# Patient Record
Sex: Female | Born: 1935
Health system: Southern US, Community
[De-identification: ages and names within clinical notes are randomized; demographics above are authoritative.]

## PROBLEM LIST (undated history)

## (undated) DIAGNOSIS — K635 Polyp of colon: Secondary | ICD-10-CM

## (undated) DIAGNOSIS — I8390 Asymptomatic varicose veins of unspecified lower extremity: Secondary | ICD-10-CM

## (undated) DIAGNOSIS — F418 Other specified anxiety disorders: Secondary | ICD-10-CM

## (undated) DIAGNOSIS — I82409 Acute embolism and thrombosis of unspecified deep veins of unspecified lower extremity: Secondary | ICD-10-CM

## (undated) DIAGNOSIS — I10 Essential (primary) hypertension: Secondary | ICD-10-CM

## (undated) DIAGNOSIS — M1710 Unilateral primary osteoarthritis, unspecified knee: Secondary | ICD-10-CM

## (undated) DIAGNOSIS — K449 Diaphragmatic hernia without obstruction or gangrene: Secondary | ICD-10-CM

## (undated) DIAGNOSIS — I2510 Atherosclerotic heart disease of native coronary artery without angina pectoris: Secondary | ICD-10-CM

## (undated) DIAGNOSIS — H9203 Otalgia, bilateral: Secondary | ICD-10-CM

## (undated) DIAGNOSIS — A044 Other intestinal Escherichia coli infections: Secondary | ICD-10-CM

## (undated) DIAGNOSIS — I639 Cerebral infarction, unspecified: Secondary | ICD-10-CM

## (undated) DIAGNOSIS — G4733 Obstructive sleep apnea (adult) (pediatric): Secondary | ICD-10-CM

## (undated) DIAGNOSIS — K219 Gastro-esophageal reflux disease without esophagitis: Secondary | ICD-10-CM

## (undated) DIAGNOSIS — E785 Hyperlipidemia, unspecified: Secondary | ICD-10-CM

## (undated) DIAGNOSIS — K5792 Diverticulitis of intestine, part unspecified, without perforation or abscess without bleeding: Secondary | ICD-10-CM

## (undated) DIAGNOSIS — T148XXA Other injury of unspecified body region, initial encounter: Secondary | ICD-10-CM

## (undated) DIAGNOSIS — R0600 Dyspnea, unspecified: Secondary | ICD-10-CM

## (undated) DIAGNOSIS — D751 Secondary polycythemia: Secondary | ICD-10-CM

## (undated) DIAGNOSIS — R51 Headache: Secondary | ICD-10-CM

## (undated) DIAGNOSIS — C801 Malignant (primary) neoplasm, unspecified: Secondary | ICD-10-CM

## (undated) DIAGNOSIS — D500 Iron deficiency anemia secondary to blood loss (chronic): Secondary | ICD-10-CM

## (undated) HISTORY — DX: Other specified anxiety disorders: F41.8

## (undated) HISTORY — PX: APPENDECTOMY: SHX54

## (undated) HISTORY — DX: Otalgia, bilateral: H92.03

## (undated) HISTORY — DX: Unilateral primary osteoarthritis, unspecified knee: M17.10

## (undated) HISTORY — DX: Obstructive sleep apnea (adult) (pediatric): G47.33

## (undated) HISTORY — DX: Malignant (primary) neoplasm, unspecified: C80.1

## (undated) HISTORY — DX: Essential (primary) hypertension: I10

## (undated) HISTORY — DX: Headache: R51

## (undated) HISTORY — DX: Diaphragmatic hernia without obstruction or gangrene: K44.9

## (undated) HISTORY — DX: Diverticulitis of intestine, part unspecified, without perforation or abscess without bleeding: K57.92

## (undated) HISTORY — DX: Atherosclerotic heart disease of native coronary artery without angina pectoris: I25.10

## (undated) HISTORY — DX: Hyperlipidemia, unspecified: E78.5

## (undated) HISTORY — PX: OTHER SURGICAL HISTORY: SHX169

## (undated) HISTORY — DX: Other injury of unspecified body region, initial encounter: T14.8XXA

## (undated) HISTORY — PX: OVARIAN CYST SURGERY: SHX726

## (undated) HISTORY — DX: Acute embolism and thrombosis of unspecified deep veins of unspecified lower extremity: I82.409

## (undated) HISTORY — DX: Cerebral infarction, unspecified: I63.9

## (undated) HISTORY — PX: BREAST CYST EXCISION: SHX579

## (undated) HISTORY — DX: Asymptomatic varicose veins of unspecified lower extremity: I83.90

## (undated) HISTORY — DX: Iron deficiency anemia secondary to blood loss (chronic): D50.0

## (undated) HISTORY — DX: Polyp of colon: K63.5

## (undated) HISTORY — DX: Gastro-esophageal reflux disease without esophagitis: K21.9

## (undated) HISTORY — PX: TONSILLECTOMY: SUR1361

## (undated) HISTORY — PX: TUBAL LIGATION: SHX77

## (undated) HISTORY — PX: BREAST BIOPSY: SHX20

## (undated) HISTORY — PX: BACK SURGERY: SHX140

---

## 1997-07-03 ENCOUNTER — Ambulatory Visit (HOSPITAL_COMMUNITY): Admission: RE | Admit: 1997-07-03 | Discharge: 1997-07-03 | Payer: Self-pay | Admitting: Obstetrics & Gynecology

## 1999-10-25 ENCOUNTER — Encounter: Admission: RE | Admit: 1999-10-25 | Discharge: 1999-11-21 | Payer: Self-pay | Admitting: Orthopedic Surgery

## 1999-11-09 ENCOUNTER — Encounter: Admission: RE | Admit: 1999-11-09 | Discharge: 1999-11-09 | Payer: Self-pay | Admitting: Orthopedic Surgery

## 1999-11-09 ENCOUNTER — Encounter: Payer: Self-pay | Admitting: Orthopedic Surgery

## 2001-06-14 DIAGNOSIS — K635 Polyp of colon: Secondary | ICD-10-CM | POA: Insufficient documentation

## 2001-06-14 HISTORY — DX: Polyp of colon: K63.5

## 2001-06-27 ENCOUNTER — Ambulatory Visit (HOSPITAL_COMMUNITY): Admission: RE | Admit: 2001-06-27 | Discharge: 2001-06-27 | Payer: Self-pay | Admitting: Gastroenterology

## 2001-06-27 ENCOUNTER — Encounter: Payer: Self-pay | Admitting: Gastroenterology

## 2001-07-07 ENCOUNTER — Encounter: Payer: Self-pay | Admitting: Gastroenterology

## 2001-07-14 ENCOUNTER — Encounter: Payer: Self-pay | Admitting: *Deleted

## 2001-07-14 ENCOUNTER — Inpatient Hospital Stay (HOSPITAL_COMMUNITY): Admission: EM | Admit: 2001-07-14 | Discharge: 2001-07-15 | Payer: Self-pay | Admitting: *Deleted

## 2002-11-19 ENCOUNTER — Encounter: Payer: Self-pay | Admitting: Internal Medicine

## 2002-12-01 ENCOUNTER — Ambulatory Visit (HOSPITAL_COMMUNITY): Admission: RE | Admit: 2002-12-01 | Discharge: 2002-12-01 | Payer: Self-pay | Admitting: Neurology

## 2003-07-21 ENCOUNTER — Encounter: Admission: RE | Admit: 2003-07-21 | Discharge: 2003-09-02 | Payer: Self-pay | Admitting: Orthopedic Surgery

## 2003-08-01 ENCOUNTER — Inpatient Hospital Stay (HOSPITAL_COMMUNITY): Admission: EM | Admit: 2003-08-01 | Discharge: 2003-08-02 | Payer: Self-pay | Admitting: Emergency Medicine

## 2003-08-12 ENCOUNTER — Ambulatory Visit (HOSPITAL_COMMUNITY): Admission: RE | Admit: 2003-08-12 | Discharge: 2003-08-12 | Payer: Self-pay | Admitting: Cardiology

## 2003-11-01 ENCOUNTER — Encounter: Admission: RE | Admit: 2003-11-01 | Discharge: 2003-11-01 | Payer: Self-pay | Admitting: Orthopedic Surgery

## 2004-01-11 IMAGING — CT CT PELVIS W/ CM
1 of 3 series · 14 of 32 positions shown, 19 images · IV contrast (omnipaque)
Comparison: none

FINDINGS
CLINICAL DATA: ABDOMINAL AND PELVIC PAIN.
CT ABDOMEN W/CONTRAST
MULTIDETECTOR HELICAL SCANS THROUGH THE ABDOMEN WERE PERFORMED AFTER ORAL AND IV CONTRAST MEDIA
WERE GIVEN.  100 CC OMNIPAQUE 300 WERE GIVEN AS THE CONTRAST MEDIA.
THE LUNG BASES ARE CLEAR.  THERE IS A MODERATE-SIZED HIATAL HERNIA PRESENT.  THE LIVER ENHANCES
NORMALLY WITH NO FOCAL ABNORMALITY AND NO DUCTAL DILATATION IS SEEN.  THE PANCREAS AND ADRENAL
GLANDS APPEAR NORMAL.  THE KIDNEYS ENHANCE WELL, AND ON DELAYED IMAGES THE PELVOCALICEAL SYSTEMS
APPEAR NORMAL.  NO ADENOPATHY IS NOTED.  THE ABDOMINAL AORTA IS NORMAL IN CALIBER.
IMPRESSION
NEGATIVE CT OF THE ABDOMEN.  MODERATE-SIZED HIATAL HERNIA.
CT PELVIS W/CONTRAST
SCANS WERE THEN CONTINUED THROUGH THE PELVIS AFTER ORAL AND IV CONTRAST MEDIA WERE GIVEN.  BY
HISTORY, THE APPENDIX HAS BEEN REMOVED.  THERE IS A LARGE AMOUNT OF PERITONEAL AND RETROPERITONEAL
FAT, BUT NO MASS EFFECT IS SEEN.  THERE ARE A FEW RECTOSIGMOID COLONIC DIVERTICULA, BUT NO
DIVERTICULITIS IS NOTED.  SMALL CALCIFIED UTERINE FIBROID IS NOTED.  THE URINARY BLADDER IS
DECOMPRESSED AND UNREMARKABLE.  NO FLUID IS SEEN WITHIN THE PELVIS.
SMALL CALCIFIED UTERINE FIBROID.  A FEW RECTOSIGMOID COLONIC DIVERTICULA.

[Series 2: abd/pelvis 5.0 b30f · axial · 0.70mm/px · z∈[-471,-101]mm · 14 of 84 slices shown, 19 images]
[im 5/84  soft-tissue]
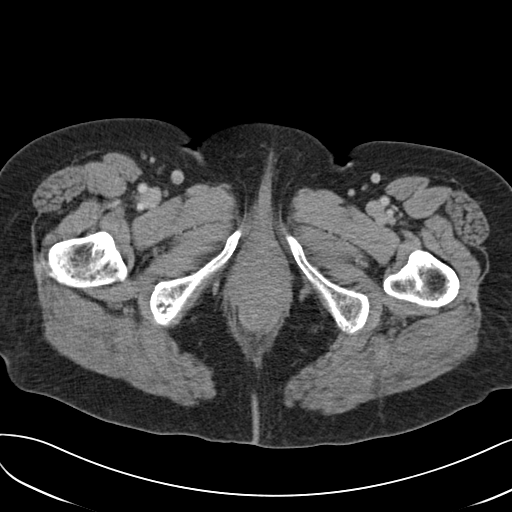
[im 5/84  bone]
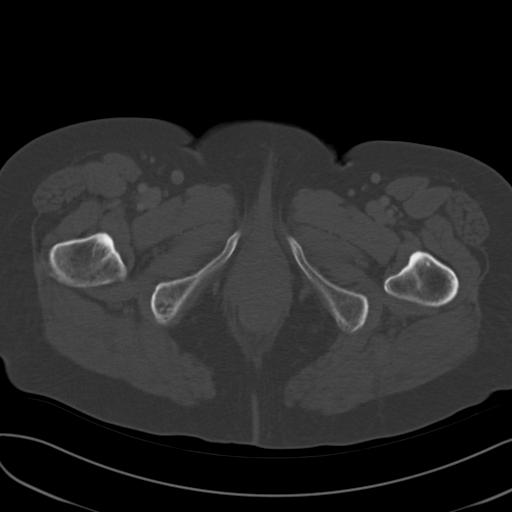
[im 10/84  soft-tissue]
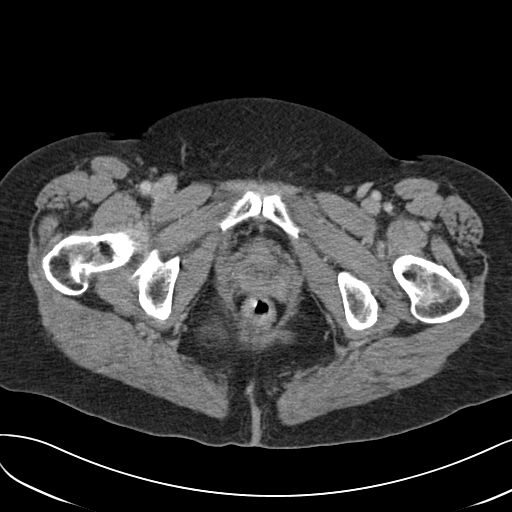
[im 20/84  soft-tissue]
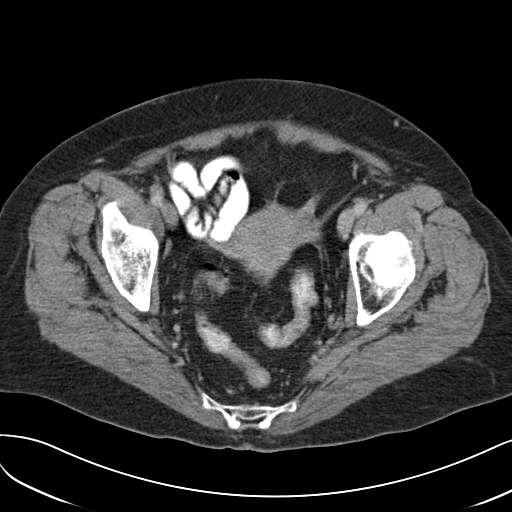
[im 25/84  soft-tissue]
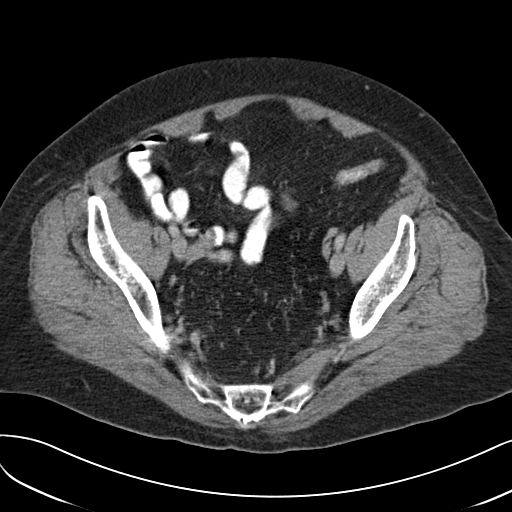
[im 30/84  soft-tissue]
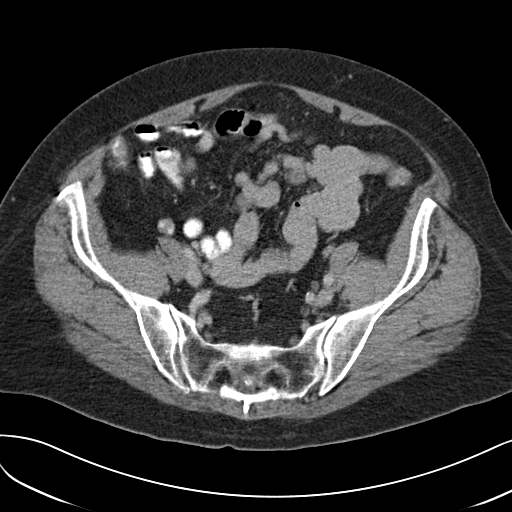
[im 35/84  soft-tissue]
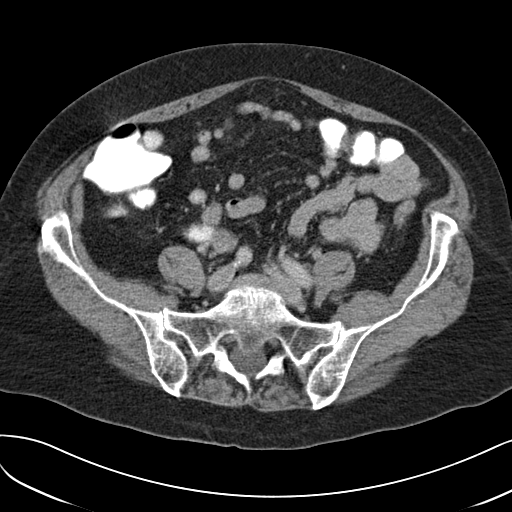
[im 44/84  soft-tissue]
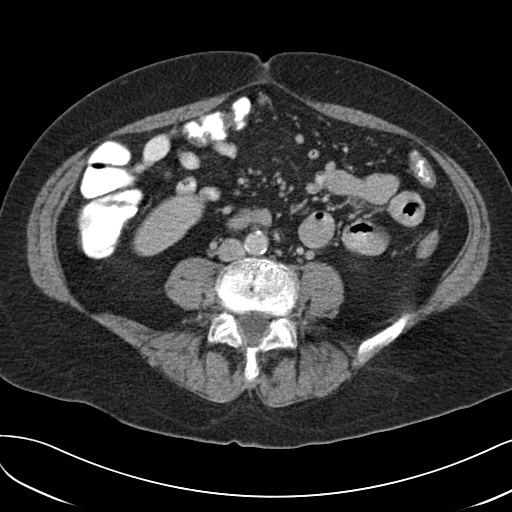
[im 49/84  soft-tissue]
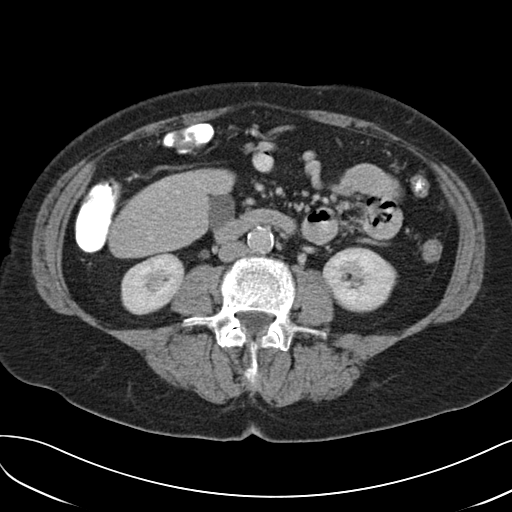
[im 54/84  soft-tissue]
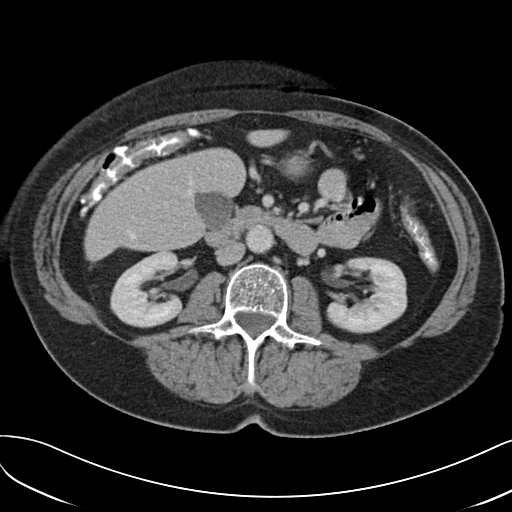
[im 54/84  bone]
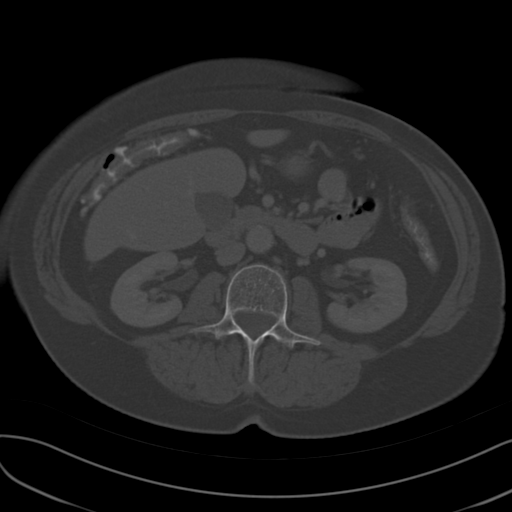
[im 59/84  soft-tissue]
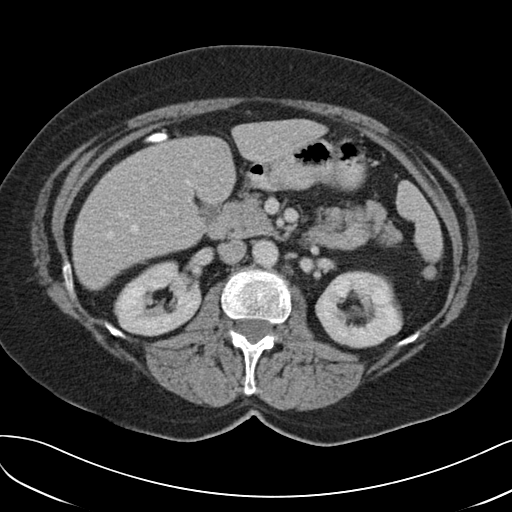
[im 64/84  soft-tissue]
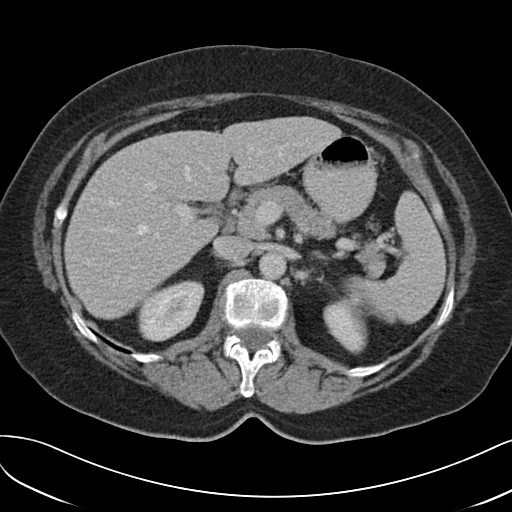
[im 64/84  lung]
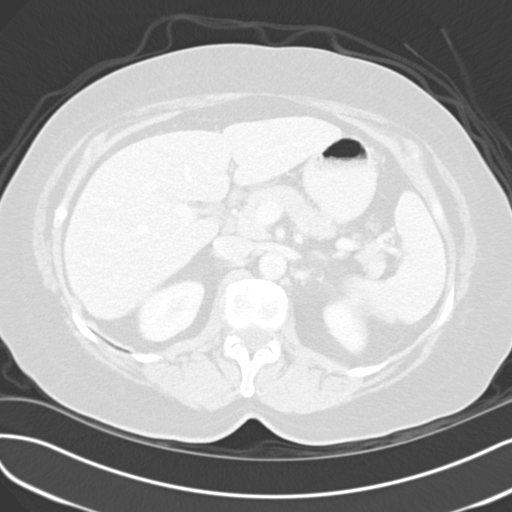
[im 69/84  lung]
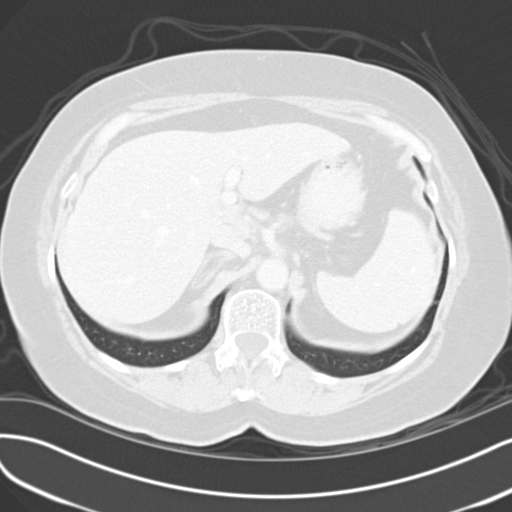
[im 74/84  soft-tissue]
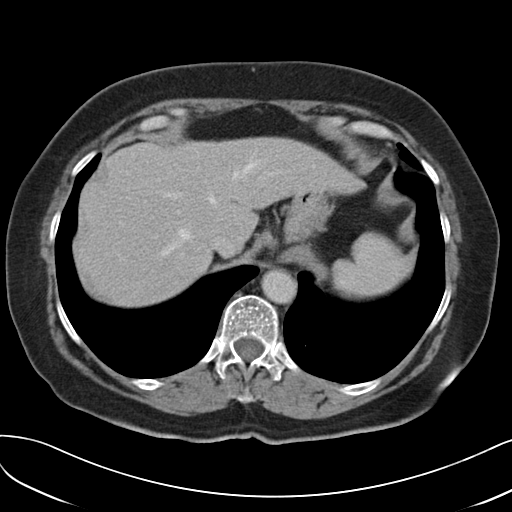
[im 74/84  lung]
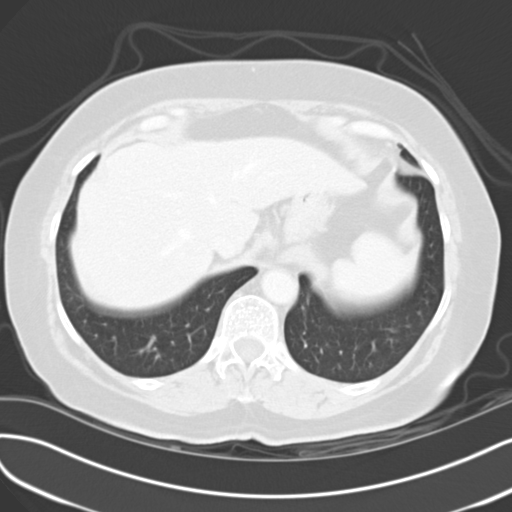
[im 79/84  soft-tissue]
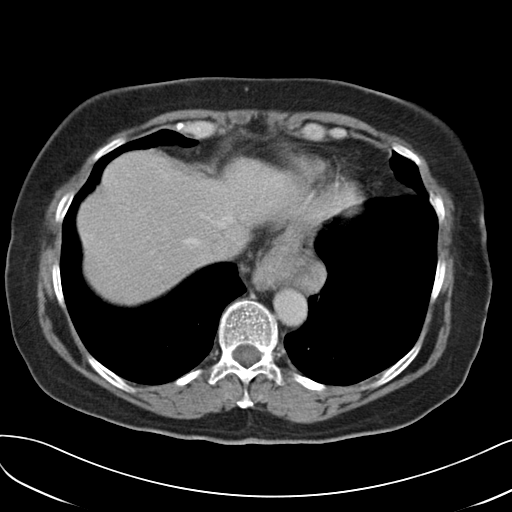
[im 79/84  lung]
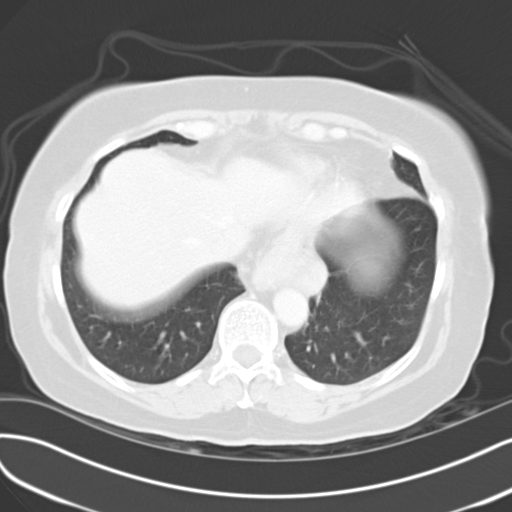

[14 of 32 positions shown; findings below may reference images not displayed]

## 2004-02-21 ENCOUNTER — Encounter: Admission: RE | Admit: 2004-02-21 | Discharge: 2004-02-21 | Payer: Self-pay | Admitting: Internal Medicine

## 2004-03-28 ENCOUNTER — Ambulatory Visit: Payer: Self-pay | Admitting: Internal Medicine

## 2004-04-10 ENCOUNTER — Ambulatory Visit: Payer: Self-pay | Admitting: Gastroenterology

## 2004-04-10 ENCOUNTER — Encounter: Payer: Self-pay | Admitting: Internal Medicine

## 2004-09-05 ENCOUNTER — Ambulatory Visit: Payer: Self-pay | Admitting: Internal Medicine

## 2004-09-19 ENCOUNTER — Ambulatory Visit: Payer: Self-pay | Admitting: Internal Medicine

## 2004-10-06 ENCOUNTER — Ambulatory Visit: Payer: Self-pay | Admitting: Internal Medicine

## 2004-11-24 ENCOUNTER — Ambulatory Visit: Payer: Self-pay | Admitting: Internal Medicine

## 2005-03-15 ENCOUNTER — Ambulatory Visit: Payer: Self-pay | Admitting: Internal Medicine

## 2005-03-23 ENCOUNTER — Ambulatory Visit: Payer: Self-pay | Admitting: Internal Medicine

## 2005-04-12 ENCOUNTER — Encounter (INDEPENDENT_AMBULATORY_CARE_PROVIDER_SITE_OTHER): Payer: Self-pay | Admitting: *Deleted

## 2005-04-18 ENCOUNTER — Ambulatory Visit (HOSPITAL_COMMUNITY): Admission: RE | Admit: 2005-04-18 | Discharge: 2005-04-18 | Payer: Self-pay | Admitting: Gastroenterology

## 2005-04-18 ENCOUNTER — Encounter (INDEPENDENT_AMBULATORY_CARE_PROVIDER_SITE_OTHER): Payer: Self-pay | Admitting: *Deleted

## 2005-05-03 ENCOUNTER — Encounter: Admission: RE | Admit: 2005-05-03 | Discharge: 2005-05-03 | Payer: Self-pay | Admitting: Orthopedic Surgery

## 2005-10-17 ENCOUNTER — Ambulatory Visit: Payer: Self-pay | Admitting: Internal Medicine

## 2005-11-07 ENCOUNTER — Ambulatory Visit: Payer: Self-pay | Admitting: Internal Medicine

## 2005-11-23 ENCOUNTER — Ambulatory Visit: Payer: Self-pay | Admitting: Internal Medicine

## 2005-12-03 ENCOUNTER — Encounter: Payer: Self-pay | Admitting: Internal Medicine

## 2006-01-07 ENCOUNTER — Ambulatory Visit: Payer: Self-pay | Admitting: Internal Medicine

## 2006-01-25 ENCOUNTER — Ambulatory Visit: Payer: Self-pay | Admitting: Internal Medicine

## 2006-02-25 IMAGING — NM NM MYOCAR PERF WALL MOTION
1 series · 6 of 6 positions shown · non-contrast
Comparison: none

CLINICAL DATA: Chest pain, 
 MYOCARDIAL PERFUSION STUDY WITH SPECT, EJECTION FRACTION CALCULATION AND WALL MOTION ANALYSIS ? 08/12/03
 No prior studies. 
 30 mCi and 10 mCi of technetium 99 Sestamibi were utilized with Persantine stress and rest imaging respectively.  
 MYOCARDIAL PERFUSION STUDY
 No significant perfusion defect is identified to suggest scar or inducible ischemia.  
 EJECTION FRACTION CALCULATION
 End diastolic volume is 54 ml.  End systolic volume is 8 ml.  Derived left ventricular resting ejection fraction is 85 percent.  This is likely over calculated due to counting statistics associated with the low end systolic volume.  
 WALL MOTION ANALYSIS
 Left ventricular wall motion and wall thickening appear normal. 
 IMPRESSION
 Normal myocardial perfusion study with left ventricular resting ejection fraction of 85 percent, likely computationally over estimated due to low end systolic volume counting statistics.

[Series 1: cr cardiolite low dose · 6.79mm/px · 6 of 64 frames shown]
[frame 6/64]
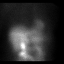
[frame 16/64]
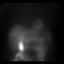
[frame 27/64]
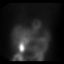
[frame 38/64]
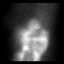
[frame 48/64]
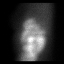
[frame 59/64]
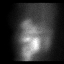

[6 of 6 positions shown; findings below may reference images not displayed]

## 2006-04-19 ENCOUNTER — Ambulatory Visit: Payer: Self-pay | Admitting: Internal Medicine

## 2006-04-22 ENCOUNTER — Ambulatory Visit: Payer: Self-pay | Admitting: Internal Medicine

## 2006-04-25 ENCOUNTER — Ambulatory Visit: Payer: Self-pay | Admitting: Internal Medicine

## 2006-05-06 ENCOUNTER — Ambulatory Visit: Payer: Self-pay | Admitting: Internal Medicine

## 2006-05-17 IMAGING — NM NM BONE 3 PHASE
8 series · 8 of 8 positions shown · non-contrast
Comparison: none

CLINICAL DATA: Left lower leg pain from the mid lower leg to the ankle for the past six months.  No known injury.  Clinical concern for the possibility of a stress fracture.  Chronic low back pain.  History of skin cancer.
THREE PHASE NUCLEAR MEDICINE BONE SCAN ? 11/01/03 
Following the intravenous administration of 25.8 mCi of Uechnetium-ZZm MDP, flow images, initial static images, and delayed static images of both lower legs were obtained.  Delayed static images of the remainder of the bony skeleton were also obtained.  
Symmetrical blood flow to both lower legs.  Small focus of increased tracer activity in the soft tissues of the proximal to mid right leg medially on the initial static images, suggesting stasis behind a venous valve.  This was not seen on the delayed static images.  Otherwise, normal tracer distribution throughout both lower legs.  
The delayed static images demonstrate markedly increased tracer uptake in the lateral aspect of the left knee with degenerative changes and sclerosis on both sides of the joint space seen on radiographs of the left lower leg obtained at [REDACTED] on 10/28/03.  Focally increased tracer uptake in the radial aspects of both wrists, greater on the right, compatible with degenerative changes.  Focally increased tracer uptake in the mid left foot laterally and in the region of the right first MTP joint, compatible with degenerative change.  Mildly increased tracer uptake in the proximal right foot and ankle, compatible with degenerative change.  Small amount of increased tracer uptake in the lower lumbar spine on the left, corresponding to degenerative spur formation on lumbar spine radiographs obtained at [REDACTED] on 10/28/03.  Normal tracer distribution throughout the remainder of the bony skeleton.  Normal renal and bladder activity.
IMPRESSION
Degenerative changes, as described above.  No evidence of stress fracture involving the left lower leg.

[bf bone flow · 2.36mm/px · 1 of 1 slices shown (1 of 4)]
[im 1/1]
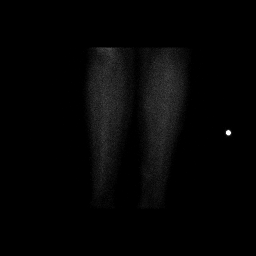

[wb whole body · 1.17mm/px · 1 of 1 slices shown (1 of 4)]
[im 1/1]
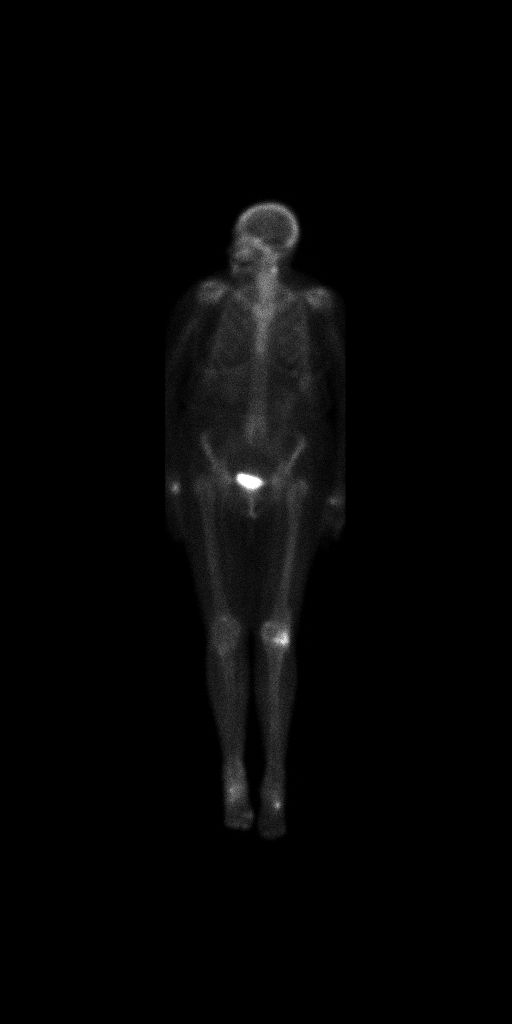

[bf bone flow · 2.33mm/px · 1 of 1 slices shown (2 of 4)]
[im 1/1]
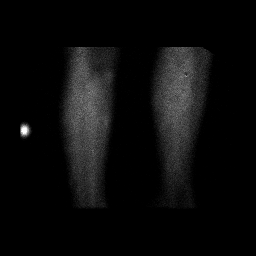

[wb whole body · 2.33mm/px · 1 of 1 slices shown (2 of 4)]
[im 1/1]
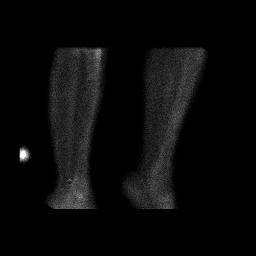

[bf bone flow · 2.36mm/px · 1 of 1 slices shown (3 of 4)]
[im 1/1]
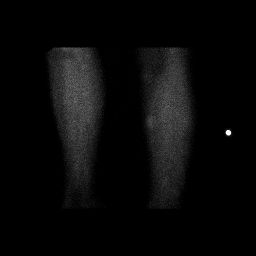

[bf bone flow · 2.33mm/px · 1 of 1 slices shown (4 of 4)]
[im 1/1]
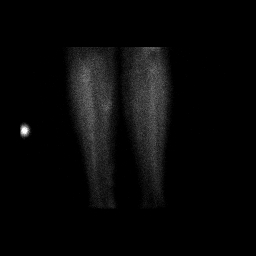

[wb whole body · 1.18mm/px · 1 of 1 slices shown (3 of 4)]
[im 1/1]
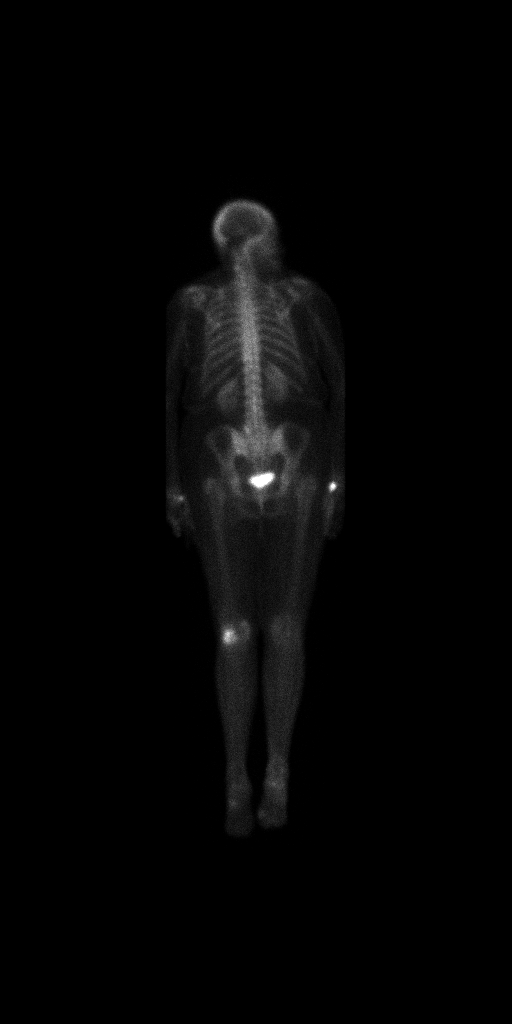

[wb whole body · 2.36mm/px · 1 of 1 slices shown (4 of 4)]
[im 1/1]
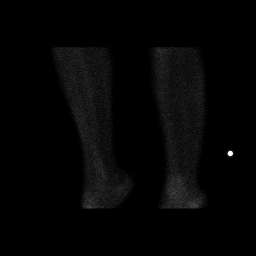

[8 of 8 positions shown; findings below may reference images not displayed]

## 2006-05-21 ENCOUNTER — Ambulatory Visit: Payer: Self-pay | Admitting: Internal Medicine

## 2006-06-18 ENCOUNTER — Ambulatory Visit: Payer: Self-pay | Admitting: Internal Medicine

## 2006-07-05 ENCOUNTER — Emergency Department (HOSPITAL_COMMUNITY): Admission: EM | Admit: 2006-07-05 | Discharge: 2006-07-05 | Payer: Self-pay | Admitting: *Deleted

## 2006-07-10 ENCOUNTER — Ambulatory Visit: Payer: Self-pay | Admitting: Internal Medicine

## 2006-07-31 ENCOUNTER — Ambulatory Visit: Payer: Self-pay | Admitting: Internal Medicine

## 2006-09-02 ENCOUNTER — Ambulatory Visit: Payer: Self-pay | Admitting: Internal Medicine

## 2006-09-06 IMAGING — CT CT ABDOMEN W/ CM
1 of 2 series · 15 of 32 positions shown, 19 images · IV contrast (GASTROGRAFIN & [ID] OMNI 300)
Comparison: none

CLINICAL DATA: Abdominal pain, particularly left lower quadrant.  
 CT ABDOMEN WITH CONTRAST AND CT PELVIS WITH CONTRAST 
 CT ABDOMEN 
 Multidetector helical CT scans through the abdomen were performed after oral and IV contrast media were given.  125 cc of Omnipaque 300 were given as the contrast media.  This scan is compared to prior CT of 06/27/01.
 A moderate sized hiatal hernia is again noted.  The liver enhances normally with no focal abnormality and no ductal dilatation is seen.  The pancreas is normal in size with normal peripancreatic fat planes.  The adrenal glands are stable, as is the spleen with several calcified splenic granuloma noted.  The kidneys enhance normally and on delayed images the pelvocaliceal systems are normal.  The proximal ureters are normal in caliber.  The abdominal aorta appears normal. 
 IMPRESSION
 1.  No acute abnormality on CT of the pelvis. 
 2.  Moderate sized hiatal hernia is again noted.
 CT PELVIS 
 Scans were continued through the pelvis after oral and IV contrast media were given and compared to the prior study. There are scattered sigmoid colonic diverticula present but no diverticulitis is seen.  The uterus is unchanged in size with probable calcified uterine fibroids to the left of midline.  The ovaries are unchanged in size.  No free fluid is seen within the pelvis.  The urinary bladder is unremarkable.  There are degenerative changes involving the facet joints of the lower lumbar spine.
 1.  Scattered diverticula in the rectosigmoid colon.  No diverticulitis.  
 2.  Stable appearance of the uterus and ovaries.  No free fluid.

[Series 2: a&p w/ · axial · 0.70mm/px · z∈[-401,-51]mm · 15 of 77 slices shown, 19 images]
[im 4/77  soft-tissue]
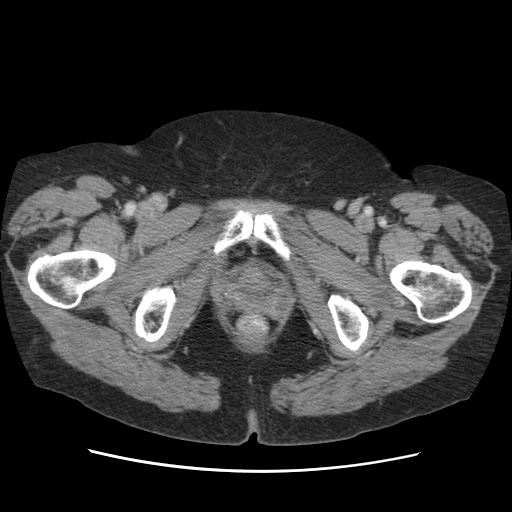
[im 4/77  bone]
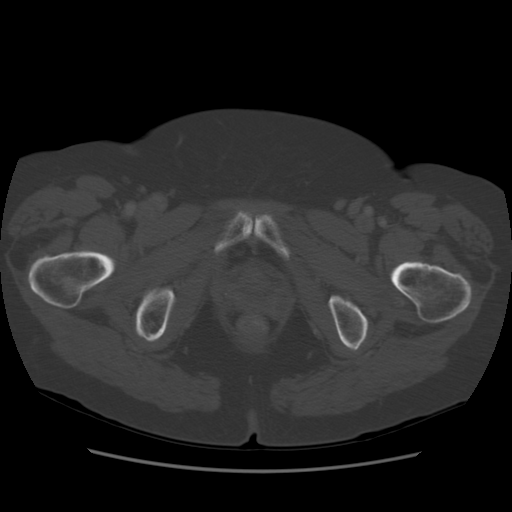
[im 10/77  soft-tissue]
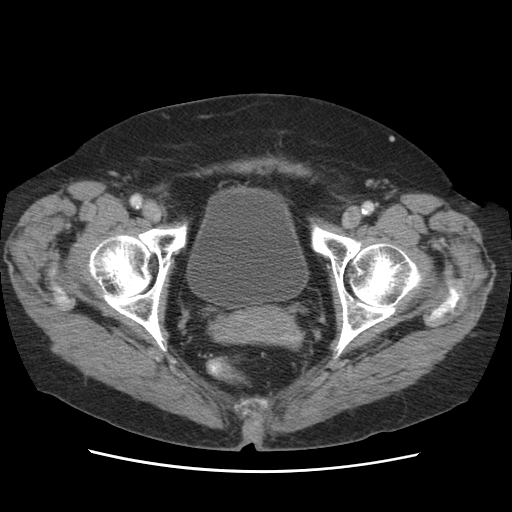
[im 17/77  soft-tissue]
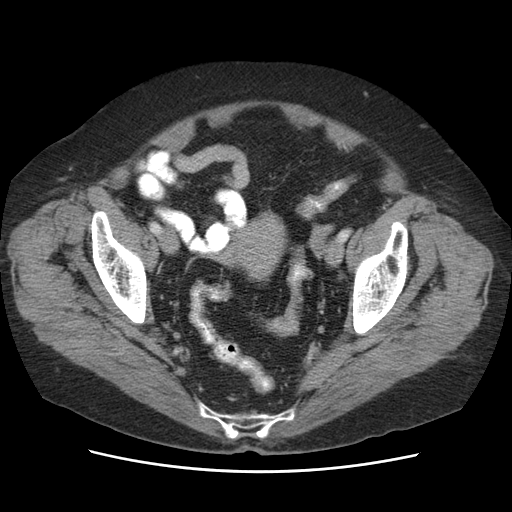
[im 20/77  soft-tissue]
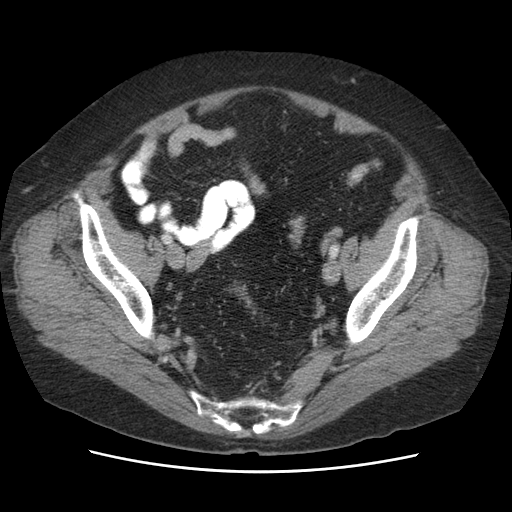
[im 27/77  soft-tissue]
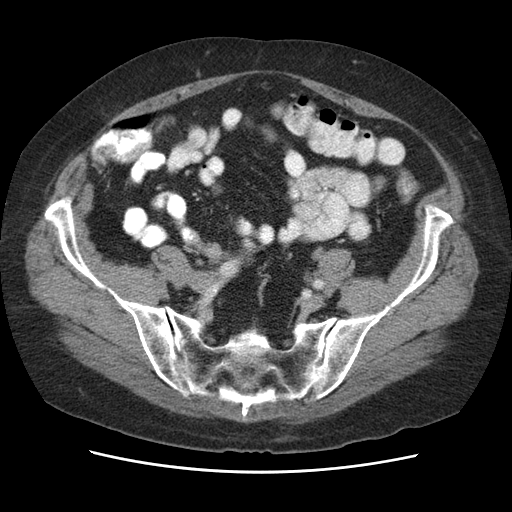
[im 34/77  soft-tissue]
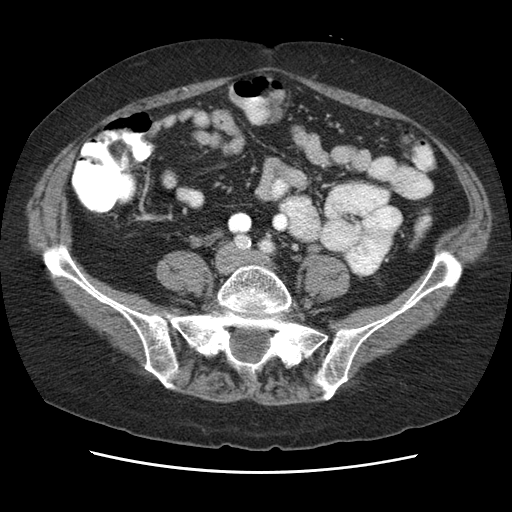
[im 40/77  soft-tissue]
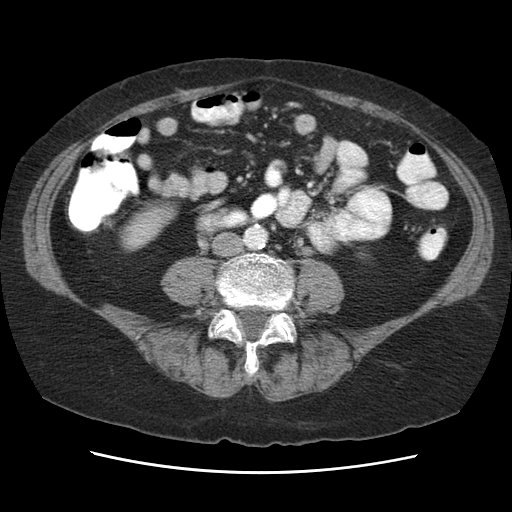
[im 43/77  soft-tissue]
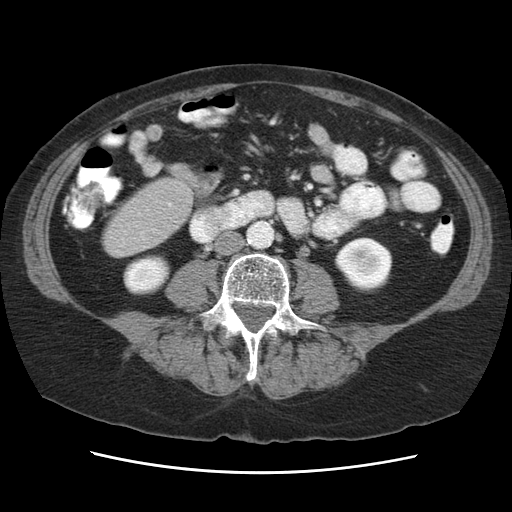
[im 50/77  soft-tissue]
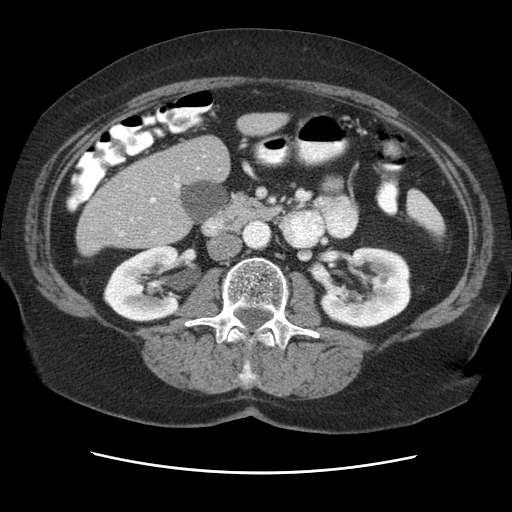
[im 50/77  bone]
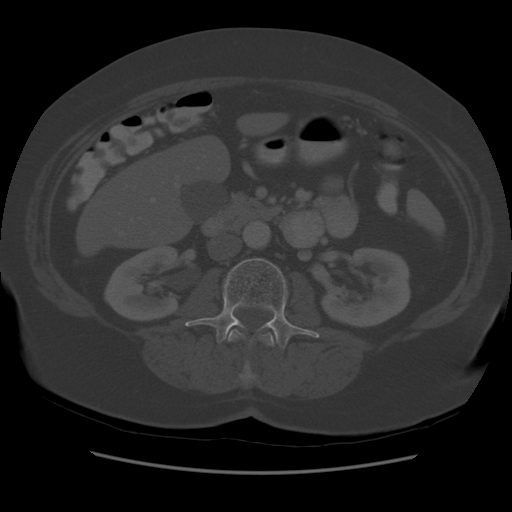
[im 57/77  soft-tissue]
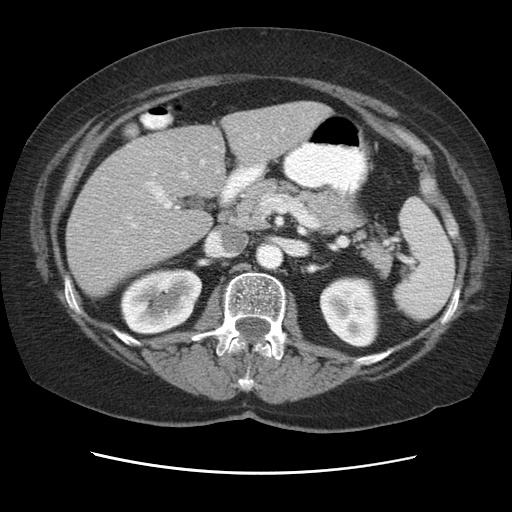
[im 60/77  soft-tissue]
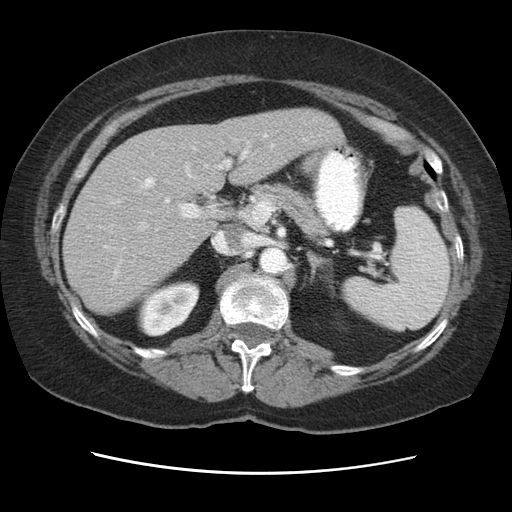
[im 63/77  lung]
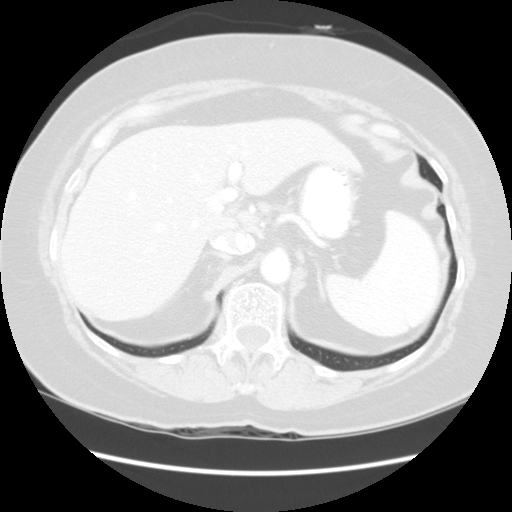
[im 67/77  soft-tissue]
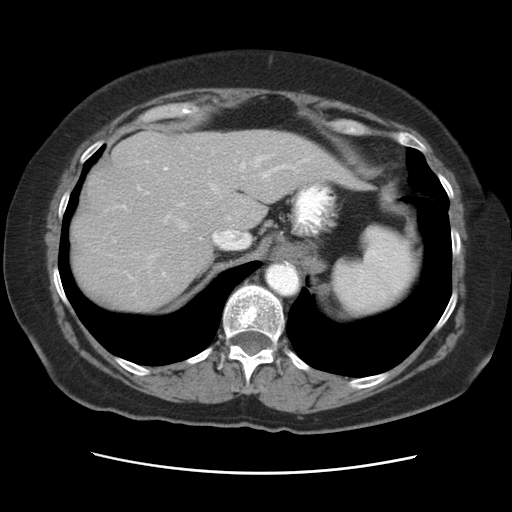
[im 67/77  lung]
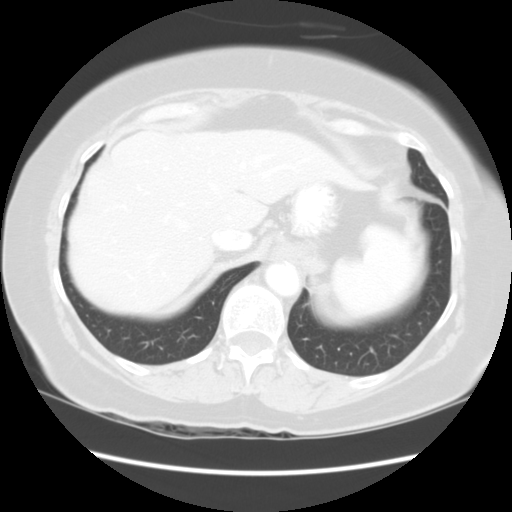
[im 70/77  lung]
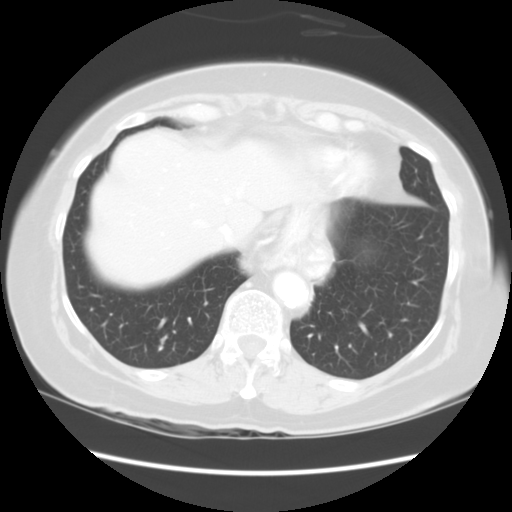
[im 73/77  soft-tissue]
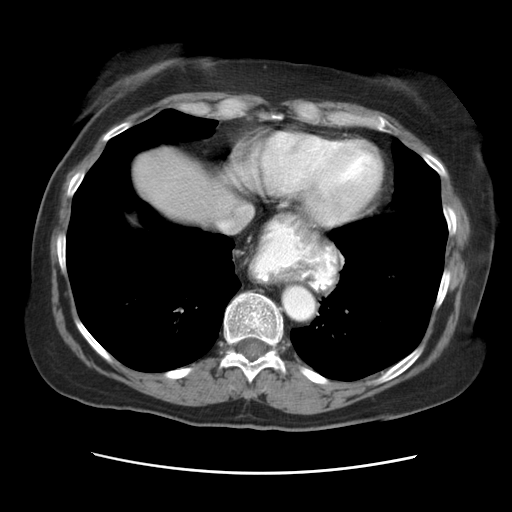
[im 73/77  lung]
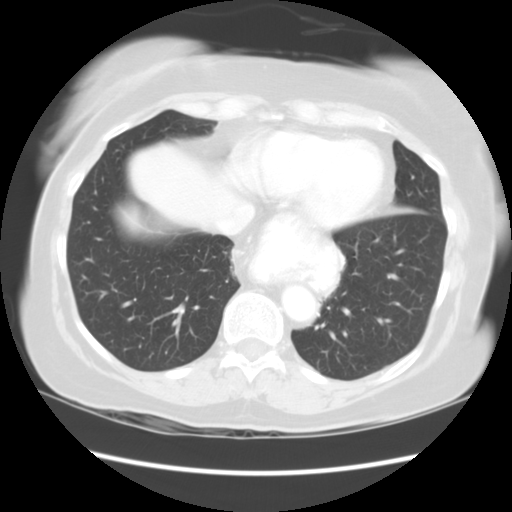

[15 of 32 positions shown; findings below may reference images not displayed]

## 2006-09-30 DIAGNOSIS — I10 Essential (primary) hypertension: Secondary | ICD-10-CM | POA: Insufficient documentation

## 2006-09-30 DIAGNOSIS — G43109 Migraine with aura, not intractable, without status migrainosus: Secondary | ICD-10-CM | POA: Insufficient documentation

## 2006-09-30 DIAGNOSIS — M810 Age-related osteoporosis without current pathological fracture: Secondary | ICD-10-CM | POA: Insufficient documentation

## 2006-12-03 ENCOUNTER — Ambulatory Visit: Payer: Self-pay | Admitting: Vascular Surgery

## 2006-12-25 ENCOUNTER — Telehealth (INDEPENDENT_AMBULATORY_CARE_PROVIDER_SITE_OTHER): Payer: Self-pay | Admitting: *Deleted

## 2006-12-30 ENCOUNTER — Ambulatory Visit: Payer: Self-pay | Admitting: Internal Medicine

## 2006-12-30 LAB — CONVERTED CEMR LAB
Bilirubin Urine: NEGATIVE
Blood in Urine, dipstick: NEGATIVE
Glucose, Urine, Semiquant: NEGATIVE
Ketones, urine, test strip: NEGATIVE
Nitrite: NEGATIVE
Protein, U semiquant: NEGATIVE
Specific Gravity, Urine: 1.01
Urobilinogen, UA: NEGATIVE
WBC Urine, dipstick: NEGATIVE
pH: 5

## 2007-01-02 ENCOUNTER — Ambulatory Visit (HOSPITAL_COMMUNITY): Admission: RE | Admit: 2007-01-02 | Discharge: 2007-01-02 | Payer: Self-pay | Admitting: Internal Medicine

## 2007-01-27 ENCOUNTER — Encounter (INDEPENDENT_AMBULATORY_CARE_PROVIDER_SITE_OTHER): Payer: Self-pay | Admitting: *Deleted

## 2007-03-18 ENCOUNTER — Encounter: Payer: Self-pay | Admitting: Internal Medicine

## 2007-04-22 ENCOUNTER — Encounter: Payer: Self-pay | Admitting: Internal Medicine

## 2007-06-25 ENCOUNTER — Ambulatory Visit: Payer: Self-pay | Admitting: Internal Medicine

## 2007-06-25 DIAGNOSIS — I5181 Takotsubo syndrome: Secondary | ICD-10-CM | POA: Insufficient documentation

## 2007-06-25 DIAGNOSIS — K219 Gastro-esophageal reflux disease without esophagitis: Secondary | ICD-10-CM | POA: Insufficient documentation

## 2007-06-25 DIAGNOSIS — E785 Hyperlipidemia, unspecified: Secondary | ICD-10-CM | POA: Insufficient documentation

## 2007-06-25 DIAGNOSIS — Z8679 Personal history of other diseases of the circulatory system: Secondary | ICD-10-CM | POA: Insufficient documentation

## 2007-06-27 ENCOUNTER — Telehealth (INDEPENDENT_AMBULATORY_CARE_PROVIDER_SITE_OTHER): Payer: Self-pay | Admitting: *Deleted

## 2007-07-02 LAB — CONVERTED CEMR LAB
ALT: 16 units/L (ref 0–35)
AST: 23 units/L (ref 0–37)
BUN: 7 mg/dL (ref 6–23)
Basophils Absolute: 0.1 10*3/uL (ref 0.0–0.1)
Basophils Relative: 0.9 % (ref 0.0–1.0)
CO2: 29 meq/L (ref 19–32)
Calcium: 9.4 mg/dL (ref 8.4–10.5)
Chloride: 104 meq/L (ref 96–112)
Cholesterol: 221 mg/dL (ref 0–200)
Creatinine, Ser: 0.8 mg/dL (ref 0.4–1.2)
Direct LDL: 161 mg/dL
Eosinophils Absolute: 0.3 10*3/uL (ref 0.0–0.6)
Eosinophils Relative: 4.6 % (ref 0.0–5.0)
Folate: 14.5 ng/mL
GFR calc Af Amer: 90 mL/min
GFR calc non Af Amer: 74 mL/min
Glucose, Bld: 97 mg/dL (ref 70–99)
HCT: 51.8 % — ABNORMAL HIGH (ref 36.0–46.0)
HDL: 46.9 mg/dL (ref >39.0)
Hemoglobin: 16.5 g/dL — ABNORMAL HIGH (ref 12.0–15.0)
Lymphocytes Relative: 22.6 % (ref 12.0–46.0)
MCHC: 32 g/dL (ref 30.0–36.0)
MCV: 88.9 fL (ref 78.0–100.0)
Monocytes Absolute: 0.4 10*3/uL (ref 0.2–0.7)
Monocytes Relative: 6.6 % (ref 3.0–11.0)
Neutro Abs: 4.2 10*3/uL (ref 1.4–7.7)
Neutrophils Relative %: 65.3 % (ref 43.0–77.0)
Platelets: 459 10*3/uL — ABNORMAL HIGH (ref 150–400)
Potassium: 4.6 meq/L (ref 3.5–5.1)
RBC: 5.82 M/uL — ABNORMAL HIGH (ref 3.87–5.11)
RDW: 12.9 % (ref 11.5–14.6)
Sodium: 138 meq/L (ref 135–145)
TSH: 2.55 microintl units/mL (ref 0.35–5.50)
Total CHOL/HDL Ratio: 4.7
Triglycerides: 83 mg/dL (ref 0–149)
VLDL: 17 mg/dL (ref 0–40)
Vitamin B-12: 401 pg/mL (ref 211–911)
WBC: 6.4 10*3/uL (ref 4.5–10.5)

## 2007-08-13 HISTORY — PX: KYPHOSIS SURGERY: SHX114

## 2007-08-28 ENCOUNTER — Encounter: Admission: RE | Admit: 2007-08-28 | Discharge: 2007-08-28 | Payer: Self-pay | Admitting: Family Medicine

## 2007-09-06 ENCOUNTER — Emergency Department (HOSPITAL_COMMUNITY): Admission: EM | Admit: 2007-09-06 | Discharge: 2007-09-07 | Payer: Self-pay | Admitting: Emergency Medicine

## 2007-09-07 ENCOUNTER — Inpatient Hospital Stay (HOSPITAL_COMMUNITY): Admission: EM | Admit: 2007-09-07 | Discharge: 2007-09-15 | Payer: Self-pay | Admitting: Emergency Medicine

## 2007-09-07 ENCOUNTER — Ambulatory Visit: Payer: Self-pay | Admitting: Internal Medicine

## 2007-09-07 ENCOUNTER — Ambulatory Visit: Payer: Self-pay | Admitting: Cardiology

## 2007-09-08 ENCOUNTER — Encounter: Payer: Self-pay | Admitting: Internal Medicine

## 2007-09-08 ENCOUNTER — Telehealth: Payer: Self-pay | Admitting: Internal Medicine

## 2007-09-08 ENCOUNTER — Encounter (INDEPENDENT_AMBULATORY_CARE_PROVIDER_SITE_OTHER): Payer: Self-pay | Admitting: Emergency Medicine

## 2007-09-08 ENCOUNTER — Telehealth: Payer: Self-pay | Admitting: Family Medicine

## 2007-09-08 ENCOUNTER — Ambulatory Visit: Payer: Self-pay | Admitting: Vascular Surgery

## 2007-09-10 ENCOUNTER — Encounter: Payer: Self-pay | Admitting: Internal Medicine

## 2007-09-10 DIAGNOSIS — T148XXA Other injury of unspecified body region, initial encounter: Secondary | ICD-10-CM

## 2007-09-10 HISTORY — DX: Other injury of unspecified body region, initial encounter: T14.8XXA

## 2007-09-11 ENCOUNTER — Telehealth: Payer: Self-pay | Admitting: Internal Medicine

## 2007-10-08 ENCOUNTER — Ambulatory Visit: Payer: Self-pay | Admitting: Internal Medicine

## 2007-10-08 DIAGNOSIS — IMO0002 Reserved for concepts with insufficient information to code with codable children: Secondary | ICD-10-CM | POA: Insufficient documentation

## 2007-10-08 DIAGNOSIS — F341 Dysthymic disorder: Secondary | ICD-10-CM | POA: Insufficient documentation

## 2007-10-14 ENCOUNTER — Telehealth: Payer: Self-pay | Admitting: Internal Medicine

## 2007-10-17 ENCOUNTER — Encounter: Payer: Self-pay | Admitting: Internal Medicine

## 2007-10-18 ENCOUNTER — Encounter: Payer: Self-pay | Admitting: Internal Medicine

## 2007-10-20 ENCOUNTER — Ambulatory Visit: Payer: Self-pay | Admitting: Internal Medicine

## 2007-10-21 ENCOUNTER — Telehealth: Payer: Self-pay | Admitting: Internal Medicine

## 2007-10-21 ENCOUNTER — Encounter: Payer: Self-pay | Admitting: Internal Medicine

## 2007-10-21 DIAGNOSIS — R269 Unspecified abnormalities of gait and mobility: Secondary | ICD-10-CM | POA: Insufficient documentation

## 2007-11-02 IMAGING — RF DG UGI W/ HIGH DENSITY W/KUB
18 of 23 series · 18 of 23 positions shown · non-contrast
Comparison: none

CLINICAL DATA: Reflux, question stricture.  Choking sensation when swallowing.  
DOUBLE CONTRAST UPPER GI SERIES WITH KUB:

[Series 1: run · 1 of 1 slices shown (1 of 17)]
[im 1/1]
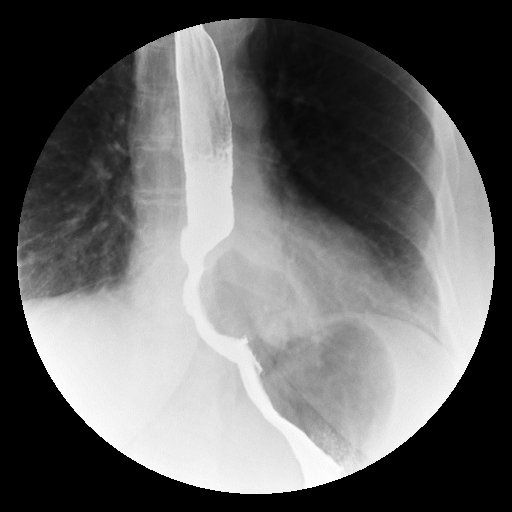

[Series 2: run · 1 of 1 slices shown (2 of 17)]
[im 1/1]
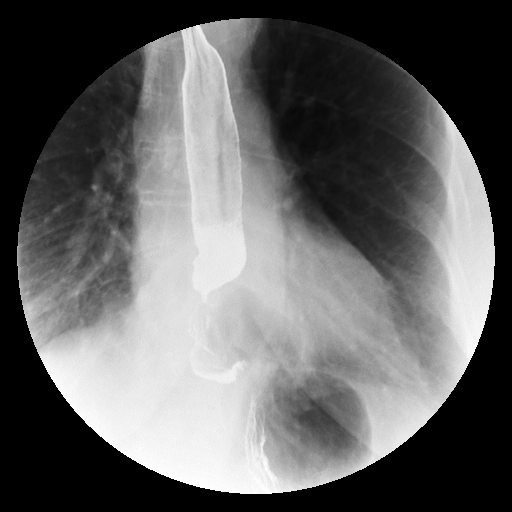

[Series 4: run · 1 of 1 slices shown (3 of 17)]
[im 1/1]
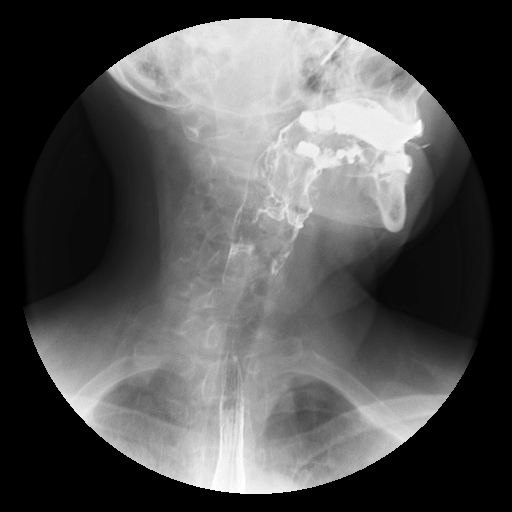

[Series 5: run · 1 of 1 slices shown (4 of 17)]
[im 1/1]
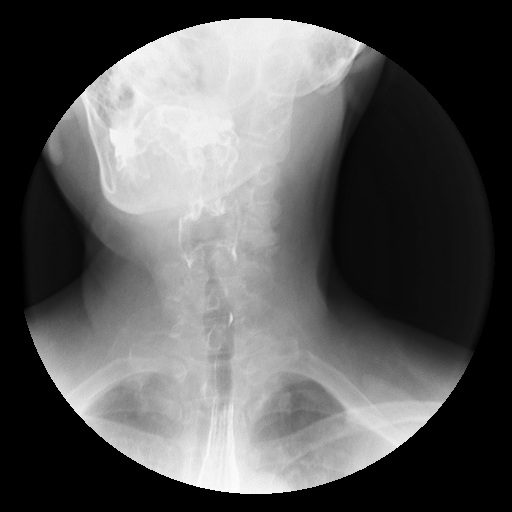

[Series 6: run · 1 of 1 slices shown (5 of 17)]
[im 1/1]
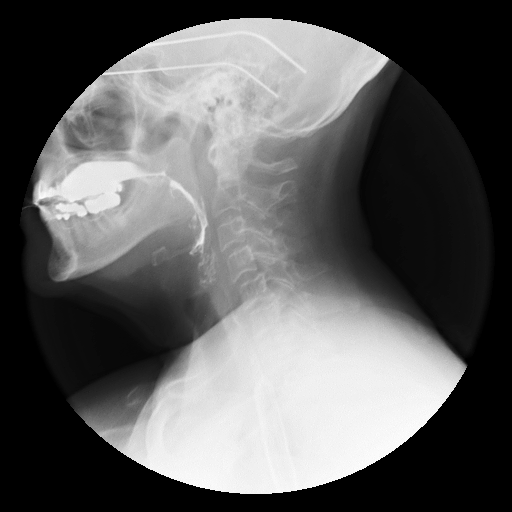

[Series 8: run · 1 of 1 slices shown (6 of 17)]
[im 1/1]
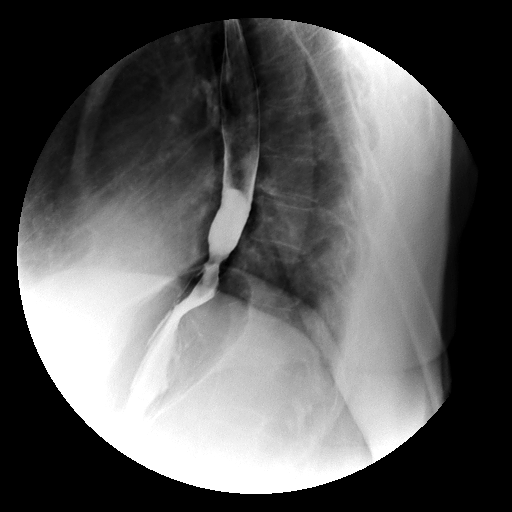

[Series 9: run · 1 of 1 slices shown (7 of 17)]
[im 1/1]
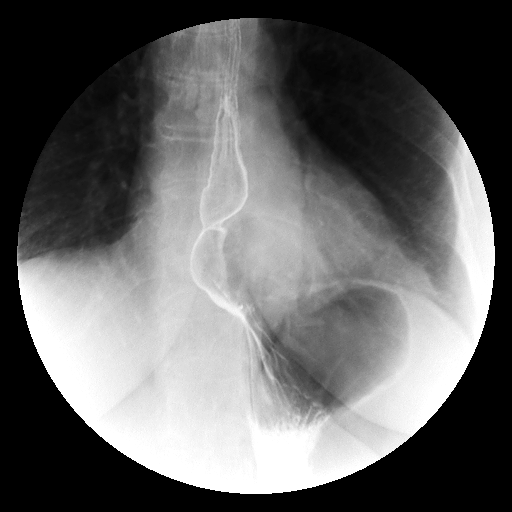

[Series 10: run · 1 of 1 slices shown (8 of 17)]
[im 1/1]
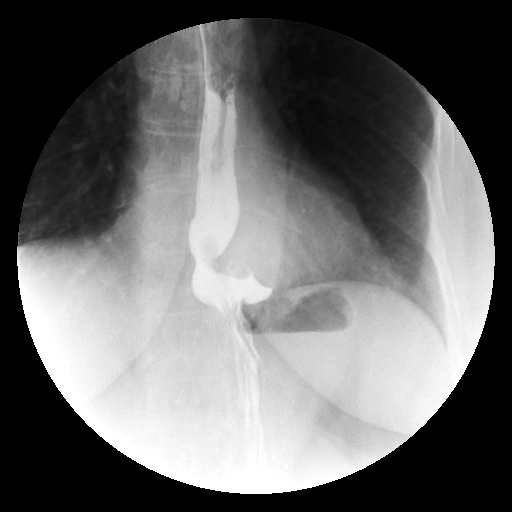

[Series 11: run · 1 of 1 slices shown (9 of 17)]
[im 1/1]
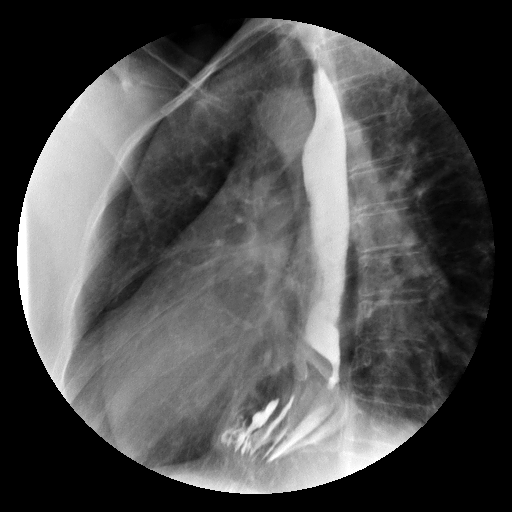

[Series 13: run · 1 of 1 slices shown (10 of 17)]
[im 1/1]
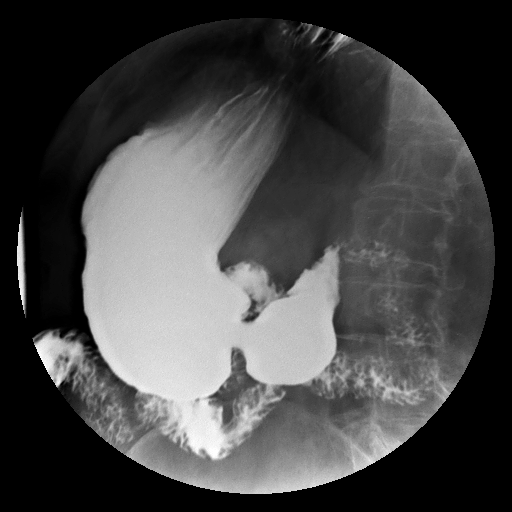

[Series 14: run · 1 of 1 slices shown (11 of 17)]
[im 1/1]
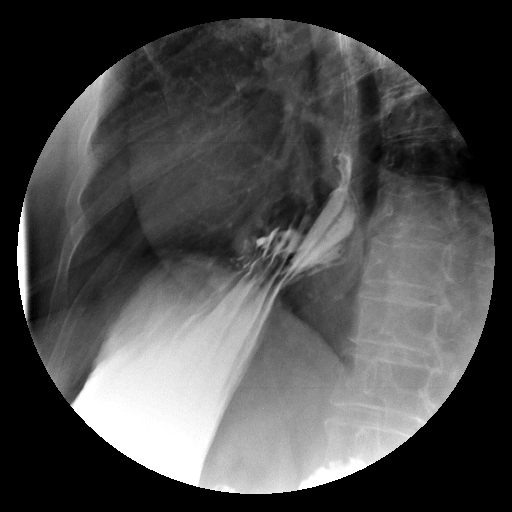

[Series 15: run · 1 of 1 slices shown (12 of 17)]
[im 1/1]
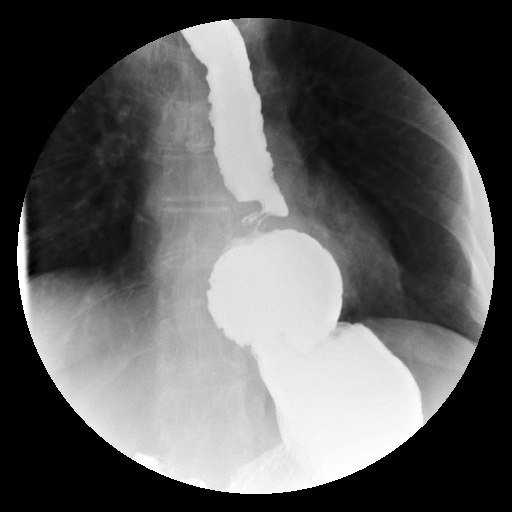

[Series 16: run · 1 of 1 slices shown (13 of 17)]
[im 1/1]
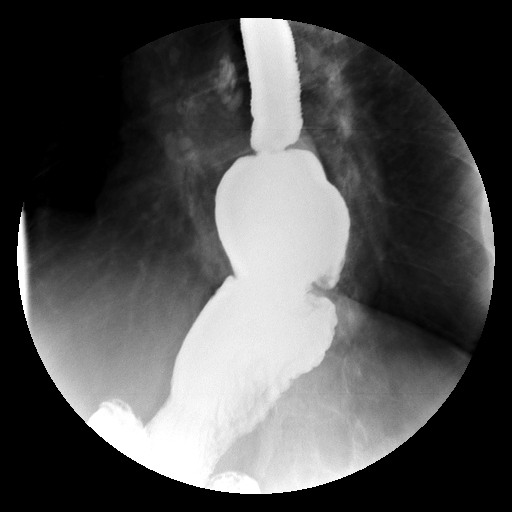

[Series 18: run · 1 of 1 slices shown (14 of 17)]
[im 1/1]
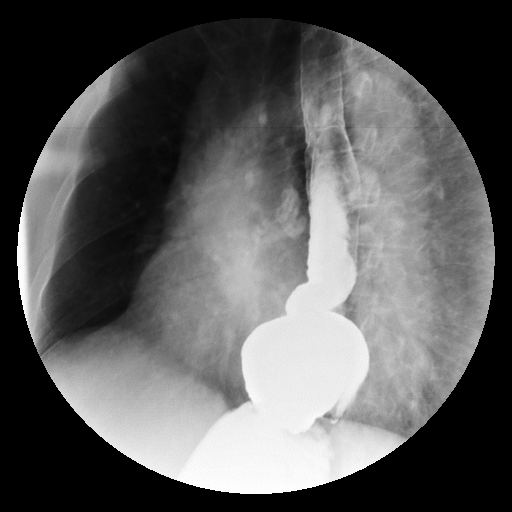

[Series 19: run · 1 of 1 slices shown (15 of 17)]
[im 1/1]
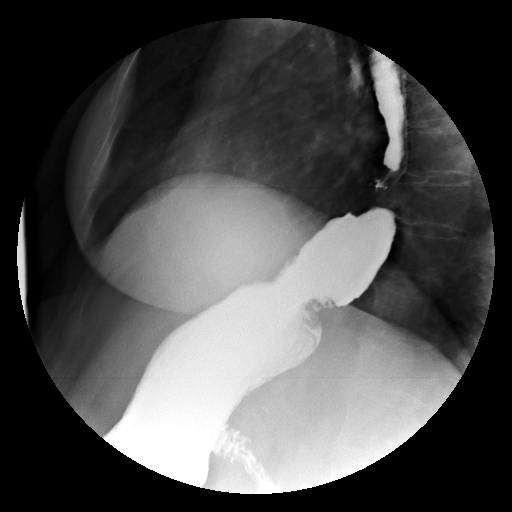

[Series 20: run · 1 of 1 slices shown (16 of 17)]
[im 1/1]
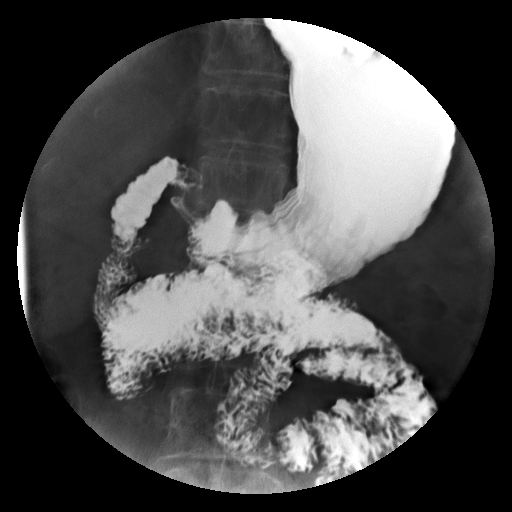

[Series 22: run · 1 of 1 slices shown (17 of 17)]
[im 1/1]
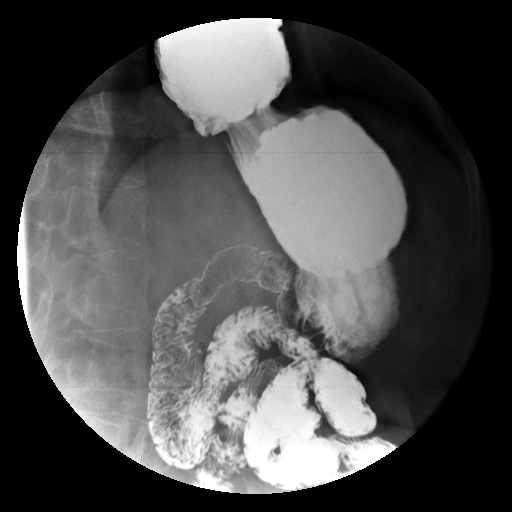

[Series 1001: view not recorded · 0.20mm/px · 1 of 1 slices shown]
[im 1/1]
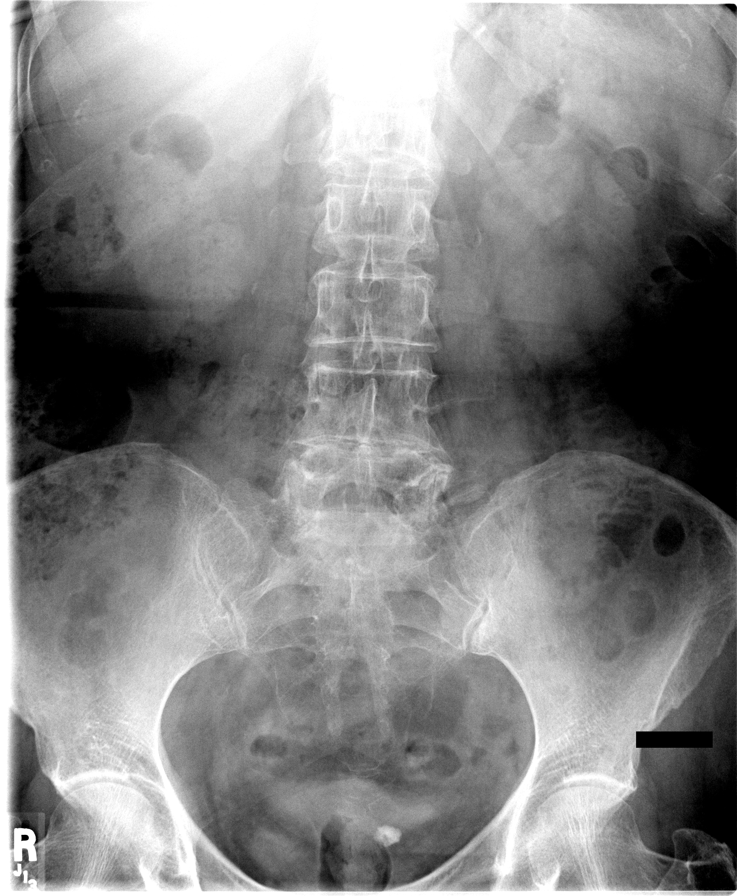

[18 of 23 positions shown; findings below may reference images not displayed]

FINDINGS: Preliminary scout film is negative.  The patient is studied in the erect and recumbent positions while swallowing thin and thick liquid forms of barium.  Multiple spot films were obtained under fluoroscopic control.  
Vallecula and piriform sinuses appear symmetrical.  Swallowing function appeared normal.  No penetration or aspiration of barium.  Incidentally in the lateral projection, there is evidence of mild anterior subluxation of C4 on C5 by 2.5-3.0 mm, likely due to the degenerative facet arthropathic changes which are noted at C4-5.  
Primary stripping wave of the esophagus is diminished.  Smit tertiary contractions.  o stricture.  The patient swallowed a 13 mm tablet in the erect position, which traversed the esophagus and entered the stomach without delay.  omewhat complex appearing hiatal hernia with sliding and para-esophageal components.  There was gastroesophageal reflux.  No gastric or duodenal ulcer is detected.
IMPRESSION: Hiatal hernia with sliding and para-esophageal components.  Diminished primary stripping wave and tertiary contractions, i.e., nonspecific esophageal motility disorder.  No stricture.

## 2007-11-07 ENCOUNTER — Encounter: Payer: Self-pay | Admitting: Internal Medicine

## 2007-11-17 IMAGING — MR MR CERVICAL SPINE W/O CM
4 of 6 series · 24 of 48 positions shown · IV contrast (agent unspecified)
Comparison: MRI 11/09/99.

CLINICAL DATA: Neck and left jaw pain.  
MRI CERVICAL SPINE W/O CONTRAST:
TECHNIQUE: Multiplanar and multiecho pulse sequences of the cervical spine, to include the base of the skull and upper thoracic region, were obtained according to standard protocol without IV contrast.
There is straightening of the cervical spine with mild kyphosis present at C5-6.  This is similar to the prior MRI.  Spinal canal was generous in size and the cord has normal signal.  There is no fracture.  
Note is made of enlargement of the distal right vertebral artery and the basilar artery.  The basilar artery is ectatic.  This was seen on the prior study but has progressed.  This is most likely due to atherosclerotic disease with enlargement of the basilar and vertebral artery which is not fully evaluated on this study.  The basilar does appear to be indenting the right side of the medulla.  No brain scans are available for correlation.  
C2-3:  Very small central disc protrusion. 
C3-4:  Negative. 
C4-5:  Negative. 
C5-6:  Disc bulging and central and left sided spurring without cord deformity.  There is some mild left foraminal encroachment.  Spurring at this level has progressed since the prior MRI. 
C6-7:  Mild disc degeneration. 
C7-T1:  1 to 2mm of anterior slip are present with disc mild disc degeneration.

[Series 4: T1 · sagittal · 3.0mm · 0.43mm/px · 6 of 11 slices shown]
[im 1/11]
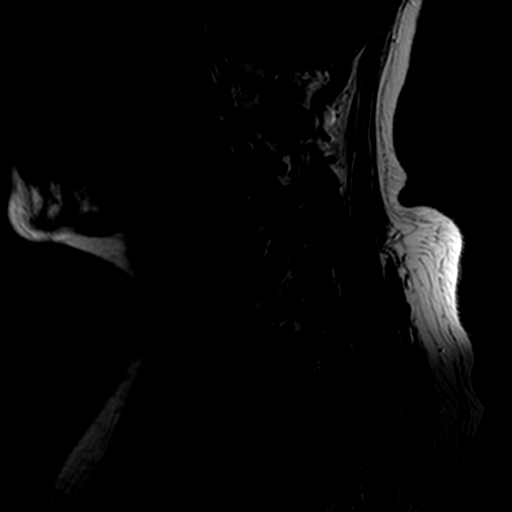
[im 3/11]
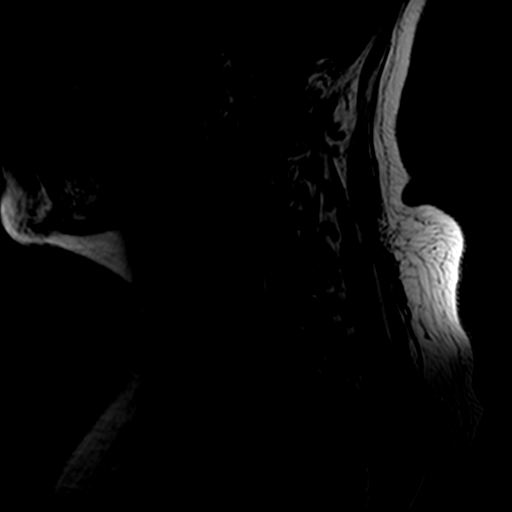
[im 5/11]
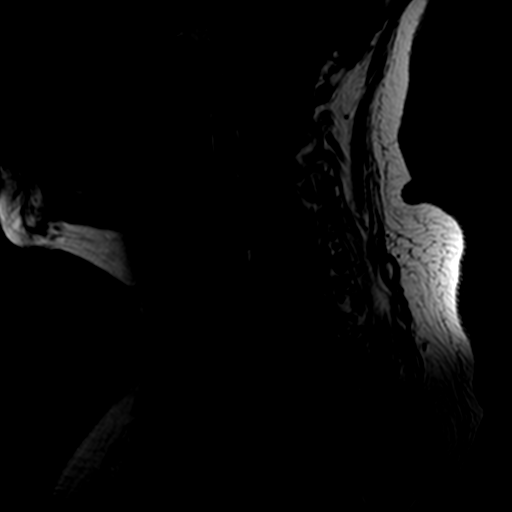
[im 7/11]
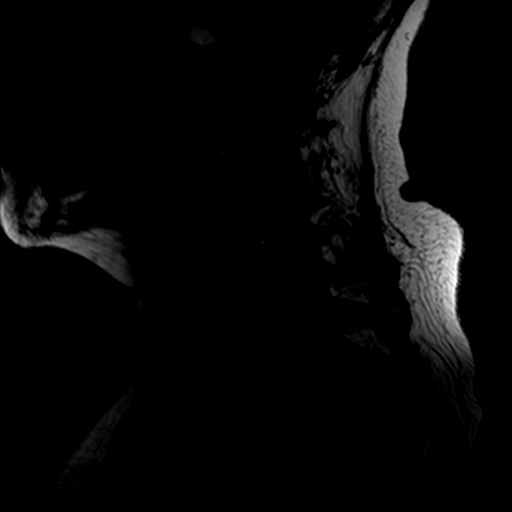
[im 9/11]
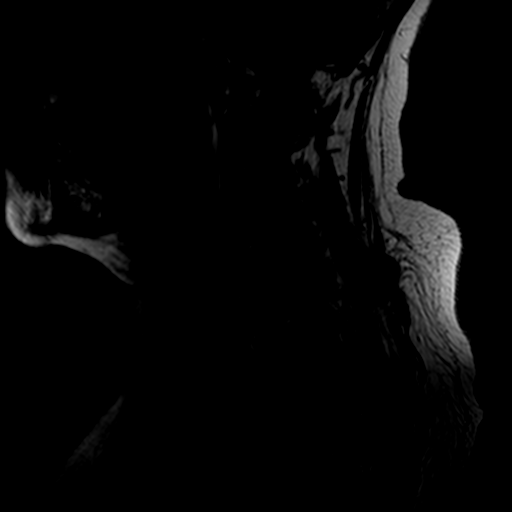
[im 11/11]
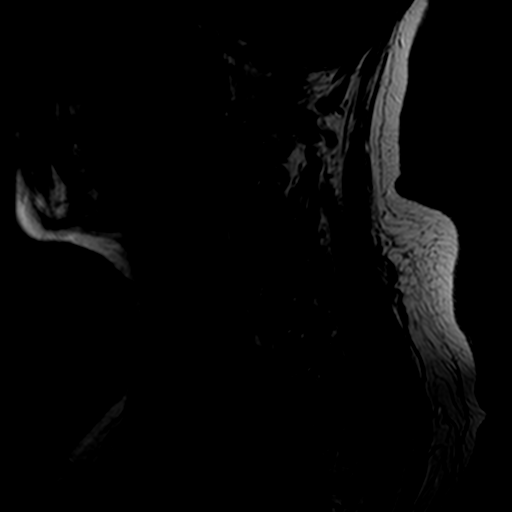

[Series 6: STIR · sagittal · 3.0mm · 0.43mm/px · 3 of 11 slices shown]
[im 3/11]
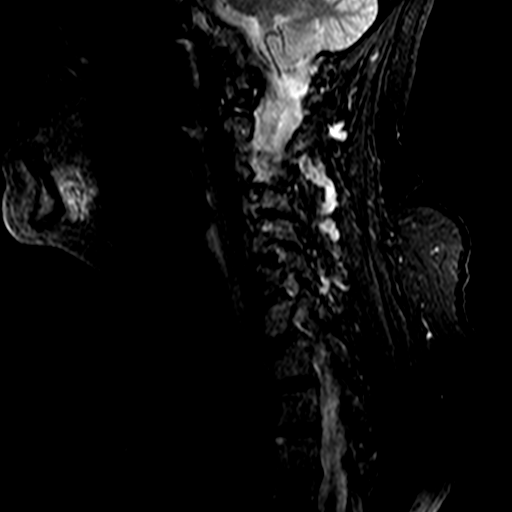
[im 7/11]
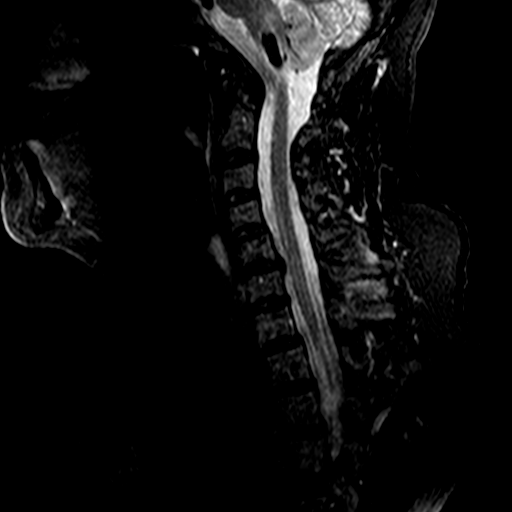
[im 11/11]
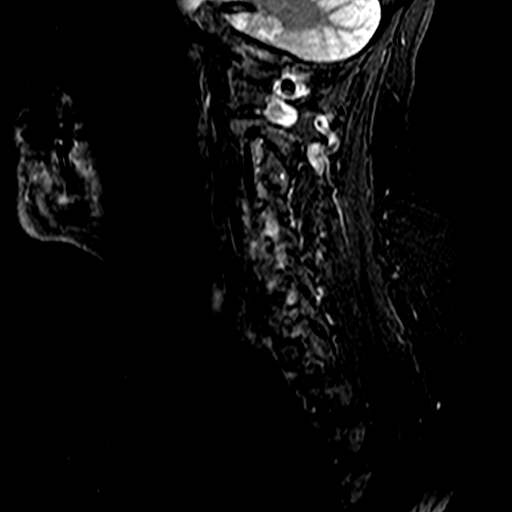

[Series 7: T2 · sagittal · 3.0mm · 0.43mm/px · 6 of 11 slices shown (1 of 2)]
[im 1/11]
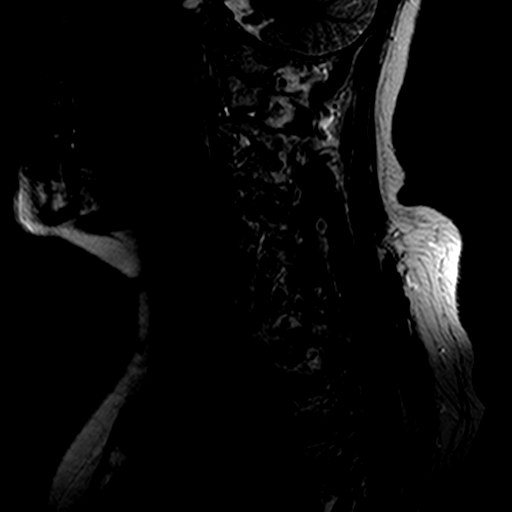
[im 3/11]
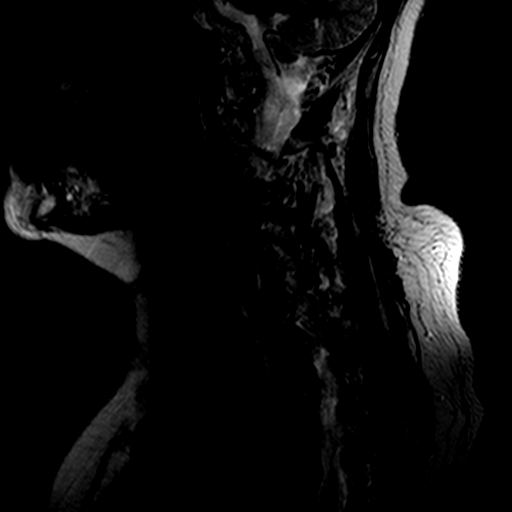
[im 5/11]
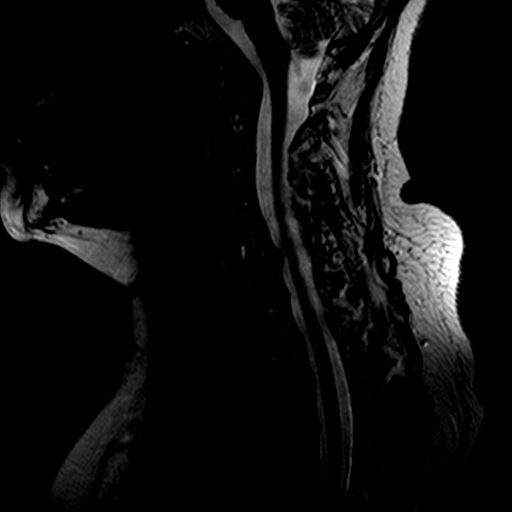
[im 7/11]
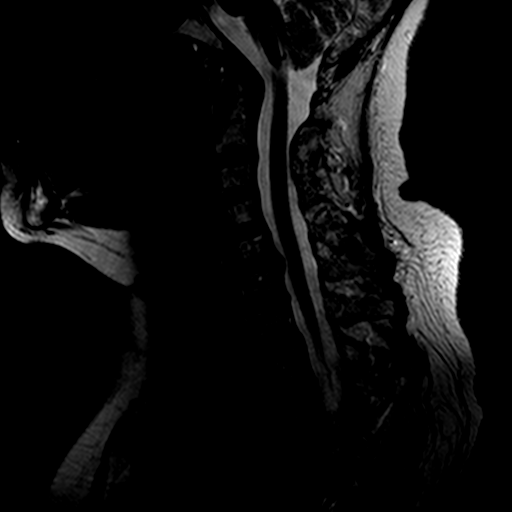
[im 9/11]
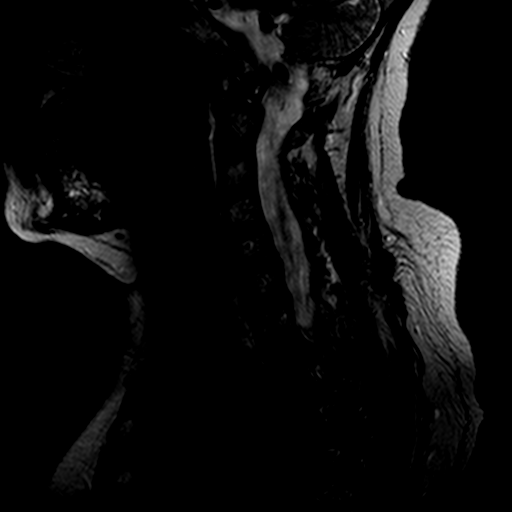
[im 11/11]
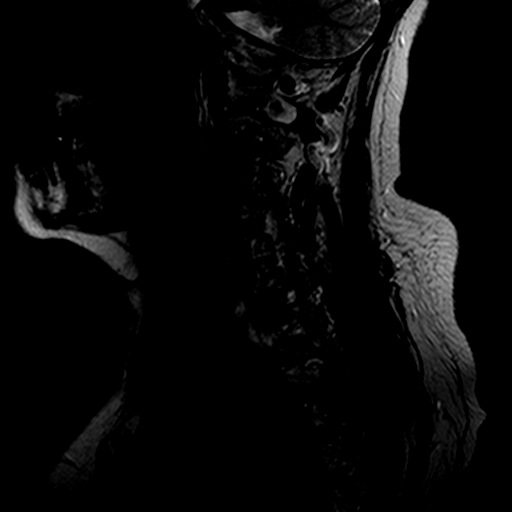

[Series 8: T2 · axial · 4.0mm · 0.69mm/px · z∈[-88,+2]mm · 9 of 21 slices shown (2 of 2)]
[im 1/21]
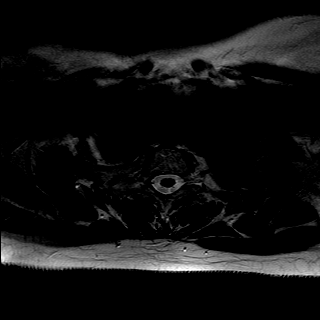
[im 4/21]
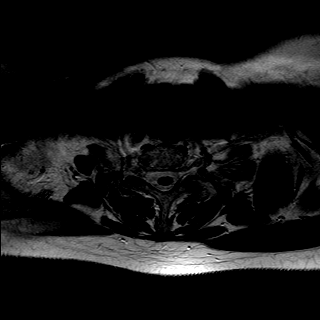
[im 6/21]
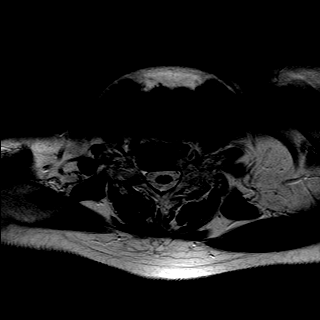
[im 10/21]
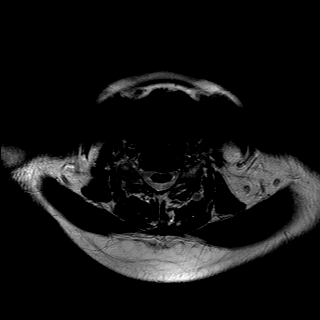
[im 11/21]
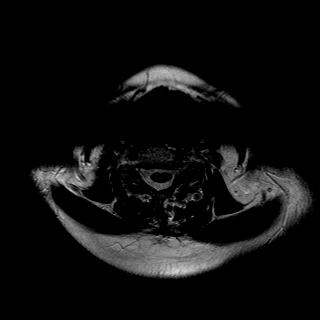
[im 15/21]
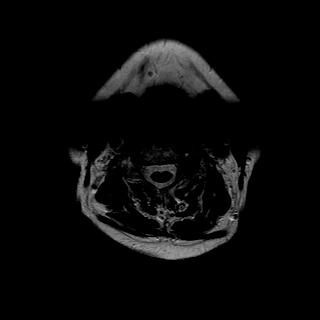
[im 17/21]
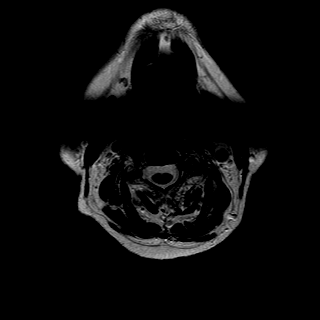
[im 19/21]
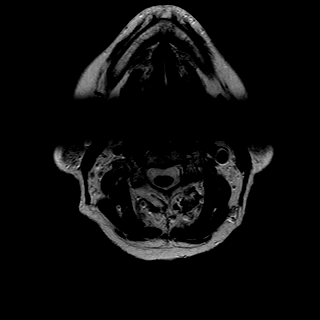
[im 21/21]
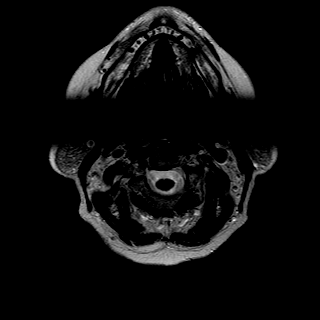

[24 of 48 positions shown; findings below may reference images not displayed]

IMPRESSION: 1.  There is mild cervical kyphosis at C5-6.  There is progression of disc degeneration and spondylosis at C5-6 with central and left sided spurring but without significant spinal stenosis.  There is some mild left foraminal encroachment at C5-6 due to spurring.  
2.  Small central disc protrusion at C2-3.

## 2008-02-11 ENCOUNTER — Ambulatory Visit: Payer: Self-pay | Admitting: Internal Medicine

## 2008-02-11 LAB — CONVERTED CEMR LAB
Bilirubin Urine: NEGATIVE
Blood in Urine, dipstick: NEGATIVE
Glucose, Urine, Semiquant: NEGATIVE
Ketones, urine, test strip: NEGATIVE
Nitrite: NEGATIVE
Protein, U semiquant: NEGATIVE
Specific Gravity, Urine: <1.005
Urobilinogen, UA: 0.2
WBC Urine, dipstick: NEGATIVE
pH: 7

## 2008-04-13 ENCOUNTER — Ambulatory Visit: Payer: Self-pay | Admitting: Internal Medicine

## 2008-04-16 ENCOUNTER — Encounter (INDEPENDENT_AMBULATORY_CARE_PROVIDER_SITE_OTHER): Payer: Self-pay | Admitting: *Deleted

## 2008-04-16 LAB — CONVERTED CEMR LAB
BUN: 7 mg/dL (ref 6–23)
CO2: 31 meq/L (ref 19–32)
Calcium: 9.4 mg/dL (ref 8.4–10.5)
Chloride: 100 meq/L (ref 96–112)
Creatinine, Ser: 0.7 mg/dL (ref 0.4–1.2)
GFR calc Af Amer: 105 mL/min
GFR calc non Af Amer: 86 mL/min
Glucose, Bld: 90 mg/dL (ref 70–99)
Potassium: 3.4 meq/L — ABNORMAL LOW (ref 3.5–5.1)
Sodium: 138 meq/L (ref 135–145)

## 2008-06-15 ENCOUNTER — Telehealth (INDEPENDENT_AMBULATORY_CARE_PROVIDER_SITE_OTHER): Payer: Self-pay | Admitting: *Deleted

## 2008-06-16 ENCOUNTER — Ambulatory Visit: Payer: Self-pay | Admitting: Internal Medicine

## 2008-06-16 LAB — CONVERTED CEMR LAB
Bilirubin Urine: NEGATIVE
Blood in Urine, dipstick: NEGATIVE
Glucose, Urine, Semiquant: NEGATIVE
Ketones, urine, test strip: NEGATIVE
Nitrite: NEGATIVE
Protein, U semiquant: NEGATIVE
Specific Gravity, Urine: <1.005
Urobilinogen, UA: 0.2
WBC Urine, dipstick: NEGATIVE
pH: 6

## 2008-06-17 ENCOUNTER — Telehealth (INDEPENDENT_AMBULATORY_CARE_PROVIDER_SITE_OTHER): Payer: Self-pay | Admitting: *Deleted

## 2008-06-17 LAB — CONVERTED CEMR LAB
BUN: 10 mg/dL (ref 6–23)
Basophils Absolute: 0 10*3/uL (ref 0.0–0.1)
Basophils Relative: 0.1 % (ref 0.0–3.0)
CO2: 32 meq/L (ref 19–32)
Calcium: 9.4 mg/dL (ref 8.4–10.5)
Chloride: 99 meq/L (ref 96–112)
Creatinine, Ser: 0.8 mg/dL (ref 0.4–1.2)
Eosinophils Absolute: 0.3 10*3/uL (ref 0.0–0.7)
Eosinophils Relative: 5 % (ref 0.0–5.0)
GFR calc Af Amer: 90 mL/min
GFR calc non Af Amer: 74 mL/min
Glucose, Bld: 82 mg/dL (ref 70–99)
HCT: 52.8 % — ABNORMAL HIGH (ref 36.0–46.0)
Hemoglobin: 17.3 g/dL — ABNORMAL HIGH (ref 12.0–15.0)
Lymphocytes Relative: 17.7 % (ref 12.0–46.0)
MCHC: 32.7 g/dL (ref 30.0–36.0)
MCV: 85.5 fL (ref 78.0–100.0)
Monocytes Absolute: 0.5 10*3/uL (ref 0.1–1.0)
Monocytes Relative: 7.7 % (ref 3.0–12.0)
Neutro Abs: 4.8 10*3/uL (ref 1.4–7.7)
Neutrophils Relative %: 69.5 % (ref 43.0–77.0)
Platelets: 498 10*3/uL — ABNORMAL HIGH (ref 150–400)
Potassium: 3.7 meq/L (ref 3.5–5.1)
RBC: 6.18 M/uL — ABNORMAL HIGH (ref 3.87–5.11)
RDW: 14 % (ref 11.5–14.6)
Sodium: 140 meq/L (ref 135–145)
WBC: 6.8 10*3/uL (ref 4.5–10.5)

## 2008-07-05 ENCOUNTER — Ambulatory Visit: Payer: Self-pay | Admitting: Gastroenterology

## 2008-07-05 DIAGNOSIS — R1032 Left lower quadrant pain: Secondary | ICD-10-CM | POA: Insufficient documentation

## 2008-07-08 ENCOUNTER — Telehealth: Payer: Self-pay | Admitting: Gastroenterology

## 2008-07-14 ENCOUNTER — Ambulatory Visit: Payer: Self-pay | Admitting: Gastroenterology

## 2008-07-14 LAB — HM COLONOSCOPY

## 2008-07-19 ENCOUNTER — Telehealth: Payer: Self-pay | Admitting: Internal Medicine

## 2008-07-29 ENCOUNTER — Encounter (INDEPENDENT_AMBULATORY_CARE_PROVIDER_SITE_OTHER): Payer: Self-pay | Admitting: *Deleted

## 2008-08-16 ENCOUNTER — Ambulatory Visit: Payer: Self-pay | Admitting: Internal Medicine

## 2008-08-16 DIAGNOSIS — M1990 Unspecified osteoarthritis, unspecified site: Secondary | ICD-10-CM | POA: Insufficient documentation

## 2008-09-07 ENCOUNTER — Encounter: Payer: Self-pay | Admitting: Internal Medicine

## 2008-09-07 ENCOUNTER — Ambulatory Visit: Payer: Self-pay | Admitting: Internal Medicine

## 2008-09-28 ENCOUNTER — Telehealth: Payer: Self-pay | Admitting: Internal Medicine

## 2008-09-29 ENCOUNTER — Ambulatory Visit: Payer: Self-pay | Admitting: Internal Medicine

## 2008-12-27 ENCOUNTER — Ambulatory Visit: Payer: Self-pay | Admitting: Internal Medicine

## 2008-12-27 ENCOUNTER — Inpatient Hospital Stay (HOSPITAL_COMMUNITY): Admission: AD | Admit: 2008-12-27 | Discharge: 2009-01-01 | Payer: Self-pay | Admitting: Internal Medicine

## 2008-12-27 DIAGNOSIS — I2589 Other forms of chronic ischemic heart disease: Secondary | ICD-10-CM | POA: Insufficient documentation

## 2008-12-27 DIAGNOSIS — R634 Abnormal weight loss: Secondary | ICD-10-CM | POA: Insufficient documentation

## 2008-12-30 ENCOUNTER — Encounter: Payer: Self-pay | Admitting: Internal Medicine

## 2009-01-05 ENCOUNTER — Ambulatory Visit: Payer: Self-pay | Admitting: Internal Medicine

## 2009-01-18 IMAGING — CT CT HEAD W/O CM
1 series · 16 of 28 positions shown, 20 images · IV contrast (agent unspecified)
Comparison: None.

CLINICAL DATA: Migraine headache with blurring vision in the left eye.
 HEAD CT WITHOUT CONTRAST:
TECHNIQUE: Contiguous axial images were obtained from the base of the skull through the vertex according to standard protocol without contrast.

[Series 2: brain · axial · 0.47mm/px · z∈[+122,+251]mm · 16 of 28 slices shown, 20 images]
[im 2/28  brain]
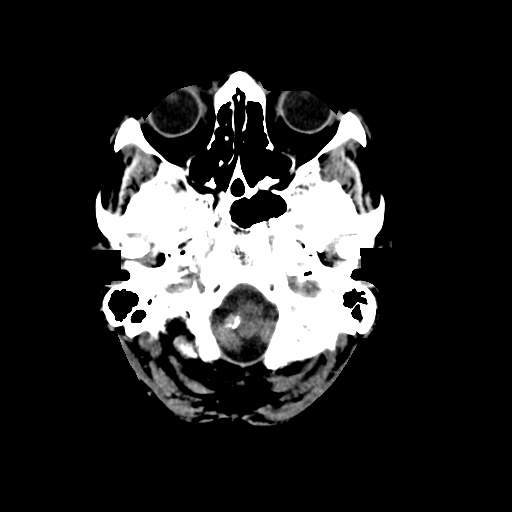
[im 2/28  bone]
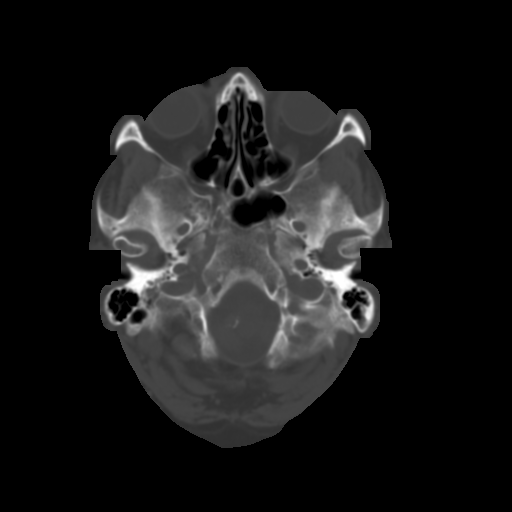
[im 4/28  brain]
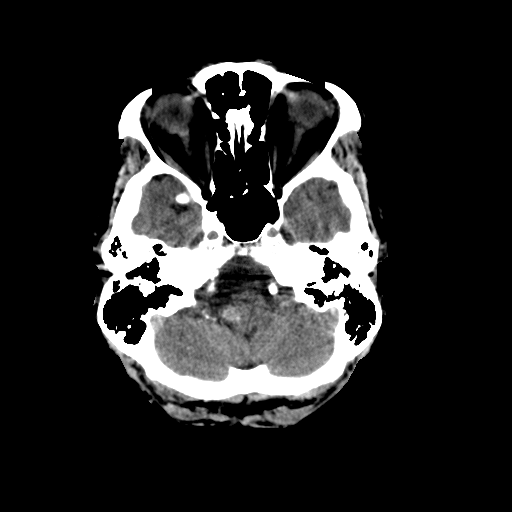
[im 6/28  brain]
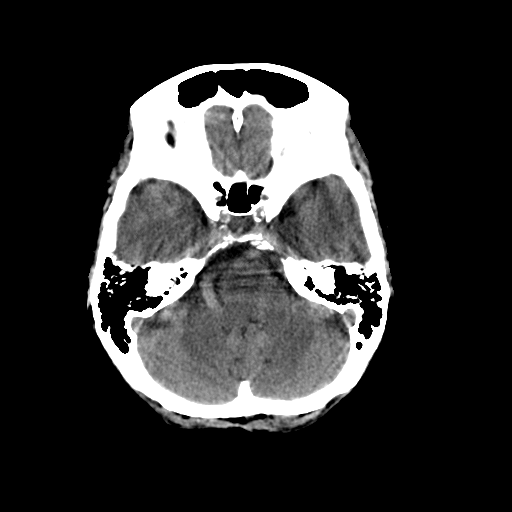
[im 7/28  brain]
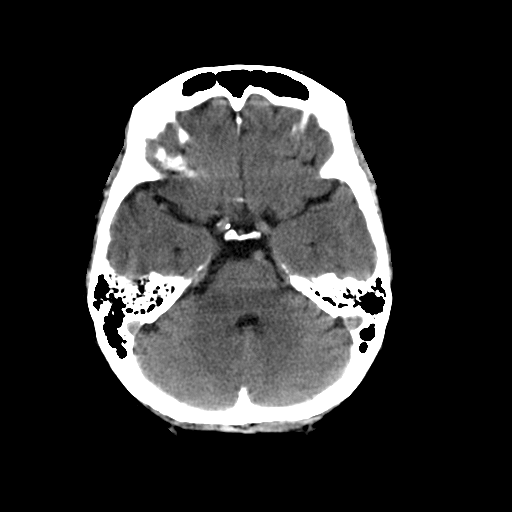
[im 9/28  brain]
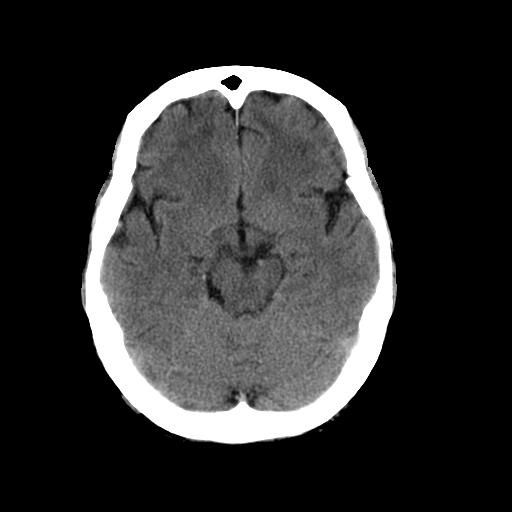
[im 9/28  bone]
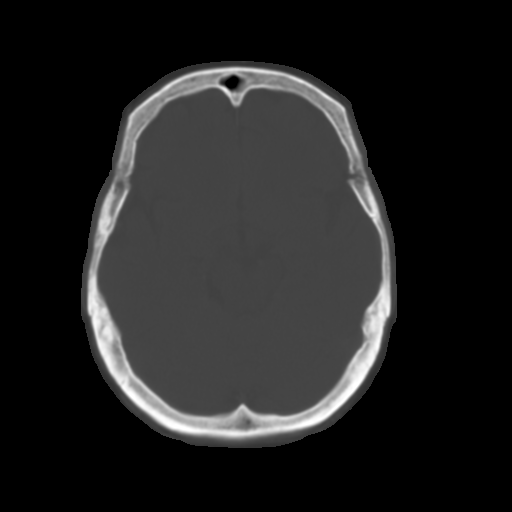
[im 10/28  brain]
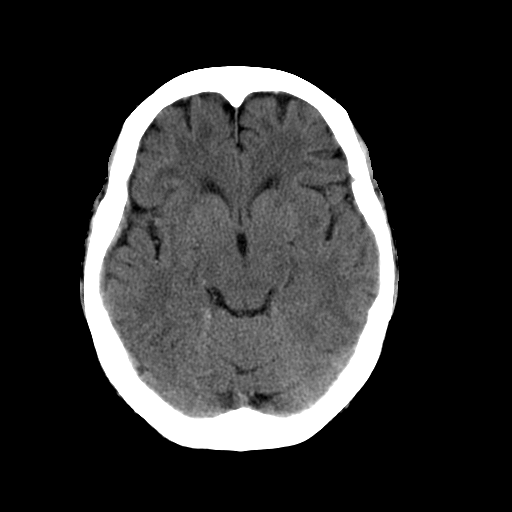
[im 12/28  brain]
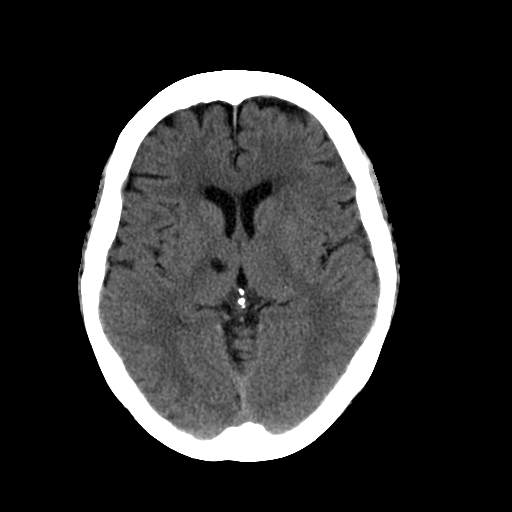
[im 14/28  brain]
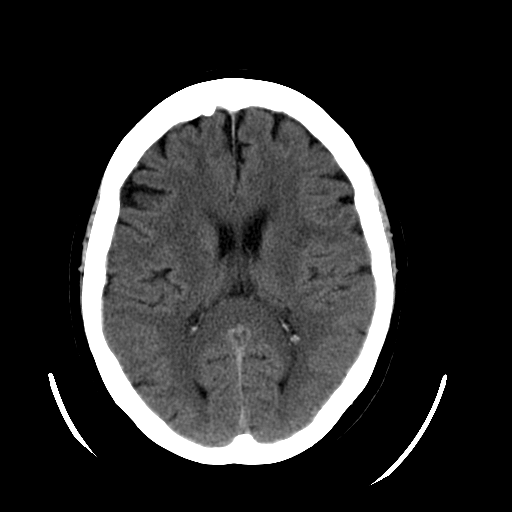
[im 15/28  brain]
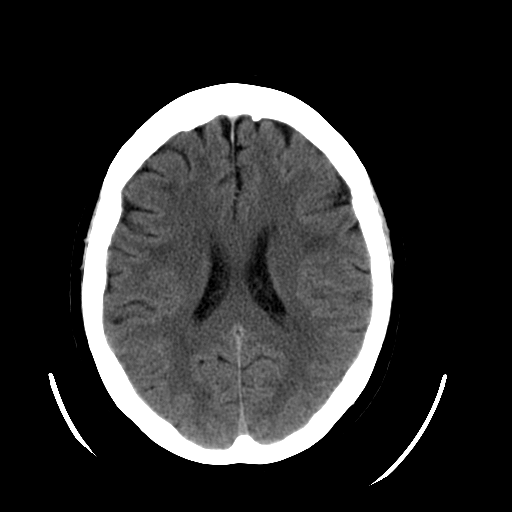
[im 15/28  bone]
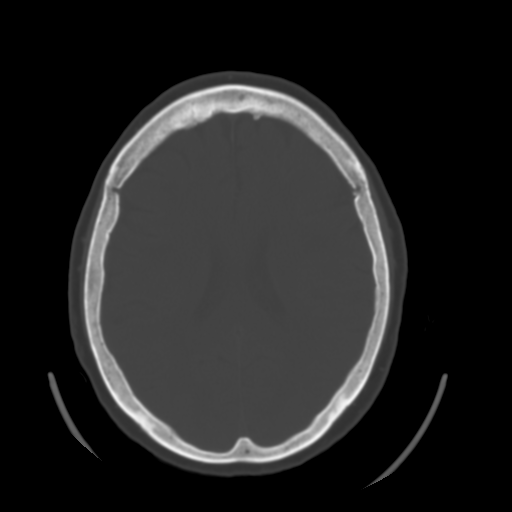
[im 17/28  brain]
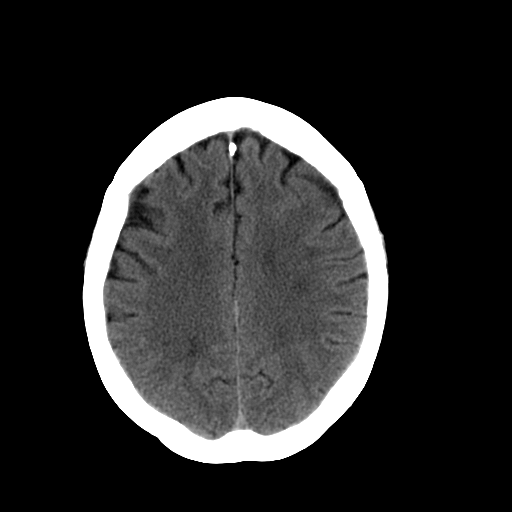
[im 19/28  brain]
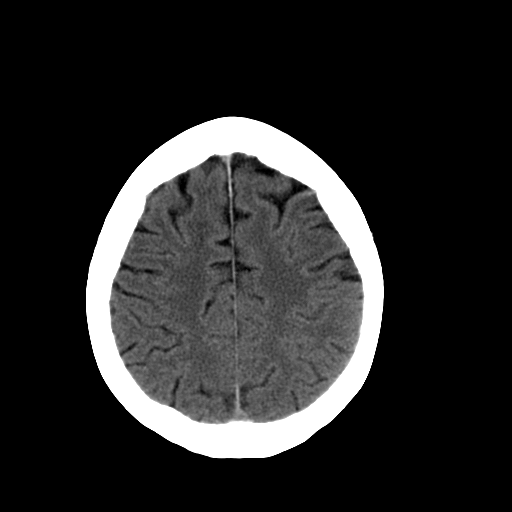
[im 20/28  brain]
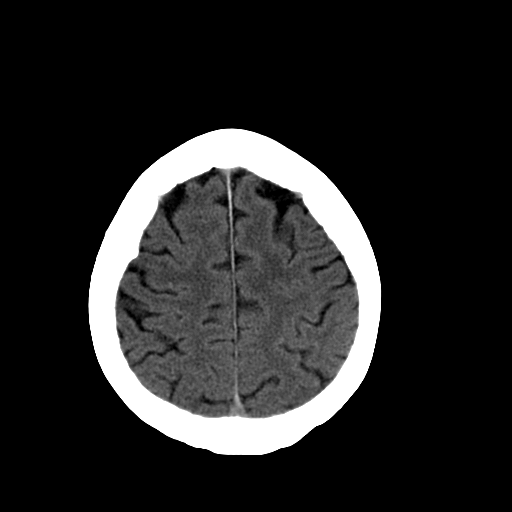
[im 22/28  brain]
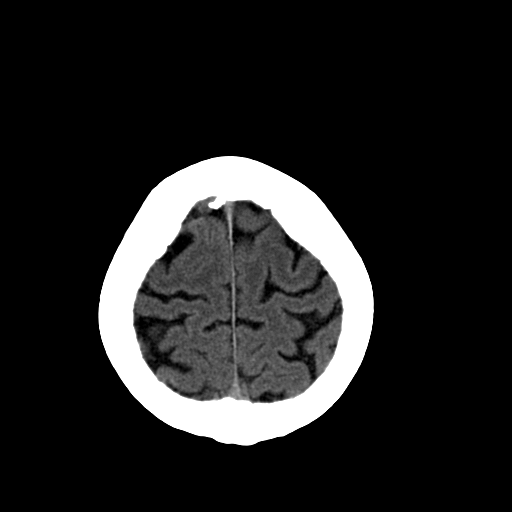
[im 22/28  bone]
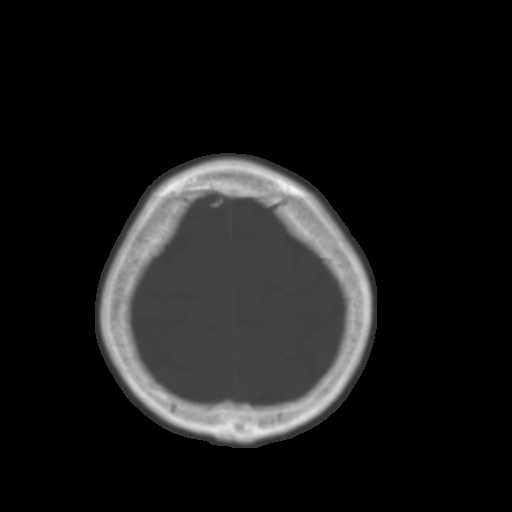
[im 23/28  brain]
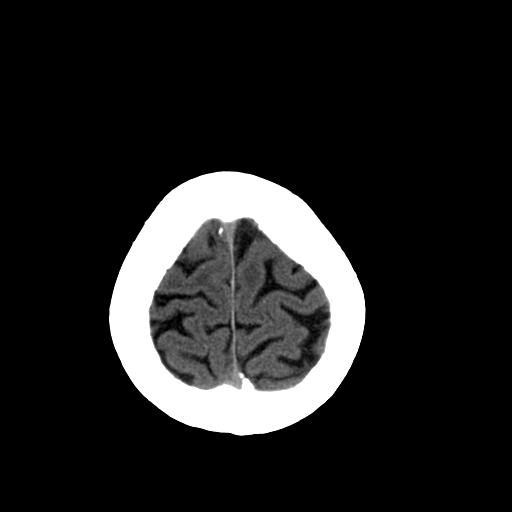
[im 25/28  brain]
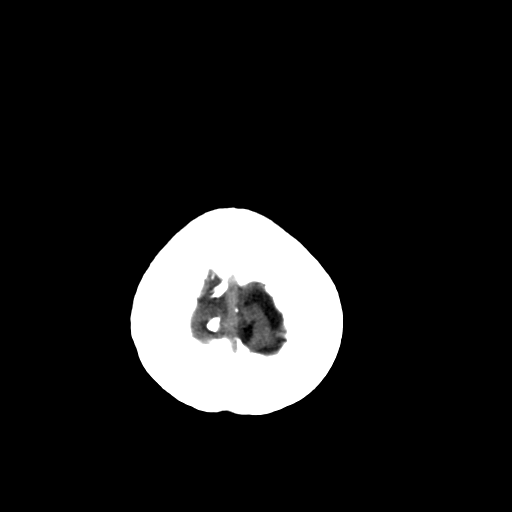
[im 27/28  brain]
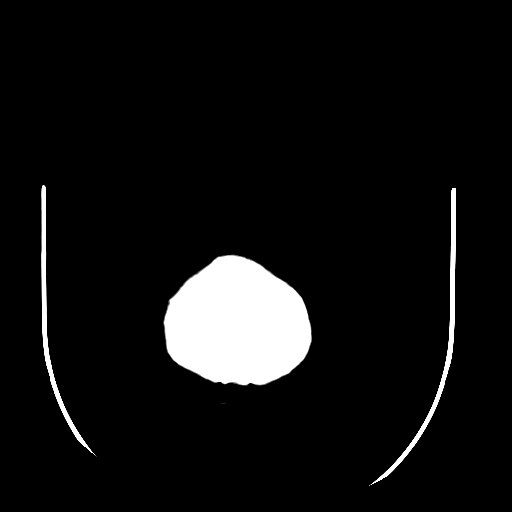

[16 of 28 positions shown; findings below may reference images not displayed]

FINDINGS: Ventricular size and CSF spaces within normal limits. There is an old lacunar type infarct in the right thalamus. There may be a tiny lacune in the left basal ganglia. There is mild microvascular white matter disease. 
 No obvious acute infarct or bleed. Calvarium intact. No fluid in the sinuses visualized. 
 The right vertebral artery is quite tortuous and ectatic.
IMPRESSION: 1.  Mild microvascular white matter changes. 
 2.  Old right thalamic infarct. 
 3.  Possible old left basal ganglia infarct.
 4.  No definite acute abnormality.

## 2009-01-19 ENCOUNTER — Ambulatory Visit: Payer: Self-pay | Admitting: Internal Medicine

## 2009-03-02 ENCOUNTER — Emergency Department (HOSPITAL_COMMUNITY): Admission: EM | Admit: 2009-03-02 | Discharge: 2009-03-02 | Payer: Self-pay | Admitting: Emergency Medicine

## 2009-03-08 ENCOUNTER — Ambulatory Visit: Payer: Self-pay | Admitting: Internal Medicine

## 2009-03-09 ENCOUNTER — Encounter: Payer: Self-pay | Admitting: Internal Medicine

## 2009-03-23 ENCOUNTER — Telehealth: Payer: Self-pay | Admitting: Family Medicine

## 2009-04-09 ENCOUNTER — Encounter: Payer: Self-pay | Admitting: Internal Medicine

## 2009-05-10 ENCOUNTER — Ambulatory Visit: Payer: Self-pay | Admitting: Internal Medicine

## 2009-05-10 LAB — CONVERTED CEMR LAB
Bacteria, UA: NONE SEEN
Casts: NONE SEEN /lpf
Crystals: NONE SEEN
RBC / HPF: NONE SEEN (ref <3)
Squamous Epithelial / HPF: NONE SEEN /lpf
WBC, UA: NONE SEEN cells/hpf (ref <3)

## 2009-07-11 ENCOUNTER — Ambulatory Visit: Payer: Self-pay | Admitting: Internal Medicine

## 2009-07-11 DIAGNOSIS — I868 Varicose veins of other specified sites: Secondary | ICD-10-CM | POA: Insufficient documentation

## 2009-07-11 LAB — CONVERTED CEMR LAB: Vit D, 25-Hydroxy: 24 ng/mL — ABNORMAL LOW (ref 30–89)

## 2009-07-13 LAB — CONVERTED CEMR LAB
BUN: 9 mg/dL (ref 6–23)
CO2: 31 meq/L (ref 19–32)
Calcium: 9.6 mg/dL (ref 8.4–10.5)
Chloride: 105 meq/L (ref 96–112)
Creatinine, Ser: 0.8 mg/dL (ref 0.4–1.2)
GFR calc non Af Amer: 73.74 mL/min (ref >60)
Glucose, Bld: 93 mg/dL (ref 70–99)
Potassium: 5.3 meq/L — ABNORMAL HIGH (ref 3.5–5.1)
Sodium: 139 meq/L (ref 135–145)
TSH: 2.25 microintl units/mL (ref 0.35–5.50)

## 2009-08-01 ENCOUNTER — Ambulatory Visit: Payer: Self-pay | Admitting: Internal Medicine

## 2009-08-03 ENCOUNTER — Ambulatory Visit: Payer: Self-pay | Admitting: Cardiology

## 2009-08-03 DIAGNOSIS — I428 Other cardiomyopathies: Secondary | ICD-10-CM | POA: Insufficient documentation

## 2009-08-04 LAB — CONVERTED CEMR LAB
BUN: 5 mg/dL — ABNORMAL LOW (ref 6–23)
CO2: 31 meq/L (ref 19–32)
Calcium: 9.2 mg/dL (ref 8.4–10.5)
Chloride: 104 meq/L (ref 96–112)
Creatinine, Ser: 0.8 mg/dL (ref 0.4–1.2)
GFR calc non Af Amer: 73.73 mL/min (ref >60)
Glucose, Bld: 96 mg/dL (ref 70–99)
Potassium: 3.9 meq/L (ref 3.5–5.1)
Sodium: 141 meq/L (ref 135–145)

## 2009-10-06 ENCOUNTER — Ambulatory Visit: Payer: Self-pay | Admitting: Internal Medicine

## 2009-10-06 LAB — CONVERTED CEMR LAB
Bilirubin Urine: NEGATIVE
Blood in Urine, dipstick: NEGATIVE
Glucose, Urine, Semiquant: NEGATIVE
Ketones, urine, test strip: NEGATIVE
Nitrite: NEGATIVE
Specific Gravity, Urine: 1.01
Urobilinogen, UA: 0.2
WBC Urine, dipstick: NEGATIVE
pH: 6

## 2009-10-11 ENCOUNTER — Telehealth: Payer: Self-pay | Admitting: Internal Medicine

## 2009-10-19 ENCOUNTER — Ambulatory Visit: Payer: Self-pay | Admitting: Internal Medicine

## 2009-10-20 ENCOUNTER — Encounter: Payer: Self-pay | Admitting: Internal Medicine

## 2009-10-20 LAB — CONVERTED CEMR LAB: Vit D, 25-Hydroxy: 35 ng/mL (ref 30–89)

## 2009-10-21 LAB — CONVERTED CEMR LAB
Basophils Absolute: 0.1 10*3/uL (ref 0.0–0.1)
Basophils Relative: 0.6 % (ref 0.0–3.0)
Cholesterol: 234 mg/dL — ABNORMAL HIGH (ref 0–200)
Direct LDL: 168.4 mg/dL
Eosinophils Absolute: 0.4 10*3/uL (ref 0.0–0.7)
Eosinophils Relative: 3.5 % (ref 0.0–5.0)
HCT: 53 % — ABNORMAL HIGH (ref 36.0–46.0)
HDL: 58.1 mg/dL (ref >39.00)
Hemoglobin: 17.4 g/dL — ABNORMAL HIGH (ref 12.0–15.0)
Lymphocytes Relative: 13 % (ref 12.0–46.0)
Lymphs Abs: 1.3 10*3/uL (ref 0.7–4.0)
MCHC: 32.8 g/dL (ref 30.0–36.0)
MCV: 83.1 fL (ref 78.0–100.0)
Monocytes Absolute: 0.9 10*3/uL (ref 0.1–1.0)
Monocytes Relative: 9.1 % (ref 3.0–12.0)
Neutro Abs: 7.3 10*3/uL (ref 1.4–7.7)
Neutrophils Relative %: 73.8 % (ref 43.0–77.0)
Platelets: 557 10*3/uL — ABNORMAL HIGH (ref 150.0–400.0)
RBC: 6.38 M/uL — ABNORMAL HIGH (ref 3.87–5.11)
RDW: 16.9 % — ABNORMAL HIGH (ref 11.5–14.6)
Total CHOL/HDL Ratio: 4
Triglycerides: 74 mg/dL (ref 0.0–149.0)
VLDL: 14.8 mg/dL (ref 0.0–40.0)
WBC: 9.9 10*3/uL (ref 4.5–10.5)

## 2010-02-27 ENCOUNTER — Encounter: Payer: Self-pay | Admitting: Internal Medicine

## 2010-03-13 IMAGING — MR MR LUMBAR SPINE W/O CM
4 of 6 series · 16 of 48 positions shown · non-contrast
Comparison: None.

CLINICAL DATA: Severe back pain.  Loss of bladder control.

MRI LUMBAR SPINE WITHOUT CONTRAST
TECHNIQUE: Multiplanar and multiecho pulse sequences of the lumbar
spine were obtained without intravenous contrast.

[Series 2: T2 · sagittal · 4.0mm · 0.44mm/px · 4 of 11 slices shown (1 of 2)]
[im 1/11]
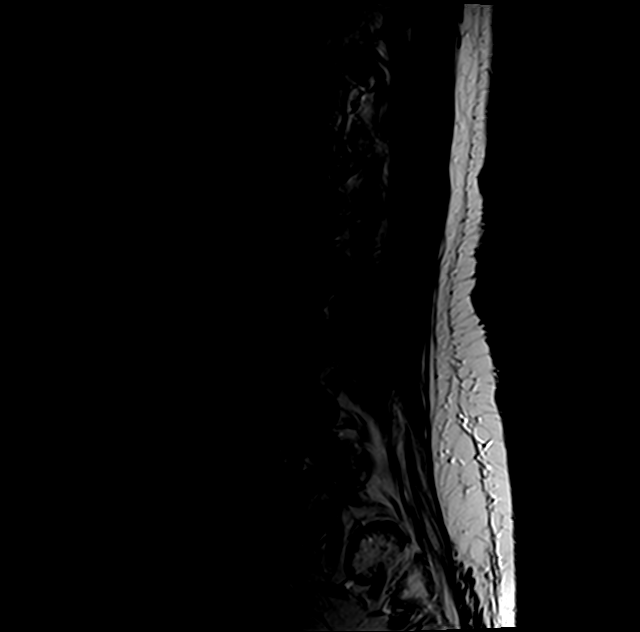
[im 4/11]
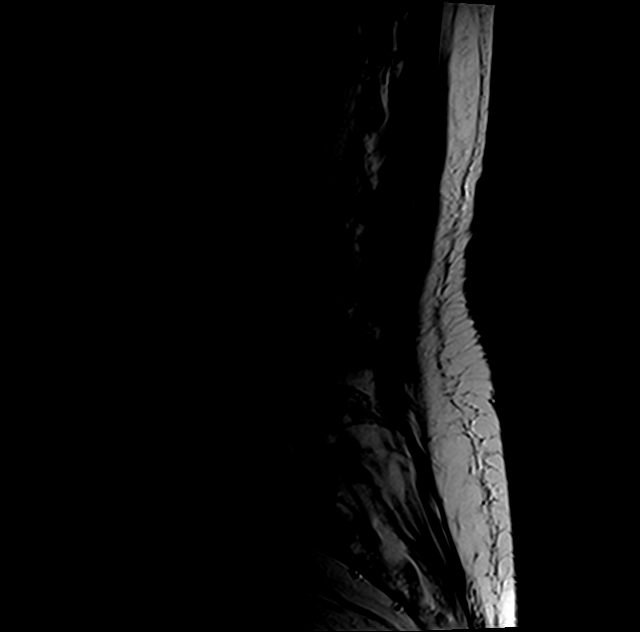
[im 7/11]
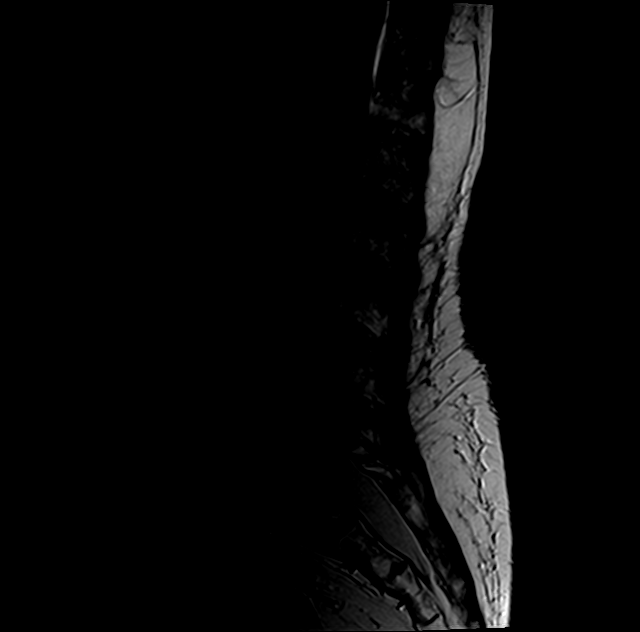
[im 11/11]
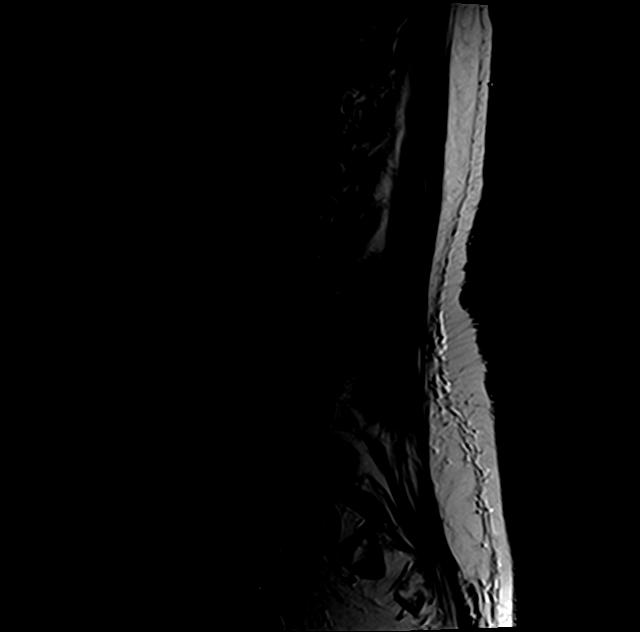

[Series 3: T1 · sagittal · 4.0mm · 0.44mm/px · 3 of 11 slices shown (1 of 2)]
[im 1/11]
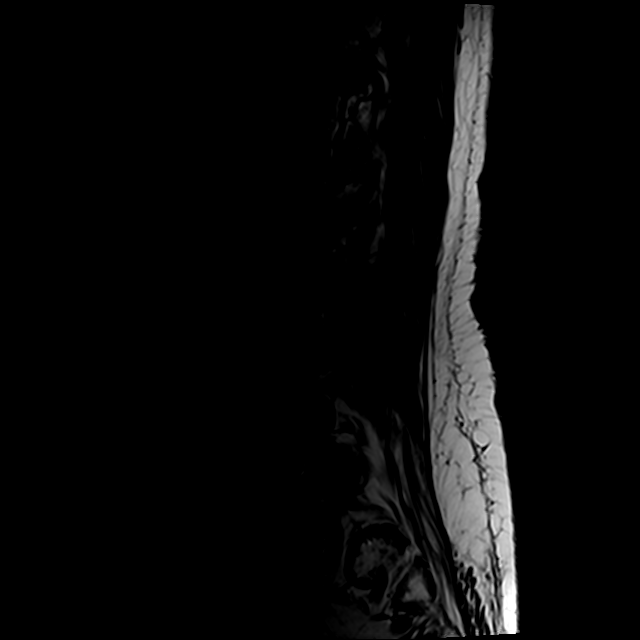
[im 7/11]
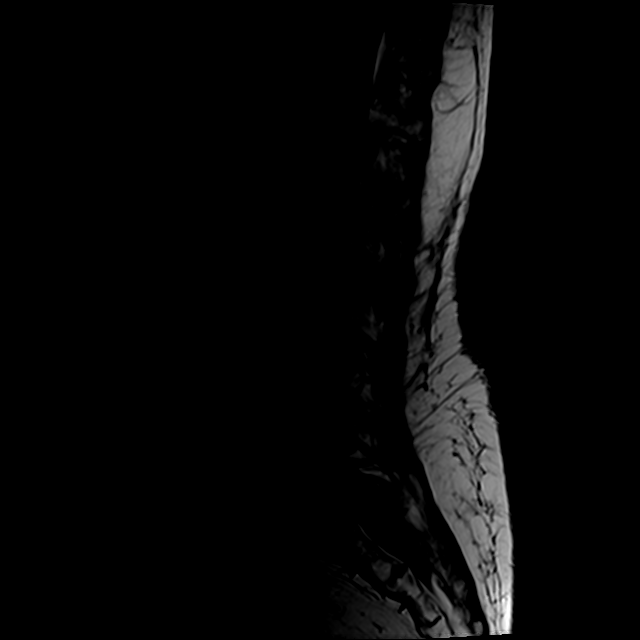
[im 11/11]
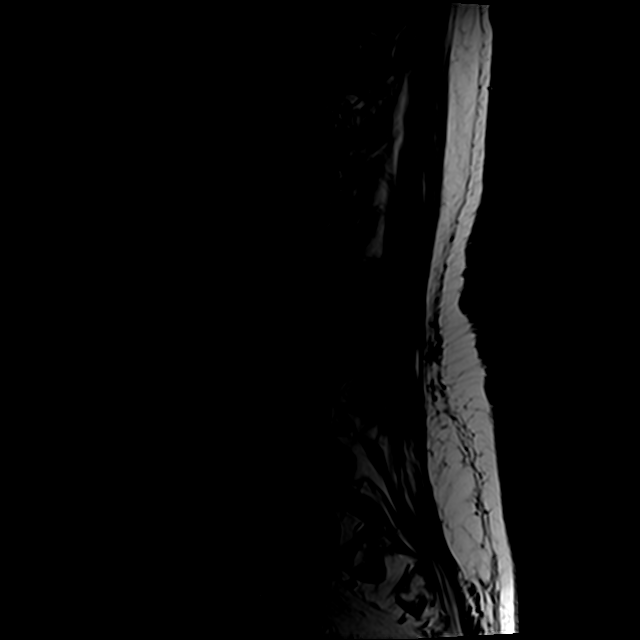

[Series 6: T2 · axial · 4.0mm · 0.49mm/px · z∈[-112,+92]mm · 6 of 35 slices shown (2 of 2)]
[im 1/35]
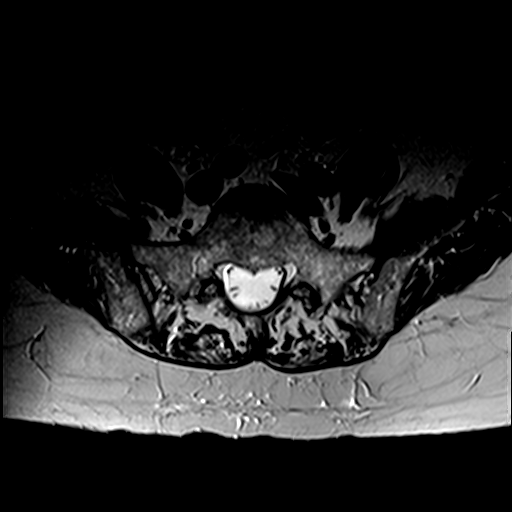
[im 5/35]
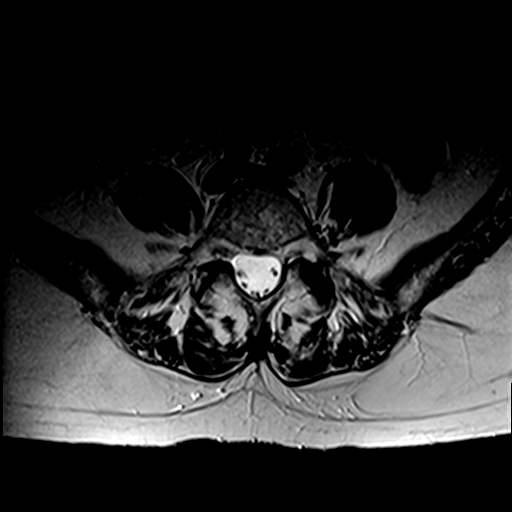
[im 10/35]
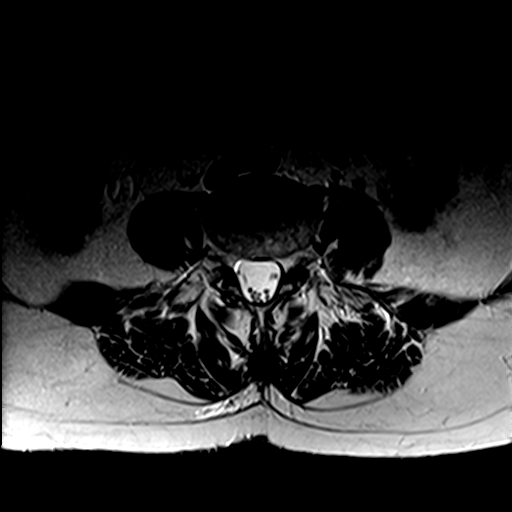
[im 15/35]
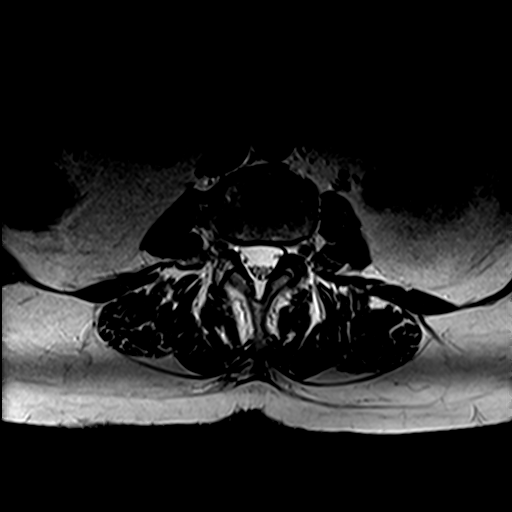
[im 18/35]
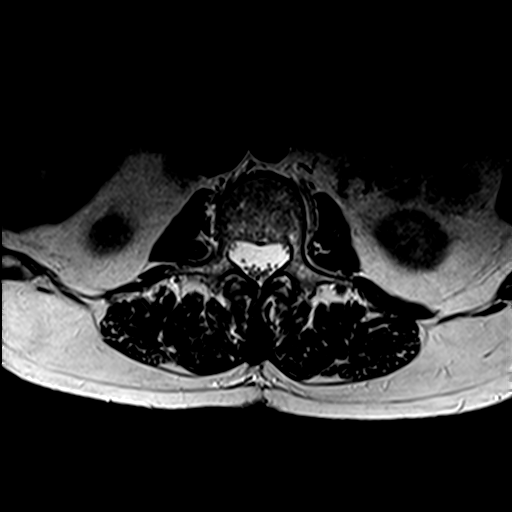
[im 30/35]
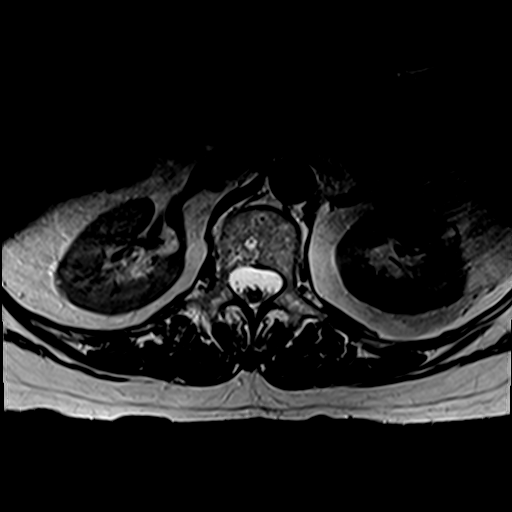

[Series 7: T1 · axial · 4.0mm · 0.49mm/px · z∈[-92,+92]mm · 3 of 35 slices shown (2 of 2)]
[im 5/35]
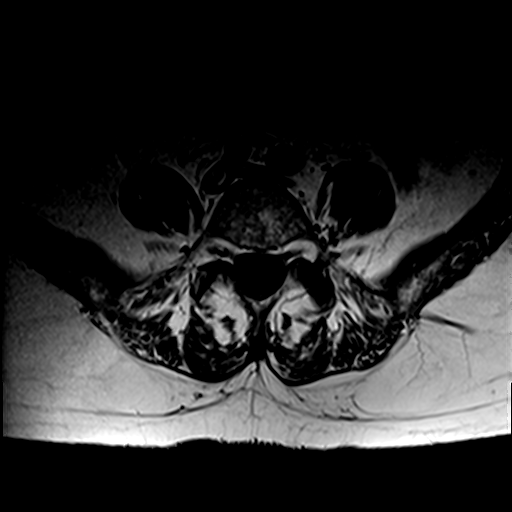
[im 18/35]
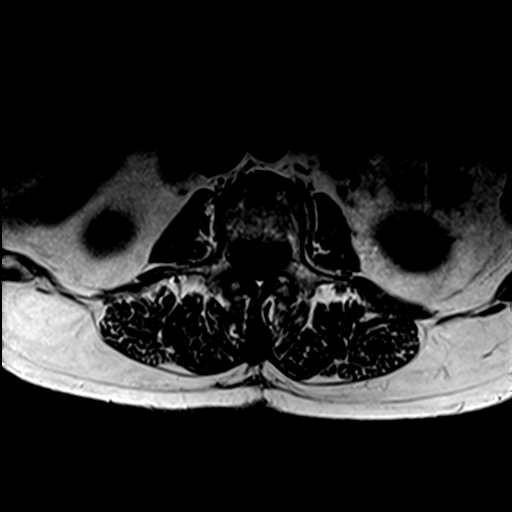
[im 30/35]
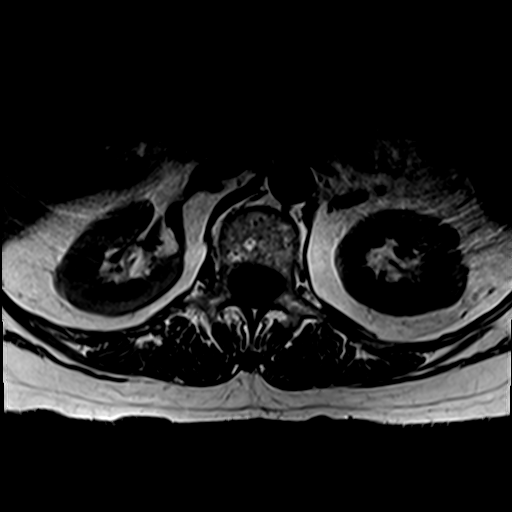

[16 of 48 positions shown; findings below may reference images not displayed]

FINDINGS: There is a central concavity in the inferior endplate of
L2 compatible with a compression fracture deformity with associated
mild edema consistent with acute/subacute injury.  There is mild
retropulsion off the inferior endplate of L2 as described below.
Vertebral body height is otherwise normal.  Vertebral body
alignment shows minimal anterolisthesis of L5 on S1 due to facet
degenerative disease.  Except as noted, marrow signal is normal.
Conus medullaris is normal signal position.  Visualized intra-
abdominal contents appear normal.  Mild convex left scoliosis
noted.

T12-L1:  Negative.

L1-2:  Tiny left paracentral annular tear with a shallow bulge
present. Central canal and foramina are patent.

L2-3:  Mild bony retropulsion off the inferior endplate of L2 the
disc bulge.  Central canal is only mildly narrowed.  Foramina are
open.

L3-4:  Mild disc bulge and facet arthropathy.  Central canal
foramina are open.

L4-5:  Negative for central canal or foraminal narrowing with mild
facet degenerative change noted.

L5-S1:  Facet arthropathy but the central canal foramina are open
IMPRESSION: 1.  Acute/subacute inferior endplate compression fracture of L2
with approximate 20-30% vertebral body height loss centrally and
minimal bony retropulsion off the inferior endplate.
2.  Negative for notable central canal or foraminal stenosis.
Advanced facet degenerative disease at L5-S1, worse on the left,
causes mild anterolisthesis.

## 2010-03-21 ENCOUNTER — Ambulatory Visit (HOSPITAL_COMMUNITY): Admission: RE | Admit: 2010-03-21 | Discharge: 2010-03-21 | Payer: Self-pay | Admitting: Internal Medicine

## 2010-03-21 LAB — HM MAMMOGRAPHY: HM Mammogram: NEGATIVE

## 2010-03-22 ENCOUNTER — Telehealth: Payer: Self-pay | Admitting: Internal Medicine

## 2010-03-22 IMAGING — CR DG CHEST 1V PORT
1 series · 1 of 1 positions shown · non-contrast
Comparison: 08/01/2003

CLINICAL DATA: Difficulty breathing

PORTABLE CHEST - 1 VIEW

[AP]
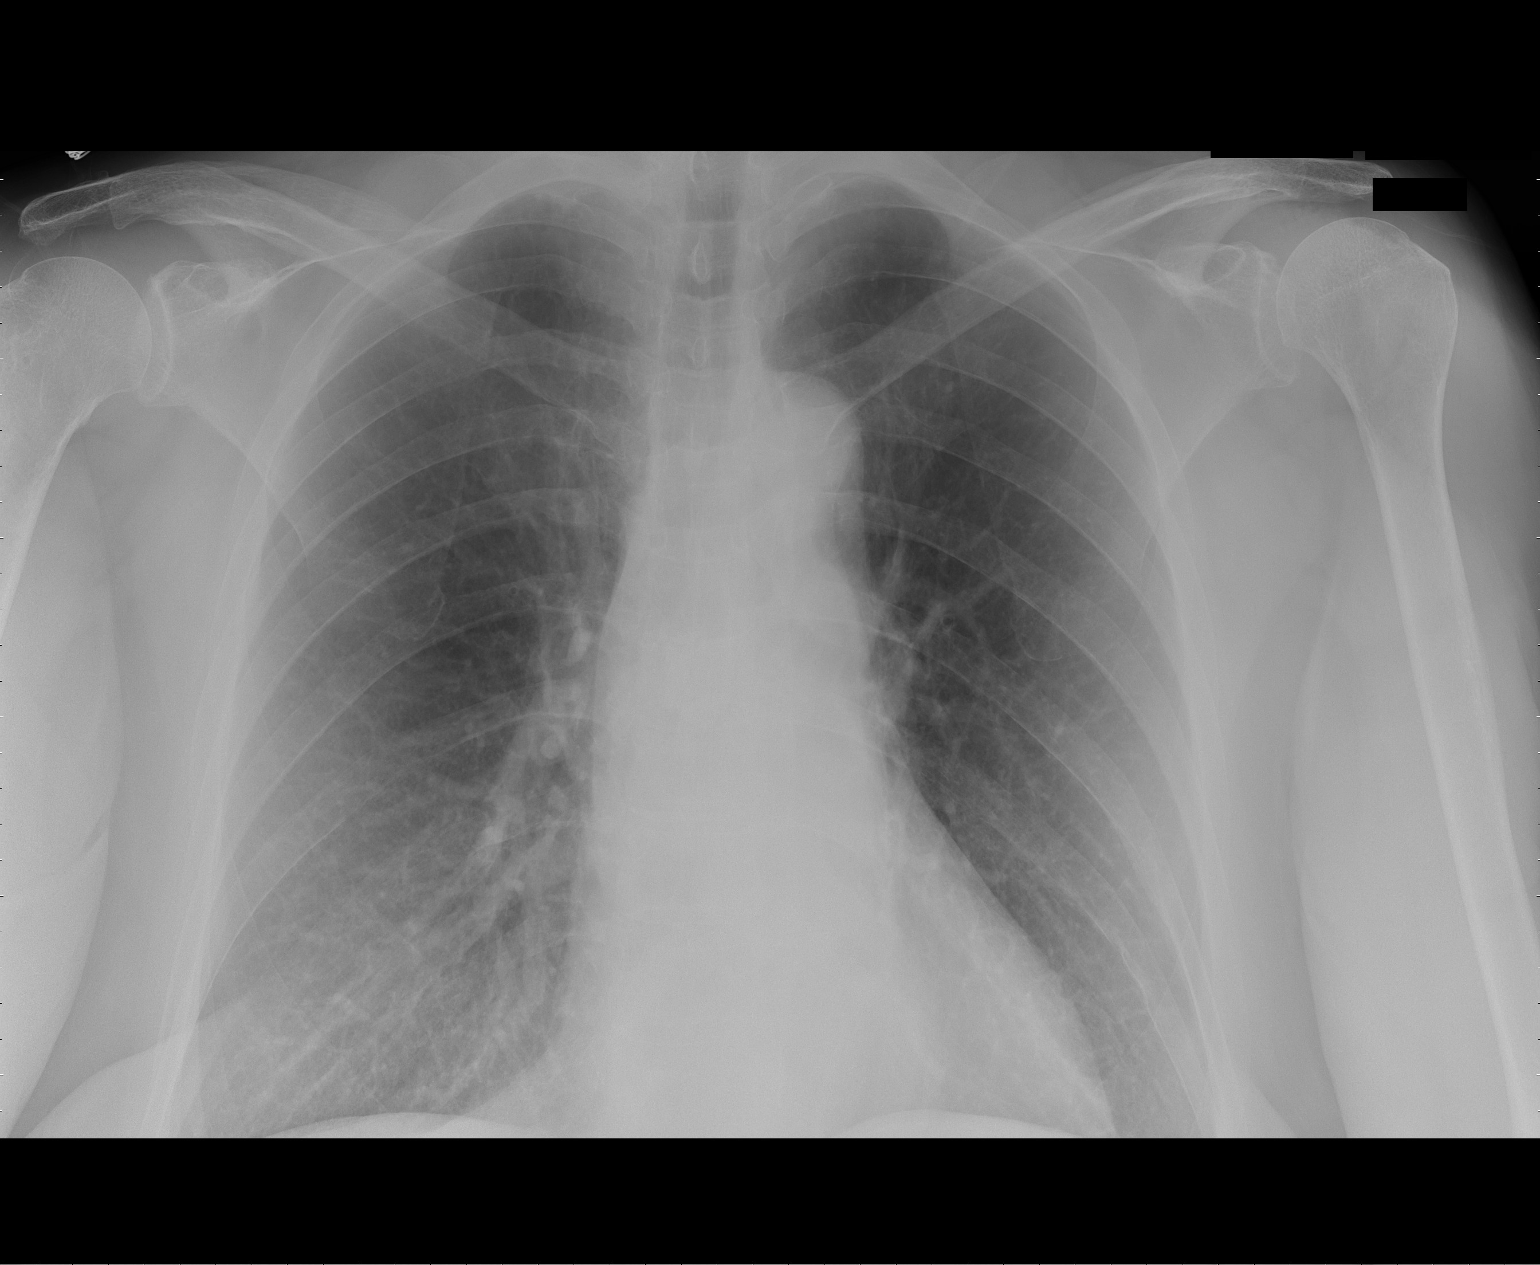

[1 of 1 positions shown; findings below may reference images not displayed]

FINDINGS: Lung fields are clear.  Cardiac size normal.  No hilar or
mediastinal abnormality.  Bones unremarkable.  Improved compared
with priors.
IMPRESSION: No active disease

## 2010-03-23 ENCOUNTER — Encounter (INDEPENDENT_AMBULATORY_CARE_PROVIDER_SITE_OTHER): Payer: Self-pay | Admitting: *Deleted

## 2010-03-23 IMAGING — CR DG CHEST 2V
2 series · 2 of 2 positions shown · non-contrast
Comparison: 09/06/2007, 08/01/2003

CLINICAL DATA: Back pain, difficulty breathing.  Short of breath.

CHEST - 2 VIEW

[w chest pa]
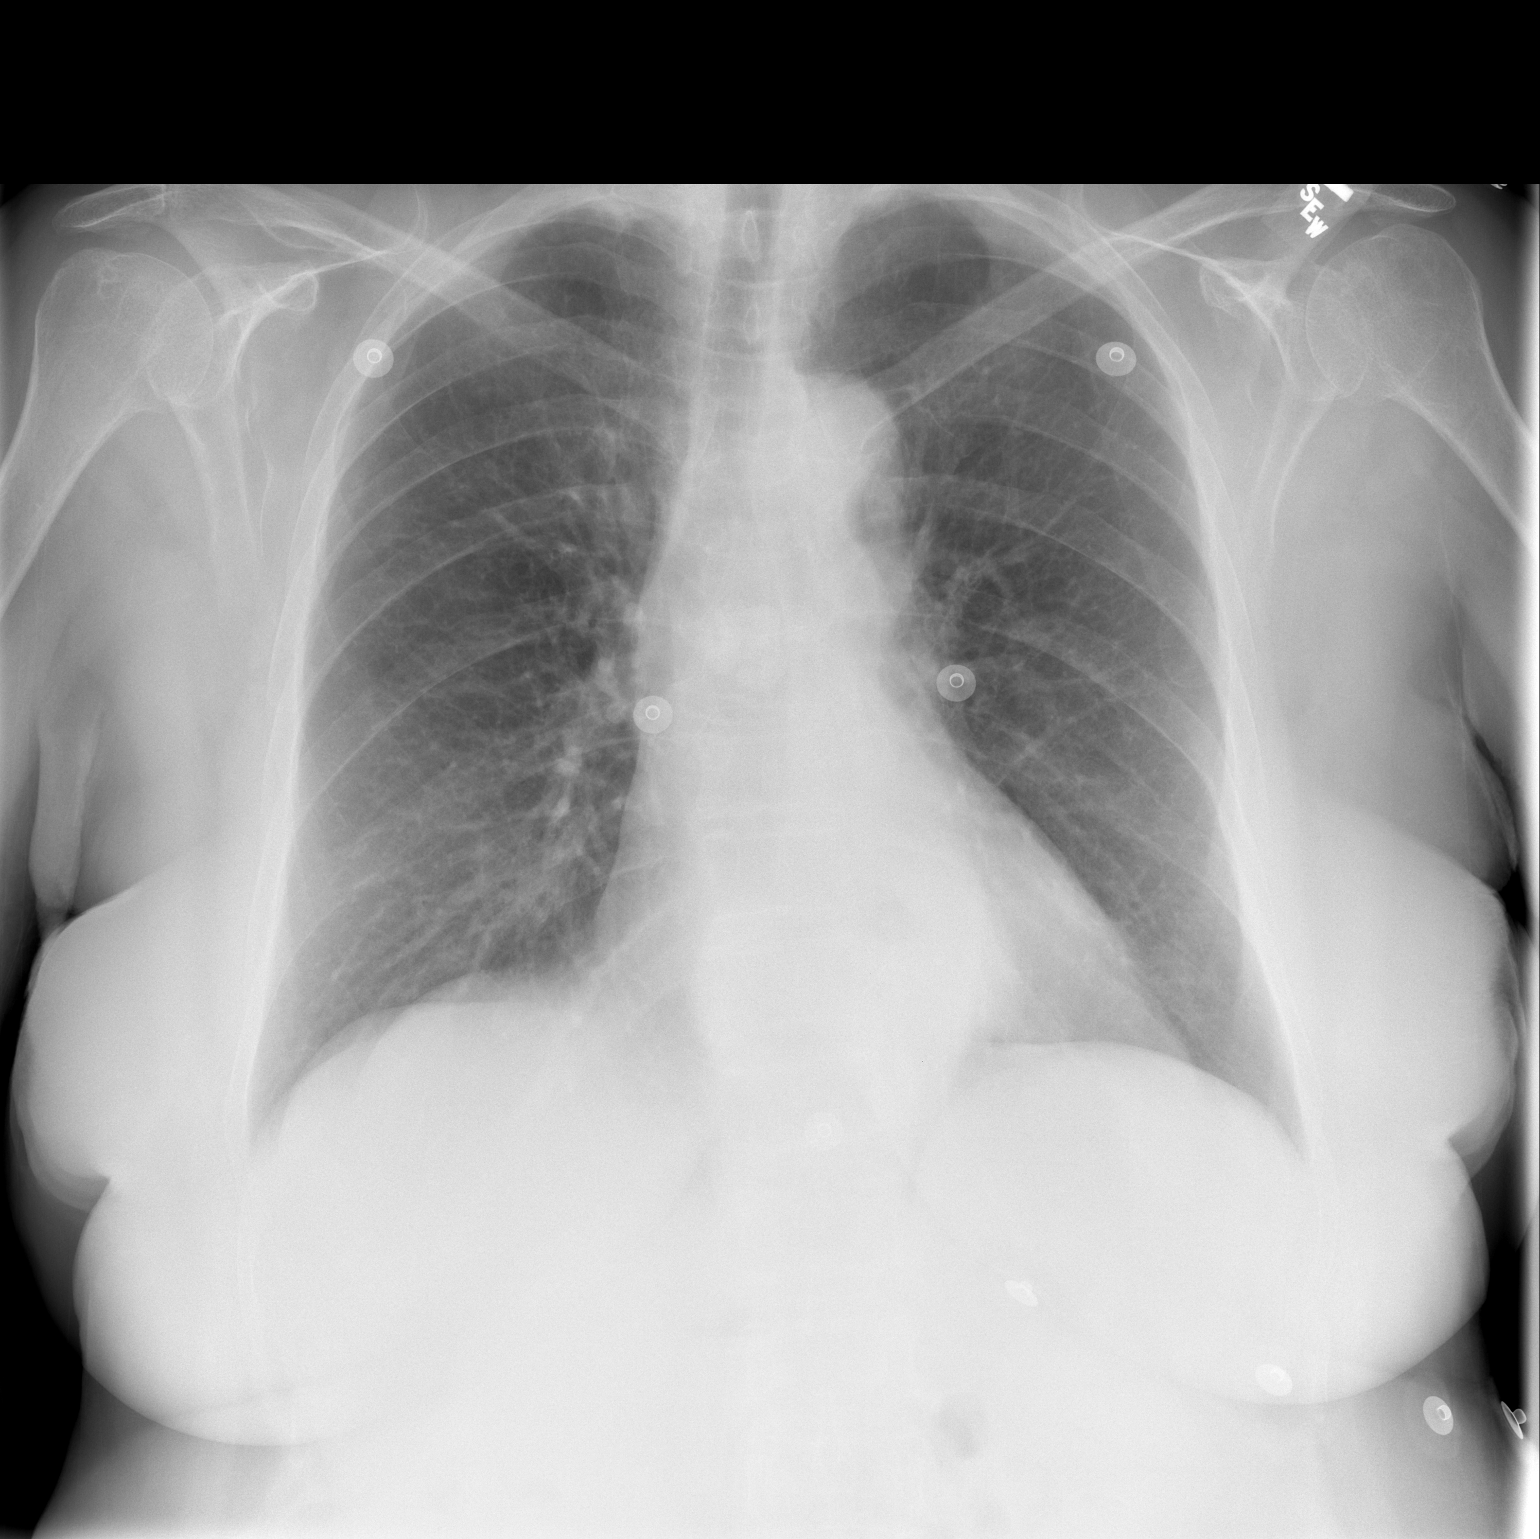

[w chest lat]
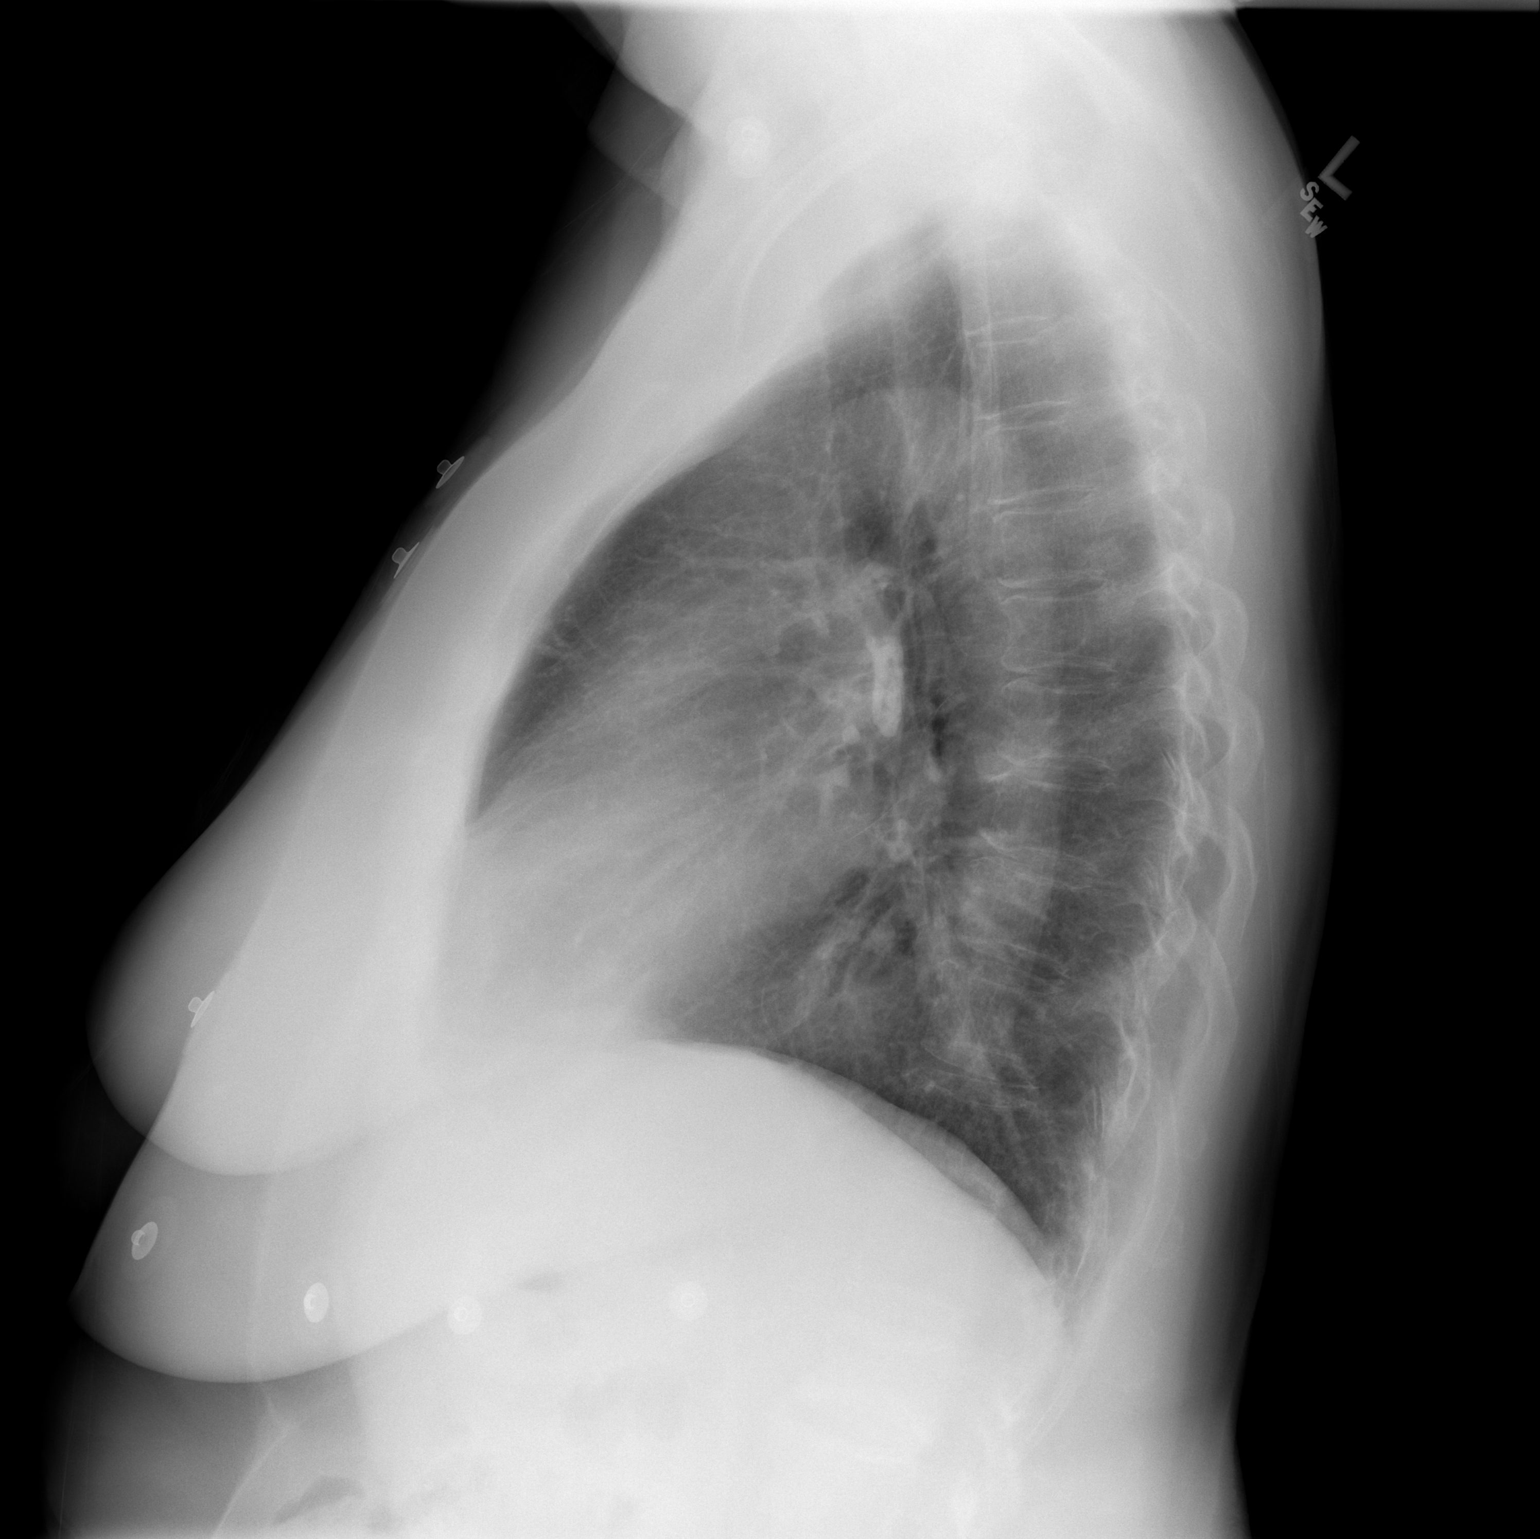

[2 of 2 positions shown; findings below may reference images not displayed]

FINDINGS: Cardiopericardial silhouette is unchanged.  Probable
hiatal hernia.  COPD.  Bilateral pleural apical scarring.  No
airspace disease or effusion. Calcified mediastinal lymph nodes
again noted.
IMPRESSION: 1.  Emphysema.
2.  No acute cardiopulmonary disease.
3.  Hiatal hernia.

## 2010-03-24 ENCOUNTER — Ambulatory Visit: Payer: Self-pay | Admitting: Internal Medicine

## 2010-03-26 IMAGING — XA IR DG VERTEBROPLASTY FL
1 series · 13 of 24 positions shown · non-contrast
Comparison: Lumbar spine MRI 08/28/2007

CLINICAL DATA: Painful L2 compression fracture.

LUMBAR VERTEBROPLASTY FL

[Series 1: run · 13 of 36 slices shown]
[im 1/36]
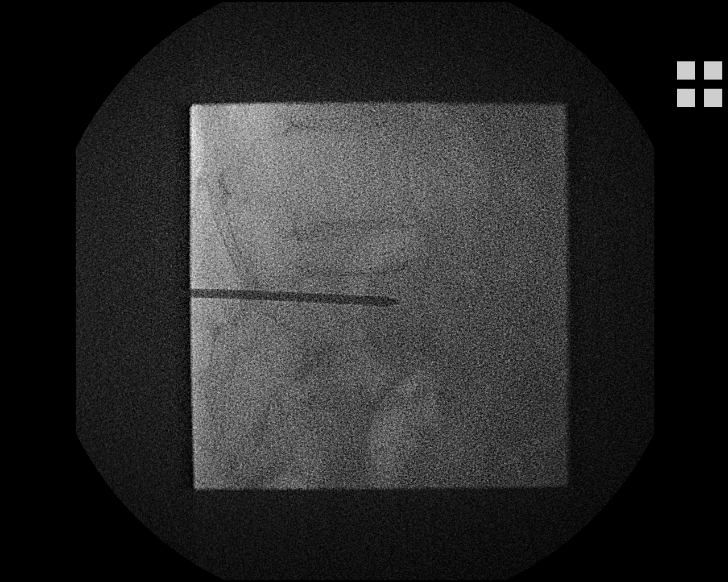
[im 4/36]
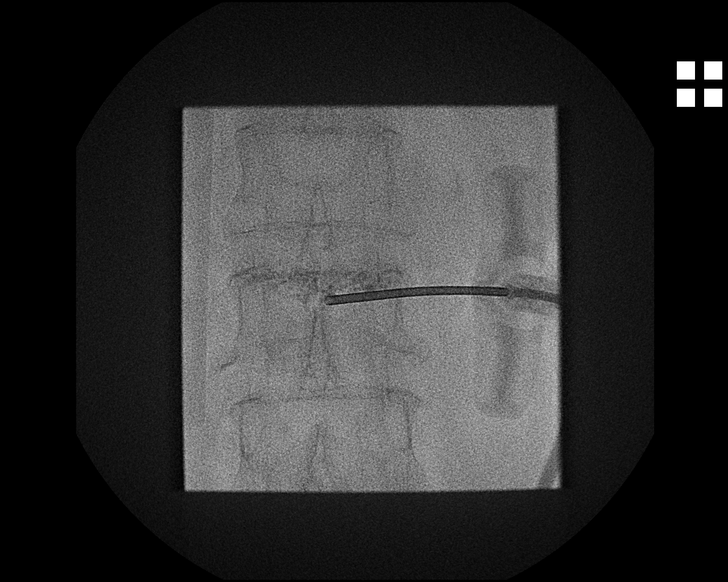
[im 7/36]
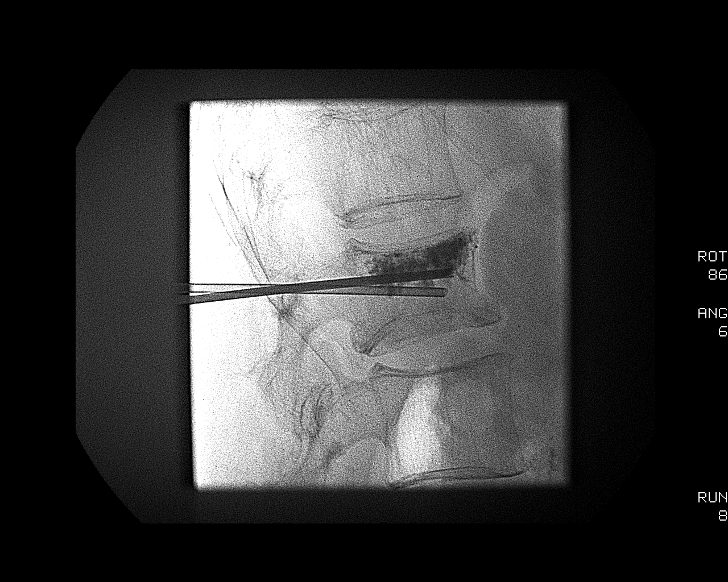
[im 10/36]
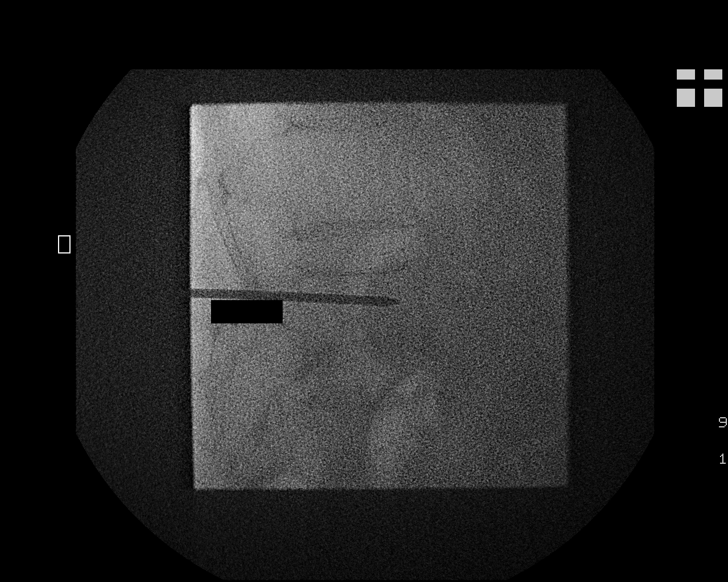
[im 13/36]
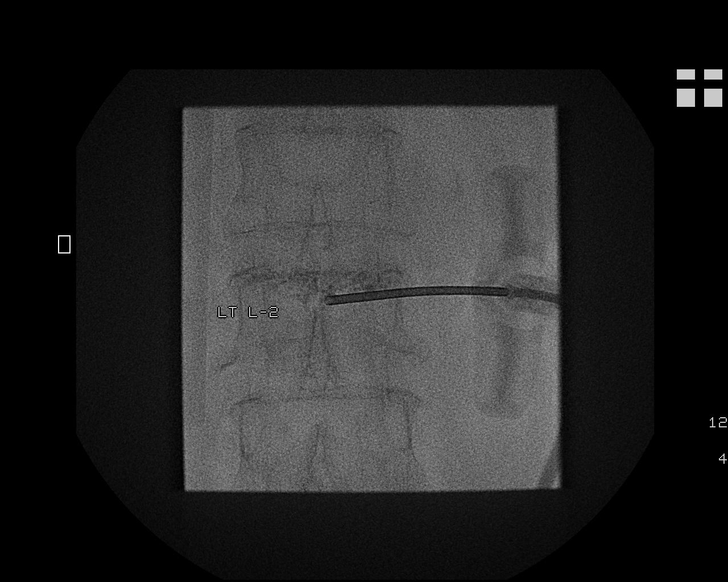
[im 16/36]
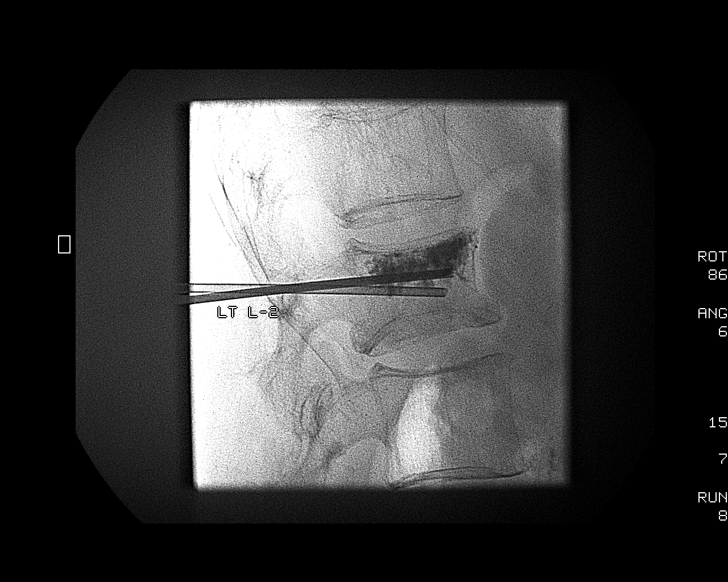
[im 19/36]
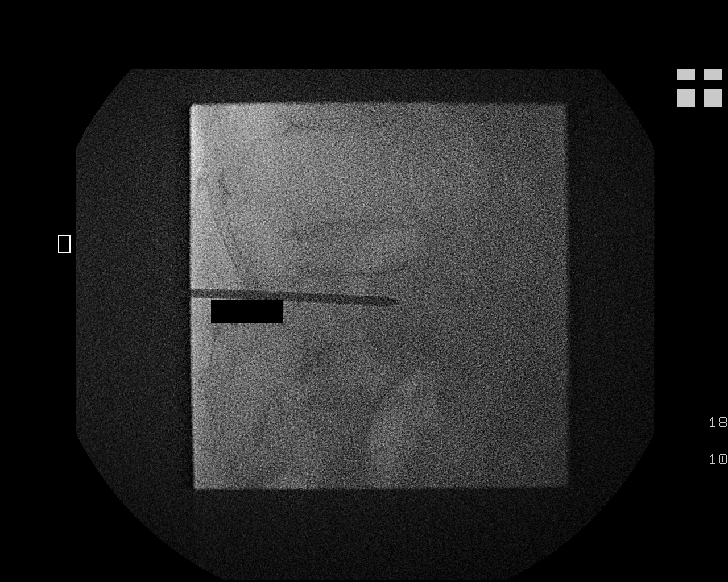
[im 20/36]
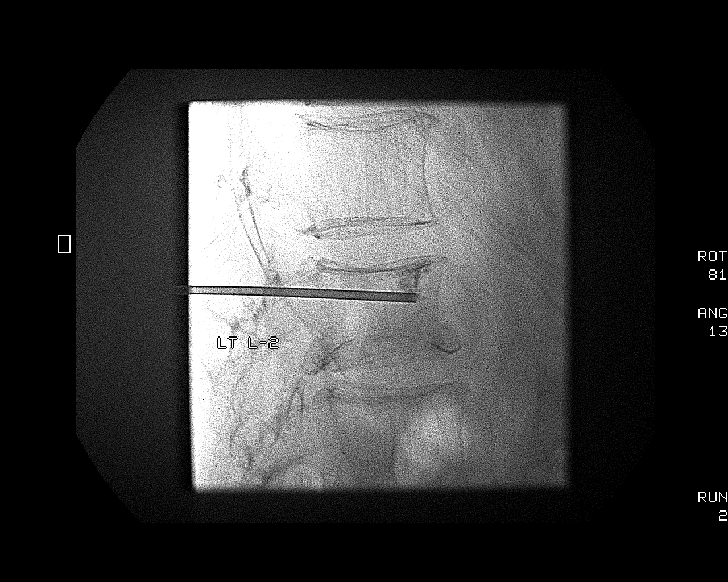
[im 23/36]
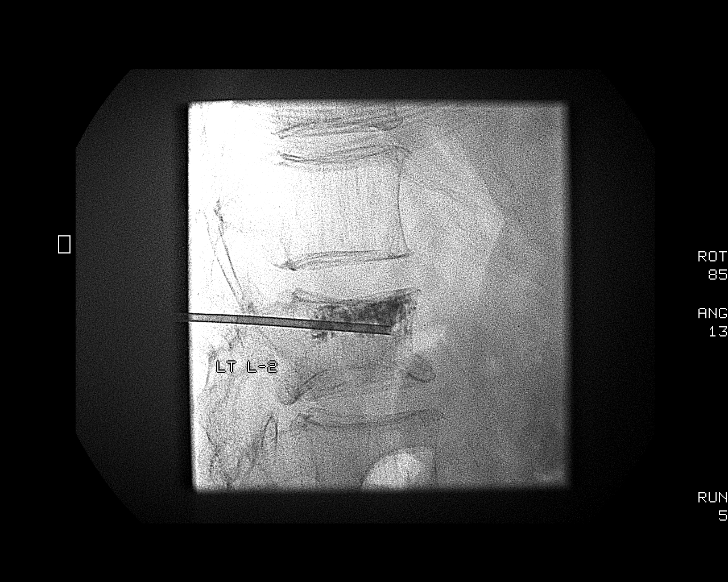
[im 26/36]
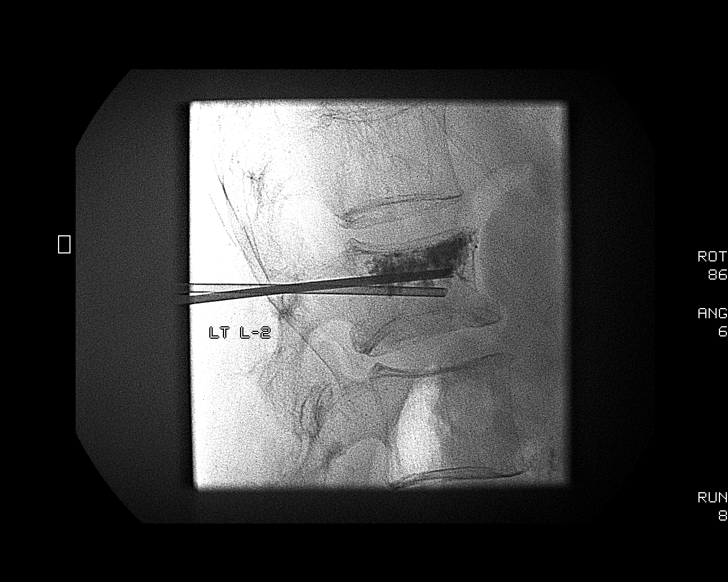
[im 29/36]
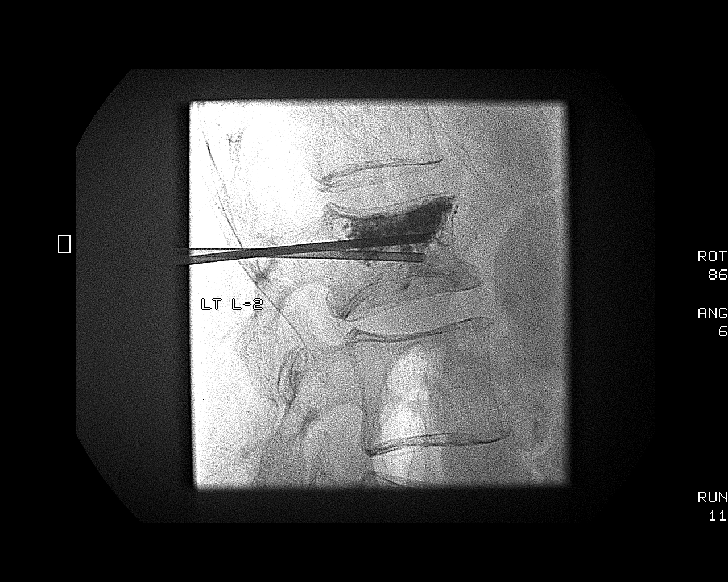
[im 32/36]
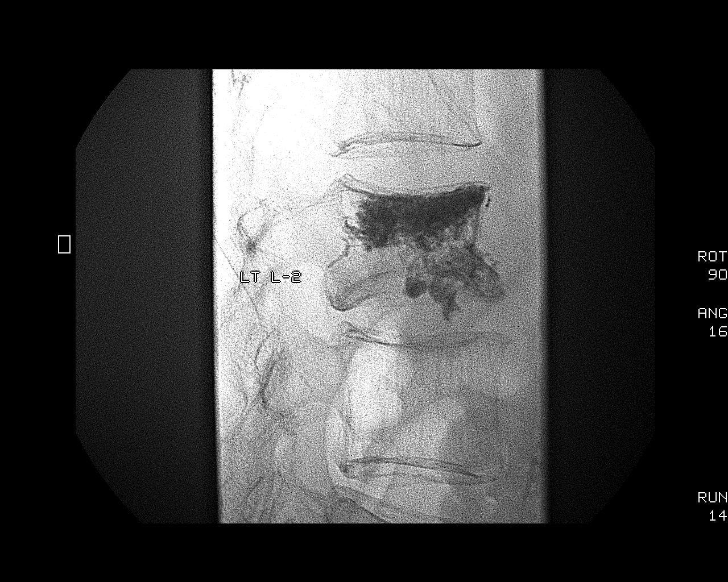
[im 36/36]
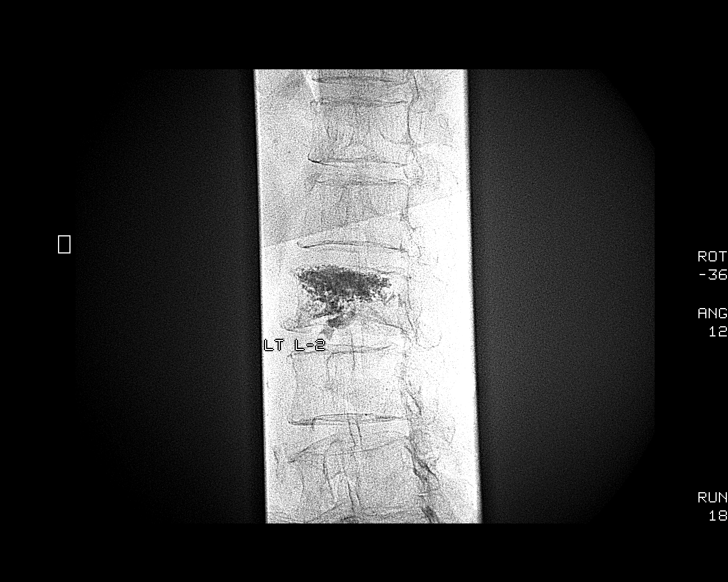

[13 of 24 positions shown; findings below may reference images not displayed]

Technique and findings:  An appropriate skin entry site was
determined fluoroscopically. Skin site was marked, prepped with
chlorhexidine, draped in usual sterile fashion, and infiltrated
locally with 1% lidocaine. Intravenous fentanyl and Versed were
administered as conscious sedation during continuous
cardiorespiratory monitoring by the radiology RN.

Under fluoroscopic guidance Mohamad Shukri Pathma trocar needle was advanced to
the anterior third of the involved vertebral body using a right
transpedicular approach. Tip was positioned near the midline.
Prepared opacified methylmethacrylate cement was administered under
fluoroscopic visualization, with good filling adjacent to the
superior   endplate  and across the midline. Because of minimal
filling in the inferior half of the vertebral body, a second
Joseline trocar needle was advanced from a left transpedicular
approach into the lower half of the L2 vertebral body, tip
positioned near the midline.  Additional cement was administered
until there was good filling from superior to inferior endplates to
support the anterior column.  A  breach in the inferior endplate
allowed a small amount of contrast to begin to extend into the L2-3
interspace, at which   point injection was discontinued.  The
trocar needles were removed. Patient tolerated the procedure well,
with no immediate complication.
IMPRESSION: 1. Technically successful lumbar vertebroplasty L2

## 2010-03-26 IMAGING — CT CT L SPINE W/O CM
3 of 4 series · 11 of 20 positions shown, 13 images · non-contrast
Comparison: Vertebroplasty images 09/10/2007, MRI lumbar spine
08/28/2007.]

CLINICAL DATA: 78-year-old female with increasing pain status post
L2 vertebroplasty.

CT LUMBAR SPINE WITHOUT CONTRAST
TECHNIQUE: Multidetector CT imaging of the lumbar spine was
performed without intravenous contrast administration. Multiplanar
CT image reconstructions were also generated.

[Series 2: l-spine helical · axial · 0.38mm/px · z∈[-313,-118]mm · 5 of 118 slices shown, 7 images]
[im 20/118  soft-tissue]
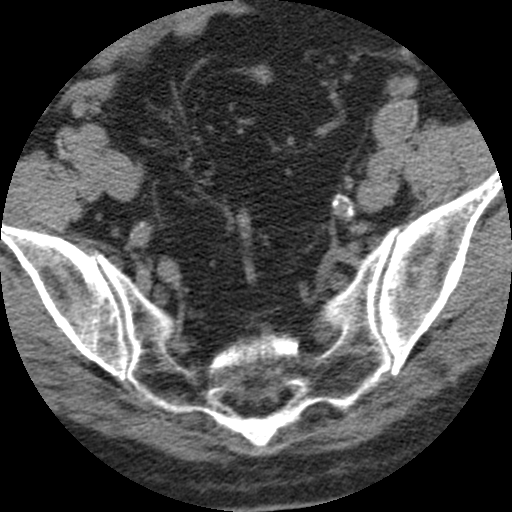
[im 20/118  bone]
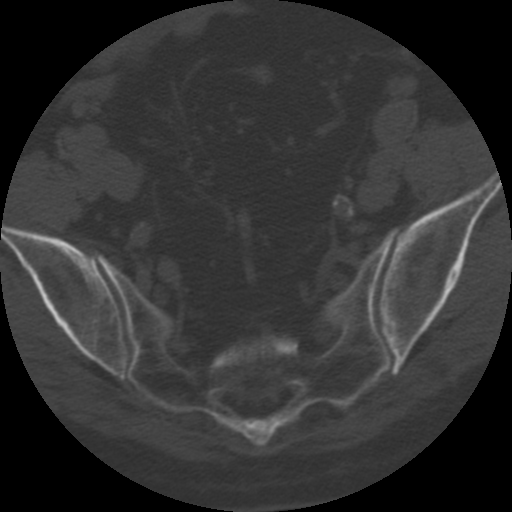
[im 40/118  bone]
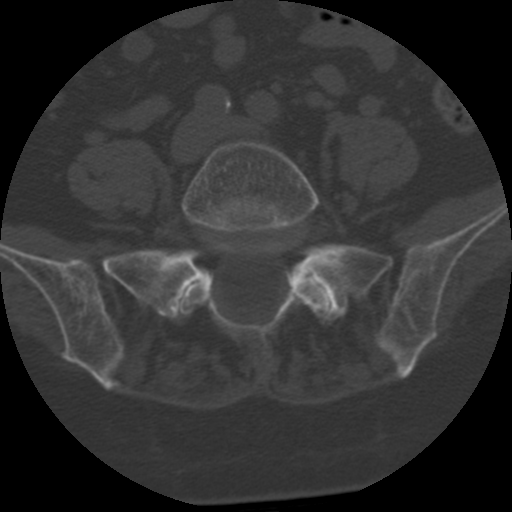
[im 59/118  bone]
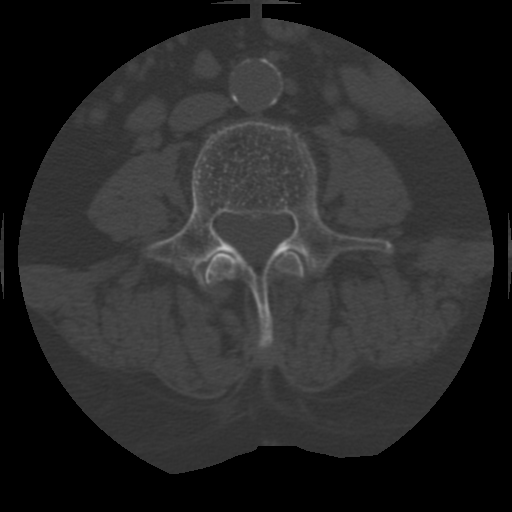
[im 79/118  bone]
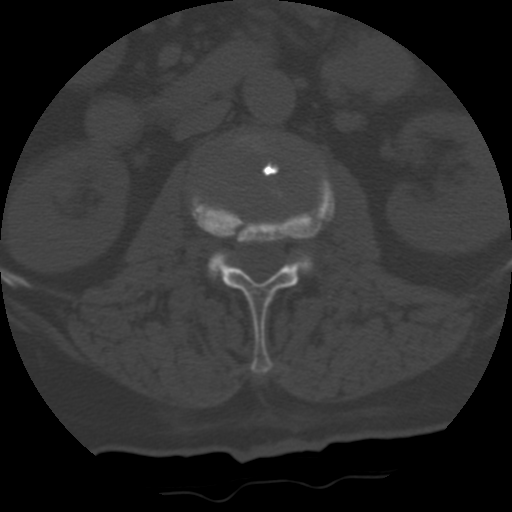
[im 98/118  soft-tissue]
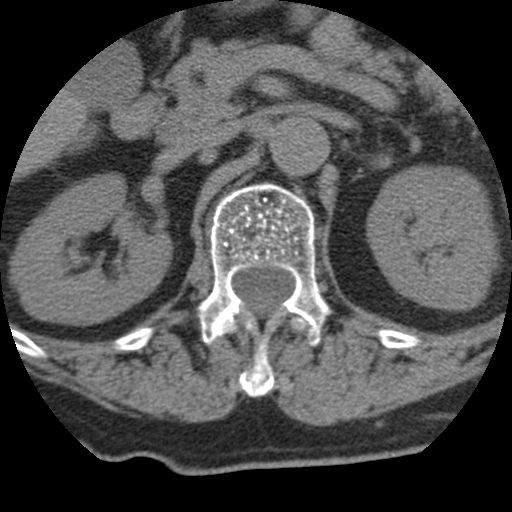
[im 98/118  bone]
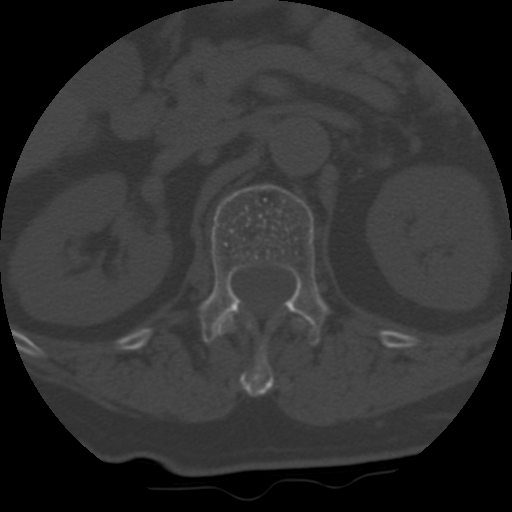

[Series 3: recon 2: l-spine helical · axial · 0.38mm/px · z∈[-313,-118]mm · 5 of 118 slices shown]
[im 20/118  bone]
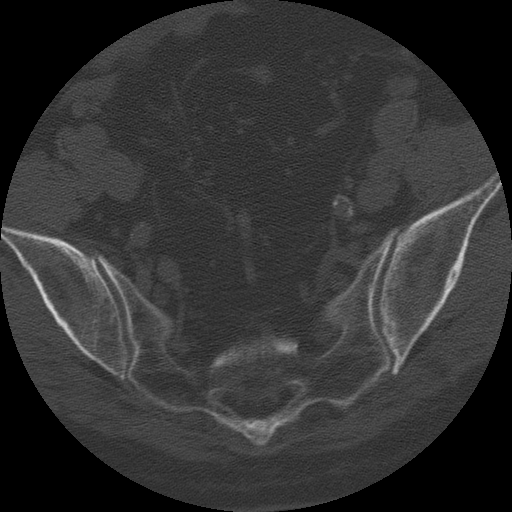
[im 40/118  bone]
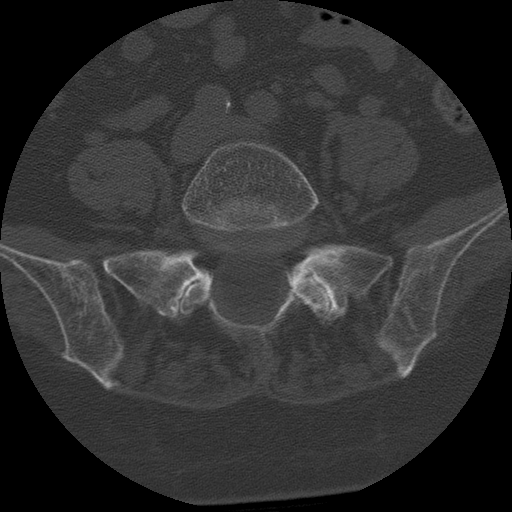
[im 59/118  bone]
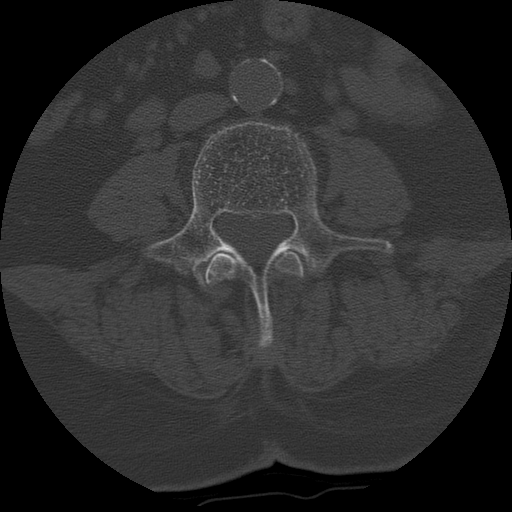
[im 79/118  bone]
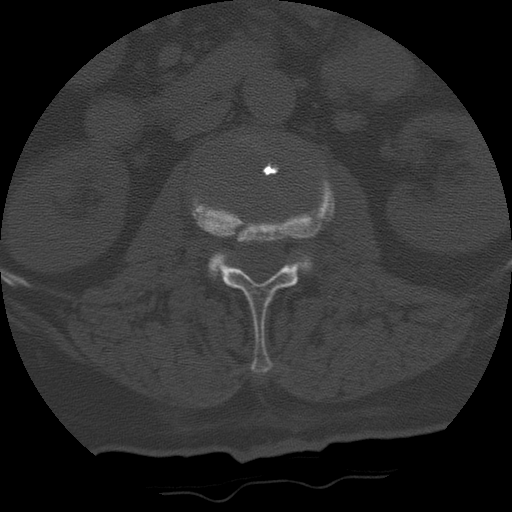
[im 98/118  bone]
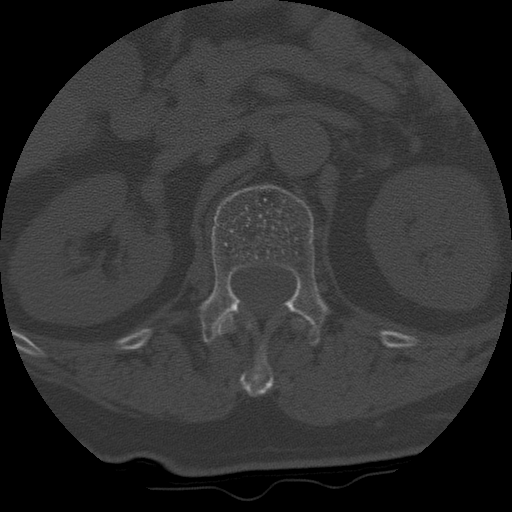

[Series 400: sag · sagittal · 0.59mm/px · 1 of 52 slices shown]
[im 26/52  bone]
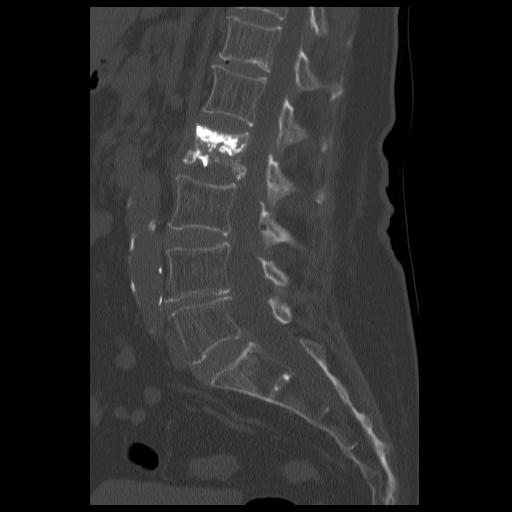

[11 of 20 positions shown; findings below may reference images not displayed]

FINDINGS: Visualized retroperitoneal and peritoneal viscera are
within normal limits.  There is dependent atelectasis at the left
lung base.

Visualized paraspinal soft tissues are normal aside from mild
paraspinal hematoma at and just below the L2 vertebral body level,
likely the sequelae of this compression fracture.  A small focus of
gas along the left posterior L2 vertebral body may be related to
the vertebroplasty.

Interval vertebroplasty is been performed.  Bone cement is seen to
be situated within the L2 vertebral body, along the superior
endplate and with some extension into the L2-L3 disc space.  As on
the comparison MRI, there is mild retropulsion of the inferior L2
posterior endplate fragment and this appears stable (AP thecal sac
measuring 14 mm, unchanged from prior).  A might new amount of
cement is seen in the ventral epidural space likely within the
basal vertebral venous plexus.

Diffuse advanced osteopenia is seen in the other lumbar levels with
no definite new or acute fracture identified.  The spinal canal is
seen to be relatively large throughout the lumbar spine.  Mild
circumferential disc, facet and ligament flavum degeneration at L3-
L4 is seen and appears stable.  No acute spinal or neural foraminal
stenosis is identified.  Lower lumbar facet degeneration also
stable.
IMPRESSION: 1.  No adverse features status post L2 vertebroplasty with stable
configuration of fracture fragments and stable AP dimension of the
spinal canal.
2.  Diffuse lumbar osteopenia, no definite acute fracture seen at
the remaining lumbar levels. Repeat MRI would be more sensitive as
clinically indicated.

## 2010-04-18 ENCOUNTER — Encounter: Payer: Self-pay | Admitting: Internal Medicine

## 2010-04-24 ENCOUNTER — Ambulatory Visit: Payer: Self-pay | Admitting: Internal Medicine

## 2010-05-03 ENCOUNTER — Encounter
Admission: RE | Admit: 2010-05-03 | Discharge: 2010-05-03 | Payer: Self-pay | Source: Home / Self Care | Attending: Neurology | Admitting: Neurology

## 2010-05-29 ENCOUNTER — Encounter: Payer: Self-pay | Admitting: Internal Medicine

## 2010-06-04 ENCOUNTER — Encounter: Payer: Self-pay | Admitting: Internal Medicine

## 2010-06-12 ENCOUNTER — Encounter: Payer: Self-pay | Admitting: Internal Medicine

## 2010-06-13 ENCOUNTER — Ambulatory Visit: Admission: RE | Admit: 2010-06-13 | Discharge: 2010-06-13 | Payer: Self-pay | Source: Home / Self Care

## 2010-06-13 ENCOUNTER — Encounter: Payer: Self-pay | Admitting: Physician Assistant

## 2010-06-13 ENCOUNTER — Ambulatory Visit (HOSPITAL_COMMUNITY)
Admission: RE | Admit: 2010-06-13 | Discharge: 2010-06-13 | Payer: Self-pay | Source: Home / Self Care | Attending: Cardiology | Admitting: Cardiology

## 2010-06-13 ENCOUNTER — Ambulatory Visit
Admission: RE | Admit: 2010-06-13 | Discharge: 2010-06-13 | Payer: Self-pay | Source: Home / Self Care | Attending: Cardiology | Admitting: Cardiology

## 2010-06-13 DIAGNOSIS — I428 Other cardiomyopathies: Secondary | ICD-10-CM

## 2010-06-14 ENCOUNTER — Telehealth (INDEPENDENT_AMBULATORY_CARE_PROVIDER_SITE_OTHER): Payer: Self-pay | Admitting: *Deleted

## 2010-06-14 ENCOUNTER — Encounter (HOSPITAL_COMMUNITY): Payer: Medicare Other

## 2010-06-14 DIAGNOSIS — Z01812 Encounter for preprocedural laboratory examination: Secondary | ICD-10-CM | POA: Insufficient documentation

## 2010-06-14 DIAGNOSIS — M1710 Unilateral primary osteoarthritis, unspecified knee: Secondary | ICD-10-CM | POA: Insufficient documentation

## 2010-06-14 HISTORY — PX: TOTAL KNEE ARTHROPLASTY: SHX125

## 2010-06-15 ENCOUNTER — Telehealth (INDEPENDENT_AMBULATORY_CARE_PROVIDER_SITE_OTHER): Payer: Self-pay | Admitting: *Deleted

## 2010-06-15 NOTE — Assessment & Plan Note (Signed)
Summary: FREQUENT HEADACHES W/INTENSITY/RH......   Vital Signs:  Patient profile:   77 year old female Weight:      179 pounds Pulse rate:   75 / minute Pulse rhythm:   regular BP sitting:   132 / 86  (left arm) Cuff size:   large  Vitals Entered By: Army Fossa CMA (March 24, 2010 3:40 PM) CC: Pt here to discuss Migraines.  Comments Not light or sound sensivtive.  [facility] [address]    History of Present Illness: 2 days ago, she had a moderate "headache", different from her migraines, located in the forehead, lasted about one minute. there were no associated symptoms, no nausea. The next day, she noted her eyelids to be droopy bilaterally. Wonders if she had a stroke.  Review of systems Denies facial weakness No slurred speech No diplopia or blurred vision no palpitations She has a long history of migraines, her migraines have not been worse lately, last episode about 10 days ago. She never let them progress to full migraines and treats them when they are auras. The only change is that her aura is slightly different lately  Current Medications (verified): 1)  Metoprolol Tartrate 50 Mg Tabs (Metoprolol Tartrate) .... Take One Tablet Two Times A Day 2)  Omeprazole 40 Mg Cpdr (Omeprazole) .... Take One Capsule Daily 3)  Ativan 0.5 Mg  Tabs (Lorazepam) .... Three Times A Day As Needed 4)  Tens Unit .... Dx 805.8 5)  Stool Softener 100 Mg Caps (Docusate Sodium) .... Take One Tablet Daily 6)  Tums Ultra 1000 1000 Mg Chew (Calcium Carbonate Antacid) .... As Needed 7)  Adult Aspirin Ec Low Strength 81 Mg Tbec (Aspirin) .... Take 1 Tablet By Mouth Once A Day 8)  Levsin/sl 0.125 Mg Subl (Hyoscyamine Sulfate) .... 2 Tablets By Mouth Sublingually Four Times A Day As Needed 9)  Furosemide 20 Mg Tabs (Furosemide) .... Take One Tablet Once Daily As Needed 10)  Tylenol With Codeine #3 300-30 Mg Tabs (Acetaminophen-Codeine) .... Take One Tablet Every 8 Hours As Needed For  Migraine 11)  Vitamin D 1000 Unit Tabs (Cholecalciferol) .... Take 1 Tab Once Daily  Allergies (verified): 1)  ! Pcn 2)  ! Asa 3)  ! Accupril 4)  ! * Toprol 5)  ! Lescol 6)  ! Zocor 7)  ! Lipitor 8)  ! * Zetia 9)  ! * Crestor 10)  ! Milk of Magnesia (Magnesium Hydroxide) 11)  ! Diovan  Past History:  Past Medical History: Reviewed history from 10/19/2009 and no changes required. 1. Headache - migraines 2. Hypertension 3. Hyperlipidemia 4. Osteoporosis 5. Coronary artery disease: Mild nonobstructive disease on cath in 2003.  6  CVA 7. GI issues: GERD Hiatal hernia, Hemorrhoids, Hyperplastic colon polyp, 06/2001, Functional LLQ pain, h/o diverticuli 8. Obstructive sleep apnea 9. varicose veins 10. h/o R leg DVT after a venous ablation 11. OA with bilateral knee pain 12. 4-09  L2 fracture status post vertebroplasty of L2 September 10, 2007 performed  by IR 13.  Depression/anxiety -lost husband  34.  Probable Takotsubo cardiomyopathy: Severe chest pain with normal cath in 1994.  Severe chest pain in 2003 with widespread T wave inversions on ECG.  Cath with minimal coronary disease but LV-gram showed periapical severe hypokinesis and basilar hyperkinesia (EF 40%).  Last echo in 4/09 confirmed full LV functional recovery with EF 60%, no regional wall motion abnormalities, mild to moderate LVH.   Past Surgical History: Reviewed history from 05/10/2009 and no  changes required. Appendectomy left ovarian cyst 3/07 - stress test neg Tonsillectomy Back Surgery Tubal Ligation Dental extraction  L maxillary molar Kyphoplasty 08/2007  Physical Exam  General:  alert and well-developed.   Lungs:  normal respiratory effort, no intercostal retractions, no accessory muscle use, and normal breath sounds.   Heart:  normal rate, regular rhythm, and no murmur.   Neurologic:  alert, oriented x3 Face symmetric Pupils equal and reactive Speech clear Memory within normal Strength symmetric  throughout Her eyelids are indeed slightly droopy but symmetric Psych:  Oriented X3, memory intact for recent and remote, normally interactive, good eye contact, not anxious appearing, and not depressed appearing.     Impression & Recommendations:  Problem # 1:  HEADACHE (ICD-784.0) history of migraine headaches 2 days ago had a different cephalalgia, lasted one minute, doubt that represents a TIA or similar. Recommend observation, call  any time she has an intense headache. also reminded patient the symptoms of a TIA (slurred speech, nausea, dizziness, focal deficits) she is to call 911 if that is the case Her updated medication list for this problem includes:    Metoprolol Tartrate 50 Mg Tabs (Metoprolol tartrate) .Marland Kitchen... Take one tablet two times a day    Adult Aspirin Ec Low Strength 81 Mg Tbec (Aspirin) .Marland Kitchen... Take 1 tablet by mouth once a day    Tylenol With Codeine #3 300-30 Mg Tabs (Acetaminophen-codeine) .Marland Kitchen... Take one tablet every 8 hours as needed for migraine  Problem # 2:  HYPERTENSION (ICD-401.9) at goal  The following medications were removed from the medication list:    Cozaar 50 Mg Tabs (Losartan potassium) .Marland Kitchen... 1 by mouth once daily Her updated medication list for this problem includes:    Metoprolol Tartrate 50 Mg Tabs (Metoprolol tartrate) .Marland Kitchen... Take one tablet two times a day    Furosemide 20 Mg Tabs (Furosemide) .Marland Kitchen... Take one tablet once daily as needed  BP today: 132/86 Prior BP: 122/72 (10/19/2009)  Labs Reviewed: K+: 3.9 (08/01/2009) Creat: : 0.8 (08/01/2009)   Chol: 234 (10/19/2009)   HDL: 58.10 (10/19/2009)   LDL: DEL (06/25/2007)   TG: 74.0 (10/19/2009)  Complete Medication List: 1)  Metoprolol Tartrate 50 Mg Tabs (Metoprolol tartrate) .... Take one tablet two times a day 2)  Omeprazole 40 Mg Cpdr (Omeprazole) .... Take one capsule daily 3)  Ativan 0.5 Mg Tabs (Lorazepam) .... Three times a day as needed 4)  Tens Unit  .... Dx 805.8 5)  Stool Softener  100 Mg Caps (Docusate sodium) .... Take one tablet daily 6)  Tums Ultra 1000 1000 Mg Chew (Calcium carbonate antacid) .... As needed 7)  Adult Aspirin Ec Low Strength 81 Mg Tbec (Aspirin) .... Take 1 tablet by mouth once a day 8)  Levsin/sl 0.125 Mg Subl (Hyoscyamine sulfate) .... 2 tablets by mouth sublingually four times a day as needed 9)  Furosemide 20 Mg Tabs (Furosemide) .... Take one tablet once daily as needed 10)  Tylenol With Codeine #3 300-30 Mg Tabs (Acetaminophen-codeine) .... Take one tablet every 8 hours as needed for migraine 11)  Vitamin D 1000 Unit Tabs (Cholecalciferol) .... Take 1 tab once daily   Orders Added: 1)  Est. Patient Level III [78469]

## 2010-06-15 NOTE — Assessment & Plan Note (Signed)
Summary: 6 month followup//kn   Vital Signs:  Patient profile:   77 year old female Height:      66 inches Weight:      175 pounds BMI:     28.35 Temp:     97.6 degrees F oral Pulse rate:   74 / minute BP sitting:   130 / 72  (left arm)  Vitals Entered By: Jeremy Johann CMA (April 24, 2010 12:51 PM) CC: 6 month f/u, not fasting   History of Present Illness: ROV  She has been headache free until an hour ago when she developed a " classic headache"  Current Medications (verified): 1)  Metoprolol Tartrate 50 Mg Tabs (Metoprolol Tartrate) .... Take One Tablet Two Times A Day 2)  Omeprazole 40 Mg Cpdr (Omeprazole) .... Take One Capsule Daily 3)  Ativan 0.5 Mg  Tabs (Lorazepam) .... Three Times A Day As Needed 4)  Tens Unit .... Dx 805.8 5)  Stool Softener 100 Mg Caps (Docusate Sodium) .... Take One Tablet Daily 6)  Tums Ultra 1000 1000 Mg Chew (Calcium Carbonate Antacid) .... As Needed 7)  Adult Aspirin Ec Low Strength 81 Mg Tbec (Aspirin) .... Take 1 Tablet By Mouth Once A Day 8)  Furosemide 20 Mg Tabs (Furosemide) .... Take One Tablet Once Daily As Needed 9)  Tylenol With Codeine #3 300-30 Mg Tabs (Acetaminophen-Codeine) .... Take One Tablet Every 8 Hours As Needed For Migraine 10)  Vitamin D 1000 Unit Tabs (Cholecalciferol) .... Take 1 Tab Once Daily 11)  Vitamin E  (Vitamin E) .... Take 1 Tab Once Daily  Allergies (verified): 1)  ! Pcn 2)  ! Asa 3)  ! Accupril 4)  ! * Toprol 5)  ! Lescol 6)  ! Zocor 7)  ! Lipitor 8)  ! * Zetia 9)  ! * Crestor 10)  ! Milk of Magnesia (Magnesium Hydroxide) 11)  ! Diovan  Past History:  Past Medical History: Reviewed history from 10/19/2009 and no changes required. 1. Headache - migraines 2. Hypertension 3. Hyperlipidemia 4. Osteoporosis 5. Coronary artery disease: Mild nonobstructive disease on cath in 2003.  6  CVA 7. GI issues: GERD Hiatal hernia, Hemorrhoids, Hyperplastic colon polyp, 06/2001, Functional LLQ pain, h/o  diverticuli 8. Obstructive sleep apnea 9. varicose veins 10. h/o R leg DVT after a venous ablation 11. OA with bilateral knee pain 12. 4-09  L2 fracture status post vertebroplasty of L2 September 10, 2007 performed  by IR 13.  Depression/anxiety -lost husband  60.  Probable Takotsubo cardiomyopathy: Severe chest pain with normal cath in 1994.  Severe chest pain in 2003 with widespread T wave inversions on ECG.  Cath with minimal coronary disease but LV-gram showed periapical severe hypokinesis and basilar hyperkinesia (EF 40%).  Last echo in 4/09 confirmed full LV functional recovery with EF 60%, no regional wall motion abnormalities, mild to moderate LVH.   Past Surgical History: Reviewed history from 05/10/2009 and no changes required. Appendectomy left ovarian cyst 3/07 - stress test neg Tonsillectomy Back Surgery Tubal Ligation Dental extraction  L maxillary molar Kyphoplasty 08/2007  Social History: Married, lost husband 07-29-07.  Lives in [place].  lives in a town house, her daughter moved in w/ her  driving  Former Smoker: quit 1990 Alcohol use-yes children x 3 ( 2 in [place] , see them occasionaly) Daily Caffeine Use coffee  Review of Systems       no check an ambulatory BP is Denies chest pain or shortness of breath. Occasionally  has palpitations without associated symptoms She saw neurology, blood work was drawn, MRI is pending She also is due to see cardiology and plans to do that.  Physical Exam  General:  alert and well-developed.   Lungs:  normal respiratory effort, no intercostal retractions, no accessory muscle use, and normal breath sounds.   Heart:  normal rate, regular rhythm, and no murmur.   Extremities:  no pretibial edema bilaterally   Neurologic:  alert, oriented x3 Face symmetric Speech clear Memory within normal Strength symmetric throughout  Psych:  not anxious appearing and not depressed appearing.     Impression & Recommendations:  Problem #  1:  HYPERTENSION (ICD-401.9) at goal  Her updated medication list for this problem includes:    Metoprolol Tartrate 50 Mg Tabs (Metoprolol tartrate) .Marland Kitchen... Take one tablet two times a day    Furosemide 20 Mg Tabs (Furosemide) .Marland Kitchen... Take one tablet once daily as needed  BP today: 130/72 Prior BP: 132/86 (03/24/2010)  Labs Reviewed: K+: 3.9 (08/01/2009) Creat: : 0.8 (08/01/2009)   Chol: 234 (10/19/2009)   HDL: 58.10 (10/19/2009)   LDL: DEL (06/25/2007)   TG: 74.0 (10/19/2009)  Problem # 2:  ANXIETY DEPRESSION (ICD-300.4) well-controlled Her daughter is moving in with her, the patient is happy about this   Problem # 3:  HYPERLIPIDEMIA (ICD-272.4) intolerant to most medicines Labs Reviewed: SGOT: 23 (06/25/2007)   SGPT: 16 (06/25/2007)   HDL:58.10 (10/19/2009), 46.9 (06/25/2007)  LDL:DEL (06/25/2007)  Chol:234 (10/19/2009), 221 (06/25/2007)  Trig:74.0 (10/19/2009), 83 (06/25/2007)  Problem # 4:  HEADACHE (ICD-784.0) symptoms well controlled except for today --- she developed a classic headache an hour ago plans to  go home, rest and take her usual meds Her updated medication list for this problem includes:    Metoprolol Tartrate 50 Mg Tabs (Metoprolol tartrate) .Marland Kitchen... Take one tablet two times a day    Adult Aspirin Ec Low Strength 81 Mg Tbec (Aspirin) .Marland Kitchen... Take 1 tablet by mouth once a day    Tylenol With Codeine #3 300-30 Mg Tabs (Acetaminophen-codeine) .Marland Kitchen... Take one tablet every 8 hours as needed for migraine  Complete Medication List: 1)  Metoprolol Tartrate 50 Mg Tabs (Metoprolol tartrate) .... Take one tablet two times a day 2)  Omeprazole 40 Mg Cpdr (Omeprazole) .... Take one capsule daily 3)  Ativan 0.5 Mg Tabs (Lorazepam) .... Three times a day as needed 4)  Tens Unit  .... Dx 805.8 5)  Stool Softener 100 Mg Caps (Docusate sodium) .... Take one tablet daily 6)  Tums Ultra 1000 1000 Mg Chew (Calcium carbonate antacid) .... As needed 7)  Adult Aspirin Ec Low Strength 81 Mg  Tbec (Aspirin) .... Take 1 tablet by mouth once a day 8)  Furosemide 20 Mg Tabs (Furosemide) .... Take one tablet once daily as needed 9)  Tylenol With Codeine #3 300-30 Mg Tabs (Acetaminophen-codeine) .... Take one tablet every 8 hours as needed for migraine 10)  Vitamin D 1000 Unit Tabs (Cholecalciferol) .... Take 1 tab once daily 11)  Vitamin E (vitamin E)  .... Take 1 tab once daily  Patient Instructions: 1)  Please schedule a follow-up appointment in 6 months .  2)  please get your flu shot at your pharmacy    Orders Added: 1)  Est. Patient Level III [40981]

## 2010-06-15 NOTE — Assessment & Plan Note (Signed)
Summary: np6/hx of CAD/jml   Primary Provider:  Willow Ora, MD  CC:  new pt/hx CAD.  History of Present Illness: 77 yo with history of HTN, prior stroke, and prior cardiomyopathy presents to establish cardiology followup. She was seen by Dr. Aleen Campi in the past.  Patient has an interesting cardiac history.  She had an episode of severe chest pain in 1994 and had a heart catheterization at that time that per her report showed no heart artery blockages.  In 2003, she had another episode of sudden onset chest and back pain.  She does not remember what triggered the episode.  She had extensive T wave inversions on ECG.  Left heart cath showed nonobstructive CAD but left ventriculogram showed periapical severe hypokinesis with basal hyperkinesis.  At the time, it was thought that she had had a large MI from LAD occlusion with spontaneous recanalization of the vessel.  However, I suspect that she probably had a stress (Takotsubo) cardiomyopathy as her ECG and left heart cath fit this picture fairly well.  Since that time, she has had no further cardiac problems.  Most recent echo in 2009 showed normal LV systolic function and wall motion.    Patient lives by herself and is fairly active.  She does yardwork (raking, trimming bushes, mowing grass) without significant dyspnea.  She is able to walk the aisles in [employer] without trouble.  She can climb a flight of steps with no dyspnea but has some trouble because of her knee pain. Knee osteoarthritis limits the patient more than cardiopulmonary symptoms.  No chest pain.  No orthopnea/PND.    ECG: NSR, Qs in V1 and V2 are nonspecific  Labs (3/11): K 3.9, creatinine 0.8, normal TSH  Current Medications (verified): 1)  Metoprolol Tartrate 50 Mg Tabs (Metoprolol Tartrate) .... Take One Tablet Two Times A Day 2)  Omeprazole 40 Mg Cpdr (Omeprazole) .... Take One Capsule Daily 3)  Ativan 0.5 Mg  Tabs (Lorazepam) .... Three Times A Day As Needed 4)  Tens Unit .... Dx  805.8 5)  Stool Softener 100 Mg Caps (Docusate Sodium) .... Take One Tablet Daily 6)  Tums Ultra 1000 1000 Mg Chew (Calcium Carbonate Antacid) .... As Needed 7)  Adult Aspirin Ec Low Strength 81 Mg Tbec (Aspirin) .... Take 1 Tablet By Mouth Once A Day 8)  Levsin/sl 0.125 Mg Subl (Hyoscyamine Sulfate) .... 2 Tablets By Mouth Sublingually Four Times A Day As Needed 9)  Cozaar 50 Mg Tabs (Losartan Potassium) .Marland Kitchen.. 1 By Mouth Once Daily 10)  Fosamax 70 Mg Tabs (Alendronate Sodium) .... One By Mouth Weekly 11)  Ergocalciferol 50000 Unit Caps (Ergocalciferol) .Marland Kitchen.. 1 By Mouth Qwk X 12 Weeks Then Start Otc Vitamin D 12)  Furosemide 20 Mg Tabs (Furosemide) .... Take One Tablet Once Daily As Needed 13)  Tylenol With Codeine #3 300-30 Mg Tabs (Acetaminophen-Codeine) .... Take One Tablet Every 8 Hours As Needed For Migraine  Allergies (verified): 1)  ! Pcn 2)  ! Asa 3)  ! Accupril 4)  ! * Toprol 5)  ! Lescol 6)  ! Zocor 7)  ! Lipitor 8)  ! * Zetia 9)  ! * Crestor 10)  ! Milk of Magnesia (Magnesium Hydroxide) 11)  ! Diovan  Past History:  Past Medical History: 1. Headache - migraines 2. Hypertension 3. Hyperlipidemia 4. Osteoporosis 5. Coronary artery disease: Mild nonobstructive disease on cath in 2003.  6  CVA 7. GI issues: GERD Hiatal hernia, Hemorrhoids, Hyperplastic colon polyp, 06/2001,  Functional LLQ pain, h/o diverticuli 8. Obstructive sleep apnea 9. varicose veins 10. h/o R leg DVT after a venous ablation 11. OA with bilateral knee pain 12. 4-09  L2 fracture status post vertebroplasty of L2 September 10, 2007 performed  by IR 13.  Depression/anxiety -lost husband  68.  Probable Takotsubo cardiomyopathy: Severe chest pain with normal cath in 1994.  Severe chest pain in 2003 with widespread T wave inversions on ECG.  Cath with minimal coronary disease but LV-gram showed periapical severe hypokinesis and basilar hyperkinesia (EF 40%).  Last echo in 4/09 confirmed full LV functional  recovery with EF 60%, no regional wall motion abnormalities, mild to moderate LVH.   Family History: Reviewed history from 12/27/2008 and no changes required. colon polyps--cousin colon ca-- cousin Family History of Breast Cancer: Mother, Sister Family History of Heart Disease, MI s: Father Mother Multiple Myeloma  Social History: Married, lost husband 07-29-07.  Lives in [place].  lives by self driving  Former Smoker: quit 1990 Alcohol use-yes children x 3 ( 2 in [place] , see them occasionaly) Daily Caffeine Use coffee  Review of Systems       All systems reviewed and negative except as per HPI.   Vital Signs:  Patient profile:   77 year old female Height:      66.5 inches Weight:      174 pounds BMI:     27.76 Pulse rate:   64 / minute Pulse rhythm:   regular BP sitting:   134 / 80  (left arm) Cuff size:   regular  Vitals Entered By: Judithe Modest CMA (August 03, 2009 11:06 AM)  Physical Exam  General:  Well developed, well nourished, in no acute distress. Head:  normocephalic and atraumatic Nose:  no deformity, discharge, inflammation, or lesions Mouth:  Teeth, gums and palate normal. Oral mucosa normal. Neck:  Neck supple, no JVD. No masses, thyromegaly or abnormal cervical nodes. Lungs:  Clear bilaterally to auscultation and percussion. Heart:  Non-displaced PMI, chest non-tender; regular rate and rhythm, S1, S2 without murmurs, rubs or gallops. Carotid upstroke normal, no bruit. Pedals normal pulses. No edema, no varicosities. Abdomen:  Bowel sounds positive; abdomen soft and non-tender without masses, organomegaly, or hernias noted. No hepatosplenomegaly. Msk:  Back normal, normal gait. Muscle strength and tone normal. Extremities:  No clubbing or cyanosis. Neurologic:  Alert and oriented x 3. Skin:  Intact without lesions or rashes. Psych:  Normal affect.   Impression & Recommendations:  Problem # 1:  CORONARY ARTERY DISEASE (ICD-414.00) Mild  nonobstructive CAD on 2003 cath.  No ischemic-type symptoms.  Continue ASA 81 mg daily, beta blocker, and ARB.  Would keep LDL at least < 100.   Problem # 2:  CARDIOMYOPATHY (ICD-425.4) Patient had LV-gram showing periapical severe hypokinesis and basiler hyperkinesia with minimal coronary disease in 2003.  Suspect that this was a stress (Takotsubo-type) cardiomyopathy in hindsight.  Echo in 2009 showed normal wall motion and EF.  She likely will be at risk of recurrence in the future, would make sure to continue a beta blocker (she is on metoprolol).  She appears euvolemic on exam and has minimal exertional dyspnea (limited more by knees).  Will repeat an echocardiogram to make sure that systolic function remains overall normal.    Other Orders: EKG w/ Interpretation (93000) Echocardiogram (Echo)  Patient Instructions: 1)  Your physician wants you to follow-up in: 1 year with Dr Marca Ancona. You will receive a reminder letter in the mail  two months in advance. If you don't receive a letter, please call our office to schedule the follow-up appointment. 2)  Your physician has requested that you have an echocardiogram.  Echocardiography is a painless test that uses sound waves to create images of your heart. It provides your doctor with information about the size and shape of your heart and how well your heart's chambers and valves are working.  This procedure takes approximately one hour. There are no restrictions for this procedure.

## 2010-06-15 NOTE — Assessment & Plan Note (Signed)
Summary: acute only - cold sxs/swh   Vital Signs:  Patient profile:   77 year old female Height:      66.5 inches Weight:      176.4 pounds Temp:     98.5 degrees F BP sitting:   120 / 80  Vitals Entered By: Shary Decamp (May 10, 2009 10:45 AM) CC: acute only Comments  - dysuria x 3 days  - sinus congestion, pain, pressure x 3 days  - teeth bleeding yest  - hoarse  - using OTC sinus med [facility]  May 10, 2009 10:46 AM    History of Present Illness: 3 days, history of UTI and URI symptoms. Yesterday he had a single episode of bleeding from the anterior lower gum  Current Medications (verified): 1)  Metoprolol Tartrate 50 Mg Tabs (Metoprolol Tartrate) .... Take One Tablet Two Times A Day 2)  Omeprazole 40 Mg Cpdr (Omeprazole) .... Take Two Tablets Daily Before Meals 3)  Ativan 0.5 Mg  Tabs (Lorazepam) .... Three Times A Day As Needed 4)  Tens Unit .... Dx 805.8 5)  Ra Vitamin A & D 5000-400 Unit Caps (Vitamins A & D) .... Take One Tablet Daily 6)  Stool Softener 100 Mg Caps (Docusate Sodium) .... Take One Tablet Daily 7)  Tums Ultra 1000 1000 Mg Chew (Calcium Carbonate Antacid) .... As Needed 8)  Adult Aspirin Ec Low Strength 81 Mg Tbec (Aspirin) .... Take 1 Tablet By Mouth Once A Day 9)  Levsin/sl 0.125 Mg Subl (Hyoscyamine Sulfate) .... 2 Tablets By Mouth Sublingually Four Times A Day As Needed 10)  Cozaar 50 Mg Tabs (Losartan Potassium) .Marland Kitchen.. 1 By Mouth Once Daily 11)  Clotrimazole 100 Mg Tabs (Clotrimazole) .... Qid 12)  Tylenol With Codeine #3 300-30 Mg Tabs (Acetaminophen-Codeine) .Marland Kitchen.. 1 By Mouth Every 8 Hours As Needed Migraine Ha 13)  Fluorouracil 5 % Crea (Fluorouracil)  Allergies (verified): 1)  ! Pcn 2)  ! Asa 3)  ! Accupril 4)  ! * Toprol 5)  ! Lescol 6)  ! Zocor 7)  ! Lipitor 8)  ! * Zetia 9)  ! * Crestor 10)  ! Milk of Magnesia (Magnesium Hydroxide) 11)  ! Diovan  Past History:  Past Medical History: Reviewed history from 08/16/2008  and no changes required. Headache - migraines Hypertension varicose veins Osteoporosis Hyperlipidemia Diverticulosis, colon GERD  (HH) Coronary artery disease Cerebrovascular accident, hx of h/o R leg DVT after a venous ablation 4-09  L2 fracture status post vertebroplasty of L2 September 10, 2007 performed  by IR 4-09 Depression/anxiety -lost husband  Obstructive sleep apnea Hiatal hernia Hemorrhoids Hyperplastic colon polyp, 06/2001 Functional LLQ pain Osteoarthritis  Past Surgical History: Appendectomy left ovarian cyst 3/07 - stress test neg Tonsillectomy Back Surgery Tubal Ligation Dental extraction  L maxillary molar Kyphoplasty 08/2007  Social History: Reviewed history from 12/27/2008 and no changes required. Married, lost husband 07-29-07 lives by self driving  Former Smoker: quit 1990 Alcohol use-yes children x 3 ( 2 in [place] , see them occasionaly) Daily Caffeine Use coffee  Review of Systems General:  denies fever, no nausea/vomiting/diarrhea. Denies back pain or abdominal pain. No gross hematuria. does have mild  cough with green sputum. No chest congestion.  Physical Exam  General:  alert and well-developed.  nontoxic, in no distress Head:  not tender in the sinus area Nose:  slightly congested without discharge Mouth:  no redness or discharge (+) hoarseness Lungs:  normal respiratory effort, no intercostal retractions, no  accessory muscle use, and normal breath sounds.   Heart:  normal rate, regular rhythm, and no murmur.   Abdomen:  soft and non-tender.  no CVA tenderness   Impression & Recommendations:  Problem # 1:  Laryngitis and ?UTI Udip (-), UCX pending will cover w/ bactrim while Cx is pending historically tessalon pearls help a lot w/  cough: RF see instructions   Problem # 2:  UTI (ICD-599.0) see # 1 The following medications were removed from the medication list:    Doxycycline Hyclate 100 Mg Caps (Doxycycline hyclate) .Marland Kitchen... 1 by  mouth two times a day x 5 days Her updated medication list for this problem includes:    Bactrim Ds 800-160 Mg Tabs (Sulfamethoxazole-trimethoprim) .Marland Kitchen... 1 by mouth two times a day  Problem # 3:  URI (ICD-465.9) see #1 Her updated medication list for this problem includes:    Adult Aspirin Ec Low Strength 81 Mg Tbec (Aspirin) .Marland Kitchen... Take 1 tablet by mouth once a day    Tessalon Perles 100 Mg Caps (Benzonatate) .Marland Kitchen... 1 by mouth as needed cough every 6 hours  Complete Medication List: 1)  Metoprolol Tartrate 50 Mg Tabs (Metoprolol tartrate) .... Take one tablet two times a day 2)  Omeprazole 40 Mg Cpdr (Omeprazole) .... Take two tablets daily before meals 3)  Ativan 0.5 Mg Tabs (Lorazepam) .... Three times a day as needed 4)  Tens Unit  .... Dx 805.8 5)  Ra Vitamin A & D 5000-400 Unit Caps (Vitamins a & d) .... Take one tablet daily 6)  Stool Softener 100 Mg Caps (Docusate sodium) .... Take one tablet daily 7)  Tums Ultra 1000 1000 Mg Chew (Calcium carbonate antacid) .... As needed 8)  Adult Aspirin Ec Low Strength 81 Mg Tbec (Aspirin) .... Take 1 tablet by mouth once a day 9)  Levsin/sl 0.125 Mg Subl (Hyoscyamine sulfate) .... 2 tablets by mouth sublingually four times a day as needed 10)  Cozaar 50 Mg Tabs (Losartan potassium) .Marland Kitchen.. 1 by mouth once daily 11)  Clotrimazole 100 Mg Tabs (Clotrimazole) .... Qid 12)  Tylenol With Codeine #3 300-30 Mg Tabs (Acetaminophen-codeine) .Marland Kitchen.. 1 by mouth every 8 hours as needed migraine ha 13)  Fluorouracil 5 % Crea (Fluorouracil) 14)  Bactrim Ds 800-160 Mg Tabs (Sulfamethoxazole-trimethoprim) .Marland Kitchen.. 1 by mouth two times a day 15)  Tessalon Perles 100 Mg Caps (Benzonatate) .Marland Kitchen.. 1 by mouth as needed cough every 6 hours  Other Orders: Specimen Handling (04540) T-Urine Microscopic ([id number]) T-Culture, Urine ([id number]) UA Dipstick w/o Micro (manual) (81002)  Patient Instructions: 1)  fluids 2)  rest  3)  tylenol 4)  Bactrim x 5 days 5)  for  cough robitussin DM and tessalon lerles as needed  6)  call if no better in few days 7)  call if you get a lot worse  or if you have high fever Prescriptions: TESSALON PERLES 100 MG CAPS (BENZONATATE) 1 by mouth as needed cough every 6 hours  #35 x 0   Entered and Authorized by:   Elita Quick E. Katilyn Miltenberger MD   Signed by:   Nolon Rod. Ridhima Golberg MD on 05/10/2009   Method used:   Electronically to        [facility] #[id number]* (retail)       [address]       [place], [place]  [address]       Ph: [phone]  Fax: [phone]   RxID:   [id number] BACTRIM DS 800-160 MG TABS (SULFAMETHOXAZOLE-TRIMETHOPRIM) 1 by mouth two times a day  #10 x 0   Entered and Authorized by:   Nolon Rod. Luismiguel Lamere MD   Signed by:   Nolon Rod. Thanvi Blincoe MD on 05/10/2009   Method used:   Electronically to        [facility] #[id number]* (retail)       [address]       [place], [place]  [address]       Ph: [phone]       Fax: [phone]   RxID:   [id number]

## 2010-06-15 NOTE — Progress Notes (Signed)
Summary: NO PREP  Phone Note Call from Patient Call back at Home Phone [phone]   Caller: Patient Call For: [name] Reason for Call: Talk to Nurse Details for Reason: no prep  Summary of Call: pt states [facility] [address] did NOT  get her prep Initial call taken by: Guadlupe [facility],  July 08, 2008 4:04 PM  Follow-up for Phone Call        Sent Rx's  to wrong pharmacy when pt was in the office on Monday. Rx was sent to pts pharmacy and pt notified. Follow-up by: Christie Nottingham CMA,  July 08, 2008 4:22 PM      Prescriptions: MOVIPREP 100 GM  SOLR (PEG-KCL-NACL-NASULF-NA ASC-C) As per prep instructions.  #1 x 0   Entered by:   Christie Nottingham CMA   Authorized by:   Meryl Dare MD [facility]   Signed by:   Christie Nottingham CMA on 07/08/2008   Method used:   Electronically to        [facility] [phone]* (retail)       [address]       [place], [place]  [address]       Ph: [phone]       Fax: [phone]   RxID:   [id number] XIFAXAN 200 MG TABS (RIFAXIMIN) 2 tablets by mouth three times a day  #36 x 0   Entered by:   Christie Nottingham CMA   Authorized by:   Meryl Dare MD [facility]   Signed by:   Christie Nottingham CMA on 07/08/2008   Method used:   Electronically to        [facility] [phone]* (retail)       [address]       [place], [place]  [address]       Ph: [phone]       Fax: [phone]   RxID:   [id number] LEVSIN/SL 0.125 MG SUBL (HYOSCYAMINE SULFATE) 2 tablets by mouth sublingually four times a day as needed  #60 x 11   Entered by:   Christie Nottingham CMA   Authorized by:   Meryl Dare MD [facility]   Signed by:   Christie Nottingham CMA on 07/08/2008   Method used:   Electronically to        [facility] #[id number]* (retail)       [address]       [place], [place]  [address]       Ph:  [phone]       Fax: [phone]   RxID:   [id number]

## 2010-06-15 NOTE — Progress Notes (Signed)
Summary: neuro referral request  Phone Note Call from Patient   Caller: Daughter-[name] Durene Cal [phone] Summary of Call: Patient daughter left message on triage that her mother would like to be referred to neurology for frequent HA. Patient was last seen here in June. Does she need office visit 1st or just enter referral to neuro? Please advise. Initial call taken by: Lucious Groves CMA,  March 22, 2010 9:45 AM  Follow-up for Phone Call        please arrange a neurology referral. If headaches severe, needs to let me know, MRI? Follow-up by: Nolon Rod. Raymar Joiner MD,  March 22, 2010 10:47 AM  Additional Follow-up for Phone Call Additional follow up Details #1::        Patient daughter notified, she will discuss severity with patient and call back for appt if severe. Additional Follow-up by: Lucious Groves CMA,  March 22, 2010 11:27 AM

## 2010-06-15 NOTE — Progress Notes (Signed)
Summary: ?botox  Phone Note Call from Patient Call back at Home Phone [phone]   Summary of Call: Patient left message on VM -- What are your thoughts are treating migraine HA with botox?  Referral to neruo? Shary Decamp  July 19, 2008 4:32 PM   Follow-up for Phone Call        depending how/who  arrived  to the conclusion that she needs botox for HAs. If she is not sure about it , a neuro referal is appropiate  Kobyn Kray E. Symantha Steeber MD  July 19, 2008 4:53 PM   Patient has just heard about botx & is interested in it.  She has not seen or talked to any physician about it.  Referral to neuro done .Shary Decamp  July 20, 2008 8:41 AM

## 2010-06-15 NOTE — Letter (Signed)
Summary: Results Follow up Letter  [facility]  [address] [place], [place] [address]   Phone: [phone]  Fax: [phone]    04/16/2008 MRN: [id number]  [name] [address] [place], [place]  [address]  Dear [name],  The following are the results of your recent test(s):  Test         Result    Pap Smear:        Normal _____  Not Normal _____ Comments: ______________________________________________________ Cholesterol: LDL(Bad cholesterol):         Your goal is less than:         HDL (Good cholesterol):       Your goal is more than: Comments:  ______________________________________________________ Mammogram:        Normal _____  Not Normal _____ Comments:  ___________________________________________________________________ Hemoccult:        Normal _____  Not normal _______ Comments:    _____________________________________________________________________ Other Tests:  Attached is a copy of your lab work.  I have faxed a copy to Dr. Aleen Campi.  Your labs were normal except your potassium was a little low.  I have enclosed a list of foods that are high in potassium.  Please call me if you have any questions. Alena Bills

## 2010-06-15 NOTE — Letter (Signed)
[facility]   Imported By: Freddy Jaksch 01/01/2007 11:41:06  _____________________________________________________________________  External Attachment:    Type:   Image     Comment:   External Document
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
## 2010-06-15 NOTE — Progress Notes (Signed)
Summary: pain  Phone Note Outgoing Call   Summary of Call: Patient left message on voice mail -- c/o of LLQ pain x several weeks; hx of diverticulosis -- patient constipated; afebrile advised office visit in am ..Marland KitchenShary Decamp  June 15, 2008 3:47 PM

## 2010-06-15 NOTE — Progress Notes (Signed)
Summary: rx for tylenol  Phone Note Call from Patient   Caller: Patient Summary of Call: patient left msg on voicemail would like prescription for Tylenol #3 called [facility] [phone] Initial call taken by: Doristine Devoid,  March 23, 2009 4:48 PM  Follow-up for Phone Call        Patient requesting rf on tyl #3 1 by mouth every 8 hours as needed migraine HA.  This medication was previously removed from med list -- pt states she needs it again due to HA.  Last ov here 03/08/09.  Dr. Laury Axon - will you rx? Follow-up by: Shary Decamp,  March 25, 2009 10:58 AM  Additional Follow-up for Phone Call Additional follow up Details #1::        Was Dr Drue Novel rx it before?  I couldn't tell from EMR?  If we have not seen her for that we would need to .   Additional Follow-up by: Loreen Freud DO,  March 25, 2009 12:12 PM    Additional Follow-up for Phone Call Additional follow up Details #2::    yes, 3/09 he rx'd #100 Shary Decamp  March 25, 2009 12:58 PM  ok to refill x1 then----  yrlowne 03/25/2009 1p  New/Updated Medications: TYLENOL WITH CODEINE #3 300-30 MG TABS (ACETAMINOPHEN-CODEINE) 1 by mouth every 8 hours as needed migraine HA  Prescriptions: TYLENOL WITH CODEINE #3 300-30 MG TABS (ACETAMINOPHEN-CODEINE) 1 by mouth every 8 hours as needed migraine HA  #100 x 0   Entered by:   Shary Decamp   Authorized by:   Loreen Freud DO   Signed by:   Shary Decamp on 03/25/2009   Method used:   Printed then faxed to ...       [facility] #[id number]* (retail)       [address]       [place], [place]  [address]       Ph: [phone]       Fax: [phone]   RxID:   [id number]

## 2010-06-15 NOTE — Progress Notes (Signed)
Summary: cll from daughter- dr Drue Novel  Phone Note Call from Patient Call back at [phone] - cell   Caller: Daughter Summary of Call: patient daughter celia called wanting to talk to dr Drue Novel ---explained he called & no answer  Initial call taken by: Okey Regal [place],  September 11, 2007 10:42 AM  Follow-up for Phone Call        I actually talked to her, see previous note. If there is a specific issue she needs to tell me about,  tell her either stacia or me call tomorrow...Marland KitchenMarland KitchenJose E. Okley Magnussen MD  September 11, 2007 1:10 PM  daughter very concerned about her mother and would like to speak with dr Lorenza Cambridge  Sep 12, 2007 11:14 AM  spoke with patient's daughter today.  She is soon to be discharged to [facility].  She is doing better............Sherrice Creekmore E. Declan Mier MD  Sep 15, 2007 12:36 PM

## 2010-06-15 NOTE — Progress Notes (Signed)
Summary: calling to check on pt  ---- Converted from flag ---- ---- 10/07/2009 2:46 PM, Shirly Bartosiewicz E. Tishana Clinkenbeard MD wrote: please check on the patient ------------------------------ pt states that she is feeling better.  advised to call with any increased sxs or concerns Shary Decamp  Oct 11, 2009 2:11 PM

## 2010-06-15 NOTE — Letter (Signed)
Summary: Primary Care Consult Scheduled Letter  [facility]  [address] [place], [place] [address]   Phone: [phone]  Fax: [phone]      07/29/2008 MRN: [id number]  [name] [address] [place], [place]  [address]    Dear [name],      We have scheduled an appointment for you.  At the recommendation of Dr. Wanda Plump, we have scheduled you a consult with Dr. Anne Hahn at [facility] on 09-03-08 at 10:00am.  Their address is [address] [place] [address]. The office phone number is [phone].  If this appointment day and time is not convenient for you, please feel free to call the office of the doctor you are being referred to at the number listed above and reschedule the appointment.     It is important for you to keep your scheduled appointments. We are here to make sure you are given good patient care. If you have questions or you have made changes to your appointment, please notify us at  [phone], ask for Renee.    Thank you,  Patient Care Coordinator [place] [facility]

## 2010-06-15 NOTE — Assessment & Plan Note (Signed)
Summary: SINCE 12-24-08 FEVER,CHILLS,INCONTINENCE/RH......Marland Kitchen   Vital Signs:  Patient profile:   77 year old female Temp:     98.5 degrees F Pulse rate:   84 / minute Resp:     18 per minute BP sitting:   126 / 68  (left arm) Cuff size:   regular  Vitals Entered By: Shonna Chock (December 27, 2008 12:13 PM) CC: FEVER(OFF/ON), CHILLS,SHAKEY SINCE SATURDAY NIGHT. INCONTIENCE Comments REVIEWED MED LIST, PATIENT AGREED DOSE AND INSTRUCTION CORRECT    Primary Care Provider:  Willow Ora, MD  CC:  FEVER(OFF/ON), CHILLS, and SHAKEY SINCE SATURDAY NIGHT. INCONTIENCE.  History of Present Illness: Onset 8/10 as chills & fever; intermittent fever for 4 days to 102.4. Homero Fellers rigor Sun am 8/15. Temp to 101.4 that day. Rx: ASA. Incontinence  as of 8/13 when up, but iIncontinence improved today. PMH of CM; on Cozaar & Beta  blocker.  Allergies: 1)  ! Pcn 2)  ! Asa 3)  ! Accupril 4)  ! * Toprol 5)  ! Lescol 6)  ! Zocor 7)  ! Lipitor 8)  ! * Zetia 9)  ! * Crestor 10)  ! Milk of Magnesia (Magnesium Hydroxide) 11)  ! Diovan  Past History:  Past Medical History: Reviewed history from 08/16/2008 and no changes required. Headache - migraines Hypertension varicose veins Osteoporosis Hyperlipidemia Diverticulosis, colon GERD  (HH) Coronary artery disease Cerebrovascular accident, hx of h/o R leg DVT after a venous ablation 4-09  L2 fracture status post vertebroplasty of L2 September 10, 2007 performed  by IR 4-09 Depression/anxiety -lost husband  Obstructive sleep apnea Hiatal hernia Hemorrhoids Hyperplastic colon polyp, 06/2001 Functional LLQ pain Osteoarthritis  Past Surgical History: Appendectomy left ovarian cyst 3/07 - stress test neg Tonsillectomy Back Surgery Tubal Ligation Dental extraction  L maxillary molar; Kyphoplasty 08/2007  Family History: colon polyps--cousin colon ca-- cousin Family History of Breast Cancer: Mother, Sister Family History of Heart Disease, MI s:  Father Mother Multiple Myeloma  Social History: Married, lost husband 07-29-07 lives by self driving  Former Smoker: quit 1990 Alcohol use-yes children x 3 ( 2 in [place] , see them occasionaly) Daily Caffeine Use coffee  Review of Systems General:  See HPI; Complains of fatigue, loss of appetite, and weight loss; 10#  loss over past 6 days. Eyes:  Denies discharge, eye pain, red eye, and vision loss-both eyes. ENT:  Denies nasal congestion and sinus pressure; No purulence, frontal headache, or  facial pain. CV:  Complains of shortness of breath with exertion; denies bluish discoloration of lips or nails, difficulty breathing at night, swelling of feet, and swelling of hands. Resp:  Complains of shortness of breath; denies chest pain with inspiration, cough, coughing up blood, sputum productive, and wheezing. GI:  Denies constipation and diarrhea. GU:  Complains of urinary frequency; denies discharge, dysuria, and hematuria. MS:  Denies joint redness and joint swelling. Derm:  Denies lesion(s) and rash. Neuro:  Denies headaches. Psych:  depression since death of husband. Endo:  Complains of polyuria; denies excessive hunger and excessive thirst. Heme:  Denies abnormal bruising and bleeding.  Physical Exam  General:  Clinically profoundly weak but in no acute distress;  cooperative throughout examination.  Head:  Normocephalic and atraumatic without obvious abnormalities.  Eyes:  No corneal or conjunctival inflammation noted. Perrla.No icterus Ears:  External ear exam shows no significant lesions or deformities.  Otoscopic examination reveals clear canals, tympanic membranes are intact bilaterally without bulging, retraction, inflammation or discharge. Hearing is grossly normal  bilaterally. Nose:  External nasal examination shows no deformity or inflammation. Nasal mucosa are pink and moist without lesions or exudates. Mouth:  Oral mucosa and oropharynx without lesions or exudates.  Teeth  in good repair; op site L maxillary area  w/o pus.Severely dry phrynx ;pharyngeal erythema.   Neck:  No deformities, masses, or tenderness noted. Lungs:  SOB @ rest, chest expands symmetrically. Lungs are clear to auscultation, no crackles or wheezes.O2 sats 96% on RA Heart:  Normal rate and regular rhythm. S1 and S2 normal without  murmur, click, rub. S4 gallop. P 95 Abdomen:  Bowel sounds positive,abdomen soft and non-tender without masses, organomegaly or hernias noted. Rectal:  deferred Genitalia:  deferred Pulses:  R and L carotid,radial and posterior tibial pulses are full and equal bilaterally. PTP decreased Extremities:  No clubbing, cyanosis, edema Neurologic:   oriented X3 but judgement questionable in regards to delay in getting medical care   Skin:  Intact without suspicious lesions or rashes. Tenting; no jaundice Cervical Nodes:  No lymphadenopathy noted Axillary Nodes:  No palpable lymphadenopathy Psych:  flat affect and subdued. (See Neuro comments)     Impression & Recommendations:  Problem # 1:  FEVER (ICD-780.60)  Problem # 2:  FATIGUE (ICD-780.79)  Problem # 3:  DYSPNEA/SHORTNESS OF BREATH (ICD-786.09)  Her updated medication list for this problem includes:    Metoprolol Tartrate 50 Mg Tabs (Metoprolol tartrate) .Marland Kitchen... Take one tablet two times a day  Problem # 4:  ISCHEMIC CARDIOMYOPATHY (ICD-414.8)  Her updated medication list for this problem includes:    Metoprolol Tartrate 50 Mg Tabs (Metoprolol tartrate) .Marland Kitchen... Take one tablet two times a day    Adult Aspirin Ec Low Strength 81 Mg Tbec (Aspirin) .Marland Kitchen... Take 1 tablet by mouth once a day    Cozaar 50 Mg Tabs (Losartan potassium) .Marland Kitchen... 1 by mouth once daily  Problem # 5:  WEIGHT LOSS (ICD-783.21)  Complete Medication List: 1)  Metoprolol Tartrate 50 Mg Tabs (Metoprolol tartrate) .... Take one tablet two times a day 2)  Omeprazole 40 Mg Cpdr (Omeprazole) .... Take two tablets daily before meals 3)  Ativan 0.5  Mg Tabs (Lorazepam) .... Three times a day as needed 4)  Tens Unit  .... Dx 805.8 5)  Ra Vitamin A & D 5000-400 Unit Caps (Vitamins a & d) .... Take one tablet daily 6)  Stool Softener 100 Mg Caps (Docusate sodium) .... Take one tablet daily 7)  Tums Ultra 1000 1000 Mg Chew (Calcium carbonate antacid) .... As needed 8)  Adult Aspirin Ec Low Strength 81 Mg Tbec (Aspirin) .... Take 1 tablet by mouth once a day 9)  Levsin/sl 0.125 Mg Subl (Hyoscyamine sulfate) .... 2 tablets by mouth sublingually four times a day as needed 10)  Cozaar 50 Mg Tabs (Losartan potassium) .Marland Kitchen.. 1 by mouth once daily  Patient Instructions: 1)  See Orders; Admit to Telemetry for Observation

## 2010-06-15 NOTE — Miscellaneous (Signed)
Summary: Orders Update  Clinical Lists Changes  Problems: Added new problem of CHEST PAIN (ICD-786.50) Orders: Added new Referral order of Cardiology Referral (Cardiology) - Signed

## 2010-06-15 NOTE — Progress Notes (Signed)
Summary: SOONER APPT  Phone Note Call from Patient Call back at [facility] Phone [phone]   Caller: Patient Summary of Call: PT SAID SHE NEEDS A APPT WITH D. PAZ. SHE SAID SHE HAS A LOT OF PROBLEMS AND SHE DON'T WANT TO SEE ANOTHER DOCTOR. WANT TO KNOW IF SHE CAN GET A SOONER APPT WITH DR.PAZ,HER MAIN CONCERN IS SHE IS HAVING STOMACH PAIN. AND SHE DON'T THINK SHE CAN WAIT A FEW WEEKS TO SEE HIM.   TAKEN BY VANESSA Initial call taken by: Okey Regal [place],  December 25, 2006 11:29 AM  Follow-up for Phone Call        stacia all we have is sda and nda  can we use one of these for this pt? Follow-up by: Kandice Hams,  December 25, 2006 12:32 PM  Additional Follow-up for Phone Call Additional follow up Details #1::        what kind of problem is she having??  chronic or acute?  I ..................................................................Marland KitchenShary Decamp  December 25, 2006 12:47 PM     Additional Follow-up for Phone Call Additional follow up Details #2::    Shes having chronic not acute ie;3-4 migraines per week stomach pain below belly button off and on next roa is way out there like sometime in sept Follow-up by: Kandice Hams,  December 25, 2006 2:43 PM  Additional Follow-up for Phone Call Additional follow up Details #3:: Details for Additional Follow-up Action Taken: the only thing I can suggest is monday 8/18 @ 3:30pm; she may have to wait; if sxs increase or worse needs to go to er ..................................................................Marland KitchenShary Decamp  December 25, 2006 4:15 PM SPOKE WITTH PT SHE IS AWARE----------- Additional Follow-up by: Kandice Hams,  December 25, 2006 4:38 PM

## 2010-06-15 NOTE — Letter (Signed)
Summary: considereing knee replacement ----  Orthopaedic   [facility]   Imported By: Lanelle Bal 03/16/2010 15:19:29  _____________________________________________________________________  External Attachment:    Type:   Image     Comment:   External Document

## 2010-06-15 NOTE — Assessment & Plan Note (Signed)
Summary: ACUTE- STOMACH PAINS//VGJ   Vital Signs:  Patient Profile:   77 Years Old Female Height:     66.5 inches Weight:      173 pounds Temp:     98.2 degrees F oral BP sitting:   130 / 80  (left arm)  Vitals Entered By: Doristine Devoid (February 11, 2008 11:10 AM)                 Chief Complaint:  stomach bloating and pain x3 weeks no NVD pain mainly in LLQ.  History of Present Illness: stomach bloating (mid abdomen) and LLQ  pain x3 weeks  symptoms remind her of diverticulitis no new meds except Diovan denies OTC NSAIDs (does use AlkaZeltser)    Current Allergies: ! PCN ! ASA ! ACCUPRIL ! * TOPROL ! LESCOL ! ZOCOR ! LIPITOR ! * ZETIA ! * CRESTOR ! MILK OF MAGNESIA (MAGNESIUM HYDROXIDE)  Past Medical History:    Reviewed history from 10/08/2007 and no changes required:       Headache - migraines       Hypertension       varicose veins       Osteoporosis       Hyperlipidemia       Diverticulosis, colon       GERD  (HH)       Coronary artery disease       Cerebrovascular accident, hx of       h/o R leg DVT after a venous ablation       4-09  L2 fracture status post vertebroplasty of L2 September 10, 2007 performed             by interventional radiology.        4-09 Depression/anxiety -lost husband   Past Surgical History:    Reviewed history from 06/25/2007 and no changes required:       Appendectomy       left l ovarian cyst       3/07 - stress test neg   Social History:    Married, lost husband 07-29-07    lives by self     driving     Former Smoker    Alcohol use-yes    children x 3     Review of Systems  General      Denies fever.      sweats at night x 1 + heartburn sometimes, on omeprazole   GI      Denies bloody stools, diarrhea, nausea, and vomiting.      good by mouth tolerance  GU      Denies dysuria and hematuria.   Physical Exam  General:     alert and well-developed.   Eyes:     not pale  Lungs:     normal  respiratory effort, no intercostal retractions, no accessory muscle use, and normal breath sounds.   Heart:     normal rate, regular rhythm, and no murmur.   Abdomen:     soft, non-tender, normal bowel sounds, no distention, no masses, no guarding, and no rigidity.      Impression & Recommendations:  Problem # 1:  DYSPEPSIA (ICD-536.8) Assessment: Deteriorated CHART REVIEWED see note from 8-08 abdomen exam is benign Udip neg plan d/c AlkaZetser cont. w. omeprazole two times a day, before meals add levsin call if worse  Problem # 2:  HYPERTENSION (ICD-401.9) on diovan, new med, per cards Her updated medication list for this problem includes:  Diovan 160 Mg Tabs (Valsartan) .Marland Kitchen... Take one tablet daily    Metoprolol Tartrate 50 Mg Tabs (Metoprolol tartrate) .Marland Kitchen... Take one tablet two times a day   Problem # 3:  Med list updated base on pt report and her list  Problem # 4:  HEADACHE (ICD-784.0) has found that MOM caused HAs Her updated medication list for this problem includes:    Metoprolol Tartrate 50 Mg Tabs (Metoprolol tartrate) .Marland Kitchen... Take one tablet two times a day    Hydrocodone-acetaminophen 5-325 Mg Tabs (Hydrocodone-acetaminophen) .Marland Kitchen... 2 by mouth every 4 hours prn    Fioricet 50-325-40 Mg Tabs (Butalbital-apap-caffeine) .Marland Kitchen... 1 by mouth qid as needed headache   Problem # 5:  ANXIETY DEPRESSION (ICD-300.4) states she is emotionally ok  Problem # 6:  face-to-face with the patient more than 25 minutes due to chart review  Complete Medication List: 1)  Diovan 160 Mg Tabs (Valsartan) .... Take one tablet daily 2)  Metoprolol Tartrate 50 Mg Tabs (Metoprolol tartrate) .... Take one tablet two times a day 3)  Omeprazole 40 Mg Cpdr (Omeprazole) .... Take two tablets daily before meals 4)  Ativan 0.5 Mg Tabs (Lorazepam) .... Three times a day as needed 5)  Hydrocodone-acetaminophen 5-325 Mg Tabs (Hydrocodone-acetaminophen) .... 2 by mouth every 4 hours prn 6)  Tens Unit   .... Dx 805.8 7)  Fioricet 50-325-40 Mg Tabs (Butalbital-apap-caffeine) .Marland Kitchen.. 1 by mouth qid as needed headache 8)  Ra Vitamin A & D 5000-400 Unit Caps (Vitamins a & d) .... Take one tablet daily 9)  Stool Softener 100 Mg Caps (Docusate sodium) .... Take one tablet daily 10)  Levsin 0.125 Mg Tabs (Hyoscyamine sulfate) .Marland Kitchen.. 1 opr 2 by mouth qid as needed abdominal pain  Other Orders: UA Dipstick w/o Micro (manual) ([id number])   Patient Instructions: 1)  call if no better in 1 or 2 weeks or if symptoms severe    Prescriptions: LEVSIN 0.125 MG TABS (HYOSCYAMINE SULFATE) 1 opr 2 by mouth qid as needed abdominal pain  #40 x 0   Entered and Authorized by:   Nolon Rod. Joab Carden MD   Signed by:   Nolon Rod. Katharin Schneider MD on 02/11/2008   Method used:   Print then Give to Patient   RxID:   [id number]  ] Laboratory Results   Urine Tests    Routine Urinalysis   Glucose: negative   (Normal Range: Negative) Bilirubin: negative   (Normal Range: Negative) Ketone: negative   (Normal Range: Negative) Spec. Gravity: <1.005   (Normal Range: 1.003-1.035) Blood: negative   (Normal Range: Negative) pH: 7.0   (Normal Range: 5.0-8.0) Protein: negative   (Normal Range: Negative) Urobilinogen: 0.2   (Normal Range: 0-1) Nitrite: negative   (Normal Range: Negative) Leukocyte Esterace: negative   (Normal Range: Negative)

## 2010-06-15 NOTE — Miscellaneous (Signed)
[facility]   Imported By: Freddy Jaksch 11/18/2007 11:57:32  _____________________________________________________________________  External Attachment:    Type:   Image     Comment:   External Document

## 2010-06-15 NOTE — Miscellaneous (Signed)
[facility]   Imported By: Freddy Jaksch 10/21/2007 10:27:43  _____________________________________________________________________  External Attachment:    Type:   Image     Comment:   External Document

## 2010-06-15 NOTE — Assessment & Plan Note (Signed)
Summary: abd pain/swh   Vital Signs:  Patient Profile:   77 Years Old Female Weight:      178 pounds Temp:     97.8 degrees F BP sitting:   120 / 80  (left arm) Cuff size:   regular  Vitals Entered By: Shary Decamp (December 30, 2006 4:20 PM)               Chief Complaint:  +HA; LLQ pain, bloating, +indigestion, and +loose stools x 3 months.  History of Present Illness: three month history of abdominal pain. The pain he is on and off of the left lower quadrant she also feels bloated/swollen at the epigastric area. Had two episodes of loose stools.  Also has a history of migraines and two weeks ago she had for migraines in a single week which is slightly unusual.  the headache itself feels like her regular migraine including the aura, she takes Tylenol No. 3 and that works well for her .  Current Allergies (reviewed today): ! PCN ! ASA ! ACCUPRIL ! * TOPROL ! LESCOL ! ZOCOR ! LIPITOR ! * ZETIA ! * CRESTOR Updated/Current Medications (including changes made in today's visit):  PRILOSEC 20 MG  CPDR (OMEPRAZOLE) two p.o. everyday in the morning METOPROLOL TARTRATE 25 MG  TABS (METOPROLOL TARTRATE) 1/2 by mouth bid ASPIR-LOW 81 MG  TBEC (ASPIRIN)  * VITAMIN E  * B6 -- B12  * VITAMIN D  LEVSIN 0.125 MG  TABS (HYOSCYAMINE SULFATE) one or two p.o. four times a day and p.r.n. dyspepsia   Past Medical History:    Reviewed history from 09/30/2006 and no changes required:       Headache - migraines       Hypertension       varicose veins       Osteoporosis     Physical Exam  General:     alert, well-developed, and well-nourished.   Lungs:     normal respiratory effort, no intercostal retractions, and no accessory muscle use.   Heart:     normal rate, regular rhythm, and no murmur.   Abdomen:     soft, non-tender, no hepatomegaly, and no splenomegaly.   Extremities:     no edema Neurologic:     alert & oriented X3.     Review of Systems       denies fever,  weight loss.  Appetite is excellent. Denies nausea or vomiting.    Impression & Recommendations:  Problem # 1:  DYSPEPSIA (ICD-536.8) chart review. Back into the 2005 she was seen here with left lower abdominal pain, CT of the abdomen was negative, she was seen by Dr. Russella Dar. She had a colonoscopy in2003 which showed polyps, diverticulosis and hemorrhoids. In the past she has been treated for these episodes of dyspepsia with Robinul. On December 2006 she was also eval. by  another GI doctor whoordered an  upper GI that show basically a dysmotility disorder. Will recommend increase omeprazole to two tablets a day and start Levsin.  Problem # 2:  HEADACHE (ICD-784.0) stable Her updated medication list for this problem includes:    Metoprolol Tartrate 25 Mg Tabs (Metoprolol tartrate) .Marland Kitchen... 1/2 by mouth bid    Aspir-low 81 Mg Tbec (Aspirin)  Orders: Neurology Referral (Neuro)   Problem # 3:  SYNCOPE, HX OF (ICD-V12.49) before she left, she said that four weeks ago she passed out. she was at home, she suddenly passed out while she was walking to the  restroom, she was unconscious for maybe two or three seconds. there was no bowel or bowel incontinence, no seizure activity, no tongue bitting, dizziness, chest pain, double vision, slurred speech or chest pain. On today's visit she is alert oriented in no apparent distress.  Speech gait and motor are intact. I'm concerned about these episodes, neurology referral. Orders: Neurology Referral (Neuro)   Complete Medication List: 1)  Prilosec 20 Mg Cpdr (Omeprazole) .... Two p.o. everyday in the morning 2)  Metoprolol Tartrate 25 Mg Tabs (Metoprolol tartrate) .... 1/2 by mouth bid 3)  Aspir-low 81 Mg Tbec (Aspirin) 4)  Vitamin E  5)  B6 -- B12  6)  Vitamin D  7)  Levsin 0.125 Mg Tabs (Hyoscyamine sulfate) .... One or two p.o. four times a day and p.r.n. dyspepsia  Other Orders: UA Dipstick w/o Micro ([id number])   Patient Instructions: 1)   increase the dose of omeprazole. 2)  Use levsin  as needed  for pain. 3)  Please come back in four weeks and if you are not better, will consider sending you back to GI. 4)  I am referring you to the neurologist. If you have any more spells, let us know immediately.    Prescriptions: LEVSIN 0.125 MG  TABS (HYOSCYAMINE SULFATE) one or two p.o. four times a day and p.r.n. dyspepsia  #60 x 0   Entered and Authorized by:   Nolon Rod. Baani Bober MD   Signed by:   Nolon Rod. Tameko Halder MD on 12/30/2006   Method used:   Print then Give to Patient   RxID:   [id number] PRILOSEC 20 MG  CPDR (OMEPRAZOLE) two p.o. everyday in the morning  #60 x 3   Entered and Authorized by:   Nolon Rod. Dorine Duffey MD   Signed by:   Nolon Rod. Bain Whichard MD on 12/30/2006   Method used:   Print then Give to Patient   RxID:   [id number]   Laboratory Results   Urine Tests   Date/Time Reported: December 30, 2006 5:05 PM   Routine Urinalysis   Color: yellow Appearance: Clear Glucose: negative   (Normal Range: Negative) Bilirubin: negative   (Normal Range: Negative) Ketone: negative   (Normal Range: Negative) Spec. Gravity: 1.010   (Normal Range: 1.003-1.035) Blood: negative   (Normal Range: Negative) pH: 5.0   (Normal Range: 5.0-8.0) Protein: negative   (Normal Range: Negative) Urobilinogen: negative   (Normal Range: 0-1) Nitrite: negative   (Normal Range: Negative) Leukocyte Esterace: negative   (Normal Range: Negative)

## 2010-06-15 NOTE — Progress Notes (Signed)
----   Converted from flag ---- ---- 06/26/2007 6:13 AM, Stacia Hawks wrote: discussed with pt  ---- 06/26/2007 6:13 AM, Jose E. Paz MD wrote: please see my a/p on syncope, tellher I saw neuro notes and MRI: needs to see them,MRI no bleeding (she thought they said bleeding) ------------------------------ 

## 2010-06-15 NOTE — Progress Notes (Signed)
----   Converted from flag ---- ---- 06/27/2007 10:45 AM, Shary Decamp wrote:   ---- 06/26/2007 6:13 AM, Shary Decamp wrote: discussed with pt  ---- 06/26/2007 6:13 AM, Nolon Rod. Paz MD wrote: please see my a/p on syncope, tellher I saw neuro notes and MRI: needs to see them,MRI no bleeding (she thought they said bleeding) ------------------------------

## 2010-06-15 NOTE — Assessment & Plan Note (Signed)
Summary: HOSP F/UP--OK PER STAC[facility]///[facility]   Vital Signs:  Patient Profile:   77 Years Old Female Height:     66.5 inches Weight:      172.8 pounds Pulse rate:   86 / minute BP sitting:   142 / 78  Vitals Entered By: Shary Decamp (Oct 08, 2007 3:53 PM)                 Chief Complaint:  hosp f/u.  History of Present Illness:  DATE OF ADMISSION:  09/07/2007   DATE OF DISCHARGE:  09/15/2007    1. Acute on chronic low back pain status post MRI of acute/subacute L2       fracture status post vertebroplasty of L2 September 10, 2007 performed       by interventional radiology.   2. Shortness of breath on admission likely multifactorial in setting       of pain, anxiety, resolved.   3. Depression/anxiety started on SSRI during this admission.  Plan to       continue p.r.n. Ativan.       Updated Prior Medication List: PROTONIX 40 MG  TBEC (PANTOPRAZOLE SODIUM) 1 by mouth qd METOPROLOL TARTRATE 25 MG  TABS (METOPROLOL TARTRATE) 1y mouth two times a day ATIVAN 0.5 MG  TABS (LORAZEPAM) three times a day as needed HYDROCODONE-ACETAMINOPHEN 5-325 MG  TABS (HYDROCODONE-ACETAMINOPHEN) 2 by mouth every 4 hours prn  Current Allergies (reviewed today): ! PCN ! ASA ! ACCUPRIL ! * TOPROL ! LESCOL ! ZOCOR ! LIPITOR ! * ZETIA ! * CRESTOR  Past Medical History:    Headache - migraines    Hypertension    varicose veins    Osteoporosis    Hyperlipidemia    Diverticulosis, colon    GERD  (HH)    Coronary artery disease    Cerebrovascular accident, hx of    h/o R leg DVT after a venous ablation    4-09  L2 fracture status post vertebroplasty of L2 September 10, 2007 performed          by interventional radiology.     4-09 Depression/anxiety -lost husband    Social History:    Married, lost husband 07-29-07    lives by self    not driving at present (8-11)    Former Smoker    Alcohol use-yes    children x 3     Review of Systems       since she left the  hospital: breathing--better pain--mostly gone at this time was Rx TENs for out pt use, has not started to have home PT family supportive ADL-independent edema--no  Psych      mood ok, on as needed ativan not on SSRIs   Physical Exam  General:     alert, well-developed, and well-nourished.   Lungs:     normal respiratory effort, no intercostal retractions, no accessory muscle use, and normal breath sounds.   Heart:     normal rate, regular rhythm, and no murmur.   Extremities:     no edema at LE Neurologic:     alert & oriented X3, strength normal in all extremities, and gait normal.   Psych:     memory intact for recent and remote, normally interactive, good eye contact, not anxious appearing, slt depress appearing    Impression & Recommendations:  Problem # 1:  VERTEBRAL FRACTURE (ICD-805.8) s/p vertebroplasty to get home PT better will need a DEXA on RTC (if not done within 2  years)  Problem # 2:  ANXIETY DEPRESSION (ICD-300.4) recently lost her husband was on SSRI while at hospital currently on as needed benzos only  and doing better  Problem # 3:  SOB--resolved  Problem # 4:  F2F > 25 min due to chart review  Complete Medication List: 1)  Protonix 40 Mg Tbec (Pantoprazole sodium) .Marland Kitchen.. 1 by mouth qd 2)  Metoprolol Tartrate 25 Mg Tabs (Metoprolol tartrate) .Marland Kitchen.. 1y mouth two times a day 3)  Ativan 0.5 Mg Tabs (Lorazepam) .... Three times a day as needed 4)  Hydrocodone-acetaminophen 5-325 Mg Tabs (Hydrocodone-acetaminophen) .... 2 by mouth every 4 hours prn 5)  Tens Unit  .... Dx 805.8   Patient Instructions: 1)  Please schedule a follow-up appointment in 3 months.   Prescriptions: TENS UNIT dx 805.8  #1 x 0   Entered by:   Shary Decamp   Authorized by:   Nolon Rod. Omran Keelin MD   Signed by:   Shary Decamp on 10/08/2007   Method used:   Print then Give to Patient   RxID:   [id number]  ]

## 2010-06-15 NOTE — Progress Notes (Signed)
----   Converted from flag ---- ---- 06/26/2007 6:13 AM, Shary Decamp wrote: discussed with pt  ---- 06/26/2007 6:13 AM, Nolon Rod. Paz MD wrote: please see my a/p on syncope, tellher I saw neuro notes and MRI: needs to see them,MRI no bleeding (she thought they said bleeding) ------------------------------

## 2010-06-15 NOTE — Procedures (Signed)
Summary: Colonoscopy    Colonoscopy  Procedure date:  07/07/2001  Findings:      Results: Polyp.  Results: Hemorrhoids.     Results: Diverticulosis.       Location:  [facility].    Procedures Next Due Date:    Colonoscopy: 07/2011  Patient Name: [name], [name]. MRN:  Procedure Procedures: Colonoscopy CPT: [phone].    with Hot Biopsy(s)CPT: [id number].  Personnel: Endoscopist: Venita Lick. Russella Dar, MD, Clementeen Graham.  Exam Location: Exam performed in Outpatient Clinic. Outpatient  Patient Consent: Procedure, Alternatives, Risks and Benefits discussed, consent obtained, from patient. Consent was obtained by the RN.  Indications Symptoms: Abdominal pain / bloating.  History  Pre-Exam Physical: Performed Jul 07, 2001. Entire physical exam was normal.  Exam Exam: Extent of exam reached: Cecum, extent intended: Cecum.  The cecum was identified by appendiceal orifice and IC valve. Colon retroflexion performed. ASA Classification: II. Tolerance: good.  Monitoring: Pulse and BP monitoring, Oximetry used. Supplemental O2 given.  Colon Prep Used Golytely for colon prep. Prep results: good.  Sedation Meds: Patient assessed and found to be appropriate for moderate (conscious) sedation. Fentanyl 100 mcg. given IV. Versed 7 mg. given IV.  Findings - DIVERTICULOSIS: Sigmoid Colon. Not bleeding. ICD9: Diverticulosis: 562.10. Comments: Mild.  NORMAL EXAM: Cecum to Descending Colon.  POLYP: Rectum, Maximum size: 5 mm. sessile polyp. Procedure:  hot biopsy, removed, retrieved, Polyp sent to pathology. ICD9: Colon Polyps: 211.3.  HEMORRHOIDS: Internal. Size: Medium. Not bleeding. Not thrombosed. ICD9: Hemorrhoids, Internal: 455.0.   Assessment  Diagnoses: 211.3: Colon Polyps.  455.0: Hemorrhoids, Internal.  562.10: Diverticulosis.   Events  Unplanned Interventions: No intervention was required.  Unplanned Events: There were no complications. Plans  Post Exam  Instructions: No aspirin or non-steroidal containing medications: 2 weeks.  Medication Plan: Await pathology. Continue current medications. other: Robinul forte BID,   Patient Education: Patient given standard instructions for: Polyps. Diverticulosis. Hemorrhoids.  Disposition: After procedure patient sent to recovery. After recovery patient sent home.  Scheduling/Referral: Colonoscopy, to [facility] T. Russella Dar, MD, [facility], if polyp is adenomatous, around Jul 07, 2006.  Primary Care Provider, to M. Nicanor Bake, MD,    This report was created from the original endoscopy report, which was reviewed and signed by the above listed endoscopist.    cc: M. Nicanor Bake, MD

## 2010-06-15 NOTE — Assessment & Plan Note (Signed)
Summary: 4 mth fu/ns/kdc   Vital Signs:  Patient profile:   77 year old female Height:      66.5 inches Weight:      173.6 pounds BMI:     27.70 Pulse rate:   60 / minute BP sitting:   142 / 80  Vitals Entered By: Shary Decamp (July 11, 2009 3:19 PM) CC: rov - fyi Dr. Aleen Campi will be retiring 08/2009, will need new cardiologist   History of Present Illness: CAD--- was seen here w/ CP, s/p eval by cards, they did not suggested any further test  Dr. Aleen Campi will be retiring 08/2009, will need new cardiologist  Hypertension-- no ambulatory BPs, BP good at cards office    Osteoporosis-- we discussed her dexa from April 2010 .  She has never taken medication for osteoporosis, does not have any trouble swallowing   varicose veins  -- has a place in the L leg  for 4 weeks, no bleeding, some pain   OA-- knee pain ,getting  local shots by ortho        Current Medications (verified): 1)  Metoprolol Tartrate 50 Mg Tabs (Metoprolol Tartrate) .... Take One Tablet Two Times A Day 2)  Omeprazole 40 Mg Cpdr (Omeprazole) .... Take Two Tablets Daily Before Meals 3)  Ativan 0.5 Mg  Tabs (Lorazepam) .... Three Times A Day As Needed 4)  Tens Unit .... Dx 805.8 5)  Ra Vitamin A & D 5000-400 Unit Caps (Vitamins A & D) .... Take One Tablet Daily 6)  Stool Softener 100 Mg Caps (Docusate Sodium) .... Take One Tablet Daily 7)  Tums Ultra 1000 1000 Mg Chew (Calcium Carbonate Antacid) .... As Needed 8)  Adult Aspirin Ec Low Strength 81 Mg Tbec (Aspirin) .... Take 1 Tablet By Mouth Once A Day 9)  Levsin/sl 0.125 Mg Subl (Hyoscyamine Sulfate) .... 2 Tablets By Mouth Sublingually Four Times A Day As Needed 10)  Cozaar 50 Mg Tabs (Losartan Potassium) .Marland Kitchen.. 1 By Mouth Once Daily 11)  Clotrimazole 100 Mg Tabs (Clotrimazole) .... Qid  Allergies (verified): 1)  ! Pcn 2)  ! Asa 3)  ! Accupril 4)  ! * Toprol 5)  ! Lescol 6)  ! Zocor 7)  ! Lipitor 8)  ! * Zetia 9)  ! * Crestor 10)  ! Milk of  Magnesia (Magnesium Hydroxide) 11)  ! Diovan  Past History:  Past Medical History: Headache - migraines Hypertension Hyperlipidemia Osteoporosis Coronary artery disease h/o CVA GI:  GERD Hiatal hernia, Hemorrhoids, Hyperplastic colon polyp, 06/2001, Functional LLQ pain, h/o diverticuli Obstructive sleep apnea varicose veins h/o R leg DVT after a venous ablation OA 4-09  L2 fracture status post vertebroplasty of L2 September 10, 2007 performed  by IR 4-09 Depression/anxiety -lost husband   Social History: Reviewed history from 12/27/2008 and no changes required. Married, lost husband 07-29-07 lives by self driving  Former Smoker: quit 1990 Alcohol use-yes children x 3 ( 2 in [place] , see them occasionaly) Daily Caffeine Use coffee  Review of Systems       no further chest pain since the last office visit no shortness or breath or edema no nausea, vomiting, diarrhea or blood in the stool needs a refill on lorazepam  Physical Exam  General:  alert and well-developed.   Lungs:  normal respiratory effort, no intercostal retractions, no accessory muscle use, and normal breath sounds.   Heart:  normal rate, regular rhythm, and no murmur.   Extremities:  note lower  extremity edema at the  left medial aspect of the tibia she has a 3 mm, slightly hard varicose vein.  No signs of bleeding   Impression & Recommendations:  Problem # 1:  ANXIETY DEPRESSION (ICD-300.4) refill Ativan  Problem # 2:  CORONARY ARTERY DISEASE (ICD-414.00) refer  to a new cardiologist Her updated medication list for this problem includes:    Metoprolol Tartrate 50 Mg Tabs (Metoprolol tartrate) .Marland Kitchen... Take one tablet two times a day    Adult Aspirin Ec Low Strength 81 Mg Tbec (Aspirin) .Marland Kitchen... Take 1 tablet by mouth once a day    Cozaar 50 Mg Tabs (Losartan potassium) .Marland Kitchen... 1 by mouth once daily  Orders: Cardiology Referral (Cardiology)  Problem # 3:  OSTEOPOROSIS (ICD-733.00) we discussed her dexa from  April 2010 .  She has never taken medication for osteoporosis, does not have any trouble swallowing,  because medicine cost is a major issue, we will start her on Fosamax precautions discussed  Orders: TLB-TSH (Thyroid Stimulating Hormone) (84443-TSH) T-Vitamin D (25-Hydroxy) (95638-75643)  Her updated medication list for this problem includes:    Fosamax 70 Mg Tabs (Alendronate sodium) ..... One by mouth weekly  Problem # 4:  HYPERTENSION (ICD-401.9) at goal  Her updated medication list for this problem includes:    Metoprolol Tartrate 50 Mg Tabs (Metoprolol tartrate) .Marland Kitchen... Take one tablet two times a day    Cozaar 50 Mg Tabs (Losartan potassium) .Marland Kitchen... 1 by mouth once daily  Orders: Venipuncture (32951) TLB-BMP (Basic Metabolic Panel-BMET) (80048-METABOL)  BP today: 142/80 Prior BP: 120/80 (05/10/2009)  Labs Reviewed: K+: 3.7 (06/16/2008) Creat: : 0.8 (06/16/2008)   Chol: 221 (06/25/2007)   HDL: 46.9 (06/25/2007)   LDL: DEL (06/25/2007)   TG: 83 (06/25/2007)  Problem # 5:  VARICOSE VEIN (ICD-456.8) has a left  leg varicose veins.   plan: observation, will call if the varicose vein increasing size or hurt  Complete Medication List: 1)  Metoprolol Tartrate 50 Mg Tabs (Metoprolol tartrate) .... Take one tablet two times a day 2)  Omeprazole 40 Mg Cpdr (Omeprazole) .... Take two tablets daily before meals 3)  Ativan 0.5 Mg Tabs (Lorazepam) .... Three times a day as needed 4)  Tens Unit  .... Dx 805.8 5)  Ra Vitamin A & D 5000-400 Unit Caps (Vitamins a & d) .... Take one tablet daily 6)  Stool Softener 100 Mg Caps (Docusate sodium) .... Take one tablet daily 7)  Tums Ultra 1000 1000 Mg Chew (Calcium carbonate antacid) .... As needed 8)  Adult Aspirin Ec Low Strength 81 Mg Tbec (Aspirin) .... Take 1 tablet by mouth once a day 9)  Levsin/sl 0.125 Mg Subl (Hyoscyamine sulfate) .... 2 tablets by mouth sublingually four times a day as needed 10)  Cozaar 50 Mg Tabs (Losartan potassium)  .Marland Kitchen.. 1 by mouth once daily 11)  Clotrimazole 100 Mg Tabs (Clotrimazole) .... Qid 12)  Fosamax 70 Mg Tabs (Alendronate sodium) .... One by mouth weekly  Patient Instructions: 1)  Please schedule a follow-up appointment in 3 months  ( fasting, yearly check up) Prescriptions: ATIVAN 0.5 MG  TABS (LORAZEPAM) three times a day as needed  #90 x 3   Entered and Authorized by:   Elita Quick E. Emberli Ballester MD   Signed by:   Nolon Rod. Moody Robben MD on 07/11/2009   Method used:   Print then Give to Patient   RxID:   [id number] FOSAMAX 70 MG TABS (ALENDRONATE SODIUM) one by mouth weekly  #4 x  6   Entered and Authorized by:   Nolon Rod. Demisha Nokes MD   Signed by:   Nolon Rod. Kenidi Elenbaas MD on 07/11/2009   Method used:   Print then Give to Patient   RxID:   [id number]

## 2010-06-15 NOTE — Letter (Signed)
Summary: Letter--[place]/GASTROENTEROLOGY   Letter--[facility]/GASTROENTEROLOGY   Imported By: Freddy Jaksch 01/01/2007 11:38:58  _____________________________________________________________________  External Attachment:    Type:   Image     Comment:   INTERAL

## 2010-06-15 NOTE — Assessment & Plan Note (Signed)
Summary: return 4 months.cbs   Vital Signs:  Patient profile:   77 year old female Height:      66.5 inches Weight:      180 pounds BMI:     28.72 Pulse rate:   70 / minute Pulse rhythm:   regular BP sitting:   140 / 80  (left arm) Cuff size:   regular  Vitals Entered By: Shary Decamp (August 16, 2008 1:15 PM) Comments rov - pt has migraine HA today Shary Decamp  August 16, 2008 1:21 PM    History of Present Illness: rov -  pt has classic sx of  migraine  today, to see Dr Anne Hahn in few weeks ref: ?botox for HAs Hypertension-- ambulatory BPs ok, occ. DBP in the 90s  Coronary artery disease-- no CP LLQ pain-- better , s/p GI eval s/p fall 2-10, landed on her knees, still has some pain @ L knee, likes a ortho referal  Allergies: 1)  ! Pcn 2)  ! Asa 3)  ! Accupril 4)  ! * Toprol 5)  ! Lescol 6)  ! Zocor 7)  ! Lipitor 8)  ! * Zetia 9)  ! * Crestor 10)  ! Milk of Magnesia (Magnesium Hydroxide) 11)  ! Diovan  Past History:  Past Medical History:    Headache - migraines    Hypertension    varicose veins    Osteoporosis    Hyperlipidemia    Diverticulosis, colon    GERD  (HH)    Coronary artery disease    Cerebrovascular accident, hx of    h/o R leg DVT after a venous ablation    4-09  L2 fracture status post vertebroplasty of L2 September 10, 2007 performed  by IR    4-09 Depression/anxiety -lost husband     Obstructive sleep apnea    Hiatal hernia    Hemorrhoids    Hyperplastic colon polyp, 06/2001    Functional LLQ pain    Osteoarthritis  Past Surgical History:    Reviewed history from 07/05/2008 and no changes required:    Appendectomy    left ovarian cyst    3/07 - stress test neg    Tonsillectomy    Back Surgery    Tubal Ligation  Social History:    Married, lost husband 07-29-07    lives by self    driving     Former Smoker    Alcohol use-yes    children x 3 ( 2 in [place] , see them occasionaly)    Daily Caffeine Use coffee    Illicit Drug Use -  no  Review of Systems       no fever - chill CV:  Denies chest pain or discomfort and palpitations. GI:  Denies nausea and vomiting.  Physical Exam  General:  alert and well-developed.   Lungs:  normal respiratory effort, no intercostal retractions, no accessory muscle use, and normal breath sounds.   Heart:  normal rate, regular rhythm, and no murmur.   Extremities:  No clubbing, cyanosis, edema or deformities noted.   Impression & Recommendations:  Problem # 1:  CORONARY ARTERY DISEASE (ICD-414.00) stable, no recent CP Her updated medication list for this problem includes:    Metoprolol Tartrate 50 Mg Tabs (Metoprolol tartrate) .Marland Kitchen... Take one tablet two times a day    Adult Aspirin Ec Low Strength 81 Mg Tbec (Aspirin) .Marland Kitchen... Take 1 tablet by mouth once a day  Labs Reviewed: Chol: 221 (06/25/2007)  HDL: 46.9 (06/25/2007)   LDL: DEL (06/25/2007)   TG: 83 (06/25/2007)  Problem # 2:  HYPERTENSION (ICD-401.9)  Labs Reviewed: SGOT: 23 (06/25/2007)   SGPT: 16 (06/25/2007)   HDL:46.9 (06/25/2007)  LDL:DEL (06/25/2007)  Chol:221 (06/25/2007)  Trig:83 (06/25/2007)  Her updated medication list for this problem includes:    Metoprolol Tartrate 50 Mg Tabs (Metoprolol tartrate) .Marland Kitchen... Take one tablet two times a day  BP today: 140/80 Prior BP: 120/74 (07/05/2008)  Labs Reviewed: K+: 3.7 (06/16/2008) Creat: : 0.8 (06/16/2008)   Chol: 221 (06/25/2007)   HDL: 46.9 (06/25/2007)   LDL: DEL (06/25/2007)   TG: 83 (06/25/2007)  Problem # 3:  OSTEOPOROSIS (ICD-733.00)  due for DEXA  Orders: Radiology Referral (Radiology)  Problem # 4:  OSTEOARTHRITIS (ICD-715.90)  likes ortho referal, likes to see the same group that operated on her back Her updated medication list for this problem includes:    Adult Aspirin Ec Low Strength 81 Mg Tbec (Aspirin) .Marland Kitchen... Take 1 tablet by mouth once a day  Orders: Orthopedic Referral (Ortho)  Complete Medication List: 1)  Metoprolol Tartrate 50 Mg  Tabs (Metoprolol tartrate) .... Take one tablet two times a day 2)  Omeprazole 40 Mg Cpdr (Omeprazole) .... Take two tablets daily before meals 3)  Ativan 0.5 Mg Tabs (Lorazepam) .... Three times a day as needed 4)  Tens Unit  .... Dx 805.8 5)  Ra Vitamin A & D 5000-400 Unit Caps (Vitamins a & d) .... Take one tablet daily 6)  Stool Softener 100 Mg Caps (Docusate sodium) .... Take one tablet daily 7)  Tums Ultra 1000 1000 Mg Chew (Calcium carbonate antacid) .... As needed 8)  Adult Aspirin Ec Low Strength 81 Mg Tbec (Aspirin) .... Take 1 tablet by mouth once a day 9)  Levsin/sl 0.125 Mg Subl (Hyoscyamine sulfate) .... 2 tablets by mouth sublingually four times a day as needed  Patient Instructions: 1)  Please schedule a YEARLY in 3 months .

## 2010-06-15 NOTE — Assessment & Plan Note (Signed)
Summary: HOSPITAL FU/NS/PN   Vital Signs:  Patient profile:   77 year old female Height:      66.5 inches Weight:      172.2 pounds Temp:     98.7 degrees F Pulse rate:   60 / minute Pulse rhythm:   regular BP sitting:   120 / 80  (left arm) Cuff size:   regular  Vitals Entered By: Shary Decamp (January 05, 2009 4:29 PM) CC: hosp f/u Comments  - left wrist painful & swelling from where IV was placed [facility]  January 05, 2009 4:31 PM    History of Present Illness: hospital chart reviewed:  DATE OF ADMISSION:  12/27/2008 DATE OF DISCHARGE:  01/01/2009   DISCHARGE DIAGNOSES: 1. Urinary tract infection/early pyelonephritis. 2. Oral thrush. 3. Hypertension. 4. Shortness of breath presumed secondary to atelectasis.  LABORATORY DATA:   CBC on December 31, 2008:  WBC 9.5, hemoglobin and hematocrit of 13.4/39.7, platelet count of 358.   Basic metabolic profile:  Sodium 139, potassium 4, chloride 104, CO2 - 30, glucose 113, BUN 3, creatinine 0.83.  BNP on December 29, 2008 - 45.  Blood culture no growth to date.   Urine culture E. coli sensitive to ciprofloxacin. Renal ultrasound  - no hydronephrosis.   Chest x-rayAugust 20, 2010:  Mild diffuse increase in interstitial markings representing a chronic finding, lungs appeared hyperaerated, no other acute chest findings.  Final BCX neg x 2   Current Medications (verified): 1)  Metoprolol Tartrate 50 Mg Tabs (Metoprolol Tartrate) .... Take One Tablet Two Times A Day 2)  Omeprazole 40 Mg Cpdr (Omeprazole) .... Take Two Tablets Daily Before Meals 3)  Ativan 0.5 Mg  Tabs (Lorazepam) .... Three Times A Day As Needed 4)  Tens Unit .... Dx 805.8 5)  Ra Vitamin A & D 5000-400 Unit Caps (Vitamins A & D) .... Take One Tablet Daily 6)  Stool Softener 100 Mg Caps (Docusate Sodium) .... Take One Tablet Daily 7)  Tums Ultra 1000 1000 Mg Chew (Calcium Carbonate Antacid) .... As Needed 8)  Adult Aspirin Ec Low Strength 81 Mg Tbec (Aspirin)  .... Take 1 Tablet By Mouth Once A Day 9)  Levsin/sl 0.125 Mg Subl (Hyoscyamine Sulfate) .... 2 Tablets By Mouth Sublingually Four Times A Day As Needed 10)  Cozaar 50 Mg Tabs (Losartan Potassium) .Marland Kitchen.. 1 By Mouth Once Daily 11)  Clotrimazole 100 Mg Tabs (Clotrimazole) .... Qid 12)  Cipro 500 Mg Tabs (Ciprofloxacin Hcl) .... Two Times A Day  Allergies (verified): 1)  ! Pcn 2)  ! Asa 3)  ! Accupril 4)  ! * Toprol 5)  ! Lescol 6)  ! Zocor 7)  ! Lipitor 8)  ! * Zetia 9)  ! * Crestor 10)  ! Milk of Magnesia (Magnesium Hydroxide) 11)  ! Diovan  Past History:  Past Medical History: Reviewed history from 08/16/2008 and no changes required. Headache - migraines Hypertension varicose veins Osteoporosis Hyperlipidemia Diverticulosis, colon GERD  (HH) Coronary artery disease Cerebrovascular accident, hx of h/o R leg DVT after a venous ablation 4-09  L2 fracture status post vertebroplasty of L2 September 10, 2007 performed  by IR 4-09 Depression/anxiety -lost husband  Obstructive sleep apnea Hiatal hernia Hemorrhoids Hyperplastic colon polyp, 06/2001 Functional LLQ pain Osteoarthritis  Social History: Reviewed history from 12/27/2008 and no changes required. Married, lost husband 07-29-07 lives by self driving  Former Smoker: quit 1990 Alcohol use-yes children x 3 ( 2 in [place] , see them occasionaly)  Daily Caffeine Use coffee  Review of Systems       since she left the hospital feels tired  General:  Denies fever; appetite poor , she is not eating much  no post prandial abd pain or nausea . CV:  Denies chest pain or discomfort and shortness of breath with exertion. GI:  Denies bloody stools and diarrhea. GU:  Denies dysuria and hematuria. Derm:  had redness at the L hand before she left the hospital, not getting better, slightly  worse?.  Physical Exam  General:  alert and well-developed.   Lungs:  normal respiratory effort, no intercostal retractions, no accessory muscle  use, and normal breath sounds.   Heart:  normal rate, regular rhythm, and no murmur.   Abdomen:  soft, normal bowel sounds, no distention, no masses, no guarding, no rigidity, and no inguinal hernia.   slightly  tender at L suprapubic area  Extremities:  no pretibial edema bilaterally  At L wrist has a 3x2 inch area of swelling, redness, no fluctuant , slightly  tenderness     Impression & Recommendations:  Problem # 1:  A/P --s/p pyelonephritis extensive hospital chart review s/p abx, doing well recheck UCX , see instructions  -- cellulitis, L hand at the site of an IV acces when she was at the hospital rec a warm compress doxy x 5 days --poor appetite, encouraged fluids  ---LLQ pain chronic, observe  --- also developing some vag itch, yeast infection? rec trial w/ monistat  Problem # 2:  PYELONEPHRITIS, ACUTE (ICD-590.10) see #1  Problem # 3:  CELLULITIS, HAND, LEFT (ICD-682.4) see #1 Her updated medication list for this problem includes:    Doxycycline Hyclate 100 Mg Caps (Doxycycline hyclate) .Marland Kitchen... 1 by mouth two times a day x 5 days  Problem # 4:  ABDOMINAL PAIN-LUQ (ICD-789.02) see #1  Complete Medication List: 1)  Metoprolol Tartrate 50 Mg Tabs (Metoprolol tartrate) .... Take one tablet two times a day 2)  Omeprazole 40 Mg Cpdr (Omeprazole) .... Take two tablets daily before meals 3)  Ativan 0.5 Mg Tabs (Lorazepam) .... Three times a day as needed 4)  Tens Unit  .... Dx 805.8 5)  Ra Vitamin A & D 5000-400 Unit Caps (Vitamins a & d) .... Take one tablet daily 6)  Stool Softener 100 Mg Caps (Docusate sodium) .... Take one tablet daily 7)  Tums Ultra 1000 1000 Mg Chew (Calcium carbonate antacid) .... As needed 8)  Adult Aspirin Ec Low Strength 81 Mg Tbec (Aspirin) .... Take 1 tablet by mouth once a day 9)  Levsin/sl 0.125 Mg Subl (Hyoscyamine sulfate) .... 2 tablets by mouth sublingually four times a day as needed 10)  Cozaar 50 Mg Tabs (Losartan potassium) .Marland Kitchen.. 1 by  mouth once daily 11)  Clotrimazole 100 Mg Tabs (Clotrimazole) .... Qid 12)  Doxycycline Hyclate 100 Mg Caps (Doxycycline hyclate) .Marland Kitchen.. 1 by mouth two times a day x 5 days  Patient Instructions: 1)  lots of fluids 2)  doxycycine and a warm compress at the L wrist. Call if the area gets more red 3)  came back in 2 weeks for a UCX Dx UTI 4)  Please schedule a follow-up appointment in 2 months (routine visit) Prescriptions: DOXYCYCLINE HYCLATE 100 MG CAPS (DOXYCYCLINE HYCLATE) 1 by mouth two times a day x 5 days  #10 x 0   Entered and Authorized by:   Nolon Rod. Maryam Feely MD   Signed by:   Nolon Rod. Chrisma Hurlock MD on  01/05/2009   Method used:   Print then Give to Patient   RxID:   [id number]

## 2010-06-15 NOTE — Assessment & Plan Note (Signed)
Summary: roa 6 months,cbs   Vital Signs:  Patient Profile:   77 Years Old Female Height:     66.5 inches Weight:      177.4 pounds Pulse rate:   60 / minute BP sitting:   122 / 90  Vitals Entered By: Shary Decamp (April 13, 2008 1:31 PM)                 Chief Complaint:  rov - c/o of muscle spasms in left calf/thigh x 3 weeks.  History of Present Illness: rov -  ---c/o of muscle spasms in left calf/thigh x 3 weeks, no edema , pain med  helps ---Hypertension--BP  elevated lately (190s/90s-100) , Cardiology rec to increase Diovan to 2 tabs a day ,still BP slightly  elevated to 150s/80s ---varicose veins-- wonders if pain in the L calf related to varicose veins; was recommended to do surgery on those veins but afraid to proceed due to complications  (DVT) w/ similar procedure in the R leg ---GERD  (HH)--was seen w/  dyspepsia last OV, symptoms better  ---Depression/anxiety --still some crying on-off , feels sad  but remains active in [place], does have friends and goes out     Current Allergies (reviewed today): ! PCN ! ASA ! ACCUPRIL ! * TOPROL ! LESCOL ! ZOCOR ! LIPITOR ! * ZETIA ! * CRESTOR ! MILK OF MAGNESIA (MAGNESIUM HYDROXIDE)  Past Medical History:    Reviewed history from 10/08/2007 and no changes required:       Headache - migraines       Hypertension       varicose veins       Osteoporosis       Hyperlipidemia       Diverticulosis, colon       GERD  (HH)       Coronary artery disease       Cerebrovascular accident, hx of       h/o R leg DVT after a venous ablation       4-09  L2 fracture status post vertebroplasty of L2 September 10, 2007 performed   by interventional radiology.        4-09 Depression/anxiety -lost husband   Past Surgical History:    Reviewed history from 06/25/2007 and no changes required:       Appendectomy       left l ovarian cyst       3/07 - stress test neg   Social History:    Reviewed history from 02/11/2008 and no  changes required:       Married, lost husband 07-29-07       lives by self        driving        Former Smoker       Alcohol use-yes       children x 3 ( 2 in [place] , see them occasionaly)    Review of Systems  General      Denies fever and weight loss.  CV      (+) CP, patient spoke w/  cardiology about it, they in creased the Diovan and no CP since  Resp      Denies cough.      occ SOB (no new , on-off)  GI      Denies abdominal pain and bloody stools.  Neuro      Denies headaches.   Physical Exam  General:     alert and well-developed.  Neck:     no thyromegaly.   Lungs:     normal respiratory effort, no intercostal retractions, no accessory muscle use, and normal breath sounds.   Heart:     normal rate, regular rhythm, and no murmur.   Abdomen:     soft, non-tender, no distention, and no masses.   Extremities:     no pretibial edema bilaterally  Calves symetric L legs has varicose veins, they are not swollen, red or tender  Psych:     Oriented X3, memory intact for recent and remote, normally interactive, good eye contact, not anxious appearing, and not depressed appearing.      Impression & Recommendations:  Problem # 1:  ANXIETY DEPRESSION (ICD-300.4) counseled   Problem # 2:  CORONARY ARTERY DISEASE (ICD-414.00) see ROS, had CP but symptoms are now better,last episode 2 weeks ago  Rec ER and/or call Dr Rollen Sox if CP came back Her updated medication list for this problem includes:    Diovan 160 Mg Tabs (Valsartan) .Marland Kitchen... Take 2  tablet daily    Metoprolol Tartrate 50 Mg Tabs (Metoprolol tartrate) .Marland Kitchen... Take one tablet two times a day  Labs Reviewed: Chol: 221 (06/25/2007)   HDL: 46.9 (06/25/2007)   LDL: 161.0 (06/25/2007)   TG: 83 (06/25/2007)   Problem # 3:  GERD (ICD-530.81) dyspepsia better  Her updated medication list for this problem includes:    Omeprazole 40 Mg Cpdr (Omeprazole) .Marland Kitchen... Take two tablets daily before meals    Levsin 0.125  Mg Tabs (Hyoscyamine sulfate) .Marland Kitchen... 1 opr 2 by mouth qid as needed abdominal pain   Problem # 4:  HYPERTENSION (ICD-401.9) on a increased dose of diovan. See HPI, BP #s better with more diovan will forward BMP to cards  Her updated medication list for this problem includes:    Diovan 160 Mg Tabs (Valsartan) .Marland Kitchen... Take 2  tablet daily    Metoprolol Tartrate 50 Mg Tabs (Metoprolol tartrate) .Marland Kitchen... Take one tablet two times a day  Orders: Venipuncture (78295) TLB-BMP (Basic Metabolic Panel-BMET) (80048-METABOL)  BP today: 122/90 Prior BP: 130/80 (02/11/2008)  Labs Reviewed: Creat: 0.8 (06/25/2007) Chol: 221 (06/25/2007)   HDL: 46.9 (06/25/2007)   LDL: 161.0 (06/25/2007)   TG: 83 (06/25/2007)   Complete Medication List: 1)  Diovan 160 Mg Tabs (Valsartan) .... Take 2  tablet daily 2)  Metoprolol Tartrate 50 Mg Tabs (Metoprolol tartrate) .... Take one tablet two times a day 3)  Omeprazole 40 Mg Cpdr (Omeprazole) .... Take two tablets daily before meals 4)  Ativan 0.5 Mg Tabs (Lorazepam) .... Three times a day as needed 5)  Hydrocodone-acetaminophen 5-325 Mg Tabs (Hydrocodone-acetaminophen) .... 2 by mouth every 4 hours prn 6)  Tens Unit  .... Dx 805.8 7)  Fioricet 50-325-40 Mg Tabs (Butalbital-apap-caffeine) .Marland Kitchen.. 1 by mouth qid as needed headache 8)  Ra Vitamin A & D 5000-400 Unit Caps (Vitamins a & d) .... Take one tablet daily 9)  Stool Softener 100 Mg Caps (Docusate sodium) .... Take one tablet daily 10)  Levsin 0.125 Mg Tabs (Hyoscyamine sulfate) .Marland Kitchen.. 1 opr 2 by mouth qid as needed abdominal pain   Patient Instructions: 1)  Please schedule a follow-up appointment in 4 months.   ]

## 2010-06-15 NOTE — Progress Notes (Signed)
Summary: [facility]  Phone Note From Other Clinic   Caller: [name] Call For: [facility] Summary of Call: [name]  [name] from [facility] called and wanted to tell [name] that [name] will be discharged this week  instead of next week because she stop taking her pain meds and she is doing really well.  [name] said if you have any quetions you can give her a call  [phone] Initial call taken by: [name],  October 21, 2007 2:28 PM  Follow-up for Phone Call        noted Follow-up by: [name],  October 21, 2007 7:04 PM

## 2010-06-15 NOTE — Procedures (Signed)
Summary: Colonoscopy   Colonoscopy  Procedure date:  07/14/2008  Findings:      Location:  [facility].    COLONOSCOPY PROCEDURE REPORT  PATIENT:  Dawn, Parrish  MR#:  [id number] BIRTHDATE:   [birth year]   GENDER:   female  ENDOSCOPIST:   Venita Lick. Russella Dar, MD, [facility]    PROCEDURE DATE:  07/14/2008 PROCEDURE:  Colonoscopy, diagnostic ASA CLASS:   Class II INDICATIONS: 1) abdominal pain   MEDICATIONS:    Fentanyl 100 mcg IV, Versed 6 mg IV  DESCRIPTION OF PROCEDURE:   After the risks benefits and alternatives of the procedure were thoroughly explained, informed consent was obtained.  Digital rectal exam was performed and revealed no abnormalities.  The LB [id number]-[id number] endoscope was introduced through the anus and advanced to the cecum, which was identified by both the appendix and ileocecal valve, without limitations. The quality of the prep was excellent, using MoviPrep.  The instrument was then slowly withdrawn as the colon was fully examined. <<PROCEDUREIMAGES>>          <<OLD IMAGES>>  FINDINGS:  Mild diverticulosis was found in the sigmoid colon and in the transverse colon. This was otherwise a normal examination. Retroflexed views in the rectum revealed no abnormalities. The time to cecum =  2  minutes. The scope was then withdrawn (time =  11  min) from the patient and the procedure completed.  COMPLICATIONS:   None  ENDOSCOPIC IMPRESSION:  1) Mild diverticulosis in the sigmoid colon and transverse colon   RECOMMENDATIONS:  1) high fiber diet 2) antispasmotic prn  REPEAT EXAM:   No   Kerrie Timm T. Russella Dar, MD, [facility]    CC: Willow Ora MD

## 2010-06-15 NOTE — Progress Notes (Signed)
Summary: [name]--Information anout her Mother  Phone Note Call from Patient Call back at [name]   Caller: Daughter Summary of Call: Pt daughter is calling to let Dr. Drue Novel know that her mother has been admitted to [facility]  last night. Only want to talk to Dr.Eliberto Sole about this.  please call [name] at [phone] o [phone]-- mobile Initial call taken by: Freddy Jaksch,  September 08, 2007 9:41 AM  Follow-up for Phone Call        Daughter wants Dr. Drue Novel to call her.  Her father [name] passed away about 5 weeks ago.  Right after that patient started having back pain and went to [facility] MRI confirmed compressed disc & compression fracture.  Patient was given pain medication but will not take.  Last week she started having panic attacks & increased pain in her back.  She had a feeling like she was "suffocating".  She went to an urgent care and to the ER 3 times.  She was finally admitted by Dr. Mayford Knife yesterday.  The daughter is having a really hard time dealing with her mother because she is non-compliant.   Can you please call her today?  I advised her that you were with patients but I would relay the message to you.  Daughter was very upset & frustrated on the phone.   ..................................................................Marland KitchenShary Decamp  September 08, 2007 11:40 AM not in her room [phone])....................................................................Keeyon Privitera E. Shariyah Eland MD  September 08, 2007 2:39 PM   called pt's daughter:no anwser; called pt, she is at [facility], to have a Vertebroplasty tomorrow. lost her husband recently; counseled, she will RTC after her  hospital stay.Liliahna Cudd E. Silas Sedam MD  September 09, 2007 5:18 PM

## 2010-06-15 NOTE — Letter (Signed)
Summary: [facility]   Imported By: Freddy Jaksch 01/01/2007 11:35:53  _____________________________________________________________________  External Attachment:    Type:   Image     Comment:   External Document

## 2010-06-15 NOTE — Progress Notes (Signed)
Summary: dexa results  Phone Note Outgoing Call Call back at Home Phone [phone]   Summary of Call: DEXA RESULTS: had dexa 11/23/2005   ++ osteoporosis , pt was going to do her own research on meds and apparently  she never started on any meds d/w : she is at risk for fractures, I rec treatment w/ oral meds plus Ca Vit D and daily walks options Fosamax q w (inexpensive) , Actonel or boniva q month (expensive but convinient, ok samples if so desire) if she agrees to take meds, call it in and discuss precautions  also question of foreign body on DEXA: get a LS spine XR Signed by Elita Quick E. Aadvika Konen MD on 09/26/2008 at 3:00 PM   Follow-up for Phone Call        Spoke with pt:   - explained to pt the benefits of meds (pt rereluctant  to take).    - advised pt to continue vit D, Cal, & exercise  - pt has rov 7/10 -- she will think about meds & discuss w/MD then  Patient had vertebroplasty 09/10/07 -- could that be the foreign body that they saw on DEXA?  Patient willing to go for XR if you think it is necessary. Shary Decamp  Sep 28, 2008 9:46 AM please  arrange Nolon Rod. Wakeelah Solan MD  Sep 28, 2008 6:04 PM  [facility] for pt to return call Shary Decamp  Sep 29, 2008 9:59 AM discussed with pt -- order for xray done Shary Decamp  Sep 29, 2008 1:35 PM

## 2010-06-15 NOTE — Miscellaneous (Signed)
Summary: mammo  Clinical Lists Changes  Observations: Added new observation of MAMMOGRAM: ASSESSMENT: Negative - BI-RADS 1 (03/21/2010 7:40)      Preventive Care Screening  Mammogram:    Date:  03/21/2010    Results:  ASSESSMENT: Negative - BI-RADS 1

## 2010-06-15 NOTE — Miscellaneous (Signed)
Summary: [facility]   Imported By: Freddy Jaksch 10/24/2007 09:15:11  _____________________________________________________________________  External Attachment:    Type:   Image     Comment:   External Document

## 2010-06-15 NOTE — Assessment & Plan Note (Signed)
Summary: ABD TENDERNESS/LEFT LOWER ABD PAIN/YF   History of Present Illness Visit Type: consult Primary GI MD: Elie Goody MD [facility] Primary Provider: Willow Ora, MD Requesting Provider: Willow Ora, MD Chief Complaint: lower left abdominal tenderness and pain History of Present Illness:   [name] relates a 40 year history of mild, episodic left lower quadrant abdominal pain associated with gas and bloating. I have seen her in the past for this problem. She underwent colonoscopy in February 2003 that showed internal hemorrhoids, diverticulosis and a hyperplastic colon polyp. She was treated with hyoscyamine b.i.d. p.r.n. for her symptoms as recently as November 2005.  She saw Dr. Bosie Clos for evaluation of GERD in 2006 and underwent an upper GI series on 04/18/2005 that showed a hiatal hernia with sliding and paraesophageal components. Her reflux symptoms remain remained well controlled on daily omeprazole. She has mild constipation that is well controlled with daily stool softeners.   GI Review of Systems    Reports abdominal pain and  bloating.     Location of  Abdominal pain: LLQ.    Denies acid reflux, belching, chest pain, dysphagia with liquids, dysphagia with solids, heartburn, loss of appetite, nausea, vomiting, vomiting blood, weight loss, and  weight gain.        Denies anal fissure, black tarry stools, change in bowel habit, constipation, diarrhea, diverticulosis, fecal incontinence, heme positive stool, hemorrhoids, irritable bowel syndrome, jaundice, light color stool, liver problems, rectal bleeding, and  rectal pain.   Prior Medications Reviewed Using: List Brought by Patient  Updated Prior Medication List: METOPROLOL TARTRATE 50 MG TABS (METOPROLOL TARTRATE) take one tablet two times a day OMEPRAZOLE 40 MG CPDR (OMEPRAZOLE) take two tablets daily before meals ATIVAN 0.5 MG  TABS (LORAZEPAM) three times a day as needed * TENS UNIT dx 805.8 RA VITAMIN A & D 5000-400 UNIT  CAPS (VITAMINS A & D) take one tablet daily STOOL SOFTENER 100 MG CAPS (DOCUSATE SODIUM) take one tablet daily LEVSIN 0.125 MG TABS (HYOSCYAMINE SULFATE) 1 opr 2 by mouth qid as needed abdominal pain TUMS ULTRA 1000 1000 MG CHEW (CALCIUM CARBONATE ANTACID) as needed ADULT ASPIRIN EC LOW STRENGTH 81 MG TBEC (ASPIRIN) Take 1 tablet by mouth once a day  Current Allergies (reviewed today): ! PCN ! ASA ! ACCUPRIL ! * TOPROL ! LESCOL ! ZOCOR ! LIPITOR ! * ZETIA ! * CRESTOR ! MILK OF MAGNESIA (MAGNESIUM HYDROXIDE) ! DIOVAN Past Medical History:    Reviewed history from 07/02/2008 and no changes required:       Headache - migraines       Hypertension       varicose veins       Osteoporosis       Hyperlipidemia       Diverticulosis, colon       GERD  (HH)       Coronary artery disease       Cerebrovascular accident, hx of       h/o R leg DVT after a venous ablation       4-09  L2 fracture status post vertebroplasty of L2 September 10, 2007 performed  by IR       4-09 Depression/anxiety -lost husband        Obstructive sleep apnea       Hiatal hernia       Hemorrhoids       Hyperplastic colon polyp, 06/2001       Functional LLQ pain  Past Surgical History:  Appendectomy    left ovarian cyst    3/07 - stress test neg    Tonsillectomy    Back Surgery    Tubal Ligation  Family History:    colon polyps--cousin    colon ca-- cousin    Family History of Breast Cancer: Mother, Sister    Family History of Heart Disease: Father  Social History:    Married, lost husband 07-29-07    lives by self     driving     Former Smoker    Alcohol use-yes    children x 3 ( 2 in [place] , see them occasionaly)    Daily Caffeine Use coffee    Illicit Drug Use - no  Risk Factors:  Drug use:  no  Review of Systems       The pertinent positives and negatives are noted as above and in the HPI. All other ROS were negative.   Vital Signs:  Patient Profile:   77 Years Old Female Height:      66.5 inches Weight:      182 pounds BMI:     29.04 Pulse rate:   64 / minute Pulse rhythm:   regular BP sitting:   120 / 74  (left arm) Cuff size:   regular  Vitals Entered By: Francee Piccolo CMA (July 05, 2008 2:34 PM)                  Physical Exam  General:     Well developed, well nourished, no acute distress. Head:     Normocephalic and atraumatic. Eyes:     PERRLA, no icterus. Ears:     Normal auditory acuity. Mouth:     No deformity or lesions, dentition normal. Neck:     Supple; no masses or thyromegaly. Lungs:     Clear throughout to auscultation. Heart:     Regular rate and rhythm; no murmurs, rubs,  or bruits. Abdomen:     Soft and nondistended. No masses, hepatosplenomegaly or hernias noted. Normal bowel sounds. Very minimal focal left lower quadrant tenderness without rebound or guarding Rectal:     deferred until time of colonoscopy.   Msk:     Symmetrical with no gross deformities. Normal posture. Pulses:     Normal pulses noted. Extremities:     No clubbing, cyanosis, edema or deformities noted. Neurologic:     Alert and  oriented x4;  grossly normal neurologically. Cervical Nodes:     No significant cervical adenopathy. Psych:     Alert and cooperative. Normal mood and affect.   Impression & Recommendations:  Problem # 1:  ABDOMINAL PAIN, LEFT LOWER QUADRANT (ICD-789.04) Chronic, recurrent left lower quadrant pain. I suspect this is a functional problem. Rule out colorectal neoplasms and other disorders. Hyoscyamine SL 1-2 sublingually q.i.d. p.r.n. The risks, benefits and alternatives to colonoscopy with possible biopsy and possible polypectomy were discussed with the patient and they consent to proceed. The procedure will be scheduled electively. Orders:  Colonoscopy (Colon)    Problem # 2:  FLATULENCE ERUCTATION AND GAS PAIN (ICD-787.3) Low gas diet and a trial of Xifaxan 400 mg t.i.d. for 10 days.    Problem # 3:  GERD  (ICD-530.81) Continue standard antireflux measures and omeprazole 40 mg q.a.m.  Patient Instructions: 1)  You have been scheduled for a colonoscopy.  2)  Colonoscopy brochure given. 3)  Conscious Sedation brochure given. 4)  Pick up prescription at your pharmacy.  5)  Copy Sent  To: Willow Ora, MD  Prescriptions: LEVSIN/SL 0.125 MG SUBL (HYOSCYAMINE SULFATE) 2 tablets by mouth sublingually four times a day as needed  #60 x 11   Entered by:   Christie Nottingham CMA   Authorized by:   Meryl Dare MD [facility]   Signed by:   Christie Nottingham CMA on 07/05/2008   Method used:   Electronically to        [facility] [address] (retail)       [address] [place], [place]  [address]       Ph: [phone] or [phone]       Fax: [phone]   RxID:   [id number] XIFAXAN 200 MG TABS (RIFAXIMIN) 2 tablets by mouth three times a day  #36 x 0   Entered by:   Christie Nottingham CMA   Authorized by:   Meryl Dare MD [facility]   Signed by:   Christie Nottingham CMA on 07/05/2008   Method used:   Electronically to        [facility] [address] (retail)       [address] [place], [place]  [address]       Ph: [phone] or [phone]       Fax: [phone]   RxID:   [id number] MOVIPREP 100 GM  SOLR (PEG-KCL-NACL-NASULF-NA ASC-C) As per prep instructions.  #1 x 0   Entered by:   Christie Nottingham CMA   Authorized by:   Meryl Dare MD [facility]   Signed by:   Christie Nottingham CMA on 07/05/2008   Method used:   Electronically to        [facility] [address] (retail)       [address] [place], [place]  [address]       Ph: [phone] or [phone]       Fax: [phone]   RxID:   [id number]

## 2010-06-15 NOTE — Assessment & Plan Note (Signed)
Summary: yearly /kdc   Vital Signs:  Patient profile:   77 year old female Height:      66.5 inches Weight:      171 pounds Temp:     98.2 degrees F oral Pulse rate:   72 / minute Resp:     20 per minute BP sitting:   122 / 72  (left arm)  Vitals Entered By: Jeremy Johann CMA (October 19, 2009 9:33 AM) CC: yearly Comments --fasting REVIEWED MED LIST, PATIENT AGREED DOSE AND INSTRUCTION CORRECT    History of Present Illness: yearly, chart reviewed    Hypertension-- usually ok, occasionally SBP goes to the 180s  Osteoporosis-- on fosamax, no s/e that she can tell   OA with bilateral knee pain, last local injection 5-11 , they do help   Probable Takotsubo cardiomyopathy: s/p cards visit, note reviewed   Allergies: 1)  ! Pcn 2)  ! Asa 3)  ! Accupril 4)  ! * Toprol 5)  ! Lescol 6)  ! Zocor 7)  ! Lipitor 8)  ! * Zetia 9)  ! * Crestor 10)  ! Milk of Magnesia (Magnesium Hydroxide) 11)  ! Diovan  Past History:  Past Medical History: 1. Headache - migraines 2. Hypertension 3. Hyperlipidemia 4. Osteoporosis 5. Coronary artery disease: Mild nonobstructive disease on cath in 2003.  6  CVA 7. GI issues: GERD Hiatal hernia, Hemorrhoids, Hyperplastic colon polyp, 06/2001, Functional LLQ pain, h/o diverticuli 8. Obstructive sleep apnea 9. varicose veins 10. h/o R leg DVT after a venous ablation 11. OA with bilateral knee pain 12. 4-09  L2 fracture status post vertebroplasty of L2 September 10, 2007 performed  by IR 13.  Depression/anxiety -lost husband  71.  Probable Takotsubo cardiomyopathy: Severe chest pain with normal cath in 1994.  Severe chest pain in 2003 with widespread T wave inversions on ECG.  Cath with minimal coronary disease but LV-gram showed periapical severe hypokinesis and basilar hyperkinesia (EF 40%).  Last echo in 4/09 confirmed full LV functional recovery with EF 60%, no regional wall motion abnormalities, mild to moderate LVH.   Past Surgical  History: Reviewed history from 05/10/2009 and no changes required. Appendectomy left ovarian cyst 3/07 - stress test neg Tonsillectomy Back Surgery Tubal Ligation Dental extraction  L maxillary molar Kyphoplasty 08/2007  Family History: colon polyps--cousin colon ca-- cousin Breast Cancer: Mother, Sister Family History of Heart Disease, MI s: Father Mother Multiple Myeloma leukemia--GM B  Social History: Reviewed history from 08/03/2009 and no changes required. Married, lost husband 07-29-07.  Lives in [place].  lives by self driving  Former Smoker: quit 1990 Alcohol use-yes children x 3 ( 2 in [place] , see them occasionaly) Daily Caffeine Use coffee  Review of Systems General:  "fatigue a lot of times" but otherwise feels good  sweats a lot in the mornings . CV:  Denies chest pain or discomfort and swelling of feet. Resp:  Denies cough and shortness of breath. GI:  Denies bloody stools, diarrhea, nausea, and vomiting. GU:  Denies dysuria and hematuria.  Physical Exam  General:  alert and well-developed.   Neck:  No deformities, masses, or tenderness noted. normal carotid upstroke.   Lungs:  normal respiratory effort, no intercostal retractions, no accessory muscle use, and normal breath sounds.   Heart:  normal rate, regular rhythm, and no murmur.   Abdomen:  soft, non-tender, no distention, no masses, no guarding, and no rigidity.   Extremities:  no pretibial edema bilaterally  Psych:  Oriented X3, memory intact for recent and remote, normally interactive, good eye contact, not anxious appearing, and not depressed appearing.     Impression & Recommendations:  Problem # 1:  HYPERTENSION (ICD-401.9) at goal  Her updated medication list for this problem includes:    Metoprolol Tartrate 50 Mg Tabs (Metoprolol tartrate) .Marland Kitchen... Take one tablet two times a day    Cozaar 50 Mg Tabs (Losartan potassium) .Marland Kitchen... 1 by mouth once daily    Furosemide 20 Mg Tabs (Furosemide)  .Marland Kitchen... Take one tablet once daily as needed  BP today: 122/72 Prior BP: 140/80 (10/06/2009)  Labs Reviewed: K+: 3.9 (08/01/2009) Creat: : 0.8 (08/01/2009)   Chol: 221 (06/25/2007)   HDL: 46.9 (06/25/2007)   LDL: DEL (06/25/2007)   TG: 83 (06/25/2007)  Orders: TLB-CBC Platelet - w/Differential (85025-CBCD)  Problem # 2:  HEALTH SCREENING (ICD-V70.0)  Td 07 Pneumonia shot has declined it before  and today , explained the benefits shingles shot discussed-- declined , explained the benefits  Cscope 2003: polyps  Dr Russella Dar,  repeated Cscope   07-2008 had mild diverticulosis   Female care  Gyn at [facility] : Dr Vear Clock --MMG PAPs done there. last visit 2009? now sees a gyn in [place], last OV per pt  9--2011 (name?) last MMG in the system 2008, states had one in 2010  see instructions     Problem # 3:  ANXIETY DEPRESSION (ICD-300.4) stable, continue present care  Problem # 4:  HYPERLIPIDEMIA (ICD-272.4) due for labs Labs Reviewed: SGOT: 23 (06/25/2007)   SGPT: 16 (06/25/2007)   HDL:46.9 (06/25/2007)  LDL:DEL (06/25/2007)  Chol:221 (06/25/2007)  Trig:83 (06/25/2007)  Orders: Venipuncture (04540) TLB-Lipid Panel (80061-LIPID)  Problem # 5:  OSTEOPOROSIS (ICD-733.00) tolerating well Fosamax, on vitamin D supplements. Check a vitamin D level Her updated medication list for this problem includes:    Fosamax 70 Mg Tabs (Alendronate sodium) ..... One by mouth weekly    Vitamin D 1000 Unit Tabs (Cholecalciferol) .Marland Kitchen... Take 1 tab once daily   Orders: T-Vitamin D (25-Hydroxy) (98119-14782)  Complete Medication List: 1)  Metoprolol Tartrate 50 Mg Tabs (Metoprolol tartrate) .... Take one tablet two times a day 2)  Omeprazole 40 Mg Cpdr (Omeprazole) .... Take one capsule daily 3)  Ativan 0.5 Mg Tabs (Lorazepam) .... Three times a day as needed 4)  Tens Unit  .... Dx 805.8 5)  Stool Softener 100 Mg Caps (Docusate sodium) .... Take one tablet daily 6)  Tums Ultra 1000 1000 Mg Chew (Calcium  carbonate antacid) .... As needed 7)  Adult Aspirin Ec Low Strength 81 Mg Tbec (Aspirin) .... Take 1 tablet by mouth once a day 8)  Levsin/sl 0.125 Mg Subl (Hyoscyamine sulfate) .... 2 tablets by mouth sublingually four times a day as needed 9)  Cozaar 50 Mg Tabs (Losartan potassium) .Marland Kitchen.. 1 by mouth once daily 10)  Fosamax 70 Mg Tabs (Alendronate sodium) .... One by mouth weekly 11)  Furosemide 20 Mg Tabs (Furosemide) .... Take one tablet once daily as needed 12)  Tylenol With Codeine #3 300-30 Mg Tabs (Acetaminophen-codeine) .... Take one tablet every 8 hours as needed for migraine 13)  Vitamin D 1000 Unit Tabs (Cholecalciferol) .... Take 1 tab once daily  Patient Instructions: 1)  please let us know the name of the  gynecologist you saw and the name of  the facility where you had a mammogram 2)  Please schedule a follow-up appointment in 6 months .    Pneumovax Immunization History:  Pneumovax # 1:  declined  (10/19/2009)

## 2010-06-15 NOTE — Letter (Signed)
Summary: [facility]  [facility]   Imported By: Lanelle Bal 04/18/2009 13:20:27  _____________________________________________________________________  External Attachment:    Type:   Image     Comment:   External Document

## 2010-06-15 NOTE — Assessment & Plan Note (Signed)
Summary: SIDE PAIN/ALR   Vital Signs:  Patient profile:   77 year old female Height:      66.5 inches Weight:      175 pounds BMI:     27.92 Pulse rate:   70 / minute BP sitting:   140 / 80  Vitals Entered By: Kandice Hams (Oct 06, 2009 10:36 AM) CC: pain left side Comments pt requesting refill of her tylenol with codeine she has been using for her hip pain, seen by Dr Yisroel Ramming and given hydrocodone, but not taking does not like the way it makes her feel   History of Present Illness: likes a  refill of codeine which she uses for migraine headaches, she has figured that codeine helps  better than hydrocodone for her arthritis pain. Currently he has some pain in the knee, she just had a local injection yesterday, hopeful that the pain will get better but still would like some codeine prescribed in case she hurts  Also having exacerbation of her chronic, on and off, left lower quadrant abdominal pain for the last 2 weeks This particular exacerbation seems similar to previous episodes, see review of systems  note from cardiology reviewed, she was stable, they were planning  a echocardiogram  Allergies: 1)  ! Pcn 2)  ! Asa 3)  ! Accupril 4)  ! * Toprol 5)  ! Lescol 6)  ! Zocor 7)  ! Lipitor 8)  ! * Zetia 9)  ! * Crestor 10)  ! Milk of Magnesia (Magnesium Hydroxide) 11)  ! Diovan  Past History:  Past Medical History: Reviewed history from 08/03/2009 and no changes required. 1. Headache - migraines 2. Hypertension 3. Hyperlipidemia 4. Osteoporosis 5. Coronary artery disease: Mild nonobstructive disease on cath in 2003.  6  CVA 7. GI issues: GERD Hiatal hernia, Hemorrhoids, Hyperplastic colon polyp, 06/2001, Functional LLQ pain, h/o diverticuli 8. Obstructive sleep apnea 9. varicose veins 10. h/o R leg DVT after a venous ablation 11. OA with bilateral knee pain 12. 4-09  L2 fracture status post vertebroplasty of L2 September 10, 2007 performed  by IR 13.  Depression/anxiety  -lost husband  35.  Probable Takotsubo cardiomyopathy: Severe chest pain with normal cath in 1994.  Severe chest pain in 2003 with widespread T wave inversions on ECG.  Cath with minimal coronary disease but LV-gram showed periapical severe hypokinesis and basilar hyperkinesia (EF 40%).  Last echo in 4/09 confirmed full LV functional recovery with EF 60%, no regional wall motion abnormalities, mild to moderate LVH.   Past Surgical History: Reviewed history from 05/10/2009 and no changes required. Appendectomy left ovarian cyst 3/07 - stress test neg Tonsillectomy Back Surgery Tubal Ligation Dental extraction  L maxillary molar Kyphoplasty 08/2007  Social History: Reviewed history from 08/03/2009 and no changes required. Married, lost husband 07-29-07.  Lives in [place].  lives by self driving  Former Smoker: quit 1990 Alcohol use-yes children x 3 ( 2 in [place] , see them occasionaly) Daily Caffeine Use coffee  Review of Systems       denies fevers No nausea, vomiting, diarrhea BMs are regular, no blood in the stools Appetite is normal No dysuria or hematuria  Physical Exam  General:  alert and well-developed.   Abdomen:  soft.  no tenderness to deep palpation of the lower abdomen and flanks. No mass, no rigidity, no rebound. No CVS tenderness Extremities:  no lower extremity edema Rotation of the hips is not particularly painful   Impression & Recommendations:  Problem # 1:  OSTEOARTHRITIS (ICD-715.90) see HPI RF codeine Her updated medication list for this problem includes:    Adult Aspirin Ec Low Strength 81 Mg Tbec (Aspirin) .Marland Kitchen... Take 1 tablet by mouth once a day    Tylenol With Codeine #3 300-30 Mg Tabs (Acetaminophen-codeine) .Marland Kitchen... Take one tablet every 8 hours as needed for migraine  Problem # 2:  ABDOMINAL PAIN, LEFT LOWER QUADRANT (ICD-789.04) I am somehow concerned about her 2 week history of left lower quadrant pain however  this is a episodic problem, she is  afebrile, urinalysis negative, exam is benign. chart is reviewed, she saw GI  in 2010  , had a cscope that showed tics Plan: call  any time if symptoms increase call  if fever, nausea, vomiting, lack of appetite Patient verbalized understanding RF levsin    Complete Medication List: 1)  Metoprolol Tartrate 50 Mg Tabs (Metoprolol tartrate) .... Take one tablet two times a day 2)  Omeprazole 40 Mg Cpdr (Omeprazole) .... Take one capsule daily 3)  Ativan 0.5 Mg Tabs (Lorazepam) .... Three times a day as needed 4)  Tens Unit  .... Dx 805.8 5)  Stool Softener 100 Mg Caps (Docusate sodium) .... Take one tablet daily 6)  Tums Ultra 1000 1000 Mg Chew (Calcium carbonate antacid) .... As needed 7)  Adult Aspirin Ec Low Strength 81 Mg Tbec (Aspirin) .... Take 1 tablet by mouth once a day 8)  Levsin/sl 0.125 Mg Subl (Hyoscyamine sulfate) .... 2 tablets by mouth sublingually four times a day as needed 9)  Cozaar 50 Mg Tabs (Losartan potassium) .Marland Kitchen.. 1 by mouth once daily 10)  Fosamax 70 Mg Tabs (Alendronate sodium) .... One by mouth weekly 11)  Furosemide 20 Mg Tabs (Furosemide) .... Take one tablet once daily as needed 12)  Tylenol With Codeine #3 300-30 Mg Tabs (Acetaminophen-codeine) .... Take one tablet every 8 hours as needed for migraine Prescriptions: TYLENOL WITH CODEINE #3 300-30 MG TABS (ACETAMINOPHEN-CODEINE) take one tablet every 8 hours as needed for migraine  #90 x 0   Entered and Authorized by:   Nolon Rod. Shawnika Pepin MD   Signed by:   Nolon Rod. Amiliana Foutz MD on 10/06/2009   Method used:   Print then Give to Patient   RxID:   [id number]   Laboratory Results   Urine Tests    Routine Urinalysis   Color: yellow Appearance: Clear Glucose: negative   (Normal Range: Negative) Bilirubin: negative   (Normal Range: Negative) Ketone: negative   (Normal Range: Negative) Spec. Gravity: 1.010   (Normal Range: 1.003-1.035) Blood: negative   (Normal Range: Negative) pH: 6.0   (Normal Range:  5.0-8.0) Protein: trace   (Normal Range: Negative) Urobilinogen: 0.2   (Normal Range: 0-1) Nitrite: negative   (Normal Range: Negative) Leukocyte Esterace: negative   (Normal Range: Negative)

## 2010-06-15 NOTE — Progress Notes (Signed)
Summary: lab results  Phone Note Outgoing Call Call back at [facility] Phone [phone]   Summary of Call: LAB RESULTS: labs ok  patient aware [name]  June 17, 2008 3:18 PM

## 2010-06-15 NOTE — Letter (Signed)
Summary: Results Follow up Letter  [name]  [address] [place], [place] [address]   Phone: [phone]  Fax: [phone]    01/27/2007 MRN: [id number]  [name] [address] [place], [place]  [address]  Dear [name],  The following are the results of your recent test(s):  Test         Result    Pap Smear:        Normal _____  Not Normal _____ Comments: ______________________________________________________ Cholesterol: LDL(Bad cholesterol):         Your goal is less than:         HDL (Good cholesterol):       Your goal is more than: Comments:  ______________________________________________________ Mammogram:        Normal __x___  Not Normal _____ Comments: next mammogram due in 1 year  ___________________________________________________________________ Hemoccult:        Normal _____  Not normal _______ Comments:    _____________________________________________________________________ Other Tests:    We routinely do not discuss normal results over the telephone.  If you desire a copy of the results, or you have any questions about this information we can discuss them at your next office visit.   Sincerely,

## 2010-06-15 NOTE — Miscellaneous (Signed)
Summary: Orders/[facility]  Orders/[facility]   Imported By: Maryln Gottron 11/07/2007 13:15:48  _____________________________________________________________________  External Attachment:    Type:   Image     Comment:   External Document

## 2010-06-15 NOTE — Letter (Signed)
Summary: [facility]   Imported By: Lanelle Bal 10/20/2008 09:56:24  _____________________________________________________________________  External Attachment:    Type:   Image     Comment:   External Document

## 2010-06-15 NOTE — Miscellaneous (Signed)
Summary: BONE DENSITY  Clinical Lists Changes  Orders: Added new Test order of T-Bone Densitometry (77080) - Signed Added new Test order of T-Lumbar Vertebral Assessment (77082) - Signed 

## 2010-06-15 NOTE — Assessment & Plan Note (Signed)
Summary: yearly//PH   Vital Signs:  Patient Profile:   77 Years Old Female Height:     66.5 inches Weight:      185.8 pounds BMI:     29.65 O2 Sat:      97 % O2 treatment:    Room Air Pulse rate:   60 / minute BP sitting:   160 / 90  (left arm) Cuff size:   regular  Vitals Entered By: Shary Decamp (June 25, 2007 12:54 PM)                 Chief Complaint:  yearly; fasting; wants to discuss getting b12 injections; c/o of fatigue; pt states she was dx with OSA about 3 or 4 years ago (dr. Aleen Campi); pt d/c'd ASA because her toes were itching  +better since stopping ASA.  History of Present Illness: yearly;  fasting  Fatigue a lot x 2 months "lack of energy" see ROS wants to discuss getting B12 injections for fatigue  pt states she was dx with OSA about 3 or 4 years ago (dr. Aleen Campi) never was Rx a CPAP   pt d/c'd ASA because her toes were itching  +better since stopping ASA. toes no red , no rash, no lip swelling  was seen here 2-08 w/  syncope saw neuro, they did the MRI and Rx topamax for Dx of seizure Wonders about the MRI report Topamax is expensive: do I really need it??  sees cardiology every 6 months  BP ambulatory 120s    Updated Prior Medication List: PRILOSEC 20 MG  CPDR (OMEPRAZOLE) two p.o. everyday in the morning METOPROLOL TARTRATE 25 MG  TABS (METOPROLOL TARTRATE) 1y mouth two times a day ASPIR-LOW 81 MG  TBEC (ASPIRIN)  * VITAMIN E  * B6 -- B12  * VITAMIN D  LEVSIN 0.125 MG  TABS (HYOSCYAMINE SULFATE) one or two p.o. four times a day and p.r.n. dyspepsia TOPAMAX 25 MG  TABS (TOPIRAMATE) 1 by mouth qhs  Current Allergies (reviewed today): ! PCN ! ASA ! ACCUPRIL ! * TOPROL ! LESCOL ! ZOCOR ! LIPITOR ! * ZETIA ! * CRESTOR  Past Medical History:    Headache - migraines    Hypertension    varicose veins    Osteoporosis    Hyperlipidemia    Diverticulosis, colon    GERD  (HH)    Coronary artery disease    Cerebrovascular  accident, hx of    h/o R leg DVT after a venous ablation  Past Surgical History:    Appendectomy    left l ovarian cyst    3/07 - stress test neg   Family History:    n. c.  Social History:    Married    Former Smoker    Alcohol use-yes    children x 3    Risk Factors:     Comments:  social   Review of Systems  CV      Denies chest pain or discomfort and shortness of breath with exertion.  Resp      Denies cough.      no DOE  GI      Denies bloody stools, nausea, and vomiting.  GU      Denies dysuria, hematuria, urinary frequency, and urinary hesitancy.  Neuro      Denies tremors.      migraines some time has fall occasionally  Psych      Denies anxiety and depression.  stress "OK"    Physical Exam  General:     alert, well-developed, and well-nourished.   Neck:     normal carotid upstroke.   Lungs:     normal respiratory effort, no intercostal retractions, no accessory muscle use, and normal breath sounds.   Heart:     normal rate, regular rhythm, and no murmur.   Abdomen:     soft, non-tender, normal bowel sounds, no hepatomegaly, and no splenomegaly.   Extremities:     no edema Psych:     Cognition and judgment appear intact. Alert and cooperative with normal attention span and concentration. No apparent delusions, illusions, hallucinations    Impression & Recommendations:  Problem # 1:  HEALTH SCREENING (ICD-V70.0) DEXA 8-07: osteoporosis Cscope 2003: polyps  Dr Russella Dar, next 06-2008 Td 6-07 Pneumonia shot has declined before Gyn at [facility] : Dr Vear Clock --MMG PAPs done there shingles shot discussed  Problem # 2:  CORONARY ARTERY DISEASE (ICD-414.00) sees Dr Aleen Campi The following medications were removed from the medication list:    Aspir-low 81 Mg Tbec (Aspirin)  Her updated medication list for this problem includes:    Metoprolol Tartrate 25 Mg Tabs (Metoprolol tartrate) .Marland Kitchen... 1y mouth two times a day   Problem # 3:  FATIGUE  (ICD-780.79) ROS doesn't point to a particular etiology labs was rec. to redo sleep study by cards but is reluctant todo so ?deconditioning---PT referal?? Orders: TLB-TSH (Thyroid Stimulating Hormone) (84443-TSH) TLB-ALT (SGPT) (84460-ALT) TLB-AST (SGOT) (84450-SGOT) TLB-B12 + Folate Pnl (16109_60454-U98/JXB) Venipuncture (14782)   Problem # 4:  HYPERLIPIDEMIA (ICD-272.4) multiple meds intolerances Orders: TLB-Lipid Panel (80061-LIPID)   Problem # 5:  allergic to ASA multiple med allergies rec re try, d/c if sx resurface  Problem # 6:  HYPERTENSION (ICD-401.9) well control? good amb BPs nochange Her updated medication list for this problem includes:    Metoprolol Tartrate 25 Mg Tabs (Metoprolol tartrate) .Marland Kitchen... 1y mouth two times a day  BP today: 160/90 Prior BP: 120/80 (12/30/2006)  Orders: TLB-BMP (Basic Metabolic Panel-BMET) (80048-METABOL) TLB-CBC Platelet - w/Differential (85025-CBCD)   Problem # 7:  SYNCOPE, HX OF (ICD-V12.49) reports from neurology, obtained. The patient is on neurology on March 18, 2007 date of the A. MRI and MRA of the brain, which study was nonacute.  She did of small vessels change consistent with atherosclerosis. The EEG was suspicious for seizure. Eventually after this she was prescribed Topamax. At this point I recommend her to continuing Topamax, and schedule a non-urgent visit with neurology again.   Complete Medication List: 1)  Prilosec 20 Mg Cpdr (Omeprazole) .... Two p.o. everyday in the morning 2)  Metoprolol Tartrate 25 Mg Tabs (Metoprolol tartrate) .Marland Kitchen.. 1y mouth two times a day 3)  Vitamin E  4)  B6 -- B12  5)  Vitamin D  6)  Levsin 0.125 Mg Tabs (Hyoscyamine sulfate) .... One or two p.o. four times a day and p.r.n. dyspepsia 7)  Topamax 25 Mg Tabs (Topiramate) .Marland Kitchen.. 1 by mouth qhs   Patient Instructions: 1)  Please schedule a follow-up appointment in 6 months.    ]  Pneumovax Immunization History:    Pneumovax #  1:  declined (06/25/2007)

## 2010-06-15 NOTE — Progress Notes (Signed)
Summary: REFILL REQUEST  Phone Note Call from Patient Call back at Home Phone [phone]   Caller: Patient Call For: STACIA Details for Reason: REFILL REQUEST Summary of Call: MIGRAINE X 3 DAYS IN A ROW. PATIENT WAS PRESCRIBED MED IN REHAB, STARTS WITH F AND WOULD LIKE TO KNOW IF SHE COULD HAVE RX CALLED IN FOR HER MIGRAINES, PATIENT SAID THE NAME OF MEDICATION SHOULD BE IN HER RECORDS FROM REHAB. I REVIEWED CHART-NO RECORDS, WILL FORWARD TO [place] TO FUTHER ADDRESS.[facility] Initial call taken by: Shonna Chock,  October 14, 2007 10:55 AM  Follow-up for Phone Call        patient was given fioricet...Marland KitchenMarland KitchenMarland Kitchenok to prescription?? .................Marland KitchenShary Decamp  October 14, 2007 12:08 PM ok fioricet 1 by mouth qid as needed HAs call if sx no better or if worse #80, no RF....Marland KitchenMarland KitchenJose E. Claudy Abdallah MD  October 14, 2007 1:25 PM     New/Updated Medications: FIORICET 50-325-40 MG  TABS (BUTALBITAL-APAP-CAFFEINE) 1 by mouth qid as needed headache   Prescriptions: FIORICET 50-325-40 MG  TABS (BUTALBITAL-APAP-CAFFEINE) 1 by mouth qid as needed headache  #80 x 0   Entered by:   Shary Decamp   Authorized by:   Nolon Rod. Djuna Frechette MD   Signed by:   Shary Decamp on 10/14/2007   Method used:   Faxed to ...       [facility] [address] [id number]*       [address]       [place], [place]  [address]       Ph: [phone]       Fax: [phone]   RxID:   [id number]

## 2010-06-15 NOTE — Consult Note (Signed)
Summary: ptosis, labs to r/o myasthenia, Rx MRI MRA----neurology  [facility]   Imported By: Lanelle Bal 04/25/2010 12:11:30  _____________________________________________________________________  External Attachment:    Type:   Image     Comment:   External Document

## 2010-06-15 NOTE — Assessment & Plan Note (Signed)
Summary: LLQ pain/swh   Vital Signs:  Patient Profile:   77 Years Old Female Height:     66.5 inches Weight:      178.8 pounds Temp:     97.1 degrees F oral BP sitting:   134 / 80  (left arm) Cuff size:   regular  Vitals Entered By: Shary Decamp (June 16, 2008 10:14 AM)                 Chief Complaint:  LLQ pain x several weeks; constipation; needs rf on Levsin.  History of Present Illness: LLQ pain x several weeks; crampy, on-off, no change w/ [email or web address] or BMs levsin helped before  CHART REVIEWED , see a/p    Current Allergies (reviewed today): ! PCN ! ASA ! ACCUPRIL ! * TOPROL ! LESCOL ! ZOCOR ! LIPITOR ! * ZETIA ! * CRESTOR ! MILK OF MAGNESIA (MAGNESIUM HYDROXIDE) ! DIOVAN  Past Medical History:    Reviewed history from 04/13/2008 and no changes required:       Headache - migraines       Hypertension       varicose veins       Osteoporosis       Hyperlipidemia       Diverticulosis, colon       GERD  (HH)       Coronary artery disease       Cerebrovascular accident, hx of       h/o R leg DVT after a venous ablation       4-09  L2 fracture status post vertebroplasty of L2 September 10, 2007 performed   by interventional radiology.        4-09 Depression/anxiety -lost husband   Past Surgical History:    Reviewed history from 06/25/2007 and no changes required:       Appendectomy       left l ovarian cyst       3/07 - stress test neg   Family History:    colon polyps--cousin    colon ca-- cousin    Review of Systems  General      Denies fever.  GI      Denies bloody stools, diarrhea, nausea, and vomiting.      no mucus in the stools  no inguinal mass   GU      Denies dysuria and hematuria.   Physical Exam  General:     alert and well-developed.   Lungs:     normal respiratory effort, no intercostal retractions, no accessory muscle use, and normal breath sounds.   Heart:     normal rate, regular rhythm, and no murmur.     Abdomen:     soft, non-tender, no distention, no masses, no guarding, no rigidity, no rebound tenderness, no abdominal hernia, no inguinal hernia, no hepatomegaly, and no splenomegaly.   Extremities:     no pretibial edema bilaterally     Impression & Recommendations:  Problem # 1:  ABDOMINAL TENDERNESS, LEFT LOWER QUADRANT (HQI-696.29) Assessment: Deteriorated chart is reviewed Cscope 2003 Dr Baker Pierini: tics, hemorrhoids LLQ pain 2005, CT (-) LLQ and dyspepsia 8-08, treated conservatively  patient reports gynecological eval aprox 11-09 at [facility]   recurrent LLQ:  labs, levsin, re-refer to Dr Russella Dar (?re-scope) patient will call if symptoms severe  Orders: Venipuncture (52841) TLB-BMP (Basic Metabolic Panel-BMET) (80048-METABOL) TLB-CBC Platelet - w/Differential (85025-CBCD) UA Dipstick w/o Micro (manual) (32440) Gastroenterology Referral (GI)   Problem # 2:  HEALTH  SCREENING (ICD-V70.0) patient reports gynecological eval aprox. 11-09 at [facility]   Complete Medication List: 1)  Metoprolol Tartrate 50 Mg Tabs (Metoprolol tartrate) .... Take one tablet two times a day 2)  Omeprazole 40 Mg Cpdr (Omeprazole) .... Take two tablets daily before meals 3)  Ativan 0.5 Mg Tabs (Lorazepam) .... Three times a day as needed 4)  Hydrocodone-acetaminophen 5-325 Mg Tabs (Hydrocodone-acetaminophen) .... 2 by mouth every 4 hours prn 5)  Tens Unit  .... Dx 805.8 6)  Fioricet 50-325-40 Mg Tabs (Butalbital-apap-caffeine) .Marland Kitchen.. 1 by mouth qid as needed headache 7)  Ra Vitamin A & D 5000-400 Unit Caps (Vitamins a & d) .... Take one tablet daily 8)  Stool Softener 100 Mg Caps (Docusate sodium) .... Take one tablet daily 9)  Levsin 0.125 Mg Tabs (Hyoscyamine sulfate) .Marland Kitchen.. 1 opr 2 by mouth qid as needed abdominal pain 10)  Tums  11)  Asa     Prescriptions: LEVSIN 0.125 MG TABS (HYOSCYAMINE SULFATE) 1 opr 2 by mouth qid as needed abdominal pain  #40 x 0   Entered and Authorized by:   Nolon Rod. Milind Raether MD    Signed by:   Nolon Rod. Ladawna Walgren MD on 06/16/2008   Method used:   Print then Give to Patient   RxID:   [id number]   Laboratory Results   Urine Tests    Routine Urinalysis   Glucose: negative   (Normal Range: Negative) Bilirubin: negative   (Normal Range: Negative) Ketone: negative   (Normal Range: Negative) Spec. Gravity: <1.005   (Normal Range: 1.003-1.035) Blood: negative   (Normal Range: Negative) pH: 6.0   (Normal Range: 5.0-8.0) Protein: negative   (Normal Range: Negative) Urobilinogen: 0.2   (Normal Range: 0-1) Nitrite: negative   (Normal Range: Negative) Leukocyte Esterace: negative   (Normal Range: Negative)

## 2010-06-15 NOTE — Progress Notes (Signed)
  Phone Note Outgoing Call   Call placed by: jat Call placed to: daughter Summary of Call: and the patient's daughter Mrs. Hunter called phone number [phone], about her mother [name].  The daughter stated they took her to behavior health on Friday for admission because of anxiety, depression, and panic attacks.  Behavior health would not omit her because she had back pain.  They then took her to the emergency room on Saturday.  She had an evaluation.  There was given Xanax and told to go home.  The daughter called on Sunday stating her mother would not take the Xanax.  She was having panic attacks every hour.  She and her sister could no longer manage her at home.  I recommended she go to the emergency room and admitted.  I called the triage nurse explained to her the situation and asked her to call the admitting team when the family came in.  Three hours later, the daughter called said she was still in emergently mother waiting for me to come and admit her.  I explained the daughter.  We had admitting team and that the nurse there in the emergency room needs to call the admitting team.

## 2010-06-15 NOTE — Consult Note (Signed)
[facility]: [facility]   Imported By: Freddy Jaksch 06/26/2007 11:12:48  _____________________________________________________________________  External Attachment:    Type:   Image     Comment:   External Document

## 2010-06-15 NOTE — Assessment & Plan Note (Signed)
Summary: HomeHealth certification      Current Allergies: ! PCN ! ASA ! ACCUPRIL ! * TOPROL ! LESCOL ! ZOCOR ! LIPITOR ! * ZETIA ! * CRESTOR        Complete Medication List: 1)  Protonix 40 Mg Tbec (Pantoprazole sodium) .Marland Kitchen.. 1 by mouth qd 2)  Metoprolol Tartrate 25 Mg Tabs (Metoprolol tartrate) .Marland Kitchen.. 1y mouth two times a day 3)  Ativan 0.5 Mg Tabs (Lorazepam) .... Three times a day as needed 4)  Hydrocodone-acetaminophen 5-325 Mg Tabs (Hydrocodone-acetaminophen) .... 2 by mouth every 4 hours prn 5)  Tens Unit  .... Dx 805.8 6)  Fioricet 50-325-40 Mg Tabs (Butalbital-apap-caffeine) .Marland Kitchen.. 1 by mouth qid as needed headache    ]

## 2010-06-16 ENCOUNTER — Ambulatory Visit (HOSPITAL_COMMUNITY)
Admission: RE | Admit: 2010-06-16 | Discharge: 2010-06-16 | Disposition: A | Discharge status: Home / Self Care | Payer: Medicare Other | Source: Ambulatory Visit | Attending: Orthopaedic Surgery | Admitting: Orthopaedic Surgery

## 2010-06-16 ENCOUNTER — Other Ambulatory Visit (HOSPITAL_COMMUNITY): Payer: Self-pay | Admitting: Orthopaedic Surgery

## 2010-06-16 DIAGNOSIS — M1712 Unilateral primary osteoarthritis, left knee: Secondary | ICD-10-CM

## 2010-06-16 DIAGNOSIS — I10 Essential (primary) hypertension: Secondary | ICD-10-CM | POA: Insufficient documentation

## 2010-06-16 DIAGNOSIS — Z01818 Encounter for other preprocedural examination: Secondary | ICD-10-CM | POA: Insufficient documentation

## 2010-06-16 DIAGNOSIS — K449 Diaphragmatic hernia without obstruction or gangrene: Secondary | ICD-10-CM | POA: Insufficient documentation

## 2010-06-16 DIAGNOSIS — R0602 Shortness of breath: Secondary | ICD-10-CM | POA: Insufficient documentation

## 2010-06-16 LAB — PROTIME-INR
INR: 1 (ref 0.00–1.49)
Prothrombin Time: 13.4 seconds (ref 11.6–15.2)

## 2010-06-16 LAB — CBC
HCT: 56 % — ABNORMAL HIGH (ref 36.0–46.0)
Hemoglobin: 18 g/dL — ABNORMAL HIGH (ref 12.0–15.0)
MCH: 27.4 pg (ref 26.0–34.0)
MCHC: 32.1 g/dL (ref 30.0–36.0)
MCV: 85.1 fL (ref 78.0–100.0)
Platelets: 528 10*3/uL — ABNORMAL HIGH (ref 150–400)
RBC: 6.58 MIL/uL — ABNORMAL HIGH (ref 3.87–5.11)
RDW: 15.4 % (ref 11.5–15.5)
WBC: 7.8 10*3/uL (ref 4.0–10.5)

## 2010-06-16 LAB — BASIC METABOLIC PANEL
BUN: 6 mg/dL (ref 6–23)
CO2: 29 mEq/L (ref 19–32)
Calcium: 10 mg/dL (ref 8.4–10.5)
Chloride: 103 mEq/L (ref 96–112)
Creatinine, Ser: 0.74 mg/dL (ref 0.4–1.2)
GFR calc Af Amer: >60 mL/min (ref >60)
GFR calc non Af Amer: >60 mL/min (ref >60)
Glucose, Bld: 102 mg/dL — ABNORMAL HIGH (ref 70–99)
Potassium: 4.9 mEq/L (ref 3.5–5.1)
Sodium: 141 mEq/L (ref 135–145)

## 2010-06-16 LAB — APTT: aPTT: 37 seconds (ref 24–37)

## 2010-06-19 ENCOUNTER — Ambulatory Visit (HOSPITAL_BASED_OUTPATIENT_CLINIC_OR_DEPARTMENT_OTHER): Payer: Medicare Other

## 2010-06-19 ENCOUNTER — Encounter: Payer: Self-pay | Admitting: Cardiology

## 2010-06-19 DIAGNOSIS — R0602 Shortness of breath: Secondary | ICD-10-CM

## 2010-06-19 DIAGNOSIS — Z01810 Encounter for preprocedural cardiovascular examination: Secondary | ICD-10-CM | POA: Insufficient documentation

## 2010-06-19 DIAGNOSIS — R079 Chest pain, unspecified: Secondary | ICD-10-CM

## 2010-06-20 ENCOUNTER — Ambulatory Visit (HOSPITAL_COMMUNITY): Payer: Medicare Other

## 2010-06-20 ENCOUNTER — Inpatient Hospital Stay (HOSPITAL_COMMUNITY)
Admission: RE | Admit: 2010-06-20 | Discharge: 2010-06-23 | DRG: 470 | Disposition: A | Discharge status: Skilled Nursing Facility | Payer: Medicare Other | Source: Ambulatory Visit | Attending: Orthopaedic Surgery | Admitting: Orthopaedic Surgery

## 2010-06-20 ENCOUNTER — Other Ambulatory Visit (HOSPITAL_COMMUNITY): Payer: Self-pay | Admitting: Orthopaedic Surgery

## 2010-06-20 DIAGNOSIS — K219 Gastro-esophageal reflux disease without esophagitis: Secondary | ICD-10-CM | POA: Diagnosis present

## 2010-06-20 DIAGNOSIS — Z7901 Long term (current) use of anticoagulants: Secondary | ICD-10-CM

## 2010-06-20 DIAGNOSIS — M1712 Unilateral primary osteoarthritis, left knee: Secondary | ICD-10-CM

## 2010-06-20 DIAGNOSIS — Z01812 Encounter for preprocedural laboratory examination: Secondary | ICD-10-CM

## 2010-06-20 DIAGNOSIS — Z01810 Encounter for preprocedural cardiovascular examination: Secondary | ICD-10-CM

## 2010-06-20 DIAGNOSIS — I428 Other cardiomyopathies: Secondary | ICD-10-CM | POA: Diagnosis present

## 2010-06-20 DIAGNOSIS — I10 Essential (primary) hypertension: Secondary | ICD-10-CM | POA: Diagnosis present

## 2010-06-20 DIAGNOSIS — M1710 Unilateral primary osteoarthritis, unspecified knee: Principal | ICD-10-CM | POA: Diagnosis present

## 2010-06-20 DIAGNOSIS — Z85828 Personal history of other malignant neoplasm of skin: Secondary | ICD-10-CM

## 2010-06-20 DIAGNOSIS — Z87891 Personal history of nicotine dependence: Secondary | ICD-10-CM

## 2010-06-20 DIAGNOSIS — Z880 Allergy status to penicillin: Secondary | ICD-10-CM

## 2010-06-20 DIAGNOSIS — Z8673 Personal history of transient ischemic attack (TIA), and cerebral infarction without residual deficits: Secondary | ICD-10-CM

## 2010-06-20 LAB — CBC
HCT: 52.5 % — ABNORMAL HIGH (ref 36.0–46.0)
Hemoglobin: 17.1 g/dL — ABNORMAL HIGH (ref 12.0–15.0)
MCH: 27.4 pg (ref 26.0–34.0)
MCHC: 32.6 g/dL (ref 30.0–36.0)
MCV: 84.1 fL (ref 78.0–100.0)
Platelets: 459 10*3/uL — ABNORMAL HIGH (ref 150–400)
RBC: 6.24 MIL/uL — ABNORMAL HIGH (ref 3.87–5.11)
RDW: 15.3 % (ref 11.5–15.5)
WBC: 6.5 10*3/uL (ref 4.0–10.5)

## 2010-06-20 LAB — SURGICAL PCR SCREEN
MRSA, PCR: NEGATIVE
Staphylococcus aureus: NEGATIVE

## 2010-06-21 LAB — BASIC METABOLIC PANEL
BUN: 5 mg/dL — ABNORMAL LOW (ref 6–23)
CO2: 31 mEq/L (ref 19–32)
Calcium: 9.1 mg/dL (ref 8.4–10.5)
Chloride: 101 mEq/L (ref 96–112)
Creatinine, Ser: 0.66 mg/dL (ref 0.4–1.2)
GFR calc Af Amer: >60 mL/min (ref >60)
GFR calc non Af Amer: >60 mL/min (ref >60)
Glucose, Bld: 130 mg/dL — ABNORMAL HIGH (ref 70–99)
Potassium: 4.7 mEq/L (ref 3.5–5.1)
Sodium: 138 mEq/L (ref 135–145)

## 2010-06-21 LAB — CBC
HCT: 45.1 % (ref 36.0–46.0)
Hemoglobin: 14.2 g/dL (ref 12.0–15.0)
MCH: 27.2 pg (ref 26.0–34.0)
MCHC: 31.5 g/dL (ref 30.0–36.0)
MCV: 86.2 fL (ref 78.0–100.0)
Platelets: 427 10*3/uL — ABNORMAL HIGH (ref 150–400)
RBC: 5.23 MIL/uL — ABNORMAL HIGH (ref 3.87–5.11)
RDW: 15.6 % — ABNORMAL HIGH (ref 11.5–15.5)
WBC: 9.8 10*3/uL (ref 4.0–10.5)

## 2010-06-21 LAB — PROTIME-INR
INR: 1.2 (ref 0.00–1.49)
Prothrombin Time: 15.4 seconds — ABNORMAL HIGH (ref 11.6–15.2)

## 2010-06-21 NOTE — Progress Notes (Signed)
Summary: Nuc Pre-Procedure  Phone Note Outgoing Call Call back at [facility] Phone [phone]   Call placed by: Antionette Char RN,  June 15, 2010 1:13 PM Call placed to: Patient Reason for Call: Confirm/change Appt Summary of Call: Reviewed information on Myoview Information Sheet (see scanned document for further details).  Spoke with patient.     Nuclear Med Background Indications for Stress Test: Evaluation for Ischemia, Surgical Clearance  Indications Comments: Pre-op L knee surgery  Dr. Yisroel Ramming  History: Echo, Heart Catheterization  History Comments: Hx DVT Cardiomyopathy 2003 Heart Cath- mild non-obstructive Dz EF-40-50% 1/12 Echo EF 60-65%  Symptoms: SOB, Syncope    Nuclear Pre-Procedure Cardiac Risk Factors: CVA, History of Smoking, Hypertension, Lipids Height (in): 66

## 2010-06-21 NOTE — Assessment & Plan Note (Signed)
Summary:     Visit Type:  Surgical clearance- Left knee Primary Provider:  Willow Ora, MD   History of Present Illness: Primary Cardiologist:  Dr. Marca Ancona  [name] is a 77 yo female with a  history of HTN, prior stroke, and prior cardiomyopathy previously seen by Dr. Aleen Campi in the past.  She establised with Dr Shirlee Latch last year.  Patient has an interesting cardiac history.  She had an episode of severe chest pain in 1994 and had a heart catheterization at that time that per her report showed no heart artery blockages.  In 2003, she had another episode of sudden onset chest and back pain.  She does not remember what triggered the episode.  She had extensive T wave inversions on ECG.  Left heart cath showed nonobstructive CAD but left ventriculogram showed periapical severe hypokinesis with basal hyperkinesis.  At the time, it was thought that she had had a large MI from LAD occlusion with spontaneous recanalization of the vessel.  However, it is suspected that she probably had a stress (Takotsubo) cardiomyopathy as her ECG and left heart cath fit this picture fairly well.    Most recent echo in 2009 showed normal LV systolic function and wall motion.  Dr. Shirlee Latch recommended a follow up echo in 07/2009 to reassess her LVF but this was never done.  She returns today for surgical clearance.  She is significantly limited by her left knee arthritis.  She needs a left total knee replacement.  This is scheduled for next week with Dr. Yisroel Ramming.  She denies chest pain.  She did have some back discomfort a couple of weeks ago.  Of note, she did have significant back discomfort when she first presented in 1994 as well as in 2003.  As noted, she is not that functional.  She feels as though she could vacuum a room without chest discomfort or shortness of breath.  She has awoken recently short of breath.  She attributes this to her sleep apnea and this is how she was diagnosed some years ago.  She denies  orthopnea.  She denies significant pedal edema.  She denies syncope.   Current Medications (verified): 1)  Metoprolol Tartrate 50 Mg Tabs (Metoprolol Tartrate) .... Take One Tablet Two Times A Day 2)  Omeprazole 40 Mg Cpdr (Omeprazole) .... Take One Capsule Daily 3)  Ativan 0.5 Mg  Tabs (Lorazepam) .... Three Times A Day As Needed 4)  Stool Softener 100 Mg Caps (Docusate Sodium) .... Take One Tablet Daily 5)  Tums Ultra 1000 1000 Mg Chew (Calcium Carbonate Antacid) .... As Needed 6)  Adult Aspirin Ec Low Strength 81 Mg Tbec (Aspirin) .... Take 1 Tablet By Mouth Once A Day 7)  Furosemide 20 Mg Tabs (Furosemide) .... Take One Tablet Once Daily As Needed 8)  Tylenol With Codeine #3 300-30 Mg Tabs (Acetaminophen-Codeine) .... Take One Tablet Every 8 Hours As Needed For Migraine 9)  Vitamin D 1000 Unit Tabs (Cholecalciferol) .... Take 1 Tab Once Daily 10)  Vitamin E 400 Unit Caps (Vitamin E) .... Once A Day  Allergies: 1)  ! Pcn 2)  ! Asa 3)  ! Accupril 4)  ! * Toprol 5)  ! Lescol 6)  ! Zocor 7)  ! Lipitor 8)  ! * Zetia 9)  ! * Crestor 10)  ! Milk of Magnesia (Magnesium Hydroxide) 11)  ! Diovan  Past History:  Past Medical History: Last updated: 10/19/2009 1. Headache - migraines 2. Hypertension 3.  Hyperlipidemia 4. Osteoporosis 5. Coronary artery disease: Mild nonobstructive disease on cath in 2003.  6  CVA 7. GI issues: GERD Hiatal hernia, Hemorrhoids, Hyperplastic colon polyp, 06/2001, Functional LLQ pain, h/o diverticuli 8. Obstructive sleep apnea 9. varicose veins 10. h/o R leg DVT after a venous ablation 11. OA with bilateral knee pain 12. 4-09  L2 fracture status post vertebroplasty of L2 September 10, 2007 performed  by IR 13.  Depression/anxiety -lost husband  49.  Probable Takotsubo cardiomyopathy: Severe chest pain with normal cath in 1994.  Severe chest pain in 2003 with widespread T wave inversions on ECG.  Cath with minimal coronary disease but LV-gram showed  periapical severe hypokinesis and basilar hyperkinesia (EF 40%).  Last echo in 4/09 confirmed full LV functional recovery with EF 60%, no regional wall motion abnormalities, mild to moderate LVH.   Social History: Reviewed history from 04/24/2010 and no changes required. Married, lost husband 07-29-07.  Lives in [place].  lives in a town house, her daughter moved in w/ her  driving  Former Smoker: quit 1990 Alcohol use-yes children x 3 ( 2 in [place] , see them occasionaly) Daily Caffeine Use coffee  Review of Systems       As per  the HPI.  All other systems reviewed and negative.   Vital Signs:  Patient profile:   77 year old female Height:      66 inches Weight:      176 pounds BMI:     28.51 O2 Sat:      18 % Pulse rate:   61 / minute Pulse rhythm:   regular BP sitting:   140 / 74  (left arm) Cuff size:   large  Vitals Entered By: Vikki Ports (June 13, 2010 11:46 AM)  Physical Exam  General:  Well nourished, well developed, in no acute distress HEENT: normal Neck: no JVD Cardiac:  normal S1, S2; RRR; no murmur Lungs:  clear to auscultation bilaterally, no wheezing, rhonchi or rales Abd: soft, nontender, no hepatomegaly Ext: no edema Vascular: no carotid  bruits Skin: warm and dry Neuro:  CNs 2-12 intact, no focal abnormalities noted    EKG  Procedure date:  06/13/2010  Findings:      normal sinus rhythm Heart rate 61 Normal axis No ischemic changes  Impression & Recommendations:  Problem # 1:  CORONARY ARTERY DISEASE (ICD-414.00) As noted above, she is limited in her function by her arthritis.  She had some back discomfort recently.  Prior to clearing her for surgery I think we should have her come in for a nuclear scan.  She will be scheduled for a Myoview in the next few days.  Orders: EKG w/ Interpretation (93000) Nuclear Stress Test (Nuc Stress Test)  Problem # 2:  CARDIOMYOPATHY (ICD-425.4) She never had her echocardiogram performed last  year.  She'll be brought back in for an echocardiogram to reassess her LV function.  Orders: Echocardiogram (Echo)  Problem # 3:  PRE-OPERATIVE CARDIOVASCULAR EXAMINATION (ICD-V72.81) As noted, we will further evaluate her with a Myoview study and an echocardiogram.  If and when she is cleared for surgery, I have advised her to continue on her beta blocker throughout the perioperative period.  Problem # 4:  HYPERTENSION (ICD-401.9) Borderline control.  Continue current meds.  Patient Instructions: 1)  Your physician has requested that you have an echocardiogram.  Echocardiography is a painless test that uses sound waves to create images of your heart. It provides your doctor with  information about the size and shape of your heart and how well your heart's chambers and valves are working.  This procedure takes approximately one hour. There are no restrictions for this procedure. Pt would like have this done 06/14/10 due to surgery 06/20/10 with Dr. Jerl Santos. 2)  Your physician has requested that you have a Lexiscan myoview.  For further information please visit [email or web address]  Please follow instruction sheet, as given. Pt would like to have this done 06/14/10 due to surgery 06/20/10 with Dr. Jerl Santos. 3)  Your physician recommends that you continue on your current medications as directed. Please refer to the Current Medication list given to you today.

## 2010-06-21 NOTE — Progress Notes (Signed)
Summary: Records Request  Faxed OV, EKG & Echo to [place] [facility] ([phone]). Debby Freiberg  June 14, 2010 4:11 PM

## 2010-06-21 NOTE — Letter (Signed)
Summary: planning a TKR--[facility]   [facility]   Imported By: Lanelle Bal 06/13/2010 08:41:08  _____________________________________________________________________  External Attachment:    Type:   Image     Comment:   External Document

## 2010-06-22 LAB — CBC
HCT: 41.2 % (ref 36.0–46.0)
Hemoglobin: 13.3 g/dL (ref 12.0–15.0)
MCH: 27.4 pg (ref 26.0–34.0)
MCHC: 32.3 g/dL (ref 30.0–36.0)
MCV: 84.9 fL (ref 78.0–100.0)
Platelets: 451 10*3/uL — ABNORMAL HIGH (ref 150–400)
RBC: 4.85 MIL/uL (ref 3.87–5.11)
RDW: 15.5 % (ref 11.5–15.5)
WBC: 10.6 10*3/uL — ABNORMAL HIGH (ref 4.0–10.5)

## 2010-06-22 LAB — BASIC METABOLIC PANEL
BUN: 4 mg/dL — ABNORMAL LOW (ref 6–23)
CO2: 28 mEq/L (ref 19–32)
Calcium: 8.5 mg/dL (ref 8.4–10.5)
Chloride: 100 mEq/L (ref 96–112)
Creatinine, Ser: 0.62 mg/dL (ref 0.4–1.2)
GFR calc Af Amer: >60 mL/min (ref >60)
GFR calc non Af Amer: >60 mL/min (ref >60)
Glucose, Bld: 131 mg/dL — ABNORMAL HIGH (ref 70–99)
Potassium: 3.6 mEq/L (ref 3.5–5.1)
Sodium: 135 mEq/L (ref 135–145)

## 2010-06-22 LAB — PROTIME-INR
INR: 2.08 — ABNORMAL HIGH (ref 0.00–1.49)
Prothrombin Time: 23.5 seconds — ABNORMAL HIGH (ref 11.6–15.2)

## 2010-06-23 LAB — BASIC METABOLIC PANEL
BUN: 3 mg/dL — ABNORMAL LOW (ref 6–23)
CO2: 28 mEq/L (ref 19–32)
Calcium: 8.6 mg/dL (ref 8.4–10.5)
Chloride: 102 mEq/L (ref 96–112)
Creatinine, Ser: 0.59 mg/dL (ref 0.4–1.2)
GFR calc Af Amer: >60 mL/min (ref >60)
GFR calc non Af Amer: >60 mL/min (ref >60)
Glucose, Bld: 116 mg/dL — ABNORMAL HIGH (ref 70–99)
Potassium: 3.8 mEq/L (ref 3.5–5.1)
Sodium: 137 mEq/L (ref 135–145)

## 2010-06-23 LAB — CBC
HCT: 39 % (ref 36.0–46.0)
Hemoglobin: 12.5 g/dL (ref 12.0–15.0)
MCH: 27 pg (ref 26.0–34.0)
MCHC: 32.1 g/dL (ref 30.0–36.0)
MCV: 84.2 fL (ref 78.0–100.0)
Platelets: 466 10*3/uL — ABNORMAL HIGH (ref 150–400)
RBC: 4.63 MIL/uL (ref 3.87–5.11)
RDW: 15.6 % — ABNORMAL HIGH (ref 11.5–15.5)
WBC: 8.7 10*3/uL (ref 4.0–10.5)

## 2010-06-23 LAB — PROTIME-INR
INR: 2.02 — ABNORMAL HIGH (ref 0.00–1.49)
Prothrombin Time: 23 seconds — ABNORMAL HIGH (ref 11.6–15.2)

## 2010-06-25 ENCOUNTER — Emergency Department (HOSPITAL_COMMUNITY)
Admission: EM | Admit: 2010-06-25 | Discharge: 2010-06-26 | Disposition: A | Discharge status: Home / Self Care | Payer: Medicare Other | Attending: Emergency Medicine | Admitting: Emergency Medicine

## 2010-06-25 DIAGNOSIS — K219 Gastro-esophageal reflux disease without esophagitis: Secondary | ICD-10-CM | POA: Insufficient documentation

## 2010-06-25 DIAGNOSIS — Z7901 Long term (current) use of anticoagulants: Secondary | ICD-10-CM | POA: Insufficient documentation

## 2010-06-25 DIAGNOSIS — M25469 Effusion, unspecified knee: Secondary | ICD-10-CM | POA: Insufficient documentation

## 2010-06-25 DIAGNOSIS — M7989 Other specified soft tissue disorders: Secondary | ICD-10-CM | POA: Insufficient documentation

## 2010-06-25 DIAGNOSIS — Z79899 Other long term (current) drug therapy: Secondary | ICD-10-CM | POA: Insufficient documentation

## 2010-06-25 DIAGNOSIS — M25569 Pain in unspecified knee: Secondary | ICD-10-CM | POA: Insufficient documentation

## 2010-06-25 DIAGNOSIS — I10 Essential (primary) hypertension: Secondary | ICD-10-CM | POA: Insufficient documentation

## 2010-06-25 DIAGNOSIS — I252 Old myocardial infarction: Secondary | ICD-10-CM | POA: Insufficient documentation

## 2010-06-25 LAB — CBC
HCT: 39.4 % (ref 36.0–46.0)
Hemoglobin: 12.8 g/dL (ref 12.0–15.0)
MCH: 27.1 pg (ref 26.0–34.0)
MCHC: 32.5 g/dL (ref 30.0–36.0)
MCV: 83.5 fL (ref 78.0–100.0)
Platelets: 592 10*3/uL — ABNORMAL HIGH (ref 150–400)
RBC: 4.72 MIL/uL (ref 3.87–5.11)
RDW: 15.2 % (ref 11.5–15.5)
WBC: 7.4 10*3/uL (ref 4.0–10.5)

## 2010-06-25 LAB — DIFFERENTIAL
Basophils Absolute: 0 10*3/uL (ref 0.0–0.1)
Basophils Relative: 0 % (ref 0–1)
Eosinophils Absolute: 0.2 10*3/uL (ref 0.0–0.7)
Eosinophils Relative: 3 % (ref 0–5)
Lymphocytes Relative: 11 % — ABNORMAL LOW (ref 12–46)
Lymphs Abs: 0.8 10*3/uL (ref 0.7–4.0)
Monocytes Absolute: 1 10*3/uL (ref 0.1–1.0)
Monocytes Relative: 14 % — ABNORMAL HIGH (ref 3–12)
Neutro Abs: 5.4 10*3/uL (ref 1.7–7.7)
Neutrophils Relative %: 72 % (ref 43–77)

## 2010-06-25 LAB — APTT: aPTT: 60 seconds — ABNORMAL HIGH (ref 24–37)

## 2010-06-25 LAB — BASIC METABOLIC PANEL
BUN: 6 mg/dL (ref 6–23)
CO2: 29 mEq/L (ref 19–32)
Calcium: 8.7 mg/dL (ref 8.4–10.5)
Chloride: 97 mEq/L (ref 96–112)
Creatinine, Ser: 0.67 mg/dL (ref 0.4–1.2)
GFR calc Af Amer: >60 mL/min (ref >60)
GFR calc non Af Amer: >60 mL/min (ref >60)
Glucose, Bld: 142 mg/dL — ABNORMAL HIGH (ref 70–99)
Potassium: 3.6 mEq/L (ref 3.5–5.1)
Sodium: 131 mEq/L — ABNORMAL LOW (ref 135–145)

## 2010-06-25 LAB — PROTIME-INR
INR: 2.47 — ABNORMAL HIGH (ref 0.00–1.49)
Prothrombin Time: 26.9 seconds — ABNORMAL HIGH (ref 11.6–15.2)

## 2010-06-28 ENCOUNTER — Encounter: Payer: Self-pay | Admitting: Internal Medicine

## 2010-06-29 NOTE — Assessment & Plan Note (Signed)
Summary: Cardiology Nuclear Testing  Nuclear Med Background Indications for Stress Test: Evaluation for Ischemia, Surgical Clearance  Indications Comments: Pre-op L knee surgery  Dr. Yisroel Ramming  History: Echo, Heart Catheterization  History Comments: Hx DVT Cardiomyopathy 2003 Heart Cath- mild non-obstructive Dz EF-40-50% 1/12 Echo EF 60-65%  Symptoms: Chest Pain, SOB, Syncope    Nuclear Pre-Procedure Cardiac Risk Factors: CVA, History of Smoking, Hypertension, Lipids Caffeine/Decaff Intake: None NPO After: 8:00 PM Lungs: clear IV 0.9% NS with Angio Cath: 20g     IV Site: L Antecubital IV Started by: Irean Hong, RN Chest Size (in) 42     Cup Size C     Height (in): 66 Weight (lb): 174 BMI: 28.19 Tech Comments: Took metoprolol this am.  Nuclear Med Study 1 or 2 day study:  1 day     Stress Test Type:  Eugenie Birks Reading MD:  Willa Rough, MD     Referring MD:  J.Paz Resting Radionuclide:  Technetium 64m Tetrofosmin     Resting Radionuclide Dose:  11 mCi  Stress Radionuclide:  Technetium 50m Tetrofosmin     Stress Radionuclide Dose:  33 mCi   Stress Protocol   Lexiscan: 0.4 mg   Stress Test Technologist:  Milana Na, EMT-P     Nuclear Technologist:  Domenic Polite, CNMT  Rest Procedure  Myocardial perfusion imaging was performed at rest 45 minutes following the intravenous administration of Technetium 46m Tetrofosmin.  Stress Procedure  The patient received IV Lexiscan 0.4 mg over 15-seconds.  Technetium 36m Tetrofosmin injected at 30-seconds.  There were no significant changes with infusion.  Quantitative spect images were obtained after a 45 minute delay.  QPS Raw Data Images:  Normal; no motion artifact; normal heart/lung ratio. Stress Images:  Normal homogeneous uptake in all areas of the myocardium. Rest Images:  Normal homogeneous uptake in all areas of the myocardium. Transient Ischemic Dilatation:  1.05  (Normal <1.22)  Lung/Heart Ratio:  .26  (Normal  <0.45)  Quantitative Gated Spect Images QGS EDV:  67 ml QGS ESV:  16 ml QGS EF:  77 % QGS cine images:  Normal motion  Findings Normal nuclear study      Overall Impression  Exercise Capacity: Lexiscan with no exercise. BP Response: Normal blood pressure response. Clinical Symptoms: Chest pressure ECG Impression: No significant ST segment change suggestive of ischemia. Overall Impression: Normal stress nuclear study.  Appended Document: Cardiology Nuclear Testing stress test ok without ischemia EF ok on recent echo she does not need any further workup prior to her noncardiac surgery she should be at acceptable risk please fax to her surgeon

## 2010-07-02 ENCOUNTER — Emergency Department (HOSPITAL_COMMUNITY): Payer: Medicare Other

## 2010-07-02 ENCOUNTER — Emergency Department (HOSPITAL_COMMUNITY)
Admission: EM | Admit: 2010-07-02 | Discharge: 2010-07-03 | Disposition: A | Discharge status: Home / Self Care | Payer: Medicare Other | Attending: Emergency Medicine | Admitting: Emergency Medicine

## 2010-07-02 DIAGNOSIS — Z79899 Other long term (current) drug therapy: Secondary | ICD-10-CM | POA: Insufficient documentation

## 2010-07-02 DIAGNOSIS — M1990 Unspecified osteoarthritis, unspecified site: Secondary | ICD-10-CM | POA: Insufficient documentation

## 2010-07-02 DIAGNOSIS — Z8673 Personal history of transient ischemic attack (TIA), and cerebral infarction without residual deficits: Secondary | ICD-10-CM | POA: Insufficient documentation

## 2010-07-02 DIAGNOSIS — M25469 Effusion, unspecified knee: Secondary | ICD-10-CM | POA: Insufficient documentation

## 2010-07-02 DIAGNOSIS — M25569 Pain in unspecified knee: Secondary | ICD-10-CM | POA: Insufficient documentation

## 2010-07-02 DIAGNOSIS — G8918 Other acute postprocedural pain: Secondary | ICD-10-CM | POA: Insufficient documentation

## 2010-07-02 DIAGNOSIS — I10 Essential (primary) hypertension: Secondary | ICD-10-CM | POA: Insufficient documentation

## 2010-07-02 LAB — CBC
HCT: 44.4 % (ref 36.0–46.0)
Hemoglobin: 14.2 g/dL (ref 12.0–15.0)
MCH: 26.7 pg (ref 26.0–34.0)
MCHC: 32 g/dL (ref 30.0–36.0)
MCV: 83.5 fL (ref 78.0–100.0)
Platelets: 900 10*3/uL — ABNORMAL HIGH (ref 150–400)
RBC: 5.32 MIL/uL — ABNORMAL HIGH (ref 3.87–5.11)
RDW: 15.6 % — ABNORMAL HIGH (ref 11.5–15.5)
WBC: 12.1 10*3/uL — ABNORMAL HIGH (ref 4.0–10.5)

## 2010-07-02 LAB — DIFFERENTIAL
Basophils Absolute: 0 10*3/uL (ref 0.0–0.1)
Basophils Relative: 0 % (ref 0–1)
Eosinophils Absolute: 0.1 10*3/uL (ref 0.0–0.7)
Eosinophils Relative: 1 % (ref 0–5)
Lymphocytes Relative: 6 % — ABNORMAL LOW (ref 12–46)
Lymphs Abs: 0.7 10*3/uL (ref 0.7–4.0)
Monocytes Absolute: 1 10*3/uL (ref 0.1–1.0)
Monocytes Relative: 8 % (ref 3–12)
Neutro Abs: 10.2 10*3/uL — ABNORMAL HIGH (ref 1.7–7.7)
Neutrophils Relative %: 85 % — ABNORMAL HIGH (ref 43–77)

## 2010-07-02 LAB — POCT I-STAT, CHEM 8
BUN: 8 mg/dL (ref 6–23)
Calcium, Ion: 1.16 mmol/L (ref 1.12–1.32)
Chloride: 99 mEq/L (ref 96–112)
Creatinine, Ser: 0.6 mg/dL (ref 0.4–1.2)
Glucose, Bld: 164 mg/dL — ABNORMAL HIGH (ref 70–99)
HCT: 45 % (ref 36.0–46.0)
Hemoglobin: 15.3 g/dL — ABNORMAL HIGH (ref 12.0–15.0)
Potassium: 3.6 mEq/L (ref 3.5–5.1)
Sodium: 136 mEq/L (ref 135–145)
TCO2: 26 mmol/L (ref 0–100)

## 2010-07-05 NOTE — Discharge Summary (Signed)
NAMECHEVON, [name]             ACCOUNT NO.:  [id number]  MEDICAL RECORD NO.:  [id number]           PATIENT TYPE:  I  LOCATION:  5040                         FACILITY:  [facility]  PHYSICIAN:  Lubertha Basque. Joshus Rogan, M.D.DATE OF BIRTH:  Sep 15, 1929  DATE OF ADMISSION:  06/20/2010 DATE OF DISCHARGE:  06/23/2010                              DISCHARGE SUMMARY   ADMITTING DIAGNOSES: 1. Left knee end-stage degenerative joint disease. 2. History of cerebrovascular accident. 3. History of cardiomyopathy. 4. History of hypertension. 5. History of skin cancer.  DISCHARGE DIAGNOSES: 1. Left knee end-stage degenerative joint disease. 2. History of cerebrovascular accident. 3. History of cardiomyopathy. 4. History of hypertension. 5. History of skin cancer.  OPERATIONS:  Left total knee replacement.  BRIEF HISTORY:  [name] is a 77 year old white female patient, well known to our practice with increasing left knee pain when she has failed antiinflammatory medication by mouth, corticosteroid injections.  Her x- rays revealed end-stage bone-on-bone DJD, medial compartment.  PERTINENT LABORATORY AND X-RAY FINDINGS:  Hemoglobin 13.3, hematocrit 41.4, WBCs 10.6, platelets of 451.  Sodium 135, potassium 3.6, BUN 4, creatinine 0.62, glucose 131.  Last INR 2.08.  COURSE IN THE HOSPITAL:  She was admitted postoperatively, placed on variety of medications including Coumadin and Lovenox for DVT prophylaxis regulated by pharmacy.  I advanced her diet as tolerated. Vancomycin was used perioperatively as an antibiotic, appropriate stool softeners.  Foley catheter initially for the first day and then discontinued.  She was able to void well, had bowel movement before leaving the hospital.  Physical therapy ordered for weightbearing as tolerated.  Also, the use of a CPM machine, knee-high TED, incentive spirometer.  Additional medicines included muscle relaxers and pain medications and then her usual  home medicines which were available at the time of discharge summary in the discharge summary med rec sheet. The first day postop, her drain was leaking around the drain site and her dressing was changed and her drain was pulled.  Her vital signs were stable.  Blood pressure 130/73, temperature 97.  Foley catheter was removed and she was able to void.  Knee was moving 0-60 degrees in the CPM and advanced as tolerated.  No sign of infection.  Second day postop, she continued to improve, was up walking with physical therapy in the hallway, was having pain controlled relatively well by oral pain medications and she was decided that she had no one living with her at home and would be best to go to [facility] on discharge for therapy and a short-term stay.  CONDITION ON DISCHARGE:  Improved.  FOLLOWUP:  She will remain on Coumadin for 14 days postop with an INR range between 2.0-3.0.  She can continue her knee-high TEDs, CPM machine 0-50, and advance as tolerated 10 degrees daily as pain allows 6 hours a day.  Weightbearing as tolerated.  May change her dressing daily.  Any sign of infection to call our office [phone], also call the office at that same number for an appointment for 2 weeks from the surgical date. Her diet is regular.  Crutches or walker also and her home medicines  were unchanged or can be found on the med discharge management sheet.     Lindwood Qua, P.A.   ______________________________ Lubertha Basque. Jerl Santos, M.D.    MC/MEDQ  D:  06/22/2010  T:  06/23/2010  Job:  [id number]  Electronically Signed by Lindwood Qua P.A. on 07/01/2010 10:05:54 AM Electronically Signed by Marcene Corning M.D. on 07/05/2010 02:24:33 PM

## 2010-07-05 NOTE — Op Note (Addendum)
[name], [name]             ACCOUNT NO.:  [id number]  MEDICAL RECORD NO.:  [id number]           PATIENT TYPE:  O  LOCATION:  [id number]                     FACILITY:  [facility]  PHYSICIAN:  Lubertha Basque. Deicy Rusk, M.D.DATE OF BIRTH:  [birth year]  DATE OF PROCEDURE:  06/20/2010 DATE OF DISCHARGE:  06/23/2010                              OPERATIVE REPORT   PREOPERATIVE DIAGNOSIS:  Left knee degenerative joint disease.  POSTOPERATIVE DIAGNOSIS:  Left knee degenerative joint disease.  PROCEDURE:  Left total knee replacement.  ANESTHESIA:  General and block.  ATTENDING SURGEON:  Lubertha Basque. Jerl Santos, MD  ASSISTANT:  Lindwood Qua, PA   INDICATIONS FOR PROCEDURE:  The patient is a 77 year old woman with a long history of a very painful left knee.  This has persisted despite oral medication and injectables.  She has advanced degenerative change and a mild valgus deformity.  She has pain, which limits her ability to walk and rest and she is offered a knee replacement.  Informed operative consent was obtained after discussion of possible complications including reaction to anesthesia, infection, DVT, PE, and death.  The importance of the postoperative rehabilitation protocol to optimize result was stressed extensively with the patient.  SUMMARY OF FINDINGS AND PROCEDURE:  Under general anesthesia and a block, a left knee replacement was performed.  She had advanced degenerative changes and mild valgus deformity.  She had excellent bone quality.  We addressed her problem with a cemented DePuy LCS system using standard plus femur, 10 deep dish insert, 4 tibial tray, and 35 all polyethylene patella.  I did include Zinacef antibiotic in the cement.  [name] assisted throughout and was invaluable to the completion of the case in that he helped position and retract while I performed the procedure.  He also closed simultaneously to help minimize OR time.  DESCRIPTION OF PROCEDURE:   The patient was taken to the operating suite where general anesthetic was applied without difficulty.  She was positioned supine and prepped and draped in normal sterile fashion. After insertion of IV vancomycin, the left leg was elevated, exsanguinated, and tourniquet inflated about the thigh.  A longitudinal anterior incision was made with dissection down to the extensor mechanism.  All appropriate anti-infective measures were used including closed-hooded exhaust systems for each member of the surgical team, Betadine impregnated drape, and the preoperative IV antibiotic.  A medial parapatellar incision was made.  The kneecap was flipped and the knee flexed.  Some residual meniscal tissues were removed along with the ACL and PCL.  An extramedullary guide was placed in the tibia to make a cut with a slight posterior slope.  An intramedullary guide was then placed in the femur to make anterior-posterior cuts creating a flexion gap of 10 mm.  A second intramedullary guide was placed in the femur to make a distal cut creating equal extension gap of 10 mm.  We did perform a mild release off the lateral femur and corrected her mild valgus deformity without much trouble.  The femur sized to a standard plus and the tibia to a 4 and the appropriate guides were placed and utilized. The patella was cut down in  thickness by about 11 mm to 15 of thickness and sized to 35 with appropriate guide placed and utilized.  Trial reduction was done with all these components in the tenth spacer.  She easily came to slight hyperextension and flexed well.  The trial component was removed followed by pulsatile lavage, irrigation of all three cut bony surfaces.  Cement was mixed including Zinacef antibiotic and was pressurized onto the bones followed by placement of the aforementioned DePuy components.  Excess cement was trimmed and pressure was held in the components until the cement had hardened.  The tourniquet  was deflated and a small amount of bleeding was easily controlled with pressure and Bovie cautery.  The knee was again irrigated followed by placement of drain exiting superolaterally.  The extensor mechanism was reapproximated with #1 Vicryl in an interrupted fashion followed by subcutaneous reapproximation with 0 and 2-0 undyed Vicryl and skin closure with staples.  Adaptic was applied followed by dry gauze and a loose Ace wrap.  Estimated blood loss, intraoperative fluids, as well as accurate tourniquet time can be obtained from anesthesia records.  DISPOSITION:  The patient was extubated in the operating room and taken to the recovery room in a stable condition.  She was to be admitted to the Orthopedic Surgery Service for appropriate postop care to include perioperative antibiotics and Coumadin plus Lovenox for DVT prophylaxis.     Lubertha Basque Jerl Santos, M.D.     PGD/MEDQ  D:  06/20/2010  T:  06/21/2010  Job:  [id number]  Electronically Signed by Marcene Corning M.D. on 09/12/2010 02:12:48 PM

## 2010-07-05 NOTE — Letter (Signed)
Summary: plan[facility] Knee [facility]  [facility]   Imported By: Kassie Mends 06/22/2010 11:49:40  _____________________________________________________________________  External Attachment:    Type:   Image     Comment:   External Document

## 2010-07-20 NOTE — Letter (Signed)
Summary: [facility]  [facility]   Imported By: Maryln Gottron 07/11/2010 11:17:39  _____________________________________________________________________  External Attachment:    Type:   Image     Comment:   External Document

## 2010-07-26 ENCOUNTER — Ambulatory Visit: Payer: Self-pay | Admitting: Cardiology

## 2010-07-28 ENCOUNTER — Ambulatory Visit (INDEPENDENT_AMBULATORY_CARE_PROVIDER_SITE_OTHER): Payer: Medicare Other | Admitting: Internal Medicine

## 2010-07-28 ENCOUNTER — Encounter: Payer: Self-pay | Admitting: Internal Medicine

## 2010-07-28 DIAGNOSIS — I428 Other cardiomyopathies: Secondary | ICD-10-CM

## 2010-07-28 DIAGNOSIS — I10 Essential (primary) hypertension: Secondary | ICD-10-CM

## 2010-07-28 DIAGNOSIS — R630 Anorexia: Secondary | ICD-10-CM

## 2010-07-28 DIAGNOSIS — R634 Abnormal weight loss: Secondary | ICD-10-CM

## 2010-07-31 LAB — CONVERTED CEMR LAB
BUN: 6 mg/dL (ref 6–23)
Basophils Absolute: 0 10*3/uL (ref 0.0–0.1)
Basophils Relative: 0 % (ref 0–1)
CO2: 26 meq/L (ref 19–32)
Calcium: 9.6 mg/dL (ref 8.4–10.5)
Chloride: 100 meq/L (ref 96–112)
Creatinine, Ser: 0.64 mg/dL (ref 0.40–1.20)
Eosinophils Absolute: 0.2 10*3/uL (ref 0.0–0.7)
Eosinophils Relative: 2 % (ref 0–5)
Glucose, Bld: 111 mg/dL — ABNORMAL HIGH (ref 70–99)
HCT: 47 % — ABNORMAL HIGH (ref 36.0–46.0)
Hemoglobin: 15.2 g/dL — ABNORMAL HIGH (ref 12.0–15.0)
Lymphocytes Relative: 12 % (ref 12–46)
Lymphs Abs: 1.1 10*3/uL (ref 0.7–4.0)
MCHC: 32.3 g/dL (ref 30.0–36.0)
MCV: 86.4 fL (ref 78.0–100.0)
Monocytes Absolute: 0.9 10*3/uL (ref 0.1–1.0)
Monocytes Relative: 9 % (ref 3–12)
Neutro Abs: 7.3 10*3/uL (ref 1.7–7.7)
Neutrophils Relative %: 77 % (ref 43–77)
Platelets: 548 10*3/uL — ABNORMAL HIGH (ref 150–400)
Potassium: 4.6 meq/L (ref 3.5–5.3)
RBC: 5.44 M/uL — ABNORMAL HIGH (ref 3.87–5.11)
RDW: 16.7 % — ABNORMAL HIGH (ref 11.5–15.5)
Sodium: 137 meq/L (ref 135–145)
TSH: 2.04 microintl units/mL (ref 0.350–4.500)
WBC: 9.5 10*3/uL (ref 4.0–10.5)

## 2010-08-01 NOTE — Assessment & Plan Note (Signed)
Summary: released from rehab--knee replacement, having trouble eating ...   Vital Signs:  Patient profile:   77 year old female Weight:      162.25 pounds Pulse rate:   76 / minute Pulse rhythm:   regular BP sitting:   126 / 82  (left arm) Cuff size:   large  Vitals Entered By: Army Fossa CMA (July 28, 2010 2:08 PM) CC: Had surgery, unable to eat lost about 18 pound in 5 weeks.  Comments "Discuss stomach pill" [facility] [address]    History of Present Illness:  status post a total knee replacement 2- 2012,  she was discharged to rehabilitation and then eventually home few  days ago.  She has a nurse visiting him twice a week.   Chart is reviewed  2-19 hemoglobin was 5.3, WBCs 12.1, platelets 900   2-12 potassium 3.6, creatinine 0.6   her chief complaint today is lack of appetite and weight loss. She denies any discomfort when she eats she is just not hungry. Also, she is still on pain, she was taking OxyContin , currently on vicodin only , thinks that that's enough for now.   as far as her high blood pressure, her blood pressure today here is normal, she reports occasional blood pressure of 90/60, she's not particularly symptomatic when that happens  ROS  No fever or chills No cough or wheezing  no chest pain, palpitations.  she is slightly short of breath "since the surgery" denies orthopnea. mild nausea, no  vomiting, diarrhea. No blood in the stools. Some constipation. No dysphasia or odynophagia Denies depression  She developed post of left leg edema, she reports a negative ultrasound for DVT shortly after the surgery. Since then the edema has decreased.  Current Medications (verified): 1)  Metoprolol Tartrate 50 Mg Tabs (Metoprolol Tartrate) .... Take One Tablet Two Times A Day 2)  Omeprazole 40 Mg Cpdr (Omeprazole) .... Take One Capsule Daily 3)  Ativan 0.5 Mg  Tabs (Lorazepam) .... Three Times A Day As Needed 4)  Stool Softener 100 Mg Caps (Docusate Sodium)  .... Take One Tablet Daily 5)  Tums Ultra 1000 1000 Mg Chew (Calcium Carbonate Antacid) .... As Needed 6)  Adult Aspirin Ec Low Strength 81 Mg Tbec (Aspirin) .... Take 1 Tablet By Mouth Once A Day 7)  Furosemide 20 Mg Tabs (Furosemide) .... Take One Tablet Once Daily As Needed 8)  Vitamin D 1000 Unit Tabs (Cholecalciferol) .... Take 1 Tab Once Daily 9)  Vitamin E 400 Unit Caps (Vitamin E) .... Once A Day 10)  Vicodin .... Unsure of Strength  Allergies (verified): 1)  ! Pcn 2)  ! Asa 3)  ! Accupril 4)  ! * Toprol 5)  ! Lescol 6)  ! Zocor 7)  ! Lipitor 8)  ! * Zetia 9)  ! * Crestor 10)  ! Milk of Magnesia (Magnesium Hydroxide) 11)  ! Diovan  Past History:  Past Medical History: Reviewed history from 10/19/2009 and no changes required. 1. Headache - migraines 2. Hypertension 3. Hyperlipidemia 4. Osteoporosis 5. Coronary artery disease: Mild nonobstructive disease on cath in 2003.  6  CVA 7. GI issues: GERD Hiatal hernia, Hemorrhoids, Hyperplastic colon polyp, 06/2001, Functional LLQ pain, h/o diverticuli 8. Obstructive sleep apnea 9. varicose veins 10. h/o R leg DVT after a venous ablation 11. OA with bilateral knee pain 12. 4-09  L2 fracture status post vertebroplasty of L2 September 10, 2007 performed  by IR 13.  Depression/anxiety -lost husband  14.  Probable Takotsubo cardiomyopathy: Severe chest pain with normal cath in 1994.  Severe chest pain in 2003 with widespread T wave inversions on ECG.  Cath with minimal coronary disease but LV-gram showed periapical severe hypokinesis and basilar hyperkinesia (EF 40%).  Last echo in 4/09 confirmed full LV functional recovery with EF 60%, no regional wall motion abnormalities, mild to moderate LVH.   Past Surgical History: Appendectomy left ovarian cyst 3/07 - stress test neg Tonsillectomy Back Surgery Tubal Ligation Dental extraction  L maxillary molar Kyphoplasty 08/2007  left total knee replacement 06/2010  Social  History: Reviewed history from 04/24/2010 and no changes required. Married, lost husband 07-29-07.  [place]ives in [place].  lives in a town house, her daughter moved in w/ her  driving  Former Smoker: quit 1990 Alcohol use-yes children x 3 ( 2 in [place] , see them occasionaly) Daily Caffeine Use coffee  Physical Exam  General:  alert and well-developed.   no apparent distress Neck:   no JVD at 45 Lungs:  normal respiratory effort, no intercostal retractions, no accessory muscle use, and normal breath sounds.   Heart:  normal rate, regular rhythm, and no murmur.   Abdomen:  soft, non-tender, no distention, no masses, no guarding, and no rigidity.   Extremities:   right leg without edema Left leg, where she had surgery, with +/+++ edema Psych:  not anxious appearing and not depressed appearing.     Impression & Recommendations:  Problem # 1:  ANOREXIA (ICD-783.0)  unclear etiology, GI review of systems negative Trial of megestrol   zofran for mild nausea per patient request  Problem # 2:  WEIGHT LOSS (ICD-783.21)   likely due to #1 Labs  Orders: Venipuncture (16109) TLB-BMP (Basic Metabolic Panel-BMET) (80048-METABOL) TLB-CBC Platelet - w/Differential (85025-CBCD) TLB-TSH (Thyroid Stimulating Hormone) (84443-TSH) Specimen Handling (60454)  Problem # 3:  CARDIOMYOPATHY (ICD-425.4) patient with a history of cardiomyopathy, pre-operative eval  included a stress test without ischemia.  complaining of dyspnea since the surgery, had a  negative for DVT since the surgery. She is not volume overloaded. Plan: Chest x-ray, labs  Orders: T-2 View CXR (71020TC)  Problem # 4:  HYPERTENSION (ICD-401.9)  occasionally, low BP as an outpatient. See instructions Her updated medication list for this problem includes:    Metoprolol Tartrate 50 Mg Tabs (Metoprolol tartrate) .Marland Kitchen... Take one tablet two times a day    Furosemide 20 Mg Tabs (Furosemide) .Marland Kitchen... Take one tablet once daily as  needed  BP today: 126/82 Prior BP: 140/74 (06/13/2010)  Labs Reviewed: K+: 3.9 (08/01/2009) Creat: : 0.8 (08/01/2009)   Chol: 234 (10/19/2009)   HDL: 58.10 (10/19/2009)   LDL: DEL (06/25/2007)   TG: 74.0 (10/19/2009)  Complete Medication List: 1)  Metoprolol Tartrate 50 Mg Tabs (Metoprolol tartrate) .... Take one tablet two times a day 2)  Omeprazole 40 Mg Cpdr (Omeprazole) .... Take one capsule daily 3)  Ativan 0.5 Mg Tabs (Lorazepam) .... Three times a day as needed 4)  Stool Softener 100 Mg Caps (Docusate sodium) .... Take one tablet daily 5)  Tums Ultra 1000 1000 Mg Chew (Calcium carbonate antacid) .... As needed 6)  Adult Aspirin Ec Low Strength 81 Mg Tbec (Aspirin) .... Take 1 tablet by mouth once a day 7)  Furosemide 20 Mg Tabs (Furosemide) .... Take one tablet once daily as needed 8)  Vitamin D 1000 Unit Tabs (Cholecalciferol) .... Take 1 tab once daily 9)  Vitamin E 400 Unit Caps (Vitamin e) .... Once  a day 10)  Vicodin  .... Unsure of strength 11)  Megestrol Acetate 40 Mg/ml Susp (Megestrol acetate) .Marland Kitchen.. 10cc by mouth two times a day as needed lack of appetite 12)  Ondansetron Hcl 4 Mg Tabs (Ondansetron hcl) .Marland Kitchen.. 1 every 6 hours as needed nausea  Patient Instructions: 1)  Check your blood pressure 2 or 3 times a week. If it is more than 140/85  or less than 100/60 consistently,please let us know 2)  Please schedule a follow-up appointment in 1 month.  Prescriptions: ONDANSETRON HCL 4 MG TABS (ONDANSETRON HCL) 1 every 6 hours as needed nausea  #20 x 0   Entered and Authorized by:   Nolon Rod. Aryan Bello MD   Signed by:   Nolon Rod. Vivienne Sangiovanni MD on 07/28/2010   Method used:   Print then Give to Patient   RxID:   [id number] MEGESTROL ACETATE 40 MG/ML SUSP (MEGESTROL ACETATE) 10cc by mouth two times a day as needed lack of appetite  #600cc x 0   Entered and Authorized by:   Nolon Rod. Zenna Traister MD   Signed by:   Nolon Rod. Rosa Wyly MD on 07/28/2010   Method used:   Print then Give to Patient   RxID:    [id number]    Orders Added: 1)  Venipuncture [36415] 2)  TLB-BMP (Basic Metabolic Panel-BMET) [80048-METABOL] 3)  TLB-CBC Platelet - w/Differential [85025-CBCD] 4)  TLB-TSH (Thyroid Stimulating Hormone) [84443-TSH] 5)  T-2 View CXR [71020TC] 6)  Specimen Handling [99000] 7)  Est. Patient Level IV [60630]

## 2010-08-19 LAB — CBC
HCT: 39.7 % (ref 36.0–46.0)
HCT: 40.8 % (ref 36.0–46.0)
HCT: 42.1 % (ref 36.0–46.0)
HCT: 51.6 % — ABNORMAL HIGH (ref 36.0–46.0)
Hemoglobin: 13.4 g/dL (ref 12.0–15.0)
Hemoglobin: 13.6 g/dL (ref 12.0–15.0)
Hemoglobin: 13.9 g/dL (ref 12.0–15.0)
Hemoglobin: 16.5 g/dL — ABNORMAL HIGH (ref 12.0–15.0)
MCHC: 31.9 g/dL (ref 30.0–36.0)
MCHC: 33.1 g/dL (ref 30.0–36.0)
MCHC: 33.4 g/dL (ref 30.0–36.0)
MCHC: 33.7 g/dL (ref 30.0–36.0)
MCV: 83.1 fL (ref 78.0–100.0)
MCV: 83.4 fL (ref 78.0–100.0)
MCV: 83.7 fL (ref 78.0–100.0)
MCV: 83.9 fL (ref 78.0–100.0)
Platelets: 337 10*3/uL (ref 150–400)
Platelets: 358 10*3/uL (ref 150–400)
Platelets: 370 10*3/uL (ref 150–400)
Platelets: 383 10*3/uL (ref 150–400)
RBC: 4.78 MIL/uL (ref 3.87–5.11)
RBC: 4.87 MIL/uL (ref 3.87–5.11)
RBC: 5.05 MIL/uL (ref 3.87–5.11)
RBC: 6.15 MIL/uL — ABNORMAL HIGH (ref 3.87–5.11)
RDW: 14.8 % (ref 11.5–15.5)
RDW: 15.1 % (ref 11.5–15.5)
RDW: 15.3 % (ref 11.5–15.5)
RDW: 15.5 % (ref 11.5–15.5)
WBC: 11.1 10*3/uL — ABNORMAL HIGH (ref 4.0–10.5)
WBC: 13.6 10*3/uL — ABNORMAL HIGH (ref 4.0–10.5)
WBC: 8.8 10*3/uL (ref 4.0–10.5)
WBC: 9.5 10*3/uL (ref 4.0–10.5)

## 2010-08-19 LAB — BASIC METABOLIC PANEL
BUN: 20 mg/dL (ref 6–23)
BUN: 3 mg/dL — ABNORMAL LOW (ref 6–23)
BUN: 4 mg/dL — ABNORMAL LOW (ref 6–23)
BUN: 6 mg/dL (ref 6–23)
BUN: 9 mg/dL (ref 6–23)
CO2: 27 mEq/L (ref 19–32)
CO2: 27 mEq/L (ref 19–32)
CO2: 27 mEq/L (ref 19–32)
CO2: 28 mEq/L (ref 19–32)
CO2: 30 mEq/L (ref 19–32)
Calcium: 8.1 mg/dL — ABNORMAL LOW (ref 8.4–10.5)
Calcium: 8.4 mg/dL (ref 8.4–10.5)
Calcium: 8.6 mg/dL (ref 8.4–10.5)
Calcium: 8.9 mg/dL (ref 8.4–10.5)
Calcium: 9 mg/dL (ref 8.4–10.5)
Chloride: 100 mEq/L (ref 96–112)
Chloride: 102 mEq/L (ref 96–112)
Chloride: 104 mEq/L (ref 96–112)
Chloride: 98 mEq/L (ref 96–112)
Chloride: 99 mEq/L (ref 96–112)
Creatinine, Ser: 0.75 mg/dL (ref 0.4–1.2)
Creatinine, Ser: 0.81 mg/dL (ref 0.4–1.2)
Creatinine, Ser: 0.83 mg/dL (ref 0.4–1.2)
Creatinine, Ser: 0.85 mg/dL (ref 0.4–1.2)
Creatinine, Ser: 0.97 mg/dL (ref 0.4–1.2)
GFR calc Af Amer: >60 mL/min (ref >60)
GFR calc Af Amer: >60 mL/min (ref >60)
GFR calc Af Amer: >60 mL/min (ref >60)
GFR calc Af Amer: >60 mL/min (ref >60)
GFR calc non Af Amer: >60 mL/min (ref >60)
GFR calc non Af Amer: >60 mL/min (ref >60)
GFR calc non Af Amer: >60 mL/min (ref >60)
GFR calc non Af Amer: >60 mL/min (ref >60)
Glucose, Bld: 113 mg/dL — ABNORMAL HIGH (ref 70–99)
Glucose, Bld: 120 mg/dL — ABNORMAL HIGH (ref 70–99)
Glucose, Bld: 129 mg/dL — ABNORMAL HIGH (ref 70–99)
Glucose, Bld: 132 mg/dL — ABNORMAL HIGH (ref 70–99)
Glucose, Bld: 143 mg/dL — ABNORMAL HIGH (ref 70–99)
Potassium: 3.2 mEq/L — ABNORMAL LOW (ref 3.5–5.1)
Potassium: 3.3 mEq/L — ABNORMAL LOW (ref 3.5–5.1)
Potassium: 3.5 mEq/L (ref 3.5–5.1)
Potassium: 3.6 mEq/L (ref 3.5–5.1)
Potassium: 4 mEq/L (ref 3.5–5.1)
Sodium: 132 mEq/L — ABNORMAL LOW (ref 135–145)
Sodium: 133 mEq/L — ABNORMAL LOW (ref 135–145)
Sodium: 135 mEq/L (ref 135–145)
Sodium: 137 mEq/L (ref 135–145)
Sodium: 139 mEq/L (ref 135–145)

## 2010-08-19 LAB — BRAIN NATRIURETIC PEPTIDE
Pro B Natriuretic peptide (BNP): 45 pg/mL (ref 0.0–100.0)
Pro B Natriuretic peptide (BNP): 51 pg/mL (ref 0.0–100.0)

## 2010-08-19 LAB — URINALYSIS, MICROSCOPIC ONLY
Bilirubin Urine: NEGATIVE
Glucose, UA: NEGATIVE mg/dL
Ketones, ur: NEGATIVE mg/dL
Nitrite: POSITIVE — AB
Protein, ur: 100 mg/dL — AB
Specific Gravity, Urine: 1.017 (ref 1.005–1.030)
Urobilinogen, UA: 1 mg/dL (ref 0.0–1.0)
pH: 6 (ref 5.0–8.0)

## 2010-08-19 LAB — DIFFERENTIAL
Basophils Absolute: 0 10*3/uL (ref 0.0–0.1)
Basophils Relative: 0 % (ref 0–1)
Eosinophils Absolute: 0 10*3/uL (ref 0.0–0.7)
Eosinophils Relative: 0 % (ref 0–5)
Lymphocytes Relative: 4 % — ABNORMAL LOW (ref 12–46)
Lymphs Abs: 0.5 10*3/uL — ABNORMAL LOW (ref 0.7–4.0)
Monocytes Absolute: 1 10*3/uL (ref 0.1–1.0)
Monocytes Relative: 10 % (ref 3–12)
Neutro Abs: 9.5 10*3/uL — ABNORMAL HIGH (ref 1.7–7.7)
Neutrophils Relative %: 86 % — ABNORMAL HIGH (ref 43–77)

## 2010-08-19 LAB — CULTURE, BLOOD (ROUTINE X 2)
Culture: NO GROWTH
Culture: NO GROWTH

## 2010-08-19 LAB — HEPATIC FUNCTION PANEL
ALT: 22 U/L (ref 0–35)
AST: 27 U/L (ref 0–37)
Albumin: 3.1 g/dL — ABNORMAL LOW (ref 3.5–5.2)
Alkaline Phosphatase: 87 U/L (ref 39–117)
Bilirubin, Direct: 0.2 mg/dL (ref 0.0–0.3)
Indirect Bilirubin: 0.8 mg/dL (ref 0.3–0.9)
Total Bilirubin: 1 mg/dL (ref 0.3–1.2)
Total Protein: 6.9 g/dL (ref 6.0–8.3)

## 2010-08-19 LAB — URINE CULTURE
Colony Count: >=100000
Special Requests: NEGATIVE

## 2010-08-24 ENCOUNTER — Encounter: Payer: Self-pay | Admitting: Internal Medicine

## 2010-08-28 ENCOUNTER — Ambulatory Visit: Payer: Medicare Other | Admitting: Internal Medicine

## 2010-08-29 ENCOUNTER — Encounter: Payer: Self-pay | Admitting: Cardiology

## 2010-09-17 ENCOUNTER — Emergency Department (HOSPITAL_BASED_OUTPATIENT_CLINIC_OR_DEPARTMENT_OTHER)
Admission: EM | Admit: 2010-09-17 | Discharge: 2010-09-17 | Disposition: A | Discharge status: Home / Self Care | Payer: Medicare Other | Attending: Emergency Medicine | Admitting: Emergency Medicine

## 2010-09-17 DIAGNOSIS — I10 Essential (primary) hypertension: Secondary | ICD-10-CM | POA: Insufficient documentation

## 2010-09-17 DIAGNOSIS — S61409A Unspecified open wound of unspecified hand, initial encounter: Secondary | ICD-10-CM | POA: Insufficient documentation

## 2010-09-17 DIAGNOSIS — Y92009 Unspecified place in unspecified non-institutional (private) residence as the place of occurrence of the external cause: Secondary | ICD-10-CM | POA: Insufficient documentation

## 2010-09-17 DIAGNOSIS — W268XXA Contact with other sharp object(s), not elsewhere classified, initial encounter: Secondary | ICD-10-CM | POA: Insufficient documentation

## 2010-09-17 DIAGNOSIS — Z8679 Personal history of other diseases of the circulatory system: Secondary | ICD-10-CM | POA: Insufficient documentation

## 2010-09-26 NOTE — Discharge Summary (Signed)
[name], [name]             ACCOUNT NO.:  [id number]   MEDICAL RECORD NO.:  [id number]          PATIENT TYPE:  INP   LOCATION:  1416                         FACILITY:  [facility]   PHYSICIAN:  Barbette Hair. Artist Pais, DO      DATE OF BIRTH:  [birth year]   DATE OF ADMISSION:  12/27/2008  DATE OF DISCHARGE:  01/01/2009                               DISCHARGE SUMMARY   DISCHARGE DIAGNOSES:  1. Urinary tract infection/early pyelonephritis.  2. Oral thrush.  3. Hypertension.  4. Shortness of breath presumed secondary to atelectasis.  5. Gastroesophageal reflux disease.  6. History of coronary artery disease.  7. History of tobacco abuse.   DISCHARGE MEDICATIONS:  1. Cipro 500 mg twice daily x5 days.  2. Nystatin 100,000 units/mL, 10 mL q.i.d. swish and swallow x1 week.  3. Clotrimazole troche 10 mg four times a day x1 week.  4. Metoprolol 50 mg twice daily.  5. Ativan 0.5 mg three times a day as needed.  6. Losartan 50 mg once daily.  7. Omeprazole 40 mg twice daily.  8. Docusate sodium 100 mg once daily.  9. Levsin 0.125 mg every 6 hours as needed.   FOLLOWUP INSTRUCTIONS:  Patient advised to call Dr. Leta Jungling office for  followup appointment within one week.  The patient also advised to call  primary care physician if she develops fever/chills, or shortness of  breath gets worse.  The patient is to use her incentive spirometer every  two hours while awake for  three to five days.   HOSPITAL COURSE:  The patient is a 77 year old white female who  presented to Dr. Alwyn Ren with chief complaint of fever, chills and  rigors.  The patient found to have urinary tract infection and was  promptly started on IV antibiotics.  Her urine culture showed E. coli  sensitive to Cipro.  Renal ultrasound was obtained which showed no sign  of obstruction.  Kidneys had normal appearance.  The patient was  slightly hypoxic and felt shortness of breath.  chest x-ray was ordered.  It was negative for acute  airspace disease.  BNP during hospital  admission also normal.  Her dyspnea may be secondary to atelectasis.  There was question of whether she has underlying COPD.  PFTs can be  considered as outpatient.   Her blood pressure remained stable on home medications.  She suffered  from oral thrush secondary to Cipro.  The patient to continue oral  Nystatin and clotrimazole x1 week.   LABORATORY DATA:  CBC on December 31, 2008:  WBC 9.5, hemoglobin and  hematocrit of 13.4/39.7, platelet count of 358.  Basic metabolic  profile:  Sodium 139, potassium 4, chloride 104, CO2 - 30, glucose 113,  BUN 3, creatinine 0.83.  BNP on December 29, 2008 - 45.  Blood culture no  growth to date.  Urine culture E. coli sensitive to ciprofloxacin.   X-RAY STUDIES:  Renal ultrasound  - no hydronephrosis.  Chest x-ray  December 31, 2008:  Mild diffuse increase in interstitial markings  representing a chronic finding, lungs appeared hyperaerated, no other  acute chest  findings.   CONDITION ON DISCHARGE:  The patient was afebrile and was not short of  breath with ambulation. She was felt medically stable for discharge.      Barbette Hair. Artist Pais, DO  Electronically Signed     RDY/MEDQ  D:  01/01/2009  T:  01/01/2009  Job:  [id number]   cc:   Willow Ora, MD  [phone] [address] [place], [place] [address]

## 2010-09-26 NOTE — Procedures (Signed)
LOWER EXTREMITY VENOUS REFLUX EXAM   INDICATION:  Left leg varicose vein and pain.   EXAM:  Using color-flow imaging and pulse Doppler spectral analysis, the  left common femoral, superficial femoral, posterior tibial, greater and  lesser. Saphenous veins are evaluated.  There is evidence suggesting  deep venous insufficiency in the left lower extremity.   The left saphenofemoral junction is competent.  The left GSV is not  competent with the caliber as described below.   The left proximal short saphenous vein demonstrates competency.   GSV Diameter (used if found to be incompetent only)                                            Right    Left  Proximal Greater Saphenous Vein           cm       0.72 cm  Proximal-to-mid-thigh                     cm       0.72 cm  Mid thigh                                 cm       0.58 cm  Mid-distal thigh                          cm       0.58 cm  Distal thigh                              cm       0.60 cm  Knee                                      cm       0.60 cm   IMPRESSION:  1. Left greater saphenous vein reflux is identified with the caliber      ranging from 0.60 cm to 1.09 cm knee to groin.  2. The left greater saphenous vein is not aneurysmal.  3. The left greater saphenous vein is not tortuous.  4. There is no evidence of significant deep venous insufficiency in      the left lower extremity.  5. The deep venous system is not competent.  6. The left lesser saphenous vein is competent.  7. There is no deep venous thrombosis noted in the left leg.   ___________________________________________  Quita Skye. Hart [place], M.D.   MG/MEDQ  D:  12/03/2006  T:  12/04/2006  Job:  [id number]

## 2010-09-26 NOTE — H&P (Signed)
[name], [name]             ACCOUNT NO.:  [id number]   MEDICAL RECORD NO.:  [id number]          PATIENT TYPE:  INP   LOCATION:  5501                         FACILITY:  [facility]   PHYSICIAN:  Therisa Doyne, MD    DATE OF BIRTH:  [birth year]   DATE OF ADMISSION:  09/07/2007  DATE OF DISCHARGE:                              HISTORY & PHYSICAL   PRIMARY CARE PHYSICIAN:  Willow Ora, MD   CHIEF COMPLAINT:  Shortness of breath.   HISTORY OF PRESENT ILLNESS:  A 77 year old white female with past  medical history significant for hypertension and vertebral fracture who  presents with shortness of breath. The patient describes having a  smothering sensation consisting of shortness of breath since this past  Thursday. It has been associated with chest heaviness; however, she  denies any frank chest pain. Because of these symptoms, the patient came  to the emergency department last evening and was seen and evaluated at  that time. Her CBC, BMP, and cardiac enzymes were unremarkable. She had  an ABG performed which showed a PAO2 of 59% and unknown FIO2. Her D-  dimer was negative as was her brain natriuretic peptide. It was felt  that some of her symptoms could be related to back pain and anxiety. She  was discharged with Ativan.   The patient represents this evening with continued shortness of breath.  Of note, the patient has had a recent diagnosis of vertebral fracture by  MRI in April 2009. Because of this, she has had significant pain and has  been placed on Vicodin. Also, the patient's spouse unexpectedly died  within the past month, and the patient's daughter admits to me that she  feels that a lot of these symptoms are being driven by anxiety and the  grieving process.   REVIEW OF SYSTEMS:  All systems reviewed and are negative except as  mentioned above in the history of present illness.   PAST MEDICAL HISTORY:  1. Obstructive sleep apnea.  2. Hypertension.  3. Vertebral  compression fractures, diagnosed by MRI April 2009.   SOCIAL HISTORY:  The patient lives alone at home. Her spouse did  recently pass away. She denies tobacco, alcohol, or drugs.   FAMILY HISTORY:  Positive for hypertension.   MEDICATIONS:  1. Metoprolol 25 mg b.i.d.  2. Prilosec daily.  3. Vicodin 5/500 mg tablets every 5-6 hours as needed.  4. Ativan 1 mg t.i.d. p.r.n. This was started yesterday.   ALLERGIES:  PENICILLIN.   PHYSICAL EXAMINATION:  VITAL SIGNS:  Temperature 97.9, blood pressure  152/97, pulse 92, respirations 22. Oxygen saturation 97% on room air.  GENERAL:  No acute distress.  HEENT:  Normocephalic, atraumatic. Pupils equal, round, and reactive to  light and accommodation. Extraocular movements are intact. Oropharynx is  pink and moist without any lesions.  NECK:  Supple. There is elevated jugular venous pressure, approximately  8cm above the clavicle.  CARDIOVASCULAR:  Regular rate and rhythm.  CHEST:  Clear to auscultation bilaterally.  ABDOMEN:  Positive bowel sounds. Soft, nontender, nondistended.  EXTREMITIES:  Trace lower extremity edema bilaterally.  SKIN:  No rashes.  BACK:  No CVA tenderness.   LABORATORY STUDIES:  Laboratory studies obtained last night revealed an  elevated hemoglobin of 16.2, platelets 587. BMP within normal limits.  ABG showed a pH of 7.48, PCO2 of 34, PAO2 of 59. D-dimer was 0.43 which  was within the normal range. BNP was within the normal range, and  cardiac enzymes were negative.   Chest x-ray from last night showed no acute cardiopulmonary disease.   EKG from this evening shows normal sinus rhythm at 76 beats per minute  with no acute ST or T-wave changes.   ASSESSMENT AND PLAN:  1. Admit to the [facility] Service in telemetry bed under Dr.      Con Memos care.   1. Shortness of breath, likely multifactorial in nature. I am      concerned about congestive heart failure exacerbation as the      patient  does have significantly elevated jugular venous pressure.      However, her x-ray does not support this, and neither does her      brain natriuretic peptide. Nonetheless, she does have elevated      jugular venous pressure, and we will diurese her with Lasix 40 mg      IV x1. Continue her on her Lopressor. We will rule the patient out      for myocardial infarction, monitor her on telemetry and check a      transthoracic echocardiogram in the morning. We will check a chest      x-ray tonight. Some of her shortness of breath may be being driving      by anxiety, and we will continue her on her Ativan. Additionally in      the differential is pulmonary embolism. She does have a history of      deep venous thrombosis in the setting of her hip fracture several      years ago. At this time, my suspicion is low for pulmonary      embolism. Her D-dimer is normal. Nonetheless, we will check lower      extremity Dopplers to rule out deep venous thrombosis.   1. Back pain. Physical therapy evaluation will be needed. The patient      does have evidence of an endplate compression fracture at L2.      Consider kyphoplasty versus consultation to orthopedic surgery.      Pain control with Vicodin and morphine as needed.   1. Hypertension. Continue Metoprolol.   1. Gastroesophageal reflux disease. Protonix.   1. FEN: SLIVF, electrolytes are stable. Low-salt diet.   1. Deep venous thrombosis prophylaxis with Lovenox.      Therisa Doyne, MD  Electronically Signed     SJT/MEDQ  D:  09/07/2007  T:  09/07/2007  Job:  [id number]

## 2010-09-26 NOTE — Procedures (Signed)
DUPLEX DEEP VENOUS EXAM - LOWER EXTREMITY   INDICATION:  Known common femoral vein deep venous thrombosis.   HISTORY:  Edema:  Yes.  Trauma/Surgery:  No.  Pain:  No.  PE:  No.  Previous DVT:  Yes.  Anticoagulants:  No.  Other:   DUPLEX EXAM:                CFV   SFV   PopV   PTV   GSV                R  L  R  L  R   L  R  L  R  L  Thrombosis    0. 0. 0     0      0     0  Spontaneous   +  +  +     +.     +.    +.  Phasic        +  +  +     +      +     +  Augmentation  +  +  +     +      +     +  Compressible  +  +  +     +      +     +  Competent     +  +  +     +      +     +   Legend:  + - yes  o - no  p - partial  D - decreased   IMPRESSION:  No evidence of deep vein thrombosis noted in the right leg.   _____________________________  Quita Skye Hart [place], M.D.   MG/MEDQ  D:  12/03/2006  T:  12/04/2006  Job:  [id number]

## 2010-09-26 NOTE — Assessment & Plan Note (Signed)
OFFICE VISIT   [name], [name]  DOB:  1929-11-10                                       12/03/2006  BJYNW#:[id number]   The patient returns today with no edema in the right leg from her  previous DVT which occurred following laser ablation of the right  greater saphenous vein last fall.  Venous duplex exam today reveals no  evidence of the venous obstruction or abnormality in either leg in the  deep systems.  She does have severe reflux at the left superficial  saphenofemoral junction and throughout the entire left greater saphenous  vein, with symptomatic varicosities in the left calf and distal thigh.  She is having no distal edema in either lower extremity.  She has worn  elastic compression stockings with no complete relief of her symptoms in  the left leg, and has also tried analgesics.  She is an excellent  candidate for laser ablation of the left greater saphenous vein with  multiple stab phlebectomies, and will consider this, be back in touch  with [place] in the near future if she would like to proceed.  She is off her  Coumadin now and has been for 3 months, continues to take 1 aspirin per  day, and she was reassured regarding the deep venous system.   Quita Skye Hart [place], M.D.  Electronically Signed   JDL/MEDQ  D:  12/03/2006  T:  12/04/2006  Job:  [id number]

## 2010-09-26 NOTE — Discharge Summary (Signed)
NAMEBONNIEJEAN, PIANO             ACCOUNT NO.:  [id number]   MEDICAL RECORD NO.:  [id number]          PATIENT TYPE:  INP   LOCATION:  5527                         FACILITY:  [facility]   PHYSICIAN:  Valerie A. Felicity Coyer, MDDATE OF BIRTH:  [birth year]   DATE OF ADMISSION:  09/07/2007  DATE OF DISCHARGE:  09/15/2007                               DISCHARGE SUMMARY   DISCHARGE DIAGNOSES:  1. Acute on chronic low back pain status post MRI of acute/subacute L2      fracture status post vertebroplasty of L2 September 10, 2007 performed      by interventional radiology.  2. Shortness of breath on admission likely multifactorial in setting      of pain, anxiety, resolved.  3. Depression/anxiety started on SSRI during this admission.  Plan to      continue p.r.n. Ativan.  4. Obstructive sleep apnea.  5. Hypertension.  6. Gastroesophageal reflux disease.   HISTORY OF PRESENT ILLNESS:  Ms. Pettibone is a 77 year old white female  who was admitted on September 07, 2007 with chief complaint of shortness of  breath.  She noted a smothering sensation which had been present since  the Thursday prior to admission.  It was accompanied by chest heaviness.  The patient was evaluated in the emergency department at 2 a.m. on April  26 with negative workup and discharged to home.  She presented with  ongoing shortness of breath, and it was noted that patient lost her  husband less than one month ago and that she had a recent vertebral  fracture.  She was admitted for further evaluation and treatment.   COURSE OF HOSPITALIZATION:  1. Acute on chronic low back pain.  The patient was admitted.  She had      an MRI performed prior to this admission on August 28, 2007 which      noted an acute/subacute L2 fracture.  Workup was initiated      outpatient by orthopedics.  She was evaluated by interventional      radiology.  Successfully underwent vertebroplasty of L2 compression      fracture on September 10, 2007.  She developed  a brief episode of      increased back pain post procedure, and underwent a CT which showed      no acute changes.  2. Depression/anxiety.  The patient recently lost her spouse, and has      been quite depressed, quite anxious ever since according to the      family.  She was started on an SSRI during this admission as well      as p.r.n. Ativan.  At this time we plan to continue these      medications.  Consider outpatient follow-up with psychiatry and/or      counseling for bereavement.   PHYSICAL EXAMINATION:  VITAL SIGNS:  BP 152/72, heart rate 72,  respiratory rate 18, temp 98, O2 sat 98% on room air.  GENERAL:  The patient is an elderly white female who is awake and alert  and in acute distress.  CARDIOVASCULAR:  S1, S2, regular rate and rhythm.  LUNGS:  Clear to auscultation bilaterally without wheezes, rales or  rhonchi.  ABDOMEN:  Soft, nontender, nondistended.  EXTREMITIES:  Show no peripheral edema.  NEUROLOGICAL:  Patient is moving all extremities.  She is awake, alert,  oriented x4.  Speech is clear.   PERTINENT LABORATORY DATA:  At time of discharge hemoglobin 16.1,  hematocrit 48.8.  INR 1.   MEDICATIONS AT TIME OF DISCHARGE:  1. Lopressor 25 mg p.o. b.i.d.  2. Protonix 40 mg p.o. daily.  3. Lovenox 40 mg daily for DVT prophylaxis.  4. Cipro 10 mg p.o. daily.  5. Tylenol 650 mg p.o. q.4 h. p.r.n.  6. Ativan 0.5 mg p.o. t.i.d. p.r.n.  7. Vicodin 5/325 two tabs p.o. q.4 h. p.r.n.  8. Phenergan 12.5 mg p.o. q.4 h. p.r.n.  9. Propranolol 10 mg p.o. q.8 h. p.r.n.  10.Fioricet one tab p.o. q.8 h p.r.n.   DISPOSITION:  The patient will be discharged to a skilled nursing  facility upon discharge as needed.  She will need follow-up with primary  care Lumi [facility] at [facility], Dr. Willow Ora.      Sandford Craze, NP      Raenette Rover. Felicity Coyer, MD  Electronically Signed    MO/MEDQ  D:  09/15/2007  T:  09/15/2007  Job:  [id number]   cc:   Willow Ora, MD

## 2010-09-29 ENCOUNTER — Encounter: Payer: Self-pay | Admitting: Internal Medicine

## 2010-09-29 ENCOUNTER — Ambulatory Visit (INDEPENDENT_AMBULATORY_CARE_PROVIDER_SITE_OTHER): Payer: Medicare Other | Admitting: Internal Medicine

## 2010-09-29 DIAGNOSIS — S61409A Unspecified open wound of unspecified hand, initial encounter: Secondary | ICD-10-CM

## 2010-09-29 NOTE — Progress Notes (Signed)
  Subjective:    Patient ID: [name], female    DOB: [birth year], 77 y.o.   MRN: [id number]  HPI Cut her right hand at home Sep 17, 2010, bleed for 5 hours according to the patient, then she decided to go to the ER. She received a tetanus shot, 4 sutures. Is here for suture removal.  Past Medical History  Diagnosis Date  . Headache     migraines  . Hypertension   . Hyperlipidemia   . Osteoporosis   . CAD (coronary artery disease)     mild nonobstructive disease on cath in 2003  . CVA (cerebral infarction)   . GERD (gastroesophageal reflux disease)   . Hiatal hernia   . Hemorrhoids   . Hyperplastic colon polyp 06/2001  . Diverticulitis   . OSA (obstructive sleep apnea)   . Varicose vein   . DVT (deep venous thrombosis)     after venous ablation, R leg  . OA (osteoarthritis) of knee     w/ bilateral knee pain  . Fracture 09/10/07    L2, status post vertebroplasty of L2 performed by IR  . Depression with anxiety     lost husband  . Cardiomyopathy     Probable Takotsubo, severe CP w/ normal cath in 1994. Severe CP in 2003 w/ widespread T wave inversions on ECG. Cath w/ minimal coronary disease but LV-gram showed periapical severe hypokinesis and basilar hyperkinesia (EF 40%). Last echo in 4/09 confirmed full LV functional recovery with EF 60%, no regional wall motion abnormalities, mild to moderate LVH.   Past Surgical History  Procedure Date  . Appendectomy   . Ovarian cyst surgery     left  . Tonsillectomy   . Back surgery   . Tubal ligation   . Dental extraction     L maxillary molar  . Kyphosis surgery 08/2007  . Total knee arthroplasty 06/2010    left     Review of Systems Denies any fevers, no discharge. No other injuries. She is keeping the wound cover and using over-the-counter antibiotic ointment. The pain is mild.     Objective:   Physical Exam  Musculoskeletal:       Arms:         Assessment & Plan:

## 2010-09-29 NOTE — Cardiovascular Report (Signed)
[place]  Patient:    [name], [name] Visit Number: [id number] MRN: [id number]          Service Type: MED Location: [id number] Attending Physician:  Silvestre Mesi Dictated by:   Aram Candela. Aleen Campi, M.D. Proc. Date: 07/14/01 Admit Date:  07/14/2001   CC:         Cardiac Catheterization Laboratory   Cardiac Catheterization  PROCEDURES: 1. Emergency left heart catheterization. 2. Coronary cineangiography. 3. Left ventricular cineangiography. 4. Abdominal aortogram. 5. Perclose of the right femoral artery.  INDICATIONS FOR PROCEDURES: This 77 year old female with a long history of htn had the onset of severe anterior chest pain yesterday, July 13, 2001, which persisted most of the day and was partially relieved in the evening. Today, the pain returned and she was taken to the emergency room at [facility] by her husband. In the emergency room, she was noted to have a very abnormal electrocardiogram showing marked T wave abnormality with diffuse T wave inversion consistent with inferior and anterolateral ischemia. Cardiac enzymes obtained were abnormal with an abnormal troponin and CK-MB ratio. The emergency room physician called concerning her continued chest pain and abnormal enzymes, and felt that an emergency cardiac catheterization would benefit the patient.  DESCRIPTION OF PROCEDURE: After signing an informed consent, the patient was then transported from the emergency room at [facility] to the cardiac catheterization lab. Her right groin was prepped and draped in a sterile fashion and anesthetized locally with 1% lidocaine. A 6 French introducer sheath was inserted percutaneously into the right femoral artery. A 6 French #4 Judkins coronary catheters were used to make injections into the coronary arteries.  A 6 French pigtail catheter was used to measure pressures in the left ventricle and aorta and to make mid  stream injections into the left ventricle and abdominal aorta.  The patient tolerated the procedure well and no complications were noted. At the end of the procedure, the catheter and sheath were removed from the right femoral artery and hemostasis was easily obtained with a Perclose closure system.  MEDICATIONS GIVEN: Versed 1 mg IV, fentanyl 25 mcg IV.  HEMODYNAMIC DATA: Left ventricular pressure 122/10-39, aortic pressure 119/65 with a mean of 89.  CINE FINDINGS:  CORONARY CINEANGIOGRAPHY: Left coronary artery: The ostium appears normal. The bulk of the left main appears normal with a plaque at its distal end at the trifurcation. This plaque appears in the average of use to be approximately 25% stenotic. There is good antegrade flow into the three vessels this supplies.  Left anterior descending: The LAD has minor irregularities in its proximal and middle segment, but has normal antegrade flow and otherwise normal runoff distally. The septal and anterolateral branches appear normal. Intermediate branch, appears normal.  Circumflex coronary artery: The circumflex has an ostial lesion which is a continuation of the distal LAD plaque showing a 25% ostial stenosis. The middle and distal segments of the circumflex appear normal. There is normal antegrade flow and normal distal runoff.  Right coronary artery: The right coronary artery is a large dominant vessel supplying the posterior descending and posterolateral circulation. The ostium appears normal. There is several minor plaques in the proximal to mid segment with a focal eccentric 25% stenotic lesions. There is normal antegrade flow and normal distal runoff.  LEFT VENTRICULAR CINEANGIOGRAM: The left ventricular chamber size appears normal. There is severe anteroapical akinesia with normal to hyperdynamic contraction of the anterior base and the inferior  base and inferior segment. The overall left ventricular contractility is  mildly decreased between 40-50%. The mitral and aortic valves appear normal.  ABDOMINAL AORTOGRAM: The suprarenal abdominal aorta appears normal.  The renal arteries both appear normal.  The infrarenal abdominal aorta has mild atherosclerotic plaque which is nonobstructive and there is no aneurysmal dilatation. There are normal-appearing common iliac arteries. There is normal flow distally.  FINAL DIAGNOSES: 1. Mild coronary artery disease with 25% distal left main stenosis,    25% ostial circumflex stenosis and 25% proximal right coronary artery    stenosis. 2. Normal antegrade flow and normal distal runoff in all coronary arteries. 3. Severe antral-apical akinesia with hyperdynamic inferior and basilar    contractility. Ejection fraction overall 40-50%. 4. Mild atherosclerosis of the distal abdominal aorta with normal renal    arteries. 5. Successful Perclose of the right femoral artery.  DISPOSITION: When review of all aspects with the marked antral-apical akinesia along with a minor increase in cardiac enzymes and severe anterior chest pain, we have the hope that this represents a sudden closure and spontaneous reopening, and hopefully stunned myocardium which will recover fully. Certainly, she is not a candidate for angioplasty or bypass graft surgery, and we will follow her progress with further enzymes overnight, repeat ECG in the a.m. and hopefully be able to discharge tomorrow. We will then follow with an echocardiogram in the office to assess her left ventricular function in the future. Dictated by:   Aram Candela. Aleen Campi, M.D. Attending Physician:  Silvestre Mesi DD:  07/14/01 TD:  07/15/01 Job: [id number]

## 2010-09-29 NOTE — Assessment & Plan Note (Addendum)
The wound is healing well but I believe suture needs to stay 2-3 more days . Plan: Cont w/ neosporin RTC Monday ( 3 days) for removal---no charge on that day visit

## 2010-10-02 ENCOUNTER — Encounter: Payer: Self-pay | Admitting: Internal Medicine

## 2010-10-02 ENCOUNTER — Ambulatory Visit (INDEPENDENT_AMBULATORY_CARE_PROVIDER_SITE_OTHER): Payer: Medicare Other | Admitting: Internal Medicine

## 2010-10-02 DIAGNOSIS — S61409A Unspecified open wound of unspecified hand, initial encounter: Secondary | ICD-10-CM

## 2010-10-02 NOTE — Progress Notes (Signed)
  Subjective:    Patient ID: [name], female    DOB: Jul 12, 1929, 77 y.o.   MRN: [id number]  HPI Wound check   Review of Systems     Objective:   Physical Exam wound seems to be healing well.       Assessment & Plan:

## 2010-10-02 NOTE — Assessment & Plan Note (Signed)
Wound continue to heal well, no redness, discharge, fluctuance noted. Sutures removed, and will call if develops any signs of infection

## 2010-10-04 ENCOUNTER — Other Ambulatory Visit: Payer: Medicare Other

## 2011-01-01 ENCOUNTER — Other Ambulatory Visit: Payer: Self-pay | Admitting: Internal Medicine

## 2011-01-08 ENCOUNTER — Inpatient Hospital Stay (HOSPITAL_COMMUNITY)
Admission: EM | Admit: 2011-01-08 | Discharge: 2011-01-09 | DRG: 313 | Disposition: A | Discharge status: Home / Self Care | Payer: Medicare Other | Attending: Cardiology | Admitting: Cardiology

## 2011-01-08 ENCOUNTER — Telehealth: Payer: Self-pay | Admitting: Cardiology

## 2011-01-08 ENCOUNTER — Emergency Department (HOSPITAL_COMMUNITY): Payer: Medicare Other

## 2011-01-08 DIAGNOSIS — Z96659 Presence of unspecified artificial knee joint: Secondary | ICD-10-CM

## 2011-01-08 DIAGNOSIS — Z85828 Personal history of other malignant neoplasm of skin: Secondary | ICD-10-CM

## 2011-01-08 DIAGNOSIS — M1990 Unspecified osteoarthritis, unspecified site: Secondary | ICD-10-CM | POA: Diagnosis present

## 2011-01-08 DIAGNOSIS — Z8673 Personal history of transient ischemic attack (TIA), and cerebral infarction without residual deficits: Secondary | ICD-10-CM

## 2011-01-08 DIAGNOSIS — Z8249 Family history of ischemic heart disease and other diseases of the circulatory system: Secondary | ICD-10-CM

## 2011-01-08 DIAGNOSIS — R0789 Other chest pain: Principal | ICD-10-CM | POA: Diagnosis present

## 2011-01-08 DIAGNOSIS — I428 Other cardiomyopathies: Secondary | ICD-10-CM | POA: Diagnosis present

## 2011-01-08 DIAGNOSIS — Z87891 Personal history of nicotine dependence: Secondary | ICD-10-CM

## 2011-01-08 DIAGNOSIS — E785 Hyperlipidemia, unspecified: Secondary | ICD-10-CM | POA: Diagnosis present

## 2011-01-08 DIAGNOSIS — Z880 Allergy status to penicillin: Secondary | ICD-10-CM

## 2011-01-08 DIAGNOSIS — R079 Chest pain, unspecified: Secondary | ICD-10-CM

## 2011-01-08 DIAGNOSIS — G4733 Obstructive sleep apnea (adult) (pediatric): Secondary | ICD-10-CM | POA: Diagnosis present

## 2011-01-08 DIAGNOSIS — I2510 Atherosclerotic heart disease of native coronary artery without angina pectoris: Secondary | ICD-10-CM | POA: Diagnosis present

## 2011-01-08 DIAGNOSIS — I10 Essential (primary) hypertension: Secondary | ICD-10-CM | POA: Diagnosis present

## 2011-01-08 DIAGNOSIS — Z7982 Long term (current) use of aspirin: Secondary | ICD-10-CM

## 2011-01-08 DIAGNOSIS — K219 Gastro-esophageal reflux disease without esophagitis: Secondary | ICD-10-CM | POA: Diagnosis present

## 2011-01-08 LAB — CK TOTAL AND CKMB (NOT AT ARMC)
CK, MB: 3.2 ng/mL (ref 0.3–4.0)
Relative Index: INVALID (ref 0.0–2.5)
Total CK: 48 U/L (ref 7–177)

## 2011-01-08 LAB — POCT I-STAT, CHEM 8
BUN: 8 mg/dL (ref 6–23)
Calcium, Ion: 1.22 mmol/L (ref 1.12–1.32)
Chloride: 101 mEq/L (ref 96–112)
Creatinine, Ser: 1 mg/dL (ref 0.50–1.10)
Glucose, Bld: 105 mg/dL — ABNORMAL HIGH (ref 70–99)
HCT: 55 % — ABNORMAL HIGH (ref 36.0–46.0)
Hemoglobin: 18.7 g/dL — ABNORMAL HIGH (ref 12.0–15.0)
Potassium: 4.3 mEq/L (ref 3.5–5.1)
Sodium: 140 mEq/L (ref 135–145)
TCO2: 29 mmol/L (ref 0–100)

## 2011-01-08 LAB — PROTIME-INR
INR: 1.01 (ref 0.00–1.49)
Prothrombin Time: 13.5 seconds (ref 11.6–15.2)

## 2011-01-08 LAB — HEPATIC FUNCTION PANEL
ALT: 10 U/L (ref 0–35)
AST: 16 U/L (ref 0–37)
Albumin: 4 g/dL (ref 3.5–5.2)
Alkaline Phosphatase: 97 U/L (ref 39–117)
Bilirubin, Direct: 0.2 mg/dL (ref 0.0–0.3)
Indirect Bilirubin: 1.2 mg/dL — ABNORMAL HIGH (ref 0.3–0.9)
Total Bilirubin: 1.4 mg/dL — ABNORMAL HIGH (ref 0.3–1.2)
Total Protein: 6.9 g/dL (ref 6.0–8.3)

## 2011-01-08 LAB — CBC
HCT: 49.4 % — ABNORMAL HIGH (ref 36.0–46.0)
Hemoglobin: 16 g/dL — ABNORMAL HIGH (ref 12.0–15.0)
MCH: 25.4 pg — ABNORMAL LOW (ref 26.0–34.0)
MCHC: 32.4 g/dL (ref 30.0–36.0)
MCV: 78.4 fL (ref 78.0–100.0)
Platelets: 595 10*3/uL — ABNORMAL HIGH (ref 150–400)
RBC: 6.3 MIL/uL — ABNORMAL HIGH (ref 3.87–5.11)
RDW: 15.9 % — ABNORMAL HIGH (ref 11.5–15.5)
WBC: 8 10*3/uL (ref 4.0–10.5)

## 2011-01-08 LAB — TSH: TSH: 2.405 u[IU]/mL (ref 0.350–4.500)

## 2011-01-08 LAB — POCT I-STAT TROPONIN I: Troponin i, poc: 0 ng/mL (ref 0.00–0.08)

## 2011-01-08 LAB — TROPONIN I: Troponin I: <0.3 ng/mL (ref <0.30)

## 2011-01-08 LAB — APTT: aPTT: 38 seconds — ABNORMAL HIGH (ref 24–37)

## 2011-01-08 NOTE — Telephone Encounter (Signed)
I talked with pt. Pt states she had significant fatigue on Friday-she thinks she passed out or went to sleep. This morning she woke up with chest tightness and SOB. Pt states the chest tightness is better but she continues to be SOB. I advised pt to call 911. Pt states she will get someone to take her to [facility]  ED for evaluation. I advised pt not to drive.

## 2011-01-08 NOTE — Telephone Encounter (Addendum)
Pt having chest tightness, took three asa felt better, some sob, Friday had fatigue, and either fell asleep or passed out for about an hour, felt fine afterward  Or cell [phone]

## 2011-01-09 LAB — CBC
HCT: 46.4 % — ABNORMAL HIGH (ref 36.0–46.0)
Hemoglobin: 14.9 g/dL (ref 12.0–15.0)
MCH: 25.3 pg — ABNORMAL LOW (ref 26.0–34.0)
MCHC: 32.1 g/dL (ref 30.0–36.0)
MCV: 78.6 fL (ref 78.0–100.0)
Platelets: 510 10*3/uL — ABNORMAL HIGH (ref 150–400)
RBC: 5.9 MIL/uL — ABNORMAL HIGH (ref 3.87–5.11)
RDW: 15.7 % — ABNORMAL HIGH (ref 11.5–15.5)
WBC: 7.3 10*3/uL (ref 4.0–10.5)

## 2011-01-09 LAB — LIPID PANEL
Cholesterol: 192 mg/dL (ref 0–200)
HDL: 53 mg/dL (ref >39)
LDL Cholesterol: 124 mg/dL — ABNORMAL HIGH (ref 0–99)
Total CHOL/HDL Ratio: 3.6 RATIO
Triglycerides: 77 mg/dL (ref <150)
VLDL: 15 mg/dL (ref 0–40)

## 2011-01-09 LAB — COMPREHENSIVE METABOLIC PANEL
ALT: 9 U/L (ref 0–35)
AST: 16 U/L (ref 0–37)
Albumin: 3.4 g/dL — ABNORMAL LOW (ref 3.5–5.2)
Alkaline Phosphatase: 85 U/L (ref 39–117)
BUN: 10 mg/dL (ref 6–23)
CO2: 32 mEq/L (ref 19–32)
Calcium: 9.6 mg/dL (ref 8.4–10.5)
Chloride: 103 mEq/L (ref 96–112)
Creatinine, Ser: 0.66 mg/dL (ref 0.50–1.10)
GFR calc Af Amer: >60 mL/min (ref >60)
GFR calc non Af Amer: >60 mL/min (ref >60)
Glucose, Bld: 107 mg/dL — ABNORMAL HIGH (ref 70–99)
Potassium: 4.5 mEq/L (ref 3.5–5.1)
Sodium: 139 mEq/L (ref 135–145)
Total Bilirubin: 1.2 mg/dL (ref 0.3–1.2)
Total Protein: 6.3 g/dL (ref 6.0–8.3)

## 2011-01-09 LAB — CARDIAC PANEL(CRET KIN+CKTOT+MB+TROPI)
CK, MB: 3.3 ng/mL (ref 0.3–4.0)
Relative Index: INVALID (ref 0.0–2.5)
Total CK: 42 U/L (ref 7–177)
Troponin I: <0.3 ng/mL (ref <0.30)

## 2011-01-09 NOTE — H&P (Addendum)
NAMESHADDAI, SHAPLEY             ACCOUNT NO.:  [id number]  MEDICAL RECORD NO.:  [id number]  LOCATION:  3714                         FACILITY:  [facility]  PHYSICIAN:  Madolyn Frieze. Jens Som, MD, FACCDATE OF BIRTH:  02-27-1930  DATE OF ADMISSION:  01/08/2011 DATE OF DISCHARGE:                             HISTORY & PHYSICAL   PRIMARY CARDIOLOGIST:  Marca Ancona, MD  PRIMARY MEDICAL DOCTOR:  Willow Ora, MD  CHIEF COMPLAINT:  Chest pain.  HISTORY OF PRESENT ILLNESS:  Ms. Brummet is a 77 year old female with a history of cardiomyopathy and nonobstructive CAD by last cath in 2003. Per report, she had an MI in 2003 with a low EF of 40%, initially thought to be related to LAD occlusion that spontaneously recanalized; however, was later suspected that she probably had a stress takotsubo cardiomyopathy.  Last EF was 60-65% in January 2012.  She also has history of migraine headaches, CVA, GERD, and hiatal hernia.  She underwent preoperative evaluation with nuclear stress test in February 2012 in preparation for knee surgery.  She has been doing fairly well since that evaluation.  However, this morning, she awakened at 4 o'clock in the morning with central chest pressure.  She took her blood pressure, which was 151/107.  After about 20 minutes of lying in bed, she took three aspirin and finally after 1 hour, the pain gradually went away.  She has never had this sensation before.  She had a different sensation approximately a month ago, which she attributed to gas pain and antacids did not help.  These symptoms are unlike her prior heart symptoms, which were more to her shoulders back in 2003.  Her chest pain was aggravated by nothing, and brought on by nothing in particular, and was relieved spontaneously.  She does water aerobics two times per week and went to her session on Thursday with no chest pain or shortness of breath.  She believes she may have had some mild shortness of breath this  morning after her pain.  No other associated symptoms including nausea or vomiting.  When asked if she has had any episodes of syncope, she states that last Friday, she became so exhausted after working in the house, so she laid on the floor to take a quick nap and is unsure if she passed out or just fell asleep.  She woke up 1 hour later and felt fine.  She denies any presyncopal symptoms including chest pain, shortness of breath, or palpitations.  Of note, her troponin is negative x1 at point-of-care.  There is a question of pancytosis as her hemoglobin is 16 and platelet count is 595.  These were also noted to be elevated in prior lab work as well.  PAST MEDICAL HISTORY: 1. Mild nonobstructive CAD by cath in 2003, see above for description     of prior coronary artery disease. 2. Cardiomyopathy with EF of 40% in 2003, most recently of 60-65% in     January 2012 with mild focal basal hypertrophy and grade 2     diastolic dysfunction. 3. Migraine headaches. 4. Skin cancer. 5. Hypertension. 6. CVA. 7. Degenerative joint disease, status post left TKA in February 2012. 8. GI issues  including GERD, hiatal hernia, hemorrhoid, hyperplastic     colon polyps, and diverticuli. 9. Vertebral compression fractures. 10.Obstructive sleep apnea. 11.Right lower extremity deep venous thrombosis remotely after venous     ablation. 12.Negative nuclear stress test, February 2012. 13.Status post tonsillectomy. 14.Status post back surgery. 15.Tubal ligation. 16.Status post kyphoplasty. 17.Status post appendectomy.  MEDICATIONS: 1. Metoprolol 50 mg b.i.d. 2. Lorazepam 0.5 mg t.i.d. p.r.n. 3. Aspirin 81 mg daily. 4. Lasix 20 mg p.r.n. 5. Tylenol p.r.n.  ALLERGIES:  PENICILLIN caused blister on her mouth.  MAGNESIUM is a migraine trigger.  TOPAMAX causes itching.  SOCIAL HISTORY:  Ms. Sotero is widowed.  She lives in a townhouse with her daughter and grandson.  She quit smoking in 1990 and  drinks approximately 1 glass of wine per night.  FAMILY HISTORY:  Her father died at age 31 of an MI.  Family history is also positive for hypertension.  REVIEW OF SYSTEMS:  No fevers, chills, nausea, vomiting, diarrhea.  She has occasional palpitations.  All other systems reviewed and otherwise negative.  LABORATORY DATA:  WBC 8, hemoglobin 18, hematocrit 49.4, platelet count 595.  Sodium 140, potassium 4.3, chloride 101, glucose 105, BUN 8, creatinine 1.  Cardiac enzymes negative x1.  EKG normal sinus rhythm with nonspecific ST-T changes.  RADIOLOGIC FINDINGS:  Chest x-ray showed no acute findings.  Moderate hiatal hernia.  PHYSICAL EXAMINATION:  VITAL SIGNS:  Pulse 62, respirations 14, blood pressure 154/82, pulse ox 97% on room air. GENERAL:  This is a pleasant white female in no acute distress, well appearing. HEENT:  Normocephalic, atraumatic with extraocular movements intact. Clear sclerae.  Nares are without discharge. NECK:  Supple without carotid bruits. HEART:  Auscultation to the heart reveals regular rate and rhythm with S1 and S2 without murmurs, rubs, or gallops. LUNGS:  Clear to auscultation bilaterally without wheezes, rales, or rhonchi. ABDOMEN:  Soft, nontender, nondistended.  Positive bowel sounds.  No rebound or guarding. EXTREMITIES:  Warm and dry without edema.  She has 2+ pedal pulses bilaterally. NEUROLOGIC:  She is alert and oriented x3, responds to questions appropriately with a normal affect.  ASSESSMENT AND PLAN:  The patient was seen and examined by Dr. Jens Som and myself.  This is a 77 year old lady with history of mild nonobstructive coronary artery disease by cath in 2003 with question of takotsubo cardiomyopathy in 2003 with most recently normal ejection fraction, as well as migraine headaches and reported history of apnea and cerebrovascular accident.  She presents to the [facility] with complaints of chest pain awakening her  early this morning, diffuse, not pleuritic, not positional, not related to food, and nonexertional. It resolved spontaneously.  She did have some recurrent pain in the epigastric area after walking to the bathroom, but is now painfree.  Her chest pain is of unclear etiology at this point.  We will check LFTs. EKG demonstrates no acute changes.  We will cycle cardiac enzymes and check serial EKG in the morning.  If her enzymes remain negative and LFTs are normal, we will plan for an outpatient Myoview.  We will also initiate Norvasc for improved blood pressure control.  Given her elevated hemoglobin and platelet count, we would also recommend an outpatient heme evaluation or referral back to primary care provider for further workup.     Dayna Dunn, P.A.C.   ______________________________ Madolyn Frieze. Jens Som, MD, [facility]    DD/MEDQ  D:  01/08/2011  T:  01/09/2011  Job:  [id number]  cc:  Marca Ancona, MD Willow Ora, MD  Electronically Signed by Ronie Spies  on 01/09/2011 07:07:57 PM Electronically Signed by Olga Millers MD [facility] on 01/10/2011 03:26:29 PM

## 2011-01-11 ENCOUNTER — Other Ambulatory Visit (HOSPITAL_COMMUNITY): Payer: Self-pay | Admitting: Cardiology

## 2011-01-12 ENCOUNTER — Ambulatory Visit (HOSPITAL_COMMUNITY): Payer: Medicare Other | Attending: Internal Medicine | Admitting: Radiology

## 2011-01-12 DIAGNOSIS — I079 Rheumatic tricuspid valve disease, unspecified: Secondary | ICD-10-CM | POA: Insufficient documentation

## 2011-01-12 DIAGNOSIS — I10 Essential (primary) hypertension: Secondary | ICD-10-CM | POA: Insufficient documentation

## 2011-01-12 DIAGNOSIS — E785 Hyperlipidemia, unspecified: Secondary | ICD-10-CM | POA: Insufficient documentation

## 2011-01-12 DIAGNOSIS — I2510 Atherosclerotic heart disease of native coronary artery without angina pectoris: Secondary | ICD-10-CM | POA: Insufficient documentation

## 2011-01-12 DIAGNOSIS — R072 Precordial pain: Secondary | ICD-10-CM

## 2011-01-16 ENCOUNTER — Encounter: Payer: Self-pay | Admitting: Cardiology

## 2011-01-16 ENCOUNTER — Ambulatory Visit (INDEPENDENT_AMBULATORY_CARE_PROVIDER_SITE_OTHER): Payer: Medicare Other | Admitting: Cardiology

## 2011-01-16 DIAGNOSIS — E785 Hyperlipidemia, unspecified: Secondary | ICD-10-CM

## 2011-01-16 DIAGNOSIS — R079 Chest pain, unspecified: Secondary | ICD-10-CM | POA: Insufficient documentation

## 2011-01-16 DIAGNOSIS — I10 Essential (primary) hypertension: Secondary | ICD-10-CM

## 2011-01-16 DIAGNOSIS — G459 Transient cerebral ischemic attack, unspecified: Secondary | ICD-10-CM

## 2011-01-16 MED ORDER — AMLODIPINE BESYLATE 5 MG PO TABS: 5.0000 mg | ORAL_TABLET | Freq: Every day | ORAL | Status: DC

## 2011-01-16 MED ORDER — METOPROLOL TARTRATE 25 MG PO TABS: 25.0000 mg | ORAL_TABLET | Freq: Two times a day (BID) | ORAL | Status: DC

## 2011-01-16 MED ORDER — PRAVASTATIN SODIUM 20 MG PO TABS: 20.0000 mg | ORAL_TABLET | Freq: Every evening | ORAL | Status: DC

## 2011-01-16 NOTE — Assessment & Plan Note (Signed)
Blood pressure is still high.  She has not filled amlodipine and worries that taking 50 mg bid metoprolol will make her too fatigued.  I will let her stay with 25 mg bid metoprolol.  She will go ahead and start amlodipine 5 mg daily.

## 2011-01-16 NOTE — Assessment & Plan Note (Signed)
Recent hospitalization with atypical chest pain.  I think that this was likely non-cardiac and may have been related to GERD.  Negative myoview in 2/12.

## 2011-01-16 NOTE — Assessment & Plan Note (Signed)
I worry that the patient's episode of diplopia could have been a TIA.  I will have her get carotid dopplers to assess for carotid stenosis.  She should start on pravastatin 20 mg with goal LDL at least < 100 and ideally < 70.

## 2011-01-16 NOTE — Patient Instructions (Signed)
Decrease metoprolol to 25mg  twice a day. You can take one-half of a metoprolol  50mg  tablet twice a day.  Start Pravachol 20mg  daily in the evening.  Take coenzyme Q10 200mg  daily.  Start Amlodipine(norvasc) 5mg  daily.  Your physician has requested that you have a carotid duplex. This test is an ultrasound of the carotid arteries in your neck. It looks at blood flow through these arteries that supply the brain with blood. Allow one hour for this exam. There are no restrictions or special instructions.    Your physician recommends that you return for a FASTING lipid profile/liver profile in 2 months--786.50  435.9  Your physician recommends that you schedule a follow-up appointment in: 3 months with Dr Shirlee Latch.

## 2011-01-16 NOTE — Progress Notes (Signed)
PCP: Dr. Drue Novel  77 yo with history of HTN, prior stroke, and prior cardiomyopathy presents for cardiology followup.  She had an episode of severe chest pain in 1994 and had a heart catheterization at that time that per her report showed no heart artery blockages. In 2003, she had another episode of sudden onset chest and back pain. She does not remember what triggered the episode. She had extensive T wave inversions on ECG. Left heart cath showed nonobstructive CAD but left ventriculogram showed periapical severe hypokinesis with basal hyperkinesis. At the time, it was thought that she had had a large MI from LAD occlusion with spontaneous recanalization of the vessel. However, I suspect that she probably had a stress (Takotsubo) cardiomyopathy as her ECG and left heart cath fit this picture fairly well.  In 2/12, Lexiscan myoview was negative for ischemia or infarction.  In 8/12, echo showed normal EF.   Patient was admitted 01/09/11 with chest pain.  She woke up at 4 am that morning with severe chest pain.  This resolved in 1 hour. She came to the hospital where ECG was unremarkable and cardiac enzymes were negative.  She was discharged the next day. Since discharge, no further chest pain.  She is fairly active and does yardwork without exertional dyspnea.  No problem with moderate walking.  She does report an episode of double vision that actually occurred today while she was driving to this appointment.  It resolved in < 1 minute.    BP has been running high.  It was 158/84.  She was given a prescription for amlodipine in the hospital but has not filled it.  She was also asked to increase her metoprolol to 50 mg bid, but she is still taking 25 mg bid.   ECG: NSR, Qs in V1 and V2 are nonspecific   Labs (3/11): K 3.9, creatinine 0.8, normal TSH  Labs (8/12): K 4.5, creatinine 0.66, LDL 124, HDL 53  1) ! Pcn  2) ! Asa  3) ! Accupril  4) ! * Toprol  5) ! Lescol  6) ! Zocor  7) ! Lipitor  8) ! *  Zetia  9) ! * Crestor  10) ! Milk of Magnesia (Magnesium Hydroxide)  11) ! Diovan   Past Medical History:  1. Headache - migraines  2. Hypertension  3. Hyperlipidemia: GI upset with atorvastatin, simvastatin, and Crestor 4. Osteoporosis  5. Coronary artery disease: Mild nonobstructive disease on cath in 2003.  6 CVA  7. GI issues: GERD Hiatal hernia, Hemorrhoids, Hyperplastic colon polyp, 06/2001, Functional LLQ pain, h/o diverticuli  8. Obstructive sleep apnea  9. varicose veins  10. h/o R leg DVT after a venous ablation  11. OA with bilateral knee pain  12. 4-09 L2 fracture status post vertebroplasty of L2 September 10, 2007 performed by IR  13. Depression/anxiety -lost husband  107. Probable Takotsubo cardiomyopathy: Severe chest pain with normal cath in 1994. Severe chest pain in 2003 with widespread T wave inversions on ECG. Cath with minimal coronary disease but LV-gram showed periapical severe hypokinesis and basilar hyperkinesia (EF 40%). Echo in 4/09 confirmed full LV functional recovery with EF 60%, no regional wall motion abnormalities, mild to moderate LVH.  Lexiscan myoview in 2/12 showed no ischemia or infarction.  Last echo was in 8/12 and showed EF 55-60%, mild focal basal septal hypertrophy, grade I diastolic dysfunction, mild basal posterolateral hypokinesis.  Family History:  colon polyps--cousin  colon ca-- cousin  Family History of Breast  Cancer: Mother, Sister  Family History of Heart Disease, MI s: Father  Mother Multiple Myeloma   Social History:  Married, lost husband 07-29-07. Lives in [place].  lives by self  driving  Former Smoker: quit 1990  Alcohol use-yes  children x 3 ( 2 in [place] , see them occasionally)  Daily Caffeine Use coffee   Review of Systems  All systems reviewed and negative except as per HPI.   Current Outpatient Prescriptions  Medication Sig Dispense Refill  . aspirin 81 MG tablet Take 81 mg by mouth daily.        . Calcium Carbonate  Antacid (TUMS ULTRA 1000 PO) Take by mouth.        . cholecalciferol (VITAMIN D) 1000 UNITS tablet Take 1,000 Units by mouth daily.        Marland Kitchen docusate sodium (COLACE) 100 MG capsule Take 100 mg by mouth daily.        . furosemide (LASIX) 20 MG tablet Take 20 mg by mouth daily as needed.        Marland Kitchen LORazepam (ATIVAN) 0.5 MG tablet Take 0.5 mg by mouth 3 (three) times daily as needed.        . LUTEIN PO Take 1 tablet by mouth daily.        Marland Kitchen omeprazole (PRILOSEC) 40 MG capsule Take 40 mg by mouth daily.        . vitamin E 400 UNIT capsule Take 400 Units by mouth daily.        Marland Kitchen amLODipine (NORVASC) 5 MG tablet Take 1 tablet (5 mg total) by mouth daily.  30 tablet  6  . Coenzyme Q10 200 MG capsule Take 1 capsule (200 mg total) by mouth daily.      . metoprolol tartrate (LOPRESSOR) 25 MG tablet Take 1 tablet (25 mg total) by mouth 2 (two) times daily.  60 tablet  6  . pravastatin (PRAVACHOL) 20 MG tablet Take 1 tablet (20 mg total) by mouth every evening.  30 tablet  3    BP 158/84  Pulse 67  Resp 14  Ht 5\' 7"  (1.702 m)  Wt 164 lb (74.39 kg)  BMI 25.69 kg/m2 General: NAD Neck: No JVD, no thyromegaly or thyroid nodule.  Lungs: Clear to auscultation bilaterally with normal respiratory effort. CV: Nondisplaced PMI.  Heart regular S1/S2, no S3/S4, no murmur.  No peripheral edema.  No carotid bruit.  Normal pedal pulses.  Bilateral varicose veins.  Abdomen: Soft, nontender, no hepatosplenomegaly, no distention.  Neurologic: Alert and oriented x 3.  Psych: Normal affect. Extremities: No clubbing or cyanosis.

## 2011-01-16 NOTE — Assessment & Plan Note (Signed)
Possible TIA.  Would aim for LDL at least < 100 and if vascular disease is confirmed, LDL < 70.  Given GI upset with other statins, I will start her on pravastatin 20 mg daily with lipids/LFTs in 2 months.

## 2011-01-27 NOTE — Discharge Summary (Signed)
NAMECANDE, MASTROPIETRO NO.:  [id number]  MEDICAL RECORD NO.:  [id number]  LOCATION:  3714                         FACILITY:  [facility]  PHYSICIAN:  Marca Ancona, MD      DATE OF BIRTH:  1930-05-11  DATE OF ADMISSION:  01/08/2011 DATE OF DISCHARGE:  01/09/2011                              DISCHARGE SUMMARY   PROCEDURES:  Two-view chest x-ray.  PRIMARY FINAL DISCHARGE DIAGNOSIS:  Chest pain, cardiac enzymes negative for myocardial infarction, and outpatient follow-up plan.  SECONDARY DIAGNOSES: 1. Hypertension. 2. Dyslipidemia with a total cholesterol of 192, triglycerides 77, HDL     53, LDL 124. 3. Status post cardiac catheterization in 2003 (done for abnormal     echocardiogram) showing left main 25, LAD irregularities,     circumflex 25%, RCA 25%, EF 45%-50%. 4. History of vertebral fracture. 5. Obstructive sleep apnea. 6. Status post left total knee replacement. 7. History of cerebrovascular accident. 8. History of skin cancer. 9. Degenerative joint disease. 10.Gastroesophageal reflux disease. 11.History of hiatal hernia. 12.History of hemorrhoids and hyperplastic colon polyps with     functional left lower quadrant pain and diverticula. 13.Status post stress test in February 2012 that was negative for     ischemia. 14.History of right lower extremity DVT after venous ablation. 15.History of migraines. 16.Status post tonsillectomy, back surgery, tubal ligation,     kyphoplasty, and appendectomy.  TIME AT DISCHARGE:  34 minutes.  HOSPITAL COURSE:  [name] is a 77 year old female with a history of nonobstructive coronary artery disease, who was admitted for chest pain on January 08, 2011.  Her chest pain resolved.  She was continued on her home medications and had Protonix added to her medication regimen.  Her hemoglobin was initially elevated at 16 with a hematocrit of 49.4.  A repeat the next day showed a hemoglobin of 14.9 with hematocrit of  46.4, WBC 5.9, RDW 15.7, and platelets 510.  Her MCH was 25.3.  Other labs were within normal limits except for a lipid profile showing an LDL of 124.  TSH was within normal limits.  A chest x-ray showed no acute disease.  On January 09, 2011, [name] was evaluated by Dr. Shirlee Latch.  Her chest pain had been atypical and she had a normal Myoview in February 2012.  He felt that her symptoms may be secondary to reflux.  He felt that since she was ambulating without chest pain or shortness of breath, she was stable for discharge, to follow up as an outpatient.  DISCHARGE INSTRUCTIONS:  Her activity level is to be increased gradually.  She is encouraged to stick to a low-sodium heart-healthy diet.  She is to get an echocardi[facility] at [facility] on August 31 at 9:30 and follow up with Dr. Shirlee Latch on January 16, 2011 at 10 a.m.  She is to follow up with Dr. Drue Novel as needed.  DISCHARGE MEDICATIONS: 1. Lorazepam 0.5 mg b.i.d. 2. Lopressor 25 mg is discontinued. 3. Lopressor 50 mg b.i.d. 4. Amlodipine 5 mg daily. 5. Lasix 20 mg daily p.r.n. 6. Tylenol with Codeine q.6 h. p.r.n. 7. Aspirin 81 mg a day. 8. Omeprazole 40 mg daily. 9. Lutein 20 mg daily. 10.Vitamin  E 400 mg daily.     Theodore Demark, PA-C   ______________________________ Marca Ancona, MD    RB/MEDQ  D:  01/09/2011  T:  01/09/2011  Job:  [id number]  cc:   Dawn Ora, MD  Electronically Signed by Theodore Demark PA-C on 01/16/2011 02:03:19 PM Electronically Signed by Marca Ancona MD on 01/27/2011 11:20:44 PM

## 2011-01-31 ENCOUNTER — Other Ambulatory Visit: Payer: Self-pay | Admitting: Internal Medicine

## 2011-01-31 NOTE — Telephone Encounter (Signed)
Ok 90 and 1 RF 

## 2011-02-01 NOTE — Telephone Encounter (Signed)
Faxed script back to [facility] @ [address] @ [phone].Marland KitchenMarland Kitchen9/20/12@8 :35am/LMB

## 2011-02-05 ENCOUNTER — Encounter: Payer: Medicare Other | Admitting: *Deleted

## 2011-02-06 LAB — DIFFERENTIAL
Basophils Absolute: 0.1
Basophils Relative: 1
Eosinophils Absolute: 0.1
Eosinophils Relative: 2
Lymphocytes Relative: 16
Lymphs Abs: 1.3
Monocytes Absolute: 0.8
Monocytes Relative: 10
Neutro Abs: 5.8
Neutrophils Relative %: 71

## 2011-02-06 LAB — TROPONIN I: Troponin I: 0.01

## 2011-02-06 LAB — COMPREHENSIVE METABOLIC PANEL
ALT: 13
AST: 17
Albumin: 3.4 — ABNORMAL LOW
Alkaline Phosphatase: 92
BUN: 7
CO2: 30
Calcium: 9.2
Chloride: 104
Creatinine, Ser: 0.79
GFR calc Af Amer: >60
GFR calc non Af Amer: >60
Glucose, Bld: 107 — ABNORMAL HIGH
Potassium: 4.2
Sodium: 138
Total Bilirubin: 1
Total Protein: 6

## 2011-02-06 LAB — POCT I-STAT 3, ART BLOOD GAS (G3+)
Acid-Base Excess: 2
Bicarbonate: 25.5 — ABNORMAL HIGH
O2 Saturation: 93
Operator id: 251151
Patient temperature: 96.6
TCO2: 27
pCO2 arterial: 34 — ABNORMAL LOW
pH, Arterial: 7.479 — ABNORMAL HIGH
pO2, Arterial: 59 — ABNORMAL LOW

## 2011-02-06 LAB — POCT I-STAT, CHEM 8
BUN: 11
Calcium, Ion: 1.2
Chloride: 100
Creatinine, Ser: 1
Glucose, Bld: 117 — ABNORMAL HIGH
HCT: 53 — ABNORMAL HIGH
Hemoglobin: 18 — ABNORMAL HIGH
Potassium: 4.1
Sodium: 136
TCO2: 27

## 2011-02-06 LAB — CBC
HCT: 48.8 — ABNORMAL HIGH
HCT: 50 — ABNORMAL HIGH
Hemoglobin: 16.1 — ABNORMAL HIGH
Hemoglobin: 16.2 — ABNORMAL HIGH
MCHC: 32.5
MCHC: 33.1
MCV: 85
MCV: 86.2
Platelets: 503 — ABNORMAL HIGH
Platelets: 587 — ABNORMAL HIGH
RBC: 5.65 — ABNORMAL HIGH
RBC: 5.88 — ABNORMAL HIGH
RDW: 13.8
RDW: 14.1
WBC: 8.1
WBC: 9.7

## 2011-02-06 LAB — CARDIAC PANEL(CRET KIN+CKTOT+MB+TROPI)
CK, MB: 1.2
CK, MB: 1.3
Relative Index: INVALID
Relative Index: INVALID
Total CK: 31
Total CK: 36
Troponin I: 0.01
Troponin I: 0.01

## 2011-02-06 LAB — CK TOTAL AND CKMB (NOT AT ARMC)
CK, MB: 1.4
Relative Index: INVALID
Total CK: 46

## 2011-02-06 LAB — D-DIMER, QUANTITATIVE: D-Dimer, Quant: 0.43

## 2011-02-06 LAB — POCT CARDIAC MARKERS
CKMB, poc: 1.5
Myoglobin, poc: 42.1
Operator id: 294341
Troponin i, poc: <0.05

## 2011-02-06 LAB — PROTIME-INR
INR: 1
Prothrombin Time: 13.7

## 2011-02-06 LAB — B-NATRIURETIC PEPTIDE (CONVERTED LAB): Pro B Natriuretic peptide (BNP): <30

## 2011-02-07 ENCOUNTER — Encounter: Payer: Self-pay | Admitting: Internal Medicine

## 2011-02-07 ENCOUNTER — Ambulatory Visit (INDEPENDENT_AMBULATORY_CARE_PROVIDER_SITE_OTHER): Payer: Medicare Other | Admitting: Internal Medicine

## 2011-02-07 DIAGNOSIS — E785 Hyperlipidemia, unspecified: Secondary | ICD-10-CM

## 2011-02-07 DIAGNOSIS — G459 Transient cerebral ischemic attack, unspecified: Secondary | ICD-10-CM

## 2011-02-07 DIAGNOSIS — I10 Essential (primary) hypertension: Secondary | ICD-10-CM

## 2011-02-07 DIAGNOSIS — R079 Chest pain, unspecified: Secondary | ICD-10-CM

## 2011-02-07 MED ORDER — NIACIN ER (ANTIHYPERLIPIDEMIC) 500 MG PO TBCR: 500.0000 mg | EXTENDED_RELEASE_TABLET | Freq: Every day | ORAL | Status: DC

## 2011-02-07 MED ORDER — ASPIRIN EC 325 MG PO TBEC: 325.0000 mg | DELAYED_RELEASE_TABLET | Freq: Every day | ORAL | Status: AC

## 2011-02-07 NOTE — Assessment & Plan Note (Signed)
Intolerant to most medications including zetia, recent trial  with Pravachol failed . Trial with Niaspan, precautions discussed

## 2011-02-07 NOTE — Assessment & Plan Note (Signed)
Resolved

## 2011-02-07 NOTE — Assessment & Plan Note (Addendum)
BP today well controlled despite not taking amlodipine (?intolerant, had  Headaches). See instructions

## 2011-02-07 NOTE — Progress Notes (Signed)
Subjective:    Patient ID: [name], female    DOB: Jul 29, 1929, 77 y.o.   MRN: [id number]  HPI Hospital followup, was admitted last month. Had chest pain, cardiac enzymes negative, was evaluated by cardiology. She was discharged with the same medications plus Protonix. Labs reviewed: BMP, CBC, LFTs normal LDL 124 TSH normal Subsequently she saw cardiology 01-16-11, started on pravastatin amlodipine  Past Medical History  Diagnosis Date  . Headache     migraines  . Hypertension   . Hyperlipidemia   . Osteoporosis   . CAD (coronary artery disease)     mild nonobstructive disease on cath in 2003  . CVA (cerebral infarction)   . GERD (gastroesophageal reflux disease)   . Hiatal hernia   . Hemorrhoids   . Hyperplastic colon polyp 06/2001  . Diverticulitis   . OSA (obstructive sleep apnea)   . Varicose vein   . DVT (deep venous thrombosis)     after venous ablation, R leg  . OA (osteoarthritis) of knee     w/ bilateral knee pain  . Fracture 09/10/07    L2, status post vertebroplasty of L2 performed by IR  . Depression with anxiety     lost husband  . Cardiomyopathy     Probable Takotsubo, severe CP w/ normal cath in 1994. Severe CP in 2003 w/ widespread T wave inversions on ECG. Cath w/ minimal coronary disease but LV-gram showed periapical severe hypokinesis and basilar hyperkinesia (EF 40%). Last echo in 4/09 confirmed full LV functional recovery with EF 60%, no regional wall motion abnormalities, mild to moderate LVH.   Past Surgical History  Procedure Date  . Appendectomy   . Ovarian cyst surgery     left  . Tonsillectomy   . Back surgery   . Tubal ligation   . Dental extraction     L maxillary molar  . Kyphosis surgery 08/2007  . Total knee arthroplasty 06/2010    left   History   Social History  . Marital Status: Widowed    Spouse Name: N/A    Number of Children: N/A  . Years of Education: N/A   Occupational History  . Not on file.   Social History  Main Topics  . Smoking status: Former Smoker    Quit date: 05/14/1988  . Smokeless tobacco: Not on file  . Alcohol Use: Yes  . Drug Use: No  . Sexually Active: Not on file   Other Topics Concern  . Not on file   Social History Narrative   Lost husband 07/29/07---Lives in [place] in a town house, her daughter moved in w/ herDrivingGoes to the [facility] sometimes      Review of Systems Developed headaches, patient thinks related to pravastatin and amlodipine. The headache persisted after she took them  separately. Since she discontinued both meds, the headache is resolved . Ambulatory blood pressures even without amlodipine are around 110/70 with a pulse of 69 a. No further CP Also had an episode of diplopia and "feeling funny"  on 01-16-11. Episode lasted 30 seconds, was not associated with headache or dizziness, the patient was by herself and does not know if she had slurred speech. No further episodes       Objective:   Physical Exam  Constitutional: She is oriented to person, place, and time. She appears well-developed and well-nourished.  HENT:  Head: Normocephalic and atraumatic.  Neck:       Normal carotid pulses, no bruit  Cardiovascular: Normal rate,  regular rhythm, normal heart sounds and intact distal pulses.   No murmur heard. Pulmonary/Chest: Effort normal and breath sounds normal. No respiratory distress. She has no wheezes. She has no rales.  Musculoskeletal: She exhibits no edema.  Neurological: She is alert and oriented to person, place, and time.       Face symmetric, speech fluent, normal memory, EOMI  Psychiatric: She has a normal mood and affect. Her behavior is normal. Judgment and thought content normal.          Assessment & Plan:

## 2011-02-07 NOTE — Patient Instructions (Addendum)
Increase aspirin to 325 mg one tablet a day, 30 minutes before Niaspan, take it with a snack, watch for stomach irritation. Try Niaspan one at bedtime,30 minutes after your aspirin and a snack Check the  blood pressure daily , be sure it is less than 135/85. If it is consistently higher, let me know Call if double vision resurface

## 2011-02-07 NOTE — Assessment & Plan Note (Signed)
Few days ago had symptoms suspicious for a TIA. Carotid ultrasound pending. She had an MRI though last year at neurology, report not available. Plan is to control her cardiovascular risk factors, unfortunately we are unable to treat her cholesterol. Increase aspirin to 325, watch for GI side effects

## 2011-02-21 ENCOUNTER — Encounter (INDEPENDENT_AMBULATORY_CARE_PROVIDER_SITE_OTHER): Payer: Medicare Other | Admitting: Cardiology

## 2011-02-21 DIAGNOSIS — H53129 Transient visual loss, unspecified eye: Secondary | ICD-10-CM

## 2011-02-21 DIAGNOSIS — G459 Transient cerebral ischemic attack, unspecified: Secondary | ICD-10-CM

## 2011-02-21 DIAGNOSIS — I6529 Occlusion and stenosis of unspecified carotid artery: Secondary | ICD-10-CM

## 2011-02-25 ENCOUNTER — Emergency Department (HOSPITAL_COMMUNITY)
Admission: EM | Admit: 2011-02-25 | Discharge: 2011-02-25 | Disposition: A | Discharge status: Home / Self Care | Payer: Medicare Other | Attending: Emergency Medicine | Admitting: Emergency Medicine

## 2011-02-25 ENCOUNTER — Emergency Department (HOSPITAL_COMMUNITY): Payer: Medicare Other

## 2011-02-25 DIAGNOSIS — Z79899 Other long term (current) drug therapy: Secondary | ICD-10-CM | POA: Insufficient documentation

## 2011-02-25 DIAGNOSIS — Z8673 Personal history of transient ischemic attack (TIA), and cerebral infarction without residual deficits: Secondary | ICD-10-CM | POA: Insufficient documentation

## 2011-02-25 DIAGNOSIS — K8020 Calculus of gallbladder without cholecystitis without obstruction: Secondary | ICD-10-CM | POA: Insufficient documentation

## 2011-02-25 DIAGNOSIS — Z7982 Long term (current) use of aspirin: Secondary | ICD-10-CM | POA: Insufficient documentation

## 2011-02-25 DIAGNOSIS — Z96659 Presence of unspecified artificial knee joint: Secondary | ICD-10-CM | POA: Insufficient documentation

## 2011-02-25 DIAGNOSIS — M1990 Unspecified osteoarthritis, unspecified site: Secondary | ICD-10-CM | POA: Insufficient documentation

## 2011-02-25 DIAGNOSIS — I428 Other cardiomyopathies: Secondary | ICD-10-CM | POA: Insufficient documentation

## 2011-02-25 DIAGNOSIS — K859 Acute pancreatitis without necrosis or infection, unspecified: Secondary | ICD-10-CM | POA: Insufficient documentation

## 2011-02-25 DIAGNOSIS — I10 Essential (primary) hypertension: Secondary | ICD-10-CM | POA: Insufficient documentation

## 2011-02-25 DIAGNOSIS — R0602 Shortness of breath: Secondary | ICD-10-CM | POA: Insufficient documentation

## 2011-02-25 LAB — DIFFERENTIAL
Basophils Absolute: 0.1 10*3/uL (ref 0.0–0.1)
Basophils Relative: 1 % (ref 0–1)
Eosinophils Absolute: 0.2 10*3/uL (ref 0.0–0.7)
Eosinophils Relative: 2 % (ref 0–5)
Lymphocytes Relative: 9 % — ABNORMAL LOW (ref 12–46)
Lymphs Abs: 1 10*3/uL (ref 0.7–4.0)
Monocytes Absolute: 0.7 10*3/uL (ref 0.1–1.0)
Monocytes Relative: 6 % (ref 3–12)
Neutro Abs: 8.6 10*3/uL — ABNORMAL HIGH (ref 1.7–7.7)
Neutrophils Relative %: 82 % — ABNORMAL HIGH (ref 43–77)

## 2011-02-25 LAB — CBC
HCT: 50.4 % — ABNORMAL HIGH (ref 36.0–46.0)
Hemoglobin: 15.6 g/dL — ABNORMAL HIGH (ref 12.0–15.0)
MCH: 24.5 pg — ABNORMAL LOW (ref 26.0–34.0)
MCHC: 31 g/dL (ref 30.0–36.0)
MCV: 79.2 fL (ref 78.0–100.0)
Platelets: 586 10*3/uL — ABNORMAL HIGH (ref 150–400)
RBC: 6.36 MIL/uL — ABNORMAL HIGH (ref 3.87–5.11)
RDW: 15.1 % (ref 11.5–15.5)
WBC: 10.5 10*3/uL (ref 4.0–10.5)

## 2011-02-25 LAB — BASIC METABOLIC PANEL
BUN: 10 mg/dL (ref 6–23)
CO2: 30 mEq/L (ref 19–32)
Calcium: 9.7 mg/dL (ref 8.4–10.5)
Chloride: 100 mEq/L (ref 96–112)
Creatinine, Ser: 0.63 mg/dL (ref 0.50–1.10)
GFR calc Af Amer: >90 mL/min (ref >90)
GFR calc non Af Amer: 83 mL/min — ABNORMAL LOW (ref >90)
Glucose, Bld: 124 mg/dL — ABNORMAL HIGH (ref 70–99)
Potassium: 3.8 mEq/L (ref 3.5–5.1)
Sodium: 137 mEq/L (ref 135–145)

## 2011-02-25 LAB — HEPATIC FUNCTION PANEL
ALT: 11 U/L (ref 0–35)
AST: 23 U/L (ref 0–37)
Albumin: 3.6 g/dL (ref 3.5–5.2)
Alkaline Phosphatase: 98 U/L (ref 39–117)
Bilirubin, Direct: 0.2 mg/dL (ref 0.0–0.3)
Indirect Bilirubin: 0.6 mg/dL (ref 0.3–0.9)
Total Bilirubin: 0.8 mg/dL (ref 0.3–1.2)
Total Protein: 7 g/dL (ref 6.0–8.3)

## 2011-02-25 LAB — PRO B NATRIURETIC PEPTIDE: Pro B Natriuretic peptide (BNP): 248.8 pg/mL (ref 0–450)

## 2011-02-25 LAB — CK TOTAL AND CKMB (NOT AT ARMC)
CK, MB: 4.5 ng/mL — ABNORMAL HIGH (ref 0.3–4.0)
Relative Index: INVALID (ref 0.0–2.5)
Total CK: 71 U/L (ref 7–177)

## 2011-02-25 LAB — LIPASE, BLOOD: Lipase: 655 U/L — ABNORMAL HIGH (ref 11–59)

## 2011-02-25 LAB — POCT I-STAT TROPONIN I: Troponin i, poc: 0 ng/mL (ref 0.00–0.08)

## 2011-02-28 ENCOUNTER — Telehealth: Payer: Self-pay | Admitting: Cardiology

## 2011-02-28 NOTE — Telephone Encounter (Signed)
I talked with pt about recent carotid results 

## 2011-02-28 NOTE — Telephone Encounter (Signed)
Pt returning call from [place]. Please call back.

## 2011-03-06 ENCOUNTER — Ambulatory Visit (INDEPENDENT_AMBULATORY_CARE_PROVIDER_SITE_OTHER): Payer: Medicare Other | Admitting: General Surgery

## 2011-03-06 ENCOUNTER — Encounter (INDEPENDENT_AMBULATORY_CARE_PROVIDER_SITE_OTHER): Payer: Self-pay | Admitting: General Surgery

## 2011-03-06 VITALS — BP 144/72 | HR 60 | Temp 96.8°F | Resp 20 | Ht 67.0 in | Wt 169.5 lb

## 2011-03-06 DIAGNOSIS — K859 Acute pancreatitis without necrosis or infection, unspecified: Secondary | ICD-10-CM

## 2011-03-06 DIAGNOSIS — K851 Biliary acute pancreatitis without necrosis or infection: Secondary | ICD-10-CM

## 2011-03-06 NOTE — Progress Notes (Signed)
Chief Complaint  Patient presents with  . New Evaluation    eval of GB with stones     HPI [name] is a 77 y.o. female.   HPIShe is referred by Dr. Norlene Campbell from the emergency department because of abdominal pain and gallstones. On February 25, 2011 she had the onset of severe lower chest and upper abdominal pain radiating around the back associated with nausea. She had 2 previous episodes like this but not quite as bad. She has known heart disease but has had heart etiologies rule out during all of these episodes. An abdominal ultrasound was performed on October 14 which demonstrated multiple small gallstones and mild gallbladder wall thickening. Her lipase was significantly elevated her liver function tests were not significantly elevated. Her symptoms improve in the emergency department. She was discharged and sent over here for further evaluation and treatment. There is a family history of gallbladder disease. She had no fever or chills.  Past Medical History  Diagnosis Date  . Headache     migraines  . Hypertension   . Hyperlipidemia   . Osteoporosis   . CAD (coronary artery disease)     mild nonobstructive disease on cath in 2003  . CVA (cerebral infarction)   . GERD (gastroesophageal reflux disease)   . Hiatal hernia   . Hemorrhoids   . Hyperplastic colon polyp 06/2001  . Diverticulitis   . OSA (obstructive sleep apnea)   . Varicose vein   . DVT (deep venous thrombosis)     after venous ablation, R leg  . OA (osteoarthritis) of knee     w/ bilateral knee pain  . Fracture 09/10/07    L2, status post vertebroplasty of L2 performed by IR  . Depression with anxiety     lost husband  . Cardiomyopathy     Probable Takotsubo, severe CP w/ normal cath in 1994. Severe CP in 2003 w/ widespread T wave inversions on ECG. Cath w/ minimal coronary disease but LV-gram showed periapical severe hypokinesis and basilar hyperkinesia (EF 40%). Last echo in 4/09 confirmed full LV functional  recovery with EF 60%, no regional wall motion abnormalities, mild to moderate LVH.  . Gallstones     Past Surgical History  Procedure Date  . Appendectomy   . Ovarian cyst surgery     left  . Tonsillectomy   . Back surgery   . Tubal ligation   . Dental extraction     L maxillary molar  . Kyphosis surgery 08/2007  . Total knee arthroplasty 06/2010    left    Family History  Problem Relation Age of Onset  . Colon polyps      cousin  . Colon cancer      cousin  . Breast cancer Mother   . Breast cancer Sister   . Heart disease Father     MIs  . Leukemia Brother     GM    Social History History  Substance Use Topics  . Smoking status: Former Smoker    Quit date: 05/14/1988  . Smokeless tobacco: Not on file  . Alcohol Use: Yes    Allergies  Allergen Reactions  . Atorvastatin     REACTION: diarrhea  . Ezetimibe     REACTION: nausea  . Fluvastatin Sodium   . Magnesium Hydroxide     REACTION: triggers HAs  . Penicillins   . Quinapril Hcl   . Rosuvastatin     REACTION: jaw pain  . Simvastatin  REACTION: diarrhea  . Valsartan     REACTION: toes itch    Current Outpatient Prescriptions  Medication Sig Dispense Refill  . Calcium Carbonate Antacid (TUMS ULTRA 1000 PO) Take by mouth.        . cholecalciferol (VITAMIN D) 1000 UNITS tablet Take 1,000 Units by mouth daily.        Marland Kitchen docusate sodium (COLACE) 100 MG capsule Take 100 mg by mouth daily.        . furosemide (LASIX) 20 MG tablet Take 20 mg by mouth daily as needed.        Marland Kitchen LORazepam (ATIVAN) 0.5 MG tablet TAKE 1 TABLET BY MOUTH THREE TIMES DAILY AS NEEDED  90 tablet  1  . LUTEIN PO Take 1 tablet by mouth daily.        . metoprolol tartrate (LOPRESSOR) 25 MG tablet Take 1 tablet (25 mg total) by mouth 2 (two) times daily.  60 tablet  6  . niacin (NIASPAN) 500 MG CR tablet Take 1 tablet (500 mg total) by mouth at bedtime.  30 tablet  3  . omeprazole (PRILOSEC) 40 MG capsule Take 40 mg by mouth daily.         . vitamin E 400 UNIT capsule Take 400 Units by mouth daily.          Review of Systems Review of Systems  Constitutional: Negative for fever and chills.  HENT: Negative.   Eyes: Positive for visual disturbance.  Respiratory: Positive for shortness of breath.   Cardiovascular: Positive for chest pain.  Gastrointestinal:       Per hpi.  Genitourinary: Negative.   Musculoskeletal: Positive for arthralgias.  Neurological: Negative.   Hematological: Negative.   Psychiatric/Behavioral: Negative.     Blood pressure 144/72, pulse 60, temperature 96.8 F (36 C), resp. rate 20, height 5\' 7"  (1.702 m), weight 169 lb 8 oz (76.885 kg).  Physical Exam Physical Exam  Constitutional: No distress.       Overweight.  HENT:  Head: Normocephalic and atraumatic.  Eyes: Conjunctivae and EOM are normal.  Neck: Neck supple.  Cardiovascular: Normal rate and regular rhythm.   No murmur heard. Pulmonary/Chest: Effort normal and breath sounds normal. She has no wheezes.  Abdominal: Soft. She exhibits no distension and no mass. There is no tenderness.       Lower midline scar.  Musculoskeletal: She exhibits no edema.       Left knee scar.  Lymphadenopathy:    She has no cervical adenopathy.  Neurological: She is alert.  Skin: Skin is warm and dry. No rash noted.  Psychiatric: She has a normal mood and affect. Her behavior is normal.    Data Reviewed ED note with labs, [place].  Lipase was in the 600 range.  Assessment    Gallstone pancreatitis.  Currently asx.  Has some significant comorbidities that seem to be well-controlled.    Plan        I recommended she start on a low-fat diet. I recommend she strongly consider cholecystectomy as I believe this is a good chance of helping her problem.  I have explained the procedure, risks, and aftercare of cholecystectomy.  Risks include but are not limited to bleeding, infection, wound problems, anesthesia, diarrhea, bile leak, injury to common  bile duct/liver/intestine.  She seems to understand.  All questions were answered. She would need cardiac clearance by Dr. Freida Busman prior to surgery.  She will call back if she want to schedule the operation.  Edmon Magid J 03/06/2011, 2:41 PM

## 2011-03-06 NOTE — Patient Instructions (Signed)
You have gallstones and these caused you to have transient pancreatitis.  I recommend a strict lowfat diet and strongly considering gallbladder surgery.  Please call us back if you want to have the surgery.

## 2011-03-07 NOTE — Progress Notes (Signed)
EF preserved on last echo.  Reasonable exercise tolerance.  Should be ok for surgery if no new cardiac symptoms and she is taking metoprolol peri-operatively.   Anee, please check with patient that there have been no cardiac-type changes since I last saw her.   [name] [employer]

## 2011-03-08 NOTE — Progress Notes (Signed)
NA

## 2011-03-08 NOTE — Progress Notes (Signed)
I talked with pt. Pt denies any cardiac type changes or new cardiac  symptoms since last OV with Dr Shirlee Latch 01/16/11. Pt is aware that she should continue metoprolol peri-operatively.

## 2011-03-14 ENCOUNTER — Other Ambulatory Visit: Payer: Self-pay | Admitting: Internal Medicine

## 2011-03-14 NOTE — Telephone Encounter (Signed)
I don't think she has taken this medication in a long time. If she has problems that require codeine , she needs to be seen. arrange of office visit if patient agrees

## 2011-03-14 NOTE — Telephone Encounter (Signed)
Last OV 02/07/11 Last refill 05/12/11

## 2011-03-16 NOTE — Telephone Encounter (Signed)
Spoke w/ pt, who states that she takes the medication prn for migraines and the 90 tablets typically last her one year. Ok to fill, Per Dr.Paz. Pt aware and Rx faxed to pharmacy

## 2011-03-21 ENCOUNTER — Encounter: Payer: Self-pay | Admitting: Internal Medicine

## 2011-03-21 ENCOUNTER — Ambulatory Visit (INDEPENDENT_AMBULATORY_CARE_PROVIDER_SITE_OTHER): Payer: Medicare Other | Admitting: Internal Medicine

## 2011-03-21 VITALS — BP 130/78 | HR 88 | Temp 98.4°F | Ht 67.0 in | Wt 172.0 lb

## 2011-03-21 DIAGNOSIS — Z23 Encounter for immunization: Secondary | ICD-10-CM

## 2011-03-21 DIAGNOSIS — K851 Biliary acute pancreatitis without necrosis or infection: Secondary | ICD-10-CM

## 2011-03-21 DIAGNOSIS — G459 Transient cerebral ischemic attack, unspecified: Secondary | ICD-10-CM

## 2011-03-21 DIAGNOSIS — K859 Acute pancreatitis without necrosis or infection, unspecified: Secondary | ICD-10-CM

## 2011-03-21 NOTE — Assessment & Plan Note (Signed)
Carotid ultrasound was eventually gone last month, mild disease.

## 2011-03-21 NOTE — Assessment & Plan Note (Addendum)
Patient with a history of gallstone pancreatitis, had a mild episode again  5 days ago, currently asymptomatic. She made her mind that she likes to proceed with surgery, has been unable to contact her surgeon. She reports that she was clear he already by her cardiologist. Plan: We'll contact cardiology to be sure she is clear (negative stress test 06-2010) We'll contact surgery and help her schedule the procedure. ADDENDUM: I confirmed that she was cleared by surgery, I contacted Dr. Vevelyn Francois today 03-26-11 and let him know the the patient desires to reschedule the surgery; he will take care of it from his end. Time spent reviewing the chart and coordinating pt care > 25 min [facility]

## 2011-03-21 NOTE — Progress Notes (Signed)
  Subjective:    Patient ID: [name], female    DOB: September 05, 1929, 77 y.o.   MRN: [id number]  HPI [name] to the ER at 02/25/2011 w/ abdominal pain, eventually diagnosed weight cholelithiasis and pancreatitis. On 03/06/2011 sawsurgery, was recommended proceed with a cholecystectomy as long as cardiology clears her.  Past Medical History  Diagnosis Date  . Headache     migraines  . Hypertension   . Hyperlipidemia   . Osteoporosis   . CAD (coronary artery disease)     mild nonobstructive disease on cath in 2003  . CVA (cerebral infarction)   . GERD (gastroesophageal reflux disease)   . Hiatal hernia   . Hemorrhoids   . Hyperplastic colon polyp 06/2001  . Diverticulitis   . OSA (obstructive sleep apnea)   . Varicose vein   . DVT (deep venous thrombosis)     after venous ablation, R leg  . OA (osteoarthritis) of knee     w/ bilateral knee pain  . Fracture 09/10/07    L2, status post vertebroplasty of L2 performed by IR  . Depression with anxiety     lost husband  . Cardiomyopathy     Probable Takotsubo, severe CP w/ normal cath in 1994. Severe CP in 2003 w/ widespread T wave inversions on ECG. Cath w/ minimal coronary disease but LV-gram showed periapical severe hypokinesis and basilar hyperkinesia (EF 40%). Last echo in 4/09 confirmed full LV functional recovery with EF 60%, no regional wall motion abnormalities, mild to moderate LVH.  . Gallstones    Past Surgical History  Procedure Date  . Appendectomy   . Ovarian cyst surgery     left  . Tonsillectomy   . Back surgery   . Tubal ligation   . Dental extraction     L maxillary molar  . Kyphosis surgery 08/2007  . Total knee arthroplasty 06/2010    left     Review of Systems Since the  ER visit, only one time she felt "gas" in the upper abdomen, did not have any fever or chills, no nausea or vomiting. Other than above  she is pretty much asymptomatic.     Objective:   Physical Exam  Constitutional: She is  oriented to person, place, and time. She appears well-developed and well-nourished.  HENT:  Head: Normocephalic and atraumatic.  Abdominal: Soft. Bowel sounds are normal. She exhibits no distension. There is no tenderness. There is no rebound and no guarding.  Musculoskeletal: She exhibits no edema.  Neurological: She is alert and oriented to person, place, and time.       Assessment & Plan:

## 2011-03-22 ENCOUNTER — Other Ambulatory Visit (INDEPENDENT_AMBULATORY_CARE_PROVIDER_SITE_OTHER): Payer: Self-pay | Admitting: General Surgery

## 2011-03-23 ENCOUNTER — Other Ambulatory Visit (INDEPENDENT_AMBULATORY_CARE_PROVIDER_SITE_OTHER): Payer: Self-pay | Admitting: General Surgery

## 2011-03-26 ENCOUNTER — Other Ambulatory Visit: Payer: Medicare Other

## 2011-03-26 ENCOUNTER — Other Ambulatory Visit: Payer: Medicare Other | Admitting: *Deleted

## 2011-03-26 ENCOUNTER — Encounter (INDEPENDENT_AMBULATORY_CARE_PROVIDER_SITE_OTHER): Payer: Self-pay | Admitting: General Surgery

## 2011-03-26 ENCOUNTER — Other Ambulatory Visit (INDEPENDENT_AMBULATORY_CARE_PROVIDER_SITE_OTHER): Payer: Self-pay | Admitting: General Surgery

## 2011-03-26 NOTE — Progress Notes (Unsigned)
[name] was scheduled for laparoscopic cholecystectomy but she decide to cancel it.  Cardiac clearance had been received for this. She saw Dr. Drue Novel nd has decided she wants to have operation out.I spoke with Dr. Drue Novel about this. We will work on rescheduling the operation.

## 2011-03-27 ENCOUNTER — Telehealth (INDEPENDENT_AMBULATORY_CARE_PROVIDER_SITE_OTHER): Payer: Self-pay

## 2011-03-27 NOTE — Telephone Encounter (Signed)
Pt called today to confirm that she would receive antibiotics before her surgery due to recent knee surgery.  I assured her that she would be receiving IV antibiotics prior to her surgery.

## 2011-03-29 ENCOUNTER — Encounter (HOSPITAL_COMMUNITY): Payer: Self-pay

## 2011-03-29 ENCOUNTER — Encounter (HOSPITAL_COMMUNITY)
Admission: RE | Admit: 2011-03-29 | Discharge: 2011-03-29 | Payer: Medicare Other | Source: Ambulatory Visit | Attending: General Surgery | Admitting: General Surgery

## 2011-03-29 DIAGNOSIS — M1710 Unilateral primary osteoarthritis, unspecified knee: Secondary | ICD-10-CM

## 2011-03-29 DIAGNOSIS — K449 Diaphragmatic hernia without obstruction or gangrene: Secondary | ICD-10-CM | POA: Insufficient documentation

## 2011-03-29 DIAGNOSIS — A044 Other intestinal Escherichia coli infections: Secondary | ICD-10-CM | POA: Insufficient documentation

## 2011-03-29 DIAGNOSIS — G4733 Obstructive sleep apnea (adult) (pediatric): Secondary | ICD-10-CM | POA: Insufficient documentation

## 2011-03-29 DIAGNOSIS — I639 Cerebral infarction, unspecified: Secondary | ICD-10-CM | POA: Insufficient documentation

## 2011-03-29 DIAGNOSIS — F418 Other specified anxiety disorders: Secondary | ICD-10-CM | POA: Insufficient documentation

## 2011-03-29 DIAGNOSIS — M179 Osteoarthritis of knee, unspecified: Secondary | ICD-10-CM

## 2011-03-29 HISTORY — DX: Other specified anxiety disorders: F41.8

## 2011-03-29 HISTORY — DX: Diaphragmatic hernia without obstruction or gangrene: K44.9

## 2011-03-29 HISTORY — DX: Osteoarthritis of knee, unspecified: M17.9

## 2011-03-29 HISTORY — DX: Cerebral infarction, unspecified: I63.9

## 2011-03-29 HISTORY — DX: Unilateral primary osteoarthritis, unspecified knee: M17.10

## 2011-03-29 HISTORY — DX: Other intestinal Escherichia coli infections: A04.4

## 2011-03-29 HISTORY — DX: Obstructive sleep apnea (adult) (pediatric): G47.33

## 2011-03-29 LAB — CBC
HCT: 51.5 % — ABNORMAL HIGH (ref 36.0–46.0)
Hemoglobin: 15.9 g/dL — ABNORMAL HIGH (ref 12.0–15.0)
MCH: 24.4 pg — ABNORMAL LOW (ref 26.0–34.0)
MCHC: 30.9 g/dL (ref 30.0–36.0)
MCV: 79 fL (ref 78.0–100.0)
Platelets: 593 10*3/uL — ABNORMAL HIGH (ref 150–400)
RBC: 6.52 MIL/uL — ABNORMAL HIGH (ref 3.87–5.11)
RDW: 16 % — ABNORMAL HIGH (ref 11.5–15.5)
WBC: 8.4 10*3/uL (ref 4.0–10.5)

## 2011-03-29 LAB — COMPREHENSIVE METABOLIC PANEL
ALT: 10 U/L (ref 0–35)
AST: 19 U/L (ref 0–37)
Albumin: 3.6 g/dL (ref 3.5–5.2)
Alkaline Phosphatase: 99 U/L (ref 39–117)
BUN: 9 mg/dL (ref 6–23)
CO2: 31 mEq/L (ref 19–32)
Calcium: 10 mg/dL (ref 8.4–10.5)
Chloride: 101 mEq/L (ref 96–112)
Creatinine, Ser: 0.71 mg/dL (ref 0.50–1.10)
GFR calc Af Amer: >90 mL/min (ref >90)
GFR calc non Af Amer: 80 mL/min — ABNORMAL LOW (ref >90)
Glucose, Bld: 92 mg/dL (ref 70–99)
Potassium: 3.8 mEq/L (ref 3.5–5.1)
Sodium: 139 mEq/L (ref 135–145)
Total Bilirubin: 0.9 mg/dL (ref 0.3–1.2)
Total Protein: 6.8 g/dL (ref 6.0–8.3)

## 2011-03-29 LAB — SURGICAL PCR SCREEN
MRSA, PCR: NEGATIVE
Staphylococcus aureus: NEGATIVE

## 2011-03-29 LAB — DIFFERENTIAL
Basophils Absolute: 0.1 10*3/uL (ref 0.0–0.1)
Basophils Relative: 1 % (ref 0–1)
Eosinophils Absolute: 0.3 10*3/uL (ref 0.0–0.7)
Eosinophils Relative: 4 % (ref 0–5)
Lymphocytes Relative: 17 % (ref 12–46)
Lymphs Abs: 1.4 10*3/uL (ref 0.7–4.0)
Monocytes Absolute: 0.6 10*3/uL (ref 0.1–1.0)
Monocytes Relative: 7 % (ref 3–12)
Neutro Abs: 6 10*3/uL (ref 1.7–7.7)
Neutrophils Relative %: 72 % (ref 43–77)

## 2011-03-29 LAB — PROTIME-INR
INR: 0.98 (ref 0.00–1.49)
Prothrombin Time: 13.2 seconds (ref 11.6–15.2)

## 2011-03-29 NOTE — Pre-Procedure Instructions (Signed)
03-29-11 EKG(02-25-11, 9'12), CXR(02-25-11 with chart, U/s abdomen with chart(02-25-11).

## 2011-03-29 NOTE — Patient Instructions (Addendum)
20 IFE VITELLI  03/29/2011   Your procedure is scheduled on:  04-09-11  Report to [facility] at  0745 AM.  Call this number if you have problems the morning of surgery: [phone]   Remember:   Do not eat food:After Midnight.  Do not drink clear liquids: After Midnight.  Take these medicines the morning of surgery with A SIP OF WATER:Omeprazole, Metoprolol, Ativan, Tylenol #3   Do not wear jewelry, make-up or nail polish.  Do not wear lotions, powders, or perfumes. You may wear deodorant.  Do not shave 48 hours prior to surgery.  Do not bring valuables to the hospital.  Contacts, dentures or bridgework may not be worn into surgery.  Leave suitcase in the car. After surgery it may be brought to your room.  For patients admitted to the hospital, checkout time is 11:00 AM the day of discharge.   Patients discharged the day of surgery will not be allowed to drive home.  Name and phone number of your driver:   Special Instructions: CHG Shower Use Special Wash: 1/2 bottle night before surgery and 1/2 bottle morning of surgery.   Please read over the following fact sheets that you were given: MRSA Information

## 2011-04-06 ENCOUNTER — Encounter (HOSPITAL_COMMUNITY): Payer: Self-pay

## 2011-04-09 ENCOUNTER — Ambulatory Visit (HOSPITAL_COMMUNITY): Payer: Medicare Other | Admitting: Anesthesiology

## 2011-04-09 ENCOUNTER — Ambulatory Visit: Admit: 2011-04-09 | Payer: Self-pay | Admitting: General Surgery

## 2011-04-09 ENCOUNTER — Encounter (HOSPITAL_COMMUNITY): Payer: Self-pay | Admitting: Anesthesiology

## 2011-04-09 ENCOUNTER — Telehealth (INDEPENDENT_AMBULATORY_CARE_PROVIDER_SITE_OTHER): Payer: Self-pay | Admitting: General Surgery

## 2011-04-09 ENCOUNTER — Other Ambulatory Visit (INDEPENDENT_AMBULATORY_CARE_PROVIDER_SITE_OTHER): Payer: Self-pay | Admitting: General Surgery

## 2011-04-09 ENCOUNTER — Ambulatory Visit (HOSPITAL_COMMUNITY): Payer: Medicare Other

## 2011-04-09 ENCOUNTER — Encounter (HOSPITAL_COMMUNITY)
Admission: AD | Disposition: A | Discharge status: Home / Self Care | Payer: Self-pay | Source: Ambulatory Visit | Attending: General Surgery

## 2011-04-09 ENCOUNTER — Encounter (HOSPITAL_COMMUNITY): Payer: Self-pay | Admitting: *Deleted

## 2011-04-09 ENCOUNTER — Inpatient Hospital Stay (HOSPITAL_COMMUNITY)
Admission: AD | Admit: 2011-04-09 | Discharge: 2011-04-10 | DRG: 419 | Disposition: A | Discharge status: Home / Self Care | Payer: Medicare Other | Source: Ambulatory Visit | Attending: General Surgery | Admitting: General Surgery

## 2011-04-09 DIAGNOSIS — K851 Biliary acute pancreatitis without necrosis or infection: Secondary | ICD-10-CM | POA: Diagnosis present

## 2011-04-09 DIAGNOSIS — Z86718 Personal history of other venous thrombosis and embolism: Secondary | ICD-10-CM

## 2011-04-09 DIAGNOSIS — Z79899 Other long term (current) drug therapy: Secondary | ICD-10-CM

## 2011-04-09 DIAGNOSIS — K5730 Diverticulosis of large intestine without perforation or abscess without bleeding: Secondary | ICD-10-CM | POA: Diagnosis present

## 2011-04-09 DIAGNOSIS — K219 Gastro-esophageal reflux disease without esophagitis: Secondary | ICD-10-CM | POA: Diagnosis present

## 2011-04-09 DIAGNOSIS — Z96659 Presence of unspecified artificial knee joint: Secondary | ICD-10-CM

## 2011-04-09 DIAGNOSIS — K8050 Calculus of bile duct without cholangitis or cholecystitis without obstruction: Secondary | ICD-10-CM | POA: Diagnosis present

## 2011-04-09 DIAGNOSIS — K449 Diaphragmatic hernia without obstruction or gangrene: Secondary | ICD-10-CM | POA: Diagnosis present

## 2011-04-09 DIAGNOSIS — E785 Hyperlipidemia, unspecified: Secondary | ICD-10-CM | POA: Diagnosis present

## 2011-04-09 DIAGNOSIS — Z8673 Personal history of transient ischemic attack (TIA), and cerebral infarction without residual deficits: Secondary | ICD-10-CM

## 2011-04-09 DIAGNOSIS — I10 Essential (primary) hypertension: Secondary | ICD-10-CM | POA: Diagnosis present

## 2011-04-09 DIAGNOSIS — R079 Chest pain, unspecified: Secondary | ICD-10-CM

## 2011-04-09 DIAGNOSIS — K859 Acute pancreatitis without necrosis or infection, unspecified: Principal | ICD-10-CM | POA: Diagnosis present

## 2011-04-09 DIAGNOSIS — G4733 Obstructive sleep apnea (adult) (pediatric): Secondary | ICD-10-CM | POA: Diagnosis present

## 2011-04-09 DIAGNOSIS — M1710 Unilateral primary osteoarthritis, unspecified knee: Secondary | ICD-10-CM | POA: Diagnosis present

## 2011-04-09 DIAGNOSIS — F341 Dysthymic disorder: Secondary | ICD-10-CM | POA: Diagnosis present

## 2011-04-09 DIAGNOSIS — Z8379 Family history of other diseases of the digestive system: Secondary | ICD-10-CM

## 2011-04-09 DIAGNOSIS — M810 Age-related osteoporosis without current pathological fracture: Secondary | ICD-10-CM | POA: Diagnosis present

## 2011-04-09 DIAGNOSIS — I8390 Asymptomatic varicose veins of unspecified lower extremity: Secondary | ICD-10-CM | POA: Diagnosis present

## 2011-04-09 DIAGNOSIS — Z880 Allergy status to penicillin: Secondary | ICD-10-CM

## 2011-04-09 DIAGNOSIS — IMO0002 Reserved for concepts with insufficient information to code with codable children: Secondary | ICD-10-CM | POA: Diagnosis present

## 2011-04-09 DIAGNOSIS — K8010 Calculus of gallbladder with chronic cholecystitis without obstruction: Secondary | ICD-10-CM

## 2011-04-09 DIAGNOSIS — I2510 Atherosclerotic heart disease of native coronary artery without angina pectoris: Secondary | ICD-10-CM | POA: Diagnosis present

## 2011-04-09 HISTORY — PX: CHOLECYSTECTOMY: SHX55

## 2011-04-09 SURGERY — LAPAROSCOPIC CHOLECYSTECTOMY WITH INTRAOPERATIVE CHOLANGIOGRAM
Anesthesia: General | Site: Abdomen | Wound class: Clean Contaminated

## 2011-04-09 SURGERY — LAPAROSCOPIC CHOLECYSTECTOMY WITH INTRAOPERATIVE CHOLANGIOGRAM
Anesthesia: General

## 2011-04-09 MED ORDER — ONDANSETRON HCL 4 MG/2ML IJ SOLN
4.0000 mg | Freq: Four times a day (QID) | INTRAMUSCULAR | Status: DC | PRN
  Administered 2011-04-09 – 2011-04-10 (×2): 4 mg via INTRAVENOUS
  Filled 2011-04-09 (×2): qty 2

## 2011-04-09 MED ORDER — MORPHINE SULFATE 2 MG/ML IJ SOLN
2.0000 mg | INTRAMUSCULAR | Status: DC | PRN
  Administered 2011-04-09 (×2): 2 mg via INTRAVENOUS
  Administered 2011-04-09: 4 mg via INTRAVENOUS
  Administered 2011-04-09 – 2011-04-10 (×4): 2 mg via INTRAVENOUS
  Filled 2011-04-09: qty 1
  Filled 2011-04-09: qty 2
  Filled 2011-04-09 (×3): qty 1

## 2011-04-09 MED ORDER — LIDOCAINE HCL (CARDIAC) 20 MG/ML IV SOLN
INTRAVENOUS | Status: DC | PRN
  Administered 2011-04-09: 60 mg via INTRAVENOUS

## 2011-04-09 MED ORDER — GLYCOPYRROLATE 0.2 MG/ML IJ SOLN
INTRAMUSCULAR | Status: DC | PRN
  Administered 2011-04-09: .4 mg via INTRAVENOUS

## 2011-04-09 MED ORDER — PROMETHAZINE HCL 25 MG/ML IJ SOLN: 6.2500 mg | INTRAMUSCULAR | Status: DC | PRN

## 2011-04-09 MED ORDER — LORAZEPAM 0.5 MG PO TABS: 0.5000 mg | ORAL_TABLET | Freq: Four times a day (QID) | ORAL | Status: DC | PRN

## 2011-04-09 MED ORDER — OXYCODONE-ACETAMINOPHEN 5-325 MG PO TABS
1.0000 | ORAL_TABLET | ORAL | Status: DC | PRN
  Administered 2011-04-09 – 2011-04-10 (×2): 2 via ORAL
  Filled 2011-04-09 (×2): qty 2

## 2011-04-09 MED ORDER — LACTATED RINGERS IV SOLN: INTRAVENOUS | Status: DC

## 2011-04-09 MED ORDER — LACTATED RINGERS IV SOLN
INTRAVENOUS | Status: DC | PRN
  Administered 2011-04-09 (×2): via INTRAVENOUS

## 2011-04-09 MED ORDER — LACTATED RINGERS IR SOLN
Status: DC | PRN
  Administered 2011-04-09: 1000 mL

## 2011-04-09 MED ORDER — ONDANSETRON HCL 4 MG/2ML IJ SOLN
INTRAMUSCULAR | Status: DC | PRN
  Administered 2011-04-09: 4 mg via INTRAVENOUS

## 2011-04-09 MED ORDER — DIATRIZOATE MEGLUMINE 30 % UR SOLN
URETHRAL | Status: DC | PRN
  Administered 2011-04-09: 7 mL

## 2011-04-09 MED ORDER — FENTANYL CITRATE 0.05 MG/ML IJ SOLN
INTRAMUSCULAR | Status: DC | PRN
  Administered 2011-04-09: 50 ug via INTRAVENOUS
  Administered 2011-04-09: 100 ug via INTRAVENOUS
  Administered 2011-04-09: 50 ug via INTRAVENOUS

## 2011-04-09 MED ORDER — FENTANYL CITRATE 0.05 MG/ML IJ SOLN
25.0000 ug | INTRAMUSCULAR | Status: DC | PRN
  Administered 2011-04-09 (×3): 50 ug via INTRAVENOUS

## 2011-04-09 MED ORDER — PANTOPRAZOLE SODIUM 40 MG PO TBEC
40.0000 mg | DELAYED_RELEASE_TABLET | Freq: Every day | ORAL | Status: DC
  Administered 2011-04-10: 40 mg via ORAL
  Filled 2011-04-09: qty 1

## 2011-04-09 MED ORDER — BUPIVACAINE HCL 0.5 % IJ SOLN
INTRAMUSCULAR | Status: DC | PRN
  Administered 2011-04-09: 19 mL

## 2011-04-09 MED ORDER — ROCURONIUM BROMIDE 100 MG/10ML IV SOLN
INTRAVENOUS | Status: DC | PRN
  Administered 2011-04-09: 40 mg via INTRAVENOUS

## 2011-04-09 MED ORDER — CIPROFLOXACIN IN D5W 400 MG/200ML IV SOLN
400.0000 mg | INTRAVENOUS | Status: AC
  Administered 2011-04-09: 400 mg via INTRAVENOUS

## 2011-04-09 MED ORDER — METOPROLOL TARTRATE 25 MG PO TABS
25.0000 mg | ORAL_TABLET | Freq: Two times a day (BID) | ORAL | Status: DC
  Administered 2011-04-09 – 2011-04-10 (×2): 25 mg via ORAL
  Filled 2011-04-09 (×3): qty 1

## 2011-04-09 MED ORDER — NEOSTIGMINE METHYLSULFATE 1 MG/ML IJ SOLN
INTRAMUSCULAR | Status: DC | PRN
  Administered 2011-04-09: 4 mg via INTRAVENOUS

## 2011-04-09 MED ORDER — PROPOFOL 10 MG/ML IV EMUL
INTRAVENOUS | Status: DC | PRN
  Administered 2011-04-09: 120 mg via INTRAVENOUS

## 2011-04-09 MED ORDER — KCL IN DEXTROSE-NACL 20-5-0.9 MEQ/L-%-% IV SOLN
INTRAVENOUS | Status: DC
  Administered 2011-04-09 – 2011-04-10 (×2): via INTRAVENOUS
  Filled 2011-04-09 (×3): qty 1000

## 2011-04-09 MED ORDER — ONDANSETRON HCL 4 MG PO TABS: 4.0000 mg | ORAL_TABLET | Freq: Four times a day (QID) | ORAL | Status: DC | PRN

## 2011-04-09 MED ORDER — SODIUM CHLORIDE 0.9 % IR SOLN
Status: DC | PRN
  Administered 2011-04-09: 7 mL

## 2011-04-09 MED ORDER — MEPERIDINE HCL 50 MG/ML IJ SOLN: 6.2500 mg | INTRAMUSCULAR | Status: DC | PRN

## 2011-04-09 SURGICAL SUPPLY — 48 items
APL SKNCLS STERI-STRIP NONHPOA (GAUZE/BANDAGES/DRESSINGS) ×1
APPLIER CLIP 5 13 M/L LIGAMAX5 (MISCELLANEOUS) ×2
APPLIER CLIP ROT 10 11.4 M/L (STAPLE)
APR CLP MED LRG 11.4X10 (STAPLE)
APR CLP MED LRG 5 ANG JAW (MISCELLANEOUS) ×1
BAG SPEC RTRVL LRG 6X4 10 (ENDOMECHANICALS)
BENZOIN TINCTURE PRP APPL 2/3 (GAUZE/BANDAGES/DRESSINGS) ×2 IMPLANT
CANISTER SUCTION 2500CC (MISCELLANEOUS) ×2 IMPLANT
CHLORAPREP W/TINT 26ML (MISCELLANEOUS) ×2 IMPLANT
CLIP APPLIE 5 13 M/L LIGAMAX5 (MISCELLANEOUS) ×1 IMPLANT
CLIP APPLIE ROT 10 11.4 M/L (STAPLE) IMPLANT
CLOTH BEACON ORANGE TIMEOUT ST (SAFETY) ×2 IMPLANT
COVER MAYO STAND STRL (DRAPES) IMPLANT
COVER SURGICAL LIGHT HANDLE (MISCELLANEOUS) IMPLANT
DECANTER SPIKE VIAL GLASS SM (MISCELLANEOUS) ×2 IMPLANT
DRAPE C-ARM 42X72 X-RAY (DRAPES) IMPLANT
DRAPE LAPAROSCOPIC ABDOMINAL (DRAPES) ×2 IMPLANT
DRAPE UTILITY XL STRL (DRAPES) ×2 IMPLANT
DRSG TEGADERM 2-3/8X2-3/4 SM (GAUZE/BANDAGES/DRESSINGS) ×8 IMPLANT
DRSG TEGADERM 2.38X2.75 (GAUZE/BANDAGES/DRESSINGS) ×1 IMPLANT
DRSG TEGADERM 4X4.75 (GAUZE/BANDAGES/DRESSINGS) ×1 IMPLANT
ELECT REM PT RETURN 9FT ADLT (ELECTROSURGICAL) ×2
ELECTRODE REM PT RTRN 9FT ADLT (ELECTROSURGICAL) ×1 IMPLANT
ENDOLOOP SUT PDS II  0 18 (SUTURE)
ENDOLOOP SUT PDS II 0 18 (SUTURE) IMPLANT
GAUZE SPONGE 4X4 12PLY STRL LF (GAUZE/BANDAGES/DRESSINGS) ×1 IMPLANT
GLOVE BIOGEL PI IND STRL 7.0 (GLOVE) ×1 IMPLANT
GLOVE BIOGEL PI INDICATOR 7.0 (GLOVE) ×1
GLOVE ECLIPSE 8.0 STRL XLNG CF (GLOVE) ×2 IMPLANT
GLOVE INDICATOR 8.0 STRL GRN (GLOVE) ×4 IMPLANT
GOWN STRL NON-REIN LRG LVL3 (GOWN DISPOSABLE) ×2 IMPLANT
GOWN STRL REIN XL XLG (GOWN DISPOSABLE) ×4 IMPLANT
HEMOSTAT SURGICEL 4X8 (HEMOSTASIS) IMPLANT
IV CATH 14GX2 1/4 (CATHETERS) IMPLANT
KIT BASIN OR (CUSTOM PROCEDURE TRAY) ×2 IMPLANT
NS IRRIG 1000ML POUR BTL (IV SOLUTION) IMPLANT
POUCH SPECIMEN RETRIEVAL 10MM (ENDOMECHANICALS) IMPLANT
SET CHOLANGIOGRAPH MIX (MISCELLANEOUS) ×2 IMPLANT
SET IRRIG TUBING LAPAROSCOPIC (IRRIGATION / IRRIGATOR) ×2 IMPLANT
SOLUTION ANTI FOG 6CC (MISCELLANEOUS) ×2 IMPLANT
STRIP CLOSURE SKIN 1/2X4 (GAUZE/BANDAGES/DRESSINGS) ×2 IMPLANT
SUT MNCRL AB 4-0 PS2 18 (SUTURE) ×2 IMPLANT
TOWEL OR 17X26 10 PK STRL BLUE (TOWEL DISPOSABLE) ×2 IMPLANT
TRAY LAP CHOLE (CUSTOM PROCEDURE TRAY) ×2 IMPLANT
TROCAR BLADELESS OPT 5 75 (ENDOMECHANICALS) ×6 IMPLANT
TROCAR XCEL BLUNT TIP 100MML (ENDOMECHANICALS) ×2 IMPLANT
TROCAR XCEL NON-BLD 11X100MML (ENDOMECHANICALS) IMPLANT
TUBING INSUFFLATION 10FT LAP (TUBING) ×2 IMPLANT

## 2011-04-09 NOTE — Interval H&P Note (Signed)
History and Physical Interval Note:   04/09/2011   10:07 AM   [name]  has presented today for surgery, with the diagnosis of Gallstone pancreatitis  The various methods of treatment have been discussed with the patient and family. After consideration of risks, benefits and other options for treatment, the patient has consented to  Procedure(s): LAPAROSCOPIC CHOLECYSTECTOMY WITH INTRAOPERATIVE CHOLANGIOGRAM as a surgical intervention .  The patients' history has been reviewed, patient examined, no change in status, stable for surgery.  I have reviewed the patients' chart and labs.  Questions were answered to the patient's satisfaction.     Adolph Pollack  MD

## 2011-04-09 NOTE — H&P (Signed)
HPI [name] is a 77 y.o. female.   HPIShe is referred by Dr. Norlene Campbell from the emergency department because of abdominal pain and gallstones. On February 25, 2011 she had the onset of severe lower chest and upper abdominal pain radiating around the back associated with nausea. She had 2 previous episodes like this but not quite as bad. She has known heart disease but has had heart etiologies rule out during all of these episodes. An abdominal ultrasound was performed on October 14 which demonstrated multiple small gallstones and mild gallbladder wall thickening. Her lipase was significantly elevated her liver function tests were not significantly elevated. Her symptoms improve in the emergency department. She was discharged and sent over here for further evaluation and treatment. There is a family history of gallbladder disease. She had no fever or chills.    Past Medical History   Diagnosis  Date   .  Headache         migraines   .  Hypertension     .  Hyperlipidemia     .  Osteoporosis     .  CAD (coronary artery disease)         mild nonobstructive disease on cath in 2003   .  CVA (cerebral infarction)     .  GERD (gastroesophageal reflux disease)     .  Hiatal hernia     .  Hemorrhoids     .  Hyperplastic colon polyp  06/2001   .  Diverticulitis     .  OSA (obstructive sleep apnea)     .  Varicose vein     .  DVT (deep venous thrombosis)         after venous ablation, R leg   .  OA (osteoarthritis) of knee         w/ bilateral knee pain   .  Fracture  09/10/07       L2, status post vertebroplasty of L2 performed by IR   .  Depression with anxiety         lost husband   .  Cardiomyopathy         Probable Takotsubo, severe CP w/ normal cath in 1994. Severe CP in 2003 w/ widespread T wave inversions on ECG. Cath w/ minimal coronary disease but LV-gram showed periapical severe hypokinesis and basilar hyperkinesia (EF 40%). Last echo in 4/09 confirmed full LV functional  recovery with EF 60%, no regional wall motion abnormalities, mild to moderate LVH.   .  Gallstones         Past Surgical History   Procedure  Date   .  Appendectomy     .  Ovarian cyst surgery         left   .  Tonsillectomy     .  Back surgery     .  Tubal ligation     .  Dental extraction         L maxillary molar   .  Kyphosis surgery  08/2007   .  Total knee arthroplasty  06/2010       left       Family History   Problem  Relation  Age of Onset   .  Colon polyps           cousin   .  Colon cancer           cousin   .  Breast cancer  Mother     .  Breast cancer  Sister     .  Heart disease  Father         MIs   .  Leukemia  Brother         GM      Social History History   Substance Use Topics   .  Smoking status:  Former Smoker       Quit date:  05/14/1988   .  Smokeless tobacco:  Not on file   .  Alcohol Use:  Yes       Allergies   Allergen  Reactions   .  Atorvastatin         REACTION: diarrhea   .  Ezetimibe         REACTION: nausea   .  Fluvastatin Sodium     .  Magnesium Hydroxide         REACTION: triggers HAs   .  Penicillins     .  Quinapril Hcl     .  Rosuvastatin         REACTION: jaw pain   .  Simvastatin         REACTION: diarrhea   .  Valsartan         REACTION: toes itch       Medications reviewed.              Review of Systems Review of Systems  Constitutional: Negative for fever and chills.  HENT: Negative.   Eyes: Positive for visual disturbance.  Respiratory: Positive for shortness of breath.   Cardiovascular: Positive for chest pain.  Gastrointestinal:        Per hpi.  Genitourinary: Negative.   Musculoskeletal: Positive for arthralgias.  Neurological: Negative.   Hematological: Negative.   Psychiatric/Behavioral: Negative.      Physical Exam  Constitutional: No distress.       Overweight.  HENT:   Head: Normocephalic and atraumatic.  Eyes: Conjunctivae and EOM are normal.  Neck: Neck supple.    Cardiovascular: Normal rate and regular rhythm.    No murmur heard. Pulmonary/Chest: Effort normal and breath sounds normal. She has no wheezes.  Abdominal: Soft. She exhibits no distension and no mass. There is no tenderness.       Lower midline scar.  Musculoskeletal: She exhibits no edema.       Left knee scar.  Lymphadenopathy:    She has no cervical adenopathy.  Neurological: She is alert.  Skin: Skin is warm and dry. No rash noted.  Psychiatric: She has a normal mood and affect. Her behavior is normal.    Data Reviewed ED note with labs, [place].  Lipase was in the 600 range.   Assessment Gallstone pancreatitis.  Currently asx.  Has some significant comorbidities that seem to be well-controlled.   Plan Laparoscopic Cholecystectomy   I have explained the procedure, risks, and aftercare of cholecystectomy.  Risks include but are not limited to bleeding, infection, wound problems, anesthesia, diarrhea, bile leak, injury to common bile duct/liver/intestine.  She seems to understand.  All questions were answered. She has obtained cardiac clearance from Dr. Shirlee Latch.     Triva [employer]

## 2011-04-09 NOTE — Transfer of Care (Signed)
Immediate Anesthesia Transfer of Care Note  Patient: [name]  Procedure(s) Performed:  LAPAROSCOPIC CHOLECYSTECTOMY WITH INTRAOPERATIVE CHOLANGIOGRAM  Patient Location: PACU  Anesthesia Type: General  Level of Consciousness: awake and sedated  Airway & Oxygen Therapy: Patient Spontanous Breathing and Patient connected to face mask oxygen  Post-op Assessment: Report given to PACU RN and Post -op Vital signs reviewed and stable  Post vital signs: Reviewed and stable  Complications: No apparent anesthesia complications

## 2011-04-09 NOTE — Op Note (Signed)
Preoperative diagnosis:  Gallstone pancreatitis  Postoperative diagnosis:  Same  Procedure: Laparoscopic cholecystectomy with cholangiogram.  Surgeon: Avel Peace, M.D.  Asst.:  Gaynelle Adu, M.D.  Anesthesia: Gen.  Indication:   [name] is a 77 year old female with an episode of gallstone pancreatitis that resolved spontaneously without requiring hospitalization. She now presents for the above procedure. The procedure, risks, and after care have been discussed with her preoperatively.  Technique: The patient was brought to the operating room, placed supine on the operating table, and a general anesthetic was administered. The hair on the abdominal wall was clipped as was necessary. The abdominal wall was then sterilely prepped and draped. Local anesthetic (Marcaine) was infiltrated in the supraumbilical region. A small supraumbilical incision was made through the skin, subcutaneous tissue, fascia, and peritoneum entering the peritoneal cavity under direct vision. A pursestring suture of 0 Vicryl was placed around the edges of the fascia. A Hassan trocar was introduced into the peritoneal cavity and a pneumoperitoneum was created by insufflation of carbon dioxide gas. The laparoscope was introduced into the trocar and no underlying bleeding or organ injury was noted.  Adhesions were present between the lower abdominal wall and the omentum. There were also adhesions between the omentum and the right upper quadrant abdominal wall which were divided sharply.   The patient was then placed in the reverse Trendelenburg position with the right side tilted slightly up.  Three more trochars were then placed into the abdominal cavity under laparoscopic vision. One in the epigastric area, and 2 in the right upper quadrant area. The gallbladder was visualized and the fundus was grasped and retracted toward the right shoulder.  No acute inflammatory changes were noted.  The infundibulum was mobilized with  dissection close to the gallbladder and retracted laterally. The cystic duct was identified and a window was created around it. The cystic artery was also identified and a window was created around it. The critical view was achieved. A clip was placed at the neck of the gallbladder. A small incision was made in the cystic duct.  A small amount of sludge was milked out of the cystic duct. A cholangiocatheter was introduced through the anterior abdominal wall and placed in the cystic duct. A intraoperative cholangiogram was then performed.  Under real-time fluoroscopy, dilute contrast was injected into the cystic duct.  The common hepatic duct, the right and left hepatic ducts, and the common duct were all visualized. Contrast drained into the duodenum without obvious evidence of any obstructing ductal lesion. The final report is pending the Radiologist's interpretation.  The cholangiocatheter was removed, the cystic duct was clipped 3 times on the biliary side, and then the cystic duct was divided sharply. No bile leak was noted from the cystic duct stump.  The cystic artery was then clipped and divided. Following this the gallbladder was dissected free from the liver using electrocautery.  The gallbladder was punctured in 2 places and some bile spilled out but no stones spilled out. The gallbladder was then placed in a retrieval bag and removed from the abdominal cavity through the supraumbilical incision.  The gallbladder fossa was inspected, irrigated, and bleeding was controlled with electrocautery. Inspection showed that hemostasis was adequate and there was no evidence of bile leak.  The irrigation fluid was evacuated as much as possible.  The supraumbilical trocar was removed and the fascial defect was closed by tightening and tying down the pursestring suture under laparoscopic vision.  The remaining trochars were removed and the  pneumoperitoneum was released. The skin incisions were closed with  4-0 Monocryl subcuticular stitches. Steri-Strips and sterile dressings were applied.  The procedure was well-tolerated without any apparent complications. The patient was taken to the recovery room in satisfactory condition.

## 2011-04-09 NOTE — Anesthesia Postprocedure Evaluation (Signed)
  Anesthesia Post-op Note  Patient: [name]  Procedure(s) Performed:  LAPAROSCOPIC CHOLECYSTECTOMY WITH INTRAOPERATIVE CHOLANGIOGRAM  Patient Location: PACU  Anesthesia Type: General  Level of Consciousness: awake and alert   Airway and Oxygen Therapy: Patient Spontanous Breathing  Post-op Pain: mild  Post-op Assessment: Post-op Vital signs reviewed, Patient's Cardiovascular Status Stable, Respiratory Function Stable, Patent Airway and No signs of Nausea or vomiting  Post-op Vital Signs: stable  Complications: No apparent anesthesia complications

## 2011-04-09 NOTE — Anesthesia Preprocedure Evaluation (Addendum)
Anesthesia Evaluation  Patient identified by MRN, date of birth, ID band Patient awake    Reviewed: Allergy & Precautions, H&P , NPO status , Patient's Chart, lab work & pertinent test results  Airway Mallampati: II TM Distance: >3 FB Neck ROM: Full    Dental No notable dental hx.    Pulmonary neg pulmonary ROS, sleep apnea ,  clear to auscultation  Pulmonary exam normal       Cardiovascular hypertension, Pt. on medications + CAD and neg cardio ROS Regular Normal    Neuro/Psych  Headaches, TIACVA, No Residual Symptoms Negative Neurological ROS  Negative Psych ROS   GI/Hepatic negative GI ROS, Neg liver ROS, hiatal hernia, GERD-  Medicated and Controlled,  Endo/Other  Negative Endocrine ROS  Renal/GU negative Renal ROS  Genitourinary negative   Musculoskeletal negative musculoskeletal ROS (+)   Abdominal   Peds negative pediatric ROS (+)  Hematology negative hematology ROS (+)   Anesthesia Other Findings   Reproductive/Obstetrics negative OB ROS                          Anesthesia Physical Anesthesia Plan  ASA: III  Anesthesia Plan: General   Post-op Pain Management:    Induction: Intravenous  Airway Management Planned: Oral ETT  Additional Equipment:   Intra-op Plan:   Post-operative Plan: Extubation in OR  Informed Consent: I have reviewed the patients History and Physical, chart, labs and discussed the procedure including the risks, benefits and alternatives for the proposed anesthesia with the patient or authorized representative who has indicated his/her understanding and acceptance.   Dental advisory given  Plan Discussed with: CRNA  Anesthesia Plan Comments:         Anesthesia Quick Evaluation

## 2011-04-09 NOTE — Preoperative (Signed)
Beta Blockers   Reason not to administer Beta Blockers:Not Applicable,  

## 2011-04-10 MED ORDER — OXYCODONE-ACETAMINOPHEN 5-325 MG PO TABS: 1.0000 | ORAL_TABLET | ORAL | Status: AC | PRN

## 2011-04-10 NOTE — Progress Notes (Signed)
1 Day Post-Op  Subjective: Sore.  Tolerating liquids.  Objective: Vital signs in last 24 hours: Temp:  [97.1 F (36.2 C)-98.8 F (37.1 C)] 98.3 F (36.8 C) (11/27 0544) Pulse Rate:  [47-85] 76  (11/27 0544) Resp:  [9-18] 16  (11/27 0544) BP: (107-160)/(51-81) 107/60 mmHg (11/27 0544) SpO2:  [93 %-100 %] 97 % (11/27 0544) Weight:  [170 lb (77.111 kg)] 170 lb (77.111 kg) (11/26 1544) Last BM Date: 04/09/11  Intake/Output from previous day: 11/26 0701 - 11/27 0700 In: 2977.5 [I.V.:2977.5] Out: 1250 [Urine:1200; Blood:50] Intake/Output this shift:    PE: Abd- soft, dressings dry  Lab Results:  No results found for this basename: WBC:2,HGB:2,HCT:2,PLT:2 in the last 72 hours BMET No results found for this basename: NA:2,K:2,CL:2,CO2:2,GLUCOSE:2,BUN:2,CREATININE:2,CALCIUM:2 in the last 72 hours PT/INR No results found for this basename: LABPROT:2,INR:2 in the last 72 hours Comprehensive Metabolic Panel:    Component Value Date/Time   NA 139 03/29/2011 1430   K 3.8 03/29/2011 1430   CL 101 03/29/2011 1430   CO2 31 03/29/2011 1430   BUN 9 03/29/2011 1430   CREATININE 0.71 03/29/2011 1430   GLUCOSE 92 03/29/2011 1430   CALCIUM 10.0 03/29/2011 1430   AST 19 03/29/2011 1430   ALT 10 03/29/2011 1430   ALKPHOS 99 03/29/2011 1430   BILITOT 0.9 03/29/2011 1430   PROT 6.8 03/29/2011 1430   ALBUMIN 3.6 03/29/2011 1430     Studies/Results: Dg Cholangiogram Operative  04/09/2011  *RADIOLOGY REPORT*  Clinical Data:   Cholelithiasis.  INTRAOPERATIVE CHOLANGIOGRAM  Technique:  Cholangiographic images from the C-arm fluoroscopic device were submitted for interpretation post-operatively.  Please see the procedural report for the amount of contrast and the fluoroscopy time utilized.  Comparison:  Abdominal ultrasound 02/25/2011.  Findings:  Cine fluoroscopic spot images from an intraoperative cholangiogram demonstrate cannulation of the cystic duct.  The common bile duct is normal in  caliber.  No definite filling defects are seen.  There is normal drainage of contrast into the duodenum. The visualized intrahepatic ducts appear normal.  No contrast extravasation is seen.  IMPRESSION: Normal intraoperative cholangiogram.  Original Report Authenticated By: P. Loralie Champagne, M.D.    Anti-infectives: Anti-infectives     Start     Dose/Rate Route Frequency Ordered Stop   04/09/11 0830   ciprofloxacin (CIPRO) IVPB 400 mg        400 mg 200 mL/hr over 60 Minutes Intravenous 120 min pre-op 04/09/11 1610 04/09/11 1015          Assessment Active Problems:  Gallstone pancreatitis, s/p lap. chole. 04/09/11-stable overnight    LOS: 1 day   Plan: Mobilize and hopefully discharge later today.  Instructions given.   Lexander Tremblay J 04/10/2011

## 2011-04-10 NOTE — Plan of Care (Signed)
Problem: Phase II Progression Outcomes Goal: Pain controlled Outcome: Completed/Met Date Met:  04/10/11 With prescribed pain medication Goal: Return of bowel function (flatus, BM) IF ABDOMINAL SURGERY:  Outcome: Completed/Met Date Met:  04/10/11 Pt belching.

## 2011-04-11 ENCOUNTER — Encounter (HOSPITAL_COMMUNITY): Payer: Self-pay | Admitting: General Surgery

## 2011-04-17 NOTE — Discharge Summary (Signed)
Physician Discharge Summary  Patient ID: [name] MRN: [id number] DOB/AGE: 06-01-1929 77 y.o.  Admit date: 04/09/2011 Discharge date: 04/17/2011  Admission Diagnoses:  Discharge Diagnoses:  Active Problems:  Gallstone pancreatitis, s/p lap. chole. 04/09/11   Discharged Condition: good  Hospital Course:   She underwent the above procedure and tolerated this well.  She was able to be discharged in satisfactory condition on POD#1  Consults: none  Significant Diagnostic Studies: none  Treatments: surgery  Discharge Exam: Blood pressure 123/53, pulse 80, temperature 98.3 F (36.8 C), temperature source Oral, resp. rate 16, height 5\' 8"  (1.727 m), weight 170 lb (77.111 kg), SpO2 97.00%.   Disposition: Home or Self Care  Discharge Orders    Future Appointments: Provider: Department: Dept Phone: Center:   04/25/2011 2:00 PM Marca Ancona, MD [facility] [phone] [facility]   05/02/2011 9:50 AM Adolph Pollack, MD [facility] [phone] None   05/11/2011 10:00 AM Wanda Plump, MD [facility] [phone] [facility]     Future Orders Please Complete By Expires   Diet general      Comments:   Lowfat.   Increase activity slowly      Discharge instructions      Comments:   [facility] LAPAROSCOPIC SURGERY: POST OP INSTRUCTIONS Always review your discharge instruction sheet given to you by the facility where your surgery was performed. IF YOU HAVE DISABILITY OR FAMILY LEAVE FORMS, YOU MUST BRING THEM TO THE OFFICE FOR PROCESSING.   DO NOT GIVE THEM TO YOUR DOCTOR.  A prescription for pain medication may be given to you upon discharge.  Take your pain medication as prescribed, if needed.  If narcotic pain medicine is not needed, then you may take acetaminophen (Tylenol) or ibuprofen (Advil) as needed. Take your usually prescribed medications unless otherwise directed. If you need a refill on your pain medication, please  contact your pharmacy.  They will contact our office to request authorization. Prescriptions will not be filled after 5pm or on week-ends. You should follow a light diet the first few days after arrival home, such as soup and crackers, etc.  Be sure to include lots of fluids daily. Most patients will experience some swelling and bruising in the area of the incisions.  Ice packs will help.  Swelling and bruising can take several days to resolve.  It is common to experience some constipation if taking pain medication after surgery.  Increasing fluid intake and taking a stool softener (such as Colace) will usually help or prevent this problem from occurring.  A mild laxative (Milk of Magnesia or Miralax) should be taken according to package instructions if there are no bowel movements after 48 hours. Unless discharge instructions indicate otherwise, you may remove your bandages 24-48 hours after surgery, and you may shower at that time.  You may have steri-strips (small skin tapes) in place directly over the incision.  These strips should be left on the skin for 7-10 days.  If your surgeon used skin glue on the incision, you may shower in 24 hours.  The glue will flake off over the next 2-3 weeks.  Any sutures or staples will be removed at the office during your follow-up visit. ACTIVITIES:  You may resume regular (light) daily activities beginning the next day-such as daily self-care, walking, climbing stairs-gradually increasing activities as tolerated.  You may have sexual intercourse when it is comfortable.  Refrain from any heavy lifting or straining until approved by your doctor. You  may drive when you are no longer taking prescription pain medication, you can comfortably wear a seatbelt, and you can safely maneuver your car and apply brakes. RETURN TO WORK:  ______N/A____________________________________________________ Bonita Quin should see your doctor in the office for a follow-up appointment approximately 2-3  weeks after your surgery.  Make sure that you call for this appointment within a day or two after you arrive home to insure a convenient appointment time. OTHER INSTRUCTIONS: __________________________________________________________________________________________________________________________ __________________________________________________________________________________________________________________________ WHEN TO CALL YOUR DOCTOR: Fever over 101.0 Inability to urinate Continued bleeding from incision. Increased pain, redness, or drainage from the incision. Increasing abdominal pain  The clinic staff is available to answer your questions during regular business hours.  Please don't hesitate to call and ask to speak to one of the nurses for clinical concerns.  If you have a medical emergency, go to the nearest emergency room or call 911.  A surgeon from [facility] is always on call at the hospital. [address], [place], [place]  [address] ? [address] [address], [place], [place]   [address] [phone] ? [phone] ? FAX [phone] Web site: [email or web address]   Remove dressing in 48 hours        Discharge Medication List as of 04/10/2011 11:57 AM    START taking these medications   Details  oxyCODONE-acetaminophen (PERCOCET) 5-325 MG per tablet Take 1-2 tablets by mouth every 4 (four) hours as needed., Starting 04/10/2011, Until Fri 04/20/11, Print      CONTINUE these medications which have NOT CHANGED   Details  aspirin 81 MG tablet Take 81 mg by mouth daily.  , Until Discontinued, Historical Med    furosemide (LASIX) 20 MG tablet Take 20 mg by mouth daily as needed. For fluid retention, Until Discontinued, Historical Med    acetaminophen-codeine (TYLENOL #3) 300-30 MG per tablet TAKE 1 TABLET BY MOUTH EVERY 8 HOURS AS NEEDED FOR MIGRAINE, Print    Calcium Carbonate Antacid (TUMS ULTRA 1000 PO) Take 2 tablets by mouth daily. , Until  Discontinued, Historical Med    cholecalciferol (VITAMIN D) 1000 UNITS tablet Take 1,000 Units by mouth daily.  , Until Discontinued, Historical Med    docusate sodium (COLACE) 100 MG capsule Take 100 mg by mouth daily as needed. For constipation, Until Discontinued, Historical Med    LORazepam (ATIVAN) 0.5 MG tablet  , Starting 01/31/2011, Until Discontinued, Historical Med    LUTEIN PO Take 1 tablet by mouth daily.  , Until Discontinued, Historical Med    metoprolol tartrate (LOPRESSOR) 25 MG tablet Take 1 tablet (25 mg total) by mouth 2 (two) times daily., Starting 01/16/2011, Until Wed 01/16/12, Normal    niacin (NIASPAN) 500 MG CR tablet Take 1 tablet (500 mg total) by mouth at bedtime., Starting 02/07/2011, Until Thu 02/07/12, Normal    omeprazole (PRILOSEC) 40 MG capsule Take 40 mg by mouth daily.  , Until Discontinued, Historical Med    vitamin E 400 UNIT capsule Take 400 Units by mouth daily. , Until Discontinued, Historical Med         Signed: Tere Mcconaughey J 04/17/2011, 2:19 PM

## 2011-04-21 ENCOUNTER — Other Ambulatory Visit: Payer: Self-pay | Admitting: Internal Medicine

## 2011-04-23 NOTE — Telephone Encounter (Signed)
Last Ov 03-21-11, last filled 06-19-10 #[phone] 3  

## 2011-04-24 ENCOUNTER — Ambulatory Visit: Payer: Medicare Other | Admitting: Cardiology

## 2011-04-25 ENCOUNTER — Ambulatory Visit: Payer: Medicare Other | Admitting: Cardiology

## 2011-04-27 NOTE — Telephone Encounter (Signed)
Pt called and said she is now completely out of meds.  Please advise.

## 2011-04-27 NOTE — Telephone Encounter (Deleted)
Last Ov 03-21-11, last filled 06-19-10 #90 3

## 2011-04-30 NOTE — Telephone Encounter (Signed)
Per Dr Drue Novel ok #[phone] , Rx sent to pharmacy

## 2011-04-30 NOTE — Telephone Encounter (Signed)
Patients daughter called stating patient  Still doesn't have med she wants to know what the hold up is

## 2011-05-01 ENCOUNTER — Encounter (INDEPENDENT_AMBULATORY_CARE_PROVIDER_SITE_OTHER): Payer: Medicare Other | Admitting: General Surgery

## 2011-05-02 ENCOUNTER — Ambulatory Visit (INDEPENDENT_AMBULATORY_CARE_PROVIDER_SITE_OTHER): Payer: Medicare Other | Admitting: General Surgery

## 2011-05-02 ENCOUNTER — Encounter (INDEPENDENT_AMBULATORY_CARE_PROVIDER_SITE_OTHER): Payer: Self-pay | Admitting: General Surgery

## 2011-05-02 VITALS — BP 122/76 | HR 68 | Temp 97.0°F | Resp 18 | Ht 67.0 in | Wt 168.2 lb

## 2011-05-02 DIAGNOSIS — Z9889 Other specified postprocedural states: Secondary | ICD-10-CM

## 2011-05-02 NOTE — Patient Instructions (Signed)
Activities as tolerated.  Lowfat diet. 

## 2011-05-02 NOTE — Progress Notes (Signed)
Operation:  Laparoscopic cholecystectomy with cholangiogram  Date:  April 09, 2011  Pathology: Chronic cholecystitis and cholelithiasis  HPI:  She is here for her first postoperative visit. She has no pain. She is tolerating her diet. She has no diarrhea.   Physical Exam:  Abdomen-soft, incisions clean dry and intact   Assessment:  Doing well postoperatively.  Plan:  Activities as tolerated. Lifelong low fat diet. Return p.r.n.

## 2011-05-09 ENCOUNTER — Other Ambulatory Visit: Payer: Self-pay | Admitting: Internal Medicine

## 2011-05-10 NOTE — Telephone Encounter (Signed)
Close encounter 

## 2011-05-11 ENCOUNTER — Encounter: Payer: Self-pay | Admitting: Internal Medicine

## 2011-05-11 ENCOUNTER — Ambulatory Visit (INDEPENDENT_AMBULATORY_CARE_PROVIDER_SITE_OTHER): Payer: Medicare Other | Admitting: Internal Medicine

## 2011-05-11 VITALS — BP 130/70 | HR 71 | Temp 97.8°F | Wt 169.8 lb

## 2011-05-11 DIAGNOSIS — I2510 Atherosclerotic heart disease of native coronary artery without angina pectoris: Secondary | ICD-10-CM

## 2011-05-11 DIAGNOSIS — E785 Hyperlipidemia, unspecified: Secondary | ICD-10-CM

## 2011-05-11 DIAGNOSIS — I10 Essential (primary) hypertension: Secondary | ICD-10-CM

## 2011-05-11 DIAGNOSIS — I428 Other cardiomyopathies: Secondary | ICD-10-CM

## 2011-05-11 DIAGNOSIS — K859 Acute pancreatitis without necrosis or infection, unspecified: Secondary | ICD-10-CM

## 2011-05-11 DIAGNOSIS — R1032 Left lower quadrant pain: Secondary | ICD-10-CM

## 2011-05-11 LAB — LIPASE: Lipase: 36 U/L (ref 11.0–59.0)

## 2011-05-11 NOTE — Assessment & Plan Note (Signed)
asx

## 2011-05-11 NOTE — Assessment & Plan Note (Addendum)
Intolerant to most medications including zetia, recent trial with Pravachol failed .  Recent trial w/ Niaspan--> caused HAs Unable to treat, diet-exercise recommend

## 2011-05-11 NOTE — Progress Notes (Signed)
  Subjective:    Patient ID: [name], female    DOB: 1929-12-26, 77 y.o.   MRN: [id number]  HPI Routine followup Status post cholecystectomy last month, doing well, saw her surgeon recently and was discharged She is still quite concerned about her pancreas likes to "know" that is back to normal  Past Medical History: Headache - migraines, codeine prn Hypertension Hyperlipidemia Osteoporosis HEART, CV: --Mild nonobstructive disease on cath in 2003.  --Probable Takotsubo cardiomyopathy: Severe chest pain with normal cath in 1994.  Severe chest pain in 2003 with widespread T wave inversions on ECG.  Cath with minimal coronary disease but LV-gram showed periapical severe hypokinesis and basilar hyperkinesia (EF 40%).  Last echo in 4/09 confirmed full LV functional recovery with EF 60%, no regional wall motion abnormalities, mild to moderate LVH.  H/O CVA ---------------------------------------------- GI: GERD Hiatal hernia Hemorrhoids Hyperplastic colon polyp, 06/2001 Functional LLQ pain h/o diverticuli ----------------------------------------------- Obstructive sleep apnea Varicose veins h/o R leg DVT after a venous ablation OA with bilateral knee pain 4-09  L2 fracture status post vertebroplasty of L2 September 10, 2007 performed  by IR Depression/anxiety -lost husband   Past Surgical History: Appendectomy left ovarian cyst 3/07 - stress test neg Tonsillectomy Back Surgery Tubal Ligation Dental extraction  L maxillary molar Kyphoplasty 08/2007  left total knee replacement 06/2010 Cholecystectomy, 03-2011   Review of Systems She has a long history of left lower quadrant abdominal pain, she self discontinued ASA few days ago and "the pain is better" Medication list is reviewed  --->good  Compliance w/meds. Takes Lasix very rarely. Ambulatory blood pressure varies from 111/77-164/77.     Objective:   Physical Exam  Constitutional: She appears well-developed and  well-nourished.  HENT:  Head: Normocephalic and atraumatic.  Cardiovascular: Normal rate, regular rhythm and normal heart sounds.   No murmur heard. Pulmonary/Chest: Effort normal and breath sounds normal. No respiratory distress. She has no wheezes. She has no rales.  Musculoskeletal: She exhibits no edema.      Assessment & Plan:

## 2011-05-11 NOTE — Assessment & Plan Note (Signed)
Recent GB stone pancreatitis, likes to be sure everything is ok Labs to reassure the patient

## 2011-05-11 NOTE — Assessment & Plan Note (Addendum)
H/o functional LLQ abd pain, today states pain better after she stopped ASA 3 days ago rec : re-introduce ASA in 2 weeks and see if pain is really related to ASA

## 2011-05-11 NOTE — Assessment & Plan Note (Signed)
BP occ elevated at home, normal in the office, no change

## 2011-05-13 ENCOUNTER — Encounter: Payer: Self-pay | Admitting: Internal Medicine

## 2011-05-15 HISTORY — PX: BREAST LUMPECTOMY: SHX2

## 2011-05-23 DIAGNOSIS — G43809 Other migraine, not intractable, without status migrainosus: Secondary | ICD-10-CM | POA: Diagnosis not present

## 2011-06-11 DIAGNOSIS — S9030XA Contusion of unspecified foot, initial encounter: Secondary | ICD-10-CM | POA: Diagnosis not present

## 2011-06-11 DIAGNOSIS — S8000XA Contusion of unspecified knee, initial encounter: Secondary | ICD-10-CM | POA: Diagnosis not present

## 2011-06-21 ENCOUNTER — Encounter: Payer: Self-pay | Admitting: Cardiology

## 2011-06-21 ENCOUNTER — Ambulatory Visit (INDEPENDENT_AMBULATORY_CARE_PROVIDER_SITE_OTHER): Payer: Medicare Other | Admitting: Cardiology

## 2011-06-21 VITALS — BP 124/64 | HR 75 | Ht 67.0 in | Wt 174.8 lb

## 2011-06-21 DIAGNOSIS — I509 Heart failure, unspecified: Secondary | ICD-10-CM | POA: Diagnosis not present

## 2011-06-21 DIAGNOSIS — I10 Essential (primary) hypertension: Secondary | ICD-10-CM

## 2011-06-21 DIAGNOSIS — I428 Other cardiomyopathies: Secondary | ICD-10-CM | POA: Diagnosis not present

## 2011-06-21 DIAGNOSIS — E785 Hyperlipidemia, unspecified: Secondary | ICD-10-CM

## 2011-06-21 DIAGNOSIS — I5032 Chronic diastolic (congestive) heart failure: Secondary | ICD-10-CM

## 2011-06-21 NOTE — Patient Instructions (Signed)
Your physician recommends that you return for a FASTING lipid profile/liver profile  428.32  Your physician wants you to follow-up in: 1 year with Dr Shirlee Latch. You will receive a reminder letter in the mail two months in advance. If you don't receive a letter, please call our office to schedule the follow-up appointment.

## 2011-06-23 NOTE — Assessment & Plan Note (Signed)
History of probable Takotsubo cardiomyopathy.  EF was normal on last echo in 8/12.

## 2011-06-23 NOTE — Assessment & Plan Note (Signed)
Check lipids. ? Trial of very low dose of Crestor.

## 2011-06-23 NOTE — Assessment & Plan Note (Signed)
BP is now under good control.  No change to regimen.

## 2011-06-23 NOTE — Progress Notes (Signed)
PCP: Dr. Drue Novel  78 yo with history of HTN, prior stroke, and prior cardiomyopathy presents for cardiology followup.  She had an episode of severe chest pain in 1994 and had a heart catheterization at that time that per her report showed no heart artery blockages. In 2003, she had another episode of sudden onset chest and back pain. She does not remember what triggered the episode. She had extensive T wave inversions on ECG. Left heart cath showed nonobstructive CAD but left ventriculogram showed periapical severe hypokinesis with basal hyperkinesis. At the time, it was thought that she had had a large MI from LAD occlusion with spontaneous recanalization of the vessel. However, I suspect that she probably had a stress (Takotsubo) cardiomyopathy as her ECG and left heart cath fit this picture fairly well.  In 2/12, Lexiscan myoview was negative for ischemia or infarction.  In 8/12, echo showed normal EF.   Since I last saw her, she has been doing fairly well.  Blood pressure has been under good control.  No chest pain.  She was unable to tolerate pravastatin.   She can walk on flat ground without dyspnea but is short of breath with steps.  She is doing water aerobics two times a week.   Labs (3/11): K 3.9, creatinine 0.8, normal TSH  Labs (8/12): K 4.5, creatinine 0.66, LDL 124, HDL 53 Labs (10/12): BNP 249 Labs (11/12): K 3.8, creatinine 0.71  1) ! Pcn  2) ! Asa  3) ! Accupril  4) ! * Toprol  5) ! Lescol  6) ! Zocor  7) ! Lipitor  8) ! * Zetia  9) ! * Crestor  10) ! Milk of Magnesia (Magnesium Hydroxide)  11) ! Diovan   Past Medical History:  1. Headache - migraines  2. Hypertension  3. Hyperlipidemia: GI upset with atorvastatin, simvastatin, pravastatin, and Crestor 4. Osteoporosis  5. Coronary artery disease: Mild nonobstructive disease on cath in 2003.  6 CVA  7. GI issues: GERD Hiatal hernia, Hemorrhoids, Hyperplastic colon polyp, 06/2001, Functional LLQ pain, h/o diverticuli  8.  Obstructive sleep apnea  9. varicose veins  10. h/o R leg DVT after a venous ablation  11. OA with bilateral knee pain  12. 4-09 L2 fracture status post vertebroplasty of L2 September 10, 2007 performed by IR  13. Depression/anxiety -lost husband  27. Probable Takotsubo cardiomyopathy: Severe chest pain with normal cath in 1994. Severe chest pain in 2003 with widespread T wave inversions on ECG. Cath with minimal coronary disease but LV-gram showed periapical severe hypokinesis and basilar hyperkinesia (EF 40%). Echo in 4/09 confirmed full LV functional recovery with EF 60%, no regional wall motion abnormalities, mild to moderate LVH.  Lexiscan myoview in 2/12 showed no ischemia or infarction.  Last echo was in 8/12 and showed EF 55-60%, mild focal basal septal hypertrophy, grade I diastolic dysfunction, mild basal posterolateral hypokinesis. 15. Carotid dopplers (10/12): minimal disease.  16. Cholecystectomy  Family History:  colon polyps--cousin  colon ca-- cousin  Family History of Breast Cancer: Mother, Sister  Family History of Heart Disease, MI s: Father  Mother Multiple Myeloma   Social History:  Married, lost husband 07-29-07. Lives in [place].  lives by self  driving  Former Smoker: quit 1990  Alcohol use-yes  children x 3 ( 2 in [place] , see them occasionally)  Daily Caffeine Use coffee    Current Outpatient Prescriptions  Medication Sig Dispense Refill  . acetaminophen-codeine (TYLENOL #3) 300-30 MG per tablet TAKE  1 TABLET BY MOUTH EVERY 8 HOURS AS NEEDED FOR MIGRAINE  90 tablet  0  . amLODipine (NORVASC) 5 MG tablet Take 5 mg by mouth daily.      Marland Kitchen aspirin 81 MG tablet Take 160 mg by mouth daily.      . Calcium Carbonate Antacid (TUMS ULTRA 1000 PO) Take 2 tablets by mouth daily.       . cholecalciferol (VITAMIN D) 1000 UNITS tablet Take 1,000 Units by mouth daily.        Marland Kitchen docusate sodium (COLACE) 100 MG capsule Take 100 mg by mouth daily as needed. For constipation      .  furosemide (LASIX) 20 MG tablet Take 20 mg by mouth daily as needed. For fluid retention      . LORazepam (ATIVAN) 0.5 MG tablet TAKE 1 TABLET BY MOUTH THREE TIMES DAILY AS NEEDED  90 tablet  2  . LUTEIN PO Take 1 tablet by mouth daily.        . metoprolol (LOPRESSOR) 50 MG tablet Take 25 mg by mouth 2 (two) times daily.        Marland Kitchen omeprazole (PRILOSEC) 40 MG capsule Take 40 mg by mouth daily.        . vitamin E 400 UNIT capsule Take 400 Units by mouth daily.         BP 124/64  Pulse 75  Ht 5\' 7"  (1.702 m)  Wt 79.289 kg (174 lb 12.8 oz)  BMI 27.38 kg/m2 General: NAD Neck: No JVD, no thyromegaly or thyroid nodule.  Lungs: Clear to auscultation bilaterally with normal respiratory effort. CV: Nondisplaced PMI.  Heart regular S1/S2, no S3/S4, no murmur.  No peripheral edema.  No carotid bruit.  Normal pedal pulses.  Bilateral varicose veins.  Abdomen: Soft, nontender, no hepatosplenomegaly, no distention.  Neurologic: Alert and oriented x 3.  Psych: Normal affect. Extremities: No clubbing or cyanosis.

## 2011-06-25 ENCOUNTER — Other Ambulatory Visit (INDEPENDENT_AMBULATORY_CARE_PROVIDER_SITE_OTHER): Payer: Medicare Other | Admitting: *Deleted

## 2011-06-25 DIAGNOSIS — I509 Heart failure, unspecified: Secondary | ICD-10-CM | POA: Diagnosis not present

## 2011-06-25 DIAGNOSIS — I5032 Chronic diastolic (congestive) heart failure: Secondary | ICD-10-CM | POA: Diagnosis not present

## 2011-06-25 LAB — LIPID PANEL
Cholesterol: 213 mg/dL — ABNORMAL HIGH (ref 0–200)
HDL: 57.2 mg/dL (ref >39.00)
Total CHOL/HDL Ratio: 4
Triglycerides: 101 mg/dL (ref 0.0–149.0)
VLDL: 20.2 mg/dL (ref 0.0–40.0)

## 2011-06-25 LAB — HEPATIC FUNCTION PANEL
ALT: 13 U/L (ref 0–35)
AST: 20 U/L (ref 0–37)
Albumin: 3.9 g/dL (ref 3.5–5.2)
Alkaline Phosphatase: 82 U/L (ref 39–117)
Bilirubin, Direct: 0.1 mg/dL (ref 0.0–0.3)
Total Bilirubin: 1.3 mg/dL — ABNORMAL HIGH (ref 0.3–1.2)
Total Protein: 6.8 g/dL (ref 6.0–8.3)

## 2011-06-25 LAB — LDL CHOLESTEROL, DIRECT: Direct LDL: 142.3 mg/dL

## 2011-07-02 ENCOUNTER — Telehealth: Payer: Self-pay | Admitting: *Deleted

## 2011-07-02 DIAGNOSIS — E785 Hyperlipidemia, unspecified: Secondary | ICD-10-CM

## 2011-07-02 DIAGNOSIS — I5032 Chronic diastolic (congestive) heart failure: Secondary | ICD-10-CM

## 2011-07-02 MED ORDER — ROSUVASTATIN CALCIUM 5 MG PO TABS: ORAL_TABLET | ORAL | Status: DC

## 2011-07-02 NOTE — Telephone Encounter (Signed)
Notes Recorded by Jacqlyn Krauss, RN on 07/02/2011 at 9:06 AM Discussed with pt. She agreed to try Crestor 5mg  every other day. She will take coenzyme Q10 200mg  daily. She is scheduled for fasting lab at [facility] with Dr Drue Novel 08/27/11. ------  Notes Recorded by Marca Ancona, MD on 06/29/2011 at 11:49 PM LDL high. Would try very low dose of Crestor, 5 mg every other day with coenzyme Q 10 200 mg daily.

## 2011-07-13 IMAGING — CR DG CHEST 1V PORT
1 series · 1 of 1 positions shown · non-contrast
Comparison: Two-view chest x-ray 09/07/2007.

CLINICAL DATA: Fever.  Shortness of breath.  Fatigue.

PORTABLE CHEST - 1 VIEW [DATE]/[id number] hours:

[view not recorded]
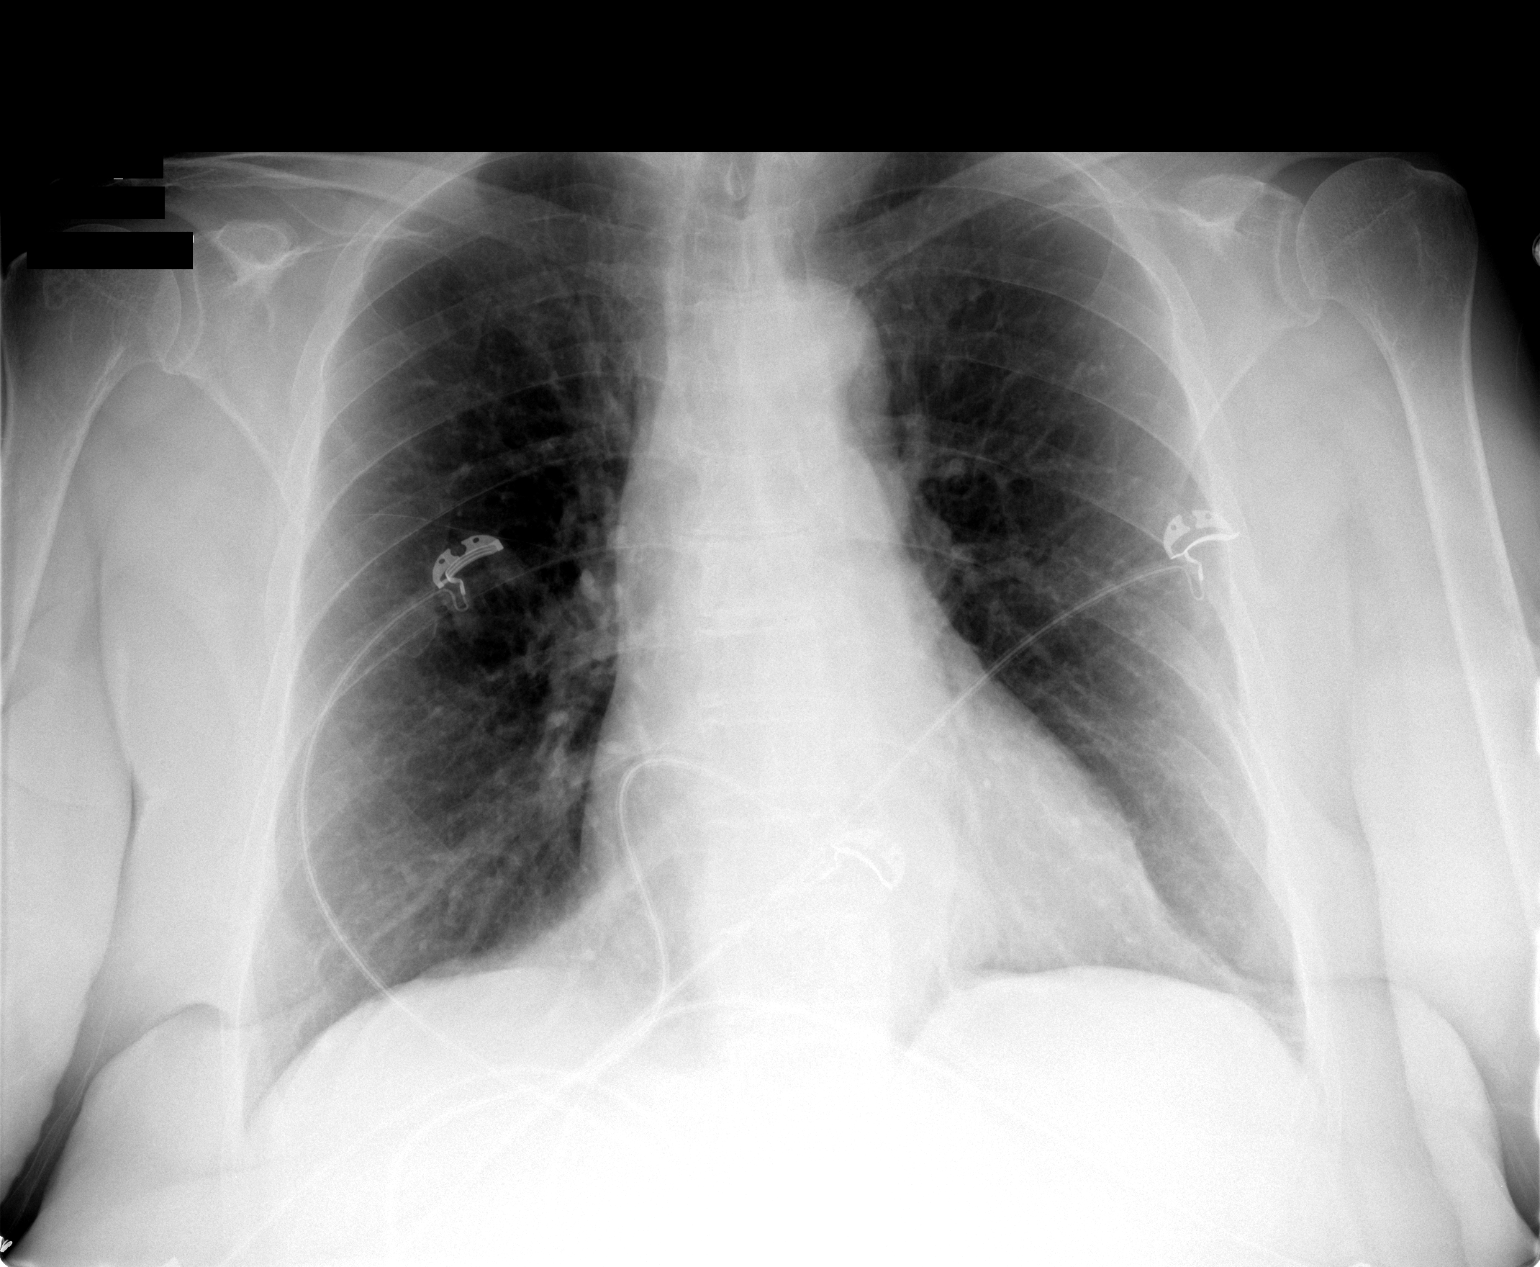

[1 of 1 positions shown; findings below may reference images not displayed]

FINDINGS: Heart size upper normal and stable.  Minimal linear
atelectasis deep at the left lung base.  Lungs otherwise clear.  No
pleural effusions.  Small hiatal hernia again noted.
IMPRESSION: Minimal linear atelectasis at the left lung base.  No acute
cardiopulmonary disease otherwise.  Hiatal hernia.

## 2011-07-16 IMAGING — US US RENAL
1 series · 14 of 25 positions shown · non-contrast
Comparison: None

CLINICAL DATA: Urinary tract infection, possible cystitis

RENAL/URINARY TRACT ULTRASOUND COMPLETE

[Series 1: us renal · 0.28mm/px · 14 of 44 slices shown]
[im 1/44]
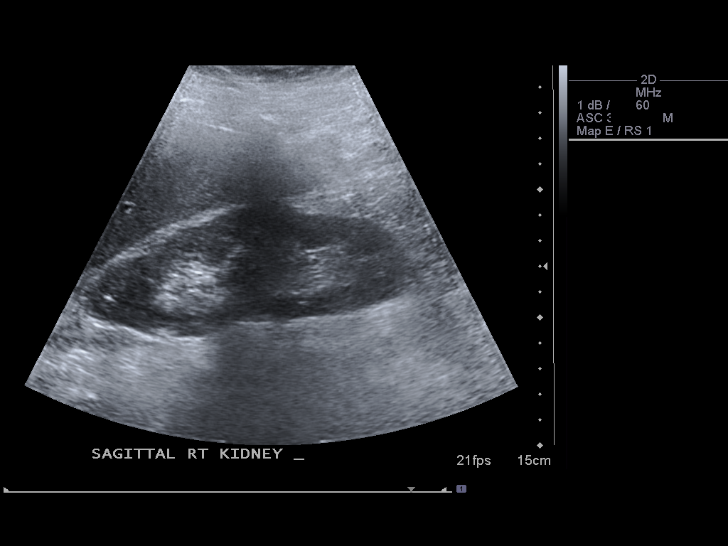
[im 4/44]
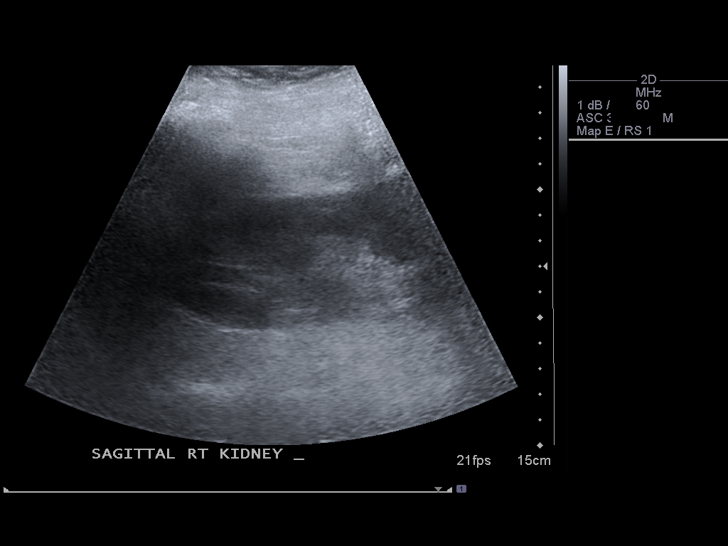
[im 8/44]
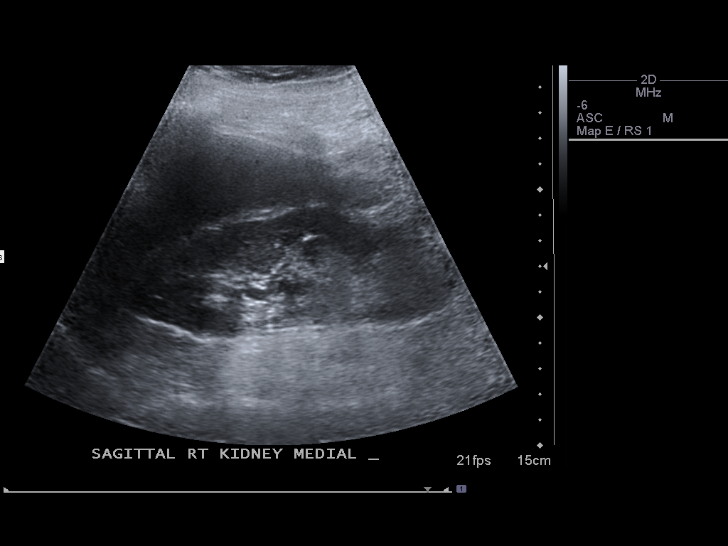
[im 11/44]
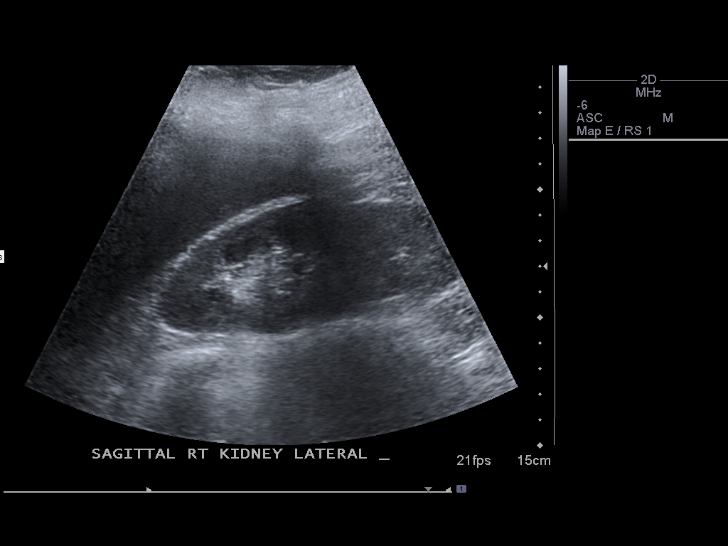
[im 15/44]
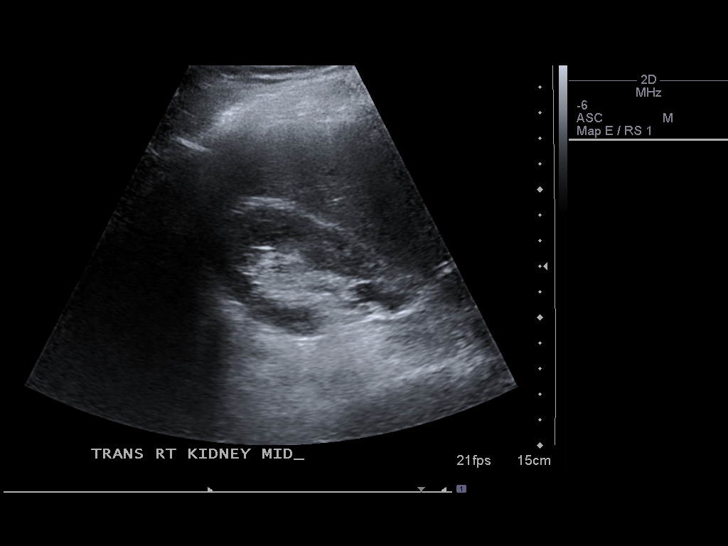
[im 17/44]
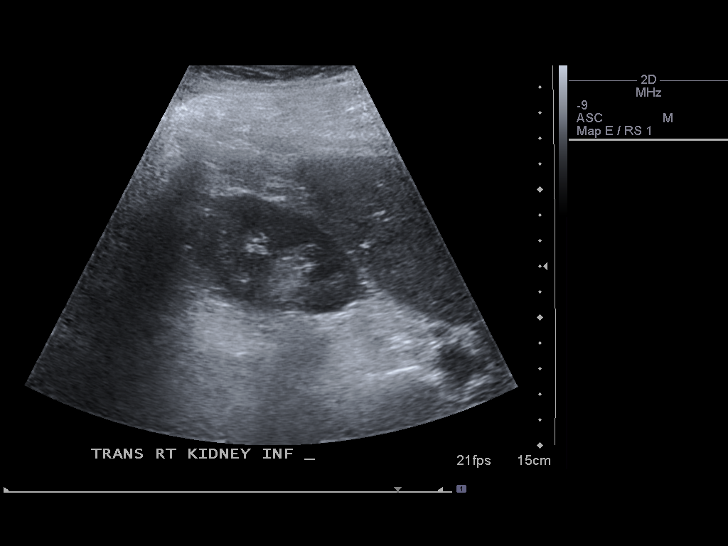
[im 20/44]
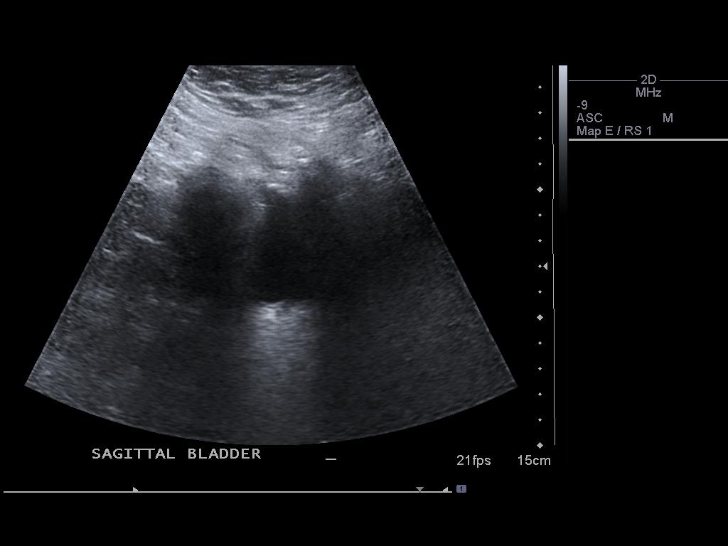
[im 24/44]
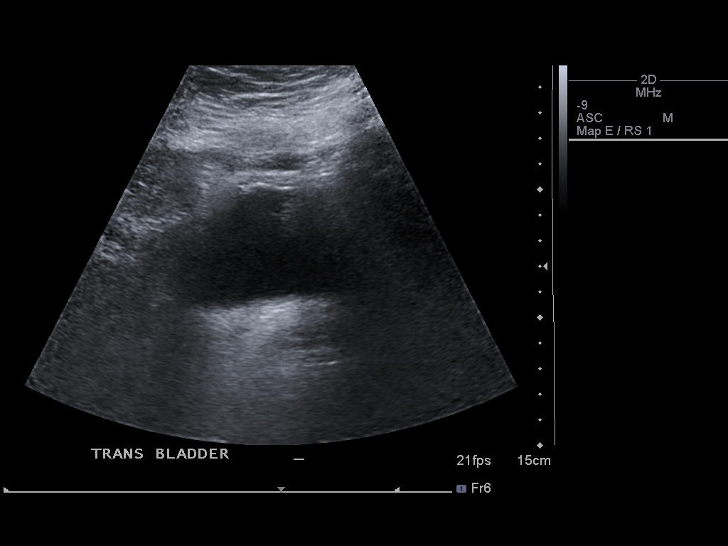
[im 27/44]
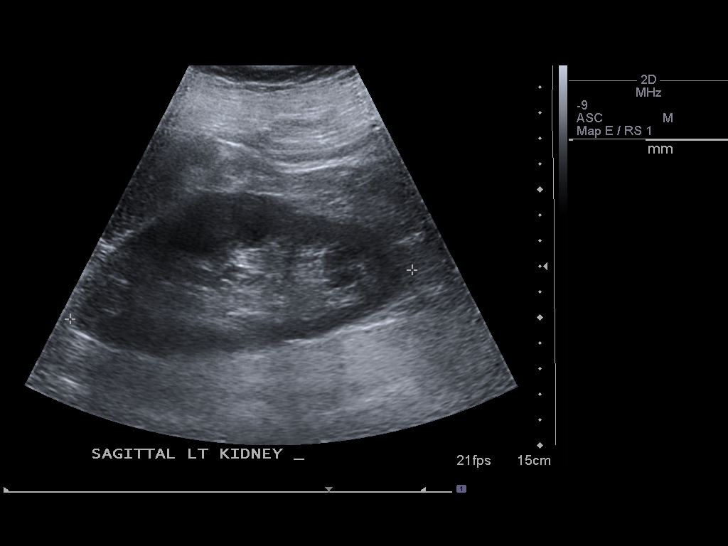
[im 29/44]
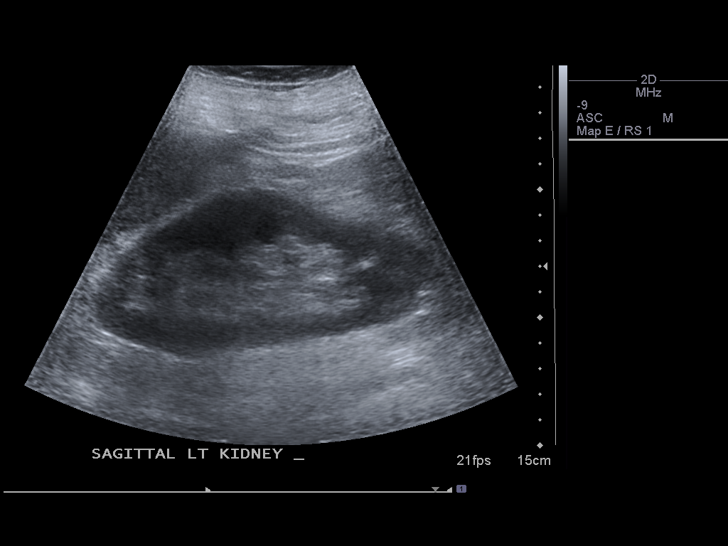
[im 33/44]
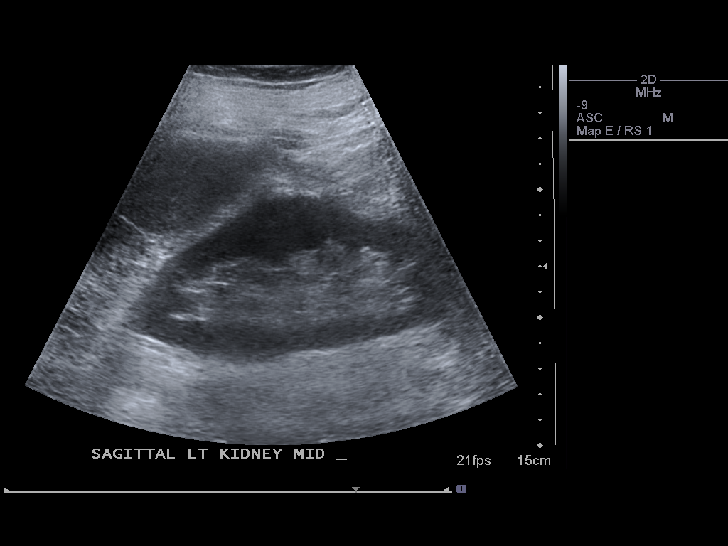
[im 36/44]
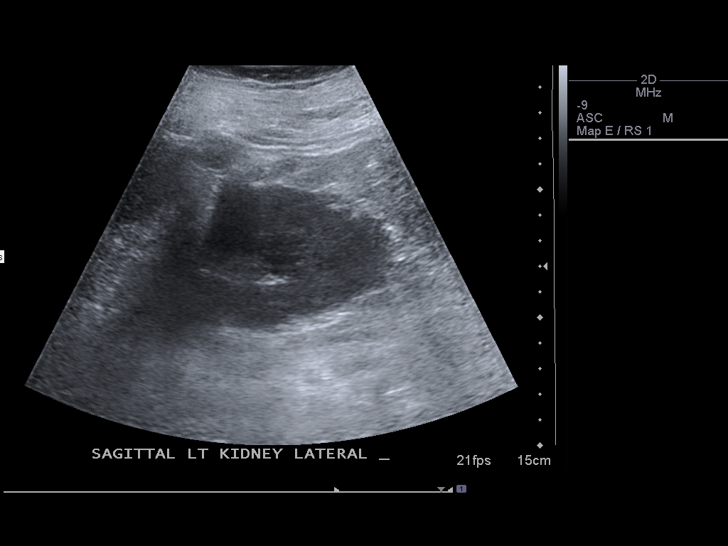
[im 40/44]
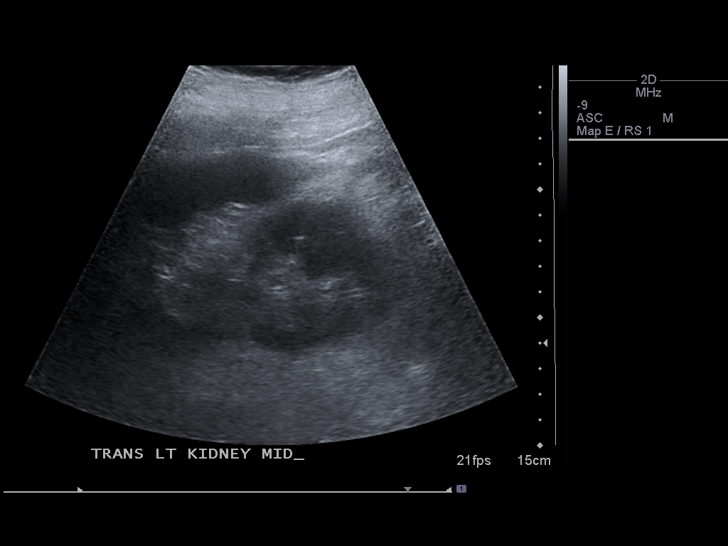
[im 44/44]
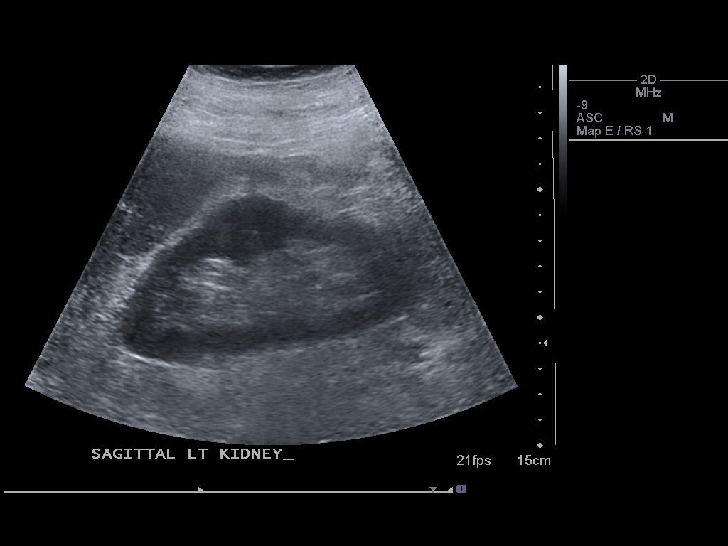

[14 of 25 positions shown; findings below may reference images not displayed]

FINDINGS: Right Kidney:  No hydronephrosis is seen.  The right kidney
measures 13.3 cm sagittally.

Left Kidney:  No hydronephrosis and the left kidney measures
cm.

Bladder:  The urinary bladder is decompressed, and cannot be
evaluated.
IMPRESSION: No hydronephrosis.

## 2011-07-17 IMAGING — CR DG CHEST 2V
2 series · 2 of 2 positions shown · non-contrast
Comparison: 12/27/2008

CLINICAL DATA: Fever, 52, short of breath.  Ischemic
cardiomyopathy.

CHEST - 2 VIEW

[w chest pa]
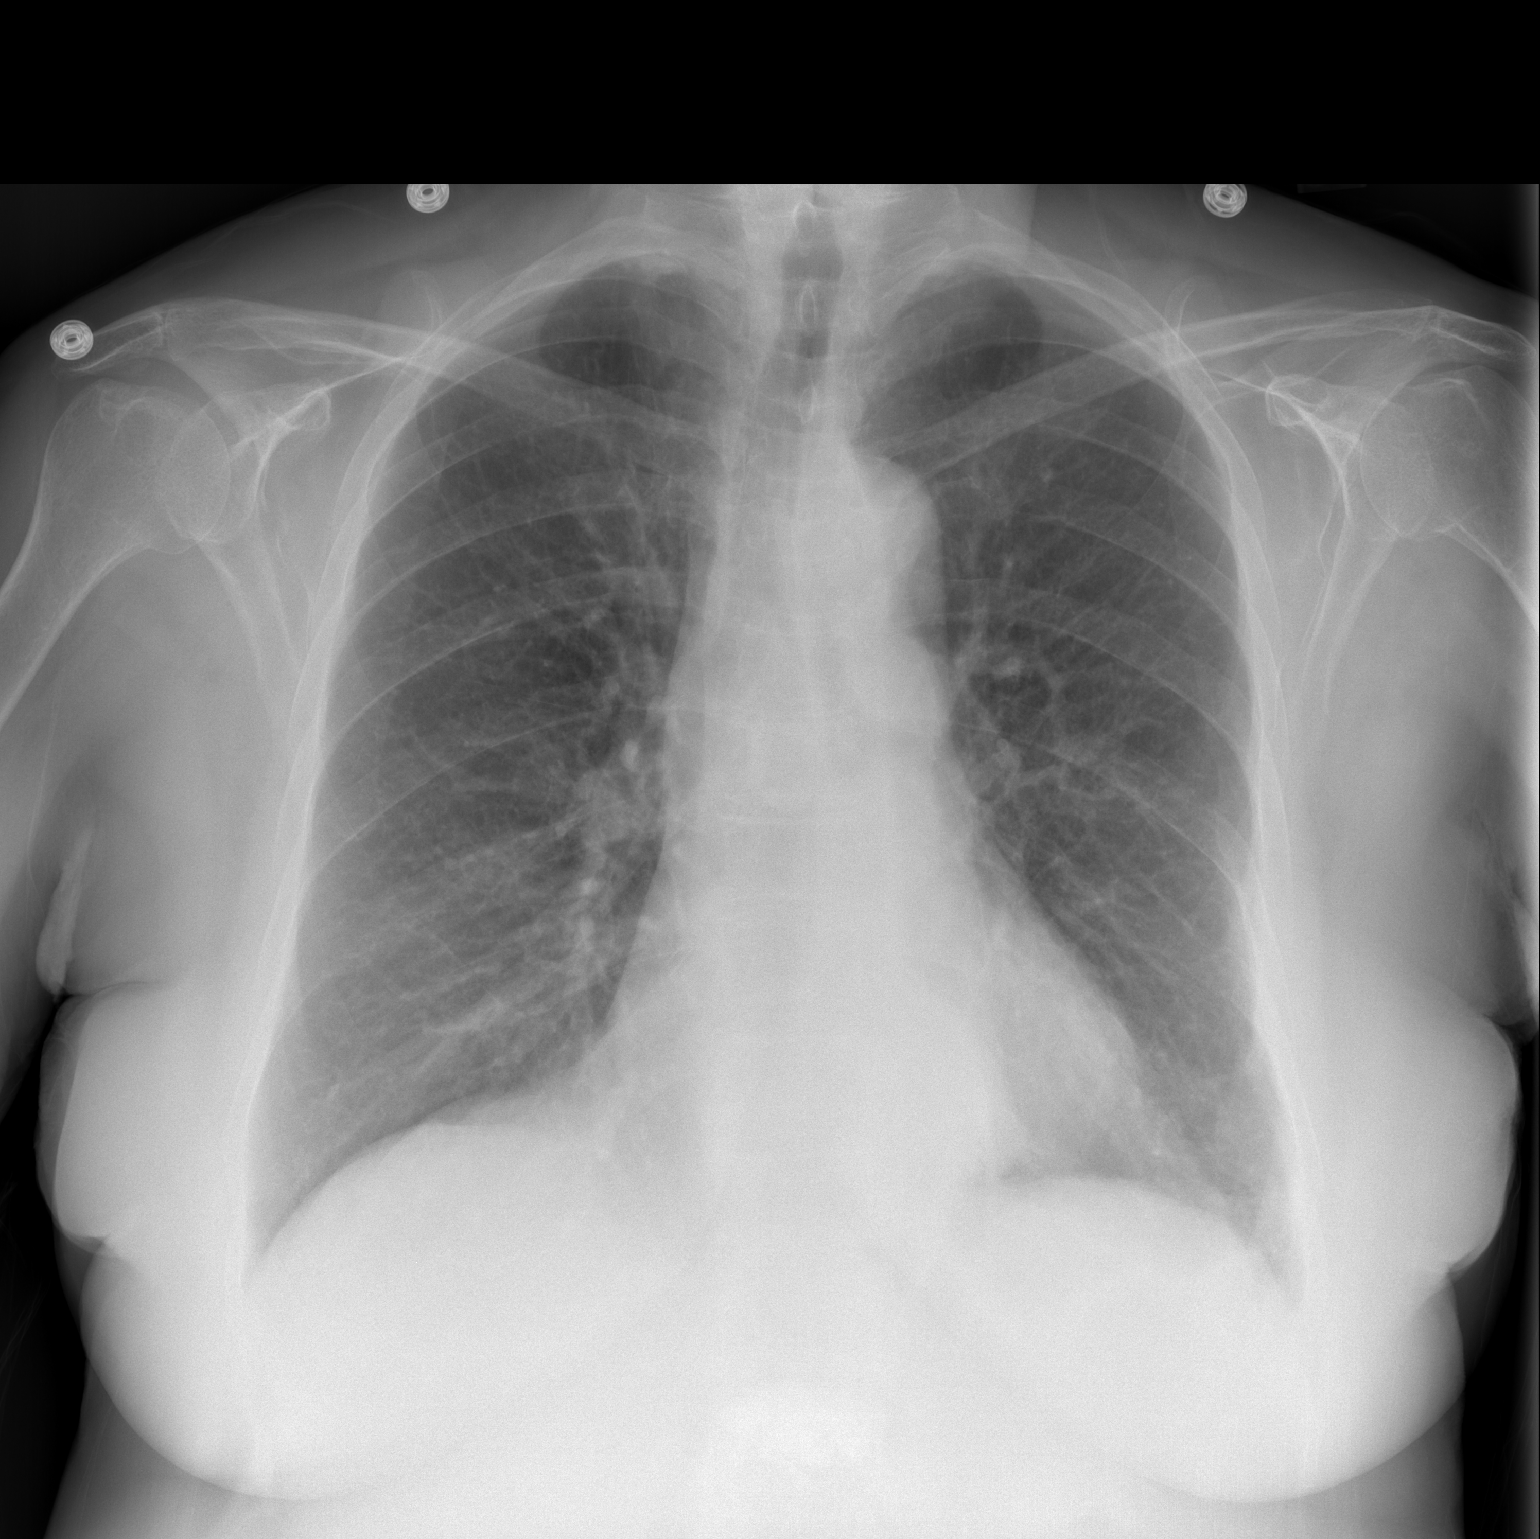

[w chest lat]
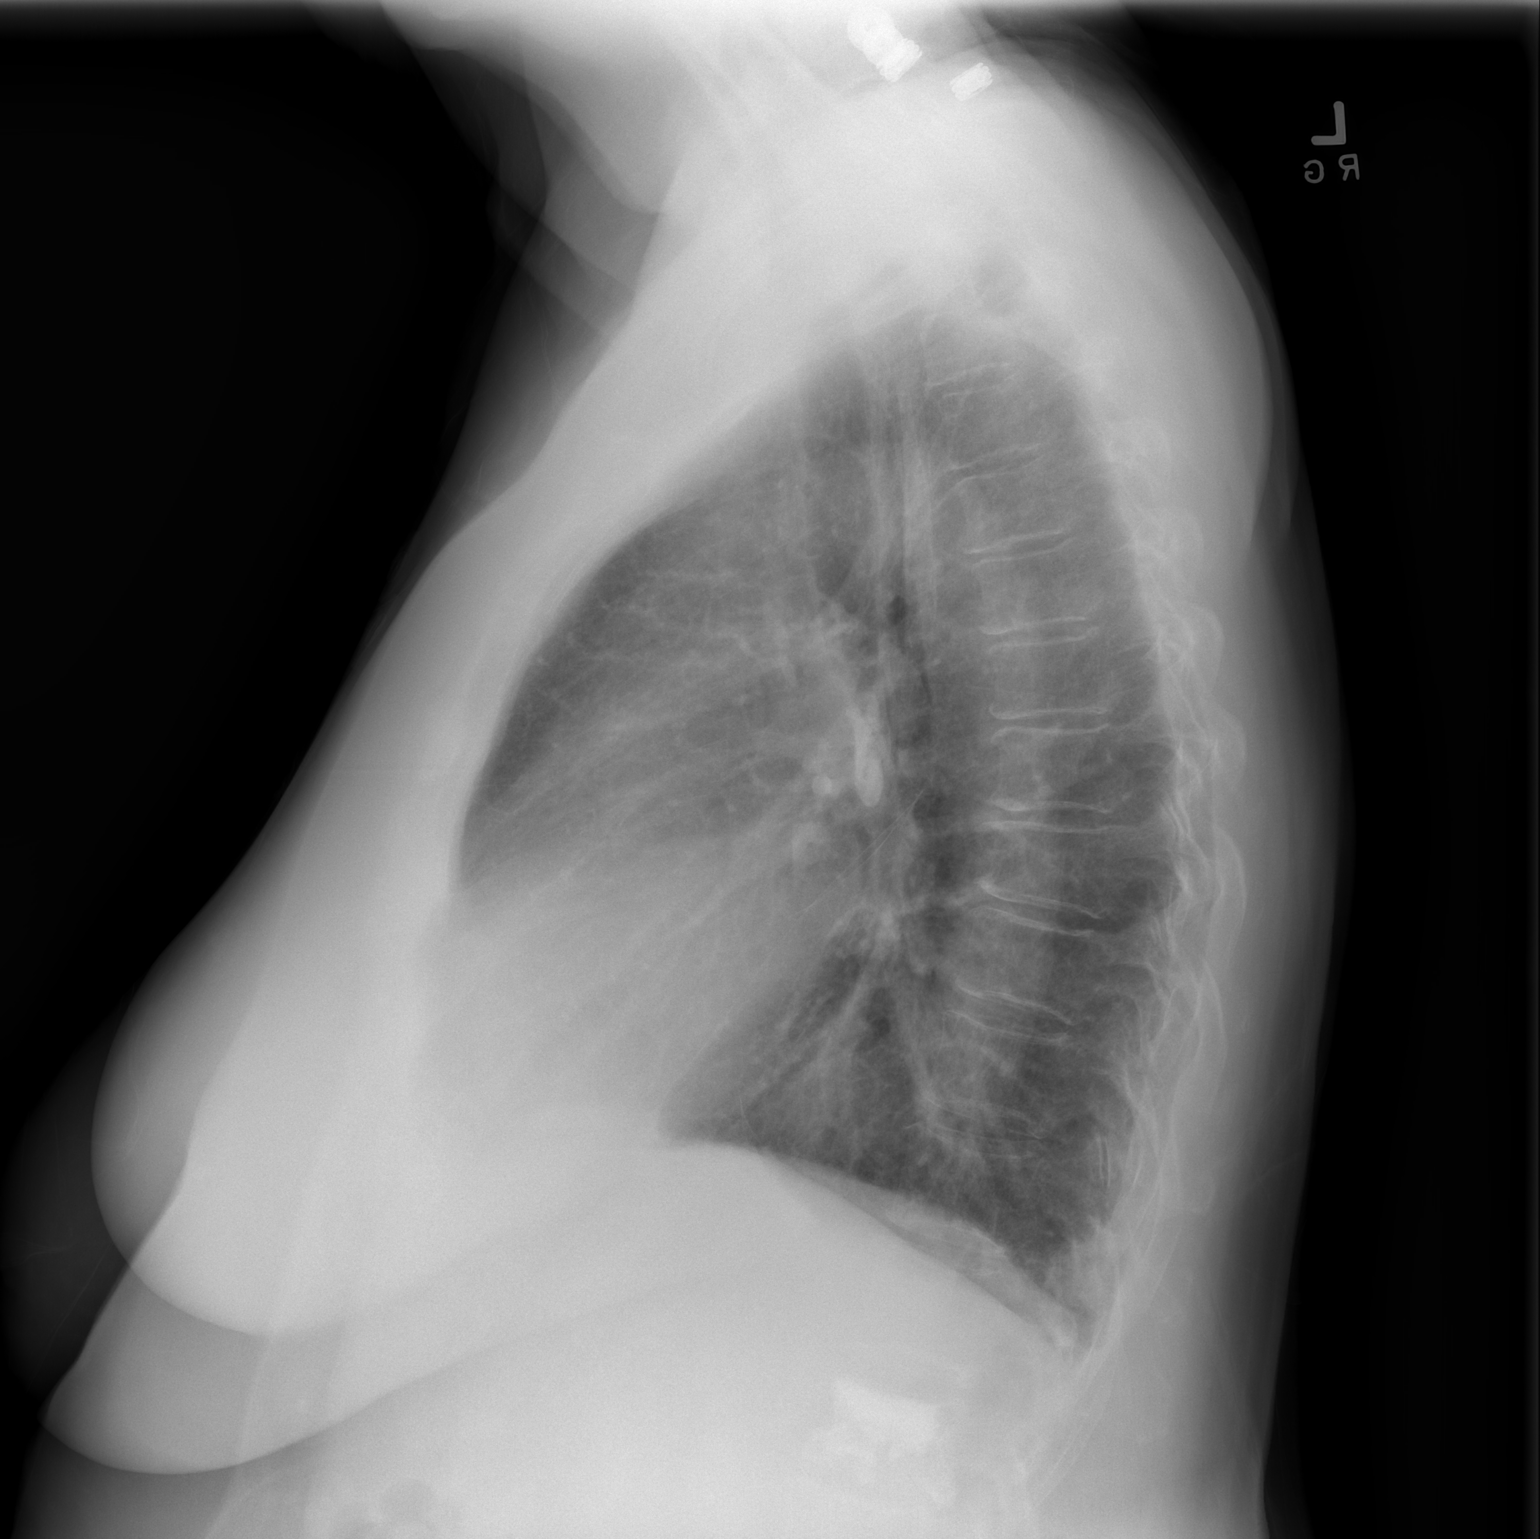

[2 of 2 positions shown; findings below may reference images not displayed]

FINDINGS: Mild diffuse increase in interstitial markings
representing a chronic finding.  Lungs are hyperaerated.  Hiatal
hernia.  Normal cardiac size.  No acute chest findings. Upper
lumbar vertebroplasty site.
IMPRESSION: COPD.  Hiatal hernia.

## 2011-07-19 ENCOUNTER — Ambulatory Visit: Payer: Medicare Other | Admitting: Family Medicine

## 2011-07-19 ENCOUNTER — Telehealth: Payer: Self-pay | Admitting: *Deleted

## 2011-07-19 NOTE — Telephone Encounter (Signed)
Pt has pending appt for tomorrow. 

## 2011-07-19 NOTE — Telephone Encounter (Signed)
[facility] Triage Call Report Triage Record Num: [id number] Operator: Baldomero Lamy Patient Name: [name] Call Date & Time: 07/19/2011 3:56:10PM Patient Phone: [phone] PCP: Nolon Rod. Paz Patient Gender: Female PCP Fax : Patient DOB: 1929-11-06 Practice Name: [facility] Reason for Call: Caller: Dinesha/Patient; PCP: Willow Ora; CB#: [phone]; ; ; Call regarding Sore Throat; Pt calling regarding sore throat-onset 3/5. Home tx not effective-OTC. Afebrile. Pt requests an appt. Emergent sxs of Sore Throat or Hoarseness r/o. Disp: 24 hrs. Made appt. in Epic for 3/8 at 0930 with Dr. Beverely Low. Protocol(s) Used: Sore Throat or Hoarseness Recommended Outcome per Protocol: See Provider within 24 hours Reason for Outcome: Sore throat not responding to 7 days of home care Care Advice: ~ Speak with provider if temperature elevation of 100.5 F (38 C) or above. For a fever during pregnancy, take temperature every 4 hours while awake, or more often with chills/sweats, until temperature returns to normal for 24 hours. ~ To help prevent the spread of infection, do not share eating or drinking utensils, personal care items like a toothbrush, or food. Wash hands often with soap and water or alcohol-based hand rub. ~ Avoid exposure to environmental irritants. Do not smoke and avoid second-hand smoke. Avoid outside activities on high pollution days. Instead of strong smelling commercial cleaning products, substitute vinegar and lemon juice. ~ ~ Stay home until your temperature returns to normal. Antibiotics may be prescribed by a provider after confirmation of strep throat or in a high risk situation, until a diagnosis is confirmed. Inform provider of any previous reaction or sensitivity to penicillin. Complete entire course of antibiotics, even when feeling better, if ordered by your provider. ~ Continue to follow your treatment plan. Take all medications as prescribed and  keep all appointments with your provider. ~ ~ Cover mouth and nose tightly with tissue when sneezing or coughing, or cough into your elbow. ~ HEALTH PROMOTION / MAINTENANCE ~ Call provider if temperature over 101.5 F (38.6 C) or symptoms become worse. ~ SYMPTOM / CONDITION MANAGEMENT ~ INFECTION CONTROL ~ CAUTIONS During pregnancy or when breastfeeding, do not take nonprescription, complementary/alternative medication(s) without the approval of provider ~ Go to the ED if new onset of stiff neck (unable to touch chin to chest), generalized headache, change in mental status, difficulty opening mouth, unable to swallow liquids or signs of dehydration. ~ Most adults need to drink 6-10 eight-ounce glasses (1.2-2.0 liters) of fluids per day unless previously told to limit fluid intake for other medical reasons. Limit fluids that contain caffeine, sugar or alcohol. Urine will be a very light yellow color when you drink enough fluids. ~ Analgesic/Antipyretic Advice - Acetaminophen: Consider acetaminophen as directed on label or by pharmacist/provider for pain or fever PRECAUTIONS: - Use if there is no history of liver disease, alcoholism, or intake of three or more alcohol drinks per day - Only if approved by provider during pregnancy or when breastfeeding - During pregnancy, acetaminophen should not be taken more than 3 consecutive days without telling provider - Do not exceed recommended dose or frequency ~ 07/19/2011 4:17:43PM Page 1 of 2 CAN_[id number]_V2 [facility] Triage Call Report Patient Name: [name] continuation page/s Sore Throat Relief: - Use warm salt water gargles 3 to 4 times/day, as needed (1/2 tsp. salt in 8 oz. [.2 liters] water). - Suck on hard candy, nonprescription or herbal throat lozenges (sugar-free if diabetic) - Eat soothing, soft food/fluids (broths, soups, or honey and lemon juice in hot tea, Popsicles, frozen  yogurt or sherbet, scrambled eggs, cooked  cereals, Jell-O or puddings) whichever is most comforting. - Avoid eating salty, spicy or acidic foods. ~ ~ Rest until symptoms improve. If more than [redacted] weeks pregnant, lie on left side when resting. To help relieve nasal congestion, use nonprescription saline nasal spray according to label instructions or as directed by a healthcare provider. ~ If pregnancy is known or suspected, get advice from provider before using any nonprescription medication other than acetaminophen ~ Analgesic/Antipyretic Advice - NSAIDs: Consider aspirin, ibuprofen, naproxen or ketoprofen for pain or fever as directed on label or by pharmacist/provider. PRECAUTIONS: - If over 76 years of age, should not take longer than 1 week without consulting provider. EXCEPTIONS: - Should not be used if taking blood thinners or have bleeding problems. - Do not use if have history of sensitivity/allergy to any of these medications; or history of cardiovascular, ulcer, kidney, liver disease or diabetes unless approved by provider. - Do not exceed recommended dose or frequency. ~ 07/19/2011 4:17:43PM Page 2 of 2 CAN_TriageRpt_V2

## 2011-07-20 ENCOUNTER — Encounter: Payer: Self-pay | Admitting: Family Medicine

## 2011-07-20 ENCOUNTER — Ambulatory Visit (INDEPENDENT_AMBULATORY_CARE_PROVIDER_SITE_OTHER): Payer: Medicare Other | Admitting: Family Medicine

## 2011-07-20 VITALS — BP 128/80 | HR 72 | Temp 98.6°F | Wt 176.2 lb

## 2011-07-20 DIAGNOSIS — J069 Acute upper respiratory infection, unspecified: Secondary | ICD-10-CM | POA: Diagnosis not present

## 2011-07-20 DIAGNOSIS — J029 Acute pharyngitis, unspecified: Secondary | ICD-10-CM

## 2011-07-20 LAB — POCT RAPID STREP A (OFFICE): Rapid Strep A Screen: NEGATIVE

## 2011-07-20 MED ORDER — AZITHROMYCIN 250 MG PO TABS: ORAL_TABLET | ORAL | Status: AC

## 2011-07-20 MED ORDER — BENZONATATE 200 MG PO CAPS: 200.0000 mg | ORAL_CAPSULE | Freq: Three times a day (TID) | ORAL | Status: DC | PRN

## 2011-07-20 NOTE — Patient Instructions (Signed)
Start the Azithromycin to treat a bronchitis Drink plenty of fluids Use the cough pills as needed REST! Call with any questions or concerns Hang in there!

## 2011-07-20 NOTE — Assessment & Plan Note (Signed)
New.  Likely viral but pt is flying to [place] next week and wants abx to 'be sure I'm well'.  Will start Zpack, cough meds prn.  Reviewed supportive care and red flags that should prompt return.  Pt expressed understanding and is in agreement w/ plan.

## 2011-07-20 NOTE — Progress Notes (Signed)
  Subjective:    Patient ID: [name], female    DOB: 1930-03-08, 78 y.o.   MRN: [id number]  HPI URI- sxs started on Wed w/ laryngitis.  Pt reports hx of perfume allergy and had sprayed new body spray.  + sore throat, slight frontal HA.  No fevers.  No ear pain.  + nasal congestion.  Cough- intermittently productive.  + sick contacts.  Taking OTC Mucus Relief DM w/ some relief.  Remote hx of seasonal allergies.   Review of Systems For ROS see HPI     Objective:   Physical Exam  Vitals reviewed. Constitutional: She appears well-developed and well-nourished. No distress.  HENT:  Head: Normocephalic and atraumatic.  Right Ear: Tympanic membrane normal.  Left Ear: Tympanic membrane normal.  Nose: Mucosal edema and rhinorrhea present. Right sinus exhibits no maxillary sinus tenderness and no frontal sinus tenderness. Left sinus exhibits no maxillary sinus tenderness and no frontal sinus tenderness.  Mouth/Throat: Mucous membranes are normal. Posterior oropharyngeal erythema (w/ PND) present.  Eyes: Conjunctivae and EOM are normal. Pupils are equal, round, and reactive to light.  Neck: Normal range of motion. Neck supple.  Cardiovascular: Normal rate, regular rhythm and normal heart sounds.   Pulmonary/Chest: Effort normal and breath sounds normal. No respiratory distress. She has no wheezes. She has no rales.       + dry cough  Lymphadenopathy:    She has no cervical adenopathy.          Assessment & Plan:

## 2011-07-23 ENCOUNTER — Telehealth: Payer: Self-pay | Admitting: *Deleted

## 2011-07-23 MED ORDER — GUAIFENESIN-CODEINE 100-10 MG/5ML PO SOLN: 5.0000 mL | Freq: Three times a day (TID) | ORAL | Status: DC | PRN

## 2011-07-23 NOTE — Telephone Encounter (Signed)
Will call a cough medicine w/ codeine, remind pt she will get sedated, needs to be careful. cipro is not indicated. If getting worse, needs to be seen

## 2011-07-23 NOTE — Telephone Encounter (Signed)
[facility] Triage Call Report Triage Record Num: [id number] Operator: Freddie Breech Patient Name: [name] Call Date & Time: 07/23/2011 11:30:27AM Patient Phone: [phone] PCP: Nolon Rod. Paz Patient Gender: Female PCP Fax : Patient DOB: July 22, 1929 Practice Name: [facility] Reason for Call: Caller: Imajean/Patient; PCP: Willow Ora; CB#: [phone]; Call regarding Cough/Congestion; Pt was prescribed a Zpack for sinus congestion and a cough. Her sx have not improved. Emergent sx r/o. Appt declined today. Pt would like a cough medication with Codeine and Cipro. OFC NOTE. PLS CALL PT. Protocol(s) Used: Office Note Recommended Outcome per Protocol: Information Noted and Sent to Office Reason for Outcome: Caller information to office Care Advice: ~ 07/23/2011 11:38:14AM

## 2011-07-24 NOTE — Telephone Encounter (Signed)
Discuss with patient, Rx sent. 

## 2011-07-25 ENCOUNTER — Telehealth: Payer: Self-pay | Admitting: *Deleted

## 2011-07-25 NOTE — Telephone Encounter (Signed)
[facility] Triage Call Report Triage Record Num: [id number] Operator: Geanie Berlin Patient Name: [name] Call Date & Time: 07/25/2011 4:18:13PM Patient Phone: [phone] PCP: Nolon Rod. Paz Patient Gender: Female PCP Fax : Patient DOB: Aug 27, 1929 Practice Name: [facility] Reason for Call: Caller: [name]/Patient; PCP: Willow Ora; CB#: [phone]; Call regarding Cough/Congestion; Onset 07/18/11; Productive cough. Going out of country next week. Completed Zpack. Using [employer]. Asking for antibiotic instead of appt. Informed of MD orders that must be seen for antibiotics. Declined triage. Transferred to [facility] for appt for caller requesting appt, triage offered and declined per PCP Call, No Triage Guideline. Protocol(s) Used: PCP Calls, No Triage (Adult) Recommended Outcome per Protocol: Call Provider within 72 Hours Override Outcome if Used in Protocol: Call Provider Immediately RN Reason for Override Outcome: Office Is Open. Reason for Outcome: Caller requesting an appointment, triage offered and declined Care Advice:

## 2011-07-25 NOTE — Telephone Encounter (Signed)
Advised patient, I cannot say over the phone if she needs more antibiotics. Recommend an appointment. Unfortunately I am booked tomorrow, maybe one of my colleagues can see her

## 2011-07-26 ENCOUNTER — Ambulatory Visit (INDEPENDENT_AMBULATORY_CARE_PROVIDER_SITE_OTHER): Payer: Medicare Other | Admitting: Internal Medicine

## 2011-07-26 VITALS — BP 122/82 | HR 80 | Temp 97.6°F | Wt 174.0 lb

## 2011-07-26 DIAGNOSIS — J069 Acute upper respiratory infection, unspecified: Secondary | ICD-10-CM | POA: Diagnosis not present

## 2011-07-26 MED ORDER — AZELASTINE HCL 0.1 % NA SOLN: 1.0000 | Freq: Two times a day (BID) | NASAL | Status: DC

## 2011-07-26 MED ORDER — BENZONATATE 200 MG PO CAPS: 200.0000 mg | ORAL_CAPSULE | Freq: Three times a day (TID) | ORAL | Status: AC | PRN

## 2011-07-26 MED ORDER — DOXYCYCLINE HYCLATE 100 MG PO TABS: 100.0000 mg | ORAL_TABLET | Freq: Two times a day (BID) | ORAL | Status: AC

## 2011-07-26 MED ORDER — GUAIFENESIN-CODEINE 100-10 MG/5ML PO SOLN: 5.0000 mL | Freq: Three times a day (TID) | ORAL | Status: AC | PRN

## 2011-07-26 NOTE — Progress Notes (Signed)
  Subjective:    Patient ID: [name], female    DOB: May 08, 1930, 78 y.o.   MRN: [id number]  HPI Here because of persisting cough. She was seen a few days ago with a URI, prescribed a Z-Pak. Since  then, the headache has decreased, nose congestion has decreased as well but she continue with the cough. Tessalon Perles helped to some extent, codeine definitely help but again she continue with cough. She is leaving to [place] next week and is concerned about these.  Past Medical History:  Headache - migraines, codeine prn  Hypertension  Hyperlipidemia  Osteoporosis  HEART, CV:  --Mild nonobstructive disease on cath in 2003.  --Probable Takotsubo cardiomyopathy: Severe chest pain with normal cath in 1994. Severe chest pain in 2003 with widespread T wave inversions on ECG. Cath with minimal coronary disease but LV-gram showed periapical severe hypokinesis and basilar hyperkinesia (EF 40%). Last echo in 4/09 confirmed full LV functional recovery with EF 60%, no regional wall motion abnormalities, mild to moderate LVH.  H/O CVA  ----------------------------------------------  GI:  GERD Hiatal hernia  Hemorrhoids  Hyperplastic colon polyp, 06/2001  Functional LLQ pain  h/o diverticuli  -----------------------------------------------  Obstructive sleep apnea  Varicose veins  h/o R leg DVT after a venous ablation  OA with bilateral knee pain  4-09 L2 fracture status post vertebroplasty of L2 September 10, 2007 performed by IR  Depression/anxiety -lost husband  Past Surgical History:  Appendectomy  left ovarian cyst  3/07 - stress test neg  Tonsillectomy  Back Surgery  Tubal Ligation  Dental extraction L maxillary molar  Kyphoplasty 08/2007  left total knee replacement 06/2010  Cholecystectomy, 03-2011    Review of Systems No fever chills No GERD symptoms No nausea or vomiting, did have mild diarrhea with antibiotics. Sometimes unable to sleep due to cough. Former smoker, she  quit more than 20 years ago.    Objective:   Physical Exam Alert, oriented x3, nontoxic appearing. HEENT: Nose quite congested, face symmetric, nontender to palpation. Tympanic membranes without redness or discharge. Throat without redness or discharge. Lungs are clear to auscultation bilaterally Cardiovascular regular rate and rhythm without a murmur.     Assessment & Plan:

## 2011-07-26 NOTE — Patient Instructions (Signed)
For cough, start with Robitussin-DM, if the cough continue then use Tessalon Perles You can use codeine  only for severecough , will make you sleepy. Most people use it at night only. Astelin is a nose spray,  very good for nose congestion. Use it for several days. Take doxycycline for one week. Call me or see a local doctor if you are not better.

## 2011-07-26 NOTE — Telephone Encounter (Signed)
Left message to call office

## 2011-07-27 ENCOUNTER — Encounter: Payer: Self-pay | Admitting: Internal Medicine

## 2011-07-27 NOTE — Assessment & Plan Note (Signed)
Symptoms consistent with a URI and persistent cough. No history of asthma. She request as much symptomatic relief as possible and possibly antibiotics. See instructions

## 2011-08-09 ENCOUNTER — Telehealth: Payer: Self-pay | Admitting: Internal Medicine

## 2011-08-09 MED ORDER — METOPROLOL TARTRATE 50 MG PO TABS: 25.0000 mg | ORAL_TABLET | Freq: Two times a day (BID) | ORAL | Status: DC

## 2011-08-09 NOTE — Telephone Encounter (Signed)
Refill: Metoprolol tartrate 50mg  tablets. Take 1 tablet by mouth twice daily. Qty 60. Last fill 07-12-11

## 2011-08-09 NOTE — Telephone Encounter (Signed)
Refill done.  

## 2011-08-24 ENCOUNTER — Telehealth: Payer: Self-pay | Admitting: Internal Medicine

## 2011-08-24 MED ORDER — CARVEDILOL 6.25 MG PO TABS: 6.2500 mg | ORAL_TABLET | Freq: Two times a day (BID) | ORAL | Status: DC

## 2011-08-24 MED ORDER — LORAZEPAM 0.5 MG PO TABS: 0.5000 mg | ORAL_TABLET | Freq: Every day | ORAL | Status: DC | PRN

## 2011-08-24 NOTE — Telephone Encounter (Signed)
Discussed with pt. Refill done.  

## 2011-08-24 NOTE — Telephone Encounter (Signed)
Patient stopped into the office today and states she would like another kind of blood pressure medication. The metoprolol manufactured by [employer] is no longer sold at her drugstore and new manufacturer now contains magnesium. She needs another kind that does not contain magnesium. She has been to several drug stores in the area and everyone has switched to the manufacturer whose metoprolol contains magnesium.  Also needs needs lorazepam .5 mg take 1 tablet by mouth three times daily as needed. #90

## 2011-08-24 NOTE — Telephone Encounter (Signed)
Please advise 

## 2011-08-24 NOTE — Telephone Encounter (Signed)
I sent a new prescription for carvedilol. Will check her BP when she comes back later this month

## 2011-08-27 ENCOUNTER — Encounter: Payer: Medicare Other | Admitting: Internal Medicine

## 2011-09-10 ENCOUNTER — Ambulatory Visit (INDEPENDENT_AMBULATORY_CARE_PROVIDER_SITE_OTHER): Payer: Medicare Other | Admitting: Internal Medicine

## 2011-09-10 ENCOUNTER — Encounter: Payer: Self-pay | Admitting: Internal Medicine

## 2011-09-10 VITALS — BP 130/84 | HR 69 | Temp 98.1°F | Wt 175.0 lb

## 2011-09-10 DIAGNOSIS — E785 Hyperlipidemia, unspecified: Secondary | ICD-10-CM | POA: Diagnosis not present

## 2011-09-10 DIAGNOSIS — I10 Essential (primary) hypertension: Secondary | ICD-10-CM | POA: Diagnosis not present

## 2011-09-10 MED ORDER — METOPROLOL TARTRATE 50 MG PO TABS: 25.0000 mg | ORAL_TABLET | Freq: Two times a day (BID) | ORAL | Status: DC

## 2011-09-10 NOTE — Patient Instructions (Signed)
Go back: Metoprolol Check the  blood pressure 2 or 3 times a week, be sure it is less than 135/85. If it is consistently higher, let me know In 10 days, go back to Crestor  5 mg: take only  half tablet every day. Use Lasix only if you have swelling in your legs.

## 2011-09-10 NOTE — Progress Notes (Signed)
  Subjective:    Patient ID: [name], female    DOB: [birth year], 78 y.o.   MRN: [id number]  HPI Followup from previous visit Since her last office visit, at the patient requests,  I changed metoprolol to carvedilol. She took it temporarily and reports pain in the jaw. She self discontinued. Also she was prescribed Crestor a few months ago, started taking it a week ago and since then she thinks she has some pain and anterior legs bilaterally.  Past Medical History:  Headache - migraines, codeine prn  Hypertension  Hyperlipidemia  Osteoporosis  HEART, CV:  --Mild nonobstructive disease on cath in 2003.  --Probable Takotsubo cardiomyopathy: Severe chest pain with normal cath in 1994. Severe chest pain in 2003 with widespread T wave inversions on ECG. Cath with minimal coronary disease but LV-gram showed periapical severe hypokinesis and basilar hyperkinesia (EF 40%). Last echo in 4/09 confirmed full LV functional recovery with EF 60%, no regional wall motion abnormalities, mild to moderate LVH.  H/O CVA  ----------------------------------------------  GI:  GERD Hiatal hernia  Hemorrhoids  Hyperplastic colon polyp, 06/2001  Functional LLQ pain  h/o diverticuli  -----------------------------------------------  Obstructive sleep apnea  Varicose veins  h/o R leg DVT after a venous ablation  OA with bilateral knee pain  4-09 L2 fracture status post vertebroplasty of L2 September 10, 2007 performed by IR  Depression/anxiety -lost husband  Past Surgical History:  Appendectomy  left ovarian cyst  3/07 - stress test neg  Tonsillectomy  Back Surgery  Tubal Ligation  Dental extraction L maxillary molar  Kyphoplasty 08/2007  left total knee replacement 06/2010  Cholecystectomy, 03-2011    Review of Systems She was recently seen with respiratory symptoms, all that is resolved. Today she reports she has not been taking amlodipine in a while. Ambulatory BPs by rate from  1140/77-149/81. In general she feels well.     Objective:   Physical Exam General -- alert, well-developed. No apparent distress.  Lungs -- normal respiratory effort, no intercostal retractions, no accessory muscle use, and normal breath sounds.   Heart-- normal rate, regular rhythm, no murmur, and no gallop.     Extremities-- no pretibial edema bilaterally  Neurologic-- alert & oriented X3 and strength normal in all extremities. Psych-- Cognition and judgment appear intact. Alert and cooperative with normal attention span and concentration.  not anxious appearing and not depressed appearing.       Assessment & Plan:

## 2011-09-10 NOTE — Assessment & Plan Note (Addendum)
Was prescribed Crestor 5 mg daily based on the cholesterol panel from 06/2011, started to take it last week and since then has developed pain in the legs. Plan: Decrease Crestor 5 mg 2 half tablet daily. Reassess in 3 months

## 2011-09-10 NOTE — Assessment & Plan Note (Signed)
Per patient request (see phone note) we change from metoprolol to Coreg, she could not tolerate it  because jaw pain. In addition, she tells me today she has not taken amlodipine in a while. Medication list corrected. Plan: Go back to metoprolol, paper prescription provided,  she reports some difficulty finding this medicine.

## 2011-09-12 DIAGNOSIS — M545 Low back pain, unspecified: Secondary | ICD-10-CM | POA: Diagnosis not present

## 2011-09-24 ENCOUNTER — Other Ambulatory Visit: Payer: Self-pay | Admitting: Internal Medicine

## 2011-09-24 DIAGNOSIS — Z1231 Encounter for screening mammogram for malignant neoplasm of breast: Secondary | ICD-10-CM

## 2011-10-03 ENCOUNTER — Ambulatory Visit (INDEPENDENT_AMBULATORY_CARE_PROVIDER_SITE_OTHER): Payer: Medicare Other | Admitting: Internal Medicine

## 2011-10-03 VITALS — BP 124/76 | HR 79 | Temp 97.9°F | Wt 171.0 lb

## 2011-10-03 DIAGNOSIS — H0019 Chalazion unspecified eye, unspecified eyelid: Secondary | ICD-10-CM

## 2011-10-03 DIAGNOSIS — Z1289 Encounter for screening for malignant neoplasm of other sites: Secondary | ICD-10-CM

## 2011-10-03 DIAGNOSIS — R197 Diarrhea, unspecified: Secondary | ICD-10-CM

## 2011-10-03 DIAGNOSIS — D473 Essential (hemorrhagic) thrombocythemia: Secondary | ICD-10-CM

## 2011-10-03 DIAGNOSIS — D75839 Thrombocytosis, unspecified: Secondary | ICD-10-CM

## 2011-10-03 DIAGNOSIS — H0016 Chalazion left eye, unspecified eyelid: Secondary | ICD-10-CM

## 2011-10-03 MED ORDER — DICYCLOMINE HCL 10 MG PO CAPS: 10.0000 mg | ORAL_CAPSULE | Freq: Three times a day (TID) | ORAL | Status: DC

## 2011-10-03 MED ORDER — GENTAMICIN SULFATE 0.3 % OP SOLN: 2.0000 [drp] | Freq: Four times a day (QID) | OPHTHALMIC | Status: AC

## 2011-10-03 NOTE — Patient Instructions (Addendum)
Use the eye drops for 5 days, put warm compress over theeye, call if you get worse or not improving in 2-3 days. --------------------------------------------------- Please get a container from the lab and bring [place] stool samples C diff, WBCs, Hemoccult   Dx -- diarrhea -------------------------------------------------- For diarrhea and cramps: metamucil 2 capsules every day Align over-the-counter (probiotic)  one capsule every day for a month Bentyl 3 or 4 times a day as needed for cramps, watch for somnolence or side effects ----------------------------------------------------

## 2011-10-03 NOTE — Assessment & Plan Note (Signed)
6 months history of diarrhea, after gallbladder surgery. She has a history of 2 colonoscopies last one in 2010, she also has a history of functional left lower quadrant abdominal pain. Plan: CBC, TSH, stool studies for C. difficile and WBCs. Trial with align and bentil

## 2011-10-03 NOTE — Progress Notes (Signed)
  Subjective:    Patient ID: [name], female    DOB: Nov 04, 1929, 78 y.o.   MRN: [id number]  HPI Acute visit Complains of diarrhea since her gallbladder surgery 03-2011. Stools are soft, usually 4-6 BMs daily, color is  brown, amount of stool is not necessarily large. She also has several abdominal cramps a day since the surgery, mostly in the mid and lower abdomen, cramps not changed by  bowel movements.  Also, yesterday woke up with pain and redness at the left eye. Vision is normal. She had an eye checkup 05-2011, no glaucoma.    Past Medical History:   Headache - migraines, codeine prn   Hypertension   Hyperlipidemia   Osteoporosis   HEART, CV:  --Mild nonobstructive disease on cath in 2003.   --Probable Takotsubo cardiomyopathy: Severe chest pain with normal cath in 1994. Severe chest pain in 2003 with widespread T wave inversions on ECG. Cath with minimal coronary disease but LV-gram showed periapical severe hypokinesis and basilar hyperkinesia (EF 40%). Last echo in 4/09 confirmed full LV functional recovery with EF 60%, no regional wall motion abnormalities, mild to moderate LVH.   H/O CVA   ----------------------------------------------   GI:  GERD Hiatal hernia   Hemorrhoids   Hyperplastic colon polyp, 06/2001   Functional LLQ pain   h/o diverticuli   -----------------------------------------------   Obstructive sleep apnea   Varicose veins   h/o R leg DVT after a venous ablation   OA with bilateral knee pain   4-09 L2 fracture status post vertebroplasty of L2 September 10, 2007 performed by IR   Depression/anxiety -lost husband    Past Surgical History:   Appendectomy   left ovarian cyst   3/07 - stress test neg   Tonsillectomy   Back Surgery   Tubal Ligation   Dental extraction L maxillary molar   Kyphoplasty 08/2007   left total knee replacement 06/2010   Cholecystectomy, 03-2011    Review of Systems No fever chills No nausea vomiting No blood in the  stools or weight loss. No dysuria gross hematuria.     Objective:   Physical Exam  Eyes:     General -- alert, well-developed, and overweight appearing. No apparent distress.  Lungs -- normal respiratory effort, no intercostal retractions, no accessory muscle use, and normal breath sounds.   Heart-- normal rate, regular rhythm, no murmur, and no gallop.   Abdomen--soft, nondistended, mild discomfort to palpation throughout, slight increase bowel sounds Extremities-- no pretibial edema bilaterally  Neurologic-- alert & oriented X3  Psych-- Cognition and judgment appear intact. Alert and cooperative with normal attention span and concentration.  not anxious appearing and not depressed appearing.       Assessment & Plan:

## 2011-10-03 NOTE — Assessment & Plan Note (Signed)
Findings most likely from chalazion, see instructions

## 2011-10-04 ENCOUNTER — Encounter: Payer: Self-pay | Admitting: Internal Medicine

## 2011-10-04 LAB — CBC WITH DIFFERENTIAL/PLATELET
Basophils Absolute: 0.1 10*3/uL (ref 0.0–0.1)
Basophils Relative: 0.6 % (ref 0.0–3.0)
Eosinophils Absolute: 0.3 10*3/uL (ref 0.0–0.7)
Eosinophils Relative: 3.9 % (ref 0.0–5.0)
HCT: 51 % — ABNORMAL HIGH (ref 36.0–46.0)
Hemoglobin: 16 g/dL — ABNORMAL HIGH (ref 12.0–15.0)
Lymphocytes Relative: 24.3 % (ref 12.0–46.0)
Lymphs Abs: 2 10*3/uL (ref 0.7–4.0)
MCHC: 31.4 g/dL (ref 30.0–36.0)
MCV: 77.9 fl — ABNORMAL LOW (ref 78.0–100.0)
Monocytes Absolute: 0.9 10*3/uL (ref 0.1–1.0)
Monocytes Relative: 11.1 % (ref 3.0–12.0)
Neutro Abs: 5.1 10*3/uL (ref 1.4–7.7)
Neutrophils Relative %: 60.1 % (ref 43.0–77.0)
Platelets: 608 10*3/uL — ABNORMAL HIGH (ref 150.0–400.0)
RBC: 6.55 Mil/uL — ABNORMAL HIGH (ref 3.87–5.11)
RDW: 17.4 % — ABNORMAL HIGH (ref 11.5–14.6)
WBC: 8.4 10*3/uL (ref 4.5–10.5)

## 2011-10-04 LAB — TSH: TSH: 2.11 u[IU]/mL (ref 0.35–5.50)

## 2011-10-05 DIAGNOSIS — R197 Diarrhea, unspecified: Secondary | ICD-10-CM | POA: Diagnosis not present

## 2011-10-05 NOTE — Progress Notes (Signed)
Addended by: Edwena Felty T on: 10/05/2011 10:33 AM   Modules accepted: Orders

## 2011-10-05 NOTE — Progress Notes (Signed)
Addended by: Silvio Pate D on: 10/05/2011 08:42 AM   Modules accepted: Orders

## 2011-10-05 NOTE — Progress Notes (Signed)
Addended by: Silvio Pate D on: 10/05/2011 08:55 AM   Modules accepted: Orders

## 2011-10-08 ENCOUNTER — Other Ambulatory Visit: Payer: Self-pay | Admitting: Internal Medicine

## 2011-10-09 LAB — STOOL CULTURE

## 2011-10-09 NOTE — Telephone Encounter (Signed)
Ok to refill? #90 with zero refills. Last filled on 4.12.13.

## 2011-10-10 LAB — CLOSTRIDIUM DIFFICILE EIA: CDIFTX: NEGATIVE

## 2011-10-10 NOTE — Telephone Encounter (Signed)
Per dr. Paz refill done.  

## 2011-10-12 ENCOUNTER — Telehealth: Payer: Self-pay | Admitting: Hematology & Oncology

## 2011-10-12 LAB — POC HEMOCCULT BLD/STL (OFFICE/1-CARD/DIAGNOSTIC): Fecal Occult Blood, POC: NEGATIVE

## 2011-10-12 NOTE — Progress Notes (Signed)
Addended by: Silvio Pate D on: 10/12/2011 01:39 PM   Modules accepted: Orders

## 2011-10-12 NOTE — Telephone Encounter (Signed)
LEFT MESSAGE FOR PATIENT TO CALL AND SCHEDULE APPOINTMENT.

## 2011-10-16 ENCOUNTER — Telehealth: Payer: Self-pay | Admitting: Hematology & Oncology

## 2011-10-16 NOTE — Telephone Encounter (Signed)
Pt aware of 10-29-11 appointment

## 2011-10-18 ENCOUNTER — Ambulatory Visit (HOSPITAL_COMMUNITY)
Admission: RE | Admit: 2011-10-18 | Discharge: 2011-10-18 | Disposition: A | Discharge status: Home / Self Care | Payer: Medicare Other | Source: Ambulatory Visit | Attending: Internal Medicine | Admitting: Internal Medicine

## 2011-10-18 DIAGNOSIS — Z1231 Encounter for screening mammogram for malignant neoplasm of breast: Secondary | ICD-10-CM | POA: Insufficient documentation

## 2011-10-19 ENCOUNTER — Emergency Department (HOSPITAL_COMMUNITY)
Admission: EM | Admit: 2011-10-19 | Discharge: 2011-10-19 | Disposition: A | Discharge status: Home / Self Care | Payer: Medicare Other | Attending: Emergency Medicine | Admitting: Emergency Medicine

## 2011-10-19 ENCOUNTER — Encounter (HOSPITAL_COMMUNITY): Payer: Self-pay | Admitting: *Deleted

## 2011-10-19 ENCOUNTER — Emergency Department (HOSPITAL_COMMUNITY): Payer: Medicare Other

## 2011-10-19 DIAGNOSIS — Z79899 Other long term (current) drug therapy: Secondary | ICD-10-CM | POA: Insufficient documentation

## 2011-10-19 DIAGNOSIS — Z8673 Personal history of transient ischemic attack (TIA), and cerebral infarction without residual deficits: Secondary | ICD-10-CM | POA: Diagnosis not present

## 2011-10-19 DIAGNOSIS — E785 Hyperlipidemia, unspecified: Secondary | ICD-10-CM | POA: Insufficient documentation

## 2011-10-19 DIAGNOSIS — R5381 Other malaise: Secondary | ICD-10-CM | POA: Diagnosis not present

## 2011-10-19 DIAGNOSIS — Z86718 Personal history of other venous thrombosis and embolism: Secondary | ICD-10-CM | POA: Diagnosis not present

## 2011-10-19 DIAGNOSIS — I10 Essential (primary) hypertension: Secondary | ICD-10-CM | POA: Diagnosis not present

## 2011-10-19 DIAGNOSIS — I2510 Atherosclerotic heart disease of native coronary artery without angina pectoris: Secondary | ICD-10-CM | POA: Diagnosis not present

## 2011-10-19 DIAGNOSIS — G459 Transient cerebral ischemic attack, unspecified: Secondary | ICD-10-CM

## 2011-10-19 DIAGNOSIS — R5383 Other fatigue: Secondary | ICD-10-CM | POA: Diagnosis not present

## 2011-10-19 DIAGNOSIS — M810 Age-related osteoporosis without current pathological fracture: Secondary | ICD-10-CM | POA: Insufficient documentation

## 2011-10-19 DIAGNOSIS — I6350 Cerebral infarction due to unspecified occlusion or stenosis of unspecified cerebral artery: Secondary | ICD-10-CM | POA: Diagnosis not present

## 2011-10-19 DIAGNOSIS — G319 Degenerative disease of nervous system, unspecified: Secondary | ICD-10-CM | POA: Diagnosis not present

## 2011-10-19 DIAGNOSIS — R29898 Other symptoms and signs involving the musculoskeletal system: Secondary | ICD-10-CM | POA: Diagnosis not present

## 2011-10-19 LAB — COMPREHENSIVE METABOLIC PANEL
ALT: 14 U/L (ref 0–35)
AST: 21 U/L (ref 0–37)
Albumin: 3.8 g/dL (ref 3.5–5.2)
Alkaline Phosphatase: 100 U/L (ref 39–117)
BUN: 9 mg/dL (ref 6–23)
CO2: 24 mEq/L (ref 19–32)
Calcium: 9.5 mg/dL (ref 8.4–10.5)
Chloride: 100 mEq/L (ref 96–112)
Creatinine, Ser: 0.73 mg/dL (ref 0.50–1.10)
GFR calc Af Amer: >90 mL/min (ref >90)
GFR calc non Af Amer: 79 mL/min — ABNORMAL LOW (ref >90)
Glucose, Bld: 102 mg/dL — ABNORMAL HIGH (ref 70–99)
Potassium: 4.3 mEq/L (ref 3.5–5.1)
Sodium: 136 mEq/L (ref 135–145)
Total Bilirubin: 1.2 mg/dL (ref 0.3–1.2)
Total Protein: 6.9 g/dL (ref 6.0–8.3)

## 2011-10-19 LAB — DIFFERENTIAL
Basophils Absolute: 0.1 10*3/uL (ref 0.0–0.1)
Basophils Relative: 1 % (ref 0–1)
Eosinophils Absolute: 0.2 10*3/uL (ref 0.0–0.7)
Eosinophils Relative: 3 % (ref 0–5)
Lymphocytes Relative: 16 % (ref 12–46)
Lymphs Abs: 1.3 10*3/uL (ref 0.7–4.0)
Monocytes Absolute: 0.8 10*3/uL (ref 0.1–1.0)
Monocytes Relative: 10 % (ref 3–12)
Neutro Abs: 5.8 10*3/uL (ref 1.7–7.7)
Neutrophils Relative %: 71 % (ref 43–77)

## 2011-10-19 LAB — PROTIME-INR
INR: 1.05 (ref 0.00–1.49)
Prothrombin Time: 13.9 seconds (ref 11.6–15.2)

## 2011-10-19 LAB — CBC
HCT: 50.4 % — ABNORMAL HIGH (ref 36.0–46.0)
Hemoglobin: 16.3 g/dL — ABNORMAL HIGH (ref 12.0–15.0)
MCH: 25.3 pg — ABNORMAL LOW (ref 26.0–34.0)
MCHC: 32.3 g/dL (ref 30.0–36.0)
MCV: 78.3 fL (ref 78.0–100.0)
Platelets: 541 10*3/uL — ABNORMAL HIGH (ref 150–400)
RBC: 6.44 MIL/uL — ABNORMAL HIGH (ref 3.87–5.11)
RDW: 15.7 % — ABNORMAL HIGH (ref 11.5–15.5)
WBC: 8.1 10*3/uL (ref 4.0–10.5)

## 2011-10-19 LAB — TROPONIN I: Troponin I: <0.3 ng/mL (ref <0.30)

## 2011-10-19 LAB — APTT: aPTT: 41 seconds — ABNORMAL HIGH (ref 24–37)

## 2011-10-19 MED ORDER — HYDROMORPHONE HCL PF 2 MG/ML IJ SOLN: 2.0000 mg | INTRAMUSCULAR | Status: DC | PRN

## 2011-10-19 MED ORDER — SODIUM CHLORIDE 0.9 % IV SOLN: Freq: Once | INTRAVENOUS | Status: DC

## 2011-10-19 NOTE — ED Provider Notes (Signed)
Medical screening examination/treatment/procedure(s) were conducted as a shared visit with non-physician practitioner(s) and myself.  I personally evaluated the patient during the encounter   Transient Right arm weakness with blurred vision. Pt with hx CVA. She had normal Carotid Doppler and Cardiac ECHO in 2012. Neuro exam: A&O X 3, no dysathria or weakness. Normal sensation.  Dx: TIA. Pt on Plavix in past, does not tolerate ASA now. I do not think she needs further eval. For TIA at this time. Plavix is likely the best measure for her. I tried to contact Dr Drue Novel, but Dr Milinda Antis responded and she did not know the pt. Will ask NPP to contact Neurology for recommendation. Pt appears stable for d/c.  Flint Melter, MD 10/19/11 2110

## 2011-10-19 NOTE — Discharge Instructions (Signed)
PLEASE return to ED for any concerning symptom or deterioration in symptoms. Resume taking daily aspirin. Follow with Dr. Drue Novel on MondayTransient Ischemic Attack A transient ischemic attack (TIA) is a "warning stroke" that causes stroke-like symptoms. Unlike a stroke, a TIA does not cause permanent damage to the brain. The symptoms of a TIA can happen very fast and do not last long. It is important to know the symptoms of a TIA and what to do. This can help prevent a major stroke or death. CAUSES   A TIA is caused by a temporary blockage in an artery in the brain or neck (carotid artery). The blockage does not allow the brain to get the blood supply it needs and can cause different symptoms. The blockage can be caused by either:   A blood clot.   Fatty buildup (plaque) in a neck or brain artery.  SYMPTOMS  TIA symptoms are the same as a stroke but are temporary. Symptoms can include sudden:  Numbness or weakness on one side of the body. Especially to the:   Face.   Arm.   Leg.   Trouble speaking, thinking, or confusion.   Change in vision, such as trouble seeing in one or both eyes.   Dizziness, loss of balance, or difficulty walking.   Severe headache.  ANY OF THESE SYMPTOMS MAY REPRESENT A SERIOUS PROBLEM THAT IS AN EMERGENCY. Do not wait to see if the symptoms will go away. Get medical help at once. Call your local emergency services (911 in [place] IMMEDIATELY. DO NOT drive yourself to the hospital. RISK FACTORS Risk factors can increase the risk of developing a TIA. These can include.   High blood pressure (hypertension).   High cholesterol (hyperlipidemia).   Heart disease (atherosclerosis).   Smoking.   Diabetes.   Abnormal heart rhythm (atrial fibrillation).   Family history of a stroke or heart attack.   Use of oral contraceptives (especially when combined with smoking).  DIAGNOSIS   A TIA can be diagnosed based on your:   Symptoms.   History.   Risk  factors.   Tests that can help diagnose the symptoms of a TIA include:   CT or MRI scan. These tests can provide detailed images of the brain.   Carotid ultrasound. This test looks to see if there are blockages in the carotid arteries of your neck.   Arteriography. A thin, small flexible tube (catheter) is inserted through a small cut (incision) in your groin. The catheter is threaded to your carotid or vertebral artery. A dye is then injected into the catheter. The dye highlights the arteries in your brain and allows your caregiver to look for narrowing or blockages that can cause a TIA.  TREATMENT  Based on the cause of a TIA, treatment options can vary. Treatment is important to help prevent a stroke. Treatment options can include:  Medication. Such as:   Clot-busting medicine.   Anti-platelet medicine.   Blood pressure medicine.   Blood thinner medicine.   Surgery:   Carotid endarterectomy. The carotid arteries are the arteries that supply the head and neck with oxygenated blood. This surgery can help remove fatty deposits (plaque) in the carotid arteries.   Angioplasty and stenting. This surgery uses a balloon to dilate a blocked artery in the brain. A stent is a small, metal mesh tube that can help keep an artery open  HOME CARE INSTRUCTIONS   It is important to take all medicine as told by your caregiver. If  the medicine has side effects that affect you negatively, tell your caregiver right away. Do not stop taking medicine unless told by your caregiver. Some medicines may need to be changed to better treat your condition.   Do not smoke. Talk to your caregiver on how to quit smoking.   Eat a diet high in fruits, vegetables and lean meat. Avoid a high fat, high salt diet. A dietician can you help you make healthy food choices.   Maintain a healthy weight. Develop an exercise plan approved by your caregiver.  SEEK IMMEDIATE MEDICAL CARE IF:   You develop weakness or  numbness on one side of your body.   You have problems thinking, speaking, or feel confused.   You have vision changes.   You feel dizzy, have trouble walking, or lose your balance.   You develop a severe headache.  MAKE SURE YOU:   Understand these instructions.   Will watch your condition.   Will get help right away if you are not doing well or get worse.  Document Released: 02/07/2005 Document Revised: 04/19/2011 Document Reviewed: 06/23/2009 [facility] Patient Information 2012 [place], [place].

## 2011-10-19 NOTE — ED Notes (Signed)
Pt states she is seeing stars and Z's, blurred vision again, denies numbness

## 2011-10-19 NOTE — ED Notes (Signed)
Per pt, about an hour ago pt experienced blurred vision, then about 10 minutes after the blurred vision happen she developed numbness in her R arm from hand to elbow, denies tingling feeling. Pt denies any numbness or tingling in face or legs. Pt states numbness in R arm lasted about 10 minutes, states numbness and vision changes have resolved.

## 2011-10-19 NOTE — ED Provider Notes (Addendum)
History     CSN: [id number]  Arrival date & time 10/19/11  1159   First MD Initiated Contact with Patient 10/19/11 1235      No chief complaint on file.   (Consider location/radiation/quality/duration/timing/severity/associated sxs/prior treatment) Patient is a 78 y.o. female presenting with extremity weakness. The history is provided by the patient.  Extremity Weakness This is a new problem. The current episode started today. Associated symptoms include headaches and weakness. Pertinent negatives include no abdominal pain, chest pain, coughing, nausea or numbness.   78 y/o female INAD c/o right arm weakness describes as "floppiness" onset around 10:40 AM today lasting about 15 minutes now resolved. Pt Also had blurred vision starting 10 minutes before the onset of arm weakness, now fully resolved as well. Blurred vision is typical prodrome to PT's migraines. Weakness is  Atypical for her migraines. Pt also reports mild (2/10) frontal headache, which is an atypical location for her migraines which are usually left sided. Denies chest pain or palpitations. PT sates that she was taking a baby aspirin daily and self d/c'd them 3 days ago.  Pt states that she has had MRI's in the past and was told that she "had 8 mini strokes"  Past Medical History  Diagnosis Date  . Headache     migraines  . Hyperlipidemia   . Osteoporosis   . CVA (cerebral infarction)   . Hemorrhoids   . Hyperplastic colon polyp 06/2001  . Diverticulitis   . Varicose vein   . DVT (deep venous thrombosis)     after venous ablation, R leg  . Fracture 09/10/07    L2, status post vertebroplasty of L2 performed by IR  . Cardiomyopathy     Probable Takotsubo, severe CP w/ normal cath in 1994. Severe CP in 2003 w/ widespread T wave inversions on ECG. Cath w/ minimal coronary disease but LV-gram showed periapical severe hypokinesis and basilar hyperkinesia (EF 40%). Last echo in 4/09 confirmed full LV functional recovery with  EF 60%, no regional wall motion abnormalities, mild to moderate LVH.  . OSA (obstructive sleep apnea) 03-29-11    no cpap used, not a problem now.  Marland Kitchen CAD (coronary artery disease)     mild nonobstructive disease on cath in 2003  . Hypertension   . GERD (gastroesophageal reflux disease)   . Hiatal hernia 03-29-11    no nerve problems  . Depression with anxiety 03-29-11    lost husband 3'09  . OA (osteoarthritis) of knee 03-29-11    w/ bilateral knee pain-not a problem now  . Stroke 03-29-11    CVA x2 -last 10'12-?TIA(visual problems)  . E. coli gastroenteritis 03-29-11    8'10    Past Surgical History  Procedure Date  . Appendectomy   . Ovarian cyst surgery     left  . Tonsillectomy   . Back surgery   . Tubal ligation   . Dental extraction     L maxillary molar  . Kyphosis surgery 08/2007    cement used  . Total knee arthroplasty 06/2010    left  . Cholecystectomy 04/09/2011    Procedure: LAPAROSCOPIC CHOLECYSTECTOMY WITH INTRAOPERATIVE CHOLANGIOGRAM;  Surgeon: Adolph Pollack, MD;  Location: [facility];  Service: General;  Laterality: N/A;    Family History  Problem Relation Age of Onset  . Colon polyps      cousin  . Colon cancer      cousin  . Breast cancer Mother   . Breast cancer Sister   .  Heart disease Father     MIs  . Leukemia Brother     GM    History  Substance Use Topics  . Smoking status: Former Smoker    Quit date: 05/14/1988  . Smokeless tobacco: Never Used  . Alcohol Use: 0.6 oz/week    1 Cans of beer per week    OB History    Grav Para Term Preterm Abortions TAB SAB Ect Mult Living                  Review of Systems  Respiratory: Negative for apnea, cough, chest tightness and shortness of breath.   Cardiovascular: Negative for chest pain, palpitations and leg swelling.  Gastrointestinal: Negative for nausea, abdominal pain, diarrhea and constipation.  Musculoskeletal: Positive for extremity weakness.  Neurological: Positive for  weakness and headaches. Negative for dizziness, tremors, seizures, syncope, facial asymmetry, speech difficulty, light-headedness and numbness.    Allergies  Atorvastatin; Ezetimibe; Fluvastatin sodium; Magnesium hydroxide; Penicillins; Quinapril hcl; Rosuvastatin; Simvastatin; Topamax; and Valsartan  Home Medications   Current Outpatient Rx  Name Route Sig Dispense Refill  . ACETAMINOPHEN-CODEINE #3 300-30 MG PO TABS  TAKE 1 TABLET BY MOUTH EVERY 8 HOURS AS NEEDED FOR MIGRAINE 90 tablet 0  . AMLODIPINE BESYLATE 5 MG PO TABS Oral Take 5 mg by mouth daily.    . ASPIRIN 81 MG PO TABS Oral Take 160 mg by mouth daily.    . AZELASTINE HCL 137 MCG/SPRAY NA SOLN Nasal Place 1 spray into the nose 2 (two) times daily. Use in each nostril as directed 30 mL 1  . TUMS ULTRA 1000 PO Oral Take 2 tablets by mouth daily.     Marland Kitchen VITAMIN D 1000 UNITS PO TABS Oral Take 1,000 Units by mouth daily.      Marland Kitchen COENZYME Q10 200 MG PO CAPS Oral Take 1 capsule (200 mg total) by mouth daily.    Marland Kitchen DICYCLOMINE HCL 10 MG PO CAPS Oral Take 1 capsule (10 mg total) by mouth 4 (four) times daily -  before meals and at bedtime. 40 capsule 0  . DOCUSATE SODIUM 100 MG PO CAPS Oral Take 100 mg by mouth daily as needed. For constipation    . FUROSEMIDE 20 MG PO TABS Oral Take 20 mg by mouth daily as needed. For fluid retention    . LORAZEPAM 0.5 MG PO TABS  TAKE 1 TABLET BY MOUTH EVERY DAY AS NEEDED FOR ANXIETY 90 tablet 0  . LUTEIN PO Oral Take 1 tablet by mouth daily.      Marland Kitchen METOPROLOL TARTRATE 50 MG PO TABS Oral Take 0.5 tablets (25 mg total) by mouth 2 (two) times daily. 30 tablet 6  . OMEPRAZOLE 40 MG PO CPDR Oral Take 40 mg by mouth daily.      Marland Kitchen ROSUVASTATIN CALCIUM 5 MG PO TABS Oral Take 2.5 mg by mouth daily. Take one every other day    . VITAMIN E 400 UNITS PO CAPS Oral Take 400 Units by mouth daily.       BP 119/63  Pulse 61  Temp(Src) 97.8 F (36.6 C) (Oral)  Resp 13  SpO2 97%  Physical Exam  Constitutional:  She is oriented to person, place, and time. She appears well-developed and well-nourished.  HENT:  Head: Normocephalic and atraumatic.  Eyes: Conjunctivae and EOM are normal. Pupils are equal, round, and reactive to light. No scleral icterus.  Neck: Normal range of motion.  Cardiovascular: Normal rate, regular rhythm and normal  heart sounds.   Pulmonary/Chest: Effort normal and breath sounds normal.  Abdominal: Soft. Bowel sounds are normal.  Musculoskeletal: Normal range of motion.  Neurological: She is alert and oriented to person, place, and time. She has normal strength and normal reflexes. She displays normal reflexes. No cranial nerve deficit or sensory deficit. She displays a negative Romberg sign. Coordination and gait normal. GCS eye subscore is 4. GCS verbal subscore is 5. GCS motor subscore is 6.       Strength of upper extremities equal bilaterally   Strength of Left lower extremity 4/5 Pt has TKR on that side. RLE 5/5  Otherwise CN, Romberg and Pronator drift unremarkable    ED Course  Procedures (including critical care time)  Labs Reviewed  APTT - Abnormal; Notable for the following:    aPTT 41 (*)    All other components within normal limits  CBC - Abnormal; Notable for the following:    RBC 6.44 (*)    Hemoglobin 16.3 (*)    HCT 50.4 (*)    MCH 25.3 (*)    RDW 15.7 (*)    Platelets 541 (*)    All other components within normal limits  COMPREHENSIVE METABOLIC PANEL - Abnormal; Notable for the following:    Glucose, Bld 102 (*)    GFR calc non Af Amer 79 (*)    All other components within normal limits  PROTIME-INR  DIFFERENTIAL  TROPONIN I   Ct Head Wo Contrast  10/19/2011  *RADIOLOGY REPORT*  Clinical Data: Weakness  CT HEAD WITHOUT CONTRAST  Technique:  Contiguous axial images were obtained from the base of the skull through the vertex without contrast.  Comparison: 07/05/2006  Findings: No skull fracture is noted.  Paranasal sinuses and mastoid air cells are  unremarkable.  No intracranial hemorrhage, mass effect or midline shift.  Stable mild cerebral atrophy. Stable small lacunar infarct in the right thalamus.  Mild periventricular and patchy subcortical white matter decreased attenuation probable due to chronic small vessel ischemic changes.  No definite acute cortical infarction.  Stable prominent size right vertebral artery with atherosclerotic calcifications.  No mass lesion is noted on this unenhanced scan.  IMPRESSION: No acute intracranial abnormality.  No definite acute cortical infarction.  Stable small lacunar infarct right thalamus.  Mild cerebral atrophy.  Mild periventricular and patchy subcortical white matter decreased attenuation probable due to chronic small vessel ischemic changes.  Original Report Authenticated By: Natasha Mead, M.D.    Date: 01/23/2012  Rate: 61  Rhythm: normal sinus rhythm  QRS Axis: normal  Intervals: PR prolonged  ST/T Wave abnormalities: normal  Conduction Disutrbances:none  Narrative Interpretation:   Old EKG Reviewed: none available   1. Transient ischemic attack (TIA)       MDM  r/o stroke/TIA    MRI report from [facility] (DOS 05/03/2010)  Read as old right thalamic infarct and moderate atheromatous changes.   CT showed no acute changes.   ABCD2 Score shows moderate risk at 4 points based on Age, h/o focal weakness, and duration of symptoms  Call to PCP Dr. Drue Novel was not returned. Neuro consult requested for recommendation of inpatient treatment vs d/c with plavix or ASA.   Pt and daughter would like to be d/c to f/u as an outpatient. Pt advised orf risk of death and disability and still wishes to [facility]. Advised PT to return for any worsening of symptoms      [name] 10/19/11 1832  Dawn Emery, PA-C  01/23/12 0726 

## 2011-10-24 ENCOUNTER — Other Ambulatory Visit: Payer: Self-pay | Admitting: Internal Medicine

## 2011-10-24 DIAGNOSIS — R928 Other abnormal and inconclusive findings on diagnostic imaging of breast: Secondary | ICD-10-CM

## 2011-10-29 ENCOUNTER — Other Ambulatory Visit (HOSPITAL_BASED_OUTPATIENT_CLINIC_OR_DEPARTMENT_OTHER): Payer: Medicare Other | Admitting: Lab

## 2011-10-29 ENCOUNTER — Ambulatory Visit (HOSPITAL_BASED_OUTPATIENT_CLINIC_OR_DEPARTMENT_OTHER): Payer: Medicare Other | Admitting: Hematology & Oncology

## 2011-10-29 ENCOUNTER — Other Ambulatory Visit: Payer: Self-pay | Admitting: *Deleted

## 2011-10-29 ENCOUNTER — Ambulatory Visit: Payer: Medicare Other

## 2011-10-29 VITALS — BP 160/81 | HR 92 | Temp 97.2°F | Ht 67.0 in | Wt 177.0 lb

## 2011-10-29 DIAGNOSIS — D75839 Thrombocytosis, unspecified: Secondary | ICD-10-CM

## 2011-10-29 DIAGNOSIS — I10 Essential (primary) hypertension: Secondary | ICD-10-CM | POA: Diagnosis not present

## 2011-10-29 DIAGNOSIS — D6800 Von Willebrand disease, unspecified: Secondary | ICD-10-CM

## 2011-10-29 DIAGNOSIS — D518 Other vitamin B12 deficiency anemias: Secondary | ICD-10-CM | POA: Diagnosis not present

## 2011-10-29 DIAGNOSIS — Z806 Family history of leukemia: Secondary | ICD-10-CM

## 2011-10-29 DIAGNOSIS — Z803 Family history of malignant neoplasm of breast: Secondary | ICD-10-CM | POA: Diagnosis not present

## 2011-10-29 DIAGNOSIS — D509 Iron deficiency anemia, unspecified: Secondary | ICD-10-CM

## 2011-10-29 DIAGNOSIS — D473 Essential (hemorrhagic) thrombocythemia: Secondary | ICD-10-CM

## 2011-10-29 DIAGNOSIS — D510 Vitamin B12 deficiency anemia due to intrinsic factor deficiency: Secondary | ICD-10-CM

## 2011-10-29 DIAGNOSIS — D751 Secondary polycythemia: Secondary | ICD-10-CM

## 2011-10-29 LAB — CHCC SATELLITE - SMEAR

## 2011-10-29 LAB — CBC WITH DIFFERENTIAL (CANCER CENTER ONLY)
BASO#: 0.1 10*3/uL (ref 0.0–0.2)
BASO%: 0.7 % (ref 0.0–2.0)
EOS%: 2.8 % (ref 0.0–7.0)
Eosinophils Absolute: 0.2 10*3/uL (ref 0.0–0.5)
HCT: 51.5 % — ABNORMAL HIGH (ref 34.8–46.6)
HGB: 16.4 g/dL — ABNORMAL HIGH (ref 11.6–15.9)
LYMPH#: 1.5 10*3/uL (ref 0.9–3.3)
LYMPH%: 17.4 % (ref 14.0–48.0)
MCH: 24.6 pg — ABNORMAL LOW (ref 26.0–34.0)
MCHC: 31.8 g/dL — ABNORMAL LOW (ref 32.0–36.0)
MCV: 77 fL — ABNORMAL LOW (ref 81–101)
MONO#: 0.7 10*3/uL (ref 0.1–0.9)
MONO%: 8.6 % (ref 0.0–13.0)
NEUT#: 6 10*3/uL (ref 1.5–6.5)
NEUT%: 70.5 % (ref 39.6–80.0)
Platelets: 607 10*3/uL — ABNORMAL HIGH (ref 145–400)
RBC: 6.66 10*6/uL — ABNORMAL HIGH (ref 3.70–5.32)
RDW: 18.2 % — ABNORMAL HIGH (ref 11.1–15.7)
WBC: 8.5 10*3/uL (ref 3.9–10.0)

## 2011-10-29 LAB — LACTATE DEHYDROGENASE: LDH: 163 U/L (ref 94–250)

## 2011-10-29 LAB — VITAMIN B12: Vitamin B-12: 359 pg/mL (ref 211–911)

## 2011-10-29 LAB — IRON AND TIBC
%SAT: 7 % — ABNORMAL LOW (ref 20–55)
Iron: 31 ug/dL — ABNORMAL LOW (ref 42–145)
TIBC: 457 ug/dL (ref 250–470)
UIBC: 426 ug/dL — ABNORMAL HIGH (ref 125–400)

## 2011-10-29 NOTE — Progress Notes (Signed)
Pt to have BMBX on 11/06/11 per Dr Myna Hidalgo. Will be done at [facility] Short Stay.

## 2011-10-29 NOTE — Progress Notes (Signed)
This office note has been dictated.

## 2011-10-29 NOTE — Progress Notes (Signed)
CC:   Willow Ora, MD  DIAGNOSES: 1. Thrombocytosis. 2. Leukocytosis.  HISTORY OF PRESENT ILLNESS:  Ms. Tuzzolino is a very nice 78 year old white female.  She is followed by Dr. Willow Ora.  She has been fairly healthy.  She has a very striking family history of malignancy.  Mother and sister have had breast cancer.  She has had a cyst removed from the left breast in the past.  There is a history of myeloma.  Appears to have a history of acute leukemia.  She has not had any change in her medications.  She does have some hypertension.  She has some hyperlipidemia.  She has had her gallbladder taken out.  This was taken out last year by Dr. Abbey Chatters.  She has been noted to have some thrombocytosis.  Going back to June 2012, her platelet count was 510,000.  White cell count was 7.3 and hemoglobin 14.9.  In November 2012, her platelet count was 593, hemoglobin was 16, and her white cell count was 8.4.  She had a relatively normal white cell differential.  She had a normal thyroid function.  In May 2013, a CBC was done which showed a white count 8.4, hemoglobin 16, and platelet count of 608.  Dr. Drue Novel felt that she needed to be seen by Hematology because of the persistent thrombocytosis.  She denies any kind of burning in the hands or feet.  She says she has had some "mini strokes" in the past.  She did have an episode of syncope back in 2009 and underwent a vertebroplasty of L2.  She has had some abdominal cramps.  This does appear to be after her gallbladder surgery.  She had a little diarrhea.  She had a cardiac cath back in 2003.  She was found have some hypokinesis and had an EF of 40%.  In 2009, LVEF was 60%.  She has had no pruritus.  She does not state any type of peptic ulcer issues.  There is no dyspepsia.  She has had no double vision or blurred vision.  There is no dysphasia of odynophagia.  She denies any headache. She has had no leg swelling.  She has had no rash.   There has been no ecchymoses.  As such, Dr. Drue Novel felt that a hematologic evaluation was indicated and kindly referred Ms. Protzman to the [employer] for recommendations.  She had a recent mammogram.  This showed a "possible mass" in the left breast.  This is going to be evaluated further.  She has had a recent CT of the brain.  This was back 10/19/2011.  This showed no evidence of intracranial abnormality.  Stable small lacunar infarct in the right thalamus.  Mild cerebral atrophy.  Back when she had her gallbladder out, an ultrasound done of the abdomen.  This did not show any splenomegaly.  ALLERGIES: 1. Statins.  2.  Penicillin.  3.  Valsartan.  4.  Topamax.  5.  ACE     inhibitors.  MEDICATIONS: 1. Tylenol No.3 one p.o. q.8 hours p.r.n. 2. Norvasc 5 mg p.o. daily. 3. Aspirin 81 mg p.o. daily. 4. Dulcolax 100 mg p.o. q. day p.r.n. 5. Lasix 20 mg p.o. q. day. 6. Ativan 0.5 q. day p.r.n. 7. Lopressor 25 mg p.o. b.i.d. 8. Prilosec 40 mg p.o. q. day. 9. Crestor 2.5 mg p.o. q. day. 10.Vitamin E 200 units daily.  SOCIAL HISTORY:  Remarkable for remote tobacco use.  She has not smoked for 25  years.  She has no alcohol use.  There is no obvious occupational exposure.  FAMILY HISTORY:  Remarkable for breast cancer in mother in her mother and sister.  She has not been tested for the BRCA gene.  She says she does not want to be tested for this.  She does have 3 daughters.  There is a history of acute myeloid leukemia.  There is a history of myeloma.  REVIEW OF SYSTEMS:  As stated in history of present illness.  No additional findings were noted on a 12-system review.  PHYSICAL EXAMINATION:  General:  This is a fairly well-developed, well- nourished, white female in no obvious distress.  Vital signs: Temperature of 97.2, pulse 92, respiratory 18, blood pressure 160/81. Weight is 177.  Head and Neck:  Normocephalic, atraumatic skull.  There are no ocular or  oral lesions.  There are no palpable cervical or supraclavicular lymph nodes.  Lungs:  Clear to percussion and auscultation bilaterally.  There are no rales, wheezes, or rhonchi. Cardiac:  Regular rate and rhythm with normal S1 and S2.  She has a 1/6 systolic ejection murmur.  Abdomen:  Soft with good bowel sounds.  There is no fluid wave.  There is no guarding or rebound tenderness.  She has well-healed laparoscopy scars.  She has no palpable hepatosplenomegaly. Back:  Lumbar laminectomy scar.  No tenderness is noted over the spine, ribs, or hips.  Extremities:  No clubbing, cyanosis, or edema.  She has good range of motion of her joints.  Skin:  Some slight ruddy complexion to her skin.  There may be some slight facial plethora.  Neurologic:  No focal neurological deficits.  LABORATORY STUDIES:  White cell count is 8.5, hemoglobin 16.4, hematocrit 51.5, platelet count 607.  MCV is 77.  Peripheral smear shows microcytic population of red blood cells.  There is some mild anisocytosis.  I do see some immature red cells.  There is a rare nucleated red cell.  I see no teardrops.  There is no rouleaux formation.  I see no schistocytes or spherocytes.  No target cells are noted.  White cells show no hypersegmented polys.  I see some immature myeloid cells.  There are some myelocytes.  There are a few metamyelocytes.  I do not see any blasts.  I did not see any promyelocytes.  Platelets are increased in number.  She has numerous large platelets.  She has a few mega platelets.  IMPRESSION:  Ms. Salamon is an 78 year old white female who, in my mind, has an underlying bone marrow disorder.  I have to believe that she is going to have a myeloproliferative disorder. She has a low MCV.  One would have to suspect that her iron is on the low side.  Her low iron along with her erytho- cytosis would be very consistent with polycythemia.  The platelets are very abnormally shaped.  This might make her  possibly having essential thrombocythemia.  Typically with thrombocythemia, one would expect some degree of anemia.  I also cannot rule out chronic myeloid leukemia.  She does have some immature myeloid cells.  She also has some immature erythroid cells.  I think we are going to have to do a bone marrow test on [name]. I do not see any way around not doing one.  I believe that with a bone marrow test, we get the necessary cytogenetics, flow cytometry, and FISH studies so that we can make a diagnosis of her underlying myeloproliferative disorder.  Again, I would suspect that she may actually have polycythemia.  I did send off iron studies.  I did send off a von Willebrand panel on her.  I suspect that we are probably going to end up having to both phlebotomize her and get her on Hydrea.  I think a key test is going to be the JAK2 mutation.  I would suspect that this is going to be positive.  With the bone marrow test, I will send off the BCR-ABL assay.  I spent a good hour and a half with [name].  I gave her a prayer blanket.  We had good fellowship.  She very much enjoyed the prayer blanket. I answered all of her questions.  She certainly is nervous, which I can understand.  I told her that we would get her through all this and that she will be okay, and that this is a condition that she will live with but not die from.  I probably will get her back to see me in another 3 weeks or so depending on when the bone marrow results come back.    ______________________________ Josph Macho, M.D. PRE/MEDQ  D:  10/29/2011  T:  10/29/2011  Job:  [id number]

## 2011-10-30 ENCOUNTER — Ambulatory Visit
Admission: RE | Admit: 2011-10-30 | Discharge: 2011-10-30 | Disposition: A | Discharge status: Home / Self Care | Payer: Medicare Other | Source: Ambulatory Visit | Attending: Internal Medicine | Admitting: Internal Medicine

## 2011-10-30 ENCOUNTER — Other Ambulatory Visit: Payer: Self-pay | Admitting: Internal Medicine

## 2011-10-30 ENCOUNTER — Telehealth: Payer: Self-pay | Admitting: Hematology & Oncology

## 2011-10-30 DIAGNOSIS — C50919 Malignant neoplasm of unspecified site of unspecified female breast: Secondary | ICD-10-CM | POA: Diagnosis not present

## 2011-10-30 DIAGNOSIS — R928 Other abnormal and inconclusive findings on diagnostic imaging of breast: Secondary | ICD-10-CM

## 2011-10-30 DIAGNOSIS — N63 Unspecified lump in unspecified breast: Secondary | ICD-10-CM | POA: Diagnosis not present

## 2011-10-30 DIAGNOSIS — R599 Enlarged lymph nodes, unspecified: Secondary | ICD-10-CM | POA: Diagnosis not present

## 2011-10-30 NOTE — Telephone Encounter (Signed)
Left pt message with 7-2 and 7-18 appointments and to call for details

## 2011-10-30 NOTE — Telephone Encounter (Signed)
i called pt and gave apt date/time of bone marrow on 7/2 with instructions to go to [facility].  i also gave follow up apt date/time for 7/19.  Pt is aware of both apts

## 2011-10-31 ENCOUNTER — Other Ambulatory Visit: Payer: Self-pay | Admitting: Internal Medicine

## 2011-10-31 DIAGNOSIS — D0510 Intraductal carcinoma in situ of unspecified breast: Secondary | ICD-10-CM

## 2011-11-02 LAB — VON WILLEBRAND PANEL
Coagulation Factor VIII: 52 % — ABNORMAL LOW (ref 73–140)
Ristocetin Co-factor, Plasma: 50 % (ref 42–200)
Von Willebrand Antigen, Plasma: 98 % (ref 50–217)

## 2011-11-02 LAB — APTT: aPTT: 45 seconds — ABNORMAL HIGH (ref 24–37)

## 2011-11-05 ENCOUNTER — Ambulatory Visit (INDEPENDENT_AMBULATORY_CARE_PROVIDER_SITE_OTHER): Payer: Medicare Other | Admitting: Internal Medicine

## 2011-11-05 VITALS — BP 122/80 | HR 76 | Temp 98.1°F | Wt 174.0 lb

## 2011-11-05 DIAGNOSIS — D751 Secondary polycythemia: Secondary | ICD-10-CM | POA: Insufficient documentation

## 2011-11-05 DIAGNOSIS — C50919 Malignant neoplasm of unspecified site of unspecified female breast: Secondary | ICD-10-CM | POA: Diagnosis not present

## 2011-11-05 DIAGNOSIS — D473 Essential (hemorrhagic) thrombocythemia: Secondary | ICD-10-CM

## 2011-11-05 DIAGNOSIS — D75839 Thrombocytosis, unspecified: Secondary | ICD-10-CM

## 2011-11-05 DIAGNOSIS — R079 Chest pain, unspecified: Secondary | ICD-10-CM | POA: Diagnosis not present

## 2011-11-05 DIAGNOSIS — G459 Transient cerebral ischemic attack, unspecified: Secondary | ICD-10-CM

## 2011-11-05 DIAGNOSIS — E785 Hyperlipidemia, unspecified: Secondary | ICD-10-CM | POA: Diagnosis not present

## 2011-11-05 NOTE — Progress Notes (Signed)
  Subjective:    Patient ID: [name], female    DOB: 1929-10-30, 78 y.o.   MRN: [id number]  HPI Routine visit, needs to discuss recent health issues. Diagnosed with breast cancer few days ago. History of thrombocytopenia, status post hematology evaluation, CML?Marland Kitchen Was at the ER recently with  TIA sx  3 days after she discontinue aspirin because a hurt in the stomach, she is now back on aspirin 81 mg without any side effects. ER notes are reviewed, they recommended either aspirin or Plavix. Also complains of pain at the left side of the chest and abdomen. Pain is worse with certain movements or taking a deep breath. Symptoms started immediately after a mammogram.  Past Medical History:  Headache - migraines, codeine prn  Hypertension  Hyperlipidemia  Osteoporosis  HEART, CV:  --Mild nonobstructive disease on cath in 2003.  --Probable Takotsubo cardiomyopathy: Severe chest pain with normal cath in 1994. Severe chest pain in 2003 with widespread T wave inversions on ECG. Cath with minimal coronary disease but LV-gram showed periapical severe hypokinesis and basilar hyperkinesia (EF 40%). Last echo in 4/09 confirmed full LV functional recovery with EF 60%, no regional wall motion abnormalities, mild to moderate LVH.  H/O CVA  ----------------------------------------------  GI:  GERD Hiatal hernia  Hemorrhoids  Hyperplastic colon polyp, 06/2001  Functional LLQ pain  h/o diverticuli  -----------------------------------------------  Obstructive sleep apnea  Varicose veins  h/o R leg DVT after a venous ablation  OA with bilateral knee pain  4-09 L2 fracture status post vertebroplasty of L2 September 10, 2007 performed by IR  Depression/anxiety -lost husband   Past Surgical History:  Appendectomy  left ovarian cyst  3/07 - stress test neg  Tonsillectomy  Back Surgery  Tubal Ligation  Dental extraction L maxillary molar  Kyphoplasty 08/2007  left total knee replacement 06/2010    Cholecystectomy, 03-2011   Social History: Married, lost husband 07-29-07.  Lives in [place]. lives in a town house, her daughter- GS live w/ her  driving  Former Smoker: quit 1990 Alcohol use-yes children x 3 ( 2 in [place] , see them occasionaly) Daily Caffeine Use coffee  Review of Systems No nausea, vomiting or diarrhea. No fever chills, no abdominal pain. No GERD type of symptoms. No difficulty breathing. Good compliance with all medicines including a low dose of Crestor.     Objective:   Physical Exam  Abdominal:     General -- alert, well-developed. No apparent distress.  Lungs -- normal respiratory effort, no intercostal retractions, no accessory muscle use, and normal breath sounds. Slightly tender at the chest wall. See graphic  Heart-- normal rate, regular rhythm, no murmur, and no gallop.   Abdomen--soft, not distended, slightly tender, see graphic. Extremities-- no pretibial edema bilaterally  Neurologic-- alert & oriented X3 and strength normal in all extremities. Psych-- Cognition and judgment appear intact. Alert and cooperative with normal attention span and concentration.  not anxious appearing and not depressed appearing.          Assessment & Plan:

## 2011-11-05 NOTE — Assessment & Plan Note (Signed)
Recent diagnosis of breast cancer, to see surgery soon.

## 2011-11-05 NOTE — Assessment & Plan Note (Signed)
Recent TIA sx in the setting of "skipping " ASA x 3 days, not clear if that trigger the event Plan: ASA 325

## 2011-11-05 NOTE — Assessment & Plan Note (Signed)
Tolerates low dose of crestor, labs

## 2011-11-05 NOTE — Patient Instructions (Signed)
Please come back fasting: FLP AST ALT--- dx high cholesterol

## 2011-11-05 NOTE — Assessment & Plan Note (Signed)
Chest- abd pain immediately after a MMG, mechanical features rec observation To a have a breast MRI soon (per pt), that should disclose abnormalities in the surroinding areas If pain no better in few days, pt will call

## 2011-11-05 NOTE — Assessment & Plan Note (Addendum)
Recently saw hematology, CML? Workup in progress. Situation complicated by concomitant dx of  breast cancer. Fortunately, the patient is keeping high spiritss, she is counseled. I think she is in very good hands and I let her know that.

## 2011-11-06 ENCOUNTER — Other Ambulatory Visit (HOSPITAL_COMMUNITY): Payer: Medicare Other

## 2011-11-06 ENCOUNTER — Encounter: Payer: Self-pay | Admitting: Internal Medicine

## 2011-11-06 ENCOUNTER — Ambulatory Visit
Admission: RE | Admit: 2011-11-06 | Discharge: 2011-11-06 | Disposition: A | Discharge status: Home / Self Care | Payer: Medicare Other | Source: Ambulatory Visit | Attending: Internal Medicine | Admitting: Internal Medicine

## 2011-11-06 DIAGNOSIS — D0510 Intraductal carcinoma in situ of unspecified breast: Secondary | ICD-10-CM

## 2011-11-06 DIAGNOSIS — Z803 Family history of malignant neoplasm of breast: Secondary | ICD-10-CM | POA: Diagnosis not present

## 2011-11-06 DIAGNOSIS — R928 Other abnormal and inconclusive findings on diagnostic imaging of breast: Secondary | ICD-10-CM | POA: Diagnosis not present

## 2011-11-06 MED ORDER — GADOBENATE DIMEGLUMINE 529 MG/ML IV SOLN
16.0000 mL | Freq: Once | INTRAVENOUS | Status: AC | PRN
  Administered 2011-11-06: 16 mL via INTRAVENOUS

## 2011-11-09 ENCOUNTER — Other Ambulatory Visit (INDEPENDENT_AMBULATORY_CARE_PROVIDER_SITE_OTHER): Payer: Medicare Other

## 2011-11-09 DIAGNOSIS — E7800 Pure hypercholesterolemia, unspecified: Secondary | ICD-10-CM | POA: Diagnosis not present

## 2011-11-09 LAB — LIPID PANEL
Cholesterol: 170 mg/dL (ref 0–200)
HDL: 52.1 mg/dL (ref >39.00)
LDL Cholesterol: 92 mg/dL (ref 0–99)
Total CHOL/HDL Ratio: 3
Triglycerides: 131 mg/dL (ref 0.0–149.0)
VLDL: 26.2 mg/dL (ref 0.0–40.0)

## 2011-11-09 LAB — AST: AST: 18 U/L (ref 0–37)

## 2011-11-09 LAB — ALT: ALT: 13 U/L (ref 0–35)

## 2011-11-09 NOTE — Progress Notes (Signed)
Labs only

## 2011-11-12 ENCOUNTER — Encounter: Payer: Self-pay | Admitting: *Deleted

## 2011-11-13 ENCOUNTER — Other Ambulatory Visit (HOSPITAL_COMMUNITY): Payer: Medicare Other

## 2011-11-13 ENCOUNTER — Ambulatory Visit (HOSPITAL_COMMUNITY)
Admission: RE | Admit: 2011-11-13 | Discharge: 2011-11-13 | Disposition: A | Discharge status: Home / Self Care | Payer: Medicare Other | Source: Ambulatory Visit | Attending: Hematology & Oncology | Admitting: Hematology & Oncology

## 2011-11-13 ENCOUNTER — Ambulatory Visit (HOSPITAL_BASED_OUTPATIENT_CLINIC_OR_DEPARTMENT_OTHER): Payer: Medicare Other | Admitting: Hematology & Oncology

## 2011-11-13 VITALS — BP 130/63 | HR 66 | Temp 97.0°F | Resp 18 | Ht 67.0 in | Wt 174.0 lb

## 2011-11-13 DIAGNOSIS — D473 Essential (hemorrhagic) thrombocythemia: Secondary | ICD-10-CM | POA: Diagnosis not present

## 2011-11-13 DIAGNOSIS — D471 Chronic myeloproliferative disease: Secondary | ICD-10-CM

## 2011-11-13 DIAGNOSIS — D751 Secondary polycythemia: Secondary | ICD-10-CM

## 2011-11-13 DIAGNOSIS — D45 Polycythemia vera: Secondary | ICD-10-CM | POA: Insufficient documentation

## 2011-11-13 DIAGNOSIS — D47Z9 Other specified neoplasms of uncertain behavior of lymphoid, hematopoietic and related tissue: Secondary | ICD-10-CM

## 2011-11-13 LAB — CBC WITH DIFFERENTIAL/PLATELET
Basophils Absolute: 0.1 10*3/uL (ref 0.0–0.1)
Basophils Relative: 1 % (ref 0–1)
Eosinophils Absolute: 0.4 10*3/uL (ref 0.0–0.7)
Eosinophils Relative: 5 % (ref 0–5)
HCT: 50.1 % — ABNORMAL HIGH (ref 36.0–46.0)
Hemoglobin: 15.7 g/dL — ABNORMAL HIGH (ref 12.0–15.0)
Lymphocytes Relative: 13 % (ref 12–46)
Lymphs Abs: 1 10*3/uL (ref 0.7–4.0)
MCH: 24.6 pg — ABNORMAL LOW (ref 26.0–34.0)
MCHC: 31.3 g/dL (ref 30.0–36.0)
MCV: 78.5 fL (ref 78.0–100.0)
Monocytes Absolute: 0.8 10*3/uL (ref 0.1–1.0)
Monocytes Relative: 10 % (ref 3–12)
Neutro Abs: 5.2 10*3/uL (ref 1.7–7.7)
Neutrophils Relative %: 70 % (ref 43–77)
Platelets: 575 10*3/uL — ABNORMAL HIGH (ref 150–400)
RBC: 6.38 MIL/uL — ABNORMAL HIGH (ref 3.87–5.11)
RDW: 15.6 % — ABNORMAL HIGH (ref 11.5–15.5)
WBC: 7.4 10*3/uL (ref 4.0–10.5)

## 2011-11-13 MED ORDER — MIDAZOLAM HCL 5 MG/5ML IJ SOLN
INTRAMUSCULAR | Status: AC | PRN
  Administered 2011-11-13 (×4): 1 mg via INTRAVENOUS

## 2011-11-13 MED ORDER — MEPERIDINE HCL 50 MG/ML IJ SOLN
INTRAMUSCULAR | Status: AC
  Filled 2011-11-13: qty 1

## 2011-11-13 MED ORDER — SODIUM CHLORIDE 0.9 % IV SOLN
INTRAVENOUS | Status: DC
  Administered 2011-11-13: 07:00:00 via INTRAVENOUS

## 2011-11-13 MED ORDER — MEPERIDINE HCL 25 MG/ML IJ SOLN
INTRAMUSCULAR | Status: AC | PRN
  Administered 2011-11-13 (×2): 12.5 mg via INTRAVENOUS

## 2011-11-13 MED ORDER — MIDAZOLAM HCL 10 MG/2ML IJ SOLN
INTRAMUSCULAR | Status: AC
  Filled 2011-11-13: qty 2

## 2011-11-13 NOTE — ED Notes (Signed)
Dressing CDI ambulated in room with assistance tolerated this well

## 2011-11-13 NOTE — Discharge Instructions (Signed)
Do not drive  For 24 hours Do not go into public places today May resume your regular diet and take home medications as usual May experience small amount of tingling in leg (biopsy side) May take shower and remove bandage in am For any questions or concerns, call Dr Myna Hidalgo If bleeding occurs at site, hold pressure x10 minutes  If continues, call Dr Myna Hidalgo  Bone Marrow Aspiration, Bone Marrow Biopsy Care After Read the instructions outlined below and refer to this sheet in the next few weeks. These discharge instructions provide you with general information on caring for yourself after you leave the hospital. Your caregiver may also give you specific instructions. While your treatment has been planned according to the most current medical practices available, unavoidable complications occasionally occur. If you have any problems or questions after discharge, call your caregiver. FINDING OUT THE RESULTS OF YOUR TEST Not all test results are available during your visit. If your test results are not back during the visit, make an appointment with your caregiver to find out the results. Do not assume everything is normal if you have not heard from your caregiver or the medical facility. It is important for you to follow up on all of your test results.  HOME CARE INSTRUCTIONS  You have had sedation and may be sleepy or dizzy. Your thinking may not be as clear as usual. For the next 24 hours:  Only take over-the-counter or prescription medicines for pain, discomfort, and or fever as directed by your caregiver.   Do not drink alcohol.   Do not smoke.   Do not drive.   Do not make important legal decisions.   Do not operate heavy machinery.   Do not care for small children by yourself.   Keep your dressing clean and dry. You may replace dressing with a bandage after 24 hours.   You may take a bath or shower after 24 hours.   Use an ice pack for 20 minutes every 2 hours while awake for pain  as needed.  SEEK MEDICAL CARE IF:   There is redness, swelling, or increasing pain at the biopsy site.   There is pus coming from the biopsy site.   There is drainage from a biopsy site lasting longer than one day.   An unexplained oral temperature above 102 F (38.9 C) develops.  SEEK IMMEDIATE MEDICAL CARE IF:   You develop a rash.   You have difficulty breathing.   You develop any reaction or side effects to medications given.  Moderate Sedation, Adult Moderate sedation is given to help you relax or even sleep through a procedure. You may remain sleepy, be clumsy, or have poor balance for several hours following this procedure. Arrange for a responsible adult, family member, or friend to take you home. A responsible adult should stay with you for at least 24 hours or until the medicines have worn off.  Do not participate in any activities where you could become injured for the next 24 hours, or until you feel normal again. Do not:   Drive.   Swim.   Ride a bicycle.   Operate heavy machinery.   Cook.   Use power tools.   Climb ladders.   Work at [employer].   Do not make important decisions or sign legal documents until you are improved.   Vomiting may occur if you eat too soon. When you can drink without vomiting, try water, juice, or soup. Try solid foods if you  feel little or no nausea.   Only take over-the-counter or prescription medications for pain, discomfort, or fever as directed by your caregiver.If pain medications have been prescribed for you, ask your caregiver how soon it is safe to take them.   Make sure you and your family fully understands everything about the medication given to you. Make sure you understand what side effects may occur.   You should not drink alcohol, take sleeping pills, or medications that cause drowsiness for at least 24 hours.   If you smoke, do not smoke alone.   If you are feeling better, you may resume normal activities 24  hours after receiving sedation.   Keep all appointments as scheduled. Follow all instructions.   Ask questions if you do not understand.  SEEK MEDICAL CARE IF:   Your skin is pale or bluish in color.   You continue to feel sick to your stomach (nauseous) or throw up (vomit).   Your pain is getting worse and not helped by medication.   You have bleeding or swelling.   You are still sleepy or feeling clumsy after 24 hours.  SEEK IMMEDIATE MEDICAL CARE IF:   You develop a rash.   You have difficulty breathing.   You develop any type of allergic problem.   You have a fever.  Document Released: 01/23/2001 Document Revised: 04/19/2011 Document Reviewed: 06/16/2007 [facility] Patient Information 2012 [place], [place].

## 2011-11-13 NOTE — ED Notes (Signed)
Patient denies pain and is resting comfortably.  

## 2011-11-13 NOTE — ED Notes (Signed)
Dressing CDI 

## 2011-11-13 NOTE — Sedation Documentation (Signed)
Medication dose calculated and verified for: Demerol 25 mg and Versed 4mg 

## 2011-11-13 NOTE — ED Notes (Signed)
Family updated as to patient's status.

## 2011-11-13 NOTE — ED Notes (Signed)
Dressing remains C/D/I.

## 2011-11-13 NOTE — ED Notes (Signed)
Patient is resting comfortably. 

## 2011-11-13 NOTE — ED Notes (Signed)
Vital signs stable. 

## 2011-11-13 NOTE — Progress Notes (Signed)
This is a procedure note for [name].  Nature of the procedure his bone marrow biopsy and aspirate  The patient was brought to the short stay unit at [facility]. She had an IV placed without difficulty.  Her Mallimpati score is 1 and  ASA Class is 2.  She is placed on her right side. She received a total of 4 mg of Versed and 25 mg Demerol for IV sedation.  The left posterior iliac crest region was prepped and draped in sterile fashion. 10 cc of 2% lidocaine was infiltrated under the skin and down to the periosteum.  A #11 scalpel was used to make an incision into the skin. 2 bone marrow aspirates were obtained without difficulty.  I then obtained 2 bone marrow biopsy cores with a Jamshidi needle.  The patient tolerated the procedure well. There were no complications. The procedure site was dressed sterilely. The patient was turned back onto her back.  I spoke to her daughter afterwards. I gave her a synopsis of her mother is hematologic situation.  Pete E.

## 2011-11-13 NOTE — ED Notes (Signed)
Dressing to left posterior iliac area.pt placed supine with dressing to site for pressure

## 2011-11-19 ENCOUNTER — Ambulatory Visit (INDEPENDENT_AMBULATORY_CARE_PROVIDER_SITE_OTHER)
Admission: RE | Admit: 2011-11-19 | Discharge: 2011-11-19 | Disposition: A | Discharge status: Home / Self Care | Payer: Medicare Other | Source: Ambulatory Visit | Attending: Internal Medicine | Admitting: Internal Medicine

## 2011-11-19 ENCOUNTER — Encounter: Payer: Self-pay | Admitting: Internal Medicine

## 2011-11-19 ENCOUNTER — Ambulatory Visit (INDEPENDENT_AMBULATORY_CARE_PROVIDER_SITE_OTHER): Payer: Medicare Other | Admitting: Internal Medicine

## 2011-11-19 VITALS — BP 134/82 | HR 81 | Temp 97.5°F | Wt 175.0 lb

## 2011-11-19 DIAGNOSIS — R079 Chest pain, unspecified: Secondary | ICD-10-CM

## 2011-11-19 DIAGNOSIS — K449 Diaphragmatic hernia without obstruction or gangrene: Secondary | ICD-10-CM | POA: Diagnosis not present

## 2011-11-19 NOTE — Progress Notes (Signed)
  Subjective:    Patient ID: [name], female    DOB: 1929-06-25, 77 y.o.   MRN: [id number]  HPI Acute visit Since her last office visit 11/06/2011, the left-sided chest pain "migrated"  to the right side. Currently having pain @ the R  chest wall and epigastric area. Pain is worse by moving her torso or touching the area. Also worse if she coughs, not worse with deep breaths. Pain does not change by eating. She's not taking NSAIDs  Past Medical History:   Headache - migraines, codeine prn   Hypertension   Hyperlipidemia   Osteoporosis   HEMATOLOGY Breast cancer 10-2011 Thrombocytosis HEART, CV:  --Mild nonobstructive disease on cath in 2003.   --Probable Takotsubo cardiomyopathy: Severe chest pain with normal cath in 1994. Severe chest pain in 2003 with widespread T wave inversions on ECG. Cath with minimal coronary disease but LV-gram showed periapical severe hypokinesis and basilar hyperkinesia (EF 40%). Last echo in 4/09 confirmed full LV functional recovery with EF 60%, no regional wall motion abnormalities, mild to moderate LVH.   H/O CVA   ----------------------------------------------   GI:  GERD Hiatal hernia   Hemorrhoids   Hyperplastic colon polyp, 06/2001   Functional LLQ pain   h/o diverticuli   -----------------------------------------------   Obstructive sleep apnea   Varicose veins   h/o R leg DVT after a venous ablation   OA with bilateral knee pain   4-09 L2 fracture status post vertebroplasty of L2 September 10, 2007 performed by IR   Depression/anxiety -lost husband    Past Surgical History:   Appendectomy   left ovarian cyst   3/07 - stress test neg   Tonsillectomy   Back Surgery   Tubal Ligation   Dental extraction L maxillary molar   Kyphoplasty 08/2007   left total knee replacement 06/2010   Cholecystectomy, 03-2011   Social History: Married, lost husband 07-29-07.  Lives in [place]. lives in a town house, her daughter- GS live w/ her     driving   Former Smoker: quit 1990 Alcohol use-yes children x 3 ( 2 in [place] , see them occasionaly) Daily Caffeine Use coffee   Review of Systems No fever or chills No cough, shortness of breath at baseline. No  nausea, vomiting, diarrhea or heartburn. No dysuria gross hematuria     Objective:   Physical Exam  Constitutional: She appears well-developed. No distress.  Pulmonary/Chest: Effort normal and breath sounds normal. No respiratory distress. She has no wheezes. She has no rales.  Abdominal:    Musculoskeletal: She exhibits no edema.  Skin: She is not diaphoretic.       No blisters anywhere in the torso, chest or abdomen          Assessment & Plan:

## 2011-11-19 NOTE — Assessment & Plan Note (Addendum)
See previous entry, "chest pain" is now located at the right chest wall and epigastric area. GI review of systems negative, the pain continued to have mechanical features. Shortness of breath  is at baseline. DDX includes a muscle skeletal issue, radiculopathy, zoster, less likely a biliary or gastric issue. Plan: Chest x-ray and rib x-rays, if neg, proceed w/a CT chest and abdomen Continue PPIs but again GI review of systems negative Pain control w/ codeine  Addendum: Chest x-ray negative, repeat x-rays --> slight deformity of the anterior aspects of the right fifth andseventh ribs which could represent nondisplaced fractures of theribs. No lytic lesions. Nothing to explain her initially left-sided and now right-sided pain. I also talked to Dr.  Dwain Sarna  the patient is to have surgery 11/22/2011, a lumpectomy consequently this is not the right time to proceed with a CT of the chest or abdomen.   Plan: Recheck on her next week. Incidentally, we discussed aspirin therapy before a lumpectomy, I recommend to stay on current dose of aspirin if possible given her history of  TIAs in the setting of discontinue aspirin temporarily.

## 2011-11-19 NOTE — Patient Instructions (Addendum)
Please get your x-ray at the other [facility]  office located at: [address] [place], across from [facility].  Please go to the basement, this is a walk-in facility, they are open from 8:30 to 5:30 PM. Phone number [phone]. ---- For pain, use her Tylenol with Codeine. Call if no better in  a few days Call anytime if the symptoms are severe

## 2011-11-20 ENCOUNTER — Ambulatory Visit (INDEPENDENT_AMBULATORY_CARE_PROVIDER_SITE_OTHER): Payer: Medicare Other | Admitting: General Surgery

## 2011-11-20 ENCOUNTER — Encounter (INDEPENDENT_AMBULATORY_CARE_PROVIDER_SITE_OTHER): Payer: Medicare Other | Admitting: General Surgery

## 2011-11-20 ENCOUNTER — Encounter (INDEPENDENT_AMBULATORY_CARE_PROVIDER_SITE_OTHER): Payer: Self-pay | Admitting: General Surgery

## 2011-11-20 ENCOUNTER — Other Ambulatory Visit (INDEPENDENT_AMBULATORY_CARE_PROVIDER_SITE_OTHER): Payer: Self-pay | Admitting: General Surgery

## 2011-11-20 VITALS — BP 142/92 | HR 103 | Temp 97.8°F | Ht 67.0 in | Wt 176.2 lb

## 2011-11-20 DIAGNOSIS — C50419 Malignant neoplasm of upper-outer quadrant of unspecified female breast: Secondary | ICD-10-CM

## 2011-11-20 NOTE — Progress Notes (Signed)
Patient ID: [name], female   DOB: [birth year], 78 y.o.   MRN: [id number]  Chief Complaint  Patient presents with  . Pre-op Exam    eval Lt br ca    HPI [name] is a 78 y.o. female.  Referred by Dr. Chilton Si HPI This is an 78 year old female with multiple medical problems including recently diagnosed polycythemia as well as transient ischemic attack off aspirin in the last couple of months.She otherwise is very active and doing well. She also underwent a screening mammogram for the first time in 2 years. She had no breast complaints prior to this. She does have a significant family history her mom and her sister her breast cancer.She is screening mammogram with a left breast abnormality. This underwent a biopsy showing an invasive ductal carcinoma that is 100% estrogen and progesterone receptor positive and HER-2/neu negative. She had no other abnormalities on MRI including no lymphadenopathy and no right breast abnormalities and shows only post-biopsy change with a 9 mm oval nodular area of enhancement. She comes in today to discuss her options. Past Medical History  Diagnosis Date  . Headache     migraines  . Hyperlipidemia   . Osteoporosis   . CVA (cerebral infarction)   . Hemorrhoids   . Hyperplastic colon polyp 06/2001  . Diverticulitis   . Varicose vein   . DVT (deep venous thrombosis)     after venous ablation, R leg  . Fracture 09/10/07    L2, status post vertebroplasty of L2 performed by IR  . Cardiomyopathy     Probable Takotsubo, severe CP w/ normal cath in 1994. Severe CP in 2003 w/ widespread T wave inversions on ECG. Cath w/ minimal coronary disease but LV-gram showed periapical severe hypokinesis and basilar hyperkinesia (EF 40%). Last echo in 4/09 confirmed full LV functional recovery with EF 60%, no regional wall motion abnormalities, mild to moderate LVH.  . OSA (obstructive sleep apnea) 03-29-11    no cpap used, not a problem now.  Marland Kitchen CAD (coronary artery  disease)     mild nonobstructive disease on cath in 2003  . Hypertension   . GERD (gastroesophageal reflux disease)   . Hiatal hernia 03-29-11    no nerve problems  . Depression with anxiety 03-29-11    lost husband 3'09  . OA (osteoarthritis) of knee 03-29-11    w/ bilateral knee pain-not a problem now  . Stroke 03-29-11    CVA x2 -last 10'12-?TIA(visual problems)  . E. coli gastroenteritis 03-29-11    8'10  . Cancer     Past Surgical History  Procedure Date  . Appendectomy   . Ovarian cyst surgery     left  . Tonsillectomy   . Back surgery   . Tubal ligation   . Dental extraction     L maxillary molar  . Kyphosis surgery 08/2007    cement used  . Total knee arthroplasty 06/2010    left  . Cholecystectomy 04/09/2011    Procedure: LAPAROSCOPIC CHOLECYSTECTOMY WITH INTRAOPERATIVE CHOLANGIOGRAM;  Surgeon: Adolph Pollack, MD;  Location: [facility];  Service: General;  Laterality: N/A;    Family History  Problem Relation Age of Onset  . Colon polyps      cousin  . Colon cancer      cousin  . Breast cancer Mother   . Breast cancer Sister   . Heart disease Father     MIs  . Leukemia Brother     GM  Social History History  Substance Use Topics  . Smoking status: Former Smoker    Quit date: 05/14/1988  . Smokeless tobacco: Never Used  . Alcohol Use: 0.6 oz/week    1 Cans of beer per week    Allergies  Allergen Reactions  . Atorvastatin Diarrhea    REACTION: diarrhe  . Ezetimibe     REACTION: nausea  . Fluvastatin Sodium Other (See Comments)    "Can't remember"  . Magnesium Hydroxide Other (See Comments)    REACTION: triggers HAs  . Penicillins Other (See Comments)    Mouth blisters 03-29-11  . Quinapril Hcl Other (See Comments)    03-29-11 "feelings of tiredness"  . Rosuvastatin Other (See Comments)    REACTION: jaw pain  . Simvastatin Diarrhea  . Topamax (Topiramate) Itching  . Valsartan Itching and Other (See Comments)    REACTION: toes itch     Current Outpatient Prescriptions  Medication Sig Dispense Refill  . acetaminophen-codeine (TYLENOL #3) 300-30 MG per tablet TAKE 1 TABLET BY MOUTH EVERY 8 HOURS AS NEEDED FOR MIGRAINE  90 tablet  0  . amLODipine (NORVASC) 5 MG tablet Take 5 mg by mouth daily.      Marland Kitchen aspirin 325 MG tablet Take 325 mg by mouth daily.      . Calcium Carbonate Antacid (TUMS ULTRA 1000 PO) Take 2 tablets by mouth daily.       . Coenzyme Q10 200 MG capsule Take 1 capsule (200 mg total) by mouth daily.      Marland Kitchen docusate sodium (COLACE) 100 MG capsule Take 100 mg by mouth daily as needed. For constipation      . furosemide (LASIX) 20 MG tablet Take 20 mg by mouth daily as needed. For fluid retention      . LORazepam (ATIVAN) 0.5 MG tablet TAKE 1 TABLET BY MOUTH EVERY DAY AS NEEDED FOR ANXIETY  90 tablet  0  . metoprolol (LOPRESSOR) 50 MG tablet Take 0.5 tablets (25 mg total) by mouth 2 (two) times daily.  30 tablet  6  . omeprazole (PRILOSEC) 40 MG capsule Take 40 mg by mouth daily.        . rosuvastatin (CRESTOR) 5 MG tablet Take 2.5 mg by mouth daily. Take one every other day      . vitamin E 400 UNIT capsule Take 400 Units by mouth daily.         Review of Systems Review of Systems  Blood pressure 142/92, pulse 103, temperature 97.8 F (36.6 C), temperature source Temporal, height 5\' 7"  (1.702 m), weight 176 lb 3.2 oz (79.924 kg), SpO2 97.00%.  Physical Exam Physical Exam  Vitals reviewed. Constitutional: She appears well-developed and well-nourished.  Neck: Neck supple.  Cardiovascular: Normal rate, regular rhythm and normal heart sounds.   Pulmonary/Chest: Effort normal and breath sounds normal. She has no wheezes. She has no rales. Right breast exhibits no inverted nipple, no mass, no nipple discharge, no skin change and no tenderness. Left breast exhibits no inverted nipple, no mass, no nipple discharge, no skin change and no tenderness. Breasts are symmetrical.    Lymphadenopathy:    She has no  cervical adenopathy.    She has no axillary adenopathy.       Right: No supraclavicular adenopathy present.       Left: No supraclavicular adenopathy present.    Data Reviewed BILATERAL BREAST MRI WITH AND WITHOUT CONTRAST  Technique: Multiplanar, multisequence MR images of both breasts  were obtained prior to  and following the intravenous administration  of 16ml of Multihance. Three dimensional images were evaluated at  the independent DynaCad workstation.  Comparison: Prior mammograms and ultrasound  Findings: Minimal T2 hyperintense stranding is noted in the left  lateral breast at the presumed site of prior biopsy. No clip  artifact is present because a clip was not placed. No other area  of abnormal T2-weighted hyperintensity in either breast. No  axillary or internal mammary artery chain lymphadenopathy.  Presumed hiatal hernia noted within the posterior mediastinum,  incompletely imaged at the inferior aspect of the field of view.  On postcontrast images, there is smooth rim enhancement at the left  breast three o'clock location posterior depth biopsy cavity. On  the initial postcontrast series, there is a 9 mm oval nodular area  of enhancement at the inferior aspect of the left breast three  o'clock location biopsy cavity, demonstrating washin/washout type  enhancement kinetics. This is felt to most likely correspond to a  portion of the previously sampled mass. There is motion artifact  on the second and third post contrast series. No other area of  abnormal enhancement is seen in either breast to suggest  multifocal/multicentric or contralateral malignancy.  IMPRESSION:  Post biopsy change in the left breast three o'clock location with 9  mm oval nodular area of enhancement at its inferior margin, likely  corresponding to the previously biopsied mass. No other area of  abnormal enhancement is seen in either breast to suggest  multifocal/multicentric or contralateral  malignancy.   Assessment    Left breast cancer, clinical stage I    Plan    Left breast wire guided lumpectomy   We discussed the staging and pathophysiology of breast cancer. We discussed all of the different options for treatment for breast cancer including surgery, chemotherapy, radiation therapy, Herceptin, and antiestrogen therapy.   We discussed a sentinel lymph node biopsy and I also discussed with Dr.  Myna Hidalgo.  Both of [place] agree omitting the sentinel node is reasonable given age, er/pr status and her other comorbidities. We discussed the options for treatment of the breast cancer which included lumpectomy versus a mastectomy. We discussed the performance of the lumpectomy with a wire placement. We discussed a 10-20% chance of a positive margin requiring reexcision in the operating room. We also discussed that she may need radiation therapy or antiestrogen therapy or both if she undergoes lumpectomy. We discussed the mastectomy and the postoperative care for that as well. We discussed that there is no difference in her survival whether she undergoes lumpectomy with radiation therapy or antiestrogen therapy versus a mastectomy. There is a slight difference in the local recurrence rate being 3-5% with lumpectomy and about 1% with a mastectomy. I will plan on leaving on aspirin after discussing with Dr. Drue Novel.  There is small risk of tia/stroke with surgery.  Will also touch base with cardiology although I think she is ok as she tolerated lap chole last year and is doing well. We discussed the risks of operation including bleeding, infection, possible reoperation. She understands her further therapy will be based on what her stages at the time of her operation.         Ameliana Brashear 11/20/2011, 3:30 PM

## 2011-11-21 ENCOUNTER — Encounter (HOSPITAL_COMMUNITY)
Admission: RE | Admit: 2011-11-21 | Discharge: 2011-11-21 | Disposition: A | Discharge status: Home / Self Care | Payer: Medicare Other | Source: Ambulatory Visit | Attending: General Surgery | Admitting: General Surgery

## 2011-11-21 ENCOUNTER — Encounter (HOSPITAL_COMMUNITY): Payer: Self-pay

## 2011-11-21 ENCOUNTER — Encounter (HOSPITAL_COMMUNITY): Payer: Self-pay | Admitting: Pharmacy Technician

## 2011-11-21 ENCOUNTER — Other Ambulatory Visit: Payer: Self-pay | Admitting: Internal Medicine

## 2011-11-21 DIAGNOSIS — Z01812 Encounter for preprocedural laboratory examination: Secondary | ICD-10-CM | POA: Diagnosis not present

## 2011-11-21 DIAGNOSIS — E785 Hyperlipidemia, unspecified: Secondary | ICD-10-CM | POA: Diagnosis not present

## 2011-11-21 DIAGNOSIS — I2510 Atherosclerotic heart disease of native coronary artery without angina pectoris: Secondary | ICD-10-CM | POA: Diagnosis not present

## 2011-11-21 DIAGNOSIS — C50419 Malignant neoplasm of upper-outer quadrant of unspecified female breast: Secondary | ICD-10-CM | POA: Diagnosis not present

## 2011-11-21 DIAGNOSIS — Z8673 Personal history of transient ischemic attack (TIA), and cerebral infarction without residual deficits: Secondary | ICD-10-CM | POA: Diagnosis not present

## 2011-11-21 DIAGNOSIS — I10 Essential (primary) hypertension: Secondary | ICD-10-CM | POA: Diagnosis not present

## 2011-11-21 DIAGNOSIS — I4891 Unspecified atrial fibrillation: Secondary | ICD-10-CM | POA: Diagnosis not present

## 2011-11-21 DIAGNOSIS — Z86718 Personal history of other venous thrombosis and embolism: Secondary | ICD-10-CM | POA: Diagnosis not present

## 2011-11-21 DIAGNOSIS — F430 Acute stress reaction: Secondary | ICD-10-CM | POA: Diagnosis not present

## 2011-11-21 HISTORY — DX: Secondary polycythemia: D75.1

## 2011-11-21 LAB — BASIC METABOLIC PANEL
BUN: 9 mg/dL (ref 6–23)
CO2: 30 mEq/L (ref 19–32)
Calcium: 9.9 mg/dL (ref 8.4–10.5)
Chloride: 101 mEq/L (ref 96–112)
Creatinine, Ser: 0.67 mg/dL (ref 0.50–1.10)
GFR calc Af Amer: >90 mL/min (ref >90)
GFR calc non Af Amer: 81 mL/min — ABNORMAL LOW (ref >90)
Glucose, Bld: 101 mg/dL — ABNORMAL HIGH (ref 70–99)
Potassium: 3.9 mEq/L (ref 3.5–5.1)
Sodium: 139 mEq/L (ref 135–145)

## 2011-11-21 LAB — CBC
HCT: 50.6 % — ABNORMAL HIGH (ref 36.0–46.0)
Hemoglobin: 16.4 g/dL — ABNORMAL HIGH (ref 12.0–15.0)
MCH: 25.5 pg — ABNORMAL LOW (ref 26.0–34.0)
MCHC: 32.4 g/dL (ref 30.0–36.0)
MCV: 78.6 fL (ref 78.0–100.0)
Platelets: 637 10*3/uL — ABNORMAL HIGH (ref 150–400)
RBC: 6.44 MIL/uL — ABNORMAL HIGH (ref 3.87–5.11)
RDW: 15.8 % — ABNORMAL HIGH (ref 11.5–15.5)
WBC: 8.5 10*3/uL (ref 4.0–10.5)

## 2011-11-21 LAB — SURGICAL PCR SCREEN
MRSA, PCR: NEGATIVE
Staphylococcus aureus: NEGATIVE

## 2011-11-21 NOTE — Pre-Procedure Instructions (Signed)
20 [name]  11/21/2011   Your procedure is scheduled on:  Thursday July 11  Report to [facility] at 8:30 AM or as soon as possible after Breast Center appointment at 8:30 AM.  Call this number if you have problems the morning of surgery: [phone]   Remember:   Do not eat food:After Midnight.  May have clear liquids:until Midnight .  Clear liquids include soda, tea, black coffee, apple or grape juice, broth.  Take these medicines the morning of surgery with A SIP OF WATER: Metoprolol, Amlodipine, Omeprazole. May take Lorazepam, Tylenol #3 if needed.   Do not wear jewelry, make-up or nail polish.  Do not wear lotions, powders, or perfumes. You may wear deodorant.  Do not shave 48 hours prior to surgery. Men may shave face and neck.  Do not bring valuables to the hospital.  Contacts, dentures or bridgework may not be worn into surgery.  Leave suitcase in the car. After surgery it may be brought to your room.  For patients admitted to the hospital, checkout time is 11:00 AM the day of discharge.   Patients discharged the day of surgery will not be allowed to drive home.  Name and phone number of your driver: family  Special Instructions: CHG Shower Use Special Wash: 1/2 bottle night before surgery and 1/2 bottle morning of surgery.   Please read over the following fact sheets that you were given: Pain Booklet, Coughing and Deep Breathing and Surgical Site Infection Prevention

## 2011-11-21 NOTE — Telephone Encounter (Signed)
Please ask patient how often is she taking it. T ID? qhs?  Okay to refill 90

## 2011-11-21 NOTE — Telephone Encounter (Signed)
Ok to refill 

## 2011-11-21 NOTE — Progress Notes (Signed)
Requested stress test, last OV note from [facility], pt unsure of year of study. Pt cardiologist Dr. Shirlee Latch.

## 2011-11-22 ENCOUNTER — Encounter (HOSPITAL_COMMUNITY): Payer: Self-pay | Admitting: *Deleted

## 2011-11-22 ENCOUNTER — Ambulatory Visit (HOSPITAL_COMMUNITY): Payer: Medicare Other | Admitting: Certified Registered"

## 2011-11-22 ENCOUNTER — Encounter (HOSPITAL_COMMUNITY)
Admission: RE | Disposition: A | Discharge status: Home / Self Care | Payer: Self-pay | Source: Ambulatory Visit | Attending: General Surgery

## 2011-11-22 ENCOUNTER — Ambulatory Visit
Admission: RE | Admit: 2011-11-22 | Discharge: 2011-11-22 | Disposition: A | Discharge status: Home / Self Care | Payer: Medicare Other | Source: Ambulatory Visit | Attending: General Surgery | Admitting: General Surgery

## 2011-11-22 ENCOUNTER — Ambulatory Visit (HOSPITAL_COMMUNITY)
Admission: RE | Admit: 2011-11-22 | Discharge: 2011-11-22 | Disposition: A | Discharge status: Home / Self Care | Payer: Medicare Other | Source: Ambulatory Visit | Attending: General Surgery | Admitting: General Surgery

## 2011-11-22 ENCOUNTER — Encounter (HOSPITAL_COMMUNITY): Payer: Self-pay | Admitting: Certified Registered"

## 2011-11-22 DIAGNOSIS — D0590 Unspecified type of carcinoma in situ of unspecified breast: Secondary | ICD-10-CM | POA: Diagnosis not present

## 2011-11-22 DIAGNOSIS — C50419 Malignant neoplasm of upper-outer quadrant of unspecified female breast: Secondary | ICD-10-CM | POA: Diagnosis not present

## 2011-11-22 DIAGNOSIS — E785 Hyperlipidemia, unspecified: Secondary | ICD-10-CM | POA: Diagnosis not present

## 2011-11-22 DIAGNOSIS — I6789 Other cerebrovascular disease: Secondary | ICD-10-CM | POA: Diagnosis not present

## 2011-11-22 DIAGNOSIS — Z8673 Personal history of transient ischemic attack (TIA), and cerebral infarction without residual deficits: Secondary | ICD-10-CM | POA: Insufficient documentation

## 2011-11-22 DIAGNOSIS — Z86718 Personal history of other venous thrombosis and embolism: Secondary | ICD-10-CM | POA: Insufficient documentation

## 2011-11-22 DIAGNOSIS — K219 Gastro-esophageal reflux disease without esophagitis: Secondary | ICD-10-CM | POA: Diagnosis not present

## 2011-11-22 DIAGNOSIS — Z01812 Encounter for preprocedural laboratory examination: Secondary | ICD-10-CM | POA: Insufficient documentation

## 2011-11-22 DIAGNOSIS — I10 Essential (primary) hypertension: Secondary | ICD-10-CM | POA: Insufficient documentation

## 2011-11-22 DIAGNOSIS — I2510 Atherosclerotic heart disease of native coronary artery without angina pectoris: Secondary | ICD-10-CM | POA: Diagnosis not present

## 2011-11-22 DIAGNOSIS — C50919 Malignant neoplasm of unspecified site of unspecified female breast: Secondary | ICD-10-CM | POA: Diagnosis not present

## 2011-11-22 DIAGNOSIS — I4891 Unspecified atrial fibrillation: Secondary | ICD-10-CM | POA: Insufficient documentation

## 2011-11-22 DIAGNOSIS — N63 Unspecified lump in unspecified breast: Secondary | ICD-10-CM | POA: Diagnosis not present

## 2011-11-22 DIAGNOSIS — F430 Acute stress reaction: Secondary | ICD-10-CM | POA: Insufficient documentation

## 2011-11-22 SURGERY — BREAST LUMPECTOMY WITH NEEDLE LOCALIZATION
Anesthesia: General | Site: Breast | Laterality: Left | Wound class: Clean

## 2011-11-22 MED ORDER — OXYCODONE-ACETAMINOPHEN 5-325 MG PO TABS: 1.0000 | ORAL_TABLET | ORAL | Status: DC | PRN

## 2011-11-22 MED ORDER — 0.9 % SODIUM CHLORIDE (POUR BTL) OPTIME
TOPICAL | Status: DC | PRN
  Administered 2011-11-22: 1000 mL

## 2011-11-22 MED ORDER — LIDOCAINE HCL (CARDIAC) 20 MG/ML IV SOLN
INTRAVENOUS | Status: DC | PRN
  Administered 2011-11-22: 100 mg via INTRAVENOUS

## 2011-11-22 MED ORDER — CEFAZOLIN SODIUM-DEXTROSE 2-3 GM-% IV SOLR
INTRAVENOUS | Status: AC
  Filled 2011-11-22: qty 50

## 2011-11-22 MED ORDER — BUPIVACAINE-EPINEPHRINE PF 0.25-1:200000 % IJ SOLN
INTRAMUSCULAR | Status: AC
  Filled 2011-11-22: qty 30

## 2011-11-22 MED ORDER — OXYCODONE-ACETAMINOPHEN 5-325 MG PO TABS
1.0000 | ORAL_TABLET | Freq: Once | ORAL | Status: AC
  Administered 2011-11-22: 1 via ORAL

## 2011-11-22 MED ORDER — LACTATED RINGERS IV SOLN
INTRAVENOUS | Status: DC | PRN
  Administered 2011-11-22: 11:00:00 via INTRAVENOUS

## 2011-11-22 MED ORDER — HYDROMORPHONE HCL PF 1 MG/ML IJ SOLN
INTRAMUSCULAR | Status: AC
  Filled 2011-11-22: qty 1

## 2011-11-22 MED ORDER — ONDANSETRON HCL 4 MG/2ML IJ SOLN: 4.0000 mg | Freq: Once | INTRAMUSCULAR | Status: DC | PRN

## 2011-11-22 MED ORDER — BUPIVACAINE-EPINEPHRINE 0.25% -1:200000 IJ SOLN
INTRAMUSCULAR | Status: DC | PRN
  Administered 2011-11-22: 16 mL

## 2011-11-22 MED ORDER — ONDANSETRON HCL 4 MG/2ML IJ SOLN
INTRAMUSCULAR | Status: DC | PRN
  Administered 2011-11-22: 4 mg via INTRAVENOUS

## 2011-11-22 MED ORDER — FENTANYL CITRATE 0.05 MG/ML IJ SOLN
INTRAMUSCULAR | Status: DC | PRN
  Administered 2011-11-22: 50 ug via INTRAVENOUS
  Administered 2011-11-22: 25 ug via INTRAVENOUS

## 2011-11-22 MED ORDER — MIDAZOLAM HCL 5 MG/5ML IJ SOLN
INTRAMUSCULAR | Status: DC | PRN
  Administered 2011-11-22: 2 mg via INTRAVENOUS

## 2011-11-22 MED ORDER — HYDROMORPHONE HCL PF 1 MG/ML IJ SOLN
0.2500 mg | INTRAMUSCULAR | Status: DC | PRN
  Administered 2011-11-22: 0.25 mg via INTRAVENOUS
  Administered 2011-11-22: 0.5 mg via INTRAVENOUS
  Administered 2011-11-22: 0.25 mg via INTRAVENOUS

## 2011-11-22 MED ORDER — PROPOFOL 10 MG/ML IV BOLUS
INTRAVENOUS | Status: DC | PRN
  Administered 2011-11-22: 50 mg via INTRAVENOUS
  Administered 2011-11-22: 150 mg via INTRAVENOUS

## 2011-11-22 MED ORDER — OXYCODONE-ACETAMINOPHEN 5-325 MG PO TABS
ORAL_TABLET | ORAL | Status: AC
  Filled 2011-11-22: qty 1

## 2011-11-22 SURGICAL SUPPLY — 58 items
ADH SKN CLS APL DERMABOND .7 (GAUZE/BANDAGES/DRESSINGS) ×1
APL SKNCLS STERI-STRIP NONHPOA (GAUZE/BANDAGES/DRESSINGS) ×1
APPLIER CLIP 9.375 MED OPEN (MISCELLANEOUS) ×2
APR CLP MED 9.3 20 MLT OPN (MISCELLANEOUS) ×1
BENZOIN TINCTURE PRP APPL 2/3 (GAUZE/BANDAGES/DRESSINGS) ×1 IMPLANT
BINDER BREAST XLRG (GAUZE/BANDAGES/DRESSINGS) ×1 IMPLANT
BLADE SURG 10 STRL SS (BLADE) ×2 IMPLANT
BLADE SURG 15 STRL LF DISP TIS (BLADE) ×1 IMPLANT
BLADE SURG 15 STRL SS (BLADE) ×2
CANISTER SUCTION 2500CC (MISCELLANEOUS) ×2 IMPLANT
CHLORAPREP W/TINT 26ML (MISCELLANEOUS) ×2 IMPLANT
CLIP APPLIE 9.375 MED OPEN (MISCELLANEOUS) IMPLANT
CLOTH BEACON ORANGE TIMEOUT ST (SAFETY) ×2 IMPLANT
CLSR STERI-STRIP ANTIMIC 1/2X4 (GAUZE/BANDAGES/DRESSINGS) ×1 IMPLANT
COVER PROBE W GEL 5X96 (DRAPES) IMPLANT
COVER SURGICAL LIGHT HANDLE (MISCELLANEOUS) ×2 IMPLANT
DERMABOND ADVANCED (GAUZE/BANDAGES/DRESSINGS) ×1
DERMABOND ADVANCED .7 DNX12 (GAUZE/BANDAGES/DRESSINGS) ×1 IMPLANT
DEVICE DUBIN SPECIMEN MAMMOGRA (MISCELLANEOUS) ×2 IMPLANT
DRAPE LAPAROSCOPIC ABDOMINAL (DRAPES) ×2 IMPLANT
DRSG TEGADERM 4X4.75 (GAUZE/BANDAGES/DRESSINGS) ×1 IMPLANT
ELECT BLADE 4.0 EZ CLEAN MEGAD (MISCELLANEOUS) ×2
ELECT CAUTERY BLADE 6.4 (BLADE) ×2 IMPLANT
ELECT REM PT RETURN 9FT ADLT (ELECTROSURGICAL) ×2
ELECTRODE BLDE 4.0 EZ CLN MEGD (MISCELLANEOUS) IMPLANT
ELECTRODE REM PT RTRN 9FT ADLT (ELECTROSURGICAL) ×1 IMPLANT
GLOVE BIO SURGEON STRL SZ7 (GLOVE) ×2 IMPLANT
GLOVE BIOGEL PI IND STRL 7.5 (GLOVE) ×1 IMPLANT
GLOVE BIOGEL PI INDICATOR 7.5 (GLOVE) ×1
GOWN STRL NON-REIN LRG LVL3 (GOWN DISPOSABLE) ×4 IMPLANT
KIT BASIN OR (CUSTOM PROCEDURE TRAY) ×2 IMPLANT
KIT MARKER MARGIN INK (KITS) ×1 IMPLANT
KIT ROOM TURNOVER OR (KITS) ×2 IMPLANT
NDL 18GX1X1/2 (RX/OR ONLY) (NEEDLE) IMPLANT
NDL HYPO 25GX1X1/2 BEV (NEEDLE) ×1 IMPLANT
NEEDLE 18GX1X1/2 (RX/OR ONLY) (NEEDLE) IMPLANT
NEEDLE HYPO 25GX1X1/2 BEV (NEEDLE) ×2 IMPLANT
NS IRRIG 1000ML POUR BTL (IV SOLUTION) ×2 IMPLANT
PACK SURGICAL SETUP 50X90 (CUSTOM PROCEDURE TRAY) ×2 IMPLANT
PAD ARMBOARD 7.5X6 YLW CONV (MISCELLANEOUS) ×2 IMPLANT
PENCIL BUTTON HOLSTER BLD 10FT (ELECTRODE) ×2 IMPLANT
SPONGE GAUZE 4X4 12PLY (GAUZE/BANDAGES/DRESSINGS) ×1 IMPLANT
SPONGE LAP 18X18 X RAY DECT (DISPOSABLE) ×2 IMPLANT
STAPLER VISISTAT 35W (STAPLE) ×2 IMPLANT
SUT MNCRL AB 4-0 PS2 18 (SUTURE) ×2 IMPLANT
SUT SILK 2 0 (SUTURE) ×2
SUT SILK 2 0 SH (SUTURE) IMPLANT
SUT SILK 2-0 18XBRD TIE 12 (SUTURE) IMPLANT
SUT VIC AB 2-0 SH 27 (SUTURE) ×4
SUT VIC AB 2-0 SH 27XBRD (SUTURE) ×1 IMPLANT
SUT VIC AB 3-0 SH 27 (SUTURE) ×2
SUT VIC AB 3-0 SH 27X BRD (SUTURE) ×1 IMPLANT
SYR BULB 3OZ (MISCELLANEOUS) ×2 IMPLANT
SYR CONTROL 10ML LL (SYRINGE) ×2 IMPLANT
TOWEL OR 17X24 6PK STRL BLUE (TOWEL DISPOSABLE) ×2 IMPLANT
TOWEL OR 17X26 10 PK STRL BLUE (TOWEL DISPOSABLE) ×2 IMPLANT
TUBE CONNECTING 12X1/4 (SUCTIONS) ×2 IMPLANT
YANKAUER SUCT BULB TIP NO VENT (SUCTIONS) ×2 IMPLANT

## 2011-11-22 NOTE — Transfer of Care (Signed)
Immediate Anesthesia Transfer of Care Note  Patient: [name]  Procedure(s) Performed: Procedure(s) (LRB): BREAST LUMPECTOMY WITH NEEDLE LOCALIZATION (Left)  Patient Location: PACU  Anesthesia Type: General  Level of Consciousness: awake, alert , oriented and patient cooperative  Airway & Oxygen Therapy: Patient Spontanous Breathing and Patient connected to nasal cannula oxygen  Post-op Assessment: Report given to PACU RN, Post -op Vital signs reviewed and stable and Patient moving all extremities  Post vital signs: Reviewed and stable  Complications: No apparent anesthesia complications

## 2011-11-22 NOTE — Interval H&P Note (Signed)
History and Physical Interval Note:  11/22/2011 11:05 AM I discussed wire with Dr. Mayford Knife.  She could not identify area on u/s and did mammographic localization for which she is confident correct area has been localized.  There was no clip due to her prior hematoma.   [name]  has presented today for surgery, with the diagnosis of left breast cancer  The various methods of treatment have been discussed with the patient and family. After consideration of risks, benefits and other options for treatment, the patient has consented to  Procedure(s) (LRB): BREAST LUMPECTOMY WITH NEEDLE LOCALIZATION (Left) as a surgical intervention .  The patient's history has been reviewed, patient examined, no change in status, stable for surgery.  I have reviewed the patients' chart and labs.  Questions were answered to the patient's satisfaction.     [name]

## 2011-11-22 NOTE — Anesthesia Procedure Notes (Signed)
Procedure Name: LMA Insertion Date/Time: 11/22/2011 11:47 AM Performed by: Jerilee Hoh Pre-anesthesia Checklist: Patient identified, Emergency Drugs available, Suction available and Patient being monitored Patient Re-evaluated:Patient Re-evaluated prior to inductionOxygen Delivery Method: Circle system utilized Preoxygenation: Pre-oxygenation with 100% oxygen Intubation Type: IV induction LMA: LMA inserted LMA Size: 4.0 Tube type: Oral Number of attempts: 1 Placement Confirmation: positive ETCO2 and breath sounds checked- equal and bilateral Tube secured with: Tape Dental Injury: Teeth and Oropharynx as per pre-operative assessment

## 2011-11-22 NOTE — Op Note (Signed)
Preoperative diagnosis: Clinical stage I left breast cancer Postoperative diagnosis: Same as above Procedure: Left breast wire-guided lumpectomy Surgeon: Dr. Harden Mo Anesthesia: Gen. Specimens: Left breast tissue marked with paint Complications: None Drains: None Sponge and needle count correct at end of operation Disposition to recovery in stable condition  Indications: This is an 78 year old female with multiple medical problems who is just been recently diagnosed with polycythemia. On a routine screening mammogram she was also noted to have a left breast mass. This underwent biopsy showed invasive ductal carcinoma. She was then referred to see me for evaluation. We discussed all of her options. We decided in conjunction with her medical oncologist to proceed with a left breast lumpectomy. We decided not to pursue sentinel lymph node biopsies would not change her therapy in this patient with multiple comorbidities as well as a very estrogen and progesterone positive tumor. Also discussed with her medical doctor wanted me to do this on her aspirin due to the fact that she had a TIA off of her aspirin. We discussed the risks and benefits of surgery.  Procedure: Prior to surgery I discussed with Dr. Mayford Knife the placement of the wire. She had a lot of trouble finding this lesion by ultrasound and eventually was able to find what she thought was the lesion on mammography. There was a clip placed initially due to hematoma. She feels confident that she localized the lesion with a 14 cm wire. I had these mammograms available for my review. She was then brought to the operating room. After informed consent was obtained she was then taken to the operating room and placed under anesthesia. She had sequential compression devices on her legs. She was then prepped and draped in the standard sterile surgical fashion. Surgical timeout and performed.  I made a fairly large curvilinear incision in the left  upper outer quadrant due to the wire tract and was very difficult to identify where the actual lesion wasn't . I then used cautery to excise the wire and the surrounding tissue. I actually could feel the lesion once I got into the breast tissue near the pectoralis muscle. I removed a good margin around this including the pectoralis fashion and her deep margin is her pectoralis muscle. I then did a mammogram which did show that it appeared I removed the area. This was confirmed by Dr. Mayford Knife. I then took some time to obtain hemostasis due to the fact that she is on aspirin. This all looked clean upon completion. I then irrigated copiously. I then placed 2 clips in the deep position superiorly. I placed 3 clips together inferiorly at the site of where the tumor was right near her pectoralis muscle. There is a single clip in each position in the cavity. I then closed the breast tissue a 2-0 Vicryl. A close dermis and 3-0 Vicryl and the skin with 4-0 Monocryl. I infiltrated 20 cc of quarter percent Marcaine. I then placed Steri-Strips overlying this and a sterile dressing. She tolerated this well was extubated and transferred recovery stable.

## 2011-11-22 NOTE — Anesthesia Preprocedure Evaluation (Addendum)
Anesthesia Evaluation  Patient identified by MRN, date of birth, ID band Patient awake    Reviewed: Allergy & Precautions, H&P , NPO status , Patient's Chart, lab work & pertinent test results, reviewed documented beta blocker date and time   Airway Mallampati: II TM Distance: >3 FB Neck ROM: Full    Dental  (+) Teeth Intact and Dental Advisory Given   Pulmonary sleep apnea ,          Cardiovascular hypertension, + CAD     Neuro/Psych  Headaches, Anxiety Depression CVA, Residual Symptoms    GI/Hepatic hiatal hernia, GERD-  ,  Endo/Other    Renal/GU      Musculoskeletal   Abdominal   Peds  Hematology   Anesthesia Other Findings   Reproductive/Obstetrics                          Anesthesia Physical Anesthesia Plan  ASA: III  Anesthesia Plan: General   Post-op Pain Management:    Induction: Intravenous  Airway Management Planned: LMA  Additional Equipment:   Intra-op Plan:   Post-operative Plan: Extubation in OR  Informed Consent: I have reviewed the patients History and Physical, chart, labs and discussed the procedure including the risks, benefits and alternatives for the proposed anesthesia with the patient or authorized representative who has indicated his/her understanding and acceptance.   Dental advisory given  Plan Discussed with: Surgeon and CRNA  Anesthesia Plan Comments:        Anesthesia Quick Evaluation

## 2011-11-22 NOTE — H&P (View-Only) (Signed)
Patient ID: [name], female   DOB: 08/26/1929, 78 y.o.   MRN: [id number]  Chief Complaint  Patient presents with  . Pre-op Exam    eval Lt br ca    HPI [name] is a 78 y.o. female.  Referred by Dr. Green HPI This is an 78-year-old female with multiple medical problems including recently diagnosed polycythemia as well as transient ischemic attack off aspirin in the last couple of months.She otherwise is very active and doing well. She also underwent a screening mammogram for the first time in 2 years. She had no breast complaints prior to this. She does have a significant family history her mom and her sister her breast cancer.She is screening mammogram with a left breast abnormality. This underwent a biopsy showing an invasive ductal carcinoma that is 100% estrogen and progesterone receptor positive and HER-2/neu negative. She had no other abnormalities on MRI including no lymphadenopathy and no right breast abnormalities and shows only post-biopsy change with a 9 mm oval nodular area of enhancement. She comes in today to discuss her options. Past Medical History  Diagnosis Date  . Headache     migraines  . Hyperlipidemia   . Osteoporosis   . CVA (cerebral infarction)   . Hemorrhoids   . Hyperplastic colon polyp 06/2001  . Diverticulitis   . Varicose vein   . DVT (deep venous thrombosis)     after venous ablation, R leg  . Fracture 09/10/07    L2, status post vertebroplasty of L2 performed by IR  . Cardiomyopathy     Probable Takotsubo, severe CP w/ normal cath in 1994. Severe CP in 2003 w/ widespread T wave inversions on ECG. Cath w/ minimal coronary disease but LV-gram showed periapical severe hypokinesis and basilar hyperkinesia (EF 40%). Last echo in 4/09 confirmed full LV functional recovery with EF 60%, no regional wall motion abnormalities, mild to moderate LVH.  . OSA (obstructive sleep apnea) 03-29-11    no cpap used, not a problem now.  . CAD (coronary artery  disease)     mild nonobstructive disease on cath in 2003  . Hypertension   . GERD (gastroesophageal reflux disease)   . Hiatal hernia 03-29-11    no nerve problems  . Depression with anxiety 03-29-11    lost husband 3'09  . OA (osteoarthritis) of knee 03-29-11    w/ bilateral knee pain-not a problem now  . Stroke 03-29-11    CVA x2 -last 10'12-?TIA(visual problems)  . E. coli gastroenteritis 03-29-11    8'10  . Cancer     Past Surgical History  Procedure Date  . Appendectomy   . Ovarian cyst surgery     left  . Tonsillectomy   . Back surgery   . Tubal ligation   . Dental extraction     L maxillary molar  . Kyphosis surgery 08/2007    cement used  . Total knee arthroplasty 06/2010    left  . Cholecystectomy 04/09/2011    Procedure: LAPAROSCOPIC CHOLECYSTECTOMY WITH INTRAOPERATIVE CHOLANGIOGRAM;  Surgeon: Todd J Rosenbower, MD;  Location: [facility];  Service: General;  Laterality: N/A;    Family History  Problem Relation Age of Onset  . Colon polyps      cousin  . Colon cancer      cousin  . Breast cancer Mother   . Breast cancer Sister   . Heart disease Father     MIs  . Leukemia Brother     GM      Social History History  Substance Use Topics  . Smoking status: Former Smoker    Quit date: 05/14/1988  . Smokeless tobacco: Never Used  . Alcohol Use: 0.6 oz/week    1 Cans of beer per week    Allergies  Allergen Reactions  . Atorvastatin Diarrhea    REACTION: diarrhe  . Ezetimibe     REACTION: nausea  . Fluvastatin Sodium Other (See Comments)    "Can't remember"  . Magnesium Hydroxide Other (See Comments)    REACTION: triggers HAs  . Penicillins Other (See Comments)    Mouth blisters 03-29-11  . Quinapril Hcl Other (See Comments)    03-29-11 "feelings of tiredness"  . Rosuvastatin Other (See Comments)    REACTION: jaw pain  . Simvastatin Diarrhea  . Topamax (Topiramate) Itching  . Valsartan Itching and Other (See Comments)    REACTION: toes itch     Current Outpatient Prescriptions  Medication Sig Dispense Refill  . acetaminophen-codeine (TYLENOL #3) 300-30 MG per tablet TAKE 1 TABLET BY MOUTH EVERY 8 HOURS AS NEEDED FOR MIGRAINE  90 tablet  0  . amLODipine (NORVASC) 5 MG tablet Take 5 mg by mouth daily.      . aspirin 325 MG tablet Take 325 mg by mouth daily.      . Calcium Carbonate Antacid (TUMS ULTRA 1000 PO) Take 2 tablets by mouth daily.       . Coenzyme Q10 200 MG capsule Take 1 capsule (200 mg total) by mouth daily.      . docusate sodium (COLACE) 100 MG capsule Take 100 mg by mouth daily as needed. For constipation      . furosemide (LASIX) 20 MG tablet Take 20 mg by mouth daily as needed. For fluid retention      . LORazepam (ATIVAN) 0.5 MG tablet TAKE 1 TABLET BY MOUTH EVERY DAY AS NEEDED FOR ANXIETY  90 tablet  0  . metoprolol (LOPRESSOR) 50 MG tablet Take 0.5 tablets (25 mg total) by mouth 2 (two) times daily.  30 tablet  6  . omeprazole (PRILOSEC) 40 MG capsule Take 40 mg by mouth daily.        . rosuvastatin (CRESTOR) 5 MG tablet Take 2.5 mg by mouth daily. Take one every other day      . vitamin E 400 UNIT capsule Take 400 Units by mouth daily.         Review of Systems Review of Systems  Blood pressure 142/92, pulse 103, temperature 97.8 F (36.6 C), temperature source Temporal, height 5' 7" (1.702 m), weight 176 lb 3.2 oz (79.924 kg), SpO2 97.00%.  Physical Exam Physical Exam  Vitals reviewed. Constitutional: She appears well-developed and well-nourished.  Neck: Neck supple.  Cardiovascular: Normal rate, regular rhythm and normal heart sounds.   Pulmonary/Chest: Effort normal and breath sounds normal. She has no wheezes. She has no rales. Right breast exhibits no inverted nipple, no mass, no nipple discharge, no skin change and no tenderness. Left breast exhibits no inverted nipple, no mass, no nipple discharge, no skin change and no tenderness. Breasts are symmetrical.    Lymphadenopathy:    She has no  cervical adenopathy.    She has no axillary adenopathy.       Right: No supraclavicular adenopathy present.       Left: No supraclavicular adenopathy present.    Data Reviewed BILATERAL BREAST MRI WITH AND WITHOUT CONTRAST  Technique: Multiplanar, multisequence MR images of both breasts  were obtained prior to   and following the intravenous administration  of 16ml of Multihance. Three dimensional images were evaluated at  the independent DynaCad workstation.  Comparison: Prior mammograms and ultrasound  Findings: Minimal T2 hyperintense stranding is noted in the left  lateral breast at the presumed site of prior biopsy. No clip  artifact is present because a clip was not placed. No other area  of abnormal T2-weighted hyperintensity in either breast. No  axillary or internal mammary artery chain lymphadenopathy.  Presumed hiatal hernia noted within the posterior mediastinum,  incompletely imaged at the inferior aspect of the field of view.  On postcontrast images, there is smooth rim enhancement at the left  breast three o'clock location posterior depth biopsy cavity. On  the initial postcontrast series, there is a 9 mm oval nodular area  of enhancement at the inferior aspect of the left breast three  o'clock location biopsy cavity, demonstrating washin/washout type  enhancement kinetics. This is felt to most likely correspond to a  portion of the previously sampled mass. There is motion artifact  on the second and third post contrast series. No other area of  abnormal enhancement is seen in either breast to suggest  multifocal/multicentric or contralateral malignancy.  IMPRESSION:  Post biopsy change in the left breast three o'clock location with 9  mm oval nodular area of enhancement at its inferior margin, likely  corresponding to the previously biopsied mass. No other area of  abnormal enhancement is seen in either breast to suggest  multifocal/multicentric or contralateral  malignancy.   Assessment    Left breast cancer, clinical stage I    Plan    Left breast wire guided lumpectomy   We discussed the staging and pathophysiology of breast cancer. We discussed all of the different options for treatment for breast cancer including surgery, chemotherapy, radiation therapy, Herceptin, and antiestrogen therapy.   We discussed a sentinel lymph node biopsy and I also discussed with Dr.  Ennever.  Both of us agree omitting the sentinel node is reasonable given age, er/pr status and her other comorbidities. We discussed the options for treatment of the breast cancer which included lumpectomy versus a mastectomy. We discussed the performance of the lumpectomy with a wire placement. We discussed a 10-20% chance of a positive margin requiring reexcision in the operating room. We also discussed that she may need radiation therapy or antiestrogen therapy or both if she undergoes lumpectomy. We discussed the mastectomy and the postoperative care for that as well. We discussed that there is no difference in her survival whether she undergoes lumpectomy with radiation therapy or antiestrogen therapy versus a mastectomy. There is a slight difference in the local recurrence rate being 3-5% with lumpectomy and about 1% with a mastectomy. I will plan on leaving on aspirin after discussing with Dr. Paz.  There is small risk of tia/stroke with surgery.  Will also touch base with cardiology although I think she is ok as she tolerated lap chole last year and is doing well. We discussed the risks of operation including bleeding, infection, possible reoperation. She understands her further therapy will be based on what her stages at the time of her operation.         Roselin Wiemann 11/20/2011, 3:30 PM    

## 2011-11-22 NOTE — Anesthesia Postprocedure Evaluation (Signed)
Anesthesia Post Note  Patient: [name]  Procedure(s) Performed: Procedure(s) (LRB): BREAST LUMPECTOMY WITH NEEDLE LOCALIZATION (Left)  Anesthesia type: general  Patient location: PACU  Post pain: Pain level controlled  Post assessment: Patient's Cardiovascular Status Stable  Last Vitals:  Filed Vitals:   11/22/11 1306  BP: 150/66  Pulse: 68  Temp:   Resp: 21    Post vital signs: Reviewed and stable  Level of consciousness: sedated  Complications: No apparent anesthesia complications

## 2011-11-22 NOTE — Progress Notes (Signed)
Pt. Takes metoprolol 1/2 tablet 2 times a day . She stated she took a whole table last night so she wouldn't have to take one this am. Notified Dr. Michelle Piper, stated not to give her one this morning since she took it last night.

## 2011-11-23 NOTE — Telephone Encounter (Signed)
Done

## 2011-11-23 NOTE — Telephone Encounter (Signed)
Pt called stating that she usually only takes med qhs but sometime she does have to take med bid.

## 2011-11-27 ENCOUNTER — Other Ambulatory Visit: Payer: Self-pay | Admitting: Internal Medicine

## 2011-11-27 ENCOUNTER — Telehealth: Payer: Self-pay | Admitting: *Deleted

## 2011-11-27 MED ORDER — LORAZEPAM 0.5 MG PO TABS: ORAL_TABLET | ORAL | Status: DC

## 2011-11-27 NOTE — Telephone Encounter (Signed)
LORAZEPAM 0.5 MG TABLETS QTY:90 REFILL:11/23/11 TAKE 1 TABLET BY MOUTH EVERY DAY AS NEEDED FOR ANXIETY

## 2011-11-27 NOTE — Telephone Encounter (Signed)
That is correct, please change sig to 3 times a day

## 2011-11-27 NOTE — Telephone Encounter (Signed)
Pt has informed pharmacy that she is to take LORazepam (ATIVAN) 0.5 MG tablet tid instead of qd prn. .Please advise on sig

## 2011-11-27 NOTE — Telephone Encounter (Signed)
Discuss with pharmacy

## 2011-11-27 NOTE — Telephone Encounter (Signed)
Rx re faxed to pharmacy

## 2011-11-28 ENCOUNTER — Telehealth (INDEPENDENT_AMBULATORY_CARE_PROVIDER_SITE_OTHER): Payer: Self-pay

## 2011-11-28 NOTE — Telephone Encounter (Signed)
LMOM at home notifying pt of her f/u appt with Dr Dwain Sarna for 8/8.

## 2011-11-30 ENCOUNTER — Ambulatory Visit (HOSPITAL_BASED_OUTPATIENT_CLINIC_OR_DEPARTMENT_OTHER): Payer: Medicare Other

## 2011-11-30 ENCOUNTER — Ambulatory Visit (HOSPITAL_BASED_OUTPATIENT_CLINIC_OR_DEPARTMENT_OTHER): Payer: Medicare Other | Admitting: Hematology & Oncology

## 2011-11-30 ENCOUNTER — Other Ambulatory Visit (HOSPITAL_BASED_OUTPATIENT_CLINIC_OR_DEPARTMENT_OTHER): Payer: Medicare Other | Admitting: Lab

## 2011-11-30 VITALS — BP 148/76 | HR 66 | Temp 97.4°F | Ht 67.0 in | Wt 176.0 lb

## 2011-11-30 VITALS — BP 137/70 | HR 68

## 2011-11-30 DIAGNOSIS — D473 Essential (hemorrhagic) thrombocythemia: Secondary | ICD-10-CM

## 2011-11-30 DIAGNOSIS — D75839 Thrombocytosis, unspecified: Secondary | ICD-10-CM

## 2011-11-30 DIAGNOSIS — D45 Polycythemia vera: Secondary | ICD-10-CM

## 2011-11-30 DIAGNOSIS — D510 Vitamin B12 deficiency anemia due to intrinsic factor deficiency: Secondary | ICD-10-CM

## 2011-11-30 DIAGNOSIS — D509 Iron deficiency anemia, unspecified: Secondary | ICD-10-CM

## 2011-11-30 DIAGNOSIS — R718 Other abnormality of red blood cells: Secondary | ICD-10-CM

## 2011-11-30 DIAGNOSIS — C50919 Malignant neoplasm of unspecified site of unspecified female breast: Secondary | ICD-10-CM | POA: Diagnosis not present

## 2011-11-30 DIAGNOSIS — D680 Von Willebrand's disease: Secondary | ICD-10-CM

## 2011-11-30 LAB — CBC WITH DIFFERENTIAL (CANCER CENTER ONLY)
BASO#: 0 10*3/uL (ref 0.0–0.2)
BASO%: 0.5 % (ref 0.0–2.0)
EOS%: 7.2 % — ABNORMAL HIGH (ref 0.0–7.0)
Eosinophils Absolute: 0.6 10*3/uL — ABNORMAL HIGH (ref 0.0–0.5)
HCT: 49.8 % — ABNORMAL HIGH (ref 34.8–46.6)
HGB: 16.1 g/dL — ABNORMAL HIGH (ref 11.6–15.9)
LYMPH#: 1 10*3/uL (ref 0.9–3.3)
LYMPH%: 12.8 % — ABNORMAL LOW (ref 14.0–48.0)
MCH: 24.9 pg — ABNORMAL LOW (ref 26.0–34.0)
MCHC: 32.3 g/dL (ref 32.0–36.0)
MCV: 77 fL — ABNORMAL LOW (ref 81–101)
MONO#: 0.9 10*3/uL (ref 0.1–0.9)
MONO%: 11.1 % (ref 0.0–13.0)
NEUT#: 5.3 10*3/uL (ref 1.5–6.5)
NEUT%: 68.4 % (ref 39.6–80.0)
Platelets: 568 10*3/uL — ABNORMAL HIGH (ref 145–400)
RBC: 6.46 10*6/uL — ABNORMAL HIGH (ref 3.70–5.32)
RDW: 18.1 % — ABNORMAL HIGH (ref 11.1–15.7)
WBC: 7.7 10*3/uL (ref 3.9–10.0)

## 2011-11-30 LAB — CHCC SATELLITE - SMEAR

## 2011-11-30 MED ORDER — PROCHLORPERAZINE MALEATE 10 MG PO TABS: 5.0000 mg | ORAL_TABLET | Freq: Four times a day (QID) | ORAL | Status: DC | PRN

## 2011-11-30 MED ORDER — HYDROXYUREA 500 MG PO CAPS: 500.0000 mg | ORAL_CAPSULE | Freq: Every day | ORAL | Status: AC

## 2011-11-30 MED ORDER — LETROZOLE 2.5 MG PO TABS: 2.5000 mg | ORAL_TABLET | Freq: Every day | ORAL | Status: DC

## 2011-11-30 NOTE — Progress Notes (Signed)
[name] presents today for phlebotomy per MD orders. Phlebotomy procedure started at 1100 and ended at 1115. Approximately 500 mls removed. Patient observed for 30 minutes after procedure without any incident. Patient tolerated procedure well. IV needle removed intact.

## 2011-11-30 NOTE — Progress Notes (Signed)
This office note has been dictated.

## 2011-11-30 NOTE — Patient Instructions (Signed)
Therapeutic Phlebotomy Care After Refer to this sheet in the next few weeks. These instructions provide you with information on caring for yourself after your procedure. Your caregiver may also give you more specific instructions. Your treatment has been planned according to current medical practices, but problems sometimes occur. Call your caregiver if you have any problems or questions after your procedure. HOME CARE INSTRUCTIONS Most people can go back to their normal activities right away. Before you leave, be sure to ask if there is anything you should or should not do. In general, it would be wise to:  Keep the bandage dry. You can remove the bandage after about 5 hours.   Eat well-balanced meals for the next 24 hours.   Drink enough fluids to keep your urine clear or pale yellow.   Avoid drinking alcohol minimally until after eating.   Avoid smoking for at least 30 minutes after the procedure.   Avoid strenous physical activity or heavy lifting or pulling for about 5 hours after the procedure.   Athletes should avoid strenous exercise for 12 hours or more.   Change positions slowly for the remainder of the day to prevent lightheadedness or fainting.   If you feel lightheaded, lie down until the feeling subsides.   If you have bleeding from the needle insertion site, elevate your arm and press firmly on the site until the bleeding stops.   If bruising or bleeding appears under the skin, apply ice to the area for 15 to 20 minutes, 3 to 4 times per day. Put the ice in a plastic bag and place a towel between the bag of ice and your skin. Do this while you are awake for the first 24 hours. The ice packs can be stopped before 24 hours if the swelling goes away. If swelling persists after 24 hours, a warm, moist washcloth can be applied to the area for 15 to 20 minutes, 3 to 4 times per day. The warm, moist treatments can be stopped when the swelling goes away.   It is important to  continue further therapeutic phlebotomy as directed by your caregiver.  SEEK MEDICAL CARE IF:  There is bleeding or fluid leaking from the needle insertion site.   The needle insertion site becomes swollen, red, or sore.   You feel lightheaded, dizzy or nauseated, and the feeling does not go away.   You notice new bruising at the needle insertion site.   You feel more weak or tired than normal.   You develop a fever.  SEEK IMMEDIATE MEDICAL CARE IF:   There is increased bleeding, pain, or swelling from the needle insertion site.   You have severe nausea or vomiting.   You have chest pain.   You have trouble breathing.  MAKE SURE YOU:  Understand these instructions.   Will watch your condition.   Will get help right away if you are not doing well or get worse.  Document Released: 10/02/2010 Document Revised: 04/19/2011 Document Reviewed: 10/02/2010 [employer] Patient Information 2012 [employer]. 

## 2011-11-30 NOTE — Addendum Note (Signed)
Addended by: Lacie Draft on: 11/30/2011 11:49 AM   Modules accepted: Orders

## 2011-11-30 NOTE — Progress Notes (Signed)
CC:   Dawn Gosling, MD Dawn Ora, MD  DIAGNOSES: 1. Polycythemia vera, JAK2 positive. 2. Stage I (T1b N0 M0) ductal carcinoma of the left breast.  CURRENT THERAPY: 1. The patient to start Hydrea 500 mg p.o. daily. 2. Femara 2.5 mg p.o. daily. 3. Phlebotomy, if needed, to maintain hematocrit below 45%.  INTERIM HISTORY:  [name] comes in for a second office visit. Since we initially saw her she has had an incredible history.  She had a bone marrow biopsy done.  I did this to help with her bone marrow issues.  We did a bone marrow biopsy on July 2.  The pathology report ([id number]) showed a hypercellular marrow with megakaryocytic clustering.  There was no evidence of leukemia.  There was mild reticulin fibrosis.  Cytogenetics are pending.  Iron stores were absent.  She is JAK2 positive.  As such, I felt that the whole clinical picture was consistent with polycythemia vera.  She then was found to have a stage I breast cancer.  She underwent a lumpectomy on July 11.  The path report ([id number]) showed a 0.8 cm invasive ductal carcinoma.  There was no lymphovascular space invasion. Margins were negative.  The tumor was ER positive, PR positive and HER-2 negative.  There was no indication for sentinel node biopsy.  She was not felt to be needing radiation therapy.  Again, she has 2 totally separate problems now.  Each one is totally independent of the other.  She feels okay.  She maybe is a little bit tired.  PHYSICAL EXAMINATION:  General:  This is an elderly well-nourished white female in no obvious distress.  Vital signs: 97.4, pulse 66, respiratory rate 20, blood pressure 148/76.  Weight is 176.  Head and neck:  Shows a normocephalic, atraumatic skull.  There are no ocular or oral lesions. There are no palpable cervical or supraclavicular lymph nodes.  Lungs: Clear bilaterally.  Cardiac:  Regular rate and rhythm with a normal S1 and S2.  There are no  murmurs, rubs or bruits.  Abdomen:  Soft with good bowel sounds.  There was no palpable abdominal mass.  There was no palpable hepatosplenomegaly.  Breasts:  Shows right breast with no masses, edema or erythema.  There was no right axillary adenopathy. Left breast shows the lumpectomy that is healing.  This was at about the 2 o'clock position.  There was no distinct mass in the left breast. There was no left axillary adenopathy.  Back:  Shows no kyphosis or osteoporotic changes.  Extremities:  Show no clubbing, cyanosis or edema.  Skin:  No rashes, ecchymoses or petechiae.  LABORATORIES STUDIES:  White cell count 7.7, hemoglobin 16.1, hematocrit 15, platelet count is 568.  MCV is 77.  IMPRESSION:  [name] is an 78 year old white female with 2 separate oncologic issues now.  She does have the stage I breast cancer.  There is early stage breast cancer.  From my point of view she has excellent prognostic markers.  I agree that a sentinel node biopsy would not be indicated.  I also understand that given her age, postop radiation therapy probably would have little added benefit to what surgery has already accomplished.  She will need Femara.  I do not see any problems with her being on Femara.  I do not see any contraindication to her being on Femara.  I did tell her to make sure she does take vitamin D.  I think this is something that is very  important.  I think the more "active issue" would be the polycythemia.  She definitely has a hyperactive bone marrow.  She needs to be managed somewhat aggressively.  We will go ahead and phlebotomize her today. She already takes an aspirin.  I told her to cut that aspirin dose in half and take two 81 mg aspirin a day.  I want to get her on Hydrea.  I believe we need to get her on Hydrea to try to get her platelet count down and also this might help with her leukocytosis.  I spent a good 45 minutes or so with [name] and her daughter.   I answered all their questions.  I explained to [name] that the polycythemia is not curable but our goal is to make sure that she does not have any complications with it.  I do want to get her back in 1 month to see me.  We can make adjustments to the Hydrea dose depending on her blood counts.    ______________________________ Josph Macho, M.D. PRE/MEDQ  D:  11/30/2011  T:  11/30/2011  Job:  [id number]

## 2011-12-06 ENCOUNTER — Other Ambulatory Visit: Payer: Self-pay | Admitting: Hematology & Oncology

## 2011-12-12 ENCOUNTER — Ambulatory Visit (INDEPENDENT_AMBULATORY_CARE_PROVIDER_SITE_OTHER): Payer: Medicare Other | Admitting: Internal Medicine

## 2011-12-12 VITALS — BP 134/84 | HR 66 | Temp 97.7°F | Wt 177.0 lb

## 2011-12-12 DIAGNOSIS — C50919 Malignant neoplasm of unspecified site of unspecified female breast: Secondary | ICD-10-CM | POA: Diagnosis not present

## 2011-12-12 DIAGNOSIS — I2510 Atherosclerotic heart disease of native coronary artery without angina pectoris: Secondary | ICD-10-CM

## 2011-12-12 DIAGNOSIS — D45 Polycythemia vera: Secondary | ICD-10-CM

## 2011-12-12 DIAGNOSIS — F341 Dysthymic disorder: Secondary | ICD-10-CM

## 2011-12-12 DIAGNOSIS — D751 Secondary polycythemia: Secondary | ICD-10-CM

## 2011-12-12 MED ORDER — LORAZEPAM 0.5 MG PO TABS: ORAL_TABLET | ORAL | Status: DC

## 2011-12-12 NOTE — Assessment & Plan Note (Signed)
Patient was eventually diagnosed with Polycythemia vera, JAK2 positive. Currently treatment is hydroxyurea. Closely follow up by hematology

## 2011-12-12 NOTE — Assessment & Plan Note (Signed)
Status post lumpectomy left breast 11-2011. Subsequently she was seen by oncology, the only additional  treatment needed at this point is Femara.

## 2011-12-12 NOTE — Progress Notes (Signed)
  Subjective:    Patient ID: [name], female    DOB: [birth year], 78 y.o.   MRN: [id number]  HPI Checkup from previous visit. Since the last time she was here, she had a lumpectomy for breast cancer, hematology notes reviewed, see assessment and plan Was complaining of chest pain, symptoms actually resolved. Anxiety, needs a refill, see assessment and plan Medication list reviewed, good compliance with all meds.  Past Medical History:   Headache - migraines, codeine prn   Hypertension   Hyperlipidemia   Osteoporosis   Oncology Breast cancer 10-2011 Polycythemia HEART, CV:  --Mild nonobstructive disease on cath in 2003.   --Probable Takotsubo cardiomyopathy: Severe chest pain with normal cath in 1994. Severe chest pain in 2003 with widespread T wave inversions on ECG. Cath with minimal coronary disease but LV-gram showed periapical severe hypokinesis and basilar hyperkinesia (EF 40%). Last echo in 4/09 confirmed full LV functional recovery with EF 60%, no regional wall motion abnormalities, mild to moderate LVH.   H/O CVA   ----------------------------------------------   GI:  GERD Hiatal hernia   Hemorrhoids   Hyperplastic colon polyp, 06/2001   Functional LLQ pain   h/o diverticuli   -----------------------------------------------   Obstructive sleep apnea   Varicose veins   h/o R leg DVT after a venous ablation   OA with bilateral knee pain   4-09 L2 fracture status post vertebroplasty of L2 September 10, 2007 performed by IR   Depression/anxiety -lost husband    Past Surgical History:   Appendectomy   left ovarian cyst   3/07 - stress test neg   Tonsillectomy   Back Surgery   Tubal Ligation   Dental extraction L maxillary molar   Kyphoplasty 08/2007   left total knee replacement 06/2010   Cholecystectomy, 03-2011  L lumpectomy 11-2011  Social History: Married, lost husband 07-29-07.  Lives in [place]. lives in a town house, her daughter- GS live w/ her  driving     Former Smoker: quit 1990 Alcohol use-yes children x 3 ( 2 in [place] , see them occasionaly) Daily Caffeine Use coffee    Review of Systems shortness of breath @ baseline Note nausea, vomiting, diarrhea    Objective:   Physical Exam  General -- alert, well-developed. No apparent distress.  Lungs -- normal respiratory effort, no intercostal retractions, no accessory muscle use, and normal breath sounds.   Heart-- normal rate, regular rhythm, no murmur, and no gallop.   Abdomen--soft, non-tender, no distention, no masses, no HSM, no guarding, and no rigidity.   Extremities-- no pretibial edema bilaterally  Neurologic-- alert & oriented X3 and strength normal in all extremities. Psych-- Cognition and judgment appear intact. Alert and cooperative with normal attention span and concentration.  not anxious appearing and not depressed appearing.       Assessment & Plan:

## 2011-12-12 NOTE — Assessment & Plan Note (Signed)
Has been having difficulty recently geting  her lorazepam, a paper paper prescription was provided today. 2 refills.

## 2011-12-12 NOTE — Assessment & Plan Note (Addendum)
Recently seen with  atypical chest pain, symptoms resolved. No further workup at this point

## 2011-12-13 ENCOUNTER — Encounter: Payer: Self-pay | Admitting: Internal Medicine

## 2011-12-14 ENCOUNTER — Ambulatory Visit (INDEPENDENT_AMBULATORY_CARE_PROVIDER_SITE_OTHER): Payer: Medicare Other | Admitting: Surgery

## 2011-12-14 ENCOUNTER — Encounter (INDEPENDENT_AMBULATORY_CARE_PROVIDER_SITE_OTHER): Payer: Self-pay | Admitting: Surgery

## 2011-12-14 VITALS — BP 124/72 | HR 64 | Temp 97.6°F | Resp 16 | Ht 67.0 in | Wt 174.2 lb

## 2011-12-14 DIAGNOSIS — Z09 Encounter for follow-up examination after completed treatment for conditions other than malignant neoplasm: Secondary | ICD-10-CM

## 2011-12-14 NOTE — Progress Notes (Signed)
Subjective:     Patient ID: [name], female   DOB: [birth year], 78 y.o.   MRN: [id number]  HPI This is a patient of Dr. Doreen Salvage who is 3 weeks status post left breast needle localized lumpectomy. She came in today because of increasing discomfort and shooting pain at the site of surgery.  Review of Systems     Objective:   Physical Exam On exam, the incision is well healing except for a tiny area with some skin sloughing. There is no erythema and no fluctuance to suggest a large seroma    Assessment:     Patient stable postop    Plan:     I reassured her. She will continue pain medications and ice pack. She will keep her on what Dr. Dwain Sarna on August 8

## 2011-12-20 ENCOUNTER — Ambulatory Visit (INDEPENDENT_AMBULATORY_CARE_PROVIDER_SITE_OTHER): Payer: Medicare Other | Admitting: General Surgery

## 2011-12-20 ENCOUNTER — Encounter (INDEPENDENT_AMBULATORY_CARE_PROVIDER_SITE_OTHER): Payer: Self-pay | Admitting: General Surgery

## 2011-12-20 VITALS — BP 142/60 | HR 72 | Temp 98.3°F | Resp 16 | Ht 67.0 in | Wt 175.4 lb

## 2011-12-20 DIAGNOSIS — Z09 Encounter for follow-up examination after completed treatment for conditions other than malignant neoplasm: Secondary | ICD-10-CM

## 2011-12-20 NOTE — Progress Notes (Signed)
Subjective:     Patient ID: KEILAH LEMIRE, female   DOB: [birth year], 78 y.o.   MRN: [id number]  HPI This is an 78 year old female who had a diagnosis of polycythemia vera. At the same time she was also noted to have a left breast cancer. To the operating room and a left breast lumpectomy. Her pathology showed an invasive ductal carcinoma measuring 8 mm in size. The margin was closest at 3 mm and this was posterior. There was associated grade 2 ductal carcinoma in situ that was 2 mm from the anterior margin. It was also associated lobular neoplasia. Her ER and PR were both +100% HER-2/neu was not amplified. She has done well except for some pain in her breast after the surgery.  Review of Systems     Objective:   Physical Exam Left breast with a mild hematoma as well as a small scab on the incision. There is no evidence of any infection at the bases otherwise healing fairly well.    Assessment:     Status post lumpectomy for stage I left breast cancer    Plan:     She did well after surgery. She can get back in a pole and were water aerobics. I will get her in one month just to make sure this continues to heal well. She is on her Femara.

## 2012-01-01 ENCOUNTER — Ambulatory Visit (HOSPITAL_BASED_OUTPATIENT_CLINIC_OR_DEPARTMENT_OTHER): Payer: Medicare Other

## 2012-01-01 ENCOUNTER — Ambulatory Visit (HOSPITAL_BASED_OUTPATIENT_CLINIC_OR_DEPARTMENT_OTHER): Payer: Medicare Other | Admitting: Hematology & Oncology

## 2012-01-01 ENCOUNTER — Other Ambulatory Visit (HOSPITAL_BASED_OUTPATIENT_CLINIC_OR_DEPARTMENT_OTHER): Payer: Medicare Other | Admitting: Lab

## 2012-01-01 VITALS — BP 158/86 | HR 71 | Resp 20

## 2012-01-01 VITALS — BP 163/61 | HR 63 | Temp 97.5°F | Resp 20 | Ht 67.0 in | Wt 176.0 lb

## 2012-01-01 DIAGNOSIS — D509 Iron deficiency anemia, unspecified: Secondary | ICD-10-CM

## 2012-01-01 DIAGNOSIS — D45 Polycythemia vera: Secondary | ICD-10-CM

## 2012-01-01 DIAGNOSIS — C50919 Malignant neoplasm of unspecified site of unspecified female breast: Secondary | ICD-10-CM | POA: Diagnosis not present

## 2012-01-01 DIAGNOSIS — D751 Secondary polycythemia: Secondary | ICD-10-CM

## 2012-01-01 DIAGNOSIS — D473 Essential (hemorrhagic) thrombocythemia: Secondary | ICD-10-CM

## 2012-01-01 LAB — COMPREHENSIVE METABOLIC PANEL
ALT: 14 U/L (ref 0–35)
AST: 19 U/L (ref 0–37)
Albumin: 4.2 g/dL (ref 3.5–5.2)
Alkaline Phosphatase: 85 U/L (ref 39–117)
BUN: 7 mg/dL (ref 6–23)
CO2: 30 mEq/L (ref 19–32)
Calcium: 10 mg/dL (ref 8.4–10.5)
Chloride: 101 mEq/L (ref 96–112)
Creatinine, Ser: 0.77 mg/dL (ref 0.50–1.10)
Glucose, Bld: 85 mg/dL (ref 70–99)
Potassium: 4.7 mEq/L (ref 3.5–5.3)
Sodium: 139 mEq/L (ref 135–145)
Total Bilirubin: 1.4 mg/dL — ABNORMAL HIGH (ref 0.3–1.2)
Total Protein: 7.1 g/dL (ref 6.0–8.3)

## 2012-01-01 LAB — CBC WITH DIFFERENTIAL (CANCER CENTER ONLY)
BASO#: 0.1 10*3/uL (ref 0.0–0.2)
BASO%: 1 % (ref 0.0–2.0)
EOS%: 3.6 % (ref 0.0–7.0)
Eosinophils Absolute: 0.2 10*3/uL (ref 0.0–0.5)
HCT: 49.2 % — ABNORMAL HIGH (ref 34.8–46.6)
HGB: 15.7 g/dL (ref 11.6–15.9)
LYMPH#: 0.9 10*3/uL (ref 0.9–3.3)
LYMPH%: 14.7 % (ref 14.0–48.0)
MCH: 25.2 pg — ABNORMAL LOW (ref 26.0–34.0)
MCHC: 31.9 g/dL — ABNORMAL LOW (ref 32.0–36.0)
MCV: 79 fL — ABNORMAL LOW (ref 81–101)
MONO#: 0.7 10*3/uL (ref 0.1–0.9)
MONO%: 11.2 % (ref 0.0–13.0)
NEUT#: 4.3 10*3/uL (ref 1.5–6.5)
NEUT%: 69.5 % (ref 39.6–80.0)
Platelets: 410 10*3/uL — ABNORMAL HIGH (ref 145–400)
RBC: 6.22 10*6/uL — ABNORMAL HIGH (ref 3.70–5.32)
RDW: 19.4 % — ABNORMAL HIGH (ref 11.1–15.7)
WBC: 6.2 10*3/uL (ref 3.9–10.0)

## 2012-01-01 LAB — CHCC SATELLITE - SMEAR

## 2012-01-01 NOTE — Progress Notes (Signed)
This office note has been dictated.

## 2012-01-01 NOTE — Progress Notes (Signed)
CC:   Dawn Ora, MD Dawn Gosling, MD  DIAGNOSES: 1. Polycythemia vera, JAK2 positive. 2. Stage I (T1b N0 M0) ductal carcinoma of the left breast.  CURRENT THERAPY: 1. Hydrea 500 mg p.o. daily. 2. Phlebotomy to maintain hematocrit less than 45%. 3. Femara 2.5 mg p.o. daily.  INTERIM HISTORY:  [name] comes in for followup.  She feels well. She has done well with the Hydrea.  There is no nausea or vomiting.  She has had no fatigue.  There is no headache.  There has been no diarrhea.  She has done well with the Femara.  She has no arthralgias or myalgias with the Femara.  PHYSICAL EXAMINATION:  General:  This is an elderly but well-nourished white female in no obvious distress.  Vital signs:  97.5, pulse 63, respiratory rate 20, blood pressure 163/61.  Weight is 176.  Head and neck:  Shows a normocephalic, atraumatic skull.  There are no ocular or oral lesions.  There are no palpable cervical or supraclavicular lymph nodes.  Lungs:  Clear bilaterally.  Cardiac:  Regular rate and rhythm with a normal S1 and S2.  There are no murmurs, rubs or bruits. Abdomen:  Soft with good bowel sounds.  There is no palpable abdominal mass.  There is no fluid wave.  There is no palpable hepatosplenomegaly. Back:  No tenderness over the spine, ribs or hips.  Extremities:  Show no clubbing, cyanosis or edema.  Neurological:  Shows no focal neurological deficits.  Skin:  Shows no rashes, ecchymoses or petechiae.  LABORATORY STUDIES:  White cell count is 6.2, hemoglobin 15.7, hematocrit 49.2, platelet count 14.  MCV is 79.  IMPRESSION:  [name] is an 78 year old white female with polycythemia vera.  Again, she is JAK2 positive.  Hydrea certainly is helping control her thrombocytosis.  We will keep her on the [facility] for now.  Hopefully, the Hydrea will also begin to slow down her erythroid production.  We will phlebotomize her.  She is already iron deficient so we will continue to  keep her that way.  We will go ahead and plan to have her come back in like 3 or 4 weeks for followup.  Again, we have to just try to be aggressive with her hematocrit control.    ______________________________ Josph Macho, M.D. PRE/MEDQ  D:  01/01/2012  T:  01/01/2012  Job:  [id number]

## 2012-01-12 ENCOUNTER — Emergency Department (HOSPITAL_BASED_OUTPATIENT_CLINIC_OR_DEPARTMENT_OTHER)
Admission: EM | Admit: 2012-01-12 | Discharge: 2012-01-12 | Disposition: A | Discharge status: Home / Self Care | Payer: Medicare Other | Attending: Emergency Medicine | Admitting: Emergency Medicine

## 2012-01-12 ENCOUNTER — Emergency Department (HOSPITAL_BASED_OUTPATIENT_CLINIC_OR_DEPARTMENT_OTHER): Payer: Medicare Other

## 2012-01-12 ENCOUNTER — Encounter (HOSPITAL_BASED_OUTPATIENT_CLINIC_OR_DEPARTMENT_OTHER): Payer: Self-pay | Admitting: *Deleted

## 2012-01-12 DIAGNOSIS — Z9089 Acquired absence of other organs: Secondary | ICD-10-CM | POA: Insufficient documentation

## 2012-01-12 DIAGNOSIS — M810 Age-related osteoporosis without current pathological fracture: Secondary | ICD-10-CM | POA: Diagnosis not present

## 2012-01-12 DIAGNOSIS — Z86718 Personal history of other venous thrombosis and embolism: Secondary | ICD-10-CM | POA: Insufficient documentation

## 2012-01-12 DIAGNOSIS — L02419 Cutaneous abscess of limb, unspecified: Secondary | ICD-10-CM | POA: Insufficient documentation

## 2012-01-12 DIAGNOSIS — Z7982 Long term (current) use of aspirin: Secondary | ICD-10-CM | POA: Insufficient documentation

## 2012-01-12 DIAGNOSIS — E785 Hyperlipidemia, unspecified: Secondary | ICD-10-CM | POA: Insufficient documentation

## 2012-01-12 DIAGNOSIS — M79609 Pain in unspecified limb: Secondary | ICD-10-CM | POA: Diagnosis not present

## 2012-01-12 DIAGNOSIS — I10 Essential (primary) hypertension: Secondary | ICD-10-CM | POA: Diagnosis not present

## 2012-01-12 DIAGNOSIS — I2510 Atherosclerotic heart disease of native coronary artery without angina pectoris: Secondary | ICD-10-CM | POA: Diagnosis not present

## 2012-01-12 DIAGNOSIS — L0390 Cellulitis, unspecified: Secondary | ICD-10-CM

## 2012-01-12 DIAGNOSIS — L03119 Cellulitis of unspecified part of limb: Secondary | ICD-10-CM | POA: Insufficient documentation

## 2012-01-12 LAB — CBC WITH DIFFERENTIAL/PLATELET
Basophils Absolute: 0 10*3/uL (ref 0.0–0.1)
Basophils Relative: 1 % (ref 0–1)
Eosinophils Absolute: 0.1 10*3/uL (ref 0.0–0.7)
Eosinophils Relative: 3 % (ref 0–5)
HCT: 41.7 % (ref 36.0–46.0)
Hemoglobin: 13.5 g/dL (ref 12.0–15.0)
Lymphocytes Relative: 13 % (ref 12–46)
Lymphs Abs: 0.7 10*3/uL (ref 0.7–4.0)
MCH: 25.6 pg — ABNORMAL LOW (ref 26.0–34.0)
MCHC: 32.4 g/dL (ref 30.0–36.0)
MCV: 79.1 fL (ref 78.0–100.0)
Monocytes Absolute: 0.6 10*3/uL (ref 0.1–1.0)
Monocytes Relative: 11 % (ref 3–12)
Neutro Abs: 3.8 10*3/uL (ref 1.7–7.7)
Neutrophils Relative %: 73 % (ref 43–77)
Platelets: 290 10*3/uL (ref 150–400)
RBC: 5.27 MIL/uL — ABNORMAL HIGH (ref 3.87–5.11)
RDW: 19.7 % — ABNORMAL HIGH (ref 11.5–15.5)
WBC: 5.2 10*3/uL (ref 4.0–10.5)

## 2012-01-12 LAB — BASIC METABOLIC PANEL
BUN: 8 mg/dL (ref 6–23)
CO2: 28 mEq/L (ref 19–32)
Calcium: 9.2 mg/dL (ref 8.4–10.5)
Chloride: 97 mEq/L (ref 96–112)
Creatinine, Ser: 0.8 mg/dL (ref 0.50–1.10)
GFR calc Af Amer: 79 mL/min — ABNORMAL LOW (ref >90)
GFR calc non Af Amer: 68 mL/min — ABNORMAL LOW (ref >90)
Glucose, Bld: 113 mg/dL — ABNORMAL HIGH (ref 70–99)
Potassium: 3.8 mEq/L (ref 3.5–5.1)
Sodium: 134 mEq/L — ABNORMAL LOW (ref 135–145)

## 2012-01-12 LAB — PROTIME-INR
INR: 1 (ref 0.00–1.49)
Prothrombin Time: 13.4 seconds (ref 11.6–15.2)

## 2012-01-12 MED ORDER — CEPHALEXIN 250 MG PO CAPS
500.0000 mg | ORAL_CAPSULE | Freq: Once | ORAL | Status: AC
Start: 2012-01-12 — End: 2012-01-12
  Administered 2012-01-12: 500 mg via ORAL
  Filled 2012-01-12: qty 2

## 2012-01-12 MED ORDER — CEPHALEXIN 500 MG PO CAPS: 500.0000 mg | ORAL_CAPSULE | Freq: Four times a day (QID) | ORAL | Status: DC

## 2012-01-12 MED ORDER — SULFAMETHOXAZOLE-TMP DS 800-160 MG PO TABS
2.0000 | ORAL_TABLET | Freq: Once | ORAL | Status: AC
  Administered 2012-01-12: 2 via ORAL
  Filled 2012-01-12: qty 2

## 2012-01-12 MED ORDER — SULFAMETHOXAZOLE-TMP DS 800-160 MG PO TABS: 2.0000 | ORAL_TABLET | Freq: Two times a day (BID) | ORAL | Status: DC

## 2012-01-12 NOTE — ED Notes (Signed)
Pt states she has a hx of polycythemia and this is a "side effect" of same. C/O left leg pain and redness since yesterday. Chills yesterday. Vomited x 1.

## 2012-01-12 NOTE — ED Notes (Signed)
Pt not in room at this time- unable to draw blood.

## 2012-01-13 NOTE — ED Provider Notes (Signed)
History     CSN: [id number]  Arrival date & time 01/12/12  1642   First MD Initiated Contact with Patient 01/12/12 1702      Chief Complaint  Patient presents with  . Leg Pain    (Consider location/radiation/quality/duration/timing/severity/associated sxs/prior treatment) HPI Patient is an 78 year old female with history of multiple medical problems including DVT and polycythemia who is not currently anticoagulated. She presents today complaining of tenderness to palpation that she rates as a 3/10 over her left lower extremity. Patient has an area of erythema posteriorly this apparently 2 inches in diameter as well as 5 inches in length. She reports that it felt hot earlier today. She denies history of cellulitis or abscess but does report history of thrombophlebitis. Patient was concerned that she might have a blood clot. She denies any fevers, nausea, or vomiting. There no other associated or modifying factors. Past Medical History  Diagnosis Date  . Headache     migraines  . Hyperlipidemia   . Osteoporosis   . CVA (cerebral infarction)   . Hemorrhoids   . Hyperplastic colon polyp 06/2001  . Diverticulitis   . Varicose vein   . DVT (deep venous thrombosis)     after venous ablation, R leg  . Fracture 09/10/07    L2, status post vertebroplasty of L2 performed by IR  . Cardiomyopathy     Probable Takotsubo, severe CP w/ normal cath in 1994. Severe CP in 2003 w/ widespread T wave inversions on ECG. Cath w/ minimal coronary disease but LV-gram showed periapical severe hypokinesis and basilar hyperkinesia (EF 40%). Last echo in 4/09 confirmed full LV functional recovery with EF 60%, no regional wall motion abnormalities, mild to moderate LVH.  . OSA (obstructive sleep apnea) 03-29-11    no cpap used, not a problem now.  Marland Kitchen CAD (coronary artery disease)     mild nonobstructive disease on cath in 2003  . Hypertension   . GERD (gastroesophageal reflux disease)   . Hiatal hernia  03-29-11    no nerve problems  . Depression with anxiety 03-29-11    lost husband 3'09  . OA (osteoarthritis) of knee 03-29-11    w/ bilateral knee pain-not a problem now  . Stroke 03-29-11    CVA x2 -last 10'12-?TIA(visual problems)  . E. coli gastroenteritis 03-29-11    8'10  . Cancer   . Polycythemia     Past Surgical History  Procedure Date  . Appendectomy   . Ovarian cyst surgery     left  . Tonsillectomy   . Back surgery   . Tubal ligation   . Dental extraction     L maxillary molar  . Kyphosis surgery 08/2007    cement used  . Total knee arthroplasty 06/2010    left  . Cholecystectomy 04/09/2011    Procedure: LAPAROSCOPIC CHOLECYSTECTOMY WITH INTRAOPERATIVE CHOLANGIOGRAM;  Surgeon: Adolph Pollack, MD;  Location: [facility];  Service: General;  Laterality: N/A;  . Breast biopsy   . Breast cyst excision   . Breast lumpectomy     left stage I left breast cancer    Family History  Problem Relation Age of Onset  . Colon polyps      cousin  . Colon cancer      cousin  . Breast cancer Mother   . Breast cancer Sister   . Heart disease Father     MIs  . Leukemia Brother     GM    History  Substance  Use Topics  . Smoking status: Former Smoker    Quit date: 05/14/1988  . Smokeless tobacco: Never Used  . Alcohol Use: 0.6 oz/week    1 Cans of beer per week     occasional/social    OB History    Grav Para Term Preterm Abortions TAB SAB Ect Mult Living                  Review of Systems  Constitutional: Negative.   HENT: Negative.   Eyes: Negative.   Respiratory: Negative.   Cardiovascular: Negative.   Gastrointestinal: Negative.   Genitourinary: Negative.   Musculoskeletal:       Left lower extremity pain  Skin: Positive for rash.  Neurological: Negative.   Hematological: Negative.   Psychiatric/Behavioral: Negative.   All other systems reviewed and are negative.    Allergies  Atorvastatin; Ezetimibe; Fluvastatin sodium; Magnesium hydroxide;  Penicillins; Quinapril hcl; Simvastatin; Topamax; and Valsartan  Home Medications   Current Outpatient Rx  Name Route Sig Dispense Refill  . ASPIRIN 81 MG PO TABS Oral Take 162 mg by mouth daily.    Marland Kitchen COENZYME Q10 200 MG PO CAPS Oral Take 1 capsule (200 mg total) by mouth daily.    Marland Kitchen LORAZEPAM 0.5 MG PO TABS Oral Take 0.5 mg by mouth 2 (two) times daily. TAKE 1TABLET BY MOUTH 2 TIMES A DAY AS NEEDED FOR ANXIETY    . METOPROLOL TARTRATE 50 MG PO TABS Oral Take 0.5 tablets (25 mg total) by mouth 2 (two) times daily. 30 tablet 6  . OMEPRAZOLE 40 MG PO CPDR Oral Take 40 mg by mouth daily.     Marland Kitchen ROSUVASTATIN CALCIUM 5 MG PO TABS Oral Take 2.5 mg by mouth daily. Take one every other day    . GAS-X PO Oral Take 1 tablet by mouth daily as needed. For stomach pain.    Marland Kitchen VITAMIN E 400 UNITS PO CAPS Oral Take 400 Units by mouth daily.     . ACETAMINOPHEN-CODEINE #3 300-30 MG PO TABS Oral Take 1 tablet by mouth every 8 (eight) hours as needed. For migraine    . CEPHALEXIN 500 MG PO CAPS Oral Take 1 capsule (500 mg total) by mouth 4 (four) times daily. 40 capsule 0  . FUROSEMIDE 20 MG PO TABS Oral Take 20 mg by mouth daily as needed. For fluid retention    . SULFAMETHOXAZOLE-TMP DS 800-160 MG PO TABS Oral Take 2 tablets by mouth 2 (two) times daily. 40 tablet 0    BP 139/74  Pulse 82  Temp 98 F (36.7 C) (Oral)  Resp 18  Ht 5\' 7"  (1.702 m)  Wt 174 lb (78.926 kg)  BMI 27.25 kg/m2  SpO2 97%  Physical Exam  Nursing note and vitals reviewed. GEN: Well-developed, well-nourished female in no distress HEENT: Atraumatic, normocephalic.  EYES: PERRLA BL, no scleral icterus. NECK: Trachea midline, no meningismus CV: regular rate and rhythm. No murmurs, rubs, or gallops PULM: No respiratory distress.  No crackles, wheezes, or rales. GI: soft, non-tender. No guarding, rebound, or tenderness. + bowel sounds  GU: deferred Neuro: cranial nerves grossly 2-12 intact, no abnormalities of strength or  sensation, A and O x 3 MSK: Left lower extremity with tenderness to palpation over the posterior calf. No pitting edema noted in the lower extremities. Patient otherwise has exam within normal limits.  Skin: No petechiae, purpura, or jaundice. Patient has erythematous area that is approximately 2" x 5" noted over the posterior left  calf. There is no induration or fluctuance to this. Psych: no abnormality of mood   ED Course  Procedures (including critical care time)  Labs Reviewed  CBC WITH DIFFERENTIAL - Abnormal; Notable for the following:    RBC 5.27 (*)     MCH 25.6 (*)     RDW 19.7 (*)     All other components within normal limits  BASIC METABOLIC PANEL - Abnormal; Notable for the following:    Sodium 134 (*)     Glucose, Bld 113 (*)     GFR calc non Af Amer 68 (*)     GFR calc Af Amer 79 (*)     All other components within normal limits  PROTIME-INR   US Venous Img Lower Unilateral Left  01/12/2012  *RADIOLOGY REPORT*  Clinical Data: Left calf and leg pain with history of blood clots and varicose veins.  LEFT LOWER EXTREMITY VENOUS DUPLEX ULTRASOUND  Technique:  Gray-scale sonography with graded compression, as well as color Doppler and duplex ultrasound, were performed to evaluate the deep venous system of the lower extremity from the level of the common femoral vein through the popliteal and proximal calf veins. Spectral Doppler was utilized to evaluate flow at rest and with distal augmentation maneuvers.  Comparison:  None.  Findings: There is no evidence of left lower extremity DVT.  The great saphenous vein is diffusely dilated.  Multiple superficial varicosities are also identified.  No evidence of superficial thrombophlebitis.  IMPRESSION: No evidence of DVT or superficial thrombophlebitis in the left lower extremity.  There is evidence of superficial venous insufficiency with a markedly dilated great saphenous vein identified supplying patent varicosities.   Original Report  Authenticated By: Reola Calkins, M.D.      1. Cellulitis       MDM  Patient was evaluated by myself. Based on patient history she did have CBC and renal panel as well as coags with ultrasound of the left lower extremity. No DVT or thrombophlebitis was noted. Patient did not have an elevated white count and her values on CBC continued to be improved compared to previous values taken. Coags were within normal limits. Given findings cellulitis was considered to be the most likely diagnosis. Patient was treated with Keflex and Bactrim. She was discharged with prescriptions for both. She is to return if she has worsening of her symptoms despite therapies. Otherwise she can followup with her primary care provider as needed.        Cyndra Numbers, MD 01/13/12 [phone]

## 2012-01-15 ENCOUNTER — Ambulatory Visit (INDEPENDENT_AMBULATORY_CARE_PROVIDER_SITE_OTHER): Payer: Medicare Other | Admitting: Internal Medicine

## 2012-01-15 VITALS — BP 118/72 | HR 88 | Temp 97.7°F | Wt 174.0 lb

## 2012-01-15 DIAGNOSIS — L03119 Cellulitis of unspecified part of limb: Secondary | ICD-10-CM | POA: Diagnosis not present

## 2012-01-15 DIAGNOSIS — L02419 Cutaneous abscess of limb, unspecified: Secondary | ICD-10-CM | POA: Diagnosis not present

## 2012-01-15 MED ORDER — DOXYCYCLINE HYCLATE 100 MG PO TABS: 100.0000 mg | ORAL_TABLET | Freq: Two times a day (BID) | ORAL | Status: AC

## 2012-01-15 NOTE — Patient Instructions (Addendum)
Stop Bactrim, start doxycycline twice a day . Keep the leg elevated Call if you're not improving in the next 2 or 3 days, call anytime if you  get worse, have high fever, redness is spreading. ----- Next routine checkup with me is around 04/12/2012

## 2012-01-15 NOTE — Progress Notes (Signed)
  Subjective:    Patient ID: [name], female    DOB: 1930/03/06, 78 y.o.   MRN: [id number]  HPI ER followup Presented to the ER 01/12/2012 with redness and tenderness at the left calf. Physical exam was confirmatory. Ultrasound showed no DVT CBC and BMP were unremarkable. She was diagnosed with cellulitis and prescribed Keflex and Bactrim. Her pharmacist only filled Bactrim. Since that day, she is about the same.  Past Medical History:   Headache - migraines, codeine prn   Hypertension   Hyperlipidemia   Osteoporosis   Oncology Breast cancer 10-2011 Polycythemia HEART, CV:  --Mild nonobstructive disease on cath in 2003.   --Probable Takotsubo cardiomyopathy: Severe chest pain with normal cath in 1994. Severe chest pain in 2003 with widespread T wave inversions on ECG. Cath with minimal coronary disease but LV-gram showed periapical severe hypokinesis and basilar hyperkinesia (EF 40%). Last echo in 4/09 confirmed full LV functional recovery with EF 60%, no regional wall motion abnormalities, mild to moderate LVH.   H/O CVA   ----------------------------------------------   GI:  GERD Hiatal hernia   Hemorrhoids   Hyperplastic colon polyp, 06/2001   Functional LLQ pain   h/o diverticuli   -----------------------------------------------   Obstructive sleep apnea   Varicose veins   h/o R leg DVT after a venous ablation   OA with bilateral knee pain   4-09 L2 fracture status post vertebroplasty of L2 September 10, 2007 performed by IR   Depression/anxiety -lost husband    Past Surgical History:   Appendectomy   left ovarian cyst   3/07 - stress test neg   Tonsillectomy   Back Surgery   Tubal Ligation   Dental extraction L maxillary molar   Kyphoplasty 08/2007   left total knee replacement 06/2010   Cholecystectomy, 03-2011  L lumpectomy 11-2011  Social History: Married, lost husband 07-29-07.  Lives in [place]. lives in a town house, her daughter- GS live w/ her   driving   Former Smoker: quit 1990 Alcohol use-yes children x 3 ( 2 in [place] , see them occasionaly)   Review of Systems Denies fevers but she has some chills at night for the last 4 days. Redness looks about the same. Pain is well-controlled with Tylenol #3 Has not developed swelling. Additionally, she denies sore throat. She also was seen with diarrhea a few months ago, that has completely resolved. Denies redness in the other areas of the lower extremity except for the left calf    Objective:   Physical Exam  Musculoskeletal:       Legs:  Alert oriented x3, no apparent distress. Vital signs stable. Right leg without edema, pedal pulse normal. Left leg: Calf symmetric compared to the right side Good pedal pulses No skin openings      Assessment & Plan:

## 2012-01-16 ENCOUNTER — Encounter: Payer: Self-pay | Admitting: Internal Medicine

## 2012-01-16 DIAGNOSIS — L03119 Cellulitis of unspecified part of limb: Secondary | ICD-10-CM | POA: Insufficient documentation

## 2012-01-16 NOTE — Assessment & Plan Note (Signed)
Redness and tenderness at the left lower extremity, recent ultrasound negative for DVT, recent CBC with normal white count. Findings are not consistent with phlebitis.  She was started on Bactrim and Keflex, pharmacy is only filled Bactrim. She's not better. Plan: Switch to doxycycline If she's not improving, we'll have to consider an alternative diagnosis for cellulitis (erythema nodosum?, other dx?)

## 2012-01-17 ENCOUNTER — Encounter (INDEPENDENT_AMBULATORY_CARE_PROVIDER_SITE_OTHER): Payer: Medicare Other | Admitting: General Surgery

## 2012-01-18 ENCOUNTER — Encounter (INDEPENDENT_AMBULATORY_CARE_PROVIDER_SITE_OTHER): Payer: Medicare Other | Admitting: General Surgery

## 2012-01-22 ENCOUNTER — Ambulatory Visit: Payer: Medicare Other | Admitting: Internal Medicine

## 2012-01-23 ENCOUNTER — Encounter: Payer: Self-pay | Admitting: Internal Medicine

## 2012-01-23 ENCOUNTER — Ambulatory Visit (INDEPENDENT_AMBULATORY_CARE_PROVIDER_SITE_OTHER): Payer: Medicare Other | Admitting: Internal Medicine

## 2012-01-23 VITALS — BP 136/80 | HR 78 | Temp 98.1°F | Wt 174.0 lb

## 2012-01-23 DIAGNOSIS — L02419 Cutaneous abscess of limb, unspecified: Secondary | ICD-10-CM | POA: Diagnosis not present

## 2012-01-23 DIAGNOSIS — L03119 Cellulitis of unspecified part of limb: Secondary | ICD-10-CM | POA: Diagnosis not present

## 2012-01-23 MED ORDER — FUROSEMIDE 20 MG PO TABS: 20.0000 mg | ORAL_TABLET | Freq: Every day | ORAL | Status: DC | PRN

## 2012-01-23 NOTE — Patient Instructions (Addendum)
Finished the antibiotic as prescribed Go back to your regular activities like swimming but when you can keep the legs elevated to prevent swelling. If you don't continue improving--->  let me know. If in the next 10 days if you are still tender in that area let me know. We may need to send you to see the skin doctor

## 2012-01-23 NOTE — Progress Notes (Signed)
  Subjective:    Patient ID: [name], female    DOB: Feb 12, 1930, 78 y.o.   MRN: [id number]  HPI Here with her daughter for re evaluation of cellulitis. Initially, the patient said that she's not getting better, on further questioning the area is still tender but she recognizes that is less red and not warm any more.  Past Medical History:   Headache - migraines, codeine prn   Hypertension   Hyperlipidemia   Osteoporosis   Oncology Breast cancer 10-2011 Polycythemia HEART, CV:  --Mild nonobstructive disease on cath in 2003.   --Probable Takotsubo cardiomyopathy: Severe chest pain with normal cath in 1994. Severe chest pain in 2003 with widespread T wave inversions on ECG. Cath with minimal coronary disease but LV-gram showed periapical severe hypokinesis and basilar hyperkinesia (EF 40%). Last echo in 4/09 confirmed full LV functional recovery with EF 60%, no regional wall motion abnormalities, mild to moderate LVH.   H/O CVA   ----------------------------------------------   GI:  GERD Hiatal hernia   Hemorrhoids   Hyperplastic colon polyp, 06/2001   Functional LLQ pain   h/o diverticuli   -----------------------------------------------   Obstructive sleep apnea   Varicose veins   h/o R leg DVT after a venous ablation   OA with bilateral knee pain   4-09 L2 fracture status post vertebroplasty of L2 September 10, 2007 performed by IR   Depression/anxiety -lost husband    Past Surgical History:   Appendectomy   left ovarian cyst   3/07 - stress test neg   Tonsillectomy   Back Surgery   Tubal Ligation   Dental extraction L maxillary molar   Kyphoplasty 08/2007   left total knee replacement 06/2010   Cholecystectomy, 03-2011  L lumpectomy 11-2011  Social History: Married, lost husband 07-29-07.  Lives in [place]. lives in a town house, her daughter- GS live w/ her  driving   Former Smoker: quit 1990 Alcohol use-yes children x 3 ( 2 in [place])   Review of Systems Denies  fever or chills    Objective:   Physical Exam  Constitutional: She appears well-developed and well-nourished. No distress.  Skin: She is not diaphoretic.   lower extremity: Please see previous graphic, the cellulitis area is definitely not warm, not as red as before, she does have some tender areas mostly in the inner aspect; they are not fluctuant, they don't follow a venous path.The 3 pretibial areas are much improved.      Assessment & Plan:

## 2012-01-23 NOTE — Assessment & Plan Note (Signed)
Overall, I think she is improving. She has few spots that are quite tender, my advice is to continue with antibiotics, see instructions. If she's not completely well,  may need to consult dermatology, Dr. Rip Harbour.  If she has remaining sq tender places they will need to start thinking about panniculitis, erythema nodosum, etc

## 2012-01-24 DIAGNOSIS — L03818 Cellulitis of other sites: Secondary | ICD-10-CM | POA: Diagnosis not present

## 2012-01-24 DIAGNOSIS — L02818 Cutaneous abscess of other sites: Secondary | ICD-10-CM | POA: Diagnosis not present

## 2012-01-28 ENCOUNTER — Ambulatory Visit: Payer: Medicare Other | Admitting: Internal Medicine

## 2012-01-29 DIAGNOSIS — L03818 Cellulitis of other sites: Secondary | ICD-10-CM | POA: Diagnosis not present

## 2012-01-29 DIAGNOSIS — L02818 Cutaneous abscess of other sites: Secondary | ICD-10-CM | POA: Diagnosis not present

## 2012-01-29 DIAGNOSIS — I8310 Varicose veins of unspecified lower extremity with inflammation: Secondary | ICD-10-CM | POA: Diagnosis not present

## 2012-02-05 DIAGNOSIS — I8310 Varicose veins of unspecified lower extremity with inflammation: Secondary | ICD-10-CM | POA: Diagnosis not present

## 2012-02-11 ENCOUNTER — Encounter (INDEPENDENT_AMBULATORY_CARE_PROVIDER_SITE_OTHER): Payer: Self-pay | Admitting: General Surgery

## 2012-02-11 ENCOUNTER — Ambulatory Visit (INDEPENDENT_AMBULATORY_CARE_PROVIDER_SITE_OTHER): Payer: Medicare Other | Admitting: General Surgery

## 2012-02-11 VITALS — BP 130/72 | HR 90 | Temp 98.2°F | Resp 18 | Ht 67.0 in | Wt 172.4 lb

## 2012-02-11 DIAGNOSIS — Z09 Encounter for follow-up examination after completed treatment for conditions other than malignant neoplasm: Secondary | ICD-10-CM

## 2012-02-11 LAB — HEPATIC FUNCTION PANEL
ALT: 11 U/L (ref 0–35)
AST: 19 U/L (ref 0–37)
Albumin: 4.3 g/dL (ref 3.5–5.2)
Alkaline Phosphatase: 95 U/L (ref 39–117)
Bilirubin, Direct: 0.2 mg/dL (ref 0.0–0.3)
Indirect Bilirubin: 1 mg/dL — ABNORMAL HIGH (ref 0.0–0.9)
Total Bilirubin: 1.2 mg/dL (ref 0.3–1.2)
Total Protein: 7.2 g/dL (ref 6.0–8.3)

## 2012-02-11 LAB — LIPASE: Lipase: 22 U/L (ref 0–75)

## 2012-02-12 NOTE — Progress Notes (Signed)
Subjective:     Patient ID: [name], female   DOB: 22-Jun-1929, 78 y.o.   MRN: [id number]  HPI This is an 78 year old female who hadsa diagnosis of polycythemia vera. She has also fairly recently undergone a left breast lumpectomy. Her pathology showed an invasive ductal carcinoma measuring 8 mm in size. The margin was closest at 3 mm and this was posterior. There was associated grade 2 ductal carcinoma in situ that was 2 mm from the anterior margin. There was also associated lobular neoplasia. Her ER and PR were both +100% HER-2/neu was not amplified. She has done well except for some pain in her breast after the surgery. She returns today due to some continued soreness at her incision but this is all getting better.  She also complains of some intermittent abdominal pain after her gallbladder was removed.   Review of Systems     Objective:   Physical Exam Healed left breast incision without infection Abdomen mild tenderness at epigastrium otherwise normal    Assessment:     S/p lap chole with abdominal pain S/p left breast lumpectomy for T1NX breast cancer    Plan:     I think she is fairly normal after her surgery. This will continue to get better over some time. She is following up with medical oncology. I told her I would see her as well as she would like to. If so I will plan on seeing her on an annual basis. Also just check her liver function tests and her lipase due to the pain. If this is normal I will refer her back to her primary care physician.

## 2012-02-19 DIAGNOSIS — I8310 Varicose veins of unspecified lower extremity with inflammation: Secondary | ICD-10-CM | POA: Diagnosis not present

## 2012-02-25 ENCOUNTER — Telehealth (INDEPENDENT_AMBULATORY_CARE_PROVIDER_SITE_OTHER): Payer: Self-pay | Admitting: General Surgery

## 2012-02-25 NOTE — Telephone Encounter (Signed)
Spoke with pt and informed her that her labs were okay and that she needs to f/u with her PCP from here on out.  She said this was fine.

## 2012-02-25 NOTE — Telephone Encounter (Signed)
Message copied by Littie Deeds on Mon Feb 25, 2012 11:30 AM ------      Message from: [place], [place]      Created: Mon Feb 25, 2012 11:12 AM       Her labs were pretty normal.  Following up with pcp is good plan not really anything else for [place] to do

## 2012-04-02 ENCOUNTER — Other Ambulatory Visit (HOSPITAL_BASED_OUTPATIENT_CLINIC_OR_DEPARTMENT_OTHER): Payer: Medicare Other | Admitting: Lab

## 2012-04-02 ENCOUNTER — Ambulatory Visit (HOSPITAL_BASED_OUTPATIENT_CLINIC_OR_DEPARTMENT_OTHER): Payer: Medicare Other

## 2012-04-02 ENCOUNTER — Ambulatory Visit (HOSPITAL_BASED_OUTPATIENT_CLINIC_OR_DEPARTMENT_OTHER): Payer: Medicare Other | Admitting: Medical

## 2012-04-02 VITALS — BP 156/78 | HR 93 | Temp 97.7°F | Resp 18 | Ht 67.0 in | Wt 176.0 lb

## 2012-04-02 DIAGNOSIS — J069 Acute upper respiratory infection, unspecified: Secondary | ICD-10-CM | POA: Diagnosis not present

## 2012-04-02 DIAGNOSIS — Z170 Estrogen receptor positive status [ER+]: Secondary | ICD-10-CM | POA: Diagnosis not present

## 2012-04-02 DIAGNOSIS — D751 Secondary polycythemia: Secondary | ICD-10-CM

## 2012-04-02 DIAGNOSIS — D45 Polycythemia vera: Secondary | ICD-10-CM

## 2012-04-02 DIAGNOSIS — C50919 Malignant neoplasm of unspecified site of unspecified female breast: Secondary | ICD-10-CM

## 2012-04-02 LAB — CBC WITH DIFFERENTIAL (CANCER CENTER ONLY)
BASO#: 0 10*3/uL (ref 0.0–0.2)
BASO%: 0.8 % (ref 0.0–2.0)
EOS%: 2.9 % (ref 0.0–7.0)
Eosinophils Absolute: 0.1 10*3/uL (ref 0.0–0.5)
HCT: 46.8 % — ABNORMAL HIGH (ref 34.8–46.6)
HGB: 14.8 g/dL (ref 11.6–15.9)
LYMPH#: 1.1 10*3/uL (ref 0.9–3.3)
LYMPH%: 23.3 % (ref 14.0–48.0)
MCH: 28.2 pg (ref 26.0–34.0)
MCHC: 31.6 g/dL — ABNORMAL LOW (ref 32.0–36.0)
MCV: 89 fL (ref 81–101)
MONO#: 0.6 10*3/uL (ref 0.1–0.9)
MONO%: 12.1 % (ref 0.0–13.0)
NEUT#: 2.9 10*3/uL (ref 1.5–6.5)
NEUT%: 60.9 % (ref 39.6–80.0)
Platelets: 334 10*3/uL (ref 145–400)
RBC: 5.25 10*6/uL (ref 3.70–5.32)
RDW: 17.3 % — ABNORMAL HIGH (ref 11.1–15.7)
WBC: 4.8 10*3/uL (ref 3.9–10.0)

## 2012-04-02 LAB — IRON AND TIBC
%SAT: 9 % — ABNORMAL LOW (ref 20–55)
Iron: 43 ug/dL (ref 42–145)
TIBC: 456 ug/dL (ref 250–470)
UIBC: 413 ug/dL — ABNORMAL HIGH (ref 125–400)

## 2012-04-02 LAB — FERRITIN: Ferritin: 27 ng/mL (ref 10–291)

## 2012-04-02 NOTE — Progress Notes (Signed)
Diagnoses: #1 polycythemia vera, JAK2 positive. #2, stage I (T1 B., N0, M0) ductal carcinoma of left breast. ER,, PR +, HER-2/NEU negative.  Current therapy: #1 Hydrea, 500 mg by mouth daily. #2 phlebotomy to maintain hematocrit less than 45%. #3 Femara 2.5 mg by mouth daily.  Interim history: [name] presents today for an office followup visit.  She continues on the Hydrea, as well as the Femara without any complications.  She does report an upper respiratory infection for about a week now.  She states, that she does have a productive cough, however, her phlegm is clear.  She's not reported any fevers with this.  She does not report any increase shortness of breath.  She does have cardiomyopathy.  She does report some chest congestion.  She refuses to get a chest x-ray.  In terms of the, Hydrea.  She's not reported any nausea or vomiting.  She does not report any excessive fatigue or headaches.  She does have chronic diarrhea, which is not new.  She continues on the Femara and is not complaining of any arthralgias or myalgias.  She has a good appetite.  She denies any fevers, chills, or night sweats.  She denies any headaches, visual changes, or rashes.  She denies any obvious, bleeding or bruising.  She denies any lower leg swelling.  Review of Systems: Constitutional:Negative for malaise/fatigue, fever, chills, weight loss, diaphoresis, activity change, appetite change, and unexpected weight change.  HEENT: Negative for double vision, blurred vision, visual loss, ear pain, tinnitus, congestion, rhinorrhea, epistaxis sore throat or sinus disease, oral pain/lesion, tongue soreness Respiratory: Positive forr cough/congestion, Negative for chest tightness, shortness of breath, wheezing and stridor.  Cardiovascular: Negative for chest pain, palpitations, leg swelling, orthopnea, PND, DOE or claudication Gastrointestinal: Negative for nausea, vomiting, abdominal pain, diarrhea, constipation, blood  in stool, melena, hematochezia, abdominal distention, anal bleeding, rectal pain, anorexia and hematemesis.  Genitourinary: Negative for dysuria, frequency, hematuria,  Musculoskeletal: Negative for myalgias, back pain, joint swelling, arthralgias and gait problem.  Skin: Negative for rash, color change, pallor and wound.  Neurological:. Negative for dizziness/light-headedness, tremors, seizures, syncope, facial asymmetry, speech difficulty, weakness, numbness, headaches and paresthesias.  Hematological: Negative for adenopathy. Does not bruise/bleed easily.  Psychiatric/Behavioral:  Negative for depression, no loss of interest in normal activity or change in sleep pattern.   Physical Exam: This is an elderly, 78 year old, well nourished, well-developed, white female, in no obvious distress Vitals: Temperature 97.7 degrees, pulse 92, respirations 18, blood pressure 156/78, weight 176 pounds HEENT reveals a normocephalic, atraumatic skull, no scleral icterus, no oral lesions  Neck is supple without any cervical or supraclavicular adenopathy.  Lungs are clear to auscultation bilaterally. There are no wheezes, rales or rhonci Cardiac is regular rate and rhythm with a normal S1 and S2. There are no murmurs, rubs, or bruits.  Abdomen is soft with good bowel sounds, there is no palpable mass. There is no palpable hepatosplenomegaly. There is no palpable fluid wave.  Musculoskeletal no tenderness of the spine, ribs, or hips.  Extremities there are no clubbing, cyanosis, or edema.  Skin no petechia, purpura or ecchymosis Neurologic is nonfocal.  Laboratory Data: White count 4.8, hemoglobin 14.8, hematocrit 46.8, platelets 334,000  Current Outpatient Prescriptions on File Prior to Visit  Medication Sig Dispense Refill  . acetaminophen-codeine (TYLENOL #3) 300-30 MG per tablet Take 1 tablet by mouth every 8 (eight) hours as needed. For migraine      . aspirin 81 MG tablet Take 162 mg  by mouth daily.       . Coenzyme Q10 200 MG capsule Take 1 capsule (200 mg total) by mouth daily.      . furosemide (LASIX) 20 MG tablet Take 1 tablet (20 mg total) by mouth daily as needed. For fluid retention  30 tablet  6  . LORazepam (ATIVAN) 0.5 MG tablet Take 0.5 mg by mouth 2 (two) times daily. TAKE 1TABLET BY MOUTH 2 TIMES A DAY AS NEEDED FOR ANXIETY      . metoprolol (LOPRESSOR) 50 MG tablet Take 0.5 tablets (25 mg total) by mouth 2 (two) times daily.  30 tablet  6  . omeprazole (PRILOSEC) 40 MG capsule Take 40 mg by mouth daily.       . rosuvastatin (CRESTOR) 5 MG tablet Take 2.5 mg by mouth daily. Take one every other day      . Simethicone (GAS-X PO) Take 1 tablet by mouth daily as needed. For stomach pain.      . vitamin E 400 UNIT capsule Take 400 Units by mouth daily.        Assessment/Plan: This is a pleasant, 78 year old, white female, with the following issues:  #1 polycythemia vera, JAK2 positive.  Her hematocrit is 46.8. as such, we will go ahead and phlebotomize her today.  We will continue to keep her on the Hydrea to help control her thrombocytosis.  She tolerates the Hydrea well without any problems.  #2.  Stage I ductal carcinoma of the left breast.  She remains on Femara without any problems.  #3.  Upper respiratory infection.  I am going to go ahead and give her prescription for Z-Pak.  I did advise her to get a chest x-ray, however, she refused.  I explained to her that if her symptoms do not improve, she needs to followup with her primary care physician.  She voiced her understanding.  #4.  Followup.  [name] will follow back up with [place] in 4 weeks, but before then should there be questions or concerns.

## 2012-04-02 NOTE — Progress Notes (Signed)
[name] presents today for phlebotomy per MD orders. Phlebotomy procedure started at 1200 and ended at 1230. removed. Patient observed for 30 minutes after procedure without any incident. Patient tolerated procedure well. IV needle removed intact.

## 2012-04-14 ENCOUNTER — Ambulatory Visit (INDEPENDENT_AMBULATORY_CARE_PROVIDER_SITE_OTHER): Payer: Medicare Other | Admitting: Internal Medicine

## 2012-04-14 ENCOUNTER — Encounter: Payer: Self-pay | Admitting: Internal Medicine

## 2012-04-14 VITALS — BP 140/80 | HR 73 | Temp 97.6°F | Wt 174.0 lb

## 2012-04-14 DIAGNOSIS — I10 Essential (primary) hypertension: Secondary | ICD-10-CM

## 2012-04-14 DIAGNOSIS — R51 Headache: Secondary | ICD-10-CM

## 2012-04-14 DIAGNOSIS — G459 Transient cerebral ischemic attack, unspecified: Secondary | ICD-10-CM

## 2012-04-14 DIAGNOSIS — E785 Hyperlipidemia, unspecified: Secondary | ICD-10-CM | POA: Diagnosis not present

## 2012-04-14 DIAGNOSIS — R609 Edema, unspecified: Secondary | ICD-10-CM

## 2012-04-14 DIAGNOSIS — F341 Dysthymic disorder: Secondary | ICD-10-CM | POA: Diagnosis not present

## 2012-04-14 MED ORDER — HYDROCHLOROTHIAZIDE 25 MG PO TABS: 25.0000 mg | ORAL_TABLET | Freq: Every day | ORAL | Status: DC | PRN

## 2012-04-14 MED ORDER — METOPROLOL TARTRATE 50 MG PO TABS: 25.0000 mg | ORAL_TABLET | Freq: Two times a day (BID) | ORAL | Status: DC

## 2012-04-14 MED ORDER — LORAZEPAM 0.5 MG PO TABS: 0.5000 mg | ORAL_TABLET | Freq: Two times a day (BID) | ORAL | Status: DC

## 2012-04-14 NOTE — Assessment & Plan Note (Signed)
Use to take Lasix PRN but does not like it, according to the patient triggers migraines. Trial with HCTZ as needed only

## 2012-04-14 NOTE — Assessment & Plan Note (Signed)
Currently on aspirin 81 mg

## 2012-04-14 NOTE — Progress Notes (Signed)
  Subjective:    Patient ID: [name], female    DOB: [birth year], 78 y.o.   MRN: [id number]  HPI Routine visit In general doing well. Saw hematology 04/02/2012, had a phlebotomy. Had URI symptoms, hematology prescribed a Z-Pak, she feels better. She used to use Lasix as needed for edema, it triggered headaches, patient self discontinue it. Would like another diuretic to be prescribed.   Past Medical History:   Headache - migraines, codeine prn   Hypertension   Hyperlipidemia   Osteoporosis   Oncology Breast cancer 10-2011 Polycythemia HEART, CV:  --Mild nonobstructive disease on cath in 2003.   --Probable Takotsubo cardiomyopathy: Severe chest pain with normal cath in 1994. Severe chest pain in 2003 with widespread T wave inversions on ECG. Cath with minimal coronary disease but LV-gram showed periapical severe hypokinesis and basilar hyperkinesia (EF 40%). Last echo in 4/09 confirmed full LV functional recovery with EF 60%, no regional wall motion abnormalities, mild to moderate LVH.   H/O CVA   ----------------------------------------------   GI:  GERD Hiatal hernia   Hemorrhoids   Hyperplastic colon polyp, 06/2001   Functional LLQ pain   h/o diverticuli   -----------------------------------------------   Obstructive sleep apnea   Varicose veins   h/o R leg DVT after a venous ablation   OA with bilateral knee pain   4-09 L2 fracture status post vertebroplasty of L2 September 10, 2007 performed by IR   Depression/anxiety -lost husband    Past Surgical History:   Appendectomy   left ovarian cyst   3/07 - stress test neg   Tonsillectomy   Back Surgery   Tubal Ligation   Dental extraction L maxillary molar   Kyphoplasty 08/2007   left total knee replacement 06/2010   Cholecystectomy, 03-2011  L lumpectomy 11-2011  Social History: Married, lost husband 07-29-07.  Lives in [place]. lives in a town house, her daughter- GS live w/ her  driving   Former Smoker: quit  1990 Alcohol use-yes children x 3 ( 2 in [place])   Review of Systems No chest pain or shortness of breath No nausea, vomiting, diarrhea. In the last 2 days has is occasional pulsatile tinnitus.    Objective:   Physical Exam General -- alert, well-developed. No apparent distress.  HEENT -- TMs normal, throat w/o redness, face symmetric ,nose not congested   Lungs -- normal respiratory effort, no intercostal retractions, no accessory muscle use, and normal breath sounds.   Heart-- normal rate, regular rhythm, no murmur, and no gallop.    Extremities-- no pretibial edema bilaterally  Neurologic-- alert & oriented X3 and strength normal in all extremities. Psych-- Cognition and judgment appear intact. Alert and cooperative with normal attention span and concentration.  not anxious appearing and not depressed appearing.        Assessment & Plan:

## 2012-04-14 NOTE — Assessment & Plan Note (Signed)
Well controlled 

## 2012-04-14 NOTE — Assessment & Plan Note (Signed)
Well-controlled on PRN lorazepam

## 2012-04-14 NOTE — Assessment & Plan Note (Signed)
Samples of Crestor provided 

## 2012-04-14 NOTE — Assessment & Plan Note (Signed)
Has the diagnosis of migraines, uses Tylenol No. 3 as needed

## 2012-04-30 ENCOUNTER — Ambulatory Visit (HOSPITAL_BASED_OUTPATIENT_CLINIC_OR_DEPARTMENT_OTHER): Payer: Medicare Other | Admitting: Medical

## 2012-04-30 ENCOUNTER — Ambulatory Visit: Payer: Medicare Other

## 2012-04-30 ENCOUNTER — Other Ambulatory Visit (HOSPITAL_BASED_OUTPATIENT_CLINIC_OR_DEPARTMENT_OTHER): Payer: Medicare Other | Admitting: Lab

## 2012-04-30 VITALS — BP 135/79 | HR 74 | Temp 96.6°F | Wt 181.0 lb

## 2012-04-30 DIAGNOSIS — D751 Secondary polycythemia: Secondary | ICD-10-CM

## 2012-04-30 DIAGNOSIS — D45 Polycythemia vera: Secondary | ICD-10-CM | POA: Diagnosis not present

## 2012-04-30 DIAGNOSIS — C50919 Malignant neoplasm of unspecified site of unspecified female breast: Secondary | ICD-10-CM

## 2012-04-30 LAB — COMPREHENSIVE METABOLIC PANEL WITH GFR
ALT: 10 U/L (ref 0–35)
AST: 17 U/L (ref 0–37)
Albumin: 4.2 g/dL (ref 3.5–5.2)
Alkaline Phosphatase: 79 U/L (ref 39–117)
BUN: 9 mg/dL (ref 6–23)
CO2: 28 meq/L (ref 19–32)
Calcium: 9.1 mg/dL (ref 8.4–10.5)
Chloride: 101 meq/L (ref 96–112)
Creatinine, Ser: 0.73 mg/dL (ref 0.50–1.10)
Glucose, Bld: 137 mg/dL — ABNORMAL HIGH (ref 70–99)
Potassium: 4.6 meq/L (ref 3.5–5.3)
Sodium: 138 meq/L (ref 135–145)
Total Bilirubin: 1.4 mg/dL — ABNORMAL HIGH (ref 0.3–1.2)
Total Protein: 6.5 g/dL (ref 6.0–8.3)

## 2012-04-30 LAB — CBC WITH DIFFERENTIAL (CANCER CENTER ONLY)
BASO#: 0 10*3/uL (ref 0.0–0.2)
BASO%: 0.8 % (ref 0.0–2.0)
EOS%: 3.5 % (ref 0.0–7.0)
Eosinophils Absolute: 0.2 10*3/uL (ref 0.0–0.5)
HCT: 41.4 % (ref 34.8–46.6)
HGB: 12.9 g/dL (ref 11.6–15.9)
LYMPH#: 1 10*3/uL (ref 0.9–3.3)
LYMPH%: 20.2 % (ref 14.0–48.0)
MCH: 28.6 pg (ref 26.0–34.0)
MCHC: 31.2 g/dL — ABNORMAL LOW (ref 32.0–36.0)
MCV: 92 fL (ref 81–101)
MONO#: 0.4 10*3/uL (ref 0.1–0.9)
MONO%: 8.2 % (ref 0.0–13.0)
NEUT#: 3.3 10*3/uL (ref 1.5–6.5)
NEUT%: 67.3 % (ref 39.6–80.0)
Platelets: 332 10*3/uL (ref 145–400)
RBC: 4.51 10*6/uL (ref 3.70–5.32)
RDW: 15.3 % (ref 11.1–15.7)
WBC: 4.9 10*3/uL (ref 3.9–10.0)

## 2012-04-30 LAB — IRON AND TIBC
%SAT: 9 % — ABNORMAL LOW (ref 20–55)
Iron: 41 ug/dL — ABNORMAL LOW (ref 42–145)
TIBC: 444 ug/dL (ref 250–470)
UIBC: 403 ug/dL — ABNORMAL HIGH (ref 125–400)

## 2012-04-30 LAB — CHCC SATELLITE - SMEAR

## 2012-04-30 LAB — FERRITIN: Ferritin: 20 ng/mL (ref 10–291)

## 2012-04-30 NOTE — Progress Notes (Signed)
Diagnoses: #1 polycythemia vera, JAK2 positive. #2, stage I (T1b., N0, M0) ductal carcinoma of left breast. ER,, PR +, HER-2/NEU negative.  Current therapy: #1 Hydrea, 500 mg by mouth daily. #2 phlebotomy to maintain hematocrit less than 45%. #3 Femara 2.5 mg by mouth daily.  Interim history: [name] presents today for an office followup visit.  She continues on the Hydrea, as well as the Femara without any complications.  She does report a small rash on her, chest.  That's been there about 6-8 weeks.  She reports, that she has not changed any of her soaps or detergents.  She is going to followup with her primary care physician.  She's not reported any nausea or vomiting.  She does not report any excessive fatigue or headaches.  She does have chronic diarrhea, which is not new.  She continues on the Femara and is not complaining of any arthralgias or myalgias.  She has a good appetite.  She denies any fevers, chills, or night sweats.  She denies any headaches or visual changes.  She denies any obvious, bleeding or bruising.  She denies any lower leg swelling.  Surprisingly enough, her hematocrit is 41.4.  Today, S., that she will not need a phlebotomy.  We like to try and keep her below 45%.  She remains iron deficient.  Her last iron panel.  Back on November 20 revealed an iron of 43, with 9% saturation.  We will continue to keep her this way.  Review of Systems: Constitutional:Negative for malaise/fatigue, fever, chills, weight loss, diaphoresis, activity change, appetite change, and unexpected weight change.  HEENT: Negative for double vision, blurred vision, visual loss, ear pain, tinnitus, congestion, rhinorrhea, epistaxis sore throat or sinus disease, oral pain/lesion, tongue soreness Respiratory: Negative for cough/congestion, chest tightness, shortness of breath, wheezing and stridor.  Cardiovascular: Negative for chest pain, palpitations, leg swelling, orthopnea, PND, DOE or  claudication Gastrointestinal: Negative for nausea, vomiting, abdominal pain, diarrhea, constipation, blood in stool, melena, hematochezia, abdominal distention, anal bleeding, rectal pain, anorexia and hematemesis.  Genitourinary: Negative for dysuria, frequency, hematuria,  Musculoskeletal: Negative for myalgias, back pain, joint swelling, arthralgias and gait problem.  Skin: Negative for rash, color change, pallor and wound.  Neurological:. Negative for dizziness/light-headedness, tremors, seizures, syncope, facial asymmetry, speech difficulty, weakness, numbness, headaches and paresthesias.  Hematological: Negative for adenopathy. Does not bruise/bleed easily.  Psychiatric/Behavioral:  Negative for depression, no loss of interest in normal activity or change in sleep pattern.   Physical Exam: This is an elderly, 78 year old, well nourished, well-developed, white female, in no obvious distress Vitals: Temperature 96.6 degrees, pulse 74, respirations 20, blood pressure 135/79, weight 181 pounds HEENT reveals a normocephalic, atraumatic skull, no scleral icterus, no oral lesions  Neck is supple without any cervical or supraclavicular adenopathy.  Lungs are clear to auscultation bilaterally. There are no wheezes, rales or rhonci Cardiac is regular rate and rhythm with a normal S1 and S2. There are no murmurs, rubs, or bruits.  Abdomen is soft with good bowel sounds, there is no palpable mass. There is no palpable hepatosplenomegaly. There is no palpable fluid wave.  Musculoskeletal no tenderness of the spine, ribs, or hips.  Extremities there are no clubbing, cyanosis, or edema.  Skin no petechia, purpura or ecchymosis Neurologic is nonfocal.  Laboratory Data: White count 4.9, hemoglobin 12.9, hematocrit 41.4, platelets 332,000  Current Outpatient Prescriptions on File Prior to Visit  Medication Sig Dispense Refill  . acetaminophen-codeine (TYLENOL #3) 300-30 MG per tablet Take  1 tablet by  mouth every 8 (eight) hours as needed. For migraine      . aspirin 81 MG tablet Take 162 mg by mouth daily.      . hydrochlorothiazide (HYDRODIURIL) 25 MG tablet Take 1 tablet (25 mg total) by mouth daily as needed (swelling in the legs ).  30 tablet  1  . hydroxyurea (HYDREA) 500 MG capsule Take 500 mg by mouth daily. May take with food to minimize GI side effects.      Marland Kitchen letrozole (FEMARA) 2.5 MG tablet Take 2.5 mg by mouth daily.      Marland Kitchen LORazepam (ATIVAN) 0.5 MG tablet Take 1 tablet (0.5 mg total) by mouth 2 (two) times daily. TAKE 1TABLET BY MOUTH 2 TIMES A DAY AS NEEDED FOR ANXIETY  30 tablet  5  . metoprolol (LOPRESSOR) 50 MG tablet Take 0.5 tablets (25 mg total) by mouth 2 (two) times daily.  90 tablet  1  . omeprazole (PRILOSEC) 40 MG capsule Take 20 mg by mouth daily.       . rosuvastatin (CRESTOR) 5 MG tablet Take 2.5 mg by mouth daily. Take one every other day      . Simethicone (GAS-X PO) Take 1 tablet by mouth daily as needed. For stomach pain.      . vitamin E 400 UNIT capsule Take 400 Units by mouth daily.        Assessment/Plan: This is a pleasant, 78 year old, white female, with the following issues:  #1 polycythemia vera, JAK2 positive.  Her hematocrit is 41.4, as such, she will not need a phlebotomy today.  We will continue to keep her on the Hydrea to help control her thrombocytosis.  She tolerates the Hydrea well without any problems.  #2.  Stage I ductal carcinoma of the left breast.  She remains on Femara without any problems.  #3.  Followup.  [name] will follow back up with [place] in 4 weeks, but before then should there be questions or concerns.

## 2012-04-30 NOTE — Progress Notes (Signed)
Phlebotomy held. Hgb less <45%.

## 2012-05-12 ENCOUNTER — Other Ambulatory Visit: Payer: Self-pay | Admitting: Internal Medicine

## 2012-05-15 NOTE — Telephone Encounter (Signed)
Spoke to pharmacy, they did not receive refill sent on 12.2.13. Re-sent rx to pharmacy.

## 2012-05-28 ENCOUNTER — Ambulatory Visit: Payer: Medicare Other

## 2012-05-28 ENCOUNTER — Other Ambulatory Visit (HOSPITAL_BASED_OUTPATIENT_CLINIC_OR_DEPARTMENT_OTHER): Payer: Medicare Other | Admitting: Lab

## 2012-05-28 ENCOUNTER — Ambulatory Visit (HOSPITAL_BASED_OUTPATIENT_CLINIC_OR_DEPARTMENT_OTHER): Payer: Medicare Other | Admitting: Hematology & Oncology

## 2012-05-28 VITALS — BP 161/76 | HR 98 | Temp 97.3°F | Resp 18 | Ht 67.0 in | Wt 182.0 lb

## 2012-05-28 DIAGNOSIS — D751 Secondary polycythemia: Secondary | ICD-10-CM

## 2012-05-28 DIAGNOSIS — D45 Polycythemia vera: Secondary | ICD-10-CM

## 2012-05-28 DIAGNOSIS — C50919 Malignant neoplasm of unspecified site of unspecified female breast: Secondary | ICD-10-CM

## 2012-05-28 DIAGNOSIS — Z170 Estrogen receptor positive status [ER+]: Secondary | ICD-10-CM

## 2012-05-28 DIAGNOSIS — H353190 Nonexudative age-related macular degeneration, unspecified eye, stage unspecified: Secondary | ICD-10-CM | POA: Diagnosis not present

## 2012-05-28 LAB — CBC WITH DIFFERENTIAL (CANCER CENTER ONLY)
BASO#: 0 10*3/uL (ref 0.0–0.2)
BASO%: 0.6 % (ref 0.0–2.0)
EOS%: 3.3 % (ref 0.0–7.0)
Eosinophils Absolute: 0.2 10*3/uL (ref 0.0–0.5)
HCT: 40.8 % (ref 34.8–46.6)
HGB: 12.9 g/dL (ref 11.6–15.9)
LYMPH#: 1.4 10*3/uL (ref 0.9–3.3)
LYMPH%: 21.8 % (ref 14.0–48.0)
MCH: 28.7 pg (ref 26.0–34.0)
MCHC: 31.6 g/dL — ABNORMAL LOW (ref 32.0–36.0)
MCV: 91 fL (ref 81–101)
MONO#: 0.7 10*3/uL (ref 0.1–0.9)
MONO%: 10.5 % (ref 0.0–13.0)
NEUT#: 4 10*3/uL (ref 1.5–6.5)
NEUT%: 63.8 % (ref 39.6–80.0)
Platelets: 304 10*3/uL (ref 145–400)
RBC: 4.5 10*6/uL (ref 3.70–5.32)
RDW: 14.3 % (ref 11.1–15.7)
WBC: 6.3 10*3/uL (ref 3.9–10.0)

## 2012-05-28 LAB — COMPREHENSIVE METABOLIC PANEL
ALT: 11 U/L (ref 0–35)
AST: 15 U/L (ref 0–37)
Albumin: 4.3 g/dL (ref 3.5–5.2)
Alkaline Phosphatase: 85 U/L (ref 39–117)
BUN: 11 mg/dL (ref 6–23)
CO2: 30 mEq/L (ref 19–32)
Calcium: 9.6 mg/dL (ref 8.4–10.5)
Chloride: 101 mEq/L (ref 96–112)
Creatinine, Ser: 0.67 mg/dL (ref 0.50–1.10)
Glucose, Bld: 96 mg/dL (ref 70–99)
Potassium: 3.8 mEq/L (ref 3.5–5.3)
Sodium: 140 mEq/L (ref 135–145)
Total Bilirubin: 1.4 mg/dL — ABNORMAL HIGH (ref 0.3–1.2)
Total Protein: 6.8 g/dL (ref 6.0–8.3)

## 2012-05-28 LAB — IRON AND TIBC
%SAT: 8 % — ABNORMAL LOW (ref 20–55)
Iron: 36 ug/dL — ABNORMAL LOW (ref 42–145)
TIBC: 466 ug/dL (ref 250–470)
UIBC: 430 ug/dL — ABNORMAL HIGH (ref 125–400)

## 2012-05-28 LAB — FERRITIN: Ferritin: 28 ng/mL (ref 10–291)

## 2012-05-28 LAB — CHCC SATELLITE - SMEAR

## 2012-05-28 NOTE — Progress Notes (Signed)
[name] presents today for phlebotomy per MD orders. None indicated, Hct 40.8. Teola Bradley, Bernell Haynie [employer]

## 2012-05-28 NOTE — Progress Notes (Signed)
This office note has been dictated.

## 2012-05-29 NOTE — Progress Notes (Signed)
CC:   Willow Ora, MD  DIAGNOSES: 1. Polycythemia vera-JAK2 positive. 2. Stage I (T1b N0 M0) ductal carcinoma of the left breast (ER     positive.)  CURRENT THERAPY: 1. Hydrea 500 mg p.o. every other day. 2. Phlebotomy to maintain hematocrit less than 45%. 3. Femara 2.5 mg p.o. daily.  INTERIM HISTORY:  Mrs. Minnis comes in for followup.  She is doing okay.  We last saw her back in mid December.  She has had no problems with the Hydrea or the Femara.  We are going to actually take the Hydrea dose back to every other day now.  She has had no arthralgias or myalgias.  She is not taking vitamin D.  I told to take 2000 units of vitamin D daily.  She has had no rashes.  There has been no headache.  She has had no change in bowel or bladder habits.  She has not noticed any kind of cough.  We are making her iron-deficient.  Her ferritin back in December was 20 with iron saturation of 9%.  PHYSICAL EXAM:  General:  This is a elderly, but well-nourished white female in no obvious distress.  Vital signs:  Temperature of 97.3, pulse 98, respiratory rate 18, blood pressure 161/76.  Weight is 182.  Head and neck:  Normocephalic, atraumatic skull.  There are no ocular or oral lesions.  There are no palpable cervical or supraclavicular lymph nodes. Lungs:  Clear bilaterally.  Cardiac:  Regular rate and rhythm with a normal S1 and S2.  There are no murmurs, rubs, or bruits.  Abdomen: Soft with good bowel sounds.  There is no palpable abdominal mass. There is no palpable hepatosplenomegaly.  Back:  No tenderness over the spine, ribs, or hips.  Extremities:  No clubbing, cyanosis, or edema. Skin:  No rash, ecchymosis, or petechia.  LABORATORY STUDIES:  White cell count 6.3, hemoglobin 12.9, hematocrit 40.8, platelet count 304.  MCV is 91.  IMPRESSION:  [name] is an 79 year old white female with both polycythemia vera and breast cancer.  From my point of view, I think the polycythemia  is the more active of the problems.  Thankfully, she does not need to be phlebotomized today.  The patient says that she feels tired.  I told her this likely was because of her being iron deficient. This is what we want in patients with polycythemia so that they do not increase their blood count that could cause problems.  Again, I told to take the vitamin D 2000 units daily.  Hopefully, she will do this.  We will plan to get her back in another 2 months or so.  I do not see that we need any blood work in between visits.    ______________________________ Josph Macho, M.D. PRE/MEDQ  D:  05/28/2012  T:  05/29/2012  Job:  [id number]

## 2012-07-22 ENCOUNTER — Other Ambulatory Visit: Payer: Self-pay | Admitting: Medical

## 2012-07-22 DIAGNOSIS — D751 Secondary polycythemia: Secondary | ICD-10-CM

## 2012-07-23 ENCOUNTER — Ambulatory Visit: Payer: Medicare Other

## 2012-07-23 ENCOUNTER — Other Ambulatory Visit (HOSPITAL_BASED_OUTPATIENT_CLINIC_OR_DEPARTMENT_OTHER): Payer: Medicare Other | Admitting: Lab

## 2012-07-23 ENCOUNTER — Ambulatory Visit (HOSPITAL_BASED_OUTPATIENT_CLINIC_OR_DEPARTMENT_OTHER): Payer: Medicare Other | Admitting: Medical

## 2012-07-23 VITALS — BP 147/74 | HR 62 | Temp 97.5°F | Resp 16 | Ht 67.0 in | Wt 181.0 lb

## 2012-07-23 DIAGNOSIS — Z170 Estrogen receptor positive status [ER+]: Secondary | ICD-10-CM

## 2012-07-23 DIAGNOSIS — D45 Polycythemia vera: Secondary | ICD-10-CM

## 2012-07-23 DIAGNOSIS — D751 Secondary polycythemia: Secondary | ICD-10-CM

## 2012-07-23 DIAGNOSIS — C50912 Malignant neoplasm of unspecified site of left female breast: Secondary | ICD-10-CM

## 2012-07-23 DIAGNOSIS — C50919 Malignant neoplasm of unspecified site of unspecified female breast: Secondary | ICD-10-CM

## 2012-07-23 LAB — COMPREHENSIVE METABOLIC PANEL
ALT: 12 U/L (ref 0–35)
AST: 19 U/L (ref 0–37)
Albumin: 4.3 g/dL (ref 3.5–5.2)
Alkaline Phosphatase: 78 U/L (ref 39–117)
BUN: 7 mg/dL (ref 6–23)
CO2: 30 mEq/L (ref 19–32)
Calcium: 9.6 mg/dL (ref 8.4–10.5)
Chloride: 102 mEq/L (ref 96–112)
Creatinine, Ser: 0.78 mg/dL (ref 0.50–1.10)
Glucose, Bld: 96 mg/dL (ref 70–99)
Potassium: 4.3 mEq/L (ref 3.5–5.3)
Sodium: 139 mEq/L (ref 135–145)
Total Bilirubin: 1.5 mg/dL — ABNORMAL HIGH (ref 0.3–1.2)
Total Protein: 7.1 g/dL (ref 6.0–8.3)

## 2012-07-23 LAB — CBC WITH DIFFERENTIAL (CANCER CENTER ONLY)
BASO#: 0.1 10*3/uL (ref 0.0–0.2)
BASO%: 1.2 % (ref 0.0–2.0)
EOS%: 3.1 % (ref 0.0–7.0)
Eosinophils Absolute: 0.2 10*3/uL (ref 0.0–0.5)
HCT: 43.5 % (ref 34.8–46.6)
HGB: 13.5 g/dL (ref 11.6–15.9)
LYMPH#: 1.1 10*3/uL (ref 0.9–3.3)
LYMPH%: 21.1 % (ref 14.0–48.0)
MCH: 27.1 pg (ref 26.0–34.0)
MCHC: 31 g/dL — ABNORMAL LOW (ref 32.0–36.0)
MCV: 87 fL (ref 81–101)
MONO#: 0.6 10*3/uL (ref 0.1–0.9)
MONO%: 11 % (ref 0.0–13.0)
NEUT#: 3.3 10*3/uL (ref 1.5–6.5)
NEUT%: 63.6 % (ref 39.6–80.0)
Platelets: 399 10*3/uL (ref 145–400)
RBC: 4.99 10*6/uL (ref 3.70–5.32)
RDW: 14.2 % (ref 11.1–15.7)
WBC: 5.2 10*3/uL (ref 3.9–10.0)

## 2012-07-23 LAB — IRON AND TIBC
%SAT: 6 % — ABNORMAL LOW (ref 20–55)
Iron: 27 ug/dL — ABNORMAL LOW (ref 42–145)
TIBC: 466 ug/dL (ref 250–470)
UIBC: 439 ug/dL — ABNORMAL HIGH (ref 125–400)

## 2012-07-23 LAB — FERRITIN: Ferritin: 22 ng/mL (ref 10–291)

## 2012-07-23 NOTE — Progress Notes (Signed)
DIAGNOSES: 1. Polycythemia vera-JAK2 positive. 2. Stage I (T1b N0 M0) ductal carcinoma of the left breast (ER     positive.)  CURRENT THERAPY: 1. Hydrea 500 mg p.o. every other day. 2. Phlebotomy to maintain hematocrit less than 45%. 3. Femara 2.5 mg p.o. daily.  INTERIM HISTORY:  [name] presents today for an office followup visit.  Overall, she, reports, that she's doing quite well without any problems.  She remains on Hydrea.  Every other day.  She's also on Femara 2.5 mg by mouth daily.  She does not report any complications from these 2 medications.  She does not report any arthralgias or myalgias.  She's not reporting any nausea, vomiting, or diarrhea.  She has had noticed a rash on her chest, however, she, states, that that has been there for quite some time.  She does utilize lotion.  She, reports, a good appetite.  She denies any nausea, vomiting, diarrhea, constipation, chest pain, shortness breath, or cough.  Any fevers, chills, or night sweats.  She denies any mucositis.  She denies any lower leg swelling.  She denies any headaches or visual changes.  She denies any obvious, or abnormal bleeding.  We do like to keep her.  Iron deficient.  Her last iron panel revealed an iron of 36, with 8% saturation.  Her ferritin was 28.  Review of Systems: Constitutional:Negative for malaise/fatigue, fever, chills, weight loss, diaphoresis, activity change, appetite change, and unexpected weight change.  HEENT: Negative for double vision, blurred vision, visual loss, ear pain, tinnitus, congestion, rhinorrhea, epistaxis sore throat or sinus disease, oral pain/lesion, tongue soreness Respiratory: Negative for cough, chest tightness, shortness of breath, wheezing and stridor.  Cardiovascular: Negative for chest pain, palpitations, leg swelling, orthopnea, PND, DOE or claudication Gastrointestinal: Negative for nausea, vomiting, abdominal pain, diarrhea, constipation, blood in stool, melena,  hematochezia, abdominal distention, anal bleeding, rectal pain, anorexia and hematemesis.  Genitourinary: Negative for dysuria, frequency, hematuria,  Musculoskeletal: Negative for myalgias, back pain, joint swelling, arthralgias and gait problem.  Skin: Negative for rash, color change, pallor and wound.  Neurological:. Negative for dizziness/light-headedness, tremors, seizures, syncope, facial asymmetry, speech difficulty, weakness, numbness, headaches and paresthesias.  Hematological: Negative for adenopathy. Does not bruise/bleed easily.  Psychiatric/Behavioral:  Negative for depression, no loss of interest in normal activity or change in sleep pattern.   Physical Exam: This is an elderly, 79 year old, well-developed, well-nourished, white female, in no obvious distress Vitals: Temperature 97.5 degrees, pulse 62, respirations 16, blood pressure 147/74.  Weight 181 pounds HEENT reveals a normocephalic, atraumatic skull, no scleral icterus, no oral lesions  Neck is supple without any cervical or supraclavicular adenopathy.  Lungs are clear to auscultation bilaterally. There are no wheezes, rales or rhonci Cardiac is regular rate and rhythm with a normal S1 and S2. There are no murmurs, rubs, or bruits.  Abdomen is soft with good bowel sounds, there is no palpable mass. There is no palpable hepatosplenomegaly. There is no palpable fluid wave.  Musculoskeletal no tenderness of the spine, ribs, or hips.  Extremities there are no clubbing, cyanosis, or edema.  Skin no petechia, purpura or ecchymosis Neurologic is nonfocal.  Laboratory Data: White count 5.2, hemoglobin 13.5, hematocrit 43.5.  Platelets 399,000  Current Outpatient Prescriptions on File Prior to Visit  Medication Sig Dispense Refill  . acetaminophen-codeine (TYLENOL #3) 300-30 MG per tablet Take 1 tablet by mouth every 8 (eight) hours as needed. For migraine      . aspirin 81 MG tablet Take  162 mg by mouth daily.      .  hydrochlorothiazide (HYDRODIURIL) 25 MG tablet Take 1 tablet (25 mg total) by mouth daily as needed (swelling in the legs ).  30 tablet  1  . hydroxyurea (HYDREA) 500 MG capsule Take 500 mg by mouth daily. May take with food to minimize GI side effects.      Marland Kitchen letrozole (FEMARA) 2.5 MG tablet Take 2.5 mg by mouth daily.      Marland Kitchen LORazepam (ATIVAN) 0.5 MG tablet Take 1 tablet (0.5 mg total) by mouth 2 (two) times daily as needed for anxiety.  30 tablet  5  . metoprolol (LOPRESSOR) 50 MG tablet Take 0.5 tablets (25 mg total) by mouth 2 (two) times daily.  90 tablet  1  . omeprazole (PRILOSEC) 40 MG capsule Take 20 mg by mouth daily.       . rosuvastatin (CRESTOR) 5 MG tablet Take 2.5 mg by mouth daily. Take one every other day      . Simethicone (GAS-X PO) Take 1 tablet by mouth daily as needed. For stomach pain.      . vitamin E 400 UNIT capsule Take 400 Units by mouth daily.        No current facility-administered medications on file prior to visit.   Assessment/Plan: This is a pleasant, 79 year old, white female, with the following issues:  #1.  Polycythemia vera.  Again, she is JAK2 positive.  She will remain on Hydrea every other day.  She will not need a phlebotomy today.  We do like to keep her hematocrit below 45%.  #2.  Stage I ductal carcinoma of the left breast.  She is ER positive.  She remains on Femara 2.5 mg by mouth daily .  Without any problems.  #3.  Followup.  We will follow back up with [name] in 2 months, but before then should there be questions or concerns.

## 2012-07-23 NOTE — Progress Notes (Signed)
Patient seen by Dawn Blase, PA. No phlebotomy indicated today, HCT  43.5. [name], [name] [employer]

## 2012-07-24 ENCOUNTER — Other Ambulatory Visit: Payer: Self-pay | Admitting: Medical

## 2012-07-28 ENCOUNTER — Telehealth: Payer: Self-pay | Admitting: Internal Medicine

## 2012-07-28 NOTE — Telephone Encounter (Signed)
Please advise 

## 2012-07-28 NOTE — Telephone Encounter (Signed)
Discussed with pt, schedule appt  3.18.14.

## 2012-07-28 NOTE — Telephone Encounter (Signed)
Patient Information:  Caller Name: Reid  Phone: [phone]  Patient: [name], [name]  Gender: Female  DOB: [birth year]  Age: 79 Years  PCP: Willow Ora  Office Follow Up:  Does the office need to follow up with this patient?: Yes  Instructions For The Office: See RN notes  RN Note:  Patient wants to know if Cipro or another antibiotic may be called in because this is the same pain she has had in the past. Please call her back and let her know if she needs to schedule an office visit as suggested.  Symptoms  Reason For Call & Symptoms: Reports abdominal pain in the left lower quadrant. Reports she's had this in the past and has had to take Cipro. She is asking if she could take that again for the pain.  Reviewed Health History In EMR: Yes  Reviewed Medications In EMR: Yes  Reviewed Allergies In EMR: Yes  Reviewed Surgeries / Procedures: Yes  Date of Onset of Symptoms: 07/07/2012  Guideline(s) Used:  Abdominal Pain - Female  Disposition Per Guideline:   See Today in Office  Reason For Disposition Reached:   Moderate or mild pain that comes and goes (cramps) lasts > 24 hours  Advice Given:  Call Back If:  Abdominal pain is constant and present for more than 2 hours  You become worse.  Patient Refused Recommendation:  Patient Requests Prescription  Requests prescription for Cipro or another antibiotic.

## 2012-07-28 NOTE — Telephone Encounter (Signed)
Can't prescribe antibiotics based on the information provided. Please arrange a office visit tomorrow morning. ER if fever, chills, blood in the stools, moderate to severe pain or symptoms are getting worse.

## 2012-07-29 ENCOUNTER — Ambulatory Visit: Payer: Medicare Other | Admitting: Internal Medicine

## 2012-08-08 ENCOUNTER — Telehealth: Payer: Self-pay | Admitting: Internal Medicine

## 2012-08-08 MED ORDER — LORAZEPAM 0.5 MG PO TABS: 0.5000 mg | ORAL_TABLET | Freq: Two times a day (BID) | ORAL | Status: DC | PRN

## 2012-08-08 NOTE — Telephone Encounter (Signed)
done

## 2012-08-08 NOTE — Telephone Encounter (Signed)
Faxed rx

## 2012-08-08 NOTE — Telephone Encounter (Signed)
Faxed received from pharmacy stating that the pt is requesting a refill for lorazepam 0.5mg  for a quantity of 60 tablets instead of 30 tablets. OK to refill #60?

## 2012-08-14 ENCOUNTER — Ambulatory Visit (INDEPENDENT_AMBULATORY_CARE_PROVIDER_SITE_OTHER): Payer: Medicare Other | Admitting: General Surgery

## 2012-08-14 ENCOUNTER — Encounter (INDEPENDENT_AMBULATORY_CARE_PROVIDER_SITE_OTHER): Payer: Self-pay | Admitting: General Surgery

## 2012-08-14 VITALS — BP 132/82 | HR 80 | Temp 97.8°F | Resp 18 | Ht 67.5 in | Wt 179.0 lb

## 2012-08-14 DIAGNOSIS — Z853 Personal history of malignant neoplasm of breast: Secondary | ICD-10-CM

## 2012-08-14 NOTE — Patient Instructions (Signed)

## 2012-08-14 NOTE — Progress Notes (Signed)
Subjective:     Patient ID: [name], female   DOB: 1929-08-17, 79 y.o.   MRN: [id number]  HPI This is a 79 year old female with polycythemia vera who also presented with a left breast cancer. She underwent a lumpectomy for a stage I left breast cancer that is hormone receptor positive. She has done well since then. She is being followed by Dr. Myna Hidalgo for polycythemia and gets phlebotomized intermittently. This is going fairly well. She is tolerating the Femara well. She does have some occasional complaints of bloating but not sure exactly what these are from. She is aware complaints referable to either breast. She comes back in today for exam.  Review of Systems     Objective:   Physical Exam  Vitals reviewed. Constitutional: She appears well-developed and well-nourished.  Pulmonary/Chest: Right breast exhibits no inverted nipple, no mass, no nipple discharge, no skin change and no tenderness. Left breast exhibits no inverted nipple, no mass, no nipple discharge, no skin change and no tenderness. Breasts are symmetrical.    Lymphadenopathy:    She has no cervical adenopathy.    She has no axillary adenopathy.       Right: No supraclavicular adenopathy present.       Left: No supraclavicular adenopathy present.       Assessment:     Stage I breast cancer    Plan:     She has no clinical evidence of recurrence. She is due to get her mammogram in May. She is on the callback list from the breast center. She's going to continue her self exams, mammography a schedule, an every six-month clinical exams. I can see her annual this point. She also asked about a nevus on the scalp as well as a little keratoses that looks like is underneath her right eye. I told her that the best procedure for that would be her dermatologist Dr. Danella Deis

## 2012-08-15 ENCOUNTER — Encounter: Payer: Self-pay | Admitting: Lab

## 2012-08-18 ENCOUNTER — Ambulatory Visit (INDEPENDENT_AMBULATORY_CARE_PROVIDER_SITE_OTHER): Payer: Medicare Other | Admitting: Internal Medicine

## 2012-08-18 ENCOUNTER — Encounter: Payer: Self-pay | Admitting: Internal Medicine

## 2012-08-18 VITALS — BP 126/78 | HR 65 | Temp 97.9°F | Ht 66.0 in | Wt 181.0 lb

## 2012-08-18 DIAGNOSIS — E559 Vitamin D deficiency, unspecified: Secondary | ICD-10-CM

## 2012-08-18 DIAGNOSIS — I10 Essential (primary) hypertension: Secondary | ICD-10-CM | POA: Diagnosis not present

## 2012-08-18 DIAGNOSIS — Z23 Encounter for immunization: Secondary | ICD-10-CM | POA: Diagnosis not present

## 2012-08-18 DIAGNOSIS — R609 Edema, unspecified: Secondary | ICD-10-CM | POA: Diagnosis not present

## 2012-08-18 DIAGNOSIS — F341 Dysthymic disorder: Secondary | ICD-10-CM

## 2012-08-18 DIAGNOSIS — M810 Age-related osteoporosis without current pathological fracture: Secondary | ICD-10-CM

## 2012-08-18 DIAGNOSIS — Z0000 Encounter for general adult medical examination without abnormal findings: Secondary | ICD-10-CM | POA: Diagnosis not present

## 2012-08-18 DIAGNOSIS — R269 Unspecified abnormalities of gait and mobility: Secondary | ICD-10-CM | POA: Diagnosis not present

## 2012-08-18 DIAGNOSIS — E785 Hyperlipidemia, unspecified: Secondary | ICD-10-CM | POA: Diagnosis not present

## 2012-08-18 MED ORDER — ZOSTER VACCINE LIVE 19400 UNT/0.65ML ~~LOC~~ SOLR: 0.6500 mL | Freq: Once | SUBCUTANEOUS | Status: DC

## 2012-08-18 NOTE — Assessment & Plan Note (Signed)
Doing well except for a single fall few months ago, she was walking on uneven surface. See instructions.

## 2012-08-18 NOTE — Addendum Note (Signed)
Addended by: Willow Ora E on: 08/18/2012 06:30 PM   Modules accepted: Orders

## 2012-08-18 NOTE — Assessment & Plan Note (Signed)
Well-controlled with Ativan as needed

## 2012-08-18 NOTE — Assessment & Plan Note (Signed)
Well controlled, recent BMP ok, no change

## 2012-08-18 NOTE — Assessment & Plan Note (Signed)
goos compliance w/ crestor, recent LFTs normal. Check a FLP

## 2012-08-18 NOTE — Assessment & Plan Note (Addendum)
Last dexa 4-10 -----> Took fosamax temporarily in 2011. Currently on  vit d supplements, rec Ca as well H/o vertebral fx Discussed dexa --- will order  H/o low vit d : labs

## 2012-08-18 NOTE — Assessment & Plan Note (Addendum)
Td 6-07 Pneumonia shot today zostavax pt reluctant to get; also will send a message to hematology to see if is ok to get the shot--- addendum, per hematology immunosuppression is minimal thus will print a Rx for zostavax and mail to pt, to get at her convenience  Female care  Has not seen  Gyn  In a while , no h/o abnormal PAPs Cscope 2003: polyps  Dr Russella Dar,  repeated Cscope   07-2008 had mild diverticulosis MMG-- dx breast cancer 2013 , per oncology  Encouraged to remain active and eat healthy

## 2012-08-18 NOTE — Assessment & Plan Note (Signed)
Takes hydrochlorothiazide from time to time, currently not an issue

## 2012-08-18 NOTE — Patient Instructions (Signed)

## 2012-08-18 NOTE — Progress Notes (Signed)
Subjective:    Patient ID: [name], female    DOB: [birth year], 79 y.o.   MRN: [id number]  HPI Here for Medicare AWV: 1. Risk factors based on Past M, S, F history: reviewed 2. Physical Activities:  [facility], takes indoor walks sometimes  3. Depression/mood:  (-) screening 4. Hearing: mild impairment? No problems w/ normal conversation, not interested in further eval  5. ADL's:  Still drives, completely independent  6. Fall Risk: mechanical fall x1, see instructions 7. home Safety: does feel safe at home  8. Height, weight, &visual acuity: see VS, saw the eye doctor 05-2012 9. Counseling: provided 10. Labs ordered based on risk factors: if needed  11. Referral Coordination: if needed 12.  Care Plan, see assessment and plan  13.  Cognitive Assessment: motor and cognition appropriate for age   In addition, today we discussed the following: Hypertension, good medication compliance, no recent ambulatory BPs but BP is checked at doctors appointments and is usually normal. High cholesterol, taking Crestor every other day, no apparent side effects such as myalgias. Osteoporosis, good compliance with vitamin D, not taking calcium supplements. Polycythemia, follow up closely by oncology,doing well. .   Past Medical History:   Headache - migraines, codeine prn   Hypertension   Hyperlipidemia   Osteoporosis   Oncology Breast cancer 10-2011 Polycythemia HEART, CV:  --Mild nonobstructive disease on cath in 2003.   --Probable Takotsubo cardiomyopathy: Severe chest pain with normal cath in 1994. Severe chest pain in 2003 with widespread T wave inversions on ECG. Cath with minimal coronary disease but LV-gram showed periapical severe hypokinesis and basilar hyperkinesia (EF 40%). Last echo in 4/09 confirmed full LV functional recovery with EF 60%, no regional wall motion abnormalities, mild to moderate LVH.   H/O CVA    ----------------------------------------------   GI:  GERD Hiatal hernia   Hemorrhoids   Hyperplastic colon polyp, 06/2001   Functional LLQ pain   h/o diverticuli   -----------------------------------------------   Obstructive sleep apnea   Varicose veins   h/o R leg DVT after a venous ablation   OA with bilateral knee pain   4-09 L2 fracture status post vertebroplasty of L2 September 10, 2007 performed by IR   Depression/anxiety -lost husband    Past Surgical History:   Appendectomy   left ovarian cyst   3/07 - stress test neg   Tonsillectomy   Back Surgery   Tubal Ligation   Dental extraction L maxillary molar   Kyphoplasty 08/2007   left total knee replacement 06/2010   Cholecystectomy, 03-2011  L lumpectomy 11-2011  Social History: Married, lost husband 07-29-07.  Lives in [place]. lives in a town house, her daughter- GS live w/ her  driving   Former Smoker: quit 1990 Alcohol use-yes socially  children x 3 ( 2 in [place])  Family History  Problem Relation Age of Onset  . Colon polyps      cousin  . Colon cancer      cousin  . Breast cancer Mother   . Breast cancer Sister   . Heart disease Father     MIs  . Leukemia Brother     GM    Review of Systems  Respiratory: Negative for cough and shortness of breath.   Cardiovascular: Negative for chest pain and leg swelling.  Gastrointestinal: Negative for abdominal pain and blood in stool.  Genitourinary: Negative for hematuria and difficulty urinating.       Objective:  Physical Exam General -- alert, well-developed, no apparent distress   Neck --no thyromegaly Lungs -- normal respiratory effort, no intercostal retractions, no accessory muscle use, and normal breath sounds.   Heart-- normal rate, regular rhythm, no murmur, and no gallop.   Abdomen--soft, non-tender, no distention, no masses, no HSM, no guarding, and no rigidity.   Extremities-- no pretibial edema bilaterally Neurologic-- alert & oriented X3 and  strength normal in all extremities. Psych-- Cognition and judgment appear intact. Alert and cooperative with normal attention span and concentration.  not anxious appearing and not depressed appearing.      Assessment & Plan:

## 2012-08-19 ENCOUNTER — Telehealth: Payer: Self-pay | Admitting: *Deleted

## 2012-08-19 NOTE — Telephone Encounter (Signed)
Rest, fluids, Tylenol. If she develops cough: Take Robitussin-DM. If symptoms increase, high fever, shortness of breath: Needs to be seen or go to an urgent care.

## 2012-08-19 NOTE — Telephone Encounter (Signed)
Left detailed msg on pt's vmail.  

## 2012-08-19 NOTE — Telephone Encounter (Signed)
Pt left msg on vmail stating that she woke up this morning with a sore throat & feels awful. Pt states that she did not sleep well last night & would like to know what she should do. Please advise.

## 2012-08-20 ENCOUNTER — Telehealth: Payer: Self-pay | Admitting: *Deleted

## 2012-08-20 ENCOUNTER — Encounter: Payer: Self-pay | Admitting: *Deleted

## 2012-08-20 ENCOUNTER — Telehealth: Payer: Self-pay | Admitting: Internal Medicine

## 2012-08-20 DIAGNOSIS — Z79899 Other long term (current) drug therapy: Secondary | ICD-10-CM | POA: Diagnosis not present

## 2012-08-20 LAB — LIPID PANEL
Cholesterol: 184 mg/dL (ref 0–200)
HDL: 47.8 mg/dL (ref >39.00)
LDL Cholesterol: 121 mg/dL — ABNORMAL HIGH (ref 0–99)
Total CHOL/HDL Ratio: 4
Triglycerides: 77 mg/dL (ref 0.0–149.0)
VLDL: 15.4 mg/dL (ref 0.0–40.0)

## 2012-08-20 NOTE — Telephone Encounter (Signed)
error 

## 2012-08-20 NOTE — Telephone Encounter (Signed)
Recommend to put a cold compress on the side of the pneumonia shot. If they URI is not getting better she needs to be seen. Please arrange

## 2012-08-20 NOTE — Telephone Encounter (Signed)
Spoke to pt today, she states that her arm that she had her pneumonia shot had some redness & was very sore. Pt states that she is still not feeling well & is continuing to have a sore throat & cough. Pt states that she did have a low grade fever last night. Please advise.

## 2012-08-20 NOTE — Telephone Encounter (Signed)
Discussed with pt

## 2012-08-21 ENCOUNTER — Ambulatory Visit (INDEPENDENT_AMBULATORY_CARE_PROVIDER_SITE_OTHER): Payer: Medicare Other | Admitting: Internal Medicine

## 2012-08-21 VITALS — BP 128/72 | HR 72 | Temp 98.2°F | Wt 184.0 lb

## 2012-08-21 DIAGNOSIS — T7840XA Allergy, unspecified, initial encounter: Secondary | ICD-10-CM | POA: Diagnosis not present

## 2012-08-21 DIAGNOSIS — R059 Cough, unspecified: Secondary | ICD-10-CM | POA: Diagnosis not present

## 2012-08-21 DIAGNOSIS — R05 Cough: Secondary | ICD-10-CM

## 2012-08-21 MED ORDER — DOXYCYCLINE HYCLATE 100 MG PO TABS: 100.0000 mg | ORAL_TABLET | Freq: Two times a day (BID) | ORAL | Status: DC

## 2012-08-21 MED ORDER — ACETAMINOPHEN-CODEINE #3 300-30 MG PO TABS: 1.0000 | ORAL_TABLET | Freq: Three times a day (TID) | ORAL | Status: DC | PRN

## 2012-08-21 NOTE — Progress Notes (Signed)
  Subjective:    Patient ID: [name], female    DOB: 1929/11/04, 79 y.o.   MRN: [id number]  HPI Acute visit 4 days ago developed sore throat, getting progressively worse. The next day, she developed cough which was initially intense and cause her "ribs to be sore". Cough is slightly better now. Also, developed redness around the area of the pneumonia shot few days ago. The area is not getting worse or better in the last 48 hours. Past Medical History:   Headache - migraines, codeine prn   Hypertension   Hyperlipidemia   Osteoporosis   Oncology Breast cancer 10-2011 Polycythemia HEART, CV:  --Mild nonobstructive disease on cath in 2003.   --Probable Takotsubo cardiomyopathy: Severe chest pain with normal cath in 1994. Severe chest pain in 2003 with widespread T wave inversions on ECG. Cath with minimal coronary disease but LV-gram showed periapical severe hypokinesis and basilar hyperkinesia (EF 40%). Last echo in 4/09 confirmed full LV functional recovery with EF 60%, no regional wall motion abnormalities, mild to moderate LVH.   H/O CVA   ----------------------------------------------   GI:  GERD Hiatal hernia   Hemorrhoids   Hyperplastic colon polyp, 06/2001   Functional LLQ pain   h/o diverticuli   -----------------------------------------------   Obstructive sleep apnea   Varicose veins   h/o R leg DVT after a venous ablation   OA with bilateral knee pain   4-09 L2 fracture status post vertebroplasty of L2 September 10, 2007 performed by IR   Depression/anxiety -lost husband    Past Surgical History:   Appendectomy   left ovarian cyst   3/07 - stress test neg   Tonsillectomy   Back Surgery   Tubal Ligation   Dental extraction L maxillary molar   Kyphoplasty 08/2007   left total knee replacement 06/2010   Cholecystectomy, 03-2011   L lumpectomy 11-2011  Social History: Married, lost husband 07-29-07.  Lives in [place]. lives in a town house, her daughter- GS live  w/ her  driving   Former Smoker: quit 1990 Alcohol use-yes socially   children x 3 ( 2 in [place])  Review of Systems Denies fever or chills. In the last 2 days vomited 3 times due to cough. No sinus pain or nasal discharge. No myalgias.No headaches. Mild, clear sputum production. Other than feeling tired and coughing she does not feel bad. Did have  mild shortness or breath.    Objective:   Physical Exam  Skin:       General -- alert, well-developed,VS stable, nontoxic appearing. HEENT --  throat without redness , nose congested without discharge  Lungs -- normal respiratory effort, no intercostal retractions, no accessory muscle use, and  few rhonchi with cough, no wheezing. Heart-- normal rate, regular rhythm, no murmur, and no gallop.   Extremities-- no pretibial edema bilaterally  Psych-- Cognition and judgment appear intact. Alert and cooperative with normal attention span and concentration.  not anxious appearing and not depressed appearing.      Assessment & Plan: Allergic reaction to   Cough, likely mild bronchitis. See instructions. Local reaction to pneumonia shot, see instructions. (If there is cellulitis, doxycycline should cover that as well.)

## 2012-08-21 NOTE — Patient Instructions (Addendum)
Rest, fluids , tylenol For cough, take Mucinex DM twice a day as needed  If the cough continue, use the tylenol with codeine   Take the antibiotic as prescribed  (doxycycline) x 1 week Call if no better in few days Call anytime if the symptoms are severe -- Put a cold compress at the R arm , call if the redness is getting worse or not gone in 1 week

## 2012-08-22 ENCOUNTER — Encounter: Payer: Self-pay | Admitting: Internal Medicine

## 2012-08-22 LAB — VITAMIN D 1,25 DIHYDROXY
Vitamin D 1, 25 (OH)2 Total: 46 pg/mL (ref 18–72)
Vitamin D2 1, 25 (OH)2: <8 pg/mL
Vitamin D3 1, 25 (OH)2: 46 pg/mL

## 2012-09-11 ENCOUNTER — Encounter: Payer: Self-pay | Admitting: Internal Medicine

## 2012-09-17 ENCOUNTER — Other Ambulatory Visit (INDEPENDENT_AMBULATORY_CARE_PROVIDER_SITE_OTHER): Payer: Self-pay | Admitting: General Surgery

## 2012-09-17 DIAGNOSIS — Z853 Personal history of malignant neoplasm of breast: Secondary | ICD-10-CM

## 2012-09-19 ENCOUNTER — Other Ambulatory Visit: Payer: Self-pay | Admitting: *Deleted

## 2012-09-19 MED ORDER — HYDROXYUREA 500 MG PO CAPS: 500.0000 mg | ORAL_CAPSULE | ORAL | Status: DC

## 2012-09-22 ENCOUNTER — Ambulatory Visit (INDEPENDENT_AMBULATORY_CARE_PROVIDER_SITE_OTHER): Payer: Medicare Other | Admitting: Family Medicine

## 2012-09-22 ENCOUNTER — Encounter: Payer: Self-pay | Admitting: Family Medicine

## 2012-09-22 VITALS — BP 160/88 | HR 81 | Temp 98.9°F | Wt 180.8 lb

## 2012-09-22 DIAGNOSIS — R109 Unspecified abdominal pain: Secondary | ICD-10-CM

## 2012-09-22 DIAGNOSIS — R197 Diarrhea, unspecified: Secondary | ICD-10-CM

## 2012-09-22 LAB — POCT URINALYSIS DIPSTICK
Blood, UA: NEGATIVE
Glucose, UA: NEGATIVE
Ketones, UA: NEGATIVE
Nitrite, UA: NEGATIVE
Spec Grav, UA: 1.015
Urobilinogen, UA: 0.2
pH, UA: 5

## 2012-09-22 MED ORDER — METRONIDAZOLE 500 MG PO TABS: 500.0000 mg | ORAL_TABLET | Freq: Three times a day (TID) | ORAL | Status: DC

## 2012-09-22 MED ORDER — CIPROFLOXACIN HCL 500 MG PO TABS: 500.0000 mg | ORAL_TABLET | Freq: Two times a day (BID) | ORAL | Status: DC

## 2012-09-22 NOTE — Progress Notes (Signed)
  Subjective:     [name] is a 79 y.o. female who presents for evaluation of abdominal pain. Onset was several weeks ago. Symptoms have been gradually worsening. The pain is described as cramping and sharp, and is 6/10 in intensity. Pain is located in the LLQ without radiation.  Aggravating factors: activity.  Alleviating factors: none. Associated symptoms: diarrhea and nausea. The patient denies anorexia, arthralagias, belching, chills, constipation, dysuria, fever, flatus, frequency, headache, hematochezia, hematuria, melena, myalgias, sweats and vomiting.  The patient's history has been marked as reviewed and updated as appropriate.  Review of Systems Pertinent items are noted in HPI.     Objective:    BP 160/88  Pulse 81  Temp(Src) 98.9 F (37.2 C) (Oral)  Wt 180 lb 12.8 oz (82.01 kg)  BMI 29.2 kg/m2  SpO2 99% General appearance: alert, cooperative, appears stated age and no distress Nose: Nares normal. Septum midline. Mucosa normal. No drainage or sinus tenderness. Throat: lips, mucosa, and tongue normal; teeth and gums normal Neck: no adenopathy, supple, symmetrical, trachea midline and thyroid not enlarged, symmetric, no tenderness/mass/nodules Lungs: clear to auscultation bilaterally Abdomen: abnormal findings:  moderate tenderness in the LLQ    Assessment:    Abdominal pain, likely secondary to ? diverticulitis .    Plan:    The diagnosis was discussed with the patient and evaluation and treatment plans outlined. See orders for lab and imaging studies. Adhere to simple, bland diet. Further follow-up plans will be based on outcome of lab/imaging studies; see orders. Follow up in 2 days or as needed. -- or go to ER if symptoms perisist or worsen Flagyl and cipro  Stool cultures abx per order

## 2012-09-22 NOTE — Patient Instructions (Addendum)
Abdominal Pain  Abdominal pain can be caused by many things. Your caregiver decides the seriousness of your pain by an examination and possibly blood tests and X-rays. Many cases can be observed and treated at home. Most abdominal pain is not caused by a disease and will probably improve without treatment. However, in many cases, more time must pass before a clear cause of the pain can be found. Before that point, it may not be known if you need more testing, or if hospitalization or surgery is needed.  HOME CARE INSTRUCTIONS   · Do not take laxatives unless directed by your caregiver.  · Take pain medicine only as directed by your caregiver.  · Only take over-the-counter or prescription medicines for pain, discomfort, or fever as directed by your caregiver.  · Try a clear liquid diet (broth, tea, or water) for as long as directed by your caregiver. Slowly move to a bland diet as tolerated.  SEEK IMMEDIATE MEDICAL CARE IF:   · The pain does not go away.  · You have a fever.  · You keep throwing up (vomiting).  · The pain is felt only in portions of the abdomen. Pain in the right side could possibly be appendicitis. In an adult, pain in the left lower portion of the abdomen could be colitis or diverticulitis.  · You pass bloody or black tarry stools.  MAKE SURE YOU:   · Understand these instructions.  · Will watch your condition.  · Will get help right away if you are not doing well or get worse.  Document Released: 02/07/2005 Document Revised: 07/23/2011 Document Reviewed: 12/17/2007  [employer]® Patient Information ©2013 [employer].

## 2012-09-23 ENCOUNTER — Ambulatory Visit (INDEPENDENT_AMBULATORY_CARE_PROVIDER_SITE_OTHER)
Admission: RE | Admit: 2012-09-23 | Discharge: 2012-09-23 | Disposition: A | Discharge status: Home / Self Care | Payer: Medicare Other | Source: Ambulatory Visit | Attending: Family Medicine | Admitting: Family Medicine

## 2012-09-23 DIAGNOSIS — R109 Unspecified abdominal pain: Secondary | ICD-10-CM | POA: Diagnosis not present

## 2012-09-23 DIAGNOSIS — R1032 Left lower quadrant pain: Secondary | ICD-10-CM | POA: Diagnosis not present

## 2012-09-23 LAB — CBC WITH DIFFERENTIAL/PLATELET
Basophils Absolute: 0.4 10*3/uL — ABNORMAL HIGH (ref 0.0–0.1)
Basophils Relative: 5.1 % — ABNORMAL HIGH (ref 0.0–3.0)
Eosinophils Absolute: 0.3 10*3/uL (ref 0.0–0.7)
Eosinophils Relative: 3.2 % (ref 0.0–5.0)
HCT: 42.4 % (ref 36.0–46.0)
Hemoglobin: 13.8 g/dL (ref 12.0–15.0)
Lymphocytes Relative: 15.9 % (ref 12.0–46.0)
Lymphs Abs: 1.2 10*3/uL (ref 0.7–4.0)
MCHC: 32.6 g/dL (ref 30.0–36.0)
MCV: 79.8 fl (ref 78.0–100.0)
Monocytes Absolute: 0.5 10*3/uL (ref 0.1–1.0)
Monocytes Relative: 6.6 % (ref 3.0–12.0)
Neutro Abs: 5.4 10*3/uL (ref 1.4–7.7)
Neutrophils Relative %: 69.2 % (ref 43.0–77.0)
Platelets: 459 10*3/uL — ABNORMAL HIGH (ref 150.0–400.0)
RBC: 5.32 Mil/uL — ABNORMAL HIGH (ref 3.87–5.11)
RDW: 16.4 % — ABNORMAL HIGH (ref 11.5–14.6)
WBC: 7.9 10*3/uL (ref 4.5–10.5)

## 2012-09-23 LAB — BASIC METABOLIC PANEL
BUN: 8 mg/dL (ref 6–23)
CO2: 28 mEq/L (ref 19–32)
Calcium: 9.4 mg/dL (ref 8.4–10.5)
Chloride: 102 mEq/L (ref 96–112)
Creatinine, Ser: 0.8 mg/dL (ref 0.4–1.2)
GFR: 74.21 mL/min (ref >60.00)
Glucose, Bld: 117 mg/dL — ABNORMAL HIGH (ref 70–99)
Potassium: 4 mEq/L (ref 3.5–5.1)
Sodium: 138 mEq/L (ref 135–145)

## 2012-09-23 LAB — HEPATIC FUNCTION PANEL
ALT: 15 U/L (ref 0–35)
AST: 27 U/L (ref 0–37)
Albumin: 4 g/dL (ref 3.5–5.2)
Alkaline Phosphatase: 85 U/L (ref 39–117)
Bilirubin, Direct: 0.2 mg/dL (ref 0.0–0.3)
Total Bilirubin: 1.4 mg/dL — ABNORMAL HIGH (ref 0.3–1.2)
Total Protein: 7.2 g/dL (ref 6.0–8.3)

## 2012-09-23 MED ORDER — IOHEXOL 300 MG/ML  SOLN
100.0000 mL | Freq: Once | INTRAMUSCULAR | Status: AC | PRN
  Administered 2012-09-23: 100 mL via INTRAVENOUS

## 2012-09-24 ENCOUNTER — Ambulatory Visit: Payer: Medicare Other

## 2012-09-24 ENCOUNTER — Ambulatory Visit (HOSPITAL_BASED_OUTPATIENT_CLINIC_OR_DEPARTMENT_OTHER): Payer: Medicare Other | Admitting: Medical

## 2012-09-24 ENCOUNTER — Other Ambulatory Visit (HOSPITAL_BASED_OUTPATIENT_CLINIC_OR_DEPARTMENT_OTHER): Payer: Medicare Other | Admitting: Lab

## 2012-09-24 VITALS — BP 140/57 | HR 73 | Temp 98.0°F | Resp 16 | Ht 66.0 in | Wt 182.0 lb

## 2012-09-24 DIAGNOSIS — D45 Polycythemia vera: Secondary | ICD-10-CM | POA: Diagnosis not present

## 2012-09-24 DIAGNOSIS — D751 Secondary polycythemia: Secondary | ICD-10-CM

## 2012-09-24 DIAGNOSIS — C50919 Malignant neoplasm of unspecified site of unspecified female breast: Secondary | ICD-10-CM

## 2012-09-24 DIAGNOSIS — Z170 Estrogen receptor positive status [ER+]: Secondary | ICD-10-CM | POA: Diagnosis not present

## 2012-09-24 LAB — CBC WITH DIFFERENTIAL (CANCER CENTER ONLY)
BASO#: 0 10*3/uL (ref 0.0–0.2)
BASO%: 0.6 % (ref 0.0–2.0)
EOS%: 2.9 % (ref 0.0–7.0)
Eosinophils Absolute: 0.2 10*3/uL (ref 0.0–0.5)
HCT: 42.4 % (ref 34.8–46.6)
HGB: 13.1 g/dL (ref 11.6–15.9)
LYMPH#: 1.1 10*3/uL (ref 0.9–3.3)
LYMPH%: 15.5 % (ref 14.0–48.0)
MCH: 25.8 pg — ABNORMAL LOW (ref 26.0–34.0)
MCHC: 30.9 g/dL — ABNORMAL LOW (ref 32.0–36.0)
MCV: 84 fL (ref 81–101)
MONO#: 0.6 10*3/uL (ref 0.1–0.9)
MONO%: 9.3 % (ref 0.0–13.0)
NEUT#: 4.9 10*3/uL (ref 1.5–6.5)
NEUT%: 71.7 % (ref 39.6–80.0)
Platelets: 435 10*3/uL — ABNORMAL HIGH (ref 145–400)
RBC: 5.08 10*6/uL (ref 3.70–5.32)
RDW: 15.3 % (ref 11.1–15.7)
WBC: 6.8 10*3/uL (ref 3.9–10.0)

## 2012-09-24 LAB — COMPREHENSIVE METABOLIC PANEL
ALT: 10 U/L (ref 0–35)
AST: 17 U/L (ref 0–37)
Albumin: 4.1 g/dL (ref 3.5–5.2)
Alkaline Phosphatase: 92 U/L (ref 39–117)
BUN: 7 mg/dL (ref 6–23)
CO2: 29 mEq/L (ref 19–32)
Calcium: 9.5 mg/dL (ref 8.4–10.5)
Chloride: 101 mEq/L (ref 96–112)
Creatinine, Ser: 0.8 mg/dL (ref 0.50–1.10)
Glucose, Bld: 100 mg/dL — ABNORMAL HIGH (ref 70–99)
Potassium: 4.3 mEq/L (ref 3.5–5.3)
Sodium: 136 mEq/L (ref 135–145)
Total Bilirubin: 1.2 mg/dL (ref 0.3–1.2)
Total Protein: 6.7 g/dL (ref 6.0–8.3)

## 2012-09-24 LAB — IRON AND TIBC
%SAT: 7 % — ABNORMAL LOW (ref 20–55)
Iron: 29 ug/dL — ABNORMAL LOW (ref 42–145)
TIBC: 440 ug/dL (ref 250–470)
UIBC: 411 ug/dL — ABNORMAL HIGH (ref 125–400)

## 2012-09-24 LAB — URINE CULTURE
Colony Count: NO GROWTH
Organism ID, Bacteria: NO GROWTH

## 2012-09-24 LAB — FERRITIN: Ferritin: 17 ng/mL (ref 10–291)

## 2012-09-24 LAB — CLOSTRIDIUM DIFFICILE EIA: CDIFTX: NEGATIVE

## 2012-09-24 NOTE — Progress Notes (Signed)
DIAGNOSES: 1. Polycythemia vera-JAK2 positive. 2. Stage I (T1b N0 M0) ductal carcinoma of the left breast (ER     positive.)  CURRENT THERAPY: 1. Hydrea 500 mg p.o. every other day. 2. Phlebotomy to maintain hematocrit less than 45%. 3. Femara 2.5 mg p.o. daily.  INTERIM HISTORY:  [name] presents today for an office followup visit.  Overall, she, reports, that she's doing quite well without any problems.  She remains on Hydrea every other day.  She's also on Femara 2.5 mg by mouth daily.  She does not report any complications from these 2 medications.  She does not report any arthralgias or myalgias.  She's not reporting any nausea, vomiting, or diarrhea.  She has had noticed a rash on her chest, however, she, states, that that has been there for quite some time.  She does utilize lotion.  She, reports, a good appetite.  She denies any nausea, vomiting, diarrhea, constipation, chest pain, shortness breath, or cough.  Any fevers, chills, or night sweats.  She denies any mucositis.  She denies any lower leg swelling.  She denies any headaches or visual changes.  She denies any obvious, or abnormal bleeding.  We do like to keep her.  Iron deficient.  Her last iron panel back in March revealed an iron of 27, with 6% saturation.  Her ferritin was 22.  Review of Systems: Constitutional:Negative for malaise/fatigue, fever, chills, weight loss, diaphoresis, activity change, appetite change, and unexpected weight change.  HEENT: Negative for double vision, blurred vision, visual loss, ear pain, tinnitus, congestion, rhinorrhea, epistaxis sore throat or sinus disease, oral pain/lesion, tongue soreness Respiratory: Negative for cough, chest tightness, shortness of breath, wheezing and stridor.  Cardiovascular: Negative for chest pain, palpitations, leg swelling, orthopnea, PND, DOE or claudication Gastrointestinal: Negative for nausea, vomiting, abdominal pain, diarrhea, constipation, blood in stool,  melena, hematochezia, abdominal distention, anal bleeding, rectal pain, anorexia and hematemesis.  Genitourinary: Negative for dysuria, frequency, hematuria,  Musculoskeletal: Negative for myalgias, back pain, joint swelling, arthralgias and gait problem.  Skin: Negative for rash, color change, pallor and wound.  Neurological:. Negative for dizziness/light-headedness, tremors, seizures, syncope, facial asymmetry, speech difficulty, weakness, numbness, headaches and paresthesias.  Hematological: Negative for adenopathy. Does not bruise/bleed easily.  Psychiatric/Behavioral:  Negative for depression, no loss of interest in normal activity or change in sleep pattern.   Physical Exam: This is an elderly, 79 year old, well-developed, well-nourished, white female, in no obvious distress Vitals: Temperature 98.0 degrees, pulse 73, respirations 16, blood pressure 140/57, weight 182 pounds HEENT reveals a normocephalic, atraumatic skull, no scleral icterus, no oral lesions  Neck is supple without any cervical or supraclavicular adenopathy.  Lungs are clear to auscultation bilaterally. There are no wheezes, rales or rhonci Cardiac is regular rate and rhythm with a normal S1 and S2. There are no murmurs, rubs, or bruits.  Abdomen is soft with good bowel sounds, there is no palpable mass. There is no palpable hepatosplenomegaly. There is no palpable fluid wave.  Musculoskeletal no tenderness of the spine, ribs, or hips.  Extremities there are no clubbing, cyanosis, or edema.  Skin no petechia, purpura or ecchymosis Neurologic is nonfocal.  Laboratory Data: White count 6.8, hemoglobin 13.1, hematocrit 42.4, platelets 435,000  Current Outpatient Prescriptions on File Prior to Visit  Medication Sig Dispense Refill  . acetaminophen-codeine (TYLENOL #3) 300-30 MG per tablet Take 1 tablet by mouth every 8 (eight) hours as needed. For migraine      . aspirin 81 MG tablet Take  162 mg by mouth daily.      .  hydrochlorothiazide (HYDRODIURIL) 25 MG tablet Take 1 tablet (25 mg total) by mouth daily as needed (swelling in the legs ).  30 tablet  1  . hydroxyurea (HYDREA) 500 MG capsule Take 500 mg by mouth daily. May take with food to minimize GI side effects.      Marland Kitchen letrozole (FEMARA) 2.5 MG tablet Take 2.5 mg by mouth daily.      Marland Kitchen LORazepam (ATIVAN) 0.5 MG tablet Take 1 tablet (0.5 mg total) by mouth 2 (two) times daily as needed for anxiety.  30 tablet  5  . metoprolol (LOPRESSOR) 50 MG tablet Take 0.5 tablets (25 mg total) by mouth 2 (two) times daily.  90 tablet  1  . omeprazole (PRILOSEC) 40 MG capsule Take 20 mg by mouth daily.       . rosuvastatin (CRESTOR) 5 MG tablet Take 2.5 mg by mouth daily. Take one every other day      . Simethicone (GAS-X PO) Take 1 tablet by mouth daily as needed. For stomach pain.      . vitamin E 400 UNIT capsule Take 400 Units by mouth daily.        No current facility-administered medications on file prior to visit.   Assessment/Plan: This is a pleasant, 79 year old, white female, with the following issues:  #1.  Polycythemia vera.  Again, she is JAK2 positive.  She will remain on Hydrea every other day.  She will not need a phlebotomy today.  We do like to keep her hematocrit below 45%.  #2.  Stage I ductal carcinoma of the left breast.  She is ER positive.  She remains on Femara 2.5 mg by mouth daily without any problems.  #3.  Followup.  We will follow back up with [name] in 2 months, but before then should there be questions or concerns.

## 2012-09-24 NOTE — Progress Notes (Signed)
No Phlebotomy needed today due to lab being within normal limit.

## 2012-09-27 LAB — STOOL CULTURE

## 2012-10-15 ENCOUNTER — Other Ambulatory Visit: Payer: Self-pay | Admitting: Dermatology

## 2012-10-15 DIAGNOSIS — L820 Inflamed seborrheic keratosis: Secondary | ICD-10-CM | POA: Diagnosis not present

## 2012-10-15 DIAGNOSIS — L259 Unspecified contact dermatitis, unspecified cause: Secondary | ICD-10-CM | POA: Diagnosis not present

## 2012-10-15 DIAGNOSIS — Z85828 Personal history of other malignant neoplasm of skin: Secondary | ICD-10-CM | POA: Diagnosis not present

## 2012-10-15 DIAGNOSIS — D485 Neoplasm of uncertain behavior of skin: Secondary | ICD-10-CM | POA: Diagnosis not present

## 2012-10-15 DIAGNOSIS — B009 Herpesviral infection, unspecified: Secondary | ICD-10-CM | POA: Diagnosis not present

## 2012-10-21 ENCOUNTER — Ambulatory Visit
Admission: RE | Admit: 2012-10-21 | Discharge: 2012-10-21 | Disposition: A | Discharge status: Home / Self Care | Payer: Medicare Other | Source: Ambulatory Visit | Attending: General Surgery | Admitting: General Surgery

## 2012-10-21 DIAGNOSIS — Z853 Personal history of malignant neoplasm of breast: Secondary | ICD-10-CM | POA: Diagnosis not present

## 2012-10-25 ENCOUNTER — Other Ambulatory Visit: Payer: Self-pay | Admitting: Internal Medicine

## 2012-10-27 NOTE — Telephone Encounter (Signed)
Refill done.  

## 2012-11-16 IMAGING — MR MR HEAD W/O CM
9 series · 32 of 48 positions shown · non-contrast
Comparison: none

[Series 2: t1_se_sag · sagittal · 5.0mm · 0.45mm/px · 2 of 19 slices shown]
[im 1/19]
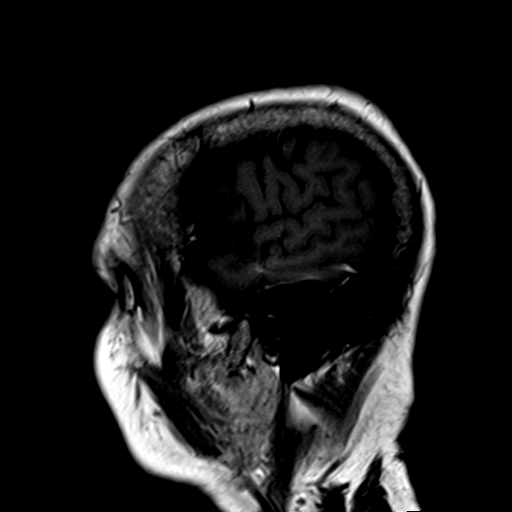
[im 19/19]
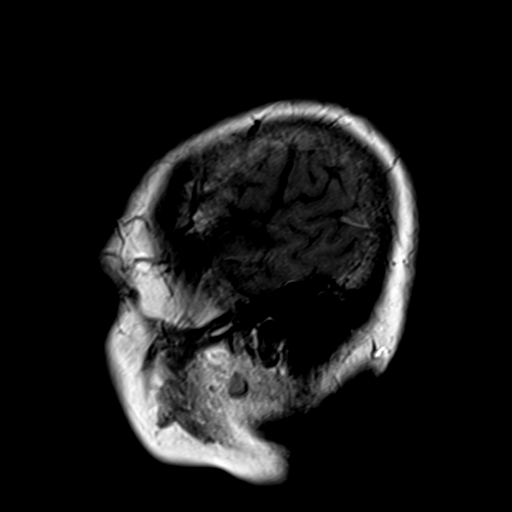

[Series 3: ep2d_diff_(id)_trace · axial · 5.0mm · 1.80mm/px · z∈[-42,+87]mm · 5 of 42 slices shown]
[im 1/42]
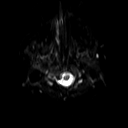
[im 11/42]
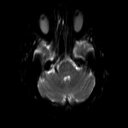
[im 21/42]
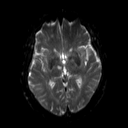
[im 31/42]
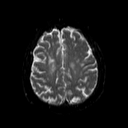
[im 42/42]
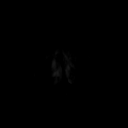

[Series 4: ep2d_diff_(id)_trace_adc · axial · 5.0mm · 1.80mm/px · z∈[-42,+87]mm · 3 of 21 slices shown]
[im 1/21]
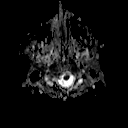
[im 11/21]
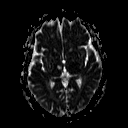
[im 21/21]
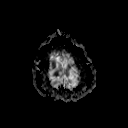

[Series 11: FLAIR · axial · 5.0mm · 0.45mm/px · z∈[-42,+87]mm · 3 of 21 slices shown]
[im 1/21]
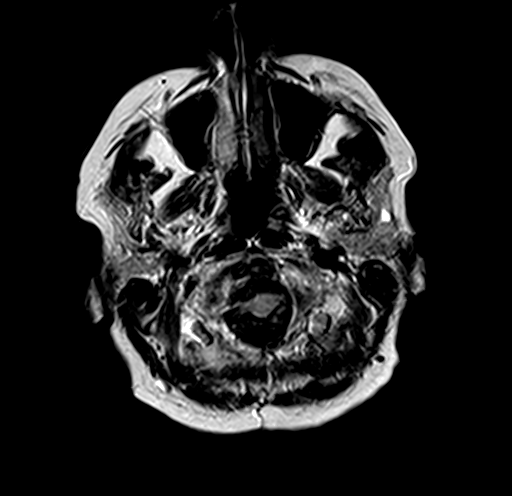
[im 11/21]
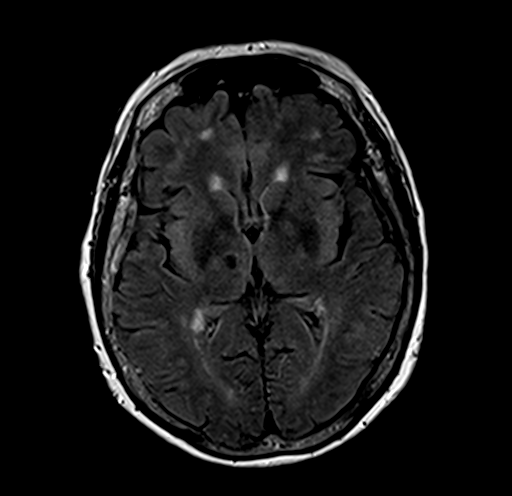
[im 21/21]
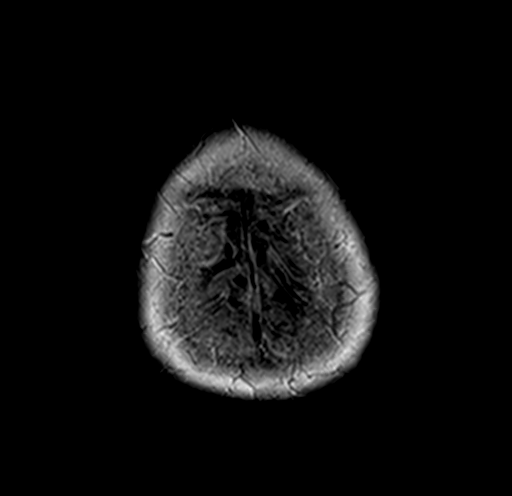

[Series 12: tof_3d_multi-slab new · axial · 0.7mm · 0.35mm/px · z∈[-12,+66]mm · 8 of 130 slices shown]
[im 9/130]
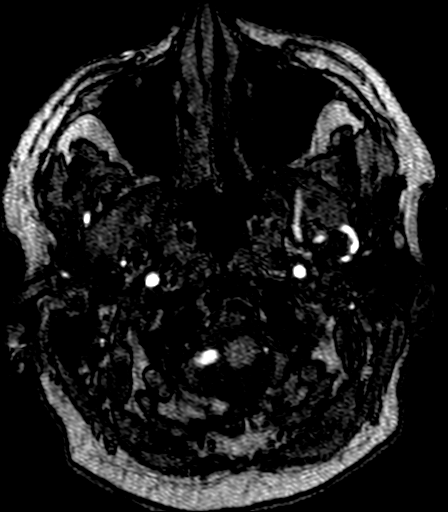
[im 25/130]
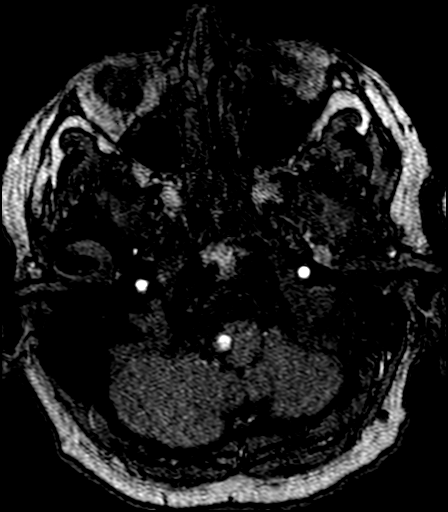
[im 41/130]
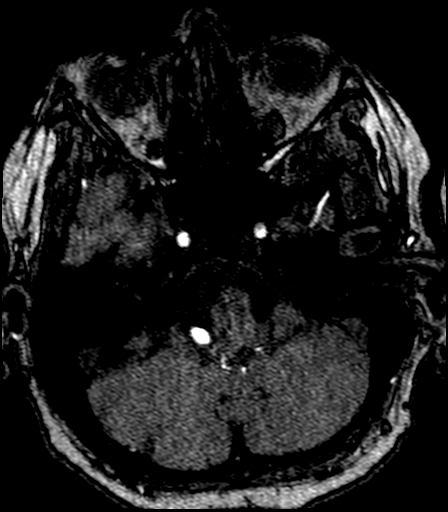
[im 57/130]
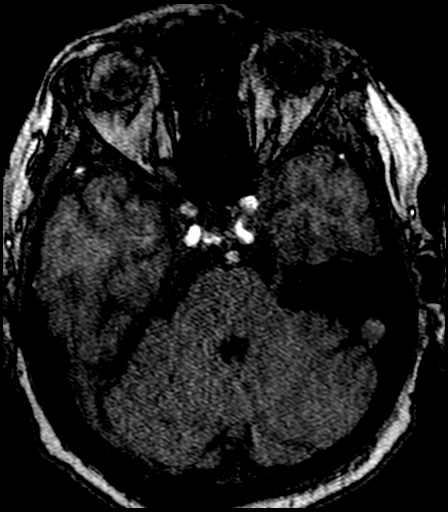
[im 73/130]
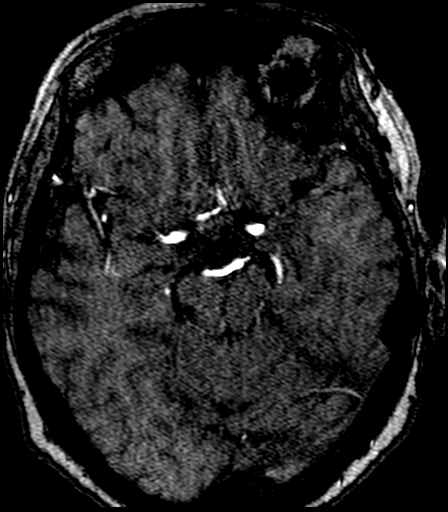
[im 89/130]
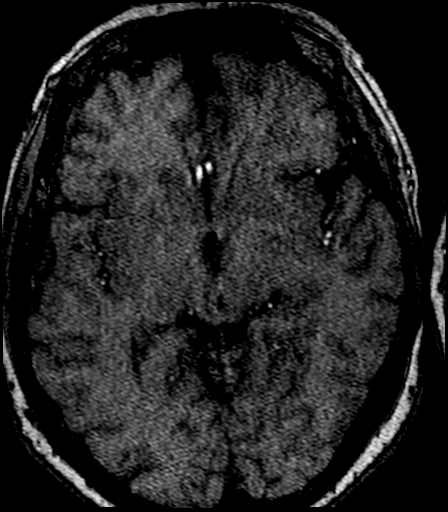
[im 105/130]
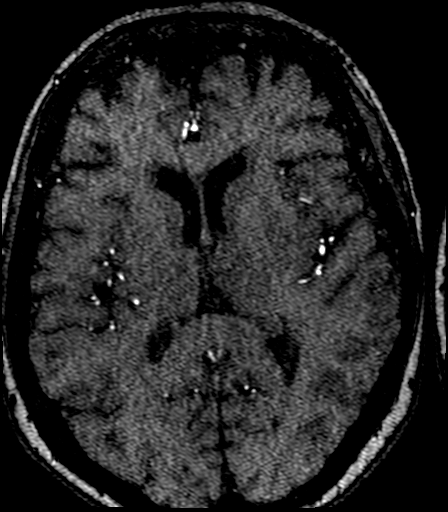
[im 121/130]
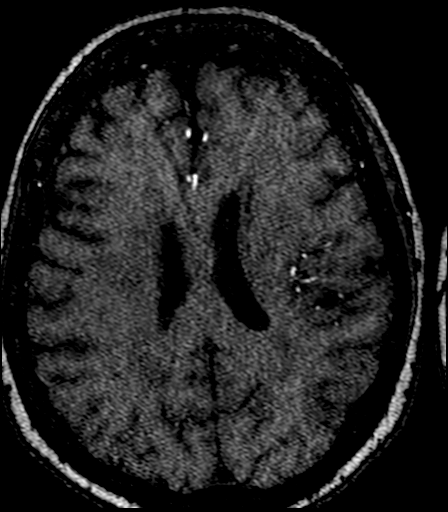

[Series 16: t2_tse_tra_512 · axial · 5.0mm · 0.60mm/px · z∈[-39,+90]mm · 3 of 21 slices shown]
[im 1/21]
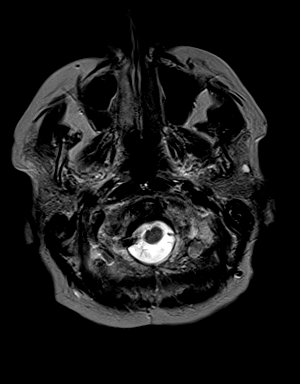
[im 11/21]
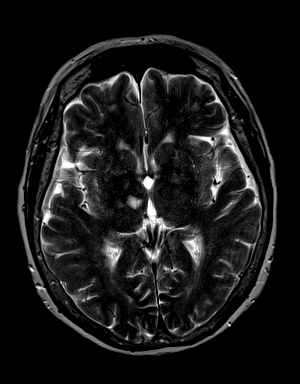
[im 21/21]
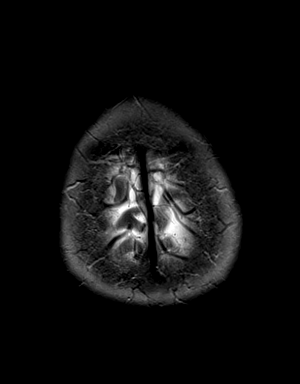

[Series 19: t1_mpr_tra · axial · 2.0mm · 0.45mm/px · z∈[-45,-29]mm · 2 of 72 slices shown]
[im 1/72]
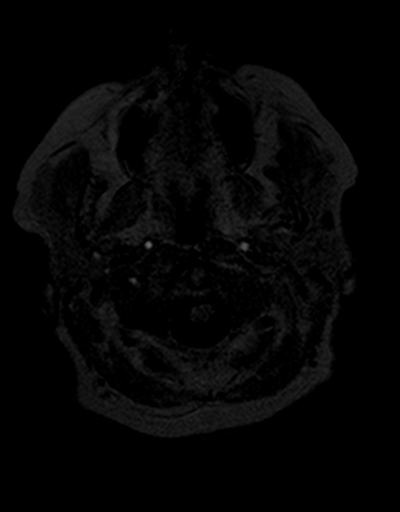
[im 9/72]
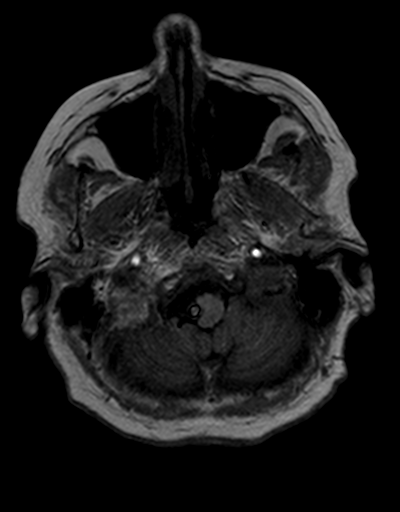

[Series 21: GRE · axial · 5.0mm · 0.45mm/px · z∈[-38,+91]mm · 3 of 21 slices shown]
[im 1/21]
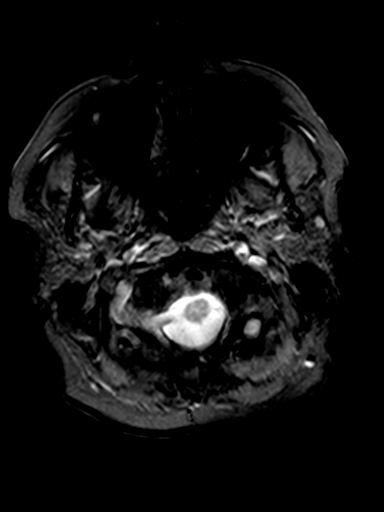
[im 11/21]
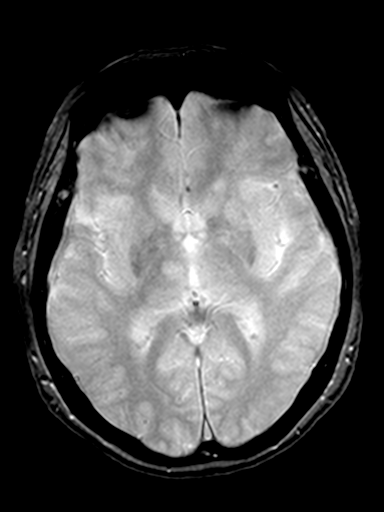
[im 21/21]
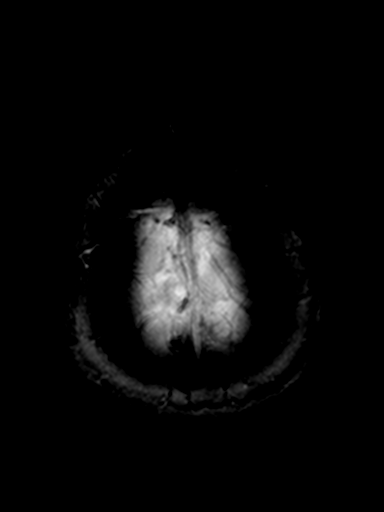

[Series 23: T2 · coronal · 5.0mm · 0.45mm/px · 3 of 22 slices shown]
[im 1/22]
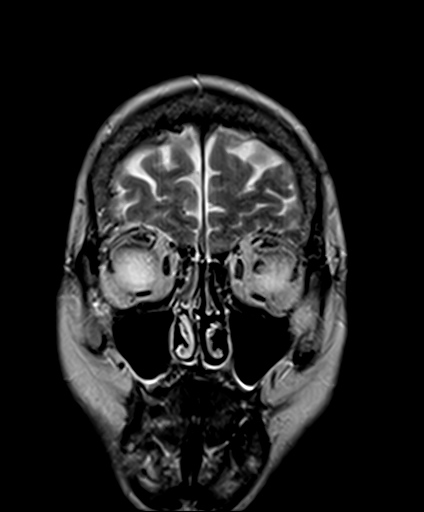
[im 11/22]
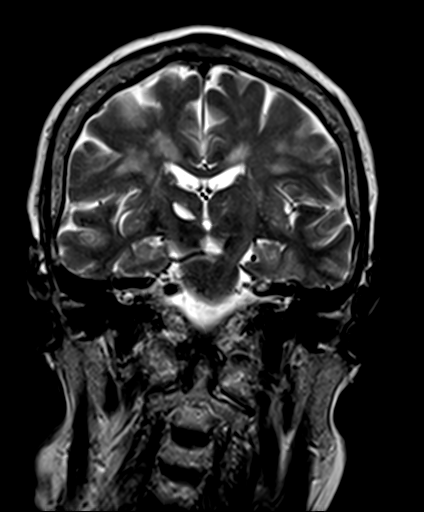
[im 22/22]
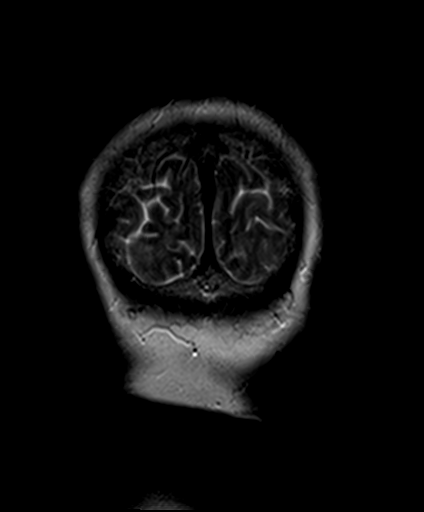

[32 of 48 positions shown; findings below may reference images not displayed]

This examination was performed at [HOSPITAL] at [HOSPITAL]. The interpretation will be provided by [REDACTED].

## 2012-11-24 ENCOUNTER — Telehealth: Payer: Self-pay | Admitting: Internal Medicine

## 2012-11-24 NOTE — Telephone Encounter (Signed)
done

## 2012-11-24 NOTE — Telephone Encounter (Signed)
Ok to refill? Last OV 4.10.14 Last filled 3.28.14 #60 with 2 refills.

## 2012-11-24 NOTE — Telephone Encounter (Signed)
Refill faxed to pharmacy.

## 2012-11-28 ENCOUNTER — Other Ambulatory Visit (HOSPITAL_BASED_OUTPATIENT_CLINIC_OR_DEPARTMENT_OTHER): Payer: Medicare Other | Admitting: Lab

## 2012-11-28 ENCOUNTER — Ambulatory Visit (HOSPITAL_BASED_OUTPATIENT_CLINIC_OR_DEPARTMENT_OTHER): Payer: Medicare Other | Admitting: Hematology & Oncology

## 2012-11-28 VITALS — BP 130/75 | HR 73 | Temp 98.3°F | Resp 16 | Ht 66.0 in | Wt 179.0 lb

## 2012-11-28 DIAGNOSIS — M810 Age-related osteoporosis without current pathological fracture: Secondary | ICD-10-CM

## 2012-11-28 DIAGNOSIS — D45 Polycythemia vera: Secondary | ICD-10-CM

## 2012-11-28 DIAGNOSIS — C50911 Malignant neoplasm of unspecified site of right female breast: Secondary | ICD-10-CM

## 2012-11-28 DIAGNOSIS — D751 Secondary polycythemia: Secondary | ICD-10-CM

## 2012-11-28 DIAGNOSIS — C50919 Malignant neoplasm of unspecified site of unspecified female breast: Secondary | ICD-10-CM | POA: Diagnosis not present

## 2012-11-28 LAB — COMPREHENSIVE METABOLIC PANEL
ALT: 10 U/L (ref 0–35)
AST: 15 U/L (ref 0–37)
Albumin: 4 g/dL (ref 3.5–5.2)
Alkaline Phosphatase: 90 U/L (ref 39–117)
BUN: 11 mg/dL (ref 6–23)
CO2: 27 mEq/L (ref 19–32)
Calcium: 9.4 mg/dL (ref 8.4–10.5)
Chloride: 102 mEq/L (ref 96–112)
Creatinine, Ser: 0.82 mg/dL (ref 0.50–1.10)
Glucose, Bld: 105 mg/dL — ABNORMAL HIGH (ref 70–99)
Potassium: 4 mEq/L (ref 3.5–5.3)
Sodium: 137 mEq/L (ref 135–145)
Total Bilirubin: 1.5 mg/dL — ABNORMAL HIGH (ref 0.3–1.2)
Total Protein: 6.8 g/dL (ref 6.0–8.3)

## 2012-11-28 LAB — CBC WITH DIFFERENTIAL (CANCER CENTER ONLY)
BASO#: 0.1 10*3/uL (ref 0.0–0.2)
BASO%: 0.7 % (ref 0.0–2.0)
EOS%: 2.7 % (ref 0.0–7.0)
Eosinophils Absolute: 0.2 10*3/uL (ref 0.0–0.5)
HCT: 41.7 % (ref 34.8–46.6)
HGB: 12.8 g/dL (ref 11.6–15.9)
LYMPH#: 1.2 10*3/uL (ref 0.9–3.3)
LYMPH%: 17.4 % (ref 14.0–48.0)
MCH: 24.8 pg — ABNORMAL LOW (ref 26.0–34.0)
MCHC: 30.7 g/dL — ABNORMAL LOW (ref 32.0–36.0)
MCV: 81 fL (ref 81–101)
MONO#: 0.7 10*3/uL (ref 0.1–0.9)
MONO%: 10.9 % (ref 0.0–13.0)
NEUT#: 4.6 10*3/uL (ref 1.5–6.5)
NEUT%: 68.3 % (ref 39.6–80.0)
Platelets: 416 10*3/uL — ABNORMAL HIGH (ref 145–400)
RBC: 5.17 10*6/uL (ref 3.70–5.32)
RDW: 15.9 % — ABNORMAL HIGH (ref 11.1–15.7)
WBC: 6.7 10*3/uL (ref 3.9–10.0)

## 2012-11-28 NOTE — Progress Notes (Signed)
This office note has been dictated.

## 2012-11-29 NOTE — Progress Notes (Signed)
CC:   Dawn Ora, MD  DIAGNOSIS: 1. Polycythemia vera - JAK2 positive. 2. Stage I (T1b N0 M0) ductal carcinoma of the left breast.  CURRENT THERAPY: 1. Hydrea 500 mg p.o. every 3rd day. 2. Femara 2.5 mg p.o. daily. 3. Phlebotomy to maintain hematocrit less than 45%.  INTERIM HISTORY:  [name] comes in for followup.  Her main problem is just being tired.  I have to suspect that a lot of this is going to be from iron deficiency.  Her ferritin is down to 17.  Her iron saturation is 7%.  Her MCV is going down also.  Unfortunately, I told her that she cannot take oral iron.  I told her the one thing that she could try is over-the-counter vitamin B12.  I told her that she would not qualify for shots of the B12 because she is not clinically B12 deficient.  She is complaining of some joint aches and pains.  She is not on vitamin D.  I told to take vitamin D 5000 units a day.  I told that she could eat red meat and dark-green vegetables which do have some iron which would be helpful for her.  She has had no nausea or vomiting.  There has been no change in bowel or bladder habits.  There have been no rashes.  PHYSICAL EXAM:  General:  This is an elderly appearing white female in no obvious distress.  Vital Signs:  Show a temperature of 98.3, pulse 73.  Respiratory rate is 16.  Blood pressure 130/75.  Weight is 179. Head and Neck:  Show a normocephalic, atraumatic skull.  There are no ocular or oral lesions.  There are no palpable cervical or supraclavicular lymph nodes.  Lungs:  Clear bilaterally.  Cardiac: Regular rate and rhythm with a normal S1 and S2.  There are no murmurs, rubs, or bruits.  Abdomen:  Soft with good bowel sounds.  There is no palpable abdominal mass.  There is no palpable hepatosplenomegaly. Extremities:  Show no clubbing, cyanosis, or edema.  Neurological: Shows no focal neurological deficits.  Skin:  No rashes, ecchymosis, or petechia.  LABORATORY STUDIES:   White cell count is 6.7.  Hemoglobin 12.8, hematocrit 41.7, platelet count 416.  MCV is 81.  IMPRESSION:  [name] is a very nice 79 year old white female with 2 separate issues.  She has polycythemia vera.  She has stage I breast cancer.  Again, her quality of life is suffering a little bit because of the fatigue.  Again, she is iron deficient.  Hopefully, she can liberalize her diet a little bit.  I am cutting her Hydrea dose down to every third day now.  She will be taking vitamin D 5000 units a day.  Hopefully, this will help with her joints.  She does not need to be phlebotomized.  I will go ahead and plan to get her back in about a month or so, and hopefully she will be feeling a little better.    ______________________________ Josph Macho, M.D. PRE/MEDQ  D:  11/28/2012  T:  11/29/2012  Job:  [id number]

## 2012-12-01 LAB — IRON AND TIBC CHCC
%SAT: 3 % — ABNORMAL LOW (ref 21–57)
Iron: 13 ug/dL — ABNORMAL LOW (ref 41–142)
TIBC: 426 ug/dL (ref 236–444)
UIBC: 413 ug/dL — ABNORMAL HIGH (ref 120–384)

## 2012-12-01 LAB — FERRITIN CHCC: Ferritin: 26 ng/ml (ref 9–269)

## 2012-12-15 ENCOUNTER — Other Ambulatory Visit: Payer: Self-pay | Admitting: Nurse Practitioner

## 2012-12-15 MED ORDER — LETROZOLE 2.5 MG PO TABS: 2.5000 mg | ORAL_TABLET | Freq: Every day | ORAL | Status: DC

## 2012-12-26 ENCOUNTER — Ambulatory Visit (HOSPITAL_BASED_OUTPATIENT_CLINIC_OR_DEPARTMENT_OTHER): Payer: Medicare Other | Admitting: Hematology & Oncology

## 2012-12-26 ENCOUNTER — Other Ambulatory Visit (HOSPITAL_BASED_OUTPATIENT_CLINIC_OR_DEPARTMENT_OTHER): Payer: Medicare Other | Admitting: Lab

## 2012-12-26 VITALS — BP 129/83 | HR 71 | Temp 98.0°F | Resp 16 | Ht 67.0 in | Wt 180.0 lb

## 2012-12-26 DIAGNOSIS — C50919 Malignant neoplasm of unspecified site of unspecified female breast: Secondary | ICD-10-CM

## 2012-12-26 DIAGNOSIS — D45 Polycythemia vera: Secondary | ICD-10-CM

## 2012-12-26 DIAGNOSIS — D751 Secondary polycythemia: Secondary | ICD-10-CM

## 2012-12-26 DIAGNOSIS — M810 Age-related osteoporosis without current pathological fracture: Secondary | ICD-10-CM

## 2012-12-26 DIAGNOSIS — E568 Deficiency of other vitamins: Secondary | ICD-10-CM | POA: Diagnosis not present

## 2012-12-26 DIAGNOSIS — C50911 Malignant neoplasm of unspecified site of right female breast: Secondary | ICD-10-CM

## 2012-12-26 LAB — CBC WITH DIFFERENTIAL (CANCER CENTER ONLY)
BASO#: 0 10*3/uL (ref 0.0–0.2)
BASO%: 0.6 % (ref 0.0–2.0)
EOS%: 2.8 % (ref 0.0–7.0)
Eosinophils Absolute: 0.2 10*3/uL (ref 0.0–0.5)
HCT: 41.8 % (ref 34.8–46.6)
HGB: 12.8 g/dL (ref 11.6–15.9)
LYMPH#: 1.4 10*3/uL (ref 0.9–3.3)
LYMPH%: 21 % (ref 14.0–48.0)
MCH: 24.3 pg — ABNORMAL LOW (ref 26.0–34.0)
MCHC: 30.6 g/dL — ABNORMAL LOW (ref 32.0–36.0)
MCV: 80 fL — ABNORMAL LOW (ref 81–101)
MONO#: 0.8 10*3/uL (ref 0.1–0.9)
MONO%: 11 % (ref 0.0–13.0)
NEUT#: 4.4 10*3/uL (ref 1.5–6.5)
NEUT%: 64.6 % (ref 39.6–80.0)
Platelets: 410 10*3/uL — ABNORMAL HIGH (ref 145–400)
RBC: 5.26 10*6/uL (ref 3.70–5.32)
RDW: 16 % — ABNORMAL HIGH (ref 11.1–15.7)
WBC: 6.8 10*3/uL (ref 3.9–10.0)

## 2012-12-26 LAB — IRON AND TIBC CHCC
%SAT: 6 % — ABNORMAL LOW (ref 21–57)
Iron: 25 ug/dL — ABNORMAL LOW (ref 41–142)
TIBC: 429 ug/dL (ref 236–444)
UIBC: 404 ug/dL — ABNORMAL HIGH (ref 120–384)

## 2012-12-26 LAB — FERRITIN CHCC: Ferritin: 22 ng/ml (ref 9–269)

## 2012-12-26 NOTE — Progress Notes (Signed)
This office note has been dictated.

## 2012-12-27 NOTE — Progress Notes (Signed)
CC:   Dawn Ora, MD  DIAGNOSES: 1. Polycythemia vera, JAK2 positive. 2. Stage I (T1b N0 M0) ductal carcinoma of the left breast.  CURRENT THERAPY: 1. Hydrea 500 mg p.o. every 3rd day. 2. Femara 2.5 mg p.o. q. day. 3. Phlebotomy to maintain hematocrit below 45%.  INTERIM HISTORY:  [name] comes in for followup.  She is doing quite well.  She is having some issues with a finger on her left hand. This is the middle finger.  She says that it "pops."  She also is having some issues with vitamin D.  She says that she cannot take vitamin D because of some ingredient in there that causes her to have migraines.  As such, she is not taking any vitamin D right now.  She is doing well with the Hydrea.  She takes it once every 3 days.  She also takes 2 baby aspirin with this.  She has had no problems with bleeding or bruising.  There has been no fever.  There has been no cough.  PHYSICAL EXAM:  General:  This is an elderly white female in no obvious distress.  Vital Signs:  Show a temperature of 98, pulse 71, respiratory rate 16, blood pressure 129/83.  Weight is 180 pounds.  Head and Neck: Show a normocephalic, atraumatic skull.  There are no ocular or oral lesions.  There are no palpable cervical or supraclavicular lymph nodes. Lungs:  Clear bilaterally.  Cardiac:  Regular rate and rhythm with a normal S1 AND S2.  There are no murmurs, rubs, or bruits.  Abdomen: Soft.  She has good bowel sounds.  There is no fluid wave.  There is no palpable hepatosplenomegaly.  Extremities:  Show no clubbing, cyanosis, or edema.  She has a bandage wrapped around the 3rd digit on her left hand.  Skin:  Shows no rashes, ecchymosis, or petechia.  LABORATORY STUDIES:  White cell count is 6.8.  Hemoglobin 12.8; hematocrit 41.8; platelet count 410,000.  MCV is 80.  IMPRESSION:  [name] is a nice 79 year old white female with 2 separate issues.  She has polycythemia vera.  She is JAK2 positive.   She is on aspirin for this.  She also gets phlebotomized.  She does not need to be phlebotomized today.  She is iron deficient which certainly has helped.  I do not see any issues with respect to breast cancer with her.  She is on Femara for her breast cancer.  We will go ahead and plan to get her back now in 3 months.  I think we can probably hold off for 3 months before we have to see her again.    ______________________________ Josph Macho, M.D. PRE/MEDQ  D:  12/26/2012  T:  12/27/2012  Job:  [id number]

## 2012-12-30 IMAGING — CR DG CHEST 2V
2 series · 2 of 2 positions shown · non-contrast
Comparison: 12/31/2008

CLINICAL DATA: Preop for 06/30/2010.  Scheduled for left total knee
arthroplasty.  Hypertension.  Ex-smoker.  Short of breath with
exertion.  Remote history of cardiac catheterization x2.

CHEST - 2 VIEW

[view not recorded (1 of 2)]
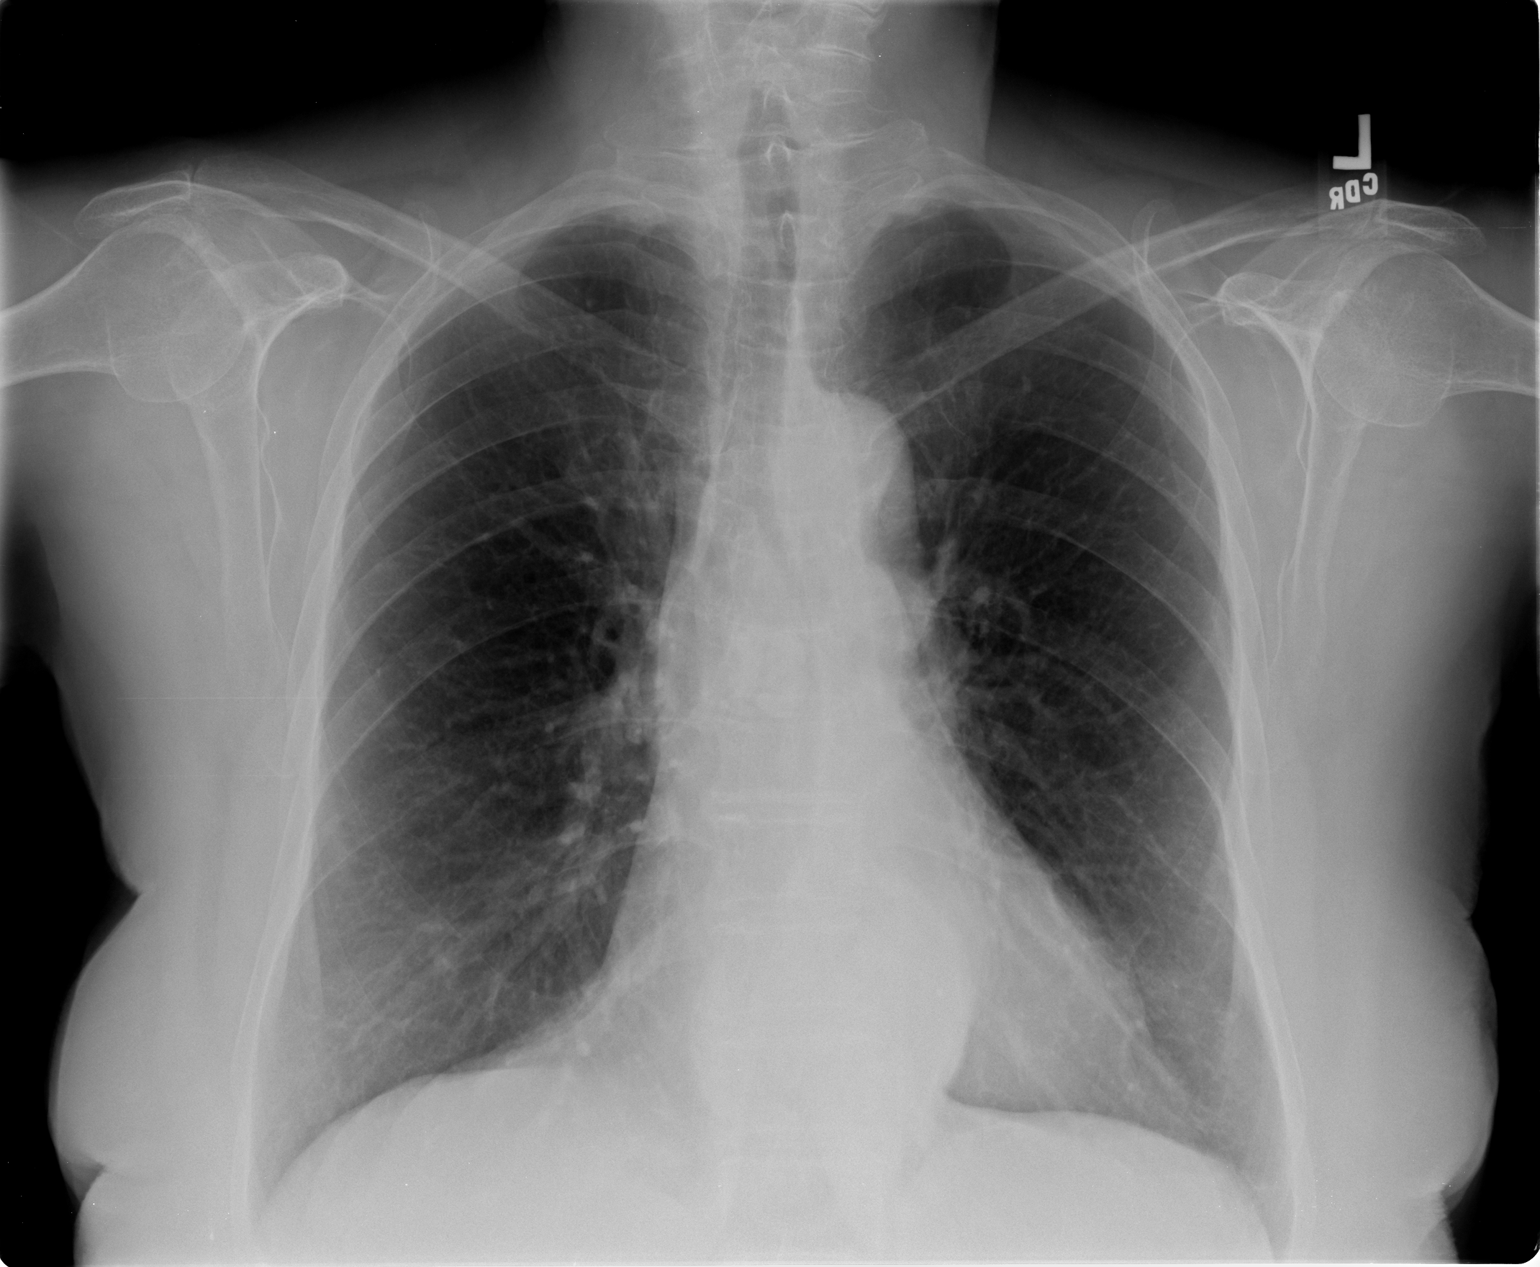

[view not recorded (2 of 2)]
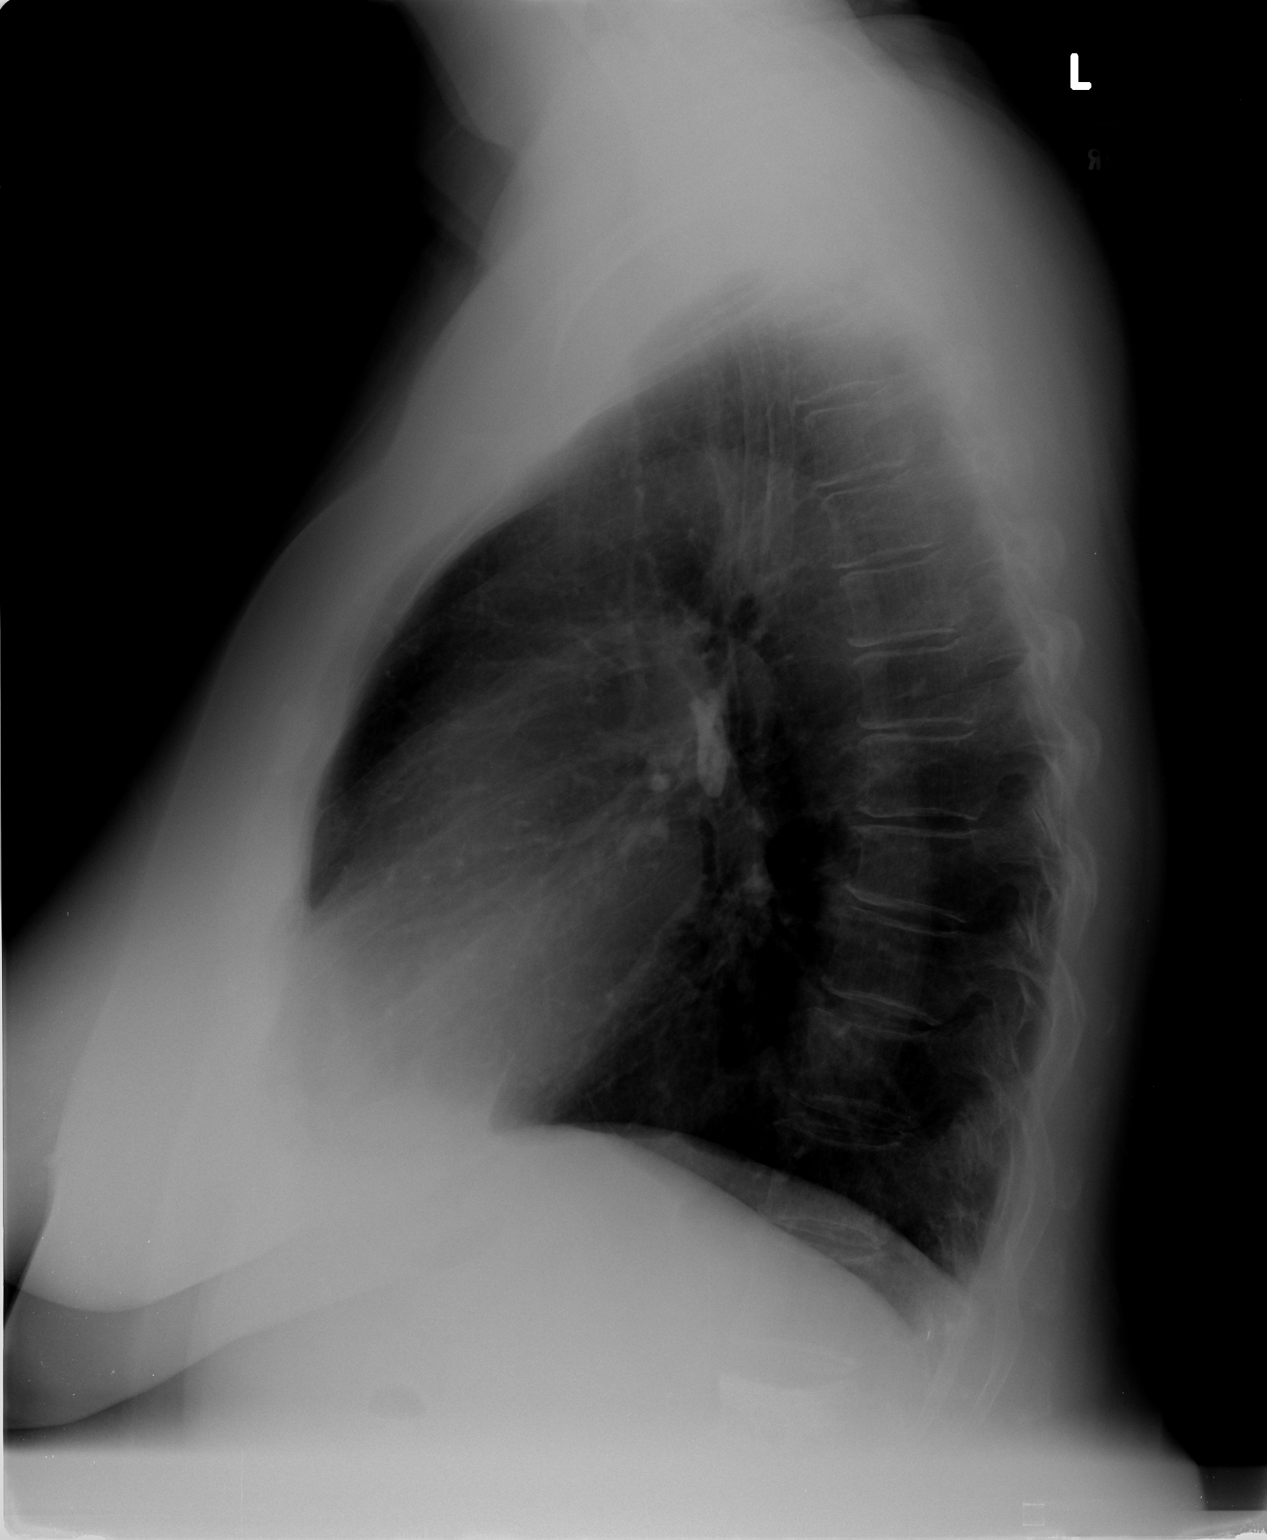

[2 of 2 positions shown; findings below may reference images not displayed]

FINDINGS: Calcified middle mediastinal lymph nodes, most apparent
on the lateral view, consistent with old granulomatous disease.
Midline trachea.  Normal heart size.  Moderate hiatal hernia. No
pleural effusion or pneumothorax.  Mild biapical pleural
thickening. Clear lungs.
IMPRESSION: Moderate hiatal hernia without acute finding.

## 2013-01-05 DIAGNOSIS — M6530 Trigger finger, unspecified finger: Secondary | ICD-10-CM | POA: Diagnosis not present

## 2013-01-15 IMAGING — CR DG KNEE COMPLETE 4+V*L*
4 series · 4 of 4 positions shown · non-contrast
Comparison: None.

CLINICAL DATA: Pain

LEFT KNEE - COMPLETE 4+ VIEW

[view not recorded (1 of 4)]
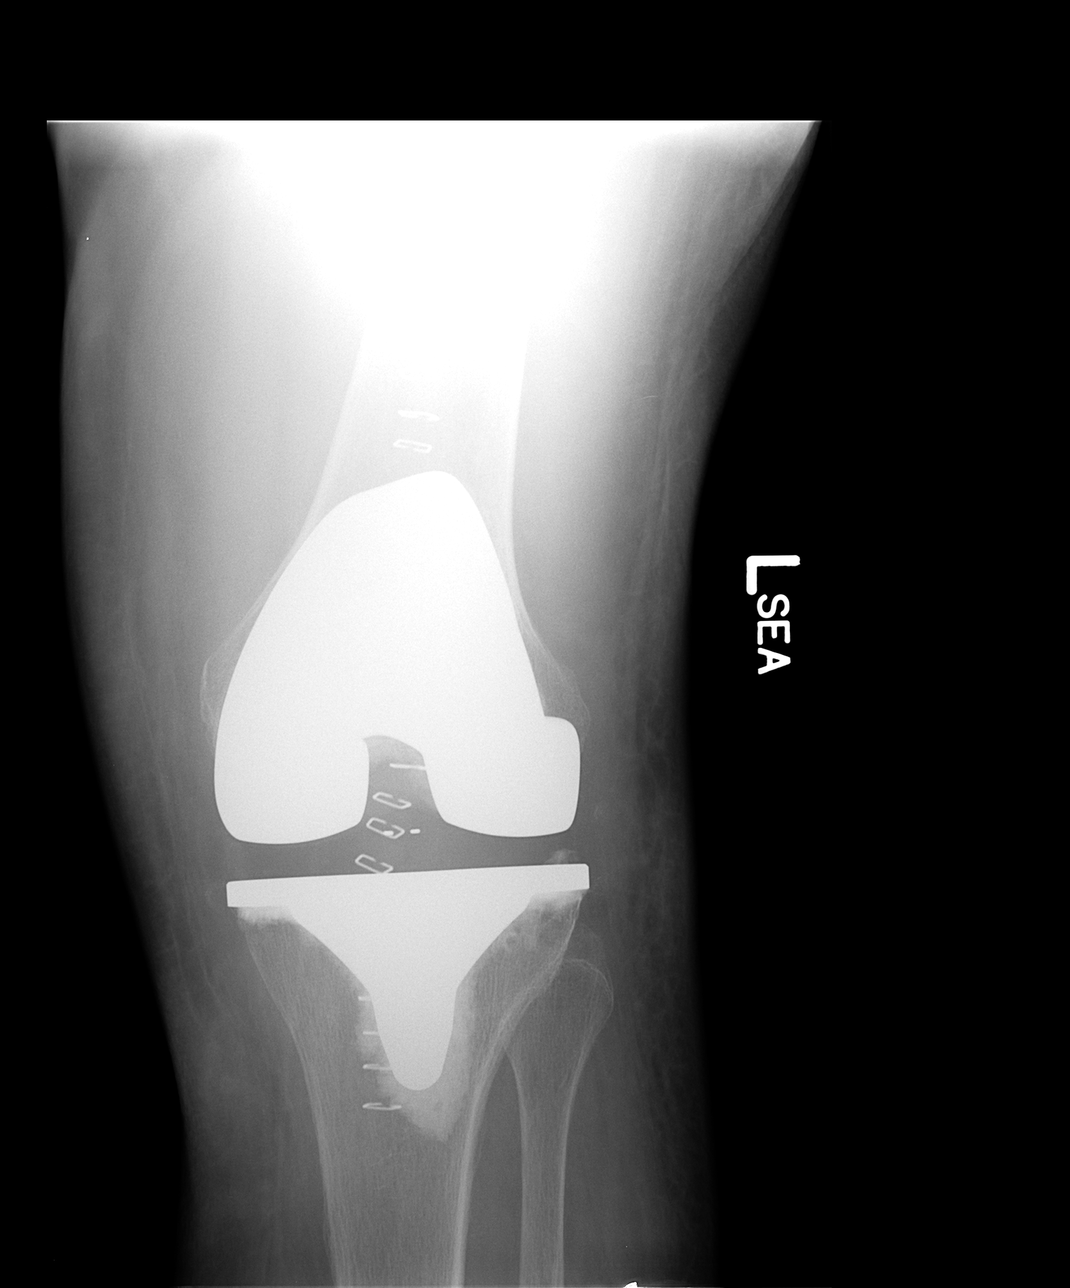

[view not recorded (2 of 4)]
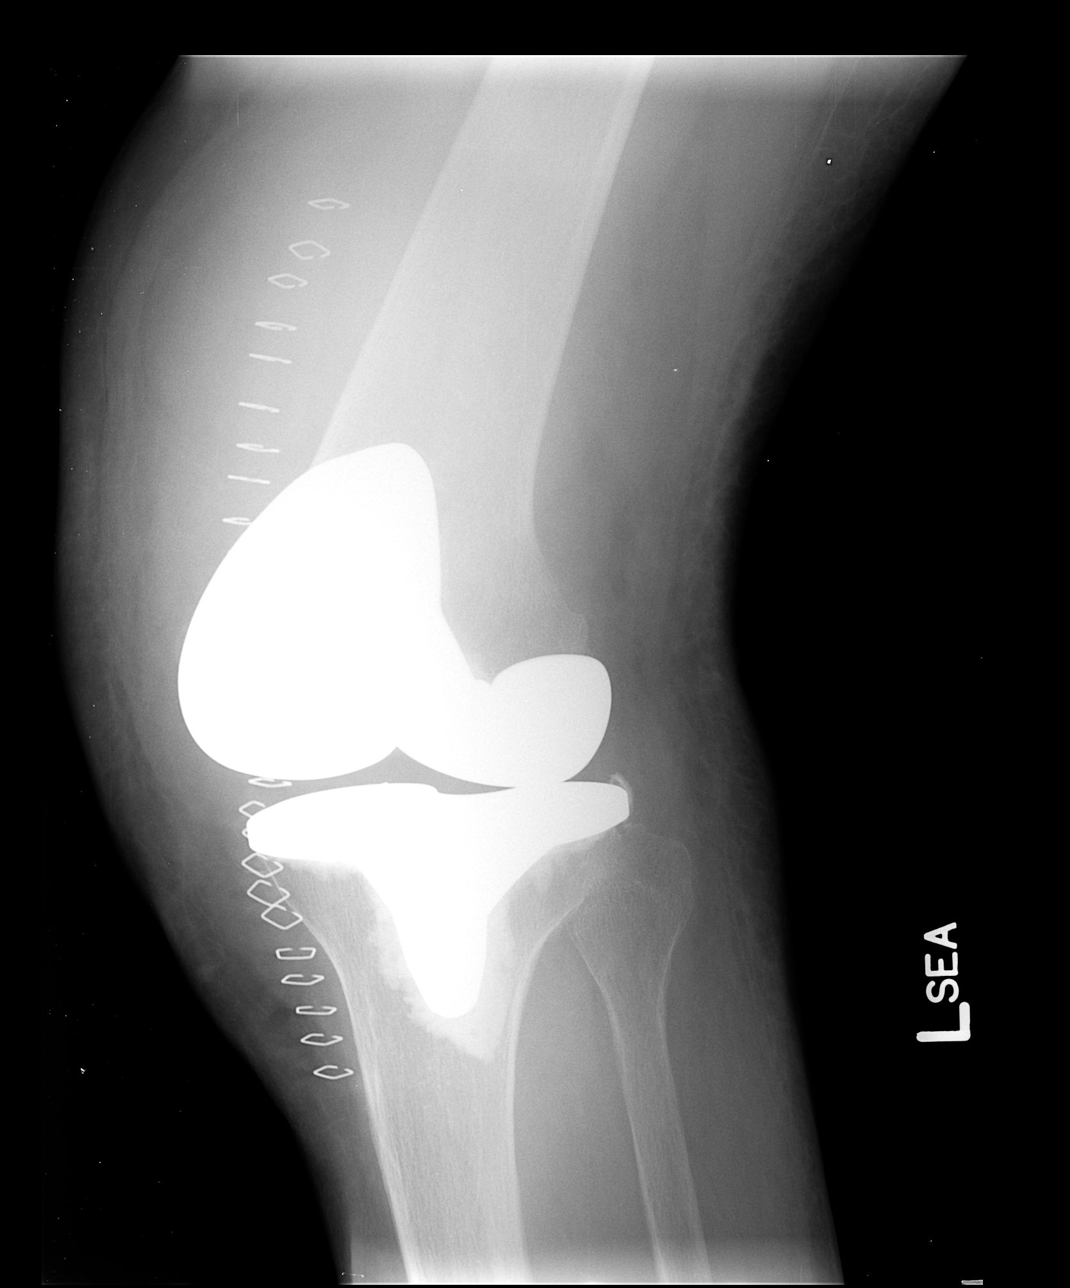

[view not recorded (3 of 4)]
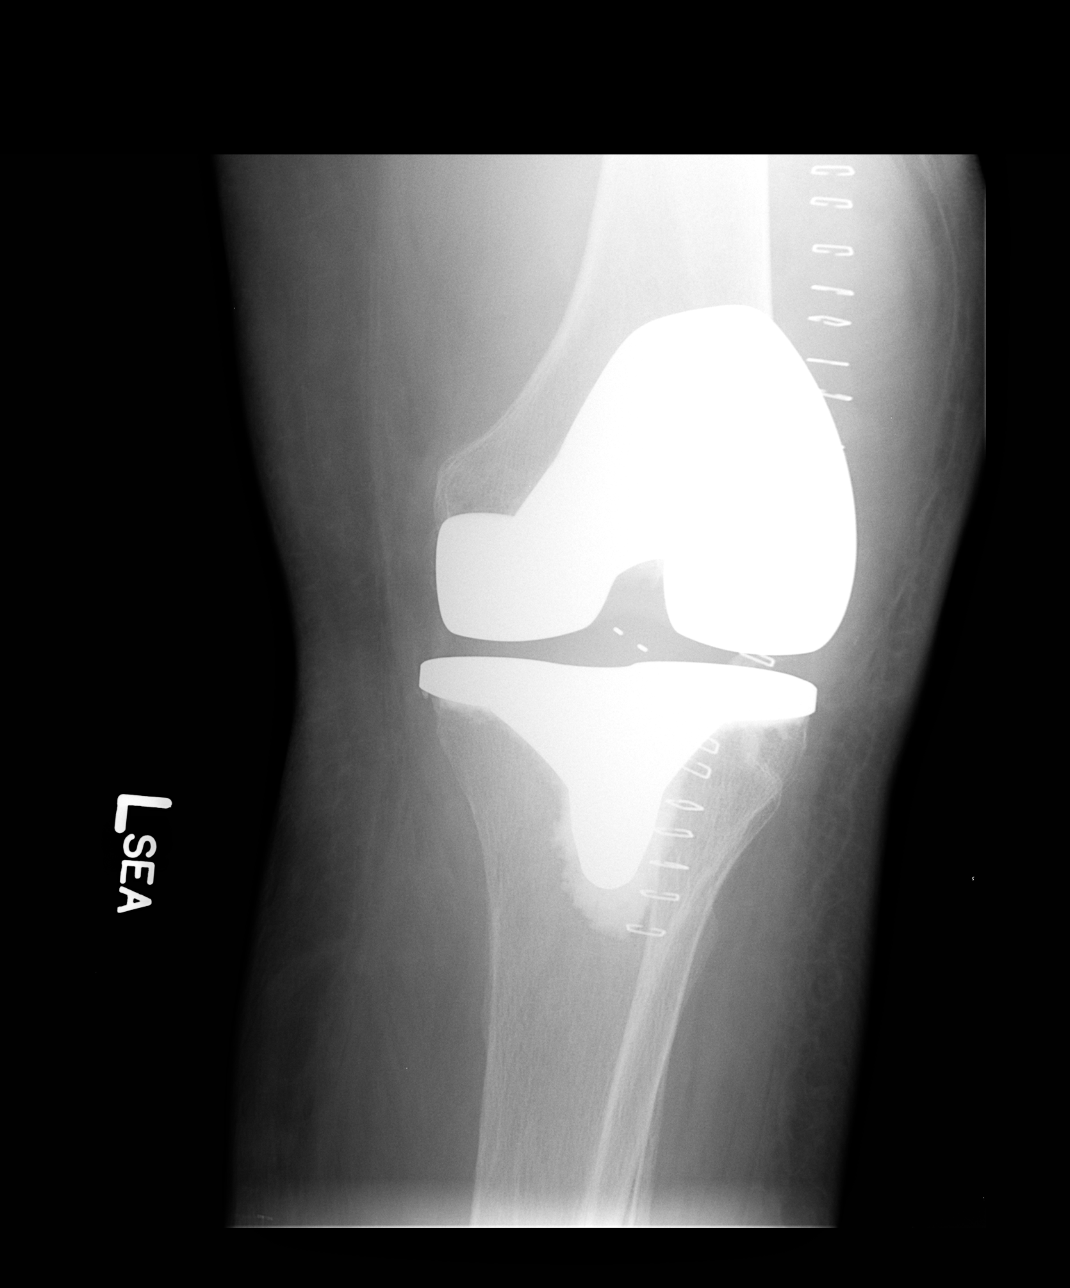

[view not recorded (4 of 4)]
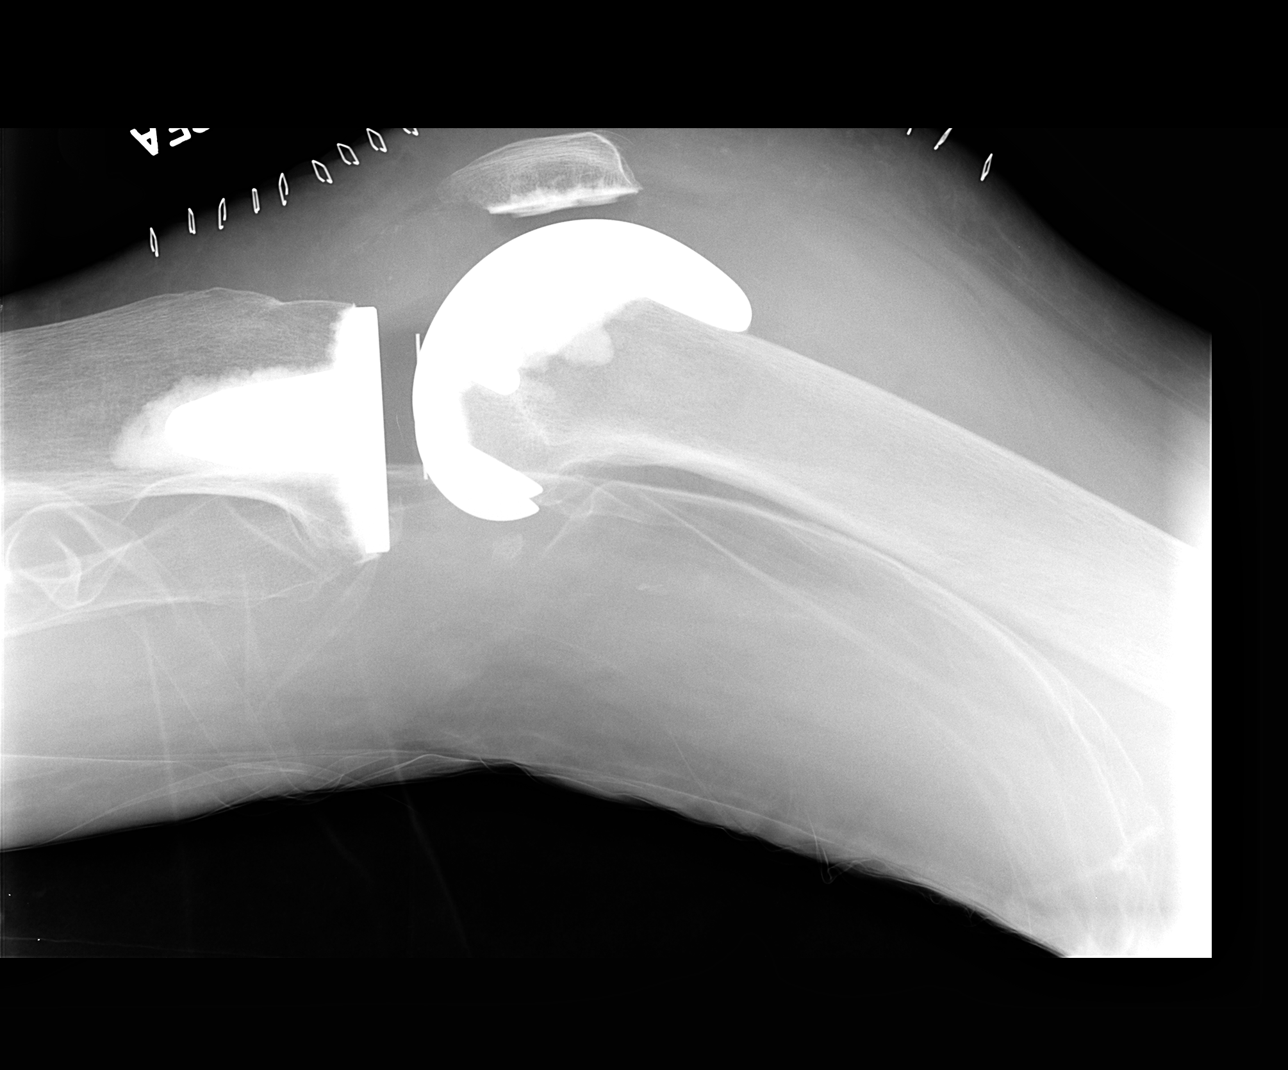

[4 of 4 positions shown; findings below may reference images not displayed]

FINDINGS: Cemented components of the left knee arthroplasty project
in expected location.  Moderate suprapatellar effusion.  Anterior
midline skin staples.  Negative for fracture, dislocation, or other
acute abnormality.
IMPRESSION: 1.  Changes of left knee arthroplasty with moderate effusion.

## 2013-01-26 ENCOUNTER — Ambulatory Visit (INDEPENDENT_AMBULATORY_CARE_PROVIDER_SITE_OTHER): Payer: Medicare Other | Admitting: Family Medicine

## 2013-01-26 ENCOUNTER — Encounter: Payer: Self-pay | Admitting: Family Medicine

## 2013-01-26 VITALS — BP 124/80 | HR 97 | Temp 98.9°F | Wt 177.4 lb

## 2013-01-26 DIAGNOSIS — J209 Acute bronchitis, unspecified: Secondary | ICD-10-CM

## 2013-01-26 DIAGNOSIS — R062 Wheezing: Secondary | ICD-10-CM | POA: Diagnosis not present

## 2013-01-26 MED ORDER — ALBUTEROL SULFATE (2.5 MG/3ML) 0.083% IN NEBU
2.5000 mg | INHALATION_SOLUTION | Freq: Once | RESPIRATORY_TRACT | Status: AC
  Administered 2013-01-26: 2.5 mg via RESPIRATORY_TRACT

## 2013-01-26 MED ORDER — METHYLPREDNISOLONE ACETATE 80 MG/ML IJ SUSP
80.0000 mg | Freq: Once | INTRAMUSCULAR | Status: AC
  Administered 2013-01-26: 80 mg via INTRAMUSCULAR

## 2013-01-26 MED ORDER — PREDNISONE 10 MG PO TABS: ORAL_TABLET | ORAL | Status: DC

## 2013-01-26 MED ORDER — AZITHROMYCIN 250 MG PO TABS: ORAL_TABLET | ORAL | Status: DC

## 2013-01-26 NOTE — Progress Notes (Signed)
Subjective:     [name] is a 79 y.o. female here for evaluation of a cough. Onset of symptoms was 1 week ago. Symptoms have been gradually worsening since that time. The cough is productive and is aggravated by fumes, infection and reclining position. Associated symptoms include: postnasal drip, shortness of breath, sputum production and wheezing. Patient does not have a history of asthma. Patient does not have a history of environmental allergens. Patient has not traveled recently. Patient does not have a history of smoking. Patient has not had a previous chest x-ray. Patient has not had a PPD done.  The following portions of the patient's history were reviewed and updated as appropriate:  She  has a past medical history of Headache(784.0); Hyperlipidemia; Osteoporosis; CVA (cerebral infarction); Hemorrhoids; Hyperplastic colon polyp (06/2001); Diverticulitis; Varicose vein; DVT (deep venous thrombosis); Fracture (09/10/07); Cardiomyopathy; OSA (obstructive sleep apnea) (03-29-11); CAD (coronary artery disease); Hypertension; GERD (gastroesophageal reflux disease); Hiatal hernia (03-29-11); Depression with anxiety (03-29-11); OA (osteoarthritis) of knee (03-29-11); Stroke (03-29-11); E. coli gastroenteritis (03-29-11); Cancer; and Polycythemia. She  does not have any pertinent problems on file. She  has past surgical history that includes Appendectomy; Ovarian cyst surgery; Tonsillectomy; Back surgery; Tubal ligation; Dental Extraction; Kyphosis surgery (08/2007); Total knee arthroplasty (06/2010); Cholecystectomy (04/09/2011); Breast biopsy; Breast cyst excision; and Breast lumpectomy. Her family history includes Breast cancer in her mother and sister; Colon cancer in an other family member; Colon polyps in an other family member; Heart disease in her father; Leukemia in her brother. She  reports that she quit smoking about 24 years ago. She has never used smokeless tobacco. She reports that she  drinks about 0.6 ounces of alcohol per week. She reports that she does not use illicit drugs. She has a current medication list which includes the following prescription(s): acetaminophen-codeine, aspirin, hydrochlorothiazide, hydroxyurea, letrozole, lorazepam, metoprolol, omeprazole, rosuvastatin, and vitamin e. Current Outpatient Prescriptions on File Prior to Visit  Medication Sig Dispense Refill  . acetaminophen-codeine (TYLENOL #3) 300-30 MG per tablet Take 1 tablet by mouth every 8 (eight) hours as needed. For migraine  30 tablet  0  . aspirin 81 MG tablet Take 162 mg by mouth daily.      . hydrochlorothiazide (HYDRODIURIL) 25 MG tablet Take 1 tablet (25 mg total) by mouth daily as needed (swelling in the legs ).  30 tablet  1  . hydroxyurea (HYDREA) 500 MG capsule Take 500 mg by mouth every 3 (three) days. May take with food to minimize GI side effects.      Marland Kitchen letrozole (FEMARA) 2.5 MG tablet Take 1 tablet (2.5 mg total) by mouth daily.  90 tablet  3  . LORazepam (ATIVAN) 0.5 MG tablet TAKE 1 TABLET BY MOUTH TWICE DAILY AS NEEDED  60 tablet  2  . metoprolol (LOPRESSOR) 50 MG tablet TAKE (1/2) TABLET TWICE DAILY.  90 tablet  1  . omeprazole (PRILOSEC) 40 MG capsule Take 20 mg by mouth daily.       . rosuvastatin (CRESTOR) 5 MG tablet Take 5 mg by mouth every other day. Take one every other day      . vitamin E 400 UNIT capsule Take 400 Units by mouth daily.        No current facility-administered medications on file prior to visit.   She is allergic to atorvastatin; ezetimibe; fluvastatin sodium; magnesium hydroxide; penicillins; pneumovax; quinapril hcl; simvastatin; topamax; valsartan; vit d-vit e-safflower oil; and carvedilol..  Review of Systems Pertinent items are  noted in HPI.    Objective:    Oxygen saturation 96% on room air BP 124/80  Pulse 97  Temp(Src) 98.9 F (37.2 C) (Oral)  Wt 177 lb 6.4 oz (80.468 kg)  BMI 27.78 kg/m2  SpO2 96% General appearance: alert,  cooperative, appears stated age and no distress Ears: normal TM's and external ear canals both ears Nose: Nares normal. Septum midline. Mucosa normal. No drainage or sinus tenderness. Throat: lips, mucosa, and tongue normal; teeth and gums normal Neck: no adenopathy, no carotid bruit, no JVD, supple, symmetrical, trachea midline and thyroid not enlarged, symmetric, no tenderness/mass/nodules Lungs: wheezes bilaterally Heart: S1, S2 normal    Assessment:    Acute Bronchitis    Plan:    Antibiotics per medication orders. Antitussives per medication orders. Avoid exposure to tobacco smoke and fumes. Call if shortness of breath worsens, blood in sputum, change in character of cough, development of fever or chills, inability to maintain nutrition and hydration. Avoid exposure to tobacco smoke and fumes. pred taper

## 2013-01-26 NOTE — Patient Instructions (Signed)

## 2013-02-17 ENCOUNTER — Encounter: Payer: Self-pay | Admitting: Internal Medicine

## 2013-02-17 ENCOUNTER — Ambulatory Visit (INDEPENDENT_AMBULATORY_CARE_PROVIDER_SITE_OTHER): Payer: Medicare Other | Admitting: Internal Medicine

## 2013-02-17 VITALS — BP 153/82 | HR 87 | Temp 98.8°F | Wt 170.2 lb

## 2013-02-17 DIAGNOSIS — M810 Age-related osteoporosis without current pathological fracture: Secondary | ICD-10-CM | POA: Diagnosis not present

## 2013-02-17 DIAGNOSIS — I10 Essential (primary) hypertension: Secondary | ICD-10-CM

## 2013-02-17 DIAGNOSIS — R51 Headache: Secondary | ICD-10-CM | POA: Diagnosis not present

## 2013-02-17 DIAGNOSIS — Z0000 Encounter for general adult medical examination without abnormal findings: Secondary | ICD-10-CM

## 2013-02-17 MED ORDER — ACETAMINOPHEN-CODEINE #3 300-30 MG PO TABS: 1.0000 | ORAL_TABLET | Freq: Three times a day (TID) | ORAL | Status: DC | PRN

## 2013-02-17 NOTE — Assessment & Plan Note (Addendum)
BP slightly elevated today but very good at the hematology office, no change, last BMP within normal

## 2013-02-17 NOTE — Assessment & Plan Note (Addendum)
Never got a bone density test, she agreed to reschedule

## 2013-02-17 NOTE — Assessment & Plan Note (Addendum)
Never got a Zostavax, declined a flu shot today

## 2013-02-17 NOTE — Assessment & Plan Note (Signed)
Needs refill on Tylenol #3, UDS was low risk. Provide a refill

## 2013-02-17 NOTE — Progress Notes (Signed)
  Subjective:    Patient ID: [name], female    DOB: Jul 11, 1929, 79 y.o.   MRN: [id number]  HPI Routine office visit Medication list reviewed, good compliance. Was seen with URI 01/26/2013, status post antibiotics and prednisone, feels better except for the last 3 days when she developed left ear pain. She took leftover oxycodone that has helped the pain, ear pain is going away. H/o headaches, needs a refill on Tylenol #3.  Past Medical History:   Headache - migraines, codeine prn   Hypertension   Hyperlipidemia   Osteoporosis   Oncology Breast cancer 10-2011 Polycythemia HEART, CV:  --Mild nonobstructive disease on cath in 2003.   --Probable Takotsubo cardiomyopathy: Severe chest pain with normal cath in 1994. Severe chest pain in 2003 with widespread T wave inversions on ECG. Cath with minimal coronary disease but LV-gram showed periapical severe hypokinesis and basilar hyperkinesia (EF 40%). Last echo in 4/09 confirmed full LV functional recovery with EF 60%, no regional wall motion abnormalities, mild to moderate LVH.   H/O CVA   ----------------------------------------------   GI:  GERD Hiatal hernia   Hemorrhoids   Hyperplastic colon polyp, 06/2001   Functional LLQ pain   h/o diverticuli   -----------------------------------------------   Obstructive sleep apnea   Varicose veins   h/o R leg DVT after a venous ablation   OA with bilateral knee pain   4-09 L2 fracture status post vertebroplasty of L2 September 10, 2007 performed by IR   Depression/anxiety -lost husband    Past Surgical History:   Appendectomy   left ovarian cyst   3/07 - stress test neg   Tonsillectomy   Back Surgery   Tubal Ligation   Dental extraction L maxillary molar   Kyphoplasty 08/2007   left total knee replacement 06/2010   Cholecystectomy, 03-2011   L lumpectomy 11-2011  Social History: Married, lost husband 07-29-07.  Lives in [place]. lives in a town house, her daughter- GS live w/  her  driving   Former Smoker: quit 1990 Alcohol use-yes socially   children x 3 ( 2 in [place])  Review of Systems no ear discharge. No fever or chills No sore throat. Currently without headaches    Objective:   Physical Exam BP 153/82  Pulse 87  Temp(Src) 98.8 F (37.1 C)  Wt 170 lb 3.2 oz (77.202 kg)  BMI 26.65 kg/m2  SpO2 96% General -- alert, well-developed, NAD.  HEENT-- TMs normal, throat symmetric, no redness or discharge. Face symmetric, sinuses not tender to palpation. Nose not congested. Lungs -- normal respiratory effort, no intercostal retractions, no accessory muscle use, and normal breath sounds.  Heart-- normal rate, regular rhythm, no murmur.   Psych-- Cognition and judgment appear intact. Cooperative with normal attention span and concentration. No anxious appearing , no depressed appearing.      Assessment & Plan:  Left otalgia, Resolving, exam negative, recommend observation.

## 2013-02-23 ENCOUNTER — Encounter (INDEPENDENT_AMBULATORY_CARE_PROVIDER_SITE_OTHER): Payer: Self-pay | Admitting: General Surgery

## 2013-02-23 ENCOUNTER — Telehealth (INDEPENDENT_AMBULATORY_CARE_PROVIDER_SITE_OTHER): Payer: Self-pay

## 2013-02-23 ENCOUNTER — Ambulatory Visit (INDEPENDENT_AMBULATORY_CARE_PROVIDER_SITE_OTHER)
Admission: RE | Admit: 2013-02-23 | Discharge: 2013-02-23 | Disposition: A | Discharge status: Home / Self Care | Payer: Medicare Other | Source: Ambulatory Visit | Attending: Internal Medicine | Admitting: Internal Medicine

## 2013-02-23 ENCOUNTER — Ambulatory Visit (INDEPENDENT_AMBULATORY_CARE_PROVIDER_SITE_OTHER): Payer: Medicare Other | Admitting: General Surgery

## 2013-02-23 VITALS — BP 122/86 | HR 67 | Temp 97.4°F | Ht 67.0 in | Wt 173.0 lb

## 2013-02-23 DIAGNOSIS — M810 Age-related osteoporosis without current pathological fracture: Secondary | ICD-10-CM

## 2013-02-23 DIAGNOSIS — N611 Abscess of the breast and nipple: Secondary | ICD-10-CM

## 2013-02-23 NOTE — Telephone Encounter (Signed)
Patient calling into office this morning c/o breast pain.  Patient reports that it appears she has a breast abscess.  Patient had breast lumpectomy in July 2013.  Patient states she's having a lot of breast pain.  Patient denies having any fevers.  Patient scheduled for Bone Scan today @ 2:30 so she requested a appointment for later today due to conflict in scheduling.  Patient has urgent office appointment today @ 4:40 pm.  Patient also given a follow up appointment with Dr. Dwain Sarna on 02/27/13 @ 10:20am for recheck if needed.

## 2013-02-23 NOTE — Telephone Encounter (Signed)
I saw the pt leaving from the office with one of the assistants trying to convince the pt in staying for her appt with Dr Lindie Spruce b/c she was going to be next to be seen. I spoke to the pt and told her I was Dr Doreen Salvage assistant. I ask the pt why she was leaving without being seen b/c I thought she was here b/c of a breast abscess. The pt stated that she didn't feel good and she couldn't go thru with the exam that she just wanted to go home. The pt stated that she was in a lot of pain and that she could stand the thought of having anything aspirated now. I advised pt that I thought she should be evaluated today to see what was going on. The pt stated that she has been having ora's all day like she is going to have a migraine but she doesn't have the headache. I asked the pt again to be seen but she declined again. I told the pt that I was going to call her tomorrow and call Dr Dwain Sarna to see about getting her seen by him sooner. The pt stated that she has an appt on Friday and that she would just take pain medicine till then. I told her that was not a good plan b/c if this is an abscess she needs something done before Friday then just taking pain medicine. I asked the pt was she ok to drive and she said yes then walked out. I tried to call pt on her cell to make sure she got to the car ok but no answer.

## 2013-02-24 DIAGNOSIS — N611 Abscess of the breast and nipple: Secondary | ICD-10-CM | POA: Insufficient documentation

## 2013-02-24 NOTE — Telephone Encounter (Signed)
Called pt to check on her this a.m. From yesterday. The pt stated that she was feeling ok that she had a great night rest last night. The pt is still having pain at the breast site but does not want to be seen earlier. The pt just wants to keep her appt as planned with Dr Dwain Sarna for this Friday. I asked if the pt was running a fever and she said no. I advised pt that if her symptoms get worse that she will need to be seen before Friday. I did notify Dr Dwain Sarna of the pt wanting to wait till Friday's appt and he is ok with the plan.

## 2013-02-24 NOTE — Progress Notes (Signed)
Patient left prior to my being able to see her.  She was the next to be seen

## 2013-02-27 ENCOUNTER — Encounter (INDEPENDENT_AMBULATORY_CARE_PROVIDER_SITE_OTHER): Payer: Medicare Other | Admitting: General Surgery

## 2013-03-02 ENCOUNTER — Other Ambulatory Visit: Payer: Self-pay | Admitting: Internal Medicine

## 2013-03-02 NOTE — Telephone Encounter (Signed)
LORazepam (ATIVAN) 0.5 MG tablet Last OV: 02/17/2013 Last refill: 11/24/2012 #60, 2 refills UDS up-to-date

## 2013-03-06 ENCOUNTER — Telehealth: Payer: Self-pay | Admitting: *Deleted

## 2013-03-06 MED ORDER — LORAZEPAM 0.5 MG PO TABS: ORAL_TABLET | ORAL | Status: DC

## 2013-03-06 NOTE — Telephone Encounter (Signed)
done

## 2013-03-06 NOTE — Addendum Note (Signed)
Addended by: Willow Ora E on: 03/06/2013 05:45 PM   Modules accepted: Orders

## 2013-03-06 NOTE — Telephone Encounter (Signed)
Patient called and stated that she needs a refill on her Ativan.

## 2013-03-06 NOTE — Telephone Encounter (Signed)
Message copied by Eustace Quail on Fri Mar 06, 2013  5:41 PM ------      Message from: Willow Ora E      Created: Wed Mar 04, 2013  3:17 PM       Advise patient, she has osteoporosis.      In addition to calcium and vitamin D supplements daily needs medication.      Recommend Fosamax 70 mg weekly or Actonel monthly.      If she has problems swallowing let me know, also let me know if she is willing to take the medicationswe'll send a prescription. ------

## 2013-03-06 NOTE — Telephone Encounter (Signed)
Spoke with pt. States will call back to decide wether to try new medication for her osteoporosis. Has no problems swallowing. Also pt states been requesting for ativan been out for a few days now  rx request Ativan 0.5mg   Last ov- 02/17/13 Last refiled- 11/24/12 #60 / 2 rf Last UDS- low 08/18/12  Please advise DJR

## 2013-03-11 DIAGNOSIS — M25559 Pain in unspecified hip: Secondary | ICD-10-CM | POA: Diagnosis not present

## 2013-03-11 DIAGNOSIS — M545 Low back pain, unspecified: Secondary | ICD-10-CM | POA: Diagnosis not present

## 2013-03-12 ENCOUNTER — Other Ambulatory Visit (HOSPITAL_COMMUNITY): Payer: Self-pay | Admitting: Orthopaedic Surgery

## 2013-03-12 DIAGNOSIS — M7989 Other specified soft tissue disorders: Secondary | ICD-10-CM

## 2013-03-12 DIAGNOSIS — M25561 Pain in right knee: Secondary | ICD-10-CM

## 2013-03-17 ENCOUNTER — Ambulatory Visit (HOSPITAL_COMMUNITY)
Admission: RE | Admit: 2013-03-17 | Discharge: 2013-03-17 | Disposition: A | Discharge status: Home / Self Care | Payer: Medicare Other | Source: Ambulatory Visit | Attending: Orthopaedic Surgery | Admitting: Orthopaedic Surgery

## 2013-03-17 DIAGNOSIS — M79609 Pain in unspecified limb: Secondary | ICD-10-CM | POA: Diagnosis not present

## 2013-03-17 DIAGNOSIS — M7989 Other specified soft tissue disorders: Secondary | ICD-10-CM

## 2013-03-17 DIAGNOSIS — M25561 Pain in right knee: Secondary | ICD-10-CM

## 2013-03-17 NOTE — Progress Notes (Signed)
VASCULAR LAB PRELIMINARY  PRELIMINARY  PRELIMINARY  PRELIMINARY  Right lower extremity venous duplex completed.    Preliminary report:  Right:  No evidence of DVT, superficial thrombosis, or Baker's cyst.  Saia Derossett, RVS 03/17/2013, 10:31 AM

## 2013-03-25 DIAGNOSIS — M25559 Pain in unspecified hip: Secondary | ICD-10-CM | POA: Diagnosis not present

## 2013-03-27 ENCOUNTER — Ambulatory Visit (HOSPITAL_BASED_OUTPATIENT_CLINIC_OR_DEPARTMENT_OTHER): Payer: Medicare Other | Admitting: Hematology & Oncology

## 2013-03-27 ENCOUNTER — Other Ambulatory Visit (HOSPITAL_BASED_OUTPATIENT_CLINIC_OR_DEPARTMENT_OTHER): Payer: Medicare Other | Admitting: Lab

## 2013-03-27 VITALS — BP 137/59 | HR 71 | Temp 98.0°F | Resp 14 | Ht 67.0 in | Wt 173.0 lb

## 2013-03-27 DIAGNOSIS — D45 Polycythemia vera: Secondary | ICD-10-CM

## 2013-03-27 DIAGNOSIS — C50912 Malignant neoplasm of unspecified site of left female breast: Secondary | ICD-10-CM

## 2013-03-27 DIAGNOSIS — C50919 Malignant neoplasm of unspecified site of unspecified female breast: Secondary | ICD-10-CM

## 2013-03-27 DIAGNOSIS — D751 Secondary polycythemia: Secondary | ICD-10-CM

## 2013-03-27 LAB — COMPREHENSIVE METABOLIC PANEL
ALT: 12 U/L (ref 0–35)
AST: 18 U/L (ref 0–37)
Albumin: 3.9 g/dL (ref 3.5–5.2)
Alkaline Phosphatase: 103 U/L (ref 39–117)
BUN: 9 mg/dL (ref 6–23)
CO2: 29 mEq/L (ref 19–32)
Calcium: 9.7 mg/dL (ref 8.4–10.5)
Chloride: 101 mEq/L (ref 96–112)
Creatinine, Ser: 0.87 mg/dL (ref 0.50–1.10)
Glucose, Bld: 93 mg/dL (ref 70–99)
Potassium: 4.6 mEq/L (ref 3.5–5.3)
Sodium: 137 mEq/L (ref 135–145)
Total Bilirubin: 0.9 mg/dL (ref 0.3–1.2)
Total Protein: 6.9 g/dL (ref 6.0–8.3)

## 2013-03-27 LAB — CBC WITH DIFFERENTIAL (CANCER CENTER ONLY)
BASO#: 0 10*3/uL (ref 0.0–0.2)
BASO%: 0.6 % (ref 0.0–2.0)
EOS%: 3.5 % (ref 0.0–7.0)
Eosinophils Absolute: 0.2 10*3/uL (ref 0.0–0.5)
HCT: 43.1 % (ref 34.8–46.6)
HGB: 13.3 g/dL (ref 11.6–15.9)
LYMPH#: 1.3 10*3/uL (ref 0.9–3.3)
LYMPH%: 21.3 % (ref 14.0–48.0)
MCH: 24.7 pg — ABNORMAL LOW (ref 26.0–34.0)
MCHC: 30.9 g/dL — ABNORMAL LOW (ref 32.0–36.0)
MCV: 80 fL — ABNORMAL LOW (ref 81–101)
MONO#: 0.7 10*3/uL (ref 0.1–0.9)
MONO%: 11 % (ref 0.0–13.0)
NEUT#: 4 10*3/uL (ref 1.5–6.5)
NEUT%: 63.6 % (ref 39.6–80.0)
Platelets: 501 10*3/uL — ABNORMAL HIGH (ref 145–400)
RBC: 5.39 10*6/uL — ABNORMAL HIGH (ref 3.70–5.32)
RDW: 18.2 % — ABNORMAL HIGH (ref 11.1–15.7)
WBC: 6.3 10*3/uL (ref 3.9–10.0)

## 2013-03-27 LAB — LACTATE DEHYDROGENASE: LDH: 148 U/L (ref 94–250)

## 2013-03-27 NOTE — Progress Notes (Signed)
This office note has been dictated.

## 2013-03-30 LAB — IRON AND TIBC CHCC
%SAT: 5 % — ABNORMAL LOW (ref 21–57)
Iron: 20 ug/dL — ABNORMAL LOW (ref 41–142)
TIBC: 423 ug/dL (ref 236–444)
UIBC: 402 ug/dL — ABNORMAL HIGH (ref 120–384)

## 2013-03-30 LAB — FERRITIN CHCC: Ferritin: 27 ng/ml (ref 9–269)

## 2013-04-05 NOTE — Progress Notes (Signed)
CC:   Willow Ora, MD  DIAGNOSES: 1. Polycythemia vera - JAK2 positive. 2. Stage I (T1b N0 M0) infiltrating ductal carcinoma of the left     breast.  CURRENT THERAPY: 1. Hydrea 500 mg p.o. every third day (the patient to take this every     other day now). 2. Femara 2.5 mg p.o. daily. 3. Phlebotomy to maintain hematocrit below 45%.  INTERIM HISTORY:  [name] comes in for followup.  She had a fall, I think a couple of weeks ago.  She was in her garage.  Thankfully, she did not break anything.  She has had no problems with bleeding or bruising.  She has had no nausea or vomiting.  There has been no headache.  There has been no pain or burning in her hands or feet.  PHYSICAL EXAMINATION:  General:  This is a well-developed, well- nourished white female, in no obvious distress.  Vital Signs:  Show temperature 98, pulse 71, respiratory rate 14, blood pressure 137/59, weight is 173 pounds.  Head and Neck:  Shows a normocephalic, atraumatic skull.  There are no ocular or oral lesions.  There are no palpable cervical or supraclavicular lymph nodes.  Lungs:  Clear bilaterally. Cardiac:  Regular rate and rhythm with normal S1 and S2.  There are no murmurs, rubs, or bruits.  Abdomen:  Soft.  She has good bowel sounds. There is no fluid wave.  There is no palpable abdominal mass.  There is no palpable hepatosplenomegaly.  Extremities:  Show no clubbing, cyanosis, or edema.  She has good strength in her legs.  She had some osteoarthritic changes in her joints.  Skin:  Shows no ecchymoses or petechia.  Neurological:  No focal neurological deficits.  LABORATORY STUDIES:  White cell count is 6.3, hemoglobin 13.3, hematocrit 43.1, platelet count 501.  MCV is 80.  IMPRESSION:  [name] is an 79 year old white female with 2 separate issues.  She has both polycythemia vera and stage I infiltrating ductal carcinoma of the left breast.  So far, she has done well with both.  Of note, her  last mammogram was back in June.  This looked fine without any suspicious changes.  We will increase her Hydrea every other day now.  This will hopefully get her platelet count down a little bit.  When we last saw her in August, her ferritin was 22 and her iron saturation was 6%.  This is a sort what we want to see.  We will go ahead and plan to get her back in 2 months now.    ______________________________ Josph Macho, M.D. PRE/MEDQ  D:  03/27/2013  T:  04/04/2013  Job:  [id number]

## 2013-04-29 ENCOUNTER — Telehealth: Payer: Self-pay | Admitting: Cardiology

## 2013-04-29 NOTE — Telephone Encounter (Signed)
New problem    Pt called SOB/FATIGED needs an appt. Please.

## 2013-04-29 NOTE — Telephone Encounter (Signed)
Pt c/o SOB and fatigue x 2 weeks, HX: CAD EF 55-60% 12/2010 Pt denies cough, sore throat, no swelling in ankles or abdomen, denies weight gain, no CP. Pt states she has SOB all the time but worse with movement, voice sounds dry/ raspy.  Pt has not seen PCP/ no prior work up. Advised pt to start with PCP. Pt agreed to call PCP/ Dr Drue Novel.

## 2013-05-05 ENCOUNTER — Other Ambulatory Visit: Payer: Self-pay | Admitting: *Deleted

## 2013-05-05 MED ORDER — METOPROLOL TARTRATE 50 MG PO TABS: ORAL_TABLET | ORAL | Status: DC

## 2013-05-05 NOTE — Telephone Encounter (Signed)
rx refilled per protocol. DJR  

## 2013-05-06 ENCOUNTER — Other Ambulatory Visit: Payer: Self-pay | Admitting: *Deleted

## 2013-05-06 ENCOUNTER — Ambulatory Visit: Payer: Medicare Other | Admitting: Nurse Practitioner

## 2013-05-06 MED ORDER — HYDROXYUREA 500 MG PO CAPS: 500.0000 mg | ORAL_CAPSULE | ORAL | Status: DC

## 2013-05-08 ENCOUNTER — Other Ambulatory Visit: Payer: Self-pay | Admitting: Internal Medicine

## 2013-05-11 ENCOUNTER — Other Ambulatory Visit: Payer: Self-pay | Admitting: Internal Medicine

## 2013-05-12 NOTE — Telephone Encounter (Signed)
rx refilled per protocol. DJR  

## 2013-05-26 ENCOUNTER — Encounter: Payer: Self-pay | Admitting: Cardiology

## 2013-05-27 ENCOUNTER — Encounter: Payer: Self-pay | Admitting: Hematology & Oncology

## 2013-05-27 ENCOUNTER — Ambulatory Visit (HOSPITAL_BASED_OUTPATIENT_CLINIC_OR_DEPARTMENT_OTHER): Payer: Medicare Other | Admitting: Hematology & Oncology

## 2013-05-27 ENCOUNTER — Other Ambulatory Visit (HOSPITAL_BASED_OUTPATIENT_CLINIC_OR_DEPARTMENT_OTHER): Payer: Medicare Other | Admitting: Lab

## 2013-05-27 VITALS — BP 140/67 | HR 68 | Temp 98.1°F | Resp 14 | Ht 67.0 in | Wt 176.0 lb

## 2013-05-27 DIAGNOSIS — R300 Dysuria: Secondary | ICD-10-CM

## 2013-05-27 DIAGNOSIS — C50912 Malignant neoplasm of unspecified site of left female breast: Secondary | ICD-10-CM

## 2013-05-27 DIAGNOSIS — C50919 Malignant neoplasm of unspecified site of unspecified female breast: Secondary | ICD-10-CM | POA: Diagnosis not present

## 2013-05-27 DIAGNOSIS — D751 Secondary polycythemia: Secondary | ICD-10-CM

## 2013-05-27 DIAGNOSIS — R109 Unspecified abdominal pain: Secondary | ICD-10-CM

## 2013-05-27 DIAGNOSIS — D45 Polycythemia vera: Secondary | ICD-10-CM

## 2013-05-27 LAB — CBC WITH DIFFERENTIAL (CANCER CENTER ONLY)
BASO#: 0.1 10*3/uL (ref 0.0–0.2)
BASO%: 0.8 % (ref 0.0–2.0)
EOS%: 3.1 % (ref 0.0–7.0)
Eosinophils Absolute: 0.2 10*3/uL (ref 0.0–0.5)
HCT: 42.5 % (ref 34.8–46.6)
HGB: 12.8 g/dL (ref 11.6–15.9)
LYMPH#: 1.3 10*3/uL (ref 0.9–3.3)
LYMPH%: 19.6 % (ref 14.0–48.0)
MCH: 24.3 pg — ABNORMAL LOW (ref 26.0–34.0)
MCHC: 30.1 g/dL — ABNORMAL LOW (ref 32.0–36.0)
MCV: 81 fL (ref 81–101)
MONO#: 0.5 10*3/uL (ref 0.1–0.9)
MONO%: 8 % (ref 0.0–13.0)
NEUT#: 4.4 10*3/uL (ref 1.5–6.5)
NEUT%: 68.5 % (ref 39.6–80.0)
Platelets: 387 10*3/uL (ref 145–400)
RBC: 5.27 10*6/uL (ref 3.70–5.32)
RDW: 16.9 % — ABNORMAL HIGH (ref 11.1–15.7)
WBC: 6.4 10*3/uL (ref 3.9–10.0)

## 2013-05-27 LAB — URINALYSIS, MICROSCOPIC (CHCC SATELLITE)
Bacteria, UA: NEGATIVE
Bilirubin (Urine): NEGATIVE
Blood: NEGATIVE
Glucose: NEGATIVE mg/dL
Ketones: NEGATIVE mg/dL
Nitrite: NEGATIVE
Protein: NEGATIVE mg/dL
Specific Gravity, Urine: 1.01 (ref 1.003–1.035)
Urobilinogen, UR: 0.2 mg/dL (ref 0.2–1)
pH: 6 (ref 4.60–8.00)

## 2013-05-27 LAB — COMPREHENSIVE METABOLIC PANEL
ALT: 10 U/L (ref 0–35)
AST: 17 U/L (ref 0–37)
Albumin: 4.2 g/dL (ref 3.5–5.2)
Alkaline Phosphatase: 89 U/L (ref 39–117)
BUN: 8 mg/dL (ref 6–23)
CO2: 31 mEq/L (ref 19–32)
Calcium: 9.6 mg/dL (ref 8.4–10.5)
Chloride: 98 mEq/L (ref 96–112)
Creatinine, Ser: 0.69 mg/dL (ref 0.50–1.10)
Glucose, Bld: 90 mg/dL (ref 70–99)
Potassium: 4.2 mEq/L (ref 3.5–5.3)
Sodium: 135 mEq/L (ref 135–145)
Total Bilirubin: 1.2 mg/dL (ref 0.3–1.2)
Total Protein: 6.9 g/dL (ref 6.0–8.3)

## 2013-05-27 LAB — CHCC SATELLITE - SMEAR

## 2013-05-27 NOTE — Progress Notes (Signed)
This office note has been dictated.

## 2013-05-28 NOTE — Progress Notes (Signed)
CC:   Kathlene November, MD  DIAGNOSES: 1. Polycythemia vera - JAK2 positive. 2. Stage I (T1b N0 M0) ductal carcinoma of the left breast.  CURRENT THERAPY: 1. Hydrea 500 mg p.o. every other day. 2. Phlebotomy to maintain hematocrit below 45%. 3. Femara 2.5 mg p.o. daily.  INTERIM HISTORY:  Ms. Mclamb comes in for followup.  She is having some issue with abdominal pain.  This mostly on the right side. This has been going on for about a week or so.  It is associated with a little bit of nausea, but no vomiting.  There may be a little bit of dysuria with this.  She has had no hematuria.  She has had no diarrhea or constipation.  There has been no fever.  She has had a little bit of a cough.  She has a little shortness of breath.  She has had no dysphagia or odynophagia.  She has not noticed any kind of leg swelling.  We adjusted her dose of Hydrea to take every other day when we saw her back in November.  She has tolerated this fairly well.  When we last saw her in November, her ferritin was 27 with an iron saturation of 5%.  PHYSICAL EXAMINATION:  General: This is an elderly well-developed, well- nourished white female, in no obvious distress.  Vital Signs:  Show temperature of 98.1, pulse 68, respiratory rate 14, blood pressure 140/67, weight is 176 pounds.  Head and Neck Exam:  Shows a normocephalic, atraumatic skull.  There are no ocular or oral lesions. There are no palpable cervical or supraclavicular lymph nodes.  Lungs: Clear bilaterally.  Cardiac Exam:  Regular rate and rhythm with a normal S1, S2.  She has no murmurs, rubs, or bruits.  Breast Exam:  Shows right breast with no masses, edema, or erythema.  There is no right axillary adenopathy.  Left breast shows well-healed lumpectomy at the 2 o'clock position.  There is some slight firmness at the lumpectomy site.  No distinct mass is noted in the left breast.  There is no left axillary adenopathy.  Abdomen:  Soft.  There is  some slight tenderness in the right upper quadrant.  She has a well-healed scar from her cholecystectomy.  There is no fluid wave.  I cannot palpate her liver edge.  There is no splenomegaly.  I cannot palpate any inguinal adenopathy.  Extremities:  Show no clubbing, cyanosis, or edema.  She has decreased range of motion with the joints.  She has good strength in her arms and legs.  Skin Exam:  Shows some hyperpigmented lesions, which do not appear suspicious.  LABORATORY STUDIES:  White cell count is 6.4, hemoglobin 12.8, hematocrit 42.5,  and platelet count is 387.  IMPRESSION:  Ms. Snowden is an 80 year old white female.  She has polycythemia.  Again, she is JAK2 positive.  The increased Hydrea dose has really helped.  She does not need to be phlebotomized.  I am not sure what this abdominal pain is all about.  She had a CT scan done back in May of last year.  This was unremarkable. I do think we probably need to repeat a CT scan on her.  She certainly is at risk for breast cancer recurrence or some changes with her polycythemia.  We did do a urinalysis on her.  The results of this are pending right now.  Again, we will get the CT scan done this week.  I want to get her back  in 1 month, so that we can followup with her issues.    ______________________________ Volanda Napoleon, M.D. PRE/MEDQ  D:  05/27/2013  T:  05/28/2013  Job:  [id number]

## 2013-05-29 ENCOUNTER — Ambulatory Visit (HOSPITAL_BASED_OUTPATIENT_CLINIC_OR_DEPARTMENT_OTHER)
Admission: RE | Admit: 2013-05-29 | Discharge: 2013-05-29 | Disposition: A | Discharge status: Home / Self Care | Payer: Medicare Other | Source: Ambulatory Visit | Attending: Hematology & Oncology | Admitting: Hematology & Oncology

## 2013-05-29 ENCOUNTER — Encounter (HOSPITAL_BASED_OUTPATIENT_CLINIC_OR_DEPARTMENT_OTHER): Payer: Self-pay

## 2013-05-29 DIAGNOSIS — K449 Diaphragmatic hernia without obstruction or gangrene: Secondary | ICD-10-CM | POA: Insufficient documentation

## 2013-05-29 DIAGNOSIS — D45 Polycythemia vera: Secondary | ICD-10-CM | POA: Insufficient documentation

## 2013-05-29 DIAGNOSIS — D259 Leiomyoma of uterus, unspecified: Secondary | ICD-10-CM | POA: Insufficient documentation

## 2013-05-29 DIAGNOSIS — R109 Unspecified abdominal pain: Secondary | ICD-10-CM | POA: Insufficient documentation

## 2013-05-29 DIAGNOSIS — K5730 Diverticulosis of large intestine without perforation or abscess without bleeding: Secondary | ICD-10-CM | POA: Insufficient documentation

## 2013-05-29 DIAGNOSIS — C50919 Malignant neoplasm of unspecified site of unspecified female breast: Secondary | ICD-10-CM | POA: Insufficient documentation

## 2013-05-29 DIAGNOSIS — D751 Secondary polycythemia: Secondary | ICD-10-CM

## 2013-05-29 MED ORDER — IOHEXOL 300 MG/ML  SOLN
100.0000 mL | Freq: Once | INTRAMUSCULAR | Status: AC | PRN
  Administered 2013-05-29: 100 mL via INTRAVENOUS

## 2013-06-01 ENCOUNTER — Other Ambulatory Visit: Payer: Self-pay | Admitting: Internal Medicine

## 2013-06-02 ENCOUNTER — Telehealth: Payer: Self-pay | Admitting: *Deleted

## 2013-06-02 MED ORDER — LORAZEPAM 0.5 MG PO TABS: ORAL_TABLET | ORAL | Status: DC

## 2013-06-02 NOTE — Telephone Encounter (Addendum)
Message copied by Orlando Penner on Tue Jun 02, 2013  3:18 PM ------      Message from: Volanda Napoleon      Created: Sun May 31, 2013  7:53 PM       Call - you have a large hiatal hernia.  Need to see primary MD to have this looked into.  Pete ------This message given to pt.  Also mailed her a copy of the scan as requested.

## 2013-06-02 NOTE — Telephone Encounter (Signed)
LORazepam (ATIVAN) 0.5 MG tablet Last refill: 03/06/13 #60, 2 refills Last OV: 02/17/13 Low risk, UDS due

## 2013-06-02 NOTE — Telephone Encounter (Signed)
rx printed, ask pt to come for a UDS at her earliest convenience

## 2013-06-03 ENCOUNTER — Encounter: Payer: Self-pay | Admitting: Internal Medicine

## 2013-06-03 ENCOUNTER — Ambulatory Visit (INDEPENDENT_AMBULATORY_CARE_PROVIDER_SITE_OTHER): Payer: Medicare Other | Admitting: Internal Medicine

## 2013-06-03 VITALS — BP 162/82 | HR 71 | Temp 98.0°F | Wt 176.0 lb

## 2013-06-03 DIAGNOSIS — K219 Gastro-esophageal reflux disease without esophagitis: Secondary | ICD-10-CM

## 2013-06-03 DIAGNOSIS — M810 Age-related osteoporosis without current pathological fracture: Secondary | ICD-10-CM

## 2013-06-03 NOTE — Patient Instructions (Addendum)
Next visit by April 2015, please make an appointment          Hiatal Hernia A hiatal hernia occurs when a part of the stomach slides above the diaphragm. The diaphragm is the thin muscle separating the belly (abdomen) from the chest. A hiatal hernia can be something you are born with or develop over time. Hiatal hernias may allow stomach acid to flow back into your esophagus, the tube which carries food from your mouth to your stomach. If this acid causes problems it is called GERD (gastro-esophageal reflux disease).  SYMPTOMS  Common symptoms of GERD are heartburn (burning in your chest). This is worse when lying down or bending over. It may also cause belching and indigestion. Some of the things which make GERD worse are:  Increased weight pushes on stomach making acid rise more easily.  Smoking markedly increases acid production.  Alcohol decreases lower esophageal sphincter pressure (valve between stomach and esophagus), allowing acid from stomach into esophagus.  Late evening meals and going to bed with a full stomach increases pressure.  Anything that causes an increase in acid production.  Lower esophageal sphincter incompetence. DIAGNOSIS  Hiatal hernia is often diagnosed with x-rays of your stomach and small bowel. This is called an UGI (upper gastrointestinal x-ray). Sometimes a gastroscopic procedure is done. This is a procedure where your caregiver uses a flexible instrument to look into the stomach and small bowel. HOME CARE INSTRUCTIONS   Try to achieve and maintain an ideal body weight.  Avoid drinking alcoholic beverages.  Stop smoking.  Put the head of your bed on 4 to 6 inch blocks. This will keep your head and esophagus higher than your stomach. If you cannot use blocks, sleep with several pillows under your head and shoulders.  Over-the-counter medications will decrease acid production. Your caregiver can also prescribe medications for this. Take as  directed.  1/2 to 1 teaspoon of an antacid taken every hour while awake, with meals and at bedtime, will neutralize acid.  Do not take aspirin, ibuprofen (Advil or Motrin), or other nonsteroidal anti-inflammatory drugs.  Do not wear tight clothing around your chest or stomach.  Eat smaller meals and eat more frequently. This keeps your stomach from getting too full. Eat slowly.  Do not lie down for 2 or 3 hours after eating. Do not eat or drink anything 1 to 2 hours before going to bed.  Avoid caffeine beverages (colas, coffee, cocoa, tea), fatty foods, citrus fruits and all other foods and drinks that contain acid and that seem to increase the problems.  Avoid bending over, especially after eating. Also avoid straining during bowel movements or when urinating or lifting things. Anything that increases the pressure in your belly increases the amount of acid that may be pushed up into your esophagus. SEEK IMMEDIATE MEDICAL CARE IF:  There is change in location (pain in arms, neck, jaw, teeth or back) of your pain, or the pain is getting worse.  You also experience nausea, vomiting, sweating (diaphoresis), or shortness of breath.  You develop continual vomiting, vomit blood or coffee ground material, have bright red blood in your stools, or have black tarry stools. Some of these symptoms could signal other problems such as heart disease. MAKE SURE YOU:   Understand these instructions.  Monitor your condition.  Contact your caregiver if you are not doing well or are getting worse. Document Released: 07/21/2003 Document Revised: 07/23/2011 Document Reviewed: 04/30/2005 [name] Patient Information 2014 [place], [place].

## 2013-06-03 NOTE — Assessment & Plan Note (Signed)
Bone density test 02-2013 showed T score of -2.8, she was offered treatment

## 2013-06-03 NOTE — Assessment & Plan Note (Signed)
Recent CT of the abdomen show a large hiatal hernia, records reviewed, she is known to have a hiatal hernia since at least 2003 (via CT) She is essentially asymptomatic, I don't think she needs further aggressive evaluation,  It did warned her about worrisome symptoms such as dysphagia, odynophagia, chest pain, nausea, etc Recommend to increase Prilosec from 20 mg to 40 mg by mouth.

## 2013-06-03 NOTE — Progress Notes (Signed)
Pre visit review using our clinic review tool, if applicable. No additional management support is needed unless otherwise documented below in the visit note. 

## 2013-06-03 NOTE — Progress Notes (Signed)
   Subjective:    Patient ID: [name], female    DOB: 1929-09-19, 80 y.o.   MRN: [id number]  HPI Here to discuss recent abdominal CT. Abdominal CT showed a large hiatal hernia In general she feels about the same, still tired and occasionally shortness of breath  Past Medical History:   Headache - migraines, codeine prn   Hypertension   Hyperlipidemia   Osteoporosis   Oncology Breast cancer 10-2011 Polycythemia HEART, CV:  --Mild nonobstructive disease on cath in 2003.   --Probable Takotsubo cardiomyopathy: Severe chest pain with normal cath in 1994. Severe chest pain in 2003 with widespread T wave inversions on ECG. Cath with minimal coronary disease but LV-gram showed periapical severe hypokinesis and basilar hyperkinesia (EF 40%). Last echo in 4/09 confirmed full LV functional recovery with EF 60%, no regional wall motion abnormalities, mild to moderate LVH.   H/O CVA   ----------------------------------------------   GI:  GERD Hiatal hernia   Hemorrhoids   Hyperplastic colon polyp, 06/2001   Functional LLQ pain   h/o diverticuli   -----------------------------------------------   Obstructive sleep apnea   Varicose veins   h/o R leg DVT after a venous ablation   OA with bilateral knee pain   4-09 L2 fracture status post vertebroplasty of L2 September 10, 2007 performed by IR   Depression/anxiety -lost husband    Past Surgical History:   Appendectomy   left ovarian cyst   3/07 - stress test neg   Tonsillectomy   Back Surgery   Tubal Ligation   Dental extraction L maxillary molar   Kyphoplasty 08/2007   left total knee replacement 06/2010   Cholecystectomy, 03-2011   L lumpectomy 11-2011  Social History: Married, lost husband 07-29-07.  Lives in [place]. lives in a town house, her daughter- [place] live w/ her  driving   Former Smoker: quit 1990 Alcohol use-yes socially   children x 3 ( 2 in [place])  Review of Systems Denies dysphasia or odynophagia. No weight  loss. She has GERD, takes one OTC Prilosec, symptoms are usually well controlled, occasionally at night when she lays down she has a ill-defined upper abdominal discomfort. Denies exertional chest pain, she remains active doing water aerobics    Objective:   Physical Exam BP 162/82  Pulse 71  Temp(Src) 98 F (36.7 C)  Wt 176 lb (79.833 kg)  SpO2 97% General -- alert, well-developed, NAD.   HEENT-- Not pale.   Abdomen-- Not distended, good bowel sounds,soft, non-tender. No rebound or rigidity. No mass,organomegaly.  Psych-- Cognition and judgment appear intact. Cooperative with normal attention span and concentration. No anxious or depressed appearing.         Assessment & Plan:

## 2013-06-22 DIAGNOSIS — M545 Low back pain, unspecified: Secondary | ICD-10-CM | POA: Diagnosis not present

## 2013-06-25 ENCOUNTER — Encounter: Payer: Self-pay | Admitting: Hematology & Oncology

## 2013-06-25 ENCOUNTER — Ambulatory Visit: Payer: Medicare Other

## 2013-06-25 ENCOUNTER — Other Ambulatory Visit (HOSPITAL_BASED_OUTPATIENT_CLINIC_OR_DEPARTMENT_OTHER): Payer: Medicare Other | Admitting: Lab

## 2013-06-25 ENCOUNTER — Ambulatory Visit (HOSPITAL_BASED_OUTPATIENT_CLINIC_OR_DEPARTMENT_OTHER): Payer: Medicare Other | Admitting: Hematology & Oncology

## 2013-06-25 VITALS — BP 136/57 | HR 61 | Temp 97.5°F | Resp 14 | Ht 67.0 in | Wt 175.0 lb

## 2013-06-25 DIAGNOSIS — C50919 Malignant neoplasm of unspecified site of unspecified female breast: Secondary | ICD-10-CM

## 2013-06-25 DIAGNOSIS — D45 Polycythemia vera: Secondary | ICD-10-CM | POA: Diagnosis not present

## 2013-06-25 DIAGNOSIS — D751 Secondary polycythemia: Secondary | ICD-10-CM

## 2013-06-25 DIAGNOSIS — R109 Unspecified abdominal pain: Secondary | ICD-10-CM

## 2013-06-25 LAB — CBC WITH DIFFERENTIAL (CANCER CENTER ONLY)
BASO#: 0.1 10*3/uL (ref 0.0–0.2)
BASO%: 0.8 % (ref 0.0–2.0)
EOS%: 2.7 % (ref 0.0–7.0)
Eosinophils Absolute: 0.2 10*3/uL (ref 0.0–0.5)
HCT: 41.5 % (ref 34.8–46.6)
HGB: 12.5 g/dL (ref 11.6–15.9)
LYMPH#: 1.5 10*3/uL (ref 0.9–3.3)
LYMPH%: 20.1 % (ref 14.0–48.0)
MCH: 24.9 pg — ABNORMAL LOW (ref 26.0–34.0)
MCHC: 30.1 g/dL — ABNORMAL LOW (ref 32.0–36.0)
MCV: 83 fL (ref 81–101)
MONO#: 0.7 10*3/uL (ref 0.1–0.9)
MONO%: 9 % (ref 0.0–13.0)
NEUT#: 4.9 10*3/uL (ref 1.5–6.5)
NEUT%: 67.4 % (ref 39.6–80.0)
Platelets: 379 10*3/uL (ref 145–400)
RBC: 5.03 10*6/uL (ref 3.70–5.32)
RDW: 16.7 % — ABNORMAL HIGH (ref 11.1–15.7)
WBC: 7.3 10*3/uL (ref 3.9–10.0)

## 2013-06-25 LAB — CMP (CANCER CENTER ONLY)
ALT(SGPT): 13 U/L (ref 10–47)
AST: 22 U/L (ref 11–38)
Albumin: 3.5 g/dL (ref 3.3–5.5)
Alkaline Phosphatase: 85 U/L — ABNORMAL HIGH (ref 26–84)
BUN, Bld: 8 mg/dL (ref 7–22)
CO2: 32 mEq/L (ref 18–33)
Calcium: 9.3 mg/dL (ref 8.0–10.3)
Chloride: 101 mEq/L (ref 98–108)
Creat: 0.7 mg/dl (ref 0.6–1.2)
Glucose, Bld: 123 mg/dL — ABNORMAL HIGH (ref 73–118)
Potassium: 4.4 mEq/L (ref 3.3–4.7)
Sodium: 138 mEq/L (ref 128–145)
Total Bilirubin: 1.1 mg/dl (ref 0.20–1.60)
Total Protein: 6.8 g/dL (ref 6.4–8.1)

## 2013-06-25 NOTE — Progress Notes (Signed)
Pt seen by Dr. Marin Olp, Hct 41.5, no phlebotomy needed.

## 2013-06-25 NOTE — Progress Notes (Signed)
Hematology and Oncology Follow Up Visit  [name] [id number] 12-07-1929 80 y.o. 06/25/2013   Principle Diagnosis:   Polycythemia vera-JAK2 positive  Stage I (T1BN0M0 ) infiltrating duct carcinoma the left breast     Current therapy: #1 phlebotomy to maintain hematocrit below 45%.  #2 Hydrea 500 mg by mouth every other day.  #3 Femara 2.5 mg by mouth daily     Interim History:  [name] is is back for followup. We last saw her back in January. She's been doing pretty good. She unfortunately had to d go last week. This was a tough on her.  She's had no issues with respect to her polycythemia or breast cancer.  Her last ferritin was 27 and an iron saturation of 5%.  She's had no rashes. There's been no cough shortness of breath.  She's not due for a mammogram until June.  Medications: Current outpatient prescriptions:acetaminophen-codeine (TYLENOL #3) 300-30 MG per tablet, Take 1 tablet by mouth every 8 (eight) hours as needed. For migraine, Disp: 60 tablet, Rfl: 0;  aspirin 81 MG tablet, Take 162 mg by mouth daily., Disp: , Rfl: ;  hydrochlorothiazide (HYDRODIURIL) 25 MG tablet, Take 1 tablet (25 mg total) by mouth daily as needed (swelling in the legs )., Disp: 30 tablet, Rfl: 1 hydroxyurea (HYDREA) 500 MG capsule, Take 1 capsule (500 mg total) by mouth every other day. May take with food to minimize GI side effects., Disp: 90 capsule, Rfl: 0;  letrozole (FEMARA) 2.5 MG tablet, Take 1 tablet (2.5 mg total) by mouth daily., Disp: 90 tablet, Rfl: 3;  LORazepam (ATIVAN) 0.5 MG tablet, TAKE 1 TABLET BY MOUTH TWICE DAILY AS NEEDED, Disp: 60 tablet, Rfl: 4;  MELOXICAM PO, Take 15 mg by mouth every morning., Disp: , Rfl:  metoprolol (LOPRESSOR) 50 MG tablet, TAKE (1/2) TABLET TWICE DAILY., Disp: 90 tablet, Rfl: 1;  omeprazole (PRILOSEC OTC) 20 MG tablet, Take 40 mg by mouth daily., Disp: , Rfl: ;  oxyCODONE-acetaminophen (PERCOCET/ROXICET) 5-325 MG per tablet, Take 1 tablet by  mouth every 6 (six) hours as needed for severe pain., Disp: , Rfl: ;  Probiotic Product (PROBIOTIC DAILY PO), Take by mouth every morning., Disp: , Rfl:  rosuvastatin (CRESTOR) 5 MG tablet, Take 5 mg by mouth every other day. Take one every other day, Disp: , Rfl: ;  vitamin E 400 UNIT capsule, Take 400 Units by mouth daily. , Disp: , Rfl:   Allergies:  Allergies  Allergen Reactions  . Atorvastatin Diarrhea    REACTION: diarrhe  . Ezetimibe     REACTION: nausea  . Fluvastatin Sodium Other (See Comments)    "Can't remember"  . Magnesium Hydroxide Other (See Comments)    REACTION: triggers HAs  . Penicillins Other (See Comments)    Mouth blisters 03-29-11  . Pneumovax [Pneumococcal Polysaccharides]     Local reaction  . Quinapril Hcl Other (See Comments)    03-29-11 "feelings of tiredness"  . Simvastatin Diarrhea  . Topamax [Topiramate] Itching  . Valsartan Itching and Other (See Comments)    REACTION: toes itch  . Vit D-Vit E-Safflower Oil Other (See Comments)    Headaches  . Carvedilol     "teeth hurt when I took it"    Past Medical History, Surgical history, Social history, and Family History were reviewed and updated.  Review of Systems: As above  Physical Exam:  height is 5\' 7"  (1.702 m) and weight is 175 lb (79.379 kg). Her oral temperature is 97.5  F (36.4 C). Her blood pressure is 136/57 and her pulse is 61. Her respiration is 14.   Totally white female in no obvious distress. Head and neck exam shows no scleral icterus. There are no oral lesions. She has no adenopathy in the neck. Lungs are clear. Cardiac exam regular rate and rhythm with no murmurs rubs or bruits. Breast exam shows right breast with no masses. There is no edema in the right breast. There is no right axillary adenopathy. Left breast shows the lobectomy at the 2:00 position. This is firm. No masses noted. There is no left axillary adenopathy. Abdomen is soft. There is no palpable hepato- splenomegaly.  Extremities shows no lymphedema. She is age related atrophic changes. Skin exam no rashes.  Lab Results  Component Value Date   WBC 7.3 06/25/2013   HGB 12.5 06/25/2013   HCT 41.5 06/25/2013   MCV 83 06/25/2013   PLT 379 06/25/2013     Chemistry      Component Value Date/Time   NA 138 06/25/2013 1356   NA 135 05/27/2013 1314   K 4.4 06/25/2013 1356   K 4.2 05/27/2013 1314   CL 101 06/25/2013 1356   CL 98 05/27/2013 1314   CO2 32 06/25/2013 1356   CO2 31 05/27/2013 1314   BUN 8 06/25/2013 1356   BUN 8 05/27/2013 1314   CREATININE 0.7 06/25/2013 1356   CREATININE 0.69 05/27/2013 1314      Component Value Date/Time   CALCIUM 9.3 06/25/2013 1356   CALCIUM 9.6 05/27/2013 1314   ALKPHOS 85* 06/25/2013 1356   ALKPHOS 89 05/27/2013 1314   AST 22 06/25/2013 1356   AST 17 05/27/2013 1314   ALT 13 06/25/2013 1356   ALT 10 05/27/2013 1314   BILITOT 1.10 06/25/2013 1356   BILITOT 1.2 05/27/2013 1314         Impression and Plan: [name] is an 80 year old white female. She has polycythemia. This is jJAK2 positive. She does not need be phlebotomized.  Her breast cancer is not much of a problem. It is early stage.  I think we can get her back in 3 months now.  Volanda Napoleon, MD 2/12/20152:59 PM

## 2013-06-30 ENCOUNTER — Ambulatory Visit: Payer: Medicare Other | Admitting: Physical Therapy

## 2013-07-06 ENCOUNTER — Ambulatory Visit: Payer: Medicare Other | Attending: Orthopaedic Surgery | Admitting: Physical Therapy

## 2013-07-06 DIAGNOSIS — Z96659 Presence of unspecified artificial knee joint: Secondary | ICD-10-CM | POA: Insufficient documentation

## 2013-07-06 DIAGNOSIS — M810 Age-related osteoporosis without current pathological fracture: Secondary | ICD-10-CM | POA: Diagnosis not present

## 2013-07-06 DIAGNOSIS — IMO0001 Reserved for inherently not codable concepts without codable children: Secondary | ICD-10-CM | POA: Diagnosis not present

## 2013-07-06 DIAGNOSIS — M25569 Pain in unspecified knee: Secondary | ICD-10-CM | POA: Insufficient documentation

## 2013-07-06 DIAGNOSIS — Z981 Arthrodesis status: Secondary | ICD-10-CM | POA: Diagnosis not present

## 2013-07-06 DIAGNOSIS — I10 Essential (primary) hypertension: Secondary | ICD-10-CM | POA: Diagnosis not present

## 2013-07-13 ENCOUNTER — Ambulatory Visit: Payer: Medicare Other | Admitting: Physical Therapy

## 2013-07-24 IMAGING — CR DG CHEST 2V
2 series · 2 of 2 positions shown · non-contrast
Comparison: 06/16/2010.

CLINICAL DATA: Chest pain.

CHEST - 2 VIEW

[w chest pa]
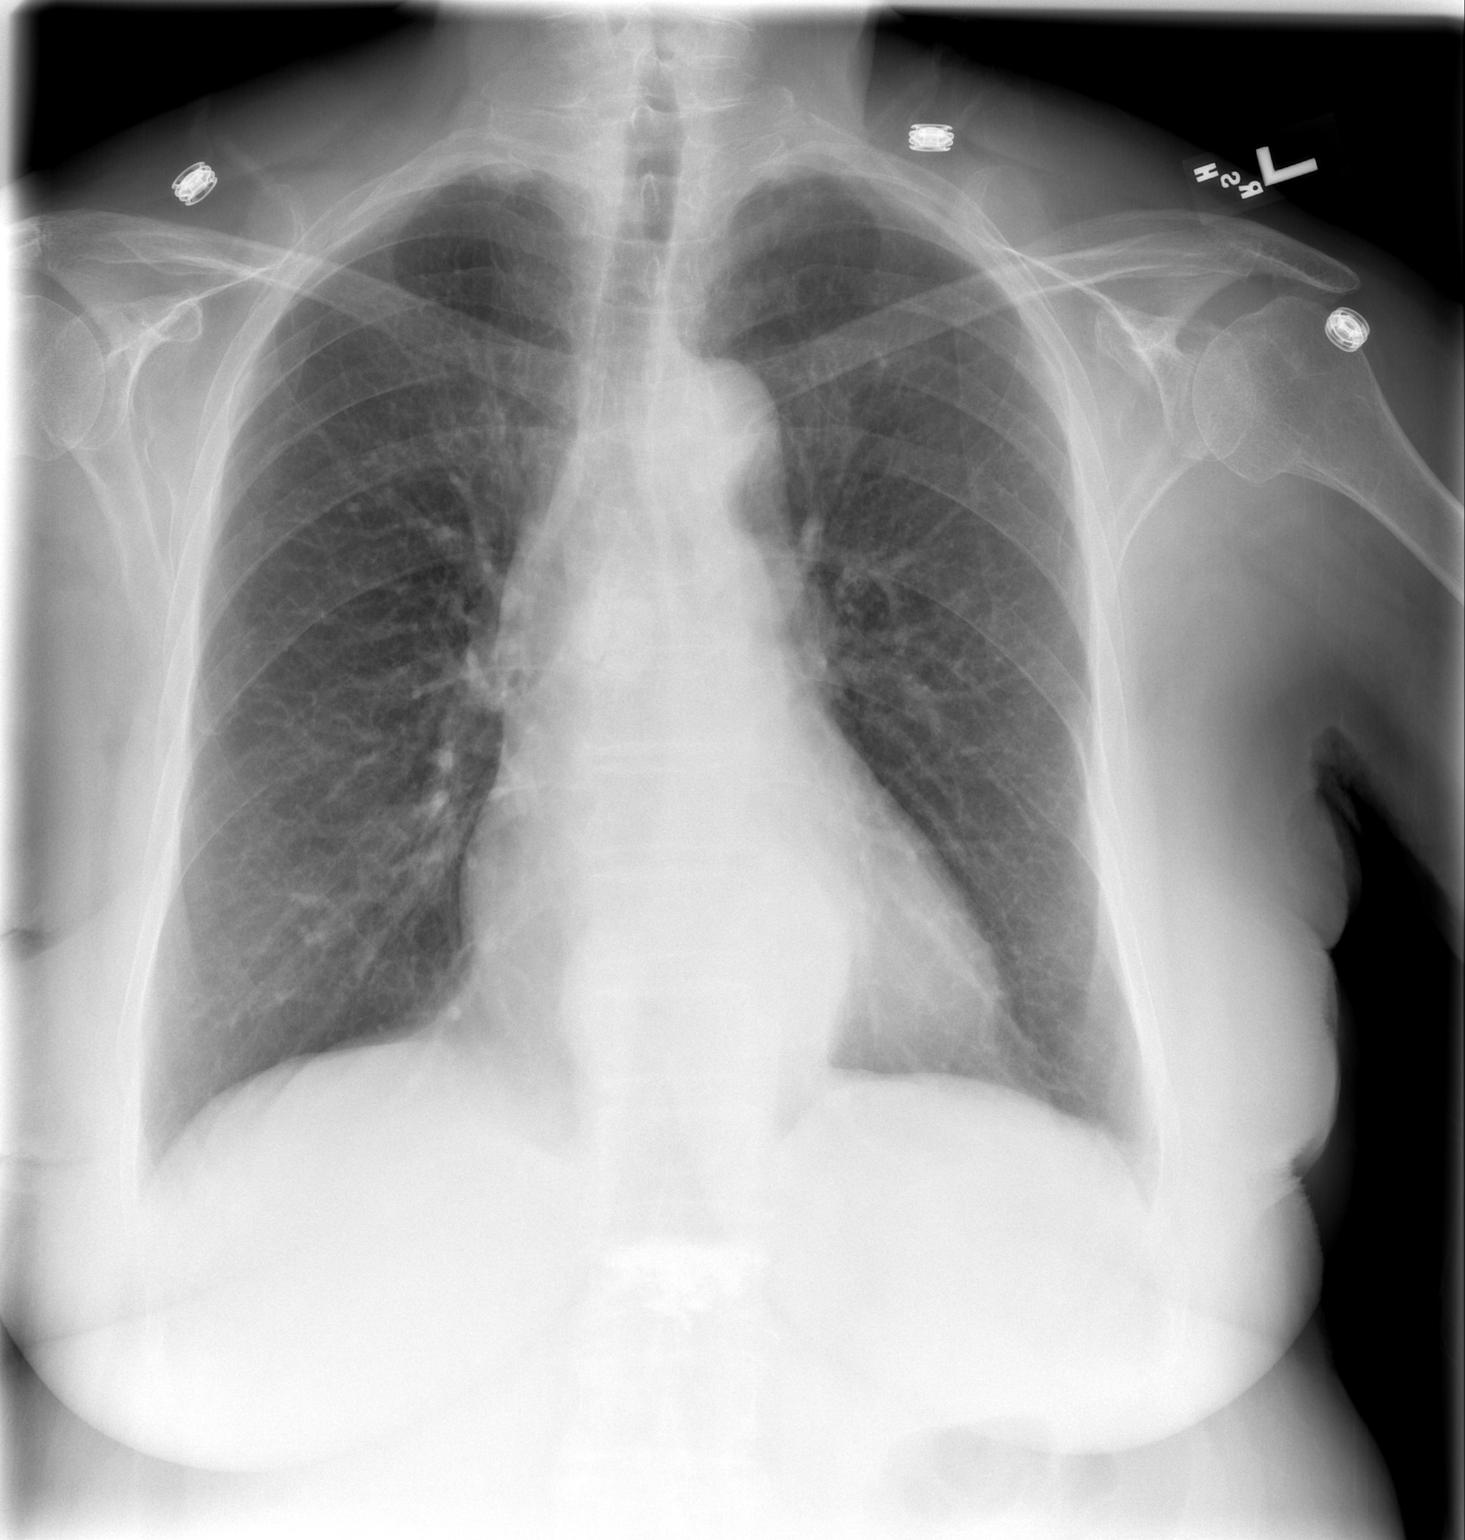

[w chest lat]
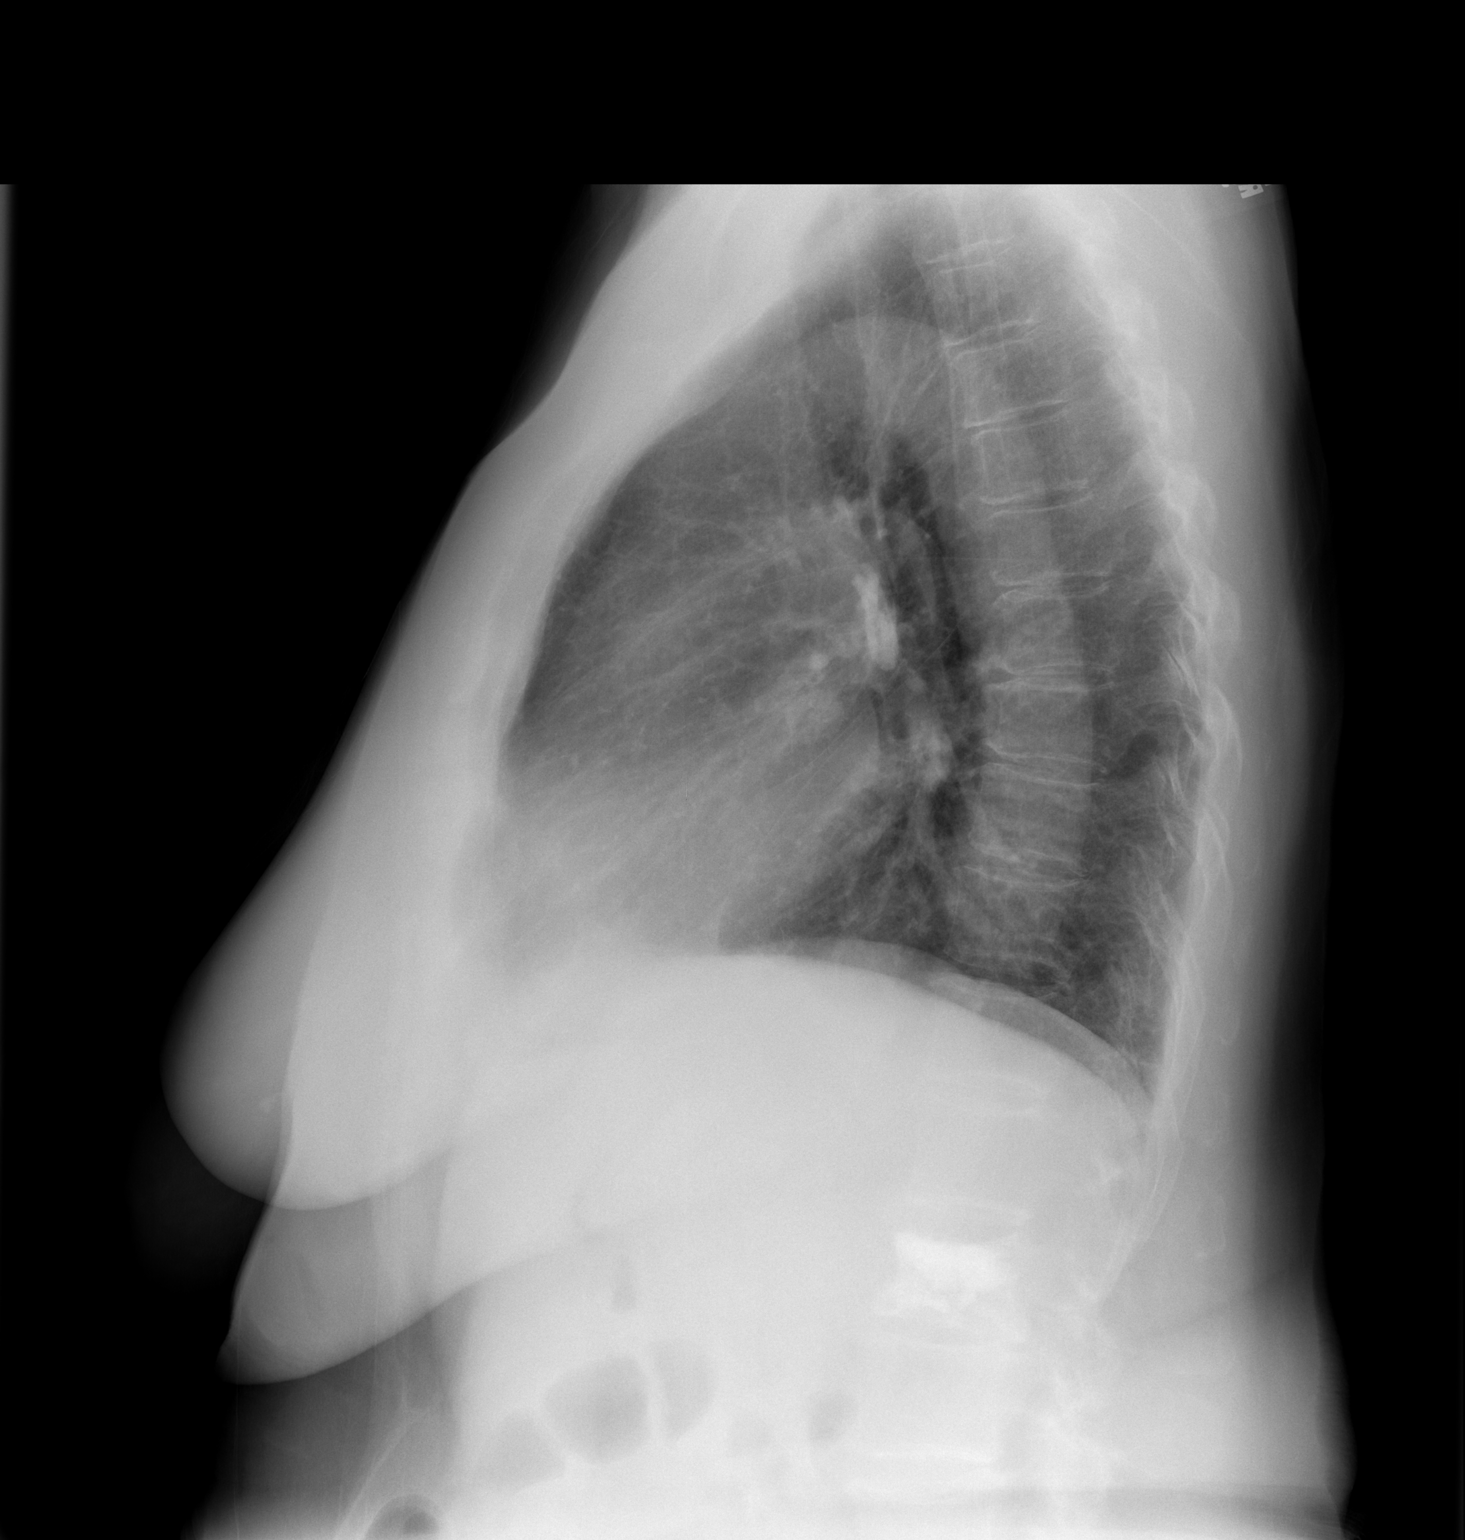

[2 of 2 positions shown; findings below may reference images not displayed]

FINDINGS: Trachea is midline.  Heart size normal. Calcified
subcarinal lymph nodes.  Moderate hiatal hernia.  Biapical pleural
parenchymal scarring.  Lungs are otherwise clear.  No pleural
fluid.  Upper lumbar vertebroplasty noted.
IMPRESSION: No acute findings.

## 2013-07-27 ENCOUNTER — Other Ambulatory Visit: Payer: Self-pay | Admitting: Internal Medicine

## 2013-07-28 ENCOUNTER — Telehealth: Payer: Self-pay | Admitting: *Deleted

## 2013-07-28 MED ORDER — ACETAMINOPHEN-CODEINE #3 300-30 MG PO TABS: 1.0000 | ORAL_TABLET | Freq: Three times a day (TID) | ORAL | Status: DC | PRN

## 2013-07-28 NOTE — Telephone Encounter (Addendum)
Done, #491

## 2013-07-28 NOTE — Telephone Encounter (Signed)
rx refill- acetaminophen-codeine (TYLENOL #3) 300-30 MG per tablet [[id number]] Last OV- 06/03/13 Last refilled- 02/17/13 #60 / 0 rf  Last UDS- 08/18/12 LOW risk

## 2013-07-28 NOTE — Addendum Note (Signed)
Addended by: Kathlene November E on: 07/28/2013 05:17 PM   Modules accepted: Orders

## 2013-07-29 ENCOUNTER — Other Ambulatory Visit: Payer: Self-pay | Admitting: Internal Medicine

## 2013-07-29 NOTE — Telephone Encounter (Signed)
Pt notified. rx rdy for pick up at front desk.  

## 2013-08-10 DIAGNOSIS — Z124 Encounter for screening for malignant neoplasm of cervix: Secondary | ICD-10-CM | POA: Diagnosis not present

## 2013-08-10 DIAGNOSIS — Z01419 Encounter for gynecological examination (general) (routine) without abnormal findings: Secondary | ICD-10-CM | POA: Diagnosis not present

## 2013-08-13 DIAGNOSIS — I8310 Varicose veins of unspecified lower extremity with inflammation: Secondary | ICD-10-CM | POA: Diagnosis not present

## 2013-08-17 ENCOUNTER — Other Ambulatory Visit (INDEPENDENT_AMBULATORY_CARE_PROVIDER_SITE_OTHER): Payer: Self-pay | Admitting: General Surgery

## 2013-08-17 DIAGNOSIS — Z9889 Other specified postprocedural states: Secondary | ICD-10-CM

## 2013-08-17 DIAGNOSIS — Z853 Personal history of malignant neoplasm of breast: Secondary | ICD-10-CM

## 2013-08-19 ENCOUNTER — Ambulatory Visit (INDEPENDENT_AMBULATORY_CARE_PROVIDER_SITE_OTHER): Payer: Medicare Other | Admitting: General Surgery

## 2013-08-20 DIAGNOSIS — I8310 Varicose veins of unspecified lower extremity with inflammation: Secondary | ICD-10-CM | POA: Diagnosis not present

## 2013-08-20 DIAGNOSIS — M79609 Pain in unspecified limb: Secondary | ICD-10-CM | POA: Diagnosis not present

## 2013-08-31 DIAGNOSIS — M545 Low back pain, unspecified: Secondary | ICD-10-CM | POA: Diagnosis not present

## 2013-08-31 DIAGNOSIS — M25559 Pain in unspecified hip: Secondary | ICD-10-CM | POA: Diagnosis not present

## 2013-09-01 ENCOUNTER — Ambulatory Visit (INDEPENDENT_AMBULATORY_CARE_PROVIDER_SITE_OTHER): Payer: Medicare Other | Admitting: Internal Medicine

## 2013-09-01 ENCOUNTER — Encounter: Payer: Self-pay | Admitting: Internal Medicine

## 2013-09-01 ENCOUNTER — Other Ambulatory Visit: Payer: Self-pay | Admitting: Orthopaedic Surgery

## 2013-09-01 VITALS — BP 141/80 | HR 88 | Temp 98.2°F | Wt 171.0 lb

## 2013-09-01 DIAGNOSIS — M545 Low back pain, unspecified: Secondary | ICD-10-CM

## 2013-09-01 DIAGNOSIS — K219 Gastro-esophageal reflux disease without esophagitis: Secondary | ICD-10-CM | POA: Diagnosis not present

## 2013-09-01 DIAGNOSIS — I10 Essential (primary) hypertension: Secondary | ICD-10-CM

## 2013-09-01 DIAGNOSIS — R609 Edema, unspecified: Secondary | ICD-10-CM | POA: Diagnosis not present

## 2013-09-01 DIAGNOSIS — M1990 Unspecified osteoarthritis, unspecified site: Secondary | ICD-10-CM

## 2013-09-01 DIAGNOSIS — M25551 Pain in right hip: Secondary | ICD-10-CM

## 2013-09-01 MED ORDER — HYDROCHLOROTHIAZIDE 25 MG PO TABS: 25.0000 mg | ORAL_TABLET | Freq: Every day | ORAL | Status: DC | PRN

## 2013-09-01 NOTE — Assessment & Plan Note (Signed)
History of DJD, lumbar fracture with chronic on and off back pain, recently she had a fall and is having anterior pelvic pain, followup by orthopedic surgery. After the fall, orthopedic surgery prescribed Percocet but she was  intolerant , ortho changed to  Hydrocodone. I  have been prescribing codeine x a while. She uses codeine or hydrocodone on-off  with relatively good control. Denies feeling excessively sedated. Check a UDS today. Continue with current strategy

## 2013-09-01 NOTE — Progress Notes (Signed)
Pre visit review using our clinic review tool, if applicable. No additional management support is needed unless otherwise documented below in the visit note. 

## 2013-09-01 NOTE — Assessment & Plan Note (Signed)
Well-controlled, last  BMP okay, refill hydrochlorothiazide, followup 6 months

## 2013-09-01 NOTE — Assessment & Plan Note (Signed)
Currently asymptomatic 

## 2013-09-01 NOTE — Progress Notes (Signed)
Subjective:    Patient ID: [name], female    DOB: February 21, 1930, 80 y.o.   MRN: [id number]  DOS:  09/01/2013 Type of  visit: Routine office visit Since the last time she was here, she has seen hematology; also had abdominal pain back in January, hematology rx a  CT which was negative except for a hiatal hernia. Abdominal pain resolved. She takes pain medication, symptoms relatively well controlled.  ROS Denies chest pain, shortness or breath at baseline. No  nausea, vomiting, diarrhea or blood in the stools. Denies any difficulty swallowing or heartburn  Past Medical History:   Headache - migraines, codeine prn   Hypertension   Hyperlipidemia   Osteoporosis   Oncology Breast cancer 10-2011 Polycythemia HEART, CV:  --Mild nonobstructive disease on cath in 2003.   --Probable Takotsubo cardiomyopathy: Severe chest pain with normal cath in 1994. Severe chest pain in 2003 with widespread T wave inversions on ECG. Cath with minimal coronary disease but LV-gram showed periapical severe hypokinesis and basilar hyperkinesia (EF 40%). Last echo in 4/09 confirmed full LV functional recovery with EF 60%, no regional wall motion abnormalities, mild to moderate LVH.   H/O CVA   ----------------------------------------------   GI:  GERD Hiatal hernia   Hemorrhoids   Hyperplastic colon polyp, 06/2001   Functional LLQ pain   h/o diverticuli   -----------------------------------------------   Obstructive sleep apnea   Varicose veins   h/o R leg DVT after a venous ablation   OA with bilateral knee pain   4-09 L2 fracture status post vertebroplasty of L2 September 10, 2007 performed by IR   Depression/anxiety -lost husband    Past Surgical History:   Appendectomy   left ovarian cyst   3/07 - stress test neg   Tonsillectomy   Back Surgery   Tubal Ligation   Dental extraction L maxillary molar   Kyphoplasty 08/2007   left total knee replacement 06/2010   Cholecystectomy, 03-2011   L  lumpectomy 11-2011  Social History: Married, lost husband 07-29-07.  Lives in [place]. lives in a town house, her daughter- [place] live w/ her  driving   Former Smoker: quit 1990 Alcohol use-yes socially   children x 3 ( 2 in [place])       Medication List       This list is accurate as of: 09/01/13 10:19 PM.  Always use your most recent med list.               acetaminophen-codeine 300-30 MG per tablet  Commonly known as:  TYLENOL #3  Take 1 tablet by mouth every 8 (eight) hours as needed. For migraine     aspirin 81 MG tablet  Take 162 mg by mouth daily.     hydrochlorothiazide 25 MG tablet  Commonly known as:  HYDRODIURIL  Take 1 tablet (25 mg total) by mouth daily as needed (swelling in the legs ).     HYDROcodone-acetaminophen 5-325 MG per tablet  Commonly known as:  NORCO/VICODIN  Take by mouth every 6 (six) hours as needed for moderate pain. rx per ortho , Dr Rhona Raider     hydroxyurea 500 MG capsule  Commonly known as:  HYDREA  Take 1 capsule (500 mg total) by mouth every other day. May take with food to minimize GI side effects.     letrozole 2.5 MG tablet  Commonly known as:  FEMARA  Take 1 tablet (2.5 mg total) by mouth daily.     LORazepam 0.5  MG tablet  Commonly known as:  ATIVAN  TAKE 1 TABLET BY MOUTH TWICE DAILY AS NEEDED     metoprolol 50 MG tablet  Commonly known as:  LOPRESSOR  TAKE (1/2) TABLET TWICE DAILY.     omeprazole 20 MG tablet  Commonly known as:  PRILOSEC OTC  Take 40 mg by mouth daily.     PROBIOTIC DAILY PO  Take by mouth every morning.     rosuvastatin 5 MG tablet  Commonly known as:  CRESTOR  Take 5 mg by mouth every other day. Take one every other day     vitamin E 400 UNIT capsule  Take 400 Units by mouth daily.           Objective:   Physical Exam BP 141/80  Pulse 88  Temp(Src) 98.2 F (36.8 C)  Wt 171 lb (77.565 kg)  SpO2 94% General -- alert, well-developed, NAD.    Lungs -- normal respiratory effort, no  intercostal retractions, no accessory muscle use, and normal breath sounds.  Heart-- normal rate, regular rhythm, no murmur.  Psych-- Cognition and judgment appear intact. Cooperative with normal attention span and concentration. No anxious or depressed appearing.        Assessment & Plan:

## 2013-09-01 NOTE — Patient Instructions (Addendum)
Please stop by the lab for a UDS   Next visit is for a physical exam in 6 months , fasting Please make an appointment       Fall Prevention and Home Safety Falls cause injuries and can affect all age groups. It is possible to use preventive measures to significantly decrease the likelihood of falls. There are many simple measures which can make your home safer and prevent falls. OUTDOORS  Repair cracks and edges of walkways and driveways.  Remove high doorway thresholds.  Trim shrubbery on the main path into your home.  Have good outside lighting.  Clear walkways of tools, rocks, debris, and clutter.  Check that handrails are not broken and are securely fastened. Both sides of steps should have handrails.  Have leaves, snow, and ice cleared regularly.  Use sand or salt on walkways during winter months.  In the garage, clean up grease or oil spills. BATHROOM  Install night lights.  Install grab bars by the toilet and in the tub and shower.  Use non-skid mats or decals in the tub or shower.  Place a plastic non-slip stool in the shower to sit on, if needed.  Keep floors dry and clean up all water on the floor immediately.  Remove soap buildup in the tub or shower on a regular basis.  Secure bath mats with non-slip, double-sided rug tape.  Remove throw rugs and tripping hazards from the floors. BEDROOMS  Install night lights.  Make sure a bedside light is easy to reach.  Do not use oversized bedding.  Keep a telephone by your bedside.  Have a firm chair with side arms to use for getting dressed.  Remove throw rugs and tripping hazards from the floor. KITCHEN  Keep handles on pots and pans turned toward the center of the stove. Use back burners when possible.  Clean up spills quickly and allow time for drying.  Avoid walking on wet floors.  Avoid hot utensils and knives.  Position shelves so they are not too high or low.  Place commonly used objects  within easy reach.  If necessary, use a sturdy step stool with a grab bar when reaching.  Keep electrical cables out of the way.  Do not use floor polish or wax that makes floors slippery. If you must use wax, use non-skid floor wax.  Remove throw rugs and tripping hazards from the floor. STAIRWAYS  Never leave objects on stairs.  Place handrails on both sides of stairways and use them. Fix any loose handrails. Make sure handrails on both sides of the stairways are as long as the stairs.  Check carpeting to make sure it is firmly attached along stairs. Make repairs to worn or loose carpet promptly.  Avoid placing throw rugs at the top or bottom of stairways, or properly secure the rug with carpet tape to prevent slippage. Get rid of throw rugs, if possible.  Have an electrician put in a light switch at the top and bottom of the stairs. OTHER FALL PREVENTION TIPS  Wear low-heel or rubber-soled shoes that are supportive and fit well. Wear closed toe shoes.  When using a stepladder, make sure it is fully opened and both spreaders are firmly locked. Do not climb a closed stepladder.  Add color or contrast paint or tape to grab bars and handrails in your home. Place contrasting color strips on first and last steps.  Learn and use mobility aids as needed. Install an electrical emergency response system.  Turn on lights to avoid dark areas. Replace light bulbs that burn out immediately. Get light switches that glow.  Arrange furniture to create clear pathways. Keep furniture in the same place.  Firmly attach carpet with non-skid or double-sided tape.  Eliminate uneven floor surfaces.  Select a carpet pattern that does not visually hide the edge of steps.  Be aware of all pets. OTHER HOME SAFETY TIPS  Set the water temperature for 120 F (48.8 C).  Keep emergency numbers on or near the telephone.  Keep smoke detectors on every level of the home and near sleeping  areas. Document Released: 04/20/2002 Document Revised: 10/30/2011 Document Reviewed: 07/20/2011 [facility] Patient Information 2014 [place].

## 2013-09-02 ENCOUNTER — Telehealth: Payer: Self-pay | Admitting: Internal Medicine

## 2013-09-02 DIAGNOSIS — Z79899 Other long term (current) drug therapy: Secondary | ICD-10-CM | POA: Diagnosis not present

## 2013-09-02 NOTE — Telephone Encounter (Signed)
Relevant patient education mailed to patient.  

## 2013-09-06 ENCOUNTER — Ambulatory Visit
Admission: RE | Admit: 2013-09-06 | Discharge: 2013-09-06 | Disposition: A | Discharge status: Home / Self Care | Payer: Medicare Other | Source: Ambulatory Visit | Attending: Orthopaedic Surgery | Admitting: Orthopaedic Surgery

## 2013-09-06 DIAGNOSIS — S322XXA Fracture of coccyx, initial encounter for closed fracture: Secondary | ICD-10-CM | POA: Diagnosis not present

## 2013-09-06 DIAGNOSIS — M545 Low back pain, unspecified: Secondary | ICD-10-CM

## 2013-09-06 DIAGNOSIS — M25551 Pain in right hip: Secondary | ICD-10-CM

## 2013-09-06 DIAGNOSIS — S32009A Unspecified fracture of unspecified lumbar vertebra, initial encounter for closed fracture: Secondary | ICD-10-CM | POA: Diagnosis not present

## 2013-09-06 DIAGNOSIS — S32509A Unspecified fracture of unspecified pubis, initial encounter for closed fracture: Secondary | ICD-10-CM | POA: Diagnosis not present

## 2013-09-06 DIAGNOSIS — S3210XA Unspecified fracture of sacrum, initial encounter for closed fracture: Secondary | ICD-10-CM | POA: Diagnosis not present

## 2013-09-10 IMAGING — CR DG CHEST 2V
2 series · 2 of 2 positions shown · non-contrast
Comparison: 01/08/2011

CLINICAL DATA: Chest pain.  High blood pressure.

CHEST - 2 VIEW

[w chest lat]
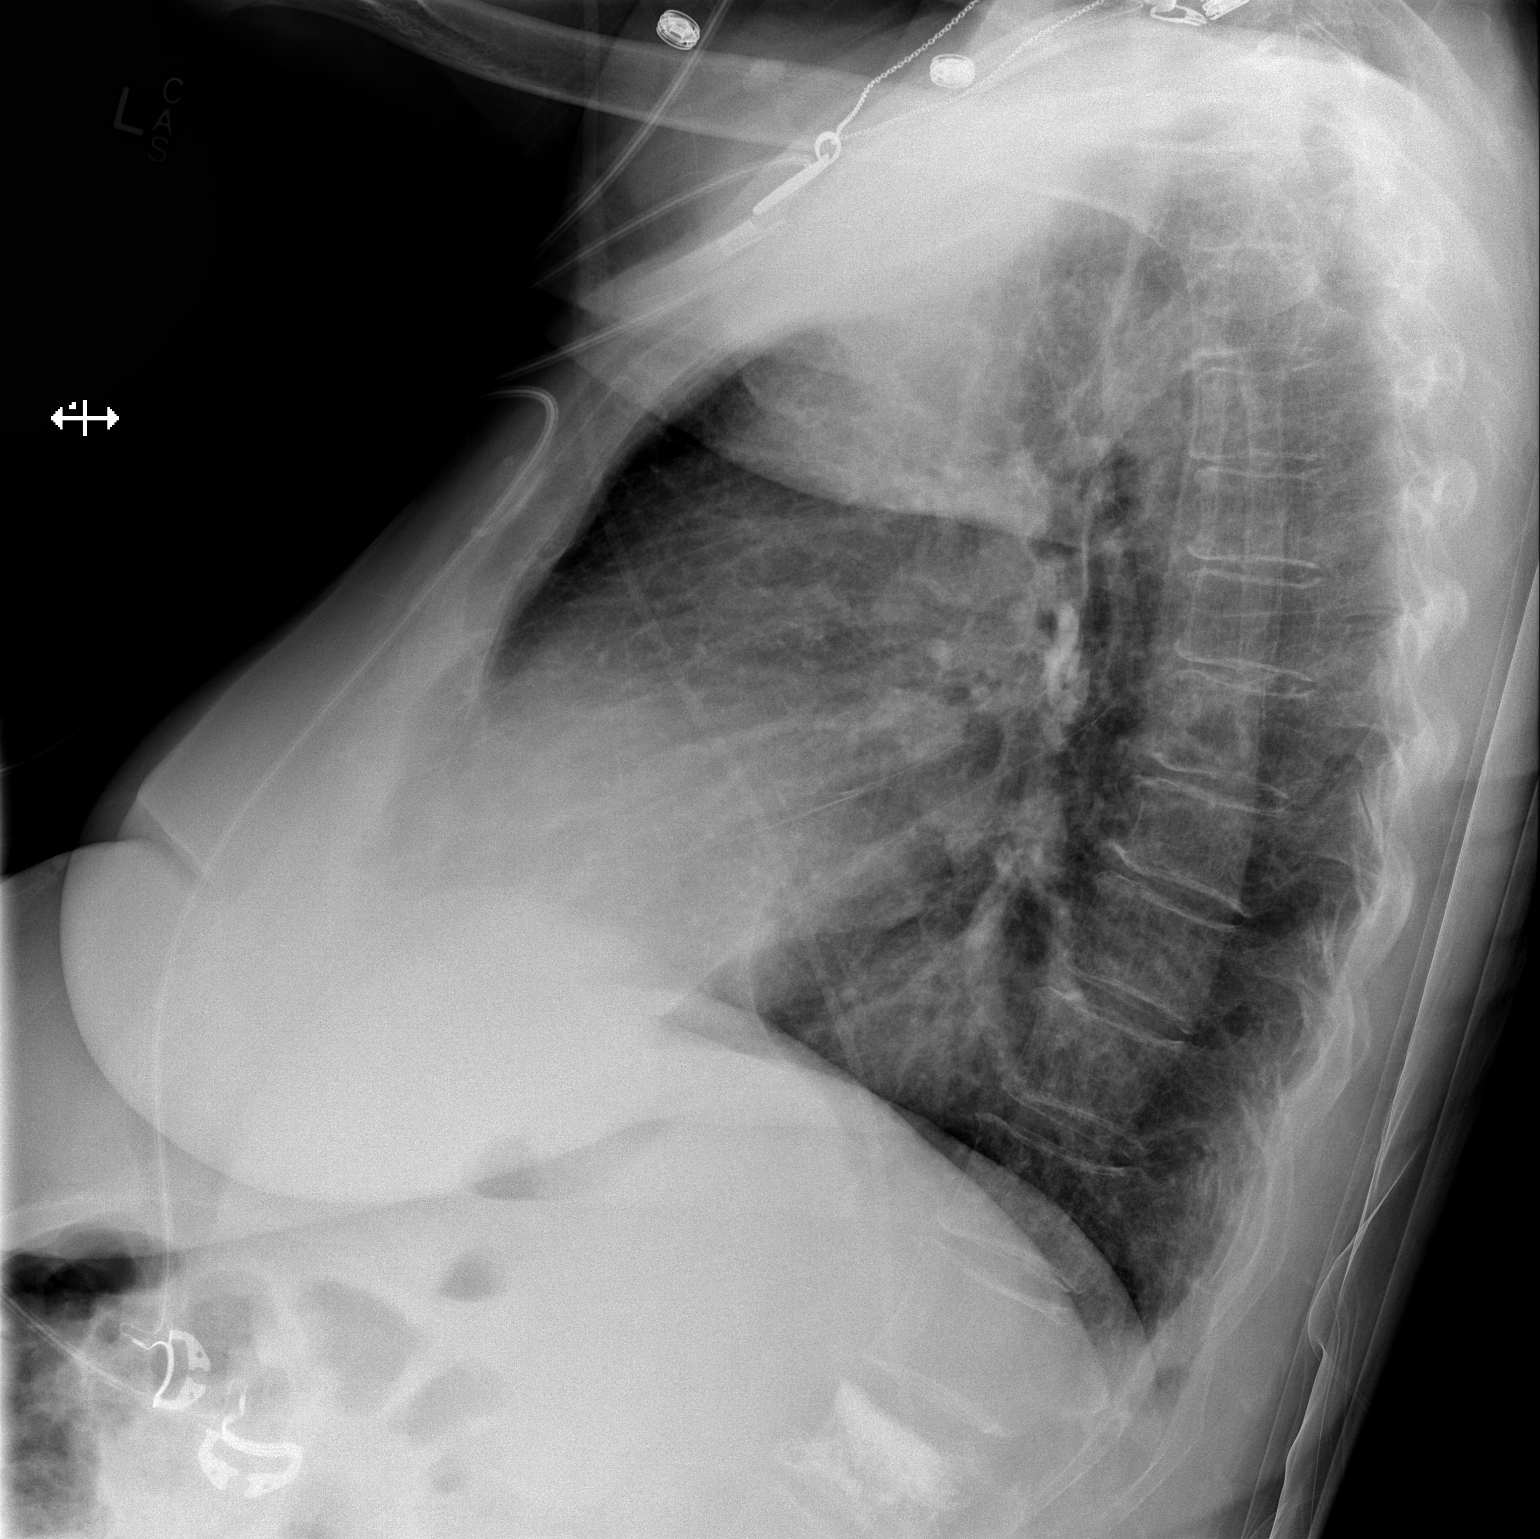

[x chest ap]
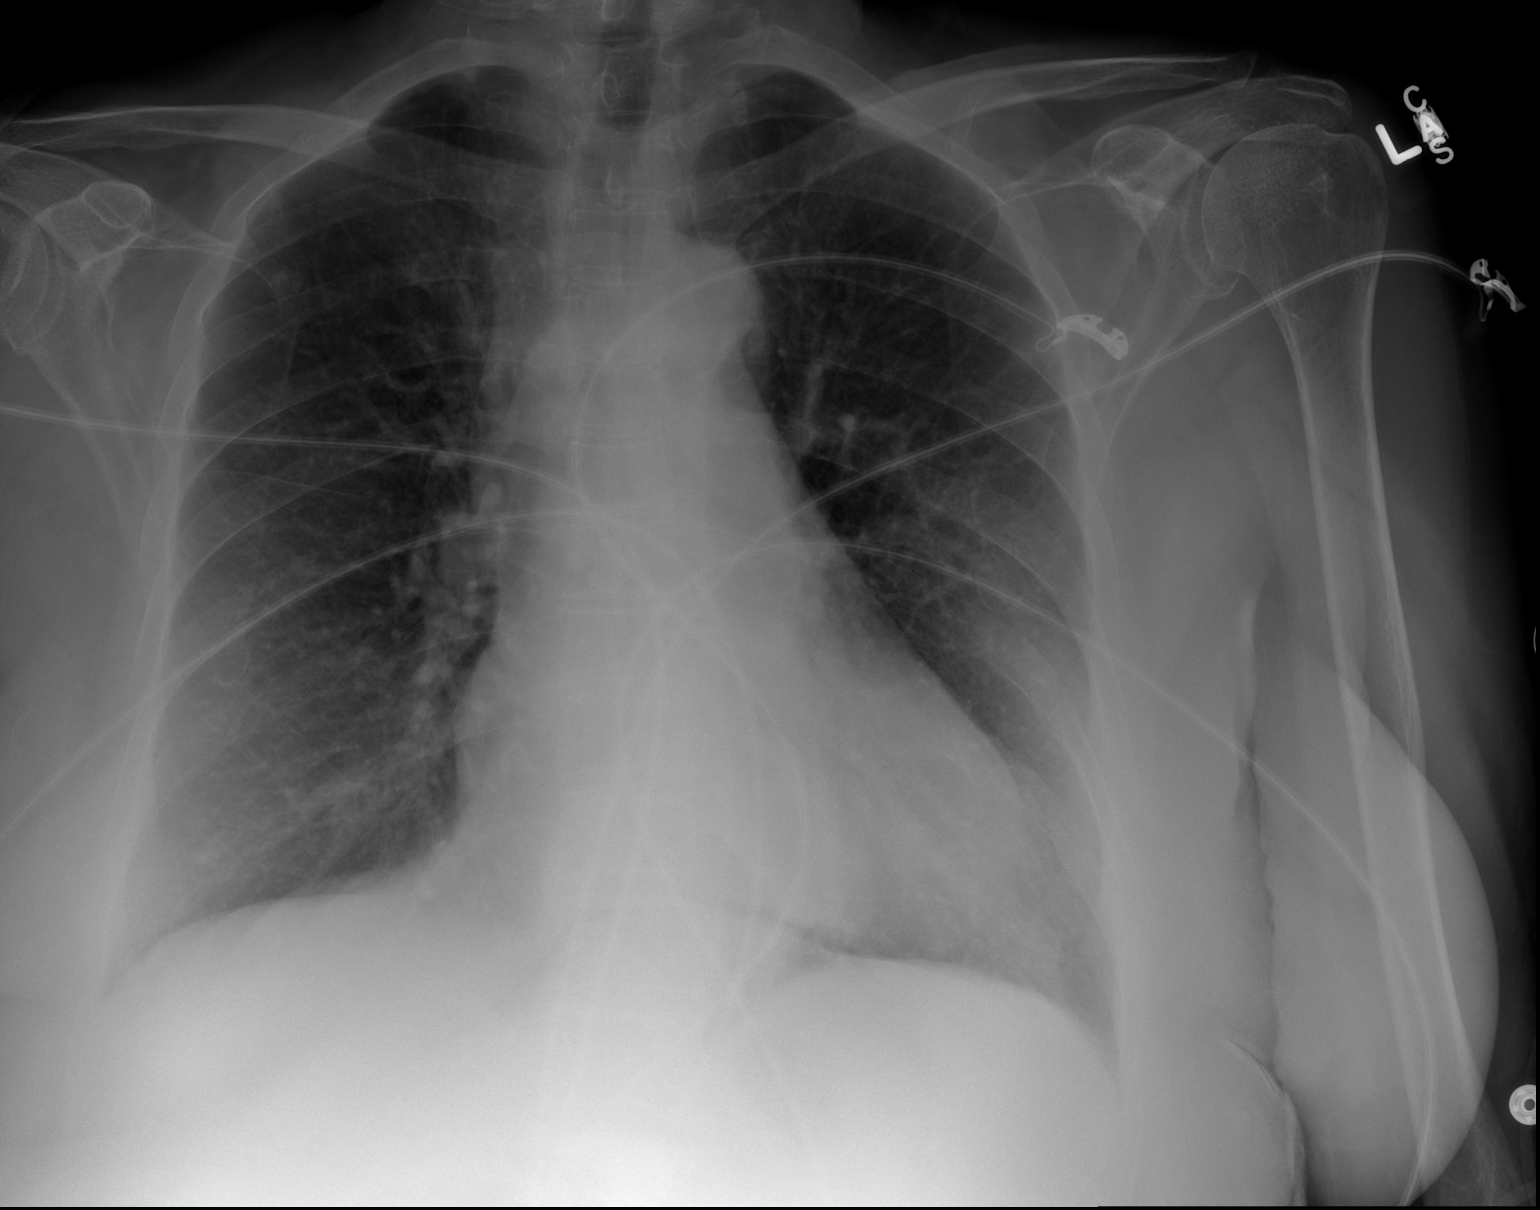

[2 of 2 positions shown; findings below may reference images not displayed]

FINDINGS: Borderline heart size with normal pulmonary vascularity.
Nodular opacity in the right upper lung likely to represent a
calcified granuloma.  This was not visible on the previous study.
No focal airspace consolidation in the lungs.  No blunting of
costophrenic angles.  No pneumothorax.  Old left rib fractures.
Degenerative changes in the thoracic spine.  Sclerosis and
compression of the upper lumbar vertebra, likely L2, stable since
09/29/2008 lumbar spine films.
IMPRESSION: No evidence of active pulmonary disease.

## 2013-09-11 DIAGNOSIS — M25559 Pain in unspecified hip: Secondary | ICD-10-CM | POA: Diagnosis not present

## 2013-09-23 ENCOUNTER — Other Ambulatory Visit: Payer: Self-pay | Admitting: Dermatology

## 2013-09-23 DIAGNOSIS — D485 Neoplasm of uncertain behavior of skin: Secondary | ICD-10-CM | POA: Diagnosis not present

## 2013-09-23 DIAGNOSIS — L821 Other seborrheic keratosis: Secondary | ICD-10-CM | POA: Diagnosis not present

## 2013-09-23 DIAGNOSIS — L819 Disorder of pigmentation, unspecified: Secondary | ICD-10-CM | POA: Diagnosis not present

## 2013-09-23 DIAGNOSIS — Z85828 Personal history of other malignant neoplasm of skin: Secondary | ICD-10-CM | POA: Diagnosis not present

## 2013-09-23 DIAGNOSIS — L259 Unspecified contact dermatitis, unspecified cause: Secondary | ICD-10-CM | POA: Diagnosis not present

## 2013-09-24 ENCOUNTER — Ambulatory Visit: Payer: Medicare Other

## 2013-09-24 ENCOUNTER — Other Ambulatory Visit (HOSPITAL_BASED_OUTPATIENT_CLINIC_OR_DEPARTMENT_OTHER): Payer: Medicare Other | Admitting: Lab

## 2013-09-24 ENCOUNTER — Ambulatory Visit (HOSPITAL_BASED_OUTPATIENT_CLINIC_OR_DEPARTMENT_OTHER): Payer: Medicare Other | Admitting: Hematology & Oncology

## 2013-09-24 VITALS — BP 126/62 | HR 65 | Temp 97.8°F | Resp 16 | Ht 67.0 in | Wt 172.4 lb

## 2013-09-24 DIAGNOSIS — C50919 Malignant neoplasm of unspecified site of unspecified female breast: Secondary | ICD-10-CM

## 2013-09-24 DIAGNOSIS — M810 Age-related osteoporosis without current pathological fracture: Secondary | ICD-10-CM | POA: Diagnosis not present

## 2013-09-24 DIAGNOSIS — D45 Polycythemia vera: Secondary | ICD-10-CM

## 2013-09-24 DIAGNOSIS — D751 Secondary polycythemia: Secondary | ICD-10-CM

## 2013-09-24 DIAGNOSIS — E559 Vitamin D deficiency, unspecified: Secondary | ICD-10-CM

## 2013-09-24 LAB — COMPREHENSIVE METABOLIC PANEL
ALT: 9 U/L (ref 0–35)
AST: 17 U/L (ref 0–37)
Albumin: 3.9 g/dL (ref 3.5–5.2)
Alkaline Phosphatase: 87 U/L (ref 39–117)
BUN: 6 mg/dL (ref 6–23)
CO2: 31 mEq/L (ref 19–32)
Calcium: 9.2 mg/dL (ref 8.4–10.5)
Chloride: 101 mEq/L (ref 96–112)
Creatinine, Ser: 0.94 mg/dL (ref 0.50–1.10)
Glucose, Bld: 94 mg/dL (ref 70–99)
Potassium: 4.6 mEq/L (ref 3.5–5.3)
Sodium: 136 mEq/L (ref 135–145)
Total Bilirubin: 1 mg/dL (ref 0.2–1.2)
Total Protein: 6.5 g/dL (ref 6.0–8.3)

## 2013-09-24 LAB — LACTATE DEHYDROGENASE: LDH: 145 U/L (ref 94–250)

## 2013-09-24 LAB — CBC WITH DIFFERENTIAL (CANCER CENTER ONLY)
BASO#: 0.1 10*3/uL (ref 0.0–0.2)
BASO%: 1 % (ref 0.0–2.0)
EOS%: 2.5 % (ref 0.0–7.0)
Eosinophils Absolute: 0.2 10*3/uL (ref 0.0–0.5)
HCT: 42.9 % (ref 34.8–46.6)
HGB: 13.1 g/dL (ref 11.6–15.9)
LYMPH#: 1.4 10*3/uL (ref 0.9–3.3)
LYMPH%: 22.3 % (ref 14.0–48.0)
MCH: 25.3 pg — ABNORMAL LOW (ref 26.0–34.0)
MCHC: 30.5 g/dL — ABNORMAL LOW (ref 32.0–36.0)
MCV: 83 fL (ref 81–101)
MONO#: 0.6 10*3/uL (ref 0.1–0.9)
MONO%: 10.2 % (ref 0.0–13.0)
NEUT#: 3.9 10*3/uL (ref 1.5–6.5)
NEUT%: 64 % (ref 39.6–80.0)
Platelets: 434 10*3/uL — ABNORMAL HIGH (ref 145–400)
RBC: 5.18 10*6/uL (ref 3.70–5.32)
RDW: 16.2 % — ABNORMAL HIGH (ref 11.1–15.7)
WBC: 6.1 10*3/uL (ref 3.9–10.0)

## 2013-09-24 LAB — IRON AND TIBC CHCC
%SAT: 7 % — ABNORMAL LOW (ref 21–57)
Iron: 29 ug/dL — ABNORMAL LOW (ref 41–142)
TIBC: 411 ug/dL (ref 236–444)
UIBC: 382 ug/dL (ref 120–384)

## 2013-09-24 LAB — FERRITIN CHCC: Ferritin: 28 ng/ml (ref 9–269)

## 2013-09-24 NOTE — Progress Notes (Signed)
Pt did not require a phlebotomy for hct > 45. dph

## 2013-09-25 NOTE — Progress Notes (Signed)
Hematology and Oncology Follow Up Visit  [name] [id number] March 13, 1930 80 y.o. 09/25/2013   Principle Diagnosis:   Polycythemia vera-JAK2 positive  Stage I (T1BN0M0 ) infiltrating duct carcinoma the left breast   Current Therapy:    1 phlebotomy to maintain hematocrit below 45%.  #2 Hydrea 500 mg by mouth every day.  #3 Femara 2.5 mg by mouth daily     Interim History:  Ms.  Parrish is back for followup. When I saw her back in January. Since enjoy she's had issues with her back and pelvis. She fell. She had pain. She has compression fractures in the lumbar spine. She has a nondisplaced fracture of the right obturator ring.  She is now on take any type of bisphosphonate therapy.  She has had no problems with headache. She's had no nausea with the Hydrea. Per she's had no bleeding. There's been no change in bowel or bladder habits.  She's had no change with her left breast.  Medications: Current outpatient prescriptions:acetaminophen-codeine (TYLENOL #3) 300-30 MG per tablet, Take 1 tablet by mouth every 8 (eight) hours as needed. For migraine, Disp: 120 tablet, Rfl: 0;  aspirin 81 MG tablet, Take 162 mg by mouth daily., Disp: , Rfl: ;  hydrochlorothiazide (HYDRODIURIL) 25 MG tablet, Take 1 tablet (25 mg total) by mouth daily as needed (swelling in the legs )., Disp: 90 tablet, Rfl: 2 HYDROcodone-acetaminophen (NORCO/VICODIN) 5-325 MG per tablet, Take by mouth every 6 (six) hours as needed for moderate pain. rx per ortho , Dr Rhona Raider, Disp: , Rfl: ;  hydroxyurea (HYDREA) 500 MG capsule, Take 1 capsule (500 mg total) by mouth every other day. May take with food to minimize GI side effects., Disp: 90 capsule, Rfl: 0;  letrozole (FEMARA) 2.5 MG tablet, Take 1 tablet (2.5 mg total) by mouth daily., Disp: 90 tablet, Rfl: 3 LORazepam (ATIVAN) 0.5 MG tablet, TAKE 1 TABLET BY MOUTH TWICE DAILY AS NEEDED, Disp: 60 tablet, Rfl: 4;  metoprolol (LOPRESSOR) 50 MG tablet, TAKE (1/2) TABLET  TWICE DAILY., Disp: 90 tablet, Rfl: 1;  omeprazole (PRILOSEC OTC) 20 MG tablet, Take 40 mg by mouth daily., Disp: , Rfl: ;  Probiotic Product (PROBIOTIC DAILY PO), Take by mouth every morning., Disp: , Rfl: ;  vitamin E 400 UNIT capsule, Take 400 Units by mouth daily. , Disp: , Rfl:  rosuvastatin (CRESTOR) 5 MG tablet, Take 5 mg by mouth every other day. Take one every other day, Disp: , Rfl:   Allergies:  Allergies  Allergen Reactions  . Atorvastatin Diarrhea    REACTION: diarrhe  . Ezetimibe     REACTION: nausea  . Fluvastatin Sodium Other (See Comments)    "Can't remember"  . Magnesium Hydroxide Other (See Comments)    REACTION: triggers HAs  . Meloxicam Other (See Comments)    Pt seeing auro's / spots.    . Penicillins Other (See Comments)    Mouth blisters 03-29-11  . Pneumovax [Pneumococcal Polysaccharides]     Local reaction  . Quinapril Hcl Other (See Comments)    03-29-11 "feelings of tiredness"  . Simvastatin Diarrhea  . Topamax [Topiramate] Itching  . Valsartan Itching and Other (See Comments)    REACTION: toes itch  . Vit D-Vit E-Safflower Oil Other (See Comments)    Headaches  . Carvedilol     "teeth hurt when I took it"    Past Medical History, Surgical history, Social history, and Family History were reviewed and updated.  Review of Systems: As  above  Physical Exam:  height is 5\' 7"  (1.702 m) and weight is 172 lb 6.4 oz (78.2 kg). Her oral temperature is 97.8 F (36.6 C). Her blood pressure is 126/62 and her pulse is 65. Her respiration is 16.   Elderly white female. Lungs are clear. Cardiac exam regular in rhythm with no murmurs rubs or bruits. Abdomen soft. Has good bowel sounds. There is no fluid wave. There is no palpable liver or spleen tip. Breast exam shows right breast with no masses or edema or erythema. There is no right axillary adenopathy. Left breast is well-healed lumpectomy at the 2:00 position. Some slight firmness is noted at the lumpectomy  site. There is no distinct mass in the left breast. By exam shows no tenderness over the spine. The some tenderness over the right hip area. Extremities shows no clubbing cyanosis or edema. She has an age-related osteoarthritic changes. Skin exam no rashes.  Lab Results  Component Value Date   WBC 6.1 09/24/2013   HGB 13.1 09/24/2013   HCT 42.9 09/24/2013   MCV 83 09/24/2013   PLT 434* 09/24/2013     Chemistry      Component Value Date/Time   NA 136 09/24/2013 1357   NA 138 06/25/2013 1356   K 4.6 09/24/2013 1357   K 4.4 06/25/2013 1356   CL 101 09/24/2013 1357   CL 101 06/25/2013 1356   CO2 31 09/24/2013 1357   CO2 32 06/25/2013 1356   BUN 6 09/24/2013 1357   BUN 8 06/25/2013 1356   CREATININE 0.94 09/24/2013 1357   CREATININE 0.7 06/25/2013 1356      Component Value Date/Time   CALCIUM 9.2 09/24/2013 1357   CALCIUM 9.3 06/25/2013 1356   ALKPHOS 87 09/24/2013 1357   ALKPHOS 85* 06/25/2013 1356   AST 17 09/24/2013 1357   AST 22 06/25/2013 1356   ALT 9 09/24/2013 1357   ALT 13 06/25/2013 1356   BILITOT 1.0 09/24/2013 1357   BILITOT 1.10 06/25/2013 1356         Impression and Plan: [name] is 80 year old white female. She is both polycythemia. She also has stage I infiltrating ductal carcinoma left breast. She is on Femara for the breast cancer. She does have some osteoporosis by a bone density test done. Again she does not want to take any bisphosphonate therapy. She got on the Internet and read about all the "horror stories". I tried to convince her that these are safe. She will think about it.  Her platelet count is back up a little bit more. I think we have to go with the Hydrea every day now.  I want to see her back in another month or so so we can followup with her issues. I spent a good half hour with her. Volanda Napoleon, MD 5/15/20156:51 AM

## 2013-10-12 ENCOUNTER — Telehealth: Payer: Self-pay

## 2013-10-12 DIAGNOSIS — M25559 Pain in unspecified hip: Secondary | ICD-10-CM | POA: Diagnosis not present

## 2013-10-12 NOTE — Telephone Encounter (Signed)
Collected:  09/02/13 Pos---Tylenol #3, Norco and Ativan Low risk per Dr. Larose Kells

## 2013-10-13 ENCOUNTER — Other Ambulatory Visit: Payer: Self-pay | Admitting: Hematology & Oncology

## 2013-10-19 ENCOUNTER — Other Ambulatory Visit: Payer: Self-pay | Admitting: Internal Medicine

## 2013-10-19 ENCOUNTER — Encounter: Payer: Self-pay | Admitting: Internal Medicine

## 2013-10-19 ENCOUNTER — Telehealth: Payer: Self-pay | Admitting: *Deleted

## 2013-10-19 MED ORDER — LORAZEPAM 0.5 MG PO TABS: ORAL_TABLET | ORAL | Status: DC

## 2013-10-19 NOTE — Telephone Encounter (Signed)
Sent to Dr.PAZ for approval

## 2013-10-19 NOTE — Telephone Encounter (Signed)
Done Last visit April 2015

## 2013-10-19 NOTE — Telephone Encounter (Signed)
Lorazepam 0.5mg  Last OV- 02/19/14  Last refilled - 06/02/13 #60 / 4 rf UDS- 09/02/13 LOW risk

## 2013-10-19 NOTE — Telephone Encounter (Signed)
rx faxed- [facility] [address]

## 2013-10-22 ENCOUNTER — Ambulatory Visit
Admission: RE | Admit: 2013-10-22 | Discharge: 2013-10-22 | Disposition: A | Discharge status: Home / Self Care | Payer: Medicare Other | Source: Ambulatory Visit | Attending: General Surgery | Admitting: General Surgery

## 2013-10-22 ENCOUNTER — Encounter (INDEPENDENT_AMBULATORY_CARE_PROVIDER_SITE_OTHER): Payer: Self-pay

## 2013-10-22 DIAGNOSIS — R928 Other abnormal and inconclusive findings on diagnostic imaging of breast: Secondary | ICD-10-CM | POA: Diagnosis not present

## 2013-10-22 DIAGNOSIS — Z853 Personal history of malignant neoplasm of breast: Secondary | ICD-10-CM

## 2013-10-22 DIAGNOSIS — Z9889 Other specified postprocedural states: Secondary | ICD-10-CM

## 2013-10-23 IMAGING — RF DG CHOLANGIOGRAM OPERATIVE
1 series · 4 of 4 positions shown · non-contrast
Comparison: Abdominal ultrasound 02/25/2011.

CLINICAL DATA: Cholelithiasis.

INTRAOPERATIVE CHOLANGIOGRAM
TECHNIQUE: Cholangiographic images from the C-arm fluoroscopic
device were submitted for interpretation post-operatively.  Please
see the procedural report for the amount of contrast and the
fluoroscopy time utilized.

[Series 1: run · 4 of 66 frames shown]
[frame 10/66]
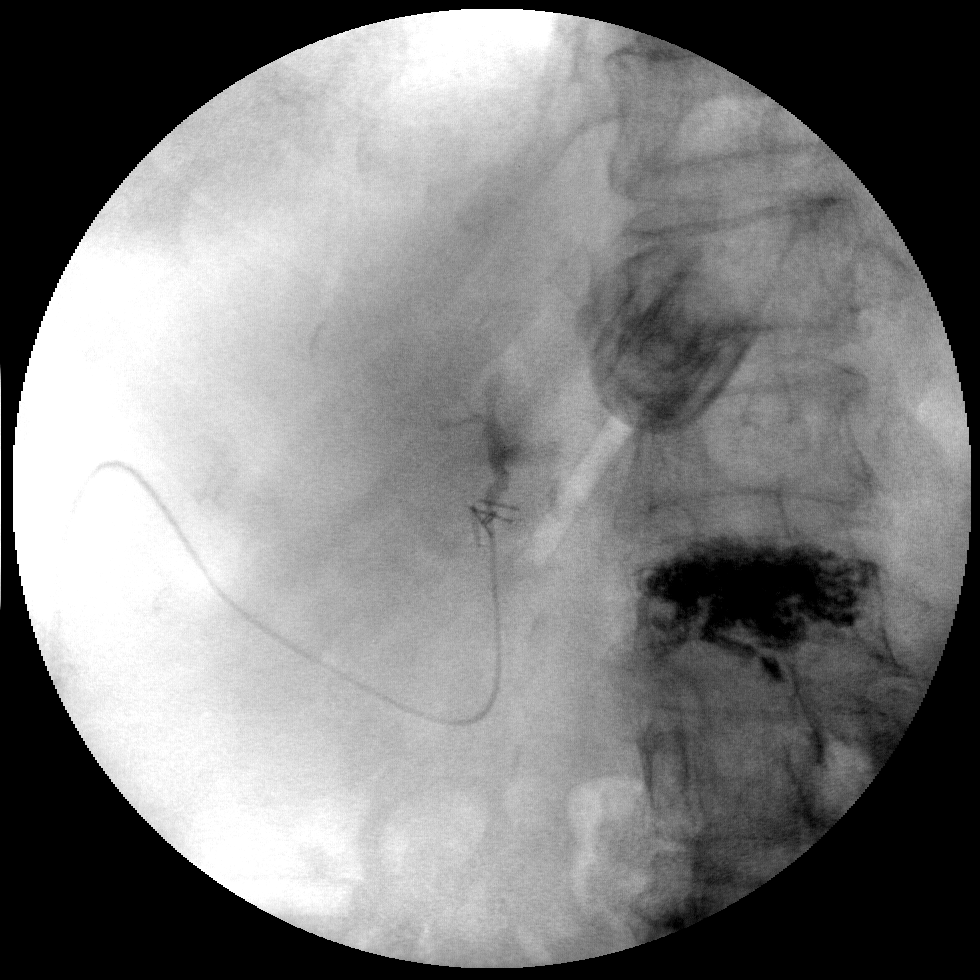
[frame 31/66]
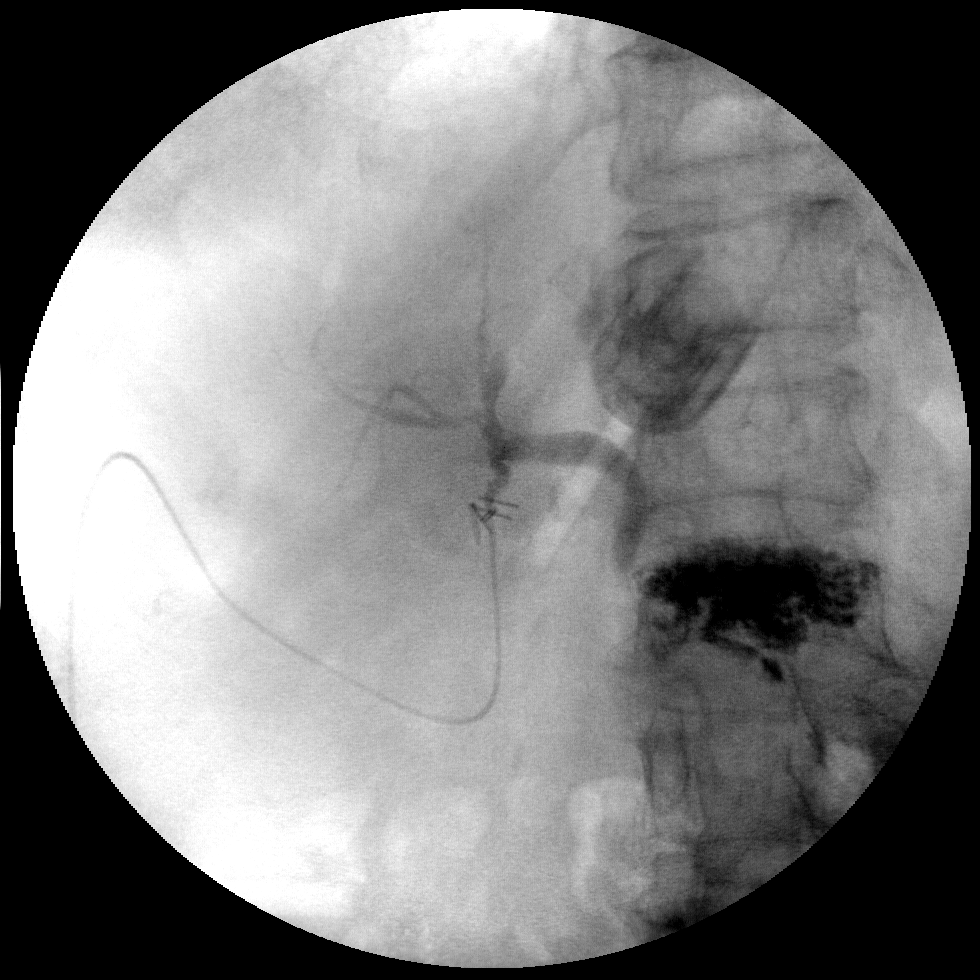
[frame 34/66]
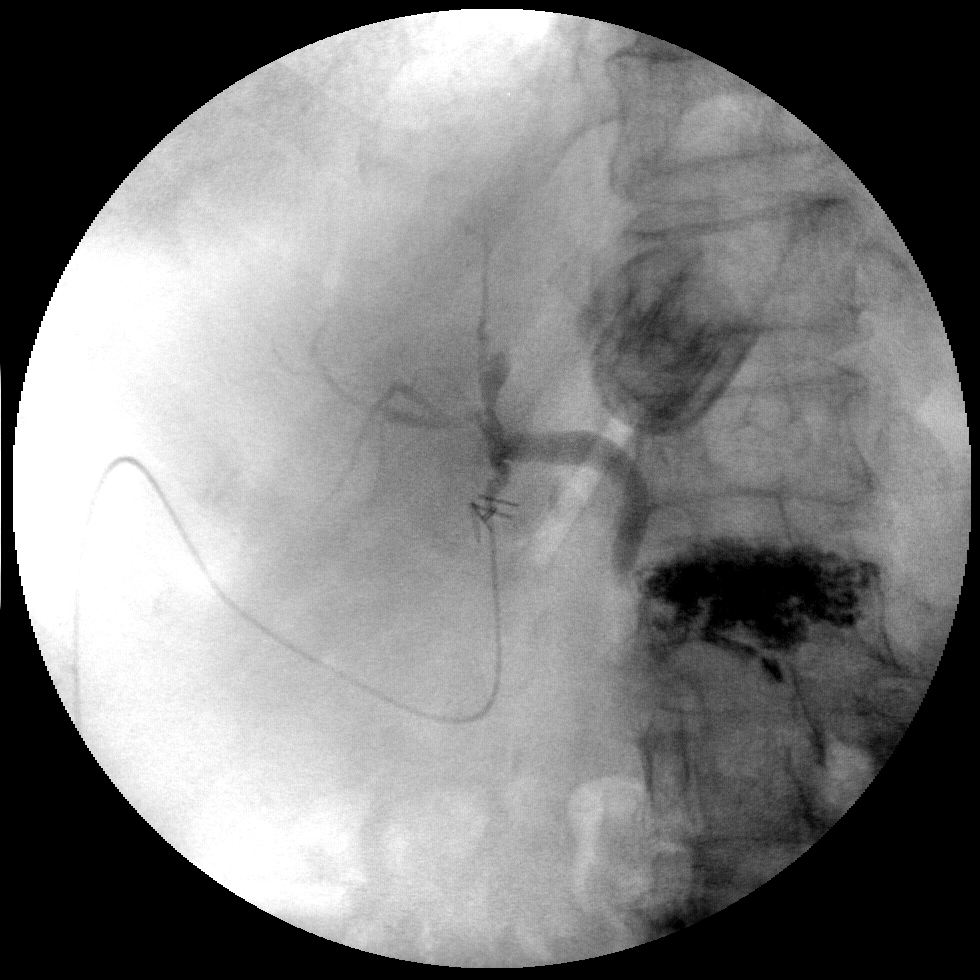
[frame 57/66]
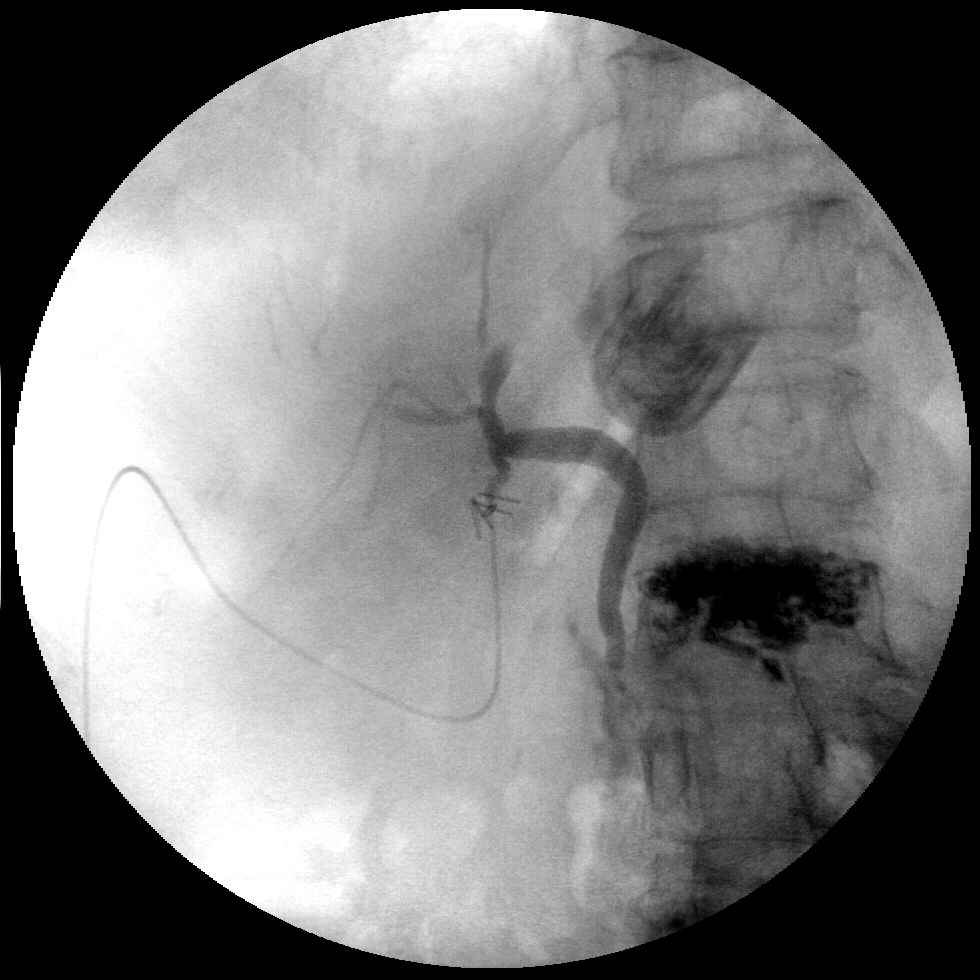

[4 of 4 positions shown; findings below may reference images not displayed]

FINDINGS: Cine fluoroscopic spot images from an intraoperative
cholangiogram demonstrate cannulation of the cystic duct.  The
common bile duct is normal in caliber.  No definite filling defects
are seen.  There is normal drainage of contrast into the duodenum.
The visualized intrahepatic ducts appear normal.  No contrast
extravasation is seen.
IMPRESSION: Normal intraoperative cholangiogram.

## 2013-11-10 ENCOUNTER — Ambulatory Visit (INDEPENDENT_AMBULATORY_CARE_PROVIDER_SITE_OTHER): Payer: Medicare Other | Admitting: General Surgery

## 2013-11-10 ENCOUNTER — Encounter (INDEPENDENT_AMBULATORY_CARE_PROVIDER_SITE_OTHER): Payer: Self-pay | Admitting: General Surgery

## 2013-11-10 VITALS — BP 126/80 | HR 77 | Temp 97.5°F | Ht 66.0 in | Wt 166.0 lb

## 2013-11-10 DIAGNOSIS — C50912 Malignant neoplasm of unspecified site of left female breast: Secondary | ICD-10-CM

## 2013-11-10 DIAGNOSIS — C50919 Malignant neoplasm of unspecified site of unspecified female breast: Secondary | ICD-10-CM | POA: Diagnosis not present

## 2013-11-10 NOTE — Progress Notes (Signed)
Subjective:     Patient ID: [name], female   DOB: 1929-06-08, 80 y.o.   MRN: [id number]  HPI This is an 80 year old female who I know from a left breast lumpectomy in 2013. This was done for a hormone receptor positive stage I left breast cancer. She is on letrozole and is tolerating that well. She had a small abscess on her left breast which she ended up taking care of herself at home. She comes back in today with no complaints referable to either breast. She is overall doing very well. She did fracture her pelvis last year for which she's finally recovered. She is seen medical oncology for polycythemia as well.  Review of Systems EXAM:  DIGITAL DIAGNOSTIC BILATERAL MAMMOGRAM WITH CAD  COMPARISON: [facility]  ACR Breast Density Category b: There are scattered areas of  fibroglandular density.  FINDINGS:  No concerning masses, calcifications or nonsurgical architectural  distortion identified within either breast.  Mammographic images were processed with CAD.  IMPRESSION:  No mammographic evidence for malignancy.  Stable left breast post lumpectomy changes.  RECOMMENDATION:  Bilateral diagnostic mammography in 1 year.  I have discussed the findings and recommendations with the patient.  Results were also provided in writing at the conclusion of the  visit. If applicable, a reminder letter will be sent to the patient  regarding the next appointment.      Objective:   Physical Exam  Vitals reviewed. Constitutional: She appears well-developed and well-nourished.  Neck: Neck supple.  Pulmonary/Chest: Right breast exhibits no inverted nipple, no mass, no nipple discharge, no skin change and no tenderness. Left breast exhibits no inverted nipple, no mass, no nipple discharge, no skin change and no tenderness.    Lymphadenopathy:    She has no cervical adenopathy.    She has no axillary adenopathy.       Right: No supraclavicular adenopathy present.       Left: No  supraclavicular adenopathy present.       Assessment:     Stage I left breast cancer     Plan:     She has no clinical evidence of recurrence. She is tolerating her Femara. She has a mammogram that is up to date. She is going to continue her own self exams. She will get her mammogram next year. It is somewhat difficult for her to followup with both Dr. Marin Olp and me so she is going to plan on following up with Dr. Marin Olp and will come back and see me as needed.

## 2013-11-10 NOTE — Patient Instructions (Signed)

## 2013-11-16 ENCOUNTER — Other Ambulatory Visit: Payer: Self-pay | Admitting: Internal Medicine

## 2013-11-27 ENCOUNTER — Encounter: Payer: Self-pay | Admitting: Hematology & Oncology

## 2013-11-27 ENCOUNTER — Other Ambulatory Visit (HOSPITAL_BASED_OUTPATIENT_CLINIC_OR_DEPARTMENT_OTHER): Payer: Medicare Other | Admitting: Lab

## 2013-11-27 ENCOUNTER — Ambulatory Visit (HOSPITAL_BASED_OUTPATIENT_CLINIC_OR_DEPARTMENT_OTHER): Payer: Medicare Other | Admitting: Hematology & Oncology

## 2013-11-27 VITALS — BP 141/73 | HR 74 | Temp 97.8°F | Resp 18 | Ht 66.0 in | Wt 170.0 lb

## 2013-11-27 DIAGNOSIS — C50911 Malignant neoplasm of unspecified site of right female breast: Secondary | ICD-10-CM

## 2013-11-27 DIAGNOSIS — D45 Polycythemia vera: Secondary | ICD-10-CM

## 2013-11-27 DIAGNOSIS — D751 Secondary polycythemia: Secondary | ICD-10-CM

## 2013-11-27 DIAGNOSIS — E559 Vitamin D deficiency, unspecified: Secondary | ICD-10-CM | POA: Diagnosis not present

## 2013-11-27 DIAGNOSIS — C50919 Malignant neoplasm of unspecified site of unspecified female breast: Secondary | ICD-10-CM

## 2013-11-27 LAB — CBC WITH DIFFERENTIAL (CANCER CENTER ONLY)
BASO#: 0.1 10*3/uL (ref 0.0–0.2)
BASO%: 1 % (ref 0.0–2.0)
EOS%: 2.5 % (ref 0.0–7.0)
Eosinophils Absolute: 0.1 10*3/uL (ref 0.0–0.5)
HCT: 42.3 % (ref 34.8–46.6)
HGB: 13.3 g/dL (ref 11.6–15.9)
LYMPH#: 1.3 10*3/uL (ref 0.9–3.3)
LYMPH%: 26.3 % (ref 14.0–48.0)
MCH: 27 pg (ref 26.0–34.0)
MCHC: 31.4 g/dL — ABNORMAL LOW (ref 32.0–36.0)
MCV: 86 fL (ref 81–101)
MONO#: 0.4 10*3/uL (ref 0.1–0.9)
MONO%: 8.4 % (ref 0.0–13.0)
NEUT#: 3 10*3/uL (ref 1.5–6.5)
NEUT%: 61.8 % (ref 39.6–80.0)
Platelets: 306 10*3/uL (ref 145–400)
RBC: 4.93 10*6/uL (ref 3.70–5.32)
RDW: 18.1 % — ABNORMAL HIGH (ref 11.1–15.7)
WBC: 4.8 10*3/uL (ref 3.9–10.0)

## 2013-11-27 LAB — COMPREHENSIVE METABOLIC PANEL
ALT: 11 U/L (ref 0–35)
AST: 16 U/L (ref 0–37)
Albumin: 3.7 g/dL (ref 3.5–5.2)
Alkaline Phosphatase: 92 U/L (ref 39–117)
BUN: 7 mg/dL (ref 6–23)
CO2: 28 mEq/L (ref 19–32)
Calcium: 9.4 mg/dL (ref 8.4–10.5)
Chloride: 102 mEq/L (ref 96–112)
Creatinine, Ser: 0.99 mg/dL (ref 0.50–1.10)
Glucose, Bld: 125 mg/dL — ABNORMAL HIGH (ref 70–99)
Potassium: 4.3 mEq/L (ref 3.5–5.3)
Sodium: 138 mEq/L (ref 135–145)
Total Bilirubin: 1.1 mg/dL (ref 0.2–1.2)
Total Protein: 6.4 g/dL (ref 6.0–8.3)

## 2013-11-27 MED ORDER — LETROZOLE 2.5 MG PO TABS: 2.5000 mg | ORAL_TABLET | Freq: Every day | ORAL | Status: DC

## 2013-11-27 NOTE — Progress Notes (Signed)
Hematology and Oncology Follow Up Visit  PECOLA HAXTON [id number] 1930-02-25 80 y.o. 11/27/2013   Principle Diagnosis:   Polycythemia vera-JAK2 positive  Stage I (T1BN0M0 ) infiltrating duct carcinoma the left breast   Current Therapy:    #1 phlebotomy to maintain hematocrit below 45%.  #2 Hydrea 500 mg by mouth every day.  #3 Femara 2.5 mg by mouth daily     Interim History:  Ms.  Kontz is back for followup. She's been doing quite well. She really has had no problems is very last saw her.  She is doing well with the Hydrea. She's not had any nausea or vomiting. She's had no cough. There's been no joint aches or pains. She's had no change in bowel or bladder habits. There's been no leg swelling.  She has had no rashes.  Her appetite is doing okay.  We are trying to get her to be iron deficient. When we last saw her in May, her ferritin was 38 with an iron saturation of 7%.  Medications: Current outpatient prescriptions:acetaminophen-codeine (TYLENOL #3) 300-30 MG per tablet, Take 1 tablet by mouth every 8 (eight) hours as needed. For migraine, Disp: 120 tablet, Rfl: 0;  aspirin 81 MG tablet, Take 162 mg by mouth daily., Disp: , Rfl: ;  hydrochlorothiazide (HYDRODIURIL) 25 MG tablet, Take 1 tablet (25 mg total) by mouth daily as needed (swelling in the legs )., Disp: 90 tablet, Rfl: 2 HYDROcodone-acetaminophen (NORCO/VICODIN) 5-325 MG per tablet, Take by mouth every 6 (six) hours as needed for moderate pain. rx per ortho , Dr Rhona Raider, Disp: , Rfl: ;  hydroxyurea (HYDREA) 500 MG capsule, TAKE 1 CAPSULE BY MOUTH EVERY OTHER DAY. MAY TAKE WITH FOOD TO MINIMIZE SIDE EFFECTS, Disp: 90 capsule, Rfl: 0;  letrozole (FEMARA) 2.5 MG tablet, Take 1 tablet (2.5 mg total) by mouth daily., Disp: 90 tablet, Rfl: 4 LORazepam (ATIVAN) 0.5 MG tablet, TAKE 1 TABLET BY MOUTH TWICE DAILY AS NEEDED, Disp: 60 tablet, Rfl: 4;  metoprolol (LOPRESSOR) 50 MG tablet, TAKE (1/2) TABLET TWICE DAILY., Disp:  90 tablet, Rfl: 1;  metoprolol (LOPRESSOR) 50 MG tablet, TAKE 1/2 TABLET BY MOUTH TWICE DAILY, Disp: 90 tablet, Rfl: 0;  omeprazole (PRILOSEC OTC) 20 MG tablet, Take 40 mg by mouth daily., Disp: , Rfl:  Probiotic Product (PROBIOTIC DAILY PO), Take by mouth every morning., Disp: , Rfl: ;  rosuvastatin (CRESTOR) 5 MG tablet, Take 5 mg by mouth every other day. Take one every other day, Disp: , Rfl: ;  vitamin E 400 UNIT capsule, Take 400 Units by mouth daily. , Disp: , Rfl:   Allergies:  Allergies  Allergen Reactions  . Atorvastatin Diarrhea    REACTION: diarrhe  . Ezetimibe     REACTION: nausea  . Fluvastatin Sodium Other (See Comments)    "Can't remember"  . Magnesium Hydroxide Other (See Comments)    REACTION: triggers HAs  . Meloxicam Other (See Comments)    Pt seeing auro's / spots.    . Pneumovax [Pneumococcal Polysaccharides]     Local reaction  . Quinapril Hcl Other (See Comments)    03-29-11 "feelings of tiredness"  . Simvastatin Diarrhea  . Topamax [Topiramate] Itching  . Valsartan Itching and Other (See Comments)    REACTION: toes itch  . Vit D-Vit E-Safflower Oil Other (See Comments)    Headaches  . Carvedilol     "teeth hurt when I took it"  . Penicillins Other (See Comments) and Rash    Other Reaction:  OTHER REACTION Mouth blisters 03-29-11    Past Medical History, Surgical history, Social history, and Family History were reviewed and updated.  Review of Systems: As above  Physical Exam:  height is 5\' 6"  (1.676 m) and weight is 170 lb (77.111 kg). Her oral temperature is 97.8 F (36.6 C). Her blood pressure is 141/73 and her pulse is 74. Her respiration is 18.   Well-developed and well-nourished white female. Head and neck exam shows no ocular or oral lesions. She has no palpable cervical or supraclavicular lymph nodes. Lungs are clear bilaterally. Cardiac exam regular rate and rhythm with a normal S1 and S2. There are no murmurs rubs or bruits. Breast exam shows  right breast with no masses or edema or erythema. There is no right axillary adenopathy. Left breast shows well-healed lumpectomy at the 2:00 position. She has no t obvious mass in the left breast. There is some slight contraction from radiation. She does not have any obvious left axillary adenopathy. Abdomen is soft. Has good bowel sounds. There is no palpable abdominal mass. There is no palpable liver or spleen tip. Neck exam shows no kyphosis. There is no tenderness over the spine ribs or hips. Extremities shows no lymphedema. She has no joint swelling. She's good range most of her joints. Skin exam no rashes. Neurological exam shows no focal neurological deficits.  Lab Results  Component Value Date   WBC 4.8 11/27/2013   HGB 13.3 11/27/2013   HCT 42.3 11/27/2013   MCV 86 11/27/2013   PLT 306 11/27/2013     Chemistry      Component Value Date/Time   NA 136 09/24/2013 1357   NA 138 06/25/2013 1356   K 4.6 09/24/2013 1357   K 4.4 06/25/2013 1356   CL 101 09/24/2013 1357   CL 101 06/25/2013 1356   CO2 31 09/24/2013 1357   CO2 32 06/25/2013 1356   BUN 6 09/24/2013 1357   BUN 8 06/25/2013 1356   CREATININE 0.94 09/24/2013 1357   CREATININE 0.7 06/25/2013 1356      Component Value Date/Time   CALCIUM 9.2 09/24/2013 1357   CALCIUM 9.3 06/25/2013 1356   ALKPHOS 87 09/24/2013 1357   ALKPHOS 85* 06/25/2013 1356   AST 17 09/24/2013 1357   AST 22 06/25/2013 1356   ALT 9 09/24/2013 1357   ALT 13 06/25/2013 1356   BILITOT 1.0 09/24/2013 1357   BILITOT 1.10 06/25/2013 1356         Impression and Plan: Ms. Obey is a 16-year-old white female with 2 separate problems. They have polycythemia is under good control. She does not be phlebotomized right now. She will stay on the Hydrea. I will not change her dose.  As far as the breast cancer is concerned, she is doing well with this. She is on Femara.  We will continue her on the Femara.  We will plan to go back in about 2 months or so.  I do not see a need  for any x-ray studies right now.   Volanda Napoleon, MD 7/17/20156:06 PM

## 2013-11-30 LAB — FERRITIN CHCC: Ferritin: 29 ng/ml (ref 9–269)

## 2013-11-30 LAB — IRON AND TIBC CHCC
%SAT: 8 % — ABNORMAL LOW (ref 21–57)
Iron: 32 ug/dL — ABNORMAL LOW (ref 41–142)
TIBC: 403 ug/dL (ref 236–444)
UIBC: 371 ug/dL (ref 120–384)

## 2014-01-29 ENCOUNTER — Ambulatory Visit (HOSPITAL_BASED_OUTPATIENT_CLINIC_OR_DEPARTMENT_OTHER): Payer: Medicare Other | Admitting: Family

## 2014-01-29 ENCOUNTER — Ambulatory Visit: Payer: Medicare Other

## 2014-01-29 ENCOUNTER — Encounter: Payer: Self-pay | Admitting: Family

## 2014-01-29 ENCOUNTER — Other Ambulatory Visit (HOSPITAL_BASED_OUTPATIENT_CLINIC_OR_DEPARTMENT_OTHER): Payer: Medicare Other | Admitting: Lab

## 2014-01-29 VITALS — BP 153/61 | HR 62 | Temp 98.1°F | Resp 14 | Ht 66.0 in | Wt 169.0 lb

## 2014-01-29 DIAGNOSIS — C50919 Malignant neoplasm of unspecified site of unspecified female breast: Secondary | ICD-10-CM | POA: Diagnosis not present

## 2014-01-29 DIAGNOSIS — D751 Secondary polycythemia: Secondary | ICD-10-CM

## 2014-01-29 DIAGNOSIS — C50911 Malignant neoplasm of unspecified site of right female breast: Secondary | ICD-10-CM

## 2014-01-29 DIAGNOSIS — D45 Polycythemia vera: Secondary | ICD-10-CM | POA: Diagnosis not present

## 2014-01-29 LAB — CBC WITH DIFFERENTIAL (CANCER CENTER ONLY)
BASO#: 0 10*3/uL (ref 0.0–0.2)
BASO%: 0.7 % (ref 0.0–2.0)
EOS%: 2.2 % (ref 0.0–7.0)
Eosinophils Absolute: 0.1 10*3/uL (ref 0.0–0.5)
HCT: 44.2 % (ref 34.8–46.6)
HGB: 14 g/dL (ref 11.6–15.9)
LYMPH#: 1.1 10*3/uL (ref 0.9–3.3)
LYMPH%: 17.6 % (ref 14.0–48.0)
MCH: 27.6 pg (ref 26.0–34.0)
MCHC: 31.7 g/dL — ABNORMAL LOW (ref 32.0–36.0)
MCV: 87 fL (ref 81–101)
MONO#: 0.6 10*3/uL (ref 0.1–0.9)
MONO%: 9.1 % (ref 0.0–13.0)
NEUT#: 4.3 10*3/uL (ref 1.5–6.5)
NEUT%: 70.4 % (ref 39.6–80.0)
Platelets: 401 10*3/uL — ABNORMAL HIGH (ref 145–400)
RBC: 5.07 10*6/uL (ref 3.70–5.32)
RDW: 14.7 % (ref 11.1–15.7)
WBC: 6 10*3/uL (ref 3.9–10.0)

## 2014-01-29 LAB — COMPREHENSIVE METABOLIC PANEL
ALT: 11 U/L (ref 0–35)
AST: 18 U/L (ref 0–37)
Albumin: 4.2 g/dL (ref 3.5–5.2)
Alkaline Phosphatase: 96 U/L (ref 39–117)
BUN: 8 mg/dL (ref 6–23)
CO2: 27 mEq/L (ref 19–32)
Calcium: 9.8 mg/dL (ref 8.4–10.5)
Chloride: 100 mEq/L (ref 96–112)
Creatinine, Ser: 0.73 mg/dL (ref 0.50–1.10)
Glucose, Bld: 96 mg/dL (ref 70–99)
Potassium: 4.5 mEq/L (ref 3.5–5.3)
Sodium: 136 mEq/L (ref 135–145)
Total Bilirubin: 1.4 mg/dL — ABNORMAL HIGH (ref 0.2–1.2)
Total Protein: 6.9 g/dL (ref 6.0–8.3)

## 2014-01-29 LAB — RETICULOCYTES (CHCC)
ABS Retic: 46.4 10*3/uL (ref 19.0–186.0)
RBC.: 5.15 MIL/uL — ABNORMAL HIGH (ref 3.87–5.11)
Retic Ct Pct: 0.9 % (ref 0.4–2.3)

## 2014-01-29 LAB — CHCC SATELLITE - SMEAR

## 2014-01-29 NOTE — Progress Notes (Signed)
No phlebotomy today d/t hct > 45. dph

## 2014-01-29 NOTE — Progress Notes (Signed)
Breast exam: No changes, no lumps, bumps, lymphedema or rash. She continues to do self breast exams and states that she up to date on her mammograms.

## 2014-01-29 NOTE — Progress Notes (Signed)
[place]  Telephone:([phone]) [phone] Fax:([phone]) [phone]  ID: Dorris Carnes OB: 06-02-1929 MR#: [id number] YIR#:[id number] Patient Care Team: Colon Branch, MD as PCP - General  DIAGNOSIS: Polycythemia vera-JAK2 positive Stage I ([id number] ) infiltrating duct carcinoma the left breast  INTERVAL HISTORY: [name] is here today for follow-up. She's been doing well. She still has some fatigue and SOB. She denies fever, chills, n/v, cough, rash, headache, dizziness, chest pain, palpitations, abdominal pain, constipation, diarrhea, blood in urine or stool. She has had no swelling, tenderness, numbness or tingling. She has had no pain or bleeding. Her weight is stable. Her appetite is good and she is drinking plenty of fluids. She is doing self breast exams but states that there have been no changes.   She is doing well with the Hydrea. In July her ferritin was 29 with an iron saturation of 8%. Overall, she is doing good.   CURRENT TREATMENT: Phlebotomy to maintain hematocrit below 45% Hydrea 500 mg by mouth every day  Femara 2.5 mg by mouth daily  REVIEW OF SYSTEMS: All other 10 point review of systems is negative.   PAST MEDICAL HISTORY: Past Medical History  Diagnosis Date  . Headache(784.0)     migraines  . Hyperlipidemia   . Osteoporosis   . CVA (cerebral infarction)   . Hemorrhoids   . Hyperplastic colon polyp 06/2001  . Diverticulitis   . Varicose vein   . DVT (deep venous thrombosis)     after venous ablation, R leg  . Fracture 09/10/07    L2, status post vertebroplasty of L2 performed by IR  . Cardiomyopathy     Probable Takotsubo, severe CP w/ normal cath in 1994. Severe CP in 2003 w/ widespread T wave inversions on ECG. Cath w/ minimal coronary disease but LV-gram showed periapical severe hypokinesis and basilar hyperkinesia (EF 40%). Last echo in 4/09 confirmed full LV functional recovery with EF 60%, no regional wall motion abnormalities, mild to moderate LVH.   . OSA (obstructive sleep apnea) 03-29-11    no cpap used, not a problem now.  Marland Kitchen CAD (coronary artery disease)     mild nonobstructive disease on cath in 2003  . Hypertension   . GERD (gastroesophageal reflux disease)   . Hiatal hernia 03-29-11    no nerve problems  . Depression with anxiety 03-29-11    lost husband 3'09  . OA (osteoarthritis) of knee 03-29-11    w/ bilateral knee pain-not a problem now  . Stroke 03-29-11    CVA x2 -last 10'12-?TIA(visual problems)  . E. coli gastroenteritis 03-29-11    8'10  . Cancer   . Polycythemia    PAST SURGICAL HISTORY: Past Surgical History  Procedure Laterality Date  . Appendectomy    . Ovarian cyst surgery      left  . Tonsillectomy    . Back surgery    . Tubal ligation    . Dental extraction      L maxillary molar  . Kyphosis surgery  08/2007    cement used  . Total knee arthroplasty  06/2010    left  . Cholecystectomy  04/09/2011    Procedure: LAPAROSCOPIC CHOLECYSTECTOMY WITH INTRAOPERATIVE CHOLANGIOGRAM;  Surgeon: Odis Hollingshead, MD;  Location: [facility];  Service: General;  Laterality: N/A;  . Breast biopsy    . Breast cyst excision    . Breast lumpectomy      left stage I left breast cancer   FAMILY HISTORY Family  History  Problem Relation Age of Onset  . Colon polyps      cousin  . Colon cancer      cousin  . Breast cancer Mother   . Breast cancer Sister   . Heart disease Father     MIs  . Leukemia Brother     GM   GYNECOLOGIC HISTORY:  No LMP recorded. Patient is postmenopausal.   SOCIAL HISTORY:  History   Social History  . Marital Status: Widowed    Spouse Name: N/A    Number of Children: N/A  . Years of Education: N/A   Occupational History  . Not on file.   Social History Main Topics  . Smoking status: Former Smoker -- 1.00 packs/day for 37 years    Types: Cigarettes    Start date: 06/26/1951    Quit date: 05/14/1988  . Smokeless tobacco: Never Used     Comment: quit 25 years ago  .  Alcohol Use: 0.6 oz/week    1 Cans of beer per week     Comment: occasional/social  . Drug Use: No  . Sexual Activity: No   Other Topics Concern  . Not on file   Social History Narrative   Lost husband 07/29/07---   Lives in [place]   Lives in a town house, her daughter moved in w/ her   Driving   Goes to the [employer] sometimes             ADVANCED DIRECTIVES: <no information>  HEALTH MAINTENANCE: History  Substance Use Topics  . Smoking status: Former Smoker -- 1.00 packs/day for 37 years    Types: Cigarettes    Start date: 06/26/1951    Quit date: 05/14/1988  . Smokeless tobacco: Never Used     Comment: quit 25 years ago  . Alcohol Use: 0.6 oz/week    1 Cans of beer per week     Comment: occasional/social   Colonoscopy: PAP: Bone density: Lipid panel:  Allergies  Allergen Reactions  . Atorvastatin Diarrhea    REACTION: diarrhe  . Ezetimibe     REACTION: nausea  . Fluvastatin Sodium Other (See Comments)    "Can't remember"  . Magnesium Hydroxide Other (See Comments)    REACTION: triggers HAs  . Meloxicam Other (See Comments)    Pt seeing auro's / spots.    . Pneumovax [Pneumococcal Polysaccharide Vaccine]     Local reaction  . Quinapril Hcl Other (See Comments)    03-29-11 "feelings of tiredness"  . Simvastatin Diarrhea  . Topamax [Topiramate] Itching  . Valsartan Itching and Other (See Comments)    REACTION: toes itch  . Vit D-Vit E-Safflower Oil Other (See Comments)    Headaches  . Carvedilol     "teeth hurt when I took it"  . Penicillins Other (See Comments) and Rash    Other Reaction: OTHER REACTION Mouth blisters 03-29-11   Current Outpatient Prescriptions  Medication Sig Dispense Refill  . acetaminophen-codeine (TYLENOL #3) 300-30 MG per tablet Take 1 tablet by mouth every 8 (eight) hours as needed. For migraine  120 tablet  0  . aspirin 81 MG tablet Take 162 mg by mouth daily.      . Cholecalciferol (VITAMIN D) 2000 UNITS tablet Take 2,000  Units by mouth daily.      . hydrochlorothiazide (HYDRODIURIL) 25 MG tablet Take 1 tablet (25 mg total) by mouth daily as needed (swelling in the legs ).  90 tablet  2  . HYDROcodone-acetaminophen (NORCO/VICODIN)  5-325 MG per tablet Take by mouth every 6 (six) hours as needed for moderate pain. rx per ortho , Dr Rhona Raider      . hydroxyurea (HYDREA) 500 MG capsule TAKE 1 CAPSULE BY MOUTH EVERY OTHER DAY. MAY TAKE WITH FOOD TO MINIMIZE SIDE EFFECTS  90 capsule  0  . letrozole (FEMARA) 2.5 MG tablet Take 1 tablet (2.5 mg total) by mouth daily.  90 tablet  4  . LORazepam (ATIVAN) 0.5 MG tablet TAKE 1 TABLET BY MOUTH TWICE DAILY AS NEEDED  60 tablet  4  . omeprazole (PRILOSEC OTC) 20 MG tablet Take 20 mg by mouth daily.       . Probiotic Product (PROBIOTIC DAILY PO) Take by mouth every morning.      . vitamin E 400 UNIT capsule Take 400 Units by mouth daily.        No current facility-administered medications for this visit.   OBJECTIVE: Filed Vitals:   01/29/14 1400  BP: 153/61  Pulse: 62  Temp: 98.1 F (36.7 C)  Resp: 14   Body mass index is 27.29 kg/(m^2). ECOG FS:0 - Asymptomatic Ocular: Sclerae unicteric, pupils equal, round and reactive to light Ear-nose-throat: Oropharynx clear, dentition fair Lymphatic: No cervical or supraclavicular adenopathy Lungs no rales or rhonchi, good excursion bilaterally Heart regular rate and rhythm, no murmur appreciated Abd soft, nontender, positive bowel sounds MSK no focal spinal tenderness, no joint edema Neuro: non-focal, well-oriented, appropriate affect Breasts: Deferred  LAB RESULTS: CMP     Component Value Date/Time   NA 138 11/27/2013 1319   NA 138 06/25/2013 1356   K 4.3 11/27/2013 1319   K 4.4 06/25/2013 1356   CL 102 11/27/2013 1319   CL 101 06/25/2013 1356   CO2 28 11/27/2013 1319   CO2 32 06/25/2013 1356   GLUCOSE 125* 11/27/2013 1319   GLUCOSE 123* 06/25/2013 1356   BUN 7 11/27/2013 1319   BUN 8 06/25/2013 1356   CREATININE 0.99  11/27/2013 1319   CREATININE 0.7 06/25/2013 1356   CALCIUM 9.4 11/27/2013 1319   CALCIUM 9.3 06/25/2013 1356   PROT 6.4 11/27/2013 1319   PROT 6.8 06/25/2013 1356   ALBUMIN 3.7 11/27/2013 1319   AST 16 11/27/2013 1319   AST 22 06/25/2013 1356   ALT 11 11/27/2013 1319   ALT 13 06/25/2013 1356   ALKPHOS 92 11/27/2013 1319   ALKPHOS 85* 06/25/2013 1356   BILITOT 1.1 11/27/2013 1319   BILITOT 1.10 06/25/2013 1356   GFRNONAA 68* 01/12/2012 1730   GFRAA 79* 01/12/2012 1730   No results found for this basename: SPEP, UPEP,  kappa and lambda light chains   Lab Results  Component Value Date   WBC 6.0 01/29/2014   NEUTROABS 4.3 01/29/2014   HGB 14.0 01/29/2014   HCT 44.2 01/29/2014   MCV 87 01/29/2014   PLT 401* 01/29/2014   No results found for this basename: LABCA2   No components found with this basename: LABCA125   No results found for this basename: INR,  in the last 168 hours  STUDIES: No results found.  ASSESSMENT/PLAN: Ms. Mcelhinney is a 80-year-old white female with polycythemia and breast cancer.  She is on Hydrea for her polycythemia and is doing well with this. We will continue with the same dose. She does not need phlebotomy at this time. Her Hct is 44.2. Her breast cancer is doing fine. She is on Femara for this. We will continue with this same therapy. We will see her  back in 2 months for labs and follow-up.  She knows to call here with any questions or concerns and to go to the ED in the event of an emeregency. We can certainly see her sooner if need be.  Eliezer Bottom, NP 01/29/2014 2:44 PM

## 2014-02-01 LAB — IRON AND TIBC CHCC
%SAT: 7 % — ABNORMAL LOW (ref 21–57)
Iron: 31 ug/dL — ABNORMAL LOW (ref 41–142)
TIBC: 429 ug/dL (ref 236–444)
UIBC: 398 ug/dL — ABNORMAL HIGH (ref 120–384)

## 2014-02-01 LAB — FERRITIN CHCC: Ferritin: 31 ng/ml (ref 9–269)

## 2014-02-19 ENCOUNTER — Encounter: Payer: Medicare Other | Admitting: Internal Medicine

## 2014-02-22 ENCOUNTER — Other Ambulatory Visit: Payer: Self-pay | Admitting: Internal Medicine

## 2014-02-24 ENCOUNTER — Other Ambulatory Visit: Payer: Self-pay | Admitting: Hematology & Oncology

## 2014-03-03 ENCOUNTER — Telehealth: Payer: Self-pay | Admitting: Internal Medicine

## 2014-03-03 ENCOUNTER — Other Ambulatory Visit: Payer: Self-pay | Admitting: Internal Medicine

## 2014-03-03 MED ORDER — METOPROLOL TARTRATE 50 MG PO TABS: ORAL_TABLET | ORAL | Status: DC

## 2014-03-03 NOTE — Telephone Encounter (Signed)
Caller name: N[place] Relation to pt: self Call back number: [phone] Pharmacy: [facility] on [address]   Reason for call:   Patient is requesting a refill of metoprolol to last her until her cpe on 03/19/14

## 2014-03-03 NOTE — Telephone Encounter (Signed)
Metoprolol refilled to [facility] until appt with Dr. Larose Kells in November.

## 2014-03-03 NOTE — Telephone Encounter (Signed)
BP at that time was okay, I don't know why was that medication discontinue. Okay to restart metoprolol at the same dose, call 1 month and 1 RF

## 2014-03-03 NOTE — Telephone Encounter (Signed)
Pt has CPE appt 03/19/2014, Pt is requesting refill on metoprolol, Pt was taken off of metoprolol on 01/29/2014 from oncology. Should Pt be taking metoprolol? Please advise.

## 2014-03-19 ENCOUNTER — Ambulatory Visit (INDEPENDENT_AMBULATORY_CARE_PROVIDER_SITE_OTHER): Payer: Medicare Other | Admitting: Internal Medicine

## 2014-03-19 ENCOUNTER — Encounter: Payer: Self-pay | Admitting: Internal Medicine

## 2014-03-19 ENCOUNTER — Ambulatory Visit (INDEPENDENT_AMBULATORY_CARE_PROVIDER_SITE_OTHER): Payer: Medicare Other

## 2014-03-19 VITALS — BP 165/88 | HR 74 | Temp 98.4°F | Ht 65.0 in | Wt 166.5 lb

## 2014-03-19 DIAGNOSIS — Z23 Encounter for immunization: Secondary | ICD-10-CM

## 2014-03-19 DIAGNOSIS — F341 Dysthymic disorder: Secondary | ICD-10-CM | POA: Diagnosis not present

## 2014-03-19 DIAGNOSIS — I10 Essential (primary) hypertension: Secondary | ICD-10-CM | POA: Diagnosis not present

## 2014-03-19 DIAGNOSIS — M150 Primary generalized (osteo)arthritis: Secondary | ICD-10-CM

## 2014-03-19 DIAGNOSIS — Z79899 Other long term (current) drug therapy: Secondary | ICD-10-CM | POA: Diagnosis not present

## 2014-03-19 DIAGNOSIS — M8949 Other hypertrophic osteoarthropathy, multiple sites: Secondary | ICD-10-CM

## 2014-03-19 DIAGNOSIS — Z0000 Encounter for general adult medical examination without abnormal findings: Secondary | ICD-10-CM

## 2014-03-19 DIAGNOSIS — I25111 Atherosclerotic heart disease of native coronary artery with angina pectoris with documented spasm: Secondary | ICD-10-CM | POA: Diagnosis not present

## 2014-03-19 DIAGNOSIS — M159 Polyosteoarthritis, unspecified: Secondary | ICD-10-CM

## 2014-03-19 DIAGNOSIS — Z79891 Long term (current) use of opiate analgesic: Secondary | ICD-10-CM | POA: Diagnosis not present

## 2014-03-19 LAB — COMPREHENSIVE METABOLIC PANEL
ALT: 10 U/L (ref 0–35)
AST: 20 U/L (ref 0–37)
Albumin: 3.4 g/dL — ABNORMAL LOW (ref 3.5–5.2)
Alkaline Phosphatase: 86 U/L (ref 39–117)
BUN: 8 mg/dL (ref 6–23)
CO2: 31 mEq/L (ref 19–32)
Calcium: 9.3 mg/dL (ref 8.4–10.5)
Chloride: 102 mEq/L (ref 96–112)
Creatinine, Ser: 0.8 mg/dL (ref 0.4–1.2)
GFR: 72.88 mL/min (ref >60.00)
Glucose, Bld: 91 mg/dL (ref 70–99)
Potassium: 3.9 mEq/L (ref 3.5–5.1)
Sodium: 137 mEq/L (ref 135–145)
Total Bilirubin: 1.5 mg/dL — ABNORMAL HIGH (ref 0.2–1.2)
Total Protein: 7 g/dL (ref 6.0–8.3)

## 2014-03-19 LAB — LIPID PANEL
Cholesterol: 212 mg/dL — ABNORMAL HIGH (ref 0–200)
HDL: 47.2 mg/dL (ref >39.00)
LDL Cholesterol: 143 mg/dL — ABNORMAL HIGH (ref 0–99)
NonHDL: 164.8
Total CHOL/HDL Ratio: 4
Triglycerides: 110 mg/dL (ref 0.0–149.0)
VLDL: 22 mg/dL (ref 0.0–40.0)

## 2014-03-19 LAB — TSH: TSH: 1.79 u[IU]/mL (ref 0.35–4.50)

## 2014-03-19 MED ORDER — METOPROLOL TARTRATE 50 MG PO TABS: 50.0000 mg | ORAL_TABLET | Freq: Two times a day (BID) | ORAL | Status: DC

## 2014-03-19 NOTE — Progress Notes (Signed)
Subjective:    Patient ID: [name], female    DOB: Oct 06, 1929, 80 y.o.   MRN: [id number]  DOS:  03/19/2014 Type of visit - description :   Here for Medicare AWV: 1. Risk factors based on Past M, S, F history: reviewed 2. Physical Activities:  [facility], takes indoor walks sometimes   3. Depression/mood:  occ feels down, no persistent sx, on lorazepam, states no further rx is needed 4. Hearing: mild impairment? No problems w/ normal conversation, not interested in further eval   5. ADL's:  Still drives, completely independent   6. Fall Risk: had a  fall within last year, no major injury, see instructions; declined PT referral, rec a cane 7. home Safety: does feel safe at home   8. Height, weight, &visual acuity: see VS, saw the eye doctor 05-2012 9. Counseling: provided 10. Labs ordered based on risk factors: if needed   11. Referral Coordination: if needed 12.  Care Plan, see assessment and plan   13.  Cognitive Assessment: motor and cognition appropriate for age   In addition, today we discussed the following: Anxiety, on Ativan as needed, symptoms well-controlled Polycythemia, good compliance with medication. Breast cancer, on Femara Hematology oncology note reviewed, she seems to be stable. Hypertension, good medication compliance, BP today moderately elevated. Right great toenail is dystrophic, hurting. Request advice.    Review of systems No nausea, vomiting, blood in the stools. Has on and off diarrhea which is a ongoing problem for years. Occasionally left lower quadrant abdominal pain which is chronic. When asked, admits to occasional chest pain, not a new issue, not getting more frequent or severe. She already got an appointment to see a cardiologist next week. Denies lower extremity edema. She did have some fatigue 2 weeks ago but that is resolved. She is able to do water exercises without chest pain.        Past Medical History    Diagnosis Date  . Headache(784.0)     migraines  . Hyperlipidemia   . Osteoporosis   . CVA (cerebral infarction)   . Hemorrhoids   . Hyperplastic colon polyp 06/2001  . Diverticulitis   . Varicose vein   . DVT (deep venous thrombosis)     after venous ablation, R leg  . Fracture 09/10/07    L2, status post vertebroplasty of L2 performed by IR  . Cardiomyopathy     Probable Takotsubo, severe CP w/ normal cath in 1994. Severe CP in 2003 w/ widespread T wave inversions on ECG. Cath w/ minimal coronary disease but LV-gram showed periapical severe hypokinesis and basilar hyperkinesia (EF 40%). Last echo in 4/09 confirmed full LV functional recovery with EF 60%, no regional wall motion abnormalities, mild to moderate LVH.  . OSA (obstructive sleep apnea) 03-29-11    no cpap used, not a problem now.  Marland Kitchen CAD (coronary artery disease)     mild nonobstructive disease on cath in 2003  . Hypertension   . GERD (gastroesophageal reflux disease)   . Hiatal hernia 03-29-11    no nerve problems  . Depression with anxiety 03-29-11    lost husband 3'09  . OA (osteoarthritis) of knee 03-29-11    w/ bilateral knee pain-not a problem now  . Stroke 03-29-11    CVA x2 -last 10'12-?TIA(visual problems)  . E. coli gastroenteritis 03-29-11    8'10  . Cancer   . Polycythemia     Past Surgical History  Procedure Laterality Date  . Appendectomy    . Ovarian cyst surgery      left  . Tonsillectomy    . Back surgery    . Tubal ligation    . Dental extraction      L maxillary molar  . Kyphosis surgery  08/2007    cement used  . Total knee arthroplasty  06/2010    left  . Cholecystectomy  04/09/2011    Procedure: LAPAROSCOPIC CHOLECYSTECTOMY WITH INTRAOPERATIVE CHOLANGIOGRAM;  Surgeon: Odis Hollingshead, MD;  Location: [facility];  Service: General;  Laterality: N/A;  . Breast biopsy    . Breast cyst excision    . Breast lumpectomy      left stage I left breast cancer    History   Social History   . Marital Status: Widowed    Spouse Name: N/A    Number of Children: 3  . Years of Education: N/A   Occupational History  . n/a    Social History Main Topics  . Smoking status: Former Smoker -- 1.00 packs/day for 37 years    Types: Cigarettes    Start date: 06/26/1951    Quit date: 05/14/1988  . Smokeless tobacco: Never Used     Comment: quit 25 years ago  . Alcohol Use: 0.6 oz/week    1 Cans of beer per week     Comment: occasional/social  . Drug Use: No  . Sexual Activity: No   Other Topics Concern  . Not on file   Social History Narrative   Lost husband 07/29/07-    Lives in [place] in a town house, her daughter moved in w/ her   Drives                  Family History  Problem Relation Age of Onset  . Colon cancer Other     cousin  . Breast cancer Mother   . Breast cancer Sister   . Heart disease Father     MIs  . Leukemia Brother     GM       Medication List       This list is accurate as of: 03/19/14  6:23 PM.  Always use your most recent med list.               acetaminophen-codeine 300-30 MG per tablet  Commonly known as:  TYLENOL #3  Take 1 tablet by mouth every 8 (eight) hours as needed. For migraine     aspirin 81 MG tablet  Take 162 mg by mouth daily.     hydrochlorothiazide 25 MG tablet  Commonly known as:  HYDRODIURIL  Take 1 tablet (25 mg total) by mouth daily as needed (swelling in the legs ).     HYDROcodone-acetaminophen 5-325 MG per tablet  Commonly known as:  NORCO/VICODIN  Take by mouth every 6 (six) hours as needed for moderate pain. rx per ortho , Dr Rhona Raider     hydroxyurea 500 MG capsule  Commonly known as:  HYDREA  TAKE ONE CAPSULE BY MOUTH EVERY OTHER DAY, MAY TAKE WITH FOOD TO MINIMIZE SIDE EFFECTS     letrozole 2.5 MG tablet  Commonly known as:  FEMARA  Take 1 tablet (2.5 mg total) by mouth daily.     LORazepam 0.5 MG tablet  Commonly known as:  ATIVAN  TAKE 1 TABLET BY MOUTH TWICE DAILY AS NEEDED      metoprolol 50 MG tablet  Commonly  known as:  LOPRESSOR  Take 1 tablet (50 mg total) by mouth 2 (two) times daily.     omeprazole 20 MG tablet  Commonly known as:  PRILOSEC OTC  Take 20 mg by mouth daily.     PROBIOTIC DAILY PO  Take by mouth every morning.     Vitamin D 2000 UNITS tablet  Take 2,000 Units by mouth daily.     vitamin E 400 UNIT capsule  Take 400 Units by mouth daily.           Objective:   Physical Exam BP 165/88 mmHg  Pulse 74  Temp(Src) 98.4 F (36.9 C) (Oral)  Ht 5\' 5"  (1.651 m)  Wt 166 lb 8 oz (75.524 kg)  BMI 27.71 kg/m2  SpO2 98%  General -- alert, well-developed, NAD.  Neck --no thyromegaly , normal carotid pulse  HEENT-- Not pale.   Lungs -- normal respiratory effort, no intercostal retractions, no accessory muscle use, and normal breath sounds.  Heart-- normal rate, regular rhythm, no murmur.  Abdomen-- Not distended, good bowel sounds,soft, non-tender.  Extremities-- no pretibial edema bilaterally  Neurologic--  alert & oriented X3. Speech normal, gait appropriate for age, strength symmetric and appropriate for age.  Psych-- Cognition and judgment appear intact. Cooperative with normal attention span and concentration. No anxious or depressed appearing.       Assessment & Plan:

## 2014-03-19 NOTE — Progress Notes (Signed)
Pre visit review using our clinic review tool, if applicable. No additional management support is needed unless otherwise documented below in the visit note. 

## 2014-03-19 NOTE — Assessment & Plan Note (Signed)
Currently well controlled with Ativan. No change

## 2014-03-19 NOTE — Patient Instructions (Signed)
Get your blood work before you leave    Increase metoprolol to 1 tablet twice a day, check your blood pressure weekly, call readings in one months  See cardiology as planned, if you have more frequent or intense chest pain: Go to the ER  Please come back to the office  In 3-4  months for a routine check up , no fasting        Fall Prevention and Home Safety Falls cause injuries and can affect all age groups. It is possible to use preventive measures to significantly decrease the likelihood of falls. There are many simple measures which can make your home safer and prevent falls. OUTDOORS  Repair cracks and edges of walkways and driveways.  Remove high doorway thresholds.  Trim shrubbery on the main path into your home.  Have good outside lighting.  Clear walkways of tools, rocks, debris, and clutter.  Check that handrails are not broken and are securely fastened. Both sides of steps should have handrails.  Have leaves, snow, and ice cleared regularly.  Use sand or salt on walkways during winter months.  In the garage, clean up grease or oil spills. BATHROOM  Install night lights.  Install grab bars by the toilet and in the tub and shower.  Use non-skid mats or decals in the tub or shower.  Place a plastic non-slip stool in the shower to sit on, if needed.  Keep floors dry and clean up all water on the floor immediately.  Remove soap buildup in the tub or shower on a regular basis.  Secure bath mats with non-slip, double-sided rug tape.  Remove throw rugs and tripping hazards from the floors. BEDROOMS  Install night lights.  Make sure a bedside light is easy to reach.  Do not use oversized bedding.  Keep a telephone by your bedside.  Have a firm chair with side arms to use for getting dressed.  Remove throw rugs and tripping hazards from the floor. KITCHEN  Keep handles on pots and pans turned toward the center of the stove. Use back burners when  possible.  Clean up spills quickly and allow time for drying.  Avoid walking on wet floors.  Avoid hot utensils and knives.  Position shelves so they are not too high or low.  Place commonly used objects within easy reach.  If necessary, use a sturdy step stool with a grab bar when reaching.  Keep electrical cables out of the way.  Do not use floor polish or wax that makes floors slippery. If you must use wax, use non-skid floor wax.  Remove throw rugs and tripping hazards from the floor. STAIRWAYS  Never leave objects on stairs.  Place handrails on both sides of stairways and use them. Fix any loose handrails. Make sure handrails on both sides of the stairways are as long as the stairs.  Check carpeting to make sure it is firmly attached along stairs. Make repairs to worn or loose carpet promptly.  Avoid placing throw rugs at the top or bottom of stairways, or properly secure the rug with carpet tape to prevent slippage. Get rid of throw rugs, if possible.  Have an electrician put in a light switch at the top and bottom of the stairs. OTHER FALL PREVENTION TIPS  Wear low-heel or rubber-soled shoes that are supportive and fit well. Wear closed toe shoes.  When using a stepladder, make sure it is fully opened and both spreaders are firmly locked. Do not climb a closed  stepladder.  Add color or contrast paint or tape to grab bars and handrails in your home. Place contrasting color strips on first and last steps.  Learn and use mobility aids as needed. Install an electrical emergency response system.  Turn on lights to avoid dark areas. Replace light bulbs that burn out immediately. Get light switches that glow.  Arrange furniture to create clear pathways. Keep furniture in the same place.  Firmly attach carpet with non-skid or double-sided tape.  Eliminate uneven floor surfaces.  Select a carpet pattern that does not visually hide the edge of steps.  Be aware of all  pets. OTHER HOME SAFETY TIPS  Set the water temperature for 120 F (48.8 C).  Keep emergency numbers on or near the telephone.  Keep smoke detectors on every level of the home and near sleeping areas. Document Released: 04/20/2002 Document Revised: 10/30/2011 Document Reviewed: 07/20/2011 [facility] Patient Information 2015 [place], [place]. This information is not intended to replace advice given to you by your health care provider. Make sure you discuss any questions you have with your health care provider.    Preventive Care for Adults    Ages 3 years and over  Blood pressure check.** / Every 1 to 2 years.  Lipid and cholesterol check.** / Every 5 years beginning at age 48 years.  Lung cancer screening. / Every year if you are aged 47-80 years and have a 30-pack-year history of smoking and currently smoke or have quit within the past 15 years. Yearly screening is stopped once you have quit smoking for at least 15 years or develop a health problem that would prevent you from having lung cancer treatment.  Clinical breast exam.** / Every year after age 4 years.  BRCA-related cancer risk assessment.** / For women who have family members with a BRCA-related cancer (breast, ovarian, tubal, or peritoneal cancers).  Mammogram.** / Every year beginning at age 57 years and continuing for as long as you are in good health. Consult with your health care provider.  Pap test.** / Every 3 years starting at age 31 years through age 23 or 11 years with 3 consecutive normal Pap tests. Testing can be stopped between 65 and 70 years with 3 consecutive normal Pap tests and no abnormal Pap or HPV tests in the past 10 years.  HPV screening.** / Every 3 years from ages 58 years through ages 80 or 84 years with a history of 3 consecutive normal Pap tests. Testing can be stopped between 65 and 70 years with 3 consecutive normal Pap tests and no abnormal Pap or HPV tests in the past 10 years.  Fecal  occult blood test (FOBT) of stool. / Every year beginning at age 53 years and continuing until age 22 years. You may not need to do this test if you get a colonoscopy every 10 years.  Flexible sigmoidoscopy or colonoscopy.** / Every 5 years for a flexible sigmoidoscopy or every 10 years for a colonoscopy beginning at age 68 years and continuing until age 49 years.  Hepatitis C blood test.** / For all people born from [birth year] through 1963 and any individual with known risks for hepatitis C.  Osteoporosis screening.** / A one-time screening for women ages 85 years and over and women at risk for fractures or osteoporosis.  Skin self-exam. / Monthly.  Influenza vaccine. / Every year.  Tetanus, diphtheria, and acellular pertussis (Tdap/Td) vaccine.** / 1 dose of Td every 10 years.  Varicella vaccine.** / Consult your health  care provider.  Zoster vaccine.** / 1 dose for adults aged 66 years or older.  Pneumococcal 13-valent conjugate (PCV13) vaccine.** / Consult your health care provider.  Pneumococcal polysaccharide (PPSV23) vaccine.** / 1 dose for all adults aged 30 years and older.  Meningococcal vaccine.** / Consult your health care provider.  Hepatitis A vaccine.** / Consult your health care provider.  Hepatitis B vaccine.** / Consult your health care provider.  Haemophilus influenzae type b (Hib) vaccine.** / Consult your health care provider. ** Family history and personal history of risk and conditions may change your health care provider's recommendations. Document Released: 06/26/2001 Document Revised: 09/14/2013 Document Reviewed: 09/25/2010 [facility] Patient Information 2015 [place], [place]. This information is not intended to replace advice given to you by your health care provider. Make sure you discuss any questions you have with your health care provider.

## 2014-03-19 NOTE — Assessment & Plan Note (Addendum)
BP elevated today, increase metoprolol 50 mg from 1/2 tablet twice a day to one tablet twice a day Monitor BPs

## 2014-03-19 NOTE — Assessment & Plan Note (Addendum)
Reports a stable pattern of chest pain, able to do water aerobics without pain. We talk about possibly nitroglycerin but she declined, develop severe headaches before. EKG today-- nsr, no change from previous Has already an appointment to see cardiology next week. Recommend ER if symptoms severe

## 2014-03-19 NOTE — Assessment & Plan Note (Addendum)
Td 6-07 Pneumonia shot 2014 prevnar 03-19-14 zostavax-- previously discussed w/  hematology immunosuppression is minimal thus ok to proceed w/ zostavax but she never did   Female care  Has not seen  Gyn  In a while , no h/o abnormal PAPs-- rec no further screenings d/t age and co morbidities  Cscope 2003: polyps  Dr Fuller Plan,  repeated Cscope   07-2008 had mild diverticulosis MMG-- dx breast cancer 2013 , per oncology  Encouraged to remain active and eat healthy

## 2014-03-19 NOTE — Assessment & Plan Note (Signed)
Uses codeine or hydrocodone sparingly, symptoms well-controlled. UDS low risk 10-2013

## 2014-03-30 ENCOUNTER — Encounter: Payer: Self-pay | Admitting: Hematology & Oncology

## 2014-03-30 ENCOUNTER — Ambulatory Visit (HOSPITAL_BASED_OUTPATIENT_CLINIC_OR_DEPARTMENT_OTHER): Payer: Medicare Other

## 2014-03-30 ENCOUNTER — Ambulatory Visit (HOSPITAL_BASED_OUTPATIENT_CLINIC_OR_DEPARTMENT_OTHER): Payer: Medicare Other | Admitting: Hematology & Oncology

## 2014-03-30 ENCOUNTER — Other Ambulatory Visit (HOSPITAL_BASED_OUTPATIENT_CLINIC_OR_DEPARTMENT_OTHER): Payer: Medicare Other | Admitting: Lab

## 2014-03-30 VITALS — BP 138/59 | HR 65 | Temp 97.8°F | Resp 14 | Ht 65.0 in | Wt 166.0 lb

## 2014-03-30 VITALS — BP 128/64 | HR 72 | Temp 97.9°F | Resp 18

## 2014-03-30 DIAGNOSIS — D45 Polycythemia vera: Secondary | ICD-10-CM

## 2014-03-30 DIAGNOSIS — D751 Secondary polycythemia: Secondary | ICD-10-CM | POA: Diagnosis not present

## 2014-03-30 DIAGNOSIS — C50912 Malignant neoplasm of unspecified site of left female breast: Secondary | ICD-10-CM

## 2014-03-30 DIAGNOSIS — Z170 Estrogen receptor positive status [ER+]: Secondary | ICD-10-CM

## 2014-03-30 LAB — CBC WITH DIFFERENTIAL (CANCER CENTER ONLY)
BASO#: 0.1 10*3/uL (ref 0.0–0.2)
BASO%: 1.1 % (ref 0.0–2.0)
EOS%: 2.3 % (ref 0.0–7.0)
Eosinophils Absolute: 0.1 10*3/uL (ref 0.0–0.5)
HCT: 45.9 % (ref 34.8–46.6)
HGB: 14.2 g/dL (ref 11.6–15.9)
LYMPH#: 1.2 10*3/uL (ref 0.9–3.3)
LYMPH%: 19.1 % (ref 14.0–48.0)
MCH: 26.9 pg (ref 26.0–34.0)
MCHC: 30.9 g/dL — ABNORMAL LOW (ref 32.0–36.0)
MCV: 87 fL (ref 81–101)
MONO#: 0.6 10*3/uL (ref 0.1–0.9)
MONO%: 10.3 % (ref 0.0–13.0)
NEUT#: 4.1 10*3/uL (ref 1.5–6.5)
NEUT%: 67.2 % (ref 39.6–80.0)
Platelets: 363 10*3/uL (ref 145–400)
RBC: 5.28 10*6/uL (ref 3.70–5.32)
RDW: 14.9 % (ref 11.1–15.7)
WBC: 6.1 10*3/uL (ref 3.9–10.0)

## 2014-03-30 LAB — COMPREHENSIVE METABOLIC PANEL
ALT: 12 U/L (ref 0–35)
AST: 18 U/L (ref 0–37)
Albumin: 4.1 g/dL (ref 3.5–5.2)
Alkaline Phosphatase: 95 U/L (ref 39–117)
BUN: 8 mg/dL (ref 6–23)
CO2: 26 mEq/L (ref 19–32)
Calcium: 9.5 mg/dL (ref 8.4–10.5)
Chloride: 101 mEq/L (ref 96–112)
Creatinine, Ser: 0.89 mg/dL (ref 0.50–1.10)
Glucose, Bld: 103 mg/dL — ABNORMAL HIGH (ref 70–99)
Potassium: 4.7 mEq/L (ref 3.5–5.3)
Sodium: 137 mEq/L (ref 135–145)
Total Bilirubin: 1.3 mg/dL — ABNORMAL HIGH (ref 0.2–1.2)
Total Protein: 7 g/dL (ref 6.0–8.3)

## 2014-03-30 LAB — RETICULOCYTES (CHCC)
ABS Retic: 52.4 10*3/uL (ref 19.0–186.0)
RBC.: 5.24 MIL/uL — ABNORMAL HIGH (ref 3.87–5.11)
Retic Ct Pct: 1 % (ref 0.4–2.3)

## 2014-03-30 LAB — IRON AND TIBC CHCC
%SAT: 9 % — ABNORMAL LOW (ref 21–57)
Iron: 38 ug/dL — ABNORMAL LOW (ref 41–142)
TIBC: 419 ug/dL (ref 236–444)
UIBC: 381 ug/dL (ref 120–384)

## 2014-03-30 LAB — FERRITIN CHCC: Ferritin: 29 ng/ml (ref 9–269)

## 2014-03-30 NOTE — Progress Notes (Signed)
[name] presents today for phlebotomy per MD orders. Phlebotomy procedure started at 1145  and ended at 1247 500 grams removed. Patient observed for 30 minutes after procedure without any incident. Patient tolerated procedure well. IV needle removed intact.

## 2014-03-30 NOTE — Patient Instructions (Signed)
Therapeutic Phlebotomy Therapeutic phlebotomy is the controlled removal of blood from your body for the purpose of treating a medical condition. It is similar to donating blood. Usually, about a pint (470 mL) of blood is removed. The average adult has 9 to 12 pints (4.3 to 5.7 L) of blood. Therapeutic phlebotomy may be used to treat the following medical conditions:  Hemochromatosis. This is a condition in which there is too much iron in the blood.  Polycythemia vera. This is a condition in which there are too many red cells in the blood.  Porphyria cutanea tarda. This is a disease usually passed from one generation to the next (inherited). It is a condition in which an important part of hemoglobin is not made properly. This results in the build up of abnormal amounts of porphyrins in the body.  Sickle cell disease. This is an inherited disease. It is a condition in which the red blood cells form an abnormal crescent shape rather than a round shape. LET YOUR CAREGIVER KNOW ABOUT:  Allergies.  Medicines taken including herbs, eyedrops, over-the-counter medicines, and creams.  Use of steroids (by mouth or creams).  Previous problems with anesthetics or numbing medicine.  History of blood clots.  History of bleeding or blood problems.  Previous surgery.  Possibility of pregnancy, if this applies. RISKS AND COMPLICATIONS This is a simple and safe procedure. Problems are unlikely. However, problems can occur and may include:  Nausea or lightheadedness.  Low blood pressure.  Soreness, bleeding, swelling, or bruising at the needle insertion site.  Infection. BEFORE THE PROCEDURE  This is a procedure that can be done as an outpatient. Confirm the time that you need to arrive for your procedure. Confirm whether there is a need to fast or withhold any medications. It is helpful to wear clothing with sleeves that can be raised above the elbow. A blood sample may be done to determine the  amount of red blood cells or iron in your blood. Plan ahead of time to have someone drive you home after the procedure. PROCEDURE The entire procedure from preparation through recovery takes about 1 hour. The actual collection takes about 10 to 15 minutes.  A needle will be inserted into your vein.  Tubing and a collection bag will be attached to that needle.  Blood will flow through the needle and tubing into the collection bag.  You may be asked to open and close your hand slowly and continuously during the entire collection.  Once the specified amount of blood has been removed from your body, the collection bag and tubing will be clamped.  The needle will be removed.  Pressure will be held on the site of the needle insertion to stop the bleeding. Then a bandage will be placed over the needle insertion site. AFTER THE PROCEDURE  Your recovery will be assessed and monitored. If there are no problems, as an outpatient, you should be able to go home shortly after the procedure.  Document Released: 10/02/2010 Document Revised: 07/23/2011 Document Reviewed: 10/02/2010 [employer] Patient Information 2015 [employer]. This information is not intended to replace advice given to you by your health care provider. Make sure you discuss any questions you have with your health care provider.  

## 2014-03-31 ENCOUNTER — Other Ambulatory Visit: Payer: Self-pay | Admitting: Hematology & Oncology

## 2014-03-31 ENCOUNTER — Ambulatory Visit
Admission: RE | Admit: 2014-03-31 | Discharge: 2014-03-31 | Disposition: A | Discharge status: Home / Self Care | Payer: Medicare Other | Source: Ambulatory Visit | Attending: Hematology & Oncology | Admitting: Hematology & Oncology

## 2014-03-31 ENCOUNTER — Other Ambulatory Visit: Payer: Self-pay

## 2014-03-31 ENCOUNTER — Ambulatory Visit: Payer: Medicare Other | Admitting: Cardiovascular Disease

## 2014-03-31 DIAGNOSIS — N644 Mastodynia: Secondary | ICD-10-CM

## 2014-03-31 NOTE — Progress Notes (Signed)
Hematology and Oncology Follow Up Visit  [name] [id number] 28-Jun-1929 80 y.o. 03/31/2014   Principle Diagnosis:   Polycythemia vera-JAK2 positive  Stage I ([id number] ) infiltrating duct carcinoma the left breast   Current Therapy:    #1 phlebotomy to maintain hematocrit below 45%.  #2 Hydrea 500 mg by mouth every day.  #3 Femara 2.5 mg by mouth daily     Interim History:  Ms.  Parrish is back for followup. She's been doing quite well. Her only complaint is with some pain over the right breast.  She did have a mammogram done back in June. Everything looked okay. However, I think that we probably should get another one. This will certainly make her feel much better.  She is doing well with the Hydrea. She's not had any nausea or vomiting. She's had no cough. There's been no joint aches or pains. She's had no change in bowel or bladder habits. There's been no leg swelling.  She has had no rashes.  Her appetite is doing okay.  We are trying to get her to be iron deficient. When we last saw her in September, her ferritin was 31 with an iron saturation of 7%.  Medications: Current outpatient prescriptions: acetaminophen-codeine (TYLENOL #3) 300-30 MG per tablet, Take 1 tablet by mouth every 8 (eight) hours as needed. For migraine, Disp: 120 tablet, Rfl: 0;  aspirin 81 MG tablet, Take 162 mg by mouth daily., Disp: , Rfl: ;  Cholecalciferol (VITAMIN D) 2000 UNITS tablet, Take 2,000 Units by mouth daily., Disp: , Rfl:  hydrochlorothiazide (HYDRODIURIL) 25 MG tablet, Take 1 tablet (25 mg total) by mouth daily as needed (swelling in the legs )., Disp: 90 tablet, Rfl: 2;  hydroxyurea (HYDREA) 500 MG capsule, TAKE ONE CAPSULE BY MOUTH EVERY OTHER DAY, MAY TAKE WITH FOOD TO MINIMIZE SIDE EFFECTS, Disp: 90 capsule, Rfl: 0;  letrozole (FEMARA) 2.5 MG tablet, Take 1 tablet (2.5 mg total) by mouth daily., Disp: 90 tablet, Rfl: 4 LORazepam (ATIVAN) 0.5 MG tablet, TAKE 1 TABLET BY MOUTH TWICE  DAILY AS NEEDED, Disp: 60 tablet, Rfl: 4;  metoprolol (LOPRESSOR) 50 MG tablet, Take 1 tablet (50 mg total) by mouth 2 (two) times daily., Disp: 60 tablet, Rfl: 6;  omeprazole (PRILOSEC OTC) 20 MG tablet, Take 20 mg by mouth daily. , Disp: , Rfl: ;  Probiotic Product (PROBIOTIC DAILY PO), Take by mouth every morning., Disp: , Rfl:  vitamin E 400 UNIT capsule, Take 400 Units by mouth daily. , Disp: , Rfl:   Allergies:  Allergies  Allergen Reactions  . Atorvastatin Diarrhea    REACTION: diarrhe  . Ezetimibe     REACTION: nausea  . Fluvastatin Sodium Other (See Comments)    "Can't remember"  . Magnesium Hydroxide Other (See Comments)    REACTION: triggers HAs  . Meloxicam Other (See Comments)    Pt seeing auro's / spots.    . Pneumovax [Pneumococcal Polysaccharide Vaccine]     Local reaction  . Quinapril Hcl Other (See Comments)    03-29-11 "feelings of tiredness"  . Simvastatin Diarrhea  . Topamax [Topiramate] Itching  . Valsartan Itching and Other (See Comments)    REACTION: toes itch  . Vit D-Vit E-Safflower Oil Other (See Comments)    Headaches  . Carvedilol     "teeth hurt when I took it"  . Penicillins Other (See Comments) and Rash    Other Reaction: OTHER REACTION Mouth blisters 03-29-11    Past Medical History, Surgical  history, Social history, and Family History were reviewed and updated.  Review of Systems: As above  Physical Exam:  height is 5\' 5"  (1.651 m) and weight is 166 lb (75.297 kg). Her oral temperature is 97.8 F (36.6 C). Her blood pressure is 138/59 and her pulse is 65. Her respiration is 14.   Well-developed and well-nourished white female. Head and neck exam shows no ocular or oral lesions. She has no palpable cervical or supraclavicular lymph nodes. Lungs are clear bilaterally. Cardiac exam regular rate and rhythm with a normal S1 and S2. There are no murmurs rubs or bruits. Breast exam shows right breast with no masses or edema or erythema. There is no  right axillary adenopathy. Left breast shows well-healed lumpectomy at the 2:00 position. She has no t obvious mass in the left breast. There is some slight contraction from radiation. She does not have any obvious left axillary adenopathy. Abdomen is soft. Has good bowel sounds. There is no palpable abdominal mass. There is no palpable liver or spleen tip. Neck exam shows no kyphosis. There is no tenderness over the spine ribs or hips. Extremities shows no lymphedema. She has no joint swelling. She's good range most of her joints. Skin exam no rashes. Neurological exam shows no focal neurological deficits.  Lab Results  Component Value Date   WBC 6.1 03/30/2014   HGB 14.2 03/30/2014   HCT 45.9 03/30/2014   MCV 87 03/30/2014   PLT 363 03/30/2014     Chemistry      Component Value Date/Time   NA 137 03/30/2014 1006   NA 138 06/25/2013 1356   K 4.7 03/30/2014 1006   K 4.4 06/25/2013 1356   CL 101 03/30/2014 1006   CL 101 06/25/2013 1356   CO2 26 03/30/2014 1006   CO2 32 06/25/2013 1356   BUN 8 03/30/2014 1006   BUN 8 06/25/2013 1356   CREATININE 0.89 03/30/2014 1006   CREATININE 0.7 06/25/2013 1356      Component Value Date/Time   CALCIUM 9.5 03/30/2014 1006   CALCIUM 9.3 06/25/2013 1356   ALKPHOS 95 03/30/2014 1006   ALKPHOS 85* 06/25/2013 1356   AST 18 03/30/2014 1006   AST 22 06/25/2013 1356   ALT 12 03/30/2014 1006   ALT 13 06/25/2013 1356   BILITOT 1.3* 03/30/2014 1006   BILITOT 1.10 06/25/2013 1356         Impression and Plan: [name] is a 80 year old white female with 2 separate problems. We will go ahead and phlebotomize her. It has been a while since she's been phlebotomized. I think this will make her feel better.  As far as the breast cancer is concerned, she is doing well with this. She is on Femara.I cannot feel anything with the right breast. However, I think that we need to get another mammogram.  We will continue her on the Femara.  We will plan to  go back in about 2 months or so.     Volanda Napoleon, MD 11/18/20157:26 AM

## 2014-04-05 ENCOUNTER — Encounter: Payer: Self-pay | Admitting: Medical

## 2014-04-05 ENCOUNTER — Ambulatory Visit (HOSPITAL_BASED_OUTPATIENT_CLINIC_OR_DEPARTMENT_OTHER)
Admission: RE | Admit: 2014-04-05 | Discharge: 2014-04-05 | Disposition: A | Discharge status: Home / Self Care | Payer: Medicare Other | Source: Ambulatory Visit | Attending: Medical | Admitting: Medical

## 2014-04-05 ENCOUNTER — Ambulatory Visit (INDEPENDENT_AMBULATORY_CARE_PROVIDER_SITE_OTHER): Payer: Medicare Other | Admitting: Medical

## 2014-04-05 VITALS — BP 143/84 | HR 77 | Temp 97.9°F | Ht 65.0 in | Wt 167.8 lb

## 2014-04-05 DIAGNOSIS — I25111 Atherosclerotic heart disease of native coronary artery with angina pectoris with documented spasm: Secondary | ICD-10-CM

## 2014-04-05 DIAGNOSIS — L03116 Cellulitis of left lower limb: Secondary | ICD-10-CM

## 2014-04-05 DIAGNOSIS — I8392 Asymptomatic varicose veins of left lower extremity: Secondary | ICD-10-CM | POA: Diagnosis not present

## 2014-04-05 DIAGNOSIS — I868 Varicose veins of other specified sites: Secondary | ICD-10-CM | POA: Diagnosis not present

## 2014-04-05 DIAGNOSIS — M25562 Pain in left knee: Secondary | ICD-10-CM

## 2014-04-05 DIAGNOSIS — I809 Phlebitis and thrombophlebitis of unspecified site: Secondary | ICD-10-CM

## 2014-04-05 DIAGNOSIS — M79606 Pain in leg, unspecified: Secondary | ICD-10-CM | POA: Diagnosis present

## 2014-04-05 MED ORDER — DOXYCYCLINE HYCLATE 100 MG PO TABS: 100.0000 mg | ORAL_TABLET | Freq: Two times a day (BID) | ORAL | Status: DC

## 2014-04-05 NOTE — Progress Notes (Signed)
Subjective:    Patient ID: [name], female    DOB: 06/27/1929, 80 y.o.   MRN: [id number]  HPI  Pt in with some recent history of left calf pain. She had some cramping in her left calf 4 days ago and next day mild soreness. Then skin got red and tender around some of her varicose veins in this area.  Pt has polycythemia history. Pt does see hematoligst. 6 days  Ago had phlebotomy.  No hx of dvt. No traumas. No sob or wheezing.  Past Medical History  Diagnosis Date  . Headache(784.0)     migraines  . Hyperlipidemia   . Osteoporosis   . CVA (cerebral infarction)   . Hemorrhoids   . Hyperplastic colon polyp 06/2001  . Diverticulitis   . Varicose vein   . DVT (deep venous thrombosis)     after venous ablation, R leg  . Fracture 09/10/07    L2, status post vertebroplasty of L2 performed by IR  . Cardiomyopathy     Probable Takotsubo, severe CP w/ normal cath in 1994. Severe CP in 2003 w/ widespread T wave inversions on ECG. Cath w/ minimal coronary disease but LV-gram showed periapical severe hypokinesis and basilar hyperkinesia (EF 40%). Last echo in 4/09 confirmed full LV functional recovery with EF 60%, no regional wall motion abnormalities, mild to moderate LVH.  . OSA (obstructive sleep apnea) 03-29-11    no cpap used, not a problem now.  Marland Kitchen CAD (coronary artery disease)     mild nonobstructive disease on cath in 2003  . Hypertension   . GERD (gastroesophageal reflux disease)   . Hiatal hernia 03-29-11    no nerve problems  . Depression with anxiety 03-29-11    lost husband 3'09  . OA (osteoarthritis) of knee 03-29-11    w/ bilateral knee pain-not a problem now  . Stroke 03-29-11    CVA x2 -last 10'12-?TIA(visual problems)  . E. coli gastroenteritis 03-29-11    8'10  . Cancer   . Polycythemia     History   Social History  . Marital Status: Widowed    Spouse Name: N/A    Number of Children: 3  . Years of Education: N/A   Occupational History  . n/a     Social History Main Topics  . Smoking status: Former Smoker -- 1.00 packs/day for 37 years    Types: Cigarettes    Start date: 06/26/1951    Quit date: 05/14/1988  . Smokeless tobacco: Never Used     Comment: quit 25 years ago  . Alcohol Use: 0.6 oz/week    1 Cans of beer per week     Comment: occasional/social  . Drug Use: No  . Sexual Activity: No   Other Topics Concern  . Not on file   Social History Narrative   Lost husband 07/29/07-    Lives in [place] in a town house, her daughter moved in w/ her   Drives                 Past Surgical History  Procedure Laterality Date  . Appendectomy    . Ovarian cyst surgery      left  . Tonsillectomy    . Back surgery    . Tubal ligation    . Dental extraction      L maxillary molar  . Kyphosis surgery  08/2007    cement used  . Total knee arthroplasty  06/2010  left  . Cholecystectomy  04/09/2011    Procedure: LAPAROSCOPIC CHOLECYSTECTOMY WITH INTRAOPERATIVE CHOLANGIOGRAM;  Surgeon: Odis Hollingshead, MD;  Location: [facility];  Service: General;  Laterality: N/A;  . Breast biopsy    . Breast cyst excision    . Breast lumpectomy      left stage I left breast cancer    Family History  Problem Relation Age of Onset  . Colon cancer Other     cousin  . Breast cancer Mother   . Breast cancer Sister   . Heart disease Father     MIs  . Leukemia Brother     GM    Allergies  Allergen Reactions  . Atorvastatin Diarrhea    REACTION: diarrhe  . Ezetimibe     REACTION: nausea  . Fluvastatin Sodium Other (See Comments)    "Can't remember"  . Magnesium Hydroxide Other (See Comments)    REACTION: triggers HAs  . Meloxicam Other (See Comments)    Pt seeing auro's / spots.    . Pneumovax [Pneumococcal Polysaccharide Vaccine]     Local reaction  . Quinapril Hcl Other (See Comments)    03-29-11 "feelings of tiredness"  . Simvastatin Diarrhea  . Topamax [Topiramate] Itching  . Valsartan Itching and Other  (See Comments)    REACTION: toes itch  . Vit D-Vit E-Safflower Oil Other (See Comments)    Headaches  . Carvedilol     "teeth hurt when I took it"  . Penicillins Other (See Comments) and Rash    Other Reaction: OTHER REACTION Mouth blisters 03-29-11    Current Outpatient Prescriptions on File Prior to Visit  Medication Sig Dispense Refill  . acetaminophen-codeine (TYLENOL #3) 300-30 MG per tablet Take 1 tablet by mouth every 8 (eight) hours as needed. For migraine 120 tablet 0  . aspirin 81 MG tablet Take 162 mg by mouth daily.    . Cholecalciferol (VITAMIN D) 2000 UNITS tablet Take 2,000 Units by mouth daily.    . hydrochlorothiazide (HYDRODIURIL) 25 MG tablet Take 1 tablet (25 mg total) by mouth daily as needed (swelling in the legs ). 90 tablet 2  . hydroxyurea (HYDREA) 500 MG capsule TAKE ONE CAPSULE BY MOUTH EVERY OTHER DAY, MAY TAKE WITH FOOD TO MINIMIZE SIDE EFFECTS 90 capsule 0  . letrozole (FEMARA) 2.5 MG tablet Take 1 tablet (2.5 mg total) by mouth daily. 90 tablet 4  . LORazepam (ATIVAN) 0.5 MG tablet TAKE 1 TABLET BY MOUTH TWICE DAILY AS NEEDED 60 tablet 4  . metoprolol (LOPRESSOR) 50 MG tablet Take 1 tablet (50 mg total) by mouth 2 (two) times daily. 60 tablet 6  . omeprazole (PRILOSEC OTC) 20 MG tablet Take 20 mg by mouth daily.     . Probiotic Product (PROBIOTIC DAILY PO) Take by mouth every morning.    . vitamin E 400 UNIT capsule Take 400 Units by mouth daily.      No current facility-administered medications on file prior to visit.    BP 143/84 mmHg  Pulse 77  Temp(Src) 97.9 F (36.6 C) (Oral)  Ht 5\' 5"  (1.651 m)  Wt 167 lb 12.8 oz (76.114 kg)  BMI 27.92 kg/m2  SpO2 97%     Review of Systems  Constitutional: Negative for fever, chills and fatigue.  Respiratory: Negative for cough, chest tightness, shortness of breath and wheezing.   Cardiovascular: Negative for chest pain and palpitations.  Gastrointestinal: Negative for nausea, vomiting, abdominal pain,  diarrhea and constipation.  Musculoskeletal:  Left medial calf pain.  Skin:       Lt medial calf redness and warmth.  Neurological: Negative for dizziness, tremors, seizures, syncope, weakness, light-headedness, numbness and headaches.  Hematological: Negative for adenopathy. Does not bruise/bleed easily.       Objective:   Physical Exam   General- No acute distress. Neck- FROM, no jvd. Heart- RRR Lungs- CTA Lt Lower ext- 3 cm indurated area medial aspect of the calf. Center of this area demonstrates 2 varicose vein inflammation that are indurated and tender. These varicose vein length area about 33mm each    Calfs look symmetrical. Negative homans signs. No pitting edema.        Assessment & Plan:  Pt deferred labs for her cramping the other day.

## 2014-04-05 NOTE — Progress Notes (Signed)
Pre visit review using our clinic review tool, if applicable. No additional management support is needed unless otherwise documented below in the visit note. 

## 2014-04-05 NOTE — Patient Instructions (Addendum)
You do appear to have some phlebitis and some probable cellulitis as well.  I am going to prescribe you doxycycline.(Please make sure you eat a little before the antibiotic.)  Warm compresses twice daily to area.  Low dose ibuprofen 200 mg (3 times a day if needed)  I am going to order left lower extremity doppler today as well.  Follow up in 7 days or as needed.

## 2014-04-06 DIAGNOSIS — L0390 Cellulitis, unspecified: Secondary | ICD-10-CM | POA: Insufficient documentation

## 2014-04-06 DIAGNOSIS — I809 Phlebitis and thrombophlebitis of unspecified site: Secondary | ICD-10-CM | POA: Insufficient documentation

## 2014-04-06 NOTE — Assessment & Plan Note (Signed)
Rx doxycycline. Rx advisement explained to the pt.

## 2014-04-06 NOTE — Assessment & Plan Note (Addendum)
May have thrombophlebitis per ultrasound report. Warm compress and low dose ibuprofen. Doppler done today did not show DVT. We will follow up in 1 wk. Pt advised of her doppler results. I advised my nurse to notify pt if redness expands, if any calf swelling, fever, chills, or popliteal pain during the interim then ED or UC over the holidays.

## 2014-04-12 ENCOUNTER — Encounter: Payer: Self-pay | Admitting: *Deleted

## 2014-04-12 ENCOUNTER — Encounter: Payer: Self-pay | Admitting: Medical

## 2014-04-12 ENCOUNTER — Ambulatory Visit (INDEPENDENT_AMBULATORY_CARE_PROVIDER_SITE_OTHER): Payer: Medicare Other | Admitting: Medical

## 2014-04-12 VITALS — BP 145/82 | HR 70 | Temp 98.0°F | Ht 64.75 in | Wt 167.2 lb

## 2014-04-12 DIAGNOSIS — L03116 Cellulitis of left lower limb: Secondary | ICD-10-CM

## 2014-04-12 DIAGNOSIS — I25111 Atherosclerotic heart disease of native coronary artery with angina pectoris with documented spasm: Secondary | ICD-10-CM

## 2014-04-12 DIAGNOSIS — I809 Phlebitis and thrombophlebitis of unspecified site: Secondary | ICD-10-CM | POA: Diagnosis not present

## 2014-04-12 MED ORDER — DOXYCYCLINE HYCLATE 100 MG PO TABS: 100.0000 mg | ORAL_TABLET | Freq: Two times a day (BID) | ORAL | Status: DC

## 2014-04-12 NOTE — Patient Instructions (Addendum)
For your likely superficial phlebitis per doppler report, I have called and left message with Dr. Katheran Awe office in regards to treatment of this.(along with your  polycythemia vera). He did advise aspirin 325 mg po q day x 10 days. Then resume low dose 81 mg after 10 th day. I notified pt.  Your surrounding skin is still faint red and warm. I will give your an additional 3 days of antibiotics. Make sure you have moderate sized meal before tab. The medication  may be cause of your nausea. If this worsens or other  associated symptoms then please let us know.  Follow up in 7-10 days or as needed.

## 2014-04-12 NOTE — Assessment & Plan Note (Signed)
Pt had no dvt on doppler last week. I did leave message for  Dr. Katheran Awe with his nurse regarding the superficial phlebitis and treatment since he sees her.   He recommend she only increase her aspirin to 325 mg po q day x 10 days then resume 81 mg after 10 days. I called pt and gave her this message. Will recheck her leg again in 10 days or if needed sooner.

## 2014-04-12 NOTE — Progress Notes (Signed)
Received call from [facility] about patient with new diagnosis of superficial phlebitis with possible early cellulitis. There was question about whether Dr Marin Olp wanted to add anything to his treatment plan. I spoke with Dr Marin Olp who wanted patient to increase her ASA dose to full dose aspirin daily x 10 days then return to her usual dose of 81mg  per day. Spoke with Percell Miller and he will call patient and inform her of change.

## 2014-04-12 NOTE — Progress Notes (Signed)
Subjective:    Patient ID: [name], female    DOB: 12-May-1930, 80 y.o.   MRN: [id number]  HPI   Pt in for follow up. Pt has been on antibiotic for past week. She has one pill left. I did doppler of her left leg and it showed thrombosed varicocity which may represent superficial phlebitis. I advised warm compress twice daily. I thought maybe some cellulitis and placed her on antibiotic. She was already taking baby aspirin q day.    Mild nausea today. Only one piece of toast today before taking antibiotic. No other associated GI Signs and symptoms.   Past Medical History  Diagnosis Date  . Headache(784.0)     migraines  . Hyperlipidemia   . Osteoporosis   . CVA (cerebral infarction)   . Hemorrhoids   . Hyperplastic colon polyp 06/2001  . Diverticulitis   . Varicose vein   . DVT (deep venous thrombosis)     after venous ablation, R leg  . Fracture 09/10/07    L2, status post vertebroplasty of L2 performed by IR  . Cardiomyopathy     Probable Takotsubo, severe CP w/ normal cath in 1994. Severe CP in 2003 w/ widespread T wave inversions on ECG. Cath w/ minimal coronary disease but LV-gram showed periapical severe hypokinesis and basilar hyperkinesia (EF 40%). Last echo in 4/09 confirmed full LV functional recovery with EF 60%, no regional wall motion abnormalities, mild to moderate LVH.  . OSA (obstructive sleep apnea) 03-29-11    no cpap used, not a problem now.  Marland Kitchen CAD (coronary artery disease)     mild nonobstructive disease on cath in 2003  . Hypertension   . GERD (gastroesophageal reflux disease)   . Hiatal hernia 03-29-11    no nerve problems  . Depression with anxiety 03-29-11    lost husband 3'09  . OA (osteoarthritis) of knee 03-29-11    w/ bilateral knee pain-not a problem now  . Stroke 03-29-11    CVA x2 -last 10'12-?TIA(visual problems)  . E. coli gastroenteritis 03-29-11    8'10  . Cancer   . Polycythemia     History   Social History  . Marital  Status: Widowed    Spouse Name: N/A    Number of Children: 3  . Years of Education: N/A   Occupational History  . n/a    Social History Main Topics  . Smoking status: Former Smoker -- 1.00 packs/day for 37 years    Types: Cigarettes    Start date: 06/26/1951    Quit date: 05/14/1988  . Smokeless tobacco: Never Used     Comment: quit 25 years ago  . Alcohol Use: 0.6 oz/week    1 Cans of beer per week     Comment: occasional/social  . Drug Use: No  . Sexual Activity: No   Other Topics Concern  . Not on file   Social History Narrative   Lost husband 07/29/07-    Lives in [place] in a town house, her daughter moved in w/ her   Drives                 Past Surgical History  Procedure Laterality Date  . Appendectomy    . Ovarian cyst surgery      left  . Tonsillectomy    . Back surgery    . Tubal ligation    . Dental extraction      L maxillary molar  .  Kyphosis surgery  08/2007    cement used  . Total knee arthroplasty  06/2010    left  . Cholecystectomy  04/09/2011    Procedure: LAPAROSCOPIC CHOLECYSTECTOMY WITH INTRAOPERATIVE CHOLANGIOGRAM;  Surgeon: Odis Hollingshead, MD;  Location: [facility];  Service: General;  Laterality: N/A;  . Breast biopsy    . Breast cyst excision    . Breast lumpectomy      left stage I left breast cancer    Family History  Problem Relation Age of Onset  . Colon cancer Other     cousin  . Breast cancer Mother   . Breast cancer Sister   . Heart disease Father     MIs  . Leukemia Brother     GM    Allergies  Allergen Reactions  . Atorvastatin Diarrhea    REACTION: diarrhe  . Ezetimibe     REACTION: nausea  . Fluvastatin Sodium Other (See Comments)    "Can't remember"  . Magnesium Hydroxide Other (See Comments)    REACTION: triggers HAs  . Meloxicam Other (See Comments)    Pt seeing auro's / spots.    . Pneumovax [Pneumococcal Polysaccharide Vaccine]     Local reaction  . Quinapril Hcl Other (See Comments)     03-29-11 "feelings of tiredness"  . Simvastatin Diarrhea  . Topamax [Topiramate] Itching  . Valsartan Itching and Other (See Comments)    REACTION: toes itch  . Vit D-Vit E-Safflower Oil Other (See Comments)    Headaches  . Carvedilol     "teeth hurt when I took it"  . Penicillins Other (See Comments) and Rash    Other Reaction: OTHER REACTION Mouth blisters 03-29-11    Current Outpatient Prescriptions on File Prior to Visit  Medication Sig Dispense Refill  . acetaminophen-codeine (TYLENOL #3) 300-30 MG per tablet Take 1 tablet by mouth every 8 (eight) hours as needed. For migraine 120 tablet 0  . aspirin 81 MG tablet Take 162 mg by mouth daily.    . Cholecalciferol (VITAMIN D) 2000 UNITS tablet Take 2,000 Units by mouth daily.    Marland Kitchen doxycycline (VIBRA-TABS) 100 MG tablet Take 1 tablet (100 mg total) by mouth 2 (two) times daily. 14 tablet 0  . hydrochlorothiazide (HYDRODIURIL) 25 MG tablet Take 1 tablet (25 mg total) by mouth daily as needed (swelling in the legs ). 90 tablet 2  . hydroxyurea (HYDREA) 500 MG capsule TAKE ONE CAPSULE BY MOUTH EVERY OTHER DAY, MAY TAKE WITH FOOD TO MINIMIZE SIDE EFFECTS 90 capsule 0  . letrozole (FEMARA) 2.5 MG tablet Take 1 tablet (2.5 mg total) by mouth daily. 90 tablet 4  . LORazepam (ATIVAN) 0.5 MG tablet TAKE 1 TABLET BY MOUTH TWICE DAILY AS NEEDED 60 tablet 4  . metoprolol (LOPRESSOR) 50 MG tablet Take 1 tablet (50 mg total) by mouth 2 (two) times daily. 60 tablet 6  . omeprazole (PRILOSEC OTC) 20 MG tablet Take 20 mg by mouth daily.     . Probiotic Product (PROBIOTIC DAILY PO) Take by mouth every morning.    . vitamin E 400 UNIT capsule Take 400 Units by mouth daily.      No current facility-administered medications on file prior to visit.    BP 145/82 mmHg  Pulse 70  Temp(Src) 98 F (36.7 C) (Oral)  Ht 5' 4.75" (1.645 m)  Wt 167 lb 3.2 oz (75.841 kg)  BMI 28.03 kg/m2  SpO2 98%    Review of Systems  Constitutional: Negative for  fever,  chills and fatigue.  HENT: Negative for trouble swallowing.   Respiratory: Negative for cough, chest tightness, shortness of breath and wheezing.   Cardiovascular: Negative for chest pain and palpitations.  Gastrointestinal: Positive for nausea. Negative for vomiting, abdominal pain, diarrhea, constipation, blood in stool, abdominal distention, anal bleeding and rectal pain.       Only on coming into office.   Genitourinary: Positive for vaginal pain. Negative for urgency, frequency, hematuria, flank pain, difficulty urinating, pelvic pain and dyspareunia.  Musculoskeletal: Negative for back pain.  Skin:       Rt calf mild pinkish red color over palpable slight indurate varicose vein. SEE HPI and physical exam.  Neurological: Negative for dizziness, seizures, facial asymmetry, weakness, light-headedness, numbness and headaches.  Hematological: Negative for adenopathy.       Objective:   Physical Exam  General- no acute distress. Lungs- CTA Heart- RRR Rt lower extremity-3 inch area of mild redness and tortuous varocise vein feels hard. Same region that I saw her for last time. Calf is not swollen and negative homans sign.  Abdomen- soft, nontender, nondistended, +BS, no rebound or guarding and no organomegaly. Back- no cva tenderness        Assessment & Plan:

## 2014-04-12 NOTE — Progress Notes (Signed)
Pre visit review using our clinic review tool, if applicable. No additional management support is needed unless otherwise documented below in the visit note. 

## 2014-04-12 NOTE — Assessment & Plan Note (Signed)
Gave 3 more days of doxycycline today. Faint redness likely from superficial phlebitis but did decide to extend her antibiotic for 3 more days in case mild infection present.

## 2014-04-13 ENCOUNTER — Other Ambulatory Visit: Payer: Self-pay | Admitting: Internal Medicine

## 2014-04-13 NOTE — Telephone Encounter (Signed)
Pt is requesting refill on Lorazepam  Last OV: 03/19/2014 Last Fill: 10/16/2013 # 60 1RF UDS: 09/02/2013 Low risk  Please advise.

## 2014-04-13 NOTE — Telephone Encounter (Signed)
OK to refill Lorazepam with same strength, same sig, #60 with no refills

## 2014-04-14 NOTE — Telephone Encounter (Signed)
Done rx called in.

## 2014-05-04 IMAGING — CT CT HEAD W/O CM
2 series · 16 of 30 positions shown, 19 images · non-contrast
Comparison: 07/05/2006

CLINICAL DATA: Weakness

CT HEAD WITHOUT CONTRAST
TECHNIQUE: Contiguous axial images were obtained from the base of
the skull through the vertex without contrast.

[Series 2: head w/o · axial · non-contrast · 0.46mm/px · z∈[-164,-54]mm · 9 of 28 slices shown, 12 images]
[im 3/28  brain]
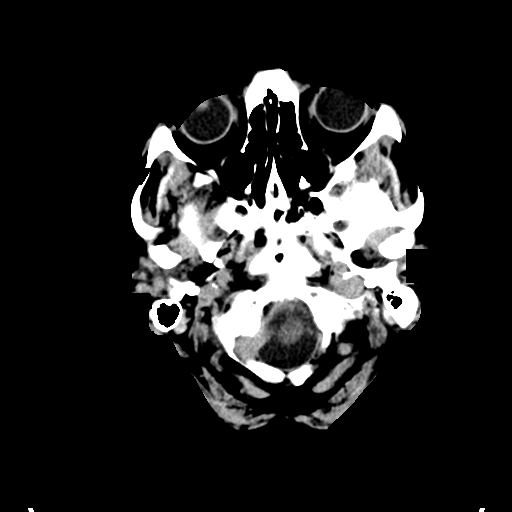
[im 3/28  bone]
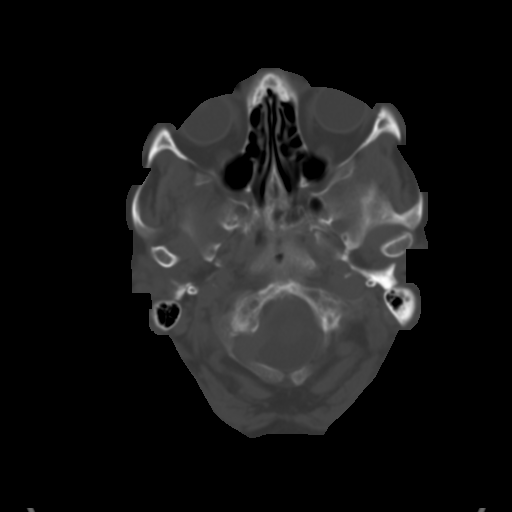
[im 6/28  brain]
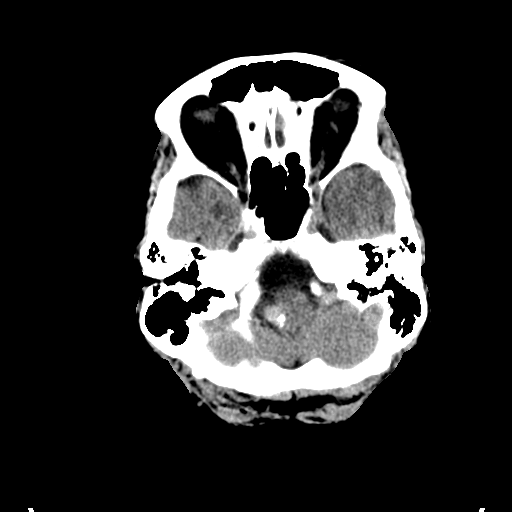
[im 9/28  brain]
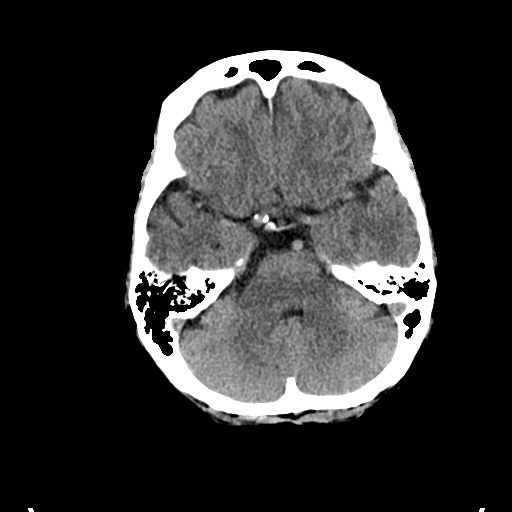
[im 11/28  brain]
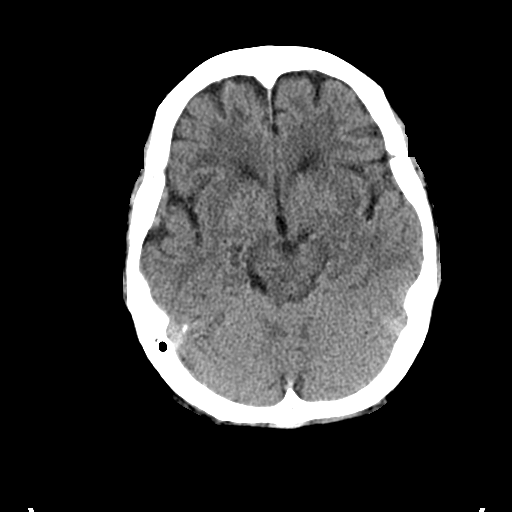
[im 14/28  brain]
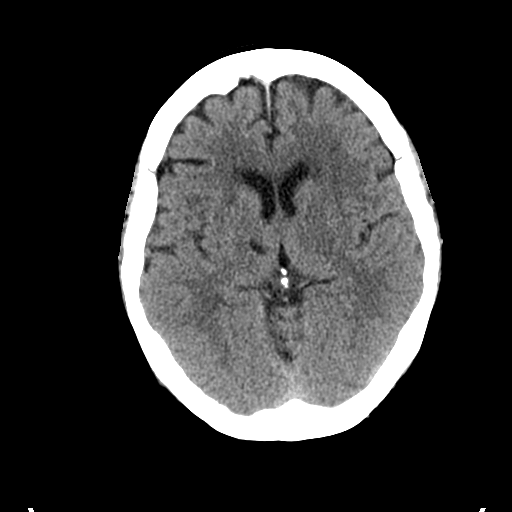
[im 14/28  bone]
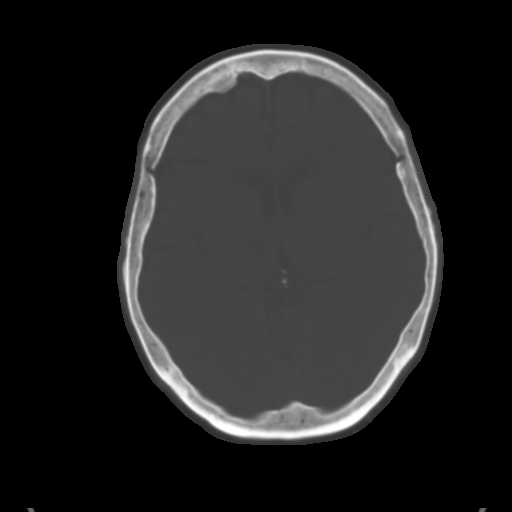
[im 17/28  brain]
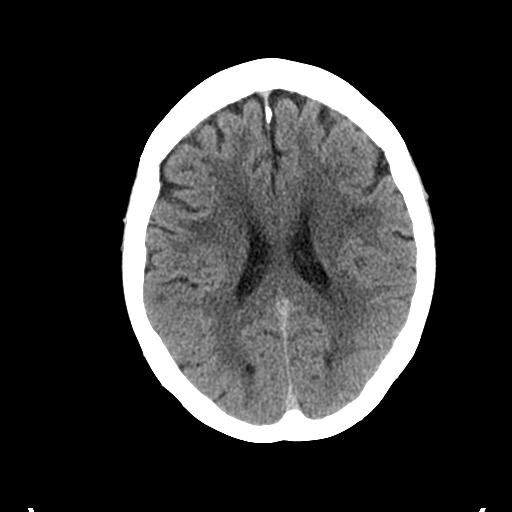
[im 19/28  brain]
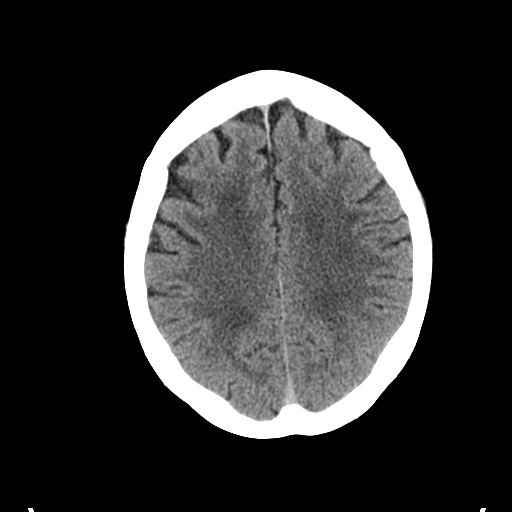
[im 22/28  brain]
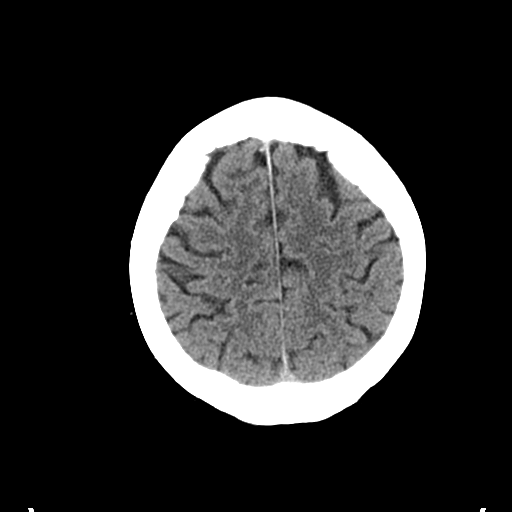
[im 25/28  brain]
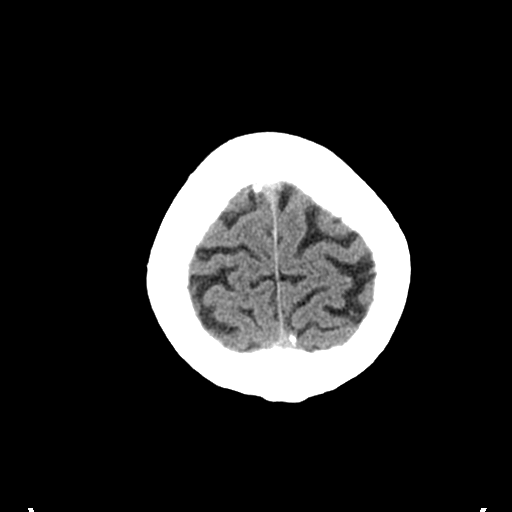
[im 25/28  bone]
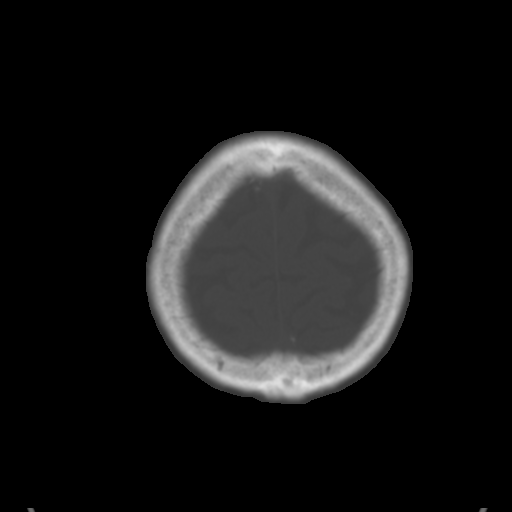

[Series 3: bone windows · axial · 0.46mm/px · z∈[-159,-69]mm · 7 of 47 slices shown]
[im 6/47  bone]
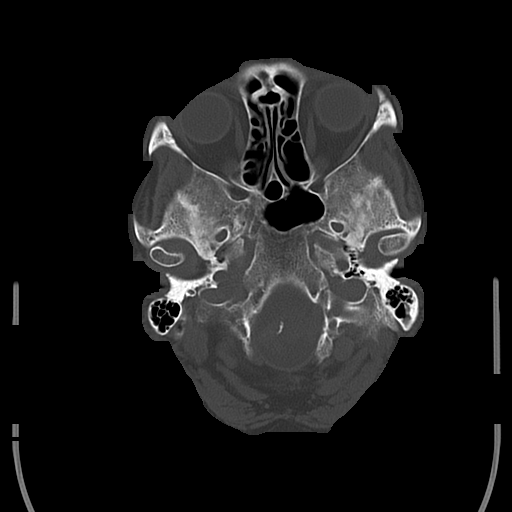
[im 11/47  bone]
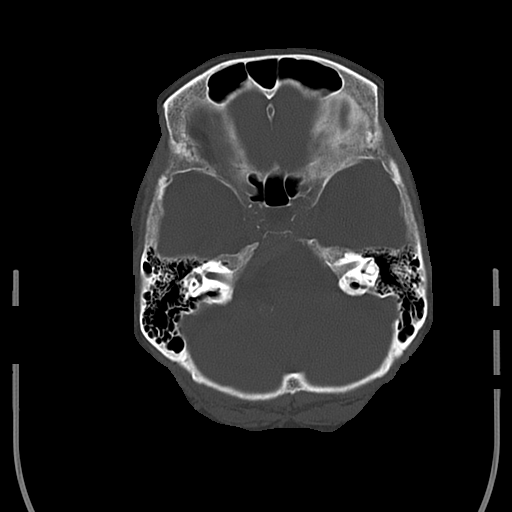
[im 16/47  bone]
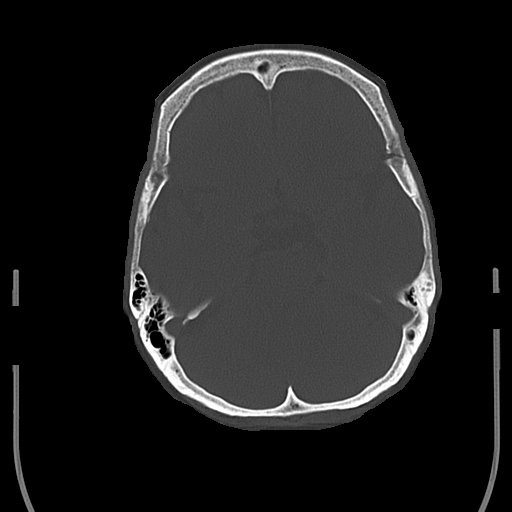
[im 21/47  bone]
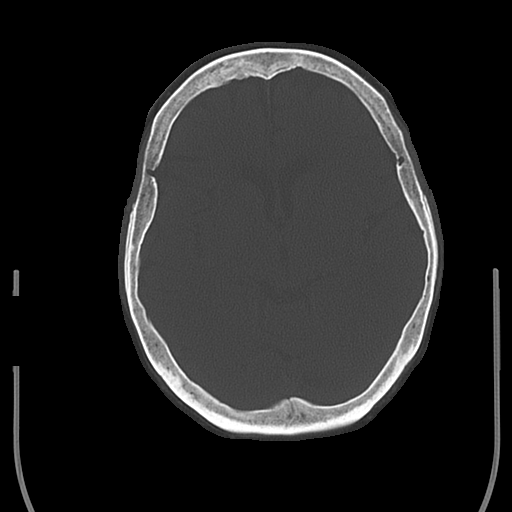
[im 26/47  bone]
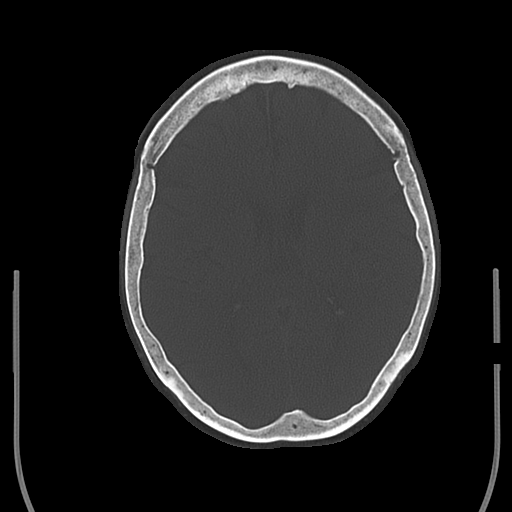
[im 31/47  bone]
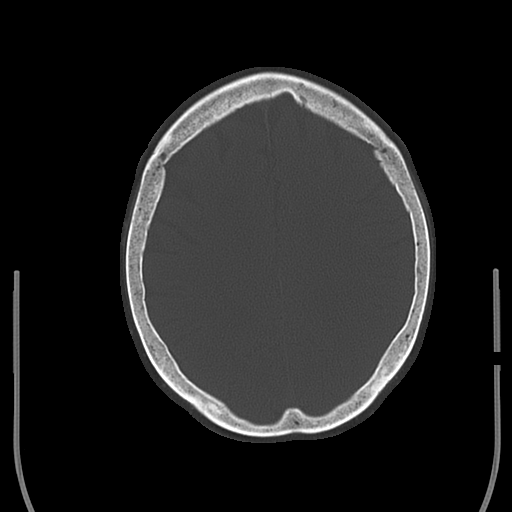
[im 36/47  bone]
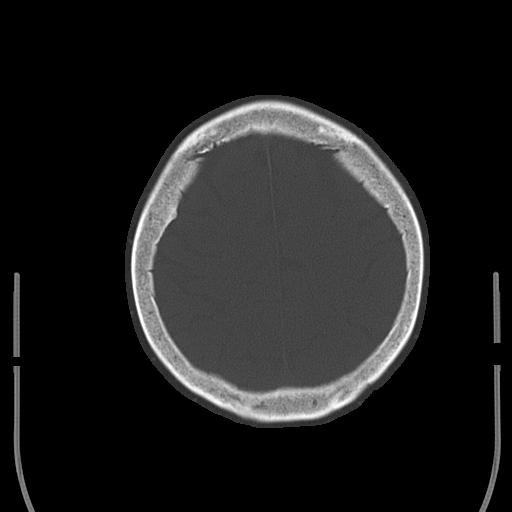

[16 of 30 positions shown; findings below may reference images not displayed]

FINDINGS: No skull fracture is noted.  Paranasal sinuses and
mastoid air cells are unremarkable.  No intracranial hemorrhage,
mass effect or midline shift.  Stable mild cerebral atrophy.
Stable small lacunar infarct in the right thalamus.  Mild
periventricular and patchy subcortical white matter decreased
attenuation probable due to chronic small vessel ischemic changes.

No definite acute cortical infarction.  Stable prominent size right
vertebral artery with atherosclerotic calcifications.  No mass
lesion is noted on this unenhanced scan.
IMPRESSION: No acute intracranial abnormality.  No definite acute cortical
infarction.  Stable small lacunar infarct right thalamus.  Mild
cerebral atrophy.  Mild periventricular and patchy subcortical
white matter decreased attenuation probable due to chronic small
vessel ischemic changes.

## 2014-05-20 ENCOUNTER — Encounter: Payer: Self-pay | Admitting: Internal Medicine

## 2014-05-20 ENCOUNTER — Other Ambulatory Visit: Payer: Self-pay | Admitting: Internal Medicine

## 2014-05-20 ENCOUNTER — Ambulatory Visit (INDEPENDENT_AMBULATORY_CARE_PROVIDER_SITE_OTHER): Payer: Medicare Other | Admitting: Internal Medicine

## 2014-05-20 VITALS — BP 123/78 | HR 62 | Temp 98.2°F | Ht 65.0 in | Wt 165.5 lb

## 2014-05-20 DIAGNOSIS — R1013 Epigastric pain: Secondary | ICD-10-CM | POA: Diagnosis not present

## 2014-05-20 DIAGNOSIS — K30 Functional dyspepsia: Secondary | ICD-10-CM

## 2014-05-20 DIAGNOSIS — I809 Phlebitis and thrombophlebitis of unspecified site: Secondary | ICD-10-CM | POA: Diagnosis not present

## 2014-05-20 DIAGNOSIS — I10 Essential (primary) hypertension: Secondary | ICD-10-CM | POA: Diagnosis not present

## 2014-05-20 MED ORDER — AMLODIPINE BESYLATE 5 MG PO TABS: 5.0000 mg | ORAL_TABLET | Freq: Every day | ORAL | Status: DC

## 2014-05-20 NOTE — Progress Notes (Signed)
Subjective:    Patient ID: [name], female    DOB: [birth year], 81 y.o.   MRN: [id number]  DOS:  05/20/2014 Type of visit - description : acute, several concerns Interval history: BP has been check on and off and it is slightly elevated: This week: 193/58, 151/70, 150/85. Pulse range from 60-80.  during December for 3 weeks she had "indigestion". Symptoms were at night, like a burning going from the upper abdomen to the anterior chest bilaterally, usually after dinner, last a few hours, described as burning sensation or heartburn. Last episode 05/11/2014, now asymptomatic.  Was seen with phlebitis, doing better, has been taking a lot of extra aspirins  ROS Occasionally difficulty breathing at baseline, no palpitations. No nausea, vomiting, change in the color of the stools.   Past Medical History  Diagnosis Date  . Headache(784.0)     migraines  . Hyperlipidemia   . Osteoporosis   . CVA (cerebral infarction)   . Hemorrhoids   . Hyperplastic colon polyp 06/2001  . Diverticulitis   . Varicose vein   . DVT (deep venous thrombosis)     after venous ablation, R leg  . Fracture 09/10/07    L2, status post vertebroplasty of L2 performed by IR  . Cardiomyopathy     Probable Takotsubo, severe CP w/ normal cath in 1994. Severe CP in 2003 w/ widespread T wave inversions on ECG. Cath w/ minimal coronary disease but LV-gram showed periapical severe hypokinesis and basilar hyperkinesia (EF 40%). Last echo in 4/09 confirmed full LV functional recovery with EF 60%, no regional wall motion abnormalities, mild to moderate LVH.  . OSA (obstructive sleep apnea) 03-29-11    no cpap used, not a problem now.  Marland Kitchen CAD (coronary artery disease)     mild nonobstructive disease on cath in 2003  . Hypertension   . GERD (gastroesophageal reflux disease)   . Hiatal hernia 03-29-11    no nerve problems  . Depression with anxiety 03-29-11    lost husband 3'09  . OA (osteoarthritis) of knee  03-29-11    w/ bilateral knee pain-not a problem now  . Stroke 03-29-11    CVA x2 -last 10'12-?TIA(visual problems)  . E. coli gastroenteritis 03-29-11    8'10  . Cancer   . Polycythemia     Past Surgical History  Procedure Laterality Date  . Appendectomy    . Ovarian cyst surgery      left  . Tonsillectomy    . Back surgery    . Tubal ligation    . Dental extraction      L maxillary molar  . Kyphosis surgery  08/2007    cement used  . Total knee arthroplasty  06/2010    left  . Cholecystectomy  04/09/2011    Procedure: LAPAROSCOPIC CHOLECYSTECTOMY WITH INTRAOPERATIVE CHOLANGIOGRAM;  Surgeon: Odis Hollingshead, MD;  Location: [facility];  Service: General;  Laterality: N/A;  . Breast biopsy    . Breast cyst excision    . Breast lumpectomy      left stage I left breast cancer    History   Social History  . Marital Status: Widowed    Spouse Name: N/A    Number of Children: 3  . Years of Education: N/A   Occupational History  . n/a    Social History Main Topics  . Smoking status: Former Smoker -- 1.00 packs/day for 37 years    Types: Cigarettes    Start date: 06/26/1951  Quit date: 05/14/1988  . Smokeless tobacco: Never Used     Comment: quit 25 years ago  . Alcohol Use: 0.6 oz/week    1 Cans of beer per week     Comment: occasional/social  . Drug Use: No  . Sexual Activity: No   Other Topics Concern  . Not on file   Social History Narrative   Lost husband 07/29/07-    Lives in [place] in a town house, her daughter moved in w/ her   Drives                     Medication List       This list is accurate as of: 05/20/14 11:59 PM.  Always use your most recent med list.               acetaminophen-codeine 300-30 MG per tablet  Commonly known as:  TYLENOL #3  Take 1 tablet by mouth every 8 (eight) hours as needed. For migraine     amLODipine 5 MG tablet  Commonly known as:  NORVASC  TAKE 1 TABLET BY MOUTH DAILY     aspirin 81 MG  tablet  Take 81 mg by mouth daily.     hydrochlorothiazide 25 MG tablet  Commonly known as:  HYDRODIURIL  Take 1 tablet (25 mg total) by mouth daily as needed (swelling in the legs ).     hydroxyurea 500 MG capsule  Commonly known as:  HYDREA  TAKE ONE CAPSULE BY MOUTH EVERY OTHER DAY, MAY TAKE WITH FOOD TO MINIMIZE SIDE EFFECTS     letrozole 2.5 MG tablet  Commonly known as:  FEMARA  Take 1 tablet (2.5 mg total) by mouth daily.     LORazepam 0.5 MG tablet  Commonly known as:  ATIVAN  TAKE 1 TABLET BY MOUTH TWICE DAILY AS NEEDED     metoprolol 50 MG tablet  Commonly known as:  LOPRESSOR  Take 1 tablet (50 mg total) by mouth 2 (two) times daily.     omeprazole 20 MG tablet  Commonly known as:  PRILOSEC OTC  Take 20 mg by mouth daily.     PROBIOTIC DAILY PO  Take by mouth every morning.     Vitamin D 2000 UNITS tablet  Take 2,000 Units by mouth daily.     vitamin E 400 UNIT capsule  Take 400 Units by mouth daily.           Objective:   Physical Exam  Musculoskeletal:       Legs:  BP 123/78 mmHg  Pulse 62  Temp(Src) 98.2 F (36.8 C) (Oral)  Ht 5\' 5"  (1.651 m)  Wt 165 lb 8 oz (75.07 kg)  BMI 27.54 kg/m2  SpO2 96% General -- alert, well-developed, NAD.   Lungs -- normal respiratory effort, no intercostal retractions, no accessory muscle use, and normal breath sounds.  Heart-- normal rate, regular rhythm, no murmur.  Extremities-- no pretibial edema bilaterally  Neurologic--  alert & oriented X3. Speech normal, gait appropriate for age, strength symmetric and appropriate for age.  Psych-- Cognition and judgment appear intact. Cooperative with normal attention span and concentration. No anxious or depressed appearing.        Assessment & Plan:    Phlebitis, resolved, recommend to go back on aspirin 81 mg daily  "Indigestion", symptoms are atypical for CAD, symptoms resolve ~ 10 days ago, doing all her ADLs without problems. Related to increase aspirin intake  due to phlebitis? Recommend observation  Today , I spent more than  25 min with the patient: >50% of the time counseling regards  Indigestion, explaining why I don't think is CAD but recommending carefull  observation

## 2014-05-20 NOTE — Progress Notes (Signed)
Pre visit review using our clinic review tool, if applicable. No additional management support is needed unless otherwise documented below in the visit note. 

## 2014-05-20 NOTE — Patient Instructions (Signed)
Add amlodipine 5 mg one tablet daily  Check the  blood pressure daily  Be sure your blood pressure is between  145/85  and 110/65.  if it is consistently higher or lower, let me know    Schedule a visit for to 5 weeks from today  Take aspirin 81 mg only one tablet daily

## 2014-05-21 NOTE — Assessment & Plan Note (Signed)
will attempt to get better control. Continue with metoprolol 50 mg twice a day and add a low-dose of amlodipine.

## 2014-05-24 ENCOUNTER — Other Ambulatory Visit: Payer: Self-pay | Admitting: *Deleted

## 2014-05-24 DIAGNOSIS — C50912 Malignant neoplasm of unspecified site of left female breast: Secondary | ICD-10-CM

## 2014-05-25 ENCOUNTER — Encounter: Payer: Self-pay | Admitting: Family

## 2014-05-25 ENCOUNTER — Other Ambulatory Visit (HOSPITAL_BASED_OUTPATIENT_CLINIC_OR_DEPARTMENT_OTHER): Payer: Medicare Other | Admitting: Lab

## 2014-05-25 ENCOUNTER — Ambulatory Visit: Payer: Medicare Other

## 2014-05-25 ENCOUNTER — Ambulatory Visit (HOSPITAL_BASED_OUTPATIENT_CLINIC_OR_DEPARTMENT_OTHER): Payer: Medicare Other | Admitting: Family

## 2014-05-25 DIAGNOSIS — C50912 Malignant neoplasm of unspecified site of left female breast: Secondary | ICD-10-CM | POA: Diagnosis not present

## 2014-05-25 DIAGNOSIS — D751 Secondary polycythemia: Secondary | ICD-10-CM

## 2014-05-25 DIAGNOSIS — C50812 Malignant neoplasm of overlapping sites of left female breast: Secondary | ICD-10-CM | POA: Diagnosis not present

## 2014-05-25 DIAGNOSIS — D45 Polycythemia vera: Secondary | ICD-10-CM | POA: Diagnosis not present

## 2014-05-25 LAB — IRON AND TIBC CHCC
%SAT: 7 % — ABNORMAL LOW (ref 21–57)
Iron: 31 ug/dL — ABNORMAL LOW (ref 41–142)
TIBC: 420 ug/dL (ref 236–444)
UIBC: 389 ug/dL — ABNORMAL HIGH (ref 120–384)

## 2014-05-25 LAB — COMPREHENSIVE METABOLIC PANEL
ALT: <8 U/L (ref 0–35)
AST: 15 U/L (ref 0–37)
Albumin: 3.8 g/dL (ref 3.5–5.2)
Alkaline Phosphatase: 83 U/L (ref 39–117)
BUN: 9 mg/dL (ref 6–23)
CO2: 29 mEq/L (ref 19–32)
Calcium: 9.3 mg/dL (ref 8.4–10.5)
Chloride: 100 mEq/L (ref 96–112)
Creatinine, Ser: 0.72 mg/dL (ref 0.50–1.10)
Glucose, Bld: 82 mg/dL (ref 70–99)
Potassium: 4.3 mEq/L (ref 3.5–5.3)
Sodium: 136 mEq/L (ref 135–145)
Total Bilirubin: 1.2 mg/dL (ref 0.2–1.2)
Total Protein: 6.5 g/dL (ref 6.0–8.3)

## 2014-05-25 LAB — CBC WITH DIFFERENTIAL (CANCER CENTER ONLY)
BASO#: 0.1 10*3/uL (ref 0.0–0.2)
BASO%: 0.8 % (ref 0.0–2.0)
EOS%: 2.9 % (ref 0.0–7.0)
Eosinophils Absolute: 0.2 10*3/uL (ref 0.0–0.5)
HCT: 42 % (ref 34.8–46.6)
HGB: 13 g/dL (ref 11.6–15.9)
LYMPH#: 1.1 10*3/uL (ref 0.9–3.3)
LYMPH%: 18.3 % (ref 14.0–48.0)
MCH: 25.5 pg — ABNORMAL LOW (ref 26.0–34.0)
MCHC: 31 g/dL — ABNORMAL LOW (ref 32.0–36.0)
MCV: 83 fL (ref 81–101)
MONO#: 0.6 10*3/uL (ref 0.1–0.9)
MONO%: 10.9 % (ref 0.0–13.0)
NEUT#: 4 10*3/uL (ref 1.5–6.5)
NEUT%: 67.1 % (ref 39.6–80.0)
Platelets: 390 10*3/uL (ref 145–400)
RBC: 5.09 10*6/uL (ref 3.70–5.32)
RDW: 15.1 % (ref 11.1–15.7)
WBC: 5.9 10*3/uL (ref 3.9–10.0)

## 2014-05-25 LAB — FERRITIN CHCC: Ferritin: 25 ng/ml (ref 9–269)

## 2014-05-25 LAB — RETICULOCYTES (CHCC)
ABS Retic: 56.3 10*3/uL (ref 19.0–186.0)
RBC.: 5.12 MIL/uL — ABNORMAL HIGH (ref 3.87–5.11)
Retic Ct Pct: 1.1 % (ref 0.4–2.3)

## 2014-05-25 NOTE — Progress Notes (Signed)
No phlebotomy today per NP.

## 2014-05-25 NOTE — Progress Notes (Signed)
[name]  [phone] [phone]) [phone]  ID: [name] OB: February 15, 1930 MR#: [id number]#:[id number] Patient Care Team: Colon Branch, MD as PCP - General Troy Sine, MD as Consulting Physician (Cardiology) Eliezer Bottom, NP as Nurse Practitioner (Nurse Practitioner) Volanda Napoleon, MD as Consulting Physician (Oncology)  DIAGNOSIS: Polycythemia vera-JAK2 positive Stage I (T1BN0M0 ) infiltrating duct carcinoma the left breast  INTERVAL HISTORY: [name] is here today for a follow-up. She has been doing well.  She still has some SOB with exertion. She denies fever, chills, n/v, cough, rash, headache, dizziness, chest pain, palpitations, abdominal pain, constipation, diarrhea, blood in urine or stool. She has had no pain or bleeding. She takes an 81 mg aspirin daily.  The pain in her right breast is better. Her mammogram in November showed no abnormality or evidence of malignancy.  She has had no swelling, tenderness, numbness or tingling.   Her weight is stable. Her appetite is good and she is drinking plenty of fluids.  She is doing self breast exams and states that there have been no changes.    She is doing well with the Hydrea.  In November, her ferritin was 29 with an iron saturation of 9%.  CURRENT TREATMENT: Phlebotomy to maintain hematocrit below 45% Hydrea 500 mg by mouth every day  Femara 2.5 mg by mouth daily  REVIEW OF SYSTEMS: All other 10 point review of systems is negative.   PAST MEDICAL HISTORY: Past Medical History  Diagnosis Date  . Headache(784.0)     migraines  . Hyperlipidemia   . Osteoporosis   . CVA (cerebral infarction)   . Hemorrhoids   . Hyperplastic colon polyp 06/2001  . Diverticulitis   . Varicose vein   . DVT (deep venous thrombosis)     after venous ablation, R leg  . Fracture 09/10/07    L2, status post vertebroplasty of L2 performed by IR  . Cardiomyopathy     Probable Takotsubo, severe CP w/  normal cath in 1994. Severe CP in 2003 w/ widespread T wave inversions on ECG. Cath w/ minimal coronary disease but LV-gram showed periapical severe hypokinesis and basilar hyperkinesia (EF 40%). Last echo in 4/09 confirmed full LV functional recovery with EF 60%, no regional wall motion abnormalities, mild to moderate LVH.  . OSA (obstructive sleep apnea) 03-29-11    no cpap used, not a problem now.  Marland Kitchen CAD (coronary artery disease)     mild nonobstructive disease on cath in 2003  . Hypertension   . GERD (gastroesophageal reflux disease)   . Hiatal hernia 03-29-11    no nerve problems  . Depression with anxiety 03-29-11    lost husband 3'09  . OA (osteoarthritis) of knee 03-29-11    w/ bilateral knee pain-not a problem now  . Stroke 03-29-11    CVA x2 -last 10'12-?TIA(visual problems)  . E. coli gastroenteritis 03-29-11    8'10  . Cancer   . Polycythemia    PAST SURGICAL HISTORY: Past Surgical History  Procedure Laterality Date  . Appendectomy    . Ovarian cyst surgery      left  . Tonsillectomy    . Back surgery    . Tubal ligation    . Dental extraction      L maxillary molar  . Kyphosis surgery  08/2007    cement used  . Total knee arthroplasty  06/2010    left  . Cholecystectomy  04/09/2011  Procedure: LAPAROSCOPIC CHOLECYSTECTOMY WITH INTRAOPERATIVE CHOLANGIOGRAM;  Surgeon: Odis Hollingshead, MD;  Location: [facility];  Service: General;  Laterality: N/A;  . Breast biopsy    . Breast cyst excision    . Breast lumpectomy      left stage I left breast cancer   FAMILY HISTORY Family History  Problem Relation Age of Onset  . Colon cancer Other     cousin  . Breast cancer Mother   . Breast cancer Sister   . Heart disease Father     MIs  . Leukemia Brother     GM   GYNECOLOGIC HISTORY:  No LMP recorded. Patient is postmenopausal.   SOCIAL HISTORY:  History   Social History  . Marital Status: Widowed    Spouse Name: N/A    Number of Children: 3  . Years of  Education: N/A   Occupational History  . n/a    Social History Main Topics  . Smoking status: Former Smoker -- 1.00 packs/day for 37 years    Types: Cigarettes    Start date: 06/26/1951    Quit date: 05/14/1988  . Smokeless tobacco: Never Used     Comment: quit 25 years ago  . Alcohol Use: 0.6 oz/week    1 Cans of beer per week     Comment: occasional/social  . Drug Use: No  . Sexual Activity: No   Other Topics Concern  . Not on file   Social History Narrative   Lost husband 07/29/07-    Lives in [place] in a town house, her daughter moved in w/ her   Drives                ADVANCED DIRECTIVES: <no information>  HEALTH MAINTENANCE: History  Substance Use Topics  . Smoking status: Former Smoker -- 1.00 packs/day for 37 years    Types: Cigarettes    Start date: 06/26/1951    Quit date: 05/14/1988  . Smokeless tobacco: Never Used     Comment: quit 25 years ago  . Alcohol Use: 0.6 oz/week    1 Cans of beer per week     Comment: occasional/social   Colonoscopy: PAP: Bone density: Lipid panel:  Allergies  Allergen Reactions  . Atorvastatin Diarrhea    REACTION: diarrhe  . Ezetimibe     REACTION: nausea  . Fluvastatin Sodium Other (See Comments)    "Can't remember"  . Magnesium Hydroxide Other (See Comments)    REACTION: triggers HAs  . Meloxicam Other (See Comments)    Pt seeing auro's / spots.    . Pneumovax [Pneumococcal Polysaccharide Vaccine]     Local reaction  . Quinapril Hcl Other (See Comments)    03-29-11 "feelings of tiredness"  . Simvastatin Diarrhea  . Topamax [Topiramate] Itching  . Valsartan Itching and Other (See Comments)    REACTION: toes itch  . Vit D-Vit E-Safflower Oil Other (See Comments)    Headaches  . Carvedilol     "teeth hurt when I took it"  . Penicillins Other (See Comments) and Rash    Other Reaction: OTHER REACTION Mouth blisters 03-29-11   Current Outpatient Prescriptions  Medication Sig Dispense Refill   . acetaminophen-codeine (TYLENOL #3) 300-30 MG per tablet Take 1 tablet by mouth every 8 (eight) hours as needed. For migraine 120 tablet 0  . amLODipine (NORVASC) 5 MG tablet TAKE 1 TABLET BY MOUTH DAILY 90 tablet 1  . aspirin 81 MG tablet Take 81 mg by mouth  daily.    . Cholecalciferol (VITAMIN D) 2000 UNITS tablet Take 2,000 Units by mouth daily.    . hydrochlorothiazide (HYDRODIURIL) 25 MG tablet Take 1 tablet (25 mg total) by mouth daily as needed (swelling in the legs ). 90 tablet 2  . hydroxyurea (HYDREA) 500 MG capsule TAKE ONE CAPSULE BY MOUTH EVERY OTHER DAY, MAY TAKE WITH FOOD TO MINIMIZE SIDE EFFECTS 90 capsule 0  . letrozole (FEMARA) 2.5 MG tablet Take 1 tablet (2.5 mg total) by mouth daily. 90 tablet 4  . LORazepam (ATIVAN) 0.5 MG tablet TAKE 1 TABLET BY MOUTH TWICE DAILY AS NEEDED 60 tablet 0  . metoprolol (LOPRESSOR) 50 MG tablet Take 1 tablet (50 mg total) by mouth 2 (two) times daily. 60 tablet 6  . omeprazole (PRILOSEC OTC) 20 MG tablet Take 20 mg by mouth daily.     . Probiotic Product (PROBIOTIC DAILY PO) Take by mouth every morning.    . vitamin E 400 UNIT capsule Take 400 Units by mouth daily.      No current facility-administered medications for this visit.   OBJECTIVE: There were no vitals filed for this visit. There is no weight on file to calculate BMI. ECOG FS:0 - Asymptomatic Ocular: Sclerae unicteric, pupils equal, round and reactive to light Ear-nose-throat: Oropharynx clear, dentition fair Lymphatic: No cervical or supraclavicular adenopathy Lungs no rales or rhonchi, good excursion bilaterally Heart regular rate and rhythm, no murmur appreciated Abd soft, nontender, positive bowel sounds MSK no focal spinal tenderness, no joint edema Neuro: non-focal, well-oriented, appropriate affect Breasts: No lesions, mass, rash or lymphadenopathy. No changes. Pt does regular self breast exams at home.   LAB RESULTS: CMP     Component Value Date/Time   NA 137  03/30/2014 1006   NA 138 06/25/2013 1356   K 4.7 03/30/2014 1006   K 4.4 06/25/2013 1356   CL 101 03/30/2014 1006   CL 101 06/25/2013 1356   CO2 26 03/30/2014 1006   CO2 32 06/25/2013 1356   GLUCOSE 103* 03/30/2014 1006   GLUCOSE 123* 06/25/2013 1356   BUN 8 03/30/2014 1006   BUN 8 06/25/2013 1356   CREATININE 0.89 03/30/2014 1006   CREATININE 0.7 06/25/2013 1356   CALCIUM 9.5 03/30/2014 1006   CALCIUM 9.3 06/25/2013 1356   PROT 7.0 03/30/2014 1006   PROT 6.8 06/25/2013 1356   ALBUMIN 4.1 03/30/2014 1006   AST 18 03/30/2014 1006   AST 22 06/25/2013 1356   ALT 12 03/30/2014 1006   ALT 13 06/25/2013 1356   ALKPHOS 95 03/30/2014 1006   ALKPHOS 85* 06/25/2013 1356   BILITOT 1.3* 03/30/2014 1006   BILITOT 1.10 06/25/2013 1356   GFRNONAA 68* 01/12/2012 1730   GFRAA 79* 01/12/2012 1730   No results found for: SPEP Lab Results  Component Value Date   WBC 5.9 05/25/2014   NEUTROABS 4.0 05/25/2014   HGB 13.0 05/25/2014   HCT 42.0 05/25/2014   MCV 83 05/25/2014   PLT 390 05/25/2014   No results found for: LABCA2 No components found for: [id number] No results for input(s): INR in the last 168 hours.  STUDIES: No results found.  ASSESSMENT/PLAN: Ms. Morr is an 81 year old white female with polycythemia and breast cancer.  She is on Hydrea for her polycythemia and is doing well with this. We will continue with the same dose.  Today her Hct is 42 so she will not need phlebotomy. She is on Femara daily and has had no evidence of recurrence  of her breast cancer. We will continue with this same therapy. We will see her back in 2 months for labs and follow-up.  She knows to call here with any questions or concerns and to go to the ED in the event of an emeregency. We can certainly see her sooner if need be.  Eliezer Bottom, NP 05/25/2014 10:44 AM

## 2014-06-04 IMAGING — CR DG CHEST 2V
2 series · 2 of 2 positions shown · non-contrast
Comparison: Chest radiograph 02/25/2011

CLINICAL DATA: Right-sided chest pain.

CHEST - 2 VIEW

[view not recorded (1 of 2)]
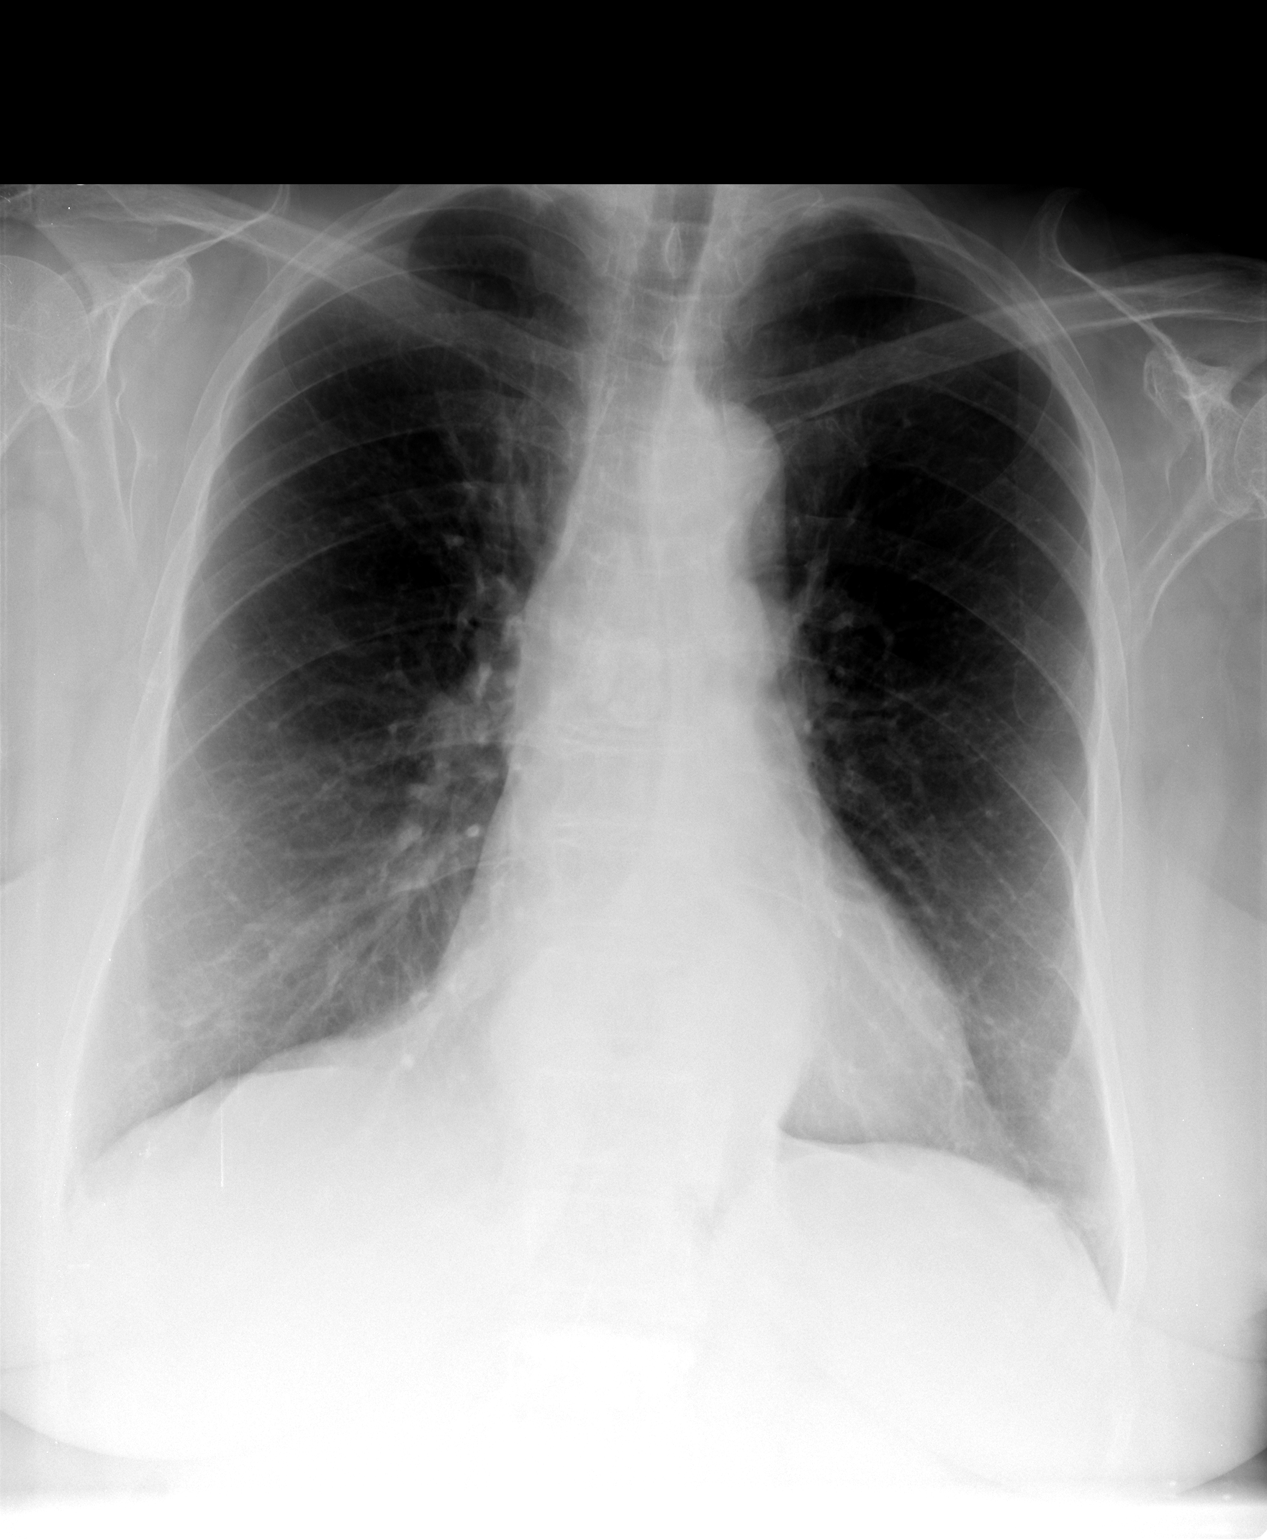

[view not recorded (2 of 2)]
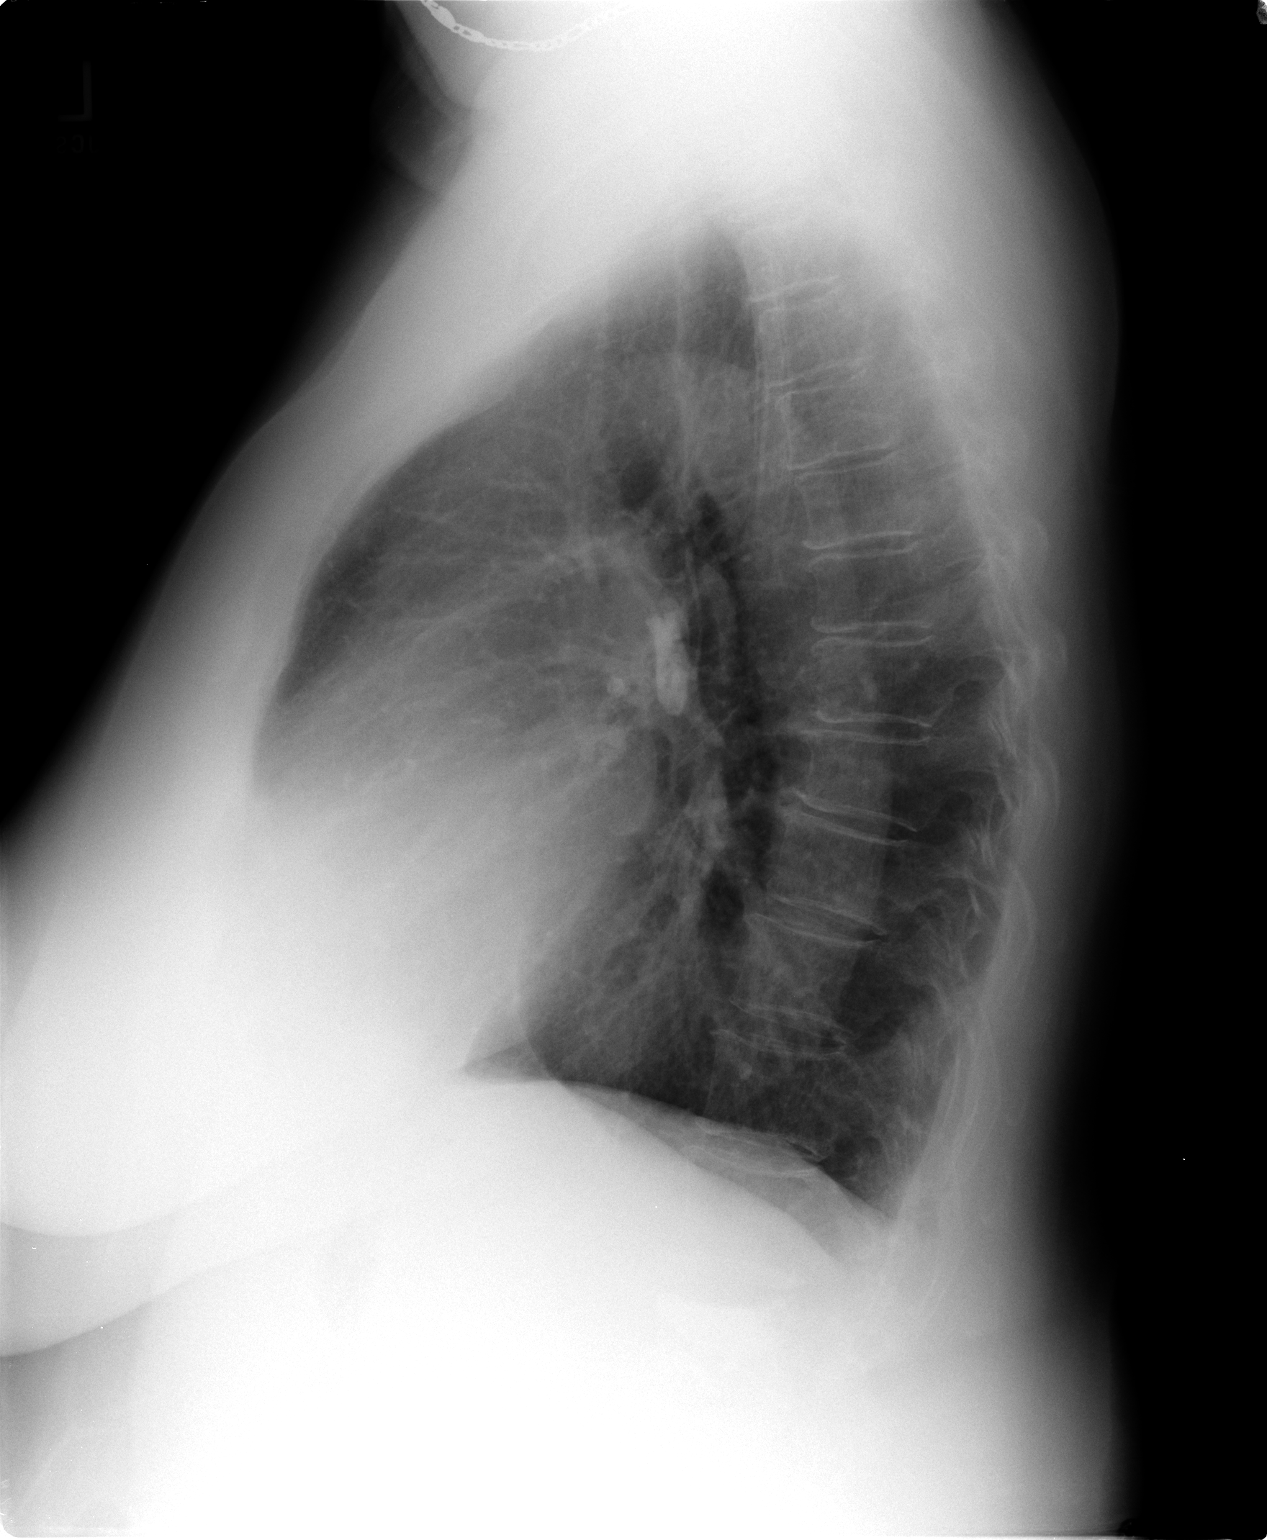

[2 of 2 positions shown; findings below may reference images not displayed]

FINDINGS: Normal cardiac silhouette.  Hiatal hernia present.  No
effusion, infiltrate, pneumothorax.  Calcified mediastinal lymph
nodes not changed from prior.  No acute osseous abnormality.
IMPRESSION: 1..  No explanation for right-sided chest pain.
2. No acute cardiopulmonary process.

## 2014-06-04 IMAGING — CR DG RIBS 2V*R*
2 series · 2 of 2 positions shown · non-contrast
Comparison: chest x-rays dated 11/19/2011 and 02/25/2011

CLINICAL DATA: Right-sided chest pain.

RIGHT RIBS - 2 VIEW

[view not recorded (1 of 2)]
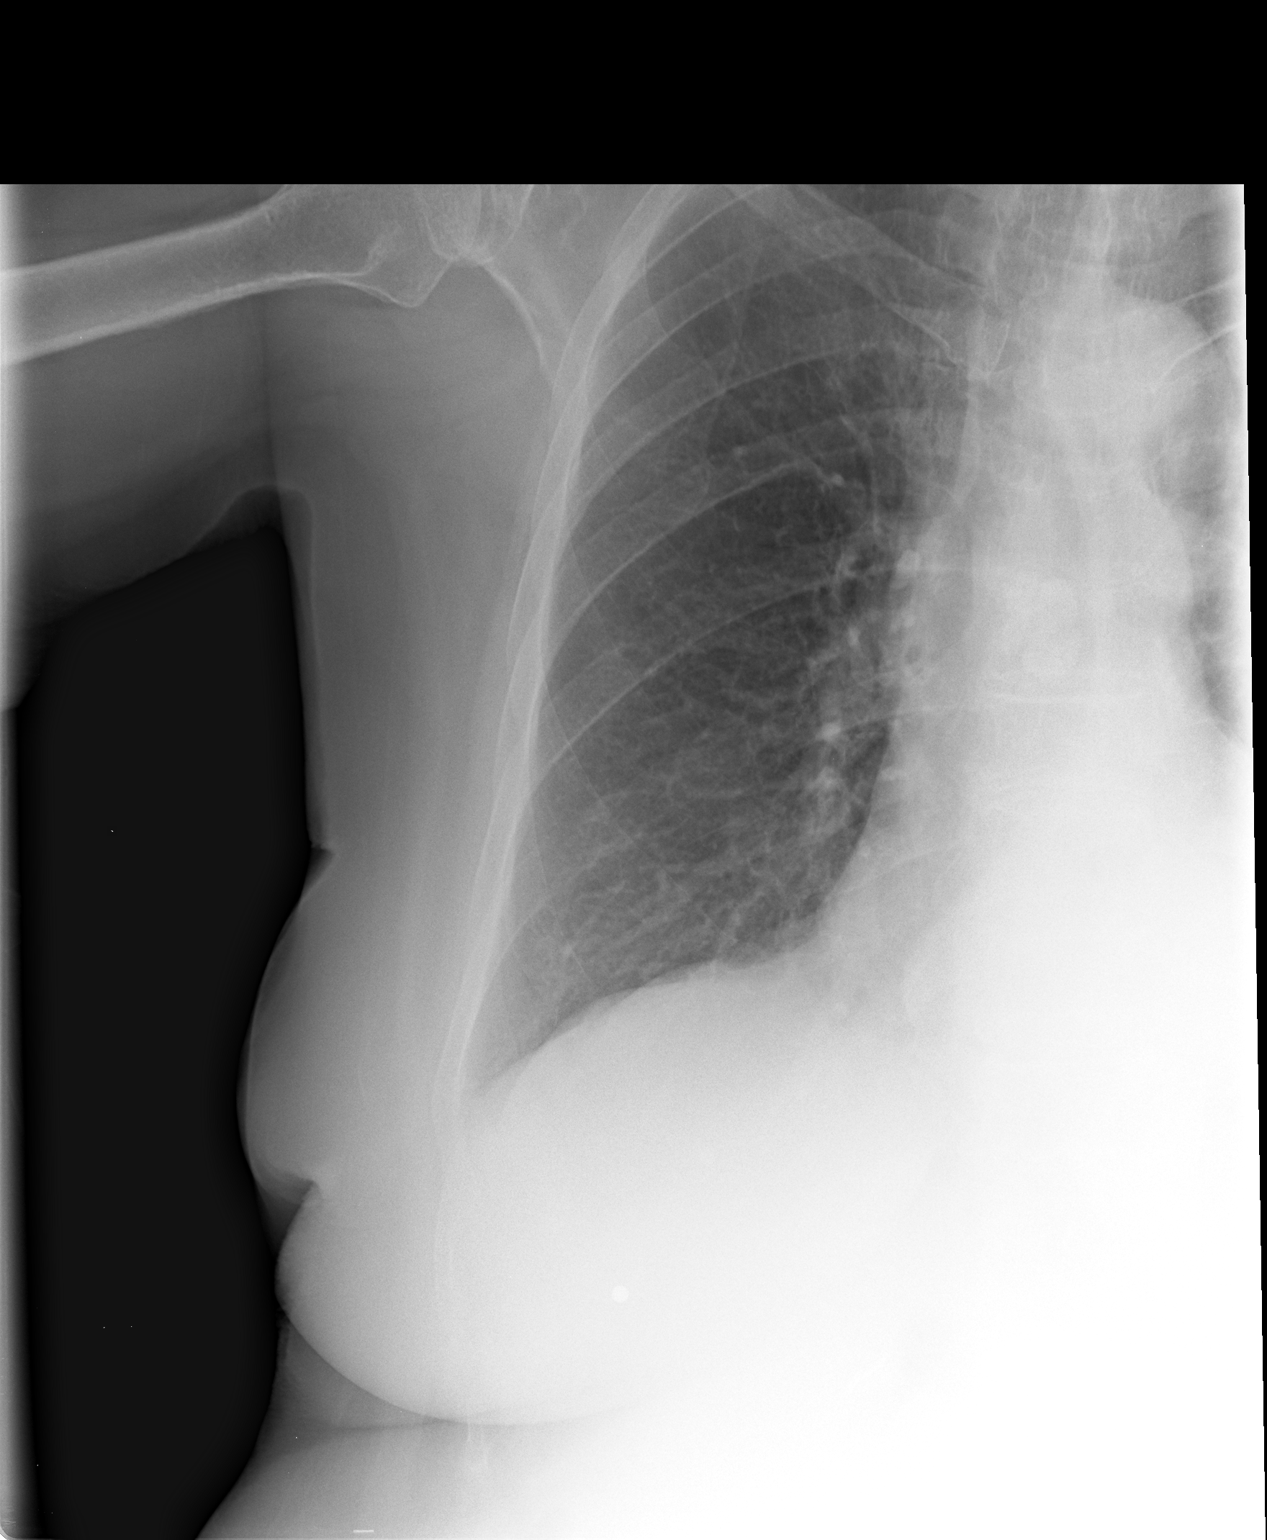

[view not recorded (2 of 2)]
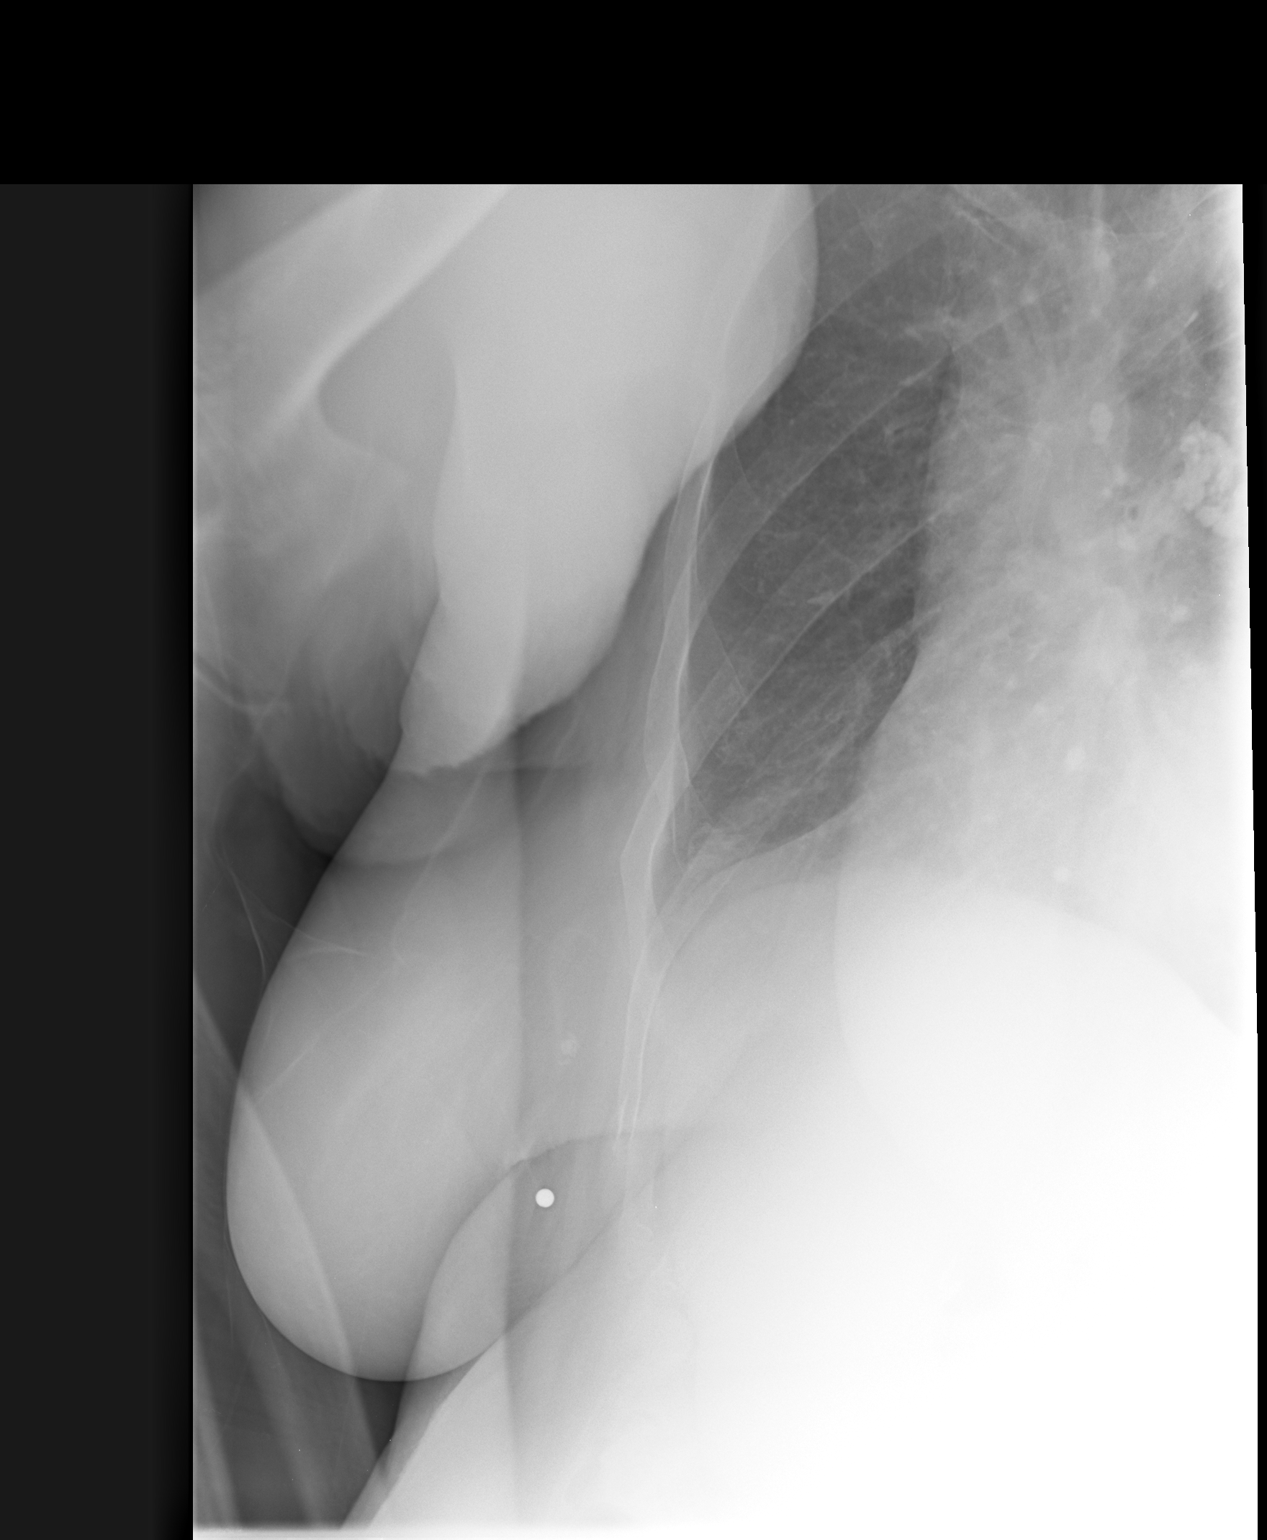

[2 of 2 positions shown; findings below may reference images not displayed]

FINDINGS: There are no visible lytic lesions in the ribs.  There is
slight deformity of the anterior aspects of the right fifth and
seventh rib is adjacent to the costochondral junctions which could
represent subtle fractures.

Note is made of a large hiatal hernia.  Right lung is clear with no
effusion or atelectasis..
IMPRESSION: Slight deformity of the anterior aspects of the right fifth and
seventh ribs which could represent nondisplaced fractures of the
ribs. No lytic lesions.

## 2014-06-28 ENCOUNTER — Ambulatory Visit: Payer: Medicare Other | Admitting: Internal Medicine

## 2014-07-05 ENCOUNTER — Ambulatory Visit (INDEPENDENT_AMBULATORY_CARE_PROVIDER_SITE_OTHER): Payer: Medicare Other | Admitting: Internal Medicine

## 2014-07-05 ENCOUNTER — Encounter: Payer: Self-pay | Admitting: Internal Medicine

## 2014-07-05 VITALS — BP 128/63 | HR 80 | Temp 98.2°F | Ht 65.0 in | Wt 170.4 lb

## 2014-07-05 DIAGNOSIS — I10 Essential (primary) hypertension: Secondary | ICD-10-CM

## 2014-07-05 DIAGNOSIS — Z79899 Other long term (current) drug therapy: Secondary | ICD-10-CM | POA: Diagnosis not present

## 2014-07-05 DIAGNOSIS — F341 Dysthymic disorder: Secondary | ICD-10-CM

## 2014-07-05 NOTE — Progress Notes (Signed)
Pre visit review using our clinic review tool, if applicable. No additional management support is needed unless otherwise documented below in the visit note. 

## 2014-07-05 NOTE — Progress Notes (Signed)
Subjective:    Patient ID: [name], female    DOB: October 29, 1929, 81 y.o.   MRN: [id number]  DOS:  07/05/2014 Type of visit - description : Follow-up from previous visit Interval history: Was seen indigestion, symptoms resolved. Hypertension, medications were adjusted and amlodipine started, she took it for 2 or 3 days and felt "bloated in the chest", since then she has discontinued it. Ambulatory BPs okay. Lorazepam, takes twice a day on regular basis, due for a UDS   Review of Systems Denies chest pain, difficulty breathing or lower extremity edema Took antibiotics for a dental problem, temporarily had diarrhea but that's largely resolved. No nausea, vomiting or blood in the stools  Past Medical History  Diagnosis Date  . Headache(784.0)     migraines  . Hyperlipidemia   . Osteoporosis   . CVA (cerebral infarction)   . Hemorrhoids   . Hyperplastic colon polyp 06/2001  . Diverticulitis   . Varicose vein   . DVT (deep venous thrombosis)     after venous ablation, R leg  . Fracture 09/10/07    L2, status post vertebroplasty of L2 performed by IR  . Cardiomyopathy     Probable Takotsubo, severe CP w/ normal cath in 1994. Severe CP in 2003 w/ widespread T wave inversions on ECG. Cath w/ minimal coronary disease but LV-gram showed periapical severe hypokinesis and basilar hyperkinesia (EF 40%). Last echo in 4/09 confirmed full LV functional recovery with EF 60%, no regional wall motion abnormalities, mild to moderate LVH.  . OSA (obstructive sleep apnea) 03-29-11    no cpap used, not a problem now.  Marland Kitchen CAD (coronary artery disease)     mild nonobstructive disease on cath in 2003  . Hypertension   . GERD (gastroesophageal reflux disease)   . Hiatal hernia 03-29-11    no nerve problems  . Depression with anxiety 03-29-11    lost husband 3'09  . OA (osteoarthritis) of knee 03-29-11    w/ bilateral knee pain-not a problem now  . Stroke 03-29-11    CVA x2 -last  10'12-?TIA(visual problems)  . E. coli gastroenteritis 03-29-11    8'10  . Cancer   . Polycythemia     Past Surgical History  Procedure Laterality Date  . Appendectomy    . Ovarian cyst surgery      left  . Tonsillectomy    . Back surgery    . Tubal ligation    . Dental extraction      L maxillary molar  . Kyphosis surgery  08/2007    cement used  . Total knee arthroplasty  06/2010    left  . Cholecystectomy  04/09/2011    Procedure: LAPAROSCOPIC CHOLECYSTECTOMY WITH INTRAOPERATIVE CHOLANGIOGRAM;  Surgeon: Odis Hollingshead, MD;  Location: [facility];  Service: General;  Laterality: N/A;  . Breast biopsy    . Breast cyst excision    . Breast lumpectomy      left stage I left breast cancer    History   Social History  . Marital Status: Widowed    Spouse Name: N/A  . Number of Children: 3  . Years of Education: N/A   Occupational History  . n/a    Social History Main Topics  . Smoking status: Former Smoker -- 1.00 packs/day for 37 years    Types: Cigarettes    Start date: 06/26/1951    Quit date: 05/14/1988  . Smokeless tobacco: Never Used     Comment: quit  25 years ago  . Alcohol Use: 0.6 oz/week    1 Cans of beer per week     Comment: occasional/social  . Drug Use: No  . Sexual Activity: No   Other Topics Concern  . Not on file   Social History Narrative   Lost husband 07/29/07-    Lives in [place] in a town house, her daughter moved in w/ her   Drives                     Medication List       This list is accurate as of: 07/05/14  9:26 PM.  Always use your most recent med list.               acetaminophen-codeine 300-30 MG per tablet  Commonly known as:  TYLENOL #3  Take 1 tablet by mouth every 8 (eight) hours as needed. For migraine     amLODipine 5 MG tablet  Commonly known as:  NORVASC  TAKE 1 TABLET BY MOUTH DAILY     aspirin 81 MG tablet  Take 81 mg by mouth daily.     hydrochlorothiazide 25 MG tablet  Commonly known  as:  HYDRODIURIL  Take 1 tablet (25 mg total) by mouth daily as needed (swelling in the legs ).     hydroxyurea 500 MG capsule  Commonly known as:  HYDREA  TAKE ONE CAPSULE BY MOUTH EVERY OTHER DAY, MAY TAKE WITH FOOD TO MINIMIZE SIDE EFFECTS     letrozole 2.5 MG tablet  Commonly known as:  FEMARA  Take 1 tablet (2.5 mg total) by mouth daily.     LORazepam 0.5 MG tablet  Commonly known as:  ATIVAN  TAKE 1 TABLET BY MOUTH TWICE DAILY AS NEEDED     metoprolol 50 MG tablet  Commonly known as:  LOPRESSOR  Take 1 tablet (50 mg total) by mouth 2 (two) times daily.     omeprazole 20 MG tablet  Commonly known as:  PRILOSEC OTC  Take 20 mg by mouth daily.     PROBIOTIC DAILY PO  Take by mouth every morning.     simethicone 125 MG chewable tablet  Commonly known as:  MYLICON  Chew 892 mg by mouth every 6 (six) hours as needed for flatulence.     Vitamin D 2000 UNITS tablet  Take 2,000 Units by mouth daily.     vitamin E 400 UNIT capsule  Take 400 Units by mouth daily.           Objective:   Physical Exam BP 128/63 mmHg  Pulse 80  Temp(Src) 98.2 F (36.8 C) (Oral)  Ht 5\' 5"  (1.651 m)  Wt 170 lb 6 oz (77.282 kg)  BMI 28.35 kg/m2  SpO2 96%  General:   Well developed, well nourished . NAD.  HEENT:  Normocephalic . Face symmetric, atraumatic Lungs:  CTA B Normal respiratory effort, no intercostal retractions, no accessory muscle use. Heart: RRR,  no murmur.  Muscle skeletal: no pretibial edema bilaterally  Skin: Not pale. Not jaundice Neurologic:  alert & oriented X3.  Speech normal, gait appropriate for age and unassisted Psych--  Cognition and judgment appear intact.  Cooperative with normal attention span and concentration.  Behavior appropriate. No anxious or depressed appearing.       Assessment & Plan:

## 2014-07-05 NOTE — Assessment & Plan Note (Signed)
Due for a UDS, will collect today

## 2014-07-05 NOTE — Assessment & Plan Note (Signed)
Since the last office visit dose of metoprolol was increased and amlodipine started, took it x 2-3  days but self discontinued it because she felt "bloated in the chest". Her blood pressures are okay most of the time, in the 130s (range 130 to 170) BPs and the office always very good. Plan: Continue with present care

## 2014-07-05 NOTE — Patient Instructions (Signed)
Please go to the lab for a UDS  Next visit in 5 months, please make an appointment

## 2014-07-27 ENCOUNTER — Telehealth: Payer: Self-pay | Admitting: Hematology & Oncology

## 2014-07-27 NOTE — Telephone Encounter (Signed)
Pt moved 3-16 to 3-31 due to dental work

## 2014-07-28 ENCOUNTER — Ambulatory Visit: Payer: Medicare Other | Admitting: Hematology & Oncology

## 2014-07-28 ENCOUNTER — Other Ambulatory Visit: Payer: Medicare Other | Admitting: Lab

## 2014-07-28 IMAGING — US US EXTREM LOW VENOUS*L*
1 series · 14 of 24 positions shown · non-contrast
Comparison: None.

CLINICAL DATA: Left calf and leg pain with history of blood clots
and varicose veins.

LEFT LOWER EXTREMITY VENOUS DUPLEX ULTRASOUND
TECHNIQUE: Gray-scale sonography with graded compression, as well
as color Doppler and duplex ultrasound, were performed to evaluate
the deep venous system of the lower extremity from the level of the
common femoral vein through the popliteal and proximal calf veins.
Spectral Doppler was utilized to evaluate flow at rest and with
distal augmentation maneuvers.

[Series 1: us extrem low venous*left* · 14 of 30 slices shown]
[im 1/30]
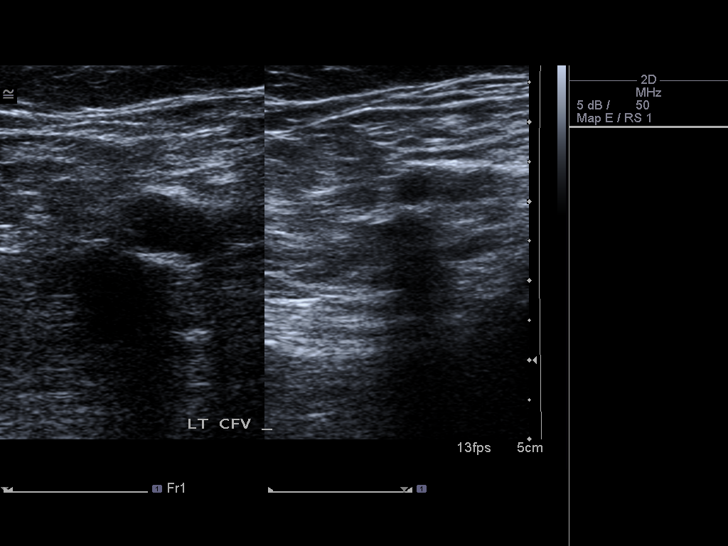
[im 3/30]
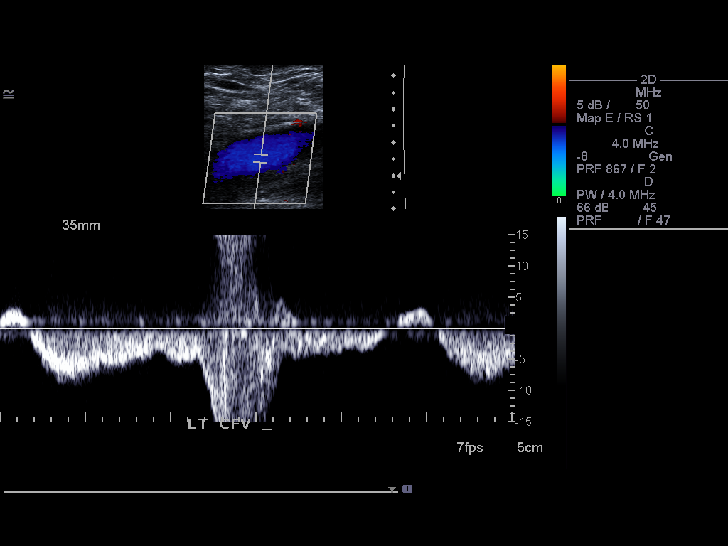
[im 6/30]
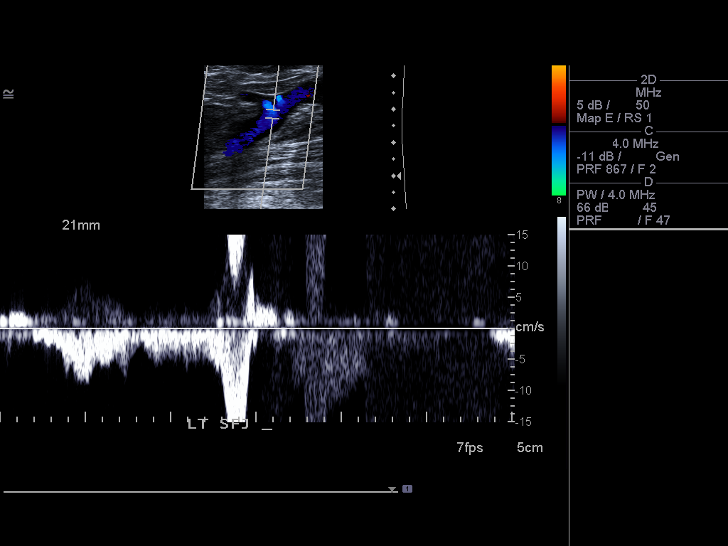
[im 8/30]
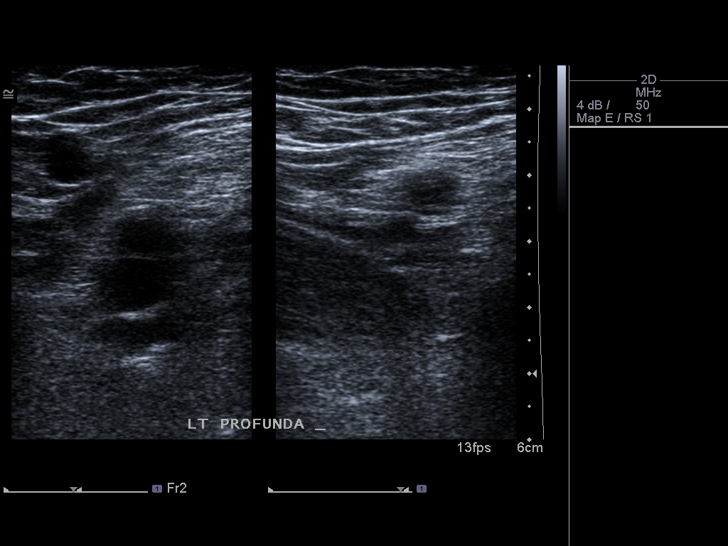
[im 9/30]
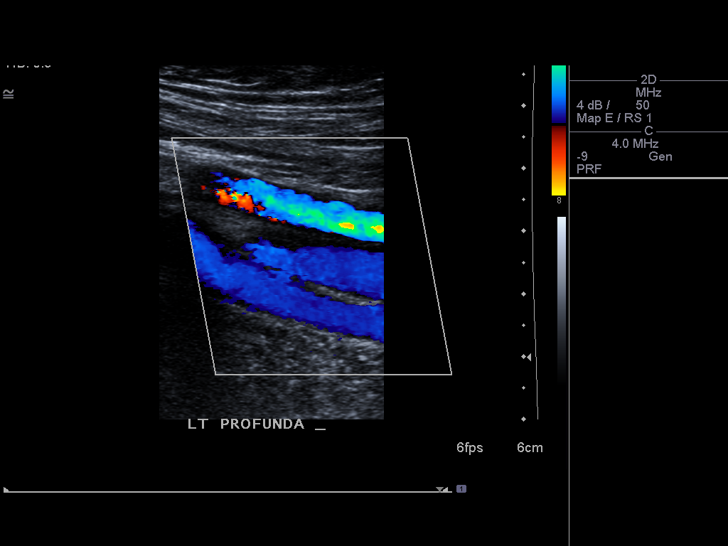
[im 12/30]
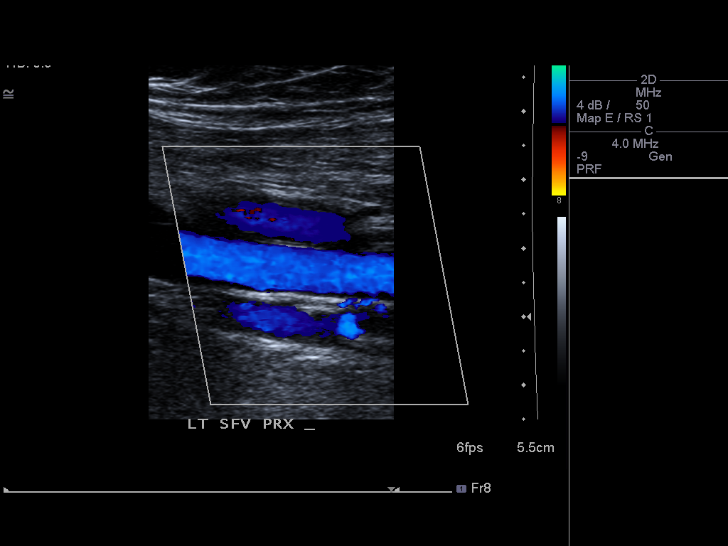
[im 14/30]
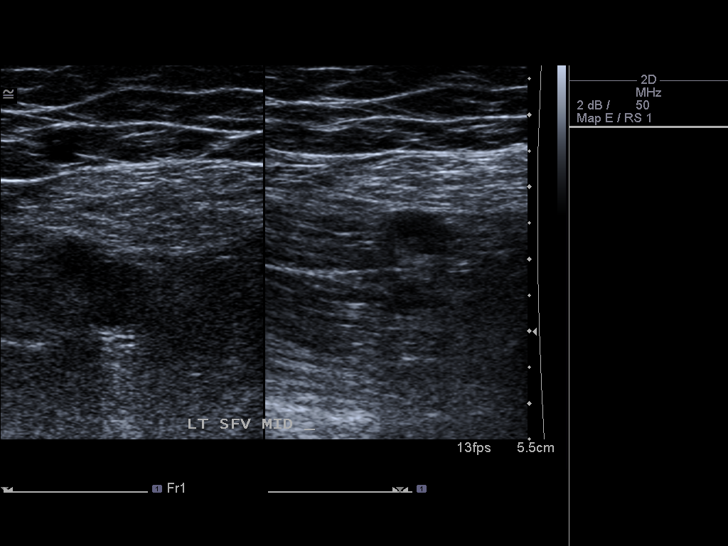
[im 16/30]
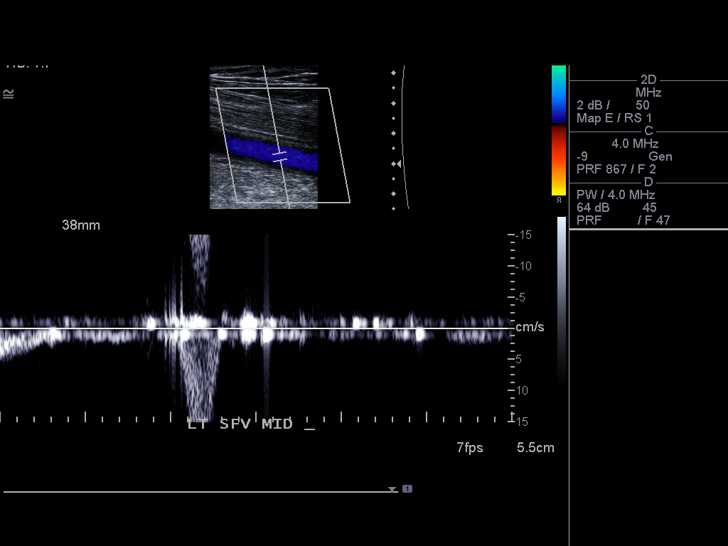
[im 18/30]
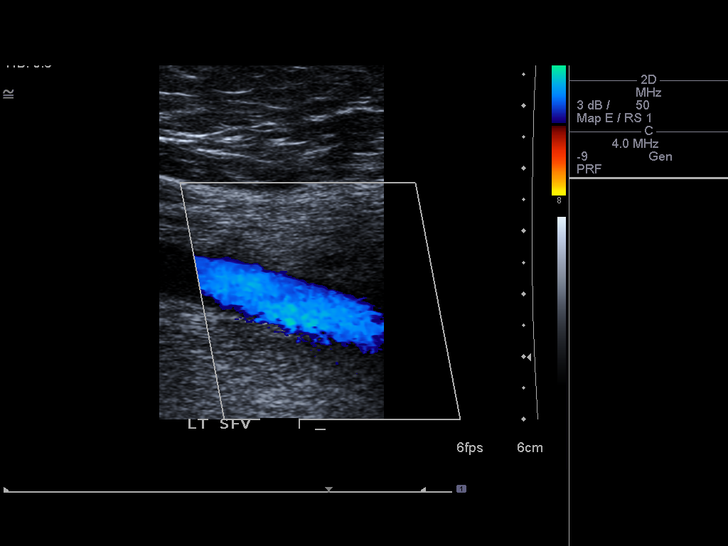
[im 21/30]
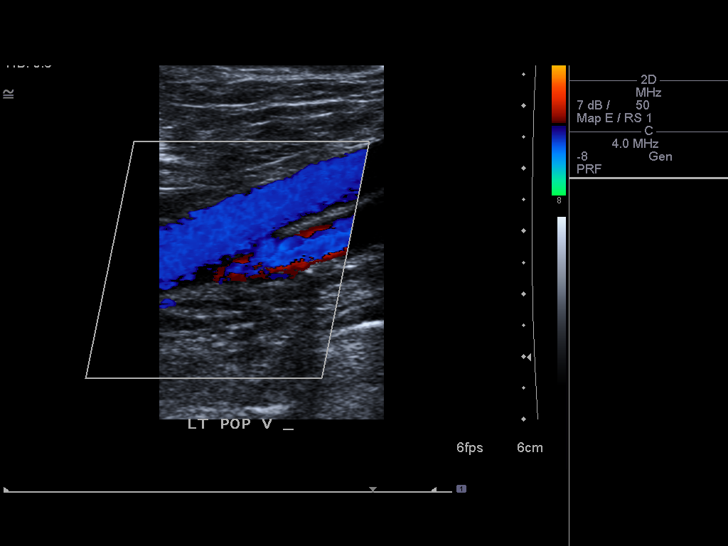
[im 23/30]
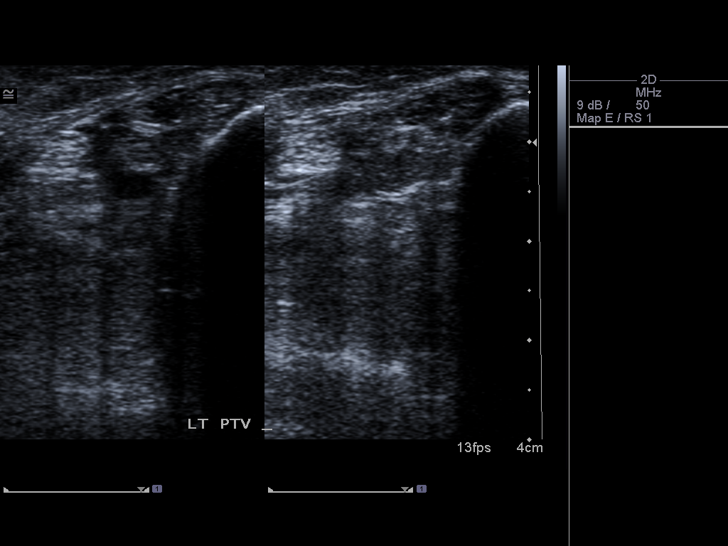
[im 24/30]
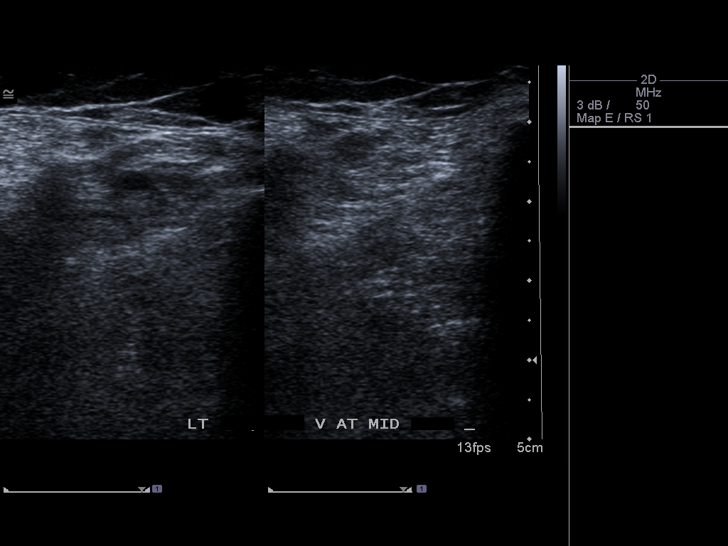
[im 27/30]
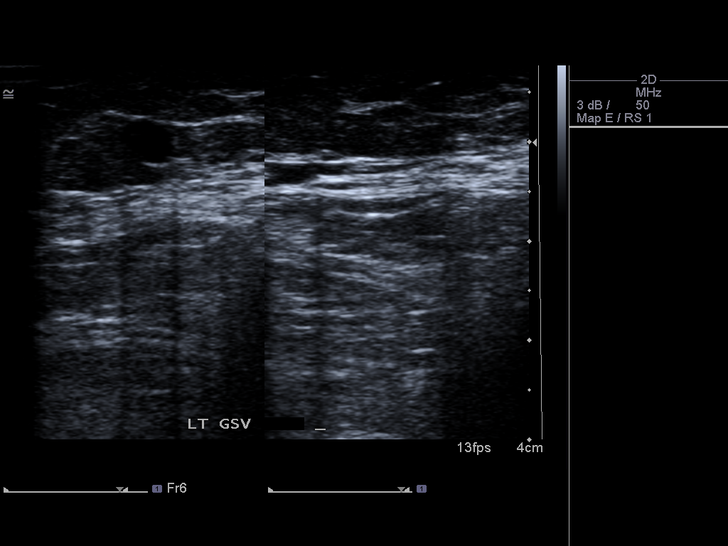
[im 30/30]
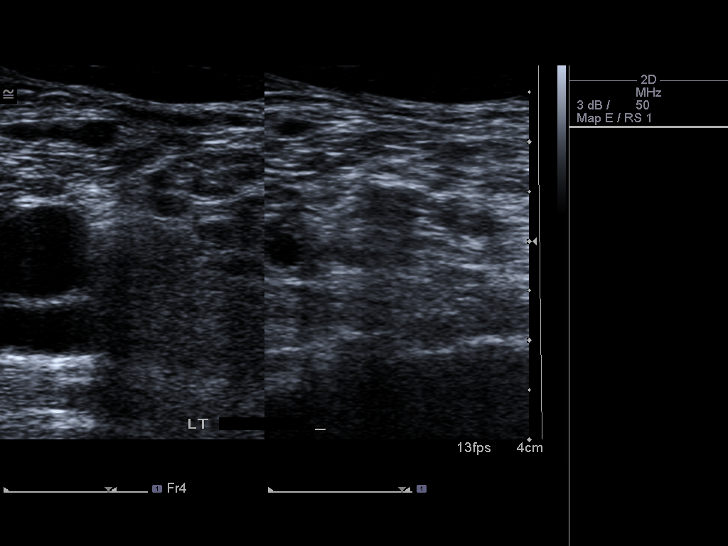

[14 of 24 positions shown; findings below may reference images not displayed]

FINDINGS: There is no evidence of left lower extremity DVT.  The
great saphenous vein is diffusely dilated.  Multiple superficial
varicosities are also identified.  No evidence of superficial
thrombophlebitis.
IMPRESSION: No evidence of DVT or superficial thrombophlebitis in the left
lower extremity.  There is evidence of superficial venous
insufficiency with a markedly dilated great saphenous vein
identified supplying patent varicosities.

## 2014-07-29 ENCOUNTER — Telehealth: Payer: Self-pay

## 2014-07-29 NOTE — Telephone Encounter (Signed)
UDS: 07/05/2014  Negative for Codeine: PRN Negative for Hydrocodone Negative for Hydromorphone Positive for Ativan Negative for Morphine  Low risk per Dr. Larose Kells 07/28/2014

## 2014-08-12 ENCOUNTER — Other Ambulatory Visit (HOSPITAL_BASED_OUTPATIENT_CLINIC_OR_DEPARTMENT_OTHER): Payer: Medicare Other

## 2014-08-12 ENCOUNTER — Other Ambulatory Visit: Payer: Self-pay | Admitting: Nurse Practitioner

## 2014-08-12 ENCOUNTER — Telehealth: Payer: Self-pay | Admitting: Internal Medicine

## 2014-08-12 ENCOUNTER — Ambulatory Visit (HOSPITAL_BASED_OUTPATIENT_CLINIC_OR_DEPARTMENT_OTHER): Payer: Medicare Other | Admitting: Hematology & Oncology

## 2014-08-12 ENCOUNTER — Encounter: Payer: Self-pay | Admitting: Hematology & Oncology

## 2014-08-12 VITALS — BP 144/58 | HR 66 | Temp 97.8°F | Resp 16 | Ht 65.0 in | Wt 167.0 lb

## 2014-08-12 DIAGNOSIS — D45 Polycythemia vera: Secondary | ICD-10-CM

## 2014-08-12 DIAGNOSIS — D751 Secondary polycythemia: Secondary | ICD-10-CM | POA: Diagnosis not present

## 2014-08-12 DIAGNOSIS — C50812 Malignant neoplasm of overlapping sites of left female breast: Secondary | ICD-10-CM

## 2014-08-12 DIAGNOSIS — C50919 Malignant neoplasm of unspecified site of unspecified female breast: Secondary | ICD-10-CM | POA: Diagnosis not present

## 2014-08-12 DIAGNOSIS — C50912 Malignant neoplasm of unspecified site of left female breast: Secondary | ICD-10-CM

## 2014-08-12 LAB — COMPREHENSIVE METABOLIC PANEL
ALT: 13 U/L (ref 0–35)
AST: 20 U/L (ref 0–37)
Albumin: 3.9 g/dL (ref 3.5–5.2)
Alkaline Phosphatase: 75 U/L (ref 39–117)
BUN: 7 mg/dL (ref 6–23)
CO2: 28 mEq/L (ref 19–32)
Calcium: 9.4 mg/dL (ref 8.4–10.5)
Chloride: 102 mEq/L (ref 96–112)
Creatinine, Ser: 0.82 mg/dL (ref 0.50–1.10)
Glucose, Bld: 101 mg/dL — ABNORMAL HIGH (ref 70–99)
Potassium: 4.1 mEq/L (ref 3.5–5.3)
Sodium: 138 mEq/L (ref 135–145)
Total Bilirubin: 1.2 mg/dL (ref 0.2–1.2)
Total Protein: 6.7 g/dL (ref 6.0–8.3)

## 2014-08-12 LAB — RETICULOCYTES (CHCC)
ABS Retic: 47.2 10*3/uL (ref 19.0–186.0)
RBC.: 5.24 MIL/uL — ABNORMAL HIGH (ref 3.87–5.11)
Retic Ct Pct: 0.9 % (ref 0.4–2.3)

## 2014-08-12 LAB — CBC WITH DIFFERENTIAL (CANCER CENTER ONLY)
BASO#: 0.1 10*3/uL (ref 0.0–0.2)
BASO%: 0.9 % (ref 0.0–2.0)
EOS%: 2.9 % (ref 0.0–7.0)
Eosinophils Absolute: 0.2 10*3/uL (ref 0.0–0.5)
HCT: 42 % (ref 34.8–46.6)
HGB: 12.8 g/dL (ref 11.6–15.9)
LYMPH#: 1.2 10*3/uL (ref 0.9–3.3)
LYMPH%: 20.2 % (ref 14.0–48.0)
MCH: 24.8 pg — ABNORMAL LOW (ref 26.0–34.0)
MCHC: 30.5 g/dL — ABNORMAL LOW (ref 32.0–36.0)
MCV: 81 fL (ref 81–101)
MONO#: 0.6 10*3/uL (ref 0.1–0.9)
MONO%: 9.5 % (ref 0.0–13.0)
NEUT#: 3.9 10*3/uL (ref 1.5–6.5)
NEUT%: 66.5 % (ref 39.6–80.0)
Platelets: 415 10*3/uL — ABNORMAL HIGH (ref 145–400)
RBC: 5.16 10*6/uL (ref 3.70–5.32)
RDW: 16.6 % — ABNORMAL HIGH (ref 11.1–15.7)
WBC: 5.8 10*3/uL (ref 3.9–10.0)

## 2014-08-12 MED ORDER — HYDROXYUREA 500 MG PO CAPS: ORAL_CAPSULE | ORAL | Status: DC

## 2014-08-12 NOTE — Progress Notes (Signed)
Hematology and Oncology Follow Up Visit  [name] [id number] 02/23/1930 81 y.o. 08/12/2014   Principle Diagnosis:   Polycythemia vera-JAK2 positive  Stage I (T1BN0M0 ) infiltrating duct carcinoma the left breast   Current Therapy:    #1 phlebotomy to maintain hematocrit below 45%.  #2 Hydrea 500 mg by mouth every other day.  #3 Femara 2.5 mg by mouth daily     Interim History:  Ms.  Parrish is back for followup. She's quite nervous today. She had a mammogram done recently. She wants to have a second opinion at [facility]. She thinks that the report, which I saw, showed that there was concern about cancer. I told her that the report had nothing on it they said cancer. She had a benign area in her right breast. Somehow, she thought that benign med cancer.  She does have dense breast. As such, she may benefit from an MRI.  She has had no problem with the polycythemia. We've not had to phlebotomize her for several months. She is iron deficient. We last saw her back in general, her ferritin was 25 with an iron saturation of 7%.  She has had no rashes.  Her appetite is doing okay.  Overall, her performance status is ECOG 1.    Medications:  Current outpatient prescriptions:  .  acetaminophen-codeine (TYLENOL #3) 300-30 MG per tablet, Take 1 tablet by mouth every 8 (eight) hours as needed. For migraine, Disp: 120 tablet, Rfl: 0 .  aspirin 81 MG tablet, Take 81 mg by mouth daily., Disp: , Rfl:  .  Cholecalciferol (VITAMIN D) 2000 UNITS tablet, Take 2,000 Units by mouth daily., Disp: , Rfl:  .  hydrochlorothiazide (HYDRODIURIL) 25 MG tablet, Take 1 tablet (25 mg total) by mouth daily as needed (swelling in the legs )., Disp: 90 tablet, Rfl: 2 .  letrozole (FEMARA) 2.5 MG tablet, Take 1 tablet (2.5 mg total) by mouth daily., Disp: 90 tablet, Rfl: 4 .  LORazepam (ATIVAN) 0.5 MG tablet, TAKE 1 TABLET BY MOUTH TWICE DAILY AS NEEDED, Disp: 60 tablet, Rfl: 0 .  metoprolol  (LOPRESSOR) 50 MG tablet, Take 1 tablet (50 mg total) by mouth 2 (two) times daily., Disp: 60 tablet, Rfl: 6 .  omeprazole (PRILOSEC OTC) 20 MG tablet, Take 20 mg by mouth daily. , Disp: , Rfl:  .  Probiotic Product (PROBIOTIC DAILY PO), Take by mouth every morning., Disp: , Rfl:  .  simethicone (MYLICON) 024 MG chewable tablet, Chew 125 mg by mouth every 6 (six) hours as needed for flatulence., Disp: , Rfl:  .  vitamin E 400 UNIT capsule, Take 400 Units by mouth daily. , Disp: , Rfl:  .  hydroxyurea (HYDREA) 500 MG capsule, TAKE ONE CAPSULE BY MOUTH EVERY OTHER DAY, MAY TAKE WITH FOOD TO MINIMIZE SIDE EFFECTS, Disp: 90 capsule, Rfl: 3  Allergies:  Allergies  Allergen Reactions  . Atorvastatin Diarrhea    REACTION: diarrhe  . Ezetimibe     REACTION: nausea  . Fluvastatin Sodium Other (See Comments)    "Can't remember"  . Magnesium Hydroxide Other (See Comments)    REACTION: triggers HAs  . Meloxicam Other (See Comments)    Pt seeing auro's / spots.    . Pneumovax [Pneumococcal Polysaccharide Vaccine]     Local reaction  . Quinapril Hcl Other (See Comments)    03-29-11 "feelings of tiredness"  . Simvastatin Diarrhea  . Topamax [Topiramate] Itching  . Valsartan Itching and Other (See Comments)  REACTION: toes itch  . Vit D-Vit E-Safflower Oil Other (See Comments)    Headaches  . Carvedilol     "teeth hurt when I took it"  . Penicillins Other (See Comments) and Rash    Other Reaction: OTHER REACTION Mouth blisters 03-29-11    Past Medical History, Surgical history, Social history, and Family History were reviewed and updated.  Review of Systems: As above  Physical Exam:  height is 5\' 5"  (1.651 m) and weight is 167 lb (75.751 kg). Her oral temperature is 97.8 F (36.6 C). Her blood pressure is 144/58 and her pulse is 66. Her respiration is 16.   Well-developed and well-nourished white female. Head and neck exam shows no ocular or oral lesions. She has no palpable cervical  or supraclavicular lymph nodes. Lungs are clear bilaterally. Cardiac exam regular rate and rhythm with a normal S1 and S2. There are no murmurs rubs or bruits. Breast exam shows right breast with no masses or edema or erythema. There is no right axillary adenopathy. Left breast shows well-healed lumpectomy at the 2:00 position. She has no t obvious mass in the left breast. There is some slight contraction from radiation. She does not have any obvious left axillary adenopathy. Abdomen is soft. Has good bowel sounds. There is no palpable abdominal mass. There is no palpable liver or spleen tip. Neck exam shows no kyphosis. There is no tenderness over the spine ribs or hips. Extremities shows no lymphedema. She has no joint swelling. She's good range most of her joints. Skin exam no rashes. Neurological exam shows no focal neurological deficits.  Lab Results  Component Value Date   WBC 5.8 08/12/2014   HGB 12.8 08/12/2014   HCT 42.0 08/12/2014   MCV 81 08/12/2014   PLT 415* 08/12/2014     Chemistry      Component Value Date/Time   NA 136 05/25/2014 1026   NA 138 06/25/2013 1356   K 4.3 05/25/2014 1026   K 4.4 06/25/2013 1356   CL 100 05/25/2014 1026   CL 101 06/25/2013 1356   CO2 29 05/25/2014 1026   CO2 32 06/25/2013 1356   BUN 9 05/25/2014 1026   BUN 8 06/25/2013 1356   CREATININE 0.72 05/25/2014 1026   CREATININE 0.7 06/25/2013 1356      Component Value Date/Time   CALCIUM 9.3 05/25/2014 1026   CALCIUM 9.3 06/25/2013 1356   ALKPHOS 83 05/25/2014 1026   ALKPHOS 85* 06/25/2013 1356   AST 15 05/25/2014 1026   AST 22 06/25/2013 1356   ALT <8 05/25/2014 1026   ALT 13 06/25/2013 1356   BILITOT 1.2 05/25/2014 1026   BILITOT 1.10 06/25/2013 1356         Impression and Plan: [name] is a 81 year old white female with 2 separate problems. She is doing very well with respect to the polycythemia. We do not have to phlebotomize her. Again, I think this is reflective of the iron  deficiency..    As far as the breast cancer is concerned, she is doing well with this. She is on Femara.I cannot feel anything with the right breast. However, I think that we need to get an MRI. I this will give her some peace of mind.   We will continue her on the Femara.  We will plan to go back in about 3 months or so.     Volanda Napoleon, MD 3/31/20163:50 PM

## 2014-08-12 NOTE — Telephone Encounter (Signed)
FYI

## 2014-08-12 NOTE — Telephone Encounter (Signed)
Okay, I'll see her tomorrow. Thank you

## 2014-08-12 NOTE — Telephone Encounter (Signed)
[name], [place] April 02, 2030  Pt walked in and wanted to speak with Dr. Larose Kells regarding her Lorazepam rx. Pt states that it is,"no longer working like it did before." She's very "antsy and unable to sleep through the night." Patient insisted she would wait to talk to Dr. Larose Kells about trying out the "Ativan instead." After speaking with [facility] F[facility]CMA, pt was informed that she needed to schedule an appt to discuss the matter. Pt sched appt for 08-13-14 @1pm  to see Dr. Larose Kells.   Amber N[employer]

## 2014-08-13 ENCOUNTER — Ambulatory Visit (INDEPENDENT_AMBULATORY_CARE_PROVIDER_SITE_OTHER): Payer: Medicare Other | Admitting: Internal Medicine

## 2014-08-13 ENCOUNTER — Encounter: Payer: Self-pay | Admitting: Internal Medicine

## 2014-08-13 VITALS — BP 122/72 | HR 76 | Temp 98.1°F | Ht 65.0 in | Wt 166.2 lb

## 2014-08-13 DIAGNOSIS — F341 Dysthymic disorder: Secondary | ICD-10-CM

## 2014-08-13 MED ORDER — ESCITALOPRAM OXALATE 5 MG PO TABS: 5.0000 mg | ORAL_TABLET | Freq: Every day | ORAL | Status: DC

## 2014-08-13 NOTE — Progress Notes (Signed)
Pre visit review using our clinic review tool, if applicable. No additional management support is needed unless otherwise documented below in the visit note. 

## 2014-08-13 NOTE — Patient Instructions (Signed)
Start Lexapro 1 tablet at bedtime every night Continue with lorazepam twice a day, you can also take half lorazepam as needed for panic attacks  Next visit in 2 or 3 months, please make an appointment

## 2014-08-13 NOTE — Assessment & Plan Note (Signed)
Anxiety well controlled with lorazepam twice a day until recently, now having panic attacks almost daily. We discussed the role of counseling but she is not inclined to pursue that. We agreed to start Lexapro 5 mg at bedtime, hopefully that will help with anxiety. Okay to continue with lorazepam twice a day, also okay to take an extra half tablet of lorazepam as needed for panic attacks. Come back in 2 or 3 months, call sooner if this is not working for her

## 2014-08-13 NOTE — Progress Notes (Signed)
Subjective:    Patient ID: [name], female    DOB: 1930-05-03, 81 y.o.   MRN: [id number]  DOS:  08/13/2014 Type of visit - description : acute Interval history: Long history of anxiety, usually well controlled with lorazepam in the morning and 1 tablet at bedtime, in the last 3 weeks has noted episodes of what she describes as panic attacks, just gets very nervous and jittery. Episodes happen almost daily, may last an hour. Denies any physical problems per se. In general life is going well, denies any major problems at home or financial issues. Trigger seems to be staying alone for too long. Also, not sleeping as well as before, waking up sometimes at 4 AM.   Review of Systems Denies chest pain, palpitations, difficulty breathing at baseline.  Past Medical History  Diagnosis Date  . Headache(784.0)     migraines  . Hyperlipidemia   . Osteoporosis   . CVA (cerebral infarction)   . Hemorrhoids   . Hyperplastic colon polyp 06/2001  . Diverticulitis   . Varicose vein   . DVT (deep venous thrombosis)     after venous ablation, R leg  . Fracture 09/10/07    L2, status post vertebroplasty of L2 performed by IR  . Cardiomyopathy     Probable Takotsubo, severe CP w/ normal cath in 1994. Severe CP in 2003 w/ widespread T wave inversions on ECG. Cath w/ minimal coronary disease but LV-gram showed periapical severe hypokinesis and basilar hyperkinesia (EF 40%). Last echo in 4/09 confirmed full LV functional recovery with EF 60%, no regional wall motion abnormalities, mild to moderate LVH.  . OSA (obstructive sleep apnea) 03-29-11    no cpap used, not a problem now.  Marland Kitchen CAD (coronary artery disease)     mild nonobstructive disease on cath in 2003  . Hypertension   . GERD (gastroesophageal reflux disease)   . Hiatal hernia 03-29-11    no nerve problems  . Depression with anxiety 03-29-11    lost husband 3'09  . OA (osteoarthritis) of knee 03-29-11    w/ bilateral knee pain-not a  problem now  . Stroke 03-29-11    CVA x2 -last 10'12-?TIA(visual problems)  . E. coli gastroenteritis 03-29-11    8'10  . Cancer   . Polycythemia     Past Surgical History  Procedure Laterality Date  . Appendectomy    . Ovarian cyst surgery      left  . Tonsillectomy    . Back surgery    . Tubal ligation    . Dental extraction      L maxillary molar  . Kyphosis surgery  08/2007    cement used  . Total knee arthroplasty  06/2010    left  . Cholecystectomy  04/09/2011    Procedure: LAPAROSCOPIC CHOLECYSTECTOMY WITH INTRAOPERATIVE CHOLANGIOGRAM;  Surgeon: Odis Hollingshead, MD;  Location: [facility];  Service: General;  Laterality: N/A;  . Breast biopsy    . Breast cyst excision    . Breast lumpectomy      left stage I left breast cancer    History   Social History  . Marital Status: Widowed    Spouse Name: N/A  . Number of Children: 3  . Years of Education: N/A   Occupational History  . n/a    Social History Main Topics  . Smoking status: Former Smoker -- 1.00 packs/day for 37 years    Types: Cigarettes    Start date: 06/26/1951  Quit date: 05/14/1988  . Smokeless tobacco: Never Used     Comment: quit 25 years ago  . Alcohol Use: 0.6 oz/week    1 Cans of beer per week     Comment: occasional/social  . Drug Use: No  . Sexual Activity: No   Other Topics Concern  . Not on file   Social History Narrative   Lost husband 07/29/07-    Lives in [place] in a town house, her daughter moved in w/ her   Drives                     Medication List       This list is accurate as of: 08/13/14 11:59 PM.  Always use your most recent med list.               acetaminophen-codeine 300-30 MG per tablet  Commonly known as:  TYLENOL #3  Take 1 tablet by mouth every 8 (eight) hours as needed. For migraine     aspirin 81 MG tablet  Take 81 mg by mouth daily.     escitalopram 5 MG tablet  Commonly known as:  LEXAPRO  Take 1 tablet (5 mg total) by mouth  at bedtime.     hydrochlorothiazide 25 MG tablet  Commonly known as:  HYDRODIURIL  Take 1 tablet (25 mg total) by mouth daily as needed (swelling in the legs ).     hydroxyurea 500 MG capsule  Commonly known as:  HYDREA  TAKE ONE CAPSULE BY MOUTH EVERY OTHER DAY, MAY TAKE WITH FOOD TO MINIMIZE SIDE EFFECTS     letrozole 2.5 MG tablet  Commonly known as:  FEMARA  Take 1 tablet (2.5 mg total) by mouth daily.     LORazepam 0.5 MG tablet  Commonly known as:  ATIVAN  TAKE 1 TABLET BY MOUTH TWICE DAILY AS NEEDED     metoprolol 50 MG tablet  Commonly known as:  LOPRESSOR  Take 1 tablet (50 mg total) by mouth 2 (two) times daily.     omeprazole 20 MG tablet  Commonly known as:  PRILOSEC OTC  Take 20 mg by mouth daily.     PROBIOTIC DAILY PO  Take by mouth every morning.     simethicone 125 MG chewable tablet  Commonly known as:  MYLICON  Chew 229 mg by mouth every 6 (six) hours as needed for flatulence.     Vitamin D 2000 UNITS tablet  Take 2,000 Units by mouth daily.     vitamin E 400 UNIT capsule  Take 400 Units by mouth daily.           Objective:   Physical Exam BP 122/72 mmHg  Pulse 76  Temp(Src) 98.1 F (36.7 C) (Oral)  Ht 5\' 5"  (1.651 m)  Wt 166 lb 4 oz (75.411 kg)  BMI 27.67 kg/m2  SpO2 97%  General:   Well developed, well nourished . NAD.  HEENT:  Normocephalic . Face symmetric, atraumatic  Neurologic:  alert & oriented X3.  Speech normal, gait appropriate for age and unassisted Psych--  Cognition and judgment appear intact.  Cooperative with normal attention span and concentration.  Behavior appropriate. No anxious or depressed appearing.       Assessment & Plan:   Today , I spent more than  25  min with the patient: >50% of the time counseling regards  the benefits of counseling and medications such as Lexapro,  also talking about  side effects.

## 2014-08-25 ENCOUNTER — Ambulatory Visit
Admission: RE | Admit: 2014-08-25 | Discharge: 2014-08-25 | Disposition: A | Discharge status: Home / Self Care | Payer: Medicare Other | Source: Ambulatory Visit | Attending: Hematology & Oncology | Admitting: Hematology & Oncology

## 2014-08-25 DIAGNOSIS — C50912 Malignant neoplasm of unspecified site of left female breast: Secondary | ICD-10-CM

## 2014-09-01 ENCOUNTER — Other Ambulatory Visit: Payer: PRIVATE HEALTH INSURANCE

## 2014-09-30 DIAGNOSIS — Z85828 Personal history of other malignant neoplasm of skin: Secondary | ICD-10-CM | POA: Diagnosis not present

## 2014-09-30 DIAGNOSIS — L720 Epidermal cyst: Secondary | ICD-10-CM | POA: Diagnosis not present

## 2014-09-30 DIAGNOSIS — L821 Other seborrheic keratosis: Secondary | ICD-10-CM | POA: Diagnosis not present

## 2014-10-19 ENCOUNTER — Other Ambulatory Visit: Payer: Self-pay | Admitting: Hematology & Oncology

## 2014-10-19 DIAGNOSIS — D751 Secondary polycythemia: Secondary | ICD-10-CM

## 2014-10-19 DIAGNOSIS — C50912 Malignant neoplasm of unspecified site of left female breast: Secondary | ICD-10-CM

## 2014-10-20 ENCOUNTER — Other Ambulatory Visit: Payer: Self-pay | Admitting: Family Medicine

## 2014-10-20 NOTE — Telephone Encounter (Signed)
Pt is requesting refill on Lorazepam.  Last OV: 07/05/2014 Last Fill: 04/13/2014 #[id number] 0RF UDS: 07/05/2014 Low risk  Please advise.

## 2014-10-20 NOTE — Telephone Encounter (Signed)
Dr Paz patient 

## 2014-10-20 NOTE — Telephone Encounter (Signed)
Ok 60 and 2 RF

## 2014-10-20 NOTE — Telephone Encounter (Signed)
Rx printed, awaiting MD signature.  

## 2014-10-20 NOTE — Telephone Encounter (Signed)
Rx faxed to [facility] pharmacy.  

## 2014-10-22 DIAGNOSIS — H2513 Age-related nuclear cataract, bilateral: Secondary | ICD-10-CM | POA: Diagnosis not present

## 2014-10-22 DIAGNOSIS — H524 Presbyopia: Secondary | ICD-10-CM | POA: Diagnosis not present

## 2014-11-03 ENCOUNTER — Encounter: Payer: Self-pay | Admitting: Family

## 2014-11-03 ENCOUNTER — Other Ambulatory Visit (HOSPITAL_BASED_OUTPATIENT_CLINIC_OR_DEPARTMENT_OTHER): Payer: Medicare Other

## 2014-11-03 ENCOUNTER — Ambulatory Visit (HOSPITAL_BASED_OUTPATIENT_CLINIC_OR_DEPARTMENT_OTHER): Payer: Medicare Other | Admitting: Family

## 2014-11-03 ENCOUNTER — Ambulatory Visit (HOSPITAL_BASED_OUTPATIENT_CLINIC_OR_DEPARTMENT_OTHER): Payer: Medicare Other

## 2014-11-03 VITALS — BP 145/68 | HR 86 | Temp 98.2°F | Resp 16 | Ht 65.0 in | Wt 168.0 lb

## 2014-11-03 DIAGNOSIS — C50919 Malignant neoplasm of unspecified site of unspecified female breast: Secondary | ICD-10-CM | POA: Diagnosis not present

## 2014-11-03 DIAGNOSIS — D751 Secondary polycythemia: Secondary | ICD-10-CM

## 2014-11-03 DIAGNOSIS — R0602 Shortness of breath: Secondary | ICD-10-CM

## 2014-11-03 DIAGNOSIS — D45 Polycythemia vera: Secondary | ICD-10-CM

## 2014-11-03 DIAGNOSIS — C50812 Malignant neoplasm of overlapping sites of left female breast: Secondary | ICD-10-CM

## 2014-11-03 DIAGNOSIS — C50912 Malignant neoplasm of unspecified site of left female breast: Secondary | ICD-10-CM

## 2014-11-03 LAB — RETICULOCYTES (CHCC)
ABS Retic: 56.9 10*3/uL (ref 19.0–186.0)
RBC.: 5.69 MIL/uL — ABNORMAL HIGH (ref 3.87–5.11)
Retic Ct Pct: 1 % (ref 0.4–2.3)

## 2014-11-03 LAB — CBC WITH DIFFERENTIAL (CANCER CENTER ONLY)
BASO#: 0 10*3/uL (ref 0.0–0.2)
BASO%: 0.7 % (ref 0.0–2.0)
EOS%: 2.3 % (ref 0.0–7.0)
Eosinophils Absolute: 0.1 10*3/uL (ref 0.0–0.5)
HCT: 44.7 % (ref 34.8–46.6)
HGB: 14.1 g/dL (ref 11.6–15.9)
LYMPH#: 1 10*3/uL (ref 0.9–3.3)
LYMPH%: 17.3 % (ref 14.0–48.0)
MCH: 25 pg — ABNORMAL LOW (ref 26.0–34.0)
MCHC: 31.5 g/dL — ABNORMAL LOW (ref 32.0–36.0)
MCV: 79 fL — ABNORMAL LOW (ref 81–101)
MONO#: 0.6 10*3/uL (ref 0.1–0.9)
MONO%: 10 % (ref 0.0–13.0)
NEUT#: 4.2 10*3/uL (ref 1.5–6.5)
NEUT%: 69.7 % (ref 39.6–80.0)
Platelets: 459 10*3/uL — ABNORMAL HIGH (ref 145–400)
RBC: 5.63 10*6/uL — ABNORMAL HIGH (ref 3.70–5.32)
RDW: 16.1 % — ABNORMAL HIGH (ref 11.1–15.7)
WBC: 6 10*3/uL (ref 3.9–10.0)

## 2014-11-03 LAB — FERRITIN CHCC: Ferritin: 28 ng/ml (ref 9–269)

## 2014-11-03 LAB — COMPREHENSIVE METABOLIC PANEL
ALT: 13 U/L (ref 0–35)
AST: 19 U/L (ref 0–37)
Albumin: 4.3 g/dL (ref 3.5–5.2)
Alkaline Phosphatase: 85 U/L (ref 39–117)
BUN: 13 mg/dL (ref 6–23)
CO2: 25 mEq/L (ref 19–32)
Calcium: 9.7 mg/dL (ref 8.4–10.5)
Chloride: 100 mEq/L (ref 96–112)
Creatinine, Ser: 0.85 mg/dL (ref 0.50–1.10)
Glucose, Bld: 105 mg/dL — ABNORMAL HIGH (ref 70–99)
Potassium: 4.4 mEq/L (ref 3.5–5.3)
Sodium: 137 mEq/L (ref 135–145)
Total Bilirubin: 1.1 mg/dL (ref 0.2–1.2)
Total Protein: 7.4 g/dL (ref 6.0–8.3)

## 2014-11-03 LAB — IRON AND TIBC CHCC
%SAT: 6 % — ABNORMAL LOW (ref 21–57)
Iron: 26 ug/dL — ABNORMAL LOW (ref 41–142)
TIBC: 438 ug/dL (ref 236–444)
UIBC: 412 ug/dL — ABNORMAL HIGH (ref 120–384)

## 2014-11-03 LAB — CHCC SATELLITE - SMEAR

## 2014-11-03 NOTE — Patient Instructions (Signed)
Therapeutic Phlebotomy Therapeutic phlebotomy is the controlled removal of blood from your body for the purpose of treating a medical condition. It is similar to donating blood. Usually, about a pint (470 mL) of blood is removed. The average adult has 9 to 12 pints (4.3 to 5.7 L) of blood. Therapeutic phlebotomy may be used to treat the following medical conditions:  Hemochromatosis. This is a condition in which there is too much iron in the blood.  Polycythemia vera. This is a condition in which there are too many red cells in the blood.  Porphyria cutanea tarda. This is a disease usually passed from one generation to the next (inherited). It is a condition in which an important part of hemoglobin is not made properly. This results in the build up of abnormal amounts of porphyrins in the body.  Sickle cell disease. This is an inherited disease. It is a condition in which the red blood cells form an abnormal crescent shape rather than a round shape. LET YOUR CAREGIVER KNOW ABOUT:  Allergies.  Medicines taken including herbs, eyedrops, over-the-counter medicines, and creams.  Use of steroids (by mouth or creams).  Previous problems with anesthetics or numbing medicine.  History of blood clots.  History of bleeding or blood problems.  Previous surgery.  Possibility of pregnancy, if this applies. RISKS AND COMPLICATIONS This is a simple and safe procedure. Problems are unlikely. However, problems can occur and may include:  Nausea or lightheadedness.  Low blood pressure.  Soreness, bleeding, swelling, or bruising at the needle insertion site.  Infection. BEFORE THE PROCEDURE  This is a procedure that can be done as an outpatient. Confirm the time that you need to arrive for your procedure. Confirm whether there is a need to fast or withhold any medications. It is helpful to wear clothing with sleeves that can be raised above the elbow. A blood sample may be done to determine the  amount of red blood cells or iron in your blood. Plan ahead of time to have someone drive you home after the procedure. PROCEDURE The entire procedure from preparation through recovery takes about 1 hour. The actual collection takes about 10 to 15 minutes.  A needle will be inserted into your vein.  Tubing and a collection bag will be attached to that needle.  Blood will flow through the needle and tubing into the collection bag.  You may be asked to open and close your hand slowly and continuously during the entire collection.  Once the specified amount of blood has been removed from your body, the collection bag and tubing will be clamped.  The needle will be removed.  Pressure will be held on the site of the needle insertion to stop the bleeding. Then a bandage will be placed over the needle insertion site. AFTER THE PROCEDURE  Your recovery will be assessed and monitored. If there are no problems, as an outpatient, you should be able to go home shortly after the procedure.  Document Released: 10/02/2010 Document Revised: 07/23/2011 Document Reviewed: 10/02/2010 [employer] Patient Information 2015 [employer]. This information is not intended to replace advice given to you by your health care provider. Make sure you discuss any questions you have with your health care provider.  

## 2014-11-03 NOTE — Progress Notes (Signed)
Hematology and Oncology Follow Up Visit  [name] [id number] 05/21/1929 81 y.o. 11/03/2014   Principle Diagnosis:  Polycythemia vera-JAK2 positive Stage I (T1BN0M0 ) infiltrating duct carcinoma the left breast - diagnosed May 2013  Current Therapy:   1. Phlebotomy to maintain hematocrit below 45%. 2. Hydrea 500 mg by mouth every other day 3. Femara 2.5 mg by mouth daily - started in 2013    Interim History:  [name] is here today for a follow-up. She is feeling very tired. Her Hct today is 44.7.  She also c/o left sided neck pain that comes and goes. I was able to palpate 1 cervical lymph node on the left. It was not painful and is mobile. We will continue to watch this.  She has had no problem with infections. No fever, chills, n/v, cough, rash, dizziness, chest pain, palpitations, abdominal pain, diarrhea, blood in urine or stool. She has constipation that is resolved with stool softeners and pineapple juice.  She is taking Hydrea every other day. Her platelet count today is 459.  No episodes of bruising or bleeding.  She still has the occasional aura but no longer has migraines. Her aura consists of seeing "W's" and "Z's" floating everywhere. She does know to be mindful of signs/symptoms and call EMS if she thinks she may be having a stroke.  There has been no changes with her chest. Her mammogram last November showed no evidence of malignancy. She is scheduled to have a breast MRI on 7/7.  She does have SOB with exertion. This is exacerbated with the heat outside. She is trying to stay active walking her dogs. She is still doing water aerobics as well.  No swelling, tenderness, numbness or tingling in her extremities.  She is eating well and staying hydrated. Her weight is stable.   Medications:    Medication List       This list is accurate as of: 11/03/14 10:24 AM.  Always use your most recent med list.               acetaminophen-codeine 300-30 MG per tablet    Commonly known as:  TYLENOL #3  Take 1 tablet by mouth every 8 (eight) hours as needed. For migraine     aspirin 81 MG tablet  Take 81 mg by mouth daily.     escitalopram 5 MG tablet  Commonly known as:  LEXAPRO  Take 1 tablet (5 mg total) by mouth at bedtime.     hydrochlorothiazide 25 MG tablet  Commonly known as:  HYDRODIURIL  Take 1 tablet (25 mg total) by mouth daily as needed (swelling in the legs ).     hydroxyurea 500 MG capsule  Commonly known as:  HYDREA  TAKE ONE CAPSULE BY MOUTH EVERY OTHER DAY, MAY TAKE WITH FOOD TO MINIMIZE SIDE EFFECTS     letrozole 2.5 MG tablet  Commonly known as:  FEMARA  Take 1 tablet (2.5 mg total) by mouth daily.     LORazepam 0.5 MG tablet  Commonly known as:  ATIVAN  Take 1 tablet (0.5 mg total) by mouth 2 (two) times daily as needed.     metoprolol 50 MG tablet  Commonly known as:  LOPRESSOR  Take 1 tablet (50 mg total) by mouth 2 (two) times daily.     omeprazole 20 MG tablet  Commonly known as:  PRILOSEC OTC  Take 20 mg by mouth daily.     PROBIOTIC DAILY PO  Take by mouth  every morning.     simethicone 125 MG chewable tablet  Commonly known as:  MYLICON  Chew 283 mg by mouth every 6 (six) hours as needed for flatulence.     Vitamin D 2000 UNITS tablet  Take 2,000 Units by mouth daily.     vitamin E 400 UNIT capsule  Take 400 Units by mouth daily.        Allergies:  Allergies  Allergen Reactions  . Atorvastatin Diarrhea    REACTION: diarrhe  . Ezetimibe     REACTION: nausea  . Fluvastatin Sodium Other (See Comments)    "Can't remember"  . Magnesium Hydroxide Other (See Comments)    REACTION: triggers HAs  . Meloxicam Other (See Comments)    Pt seeing auro's / spots.    . Pneumovax [Pneumococcal Polysaccharide Vaccine]     Local reaction  . Quinapril Hcl Other (See Comments)    03-29-11 "feelings of tiredness"  . Simvastatin Diarrhea  . Topamax [Topiramate] Itching  . Valsartan Itching and Other (See  Comments)    REACTION: toes itch  . Vit D-Vit E-Safflower Oil Other (See Comments)    Headaches  . Carvedilol     "teeth hurt when I took it"  . Penicillins Other (See Comments) and Rash    Other Reaction: OTHER REACTION Mouth blisters 03-29-11    Past Medical History, Surgical history, Social history, and Family History were reviewed and updated.  Review of Systems: All other 10 point review of systems is negative.   Physical Exam:  height is 5\' 5"  (1.651 m) and weight is 168 lb (76.204 kg). Her oral temperature is 98.2 F (36.8 C). Her blood pressure is 145/68 and her pulse is 86. Her respiration is 16.   Wt Readings from Last 3 Encounters:  11/03/14 168 lb (76.204 kg)  08/13/14 166 lb 4 oz (75.411 kg)  08/12/14 167 lb (75.751 kg)    Ocular: Sclerae unicteric, pupils equal, round and reactive to light Ear-nose-throat: Oropharynx clear, dentition fair Lymphatic: No cervical or supraclavicular adenopathy Lungs no rales or rhonchi, good excursion bilaterally Heart regular rate and rhythm, no murmur appreciated Abd soft, nontender, positive bowel sounds MSK no focal spinal tenderness, no joint edema Neuro: non-focal, well-oriented, appropriate affect Breasts: No changes. No mass, lesion, rash or lymphadenopathy.   Lab Results  Component Value Date   WBC 5.8 08/12/2014   HGB 12.8 08/12/2014   HCT 42.0 08/12/2014   MCV 81 08/12/2014   PLT 415* 08/12/2014   Lab Results  Component Value Date   FERRITIN 25 05/25/2014   IRON 31* 05/25/2014   TIBC 420 05/25/2014   UIBC 389* 05/25/2014   IRONPCTSAT 7* 05/25/2014   Lab Results  Component Value Date   RETICCTPCT 0.9 08/12/2014   RBC 5.24* 08/12/2014   RETICCTABS 47.2 08/12/2014   No results found for: KPAFRELGTCHN, LAMBDASER, KAPLAMBRATIO No results found for: IGGSERUM, IGA, IGMSERUM No results found for: Odetta Pink, SPEI   Chemistry      Component Value  Date/Time   NA 138 08/12/2014 1025   NA 138 06/25/2013 1356   K 4.1 08/12/2014 1025   K 4.4 06/25/2013 1356   CL 102 08/12/2014 1025   CL 101 06/25/2013 1356   CO2 28 08/12/2014 1025   CO2 32 06/25/2013 1356   BUN 7 08/12/2014 1025   BUN 8 06/25/2013 1356   CREATININE 0.82 08/12/2014 1025   CREATININE 0.7 06/25/2013 1356  Component Value Date/Time   CALCIUM 9.4 08/12/2014 1025   CALCIUM 9.3 06/25/2013 1356   ALKPHOS 75 08/12/2014 1025   ALKPHOS 85* 06/25/2013 1356   AST 20 08/12/2014 1025   AST 22 06/25/2013 1356   ALT 13 08/12/2014 1025   ALT 13 06/25/2013 1356   BILITOT 1.2 08/12/2014 1025   BILITOT 1.10 06/25/2013 1356     Impression and Plan: [name] is a 81 year old white female with polycythemia. She is symptomatic today with fatigue and SOB with exertion.  Her Hct is 44.7. We will phlebotomize her today.  She will continue taking her same dose of Hydrea.  She has had no changes with her chest. She is still taking Femara daily. She has been on this for 3 years now.  There has been no evidence of recurrence. She is scheduled to have a breast MRI in July.  We will continue to monitor the cervical lymph node on the left. If this is still palpable at her next visit we will get a scan.  We will plan to see her back in 3 months for labs and follow-up.  She knows to call here with any questions or concerns. We can certainly see her sooner if need be.    Eliezer Bottom, NP 6/22/201610:24 AM

## 2014-11-08 ENCOUNTER — Ambulatory Visit: Payer: PRIVATE HEALTH INSURANCE | Admitting: Internal Medicine

## 2014-11-12 ENCOUNTER — Encounter: Payer: Self-pay | Admitting: Internal Medicine

## 2014-11-12 ENCOUNTER — Other Ambulatory Visit: Payer: Self-pay | Admitting: Internal Medicine

## 2014-11-12 ENCOUNTER — Ambulatory Visit (INDEPENDENT_AMBULATORY_CARE_PROVIDER_SITE_OTHER): Payer: Medicare Other | Admitting: Internal Medicine

## 2014-11-12 VITALS — BP 124/82 | HR 84 | Temp 98.2°F | Ht 65.0 in | Wt 168.5 lb

## 2014-11-12 DIAGNOSIS — F341 Dysthymic disorder: Secondary | ICD-10-CM

## 2014-11-12 MED ORDER — METOPROLOL TARTRATE 50 MG PO TABS: 50.0000 mg | ORAL_TABLET | Freq: Two times a day (BID) | ORAL | Status: DC

## 2014-11-12 NOTE — Progress Notes (Signed)
Subjective:    Patient ID: [name], female    DOB: 1929-05-31, 82 y.o.   MRN: [id number]  DOS:  11/12/2014 Type of visit - description : Follow-up from previous visit Interval history: She was seen with anxiety, never started Lexapro, interestingly she discontinue  clindamycin and she felt back to normal, thinks clindamycin was the culprit of anxiety.  Also, several weeks history of ear ache, usually at night, wakes her, currently on the right or left side. Likes to see ENT.   Review of Systems  Denies runny nose, sore throat. No sinus pain or congestion. No ear discharge Hearing is at baseline. Denies unusual headaches or weight loss Continue feeling fatigue but denies chest pain, lower extremity edema  Past Medical History  Diagnosis Date  . Headache(784.0)     migraines  . Hyperlipidemia   . Osteoporosis   . CVA (cerebral infarction)   . Hemorrhoids   . Hyperplastic colon polyp 06/2001  . Diverticulitis   . Varicose vein   . DVT (deep venous thrombosis)     after venous ablation, R leg  . Fracture 09/10/07    L2, status post vertebroplasty of L2 performed by IR  . Cardiomyopathy     Probable Takotsubo, severe CP w/ normal cath in 1994. Severe CP in 2003 w/ widespread T wave inversions on ECG. Cath w/ minimal coronary disease but LV-gram showed periapical severe hypokinesis and basilar hyperkinesia (EF 40%). Last echo in 4/09 confirmed full LV functional recovery with EF 60%, no regional wall motion abnormalities, mild to moderate LVH.  . OSA (obstructive sleep apnea) 03-29-11    no cpap used, not a problem now.  Marland Kitchen CAD (coronary artery disease)     mild nonobstructive disease on cath in 2003  . Hypertension   . GERD (gastroesophageal reflux disease)   . Hiatal hernia 03-29-11    no nerve problems  . Depression with anxiety 03-29-11    lost husband 3'09  . OA (osteoarthritis) of knee 03-29-11    w/ bilateral knee pain-not a problem now  . Stroke 03-29-11   CVA x2 -last 10'12-?TIA(visual problems)  . E. coli gastroenteritis 03-29-11    8'10  . Cancer   . Polycythemia     Past Surgical History  Procedure Laterality Date  . Appendectomy    . Ovarian cyst surgery      left  . Tonsillectomy    . Back surgery    . Tubal ligation    . Dental extraction      L maxillary molar  . Kyphosis surgery  08/2007    cement used  . Total knee arthroplasty  06/2010    left  . Cholecystectomy  04/09/2011    Procedure: LAPAROSCOPIC CHOLECYSTECTOMY WITH INTRAOPERATIVE CHOLANGIOGRAM;  Surgeon: Odis Hollingshead, MD;  Location: [facility];  Service: General;  Laterality: N/A;  . Breast biopsy    . Breast cyst excision    . Breast lumpectomy      left stage I left breast cancer    History   Social History  . Marital Status: Widowed    Spouse Name: N/A  . Number of Children: 3  . Years of Education: N/A   Occupational History  . n/a    Social History Main Topics  . Smoking status: Former Smoker -- 1.00 packs/day for 37 years    Types: Cigarettes    Start date: 06/26/1951    Quit date: 05/14/1988  . Smokeless tobacco: Never Used  Comment: quit 25 years ago  . Alcohol Use: 0.6 oz/week    1 Cans of beer per week     Comment: occasional/social  . Drug Use: No  . Sexual Activity: No   Other Topics Concern  . Not on file   Social History Narrative   Lost husband 07/29/07-    Lives in [place] in a town house, her daughter moved in w/ her   Drives                     Medication List       This list is accurate as of: 11/12/14 11:59 PM.  Always use your most recent med list.               acetaminophen-codeine 300-30 MG per tablet  Commonly known as:  TYLENOL #3  Take 1 tablet by mouth every 8 (eight) hours as needed. For migraine     aspirin 81 MG tablet  Take 81 mg by mouth daily.     hydrochlorothiazide 25 MG tablet  Commonly known as:  HYDRODIURIL  Take 1 tablet (25 mg total) by mouth daily as needed  (swelling in the legs ).     hydroxyurea 500 MG capsule  Commonly known as:  HYDREA  TAKE ONE CAPSULE BY MOUTH EVERY OTHER DAY, MAY TAKE WITH FOOD TO MINIMIZE SIDE EFFECTS     letrozole 2.5 MG tablet  Commonly known as:  FEMARA  Take 1 tablet (2.5 mg total) by mouth daily.     LORazepam 0.5 MG tablet  Commonly known as:  ATIVAN  Take 1 tablet (0.5 mg total) by mouth 2 (two) times daily as needed.     metoprolol 50 MG tablet  Commonly known as:  LOPRESSOR  Take 1 tablet (50 mg total) by mouth 2 (two) times daily.     omeprazole 20 MG tablet  Commonly known as:  PRILOSEC OTC  Take 20 mg by mouth daily.     PROBIOTIC DAILY PO  Take by mouth every morning.     simethicone 125 MG chewable tablet  Commonly known as:  MYLICON  Chew 765 mg by mouth every 6 (six) hours as needed for flatulence.     Vitamin D 2000 UNITS tablet  Take 2,000 Units by mouth daily.     vitamin E 400 UNIT capsule  Take 400 Units by mouth daily.           Objective:   Physical Exam BP 124/82 mmHg  Pulse 84  Temp(Src) 98.2 F (36.8 C) (Oral)  Ht 5\' 5"  (1.651 m)  Wt 168 lb 8 oz (76.431 kg)  BMI 28.04 kg/m2  SpO2 95% General:   Well developed, well nourished . NAD.  HEENT:  Normocephalic . Face symmetric, atraumatic Tympanic membranes normal, TMJ no click Lungs:  CTA B Normal respiratory effort, no intercostal retractions, no accessory muscle use. Heart: RRR,  no murmur.  No pretibial edema bilaterally  Skin: Not pale. Not jaundice Neurologic:  alert & oriented X3.  Speech normal, gait appropriate for age and unassisted Psych--  Cognition and judgment appear intact.  Cooperative with normal attention span and concentration.  Behavior appropriate. No anxious or depressed appearing.     Assessment & Plan:    Otalgia, unclear etiology, exam is benign, likes to see ENT, phone number provided, states she will call and make appointment. Recommend to call us if referral is needed

## 2014-11-12 NOTE — Progress Notes (Signed)
Pre visit review using our clinic review tool, if applicable. No additional management support is needed unless otherwise documented below in the visit note. 

## 2014-11-12 NOTE — Assessment & Plan Note (Signed)
Since the last time she was here, symptoms resolved after she stopped clindamycin and she thinks there is a relationship. Also, never get to try Lexapro due to cost. At this point, symptoms are well-controlled, no need to start Lexapro

## 2014-11-18 ENCOUNTER — Ambulatory Visit
Admission: RE | Admit: 2014-11-18 | Discharge: 2014-11-18 | Disposition: A | Discharge status: Home / Self Care | Payer: Medicare Other | Source: Ambulatory Visit | Attending: Hematology & Oncology | Admitting: Hematology & Oncology

## 2014-11-18 DIAGNOSIS — C50912 Malignant neoplasm of unspecified site of left female breast: Secondary | ICD-10-CM

## 2014-11-18 DIAGNOSIS — D751 Secondary polycythemia: Secondary | ICD-10-CM

## 2014-11-18 DIAGNOSIS — Z853 Personal history of malignant neoplasm of breast: Secondary | ICD-10-CM | POA: Diagnosis not present

## 2014-11-18 DIAGNOSIS — R928 Other abnormal and inconclusive findings on diagnostic imaging of breast: Secondary | ICD-10-CM | POA: Diagnosis not present

## 2014-11-29 ENCOUNTER — Ambulatory Visit: Payer: PRIVATE HEALTH INSURANCE | Admitting: Internal Medicine

## 2014-12-09 ENCOUNTER — Encounter: Payer: Self-pay | Admitting: Gastroenterology

## 2014-12-16 DIAGNOSIS — H9203 Otalgia, bilateral: Secondary | ICD-10-CM | POA: Diagnosis not present

## 2014-12-16 DIAGNOSIS — L299 Pruritus, unspecified: Secondary | ICD-10-CM | POA: Diagnosis not present

## 2014-12-26 ENCOUNTER — Other Ambulatory Visit: Payer: Self-pay | Admitting: Internal Medicine

## 2015-01-10 ENCOUNTER — Other Ambulatory Visit: Payer: Self-pay | Admitting: *Deleted

## 2015-01-10 DIAGNOSIS — D751 Secondary polycythemia: Secondary | ICD-10-CM

## 2015-01-10 DIAGNOSIS — C50911 Malignant neoplasm of unspecified site of right female breast: Secondary | ICD-10-CM

## 2015-01-10 MED ORDER — LETROZOLE 2.5 MG PO TABS: 2.5000 mg | ORAL_TABLET | Freq: Every day | ORAL | Status: DC

## 2015-01-12 ENCOUNTER — Telehealth: Payer: Self-pay

## 2015-01-12 MED ORDER — LORAZEPAM 0.5 MG PO TABS: 0.5000 mg | ORAL_TABLET | Freq: Two times a day (BID) | ORAL | Status: DC | PRN

## 2015-01-12 NOTE — Telephone Encounter (Signed)
Okay #60 and one refill 

## 2015-01-12 NOTE — Telephone Encounter (Signed)
Rx faxed to [facility] pharmacy.  

## 2015-01-12 NOTE — Telephone Encounter (Signed)
Pt is requesting refill on Lorazepam.  Last OV: 11/12/2014 Last Fill: 10/20/2014 #60 2RF UDS: 07/05/2014 Low risk  Please advise.

## 2015-01-12 NOTE — Telephone Encounter (Signed)
Rx printed, awaiting MD signature.  

## 2015-01-20 ENCOUNTER — Other Ambulatory Visit: Payer: Self-pay

## 2015-01-20 ENCOUNTER — Emergency Department (HOSPITAL_BASED_OUTPATIENT_CLINIC_OR_DEPARTMENT_OTHER)
Admission: EM | Admit: 2015-01-20 | Discharge: 2015-01-20 | Disposition: A | Discharge status: Home / Self Care | Payer: Medicare Other | Attending: Emergency Medicine | Admitting: Emergency Medicine

## 2015-01-20 ENCOUNTER — Telehealth: Payer: Self-pay | Admitting: Internal Medicine

## 2015-01-20 ENCOUNTER — Encounter (HOSPITAL_BASED_OUTPATIENT_CLINIC_OR_DEPARTMENT_OTHER): Payer: Self-pay | Admitting: Radiology

## 2015-01-20 ENCOUNTER — Encounter: Payer: Self-pay | Admitting: Internal Medicine

## 2015-01-20 ENCOUNTER — Emergency Department (HOSPITAL_BASED_OUTPATIENT_CLINIC_OR_DEPARTMENT_OTHER): Payer: Medicare Other

## 2015-01-20 DIAGNOSIS — Z880 Allergy status to penicillin: Secondary | ICD-10-CM | POA: Insufficient documentation

## 2015-01-20 DIAGNOSIS — M1990 Unspecified osteoarthritis, unspecified site: Secondary | ICD-10-CM | POA: Insufficient documentation

## 2015-01-20 DIAGNOSIS — F419 Anxiety disorder, unspecified: Secondary | ICD-10-CM | POA: Insufficient documentation

## 2015-01-20 DIAGNOSIS — Z86018 Personal history of other benign neoplasm: Secondary | ICD-10-CM | POA: Insufficient documentation

## 2015-01-20 DIAGNOSIS — Z86718 Personal history of other venous thrombosis and embolism: Secondary | ICD-10-CM | POA: Diagnosis not present

## 2015-01-20 DIAGNOSIS — K219 Gastro-esophageal reflux disease without esophagitis: Secondary | ICD-10-CM | POA: Insufficient documentation

## 2015-01-20 DIAGNOSIS — I2510 Atherosclerotic heart disease of native coronary artery without angina pectoris: Secondary | ICD-10-CM | POA: Diagnosis not present

## 2015-01-20 DIAGNOSIS — G43909 Migraine, unspecified, not intractable, without status migrainosus: Secondary | ICD-10-CM | POA: Insufficient documentation

## 2015-01-20 DIAGNOSIS — Z7982 Long term (current) use of aspirin: Secondary | ICD-10-CM | POA: Diagnosis not present

## 2015-01-20 DIAGNOSIS — Z79899 Other long term (current) drug therapy: Secondary | ICD-10-CM | POA: Diagnosis not present

## 2015-01-20 DIAGNOSIS — Z8781 Personal history of (healed) traumatic fracture: Secondary | ICD-10-CM | POA: Diagnosis not present

## 2015-01-20 DIAGNOSIS — I10 Essential (primary) hypertension: Secondary | ICD-10-CM | POA: Diagnosis not present

## 2015-01-20 DIAGNOSIS — Z862 Personal history of diseases of the blood and blood-forming organs and certain disorders involving the immune mechanism: Secondary | ICD-10-CM | POA: Diagnosis not present

## 2015-01-20 DIAGNOSIS — R0600 Dyspnea, unspecified: Secondary | ICD-10-CM | POA: Diagnosis not present

## 2015-01-20 DIAGNOSIS — Z8673 Personal history of transient ischemic attack (TIA), and cerebral infarction without residual deficits: Secondary | ICD-10-CM | POA: Insufficient documentation

## 2015-01-20 DIAGNOSIS — R0789 Other chest pain: Secondary | ICD-10-CM | POA: Diagnosis not present

## 2015-01-20 DIAGNOSIS — F329 Major depressive disorder, single episode, unspecified: Secondary | ICD-10-CM | POA: Diagnosis not present

## 2015-01-20 DIAGNOSIS — Z8639 Personal history of other endocrine, nutritional and metabolic disease: Secondary | ICD-10-CM | POA: Diagnosis not present

## 2015-01-20 DIAGNOSIS — Z859 Personal history of malignant neoplasm, unspecified: Secondary | ICD-10-CM | POA: Diagnosis not present

## 2015-01-20 DIAGNOSIS — R0602 Shortness of breath: Secondary | ICD-10-CM | POA: Diagnosis not present

## 2015-01-20 DIAGNOSIS — Z87891 Personal history of nicotine dependence: Secondary | ICD-10-CM | POA: Insufficient documentation

## 2015-01-20 LAB — CBC
HCT: 39.1 % (ref 36.0–46.0)
Hemoglobin: 11.9 g/dL — ABNORMAL LOW (ref 12.0–15.0)
MCH: 23 pg — ABNORMAL LOW (ref 26.0–34.0)
MCHC: 30.4 g/dL (ref 30.0–36.0)
MCV: 75.5 fL — ABNORMAL LOW (ref 78.0–100.0)
Platelets: 365 10*3/uL (ref 150–400)
RBC: 5.18 MIL/uL — ABNORMAL HIGH (ref 3.87–5.11)
RDW: 16.8 % — ABNORMAL HIGH (ref 11.5–15.5)
WBC: 6.2 10*3/uL (ref 4.0–10.5)

## 2015-01-20 LAB — BASIC METABOLIC PANEL
Anion gap: 7 (ref 5–15)
BUN: 12 mg/dL (ref 6–20)
CO2: 28 mmol/L (ref 22–32)
Calcium: 9 mg/dL (ref 8.9–10.3)
Chloride: 100 mmol/L — ABNORMAL LOW (ref 101–111)
Creatinine, Ser: 0.74 mg/dL (ref 0.44–1.00)
GFR calc Af Amer: >60 mL/min (ref >60)
GFR calc non Af Amer: >60 mL/min (ref >60)
Glucose, Bld: 107 mg/dL — ABNORMAL HIGH (ref 65–99)
Potassium: 3.8 mmol/L (ref 3.5–5.1)
Sodium: 135 mmol/L (ref 135–145)

## 2015-01-20 LAB — TROPONIN I: Troponin I: <0.03 ng/mL (ref <0.031)

## 2015-01-20 MED ORDER — IOHEXOL 350 MG/ML SOLN
100.0000 mL | Freq: Once | INTRAVENOUS | Status: AC | PRN
  Administered 2015-01-20: 100 mL via INTRAVENOUS

## 2015-01-20 NOTE — Telephone Encounter (Signed)
Patient Name: [name]  DOB: [birth year]    Initial Comment Caller states she has shortness of breath   Nurse Assessment  Nurse: Raphael Gibney, RN, Vera Date/Time (Eastern Time): 01/20/2015 2:11:51 PM  Confirm and document reason for call. If symptomatic, describe symptoms. ---Caller states she is having SOB. Pulse 100. No pain. BP 173/88 she thinks. She did water aerobics this am. She is fatigued. has had SOB for several days. She sounds SOB on the phone.  Has the patient traveled out of the country within the last 30 days? ---Not Applicable  Does the patient require triage? ---Yes  Related visit to physician within the last 2 weeks? ---No  Does the PT have any chronic conditions? (i.e. diabetes, asthma, etc.) ---Yes  List chronic conditions. ---polycythemia; history of heart condition     Guidelines    Guideline Title Affirmed Question Affirmed Notes  Breathing Difficulty [1] MODERATE difficulty breathing (e.g., speaks in phrases, SOB even at rest, pulse 100-120) AND [2] NEW-onset or WORSE than normal    Final Disposition User   Go to ED Now Raphael Gibney, RN, Vanita Ingles    Comments  Pt states she does not want to go to the ER but wants to make a doctor's appt. Called back line at office and spoke to Santiago Glad and gave her report that pt is SOB and sounded SOB on the phone, no chest pain, pulse 100 and BP 173/88 she thinks and has ER outcome but does not want to go to the ER. States she will call the pt back.   Referrals  GO TO FACILITY REFUSED   Disagree/Comply: Disagree  Disagree/Comply Reason: Disagree with instructions

## 2015-01-20 NOTE — ED Provider Notes (Signed)
CSN: [id number]     Arrival date & time 01/20/15  1504 History   First MD Initiated Contact with Patient 01/20/15 1523     Chief Complaint  Patient presents with  . Shortness of Breath     (Consider location/radiation/quality/duration/timing/severity/associated sxs/prior Treatment) Patient is a 81 y.o. female presenting with shortness of breath.  Shortness of Breath Severity:  Moderate Onset quality:  Gradual Duration: chronically, worse today. Timing:  Constant Progression:  Unchanged Chronicity:  Chronic Context comment:  Just after water aerobics Relieved by:  Nothing Worsened by:  Nothing tried Associated symptoms: no abdominal pain, no cough, no fever and no vomiting     Past Medical History  Diagnosis Date  . Headache(784.0)     migraines  . Hyperlipidemia   . Osteoporosis   . CVA (cerebral infarction)   . Hemorrhoids   . Hyperplastic colon polyp 06/2001  . Diverticulitis   . Varicose vein   . DVT (deep venous thrombosis)     after venous ablation, R leg  . Fracture 09/10/07    L2, status post vertebroplasty of L2 performed by IR  . Cardiomyopathy     Probable Takotsubo, severe CP w/ normal cath in 1994. Severe CP in 2003 w/ widespread T wave inversions on ECG. Cath w/ minimal coronary disease but LV-gram showed periapical severe hypokinesis and basilar hyperkinesia (EF 40%). Last echo in 4/09 confirmed full LV functional recovery with EF 60%, no regional wall motion abnormalities, mild to moderate LVH.  . OSA (obstructive sleep apnea) 03-29-11    no cpap used, not a problem now.  Marland Kitchen CAD (coronary artery disease)     mild nonobstructive disease on cath in 2003  . Hypertension   . GERD (gastroesophageal reflux disease)   . Hiatal hernia 03-29-11    no nerve problems  . Depression with anxiety 03-29-11    lost husband 3'09  . OA (osteoarthritis) of knee 03-29-11    w/ bilateral knee pain-not a problem now  . Stroke 03-29-11    CVA x2 -last 10'12-?TIA(visual  problems)  . E. coli gastroenteritis 03-29-11    8'10  . Cancer   . Polycythemia   . Otalgia of both ears     Dr. Simeon Craft   Past Surgical History  Procedure Laterality Date  . Appendectomy    . Ovarian cyst surgery      left  . Tonsillectomy    . Back surgery    . Tubal ligation    . Dental extraction      L maxillary molar  . Kyphosis surgery  08/2007    cement used  . Total knee arthroplasty  06/2010    left  . Cholecystectomy  04/09/2011    Procedure: LAPAROSCOPIC CHOLECYSTECTOMY WITH INTRAOPERATIVE CHOLANGIOGRAM;  Surgeon: Odis Hollingshead, MD;  Location: [facility];  Service: General;  Laterality: N/A;  . Breast biopsy    . Breast cyst excision    . Breast lumpectomy      left stage I left breast cancer   Family History  Problem Relation Age of Onset  . Colon cancer Other     cousin  . Breast cancer Mother   . Breast cancer Sister   . Heart disease Father     MIs  . Leukemia Brother     GM   Social History  Substance Use Topics  . Smoking status: Former Smoker -- 1.00 packs/day for 37 years    Types: Cigarettes    Start date: 06/26/1951  Quit date: 05/14/1988  . Smokeless tobacco: Never Used     Comment: quit 25 years ago  . Alcohol Use: 0.6 oz/week    1 Cans of beer per week     Comment: occasional/social   OB History    No data available     Review of Systems  Constitutional: Negative for fever.  Respiratory: Positive for shortness of breath. Negative for cough.   Gastrointestinal: Negative for vomiting and abdominal pain.  All other systems reviewed and are negative.     Allergies  Atorvastatin; Ezetimibe; Fluvastatin sodium; Magnesium hydroxide; Meloxicam; Pneumovax; Quinapril hcl; Simvastatin; Topamax; Valsartan; Vit d-vit e-safflower oil; Carvedilol; and Penicillins  Home Medications   Prior to Admission medications   Medication Sig Start Date End Date Taking? Authorizing Provider  acetaminophen-codeine (TYLENOL #3) 300-30 MG per tablet  Take 1 tablet by mouth every 8 (eight) hours as needed. For migraine 07/28/13   Colon Branch, MD  aspirin 81 MG tablet Take 81 mg by mouth daily.    Historical Provider, MD  Cholecalciferol (VITAMIN D) 2000 UNITS tablet Take 2,000 Units by mouth daily.    Historical Provider, MD  hydrochlorothiazide (HYDRODIURIL) 25 MG tablet Take 1 tablet (25 mg total) by mouth daily as needed (swelling in the legs ). Patient not taking: Reported on 11/12/2014 09/01/13   Colon Branch, MD  hydroxyurea (HYDREA) 500 MG capsule TAKE ONE CAPSULE BY MOUTH EVERY OTHER DAY, MAY TAKE WITH FOOD TO MINIMIZE SIDE EFFECTS 08/12/14   Volanda Napoleon, MD  letrozole [facility]) 2.5 MG tablet Take 1 tablet (2.5 mg total) by mouth daily. 01/10/15   Volanda Napoleon, MD  LORazepam (ATIVAN) 0.5 MG tablet Take 1 tablet (0.5 mg total) by mouth 2 (two) times daily as needed. 01/12/15   Colon Branch, MD  metoprolol (LOPRESSOR) 50 MG tablet Take 1 tablet (50 mg total) by mouth 2 (two) times daily. 11/12/14   Colon Branch, MD  omeprazole (PRILOSEC OTC) 20 MG tablet Take 20 mg by mouth daily.     Historical Provider, MD  Probiotic Product (PROBIOTIC DAILY PO) Take by mouth every morning.    Historical Provider, MD  simethicone (MYLICON) 211 MG chewable tablet Chew 125 mg by mouth every 6 (six) hours as needed for flatulence.    Historical Provider, MD  vitamin E 400 UNIT capsule Take 400 Units by mouth daily.     Historical Provider, MD   BP 168/70 mmHg  Pulse 74  Temp(Src) 98.4 F (36.9 C) (Oral)  Resp 20  Ht 5\' 6"  (1.676 m)  Wt 170 lb (77.111 kg)  BMI 27.45 kg/m2  SpO2 100% Physical Exam  Constitutional: She is oriented to person, place, and time. She appears well-developed and well-nourished.  HENT:  Head: Normocephalic and atraumatic.  Right Ear: External ear normal.  Left Ear: External ear normal.  Eyes: Conjunctivae and EOM are normal. Pupils are equal, round, and reactive to light.  Neck: Normal range of motion. Neck supple.  Cardiovascular:  Normal rate, regular rhythm, normal heart sounds and intact distal pulses.   Pulmonary/Chest: Effort normal and breath sounds normal.  Abdominal: Soft. Bowel sounds are normal. There is no tenderness.  Musculoskeletal: Normal range of motion.  Neurological: She is alert and oriented to person, place, and time.  Skin: Skin is warm and dry.  Vitals reviewed.   ED Course  Procedures (including critical care time) Labs Review Labs Reviewed  BASIC METABOLIC PANEL - Abnormal; Notable for the  following:    Chloride 100 (*)    Glucose, Bld 107 (*)    All other components within normal limits  CBC - Abnormal; Notable for the following:    RBC 5.18 (*)    Hemoglobin 11.9 (*)    MCV 75.5 (*)    MCH 23.0 (*)    RDW 16.8 (*)    All other components within normal limits  TROPONIN I    Imaging Review Dg Chest 2 View  01/20/2015   CLINICAL DATA:  Short of breath. Chest discomfort. Onset of symptoms today. Former cigarette smoker.  EXAM: CHEST  2 VIEW  COMPARISON:  Chest radiograph 11/19/2011.  FINDINGS: Blunting of the costophrenic angles is present on the lateral projection. This is a chronic finding and probably associated with emphysema. No airspace disease or pleural effusion. Moderate hiatal hernia. Mediastinal contours appear within normal limits. Partially radiopaque monitoring button projects over the RIGHT upper lobe. Right-greater-than-left AC joint osteoarthritis. Thoracolumbar vertebral augmentation visible on the lateral projection.  IMPRESSION: No active cardiopulmonary disease.   Electronically Signed   By: Dereck Ligas M.D.   On: 01/20/2015 16:06   Ct Angio Chest Pe W/cm &/or Wo Cm  01/20/2015   CLINICAL DATA:  Increasing shortness of breath.  Hypertension.  EXAM: CT ANGIOGRAPHY CHEST WITH CONTRAST  TECHNIQUE: Multidetector CT imaging of the chest was performed using the standard protocol during bolus administration of intravenous contrast. Multiplanar CT image reconstructions and  MIPs were obtained to evaluate the vascular anatomy.  CONTRAST:  139mL OMNIPAQUE IOHEXOL 350 MG/ML SOLN  COMPARISON:  Chest x-ray dated 01/20/2015  FINDINGS: There are no pulmonary emboli, infiltrates, effusions, or other acute abnormalities. Tiny calcified granuloma in the right lower lobe. Multiple calcified lymph nodes in the mediastinum consistent with previous granulomatous disease. No hilar or mediastinal adenopathy. Heart size is normal. There is fairly extensive coronary artery calcification.  There is a large hiatal hernia.  No acute osseous abnormality.  Review of the MIP images confirms the above findings.  IMPRESSION: 1. No pulmonary emboli. 2. Large hiatal hernia. 3. Extensive coronary artery calcification.  Aortic atherosclerosis.   Electronically Signed   By: Lorriane Shire M.D.   On: 01/20/2015 16:53   I have personally reviewed and evaluated these images and lab results as part of my medical decision-making.   EKG Interpretation   Date/Time:  Thursday January 20 2015 15:25:03 EDT Ventricular Rate:  70 PR Interval:  206 QRS Duration: 70 QT Interval:  370 QTC Calculation: 399 R Axis:   59 Text Interpretation:  Normal sinus rhythm Normal ECG No significant change  since last tracing Confirmed by Debby Freiberg [phone]) on 01/20/2015  3:34:32 PM      MDM   Final diagnoses:  Dyspnea    81 y.o. female with pertinent PMH of prior CVA, DVT, polycythemia presents with acute on chronic dyspnea as above. Patient attempted an appointment today with her PCP who is in town. She was informed by the nurse there to present here. Patient states she thought her heart rate was around 100. On arrival vital signs and physical exam as above, reassuring. No abnormalities on my exam. Normal heart rate on my exam. Workup as above without acute abnormality. Suspect likely chronic dyspnea for which the patient follow-up with PCP. No infectious signs indicating need for antibiotics. No other concerning  factors. No chest pain..    I have reviewed all laboratory and imaging studies if ordered as above  1. Dyspnea  Debby Freiberg, MD 01/20/15 [phone]

## 2015-01-20 NOTE — Telephone Encounter (Signed)
Called patient who states she is SOB with no chest pain. States she has had cramp in Left leg off and on this am. Says she took vitals this am and BP was 173/88 and pulse 100. States she took 3 Aspirin. Patient states she went to water Aerobics and still has SOB and cramping in Left calf. Requested appointment with Dr. Larose Kells next week advised patient that she should not wait until next with the symptoms she is having. Advised she needs to be seen in ER today. Patient agreed and states she will come to [facility] ED today.

## 2015-01-20 NOTE — Progress Notes (Unsigned)
Received call from [facility]. States patient is short of breath but refuses to come to ER. Called patient who states she has been SOB(can hear SOB over the phone) states she is not having chest pains at this time but she is having cramps periodically in her  Lft calf. States she is having no swelling in either leg.States her BP this am was 173/88 and her pulse was 100. States she took 3 Aspirin.  Patient states she would like appointment for next week with Dr. Larose Kells. Advised patient with her SOB and leg cramps she needed to be seen in ED. Patient agreed and states she will come to [facility] and will be leaving now.

## 2015-01-20 NOTE — ED Notes (Signed)
Reports SHOB x 2 weeks, worse today. Reports having some SHOB worse after water aerobics. Attempted to get appointment with PMD, but referred to ED to be checked for blood clot.

## 2015-01-20 NOTE — Telephone Encounter (Signed)
Message forwarded to Santiago Glad.

## 2015-01-20 NOTE — Discharge Instructions (Signed)

## 2015-01-21 NOTE — Telephone Encounter (Signed)
Patient presented to [facility] ED 01/20/15

## 2015-02-02 ENCOUNTER — Ambulatory Visit: Payer: Medicare Other

## 2015-02-02 ENCOUNTER — Ambulatory Visit (HOSPITAL_BASED_OUTPATIENT_CLINIC_OR_DEPARTMENT_OTHER): Payer: Medicare Other | Admitting: Hematology & Oncology

## 2015-02-02 ENCOUNTER — Other Ambulatory Visit (HOSPITAL_BASED_OUTPATIENT_CLINIC_OR_DEPARTMENT_OTHER): Payer: Medicare Other

## 2015-02-02 ENCOUNTER — Encounter: Payer: Self-pay | Admitting: Hematology & Oncology

## 2015-02-02 VITALS — BP 119/65 | HR 64 | Temp 97.3°F | Resp 16 | Ht 66.0 in | Wt 169.0 lb

## 2015-02-02 DIAGNOSIS — D751 Secondary polycythemia: Secondary | ICD-10-CM

## 2015-02-02 DIAGNOSIS — E611 Iron deficiency: Secondary | ICD-10-CM | POA: Diagnosis not present

## 2015-02-02 DIAGNOSIS — C50912 Malignant neoplasm of unspecified site of left female breast: Secondary | ICD-10-CM

## 2015-02-02 DIAGNOSIS — D45 Polycythemia vera: Secondary | ICD-10-CM | POA: Diagnosis not present

## 2015-02-02 DIAGNOSIS — C50919 Malignant neoplasm of unspecified site of unspecified female breast: Secondary | ICD-10-CM | POA: Diagnosis not present

## 2015-02-02 LAB — CBC WITH DIFFERENTIAL (CANCER CENTER ONLY)
BASO#: 0.1 10*3/uL (ref 0.0–0.2)
BASO%: 1.2 % (ref 0.0–2.0)
EOS%: 2.7 % (ref 0.0–7.0)
Eosinophils Absolute: 0.2 10*3/uL (ref 0.0–0.5)
HCT: 39.6 % (ref 34.8–46.6)
HGB: 11.9 g/dL (ref 11.6–15.9)
LYMPH#: 0.9 10*3/uL (ref 0.9–3.3)
LYMPH%: 14.9 % (ref 14.0–48.0)
MCH: 23.2 pg — ABNORMAL LOW (ref 26.0–34.0)
MCHC: 30.1 g/dL — ABNORMAL LOW (ref 32.0–36.0)
MCV: 77 fL — ABNORMAL LOW (ref 81–101)
MONO#: 0.6 10*3/uL (ref 0.1–0.9)
MONO%: 9.7 % (ref 0.0–13.0)
NEUT#: 4.3 10*3/uL (ref 1.5–6.5)
NEUT%: 71.5 % (ref 39.6–80.0)
Platelets: 432 10*3/uL — ABNORMAL HIGH (ref 145–400)
RBC: 5.14 10*6/uL (ref 3.70–5.32)
RDW: 17.2 % — ABNORMAL HIGH (ref 11.1–15.7)
WBC: 6 10*3/uL (ref 3.9–10.0)

## 2015-02-02 LAB — COMPREHENSIVE METABOLIC PANEL
ALT: 10 U/L (ref 6–29)
AST: 19 U/L (ref 10–35)
Albumin: 4 g/dL (ref 3.6–5.1)
Alkaline Phosphatase: 77 U/L (ref 33–130)
BUN: 8 mg/dL (ref 7–25)
CO2: 29 mmol/L (ref 20–31)
Calcium: 9.1 mg/dL (ref 8.6–10.4)
Chloride: 103 mmol/L (ref 98–110)
Creatinine, Ser: 0.8 mg/dL (ref 0.60–0.88)
Glucose, Bld: 90 mg/dL (ref 65–99)
Potassium: 4.8 mmol/L (ref 3.5–5.3)
Sodium: 136 mmol/L (ref 135–146)
Total Bilirubin: 1.2 mg/dL (ref 0.2–1.2)
Total Protein: 6.5 g/dL (ref 6.1–8.1)

## 2015-02-02 LAB — RETICULOCYTES (CHCC)
ABS Retic: 41.4 10*3/uL (ref 19.0–186.0)
RBC.: 5.18 MIL/uL — ABNORMAL HIGH (ref 3.87–5.11)
Retic Ct Pct: 0.8 % (ref 0.4–2.3)

## 2015-02-02 LAB — IRON AND TIBC CHCC
%SAT: 6 % — ABNORMAL LOW (ref 21–57)
Iron: 23 ug/dL — ABNORMAL LOW (ref 41–142)
TIBC: 420 ug/dL (ref 236–444)
UIBC: 397 ug/dL — ABNORMAL HIGH (ref 120–384)

## 2015-02-02 LAB — FERRITIN CHCC: Ferritin: 25 ng/ml (ref 9–269)

## 2015-02-02 LAB — CHCC SATELLITE - SMEAR

## 2015-02-02 NOTE — Progress Notes (Signed)
Patient does not need a phlebotomy today per Dr Marin Olp.

## 2015-02-02 NOTE — Progress Notes (Signed)
Hematology and Oncology Follow Up Visit  [name] [id number] 06-19-1929 81 y.o. 02/02/2015   Principle Diagnosis:   Polycythemia vera-JAK2 positive  Stage I ([id number] ) infiltrating duct carcinoma the left breast   Current Therapy:    #1 phlebotomy to maintain hematocrit below 45%.  #2 Hydrea 500 mg by mouth every other day.  #3 Femara 2.5 mg by mouth daily     Interim History:  [name] is back for followup.  She has had no rashes.  Her appetite is doing okay.  Overall, her performance status is ECOG 1.    Medications:  Current outpatient prescriptions:  .  acetaminophen-codeine (TYLENOL #3) 300-30 MG per tablet, Take 1 tablet by mouth every 8 (eight) hours as needed. For migraine, Disp: 120 tablet, Rfl: 0 .  aspirin 81 MG tablet, Take 81 mg by mouth daily., Disp: , Rfl:  .  Cholecalciferol (VITAMIN D) 2000 UNITS tablet, Take 2,000 Units by mouth daily., Disp: , Rfl:  .  hydrochlorothiazide (HYDRODIURIL) 25 MG tablet, Take 1 tablet (25 mg total) by mouth daily as needed (swelling in the legs )., Disp: 90 tablet, Rfl: 2 .  hydroxyurea (HYDREA) 500 MG capsule, TAKE ONE CAPSULE BY MOUTH EVERY OTHER DAY, MAY TAKE WITH FOOD TO MINIMIZE SIDE EFFECTS, Disp: 90 capsule, Rfl: 3 .  letrozole (FEMARA) 2.5 MG tablet, Take 1 tablet (2.5 mg total) by mouth daily., Disp: 90 tablet, Rfl: 4 .  LORazepam (ATIVAN) 0.5 MG tablet, Take 1 tablet (0.5 mg total) by mouth 2 (two) times daily as needed., Disp: 60 tablet, Rfl: 1 .  metoprolol (LOPRESSOR) 50 MG tablet, Take 1 tablet (50 mg total) by mouth 2 (two) times daily., Disp: 180 tablet, Rfl: 1 .  Probiotic Product (PROBIOTIC DAILY PO), Take by mouth every morning., Disp: , Rfl:  .  simethicone (MYLICON) 315 MG chewable tablet, Chew 125 mg by mouth every 6 (six) hours as needed for flatulence., Disp: , Rfl:  .  vitamin E 400 UNIT capsule, Take 400 Units by mouth daily. , Disp: , Rfl:  .  omeprazole (PRILOSEC OTC) 20 MG tablet, Take  20 mg by mouth daily. , Disp: , Rfl:   Allergies:  Allergies  Allergen Reactions  . Atorvastatin Diarrhea    REACTION: diarrhe  . Ezetimibe     REACTION: nausea  . Fluvastatin Sodium Other (See Comments)    "Can't remember"  . Magnesium Hydroxide Other (See Comments)    REACTION: triggers HAs  . Meloxicam Other (See Comments)    Pt seeing auro's / spots.    . Pneumovax [Pneumococcal Polysaccharide Vaccine]     Local reaction  . Quinapril Hcl Other (See Comments)    03-29-11 "feelings of tiredness"  . Simvastatin Diarrhea  . Topamax [Topiramate] Itching  . Valsartan Itching and Other (See Comments)    REACTION: toes itch  . Vit D-Vit E-Safflower Oil Other (See Comments)    Headaches  . Carvedilol     "teeth hurt when I took it"  . Penicillins Other (See Comments) and Rash    Other Reaction: OTHER REACTION Mouth blisters 03-29-11    Past Medical History, Surgical history, Social history, and Family History were reviewed and updated.  Review of Systems: As above  Physical Exam:  height is 5\' 6"  (1.676 m) and weight is 169 lb (76.658 kg). Her oral temperature is 97.3 F (36.3 C). Her blood pressure is 119/65 and her pulse is 64. Her respiration is 16.  Well-developed and well-nourished white female. Head and neck exam shows no ocular or oral lesions. She has no palpable cervical or supraclavicular lymph nodes. Lungs are clear bilaterally. Cardiac exam regular rate and rhythm with a normal S1 and S2. There are no murmurs rubs or bruits. Breast exam shows right breast with no masses or edema or erythema. There is no right axillary adenopathy. Left breast shows well-healed lumpectomy at the 2:00 position. She has no t obvious mass in the left breast. There is some slight contraction from radiation. She does not have any obvious left axillary adenopathy. Abdomen is soft. Has good bowel sounds. There is no palpable abdominal mass. There is no palpable liver or spleen tip. Neck exam  shows no kyphosis. There is no tenderness over the spine ribs or hips. Extremities shows no lymphedema. She has no joint swelling. She's good range most of her joints. Skin exam no rashes. Neurological exam shows no focal neurological deficits.  Lab Results  Component Value Date   WBC 6.0 02/02/2015   HGB 11.9 02/02/2015   HCT 39.6 02/02/2015   MCV 77* 02/02/2015   PLT 432* 02/02/2015     Chemistry      Component Value Date/Time   NA 135 01/20/2015 1535   NA 138 06/25/2013 1356   K 3.8 01/20/2015 1535   K 4.4 06/25/2013 1356   CL 100* 01/20/2015 1535   CL 101 06/25/2013 1356   CO2 28 01/20/2015 1535   CO2 32 06/25/2013 1356   BUN 12 01/20/2015 1535   BUN 8 06/25/2013 1356   CREATININE 0.74 01/20/2015 1535   CREATININE 0.7 06/25/2013 1356      Component Value Date/Time   CALCIUM 9.0 01/20/2015 1535   CALCIUM 9.3 06/25/2013 1356   ALKPHOS 85 11/03/2014 1024   ALKPHOS 85* 06/25/2013 1356   AST 19 11/03/2014 1024   AST 22 06/25/2013 1356   ALT 13 11/03/2014 1024   ALT 13 06/25/2013 1356   BILITOT 1.1 11/03/2014 1024   BILITOT 1.10 06/25/2013 1356         Impression and Plan: [name] is a 81 year old white female with 2 separate problems. She is doing very well with respect to the polycythemia. We do not have to phlebotomize her. Again, I think this is reflective of the iron deficiency..    For now, she does not need any testing. She just had a CT angiogram done about 3 weeks ago. This all looked fine.  We will plan to go back in about 3 months or so.     Volanda Napoleon, MD 9/21/201610:38 AM

## 2015-02-15 ENCOUNTER — Encounter: Payer: Self-pay | Admitting: Internal Medicine

## 2015-02-15 ENCOUNTER — Ambulatory Visit (INDEPENDENT_AMBULATORY_CARE_PROVIDER_SITE_OTHER): Payer: Medicare Other | Admitting: Internal Medicine

## 2015-02-15 ENCOUNTER — Encounter: Payer: Self-pay | Admitting: Gastroenterology

## 2015-02-15 VITALS — BP 126/72 | HR 68 | Temp 98.2°F | Ht 66.0 in | Wt 169.1 lb

## 2015-02-15 DIAGNOSIS — Z23 Encounter for immunization: Secondary | ICD-10-CM

## 2015-02-15 DIAGNOSIS — Z09 Encounter for follow-up examination after completed treatment for conditions other than malignant neoplasm: Secondary | ICD-10-CM | POA: Insufficient documentation

## 2015-02-15 DIAGNOSIS — R0600 Dyspnea, unspecified: Secondary | ICD-10-CM

## 2015-02-15 DIAGNOSIS — R1013 Epigastric pain: Secondary | ICD-10-CM

## 2015-02-15 DIAGNOSIS — K449 Diaphragmatic hernia without obstruction or gangrene: Secondary | ICD-10-CM | POA: Diagnosis not present

## 2015-02-15 MED ORDER — LORAZEPAM 0.5 MG PO TABS: 0.5000 mg | ORAL_TABLET | Freq: Two times a day (BID) | ORAL | Status: DC | PRN

## 2015-02-15 NOTE — Patient Instructions (Signed)
Please call anytime if your stomach symptoms get worse particularly if you have pain when you swallow.   Next visit  for a complete physical exam by November 2016        Please schedule an appointment at the front desk Please come back fasting

## 2015-02-15 NOTE — Progress Notes (Signed)
Subjective:    Patient ID: [name], female    DOB: 08-06-1929, 81 y.o.   MRN: [id number]  DOS:  02/15/2015 Type of visit - description : Acute visit Interval history:  The patient make the appointment today to talk about her stomach symptoms, reports that she feels bloated and a burning sensation from the throat down to the suprapubic area every day after 3 PM. The symptoms may last few hours. They increase after  she takes her dinner. She is not taking omeprazole or any acid reducers because she is afraid  they may contain magnesium which she can't tolerate. She is not taking any NSAIDs. She went to the  ER 01/20/2015 SOB, felt to be a chronic issue, EKG unchanged from previous, CT chest with no PE, BMP normal, hemoglobin 11.9   ,  Review of Systems No fever, occasional chills, occasional night sweats. No weight loss. No chronic nausea or vomiting although one night last week she did have an episode of vomiting without hematemesis. No blood in the stools. Bowel movements are usually normal. When asked, she admits to some dysphagia but no odynophagia. She does have mild heartburn.  Wt Readings from Last 3 Encounters:  02/15/15 169 lb 2 oz (76.715 kg)  02/02/15 169 lb (76.658 kg)  01/20/15 170 lb (77.111 kg)     Past Medical History  Diagnosis Date  . Headache(784.0)     migraines  . Hyperlipidemia   . Osteoporosis   . CVA (cerebral infarction)   . Hemorrhoids   . Hyperplastic colon polyp 06/2001  . Diverticulitis   . Varicose vein   . DVT (deep venous thrombosis) ([place])     after venous ablation, R leg  . Fracture 09/10/07    L2, status post vertebroplasty of L2 performed by IR  . Cardiomyopathy     Probable Takotsubo, severe CP w/ normal cath in 1994. Severe CP in 2003 w/ widespread T wave inversions on ECG. Cath w/ minimal coronary disease but LV-gram showed periapical severe hypokinesis and basilar hyperkinesia (EF 40%). Last echo in 4/09 confirmed full LV  functional recovery with EF 60%, no regional wall motion abnormalities, mild to moderate LVH.  . OSA (obstructive sleep apnea) 03-29-11    no cpap used, not a problem now.  Marland Kitchen CAD (coronary artery disease)     mild nonobstructive disease on cath in 2003  . Hypertension   . GERD (gastroesophageal reflux disease)   . Hiatal hernia 03-29-11    no nerve problems  . Depression with anxiety 03-29-11    lost husband 3'09  . OA (osteoarthritis) of knee 03-29-11    w/ bilateral knee pain-not a problem now  . Stroke [facility]) 03-29-11    CVA x2 -last 10'12-?TIA(visual problems)  . E. coli gastroenteritis 03-29-11    8'10  . Cancer ([place])   . Polycythemia   . Otalgia of both ears     Dr. Simeon Craft    Past Surgical History  Procedure Laterality Date  . Appendectomy    . Ovarian cyst surgery      left  . Tonsillectomy    . Back surgery    . Tubal ligation    . Dental extraction      L maxillary molar  . Kyphosis surgery  08/2007    cement used  . Total knee arthroplasty  06/2010    left  . Cholecystectomy  04/09/2011    Procedure: LAPAROSCOPIC CHOLECYSTECTOMY WITH INTRAOPERATIVE CHOLANGIOGRAM;  Surgeon: Rhunette Croft  Rosenbower, MD;  Location: [facility];  Service: General;  Laterality: N/A;  . Breast biopsy    . Breast cyst excision    . Breast lumpectomy      left stage I left breast cancer    Social History   Social History  . Marital Status: Widowed    Spouse Name: N/A  . Number of Children: 3  . Years of Education: N/A   Occupational History  . n/a    Social History Main Topics  . Smoking status: Former Smoker -- 1.00 packs/day for 37 years    Types: Cigarettes    Start date: 06/26/1951    Quit date: 05/14/1988  . Smokeless tobacco: Never Used     Comment: quit 25 years ago  . Alcohol Use: 0.6 oz/week    1 Cans of beer per week     Comment: occasional/social  . Drug Use: No  . Sexual Activity: No   Other Topics Concern  . Not on file   Social History Narrative   Lost husband  07/29/07-    Lives in [place] in a town house, her daughter moved in w/ her   Drives                     Medication List       This list is accurate as of: 02/15/15  9:35 PM.  Always use your most recent med list.               acetaminophen-codeine 300-30 MG tablet  Commonly known as:  TYLENOL #3  Take 1 tablet by mouth every 8 (eight) hours as needed. For migraine     aspirin 81 MG tablet  Take 81 mg by mouth daily.     hydrochlorothiazide 25 MG tablet  Commonly known as:  HYDRODIURIL  Take 1 tablet (25 mg total) by mouth daily as needed (swelling in the legs ).     hydroxyurea 500 MG capsule  Commonly known as:  HYDREA  TAKE ONE CAPSULE BY MOUTH EVERY OTHER DAY, MAY TAKE WITH FOOD TO MINIMIZE SIDE EFFECTS     letrozole 2.5 MG tablet  Commonly known as:  FEMARA  Take 1 tablet (2.5 mg total) by mouth daily.     LORazepam 0.5 MG tablet  Commonly known as:  ATIVAN  Take 1 tablet (0.5 mg total) by mouth 2 (two) times daily as needed.     metoprolol 50 MG tablet  Commonly known as:  LOPRESSOR  Take 1 tablet (50 mg total) by mouth 2 (two) times daily.     PROBIOTIC DAILY PO  Take by mouth every morning.     simethicone 125 MG chewable tablet  Commonly known as:  MYLICON  Chew 710 mg by mouth every 6 (six) hours as needed for flatulence.     Vitamin D 2000 UNITS tablet  Take 2,000 Units by mouth daily.     vitamin E 400 UNIT capsule  Take 400 Units by mouth daily.           Objective:   Physical Exam BP 126/72 mmHg  Pulse 68  Temp(Src) 98.2 F (36.8 C) (Oral)  Ht 5\' 6"  (1.676 m)  Wt 169 lb 2 oz (76.715 kg)  BMI 27.31 kg/m2  SpO2 96% General:   Well developed, well nourished . NAD.  HEENT:  Normocephalic . Face symmetric, atraumatic Neck: No LAD or mass Lungs:  CTA B Normal respiratory effort, no intercostal  retractions, no accessory muscle use. Heart: RRR,  no murmur.  no pretibial edema bilaterally  Abdomen:  Not distended,  soft, non-tender. No rebound or rigidity. No mass,organomegaly Skin: Not pale. Not jaundice Neurologic:  alert & oriented X3.  Speech normal, gait appropriate for age and unassisted Psych--  Cognition and judgment appear intact.  Cooperative with normal attention span and concentration.  Behavior appropriate. No anxious or depressed appearing.    Assessment & Plan:   Plan  Dyspepsia: Patient has ongoing dyspepsia, going on for several months.She had a CT last year and recently last month, in both cases she had a large hiatal hernia. Never had a EGD. There is no weight loss, she reports occasional night sweats but last CBC was okay except for mild anemia. Suspect symptomatic hiatal hernia,  does not like to take any acid reducers. Plan: GI referral, symptomatic hiatal hernia? Patient to call if symptoms increase are severe. Dyspnea: At baseline Primary care: Flu shot today

## 2015-02-15 NOTE — Progress Notes (Signed)
Pre visit review using our clinic review tool, if applicable. No additional management support is needed unless otherwise documented below in the visit note. 

## 2015-02-15 NOTE — Assessment & Plan Note (Signed)
Dyspepsia: Patient has ongoing dyspepsia, going on for several months.She had a CT last year and recently last month, in both cases she had a large hiatal hernia. Never had a EGD. There is no weight loss, she reports occasional night sweats but last CBC was okay except for mild anemia. Suspect symptomatic hiatal hernia,  does not like to take any acid reducers. Plan: GI referral, symptomatic hiatal hernia? Patient to call if symptoms increase are severe. Dyspnea: At baseline Primary care: Flu shot today

## 2015-04-09 IMAGING — CT CT ABD-PELV W/ CM
2 of 5 series · 17 of 46 positions shown, 19 images · IV contrast (omnipaque)
Comparison: 02/21/2004

CLINICAL DATA: Left lower quadrant abdominal pain, history of
diverticulitis

CT ABDOMEN AND PELVIS WITH CONTRAST
TECHNIQUE: Multidetector CT imaging of the abdomen and pelvis was
performed following the standard protocol during bolus
administration of intravenous contrast.
Contrast: 100mL OMNIPAQUE IOHEXOL 300 MG/ML  SOLN

[Series 2: abd/ pel 5mm · axial · 0.71mm/px · z∈[-454,-64]mm · 14 of 88 slices shown, 16 images]
[im 5/88  soft-tissue]
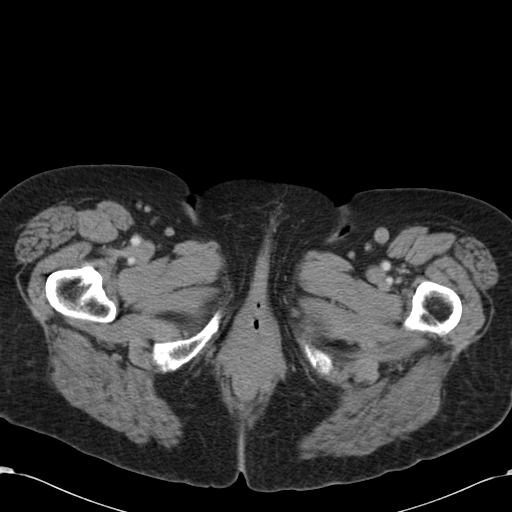
[im 5/88  bone]
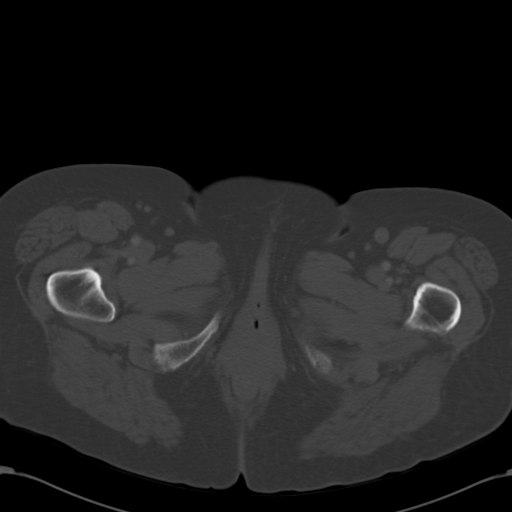
[im 14/88  soft-tissue]
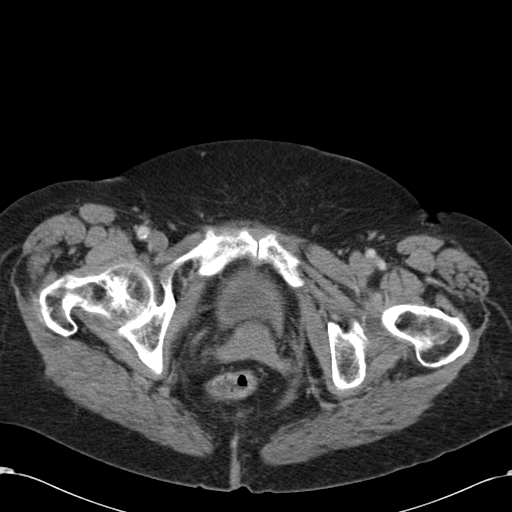
[im 18/88  soft-tissue]
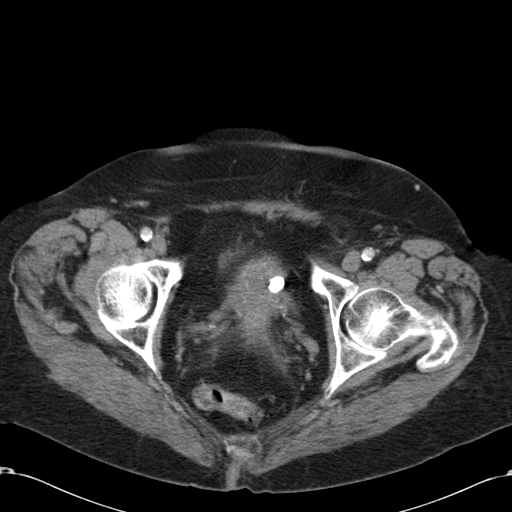
[im 22/88  soft-tissue]
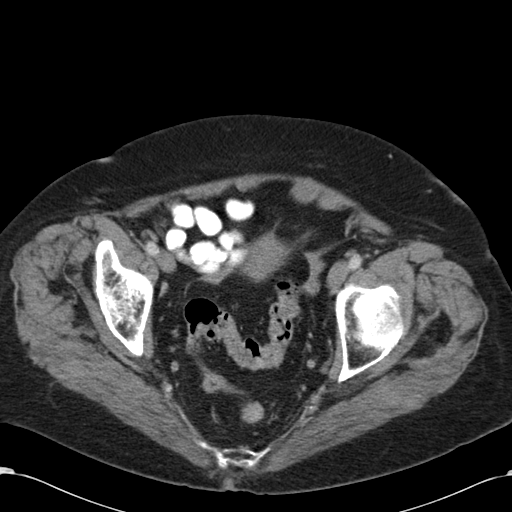
[im 31/88  soft-tissue]
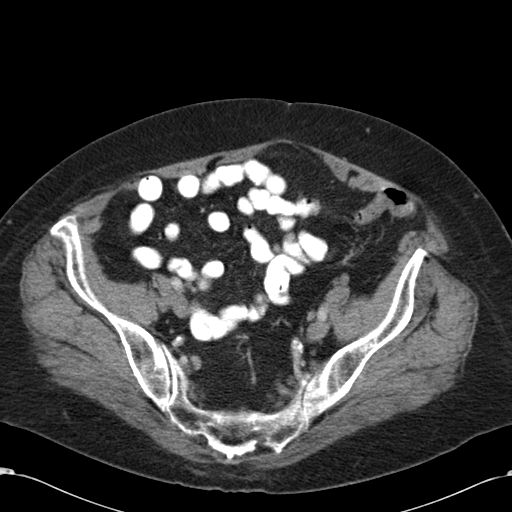
[im 35/88  soft-tissue]
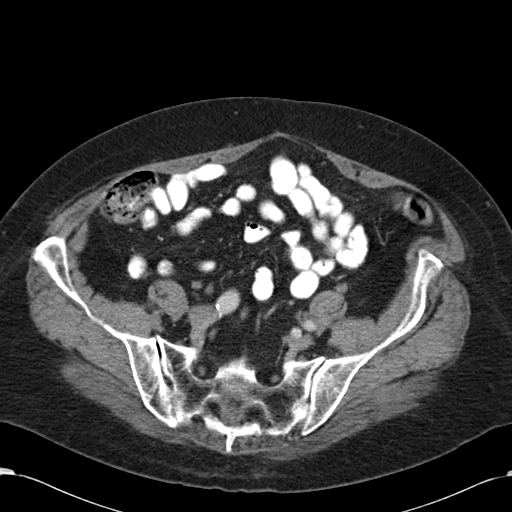
[im 40/88  soft-tissue]
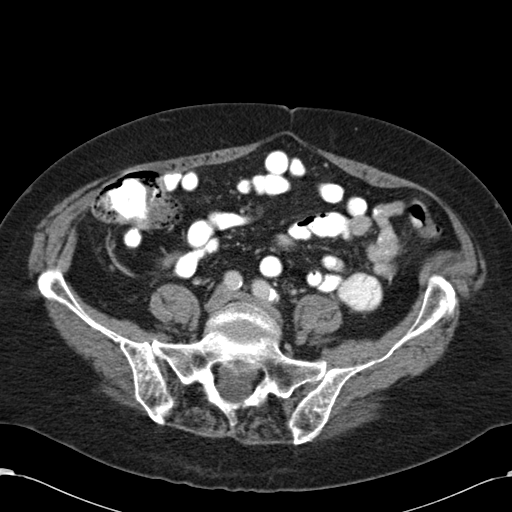
[im 48/88  soft-tissue]
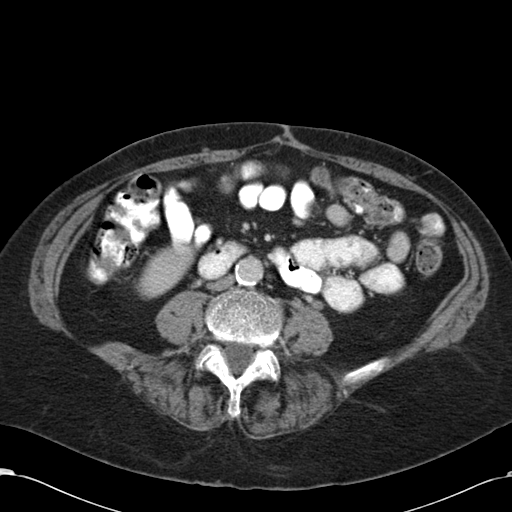
[im 53/88  soft-tissue]
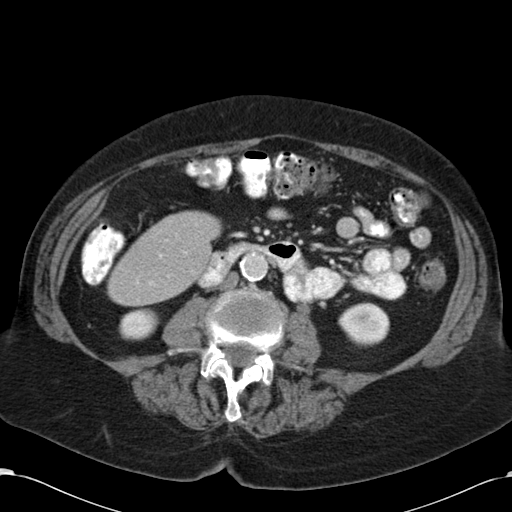
[im 53/88  bone]
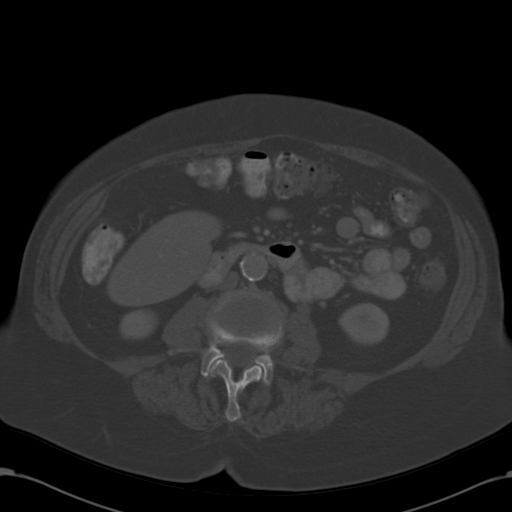
[im 57/88  soft-tissue]
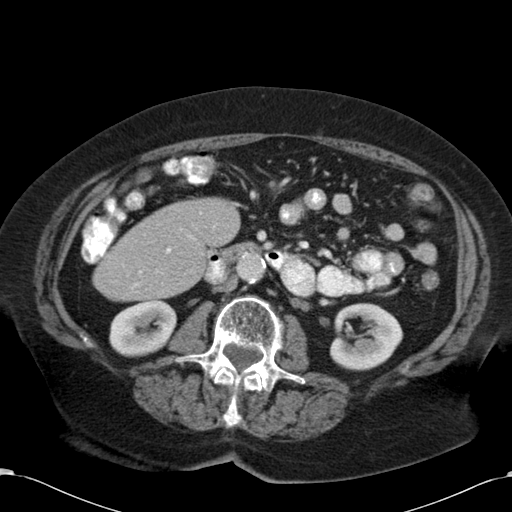
[im 66/88  soft-tissue]
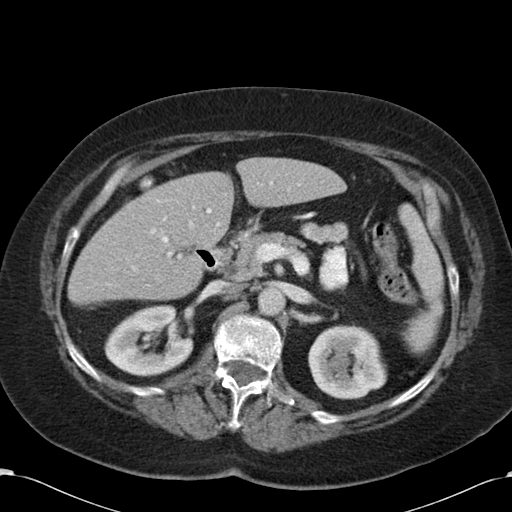
[im 70/88  soft-tissue]
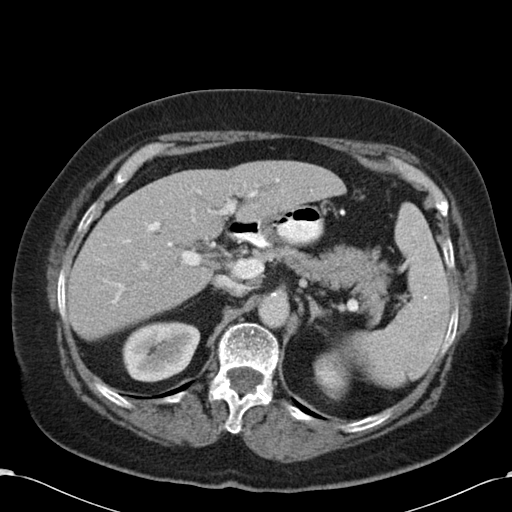
[im 74/88  soft-tissue]
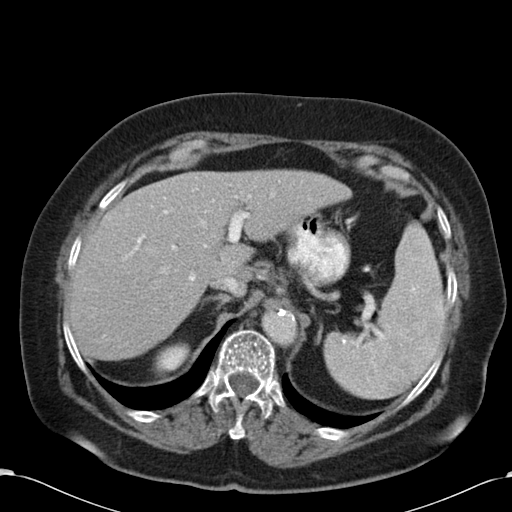
[im 83/88  soft-tissue]
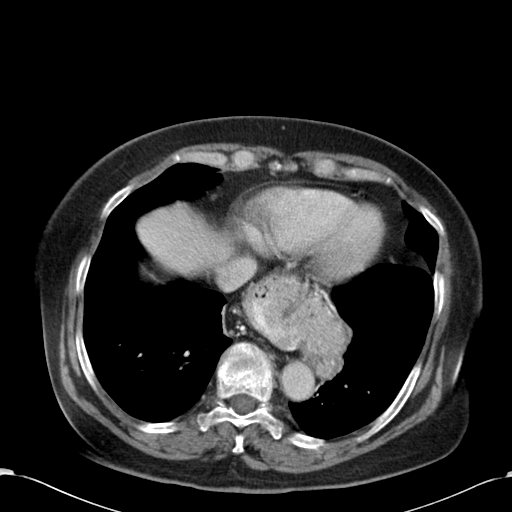

[Series 602: cor · coronal · 0.89mm/px · 3 of 110 slices shown]
[im 37/110  soft-tissue]
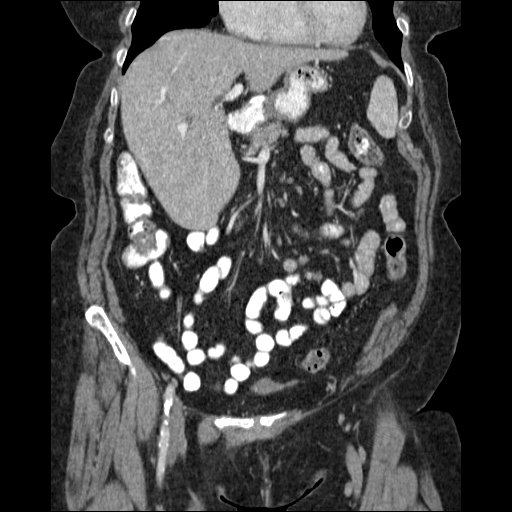
[im 49/110  soft-tissue]
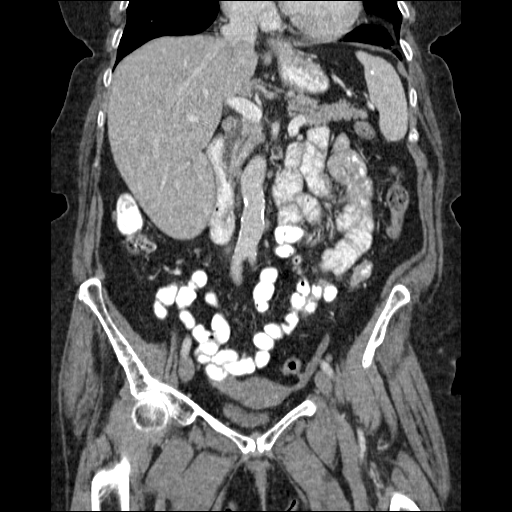
[im 61/110  soft-tissue]
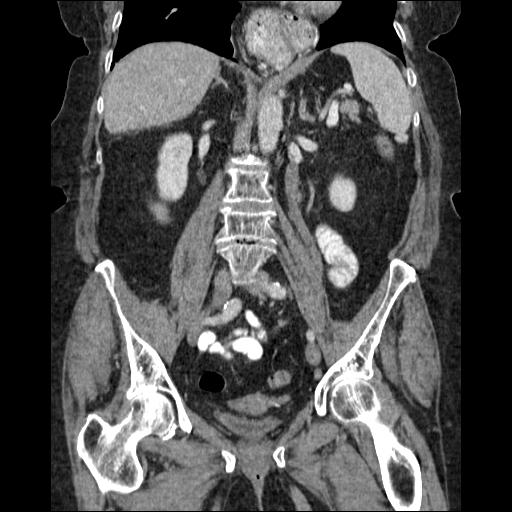

[17 of 46 positions shown; findings below may reference images not displayed]

FINDINGS: Lung bases are essentially clear.

Large hiatal hernia.

Liver, pancreas, and adrenal glands are within normal limits.

Numerous splenic granulomata.

Status post cholecystectomy.  No intrahepatic or extrahepatic
ductal dilatation.

Kidneys are within normal limits.  No hydronephrosis.

No evidence of bowel obstruction.  Appendix is not discretely
visualized is reportedly surgically absent.  Mild diverticulosis,
without associated inflammatory changes.

Atherosclerotic calcifications of the abdominal aorta and branch
vessels.

No abdominopelvic ascites.

No suspicious abdominopelvic lymphadenopathy.

Suspected calcified uterine fibroid (series 2/image 71).  Bilateral
ovaries are grossly unremarkable.

Bladder is underdistended.

Degenerative changes of the visualized thoracolumbar spine.  Prior
vertebral augmentation at L2.  Mild superior endplate changes at
L4.
IMPRESSION: No evidence of diverticulitis.

No evidence of bowel obstruction.  Prior appendectomy.

Large hiatal hernia.

No CT findings to account for the patient's left lower quadrant
abdominal pain.

## 2015-04-12 ENCOUNTER — Encounter: Payer: Self-pay | Admitting: Internal Medicine

## 2015-04-12 ENCOUNTER — Ambulatory Visit (INDEPENDENT_AMBULATORY_CARE_PROVIDER_SITE_OTHER): Payer: Medicare Other | Admitting: Internal Medicine

## 2015-04-12 VITALS — BP 120/78 | HR 77 | Temp 98.2°F | Resp 20 | Ht 66.0 in | Wt 169.4 lb

## 2015-04-12 DIAGNOSIS — G4700 Insomnia, unspecified: Secondary | ICD-10-CM | POA: Diagnosis not present

## 2015-04-12 DIAGNOSIS — Z09 Encounter for follow-up examination after completed treatment for conditions other than malignant neoplasm: Secondary | ICD-10-CM | POA: Diagnosis not present

## 2015-04-12 DIAGNOSIS — F341 Dysthymic disorder: Secondary | ICD-10-CM | POA: Diagnosis not present

## 2015-04-12 MED ORDER — LORAZEPAM 0.5 MG PO TABS: ORAL_TABLET | ORAL | Status: DC

## 2015-04-12 MED ORDER — CITALOPRAM HYDROBROMIDE 20 MG PO TABS: 20.0000 mg | ORAL_TABLET | Freq: Every day | ORAL | Status: DC

## 2015-04-12 NOTE — Patient Instructions (Addendum)
Start taking citalopram 20 mg: The first week take only half tablet at night, after one week take 1 tablet.  Okay to continue taking Ativan as you're doing, at some point once citalopram  Starts working, you may need less Ativan.  Watch for excessive drowsiness

## 2015-04-12 NOTE — Progress Notes (Signed)
Subjective:    Patient ID: [name], female    DOB: [birth year], 81 y.o.   MRN: [id number]  DOS:  04/12/2015 Type of visit - description : Acute visit, here with her daughter Interval history: In the last few weeks, she had trouble sleeping, she increased her Ativan from one tablet twice a day to one tablet in the morning and 2 at night and that is helping. On further questioning, she admits she is somewhat depressed, crying very easily. One of her daughters was living w/ her and she decided to become independent, although that wasn't issue, she feels that is probably affecting her. She now lives by herself. No suicidal ideas. Mild anxiety throughout the day.   Review of Systems   Past Medical History  Diagnosis Date  . Headache(784.0)     migraines  . Hyperlipidemia   . Osteoporosis   . CVA (cerebral infarction)   . Hemorrhoids   . Hyperplastic colon polyp 06/2001  . Diverticulitis   . Varicose vein   . DVT (deep venous thrombosis) ([place])     after venous ablation, R leg  . Fracture 09/10/07    L2, status post vertebroplasty of L2 performed by IR  . Cardiomyopathy     Probable Takotsubo, severe CP w/ normal cath in 1994. Severe CP in 2003 w/ widespread T wave inversions on ECG. Cath w/ minimal coronary disease but LV-gram showed periapical severe hypokinesis and basilar hyperkinesia (EF 40%). Last echo in 4/09 confirmed full LV functional recovery with EF 60%, no regional wall motion abnormalities, mild to moderate LVH.  . OSA (obstructive sleep apnea) 03-29-11    no cpap used, not a problem now.  Marland Kitchen CAD (coronary artery disease)     mild nonobstructive disease on cath in 2003  . Hypertension   . GERD (gastroesophageal reflux disease)   . Hiatal hernia 03-29-11    no nerve problems  . Depression with anxiety 03-29-11    lost husband 3'09  . OA (osteoarthritis) of knee 03-29-11    w/ bilateral knee pain-not a problem now  . Stroke [facility] 03-29-11    CVA x2 -last  10'12-?TIA(visual problems)  . E. coli gastroenteritis 03-29-11    8'10  . Cancer ([place])   . Polycythemia   . Otalgia of both ears     Dr. Simeon Craft    Past Surgical History  Procedure Laterality Date  . Appendectomy    . Ovarian cyst surgery      left  . Tonsillectomy    . Back surgery    . Tubal ligation    . Dental extraction      L maxillary molar  . Kyphosis surgery  08/2007    cement used  . Total knee arthroplasty  06/2010    left  . Cholecystectomy  04/09/2011    Procedure: LAPAROSCOPIC CHOLECYSTECTOMY WITH INTRAOPERATIVE CHOLANGIOGRAM;  Surgeon: Odis Hollingshead, MD;  Location: [facility];  Service: General;  Laterality: N/A;  . Breast biopsy    . Breast cyst excision    . Breast lumpectomy      left stage I left breast cancer    Social History   Social History  . Marital Status: Widowed    Spouse Name: N/A  . Number of Children: 3  . Years of Education: N/A   Occupational History  . n/a    Social History Main Topics  . Smoking status: Former Smoker -- 1.00 packs/day for 37 years    Types:  Cigarettes    Start date: 06/26/1951    Quit date: 05/14/1988  . Smokeless tobacco: Never Used     Comment: quit 25 years ago  . Alcohol Use: 0.6 oz/week    1 Cans of beer per week     Comment: occasional/social  . Drug Use: No  . Sexual Activity: No   Other Topics Concern  . Not on file   Social History Narrative   Lost husband 07/29/07-    Lives in [place] in a town house, her daughter moved in w/ her   Drives                     Medication List       This list is accurate as of: 04/12/15 11:59 PM.  Always use your most recent med list.               acetaminophen-codeine 300-30 MG tablet  Commonly known as:  TYLENOL #3  Take 1 tablet by mouth every 8 (eight) hours as needed. For migraine     aspirin 81 MG tablet  Take 81 mg by mouth daily.     citalopram 20 MG tablet  Commonly known as:  CELEXA  Take 1 tablet (20 mg total) by mouth  daily.     hydrochlorothiazide 25 MG tablet  Commonly known as:  HYDRODIURIL  Take 1 tablet (25 mg total) by mouth daily as needed (swelling in the legs ).     hydroxyurea 500 MG capsule  Commonly known as:  HYDREA  TAKE ONE CAPSULE BY MOUTH EVERY OTHER DAY, MAY TAKE WITH FOOD TO MINIMIZE SIDE EFFECTS     letrozole 2.5 MG tablet  Commonly known as:  FEMARA  Take 1 tablet (2.5 mg total) by mouth daily.     LORazepam 0.5 MG tablet  Commonly known as:  ATIVAN  1 tablet in the morning, 2 tablets at bedtime as needed     metoprolol 50 MG tablet  Commonly known as:  LOPRESSOR  Take 1 tablet (50 mg total) by mouth 2 (two) times daily.     PROBIOTIC DAILY PO  Take by mouth every morning.     simethicone 125 MG chewable tablet  Commonly known as:  MYLICON  Chew 0000000 mg by mouth every 6 (six) hours as needed for flatulence.     Vitamin D 2000 UNITS tablet  Take 2,000 Units by mouth daily.     vitamin E 400 UNIT capsule  Take 400 Units by mouth daily.           Objective:   Physical Exam BP 120/78 mmHg  Pulse 77  Temp(Src) 98.2 F (36.8 C) (Oral)  Resp 20  Ht 5\' 6"  (1.676 m)  Wt 169 lb 6.4 oz (76.839 kg)  BMI 27.35 kg/m2  SpO2 98% General:   Well developed, well nourished . NAD.  HEENT:  Normocephalic . Face symmetric, atraumatic   Skin: Not pale. Not jaundice Neurologic:  alert & oriented X3.  Speech normal, gait appropriate for age and unassisted Psych--  Cognition and judgment appear intact.  Cooperative with normal attention span and concentration.  Behavior appropriate. No obvious anxiety or depression during the visit     Assessment & Plan:    Assessment> Hypertension Dyslipidemia Depression, anxiety MSK: ---DJD ---Osteoporosis ---L2 fractures, s/p vertebral plasty 2009 Migraine headaches GI: GERD, hiatal hernia, diverticulitis, Colon polyps  CV: ---Cardiomyopathy Probable Takotsubo cardiomyopathy: Severe chest pain with normal cath  in 1994.   Severe chest pain in 2003 with widespread T wave inversions on ECG.  Cath with minimal coronary disease but LV-gram showed periapical severe hypokinesis and basilar hyperkinesia (EF 40%).  Last echo in 4/09 confirmed full LV functional recovery with EF 60%, mild LVH. ---Stroke / TIA 2012 Sleep apnea 2012, no dilation now, not on CPAP H/oDVT, right leg after venous ablation H/o Breast cancer  PLAN Anxiety, depression, insomnia: Used to be well-controlled with Ativan only, now having some depression and difficulty sleeping. Symptoms likely triggered by the fact that now she is living by herself, her daughter move out. Patient is counseled, we discussed introducing SSRIs and she is in agreement. Will start citalopram, hopefully once it kicks in she will need less Ativan. See instructions.  RTC in few days, she already has an appointment   Today, I spent more than 25   min with the patient and her daughter. >50% of the time counseling regards insomnia, depression: Treatment options, pros and cons of SSRIs, multiple questions answered. Also reviewed the chart.

## 2015-04-12 NOTE — Progress Notes (Signed)
Pre visit review using our clinic review tool, if applicable. No additional management support is needed unless otherwise documented below in the visit note. 

## 2015-04-13 NOTE — Assessment & Plan Note (Signed)
Anxiety, depression, insomnia: Used to be well-controlled with Ativan only, now having some depression and difficulty sleeping. Symptoms likely triggered by the fact that now she is living by herself, her daughter move out. Patient is counseled, we discussed introducing SSRIs and she is in agreement. Will start citalopram, hopefully once it kicks in she will need less Ativan. See instructions.  RTC in few days, she already has an appointment

## 2015-04-15 ENCOUNTER — Ambulatory Visit: Payer: Medicare Other | Admitting: Gastroenterology

## 2015-04-19 ENCOUNTER — Telehealth: Payer: Self-pay

## 2015-04-19 NOTE — Telephone Encounter (Signed)
Pre-Visit Call Completed with patient.

## 2015-04-20 ENCOUNTER — Encounter: Payer: Self-pay | Admitting: Internal Medicine

## 2015-04-20 ENCOUNTER — Ambulatory Visit (INDEPENDENT_AMBULATORY_CARE_PROVIDER_SITE_OTHER): Payer: Medicare Other | Admitting: Internal Medicine

## 2015-04-20 VITALS — BP 126/72 | HR 58 | Temp 98.0°F | Ht 66.0 in | Wt 167.0 lb

## 2015-04-20 DIAGNOSIS — Z0000 Encounter for general adult medical examination without abnormal findings: Secondary | ICD-10-CM | POA: Diagnosis not present

## 2015-04-20 DIAGNOSIS — Z09 Encounter for follow-up examination after completed treatment for conditions other than malignant neoplasm: Secondary | ICD-10-CM

## 2015-04-20 DIAGNOSIS — F341 Dysthymic disorder: Secondary | ICD-10-CM | POA: Diagnosis not present

## 2015-04-20 DIAGNOSIS — R1013 Epigastric pain: Secondary | ICD-10-CM

## 2015-04-20 MED ORDER — FLUOXETINE HCL 20 MG PO TABS: 20.0000 mg | ORAL_TABLET | Freq: Every day | ORAL | Status: DC

## 2015-04-20 NOTE — Progress Notes (Signed)
Pre visit review using our clinic review tool, if applicable. No additional management support is needed unless otherwise documented below in the visit note. 

## 2015-04-20 NOTE — Progress Notes (Signed)
Subjective:    Patient ID: [name], female    DOB: Oct 07, 1929, 81 y.o.   MRN: [id number]  DOS:  04/20/2015 Type of visit - description : Routine office visit Interval history: Depression: Start escitalopram, crying spells have decreased but she is getting very sleepy Was seen w/ dyspepsia few months ago, symptoms self resolved. Complain of hand numbness, bilateral, fingers affected, on and off, denies neck pain, symptoms are random, often times in the mornings. HTN: Good compliance of medications   Review of Systems  Constitutional: No fever. occ  chills. No unexplained wt changes. No unusual sweats  HEENT: No dental problems, no ear discharge, no facial swelling, no voice changes. No eye discharge, no eye  redness , no  intolerance to light   Respiratory: No wheezing , no  difficulty breathing. No cough , no mucus production  Cardiovascular: No CP, no leg swelling , no  Palpitations  GI: no nausea, no vomiting, no diarrhea , no  abdominal pain.  No blood in the stools. No dysphagia, no odynophagia    Endocrine: No polyphagia, no polyuria , no polydipsia  GU: No dysuria, gross hematuria, difficulty urinating. No urinary urgency, no frequency.  Musculoskeletal: No joint swellings or unusual aches or pains  Skin: No change in the color of the skin, palor , no  Rash  Allergic, immunologic: No environmental allergies , no  food allergies  Neurological: No dizziness no  syncope. No headaches. No diplopia, no slurred, no slurred speech, no motor deficits, no facial  Numbness  Hematological: No enlarged lymph nodes, no easy bruising , no unusual bleedings  Psychiatry: No suicidal ideas, no hallucinations, no beavior problems, no confusion.  No unusual/severe anxiety, no depression  Past Medical History  Diagnosis Date  . Headache(784.0)     migraines  . Hyperlipidemia   . Osteoporosis   . CVA (cerebral infarction)   . Hemorrhoids   . Hyperplastic colon polyp  06/2001  . Diverticulitis   . Varicose vein   . DVT (deep venous thrombosis) ([place])     after venous ablation, R leg  . Fracture 09/10/07    L2, status post vertebroplasty of L2 performed by IR  . Cardiomyopathy     Probable Takotsubo, severe CP w/ normal cath in 1994. Severe CP in 2003 w/ widespread T wave inversions on ECG. Cath w/ minimal coronary disease but LV-gram showed periapical severe hypokinesis and basilar hyperkinesia (EF 40%). Last echo in 4/09 confirmed full LV functional recovery with EF 60%, no regional wall motion abnormalities, mild to moderate LVH.  . OSA (obstructive sleep apnea) 03-29-11    no cpap used, not a problem now.  Marland Kitchen CAD (coronary artery disease)     mild nonobstructive disease on cath in 2003  . Hypertension   . GERD (gastroesophageal reflux disease)   . Hiatal hernia 03-29-11    no nerve problems  . Depression with anxiety 03-29-11    lost husband 3'09  . OA (osteoarthritis) of knee 03-29-11    w/ bilateral knee pain-not a problem now  . Stroke [facility]) 03-29-11    CVA x2 -last 10'12-?TIA(visual problems)  . E. coli gastroenteritis 03-29-11    8'10  . Cancer ([place])   . Polycythemia   . Otalgia of both ears     Dr. Simeon Craft    Past Surgical History  Procedure Laterality Date  . Appendectomy    . Ovarian cyst surgery      left  . Tonsillectomy    .  Back surgery    . Tubal ligation    . Dental extraction      L maxillary molar  . Kyphosis surgery  08/2007    cement used  . Total knee arthroplasty  06/2010    left  . Cholecystectomy  04/09/2011    Procedure: LAPAROSCOPIC CHOLECYSTECTOMY WITH INTRAOPERATIVE CHOLANGIOGRAM;  Surgeon: Odis Hollingshead, MD;  Location: [facility];  Service: General;  Laterality: N/A;  . Breast biopsy    . Breast cyst excision    . Breast lumpectomy      left stage I left breast cancer    Social History   Social History  . Marital Status: Widowed    Spouse Name: N/A  . Number of Children: 3  . Years of Education: N/A     Occupational History  . n/a    Social History Main Topics  . Smoking status: Former Smoker -- 1.00 packs/day for 37 years    Types: Cigarettes    Start date: 06/26/1951    Quit date: 05/14/1988  . Smokeless tobacco: Never Used     Comment: quit 25 years ago  . Alcohol Use: 0.6 oz/week    1 Cans of beer per week     Comment: occasional/social  . Drug Use: No  . Sexual Activity: No   Other Topics Concern  . Not on file   Social History Narrative   Lost husband 07/29/07-    Lives in [place] in a town house by herself,daughter moved out ~ 03-2015   Still drives                  Family History  Problem Relation Age of Onset  . Colon cancer Other     cousin  . Breast cancer Mother   . Breast cancer Sister   . Heart disease Father     MIs  . Leukemia Brother     GM      Medication List       This list is accurate as of: 04/20/15 11:59 PM.  Always use your most recent med list.               acetaminophen-codeine 300-30 MG tablet  Commonly known as:  TYLENOL #3  Take 1 tablet by mouth every 8 (eight) hours as needed. For migraine     aspirin 81 MG tablet  Take 81 mg by mouth daily.     FLUoxetine 20 MG tablet  Commonly known as:  PROZAC  Take 1 tablet (20 mg total) by mouth daily.     hydrochlorothiazide 25 MG tablet  Commonly known as:  HYDRODIURIL  Take 1 tablet (25 mg total) by mouth daily as needed (swelling in the legs ).     hydroxyurea 500 MG capsule  Commonly known as:  HYDREA  TAKE ONE CAPSULE BY MOUTH EVERY OTHER DAY, MAY TAKE WITH FOOD TO MINIMIZE SIDE EFFECTS     lansoprazole 15 MG capsule  Commonly known as:  PREVACID  Take 15 mg by mouth daily at 12 noon.     letrozole 2.5 MG tablet  Commonly known as:  FEMARA  Take 1 tablet (2.5 mg total) by mouth daily.     LORazepam 0.5 MG tablet  Commonly known as:  ATIVAN  1 tablet in the morning, 2 tablets at bedtime as needed     metoprolol 50 MG tablet  Commonly known as:   LOPRESSOR  Take 25 mg by mouth 2 (  two) times daily.     PROBIOTIC DAILY PO  Take by mouth every morning.     simethicone 125 MG chewable tablet  Commonly known as:  MYLICON  Chew 0000000 mg by mouth every 6 (six) hours as needed for flatulence.     Vitamin D 2000 UNITS tablet  Take 2,000 Units by mouth daily.     vitamin E 400 UNIT capsule  Take 400 Units by mouth daily.           Objective:   Physical Exam BP 126/72 mmHg  Pulse 58  Temp(Src) 98 F (36.7 C) (Oral)  Ht 5\' 6"  (1.676 m)  Wt 167 lb (75.751 kg)  BMI 26.97 kg/m2  SpO2 97%  General:   Well developed, well nourished . NAD.  Neck:  No thyromegaly HEENT:  Normocephalic . Face symmetric, atraumatic Lungs:  CTA B Normal respiratory effort, no intercostal retractions, no accessory muscle use. Heart: RRR,  no murmur.  No pretibial edema bilaterally  Abdomen:  Not distended, soft, non-tender. No rebound or rigidity.  Skin: Exposed areas without rash. Not pale. Not jaundice Neurologic:  alert & oriented X3.  Speech normal, gait appropriate for age and unassisted Strength symmetric and appropriate for age.  Psych: Cognition and judgment appear intact.  Cooperative with normal attention span and concentration.  Behavior appropriate. No anxious or depressed appearing. ( Seems better emotionally)    Assessment & Plan:   Assessment> Hypertension Dyslipidemia: Intolerant to most medicines including Zetia, Pravachol, Niaspan Depression, anxiety MSK: ---DJD : And pain medication as needed ---Osteoporosis:  T score -2.8 (02-2013): Has declined treatment ---L2 fractures, s/p vertebral plasty 2009 Migraine headaches GI: GERD, hiatal hernia, diverticulitis, Colon polyps  CV: ---Cardiomyopathy Probable Takotsubo cardiomyopathy: Severe chest pain with normal cath in 1994.  Severe chest pain in 2003 with widespread T wave inversions on ECG.  Cath with minimal coronary disease but LV-gram showed periapical severe  hypokinesis and basilar hyperkinesia (EF 40%).  Last echo in 4/09 confirmed full LV functional recovery with EF 60%, mild LVH. ---Stroke / TIA 2012 Sleep apnea 2012, no dilation now, not on CPAP H/oDVT, right leg after venous ablation H/o Breast cancer  PLAN Dyspepsia: Self resolved, never got to see GI Anxiety depression insomnia: Started citalopram, got very sleepy but it did work. Change to prozac, if needed, consider cymbalta, OTC 6 weeks HTN: Well-controlled, she actually is taking half metoprolol bid. Good compliance with other medications Dyslipidemia: On no medications. Unable to treat due to multiple intolerance Osteoporosis: Discussed results from 2014, risk of further fractures, she again declined treatment Primary care issues: Chart reviewed, recommend a Medicare wellness visit RTC 6 weeks

## 2015-04-20 NOTE — Assessment & Plan Note (Addendum)
Chart reviewed: Td 6-07 Pneumonia shot 2014 prevnar 03-19-14 zostavax-- previously discussed - decided not to take Had a flu shot Female care  No further cervical ca screening, see previous entries (denies  vag d/c, bleed, rash)  MM[facility]-- h/o  breast cancer , last [facility] 11-2014. Breast exam @ oncology Cscope 2003: polyps  Dr Fuller Plan,  repeated Cscope   07-2008 had mild diverticulosis

## 2015-04-20 NOTE — Patient Instructions (Signed)
Stop citalopram  Start fluoxetine 20 mg one tablet every day.  Please schedule an appointment to see Caryl Pina RN  For a Medicare wellness exam   Next visit  for a   routine checkup in 6 weeks  (15 minutes) Please schedule an appointment at the front desk

## 2015-04-21 NOTE — Assessment & Plan Note (Addendum)
Dyspepsia: Self resolved, never got to see GI Anxiety depression insomnia: Started citalopram, got very sleepy but it did work. Change to prozac, if needed, consider cymbalta, OTC 6 weeks HTN: Well-controlled, she actually is taking half metoprolol bid. Good compliance with other medications Dyslipidemia: On no medications. Unable to treat due to multiple intolerance Osteoporosis: Discussed results from 2014, risk of further fractures, she again declined treatment Primary care issues: Chart reviewed, recommend a Medicare wellness visit RTC 6 weeks

## 2015-04-25 DIAGNOSIS — M542 Cervicalgia: Secondary | ICD-10-CM | POA: Diagnosis not present

## 2015-05-04 ENCOUNTER — Encounter: Payer: Self-pay | Admitting: Family

## 2015-05-04 ENCOUNTER — Ambulatory Visit (INDEPENDENT_AMBULATORY_CARE_PROVIDER_SITE_OTHER): Payer: Medicare Other

## 2015-05-04 ENCOUNTER — Other Ambulatory Visit (HOSPITAL_BASED_OUTPATIENT_CLINIC_OR_DEPARTMENT_OTHER): Payer: Medicare Other

## 2015-05-04 ENCOUNTER — Ambulatory Visit (HOSPITAL_BASED_OUTPATIENT_CLINIC_OR_DEPARTMENT_OTHER): Payer: Medicare Other | Admitting: Family

## 2015-05-04 ENCOUNTER — Ambulatory Visit: Payer: Medicare Other

## 2015-05-04 VITALS — BP 139/64 | HR 67 | Temp 97.6°F | Resp 14 | Ht 66.0 in | Wt 167.0 lb

## 2015-05-04 VITALS — BP 124/50 | HR 68 | Ht 65.0 in | Wt 167.8 lb

## 2015-05-04 DIAGNOSIS — C50912 Malignant neoplasm of unspecified site of left female breast: Secondary | ICD-10-CM

## 2015-05-04 DIAGNOSIS — D45 Polycythemia vera: Secondary | ICD-10-CM

## 2015-05-04 DIAGNOSIS — D751 Secondary polycythemia: Secondary | ICD-10-CM

## 2015-05-04 DIAGNOSIS — Z0000 Encounter for general adult medical examination without abnormal findings: Secondary | ICD-10-CM | POA: Diagnosis not present

## 2015-05-04 LAB — COMPREHENSIVE METABOLIC PANEL
ALT: 10 U/L (ref 0–55)
AST: 18 U/L (ref 5–34)
Albumin: 3.8 g/dL (ref 3.5–5.0)
Alkaline Phosphatase: 82 U/L (ref 40–150)
Anion Gap: 7 mEq/L (ref 3–11)
BUN: 10.9 mg/dL (ref 7.0–26.0)
CO2: 26 mEq/L (ref 22–29)
Calcium: 9.4 mg/dL (ref 8.4–10.4)
Chloride: 103 mEq/L (ref 98–109)
Creatinine: 0.9 mg/dL (ref 0.6–1.1)
EGFR: 61 mL/min/{1.73_m2} — ABNORMAL LOW (ref >90)
Glucose: 97 mg/dl (ref 70–140)
Potassium: 4.6 mEq/L (ref 3.5–5.1)
Sodium: 136 mEq/L (ref 136–145)
Total Bilirubin: 1.29 mg/dL — ABNORMAL HIGH (ref 0.20–1.20)
Total Protein: 6.9 g/dL (ref 6.4–8.3)

## 2015-05-04 LAB — CBC WITH DIFFERENTIAL (CANCER CENTER ONLY)
BASO#: 0.1 10*3/uL (ref 0.0–0.2)
BASO%: 1.1 % (ref 0.0–2.0)
EOS%: 2.5 % (ref 0.0–7.0)
Eosinophils Absolute: 0.1 10*3/uL (ref 0.0–0.5)
HCT: 41.8 % (ref 34.8–46.6)
HGB: 12.6 g/dL (ref 11.6–15.9)
LYMPH#: 0.7 10*3/uL — ABNORMAL LOW (ref 0.9–3.3)
LYMPH%: 12.7 % — ABNORMAL LOW (ref 14.0–48.0)
MCH: 23.4 pg — ABNORMAL LOW (ref 26.0–34.0)
MCHC: 30.1 g/dL — ABNORMAL LOW (ref 32.0–36.0)
MCV: 78 fL — ABNORMAL LOW (ref 81–101)
MONO#: 0.5 10*3/uL (ref 0.1–0.9)
MONO%: 9.8 % (ref 0.0–13.0)
NEUT#: 3.9 10*3/uL (ref 1.5–6.5)
NEUT%: 73.9 % (ref 39.6–80.0)
Platelets: 370 10*3/uL (ref 145–400)
RBC: 5.39 10*6/uL — ABNORMAL HIGH (ref 3.70–5.32)
RDW: 17 % — ABNORMAL HIGH (ref 11.1–15.7)
WBC: 5.3 10*3/uL (ref 3.9–10.0)

## 2015-05-04 LAB — IRON AND TIBC
%SAT: 6 % — ABNORMAL LOW (ref 21–57)
Iron: 24 ug/dL — ABNORMAL LOW (ref 41–142)
TIBC: 406 ug/dL (ref 236–444)
UIBC: 382 ug/dL (ref 120–384)

## 2015-05-04 LAB — FERRITIN: Ferritin: 30 ng/ml (ref 9–269)

## 2015-05-04 LAB — LACTATE DEHYDROGENASE: LDH: 158 U/L (ref 125–245)

## 2015-05-04 NOTE — Progress Notes (Signed)
No phlebotomy per Sarah,NP. HCT 41.8.

## 2015-05-04 NOTE — Patient Instructions (Addendum)
Eat heart healthy diet (full of fruits, vegetables, whole grains, lean protein, water--limit salt, fat, caffeine and sugar intake) and increase physical activity as tolerated.  Continue doing brain stimulating activities (puzzles, reading, adult coloring books, staying active) to keep memory sharp.   Consider life alert or always have cell phone on you for emergencies.     Follow up with Dr. Larose Kells as scheduled.    Fat and Cholesterol Restricted Diet High levels of fat and cholesterol in your blood may lead to various health problems, such as diseases of the heart, blood vessels, gallbladder, liver, and pancreas. Fats are concentrated sources of energy that come in various forms. Certain types of fat, including saturated fat, may be harmful in excess. Cholesterol is a substance needed by your body in small amounts. Your body makes all the cholesterol it needs. Excess cholesterol comes from the food you eat. When you have high levels of cholesterol and saturated fat in your blood, health problems can develop because the excess fat and cholesterol will gather along the walls of your blood vessels, causing them to narrow. Choosing the right foods will help you control your intake of fat and cholesterol. This will help keep the levels of these substances in your blood within normal limits and reduce your risk of disease. WHAT IS MY PLAN? Your health care provider recommends that you:  Get no more than __________ % of the total calories in your daily diet from fat.  Limit your intake of saturated fat to less than ______% of your total calories each day.  Limit the amount of cholesterol in your diet to less than _________mg per day. WHAT TYPES OF FAT SHOULD I CHOOSE?  Choose healthy fats more often. Choose monounsaturated and polyunsaturated fats, such as olive and canola oil, flaxseeds, walnuts, almonds, and seeds.  Eat more omega-3 fats. Good choices include salmon, mackerel, sardines, tuna,  flaxseed oil, and ground flaxseeds. Aim to eat fish at least two times a week.  Limit saturated fats. Saturated fats are primarily found in animal products, such as meats, butter, and cream. Plant sources of saturated fats include palm oil, palm kernel oil, and coconut oil.  Avoid foods with partially hydrogenated oils in them. These contain trans fats. Examples of foods that contain trans fats are stick margarine, some tub margarines, cookies, crackers, and other baked goods. WHAT GENERAL GUIDELINES DO I NEED TO FOLLOW? These guidelines for healthy eating will help you control your intake of fat and cholesterol:  Check food labels carefully to identify foods with trans fats or high amounts of saturated fat.  Fill one half of your plate with vegetables and green salads.  Fill one fourth of your plate with whole grains. Look for the word "whole" as the first word in the ingredient list.  Fill one fourth of your plate with lean protein foods.  Limit fruit to two servings a day. Choose fruit instead of juice.  Eat more foods that contain soluble fiber. Examples of foods that contain this type of fiber are apples, broccoli, carrots, beans, peas, and barley. Aim to get 20-30 g of fiber per day.  Eat more home-cooked food and less restaurant, buffet, and fast food.  Limit or avoid alcohol.  Limit foods high in starch and sugar.  Limit fried foods.  Cook foods using methods other than frying. Baking, boiling, grilling, and broiling are all great options.  Lose weight if you are overweight. Losing just 5-10% of your initial body weight can help  your overall health and prevent diseases such as diabetes and heart disease. WHAT FOODS CAN I EAT? Grains Whole grains, such as whole wheat or whole grain breads, crackers, cereals, and pasta. Unsweetened oatmeal, bulgur, barley, quinoa, or brown rice. Corn or whole wheat flour tortillas. Vegetables Fresh or frozen vegetables (raw, steamed, roasted,  or grilled). Green salads. Fruits All fresh, canned (in natural juice), or frozen fruits. Meat and Other Protein Products Ground beef (85% or leaner), grass-fed beef, or beef trimmed of fat. Skinless chicken or [place]. Ground chicken or [place]. Pork trimmed of fat. All fish and seafood. Eggs. Dried beans, peas, or lentils. Unsalted nuts or seeds. Unsalted canned or dry beans. Dairy Low-fat dairy products, such as skim or 1% milk, 2% or reduced-fat cheeses, low-fat ricotta or cottage cheese, or plain low-fat yogurt. Fats and Oils Tub margarines without trans fats. Light or reduced-fat mayonnaise and salad dressings. Avocado. Olive, canola, sesame, or safflower oils. Natural peanut or almond butter (choose ones without added sugar and oil). The items listed above may not be a complete list of recommended foods or beverages. Contact your dietitian for more options. WHAT FOODS ARE NOT RECOMMENDED? Grains White bread. White pasta. White rice. Cornbread. Bagels, pastries, and croissants. Crackers that contain trans fat. Vegetables White potatoes. Corn. Creamed or fried vegetables. Vegetables in a cheese sauce. Fruits Dried fruits. Canned fruit in light or heavy syrup. Fruit juice. Meat and Other Protein Products Fatty cuts of meat. Ribs, chicken wings, bacon, sausage, bologna, salami, chitterlings, fatback, hot dogs, bratwurst, and packaged luncheon meats. Liver and organ meats. Dairy Whole or 2% milk, cream, half-and-half, and cream cheese. Whole milk cheeses. Whole-fat or sweetened yogurt. Full-fat cheeses. Nondairy creamers and whipped toppings. Processed cheese, cheese spreads, or cheese curds. Sweets and Desserts Corn syrup, sugars, honey, and molasses. Candy. Jam and jelly. Syrup. Sweetened cereals. Cookies, pies, cakes, donuts, muffins, and ice cream. Fats and Oils Butter, stick margarine, lard, shortening, ghee, or bacon fat. Coconut, palm kernel, or palm oils. Beverages Alcohol.  Sweetened drinks (such as sodas, lemonade, and fruit drinks or punches). The items listed above may not be a complete list of foods and beverages to avoid. Contact your dietitian for more information.   This information is not intended to replace advice given to you by your health care provider. Make sure you discuss any questions you have with your health care provider.   Document Released: 04/30/2005 Document Revised: 05/21/2014 Document Reviewed: 07/29/2013 Elsevier Interactive Patient Education 2016 [employer]  Fat and Cholesterol Restricted Diet Getting too much fat and cholesterol in your diet may cause health problems. Following this diet helps keep your fat and cholesterol at normal levels. This can keep you from getting sick. WHAT TYPES OF FAT SHOULD I CHOOSE?  Choose monosaturated and polyunsaturated fats. These are found in foods such as olive oil, canola oil, flaxseeds, walnuts, almonds, and seeds.  Eat more omega-3 fats. Good choices include salmon, mackerel, sardines, tuna, flaxseed oil, and ground flaxseeds.  Limit saturated fats. These are in animal products such as meats, butter, and cream. They can also be in plant products such as palm oil, palm kernel oil, and coconut oil.   Avoid foods with partially hydrogenated oils in them. These contain trans fats. Examples of foods that have trans fats are stick margarine, some tub margarines, cookies, crackers, and other baked goods. WHAT GENERAL GUIDELINES DO I NEED TO FOLLOW?   Check food labels. Look for the words "trans fat" and "saturated fat."  When preparing a meal:  Fill half of your plate with vegetables and green salads.  Fill one fourth of your plate with whole grains. Look for the word "whole" as the first word in the ingredient list.  Fill one fourth of your plate with lean protein foods.  Limit fruit to two servings a day. Choose fruit instead of juice.  Eat more foods with soluble fiber. Examples of foods  with this type of fiber are apples, broccoli, carrots, beans, peas, and barley. Try to get 20-30 g (grams) of fiber per day.  Eat more home-cooked foods. Eat less at restaurants and buffets.  Limit or avoid alcohol.  Limit foods high in starch and sugar.  Limit fried foods.  Cook foods without frying them. Baking, boiling, grilling, and broiling are all great options.  Lose weight if you are overweight. Losing even a small amount of weight can help your overall health. It can also help prevent diseases such as diabetes and heart disease. WHAT FOODS CAN I EAT? Grains Whole grains, such as whole wheat or whole grain breads, crackers, cereals, and pasta. Unsweetened oatmeal, bulgur, barley, quinoa, or brown rice. Corn or whole wheat flour tortillas. Vegetables Fresh or frozen vegetables (raw, steamed, roasted, or grilled). Green salads. Fruits All fresh, canned (in natural juice), or frozen fruits. Meat and Other Protein Products Ground beef (85% or leaner), grass-fed beef, or beef trimmed of fat. Skinless chicken or [place]. Ground chicken or [place]. Pork trimmed of fat. All fish and seafood. Eggs. Dried beans, peas, or lentils. Unsalted nuts or seeds. Unsalted canned or dry beans. Dairy Low-fat dairy products, such as skim or 1% milk, 2% or reduced-fat cheeses, low-fat ricotta or cottage cheese, or plain low-fat yogurt. Fats and Oils Tub margarines without trans fats. Light or reduced-fat mayonnaise and salad dressings. Avocado. Olive, canola, sesame, or safflower oils. Natural peanut or almond butter (choose ones without added sugar and oil). The items listed above may not be a complete list of recommended foods or beverages. Contact your dietitian for more options. WHAT FOODS ARE NOT RECOMMENDED? Grains White bread. White pasta. White rice. Cornbread. Bagels, pastries, and croissants. Crackers that contain trans fat. Vegetables White potatoes. Corn. Creamed or fried vegetables.  Vegetables in a cheese sauce. Fruits Dried fruits. Canned fruit in light or heavy syrup. Fruit juice. Meat and Other Protein Products Fatty cuts of meat. Ribs, chicken wings, bacon, sausage, bologna, salami, chitterlings, fatback, hot dogs, bratwurst, and packaged luncheon meats. Liver and organ meats. Dairy Whole or 2% milk, cream, half-and-half, and cream cheese. Whole milk cheeses. Whole-fat or sweetened yogurt. Full-fat cheeses. Nondairy creamers and whipped toppings. Processed cheese, cheese spreads, or cheese curds. Sweets and Desserts Corn syrup, sugars, honey, and molasses. Candy. Jam and jelly. Syrup. Sweetened cereals. Cookies, pies, cakes, donuts, muffins, and ice cream. Fats and Oils Butter, stick margarine, lard, shortening, ghee, or bacon fat. Coconut, palm kernel, or palm oils. Beverages Alcohol. Sweetened drinks (such as sodas, lemonade, and fruit drinks or punches). The items listed above may not be a complete list of foods and beverages to avoid. Contact your dietitian for more information.   This information is not intended to replace advice given to you by your health care provider. Make sure you discuss any questions you have with your health care provider.   Document Released: 10/30/2011 Document Revised: 05/21/2014 Document Reviewed: 07/30/2013 Elsevier Interactive Patient Education 2016 [place] in the [place]  Falls can cause injuries. They can happen  to people of all ages. There are many things you can do to make your home safe and to help prevent falls.  WHAT CAN I DO ON THE OUTSIDE OF MY HOME?  Regularly fix the edges of walkways and driveways and fix any cracks.  Remove anything that might make you trip as you walk through a door, such as a raised step or threshold.  Trim any bushes or trees on the path to your home.  Use bright outdoor lighting.  Clear any walking paths of anything that might make someone trip, such as rocks or  tools.  Regularly check to see if handrails are loose or broken. Make sure that both sides of any steps have handrails.  Any raised decks and porches should have guardrails on the edges.  Have any leaves, snow, or ice cleared regularly.  Use sand or salt on walking paths during winter.  Clean up any spills in your garage right away. This includes oil or grease spills. WHAT CAN I DO IN THE BATHROOM?   Use night lights.  Install grab bars by the toilet and in the tub and shower. Do not use towel bars as grab bars.  Use non-skid mats or decals in the tub or shower.  If you need to sit down in the shower, use a plastic, non-slip stool.  Keep the floor dry. Clean up any water that spills on the floor as soon as it happens.  Remove soap buildup in the tub or shower regularly.  Attach bath mats securely with double-sided non-slip rug tape.  Do not have throw rugs and other things on the floor that can make you trip. WHAT CAN I DO IN THE BEDROOM?  Use night lights.  Make sure that you have a light by your bed that is easy to reach.  Do not use any sheets or blankets that are too big for your bed. They should not hang down onto the floor.  Have a firm chair that has side arms. You can use this for support while you get dressed.  Do not have throw rugs and other things on the floor that can make you trip. WHAT CAN I DO IN THE KITCHEN?  Clean up any spills right away.  Avoid walking on wet floors.  Keep items that you use a lot in easy-to-reach places.  If you need to reach something above you, use a strong step stool that has a grab bar.  Keep electrical cords out of the way.  Do not use floor polish or wax that makes floors slippery. If you must use wax, use non-skid floor wax.  Do not have throw rugs and other things on the floor that can make you trip. WHAT CAN I DO WITH MY STAIRS?  Do not leave any items on the stairs.  Make sure that there are handrails on both  sides of the stairs and use them. Fix handrails that are broken or loose. Make sure that handrails are as long as the stairways.  Check any carpeting to make sure that it is firmly attached to the stairs. Fix any carpet that is loose or worn.  Avoid having throw rugs at the top or bottom of the stairs. If you do have throw rugs, attach them to the floor with carpet tape.  Make sure that you have a light switch at the top of the stairs and the bottom of the stairs. If you do not have them, ask someone to add them for  you. WHAT ELSE CAN I DO TO HELP PREVENT FALLS?  Wear shoes that:  Do not have high heels.  Have rubber bottoms.  Are comfortable and fit you well.  Are closed at the toe. Do not wear sandals.  If you use a stepladder:  Make sure that it is fully opened. Do not climb a closed stepladder.  Make sure that both sides of the stepladder are locked into place.  Ask someone to hold it for you, if possible.  Clearly mark and make sure that you can see:  Any grab bars or handrails.  First and last steps.  Where the edge of each step is.  Use tools that help you move around (mobility aids) if they are needed. These include:  Canes.  Walkers.  Scooters.  Crutches.  Turn on the lights when you go into a dark area. Replace any light bulbs as soon as they burn out.  Set up your furniture so you have a clear path. Avoid moving your furniture around.  If any of your floors are uneven, fix them.  If there are any pets around you, be aware of where they are.  Review your medicines with your doctor. Some medicines can make you feel dizzy. This can increase your chance of falling. Ask your doctor what other things that you can do to help prevent falls.   This information is not intended to replace advice given to you by your health care provider. Make sure you discuss any questions you have with your health care provider.   Document Released: 02/24/2009 Document  Revised: 09/14/2014 Document Reviewed: 06/04/2014 Elsevier Interactive Patient Education [employer].
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
## 2015-05-04 NOTE — Progress Notes (Signed)
Subjective:   [name] is a 81 y.o. female who presents for Medicare Annual (Subsequent) preventive examination.  Review of Systems:  No ROS Cardiac Risk Factors include: advanced age (>73men, >11 women);hypertension (CAD and cardiomyopathy)  Sleep patterns:  Sleeps approximately 6 1/2- 7 hours per night/wakes at least once during the night to void.   Home Safety/Smoke Alarms:  Feels safe at home.  Lives alone with 2 dogs.  Smoke alarm present.   Firearm Safety:  No firearms.  Seat Belt Safety/Bike Helmet:  Always wears seat belt. Sun Exposure:  Discussed the importance of sunblock    Counseling:   Eye Exam- 06/2014 Dental-  Goes every 6 months  Female:  Mammo-11/18/14; does not plan to repeat    Dexa scan-02/23/13-osteoporosis      CCS-07/14/08-normal; no repeat     Objective:     Vitals: BP 124/50 mmHg  Pulse 68  Ht 5\' 5"  (1.651 m)  Wt 167 lb 12.8 oz (76.114 kg)  BMI 27.92 kg/m2  SpO2 95%  Tobacco History  Smoking status  . Former Smoker -- 1.00 packs/day for 37 years  . Types: Cigarettes  . Start date: 06/26/1951  . Quit date: 05/14/1988  Smokeless tobacco  . Never Used    Comment: quit 25 years ago     Counseling given: No   Past Medical History  Diagnosis Date  . Headache(784.0)     migraines  . Hyperlipidemia   . Osteoporosis   . CVA (cerebral infarction)   . Hemorrhoids   . Hyperplastic colon polyp 06/2001  . Diverticulitis   . Varicose vein   . DVT (deep venous thrombosis) ([place])     after venous ablation, R leg  . Fracture 09/10/07    L2, status post vertebroplasty of L2 performed by IR  . Cardiomyopathy     Probable Takotsubo, severe CP w/ normal cath in 1994. Severe CP in 2003 w/ widespread T wave inversions on ECG. Cath w/ minimal coronary disease but LV-gram showed periapical severe hypokinesis and basilar hyperkinesia (EF 40%). Last echo in 4/09 confirmed full LV functional recovery with EF 60%, no regional wall motion abnormalities, mild to  moderate LVH.  . OSA (obstructive sleep apnea) 03-29-11    no cpap used, not a problem now.  Marland Kitchen CAD (coronary artery disease)     mild nonobstructive disease on cath in 2003  . Hypertension   . GERD (gastroesophageal reflux disease)   . Hiatal hernia 03-29-11    no nerve problems  . Depression with anxiety 03-29-11    lost husband 3'09  . OA (osteoarthritis) of knee 03-29-11    w/ bilateral knee pain-not a problem now  . Stroke [facility]) 03-29-11    CVA x2 -last 10'12-?TIA(visual problems)  . E. coli gastroenteritis 03-29-11    8'10  . Cancer ([place])   . Polycythemia   . Otalgia of both ears     Dr. Simeon Craft   Past Surgical History  Procedure Laterality Date  . Appendectomy    . Ovarian cyst surgery      left  . Tonsillectomy    . Back surgery    . Tubal ligation    . Dental extraction      L maxillary molar  . Kyphosis surgery  08/2007    cement used  . Total knee arthroplasty  06/2010    left  . Cholecystectomy  04/09/2011    Procedure: LAPAROSCOPIC CHOLECYSTECTOMY WITH INTRAOPERATIVE CHOLANGIOGRAM;  Surgeon: Odis Hollingshead,  MD;  Location: [facility];  Service: General;  Laterality: N/A;  . Breast biopsy    . Breast cyst excision    . Breast lumpectomy      left stage I left breast cancer   Family History  Problem Relation Age of Onset  . Colon cancer Other     cousin  . Breast cancer Mother   . Breast cancer Sister   . Heart disease Father     MIs  . Leukemia Brother     GM   History  Sexual Activity  . Sexual Activity: No    Outpatient Encounter Prescriptions as of 05/04/2015  Medication Sig  . acetaminophen-codeine (TYLENOL #3) 300-30 MG per tablet Take 1 tablet by mouth every 8 (eight) hours as needed. For migraine  . aspirin 81 MG tablet Take 81 mg by mouth daily.  . Cholecalciferol (VITAMIN D) 2000 UNITS tablet Take 2,000 Units by mouth daily.  . hydrochlorothiazide (HYDRODIURIL) 25 MG tablet Take 1 tablet (25 mg total) by mouth daily as needed (swelling in  the legs ).  . hydroxyurea (HYDREA) 500 MG capsule TAKE ONE CAPSULE BY MOUTH EVERY OTHER DAY, MAY TAKE WITH FOOD TO MINIMIZE SIDE EFFECTS  . lansoprazole (PREVACID) 15 MG capsule Take 15 mg by mouth daily at 12 noon.  Marland Kitchen letrozole (FEMARA) 2.5 MG tablet Take 1 tablet (2.5 mg total) by mouth daily.  Marland Kitchen LORazepam (ATIVAN) 0.5 MG tablet 1 tablet in the morning, 2 tablets at bedtime as needed  . metoprolol (LOPRESSOR) 50 MG tablet Take 25 mg by mouth 2 (two) times daily.  . Probiotic Product (PROBIOTIC DAILY PO) Take by mouth every morning. Reported on 05/04/2015  . simethicone (MYLICON) 0000000 MG chewable tablet Chew 125 mg by mouth every 6 (six) hours as needed for flatulence.  . vitamin E 400 UNIT capsule Take 400 Units by mouth daily.   Marland Kitchen FLUoxetine (PROZAC) 20 MG tablet Take 1 tablet (20 mg total) by mouth daily. (Patient not taking: Reported on 05/04/2015)   No facility-administered encounter medications on file as of 05/04/2015.    Activities of Daily Living In your present state of health, do you have any difficulty performing the following activities: 05/04/2015 02/15/2015  Hearing? N N  Vision? N N  Difficulty concentrating or making decisions? N N  Walking or climbing stairs? N N  Dressing or bathing? N N  Doing errands, shopping? N N  Preparing Food and eating ? N -  Using the Toilet? N -  In the past six months, have you accidently leaked urine? N -  Do you have problems with loss of bowel control? N -  Managing your Medications? N -  Managing your Finances? N -  Housekeeping or managing your Housekeeping? N -    Patient Care Team: Colon Branch, MD as PCP - General Troy Sine, MD as Consulting Physician (Cardiology) Eliezer Bottom, NP as Nurse Practitioner (Nurse Practitioner) Volanda Napoleon, MD as Consulting Physician (Oncology) Ruby Cola, MD as Consulting Physician (Otolaryngology) Crista Luria, MD as Consulting Physician (Dermatology) Ladene Artist, MD as  Consulting Physician (Gastroenterology)    Assessment:   Exercise Activities and Dietary recommendations Current Exercise Habits:: Structured exercise class (active with dogs and housekeeping), Type of exercise: Other - see comments (Swimming), Time (Minutes): 60, Frequency (Times/Week): 2, Weekly Exercise (Minutes/Week): 120  Diet:  Breakfast- yogurt and pop tart; Lunch- frozen dinners or sandwiches, Dinner-1/2 sandwich, yogurt; Eats one large meal per day and  rest snacks. Drinks ginger ale, tea, and 6-8 cups 1/2 and 1/2 coffee per day.      Goals    . Increase socialization     Join card club       Fall Risk Fall Risk  05/04/2015 05/04/2015 02/15/2015 02/02/2015 11/12/2014  Falls in the past year? No No No No No  Number falls in past yr: - - - - -  Injury with Fall? - - - - -  Risk Factor Category  - - - - -  Risk for fall due to : Impaired balance/gait;History of fall(s);Impaired vision - - - -  Risk for fall due to (comments): Fall Prevention Discussed.   - - - -   Depression Screen PHQ 2/9 Scores 05/04/2015 02/15/2015 11/12/2014 05/27/2013  PHQ - 2 Score 0 0 0 0     Cognitive Testing MMSE - Mini Mental State Exam 05/04/2015  Orientation to time 5  Orientation to Place 5  Registration 3  Attention/ Calculation 5  Recall 3  Language- name 2 objects 2  Language- repeat 1  Language- follow 3 step command 3  Language- read & follow direction 1  Write a sentence 1  Copy design 1  Total score 30    Immunization History  Administered Date(s) Administered  . Influenza Split 03/21/2011  . Influenza, High Dose Seasonal PF 02/15/2015  . Influenza,inj,Quad PF,36+ Mos 03/19/2014  . Pneumococcal Conjugate-13 03/19/2014  . Pneumococcal Polysaccharide-23 08/18/2012  . Td 10/12/2005   Screening Tests Health Maintenance  Topic Date Due  . ZOSTAVAX  02/15/2016 (Originally 08/05/1991)  . TETANUS/TDAP  10/13/2015  . INFLUENZA VACCINE  12/13/2015  . DEXA SCAN  Completed  . PNA vac  Low Risk Adult  Completed      Plan:  Eat heart healthy diet (full of fruits, vegetables, whole grains, lean protein, water--limit salt, fat, caffeine and sugar intake) and increase physical activity as tolerated.  Continue doing brain stimulating activities (puzzles, reading, adult coloring books, staying active) to keep memory sharp.   Consider life alert or always have cell phone on you for emergencies.     Follow up with Dr. Larose Kells as scheduled.   During the course of the visit the patient was educated and counseled about the following appropriate screening and preventive services:   Vaccines to include Pneumoccal, Influenza, Hepatitis B, Td, Zostavax, HCV  Electrocardiogram  Cardiovascular Disease  Colorectal cancer screening  Bone density screening  Diabetes screening  Glaucoma screening  Mammography/PAP  Nutrition counseling   Patient Instructions (the written plan) was given to the patient.   Rudene Anda, RN  05/04/2015  Agree, French Ana MD

## 2015-05-04 NOTE — Progress Notes (Signed)
Pre visit review using our clinic review tool, if applicable. No additional management support is needed unless otherwise documented below in the visit note. 

## 2015-06-01 ENCOUNTER — Encounter: Payer: Self-pay | Admitting: Internal Medicine

## 2015-06-01 ENCOUNTER — Ambulatory Visit (INDEPENDENT_AMBULATORY_CARE_PROVIDER_SITE_OTHER): Payer: Medicare Other | Admitting: Internal Medicine

## 2015-06-01 VITALS — BP 122/78 | HR 65 | Temp 98.0°F | Ht 66.0 in | Wt 167.1 lb

## 2015-06-01 DIAGNOSIS — R109 Unspecified abdominal pain: Secondary | ICD-10-CM | POA: Diagnosis not present

## 2015-06-01 DIAGNOSIS — K219 Gastro-esophageal reflux disease without esophagitis: Secondary | ICD-10-CM | POA: Diagnosis not present

## 2015-06-01 DIAGNOSIS — F341 Dysthymic disorder: Secondary | ICD-10-CM

## 2015-06-01 DIAGNOSIS — Z79899 Other long term (current) drug therapy: Secondary | ICD-10-CM | POA: Diagnosis not present

## 2015-06-01 LAB — URINALYSIS, ROUTINE W REFLEX MICROSCOPIC
Bilirubin Urine: NEGATIVE
Hgb urine dipstick: NEGATIVE
Ketones, ur: NEGATIVE
Leukocytes, UA: NEGATIVE
Nitrite: NEGATIVE
RBC / HPF: NONE SEEN (ref 0–?)
Specific Gravity, Urine: <=1.005 — AB (ref 1.000–1.030)
Total Protein, Urine: NEGATIVE
Urine Glucose: NEGATIVE
Urobilinogen, UA: 0.2 (ref 0.0–1.0)
WBC, UA: NONE SEEN (ref 0–?)
pH: 6 (ref 5.0–8.0)

## 2015-06-01 NOTE — Progress Notes (Signed)
Subjective:    Patient ID: [name], female    DOB: 11-17-1929, 82 y.o.   MRN: [id number]  DOS:  06/01/2015 Type of visit - description : Follow-up previous visit Interval history: She tried Prozac as recommended, developed diarrhea and self discontinued the medication. Currently, is doing well with Ativan as needed. Also, 3 weeks ago had  left abdominal pain, self prescribed Cipro, few tablets and now is better. Would like a urinalysis. GERD: Was intolerant to Prevacid, back on omeprazole and doing well   Review of Systems No fever chills No actual dysuria, gross hematuria or difficulty urinating. No further abdominal pain  Past Medical History  Diagnosis Date  . Headache(784.0)     migraines  . Hyperlipidemia   . Osteoporosis   . CVA (cerebral infarction)   . Hemorrhoids   . Hyperplastic colon polyp 06/2001  . Diverticulitis   . Varicose vein   . DVT (deep venous thrombosis) ([place])     after venous ablation, R leg  . Fracture 09/10/07    L2, status post vertebroplasty of L2 performed by IR  . Cardiomyopathy     Probable Takotsubo, severe CP w/ normal cath in 1994. Severe CP in 2003 w/ widespread T wave inversions on ECG. Cath w/ minimal coronary disease but LV-gram showed periapical severe hypokinesis and basilar hyperkinesia (EF 40%). Last echo in 4/09 confirmed full LV functional recovery with EF 60%, no regional wall motion abnormalities, mild to moderate LVH.  . OSA (obstructive sleep apnea) 03-29-11    no cpap used, not a problem now.  Marland Kitchen CAD (coronary artery disease)     mild nonobstructive disease on cath in 2003  . Hypertension   . GERD (gastroesophageal reflux disease)   . Hiatal hernia 03-29-11    no nerve problems  . Depression with anxiety 03-29-11    lost husband 3'09  . OA (osteoarthritis) of knee 03-29-11    w/ bilateral knee pain-not a problem now  . Stroke [facility]) 03-29-11    CVA x2 -last 10'12-?TIA(visual problems)  . E. coli gastroenteritis  03-29-11    8'10  . Cancer ([place])   . Polycythemia   . Otalgia of both ears     Dr. Simeon Craft    Past Surgical History  Procedure Laterality Date  . Appendectomy    . Ovarian cyst surgery      left  . Tonsillectomy    . Back surgery    . Tubal ligation    . Dental extraction      L maxillary molar  . Kyphosis surgery  08/2007    cement used  . Total knee arthroplasty  06/2010    left  . Cholecystectomy  04/09/2011    Procedure: LAPAROSCOPIC CHOLECYSTECTOMY WITH INTRAOPERATIVE CHOLANGIOGRAM;  Surgeon: Odis Hollingshead, MD;  Location: [facility];  Service: General;  Laterality: N/A;  . Breast biopsy    . Breast cyst excision    . Breast lumpectomy      left stage I left breast cancer    Social History   Social History  . Marital Status: Widowed    Spouse Name: N/A  . Number of Children: 3  . Years of Education: N/A   Occupational History  . n/a    Social History Main Topics  . Smoking status: Former Smoker -- 1.00 packs/day for 37 years    Types: Cigarettes    Start date: 06/26/1951    Quit date: 05/14/1988  . Smokeless tobacco: Never Used  Comment: quit 25 years ago  . Alcohol Use: 0.6 oz/week    1 Cans of beer per week     Comment: occasional/social  . Drug Use: No  . Sexual Activity: No   Other Topics Concern  . Not on file   Social History Narrative   Lost husband 07/29/07-    Lives in [place] in a town house by herself,daughter moved out ~ 03-2015   Still drives                     Medication List       This list is accurate as of: 06/01/15 11:59 PM.  Always use your most recent med list.               acetaminophen-codeine 300-30 MG tablet  Commonly known as:  TYLENOL #3  Take 1 tablet by mouth every 8 (eight) hours as needed. For migraine     aspirin 81 MG tablet  Take 81 mg by mouth daily.     hydrochlorothiazide 25 MG tablet  Commonly known as:  HYDRODIURIL  Take 1 tablet (25 mg total) by mouth daily as needed (swelling in  the legs ).     hydroxyurea 500 MG capsule  Commonly known as:  HYDREA  TAKE ONE CAPSULE BY MOUTH EVERY OTHER DAY, MAY TAKE WITH FOOD TO MINIMIZE SIDE EFFECTS     letrozole 2.5 MG tablet  Commonly known as:  FEMARA  Take 1 tablet (2.5 mg total) by mouth daily.     LORazepam 0.5 MG tablet  Commonly known as:  ATIVAN  1 tablet in the morning, 2 tablets at bedtime as needed     metoprolol 50 MG tablet  Commonly known as:  LOPRESSOR  Take 25 mg by mouth 2 (two) times daily.     omeprazole 20 MG capsule  Commonly known as:  PRILOSEC  Take 20 mg by mouth daily.     PROBIOTIC DAILY PO  Take by mouth every morning. Reported on 06/01/2015     simethicone 125 MG chewable tablet  Commonly known as:  MYLICON  Chew 0000000 mg by mouth every 6 (six) hours as needed for flatulence.     Vitamin D 2000 units tablet  Take 2,000 Units by mouth daily.     vitamin E 400 UNIT capsule  Take 400 Units by mouth daily.           Objective:   Physical Exam BP 122/78 mmHg  Pulse 65  Temp(Src) 98 F (36.7 C) (Oral)  Ht 5\' 6"  (1.676 m)  Wt 167 lb 2 oz (75.807 kg)  BMI 26.99 kg/m2  SpO2 98% General:   Well developed, well nourished . NAD.  HEENT:  Normocephalic . Face symmetric, atraumatic Lungs:  CTA B Normal respiratory effort, no intercostal retractions, no accessory muscle use. Heart: RRR,  no murmur.  no pretibial edema bilaterally  Abdomen:  Not distended, soft, non-tender. No rebound or rigidity. No mass,organomegaly Skin: Not pale. Not jaundice Neurologic:  alert & oriented X3.  Speech normal, gait appropriate for age and unassisted Psych--  Cognition and judgment appear intact.  Cooperative with normal attention span and concentration.  Behavior appropriate. No anxious or depressed appearing.     Assessment & Plan:   Assessment> HTN Dyslipidemia: Intolerant to most medicines including Zetia, Pravachol, Niaspan Depression, anxiety (03-2015 citalopram cause drowsiness,  04-2015 prozac diarrhea) POLYCYTEMIA-- sees hem-onc MSK: ---DJD :  pain medication as needed ---  Osteoporosis:  T score -2.8 (02-2013):   declined treatment 04-2015 ---L2 fractures, s/p vertebral plasty 2009 Migraine headaches GI: GERD- intolerant to prevacid, visual disturbances HH, diverticulitis, Colon polyps  CV: ---Cardiomyopathy Probable Takotsubo cardiomyopathy: Severe chest pain with normal cath in 1994.  Severe chest pain in 2003 with widespread T wave inversions on ECG.  Cath with minimal coronary disease but LV-gram showed periapical severe hypokinesis and basilar hyperkinesia (EF 40%).  Last echo in 4/09 confirmed full LV functional recovery with EF 60%, mild LVH. ---Stroke / TIA 2012 Sleep apnea 2012, , not on CPAP H/oDVT, right leg after venous ablation H/o Breast cancer  PLAN Anxiety, depression, insomnia: Intolerant to citalopram and now to Prozac. Doing well , cont ativan prn. Check a UDS GERD: Intolerant to Prevacid, on omeprazole and doing well. Left abdominal pain: Resolved, request to check a UA. Will do. RTC 4-5 months for a regular checkup

## 2015-06-01 NOTE — Progress Notes (Signed)
Pre visit review using our clinic review tool, if applicable. No additional management support is needed unless otherwise documented below in the visit note. 

## 2015-06-01 NOTE — Patient Instructions (Signed)
BEFORE YOU LEAVE THE OFFICE:  GO TO THE LAB , provide a urine sample  GO TO THE FRONT DESK  Schedule a routine office visit or check up to be done in  4-5 months  No  fasting    Front desk:    30

## 2015-06-02 LAB — URINE CULTURE: Colony Count: 3000

## 2015-06-08 ENCOUNTER — Other Ambulatory Visit: Payer: Self-pay | Admitting: Internal Medicine

## 2015-06-08 NOTE — Telephone Encounter (Signed)
Okay #90, one refill 

## 2015-06-08 NOTE — Telephone Encounter (Signed)
Rx faxed to [facility] pharmacy.  

## 2015-06-08 NOTE — Telephone Encounter (Signed)
Pt is requesting refill on Lorazepam.  Last OV: 06/01/2015 Last Fill: 04/12/2015 #90 and 1RF Pt takes 3 tablets daily PRN UDS: 07/05/2014 Low risk  Please advise.

## 2015-06-08 NOTE — Telephone Encounter (Signed)
Rx printed, awaiting MD signature.  

## 2015-06-09 ENCOUNTER — Telehealth: Payer: Self-pay

## 2015-06-09 NOTE — Telephone Encounter (Signed)
UDS: 06/01/2015  Positive for Lorazepam   Low risk per Dr. Larose Kells 06/09/2015

## 2015-07-01 ENCOUNTER — Encounter: Payer: Self-pay | Admitting: Physician Assistant

## 2015-07-01 ENCOUNTER — Ambulatory Visit (INDEPENDENT_AMBULATORY_CARE_PROVIDER_SITE_OTHER): Payer: Medicare Other | Admitting: Physician Assistant

## 2015-07-01 VITALS — BP 143/59 | HR 81 | Temp 98.7°F | Ht 66.0 in | Wt 165.6 lb

## 2015-07-01 DIAGNOSIS — J209 Acute bronchitis, unspecified: Secondary | ICD-10-CM | POA: Diagnosis not present

## 2015-07-01 MED ORDER — AZITHROMYCIN 250 MG PO TABS: ORAL_TABLET | ORAL | Status: DC

## 2015-07-01 MED ORDER — BENZONATATE 100 MG PO CAPS: 100.0000 mg | ORAL_CAPSULE | Freq: Two times a day (BID) | ORAL | Status: DC | PRN

## 2015-07-01 MED ORDER — HYDROCODONE-HOMATROPINE 5-1.5 MG/5ML PO SYRP: 5.0000 mL | ORAL_SOLUTION | Freq: Three times a day (TID) | ORAL | Status: DC | PRN

## 2015-07-01 NOTE — Assessment & Plan Note (Signed)
Rapid flu negative. Rx AZithromcyin.  Increase fluids.  Rest.  Saline nasal spray.  Probiotic.  Mucinex as directed.  Humidifier in bedroom. Cough syrup given.  Call or return to clinic if symptoms are not improving.

## 2015-07-01 NOTE — Progress Notes (Signed)
Patient presents to clinic today c/o fatigue and dry cough over the past 5 days with symptoms worsening in the past 3 days. Now with increased cough, sore throat, and chest congestion. Denies fever. Denies chest pain or SOB.   Past Medical History  Diagnosis Date  . Headache(784.0)     migraines  . Hyperlipidemia   . Osteoporosis   . CVA (cerebral infarction)   . Hemorrhoids   . Hyperplastic colon polyp 06/2001  . Diverticulitis   . Varicose vein   . DVT (deep venous thrombosis) ([place])     after venous ablation, R leg  . Fracture 09/10/07    L2, status post vertebroplasty of L2 performed by IR  . Cardiomyopathy     Probable Takotsubo, severe CP w/ normal cath in 1994. Severe CP in 2003 w/ widespread T wave inversions on ECG. Cath w/ minimal coronary disease but LV-gram showed periapical severe hypokinesis and basilar hyperkinesia (EF 40%). Last echo in 4/09 confirmed full LV functional recovery with EF 60%, no regional wall motion abnormalities, mild to moderate LVH.  . OSA (obstructive sleep apnea) 03-29-11    no cpap used, not a problem now.  Marland Kitchen CAD (coronary artery disease)     mild nonobstructive disease on cath in 2003  . Hypertension   . GERD (gastroesophageal reflux disease)   . Hiatal hernia 03-29-11    no nerve problems  . Depression with anxiety 03-29-11    lost husband 3'09  . OA (osteoarthritis) of knee 03-29-11    w/ bilateral knee pain-not a problem now  . Stroke [facility]) 03-29-11    CVA x2 -last 10'12-?TIA(visual problems)  . E. coli gastroenteritis 03-29-11    8'10  . Cancer ([place])   . Polycythemia   . Otalgia of both ears     Dr. Simeon Craft    Current Outpatient Prescriptions on File Prior to Visit  Medication Sig Dispense Refill  . acetaminophen-codeine (TYLENOL #3) 300-30 MG per tablet Take 1 tablet by mouth every 8 (eight) hours as needed. For migraine 120 tablet 0  . aspirin 81 MG tablet Take 81 mg by mouth daily.    . Cholecalciferol (VITAMIN D) 2000 UNITS  tablet Take 2,000 Units by mouth daily.    . hydrochlorothiazide (HYDRODIURIL) 25 MG tablet Take 1 tablet (25 mg total) by mouth daily as needed (swelling in the legs ). 90 tablet 2  . hydroxyurea (HYDREA) 500 MG capsule TAKE ONE CAPSULE BY MOUTH EVERY OTHER DAY, MAY TAKE WITH FOOD TO MINIMIZE SIDE EFFECTS 90 capsule 3  . letrozole (FEMARA) 2.5 MG tablet Take 1 tablet (2.5 mg total) by mouth daily. 90 tablet 4  . LORazepam (ATIVAN) 0.5 MG tablet Take 1 tablet by mouth in the morning and 2 tablets at bedtime as needed. 90 tablet 1  . metoprolol (LOPRESSOR) 50 MG tablet Take 25 mg by mouth 2 (two) times daily.    Marland Kitchen omeprazole (PRILOSEC) 20 MG capsule Take 20 mg by mouth daily.    . Probiotic Product (PROBIOTIC DAILY PO) Take by mouth every morning. Reported on 06/01/2015    . simethicone (MYLICON) 102 MG chewable tablet Chew 125 mg by mouth every 6 (six) hours as needed for flatulence.    . vitamin E 400 UNIT capsule Take 400 Units by mouth daily.      No current facility-administered medications on file prior to visit.    Allergies  Allergen Reactions  . Atorvastatin Diarrhea    REACTION: diarrhe  .  Ezetimibe     REACTION: nausea  . Fluvastatin Sodium Other (See Comments)    "Can't remember"  . Magnesium Hydroxide Other (See Comments)    REACTION: triggers HAs  . Meloxicam Other (See Comments)    Pt seeing auro's / spots.    . Omeprazole Magnesium     auras  . Pneumovax [Pneumococcal Polysaccharide Vaccine]     Local reaction  . Quinapril Hcl Other (See Comments)    03-29-11 "feelings of tiredness"  . Simvastatin Diarrhea  . Topamax [Topiramate] Itching  . Valsartan Itching and Other (See Comments)    REACTION: toes itch  . Vit D-Vit E-Safflower Oil Other (See Comments)    Headaches  . Carvedilol     "teeth hurt when I took it"  . Penicillins Other (See Comments) and Rash    Other Reaction: OTHER REACTION Mouth blisters 03-29-11    Family History  Problem Relation Age of  Onset  . Colon cancer Other     cousin  . Breast cancer Mother   . Breast cancer Sister   . Heart disease Father     MIs  . Leukemia Brother     GM    Social History   Social History  . Marital Status: Widowed    Spouse Name: N/A  . Number of Children: 3  . Years of Education: N/A   Occupational History  . n/a    Social History Main Topics  . Smoking status: Former Smoker -- 1.00 packs/day for 37 years    Types: Cigarettes    Start date: 06/26/1951    Quit date: 05/14/1988  . Smokeless tobacco: Never Used     Comment: quit 25 years ago  . Alcohol Use: 0.6 oz/week    1 Cans of beer per week     Comment: occasional/social  . Drug Use: No  . Sexual Activity: No   Other Topics Concern  . None   Social History Narrative   Lost husband 07/29/07-    Lives in [place]   Lives in a town house by herself,daughter moved out ~ 03-2015   Still drives                Review of Systems - See HPI.  All other ROS are negative.  BP 143/59 mmHg  Pulse 81  Temp(Src) 98.7 F (37.1 C) (Oral)  Ht 5' 6"  (1.676 m)  Wt 165 lb 9.6 oz (75.116 kg)  BMI 26.74 kg/m2  SpO2 97%  Physical Exam  Constitutional: She is oriented to person, place, and time and well-developed, well-nourished, and in no distress.  HENT:  Head: Normocephalic and atraumatic.  Right Ear: External ear normal.  Left Ear: External ear normal.  Nose: Nose normal.  Mouth/Throat: Oropharynx is clear and moist. No oropharyngeal exudate.  TM within normal limits bilaterally.  Eyes: Conjunctivae are normal.  Neck: Neck supple.  Cardiovascular: Normal rate, regular rhythm, normal heart sounds and intact distal pulses.   Pulmonary/Chest: Effort normal and breath sounds normal. No respiratory distress. She has no wheezes. She has no rales. She exhibits no tenderness.  Neurological: She is alert and oriented to person, place, and time.  Skin: Skin is warm and dry. No rash noted.  Psychiatric: Affect normal.  Vitals  reviewed.   Recent Results (from the past 2160 hour(s))  CBC with Differential [facility])     Status: Abnormal   Collection Time: 05/04/15  9:13 AM  Result Value Ref Range   WBC 5.3  3.9 - 10.0 10e3/uL   RBC 5.39 (H) 3.70 - 5.32 10e6/uL   HGB 12.6 11.6 - 15.9 g/dL   HCT 41.8 34.8 - 46.6 %   MCV 78 (L) 81 - 101 fL   MCH 23.4 (L) 26.0 - 34.0 pg   MCHC 30.1 (L) 32.0 - 36.0 g/dL   RDW 17.0 (H) 11.1 - 15.7 %   Platelets 370 145 - 400 10e3/uL   NEUT# 3.9 1.5 - 6.5 10e3/uL   LYMPH# 0.7 (L) 0.9 - 3.3 10e3/uL   MONO# 0.5 0.1 - 0.9 10e3/uL   Eosinophils Absolute 0.1 0.0 - 0.5 10e3/uL   BASO# 0.1 0.0 - 0.2 10e3/uL   NEUT% 73.9 39.6 - 80.0 %   LYMPH% 12.7 (L) 14.0 - 48.0 %   MONO% 9.8 0.0 - 13.0 %   EOS% 2.5 0.0 - 7.0 %   BASO% 1.1 0.0 - 2.0 %  Iron and TIBC     Status: Abnormal   Collection Time: 05/04/15  9:13 AM  Result Value Ref Range   Iron 24 (L) 41 - 142 ug/dL   TIBC 406 236 - 444 ug/dL   UIBC 382 120 - 384 ug/dL   %SAT 6 (L) 21 - 57 %  Ferritin     Status: None   Collection Time: 05/04/15  9:13 AM  Result Value Ref Range   Ferritin 30 9 - 269 ng/ml  Comprehensive metabolic panel     Status: Abnormal   Collection Time: 05/04/15  9:14 AM  Result Value Ref Range   Sodium 136 136 - 145 mEq/L   Potassium 4.6 3.5 - 5.1 mEq/L   Chloride 103 98 - 109 mEq/L   CO2 26 22 - 29 mEq/L   Glucose 97 70 - 140 mg/dl    Comment: Glucose reference range is for nonfasting patients. Fasting glucose reference range is 70- 100.   BUN 10.9 7.0 - 26.0 mg/dL   Creatinine 0.9 0.6 - 1.1 mg/dL   Total Bilirubin 1.29 (H) 0.20 - 1.20 mg/dL   Alkaline Phosphatase 82 40 - 150 U/L   AST 18 5 - 34 U/L   ALT 10 0 - 55 U/L   Total Protein 6.9 6.4 - 8.3 g/dL   Albumin 3.8 3.5 - 5.0 g/dL   Calcium 9.4 8.4 - 10.4 mg/dL   Anion Gap 7 3 - 11 mEq/L   EGFR 61 (L) >90 ml/min/1.73 m2    Comment: eGFR is calculated using the CKD-EPI Creatinine Equation (2009)  Lactate dehydrogenase     Status: None    Collection Time: 05/04/15  9:14 AM  Result Value Ref Range   LDH 158 125 - 245 U/L  Urinalysis, Routine w reflex microscopic     Status: Abnormal   Collection Time: 06/01/15 11:22 AM  Result Value Ref Range   Color, Urine YELLOW Yellow;Lt. Yellow   APPearance CLEAR Clear   Specific Gravity, Urine <=1.005 (A) 1.000 - 1.030   pH 6.0 5.0 - 8.0   Total Protein, Urine NEGATIVE Negative   Urine Glucose NEGATIVE Negative   Ketones, ur NEGATIVE Negative   Bilirubin Urine NEGATIVE Negative   Hgb urine dipstick NEGATIVE Negative   Urobilinogen, UA 0.2 0.0 - 1.0   Leukocytes, UA NEGATIVE Negative   Nitrite NEGATIVE Negative   WBC, UA none seen 0-2/hpf   RBC / HPF none seen 0-2/hpf  Urine Culture     Status: None   Collection Time: 06/01/15 11:22 AM  Result Value Ref Range  Colony Count 3,000 COLONIES/ML    Organism ID, Bacteria Insignificant Growth     Assessment/Plan: Acute bronchitis Rapid flu negative. Rx AZithromcyin.  Increase fluids.  Rest.  Saline nasal spray.  Probiotic.  Mucinex as directed.  Humidifier in bedroom. Cough syrup given.  Call or return to clinic if symptoms are not improving.

## 2015-07-01 NOTE — Progress Notes (Signed)
Pre visit review using our clinic review tool, if applicable. No additional management support is needed unless otherwise documented below in the visit note. 

## 2015-07-01 NOTE — Patient Instructions (Signed)
Take antibiotic (Azithromycin) as directed.  Increase fluids.  Get plenty of rest. Use Mucinex for congestion. Use cough syrup as directed. Take a daily probiotic (I recommend Align or Culturelle, but even Activia Yogurt may be beneficial).  A humidifier placed in the bedroom may offer some relief for a dry, scratchy throat of nasal irritation.  Read information below on acute bronchitis. Please call or return to clinic if symptoms are not improving.  Acute Bronchitis Bronchitis is when the airways that extend from the windpipe into the lungs get red, puffy, and painful (inflamed). Bronchitis often causes thick spit (mucus) to develop. This leads to a cough. A cough is the most common symptom of bronchitis. In acute bronchitis, the condition usually begins suddenly and goes away over time (usually in 2 weeks). Smoking, allergies, and asthma can make bronchitis worse. Repeated episodes of bronchitis may cause more lung problems.  HOME CARE  Rest.  Drink enough fluids to keep your pee (urine) clear or pale yellow (unless you need to limit fluids as told by your doctor).  Only take over-the-counter or prescription medicines as told by your doctor.  Avoid smoking and secondhand smoke. These can make bronchitis worse. If you are a smoker, think about using nicotine gum or skin patches. Quitting smoking will help your lungs heal faster.  Reduce the chance of getting bronchitis again by:  Washing your hands often.  Avoiding people with cold symptoms.  Trying not to touch your hands to your mouth, nose, or eyes.  Follow up with your doctor as told.  GET HELP IF: Your symptoms do not improve after 1 week of treatment. Symptoms include:  Cough.  Fever.  Coughing up thick spit.  Body aches.  Chest congestion.  Chills.  Shortness of breath.  Sore throat.  GET HELP RIGHT AWAY IF:   You have an increased fever.  You have chills.  You have severe shortness of breath.  You have  bloody thick spit (sputum).  You throw up (vomit) often.  You lose too much body fluid (dehydration).  You have a severe headache.  You faint.  MAKE SURE YOU:   Understand these instructions.  Will watch your condition.  Will get help right away if you are not doing well or get worse. Document Released: 10/17/2007 Document Revised: 12/31/2012 Document Reviewed: 10/21/2012 [facility] Patient Information 2015 [place], [place]. This information is not intended to replace advice given to you by your health care provider. Make sure you discuss any questions you have with your health care provider.

## 2015-07-18 ENCOUNTER — Telehealth: Payer: Self-pay | Admitting: Internal Medicine

## 2015-07-18 NOTE — Telephone Encounter (Signed)
VM from pt 3/6 2:54pm to schedule appt, LM for pt to call back to schedule and dial [phone] for a team member.

## 2015-07-25 ENCOUNTER — Encounter: Payer: Self-pay | Admitting: Internal Medicine

## 2015-07-25 ENCOUNTER — Ambulatory Visit (HOSPITAL_BASED_OUTPATIENT_CLINIC_OR_DEPARTMENT_OTHER)
Admission: RE | Admit: 2015-07-25 | Discharge: 2015-07-25 | Disposition: A | Discharge status: Home / Self Care | Payer: Medicare Other | Source: Ambulatory Visit | Attending: Internal Medicine | Admitting: Internal Medicine

## 2015-07-25 ENCOUNTER — Ambulatory Visit (INDEPENDENT_AMBULATORY_CARE_PROVIDER_SITE_OTHER): Payer: Medicare Other | Admitting: Internal Medicine

## 2015-07-25 VITALS — BP 118/76 | HR 64 | Temp 97.5°F | Ht 66.0 in | Wt 161.4 lb

## 2015-07-25 DIAGNOSIS — R5383 Other fatigue: Secondary | ICD-10-CM | POA: Diagnosis not present

## 2015-07-25 DIAGNOSIS — R634 Abnormal weight loss: Secondary | ICD-10-CM | POA: Insufficient documentation

## 2015-07-25 DIAGNOSIS — M810 Age-related osteoporosis without current pathological fracture: Secondary | ICD-10-CM | POA: Diagnosis not present

## 2015-07-25 DIAGNOSIS — K449 Diaphragmatic hernia without obstruction or gangrene: Secondary | ICD-10-CM | POA: Insufficient documentation

## 2015-07-25 DIAGNOSIS — R05 Cough: Secondary | ICD-10-CM | POA: Diagnosis not present

## 2015-07-25 DIAGNOSIS — Z09 Encounter for follow-up examination after completed treatment for conditions other than malignant neoplasm: Secondary | ICD-10-CM

## 2015-07-25 LAB — CBC WITH DIFFERENTIAL/PLATELET
Basophils Absolute: 0 10*3/uL (ref 0.0–0.1)
Basophils Relative: 0.3 % (ref 0.0–3.0)
Eosinophils Absolute: 0.1 10*3/uL (ref 0.0–0.7)
Eosinophils Relative: 2.7 % (ref 0.0–5.0)
HCT: 42.2 % (ref 36.0–46.0)
Hemoglobin: 13.2 g/dL (ref 12.0–15.0)
Lymphocytes Relative: 17.9 % (ref 12.0–46.0)
Lymphs Abs: 0.9 10*3/uL (ref 0.7–4.0)
MCHC: 31.4 g/dL (ref 30.0–36.0)
MCV: 78.1 fl (ref 78.0–100.0)
Monocytes Absolute: 0.5 10*3/uL (ref 0.1–1.0)
Monocytes Relative: 9.6 % (ref 3.0–12.0)
Neutro Abs: 3.6 10*3/uL (ref 1.4–7.7)
Neutrophils Relative %: 69.5 % (ref 43.0–77.0)
Platelets: 453 10*3/uL — ABNORMAL HIGH (ref 150.0–400.0)
RBC: 5.4 Mil/uL — ABNORMAL HIGH (ref 3.87–5.11)
RDW: 19.7 % — ABNORMAL HIGH (ref 11.5–15.5)
WBC: 5.2 10*3/uL (ref 4.0–10.5)

## 2015-07-25 LAB — TSH: TSH: 3.51 u[IU]/mL (ref 0.35–4.50)

## 2015-07-25 LAB — FOLATE: Folate: 10.1 ng/mL (ref >5.9)

## 2015-07-25 LAB — VITAMIN B12: Vitamin B-12: 279 pg/mL (ref 211–911)

## 2015-07-25 NOTE — Progress Notes (Signed)
Subjective:    Patient ID: [name], female    DOB: 30-Jun-1929, 82 y.o.   MRN: [id number]  DOS:  07/25/2015 Type of visit - description : Acute visit Interval history:  Chief complaint today is feeling very fatigued, states sx is going on for years but is gradually getting worse. She is able to do all her ADLs without problems but then when she finish her tasks feels exhausted. Somewhat sleepy.  Was seen with bronchitis lately, doing better. Noted wt loss, thinks because d/t  severe cough was not eating   Wt Readings from Last 3 Encounters:  07/25/15 161 lb 6 oz (73.199 kg)  07/01/15 165 lb 9.6 oz (75.116 kg)  06/01/15 167 lb 2 oz (75.807 kg)     Review of Systems   currently with no fever chills No chest pain, lower extremity edema, palpitations. No DOE with activities of daily living No nausea vomiting or blood in the stools. Cough d/t bronchitis improved No depression Headaches on and off, not a new issue.  Past Medical History  Diagnosis Date  . Headache(784.0)     migraines  . Hyperlipidemia   . Osteoporosis   . CVA (cerebral infarction)   . Hemorrhoids   . Hyperplastic colon polyp 06/2001  . Diverticulitis   . Varicose vein   . DVT (deep venous thrombosis) ([place])     after venous ablation, R leg  . Fracture 09/10/07    L2, status post vertebroplasty of L2 performed by IR  . Cardiomyopathy     Probable Takotsubo, severe CP w/ normal cath in 1994. Severe CP in 2003 w/ widespread T wave inversions on ECG. Cath w/ minimal coronary disease but LV-gram showed periapical severe hypokinesis and basilar hyperkinesia (EF 40%). Last echo in 4/09 confirmed full LV functional recovery with EF 60%, no regional wall motion abnormalities, mild to moderate LVH.  . OSA (obstructive sleep apnea) 03-29-11    no cpap used, not a problem now.  Marland Kitchen CAD (coronary artery disease)     mild nonobstructive disease on cath in 2003  . Hypertension   . GERD (gastroesophageal reflux  disease)   . Hiatal hernia 03-29-11    no nerve problems  . Depression with anxiety 03-29-11    lost husband 3'09  . OA (osteoarthritis) of knee 03-29-11    w/ bilateral knee pain-not a problem now  . Stroke [facility]) 03-29-11    CVA x2 -last 10'12-?TIA(visual problems)  . E. coli gastroenteritis 03-29-11    8'10  . Cancer ([place])   . Polycythemia   . Otalgia of both ears     Dr. Simeon Craft    Past Surgical History  Procedure Laterality Date  . Appendectomy    . Ovarian cyst surgery      left  . Tonsillectomy    . Back surgery    . Tubal ligation    . Dental extraction      L maxillary molar  . Kyphosis surgery  08/2007    cement used  . Total knee arthroplasty  06/2010    left  . Cholecystectomy  04/09/2011    Procedure: LAPAROSCOPIC CHOLECYSTECTOMY WITH INTRAOPERATIVE CHOLANGIOGRAM;  Surgeon: Odis Hollingshead, MD;  Location: [facility];  Service: General;  Laterality: N/A;  . Breast biopsy    . Breast cyst excision    . Breast lumpectomy      left stage I left breast cancer    Social History   Social History  . Marital  Status: Widowed    Spouse Name: N/A  . Number of Children: 3  . Years of Education: N/A   Occupational History  . n/a    Social History Main Topics  . Smoking status: Former Smoker -- 1.00 packs/day for 37 years    Types: Cigarettes    Start date: 06/26/1951    Quit date: 05/14/1988  . Smokeless tobacco: Never Used     Comment: quit 25 years ago  . Alcohol Use: 0.6 oz/week    1 Cans of beer per week     Comment: occasional/social  . Drug Use: No  . Sexual Activity: No   Other Topics Concern  . Not on file   Social History Narrative   Lost husband 07/29/07-    Lives in [place] in a town house by herself,daughter moved out ~ 03-2015, has 2 daughters in town   Still drives                     Medication List       This list is accurate as of: 07/25/15  1:07 PM.  Always use your most recent med list.                acetaminophen-codeine 300-30 MG tablet  Commonly known as:  TYLENOL #3  Take 1 tablet by mouth every 8 (eight) hours as needed. For migraine     aspirin 81 MG tablet  Take 81 mg by mouth daily.     hydrochlorothiazide 25 MG tablet  Commonly known as:  HYDRODIURIL  Take 1 tablet (25 mg total) by mouth daily as needed (swelling in the legs ).     hydroxyurea 500 MG capsule  Commonly known as:  HYDREA  TAKE ONE CAPSULE BY MOUTH EVERY OTHER DAY, MAY TAKE WITH FOOD TO MINIMIZE SIDE EFFECTS     letrozole 2.5 MG tablet  Commonly known as:  FEMARA  Take 1 tablet (2.5 mg total) by mouth daily.     LORazepam 0.5 MG tablet  Commonly known as:  ATIVAN  Take 1 tablet by mouth in the morning and 2 tablets at bedtime as needed.     metoprolol 50 MG tablet  Commonly known as:  LOPRESSOR  Take 25 mg by mouth 2 (two) times daily.     omeprazole 20 MG capsule  Commonly known as:  PRILOSEC  Take 20 mg by mouth daily.     PROBIOTIC DAILY PO  Take by mouth every morning. Reported on 06/01/2015     simethicone 125 MG chewable tablet  Commonly known as:  MYLICON  Chew 0000000 mg by mouth every 6 (six) hours as needed for flatulence. Reported on 07/25/2015     Vitamin D 2000 units tablet  Take 2,000 Units by mouth daily.     vitamin E 400 UNIT capsule  Take 400 Units by mouth daily.           Objective:   Physical Exam BP 118/76 mmHg  Pulse 64  Temp(Src) 97.5 F (36.4 C) (Oral)  Ht 5\' 6"  (1.676 m)  Wt 161 lb 6 oz (73.199 kg)  BMI 26.06 kg/m2  SpO2 97%  General:   Well developed, well nourished . NAD.  Neck: No  thyromegaly , normal carotid pulse. No JVD at 45 HEENT:  Normocephalic . Face symmetric, atraumatic Lungs:  CTA B Normal respiratory effort, no intercostal retractions, no accessory muscle use. Heart: RRR,  no murmur.  No pretibial  edema bilaterally  Abdomen:  Not distended, soft, non-tender. No rebound or rigidity.   Skin: Exposed areas without rash. Not pale. Not  jaundice Neurologic:  alert & oriented X3.  Speech normal, gait appropriate for age and unassisted Strength symmetric and appropriate for age.  Psych: Cognition and judgment appear intact.  Cooperative with normal attention span and concentration.  Behavior appropriate. No anxious or depressed appearing.    Assessment & Plan:   Assessment> HTN Dyslipidemia: Intolerant to most medicines including Zetia, Pravachol, Niaspan Depression, anxiety (03-2015 citalopram cause drowsiness, 04-2015 prozac diarrhea) POLYCYTEMIA-- sees hem-onc MSK: ---DJD :  pain medication as needed ---Osteoporosis:  T score -2.8 (02-2013):   declined treatment 04-2015 ---L2 fractures, s/p vertebral plasty 2009 Migraine headaches GI: GERD- intolerant to prevacid, visual disturbances HH, diverticulitis, Colon polyps  CV: ---Cardiomyopathy Probable Takotsubo cardiomyopathy: Severe chest pain with normal cath in 1994.  Severe chest pain in 2003 with widespread T wave inversions on ECG.  Cath with minimal coronary disease but LV-gram showed periapical severe hypokinesis and basilar hyperkinesia (EF 40%).  Last echo in 4/09 confirmed full LV functional recovery with EF 60%, mild LVH. ---Stroke / TIA 2012 Sleep apnea 2012, , not on CPAP H/oDVT, right leg after venous ablation H/o Breast cancer  PLAN Fatigue: chronic, gradually getting worse, not affecting her ADLs. States she still goes to the [place]  exercises twice a week. Sx likely multifactorial including the fact that she is CPAP intolerant. No depression,  CHF on clinical grounds, no persistent fever chills or headaches. Plan: CBC, TSH, vitamin D, 123456, folic acid. Will also get a chest x-ray because recent weight loss although wt loss likely related to decrease appetite d/t recent bronchitis. If not gradually improving or if she worse: will call  Otherwise RTC  3 months

## 2015-07-25 NOTE — Patient Instructions (Signed)
GO TO THE LAB : Get the blood work    GO TO THE FRONT DESK  *Schedule a routine office visit or check up to be done in  3 months  No  fasting     STOP BY THE FIRST FLOOR:  get the XR

## 2015-07-25 NOTE — Assessment & Plan Note (Signed)
Fatigue: chronic, gradually getting worse, not affecting her ADLs. States she still goes to the [place]  exercises twice a week. Sx likely multifactorial including the fact that she is CPAP intolerant. No depression,  CHF on clinical grounds, no persistent fever chills or headaches. Plan: CBC, TSH, vitamin D, 123456, folic acid. Will also get a chest x-ray because recent weight loss although wt loss likely related to decrease appetite d/t recent bronchitis. If not gradually improving or if she worse: will call  Otherwise RTC  3 months

## 2015-07-25 NOTE — Progress Notes (Signed)
Pre visit review using our clinic review tool, if applicable. No additional management support is needed unless otherwise documented below in the visit note. 

## 2015-07-29 LAB — VITAMIN D 1,25 DIHYDROXY
Vitamin D 1, 25 (OH)2 Total: 58 pg/mL (ref 18–72)
Vitamin D2 1, 25 (OH)2: <8 pg/mL
Vitamin D3 1, 25 (OH)2: 58 pg/mL

## 2015-08-19 ENCOUNTER — Encounter: Payer: Self-pay | Admitting: Internal Medicine

## 2015-08-24 ENCOUNTER — Other Ambulatory Visit: Payer: Self-pay | Admitting: Hematology & Oncology

## 2015-08-24 DIAGNOSIS — Z853 Personal history of malignant neoplasm of breast: Secondary | ICD-10-CM

## 2015-09-02 ENCOUNTER — Ambulatory Visit: Payer: Medicare Other

## 2015-09-02 ENCOUNTER — Ambulatory Visit (HOSPITAL_BASED_OUTPATIENT_CLINIC_OR_DEPARTMENT_OTHER): Payer: Medicare Other | Admitting: Hematology & Oncology

## 2015-09-02 ENCOUNTER — Other Ambulatory Visit (HOSPITAL_BASED_OUTPATIENT_CLINIC_OR_DEPARTMENT_OTHER): Payer: Medicare Other

## 2015-09-02 VITALS — BP 142/65 | HR 72 | Temp 98.3°F | Wt 162.0 lb

## 2015-09-02 DIAGNOSIS — C50912 Malignant neoplasm of unspecified site of left female breast: Secondary | ICD-10-CM

## 2015-09-02 DIAGNOSIS — Z79811 Long term (current) use of aromatase inhibitors: Secondary | ICD-10-CM | POA: Diagnosis not present

## 2015-09-02 DIAGNOSIS — Z170 Estrogen receptor positive status [ER+]: Secondary | ICD-10-CM | POA: Diagnosis not present

## 2015-09-02 DIAGNOSIS — C50011 Malignant neoplasm of nipple and areola, right female breast: Secondary | ICD-10-CM

## 2015-09-02 DIAGNOSIS — D751 Secondary polycythemia: Secondary | ICD-10-CM

## 2015-09-02 DIAGNOSIS — D45 Polycythemia vera: Secondary | ICD-10-CM | POA: Diagnosis not present

## 2015-09-02 DIAGNOSIS — C50012 Malignant neoplasm of nipple and areola, left female breast: Secondary | ICD-10-CM

## 2015-09-02 LAB — COMPREHENSIVE METABOLIC PANEL
ALT: 9 U/L (ref 0–55)
AST: 20 U/L (ref 5–34)
Albumin: 4.1 g/dL (ref 3.5–5.0)
Alkaline Phosphatase: 86 U/L (ref 40–150)
Anion Gap: 10 mEq/L (ref 3–11)
BUN: 11.1 mg/dL (ref 7.0–26.0)
CO2: 27 mEq/L (ref 22–29)
Calcium: 10.1 mg/dL (ref 8.4–10.4)
Chloride: 100 mEq/L (ref 98–109)
Creatinine: 1 mg/dL (ref 0.6–1.1)
EGFR: 54 mL/min/{1.73_m2} — ABNORMAL LOW (ref >90)
Glucose: 87 mg/dl (ref 70–140)
Potassium: 4.2 mEq/L (ref 3.5–5.1)
Sodium: 138 mEq/L (ref 136–145)
Total Bilirubin: 1.6 mg/dL — ABNORMAL HIGH (ref 0.20–1.20)
Total Protein: 7.4 g/dL (ref 6.4–8.3)

## 2015-09-02 LAB — CBC WITH DIFFERENTIAL (CANCER CENTER ONLY)
BASO#: 0.1 10*3/uL (ref 0.0–0.2)
BASO%: 1.2 % (ref 0.0–2.0)
EOS%: 3.5 % (ref 0.0–7.0)
Eosinophils Absolute: 0.2 10*3/uL (ref 0.0–0.5)
HCT: 43.4 % (ref 34.8–46.6)
HGB: 13.7 g/dL (ref 11.6–15.9)
LYMPH#: 1 10*3/uL (ref 0.9–3.3)
LYMPH%: 14.6 % (ref 14.0–48.0)
MCH: 24.8 pg — ABNORMAL LOW (ref 26.0–34.0)
MCHC: 31.6 g/dL — ABNORMAL LOW (ref 32.0–36.0)
MCV: 79 fL — ABNORMAL LOW (ref 81–101)
MONO#: 0.7 10*3/uL (ref 0.1–0.9)
MONO%: 10.2 % (ref 0.0–13.0)
NEUT#: 4.7 10*3/uL (ref 1.5–6.5)
NEUT%: 70.5 % (ref 39.6–80.0)
Platelets: 491 10*3/uL — ABNORMAL HIGH (ref 145–400)
RBC: 5.52 10*6/uL — ABNORMAL HIGH (ref 3.70–5.32)
RDW: 17.3 % — ABNORMAL HIGH (ref 11.1–15.7)
WBC: 6.6 10*3/uL (ref 3.9–10.0)

## 2015-09-02 LAB — FERRITIN: Ferritin: 32 ng/ml (ref 9–269)

## 2015-09-02 LAB — IRON AND TIBC
%SAT: 7 % — ABNORMAL LOW (ref 21–57)
Iron: 31 ug/dL — ABNORMAL LOW (ref 41–142)
TIBC: 451 ug/dL — ABNORMAL HIGH (ref 236–444)
UIBC: 420 ug/dL — ABNORMAL HIGH (ref 120–384)

## 2015-09-02 LAB — LACTATE DEHYDROGENASE: LDH: 174 U/L (ref 125–245)

## 2015-09-02 NOTE — Progress Notes (Signed)
Hematology and Oncology Follow Up Visit  JANAYLA CHICHESTER [id number] 04-24-1930 82 y.o. 09/02/2015   Principle Diagnosis:   Polycythemia vera-JAK2 positive  Stage I (T1bN0M0 ) infiltrating duct carcinoma the left breast   Current Therapy:    #1 phlebotomy to maintain hematocrit below 45%.  #2 Hydrea 500 mg by mouth every other day.  #3 Femara 2.5 mg by mouth daily - stopped 08/2015     Interim History:  Ms.  Wakeford is back for followup. Last saw her back in December. She no proms over the wintertime.  She's had no bleeding or bruising. She had fatigue. She is wondering if she can stop the Femara. She's been on it for 4 years. I think given that she had early stage breast cancer, that she will be okay to stop the Femara.  Her fatigue also might become from [facility]. She is taking the Hydrea every other day.  She's had no problem with her joints. She does have some arthritis but this is 90 were.  She's had no cough or shortness of breath. She's had no fever. She's had no infections. She's had no rashes.  Overall, her performance status is ECOG 1.  Medications:  Current outpatient prescriptions:  .  acetaminophen-codeine (TYLENOL #3) 300-30 MG per tablet, Take 1 tablet by mouth every 8 (eight) hours as needed. For migraine, Disp: 120 tablet, Rfl: 0 .  aspirin 81 MG tablet, Take 81 mg by mouth daily., Disp: , Rfl:  .  Cholecalciferol (VITAMIN D) 2000 UNITS tablet, Take 2,000 Units by mouth daily., Disp: , Rfl:  .  hydrochlorothiazide (HYDRODIURIL) 25 MG tablet, Take 1 tablet (25 mg total) by mouth daily as needed (swelling in the legs )., Disp: 90 tablet, Rfl: 2 .  hydroxyurea (HYDREA) 500 MG capsule, TAKE ONE CAPSULE BY MOUTH EVERY OTHER DAY, MAY TAKE WITH FOOD TO MINIMIZE SIDE EFFECTS, Disp: 90 capsule, Rfl: 3 .  letrozole (FEMARA) 2.5 MG tablet, Take 1 tablet (2.5 mg total) by mouth daily., Disp: 90 tablet, Rfl: 4 .  LORazepam (ATIVAN) 0.5 MG tablet, Take 1 tablet by mouth in  the morning and 2 tablets at bedtime as needed., Disp: 90 tablet, Rfl: 1 .  metoprolol (LOPRESSOR) 50 MG tablet, Take 25 mg by mouth 2 (two) times daily., Disp: , Rfl:  .  omeprazole (PRILOSEC) 20 MG capsule, Take 20 mg by mouth daily., Disp: , Rfl:  .  Probiotic Product (PROBIOTIC DAILY PO), Take by mouth every morning. Reported on 06/01/2015, Disp: , Rfl:  .  simethicone (MYLICON) 0000000 MG chewable tablet, Chew 125 mg by mouth every 6 (six) hours as needed for flatulence. Reported on 07/25/2015, Disp: , Rfl:  .  vitamin E 400 UNIT capsule, Take 400 Units by mouth daily. , Disp: , Rfl:   Allergies:  Allergies  Allergen Reactions  . Atorvastatin Diarrhea    REACTION: diarrhe  . Ezetimibe     REACTION: nausea  . Fluvastatin Sodium Other (See Comments)    "Can't remember"  . Magnesium Hydroxide Other (See Comments)    REACTION: triggers HAs  . Meloxicam Other (See Comments)    Pt seeing auro's / spots.    . Omeprazole Magnesium     auras  . Pneumovax [Pneumococcal Polysaccharide Vaccine]     Local reaction  . Quinapril Hcl Other (See Comments)    03-29-11 "feelings of tiredness"  . Simvastatin Diarrhea  . Topamax [Topiramate] Itching  . Valsartan Itching and Other (See Comments)  REACTION: toes itch  . Vit D-Vit E-Safflower Oil Other (See Comments)    Headaches  . Carvedilol     "teeth hurt when I took it"  . Penicillins Other (See Comments) and Rash    Other Reaction: OTHER REACTION Mouth blisters 03-29-11    Past Medical History, Surgical history, Social history, and Family History were reviewed and updated.  Review of Systems: As above  Physical Exam:  weight is 162 lb (73.483 kg). Her oral temperature is 98.3 F (36.8 C). Her blood pressure is 142/65 and her pulse is 72.   Well-developed and well-nourished white female. Head and neck exam shows no ocular or oral lesions. She has no palpable cervical or supraclavicular lymph nodes. Lungs are clear bilaterally. Cardiac  exam regular rate and rhythm with a normal S1 and S2. There are no murmurs rubs or bruits. Breast exam shows right breast with no masses or edema or erythema. There is no right axillary adenopathy. Left breast shows well-healed lumpectomy at the 2:00 position. She has no t obvious mass in the left breast. There is some slight contraction from radiation. She does not have any obvious left axillary adenopathy. Abdomen is soft. Has good bowel sounds. There is no palpable abdominal mass. There is no palpable liver or spleen tip. Neck exam shows no kyphosis. There is no tenderness over the spine ribs or hips. Extremities shows no lymphedema. She has no joint swelling. She's good range most of her joints. Skin exam no rashes. Neurological exam shows no focal neurological deficits.  Lab Results  Component Value Date   WBC 6.6 09/02/2015   HGB 13.7 09/02/2015   HCT 43.4 09/02/2015   MCV 79* 09/02/2015   PLT 491* 09/02/2015     Chemistry      Component Value Date/Time   NA 136 05/04/2015 0914   NA 136 02/02/2015 0939   NA 138 06/25/2013 1356   K 4.6 05/04/2015 0914   K 4.8 02/02/2015 0939   K 4.4 06/25/2013 1356   CL 103 02/02/2015 0939   CL 101 06/25/2013 1356   CO2 26 05/04/2015 0914   CO2 29 02/02/2015 0939   CO2 32 06/25/2013 1356   BUN 10.9 05/04/2015 0914   BUN 8 02/02/2015 0939   BUN 8 06/25/2013 1356   CREATININE 0.9 05/04/2015 0914   CREATININE 0.80 02/02/2015 0939   CREATININE 0.7 06/25/2013 1356      Component Value Date/Time   CALCIUM 9.4 05/04/2015 0914   CALCIUM 9.1 02/02/2015 0939   CALCIUM 9.3 06/25/2013 1356   ALKPHOS 82 05/04/2015 0914   ALKPHOS 77 02/02/2015 0939   ALKPHOS 85* 06/25/2013 1356   AST 18 05/04/2015 0914   AST 19 02/02/2015 0939   AST 22 06/25/2013 1356   ALT 10 05/04/2015 0914   ALT 10 02/02/2015 0939   ALT 13 06/25/2013 1356   BILITOT 1.29* 05/04/2015 0914   BILITOT 1.2 02/02/2015 0939   BILITOT 1.10 06/25/2013 1356         Impression and  Plan: Ms. Muska is a 82 year old white female with 2 separate problems. She is doing very well with respect to the polycythemia. We do not have to phlebotomize her. Again, I think this is reflective of the iron deficiency.  Her platelet count is up. This might be from iron deficiency. He might be the fact that she is on Hydrea every other day.  I would like to get her back in 2 months. We need to make sure  that her platelet count is not trying to trend upward. If so, then we will have to increase her Hydrea to daily dosing.     Volanda Napoleon, MD 4/21/201710:09 AM

## 2015-09-02 NOTE — Progress Notes (Signed)
No Phlebotomy today per Dr Ennever 

## 2015-09-04 ENCOUNTER — Other Ambulatory Visit: Payer: Self-pay | Admitting: Hematology & Oncology

## 2015-09-04 ENCOUNTER — Other Ambulatory Visit: Payer: Self-pay | Admitting: Internal Medicine

## 2015-09-05 NOTE — Telephone Encounter (Signed)
Last OV: 07/25/15 Last filled: 06/08/15, #90, 1 RF Sig: Take 1 tablet by mouth in the morning and 2 tablets at bedtime as needed UDS: 06/01/15, positive for ativan, low risk

## 2015-09-08 NOTE — Telephone Encounter (Signed)
Pt called in to follow up on her medication refill request for LORazepam   Pharmacy: [id number] - [place], [address] AT [address]

## 2015-09-09 MED ORDER — LORAZEPAM 0.5 MG PO TABS: ORAL_TABLET | ORAL | Status: DC

## 2015-09-09 NOTE — Telephone Encounter (Signed)
Ok 90 and 2  RF, RX printed

## 2015-09-27 ENCOUNTER — Encounter: Payer: Self-pay | Admitting: Internal Medicine

## 2015-09-27 ENCOUNTER — Ambulatory Visit (INDEPENDENT_AMBULATORY_CARE_PROVIDER_SITE_OTHER): Payer: Medicare Other | Admitting: Internal Medicine

## 2015-09-27 VITALS — BP 116/72 | HR 74 | Temp 98.0°F | Resp 16 | Ht 66.0 in | Wt 165.1 lb

## 2015-09-27 DIAGNOSIS — Z09 Encounter for follow-up examination after completed treatment for conditions other than malignant neoplasm: Secondary | ICD-10-CM | POA: Diagnosis not present

## 2015-09-27 DIAGNOSIS — R5383 Other fatigue: Secondary | ICD-10-CM

## 2015-09-27 DIAGNOSIS — G43109 Migraine with aura, not intractable, without status migrainosus: Secondary | ICD-10-CM

## 2015-09-27 DIAGNOSIS — F341 Dysthymic disorder: Secondary | ICD-10-CM

## 2015-09-27 NOTE — Progress Notes (Signed)
Subjective:    Patient ID: [name], female    DOB: 1929-12-29, 82 y.o.   MRN: [id number]  DOS:  09/27/2015 Type of visit - description : Follow-up from previous visit Interval history: Since the last office visit, fatigue is not as noticeable, she thinks is b/c Femara was discontinued. Emotionally doing well. She reports that has auras (bilateral, transient, visual disturbances) , yesterday had a episode that lasted a few hours, slightly unusual to her, usually symptoms are more transient. She however denies other neurological symptoms. See below.    Review of Systems  denies headache per se. No diplopia, slurred speech, motor deficits. No nausea or vomiting No neck stiffness  Past Medical History  Diagnosis Date  . Headache(784.0)     migraines  . Hyperlipidemia   . Osteoporosis   . CVA (cerebral infarction)   . Hemorrhoids   . Hyperplastic colon polyp 06/2001  . Diverticulitis   . Varicose vein   . DVT (deep venous thrombosis) ([place])     after venous ablation, R leg  . Fracture 09/10/07    L2, status post vertebroplasty of L2 performed by IR  . Cardiomyopathy     Probable Takotsubo, severe CP w/ normal cath in 1994. Severe CP in 2003 w/ widespread T wave inversions on ECG. Cath w/ minimal coronary disease but LV-gram showed periapical severe hypokinesis and basilar hyperkinesia (EF 40%). Last echo in 4/09 confirmed full LV functional recovery with EF 60%, no regional wall motion abnormalities, mild to moderate LVH.  . OSA (obstructive sleep apnea) 03-29-11    no cpap used, not a problem now.  Marland Kitchen CAD (coronary artery disease)     mild nonobstructive disease on cath in 2003  . Hypertension   . GERD (gastroesophageal reflux disease)   . Hiatal hernia 03-29-11    no nerve problems  . Depression with anxiety 03-29-11    lost husband 3'09  . OA (osteoarthritis) of knee 03-29-11    w/ bilateral knee pain-not a problem now  . Stroke [facility]) 03-29-11    CVA x2 -last  10'12-?TIA(visual problems)  . E. coli gastroenteritis 03-29-11    8'10  . Cancer ([place])   . Polycythemia   . Otalgia of both ears     Dr. Simeon Craft    Past Surgical History  Procedure Laterality Date  . Appendectomy    . Ovarian cyst surgery      left  . Tonsillectomy    . Back surgery    . Tubal ligation    . Dental extraction      L maxillary molar  . Kyphosis surgery  08/2007    cement used  . Total knee arthroplasty  06/2010    left  . Cholecystectomy  04/09/2011    Procedure: LAPAROSCOPIC CHOLECYSTECTOMY WITH INTRAOPERATIVE CHOLANGIOGRAM;  Surgeon: Odis Hollingshead, MD;  Location: [facility]S;  Service: General;  Laterality: N/A;  . Breast biopsy    . Breast cyst excision    . Breast lumpectomy      left stage I left breast cancer    Social History   Social History  . Marital Status: Widowed    Spouse Name: N/A  . Number of Children: 3  . Years of Education: N/A   Occupational History  . n/a    Social History Main Topics  . Smoking status: Former Smoker -- 1.00 packs/day for 37 years    Types: Cigarettes    Start date: 06/26/1951    Quit  date: 05/14/1988  . Smokeless tobacco: Never Used     Comment: quit 25 years ago  . Alcohol Use: 0.6 oz/week    1 Cans of beer per week     Comment: occasional/social  . Drug Use: No  . Sexual Activity: No   Other Topics Concern  . Not on file   Social History Narrative   Lost husband 07/29/07-    Lives in [place] in a town house by herself,daughter moved out ~ 03-2015, has 2 daughters in town   Still drives                     Medication List       This list is accurate as of: 09/27/15 11:59 PM.  Always use your most recent med list.               acetaminophen-codeine 300-30 MG tablet  Commonly known as:  TYLENOL #3  Take 1 tablet by mouth every 8 (eight) hours as needed. For migraine     aspirin 81 MG tablet  Take 81 mg by mouth daily.     hydrochlorothiazide 25 MG tablet  Commonly known as:   HYDRODIURIL  Take 1 tablet (25 mg total) by mouth daily as needed (swelling in the legs ).     hydroxyurea 500 MG capsule  Commonly known as:  HYDREA  TAKE 1 CAPSULE BY MOUTH EVERY OTHER DAY TAKE WITH FOOD TO MINIMIZE SIDE EFFECT     LORazepam 0.5 MG tablet  Commonly known as:  ATIVAN  Take 1 tablet by mouth in the morning and 2 tablets at bedtime as needed.     metoprolol 50 MG tablet  Commonly known as:  LOPRESSOR  Take 25 mg by mouth 2 (two) times daily.     omeprazole 20 MG capsule  Commonly known as:  PRILOSEC  Take 20 mg by mouth daily.     PROBIOTIC DAILY PO  Take by mouth every morning. Reported on 06/01/2015     simethicone 125 MG chewable tablet  Commonly known as:  MYLICON  Chew 0000000 mg by mouth every 6 (six) hours as needed for flatulence. Reported on 07/25/2015     Vitamin D 2000 units tablet  Take 2,000 Units by mouth daily.     vitamin E 400 UNIT capsule  Take 400 Units by mouth daily.           Objective:   Physical Exam BP 116/72 mmHg  Pulse 74  Temp(Src) 98 F (36.7 C) (Oral)  Resp 16  Ht 5\' 6"  (1.676 m)  Wt 165 lb 2 oz (74.9 kg)  BMI 26.66 kg/m2  SpO2 96% Well developed, well nourished . NAD.  Neck: No  thyromegaly , normal carotid pulse. No JVD at 45 HEENT:  Normocephalic . Face symmetric, atraumatic Lungs:  CTA B Normal respiratory effort, no intercostal retractions, no accessory muscle use. Heart: RRR,  no murmur.  No pretibial edema bilaterally  Skin: Exposed areas without rash. Not pale. Not jaundice Neurologic:  alert & oriented X3.  Speech normal, gait appropriate for age and unassisted Strength symmetric and appropriate for age.  External movements intact, pupils equal and reactive. Psych: Cognition and judgment appear intact.     Assessment & Plan:   Assessment> HTN Dyslipidemia: Intolerant to most medicines including Zetia, Pravachol, Niaspan Depression, anxiety (03-2015 citalopram cause drowsiness, 04-2015 prozac  diarrhea) POLYCYTEMIA-- sees hem-onc MSK: ---DJD :  pain medication as needed ---  Osteoporosis:  T score -2.8 (02-2013):   declined treatment 04-2015 ---L2 fractures, s/p vertebral plasty 2009 Migraine headaches GI: GERD- intolerant to prevacid, visual disturbances HH, diverticulitis, Colon polyps  CV: ---Cardiomyopathy Probable Takotsubo cardiomyopathy: Severe chest pain with normal cath in 1994.  Severe chest pain in 2003 with widespread T wave inversions on ECG.  Cath with minimal coronary disease but LV-gram showed periapical severe hypokinesis and basilar hyperkinesia (EF 40%).  Last echo in 4/09 confirmed full LV functional recovery with EF 60%, mild LVH. ---Stroke / TIA 2012 Sleep apnea 2012, , not on CPAP H/oDVT, right leg after venous ablation H/o Breast cancer  PLAN Fatigue: Since the last time she was here, all labs and chest x-ray came back normal. She feels better, patient thinks is related to discontinuation of Femara. Rx : Observation Anxiety depression and insomnia: Not an issue at this point, well-controlled with meds H/o migraine w/ Aura: In the last few years the most prominent sx is aura not HAs, had a slightly prolonged aura episode yesterday, neurological exam today is at baseline. Recommend observation, call if she has any unusual symptoms again. RTC 6 months

## 2015-09-27 NOTE — Progress Notes (Signed)
Pre visit review using our clinic review tool, if applicable. No additional management support is needed unless otherwise documented below in the visit note/SLS  

## 2015-09-28 NOTE — Assessment & Plan Note (Signed)
Fatigue: Since the last time she was here, all labs and chest x-ray came back normal. She feels better, patient thinks is related to discontinuation of Femara. Rx : Observation Anxiety depression and insomnia: Not an issue at this point, well-controlled with meds H/o migraine w/ Aura: In the last few years the most prominent sx is aura not HAs, had a slightly prolonged aura episode yesterday, neurological exam today is at baseline. Recommend observation, call if she has any unusual symptoms again. RTC 6 months

## 2015-10-04 ENCOUNTER — Telehealth: Payer: Self-pay | Admitting: Internal Medicine

## 2015-10-04 NOTE — Telephone Encounter (Signed)
Called to follow up with patient.  Left a message for call back.   

## 2015-10-04 NOTE — Telephone Encounter (Signed)
PLEASE NOTE: All timestamps contained within this report are represented as [place] Standard Time. CONFIDENTIALTY NOTICE: This fax transmission is intended only for the addressee. It contains information that is legally privileged, confidential or otherwise protected from use or disclosure. If you are not the intended recipient, you are strictly prohibited from reviewing, disclosing, copying using or disseminating any of this information or taking any action in reliance on or regarding this information. If you have received this fax in error, please notify us immediately by telephone so that we can arrange for its return to [place]. Phone: [phone], Toll-Free: [phone], Fax: [phone] Page: 1 of 1 Call Id: [id number] [facility] Patient Name: [name] DOB: 1929/11/06 Initial Comment Caller says she has been having a stomach ache for several days. She has tried to take pain medicine for it, but it's not helping. Her chest is hurting and she is having bloating all over her body. Nurse Assessment Nurse: Dimas Chyle, RN, Dellis Filbert Date/Time Eilene Ghazi Time): 10/04/2015 1:27:35 PM Confirm and document reason for call. If symptomatic, describe symptoms. You must click the next button to save text entered. ---Caller says she has been having a stomach ache for several days. She has tried to take pain medicine for it, but it's not helping. Her chest is hurting and she is having bloating all over her body. Last BM was today. Has the patient traveled out of the country within the last 30 days? ---No Does the patient have any new or worsening symptoms? ---Yes Will a triage be completed? ---Yes Related visit to physician within the last 2 weeks? ---No Does the PT have any chronic conditions? (i.e. diabetes, asthma, etc.) ---Yes List chronic conditions. ---HTN Is this a behavioral health or substance abuse  call? ---No Guidelines Guideline Title Affirmed Question Affirmed Notes Abdominal Pain - Female Age > 60 years Abdominal Pain - Female Age > 60 years Final Disposition User See Physician within 24 Hours Dimas Chyle, RN, [employer] Referrals REFERRED TO PCP OFFICE REFERRED TO PCP OFFICE Disagree/Comply: Comply Disagree/Comply: Comply

## 2015-10-13 DIAGNOSIS — H04123 Dry eye syndrome of bilateral lacrimal glands: Secondary | ICD-10-CM | POA: Diagnosis not present

## 2015-10-13 DIAGNOSIS — H35363 Drusen (degenerative) of macula, bilateral: Secondary | ICD-10-CM | POA: Diagnosis not present

## 2015-10-13 DIAGNOSIS — H25813 Combined forms of age-related cataract, bilateral: Secondary | ICD-10-CM | POA: Diagnosis not present

## 2015-10-13 DIAGNOSIS — H524 Presbyopia: Secondary | ICD-10-CM | POA: Diagnosis not present

## 2015-10-19 ENCOUNTER — Ambulatory Visit (INDEPENDENT_AMBULATORY_CARE_PROVIDER_SITE_OTHER): Payer: Medicare Other | Admitting: Physician Assistant

## 2015-10-19 ENCOUNTER — Encounter (HOSPITAL_BASED_OUTPATIENT_CLINIC_OR_DEPARTMENT_OTHER): Payer: Self-pay

## 2015-10-19 ENCOUNTER — Ambulatory Visit (HOSPITAL_BASED_OUTPATIENT_CLINIC_OR_DEPARTMENT_OTHER)
Admission: RE | Admit: 2015-10-19 | Discharge: 2015-10-19 | Disposition: A | Discharge status: Home / Self Care | Payer: Medicare Other | Source: Ambulatory Visit | Attending: Physician Assistant | Admitting: Physician Assistant

## 2015-10-19 ENCOUNTER — Other Ambulatory Visit: Payer: Self-pay | Admitting: Physician Assistant

## 2015-10-19 ENCOUNTER — Encounter: Payer: Self-pay | Admitting: Physician Assistant

## 2015-10-19 VITALS — BP 132/71 | HR 67 | Temp 98.1°F | Resp 16 | Ht 66.0 in | Wt 163.1 lb

## 2015-10-19 DIAGNOSIS — K760 Fatty (change of) liver, not elsewhere classified: Secondary | ICD-10-CM | POA: Diagnosis not present

## 2015-10-19 DIAGNOSIS — Z8719 Personal history of other diseases of the digestive system: Secondary | ICD-10-CM

## 2015-10-19 DIAGNOSIS — C50012 Malignant neoplasm of nipple and areola, left female breast: Secondary | ICD-10-CM | POA: Diagnosis not present

## 2015-10-19 DIAGNOSIS — M4856XA Collapsed vertebra, not elsewhere classified, lumbar region, initial encounter for fracture: Secondary | ICD-10-CM | POA: Diagnosis not present

## 2015-10-19 DIAGNOSIS — S32810D Multiple fractures of pelvis with stable disruption of pelvic ring, subsequent encounter for fracture with routine healing: Secondary | ICD-10-CM | POA: Diagnosis not present

## 2015-10-19 DIAGNOSIS — K449 Diaphragmatic hernia without obstruction or gangrene: Secondary | ICD-10-CM | POA: Diagnosis not present

## 2015-10-19 DIAGNOSIS — N9489 Other specified conditions associated with female genital organs and menstrual cycle: Secondary | ICD-10-CM

## 2015-10-19 DIAGNOSIS — C50011 Malignant neoplasm of nipple and areola, right female breast: Secondary | ICD-10-CM

## 2015-10-19 DIAGNOSIS — R1032 Left lower quadrant pain: Secondary | ICD-10-CM

## 2015-10-19 LAB — CBC WITH DIFFERENTIAL/PLATELET
Basophils Absolute: 0 10*3/uL (ref 0.0–0.1)
Basophils Relative: 0.3 % (ref 0.0–3.0)
Eosinophils Absolute: 0.1 10*3/uL (ref 0.0–0.7)
Eosinophils Relative: 2.1 % (ref 0.0–5.0)
HCT: 45.1 % (ref 36.0–46.0)
Hemoglobin: 14 g/dL (ref 12.0–15.0)
Lymphocytes Relative: 14.9 % (ref 12.0–46.0)
Lymphs Abs: 1 10*3/uL (ref 0.7–4.0)
MCHC: 31.1 g/dL (ref 30.0–36.0)
MCV: 77.3 fl — ABNORMAL LOW (ref 78.0–100.0)
Monocytes Absolute: 0.6 10*3/uL (ref 0.1–1.0)
Monocytes Relative: 9.3 % (ref 3.0–12.0)
Neutro Abs: 4.8 10*3/uL (ref 1.4–7.7)
Neutrophils Relative %: 73.4 % (ref 43.0–77.0)
Platelets: 506 10*3/uL — ABNORMAL HIGH (ref 150.0–400.0)
RBC: 5.83 Mil/uL — ABNORMAL HIGH (ref 3.87–5.11)
RDW: 16.6 % — ABNORMAL HIGH (ref 11.5–15.5)
WBC: 6.5 10*3/uL (ref 4.0–10.5)

## 2015-10-19 LAB — COMPREHENSIVE METABOLIC PANEL
ALT: 11 U/L (ref 0–35)
AST: 18 U/L (ref 0–37)
Albumin: 4.3 g/dL (ref 3.5–5.2)
Alkaline Phosphatase: 87 U/L (ref 39–117)
BUN: 11 mg/dL (ref 6–23)
CO2: 30 mEq/L (ref 19–32)
Calcium: 10.2 mg/dL (ref 8.4–10.5)
Chloride: 99 mEq/L (ref 96–112)
Creatinine, Ser: 0.89 mg/dL (ref 0.40–1.20)
GFR: 64.19 mL/min (ref >60.00)
Glucose, Bld: 121 mg/dL — ABNORMAL HIGH (ref 70–99)
Potassium: 5.6 mEq/L — ABNORMAL HIGH (ref 3.5–5.1)
Sodium: 137 mEq/L (ref 135–145)
Total Bilirubin: 1.9 mg/dL — ABNORMAL HIGH (ref 0.2–1.2)
Total Protein: 7.4 g/dL (ref 6.0–8.3)

## 2015-10-19 MED ORDER — IOPAMIDOL (ISOVUE-300) INJECTION 61%
100.0000 mL | Freq: Once | INTRAVENOUS | Status: AC | PRN
  Administered 2015-10-19: 100 mL via INTRAVENOUS

## 2015-10-19 NOTE — Progress Notes (Signed)
Pre visit review using our clinic review tool, if applicable. No additional management support is needed unless otherwise documented below in the visit note/SLS  

## 2015-10-19 NOTE — Patient Instructions (Signed)
Please go to the lab for blood work. Then go downstairs for CT scan, you can come back up to office once done.  Stay well hydrated. Eat a bland diet. Pain medication as directed if needed. We will be altering regimen as soon as I have results in a bit.

## 2015-10-19 NOTE — Progress Notes (Signed)
Patient with medical history significant for bilateral neoplasms of areola (Followed by Dr. Marin Olp), osteoporosis (previously refused treatment from PCP) with history of R hip fractures, and past medical history significant for diverticulitis presents to clinic today c/o 3 weeks of LLQ pain with tenderness and bloating. Patient describes pain as constant, aching and cramping pain. Is worse with sitting for a long time. Is relieved with laying down. Endorses abdominal bloating. Denies change to bowel movements. Denies hard stools. LBM last night was loose. Denies blood in stool. Denies urinary symptoms. Denies fever, chills. Denies nausea or vomiting. Patient with history of multiple fractures in pelvis and spinal column secondary to osteoporosis. CT Abdomen/Pelvis from 2015 revealed multiple fractures of pelvis.   Past Medical History  Diagnosis Date  . Headache(784.0)     migraines  . Hyperlipidemia   . Osteoporosis   . CVA (cerebral infarction)   . Hemorrhoids   . Hyperplastic colon polyp 06/2001  . Diverticulitis   . Varicose vein   . DVT (deep venous thrombosis) ([place])     after venous ablation, R leg  . Fracture 09/10/07    L2, status post vertebroplasty of L2 performed by IR  . Cardiomyopathy     Probable Takotsubo, severe CP w/ normal cath in 1994. Severe CP in 2003 w/ widespread T wave inversions on ECG. Cath w/ minimal coronary disease but LV-gram showed periapical severe hypokinesis and basilar hyperkinesia (EF 40%). Last echo in 4/09 confirmed full LV functional recovery with EF 60%, no regional wall motion abnormalities, mild to moderate LVH.  . OSA (obstructive sleep apnea) 03-29-11    no cpap used, not a problem now.  Marland Kitchen CAD (coronary artery disease)     mild nonobstructive disease on cath in 2003  . Hypertension   . GERD (gastroesophageal reflux disease)   . Hiatal hernia 03-29-11    no nerve problems  . Depression with anxiety 03-29-11    lost husband 3'09  . OA  (osteoarthritis) of knee 03-29-11    w/ bilateral knee pain-not a problem now  . Stroke [facility]) 03-29-11    CVA x2 -last 10'12-?TIA(visual problems)  . E. coli gastroenteritis 03-29-11    8'10  . Cancer ([place])   . Polycythemia   . Otalgia of both ears     Dr. Simeon Craft    Current Outpatient Prescriptions on File Prior to Visit  Medication Sig Dispense Refill  . acetaminophen-codeine (TYLENOL #3) 300-30 MG per tablet Take 1 tablet by mouth every 8 (eight) hours as needed. For migraine 120 tablet 0  . aspirin 81 MG tablet Take 81 mg by mouth daily.    . Cholecalciferol (VITAMIN D) 2000 UNITS tablet Take 2,000 Units by mouth daily.    . hydrochlorothiazide (HYDRODIURIL) 25 MG tablet Take 1 tablet (25 mg total) by mouth daily as needed (swelling in the legs ). 90 tablet 2  . hydroxyurea (HYDREA) 500 MG capsule TAKE 1 CAPSULE BY MOUTH EVERY OTHER DAY TAKE WITH FOOD TO MINIMIZE SIDE EFFECT 90 capsule 0  . LORazepam (ATIVAN) 0.5 MG tablet Take 1 tablet by mouth in the morning and 2 tablets at bedtime as needed. 90 tablet 2  . metoprolol (LOPRESSOR) 50 MG tablet Take 25 mg by mouth 2 (two) times daily.    Marland Kitchen omeprazole (PRILOSEC) 20 MG capsule Take 20 mg by mouth daily.    . simethicone (MYLICON) 016 MG chewable tablet Chew 125 mg by mouth every 6 (six) hours as needed for flatulence. Reported  on 07/25/2015    . vitamin E 400 UNIT capsule Take 400 Units by mouth daily.      No current facility-administered medications on file prior to visit.    Allergies  Allergen Reactions  . Atorvastatin Diarrhea    REACTION: diarrhe  . Ezetimibe     REACTION: nausea  . Fluvastatin Sodium Other (See Comments)    "Can't remember"  . Magnesium Hydroxide Other (See Comments)    REACTION: triggers HAs  . Meloxicam Other (See Comments)    Pt seeing auro's / spots.    . Omeprazole Magnesium     auras  . Pneumovax [Pneumococcal Polysaccharide Vaccine]     Local reaction  . Quinapril Hcl Other (See Comments)     03-29-11 "feelings of tiredness"  . Simvastatin Diarrhea  . Topamax [Topiramate] Itching  . Valsartan Itching and Other (See Comments)    REACTION: toes itch  . Vit D-Vit E-Safflower Oil Other (See Comments)    Headaches  . Carvedilol     "teeth hurt when I took it"  . Penicillins Other (See Comments) and Rash    Other Reaction: OTHER REACTION Mouth blisters 03-29-11    Family History  Problem Relation Age of Onset  . Colon cancer Other     cousin  . Breast cancer Mother   . Breast cancer Sister   . Heart disease Father     MIs  . Leukemia Brother     GM    Social History   Social History  . Marital Status: Widowed    Spouse Name: N/A  . Number of Children: 3  . Years of Education: N/A   Occupational History  . n/a    Social History Main Topics  . Smoking status: Former Smoker -- 1.00 packs/day for 37 years    Types: Cigarettes    Start date: 06/26/1951    Quit date: 05/14/1988  . Smokeless tobacco: Never Used     Comment: quit 25 years ago  . Alcohol Use: 0.6 oz/week    1 Cans of beer per week     Comment: occasional/social  . Drug Use: No  . Sexual Activity: No   Other Topics Concern  . None   Social History Narrative   Lost husband 07/29/07-    Lives in [place]   Lives in a town house by herself,daughter moved out ~ 03-2015, has 2 daughters in town   Still drives                Review of Systems - See HPI.  All other ROS are negative.  BP 132/71 mmHg  Pulse 67  Temp(Src) 98.1 F (36.7 C) (Oral)  Resp 16  Ht 5' 6"  (1.676 m)  Wt 163 lb 2 oz (73.993 kg)  BMI 26.34 kg/m2  SpO2 98%  Physical Exam  Constitutional: She is well-developed, well-nourished, and in no distress.  HENT:  Head: Normocephalic and atraumatic.  Eyes: Conjunctivae are normal.  Cardiovascular: Normal rate, regular rhythm, normal heart sounds and intact distal pulses.   Pulmonary/Chest: Effort normal and breath sounds normal. No respiratory distress. She has no wheezes.  She has no rales. She exhibits no tenderness.  Abdominal: Bowel sounds are normal. She exhibits no distension and no mass. There is no rebound and no guarding.  Mild LLQ tenderness with deep palpation. No rebound tenderness, guarding or mass.   Musculoskeletal:       Right hip: She exhibits normal range of motion, normal strength, no  tenderness and no bony tenderness.       Left hip: She exhibits normal range of motion, normal strength, no tenderness and no bony tenderness.  Vitals reviewed.   Recent Results (from the past 2160 hour(s))  CBC w/Diff     Status: Abnormal   Collection Time: 07/25/15  9:54 AM  Result Value Ref Range   WBC 5.2 4.0 - 10.5 K/uL   RBC 5.40 (H) 3.87 - 5.11 Mil/uL   Hemoglobin 13.2 12.0 - 15.0 g/dL   HCT 42.2 36.0 - 46.0 %   MCV 78.1 78.0 - 100.0 fl   MCHC 31.4 30.0 - 36.0 g/dL   RDW 19.7 (H) 11.5 - 15.5 %   Platelets 453.0 (H) 150.0 - 400.0 K/uL   Neutrophils Relative % 69.5 43.0 - 77.0 %   Lymphocytes Relative 17.9 12.0 - 46.0 %   Monocytes Relative 9.6 3.0 - 12.0 %   Eosinophils Relative 2.7 0.0 - 5.0 %   Basophils Relative 0.3 0.0 - 3.0 %   Neutro Abs 3.6 1.4 - 7.7 K/uL   Lymphs Abs 0.9 0.7 - 4.0 K/uL   Monocytes Absolute 0.5 0.1 - 1.0 K/uL   Eosinophils Absolute 0.1 0.0 - 0.7 K/uL   Basophils Absolute 0.0 0.0 - 0.1 K/uL  TSH     Status: None   Collection Time: 07/25/15  9:54 AM  Result Value Ref Range   TSH 3.51 0.35 - 4.50 uIU/mL  Vitamin D 1,25 dihydroxy     Status: None   Collection Time: 07/25/15  9:54 AM  Result Value Ref Range   Vitamin D 1, 25 (OH)2 Total 58 18 - 72 pg/mL   Vitamin D3 1, 25 (OH)2 58 pg/mL   Vitamin D2 1, 25 (OH)2 <8 pg/mL    Comment: Vitamin D3, 1,25(OH)2 indicates both endogenous production and supplementation.  Vitamin D2, 1,25(OH)2 is an indicator of exogeous sources, such as diet or supplementation.  Interpretation and therapy are based on measurement of Vitamin D,1,25(OH)2, Total. This test was developed and its  analytical performance characteristics have been determined by [facility], [place], [place]. It has not been cleared or approved by the FDA. This assay has been validated pursuant to the CLIA regulations and is used for clinical purposes.   B12     Status: None   Collection Time: 07/25/15  9:54 AM  Result Value Ref Range   Vitamin B-12 279 211 - 911 pg/mL  Folate     Status: None   Collection Time: 07/25/15  9:54 AM  Result Value Ref Range   Folate 10.1 >5.9 ng/mL  CBC w/Diff     Status: Abnormal   Collection Time: 09/02/15  9:25 AM  Result Value Ref Range   WBC 6.6 3.9 - 10.0 10e3/uL   RBC 5.52 (H) 3.70 - 5.32 10e6/uL   HGB 13.7 11.6 - 15.9 g/dL   HCT 43.4 34.8 - 46.6 %   MCV 79 (L) 81 - 101 fL   MCH 24.8 (L) 26.0 - 34.0 pg   MCHC 31.6 (L) 32.0 - 36.0 g/dL   RDW 17.3 (H) 11.1 - 15.7 %   Platelets 491 (H) 145 - 400 10e3/uL   NEUT# 4.7 1.5 - 6.5 10e3/uL   LYMPH# 1.0 0.9 - 3.3 10e3/uL   MONO# 0.7 0.1 - 0.9 10e3/uL   Eosinophils Absolute 0.2 0.0 - 0.5 10e3/uL   BASO# 0.1 0.0 - 0.2 10e3/uL   NEUT% 70.5 39.6 - 80.0 %   LYMPH% 14.6  14.0 - 48.0 %   MONO% 10.2 0.0 - 13.0 %   EOS% 3.5 0.0 - 7.0 %   BASO% 1.2 0.0 - 2.0 %  LDH     Status: None   Collection Time: 09/02/15  9:25 AM  Result Value Ref Range   LDH 174 125 - 245 U/L  CMP     Status: Abnormal   Collection Time: 09/02/15  9:25 AM  Result Value Ref Range   Sodium 138 136 - 145 mEq/L   Potassium 4.2 3.5 - 5.1 mEq/L   Chloride 100 98 - 109 mEq/L   CO2 27 22 - 29 mEq/L   Glucose 87 70 - 140 mg/dl    Comment: Glucose reference range is for nonfasting patients. Fasting glucose reference range is 70- 100.   BUN 11.1 7.0 - 26.0 mg/dL   Creatinine 1.0 0.6 - 1.1 mg/dL   Total Bilirubin 1.60 (H) 0.20 - 1.20 mg/dL   Alkaline Phosphatase 86 40 - 150 U/L   AST 20 5 - 34 U/L   ALT 9 0 - 55 U/L   Total Protein 7.4 6.4 - 8.3 g/dL   Albumin 4.1 3.5 - 5.0 g/dL   Calcium 10.1 8.4 - 10.4 mg/dL   Anion Gap 10  3 - 11 mEq/L   EGFR 54 (L) >90 ml/min/1.73 m2    Comment: eGFR is calculated using the CKD-EPI Creatinine Equation (2009)  Ferritin     Status: None   Collection Time: 09/02/15  9:25 AM  Result Value Ref Range   Ferritin 32 9 - 269 ng/ml  Iron and TIBC     Status: Abnormal   Collection Time: 09/02/15  9:25 AM  Result Value Ref Range   Iron 31 (L) 41 - 142 ug/dL   TIBC 451 (H) 236 - 444 ug/dL   UIBC 420 (H) 120 - 384 ug/dL   %SAT 7 (L) 21 - 57 %  CBC w/Diff     Status: Abnormal   Collection Time: 10/19/15  9:19 AM  Result Value Ref Range   WBC 6.5 4.0 - 10.5 K/uL   RBC 5.83 (H) 3.87 - 5.11 Mil/uL   Hemoglobin 14.0 12.0 - 15.0 g/dL   HCT 45.1 36.0 - 46.0 %   MCV 77.3 (L) 78.0 - 100.0 fl   MCHC 31.1 30.0 - 36.0 g/dL   RDW 16.6 (H) 11.5 - 15.5 %   Platelets 506.0 (H) 150.0 - 400.0 K/uL   Neutrophils Relative % 73.4 43.0 - 77.0 %   Lymphocytes Relative 14.9 12.0 - 46.0 %   Monocytes Relative 9.3 3.0 - 12.0 %   Eosinophils Relative 2.1 0.0 - 5.0 %   Basophils Relative 0.3 0.0 - 3.0 %   Neutro Abs 4.8 1.4 - 7.7 K/uL   Lymphs Abs 1.0 0.7 - 4.0 K/uL   Monocytes Absolute 0.6 0.1 - 1.0 K/uL   Eosinophils Absolute 0.1 0.0 - 0.7 K/uL   Basophils Absolute 0.0 0.0 - 0.1 K/uL  Comp Met (CMET)     Status: Abnormal   Collection Time: 10/19/15 11:08 AM  Result Value Ref Range   Sodium 137 135 - 145 mEq/L   Potassium 5.6 (H) 3.5 - 5.1 mEq/L   Chloride 99 96 - 112 mEq/L   CO2 30 19 - 32 mEq/L   Glucose, Bld 121 (H) 70 - 99 mg/dL   BUN 11 6 - 23 mg/dL   Creatinine, Ser 0.89 0.40 - 1.20 mg/dL   Total Bilirubin 1.9 (H)  0.2 - 1.2 mg/dL   Alkaline Phosphatase 87 39 - 117 U/L   AST 18 0 - 37 U/L   ALT 11 0 - 35 U/L   Total Protein 7.4 6.0 - 8.3 g/dL   Albumin 4.3 3.5 - 5.2 g/dL   Calcium 10.2 8.4 - 10.5 mg/dL   GFR 64.19 >60.00 mL/min    Assessment/Plan: 1. LLQ abdominal pain In patient with active cancer, hx of osteoporosis with history of pathologic fractures and history of  diverticulitis. Only mild tenderness noted with deep palpation. Unclear. Will obtain STAT CT and labs to further assess. Labs were stable overall. CT revealed pelvic congestion with venous insufficiency. No acute findings. No new fractures noted. Discussed pain management with patient. Bowel regimen reviewed. Urgent referral placed to Gynecology for further assessment of pain and CT findings.   - CT Abdomen Pelvis W Contrast; Future - CBC w/Diff - Comp Met (CMET) - Ambulatory referral to Gynecology  2. Hx of diverticulitis of colon - CT Abdomen Pelvis W Contrast; Future  3. Bilateral malignant neoplasm involving both nipple and areola in female [facility]) - CT Abdomen Pelvis W Contrast; Future  4. Multiple closed fractures of pelvis with stable disruption of pelvic circle with routine healing, subsequent encounter Patient with this history in conjunction with untreated osteoporosis. Recommended she follow-up with her PCP to discuss management as she has previously declined. Discussed OTC Vitamin D and calcium supplementation. - CT Abdomen Pelvis W Contrast; Future

## 2015-10-20 ENCOUNTER — Telehealth: Payer: Self-pay | Admitting: Internal Medicine

## 2015-10-21 NOTE — Telephone Encounter (Signed)
error:[id number] ° °

## 2015-11-11 ENCOUNTER — Encounter: Payer: Self-pay | Admitting: Hematology & Oncology

## 2015-11-11 ENCOUNTER — Other Ambulatory Visit (HOSPITAL_BASED_OUTPATIENT_CLINIC_OR_DEPARTMENT_OTHER): Payer: Medicare Other

## 2015-11-11 ENCOUNTER — Ambulatory Visit (HOSPITAL_BASED_OUTPATIENT_CLINIC_OR_DEPARTMENT_OTHER): Payer: Medicare Other

## 2015-11-11 ENCOUNTER — Ambulatory Visit (HOSPITAL_BASED_OUTPATIENT_CLINIC_OR_DEPARTMENT_OTHER): Payer: Medicare Other | Admitting: Hematology & Oncology

## 2015-11-11 VITALS — BP 149/72 | HR 60 | Temp 98.2°F | Resp 16 | Ht 66.0 in | Wt 164.1 lb

## 2015-11-11 DIAGNOSIS — D751 Secondary polycythemia: Secondary | ICD-10-CM

## 2015-11-11 DIAGNOSIS — D45 Polycythemia vera: Secondary | ICD-10-CM | POA: Diagnosis not present

## 2015-11-11 DIAGNOSIS — R21 Rash and other nonspecific skin eruption: Secondary | ICD-10-CM

## 2015-11-11 DIAGNOSIS — Z853 Personal history of malignant neoplasm of breast: Secondary | ICD-10-CM | POA: Diagnosis not present

## 2015-11-11 DIAGNOSIS — T7840XA Allergy, unspecified, initial encounter: Secondary | ICD-10-CM

## 2015-11-11 DIAGNOSIS — C50012 Malignant neoplasm of nipple and areola, left female breast: Secondary | ICD-10-CM

## 2015-11-11 DIAGNOSIS — C50011 Malignant neoplasm of nipple and areola, right female breast: Secondary | ICD-10-CM

## 2015-11-11 LAB — IRON AND TIBC
%SAT: 9 % — ABNORMAL LOW (ref 21–57)
Iron: 36 ug/dL — ABNORMAL LOW (ref 41–142)
TIBC: 418 ug/dL (ref 236–444)
UIBC: 381 ug/dL (ref 120–384)

## 2015-11-11 LAB — FERRITIN: Ferritin: 37 ng/ml (ref 9–269)

## 2015-11-11 MED ORDER — METHYLPREDNISOLONE 4 MG PO TBPK: ORAL_TABLET | ORAL | Status: DC

## 2015-11-11 NOTE — Progress Notes (Signed)
[name] presents today for phlebotomy per MD orders. Phlebotomy procedure started at 1045 and ended at 1110. 500 grams removed using 20g IV needle Patient observed for 30 minutes after procedure without any incident. Patient tolerated procedure well. IV needle removed intact.

## 2015-11-11 NOTE — Patient Instructions (Signed)

## 2015-11-11 NOTE — Addendum Note (Signed)
Addended by: Burney Gauze R on: 11/11/2015 10:35 AM   Modules accepted: Orders

## 2015-11-11 NOTE — Progress Notes (Signed)
Hematology and Oncology Follow Up Visit  [name]:[id number] June 02, 1929 82 y.o. 11/11/2015   Principle Diagnosis:   Polycythemia vera-JAK2 positive  Stage I (T1bN0M0 ) infiltrating duct carcinoma the left breast   Current Therapy:    #1 phlebotomy to maintain hematocrit below 45%.  #2 Hydrea 500 mg by mouth every other day.  #3 Femara 2.5 mg by mouth daily - stopped 08/2015     Interim History:  [name] is back for followup. Last saw her back in April.    Her main problem is itching. She is a rash. She has felt a whole lot better off the Femara.  She thinks the rash is from the [facility]. She only takes this every other day. I have a hard time believing that the rash is from [facility]. Typically, Hydrea causes ulcerations in the lower legs area and I suppose that this rash might be from Hydrea. As such, will stop the Hydrea for right now. I will think it is reasonable tostop the Hydrea. We can always try her on anagrelide if necessary.  She has been having some abdominal pain. She went to the emergency room in May and had a CT scan done. This showed a large hiatal hernia and a prolapsed bladder. I suppose that the prolapsed bladder might be the problem with the pain. No enlarged lymph nodes were noted in the left inguinal region.  This is doing a little bit better.  She has had no fever. She has had no visual issues. She has had arthritic issues which are chronic.  Currently, her performance status is ECOG 2.   Medications:  Current outpatient prescriptions:  .  acetaminophen-codeine (TYLENOL #3) 300-30 MG per tablet, Take 1 tablet by mouth every 8 (eight) hours as needed. For migraine, Disp: 120 tablet, Rfl: 0 .  aspirin 81 MG tablet, Take 81 mg by mouth daily., Disp: , Rfl:  .  Cholecalciferol (VITAMIN D) 2000 UNITS tablet, Take 2,000 Units by mouth daily., Disp: , Rfl:  .  hydrochlorothiazide (HYDRODIURIL) 25 MG tablet, Take 1 tablet (25 mg total) by mouth daily as  needed (swelling in the legs )., Disp: 90 tablet, Rfl: 2 .  LORazepam (ATIVAN) 0.5 MG tablet, Take 1 tablet by mouth in the morning and 2 tablets at bedtime as needed., Disp: 90 tablet, Rfl: 2 .  metoprolol (LOPRESSOR) 50 MG tablet, Take 25 mg by mouth 2 (two) times daily., Disp: , Rfl:  .  omeprazole (PRILOSEC) 20 MG capsule, Take 20 mg by mouth daily., Disp: , Rfl:  .  simethicone (MYLICON) 0000000 MG chewable tablet, Chew 125 mg by mouth every 6 (six) hours as needed for flatulence. Reported on 07/25/2015, Disp: , Rfl:  .  vitamin E 400 UNIT capsule, Take 800 Units by mouth daily. , Disp: , Rfl:  .  methylPREDNISolone (MEDROL DOSEPAK) 4 MG TBPK tablet, Take as directed:  6 pills x 1 day, 5 pills x 1 day, 4 pills x 1 day, etc.., Disp: 21 tablet, Rfl: 0  Allergies:  Allergies  Allergen Reactions  . Atorvastatin Diarrhea    REACTION: diarrhe  . Ezetimibe     REACTION: nausea  . Fluvastatin Sodium Other (See Comments)    "Can't remember"  . Magnesium Hydroxide Other (See Comments)    REACTION: triggers HAs  . Meloxicam Other (See Comments)    Pt seeing auro's / spots.    . Omeprazole Magnesium     auras  . Pneumovax [Pneumococcal Polysaccharide  Vaccine]     Local reaction  . Quinapril Hcl Other (See Comments)    03-29-11 "feelings of tiredness"  . Simvastatin Diarrhea  . Topamax [Topiramate] Itching  . Valsartan Itching and Other (See Comments)    REACTION: toes itch  . Vit D-Vit E-Safflower Oil Other (See Comments)    Headaches  . Carvedilol     "teeth hurt when I took it"  . Penicillins Other (See Comments) and Rash    Other Reaction: OTHER REACTION Mouth blisters 03-29-11    Past Medical History, Surgical history, Social history, and Family History were reviewed and updated.  Review of Systems: As above  Physical Exam:  height is 5\' 6"  (1.676 m) and weight is 164 lb 1.9 oz (74.444 kg). Her oral temperature is 98.2 F (36.8 C). Her blood pressure is 149/72 and her pulse is  60. Her respiration is 16.   Well-developed and well-nourished white female. Head and neck exam shows no ocular or oral lesions. She has no palpable cervical or supraclavicular lymph nodes. Lungs are clear bilaterally. Cardiac exam regular rate and rhythm with a normal S1 and S2. There are no murmurs rubs or bruits. Breast exam shows right breast with no masses or edema or erythema. There is no right axillary adenopathy. Left breast shows well-healed lumpectomy at the 2:00 position. She has no t obvious mass in the left breast. There is some slight contraction from radiation. She does not have any obvious left axillary adenopathy. Abdomen is soft. Has good bowel sounds. There is no palpable abdominal mass. There is no palpable liver or spleen tip. Neck exam shows no kyphosis. There is no tenderness over the spine ribs or hips. Extremities shows no lymphedema. She has no joint swelling. She's good range most of her joints. Skin exam no rashes. Neurological exam shows no focal neurological deficits.  Lab Results  Component Value Date   WBC 6.5 10/19/2015   HGB 14.0 10/19/2015   HCT 45.1 10/19/2015   MCV 77.3* 10/19/2015   PLT 506.0* 10/19/2015     Chemistry      Component Value Date/Time   NA 137 10/19/2015 1108   NA 138 09/02/2015 0925   NA 138 06/25/2013 1356   K 5.6* 10/19/2015 1108   K 4.2 09/02/2015 0925   K 4.4 06/25/2013 1356   CL 99 10/19/2015 1108   CL 101 06/25/2013 1356   CO2 30 10/19/2015 1108   CO2 27 09/02/2015 0925   CO2 32 06/25/2013 1356   BUN 11 10/19/2015 1108   BUN 11.1 09/02/2015 0925   BUN 8 06/25/2013 1356   CREATININE 0.89 10/19/2015 1108   CREATININE 1.0 09/02/2015 0925   CREATININE 0.7 06/25/2013 1356      Component Value Date/Time   CALCIUM 10.2 10/19/2015 1108   CALCIUM 10.1 09/02/2015 0925   CALCIUM 9.3 06/25/2013 1356   ALKPHOS 87 10/19/2015 1108   ALKPHOS 86 09/02/2015 0925   ALKPHOS 85* 06/25/2013 1356   AST 18 10/19/2015 1108   AST 20 09/02/2015  0925   AST 22 06/25/2013 1356   ALT 11 10/19/2015 1108   ALT 9 09/02/2015 0925   ALT 13 06/25/2013 1356   BILITOT 1.9* 10/19/2015 1108   BILITOT 1.60* 09/02/2015 0925   BILITOT 1.10 06/25/2013 1356         Impression and Plan: [name] is a 82 year old white female with 2 separate problems. She has been doing very well with respect to the polycythemia. However, today, we  need to phlebotomize her.   And I shows why she has this rash. She is convinced it is from the [facility]. She will take Hydrea every other day. However, we will stop the Hydrea.  I told her take over-the-counter Pepcid at 20 mg by mouth twice a day.  I will give her a Medrol Dosepak prescription for 6 days.  I would like to see her back in another month or so. Hopefully, she will be doing much better by then.  As far as the breast cancers concerned, this is not a real problem.  I'm not sure is why she is having this abdominal pain. Her CT scan or showed a hiatal hernia. Otherwise, nothing else was noted. There was no splenomegaly.  I'll plan to get her back in one month.  I spent about 30 minutes with her today.    Volanda Napoleon, MD 6/30/201710:23 AM

## 2015-11-21 ENCOUNTER — Ambulatory Visit
Admission: RE | Admit: 2015-11-21 | Discharge: 2015-11-21 | Disposition: A | Discharge status: Home / Self Care | Payer: Medicare Other | Source: Ambulatory Visit | Attending: Hematology & Oncology | Admitting: Hematology & Oncology

## 2015-11-21 DIAGNOSIS — R922 Inconclusive mammogram: Secondary | ICD-10-CM | POA: Diagnosis not present

## 2015-11-21 DIAGNOSIS — Z853 Personal history of malignant neoplasm of breast: Secondary | ICD-10-CM

## 2015-12-12 ENCOUNTER — Encounter: Payer: Self-pay | Admitting: Hematology & Oncology

## 2015-12-12 ENCOUNTER — Other Ambulatory Visit (HOSPITAL_BASED_OUTPATIENT_CLINIC_OR_DEPARTMENT_OTHER): Payer: Medicare Other

## 2015-12-12 ENCOUNTER — Ambulatory Visit (HOSPITAL_BASED_OUTPATIENT_CLINIC_OR_DEPARTMENT_OTHER): Payer: Medicare Other | Admitting: Hematology & Oncology

## 2015-12-12 DIAGNOSIS — D45 Polycythemia vera: Secondary | ICD-10-CM

## 2015-12-12 DIAGNOSIS — D751 Secondary polycythemia: Secondary | ICD-10-CM

## 2015-12-12 DIAGNOSIS — R601 Generalized edema: Secondary | ICD-10-CM

## 2015-12-12 DIAGNOSIS — C50912 Malignant neoplasm of unspecified site of left female breast: Secondary | ICD-10-CM | POA: Diagnosis not present

## 2015-12-12 DIAGNOSIS — T7840XA Allergy, unspecified, initial encounter: Secondary | ICD-10-CM

## 2015-12-12 LAB — CBC WITH DIFFERENTIAL (CANCER CENTER ONLY)
BASO#: 0 10*3/uL (ref 0.0–0.2)
BASO%: 0.5 % (ref 0.0–2.0)
EOS%: 2.1 % (ref 0.0–7.0)
Eosinophils Absolute: 0.1 10*3/uL (ref 0.0–0.5)
HCT: 41.3 % (ref 34.8–46.6)
HGB: 12.6 g/dL (ref 11.6–15.9)
LYMPH#: 1.1 10*3/uL (ref 0.9–3.3)
LYMPH%: 17 % (ref 14.0–48.0)
MCH: 24 pg — ABNORMAL LOW (ref 26.0–34.0)
MCHC: 30.5 g/dL — ABNORMAL LOW (ref 32.0–36.0)
MCV: 79 fL — ABNORMAL LOW (ref 81–101)
MONO#: 0.7 10*3/uL (ref 0.1–0.9)
MONO%: 10 % (ref 0.0–13.0)
NEUT#: 4.6 10*3/uL (ref 1.5–6.5)
NEUT%: 70.4 % (ref 39.6–80.0)
Platelets: 563 10*3/uL — ABNORMAL HIGH (ref 145–400)
RBC: 5.25 10*6/uL (ref 3.70–5.32)
RDW: 16.1 % — ABNORMAL HIGH (ref 11.1–15.7)
WBC: 6.5 10*3/uL (ref 3.9–10.0)

## 2015-12-12 MED ORDER — HYDROCHLOROTHIAZIDE 25 MG PO TABS: 25.0000 mg | ORAL_TABLET | Freq: Every day | ORAL | 2 refills | Status: DC | PRN

## 2015-12-12 NOTE — Progress Notes (Signed)
Hematology and Oncology Follow Up Visit  [name] [id number] 1929/10/16 82 y.o. 12/12/2015   Principle Diagnosis:   Polycythemia vera-JAK2 positive  Stage I (T1bN0M0 ) infiltrating duct carcinoma the left breast   Current Therapy:    #1 phlebotomy to maintain hematocrit below 45%.  #2 Hydrea 500 mg by mouth every other day.  #3 Femara 2.5 mg by mouth daily - stopped 08/2015     Interim History:  Ms.  Parrish is back for followup. She is doing pretty well. She really has no specific complaints. She did have a mammogram recently in early July. This was negative for any suspicious findings.  She now is off Hydrea. She had a rash. I was not sure if the rash was from [facility].   She has had no headache. There has been no pain in the hands or feet. She's had no cough or shortness of breath. She's had no change in bowel or bladder habits.   Currently, her performance status is ECOG 2.   Medications:  Current Outpatient Prescriptions:  .  acetaminophen-codeine (TYLENOL #3) 300-30 MG per tablet, Take 1 tablet by mouth every 8 (eight) hours as needed. For migraine, Disp: 120 tablet, Rfl: 0 .  aspirin 81 MG tablet, Take 81 mg by mouth daily., Disp: , Rfl:  .  Cholecalciferol (VITAMIN D) 2000 UNITS tablet, Take 2,000 Units by mouth daily., Disp: , Rfl:  .  hydrochlorothiazide (HYDRODIURIL) 25 MG tablet, Take 1 tablet (25 mg total) by mouth daily as needed (swelling in the legs )., Disp: 90 tablet, Rfl: 2 .  LORazepam (ATIVAN) 0.5 MG tablet, Take 1 tablet by mouth in the morning and 2 tablets at bedtime as needed., Disp: 90 tablet, Rfl: 2 .  metoprolol (LOPRESSOR) 50 MG tablet, Take 25 mg by mouth 2 (two) times daily., Disp: , Rfl:  .  omeprazole (PRILOSEC) 20 MG capsule, Take 20 mg by mouth daily., Disp: , Rfl:  .  simethicone (MYLICON) 0000000 MG chewable tablet, Chew 125 mg by mouth every 6 (six) hours as needed for flatulence. Reported on 07/25/2015, Disp: , Rfl:  .  vitamin E 400  UNIT capsule, Take 800 Units by mouth daily. , Disp: , Rfl:   Allergies:  Allergies  Allergen Reactions  . Penicillins Other (See Comments), Rash and Hives    Other Reaction: OTHER REACTION Other Reaction: OTHER REACTION Mouth blisters 03-29-11  . Atorvastatin Diarrhea    REACTION: diarrhe  . Ezetimibe     REACTION: nausea  . Fluvastatin Sodium Other (See Comments)    "Can't remember"  . Magnesium Hydroxide Other (See Comments)    REACTION: triggers HAs  . Meloxicam Other (See Comments)    Pt seeing auro's / spots.    . Omeprazole Magnesium     auras  . Pneumovax [Pneumococcal Polysaccharide Vaccine]     Local reaction  . Quinapril Hcl Other (See Comments)    03-29-11 "feelings of tiredness"  . Simvastatin Diarrhea  . Topamax [Topiramate] Itching  . Valsartan Itching and Other (See Comments)    REACTION: toes itch  . Vit D-Vit E-Safflower Oil Other (See Comments)    Headaches  . Carvedilol     "teeth hurt when I took it"    Past Medical History, Surgical history, Social history, and Family History were reviewed and updated.  Review of Systems: As above  Physical Exam:  height is 5\' 6"  (1.676 m) and weight is 167 lb 0.6 oz (75.8 kg). Her oral temperature  is 98 F (36.7 C). Her blood pressure is 135/53 (abnormal) and her pulse is 68. Her respiration is 16.   Well-developed and well-nourished white female. Head and neck exam shows no ocular or oral lesions. She has no palpable cervical or supraclavicular lymph nodes. Lungs are clear bilaterally. Cardiac exam regular rate and rhythm with a normal S1 and S2. There are no murmurs rubs or bruits. Breast exam shows right breast with no masses or edema or erythema. There is no right axillary adenopathy. Left breast shows well-healed lumpectomy at the 2:00 position. She has no t obvious mass in the left breast. There is some slight contraction from radiation. She does not have any obvious left axillary adenopathy. Abdomen is soft.  Has good bowel sounds. There is no palpable abdominal mass. There is no palpable liver or spleen tip. Neck exam shows no kyphosis. There is no tenderness over the spine ribs or hips. Extremities shows no lymphedema. She has no joint swelling. She's good range most of her joints. Skin exam no rashes. Neurological exam shows no focal neurological deficits.  Lab Results  Component Value Date   WBC 6.5 12/12/2015   HGB 12.6 12/12/2015   HCT 41.3 12/12/2015   MCV 79 (L) 12/12/2015   PLT 563 (H) 12/12/2015     Chemistry      Component Value Date/Time   NA 137 10/19/2015 1108   NA 138 09/02/2015 0925   K 5.6 (H) 10/19/2015 1108   K 4.2 09/02/2015 0925   CL 99 10/19/2015 1108   CL 101 06/25/2013 1356   CO2 30 10/19/2015 1108   CO2 27 09/02/2015 0925   BUN 11 10/19/2015 1108   BUN 11.1 09/02/2015 0925   CREATININE 0.89 10/19/2015 1108   CREATININE 1.0 09/02/2015 0925      Component Value Date/Time   CALCIUM 10.2 10/19/2015 1108   CALCIUM 10.1 09/02/2015 0925   ALKPHOS 87 10/19/2015 1108   ALKPHOS 86 09/02/2015 0925   AST 18 10/19/2015 1108   AST 20 09/02/2015 0925   ALT 11 10/19/2015 1108   ALT 9 09/02/2015 0925   BILITOT 1.9 (H) 10/19/2015 1108   BILITOT 1.60 (H) 09/02/2015 0925         Impression and Plan: [name] is a 82 year old white female with 2 separate problems. She has been doing very well with respect to the polycythemia.  I think that the platelet count going up is a little bit of a concern. We have to get her back on to the Hydrea. I'm not sure if this rash that she had previously is related to the Hydrea.  I told her to take Hydrea 500 mg every other day.   She does not need to be phlebotomized today. That is a positive for [place].   I will like to see her back in one month just so we can see how her platelet count is. She typically responds well to the Hydrea.    Volanda Napoleon, MD 7/31/20175:59 PM

## 2015-12-12 NOTE — Progress Notes (Deleted)
No phlebotomy needed per Dr Marin Olp; HCT 41.3

## 2015-12-13 IMAGING — CT CT ABD-PELV W/ CM
2 of 5 series · 16 of 46 positions shown, 18 images · IV contrast (APPLIED)
Comparison: 09/23/2012

CLINICAL DATA: Abdominal pain.  Breast cancer.  Polycythemia.

EXAM:
CT ABDOMEN AND PELVIS WITH CONTRAST
TECHNIQUE: Multidetector CT imaging of the abdomen and pelvis was performed
using the standard protocol following bolus administration of
intravenous contrast.
CONTRAST:  100mL OMNIPAQUE IOHEXOL 300 MG/ML  SOLN

[Series 2: abd/pelvis 5.0 b31f · axial · 0.68mm/px · z∈[-469,-89]mm · 13 of 86 slices shown, 15 images]
[im 5/86  soft-tissue]
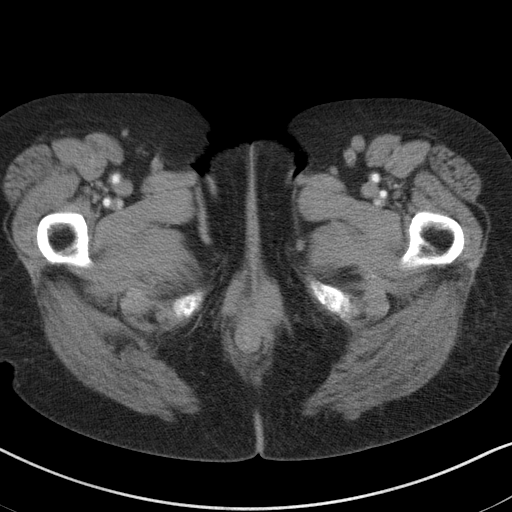
[im 5/86  bone]
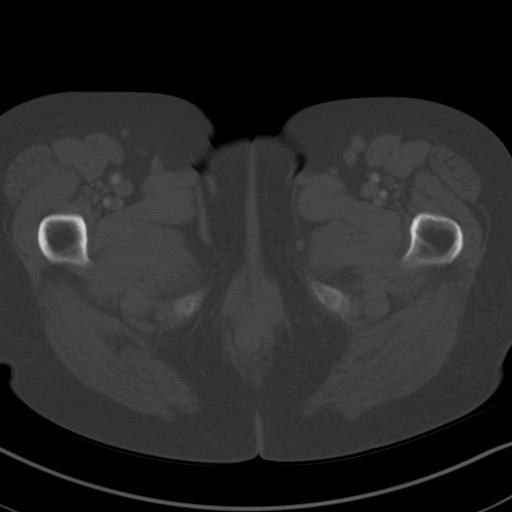
[im 10/86  soft-tissue]
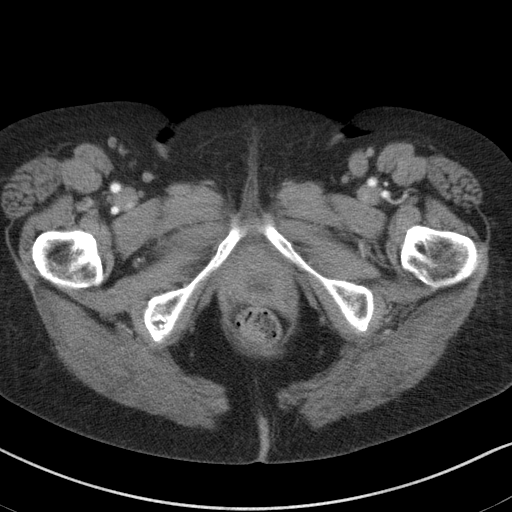
[im 19/86  soft-tissue]
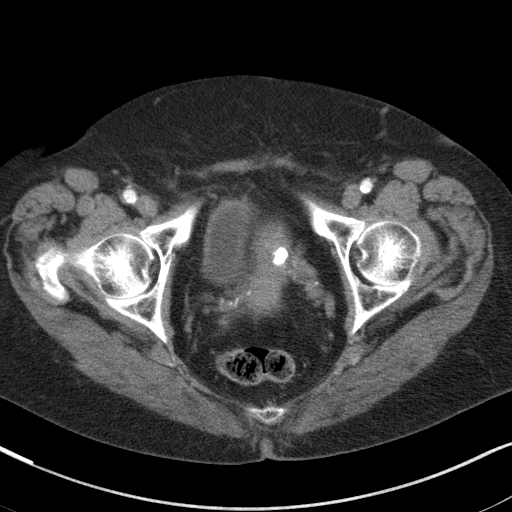
[im 24/86  soft-tissue]
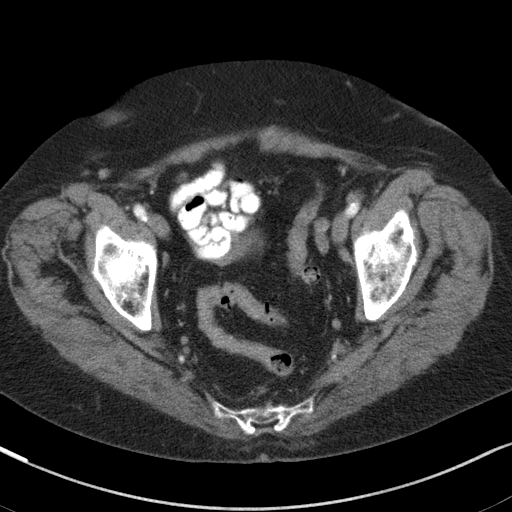
[im 29/86  soft-tissue]
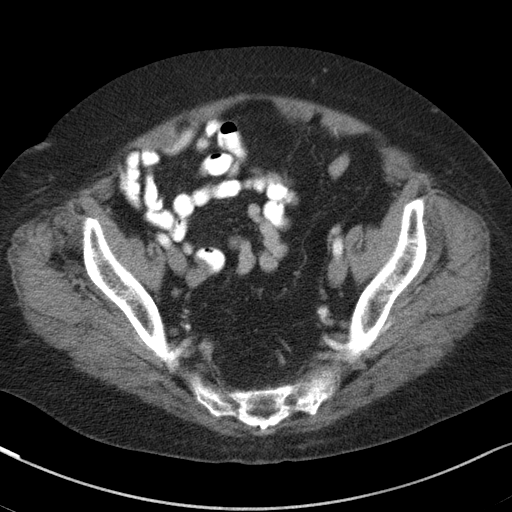
[im 38/86  soft-tissue]
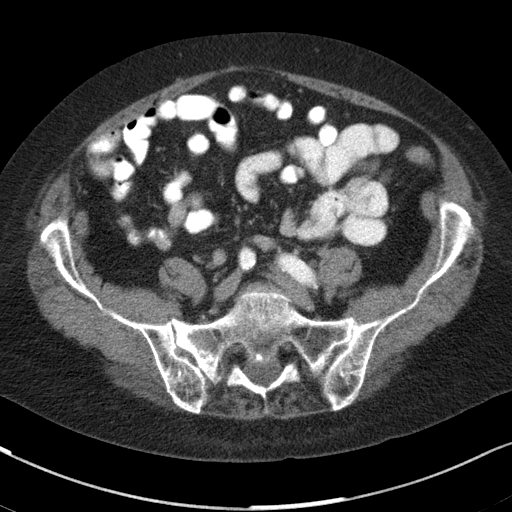
[im 43/86  soft-tissue]
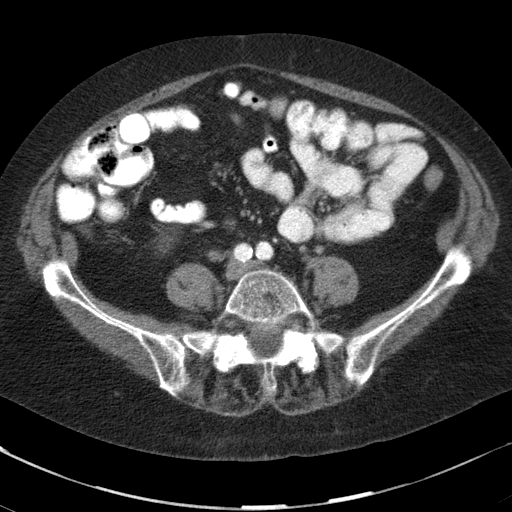
[im 48/86  soft-tissue]
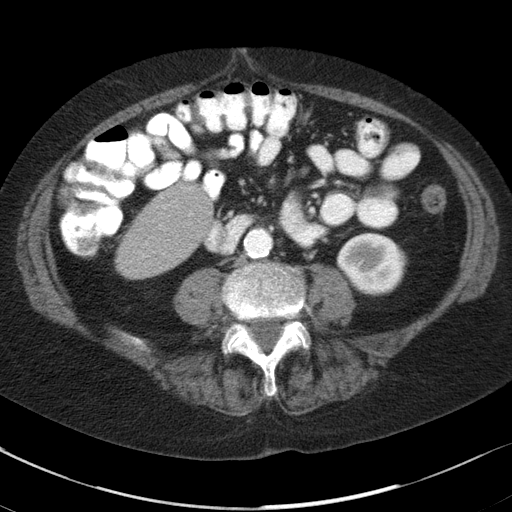
[im 57/86  soft-tissue]
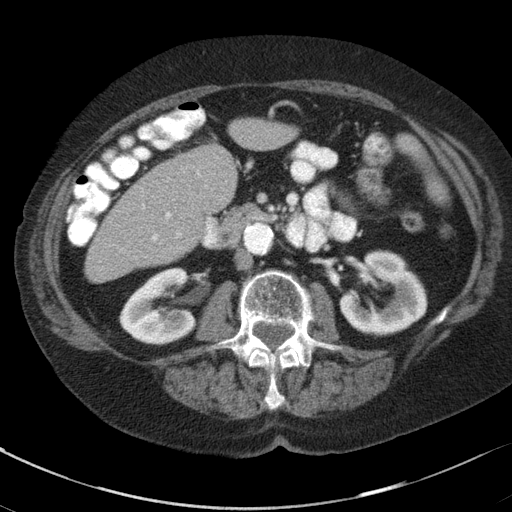
[im 57/86  bone]
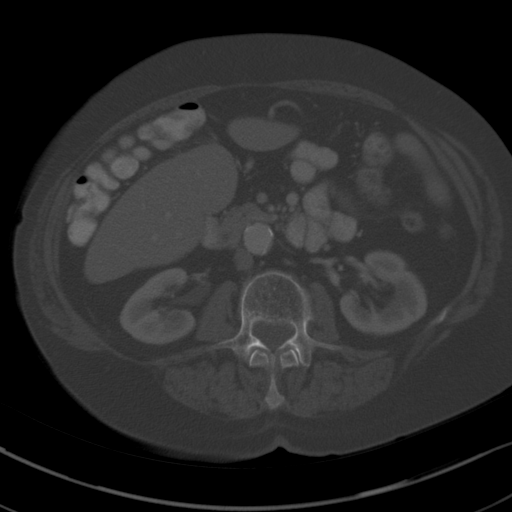
[im 62/86  soft-tissue]
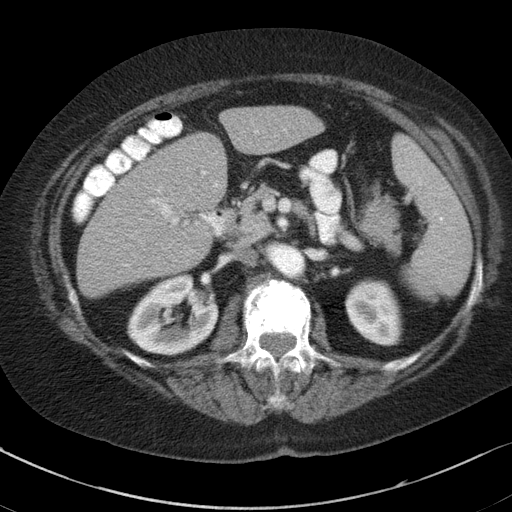
[im 67/86  soft-tissue]
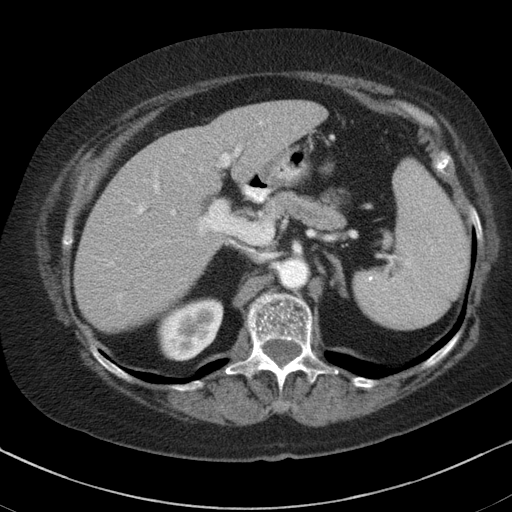
[im 76/86  soft-tissue]
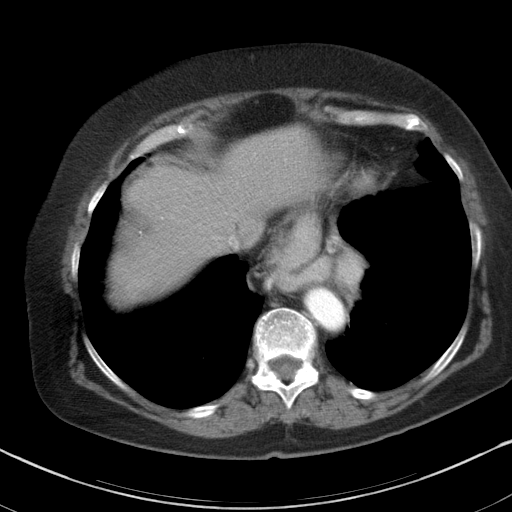
[im 81/86  soft-tissue]
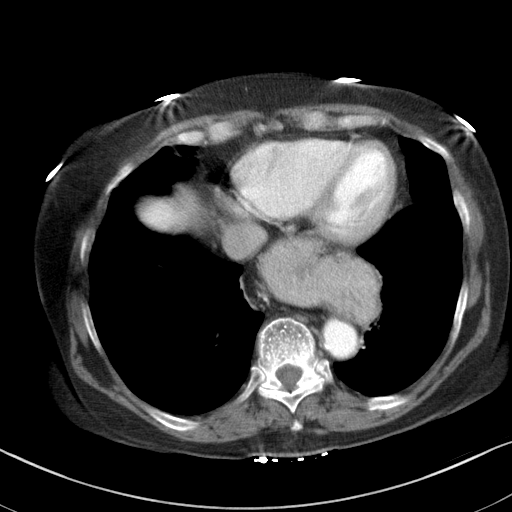

[Series 5: abd/pelvis 3.0 coronal · coronal · 0.68mm/px · 3 of 97 slices shown]
[im 33/97  soft-tissue]
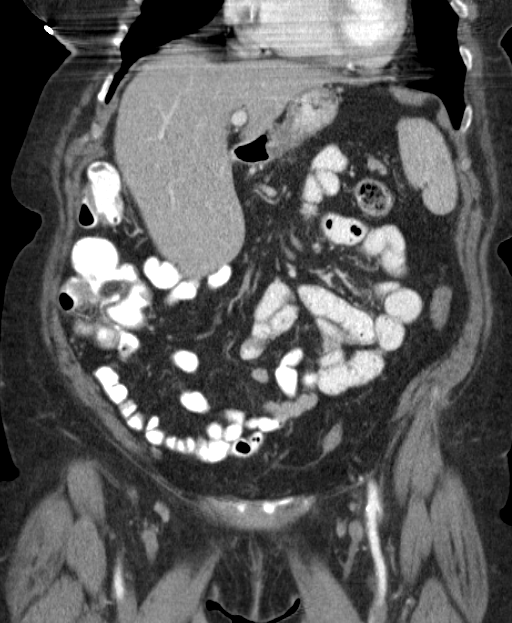
[im 43/97  soft-tissue]
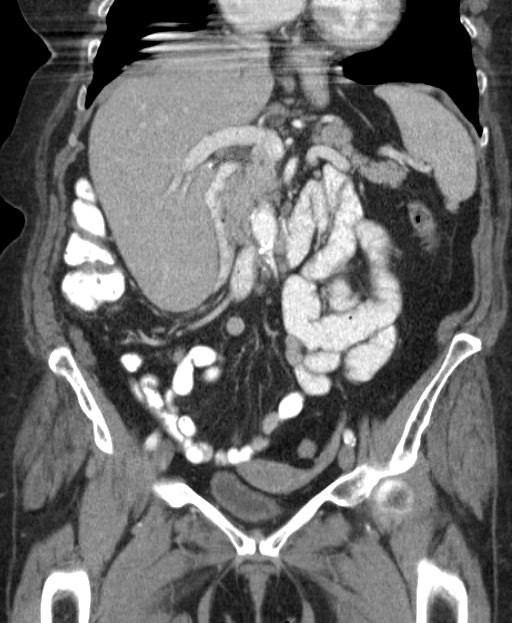
[im 54/97  soft-tissue]
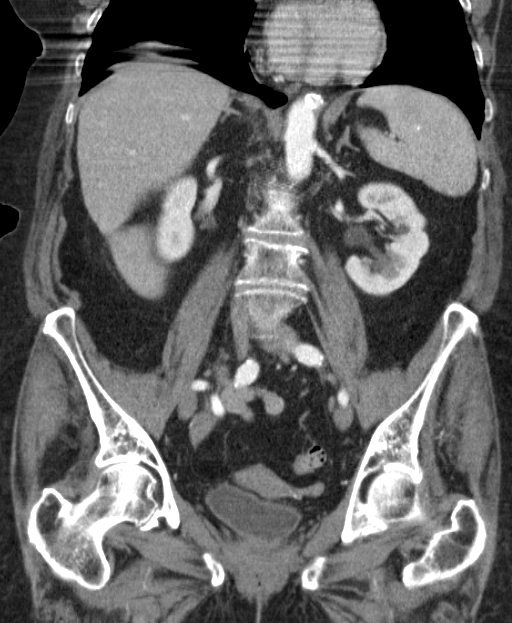

[16 of 46 positions shown; findings below may reference images not displayed]

FINDINGS: Large hiatal hernia is again demonstrated. Spleen and liver are
within normal limits in size. No splenic or hepatic masses are
identified. Surgical clips from prior cholecystectomy noted.

The pancreas, adrenal glands, and kidneys are normal in appearance.
No evidence of hydronephrosis. No soft tissue masses or
lymphadenopathy identified within the abdomen or pelvis except for a
1 cm calcified uterine fibroid which is stable. Adnexal regions are
unremarkable.

No evidence of inflammatory process or abnormal fluid collections.
Diverticulosis is again seen mainly involving the sigmoid colon. No
evidence of diverticulitis. No evidence of bowel wall thickening or
dilatation. No suspicious bone lesions identified. Prior L2
vertebroplasty again noted.
IMPRESSION: No evidence of metastatic disease or other acute findings. No
evidence of splenomegaly.

Large hiatal hernia.

Stable 1 cm calcified uterine fibroid.

Diverticulosis. No radiographic evidence of diverticulitis.

## 2016-01-09 ENCOUNTER — Other Ambulatory Visit (HOSPITAL_BASED_OUTPATIENT_CLINIC_OR_DEPARTMENT_OTHER): Payer: Medicare Other

## 2016-01-09 ENCOUNTER — Ambulatory Visit (HOSPITAL_BASED_OUTPATIENT_CLINIC_OR_DEPARTMENT_OTHER): Payer: Medicare Other | Admitting: Family

## 2016-01-09 ENCOUNTER — Encounter: Payer: Self-pay | Admitting: Family

## 2016-01-09 ENCOUNTER — Other Ambulatory Visit: Payer: Self-pay | Admitting: Family

## 2016-01-09 VITALS — BP 123/63 | HR 59 | Temp 97.9°F | Resp 16 | Ht 66.0 in | Wt 167.0 lb

## 2016-01-09 DIAGNOSIS — Z853 Personal history of malignant neoplasm of breast: Secondary | ICD-10-CM

## 2016-01-09 DIAGNOSIS — D45 Polycythemia vera: Secondary | ICD-10-CM | POA: Diagnosis not present

## 2016-01-09 DIAGNOSIS — C50012 Malignant neoplasm of nipple and areola, left female breast: Secondary | ICD-10-CM

## 2016-01-09 DIAGNOSIS — C50011 Malignant neoplasm of nipple and areola, right female breast: Secondary | ICD-10-CM

## 2016-01-09 DIAGNOSIS — D751 Secondary polycythemia: Secondary | ICD-10-CM

## 2016-01-09 LAB — CBC WITH DIFFERENTIAL (CANCER CENTER ONLY)
BASO#: 0.1 10*3/uL (ref 0.0–0.2)
BASO%: 1.1 % (ref 0.0–2.0)
EOS%: 2.5 % (ref 0.0–7.0)
Eosinophils Absolute: 0.1 10*3/uL (ref 0.0–0.5)
HCT: 38.3 % (ref 34.8–46.6)
HGB: 11.7 g/dL (ref 11.6–15.9)
LYMPH#: 1.2 10*3/uL (ref 0.9–3.3)
LYMPH%: 20.6 % (ref 14.0–48.0)
MCH: 23.8 pg — ABNORMAL LOW (ref 26.0–34.0)
MCHC: 30.5 g/dL — ABNORMAL LOW (ref 32.0–36.0)
MCV: 78 fL — ABNORMAL LOW (ref 81–101)
MONO#: 0.4 10*3/uL (ref 0.1–0.9)
MONO%: 7.6 % (ref 0.0–13.0)
NEUT#: 3.8 10*3/uL (ref 1.5–6.5)
NEUT%: 68.2 % (ref 39.6–80.0)
Platelets: 337 10*3/uL (ref 145–400)
RBC: 4.91 10*6/uL (ref 3.70–5.32)
RDW: 16.3 % — ABNORMAL HIGH (ref 11.1–15.7)
WBC: 5.6 10*3/uL (ref 3.9–10.0)

## 2016-01-09 LAB — COMPREHENSIVE METABOLIC PANEL
ALT: 42 U/L (ref 0–55)
AST: 65 U/L — ABNORMAL HIGH (ref 5–34)
Albumin: 3.5 g/dL (ref 3.5–5.0)
Alkaline Phosphatase: 104 U/L (ref 40–150)
Anion Gap: 8 mEq/L (ref 3–11)
BUN: 6.5 mg/dL — ABNORMAL LOW (ref 7.0–26.0)
CO2: 28 mEq/L (ref 22–29)
Calcium: 9.3 mg/dL (ref 8.4–10.4)
Chloride: 105 mEq/L (ref 98–109)
Creatinine: 0.8 mg/dL (ref 0.6–1.1)
EGFR: 67 mL/min/{1.73_m2} — ABNORMAL LOW (ref >90)
Glucose: 122 mg/dl (ref 70–140)
Potassium: 4.3 mEq/L (ref 3.5–5.1)
Sodium: 141 mEq/L (ref 136–145)
Total Bilirubin: 1.03 mg/dL (ref 0.20–1.20)
Total Protein: 6.4 g/dL (ref 6.4–8.3)

## 2016-01-09 NOTE — Progress Notes (Signed)
Hematology and Oncology Follow Up Visit  [name] [id number] [birth year] 82 y.o. 01/09/2016   Principle Diagnosis:  Polycythemia vera-JAK2 positive Stage I (T1BN0M0 ) infiltrating duct carcinoma the left breast - diagnosed May 2013  Current Therapy:   1. Phlebotomy to maintain hematocrit below 45%. 2. Hydrea 500 mg by mouth every other day 3. Femara 2.5 mg by mouth daily - stopped after 4 years in 08/2015    Interim History:  [name] is here today for a follow-up. She is doing well but has some intermittent fatigue at times. Her Hct today is 38.3 so no phlebotomy needed. Hgb stable at 11.7 with an MCV of 78. Platelet count is 337. She is taking her Hydrea every other day as prescribed.  No episodes of bleeding or bruising. No lymphadenopathy found on exam.  No fever, chills, n/v, cough, rash, dizziness, chest pain, palpitations, abdominal pain or changes in bowel or bladder habits.  No changes with her chest. She still has a mild rash across the chest that comes and goes.  No swelling or tenderness in her extremities. She has varicose veins and wears compression socks as needed for support. She has some numbness and tingling in her feet that comes and goes. This has not effected her gait. She has had no falls and syncopal episodes.  She has maintained a good appetite and is staying well hydrated. Her weight is stable.  She stays busy walking her 2 dogs each day.   Medications:    Medication List       Accurate as of 01/09/16  2:38 PM. Always use your most recent med list.          acetaminophen-codeine 300-30 MG tablet Commonly known as:  TYLENOL #3 Take 1 tablet by mouth every 8 (eight) hours as needed. For migraine   aspirin 81 MG tablet Take 81 mg by mouth daily.   hydrochlorothiazide 25 MG tablet Commonly known as:  HYDRODIURIL Take 1 tablet (25 mg total) by mouth daily as needed (swelling in the legs ).   hydroxyurea 500 MG capsule Commonly known as:   HYDREA Take 500 mg by mouth every other day. May take with food to minimize GI side effects.   LORazepam 0.5 MG tablet Commonly known as:  ATIVAN Take 1 tablet by mouth in the morning and 2 tablets at bedtime as needed.   metoprolol 50 MG tablet Commonly known as:  LOPRESSOR Take 25 mg by mouth 2 (two) times daily.   omeprazole 20 MG capsule Commonly known as:  PRILOSEC Take 20 mg by mouth daily.   simethicone 125 MG chewable tablet Commonly known as:  MYLICON Chew 0000000 mg by mouth every 6 (six) hours as needed for flatulence. Reported on 07/25/2015   Vitamin D 2000 units tablet Take 2,000 Units by mouth daily.   vitamin E 400 UNIT capsule Take 800 Units by mouth daily.       Allergies:  Allergies  Allergen Reactions  . Penicillins Other (See Comments), Rash and Hives    Other Reaction: OTHER REACTION Other Reaction: OTHER REACTION Mouth blisters 03-29-11  . Atorvastatin Diarrhea    REACTION: diarrhe  . Ezetimibe     REACTION: nausea  . Fluvastatin Sodium Other (See Comments)    "Can't remember"  . Magnesium Hydroxide Other (See Comments)    REACTION: triggers HAs  . Meloxicam Other (See Comments)    Pt seeing auro's / spots.    . Omeprazole Magnesium  auras  . Pneumovax [Pneumococcal Polysaccharide Vaccine]     Local reaction  . Quinapril Hcl Other (See Comments)    03-29-11 "feelings of tiredness"  . Simvastatin Diarrhea  . Topamax [Topiramate] Itching  . Valsartan Itching and Other (See Comments)    REACTION: toes itch  . Vit D-Vit E-Safflower Oil Other (See Comments)    Headaches  . Carvedilol     "teeth hurt when I took it"    Past Medical History, Surgical history, Social history, and Family History were reviewed and updated.  Review of Systems: All other 10 point review of systems is negative.   Physical Exam:  height is 5\' 6"  (1.676 m) and weight is 167 lb (75.8 kg). Her oral temperature is 97.9 F (36.6 C). Her blood pressure is 123/63 and  her pulse is 59 (abnormal). Her respiration is 16.   Wt Readings from Last 3 Encounters:  01/09/16 167 lb (75.8 kg)  12/12/15 167 lb 0.6 oz (75.8 kg)  11/11/15 164 lb 1.9 oz (74.4 kg)    Ocular: Sclerae unicteric, pupils equal, round and reactive to light Ear-nose-throat: Oropharynx clear, dentition fair Lymphatic: No cervical supraclavicular or axillary adenopathy Lungs no rales or rhonchi, good excursion bilaterally Heart regular rate and rhythm, no murmur appreciated Abd soft, nontender, positive bowel sounds MSK no focal spinal tenderness, no joint edema Neuro: non-focal, well-oriented, appropriate affect Breasts: Surgical changes from left breast lumpectomy. No changes to right breast. No mass, lesion, rash or lymphadenopathy found on exam.   Lab Results  Component Value Date   WBC 5.6 01/09/2016   HGB 11.7 01/09/2016   HCT 38.3 01/09/2016   MCV 78 (L) 01/09/2016   PLT 337 01/09/2016   Lab Results  Component Value Date   FERRITIN 37 11/11/2015   IRON 36 (L) 11/11/2015   TIBC 418 11/11/2015   UIBC 381 11/11/2015   IRONPCTSAT 9 (L) 11/11/2015   Lab Results  Component Value Date   RETICCTPCT 0.8 02/02/2015   RBC 4.91 01/09/2016   RETICCTABS 41.4 02/02/2015   No results found for: KPAFRELGTCHN, LAMBDASER, KAPLAMBRATIO No results found for: IGGSERUM, IGA, IGMSERUM No results found for: Odetta Pink, SPEI   Chemistry      Component Value Date/Time   NA 137 10/19/2015 1108   NA 138 09/02/2015 0925   K 5.6 (H) 10/19/2015 1108   K 4.2 09/02/2015 0925   CL 99 10/19/2015 1108   CL 101 06/25/2013 1356   CO2 30 10/19/2015 1108   CO2 27 09/02/2015 0925   BUN 11 10/19/2015 1108   BUN 11.1 09/02/2015 0925   CREATININE 0.89 10/19/2015 1108   CREATININE 1.0 09/02/2015 0925      Component Value Date/Time   CALCIUM 10.2 10/19/2015 1108   CALCIUM 10.1 09/02/2015 0925   ALKPHOS 87 10/19/2015 1108   ALKPHOS 86  09/02/2015 0925   AST 18 10/19/2015 1108   AST 20 09/02/2015 0925   ALT 11 10/19/2015 1108   ALT 9 09/02/2015 0925   BILITOT 1.9 (H) 10/19/2015 1108   BILITOT 1.60 (H) 09/02/2015 0925     Impression and Plan: [name] is a 82 year old white female with polycythemia and history of stage 1 infiltrating ductal carcinoma of the left breast with lumpectomy in 2013. She is doing well and completed 4 years of Femara in April 2017. So far, there has been no evidence of recurrence.  She is taking her hydrea as prescribed and platelet count  at this time is 337. She willc otninue on her same dose.  Her Hct today is 38.3 so no phlebotomy needed. We will plan to see her back in 1 month for repeat labs and follow-up.  She knows to call here with any questions or concerns. We can certainly see her sooner if need be.    Eliezer Bottom, NP 8/28/20172:38 PM

## 2016-01-10 LAB — IRON AND TIBC
%SAT: 4 % — ABNORMAL LOW (ref 21–57)
Iron: 18 ug/dL — ABNORMAL LOW (ref 41–142)
TIBC: 404 ug/dL (ref 236–444)
UIBC: 386 ug/dL — ABNORMAL HIGH (ref 120–384)

## 2016-01-10 LAB — FERRITIN: Ferritin: 35 ng/ml (ref 9–269)

## 2016-01-17 ENCOUNTER — Other Ambulatory Visit: Payer: Self-pay | Admitting: Internal Medicine

## 2016-01-17 NOTE — Telephone Encounter (Signed)
Pt is requesting refill on Lorazepam.  Last OV: 09/27/2015 Last Fill: 09/09/2015 #90 and 2 RF (Pt sig: 1 tablet in AM and 2 tablets in PM PRN) UDS: 06/01/2015 Low risk  Please advise.

## 2016-01-17 NOTE — Telephone Encounter (Signed)
Rx printed, awaiting MD signature.  

## 2016-01-17 NOTE — Telephone Encounter (Signed)
Rx faxed to [facility] pharmacy.  

## 2016-01-17 NOTE — Telephone Encounter (Signed)
Okay #90 and 2 refills 

## 2016-01-30 DIAGNOSIS — I83813 Varicose veins of bilateral lower extremities with pain: Secondary | ICD-10-CM | POA: Diagnosis not present

## 2016-02-10 ENCOUNTER — Ambulatory Visit (HOSPITAL_BASED_OUTPATIENT_CLINIC_OR_DEPARTMENT_OTHER): Payer: Medicare Other

## 2016-02-10 ENCOUNTER — Encounter: Payer: Self-pay | Admitting: Family

## 2016-02-10 ENCOUNTER — Other Ambulatory Visit (HOSPITAL_BASED_OUTPATIENT_CLINIC_OR_DEPARTMENT_OTHER): Payer: Medicare Other

## 2016-02-10 ENCOUNTER — Ambulatory Visit (HOSPITAL_BASED_OUTPATIENT_CLINIC_OR_DEPARTMENT_OTHER): Payer: Medicare Other | Admitting: Family

## 2016-02-10 VITALS — BP 139/62 | HR 60 | Temp 97.4°F | Resp 16 | Ht 66.0 in | Wt 168.0 lb

## 2016-02-10 DIAGNOSIS — Z853 Personal history of malignant neoplasm of breast: Secondary | ICD-10-CM

## 2016-02-10 DIAGNOSIS — Z23 Encounter for immunization: Secondary | ICD-10-CM | POA: Diagnosis not present

## 2016-02-10 DIAGNOSIS — D751 Secondary polycythemia: Secondary | ICD-10-CM

## 2016-02-10 DIAGNOSIS — D45 Polycythemia vera: Secondary | ICD-10-CM

## 2016-02-10 DIAGNOSIS — C50012 Malignant neoplasm of nipple and areola, left female breast: Secondary | ICD-10-CM

## 2016-02-10 LAB — COMPREHENSIVE METABOLIC PANEL
ALT: 12 U/L (ref 0–55)
AST: 24 U/L (ref 5–34)
Albumin: 3.7 g/dL (ref 3.5–5.0)
Alkaline Phosphatase: 80 U/L (ref 40–150)
Anion Gap: 8 mEq/L (ref 3–11)
BUN: 9.4 mg/dL (ref 7.0–26.0)
CO2: 26 mEq/L (ref 22–29)
Calcium: 9.4 mg/dL (ref 8.4–10.4)
Chloride: 104 mEq/L (ref 98–109)
Creatinine: 0.8 mg/dL (ref 0.6–1.1)
EGFR: 66 mL/min/{1.73_m2} — ABNORMAL LOW (ref >90)
Glucose: 86 mg/dl (ref 70–140)
Potassium: 4.4 mEq/L (ref 3.5–5.1)
Sodium: 138 mEq/L (ref 136–145)
Total Bilirubin: 1.25 mg/dL — ABNORMAL HIGH (ref 0.20–1.20)
Total Protein: 6.9 g/dL (ref 6.4–8.3)

## 2016-02-10 LAB — CBC WITH DIFFERENTIAL (CANCER CENTER ONLY)
BASO#: 0.1 10*3/uL (ref 0.0–0.2)
BASO%: 0.7 % (ref 0.0–2.0)
EOS%: 2.2 % (ref 0.0–7.0)
Eosinophils Absolute: 0.2 10*3/uL (ref 0.0–0.5)
HCT: 38.3 % (ref 34.8–46.6)
HGB: 11.8 g/dL (ref 11.6–15.9)
LYMPH#: 1.1 10*3/uL (ref 0.9–3.3)
LYMPH%: 14.9 % (ref 14.0–48.0)
MCH: 23.6 pg — ABNORMAL LOW (ref 26.0–34.0)
MCHC: 30.8 g/dL — ABNORMAL LOW (ref 32.0–36.0)
MCV: 77 fL — ABNORMAL LOW (ref 81–101)
MONO#: 0.7 10*3/uL (ref 0.1–0.9)
MONO%: 9.5 % (ref 0.0–13.0)
NEUT#: 5.3 10*3/uL (ref 1.5–6.5)
NEUT%: 72.7 % (ref 39.6–80.0)
Platelets: 412 10*3/uL — ABNORMAL HIGH (ref 145–400)
RBC: 4.99 10*6/uL (ref 3.70–5.32)
RDW: 16.9 % — ABNORMAL HIGH (ref 11.1–15.7)
WBC: 7.2 10*3/uL (ref 3.9–10.0)

## 2016-02-10 MED ORDER — INFLUENZA VAC SPLIT QUAD 0.5 ML IM SUSY
0.5000 mL | PREFILLED_SYRINGE | Freq: Once | INTRAMUSCULAR | Status: AC
  Administered 2016-02-10: 0.5 mL via INTRAMUSCULAR
  Filled 2016-02-10: qty 0.5

## 2016-02-10 NOTE — Patient Instructions (Signed)
Influenza Virus Vaccine injection What is this medicine? INFLUENZA VIRUS VACCINE (in floo EN zuh VAHY ruhs vak SEEN) helps to reduce the risk of getting influenza also known as the flu. The vaccine only helps protect you against some strains of the flu. This medicine may be used for other purposes; ask your health care provider or pharmacist if you have questions. What should I tell my health care provider before I take this medicine? They need to know if you have any of these conditions: -bleeding disorder like hemophilia -fever or infection -Guillain-Barre syndrome or other neurological problems -immune system problems -infection with the human immunodeficiency virus (HIV) or AIDS -low blood platelet counts -multiple sclerosis -an unusual or allergic reaction to influenza virus vaccine, latex, other medicines, foods, dyes, or preservatives. Different brands of vaccines contain different allergens. Some may contain latex or eggs. Talk to your doctor about your allergies to make sure that you get the right vaccine. -pregnant or trying to get pregnant -breast-feeding How should I use this medicine? This vaccine is for injection into a muscle or under the skin. It is given by a health care professional. A copy of Vaccine Information Statements will be given before each vaccination. Read this sheet carefully each time. The sheet may change frequently. Talk to your healthcare provider to see which vaccines are right for you. Some vaccines should not be used in all age groups. Overdosage: If you think you have taken too much of this medicine contact a poison control center or emergency room at once. NOTE: This medicine is only for you. Do not share this medicine with others. What if I miss a dose? This does not apply. What may interact with this medicine? -chemotherapy or radiation therapy -medicines that lower your immune system like etanercept, anakinra, infliximab, and adalimumab -medicines  that treat or prevent blood clots like warfarin -phenytoin -steroid medicines like prednisone or cortisone -theophylline -vaccines This list may not describe all possible interactions. Give your health care provider a list of all the medicines, herbs, non-prescription drugs, or dietary supplements you use. Also tell them if you smoke, drink alcohol, or use illegal drugs. Some items may interact with your medicine. What should I watch for while using this medicine? Report any side effects that do not go away within 3 days to your doctor or health care professional. Call your health care provider if any unusual symptoms occur within 6 weeks of receiving this vaccine. You may still catch the flu, but the illness is not usually as bad. You cannot get the flu from the vaccine. The vaccine will not protect against colds or other illnesses that may cause fever. The vaccine is needed every year. What side effects may I notice from receiving this medicine? Side effects that you should report to your doctor or health care professional as soon as possible: -allergic reactions like skin rash, itching or hives, swelling of the face, lips, or tongue Side effects that usually do not require medical attention (report to your doctor or health care professional if they continue or are bothersome): -fever -headache -muscle aches and pains -pain, tenderness, redness, or swelling at the injection site -tiredness This list may not describe all possible side effects. Call your doctor for medical advice about side effects. You may report side effects to FDA at 1-800-FDA-1088. Where should I keep my medicine? The vaccine will be given by a health care professional in a clinic, pharmacy, doctor's office, or other health care setting. You will not   be given vaccine doses to store at home. NOTE: This sheet is a summary. It may not cover all possible information. If you have questions about this medicine, talk to your  doctor, pharmacist, or health care provider.    2016, [employer]. (2014-11-19 10:07:28)  

## 2016-02-10 NOTE — Progress Notes (Signed)
Hematology and Oncology Follow Up Visit  [name] [id number] [birth year] 82 y.o. 02/10/2016   Principle Diagnosis:  Polycythemia vera-JAK2 positive Stage I (T1BN0M0 ) infiltrating duct carcinoma the left breast - diagnosed May 2013  Current Therapy:   1. Phlebotomy to maintain hematocrit below 45%. 2. Hydrea 500 mg by mouth every other day 3. Femara 2.5 mg by mouth daily - finished 4 years in 08/2015    Interim History:  [name] is here today for a follow-up. She is doing well but did have a fall last week. She tripped over her dog but thankfully was not injured.  She has had some issue with left leg spasms and has an appointment for [place] and with [facility] on Monday 10/1. Despite this she is staying active walking her dogs and doing water aerobics twice a week.  She has done well on Hydrea and platelet count today is stable at 412. She has had no episodes of bleeding, bruising or petechiae. Hgb is 38.3.  No fever, chills, n/v, cough, dizziness, chest pain, palpitations, abdominal pain or changes in bowel or bladder habits.  She has a faint maculopapular rash on her chest. This is not a new issue. She has been dealing with it for years and uses a special cream. She sees dermatology and is currently due for her next visit.  No changes with her chest. No lymphadenopathy found on exam.  No swelling or tenderness in her extremities. She wears compression socks as needed for support.  She has maintained a good appetite and is staying well hydrated. Her weight is stable.   Medications:    Medication List       Accurate as of 02/10/16  1:46 PM. Always use your most recent med list.          acetaminophen-codeine 300-30 MG tablet Commonly known as:  TYLENOL #3 Take 1 tablet by mouth every 8 (eight) hours as needed. For migraine   aspirin 81 MG tablet Take 81 mg by mouth daily.   hydrochlorothiazide 25 MG tablet Commonly known as:  HYDRODIURIL Take 1  tablet (25 mg total) by mouth daily as needed (swelling in the legs ).   hydroxyurea 500 MG capsule Commonly known as:  HYDREA Take 500 mg by mouth every other day. May take with food to minimize GI side effects.   LORazepam 0.5 MG tablet Commonly known as:  ATIVAN Take 1 tablet by mouth every morning and 2 tablets by mouth at bedtime if needed   metoprolol 50 MG tablet Commonly known as:  LOPRESSOR Take 25 mg by mouth 2 (two) times daily.   omeprazole 20 MG capsule Commonly known as:  PRILOSEC Take 20 mg by mouth daily.   simethicone 125 MG chewable tablet Commonly known as:  MYLICON Chew 0000000 mg by mouth every 6 (six) hours as needed for flatulence. Reported on 07/25/2015   Vitamin D 2000 units tablet Take 2,000 Units by mouth daily.   vitamin E 400 UNIT capsule Take 800 Units by mouth daily.       Allergies:  Allergies  Allergen Reactions  . Penicillins Other (See Comments), Rash and Hives    Other Reaction: OTHER REACTION Other Reaction: OTHER REACTION Mouth blisters 03-29-11  . Atorvastatin Diarrhea    REACTION: diarrhe  . Ezetimibe     REACTION: nausea  . Fluvastatin Sodium Other (See Comments)    "Can't remember"  . Magnesium Hydroxide Other (See Comments)    REACTION: triggers HAs  .  Meloxicam Other (See Comments)    Pt seeing auro's / spots.    . Omeprazole Magnesium     auras  . Pneumovax [Pneumococcal Polysaccharide Vaccine]     Local reaction  . Quinapril Hcl Other (See Comments)    03-29-11 "feelings of tiredness"  . Simvastatin Diarrhea  . Topamax [Topiramate] Itching  . Valsartan Itching and Other (See Comments)    REACTION: toes itch  . Vit D-Vit E-Safflower Oil Other (See Comments)    Headaches  . Carvedilol     "teeth hurt when I took it"    Past Medical History, Surgical history, Social history, and Family History were reviewed and updated.  Review of Systems: All other 10 point review of systems is negative.   Physical Exam:   height is 5\' 6"  (1.676 m) and weight is 168 lb (76.2 kg). Her oral temperature is 97.4 F (36.3 C). Her blood pressure is 139/62 and her pulse is 60. Her respiration is 16.   Wt Readings from Last 3 Encounters:  02/10/16 168 lb (76.2 kg)  01/09/16 167 lb (75.8 kg)  12/12/15 167 lb 0.6 oz (75.8 kg)    Ocular: Sclerae unicteric, pupils equal, round and reactive to light Ear-nose-throat: Oropharynx clear, dentition fair Lymphatic: No cervical supraclavicular or axillary adenopathy Lungs no rales or rhonchi, good excursion bilaterally Heart regular rate and rhythm, no murmur appreciated Abd soft, nontender, positive bowel sounds MSK no focal spinal tenderness, no joint edema Neuro: non-focal, well-oriented, appropriate affect Breasts: Surgical changes from left breast lumpectomy. No changes to right breast. No mass, lesion, rash or lymphadenopathy found on exam.   Lab Results  Component Value Date   WBC 7.2 02/10/2016   HGB 11.8 02/10/2016   HCT 38.3 02/10/2016   MCV 77 (L) 02/10/2016   PLT 412 (H) 02/10/2016   Lab Results  Component Value Date   FERRITIN 35 01/09/2016   IRON 18 (L) 01/09/2016   TIBC 404 01/09/2016   UIBC 386 (H) 01/09/2016   IRONPCTSAT 4 (L) 01/09/2016   Lab Results  Component Value Date   RETICCTPCT 0.8 02/02/2015   RBC 4.99 02/10/2016   RETICCTABS 41.4 02/02/2015   No results found for: KPAFRELGTCHN, LAMBDASER, KAPLAMBRATIO No results found for: IGGSERUM, IGA, IGMSERUM No results found for: Odetta Pink, SPEI   Chemistry      Component Value Date/Time   NA 141 01/09/2016 1303   K 4.3 01/09/2016 1303   CL 99 10/19/2015 1108   CL 101 06/25/2013 1356   CO2 28 01/09/2016 1303   BUN 6.5 (L) 01/09/2016 1303   CREATININE 0.8 01/09/2016 1303      Component Value Date/Time   CALCIUM 9.3 01/09/2016 1303   ALKPHOS 104 01/09/2016 1303   AST 65 (H) 01/09/2016 1303   ALT 42 01/09/2016 1303   BILITOT  1.03 01/09/2016 1303     Impression and Plan: [name] is a 82 year old white female with polycythemia and history of stage 1 infiltrating ductal carcinoma of the left breast with lumpectomy in 2013. She is doing well and completed 4 years of Femara in April 2017. So far, there has been no evidence of recurrence.  She continues to do well and has no complaints at this time. She has responded nicely to [facility] and her platelet count is stable at 412. She will continue on her same dose.  Hgb is 38.3 so she will not need a phlebotomy at this time.  We will  plan to see her back in 3 months for repeat labs and follow-up.  She knows to call here with any questions or concerns. We can certainly see her sooner if need be.    Eliezer Bottom, NP 9/29/20171:46 PM

## 2016-02-13 LAB — IRON AND TIBC
%SAT: 7 % — ABNORMAL LOW (ref 21–57)
Iron: 29 ug/dL — ABNORMAL LOW (ref 41–142)
TIBC: 424 ug/dL (ref 236–444)
UIBC: 394 ug/dL — ABNORMAL HIGH (ref 120–384)

## 2016-02-13 LAB — FERRITIN: Ferritin: 30 ng/ml (ref 9–269)

## 2016-02-15 DIAGNOSIS — I83813 Varicose veins of bilateral lower extremities with pain: Secondary | ICD-10-CM | POA: Diagnosis not present

## 2016-02-28 DIAGNOSIS — I83893 Varicose veins of bilateral lower extremities with other complications: Secondary | ICD-10-CM | POA: Diagnosis not present

## 2016-02-28 DIAGNOSIS — I83813 Varicose veins of bilateral lower extremities with pain: Secondary | ICD-10-CM | POA: Diagnosis not present

## 2016-03-12 ENCOUNTER — Other Ambulatory Visit: Payer: Self-pay | Admitting: Internal Medicine

## 2016-03-22 IMAGING — MR MR PELVIS W/O CM
5 series · 45 of 48 positions shown · non-contrast
Comparison: MR ADRIANNA SPINE W/O dated 09/06/2013

CLINICAL DATA: Fall.  Groin pain.

EXAM:
MRI PELVIS WITHOUT CONTRAST
TECHNIQUE: Multiplanar multisequence MR imaging of the pelvis was performed. No
intravenous contrast was administered.

[Series 3: T1 · coronal · 5.0mm · 1.41mm/px · 7 of 22 slices shown (1 of 2)]
[im 1/22]
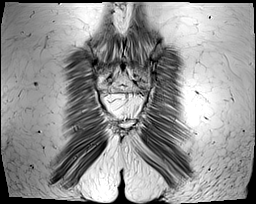
[im 4/22]
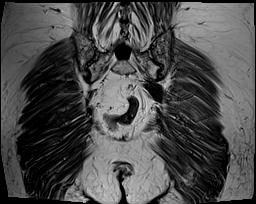
[im 8/22]
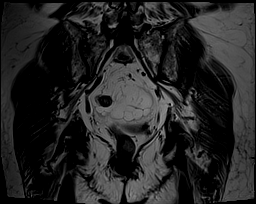
[im 11/22]
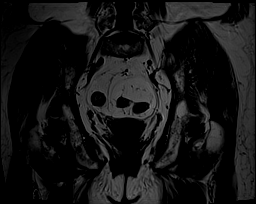
[im 15/22]
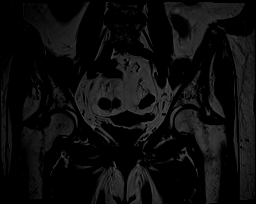
[im 18/22]
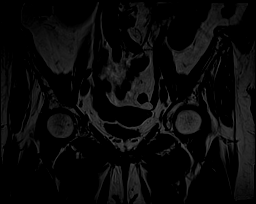
[im 22/22]
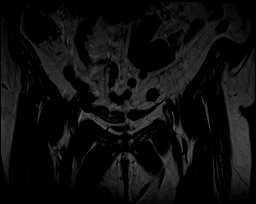

[Series 4: STIR · coronal · 5.0mm · 1.41mm/px · 8 of 22 slices shown]
[im 1/22]
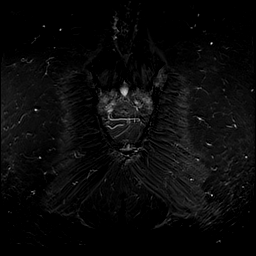
[im 4/22]
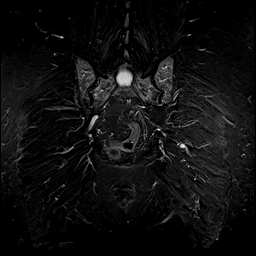
[im 7/22]
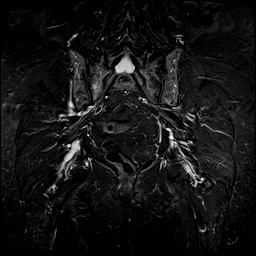
[im 10/22]
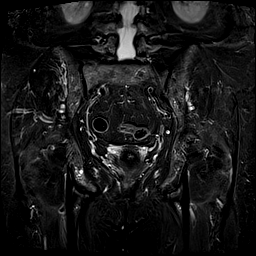
[im 13/22]
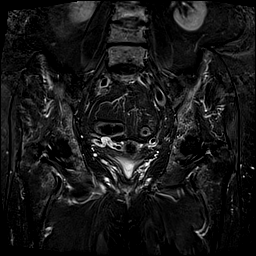
[im 16/22]
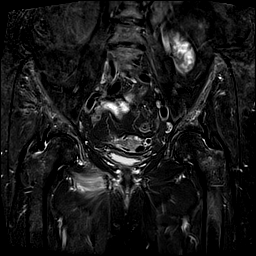
[im 19/22]
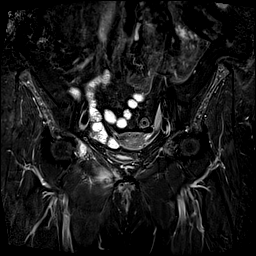
[im 22/22]
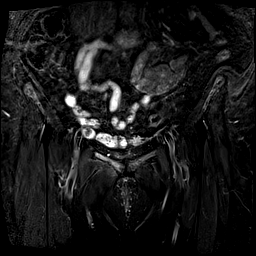

[Series 5: T2 fat-sat · axial · 6.0mm · 0.74mm/px · z∈[-124,+103]mm · 10 of 28 slices shown (1 of 2)]
[im 1/28]
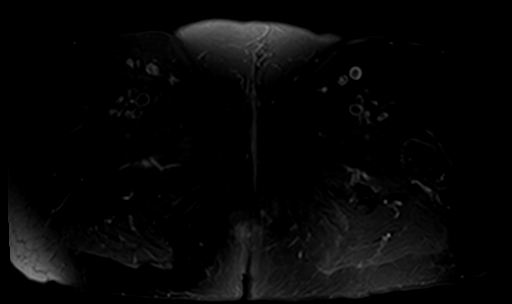
[im 4/28]
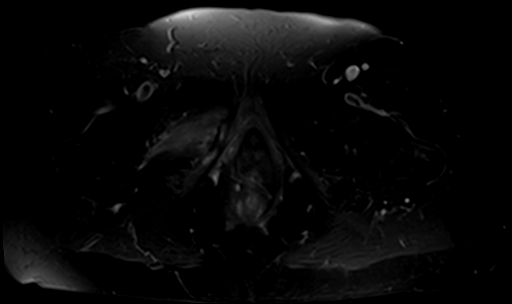
[im 7/28]
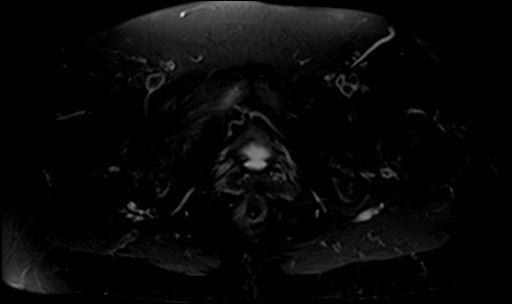
[im 10/28]
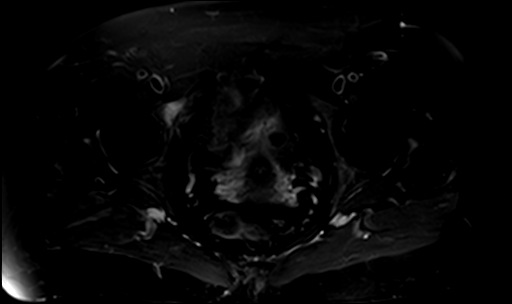
[im 13/28]
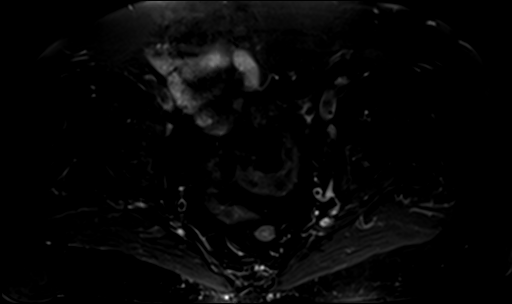
[im 16/28]
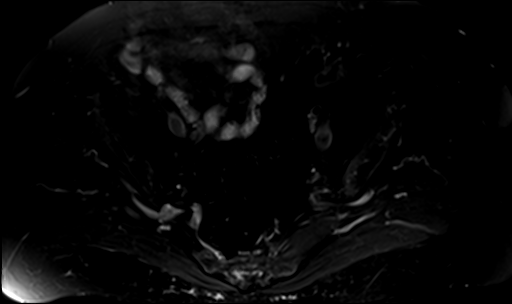
[im 19/28]
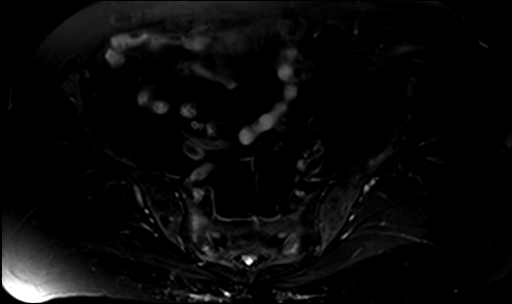
[im 22/28]
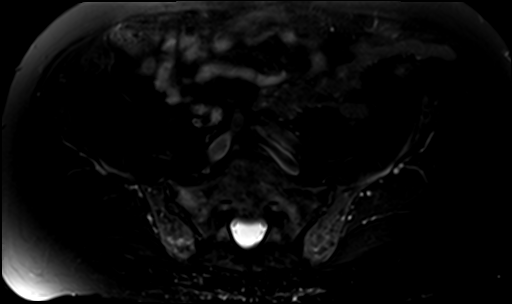
[im 25/28]
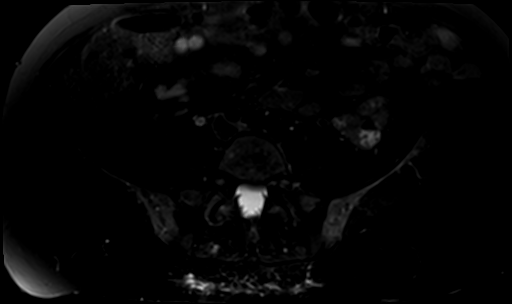
[im 28/28]
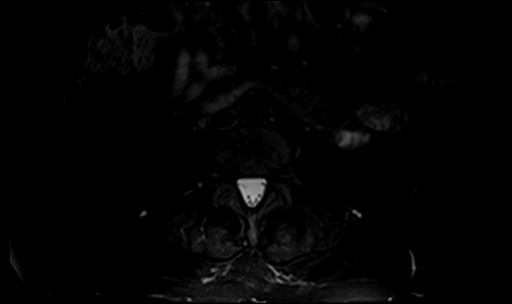

[Series 6: T1 · axial · 6.0mm · 0.74mm/px · z∈[-124,+103]mm · 10 of 28 slices shown (2 of 2)]
[im 1/28]
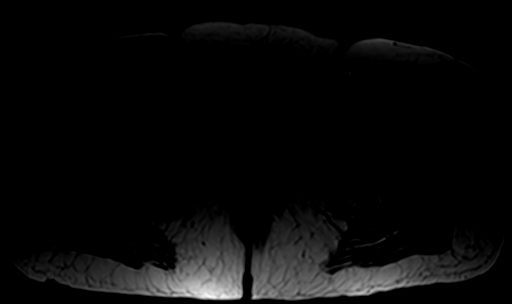
[im 4/28]
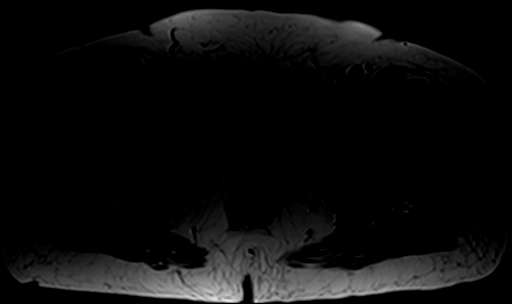
[im 7/28]
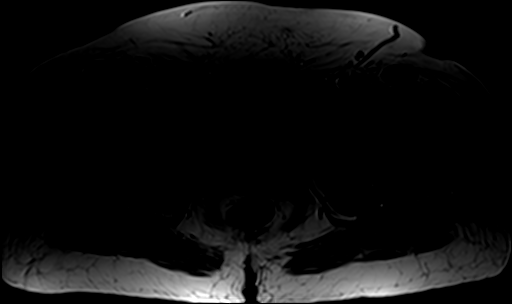
[im 10/28]
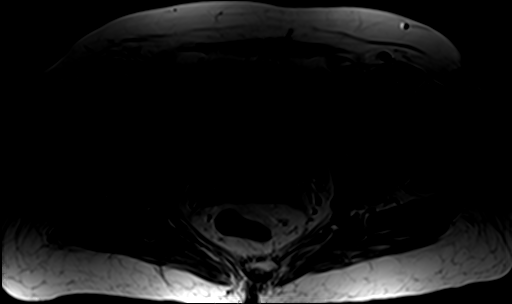
[im 13/28]
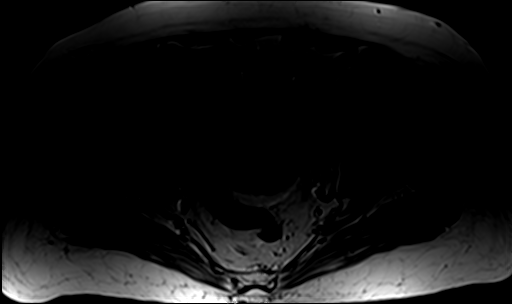
[im 16/28]
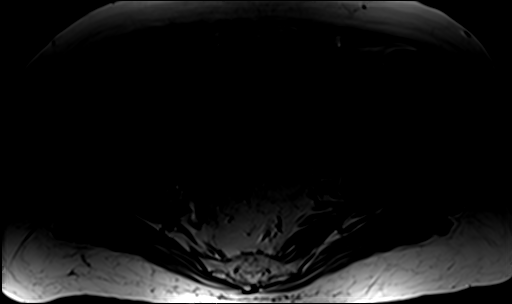
[im 19/28]
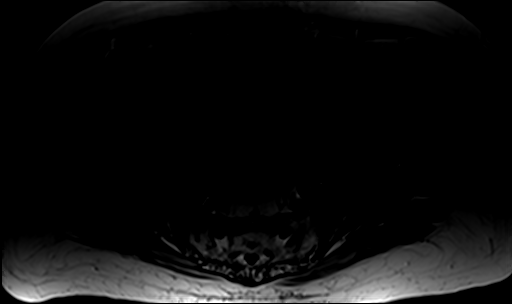
[im 22/28]
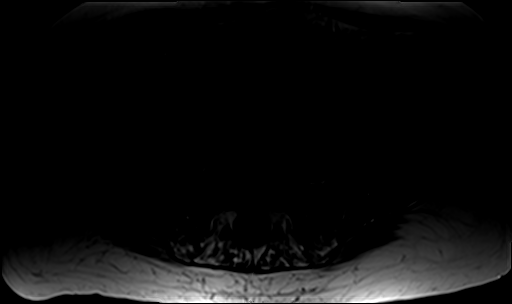
[im 25/28]
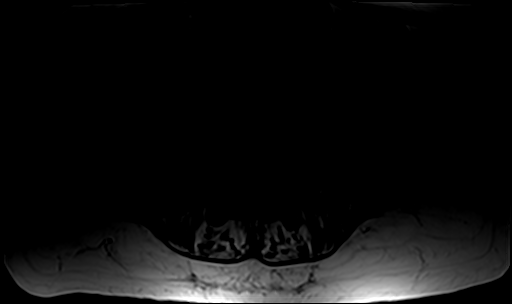
[im 28/28]
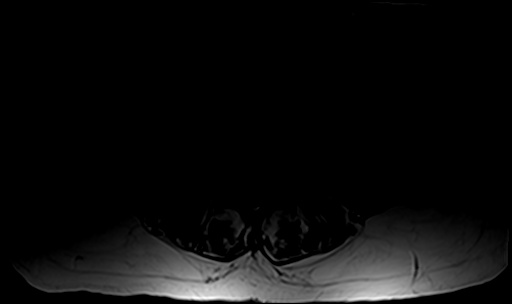

[Series 7: T2 fat-sat · sagittal · 6.0mm · 1.17mm/px · 10 of 38 slices shown (2 of 2)]
[im 1/38]
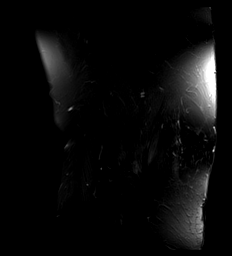
[im 4/38]
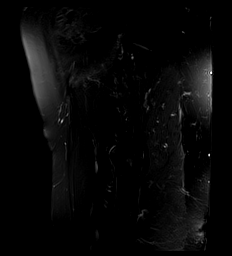
[im 7/38]
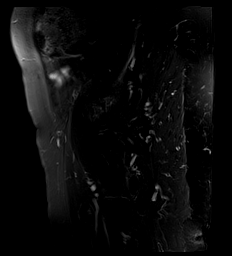
[im 10/38]
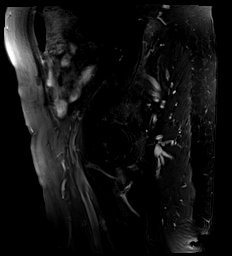
[im 13/38]
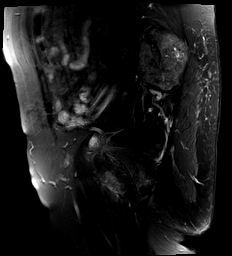
[im 16/38]
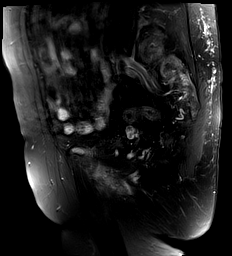
[im 22/38]
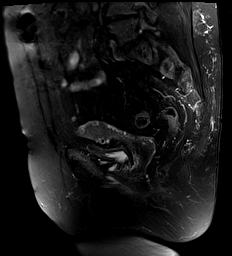
[im 25/38]
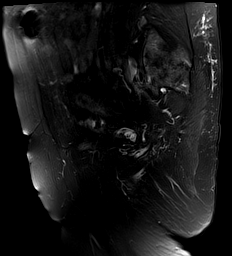
[im 31/38]
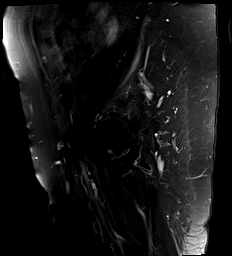
[im 38/38]
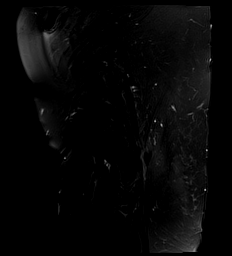

[45 of 48 positions shown; findings below may reference images not displayed]

FINDINGS: Nondisplaced fractures of the right obturator ring are present.
There is bone marrow edema in the root of the superior pubic ramus
and parasymphyseal pubis. Nondisplaced fracture of the inferior
pubic ramus is present with radiating muscular edema in the adductor
compartment. There is no obturator hematoma. There is also a
complementary right sacral ala fracture with sclerotic fracture line
on T1 weighted imaging and radiating bone marrow edema.

The left obturator ring appears intact. Bone marrow signal shows
heterogenous marrow. This is a nonspecific finding most commonly
associated with obesity, anemia, cigarette smoking or chronic
disease.

Partially visualized lumbar spine degenerative disease and
compression fractures which will be described in conjunction with
lumbar spine dictation. Small right hip effusion. The femoral necks
are intact. No large paralabral cyst. Mild bilateral hip
osteoarthritis.

Bone marrow edema is present in the left sacral ala inferiorly
immediately adjacent to the left SI joint. This is compatible with a
small contralateral sacral insufficiency fracture with the fracture
plane identified on T1 weighted imaging.
IMPRESSION: 1. Nondisplaced right obturator ring fractures and complementary
right sacral ala fracture.
2. Tiny inferior left sacral ala insufficiency fracture.
3. Reactive muscular edema in the right adductor compartment.
4. Small likely degenerative or posttraumatic right hip effusion. No
femoral fracture.

## 2016-03-28 ENCOUNTER — Ambulatory Visit: Payer: Medicare Other | Admitting: Internal Medicine

## 2016-04-04 DIAGNOSIS — Z23 Encounter for immunization: Secondary | ICD-10-CM | POA: Diagnosis not present

## 2016-04-04 DIAGNOSIS — D485 Neoplasm of uncertain behavior of skin: Secondary | ICD-10-CM | POA: Diagnosis not present

## 2016-04-04 DIAGNOSIS — L309 Dermatitis, unspecified: Secondary | ICD-10-CM | POA: Diagnosis not present

## 2016-04-09 DIAGNOSIS — L570 Actinic keratosis: Secondary | ICD-10-CM | POA: Diagnosis not present

## 2016-04-10 ENCOUNTER — Other Ambulatory Visit (HOSPITAL_BASED_OUTPATIENT_CLINIC_OR_DEPARTMENT_OTHER): Payer: Medicare Other

## 2016-04-10 ENCOUNTER — Ambulatory Visit (HOSPITAL_BASED_OUTPATIENT_CLINIC_OR_DEPARTMENT_OTHER): Payer: Medicare Other | Admitting: Hematology & Oncology

## 2016-04-10 VITALS — BP 139/77 | HR 64 | Temp 97.8°F | Resp 20 | Wt 170.8 lb

## 2016-04-10 DIAGNOSIS — D751 Secondary polycythemia: Secondary | ICD-10-CM

## 2016-04-10 DIAGNOSIS — D45 Polycythemia vera: Secondary | ICD-10-CM | POA: Diagnosis present

## 2016-04-10 DIAGNOSIS — B0223 Postherpetic polyneuropathy: Secondary | ICD-10-CM

## 2016-04-10 DIAGNOSIS — C50012 Malignant neoplasm of nipple and areola, left female breast: Secondary | ICD-10-CM

## 2016-04-10 DIAGNOSIS — C50011 Malignant neoplasm of nipple and areola, right female breast: Secondary | ICD-10-CM

## 2016-04-10 DIAGNOSIS — C50912 Malignant neoplasm of unspecified site of left female breast: Secondary | ICD-10-CM | POA: Diagnosis not present

## 2016-04-10 LAB — CBC WITH DIFFERENTIAL (CANCER CENTER ONLY)
BASO#: 0.1 10*3/uL (ref 0.0–0.2)
BASO%: 1.6 % (ref 0.0–2.0)
EOS%: 3.2 % (ref 0.0–7.0)
Eosinophils Absolute: 0.2 10*3/uL (ref 0.0–0.5)
HCT: 39.1 % (ref 34.8–46.6)
HGB: 12.1 g/dL (ref 11.6–15.9)
LYMPH#: 0.9 10*3/uL (ref 0.9–3.3)
LYMPH%: 16.1 % (ref 14.0–48.0)
MCH: 23.4 pg — ABNORMAL LOW (ref 26.0–34.0)
MCHC: 30.9 g/dL — ABNORMAL LOW (ref 32.0–36.0)
MCV: 76 fL — ABNORMAL LOW (ref 81–101)
MONO#: 0.6 10*3/uL (ref 0.1–0.9)
MONO%: 10.3 % (ref 0.0–13.0)
NEUT#: 3.9 10*3/uL (ref 1.5–6.5)
NEUT%: 68.8 % (ref 39.6–80.0)
Platelets: 395 10*3/uL (ref 145–400)
RBC: 5.18 10*6/uL (ref 3.70–5.32)
RDW: 18.1 % — ABNORMAL HIGH (ref 11.1–15.7)
WBC: 5.6 10*3/uL (ref 3.9–10.0)

## 2016-04-10 LAB — COMPREHENSIVE METABOLIC PANEL
ALT: 13 U/L (ref 0–55)
AST: 19 U/L (ref 5–34)
Albumin: 3.7 g/dL (ref 3.5–5.0)
Alkaline Phosphatase: 85 U/L (ref 40–150)
Anion Gap: 7 mEq/L (ref 3–11)
BUN: 10 mg/dL (ref 7.0–26.0)
CO2: 29 mEq/L (ref 22–29)
Calcium: 9.5 mg/dL (ref 8.4–10.4)
Chloride: 102 mEq/L (ref 98–109)
Creatinine: 0.8 mg/dL (ref 0.6–1.1)
EGFR: 64 mL/min/{1.73_m2} — ABNORMAL LOW (ref >90)
Glucose: 107 mg/dl (ref 70–140)
Potassium: 4.2 mEq/L (ref 3.5–5.1)
Sodium: 139 mEq/L (ref 136–145)
Total Bilirubin: 1.26 mg/dL — ABNORMAL HIGH (ref 0.20–1.20)
Total Protein: 6.8 g/dL (ref 6.4–8.3)

## 2016-04-10 LAB — IRON AND TIBC
%SAT: 5 % — ABNORMAL LOW (ref 21–57)
Iron: 22 ug/dL — ABNORMAL LOW (ref 41–142)
TIBC: 430 ug/dL (ref 236–444)
UIBC: 409 ug/dL — ABNORMAL HIGH (ref 120–384)

## 2016-04-10 LAB — FERRITIN: Ferritin: 33 ng/ml (ref 9–269)

## 2016-04-10 MED ORDER — CAPSAICIN 0.075 % EX CREA: 1.0000 "application " | TOPICAL_CREAM | Freq: Two times a day (BID) | CUTANEOUS | 0 refills | Status: DC

## 2016-04-10 NOTE — Progress Notes (Signed)
Hematology and Oncology Follow Up Visit  [name]:[id number] 1929/10/31 82 y.o. 04/10/2016   Principle Diagnosis:   Polycythemia vera-JAK2 positive  Stage I (T1bN0M0 ) infiltrating duct carcinoma the left breast   Current Therapy:    #1 phlebotomy to maintain hematocrit below 45%.  #2 Hydrea 500 mg by mouth every other day.  #3 Femara 2.5 mg by mouth daily - stopped 08/2015     Interim History:  Ms.  Parrish is back for followup. This is actually an early visit for her. She has burning pain above the lip. This is associated with a healing macular lesion between the lip and nares. This is just left of the midline. This looks like healing shingles. She apparently went to her family doctor. She said that a biopsy was done and came back negative for cancer or infection. I am not sure as to exactly what time a biopsy could've been done.  Otherwise, she is doing okay. She is worried that this was from her polycythemia area and I try to reassure her that this is not from her polycythemia as this appears to be very well controlled area did  Her breast cancer is not an issue. She now is for half years from diagnosis.  She had a nice Thanksgiving. She was with family. She is looking for due to Christmas.   Currently, her performance status is ECOG 2.   Medications:  Current Outpatient Prescriptions:  .  acetaminophen-codeine (TYLENOL #3) 300-30 MG per tablet, Take 1 tablet by mouth every 8 (eight) hours as needed. For migraine, Disp: 120 tablet, Rfl: 0 .  aspirin 81 MG tablet, Take 81 mg by mouth daily., Disp: , Rfl:  .  capsicum (ZOSTRIX) 0.075 % topical cream, Apply 1 application topically 2 (two) times daily., Disp: 28.3 g, Rfl: 0 .  Cholecalciferol (VITAMIN D) 2000 UNITS tablet, Take 2,000 Units by mouth daily., Disp: , Rfl:  .  hydrochlorothiazide (HYDRODIURIL) 25 MG tablet, Take 1 tablet (25 mg total) by mouth daily as needed (swelling in the legs )., Disp: 90 tablet, Rfl:  2 .  hydroxyurea (HYDREA) 500 MG capsule, Take 500 mg by mouth every other day. May take with food to minimize GI side effects., Disp: , Rfl:  .  LORazepam (ATIVAN) 0.5 MG tablet, Take 1 tablet by mouth every morning and 2 tablets by mouth at bedtime if needed, Disp: 90 tablet, Rfl: 2 .  metoprolol (LOPRESSOR) 50 MG tablet, Take 1 tablet (50 mg total) by mouth 2 (two) times daily., Disp: 180 tablet, Rfl: 1 .  omeprazole (PRILOSEC) 20 MG capsule, Take 20 mg by mouth daily., Disp: , Rfl:  .  simethicone (MYLICON) 0000000 MG chewable tablet, Chew 125 mg by mouth every 6 (six) hours as needed for flatulence. Reported on 07/25/2015, Disp: , Rfl:  .  vitamin E 400 UNIT capsule, Take 800 Units by mouth daily. , Disp: , Rfl:   Allergies:  Allergies  Allergen Reactions  . Penicillins Other (See Comments), Rash and Hives    Other Reaction: OTHER REACTION Other Reaction: OTHER REACTION Mouth blisters 03-29-11  . Atorvastatin Diarrhea    REACTION: diarrhe  . Ezetimibe     REACTION: nausea  . Fluvastatin Sodium Other (See Comments)    "Can't remember"  . Magnesium Hydroxide Other (See Comments)    REACTION: triggers HAs  . Meloxicam Other (See Comments)    Pt seeing auro's / spots.    . Omeprazole Magnesium  auras  . Pneumovax [Pneumococcal Polysaccharide Vaccine]     Local reaction  . Quinapril Hcl Other (See Comments)    03-29-11 "feelings of tiredness"  . Simvastatin Diarrhea  . Topamax [Topiramate] Itching  . Valsartan Itching and Other (See Comments)    REACTION: toes itch  . Vit D-Vit E-Safflower Oil Other (See Comments)    Headaches  . Carvedilol     "teeth hurt when I took it"    Past Medical History, Surgical history, Social history, and Family History were reviewed and updated.  Review of Systems: As above  Physical Exam:  weight is 170 lb 12.8 oz (77.5 kg). Her oral temperature is 97.8 F (36.6 C). Her blood pressure is 139/77 and her pulse is 64. Her respiration is 20 and  oxygen saturation is 98%.   Well-developed and well-nourished white female. Head and neck exam shows no ocular or oral lesions.She does have a healing vesicular macular-type lesion just left of the midline between the lip and nares. She has no palpable cervical or supraclavicular lymph nodes. Lungs are clear bilaterally. Cardiac exam regular rate and rhythm with a normal S1 and S2. There are no murmurs rubs or bruits. Breast exam shows right breast with no masses or edema or erythema. There is no right axillary adenopathy. Left breast shows well-healed lumpectomy at the 2:00 position. She has no t obvious mass in the left breast. There is some slight contraction from radiation. She does not have any obvious left axillary adenopathy. Abdomen is soft. Has good bowel sounds. There is no palpable abdominal mass. There is no palpable liver or spleen tip. Neck exam shows no kyphosis. There is no tenderness over the spine ribs or hips. Extremities shows no lymphedema. She has no joint swelling. She's good range most of her joints. Skin exam no rashes. Neurological exam shows no focal neurological deficits.  Lab Results  Component Value Date   WBC 5.6 04/10/2016   HGB 12.1 04/10/2016   HCT 39.1 04/10/2016   MCV 76 (L) 04/10/2016   PLT 395 04/10/2016     Chemistry      Component Value Date/Time   NA 138 02/10/2016 1324   K 4.4 02/10/2016 1324   CL 99 10/19/2015 1108   CL 101 06/25/2013 1356   CO2 26 02/10/2016 1324   BUN 9.4 02/10/2016 1324   CREATININE 0.8 02/10/2016 1324      Component Value Date/Time   CALCIUM 9.4 02/10/2016 1324   ALKPHOS 80 02/10/2016 1324   AST 24 02/10/2016 1324   ALT 12 02/10/2016 1324   BILITOT 1.25 (H) 02/10/2016 1324         Impression and Plan: [name] is a 82 year old white female with 2 separate problems. She has been doing very well with respect to the polycythemia.  For now, as her problem is that she had shingles that was localized to the upper lip  area just left of the midline. This appears to be healing. I think she by has some neuropathic pain.  I will go ahead and give her some Zostrix cream. This will help. She's had this now for about 3 weeks so I don't think given her an anti-viral agent would make a difference.   I reassure her that this was not from her polycythemia as her blood counts are well controlled and her platelet count is better.   We will go ahead and get her back now in 3 or 4 months. I think this would be  reasonable. She was given sooner if she has any other issues.     Volanda Napoleon, MD 11/28/201710:06 AM

## 2016-04-27 ENCOUNTER — Other Ambulatory Visit: Payer: Self-pay | Admitting: Hematology & Oncology

## 2016-05-08 NOTE — Progress Notes (Addendum)
Subjective:   [name] is a 82 y.o. female who presents for Medicare Annual (Subsequent) preventive examination.  Review of Systems:  No ROS.  Medicare Wellness Visit.  Cardiac Risk Factors include: advanced age (>54men, >3 women);dyslipidemia;hypertension Sleep patterns: Sleeps about 6 hrs per night. Wakes once each night to urinate. Feels rested. Home Safety/Smoke Alarms:  Smoke detectors in place.  Living environment; residence and Firearm Safety: Lives alone with 2 dogs in 2 story home. No guns. Feels safe.   Seat Belt Safety/Bike Helmet: Wears seatbelt.   Counseling:   Eye Exam- Wears glasses. Follows with [facility] annually. Dental- Dr.Braddy every 6 months at least.   Female:   Pap- Last 3 yrs ago-normal per pt. Mammo- Last on 11/21/15: BI-RADS CATEGORY  2: Benign. Report recommends 1 yr f/u.    Dexa scan-  Last on 02/23/13: osteoporosis. Recommend Fosamax 70 mg weekly or Actonel monthly and In addition calcium and vitamin D supplements daily per Dr.Paz. CCS- last 07/14/08: Mild diverticulosis. No f/u recommended on file.     Objective:     Vitals: BP 130/72 (BP Location: Right Arm, Patient Position: Sitting, Cuff Size: Normal)   Pulse 85   Ht 5\' 6"  (1.676 m)   Wt 168 lb 6.4 oz (76.4 kg)   SpO2 97%   BMI 27.18 kg/m   Body mass index is 27.18 kg/m.   Tobacco History  Smoking Status  . Former Smoker  . Packs/day: 1.00  . Years: 37.00  . Types: Cigarettes  . Start date: 06/26/1949  . Quit date: 05/14/1988  Smokeless Tobacco  . Never Used    Comment: quit 25 years ago     Counseling given: No   Past Medical History:  Diagnosis Date  . CAD (coronary artery disease)    mild nonobstructive disease on cath in 2003  . Cancer ([place])   . Cardiomyopathy    Probable Takotsubo, severe CP w/ normal cath in 1994. Severe CP in 2003 w/ widespread T wave inversions on ECG. Cath w/ minimal coronary disease but LV-gram showed periapical severe hypokinesis and  basilar hyperkinesia (EF 40%). Last echo in 4/09 confirmed full LV functional recovery with EF 60%, no regional wall motion abnormalities, mild to moderate LVH.  Marland Kitchen CVA (cerebral infarction)   . Depression with anxiety 03-29-11   lost husband 3'09  . Diverticulitis   . DVT (deep venous thrombosis) ([place])    after venous ablation, R leg  . E. coli gastroenteritis 03-29-11   8'10  . Fracture 09/10/07   L2, status post vertebroplasty of L2 performed by IR  . GERD (gastroesophageal reflux disease)   . Headache(784.0)    migraines  . Hemorrhoids   . Hiatal hernia 03-29-11   no nerve problems  . Hyperlipidemia   . Hyperplastic colon polyp 06/2001  . Hypertension   . OA (osteoarthritis) of knee 03-29-11   w/ bilateral knee pain-not a problem now  . OSA (obstructive sleep apnea) 03-29-11   no cpap used, not a problem now.  . Osteoporosis   . Otalgia of both ears    Dr. Simeon Craft  . Polycythemia   . Stroke [facility]) 03-29-11   CVA x2 -last 10'12-?TIA(visual problems)  . Varicose vein    Past Surgical History:  Procedure Laterality Date  . APPENDECTOMY    . BACK SURGERY    . BREAST BIOPSY    . BREAST CYST EXCISION    . BREAST LUMPECTOMY     left stage I  left breast cancer  . CHOLECYSTECTOMY  04/09/2011   Procedure: LAPAROSCOPIC CHOLECYSTECTOMY WITH INTRAOPERATIVE CHOLANGIOGRAM;  Surgeon: Odis Hollingshead, MD;  Location: [facility] ORS;  Service: General;  Laterality: N/A;  . Dental Extraction     L maxillary molar  . KYPHOSIS SURGERY  08/2007   cement used  . OVARIAN CYST SURGERY     left  . TONSILLECTOMY    . TOTAL KNEE ARTHROPLASTY  06/2010   left  . TUBAL LIGATION     Family History  Problem Relation Age of Onset  . Breast cancer Mother   . Heart disease Father     MIs  . Colon cancer Other     cousin  . Breast cancer Sister   . Leukemia Brother     GM   History  Sexual Activity  . Sexual activity: No    Outpatient Encounter Prescriptions as of 05/10/2016  Medication Sig  .  acetaminophen-codeine (TYLENOL #3) 300-30 MG per tablet Take 1 tablet by mouth every 8 (eight) hours as needed. For migraine  . aspirin 81 MG tablet Take 81 mg by mouth daily.  . capsicum (ZOSTRIX) 0.075 % topical cream Apply 1 application topically 2 (two) times daily.  . hydrochlorothiazide (HYDRODIURIL) 25 MG tablet Take 1 tablet (25 mg total) by mouth daily as needed (swelling in the legs ).  . hydroxyurea (HYDREA) 500 MG capsule Take 500 mg by mouth every other day. May take with food to minimize GI side effects.  Marland Kitchen LORazepam (ATIVAN) 0.5 MG tablet Take 1 tablet by mouth every morning and 2 tablets by mouth at bedtime if needed  . metoprolol (LOPRESSOR) 50 MG tablet Take 1 tablet (50 mg total) by mouth 2 (two) times daily.  Marland Kitchen omeprazole (PRILOSEC) 20 MG capsule Take 20 mg by mouth daily.  . hydroxyurea (HYDREA) 500 MG capsule TAKE 1 CAPSULE BY MOUTH EVERY OTHER DAY TAKE WITH FOOD TO MINIMIZE SIDE EFFECT  . simethicone (MYLICON) 0000000 MG chewable tablet Chew 125 mg by mouth every 6 (six) hours as needed for flatulence. Reported on 07/25/2015  . [DISCONTINUED] Cholecalciferol (VITAMIN D) 2000 UNITS tablet Take 2,000 Units by mouth daily.  . [DISCONTINUED] vitamin E 400 UNIT capsule Take 800 Units by mouth daily.    No facility-administered encounter medications on file as of 05/10/2016.     Activities of Daily Living In your present state of health, do you have any difficulty performing the following activities: 05/10/2016  Hearing? N  Vision? N  Difficulty concentrating or making decisions? N  Walking or climbing stairs? N  Dressing or bathing? N  Doing errands, shopping? N  Preparing Food and eating ? N  Using the Toilet? N  In the past six months, have you accidently leaked urine? Y  Do you have problems with loss of bowel control? N  Managing your Medications? N  Managing your Finances? N  Housekeeping or managing your Housekeeping? N  Some recent data might be hidden    Patient  Care Team: Colon Branch, MD as PCP - [place], NP as Nurse Practitioner (Nurse Practitioner) Volanda Napoleon, MD as Consulting Physician (Oncology)    Assessment:     Physical assessment deferred to PCP.  Exercise Activities and Dietary recommendations Current Exercise Habits: Structured exercise class, Time (Minutes): 60, Frequency (Times/Week): 7, Weekly Exercise (Minutes/Week): 420, Intensity: Mild   Diet (meal preparation, eat out, water intake, caffeinated beverages, dairy products, fruits and vegetables): in general, a "healthy" diet  ,  well balanced Breakfast: [place] or cereal with coffee. Lunch: Small frozen pizza or [place] sandwich. Dinner: occasional Drinks about 8 glasses of water per day.     Goals      Patient Stated   . Increase socialization (pt-stated)          Join card club       Other   . Maintain healthy lifestyle      Fall Risk Fall Risk  05/10/2016 02/10/2016 01/09/2016 12/12/2015 11/11/2015  Falls in the past year? No Yes No No No  Number falls in past yr: - 1 - - -  Injury with Fall? - No - - -  Risk Factor Category  - - - - -  Risk for fall due to : - - - - -  Risk for fall due to (comments): - - - - -   Depression Screen PHQ 2/9 Scores 05/10/2016 05/04/2015 02/15/2015 11/12/2014  PHQ - 2 Score 0 0 0 0     Cognitive Function MMSE - Mini Mental State Exam 05/10/2016 05/04/2015  Orientation to time 5 5  Orientation to Place 5 5  Registration 3 3  Attention/ Calculation 5 5  Recall 3 3  Language- name 2 objects 2 2  Language- repeat 1 1  Language- follow 3 step command 3 3  Language- read & follow direction 1 1  Write a sentence 1 1  Copy design 1 1  Total score 30 30        Immunization History  Administered Date(s) Administered  . Influenza Split 03/21/2011  . Influenza, High Dose Seasonal PF 02/15/2015  . Influenza,inj,Quad PF,36+ Mos 03/19/2014, 02/10/2016  . Pneumococcal Conjugate-13 03/19/2014  . Pneumococcal  Polysaccharide-23 08/18/2012  . Td 10/12/2005   Screening Tests Health Maintenance  Topic Date Due  . TETANUS/TDAP  10/13/2015  . ZOSTAVAX  05/10/2017 (Originally 08/05/1991)  . INFLUENZA VACCINE  Completed  . DEXA SCAN  Completed  . PNA vac Low Risk Adult  Completed      Plan:     Continue to eat heart healthy diet (full of fruits, vegetables, whole grains, lean protein, water--limit salt, fat, and sugar intake) and increase physical activity as tolerated. Bring a copy of your advance directives to your next office visit.   During the course of the visit the patient was educated and counseled about the following appropriate screening and preventive services:   Vaccines to include Pneumoccal, Influenza, Hepatitis B, Td, Zostavax, HCV  Cardiovascular Disease  Colorectal cancer screening  Bone density screening  Diabetes screening  Glaucoma screening  Mammography/PAP  Nutrition counseling   Patient Instructions (the written plan) was given to the patient.   Shela Nevin, RN  05/10/2016  Reviewed-- Samara Deist- Cheri Rous, DO   05/22/2016

## 2016-05-08 NOTE — Progress Notes (Signed)
Pre visit review using our clinic review tool, if applicable. No additional management support is needed unless otherwise documented below in the visit note. 

## 2016-05-10 ENCOUNTER — Ambulatory Visit: Payer: Medicare Other | Admitting: *Deleted

## 2016-05-10 ENCOUNTER — Ambulatory Visit (INDEPENDENT_AMBULATORY_CARE_PROVIDER_SITE_OTHER): Payer: Medicare Other | Admitting: *Deleted

## 2016-05-10 ENCOUNTER — Encounter: Payer: Self-pay | Admitting: *Deleted

## 2016-05-10 VITALS — BP 130/72 | HR 85 | Ht 66.0 in | Wt 168.4 lb

## 2016-05-10 DIAGNOSIS — Z0000 Encounter for general adult medical examination without abnormal findings: Secondary | ICD-10-CM

## 2016-05-10 NOTE — Patient Instructions (Signed)
Continue to eat heart healthy diet (full of fruits, vegetables, whole grains, lean protein, water--limit salt, fat, and sugar intake) and increase physical activity as tolerated. Bring a copy of your advance directives to your next office visit.

## 2016-05-18 ENCOUNTER — Ambulatory Visit: Payer: Medicare Other | Admitting: Hematology & Oncology

## 2016-05-18 ENCOUNTER — Other Ambulatory Visit: Payer: Medicare Other

## 2016-05-31 ENCOUNTER — Other Ambulatory Visit: Payer: Self-pay | Admitting: Internal Medicine

## 2016-06-01 NOTE — Telephone Encounter (Signed)
Rx printed, awaiting MD signature.  

## 2016-06-01 NOTE — Telephone Encounter (Signed)
Rx faxed to [facility] pharmacy.  

## 2016-06-01 NOTE — Telephone Encounter (Signed)
Pt is requesting refill on Lorazepam.  Last OV: 09/27/2015 Last Fill: 01/17/2016 #90 and 2RF (Pt sig: 1 tab in AM and 2 tabs in PM prn) UDS: 06/01/2015 Low risk  Please advise.

## 2016-06-01 NOTE — Telephone Encounter (Signed)
Okay 90, no refills, due for a checkup. Please arrange

## 2016-06-06 ENCOUNTER — Other Ambulatory Visit: Payer: Self-pay | Admitting: Hematology & Oncology

## 2016-06-06 DIAGNOSIS — Z853 Personal history of malignant neoplasm of breast: Secondary | ICD-10-CM

## 2016-07-02 ENCOUNTER — Other Ambulatory Visit: Payer: Self-pay

## 2016-07-05 ENCOUNTER — Other Ambulatory Visit (HOSPITAL_BASED_OUTPATIENT_CLINIC_OR_DEPARTMENT_OTHER): Payer: Medicare Other

## 2016-07-05 ENCOUNTER — Ambulatory Visit (HOSPITAL_BASED_OUTPATIENT_CLINIC_OR_DEPARTMENT_OTHER): Payer: Medicare Other | Admitting: Family

## 2016-07-05 ENCOUNTER — Telehealth: Payer: Self-pay | Admitting: Internal Medicine

## 2016-07-05 VITALS — BP 143/69 | HR 78 | Temp 98.0°F | Wt 167.1 lb

## 2016-07-05 DIAGNOSIS — C50011 Malignant neoplasm of nipple and areola, right female breast: Secondary | ICD-10-CM

## 2016-07-05 DIAGNOSIS — B0223 Postherpetic polyneuropathy: Secondary | ICD-10-CM | POA: Diagnosis not present

## 2016-07-05 DIAGNOSIS — Z853 Personal history of malignant neoplasm of breast: Secondary | ICD-10-CM

## 2016-07-05 DIAGNOSIS — D45 Polycythemia vera: Secondary | ICD-10-CM

## 2016-07-05 DIAGNOSIS — D751 Secondary polycythemia: Secondary | ICD-10-CM

## 2016-07-05 DIAGNOSIS — Z170 Estrogen receptor positive status [ER+]: Secondary | ICD-10-CM

## 2016-07-05 DIAGNOSIS — R601 Generalized edema: Secondary | ICD-10-CM

## 2016-07-05 LAB — COMPREHENSIVE METABOLIC PANEL (CC13)
ALT: 8 IU/L (ref 0–32)
AST (SGOT): 17 IU/L (ref 0–40)
Albumin, Serum: 4.2 g/dL (ref 3.5–4.7)
Albumin/Globulin Ratio: 1.6 (ref 1.2–2.2)
Alkaline Phosphatase, S: 88 IU/L (ref 39–117)
BUN/Creatinine Ratio: 8 — ABNORMAL LOW (ref 12–28)
BUN: 7 mg/dL — ABNORMAL LOW (ref 8–27)
Bilirubin Total: 0.9 mg/dL (ref 0.0–1.2)
Calcium, Ser: 9.7 mg/dL (ref 8.7–10.3)
Carbon Dioxide, Total: 29 mmol/L (ref 18–29)
Chloride, Ser: 104 mmol/L (ref 96–106)
Creatinine, Ser: 0.91 mg/dL (ref 0.57–1.00)
GFR calc Af Amer: 67 (ref >59)
GFR calc non Af Amer: 58 — ABNORMAL LOW (ref >59)
Globulin, Total: 2.6 (ref 1.5–4.5)
Glucose: 106 mg/dL — ABNORMAL HIGH (ref 65–99)
Potassium, Ser: 4.1 mmol/L (ref 3.5–5.2)
Sodium: 140 mmol/L (ref 134–144)
Total Protein: 6.8 g/dL (ref 6.0–8.5)

## 2016-07-05 LAB — CBC WITH DIFFERENTIAL (CANCER CENTER ONLY)
BASO#: 0.1 10*3/uL (ref 0.0–0.2)
BASO%: 0.8 % (ref 0.0–2.0)
EOS%: 2.8 % (ref 0.0–7.0)
Eosinophils Absolute: 0.2 10*3/uL (ref 0.0–0.5)
HCT: 42.8 % (ref 34.8–46.6)
HGB: 12.9 g/dL (ref 11.6–15.9)
LYMPH#: 1.1 10*3/uL (ref 0.9–3.3)
LYMPH%: 15.7 % (ref 14.0–48.0)
MCH: 23.3 pg — ABNORMAL LOW (ref 26.0–34.0)
MCHC: 30.1 g/dL — ABNORMAL LOW (ref 32.0–36.0)
MCV: 77 fL — ABNORMAL LOW (ref 81–101)
MONO#: 0.6 10*3/uL (ref 0.1–0.9)
MONO%: 8 % (ref 0.0–13.0)
NEUT#: 5.3 10*3/uL (ref 1.5–6.5)
NEUT%: 72.7 % (ref 39.6–80.0)
Platelets: 402 10*3/uL — ABNORMAL HIGH (ref 145–400)
RBC: 5.53 10*6/uL — ABNORMAL HIGH (ref 3.70–5.32)
RDW: 17.3 % — ABNORMAL HIGH (ref 11.1–15.7)
WBC: 7.3 10*3/uL (ref 3.9–10.0)

## 2016-07-05 MED ORDER — HYDROCHLOROTHIAZIDE 25 MG PO TABS: 25.0000 mg | ORAL_TABLET | Freq: Every day | ORAL | 0 refills | Status: DC | PRN

## 2016-07-05 MED ORDER — ACETAMINOPHEN-CODEINE #3 300-30 MG PO TABS: 1.0000 | ORAL_TABLET | Freq: Three times a day (TID) | ORAL | 0 refills | Status: DC | PRN

## 2016-07-05 NOTE — Telephone Encounter (Signed)
Pt is requesting refill on Tylenol #3.  Last OV: 05/10/2016 for AWV w/ Glenard Haring Last Fill: 07/28/2013 #120 and 0RF  Please advise?

## 2016-07-05 NOTE — Telephone Encounter (Signed)
Please inform Pt that Rx's have been placed at front desk for pick up at her convenience.

## 2016-07-05 NOTE — Telephone Encounter (Signed)
°  Relation to [id number] Call back number: [phone] Pharmacy:  Reason for call: pt states she is needing rx acetaminophen-codeine (TYLENOL #3) 300-30 MG per tablet  And hydrochlorothiazide (HYDRODIURIL) 25 MG tablet, pt not sure what pharmacy she wants to use so she perfer to pick up printed copy, please call when available for pick up

## 2016-07-05 NOTE — Progress Notes (Signed)
Hematology and Oncology Follow Up Visit  [name] [id number] [birth year] 83 y.o. 07/05/2016   Principle Diagnosis:  Polycythemia vera - JAK2 positive Stage I ([id number] ) infiltrating duct carcinoma the left breast - diagnosed May 2013  Current Therapy:   1. Phlebotomy to maintain hematocrit below 45% 2. Hydrea 500 mg by mouth every other day 3. Femara 2.5 mg by mouth daily - finished 4 years in 08/2015    Interim History:  [name] is here today for a follow-up. She is feeling fatigued and her lip is still tender. She had a spot biopsied and states this was negative. The biopsy site has healed nicely. We will have her try some abreva and see if this helps.  She verbalized that she is taking her Hydrea 500 mg PO every other day as prescribed. Her platelet count is stable at 402 today. Hct is 42.8 so she will not need a phlebotomy today.  She has been chronically iron deficient due to phlebotomies. This could certainly cause her to feel fatigued.  She is still staying active, despite the fatigue, walking her dogs and doing water aerobics 3 times a week.   She has had no problem with frequent infections. No fever, chills, n/v, cough, dizziness, chest pain, palpitations, abdominal pain or changes in bowel or bladder habits.  No changes with her chest. No lymphadenopathy found on exam.  No swelling or tenderness in her extremities. She wears compression socks as needed for support. She has maintained a good appetite and is staying well hydrated. Her weight is stable.   Medications:  Allergies as of 07/05/2016      Reactions   Penicillins Other (See Comments), Rash, Hives   Other Reaction: OTHER REACTION Other Reaction: OTHER REACTION Mouth blisters 03-29-11   Atorvastatin Diarrhea   REACTION: diarrhe   Ezetimibe    REACTION: nausea   Fluvastatin Sodium Other (See Comments)   "Can't remember"   Magnesium Hydroxide Other (See Comments)   REACTION: triggers HAs   Meloxicam  Other (See Comments)   Pt seeing auro's / spots.     Omeprazole Magnesium    auras   Pneumovax [pneumococcal Polysaccharide Vaccine]    Local reaction   Quinapril Hcl Other (See Comments)   03-29-11 "feelings of tiredness"   Simvastatin Diarrhea   Topamax [topiramate] Itching   Valsartan Itching, Other (See Comments)   REACTION: toes itch   Vit D-vit E-safflower Oil Other (See Comments)   Headaches   Carvedilol    "teeth hurt when I took it"      Medication List       Accurate as of 07/05/16  2:39 PM. Always use your most recent med list.          acetaminophen-codeine 300-30 MG tablet Commonly known as:  TYLENOL #3 Take 1 tablet by mouth every 8 (eight) hours as needed. For migraine   aspirin 81 MG tablet Take 81 mg by mouth daily.   capsicum 0.075 % topical cream Commonly known as:  ZOSTRIX Apply 1 application topically 2 (two) times daily.   hydrochlorothiazide 25 MG tablet Commonly known as:  HYDRODIURIL Take 1 tablet (25 mg total) by mouth daily as needed (swelling in the legs ).   hydroxyurea 500 MG capsule Commonly known as:  HYDREA Take 500 mg by mouth every other day. May take with food to minimize GI side effects.   LORazepam 0.5 MG tablet Commonly known as:  ATIVAN Take 1 tablet by mouth every morning  and 2 tablets by mouth at bedtime as needed   metoprolol 50 MG tablet Commonly known as:  LOPRESSOR Take 1 tablet (50 mg total) by mouth 2 (two) times daily.   omeprazole 20 MG capsule Commonly known as:  PRILOSEC Take 20 mg by mouth daily.   simethicone 125 MG chewable tablet Commonly known as:  MYLICON Chew 0000000 mg by mouth every 6 (six) hours as needed for flatulence. Reported on 07/25/2015       Allergies:  Allergies  Allergen Reactions  . Penicillins Other (See Comments), Rash and Hives    Other Reaction: OTHER REACTION Other Reaction: OTHER REACTION Mouth blisters 03-29-11  . Atorvastatin Diarrhea    REACTION: diarrhe  . Ezetimibe       REACTION: nausea  . Fluvastatin Sodium Other (See Comments)    "Can't remember"  . Magnesium Hydroxide Other (See Comments)    REACTION: triggers HAs  . Meloxicam Other (See Comments)    Pt seeing auro's / spots.    . Omeprazole Magnesium     auras  . Pneumovax [Pneumococcal Polysaccharide Vaccine]     Local reaction  . Quinapril Hcl Other (See Comments)    03-29-11 "feelings of tiredness"  . Simvastatin Diarrhea  . Topamax [Topiramate] Itching  . Valsartan Itching and Other (See Comments)    REACTION: toes itch  . Vit D-Vit E-Safflower Oil Other (See Comments)    Headaches  . Carvedilol     "teeth hurt when I took it"    Past Medical History, Surgical history, Social history, and Family History were reviewed and updated.  Review of Systems: All other 10 point review of systems is negative.   Physical Exam:  weight is 167 lb 1.9 oz (75.8 kg). Her oral temperature is 98 F (36.7 C). Her blood pressure is 143/69 (abnormal) and her pulse is 78. Her oxygen saturation is 96%.   Wt Readings from Last 3 Encounters:  07/05/16 167 lb 1.9 oz (75.8 kg)  05/10/16 168 lb 6.4 oz (76.4 kg)  04/10/16 170 lb 12.8 oz (77.5 kg)    Ocular: Sclerae unicteric, pupils equal, round and reactive to light Ear-nose-throat: Oropharynx clear, dentition fair Lymphatic: No cervical supraclavicular or axillary adenopathy Lungs no rales or rhonchi, good excursion bilaterally Heart regular rate and rhythm, no murmur appreciated Abd soft, nontender, positive bowel sounds MSK no focal spinal tenderness, no joint edema Neuro: non-focal, well-oriented, appropriate affect Breasts: Surgical changes from left breast lumpectomy. No changes to right breast. No mass, lesion, rash or lymphadenopathy found on exam.   Lab Results  Component Value Date   WBC 7.3 07/05/2016   HGB 12.9 07/05/2016   HCT 42.8 07/05/2016   MCV 77 (L) 07/05/2016   PLT 402 (H) 07/05/2016   Lab Results  Component Value Date    FERRITIN 33 04/10/2016   IRON 22 (L) 04/10/2016   TIBC 430 04/10/2016   UIBC 409 (H) 04/10/2016   IRONPCTSAT 5 (L) 04/10/2016   Lab Results  Component Value Date   RETICCTPCT 0.8 02/02/2015   RBC 5.53 (H) 07/05/2016   RETICCTABS 41.4 02/02/2015   No results found for: KPAFRELGTCHN, LAMBDASER, KAPLAMBRATIO No results found for: IGGSERUM, IGA, IGMSERUM No results found for: TOTALPROTELP, ALBUMINELP, A1GS, A2GS, BETS, BETA2SER, GAMS, MSPIKE, SPEI   Chemistry      Component Value Date/Time   NA 139 04/10/2016 0855   K 4.2 04/10/2016 0855   CL 99 10/19/2015 1108   CL 101 06/25/2013 1356   CO2  29 04/10/2016 0855   BUN 10.0 04/10/2016 0855   CREATININE 0.8 04/10/2016 0855      Component Value Date/Time   CALCIUM 9.5 04/10/2016 0855   ALKPHOS 85 04/10/2016 0855   AST 19 04/10/2016 0855   ALT 13 04/10/2016 0855   BILITOT 1.26 (H) 04/10/2016 0855     Impression and Plan: Ms. Saldate is a 83 year old white female with polycythemia and history of stage 1 infiltrating ductal carcinoma of the left breast with lumpectomy in 2013. She is doing well and completed 4 years of Femara in April 2017. So far, there has been no evidence of recurrence.  She has tolerated Hydrea nicely and platelet count is stable at 401 and WBC count is 7.3.  Hct today is 42.8 so she will not need a phlebotomy with this visit.  She is requesting refills for her Tylenol #3 and Hydrochlorothiazide and I explained that that was with her PCP and she will have to follow-up with Dr. Larose Kells. She states that she will stop at their office downstairs on her way out today.  We will plan to see her back in 3 months for repeat labs and follow-up.  She will contact our office with any questions or concerns. We can certainly see her sooner if need be.    Eliezer Bottom, NP 2/22/20182:39 PM

## 2016-07-05 NOTE — Telephone Encounter (Signed)
Rx's printed, awaiting MD signature.  

## 2016-07-05 NOTE — Telephone Encounter (Signed)
Okay #60, no refills. Needs office visit for routine check up

## 2016-07-10 NOTE — Telephone Encounter (Signed)
Pt does not have vm set up on their phone, could not leave a message

## 2016-07-12 ENCOUNTER — Telehealth: Payer: Self-pay | Admitting: Internal Medicine

## 2016-07-12 MED ORDER — LORAZEPAM 0.5 MG PO TABS: ORAL_TABLET | ORAL | 1 refills | Status: DC

## 2016-07-12 NOTE — Telephone Encounter (Signed)
Okay #90 and 1 refill 

## 2016-07-12 NOTE — Telephone Encounter (Signed)
Rx faxed to [facility] pharmacy.  

## 2016-07-12 NOTE — Telephone Encounter (Signed)
Rx printed, awaiting MD signature.  

## 2016-07-12 NOTE — Telephone Encounter (Signed)
Caller name: Kiane  Relation to pt: self Call back number: [phone] Pharmacy:[facility], [address]  Reason for call: Pt came in office stating is needing a refill on LORazepam (ATIVAN) 0.5 MG tablet (last 06-01-2016). Please advise.

## 2016-07-12 NOTE — Telephone Encounter (Signed)
Pt is requesting refill on Lorazepam.  Last OV: 05/10/2016 w/ Angel for AWV Last Fill: 06/01/2016 #90 and 0RF (Pt sig: 1 tab in AM and 2 in PM PRN) UDS: 06/01/2015 Low risk  Please advise.

## 2016-07-17 ENCOUNTER — Encounter: Payer: Self-pay | Admitting: Internal Medicine

## 2016-07-17 ENCOUNTER — Ambulatory Visit (INDEPENDENT_AMBULATORY_CARE_PROVIDER_SITE_OTHER): Payer: Medicare Other | Admitting: Internal Medicine

## 2016-07-17 VITALS — BP 124/72 | HR 70 | Temp 98.0°F | Resp 14 | Ht 66.0 in | Wt 167.4 lb

## 2016-07-17 DIAGNOSIS — M150 Primary generalized (osteo)arthritis: Secondary | ICD-10-CM

## 2016-07-17 DIAGNOSIS — N390 Urinary tract infection, site not specified: Secondary | ICD-10-CM

## 2016-07-17 DIAGNOSIS — I10 Essential (primary) hypertension: Secondary | ICD-10-CM

## 2016-07-17 DIAGNOSIS — F341 Dysthymic disorder: Secondary | ICD-10-CM | POA: Diagnosis not present

## 2016-07-17 DIAGNOSIS — M159 Polyosteoarthritis, unspecified: Secondary | ICD-10-CM

## 2016-07-17 DIAGNOSIS — M8949 Other hypertrophic osteoarthropathy, multiple sites: Secondary | ICD-10-CM

## 2016-07-17 NOTE — Progress Notes (Signed)
Pre visit review using our clinic review tool, if applicable. No additional management support is needed unless otherwise documented below in the visit note. 

## 2016-07-17 NOTE — Progress Notes (Signed)
Subjective:    Patient ID: [name], female    DOB: [birth year], 83 y.o.   MRN: [id number]  DOS:  07/17/2016 Type of visit - description : last OV 09-2015, has 2 concerns Interval history:  Had a skin lesion/rash at the left upper lip, had a biopsy by dermatology more than 2 months ago. Patient is somewhat confused because initially she was told the biopsy was negative but later on she was called and asked to go back for further treatment, apparently for freezing therapy. Also, few weeks history of urinary frequency but denies dysuria, gross hematuria. She took OTC AZO and is not helping much.   Review of Systems Denies fever chills No nausea or vomiting No abdominal pain or flank pain. No vaginal rash, discharge or bleeding.   Past Medical History:  Diagnosis Date  . CAD (coronary artery disease)    mild nonobstructive disease on cath in 2003  . Cancer ([place])   . Cardiomyopathy    Probable Takotsubo, severe CP w/ normal cath in 1994. Severe CP in 2003 w/ widespread T wave inversions on ECG. Cath w/ minimal coronary disease but LV-gram showed periapical severe hypokinesis and basilar hyperkinesia (EF 40%). Last echo in 4/09 confirmed full LV functional recovery with EF 60%, no regional wall motion abnormalities, mild to moderate LVH.  Marland Kitchen CVA (cerebral infarction)   . Depression with anxiety 03-29-11   lost husband 3'09  . Diverticulitis   . DVT (deep venous thrombosis) ([place])    after venous ablation, R leg  . E. coli gastroenteritis 03-29-11   8'10  . Fracture 09/10/07   L2, status post vertebroplasty of L2 performed by IR  . GERD (gastroesophageal reflux disease)   . Headache(784.0)    migraines  . Hemorrhoids   . Hiatal hernia 03-29-11   no nerve problems  . Hyperlipidemia   . Hyperplastic colon polyp 06/2001  . Hypertension   . OA (osteoarthritis) of knee 03-29-11   w/ bilateral knee pain-not a problem now  . OSA (obstructive sleep apnea) 03-29-11   no cpap used,  not a problem now.  . Osteoporosis   . Otalgia of both ears    Dr. Simeon Craft  . Polycythemia   . Stroke [facility]) 03-29-11   CVA x2 -last 10'12-?TIA(visual problems)  . Varicose vein     Past Surgical History:  Procedure Laterality Date  . APPENDECTOMY    . BACK SURGERY    . BREAST BIOPSY    . BREAST CYST EXCISION    . BREAST LUMPECTOMY     left stage I left breast cancer  . CHOLECYSTECTOMY  04/09/2011   Procedure: LAPAROSCOPIC CHOLECYSTECTOMY WITH INTRAOPERATIVE CHOLANGIOGRAM;  Surgeon: Odis Hollingshead, MD;  Location: [facility];  Service: General;  Laterality: N/A;  . Dental Extraction     L maxillary molar  . KYPHOSIS SURGERY  08/2007   cement used  . OVARIAN CYST SURGERY     left  . TONSILLECTOMY    . TOTAL KNEE ARTHROPLASTY  06/2010   left  . TUBAL LIGATION      Social History   Social History  . Marital status: Widowed    Spouse name: N/A  . Number of children: 3  . Years of education: N/A   Occupational History  . n/a    Social History Main Topics  . Smoking status: Former Smoker    Packs/day: 1.00    Years: 37.00    Types: Cigarettes    Start date:  06/26/1951    Quit date: 05/14/1988  . Smokeless tobacco: Never Used     Comment: quit 25 years ago  . Alcohol use 0.6 oz/week    1 Cans of beer per week     Comment: occasional/social  . Drug use: No  . Sexual activity: No   Other Topics Concern  . Not on file   Social History Narrative   Lost husband 07/29/07-    Lives in [place] in a town house by herself,daughter moved out ~ 03-2015, has 2 daughters in town   Still drives                   Allergies as of 07/17/2016      Reactions   Penicillins Other (See Comments), Rash, Hives   Other Reaction: OTHER REACTION Other Reaction: OTHER REACTION Mouth blisters 03-29-11   Atorvastatin Diarrhea   REACTION: diarrhe   Ezetimibe    REACTION: nausea   Fluvastatin Sodium Other (See Comments)   "Can't remember"   Magnesium Hydroxide Other (See  Comments)   REACTION: triggers HAs   Meloxicam Other (See Comments)   Pt seeing auro's / spots.     Omeprazole Magnesium    auras   Pneumovax [pneumococcal Polysaccharide Vaccine]    Local reaction   Quinapril Hcl Other (See Comments)   03-29-11 "feelings of tiredness"   Simvastatin Diarrhea   Topamax [topiramate] Itching   Valsartan Itching, Other (See Comments)   REACTION: toes itch   Vit D-vit E-safflower Oil Other (See Comments)   Headaches   Carvedilol    "teeth hurt when I took it"      Medication List       Accurate as of 07/17/16  9:46 PM. Always use your most recent med list.          acetaminophen-codeine 300-30 MG tablet Commonly known as:  TYLENOL #3 Take 1 tablet by mouth every 8 (eight) hours as needed. For migraine   aspirin 81 MG tablet Take 81 mg by mouth daily.   capsicum 0.075 % topical cream Commonly known as:  ZOSTRIX Apply 1 application topically 2 (two) times daily.   hydrochlorothiazide 25 MG tablet Commonly known as:  HYDRODIURIL Take 1 tablet (25 mg total) by mouth daily as needed (swelling in the legs ).   hydroxyurea 500 MG capsule Commonly known as:  HYDREA Take 500 mg by mouth every other day. May take with food to minimize GI side effects.   LORazepam 0.5 MG tablet Commonly known as:  ATIVAN Take 1 tablet by mouth every morning and 2 tablets by mouth at bedtime as needed   metoprolol 50 MG tablet Commonly known as:  LOPRESSOR Take 1 tablet (50 mg total) by mouth 2 (two) times daily.   omeprazole 20 MG capsule Commonly known as:  PRILOSEC Take 20 mg by mouth daily.   simethicone 125 MG chewable tablet Commonly known as:  MYLICON Chew 0000000 mg by mouth every 6 (six) hours as needed for flatulence. Reported on 07/25/2015          Objective:   Physical Exam  HENT:  Head:     BP 124/72 (BP Location: Left Arm, Patient Position: Sitting, Cuff Size: Normal)   Pulse 70   Temp 98 F (36.7 C) (Oral)   Resp 14   Ht 5\' 6"   (1.676 m)   Wt 167 lb 6 oz (75.9 kg)   SpO2 97%   BMI 27.02 kg/m  General:   Well developed, well nourished . NAD.  HEENT:  Normocephalic . Face symmetric, atraumatic Lungs:  CTA B Normal respiratory effort, no intercostal retractions, no accessory muscle use. Heart: RRR,  no murmur.  no pretibial edema bilaterally  Abdomen:  Not distended, soft, non-tender. No rebound or rigidity. No CVA tenderness Skin: Not pale. Not jaundice Neurologic:  alert & oriented X3.  Speech normal, gait appropriate for age and unassisted Psych--  Cognition and judgment appear intact.  Cooperative with normal attention span and concentration.  Behavior appropriate. No anxious or depressed appearing.    Assessment & Plan:   Assessment> HTN Dyslipidemia: Intolerant to most medicines including Zetia, Pravachol, Niaspan Depression, anxiety , insomnia - on ativan bid (03-2015 citalopram cause drowsiness, 04-2015 prozac diarrhea) Hem-Onc: POLYCYTEMIA-- sees hem-onc H/o breast ca  MSK: ---DJD :  pain medication as needed ---Osteoporosis:  T score -2.8 (02-2013): declined treatment 04-2015 ---L2 fractures, s/p vertebral plasty 2009 Migraine headaches GI: GERD- intolerant to prevacid, visual disturbances HH, diverticulitis, Colon polyps  CV: ---Cardiomyopathy Probable Takotsubo cardiomyopathy: Severe chest pain with normal cath in 1994.  Severe chest pain in 2003 with widespread T wave inversions on ECG.  Cath with minimal coronary disease but LV-gram showed periapical severe hypokinesis and basilar hyperkinesia (EF 40%).  Last echo in 4/09 confirmed full LV functional recovery with EF 60%, mild LVH. ---Stroke / TIA 2012 Sleep apnea 2012, , not on CPAP H/oDVT, right leg after venous ablation    PLAN Skin lesion: Status post BX, strongly recommend to follow-up with dermatologist for further advice. UTI? Has urinary frequency but otherwise ROS is negative, exam benign, check a UA, urine culture,  treat w/ abx  if appropriate  DJD, pain management: Occasional Tylenol 3, rarely. UDS and contract today Anxiety depression insomnia: On Ativan, UDS and contract today HTN: Continue metoprolol, hydrochlorothiazide, last BMP satisfactory RTC 4 months

## 2016-07-17 NOTE — Patient Instructions (Signed)
GO TO THE LAB :  Provide a urine sample      GO TO THE FRONT DESK Schedule your next appointment for a  Check up in 4 months

## 2016-07-17 NOTE — Assessment & Plan Note (Signed)
Skin lesion: Status post BX, strongly recommend to follow-up with dermatologist for further advice. UTI? Has urinary frequency but otherwise ROS is negative, exam benign, check a UA, urine culture, treat w/ abx  if appropriate  DJD, pain management: Occasional Tylenol 3, rarely. UDS and contract today Anxiety depression insomnia: On Ativan, UDS and contract today HTN: Continue metoprolol, hydrochlorothiazide, last BMP satisfactory RTC 4 months

## 2016-07-18 LAB — URINALYSIS, ROUTINE W REFLEX MICROSCOPIC
Bilirubin Urine: NEGATIVE
Hgb urine dipstick: NEGATIVE
Leukocytes, UA: NEGATIVE
Nitrite: NEGATIVE
RBC / HPF: NONE SEEN (ref 0–?)
Specific Gravity, Urine: 1.025 (ref 1.000–1.030)
Urine Glucose: NEGATIVE
Urobilinogen, UA: 0.2 (ref 0.0–1.0)
pH: 6 (ref 5.0–8.0)

## 2016-07-19 LAB — URINE CULTURE: Organism ID, Bacteria: NO GROWTH

## 2016-07-20 ENCOUNTER — Telehealth: Payer: Self-pay | Admitting: Internal Medicine

## 2016-07-20 NOTE — Telephone Encounter (Signed)
Please advise 

## 2016-07-20 NOTE — Telephone Encounter (Signed)
Spoke w/ Pt, informed of results and recommendations. Pt verbalized understanding.

## 2016-07-20 NOTE — Telephone Encounter (Signed)
Pt called in to request lab results.    [phone]

## 2016-07-20 NOTE — Telephone Encounter (Signed)
Urine showed no evidence of infection, if she continue with problems,  recommend to see gynecology

## 2016-07-26 ENCOUNTER — Emergency Department (HOSPITAL_COMMUNITY): Payer: Medicare Other

## 2016-07-26 ENCOUNTER — Emergency Department (HOSPITAL_COMMUNITY)
Admission: EM | Admit: 2016-07-26 | Discharge: 2016-07-26 | Disposition: A | Discharge status: Home / Self Care | Payer: Medicare Other | Attending: Emergency Medicine | Admitting: Emergency Medicine

## 2016-07-26 ENCOUNTER — Encounter (HOSPITAL_COMMUNITY): Payer: Self-pay | Admitting: *Deleted

## 2016-07-26 DIAGNOSIS — R079 Chest pain, unspecified: Secondary | ICD-10-CM | POA: Diagnosis not present

## 2016-07-26 DIAGNOSIS — Y939 Activity, unspecified: Secondary | ICD-10-CM | POA: Diagnosis not present

## 2016-07-26 DIAGNOSIS — M25512 Pain in left shoulder: Secondary | ICD-10-CM | POA: Insufficient documentation

## 2016-07-26 DIAGNOSIS — Z7982 Long term (current) use of aspirin: Secondary | ICD-10-CM | POA: Diagnosis not present

## 2016-07-26 DIAGNOSIS — S79912A Unspecified injury of left hip, initial encounter: Secondary | ICD-10-CM | POA: Diagnosis not present

## 2016-07-26 DIAGNOSIS — M25552 Pain in left hip: Secondary | ICD-10-CM | POA: Diagnosis not present

## 2016-07-26 DIAGNOSIS — Z853 Personal history of malignant neoplasm of breast: Secondary | ICD-10-CM | POA: Insufficient documentation

## 2016-07-26 DIAGNOSIS — S4992XA Unspecified injury of left shoulder and upper arm, initial encounter: Secondary | ICD-10-CM | POA: Diagnosis not present

## 2016-07-26 DIAGNOSIS — Y999 Unspecified external cause status: Secondary | ICD-10-CM | POA: Diagnosis not present

## 2016-07-26 DIAGNOSIS — Y92014 Private driveway to single-family (private) house as the place of occurrence of the external cause: Secondary | ICD-10-CM | POA: Diagnosis not present

## 2016-07-26 DIAGNOSIS — W19XXXA Unspecified fall, initial encounter: Secondary | ICD-10-CM | POA: Diagnosis not present

## 2016-07-26 DIAGNOSIS — R0781 Pleurodynia: Secondary | ICD-10-CM | POA: Diagnosis not present

## 2016-07-26 DIAGNOSIS — Z8673 Personal history of transient ischemic attack (TIA), and cerebral infarction without residual deficits: Secondary | ICD-10-CM | POA: Diagnosis not present

## 2016-07-26 DIAGNOSIS — I2510 Atherosclerotic heart disease of native coronary artery without angina pectoris: Secondary | ICD-10-CM | POA: Diagnosis not present

## 2016-07-26 DIAGNOSIS — I10 Essential (primary) hypertension: Secondary | ICD-10-CM | POA: Insufficient documentation

## 2016-07-26 DIAGNOSIS — Z79899 Other long term (current) drug therapy: Secondary | ICD-10-CM | POA: Insufficient documentation

## 2016-07-26 DIAGNOSIS — T148XXA Other injury of unspecified body region, initial encounter: Secondary | ICD-10-CM | POA: Diagnosis not present

## 2016-07-26 DIAGNOSIS — Z87891 Personal history of nicotine dependence: Secondary | ICD-10-CM | POA: Diagnosis not present

## 2016-07-26 NOTE — ED Notes (Signed)
[name] (daughter): [phone]

## 2016-07-26 NOTE — ED Triage Notes (Signed)
Per EMS, pt complains of left hip and left clavicle pain since fall. Pt lost balance and fell, landing on her buttocks. Pt complains of left hip and left clavicle pain. Pt has shortening to left leg. Pt given 182mcg fentanyl.

## 2016-07-26 NOTE — ED Provider Notes (Signed)
[facility] DEPT Provider Note   CSN: [id number] Arrival date & time: 07/26/16  1548     History   Chief Complaint Chief Complaint  Patient presents with  . Hip Pain    left  . Shoulder Pain    left    HPI [name] is a 83 y.o. female with PMHx of CAD, CAD, CVA, DVT presents today by EMS complaining of left hip and left clavicle pain since fall. She states she had mechanical fall, she lost balance and fell, landing on her left side and buttocks on her driveway. She complains of left hip, rib cage, and left clavicle pain. She states it is a sharp, achy, 5/10 pain. She states movement of left arm and leg makes her pain worse. She has not tried anything since being brought here by EMS. Patient was given 100mg  fentanyl with moderate relief. She denes anticoagulation use, but admits to daily ASA. She denies LOC, head injury, neck injury. She denies any fevers, chills, nausea, vomiting, diarrhea. She states she is not able to stand.   The history is provided by the patient. No language interpreter was used.  Hip Pain  Pertinent negatives include no abdominal pain and no shortness of breath.  Shoulder Pain   Pertinent negatives include no numbness.    Past Medical History:  Diagnosis Date  . CAD (coronary artery disease)    mild nonobstructive disease on cath in 2003  . Cancer ([place])   . Cardiomyopathy    Probable Takotsubo, severe CP w/ normal cath in 1994. Severe CP in 2003 w/ widespread T wave inversions on ECG. Cath w/ minimal coronary disease but LV-gram showed periapical severe hypokinesis and basilar hyperkinesia (EF 40%). Last echo in 4/09 confirmed full LV functional recovery with EF 60%, no regional wall motion abnormalities, mild to moderate LVH.  Marland Kitchen CVA (cerebral infarction)   . Depression with anxiety 03-29-11   lost husband 3'09  . Diverticulitis   . DVT (deep venous thrombosis) ([place])    after venous ablation, R leg  . E. coli gastroenteritis 03-29-11   8'10   . Fracture 09/10/07   L2, status post vertebroplasty of L2 performed by IR  . GERD (gastroesophageal reflux disease)   . Headache(784.0)    migraines  . Hemorrhoids   . Hiatal hernia 03-29-11   no nerve problems  . Hyperlipidemia   . Hyperplastic colon polyp 06/2001  . Hypertension   . OA (osteoarthritis) of knee 03-29-11   w/ bilateral knee pain-not a problem now  . OSA (obstructive sleep apnea) 03-29-11   no cpap used, not a problem now.  . Osteoporosis   . Otalgia of both ears    Dr. Simeon Craft  . Polycythemia   . Stroke [facility]) 03-29-11   CVA x2 -last 10'12-?TIA(visual problems)  . Varicose vein     Patient Active Problem List   Diagnosis Date Noted  . Need for prophylactic vaccination and inoculation against influenza 02/10/2016  . PCP NOTES >>> 02/15/2015  . Superficial thrombophlebitis 04/06/2014  . Annual physical exam 08/18/2012  . Edema 04/14/2012  . Breast cancer ([place]) 11/05/2011  . Polycythemia 11/05/2011  . TIA (transient ischemic attack) 01/16/2011  . CARDIOMYOPATHY 08/03/2009  . DJD -- pain mgmt 08/16/2008  . ABDOMINAL PAIN, LEFT LOWER QUADRANT 07/05/2008  . GAIT DISTURBANCE 10/21/2007  . ANXIETY DEPRESSION 10/08/2007  . HYPERLIPIDEMIA 06/25/2007  . CAD (coronary artery disease) of artery bypass graft 06/25/2007  . GERD and Hiatal Hernia  06/25/2007  .  HTN (hypertension) 09/30/2006  . OSTEOPOROSIS 09/30/2006  . Migraine with aura 09/30/2006    Past Surgical History:  Procedure Laterality Date  . APPENDECTOMY    . BACK SURGERY    . BREAST BIOPSY    . BREAST CYST EXCISION    . BREAST LUMPECTOMY     left stage I left breast cancer  . CHOLECYSTECTOMY  04/09/2011   Procedure: LAPAROSCOPIC CHOLECYSTECTOMY WITH INTRAOPERATIVE CHOLANGIOGRAM;  Surgeon: Odis Hollingshead, MD;  Location: [facility];  Service: General;  Laterality: N/A;  . Dental Extraction     L maxillary molar  . KYPHOSIS SURGERY  08/2007   cement used  . OVARIAN CYST SURGERY     left  .  TONSILLECTOMY    . TOTAL KNEE ARTHROPLASTY  06/2010   left  . TUBAL LIGATION      OB History    No data available       Home Medications    Prior to Admission medications   Medication Sig Start Date End Date Taking? Authorizing Provider  acetaminophen-codeine (TYLENOL #3) 300-30 MG tablet Take 1 tablet by mouth every 8 (eight) hours as needed. For migraine 07/05/16  Yes Colon Branch, MD  aspirin 81 MG tablet Take 81 mg by mouth daily.   Yes Historical Provider, MD  aspirin-acetaminophen-caffeine (EXCEDRIN MIGRAINE) [phone] MG tablet Take 1 tablet by mouth every 6 (six) hours as needed for headache.   Yes Historical Provider, MD  hydrochlorothiazide (HYDRODIURIL) 25 MG tablet Take 1 tablet (25 mg total) by mouth daily as needed (swelling in the legs ). 07/05/16  Yes Colon Branch, MD  hydroxyurea (HYDREA) 500 MG capsule Take 500 mg by mouth every other day. May take with food to minimize GI side effects.   Yes Historical Provider, MD  LORazepam (ATIVAN) 0.5 MG tablet Take 1 tablet by mouth every morning and 2 tablets by mouth at bedtime as needed Patient taking differently: Take 1 tablet by mouth every morning and 1 tablet by mouth at bedtime as needed 07/12/16  Yes Colon Branch, MD  metoprolol (LOPRESSOR) 50 MG tablet Take 1 tablet (50 mg total) by mouth 2 (two) times daily. 03/12/16  Yes Colon Branch, MD  omeprazole (PRILOSEC) 20 MG capsule Take 20 mg by mouth daily.   Yes Historical Provider, MD  capsicum (ZOSTRIX) 0.075 % topical cream Apply 1 application topically 2 (two) times daily. Patient not taking: Reported on 07/26/2016 04/10/16   Volanda Napoleon, MD    Family History Family History  Problem Relation Age of Onset  . Breast cancer Mother   . Heart disease Father     MIs  . Colon cancer Other     cousin  . Breast cancer Sister   . Leukemia Brother     GM    Social History Social History  Substance Use Topics  . Smoking status: Former Smoker    Packs/day: 1.00    Years:  37.00    Types: Cigarettes    Start date: 06/26/1951    Quit date: 05/14/1988  . Smokeless tobacco: Never Used     Comment: quit 25 years ago  . Alcohol use 0.6 oz/week    1 Cans of beer per week     Comment: occasional/social     Allergies   Penicillins; Atorvastatin; Ezetimibe; Fluvastatin sodium; Magnesium hydroxide; Meloxicam; Omeprazole magnesium; Pneumovax [pneumococcal polysaccharide vaccine]; Quinapril hcl; Simvastatin; Topamax [topiramate]; Valsartan; Vit d-vit e-safflower oil; and Carvedilol   Review of Systems  Review of Systems  Constitutional: Negative for chills and fever.  Respiratory: Negative for cough and shortness of breath.   Gastrointestinal: Negative for abdominal pain, diarrhea, nausea and vomiting.  Genitourinary: Negative for difficulty urinating and dysuria.  Musculoskeletal: Positive for arthralgias. Negative for neck pain and neck stiffness.  Skin: Negative for wound.  Neurological: Negative for numbness.    Physical Exam Updated Vital Signs BP (!) 150/66   Pulse 72   Temp 98 F (36.7 C)   Resp 16   SpO2 98%   Physical Exam  Constitutional: She is oriented to person, place, and time. She appears well-developed and well-nourished.  Well appearing  HENT:  Head: Normocephalic and atraumatic.  Nose: Nose normal.  Mouth/Throat: Oropharynx is clear and moist.  Eyes: Conjunctivae and EOM are normal.  Neck: Normal range of motion.  Cardiovascular: Normal rate, normal heart sounds and intact distal pulses.   No murmur heard. Intact distal pulses to all 4 extremities.   Pulmonary/Chest: Effort normal and breath sounds normal. No respiratory distress. She has no wheezes.  Normal work of breathing. No respiratory distress noted.   Abdominal: Soft. There is no tenderness. There is no rebound and no guarding.  Musculoskeletal: Normal range of motion.  FROM of left shoulder and left hip. No obvious wound, redness, swelling, bruising to left shoulder or  hip. Left hip was TTP. Left shoulder not TTP. Left clavicle with no obvious wound, redness, deformity, or TTP. Pt exhibited pain on shoulder abduction and shoulder flexion. Both legs equal in length on my exam. No obvious deformity, tenderness, TTP to left wrist, fingers, knee, ankle.   Neurological: She is alert and oriented to person, place, and time.  Sensation intact to all 4 extremities. Muscle strength 5/5 to upper and lower extremities against resistance to left shoulder and left hip flexion and extension.   Skin: Skin is warm.  Psychiatric: She has a normal mood and affect. Her behavior is normal.  Nursing note and vitals reviewed.    ED Treatments / Results  Labs (all labs ordered are listed, but only abnormal results are displayed) Labs Reviewed - No data to display  EKG  EKG Interpretation None       Radiology Dg Chest 2 View  Result Date: 07/26/2016 CLINICAL DATA:  Left chest pain following fall today. Initial encounter. EXAM: CHEST  2 VIEW COMPARISON:  07/25/2015 and prior exams FINDINGS: The cardiomediastinal silhouette is unremarkable. There is no evidence of focal airspace disease, pulmonary edema, suspicious pulmonary nodule/mass, pleural effusion, or pneumothorax. No acute bony abnormalities are identified. Surgical clips overlying the left breast again noted. IMPRESSION: No active cardiopulmonary disease. Electronically Signed   By: Margarette Canada M.D.   On: 07/26/2016 18:49   Ct Hip Left Wo Contrast  Result Date: 07/26/2016 CLINICAL DATA:  Acute left hip pain following fall. Left leg shortening. Initial encounter. EXAM: CT OF THE LEFT HIP WITHOUT CONTRAST TECHNIQUE: Multidetector CT imaging of the left hip was performed according to the standard protocol. Multiplanar CT image reconstructions were also generated. COMPARISON:  07/26/2016 radiographs. 10/19/2015 and prior abdominal/ pelvic CTs. FINDINGS: Bones/Joint/Cartilage There is no evidence of acute fracture,  subluxation or dislocation. No joint abnormalities are identified. Ligaments Suboptimally assessed by CT. Muscles and Tendons Unremarkable Soft tissues Unremarkable IMPRESSION: No acute abnormalities. Electronically Signed   By: Margarette Canada M.D.   On: 07/26/2016 19:22   Dg Shoulder Left  Result Date: 07/26/2016 CLINICAL DATA:  Left shoulder pain following fall today. Initial  encounter. EXAM: LEFT SHOULDER - 2+ VIEW COMPARISON:  None. FINDINGS: There is no evidence of fracture or dislocation. There is no evidence of arthropathy or other focal bone abnormality. Soft tissues are unremarkable. IMPRESSION: Negative. Electronically Signed   By: Margarette Canada M.D.   On: 07/26/2016 18:48   Dg Hip Unilat With Pelvis 2-3 Views Left  Result Date: 07/26/2016 CLINICAL DATA:  Left hip pain since fall earlier today. EXAM: DG HIP (WITH OR WITHOUT PELVIS) 2-3V LEFT COMPARISON:  None. FINDINGS: Frontal pelvis shows no fracture. Bones are diffusely demineralized. SI joints and symphysis pubis are unremarkable. AP and frog-leg lateral views of the left hip are unremarkable. IMPRESSION: Negative. Electronically Signed   By: Misty Stanley M.D.   On: 07/26/2016 17:22    Procedures Procedures (including critical care time)  Medications Ordered in ED Medications - No data to display   Initial Impression / Assessment and Plan / ED Course  I have reviewed the triage vital signs and the nursing notes.  Pertinent labs & imaging results that were available during my care of the patient were reviewed by me and considered in my medical decision making (see chart for details).    Patient here s/p mechanical fall to left hip and shoulder. No LOC. Left shoulder and left hip have FROM, however does have some pain associated with it. Also has left lateral rib pain. No deformity or TTP to left clavicle. On exam pt in NAD, afebrile, hemodynamically stable. Legs appeared equal length on my exam. Xrays of hip, chest, and left shoulder  all negative for fracture or dislocation. CT of hip also shows negative for acute findings. I feel patient is safe for discharge with follow up with orthopedic surgery. Patient advised to take tylenol as needed for pain as well as rest, ice, and elevation of areas affected. Reasons to return to ED discussed.   Pt also seen and evaluated by Dr. Zenia Resides who agreed with assessment and plan.    Final Clinical Impressions(s) / ED Diagnoses   Final diagnoses:  Acute pain of left shoulder  Left hip pain  Fall, initial encounter  Rib pain on left side    New Prescriptions Discharge Medication List as of 07/26/2016  7:43 PM       [place], [place] 07/27/16 [phone]

## 2016-07-26 NOTE — ED Provider Notes (Signed)
Medical screening examination/treatment/procedure(s) were conducted as a shared visit with non-physician practitioner(s) and myself.  I personally evaluated the patient during the encounter.   EKG Interpretation None     83 year old female here after mechanical fall onto her left hip. No loss of consciousness. Complains of pain also to her left shoulder but has full range of motion. Also notes some left lateral rib pain. Denies any dyspnea. On physical exam patient able to raise her left leg. Although somewhat painful. X-ray of the left hip and pelvis per radiology without acute findings. We'll obtain further imaging to rule out occult fracture   Lacretia Leigh, MD 07/26/16 [phone]

## 2016-07-26 NOTE — Discharge Instructions (Signed)
Please schedule appointment with Orthopedic Surgery, Dr. Erlinda Hong, regarding today's visit in 2-5 days. Please use rest, ice, compression, and elevation to the areas affected. Please use tylenol as needed for pain relief. Please also follow up with your primary care provider in 1 week regarding today's visit.    Contact a health care provider if: Your pain increases, and medicine does not help. Your joint pain does not improve within 3 days. You have increased bruising or swelling. You have a fever. You lose 10 lb (4.5 kg) or more without trying. Get help right away if: You are not able to move the joint. Your fingers or toes become numb or they turn cold and blue.

## 2016-07-26 NOTE — ED Notes (Signed)
Bed: BM15 Expected date:  Expected time:  Means of arrival:  Comments: 83 yo fall, hip pain

## 2016-08-01 DIAGNOSIS — S20212A Contusion of left front wall of thorax, initial encounter: Secondary | ICD-10-CM | POA: Diagnosis not present

## 2016-08-02 ENCOUNTER — Ambulatory Visit: Payer: Medicare Other | Admitting: Hematology & Oncology

## 2016-08-02 ENCOUNTER — Other Ambulatory Visit: Payer: Medicare Other

## 2016-08-03 ENCOUNTER — Telehealth: Payer: Self-pay | Admitting: Internal Medicine

## 2016-08-03 NOTE — Telephone Encounter (Signed)
Per PCP, unable to complete request, PCP has no influence on how long a Pt waits in ED. ED is on triage system.

## 2016-08-03 NOTE — Telephone Encounter (Signed)
Caller name: Relationship to patient:Self Can be reached: [phone]  Pharmacy:  Reason for call: Patient request that Dr. Larose Kells call [facility] and let them know that she is coming in for shoulder pain so that she does not have to sit in the waiting room.

## 2016-08-06 ENCOUNTER — Other Ambulatory Visit: Payer: Medicare Other

## 2016-08-06 ENCOUNTER — Ambulatory Visit: Payer: Medicare Other | Admitting: Hematology & Oncology

## 2016-08-08 DIAGNOSIS — M25552 Pain in left hip: Secondary | ICD-10-CM | POA: Diagnosis not present

## 2016-08-08 DIAGNOSIS — S39013A Strain of muscle, fascia and tendon of pelvis, initial encounter: Secondary | ICD-10-CM | POA: Diagnosis not present

## 2016-08-14 DIAGNOSIS — S4982XA Other specified injuries of left shoulder and upper arm, initial encounter: Secondary | ICD-10-CM | POA: Diagnosis not present

## 2016-08-14 DIAGNOSIS — M25552 Pain in left hip: Secondary | ICD-10-CM | POA: Diagnosis not present

## 2016-08-17 ENCOUNTER — Ambulatory Visit: Payer: Medicare Other | Admitting: Internal Medicine

## 2016-08-27 ENCOUNTER — Ambulatory Visit: Payer: Medicare Other | Attending: Family Medicine | Admitting: Physical Therapy

## 2016-08-27 ENCOUNTER — Encounter: Payer: Self-pay | Admitting: Physical Therapy

## 2016-08-27 DIAGNOSIS — R262 Difficulty in walking, not elsewhere classified: Secondary | ICD-10-CM | POA: Diagnosis not present

## 2016-08-27 DIAGNOSIS — M25552 Pain in left hip: Secondary | ICD-10-CM | POA: Diagnosis not present

## 2016-08-27 DIAGNOSIS — M25652 Stiffness of left hip, not elsewhere classified: Secondary | ICD-10-CM | POA: Diagnosis not present

## 2016-08-27 NOTE — Therapy (Signed)
[address] [place], [place], [address] Phone: [phone]   Fax:  [phone]  Physical Therapy Evaluation  Patient Details  Name: [name] MRN: [id number] Date of Birth: 1930/03/02 Referring Provider: Rip Harbour  Encounter Date: 08/27/2016      PT End of Session - 08/27/16 1553    Visit Number 1   Date for PT Re-Evaluation 10/27/16   PT Start Time 1525   PT Stop Time 1623   PT Time Calculation (min) 58 min   Activity Tolerance Patient tolerated treatment well;Patient limited by pain   Behavior During Therapy [facility] for tasks assessed/performed      Past Medical History:  Diagnosis Date  . CAD (coronary artery disease)    mild nonobstructive disease on cath in 2003  . Cancer ([place])   . Cardiomyopathy    Probable Takotsubo, severe CP w/ normal cath in 1994. Severe CP in 2003 w/ widespread T wave inversions on ECG. Cath w/ minimal coronary disease but LV-gram showed periapical severe hypokinesis and basilar hyperkinesia (EF 40%). Last echo in 4/09 confirmed full LV functional recovery with EF 60%, no regional wall motion abnormalities, mild to moderate LVH.  Marland Kitchen CVA (cerebral infarction)   . Depression with anxiety 03-29-11   lost husband 3'09  . Diverticulitis   . DVT (deep venous thrombosis) ([place])    after venous ablation, R leg  . E. coli gastroenteritis 03-29-11   8'10  . Fracture 09/10/07   L2, status post vertebroplasty of L2 performed by IR  . GERD (gastroesophageal reflux disease)   . Headache(784.0)    migraines  . Hemorrhoids   . Hiatal hernia 03-29-11   no nerve problems  . Hyperlipidemia   . Hyperplastic colon polyp 06/2001  . Hypertension   . OA (osteoarthritis) of knee 03-29-11   w/ bilateral knee pain-not a problem now  . OSA (obstructive sleep apnea) 03-29-11   no cpap used, not a problem now.  . Osteoporosis   . Otalgia of both ears    Dr. Simeon Craft  . Polycythemia   .  Stroke [facility]) 03-29-11   CVA x2 -last 10'12-?TIA(visual problems)  . Varicose vein     Past Surgical History:  Procedure Laterality Date  . APPENDECTOMY    . BACK SURGERY    . BREAST BIOPSY    . BREAST CYST EXCISION    . BREAST LUMPECTOMY     left stage I left breast cancer  . CHOLECYSTECTOMY  04/09/2011   Procedure: LAPAROSCOPIC CHOLECYSTECTOMY WITH INTRAOPERATIVE CHOLANGIOGRAM;  Surgeon: Odis Hollingshead, MD;  Location: [facility];  Service: General;  Laterality: N/A;  . Dental Extraction     L maxillary molar  . KYPHOSIS SURGERY  08/2007   cement used  . OVARIAN CYST SURGERY     left  . TONSILLECTOMY    . TOTAL KNEE ARTHROPLASTY  06/2010   left  . TUBAL LIGATION      There were no vitals filed for this visit.       Subjective Assessment - 08/27/16 1529    Subjective Patient rpeorts a fall on 07/23/16.  She was unloading her car and stepped of the driveway, she reports that she had some trouble walking after that due to pain in the left groin.  She reports she got to the point where she was using the walker to walk.  Her x-rays are negative for the left hip.   Limitations Standing;Walking;House hold activities  Patient Stated Goals have no pain and walk better   Currently in Pain? Yes   Pain Score 1    Pain Location Groin   Pain Orientation Left   Pain Descriptors / Indicators Sore   Pain Type Acute pain   Pain Onset More than a month ago   Pain Frequency Intermittent   Aggravating Factors  walking, lying down pain will go up to 8/10   Pain Relieving Factors rest and motions pain can be 1/10   Effect of Pain on Daily Activities really hard to walk            [facility] PT Assessment - 08/27/16 0001      Assessment   Medical Diagnosis left hip/groin pain   Referring Provider Rip Harbour   Onset Date/Surgical Date 07/23/16   Prior Therapy no     Precautions   Precautions None     Balance Screen   Has the patient fallen in the past 6 months Yes   How many times? 1    Has the patient had a decrease in activity level because of a fear of falling?  No   Is the patient reluctant to leave their home because of a fear of falling?  No     Home Environment   Additional Comments has stairs, lives alone, does some housework, was able to shop     Prior Function   Level of [place] [employer]ired   Leisure was going to the [employer] 2x/week     ROM / Strength   AROM / PROM / Strength AROM;Strength     Strength   Overall Strength Comments 3+/5 in the hips with some pain in the left groin and back area.       Palpation   Palpation comment she is very tender to the left hip flexor and the groin area     Ambulation/Gait   Gait Comments uses a SPC, slow, shortened toe off phase, pain 8/10 with weight bearing, patient seems to be very fearful with her gait and hesitant     Standardized Balance Assessment   Standardized Balance Assessment Timed Up and Go Test     Timed Up and Go Test   Normal TUG (seconds) 31                   [facility] Adult PT Treatment/Exercise - 08/27/16 0001      Exercises   Exercises Knee/Hip     Knee/Hip Exercises: Aerobic   Nustep level 4 x 6 minutes     Modalities   Modalities Electrical Stimulation;Moist Heat     Moist Heat Therapy   Number Minutes Moist Heat 15 Minutes   Moist Heat Location Hip     Electrical Stimulation   Electrical Stimulation Location left groin   Electrical Stimulation Action IFC   Electrical Stimulation Parameters sitting   Electrical Stimulation Goals Pain                  PT Short Term Goals - 08/27/16 1606      PT SHORT TERM GOAL #1   Title idependent with initial HEP   Time 2   Period Weeks   Status New           PT Long Term Goals - 08/27/16 1610      PT LONG TERM GOAL #1   Title decrease pain 50%   Time 8   Period Weeks   Status  New     PT LONG TERM GOAL #2   Title walk in the home without assistive device   Time 8    Period Weeks   Status New     PT LONG TERM GOAL #3   Title go up and down stairs step over step   Time 8   Period Weeks   Status New     PT LONG TERM GOAL #4   Title increase hip strength to 4/5   Time 8   Period Weeks   Status New     PT LONG TERM GOAL #5   Title walk 500 feet   Time 8   Period Weeks   Status New               Plan - 08/27/16 1553    Clinical Impression Statement Patient reports a fall on 07/23/16.  X-rays of the pelvis and hip are negative.  She has tenderness in the left hip flexor.  Her TUG time was 32 seconds.  She seems to be more fearful of falling.  She does have pain with stance and weight bearing, also c/o pain with lying down.   Rehab Potential Good   PT Frequency 2x / week   PT Duration 8 weeks   PT Treatment/Interventions ADLs/Self Care Home Management;Cryotherapy;Electrical Stimulation;Moist Heat;Ultrasound;Gait training;Stair training;Functional mobility training;Patient/family education;Therapeutic exercise;Balance training;Therapeutic activities;Manual techniques   PT Next Visit Plan slowly add exercises, gait and balance   Consulted and Agree with Plan of Care Patient      Patient will benefit from skilled therapeutic intervention in order to improve the following deficits and impairments:  Abnormal gait, Cardiopulmonary status limiting activity, Decreased activity tolerance, Decreased balance, Decreased mobility, Decreased strength, Decreased range of motion, Difficulty walking, Impaired flexibility, Pain  Visit Diagnosis: Pain in left hip - Plan: PT plan of care cert/re-cert  Difficulty in walking, not elsewhere classified - Plan: PT plan of care cert/re-cert  Stiffness of left hip, not elsewhere classified - Plan: PT plan of care cert/re-cert      G-Codes - 88/41/66 1623    Functional Assessment Tool Used (Outpatient Only) foto 67% limitation   Functional Limitation Mobility: Walking and moving around   Mobility: Walking and  Moving Around Current Status [phone]) At least 60 percent but less than 80 percent impaired, limited or restricted   Mobility: Walking and Moving Around Goal Status [phone]) At least 40 percent but less than 60 percent impaired, limited or restricted       Problem List Patient Active Problem List   Diagnosis Date Noted  . Need for prophylactic vaccination and inoculation against influenza 02/10/2016  . PCP NOTES >>> 02/15/2015  . Superficial thrombophlebitis 04/06/2014  . Annual physical exam 08/18/2012  . Edema 04/14/2012  . Breast cancer ([place]) 11/05/2011  . Polycythemia 11/05/2011  . TIA (transient ischemic attack) 01/16/2011  . CARDIOMYOPATHY 08/03/2009  . DJD -- pain mgmt 08/16/2008  . ABDOMINAL PAIN, LEFT LOWER QUADRANT 07/05/2008  . GAIT DISTURBANCE 10/21/2007  . ANXIETY DEPRESSION 10/08/2007  . HYPERLIPIDEMIA 06/25/2007  . CAD (coronary artery disease) of artery bypass graft 06/25/2007  . GERD and Hiatal Hernia  06/25/2007  . HTN (hypertension) 09/30/2006  . OSTEOPOROSIS 09/30/2006  . Migraine with aura 09/30/2006    Sumner Boast., PT 08/27/2016, 4:57 PM  [address] [place], [place], [address] Phone: [phone]   Fax:  [phone]  Name: [name] MRN: [id number]  Date of Birth: December 31, 1929

## 2016-08-30 ENCOUNTER — Ambulatory Visit: Payer: Medicare Other | Admitting: Physical Therapy

## 2016-08-30 ENCOUNTER — Encounter: Payer: Self-pay | Admitting: Physical Therapy

## 2016-08-30 DIAGNOSIS — M25652 Stiffness of left hip, not elsewhere classified: Secondary | ICD-10-CM

## 2016-08-30 DIAGNOSIS — M25552 Pain in left hip: Secondary | ICD-10-CM | POA: Diagnosis not present

## 2016-08-30 DIAGNOSIS — R262 Difficulty in walking, not elsewhere classified: Secondary | ICD-10-CM

## 2016-08-30 NOTE — Therapy (Addendum)
[address] [place], [place], [address] Phone: [phone]   Fax:  [phone]  Physical Therapy Treatment  Patient Details  Name: [name] MRN: [id number] Date of Birth: Sep 02, 1929 Referring Provider: Rip Harbour  Encounter Date: 08/30/2016      PT End of Session - 08/30/16 1442    Visit Number 2   Date for PT Re-Evaluation 10/27/16   PT Start Time 1400   PT Stop Time 1445   PT Time Calculation (min) 45 min   Activity Tolerance Patient tolerated treatment well   Behavior During Therapy [facility] for tasks assessed/performed      Past Medical History:  Diagnosis Date  . CAD (coronary artery disease)    mild nonobstructive disease on cath in 2003  . Cancer ([place])   . Cardiomyopathy    Probable Takotsubo, severe CP w/ normal cath in 1994. Severe CP in 2003 w/ widespread T wave inversions on ECG. Cath w/ minimal coronary disease but LV-gram showed periapical severe hypokinesis and basilar hyperkinesia (EF 40%). Last echo in 4/09 confirmed full LV functional recovery with EF 60%, no regional wall motion abnormalities, mild to moderate LVH.  Marland Kitchen CVA (cerebral infarction)   . Depression with anxiety 03-29-11   lost husband 3'09  . Diverticulitis   . DVT (deep venous thrombosis) ([place])    after venous ablation, R leg  . E. coli gastroenteritis 03-29-11   8'10  . Fracture 09/10/07   L2, status post vertebroplasty of L2 performed by IR  . GERD (gastroesophageal reflux disease)   . Headache(784.0)    migraines  . Hemorrhoids   . Hiatal hernia 03-29-11   no nerve problems  . Hyperlipidemia   . Hyperplastic colon polyp 06/2001  . Hypertension   . OA (osteoarthritis) of knee 03-29-11   w/ bilateral knee pain-not a problem now  . OSA (obstructive sleep apnea) 03-29-11   no cpap used, not a problem now.  . Osteoporosis   . Otalgia of both ears    Dr. Simeon Craft  . Polycythemia   . Stroke [facility]) 03-29-11   CVA  x2 -last 10'12-?TIA(visual problems)  . Varicose vein     Past Surgical History:  Procedure Laterality Date  . APPENDECTOMY    . BACK SURGERY    . BREAST BIOPSY    . BREAST CYST EXCISION    . BREAST LUMPECTOMY     left stage I left breast cancer  . CHOLECYSTECTOMY  04/09/2011   Procedure: LAPAROSCOPIC CHOLECYSTECTOMY WITH INTRAOPERATIVE CHOLANGIOGRAM;  Surgeon: Odis Hollingshead, MD;  Location: [facility];  Service: General;  Laterality: N/A;  . Dental Extraction     L maxillary molar  . KYPHOSIS SURGERY  08/2007   cement used  . OVARIAN CYST SURGERY     left  . TONSILLECTOMY    . TOTAL KNEE ARTHROPLASTY  06/2010   left  . TUBAL LIGATION      There were no vitals filed for this visit.      Subjective Assessment - 08/30/16 1410    Subjective Patient reports that she was in a lot of pain after last session and could hardly walk last night after vaccuuming her house. The patient also refused to go onto the nustep stating that it made her worse.    Currently in Pain? Yes   Pain Score 1    Pain Location Groin   Pain Orientation Left   Pain Descriptors / Indicators Sore  [facility] Adult PT Treatment/Exercise - 08/30/16 0001      High Level Balance   High Level Balance Activities Side stepping;Backward walking   High Level Balance Comments x 2 across room, HHA with side and backwards without SPC     Knee/Hip Exercises: Standing   Other Standing Knee Exercises DLS on airex x 4 with marching      Knee/Hip Exercises: Seated   Long Arc Quad Strengthening;Left;3 sets;10 reps   Long Arc Quad Weight 2 lbs.   Ball Squeeze 3 x 10   Clamshell with TheraBand Red  3 x 10   Marching Limitations 3 x 10   Marching Weights 2 lbs.   Hamstring Curl Strengthening;Left;3 sets;10 reps   Hamstring Limitations yellow tband     Moist Heat Therapy   Number Minutes Moist Heat 15 Minutes   Moist Heat Location Hip                  PT Short Term  Goals - 08/27/16 1606      PT SHORT TERM GOAL #1   Title idependent with initial HEP   Time 2   Period Weeks   Status New           PT Long Term Goals - 08/27/16 1610      PT LONG TERM GOAL #1   Title decrease pain 50%   Time 8   Period Weeks   Status New     PT LONG TERM GOAL #2   Title walk in the home without assistive device   Time 8   Period Weeks   Status New     PT LONG TERM GOAL #3   Title go up and down stairs step over step   Time 8   Period Weeks   Status New     PT LONG TERM GOAL #4   Title increase hip strength to 4/5   Time 8   Period Weeks   Status New     PT LONG TERM GOAL #5   Title walk 500 feet   Time 8   Period Weeks   Status New               Plan - 08/30/16 1443    Clinical Impression Statement Patient reported that she was hurting after last session and refused to get on the nustep today. She is very fearful of falling and was able to walk across the room forward without any HHA. Side stepping and backwards walking required HHA. The patient stated that being on the airex pad was scary therefore we progressed her from standing to little marching to big marching with HHA. She also stated that she thinks the estim triggered something in her groin but liked the heat.    PT Next Visit Plan Slowly continue exercise with gait and balance.       Patient will benefit from skilled therapeutic intervention in order to improve the following deficits and impairments:  Abnormal gait, Cardiopulmonary status limiting activity, Decreased activity tolerance, Decreased balance, Decreased mobility, Decreased strength, Decreased range of motion, Difficulty walking, Impaired flexibility, Pain  Visit Diagnosis: Pain in left hip  Difficulty in walking, not elsewhere classified  Stiffness of left hip, not elsewhere classified     Problem List Patient Active Problem List   Diagnosis Date Noted  . Need for prophylactic vaccination and inoculation  against influenza 02/10/2016  . PCP NOTES >>> 02/15/2015  . Superficial thrombophlebitis 04/06/2014  . Annual physical  exam 08/18/2012  . Edema 04/14/2012  . Breast cancer ([place]) 11/05/2011  . Polycythemia 11/05/2011  . TIA (transient ischemic attack) 01/16/2011  . CARDIOMYOPATHY 08/03/2009  . DJD -- pain mgmt 08/16/2008  . ABDOMINAL PAIN, LEFT LOWER QUADRANT 07/05/2008  . GAIT DISTURBANCE 10/21/2007  . ANXIETY DEPRESSION 10/08/2007  . HYPERLIPIDEMIA 06/25/2007  . CAD (coronary artery disease) of artery bypass graft 06/25/2007  . GERD and Hiatal Hernia  06/25/2007  . HTN (hypertension) 09/30/2006  . OSTEOPOROSIS 09/30/2006  . Migraine with aura 09/30/2006   PHYSICAL THERAPY DISCHARGE SUMMARY  PHYSICAL THERAPY DISCHARGE SUMMARY   Plan: Patient agrees to discharge.  Patient goals were not met. Patient is being discharged due to not returning since the last visit.  ?????          Alan Mulder SPTA 08/30/2016, 2:54 PM  [address] [place], [place], [address] Phone: [phone]   Fax:  [phone]  Name: [name] MRN: [id number] Date of Birth: [birth year]

## 2016-09-03 ENCOUNTER — Ambulatory Visit: Payer: Medicare Other | Admitting: Physical Therapy

## 2016-09-03 DIAGNOSIS — M25552 Pain in left hip: Secondary | ICD-10-CM | POA: Diagnosis not present

## 2016-09-05 ENCOUNTER — Ambulatory Visit: Payer: Medicare Other | Admitting: Physical Therapy

## 2016-09-06 ENCOUNTER — Ambulatory Visit: Payer: Medicare Other | Admitting: Physical Therapy

## 2016-09-18 ENCOUNTER — Telehealth: Payer: Self-pay

## 2016-09-18 MED ORDER — ACETAMINOPHEN-CODEINE #3 300-30 MG PO TABS: 1.0000 | ORAL_TABLET | Freq: Three times a day (TID) | ORAL | 0 refills | Status: DC | PRN

## 2016-09-18 NOTE — Telephone Encounter (Signed)
Okay #60, no refills 

## 2016-09-18 NOTE — Telephone Encounter (Signed)
Pt is requesting refill on Tylenol #3.   Last OV: 07/17/2016 Last Fill: 07/05/2016 #60 and 0RF UDS: 06/01/2015 Low risk  Please advise.

## 2016-09-18 NOTE — Telephone Encounter (Signed)
Rx printed, awaiting MD signature.  

## 2016-09-18 NOTE — Telephone Encounter (Signed)
Rx faxed to [facility] pharmacy.  

## 2016-09-21 ENCOUNTER — Telehealth: Payer: Self-pay

## 2016-09-21 MED ORDER — LORAZEPAM 0.5 MG PO TABS: ORAL_TABLET | ORAL | 1 refills | Status: DC

## 2016-09-21 NOTE — Telephone Encounter (Signed)
Rx faxed to [facility] pharmacy.  

## 2016-09-21 NOTE — Telephone Encounter (Signed)
Rx printed, awaiting MD signature.  

## 2016-09-21 NOTE — Telephone Encounter (Signed)
Pt is requesting refill on Lorazepam.  Last OV: 07/17/2016 Last Fill: 07/12/2016 #90 and 1RF Pt sig: 1 tab in AM and 2 tab qhs PRN UDS: 06/01/2015 Low risk  Please advise.

## 2016-09-21 NOTE — Telephone Encounter (Signed)
Okay #90, one refill

## 2016-09-27 ENCOUNTER — Ambulatory Visit (INDEPENDENT_AMBULATORY_CARE_PROVIDER_SITE_OTHER): Payer: Medicare Other | Admitting: Obstetrics & Gynecology

## 2016-09-27 ENCOUNTER — Encounter: Payer: Self-pay | Admitting: Obstetrics & Gynecology

## 2016-09-27 VITALS — BP 144/82 | Ht 65.0 in | Wt 159.0 lb

## 2016-09-27 DIAGNOSIS — C50412 Malignant neoplasm of upper-outer quadrant of left female breast: Secondary | ICD-10-CM

## 2016-09-27 DIAGNOSIS — Z01411 Encounter for gynecological examination (general) (routine) with abnormal findings: Secondary | ICD-10-CM

## 2016-09-27 DIAGNOSIS — N644 Mastodynia: Secondary | ICD-10-CM

## 2016-09-27 DIAGNOSIS — Z124 Encounter for screening for malignant neoplasm of cervix: Secondary | ICD-10-CM

## 2016-09-27 NOTE — Progress Notes (Signed)
[name] November 18, 1929 [id number]   History:    83 y.o. G3P3L3  widowed.  Abstinent.  RP:  New patient presenting for annual gyn exam and persistent Left breast pain post fall 07/2016  HPI:  H/O Stage I (T1BN0M0 ) infiltrating duct carcinoma of the left breast - diagnosed May 2013, Lumpectomy done.  Fell on Left side in 07/2016.  Bruised a lot, but no fracture.  Doing PT currently.  Tender Left breast since then, on-off.  No lump felt.  No nipple d/c.  No skin change.  Menopause x many years.  No PMB.  No pelvic pain.  No vaginal d/c.     Past medical history,surgical history, family history and social history were all reviewed and documented in the EPIC chart.  Gynecologic History No LMP recorded. Patient is postmenopausal. Contraception: abstinence and post menopausal status Last Pap: 4 yrs ago. Results were: normal per patient Last mammogram: 2017. Results were: normal  Obstetric History OB History  Gravida Para Term Preterm AB Living  3 3       3   SAB TAB Ectopic Multiple Live Births               # Outcome Date GA Lbr Len/2nd Weight Sex Delivery Anes PTL Lv  3 Para           2 Para           1 Para                ROS: A ROS was performed and pertinent positives and negatives are included in the history.  GENERAL: No fevers or chills. HEENT: No change in vision, no earache, sore throat or sinus congestion. NECK: No pain or stiffness. CARDIOVASCULAR: No chest pain or pressure. No palpitations. PULMONARY: No shortness of breath, cough or wheeze. GASTROINTESTINAL: No abdominal pain, nausea, vomiting or diarrhea, melena or bright red blood per rectum. GENITOURINARY: No urinary frequency, urgency, hesitancy or dysuria. MUSCULOSKELETAL: No joint or muscle pain, no back pain, no recent trauma. DERMATOLOGIC: No rash, no itching, no lesions. ENDOCRINE: No polyuria, polydipsia, no heat or cold intolerance. No recent change in weight. HEMATOLOGICAL: No anemia or easy bruising or  bleeding. NEUROLOGIC: No headache, seizures, numbness, tingling or weakness. PSYCHIATRIC: No depression, no loss of interest in normal activity or change in sleep pattern.     Exam:   BP (!) 144/82   Ht 5\' 5"  (1.651 m)   Wt 159 lb (72.1 kg)   BMI 26.46 kg/m   Body mass index is 26.46 kg/m.  General appearance : Well developed well nourished female. No acute distress HEENT: Eyes: no retinal hemorrhage or exudates,  Neck supple, trachea midline, no carotid bruits, no thyroidmegaly Lungs: Clear to auscultation, no rhonchi or wheezes, or rib retractions  Heart: Regular rate and rhythm, no murmurs or gallops Breast:Examined in sitting and supine position were symmetrical in appearance, no palpable masses.  Lt breast tender from 5 to 9 O'clock below nipple area.  Rt breast non-tender.  No skin retraction, no nipple inversion, no nipple discharge, no skin discoloration, no axillary or supraclavicular lymphadenopathy Abdomen: no palpable masses or tenderness, no rebound or guarding Extremities: no edema or skin discoloration or tenderness  Pelvic:  Bartholin, Urethra, Skene Glands: Within normal limits             Vagina: No gross lesions or discharge  Cervix: No gross lesions or discharge.  Pap reflex done.  Uterus  AV, normal  size, shape and consistency, non-tender and mobile  Adnexa  Without masses or tenderness  Anus and perineum  normal     Assessment/Plan:  83 y.o. female for annual exam   1. Encounter for gynecological examination with abnormal finding Normal Gyn exam except ASxic Atrophic Vaginitis and Tenderness of the Left Breast.  Pap reflex done at patient's preference.    2. Breast tenderness in female Tender from 5 O'clock to 9 O'clock below the nipple on the left breast.  No nodule or mass felt.  Will schedule Lt Dx Mammo and Breast US.  3. Malignant neoplasm of upper-outer quadrant of left female breast ([place]) R/O recurrence.  Counseling on above issues >50% x 30  minutes.  Princess Bruins MD, 3:55 PM 09/27/2016

## 2016-09-27 NOTE — Patient Instructions (Signed)
1. Encounter for gynecological examination with abnormal finding Normal Gyn exam except ASxic Atrophic Vaginitis and Tenderness of the Left Breast.  Pap reflex done at patient's preference.    2. Breast tenderness in female Tender from 5 O'clock to 9 O'clock below the nipple on the left breast.  No nodule or mass felt.  Will schedule Lt Dx Mammo and Breast US.  3. Malignant neoplasm of upper-outer quadrant of left female breast ([place]) R/O recurrence.   [name], it was a pleasure to meet you today!  I will inform you of your results as soon as available.

## 2016-09-28 ENCOUNTER — Telehealth: Payer: Self-pay | Admitting: *Deleted

## 2016-09-28 DIAGNOSIS — N644 Mastodynia: Secondary | ICD-10-CM

## 2016-09-28 DIAGNOSIS — Z853 Personal history of malignant neoplasm of breast: Secondary | ICD-10-CM

## 2016-09-28 DIAGNOSIS — Z124 Encounter for screening for malignant neoplasm of cervix: Secondary | ICD-10-CM | POA: Diagnosis not present

## 2016-09-28 NOTE — Telephone Encounter (Signed)
Appointment on 10/02/16 @ 1:20pm at breast center, pt informed

## 2016-09-28 NOTE — Telephone Encounter (Signed)
-----   Message from Princess Bruins, MD sent at 09/27/2016  4:17 PM EDT ----- Regarding: Lt Dx Mammo/US Left breast pain/tenderness from 5 to 9 O'clock below nipple.  H/O Lt Breast Ca 2013.

## 2016-09-28 NOTE — Addendum Note (Signed)
Addended by: Thurnell Garbe A on: 09/28/2016 01:18 PM   Modules accepted: Orders

## 2016-10-02 ENCOUNTER — Ambulatory Visit
Admission: RE | Admit: 2016-10-02 | Discharge: 2016-10-02 | Disposition: A | Discharge status: Home / Self Care | Payer: Medicare Other | Source: Ambulatory Visit | Attending: Obstetrics & Gynecology | Admitting: Obstetrics & Gynecology

## 2016-10-02 DIAGNOSIS — Z853 Personal history of malignant neoplasm of breast: Secondary | ICD-10-CM

## 2016-10-02 DIAGNOSIS — R922 Inconclusive mammogram: Secondary | ICD-10-CM | POA: Diagnosis not present

## 2016-10-02 DIAGNOSIS — N644 Mastodynia: Secondary | ICD-10-CM

## 2016-10-02 DIAGNOSIS — M7062 Trochanteric bursitis, left hip: Secondary | ICD-10-CM | POA: Diagnosis not present

## 2016-10-02 DIAGNOSIS — M545 Low back pain: Secondary | ICD-10-CM | POA: Diagnosis not present

## 2016-10-02 DIAGNOSIS — M25552 Pain in left hip: Secondary | ICD-10-CM | POA: Diagnosis not present

## 2016-10-02 LAB — PAP IG W/ RFLX HPV ASCU

## 2016-10-04 ENCOUNTER — Other Ambulatory Visit (HOSPITAL_BASED_OUTPATIENT_CLINIC_OR_DEPARTMENT_OTHER): Payer: Medicare Other

## 2016-10-04 ENCOUNTER — Ambulatory Visit (HOSPITAL_BASED_OUTPATIENT_CLINIC_OR_DEPARTMENT_OTHER): Payer: Medicare Other | Admitting: Hematology & Oncology

## 2016-10-04 DIAGNOSIS — C50011 Malignant neoplasm of nipple and areola, right female breast: Secondary | ICD-10-CM

## 2016-10-04 DIAGNOSIS — Z853 Personal history of malignant neoplasm of breast: Secondary | ICD-10-CM | POA: Diagnosis not present

## 2016-10-04 DIAGNOSIS — Z171 Estrogen receptor negative status [ER-]: Secondary | ICD-10-CM

## 2016-10-04 DIAGNOSIS — D751 Secondary polycythemia: Secondary | ICD-10-CM

## 2016-10-04 DIAGNOSIS — D45 Polycythemia vera: Secondary | ICD-10-CM | POA: Diagnosis not present

## 2016-10-04 DIAGNOSIS — Z170 Estrogen receptor positive status [ER+]: Secondary | ICD-10-CM

## 2016-10-04 DIAGNOSIS — C50012 Malignant neoplasm of nipple and areola, left female breast: Secondary | ICD-10-CM

## 2016-10-04 LAB — CBC WITH DIFFERENTIAL (CANCER CENTER ONLY)
BASO#: 0 10*3/uL (ref 0.0–0.2)
BASO%: 0.2 % (ref 0.0–2.0)
EOS%: 0.8 % (ref 0.0–7.0)
Eosinophils Absolute: 0.1 10*3/uL (ref 0.0–0.5)
HCT: 41.9 % (ref 34.8–46.6)
HGB: 12.7 g/dL (ref 11.6–15.9)
LYMPH#: 1.3 10*3/uL (ref 0.9–3.3)
LYMPH%: 10.5 % — ABNORMAL LOW (ref 14.0–48.0)
MCH: 24.1 pg — ABNORMAL LOW (ref 26.0–34.0)
MCHC: 30.3 g/dL — ABNORMAL LOW (ref 32.0–36.0)
MCV: 80 fL — ABNORMAL LOW (ref 81–101)
MONO#: 1 10*3/uL — ABNORMAL HIGH (ref 0.1–0.9)
MONO%: 8.2 % (ref 0.0–13.0)
NEUT#: 9.8 10*3/uL — ABNORMAL HIGH (ref 1.5–6.5)
NEUT%: 80.3 % — ABNORMAL HIGH (ref 39.6–80.0)
Platelets: 516 10*3/uL — ABNORMAL HIGH (ref 145–400)
RBC: 5.27 10*6/uL (ref 3.70–5.32)
RDW: 17.1 % — ABNORMAL HIGH (ref 11.1–15.7)
WBC: 12.1 10*3/uL — ABNORMAL HIGH (ref 3.9–10.0)

## 2016-10-04 LAB — COMPREHENSIVE METABOLIC PANEL (CC13)
ALT: 10 IU/L (ref 0–32)
AST (SGOT): 18 IU/L (ref 0–40)
Albumin, Serum: 4.2 g/dL (ref 3.5–4.7)
Albumin/Globulin Ratio: 1.6 (ref 1.2–2.2)
Alkaline Phosphatase, S: 114 IU/L (ref 39–117)
BUN/Creatinine Ratio: 12 (ref 12–28)
BUN: 11 mg/dL (ref 8–27)
Bilirubin Total: 0.7 mg/dL (ref 0.0–1.2)
Calcium, Ser: 9.5 mg/dL (ref 8.7–10.3)
Carbon Dioxide, Total: 29 mmol/L (ref 18–29)
Chloride, Ser: 101 mmol/L (ref 96–106)
Creatinine, Ser: 0.91 mg/dL (ref 0.57–1.00)
GFR calc Af Amer: 67 mL/min/{1.73_m2} (ref >59)
GFR calc non Af Amer: 58 mL/min/{1.73_m2} — ABNORMAL LOW (ref >59)
Globulin, Total: 2.7 g/dL (ref 1.5–4.5)
Glucose: 109 mg/dL — ABNORMAL HIGH (ref 65–99)
Potassium, Ser: 3.9 mmol/L (ref 3.5–5.2)
Sodium: 136 mmol/L (ref 134–144)
Total Protein: 6.9 g/dL (ref 6.0–8.5)

## 2016-10-04 MED ORDER — HYDROXYUREA 500 MG PO CAPS
500.0000 mg | ORAL_CAPSULE | Freq: Every day | ORAL | 12 refills | Status: DC
Start: 2016-10-04 — End: 2018-02-13

## 2016-10-04 NOTE — Progress Notes (Signed)
Hematology and Oncology Follow Up Visit  [name] [id number] Apr 06, 1930 83 y.o. 10/04/2016   Principle Diagnosis:   Polycythemia vera-JAK2 positive  Stage I (T1bN0M0 ) infiltrating duct carcinoma the left breast   Current Therapy:    #1 phlebotomy to maintain hematocrit below 45%.  #2 Hydrea 500 mg by mouth every day.  #3 Femara 2.5 mg by mouth daily - stopped 08/2015     Interim History:  [name] is back for followup. We last saw her back in February. Since then, she had a fall. She fell in her driveway. Thankfully, she did not break anything. She does have a lot of arthralgias and myalgias. She sees her orthopedist tomorrow.  She's had no problems with pain in her hands or feet. She's had no rashes.  She has done well with the Hydrea.  She's had no fever. She had no problem with infections over the wintertime.  She did have a nice Mother's Day weekend.  She's had no change in bowel or bladder habits.  Currently, her performance status is ECOG 2.   Medications:  Current Outpatient Prescriptions:  .  acetaminophen-codeine (TYLENOL #3) 300-30 MG tablet, Take 1 tablet by mouth every 8 (eight) hours as needed. For migraine, Disp: 60 tablet, Rfl: 0 .  aspirin 81 MG tablet, Take 81 mg by mouth daily., Disp: , Rfl:  .  aspirin-acetaminophen-caffeine (EXCEDRIN MIGRAINE) 250-250-65 MG tablet, Take 1 tablet by mouth every 6 (six) hours as needed for headache., Disp: , Rfl:  .  hydrochlorothiazide (HYDRODIURIL) 25 MG tablet, Take 1 tablet (25 mg total) by mouth daily as needed (swelling in the legs )., Disp: 90 tablet, Rfl: 0 .  hydroxyurea (HYDREA) 500 MG capsule, Take 1 capsule (500 mg total) by mouth daily. May take with food to minimize GI side effects., Disp: 30 capsule, Rfl: 12 .  LORazepam (ATIVAN) 0.5 MG tablet, Take 1 tablet by mouth every morning and 2 tablets by mouth at bedtime as needed, Disp: 90 tablet, Rfl: 1 .  metoprolol (LOPRESSOR) 50 MG tablet, Take 1  tablet (50 mg total) by mouth 2 (two) times daily., Disp: 180 tablet, Rfl: 1 .  omeprazole (PRILOSEC) 20 MG capsule, Take 20 mg by mouth daily., Disp: , Rfl:   Allergies:  Allergies  Allergen Reactions  . Penicillins Other (See Comments), Rash and Hives    Other Reaction: OTHER REACTION Other Reaction: OTHER REACTION Mouth blisters 03-29-11  . Atorvastatin Diarrhea    REACTION: diarrhe  . Ezetimibe     REACTION: nausea  . Fluvastatin Sodium Other (See Comments)    "Can't remember"  . Magnesium Hydroxide Other (See Comments)    REACTION: triggers HAs  . Meloxicam Other (See Comments)    Pt seeing auro's / spots.    . Omeprazole Magnesium     auras  . Pneumovax [Pneumococcal Polysaccharide Vaccine]     Local reaction  . Quinapril Hcl Other (See Comments)    03-29-11 "feelings of tiredness"  . Simvastatin Diarrhea  . Topamax [Topiramate] Itching  . Valsartan Itching and Other (See Comments)    REACTION: toes itch  . Vit D-Vit E-Safflower Oil Other (See Comments)    Headaches  . Carvedilol     "teeth hurt when I took it"    Past Medical History, Surgical history, Social history, and Family History were reviewed and updated.  Review of Systems: As above  Physical Exam:  weight is 159 lb 1.9 oz (72.2 kg). Her oral temperature  is 98.7 F (37.1 C). Her blood pressure is 152/57 (abnormal) and her pulse is 65. Her respiration is 20 and oxygen saturation is 99%.   Well-developed and well-nourished white female. Head and neck exam shows no ocular or oral lesions.She does have a healing vesicular macular-type lesion just left of the midline between the lip and nares. She has no palpable cervical or supraclavicular lymph nodes. Lungs are clear bilaterally. Cardiac exam regular rate and rhythm with a normal S1 and S2. There are no murmurs rubs or bruits. Breast exam shows right breast with no masses or edema or erythema. There is no right axillary adenopathy. Left breast shows  well-healed lumpectomy at the 2:00 position. She has no t obvious mass in the left breast. There is some slight contraction from radiation. She does not have any obvious left axillary adenopathy. Abdomen is soft. Has good bowel sounds. There is no palpable abdominal mass. There is no palpable liver or spleen tip. Neck exam shows no kyphosis. There is no tenderness over the spine ribs or hips. Extremities shows no lymphedema. She has no joint swelling. She's good range most of her joints. Skin exam no rashes. Neurological exam shows no focal neurological deficits.  Lab Results  Component Value Date   WBC 12.1 (H) 10/04/2016   HGB 12.7 10/04/2016   HCT 41.9 10/04/2016   MCV 80 (L) 10/04/2016   PLT 516 (H) 10/04/2016     Chemistry      Component Value Date/Time   NA 140 07/05/2016 1356   NA 139 04/10/2016 0855   K 4.1 07/05/2016 1356   K 4.2 04/10/2016 0855   CL 104 07/05/2016 1356   CL 101 06/25/2013 1356   CO2 29 07/05/2016 1356   CO2 29 04/10/2016 0855   BUN 7 (L) 07/05/2016 1356   BUN 10.0 04/10/2016 0855   CREATININE 0.91 07/05/2016 1356   CREATININE 0.8 04/10/2016 0855      Component Value Date/Time   CALCIUM 9.7 07/05/2016 1356   CALCIUM 9.5 04/10/2016 0855   ALKPHOS 88 07/05/2016 1356   ALKPHOS 85 04/10/2016 0855   AST 17 07/05/2016 1356   AST 19 04/10/2016 0855   ALT 8 07/05/2016 1356   ALT 13 04/10/2016 0855   BILITOT 0.9 07/05/2016 1356   BILITOT 1.26 (H) 04/10/2016 0855         Impression and Plan: [name] is a 83 year old white female with 2 separate problems.   We will have to increase her Hydrea dose to 500 mg daily. Her platelet count is just too high.  She does not need to be phlebotomized.  As far as he breast cancer is concerned, this is not an issue. She completed Femara back in April 2017.  She sees her orthopedist tomorrow so he will be able to help with all of her bony and muscle pain that she had from her fall.  I need to see her back in  6 weeks. Since we are making a dosage adjustment with the Hydrea, we really need to see her more frequently.  Volanda Napoleon, MD 5/24/20183:54 PM

## 2016-10-11 DIAGNOSIS — M25562 Pain in left knee: Secondary | ICD-10-CM | POA: Diagnosis not present

## 2016-10-15 DIAGNOSIS — M25552 Pain in left hip: Secondary | ICD-10-CM | POA: Diagnosis not present

## 2016-10-15 DIAGNOSIS — M7062 Trochanteric bursitis, left hip: Secondary | ICD-10-CM | POA: Diagnosis not present

## 2016-10-17 ENCOUNTER — Other Ambulatory Visit: Payer: Self-pay | Admitting: Family Medicine

## 2016-10-17 DIAGNOSIS — M25552 Pain in left hip: Secondary | ICD-10-CM

## 2016-10-17 DIAGNOSIS — M545 Low back pain: Secondary | ICD-10-CM

## 2016-10-18 DIAGNOSIS — M25552 Pain in left hip: Secondary | ICD-10-CM | POA: Diagnosis not present

## 2016-10-18 DIAGNOSIS — M7062 Trochanteric bursitis, left hip: Secondary | ICD-10-CM | POA: Diagnosis not present

## 2016-10-19 IMAGING — US US EXTREM LOW VENOUS*L*
1 series · 13 of 24 positions shown · non-contrast
Comparison: January 12, 2012.

CLINICAL DATA: Left lower leg pain.



[Series 1: us extrem low venous*left* · 0.08mm/px · 13 of 38 slices shown]
[im 1/38]
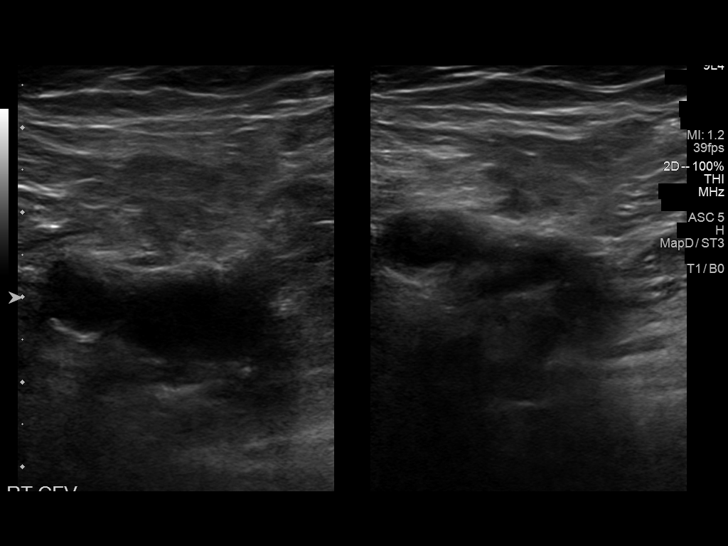
[im 4/38]
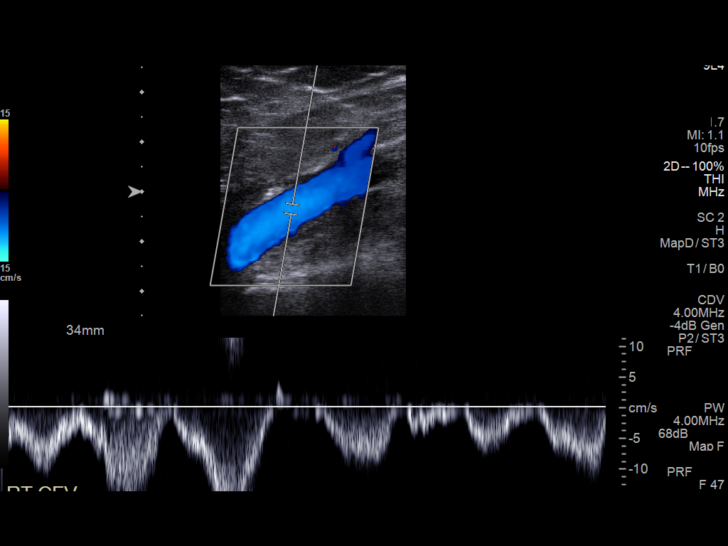
[im 7/38]
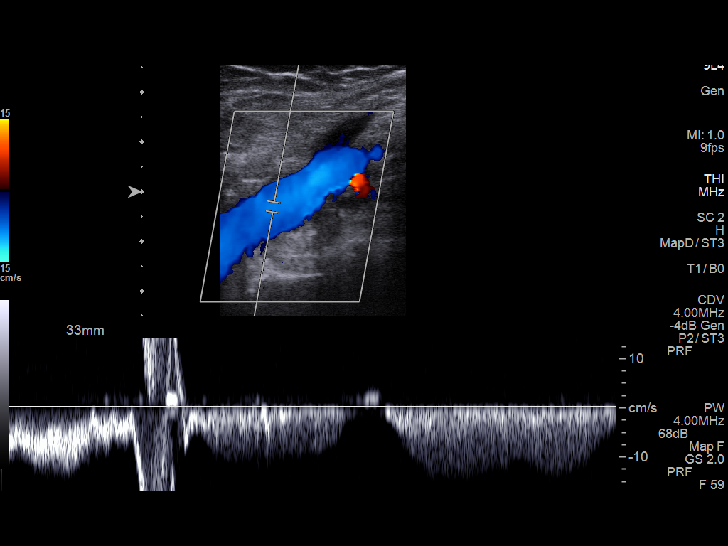
[im 10/38]
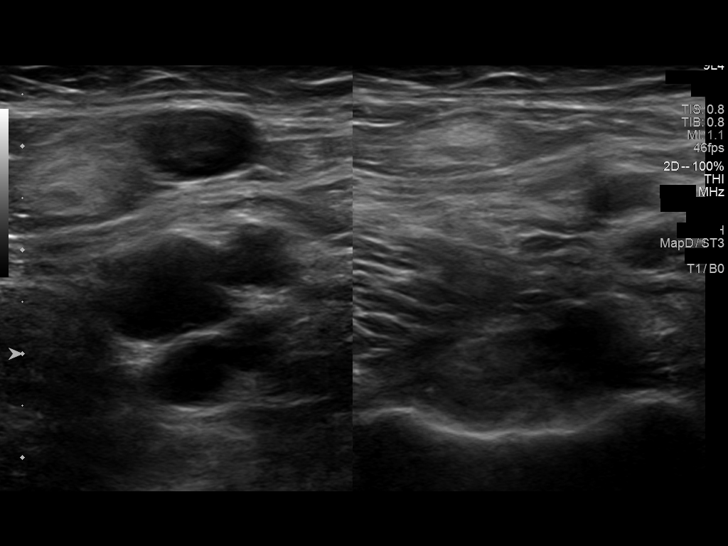
[im 13/38]
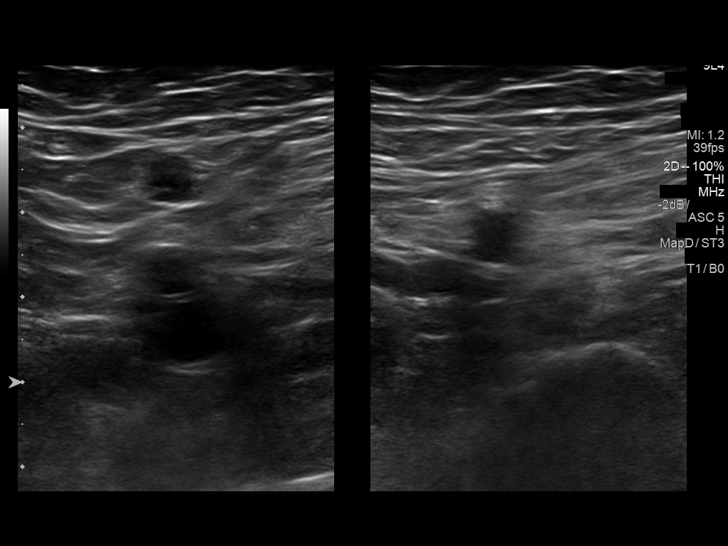
[im 17/38]
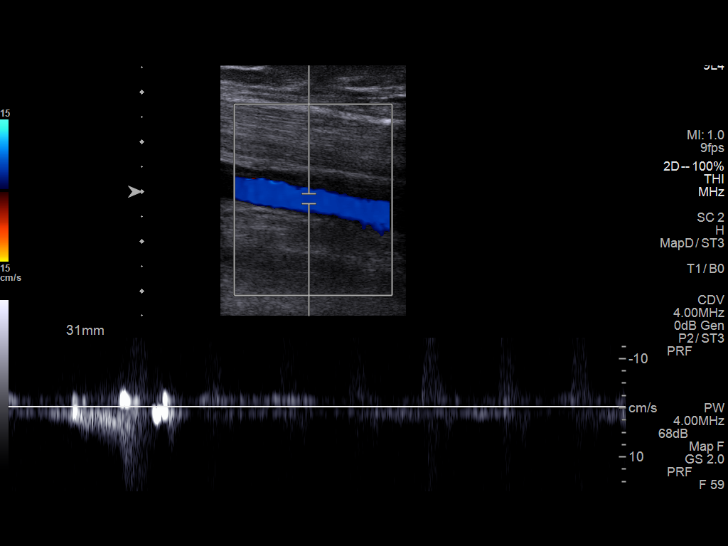
[im 20/38]
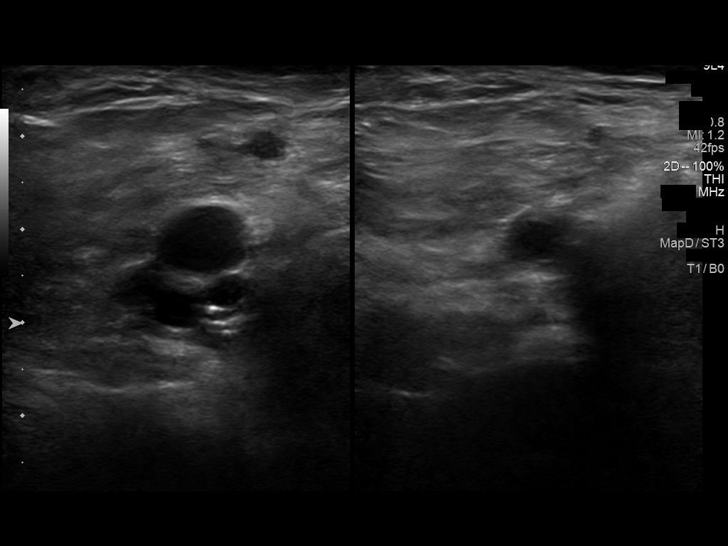
[im 21/38]
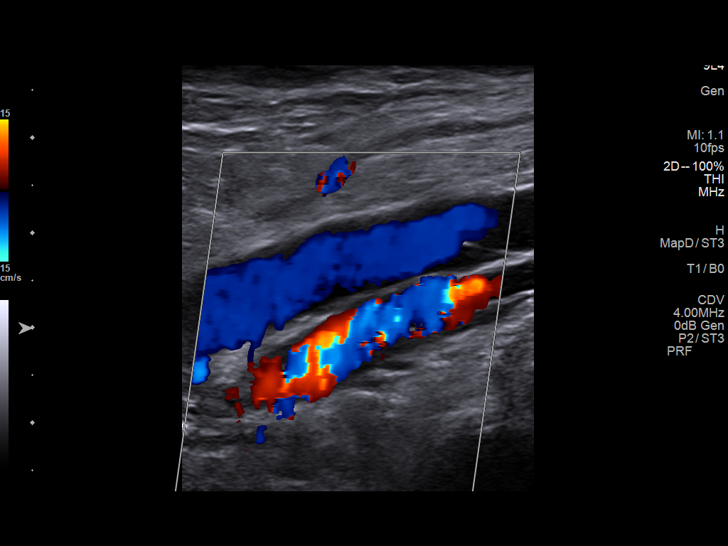
[im 25/38]
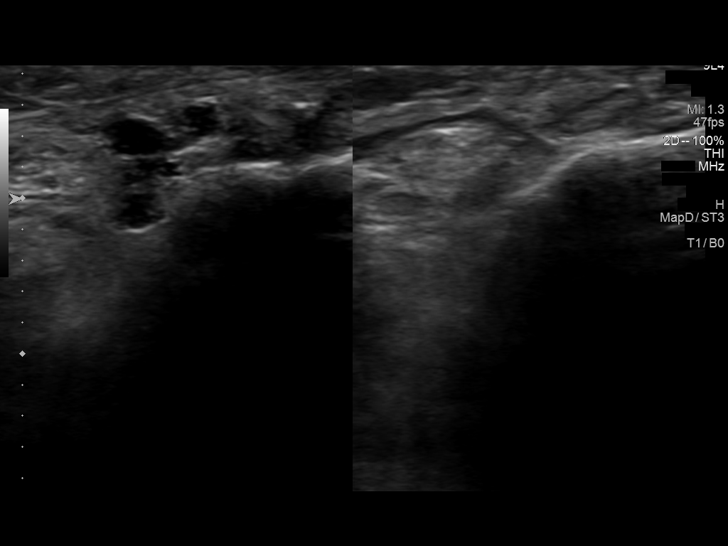
[im 28/38]
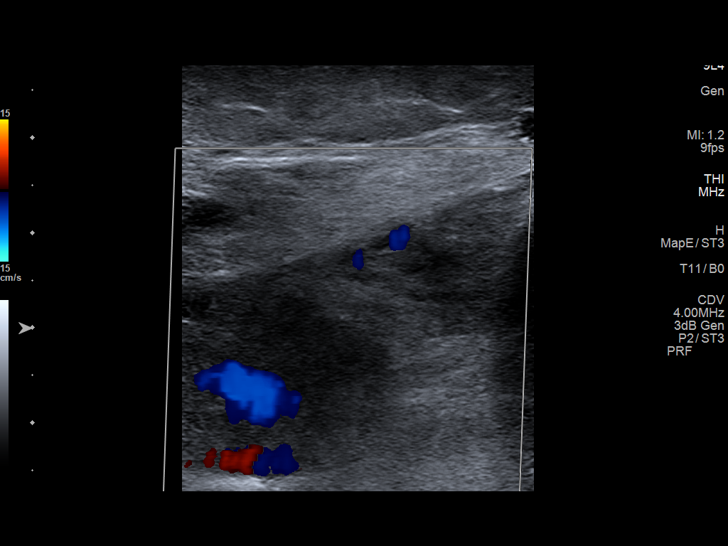
[im 31/38]
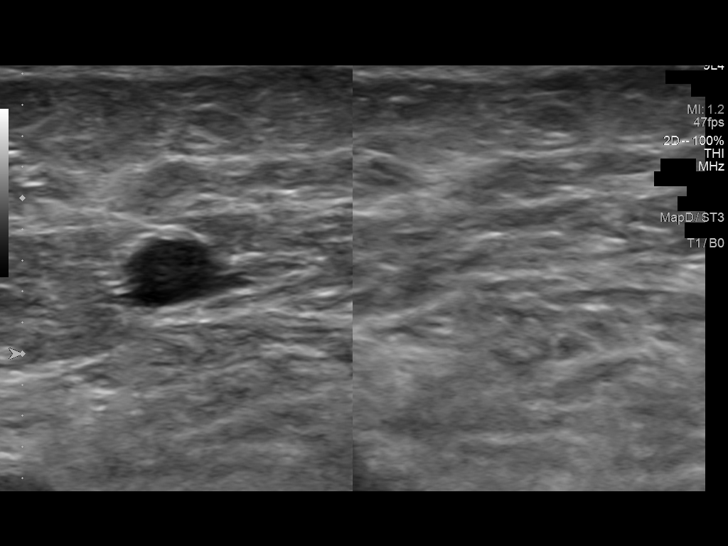
[im 34/38]
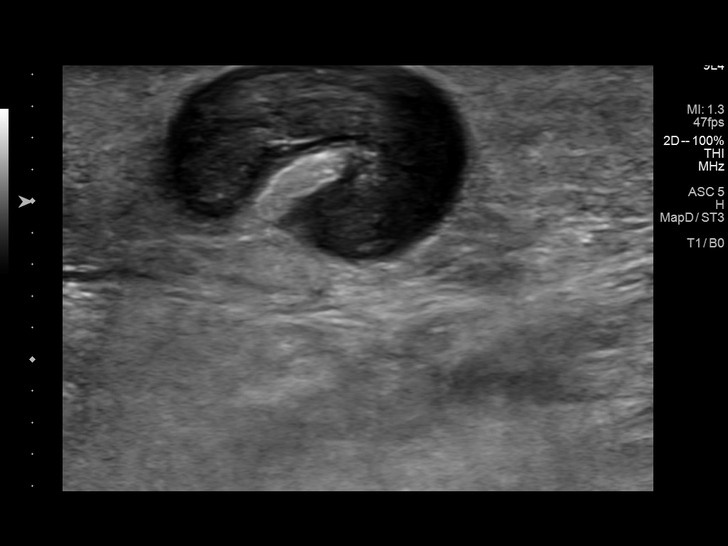
[im 38/38]
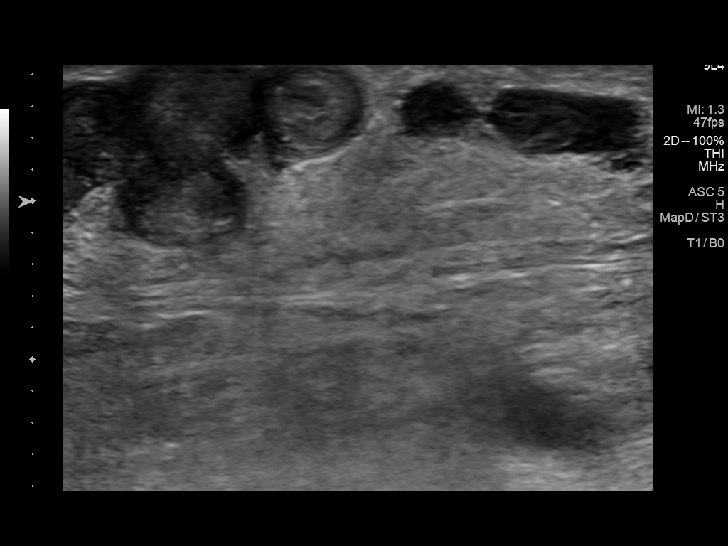

[13 of 24 positions shown; findings below may reference images not displayed]

FINDINGS: Contralateral Common Femoral Vein: Respiratory phasicity is normal
and symmetric with the symptomatic side. No evidence of thrombus.
Normal compressibility.

Common Femoral Vein: No evidence of thrombus. Normal
compressibility, respiratory phasicity and response to augmentation.

Saphenofemoral Junction: No evidence of thrombus. Normal
compressibility and flow on color Doppler imaging.

Profunda Femoral Vein: No evidence of thrombus. Normal
compressibility and flow on color Doppler imaging.

Femoral Vein: No evidence of thrombus. Normal compressibility,
respiratory phasicity and response to augmentation.

Popliteal Vein: No evidence of thrombus. Normal compressibility,
respiratory phasicity and response to augmentation.

Calf Veins: No evidence of thrombus. Normal compressibility and flow
on color Doppler imaging.

Superficial Great Saphenous Vein: No evidence of thrombus. Normal
compressibility and flow on color Doppler imaging.

Venous Reflux:  None.

Other Findings: There is a large varicosity seen in the medial
portion of the left calf which is noncompressible and contains
thrombus.
IMPRESSION: No evidence of deep venous thrombosis seen in left lower extremity.
Thrombosed varicosity seen in the medial portion of the calf which
may represent superficial thrombophlebitis.

## 2016-10-22 DIAGNOSIS — M25552 Pain in left hip: Secondary | ICD-10-CM | POA: Diagnosis not present

## 2016-10-22 DIAGNOSIS — M7062 Trochanteric bursitis, left hip: Secondary | ICD-10-CM | POA: Diagnosis not present

## 2016-10-25 DIAGNOSIS — M25552 Pain in left hip: Secondary | ICD-10-CM | POA: Diagnosis not present

## 2016-10-25 DIAGNOSIS — M7062 Trochanteric bursitis, left hip: Secondary | ICD-10-CM | POA: Diagnosis not present

## 2016-10-27 ENCOUNTER — Ambulatory Visit
Admission: RE | Admit: 2016-10-27 | Discharge: 2016-10-27 | Disposition: A | Discharge status: Home / Self Care | Payer: Medicare Other | Source: Ambulatory Visit | Attending: Family Medicine | Admitting: Family Medicine

## 2016-10-27 DIAGNOSIS — M545 Low back pain: Secondary | ICD-10-CM

## 2016-10-27 DIAGNOSIS — S32512A Fracture of superior rim of left pubis, initial encounter for closed fracture: Secondary | ICD-10-CM | POA: Diagnosis not present

## 2016-10-27 DIAGNOSIS — S3210XA Unspecified fracture of sacrum, initial encounter for closed fracture: Secondary | ICD-10-CM | POA: Diagnosis not present

## 2016-10-27 DIAGNOSIS — M25552 Pain in left hip: Secondary | ICD-10-CM

## 2016-10-27 DIAGNOSIS — M5136 Other intervertebral disc degeneration, lumbar region: Secondary | ICD-10-CM | POA: Diagnosis not present

## 2016-10-29 DIAGNOSIS — M25552 Pain in left hip: Secondary | ICD-10-CM | POA: Diagnosis not present

## 2016-11-01 DIAGNOSIS — M25552 Pain in left hip: Secondary | ICD-10-CM | POA: Diagnosis not present

## 2016-11-01 DIAGNOSIS — M7062 Trochanteric bursitis, left hip: Secondary | ICD-10-CM | POA: Diagnosis not present

## 2016-11-12 DIAGNOSIS — M7062 Trochanteric bursitis, left hip: Secondary | ICD-10-CM | POA: Diagnosis not present

## 2016-11-12 DIAGNOSIS — M25552 Pain in left hip: Secondary | ICD-10-CM | POA: Diagnosis not present

## 2016-11-16 ENCOUNTER — Ambulatory Visit (HOSPITAL_BASED_OUTPATIENT_CLINIC_OR_DEPARTMENT_OTHER): Payer: Medicare Other | Admitting: Hematology & Oncology

## 2016-11-16 ENCOUNTER — Other Ambulatory Visit (HOSPITAL_BASED_OUTPATIENT_CLINIC_OR_DEPARTMENT_OTHER): Payer: Medicare Other

## 2016-11-16 VITALS — BP 155/49 | HR 62 | Temp 98.2°F | Resp 19 | Wt 156.0 lb

## 2016-11-16 DIAGNOSIS — C50012 Malignant neoplasm of nipple and areola, left female breast: Secondary | ICD-10-CM

## 2016-11-16 DIAGNOSIS — D751 Secondary polycythemia: Secondary | ICD-10-CM

## 2016-11-16 DIAGNOSIS — Z171 Estrogen receptor negative status [ER-]: Secondary | ICD-10-CM

## 2016-11-16 DIAGNOSIS — Z853 Personal history of malignant neoplasm of breast: Secondary | ICD-10-CM | POA: Diagnosis not present

## 2016-11-16 DIAGNOSIS — D45 Polycythemia vera: Secondary | ICD-10-CM | POA: Diagnosis not present

## 2016-11-16 DIAGNOSIS — D500 Iron deficiency anemia secondary to blood loss (chronic): Secondary | ICD-10-CM

## 2016-11-16 LAB — CBC WITH DIFFERENTIAL (CANCER CENTER ONLY)
BASO#: 0 10*3/uL (ref 0.0–0.2)
BASO%: 0.4 % (ref 0.0–2.0)
EOS%: 1.4 % (ref 0.0–7.0)
Eosinophils Absolute: 0.1 10*3/uL (ref 0.0–0.5)
HCT: 43 % (ref 34.8–46.6)
HGB: 13.1 g/dL (ref 11.6–15.9)
LYMPH#: 1.4 10*3/uL (ref 0.9–3.3)
LYMPH%: 19.2 % (ref 14.0–48.0)
MCH: 25.3 pg — ABNORMAL LOW (ref 26.0–34.0)
MCHC: 30.5 g/dL — ABNORMAL LOW (ref 32.0–36.0)
MCV: 83 fL (ref 81–101)
MONO#: 0.7 10*3/uL (ref 0.1–0.9)
MONO%: 9.9 % (ref 0.0–13.0)
NEUT#: 4.9 10*3/uL (ref 1.5–6.5)
NEUT%: 69.1 % (ref 39.6–80.0)
Platelets: 342 10*3/uL (ref 145–400)
RBC: 5.17 10*6/uL (ref 3.70–5.32)
RDW: 19 % — ABNORMAL HIGH (ref 11.1–15.7)
WBC: 7.1 10*3/uL (ref 3.9–10.0)

## 2016-11-16 LAB — CMP (CANCER CENTER ONLY)
ALT(SGPT): 21 U/L (ref 10–47)
AST: 24 U/L (ref 11–38)
Albumin: 3.7 g/dL (ref 3.3–5.5)
Alkaline Phosphatase: 99 U/L — ABNORMAL HIGH (ref 26–84)
BUN, Bld: 7 mg/dL (ref 7–22)
CO2: 32 mEq/L (ref 18–33)
Calcium: 9.2 mg/dL (ref 8.0–10.3)
Chloride: 98 mEq/L (ref 98–108)
Creat: 0.5 mg/dl — ABNORMAL LOW (ref 0.6–1.2)
Glucose, Bld: 121 mg/dL — ABNORMAL HIGH (ref 73–118)
Potassium: 4 mEq/L (ref 3.3–4.7)
Sodium: 138 mEq/L (ref 128–145)
Total Bilirubin: 1.5 mg/dl (ref 0.20–1.60)
Total Protein: 6.9 g/dL (ref 6.4–8.1)

## 2016-11-16 NOTE — Progress Notes (Signed)
Hematology and Oncology Follow Up Visit  VAIL BASISTA [id number] 14-May-1930 83 y.o. 11/16/2016   Principle Diagnosis:   Polycythemia vera-JAK2 positive  Stage I (T1bN0M0 ) infiltrating duct carcinoma the left breast   Current Therapy:    #1 phlebotomy to maintain hematocrit below 45%.  #2 Hydrea 500 mg by mouth every day.  #3 Femara 2.5 mg by mouth daily - stopped 08/2015     Interim History:  Ms.  Tolle is back for followup. She is doing okay. We increased the dose of Hydrea to every day. We did this because of some slight leukocytosis and thrombocytosis. This seems to have worked quite nicely.  She's had no proms with nausea or vomiting. There has been no problems with cough. She's had no headache. She's had no change in bowel or bladder habits. She's had no rashes.  Currently, her performance status is ECOG 2.   Medications:  Current Outpatient Prescriptions:  .  acetaminophen-codeine (TYLENOL #3) 300-30 MG tablet, Take 1 tablet by mouth every 8 (eight) hours as needed. For migraine, Disp: 60 tablet, Rfl: 0 .  aspirin 81 MG tablet, Take 81 mg by mouth daily., Disp: , Rfl:  .  aspirin-acetaminophen-caffeine (EXCEDRIN MIGRAINE) 250-250-65 MG tablet, Take 1 tablet by mouth every 6 (six) hours as needed for headache., Disp: , Rfl:  .  hydrochlorothiazide (HYDRODIURIL) 25 MG tablet, Take 1 tablet (25 mg total) by mouth daily as needed (swelling in the legs )., Disp: 90 tablet, Rfl: 0 .  hydroxyurea (HYDREA) 500 MG capsule, Take 1 capsule (500 mg total) by mouth daily. May take with food to minimize GI side effects., Disp: 30 capsule, Rfl: 12 .  LORazepam (ATIVAN) 0.5 MG tablet, Take 1 tablet by mouth every morning and 2 tablets by mouth at bedtime as needed, Disp: 90 tablet, Rfl: 1 .  metoprolol (LOPRESSOR) 50 MG tablet, Take 1 tablet (50 mg total) by mouth 2 (two) times daily., Disp: 180 tablet, Rfl: 1 .  omeprazole (PRILOSEC) 20 MG capsule, Take 20 mg by mouth daily., Disp:  , Rfl:   Allergies:  Allergies  Allergen Reactions  . Penicillins Other (See Comments), Rash and Hives    Other Reaction: OTHER REACTION Other Reaction: OTHER REACTION Mouth blisters 03-29-11  . Atorvastatin Diarrhea    REACTION: diarrhe  . Ezetimibe     REACTION: nausea  . Fluvastatin Sodium Other (See Comments)    "Can't remember"  . Magnesium Hydroxide Other (See Comments)    REACTION: triggers HAs  . Meloxicam Other (See Comments)    Pt seeing auro's / spots.    . Omeprazole Magnesium     auras  . Pneumovax [Pneumococcal Polysaccharide Vaccine]     Local reaction  . Quinapril Hcl Other (See Comments)    03-29-11 "feelings of tiredness"  . Simvastatin Diarrhea  . Topamax [Topiramate] Itching  . Valsartan Itching and Other (See Comments)    REACTION: toes itch  . Vit D-Vit E-Safflower Oil Other (See Comments)    Headaches  . Carvedilol     "teeth hurt when I took it"    Past Medical History, Surgical history, Social history, and Family History were reviewed and updated.  Review of Systems: As above  Physical Exam:  weight is 156 lb (70.8 kg). Her oral temperature is 98.2 F (36.8 C). Her blood pressure is 155/49 (abnormal) and her pulse is 62. Her respiration is 19 and oxygen saturation is 100%.   Well-developed and well-nourished white female. Head and  neck exam shows no ocular or oral lesions.She does have a healing vesicular macular-type lesion just left of the midline between the lip and nares. She has no palpable cervical or supraclavicular lymph nodes. Lungs are clear bilaterally. Cardiac exam regular rate and rhythm with a normal S1 and S2. There are no murmurs rubs or bruits. Breast exam shows right breast with no masses or edema or erythema. There is no right axillary adenopathy. Left breast shows well-healed lumpectomy at the 2:00 position. She has no t obvious mass in the left breast. There is some slight contraction from radiation. She does not have any  obvious left axillary adenopathy. Abdomen is soft. Has good bowel sounds. There is no palpable abdominal mass. There is no palpable liver or spleen tip. Neck exam shows no kyphosis. There is no tenderness over the spine ribs or hips. Extremities shows no lymphedema. She has no joint swelling. She's good range most of her joints. Skin exam no rashes. Neurological exam shows no focal neurological deficits.  Lab Results  Component Value Date   WBC 7.1 11/16/2016   HGB 13.1 11/16/2016   HCT 43.0 11/16/2016   MCV 83 11/16/2016   PLT 342 11/16/2016     Chemistry      Component Value Date/Time   NA 138 11/16/2016 1346   NA 139 04/10/2016 0855   K 4.0 11/16/2016 1346   K 4.2 04/10/2016 0855   CL 98 11/16/2016 1346   CO2 32 11/16/2016 1346   CO2 29 04/10/2016 0855   BUN 7 11/16/2016 1346   BUN 10.0 04/10/2016 0855   CREATININE 0.5 (L) 11/16/2016 1346   CREATININE 0.8 04/10/2016 0855      Component Value Date/Time   CALCIUM 9.2 11/16/2016 1346   CALCIUM 9.5 04/10/2016 0855   ALKPHOS 99 (H) 11/16/2016 1346   ALKPHOS 85 04/10/2016 0855   AST 24 11/16/2016 1346   AST 19 04/10/2016 0855   ALT 21 11/16/2016 1346   ALT 13 04/10/2016 0855   BILITOT 1.50 11/16/2016 1346   BILITOT 1.26 (H) 04/10/2016 0855         Impression and Plan: Ms. Reen is a 83 year old white female with 2 separate problems.   The increased dose of Hydrea certainly has helped.  She does not need to be phlebotomized.  We will plan to get her back in 2 months now. We get her back after Memorial Day.   Volanda Napoleon, MD 7/6/20183:05 PM

## 2016-11-19 LAB — IRON AND TIBC
%SAT: 8 % — ABNORMAL LOW (ref 21–57)
Iron: 35 ug/dL — ABNORMAL LOW (ref 41–142)
TIBC: 424 ug/dL (ref 236–444)
UIBC: 389 ug/dL — ABNORMAL HIGH (ref 120–384)

## 2016-11-19 LAB — FERRITIN: Ferritin: 35 ng/ml (ref 9–269)

## 2016-11-21 ENCOUNTER — Ambulatory Visit
Admission: RE | Admit: 2016-11-21 | Discharge: 2016-11-21 | Disposition: A | Discharge status: Home / Self Care | Payer: Medicare Other | Source: Ambulatory Visit | Attending: Hematology & Oncology | Admitting: Hematology & Oncology

## 2016-11-21 DIAGNOSIS — Z853 Personal history of malignant neoplasm of breast: Secondary | ICD-10-CM

## 2016-11-21 DIAGNOSIS — R922 Inconclusive mammogram: Secondary | ICD-10-CM | POA: Diagnosis not present

## 2016-11-29 ENCOUNTER — Telehealth: Payer: Self-pay

## 2016-11-29 NOTE — Telephone Encounter (Addendum)
Pt is requesting refill on lorazepam 0.5mg  tabs.  Last OV: 07/17/2016, appt scheduled 12/18/2016 Last Fill: 09/21/2016 #90 and 1RF Pt sig: 1 tab in AM and 2 tabs in PM PRN UDS: 06/01/2015 Low risk  [place] Controlled Substance Database printed;   Tramadol 50mg  tabs #30 regularly refilled by Dr. Tania Ade and Dr. Rhina Brackett at [facility] #3 tabs filled on 09/18/2016, and 07/13/2016 with PCP DEA number, but medication is not on medication list. Per medication history, Rx last written on 07/05/2016 #60 and 0RF (per [place] database, prescription filled on 07/13/2016) #60 and 0RF; no other issues noted.   Please advise.

## 2016-11-30 MED ORDER — LORAZEPAM 0.5 MG PO TABS: ORAL_TABLET | ORAL | 1 refills | Status: DC

## 2016-11-30 NOTE — Telephone Encounter (Signed)
Okay to refill Ativan 90 and 1. I prescribed Tylenol No. 3 as sporadically for pain. She is also getting tramadol from 2 other prescribers   ( from the same office though). Advise patient she needs to make her mind about pain medication, with the new laws I don't recommend to get pain  medications from 2 sources.

## 2016-11-30 NOTE — Telephone Encounter (Signed)
Tried calling Pt, no answer, unable to leave message.  °

## 2016-11-30 NOTE — Telephone Encounter (Signed)
Rx printed, awaiting MD signature.  

## 2016-11-30 NOTE — Telephone Encounter (Signed)
Rx faxed to [facility] pharmacy.  

## 2016-12-01 DIAGNOSIS — J209 Acute bronchitis, unspecified: Secondary | ICD-10-CM | POA: Diagnosis not present

## 2016-12-05 ENCOUNTER — Ambulatory Visit (INDEPENDENT_AMBULATORY_CARE_PROVIDER_SITE_OTHER): Payer: Medicare Other | Admitting: Internal Medicine

## 2016-12-05 ENCOUNTER — Encounter: Payer: Self-pay | Admitting: Internal Medicine

## 2016-12-05 ENCOUNTER — Ambulatory Visit (HOSPITAL_BASED_OUTPATIENT_CLINIC_OR_DEPARTMENT_OTHER)
Admission: RE | Admit: 2016-12-05 | Discharge: 2016-12-05 | Disposition: A | Discharge status: Home / Self Care | Payer: Medicare Other | Source: Ambulatory Visit | Attending: Internal Medicine | Admitting: Internal Medicine

## 2016-12-05 VITALS — BP 151/65 | HR 93 | Temp 98.6°F | Ht 65.0 in | Wt 155.1 lb

## 2016-12-05 DIAGNOSIS — R059 Cough, unspecified: Secondary | ICD-10-CM

## 2016-12-05 DIAGNOSIS — K449 Diaphragmatic hernia without obstruction or gangrene: Secondary | ICD-10-CM | POA: Diagnosis not present

## 2016-12-05 DIAGNOSIS — F341 Dysthymic disorder: Secondary | ICD-10-CM

## 2016-12-05 DIAGNOSIS — R05 Cough: Secondary | ICD-10-CM

## 2016-12-05 DIAGNOSIS — R0602 Shortness of breath: Secondary | ICD-10-CM | POA: Diagnosis not present

## 2016-12-05 DIAGNOSIS — M4856XA Collapsed vertebra, not elsewhere classified, lumbar region, initial encounter for fracture: Secondary | ICD-10-CM | POA: Diagnosis not present

## 2016-12-05 MED ORDER — BENZONATATE 100 MG PO CAPS: 100.0000 mg | ORAL_CAPSULE | Freq: Three times a day (TID) | ORAL | 0 refills | Status: DC | PRN

## 2016-12-05 MED ORDER — ACETAMINOPHEN-CODEINE #3 300-30 MG PO TABS: 1.0000 | ORAL_TABLET | Freq: Three times a day (TID) | ORAL | 0 refills | Status: DC | PRN

## 2016-12-05 NOTE — Progress Notes (Signed)
Pre visit review using our clinic review tool, if applicable. No additional management support is needed unless otherwise documented below in the visit note. 

## 2016-12-05 NOTE — Patient Instructions (Addendum)
Get a chest x-ray in the first floor  Rest, fluids , tylenol  For cough:  Take Robitussin over-the-counter as needed Also take Tessalon Perles as needed. codeine can help the cough, take Tylenol with codeine one or twice a day as needed for cough. Watch for somnolence.   For nasal congestion: Use OTC   Flonase : 2 nasal sprays on each side of the nose in the morning until you feel better   Call if not gradually better over the next  10 days  Call anytime if the symptoms are severe

## 2016-12-05 NOTE — Progress Notes (Signed)
Subjective:    Patient ID: [name], female    DOB: Jun 08, 1929, 83 y.o.   MRN: [id number]  DOS:  12/05/2016 Type of visit - description : acute Interval history: Symptoms started approximately 6 days ago, cough, clear sputum, sore throat. ST sometimes intense and she felt like the throat was swollen. Went to urgent care approximately 12/01/2016, prescribed a Z-Pak. Doing about the same. Her daughter is also sick but she is getting better   Review of Systems  No fever or chills. No sinus pain or congestion. Mild anterior chest pain with cough only. Mild shortness of breath. No lower extremity edema no hemoptysis.  Past Medical History:  Diagnosis Date  . CAD (coronary artery disease)    mild nonobstructive disease on cath in 2003  . Cancer ([place])   . Cardiomyopathy    Probable Takotsubo, severe CP w/ normal cath in 1994. Severe CP in 2003 w/ widespread T wave inversions on ECG. Cath w/ minimal coronary disease but LV-gram showed periapical severe hypokinesis and basilar hyperkinesia (EF 40%). Last echo in 4/09 confirmed full LV functional recovery with EF 60%, no regional wall motion abnormalities, mild to moderate LVH.  [place] Kitchen CVA (cerebral infarction)   . Depression with anxiety 03-29-11   lost husband 3'09  . Diverticulitis   . DVT (deep venous thrombosis) ([place])    after venous ablation, R leg  . E. coli gastroenteritis 03-29-11   8'10  . Fracture 09/10/07   L2, status post vertebroplasty of L2 performed by IR  . GERD (gastroesophageal reflux disease)   . Headache(784.0)    migraines  . Hemorrhoids   . Hiatal hernia 03-29-11   no nerve problems  . Hyperlipidemia   . Hyperplastic colon polyp 06/2001  . Hypertension   . OA (osteoarthritis) of knee 03-29-11   w/ bilateral knee pain-not a problem now  . OSA (obstructive sleep apnea) 03-29-11   no cpap used, not a problem now.  . Osteoporosis   . Otalgia of both ears    Dr. Simeon Craft  . Polycythemia   . Stroke [facility])  03-29-11   CVA x2 -last 10'12-?TIA(visual problems)  . Varicose vein     Past Surgical History:  Procedure Laterality Date  . APPENDECTOMY    . BACK SURGERY    . BREAST BIOPSY    . BREAST CYST EXCISION    . BREAST LUMPECTOMY Left 2013   left stage I left breast cancer  . CHOLECYSTECTOMY  04/09/2011   Procedure: LAPAROSCOPIC CHOLECYSTECTOMY WITH INTRAOPERATIVE CHOLANGIOGRAM;  Surgeon: Odis Hollingshead, MD;  Location: [facility] ORS;  Service: General;  Laterality: N/A;  . Dental Extraction     L maxillary molar  . KYPHOSIS SURGERY  08/2007   cement used  . OVARIAN CYST SURGERY     left  . TONSILLECTOMY    . TOTAL KNEE ARTHROPLASTY  06/2010   left  . TUBAL LIGATION      Social History   Social History  . Marital status: Widowed    Spouse name: N/A  . Number of children: 3  . Years of education: N/A   Occupational History  . n/a    Social History Main Topics  . Smoking status: Former Smoker    Packs/day: 1.00    Years: 37.00    Types: Cigarettes    Start date: 06/26/1951    Quit date: 05/14/1988  . Smokeless tobacco: Never Used     Comment: quit 25 years ago  . Alcohol  use 0.6 oz/week    1 Cans of beer per week     Comment: occasional/social  . Drug use: No  . Sexual activity: No   Other Topics Concern  . Not on file   Social History Narrative   Lost husband 07/29/07-    Lives in [place] in a town house by herself,daughter moved out ~ 03-2015, has 2 daughters in town   Still drives                   Allergies as of 12/05/2016      Reactions   Penicillins Other (See Comments), Rash, Hives   Other Reaction: OTHER REACTION Other Reaction: OTHER REACTION Mouth blisters 03-29-11   Atorvastatin Diarrhea   REACTION: diarrhe   Ezetimibe    REACTION: nausea   Fluvastatin Sodium Other (See Comments)   "Can't remember"   Magnesium Hydroxide Other (See Comments)   REACTION: triggers HAs   Meloxicam Other (See Comments)   Pt seeing auro's / spots.      Omeprazole Magnesium    auras   Pneumovax [pneumococcal Polysaccharide Vaccine]    Local reaction   Quinapril Hcl Other (See Comments)   03-29-11 "feelings of tiredness"   Simvastatin Diarrhea   Topamax [topiramate] Itching   Valsartan Itching, Other (See Comments)   REACTION: toes itch   Vit D-vit E-safflower Oil Other (See Comments)   Headaches   Carvedilol    "teeth hurt when I took it"      Medication List       Accurate as of 12/05/16 11:59 PM. Always use your most recent med list.          acetaminophen-codeine 300-30 MG tablet Commonly known as:  TYLENOL #3 Take 1 tablet by mouth every 8 (eight) hours as needed. For migraine   aspirin 81 MG tablet Take 81 mg by mouth daily.   aspirin-acetaminophen-caffeine 250-250-65 MG tablet Commonly known as:  EXCEDRIN MIGRAINE Take 1 tablet by mouth every 6 (six) hours as needed for headache.   benzonatate 100 MG capsule Commonly known as:  TESSALON Take 1 capsule (100 mg total) by mouth 3 (three) times daily as needed for cough.   hydrochlorothiazide 25 MG tablet Commonly known as:  HYDRODIURIL Take 1 tablet (25 mg total) by mouth daily as needed (swelling in the legs ).   hydroxyurea 500 MG capsule Commonly known as:  HYDREA Take 1 capsule (500 mg total) by mouth daily. May take with food to minimize GI side effects.   LORazepam 0.5 MG tablet Commonly known as:  ATIVAN Take 1 tablet by mouth every morning and 2 tablets by mouth at bedtime as needed   metoprolol tartrate 50 MG tablet Commonly known as:  LOPRESSOR Take 1 tablet (50 mg total) by mouth 2 (two) times daily.   omeprazole 20 MG capsule Commonly known as:  PRILOSEC Take 20 mg by mouth daily.          Objective:   Physical Exam BP (!) 151/65 (BP Location: Left Arm, Patient Position: Sitting, Cuff Size: Small)   Pulse 93   Temp 98.6 F (37 C) (Oral)   Ht 5\' 5"  (1.651 m)   Wt 155 lb 2 oz (70.4 kg)   SpO2 100%   BMI 25.81 kg/m  General:     Well developed, elderly lady, looks tired but not toxic appearing HEENT:  Normocephalic . Face symmetric, atraumatic. Nose is slightly congested, TMs normal, throat: Uvula midline, no  swollen. No redness, discharge. Lungs:  Decreased breath sounds otherwise clear. Normal respiratory effort, no intercostal retractions, no accessory muscle use. Heart: RRR,  no murmur.  No pretibial edema bilaterally  Skin: Not pale. Not jaundice Neurologic:  alert & oriented X3.  Speech normal, gait appropriate for age and unassisted Psych--  Cognition and judgment appear intact.  Cooperative with normal attention span and concentration.  Behavior appropriate. No anxious or depressed appearing.      Assessment & Plan:   Assessment  HTN Dyslipidemia: Intolerant to most medicines including Zetia, Pravachol, Niaspan Depression, anxiety , insomnia - on ativan bid (03-2015 citalopram cause drowsiness, 04-2015 prozac diarrhea) Hem-Onc: POLYCYTEMIA-- sees hem-onc H/o breast ca  MSK: ---DJD :  pain medication as needed ---Osteoporosis:  T score -2.8 (02-2013): declined treatment 04-2015 ---L2 fractures, s/p vertebral plasty 2009 Migraine headaches GI: GERD- intolerant to prevacid, visual disturbances HH, diverticulitis, Colon polyps  CV: ---Cardiomyopathy Probable Takotsubo cardiomyopathy: Severe chest pain with normal cath in 1994.  Severe chest pain in 2003 with widespread T wave inversions on ECG.  Cath with minimal coronary disease but LV-gram showed periapical severe hypokinesis and basilar hyperkinesia (EF 40%).  Last echo in 4/09 confirmed full LV functional recovery with EF 60%, mild LVH. ---Stroke / TIA 2012 Sleep apnea 2012, , not on CPAP H/oDVT, right leg after venous ablation    PLAN Cough: Having cough and sore throat x 6 days, had a Z-Pak, no feeling much better. Throat examination benign, will get a chest x-ray to be sure there is no pneumonia. Otherwise I recommend conservative  treatment. See instructions (apparently delsym caused diarrhea) Anxiety: Takes Ativan and also codeine. Due to new laws, we won't  be able to prescribe both medicines. Pt needs to think about that. Refill for Tylenol #3 provided RTC in few weeks already scheduled.

## 2016-12-06 NOTE — Assessment & Plan Note (Signed)
Cough: Having cough and sore throat x 6 days, had a Z-Pak, no feeling much better. Throat examination benign, will get a chest x-ray to be sure there is no pneumonia. Otherwise I recommend conservative treatment. See instructions (apparently delsym caused diarrhea) Anxiety: Takes Ativan and also codeine. Due to new laws, we won't  be able to prescribe both medicines. Pt needs to think about that. Refill for Tylenol #3 provided RTC in few weeks already scheduled.

## 2016-12-18 ENCOUNTER — Encounter: Payer: Self-pay | Admitting: Internal Medicine

## 2016-12-18 ENCOUNTER — Ambulatory Visit (INDEPENDENT_AMBULATORY_CARE_PROVIDER_SITE_OTHER): Payer: Medicare Other | Admitting: Internal Medicine

## 2016-12-18 VITALS — BP 134/78 | HR 69 | Temp 97.9°F | Resp 14 | Ht 65.0 in | Wt 152.5 lb

## 2016-12-18 DIAGNOSIS — M810 Age-related osteoporosis without current pathological fracture: Secondary | ICD-10-CM | POA: Diagnosis not present

## 2016-12-18 DIAGNOSIS — I10 Essential (primary) hypertension: Secondary | ICD-10-CM

## 2016-12-18 DIAGNOSIS — F341 Dysthymic disorder: Secondary | ICD-10-CM | POA: Diagnosis not present

## 2016-12-18 DIAGNOSIS — Z23 Encounter for immunization: Secondary | ICD-10-CM | POA: Diagnosis not present

## 2016-12-18 DIAGNOSIS — E785 Hyperlipidemia, unspecified: Secondary | ICD-10-CM

## 2016-12-18 NOTE — Patient Instructions (Signed)
  GO TO THE FRONT DESK Schedule your next appointment for a  routine checkup and Medicare wellness in 4-5 months

## 2016-12-18 NOTE — Progress Notes (Signed)
Pre visit review using our clinic review tool, if applicable. No additional management support is needed unless otherwise documented below in the visit note. 

## 2016-12-18 NOTE — Progress Notes (Signed)
Subjective:    Patient ID: [name], female    DOB: [birth year], 83 y.o.   MRN: [id number]  DOS:  12/18/2016 Type of visit - description : rov Interval history: No major concerns but reports some stress, has a number of issues going on w/  her daughters, see below Good compliance of medications without apparent side effects Labs reviewed: No need for blood work today    Review of Systems Denies chest pain or difficulty breathing. No lower extremity edema. No cough Sleeps well, usually 6 hours every night.   Past Medical History:  Diagnosis Date  . CAD (coronary artery disease)    mild nonobstructive disease on cath in 2003  . Cancer ([place])   . Cardiomyopathy    Probable Takotsubo, severe CP w/ normal cath in 1994. Severe CP in 2003 w/ widespread T wave inversions on ECG. Cath w/ minimal coronary disease but LV-gram showed periapical severe hypokinesis and basilar hyperkinesia (EF 40%). Last echo in 4/09 confirmed full LV functional recovery with EF 60%, no regional wall motion abnormalities, mild to moderate LVH.  Marland Kitchen CVA (cerebral infarction)   . Depression with anxiety 03-29-11   lost husband 3'09  . Diverticulitis   . DVT (deep venous thrombosis) ([place])    after venous ablation, R leg  . E. coli gastroenteritis 03-29-11   8'10  . Fracture 09/10/07   L2, status post vertebroplasty of L2 performed by IR  . GERD (gastroesophageal reflux disease)   . Headache(784.0)    migraines  . Hemorrhoids   . Hiatal hernia 03-29-11   no nerve problems  . Hyperlipidemia   . Hyperplastic colon polyp 06/2001  . Hypertension   . OA (osteoarthritis) of knee 03-29-11   w/ bilateral knee pain-not a problem now  . OSA (obstructive sleep apnea) 03-29-11   no cpap used, not a problem now.  . Osteoporosis   . Otalgia of both ears    Dr. Simeon Craft  . Polycythemia   . Stroke [facility]) 03-29-11   CVA x2 -last 10'12-?TIA(visual problems)  . Varicose vein     Past Surgical History:  Procedure  Laterality Date  . APPENDECTOMY    . BACK SURGERY    . BREAST BIOPSY    . BREAST CYST EXCISION    . BREAST LUMPECTOMY Left 2013   left stage I left breast cancer  . CHOLECYSTECTOMY  04/09/2011   Procedure: LAPAROSCOPIC CHOLECYSTECTOMY WITH INTRAOPERATIVE CHOLANGIOGRAM;  Surgeon: Odis Hollingshead, MD;  Location: [facility];  Service: General;  Laterality: N/A;  . Dental Extraction     L maxillary molar  . KYPHOSIS SURGERY  08/2007   cement used  . OVARIAN CYST SURGERY     left  . TONSILLECTOMY    . TOTAL KNEE ARTHROPLASTY  06/2010   left  . TUBAL LIGATION      Social History   Social History  . Marital status: Widowed    Spouse name: N/A  . Number of children: 3  . Years of education: N/A   Occupational History  . n/a    Social History Main Topics  . Smoking status: Former Smoker    Packs/day: 1.00    Years: 37.00    Types: Cigarettes    Start date: 06/26/1951    Quit date: 05/14/1988  . Smokeless tobacco: Never Used     Comment: quit 25 years ago  . Alcohol use 0.6 oz/week    1 Cans of beer per week  Comment: occasional/social  . Drug use: No  . Sexual activity: No   Other Topics Concern  . Not on file   Social History Narrative   Lost husband 07/29/07-    Lives in [place] in a town house by herself,daughter moved out ~ 03-2015, has 2 daughters in town   Still drives                   Allergies as of 12/18/2016      Reactions   Penicillins Other (See Comments), Rash, Hives   Other Reaction: OTHER REACTION Other Reaction: OTHER REACTION Mouth blisters 03-29-11   Atorvastatin Diarrhea   REACTION: diarrhe   Ezetimibe    REACTION: nausea   Fluvastatin Sodium Other (See Comments)   "Can't remember"   Magnesium Hydroxide Other (See Comments)   REACTION: triggers HAs   Meloxicam Other (See Comments)   Pt seeing auro's / spots.     Omeprazole Magnesium    auras   Pneumovax [pneumococcal Polysaccharide Vaccine]    Local reaction   Quinapril  Hcl Other (See Comments)   03-29-11 "feelings of tiredness"   Simvastatin Diarrhea   Topamax [topiramate] Itching   Valsartan Itching, Other (See Comments)   REACTION: toes itch   Vit D-vit E-safflower Oil Other (See Comments)   Headaches   Carvedilol    "teeth hurt when I took it"      Medication List       Accurate as of 12/18/16 11:59 PM. Always use your most recent med list.          acetaminophen-codeine 300-30 MG tablet Commonly known as:  TYLENOL #3 Take 1 tablet by mouth every 8 (eight) hours as needed. For migraine   aspirin 81 MG tablet Take 81 mg by mouth daily.   aspirin-acetaminophen-caffeine 250-250-65 MG tablet Commonly known as:  EXCEDRIN MIGRAINE Take 1 tablet by mouth every 6 (six) hours as needed for headache.   hydrochlorothiazide 25 MG tablet Commonly known as:  HYDRODIURIL Take 1 tablet (25 mg total) by mouth daily as needed (swelling in the legs ).   hydroxyurea 500 MG capsule Commonly known as:  HYDREA Take 1 capsule (500 mg total) by mouth daily. May take with food to minimize GI side effects.   LORazepam 0.5 MG tablet Commonly known as:  ATIVAN Take 1 tablet by mouth every morning and 2 tablets by mouth at bedtime as needed   metoprolol tartrate 50 MG tablet Commonly known as:  LOPRESSOR Take 1 tablet (50 mg total) by mouth 2 (two) times daily.   omeprazole 20 MG capsule Commonly known as:  PRILOSEC Take 20 mg by mouth daily.          Objective:   Physical Exam BP 134/78 (BP Location: Left Arm, Patient Position: Sitting, Cuff Size: Normal)   Pulse 69   Temp 97.9 F (36.6 C) (Oral)   Resp 14   Ht 5\' 5"  (1.651 m)   Wt 152 lb 8 oz (69.2 kg)   SpO2 96%   BMI 25.38 kg/m  General:   Well developed, well nourished . NAD.  HEENT:  Normocephalic . Face symmetric, atraumatic Lungs:  Slightly decreased breath sounds Normal respiratory effort, no intercostal retractions, no accessory muscle use. Heart: RRR,  no murmur.  No  pretibial edema bilaterally  Skin: Not pale. Not jaundice Neurologic:  alert & oriented X3.  Speech normal, gait appropriate for age and unassisted Psych--  Cognition and judgment appear intact.  Cooperative with normal attention span and concentration.  Behavior appropriate. No anxious or depressed appearing.      Assessment & Plan:  Assessment  HTN Dyslipidemia: Intolerant to most medicines including Zetia, Pravachol, Niaspan Depression, anxiety , insomnia - on ativan bid (03-2015 citalopram cause drowsiness, 04-2015 prozac diarrhea) Hem-Onc: POLYCYTEMIA-- sees hem-onc H/o breast ca  MSK: ---DJD :  pain medication as needed ---Osteoporosis:  T score -2.8 (02-2013): declined treatment 04-2015 ---L2 fractures, s/p vertebral plasty 2009 Migraine headaches GI: GERD- intolerant to prevacid, visual disturbances HH, diverticulitis, Colon polyps  CV: ---Cardiomyopathy Probable Takotsubo cardiomyopathy: Severe chest pain with normal cath in 1994.  Severe chest pain in 2003 with widespread T wave inversions on ECG.  Cath with minimal coronary disease but LV-gram showed periapical severe hypokinesis and basilar hyperkinesia (EF 40%).  Last echo in 4/09 confirmed full LV functional recovery with EF 60%, mild LVH. ---Stroke / TIA 2012 Sleep apnea 2012, , not on CPAP H/oDVT, right leg after venous ablation    PLAN HTN: controlled on hydrochlorothiazide and metoprolol. Last BMP satisfactory Hyperlipidemia: We talk about her cholesterol today, still does not decide to be treated. Depression anxiety insomnia: On Ativan, needs UDS Osteoporosis,  discussed treatment, declined again. Social issues: She moved to an ALF x 1 week, but plans to go back home, feels she does not belong there because most of the people there are disable, S/P strokes etc. That has been quite stressful, her daughters like her to stay there. She is  counseled. Primary care, Td today RTC for a routine checkup and an a  Medicare wellness in 4-5 months

## 2016-12-19 NOTE — Assessment & Plan Note (Signed)
HTN: controlled on hydrochlorothiazide and metoprolol. Last BMP satisfactory Hyperlipidemia: We talk about her cholesterol today, still does not decide to be treated. Depression anxiety insomnia: On Ativan, needs UDS Osteoporosis,  discussed treatment, declined again. Social issues: She moved to an ALF x 1 week, but plans to go back home, feels she does not belong there because most of the people there are disable, S/P strokes etc. That has been quite stressful, her daughters like her to stay there. She is  counseled. Primary care, Td today RTC for a routine checkup and an a Medicare wellness in 4-5 months

## 2016-12-27 ENCOUNTER — Other Ambulatory Visit: Payer: Self-pay | Admitting: Internal Medicine

## 2017-01-07 ENCOUNTER — Encounter: Payer: Self-pay | Admitting: Family Medicine

## 2017-01-07 ENCOUNTER — Ambulatory Visit (INDEPENDENT_AMBULATORY_CARE_PROVIDER_SITE_OTHER): Payer: Medicare Other | Admitting: Family Medicine

## 2017-01-07 VITALS — BP 120/82 | HR 74 | Temp 98.7°F | Ht 65.0 in | Wt 152.1 lb

## 2017-01-07 DIAGNOSIS — L03011 Cellulitis of right finger: Secondary | ICD-10-CM | POA: Diagnosis not present

## 2017-01-07 MED ORDER — DOXYCYCLINE HYCLATE 100 MG PO TABS: 100.0000 mg | ORAL_TABLET | Freq: Two times a day (BID) | ORAL | 0 refills | Status: AC

## 2017-01-07 MED FILL — DOXYCYCLINE HYCLATE 100 MG: 100 | 7 days supply | Qty: 14 | Fill #0

## 2017-01-07 NOTE — Patient Instructions (Signed)
Things to look out for: Fevers, spreading redness, worsening pain despite treatment, drainage/enlargement.  Take the medicine with some food.  Let us know if you need anything.

## 2017-01-07 NOTE — Progress Notes (Signed)
Pre visit review using our clinic review tool, if applicable. No additional management support is needed unless otherwise documented below in the visit note. 

## 2017-01-07 NOTE — Progress Notes (Signed)
Chief Complaint  Patient presents with  . Hand Pain    [name] is a 83 y.o. female here for a skin complaint.  Duration: 2 weeks Location: 3rd R finger Pruritic? No Painful? Yes Drainage? No New soaps/lotions/topicals/detergents? No Other associated symptoms: Some redness of skin around the nail, some darkening of skin under nail Therapies tried thus far: soaking finger  ROS:  Const: No fevers Skin: As noted in HPI  Past Medical History:  Diagnosis Date  . CAD (coronary artery disease)    mild nonobstructive disease on cath in 2003  . Cancer ([place])   . Cardiomyopathy    Probable Takotsubo, severe CP w/ normal cath in 1994. Severe CP in 2003 w/ widespread T wave inversions on ECG. Cath w/ minimal coronary disease but LV-gram showed periapical severe hypokinesis and basilar hyperkinesia (EF 40%). Last echo in 4/09 confirmed full LV functional recovery with EF 60%, no regional wall motion abnormalities, mild to moderate LVH.  Marland Kitchen CVA (cerebral infarction)   . Depression with anxiety 03-29-11   lost husband 3'09  . Diverticulitis   . DVT (deep venous thrombosis) ([place])    after venous ablation, R leg  . E. coli gastroenteritis 03-29-11   8'10  . Fracture 09/10/07   L2, status post vertebroplasty of L2 performed by IR  . GERD (gastroesophageal reflux disease)   . Headache(784.0)    migraines  . Hemorrhoids   . Hiatal hernia 03-29-11   no nerve problems  . Hyperlipidemia   . Hyperplastic colon polyp 06/2001  . Hypertension   . OA (osteoarthritis) of knee 03-29-11   w/ bilateral knee pain-not a problem now  . OSA (obstructive sleep apnea) 03-29-11   no cpap used, not a problem now.  . Osteoporosis   . Otalgia of both ears    Dr. Simeon Craft  . Polycythemia   . Stroke [facility]) 03-29-11   CVA x2 -last 10'12-?TIA(visual problems)  . Varicose vein    Allergies  Allergen Reactions  . Penicillins Other (See Comments), Rash and Hives    Other Reaction: OTHER REACTION Other  Reaction: OTHER REACTION Mouth blisters 03-29-11  . Atorvastatin Diarrhea    REACTION: diarrhe  . Ezetimibe     REACTION: nausea  . Fluvastatin Sodium Other (See Comments)    "Can't remember"  . Magnesium Hydroxide Other (See Comments)    REACTION: triggers HAs  . Meloxicam Other (See Comments)    Pt seeing auro's / spots.    . Omeprazole Magnesium     auras  . Pneumovax [Pneumococcal Polysaccharide Vaccine]     Local reaction  . Quinapril Hcl Other (See Comments)    03-29-11 "feelings of tiredness"  . Simvastatin Diarrhea  . Topamax [Topiramate] Itching  . Valsartan Itching and Other (See Comments)    REACTION: toes itch  . Vit D-Vit E-Safflower Oil Other (See Comments)    Headaches  . Carvedilol     "teeth hurt when I took it"   Allergies as of 01/07/2017      Reactions   Penicillins Other (See Comments), Rash, Hives   Other Reaction: OTHER REACTION Other Reaction: OTHER REACTION Mouth blisters 03-29-11   Atorvastatin Diarrhea   REACTION: diarrhe   Ezetimibe    REACTION: nausea   Fluvastatin Sodium Other (See Comments)   "Can't remember"   Magnesium Hydroxide Other (See Comments)   REACTION: triggers HAs   Meloxicam Other (See Comments)   Pt seeing auro's / spots.     Omeprazole  Magnesium    auras   Pneumovax [pneumococcal Polysaccharide Vaccine]    Local reaction   Quinapril Hcl Other (See Comments)   03-29-11 "feelings of tiredness"   Simvastatin Diarrhea   Topamax [topiramate] Itching   Valsartan Itching, Other (See Comments)   REACTION: toes itch   Vit D-vit E-safflower Oil Other (See Comments)   Headaches   Carvedilol    "teeth hurt when I took it"      Medication List       Accurate as of 01/07/17  4:05 PM. Always use your most recent med list.          acetaminophen-codeine 300-30 MG tablet Commonly known as:  TYLENOL #3 Take 1 tablet by mouth every 8 (eight) hours as needed. For migraine   aspirin 81 MG tablet Take 81 mg by mouth  daily.   aspirin-acetaminophen-caffeine 250-250-65 MG tablet Commonly known as:  EXCEDRIN MIGRAINE Take 1 tablet by mouth every 6 (six) hours as needed for headache.   doxycycline 100 MG tablet Commonly known as:  VIBRA-TABS Take 1 tablet (100 mg total) by mouth 2 (two) times daily.   hydrochlorothiazide 25 MG tablet Commonly known as:  HYDRODIURIL Take 1 tablet (25 mg total) by mouth daily as needed (swelling in the legs ).   hydroxyurea 500 MG capsule Commonly known as:  HYDREA Take 1 capsule (500 mg total) by mouth daily. May take with food to minimize GI side effects.   LORazepam 0.5 MG tablet Commonly known as:  ATIVAN Take 1 tablet by mouth every morning and 2 tablets by mouth at bedtime as needed   metoprolol tartrate 50 MG tablet Commonly known as:  LOPRESSOR Take 1 tablet (50 mg total) by mouth 2 (two) times daily.   omeprazole 20 MG capsule Commonly known as:  PRILOSEC Take 20 mg by mouth daily.            Discharge Care Instructions        Start     Ordered   01/07/17 0000  doxycycline (VIBRA-TABS) 100 MG tablet  2 times daily     01/07/17 1527      BP 120/82 (BP Location: Left Arm, Patient Position: Sitting, Cuff Size: Normal)   Pulse 74   Temp 98.7 F (37.1 C) (Oral)   Ht 5\' 5"  (1.651 m)   Wt 152 lb 2 oz (69 kg)   SpO2 98%   BMI 25.31 kg/m  Gen: awake, alert, appearing stated age Lungs: No accessory muscle use Skin: Mild erythema of nail pulp on 3rd R digit, +TTP. No drainage,  fluctuance, excoriation Psych: Age appropriate judgment and insight  Paronychia of finger of right hand - Plan: doxycycline (VIBRA-TABS) 100 MG tablet  Orders as above. Take with food. Allergy list reviewed. No need for I&D currently. F/u prn. The patient voiced understanding and agreement to the plan.  [place], DO 01/07/17 4:05 PM

## 2017-01-21 ENCOUNTER — Other Ambulatory Visit (HOSPITAL_BASED_OUTPATIENT_CLINIC_OR_DEPARTMENT_OTHER): Payer: Medicare Other

## 2017-01-21 ENCOUNTER — Ambulatory Visit (HOSPITAL_BASED_OUTPATIENT_CLINIC_OR_DEPARTMENT_OTHER): Payer: Medicare Other | Admitting: Family

## 2017-01-21 VITALS — BP 123/53 | HR 70 | Temp 97.8°F | Wt 153.0 lb

## 2017-01-21 DIAGNOSIS — R5383 Other fatigue: Secondary | ICD-10-CM | POA: Diagnosis not present

## 2017-01-21 DIAGNOSIS — D509 Iron deficiency anemia, unspecified: Secondary | ICD-10-CM

## 2017-01-21 DIAGNOSIS — D45 Polycythemia vera: Secondary | ICD-10-CM

## 2017-01-21 DIAGNOSIS — Z170 Estrogen receptor positive status [ER+]: Secondary | ICD-10-CM

## 2017-01-21 DIAGNOSIS — Z853 Personal history of malignant neoplasm of breast: Secondary | ICD-10-CM

## 2017-01-21 DIAGNOSIS — D751 Secondary polycythemia: Secondary | ICD-10-CM

## 2017-01-21 DIAGNOSIS — D500 Iron deficiency anemia secondary to blood loss (chronic): Secondary | ICD-10-CM

## 2017-01-21 DIAGNOSIS — C50011 Malignant neoplasm of nipple and areola, right female breast: Secondary | ICD-10-CM

## 2017-01-21 LAB — CBC WITH DIFFERENTIAL (CANCER CENTER ONLY)
BASO#: 0 10*3/uL (ref 0.0–0.2)
BASO%: 0.5 % (ref 0.0–2.0)
EOS%: 1.5 % (ref 0.0–7.0)
Eosinophils Absolute: 0.1 10*3/uL (ref 0.0–0.5)
HCT: 42.5 % (ref 34.8–46.6)
HGB: 13.1 g/dL (ref 11.6–15.9)
LYMPH#: 1 10*3/uL (ref 0.9–3.3)
LYMPH%: 17.7 % (ref 14.0–48.0)
MCH: 28.2 pg (ref 26.0–34.0)
MCHC: 30.8 g/dL — ABNORMAL LOW (ref 32.0–36.0)
MCV: 92 fL (ref 81–101)
MONO#: 0.6 10*3/uL (ref 0.1–0.9)
MONO%: 10.2 % (ref 0.0–13.0)
NEUT#: 3.9 10*3/uL (ref 1.5–6.5)
NEUT%: 70.1 % (ref 39.6–80.0)
Platelets: 322 10*3/uL (ref 145–400)
RBC: 4.64 10*6/uL (ref 3.70–5.32)
RDW: 17.3 % — ABNORMAL HIGH (ref 11.1–15.7)
WBC: 5.5 10*3/uL (ref 3.9–10.0)

## 2017-01-21 LAB — CHCC SATELLITE - SMEAR

## 2017-01-21 NOTE — Progress Notes (Signed)
Hematology and Oncology Follow Up Visit  [name] [id number] 04/14/1930 83 y.o. 01/21/2017   Principle Diagnosis:  Polycythemia vera-JAK2 positive Stage I (T1bN0M0 ) infiltrating duct carcinoma the left breast   Current Therapy:   Phlebotomy to maintain hematocrit below 45% Hydrea 500 mg by mouth every day  Past Therapy: Femara 2.5 mg by mouth daily - stopped 08/2015   Interim History:  [name] is here today for follow-up. She states that she is feeling fatigued and does not have much of an appetite. She is staying hydrated and her weight is stable.  She continues to take her hydrea 500 mg PO daily. Platelet count today is 322.  Hct is 42.5 so she will not require a phlebotomy this visit.  She has had no fever, chills, n/v, cough, rash, dizziness, SOB, chest pain, palpitations, abdominal pain or changes in bowel or bladder habits.  She has occasional episodes of diarrhea and will take imodium as needed.  No swelling, tenderness, numbness or tingling in her extremities.  No falls since March, no syncopal episodes.  She is still going to water aerobics several times a week for exercise.   ECOG Performance Status: 1 - Symptomatic but completely ambulatory  Medications:  Allergies as of 01/21/2017      Reactions   Penicillins Other (See Comments), Rash, Hives   Other Reaction: OTHER REACTION Other Reaction: OTHER REACTION Mouth blisters 03-29-11   Atorvastatin Diarrhea   REACTION: diarrhe   Ezetimibe    REACTION: nausea   Fluvastatin Sodium Other (See Comments)   "Can't remember"   Magnesium Hydroxide Other (See Comments)   REACTION: triggers HAs   Meloxicam Other (See Comments)   Pt seeing auro's / spots.     Omeprazole Magnesium    auras   Pneumovax [pneumococcal Polysaccharide Vaccine]    Local reaction   Quinapril Hcl Other (See Comments)   03-29-11 "feelings of tiredness"   Simvastatin Diarrhea   Topamax [topiramate] Itching   Valsartan Itching,  Other (See Comments)   REACTION: toes itch   Vit D-vit E-safflower Oil Other (See Comments)   Headaches   Carvedilol    "teeth hurt when I took it"      Medication List       Accurate as of 01/21/17  3:29 PM. Always use your most recent med list.          acetaminophen-codeine 300-30 MG tablet Commonly known as:  TYLENOL #3 Take 1 tablet by mouth every 8 (eight) hours as needed. For migraine   aspirin 81 MG tablet Take 81 mg by mouth daily.   aspirin-acetaminophen-caffeine 250-250-65 MG tablet Commonly known as:  EXCEDRIN MIGRAINE Take 1 tablet by mouth every 6 (six) hours as needed for headache.   hydrochlorothiazide 25 MG tablet Commonly known as:  HYDRODIURIL Take 1 tablet (25 mg total) by mouth daily as needed (swelling in the legs ).   hydroxyurea 500 MG capsule Commonly known as:  HYDREA Take 1 capsule (500 mg total) by mouth daily. May take with food to minimize GI side effects.   LORazepam 0.5 MG tablet Commonly known as:  ATIVAN Take 1 tablet by mouth every morning and 2 tablets by mouth at bedtime as needed   metoprolol tartrate 50 MG tablet Commonly known as:  LOPRESSOR Take 1 tablet (50 mg total) by mouth 2 (two) times daily.   omeprazole 20 MG capsule Commonly known as:  PRILOSEC Take 20 mg by mouth daily.  Allergies:  Allergies  Allergen Reactions  . Penicillins Other (See Comments), Rash and Hives    Other Reaction: OTHER REACTION Other Reaction: OTHER REACTION Mouth blisters 03-29-11  . Atorvastatin Diarrhea    REACTION: diarrhe  . Ezetimibe     REACTION: nausea  . Fluvastatin Sodium Other (See Comments)    "Can't remember"  . Magnesium Hydroxide Other (See Comments)    REACTION: triggers HAs  . Meloxicam Other (See Comments)    Pt seeing auro's / spots.    . Omeprazole Magnesium     auras  . Pneumovax [Pneumococcal Polysaccharide Vaccine]     Local reaction  . Quinapril Hcl Other (See Comments)    03-29-11 "feelings of  tiredness"  . Simvastatin Diarrhea  . Topamax [Topiramate] Itching  . Valsartan Itching and Other (See Comments)    REACTION: toes itch  . Vit D-Vit E-Safflower Oil Other (See Comments)    Headaches  . Carvedilol     "teeth hurt when I took it"    Past Medical History, Surgical history, Social history, and Family History were reviewed and updated.  Review of Systems: All other 10 point review of systems is negative.   Physical Exam:  weight is 153 lb (69.4 kg). Her oral temperature is 97.8 F (36.6 C). Her blood pressure is 123/53 (abnormal) and her pulse is 70.   Wt Readings from Last 3 Encounters:  01/21/17 153 lb (69.4 kg)  01/07/17 152 lb 2 oz (69 kg)  12/18/16 152 lb 8 oz (69.2 kg)    Ocular: Sclerae unicteric, pupils equal, round and reactive to light Ear-nose-throat: Oropharynx clear, dentition fair Lymphatic: No cervical, supraclavicular or axillary adenopathy Lungs no rales or rhonchi, good excursion bilaterally Heart regular rate and rhythm, no murmur appreciated Abd soft, nontender, positive bowel sounds, no liver or spleen tip palpated on exam, no fluid wave  MSK no focal spinal tenderness, no joint edema Neuro: non-focal, well-oriented, appropriate affect Breasts: Left breast lumpectomy site intact. No changes to right breast. No mass, lesion or rash noted on exam.   Lab Results  Component Value Date   WBC 5.5 01/21/2017   HGB 13.1 01/21/2017   HCT 42.5 01/21/2017   MCV 92 01/21/2017   PLT 322 01/21/2017   Lab Results  Component Value Date   FERRITIN 35 11/16/2016   IRON 35 (L) 11/16/2016   TIBC 424 11/16/2016   UIBC 389 (H) 11/16/2016   IRONPCTSAT 8 (L) 11/16/2016   Lab Results  Component Value Date   RETICCTPCT 0.8 02/02/2015   RBC 4.64 01/21/2017   RETICCTABS 41.4 02/02/2015   No results found for: KPAFRELGTCHN, LAMBDASER, KAPLAMBRATIO No results found for: IGGSERUM, IGA, IGMSERUM No results found for: Odetta Pink, SPEI   Chemistry      Component Value Date/Time   NA 138 11/16/2016 1346   NA 139 04/10/2016 0855   K 4.0 11/16/2016 1346   K 4.2 04/10/2016 0855   CL 98 11/16/2016 1346   CO2 32 11/16/2016 1346   CO2 29 04/10/2016 0855   BUN 7 11/16/2016 1346   BUN 10.0 04/10/2016 0855   CREATININE 0.5 (L) 11/16/2016 1346   CREATININE 0.8 04/10/2016 0855      Component Value Date/Time   CALCIUM 9.2 11/16/2016 1346   CALCIUM 9.5 04/10/2016 0855   ALKPHOS 99 (H) 11/16/2016 1346   ALKPHOS 85 04/10/2016 0855   AST 24 11/16/2016 1346   AST 19 04/10/2016 0855  ALT 21 11/16/2016 1346   ALT 13 04/10/2016 0855   BILITOT 1.50 11/16/2016 1346   BILITOT 1.26 (H) 04/10/2016 0855      Impression and Plan: Ms. Linsley is a very pleasant 83 yo caucasian female with with polycythemia and also history of stage I infiltrating ductal carcinoma of the let breast with lumpectomy in 2013. She completed 4 years of Femara in April 2017. So far there has been no evidence of recurrence. She has had some fatigue and history of iron deficiency due to phlebotomy. We will see what her iron studies show.  She is doing well on Hydrea, platelet count is 322.  Hct today is 42.5 so she will not need a phlebotomy at this time. We will continue to follow along with her and plan to see her back in another 2 months.  She will contact our office with any questions or concerns. We can certainy see her sooner if need be.   Eliezer Bottom, NP 9/10/20183:29 PM

## 2017-01-22 LAB — FERRITIN: Ferritin: 34 ng/ml (ref 9–269)

## 2017-01-22 LAB — IRON AND TIBC
%SAT: 8 % — ABNORMAL LOW (ref 21–57)
Iron: 34 ug/dL — ABNORMAL LOW (ref 41–142)
TIBC: 408 ug/dL (ref 236–444)
UIBC: 373 ug/dL (ref 120–384)

## 2017-01-23 ENCOUNTER — Telehealth: Payer: Self-pay

## 2017-01-23 MED ORDER — LORAZEPAM 0.5 MG PO TABS: ORAL_TABLET | ORAL | 1 refills | Status: DC

## 2017-01-23 NOTE — Telephone Encounter (Signed)
Rx faxed to [facility] pharmacy.  

## 2017-01-23 NOTE — Telephone Encounter (Signed)
Pt is requesting refill on lorazepam 0.5mg .  Last OV: 12/18/2016 Last Fill: 11/30/2016 #90 and 1RF (Pt sig: 1 tab in AM and 2 in PM qhs PRN) UDS: 06/01/2015 Low risk  [place] database printed 11/29/2015; in media  Please advise.

## 2017-01-23 NOTE — Telephone Encounter (Signed)
Okay #90 and 1 refill 

## 2017-01-23 NOTE — Telephone Encounter (Signed)
Rx printed, awaiting MD signature.  

## 2017-01-28 ENCOUNTER — Other Ambulatory Visit: Payer: Self-pay | Admitting: Hematology & Oncology

## 2017-02-07 ENCOUNTER — Encounter: Payer: Self-pay | Admitting: Internal Medicine

## 2017-02-07 ENCOUNTER — Ambulatory Visit (HOSPITAL_BASED_OUTPATIENT_CLINIC_OR_DEPARTMENT_OTHER)
Admission: RE | Admit: 2017-02-07 | Discharge: 2017-02-07 | Disposition: A | Discharge status: Home / Self Care | Payer: Medicare Other | Source: Ambulatory Visit | Attending: Internal Medicine | Admitting: Internal Medicine

## 2017-02-07 ENCOUNTER — Ambulatory Visit (INDEPENDENT_AMBULATORY_CARE_PROVIDER_SITE_OTHER): Payer: Medicare Other | Admitting: Internal Medicine

## 2017-02-07 VITALS — BP 130/78 | HR 71 | Temp 97.8°F | Resp 14 | Ht 65.0 in | Wt 153.5 lb

## 2017-02-07 DIAGNOSIS — R079 Chest pain, unspecified: Secondary | ICD-10-CM | POA: Diagnosis not present

## 2017-02-07 LAB — CBC WITH DIFFERENTIAL/PLATELET
Basophils Absolute: 0.1 10*3/uL (ref 0.0–0.1)
Basophils Relative: 1.4 % (ref 0.0–3.0)
Eosinophils Absolute: 0.1 10*3/uL (ref 0.0–0.7)
Eosinophils Relative: 1.7 % (ref 0.0–5.0)
HCT: 39.5 % (ref 36.0–46.0)
Hemoglobin: 12.3 g/dL (ref 12.0–15.0)
Lymphocytes Relative: 17.4 % (ref 12.0–46.0)
Lymphs Abs: 0.7 10*3/uL (ref 0.7–4.0)
MCHC: 31.2 g/dL (ref 30.0–36.0)
MCV: 91.3 fl (ref 78.0–100.0)
Monocytes Absolute: 0.4 10*3/uL (ref 0.1–1.0)
Monocytes Relative: 10.3 % (ref 3.0–12.0)
Neutro Abs: 2.7 10*3/uL (ref 1.4–7.7)
Neutrophils Relative %: 69.2 % (ref 43.0–77.0)
Platelets: 282 10*3/uL (ref 150.0–400.0)
RBC: 4.32 Mil/uL (ref 3.87–5.11)
RDW: 17.7 % — ABNORMAL HIGH (ref 11.5–15.5)
WBC: 3.9 10*3/uL — ABNORMAL LOW (ref 4.0–10.5)

## 2017-02-07 LAB — BASIC METABOLIC PANEL
BUN: 5 mg/dL — ABNORMAL LOW (ref 6–23)
CO2: 32 mEq/L (ref 19–32)
Calcium: 9.3 mg/dL (ref 8.4–10.5)
Chloride: 102 mEq/L (ref 96–112)
Creatinine, Ser: 0.64 mg/dL (ref 0.40–1.20)
GFR: 93.62 mL/min (ref >60.00)
Glucose, Bld: 117 mg/dL — ABNORMAL HIGH (ref 70–99)
Potassium: 3.9 mEq/L (ref 3.5–5.1)
Sodium: 139 mEq/L (ref 135–145)

## 2017-02-07 NOTE — Assessment & Plan Note (Signed)
Chest pain: As described above, sometimes associated with upper back pain, not exertional, may last few hours, pain is intense at times. GI ROS (-) Chart reviewed, h/o Takotsubo cardiomyopathy remotely; NL echo and Lexiscan 2012. EKG today: No acute changes Will get a chest x-ray to rule out widened mediastinum, get a BMP and CBC. Refer to cardiology, ER if symptoms severe. RTC 3 months

## 2017-02-07 NOTE — Progress Notes (Signed)
Pre visit review using our clinic review tool, if applicable. No additional management support is needed unless otherwise documented below in the visit note. 

## 2017-02-07 NOTE — Patient Instructions (Addendum)
GO TO THE LAB : Get the blood work     GO TO THE FRONT DESK Schedule your next appointment for a checkup in 3 months    STOP BY THE FIRST FLOOR:  get the XR     

## 2017-02-07 NOTE — Progress Notes (Signed)
Subjective:    Patient ID: [name], female    DOB: Jan 23, 1930, 83 y.o.   MRN: [id number]  DOS:  02/07/2017 Type of visit - description : acute  Interval history: Several weeks history of chest pain, more intense in the last 2 weeks but no symptom in the last 2 days. The pain is almost daily, may last few hours, it is at the anterior chest bilaterally, sometimes but not always associated with upper back pain. Intensity has been as high as 8/10, the only thing she has taken for these is "gas-X" as she felt sx could be related to her  stomach. Denies associated nausea, diaphoresis, radiation to the left arm or left neck. Other than that she states "I don't feel bad" she remains active and take frequent walks without any symptoms. + Stress but only for the last 48 hours when she learned that her [name] has been diagnosed, unfortunately, with pancreatic cancer   Review of Systems No fever chills No shortness of breath, lower extremity edema or palpitations. No recent prolonged trips. No recent rash No cough or dizziness No heartburn or dysphagia.  Past Medical History:  Diagnosis Date  . CAD (coronary artery disease)    mild nonobstructive disease on cath in 2003  . Cancer ([place])   . Cardiomyopathy    Probable Takotsubo, severe CP w/ normal cath in 1994. Severe CP in 2003 w/ widespread T wave inversions on ECG. Cath w/ minimal coronary disease but LV-gram showed periapical severe hypokinesis and basilar hyperkinesia (EF 40%). Last echo in 4/09 confirmed full LV functional recovery with EF 60%, no regional wall motion abnormalities, mild to moderate LVH.  Marland Kitchen CVA (cerebral infarction)   . Depression with anxiety 03-29-11   lost husband 3'09  . Diverticulitis   . DVT (deep venous thrombosis) ([place])    after venous ablation, R leg  . E. coli gastroenteritis 03-29-11   8'10  . Fracture 09/10/07   L2, status post vertebroplasty of L2 performed by IR  . GERD  (gastroesophageal reflux disease)   . Headache(784.0)    migraines  . Hemorrhoids   . Hiatal hernia 03-29-11   no nerve problems  . Hyperlipidemia   . Hyperplastic colon polyp 06/2001  . Hypertension   . OA (osteoarthritis) of knee 03-29-11   w/ bilateral knee pain-not a problem now  . OSA (obstructive sleep apnea) 03-29-11   no cpap used, not a problem now.  . Osteoporosis   . Otalgia of both ears    Dr. Simeon Craft  . Polycythemia   . Stroke [facility]) 03-29-11   CVA x2 -last 10'12-?TIA(visual problems)  . Varicose vein     Past Surgical History:  Procedure Laterality Date  . APPENDECTOMY    . BACK SURGERY    . BREAST BIOPSY    . BREAST CYST EXCISION    . BREAST LUMPECTOMY Left 2013   left stage I left breast cancer  . CHOLECYSTECTOMY  04/09/2011   Procedure: LAPAROSCOPIC CHOLECYSTECTOMY WITH INTRAOPERATIVE CHOLANGIOGRAM;  Surgeon: Odis Hollingshead, MD;  Location: [facility];  Service: General;  Laterality: N/A;  . Dental Extraction     L maxillary molar  . KYPHOSIS SURGERY  08/2007   cement used  . OVARIAN CYST SURGERY     left  . TONSILLECTOMY    . TOTAL KNEE ARTHROPLASTY  06/2010   left  . TUBAL LIGATION      Social History   Social History  . Marital status:  Widowed    Spouse name: N/A  . Number of children: 3  . Years of education: N/A   Occupational History  . n/a    Social History Main Topics  . Smoking status: Former Smoker    Packs/day: 1.00    Years: 37.00    Types: Cigarettes    Start date: 06/26/1951    Quit date: 05/14/1988  . Smokeless tobacco: Never Used     Comment: quit 25 years ago  . Alcohol use 0.6 oz/week    1 Cans of beer per week     Comment: occasional/social  . Drug use: No  . Sexual activity: No   Other Topics Concern  . Not on file   Social History Narrative   Lost husband 07/29/07-    Lives in [place] in a town house by herself,daughter moved out ~ 03-2015, has 2 daughters in town   Still drives                    Allergies as of 02/07/2017      Reactions   Penicillins Other (See Comments), Rash, Hives   Other Reaction: OTHER REACTION Other Reaction: OTHER REACTION Mouth blisters 03-29-11   Atorvastatin Diarrhea   REACTION: diarrhe   Ezetimibe    REACTION: nausea   Fluvastatin Sodium Other (See Comments)   "Can't remember"   Magnesium Hydroxide Other (See Comments)   REACTION: triggers HAs   Meloxicam Other (See Comments)   Pt seeing auro's / spots.     Omeprazole Magnesium    auras   Pneumovax [pneumococcal Polysaccharide Vaccine]    Local reaction   Quinapril Hcl Other (See Comments)   03-29-11 "feelings of tiredness"   Simvastatin Diarrhea   Topamax [topiramate] Itching   Valsartan Itching, Other (See Comments)   REACTION: toes itch   Vit D-vit E-safflower Oil Other (See Comments)   Headaches   Carvedilol    "teeth hurt when I took it"      Medication List       Accurate as of 02/07/17  5:38 PM. Always use your most recent med list.          acetaminophen-codeine 300-30 MG tablet Commonly known as:  TYLENOL #3 Take 1 tablet by mouth every 8 (eight) hours as needed. For migraine   aspirin 81 MG tablet Take 81 mg by mouth daily.   aspirin-acetaminophen-caffeine 250-250-65 MG tablet Commonly known as:  EXCEDRIN MIGRAINE Take 1 tablet by mouth every 6 (six) hours as needed for headache.   hydrochlorothiazide 25 MG tablet Commonly known as:  HYDRODIURIL Take 1 tablet (25 mg total) by mouth daily as needed (swelling in the legs ).   hydroxyurea 500 MG capsule Commonly known as:  HYDREA Take 1 capsule (500 mg total) by mouth daily. May take with food to minimize GI side effects.   LORazepam 0.5 MG tablet Commonly known as:  ATIVAN Take 1 tablet by mouth every morning and 2 tablets by mouth at bedtime as needed   metoprolol tartrate 50 MG tablet Commonly known as:  LOPRESSOR Take 1 tablet (50 mg total) by mouth 2 (two) times daily.   omeprazole 20 MG  capsule Commonly known as:  PRILOSEC Take 20 mg by mouth daily.            Discharge Care Instructions        Start     Ordered   02/07/17 0000  EKG 12-Lead  02/07/17 1007   02/07/17 0000  DG Chest 2 View    Question Answer Comment  Reason for Exam (SYMPTOM  OR DIAGNOSIS REQUIRED) chest pain   Preferred imaging location? [facility]      02/07/17 1007   02/07/17 0000  CBC with Differential/Platelet     02/07/17 1027   02/07/17 0211  Basic metabolic panel     15/52/08 1027   02/07/17 0000  Ambulatory referral to Cardiology     02/07/17 1042         Objective:   Physical Exam BP 130/78 (BP Location: Left Arm, Patient Position: Sitting, Cuff Size: Small)   Pulse 71   Temp 97.8 F (36.6 C) (Oral)   Resp 14   Ht 5\' 5"  (1.651 m)   Wt 153 lb 8 oz (69.6 kg)   SpO2 97%   BMI 25.54 kg/m  General:   Well developed, well nourished . NAD.  HEENT:  Normocephalic . Face symmetric, atraumatic Lungs:  CTA B Normal respiratory effort, no intercostal retractions, no accessory muscle use. Heart: RRR,  no murmur.  no pretibial edema bilaterally  Normal bilateral carotid and femoral pulses Abdomen:  Not distended, soft, non-tender. No rebound or rigidity.  Skin: Not pale. Not jaundice Neurologic:  alert & oriented X3.  Speech normal, gait appropriate for age and unassisted Psych--  Cognition and judgment appear intact.  Cooperative with normal attention span and concentration.  Behavior appropriate. No anxious or depressed appearing.    Assessment & Plan:   Assessment  HTN Dyslipidemia: Intolerant to most medicines including Zetia, Pravachol, Niaspan Depression, anxiety , insomnia - on ativan bid (03-2015 citalopram cause drowsiness, 04-2015 prozac diarrhea) Hem-Onc: POLYCYTEMIA-- sees hem-onc H/o breast ca  MSK: ---DJD :  pain medication as needed ---Osteoporosis:  T score -2.8 (02-2013): declined treatment 04-2015 ---L2 fractures, s/p vertebral  plasty 2009 Migraine headaches GI: GERD- intolerant to prevacid, visual disturbances HH, diverticulitis, Colon polyps  CV: ---Cardiomyopathy Probable Takotsubo cardiomyopathy: CP w/ normal cath in 1994.   Severe CP  2003 with widespread T wave inversions on ECG.  Cath with minimal coronary disease but LV-gram showed periapical severe hypokinesis and basilar hyperkinesia (EF 40%) >>> ECHO 4/09 confirmed full LV functional recovery with EF 60%, mild LVH. 2012: Nl EF per echo, (-) Lexiscan ---Stroke / TIA 2012 Sleep apnea 2012, , not on CPAP H/oDVT, right leg after venous ablation    PLAN Chest pain: As described above, sometimes associated with upper back pain, not exertional, may last few hours, pain is intense at times. GI ROS (-) Chart reviewed, h/o Takotsubo cardiomyopathy remotely; NL echo and Lexiscan 2012. EKG today: No acute changes Will get a chest x-ray to rule out widened mediastinum, get a BMP and CBC. Refer to cardiology, ER if symptoms severe. RTC 3 months

## 2017-02-11 ENCOUNTER — Ambulatory Visit (INDEPENDENT_AMBULATORY_CARE_PROVIDER_SITE_OTHER): Payer: Medicare Other | Admitting: Cardiology

## 2017-02-11 ENCOUNTER — Encounter: Payer: Self-pay | Admitting: Cardiology

## 2017-02-11 ENCOUNTER — Ambulatory Visit (HOSPITAL_BASED_OUTPATIENT_CLINIC_OR_DEPARTMENT_OTHER): Payer: Self-pay | Admitting: Hematology & Oncology

## 2017-02-11 VITALS — BP 159/73 | HR 62 | Temp 97.8°F | Resp 18

## 2017-02-11 VITALS — BP 124/70 | HR 71 | Ht 66.0 in | Wt 152.8 lb

## 2017-02-11 DIAGNOSIS — C50011 Malignant neoplasm of nipple and areola, right female breast: Secondary | ICD-10-CM

## 2017-02-11 DIAGNOSIS — C50012 Malignant neoplasm of nipple and areola, left female breast: Secondary | ICD-10-CM

## 2017-02-11 DIAGNOSIS — I10 Essential (primary) hypertension: Secondary | ICD-10-CM

## 2017-02-11 DIAGNOSIS — D751 Secondary polycythemia: Secondary | ICD-10-CM

## 2017-02-11 DIAGNOSIS — R079 Chest pain, unspecified: Secondary | ICD-10-CM | POA: Diagnosis not present

## 2017-02-11 NOTE — Progress Notes (Signed)
I saw [name] today. This was a personal visit that she had with me. She wanted to discuss a medical situation with a family member.  [name]

## 2017-02-11 NOTE — Patient Instructions (Signed)
Medication Instructions:  Your physician recommends that you continue on your current medications as directed. Please refer to the Current Medication list given to you today.    Labwork: NONE  Testing/Procedures: You had an EKG today  Follow-Up: Your physician recommends that you schedule a follow-up appointment in: 3 months   Any Other Special Instructions Will Be Listed Below (If Applicable).     If you need a refill on your cardiac medications before your next appointment, please call your pharmacy.

## 2017-02-11 NOTE — Progress Notes (Signed)
Cardiology Office Note:    Date:  02/11/2017   ID:  [name], DOB July 25, 1929, MRN [id number]  PCP:  Colon Branch, MD  Cardiologist:  Shirlee More, MD   Referring MD: Colon Branch, MD  ASSESSMENT:    1. Chest pain in adult   2. Essential hypertension   3. Polycythemia    PLAN:    In order of problems listed above:  1. Improved continue current treatment aspirin statin nitroglycerin if needed and reassess in the office in 3 months and she does not desire a stress test at this time. 2. Stable continue current treatment with thiazide diuretic 3. Stable managed by hematology  Next appointment 3 months   Medication Adjustments/Labs and Tests Ordered: Current medicines are reviewed at length with the patient today.  Concerns regarding medicines are outlined above.  Orders Placed This Encounter  Procedures  . EKG 12-Lead   No orders of the defined types were placed in this encounter.    Chief Complaint  Patient presents with  . New Patient (Initial Visit)    per Dr Larose Kells to evaluate chest pain  . Chest Pain    x 1 week ago, history of mild CAD and Takotsubo cardiomyopathy  . Edema    in both ankles    History of Present Illness:    [name] is a 83 y.o. female with Takotsubo cardiomyopathy and mild non obstructive CAD at angiography in 2003 , hypertension, hyperlipidemia, PCV and stage 1 breast cancer with lumpectomy who is being seen today for the evaluation of chest pain at the request of Colon Branch, MD. She has had no further chest pain. What occurred lasted for up to a week waxed and waned diffuse precordial aching nonexertional no radiation unrelieved with rest and no associated shortness of breath or GI symptoms. She did not take nitroglycerin. Since then she has returned to normal. Initial EKG and repeat today normal. At this time she does not wish to pursue a stress test. I asked her to see me and to contact me she is having recurrent chest discomfort  and given a prescription for nitroglycerin.   Past Medical History:  Diagnosis Date  . CAD (coronary artery disease)    mild nonobstructive disease on cath in 2003  . Cancer ([place])   . Cardiomyopathy    Probable Takotsubo, severe CP w/ normal cath in 1994. Severe CP in 2003 w/ widespread T wave inversions on ECG. Cath w/ minimal coronary disease but LV-gram showed periapical severe hypokinesis and basilar hyperkinesia (EF 40%). Last echo in 4/09 confirmed full LV functional recovery with EF 60%, no regional wall motion abnormalities, mild to moderate LVH.  Marland Kitchen CVA (cerebral infarction)   . Depression with anxiety 03-29-11   lost husband 3'09  . Diverticulitis   . DVT (deep venous thrombosis) ([place])    after venous ablation, R leg  . E. coli gastroenteritis 03-29-11   8'10  . Fracture 09/10/07   L2, status post vertebroplasty of L2 performed by IR  . GERD (gastroesophageal reflux disease)   . Headache(784.0)    migraines  . Hemorrhoids   . Hiatal hernia 03-29-11   no nerve problems  . Hyperlipidemia   . Hyperplastic colon polyp 06/2001  . Hypertension   . OA (osteoarthritis) of knee 03-29-11   w/ bilateral knee pain-not a problem now  . OSA (obstructive sleep apnea) 03-29-11   no cpap used, not a problem now.  . Osteoporosis   .  Otalgia of both ears    Dr. Simeon Craft  . Polycythemia   . Stroke [facility]) 03-29-11   CVA x2 -last 10'12-?TIA(visual problems)  . Varicose vein     Past Surgical History:  Procedure Laterality Date  . APPENDECTOMY    . BACK SURGERY    . BREAST BIOPSY    . BREAST CYST EXCISION    . BREAST LUMPECTOMY Left 2013   left stage I left breast cancer  . CHOLECYSTECTOMY  04/09/2011   Procedure: LAPAROSCOPIC CHOLECYSTECTOMY WITH INTRAOPERATIVE CHOLANGIOGRAM;  Surgeon: Odis Hollingshead, MD;  Location: [facility];  Service: General;  Laterality: N/A;  . Dental Extraction     L maxillary molar  . KYPHOSIS SURGERY  08/2007   cement used  . OVARIAN CYST SURGERY     left    . TONSILLECTOMY    . TOTAL KNEE ARTHROPLASTY  06/2010   left  . TUBAL LIGATION      Current Medications: Current Meds  Medication Sig  . acetaminophen-codeine (TYLENOL #3) 300-30 MG tablet Take 1 tablet by mouth every 8 (eight) hours as needed. For migraine  . aspirin 81 MG tablet Take 81 mg by mouth daily.  Marland Kitchen aspirin-acetaminophen-caffeine (EXCEDRIN MIGRAINE) 250-250-65 MG tablet Take 1 tablet by mouth every 6 (six) hours as needed for headache.  . hydrochlorothiazide (HYDRODIURIL) 25 MG tablet Take 1 tablet (25 mg total) by mouth daily as needed (swelling in the legs ).  . hydroxyurea (HYDREA) 500 MG capsule Take 1 capsule (500 mg total) by mouth daily. May take with food to minimize GI side effects.  Marland Kitchen LORazepam (ATIVAN) 0.5 MG tablet Take 1 tablet by mouth every morning and 2 tablets by mouth at bedtime as needed  . metoprolol tartrate (LOPRESSOR) 50 MG tablet Take 1 tablet (50 mg total) by mouth 2 (two) times daily.  Marland Kitchen omeprazole (PRILOSEC) 20 MG capsule Take 20 mg by mouth daily.     Allergies:   Penicillins; Atorvastatin; Ezetimibe; Fluvastatin sodium; Magnesium hydroxide; Meloxicam; Omeprazole magnesium; Pneumovax [pneumococcal polysaccharide vaccine]; Quinapril hcl; Simvastatin; Topamax [topiramate]; Valsartan; Vit d-vit e-safflower oil; and Carvedilol   Social History   Social History  . Marital status: Widowed    Spouse name: N/A  . Number of children: 3  . Years of education: N/A   Occupational History  . n/a    Social History Main Topics  . Smoking status: Former Smoker    Packs/day: 1.00    Years: 37.00    Types: Cigarettes    Start date: 06/26/1951    Quit date: 05/14/1988  . Smokeless tobacco: Never Used     Comment: quit 25 years ago  . Alcohol use 0.6 oz/week    1 Cans of beer per week     Comment: occasional/social  . Drug use: No  . Sexual activity: No   Other Topics Concern  . None   Social History Narrative   Lost husband 07/29/07-    Lives in  [place]   Lives in a town house by herself,daughter moved out ~ 03-2015, has 2 daughters in town   Still drives                  Family History: The patient's family history includes Breast cancer in her mother and sister; Colon cancer in her other; Heart disease in her father; Leukemia in her brother.  ROS:   ROS Please see the history of present illness.     All other systems reviewed and are  negative.  EKGs/Labs/Other Studies Reviewed:    The following studies were reviewed today:   EKG:  EKG is  ordered today.  The ekg ordered today demonstrates sinus rhythm normal  Recent Labs: 11/16/2016: ALT(SGPT) 21 02/07/2017: BUN 5; Creatinine, Ser 0.64; Hemoglobin 12.3; Platelets 282.0; Potassium 3.9; Sodium 139  Recent Lipid Panel    Component Value Date/Time   CHOL 212 (H) 03/19/2014 1141   TRIG 110.0 03/19/2014 1141   HDL 47.20 03/19/2014 1141   CHOLHDL 4 03/19/2014 1141   VLDL 22.0 03/19/2014 1141   LDLCALC 143 (H) 03/19/2014 1141   LDLDIRECT 142.3 06/25/2011 0954    Physical Exam:    VS:  BP 124/70 (BP Location: Left Arm, Patient Position: Sitting)   Pulse 71   Ht 5\' 6"  (1.676 m)   Wt 152 lb 12.8 oz (69.3 kg)   SpO2 97%   BMI 24.66 kg/m     Wt Readings from Last 3 Encounters:  02/11/17 152 lb 12.8 oz (69.3 kg)  02/07/17 153 lb 8 oz (69.6 kg)  01/21/17 153 lb (69.4 kg)     GEN:  Well nourished, well developed in no acute distress HEENT: Normal NECK: No JVD; No carotid bruits LYMPHATICS: No lymphadenopathy CARDIAC: RRR, no murmurs, rubs, gallops RESPIRATORY:  Clear to auscultation without rales, wheezing or rhonchi  ABDOMEN: Soft, non-tender, non-distended MUSCULOSKELETAL:  No edema; No deformity  SKIN: Warm and dry NEUROLOGIC:  Alert and oriented x 3 PSYCHIATRIC:  Normal affect     Signed, Shirlee More, MD  02/11/2017 2:17 PM    [facility]

## 2017-02-25 ENCOUNTER — Ambulatory Visit: Payer: Medicare Other | Admitting: Student

## 2017-02-27 LAB — HM DEXA SCAN

## 2017-03-08 DIAGNOSIS — M545 Low back pain: Secondary | ICD-10-CM | POA: Diagnosis not present

## 2017-03-18 ENCOUNTER — Other Ambulatory Visit: Payer: Medicare Other

## 2017-03-18 ENCOUNTER — Telehealth: Payer: Self-pay

## 2017-03-18 ENCOUNTER — Telehealth: Payer: Self-pay | Admitting: Internal Medicine

## 2017-03-18 DIAGNOSIS — Z79899 Other long term (current) drug therapy: Secondary | ICD-10-CM | POA: Diagnosis not present

## 2017-03-18 DIAGNOSIS — F418 Other specified anxiety disorders: Secondary | ICD-10-CM | POA: Diagnosis not present

## 2017-03-18 DIAGNOSIS — F341 Dysthymic disorder: Secondary | ICD-10-CM

## 2017-03-18 MED ORDER — LORAZEPAM 0.5 MG PO TABS: ORAL_TABLET | ORAL | 0 refills | Status: DC

## 2017-03-18 NOTE — Telephone Encounter (Signed)
Caller name: Carrol  Relation to pt: self  Call back number: [phone] Pharmacy: [facility], [address]  Reason for call: Pt came in office stating is needing refill on LORazepam (ATIVAN) 0.5 MG tablet. Pt states had called the pharmacy since Thursday 03-14-2017 and they stated the they requested the prescription. Pt would like to know if she can get rx today since she is here. Please advise ASAP

## 2017-03-18 NOTE — Telephone Encounter (Signed)
Rx forwarded to Pt at front desk (she came to pick up).

## 2017-03-18 NOTE — Telephone Encounter (Signed)
She is a little early but states that I told her okay to take some extra lorazepam if needed because her daughter is sick (I do not recall but is okay). Okay to refill #90, and 0. UDS today

## 2017-03-18 NOTE — Telephone Encounter (Signed)
Pt is requesting refill on lorazepam 0.5mg .   Last OV: 02/07/2017  Last Fill: 01/23/2017 #90 and 1RF (Pt sig: 1 tablet in AM and 2 tablets in PM PRN) UDS: 12/18/2016 Results not found in chart  NCCR printed;  Pt also on Tylenol #3; no other issues noted  Please adivse.

## 2017-03-18 NOTE — Telephone Encounter (Signed)
See other telephone note 03/18/2017.

## 2017-03-25 ENCOUNTER — Ambulatory Visit (HOSPITAL_BASED_OUTPATIENT_CLINIC_OR_DEPARTMENT_OTHER): Payer: Medicare Other | Admitting: Hematology & Oncology

## 2017-03-25 ENCOUNTER — Encounter: Payer: Self-pay | Admitting: Hematology & Oncology

## 2017-03-25 ENCOUNTER — Other Ambulatory Visit: Payer: Self-pay

## 2017-03-25 ENCOUNTER — Other Ambulatory Visit (HOSPITAL_BASED_OUTPATIENT_CLINIC_OR_DEPARTMENT_OTHER): Payer: Medicare Other

## 2017-03-25 VITALS — BP 136/65 | HR 66 | Temp 98.1°F | Resp 16 | Wt 150.0 lb

## 2017-03-25 DIAGNOSIS — Z170 Estrogen receptor positive status [ER+]: Secondary | ICD-10-CM

## 2017-03-25 DIAGNOSIS — D751 Secondary polycythemia: Secondary | ICD-10-CM

## 2017-03-25 DIAGNOSIS — C50011 Malignant neoplasm of nipple and areola, right female breast: Secondary | ICD-10-CM

## 2017-03-25 DIAGNOSIS — Z853 Personal history of malignant neoplasm of breast: Secondary | ICD-10-CM

## 2017-03-25 DIAGNOSIS — D500 Iron deficiency anemia secondary to blood loss (chronic): Secondary | ICD-10-CM

## 2017-03-25 DIAGNOSIS — D45 Polycythemia vera: Secondary | ICD-10-CM | POA: Diagnosis not present

## 2017-03-25 LAB — CBC WITH DIFFERENTIAL (CANCER CENTER ONLY)
BASO#: 0 10*3/uL (ref 0.0–0.2)
BASO%: 0.5 % (ref 0.0–2.0)
EOS%: 1.4 % (ref 0.0–7.0)
Eosinophils Absolute: 0.1 10*3/uL (ref 0.0–0.5)
HCT: 44.3 % (ref 34.8–46.6)
HGB: 13.9 g/dL (ref 11.6–15.9)
LYMPH#: 1.2 10*3/uL (ref 0.9–3.3)
LYMPH%: 15.4 % (ref 14.0–48.0)
MCH: 29.1 pg (ref 26.0–34.0)
MCHC: 31.4 g/dL — ABNORMAL LOW (ref 32.0–36.0)
MCV: 93 fL (ref 81–101)
MONO#: 0.8 10*3/uL (ref 0.1–0.9)
MONO%: 9.8 % (ref 0.0–13.0)
NEUT#: 5.8 10*3/uL (ref 1.5–6.5)
NEUT%: 72.9 % (ref 39.6–80.0)
Platelets: 494 10*3/uL — ABNORMAL HIGH (ref 145–400)
RBC: 4.77 10*6/uL (ref 3.70–5.32)
RDW: 14.5 % (ref 11.1–15.7)
WBC: 7.9 10*3/uL (ref 3.9–10.0)

## 2017-03-25 LAB — CMP (CANCER CENTER ONLY)
ALT(SGPT): 15 U/L (ref 10–47)
AST: 22 U/L (ref 11–38)
Albumin: 4 g/dL (ref 3.3–5.5)
Alkaline Phosphatase: 86 U/L — ABNORMAL HIGH (ref 26–84)
BUN, Bld: 7 mg/dL (ref 7–22)
CO2: 32 mEq/L (ref 18–33)
Calcium: 9.8 mg/dL (ref 8.0–10.3)
Chloride: 96 mEq/L — ABNORMAL LOW (ref 98–108)
Creat: 1 mg/dl (ref 0.6–1.2)
Glucose, Bld: 109 mg/dL (ref 73–118)
Potassium: 4.1 mEq/L (ref 3.3–4.7)
Sodium: 142 mEq/L (ref 128–145)
Total Bilirubin: 1.4 mg/dl (ref 0.20–1.60)
Total Protein: 7.4 g/dL (ref 6.4–8.1)

## 2017-03-25 NOTE — Progress Notes (Signed)
Hematology and Oncology Follow Up Visit  [name] [id number] [birth year] 83 y.o. 03/25/2017   Principle Diagnosis:  Polycythemia vera-JAK2 positive Stage I (T1bN0M0 ) infiltrating duct carcinoma the left breast   Current Therapy:   Phlebotomy to maintain hematocrit below 45% Hydrea 500 mg by mouth every day -start on 03/25/2017  Past Therapy: Femara 2.5 mg by mouth daily - stopped 08/2015   Interim History:  [name] is here today for follow-up.  Unfortunately, her daughter passed away last week.  This is been incredibly hard on her.  I know that this will be a very difficult Thanksgiving for her.  Thankfully, she has a very good family who will help her out.  She had been on Hydrea every other day.  Her platelet count has gone up a little bit.  As such, I will increase her Hydrea to daily dosing.  She has not required a phlebotomy for a while.  Some of the elevated platelet count might be from her iron deficiency.  Back in September, her ferritin was 34 with iron saturation of only 8%.    She has had no fever.  There is been no cough.  She has had no rashes.  There is been no leg swelling.  She has had no change in bowel or bladder habits.  She has had no headache.  .   ECOG Performance Status: 1 - Symptomatic but completely ambulatory  Medications:  Allergies as of 03/25/2017      Reactions   Penicillins Other (See Comments), Rash, Hives   Other Reaction: OTHER REACTION Other Reaction: OTHER REACTION Mouth blisters 03-29-11   Atorvastatin Diarrhea   REACTION: diarrhe   Ezetimibe    REACTION: nausea   Fluvastatin Sodium Other (See Comments)   "Can't remember"   Magnesium Hydroxide Other (See Comments)   REACTION: triggers HAs   Meloxicam Other (See Comments)   Pt seeing auro's / spots.     Omeprazole Magnesium    auras   Pneumovax [pneumococcal Polysaccharide Vaccine]    Local reaction   Quinapril Hcl Other (See Comments)   03-29-11 "feelings of  tiredness"   Simvastatin Diarrhea   Topamax [topiramate] Itching   Valsartan Itching, Other (See Comments)   REACTION: toes itch   Vit D-vit E-safflower Oil Other (See Comments)   Headaches   Carvedilol    "teeth hurt when I took it"      Medication List        Accurate as of 03/25/17  3:55 PM. Always use your most recent med list.          acetaminophen-codeine 300-30 MG tablet Commonly known as:  TYLENOL #3 Take 1 tablet by mouth every 8 (eight) hours as needed. For migraine   aspirin 81 MG tablet Take 81 mg by mouth daily.   aspirin-acetaminophen-caffeine 250-250-65 MG tablet Commonly known as:  EXCEDRIN MIGRAINE Take 1 tablet by mouth every 6 (six) hours as needed for headache.   hydrochlorothiazide 25 MG tablet Commonly known as:  HYDRODIURIL Take 1 tablet (25 mg total) by mouth daily as needed (swelling in the legs ).   hydroxyurea 500 MG capsule Commonly known as:  HYDREA Take 1 capsule (500 mg total) by mouth daily. May take with food to minimize GI side effects.   LORazepam 0.5 MG tablet Commonly known as:  ATIVAN Take 1 tablet by mouth every morning and 2 tablets by mouth at bedtime as needed   metoprolol tartrate 50 MG tablet Commonly known  as:  LOPRESSOR Take 1 tablet (50 mg total) by mouth 2 (two) times daily.   omeprazole 20 MG capsule Commonly known as:  PRILOSEC Take 20 mg by mouth daily.       Allergies:  Allergies  Allergen Reactions  . Penicillins Other (See Comments), Rash and Hives    Other Reaction: OTHER REACTION Other Reaction: OTHER REACTION Mouth blisters 03-29-11  . Atorvastatin Diarrhea    REACTION: diarrhe  . Ezetimibe     REACTION: nausea  . Fluvastatin Sodium Other (See Comments)    "Can't remember"  . Magnesium Hydroxide Other (See Comments)    REACTION: triggers HAs  . Meloxicam Other (See Comments)    Pt seeing auro's / spots.    . Omeprazole Magnesium     auras  . Pneumovax [Pneumococcal Polysaccharide Vaccine]      Local reaction  . Quinapril Hcl Other (See Comments)    03-29-11 "feelings of tiredness"  . Simvastatin Diarrhea  . Topamax [Topiramate] Itching  . Valsartan Itching and Other (See Comments)    REACTION: toes itch  . Vit D-Vit E-Safflower Oil Other (See Comments)    Headaches  . Carvedilol     "teeth hurt when I took it"    Past Medical History, Surgical history, Social history, and Family History were reviewed and updated.  Review of Systems: As stated in the interim history  Physical Exam:  vitals were not taken for this visit.   Wt Readings from Last 3 Encounters:  02/11/17 152 lb 12.8 oz (69.3 kg)  02/07/17 153 lb 8 oz (69.6 kg)  01/21/17 153 lb (69.4 kg)    Elderly white female in no obvious distress.  Vital signs show temperature of 98.1.  Pulse 66.  Blood pressure 126/65.  Weight is 150 pounds.  Her neck exam shows no ocular or oral lesions.  There are no palpable cervical or supraclavicular lymph nodes.  Lungs are clear.  Cardiac exam regular rate and rhythm with no murmurs, rubs or bruits.  Breast exam was not done this visit.  Abdomen is soft.  She has good bowel sounds.  There is no fluid wave.  There is no palpable liver or spleen tip.  Back exam shows no kyphosis.  She has no tenderness over the spine, ribs or hips.  Extremities shows no clubbing, cyanosis or edema.  Neurological exam shows no focal neurological deficits.    Lab Results  Component Value Date   WBC 7.9 03/25/2017   HGB 13.9 03/25/2017   HCT 44.3 03/25/2017   MCV 93 03/25/2017   PLT 494 (H) 03/25/2017   Lab Results  Component Value Date   FERRITIN 34 01/21/2017   IRON 34 (L) 01/21/2017   TIBC 408 01/21/2017   UIBC 373 01/21/2017   IRONPCTSAT 8 (L) 01/21/2017   Lab Results  Component Value Date   RETICCTPCT 0.8 02/02/2015   RBC 4.77 03/25/2017   RETICCTABS 41.4 02/02/2015   No results found for: KPAFRELGTCHN, LAMBDASER, KAPLAMBRATIO No results found for: IGGSERUM, IGA, IGMSERUM No  results found for: Odetta Pink, SPEI   Chemistry      Component Value Date/Time   NA 142 03/25/2017 1457   NA 139 04/10/2016 0855   K 4.1 03/25/2017 1457   K 4.2 04/10/2016 0855   CL 96 (L) 03/25/2017 1457   CO2 32 03/25/2017 1457   CO2 29 04/10/2016 0855   BUN 7 03/25/2017 1457   BUN 10.0 04/10/2016 0855  CREATININE 1.0 03/25/2017 1457   CREATININE 0.8 04/10/2016 0855      Component Value Date/Time   CALCIUM 9.8 03/25/2017 1457   CALCIUM 9.5 04/10/2016 0855   ALKPHOS 86 (H) 03/25/2017 1457   ALKPHOS 85 04/10/2016 0855   AST 22 03/25/2017 1457   AST 19 04/10/2016 0855   ALT 15 03/25/2017 1457   ALT 13 04/10/2016 0855   BILITOT 1.40 03/25/2017 1457   BILITOT 1.26 (H) 04/10/2016 0855      Impression and Plan: [name] is a very pleasant 83 yo caucasian female with with polycythemia and also history of stage I infiltrating ductal carcinoma of the let breast with lumpectomy in 2013.   She completed 4 years of Femara in April 2017.   So far there has been no evidence of recurrence.   I talked to her at length about her daughter's death.  I know this is really been difficult for her.  I know that she has has been a very hard time.  Hopefully, the daily Hydrea will bring her platelet count back down.  I would like to try to get her through the holidays and wintertime.  We will stay strong in prayer for her as she manages to get through her daughter's passing.  Volanda Napoleon, MD 11/12/20183:55 PM

## 2017-03-26 LAB — PAIN MGMT, PROFILE 8 W/CONF, U
6 Acetylmorphine: NEGATIVE ng/mL (ref <10)
Alcohol Metabolites: NEGATIVE ng/mL (ref <500)
Alphahydroxyalprazolam: NEGATIVE ng/mL (ref <25)
Alphahydroxymidazolam: NEGATIVE ng/mL (ref <50)
Alphahydroxytriazolam: NEGATIVE ng/mL (ref <50)
Aminoclonazepam: NEGATIVE ng/mL (ref <25)
Amphetamines: NEGATIVE ng/mL (ref <500)
Benzodiazepines: POSITIVE ng/mL — AB (ref <100)
Buprenorphine, Urine: NEGATIVE ng/mL (ref <5)
Cocaine Metabolite: NEGATIVE ng/mL (ref <150)
Codeine: 1956 ng/mL — ABNORMAL HIGH (ref <50)
Creatinine: 47.8 mg/dL
Hydrocodone: 61 ng/mL — ABNORMAL HIGH (ref <50)
Hydromorphone: NEGATIVE ng/mL (ref <50)
Hydroxyethylflurazepam: NEGATIVE ng/mL (ref <50)
Lorazepam: 399 ng/mL — ABNORMAL HIGH (ref <50)
MDMA: NEGATIVE ng/mL (ref <500)
Marijuana Metabolite: NEGATIVE ng/mL (ref <20)
Morphine: 312 ng/mL — ABNORMAL HIGH (ref <50)
Nordiazepam: NEGATIVE ng/mL (ref <50)
Norhydrocodone: 67 ng/mL — ABNORMAL HIGH (ref <50)
Opiates: POSITIVE ng/mL — AB (ref <100)
Oxazepam: NEGATIVE ng/mL (ref <50)
Oxidant: NEGATIVE ug/mL (ref <200)
Oxycodone: NEGATIVE ng/mL (ref <100)
Temazepam: NEGATIVE ng/mL (ref <50)
pH: 6.34 (ref 4.5–9.0)

## 2017-03-26 LAB — FERRITIN: Ferritin: 31 ng/ml (ref 9–269)

## 2017-03-26 LAB — IRON AND TIBC
%SAT: 6 % — ABNORMAL LOW (ref 21–57)
Iron: 27 ug/dL — ABNORMAL LOW (ref 41–142)
TIBC: 455 ug/dL — ABNORMAL HIGH (ref 236–444)
UIBC: 428 ug/dL — ABNORMAL HIGH (ref 120–384)

## 2017-04-08 ENCOUNTER — Other Ambulatory Visit: Payer: Self-pay | Admitting: Internal Medicine

## 2017-04-11 ENCOUNTER — Telehealth: Payer: Self-pay

## 2017-04-11 MED ORDER — LORAZEPAM 0.5 MG PO TABS: ORAL_TABLET | ORAL | 0 refills | Status: DC

## 2017-04-11 NOTE — Telephone Encounter (Signed)
Copied from [place]. Topic: Quick Communication - Rx Refill/Question >> Apr 11, 2017  8:13 AM Damita Dunnings, CMA wrote: Pt requesting refill on lorazepam 0.5mg .   Last OV: 02/07/2017 Last Fill: 03/18/2017 #90 and 0RF (Pt sig: 1 tab po in AM and 2 tabs po qhs prn) UDS: 03/18/2017 Low risk  NCCR printed 03/18/2017- in media  Please advise.

## 2017-04-11 NOTE — Telephone Encounter (Signed)
Rx faxed to [facility] pharmacy.  

## 2017-04-11 NOTE — Telephone Encounter (Signed)
Rx printed, awaiting MD signature.  

## 2017-04-11 NOTE — Telephone Encounter (Signed)
Ok 90, no RF 

## 2017-04-18 ENCOUNTER — Ambulatory Visit: Payer: Medicare Other | Admitting: *Deleted

## 2017-05-09 ENCOUNTER — Other Ambulatory Visit: Payer: Self-pay | Admitting: Hematology & Oncology

## 2017-05-09 ENCOUNTER — Telehealth: Payer: Self-pay

## 2017-05-09 MED ORDER — LORAZEPAM 0.5 MG PO TABS: ORAL_TABLET | ORAL | 2 refills | Status: DC

## 2017-05-09 NOTE — Progress Notes (Deleted)
Subjective:   [name] is a 83 y.o. female who presents for Medicare Annual (Subsequent) preventive examination.  Review of Systems: No ROS.  Medicare Wellness Visit. Additional risk factors are reflected in the social history.   Sleep patterns: Home Safety/Smoke Alarms: Feels safe in home. Smoke alarms in place.    Female:   Pap: last 09/28/16 normal Mammo-  Last 11/21/16: BI-RADS CATEGORY  2: Benign.     Dexa scan-        CCS-last 07/14/08: Mild diverticulosis. No f/u recommended on file.    Objective:     Vitals: There were no vitals taken for this visit.  There is no height or weight on file to calculate BMI.  Advanced Directives 03/25/2017 02/11/2017 01/21/2017 11/16/2016 10/04/2016 08/27/2016 07/26/2016  Does Patient Have a Medical Advance Directive? No No No No No No No  Would patient like information on creating a medical advance directive? - - - - - - No - Patient declined  Pre-existing out of facility DNR order (yellow form or pink MOST form) - - - - - - -    Tobacco Social History   Tobacco Use  Smoking Status Former Smoker  . Packs/day: 1.00  . Years: 37.00  . Pack years: 37.00  . Types: Cigarettes  . Start date: 06[birth year]26/1949  . Last attempt to quit: 05/14/1988  . Years since quitting: 29.0  Smokeless Tobacco Never Used  Tobacco Comment   quit 25 years ago     Counseling given: Not Answered Comment: quit 25 years ago   Clinical Intake:                       Past Medical History:  Diagnosis Date  . CAD (coronary artery disease)    mild nonobstructive disease on cath in 2003  . Cancer ([place])   . Cardiomyopathy    Probable Takotsubo, severe CP w/ normal cath in 1994. Severe CP in 2003 w/ widespread T wave inversions on ECG. Cath w/ minimal coronary disease but LV-gram showed periapical severe hypokinesis and basilar hyperkinesia (EF 40%). Last echo in 4/09 confirmed full LV functional recovery with EF 60%, no regional wall motion abnormalities,  mild to moderate LVH.  Marland Kitchen CVA (cerebral infarction)   . Depression with anxiety 03-29-11   lost husband 3'09  . Diverticulitis   . DVT (deep venous thrombosis) ([place])    after venous ablation, R leg  . E. coli gastroenteritis 03-29-11   8'10  . Fracture 09/10/07   L2, status post vertebroplasty of L2 performed by IR  . GERD (gastroesophageal reflux disease)   . Headache(784.0)    migraines  . Hemorrhoids   . Hiatal hernia 03-29-11   no nerve problems  . Hyperlipidemia   . Hyperplastic colon polyp 06/2001  . Hypertension   . OA (osteoarthritis) of knee 03-29-11   w/ bilateral knee pain-not a problem now  . OSA (obstructive sleep apnea) 03-29-11   no cpap used, not a problem now.  . Osteoporosis   . Otalgia of both ears    Dr. Simeon Craft  . Polycythemia   . Stroke [facility]) 03-29-11   CVA x2 -last 10'12-?TIA(visual problems)  . Varicose vein    Past Surgical History:  Procedure Laterality Date  . APPENDECTOMY    . BACK SURGERY    . BREAST BIOPSY    . BREAST CYST EXCISION    . BREAST LUMPECTOMY Left 2013   left stage I left breast  cancer  . CHOLECYSTECTOMY  04/09/2011   Procedure: LAPAROSCOPIC CHOLECYSTECTOMY WITH INTRAOPERATIVE CHOLANGIOGRAM;  Surgeon: Odis Hollingshead, MD;  Location: [facility] ORS;  Service: General;  Laterality: N/A;  . Dental Extraction     L maxillary molar  . KYPHOSIS SURGERY  08/2007   cement used  . OVARIAN CYST SURGERY     left  . TONSILLECTOMY    . TOTAL KNEE ARTHROPLASTY  06/2010   left  . TUBAL LIGATION     Family History  Problem Relation Age of Onset  . Breast cancer Mother   . Heart disease Father        MIs  . Colon cancer Other        cousin  . Breast cancer Sister   . Leukemia Brother        GM   Social History   Socioeconomic History  . Marital status: Widowed    Spouse name: Not on file  . Number of children: 3  . Years of education: Not on file  . Highest education level: Not on file  Social Needs  . Financial resource strain: Not  on file  . Food insecurity - worry: Not on file  . Food insecurity - inability: Not on file  . Transportation needs - medical: Not on file  . Transportation needs - non-medical: Not on file  Occupational History  . Occupation: n/a  Tobacco Use  . Smoking status: Former Smoker    Packs/day: 1.00    Years: 37.00    Pack years: 37.00    Types: Cigarettes    Start date: [birth year]26/1949    Last attempt to quit: 05/14/1988    Years since quitting: 29.0  . Smokeless tobacco: Never Used  . Tobacco comment: quit 25 years ago  Substance and Sexual Activity  . Alcohol use: Yes    Alcohol/week: 0.6 oz    Types: 1 Cans of beer per week    Comment: occasional/social  . Drug use: No  . Sexual activity: No  Other Topics Concern  . Not on file  Social History Narrative   Lost husband 07/29/07-    Lives in [place] in a town house by herself,daughter moved out ~ 03-2015, has 2 daughters in town   Still drives              Outpatient Encounter Medications as of 05/13/2017  Medication Sig  . acetaminophen-codeine (TYLENOL #3) 300-30 MG tablet Take 1 tablet by mouth every 8 (eight) hours as needed. For migraine  . aspirin 81 MG tablet Take 81 mg by mouth daily.  Marland Kitchen aspirin-acetaminophen-caffeine (EXCEDRIN MIGRAINE) 250-250-65 MG tablet Take 1 tablet by mouth every 6 (six) hours as needed for headache.  . hydrochlorothiazide (HYDRODIURIL) 25 MG tablet Take 1 tablet (25 mg total) by mouth daily as needed (swelling in the legs ).  . hydroxyurea (HYDREA) 500 MG capsule Take 1 capsule (500 mg total) by mouth daily. May take with food to minimize GI side effects.  . hydroxyurea (HYDREA) 500 MG capsule TAKE 1 CAPSULE BY MOUTH EVERY OTHER DAY TAKE WITH FOOD TO MINIMIZE SIDE EFFECT  . LORazepam (ATIVAN) 0.5 MG tablet Take 1 tablet by mouth every morning and 2 tablets by mouth at bedtime as needed  . metoprolol tartrate (LOPRESSOR) 50 MG tablet Take 1 tablet (50 mg total) by mouth 2 (two) times daily.    Marland Kitchen omeprazole (PRILOSEC) 20 MG capsule Take 20 mg by mouth daily.   No facility-administered  encounter medications on file as of 05/13/2017.     Activities of Daily Living In your present state of health, do you have any difficulty performing the following activities: 05/10/2016  Hearing? N  Vision? N  Difficulty concentrating or making decisions? N  Walking or climbing stairs? N  Dressing or bathing? N  Doing errands, shopping? N  Preparing Food and eating ? N  Using the Toilet? N  In the past six months, have you accidently leaked urine? Y  Comment wears pads  Do you have problems with loss of bowel control? N  Managing your Medications? N  Managing your Finances? N  Housekeeping or managing your Housekeeping? N  Some recent data might be hidden    Patient Care Team: Colon Branch, MD as PCP - [facility] [place], Holli Humbles, NP as Nurse Practitioner (Nurse Practitioner) Volanda Napoleon, MD as Consulting Physician (Oncology)    Assessment:   This is a routine wellness examination for Kenli. Physical assessment deferred to PCP.  Exercise Activities and Dietary recommendations   Diet (meal preparation, eat out, water intake, caffeinated beverages, dairy products, fruits and vegetables): {Desc; diets:16563} Breakfast: Lunch:  Dinner:      Goals    . Increase socialization (pt-stated)     Join card club        Fall Risk Fall Risk  05/10/2016 02/10/2016 01/09/2016 12/12/2015 11/11/2015  Falls in the past year? No Yes No No No  Number falls in past yr: - 1 - - -  Injury with Fall? - No - - -  Risk Factor Category  - - - - -  Risk for fall due to : - - - - -  Risk for fall due to: Comment - - - - -    Depression Screen PHQ 2/9 Scores 05/10/2016 05/04/2015 02/15/2015 11/12/2014  PHQ - 2 Score 0 0 0 0     Cognitive Function MMSE - Mini Mental State Exam 05/10/2016 05/04/2015  Orientation to time 5 5  Orientation to Place 5 5  Registration 3 3  Attention/ Calculation 5  5  Recall 3 3  Language- name 2 objects 2 2  Language- repeat 1 1  Language- follow 3 step command 3 3  Language- read & follow direction 1 1  Write a sentence 1 1  Copy design 1 1  Total score 30 30        Immunization History  Administered Date(s) Administered  . Influenza Split 03/21/2011  . Influenza, High Dose Seasonal PF 02/15/2015  . Influenza,inj,Quad PF,6+ Mos 03/19/2014, 02/10/2016  . Pneumococcal Conjugate-13 03/19/2014  . Pneumococcal Polysaccharide-23 08/18/2012  . Td 10/12/2005, 12/18/2016    Screening Tests Health Maintenance  Topic Date Due  . INFLUENZA VACCINE  12/12/2016  . TETANUS/TDAP  12/19/2026  . DEXA SCAN  Completed  . PNA vac Low Risk Adult  Completed      Plan:   ***   I have personally reviewed and noted the following in the patient's chart:   . Medical and social history . Use of alcohol, tobacco or illicit drugs  . Current medications and supplements . Functional ability and status . Nutritional status . Physical activity . Advanced directives . List of other physicians . Hospitalizations, surgeries, and ER visits in previous 12 months . Vitals . Screenings to include cognitive, depression, and falls . Referrals and appointments  In addition, I have reviewed and discussed with patient certain preventive protocols, quality metrics, and best practice recommendations. A  written personalized care plan for preventive services as well as general preventive health recommendations were provided to patient.     Shela Nevin, [place]  05/09/2017

## 2017-05-09 NOTE — Telephone Encounter (Signed)
RX sent

## 2017-05-09 NOTE — Telephone Encounter (Signed)
Pt is requesting refill on lorazepam.   Last OV: 02/07/2017  Last Fill: 04/11/2017 #90 and 0RF (Pt sig: 1 tablet every morning and 2 tablets every evening at bedtime prn) UDS: 03/28/2017 Low risk  Please advise.

## 2017-05-13 ENCOUNTER — Ambulatory Visit: Payer: Medicare Other | Admitting: *Deleted

## 2017-05-20 DIAGNOSIS — D692 Other nonthrombocytopenic purpura: Secondary | ICD-10-CM | POA: Diagnosis not present

## 2017-05-20 DIAGNOSIS — D225 Melanocytic nevi of trunk: Secondary | ICD-10-CM | POA: Diagnosis not present

## 2017-05-20 DIAGNOSIS — L570 Actinic keratosis: Secondary | ICD-10-CM | POA: Diagnosis not present

## 2017-05-20 DIAGNOSIS — L821 Other seborrheic keratosis: Secondary | ICD-10-CM | POA: Diagnosis not present

## 2017-05-20 DIAGNOSIS — L814 Other melanin hyperpigmentation: Secondary | ICD-10-CM | POA: Diagnosis not present

## 2017-05-20 DIAGNOSIS — Z23 Encounter for immunization: Secondary | ICD-10-CM | POA: Diagnosis not present

## 2017-05-20 DIAGNOSIS — Z85828 Personal history of other malignant neoplasm of skin: Secondary | ICD-10-CM | POA: Diagnosis not present

## 2017-05-20 DIAGNOSIS — L720 Epidermal cyst: Secondary | ICD-10-CM | POA: Diagnosis not present

## 2017-05-22 ENCOUNTER — Encounter: Payer: Self-pay | Admitting: *Deleted

## 2017-05-23 NOTE — Progress Notes (Signed)
Cardiology Office Note:    Date:  05/24/2017   ID:  [name], DOB 1929-11-15, MRN [id number]  PCP:  Colon Branch, MD  Cardiologist:  Shirlee More, MD    Referring MD: Colon Branch, MD    ASSESSMENT:    1. Mild CAD   2. Essential hypertension   3. Hyperlipidemia, unspecified hyperlipidemia type    PLAN:    In order of problems listed above:  1. Stable at her last visit we had discussed an ischemia evaluation she deferred and is done well she is not having angina she is active in the regular swimming program and at this point time I will see her back in the future as needed.  She will continue her medical therapy with aspirin beta-blocker and she is intolerant of lipid-lowering therapy. 2. Stable continue current treatment 3. Poorly controlled intolerant of lipid-lowering medications   Next appointment: As needed   Medication Adjustments/Labs and Tests Ordered: Current medicines are reviewed at length with the patient today.  Concerns regarding medicines are outlined above.  No orders of the defined types were placed in this encounter.  No orders of the defined types were placed in this encounter.   Chief Complaint  Patient presents with  . Coronary Artery Disease  . Hypertension  . Hyperlipidemia    History of Present Illness:    [name] is a 84 y.o. female with a hx of Takotsubo cardiomyopathy and mild non obstructive CAD at angiography in 2003 , hypertension, hyperlipidemia, PCV's and stage 1 breast cancer with lumpectomy  last seen 3 months ago for chest pain. Compliance with diet, lifestyle and medications: Yes she swims at the line a regular basis Sadly her daughter is diet in the interim she survive that grief and has had no recurrent anginal discomfort Past Medical History:  Diagnosis Date  . CAD (coronary artery disease)    mild nonobstructive disease on cath in 2003  . Cancer ([place])   . Cardiomyopathy    Probable Takotsubo, severe CP w/  normal cath in 1994. Severe CP in 2003 w/ widespread T wave inversions on ECG. Cath w/ minimal coronary disease but LV-gram showed periapical severe hypokinesis and basilar hyperkinesia (EF 40%). Last echo in 4/09 confirmed full LV functional recovery with EF 60%, no regional wall motion abnormalities, mild to moderate LVH.  Marland Kitchen CVA (cerebral infarction)   . Depression with anxiety 03-29-11   lost husband 3'09  . Diverticulitis   . DVT (deep venous thrombosis) ([place])    after venous ablation, R leg  . E. coli gastroenteritis 03-29-11   8'10  . Fracture 09/10/07   L2, status post vertebroplasty of L2 performed by IR  . GERD (gastroesophageal reflux disease)   . Headache(784.0)    migraines  . Hemorrhoids   . Hiatal hernia 03-29-11   no nerve problems  . Hyperlipidemia   . Hyperplastic colon polyp 06/2001  . Hypertension   . OA (osteoarthritis) of knee 03-29-11   w/ bilateral knee pain-not a problem now  . OSA (obstructive sleep apnea) 03-29-11   no cpap used, not a problem now.  . Osteoporosis   . Otalgia of both ears    Dr. Simeon Craft  . Polycythemia   . Stroke [facility]) 03-29-11   CVA x2 -last 10'12-?TIA(visual problems)  . Varicose vein     Past Surgical History:  Procedure Laterality Date  . APPENDECTOMY    . BACK SURGERY    . BREAST BIOPSY    .  BREAST CYST EXCISION    . BREAST LUMPECTOMY Left 2013   left stage I left breast cancer  . CHOLECYSTECTOMY  04/09/2011   Procedure: LAPAROSCOPIC CHOLECYSTECTOMY WITH INTRAOPERATIVE CHOLANGIOGRAM;  Surgeon: Odis Hollingshead, MD;  Location: [facility];  Service: General;  Laterality: N/A;  . Dental Extraction     L maxillary molar  . KYPHOSIS SURGERY  08/2007   cement used  . OVARIAN CYST SURGERY     left  . TONSILLECTOMY    . TOTAL KNEE ARTHROPLASTY  06/2010   left  . TUBAL LIGATION      Current Medications: Current Meds  Medication Sig  . acetaminophen-codeine (TYLENOL #3) 300-30 MG tablet Take 1 tablet by mouth every 8 (eight) hours  as needed. For migraine  . aspirin 81 MG tablet Take 81 mg by mouth daily.  Marland Kitchen aspirin-acetaminophen-caffeine (EXCEDRIN MIGRAINE) 250-250-65 MG tablet Take 1 tablet by mouth every 6 (six) hours as needed for headache.  . hydrochlorothiazide (HYDRODIURIL) 25 MG tablet Take 1 tablet (25 mg total) by mouth daily as needed (swelling in the legs ).  . hydroxyurea (HYDREA) 500 MG capsule Take 1 capsule (500 mg total) by mouth daily. May take with food to minimize GI side effects.  Marland Kitchen LORazepam (ATIVAN) 0.5 MG tablet Take 1 tablet by mouth every morning and 2 tablets by mouth at bedtime as needed  . metoprolol tartrate (LOPRESSOR) 50 MG tablet Take 1 tablet (50 mg total) by mouth 2 (two) times daily.  Marland Kitchen omeprazole (PRILOSEC) 20 MG capsule Take 20 mg by mouth daily.     Allergies:   Penicillins; Atorvastatin; Ezetimibe; Fluvastatin sodium; Magnesium hydroxide; Meloxicam; Omeprazole magnesium; Pneumovax [pneumococcal polysaccharide vaccine]; Quinapril hcl; Simvastatin; Topamax [topiramate]; Valsartan; Vit d-vit e-safflower oil; and Carvedilol   Social History   Socioeconomic History  . Marital status: Widowed    Spouse name: None  . Number of children: 3  . Years of education: None  . Highest education level: None  Social Needs  . Financial resource strain: None  . Food insecurity - worry: None  . Food insecurity - inability: None  . Transportation needs - medical: None  . Transportation needs - non-medical: None  Occupational History  . Occupation: n/a  Tobacco Use  . Smoking status: Former Smoker    Packs/day: 1.00    Years: 37.00    Pack years: 37.00    Types: Cigarettes    Start date: 06/26/1951    Last attempt to quit: 05/14/1988    Years since quitting: 29.0  . Smokeless tobacco: Never Used  . Tobacco comment: quit 25 years ago  Substance and Sexual Activity  . Alcohol use: Yes    Alcohol/week: 0.6 oz    Types: 1 Cans of beer per week    Comment: occasional/social  . Drug use: No    . Sexual activity: No  Other Topics Concern  . None  Social History Narrative   Lost husband 07/29/07-    Lives in [place]   Lives in a town house by herself,daughter moved out ~ 03-2015, has 2 daughters in town   Still drives               Family History: The patient's family history includes Breast cancer in her mother and sister; Colon cancer in her other; Heart disease in her father; Leukemia in her brother. ROS:   Please see the history of present illness.    All other systems reviewed and are negative.  EKGs/Labs/Other Studies  Reviewed:    The following studies were reviewed today  Recent Labs: 03/25/2017: ALT(SGPT) 15; BUN, Bld 7; Creat 1.0; HGB 13.9; Platelets 494; Potassium 4.1; Sodium 142  Recent Lipid Panel    Component Value Date/Time   CHOL 212 (H) 03/19/2014 1141   TRIG 110.0 03/19/2014 1141   HDL 47.20 03/19/2014 1141   CHOLHDL 4 03/19/2014 1141   VLDL 22.0 03/19/2014 1141   LDLCALC 143 (H) 03/19/2014 1141   LDLDIRECT 142.3 06/25/2011 0954    Physical Exam:    VS:  BP (!) 144/72 (BP Location: Right Arm, Patient Position: Sitting, Cuff Size: Normal)   Pulse 66   Ht 5\' 6"  (1.676 m)   Wt 147 lb 1.3 oz (66.7 kg)   SpO2 95%   BMI 23.74 kg/m     Wt Readings from Last 3 Encounters:  05/24/17 147 lb 1.3 oz (66.7 kg)  03/25/17 150 lb (68 kg)  02/11/17 152 lb 12.8 oz (69.3 kg)     GEN:  Well nourished, well developed in no acute distress HEENT: Normal NECK: No JVD; No carotid bruits LYMPHATICS: No lymphadenopathy CARDIAC: RRR, no murmurs, rubs, gallops RESPIRATORY:  Clear to auscultation without rales, wheezing or rhonchi  ABDOMEN: Soft, non-tender, non-distended MUSCULOSKELETAL:  No edema; No deformity  SKIN: Warm and dry NEUROLOGIC:  Alert and oriented x 3 PSYCHIATRIC:  Normal affect    Signed, Shirlee More, MD  05/24/2017 3:23 PM    [facility]

## 2017-05-24 ENCOUNTER — Encounter: Payer: Self-pay | Admitting: Cardiology

## 2017-05-24 ENCOUNTER — Ambulatory Visit (INDEPENDENT_AMBULATORY_CARE_PROVIDER_SITE_OTHER): Payer: Medicare Other

## 2017-05-24 ENCOUNTER — Ambulatory Visit (INDEPENDENT_AMBULATORY_CARE_PROVIDER_SITE_OTHER): Payer: Medicare Other | Admitting: Cardiology

## 2017-05-24 ENCOUNTER — Other Ambulatory Visit: Payer: Self-pay

## 2017-05-24 VITALS — BP 144/72 | HR 66 | Ht 66.0 in | Wt 147.1 lb

## 2017-05-24 DIAGNOSIS — E785 Hyperlipidemia, unspecified: Secondary | ICD-10-CM | POA: Diagnosis not present

## 2017-05-24 DIAGNOSIS — I10 Essential (primary) hypertension: Secondary | ICD-10-CM | POA: Diagnosis not present

## 2017-05-24 DIAGNOSIS — Z23 Encounter for immunization: Secondary | ICD-10-CM | POA: Diagnosis not present

## 2017-05-24 DIAGNOSIS — I2510 Atherosclerotic heart disease of native coronary artery without angina pectoris: Secondary | ICD-10-CM | POA: Diagnosis not present

## 2017-05-24 NOTE — Patient Instructions (Signed)
Medication Instructions:  Your physician recommends that you continue on your current medications as directed. Please refer to the Current Medication list given to you today.  Labwork: None  Testing/Procedures: None  Follow-Up: Your physician recommends that you schedule a follow-up appointment as needed if symptoms worsen or fail to improve.  Any Other Special Instructions Will Be Listed Below (If Applicable).     If you need a refill on your cardiac medications before your next appointment, please call your pharmacy.   

## 2017-05-27 ENCOUNTER — Other Ambulatory Visit: Payer: Medicare Other

## 2017-05-27 ENCOUNTER — Ambulatory Visit: Payer: Medicare Other | Admitting: Hematology & Oncology

## 2017-06-10 ENCOUNTER — Encounter: Payer: Self-pay | Admitting: Family

## 2017-06-10 ENCOUNTER — Inpatient Hospital Stay: Payer: Medicare Other

## 2017-06-10 ENCOUNTER — Other Ambulatory Visit: Payer: Self-pay

## 2017-06-10 ENCOUNTER — Inpatient Hospital Stay (HOSPITAL_BASED_OUTPATIENT_CLINIC_OR_DEPARTMENT_OTHER): Payer: Medicare Other | Admitting: Family

## 2017-06-10 ENCOUNTER — Inpatient Hospital Stay: Payer: Medicare Other | Attending: Hematology & Oncology

## 2017-06-10 VITALS — BP 162/75 | HR 64 | Temp 98.3°F | Resp 17 | Wt 148.4 lb

## 2017-06-10 VITALS — BP 135/66 | HR 70 | Resp 17

## 2017-06-10 DIAGNOSIS — D45 Polycythemia vera: Secondary | ICD-10-CM | POA: Insufficient documentation

## 2017-06-10 DIAGNOSIS — D751 Secondary polycythemia: Secondary | ICD-10-CM

## 2017-06-10 DIAGNOSIS — Z853 Personal history of malignant neoplasm of breast: Secondary | ICD-10-CM | POA: Insufficient documentation

## 2017-06-10 DIAGNOSIS — D500 Iron deficiency anemia secondary to blood loss (chronic): Secondary | ICD-10-CM

## 2017-06-10 LAB — CBC WITH DIFFERENTIAL (CANCER CENTER ONLY)
Basophils Absolute: 0.1 10*3/uL (ref 0.0–0.1)
Basophils Relative: 1 %
Eosinophils Absolute: 0.1 10*3/uL (ref 0.0–0.5)
Eosinophils Relative: 2 %
HCT: 46.5 % (ref 34.8–46.6)
Hemoglobin: 14.7 g/dL (ref 11.6–15.9)
Lymphocytes Relative: 18 %
Lymphs Abs: 1 10*3/uL (ref 0.9–3.3)
MCH: 28.9 pg (ref 26.0–34.0)
MCHC: 31.6 g/dL — ABNORMAL LOW (ref 32.0–36.0)
MCV: 91.5 fL (ref 81.0–101.0)
Monocytes Absolute: 0.6 10*3/uL (ref 0.1–0.9)
Monocytes Relative: 11 %
Neutro Abs: 4 10*3/uL (ref 1.5–6.5)
Neutrophils Relative %: 68 %
Platelet Count: 380 10*3/uL (ref 145–400)
RBC: 5.08 MIL/uL (ref 3.70–5.32)
RDW: 16 % — ABNORMAL HIGH (ref 11.1–15.7)
WBC Count: 5.8 10*3/uL (ref 3.9–10.3)

## 2017-06-10 LAB — CMP (CANCER CENTER ONLY)
ALT: 10 U/L (ref 0–55)
AST: 22 U/L (ref 5–34)
Albumin: 4.2 g/dL (ref 3.5–5.0)
Alkaline Phosphatase: 78 U/L (ref 40–150)
Anion gap: 8 (ref 3–11)
BUN: 11 mg/dL (ref 7–26)
CO2: 30 mmol/L — ABNORMAL HIGH (ref 22–29)
Calcium: 10 mg/dL (ref 8.4–10.4)
Chloride: 98 mmol/L (ref 98–109)
Creatinine: 0.84 mg/dL (ref 0.60–1.10)
GFR, Est AFR Am: >60 mL/min (ref >60)
GFR, Estimated: >60 mL/min (ref >60)
Glucose, Bld: 99 mg/dL (ref 70–140)
Potassium: 4.4 mmol/L (ref 3.3–4.7)
Sodium: 136 mmol/L (ref 136–145)
Total Bilirubin: 1.9 mg/dL — ABNORMAL HIGH (ref 0.2–1.2)
Total Protein: 7.5 g/dL (ref 6.4–8.3)

## 2017-06-10 LAB — LACTATE DEHYDROGENASE: LDH: 186 U/L (ref 125–245)

## 2017-06-10 NOTE — Progress Notes (Signed)
Hematology and Oncology Follow Up Visit  [name] [id number] 09-18-1929 84 y.o. 06/10/2017   Principle Diagnosis:  Polycythemia vera - JAK2 positive Stage I (T1bN0M0 ) infiltrating duct carcinoma the left breast   Past Therapy: Femara 2.5 mg by mouth daily - stopped 08/2015  Current Therapy:   Phlebotomy to maintain hematocrit below 45% Hydrea 500 mg by mouth every day - start on 03/25/2017   Interim History:  [name] is here today for follow-up. She is having some issues with her gums and teeth. She has an appointment with her oral surgeon to see if they need to be pulled.  She verbalized that she is taking her Hydrea 500 mg PO every day as prescribed. Platelet count is 380  She has had no episodes of bleeding, no bruising or petechiae.  No fever, chills, n/v, cough, rash, dizziness, SOB, chest pain, palpitations, abdominal pain or changes in bowel or bladder habits.  No lymphadenopathy found on exam.  No swelling, tenderness, numbness or tingling in her extremities. No c/o pain.  She has a good appetite and is staying well hydrated. Her weight is stable.   ECOG Performance Status: 1 - Symptomatic but completely ambulatory  Medications:  Allergies as of 06/10/2017      Reactions   Penicillins Other (See Comments), Rash, Hives   Other Reaction: OTHER REACTION Other Reaction: OTHER REACTION Mouth blisters 03-29-11   Atorvastatin Diarrhea   REACTION: diarrhe   Ezetimibe    REACTION: nausea   Fluvastatin Sodium Other (See Comments)   "Can't remember"   Magnesium Hydroxide Other (See Comments)   REACTION: triggers HAs   Meloxicam Other (See Comments)   Pt seeing auro's / spots.     Omeprazole Magnesium    auras   Pneumovax [pneumococcal Polysaccharide Vaccine]    Local reaction   Quinapril Hcl Other (See Comments)   03-29-11 "feelings of tiredness"   Simvastatin Diarrhea   Topamax [topiramate] Itching   Valsartan Itching, Other (See Comments)   REACTION:  toes itch   Vit D-vit E-safflower Oil Other (See Comments)   Headaches   Carvedilol    "teeth hurt when I took it"      Medication List        Accurate as of 06/10/17  2:23 PM. Always use your most recent med list.          acetaminophen-codeine 300-30 MG tablet Commonly known as:  TYLENOL #3 Take 1 tablet by mouth every 8 (eight) hours as needed. For migraine   aspirin 81 MG tablet Take 81 mg by mouth daily.   aspirin-acetaminophen-caffeine 250-250-65 MG tablet Commonly known as:  EXCEDRIN MIGRAINE Take 1 tablet by mouth every 6 (six) hours as needed for headache.   hydrochlorothiazide 25 MG tablet Commonly known as:  HYDRODIURIL Take 1 tablet (25 mg total) by mouth daily as needed (swelling in the legs ).   hydroxyurea 500 MG capsule Commonly known as:  HYDREA Take 1 capsule (500 mg total) by mouth daily. May take with food to minimize GI side effects.   LORazepam 0.5 MG tablet Commonly known as:  ATIVAN Take 1 tablet by mouth every morning and 2 tablets by mouth at bedtime as needed   metoprolol tartrate 50 MG tablet Commonly known as:  LOPRESSOR Take 1 tablet (50 mg total) by mouth 2 (two) times daily.   omeprazole 20 MG capsule Commonly known as:  PRILOSEC Take 20 mg by mouth daily.       Allergies:  Allergies  Allergen Reactions  . Penicillins Other (See Comments), Rash and Hives    Other Reaction: OTHER REACTION Other Reaction: OTHER REACTION Mouth blisters 03-29-11  . Atorvastatin Diarrhea    REACTION: diarrhe  . Ezetimibe     REACTION: nausea  . Fluvastatin Sodium Other (See Comments)    "Can't remember"  . Magnesium Hydroxide Other (See Comments)    REACTION: triggers HAs  . Meloxicam Other (See Comments)    Pt seeing auro's / spots.    . Omeprazole Magnesium     auras  . Pneumovax [Pneumococcal Polysaccharide Vaccine]     Local reaction  . Quinapril Hcl Other (See Comments)    03-29-11 "feelings of tiredness"  . Simvastatin Diarrhea    . Topamax [Topiramate] Itching  . Valsartan Itching and Other (See Comments)    REACTION: toes itch  . Vit D-Vit E-Safflower Oil Other (See Comments)    Headaches  . Carvedilol     "teeth hurt when I took it"    Past Medical History, Surgical history, Social history, and Family History were reviewed and updated.  Review of Systems: All other 10 point review of systems is negative.   Physical Exam:  weight is 148 lb 6.4 oz (67.3 kg). Her oral temperature is 98.3 F (36.8 C). Her blood pressure is 162/75 (abnormal) and her pulse is 64. Her respiration is 17 and oxygen saturation is 99%.   Wt Readings from Last 3 Encounters:  06/10/17 148 lb 6.4 oz (67.3 kg)  05/24/17 147 lb 1.3 oz (66.7 kg)  03/25/17 150 lb (68 kg)    Ocular: Sclerae unicteric, pupils equal, round and reactive to light Ear-nose-throat: Oropharynx clear, dentition fair Lymphatic: No cervical, supraclavicular or axillary adenopathy Lungs no rales or rhonchi, good excursion bilaterally Heart regular rate and rhythm, no murmur appreciated Abd soft, nontender, positive bowel sounds, no liver or spleen tip palpated on exam, no fluid wave  MSK no focal spinal tenderness, no joint edema Neuro: non-focal, well-oriented, appropriate affect Breasts: Deferred   Lab Results  Component Value Date   WBC 5.8 06/10/2017   HGB 13.9 03/25/2017   HCT 46.5 06/10/2017   MCV 91.5 06/10/2017   PLT 380 06/10/2017   Lab Results  Component Value Date   FERRITIN 31 03/25/2017   IRON 27 (L) 03/25/2017   TIBC 455 (H) 03/25/2017   UIBC 428 (H) 03/25/2017   IRONPCTSAT 6 (L) 03/25/2017   Lab Results  Component Value Date   RETICCTPCT 0.8 02/02/2015   RBC 5.08 06/10/2017   RETICCTABS 41.4 02/02/2015   No results found for: KPAFRELGTCHN, LAMBDASER, KAPLAMBRATIO No results found for: IGGSERUM, IGA, IGMSERUM No results found for: Odetta Pink, SPEI   Chemistry       Component Value Date/Time   NA 142 03/25/2017 1457   NA 139 04/10/2016 0855   K 4.1 03/25/2017 1457   K 4.2 04/10/2016 0855   CL 96 (L) 03/25/2017 1457   CO2 32 03/25/2017 1457   CO2 29 04/10/2016 0855   BUN 7 03/25/2017 1457   BUN 10.0 04/10/2016 0855   CREATININE 1.0 03/25/2017 1457   CREATININE 0.8 04/10/2016 0855      Component Value Date/Time   CALCIUM 9.8 03/25/2017 1457   CALCIUM 9.5 04/10/2016 0855   ALKPHOS 86 (H) 03/25/2017 1457   ALKPHOS 85 04/10/2016 0855   AST 22 03/25/2017 1457   AST 19 04/10/2016 0855   ALT 15 03/25/2017 1457  ALT 13 04/10/2016 0855   BILITOT 1.40 03/25/2017 1457   BILITOT 1.26 (H) 04/10/2016 0855       Impression and Plan: Ms. Miklos is a very pleasant 84 yo caucasian female with polycythemia and history of stage I infiltrating ductal carcinoma of the left breast with lumpectomy in 2013. She completed treatment with Femara in April 2017. She is doing well and so far there has been no evidence of recurrence.  She continues to do well on Hydrea and platelet count is stable at 380.  Hct is 46.5% so we will phlebotomize her today.  We will plan to see her back in another 2 months for follow-up and repeat lab work.  She will contact our office with any questions or concerns. We can certainly see her sooner if need be.   [name], NP 1/28/20192:23 PM

## 2017-06-10 NOTE — Patient Instructions (Signed)
Therapeutic Phlebotomy, Care After Refer to this sheet in the next few weeks. These instructions provide you with information about caring for yourself after your procedure. Your health care provider may also give you more specific instructions. Your treatment has been planned according to current medical practices, but problems sometimes occur. Call your health care provider if you have any problems or questions after your procedure. What can I expect after the procedure? After the procedure, it is common to have:  Light-headedness or dizziness. You may feel faint.  Nausea.  Tiredness.  Follow these instructions at home: Activity  Return to your normal activities as directed by your health care provider. Most people can go back to their normal activities right away.  Avoid strenuous physical activity and heavy lifting or pulling for about 5 hours after the procedure. Do not lift anything that is heavier than 10 lb (4.5 kg).  Athletes should avoid strenuous exercise for at least 12 hours.  Change positions slowly for the remainder of the day. This will help to prevent light-headedness or fainting.  If you feel light-headed, lie down until the feeling goes away. Eating and drinking  Be sure to eat well-balanced meals for the next 24 hours.  Drink enough fluid to keep your urine clear or pale yellow.  Avoid drinking alcohol on the day that you had the procedure. Care of the Needle Insertion Site  Keep your bandage dry. You can remove the bandage after about 5 hours or as directed by your health care provider.  If you have bleeding from the needle insertion site, elevate your arm and press firmly on the site until the bleeding stops.  If you have bruising at the site, apply ice to the area: ? Put ice in a plastic bag. ? Place a towel between your skin and the bag. ? Leave the ice on for 20 minutes, 2-3 times a day for the first 24 hours.  If the swelling does not go away after  24 hours, apply a warm, moist washcloth to the area for 20 minutes, 2-3 times a day. General instructions  Avoid smoking for at least 30 minutes after the procedure.  Keep all follow-up visits as directed by your health care provider. It is important to continue with further therapeutic phlebotomy treatments as directed. Contact a health care provider if:  You have redness, swelling, or pain at the needle insertion site.  You have fluid, blood, or pus coming from the needle insertion site.  You feel light-headed, dizzy, or nauseated, and the feeling does not go away.  You notice new bruising at the needle insertion site.  You feel weaker than normal.  You have a fever or chills. Get help right away if:  You have severe nausea or vomiting.  You have chest pain.  You have trouble breathing. This information is not intended to replace advice given to you by your health care provider. Make sure you discuss any questions you have with your health care provider. Document Released: 10/02/2010 Document Revised: 12/31/2015 Document Reviewed: 04/26/2014 Elsevier Interactive Patient Education  2018 [employer]  

## 2017-06-10 NOTE — Progress Notes (Signed)
[name] presents today for phlebotomy per MD orders. Phlebotomy procedure started at1500 and ended at 1514 and 508 grams removed; via 20 G to [name]. Patient observed for 30 minutes after procedure without any incident. Patient tolerated procedure well. IV needle removed intact.

## 2017-06-11 LAB — IRON AND TIBC
Iron: 43 ug/dL (ref 41–142)
Saturation Ratios: 10 % — ABNORMAL LOW (ref 21–57)
TIBC: 449 ug/dL — ABNORMAL HIGH (ref 236–444)
UIBC: 405 ug/dL

## 2017-06-11 LAB — FERRITIN: Ferritin: 33 ng/mL (ref 9–269)

## 2017-06-14 ENCOUNTER — Other Ambulatory Visit: Payer: Self-pay | Admitting: Hematology & Oncology

## 2017-06-14 DIAGNOSIS — Z9889 Other specified postprocedural states: Secondary | ICD-10-CM

## 2017-06-24 ENCOUNTER — Ambulatory Visit (INDEPENDENT_AMBULATORY_CARE_PROVIDER_SITE_OTHER): Payer: Medicare Other | Admitting: Internal Medicine

## 2017-06-24 ENCOUNTER — Encounter: Payer: Self-pay | Admitting: Internal Medicine

## 2017-06-24 VITALS — BP 126/68 | HR 67 | Temp 97.7°F | Resp 14 | Ht 66.0 in | Wt 152.5 lb

## 2017-06-24 DIAGNOSIS — R079 Chest pain, unspecified: Secondary | ICD-10-CM | POA: Diagnosis not present

## 2017-06-24 DIAGNOSIS — I2510 Atherosclerotic heart disease of native coronary artery without angina pectoris: Secondary | ICD-10-CM

## 2017-06-24 DIAGNOSIS — F5101 Primary insomnia: Secondary | ICD-10-CM | POA: Diagnosis not present

## 2017-06-24 DIAGNOSIS — F341 Dysthymic disorder: Secondary | ICD-10-CM | POA: Diagnosis not present

## 2017-06-24 MED ORDER — LORAZEPAM 0.5 MG PO TABS: ORAL_TABLET | ORAL | 0 refills | Status: DC

## 2017-06-24 MED ORDER — ESCITALOPRAM OXALATE 5 MG PO TABS: 5.0000 mg | ORAL_TABLET | Freq: Every day | ORAL | 1 refills | Status: DC

## 2017-06-24 NOTE — Progress Notes (Signed)
Pre visit review using our clinic review tool, if applicable. No additional management support is needed unless otherwise documented below in the visit note. 

## 2017-06-24 NOTE — Progress Notes (Signed)
Subjective:    Patient ID: [name], female    DOB: 01-30-1930, 84 y.o.   MRN: [id number]  DOS:  06/24/2017 Type of visit - description : Acute visit Interval history: Patient set up the  appointment because of a headache but when I stepped in the room she said she has not been here in a while and liked a check up. Headaches are mild, on and off and at baseline. She lost her daughter in November, obviously that was very hard on her, is needed more Ativan than usual. When asked, admits to chest pain. She is somewhat vague about her symptoms, one episode yesterday when she was at home doing her ADLs. CP located in the middle of the anterior chest, pressure-like, last 1 hour, no radiation to the neck, no associated with nausea, shortness of breath or diaphoresis. She was very vague when I asked her how often she has chest pain, said maybe 1 or twice over the last month.   Review of Systems See above Patient lives by herself, still drives.  Denies suicidal ideas  Past Medical History:  Diagnosis Date  . CAD (coronary artery disease)    mild nonobstructive disease on cath in 2003  . Cancer ([place])   . Cardiomyopathy    Probable Takotsubo, severe CP w/ normal cath in 1994. Severe CP in 2003 w/ widespread T wave inversions on ECG. Cath w/ minimal coronary disease but LV-gram showed periapical severe hypokinesis and basilar hyperkinesia (EF 40%). Last echo in 4/09 confirmed full LV functional recovery with EF 60%, no regional wall motion abnormalities, mild to moderate LVH.  Mar[place] Kitchen CVA (cerebral infarction)   . Depression with anxiety 03-29-11   lost husband 3'09  . Diverticulitis   . DVT (deep venous thrombosis) ([place])    after venous ablation, R leg  . E. coli gastroenteritis 03-29-11   8'10  . Fracture 09/10/07   L2, status post vertebroplasty of L2 performed by IR  . GERD (gastroesophageal reflux disease)   . Headache(784.0)    migraines  . Hemorrhoids   . Hiatal hernia 03-29-11    no nerve problems  . Hyperlipidemia   . Hyperplastic colon polyp 06/2001  . Hypertension   . OA (osteoarthritis) of knee 03-29-11   w/ bilateral knee pain-not a problem now  . OSA (obstructive sleep apnea) 03-29-11   no cpap used, not a problem now.  . Osteoporosis   . Otalgia of both ears    Dr. Simeon Craft  . Polycythemia   . Stroke [facility]) 03-29-11   CVA x2 -last 10'12-?TIA(visual problems)  . Varicose vein     Past Surgical History:  Procedure Laterality Date  . APPENDECTOMY    . BACK SURGERY    . BREAST BIOPSY    . BREAST CYST EXCISION    . BREAST LUMPECTOMY Left 2013   left stage I left breast cancer  . CHOLECYSTECTOMY  04/09/2011   Procedure: LAPAROSCOPIC CHOLECYSTECTOMY WITH INTRAOPERATIVE CHOLANGIOGRAM;  Surgeon: Odis Hollingshead, MD;  Location: [facility];  Service: General;  Laterality: N/A;  . Dental Extraction     L maxillary molar  . KYPHOSIS SURGERY  08/2007   cement used  . OVARIAN CYST SURGERY     left  . TONSILLECTOMY    . TOTAL KNEE ARTHROPLASTY  06/2010   left  . TUBAL LIGATION      Social History   Socioeconomic History  . Marital status: Widowed    Spouse name: Not on file  .  Number of children: 3  . Years of education: Not on file  . Highest education level: Not on file  Social Needs  . Financial resource strain: Not on file  . Food insecurity - worry: Not on file  . Food insecurity - inability: Not on file  . Transportation needs - medical: Not on file  . Transportation needs - non-medical: Not on file  Occupational History  . Occupation: n/a  Tobacco Use  . Smoking status: Former Smoker    Packs/day: 1.00    Years: 37.00    Pack years: 37.00    Types: Cigarettes    Start date: 06/26/1951    Last attempt to quit: 05/14/1988    Years since quitting: 29.1  . Smokeless tobacco: Never Used  . Tobacco comment: quit 25 years ago  Substance and Sexual Activity  . Alcohol use: Yes    Alcohol/week: 0.6 oz    Types: 1 Cans of beer per week     Comment: occasional/social  . Drug use: No  . Sexual activity: No  Other Topics Concern  . Not on file  Social History Narrative   Lost husband 07/29/07-    Lives in [place] in a town house by herself, has 3 daughters, lost Mickel Baas [phone])   Early Chars lives in [place], Margaretha [place] ( [place])     Still drives                Allergies as of 06/24/2017      Reactions   Penicillins Other (See Comments), Rash, Hives   Other Reaction: OTHER REACTION Other Reaction: OTHER REACTION Mouth blisters 03-29-11   Atorvastatin Diarrhea   REACTION: diarrhe   Ezetimibe    REACTION: nausea   Fluvastatin Sodium Other (See Comments)   "Can't remember"   Magnesium Hydroxide Other (See Comments)   REACTION: triggers HAs   Meloxicam Other (See Comments)   Pt seeing auro's / spots.     Omeprazole Magnesium    auras   Pneumovax [pneumococcal Polysaccharide Vaccine]    Local reaction   Quinapril Hcl Other (See Comments)   03-29-11 "feelings of tiredness"   Simvastatin Diarrhea   Topamax [topiramate] Itching   Valsartan Itching, Other (See Comments)   REACTION: toes itch   Vit D-vit E-safflower Oil Other (See Comments)   Headaches   Carvedilol    "teeth hurt when I took it"      Medication List        Accurate as of 06/24/17 11:59 PM. Always use your most recent med list.          acetaminophen-codeine 300-30 MG tablet Commonly known as:  TYLENOL #3 Take 1 tablet by mouth every 8 (eight) hours as needed. For migraine   aspirin 81 MG tablet Take 81 mg by mouth daily.   aspirin-acetaminophen-caffeine 250-250-65 MG tablet Commonly known as:  EXCEDRIN MIGRAINE Take 1 tablet by mouth every 6 (six) hours as needed for headache.   escitalopram 5 MG tablet Commonly known as:  LEXAPRO Take 1 tablet (5 mg total) by mouth daily.   hydrochlorothiazide 25 MG tablet Commonly known as:  HYDRODIURIL Take 1 tablet (25 mg total) by mouth daily as needed (swelling in the legs ).   hydroxyurea  500 MG capsule Commonly known as:  HYDREA Take 1 capsule (500 mg total) by mouth daily. May take with food to minimize GI side effects.   LORazepam 0.5 MG tablet Commonly known as:  ATIVAN 1/2 tablet in the  morning, 1/2 tablet in the afternoon prn  anxiety.  2 tablets by mouth at bedtime prn  insomnia   metoprolol tartrate 50 MG tablet Commonly known as:  LOPRESSOR Take 1 tablet (50 mg total) by mouth 2 (two) times daily.   omeprazole 20 MG capsule Commonly known as:  PRILOSEC Take 20 mg by mouth daily.          Objective:   Physical Exam BP 126/68 (BP Location: Left Arm, Patient Position: Sitting, Cuff Size: Small)   Pulse 67   Temp 97.7 F (36.5 C) (Oral)   Resp 14   Ht 5\' 6"  (1.676 m)   Wt 152 lb 8 oz (69.2 kg)   SpO2 97%   BMI 24.61 kg/m  General:   Well developed, well nourished . NAD.  HEENT:  Normocephalic . Face symmetric, atraumatic Lungs:  CTA B Normal respiratory effort, no intercostal retractions, no accessory muscle use. Heart: RRR,  no murmur.  no pretibial edema bilaterally  Abdomen:  Not distended, soft, non-tender. No rebound or rigidity.   Skin: Not pale. Not jaundice Neurologic:  alert & oriented X3.  Speech normal, gait appropriate for age and unassisted Psych--  Cognition and judgment appear intact.  Cooperative with normal attention span and concentration.  Behavior appropriate. slt  anxious or depressed appearing.     Assessment & Plan:   Assessment  HTN Dyslipidemia: Intolerant to most medicines including Zetia, Pravachol, Niaspan Depression, anxiety , insomnia - on ativan bid (03-2015 citalopram cause drowsiness, 04-2015 prozac diarrhea) Hem-Onc: POLYCYTEMIA-- sees hem-onc H/o breast ca  MSK: ---DJD :  Tylenol #3, uses rarely ---Osteoporosis:  T score -2.8 (02-2013): declined treatment 06-2017 ---L2 fractures, s/p vertebral plasty 2009 Migraine headaches - Tylenol #3 (rarely)  GI: GERD- intolerant to prevacid, visual  disturbances HH, diverticulitis, Colon polyps  CV: ---Cardiomyopathy Probable Takotsubo cardiomyopathy: CP w/ normal cath in 1994.   Severe CP  2003 with widespread T wave inversions on ECG.  Cath with minimal coronary disease but LV-gram showed periapical severe hypokinesis and basilar hyperkinesia (EF 40%) >>> ECHO 4/09 confirmed full LV functional recovery with EF 60%, mild LVH. 2012: Nl EF per echo, (-) Lexiscan ---Stroke / TIA 2012 Sleep apnea 2012, , not on CPAP H/oDVT, right leg after venous ablation    PLAN Chest pain, since the last visit she continue with on and off chest pain, has seen cardiology twice, declined  further eval. EKG today: Artifact in V1 otherwise normal sinus rhythm.  No acute changes. Plan: refer her back to cardiology, at this time she feels she would like to proceed with further eval Anxiety, depression, insomnia: Lost her daughter in November 2018, obviously sad about the situation  she is counseled to the best of my ability, she has increased her Ativan from 1 in the morning and 2 at bedtime to 1 AM -1pm -2.qhs Has been intolerant to SSRIs before, although she is somewhat afraid to try again we agreed to try Lexapro 5 mg, a low-dose, watch for suicidal ideas.  Recommend to continue Ativan, will split day time dose to  half tablet twice a day and 2 tabs at bedtime.  RTC 1 month also encouraged to seek counseling.   Information provided. Osteoporosis: We also briefly discussed that, again declined treatment. RTC 1 month

## 2017-06-24 NOTE — Patient Instructions (Signed)
Please come back in 1 month, make an appointment  We are sending you back to cardiology, if you have severe or persistent chest pain: Call 911   Start Lexapro 5 mg: 1 tablet every night  Ativan: Half tablet in the morning as needed Half tablet in the afternoon as needed Tablets at bedtime as needed for difficulty sleeping  Watch for suicidal ideas  Please seek counseling  SUICIDE PREVENTION:  Find Help 24/7, [place] Call our 24-hour HelpLine at 418 338 6890 or [place]: 1-800-SUICIDE   The National Suicide Prevention Lifeline: [employer]

## 2017-06-25 ENCOUNTER — Telehealth: Payer: Self-pay

## 2017-06-25 DIAGNOSIS — G4700 Insomnia, unspecified: Secondary | ICD-10-CM | POA: Insufficient documentation

## 2017-06-25 NOTE — Assessment & Plan Note (Signed)
Chest pain, since the last visit she continue with on and off chest pain, has seen cardiology twice, declined  further eval. EKG today: Artifact in V1 otherwise normal sinus rhythm.  No acute changes. Plan: refer her back to cardiology, at this time she feels she would like to proceed with further eval Anxiety, depression, insomnia: Lost her daughter in November 2018, obviously sad about the situation  she is counseled to the best of my ability, she has increased her Ativan from 1 in the morning and 2 at bedtime to 1 AM -1pm -2.qhs Has been intolerant to SSRIs before, although she is somewhat afraid to try again we agreed to try Lexapro 5 mg, a low-dose, watch for suicidal ideas.  Recommend to continue Ativan, will split day time dose to  half tablet twice a day and 2 tabs at bedtime.  RTC 1 month also encouraged to seek counseling.   Information provided. Osteoporosis: We also briefly discussed that, again declined treatment. RTC 1 month

## 2017-06-25 NOTE — Telephone Encounter (Signed)
Patient states that she seen Dr. Larose Kells yesterday and that he would like for her to have a stress test prior to a dental procedure. Patient states that she is having all of her teeth removed. Upon reviewing Dr. Larose Kells' note, states patient has been having chest pain and would like to have further evaluation that was initially declined.  Please advise if stress test can be ordered or if she needs to be seen in appointment first. Last office visit 05/24/17.

## 2017-07-01 ENCOUNTER — Emergency Department (HOSPITAL_COMMUNITY): Payer: Medicare Other

## 2017-07-01 ENCOUNTER — Encounter (HOSPITAL_COMMUNITY): Payer: Self-pay | Admitting: Emergency Medicine

## 2017-07-01 ENCOUNTER — Emergency Department (HOSPITAL_COMMUNITY)
Admission: EM | Admit: 2017-07-01 | Discharge: 2017-07-01 | Disposition: A | Discharge status: Home / Self Care | Payer: Medicare Other | Attending: Emergency Medicine | Admitting: Emergency Medicine

## 2017-07-01 ENCOUNTER — Other Ambulatory Visit: Payer: Self-pay

## 2017-07-01 DIAGNOSIS — R1013 Epigastric pain: Secondary | ICD-10-CM | POA: Insufficient documentation

## 2017-07-01 DIAGNOSIS — Z8673 Personal history of transient ischemic attack (TIA), and cerebral infarction without residual deficits: Secondary | ICD-10-CM | POA: Diagnosis not present

## 2017-07-01 DIAGNOSIS — R29810 Facial weakness: Secondary | ICD-10-CM | POA: Insufficient documentation

## 2017-07-01 DIAGNOSIS — J00 Acute nasopharyngitis [common cold]: Secondary | ICD-10-CM | POA: Insufficient documentation

## 2017-07-01 DIAGNOSIS — I10 Essential (primary) hypertension: Secondary | ICD-10-CM | POA: Diagnosis not present

## 2017-07-01 DIAGNOSIS — Z79899 Other long term (current) drug therapy: Secondary | ICD-10-CM | POA: Insufficient documentation

## 2017-07-01 DIAGNOSIS — Z7982 Long term (current) use of aspirin: Secondary | ICD-10-CM | POA: Insufficient documentation

## 2017-07-01 DIAGNOSIS — I2510 Atherosclerotic heart disease of native coronary artery without angina pectoris: Secondary | ICD-10-CM | POA: Diagnosis not present

## 2017-07-01 DIAGNOSIS — Z87891 Personal history of nicotine dependence: Secondary | ICD-10-CM | POA: Insufficient documentation

## 2017-07-01 DIAGNOSIS — R079 Chest pain, unspecified: Secondary | ICD-10-CM | POA: Diagnosis not present

## 2017-07-01 DIAGNOSIS — Z853 Personal history of malignant neoplasm of breast: Secondary | ICD-10-CM | POA: Diagnosis not present

## 2017-07-01 DIAGNOSIS — N179 Acute kidney failure, unspecified: Secondary | ICD-10-CM | POA: Diagnosis not present

## 2017-07-01 DIAGNOSIS — Z96652 Presence of left artificial knee joint: Secondary | ICD-10-CM | POA: Insufficient documentation

## 2017-07-01 DIAGNOSIS — K5730 Diverticulosis of large intestine without perforation or abscess without bleeding: Secondary | ICD-10-CM | POA: Diagnosis not present

## 2017-07-01 DIAGNOSIS — R1032 Left lower quadrant pain: Secondary | ICD-10-CM | POA: Diagnosis present

## 2017-07-01 LAB — URINALYSIS, ROUTINE W REFLEX MICROSCOPIC
Bilirubin Urine: NEGATIVE
Glucose, UA: NEGATIVE mg/dL
Hgb urine dipstick: NEGATIVE
Ketones, ur: NEGATIVE mg/dL
Leukocytes, UA: NEGATIVE
Nitrite: NEGATIVE
Protein, ur: NEGATIVE mg/dL
Specific Gravity, Urine: 1.004 — ABNORMAL LOW (ref 1.005–1.030)
pH: 7 (ref 5.0–8.0)

## 2017-07-01 LAB — COMPREHENSIVE METABOLIC PANEL
ALT: 14 U/L (ref 14–54)
AST: 20 U/L (ref 15–41)
Albumin: 4.1 g/dL (ref 3.5–5.0)
Alkaline Phosphatase: 63 U/L (ref 38–126)
Anion gap: 10 (ref 5–15)
BUN: 8 mg/dL (ref 6–20)
CO2: 27 mmol/L (ref 22–32)
Calcium: 9.4 mg/dL (ref 8.9–10.3)
Chloride: 102 mmol/L (ref 101–111)
Creatinine, Ser: 0.81 mg/dL (ref 0.44–1.00)
GFR calc Af Amer: >60 mL/min (ref >60)
GFR calc non Af Amer: >60 mL/min (ref >60)
Glucose, Bld: 118 mg/dL — ABNORMAL HIGH (ref 65–99)
Potassium: 4.8 mmol/L (ref 3.5–5.1)
Sodium: 139 mmol/L (ref 135–145)
Total Bilirubin: 1.5 mg/dL — ABNORMAL HIGH (ref 0.3–1.2)
Total Protein: 6.8 g/dL (ref 6.5–8.1)

## 2017-07-01 LAB — CBC
HCT: 40.3 % (ref 36.0–46.0)
Hemoglobin: 12.4 g/dL (ref 12.0–15.0)
MCH: 28.1 pg (ref 26.0–34.0)
MCHC: 30.8 g/dL (ref 30.0–36.0)
MCV: 91.4 fL (ref 78.0–100.0)
Platelets: 292 10*3/uL (ref 150–400)
RBC: 4.41 MIL/uL (ref 3.87–5.11)
RDW: 15.4 % (ref 11.5–15.5)
WBC: 4.7 10*3/uL (ref 4.0–10.5)

## 2017-07-01 LAB — I-STAT CHEM 8, ED
BUN: 6 mg/dL (ref 6–20)
Calcium, Ion: 1.23 mmol/L (ref 1.15–1.40)
Chloride: 98 mmol/L — ABNORMAL LOW (ref 101–111)
Creatinine, Ser: 2.3 mg/dL — ABNORMAL HIGH (ref 0.44–1.00)
Glucose, Bld: 121 mg/dL — ABNORMAL HIGH (ref 65–99)
HCT: 44 % (ref 36.0–46.0)
Hemoglobin: 15 g/dL (ref 12.0–15.0)
Potassium: 3.9 mmol/L (ref 3.5–5.1)
Sodium: 138 mmol/L (ref 135–145)
TCO2: 31 mmol/L (ref 22–32)

## 2017-07-01 LAB — I-STAT TROPONIN, ED: Troponin i, poc: 0 ng/mL (ref 0.00–0.08)

## 2017-07-01 LAB — LIPASE, BLOOD: Lipase: 29 U/L (ref 11–51)

## 2017-07-01 MED ORDER — SODIUM CHLORIDE 0.9 % IV BOLUS (SEPSIS)
1000.0000 mL | Freq: Once | INTRAVENOUS | Status: AC
  Administered 2017-07-01: 1000 mL via INTRAVENOUS

## 2017-07-01 NOTE — ED Provider Notes (Addendum)
[facility] DEPT Provider Note   CSN: [id number] Arrival date & time: 07/01/17  0947     History   Chief Complaint Chief Complaint  Patient presents with  . Eye Drainage    HPI [name] is a 84 y.o. female.  84 yo F with a cc of abdominal pain that radiated to the chest.  This occurred 3 days ago.  Happened after she had some coffee.  Lasted for about an hour.  She described this pain is sharp.  Started her left lower quadrant and radiated up into her chest.   the patient was able to drive home and the pain resolved.  She had some shortness of breath but denies diaphoresis nausea or vomiting.  Since then the patient is felt better but is concerned because she has some continued feeling of being unwell.  Has had some rhinorrhea and left eye watering.  Denies cough, fever, ongoing chest pain or abdominal pain.  She denies headache denies difficulty with ambulation moving one side of her body or any other confusion.   The history is provided by the patient.  Abdominal Pain   This is a new problem. The current episode started more than 2 days ago. The problem occurs constantly. The problem has not changed since onset.The pain is located in the LLQ. The quality of the pain is sharp and shooting. The pain is at a severity of 10/10. The pain is severe. Associated symptoms include nausea. Pertinent negatives include fever, vomiting, dysuria, headaches, arthralgias and myalgias. Nothing aggravates the symptoms. The symptoms are relieved by eating. Past workup does not include GI consult.    Past Medical History:  Diagnosis Date  . CAD (coronary artery disease)    mild nonobstructive disease on cath in 2003  . Cancer ([place])   . Cardiomyopathy    Probable Takotsubo, severe CP w/ normal cath in 1994. Severe CP in 2003 w/ widespread T wave inversions on ECG. Cath w/ minimal coronary disease but LV-gram showed periapical severe hypokinesis and basilar  hyperkinesia (EF 40%). Last echo in 4/09 confirmed full LV functional recovery with EF 60%, no regional wall motion abnormalities, mild to moderate LVH.  Marland Kitchen CVA (cerebral infarction)   . Depression with anxiety 03-29-11   lost husband 3'09  . Diverticulitis   . DVT (deep venous thrombosis) ([place])    after venous ablation, R leg  . E. coli gastroenteritis 03-29-11   8'10  . Fracture 09/10/07   L2, status post vertebroplasty of L2 performed by IR  . GERD (gastroesophageal reflux disease)   . Headache(784.0)    migraines  . Hemorrhoids   . Hiatal hernia 03-29-11   no nerve problems  . Hyperlipidemia   . Hyperplastic colon polyp 06/2001  . Hypertension   . OA (osteoarthritis) of knee 03-29-11   w/ bilateral knee pain-not a problem now  . OSA (obstructive sleep apnea) 03-29-11   no cpap used, not a problem now.  . Osteoporosis   . Otalgia of both ears    Dr. Simeon Craft  . Polycythemia   . Stroke [facility]) 03-29-11   CVA x2 -last 10'12-?TIA(visual problems)  . Varicose vein     Patient Active Problem List   Diagnosis Date Noted  . Insomnia 06/25/2017  . Mild CAD 05/24/2017  . PCP NOTES >>> 02/15/2015  . Superficial thrombophlebitis 04/06/2014  . Annual physical exam 08/18/2012  . Edema 04/14/2012  . Breast cancer ([place]) 11/05/2011  . Polycythemia 11/05/2011  .  TIA (transient ischemic attack) 01/16/2011  . CARDIOMYOPATHY 08/03/2009  . DJD -- pain mgmt 08/16/2008  . ABDOMINAL PAIN, LEFT LOWER QUADRANT 07/05/2008  . GAIT DISTURBANCE 10/21/2007  . ANXIETY DEPRESSION 10/08/2007  . Hyperlipidemia 06/25/2007  . Takotsubo cardiomyopathy 06/25/2007  . GERD and Hiatal Hernia  06/25/2007  . Essential hypertension 09/30/2006  . Osteoporosis 09/30/2006  . Migraine with aura 09/30/2006    Past Surgical History:  Procedure Laterality Date  . APPENDECTOMY    . BACK SURGERY    . BREAST BIOPSY    . BREAST CYST EXCISION    . BREAST LUMPECTOMY Left 2013   left stage I left breast cancer  .  CHOLECYSTECTOMY  04/09/2011   Procedure: LAPAROSCOPIC CHOLECYSTECTOMY WITH INTRAOPERATIVE CHOLANGIOGRAM;  Surgeon: Odis Hollingshead, MD;  Location: [facility] ORS;  Service: General;  Laterality: N/A;  . Dental Extraction     L maxillary molar  . KYPHOSIS SURGERY  08/2007   cement used  . OVARIAN CYST SURGERY     left  . TONSILLECTOMY    . TOTAL KNEE ARTHROPLASTY  06/2010   left  . TUBAL LIGATION      OB History    Gravida Para Term Preterm AB Living   3 3       3    SAB TAB Ectopic Multiple Live Births                   Home Medications    Prior to Admission medications   Medication Sig Start Date End Date Taking? Authorizing Provider  acetaminophen-codeine (TYLENOL #3) 300-30 MG tablet Take 1 tablet by mouth every 8 (eight) hours as needed. For migraine 12/05/16  Yes Paz, Alda Berthold, MD  aspirin 81 MG tablet Take 81 mg by mouth daily.   Yes [provider]  hydrochlorothiazide (HYDRODIURIL) 25 MG tablet Take 1 tablet (25 mg total) by mouth daily as needed (swelling in the legs ). 07/05/16  Yes Paz, Alda Berthold, MD  hydroxyurea (HYDREA) 500 MG capsule Take 1 capsule (500 mg total) by mouth daily. May take with food to minimize GI side effects. 10/04/16  Yes Ennever, Rudell Cobb, MD  LORazepam (ATIVAN) 0.5 MG tablet 1/2 tablet in the morning, 1/2 tablet in the afternoon prn  anxiety.  2 tablets by mouth at bedtime prn  insomnia 06/24/17  Yes Colon Branch, MD  metoprolol tartrate (LOPRESSOR) 50 MG tablet Take 1 tablet (50 mg total) by mouth 2 (two) times daily. 12/27/16  Yes Paz, Alda Berthold, MD  omeprazole (PRILOSEC) 20 MG capsule Take 20 mg by mouth daily.   Yes [provider]  escitalopram (LEXAPRO) 5 MG tablet Take 1 tablet (5 mg total) by mouth daily. Patient not taking: Reported on 07/01/2017 06/24/17   Colon Branch, MD    Family History Family History  Problem Relation Age of Onset  . Breast cancer Mother   . Heart disease Father        MIs  . Colon cancer Other        cousin  .  Breast cancer Sister   . Leukemia Brother        GM    Social History Social History   Tobacco Use  . Smoking status: Former Smoker    Packs/day: 1.00    Years: 37.00    Pack years: 37.00    Types: Cigarettes    Start date: 06/26/1949    Last attempt to quit: 05/14/1988    Years since  quitting: 29.1  . Smokeless tobacco: Never Used  . Tobacco comment: quit 25 years ago  Substance Use Topics  . Alcohol use: Yes    Alcohol/week: 0.6 oz    Types: 1 Cans of beer per week    Comment: occasional/social  . Drug use: No     Allergies   Penicillins; Atorvastatin; Ezetimibe; Fluvastatin sodium; Magnesium hydroxide; Meloxicam; Omeprazole magnesium; Pneumovax [pneumococcal polysaccharide vaccine]; Quinapril hcl; Simvastatin; Topamax [topiramate]; Valsartan; Vit d-vit e-safflower oil; and Carvedilol   Review of Systems Review of Systems  Constitutional: Negative for chills and fever.  HENT: Positive for rhinorrhea. Negative for congestion.   Eyes: Positive for discharge (left sided watery). Negative for redness and visual disturbance.  Respiratory: Positive for shortness of breath. Negative for wheezing.   Cardiovascular: Positive for chest pain. Negative for palpitations.  Gastrointestinal: Positive for abdominal pain and nausea. Negative for vomiting.  Genitourinary: Negative for dysuria and urgency.  Musculoskeletal: Negative for arthralgias and myalgias.  Skin: Negative for pallor and wound.  Neurological: Negative for dizziness and headaches.     Physical Exam Updated Vital Signs BP (!) 160/72 (BP Location: Right Arm)   Pulse 62   Temp 98 F (36.7 C) (Oral)   Resp 16   SpO2 99%   Physical Exam  Constitutional: She is oriented to person, place, and time. She appears well-developed and well-nourished. No distress.  HENT:  Head: Normocephalic and atraumatic.  Swollen turbinates  Eyes: EOM are normal. Pupils are equal, round, and reactive to light. Left eye exhibits  discharge (watery).  Neck: Normal range of motion. Neck supple.  Cardiovascular: Normal rate and regular rhythm. Exam reveals no gallop and no friction rub.  No murmur heard. Pulmonary/Chest: Effort normal. She has no wheezes. She has no rales.  Abdominal: Soft. She exhibits no distension and no mass. There is tenderness (worst to the epigastrium). There is no guarding.  Musculoskeletal: She exhibits no edema or tenderness.  Neurological: She is alert and oriented to person, place, and time. She has normal strength. No cranial nerve deficit or sensory deficit. She displays a negative Romberg sign. Coordination and gait normal. GCS eye subscore is 4. GCS verbal subscore is 5. GCS motor subscore is 6. She displays no Babinski's sign on the right side. She displays no Babinski's sign on the left side.  Reflex Scores:      Tricep reflexes are 2+ on the right side and 2+ on the left side.      Bicep reflexes are 2+ on the right side and 2+ on the left side.      Brachioradialis reflexes are 2+ on the right side and 2+ on the left side.      Patellar reflexes are 2+ on the right side and 2+ on the left side.      Achilles reflexes are 2+ on the right side and 2+ on the left side. Skin: Skin is warm and dry. She is not diaphoretic.  Psychiatric: She has a normal mood and affect. Her behavior is normal.  Nursing note and vitals reviewed.    ED Treatments / Results  Labs (all labs ordered are listed, but only abnormal results are displayed) Labs Reviewed  COMPREHENSIVE METABOLIC PANEL - Abnormal; Notable for the following components:      Result Value   Glucose, Bld 118 (*)    Total Bilirubin 1.5 (*)    All other components within normal limits  URINALYSIS, ROUTINE W REFLEX MICROSCOPIC - Abnormal; Notable for the  following components:   Color, Urine STRAW (*)    Specific Gravity, Urine 1.004 (*)    All other components within normal limits  I-STAT CHEM 8, ED - Abnormal; Notable for the  following components:   Chloride 98 (*)    Creatinine, Ser 2.30 (*)    Glucose, Bld 121 (*)    All other components within normal limits  CBC  LIPASE, BLOOD  I-STAT TROPONIN, ED    EKG  EKG Interpretation  Date/Time:  Monday July 01 2017 10:10:04 EST Ventricular Rate:  71 PR Interval:    QRS Duration: 76 QT Interval:  381 QTC Calculation: 414 R Axis:   59 Text Interpretation:  Sinus rhythm No significant change since last tracing Confirmed by Deno Etienne [phone]) on 07/01/2017 12:24:03 PM       Radiology Ct Abdomen Pelvis Wo Contrast  Result Date: 07/01/2017 CLINICAL DATA:  Acute left lower quadrant abdominal pain. EXAM: CT ABDOMEN AND PELVIS WITHOUT CONTRAST TECHNIQUE: Multidetector CT imaging of the abdomen and pelvis was performed following the standard protocol without IV contrast. COMPARISON:  CT scan of October 19, 2015. FINDINGS: Lower chest: Large sliding-type hiatal hernia is noted. Visualized lung bases are unremarkable. Hepatobiliary: No focal liver abnormality is seen. Status post cholecystectomy. No biliary dilatation. Pancreas: Unremarkable. No pancreatic ductal dilatation or surrounding inflammatory changes. Spleen: Stable calcified granulomata are noted. Adrenals/Urinary Tract: Adrenal glands are unremarkable. Kidneys are normal, without renal calculi, focal lesion, or hydronephrosis. Bladder is unremarkable. Stomach/Bowel: There is no evidence of bowel obstruction or inflammation. Status post appendectomy. Vascular/Lymphatic: Aortic atherosclerosis. No enlarged abdominal or pelvic lymph nodes. Reproductive: Calcified uterine fibroid is noted. No adnexal abnormality is noted. Other: No abdominal wall hernia or abnormality. No abdominopelvic ascites. Musculoskeletal: Status post L2 kyphoplasty. No acute abnormality is noted. IMPRESSION: Large sliding-type hiatal hernia. No hydronephrosis or renal obstruction is noted. No renal or ureteral calculi are noted. Calcified uterine  fibroid is noted. Sigmoid diverticulosis is noted without inflammation. Aortic Atherosclerosis (ICD10-I70.0). Electronically Signed   By: Marijo Conception, M.D.   On: 07/01/2017 14:21   Dg Chest 2 View  Result Date: 07/01/2017 CLINICAL DATA:  Chest pain radiating to the abdomen. EXAM: CHEST  2 VIEW COMPARISON:  02/07/2017. FINDINGS: Trachea is midline. Heart size stable. Calcified subcarinal lymph node. Lungs are hyperinflated with minimal biapical pleural thickening. No airspace consolidation or pleural fluid. Moderate hiatal hernia. Upper lumbar vertebral body augmentation. IMPRESSION: Hyperinflation without acute finding. Electronically Signed   By: Lorin Picket M.D.   On: 07/01/2017 10:45   Ct Head Wo Contrast  Result Date: 07/01/2017 CLINICAL DATA:  Facial weakness. EXAM: CT HEAD WITHOUT CONTRAST TECHNIQUE: Contiguous axial images were obtained from the base of the skull through the vertex without intravenous contrast. COMPARISON:  CT scan of October 19, 2011. FINDINGS: Brain: Mild chronic ischemic white matter disease is noted. Mild diffuse cortical atrophy is noted. Old lacunar infarction is noted in right thalamus. No mass effect or midline shift is noted. Ventricular size is within normal limits. There is no evidence of mass lesion, hemorrhage or acute infarction. Vascular: No hyperdense vessel or unexpected calcification. Skull: Normal. Negative for fracture or focal lesion. Sinuses/Orbits: No acute finding. Other: None. IMPRESSION: Mild chronic ischemic white matter disease. Mild diffuse cortical atrophy. No acute intracranial abnormality seen. Electronically Signed   By: Marijo Conception, M.D.   On: 07/01/2017 14:04    Procedures Procedures (including critical care time)  Medications Ordered in ED Medications  sodium chloride 0.9 % bolus 1,000 mL (0 mLs Intravenous Stopped 07/01/17 1502)     Initial Impression / Assessment and Plan / ED Course  I have reviewed the triage vital signs and  the nursing notes.  Pertinent labs & imaging results that were available during my care of the patient were reviewed by me and considered in my medical decision making (see chart for details).     84 yo F with a chief complaint of abdominal pain that radiates to the chest.  This started about 3 days ago.  Has resolved with patient continues to feel unwell and has some mild rhinorrhea.  Her neuro exam is unremarkable.  Clear lung sounds.  She does have some focal abdominal tenderness.  The patient has significant worsening renal function.  I obtained a CT of the abdomen pelvis without contrast.  This was essentially unremarkable.  She was found to have a fairly large jump in her creatinine.  Troponin, CT head.   I discussed the results with the patient and encouraged her to stay in the hospital for further workup for her acute kidney injury.  I am unsure of the etiology of this and discussed that with her.  I told her that further testing needed to be likely obtained including renal ultrasound, potentially a contrasted CT scan should her function continued to worsen.  I felt that dissection was a possibility with the abdominal pain and chest pain as well as a new renal insufficiency.  It would be unlikely though as the patient has no ongoing pain in her symptoms only lasted for about an hour.  The patient has a dog at home and has no one to help take care of her dog.  She states that she will call her primary care physician for a repeat renal function tests within the next couple days and possible further workup.  She states that she feels better and she is able to eat and drink without difficulty here in the ED.  She will return for any worsening symptoms.  3:04 PM:  I have discussed the diagnosis/risks/treatment options with the patient and believe the pt to be eligible for discharge home to follow-up with PCP. We also discussed returning to the ED immediately if new or worsening sx occur. We discussed  the sx which are most concerning (e.g., sudden worsening pain, fever, inability to tolerate by mouth) that necessitate immediate return. Medications administered to the patient during their visit and any new prescriptions provided to the patient are listed below.  Medications given during this visit Medications  sodium chloride 0.9 % bolus 1,000 mL (0 mLs Intravenous Stopped 07/01/17 1502)     The patient appears reasonably screen and/or stabilized for discharge and I doubt any other medical condition or other [facility] requiring further screening, evaluation, or treatment in the ED at this time prior to discharge.     Final Clinical Impressions(s) / ED Diagnoses   Final diagnoses:  Epigastric abdominal pain  Acute nasopharyngitis  AKI (acute kidney injury) [facility])    ED Discharge Orders    None           Deno Etienne, DO 07/02/17 0700

## 2017-07-01 NOTE — ED Notes (Signed)
Bed: WA21 Expected date:  Expected time:  Means of arrival:  Comments: triage

## 2017-07-01 NOTE — ED Triage Notes (Signed)
Pt verbalizes episode of pain for LLQ through mid chest to left shoulder at noon on Friday. Denies at present. Verbalizes left eye/nose watering at present. Concern for heart attack or stroke. Pt denies numbness/weakness. Face symmetrical and extremities equal/strong.

## 2017-07-22 ENCOUNTER — Ambulatory Visit (INDEPENDENT_AMBULATORY_CARE_PROVIDER_SITE_OTHER): Payer: Medicare Other | Admitting: Internal Medicine

## 2017-07-22 ENCOUNTER — Encounter: Payer: Self-pay | Admitting: Internal Medicine

## 2017-07-22 VITALS — BP 128/78 | HR 58 | Temp 97.8°F | Resp 14 | Ht 66.0 in | Wt 152.0 lb

## 2017-07-22 DIAGNOSIS — I2510 Atherosclerotic heart disease of native coronary artery without angina pectoris: Secondary | ICD-10-CM

## 2017-07-22 DIAGNOSIS — I10 Essential (primary) hypertension: Secondary | ICD-10-CM | POA: Diagnosis not present

## 2017-07-22 DIAGNOSIS — R079 Chest pain, unspecified: Secondary | ICD-10-CM | POA: Diagnosis not present

## 2017-07-22 NOTE — Progress Notes (Signed)
Subjective:    Patient ID: [name], female    DOB: 04-Jan-1930, 84 y.o.   MRN: [id number]  DOS:  07/22/2017 Type of visit - description : ED follow-up Interval history: Went to the ED 07/01/2017: Had abdominal pain with radiation to the chest  Abdominal exam was benign, CT abdomen and pelvis with no acute findings, blood work unremarkable.  Her creatinine was initially 0.8 and subsequently 2.3.   Review of Systems Since she left the ER, she denies further symptoms. She does feel somewhat tired. Appetite is very good with good p.o. tolerance without apparent postprandial pain. No fever Denies chest pain, shortness of breath, edema. No nausea, vomiting, blood in the stools, odynophagia.  Occasionally has mild diarrhea. No cough or sputum production  Past Medical History:  Diagnosis Date  . CAD (coronary artery disease)    mild nonobstructive disease on cath in 2003  . Cancer ([place])   . Cardiomyopathy    Probable Takotsubo, severe CP w/ normal cath in 1994. Severe CP in 2003 w/ widespread T wave inversions on ECG. Cath w/ minimal coronary disease but LV-gram showed periapical severe hypokinesis and basilar hyperkinesia (EF 40%). Last echo in 4/09 confirmed full LV functional recovery with EF 60%, no regional wall motion abnormalities, mild to moderate LVH.  Marland Kitchen CVA (cerebral infarction)   . Depression with anxiety 03-29-11   lost husband 3'09  . Diverticulitis   . DVT (deep venous thrombosis) ([place])    after venous ablation, R leg  . E. coli gastroenteritis 03-29-11   8'10  . Fracture 09/10/07   L2, status post vertebroplasty of L2 performed by IR  . GERD (gastroesophageal reflux disease)   . Headache(784.0)    migraines  . Hemorrhoids   . Hiatal hernia 03-29-11   no nerve problems  . Hyperlipidemia   . Hyperplastic colon polyp 06/2001  . Hypertension   . OA (osteoarthritis) of knee 03-29-11   w/ bilateral knee pain-not a problem now  . OSA (obstructive sleep apnea)  03-29-11   no cpap used, not a problem now.  . Osteoporosis   . Otalgia of both ears    Dr. Simeon Craft  . Polycythemia   . Stroke [facility]) 03-29-11   CVA x2 -last 10'12-?TIA(visual problems)  . Varicose vein     Past Surgical History:  Procedure Laterality Date  . APPENDECTOMY    . BACK SURGERY    . BREAST BIOPSY    . BREAST CYST EXCISION    . BREAST LUMPECTOMY Left 2013   left stage I left breast cancer  . CHOLECYSTECTOMY  04/09/2011   Procedure: LAPAROSCOPIC CHOLECYSTECTOMY WITH INTRAOPERATIVE CHOLANGIOGRAM;  Surgeon: Odis Hollingshead, MD;  Location: [facility] ORS;  Service: General;  Laterality: N/A;  . Dental Extraction     L maxillary molar  . KYPHOSIS SURGERY  08/2007   cement used  . OVARIAN CYST SURGERY     left  . TONSILLECTOMY    . TOTAL KNEE ARTHROPLASTY  06/2010   left  . TUBAL LIGATION      Social History   Socioeconomic History  . Marital status: Widowed    Spouse name: Not on file  . Number of children: 3  . Years of education: Not on file  . Highest education level: Not on file  Social Needs  . Financial resource strain: Not on file  . Food insecurity - worry: Not on file  . Food insecurity - inability: Not on file  . Transportation needs -  medical: Not on file  . Transportation needs - non-medical: Not on file  Occupational History  . Occupation: n/a  Tobacco Use  . Smoking status: Former Smoker    Packs/day: 1.00    Years: 37.00    Pack years: 37.00    Types: Cigarettes    Start date: 06/26/1951    Last attempt to quit: 05/14/1988    Years since quitting: 29.2  . Smokeless tobacco: Never Used  . Tobacco comment: quit 25 years ago  Substance and Sexual Activity  . Alcohol use: Yes    Alcohol/week: 0.6 oz    Types: 1 Cans of beer per week    Comment: occasional/social  . Drug use: No  . Sexual activity: No  Other Topics Concern  . Not on file  Social History Narrative   Lost husband 07/29/07-    Lives in [place] in a town house by herself,  has 3 daughters, lost Mickel Baas [phone])   Early Chars lives in [place], Margaretha [place] ( [place])     Still drives                Allergies as of 07/22/2017      Reactions   Penicillins Hives, Rash, Other (See Comments)   Has patient had a PCN reaction causing immediate rash, facial/tongue/throat swelling, SOB or lightheadedness with hypotension: Yes Has patient had a PCN reaction causing severe rash involving mucus membranes or skin necrosis: yes Has patient had a PCN reaction that required hospitalization: No Has patient had a PCN reaction occurring within the last 10 years: no If all of the above answers are "NO", then may proceed with Cephalosporin use.   Atorvastatin Diarrhea   REACTION: diarrhe   Ezetimibe    REACTION: nausea   Fluvastatin Sodium Other (See Comments)   "Can't remember"   Magnesium Hydroxide Other (See Comments)   REACTION: triggers HAs   Meloxicam Other (See Comments)   Pt seeing auro's / spots.     Omeprazole Magnesium    auras   Pneumovax [pneumococcal Polysaccharide Vaccine]    Local reaction   Quinapril Hcl Other (See Comments)   03-29-11 "feelings of tiredness"   Simvastatin Diarrhea   Topamax [topiramate] Itching   Valsartan Itching, Other (See Comments)   REACTION: toes itch   Vit D-vit E-safflower Oil Other (See Comments)   Headaches   Carvedilol    "teeth hurt when I took it"      Medication List        Accurate as of 07/22/17 11:59 PM. Always use your most recent med list.          acetaminophen-codeine 300-30 MG tablet Commonly known as:  TYLENOL #3 Take 1 tablet by mouth every 8 (eight) hours as needed. For migraine   aspirin 81 MG tablet Take 81 mg by mouth daily.   co-enzyme Q-10 30 MG capsule Take 30 mg by mouth daily.   escitalopram 5 MG tablet Commonly known as:  LEXAPRO Take 1 tablet (5 mg total) by mouth daily.   Fish Oil 1000 MG Caps Take by mouth.   FOLIC ACID PO Take by mouth.   hydrochlorothiazide 25 MG tablet Commonly  known as:  HYDRODIURIL Take 1 tablet (25 mg total) by mouth daily as needed (swelling in the legs ).   hydroxyurea 500 MG capsule Commonly known as:  HYDREA Take 1 capsule (500 mg total) by mouth daily. May take with food to minimize GI side effects.   LORazepam 0.5  MG tablet Commonly known as:  ATIVAN 1/2 tablet in the morning, 1/2 tablet in the afternoon prn  anxiety.  2 tablets by mouth at bedtime prn  insomnia   metoprolol tartrate 50 MG tablet Commonly known as:  LOPRESSOR Take 1 tablet (50 mg total) by mouth 2 (two) times daily.   omeprazole 20 MG capsule Commonly known as:  PRILOSEC Take 20 mg by mouth daily.          Objective:   Physical Exam BP 128/78 (BP Location: Left Arm, Patient Position: Sitting, Cuff Size: Normal)   Pulse (!) 58   Temp 97.8 F (36.6 C) (Oral)   Resp 14   Ht 5\' 6"  (1.676 m)   Wt 152 lb (68.9 kg)   SpO2 98%   BMI 24.53 kg/m  General:   Well developed, well nourished . NAD.  HEENT:  Normocephalic . Face symmetric, atraumatic Lungs:  CTA B Normal respiratory effort, no intercostal retractions, no accessory muscle use. Heart: RRR,  no murmur.  no pretibial edema bilaterally  Abdomen:  Not distended, soft, non-tender. No rebound or rigidity.   Skin: Not pale. Not jaundice Neurologic:  alert & oriented X3.  Speech normal, gait appropriate for age and unassisted Psych--  Cognition and judgment appear intact.  Cooperative with normal attention span and concentration.  Behavior appropriate. No anxious or depressed appearing.     Assessment & Plan:     Assessment  HTN Dyslipidemia: Intolerant to most medicines including Zetia, Pravachol, Niaspan Depression, anxiety , insomnia - on ativan bid (03-2015 citalopram cause drowsiness, 04-2015 prozac diarrhea) Hem-Onc: POLYCYTEMIA-- sees hem-onc H/o breast ca  MSK: ---DJD :  Tylenol #3, uses rarely ---Osteoporosis:  T score -2.8 (02-2013): declined treatment 06-2017 ---L2 fractures,  s/p vertebral plasty 2009 Migraine headaches - Tylenol #3 (rarely)  GI: GERD- intolerant to prevacid, visual disturbances HH, diverticulitis, Colon polyps  CV: ---Cardiomyopathy Probable Takotsubo cardiomyopathy: CP w/ normal cath in 1994.   Severe CP  2003 with widespread T wave inversions on ECG.  Cath with minimal coronary disease but LV-gram showed periapical severe hypokinesis and basilar hyperkinesia (EF 40%) >>> ECHO 4/09 confirmed full LV functional recovery with EF 60%, mild LVH. 2012: Nl EF per echo, (-) Lexiscan ---Stroke / TIA 2012 Sleep apnea 2012, , not on CPAP H/oDVT, right leg after venous ablation    PLAN Chest pain, abdominal pain: Workup in the ER negative, no further symptoms.  Observation HTN: Seems well controlled with metoprolol.  Takes HCTZ as needed for edema.  Last creatinine slightly elevated, will recheck today.  Patient reports good p.o. tolerance, good hydration and denies taking NSAIDs. RTC 3-4 months, sooner if needed.

## 2017-07-22 NOTE — Progress Notes (Signed)
Pre visit review using our clinic review tool, if applicable. No additional management support is needed unless otherwise documented below in the visit note. 

## 2017-07-22 NOTE — Patient Instructions (Signed)
GO TO THE LAB : Get the blood work     GO TO THE FRONT DESK Schedule your next appointment for a check up in 3 or 4 months, sooner if needed

## 2017-07-23 LAB — BASIC METABOLIC PANEL
BUN: 10 mg/dL (ref 6–23)
CO2: 30 mEq/L (ref 19–32)
Calcium: 9.7 mg/dL (ref 8.4–10.5)
Chloride: 100 mEq/L (ref 96–112)
Creatinine, Ser: 0.68 mg/dL (ref 0.40–1.20)
GFR: 87.2 mL/min (ref >60.00)
Glucose, Bld: 101 mg/dL — ABNORMAL HIGH (ref 70–99)
Potassium: 4.4 mEq/L (ref 3.5–5.1)
Sodium: 137 mEq/L (ref 135–145)

## 2017-07-23 NOTE — Assessment & Plan Note (Signed)
Chest pain, abdominal pain: Workup in the ER negative, no further symptoms.  Observation HTN: Seems well controlled with metoprolol.  Takes HCTZ as needed for edema.  Last creatinine slightly elevated, will recheck today.  Patient reports good p.o. tolerance, good hydration and denies taking NSAIDs. RTC 3-4 months, sooner if needed.

## 2017-08-05 IMAGING — CR DG CHEST 2V
2 series · 2 of 2 positions shown · non-contrast
Comparison: Chest radiograph 11/19/2011.

CLINICAL DATA: Short of breath. Chest discomfort. Onset of symptoms
today. Former cigarette smoker.

EXAM:
CHEST  2 VIEW

[w chest pa]
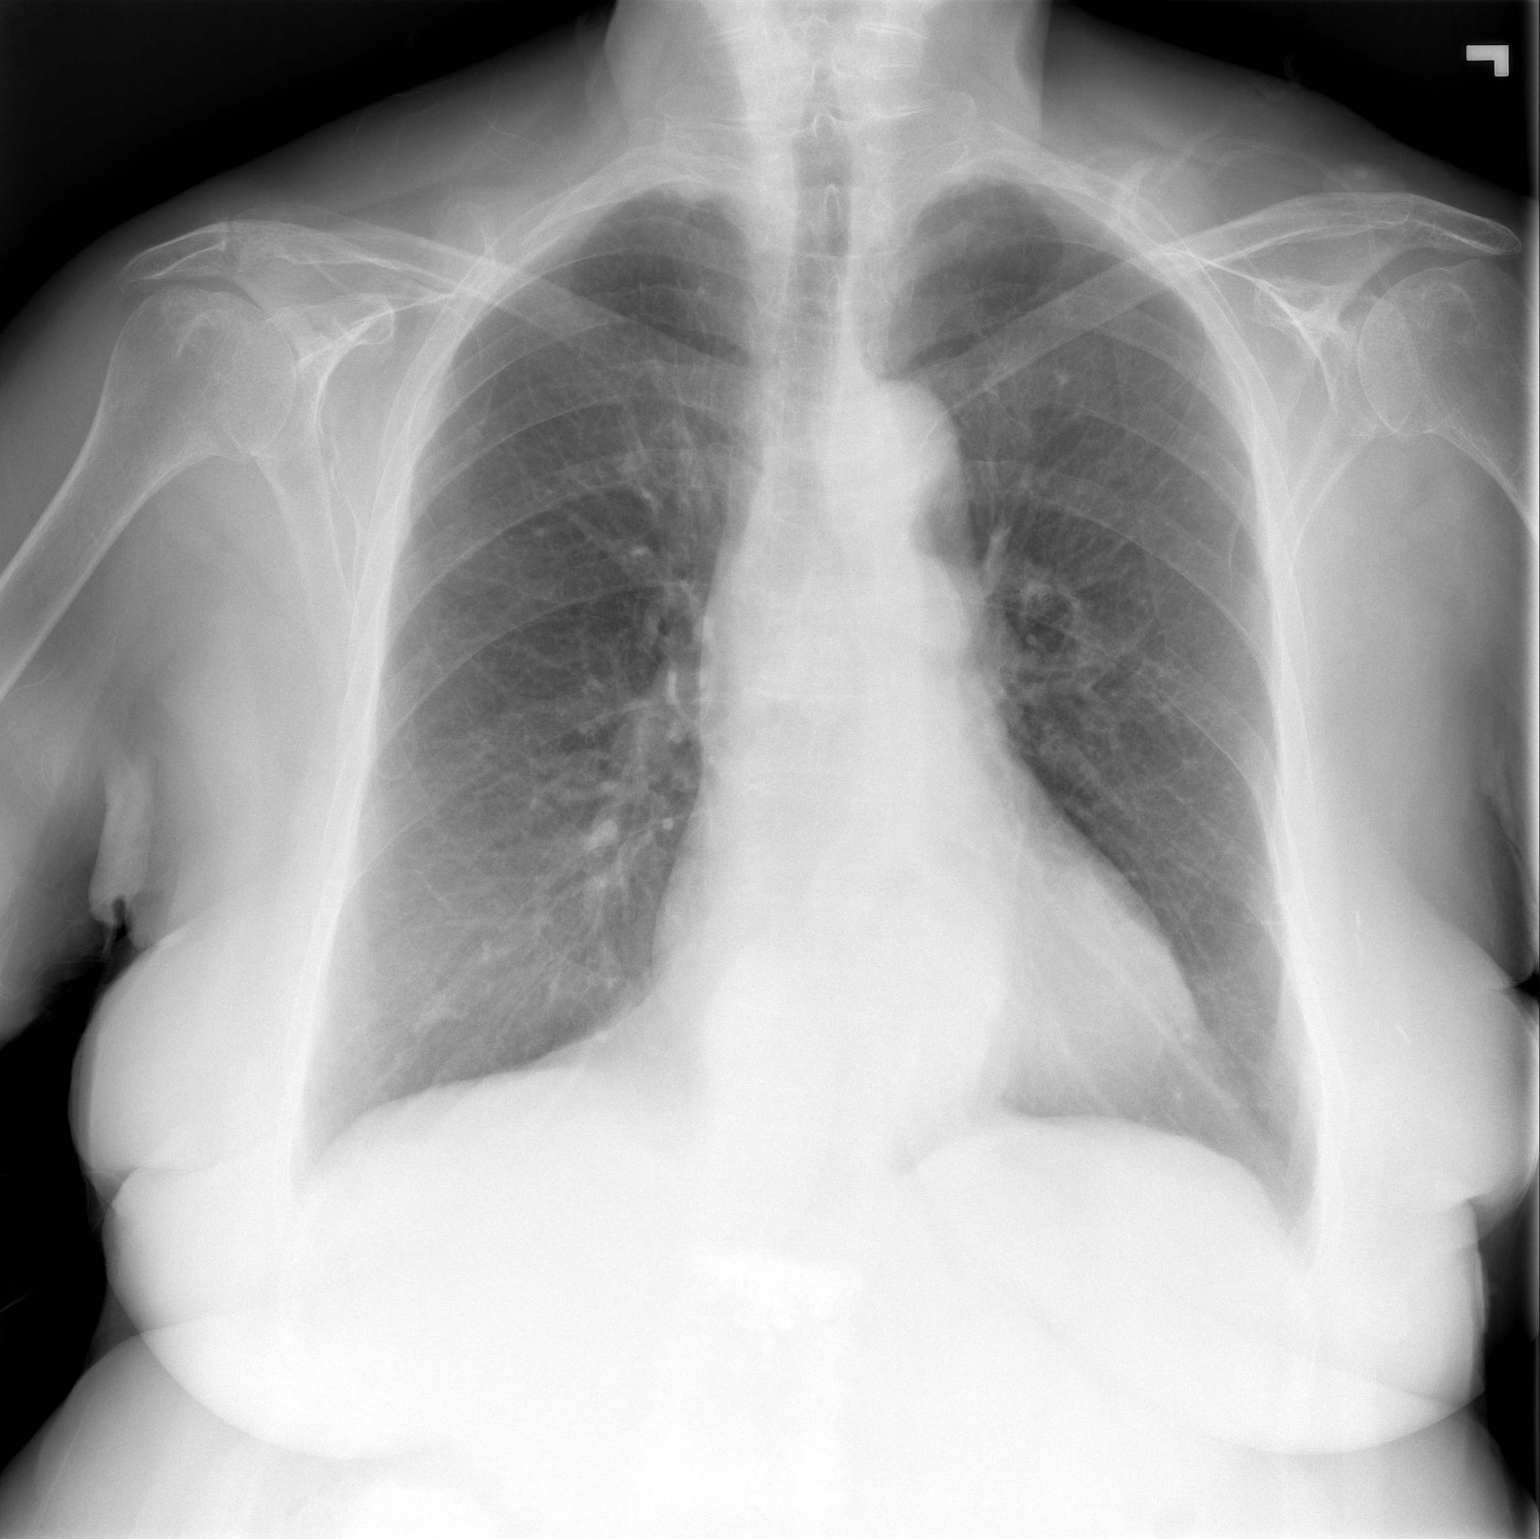

[w chest lat]
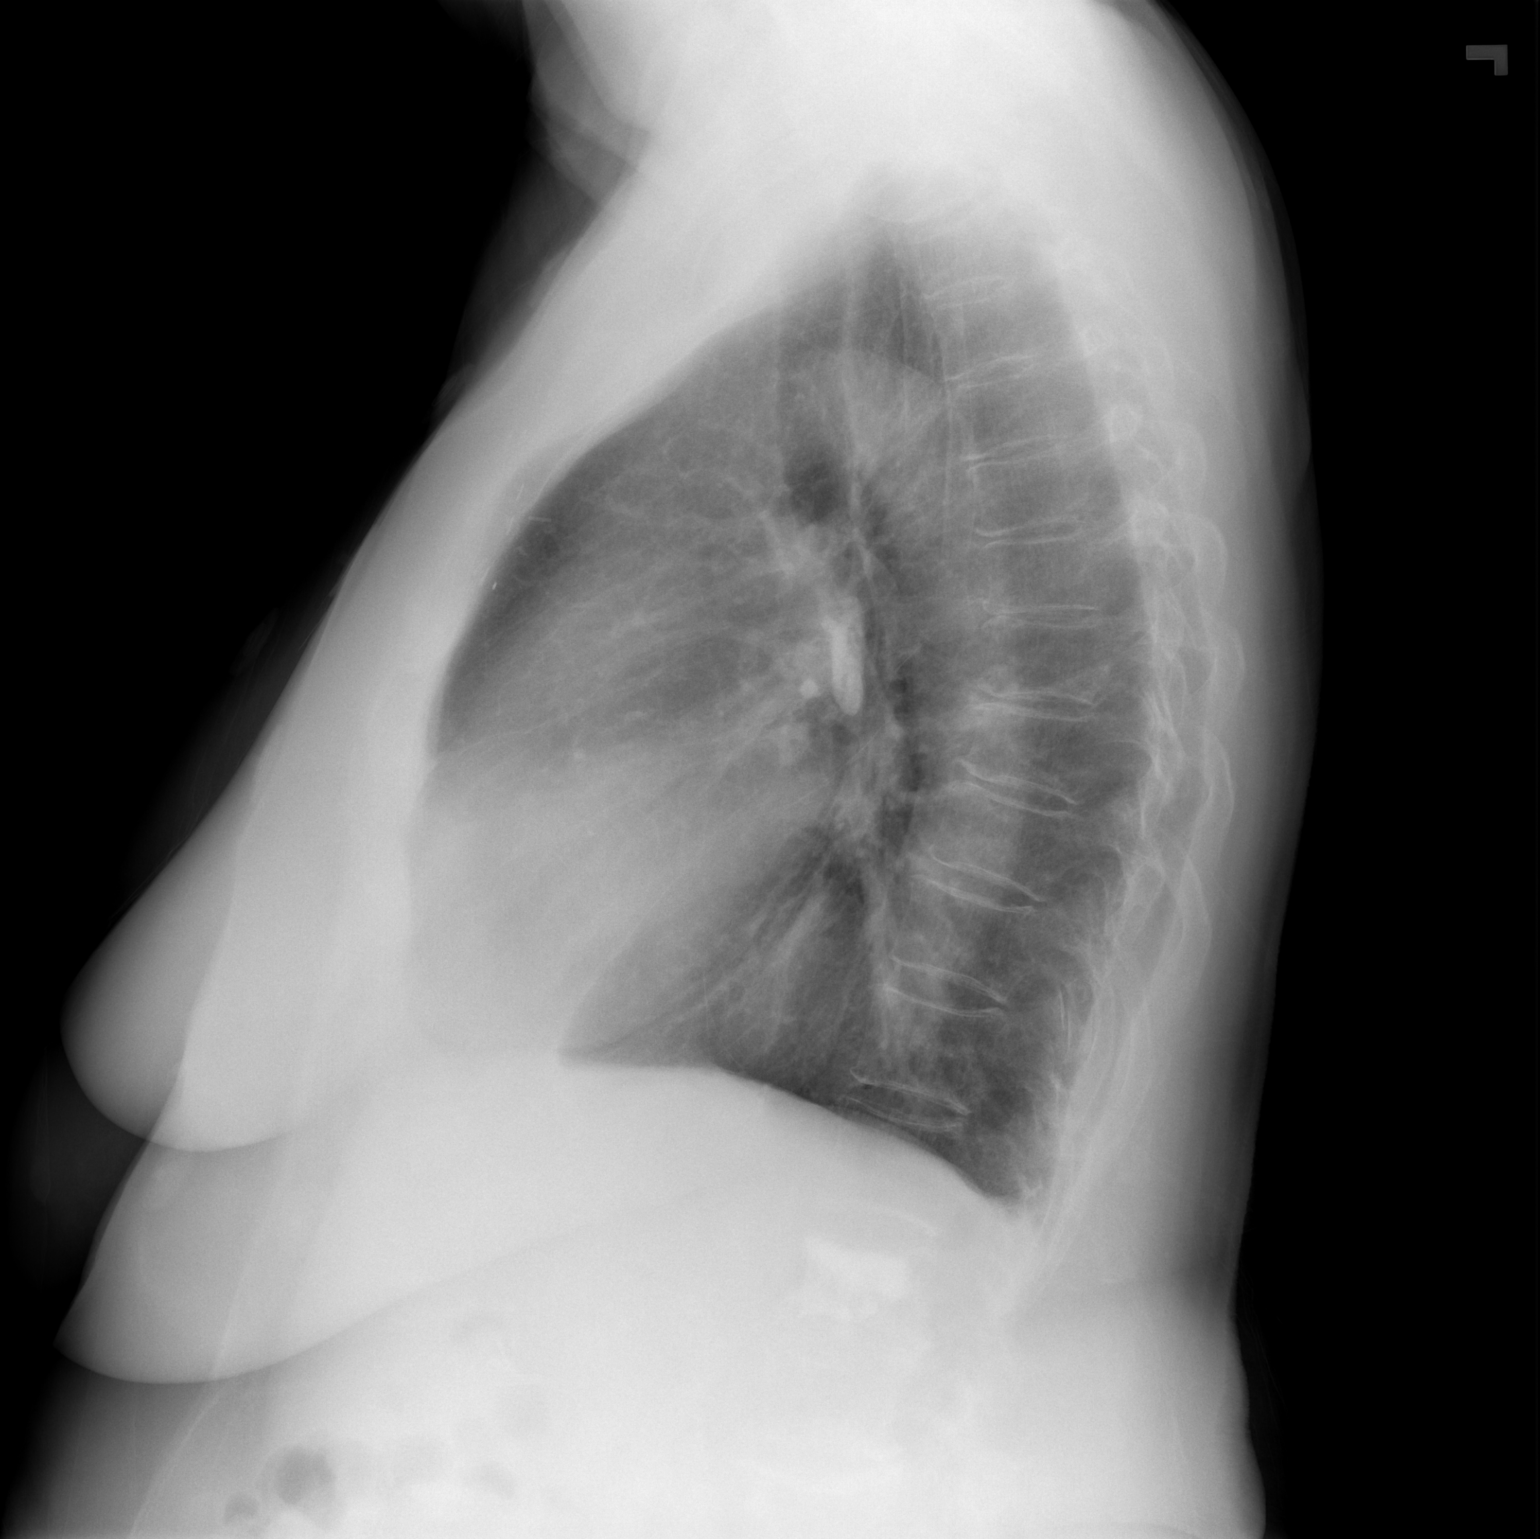

[2 of 2 positions shown; findings below may reference images not displayed]

FINDINGS: Blunting of the costophrenic angles is present on the lateral
projection. This is a chronic finding and probably associated with
emphysema. No airspace disease or pleural effusion. Moderate hiatal
hernia. Mediastinal contours appear within normal limits. Partially
radiopaque monitoring button projects over the RIGHT upper lobe.
Right-greater-than-left AC joint osteoarthritis. Thoracolumbar
vertebral augmentation visible on the lateral projection.
IMPRESSION: No active cardiopulmonary disease.

## 2017-08-05 IMAGING — CT CT ANGIO CHEST
2 of 6 series · 19 of 36 positions shown · IV contrast (APPLIED)
Comparison: Chest x-ray dated 01/20/2015

CLINICAL DATA: Increasing shortness of breath.  Hypertension.

EXAM:
CT ANGIOGRAPHY CHEST WITH CONTRAST
TECHNIQUE: Multidetector CT imaging of the chest was performed using the
standard protocol during bolus administration of intravenous
contrast. Multiplanar CT image reconstructions and MIPs were
obtained to evaluate the vascular anatomy.
CONTRAST:  100mL OMNIPAQUE IOHEXOL 350 MG/ML SOLN

[Series 5: pe 1.0 b26f · axial · 0.62mm/px · z∈[+1176,+1446]mm · 18 of 302 slices shown]
[im 16/302  lung]
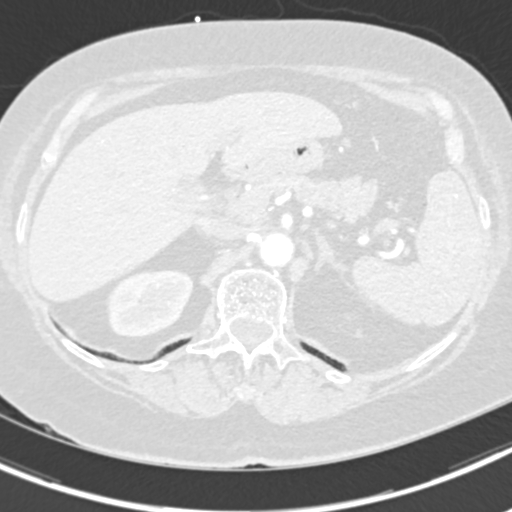
[im 31/302  mediastinal]
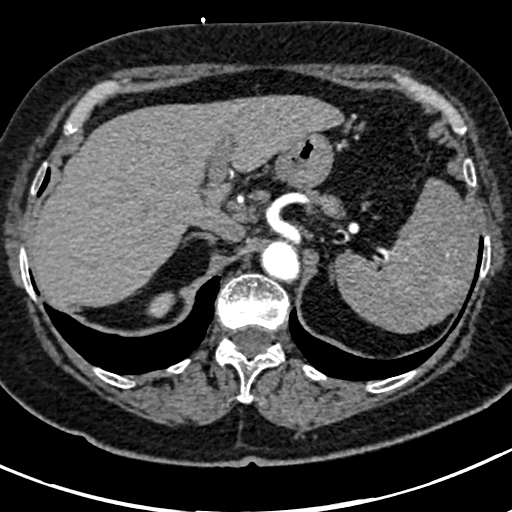
[im 46/302  lung]
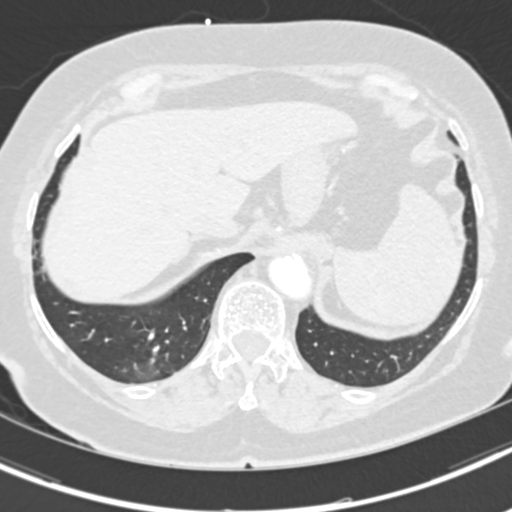
[im 61/302  mediastinal]
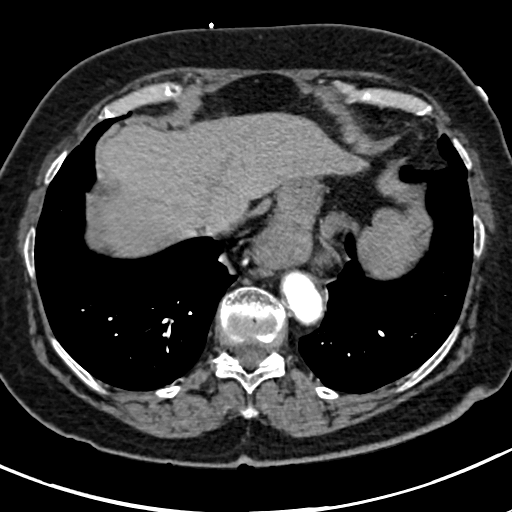
[im 76/302  lung]
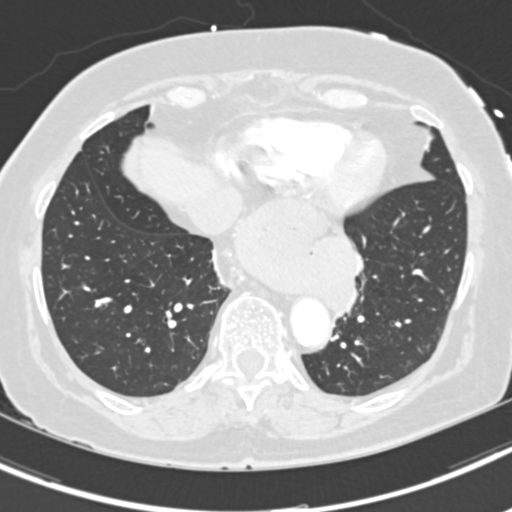
[im 91/302  mediastinal]
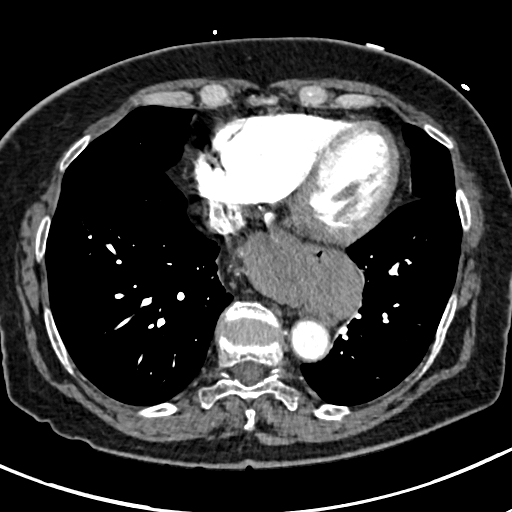
[im 106/302  lung]
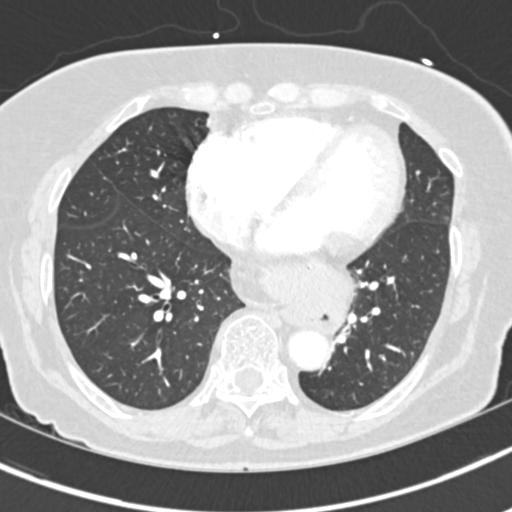
[im 121/302  mediastinal]
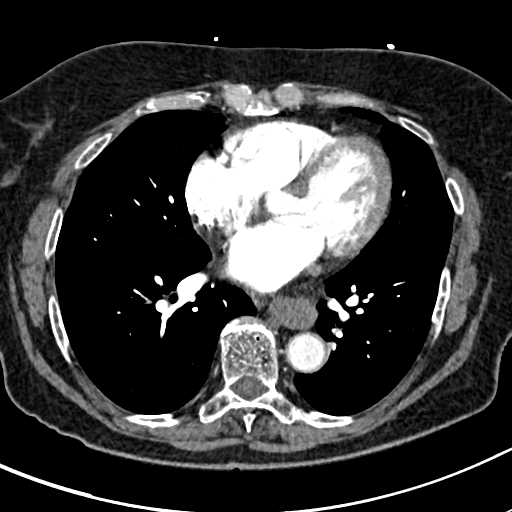
[im 136/302  lung]
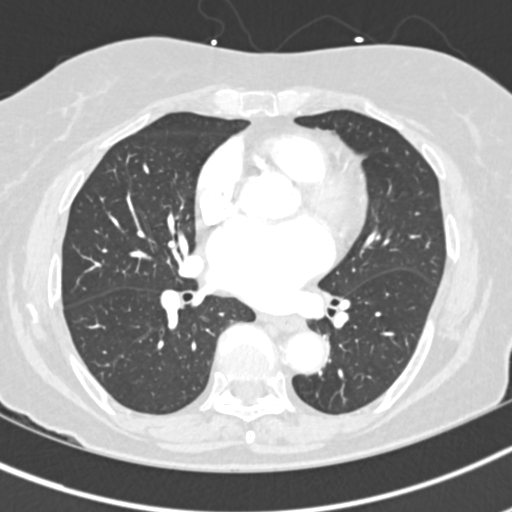
[im 166/302  mediastinal]
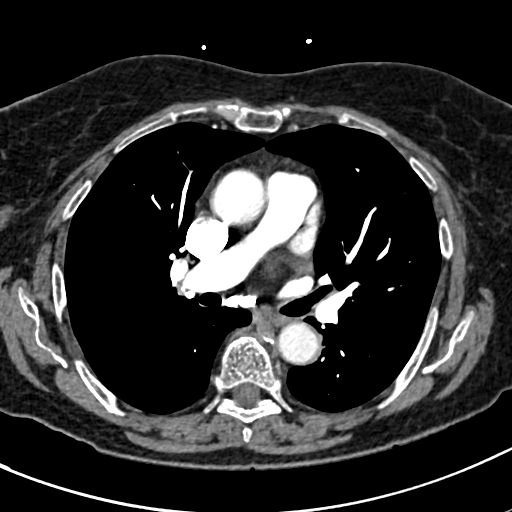
[im 181/302  lung]
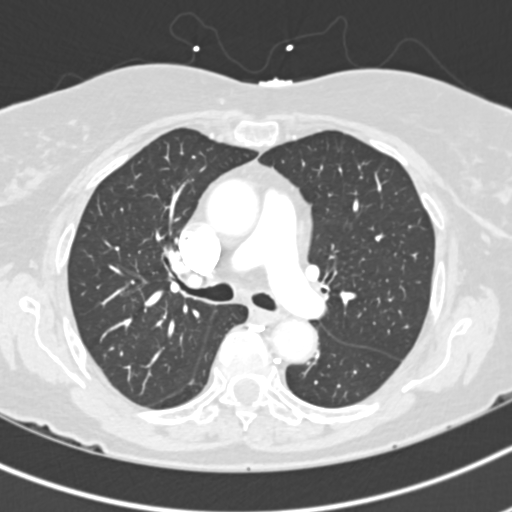
[im 196/302  mediastinal]
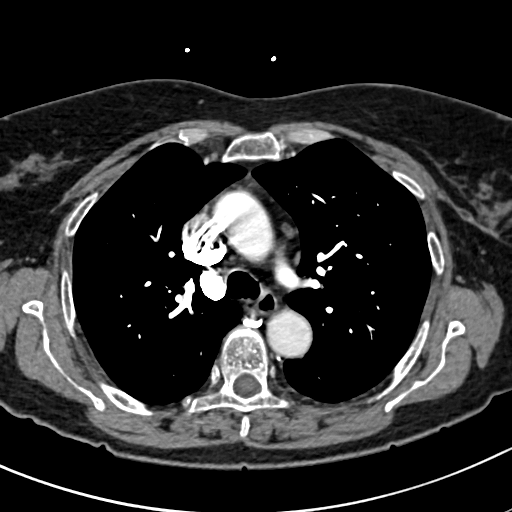
[im 211/302  lung]
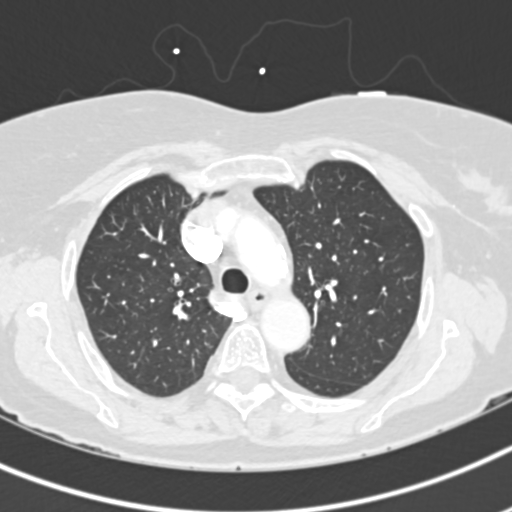
[im 226/302  mediastinal]
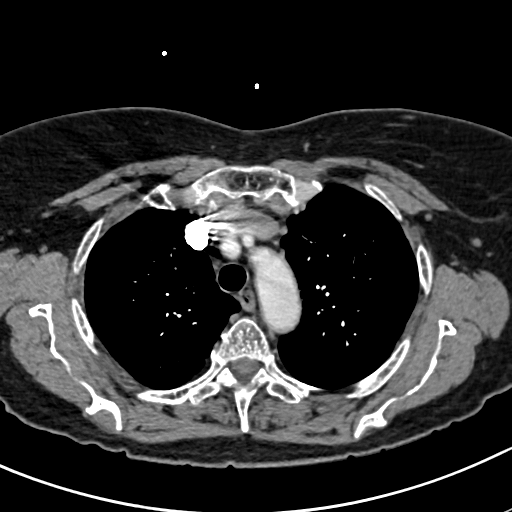
[im 241/302  lung]
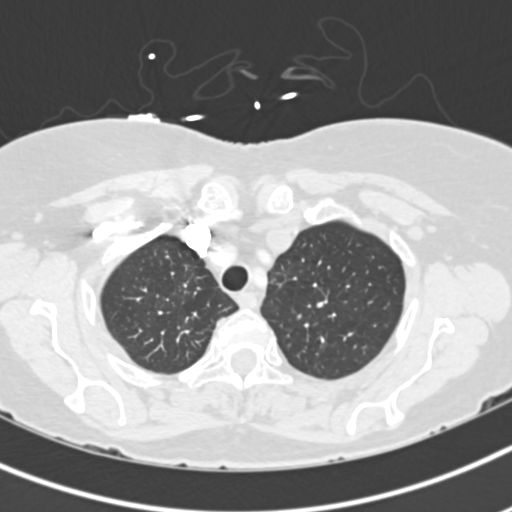
[im 256/302  mediastinal]
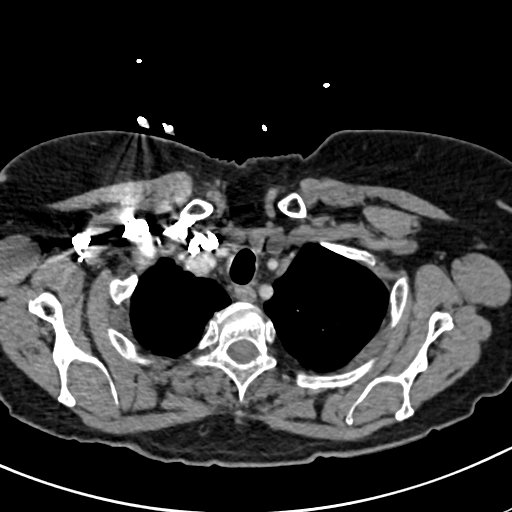
[im 271/302  lung]
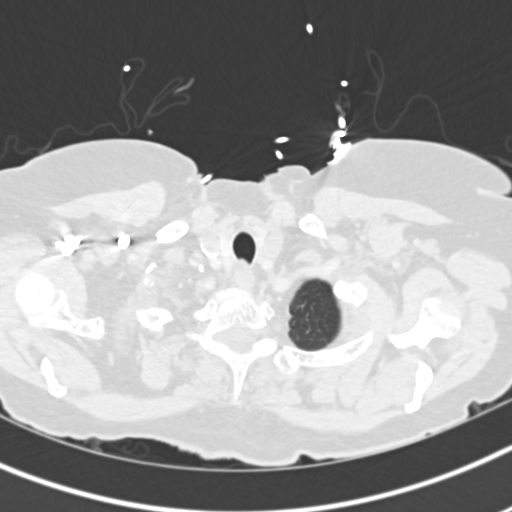
[im 286/302  mediastinal]
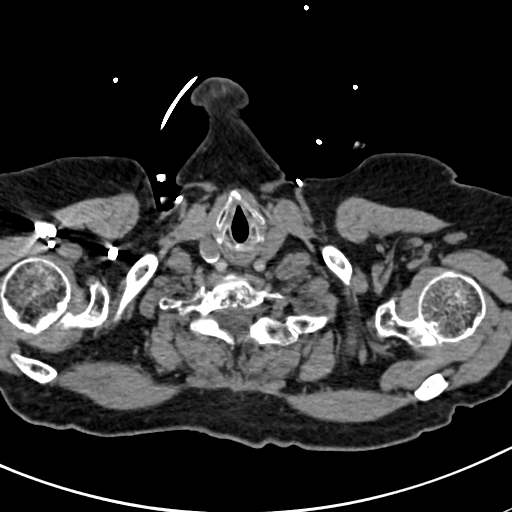

[Series 8: pe 2.0 coronal · coronal · 0.69mm/px · 1 of 139 slices shown]
[im 70/139  mediastinal]
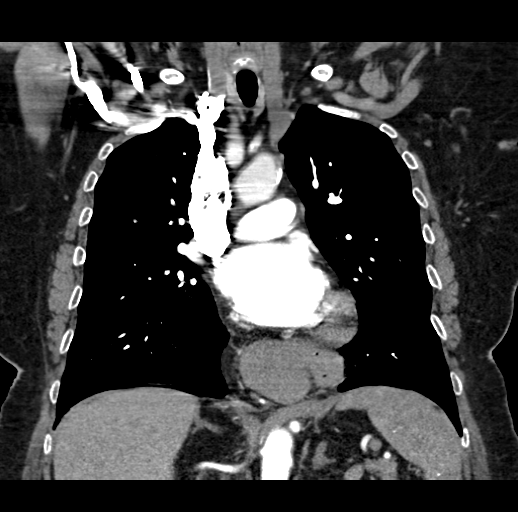

[19 of 36 positions shown; findings below may reference images not displayed]

FINDINGS: There are no pulmonary emboli, infiltrates, effusions, or other
acute abnormalities. Tiny calcified granuloma in the right lower
lobe. Multiple calcified lymph nodes in the mediastinum consistent
with previous granulomatous disease. No hilar or mediastinal
adenopathy. Heart size is normal. There is fairly extensive coronary
artery calcification.

There is a large hiatal hernia.  No acute osseous abnormality.

Review of the MIP images confirms the above findings.
IMPRESSION: 1. No pulmonary emboli.
2. Large hiatal hernia.
3. Extensive coronary artery calcification.  Aortic atherosclerosis.

## 2017-08-08 ENCOUNTER — Inpatient Hospital Stay: Payer: Medicare Other

## 2017-08-08 ENCOUNTER — Inpatient Hospital Stay: Payer: Medicare Other | Attending: Hematology & Oncology | Admitting: Hematology & Oncology

## 2017-08-08 VITALS — BP 152/56 | HR 51 | Temp 97.6°F | Resp 17 | Wt 152.1 lb

## 2017-08-08 DIAGNOSIS — D751 Secondary polycythemia: Secondary | ICD-10-CM

## 2017-08-08 DIAGNOSIS — D500 Iron deficiency anemia secondary to blood loss (chronic): Secondary | ICD-10-CM

## 2017-08-08 DIAGNOSIS — Z853 Personal history of malignant neoplasm of breast: Secondary | ICD-10-CM

## 2017-08-08 DIAGNOSIS — D45 Polycythemia vera: Secondary | ICD-10-CM | POA: Diagnosis not present

## 2017-08-08 LAB — CBC WITH DIFFERENTIAL (CANCER CENTER ONLY)
Basophils Absolute: 0.1 10*3/uL (ref 0.0–0.1)
Basophils Relative: 1 %
Eosinophils Absolute: 0.1 10*3/uL (ref 0.0–0.5)
Eosinophils Relative: 2 %
HCT: 40.2 % (ref 34.8–46.6)
Hemoglobin: 12.4 g/dL (ref 11.6–15.9)
Lymphocytes Relative: 20 %
Lymphs Abs: 1.1 10*3/uL (ref 0.9–3.3)
MCH: 28.5 pg (ref 26.0–34.0)
MCHC: 30.8 g/dL — ABNORMAL LOW (ref 32.0–36.0)
MCV: 92.4 fL (ref 81.0–101.0)
Monocytes Absolute: 0.6 10*3/uL (ref 0.1–0.9)
Monocytes Relative: 12 %
Neutro Abs: 3.5 10*3/uL (ref 1.5–6.5)
Neutrophils Relative %: 65 %
Platelet Count: 361 10*3/uL (ref 145–400)
RBC: 4.35 MIL/uL (ref 3.70–5.32)
RDW: 14.4 % (ref 11.1–15.7)
WBC Count: 5.4 10*3/uL (ref 3.9–10.0)

## 2017-08-08 LAB — CMP (CANCER CENTER ONLY)
ALT: 19 U/L (ref 10–47)
AST: 23 U/L (ref 11–38)
Albumin: 3.8 g/dL (ref 3.5–5.0)
Alkaline Phosphatase: 60 U/L (ref 26–84)
Anion gap: 9 (ref 5–15)
BUN: 10 mg/dL (ref 7–22)
CO2: 31 mmol/L (ref 18–33)
Calcium: 9.1 mg/dL (ref 8.0–10.3)
Chloride: 100 mmol/L (ref 98–108)
Creatinine: 1.1 mg/dL (ref 0.60–1.20)
Glucose, Bld: 108 mg/dL (ref 73–118)
Potassium: 4.4 mmol/L (ref 3.3–4.7)
Sodium: 140 mmol/L (ref 128–145)
Total Bilirubin: 1.6 mg/dL (ref 0.2–1.6)
Total Protein: 6.6 g/dL (ref 6.4–8.1)

## 2017-08-08 LAB — LACTATE DEHYDROGENASE: LDH: 139 U/L (ref 125–245)

## 2017-08-08 NOTE — Progress Notes (Signed)
Hematology and Oncology Follow Up Visit  [name] [id number] 01/11/1930 84 y.o. 08/08/2017   Principle Diagnosis:  Polycythemia vera - JAK2 positive Stage I (T1bN0M0 ) infiltrating duct carcinoma the left breast   Past Therapy: Femara 2.5 mg by mouth daily - stopped 08/2015  Current Therapy:   Phlebotomy to maintain hematocrit below 45% Hydrea 500 mg by mouth every day - start on 03/25/2017   Interim History:  [name] is here today for follow-up.  She actually had her teeth taken out from the maxillary area earlier this week.  She already has a denture placed.  She had no problems with the teeth extractions.  He had been off Hydrea.  She now is back on Hydrea.  Otherwise, she really has had no problems.  She has had no issues with headache.  There is been no rashes.  She is had no nausea or vomiting.  There is been no change in bowel or bladder habits.  Her appetite is doing okay.  She has had not as much to eat since her teeth were taken out.  She is having some pain where she had the extractions.  Overall, I said her performance status is ECOG 1.  Medications:  Allergies as of 08/08/2017      Reactions   Penicillins Hives, Rash, Other (See Comments)   Has patient had a PCN reaction causing immediate rash, facial/tongue/throat swelling, SOB or lightheadedness with hypotension: Yes Has patient had a PCN reaction causing severe rash involving mucus membranes or skin necrosis: yes Has patient had a PCN reaction that required hospitalization: No Has patient had a PCN reaction occurring within the last 10 years: no If all of the above answers are "NO", then may proceed with Cephalosporin use.   Atorvastatin Diarrhea   REACTION: diarrhe   Ezetimibe    REACTION: nausea   Fluvastatin Sodium Other (See Comments)   "Can't remember"   Magnesium Hydroxide Other (See Comments)   REACTION: triggers HAs   Meloxicam Other (See Comments)   Pt seeing auro's / spots.     Omeprazole Magnesium    auras   Pneumovax [pneumococcal Polysaccharide Vaccine]    Local reaction   Quinapril Hcl Other (See Comments)   03-29-11 "feelings of tiredness"   Simvastatin Diarrhea   Topamax [topiramate] Itching   Valsartan Itching, Other (See Comments)   REACTION: toes itch   Vit D-vit E-safflower Oil Other (See Comments)   Headaches   Carvedilol    "teeth hurt when I took it"      Medication List        Accurate as of 08/08/17  1:57 PM. Always use your most recent med list.          acetaminophen-codeine 300-30 MG tablet Commonly known as:  TYLENOL #3 Take 1 tablet by mouth every 8 (eight) hours as needed. For migraine   aspirin 81 MG tablet Take 81 mg by mouth daily.   co-enzyme Q-10 30 MG capsule Take 30 mg by mouth daily.   escitalopram 5 MG tablet Commonly known as:  LEXAPRO Take 1 tablet (5 mg total) by mouth daily.   Fish Oil 1000 MG Caps Take by mouth.   FOLIC ACID PO Take by mouth.   hydrochlorothiazide 25 MG tablet Commonly known as:  HYDRODIURIL Take 1 tablet (25 mg total) by mouth daily as needed (swelling in the legs ).   hydroxyurea 500 MG capsule Commonly known as:  HYDREA Take 1 capsule (500 mg total)  by mouth daily. May take with food to minimize GI side effects.   LORazepam 0.5 MG tablet Commonly known as:  ATIVAN 1/2 tablet in the morning, 1/2 tablet in the afternoon prn  anxiety.  2 tablets by mouth at bedtime prn  insomnia   metoprolol tartrate 50 MG tablet Commonly known as:  LOPRESSOR Take 1 tablet (50 mg total) by mouth 2 (two) times daily.   omeprazole 20 MG capsule Commonly known as:  PRILOSEC Take 20 mg by mouth daily.       Allergies:  Allergies  Allergen Reactions  . Penicillins Hives, Rash and Other (See Comments)    Has patient had a PCN reaction causing immediate rash, facial/tongue/throat swelling, SOB or lightheadedness with hypotension: Yes Has patient had a PCN reaction causing severe rash  involving mucus membranes or skin necrosis: yes Has patient had a PCN reaction that required hospitalization: No Has patient had a PCN reaction occurring within the last 10 years: no If all of the above answers are "NO", then may proceed with Cephalosporin use.   . Atorvastatin Diarrhea    REACTION: diarrhe  . Ezetimibe     REACTION: nausea  . Fluvastatin Sodium Other (See Comments)    "Can't remember"  . Magnesium Hydroxide Other (See Comments)    REACTION: triggers HAs  . Meloxicam Other (See Comments)    Pt seeing auro's / spots.    . Omeprazole Magnesium     auras  . Pneumovax [Pneumococcal Polysaccharide Vaccine]     Local reaction  . Quinapril Hcl Other (See Comments)    03-29-11 "feelings of tiredness"  . Simvastatin Diarrhea  . Topamax [Topiramate] Itching  . Valsartan Itching and Other (See Comments)    REACTION: toes itch  . Vit D-Vit E-Safflower Oil Other (See Comments)    Headaches  . Carvedilol     "teeth hurt when I took it"    Past Medical History, Surgical history, Social history, and Family History were reviewed and updated.  Review of Systems: Review of Systems  Constitutional: Negative.   HENT: Negative.   Eyes: Negative.   Respiratory: Negative.   Cardiovascular: Negative.   Gastrointestinal: Negative.   Genitourinary: Negative.   Musculoskeletal: Negative.   Skin: Negative.   Neurological: Negative.   Endo/Heme/Allergies: Negative.   Psychiatric/Behavioral: Negative.      Physical Exam:  vitals were not taken for this visit.   Wt Readings from Last 3 Encounters:  07/22/17 152 lb (68.9 kg)  06/24/17 152 lb 8 oz (69.2 kg)  06/10/17 148 lb 6.4 oz (67.3 kg)    Physical Exam  Constitutional: She is oriented to person, place, and time.  HENT:  Head: Normocephalic and atraumatic.  Mouth/Throat: Oropharynx is clear and moist.  Eyes: Pupils are equal, round, and reactive to light. EOM are normal.  Neck: Normal range of motion.    Cardiovascular: Normal rate, regular rhythm and normal heart sounds.  Pulmonary/Chest: Effort normal and breath sounds normal.  Abdominal: Soft. Bowel sounds are normal.  Musculoskeletal: Normal range of motion. She exhibits no edema, tenderness or deformity.  Lymphadenopathy:    She has no cervical adenopathy.  Neurological: She is alert and oriented to person, place, and time.  Skin: Skin is warm and dry. No rash noted. No erythema.  Psychiatric: She has a normal mood and affect. Her behavior is normal. Judgment and thought content normal.  Vitals reviewed.    Lab Results  Component Value Date   WBC 5.4 08/08/2017  HGB 15.0 07/01/2017   HCT 40.2 08/08/2017   MCV 92.4 08/08/2017   PLT 361 08/08/2017   Lab Results  Component Value Date   FERRITIN 33 06/10/2017   IRON 43 06/10/2017   TIBC 449 (H) 06/10/2017   UIBC 405 06/10/2017   IRONPCTSAT 10 (L) 06/10/2017   Lab Results  Component Value Date   RETICCTPCT 0.8 02/02/2015   RBC 4.35 08/08/2017   RETICCTABS 41.4 02/02/2015   No results found for: KPAFRELGTCHN, LAMBDASER, KAPLAMBRATIO No results found for: IGGSERUM, IGA, IGMSERUM No results found for: Odetta Pink, SPEI   Chemistry      Component Value Date/Time   NA 140 08/08/2017 1254   NA 142 03/25/2017 1457   NA 139 04/10/2016 0855   K 4.4 08/08/2017 1254   K 4.1 03/25/2017 1457   K 4.2 04/10/2016 0855   CL 100 08/08/2017 1254   CL 96 (L) 03/25/2017 1457   CO2 31 08/08/2017 1254   CO2 32 03/25/2017 1457   CO2 29 04/10/2016 0855   BUN 10 08/08/2017 1254   BUN 7 03/25/2017 1457   BUN 10.0 04/10/2016 0855   CREATININE 1.10 08/08/2017 1254   CREATININE 1.0 03/25/2017 1457   CREATININE 0.8 04/10/2016 0855      Component Value Date/Time   CALCIUM 9.1 08/08/2017 1254   CALCIUM 9.8 03/25/2017 1457   CALCIUM 9.5 04/10/2016 0855   ALKPHOS 60 08/08/2017 1254   ALKPHOS 86 (H) 03/25/2017 1457   ALKPHOS 85  04/10/2016 0855   AST 23 08/08/2017 1254   AST 19 04/10/2016 0855   ALT 19 08/08/2017 1254   ALT 15 03/25/2017 1457   ALT 13 04/10/2016 0855   BILITOT 1.6 08/08/2017 1254   BILITOT 1.26 (H) 04/10/2016 0855       Impression and Plan: Ms. Sasaki is a very pleasant 84 yo caucasian female with polycythemia and history of stage I infiltrating ductal carcinoma of the left breast with lumpectomy in 2013.   She completed treatment with Femara in April 2017.  Thankfully, she will not need to be phlebotomized today.  I am sure that having her teeth extracted might have been a factor.  We will plan to get her back in 3 months.  I think this would be reasonable.  Volanda Napoleon, MD 3/28/20191:57 PM

## 2017-08-09 LAB — IRON AND TIBC
Iron: 40 ug/dL — ABNORMAL LOW (ref 41–142)
Saturation Ratios: 10 % — ABNORMAL LOW (ref 21–57)
TIBC: 400 ug/dL (ref 236–444)
UIBC: 360 ug/dL

## 2017-08-09 LAB — FERRITIN: Ferritin: 25 ng/mL (ref 9–269)

## 2017-09-02 ENCOUNTER — Ambulatory Visit (INDEPENDENT_AMBULATORY_CARE_PROVIDER_SITE_OTHER): Payer: Medicare Other | Admitting: Internal Medicine

## 2017-09-02 ENCOUNTER — Encounter: Payer: Self-pay | Admitting: Internal Medicine

## 2017-09-02 VITALS — BP 126/74 | HR 61 | Temp 97.4°F | Resp 16 | Ht 66.0 in | Wt 150.4 lb

## 2017-09-02 DIAGNOSIS — R6889 Other general symptoms and signs: Secondary | ICD-10-CM

## 2017-09-02 DIAGNOSIS — R6883 Chills (without fever): Secondary | ICD-10-CM

## 2017-09-02 DIAGNOSIS — I2510 Atherosclerotic heart disease of native coronary artery without angina pectoris: Secondary | ICD-10-CM

## 2017-09-02 LAB — URINALYSIS, ROUTINE W REFLEX MICROSCOPIC
Bilirubin Urine: NEGATIVE
Hgb urine dipstick: NEGATIVE
Ketones, ur: NEGATIVE
Leukocytes, UA: NEGATIVE
Nitrite: NEGATIVE
Specific Gravity, Urine: <=1.005 — AB (ref 1.000–1.030)
Total Protein, Urine: NEGATIVE
Urine Glucose: NEGATIVE
Urobilinogen, UA: 0.2 (ref 0.0–1.0)
pH: 6 (ref 5.0–8.0)

## 2017-09-02 LAB — TSH: TSH: 1.74 u[IU]/mL (ref 0.35–4.50)

## 2017-09-02 NOTE — Progress Notes (Signed)
Pre visit review using our clinic review tool, if applicable. No additional management support is needed unless otherwise documented below in the visit note. 

## 2017-09-02 NOTE — Patient Instructions (Addendum)
GO TO THE LAB : Get the blood work    Please come back in 6 weeks if you are not improving

## 2017-09-02 NOTE — Progress Notes (Signed)
Subjective:    Patient ID: [name], female    DOB: Dec 17, 1929, 84 y.o.   MRN: [id number]  DOS:  09/02/2017 Type of visit - description : Acute, chief complaint "not feeling good". Interval history: Patient states that she is not feeling well for the last several weeks, she feels is intolerant to cold, question of a low-grade fever although she has never take her temperature. She had dental work and took antibiotics but both symptoms preceded the dental work.  Wt Readings from Last 3 Encounters:  09/02/17 150 lb 6 oz (68.2 kg)  08/08/17 152 lb 2 oz (69 kg)  07/22/17 152 lb (68.9 kg)    BP Readings from Last 3 Encounters:  09/02/17 126/74  08/08/17 (!) 152/56  07/22/17 128/78    Review of Systems  Denies rash, myalgias or arthralgias, minimal weight loss per our scales Denies anxiety, feels depressed from time to time but he states "that is not the reason I feel this way".  She  never tried Lexapro. Denies headache, nausea, diarrhea or blood in the stools No dysuria or gross hematuria No chest pain no cough No rash Denies fatigue, "I go and go all the time".  Past Medical History:  Diagnosis Date  . CAD (coronary artery disease)    mild nonobstructive disease on cath in 2003  . Cancer ([place])   . Cardiomyopathy    Probable Takotsubo, severe CP w/ normal cath in 1994. Severe CP in 2003 w/ widespread T wave inversions on ECG. Cath w/ minimal coronary disease but LV-gram showed periapical severe hypokinesis and basilar hyperkinesia (EF 40%). Last echo in 4/09 confirmed full LV functional recovery with EF 60%, no regional wall motion abnormalities, mild to moderate LVH.  Marland Kitchen CVA (cerebral infarction)   . Depression with anxiety 03-29-11   lost husband 3'09  . Diverticulitis   . DVT (deep venous thrombosis) ([place])    after venous ablation, R leg  . E. coli gastroenteritis 03-29-11   8'10  . Fracture 09/10/07   L2, status post vertebroplasty of L2 performed by IR  . GERD  (gastroesophageal reflux disease)   . Headache(784.0)    migraines  . Hemorrhoids   . Hiatal hernia 03-29-11   no nerve problems  . Hyperlipidemia   . Hyperplastic colon polyp 06/2001  . Hypertension   . OA (osteoarthritis) of knee 03-29-11   w/ bilateral knee pain-not a problem now  . OSA (obstructive sleep apnea) 03-29-11   no cpap used, not a problem now.  . Osteoporosis   . Otalgia of both ears    Dr. Simeon Craft  . Polycythemia   . Stroke [facility]) 03-29-11   CVA x2 -last 10'12-?TIA(visual problems)  . Varicose vein     Past Surgical History:  Procedure Laterality Date  . APPENDECTOMY    . BACK SURGERY    . BREAST BIOPSY    . BREAST CYST EXCISION    . BREAST LUMPECTOMY Left 2013   left stage I left breast cancer  . CHOLECYSTECTOMY  04/09/2011   Procedure: LAPAROSCOPIC CHOLECYSTECTOMY WITH INTRAOPERATIVE CHOLANGIOGRAM;  Surgeon: Odis Hollingshead, MD;  Location: [facility];  Service: General;  Laterality: N/A;  . Dental Extraction     L maxillary molar  . KYPHOSIS SURGERY  08/2007   cement used  . OVARIAN CYST SURGERY     left  . TONSILLECTOMY    . TOTAL KNEE ARTHROPLASTY  06/2010   left  . TUBAL LIGATION  Social History   Socioeconomic History  . Marital status: Widowed    Spouse name: Not on file  . Number of children: 3  . Years of education: Not on file  . Highest education level: Not on file  Occupational History  . Occupation: n/a  Social Needs  . Financial resource strain: Not on file  . Food insecurity:    Worry: Not on file    Inability: Not on file  . Transportation needs:    Medical: Not on file    Non-medical: Not on file  Tobacco Use  . Smoking status: Former Smoker    Packs/day: 1.00    Years: 37.00    Pack years: 37.00    Types: Cigarettes    Start date: 06/26/1951    Last attempt to quit: 05/14/1988    Years since quitting: 29.3  . Smokeless tobacco: Never Used  . Tobacco comment: quit 25 years ago  Substance and Sexual Activity  . Alcohol  use: Yes    Alcohol/week: 0.6 oz    Types: 1 Cans of beer per week    Comment: occasional/social  . Drug use: No  . Sexual activity: Never  Lifestyle  . Physical activity:    Days per week: Not on file    Minutes per session: Not on file  . Stress: Not on file  Relationships  . Social connections:    Talks on phone: Not on file    Gets together: Not on file    Attends religious service: Not on file    Active member of club or organization: Not on file    Attends meetings of clubs or organizations: Not on file    Relationship status: Not on file  . Intimate partner violence:    Fear of current or ex partner: Not on file    Emotionally abused: Not on file    Physically abused: Not on file    Forced sexual activity: Not on file  Other Topics Concern  . Not on file  Social History Narrative   Lost husband 07/29/07-    Lives in [place] in a town house by herself, has 3 daughters, lost Mickel Baas [phone])   Early Chars lives in [place], Margaretha [place] ( [place])     Still drives                Allergies as of 09/02/2017      Reactions   Penicillins Hives, Rash, Other (See Comments)   Has patient had a PCN reaction causing immediate rash, facial/tongue/throat swelling, SOB or lightheadedness with hypotension: Yes Has patient had a PCN reaction causing severe rash involving mucus membranes or skin necrosis: yes Has patient had a PCN reaction that required hospitalization: No Has patient had a PCN reaction occurring within the last 10 years: no If all of the above answers are "NO", then may proceed with Cephalosporin use.   Atorvastatin Diarrhea   REACTION: diarrhe   Ezetimibe    REACTION: nausea   Fluvastatin Sodium Other (See Comments)   "Can't remember"   Magnesium Hydroxide Other (See Comments)   REACTION: triggers HAs   Meloxicam Other (See Comments)   Pt seeing auro's / spots.     Omeprazole Magnesium    auras   Pneumovax [pneumococcal Polysaccharide Vaccine]    Local  reaction   Quinapril Hcl Other (See Comments)   03-29-11 "feelings of tiredness"   Simvastatin Diarrhea   Topamax [topiramate] Itching   Valsartan Itching, Other (See Comments)  REACTION: toes itch   Vit D-vit E-safflower Oil Other (See Comments)   Headaches   Carvedilol    "teeth hurt when I took it"      Medication List        Accurate as of 09/02/17  9:12 PM. Always use your most recent med list.          acetaminophen-codeine 300-30 MG tablet Commonly known as:  TYLENOL #3 Take 1 tablet by mouth every 8 (eight) hours as needed. For migraine   aspirin 81 MG tablet Take 81 mg by mouth daily.   co-enzyme Q-10 30 MG capsule Take 30 mg by mouth daily.   Fish Oil 1000 MG Caps Take by mouth.   FOLIC ACID PO Take by mouth.   hydrochlorothiazide 25 MG tablet Commonly known as:  HYDRODIURIL Take 1 tablet (25 mg total) by mouth daily as needed (swelling in the legs ).   hydroxyurea 500 MG capsule Commonly known as:  HYDREA Take 1 capsule (500 mg total) by mouth daily. May take with food to minimize GI side effects.   LORazepam 0.5 MG tablet Commonly known as:  ATIVAN 1/2 tablet in the morning, 1/2 tablet in the afternoon prn  anxiety.  2 tablets by mouth at bedtime prn  insomnia   metoprolol tartrate 50 MG tablet Commonly known as:  LOPRESSOR Take 1 tablet (50 mg total) by mouth 2 (two) times daily.   omeprazole 20 MG capsule Commonly known as:  PRILOSEC Take 20 mg by mouth daily.          Objective:   Physical Exam BP 126/74 (BP Location: Left Arm, Patient Position: Sitting, Cuff Size: Small)   Pulse 61   Temp (!) 97.4 F (36.3 C) (Oral)   Resp 16   Ht 5\' 6"  (1.676 m)   Wt 150 lb 6 oz (68.2 kg)   SpO2 97%   BMI 24.27 kg/m  General:   Well developed, well nourished . NAD.  HEENT:  Normocephalic . Face symmetric, atraumatic. Neck: No thyromegaly, no LADs, no supra clavicular mass. Lungs:  CTA B Normal respiratory effort, no intercostal  retractions, no accessory muscle use. Heart: RRR,  no murmur.  no pretibial edema bilaterally  Abdomen:  Not distended, soft, non-tender. No rebound or rigidity.  No organomegaly Skin: Not pale. Not jaundice Neurologic:  alert & oriented X3.  Speech normal, gait appropriate for age and unassisted Psych--  Cognition and judgment appear intact.  Cooperative with normal attention span and concentration.  Behavior appropriate. No anxious or depressed appearing.     Assessment & Plan:   Assessment  HTN Dyslipidemia: Intolerant to most medicines including Zetia, Pravachol, Niaspan Depression, anxiety , insomnia - on ativan bid (03-2015 citalopram cause drowsiness, 04-2015 prozac diarrhea) Hem-Onc: POLYCYTEMIA-- sees hem-onc H/o breast ca  MSK: ---DJD :  Tylenol #3, uses rarely ---Osteoporosis:  T score -2.8 (02-2013): declined treatment 06-2017 ---L2 fractures, s/p vertebral plasty 2009 Migraine headaches - Tylenol #3 (rarely)  GI: GERD- intolerant to prevacid, visual disturbances HH, diverticulitis, Colon polyps  CV: ---Cardiomyopathy Probable Takotsubo cardiomyopathy: CP w/ normal cath in 1994.   Severe CP  2003 with widespread T wave inversions on ECG.  Cath with minimal coronary disease but LV-gram showed periapical severe hypokinesis and basilar hyperkinesia (EF 40%) >>> ECHO 4/09 confirmed full LV functional recovery with EF 60%, mild LVH. 2012: Nl EF per echo, (-) Lexiscan ---Stroke / TIA 2012 Sleep apnea 2012, , not on CPAP H/oDVT, right leg after  venous ablation    PLAN Cold intolerance, low-grade fever: ROS w/ no red flags, physical exam is benign, she recently had dental work but symptoms preceded that. Recent labs satisfactory, check a TSH, UA urine culture.  Otherwise observation, RTC 6 weeks if she is not improving, call if symptoms increase. Polycythemia: On hydroxyurea, not taking folic acid because side effects ("auras").  Recommend to discuss with  hematology Anxiety depression insomnia: See visit from 06-2017, never took Lexapro, cost was an issue.  Denies the need to take medication at this point. RTC 6 weeks if not improving

## 2017-09-02 NOTE — Assessment & Plan Note (Signed)
Cold intolerance, low-grade fever: ROS w/ no red flags, physical exam is benign, she recently had dental work but symptoms preceded that. Recent labs satisfactory, check a TSH, UA urine culture.  Otherwise observation, RTC 6 weeks if she is not improving, call if symptoms increase. Polycythemia: On hydroxyurea, not taking folic acid because side effects ("auras").  Recommend to discuss with hematology Anxiety depression insomnia: See visit from 06-2017, never took Lexapro, cost was an issue.  Denies the need to take medication at this point. RTC 6 weeks if not improving

## 2017-09-03 LAB — URINE CULTURE
MICRO NUMBER:: 90489463
Result:: NO GROWTH
SPECIMEN QUALITY:: ADEQUATE

## 2017-09-08 ENCOUNTER — Other Ambulatory Visit: Payer: Self-pay | Admitting: Hematology & Oncology

## 2017-09-10 ENCOUNTER — Telehealth: Payer: Self-pay | Admitting: Internal Medicine

## 2017-09-10 NOTE — Telephone Encounter (Signed)
Pt is requesting refill on lorazepam.   Last OV: 09/02/2017 Last Fill: 06/24/2017 #90 and 0RF UDS: 03/18/2017 Low risk  NCCR printed- 07/22/2017- no discrepancies noted- in media  Please advise.

## 2017-09-11 NOTE — Telephone Encounter (Signed)
Sent!

## 2017-10-28 ENCOUNTER — Other Ambulatory Visit: Payer: Self-pay | Admitting: Internal Medicine

## 2017-10-30 DIAGNOSIS — I83893 Varicose veins of bilateral lower extremities with other complications: Secondary | ICD-10-CM | POA: Diagnosis not present

## 2017-10-30 DIAGNOSIS — I8311 Varicose veins of right lower extremity with inflammation: Secondary | ICD-10-CM | POA: Diagnosis not present

## 2017-10-30 DIAGNOSIS — I8312 Varicose veins of left lower extremity with inflammation: Secondary | ICD-10-CM | POA: Diagnosis not present

## 2017-11-07 ENCOUNTER — Other Ambulatory Visit: Payer: Self-pay

## 2017-11-07 ENCOUNTER — Inpatient Hospital Stay: Payer: Medicare Other | Attending: Hematology & Oncology | Admitting: Hematology & Oncology

## 2017-11-07 ENCOUNTER — Inpatient Hospital Stay: Payer: Medicare Other

## 2017-11-07 ENCOUNTER — Telehealth: Payer: Self-pay | Admitting: Internal Medicine

## 2017-11-07 ENCOUNTER — Encounter: Payer: Self-pay | Admitting: Hematology & Oncology

## 2017-11-07 VITALS — BP 136/68 | HR 66 | Temp 98.5°F | Resp 16 | Wt 151.0 lb

## 2017-11-07 DIAGNOSIS — D45 Polycythemia vera: Secondary | ICD-10-CM | POA: Insufficient documentation

## 2017-11-07 DIAGNOSIS — D500 Iron deficiency anemia secondary to blood loss (chronic): Secondary | ICD-10-CM

## 2017-11-07 DIAGNOSIS — D751 Secondary polycythemia: Secondary | ICD-10-CM

## 2017-11-07 DIAGNOSIS — Z853 Personal history of malignant neoplasm of breast: Secondary | ICD-10-CM | POA: Diagnosis not present

## 2017-11-07 DIAGNOSIS — C50011 Malignant neoplasm of nipple and areola, right female breast: Secondary | ICD-10-CM

## 2017-11-07 LAB — CBC WITH DIFFERENTIAL (CANCER CENTER ONLY)
Basophils Absolute: 0.1 10*3/uL (ref 0.0–0.1)
Basophils Relative: 1 %
Eosinophils Absolute: 0.1 10*3/uL (ref 0.0–0.5)
Eosinophils Relative: 2 %
HCT: 41.7 % (ref 34.8–46.6)
Hemoglobin: 12.9 g/dL (ref 11.6–15.9)
Lymphocytes Relative: 16 %
Lymphs Abs: 1 10*3/uL (ref 0.9–3.3)
MCH: 26.9 pg (ref 26.0–34.0)
MCHC: 30.9 g/dL — ABNORMAL LOW (ref 32.0–36.0)
MCV: 87.1 fL (ref 81.0–101.0)
Monocytes Absolute: 0.6 10*3/uL (ref 0.1–0.9)
Monocytes Relative: 10 %
Neutro Abs: 4.4 10*3/uL (ref 1.5–6.5)
Neutrophils Relative %: 71 %
Platelet Count: 396 10*3/uL (ref 145–400)
RBC: 4.79 MIL/uL (ref 3.70–5.32)
RDW: 15.5 % (ref 11.1–15.7)
WBC Count: 6.2 10*3/uL (ref 3.9–10.0)

## 2017-11-07 LAB — CMP (CANCER CENTER ONLY)
ALT: 15 U/L (ref 10–47)
AST: 26 U/L (ref 11–38)
Albumin: 3.6 g/dL (ref 3.5–5.0)
Alkaline Phosphatase: 57 U/L (ref 26–84)
Anion gap: 7 (ref 5–15)
BUN: 7 mg/dL (ref 7–22)
CO2: 32 mmol/L (ref 18–33)
Calcium: 9.1 mg/dL (ref 8.0–10.3)
Chloride: 98 mmol/L (ref 98–108)
Creatinine: 0.7 mg/dL (ref 0.60–1.20)
Glucose, Bld: 110 mg/dL (ref 73–118)
Potassium: 4 mmol/L (ref 3.3–4.7)
Sodium: 137 mmol/L (ref 128–145)
Total Bilirubin: 1.6 mg/dL (ref 0.2–1.6)
Total Protein: 6.7 g/dL (ref 6.4–8.1)

## 2017-11-07 LAB — LACTATE DEHYDROGENASE: LDH: 179 U/L (ref 98–192)

## 2017-11-07 MED ORDER — METRONIDAZOLE 500 MG PO TABS: 500.0000 mg | ORAL_TABLET | Freq: Three times a day (TID) | ORAL | 0 refills | Status: DC

## 2017-11-07 NOTE — Telephone Encounter (Signed)
Sent!

## 2017-11-07 NOTE — Progress Notes (Signed)
Hematology and Oncology Follow Up Visit  [name] [id number] 02-17-1930 84 y.o. 11/07/2017   Principle Diagnosis:  Polycythemia vera - JAK2 positive Stage I (T1bN0M0 ) infiltrating duct carcinoma the left breast   Past Therapy: Femara 2.5 mg by mouth daily - stopped 08/2015  Current Therapy:   Phlebotomy to maintain hematocrit below 45% Hydrea 500 mg by mouth every day - start on 03/25/2017   Interim History:  [name] is here today for follow-up.  She is not feeling all that well.  She feels that she has a fever.  Her temperature today is 98.5.  She is having this abdominal pain.  She has a pain in the left lower abdomen.  I do not know if this is diverticulosis or may be a little diverticulitis.  I will go ahead and call in some Flagyl for her.  I do not think this would be a bad idea.  She has had no bleeding.  She is had no obvious change in bowel or bladder habits.  She has had no leg swelling.  There is been no cough.  She is had no congestion.  Her appetite is okay.  She has no nausea or vomiting.  Her last iron studies that we did back in March showed a ferritin of 25 with an iron saturation of 10%.  Overall, I said her performance status is ECOG 1.  Medications:  Allergies as of 11/07/2017      Reactions   Penicillins Hives, Rash, Other (See Comments)   Has patient had a PCN reaction causing immediate rash, facial/tongue/throat swelling, SOB or lightheadedness with hypotension: Yes Has patient had a PCN reaction causing severe rash involving mucus membranes or skin necrosis: yes Has patient had a PCN reaction that required hospitalization: No Has patient had a PCN reaction occurring within the last 10 years: no If all of the above answers are "NO", then may proceed with Cephalosporin use.   Atorvastatin Diarrhea   REACTION: diarrhe   Ezetimibe    REACTION: nausea   Fluvastatin Sodium Other (See Comments)   "Can't remember"   Magnesium Hydroxide  Other (See Comments)   REACTION: triggers HAs   Meloxicam Other (See Comments)   Pt seeing auro's / spots.     Omeprazole Magnesium    auras   Pneumovax [pneumococcal Polysaccharide Vaccine]    Local reaction   Quinapril Hcl Other (See Comments)   03-29-11 "feelings of tiredness"   Simvastatin Diarrhea   Topamax [topiramate] Itching   Valsartan Itching, Other (See Comments)   REACTION: toes itch   Vit D-vit E-safflower Oil Other (See Comments)   Headaches   Carvedilol    "teeth hurt when I took it"      Medication List        Accurate as of 11/07/17  2:31 PM. Always use your most recent med list.          acetaminophen-codeine 300-30 MG tablet Commonly known as:  TYLENOL #3 Take 1 tablet by mouth every 8 (eight) hours as needed. For migraine   aspirin 81 MG tablet Take 81 mg by mouth daily.   co-enzyme Q-10 30 MG capsule Take 30 mg by mouth daily.   Fish Oil 1000 MG Caps Take by mouth.   FOLIC ACID PO Take by mouth.   hydrochlorothiazide 25 MG tablet Commonly known as:  HYDRODIURIL Take 1 tablet (25 mg total) by mouth daily as needed (swelling in the legs ).   hydroxyurea 500 MG  capsule Commonly known as:  HYDREA Take 1 capsule (500 mg total) by mouth daily. May take with food to minimize GI side effects.   hydroxyurea 500 MG capsule Commonly known as:  HYDREA TAKE 1 CAPSULE BY MOUTH EVERY OTHER DAY TAKE WITH FOOD TO MINIMIZE SIDE EFFECT   LORazepam 0.5 MG tablet Commonly known as:  ATIVAN TAKE HALF TABLET EVERY MORNING AND AFTERNOON. TAKE 2 TABLETS AT BEDTIME AS NEEDED ANXIETY   metoprolol tartrate 50 MG tablet Commonly known as:  LOPRESSOR Take 1 tablet (50 mg total) by mouth 2 (two) times daily.   omeprazole 20 MG capsule Commonly known as:  PRILOSEC Take 20 mg by mouth daily.       Allergies:  Allergies  Allergen Reactions  . Penicillins Hives, Rash and Other (See Comments)    Has patient had a PCN reaction causing immediate rash,  facial/tongue/throat swelling, SOB or lightheadedness with hypotension: Yes Has patient had a PCN reaction causing severe rash involving mucus membranes or skin necrosis: yes Has patient had a PCN reaction that required hospitalization: No Has patient had a PCN reaction occurring within the last 10 years: no If all of the above answers are "NO", then may proceed with Cephalosporin use.   . Atorvastatin Diarrhea    REACTION: diarrhe  . Ezetimibe     REACTION: nausea  . Fluvastatin Sodium Other (See Comments)    "Can't remember"  . Magnesium Hydroxide Other (See Comments)    REACTION: triggers HAs  . Meloxicam Other (See Comments)    Pt seeing auro's / spots.    . Omeprazole Magnesium     auras  . Pneumovax [Pneumococcal Polysaccharide Vaccine]     Local reaction  . Quinapril Hcl Other (See Comments)    03-29-11 "feelings of tiredness"  . Simvastatin Diarrhea  . Topamax [Topiramate] Itching  . Valsartan Itching and Other (See Comments)    REACTION: toes itch  . Vit D-Vit E-Safflower Oil Other (See Comments)    Headaches  . Carvedilol     "teeth hurt when I took it"    Past Medical History, Surgical history, Social history, and Family History were reviewed and updated.  Review of Systems: Review of Systems  Constitutional: Negative.   HENT: Negative.   Eyes: Negative.   Respiratory: Negative.   Cardiovascular: Negative.   Gastrointestinal: Negative.   Genitourinary: Negative.   Musculoskeletal: Negative.   Skin: Negative.   Neurological: Negative.   Endo/Heme/Allergies: Negative.   Psychiatric/Behavioral: Negative.      Physical Exam:  weight is 151 lb (68.5 kg). Her oral temperature is 98.5 F (36.9 C). Her blood pressure is 136/68 and her pulse is 66. Her respiration is 16 and oxygen saturation is 98%.   Wt Readings from Last 3 Encounters:  11/07/17 151 lb (68.5 kg)  09/02/17 150 lb 6 oz (68.2 kg)  08/08/17 152 lb 2 oz (69 kg)    Physical Exam    Constitutional: She is oriented to person, place, and time.  HENT:  Head: Normocephalic and atraumatic.  Mouth/Throat: Oropharynx is clear and moist.  Eyes: Pupils are equal, round, and reactive to light. EOM are normal.  Neck: Normal range of motion.  Cardiovascular: Normal rate, regular rhythm and normal heart sounds.  Pulmonary/Chest: Effort normal and breath sounds normal.  Abdominal: Soft. Bowel sounds are normal.  Musculoskeletal: Normal range of motion. She exhibits no edema, tenderness or deformity.  Lymphadenopathy:    She has no cervical adenopathy.  Neurological: She is  alert and oriented to person, place, and time.  Skin: Skin is warm and dry. No rash noted. No erythema.  Psychiatric: She has a normal mood and affect. Her behavior is normal. Judgment and thought content normal.  Vitals reviewed.    Lab Results  Component Value Date   WBC 6.2 11/07/2017   HGB 12.9 11/07/2017   HCT 41.7 11/07/2017   MCV 87.1 11/07/2017   PLT 396 11/07/2017   Lab Results  Component Value Date   FERRITIN 25 08/08/2017   IRON 40 (L) 08/08/2017   TIBC 400 08/08/2017   UIBC 360 08/08/2017   IRONPCTSAT 10 (L) 08/08/2017   Lab Results  Component Value Date   RETICCTPCT 0.8 02/02/2015   RBC 4.79 11/07/2017   RETICCTABS 41.4 02/02/2015   No results found for: KPAFRELGTCHN, LAMBDASER, KAPLAMBRATIO No results found for: IGGSERUM, IGA, IGMSERUM No results found for: Odetta Pink, SPEI   Chemistry      Component Value Date/Time   NA 137 11/07/2017 1319   NA 142 03/25/2017 1457   NA 139 04/10/2016 0855   K 4.0 11/07/2017 1319   K 4.1 03/25/2017 1457   K 4.2 04/10/2016 0855   CL 98 11/07/2017 1319   CL 96 (L) 03/25/2017 1457   CO2 32 11/07/2017 1319   CO2 32 03/25/2017 1457   CO2 29 04/10/2016 0855   BUN 7 11/07/2017 1319   BUN 7 03/25/2017 1457   BUN 10.0 04/10/2016 0855   CREATININE 0.70 11/07/2017 1319   CREATININE 1.0  03/25/2017 1457   CREATININE 0.8 04/10/2016 0855      Component Value Date/Time   CALCIUM 9.1 11/07/2017 1319   CALCIUM 9.8 03/25/2017 1457   CALCIUM 9.5 04/10/2016 0855   ALKPHOS 57 11/07/2017 1319   ALKPHOS 86 (H) 03/25/2017 1457   ALKPHOS 85 04/10/2016 0855   AST 26 11/07/2017 1319   AST 19 04/10/2016 0855   ALT 15 11/07/2017 1319   ALT 15 03/25/2017 1457   ALT 13 04/10/2016 0855   BILITOT 1.6 11/07/2017 1319   BILITOT 1.26 (H) 04/10/2016 0855       Impression and Plan: Ms. Molnar is a very pleasant 84 yo caucasian female with polycythemia and history of stage I infiltrating ductal carcinoma of the left breast with lumpectomy in 2013.   She completed treatment with Femara in April 2017.  Hopefully, the Flagyl will help her out.  If not, I recommended that she see her family doctor.  I do not think we have to do any scans.  I do not think there is any issues with her breast cancer.  She does not need to be phlebotomized for the polycythemia.  We will get her back in about 6 weeks so we can follow-up with her symptoms.  Volanda Napoleon, MD 6/27/20192:31 PM

## 2017-11-07 NOTE — Telephone Encounter (Signed)
Pt is requesting refill on lorazepam.   Last OV: 09/02/2017 Last Fill: 09/11/2017 #90 and 1RF (Sig: 1/2 in AM, 1/2 in afternoon, and 2 at bedtime prn) UDS: 03/18/2017 Low risk  NCCR printed- 07/22/2017- no discrepancies noted  Please adivse.

## 2017-11-08 LAB — IRON AND TIBC
Iron: 31 ug/dL (ref 28–170)
Saturation Ratios: 7 % — ABNORMAL LOW (ref 10.4–31.8)
TIBC: 462 ug/dL — ABNORMAL HIGH (ref 250–450)
UIBC: 431 ug/dL

## 2017-11-08 LAB — FERRITIN: Ferritin: 18 ng/mL (ref 11–307)

## 2017-11-18 ENCOUNTER — Encounter: Payer: Self-pay | Admitting: Internal Medicine

## 2017-11-18 ENCOUNTER — Ambulatory Visit (INDEPENDENT_AMBULATORY_CARE_PROVIDER_SITE_OTHER): Payer: Medicare Other | Admitting: Internal Medicine

## 2017-11-18 VITALS — BP 122/70 | HR 58 | Temp 97.5°F | Resp 16 | Ht 66.0 in | Wt 155.0 lb

## 2017-11-18 DIAGNOSIS — R6889 Other general symptoms and signs: Secondary | ICD-10-CM | POA: Diagnosis not present

## 2017-11-18 DIAGNOSIS — I2510 Atherosclerotic heart disease of native coronary artery without angina pectoris: Secondary | ICD-10-CM

## 2017-11-18 DIAGNOSIS — F341 Dysthymic disorder: Secondary | ICD-10-CM

## 2017-11-18 DIAGNOSIS — I10 Essential (primary) hypertension: Secondary | ICD-10-CM | POA: Diagnosis not present

## 2017-11-18 NOTE — Patient Instructions (Signed)
GO TO THE FRONT DESK Schedule your next appointment for a check up in 5 to 6 months

## 2017-11-18 NOTE — Progress Notes (Signed)
Pre visit review using our clinic review tool, if applicable. No additional management support is needed unless otherwise documented below in the visit note. 

## 2017-11-18 NOTE — Progress Notes (Signed)
Subjective:    Patient ID: [name], female    DOB: 1929-08-26, 84 y.o.   MRN: [id number]  DOS:  11/18/2017 Type of visit - description : rov Interval history: Since the last office visit, she feels well. She reported cold intolerance: sx resolved Saw hematology, note reviewed, she had some lower abdominal pain and a feverish feeling, was prescribed Flagyl.  Feels better.  Review of Systems  Pain well controlled, hardly ever takes Tylenol 3 For the last 4 to 6 weeks has experienced some numbness around the ankles bilaterally, no rash, swelling, no nocturnal symptoms, no feet numbness.  Past Medical History:  Diagnosis Date  . CAD (coronary artery disease)    mild nonobstructive disease on cath in 2003  . Cancer ([place])   . Cardiomyopathy    Probable Takotsubo, severe CP w/ normal cath in 1994. Severe CP in 2003 w/ widespread T wave inversions on ECG. Cath w/ minimal coronary disease but LV-gram showed periapical severe hypokinesis and basilar hyperkinesia (EF 40%). Last echo in 4/09 confirmed full LV functional recovery with EF 60%, no regional wall motion abnormalities, mild to moderate LVH.  Marland Kitchen CVA (cerebral infarction)   . Depression with anxiety 03-29-11   lost husband 3'09  . Diverticulitis   . DVT (deep venous thrombosis) ([place])    after venous ablation, R leg  . E. coli gastroenteritis 03-29-11   8'10  . Fracture 09/10/07   L2, status post vertebroplasty of L2 performed by IR  . GERD (gastroesophageal reflux disease)   . Headache(784.0)    migraines  . Hemorrhoids   . Hiatal hernia 03-29-11   no nerve problems  . Hyperlipidemia   . Hyperplastic colon polyp 06/2001  . Hypertension   . OA (osteoarthritis) of knee 03-29-11   w/ bilateral knee pain-not a problem now  . OSA (obstructive sleep apnea) 03-29-11   no cpap used, not a problem now.  . Osteoporosis   . Otalgia of both ears    Dr. Simeon Craft  . Polycythemia   . Stroke [facility]) 03-29-11   CVA x2 -last  10'12-?TIA(visual problems)  . Varicose vein     Past Surgical History:  Procedure Laterality Date  . APPENDECTOMY    . BACK SURGERY    . BREAST BIOPSY    . BREAST CYST EXCISION    . BREAST LUMPECTOMY Left 2013   left stage I left breast cancer  . CHOLECYSTECTOMY  04/09/2011   Procedure: LAPAROSCOPIC CHOLECYSTECTOMY WITH INTRAOPERATIVE CHOLANGIOGRAM;  Surgeon: Odis Hollingshead, MD;  Location: [facility];  Service: General;  Laterality: N/A;  . Dental Extraction     L maxillary molar  . KYPHOSIS SURGERY  08/2007   cement used  . OVARIAN CYST SURGERY     left  . TONSILLECTOMY    . TOTAL KNEE ARTHROPLASTY  06/2010   left  . TUBAL LIGATION      Social History   Socioeconomic History  . Marital status: Widowed    Spouse name: Not on file  . Number of children: 3  . Years of education: Not on file  . Highest education level: Not on file  Occupational History  . Occupation: n/a  Social Needs  . Financial resource strain: Not on file  . Food insecurity:    Worry: Not on file    Inability: Not on file  . Transportation needs:    Medical: Not on file    Non-medical: Not on file  Tobacco Use  .  Smoking status: Former Smoker    Packs/day: 1.00    Years: 37.00    Pack years: 37.00    Types: Cigarettes    Start date: 06/26/1951    Last attempt to quit: 05/14/1988    Years since quitting: 29.5  . Smokeless tobacco: Never Used  . Tobacco comment: quit 25 years ago  Substance and Sexual Activity  . Alcohol use: Yes    Alcohol/week: 0.6 oz    Types: 1 Cans of beer per week    Comment: occasional/social  . Drug use: No  . Sexual activity: Not Currently  Lifestyle  . Physical activity:    Days per week: Not on file    Minutes per session: Not on file  . Stress: Not on file  Relationships  . Social connections:    Talks on phone: Not on file    Gets together: Not on file    Attends religious service: Not on file    Active member of club or organization: Not on file     Attends meetings of clubs or organizations: Not on file    Relationship status: Not on file  . Intimate partner violence:    Fear of current or ex partner: Not on file    Emotionally abused: Not on file    Physically abused: Not on file    Forced sexual activity: Not on file  Other Topics Concern  . Not on file  Social History Narrative   Lost husband 07/29/07-    Lives in [place] in a town house by herself, has 3 daughters, lost Mickel Baas [phone])   Early Chars lives in [place], Margaretha [place] ( [place])     Still drives                Allergies as of 11/18/2017      Reactions   Penicillins Hives, Rash, Other (See Comments)   Has patient had a PCN reaction causing immediate rash, facial/tongue/throat swelling, SOB or lightheadedness with hypotension: Yes Has patient had a PCN reaction causing severe rash involving mucus membranes or skin necrosis: yes Has patient had a PCN reaction that required hospitalization: No Has patient had a PCN reaction occurring within the last 10 years: no If all of the above answers are "NO", then may proceed with Cephalosporin use.   Atorvastatin Diarrhea   REACTION: diarrhe   Ezetimibe    REACTION: nausea   Fluvastatin Sodium Other (See Comments)   "Can't remember"   Magnesium Hydroxide Other (See Comments)   REACTION: triggers HAs   Meloxicam Other (See Comments)   Pt seeing auro's / spots.     Omeprazole Magnesium    auras   Pneumovax [pneumococcal Polysaccharide Vaccine]    Local reaction   Quinapril Hcl Other (See Comments)   03-29-11 "feelings of tiredness"   Simvastatin Diarrhea   Topamax [topiramate] Itching   Valsartan Itching, Other (See Comments)   REACTION: toes itch   Vit D-vit E-safflower Oil Other (See Comments)   Headaches   Carvedilol    "teeth hurt when I took it"      Medication List        Accurate as of 11/18/17 11:59 PM. Always use your most recent med list.          acetaminophen-codeine 300-30 MG tablet Commonly  known as:  TYLENOL #3 Take 1 tablet by mouth every 8 (eight) hours as needed. For migraine   aspirin 81 MG tablet Take 81 mg by mouth  daily.   co-enzyme Q-10 30 MG capsule Take 30 mg by mouth daily.   FOLIC ACID PO Take by mouth.   hydrochlorothiazide 25 MG tablet Commonly known as:  HYDRODIURIL Take 1 tablet (25 mg total) by mouth daily as needed (swelling in the legs ).   hydroxyurea 500 MG capsule Commonly known as:  HYDREA Take 1 capsule (500 mg total) by mouth daily. May take with food to minimize GI side effects.   LORazepam 0.5 MG tablet Commonly known as:  ATIVAN TAKE HALF TABLET EVERY MORNING AND AFTERNOON. TAKE 2 TABLETS AT BEDTIME AS NEEDED ANXIETY   metoprolol tartrate 50 MG tablet Commonly known as:  LOPRESSOR Take 1 tablet (50 mg total) by mouth 2 (two) times daily.   omeprazole 20 MG capsule Commonly known as:  PRILOSEC Take 20 mg by mouth daily.          Objective:   Physical Exam  Musculoskeletal:       Feet:   BP 122/70 (BP Location: Left Arm, Patient Position: Sitting, Cuff Size: Small)   Pulse (!) 58   Temp (!) 97.5 F (36.4 C) (Oral)   Resp 16   Ht 5\' 6"  (1.676 m)   Wt 155 lb (70.3 kg)   SpO2 98%   BMI 25.02 kg/m  General:   Well developed, NAD, see BMI.  HEENT:  Normocephalic . Face symmetric, atraumatic Lungs:  CTA B Normal respiratory effort, no intercostal retractions, no accessory muscle use. Heart: RRR,  no murmur.  Lower extremities: + Varicose veins without phlebitis, no edema, good pedal pulses Skin: Not pale. Not jaundice Neurologic:  alert & oriented X3.  Speech normal, gait appropriate for age and unassisted Psych--  Cognition and judgment appear intact.  Cooperative with normal attention span and concentration.  Behavior appropriate. No anxious or depressed appearing.      Assessment & Plan:   Assessment  HTN Dyslipidemia: Intolerant to most medicines including Zetia, Pravachol, Niaspan Depression, anxiety  , insomnia - on ativan bid (03-2015 citalopram cause drowsiness, 04-2015 prozac diarrhea) Hem-Onc: POLYCYTEMIA-- sees hem-onc H/o breast ca  MSK: ---DJD :  Tylenol #3, uses rarely ---Osteoporosis:  T score -2.8 (02-2013): declined treatment 06-2017 ---L2 fractures, s/p vertebral plasty 2009 Migraine headaches - Tylenol #3 (rarely)  GI: GERD- intolerant to prevacid, visual disturbances HH, diverticulitis, Colon polyps  CV: ---Cardiomyopathy Probable Takotsubo cardiomyopathy: CP w/ normal cath in 1994.   Severe CP  2003 with widespread T wave inversions on ECG.  Cath with minimal coronary disease but LV-gram showed periapical severe hypokinesis and basilar hyperkinesia (EF 40%) >>> ECHO 4/09 confirmed full LV functional recovery with EF 60%, mild LVH. 2012: Nl EF per echo, (-) Lexiscan ---Stroke / TIA 2012 Sleep apnea 2012, , not on CPAP H/oDVT, right leg after venous ablation    PLAN HTN: Good compliance with HCTZ, metoprolol, last BMP satisfactory Depression, anxiety, insomnia: Not an issue at this point. Abdominal discomfort: Was prescribed Flagyl by hematology, improved. Cold Intolerance, low-grade fever: See last visit.  Resolved Paresthesias: Like a band around the legs, see graphic, vascular exam normal, no phlebitis, no edema.  No rash.  Etiology unclear.  History of normal B12 and corrected low vitamin D.  Recommend observation for now.   Preventive care: To have a mammogram soon. RTC 5 to 6 months.

## 2017-11-19 NOTE — Assessment & Plan Note (Signed)
HTN: Good compliance with HCTZ, metoprolol, last BMP satisfactory Depression, anxiety, insomnia: Not an issue at this point. Abdominal discomfort: Was prescribed Flagyl by hematology, improved. Cold Intolerance, low-grade fever: See last visit.  Resolved Paresthesias: Like a band around the legs, see graphic, vascular exam normal, no phlebitis, no edema.  No rash.  Etiology unclear.  History of normal B12 and corrected low vitamin D.  Recommend observation for now.   Preventive care: To have a mammogram soon. RTC 5 to 6 months.

## 2017-11-22 ENCOUNTER — Ambulatory Visit
Admission: RE | Admit: 2017-11-22 | Discharge: 2017-11-22 | Disposition: A | Discharge status: Home / Self Care | Payer: Medicare Other | Source: Ambulatory Visit | Attending: Hematology & Oncology | Admitting: Hematology & Oncology

## 2017-11-22 ENCOUNTER — Other Ambulatory Visit: Payer: Self-pay | Admitting: Hematology & Oncology

## 2017-11-22 DIAGNOSIS — Z1231 Encounter for screening mammogram for malignant neoplasm of breast: Secondary | ICD-10-CM

## 2017-11-22 DIAGNOSIS — Z9889 Other specified postprocedural states: Secondary | ICD-10-CM

## 2017-11-25 DIAGNOSIS — H52203 Unspecified astigmatism, bilateral: Secondary | ICD-10-CM | POA: Diagnosis not present

## 2017-11-25 DIAGNOSIS — H2513 Age-related nuclear cataract, bilateral: Secondary | ICD-10-CM | POA: Diagnosis not present

## 2017-11-25 DIAGNOSIS — H524 Presbyopia: Secondary | ICD-10-CM | POA: Diagnosis not present

## 2017-11-25 DIAGNOSIS — H353131 Nonexudative age-related macular degeneration, bilateral, early dry stage: Secondary | ICD-10-CM | POA: Diagnosis not present

## 2017-11-25 DIAGNOSIS — H5203 Hypermetropia, bilateral: Secondary | ICD-10-CM | POA: Diagnosis not present

## 2017-11-25 DIAGNOSIS — H25013 Cortical age-related cataract, bilateral: Secondary | ICD-10-CM | POA: Diagnosis not present

## 2017-11-25 DIAGNOSIS — H43813 Vitreous degeneration, bilateral: Secondary | ICD-10-CM | POA: Diagnosis not present

## 2017-12-16 DIAGNOSIS — M79604 Pain in right leg: Secondary | ICD-10-CM | POA: Diagnosis not present

## 2017-12-30 ENCOUNTER — Other Ambulatory Visit: Payer: Self-pay

## 2017-12-30 ENCOUNTER — Encounter: Payer: Self-pay | Admitting: Hematology & Oncology

## 2017-12-30 ENCOUNTER — Inpatient Hospital Stay: Payer: Medicare Other

## 2017-12-30 ENCOUNTER — Inpatient Hospital Stay: Payer: Medicare Other | Attending: Hematology & Oncology | Admitting: Hematology & Oncology

## 2017-12-30 DIAGNOSIS — D751 Secondary polycythemia: Secondary | ICD-10-CM

## 2017-12-30 DIAGNOSIS — D500 Iron deficiency anemia secondary to blood loss (chronic): Secondary | ICD-10-CM

## 2017-12-30 DIAGNOSIS — D45 Polycythemia vera: Secondary | ICD-10-CM | POA: Insufficient documentation

## 2017-12-30 DIAGNOSIS — C50011 Malignant neoplasm of nipple and areola, right female breast: Secondary | ICD-10-CM

## 2017-12-30 DIAGNOSIS — E611 Iron deficiency: Secondary | ICD-10-CM

## 2017-12-30 DIAGNOSIS — R5383 Other fatigue: Secondary | ICD-10-CM | POA: Diagnosis not present

## 2017-12-30 DIAGNOSIS — Z853 Personal history of malignant neoplasm of breast: Secondary | ICD-10-CM | POA: Diagnosis not present

## 2017-12-30 HISTORY — DX: Iron deficiency anemia secondary to blood loss (chronic): D50.0

## 2017-12-30 LAB — CBC WITH DIFFERENTIAL (CANCER CENTER ONLY)
Basophils Absolute: 0 10*3/uL (ref 0.0–0.1)
Basophils Relative: 1 %
Eosinophils Absolute: 0.2 10*3/uL (ref 0.0–0.5)
Eosinophils Relative: 3 %
HCT: 42.1 % (ref 34.8–46.6)
Hemoglobin: 12.7 g/dL (ref 11.6–15.9)
Lymphocytes Relative: 20 %
Lymphs Abs: 1.2 10*3/uL (ref 0.9–3.3)
MCH: 26.2 pg (ref 26.0–34.0)
MCHC: 30.2 g/dL — ABNORMAL LOW (ref 32.0–36.0)
MCV: 86.8 fL (ref 81.0–101.0)
Monocytes Absolute: 0.6 10*3/uL (ref 0.1–0.9)
Monocytes Relative: 9 %
Neutro Abs: 4 10*3/uL (ref 1.5–6.5)
Neutrophils Relative %: 67 %
Platelet Count: 423 10*3/uL — ABNORMAL HIGH (ref 145–400)
RBC: 4.85 MIL/uL (ref 3.70–5.32)
RDW: 15.1 % (ref 11.1–15.7)
WBC Count: 6 10*3/uL (ref 3.9–10.0)

## 2017-12-30 LAB — CMP (CANCER CENTER ONLY)
ALT: 20 U/L (ref 10–47)
AST: 23 U/L (ref 11–38)
Albumin: 3.8 g/dL (ref 3.5–5.0)
Alkaline Phosphatase: 59 U/L (ref 26–84)
Anion gap: 6 (ref 5–15)
BUN: 6 mg/dL — ABNORMAL LOW (ref 7–22)
CO2: 32 mmol/L (ref 18–33)
Calcium: 9.8 mg/dL (ref 8.0–10.3)
Chloride: 101 mmol/L (ref 98–108)
Creatinine: 0.7 mg/dL (ref 0.60–1.20)
Glucose, Bld: 103 mg/dL (ref 73–118)
Potassium: 4.2 mmol/L (ref 3.3–4.7)
Sodium: 139 mmol/L (ref 128–145)
Total Bilirubin: 1.5 mg/dL (ref 0.2–1.6)
Total Protein: 7 g/dL (ref 6.4–8.1)

## 2017-12-30 LAB — LACTATE DEHYDROGENASE: LDH: 187 U/L (ref 98–192)

## 2017-12-30 MED ORDER — SODIUM CHLORIDE 0.9 % IV SOLN
INTRAVENOUS | Status: DC
  Administered 2017-12-30: 15:00:00 via INTRAVENOUS
  Filled 2017-12-30: qty 250

## 2017-12-30 MED ORDER — SODIUM CHLORIDE 0.9 % IV SOLN
510.0000 mg | Freq: Once | INTRAVENOUS | Status: AC
  Administered 2017-12-30: 510 mg via INTRAVENOUS
  Filled 2017-12-30: qty 17

## 2017-12-30 NOTE — Patient Instructions (Signed)

## 2017-12-30 NOTE — Progress Notes (Signed)
Hematology and Oncology Follow Up Visit  [name] [id number] [birth year] 84 y.o. 12/30/2017   Principle Diagnosis:  Polycythemia vera - JAK2 positive Stage I (T1bN0M0 ) infiltrating duct carcinoma the left breast   Past Therapy: Femara 2.5 mg by mouth daily - stopped 08/2015  Current Therapy:   Phlebotomy to maintain hematocrit below 45% Hydrea 500 mg by mouth every other day    Interim History:  [name] is here today for follow-up.  She is not feeling all that well.  She is feeling quite tired.  I suspect that she probably is iron deficient.  She is quite iron deficient when we last saw her.  I am going to go ahead and give her some IV iron.  I think this would help her feel a little bit better.  Her blood is low enough that we can safely give her iron.  She is had no bleeding.  She has had no nausea or vomiting.  She has had no rashes.  She is quite sad because her dog of 17 years passed on around 11/21/2022.  This is really weighed on her.  This really has been a traumatic event for her.  I really feel bad that she had to indoor the sadness.    Overall, I said her performance status is ECOG 1.  Medications:  Allergies as of 12/30/2017      Reactions   Penicillins Hives, Rash, Other (See Comments)   Has patient had a PCN reaction causing immediate rash, facial/tongue/throat swelling, SOB or lightheadedness with hypotension: Yes Has patient had a PCN reaction causing severe rash involving mucus membranes or skin necrosis: yes Has patient had a PCN reaction that required hospitalization: No Has patient had a PCN reaction occurring within the last 10 years: no If all of the above answers are "NO", then may proceed with Cephalosporin use. Other Reaction: OTHER REACTION   Atorvastatin Diarrhea   REACTION: diarrhe   Ezetimibe    REACTION: nausea   Fluvastatin Sodium Other (See Comments)   "Can't remember"   Magnesium Hydroxide Other (See Comments)   REACTION: triggers  HAs   Meloxicam Other (See Comments)   Pt seeing auro's / spots.     Omeprazole Magnesium    auras   Pneumovax [pneumococcal Polysaccharide Vaccine]    Local reaction   Quinapril Hcl Other (See Comments)   03-29-11 "feelings of tiredness"   Simvastatin Diarrhea   Topamax [topiramate] Itching   Valsartan Itching, Other (See Comments)   REACTION: toes itch   Vit D-vit E-safflower Oil Other (See Comments)   Headaches   Carvedilol    "teeth hurt when I took it" "teeth hurt when I took it"      Medication List        Accurate as of 12/30/17  4:07 PM. Always use your most recent med list.          acetaminophen-codeine 300-30 MG tablet Commonly known as:  TYLENOL #3 Take 1 tablet by mouth every 8 (eight) hours as needed. For migraine   aspirin 81 MG tablet Take 81 mg by mouth daily.   co-enzyme Q-10 30 MG capsule Take 30 mg by mouth daily.   FOLIC ACID PO Take by mouth.   hydrochlorothiazide 25 MG tablet Commonly known as:  HYDRODIURIL Take 1 tablet (25 mg total) by mouth daily as needed (swelling in the legs ).   hydroxyurea 500 MG capsule Commonly known as:  HYDREA Take 1 capsule (500 mg  total) by mouth daily. May take with food to minimize GI side effects.   LORazepam 0.5 MG tablet Commonly known as:  ATIVAN TAKE HALF TABLET EVERY MORNING AND AFTERNOON. TAKE 2 TABLETS AT BEDTIME AS NEEDED ANXIETY   metoprolol tartrate 50 MG tablet Commonly known as:  LOPRESSOR Take 1 tablet (50 mg total) by mouth 2 (two) times daily.   omeprazole 20 MG capsule Commonly known as:  PRILOSEC Take 20 mg by mouth daily.       Allergies:  Allergies  Allergen Reactions  . Penicillins Hives, Rash and Other (See Comments)    Has patient had a PCN reaction causing immediate rash, facial/tongue/throat swelling, SOB or lightheadedness with hypotension: Yes Has patient had a PCN reaction causing severe rash involving mucus membranes or skin necrosis: yes Has patient had a PCN  reaction that required hospitalization: No Has patient had a PCN reaction occurring within the last 10 years: no If all of the above answers are "NO", then may proceed with Cephalosporin use.  Other Reaction: OTHER REACTION  . Atorvastatin Diarrhea    REACTION: diarrhe  . Ezetimibe     REACTION: nausea  . Fluvastatin Sodium Other (See Comments)    "Can't remember"  . Magnesium Hydroxide Other (See Comments)    REACTION: triggers HAs  . Meloxicam Other (See Comments)    Pt seeing auro's / spots.    . Omeprazole Magnesium     auras  . Pneumovax [Pneumococcal Polysaccharide Vaccine]     Local reaction  . Quinapril Hcl Other (See Comments)    03-29-11 "feelings of tiredness"  . Simvastatin Diarrhea  . Topamax [Topiramate] Itching  . Valsartan Itching and Other (See Comments)    REACTION: toes itch  . Vit D-Vit E-Safflower Oil Other (See Comments)    Headaches  . Carvedilol     "teeth hurt when I took it" "teeth hurt when I took it"    Past Medical History, Surgical history, Social history, and Family History were reviewed and updated.  Review of Systems: Review of Systems  Constitutional: Negative.   HENT: Negative.   Eyes: Negative.   Respiratory: Negative.   Cardiovascular: Negative.   Gastrointestinal: Negative.   Genitourinary: Negative.   Musculoskeletal: Negative.   Skin: Negative.   Neurological: Negative.   Endo/Heme/Allergies: Negative.   Psychiatric/Behavioral: Negative.      Physical Exam:  weight is 151 lb 4 oz (68.6 kg). Her oral temperature is 98.2 F (36.8 C). Her blood pressure is 161/61 (abnormal) and her pulse is 63. Her respiration is 18 and oxygen saturation is 95%.   Wt Readings from Last 3 Encounters:  12/30/17 151 lb 4 oz (68.6 kg)  11/18/17 155 lb (70.3 kg)  11/07/17 151 lb (68.5 kg)    Physical Exam  Constitutional: She is oriented to person, place, and time.  HENT:  Head: Normocephalic and atraumatic.  Mouth/Throat: Oropharynx is  clear and moist.  Eyes: Pupils are equal, round, and reactive to light. EOM are normal.  Neck: Normal range of motion.  Cardiovascular: Normal rate, regular rhythm and normal heart sounds.  Pulmonary/Chest: Effort normal and breath sounds normal.  Abdominal: Soft. Bowel sounds are normal.  Musculoskeletal: Normal range of motion. She exhibits no edema, tenderness or deformity.  Lymphadenopathy:    She has no cervical adenopathy.  Neurological: She is alert and oriented to person, place, and time.  Skin: Skin is warm and dry. No rash noted. No erythema.  Psychiatric: She has a normal mood  and affect. Her behavior is normal. Judgment and thought content normal.  Vitals reviewed.    Lab Results  Component Value Date   WBC 6.0 12/30/2017   HGB 12.7 12/30/2017   HCT 42.1 12/30/2017   MCV 86.8 12/30/2017   PLT 423 (H) 12/30/2017   Lab Results  Component Value Date   FERRITIN 18 11/07/2017   IRON 31 11/07/2017   TIBC 462 (H) 11/07/2017   UIBC 431 11/07/2017   IRONPCTSAT 7 (L) 11/07/2017   Lab Results  Component Value Date   RETICCTPCT 0.8 02/02/2015   RBC 4.85 12/30/2017   RETICCTABS 41.4 02/02/2015   No results found for: KPAFRELGTCHN, LAMBDASER, KAPLAMBRATIO No results found for: IGGSERUM, IGA, IGMSERUM No results found for: Odetta Pink, SPEI   Chemistry      Component Value Date/Time   NA 139 12/30/2017 1311   NA 142 03/25/2017 1457   NA 139 04/10/2016 0855   K 4.2 12/30/2017 1311   K 4.1 03/25/2017 1457   K 4.2 04/10/2016 0855   CL 101 12/30/2017 1311   CL 96 (L) 03/25/2017 1457   CO2 32 12/30/2017 1311   CO2 32 03/25/2017 1457   CO2 29 04/10/2016 0855   BUN 6 (L) 12/30/2017 1311   BUN 7 03/25/2017 1457   BUN 10.0 04/10/2016 0855   CREATININE 0.70 12/30/2017 1311   CREATININE 1.0 03/25/2017 1457   CREATININE 0.8 04/10/2016 0855      Component Value Date/Time   CALCIUM 9.8 12/30/2017 1311   CALCIUM 9.8  03/25/2017 1457   CALCIUM 9.5 04/10/2016 0855   ALKPHOS 59 12/30/2017 1311   ALKPHOS 86 (H) 03/25/2017 1457   ALKPHOS 85 04/10/2016 0855   AST 23 12/30/2017 1311   AST 19 04/10/2016 0855   ALT 20 12/30/2017 1311   ALT 15 03/25/2017 1457   ALT 13 04/10/2016 0855   BILITOT 1.5 12/30/2017 1311   BILITOT 1.26 (H) 04/10/2016 0855       Impression and Plan: Ms. Brazill is a very pleasant 84 yo caucasian female with polycythemia and history of stage I infiltrating ductal carcinoma of the left breast with lumpectomy in 2013.   She completed treatment with Femara in April 2017.   Again, we will see how she does with the iron.  I think that this will work for her.  I want to see her back in a month to see how she is feeling.  We can certainly be liberal with taking blood out of her.  Volanda Napoleon, MD 8/19/20194:07 PM

## 2018-01-04 ENCOUNTER — Other Ambulatory Visit: Payer: Self-pay | Admitting: Internal Medicine

## 2018-01-04 DIAGNOSIS — F341 Dysthymic disorder: Secondary | ICD-10-CM

## 2018-01-06 NOTE — Telephone Encounter (Signed)
Requesting: lorazepam 0.5mg . (1/2 tab AM and PM, 2 tabs HS prn Contract: 2018 UDS: 03/2017 Last OV: 11/18/17 Next Ov: 05/19/2018 Last refill: 11/07/17, #90, 1refill Database: none found  Please advise.

## 2018-01-06 NOTE — Telephone Encounter (Signed)
rx sent

## 2018-01-21 ENCOUNTER — Ambulatory Visit (INDEPENDENT_AMBULATORY_CARE_PROVIDER_SITE_OTHER): Payer: Medicare Other | Admitting: Internal Medicine

## 2018-01-21 ENCOUNTER — Encounter: Payer: Self-pay | Admitting: Internal Medicine

## 2018-01-21 VITALS — BP 126/78 | HR 74 | Temp 98.1°F | Resp 16 | Ht 66.0 in | Wt 148.2 lb

## 2018-01-21 DIAGNOSIS — I2510 Atherosclerotic heart disease of native coronary artery without angina pectoris: Secondary | ICD-10-CM

## 2018-01-21 DIAGNOSIS — R1032 Left lower quadrant pain: Secondary | ICD-10-CM | POA: Diagnosis not present

## 2018-01-21 LAB — POC URINALSYSI DIPSTICK (AUTOMATED)
Bilirubin, UA: NEGATIVE
Blood, UA: NEGATIVE
Glucose, UA: NEGATIVE
Ketones, UA: NEGATIVE
Leukocytes, UA: NEGATIVE
Nitrite, UA: NEGATIVE
Protein, UA: POSITIVE — AB
Spec Grav, UA: >=1.03 — AB (ref 1.010–1.025)
Urobilinogen, UA: NEGATIVE E.U./dL — AB
pH, UA: 6 (ref 5.0–8.0)

## 2018-01-21 MED ORDER — DICYCLOMINE HCL 10 MG PO CAPS: 10.0000 mg | ORAL_CAPSULE | Freq: Three times a day (TID) | ORAL | 0 refills | Status: DC

## 2018-01-21 NOTE — Patient Instructions (Addendum)
GO TO THE LAB : Get the blood work     GO TO THE FRONT DESK Schedule your next appointment for a checkup in 10 days if you are not gradually improving  ER if: Fever chills Severe pain Blood in the stools You are feeling worse.   Try a medication called Bentyl , four  times a day.  Watch for drowsiness.

## 2018-01-21 NOTE — Progress Notes (Signed)
Pre visit review using our clinic review tool, if applicable. No additional management support is needed unless otherwise documented below in the visit note. 

## 2018-01-21 NOTE — Progress Notes (Signed)
Subjective:    Patient ID: [name], female    DOB: [birth year], 84 y.o.   MRN: [id number]  DOS:  01/21/2018 Type of visit - description : acute Interval history: Sx started a week ago: Left lower quadrant abdominal pain on and off, pain range from mild to moderate. No radiation Had nausea one time only. Symptoms not worse after eating or having a bowel movement. For some reason she decided to use the Preparation H suppository and pain got better although denies any rectal pain or itching. Patient reports that the symptoms are similar to previous episodes of LLQ pain.  Review of Systems  No fever chills No vomiting, no blood in the stools. Typically has 1 BM daily stools are slightly loose, for the last week, she is having 3 BMs daily stools are much more loose. No dysuria, no gross hematuria. Denies any rectal pain or itching. Past Medical History:  Diagnosis Date  . CAD (coronary artery disease)    mild nonobstructive disease on cath in 2003  . Cancer ([place])   . Cardiomyopathy    Probable Takotsubo, severe CP w/ normal cath in 1994. Severe CP in 2003 w/ widespread T wave inversions on ECG. Cath w/ minimal coronary disease but LV-gram showed periapical severe hypokinesis and basilar hyperkinesia (EF 40%). Last echo in 4/09 confirmed full LV functional recovery with EF 60%, no regional wall motion abnormalities, mild to moderate LVH.  Marland Kitchen CVA (cerebral infarction)   . Depression with anxiety 03-29-11   lost husband 3'09  . Diverticulitis   . DVT (deep venous thrombosis) ([place])    after venous ablation, R leg  . E. coli gastroenteritis 03-29-11   8'10  . Fracture 09/10/07   L2, status post vertebroplasty of L2 performed by IR  . GERD (gastroesophageal reflux disease)   . Headache(784.0)    migraines  . Hemorrhoids   . Hiatal hernia 03-29-11   no nerve problems  . Hyperlipidemia   . Hyperplastic colon polyp 06/2001  . Hypertension   . Iron deficiency anemia due to  chronic blood loss 12/30/2017  . OA (osteoarthritis) of knee 03-29-11   w/ bilateral knee pain-not a problem now  . OSA (obstructive sleep apnea) 03-29-11   no cpap used, not a problem now.  . Osteoporosis   . Otalgia of both ears    Dr. Simeon Craft  . Polycythemia   . Stroke [facility]) 03-29-11   CVA x2 -last 10'12-?TIA(visual problems)  . Varicose vein     Past Surgical History:  Procedure Laterality Date  . APPENDECTOMY    . BACK SURGERY    . BREAST BIOPSY    . BREAST CYST EXCISION    . BREAST LUMPECTOMY Left 2013   left stage I left breast cancer  . CHOLECYSTECTOMY  04/09/2011   Procedure: LAPAROSCOPIC CHOLECYSTECTOMY WITH INTRAOPERATIVE CHOLANGIOGRAM;  Surgeon: Odis Hollingshead, MD;  Location: [facility];  Service: General;  Laterality: N/A;  . Dental Extraction     L maxillary molar  . KYPHOSIS SURGERY  08/2007   cement used  . OVARIAN CYST SURGERY     left  . TONSILLECTOMY    . TOTAL KNEE ARTHROPLASTY  06/2010   left  . TUBAL LIGATION      Social History   Socioeconomic History  . Marital status: Widowed    Spouse name: Not on file  . Number of children: 3  . Years of education: Not on file  . Highest education level: Not on  file  Occupational History  . Occupation: n/a  Social Needs  . Financial resource strain: Not on file  . Food insecurity:    Worry: Not on file    Inability: Not on file  . Transportation needs:    Medical: Not on file    Non-medical: Not on file  Tobacco Use  . Smoking status: Former Smoker    Packs/day: 1.00    Years: 37.00    Pack years: 37.00    Types: Cigarettes    Start date: 06/26/1951    Last attempt to quit: 05/14/1988    Years since quitting: 29.7  . Smokeless tobacco: Never Used  . Tobacco comment: quit 25 years ago  Substance and Sexual Activity  . Alcohol use: Yes    Alcohol/week: 1.0 standard drinks    Types: 1 Cans of beer per week    Comment: occasional/social  . Drug use: No  . Sexual activity: Not Currently  Lifestyle    . Physical activity:    Days per week: Not on file    Minutes per session: Not on file  . Stress: Not on file  Relationships  . Social connections:    Talks on phone: Not on file    Gets together: Not on file    Attends religious service: Not on file    Active member of club or organization: Not on file    Attends meetings of clubs or organizations: Not on file    Relationship status: Not on file  . Intimate partner violence:    Fear of current or ex partner: Not on file    Emotionally abused: Not on file    Physically abused: Not on file    Forced sexual activity: Not on file  Other Topics Concern  . Not on file  Social History Narrative   Lost husband 07/29/07-    Lives in [place] in a town house by herself, has 3 daughters, lost Mickel Baas [phone])   Early Chars lives in [place], [place] ( [place])     Still drives                Allergies as of 01/21/2018      Reactions   Penicillins Hives, Rash, Other (See Comments)   Has patient had a PCN reaction causing immediate rash, facial/tongue/throat swelling, SOB or lightheadedness with hypotension: Yes Has patient had a PCN reaction causing severe rash involving mucus membranes or skin necrosis: yes Has patient had a PCN reaction that required hospitalization: No Has patient had a PCN reaction occurring within the last 10 years: no If all of the above answers are "NO", then may proceed with Cephalosporin use. Other Reaction: OTHER REACTION   Atorvastatin Diarrhea   REACTION: diarrhe   Ezetimibe    REACTION: nausea   Fluvastatin Sodium Other (See Comments)   "Can't remember"   Magnesium Hydroxide Other (See Comments)   REACTION: triggers HAs   Meloxicam Other (See Comments)   Pt seeing auro's / spots.     Omeprazole Magnesium    auras   Pneumovax [pneumococcal Polysaccharide Vaccine]    Local reaction   Quinapril Hcl Other (See Comments)   03-29-11 "feelings of tiredness"   Simvastatin Diarrhea   Topamax [topiramate]  Itching   Valsartan Itching, Other (See Comments)   REACTION: toes itch   Vit D-vit E-safflower Oil Other (See Comments)   Headaches   Carvedilol    "teeth hurt when I took it" "teeth hurt when I  took it"      Medication List        Accurate as of 01/21/18  4:03 PM. Always use your most recent med list.          acetaminophen-codeine 300-30 MG tablet Commonly known as:  TYLENOL #3 Take 1 tablet by mouth every 8 (eight) hours as needed. For migraine   aspirin 81 MG tablet Take 81 mg by mouth daily.   co-enzyme Q-10 30 MG capsule Take 30 mg by mouth daily.   FOLIC ACID PO Take by mouth.   hydrochlorothiazide 25 MG tablet Commonly known as:  HYDRODIURIL Take 1 tablet (25 mg total) by mouth daily as needed (swelling in the legs ).   hydroxyurea 500 MG capsule Commonly known as:  HYDREA Take 1 capsule (500 mg total) by mouth daily. May take with food to minimize GI side effects.   LORazepam 0.5 MG tablet Commonly known as:  ATIVAN TAKE HALF A TABLET BY MOUTH EVERY MORNING AND EVERY AFTERNOON. TAKE 2 TABLETS AT EVERY NIGHT AT BEDTIME AS NEEDED FOR ANXIETY   metoprolol tartrate 50 MG tablet Commonly known as:  LOPRESSOR Take 1 tablet (50 mg total) by mouth 2 (two) times daily.   omeprazole 20 MG capsule Commonly known as:  PRILOSEC Take 20 mg by mouth daily.          Objective:   Physical Exam BP 126/78 (BP Location: Left Arm, Patient Position: Sitting, Cuff Size: Small)   Pulse 74   Temp 98.1 F (36.7 C) (Oral)   Resp 16   Ht 5\' 6"  (1.676 m)   Wt 148 lb 4 oz (67.2 kg)   SpO2 98%   BMI 23.93 kg/m  General:   Well developed, NAD, see BMI.  HEENT:  Normocephalic . Face symmetric, atraumatic Lungs:  CTA B Normal respiratory effort, no intercostal retractions, no accessory muscle use. Heart: RRR,  no murmur.  no pretibial edema bilaterally  Abdomen:  Not distended, soft, minimal tenderness - if any-  to deep palpation at the left lower quadrant but no  mass or rebound. Skin: Not pale. Not jaundice Neurologic:  alert & oriented X3.  Speech normal, gait appropriate for age and unassisted Psych--  Cognition and judgment appear intact.  Cooperative with normal attention span and concentration.  Behavior appropriate. No anxious or depressed appearing.     Assessment & Plan:   Assessment  HTN Dyslipidemia: Intolerant to most medicines including Zetia, Pravachol, Niaspan Depression, anxiety , insomnia - on ativan bid (03-2015 citalopram cause drowsiness, 04-2015 prozac diarrhea) Hem-Onc: POLYCYTEMIA-- sees hem-onc H/o breast ca  MSK: ---DJD :  Tylenol #3, uses rarely ---Osteoporosis:  T score -2.8 (02-2013): declined treatment 06-2017 ---L2 fractures, s/p vertebral plasty 2009 Migraine headaches - Tylenol #3 (rarely)  GI: GERD- intolerant to prevacid, visual disturbances HH, diverticulitis, Colon polyps  --h/o "functional " LLQ abd pain CV: ---Cardiomyopathy Probable Takotsubo cardiomyopathy: CP w/ normal cath in 1994.   Severe CP  2003 with widespread T wave inversions on ECG.  Cath with minimal coronary disease but LV-gram showed periapical severe hypokinesis and basilar hyperkinesia (EF 40%) >>> ECHO 4/09 confirmed full LV functional recovery with EF 60%, mild LVH. 2012: Nl EF per echo, (-) Lexiscan ---Stroke / TIA 2012 Sleep apnea 2012, , not on CPAP H/oDVT, right leg after venous ablation   PLAN LLQ abdominal pain: As described above, symptoms going on for 1 week. On chart review she has a history of "functional" LLQ abdominal pain  since at least 2012.  Also, had similar presentation 07/01/2017, went to the ER, CT abdomen negative for acute diverticulitis. Exam is essentially benign. Udip benign Plan: BMP, CBC, UA, urine culture (if can give a sample big enough).  Call if not gradually better.  Trial with bentyl, watch drowsiness. See AVS

## 2018-01-22 LAB — URINE CULTURE
MICRO NUMBER:: 91081671
Result:: NO GROWTH
SPECIMEN QUALITY:: ADEQUATE

## 2018-01-22 LAB — BASIC METABOLIC PANEL
BUN: 9 mg/dL (ref 6–23)
CO2: 31 mEq/L (ref 19–32)
Calcium: 10.1 mg/dL (ref 8.4–10.5)
Chloride: 100 mEq/L (ref 96–112)
Creatinine, Ser: 0.81 mg/dL (ref 0.40–1.20)
GFR: 71.18 mL/min (ref >60.00)
Glucose, Bld: 99 mg/dL (ref 70–99)
Potassium: 4.7 mEq/L (ref 3.5–5.1)
Sodium: 138 mEq/L (ref 135–145)

## 2018-01-22 LAB — CBC
HCT: 46.1 % — ABNORMAL HIGH (ref 36.0–46.0)
Hemoglobin: 14.7 g/dL (ref 12.0–15.0)
MCHC: 32 g/dL (ref 30.0–36.0)
MCV: 86.9 fl (ref 78.0–100.0)
Platelets: 519 10*3/uL — ABNORMAL HIGH (ref 150.0–400.0)
RBC: 5.31 Mil/uL — ABNORMAL HIGH (ref 3.87–5.11)
RDW: 19.8 % — ABNORMAL HIGH (ref 11.5–15.5)
WBC: 6.7 10*3/uL (ref 4.0–10.5)

## 2018-01-22 NOTE — Assessment & Plan Note (Signed)
LLQ abdominal pain: As described above, symptoms going on for 1 week. On chart review she has a history of "functional" LLQ abdominal pain since at least 2012.  Also, had similar presentation 07/01/2017, went to the ER, CT abdomen negative for acute diverticulitis. Exam is essentially benign. Udip benign Plan: BMP, CBC, UA, urine culture (if can give a sample big enough).  Call if not gradually better.  Trial with bentyl, watch drowsiness. See AVS

## 2018-02-06 ENCOUNTER — Other Ambulatory Visit: Payer: Self-pay | Admitting: Hematology & Oncology

## 2018-02-06 ENCOUNTER — Telehealth: Payer: Self-pay | Admitting: Internal Medicine

## 2018-02-06 DIAGNOSIS — F341 Dysthymic disorder: Secondary | ICD-10-CM

## 2018-02-06 NOTE — Telephone Encounter (Signed)
sent 

## 2018-02-06 NOTE — Telephone Encounter (Signed)
Pt is requesting refill on lorazepam.   Last OV: 01/21/2018 Last Fill: 01/06/2018 #90 and 0RF UDS: 03/18/2017 Low risk  NCCR printed- no discrepancies noted- sent for scanning

## 2018-02-07 IMAGING — DX DG CHEST 2V
2 series · 2 of 2 positions shown · non-contrast
Comparison: Chest x-rays and CTA chest 01/20/2015 and earlier.

CLINICAL DATA: 85-year-old with 3 week history of cough, fatigue
and unexplained 5 pounds weight loss. Current history of
hypertension.

EXAM:
CHEST  2 VIEW

[chest pa]
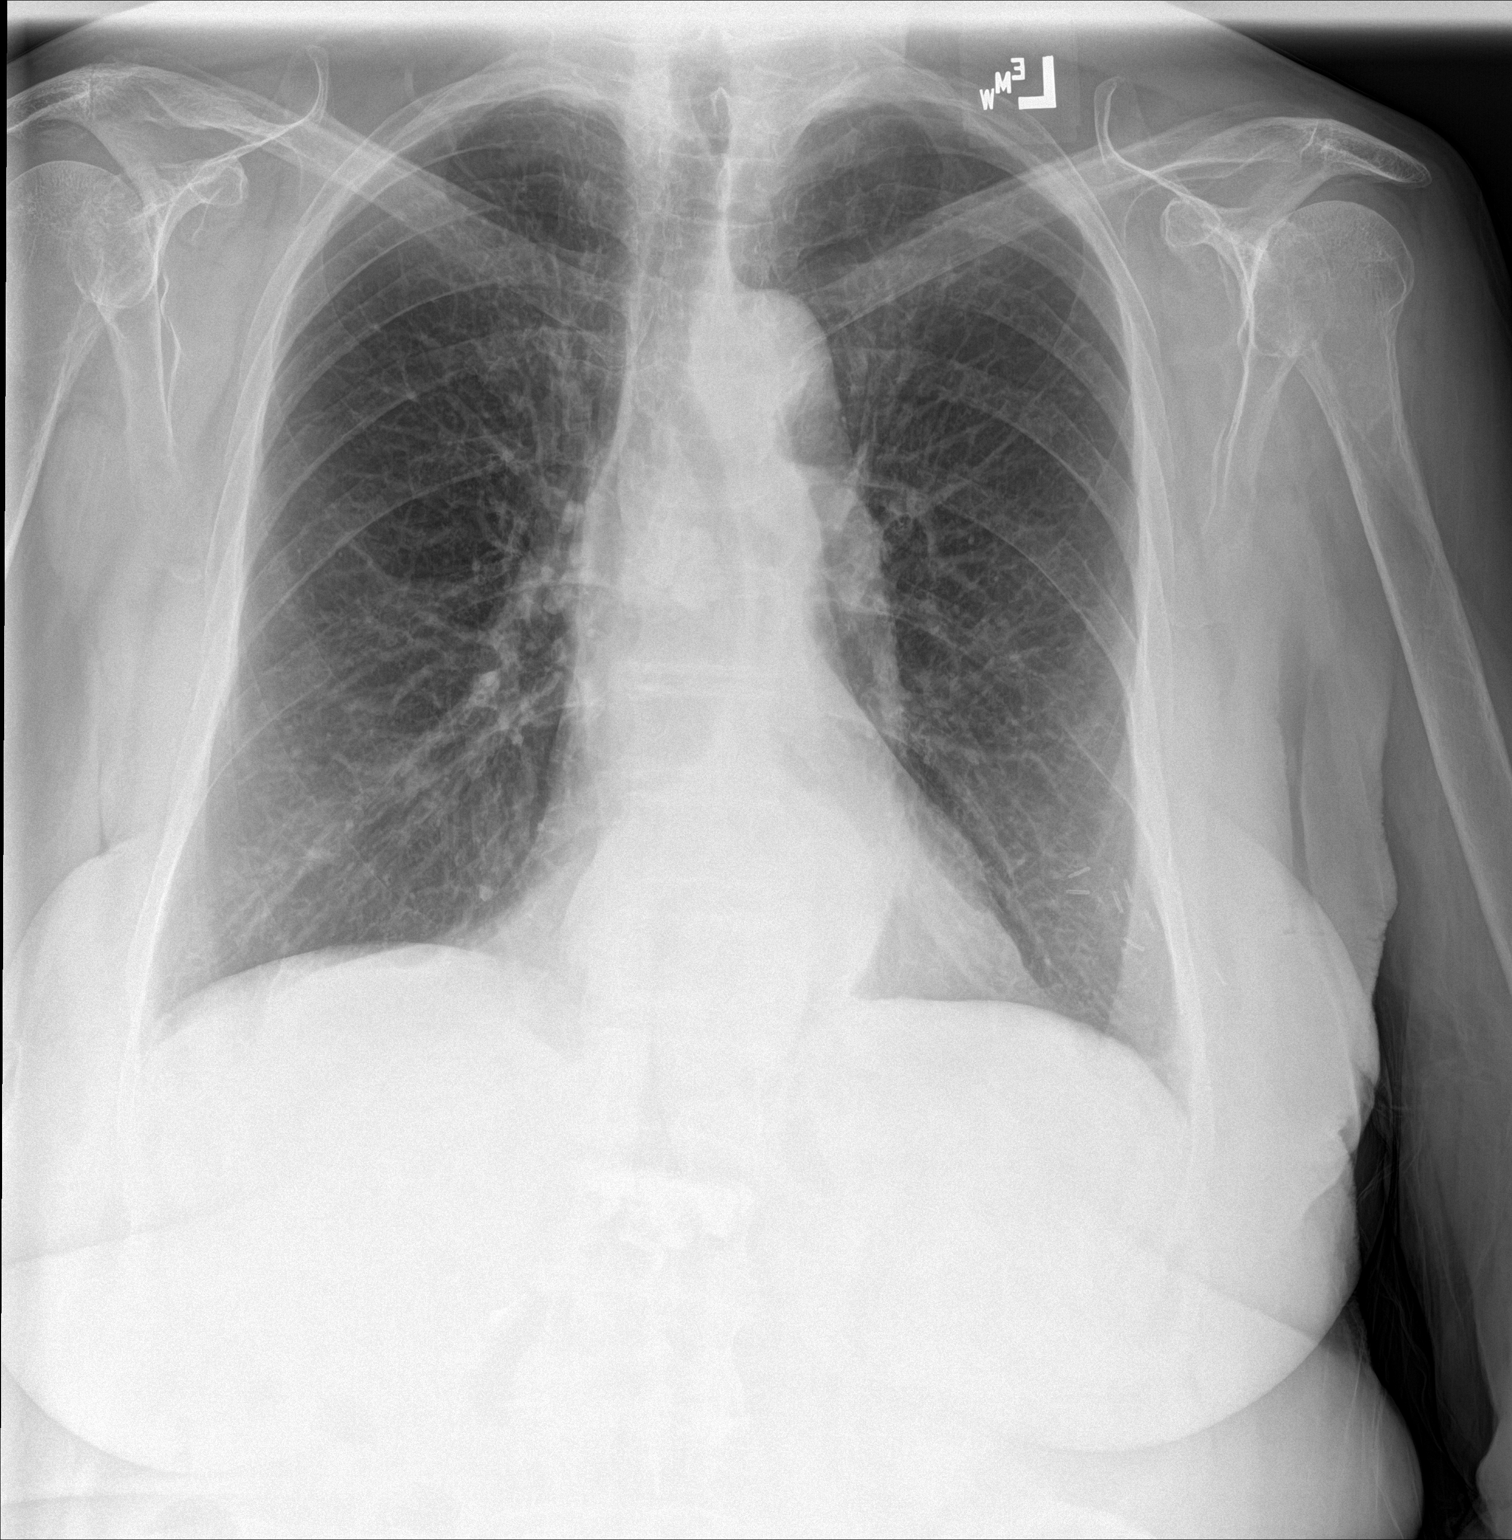

[chest lat]
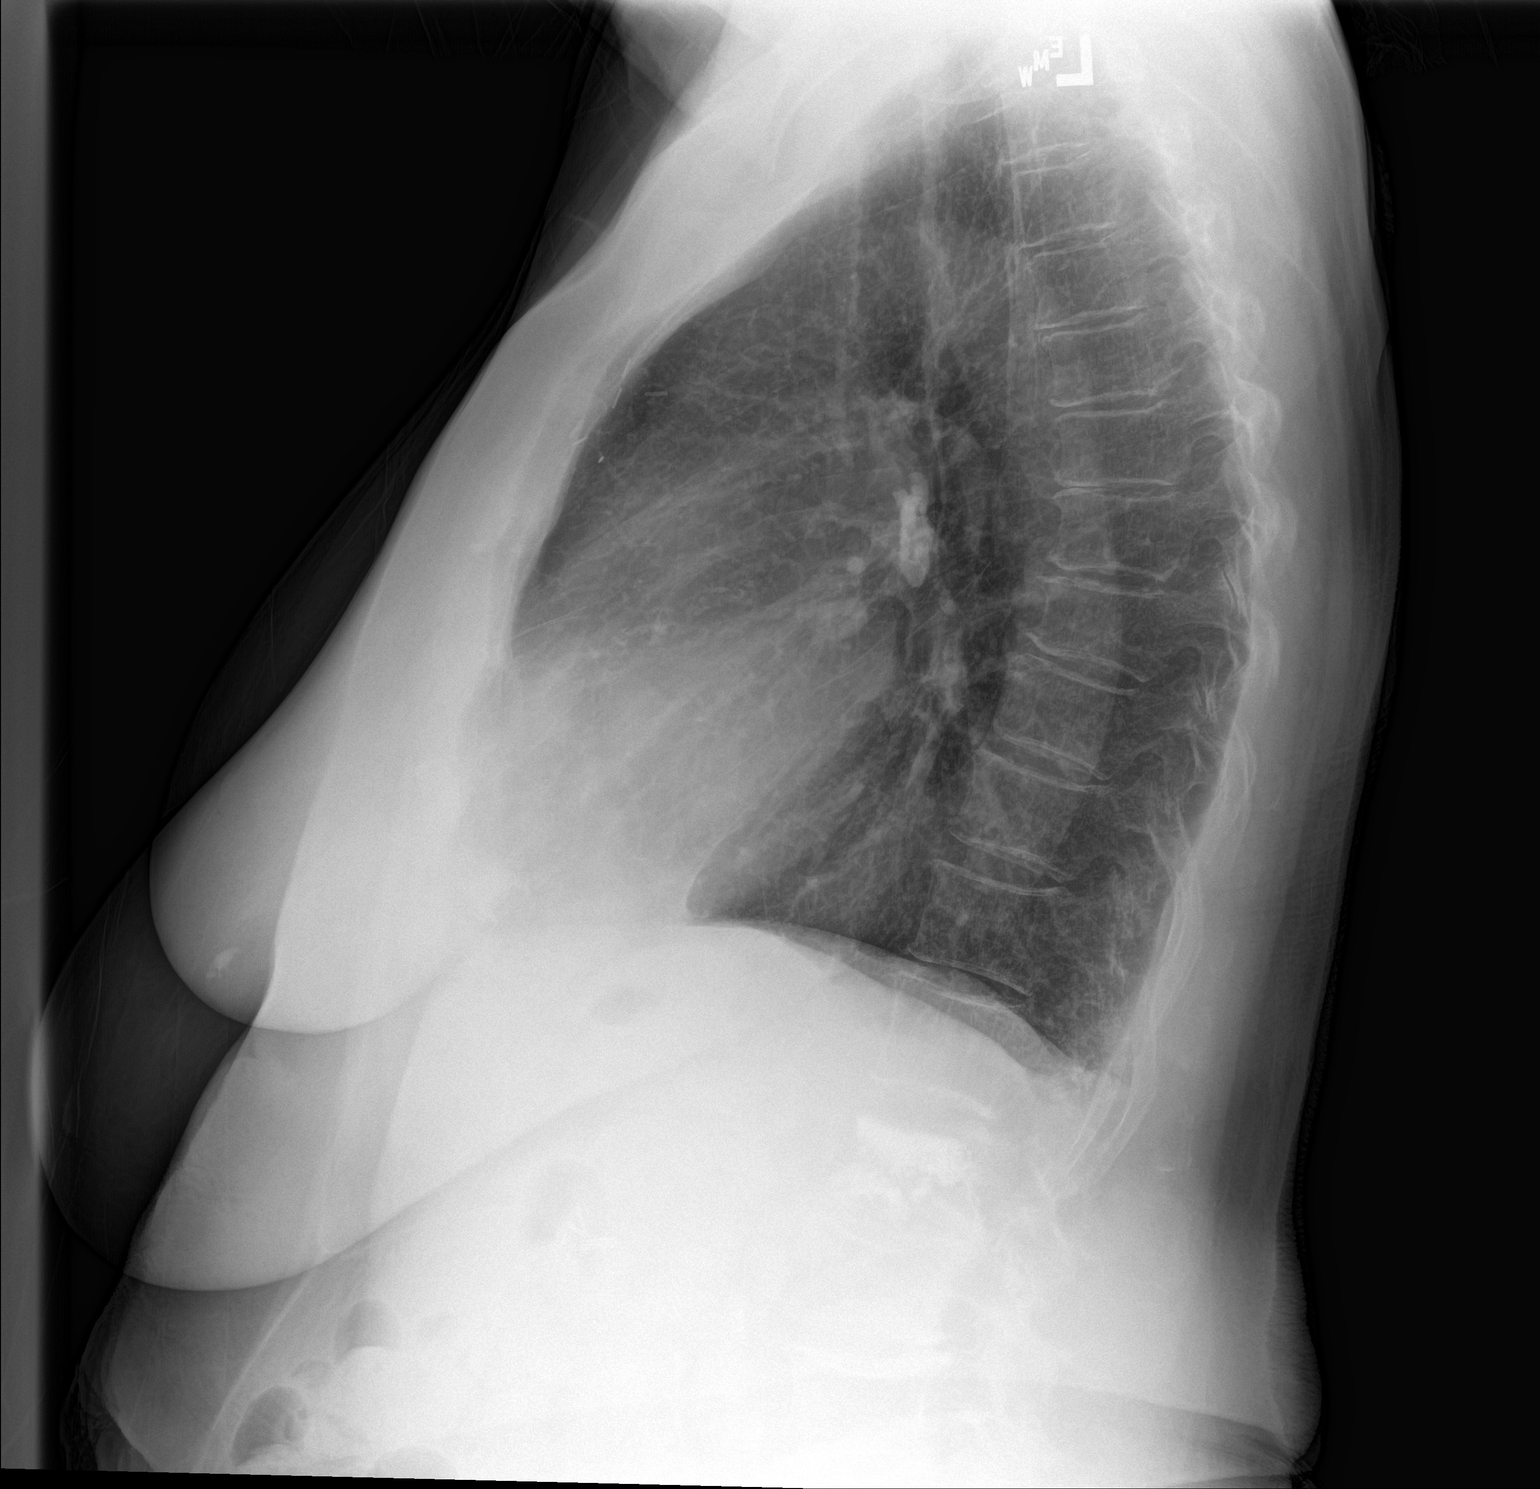

[2 of 2 positions shown; findings below may reference images not displayed]

FINDINGS: Cardiac silhouette normal in size, unchanged. Thoracic aorta mildly
tortuous and atherosclerotic, unchanged. Moderate-sized hiatal
hernia, unchanged. Hilar and mediastinal contours otherwise
unremarkable. Lungs clear. Bronchovascular markings normal.
Pulmonary vascularity normal. No visible pleural effusions. No
pneumothorax. Surgical clips in the left breast from prior
lumpectomy. Osseous demineralization, degenerative changes
throughout the thoracic spine and prior augmentation of L1 as noted
previously.
IMPRESSION: 1.  No acute cardiopulmonary disease.
2. Moderate-sized hiatal hernia.

## 2018-02-12 ENCOUNTER — Telehealth: Payer: Self-pay

## 2018-02-12 NOTE — Telephone Encounter (Signed)
Copied from [place] [id number]. Topic: Appointment Scheduling - Scheduling Inquiry for Clinic >> Feb 12, 2018 12:28 PM Sheran Luz wrote: Reason for CRM: Pt called to schedule an appointment with Dr. Larose Kells on 10/03, stating Dr. Larose Kells said he wanted to see her if her symptoms did not improve. 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 Scheduled pt for Friday 10/04.

## 2018-02-12 NOTE — Telephone Encounter (Signed)
Please advise- you are already full at 9 tomorrow.

## 2018-02-12 NOTE — Telephone Encounter (Signed)
Recommend to keep her appointment for Friday however if she has severe symptoms I could see her as work-in tomorrow at 11.

## 2018-02-13 ENCOUNTER — Other Ambulatory Visit: Payer: Self-pay

## 2018-02-13 NOTE — Telephone Encounter (Signed)
Can you call Pt to check on her?

## 2018-02-13 NOTE — Telephone Encounter (Signed)
Author phoned pt. To check on status. Pt. stated her side pain only lasted two hours yesterday, and she feels OK now. Pt. still scheduled for 10/4 appointment with Dr. Larose Kells, and pt. Stated she still planned on coming.

## 2018-02-14 ENCOUNTER — Encounter: Payer: Self-pay | Admitting: Internal Medicine

## 2018-02-14 ENCOUNTER — Ambulatory Visit (INDEPENDENT_AMBULATORY_CARE_PROVIDER_SITE_OTHER): Payer: Medicare Other | Admitting: Internal Medicine

## 2018-02-14 ENCOUNTER — Encounter

## 2018-02-14 VITALS — BP 126/74 | HR 55 | Temp 98.2°F | Resp 16 | Ht 66.0 in | Wt 150.5 lb

## 2018-02-14 DIAGNOSIS — Z23 Encounter for immunization: Secondary | ICD-10-CM

## 2018-02-14 DIAGNOSIS — R1032 Left lower quadrant pain: Secondary | ICD-10-CM | POA: Diagnosis not present

## 2018-02-14 DIAGNOSIS — I2510 Atherosclerotic heart disease of native coronary artery without angina pectoris: Secondary | ICD-10-CM | POA: Diagnosis not present

## 2018-02-14 NOTE — Progress Notes (Signed)
Subjective:    Patient ID: [name], female    DOB: 12-10-1929, 84 y.o.   MRN: [id number]  DOS:  02/14/2018 Type of visit - description : acute Interval history: Since the last office visit she continue with LLQ pain. Is located at the distal LLQ, reports sudden episodes of pain without radiation, they last a couple of hours.  Not worse by eating, actually may be better with food. No associated nausea. Not triggered by bending or twisting her torso.   Review of Systems Denies fever chills No nausea or vomiting She tried Bentyl and actually did not help and cause diarrhea. No blood in the stools No dysuria or gross hematuria No back pain. No bulging area at the LLQ. No cough   Past Medical History:  Diagnosis Date  . CAD (coronary artery disease)    mild nonobstructive disease on cath in 2003  . Cancer ([place])   . Cardiomyopathy    Probable Takotsubo, severe CP w/ normal cath in 1994. Severe CP in 2003 w/ widespread T wave inversions on ECG. Cath w/ minimal coronary disease but LV-gram showed periapical severe hypokinesis and basilar hyperkinesia (EF 40%). Last echo in 4/09 confirmed full LV functional recovery with EF 60%, no regional wall motion abnormalities, mild to moderate LVH.  Marland Kitchen CVA (cerebral infarction)   . Depression with anxiety 03-29-11   lost husband 3'09  . Diverticulitis   . DVT (deep venous thrombosis) ([place])    after venous ablation, R leg  . E. coli gastroenteritis 03-29-11   8'10  . Fracture 09/10/07   L2, status post vertebroplasty of L2 performed by IR  . GERD (gastroesophageal reflux disease)   . Headache(784.0)    migraines  . Hemorrhoids   . Hiatal hernia 03-29-11   no nerve problems  . Hyperlipidemia   . Hyperplastic colon polyp 06/2001  . Hypertension   . Iron deficiency anemia due to chronic blood loss 12/30/2017  . OA (osteoarthritis) of knee 03-29-11   w/ bilateral knee pain-not a problem now  . OSA (obstructive sleep apnea)  03-29-11   no cpap used, not a problem now.  . Osteoporosis   . Otalgia of both ears    Dr. Simeon Craft  . Polycythemia   . Stroke [facility]) 03-29-11   CVA x2 -last 10'12-?TIA(visual problems)  . Varicose vein     Past Surgical History:  Procedure Laterality Date  . APPENDECTOMY    . BACK SURGERY    . BREAST BIOPSY    . BREAST CYST EXCISION    . BREAST LUMPECTOMY Left 2013   left stage I left breast cancer  . CHOLECYSTECTOMY  04/09/2011   Procedure: LAPAROSCOPIC CHOLECYSTECTOMY WITH INTRAOPERATIVE CHOLANGIOGRAM;  Surgeon: Odis Hollingshead, MD;  Location: [facility];  Service: General;  Laterality: N/A;  . Dental Extraction     L maxillary molar  . KYPHOSIS SURGERY  08/2007   cement used  . OVARIAN CYST SURGERY     left  . TONSILLECTOMY    . TOTAL KNEE ARTHROPLASTY  06/2010   left  . TUBAL LIGATION      Social History   Socioeconomic History  . Marital status: Widowed    Spouse name: Not on file  . Number of children: 3  . Years of education: Not on file  . Highest education level: Not on file  Occupational History  . Occupation: n/a  Social Needs  . Financial resource strain: Not on file  . Food insecurity:  Worry: Not on file    Inability: Not on file  . Transportation needs:    Medical: Not on file    Non-medical: Not on file  Tobacco Use  . Smoking status: Former Smoker    Packs/day: 1.00    Years: 37.00    Pack years: 37.00    Types: Cigarettes    Start date: 06/26/1951    Last attempt to quit: 05/14/1988    Years since quitting: 29.7  . Smokeless tobacco: Never Used  . Tobacco comment: quit 25 years ago  Substance and Sexual Activity  . Alcohol use: Yes    Alcohol/week: 1.0 standard drinks    Types: 1 Cans of beer per week    Comment: occasional/social  . Drug use: No  . Sexual activity: Not Currently  Lifestyle  . Physical activity:    Days per week: Not on file    Minutes per session: Not on file  . Stress: Not on file  Relationships  . Social  connections:    Talks on phone: Not on file    Gets together: Not on file    Attends religious service: Not on file    Active member of club or organization: Not on file    Attends meetings of clubs or organizations: Not on file    Relationship status: Not on file  . Intimate partner violence:    Fear of current or ex partner: Not on file    Emotionally abused: Not on file    Physically abused: Not on file    Forced sexual activity: Not on file  Other Topics Concern  . Not on file  Social History Narrative   Lost husband 07/29/07-    Lives in [place] in a town house by herself, has 3 daughters, lost Mickel Baas (03-2017)   Early Chars lives in [place], [place] ( [place])     Still drives                Allergies as of 02/14/2018      Reactions   Penicillins Hives, Rash, Other (See Comments)   Has patient had a PCN reaction causing immediate rash, facial/tongue/throat swelling, SOB or lightheadedness with hypotension: Yes Has patient had a PCN reaction causing severe rash involving mucus membranes or skin necrosis: yes Has patient had a PCN reaction that required hospitalization: No Has patient had a PCN reaction occurring within the last 10 years: no If all of the above answers are "NO", then may proceed with Cephalosporin use. Other Reaction: OTHER REACTION   Atorvastatin Diarrhea   REACTION: diarrhe   Ezetimibe    REACTION: nausea   Fluvastatin Sodium Other (See Comments)   "Can't remember"   Magnesium Hydroxide Other (See Comments)   REACTION: triggers HAs   Meloxicam Other (See Comments)   Pt seeing auro's / spots.     Omeprazole Magnesium    auras   Pneumovax [pneumococcal Polysaccharide Vaccine]    Local reaction   Quinapril Hcl Other (See Comments)   03-29-11 "feelings of tiredness"   Simvastatin Diarrhea   Topamax [topiramate] Itching   Valsartan Itching, Other (See Comments)   REACTION: toes itch   Vit D-vit E-safflower Oil Other (See Comments)   Headaches    Carvedilol    "teeth hurt when I took it" "teeth hurt when I took it"      Medication List        Accurate as of 02/14/18 11:59 PM. Always use your most recent  med list.          acetaminophen-codeine 300-30 MG tablet Commonly known as:  TYLENOL #3 Take 1 tablet by mouth every 8 (eight) hours as needed. For migraine   aspirin 81 MG tablet Take 81 mg by mouth daily.   co-enzyme Q-10 30 MG capsule Take 30 mg by mouth daily.   dicyclomine 10 MG capsule Commonly known as:  BENTYL Take 1 capsule (10 mg total) by mouth 4 (four) times daily -  before meals and at bedtime.   FOLIC ACID PO Take by mouth.   hydrochlorothiazide 25 MG tablet Commonly known as:  HYDRODIURIL Take 1 tablet (25 mg total) by mouth daily as needed (swelling in the legs ).   hydroxyurea 500 MG capsule Commonly known as:  HYDREA TAKE 1 CAPSULE BY MOUTH EVERY OTHER DAY TAKE WITH FOOD TO MINIMIZE SIDE EFFECT   LORazepam 0.5 MG tablet Commonly known as:  ATIVAN TAKE 1/2 TABLET BY MOUTH EVERY MORNING AND EVERY AFTERNOON. TAKE 2 TABLETS BY MOUTH EVERY NIGHT AT BEDTIME AS NEEDED FOR ANXIETY   metoprolol tartrate 50 MG tablet Commonly known as:  LOPRESSOR Take 1 tablet (50 mg total) by mouth 2 (two) times daily.   omeprazole 20 MG capsule Commonly known as:  PRILOSEC Take 20 mg by mouth daily.          Objective:   Physical Exam BP 126/74 (BP Location: Left Arm, Patient Position: Sitting, Cuff Size: Small)   Pulse (!) 55   Temp 98.2 F (36.8 C) (Oral)   Resp 16   Ht 5\' 6"  (1.676 m)   Wt 150 lb 8 oz (68.3 kg)   SpO2 97%   BMI 24.29 kg/m  General:   Well developed, NAD, see BMI.  HEENT:  Normocephalic . Face symmetric, atraumatic Lungs:  CTA B Normal respiratory effort, no intercostal retractions, no accessory muscle use. Heart: RRR,  no murmur.  no pretibial edema bilaterally  Abdomen:  Not distended, soft, non-tender. No rebound or rigidity. DJD: Left hip rotation normal, did not  elicit pain Skin: Not pale. Not jaundice Neurologic:  alert & oriented X3.  Speech normal, gait appropriate for age and unassisted Psych--  Cognition and judgment appear intact.  Cooperative with normal attention span and concentration.  Behavior appropriate. No anxious or depressed appearing.     Assessment & Plan:  Assessment  HTN Dyslipidemia: Intolerant to most medicines including Zetia, Pravachol, Niaspan Depression, anxiety , insomnia - on ativan bid (03-2015 citalopram cause drowsiness, 04-2015 prozac diarrhea) Hem-Onc: POLYCYTEMIA-- sees hem-onc H/o breast ca  MSK: ---DJD :  Tylenol #3, uses rarely ---Osteoporosis:  T score -2.8 (02-2013): declined treatment 06-2017 ---L2 fractures, s/p vertebral plasty 2009 Migraine headaches - Tylenol #3 (rarely)  GI: GERD- intolerant to prevacid, visual disturbances HH, diverticulitis, Colon polyps  --h/o "functional " LLQ abd pain CV: ---Cardiomyopathy Probable Takotsubo cardiomyopathy: CP w/ normal cath in 1994.   Severe CP  2003 with widespread T wave inversions on ECG.  Cath with minimal coronary disease but LV-gram showed periapical severe hypokinesis and basilar hyperkinesia (EF 40%) >>> ECHO 4/09 confirmed full LV functional recovery with EF 60%, mild LVH. 2012: Nl EF per echo, (-) Lexiscan ---Stroke / TIA 2012 Sleep apnea 2012, , not on CPAP H/oDVT, right leg after venous ablation   PLAN LLQ abdominal pain: Symptoms of started in early September, was seen here 01/21/18.  Blood work, UA and urine culture were essentially normal. She continue with symptoms as described above,  history of functional LLQ abdominal pain, similar complaints 06-2017, CT abdomen then was negative. Etiology not clear, symptoms not consistent with diverticulitis, GI angina, kidney stones, hip DJD or other serious conditions.  Functional?. Will get a US pelvis-abdomen   If symptoms continue, refer to gyn Flu shot today

## 2018-02-14 NOTE — Progress Notes (Signed)
Pre visit review using our clinic review tool, if applicable. No additional management support is needed unless otherwise documented below in the visit note. 

## 2018-02-14 NOTE — Patient Instructions (Signed)
We will schedule an ultrasound of the abdomen and pelvis  If you get severe pains please let me know

## 2018-02-16 NOTE — Assessment & Plan Note (Signed)
LLQ abdominal pain: Symptoms of started in early September, was seen here 01/21/18.  Blood work, UA and urine culture were essentially normal. She continue with symptoms as described above, history of functional LLQ abdominal pain, similar complaints 06-2017, CT abdomen then was negative. Etiology not clear, symptoms not consistent with diverticulitis, GI angina, kidney stones, hip DJD or other serious conditions.  Functional?. Will get a US pelvis-abdomen   If symptoms continue, refer to gyn Flu shot today

## 2018-02-18 ENCOUNTER — Ambulatory Visit (HOSPITAL_BASED_OUTPATIENT_CLINIC_OR_DEPARTMENT_OTHER)
Admission: RE | Admit: 2018-02-18 | Discharge: 2018-02-18 | Disposition: A | Discharge status: Home / Self Care | Payer: Medicare Other | Source: Ambulatory Visit | Attending: Internal Medicine | Admitting: Internal Medicine

## 2018-02-18 DIAGNOSIS — K838 Other specified diseases of biliary tract: Secondary | ICD-10-CM | POA: Insufficient documentation

## 2018-02-18 DIAGNOSIS — R1032 Left lower quadrant pain: Secondary | ICD-10-CM | POA: Insufficient documentation

## 2018-02-18 DIAGNOSIS — D259 Leiomyoma of uterus, unspecified: Secondary | ICD-10-CM | POA: Insufficient documentation

## 2018-02-18 DIAGNOSIS — D7389 Other diseases of spleen: Secondary | ICD-10-CM | POA: Insufficient documentation

## 2018-02-18 DIAGNOSIS — I700 Atherosclerosis of aorta: Secondary | ICD-10-CM | POA: Insufficient documentation

## 2018-02-18 DIAGNOSIS — Z9049 Acquired absence of other specified parts of digestive tract: Secondary | ICD-10-CM | POA: Diagnosis not present

## 2018-02-18 DIAGNOSIS — R932 Abnormal findings on diagnostic imaging of liver and biliary tract: Secondary | ICD-10-CM | POA: Diagnosis not present

## 2018-03-13 ENCOUNTER — Encounter: Payer: Self-pay | Admitting: Hematology & Oncology

## 2018-03-13 ENCOUNTER — Other Ambulatory Visit: Payer: Self-pay

## 2018-03-13 ENCOUNTER — Inpatient Hospital Stay: Payer: Medicare Other

## 2018-03-13 ENCOUNTER — Inpatient Hospital Stay: Payer: Medicare Other | Attending: Hematology & Oncology | Admitting: Hematology & Oncology

## 2018-03-13 VITALS — BP 142/62 | HR 59 | Temp 98.2°F | Resp 18 | Wt 151.0 lb

## 2018-03-13 DIAGNOSIS — D45 Polycythemia vera: Secondary | ICD-10-CM | POA: Insufficient documentation

## 2018-03-13 DIAGNOSIS — C50412 Malignant neoplasm of upper-outer quadrant of left female breast: Secondary | ICD-10-CM

## 2018-03-13 DIAGNOSIS — R21 Rash and other nonspecific skin eruption: Secondary | ICD-10-CM | POA: Diagnosis not present

## 2018-03-13 DIAGNOSIS — D500 Iron deficiency anemia secondary to blood loss (chronic): Secondary | ICD-10-CM

## 2018-03-13 DIAGNOSIS — Z170 Estrogen receptor positive status [ER+]: Secondary | ICD-10-CM

## 2018-03-13 DIAGNOSIS — Z853 Personal history of malignant neoplasm of breast: Secondary | ICD-10-CM | POA: Insufficient documentation

## 2018-03-13 LAB — CBC WITH DIFFERENTIAL (CANCER CENTER ONLY)
Abs Immature Granulocytes: 0.01 10*3/uL (ref 0.00–0.07)
Basophils Absolute: 0.1 10*3/uL (ref 0.0–0.1)
Basophils Relative: 1 %
Eosinophils Absolute: 0.1 10*3/uL (ref 0.0–0.5)
Eosinophils Relative: 2 %
HCT: 50 % — ABNORMAL HIGH (ref 36.0–46.0)
Hemoglobin: 15.3 g/dL — ABNORMAL HIGH (ref 12.0–15.0)
Immature Granulocytes: 0 %
Lymphocytes Relative: 14 %
Lymphs Abs: 0.9 10*3/uL (ref 0.7–4.0)
MCH: 28 pg (ref 26.0–34.0)
MCHC: 30.6 g/dL (ref 30.0–36.0)
MCV: 91.6 fL (ref 80.0–100.0)
Monocytes Absolute: 0.5 10*3/uL (ref 0.1–1.0)
Monocytes Relative: 8 %
Neutro Abs: 4.8 10*3/uL (ref 1.7–7.7)
Neutrophils Relative %: 75 %
Platelet Count: 443 10*3/uL — ABNORMAL HIGH (ref 150–400)
RBC: 5.46 MIL/uL — ABNORMAL HIGH (ref 3.87–5.11)
RDW: 17.7 % — ABNORMAL HIGH (ref 11.5–15.5)
WBC Count: 6.3 10*3/uL (ref 4.0–10.5)
nRBC: 0 % (ref 0.0–0.2)

## 2018-03-13 MED ORDER — MOMETASONE FUROATE 0.1 % EX CREA: 1.0000 "application " | TOPICAL_CREAM | Freq: Every day | CUTANEOUS | 0 refills | Status: DC

## 2018-03-13 MED FILL — MOMETASONE FUROATE 0.1% CRE: 0.1 | 7 days supply | Qty: 45 | Fill #0

## 2018-03-13 NOTE — Progress Notes (Signed)
Hematology and Oncology Follow Up Visit  [name] [id number] 11/10/1929 84 y.o. 03/13/2018   Principle Diagnosis:  Polycythemia vera - JAK2 positive Stage I ([id number] ) infiltrating duct carcinoma the left breast   Past Therapy: Femara 2.5 mg by mouth daily - stopped 08/2015  Current Therapy:   Phlebotomy to maintain hematocrit below 45% Hydrea 500 mg by mouth every other day    Interim History:  [name] is here today for follow-up.  She is not feeling all that well.  She has this localized rash on her upper chest.  She thought that this was from the [facility].  I thought it would be unusual for this to be from the [facility] since she has been Hydrea for 6 years and has never had a problem.  I wrote her a prescription for a high potency steroid cream to see if this would help.  I told her that if this steroid cream did not help, then I would recommend she see her family doctor or dermatologist.    Her hematocrit is 50.  She definitely needs to be phlebotomized.  She is had no fever.  She is had no cough or shortness of breath.  There is been no nausea or vomiting.  She is had no change in bowel or bladder habits.  There is been no leg swelling.  Overall, her performance status is ECOG 1.    Medications:  Allergies as of 03/13/2018      Reactions   Penicillins Hives, Rash, Other (See Comments)   Has patient had a PCN reaction causing immediate rash, facial/tongue/throat swelling, SOB or lightheadedness with hypotension: Yes Has patient had a PCN reaction causing severe rash involving mucus membranes or skin necrosis: yes Has patient had a PCN reaction that required hospitalization: No Has patient had a PCN reaction occurring within the last 10 years: no If all of the above answers are "NO", then may proceed with Cephalosporin use. Other Reaction: OTHER REACTION   Atorvastatin Diarrhea   REACTION: diarrhe   Ezetimibe    REACTION: nausea   Fluvastatin Sodium Other (See  Comments)   "Can't remember"   Magnesium Hydroxide Other (See Comments)   REACTION: triggers HAs   Meloxicam Other (See Comments)   Pt seeing auro's / spots.     Omeprazole Magnesium    auras   Pneumovax [pneumococcal Polysaccharide Vaccine]    Local reaction   Quinapril Hcl Other (See Comments)   03-29-11 "feelings of tiredness"   Simvastatin Diarrhea   Topamax [topiramate] Itching   Valsartan Itching, Other (See Comments)   REACTION: toes itch   Vit D-vit E-safflower Oil Other (See Comments)   Headaches   Carvedilol    "teeth hurt when I took it" "teeth hurt when I took it"      Medication List        Accurate as of 03/13/18  3:26 PM. Always use your most recent med list.          acetaminophen-codeine 300-30 MG tablet Commonly known as:  TYLENOL #3 Take 1 tablet by mouth every 8 (eight) hours as needed. For migraine   aspirin 81 MG tablet Take 81 mg by mouth daily.   co-enzyme Q-10 30 MG capsule Take 30 mg by mouth daily.   dicyclomine 10 MG capsule Commonly known as:  BENTYL Take 1 capsule (10 mg total) by mouth 4 (four) times daily -  before meals and at bedtime.   FOLIC ACID PO Take by mouth.  hydrochlorothiazide 25 MG tablet Commonly known as:  HYDRODIURIL Take 1 tablet (25 mg total) by mouth daily as needed (swelling in the legs ).   hydroxyurea 500 MG capsule Commonly known as:  HYDREA TAKE 1 CAPSULE BY MOUTH EVERY OTHER DAY TAKE WITH FOOD TO MINIMIZE SIDE EFFECT   LORazepam 0.5 MG tablet Commonly known as:  ATIVAN TAKE 1/2 TABLET BY MOUTH EVERY MORNING AND EVERY AFTERNOON. TAKE 2 TABLETS BY MOUTH EVERY NIGHT AT BEDTIME AS NEEDED FOR ANXIETY   metoprolol tartrate 50 MG tablet Commonly known as:  LOPRESSOR Take 1 tablet (50 mg total) by mouth 2 (two) times daily.   omeprazole 20 MG capsule Commonly known as:  PRILOSEC Take 20 mg by mouth daily.       Allergies:  Allergies  Allergen Reactions  . Penicillins Hives, Rash and Other (See  Comments)    Has patient had a PCN reaction causing immediate rash, facial/tongue/throat swelling, SOB or lightheadedness with hypotension: Yes Has patient had a PCN reaction causing severe rash involving mucus membranes or skin necrosis: yes Has patient had a PCN reaction that required hospitalization: No Has patient had a PCN reaction occurring within the last 10 years: no If all of the above answers are "NO", then may proceed with Cephalosporin use.  Other Reaction: OTHER REACTION  . Atorvastatin Diarrhea    REACTION: diarrhe  . Ezetimibe     REACTION: nausea  . Fluvastatin Sodium Other (See Comments)    "Can't remember"  . Magnesium Hydroxide Other (See Comments)    REACTION: triggers HAs  . Meloxicam Other (See Comments)    Pt seeing auro's / spots.    . Omeprazole Magnesium     auras  . Pneumovax [Pneumococcal Polysaccharide Vaccine]     Local reaction  . Quinapril Hcl Other (See Comments)    03-29-11 "feelings of tiredness"  . Simvastatin Diarrhea  . Topamax [Topiramate] Itching  . Valsartan Itching and Other (See Comments)    REACTION: toes itch  . Vit D-Vit E-Safflower Oil Other (See Comments)    Headaches  . Carvedilol     "teeth hurt when I took it" "teeth hurt when I took it"    Past Medical History, Surgical history, Social history, and Family History were reviewed and updated.  Review of Systems: Review of Systems  Constitutional: Negative.   HENT: Negative.   Eyes: Negative.   Respiratory: Negative.   Cardiovascular: Negative.   Gastrointestinal: Negative.   Genitourinary: Negative.   Musculoskeletal: Negative.   Skin: Negative.   Neurological: Negative.   Endo/Heme/Allergies: Negative.   Psychiatric/Behavioral: Negative.      Physical Exam:  weight is 151 lb (68.5 kg). Her oral temperature is 98.2 F (36.8 C). Her blood pressure is 142/62 (abnormal) and her pulse is 59 (abnormal). Her respiration is 18 and oxygen saturation is 99%.   Wt  Readings from Last 3 Encounters:  03/13/18 151 lb (68.5 kg)  02/14/18 150 lb 8 oz (68.3 kg)  01/21/18 148 lb 4 oz (67.2 kg)    Physical Exam  Constitutional: She is oriented to person, place, and time.  HENT:  Head: Normocephalic and atraumatic.  Mouth/Throat: Oropharynx is clear and moist.  Eyes: Pupils are equal, round, and reactive to light. EOM are normal.  Neck: Normal range of motion.  Cardiovascular: Normal rate, regular rhythm and normal heart sounds.  Pulmonary/Chest: Effort normal and breath sounds normal.  Abdominal: Soft. Bowel sounds are normal.  Musculoskeletal: Normal range of motion. She  exhibits no edema, tenderness or deformity.  Lymphadenopathy:    She has no cervical adenopathy.  Neurological: She is alert and oriented to person, place, and time.  Skin: Skin is warm and dry. No rash noted. No erythema.  Psychiatric: She has a normal mood and affect. Her behavior is normal. Judgment and thought content normal.  Vitals reviewed.    Lab Results  Component Value Date   WBC 6.3 03/13/2018   HGB 15.3 (H) 03/13/2018   HCT 50.0 (H) 03/13/2018   MCV 91.6 03/13/2018   PLT 443 (H) 03/13/2018   Lab Results  Component Value Date   FERRITIN 18 11/07/2017   IRON 31 11/07/2017   TIBC 462 (H) 11/07/2017   UIBC 431 11/07/2017   IRONPCTSAT 7 (L) 11/07/2017   Lab Results  Component Value Date   RETICCTPCT 0.8 02/02/2015   RBC 5.46 (H) 03/13/2018   RETICCTABS 41.4 02/02/2015   No results found for: KPAFRELGTCHN, LAMBDASER, KAPLAMBRATIO No results found for: IGGSERUM, IGA, IGMSERUM No results found for: Odetta Pink, SPEI   Chemistry      Component Value Date/Time   NA 138 01/21/2018 1621   NA 142 03/25/2017 1457   NA 139 04/10/2016 0855   K 4.7 01/21/2018 1621   K 4.1 03/25/2017 1457   K 4.2 04/10/2016 0855   CL 100 01/21/2018 1621   CL 96 (L) 03/25/2017 1457   CO2 31 01/21/2018 1621   CO2 32  03/25/2017 1457   CO2 29 04/10/2016 0855   BUN 9 01/21/2018 1621   BUN 7 03/25/2017 1457   BUN 10.0 04/10/2016 0855   CREATININE 0.81 01/21/2018 1621   CREATININE 0.70 12/30/2017 1311   CREATININE 1.0 03/25/2017 1457   CREATININE 0.8 04/10/2016 0855      Component Value Date/Time   CALCIUM 10.1 01/21/2018 1621   CALCIUM 9.8 03/25/2017 1457   CALCIUM 9.5 04/10/2016 0855   ALKPHOS 59 12/30/2017 1311   ALKPHOS 86 (H) 03/25/2017 1457   ALKPHOS 85 04/10/2016 0855   AST 23 12/30/2017 1311   AST 19 04/10/2016 0855   ALT 20 12/30/2017 1311   ALT 15 03/25/2017 1457   ALT 13 04/10/2016 0855   BILITOT 1.5 12/30/2017 1311   BILITOT 1.26 (H) 04/10/2016 0855       Impression and Plan: Ms. Freshour is a very pleasant 84 yo caucasian female with polycythemia and history of stage I infiltrating ductal carcinoma of the left breast with lumpectomy in 2013.   She completed treatment with Femara in April 2017.   We will go ahead and phlebotomize her.  We will see if this can help make her feel a little bit better.  I will not stop the Hydrea for right now.  I think this is helping could to control her platelet count.  I will have her come back to see [place] in about 6 weeks.  Volanda Napoleon, MD 10/31/20193:26 PM

## 2018-03-14 LAB — CMP (CANCER CENTER ONLY)
ALT: 10 U/L (ref 0–44)
AST: 23 U/L (ref 15–41)
Albumin: 3.9 g/dL (ref 3.5–5.0)
Alkaline Phosphatase: 71 U/L (ref 38–126)
Anion gap: 10 (ref 5–15)
BUN: 6 mg/dL — ABNORMAL LOW (ref 8–23)
CO2: 28 mmol/L (ref 22–32)
Calcium: 9.8 mg/dL (ref 8.9–10.3)
Chloride: 102 mmol/L (ref 98–111)
Creatinine: 0.86 mg/dL (ref 0.44–1.00)
GFR, Est AFR Am: >60 mL/min (ref >60)
GFR, Estimated: 59 mL/min — ABNORMAL LOW (ref >60)
Glucose, Bld: 96 mg/dL (ref 70–99)
Potassium: 5.2 mmol/L — ABNORMAL HIGH (ref 3.5–5.1)
Sodium: 140 mmol/L (ref 135–145)
Total Bilirubin: 1.4 mg/dL — ABNORMAL HIGH (ref 0.3–1.2)
Total Protein: 7.1 g/dL (ref 6.5–8.1)

## 2018-03-14 LAB — IRON AND TIBC
Iron: 45 ug/dL (ref 41–142)
Saturation Ratios: 11 % — ABNORMAL LOW (ref 21–57)
TIBC: 400 ug/dL (ref 236–444)
UIBC: 356 ug/dL (ref 120–384)

## 2018-03-14 LAB — FERRITIN: Ferritin: 25 ng/mL (ref 11–307)

## 2018-03-26 ENCOUNTER — Ambulatory Visit: Payer: Medicare Other | Admitting: Medical

## 2018-04-10 ENCOUNTER — Telehealth: Payer: Self-pay | Admitting: Internal Medicine

## 2018-04-10 DIAGNOSIS — F341 Dysthymic disorder: Secondary | ICD-10-CM

## 2018-04-14 ENCOUNTER — Ambulatory Visit: Payer: Self-pay | Admitting: *Deleted

## 2018-04-14 NOTE — Telephone Encounter (Signed)
Sent!

## 2018-04-14 NOTE — Telephone Encounter (Signed)
Author phoned pt. To follow up on CP symptoms, no answer, unable to leave VM. Chief Strategy Officer then phoned [place] at home phone, no answer, unable to leave VM. Author phoned celia's mobile, and left VM asking for celia to follow-up on pt's complaints and encourage pt. to go to ED per triage nurse recommendations. Routed to Dr. Larose Kells as Juluis Rainier.

## 2018-04-14 NOTE — Telephone Encounter (Signed)
Pt reports intermittent chest pain "Angina" onset Wednesday 04/09/18. States pain is between breasts, radiates to left arm at times. States "Was bad Friday and Saturday."  States has not had chest pain this am but woke this AM with 9/10 constant back pain, "Right above my bra line." States "A doctor told me if back pain is above my bra line it's angina." Denies SOB, nausea, diaphoresis.  States "Took a baby aspirin when the chest pain occurred." States she had taken nitro before but  It "Gives me a headache so I don't take it anymore." Also reports BP elevated Friday 169/107  "But I was in pain."  This am 147/87. Pt is evasive historian. Pt directed to ED, states "I don't want to do that because they'll keep me." Reiterated need to go to ED for thorough evaluation. States she is unsure if she'll do so. Made pt aware [place] would alert Dr. Larose Kells.   Reason for Disposition . Pain also present in shoulder(s) or arm(s) or jaw  (Exception: pain is clearly made worse by movement)  Answer Assessment - Initial Assessment Questions 1. LOCATION: "Where does it hurt?"       Between breasts,intermittent, and back, above bra line 2. RADIATION: "Does the pain go anywhere else?" (e.g., into neck, jaw, arms, back)     Last night under left arm, briefly 3. ONSET: "When did the chest pain begin?" (Minutes, hours or days)      Last Wednesday; Friday and Saturday worse, took baby aspirin 4. PATTERN "Does the pain come and go, or has it been constant since it started?"  "Does it get worse with exertion?"      Constant in the back, not worse with exertion, intermittent chest 5. DURATION: "How long does it last" (e.g., seconds, minutes, hours)     Constant  6. SEVERITY: "How bad is the pain?"  (e.g., Scale 1-10; mild, moderate, or severe)    - MILD (1-3): doesn't interfere with normal activities     - MODERATE (4-7): interferes with normal activities or awakens from sleep    - SEVERE (8-10): excruciating pain, unable to do any  normal activities       9/10 back 7. CARDIAC RISK FACTORS: "Do you have any history of heart problems or risk factors for heart disease?" (e.g., prior heart attack, angina; high blood pressure, diabetes, being overweight, high cholesterol, smoking, or strong family history of heart disease)     yes 8. PULMONARY RISK FACTORS: "Do you have any history of lung disease?"  (e.g., blood clots in lung, asthma, emphysema, birth control pills)      9. CAUSE: "What do you think is causing the chest pain?"    "Probably my heart." 10. OTHER SYMPTOMS: "Do you have any other symptoms?" (e.g., dizziness, nausea, vomiting, sweating, fever, difficulty breathing, cough)     no  Protocols used: CHEST PAIN-A-AH

## 2018-04-14 NOTE — Telephone Encounter (Signed)
[name] called back, stating, via [facility], that "pt. Is OK, doing fine". Author unable to speak to Early Chars to get clarification as Early Chars had hung up. Routed to [facility], Dr. Ethel Rana CMA as Juluis Rainier.

## 2018-04-14 NOTE — Telephone Encounter (Signed)
Pt is requesting refill on lorazepam.   Last OV: 02/14/2018 Last Fill: 02/06/2018 #90 and 1RF Pt sig: 1/2 tab in morning and every afternoon, 2 tablets at bedtime prn UDS: 03/18/2017 Low risk  NCCR in media from 02/06/2018

## 2018-04-14 NOTE — Telephone Encounter (Signed)
Thank you :)

## 2018-05-04 IMAGING — CT CT ABD-PELV W/ CM
2 of 5 series · 16 of 46 positions shown, 18 images · IV contrast (APPLIED)
Comparison: MRI of the lumbar spine and pelvis on 09/06/2013 and
prior CT of the abdomen and pelvis on 05/29/2013.

CLINICAL DATA: Left lower quadrant abdominal pain and history of
diverticulitis. History of breast carcinoma with prior pathologic
fracture of the pelvis involving right obturator ring right sacral
ala. Also history of lumbar compression fractures.

EXAM:
CT ABDOMEN AND PELVIS WITH CONTRAST
TECHNIQUE: Multidetector CT imaging of the abdomen and pelvis was performed
using the standard protocol following bolus administration of
intravenous contrast.
CONTRAST:  100mL EBJBMA-XWW IOPAMIDOL (EBJBMA-XWW) INJECTION 61%

[Series 2: axial st · axial · 0.91mm/px · z∈[+889,+1269]mm · 13 of 86 slices shown, 15 images]
[im 5/86  soft-tissue]
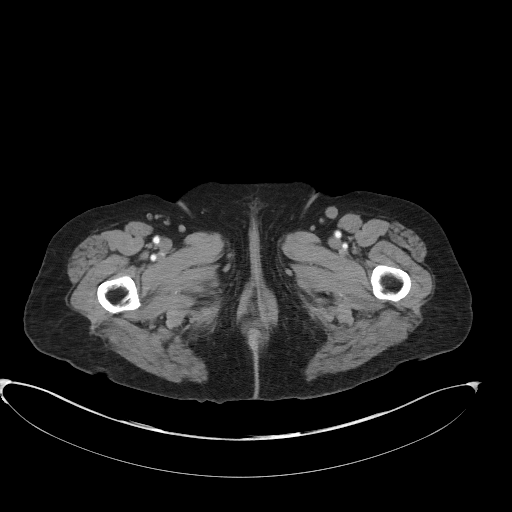
[im 5/86  bone]
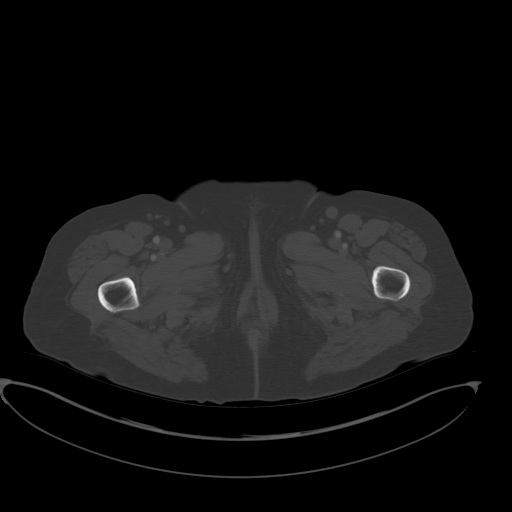
[im 14/86  soft-tissue]
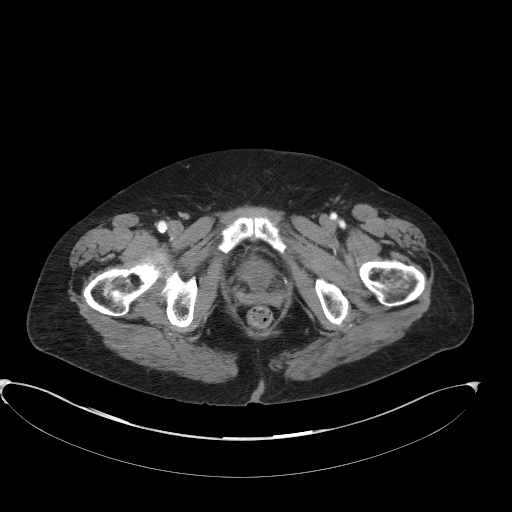
[im 18/86  soft-tissue]
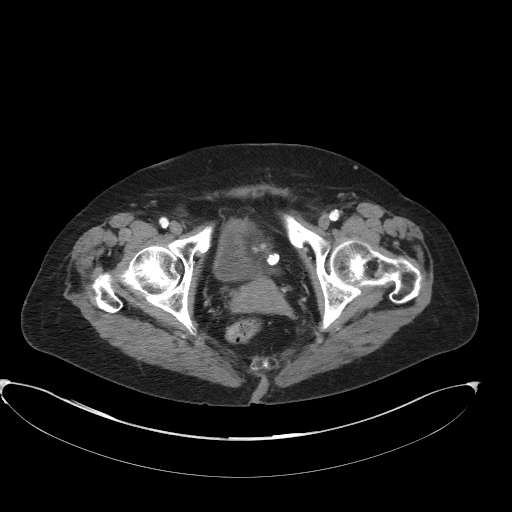
[im 23/86  soft-tissue]
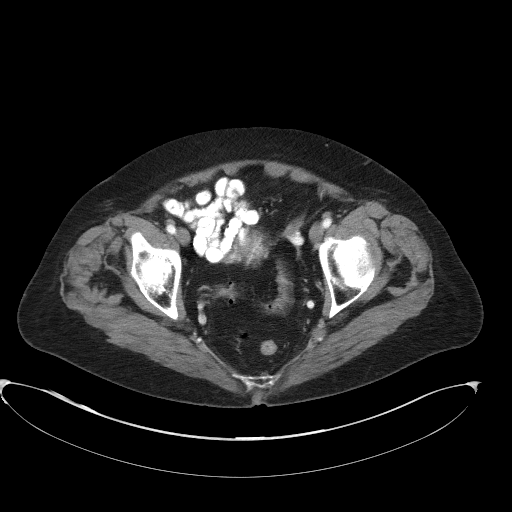
[im 32/86  soft-tissue]
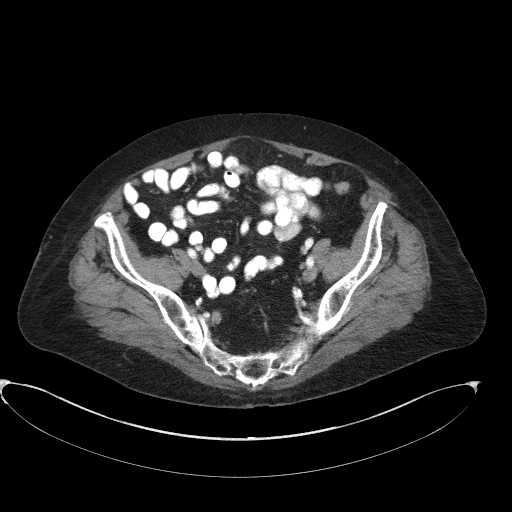
[im 36/86  soft-tissue]
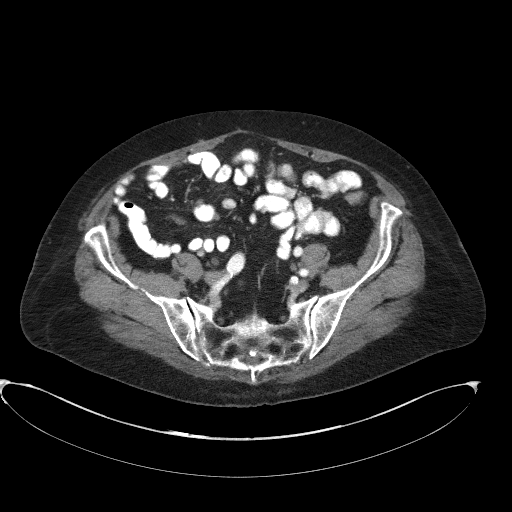
[im 45/86  soft-tissue]
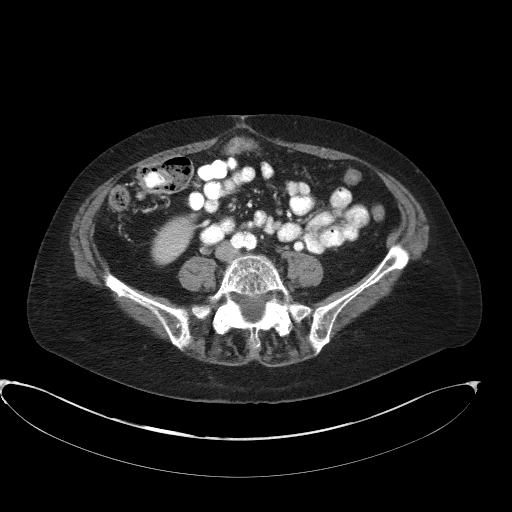
[im 50/86  soft-tissue]
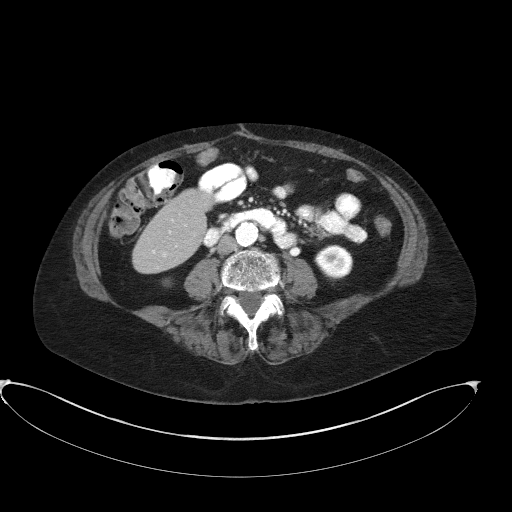
[im 54/86  soft-tissue]
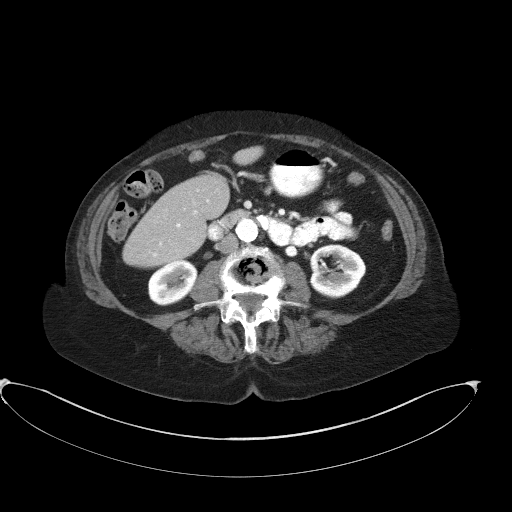
[im 54/86  bone]
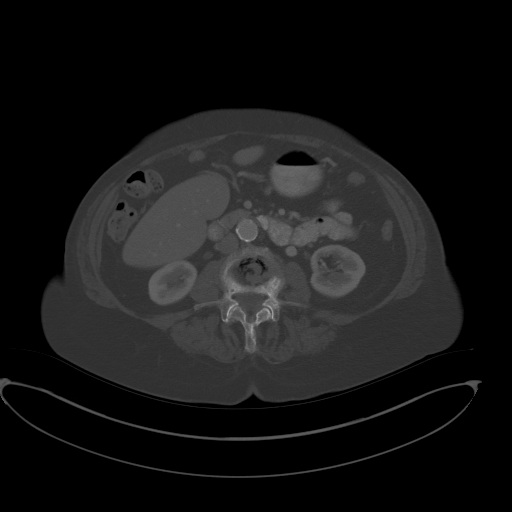
[im 63/86  soft-tissue]
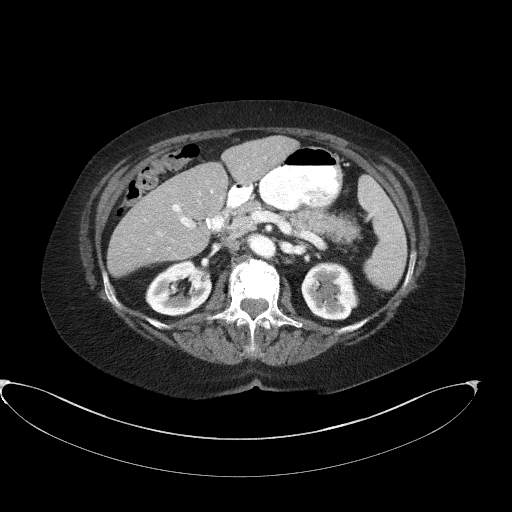
[im 68/86  soft-tissue]
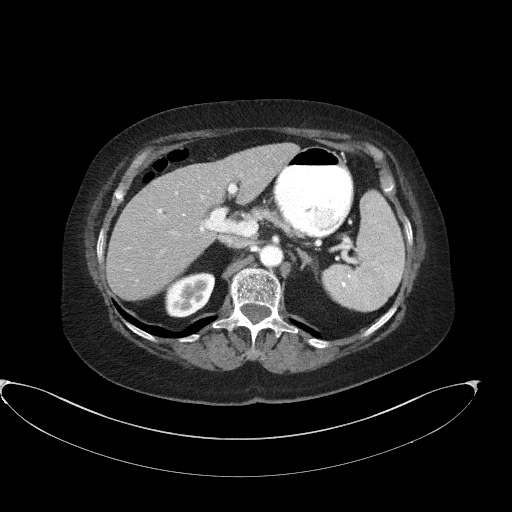
[im 72/86  soft-tissue]
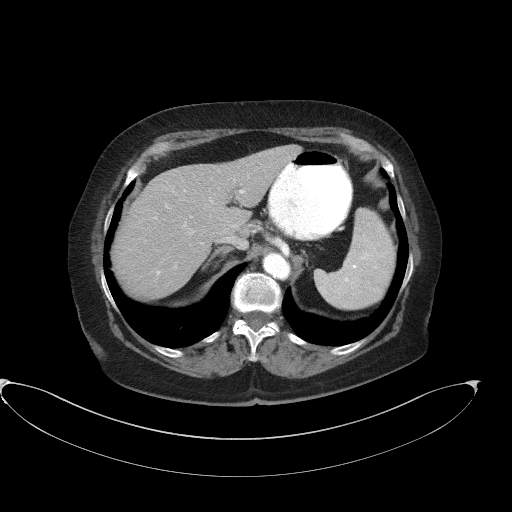
[im 81/86  soft-tissue]
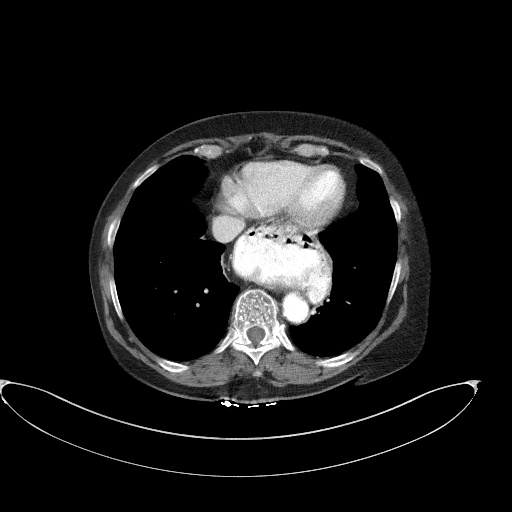

[Series 5: coronal st · coronal · 0.75mm/px · 3 of 97 slices shown]
[im 33/97  soft-tissue]
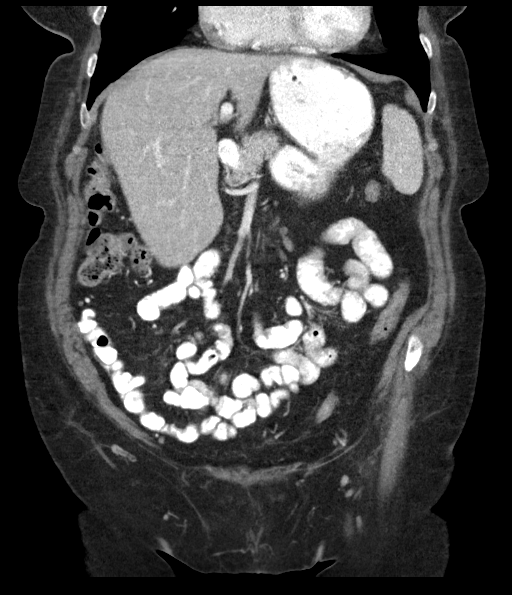
[im 43/97  soft-tissue]
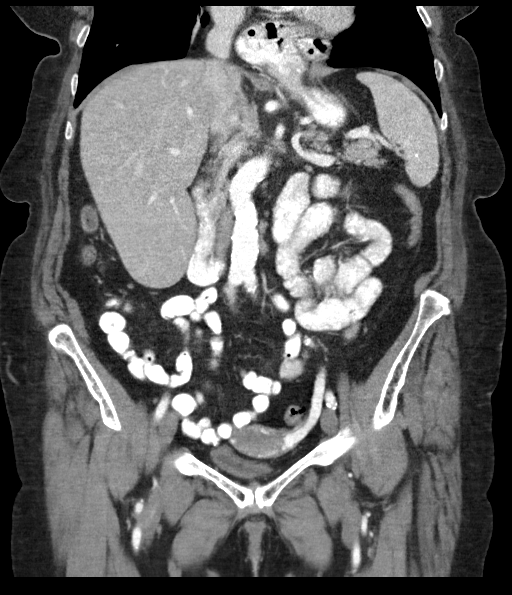
[im 54/97  soft-tissue]
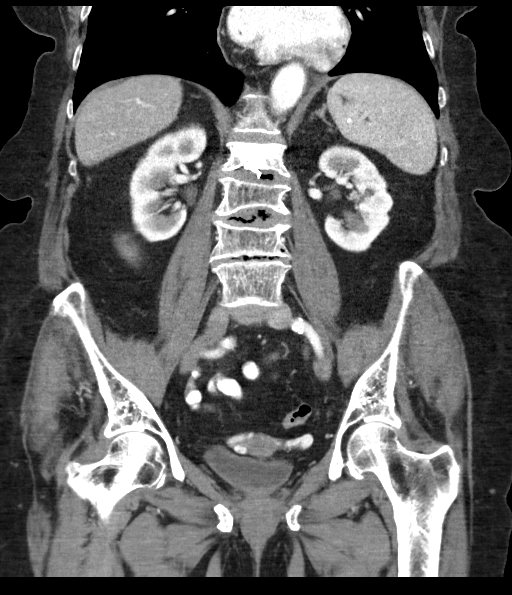

[16 of 46 positions shown; findings below may reference images not displayed]

FINDINGS: Lower chest:  No acute findings.

Hepatobiliary: Mild hepatic steatosis. Some small calcified
granulomata appears stable in the superior liver. The gallbladder
has been removed. No biliary ductal dilatation.

Pancreas: No mass, inflammatory changes, or other significant
abnormality.

Spleen: Stable calcified granulomata

Adrenals/Urinary Tract: Stable tiny bilateral cortical cysts. No
evidence of hydronephrosis, solid renal lesions or urinary tract
calculi. The bladder is decompressed. There likely is mild bladder
prolapse with pelvic floor relaxation.

Stomach/Bowel: Stable large hiatal hernia. No bowel obstruction,
free air, inflammation or abscess.

Vascular/Lymphatic: No pathologically enlarged lymph nodes. No
evidence of abdominal aortic aneurysm.

Reproductive: Postmenopausal uterus. Prominence of bilateral
periuterine pelvic veins. Focal densely calcified degenerated
fibroid in the uterus. No adnexal masses.

Other: No hernias identified.

Musculoskeletal: Stable methylmethacrylate in the L2 vertebral body.
Stable mild compression deformities of L3 and L4. Stable diffuse
lumbar spondylosis. No residua of prior pelvic or sacral fractures
identified by CT.
IMPRESSION: 1. No acute findings in the abdomen or pelvis.
2. Stable large hiatal hernia.
3. Mild hepatic steatosis.
4. Prominent bilateral periuterine pelvic veins which can be a sign
of incompetence of the ovarian veins.
5. Stable vertebral augmentation at L2 and mild compression
fractures at L3 and L4.

## 2018-05-05 ENCOUNTER — Other Ambulatory Visit: Payer: Self-pay | Admitting: Family

## 2018-05-09 ENCOUNTER — Inpatient Hospital Stay: Payer: Medicare Other

## 2018-05-09 ENCOUNTER — Inpatient Hospital Stay: Payer: Medicare Other | Attending: Hematology & Oncology | Admitting: Hematology & Oncology

## 2018-05-09 ENCOUNTER — Encounter: Payer: Self-pay | Admitting: Hematology & Oncology

## 2018-05-09 ENCOUNTER — Other Ambulatory Visit: Payer: Self-pay

## 2018-05-09 VITALS — BP 160/71 | HR 64 | Temp 97.9°F | Resp 20 | Wt 151.0 lb

## 2018-05-09 DIAGNOSIS — Z853 Personal history of malignant neoplasm of breast: Secondary | ICD-10-CM | POA: Diagnosis not present

## 2018-05-09 DIAGNOSIS — C50412 Malignant neoplasm of upper-outer quadrant of left female breast: Secondary | ICD-10-CM

## 2018-05-09 DIAGNOSIS — D45 Polycythemia vera: Secondary | ICD-10-CM | POA: Diagnosis not present

## 2018-05-09 DIAGNOSIS — Z7982 Long term (current) use of aspirin: Secondary | ICD-10-CM | POA: Diagnosis not present

## 2018-05-09 DIAGNOSIS — D751 Secondary polycythemia: Secondary | ICD-10-CM

## 2018-05-09 DIAGNOSIS — D500 Iron deficiency anemia secondary to blood loss (chronic): Secondary | ICD-10-CM

## 2018-05-09 DIAGNOSIS — Z170 Estrogen receptor positive status [ER+]: Secondary | ICD-10-CM

## 2018-05-09 DIAGNOSIS — Z79899 Other long term (current) drug therapy: Secondary | ICD-10-CM | POA: Insufficient documentation

## 2018-05-09 LAB — CBC WITH DIFFERENTIAL (CANCER CENTER ONLY)
Abs Immature Granulocytes: 0.02 10*3/uL (ref 0.00–0.07)
Basophils Absolute: 0.1 10*3/uL (ref 0.0–0.1)
Basophils Relative: 1 %
Eosinophils Absolute: 0.1 10*3/uL (ref 0.0–0.5)
Eosinophils Relative: 2 %
HCT: 45.8 % (ref 36.0–46.0)
Hemoglobin: 13.8 g/dL (ref 12.0–15.0)
Immature Granulocytes: 0 %
Lymphocytes Relative: 17 %
Lymphs Abs: 0.9 10*3/uL (ref 0.7–4.0)
MCH: 27.5 pg (ref 26.0–34.0)
MCHC: 30.1 g/dL (ref 30.0–36.0)
MCV: 91.4 fL (ref 80.0–100.0)
Monocytes Absolute: 0.5 10*3/uL (ref 0.1–1.0)
Monocytes Relative: 10 %
Neutro Abs: 3.5 10*3/uL (ref 1.7–7.7)
Neutrophils Relative %: 70 %
Platelet Count: 470 10*3/uL — ABNORMAL HIGH (ref 150–400)
RBC: 5.01 MIL/uL (ref 3.87–5.11)
RDW: 13.7 % (ref 11.5–15.5)
WBC Count: 5 10*3/uL (ref 4.0–10.5)
nRBC: 0 % (ref 0.0–0.2)

## 2018-05-09 LAB — LACTATE DEHYDROGENASE: LDH: 190 U/L (ref 98–192)

## 2018-05-09 LAB — CMP (CANCER CENTER ONLY)
ALT: 7 U/L (ref 0–44)
AST: 15 U/L (ref 15–41)
Albumin: 4.2 g/dL (ref 3.5–5.0)
Alkaline Phosphatase: 67 U/L (ref 38–126)
Anion gap: 6 (ref 5–15)
BUN: 8 mg/dL (ref 8–23)
CO2: 30 mmol/L (ref 22–32)
Calcium: 9.2 mg/dL (ref 8.9–10.3)
Chloride: 101 mmol/L (ref 98–111)
Creatinine: 0.77 mg/dL (ref 0.44–1.00)
GFR, Est AFR Am: >60 mL/min (ref >60)
GFR, Est Non Af Am: >60 mL/min (ref >60)
Glucose, Bld: 110 mg/dL — ABNORMAL HIGH (ref 70–99)
Potassium: 3.9 mmol/L (ref 3.5–5.1)
Sodium: 137 mmol/L (ref 135–145)
Total Bilirubin: 2 mg/dL — ABNORMAL HIGH (ref 0.3–1.2)
Total Protein: 7 g/dL (ref 6.5–8.1)

## 2018-05-09 NOTE — Patient Instructions (Signed)
Therapeutic Phlebotomy Therapeutic phlebotomy is the planned removal of blood from a person's body for the purpose of treating a medical condition. The procedure is similar to donating blood. Usually, about a pint (470 mL, or 0.47 L) of blood is removed. The average adult has 9-12 pints (4.3-5.7 L) of blood in the body. Therapeutic phlebotomy may be used to treat the following medical conditions:  Hemochromatosis. This is a condition in which the blood contains too much iron.  Polycythemia vera. This is a condition in which the blood contains too many red blood cells.  Porphyria cutanea tarda. This is a disease in which an important part of hemoglobin is not made properly. It results in the buildup of abnormal amounts of porphyrins in the body.  Sickle cell disease. This is a condition in which the red blood cells form an abnormal crescent shape rather than a round shape. Tell a health care provider about:  Any allergies you have.  All medicines you are taking, including vitamins, herbs, eye drops, creams, and over-the-counter medicines.  Any problems you or family members have had with anesthetic medicines.  Any blood disorders you have.  Any surgeries you have had.  Any medical conditions you have.  Whether you are pregnant or may be pregnant. What are the risks? Generally, this is a safe procedure. However, problems may occur, including:  Nausea or light-headedness.  Low blood pressure (hypotension).  Soreness, bleeding, swelling, or bruising at the needle insertion site.  Infection. What happens before the procedure?  Follow instructions from your health care provider about eating or drinking restrictions.  Ask your health care provider about: ? Changing or stopping your regular medicines. This is especially important if you are taking diabetes medicines or blood thinners (anticoagulants). ? Taking medicines such as aspirin and ibuprofen. These medicines can thin your  blood. Do not take these medicines unless your health care provider tells you to take them. ? Taking over-the-counter medicines, vitamins, herbs, and supplements.  Wear clothing with sleeves that can be raised above the elbow.  Plan to have someone take you home from the hospital or clinic.  You may have a blood sample taken.  Your blood pressure, pulse rate, and breathing rate will be measured. What happens during the procedure?   To lower your risk of infection: ? Your health care team will wash or sanitize their hands. ? Your skin will be cleaned with an antiseptic.  You may be given a medicine to numb the area (local anesthetic).  A tourniquet will be placed on your arm.  A needle will be inserted into one of your veins.  Tubing and a collection bag will be attached to that needle.  Blood will flow through the needle and tubing into the collection bag.  The collection bag will be placed lower than your arm to allow gravity to help the flow of blood into the bag.  You may be asked to open and close your hand slowly and continually during the entire collection.  After the specified amount of blood has been removed from your body, the collection bag and tubing will be clamped.  The needle will be removed from your vein.  Pressure will be held on the site of the needle insertion to stop the bleeding.  A bandage (dressing) will be placed over the needle insertion site. The procedure may vary among health care providers and hospitals. What happens after the procedure?  Your blood pressure, pulse rate, and breathing rate will be   measured after the procedure.  You will be encouraged to drink fluids.  Your recovery will be assessed and monitored.  You can return to your normal activities as told by your health care provider. Summary  Therapeutic phlebotomy is the planned removal of blood from a person's body for the purpose of treating a medical condition.  Therapeutic  phlebotomy may be used to treat hemochromatosis, polycythemia vera, porphyria cutanea tarda, or sickle cell disease.  In the procedure, a needle is inserted and about a pint (470 mL, or 0.47 L) of blood is removed. The average adult has 9-12 pints (4.3-5.7 L) of blood in the body.  This is generally a safe procedure, but it can sometimes cause problems such as nausea, light-headedness, or low blood pressure (hypotension). This information is not intended to replace advice given to you by your health care provider. Make sure you discuss any questions you have with your health care provider. Document Released: 10/02/2010 Document Revised: 05/16/2017 Document Reviewed: 05/16/2017 Elsevier Interactive Patient Education  2019 [employer]  

## 2018-05-09 NOTE — Progress Notes (Signed)
[name] presents today for phlebotomy per MD orders. Phlebotomy procedure started at 1345 and ended at 1355. 520 grams removed via 20 gauge angiocath.  Patient tolerated well Patient observed for 30 minutes after procedure without any incident. Patient tolerated procedure well. IV needle removed intact.

## 2018-05-09 NOTE — Progress Notes (Signed)
Hematology and Oncology Follow Up Visit  [name] [id number] October 16, 1929 84 y.o. 05/09/2018   Principle Diagnosis:  Polycythemia vera - JAK2 positive Stage I (T1bN0M0 ) infiltrating duct carcinoma the left breast   Past Therapy: Femara 2.5 mg by mouth daily - stopped 08/2015  Current Therapy:   Phlebotomy to maintain hematocrit below 45% Hydrea 500 mg by mouth every other day    Interim History:  [name] is here today for follow-up.  She actually is feeling little bit better.  The last time she was here we phlebotomize her.  Afterwards, she had a little bit of extra bleeding.  Not sure how that happened outside the fact that she is on baby aspirin.  She has had no other issues.  She had a very nice Christmas.  A grandson is over in [place].  He has been deployed over there.  She is able to talk with him on Skype which made her quite happy on Christmas Day.  She has had no problems with fever.  She has had no mouth sores.  She has had some slight salivary gland swelling under her right jaw.  She has had no issues with nausea or vomiting.  She is had no change in bowel or bladder habits.  Overall, her performance status is ECOG 1.    I am absolutely amazed that she is 84 years old going on 68 in 3 months.    Medications:  Allergies as of 05/09/2018      Reactions   Penicillins Hives, Rash, Other (See Comments)   Has patient had a PCN reaction causing immediate rash, facial/tongue/throat swelling, SOB or lightheadedness with hypotension: Yes Has patient had a PCN reaction causing severe rash involving mucus membranes or skin necrosis: yes Has patient had a PCN reaction that required hospitalization: No Has patient had a PCN reaction occurring within the last 10 years: no If all of the above answers are "NO", then may proceed with Cephalosporin use. Other Reaction: OTHER REACTION   Atorvastatin Diarrhea   REACTION: diarrhe   Ezetimibe    REACTION: nausea   Fluvastatin Sodium Other (See Comments)   "Can't remember"   Magnesium Hydroxide Other (See Comments)   REACTION: triggers HAs   Meloxicam Other (See Comments)   Pt seeing auro's / spots.     Omeprazole Magnesium    auras   Pneumovax [pneumococcal Polysaccharide Vaccine]    Local reaction   Quinapril Hcl Other (See Comments)   03-29-11 "feelings of tiredness"   Simvastatin Diarrhea   Topamax [topiramate] Itching   Valsartan Itching, Other (See Comments)   REACTION: toes itch   Vit D-vit E-safflower Oil Other (See Comments)   Headaches   Carvedilol    "teeth hurt when I took it" "teeth hurt when I took it"      Medication List       Accurate as of May 09, 2018 12:55 PM. Always use your most recent med list.        acetaminophen-codeine 300-30 MG tablet Commonly known as:  TYLENOL #3 Take 1 tablet by mouth every 8 (eight) hours as needed. For migraine   aspirin 81 MG tablet Take 81 mg by mouth daily.   co-enzyme Q-10 30 MG capsule Take 30 mg by mouth daily.   dicyclomine 10 MG capsule Commonly known as:  BENTYL Take 1 capsule (10 mg total) by mouth 4 (four) times daily -  before meals and at bedtime.   FOLIC ACID PO  Take by mouth.   hydrochlorothiazide 25 MG tablet Commonly known as:  HYDRODIURIL Take 1 tablet (25 mg total) by mouth daily as needed (swelling in the legs ).   hydroxyurea 500 MG capsule Commonly known as:  HYDREA TAKE 1 CAPSULE BY MOUTH EVERY OTHER DAY TAKE WITH FOOD TO MINIMIZE SIDE EFFECT   LORazepam 0.5 MG tablet Commonly known as:  ATIVAN TAKE 1/2 TABLET BY MOUTH EVERY MORNING AND EVERY AFTERNOON. TAKE 2 TABLETS BY MOUTH AT BEDTIME AS NEEDED FOR ANXIETY   metoprolol tartrate 50 MG tablet Commonly known as:  LOPRESSOR Take 1 tablet (50 mg total) by mouth 2 (two) times daily.   mometasone 0.1 % cream Commonly known as:  ELOCON Apply 1 application topically daily.   omeprazole 20 MG capsule Commonly known as:  PRILOSEC Take 20 mg  by mouth daily.       Allergies:  Allergies  Allergen Reactions  . Penicillins Hives, Rash and Other (See Comments)    Has patient had a PCN reaction causing immediate rash, facial/tongue/throat swelling, SOB or lightheadedness with hypotension: Yes Has patient had a PCN reaction causing severe rash involving mucus membranes or skin necrosis: yes Has patient had a PCN reaction that required hospitalization: No Has patient had a PCN reaction occurring within the last 10 years: no If all of the above answers are "NO", then may proceed with Cephalosporin use.  Other Reaction: OTHER REACTION  . Atorvastatin Diarrhea    REACTION: diarrhe  . Ezetimibe     REACTION: nausea  . Fluvastatin Sodium Other (See Comments)    "Can't remember"  . Magnesium Hydroxide Other (See Comments)    REACTION: triggers HAs  . Meloxicam Other (See Comments)    Pt seeing auro's / spots.    . Omeprazole Magnesium     auras  . Pneumovax [Pneumococcal Polysaccharide Vaccine]     Local reaction  . Quinapril Hcl Other (See Comments)    03-29-11 "feelings of tiredness"  . Simvastatin Diarrhea  . Topamax [Topiramate] Itching  . Valsartan Itching and Other (See Comments)    REACTION: toes itch  . Vit D-Vit E-Safflower Oil Other (See Comments)    Headaches  . Carvedilol     "teeth hurt when I took it" "teeth hurt when I took it"    Past Medical History, Surgical history, Social history, and Family History were reviewed and updated.  Review of Systems: Review of Systems  Constitutional: Negative.   HENT: Negative.   Eyes: Negative.   Respiratory: Negative.   Cardiovascular: Negative.   Gastrointestinal: Negative.   Genitourinary: Negative.   Musculoskeletal: Negative.   Skin: Negative.   Neurological: Negative.   Endo/Heme/Allergies: Negative.   Psychiatric/Behavioral: Negative.      Physical Exam:  weight is 151 lb (68.5 kg). Her oral temperature is 97.9 F (36.6 C). Her blood pressure is  160/71 (abnormal) and her pulse is 64. Her respiration is 20 and oxygen saturation is 99%.   Wt Readings from Last 3 Encounters:  05/09/18 151 lb (68.5 kg)  03/13/18 151 lb (68.5 kg)  02/14/18 150 lb 8 oz (68.3 kg)    Physical Exam Vitals signs reviewed.  HENT:     Head: Normocephalic and atraumatic.  Eyes:     Pupils: Pupils are equal, round, and reactive to light.  Neck:     Musculoskeletal: Normal range of motion.  Cardiovascular:     Rate and Rhythm: Normal rate and regular rhythm.     Heart sounds:  Normal heart sounds.  Pulmonary:     Effort: Pulmonary effort is normal.     Breath sounds: Normal breath sounds.  Abdominal:     General: Bowel sounds are normal.     Palpations: Abdomen is soft.  Musculoskeletal: Normal range of motion.        General: No tenderness or deformity.  Lymphadenopathy:     Cervical: No cervical adenopathy.  Skin:    General: Skin is warm and dry.     Findings: No erythema or rash.  Neurological:     Mental Status: She is alert and oriented to person, place, and time.  Psychiatric:        Behavior: Behavior normal.        Thought Content: Thought content normal.        Judgment: Judgment normal.      Lab Results  Component Value Date   WBC 5.0 05/09/2018   HGB 13.8 05/09/2018   HCT 45.8 05/09/2018   MCV 91.4 05/09/2018   PLT 470 (H) 05/09/2018   Lab Results  Component Value Date   FERRITIN 25 03/13/2018   IRON 45 03/13/2018   TIBC 400 03/13/2018   UIBC 356 03/13/2018   IRONPCTSAT 11 (L) 03/13/2018   Lab Results  Component Value Date   RETICCTPCT 0.8 02/02/2015   RBC 5.01 05/09/2018   RETICCTABS 41.4 02/02/2015   No results found for: KPAFRELGTCHN, LAMBDASER, KAPLAMBRATIO No results found for: IGGSERUM, IGA, IGMSERUM No results found for: Odetta Pink, SPEI   Chemistry      Component Value Date/Time   NA 137 05/09/2018 1201   NA 142 03/25/2017 1457   NA 139  04/10/2016 0855   K 3.9 05/09/2018 1201   K 4.1 03/25/2017 1457   K 4.2 04/10/2016 0855   CL 101 05/09/2018 1201   CL 96 (L) 03/25/2017 1457   CO2 30 05/09/2018 1201   CO2 32 03/25/2017 1457   CO2 29 04/10/2016 0855   BUN 8 05/09/2018 1201   BUN 7 03/25/2017 1457   BUN 10.0 04/10/2016 0855   CREATININE 0.77 05/09/2018 1201   CREATININE 1.0 03/25/2017 1457   CREATININE 0.8 04/10/2016 0855      Component Value Date/Time   CALCIUM 9.2 05/09/2018 1201   CALCIUM 9.8 03/25/2017 1457   CALCIUM 9.5 04/10/2016 0855   ALKPHOS 67 05/09/2018 1201   ALKPHOS 86 (H) 03/25/2017 1457   ALKPHOS 85 04/10/2016 0855   AST 15 05/09/2018 1201   AST 19 04/10/2016 0855   ALT 7 05/09/2018 1201   ALT 15 03/25/2017 1457   ALT 13 04/10/2016 0855   BILITOT 2.0 (H) 05/09/2018 1201   BILITOT 1.26 (H) 04/10/2016 0855       Impression and Plan: Ms. Abundis is a very pleasant 84 yo caucasian female with polycythemia and history of stage I infiltrating ductal carcinoma of the left breast with lumpectomy in 2013.   She completed treatment with Femara in April 2017.   I will phlebotomize her again today.  I think this would be worthwhile doing.  I know that she is close to 45% with her hematocrit.  I would like to see her back in 8 weeks.  I think in 8 weeks, that would be reasonable given that she is being phlebotomized today.    Volanda Napoleon, MD 12/27/201912:55 PM

## 2018-05-12 ENCOUNTER — Telehealth: Payer: Self-pay | Admitting: Cardiology

## 2018-05-12 LAB — IRON AND TIBC
Iron: 104 ug/dL (ref 41–142)
Saturation Ratios: 25 % (ref 21–57)
TIBC: 413 ug/dL (ref 236–444)
UIBC: 308 ug/dL (ref 120–384)

## 2018-05-12 LAB — FERRITIN: Ferritin: 28 ng/mL (ref 11–307)

## 2018-05-12 NOTE — Telephone Encounter (Signed)
Attempted to call pt, unable to leave VM.

## 2018-05-13 NOTE — Telephone Encounter (Signed)
Attempted to contact patient with no answer. Unable to leave a voicemail. Patient has been scheduled to see Dr. Bettina Gavia on Friday, 05/16/2018 at 2:00 in the [facility] office.

## 2018-05-15 NOTE — Progress Notes (Signed)
Cardiology Office Note:    Date:  05/16/2018   ID:  MALKIA NIPPERT, DOB January 17, 1930, MRN [id number]  PCP:  Colon Branch, MD  Cardiologist:  Shirlee More, MD    Referring MD: Colon Branch, MD     ASSESSMENT:    1. SOB (shortness of breath)   2. Mild CAD   3. Essential hypertension   4. Hyperlipidemia, unspecified hyperlipidemia type   5. Polycythemia    PLAN:    In order of problems listed above:  1. Further evaluation proBNP level echocardiogram regarding heart failure and sleep study to follow-up on her complaints and history of documented sleep apnea with treatment.  If abnormal would benefit from CPAP 2. Stable and continue her current medical treatment 3. Stable continue current treatment including beta-blocker 4. Poorly controlled not on lipid-lowering therapy 5. Managed by oncology   Next appointment: 3 months after the above testing   Medication Adjustments/Labs and Tests Ordered: Current medicines are reviewed at length with the patient today.  Concerns regarding medicines are outlined above.  No orders of the defined types were placed in this encounter.  No orders of the defined types were placed in this encounter.   Chief Complaint  Patient presents with  . Coronary Artery Disease  . Shortness of Breath    History of Present Illness:    [name] is a 85 y.o. female with a hx of mild coronary artery disease hypertension and hyperlipidemia last seen 05/24/2017.Marland Kitchen Compliance with diet, lifestyle and medications: Yes  She does not feel well she complains of weakness and shortness of breath but no edema orthopnea last week she had phlebotomy for polycythemia vera.  She relates decades ago she had a sleep study abnormal was advised CPAP and never followed through.  She wonders if that is the reason she feels badly she has had a couple of isolated episodes of nonexertional chest pain but did not take nitroglycerin.  Recent labs reviewed CMP CBC are good.   Lipid-lowering therapy. Past Medical History:  Diagnosis Date  . CAD (coronary artery disease)    mild nonobstructive disease on cath in 2003  . Cancer ([place])   . Cardiomyopathy    Probable Takotsubo, severe CP w/ normal cath in 1994. Severe CP in 2003 w/ widespread T wave inversions on ECG. Cath w/ minimal coronary disease but LV-gram showed periapical severe hypokinesis and basilar hyperkinesia (EF 40%). Last echo in 4/09 confirmed full LV functional recovery with EF 60%, no regional wall motion abnormalities, mild to moderate LVH.  Marland Kitchen CVA (cerebral infarction)   . Depression with anxiety 03-29-11   lost husband 3'09  . Diverticulitis   . DVT (deep venous thrombosis) ([place])    after venous ablation, R leg  . E. coli gastroenteritis 03-29-11   8'10  . Fracture 09/10/07   L2, status post vertebroplasty of L2 performed by IR  . GERD (gastroesophageal reflux disease)   . Headache(784.0)    migraines  . Hemorrhoids   . Hiatal hernia 03-29-11   no nerve problems  . Hyperlipidemia   . Hyperplastic colon polyp 06/2001  . Hypertension   . Iron deficiency anemia due to chronic blood loss 12/30/2017  . OA (osteoarthritis) of knee 03-29-11   w/ bilateral knee pain-not a problem now  . OSA (obstructive sleep apnea) 03-29-11   no cpap used, not a problem now.  . Osteoporosis   . Otalgia of both ears    Dr. Simeon Craft  . Polycythemia   .  Stroke [facility]) 03-29-11   CVA x2 -last 10'12-?TIA(visual problems)  . Varicose vein     Past Surgical History:  Procedure Laterality Date  . APPENDECTOMY    . BACK SURGERY    . BREAST BIOPSY    . BREAST CYST EXCISION    . BREAST LUMPECTOMY Left 2013   left stage I left breast cancer  . CHOLECYSTECTOMY  04/09/2011   Procedure: LAPAROSCOPIC CHOLECYSTECTOMY WITH INTRAOPERATIVE CHOLANGIOGRAM;  Surgeon: Odis Hollingshead, MD;  Location: [facility] ORS;  Service: General;  Laterality: N/A;  . Dental Extraction     L maxillary molar  . KYPHOSIS SURGERY  08/2007   cement  used  . OVARIAN CYST SURGERY     left  . TONSILLECTOMY    . TOTAL KNEE ARTHROPLASTY  06/2010   left  . TUBAL LIGATION      Current Medications: Current Meds  Medication Sig  . acetaminophen-codeine (TYLENOL #3) 300-30 MG tablet Take 1 tablet by mouth every 8 (eight) hours as needed. For migraine  . aspirin 81 MG tablet Take 81 mg by mouth daily.  Marland Kitchen co-enzyme Q-10 30 MG capsule Take 30 mg by mouth daily.  Marland Kitchen dicyclomine (BENTYL) 10 MG capsule Take 1 capsule (10 mg total) by mouth 4 (four) times daily -  before meals and at bedtime.  Marland Kitchen FOLIC ACID PO Take by mouth.  . hydrochlorothiazide (HYDRODIURIL) 25 MG tablet Take 1 tablet (25 mg total) by mouth daily as needed (swelling in the legs ).  . hydroxyurea (HYDREA) 500 MG capsule TAKE 1 CAPSULE BY MOUTH EVERY OTHER DAY TAKE WITH FOOD TO MINIMIZE SIDE EFFECT  . LORazepam (ATIVAN) 0.5 MG tablet TAKE 1/2 TABLET BY MOUTH EVERY MORNING AND EVERY AFTERNOON. TAKE 2 TABLETS BY MOUTH AT BEDTIME AS NEEDED FOR ANXIETY  . metoprolol tartrate (LOPRESSOR) 50 MG tablet Take 1 tablet (50 mg total) by mouth 2 (two) times daily.  . mometasone (ELOCON) 0.1 % cream Apply 1 application topically daily.  Marland Kitchen omeprazole (PRILOSEC) 20 MG capsule Take 20 mg by mouth daily.     Allergies:   Penicillins; Atorvastatin; Ezetimibe; Fluvastatin sodium; Magnesium hydroxide; Meloxicam; Omeprazole magnesium; Pneumovax [pneumococcal polysaccharide vaccine]; Quinapril hcl; Simvastatin; Topamax [topiramate]; Valsartan; Vit d-vit e-safflower oil; and Carvedilol   Social History   Socioeconomic History  . Marital status: Widowed    Spouse name: Not on file  . Number of children: 3  . Years of education: Not on file  . Highest education level: Not on file  Occupational History  . Occupation: n/a  Social Needs  . Financial resource strain: Not on file  . Food insecurity:    Worry: Not on file    Inability: Not on file  . Transportation needs:    Medical: Not on file     Non-medical: Not on file  Tobacco Use  . Smoking status: Former Smoker    Packs/day: 1.00    Years: 37.00    Pack years: 37.00    Types: Cigarettes    Start date: 06/26/1951    Last attempt to quit: 05/14/1988    Years since quitting: 30.0  . Smokeless tobacco: Never Used  . Tobacco comment: quit 25 years ago  Substance and Sexual Activity  . Alcohol use: Yes    Alcohol/week: 1.0 standard drinks    Types: 1 Cans of beer per week    Comment: occasional/social  . Drug use: No  . Sexual activity: Not Currently  Lifestyle  . Physical activity:  Days per week: Not on file    Minutes per session: Not on file  . Stress: Not on file  Relationships  . Social connections:    Talks on phone: Not on file    Gets together: Not on file    Attends religious service: Not on file    Active member of club or organization: Not on file    Attends meetings of clubs or organizations: Not on file    Relationship status: Not on file  Other Topics Concern  . Not on file  Social History Narrative   Lost husband 07/29/07-    Lives in [place] in a town house by herself, has 3 daughters, lost Mickel Baas (03-2017)   Early Chars lives in [place], [place] ( [place])     Still drives               Family History: The patient's family history includes Breast cancer in her mother and sister; Colon cancer in an other family member; Heart disease in her father; Leukemia in her brother. ROS:   Please see the history of present illness.    All other systems reviewed and are negative.  EKGs/Labs/Other Studies Reviewed:    The following studies were reviewed today:  EKG:  EKG ordered today.  The ekg ordered today demonstrates [facility] and normal  Recent Labs: 09/02/2017: TSH 1.74 05/09/2018: ALT 7; BUN 8; Creatinine 0.77; Hemoglobin 13.8; Platelet Count 470; Potassium 3.9; Sodium 137  Recent Lipid Panel    Component Value Date/Time   CHOL 212 (H) 03/19/2014 1141   TRIG 110.0 03/19/2014 1141   HDL 47.20  03/19/2014 1141   CHOLHDL 4 03/19/2014 1141   VLDL 22.0 03/19/2014 1141   LDLCALC 143 (H) 03/19/2014 1141   LDLDIRECT 142.3 06/25/2011 0954    Physical Exam:    VS:  BP (!) 154/80 (BP Location: Right Arm, Patient Position: Sitting, Cuff Size: Normal)   Pulse 65   Wt 153 lb (69.4 kg)   BMI 24.69 kg/m     Wt Readings from Last 3 Encounters:  05/16/18 153 lb (69.4 kg)  05/09/18 151 lb (68.5 kg)  03/13/18 151 lb (68.5 kg)     GEN:  Well nourished, well developed in no acute distress HEENT: Normal NECK: No JVD; No carotid bruits LYMPHATICS: No lymphadenopathy CARDIAC: RRR, no murmurs, rubs, gallops RESPIRATORY:  Clear to auscultation without rales, wheezing or rhonchi  ABDOMEN: Soft, non-tender, non-distended MUSCULOSKELETAL:  No edema; No deformity  SKIN: Warm and dry NEUROLOGIC:  Alert and oriented x 3 PSYCHIATRIC:  Normal affect    Signed, Shirlee More, MD  05/16/2018 2:36 PM    [facility]

## 2018-05-15 NOTE — Telephone Encounter (Signed)
Multiple attempts have been made to reach the patient.  She is scheduled to be seen tomorrow 05-16-2018.

## 2018-05-16 ENCOUNTER — Encounter: Payer: Self-pay | Admitting: Cardiology

## 2018-05-16 ENCOUNTER — Ambulatory Visit (INDEPENDENT_AMBULATORY_CARE_PROVIDER_SITE_OTHER): Payer: Medicare Other | Admitting: Cardiology

## 2018-05-16 VITALS — BP 154/80 | HR 65 | Ht 66.0 in | Wt 153.0 lb

## 2018-05-16 DIAGNOSIS — Z9189 Other specified personal risk factors, not elsewhere classified: Secondary | ICD-10-CM | POA: Diagnosis not present

## 2018-05-16 DIAGNOSIS — I2510 Atherosclerotic heart disease of native coronary artery without angina pectoris: Secondary | ICD-10-CM | POA: Diagnosis not present

## 2018-05-16 DIAGNOSIS — E785 Hyperlipidemia, unspecified: Secondary | ICD-10-CM | POA: Diagnosis not present

## 2018-05-16 DIAGNOSIS — R0602 Shortness of breath: Secondary | ICD-10-CM

## 2018-05-16 DIAGNOSIS — I10 Essential (primary) hypertension: Secondary | ICD-10-CM

## 2018-05-16 DIAGNOSIS — D751 Secondary polycythemia: Secondary | ICD-10-CM | POA: Diagnosis not present

## 2018-05-16 NOTE — Patient Instructions (Signed)
Medication Instructions:  Your physician recommends that you continue on your current medications as directed. Please refer to the Current Medication list given to you today.  If you need a refill on your cardiac medications before your next appointment, please call your pharmacy.   Lab work: Your physician recommends that you return for lab work today: Pro BNP.  If you have labs (blood work) drawn today and your tests are completely normal, you will receive your results only by: Marland Kitchen MyChart Message (if you have MyChart) OR . A paper copy in the mail If you have any lab test that is abnormal or we need to change your treatment, we will call you to review the results.  Testing/Procedures: You had an EKG today.  Your physician has requested that you have an echocardiogram. Echocardiography is a painless test that uses sound waves to create images of your heart. It provides your doctor with information about the size and shape of your heart and how well your heart's chambers and valves are working. This procedure takes approximately one hour. There are no restrictions for this procedure.  Your physician has recommended that you have a sleep study. This test records several body functions during sleep, including: brain activity, eye movement, oxygen and carbon dioxide blood levels, heart rate and rhythm, breathing rate and rhythm, the flow of air through your mouth and nose, snoring, body muscle movements, and chest and belly movement.  Follow-Up: At [facility], you and your health needs are our priority.  As part of our continuing mission to provide you with exceptional heart care, we have created designated Provider Care Teams.  These Care Teams include your primary Cardiologist (physician) and Advanced Practice Providers (APPs -  Physician Assistants and Nurse Practitioners) who all work together to provide you with the care you need, when you need it. You will need a follow up appointment in  3 months.  Please call our office 2 months in advance to schedule this appointment.    Echocardiogram An echocardiogram is a procedure that uses painless sound waves (ultrasound) to produce an image of the heart. Images from an echocardiogram can provide important information about:  Signs of coronary artery disease (CAD).  Aneurysm detection. An aneurysm is a weak or damaged part of an artery wall that bulges out from the normal force of blood pumping through the body.  Heart size and shape. Changes in the size or shape of the heart can be associated with certain conditions, including heart failure, aneurysm, and CAD.  Heart muscle function.  Heart valve function.  Signs of a past heart attack.  Fluid buildup around the heart.  Thickening of the heart muscle.  A tumor or infectious growth around the heart valves. Tell a health care provider about:  Any allergies you have.  All medicines you are taking, including vitamins, herbs, eye drops, creams, and over-the-counter medicines.  Any blood disorders you have.  Any surgeries you have had.  Any medical conditions you have.  Whether you are pregnant or may be pregnant. What are the risks? Generally, this is a safe procedure. However, problems may occur, including:  Allergic reaction to dye (contrast) that may be used during the procedure. What happens before the procedure? No specific preparation is needed. You may eat and drink normally. What happens during the procedure?   An IV tube may be inserted into one of your veins.  You may receive contrast through this tube. A contrast is an injection that improves  the quality of the pictures from your heart.  A gel will be applied to your chest.  A wand-like tool (transducer) will be moved over your chest. The gel will help to transmit the sound waves from the transducer.  The sound waves will harmlessly bounce off of your heart to allow the heart images to be captured  in real-time motion. The images will be recorded on a computer. The procedure may vary among health care providers and hospitals. What happens after the procedure?  You may return to your normal, everyday life, including diet, activities, and medicines, unless your health care provider tells you not to do that. Summary  An echocardiogram is a procedure that uses painless sound waves (ultrasound) to produce an image of the heart.  Images from an echocardiogram can provide important information about the size and shape of your heart, heart muscle function, heart valve function, and fluid buildup around your heart.  You do not need to do anything to prepare before this procedure. You may eat and drink normally.  After the echocardiogram is completed, you may return to your normal, everyday life, unless your health care provider tells you not to do that. This information is not intended to replace advice given to you by your health care provider. Make sure you discuss any questions you have with your health care provider. Document Released: 04/27/2000 Document Revised: 06/02/2016 Document Reviewed: 06/02/2016 Elsevier Interactive Patient Education  2019 [employer].

## 2018-05-17 LAB — PRO B NATRIURETIC PEPTIDE: NT-Pro BNP: 196 pg/mL (ref 0–738)

## 2018-05-19 ENCOUNTER — Encounter: Payer: Self-pay | Admitting: Internal Medicine

## 2018-05-19 ENCOUNTER — Ambulatory Visit (INDEPENDENT_AMBULATORY_CARE_PROVIDER_SITE_OTHER): Payer: Medicare Other | Admitting: Internal Medicine

## 2018-05-19 VITALS — BP 122/70 | HR 57 | Temp 97.7°F | Resp 16 | Ht 66.0 in | Wt 152.1 lb

## 2018-05-19 DIAGNOSIS — I2510 Atherosclerotic heart disease of native coronary artery without angina pectoris: Secondary | ICD-10-CM

## 2018-05-19 DIAGNOSIS — Z79899 Other long term (current) drug therapy: Secondary | ICD-10-CM | POA: Diagnosis not present

## 2018-05-19 DIAGNOSIS — I7300 Raynaud's syndrome without gangrene: Secondary | ICD-10-CM

## 2018-05-19 DIAGNOSIS — M150 Primary generalized (osteo)arthritis: Secondary | ICD-10-CM | POA: Diagnosis not present

## 2018-05-19 DIAGNOSIS — F341 Dysthymic disorder: Secondary | ICD-10-CM | POA: Diagnosis not present

## 2018-05-19 DIAGNOSIS — F5101 Primary insomnia: Secondary | ICD-10-CM | POA: Diagnosis not present

## 2018-05-19 DIAGNOSIS — M8949 Other hypertrophic osteoarthropathy, multiple sites: Secondary | ICD-10-CM

## 2018-05-19 DIAGNOSIS — M159 Polyosteoarthritis, unspecified: Secondary | ICD-10-CM

## 2018-05-19 MED ORDER — METOPROLOL TARTRATE 50 MG PO TABS: 50.0000 mg | ORAL_TABLET | Freq: Two times a day (BID) | ORAL | 3 refills | Status: DC

## 2018-05-19 NOTE — Patient Instructions (Addendum)
Please schedule Medicare Wellness with Glenard Haring.   GO TO THE LAB : Provide a urine sample  GO TO THE FRONT DESK Schedule your next appointment for a checkup in 4 to 5 months

## 2018-05-19 NOTE — Progress Notes (Signed)
Pre visit review using our clinic review tool, if applicable. No additional management support is needed unless otherwise documented below in the visit note. 

## 2018-05-19 NOTE — Assessment & Plan Note (Signed)
SOB: Saw cardiology 05/16/2018, she had shortness of breath, BNP was satisfactory. Rx a sleep study d/t fatigue OSA: See above, cardiology ordering a sleep study due to fatigue Polycythemia: Saw oncology 05/09/2018, due to history of polycythemia and breast cancer. She was phlebotomized. DJD: On Tylenol #3, UDS and contract today Anxiety, depression, insomnia: On Ativan, tolerates well, UDS and contract today.  Because she was fatigued/sleepy, she decreased dose of Ativan but she did not feel any different.   Fatigue and feeling sleepy: likely multifactorial including her history for sleep apnea. Raynaud phenomenon: new issue, as described above, recommend avoid cold temperatures or grabbing cold glasses, use gloves, etc.  Call if symptoms severe RTC 4 months

## 2018-05-19 NOTE — Progress Notes (Signed)
Subjective:    Patient ID: [name], female    DOB: [birth year], 85 y.o.   MRN: [id number]  DOS:  05/19/2018 Type of visit - description: Routine office visit Cardiology note reviewed Hematology note reviewed At the last visit, she complained of abdominal pain, taking PPIs, symptoms improved. Anxiety and DJD: On controlled substances.  Due for a UDS and contract   Review of Systems Recently complained of chest pain and difficulty breathing, reports that the chest pain is resolved, still has occasional short of breath.  Was seen by cardiology. Also, for the last several weeks her right hand and left index have gotten pale and purple.  Is her observation that happens whenever she is exposed to something cold.   Past Medical History:  Diagnosis Date  . CAD (coronary artery disease)    mild nonobstructive disease on cath in 2003  . Cancer ([place])   . Cardiomyopathy    Probable Takotsubo, severe CP w/ normal cath in 1994. Severe CP in 2003 w/ widespread T wave inversions on ECG. Cath w/ minimal coronary disease but LV-gram showed periapical severe hypokinesis and basilar hyperkinesia (EF 40%). Last echo in 4/09 confirmed full LV functional recovery with EF 60%, no regional wall motion abnormalities, mild to moderate LVH.  Marland Kitchen CVA (cerebral infarction)   . Depression with anxiety 03-29-11   lost husband 3'09  . Diverticulitis   . DVT (deep venous thrombosis) ([place])    after venous ablation, R leg  . E. coli gastroenteritis 03-29-11   8'10  . Fracture 09/10/07   L2, status post vertebroplasty of L2 performed by IR  . GERD (gastroesophageal reflux disease)   . Headache(784.0)    migraines  . Hemorrhoids   . Hiatal hernia 03-29-11   no nerve problems  . Hyperlipidemia   . Hyperplastic colon polyp 06/2001  . Hypertension   . Iron deficiency anemia due to chronic blood loss 12/30/2017  . OA (osteoarthritis) of knee 03-29-11   w/ bilateral knee pain-not a problem now  . OSA  (obstructive sleep apnea) 03-29-11   no cpap used, not a problem now.  . Osteoporosis   . Otalgia of both ears    Dr. Simeon Craft  . Polycythemia   . Stroke [facility]) 03-29-11   CVA x2 -last 10'12-?TIA(visual problems)  . Varicose vein     Past Surgical History:  Procedure Laterality Date  . APPENDECTOMY    . BACK SURGERY    . BREAST BIOPSY    . BREAST CYST EXCISION    . BREAST LUMPECTOMY Left 2013   left stage I left breast cancer  . CHOLECYSTECTOMY  04/09/2011   Procedure: LAPAROSCOPIC CHOLECYSTECTOMY WITH INTRAOPERATIVE CHOLANGIOGRAM;  Surgeon: Odis Hollingshead, MD;  Location: [facility];  Service: General;  Laterality: N/A;  . Dental Extraction     L maxillary molar  . KYPHOSIS SURGERY  08/2007   cement used  . OVARIAN CYST SURGERY     left  . TONSILLECTOMY    . TOTAL KNEE ARTHROPLASTY  06/2010   left  . TUBAL LIGATION      Social History   Socioeconomic History  . Marital status: Widowed    Spouse name: Not on file  . Number of children: 3  . Years of education: Not on file  . Highest education level: Not on file  Occupational History  . Occupation: n/a  Social Needs  . Financial resource strain: Not on file  . Food insecurity:    Worry:  Not on file    Inability: Not on file  . Transportation needs:    Medical: Not on file    Non-medical: Not on file  Tobacco Use  . Smoking status: Former Smoker    Packs/day: 1.00    Years: 37.00    Pack years: 37.00    Types: Cigarettes    Start date: 06/26/1951    Last attempt to quit: 05/14/1988    Years since quitting: 30.0  . Smokeless tobacco: Never Used  . Tobacco comment: quit 25 years ago  Substance and Sexual Activity  . Alcohol use: Yes    Alcohol/week: 1.0 standard drinks    Types: 1 Cans of beer per week    Comment: occasional/social  . Drug use: No  . Sexual activity: Not Currently  Lifestyle  . Physical activity:    Days per week: Not on file    Minutes per session: Not on file  . Stress: Not on file    Relationships  . Social connections:    Talks on phone: Not on file    Gets together: Not on file    Attends religious service: Not on file    Active member of club or organization: Not on file    Attends meetings of clubs or organizations: Not on file    Relationship status: Not on file  . Intimate partner violence:    Fear of current or ex partner: Not on file    Emotionally abused: Not on file    Physically abused: Not on file    Forced sexual activity: Not on file  Other Topics Concern  . Not on file  Social History Narrative   Lost husband 07/29/07-    Lives in [place] in a town house by herself, has 3 daughters, lost Mickel Baas [phone])   Early Chars lives in [place], [place] ( [place])     Still drives                Allergies as of 05/19/2018      Reactions   Penicillins Hives, Rash, Other (See Comments)   Has patient had a PCN reaction causing immediate rash, facial/tongue/throat swelling, SOB or lightheadedness with hypotension: Yes Has patient had a PCN reaction causing severe rash involving mucus membranes or skin necrosis: yes Has patient had a PCN reaction that required hospitalization: No Has patient had a PCN reaction occurring within the last 10 years: no If all of the above answers are "NO", then may proceed with Cephalosporin use. Other Reaction: OTHER REACTION   Atorvastatin Diarrhea   REACTION: diarrhe   Ezetimibe    REACTION: nausea   Fluvastatin Sodium Other (See Comments)   "Can't remember"   Magnesium Hydroxide Other (See Comments)   REACTION: triggers HAs   Meloxicam Other (See Comments)   Pt seeing auro's / spots.     Omeprazole Magnesium    auras   Pneumovax [pneumococcal Polysaccharide Vaccine]    Local reaction   Quinapril Hcl Other (See Comments)   03-29-11 "feelings of tiredness"   Simvastatin Diarrhea   Topamax [topiramate] Itching   Valsartan Itching, Other (See Comments)   REACTION: toes itch   Vit D-vit E-safflower Oil Other (See  Comments)   Headaches   Carvedilol    "teeth hurt when I took it" "teeth hurt when I took it"      Medication List       Accurate as of May 19, 2018  8:32 PM. Always use your  most recent med list.        acetaminophen-codeine 300-30 MG tablet Commonly known as:  TYLENOL #3 Take 1 tablet by mouth every 8 (eight) hours as needed. For migraine   aspirin 81 MG tablet Take 81 mg by mouth daily.   co-enzyme Q-10 30 MG capsule Take 30 mg by mouth daily.   dicyclomine 10 MG capsule Commonly known as:  BENTYL Take 1 capsule (10 mg total) by mouth 4 (four) times daily -  before meals and at bedtime.   FOLIC ACID PO Take by mouth.   hydrochlorothiazide 25 MG tablet Commonly known as:  HYDRODIURIL Take 1 tablet (25 mg total) by mouth daily as needed (swelling in the legs ).   hydroxyurea 500 MG capsule Commonly known as:  HYDREA TAKE 1 CAPSULE BY MOUTH EVERY OTHER DAY TAKE WITH FOOD TO MINIMIZE SIDE EFFECT   LORazepam 0.5 MG tablet Commonly known as:  ATIVAN TAKE 1/2 TABLET BY MOUTH EVERY MORNING AND EVERY AFTERNOON. TAKE 2 TABLETS BY MOUTH AT BEDTIME AS NEEDED FOR ANXIETY   metoprolol tartrate 50 MG tablet Commonly known as:  LOPRESSOR Take 1 tablet (50 mg total) by mouth 2 (two) times daily.   mometasone 0.1 % cream Commonly known as:  ELOCON Apply 1 application topically daily.   omeprazole 20 MG capsule Commonly known as:  PRILOSEC Take 20 mg by mouth daily.           Objective:   Physical Exam BP 122/70 (BP Location: Left Arm, Patient Position: Sitting, Cuff Size: Small)   Pulse (!) 57   Temp 97.7 F (36.5 C) (Oral)   Resp 16   Ht 5\' 6"  (1.676 m)   Wt 152 lb 2 oz (69 kg)   SpO2 96%   BMI 24.55 kg/m  General:   Well developed, NAD, BMI noted. HEENT:  Normocephalic . Face symmetric, atraumatic Lungs:  CTA B Normal respiratory effort, no intercostal retractions, no accessory muscle use. Heart: RRR,  no murmur.  No pretibial edema  bilaterally Upper extremities: Hands normal to inspection and palpation, good capillary refill, good radial pulses Skin: Not pale. Not jaundice Neurologic:  alert & oriented X3.  Speech normal, gait appropriate for age and unassisted Psych--  Cognition and judgment appear intact.  Cooperative with normal attention span and concentration.  Behavior appropriate. No anxious or depressed appearing.      Assessment     Assessment  HTN Dyslipidemia: Intolerant to most medicines including Zetia, Pravachol, Niaspan Depression, anxiety , insomnia - on ativan bid (03-2015 citalopram cause drowsiness, 04-2015 prozac diarrhea) Hem-Onc: POLYCYTEMIA-- sees hem-onc H/o breast ca  MSK: ---DJD :  Tylenol #3, uses rarely ---Osteoporosis:  T score -2.8 (02-2013): declined treatment 06-2017 ---L2 fractures, s/p vertebral plasty 2009 Migraine headaches - Tylenol #3 (rarely)  GI: GERD- intolerant to prevacid, visual disturbances HH, diverticulitis, Colon polyps  --h/o "functional " LLQ abd pain CV: ---Cardiomyopathy Probable Takotsubo cardiomyopathy: CP w/ normal cath in 1994.   Severe CP  2003 with widespread T wave inversions on ECG.  Cath with minimal coronary disease but LV-gram showed periapical severe hypokinesis and basilar hyperkinesia (EF 40%) >>> ECHO 4/09 confirmed full LV functional recovery with EF 60%, mild LVH. 2012: Nl EF per echo, (-) Lexiscan ---Stroke / TIA 2012 Sleep apnea 2012, , not on CPAP H/oDVT, right leg after venous ablation Raynaud phenomena, DX 05/2018  PLAN SOB: Saw cardiology 05/16/2018, she had shortness of breath, BNP was satisfactory. Rx a sleep study d/t  fatigue OSA: See above, cardiology ordering a sleep study due to fatigue Polycythemia: Saw oncology 05/09/2018, due to history of polycythemia and breast cancer. She was phlebotomized. DJD: On Tylenol #3, UDS and contract today Anxiety, depression, insomnia: On Ativan, tolerates well, UDS and contract today.   Because she was fatigued/sleepy, she decreased dose of Ativan but she did not feel any different.   Fatigue and feeling sleepy: likely multifactorial including her history for sleep apnea. Raynaud phenomenon: new issue, as described above, recommend avoid cold temperatures or grabbing cold glasses, use gloves, etc.  Call if symptoms severe RTC 4 months

## 2018-05-20 ENCOUNTER — Telehealth: Payer: Self-pay | Admitting: *Deleted

## 2018-05-20 NOTE — Telephone Encounter (Signed)
Staff message sent to [facility] patient has Medicare. No PA is required. Ok to schedule sleep study. Sleep lab information provided.

## 2018-05-20 NOTE — Telephone Encounter (Signed)
-----   Message from Horton Finer sent at 05/16/2018  2:54 PM EST ----- Regarding: Please precert Please precert patient to have a sleep study due to sleep apnea per Dr. Bettina Gavia.  Thanks!

## 2018-05-21 ENCOUNTER — Ambulatory Visit (HOSPITAL_BASED_OUTPATIENT_CLINIC_OR_DEPARTMENT_OTHER)
Admission: RE | Admit: 2018-05-21 | Discharge: 2018-05-21 | Disposition: A | Discharge status: Home / Self Care | Payer: Medicare Other | Source: Ambulatory Visit | Attending: Cardiology | Admitting: Cardiology

## 2018-05-21 DIAGNOSIS — R0602 Shortness of breath: Secondary | ICD-10-CM | POA: Diagnosis not present

## 2018-05-21 DIAGNOSIS — I10 Essential (primary) hypertension: Secondary | ICD-10-CM | POA: Diagnosis not present

## 2018-05-21 LAB — PAIN MGMT, PROFILE 8 W/CONF, U
6 Acetylmorphine: NEGATIVE ng/mL (ref <10)
Alcohol Metabolites: NEGATIVE ng/mL (ref <500)
Alphahydroxyalprazolam: NEGATIVE ng/mL (ref <25)
Alphahydroxymidazolam: NEGATIVE ng/mL (ref <50)
Alphahydroxytriazolam: NEGATIVE ng/mL (ref <50)
Aminoclonazepam: NEGATIVE ng/mL (ref <25)
Amphetamines: NEGATIVE ng/mL (ref <500)
Benzodiazepines: POSITIVE ng/mL — AB (ref <100)
Buprenorphine, Urine: NEGATIVE ng/mL (ref <5)
Cocaine Metabolite: NEGATIVE ng/mL (ref <150)
Creatinine: 65.4 mg/dL
Hydroxyethylflurazepam: NEGATIVE ng/mL (ref <50)
Lorazepam: 663 ng/mL — ABNORMAL HIGH (ref <50)
MDMA: NEGATIVE ng/mL (ref <500)
Marijuana Metabolite: NEGATIVE ng/mL (ref <20)
Nordiazepam: NEGATIVE ng/mL (ref <50)
Opiates: NEGATIVE ng/mL (ref <100)
Oxazepam: NEGATIVE ng/mL (ref <50)
Oxidant: NEGATIVE ug/mL (ref <200)
Oxycodone: NEGATIVE ng/mL (ref <100)
Temazepam: NEGATIVE ng/mL (ref <50)
pH: 6.59 (ref 4.5–9.0)

## 2018-05-21 NOTE — Progress Notes (Signed)
  Echocardiogram 2D Echocardiogram has been performed.  [name] [name] 05/21/2018, 9:54 AM

## 2018-05-22 ENCOUNTER — Telehealth: Payer: Self-pay | Admitting: *Deleted

## 2018-05-22 NOTE — Telephone Encounter (Signed)
Sleep study has been scheduled for Tuesday, 06/24/2018 at 8:00 pm. Patient informed and provided with location address and phone number. No further questions.

## 2018-06-19 ENCOUNTER — Ambulatory Visit: Payer: Medicare Other | Admitting: Internal Medicine

## 2018-06-23 ENCOUNTER — Ambulatory Visit (INDEPENDENT_AMBULATORY_CARE_PROVIDER_SITE_OTHER): Payer: Medicare Other | Admitting: Internal Medicine

## 2018-06-23 ENCOUNTER — Encounter: Payer: Self-pay | Admitting: Internal Medicine

## 2018-06-23 VITALS — BP 126/74 | HR 75 | Temp 97.5°F | Resp 16 | Ht 66.0 in | Wt 151.5 lb

## 2018-06-23 DIAGNOSIS — I2510 Atherosclerotic heart disease of native coronary artery without angina pectoris: Secondary | ICD-10-CM

## 2018-06-23 DIAGNOSIS — E559 Vitamin D deficiency, unspecified: Secondary | ICD-10-CM | POA: Diagnosis not present

## 2018-06-23 DIAGNOSIS — R5383 Other fatigue: Secondary | ICD-10-CM

## 2018-06-23 DIAGNOSIS — F341 Dysthymic disorder: Secondary | ICD-10-CM | POA: Diagnosis not present

## 2018-06-23 LAB — SEDIMENTATION RATE: Sed Rate: 2 mm/hr (ref 0–30)

## 2018-06-23 MED ORDER — DULOXETINE HCL 30 MG PO CPEP: 30.0000 mg | ORAL_CAPSULE | Freq: Every day | ORAL | 1 refills | Status: DC

## 2018-06-23 NOTE — Progress Notes (Signed)
Subjective:    Patient ID: [name], female    DOB: [birth year], 85 y.o.   MRN: [id number]  DOS:  06/23/2018 Type of visit - description: Acute She really has no new concerns : ongoing fatigue, subjective fever on and off, "just do not feel well". She has not proceed with the sleep study just yet. Chart is extensively reviewed   Review of Systems Currently with no headaches although she had sharp, left-sided headaches on and off but they stopped few months ago. No weight loss No visual disturbances She has aches and pains but nothing unusual.  No swelling of the hands or wrists. When asked about depression she admits to being depressed on and off.  No suicidal ideas.  Past Medical History:  Diagnosis Date  . CAD (coronary artery disease)    mild nonobstructive disease on cath in 2003  . Cancer ([place])   . Cardiomyopathy    Probable Takotsubo, severe CP w/ normal cath in 1994. Severe CP in 2003 w/ widespread T wave inversions on ECG. Cath w/ minimal coronary disease but LV-gram showed periapical severe hypokinesis and basilar hyperkinesia (EF 40%). Last echo in 4/09 confirmed full LV functional recovery with EF 60%, no regional wall motion abnormalities, mild to moderate LVH.  Marland Kitchen CVA (cerebral infarction)   . Depression with anxiety 03-29-11   lost husband 3'09  . Diverticulitis   . DVT (deep venous thrombosis) ([place])    after venous ablation, R leg  . E. coli gastroenteritis 03-29-11   8'10  . Fracture 09/10/07   L2, status post vertebroplasty of L2 performed by IR  . GERD (gastroesophageal reflux disease)   . Headache(784.0)    migraines  . Hemorrhoids   . Hiatal hernia 03-29-11   no nerve problems  . Hyperlipidemia   . Hyperplastic colon polyp 06/2001  . Hypertension   . Iron deficiency anemia due to chronic blood loss 12/30/2017  . OA (osteoarthritis) of knee 03-29-11   w/ bilateral knee pain-not a problem now  . OSA (obstructive sleep apnea) 03-29-11   no cpap  used, not a problem now.  . Osteoporosis   . Otalgia of both ears    Dr. Simeon Craft  . Polycythemia   . Stroke [facility]) 03-29-11   CVA x2 -last 10'12-?TIA(visual problems)  . Varicose vein     Past Surgical History:  Procedure Laterality Date  . APPENDECTOMY    . BACK SURGERY    . BREAST BIOPSY    . BREAST CYST EXCISION    . BREAST LUMPECTOMY Left 2013   left stage I left breast cancer  . CHOLECYSTECTOMY  04/09/2011   Procedure: LAPAROSCOPIC CHOLECYSTECTOMY WITH INTRAOPERATIVE CHOLANGIOGRAM;  Surgeon: Odis Hollingshead, MD;  Location: [facility];  Service: General;  Laterality: N/A;  . Dental Extraction     L maxillary molar  . KYPHOSIS SURGERY  08/2007   cement used  . OVARIAN CYST SURGERY     left  . TONSILLECTOMY    . TOTAL KNEE ARTHROPLASTY  06/2010   left  . TUBAL LIGATION      Social History   Socioeconomic History  . Marital status: Widowed    Spouse name: Not on file  . Number of children: 3  . Years of education: Not on file  . Highest education level: Not on file  Occupational History  . Occupation: n/a  Social Needs  . Financial resource strain: Not on file  . Food insecurity:    Worry: Not  on file    Inability: Not on file  . Transportation needs:    Medical: Not on file    Non-medical: Not on file  Tobacco Use  . Smoking status: Former Smoker    Packs/day: 1.00    Years: 37.00    Pack years: 37.00    Types: Cigarettes    Start date: 06/26/1951    Last attempt to quit: 05/14/1988    Years since quitting: 30.1  . Smokeless tobacco: Never Used  . Tobacco comment: quit 25 years ago  Substance and Sexual Activity  . Alcohol use: Yes    Alcohol/week: 1.0 standard drinks    Types: 1 Cans of beer per week    Comment: occasional/social  . Drug use: No  . Sexual activity: Not Currently  Lifestyle  . Physical activity:    Days per week: Not on file    Minutes per session: Not on file  . Stress: Not on file  Relationships  . Social connections:    Talks on  phone: Not on file    Gets together: Not on file    Attends religious service: Not on file    Active member of club or organization: Not on file    Attends meetings of clubs or organizations: Not on file    Relationship status: Not on file  . Intimate partner violence:    Fear of current or ex partner: Not on file    Emotionally abused: Not on file    Physically abused: Not on file    Forced sexual activity: Not on file  Other Topics Concern  . Not on file  Social History Narrative   Lost husband 07/29/07-    Lives in [place] in a town house by herself, has 3 daughters, lost Mickel Baas [phone])   Early Chars lives in [place], [place] ( [place])     Still drives                Allergies as of 06/23/2018      Reactions   Penicillins Hives, Rash, Other (See Comments)   Has patient had a PCN reaction causing immediate rash, facial/tongue/throat swelling, SOB or lightheadedness with hypotension: Yes Has patient had a PCN reaction causing severe rash involving mucus membranes or skin necrosis: yes Has patient had a PCN reaction that required hospitalization: No Has patient had a PCN reaction occurring within the last 10 years: no If all of the above answers are "NO", then may proceed with Cephalosporin use. Other Reaction: OTHER REACTION   Atorvastatin Diarrhea   REACTION: diarrhe   Ezetimibe    REACTION: nausea   Fluvastatin Sodium Other (See Comments)   "Can't remember"   Magnesium Hydroxide Other (See Comments)   REACTION: triggers HAs   Meloxicam Other (See Comments)   Pt seeing auro's / spots.     Omeprazole Magnesium    auras   Pneumovax [pneumococcal Polysaccharide Vaccine]    Local reaction   Quinapril Hcl Other (See Comments)   03-29-11 "feelings of tiredness"   Simvastatin Diarrhea   Topamax [topiramate] Itching   Valsartan Itching, Other (See Comments)   REACTION: toes itch   Vit D-vit E-safflower Oil Other (See Comments)   Headaches   Carvedilol    "teeth hurt  when I took it" "teeth hurt when I took it"      Medication List       Accurate as of June 23, 2018 11:59 PM. Always use your most recent med  list.        acetaminophen-codeine 300-30 MG tablet Commonly known as:  TYLENOL #3 Take 1 tablet by mouth every 8 (eight) hours as needed. For migraine   aspirin 81 MG tablet Take 81 mg by mouth daily.   co-enzyme Q-10 30 MG capsule Take 30 mg by mouth daily.   dicyclomine 10 MG capsule Commonly known as:  BENTYL Take 1 capsule (10 mg total) by mouth 4 (four) times daily -  before meals and at bedtime.   DULoxetine 30 MG capsule Commonly known as:  CYMBALTA Take 1 capsule (30 mg total) by mouth daily.   FOLIC ACID PO Take by mouth.   hydrochlorothiazide 25 MG tablet Commonly known as:  HYDRODIURIL Take 1 tablet (25 mg total) by mouth daily as needed (swelling in the legs ).   hydroxyurea 500 MG capsule Commonly known as:  HYDREA TAKE 1 CAPSULE BY MOUTH EVERY OTHER DAY TAKE WITH FOOD TO MINIMIZE SIDE EFFECT   LORazepam 0.5 MG tablet Commonly known as:  ATIVAN TAKE 1/2 TABLET BY MOUTH EVERY MORNING AND EVERY AFTERNOON. TAKE 2 TABLETS BY MOUTH AT BEDTIME AS NEEDED FOR ANXIETY   metoprolol tartrate 50 MG tablet Commonly known as:  LOPRESSOR Take 1 tablet (50 mg total) by mouth 2 (two) times daily.   mometasone 0.1 % cream Commonly known as:  ELOCON Apply 1 application topically daily.   omeprazole 20 MG capsule Commonly known as:  PRILOSEC Take 20 mg by mouth daily.           Objective:   Physical Exam BP 126/74 (BP Location: Left Arm, Patient Position: Sitting, Cuff Size: Small)   Pulse 75   Temp (!) 97.5 F (36.4 C) (Oral)   Resp 16   Ht 5\' 6"  (1.676 m)   Wt 151 lb 8 oz (68.7 kg)   SpO2 96%   BMI 24.45 kg/m  General:   Well developed, NAD, BMI noted. HEENT:  Normocephalic . Face symmetric, atraumatic. EOMI, pupils symmetric. Temples: No TTP, good T.A.  Pulses. Lungs:  CTA B Normal respiratory  effort, no intercostal retractions, no accessory muscle use. Heart: RRR,  no murmur.  No pretibial edema bilaterally  Skin: Not pale. Not jaundice Neurologic:  alert & oriented X3.  Speech normal, gait appropriate for age and unassisted Psych--  Cognition and judgment appear intact.  Cooperative with normal attention span and concentration.  Behavior appropriate. No anxious or depressed appearing.      Assessment     Assessment  HTN Dyslipidemia: Intolerant to most medicines including Zetia, Pravachol, Niaspan Depression, anxiety , insomnia - on ativan bid (03-2015 citalopram cause drowsiness, 04-2015 prozac diarrhea) Hem-Onc: POLYCYTEMIA-- sees hem-onc H/o breast ca  MSK: ---DJD :  Tylenol #3, uses rarely ---Osteoporosis:  T score -2.8 (02-2013): declined treatment 06-2017 ---L2 fractures, s/p vertebral plasty 2009 Migraine headaches - Tylenol #3 (rarely)  GI: GERD- intolerant to prevacid, visual disturbances HH, diverticulitis, Colon polyps  --h/o "functional " LLQ abd pain CV: ---Cardiomyopathy Probable Takotsubo cardiomyopathy: CP w/ normal cath in 1994.   Severe CP  2003 with widespread T wave inversions on ECG.  Cath with minimal coronary disease but LV-gram showed periapical severe hypokinesis and basilar hyperkinesia (EF 40%) >>> ECHO 4/09 confirmed full LV functional recovery with EF 60%, mild LVH. 2012: Nl EF per echo, (-) Lexiscan ---Stroke / TIA 2012 Sleep apnea 2012, , not on CPAP H/oDVT, right leg after venous ablation Raynaud phenomena, DX 05/2018  PLAN Fatigue, feeling sleepy,  low-grade fever: Ongoing issue. Chart reviewed, last year had a negative chest x-ray, no worrisome findings on a CT abdomen.  Few months ago, CMP and CBC were satisfactory. Echocardiogram showed no evidence of heart failure 05-2018. She does seem to be depressed but not suicidal, we talk about possible treatment: Wellbutrin?  Cymbalta?  (noting that previously could not tolerate  SSRIs). Plan: Check a sed rate (interpretation could be very difficult given advanced age and no baseline), chest x-ray, vitamin D (history of deficiency).  Unable to check a H68 for folic acid is due to no diagnosis code. Start treatment for depression with a low-dose Cymbalta.  Side effects discussed, including suicidality. Depression: As above RTC 4 w > 25 min

## 2018-06-23 NOTE — Patient Instructions (Signed)
GO TO THE LAB : Get the blood work     GO TO THE FRONT DESK Schedule your next appointment   for a checkup in 4 weeks    STOP BY THE FIRST FLOOR:  get the XR   Start a new medication called Cymbalta, take 1 tablet daily, watch for side effects, call me if questions.

## 2018-06-23 NOTE — Progress Notes (Signed)
Pre visit review using our clinic review tool, if applicable. No additional management support is needed unless otherwise documented below in the visit note. 

## 2018-06-24 ENCOUNTER — Ambulatory Visit (HOSPITAL_BASED_OUTPATIENT_CLINIC_OR_DEPARTMENT_OTHER)
Admission: RE | Admit: 2018-06-24 | Discharge: 2018-06-24 | Disposition: A | Discharge status: Home / Self Care | Payer: Medicare Other | Source: Ambulatory Visit | Attending: Internal Medicine | Admitting: Internal Medicine

## 2018-06-24 ENCOUNTER — Encounter (HOSPITAL_BASED_OUTPATIENT_CLINIC_OR_DEPARTMENT_OTHER): Payer: Medicare Other

## 2018-06-24 DIAGNOSIS — R5383 Other fatigue: Secondary | ICD-10-CM | POA: Insufficient documentation

## 2018-06-24 NOTE — Assessment & Plan Note (Signed)
Fatigue, feeling sleepy, low-grade fever: Ongoing issue. Chart reviewed, last year had a negative chest x-ray, no worrisome findings on a CT abdomen.  Few months ago, CMP and CBC were satisfactory. Echocardiogram showed no evidence of heart failure 05-2018. She does seem to be depressed but not suicidal, we talk about possible treatment: Wellbutrin?  Cymbalta?  (noting that previously could not tolerate SSRIs). Plan: Check a sed rate (interpretation could be very difficult given advanced age and no baseline), chest x-ray, vitamin D (history of deficiency).  Unable to check a R83 for folic acid is due to no diagnosis code. Start treatment for depression with a low-dose Cymbalta.  Side effects discussed, including suicidality. Depression: As above

## 2018-06-27 LAB — VITAMIN D 1,25 DIHYDROXY
Vitamin D 1, 25 (OH)2 Total: 44 pg/mL (ref 18–72)
Vitamin D2 1, 25 (OH)2: <8 pg/mL
Vitamin D3 1, 25 (OH)2: 44 pg/mL

## 2018-07-04 ENCOUNTER — Inpatient Hospital Stay: Payer: Medicare Other | Attending: Hematology & Oncology | Admitting: Family

## 2018-07-04 ENCOUNTER — Inpatient Hospital Stay: Payer: Medicare Other

## 2018-07-04 VITALS — BP 146/46 | HR 65 | Temp 97.8°F | Ht 66.0 in | Wt 152.5 lb

## 2018-07-04 DIAGNOSIS — Z170 Estrogen receptor positive status [ER+]: Secondary | ICD-10-CM

## 2018-07-04 DIAGNOSIS — D45 Polycythemia vera: Secondary | ICD-10-CM | POA: Diagnosis not present

## 2018-07-04 DIAGNOSIS — Z853 Personal history of malignant neoplasm of breast: Secondary | ICD-10-CM | POA: Insufficient documentation

## 2018-07-04 DIAGNOSIS — C50412 Malignant neoplasm of upper-outer quadrant of left female breast: Secondary | ICD-10-CM

## 2018-07-04 DIAGNOSIS — D500 Iron deficiency anemia secondary to blood loss (chronic): Secondary | ICD-10-CM

## 2018-07-04 DIAGNOSIS — D751 Secondary polycythemia: Secondary | ICD-10-CM

## 2018-07-04 LAB — CMP (CANCER CENTER ONLY)
ALT: 6 U/L (ref 0–44)
AST: 13 U/L — ABNORMAL LOW (ref 15–41)
Albumin: 4.1 g/dL (ref 3.5–5.0)
Alkaline Phosphatase: 65 U/L (ref 38–126)
Anion gap: 5 (ref 5–15)
BUN: 11 mg/dL (ref 8–23)
CO2: 31 mmol/L (ref 22–32)
Calcium: 9.3 mg/dL (ref 8.9–10.3)
Chloride: 102 mmol/L (ref 98–111)
Creatinine: 0.79 mg/dL (ref 0.44–1.00)
GFR, Est AFR Am: >60 mL/min (ref >60)
GFR, Estimated: >60 mL/min (ref >60)
Glucose, Bld: 92 mg/dL (ref 70–99)
Potassium: 3.9 mmol/L (ref 3.5–5.1)
Sodium: 138 mmol/L (ref 135–145)
Total Bilirubin: 1.2 mg/dL (ref 0.3–1.2)
Total Protein: 6.2 g/dL — ABNORMAL LOW (ref 6.5–8.1)

## 2018-07-04 LAB — CBC WITH DIFFERENTIAL (CANCER CENTER ONLY)
Abs Immature Granulocytes: 0.03 10*3/uL (ref 0.00–0.07)
Basophils Absolute: 0.1 10*3/uL (ref 0.0–0.1)
Basophils Relative: 1 %
Eosinophils Absolute: 0.2 10*3/uL (ref 0.0–0.5)
Eosinophils Relative: 2 %
HCT: 43.6 % (ref 36.0–46.0)
Hemoglobin: 13.1 g/dL (ref 12.0–15.0)
Immature Granulocytes: 0 %
Lymphocytes Relative: 15 %
Lymphs Abs: 1 10*3/uL (ref 0.7–4.0)
MCH: 25.8 pg — ABNORMAL LOW (ref 26.0–34.0)
MCHC: 30 g/dL (ref 30.0–36.0)
MCV: 86 fL (ref 80.0–100.0)
Monocytes Absolute: 0.5 10*3/uL (ref 0.1–1.0)
Monocytes Relative: 8 %
Neutro Abs: 5.1 10*3/uL (ref 1.7–7.7)
Neutrophils Relative %: 74 %
Platelet Count: 406 10*3/uL — ABNORMAL HIGH (ref 150–400)
RBC: 5.07 MIL/uL (ref 3.87–5.11)
RDW: 14.4 % (ref 11.5–15.5)
WBC Count: 6.8 10*3/uL (ref 4.0–10.5)
nRBC: 0 % (ref 0.0–0.2)

## 2018-07-04 NOTE — Progress Notes (Signed)
Hematology and Oncology Follow Up Visit  [name] [id number] [birth year] 85 y.o. 07/04/2018   Principle Diagnosis:  Polycythemia vera - JAK2 positive Stage I (T1bN0M0 ) infiltrating duct carcinoma the left breast   Past Therapy: Femara 2.5 mg by mouth daily - stopped 08/2015  Current Therapy:   Phlebotomy to maintain hematocrit below 45% Hydrea 500 mg by mouth every other day   Interim History:  [name] is here today for follow-up. She is doing well and has no complaints at this time.  No changes on breast exam today. No lymphadenopathy noted on exam.  No episodes of bleeding, no excessive or unusual bruising. No petechiae.  No fever, chills, n/v, cough, rash, dizziness, SOB, chest pain, palpitations, abdominal pain or changes in bowel or bladder habits.  No swelling, tenderness, numbness or tingling in her extremities.  No lymphadenopathy noted on exam.  She is eating well and staying hydrated. Her weight is stable.     ECOG Performance Status: 0 - Asymptomatic  Medications:  Allergies as of 07/04/2018      Reactions   Penicillins Hives, Rash, Other (See Comments)   Has patient had a PCN reaction causing immediate rash, facial/tongue/throat swelling, SOB or lightheadedness with hypotension: Yes Has patient had a PCN reaction causing severe rash involving mucus membranes or skin necrosis: yes Has patient had a PCN reaction that required hospitalization: No Has patient had a PCN reaction occurring within the last 10 years: no If all of the above answers are "NO", then may proceed with Cephalosporin use. Other Reaction: OTHER REACTION   Atorvastatin Diarrhea   REACTION: diarrhe   Ezetimibe    REACTION: nausea   Fluvastatin Sodium Other (See Comments)   "Can't remember"   Magnesium Hydroxide Other (See Comments)   REACTION: triggers HAs   Meloxicam Other (See Comments)   Pt seeing auro's / spots.     Omeprazole Magnesium    auras   Pneumovax [pneumococcal  Polysaccharide Vaccine]    Local reaction   Quinapril Hcl Other (See Comments)   03-29-11 "feelings of tiredness"   Simvastatin Diarrhea   Topamax [topiramate] Itching   Valsartan Itching, Other (See Comments)   REACTION: toes itch   Vit D-vit E-safflower Oil Other (See Comments)   Headaches   Carvedilol    "teeth hurt when I took it" "teeth hurt when I took it"      Medication List       Accurate as of July 04, 2018  1:31 PM. Always use your most recent med list.        acetaminophen-codeine 300-30 MG tablet Commonly known as:  TYLENOL #3 Take 1 tablet by mouth every 8 (eight) hours as needed. For migraine   aspirin 81 MG tablet Take 81 mg by mouth daily.   co-enzyme Q-10 30 MG capsule Take 30 mg by mouth daily.   dicyclomine 10 MG capsule Commonly known as:  BENTYL Take 1 capsule (10 mg total) by mouth 4 (four) times daily -  before meals and at bedtime.   DULoxetine 30 MG capsule Commonly known as:  CYMBALTA Take 1 capsule (30 mg total) by mouth daily.   FOLIC ACID PO Take by mouth.   hydrochlorothiazide 25 MG tablet Commonly known as:  HYDRODIURIL Take 1 tablet (25 mg total) by mouth daily as needed (swelling in the legs ).   hydroxyurea 500 MG capsule Commonly known as:  HYDREA TAKE 1 CAPSULE BY MOUTH EVERY OTHER DAY TAKE WITH FOOD TO  MINIMIZE SIDE EFFECT   LORazepam 0.5 MG tablet Commonly known as:  ATIVAN TAKE 1/2 TABLET BY MOUTH EVERY MORNING AND EVERY AFTERNOON. TAKE 2 TABLETS BY MOUTH AT BEDTIME AS NEEDED FOR ANXIETY   metoprolol tartrate 50 MG tablet Commonly known as:  LOPRESSOR Take 1 tablet (50 mg total) by mouth 2 (two) times daily.   mometasone 0.1 % cream Commonly known as:  ELOCON Apply 1 application topically daily.   omeprazole 20 MG capsule Commonly known as:  PRILOSEC Take 20 mg by mouth daily.       Allergies:  Allergies  Allergen Reactions  . Penicillins Hives, Rash and Other (See Comments)    Has patient had a PCN  reaction causing immediate rash, facial/tongue/throat swelling, SOB or lightheadedness with hypotension: Yes Has patient had a PCN reaction causing severe rash involving mucus membranes or skin necrosis: yes Has patient had a PCN reaction that required hospitalization: No Has patient had a PCN reaction occurring within the last 10 years: no If all of the above answers are "NO", then may proceed with Cephalosporin use.  Other Reaction: OTHER REACTION  . Atorvastatin Diarrhea    REACTION: diarrhe  . Ezetimibe     REACTION: nausea  . Fluvastatin Sodium Other (See Comments)    "Can't remember"  . Magnesium Hydroxide Other (See Comments)    REACTION: triggers HAs  . Meloxicam Other (See Comments)    Pt seeing auro's / spots.    . Omeprazole Magnesium     auras  . Pneumovax [Pneumococcal Polysaccharide Vaccine]     Local reaction  . Quinapril Hcl Other (See Comments)    03-29-11 "feelings of tiredness"  . Simvastatin Diarrhea  . Topamax [Topiramate] Itching  . Valsartan Itching and Other (See Comments)    REACTION: toes itch  . Vit D-Vit E-Safflower Oil Other (See Comments)    Headaches  . Carvedilol     "teeth hurt when I took it" "teeth hurt when I took it"    Past Medical History, Surgical history, Social history, and Family History were reviewed and updated.  Review of Systems: All other 10 point review of systems is negative.   Physical Exam:  vitals were not taken for this visit.   Wt Readings from Last 3 Encounters:  06/23/18 151 lb 8 oz (68.7 kg)  05/19/18 152 lb 2 oz (69 kg)  05/16/18 153 lb (69.4 kg)    Ocular: Sclerae unicteric, pupils equal, round and reactive to light Ear-nose-throat: Oropharynx clear, dentition fair Lymphatic: No cervical, supraclavicular or axillary adenopathy Lungs no rales or rhonchi, good excursion bilaterally Heart regular rate and rhythm, no murmur appreciated Abd soft, nontender, positive bowel sounds, no liver or spleen tip  palpated on exam, no fluid wave  MSK no focal spinal tenderness, no joint edema Neuro: non-focal, well-oriented, appropriate affect Breasts: Deferred   Lab Results  Component Value Date   WBC 6.8 07/04/2018   HGB 13.1 07/04/2018   HCT 43.6 07/04/2018   MCV 86.0 07/04/2018   PLT 406 (H) 07/04/2018   Lab Results  Component Value Date   FERRITIN 28 05/09/2018   IRON 104 05/09/2018   TIBC 413 05/09/2018   UIBC 308 05/09/2018   IRONPCTSAT 25 05/09/2018   Lab Results  Component Value Date   RETICCTPCT 0.8 02/02/2015   RBC 5.07 07/04/2018   RETICCTABS 41.4 02/02/2015   No results found for: KPAFRELGTCHN, LAMBDASER, KAPLAMBRATIO No results found for: IGGSERUM, IGA, IGMSERUM No results found for: TOTALPROTELP, ALBUMINELP,  Nils Flack, SPEI   Chemistry      Component Value Date/Time   NA 138 07/04/2018 1257   NA 142 03/25/2017 1457   NA 139 04/10/2016 0855   K 3.9 07/04/2018 1257   K 4.1 03/25/2017 1457   K 4.2 04/10/2016 0855   CL 102 07/04/2018 1257   CL 96 (L) 03/25/2017 1457   CO2 31 07/04/2018 1257   CO2 32 03/25/2017 1457   CO2 29 04/10/2016 0855   BUN 11 07/04/2018 1257   BUN 7 03/25/2017 1457   BUN 10.0 04/10/2016 0855   CREATININE 0.79 07/04/2018 1257   CREATININE 1.0 03/25/2017 1457   CREATININE 0.8 04/10/2016 0855      Component Value Date/Time   CALCIUM 9.3 07/04/2018 1257   CALCIUM 9.8 03/25/2017 1457   CALCIUM 9.5 04/10/2016 0855   ALKPHOS 65 07/04/2018 1257   ALKPHOS 86 (H) 03/25/2017 1457   ALKPHOS 85 04/10/2016 0855   AST 13 (L) 07/04/2018 1257   AST 19 04/10/2016 0855   ALT 6 07/04/2018 1257   ALT 15 03/25/2017 1457   ALT 13 04/10/2016 0855   BILITOT 1.2 07/04/2018 1257   BILITOT 1.26 (H) 04/10/2016 0855       Impression and Plan: Ms. Booz is a very pleasant 85 yo caucasian female with polycythemia as well as history of stage I infiltrating ductal carcinoma of the left breast with lumpectomy in 2013.  She  completed treatment with Femara in April 2017. She is doing well and so far there has been no evidence of recurrence.  Her Hct today is 43.6% so no phlebotomy needed this visit.  We will see her back in another 2 months.  She will contact our office with any questions or concerns. We can certainly see her sooner if need be.   [name], NP 2/21/20201:31 PM

## 2018-07-07 LAB — IRON AND TIBC
Iron: 32 ug/dL — ABNORMAL LOW (ref 41–142)
Saturation Ratios: 7 % — ABNORMAL LOW (ref 21–57)
TIBC: 426 ug/dL (ref 236–444)
UIBC: 395 ug/dL — ABNORMAL HIGH (ref 120–384)

## 2018-07-07 LAB — FERRITIN: Ferritin: 26 ng/mL (ref 11–307)

## 2018-07-15 ENCOUNTER — Encounter: Payer: Self-pay | Admitting: Internal Medicine

## 2018-07-15 ENCOUNTER — Ambulatory Visit (INDEPENDENT_AMBULATORY_CARE_PROVIDER_SITE_OTHER): Payer: Medicare Other | Admitting: Internal Medicine

## 2018-07-15 VITALS — BP 128/84 | HR 68 | Temp 98.0°F | Resp 16 | Ht 66.0 in | Wt 152.2 lb

## 2018-07-15 DIAGNOSIS — F341 Dysthymic disorder: Secondary | ICD-10-CM

## 2018-07-15 DIAGNOSIS — I2510 Atherosclerotic heart disease of native coronary artery without angina pectoris: Secondary | ICD-10-CM

## 2018-07-15 DIAGNOSIS — G459 Transient cerebral ischemic attack, unspecified: Secondary | ICD-10-CM

## 2018-07-15 MED ORDER — LORAZEPAM 0.5 MG PO TABS: ORAL_TABLET | ORAL | 1 refills | Status: DC

## 2018-07-15 NOTE — Progress Notes (Signed)
Pre visit review using our clinic review tool, if applicable. No additional management support is needed unless otherwise documented below in the visit note. 

## 2018-07-15 NOTE — Progress Notes (Signed)
Subjective:    Patient ID: [name], female    DOB: July 26, 1929, 85 y.o.   MRN: [id number]  DOS:  07/15/2018 Type of visit - description: Acute visit Symptoms happened one time on 07/11/2018: She was in line at a restaurant suddenly she was unable to recognize a friend for approximately 5 minutes.  She could not understand what she was saying and she did not respond to her. "I was freeze". No loss of consciousness. Few minutes later she was back to normal, no LOC, no confusion. No further symptoms since then.  Thinks she had a stroke.  Depression: See last visit, was prescribed Cymbalta but never started due to cost  Review of Systems   Denies any recent fever, chills or urinary symptoms No chest pain, difficulty breathing or palpitations No headache, dizziness, diplopia, motor deficit or slurred speech. No seizure activity that she can tell, no bladder or bowel incontinence.  Past Medical History:  Diagnosis Date  . CAD (coronary artery disease)    mild nonobstructive disease on cath in 2003  . Cancer ([place])   . Cardiomyopathy    Probable Takotsubo, severe CP w/ normal cath in 1994. Severe CP in 2003 w/ widespread T wave inversions on ECG. Cath w/ minimal coronary disease but LV-gram showed periapical severe hypokinesis and basilar hyperkinesia (EF 40%). Last echo in 4/09 confirmed full LV functional recovery with EF 60%, no regional wall motion abnormalities, mild to moderate LVH.  Marland Kitchen CVA (cerebral infarction)   . Depression with anxiety 03-29-11   lost husband 3'09  . Diverticulitis   . DVT (deep venous thrombosis) ([place])    after venous ablation, R leg  . E. coli gastroenteritis 03-29-11   8'10  . Fracture 09/10/07   L2, status post vertebroplasty of L2 performed by IR  . GERD (gastroesophageal reflux disease)   . Headache(784.0)    migraines  . Hemorrhoids   . Hiatal hernia 03-29-11   no nerve problems  . Hyperlipidemia   . Hyperplastic colon polyp 06/2001  .  Hypertension   . Iron deficiency anemia due to chronic blood loss 12/30/2017  . OA (osteoarthritis) of knee 03-29-11   w/ bilateral knee pain-not a problem now  . OSA (obstructive sleep apnea) 03-29-11   no cpap used, not a problem now.  . Osteoporosis   . Otalgia of both ears    Dr. Simeon Craft  . Polycythemia   . Stroke [facility]) 03-29-11   CVA x2 -last 10'12-?TIA(visual problems)  . Varicose vein     Past Surgical History:  Procedure Laterality Date  . APPENDECTOMY    . BACK SURGERY    . BREAST BIOPSY    . BREAST CYST EXCISION    . BREAST LUMPECTOMY Left 2013   left stage I left breast cancer  . CHOLECYSTECTOMY  04/09/2011   Procedure: LAPAROSCOPIC CHOLECYSTECTOMY WITH INTRAOPERATIVE CHOLANGIOGRAM;  Surgeon: Odis Hollingshead, MD;  Location: [facility] ORS;  Service: General;  Laterality: N/A;  . Dental Extraction     L maxillary molar  . KYPHOSIS SURGERY  08/2007   cement used  . OVARIAN CYST SURGERY     left  . TONSILLECTOMY    . TOTAL KNEE ARTHROPLASTY  06/2010   left  . TUBAL LIGATION      Social History   Socioeconomic History  . Marital status: Widowed    Spouse name: Not on file  . Number of children: 3  . Years of education: Not on file  .  Highest education level: Not on file  Occupational History  . Occupation: n/a  Social Needs  . Financial resource strain: Not on file  . Food insecurity:    Worry: Not on file    Inability: Not on file  . Transportation needs:    Medical: Not on file    Non-medical: Not on file  Tobacco Use  . Smoking status: Former Smoker    Packs/day: 1.00    Years: 37.00    Pack years: 37.00    Types: Cigarettes    Start date: 06/26/1951    Last attempt to quit: 05/14/1988    Years since quitting: 30.1  . Smokeless tobacco: Never Used  . Tobacco comment: quit 25 years ago  Substance and Sexual Activity  . Alcohol use: Yes    Alcohol/week: 1.0 standard drinks    Types: 1 Cans of beer per week    Comment: occasional/social  . Drug use: No    . Sexual activity: Not Currently  Lifestyle  . Physical activity:    Days per week: Not on file    Minutes per session: Not on file  . Stress: Not on file  Relationships  . Social connections:    Talks on phone: Not on file    Gets together: Not on file    Attends religious service: Not on file    Active member of club or organization: Not on file    Attends meetings of clubs or organizations: Not on file    Relationship status: Not on file  . Intimate partner violence:    Fear of current or ex partner: Not on file    Emotionally abused: Not on file    Physically abused: Not on file    Forced sexual activity: Not on file  Other Topics Concern  . Not on file  Social History Narrative   Lost husband 07/29/07-    Lives in [place] in a town house by herself, has 3 daughters, lost Mickel Baas [phone])   Early Chars lives in [place], Margare[place] ( [place])     Still drives                Allergies as of 07/15/2018      Reactions   Penicillins Hives, Rash, Other (See Comments)   Has patient had a PCN reaction causing immediate rash, facial/tongue/throat swelling, SOB or lightheadedness with hypotension: Yes Has patient had a PCN reaction causing severe rash involving mucus membranes or skin necrosis: yes Has patient had a PCN reaction that required hospitalization: No Has patient had a PCN reaction occurring within the last 10 years: no If all of the above answers are "NO", then may proceed with Cephalosporin use. Other Reaction: OTHER REACTION   Atorvastatin Diarrhea   REACTION: diarrhe   Ezetimibe    REACTION: nausea   Fluvastatin Sodium Other (See Comments)   "Can't remember"   Magnesium Hydroxide Other (See Comments)   REACTION: triggers HAs   Meloxicam Other (See Comments)   Pt seeing auro's / spots.     Omeprazole Magnesium    auras   Pneumovax [pneumococcal Polysaccharide Vaccine]    Local reaction   Quinapril Hcl Other (See Comments)   03-29-11 "feelings of tiredness"    Simvastatin Diarrhea   Topamax [topiramate] Itching   Valsartan Itching, Other (See Comments)   REACTION: toes itch   Vit D-vit E-safflower Oil Other (See Comments)   Headaches   Carvedilol    "teeth hurt when I took  it" "teeth hurt when I took it"      Medication List       Accurate as of July 15, 2018 11:59 PM. Always use your most recent med list.        acetaminophen-codeine 300-30 MG tablet Commonly known as:  TYLENOL #3 Take 1 tablet by mouth every 8 (eight) hours as needed. For migraine   aspirin 81 MG tablet Take 81 mg by mouth daily.   co-enzyme Q-10 30 MG capsule Take 30 mg by mouth daily.   dicyclomine 10 MG capsule Commonly known as:  BENTYL Take 1 capsule (10 mg total) by mouth 4 (four) times daily -  before meals and at bedtime.   FOLIC ACID PO Take by mouth.   hydrochlorothiazide 25 MG tablet Commonly known as:  HYDRODIURIL Take 1 tablet (25 mg total) by mouth daily as needed (swelling in the legs ).   hydroxyurea 500 MG capsule Commonly known as:  HYDREA TAKE 1 CAPSULE BY MOUTH EVERY OTHER DAY TAKE WITH FOOD TO MINIMIZE SIDE EFFECT   LORazepam 0.5 MG tablet Commonly known as:  ATIVAN TAKE 1/2 TABLET BY MOUTH EVERY MORNING AND EVERY AFTERNOON. TAKE 2 TABLETS BY MOUTH AT BEDTIME AS NEEDED FOR ANXIETY   metoprolol tartrate 50 MG tablet Commonly known as:  LOPRESSOR Take 1 tablet (50 mg total) by mouth 2 (two) times daily.   mometasone 0.1 % cream Commonly known as:  ELOCON Apply 1 application topically daily.   omeprazole 20 MG capsule Commonly known as:  PRILOSEC Take 20 mg by mouth daily.           Objective:   Physical Exam BP 128/84 (BP Location: Left Arm, Patient Position: Sitting, Cuff Size: Small)   Pulse 68   Temp 98 F (36.7 C) (Oral)   Resp 16   Ht 5\' 6"  (1.676 m)   Wt 152 lb 4 oz (69.1 kg)   SpO2 98%   BMI 24.57 kg/m  General:   Well developed, NAD, BMI noted. HEENT:  Normocephalic . Face symmetric,  atraumatic. Neck: Good carotid pulses with no bruits. Lungs:  CTA B Normal respiratory effort, no intercostal retractions, no accessory muscle use. Heart: RRR,  no murmur.  No pretibial edema bilaterally  Skin: Not pale. Not jaundice Neurologic:  alert & oriented X3.  Speech normal, gait appropriate for age and unassisted. EOMI, pupils equal reactive, motor symmetric.  Face symmetric. Psych--  Cognition and judgment appear intact.  Cooperative with normal attention span and concentration.  Behavior appropriate. No anxious or depressed appearing.      Assessment    Assessment  HTN Dyslipidemia: Intolerant to most medicines including Zetia, Pravachol, Niaspan Depression, anxiety , insomnia - on ativan bid (03-2015 citalopram cause drowsiness, 04-2015 prozac diarrhea) Hem-Onc: POLYCYTEMIA: sees hem-onc H/o breast ca  MSK: ---DJD :  Tylenol #3, uses rarely ---Osteoporosis:  T score -2.8 (02-2013): declined treatment 06-2017 ---L2 fractures, s/p vertebral plasty 2009 Migraine headaches - Tylenol #3 (rarely)  GI: GERD- intolerant to prevacid, visual disturbances HH, diverticulitis, Colon polyps  --h/o "functional " LLQ abd pain CV: ---Cardiomyopathy Probable Takotsubo cardiomyopathy: CP w/ normal cath in 1994.   Severe CP  2003 with widespread T wave inversions on ECG.  Cath with minimal coronary disease but LV-gram showed periapical severe hypokinesis and basilar hyperkinesia (EF 40%) >>> ECHO 4/09 confirmed full LV functional recovery with EF 60%, mild LVH. 2012: Nl EF per echo, (-) Lexiscan 05/21/18 ECHO Nl/stable ---Stroke / TIA 2012 Sleep  apnea 2012, , not on CPAP H/oDVT, right leg after venous ablation Raynaud phenomena, DX 05/2018  PLAN TIA?  Symptoms as described above, TIA vs seizure?.  Patient is now asymptomatic and back to baseline. Recent labs, EKG and echocardiogram normal.  No recent brain imaging. Plan: Continue aspirin for now, check MRI, carotid ultrasound  and a neurology referral. Avoid driving unless is strictly necessary. If symptoms severe, call 911;  if she has another event call us . She verbalized understanding. Depression?  See last visit, unable to afford Cymbalta, symptoms are now "okay". RTC 3 to 4 months

## 2018-07-15 NOTE — Patient Instructions (Addendum)
Cancel the appointment you have in March  Schedule appointment in 3 to 4 months from today  If you have more symptoms and suspect a stroke: Either call 911 if symptoms severe or let us know.

## 2018-07-16 NOTE — Assessment & Plan Note (Signed)
TIA?  Symptoms as described above, TIA vs seizure?.  Patient is now asymptomatic and back to baseline. Recent labs, EKG and echocardiogram normal.  No recent brain imaging. Plan: Continue aspirin for now, check MRI, carotid ultrasound and a neurology referral. Avoid driving unless is strictly necessary. If symptoms severe, call 911;  if she has another event call us . She verbalized understanding. Depression?  See last visit, unable to afford Cymbalta, symptoms are now "okay". RTC 3 to 4 months

## 2018-07-19 ENCOUNTER — Ambulatory Visit (HOSPITAL_BASED_OUTPATIENT_CLINIC_OR_DEPARTMENT_OTHER)
Admission: RE | Admit: 2018-07-19 | Discharge: 2018-07-19 | Disposition: A | Discharge status: Home / Self Care | Payer: Medicare Other | Source: Ambulatory Visit | Attending: Internal Medicine | Admitting: Internal Medicine

## 2018-07-19 DIAGNOSIS — G459 Transient cerebral ischemic attack, unspecified: Secondary | ICD-10-CM | POA: Insufficient documentation

## 2018-07-19 DIAGNOSIS — R55 Syncope and collapse: Secondary | ICD-10-CM | POA: Diagnosis not present

## 2018-07-22 ENCOUNTER — Ambulatory Visit (HOSPITAL_COMMUNITY)
Admission: RE | Admit: 2018-07-22 | Discharge: 2018-07-22 | Disposition: A | Discharge status: Home / Self Care | Payer: Medicare Other | Source: Ambulatory Visit | Attending: Cardiology | Admitting: Cardiology

## 2018-07-22 DIAGNOSIS — G459 Transient cerebral ischemic attack, unspecified: Secondary | ICD-10-CM | POA: Diagnosis not present

## 2018-07-23 ENCOUNTER — Other Ambulatory Visit: Payer: Self-pay | Admitting: Hematology & Oncology

## 2018-07-28 ENCOUNTER — Ambulatory Visit: Payer: Medicare Other | Admitting: Internal Medicine

## 2018-09-05 ENCOUNTER — Inpatient Hospital Stay: Payer: Medicare Other | Attending: Hematology & Oncology

## 2018-09-05 ENCOUNTER — Other Ambulatory Visit: Payer: Medicare Other

## 2018-09-05 ENCOUNTER — Other Ambulatory Visit: Payer: Self-pay

## 2018-09-05 ENCOUNTER — Inpatient Hospital Stay (HOSPITAL_BASED_OUTPATIENT_CLINIC_OR_DEPARTMENT_OTHER): Payer: Medicare Other | Admitting: Hematology & Oncology

## 2018-09-05 ENCOUNTER — Inpatient Hospital Stay: Payer: Medicare Other

## 2018-09-05 ENCOUNTER — Encounter: Payer: Self-pay | Admitting: Hematology & Oncology

## 2018-09-05 ENCOUNTER — Ambulatory Visit: Payer: Medicare Other | Admitting: Hematology & Oncology

## 2018-09-05 VITALS — BP 150/68 | HR 67 | Temp 98.0°F | Resp 16 | Wt 155.0 lb

## 2018-09-05 DIAGNOSIS — C50412 Malignant neoplasm of upper-outer quadrant of left female breast: Secondary | ICD-10-CM

## 2018-09-05 DIAGNOSIS — Z853 Personal history of malignant neoplasm of breast: Secondary | ICD-10-CM | POA: Insufficient documentation

## 2018-09-05 DIAGNOSIS — D45 Polycythemia vera: Secondary | ICD-10-CM | POA: Diagnosis not present

## 2018-09-05 DIAGNOSIS — Z170 Estrogen receptor positive status [ER+]: Secondary | ICD-10-CM

## 2018-09-05 DIAGNOSIS — D751 Secondary polycythemia: Secondary | ICD-10-CM

## 2018-09-05 DIAGNOSIS — D500 Iron deficiency anemia secondary to blood loss (chronic): Secondary | ICD-10-CM

## 2018-09-05 LAB — CMP (CANCER CENTER ONLY)
ALT: 8 U/L (ref 0–44)
AST: 17 U/L (ref 15–41)
Albumin: 4.3 g/dL (ref 3.5–5.0)
Alkaline Phosphatase: 73 U/L (ref 38–126)
Anion gap: 8 (ref 5–15)
BUN: 9 mg/dL (ref 8–23)
CO2: 30 mmol/L (ref 22–32)
Calcium: 9.9 mg/dL (ref 8.9–10.3)
Chloride: 97 mmol/L — ABNORMAL LOW (ref 98–111)
Creatinine: 0.87 mg/dL (ref 0.44–1.00)
GFR, Est AFR Am: >60 mL/min (ref >60)
GFR, Estimated: 60 mL/min — ABNORMAL LOW (ref >60)
Glucose, Bld: 118 mg/dL — ABNORMAL HIGH (ref 70–99)
Potassium: 4.2 mmol/L (ref 3.5–5.1)
Sodium: 135 mmol/L (ref 135–145)
Total Bilirubin: 1.6 mg/dL — ABNORMAL HIGH (ref 0.3–1.2)
Total Protein: 6.9 g/dL (ref 6.5–8.1)

## 2018-09-05 LAB — CBC WITH DIFFERENTIAL (CANCER CENTER ONLY)
Abs Immature Granulocytes: 0.02 10*3/uL (ref 0.00–0.07)
Basophils Absolute: 0.1 10*3/uL (ref 0.0–0.1)
Basophils Relative: 1 %
Eosinophils Absolute: 0.2 10*3/uL (ref 0.0–0.5)
Eosinophils Relative: 2 %
HCT: 44.3 % (ref 36.0–46.0)
Hemoglobin: 13.2 g/dL (ref 12.0–15.0)
Immature Granulocytes: 0 %
Lymphocytes Relative: 16 %
Lymphs Abs: 1.2 10*3/uL (ref 0.7–4.0)
MCH: 24.7 pg — ABNORMAL LOW (ref 26.0–34.0)
MCHC: 29.8 g/dL — ABNORMAL LOW (ref 30.0–36.0)
MCV: 83 fL (ref 80.0–100.0)
Monocytes Absolute: 0.5 10*3/uL (ref 0.1–1.0)
Monocytes Relative: 7 %
Neutro Abs: 5.4 10*3/uL (ref 1.7–7.7)
Neutrophils Relative %: 74 %
Platelet Count: 466 10*3/uL — ABNORMAL HIGH (ref 150–400)
RBC: 5.34 MIL/uL — ABNORMAL HIGH (ref 3.87–5.11)
RDW: 16.3 % — ABNORMAL HIGH (ref 11.5–15.5)
WBC Count: 7.4 10*3/uL (ref 4.0–10.5)
nRBC: 0 % (ref 0.0–0.2)

## 2018-09-05 NOTE — Progress Notes (Signed)
Hematology and Oncology Follow Up Visit  [name] [id number] 10-30-1929 85 y.o. 09/05/2018   Principle Diagnosis:  Polycythemia vera - JAK2 positive Stage I (T1bN0M0 ) infiltrating duct carcinoma the left breast   Past Therapy: Femara 2.5 mg by mouth daily - stopped 08/2015  Current Therapy:   Phlebotomy to maintain hematocrit below 45% Hydrea 500 mg by mouth every other day    Interim History:  [name] is here today for follow-up.  She is doing okay although a real problem for her is that she fell and has quite a bit of pain over on her left side.  It is quite tender.  Sure she probably strained or maybe even fractured a rib.  Otherwise, she is tolerating the coronavirus.  She is staying by herself.  She had a birthday in March and really cannot have a party for this.  She has had no issues with fever.  There is been no cough.  She has had no nausea or vomiting.  She has had no rashes.  There is been no headache.  She definitely is iron deficient.  Her iron studies showed a ferritin of 26 with an iron saturation 7% back in February.  She is not been phlebotomized I think since December.  Overall, her performance status is ECOG 1.      Medications:  Allergies as of 09/05/2018      Reactions   Penicillins Hives, Rash, Other (See Comments)   Has patient had a PCN reaction causing immediate rash, facial/tongue/throat swelling, SOB or lightheadedness with hypotension: Yes Has patient had a PCN reaction causing severe rash involving mucus membranes or skin necrosis: yes Has patient had a PCN reaction that required hospitalization: No Has patient had a PCN reaction occurring within the last 10 years: no If all of the above answers are "NO", then may proceed with Cephalosporin use. Other Reaction: OTHER REACTION   Atorvastatin Diarrhea   REACTION: diarrhe   Ezetimibe    REACTION: nausea   Fluvastatin Sodium Other (See Comments)   "Can't remember"   Magnesium  Hydroxide Other (See Comments)   REACTION: triggers HAs   Meloxicam Other (See Comments)   Pt seeing auro's / spots.     Omeprazole Magnesium    auras   Pneumovax [pneumococcal Polysaccharide Vaccine]    Local reaction   Quinapril Hcl Other (See Comments)   03-29-11 "feelings of tiredness"   Simvastatin Diarrhea   Topamax [topiramate] Itching   Valsartan Itching, Other (See Comments)   REACTION: toes itch   Vit D-vit E-safflower Oil Other (See Comments)   Headaches   Carvedilol    "teeth hurt when I took it" "teeth hurt when I took it"      Medication List       Accurate as of September 05, 2018  3:18 PM. Always use your most recent med list.        acetaminophen-codeine 300-30 MG tablet Commonly known as:  TYLENOL #3 Take 1 tablet by mouth every 8 (eight) hours as needed. For migraine   aspirin 81 MG tablet Take 81 mg by mouth daily.   co-enzyme Q-10 30 MG capsule Take 30 mg by mouth daily.   dicyclomine 10 MG capsule Commonly known as:  Bentyl Take 1 capsule (10 mg total) by mouth 4 (four) times daily -  before meals and at bedtime.   FOLIC ACID PO Take by mouth.   hydrochlorothiazide 25 MG tablet Commonly known as:  HYDRODIURIL Take 1  tablet (25 mg total) by mouth daily as needed (swelling in the legs ).   hydroxyurea 500 MG capsule Commonly known as:  HYDREA TAKE 1 CAPSULE BY MOUTH EVERY OTHER DAY TAKE WITH FOOD TO MINIMIZE SIDE EFFECT   LORazepam 0.5 MG tablet Commonly known as:  ATIVAN TAKE 1/2 TABLET BY MOUTH EVERY MORNING AND EVERY AFTERNOON. TAKE 2 TABLETS BY MOUTH AT BEDTIME AS NEEDED FOR ANXIETY   metoprolol tartrate 50 MG tablet Commonly known as:  LOPRESSOR Take 1 tablet (50 mg total) by mouth 2 (two) times daily.   mometasone 0.1 % cream Commonly known as:  Elocon Apply 1 application topically daily.   omeprazole 20 MG capsule Commonly known as:  PRILOSEC Take 20 mg by mouth daily.       Allergies:  Allergies  Allergen Reactions  .  Penicillins Hives, Rash and Other (See Comments)    Has patient had a PCN reaction causing immediate rash, facial/tongue/throat swelling, SOB or lightheadedness with hypotension: Yes Has patient had a PCN reaction causing severe rash involving mucus membranes or skin necrosis: yes Has patient had a PCN reaction that required hospitalization: No Has patient had a PCN reaction occurring within the last 10 years: no If all of the above answers are "NO", then may proceed with Cephalosporin use.  Other Reaction: OTHER REACTION  . Atorvastatin Diarrhea    REACTION: diarrhe  . Ezetimibe     REACTION: nausea  . Fluvastatin Sodium Other (See Comments)    "Can't remember"  . Magnesium Hydroxide Other (See Comments)    REACTION: triggers HAs  . Meloxicam Other (See Comments)    Pt seeing auro's / spots.    . Omeprazole Magnesium     auras  . Pneumovax [Pneumococcal Polysaccharide Vaccine]     Local reaction  . Quinapril Hcl Other (See Comments)    03-29-11 "feelings of tiredness"  . Simvastatin Diarrhea  . Topamax [Topiramate] Itching  . Valsartan Itching and Other (See Comments)    REACTION: toes itch  . Vit D-Vit E-Safflower Oil Other (See Comments)    Headaches  . Carvedilol     "teeth hurt when I took it" "teeth hurt when I took it"    Past Medical History, Surgical history, Social history, and Family History were reviewed and updated.  Review of Systems: Review of Systems  Constitutional: Negative.   HENT: Negative.   Eyes: Negative.   Respiratory: Negative.   Cardiovascular: Negative.   Gastrointestinal: Negative.   Genitourinary: Negative.   Musculoskeletal: Negative.   Skin: Negative.   Neurological: Negative.   Endo/Heme/Allergies: Negative.   Psychiatric/Behavioral: Negative.      Physical Exam:  vitals were not taken for this visit.   Wt Readings from Last 3 Encounters:  07/15/18 152 lb 4 oz (69.1 kg)  07/04/18 152 lb 8 oz (69.2 kg)  06/23/18 151 lb 8 oz  (68.7 kg)    Physical Exam Vitals signs reviewed.  HENT:     Head: Normocephalic and atraumatic.  Eyes:     Pupils: Pupils are equal, round, and reactive to light.  Neck:     Musculoskeletal: Normal range of motion.  Cardiovascular:     Rate and Rhythm: Normal rate and regular rhythm.     Heart sounds: Normal heart sounds.  Pulmonary:     Effort: Pulmonary effort is normal.     Breath sounds: Normal breath sounds.  Abdominal:     General: Bowel sounds are normal.     Palpations:  Abdomen is soft.  Musculoskeletal: Normal range of motion.        General: No tenderness or deformity.  Lymphadenopathy:     Cervical: No cervical adenopathy.  Skin:    General: Skin is warm and dry.     Findings: No erythema or rash.  Neurological:     Mental Status: She is alert and oriented to person, place, and time.  Psychiatric:        Behavior: Behavior normal.        Thought Content: Thought content normal.        Judgment: Judgment normal.      Lab Results  Component Value Date   WBC 7.4 09/05/2018   HGB 13.2 09/05/2018   HCT 44.3 09/05/2018   MCV 83.0 09/05/2018   PLT 466 (H) 09/05/2018   Lab Results  Component Value Date   FERRITIN 26 07/04/2018   IRON 32 (L) 07/04/2018   TIBC 426 07/04/2018   UIBC 395 (H) 07/04/2018   IRONPCTSAT 7 (L) 07/04/2018   Lab Results  Component Value Date   RETICCTPCT 0.8 02/02/2015   RBC 5.34 (H) 09/05/2018   RETICCTABS 41.4 02/02/2015   No results found for: KPAFRELGTCHN, LAMBDASER, KAPLAMBRATIO No results found for: Kandis Cocking, IGMSERUM No results found for: Odetta Pink, SPEI   Chemistry      Component Value Date/Time   NA 135 09/05/2018 1402   NA 142 03/25/2017 1457   NA 139 04/10/2016 0855   K 4.2 09/05/2018 1402   K 4.1 03/25/2017 1457   K 4.2 04/10/2016 0855   CL 97 (L) 09/05/2018 1402   CL 96 (L) 03/25/2017 1457   CO2 30 09/05/2018 1402   CO2 32 03/25/2017 1457    CO2 29 04/10/2016 0855   BUN 9 09/05/2018 1402   BUN 7 03/25/2017 1457   BUN 10.0 04/10/2016 0855   CREATININE 0.87 09/05/2018 1402   CREATININE 1.0 03/25/2017 1457   CREATININE 0.8 04/10/2016 0855      Component Value Date/Time   CALCIUM 9.9 09/05/2018 1402   CALCIUM 9.8 03/25/2017 1457   CALCIUM 9.5 04/10/2016 0855   ALKPHOS 73 09/05/2018 1402   ALKPHOS 86 (H) 03/25/2017 1457   ALKPHOS 85 04/10/2016 0855   AST 17 09/05/2018 1402   AST 19 04/10/2016 0855   ALT 8 09/05/2018 1402   ALT 15 03/25/2017 1457   ALT 13 04/10/2016 0855   BILITOT 1.6 (H) 09/05/2018 1402   BILITOT 1.26 (H) 04/10/2016 0855       Impression and Plan: Ms. Kettlewell is a very pleasant 85 yo caucasian female with polycythemia and history of stage I infiltrating ductal carcinoma of the left breast with lumpectomy in 2013.   She completed treatment with Femara in April 2017.   We do not have to phlebotomize her today.  I am happy about this.  We will get her back in 2 more months.     Volanda Napoleon, MD 4/24/20203:18 PM

## 2018-09-08 LAB — IRON AND TIBC
Iron: 31 ug/dL — ABNORMAL LOW (ref 41–142)
Saturation Ratios: 7 % — ABNORMAL LOW (ref 21–57)
TIBC: 451 ug/dL — ABNORMAL HIGH (ref 236–444)
UIBC: 420 ug/dL — ABNORMAL HIGH (ref 120–384)

## 2018-09-08 LAB — FERRITIN: Ferritin: 28 ng/mL (ref 11–307)

## 2018-09-15 ENCOUNTER — Ambulatory Visit (INDEPENDENT_AMBULATORY_CARE_PROVIDER_SITE_OTHER): Payer: Medicare Other | Admitting: Internal Medicine

## 2018-09-15 ENCOUNTER — Ambulatory Visit: Payer: Medicare Other | Admitting: *Deleted

## 2018-09-15 DIAGNOSIS — D751 Secondary polycythemia: Secondary | ICD-10-CM | POA: Diagnosis not present

## 2018-09-15 DIAGNOSIS — I2510 Atherosclerotic heart disease of native coronary artery without angina pectoris: Secondary | ICD-10-CM

## 2018-09-15 DIAGNOSIS — G459 Transient cerebral ischemic attack, unspecified: Secondary | ICD-10-CM | POA: Diagnosis not present

## 2018-09-15 DIAGNOSIS — S20219A Contusion of unspecified front wall of thorax, initial encounter: Secondary | ICD-10-CM

## 2018-09-15 NOTE — Progress Notes (Signed)
Subjective:    Patient ID: [name], female    DOB: Dec 19, 1929, 85 y.o.   MRN: [id number]  DOS:  09/15/2018 Type of visit - description: Attempted  to make this a video visit, due to technical difficulties from the patient side it was not possible  thus we proceeded with a Virtual Visit via Telephone    I connected with@ on 09/16/18 at  1:40 PM EDT by telephone and verified that I am speaking with the correct person using two identifiers.  THIS ENCOUNTER IS A VIRTUAL VISIT DUE TO COVID-19 - PATIENT WAS NOT SEEN IN THE OFFICE. PATIENT HAS CONSENTED TO VIRTUAL VISIT / TELEMEDICINE VISIT   Location of patient: home  Location of provider: office  I discussed the limitations, risks, security and privacy concerns of performing an evaluation and management service by telephone and the availability of in person appointments. I also discussed with the patient that there may be a patient responsible charge related to this service. The patient expressed understanding and agreed to proceed.   History of Present Illness: Follow-up The patient was seen few weeks ago with a question of a TIA. Since then she had no further symptoms Work-up was reviewed.  Also, she was reaching down in a big garbage can and bruise her left ribs at the edge of the can, still somewhat sore but improving.  Medication list and labs reviewed  Review of Systems History of functional LLQ abdominal pain, still has that on and off. Other than that she thinks is doing very well.  Past Medical History:  Diagnosis Date  . CAD (coronary artery disease)    mild nonobstructive disease on cath in 2003  . Cancer ([place])   . Cardiomyopathy    Probable Takotsubo, severe CP w/ normal cath in 1994. Severe CP in 2003 w/ widespread T wave inversions on ECG. Cath w/ minimal coronary disease but LV-gram showed periapical severe hypokinesis and basilar hyperkinesia (EF 40%). Last echo in 4/09 confirmed full LV functional recovery  with EF 60%, no regional wall motion abnormalities, mild to moderate LVH.  Marland Kitchen CVA (cerebral infarction)   . Depression with anxiety 03-29-11   lost husband 3'09  . Diverticulitis   . DVT (deep venous thrombosis) ([place])    after venous ablation, R leg  . E. coli gastroenteritis 03-29-11   8'10  . Fracture 09/10/07   L2, status post vertebroplasty of L2 performed by IR  . GERD (gastroesophageal reflux disease)   . Headache(784.0)    migraines  . Hemorrhoids   . Hiatal hernia 03-29-11   no nerve problems  . Hyperlipidemia   . Hyperplastic colon polyp 06/2001  . Hypertension   . Iron deficiency anemia due to chronic blood loss 12/30/2017  . OA (osteoarthritis) of knee 03-29-11   w/ bilateral knee pain-not a problem now  . OSA (obstructive sleep apnea) 03-29-11   no cpap used, not a problem now.  . Osteoporosis   . Otalgia of both ears    Dr. Simeon Craft  . Polycythemia   . Stroke [facility]) 03-29-11   CVA x2 -last 10'12-?TIA(visual problems)  . Varicose vein     Past Surgical History:  Procedure Laterality Date  . APPENDECTOMY    . BACK SURGERY    . BREAST BIOPSY    . BREAST CYST EXCISION    . BREAST LUMPECTOMY Left 2013   left stage I left breast cancer  . CHOLECYSTECTOMY  04/09/2011   Procedure: LAPAROSCOPIC CHOLECYSTECTOMY WITH INTRAOPERATIVE  CHOLANGIOGRAM;  Surgeon: Odis Hollingshead, MD;  Location: [facility] ORS;  Service: General;  Laterality: N/A;  . Dental Extraction     L maxillary molar  . KYPHOSIS SURGERY  08/2007   cement used  . OVARIAN CYST SURGERY     left  . TONSILLECTOMY    . TOTAL KNEE ARTHROPLASTY  06/2010   left  . TUBAL LIGATION      Social History   Socioeconomic History  . Marital status: Widowed    Spouse name: Not on file  . Number of children: 3  . Years of education: Not on file  . Highest education level: Not on file  Occupational History  . Occupation: n/a  Social Needs  . Financial resource strain: Not on file  . Food insecurity:    Worry: Not on  file    Inability: Not on file  . Transportation needs:    Medical: Not on file    Non-medical: Not on file  Tobacco Use  . Smoking status: Former Smoker    Packs/day: 1.00    Years: 37.00    Pack years: 37.00    Types: Cigarettes    Start date: 06/26/1951    Last attempt to quit: 05/14/1988    Years since quitting: 30.3  . Smokeless tobacco: Never Used  . Tobacco comment: quit 25 years ago  Substance and Sexual Activity  . Alcohol use: Yes    Alcohol/week: 1.0 standard drinks    Types: 1 Cans of beer per week    Comment: occasional/social  . Drug use: No  . Sexual activity: Not Currently  Lifestyle  . Physical activity:    Days per week: Not on file    Minutes per session: Not on file  . Stress: Not on file  Relationships  . Social connections:    Talks on phone: Not on file    Gets together: Not on file    Attends religious service: Not on file    Active member of club or organization: Not on file    Attends meetings of clubs or organizations: Not on file    Relationship status: Not on file  . Intimate partner violence:    Fear of current or ex partner: Not on file    Emotionally abused: Not on file    Physically abused: Not on file    Forced sexual activity: Not on file  Other Topics Concern  . Not on file  Social History Narrative   Lost husband 07/29/07-    Lives in [place] in a town house by herself, has 3 daughters, lost Mickel Baas [phone])   Early Chars lives in [place], Margaretha [place] ( [place])     Still drives                Allergies as of 09/15/2018      Reactions   Penicillins Hives, Rash, Other (See Comments)   Has patient had a PCN reaction causing immediate rash, facial/tongue/throat swelling, SOB or lightheadedness with hypotension: Yes Has patient had a PCN reaction causing severe rash involving mucus membranes or skin necrosis: yes Has patient had a PCN reaction that required hospitalization: No Has patient had a PCN reaction occurring within the last 10  years: no If all of the above answers are "NO", then may proceed with Cephalosporin use. Other Reaction: OTHER REACTION   Atorvastatin Diarrhea   REACTION: diarrhe   Ezetimibe    REACTION: nausea   Fluvastatin Sodium Other (See Comments)   "  Can't remember"   Magnesium Hydroxide Other (See Comments)   REACTION: triggers HAs   Meloxicam Other (See Comments)   Pt seeing auro's / spots.     Omeprazole Magnesium    auras   Pneumovax [pneumococcal Polysaccharide Vaccine]    Local reaction   Quinapril Hcl Other (See Comments)   03-29-11 "feelings of tiredness"   Simvastatin Diarrhea   Topamax [topiramate] Itching   Valsartan Itching, Other (See Comments)   REACTION: toes itch   Vit D-vit E-safflower Oil Other (See Comments)   Headaches   Carvedilol    "teeth hurt when I took it" "teeth hurt when I took it"      Medication List       Accurate as of Sep 15, 2018 11:59 PM. Always use your most recent med list.        acetaminophen-codeine 300-30 MG tablet Commonly known as:  TYLENOL #3 Take 1 tablet by mouth every 8 (eight) hours as needed. For migraine   aspirin 81 MG tablet Take 81 mg by mouth daily.   co-enzyme Q-10 30 MG capsule Take 30 mg by mouth daily.   dicyclomine 10 MG capsule Commonly known as:  Bentyl Take 1 capsule (10 mg total) by mouth 4 (four) times daily -  before meals and at bedtime.   FOLIC ACID PO Take by mouth.   hydrochlorothiazide 25 MG tablet Commonly known as:  HYDRODIURIL Take 1 tablet (25 mg total) by mouth daily as needed (swelling in the legs ).   hydroxyurea 500 MG capsule Commonly known as:  HYDREA TAKE 1 CAPSULE BY MOUTH EVERY OTHER DAY TAKE WITH FOOD TO MINIMIZE SIDE EFFECT   LORazepam 0.5 MG tablet Commonly known as:  ATIVAN TAKE 1/2 TABLET BY MOUTH EVERY MORNING AND EVERY AFTERNOON. TAKE 2 TABLETS BY MOUTH AT BEDTIME AS NEEDED FOR ANXIETY   metoprolol tartrate 50 MG tablet Commonly known as:  LOPRESSOR Take 1 tablet (50 mg  total) by mouth 2 (two) times daily.   mometasone 0.1 % cream Commonly known as:  Elocon Apply 1 application topically daily.   omeprazole 20 MG capsule Commonly known as:  PRILOSEC Take 20 mg by mouth daily.           Objective:   Physical Exam There were no vitals taken for this visit. This is a virtual phone visit, the patient is alert oriented x3, speech is fluent.    Assessment     Assessment  HTN Dyslipidemia: Intolerant to most medicines including Zetia, Pravachol, Niaspan Depression, anxiety , insomnia - on ativan bid (03-2015 citalopram cause drowsiness, 04-2015 prozac diarrhea) Hem-Onc: POLYCYTEMIA: sees hem-onc H/o breast ca  MSK: ---DJD :  Tylenol #3, uses rarely ---Osteoporosis:  T score -2.8 (02-2013): declined treatment 06-2017 ---L2 fractures, s/p vertebral plasty 2009 Migraine headaches - Tylenol #3 (rarely)  GI: GERD- intolerant to prevacid, visual disturbances HH, diverticulitis, Colon polyps  --h/o "functional " LLQ abd pain CV: ---Cardiomyopathy Probable Takotsubo cardiomyopathy: CP w/ normal cath in 1994.   Severe CP  2003 with widespread T wave inversions on ECG.  Cath with minimal coronary disease but LV-gram showed periapical severe hypokinesis and basilar hyperkinesia (EF 40%) >>> ECHO 4/09 confirmed full LV functional recovery with EF 60%, mild LVH. 2012: Nl EF per echo, (-) Lexiscan 05/21/18 ECHO Nl/stable ---Stroke / TIA 2012 Sleep apnea 2012, , not on CPAP H/oDVT, right leg after venous ablation Raynaud phenomena, DX 05/2018  PLAN TIA?  Work-up since the last OV reviewed  Brain MRI 07/19/2018: Nonacute Carotid ultrasound 07/22/2018 1 to 39% bilaterally No further symptoms. Still in my mind  the question is if she needs to increasing aspirin or switch to Plavix based on symptoms. Neurology visit was canceled due to COVID-19, will ask neurology to see, possibly virtually for further advise on antiplatelet therapy. Chest bruise: Overall  better, observation. LLQ abdominal pain: Chronic issue, not better or worse than before. Polycythemia: Last visit with hematology few days ago, no phlebotomize was needed.  RTC 2 months History of left breast cancer: Finished Femara 08-2015 RTC 4 months    I discussed the assessment and treatment plan with the patient. The patient was provided an opportunity to ask questions and all were answered. The patient agreed with the plan and demonstrated an understanding of the instructions.   The patient was advised to call back or seek an in-person evaluation if the symptoms worsen or if the condition fails to improve as anticipated.  I provided 15 minutes of non-face-to-face time during this encounter.  Kathlene November, MD

## 2018-09-16 NOTE — Assessment & Plan Note (Signed)
TIA?  Work-up since the last OV reviewed Brain MRI 07/19/2018: Nonacute Carotid ultrasound 07/22/2018 1 to 39% bilaterally No further symptoms. Still in my mind  the question is if she needs to increasing aspirin or switch to Plavix based on symptoms. Neurology visit was canceled due to COVID-19, will ask neurology to see, possibly virtually for further advise on antiplatelet therapy. Chest bruise: Overall better, observation. LLQ abdominal pain: Chronic issue, not better or worse than before. Polycythemia: Last visit with hematology few days ago, no phlebotomize was needed.  RTC 2 months History of left breast cancer: Finished Femara 08-2015 RTC 4 months

## 2018-09-19 ENCOUNTER — Ambulatory Visit: Payer: Medicare Other | Admitting: Diagnostic Neuroimaging

## 2018-09-23 ENCOUNTER — Other Ambulatory Visit: Payer: Self-pay | Admitting: Hematology & Oncology

## 2018-09-23 ENCOUNTER — Ambulatory Visit (INDEPENDENT_AMBULATORY_CARE_PROVIDER_SITE_OTHER): Payer: Medicare Other | Admitting: Internal Medicine

## 2018-09-23 DIAGNOSIS — Z1231 Encounter for screening mammogram for malignant neoplasm of breast: Secondary | ICD-10-CM

## 2018-09-23 DIAGNOSIS — I2510 Atherosclerotic heart disease of native coronary artery without angina pectoris: Secondary | ICD-10-CM

## 2018-09-23 DIAGNOSIS — R1032 Left lower quadrant pain: Secondary | ICD-10-CM

## 2018-09-23 DIAGNOSIS — G8929 Other chronic pain: Secondary | ICD-10-CM

## 2018-09-23 NOTE — Progress Notes (Signed)
Subjective:    Patient ID: [name], female    DOB: [birth year], 85 y.o.   MRN: [id number]  DOS:  09/23/2018 Type of visit - description:  Attempted  to make this a video visit, due to technical difficulties from the patient side it was not possible  thus we proceeded with a Virtual Visit via Telephone    I connected with@ on 09/23/18 at 11:00 AM EDT by telephone and verified that I am speaking with the correct person using two identifiers.  THIS ENCOUNTER IS A VIRTUAL VISIT DUE TO COVID-19 - PATIENT WAS NOT SEEN IN THE OFFICE. PATIENT HAS CONSENTED TO VIRTUAL VISIT / TELEMEDICINE VISIT   Location of patient: home  Location of provider: office  I discussed the limitations, risks, security and privacy concerns of performing an evaluation and management service by telephone and the availability of in person appointments. I also discussed with the patient that there may be a patient responsible charge related to this service. The patient expressed understanding and agreed to proceed.   History of Present Illness: Acute visit Patient has ongoing left lower quadrant abdominal pain. The patient reports that is not crampy or sharp, "just a pain, like gas". The patient reports that this is going on since April however on chart review this has been a on and off, long-term problem.  Also, yesterday she felt "air trapped" on the anterior chest, symptoms self resolve within few minutes, she has some nausea but no vomiting.  No radiation.  Symptoms were not exertional.   Review of Systems  No fever chills No diarrhea No heartburn 2 weeks ago, had a BM, she saw a few drops of red blood in the toilet paper or otherwise no rectal bleeding, stools are normal in color.  Past Medical History:  Diagnosis Date  . CAD (coronary artery disease)    mild nonobstructive disease on cath in 2003  . Cancer ([place])   . Cardiomyopathy    Probable Takotsubo, severe CP w/ normal cath in 1994. Severe  CP in 2003 w/ widespread T wave inversions on ECG. Cath w/ minimal coronary disease but LV-gram showed periapical severe hypokinesis and basilar hyperkinesia (EF 40%). Last echo in 4/09 confirmed full LV functional recovery with EF 60%, no regional wall motion abnormalities, mild to moderate LVH.  Marland Kitchen CVA (cerebral infarction)   . Depression with anxiety 03-29-11   lost husband 3'09  . Diverticulitis   . DVT (deep venous thrombosis) ([place])    after venous ablation, R leg  . E. coli gastroenteritis 03-29-11   8'10  . Fracture 09/10/07   L2, status post vertebroplasty of L2 performed by IR  . GERD (gastroesophageal reflux disease)   . Headache(784.0)    migraines  . Hemorrhoids   . Hiatal hernia 03-29-11   no nerve problems  . Hyperlipidemia   . Hyperplastic colon polyp 06/2001  . Hypertension   . Iron deficiency anemia due to chronic blood loss 12/30/2017  . OA (osteoarthritis) of knee 03-29-11   w/ bilateral knee pain-not a problem now  . OSA (obstructive sleep apnea) 03-29-11   no cpap used, not a problem now.  . Osteoporosis   . Otalgia of both ears    Dr. Simeon Craft  . Polycythemia   . Stroke [facility]) 03-29-11   CVA x2 -last 10'12-?TIA(visual problems)  . Varicose vein     Past Surgical History:  Procedure Laterality Date  . APPENDECTOMY    . BACK SURGERY    .  BREAST BIOPSY    . BREAST CYST EXCISION    . BREAST LUMPECTOMY Left 2013   left stage I left breast cancer  . CHOLECYSTECTOMY  04/09/2011   Procedure: LAPAROSCOPIC CHOLECYSTECTOMY WITH INTRAOPERATIVE CHOLANGIOGRAM;  Surgeon: Odis Hollingshead, MD;  Location: [facility];  Service: General;  Laterality: N/A;  . Dental Extraction     L maxillary molar  . KYPHOSIS SURGERY  08/2007   cement used  . OVARIAN CYST SURGERY     left  . TONSILLECTOMY    . TOTAL KNEE ARTHROPLASTY  06/2010   left  . TUBAL LIGATION      Social History   Socioeconomic History  . Marital status: Widowed    Spouse name: Not on file  . Number of  children: 3  . Years of education: Not on file  . Highest education level: Not on file  Occupational History  . Occupation: n/a  Social Needs  . Financial resource strain: Not on file  . Food insecurity:    Worry: Not on file    Inability: Not on file  . Transportation needs:    Medical: Not on file    Non-medical: Not on file  Tobacco Use  . Smoking status: Former Smoker    Packs/day: 1.00    Years: 37.00    Pack years: 37.00    Types: Cigarettes    Start date: 06/26/1951    Last attempt to quit: 05/14/1988    Years since quitting: 30.3  . Smokeless tobacco: Never Used  . Tobacco comment: quit 25 years ago  Substance and Sexual Activity  . Alcohol use: Yes    Alcohol/week: 1.0 standard drinks    Types: 1 Cans of beer per week    Comment: occasional/social  . Drug use: No  . Sexual activity: Not Currently  Lifestyle  . Physical activity:    Days per week: Not on file    Minutes per session: Not on file  . Stress: Not on file  Relationships  . Social connections:    Talks on phone: Not on file    Gets together: Not on file    Attends religious service: Not on file    Active member of club or organization: Not on file    Attends meetings of clubs or organizations: Not on file    Relationship status: Not on file  . Intimate partner violence:    Fear of current or ex partner: Not on file    Emotionally abused: Not on file    Physically abused: Not on file    Forced sexual activity: Not on file  Other Topics Concern  . Not on file  Social History Narrative   Lost husband 07/29/07-    Lives in [place] in a town house by herself, has 3 daughters, lost Mickel Baas [phone])   Early Chars lives in [place], Margaretha [place] ( [place])     Still drives                Allergies as of 09/23/2018      Reactions   Penicillins Hives, Rash, Other (See Comments)   Has patient had a PCN reaction causing immediate rash, facial/tongue/throat swelling, SOB or lightheadedness with hypotension:  Yes Has patient had a PCN reaction causing severe rash involving mucus membranes or skin necrosis: yes Has patient had a PCN reaction that required hospitalization: No Has patient had a PCN reaction occurring within the last 10 years: no If all of the above  answers are "NO", then may proceed with Cephalosporin use. Other Reaction: OTHER REACTION   Atorvastatin Diarrhea   REACTION: diarrhe   Ezetimibe    REACTION: nausea   Fluvastatin Sodium Other (See Comments)   "Can't remember"   Magnesium Hydroxide Other (See Comments)   REACTION: triggers HAs   Meloxicam Other (See Comments)   Pt seeing auro's / spots.     Omeprazole Magnesium    auras   Pneumovax [pneumococcal Polysaccharide Vaccine]    Local reaction   Quinapril Hcl Other (See Comments)   03-29-11 "feelings of tiredness"   Simvastatin Diarrhea   Topamax [topiramate] Itching   Valsartan Itching, Other (See Comments)   REACTION: toes itch   Vit D-vit E-safflower Oil Other (See Comments)   Headaches   Carvedilol    "teeth hurt when I took it" "teeth hurt when I took it"      Medication List       Accurate as of Sep 23, 2018 11:07 AM. If you have any questions, ask your nurse or doctor.        acetaminophen-codeine 300-30 MG tablet Commonly known as:  TYLENOL #3 Take 1 tablet by mouth every 8 (eight) hours as needed. For migraine   aspirin 81 MG tablet Take 81 mg by mouth daily.   co-enzyme Q-10 30 MG capsule Take 30 mg by mouth daily.   dicyclomine 10 MG capsule Commonly known as:  Bentyl Take 1 capsule (10 mg total) by mouth 4 (four) times daily -  before meals and at bedtime.   FOLIC ACID PO Take by mouth.   hydrochlorothiazide 25 MG tablet Commonly known as:  HYDRODIURIL Take 1 tablet (25 mg total) by mouth daily as needed (swelling in the legs ).   hydroxyurea 500 MG capsule Commonly known as:  HYDREA TAKE 1 CAPSULE BY MOUTH EVERY OTHER DAY TAKE WITH FOOD TO MINIMIZE SIDE EFFECT   LORazepam 0.5  MG tablet Commonly known as:  ATIVAN TAKE 1/2 TABLET BY MOUTH EVERY MORNING AND EVERY AFTERNOON. TAKE 2 TABLETS BY MOUTH AT BEDTIME AS NEEDED FOR ANXIETY   metoprolol tartrate 50 MG tablet Commonly known as:  LOPRESSOR Take 1 tablet (50 mg total) by mouth 2 (two) times daily.   mometasone 0.1 % cream Commonly known as:  Elocon Apply 1 application topically daily.   omeprazole 20 MG capsule Commonly known as:  PRILOSEC Take 20 mg by mouth daily.           Objective:   Physical Exam There were no vitals taken for this visit. This is a virtual phone visit, alert oriented x3, no apparent distress.    Assessment     Assessment  HTN Dyslipidemia: Intolerant to most medicines including Zetia, Pravachol, Niaspan Depression, anxiety , insomnia - on ativan bid (03-2015 citalopram cause drowsiness, 04-2015 prozac diarrhea) Hem-Onc: POLYCYTEMIA: sees hem-onc H/o breast ca  MSK: ---DJD :  Tylenol #3, uses rarely ---Osteoporosis:  T score -2.8 (02-2013): declined treatment 06-2017 ---L2 fractures, s/p vertebral plasty 2009 Migraine headaches - Tylenol #3 (rarely)  GI: GERD- intolerant to prevacid, visual disturbances HH, diverticulitis, Colon polyps  h/o "functional " LLQ abd pain CV: ---Cardiomyopathy Probable Takotsubo cardiomyopathy: CP w/ normal cath in 1994.   Severe CP  2003 with widespread T wave inversions on ECG.  Cath with minimal coronary disease but LV-gram showed periapical severe hypokinesis and basilar hyperkinesia (EF 40%) >>> ECHO 4/09 confirmed full LV functional recovery with EF 60%, mild LVH. 2012: Nl EF  per echo, (-) Lexiscan 05/21/18 ECHO Nl/stable ---Stroke / TIA 2012 Sleep apnea 2012, , not on CPAP H/oDVT, right leg after venous ablation Raynaud phenomena, DX 05/2018  PLAN Left lower quadrant abdominal pain: Chronic issue, chart reviewed: Last colonoscopy Dr. Fuller Plan, 07-2008, done for evaluation of abdominal pain, showed diverticuli. Evaluated at the ER  06-2017, CT abdomen negative. Evaluated by me at the office, U/S  abdomen and pelvis 02-2018: Nonacute. Recent labs show no anemia. Plan: Refer to GI.  Further eval? Chest pain: As described above, the patient reports it was different from when she had heart issues.  Recommend to call or ER if symptoms resurface or severe. Patient verbalized understanding.   I discussed the assessment and treatment plan with the patient. The patient was provided an opportunity to ask questions and all were answered. The patient agreed with the plan and demonstrated an understanding of the instructions.   The patient was advised to call back or seek an in-person evaluation if the symptoms worsen or if the condition fails to improve as anticipated.  I provided 15 minutes of non-face-to-face time during this encounter.  Kathlene November, MD

## 2018-09-24 ENCOUNTER — Ambulatory Visit: Payer: Medicare Other | Admitting: Gastroenterology

## 2018-09-24 ENCOUNTER — Telehealth: Payer: Self-pay

## 2018-09-24 ENCOUNTER — Other Ambulatory Visit: Payer: Self-pay

## 2018-09-24 NOTE — Telephone Encounter (Signed)
Patient canceled appt

## 2018-09-24 NOTE — Telephone Encounter (Signed)
Called patient with no answer and no voicemail to leave a message  Covid-19 travel screening questions  Have you traveled in the last 14 days? If yes where?  Do you now or have you had a fever in the last 14 days?  Do you have any respiratory symptoms of shortness of breath or cough now or in the last 14 days?  Do you have a medical history of Congestive Heart Failure?  Do you have a medical history of lung disease?  Do you have any family members or close contacts with diagnosed or suspected Covid-19?

## 2018-09-24 NOTE — Assessment & Plan Note (Signed)
Left lower quadrant abdominal pain: Chronic issue, chart reviewed: Last colonoscopy Dr. Fuller Plan, 07-2008, done for evaluation of abdominal pain, showed diverticuli. Evaluated at the ER 06-2017, CT abdomen negative. Evaluated by me at the office, U/S  abdomen and pelvis 02-2018: Nonacute. Recent labs show no anemia. Plan: Refer to GI.  Further eval? Chest pain: As described above, the patient reports it was different from when she had heart issues.  Recommend to call or ER if symptoms resurface or severe. Patient verbalized understanding.

## 2018-11-07 ENCOUNTER — Encounter: Payer: Self-pay | Admitting: Hematology & Oncology

## 2018-11-07 ENCOUNTER — Inpatient Hospital Stay: Payer: Medicare Other

## 2018-11-07 ENCOUNTER — Other Ambulatory Visit: Payer: Self-pay

## 2018-11-07 ENCOUNTER — Inpatient Hospital Stay: Payer: Medicare Other | Attending: Hematology & Oncology | Admitting: Hematology & Oncology

## 2018-11-07 VITALS — BP 150/56 | HR 62 | Temp 98.1°F | Resp 20 | Wt 160.0 lb

## 2018-11-07 DIAGNOSIS — R600 Localized edema: Secondary | ICD-10-CM

## 2018-11-07 DIAGNOSIS — C50011 Malignant neoplasm of nipple and areola, right female breast: Secondary | ICD-10-CM

## 2018-11-07 DIAGNOSIS — D45 Polycythemia vera: Secondary | ICD-10-CM | POA: Diagnosis not present

## 2018-11-07 DIAGNOSIS — C50412 Malignant neoplasm of upper-outer quadrant of left female breast: Secondary | ICD-10-CM

## 2018-11-07 DIAGNOSIS — D751 Secondary polycythemia: Secondary | ICD-10-CM

## 2018-11-07 DIAGNOSIS — Z853 Personal history of malignant neoplasm of breast: Secondary | ICD-10-CM | POA: Insufficient documentation

## 2018-11-07 DIAGNOSIS — Z170 Estrogen receptor positive status [ER+]: Secondary | ICD-10-CM

## 2018-11-07 LAB — CMP (CANCER CENTER ONLY)
ALT: 9 U/L (ref 0–44)
AST: 16 U/L (ref 15–41)
Albumin: 4.2 g/dL (ref 3.5–5.0)
Alkaline Phosphatase: 67 U/L (ref 38–126)
Anion gap: 7 (ref 5–15)
BUN: 9 mg/dL (ref 8–23)
CO2: 30 mmol/L (ref 22–32)
Calcium: 9.8 mg/dL (ref 8.9–10.3)
Chloride: 101 mmol/L (ref 98–111)
Creatinine: 0.83 mg/dL (ref 0.44–1.00)
GFR, Est AFR Am: >60 mL/min (ref >60)
GFR, Estimated: >60 mL/min (ref >60)
Glucose, Bld: 104 mg/dL — ABNORMAL HIGH (ref 70–99)
Potassium: 4.3 mmol/L (ref 3.5–5.1)
Sodium: 138 mmol/L (ref 135–145)
Total Bilirubin: 1.8 mg/dL — ABNORMAL HIGH (ref 0.3–1.2)
Total Protein: 7 g/dL (ref 6.5–8.1)

## 2018-11-07 LAB — CBC WITH DIFFERENTIAL (CANCER CENTER ONLY)
Abs Immature Granulocytes: 0.02 10*3/uL (ref 0.00–0.07)
Basophils Absolute: 0.1 10*3/uL (ref 0.0–0.1)
Basophils Relative: 2 %
Eosinophils Absolute: 0.2 10*3/uL (ref 0.0–0.5)
Eosinophils Relative: 3 %
HCT: 45 % (ref 36.0–46.0)
Hemoglobin: 13.3 g/dL (ref 12.0–15.0)
Immature Granulocytes: 0 %
Lymphocytes Relative: 19 %
Lymphs Abs: 1.2 10*3/uL (ref 0.7–4.0)
MCH: 24.8 pg — ABNORMAL LOW (ref 26.0–34.0)
MCHC: 29.6 g/dL — ABNORMAL LOW (ref 30.0–36.0)
MCV: 84 fL (ref 80.0–100.0)
Monocytes Absolute: 0.6 10*3/uL (ref 0.1–1.0)
Monocytes Relative: 9 %
Neutro Abs: 4.5 10*3/uL (ref 1.7–7.7)
Neutrophils Relative %: 67 %
Platelet Count: 399 10*3/uL (ref 150–400)
RBC: 5.36 MIL/uL — ABNORMAL HIGH (ref 3.87–5.11)
RDW: 16 % — ABNORMAL HIGH (ref 11.5–15.5)
WBC Count: 6.6 10*3/uL (ref 4.0–10.5)
nRBC: 0 % (ref 0.0–0.2)

## 2018-11-07 NOTE — Progress Notes (Signed)
Hematology and Oncology Follow Up Visit  [name] [id number] [birth year] 85 y.o. 11/07/2018   Principle Diagnosis:  Polycythemia vera - JAK2 positive Stage I (T1bN0M0 ) infiltrating duct carcinoma the left breast   Past Therapy: Femara 2.5 mg by mouth daily - stopped 08/2015  Current Therapy:   Phlebotomy to maintain hematocrit below 45% Hydrea 500 mg by mouth every other day    Interim History:  [name] is here today for follow-up.  Her problem right now is that there might be some neuropathy developing in her feet.  This is in her ankles actually it does not in her feet.  Seems to be moving up her lower legs gradually.  I do not think this is anything from the polycythemia.  I do not think is from iron deficiency.  I do not think there is a medication that she is on.  She is on Hydrea.  Typically, Hydrea will cause ulcerations in the legs.  I do not see anything like that.  I suppose that there might be an outside chance that her blood might be a little on the high side.  We will go ahead and phlebotomize her today and see if this makes a difference.    Shows her mammogram in a couple weeks.    There is no headache.  She has had no bleeding.  There is no change in bowel or bladder habits.  She has had no cough or fever.  Overall, her performance status is ECOG 1.      Medications:  Allergies as of 11/07/2018      Reactions   Penicillins Hives, Rash, Other (See Comments)   Has patient had a PCN reaction causing immediate rash, facial/tongue/throat swelling, SOB or lightheadedness with hypotension: Yes Has patient had a PCN reaction causing severe rash involving mucus membranes or skin necrosis: yes Has patient had a PCN reaction that required hospitalization: No Has patient had a PCN reaction occurring within the last 10 years: no If all of the above answers are "NO", then may proceed with Cephalosporin use. Other Reaction: OTHER REACTION   Atorvastatin Diarrhea    REACTION: diarrhe   Ezetimibe    REACTION: nausea   Fluvastatin Sodium Other (See Comments)   "Can't remember"   Magnesium Hydroxide Other (See Comments)   REACTION: triggers HAs   Meloxicam Other (See Comments)   Pt seeing auro's / spots.     Omeprazole Magnesium    auras   Pneumovax [pneumococcal Polysaccharide Vaccine]    Local reaction   Quinapril Hcl Other (See Comments)   03-29-11 "feelings of tiredness"   Simvastatin Diarrhea   Topamax [topiramate] Itching   Valsartan Itching, Other (See Comments)   REACTION: toes itch   Vit D-vit E-safflower Oil Other (See Comments)   Headaches   Carvedilol    "teeth hurt when I took it" "teeth hurt when I took it"      Medication List       Accurate as of November 07, 2018  2:01 PM. If you have any questions, ask your nurse or doctor.        acetaminophen-codeine 300-30 MG tablet Commonly known as: TYLENOL #3 Take 1 tablet by mouth every 8 (eight) hours as needed. For migraine   aspirin 81 MG tablet Take 81 mg by mouth daily.   bismuth subsalicylate 102 VO/53GU suspension Commonly known as: PEPTO BISMOL Take 30 mLs by mouth every other day.   co-enzyme Q-10 30 MG capsule Take  30 mg by mouth daily.   dicyclomine 10 MG capsule Commonly known as: Bentyl Take 1 capsule (10 mg total) by mouth 4 (four) times daily -  before meals and at bedtime.   FOLIC ACID PO Take by mouth.   hydrochlorothiazide 25 MG tablet Commonly known as: HYDRODIURIL Take 1 tablet (25 mg total) by mouth daily as needed (swelling in the legs ).   hydroxyurea 500 MG capsule Commonly known as: HYDREA TAKE 1 CAPSULE BY MOUTH EVERY OTHER DAY TAKE WITH FOOD TO MINIMIZE SIDE EFFECT   LORazepam 0.5 MG tablet Commonly known as: ATIVAN TAKE 1/2 TABLET BY MOUTH EVERY MORNING AND EVERY AFTERNOON. TAKE 2 TABLETS BY MOUTH AT BEDTIME AS NEEDED FOR ANXIETY What changed: when to take this   metoprolol tartrate 50 MG tablet Commonly known as: LOPRESSOR Take  1 tablet (50 mg total) by mouth 2 (two) times daily.   mometasone 0.1 % cream Commonly known as: Elocon Apply 1 application topically daily.   omeprazole 20 MG capsule Commonly known as: PRILOSEC Take 20 mg by mouth daily.       Allergies:  Allergies  Allergen Reactions  . Penicillins Hives, Rash and Other (See Comments)    Has patient had a PCN reaction causing immediate rash, facial/tongue/throat swelling, SOB or lightheadedness with hypotension: Yes Has patient had a PCN reaction causing severe rash involving mucus membranes or skin necrosis: yes Has patient had a PCN reaction that required hospitalization: No Has patient had a PCN reaction occurring within the last 10 years: no If all of the above answers are "NO", then may proceed with Cephalosporin use.  Other Reaction: OTHER REACTION  . Atorvastatin Diarrhea    REACTION: diarrhe  . Ezetimibe     REACTION: nausea  . Fluvastatin Sodium Other (See Comments)    "Can't remember"  . Magnesium Hydroxide Other (See Comments)    REACTION: triggers HAs  . Meloxicam Other (See Comments)    Pt seeing auro's / spots.    . Omeprazole Magnesium     auras  . Pneumovax [Pneumococcal Polysaccharide Vaccine]     Local reaction  . Quinapril Hcl Other (See Comments)    03-29-11 "feelings of tiredness"  . Simvastatin Diarrhea  . Topamax [Topiramate] Itching  . Valsartan Itching and Other (See Comments)    REACTION: toes itch  . Vit D-Vit E-Safflower Oil Other (See Comments)    Headaches  . Carvedilol     "teeth hurt when I took it" "teeth hurt when I took it"    Past Medical History, Surgical history, Social history, and Family History were reviewed and updated.  Review of Systems: Review of Systems  Constitutional: Negative.   HENT: Negative.   Eyes: Negative.   Respiratory: Negative.   Cardiovascular: Negative.   Gastrointestinal: Negative.   Genitourinary: Negative.   Musculoskeletal: Negative.   Skin: Negative.    Neurological: Negative.   Endo/Heme/Allergies: Negative.   Psychiatric/Behavioral: Negative.      Physical Exam:  weight is 160 lb (72.6 kg). Her oral temperature is 98.1 F (36.7 C). Her blood pressure is 150/56 (abnormal) and her pulse is 62. Her respiration is 20 and oxygen saturation is 100%.   Wt Readings from Last 3 Encounters:  11/07/18 160 lb (72.6 kg)  09/05/18 155 lb (70.3 kg)  07/15/18 152 lb 4 oz (69.1 kg)    Physical Exam Vitals signs reviewed.  HENT:     Head: Normocephalic and atraumatic.  Eyes:     Pupils:  Pupils are equal, round, and reactive to light.  Neck:     Musculoskeletal: Normal range of motion.  Cardiovascular:     Rate and Rhythm: Normal rate and regular rhythm.     Heart sounds: Normal heart sounds.  Pulmonary:     Effort: Pulmonary effort is normal.     Breath sounds: Normal breath sounds.  Abdominal:     General: Bowel sounds are normal.     Palpations: Abdomen is soft.  Musculoskeletal: Normal range of motion.        General: No tenderness or deformity.  Lymphadenopathy:     Cervical: No cervical adenopathy.  Skin:    General: Skin is warm and dry.     Findings: No erythema or rash.  Neurological:     Mental Status: She is alert and oriented to person, place, and time.  Psychiatric:        Behavior: Behavior normal.        Thought Content: Thought content normal.        Judgment: Judgment normal.      Lab Results  Component Value Date   WBC 6.6 11/07/2018   HGB 13.3 11/07/2018   HCT 45.0 11/07/2018   MCV 84.0 11/07/2018   PLT 399 11/07/2018   Lab Results  Component Value Date   FERRITIN 28 09/05/2018   IRON 31 (L) 09/05/2018   TIBC 451 (H) 09/05/2018   UIBC 420 (H) 09/05/2018   IRONPCTSAT 7 (L) 09/05/2018   Lab Results  Component Value Date   RETICCTPCT 0.8 02/02/2015   RBC 5.36 (H) 11/07/2018   RETICCTABS 41.4 02/02/2015   No results found for: KPAFRELGTCHN, LAMBDASER, KAPLAMBRATIO No results found for:  IGGSERUM, IGA, IGMSERUM No results found for: Odetta Pink, SPEI   Chemistry      Component Value Date/Time   NA 138 11/07/2018 1304   NA 142 03/25/2017 1457   NA 139 04/10/2016 0855   K 4.3 11/07/2018 1304   K 4.1 03/25/2017 1457   K 4.2 04/10/2016 0855   CL 101 11/07/2018 1304   CL 96 (L) 03/25/2017 1457   CO2 30 11/07/2018 1304   CO2 32 03/25/2017 1457   CO2 29 04/10/2016 0855   BUN 9 11/07/2018 1304   BUN 7 03/25/2017 1457   BUN 10.0 04/10/2016 0855   CREATININE 0.83 11/07/2018 1304   CREATININE 1.0 03/25/2017 1457   CREATININE 0.8 04/10/2016 0855      Component Value Date/Time   CALCIUM 9.8 11/07/2018 1304   CALCIUM 9.8 03/25/2017 1457   CALCIUM 9.5 04/10/2016 0855   ALKPHOS 67 11/07/2018 1304   ALKPHOS 86 (H) 03/25/2017 1457   ALKPHOS 85 04/10/2016 0855   AST 16 11/07/2018 1304   AST 19 04/10/2016 0855   ALT 9 11/07/2018 1304   ALT 15 03/25/2017 1457   ALT 13 04/10/2016 0855   BILITOT 1.8 (H) 11/07/2018 1304   BILITOT 1.26 (H) 04/10/2016 0855       Impression and Plan: Ms. Baillie is a very pleasant 85 yo caucasian female with polycythemia and history of stage I infiltrating ductal carcinoma of the left breast with lumpectomy in 2013.   I still not sure what this is with the feet with the numbness in the ankles and lower legs.  We will have to see if she does any better with a phlebotomy.  If not, she probably will have to see neurology and have formal testing.  I realize  that at 85 years old, that probably might be problematic for her.  I does want her quality of life to be better.  We will get her back here in a month to see how she is doing.     Volanda Napoleon, MD 6/26/20202:01 PM

## 2018-11-07 NOTE — Patient Instructions (Signed)

## 2018-11-10 LAB — FERRITIN: Ferritin: 31 ng/mL (ref 11–307)

## 2018-11-10 LAB — IRON AND TIBC
Iron: 52 ug/dL (ref 41–142)
Saturation Ratios: 11 % — ABNORMAL LOW (ref 21–57)
TIBC: 464 ug/dL — ABNORMAL HIGH (ref 236–444)
UIBC: 412 ug/dL — ABNORMAL HIGH (ref 120–384)

## 2018-11-10 LAB — LACTATE DEHYDROGENASE: LDH: 157 U/L (ref 98–192)

## 2018-11-17 ENCOUNTER — Ambulatory Visit: Payer: Medicare Other | Admitting: Internal Medicine

## 2018-11-20 ENCOUNTER — Telehealth: Payer: Self-pay | Admitting: Internal Medicine

## 2018-11-20 DIAGNOSIS — F341 Dysthymic disorder: Secondary | ICD-10-CM

## 2018-11-20 NOTE — Telephone Encounter (Signed)
Lorazepam refill.   Last OV: 09/23/2018 Last Fill: 07/15/2018 #90 and 1RF Pt sig: 1/2 tablet qam, qpm, and 2 at bedtime as needed UDS: 05/19/2018 Low risk

## 2018-11-20 NOTE — Telephone Encounter (Signed)
Sent!

## 2018-11-25 ENCOUNTER — Other Ambulatory Visit: Payer: Self-pay

## 2018-11-25 ENCOUNTER — Ambulatory Visit
Admission: RE | Admit: 2018-11-25 | Discharge: 2018-11-25 | Disposition: A | Discharge status: Home / Self Care | Payer: Medicare Other | Source: Ambulatory Visit | Attending: Hematology & Oncology | Admitting: Hematology & Oncology

## 2018-11-25 DIAGNOSIS — Z1231 Encounter for screening mammogram for malignant neoplasm of breast: Secondary | ICD-10-CM | POA: Diagnosis not present

## 2018-11-30 NOTE — Progress Notes (Signed)
Cardiology Office Note:    Date:  12/01/2018   ID:  [name], DOB 1929/08/13, MRN [id number]  PCP:  Colon Branch, MD  Cardiologist:  Shirlee More, MD    Referring MD: Colon Branch, MD    ASSESSMENT:    1. SOB (shortness of breath)   2. At risk for sleep apnea   3. Polycythemia    PLAN:    In order of problems listed above:  1. She has symptoms of heart failure without heart failure raising concerns for obstructive sleep apnea Home sleep study ordered 2. Polycythemia stable managed by oncology 3. Hypertension stable continue thiazide diuretic beta-blocker   Next appointment: 6 weeks   Medication Adjustments/Labs and Tests Ordered: Current medicines are reviewed at length with the patient today.  Concerns regarding medicines are outlined above.  No orders of the defined types were placed in this encounter.  No orders of the defined types were placed in this encounter.   Chief Complaint  Patient presents with  . Follow-up  . Coronary Artery Disease  . Hypertension  . Hyperlipidemia    History of Present Illness:    [name] is a 85 y.o. female with a hx of mild coronary artery disease hypertension hyperlipidemia and polycythemia vera.  She was last seen 05/16/2018 with SOB.  Her proBNP level was low 196 and echocardiogram showed normal left ventricular ejection fraction 60 to 65% and mild mitral regurgitation moderate left atrial enlargement. Compliance with diet, lifestyle and medications: Yes  She continues to not feel well some of the symptoms sound like the related polycythemia with fatigue and night sweats but she also has shortness of breath intermittently with and without activities.  She tells me years ago she was to have a sleep study and decided not to she wonders if she needs home oxygen may convince her to go ahead and have a sleep test performed to see if she has symptoms of pseudo-heart failure due to sleep apnea. Past Medical  History:  Diagnosis Date  . CAD (coronary artery disease)    mild nonobstructive disease on cath in 2003  . Cancer ([place])   . Cardiomyopathy    Probable Takotsubo, severe CP w/ normal cath in 1994. Severe CP in 2003 w/ widespread T wave inversions on ECG. Cath w/ minimal coronary disease but LV-gram showed periapical severe hypokinesis and basilar hyperkinesia (EF 40%). Last echo in 4/09 confirmed full LV functional recovery with EF 60%, no regional wall motion abnormalities, mild to moderate LVH.  Marland Kitchen CVA (cerebral infarction)   . Depression with anxiety 03-29-11   lost husband 3'09  . Diverticulitis   . DVT (deep venous thrombosis) ([place])    after venous ablation, R leg  . E. coli gastroenteritis 03-29-11   8'10  . Fracture 09/10/07   L2, status post vertebroplasty of L2 performed by IR  . GERD (gastroesophageal reflux disease)   . Headache(784.0)    migraines  . Hemorrhoids   . Hiatal hernia 03-29-11   no nerve problems  . Hyperlipidemia   . Hyperplastic colon polyp 06/2001  . Hypertension   . Iron deficiency anemia due to chronic blood loss 12/30/2017  . OA (osteoarthritis) of knee 03-29-11   w/ bilateral knee pain-not a problem now  . OSA (obstructive sleep apnea) 03-29-11   no cpap used, not a problem now.  . Osteoporosis   . Otalgia of both ears    Dr. Simeon Craft  . Polycythemia   .  Stroke [facility]) 03-29-11   CVA x2 -last 10'12-?TIA(visual problems)  . Varicose vein     Past Surgical History:  Procedure Laterality Date  . APPENDECTOMY    . BACK SURGERY    . BREAST BIOPSY    . BREAST CYST EXCISION    . BREAST LUMPECTOMY Left 2013   left stage I left breast cancer  . CHOLECYSTECTOMY  04/09/2011   Procedure: LAPAROSCOPIC CHOLECYSTECTOMY WITH INTRAOPERATIVE CHOLANGIOGRAM;  Surgeon: Odis Hollingshead, MD;  Location: [facility] ORS;  Service: General;  Laterality: N/A;  . Dental Extraction     L maxillary molar  . KYPHOSIS SURGERY  08/2007   cement used  . OVARIAN CYST SURGERY     left   . TONSILLECTOMY    . TOTAL KNEE ARTHROPLASTY  06/2010   left  . TUBAL LIGATION      Current Medications: Current Meds  Medication Sig  . acetaminophen-codeine (TYLENOL #3) 300-30 MG tablet Take 1 tablet by mouth every 8 (eight) hours as needed. For migraine  . aspirin 81 MG tablet Take 81 mg by mouth daily.  Marland Kitchen bismuth subsalicylate (PEPTO BISMOL) 262 MG/15ML suspension Take 30 mLs by mouth every other day.  . hydrochlorothiazide (HYDRODIURIL) 25 MG tablet Take 1 tablet (25 mg total) by mouth daily as needed (swelling in the legs ).  . hydroxyurea (HYDREA) 500 MG capsule TAKE 1 CAPSULE BY MOUTH EVERY OTHER DAY TAKE WITH FOOD TO MINIMIZE SIDE EFFECT  . loratadine (CLARITIN) 10 MG tablet Take 10 mg by mouth daily as needed for allergies.  Marland Kitchen LORazepam (ATIVAN) 0.5 MG tablet TAKE 1/2 TABLET BY MOUTH EVERY MORNING AND EVERY AFTERNOON. TAKE 2 TABLETS BY MOUTH AT BEDTIME AS NEEDED FOR ANXIETY.  . metoprolol tartrate (LOPRESSOR) 50 MG tablet Take 1 tablet (50 mg total) by mouth 2 (two) times daily.  . mometasone (ELOCON) 0.1 % cream Apply 1 application topically daily.  Marland Kitchen omeprazole (PRILOSEC) 20 MG capsule Take 20 mg by mouth daily.     Allergies:   Penicillins, Atorvastatin, Ezetimibe, Fluvastatin sodium, Magnesium hydroxide, Meloxicam, Omeprazole magnesium, Pneumovax [pneumococcal polysaccharide vaccine], Quinapril hcl, Simvastatin, Topamax [topiramate], Valsartan, Vit d-vit e-safflower oil, and Carvedilol   Social History   Socioeconomic History  . Marital status: Widowed    Spouse name: Not on file  . Number of children: 3  . Years of education: Not on file  . Highest education level: Not on file  Occupational History  . Occupation: n/a  Social Needs  . Financial resource strain: Not on file  . Food insecurity    Worry: Not on file    Inability: Not on file  . Transportation needs    Medical: Not on file    Non-medical: Not on file  Tobacco Use  . Smoking status: Former Smoker     Packs/day: 1.00    Years: 37.00    Pack years: 37.00    Types: Cigarettes    Start date: 06/26/1951    Quit date: 05/14/1988    Years since quitting: 30.5  . Smokeless tobacco: Never Used  . Tobacco comment: quit 25 years ago  Substance and Sexual Activity  . Alcohol use: Yes    Alcohol/week: 1.0 standard drinks    Types: 1 Glasses of wine per week    Comment: occasional/social  . Drug use: No  . Sexual activity: Not Currently  Lifestyle  . Physical activity    Days per week: Not on file    Minutes per session: Not on  file  . Stress: Not on file  Relationships  . Social Herbalist on phone: Not on file    Gets together: Not on file    Attends religious service: Not on file    Active member of club or organization: Not on file    Attends meetings of clubs or organizations: Not on file    Relationship status: Not on file  Other Topics Concern  . Not on file  Social History Narrative   Lost husband 07/29/07-    Lives in [place] in a town house by herself, has 3 daughters, lost Mickel Baas (03-2017)   Early Chars lives in [place], [place] ( [place])     Still drives               Family History: The patient's family history includes Breast cancer in her mother and sister; Colon cancer in an other family member; Heart disease in her father; Leukemia in her brother. ROS:   Please see the history of present illness.    All other systems reviewed and are negative.  EKGs/Labs/Other Studies Reviewed:    The following studies were reviewed today:    Recent Labs: 05/16/2018: NT-Pro BNP 196 11/07/2018: ALT 9; BUN 9; Creatinine 0.83; Hemoglobin 13.3; Platelet Count 399; Potassium 4.3; Sodium 138  Recent Lipid Panel    Component Value Date/Time   CHOL 212 (H) 03/19/2014 1141   TRIG 110.0 03/19/2014 1141   HDL 47.20 03/19/2014 1141   CHOLHDL 4 03/19/2014 1141   VLDL 22.0 03/19/2014 1141   LDLCALC 143 (H) 03/19/2014 1141   LDLDIRECT 142.3 06/25/2011 0954    Physical  Exam:    VS:  BP 134/76 (BP Location: Right Arm, Patient Position: Sitting, Cuff Size: Normal)   Pulse 88   Temp (!) 97.5 F (36.4 C)   Ht 5\' 6"  (1.676 m)   Wt 162 lb 12.8 oz (73.8 kg)   SpO2 96%   BMI 26.28 kg/m     Wt Readings from Last 3 Encounters:  12/01/18 162 lb 12.8 oz (73.8 kg)  11/07/18 160 lb (72.6 kg)  09/05/18 155 lb (70.3 kg)     GEN:  Well nourished, well developed in no acute distress HEENT: Normal NECK: No JVD; No carotid bruits LYMPHATICS: No lymphadenopathy CARDIAC: RRR, no murmurs, rubs, gallops RESPIRATORY:  Clear to auscultation without rales, wheezing or rhonchi  ABDOMEN: Soft, non-tender, non-distended MUSCULOSKELETAL:  No edema; No deformity  SKIN: Warm and dry NEUROLOGIC:  Alert and oriented x 3 PSYCHIATRIC:  Normal affect    Signed, Shirlee More, MD  12/01/2018 2:59 PM    [place]

## 2018-12-01 ENCOUNTER — Encounter: Payer: Self-pay | Admitting: Cardiology

## 2018-12-01 ENCOUNTER — Other Ambulatory Visit: Payer: Self-pay

## 2018-12-01 ENCOUNTER — Ambulatory Visit (INDEPENDENT_AMBULATORY_CARE_PROVIDER_SITE_OTHER): Payer: Medicare Other | Admitting: Cardiology

## 2018-12-01 VITALS — BP 134/76 | HR 88 | Temp 97.5°F | Ht 66.0 in | Wt 162.8 lb

## 2018-12-01 DIAGNOSIS — D751 Secondary polycythemia: Secondary | ICD-10-CM

## 2018-12-01 DIAGNOSIS — R0602 Shortness of breath: Secondary | ICD-10-CM | POA: Diagnosis not present

## 2018-12-01 DIAGNOSIS — Z9189 Other specified personal risk factors, not elsewhere classified: Secondary | ICD-10-CM

## 2018-12-01 NOTE — Patient Instructions (Signed)
Medication Instructions:  Your physician recommends that you continue on your current medications as directed. Please refer to the Current Medication list given to you today.  If you need a refill on your cardiac medications before your next appointment, please call your pharmacy.   Lab work [employer]  If you have labs (blood work) drawn today and your tests are completely normal, you will receive your results only by: Marland Kitchen MyChart Message (if you have MyChart) OR . A paper copy in the mail If you have any lab test that is abnormal or we need to change your treatment, we will call you to review the results.  Testing/Procedures: Your physician has recommended that you have a sleep study. This test records several body functions during sleep, including: brain activity, eye movement, oxygen and carbon dioxide blood levels, heart rate and rhythm, breathing rate and rhythm, the flow of air through your mouth and nose, snoring, body muscle movements, and chest and belly movement.   Follow-Up: At [facility], you and your health needs are our priority.  As part of our continuing mission to provide you with exceptional heart care, we have created designated Provider Care Teams.  These Care Teams include your primary Cardiologist (physician) and Advanced Practice Providers (APPs -  Physician Assistants and Nurse Practitioners) who all work together to provide you with the care you need, when you need it. . You will need a follow up appointment in 6 weeks.   Sleep Studies A sleep study (polysomnogram) is a series of tests done while you are sleeping. A sleep study records your brain waves, heart rate, breathing rate, oxygen level, and eye and leg movements. A sleep study helps your health care provider:  See how well you sleep.  Diagnose a sleep disorder.  Determine how severe your sleep disorder is.  Create a plan to treat your sleep disorder. Your health care provider may recommend a sleep study  if you:  Feel sleepy on most days.  Snore loudly while sleeping.  Have unusual behaviors while you sleep, such as walking.  Have brief periods in which you stop breathing during sleep (sleepapnea).  Fall asleep suddenly during the day (narcolepsy).  Have trouble falling asleep or staying asleep (insomnia).  Feel like you need to move your legs when trying to fall asleep (restless legs syndrome).  Move your legs by flexing and extending them regularly while asleep (periodic limb movement disorder).  Act out your dreams while you sleep (sleep behavior disorder).  Feel like you cannot move when you first wake up (sleep paralysis). What tests are part of a sleep study? Most sleep studies record the following during sleep:  Brain activity.  Eye movements.  Heart rate and rhythm.  Breathing rate and rhythm.  Blood-oxygen level.  Blood pressure.  Chest and belly movement as you breathe.  Arm and leg movements.  Snoring or other noises.  Body position. Where are sleep studies done? Sleep studies are done at sleep centers. A sleep center may be inside a hospital, office, or clinic. The room where you have the study may look like a hospital room or a hotel room. The health care providers doing the study may come in and out of the room during the study. Most of the time, they will be in another room monitoring your test as you sleep. How are sleep studies done? Most sleep studies are done during a normal period of time for a full night of sleep. You will arrive at  the study center in the evening and go home in the morning. Before the test  Bring your pajamas and toothbrush with you to the sleep study.  Do not have caffeine on the day of your sleep study.  Do not drink alcohol on the day of your sleep study.  Your health care provider will let you know if you should stop taking any of your regular medicines before the test. During the test      Round, sticky patches  with sensors attached to recording wires (electrodes) are placed on your scalp, face, chest, and limbs.  Wires from all the electrodes and sensors run from your bed to a computer. The wires can be taken off and put back on if you need to get out of bed to go to the bathroom.  A sensor is placed over your nose to measure airflow.  A finger clip is put on your finger or ear to measure your blood oxygen level (pulse oximetry).  A belt is placed around your belly and a belt is placed around your chest to measure breathing movements.  If you have signs of the sleep disorder called sleep apnea during your test, you may get a treatment mask to wear for the second half of the night. ? The mask provides positive airway pressure (PAP) to help you breathe better during sleep. This may greatly improve your sleep apnea. ? You will then have all tests done again with the mask in place to see if your measurements and recordings change. After the test  A medical doctor who specializes in sleep will evaluate the results of your sleep study and share them with you and your primary health care provider.  Based on your results, your medical history, and a physical exam, you may be diagnosed with a sleep disorder, such as: ? Sleep apnea. ? Restless legs syndrome. ? Sleep-related behavior disorder. ? Sleep-related movement disorders. ? Sleep-related seizure disorders.  Your health care team will help determine your treatment options based on your diagnosis. This may include: ? Improving your sleep habits (sleep hygiene). ? Wearing a continuous positive airway pressure (CPAP) or bi-level positive airway pressure (BPAP) mask. ? Wearing an oral device at night to improve breathing and reduce snoring. ? Taking medicines. Follow these instructions at home:  Take over-the-counter and prescription medicines only as told by your health care provider.  If you are instructed to use a CPAP or BPAP mask, make sure  you use it nightly as directed.  Make any lifestyle changes that your health care provider recommends.  If you were given a device to open your airway while you sleep, use it only as told by your health care provider.  Do not use any tobacco products, such as cigarettes, chewing tobacco, and e-cigarettes. If you need help quitting, ask your health care provider.  Keep all follow-up visits as told by your health care provider. This is important. Summary  A sleep study (polysomnogram) is a series of tests done while you are sleeping. It shows how well you sleep.  Most sleep studies are done over one full night of sleep. You will arrive at the study center in the evening and go home in the morning.  If you have signs of the sleep disorder called sleep apnea during your test, you may get a treatment mask to wear for the second half of the night.  A medical doctor who specializes in sleep will evaluate the results of your sleep study  and share them with your primary health care provider. This information is not intended to replace advice given to you by your health care provider. Make sure you discuss any questions you have with your health care provider. Document Released: 11/04/2002 Document Revised: 02/14/2018 Document Reviewed: 05/28/2017 Elsevier Patient Education  2020 [employer]. Sleep Study  The sleep study consists of a recording of your brain waves (EEG). Breathing, heart rate and rhythm (ECG), oxygen level, eye movement, and leg movement.  The technician will glue or or paste several electrodes to your scalp, face, chest and legs.  You will have belts around your chest and abdomen to record breathing and a finger clasp to check blood oxygen levels.  A tube at your mouth and nose will detect airflow.  There are no needle sticks or painful procedures of any sort.  You will have your own room, and we will make every effort to attend to your comfort and privacy.  Please prepare for your  study by the following steps:   Please avoid coffee, tea, soda, chocolate and other caffeine foods or beverages after 12:00 noon on the day of your sleep study.   You must arrive with clean (no oils), conditioners or make up, and please make sure that you wash your hair to ensure that your hair and scalp are clean, dry and free of any hair extensions on the day of your study.  This will help to get a good reading of study.  Please try not to nap on the day of your study.  Please bring a list of all your medications.  Bring any medications that you might need during the time you are within the laboratory, including insulin, sleeping pills, pain medication and anxiety medications.  Bring snacks, water or juice  Please bring clothes to sleep in and your normal overnight bag.  Please leave valuable at home, as we will not be responsible for any lost items.  If you have any further questions, please feel free to call our office. Thank you  Please call our office 48 in advance to cancel or reschedule to avoid a $100.00 early cancel or no show fee

## 2018-12-03 ENCOUNTER — Telehealth: Payer: Self-pay

## 2018-12-03 DIAGNOSIS — G4700 Insomnia, unspecified: Secondary | ICD-10-CM

## 2018-12-03 DIAGNOSIS — R0602 Shortness of breath: Secondary | ICD-10-CM

## 2018-12-03 DIAGNOSIS — I5181 Takotsubo syndrome: Secondary | ICD-10-CM

## 2018-12-03 NOTE — Telephone Encounter (Signed)
-----   Message from Lauralee Evener, [place] sent at 12/01/2018  3:07 PM EDT ----- Regarding: RE: Sleep Study Patient has  Medicare and does not require a PA. Ok to schedule. Call [phone] to schedule. ----- Message ----- From: Reuel Boom, RN Sent: 12/01/2018   3:01 PM EDT To: Lauralee Evener, CMA, [facility] Sleep Studies Subject: Sleep Study                                    Pls pre-cert for home sleep study Dr. Shirlee More

## 2018-12-03 NOTE — Telephone Encounter (Signed)
Phoned  Number below to schedule  Home sleep study, no answer, left voice message with return number requesting callback.

## 2018-12-04 ENCOUNTER — Inpatient Hospital Stay: Payer: Medicare Other

## 2018-12-04 ENCOUNTER — Other Ambulatory Visit: Payer: Self-pay

## 2018-12-04 ENCOUNTER — Inpatient Hospital Stay: Payer: Medicare Other | Attending: Hematology & Oncology | Admitting: Family

## 2018-12-04 ENCOUNTER — Encounter: Payer: Self-pay | Admitting: Family

## 2018-12-04 VITALS — BP 135/66 | HR 57 | Temp 97.5°F | Resp 18 | Ht 66.0 in | Wt 161.0 lb

## 2018-12-04 DIAGNOSIS — C50011 Malignant neoplasm of nipple and areola, right female breast: Secondary | ICD-10-CM

## 2018-12-04 DIAGNOSIS — Z853 Personal history of malignant neoplasm of breast: Secondary | ICD-10-CM | POA: Diagnosis not present

## 2018-12-04 DIAGNOSIS — D45 Polycythemia vera: Secondary | ICD-10-CM | POA: Diagnosis not present

## 2018-12-04 DIAGNOSIS — R5383 Other fatigue: Secondary | ICD-10-CM | POA: Diagnosis not present

## 2018-12-04 DIAGNOSIS — D751 Secondary polycythemia: Secondary | ICD-10-CM

## 2018-12-04 DIAGNOSIS — R600 Localized edema: Secondary | ICD-10-CM

## 2018-12-04 DIAGNOSIS — D500 Iron deficiency anemia secondary to blood loss (chronic): Secondary | ICD-10-CM

## 2018-12-04 LAB — CBC WITH DIFFERENTIAL (CANCER CENTER ONLY)
Abs Immature Granulocytes: 0.02 10*3/uL (ref 0.00–0.07)
Basophils Absolute: 0.1 10*3/uL (ref 0.0–0.1)
Basophils Relative: 1 %
Eosinophils Absolute: 0.2 10*3/uL (ref 0.0–0.5)
Eosinophils Relative: 3 %
HCT: 41.6 % (ref 36.0–46.0)
Hemoglobin: 12.3 g/dL (ref 12.0–15.0)
Immature Granulocytes: 0 %
Lymphocytes Relative: 16 %
Lymphs Abs: 1.2 10*3/uL (ref 0.7–4.0)
MCH: 24.5 pg — ABNORMAL LOW (ref 26.0–34.0)
MCHC: 29.6 g/dL — ABNORMAL LOW (ref 30.0–36.0)
MCV: 82.9 fL (ref 80.0–100.0)
Monocytes Absolute: 0.6 10*3/uL (ref 0.1–1.0)
Monocytes Relative: 8 %
Neutro Abs: 5.3 10*3/uL (ref 1.7–7.7)
Neutrophils Relative %: 72 %
Platelet Count: 439 10*3/uL — ABNORMAL HIGH (ref 150–400)
RBC: 5.02 MIL/uL (ref 3.87–5.11)
RDW: 15.4 % (ref 11.5–15.5)
WBC Count: 7.3 10*3/uL (ref 4.0–10.5)
nRBC: 0 % (ref 0.0–0.2)

## 2018-12-04 LAB — CMP (CANCER CENTER ONLY)
ALT: 8 U/L (ref 0–44)
AST: 16 U/L (ref 15–41)
Albumin: 4.3 g/dL (ref 3.5–5.0)
Alkaline Phosphatase: 65 U/L (ref 38–126)
Anion gap: 6 (ref 5–15)
BUN: 8 mg/dL (ref 8–23)
CO2: 32 mmol/L (ref 22–32)
Calcium: 8.8 mg/dL — ABNORMAL LOW (ref 8.9–10.3)
Chloride: 98 mmol/L (ref 98–111)
Creatinine: 0.84 mg/dL (ref 0.44–1.00)
GFR, Est AFR Am: >60 mL/min (ref >60)
GFR, Estimated: >60 mL/min (ref >60)
Glucose, Bld: 114 mg/dL — ABNORMAL HIGH (ref 70–99)
Potassium: 4 mmol/L (ref 3.5–5.1)
Sodium: 136 mmol/L (ref 135–145)
Total Bilirubin: 1.2 mg/dL (ref 0.3–1.2)
Total Protein: 6.6 g/dL (ref 6.5–8.1)

## 2018-12-04 NOTE — Progress Notes (Signed)
Hematology and Oncology Follow Up Visit  [name] [id number] 11/06/1929 85 y.o. 12/04/2018   Principle Diagnosis:  Polycythemia vera - JAK2 positive Stage I (T1bN0M0 ) infiltrating duct carcinoma the left breast   Past Therapy: Femara 2.5 mg by mouth daily - stopped 08/2015  Current Therapy:   Phlebotomy to maintain hematocrit below 45% Hydrea 500 mg by mouth every other day   Interim History:  [name] is here today for follow-up. She is feeling fatigued. She states that she has an appointment for a sleep study next month. She states that she was diagnosed years ago with sleep apnea but never followed up or got a CPAP machine.  She states that she is taking her hydrea 500 mg PO every other day.  Hct is 41.6 so no phlebotomy needed at this time.  She denies noting any bleeding. She does bruise easily but not in excess.  No fever, chills, n/v, cough, rash, dizziness, chest pain, palpitations, abdominal pain or changes in bowel or bladder habits.  She has some mild SOB with over exertion and will take a break to rest as needed.  No swelling, tenderness, numbness or tingling in her extremities.   ECOG Performance Status: 1 - Symptomatic but completely ambulatory  Medications:  Allergies as of 12/04/2018      Reactions   Penicillins Hives, Rash, Other (See Comments)   Has patient had a PCN reaction causing immediate rash, facial/tongue/throat swelling, SOB or lightheadedness with hypotension: Yes Has patient had a PCN reaction causing severe rash involving mucus membranes or skin necrosis: yes Has patient had a PCN reaction that required hospitalization: No Has patient had a PCN reaction occurring within the last 10 years: no If all of the above answers are "NO", then may proceed with Cephalosporin use. Other Reaction: OTHER REACTION   Atorvastatin Diarrhea   REACTION: diarrhe   Ezetimibe    REACTION: nausea   Fluvastatin Sodium Other (See Comments)   "Can't  remember"   Magnesium Hydroxide Other (See Comments)   REACTION: triggers HAs   Meloxicam Other (See Comments)   Pt seeing auro's / spots.     Omeprazole Magnesium    auras   Pneumovax [pneumococcal Polysaccharide Vaccine]    Local reaction   Quinapril Hcl Other (See Comments)   03-29-11 "feelings of tiredness"   Simvastatin Diarrhea   Topamax [topiramate] Itching   Valsartan Itching, Other (See Comments)   REACTION: toes itch   Vit D-vit E-safflower Oil Other (See Comments)   Headaches   Carvedilol    "teeth hurt when I took it" "teeth hurt when I took it"      Medication List       Accurate as of December 04, 2018  2:46 PM. If you have any questions, ask your nurse or doctor.        acetaminophen-codeine 300-30 MG tablet Commonly known as: TYLENOL #3 Take 1 tablet by mouth every 8 (eight) hours as needed. For migraine   aspirin 81 MG tablet Take 81 mg by mouth daily.   bismuth subsalicylate 175 ZW/25EN suspension Commonly known as: PEPTO BISMOL Take 30 mLs by mouth every other day.   hydrochlorothiazide 25 MG tablet Commonly known as: HYDRODIURIL Take 1 tablet (25 mg total) by mouth daily as needed (swelling in the legs ).   hydroxyurea 500 MG capsule Commonly known as: HYDREA TAKE 1 CAPSULE BY MOUTH EVERY OTHER DAY TAKE WITH FOOD TO MINIMIZE SIDE EFFECT   loratadine 10 MG tablet  Commonly known as: CLARITIN Take 10 mg by mouth daily as needed for allergies.   LORazepam 0.5 MG tablet Commonly known as: ATIVAN TAKE 1/2 TABLET BY MOUTH EVERY MORNING AND EVERY AFTERNOON. TAKE 2 TABLETS BY MOUTH AT BEDTIME AS NEEDED FOR ANXIETY.   metoprolol tartrate 50 MG tablet Commonly known as: LOPRESSOR Take 1 tablet (50 mg total) by mouth 2 (two) times daily.   mometasone 0.1 % cream Commonly known as: Elocon Apply 1 application topically daily.   omeprazole 20 MG capsule Commonly known as: PRILOSEC Take 20 mg by mouth daily.       Allergies:  Allergies   Allergen Reactions  . Penicillins Hives, Rash and Other (See Comments)    Has patient had a PCN reaction causing immediate rash, facial/tongue/throat swelling, SOB or lightheadedness with hypotension: Yes Has patient had a PCN reaction causing severe rash involving mucus membranes or skin necrosis: yes Has patient had a PCN reaction that required hospitalization: No Has patient had a PCN reaction occurring within the last 10 years: no If all of the above answers are "NO", then may proceed with Cephalosporin use.  Other Reaction: OTHER REACTION  . Atorvastatin Diarrhea    REACTION: diarrhe  . Ezetimibe     REACTION: nausea  . Fluvastatin Sodium Other (See Comments)    "Can't remember"  . Magnesium Hydroxide Other (See Comments)    REACTION: triggers HAs  . Meloxicam Other (See Comments)    Pt seeing auro's / spots.    . Omeprazole Magnesium     auras  . Pneumovax [Pneumococcal Polysaccharide Vaccine]     Local reaction  . Quinapril Hcl Other (See Comments)    03-29-11 "feelings of tiredness"  . Simvastatin Diarrhea  . Topamax [Topiramate] Itching  . Valsartan Itching and Other (See Comments)    REACTION: toes itch  . Vit D-Vit E-Safflower Oil Other (See Comments)    Headaches  . Carvedilol     "teeth hurt when I took it" "teeth hurt when I took it"    Past Medical History, Surgical history, Social history, and Family History were reviewed and updated.  Review of Systems: All other 10 point review of systems is negative.   Physical Exam:  vitals were not taken for this visit.   Wt Readings from Last 3 Encounters:  12/01/18 162 lb 12.8 oz (73.8 kg)  11/07/18 160 lb (72.6 kg)  09/05/18 155 lb (70.3 kg)    Ocular: Sclerae unicteric, pupils equal, round and reactive to light Ear-nose-throat: Oropharynx clear, dentition fair Lymphatic: No cervical or supraclavicular adenopathy Lungs no rales or rhonchi, good excursion bilaterally Heart regular rate and rhythm, no  murmur appreciated Abd soft, nontender, positive bowel sounds, no liver or spleen tip palpated on exam, no fluid wave  MSK no focal spinal tenderness, no joint edema Neuro: non-focal, well-oriented, appropriate affect Breasts: Deferred   Lab Results  Component Value Date   WBC 7.3 12/04/2018   HGB 12.3 12/04/2018   HCT 41.6 12/04/2018   MCV 82.9 12/04/2018   PLT 439 (H) 12/04/2018   Lab Results  Component Value Date   FERRITIN 31 11/07/2018   IRON 52 11/07/2018   TIBC 464 (H) 11/07/2018   UIBC 412 (H) 11/07/2018   IRONPCTSAT 11 (L) 11/07/2018   Lab Results  Component Value Date   RETICCTPCT 0.8 02/02/2015   RBC 5.02 12/04/2018   RETICCTABS 41.4 02/02/2015   No results found for: KPAFRELGTCHN, LAMBDASER, KAPLAMBRATIO No results found for: IGGSERUM, IGA,  IGMSERUM No results found for: Odetta Pink, SPEI   Chemistry      Component Value Date/Time   NA 138 11/07/2018 1304   NA 142 03/25/2017 1457   NA 139 04/10/2016 0855   K 4.3 11/07/2018 1304   K 4.1 03/25/2017 1457   K 4.2 04/10/2016 0855   CL 101 11/07/2018 1304   CL 96 (L) 03/25/2017 1457   CO2 30 11/07/2018 1304   CO2 32 03/25/2017 1457   CO2 29 04/10/2016 0855   BUN 9 11/07/2018 1304   BUN 7 03/25/2017 1457   BUN 10.0 04/10/2016 0855   CREATININE 0.83 11/07/2018 1304   CREATININE 1.0 03/25/2017 1457   CREATININE 0.8 04/10/2016 0855      Component Value Date/Time   CALCIUM 9.8 11/07/2018 1304   CALCIUM 9.8 03/25/2017 1457   CALCIUM 9.5 04/10/2016 0855   ALKPHOS 67 11/07/2018 1304   ALKPHOS 86 (H) 03/25/2017 1457   ALKPHOS 85 04/10/2016 0855   AST 16 11/07/2018 1304   AST 19 04/10/2016 0855   ALT 9 11/07/2018 1304   ALT 15 03/25/2017 1457   ALT 13 04/10/2016 0855   BILITOT 1.8 (H) 11/07/2018 1304   BILITOT 1.26 (H) 04/10/2016 0855       Impression and Plan: Ms. Way is a very pleasant 85 yo caucasian female with polycythemia and history of  stage I infiltrating ductal carcinoma of the left breast with lumpectomy in 2013.  She is doing well but has some fatigue at times.  She will continue on her same regime with Hydrea.  No phlebotomy needed at this time.  We will see what her iron studies show.  We will plan to see her back in another month.  She will contact our office with any questions or concerns. We can certainly see her sooner if needed.   [name], NP 7/23/20202:46 PM

## 2018-12-05 LAB — IRON AND TIBC
Iron: 22 ug/dL — ABNORMAL LOW (ref 41–142)
Saturation Ratios: 5 % — ABNORMAL LOW (ref 21–57)
TIBC: 474 ug/dL — ABNORMAL HIGH (ref 236–444)
UIBC: 451 ug/dL — ABNORMAL HIGH (ref 120–384)

## 2018-12-05 LAB — TSH: TSH: 2.963 u[IU]/mL (ref 0.308–3.960)

## 2018-12-05 LAB — FERRITIN: Ferritin: 34 ng/mL (ref 11–307)

## 2018-12-08 ENCOUNTER — Ambulatory Visit: Payer: Medicare Other | Admitting: Internal Medicine

## 2018-12-08 ENCOUNTER — Telehealth: Payer: Self-pay | Admitting: Family

## 2018-12-08 NOTE — Telephone Encounter (Signed)
Called and advised patient of follow up appointments per 7/23 los

## 2018-12-19 IMAGING — US US PELVIS COMPLETE TRANSABD/TRANSVAG
1 series · 13 of 25 positions shown · non-contrast
Comparison: Noncontrast CT on 07/01/2017

CLINICAL DATA: Left lower quadrant pain for several months.
Previous history of left ovarian cyst removal. Postmenopausal
female.

EXAM:
TRANSABDOMINAL AND TRANSVAGINAL ULTRASOUND OF PELVIS
TECHNIQUE: Both transabdominal and transvaginal ultrasound examinations of the
pelvis were performed. Transabdominal technique was performed for
global imaging of the pelvis including uterus, ovaries, adnexal
regions, and pelvic cul-de-sac. It was necessary to proceed with
endovaginal exam following the transabdominal exam to visualize the
endometrium and ovaries.

[Series 1: us pelvis complete transabd/transvag · 0.20mm/px · 13 of 65 slices shown]
[im 1/65]
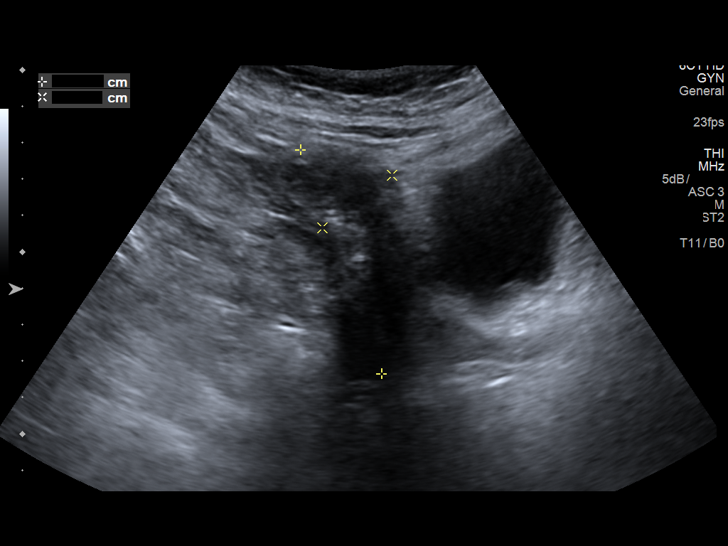
[im 6/65]
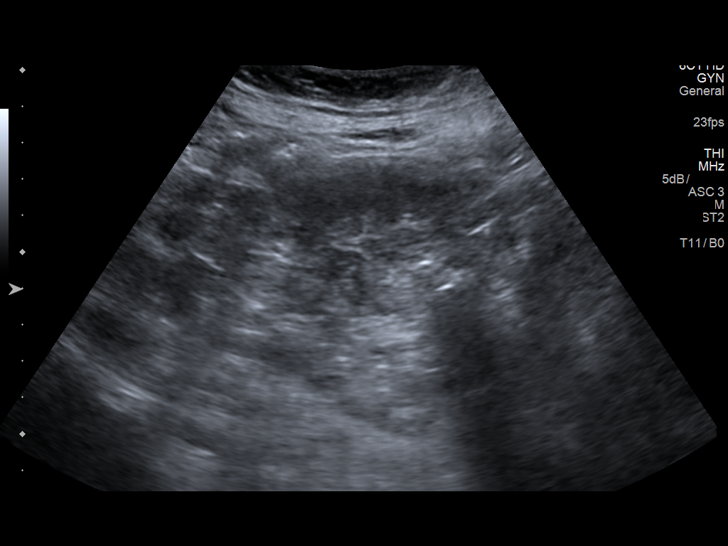
[im 11/65]
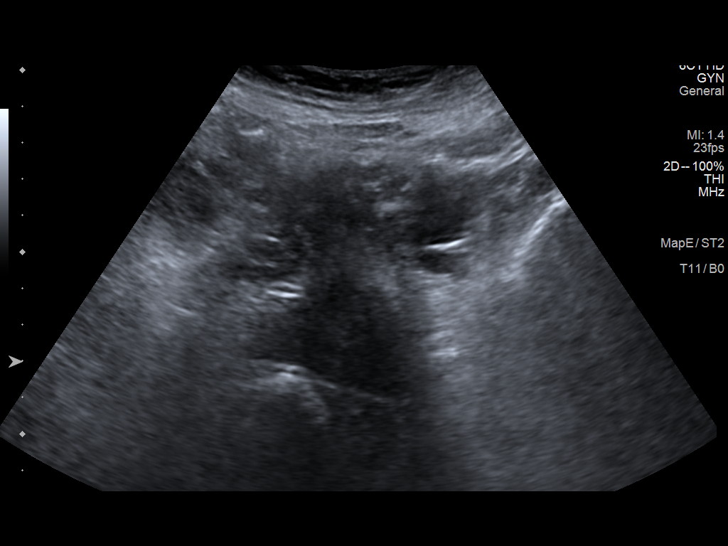
[im 17/65]
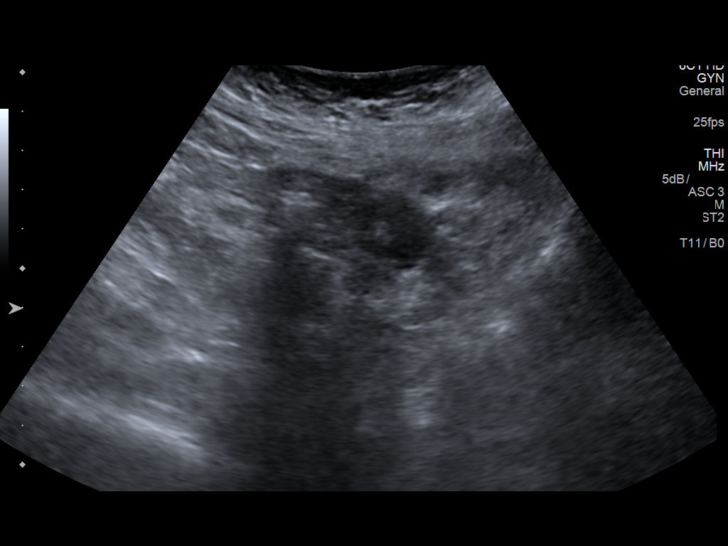
[im 22/65]
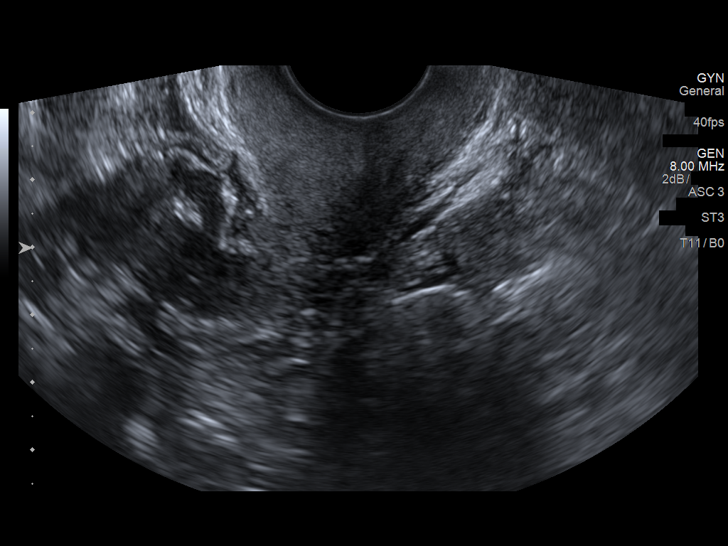
[im 27/65]
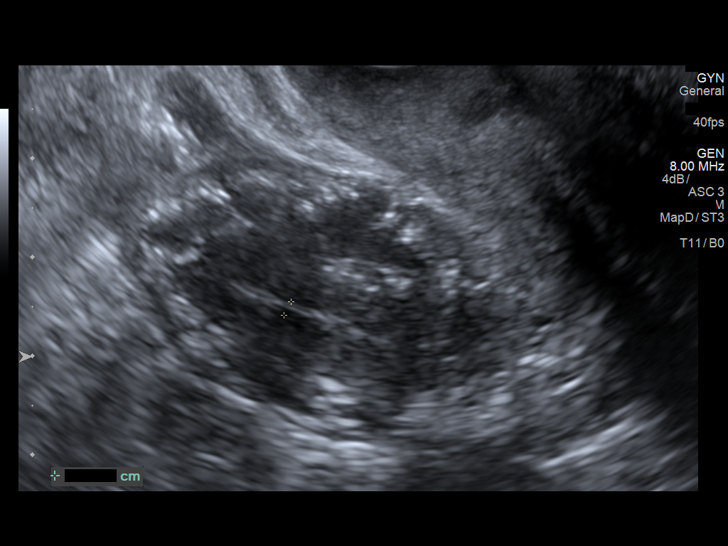
[im 33/65]
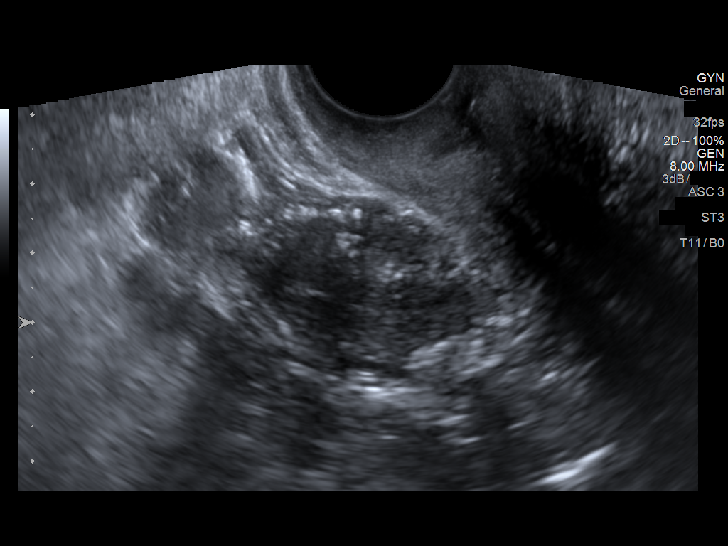
[im 38/65]
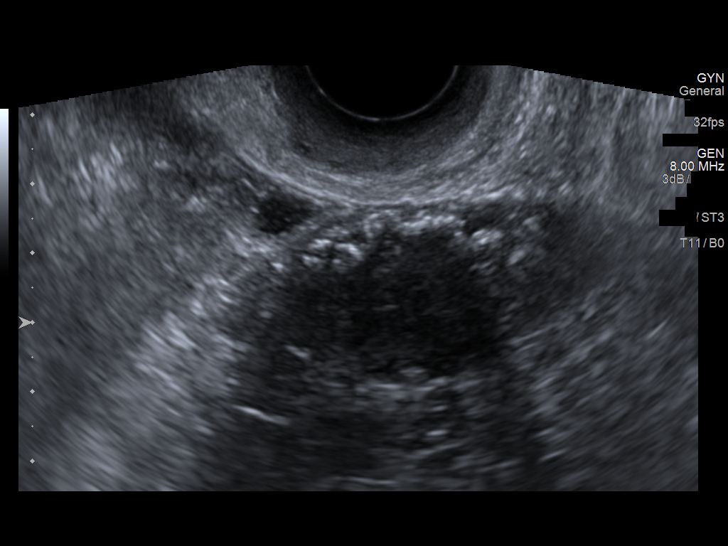
[im 43/65]
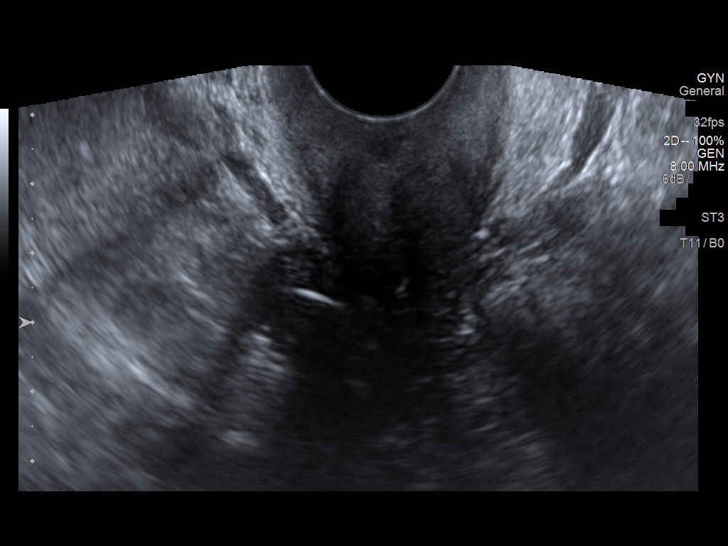
[im 49/65]
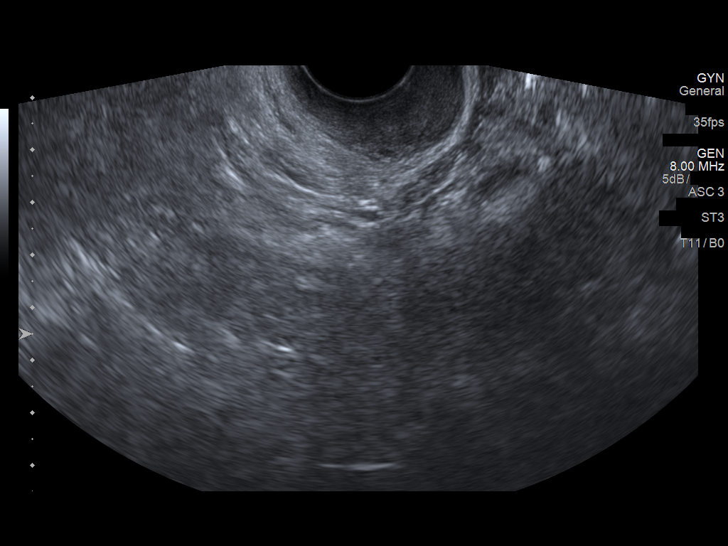
[im 54/65]
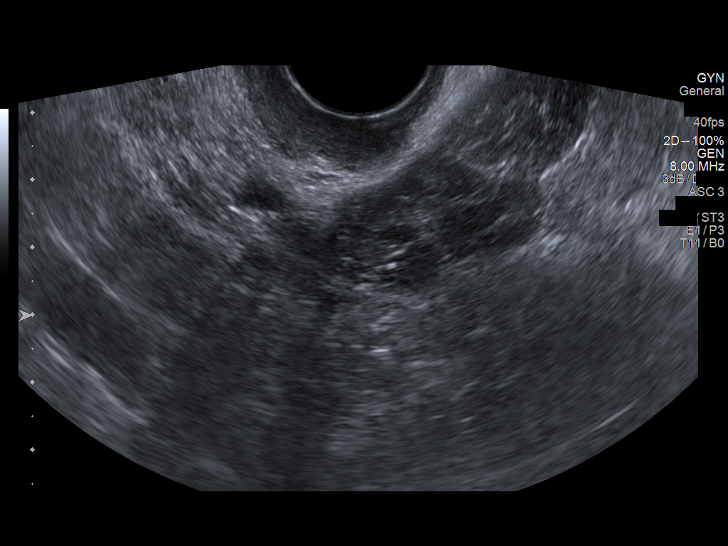
[im 59/65]
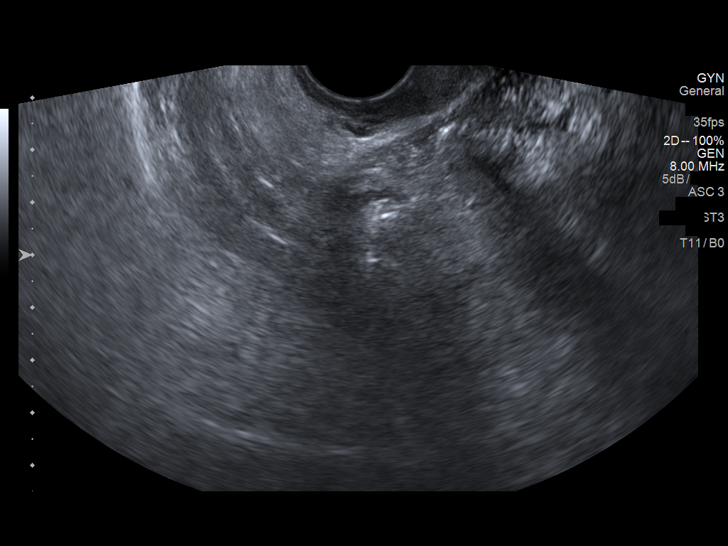
[im 65/65]
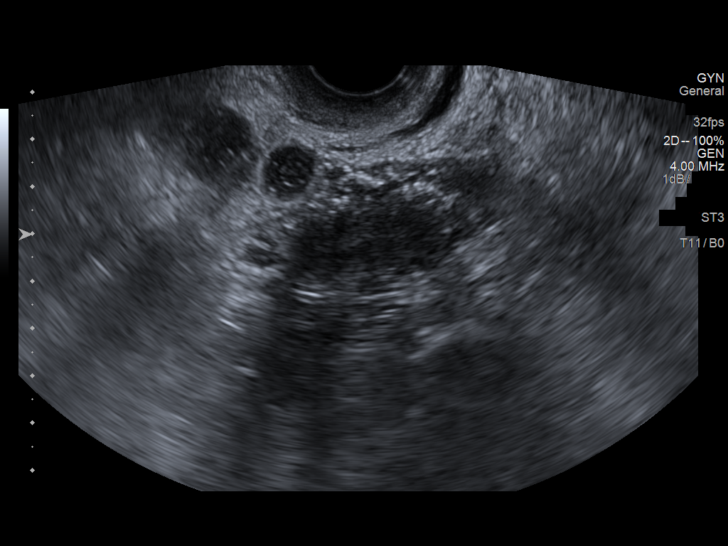

[13 of 25 positions shown; findings below may reference images not displayed]

FINDINGS: Uterus

Measurements: 7.4 x 2.7 x 4.2 cm. A calcified uterine fibroid is
seen in the left anterior corpus which measures 1.4 cm. Diffuse
myometrial vascular calcification is noted, but no other definite
fibroids are identified.

Endometrium

Thickness: 2 mm.  No focal abnormality visualized.

Right ovary

Measurements: Not visualized, however no adnexal mass identified.

Left ovary

Measurements: Not visualized, however no adnexal mass identified.

Other findings

No abnormal free fluid.
IMPRESSION: 1.4 cm calcified uterine fibroid.

Nonvisualization of ovaries, however no adnexal mass identified.

## 2018-12-19 IMAGING — US US ABDOMEN COMPLETE
1 series · 13 of 25 positions shown · non-contrast
Comparison: CT, 07/01/2017

CLINICAL DATA: Left lower quadrant abdominal pain. Reported injury
last year.

EXAM:
ABDOMEN ULTRASOUND COMPLETE

[Series 1: us abdomen complete · 0.17mm/px · 13 of 118 slices shown]
[im 1/118]
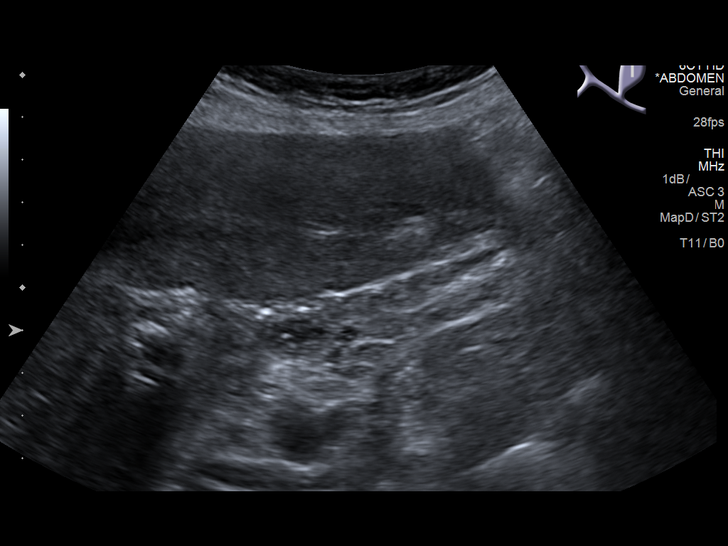
[im 10/118]
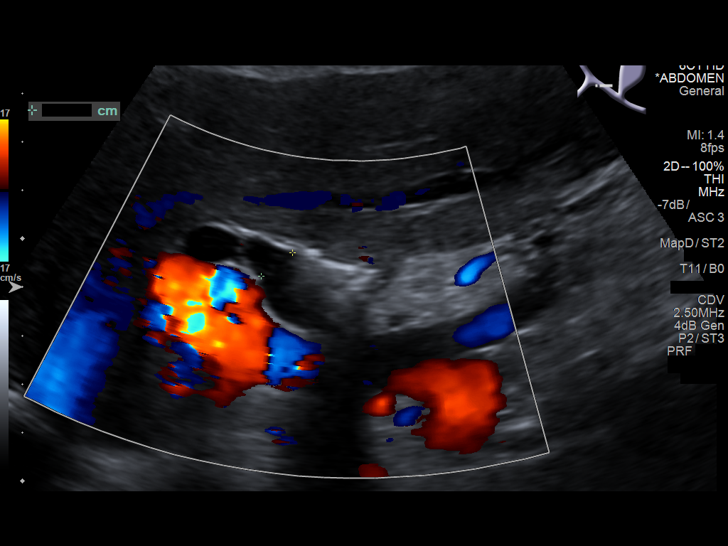
[im 20/118]
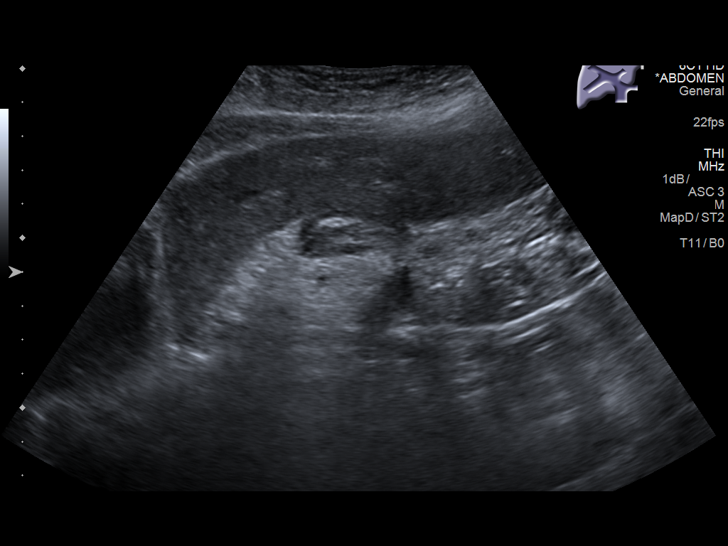
[im 30/118]
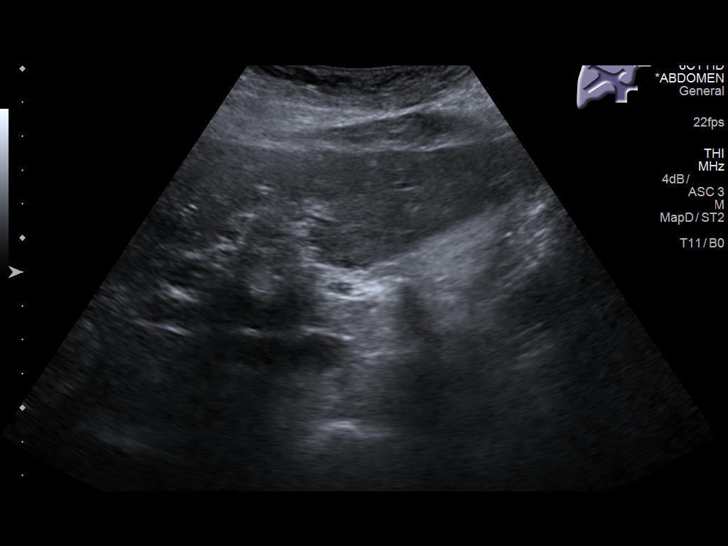
[im 40/118]
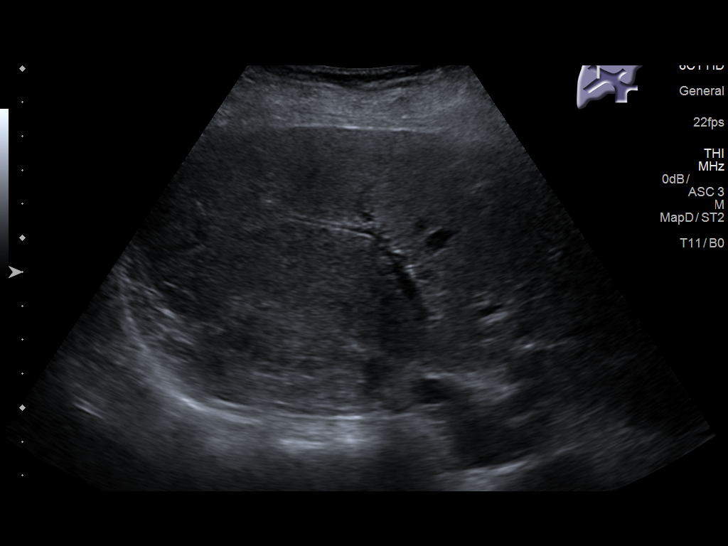
[im 49/118]
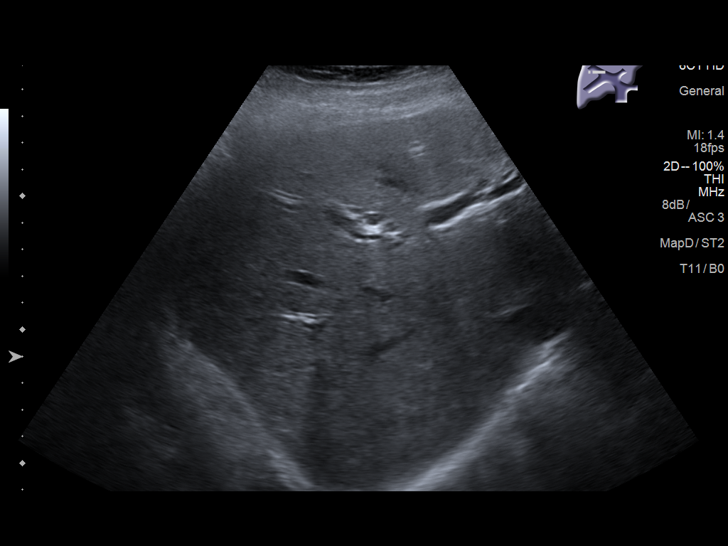
[im 59/118]
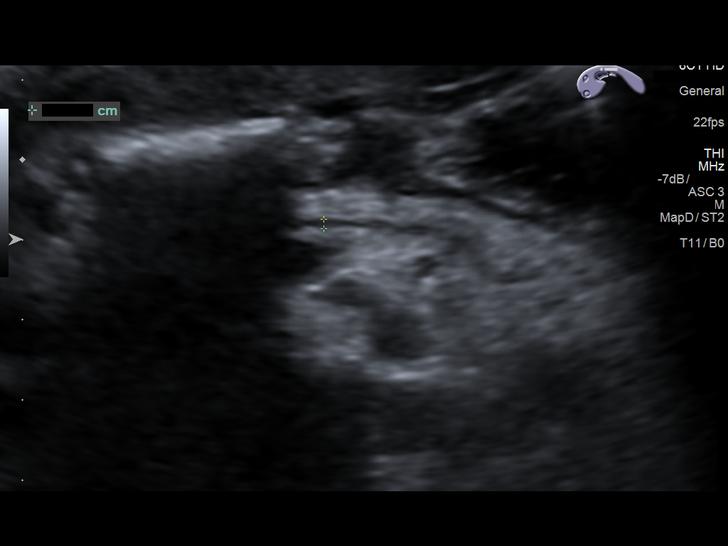
[im 69/118]
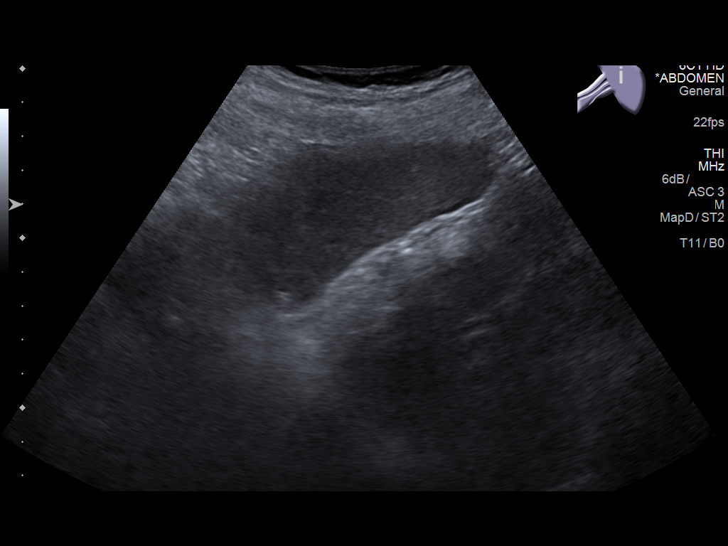
[im 79/118]
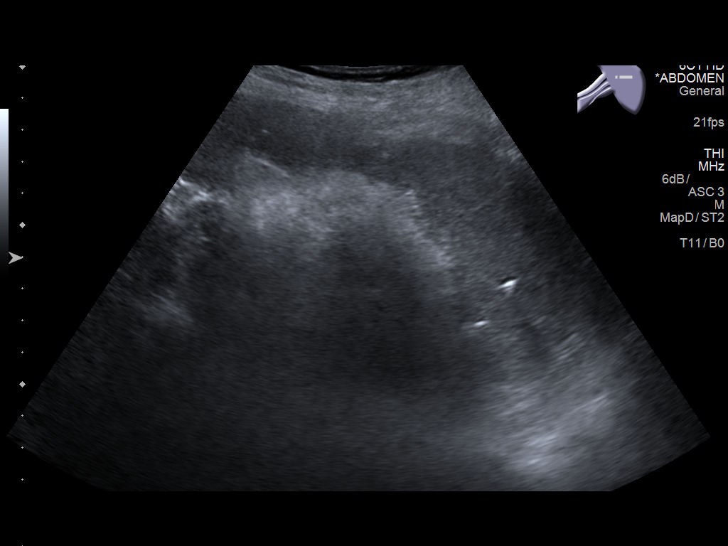
[im 88/118]
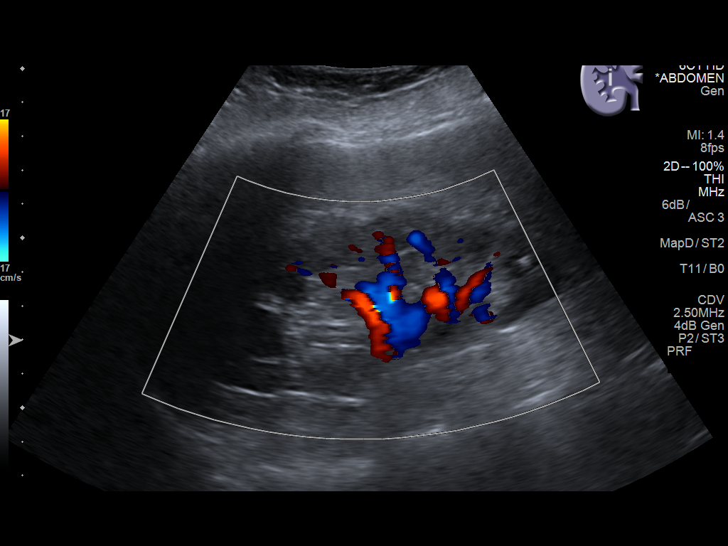
[im 98/118]
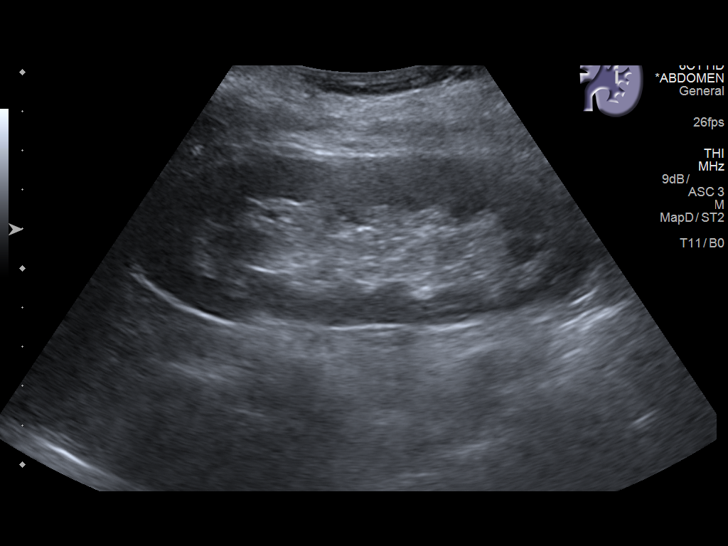
[im 108/118]
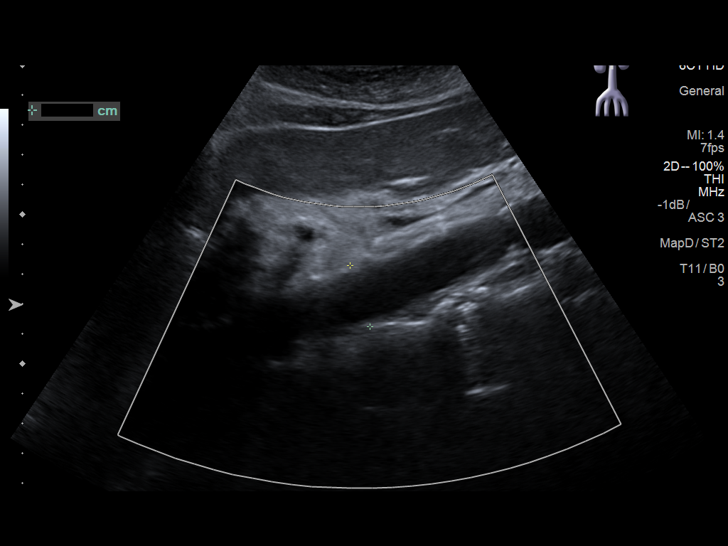
[im 118/118]
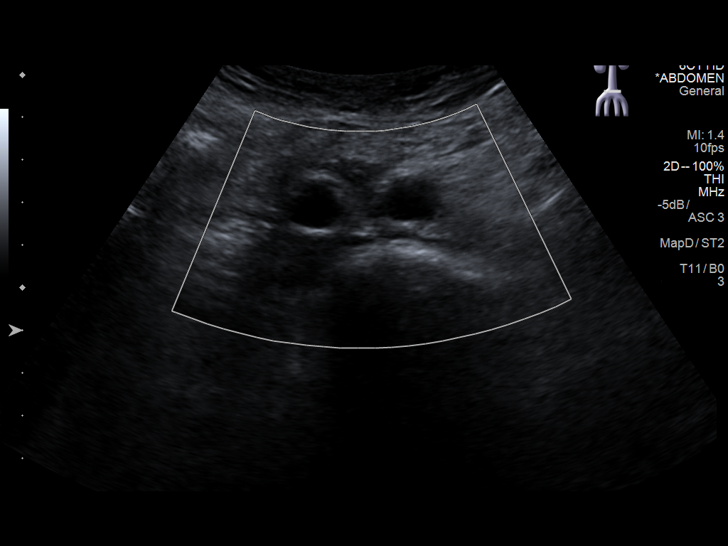

[13 of 25 positions shown; findings below may reference images not displayed]

FINDINGS: Gallbladder: Surgically absent.

Common bile duct: Diameter: 8 mm

Liver: Normal in size. Small echogenic foci consistent with
calcifications from healed granulomatous disease. Normal parenchymal
echogenicity. Portal vein is patent on color Doppler imaging with
normal direction of blood flow towards the liver.

IVC: No abnormality visualized.

Pancreas: Visualized portion unremarkable.

Spleen: Top-normal in size measuring 10.6 cm. Multiple echogenic
foci consistent with healed granuloma. No masses.

Right Kidney: Length: 11.5 cm. Echogenicity within normal limits. No
mass or hydronephrosis visualized.

Left Kidney: Length: 11.6 cm. Echogenicity within normal limits. No
mass or hydronephrosis visualized.

Abdominal aorta: Atherosclerotic irregularity and mild ectasia. No
aneurysm.

Other findings: None.
IMPRESSION: 1. No acute findings.
2. Status post cholecystectomy. Mild chronic dilation of the common
bile duct 8 mm.
3. Liver and spleen echogenic foci consistent with calcified, healed
granulomatous disease, stable from prior CT.
4. Aortic atherosclerosis.

## 2019-01-01 NOTE — Telephone Encounter (Signed)
Pt has been scheduled for sleep study at [facility] on 01/26/19 at 1330.  Attempted to contact and inform of sleep study. No answer, voice mailbox was not set up. Will continue efforts.

## 2019-01-02 NOTE — Telephone Encounter (Signed)
I would have her keep her appointment as I may be off for several weeks in September

## 2019-01-02 NOTE — Telephone Encounter (Signed)
Spoke with pt, informed of sleep study appt on 01/26/19. Pt asking if she needs to move f/u appt out further since currently scheduled appt is 01/20/19. Will consult with Dr. Bettina Gavia.

## 2019-01-02 NOTE — Telephone Encounter (Signed)
Patient informed to keep her follow up appointment as scheduled on 01/20/2019 at 1:35 pm in the [facility] office to see Dr. Bettina Gavia. Patient is agreeable and verbalized understanding. No further questions.

## 2019-01-05 ENCOUNTER — Inpatient Hospital Stay: Payer: Medicare Other | Attending: Hematology & Oncology

## 2019-01-05 ENCOUNTER — Other Ambulatory Visit: Payer: Self-pay

## 2019-01-05 ENCOUNTER — Inpatient Hospital Stay (HOSPITAL_BASED_OUTPATIENT_CLINIC_OR_DEPARTMENT_OTHER): Payer: Medicare Other | Admitting: Family

## 2019-01-05 ENCOUNTER — Telehealth: Payer: Self-pay

## 2019-01-05 ENCOUNTER — Encounter: Payer: Self-pay | Admitting: Family

## 2019-01-05 VITALS — BP 140/64 | HR 66 | Temp 97.5°F | Resp 20 | Ht 66.0 in | Wt 162.0 lb

## 2019-01-05 DIAGNOSIS — D751 Secondary polycythemia: Secondary | ICD-10-CM

## 2019-01-05 DIAGNOSIS — C50011 Malignant neoplasm of nipple and areola, right female breast: Secondary | ICD-10-CM

## 2019-01-05 DIAGNOSIS — D45 Polycythemia vera: Secondary | ICD-10-CM | POA: Diagnosis not present

## 2019-01-05 DIAGNOSIS — I2510 Atherosclerotic heart disease of native coronary artery without angina pectoris: Secondary | ICD-10-CM | POA: Diagnosis not present

## 2019-01-05 DIAGNOSIS — Z853 Personal history of malignant neoplasm of breast: Secondary | ICD-10-CM | POA: Insufficient documentation

## 2019-01-05 DIAGNOSIS — G4700 Insomnia, unspecified: Secondary | ICD-10-CM

## 2019-01-05 DIAGNOSIS — D500 Iron deficiency anemia secondary to blood loss (chronic): Secondary | ICD-10-CM | POA: Diagnosis not present

## 2019-01-05 LAB — CBC WITH DIFFERENTIAL (CANCER CENTER ONLY)
Abs Immature Granulocytes: 0.03 10*3/uL (ref 0.00–0.07)
Basophils Absolute: 0.1 10*3/uL (ref 0.0–0.1)
Basophils Relative: 1 %
Eosinophils Absolute: 0.2 10*3/uL (ref 0.0–0.5)
Eosinophils Relative: 3 %
HCT: 41.1 % (ref 36.0–46.0)
Hemoglobin: 12.2 g/dL (ref 12.0–15.0)
Immature Granulocytes: 1 %
Lymphocytes Relative: 16 %
Lymphs Abs: 1 10*3/uL (ref 0.7–4.0)
MCH: 24.1 pg — ABNORMAL LOW (ref 26.0–34.0)
MCHC: 29.7 g/dL — ABNORMAL LOW (ref 30.0–36.0)
MCV: 81.1 fL (ref 80.0–100.0)
Monocytes Absolute: 0.5 10*3/uL (ref 0.1–1.0)
Monocytes Relative: 8 %
Neutro Abs: 4.4 10*3/uL (ref 1.7–7.7)
Neutrophils Relative %: 71 %
Platelet Count: 449 10*3/uL — ABNORMAL HIGH (ref 150–400)
RBC: 5.07 MIL/uL (ref 3.87–5.11)
RDW: 15.6 % — ABNORMAL HIGH (ref 11.5–15.5)
WBC Count: 6.1 10*3/uL (ref 4.0–10.5)
nRBC: 0 % (ref 0.0–0.2)

## 2019-01-05 LAB — CMP (CANCER CENTER ONLY)
ALT: 8 U/L (ref 0–44)
AST: 16 U/L (ref 15–41)
Albumin: 4.2 g/dL (ref 3.5–5.0)
Alkaline Phosphatase: 58 U/L (ref 38–126)
Anion gap: 7 (ref 5–15)
BUN: 6 mg/dL — ABNORMAL LOW (ref 8–23)
CO2: 31 mmol/L (ref 22–32)
Calcium: 9.4 mg/dL (ref 8.9–10.3)
Chloride: 98 mmol/L (ref 98–111)
Creatinine: 0.89 mg/dL (ref 0.44–1.00)
GFR, Est AFR Am: >60 mL/min (ref >60)
GFR, Estimated: 58 mL/min — ABNORMAL LOW (ref >60)
Glucose, Bld: 106 mg/dL — ABNORMAL HIGH (ref 70–99)
Potassium: 4.5 mmol/L (ref 3.5–5.1)
Sodium: 136 mmol/L (ref 135–145)
Total Bilirubin: 1.3 mg/dL — ABNORMAL HIGH (ref 0.3–1.2)
Total Protein: 7.2 g/dL (ref 6.5–8.1)

## 2019-01-05 NOTE — Telephone Encounter (Signed)
Returned call to Kia, left voice message.

## 2019-01-05 NOTE — Addendum Note (Signed)
Addended by: Polly Cobia A on: 01/05/2019 12:05 PM   Modules accepted: Orders

## 2019-01-05 NOTE — Progress Notes (Signed)
Hematology and Oncology Follow Up Visit  Alezay Harpold [id number] [birth year] 85 y.o. 01/05/2019   Principle Diagnosis:  Polycythemia vera - JAK2 positive Stage I (T1bN0M0 ) infiltrating duct carcinoma the left breast   Past Therapy: Femara 2.5 mg by mouth daily - stopped 08/2015  Current Therapy:   Phlebotomy to maintain hematocrit below 45% Hydrea 500 mg by mouth every other day   Interim History:  Ms. Potteiger is here today for follow-up. She is doing well and has no complaints at this time.  She states that she had pain in the left arm 2 weeks ago that last about 2 hours. She took 3 baby aspirin and states that the pain resolved. She had been doing some working/lifting in her yard and thinks she may have strained a muscle in her arm. She has had no other episodes or symptoms.  She states that she is taking her Hydrea 500 mg PO every other day as prescribed.  No fever, chills, n/v, cough, rash, dizziness, SOB, chest pain, palpitations, abdominal pain or changes in bowel or bladder habits.  No swelling, tenderness, numbness or tingling in her extremities.  She has a good appetite and is staying well hydrated. Her weight is stable.  No episodes of unusual or excessive bleeding. She does bruise easily on her arms and legs on aspirin.   ECOG Performance Status: 1 - Symptomatic but completely ambulatory  Medications:  Allergies as of 01/05/2019      Reactions   Penicillins Hives, Rash, Other (See Comments)   Has patient had a PCN reaction causing immediate rash, facial/tongue/throat swelling, SOB or lightheadedness with hypotension: Yes Has patient had a PCN reaction causing severe rash involving mucus membranes or skin necrosis: yes Has patient had a PCN reaction that required hospitalization: No Has patient had a PCN reaction occurring within the last 10 years: no If all of the above answers are "NO", then may proceed with Cephalosporin use. Other Reaction: OTHER REACTION    Valsartan Itching, Other (See Comments)   Atorvastatin Diarrhea   Carvedilol    "teeth hurt when I took it" "teeth hurt when I took it"   Ezetimibe Nausea Only   Fluvastatin Sodium Other (See Comments)   "Can't remember"   Magnesium Hydroxide Other (See Comments)   REACTION: triggers HAs   Meloxicam Other (See Comments)   Pt seeing auro's / spots.     Omeprazole Magnesium Other (See Comments)   auras   Pneumovax [pneumococcal Polysaccharide Vaccine] Other (See Comments)   Local reaction   Quinapril Hcl Other (See Comments)    "feelings of tiredness"   Simvastatin Diarrhea   Topamax [topiramate] Itching   Vit D-vit E-safflower Oil Other (See Comments)   Headaches      Medication List       Accurate as of January 05, 2019  2:20 PM. If you have any questions, ask your nurse or doctor.        acetaminophen-codeine 300-30 MG tablet Commonly known as: TYLENOL #3 Take 1 tablet by mouth every 8 (eight) hours as needed. For migraine   aspirin 81 MG tablet Take 81 mg by mouth daily.   bismuth subsalicylate [id number] suspension Commonly known as: PEPTO BISMOL Take 30 mLs by mouth every other day.   hydrochlorothiazide 25 MG tablet Commonly known as: HYDRODIURIL Take 1 tablet (25 mg total) by mouth daily as needed (swelling in the legs ).   hydroxyurea 500 MG capsule Commonly known as: HYDREA TAKE 1 CAPSULE  BY MOUTH EVERY OTHER DAY TAKE WITH FOOD TO MINIMIZE SIDE EFFECT   loratadine 10 MG tablet Commonly known as: CLARITIN Take 10 mg by mouth daily as needed for allergies.   LORazepam 0.5 MG tablet Commonly known as: ATIVAN TAKE 1/2 TABLET BY MOUTH EVERY MORNING AND EVERY AFTERNOON. TAKE 2 TABLETS BY MOUTH AT BEDTIME AS NEEDED FOR ANXIETY.   metoprolol tartrate 50 MG tablet Commonly known as: LOPRESSOR Take 1 tablet (50 mg total) by mouth 2 (two) times daily.   mometasone 0.1 % cream Commonly known as: Elocon Apply 1 application topically daily.   omeprazole  20 MG capsule Commonly known as: PRILOSEC Take 20 mg by mouth daily.       Allergies:  Allergies  Allergen Reactions  . Penicillins Hives, Rash and Other (See Comments)    Has patient had a PCN reaction causing immediate rash, facial/tongue/throat swelling, SOB or lightheadedness with hypotension: Yes Has patient had a PCN reaction causing severe rash involving mucus membranes or skin necrosis: yes Has patient had a PCN reaction that required hospitalization: No Has patient had a PCN reaction occurring within the last 10 years: no If all of the above answers are "NO", then may proceed with Cephalosporin use.  Other Reaction: OTHER REACTION  . Valsartan Itching and Other (See Comments)  . Atorvastatin Diarrhea  . Carvedilol     "teeth hurt when I took it" "teeth hurt when I took it"  . Ezetimibe Nausea Only  . Fluvastatin Sodium Other (See Comments)    "Can't remember"  . Magnesium Hydroxide Other (See Comments)    REACTION: triggers HAs  . Meloxicam Other (See Comments)    Pt seeing auro's / spots.    . Omeprazole Magnesium Other (See Comments)    auras  . Pneumovax [Pneumococcal Polysaccharide Vaccine] Other (See Comments)    Local reaction  . Quinapril Hcl Other (See Comments)     "feelings of tiredness"  . Simvastatin Diarrhea  . Topamax [Topiramate] Itching  . Vit D-Vit E-Safflower Oil Other (See Comments)    Headaches    Past Medical History, Surgical history, Social history, and Family History were reviewed and updated.  Review of Systems: All other 10 point review of systems is negative.   Physical Exam:  vitals were not taken for this visit.   Wt Readings from Last 3 Encounters:  12/04/18 161 lb (73 kg)  12/01/18 162 lb 12.8 oz (73.8 kg)  11/07/18 160 lb (72.6 kg)    Ocular: Sclerae unicteric, pupils equal, round and reactive to light Ear-nose-throat: Oropharynx clear, dentition fair Lymphatic: No cervical or supraclavicular adenopathy Lungs no rales  or rhonchi, good excursion bilaterally Heart regular rate and rhythm, no murmur appreciated Abd soft, nontender, positive bowel sounds, no liver or spleen tip palpated on exam, no fluid wave  MSK no focal spinal tenderness, no joint edema Neuro: non-focal, well-oriented, appropriate affect Breasts: Deferred   Lab Results  Component Value Date   WBC 6.1 01/05/2019   HGB 12.2 01/05/2019   HCT 41.1 01/05/2019   MCV 81.1 01/05/2019   PLT 449 (H) 01/05/2019   Lab Results  Component Value Date   FERRITIN 34 12/04/2018   IRON 22 (L) 12/04/2018   TIBC 474 (H) 12/04/2018   UIBC 451 (H) 12/04/2018   IRONPCTSAT 5 (L) 12/04/2018   Lab Results  Component Value Date   RETICCTPCT 0.8 02/02/2015   RBC 5.07 01/05/2019   RETICCTABS 41.4 02/02/2015   No results found for: KPAFRELGTCHN,  LAMBDASER, KAPLAMBRATIO No results found for: Kandis Cocking, IGMSERUM No results found for: Odetta Pink, SPEI   Chemistry      Component Value Date/Time   NA 136 01/05/2019 1327   NA 142 03/25/2017 1457   NA 139 04/10/2016 0855   K 4.5 01/05/2019 1327   K 4.1 03/25/2017 1457   K 4.2 04/10/2016 0855   CL 98 01/05/2019 1327   CL 96 (L) 03/25/2017 1457   CO2 31 01/05/2019 1327   CO2 32 03/25/2017 1457   CO2 29 04/10/2016 0855   BUN 6 (L) 01/05/2019 1327   BUN 7 03/25/2017 1457   BUN 10.0 04/10/2016 0855   CREATININE 0.89 01/05/2019 1327   CREATININE 1.0 03/25/2017 1457   CREATININE 0.8 04/10/2016 0855      Component Value Date/Time   CALCIUM 9.4 01/05/2019 1327   CALCIUM 9.8 03/25/2017 1457   CALCIUM 9.5 04/10/2016 0855   ALKPHOS 58 01/05/2019 1327   ALKPHOS 86 (H) 03/25/2017 1457   ALKPHOS 85 04/10/2016 0855   AST 16 01/05/2019 1327   AST 19 04/10/2016 0855   ALT 8 01/05/2019 1327   ALT 15 03/25/2017 1457   ALT 13 04/10/2016 0855   BILITOT 1.3 (H) 01/05/2019 1327   BILITOT 1.26 (H) 04/10/2016 0855       Impression and Plan: Ms.  Petrone is a very pleasant 85 yo caucasian female with polycythemia and history of stage I infiltrating ductal carcinoma of the left breast with lumpectomy in 2013.  Hct is 41.1%. No phlebotomy needed this visit.  She is tolerating Hydrea nicely and will continue her same regimen.  We will plan to see her back in another 6 weeks.  She will contact our office with any questions or concerns. We can certainly see her sooner if needed.   [name], NP 8/24/20202:20 PM

## 2019-01-05 NOTE — Telephone Encounter (Signed)
Kia from sleep center returned your call, please call her at [phone]

## 2019-01-06 LAB — IRON AND TIBC
Iron: 21 ug/dL — ABNORMAL LOW (ref 41–142)
Saturation Ratios: 5 % — ABNORMAL LOW (ref 21–57)
TIBC: 451 ug/dL — ABNORMAL HIGH (ref 236–444)
UIBC: 430 ug/dL — ABNORMAL HIGH (ref 120–384)

## 2019-01-06 LAB — FERRITIN: Ferritin: 35 ng/mL (ref 11–307)

## 2019-01-06 NOTE — Addendum Note (Signed)
Addended by: Polly Cobia A on: 01/06/2019 11:23 AM   Modules accepted: Orders

## 2019-01-06 NOTE — Telephone Encounter (Addendum)
Confirmed Home Sleep Study appt at [facility] 01/26/19 at 1330.

## 2019-01-20 ENCOUNTER — Ambulatory Visit: Payer: Medicare Other | Admitting: Cardiology

## 2019-01-23 ENCOUNTER — Other Ambulatory Visit: Payer: Self-pay

## 2019-01-26 ENCOUNTER — Encounter: Payer: Self-pay | Admitting: Internal Medicine

## 2019-01-26 ENCOUNTER — Ambulatory Visit (INDEPENDENT_AMBULATORY_CARE_PROVIDER_SITE_OTHER): Payer: Medicare Other | Admitting: Internal Medicine

## 2019-01-26 ENCOUNTER — Other Ambulatory Visit: Payer: Self-pay

## 2019-01-26 ENCOUNTER — Encounter (HOSPITAL_BASED_OUTPATIENT_CLINIC_OR_DEPARTMENT_OTHER): Payer: Medicare Other | Admitting: Cardiovascular Disease

## 2019-01-26 VITALS — BP 130/78 | HR 63 | Temp 97.2°F | Resp 16 | Ht 66.0 in | Wt 162.1 lb

## 2019-01-26 DIAGNOSIS — Z23 Encounter for immunization: Secondary | ICD-10-CM | POA: Diagnosis not present

## 2019-01-26 DIAGNOSIS — F341 Dysthymic disorder: Secondary | ICD-10-CM | POA: Diagnosis not present

## 2019-01-26 DIAGNOSIS — D751 Secondary polycythemia: Secondary | ICD-10-CM

## 2019-01-26 DIAGNOSIS — I2510 Atherosclerotic heart disease of native coronary artery without angina pectoris: Secondary | ICD-10-CM | POA: Diagnosis not present

## 2019-01-26 DIAGNOSIS — I10 Essential (primary) hypertension: Secondary | ICD-10-CM | POA: Diagnosis not present

## 2019-01-26 MED ORDER — ACETAMINOPHEN-CODEINE #3 300-30 MG PO TABS: 1.0000 | ORAL_TABLET | Freq: Three times a day (TID) | ORAL | 0 refills | Status: DC | PRN

## 2019-01-26 NOTE — Progress Notes (Signed)
Subjective:    Patient ID: [name], female    DOB: 26-Oct-1929, 85 y.o.   MRN: [id number]  DOS:  01/26/2019 Type of visit - description: Routine office visit In general feeling well. Notes from hematology reviewed Note from cardiology reviewed    Review of Systems No new concerns. She did notice a lesion near the right eye and is concerned  Past Medical History:  Diagnosis Date  . CAD (coronary artery disease)    mild nonobstructive disease on cath in 2003  . Cancer ([place])   . Cardiomyopathy    Probable Takotsubo, severe CP w/ normal cath in 1994. Severe CP in 2003 w/ widespread T wave inversions on ECG. Cath w/ minimal coronary disease but LV-gram showed periapical severe hypokinesis and basilar hyperkinesia (EF 40%). Last echo in 4/09 confirmed full LV functional recovery with EF 60%, no regional wall motion abnormalities, mild to moderate LVH.  Marland Kitchen CVA (cerebral infarction)   . Depression with anxiety 03-29-11   lost husband 3'09  . Diverticulitis   . DVT (deep venous thrombosis) ([place])    after venous ablation, R leg  . E. coli gastroenteritis 03-29-11   8'10  . Fracture 09/10/07   L2, status post vertebroplasty of L2 performed by IR  . GERD (gastroesophageal reflux disease)   . Headache(784.0)    migraines  . Hemorrhoids   . Hiatal hernia 03-29-11   no nerve problems  . Hyperlipidemia   . Hyperplastic colon polyp 06/2001  . Hypertension   . Iron deficiency anemia due to chronic blood loss 12/30/2017  . OA (osteoarthritis) of knee 03-29-11   w/ bilateral knee pain-not a problem now  . OSA (obstructive sleep apnea) 03-29-11   no cpap used, not a problem now.  . Osteoporosis   . Otalgia of both ears    Dr. Simeon Craft  . Polycythemia   . Stroke [facility]) 03-29-11   CVA x2 -last 10'12-?TIA(visual problems)  . Varicose vein     Past Surgical History:  Procedure Laterality Date  . APPENDECTOMY    . BACK SURGERY    . BREAST BIOPSY    . BREAST CYST EXCISION    .  BREAST LUMPECTOMY Left 2013   left stage I left breast cancer  . CHOLECYSTECTOMY  04/09/2011   Procedure: LAPAROSCOPIC CHOLECYSTECTOMY WITH INTRAOPERATIVE CHOLANGIOGRAM;  Surgeon: Odis Hollingshead, MD;  Location: [facility];  Service: General;  Laterality: N/A;  . Dental Extraction     L maxillary molar  . KYPHOSIS SURGERY  08/2007   cement used  . OVARIAN CYST SURGERY     left  . TONSILLECTOMY    . TOTAL KNEE ARTHROPLASTY  06/2010   left  . TUBAL LIGATION      Social History   Socioeconomic History  . Marital status: Widowed    Spouse name: Not on file  . Number of children: 3  . Years of education: Not on file  . Highest education level: Not on file  Occupational History  . Occupation: n/a  Social Needs  . Financial resource strain: Not on file  . Food insecurity    Worry: Not on file    Inability: Not on file  . Transportation needs    Medical: Not on file    Non-medical: Not on file  Tobacco Use  . Smoking status: Former Smoker    Packs/day: 1.00    Years: 37.00    Pack years: 37.00    Types: Cigarettes  Start date: 06/26/1951    Quit date: 05/14/1988    Years since quitting: 30.7  . Smokeless tobacco: Never Used  . Tobacco comment: quit 25 years ago  Substance and Sexual Activity  . Alcohol use: Yes    Alcohol/week: 1.0 standard drinks    Types: 1 Glasses of wine per week    Comment: occasional/social  . Drug use: No  . Sexual activity: Not Currently  Lifestyle  . Physical activity    Days per week: Not on file    Minutes per session: Not on file  . Stress: Not on file  Relationships  . Social Herbalist on phone: Not on file    Gets together: Not on file    Attends religious service: Not on file    Active member of club or organization: Not on file    Attends meetings of clubs or organizations: Not on file    Relationship status: Not on file  . Intimate partner violence    Fear of current or ex partner: Not on file    Emotionally abused:  Not on file    Physically abused: Not on file    Forced sexual activity: Not on file  Other Topics Concern  . Not on file  Social History Narrative   Lost husband 07/29/07-    Lives in [place] in a town house by herself, has 3 daughters, lost Mickel Baas [phone])   Early Chars lives in [place], Margaretha [place] ( [place])     Still drives                Allergies as of 01/26/2019      Reactions   Penicillins Hives, Rash, Other (See Comments)   Has patient had a PCN reaction causing immediate rash, facial/tongue/throat swelling, SOB or lightheadedness with hypotension: Yes Has patient had a PCN reaction causing severe rash involving mucus membranes or skin necrosis: yes Has patient had a PCN reaction that required hospitalization: No Has patient had a PCN reaction occurring within the last 10 years: no If all of the above answers are "NO", then may proceed with Cephalosporin use. Other Reaction: OTHER REACTION   Valsartan Itching, Other (See Comments)   Atorvastatin Diarrhea   Carvedilol Other (See Comments)   "teeth hurt when I took it"   Ezetimibe Nausea Only   Fluvastatin Sodium Other (See Comments)   "Can't remember"   Magnesium Hydroxide Other (See Comments)   REACTION: triggers HAs   Meloxicam Other (See Comments)   Pt seeing auro's / spots.     Omeprazole Magnesium Other (See Comments)   auras   Pneumovax [pneumococcal Polysaccharide Vaccine] Other (See Comments)   Local reaction   Quinapril Hcl Other (See Comments)    "feelings of tiredness"   Simvastatin Diarrhea   Topamax [topiramate] Itching   Vit D-vit E-safflower Oil Other (See Comments)   Headaches      Medication List       Accurate as of January 26, 2019 11:59 PM. If you have any questions, ask your nurse or doctor.        acetaminophen-codeine 300-30 MG tablet Commonly known as: TYLENOL #3 Take 1 tablet by mouth every 8 (eight) hours as needed. For migraine   aspirin 81 MG tablet Take 81 mg by mouth  daily.   bismuth subsalicylate 99991111 [id number] suspension Commonly known as: PEPTO BISMOL Take 30 mLs by mouth every other day.   hydrochlorothiazide 25 MG tablet Commonly known as:  HYDRODIURIL Take 1 tablet (25 mg total) by mouth daily as needed (swelling in the legs ).   hydroxyurea 500 MG capsule Commonly known as: HYDREA TAKE 1 CAPSULE BY MOUTH EVERY OTHER DAY TAKE WITH FOOD TO MINIMIZE SIDE EFFECT   LORazepam 0.5 MG tablet Commonly known as: ATIVAN TAKE 1/2 TABLET BY MOUTH EVERY MORNING AND EVERY AFTERNOON. TAKE 2 TABLETS BY MOUTH AT BEDTIME AS NEEDED FOR ANXIETY.   metoprolol tartrate 50 MG tablet Commonly known as: LOPRESSOR Take 1 tablet (50 mg total) by mouth 2 (two) times daily.   mometasone 0.1 % cream Commonly known as: Elocon Apply 1 application topically daily.   omeprazole 20 MG capsule Commonly known as: PRILOSEC Take 20 mg by mouth daily.           Objective:   Physical Exam Eyes:     BP 130/78 (BP Location: Left Arm, Patient Position: Sitting, Cuff Size: Small)   Pulse 63   Temp (!) 97.2 F (36.2 C) (Temporal)   Resp 16   Ht 5\' 6"  (1.676 m)   Wt 162 lb 2 oz (73.5 kg)   SpO2 100%   BMI 26.17 kg/m  General:   Well developed, NAD, BMI noted. HEENT:  Normocephalic . Face symmetric, atraumatic Lungs:  CTA B Normal respiratory effort, no intercostal retractions, no accessory muscle use. Heart: RRR,  no murmur.  No pretibial edema bilaterally  Skin: Not pale. Not jaundice Neurologic:  alert & oriented X3.  Speech normal, gait appropriate for age and unassisted Psych--  Cognition and judgment appear intact.  Cooperative with normal attention span and concentration.  Behavior appropriate. No anxious or depressed appearing.      Assessment    Assessment  HTN Dyslipidemia: Intolerant to most medicines including Zetia, Pravachol, Niaspan Depression, anxiety , insomnia - on ativan bid (03-2015 citalopram cause drowsiness, 04-2015 prozac  diarrhea) Hem-Onc: POLYCYTEMIA: sees hem-onc H/o breast ca  MSK: ---DJD :  Tylenol #3, uses rarely ---Osteoporosis:  T score -2.8 (02-2013): declined treatment 06-2017 ---L2 fractures, s/p vertebral plasty 2009 Migraine headaches - Tylenol #3 (rarely)  GI: GERD- intolerant to prevacid, visual disturbances HH, diverticulitis, Colon polyps  h/o "functional " LLQ abd pain CV: ---Cardiomyopathy Probable Takotsubo cardiomyopathy: CP w/ normal cath in 1994.   Severe CP  2003 with widespread T wave inversions on ECG.  Cath with minimal coronary disease but LV-gram showed periapical severe hypokinesis and basilar hyperkinesia (EF 40%) >>> ECHO 4/09 confirmed full LV functional recovery with EF 60%, mild LVH. 2012: Nl EF per echo, (-) Lexiscan 05/21/18 ECHO Nl/stable ---Stroke / TIA 2012 Sleep apnea 2012, , not on CPAP H/oDVT, right leg after venous ablation Raynaud phenomena, DX 05/2018  PLAN HTN: BP seems to be doing very well.  Continue HCTZ, metoprolol.  Last BMP satisfactory. Depression, anxiety, insomnia: Doing well, on Ativan. Skin lesion, right eye Looks benign, recommend observation. History of cardiomyopathy: Saw cardiology 12/01/2018 due to S OB, they suspected OSA and a home sleep test was ordered Polycythemia: Closely follow-up by hematology, last visit 01/05/2019, she tolerates Hydrea. Flu shot today RTC 6 months

## 2019-01-26 NOTE — Progress Notes (Signed)
Pre visit review using our clinic review tool, if applicable. No additional management support is needed unless otherwise documented below in the visit note. 

## 2019-01-26 NOTE — Patient Instructions (Addendum)
Please schedule Medicare Wellness with Glenard Haring.    GO TO THE FRONT DESK Schedule your next appointment   for checkup in 4 to 6 months

## 2019-01-27 NOTE — Assessment & Plan Note (Signed)
HTN: BP seems to be doing very well.  Continue HCTZ, metoprolol.  Last BMP satisfactory. Depression, anxiety, insomnia: Doing well, on Ativan. Skin lesion, right eye Looks benign, recommend observation. History of cardiomyopathy: Saw cardiology 12/01/2018 due to S OB, they suspected OSA and a home sleep test was ordered Polycythemia: Closely follow-up by hematology, last visit 01/05/2019, she tolerates Hydrea. Flu shot today RTC 6 months

## 2019-02-09 IMAGING — CR DG HIP (WITH OR WITHOUT PELVIS) 2-3V*L*
3 series · 3 of 3 positions shown · non-contrast
Comparison: None.

CLINICAL DATA: Left hip pain since fall earlier today.

EXAM:
DG HIP (WITH OR WITHOUT PELVIS) 2-3V LEFT

[x pelvis]
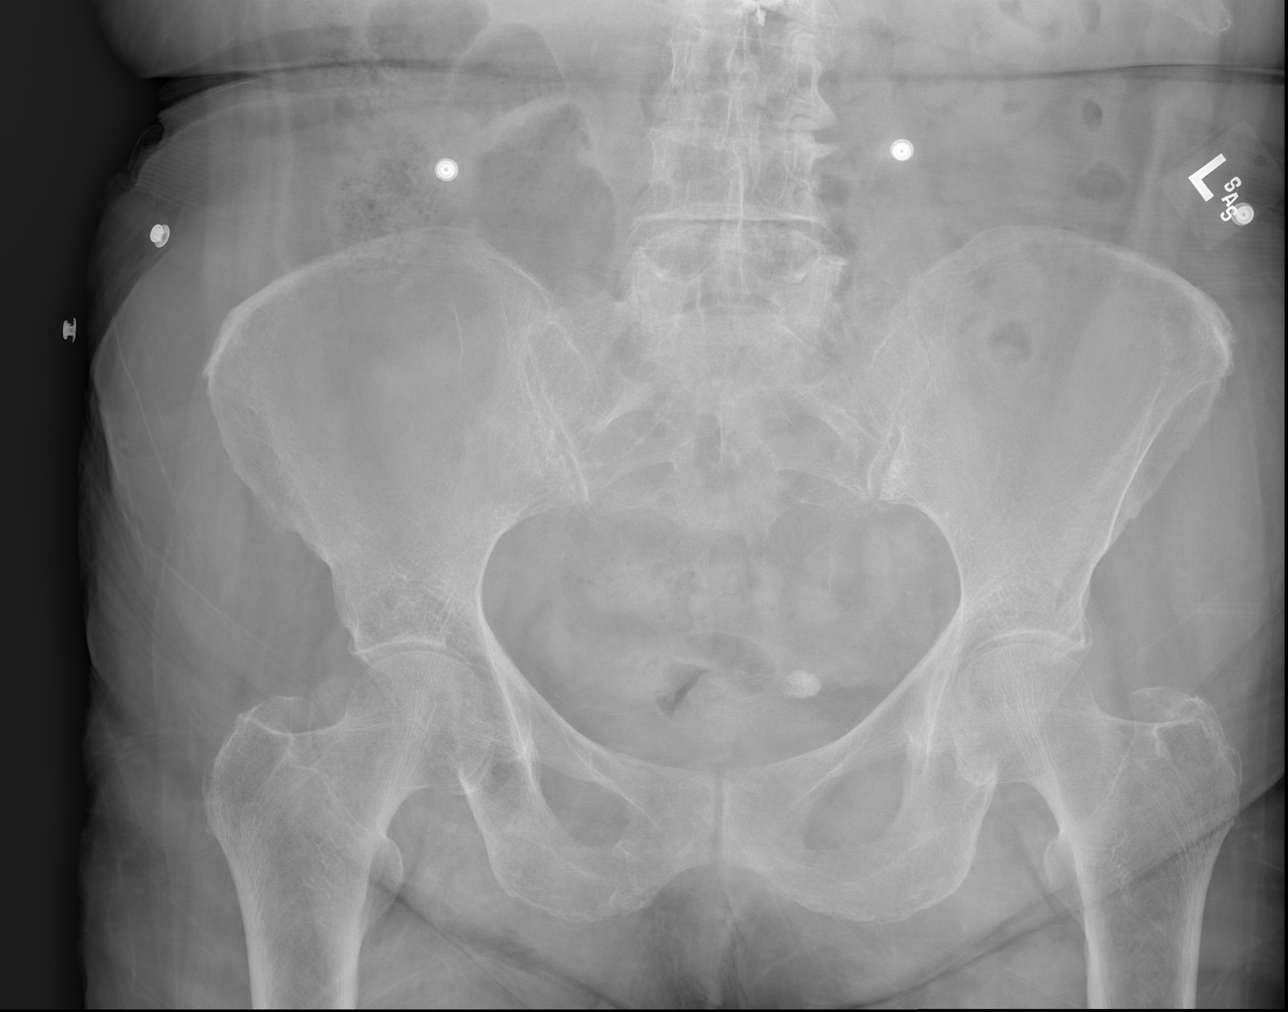

[x hip ap left]
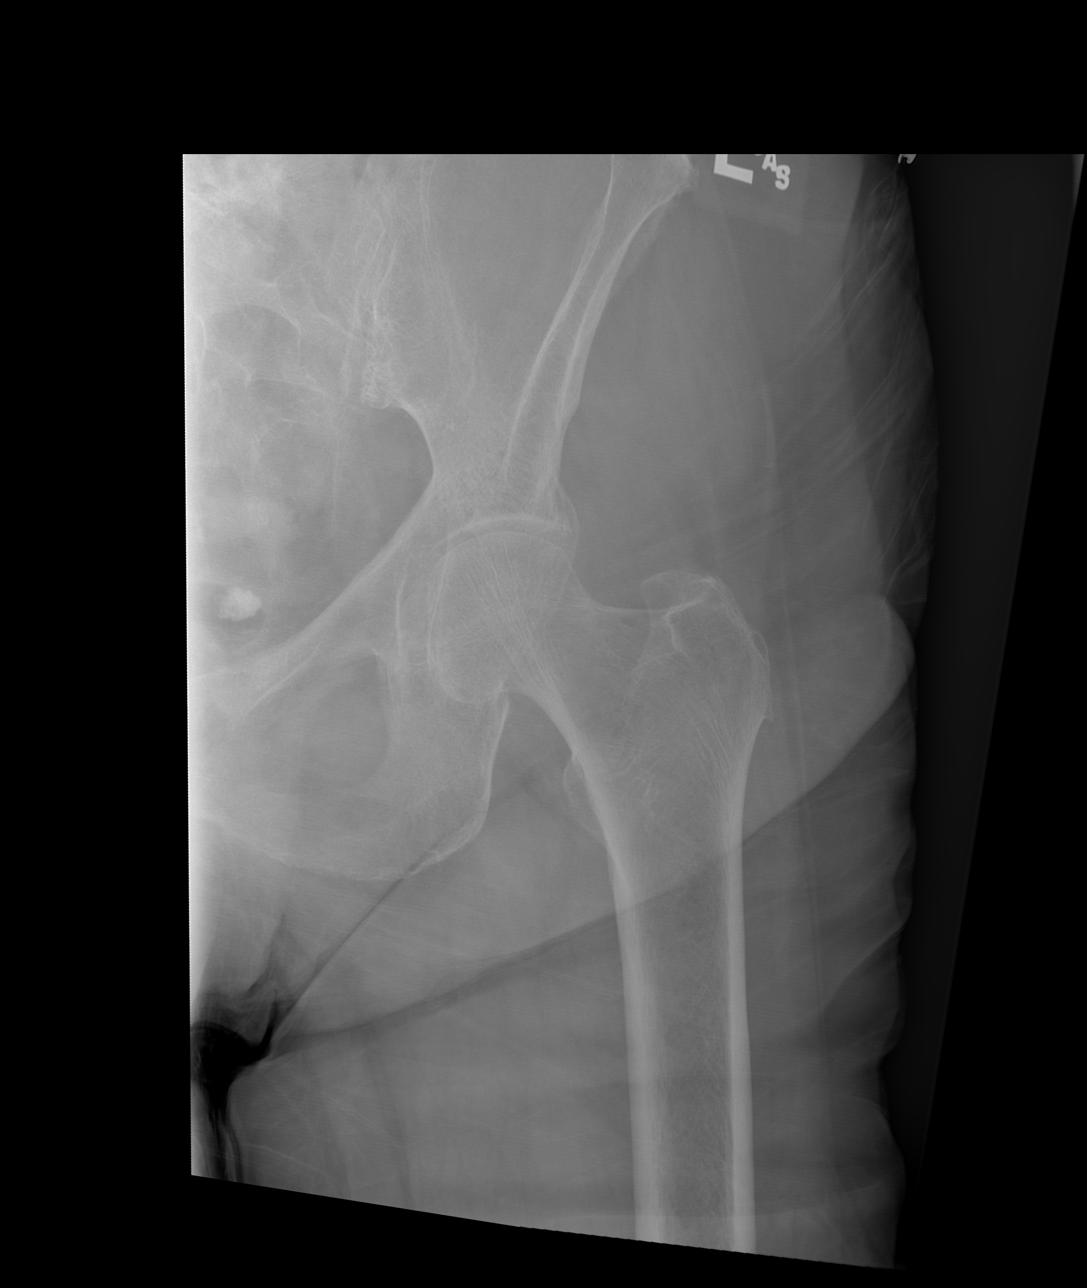

[x hip lat left]
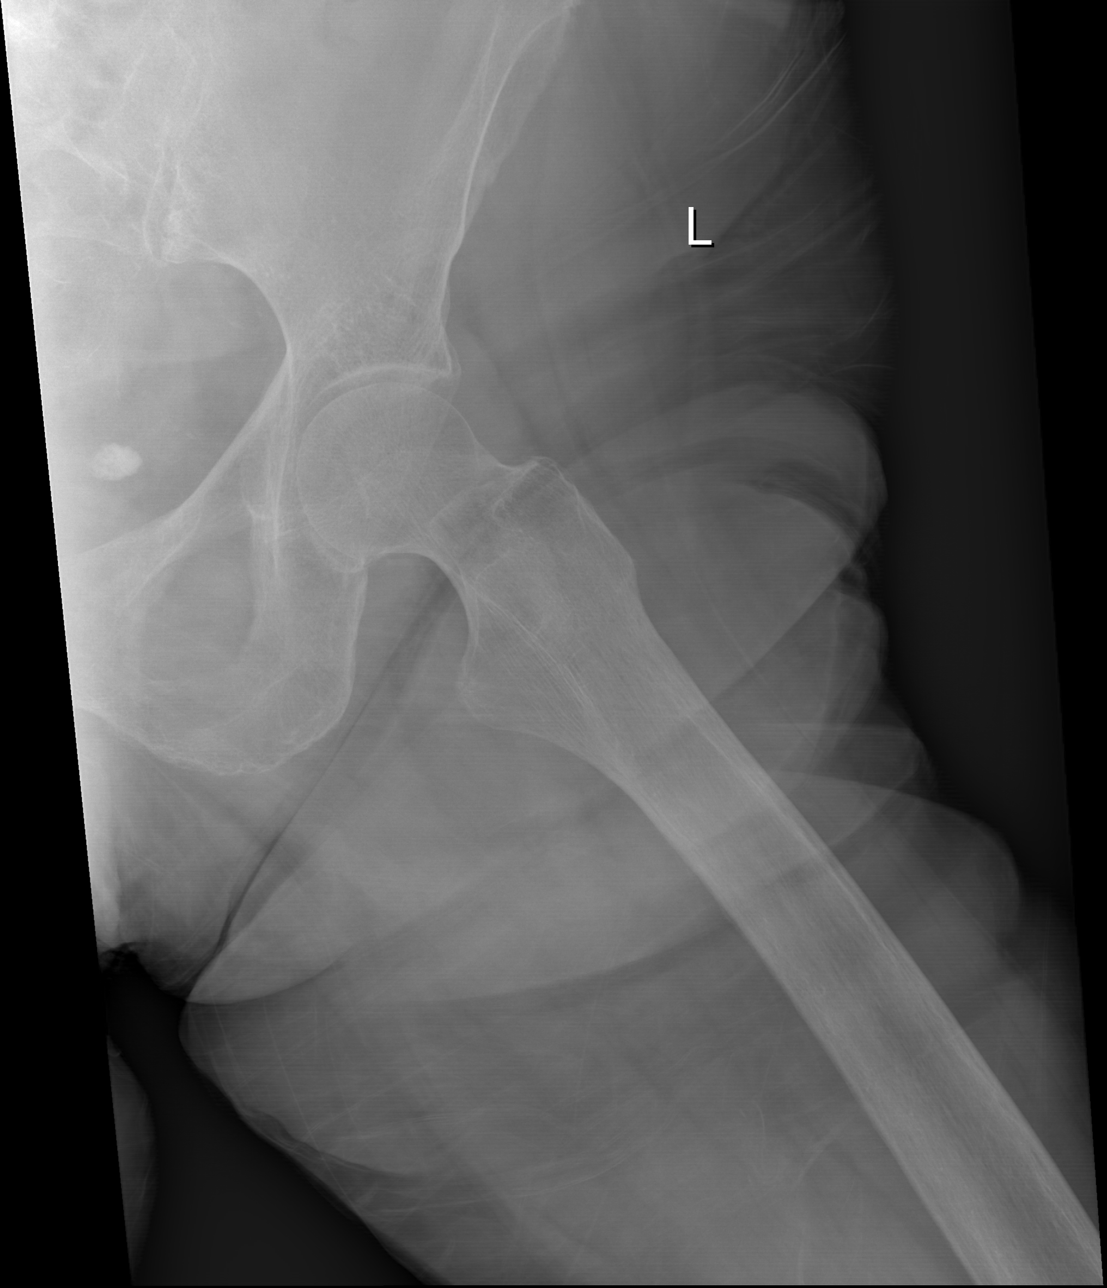

[3 of 3 positions shown; findings below may reference images not displayed]

FINDINGS: Frontal pelvis shows no fracture. Bones are diffusely demineralized.
SI joints and symphysis pubis are unremarkable. AP and frog-leg
lateral views of the left hip are unremarkable.
IMPRESSION: Negative.

## 2019-02-09 IMAGING — CR DG SHOULDER 2+V*L*
2 series · 2 of 2 positions shown · non-contrast
Comparison: None.

CLINICAL DATA: Left shoulder pain following fall today. Initial
encounter.

EXAM:
LEFT SHOULDER - 2+ VIEW

[x shoulder ap left (1 of 2)]
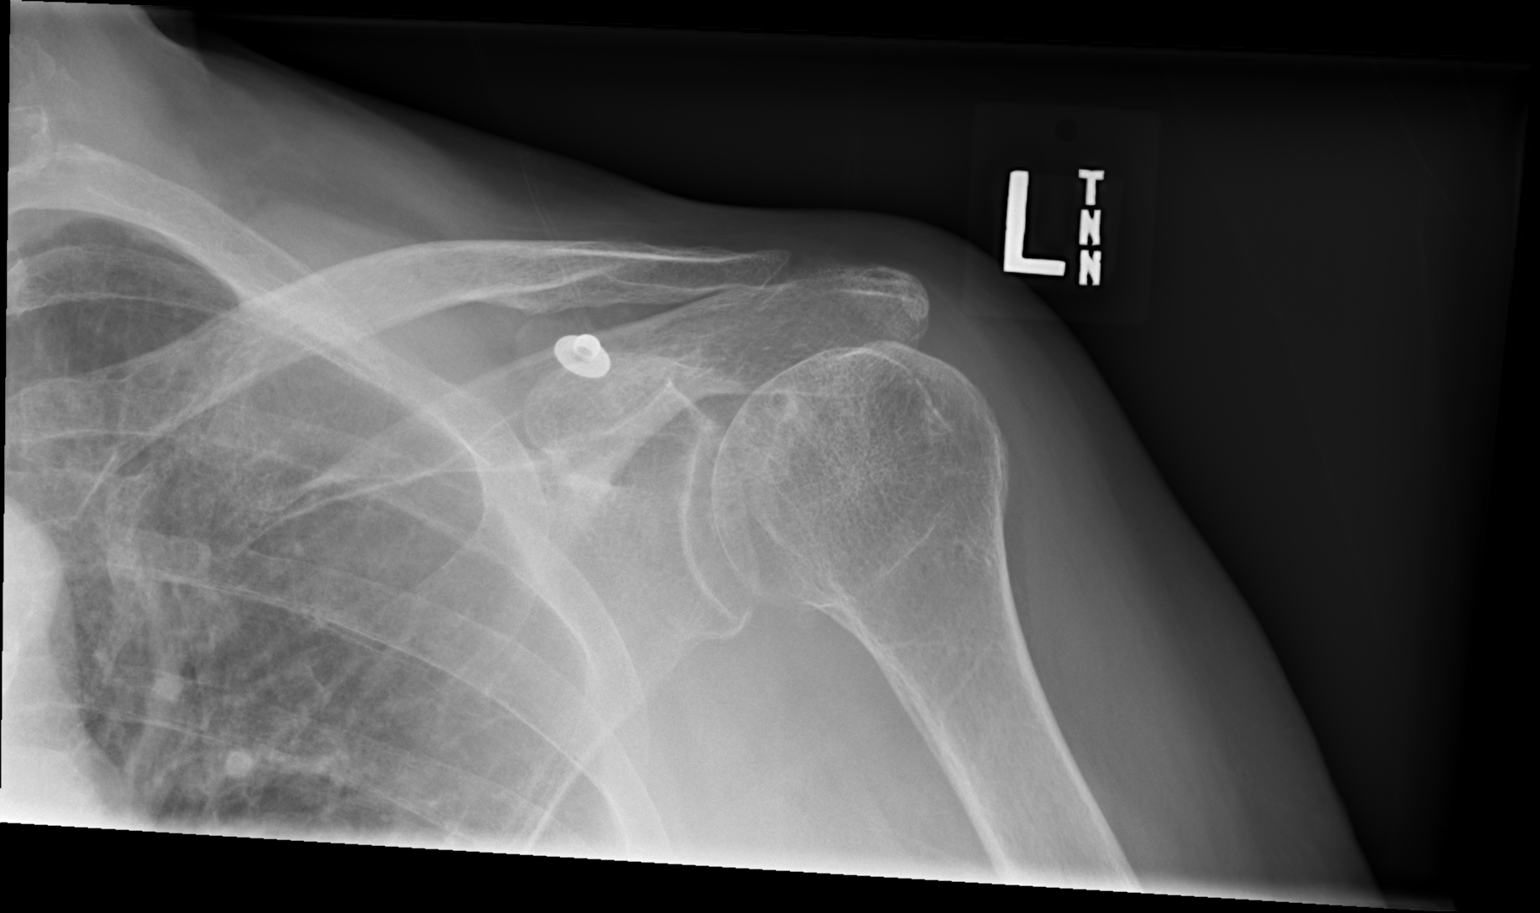

[x shoulder ap left (2 of 2)]
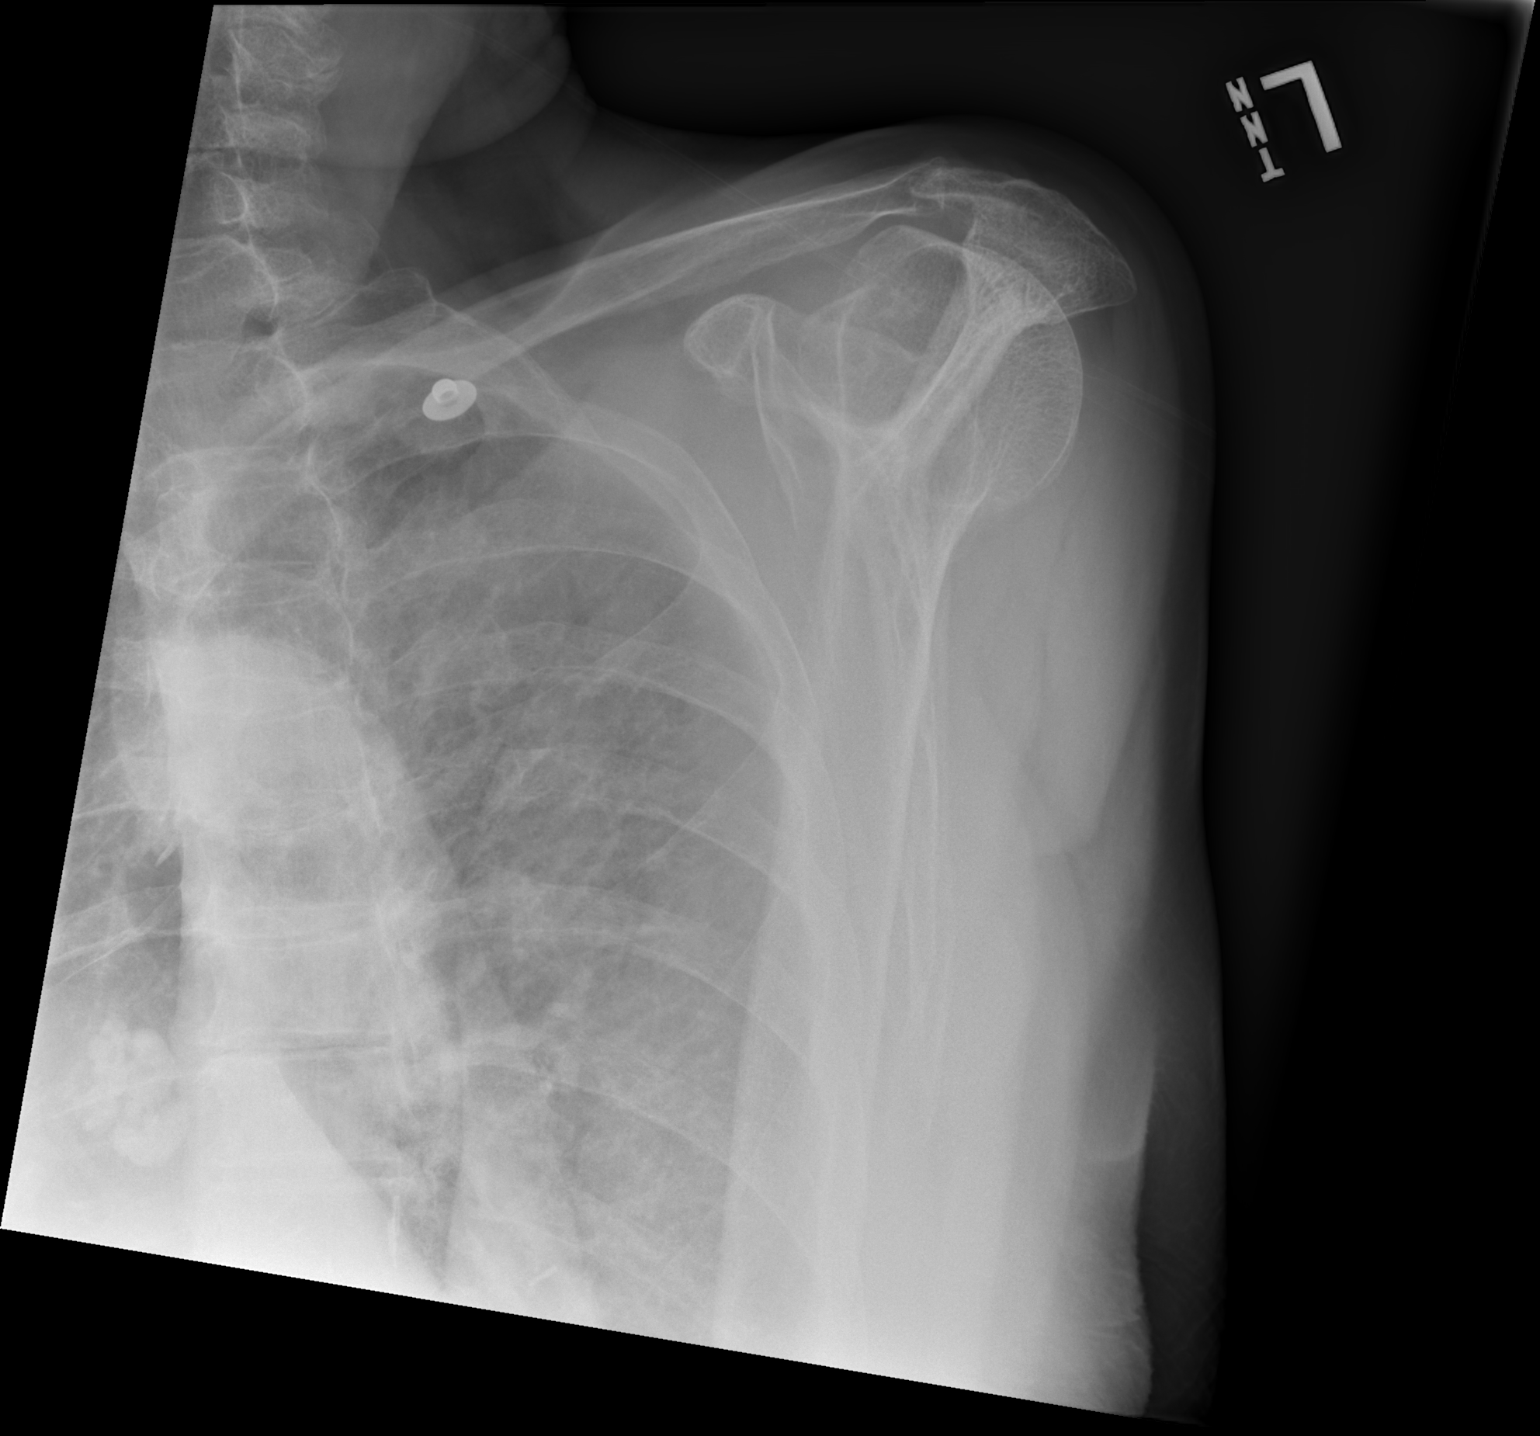

[2 of 2 positions shown; findings below may reference images not displayed]

FINDINGS: There is no evidence of fracture or dislocation. There is no
evidence of arthropathy or other focal bone abnormality. Soft
tissues are unremarkable.
IMPRESSION: Negative.

## 2019-02-09 IMAGING — CT CT HIP*L* W/O CM
3 of 4 series · 11 of 46 positions shown, 18 images · non-contrast
Comparison: 07/26/2016 radiographs. 10/19/2015 and prior abdominal/
pelvic CTs.

CLINICAL DATA: Acute left hip pain following fall. Left leg
shortening. Initial encounter.

EXAM:
CT OF THE LEFT HIP WITHOUT CONTRAST
TECHNIQUE: Multidetector CT imaging of the left hip was performed according to
the standard protocol. Multiplanar CT image reconstructions were
also generated.

[Series 5: hip st · axial · 0.46mm/px · z∈[+842,+987]mm · 7 of 39 slices shown, 12 images]
[im 5/39  soft-tissue]
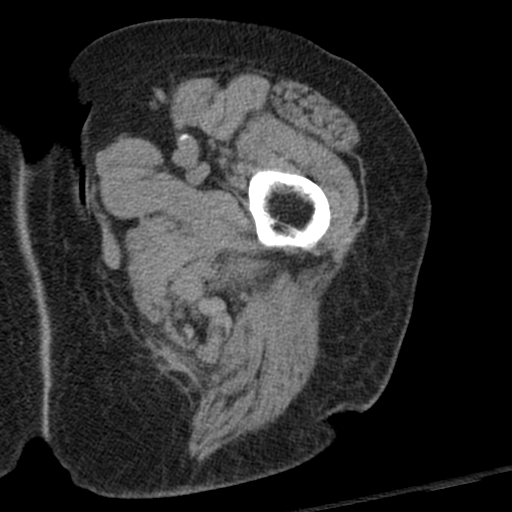
[im 5/39  bone]
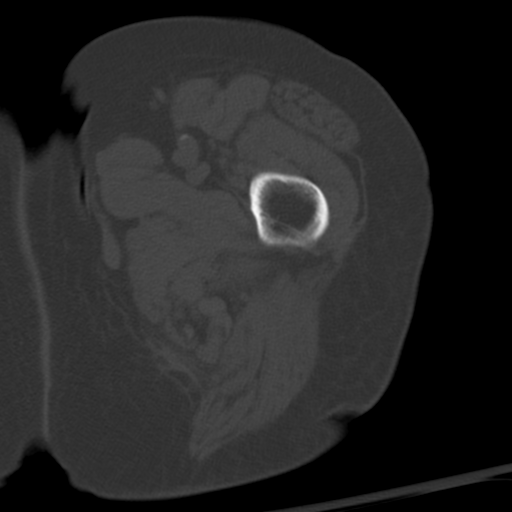
[im 10/39  soft-tissue]
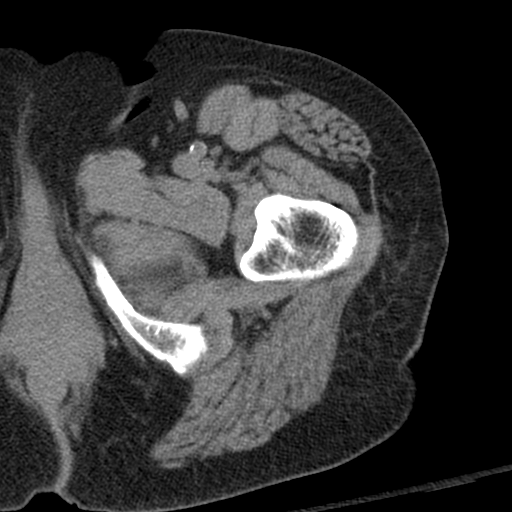
[im 15/39  soft-tissue]
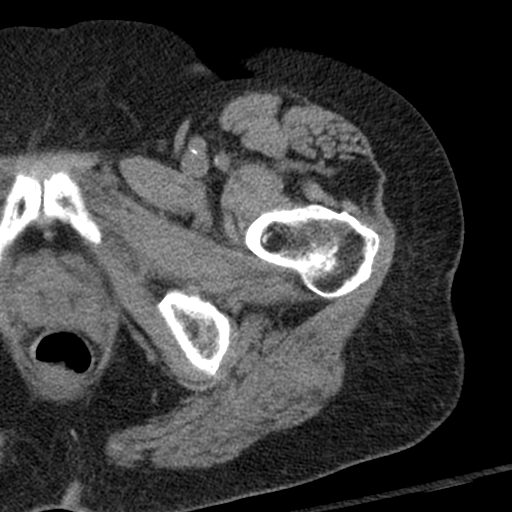
[im 20/39  soft-tissue]
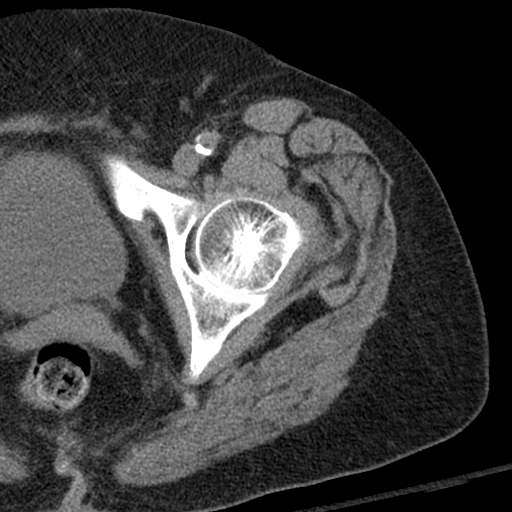
[im 20/39  lung]
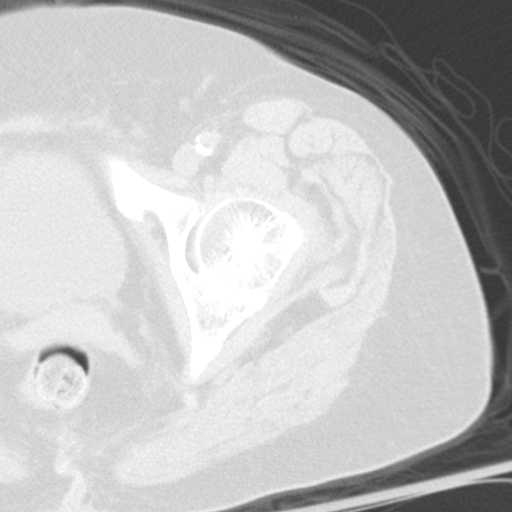
[im 24/39  soft-tissue]
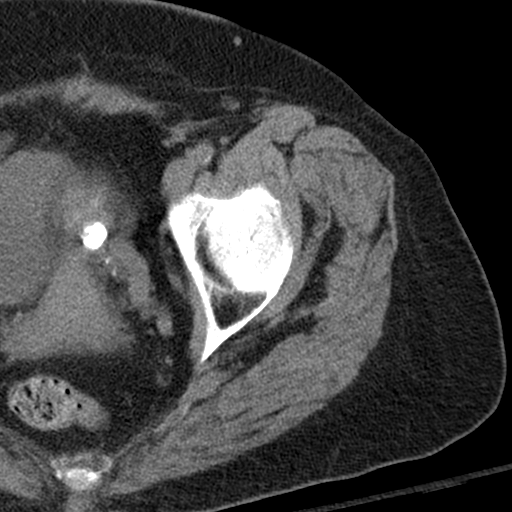
[im 24/39  lung]
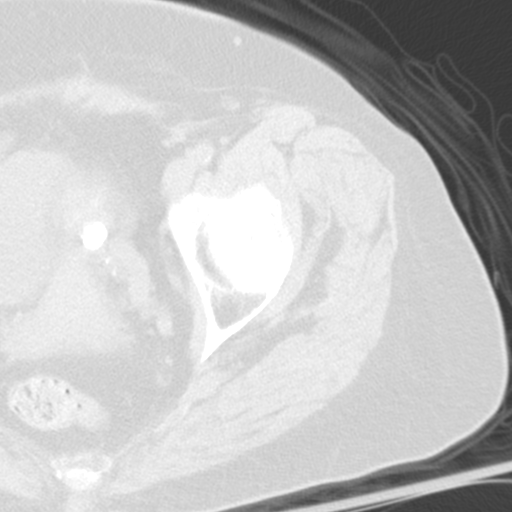
[im 29/39  soft-tissue]
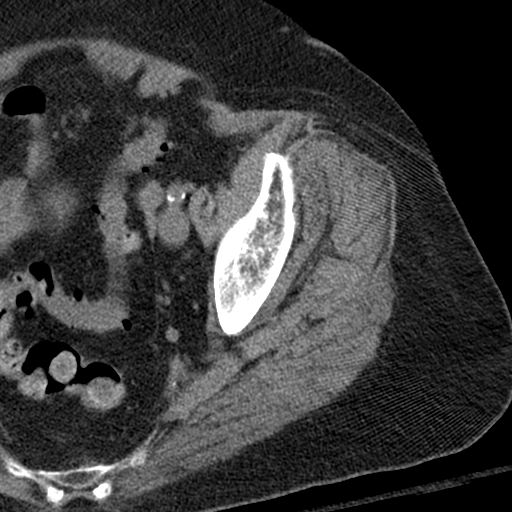
[im 29/39  lung]
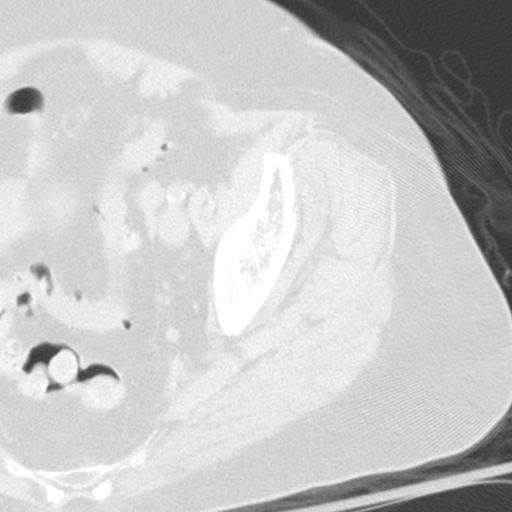
[im 34/39  soft-tissue]
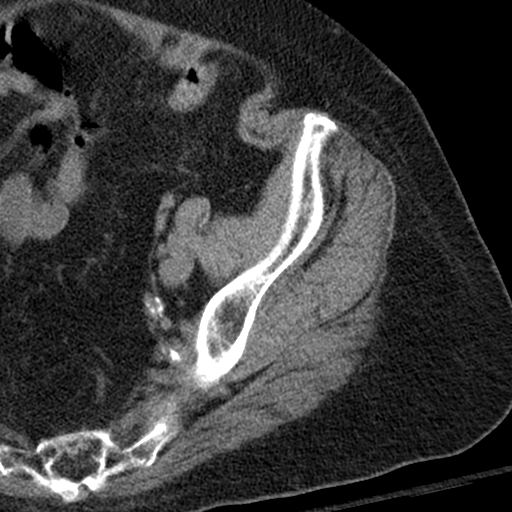
[im 34/39  lung]
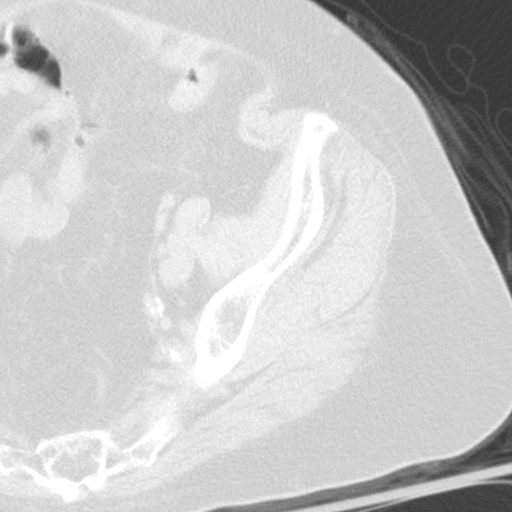

[Series 7: coronal bone · coronal · 0.46mm/px · 3 of 119 slices shown, 4 images]
[im 40/119  soft-tissue]
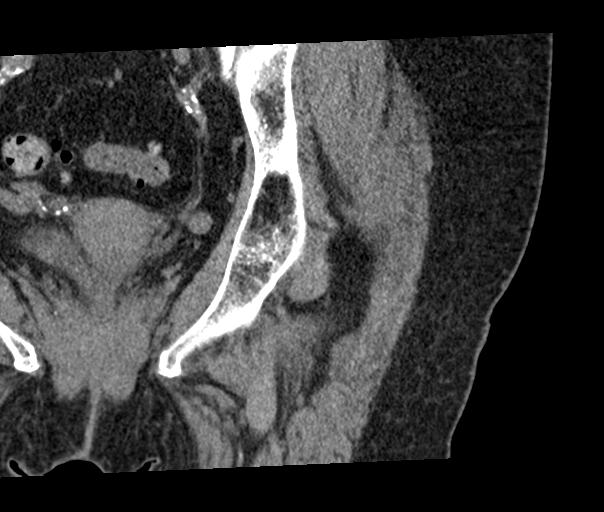
[im 53/119  soft-tissue]
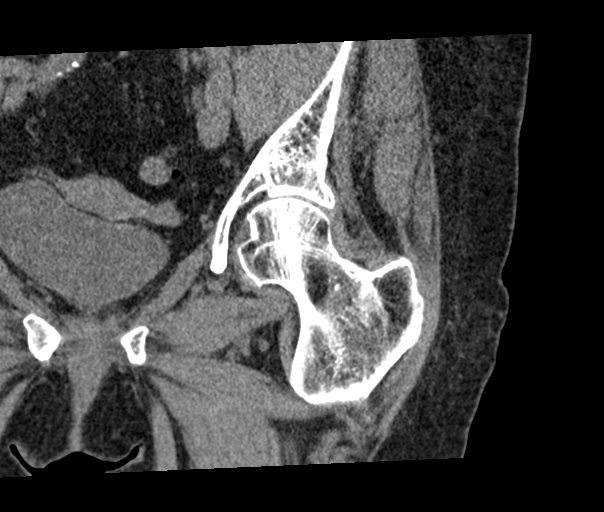
[im 53/119  bone]
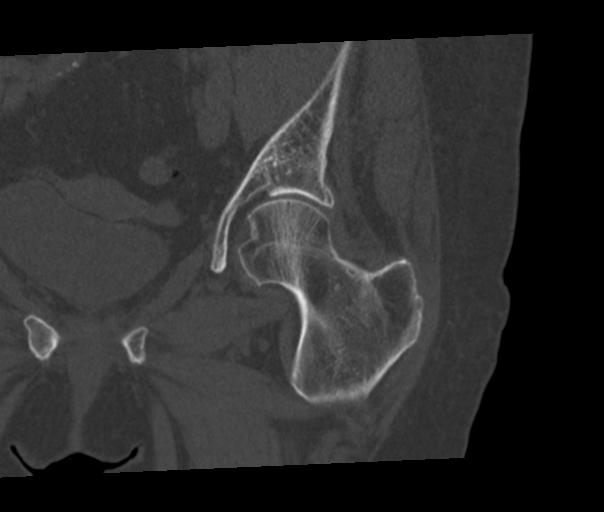
[im 66/119  soft-tissue]
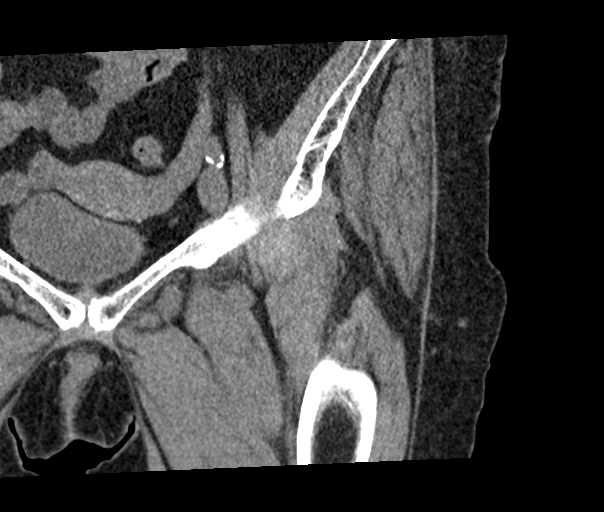

[Series 8: sagittal bone · sagittal · 0.45mm/px · 1 of 128 slices shown, 2 images]
[im 43/128  soft-tissue]
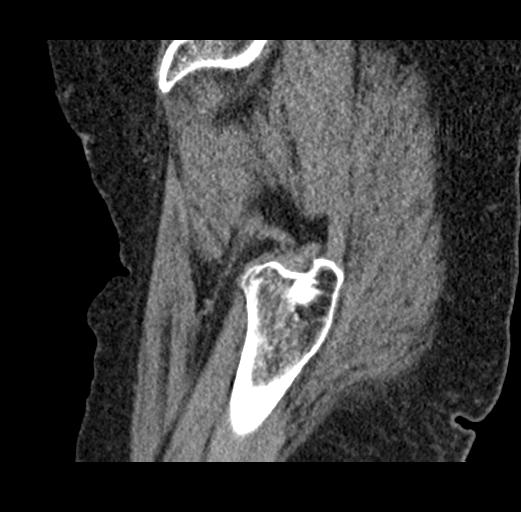
[im 43/128  bone]
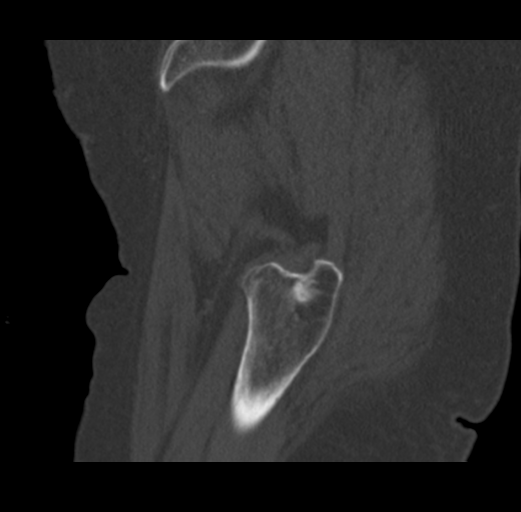

[11 of 46 positions shown; findings below may reference images not displayed]

FINDINGS: Bones/Joint/Cartilage

There is no evidence of acute fracture, subluxation or dislocation.
No joint abnormalities are identified.

Ligaments

Suboptimally assessed by CT.

Muscles and Tendons

Unremarkable

Soft tissues

Unremarkable
IMPRESSION: No acute abnormalities.

## 2019-02-09 IMAGING — CR DG CHEST 2V
2 series · 2 of 2 positions shown · non-contrast
Comparison: 07/25/2015 and prior exams

CLINICAL DATA: Left chest pain following fall today. Initial
encounter.

EXAM:
CHEST  2 VIEW

[w chest lat]
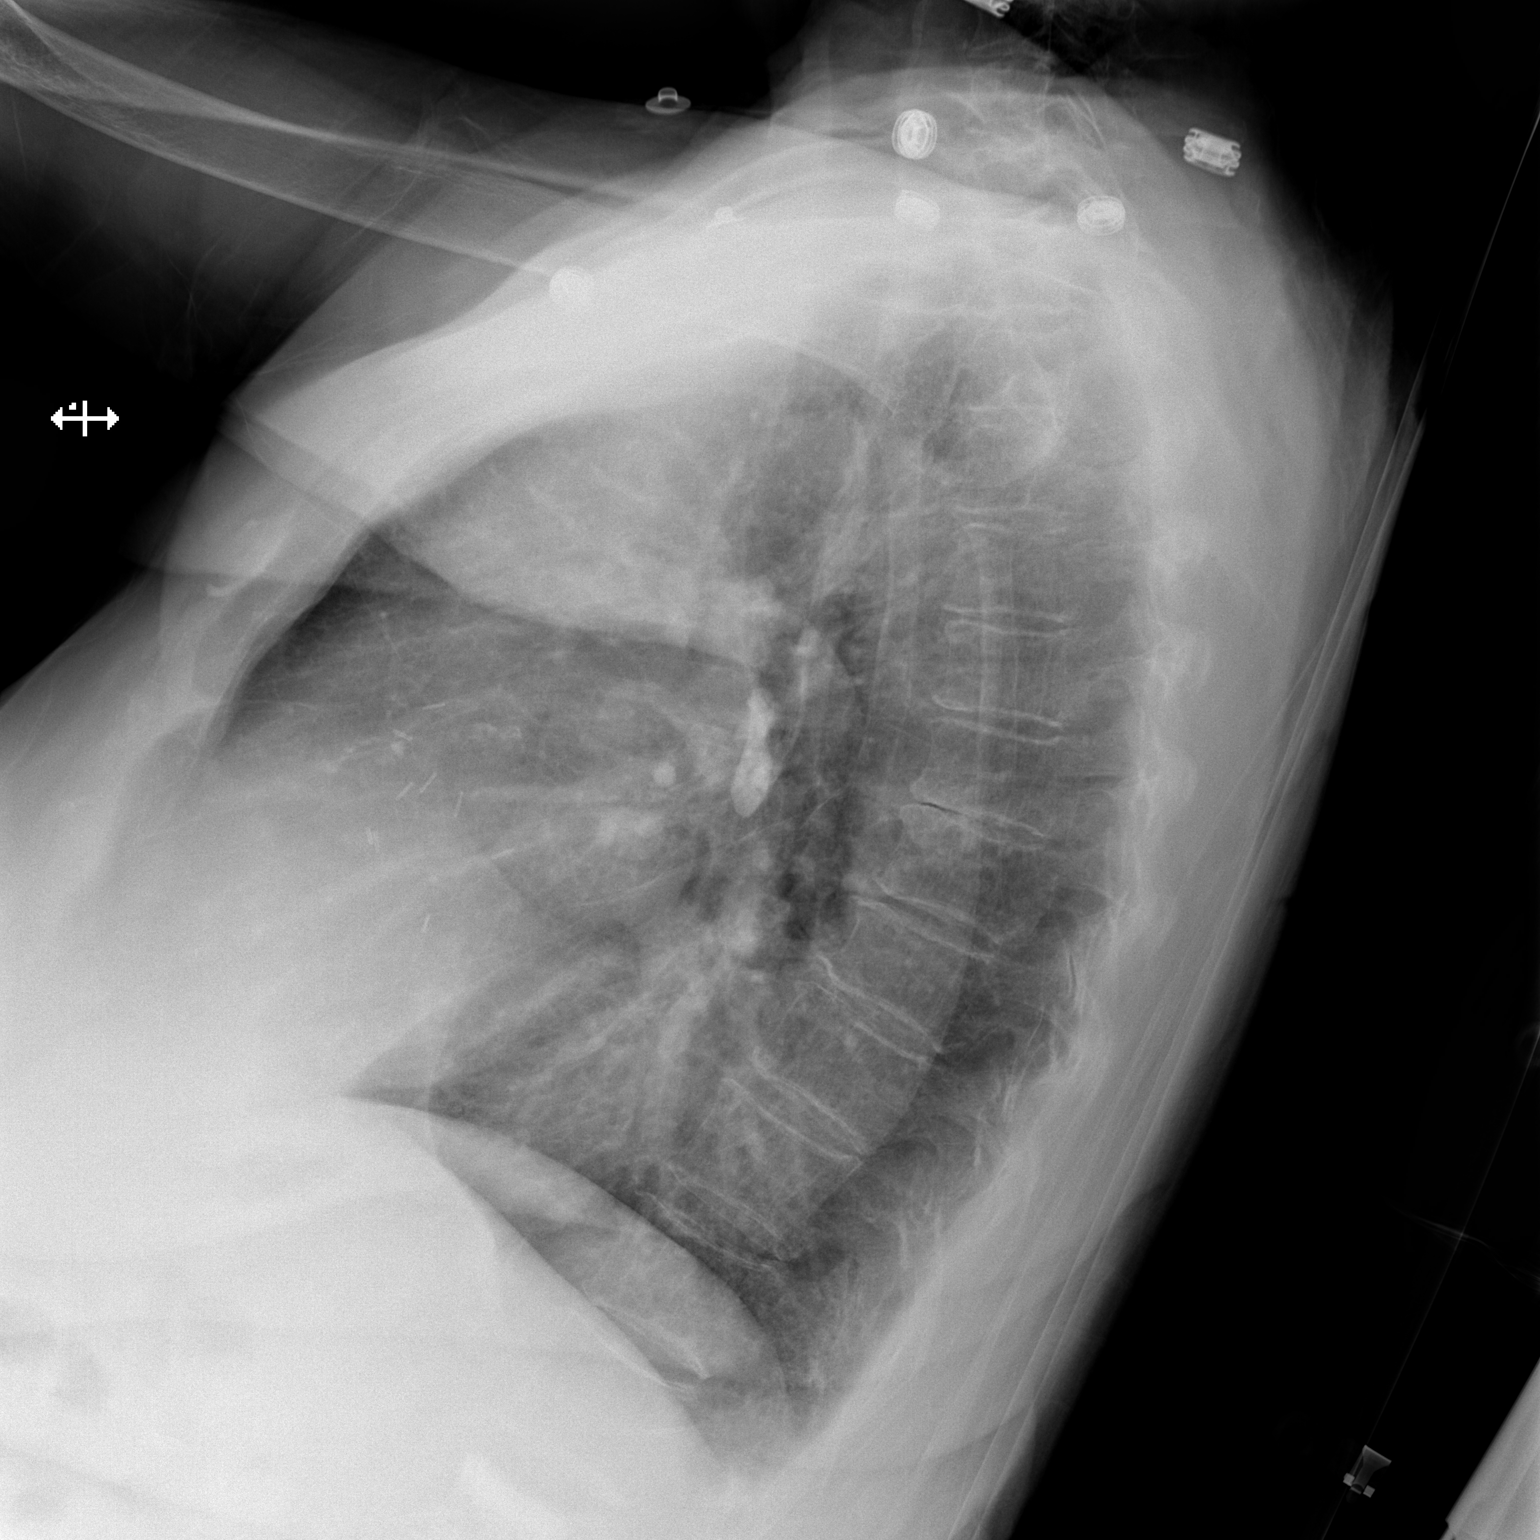

[x chest ap]
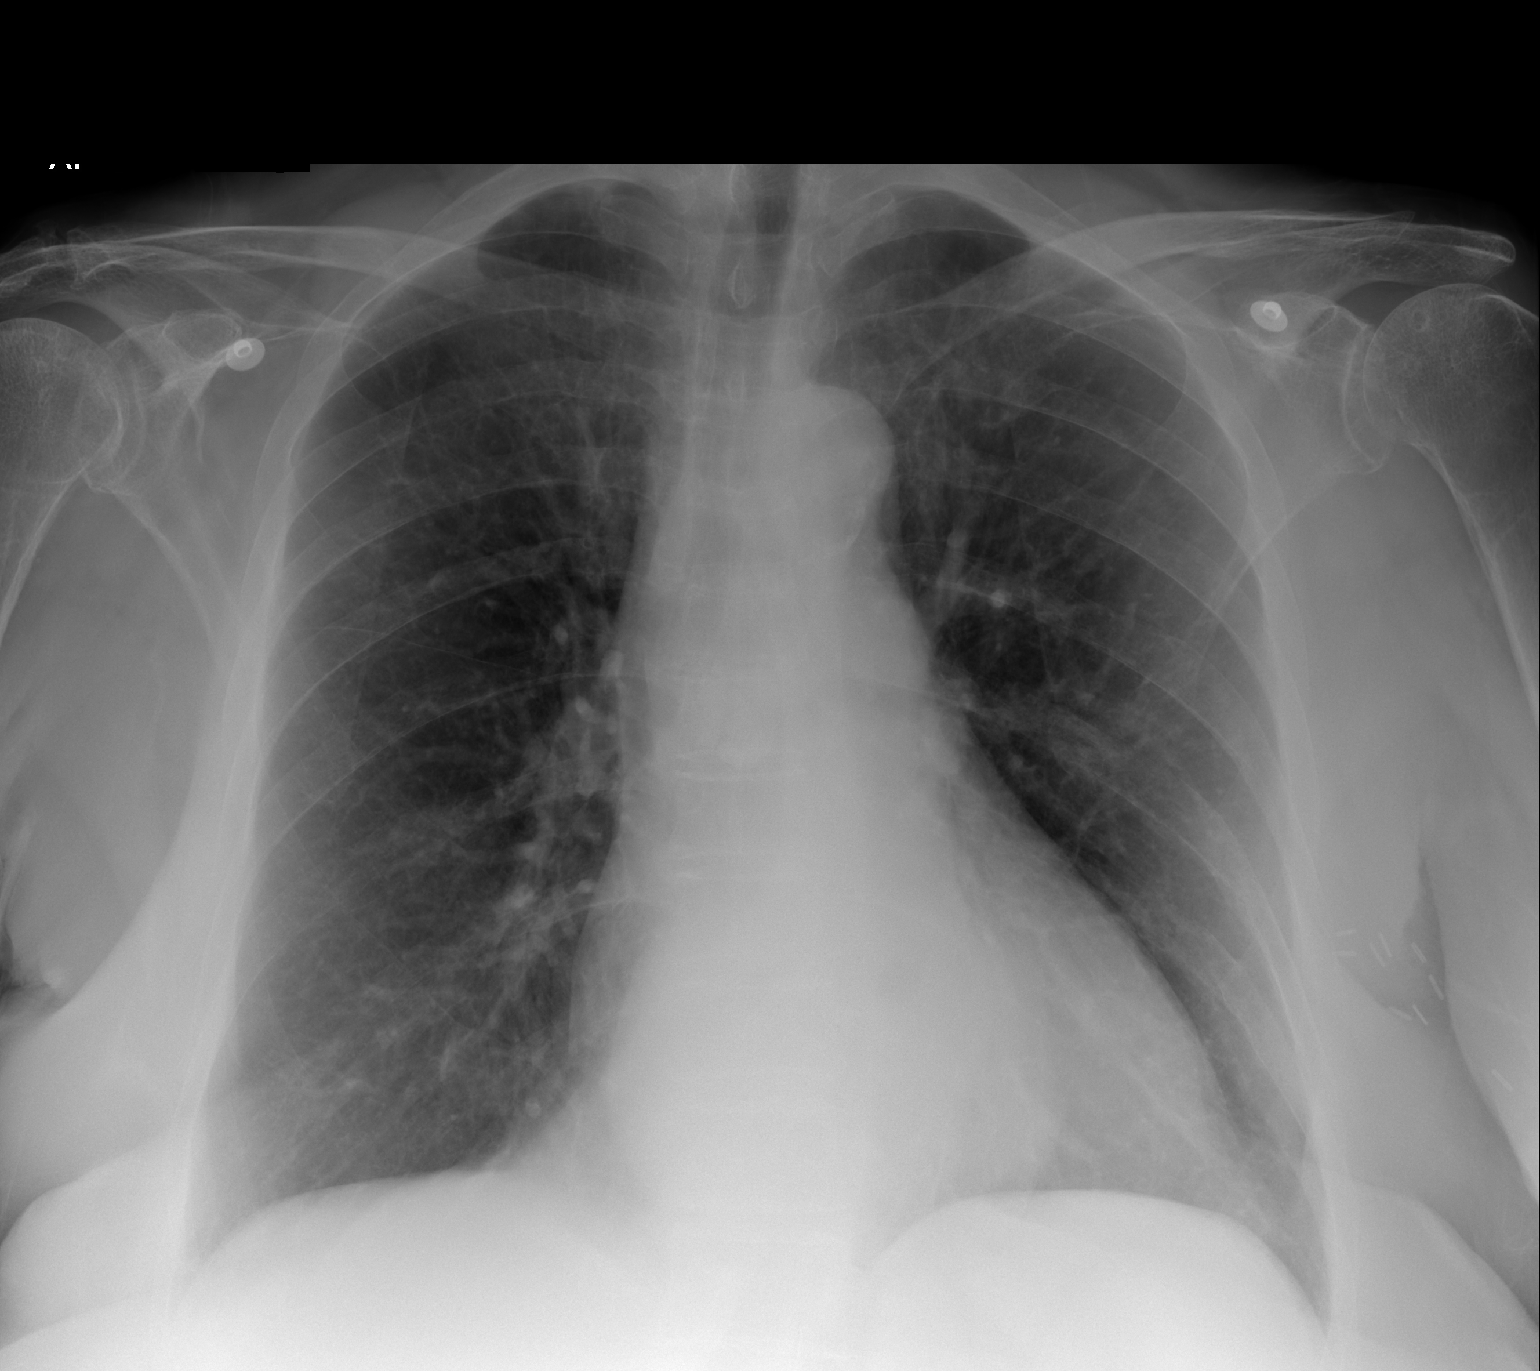

[2 of 2 positions shown; findings below may reference images not displayed]

FINDINGS: The cardiomediastinal silhouette is unremarkable.

There is no evidence of focal airspace disease, pulmonary edema,
suspicious pulmonary nodule/mass, pleural effusion, or pneumothorax.
No acute bony abnormalities are identified.

Surgical clips overlying the left breast again noted.
IMPRESSION: No active cardiopulmonary disease.

## 2019-02-16 ENCOUNTER — Encounter: Payer: Self-pay | Admitting: Family

## 2019-02-16 ENCOUNTER — Other Ambulatory Visit: Payer: Self-pay

## 2019-02-16 ENCOUNTER — Inpatient Hospital Stay: Payer: Medicare Other | Attending: Hematology & Oncology

## 2019-02-16 ENCOUNTER — Inpatient Hospital Stay: Payer: Medicare Other

## 2019-02-16 ENCOUNTER — Inpatient Hospital Stay (HOSPITAL_BASED_OUTPATIENT_CLINIC_OR_DEPARTMENT_OTHER): Payer: Medicare Other | Admitting: Family

## 2019-02-16 VITALS — BP 116/89 | HR 62

## 2019-02-16 VITALS — BP 138/68 | HR 64 | Temp 97.3°F | Resp 16 | Wt 160.0 lb

## 2019-02-16 DIAGNOSIS — D751 Secondary polycythemia: Secondary | ICD-10-CM

## 2019-02-16 DIAGNOSIS — K219 Gastro-esophageal reflux disease without esophagitis: Secondary | ICD-10-CM | POA: Diagnosis not present

## 2019-02-16 DIAGNOSIS — R21 Rash and other nonspecific skin eruption: Secondary | ICD-10-CM | POA: Diagnosis not present

## 2019-02-16 DIAGNOSIS — I2510 Atherosclerotic heart disease of native coronary artery without angina pectoris: Secondary | ICD-10-CM | POA: Diagnosis not present

## 2019-02-16 DIAGNOSIS — Z853 Personal history of malignant neoplasm of breast: Secondary | ICD-10-CM | POA: Diagnosis not present

## 2019-02-16 DIAGNOSIS — D45 Polycythemia vera: Secondary | ICD-10-CM | POA: Insufficient documentation

## 2019-02-16 DIAGNOSIS — D500 Iron deficiency anemia secondary to blood loss (chronic): Secondary | ICD-10-CM

## 2019-02-16 LAB — CBC WITH DIFFERENTIAL (CANCER CENTER ONLY)
Abs Immature Granulocytes: 0.03 10*3/uL (ref 0.00–0.07)
Basophils Absolute: 0.1 10*3/uL (ref 0.0–0.1)
Basophils Relative: 1 %
Eosinophils Absolute: 0.1 10*3/uL (ref 0.0–0.5)
Eosinophils Relative: 2 %
HCT: 44.4 % (ref 36.0–46.0)
Hemoglobin: 13 g/dL (ref 12.0–15.0)
Immature Granulocytes: 1 %
Lymphocytes Relative: 13 %
Lymphs Abs: 0.9 10*3/uL (ref 0.7–4.0)
MCH: 23.7 pg — ABNORMAL LOW (ref 26.0–34.0)
MCHC: 29.3 g/dL — ABNORMAL LOW (ref 30.0–36.0)
MCV: 80.9 fL (ref 80.0–100.0)
Monocytes Absolute: 0.6 10*3/uL (ref 0.1–1.0)
Monocytes Relative: 9 %
Neutro Abs: 4.9 10*3/uL (ref 1.7–7.7)
Neutrophils Relative %: 74 %
Platelet Count: 402 10*3/uL — ABNORMAL HIGH (ref 150–400)
RBC: 5.49 MIL/uL — ABNORMAL HIGH (ref 3.87–5.11)
RDW: 16.8 % — ABNORMAL HIGH (ref 11.5–15.5)
WBC Count: 6.6 10*3/uL (ref 4.0–10.5)
nRBC: 0 % (ref 0.0–0.2)

## 2019-02-16 LAB — CMP (CANCER CENTER ONLY)
ALT: 10 U/L (ref 0–44)
AST: 16 U/L (ref 15–41)
Albumin: 4.3 g/dL (ref 3.5–5.0)
Alkaline Phosphatase: 68 U/L (ref 38–126)
Anion gap: 5 (ref 5–15)
BUN: 8 mg/dL (ref 8–23)
CO2: 31 mmol/L (ref 22–32)
Calcium: 9.9 mg/dL (ref 8.9–10.3)
Chloride: 99 mmol/L (ref 98–111)
Creatinine: 0.83 mg/dL (ref 0.44–1.00)
GFR, Est AFR Am: >60 mL/min (ref >60)
GFR, Estimated: >60 mL/min (ref >60)
Glucose, Bld: 106 mg/dL — ABNORMAL HIGH (ref 70–99)
Potassium: 4.4 mmol/L (ref 3.5–5.1)
Sodium: 135 mmol/L (ref 135–145)
Total Bilirubin: 1.7 mg/dL — ABNORMAL HIGH (ref 0.3–1.2)
Total Protein: 7 g/dL (ref 6.5–8.1)

## 2019-02-16 MED ORDER — VALACYCLOVIR HCL 500 MG PO TABS: ORAL_TABLET | ORAL | 1 refills | Status: DC

## 2019-02-16 MED FILL — VALACYCLOVIR HCL 500 MG TAB: 500 | 8 days supply | Qty: 30 | Fill #0

## 2019-02-16 NOTE — Progress Notes (Signed)
Only half a phlebotomy today per sarah NP Lonia Farber presents today for phlebotomy per MD orders. Phlebotomy procedure started at 1425 and ended at 1435 260 grams removed via 20 G to RAC. (2nd attempt) Patient observed for 30 minutes after procedure without any incident. Patient tolerated procedure well. Diet and nutrition offered. IV needle removed intact.

## 2019-02-16 NOTE — Progress Notes (Signed)
Hematology and Oncology Follow Up Visit  [name] [id number] July 07, 1929 85 y.o. 02/16/2019   Principle Diagnosis:  Polycythemia vera - JAK2 positive Stage I (T1bN0M0 ) infiltrating duct carcinoma the left breast   Past Therapy: Femara 2.5 mg by mouth daily - stopped 08/2015  Current Therapy:   Phlebotomy to maintain hematocrit below 45% Hydrea 500 mg by mouth every other day   Interim History:  [name] is here today for follow-up. She is here today for follow-up. She is feeling fatigued lately. Hct is 44.4%.  She ran out of her Prilosec for a few days and has had GERD.  No fever, chills, n/v, cough, rash, dizziness, SOB, chest pain, palpitations or changes in bowel or bladder habits.  She uses a suppository when needed for left abdominal cramps.  No episodes of bleeding. No bruising or petechiae.  No swelling or tenderness in her extremities.  She has mild numbness and tingling in her legs that comes and goes. She states that wearing compression stockings helps.  She is eating well and staying hydrated. Her weight is stable.   ECOG Performance Status: 1 - Symptomatic but completely ambulatory  Medications:  Allergies as of 02/16/2019      Reactions   Penicillins Hives, Rash, Other (See Comments)   Has patient had a PCN reaction causing immediate rash, facial/tongue/throat swelling, SOB or lightheadedness with hypotension: Yes Has patient had a PCN reaction causing severe rash involving mucus membranes or skin necrosis: yes Has patient had a PCN reaction that required hospitalization: No Has patient had a PCN reaction occurring within the last 10 years: no If all of the above answers are "NO", then may proceed with Cephalosporin use. Other Reaction: OTHER REACTION   Valsartan Itching, Other (See Comments)   Atorvastatin Diarrhea   Carvedilol Other (See Comments)   "teeth hurt when I took it"   Ezetimibe Nausea Only   Fluvastatin Sodium Other (See Comments)    "Can't remember"   Magnesium Hydroxide Other (See Comments)   REACTION: triggers HAs   Meloxicam Other (See Comments)   Pt seeing auro's / spots.     Omeprazole Magnesium Other (See Comments)   auras   Pneumovax [pneumococcal Polysaccharide Vaccine] Other (See Comments)   Local reaction   Quinapril Hcl Other (See Comments)    "feelings of tiredness"   Simvastatin Diarrhea   Topamax [topiramate] Itching   Vit D-vit E-safflower Oil Other (See Comments)   Headaches      Medication List       Accurate as of February 16, 2019  1:45 PM. If you have any questions, ask your nurse or doctor.        acetaminophen-codeine 300-30 MG tablet Commonly known as: TYLENOL #3 Take 1 tablet by mouth every 8 (eight) hours as needed. For migraine   aspirin 81 MG tablet Take 81 mg by mouth daily.   bismuth subsalicylate 99991111 [id number] suspension Commonly known as: PEPTO BISMOL Take 30 mLs by mouth every other day.   hydrochlorothiazide 25 MG tablet Commonly known as: HYDRODIURIL Take 1 tablet (25 mg total) by mouth daily as needed (swelling in the legs ).   hydroxyurea 500 MG capsule Commonly known as: HYDREA TAKE 1 CAPSULE BY MOUTH EVERY OTHER DAY TAKE WITH FOOD TO MINIMIZE SIDE EFFECT   LORazepam 0.5 MG tablet Commonly known as: ATIVAN TAKE 1/2 TABLET BY MOUTH EVERY MORNING AND EVERY AFTERNOON. TAKE 2 TABLETS BY MOUTH AT BEDTIME AS NEEDED FOR ANXIETY.   metoprolol  tartrate 50 MG tablet Commonly known as: LOPRESSOR Take 1 tablet (50 mg total) by mouth 2 (two) times daily.   mometasone 0.1 % cream Commonly known as: Elocon Apply 1 application topically daily.   omeprazole 20 MG capsule Commonly known as: PRILOSEC Take 20 mg by mouth daily.       Allergies:  Allergies  Allergen Reactions  . Penicillins Hives, Rash and Other (See Comments)    Has patient had a PCN reaction causing immediate rash, facial/tongue/throat swelling, SOB or lightheadedness with hypotension: Yes Has  patient had a PCN reaction causing severe rash involving mucus membranes or skin necrosis: yes Has patient had a PCN reaction that required hospitalization: No Has patient had a PCN reaction occurring within the last 10 years: no If all of the above answers are "NO", then may proceed with Cephalosporin use.  Other Reaction: OTHER REACTION  . Valsartan Itching and Other (See Comments)  . Atorvastatin Diarrhea  . Carvedilol Other (See Comments)    "teeth hurt when I took it"   . Ezetimibe Nausea Only  . Fluvastatin Sodium Other (See Comments)    "Can't remember"  . Magnesium Hydroxide Other (See Comments)    REACTION: triggers HAs  . Meloxicam Other (See Comments)    Pt seeing auro's / spots.    . Omeprazole Magnesium Other (See Comments)    auras  . Pneumovax [Pneumococcal Polysaccharide Vaccine] Other (See Comments)    Local reaction  . Quinapril Hcl Other (See Comments)     "feelings of tiredness"  . Simvastatin Diarrhea  . Topamax [Topiramate] Itching  . Vit D-Vit E-Safflower Oil Other (See Comments)    Headaches    Past Medical History, Surgical history, Social history, and Family History were reviewed and updated.  Review of Systems: All other 10 point review of systems is negative.   Physical Exam:  vitals were not taken for this visit.   Wt Readings from Last 3 Encounters:  01/26/19 162 lb 2 oz (73.5 kg)  01/05/19 162 lb (73.5 kg)  12/04/18 161 lb (73 kg)    Ocular: Sclerae unicteric, pupils equal, round and reactive to light Ear-nose-throat: Oropharynx clear, dentition fair Lymphatic: No cervical or supraclavicular adenopathy Lungs no rales or rhonchi, good excursion bilaterally Heart regular rate and rhythm, no murmur appreciated Abd soft, nontender, positive bowel sounds, no liver or spleen tip palpated on exam, no fluid wave  MSK no focal spinal tenderness, no joint edema Neuro: non-focal, well-oriented, appropriate affect Breasts: Deferred   Lab  Results  Component Value Date   WBC 6.6 02/16/2019   HGB 13.0 02/16/2019   HCT 44.4 02/16/2019   MCV 80.9 02/16/2019   PLT 402 (H) 02/16/2019   Lab Results  Component Value Date   FERRITIN 35 01/05/2019   IRON 21 (L) 01/05/2019   TIBC 451 (H) 01/05/2019   UIBC 430 (H) 01/05/2019   IRONPCTSAT 5 (L) 01/05/2019   Lab Results  Component Value Date   RETICCTPCT 0.8 02/02/2015   RBC 5.49 (H) 02/16/2019   RETICCTABS 41.4 02/02/2015   No results found for: KPAFRELGTCHN, LAMBDASER, KAPLAMBRATIO No results found for: IGGSERUM, IGA, IGMSERUM No results found for: Ronnald Ramp, A1GS, A2GS, Violet Baldy, MSPIKE, SPEI   Chemistry      Component Value Date/Time   NA 136 01/05/2019 1327   NA 142 03/25/2017 1457   NA 139 04/10/2016 0855   K 4.5 01/05/2019 1327   K 4.1 03/25/2017 1457   K 4.2 04/10/2016  0855   CL 98 01/05/2019 1327   CL 96 (L) 03/25/2017 1457   CO2 31 01/05/2019 1327   CO2 32 03/25/2017 1457   CO2 29 04/10/2016 0855   BUN 6 (L) 01/05/2019 1327   BUN 7 03/25/2017 1457   BUN 10.0 04/10/2016 0855   CREATININE 0.89 01/05/2019 1327   CREATININE 1.0 03/25/2017 1457   CREATININE 0.8 04/10/2016 0855      Component Value Date/Time   CALCIUM 9.4 01/05/2019 1327   CALCIUM 9.8 03/25/2017 1457   CALCIUM 9.5 04/10/2016 0855   ALKPHOS 58 01/05/2019 1327   ALKPHOS 86 (H) 03/25/2017 1457   ALKPHOS 85 04/10/2016 0855   AST 16 01/05/2019 1327   AST 19 04/10/2016 0855   ALT 8 01/05/2019 1327   ALT 15 03/25/2017 1457   ALT 13 04/10/2016 0855   BILITOT 1.3 (H) 01/05/2019 1327   BILITOT 1.26 (H) 04/10/2016 0855       Impression and Plan: [name] is a very pleasant 85 yo caucasian female with polycythemia and history of stage I infiltrating ductal carcinoma of the left breast with lumpectomy in 2013. Hct is 44.4% and she has been symptomatic with fatigue.  We will proceed with a mini phlebotomy today removing 250 ml.  She will remain on her same  dose of Hydrea per Dr. Marin Olp.  We did refill her Valacyclovir for her for the occasional post herpetic rash.  We will plan to see her in another 6 weeks.  She will contact our office with any questions or concerns. We can certainly see her sooner if needed.   [name], NP 10/5/20201:45 PM

## 2019-02-17 LAB — IRON AND TIBC
Iron: 42 ug/dL (ref 41–142)
Saturation Ratios: 9 % — ABNORMAL LOW (ref 21–57)
TIBC: 441 ug/dL (ref 236–444)
UIBC: 400 ug/dL — ABNORMAL HIGH (ref 120–384)

## 2019-02-17 LAB — FERRITIN: Ferritin: 42 ng/mL (ref 11–307)

## 2019-03-06 ENCOUNTER — Other Ambulatory Visit: Payer: Self-pay | Admitting: Hematology & Oncology

## 2019-03-30 ENCOUNTER — Telehealth: Payer: Self-pay | Admitting: Hematology & Oncology

## 2019-03-30 ENCOUNTER — Inpatient Hospital Stay: Payer: Medicare Other | Attending: Hematology & Oncology | Admitting: Hematology & Oncology

## 2019-03-30 ENCOUNTER — Inpatient Hospital Stay: Payer: Medicare Other

## 2019-03-30 ENCOUNTER — Encounter: Payer: Self-pay | Admitting: Hematology & Oncology

## 2019-03-30 ENCOUNTER — Other Ambulatory Visit: Payer: Self-pay

## 2019-03-30 VITALS — BP 153/62 | HR 64 | Temp 97.1°F | Resp 16 | Wt 163.0 lb

## 2019-03-30 DIAGNOSIS — D45 Polycythemia vera: Secondary | ICD-10-CM | POA: Insufficient documentation

## 2019-03-30 DIAGNOSIS — Z7982 Long term (current) use of aspirin: Secondary | ICD-10-CM | POA: Diagnosis not present

## 2019-03-30 DIAGNOSIS — D751 Secondary polycythemia: Secondary | ICD-10-CM

## 2019-03-30 DIAGNOSIS — D500 Iron deficiency anemia secondary to blood loss (chronic): Secondary | ICD-10-CM

## 2019-03-30 DIAGNOSIS — I2510 Atherosclerotic heart disease of native coronary artery without angina pectoris: Secondary | ICD-10-CM | POA: Diagnosis not present

## 2019-03-30 DIAGNOSIS — Z853 Personal history of malignant neoplasm of breast: Secondary | ICD-10-CM | POA: Insufficient documentation

## 2019-03-30 DIAGNOSIS — C50011 Malignant neoplasm of nipple and areola, right female breast: Secondary | ICD-10-CM | POA: Diagnosis not present

## 2019-03-30 DIAGNOSIS — Z79899 Other long term (current) drug therapy: Secondary | ICD-10-CM | POA: Diagnosis not present

## 2019-03-30 LAB — CMP (CANCER CENTER ONLY)
ALT: 7 U/L (ref 0–44)
AST: 15 U/L (ref 15–41)
Albumin: 4.4 g/dL (ref 3.5–5.0)
Alkaline Phosphatase: 64 U/L (ref 38–126)
Anion gap: 7 (ref 5–15)
BUN: 9 mg/dL (ref 8–23)
CO2: 31 mmol/L (ref 22–32)
Calcium: 9.8 mg/dL (ref 8.9–10.3)
Chloride: 96 mmol/L — ABNORMAL LOW (ref 98–111)
Creatinine: 0.8 mg/dL (ref 0.44–1.00)
GFR, Est AFR Am: >60 mL/min (ref >60)
GFR, Estimated: >60 mL/min (ref >60)
Glucose, Bld: 123 mg/dL — ABNORMAL HIGH (ref 70–99)
Potassium: 4.3 mmol/L (ref 3.5–5.1)
Sodium: 134 mmol/L — ABNORMAL LOW (ref 135–145)
Total Bilirubin: 1.3 mg/dL — ABNORMAL HIGH (ref 0.3–1.2)
Total Protein: 6.7 g/dL (ref 6.5–8.1)

## 2019-03-30 LAB — IRON AND TIBC
Iron: 20 ug/dL — ABNORMAL LOW (ref 41–142)
Saturation Ratios: 4 % — ABNORMAL LOW (ref 21–57)
TIBC: 469 ug/dL — ABNORMAL HIGH (ref 236–444)
UIBC: 449 ug/dL — ABNORMAL HIGH (ref 120–384)

## 2019-03-30 LAB — CBC WITH DIFFERENTIAL (CANCER CENTER ONLY)
Abs Immature Granulocytes: 0.03 10*3/uL (ref 0.00–0.07)
Basophils Absolute: 0.1 10*3/uL (ref 0.0–0.1)
Basophils Relative: 1 %
Eosinophils Absolute: 0.2 10*3/uL (ref 0.0–0.5)
Eosinophils Relative: 2 %
HCT: 40.4 % (ref 36.0–46.0)
Hemoglobin: 11.9 g/dL — ABNORMAL LOW (ref 12.0–15.0)
Immature Granulocytes: 0 %
Lymphocytes Relative: 18 %
Lymphs Abs: 1.2 10*3/uL (ref 0.7–4.0)
MCH: 23.5 pg — ABNORMAL LOW (ref 26.0–34.0)
MCHC: 29.5 g/dL — ABNORMAL LOW (ref 30.0–36.0)
MCV: 79.8 fL — ABNORMAL LOW (ref 80.0–100.0)
Monocytes Absolute: 0.6 10*3/uL (ref 0.1–1.0)
Monocytes Relative: 8 %
Neutro Abs: 4.6 10*3/uL (ref 1.7–7.7)
Neutrophils Relative %: 71 %
Platelet Count: 407 10*3/uL — ABNORMAL HIGH (ref 150–400)
RBC: 5.06 MIL/uL (ref 3.87–5.11)
RDW: 16.4 % — ABNORMAL HIGH (ref 11.5–15.5)
WBC Count: 6.7 10*3/uL (ref 4.0–10.5)
nRBC: 0 % (ref 0.0–0.2)

## 2019-03-30 LAB — FERRITIN: Ferritin: 39 ng/mL (ref 11–307)

## 2019-03-30 NOTE — Progress Notes (Signed)
Hematology and Oncology Follow Up Visit  Joelys Glasgow [id number] May 01, 1930 85 y.o. 03/30/2019   Principle Diagnosis:  Polycythemia vera - JAK2 positive Stage I (T1bN0M0 ) infiltrating duct carcinoma the left breast   Past Therapy: Femara 2.5 mg by mouth daily - stopped 08/2015  Current Therapy:   Phlebotomy to maintain hematocrit below 45% Hydrea 500 mg by mouth every other day -- d/c on 03/30/2019   Interim History:  Ms. Mares is here today for follow-up.  She is doing okay although she thinks that the Hydrea is causing her problems.  She says that the Hydrea is causing her issues with indigestion and burning of the mouth.  She only takes the Hydrea every other day.  I told her to stop the Hydrea for right now.  I do not see any problems with stopping it for 6 weeks and seeing how she does.  Her blood counts have been fairly well maintained.  She does feel tired.  I am sure that this probably is from iron deficiency.  We last saw her back in October, her ferritin was 42 with an iron saturation of only 9%.  I am sure we can probably give her iron back and she would feel better.  However, since she is going off the Hydrea, I would prefer not to have to get her iron up and then have her hemoglobin go up.  As far as breast cancer is concerned, this really is not an issue for her.  She has had no nausea or vomiting.  She is had no headache.  She had no leg swelling.  She does have some varicose veins that she wants to get taken care of.  I do not have any problems with her doing this.  It sounds like she will spend a nice Thanksgiving with her immediate family.  Overall, her performance status is ECOG 1.    Medications:  Allergies as of 03/30/2019      Reactions   Penicillins Hives, Rash, Other (See Comments)   Has patient had a PCN reaction causing immediate rash, facial/tongue/throat swelling, SOB or lightheadedness with hypotension: Yes Has patient had a PCN  reaction causing severe rash involving mucus membranes or skin necrosis: yes Has patient had a PCN reaction that required hospitalization: No Has patient had a PCN reaction occurring within the last 10 years: no If all of the above answers are "NO", then may proceed with Cephalosporin use. Other Reaction: OTHER REACTION   Valsartan Itching, Other (See Comments)   Atorvastatin Diarrhea   Carvedilol Other (See Comments)   "teeth hurt when I took it"   Ezetimibe Nausea Only   Fluvastatin Sodium Other (See Comments)   "Can't remember"   Magnesium Hydroxide Other (See Comments)   REACTION: triggers HAs   Meloxicam Other (See Comments)   Pt seeing auro's / spots.     Omeprazole Magnesium Other (See Comments)   auras   Pneumovax [pneumococcal Polysaccharide Vaccine] Other (See Comments)   Local reaction   Quinapril Hcl Other (See Comments)    "feelings of tiredness"   Simvastatin Diarrhea   Topamax [topiramate] Itching   Vit D-vit E-safflower Oil Other (See Comments)   Headaches      Medication List       Accurate as of March 30, 2019  1:36 PM. If you have any questions, ask your nurse or doctor.        acetaminophen-codeine 300-30 MG tablet Commonly known as: TYLENOL #3 Take 1 tablet  by mouth every 8 (eight) hours as needed. For migraine   aspirin 81 MG tablet Take 81 mg by mouth daily.   bismuth subsalicylate [id number] suspension Commonly known as: PEPTO BISMOL Take 30 mLs by mouth every other day.   hydrochlorothiazide 25 MG tablet Commonly known as: HYDRODIURIL Take 1 tablet (25 mg total) by mouth daily as needed (swelling in the legs ).   hydroxyurea 500 MG capsule Commonly known as: HYDREA TAKE 1 CAPSULE BY MOUTH EVERY OTHER DAY TAKE WITH FOOD TO MINIMIZE SIDE EFFECT   LORazepam 0.5 MG tablet Commonly known as: ATIVAN TAKE 1/2 TABLET BY MOUTH EVERY MORNING AND EVERY AFTERNOON. TAKE 2 TABLETS BY MOUTH AT BEDTIME AS NEEDED FOR ANXIETY.   metoprolol tartrate  50 MG tablet Commonly known as: LOPRESSOR Take 1 tablet (50 mg total) by mouth 2 (two) times daily.   mometasone 0.1 % cream Commonly known as: Elocon Apply 1 application topically daily.   omeprazole 20 MG capsule Commonly known as: PRILOSEC Take 20 mg by mouth daily.   valACYclovir 500 MG tablet Commonly known as: VALTREX Take 2 tablets at onset of rash and 2 tablets 12 hours later as needed.       Allergies:  Allergies  Allergen Reactions  . Penicillins Hives, Rash and Other (See Comments)    Has patient had a PCN reaction causing immediate rash, facial/tongue/throat swelling, SOB or lightheadedness with hypotension: Yes Has patient had a PCN reaction causing severe rash involving mucus membranes or skin necrosis: yes Has patient had a PCN reaction that required hospitalization: No Has patient had a PCN reaction occurring within the last 10 years: no If all of the above answers are "NO", then may proceed with Cephalosporin use.  Other Reaction: OTHER REACTION  . Valsartan Itching and Other (See Comments)  . Atorvastatin Diarrhea  . Carvedilol Other (See Comments)    "teeth hurt when I took it"   . Ezetimibe Nausea Only  . Fluvastatin Sodium Other (See Comments)    "Can't remember"  . Magnesium Hydroxide Other (See Comments)    REACTION: triggers HAs  . Meloxicam Other (See Comments)    Pt seeing auro's / spots.    . Omeprazole Magnesium Other (See Comments)    auras  . Pneumovax [Pneumococcal Polysaccharide Vaccine] Other (See Comments)    Local reaction  . Quinapril Hcl Other (See Comments)     "feelings of tiredness"  . Simvastatin Diarrhea  . Topamax [Topiramate] Itching  . Vit D-Vit E-Safflower Oil Other (See Comments)    Headaches    Past Medical History, Surgical history, Social history, and Family History were reviewed and updated.  Review of Systems: Review of Systems  Constitutional: Positive for malaise/fatigue.  HENT: Negative.   Eyes:  Negative.   Respiratory: Negative.   Cardiovascular: Negative.   Gastrointestinal: Positive for heartburn.  Genitourinary: Negative.   Musculoskeletal: Negative.   Skin: Negative.   Neurological: Negative.   Endo/Heme/Allergies: Negative.   Psychiatric/Behavioral: Negative.      Physical Exam:  weight is 163 lb (73.9 kg). Her temporal temperature is 97.1 F (36.2 C) (abnormal). Her blood pressure is 153/62 (abnormal) and her pulse is 64. Her respiration is 16 and oxygen saturation is 100%.   Wt Readings from Last 3 Encounters:  03/30/19 163 lb (73.9 kg)  02/16/19 160 lb (72.6 kg)  01/26/19 162 lb 2 oz (73.5 kg)    Physical Exam Vitals signs reviewed.  Constitutional:      Comments: Breast  exam shows right breast with no masses, edema or erythema.  There is no right axillary adenopathy.  Left breast shows a well-healed lumpectomy at about the 3 o'clock position.  There is no left breast swelling.  There is no left breast tenderness.  There is no left axillary adenopathy.  HENT:     Head: Normocephalic and atraumatic.  Eyes:     Pupils: Pupils are equal, round, and reactive to light.  Neck:     Musculoskeletal: Normal range of motion.  Cardiovascular:     Rate and Rhythm: Normal rate and regular rhythm.     Heart sounds: Normal heart sounds.  Pulmonary:     Effort: Pulmonary effort is normal.     Breath sounds: Normal breath sounds.  Abdominal:     General: Bowel sounds are normal.     Palpations: Abdomen is soft.  Musculoskeletal: Normal range of motion.        General: No tenderness or deformity.  Lymphadenopathy:     Cervical: No cervical adenopathy.  Skin:    General: Skin is warm and dry.     Findings: No erythema or rash.  Neurological:     Mental Status: She is alert and oriented to person, place, and time.  Psychiatric:        Behavior: Behavior normal.        Thought Content: Thought content normal.        Judgment: Judgment normal.      Lab Results   Component Value Date   WBC 6.7 03/30/2019   HGB 11.9 (L) 03/30/2019   HCT 40.4 03/30/2019   MCV 79.8 (L) 03/30/2019   PLT 407 (H) 03/30/2019   Lab Results  Component Value Date   FERRITIN 42 02/16/2019   IRON 42 02/16/2019   TIBC 441 02/16/2019   UIBC 400 (H) 02/16/2019   IRONPCTSAT 9 (L) 02/16/2019   Lab Results  Component Value Date   RETICCTPCT 0.8 02/02/2015   RBC 5.06 03/30/2019   RETICCTABS 41.4 02/02/2015   No results found for: KPAFRELGTCHN, LAMBDASER, KAPLAMBRATIO No results found for: Kandis Cocking, IGMSERUM No results found for: Kathrynn Ducking, MSPIKE, SPEI   Chemistry      Component Value Date/Time   NA 134 (L) 03/30/2019 1256   NA 142 03/25/2017 1457   NA 139 04/10/2016 0855   K 4.3 03/30/2019 1256   K 4.1 03/25/2017 1457   K 4.2 04/10/2016 0855   CL 96 (L) 03/30/2019 1256   CL 96 (L) 03/25/2017 1457   CO2 31 03/30/2019 1256   CO2 32 03/25/2017 1457   CO2 29 04/10/2016 0855   BUN 9 03/30/2019 1256   BUN 7 03/25/2017 1457   BUN 10.0 04/10/2016 0855   CREATININE 0.80 03/30/2019 1256   CREATININE 1.0 03/25/2017 1457   CREATININE 0.8 04/10/2016 0855      Component Value Date/Time   CALCIUM 9.8 03/30/2019 1256   CALCIUM 9.8 03/25/2017 1457   CALCIUM 9.5 04/10/2016 0855   ALKPHOS 64 03/30/2019 1256   ALKPHOS 86 (H) 03/25/2017 1457   ALKPHOS 85 04/10/2016 0855   AST 15 03/30/2019 1256   AST 19 04/10/2016 0855   ALT 7 03/30/2019 1256   ALT 15 03/25/2017 1457   ALT 13 04/10/2016 0855   BILITOT 1.3 (H) 03/30/2019 1256   BILITOT 1.26 (H) 04/10/2016 0855       Impression and Plan: Ms. Montilla is a very pleasant 85 yo caucasian female  with polycythemia and history of stage I infiltrating ductal carcinoma of the left breast with lumpectomy in 2013.  For right now, we will just have her come back in 6 weeks.  Again I will see any problems with her being off Hydrea for the 6-week time.  If her indigestion and  mouth get better, then we will have to see how we can manage her polycythemia.  She reads about polycythemia.  She thinks this is leukemia.  I reassured her that this was not leukemia.  I hope that she will have a nice Thanksgiving and Christmas.    Volanda Napoleon, MD 11/16/20201:36 PM

## 2019-03-30 NOTE — Telephone Encounter (Signed)
Appointments scheduled calendar printed per 11/16 los

## 2019-04-15 ENCOUNTER — Telehealth: Payer: Self-pay | Admitting: Internal Medicine

## 2019-04-15 DIAGNOSIS — F341 Dysthymic disorder: Secondary | ICD-10-CM

## 2019-04-15 NOTE — Telephone Encounter (Signed)
Lorazepam refill.   Last OV: 01/26/2019  Last Fill: 11/20/2018 #90 and 2RF Pt sig: 1/2 tab every morning, afternoon and 2 tabs qhs prn UDS: 05/19/2018 Low risk

## 2019-04-16 NOTE — Telephone Encounter (Signed)
Sent!

## 2019-05-13 ENCOUNTER — Inpatient Hospital Stay: Payer: Medicare Other

## 2019-05-13 ENCOUNTER — Inpatient Hospital Stay (HOSPITAL_BASED_OUTPATIENT_CLINIC_OR_DEPARTMENT_OTHER): Payer: Medicare Other | Admitting: Hematology & Oncology

## 2019-05-13 ENCOUNTER — Inpatient Hospital Stay: Payer: Medicare Other | Attending: Hematology & Oncology

## 2019-05-13 ENCOUNTER — Other Ambulatory Visit: Payer: Self-pay

## 2019-05-13 VITALS — BP 161/78 | HR 62 | Temp 96.9°F | Resp 19 | Wt 166.4 lb

## 2019-05-13 DIAGNOSIS — D509 Iron deficiency anemia, unspecified: Secondary | ICD-10-CM | POA: Diagnosis not present

## 2019-05-13 DIAGNOSIS — I2510 Atherosclerotic heart disease of native coronary artery without angina pectoris: Secondary | ICD-10-CM

## 2019-05-13 DIAGNOSIS — D500 Iron deficiency anemia secondary to blood loss (chronic): Secondary | ICD-10-CM

## 2019-05-13 DIAGNOSIS — Z853 Personal history of malignant neoplasm of breast: Secondary | ICD-10-CM | POA: Insufficient documentation

## 2019-05-13 DIAGNOSIS — D45 Polycythemia vera: Secondary | ICD-10-CM | POA: Diagnosis present

## 2019-05-13 DIAGNOSIS — D751 Secondary polycythemia: Secondary | ICD-10-CM

## 2019-05-13 DIAGNOSIS — C50011 Malignant neoplasm of nipple and areola, right female breast: Secondary | ICD-10-CM

## 2019-05-13 LAB — CMP (CANCER CENTER ONLY)
ALT: 8 U/L (ref 0–44)
AST: 14 U/L — ABNORMAL LOW (ref 15–41)
Albumin: 4.1 g/dL (ref 3.5–5.0)
Alkaline Phosphatase: 61 U/L (ref 38–126)
Anion gap: 5 (ref 5–15)
BUN: 8 mg/dL (ref 8–23)
CO2: 30 mmol/L (ref 22–32)
Calcium: 9.4 mg/dL (ref 8.9–10.3)
Chloride: 103 mmol/L (ref 98–111)
Creatinine: 0.9 mg/dL (ref 0.44–1.00)
GFR, Est AFR Am: >60 mL/min (ref >60)
GFR, Estimated: 57 mL/min — ABNORMAL LOW (ref >60)
Glucose, Bld: 90 mg/dL (ref 70–99)
Potassium: 4.5 mmol/L (ref 3.5–5.1)
Sodium: 138 mmol/L (ref 135–145)
Total Bilirubin: 1.3 mg/dL — ABNORMAL HIGH (ref 0.3–1.2)
Total Protein: 6.7 g/dL (ref 6.5–8.1)

## 2019-05-13 LAB — CBC WITH DIFFERENTIAL (CANCER CENTER ONLY)
Abs Immature Granulocytes: 0.05 10*3/uL (ref 0.00–0.07)
Basophils Absolute: 0.1 10*3/uL (ref 0.0–0.1)
Basophils Relative: 1 %
Eosinophils Absolute: 0.2 10*3/uL (ref 0.0–0.5)
Eosinophils Relative: 3 %
HCT: 41.1 % (ref 36.0–46.0)
Hemoglobin: 11.8 g/dL — ABNORMAL LOW (ref 12.0–15.0)
Immature Granulocytes: 1 %
Lymphocytes Relative: 14 %
Lymphs Abs: 1.2 10*3/uL (ref 0.7–4.0)
MCH: 22.4 pg — ABNORMAL LOW (ref 26.0–34.0)
MCHC: 28.7 g/dL — ABNORMAL LOW (ref 30.0–36.0)
MCV: 78.1 fL — ABNORMAL LOW (ref 80.0–100.0)
Monocytes Absolute: 0.8 10*3/uL (ref 0.1–1.0)
Monocytes Relative: 9 %
Neutro Abs: 5.8 10*3/uL (ref 1.7–7.7)
Neutrophils Relative %: 72 %
Platelet Count: 685 10*3/uL — ABNORMAL HIGH (ref 150–400)
RBC: 5.26 MIL/uL — ABNORMAL HIGH (ref 3.87–5.11)
RDW: 15.9 % — ABNORMAL HIGH (ref 11.5–15.5)
WBC Count: 8.1 10*3/uL (ref 4.0–10.5)
nRBC: 0 % (ref 0.0–0.2)

## 2019-05-13 IMAGING — MR MR LUMBAR SPINE W/O CM
4 of 5 series · 27 of 48 positions shown · non-contrast
Comparison: Lumbar MRI 09/06/2013

CLINICAL DATA: Low back pain with left hip pain

EXAM:
MRI LUMBAR SPINE WITHOUT CONTRAST
TECHNIQUE: Multiplanar, multisequence MR imaging of the lumbar spine was
performed. No intravenous contrast was administered.

[Series 4: T1 · sagittal · 4.0mm · 0.55mm/px · 5 of 13 slices shown (1 of 2)]
[im 1/13]
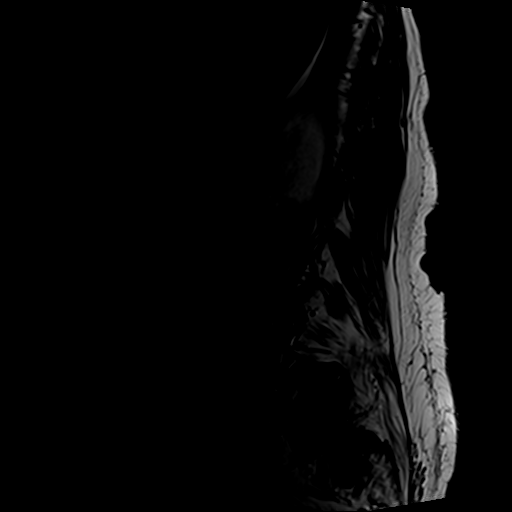
[im 4/13]
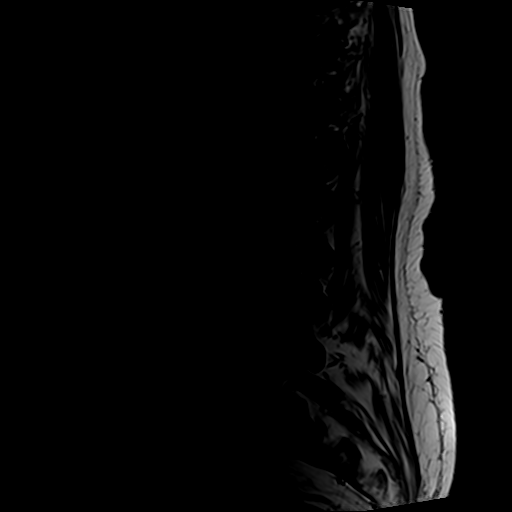
[im 7/13]
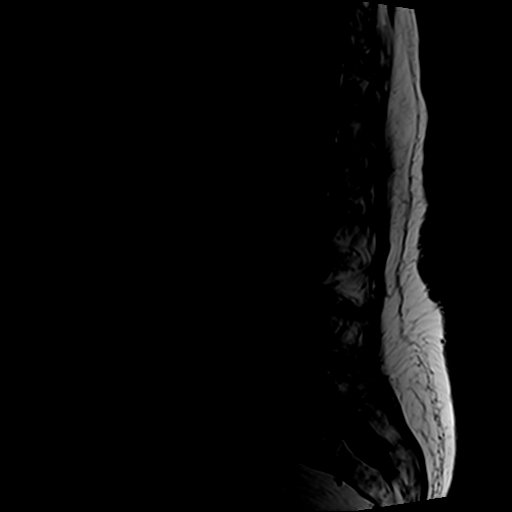
[im 10/13]
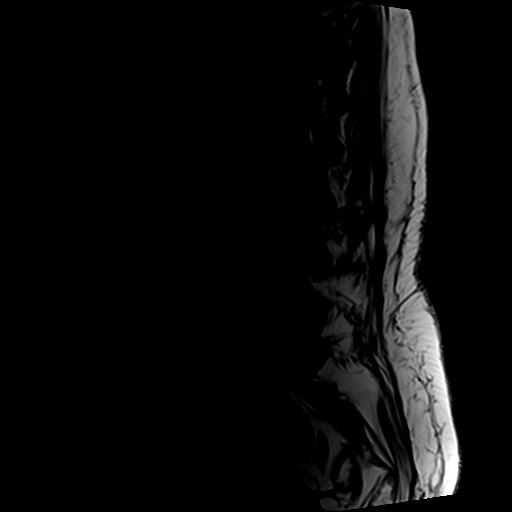
[im 13/13]
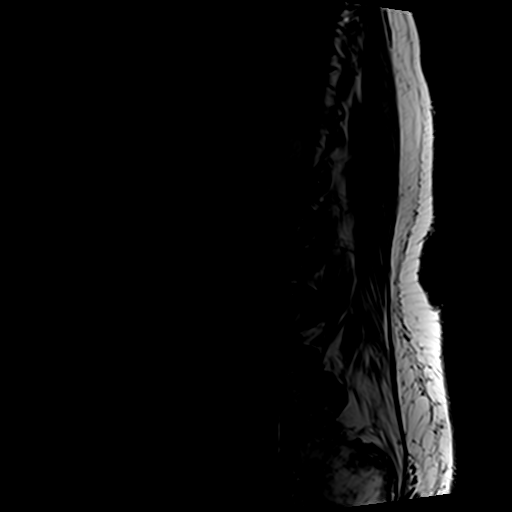

[Series 5: T2 · sagittal · 4.0mm · 0.55mm/px · 6 of 13 slices shown (1 of 2)]
[im 1/13]
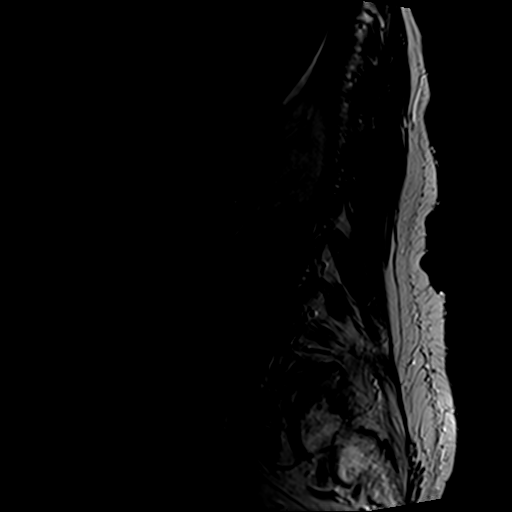
[im 3/13]
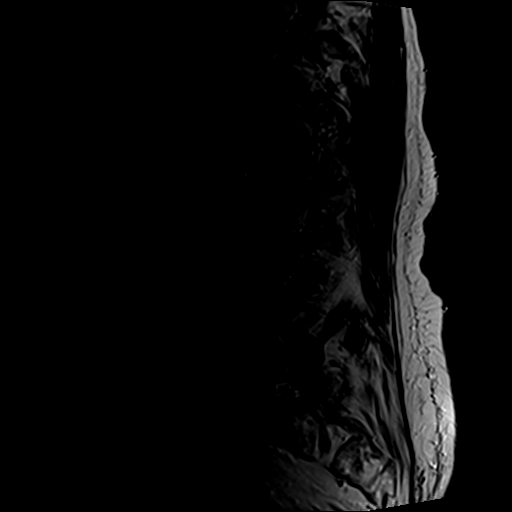
[im 5/13]
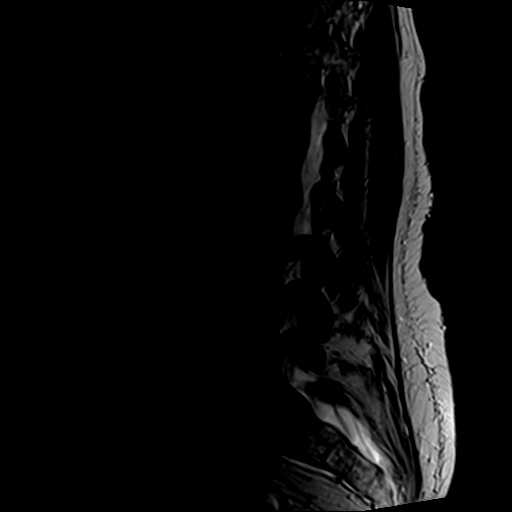
[im 8/13]
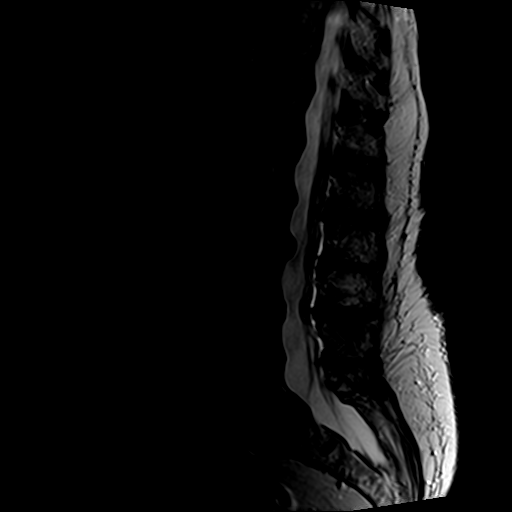
[im 10/13]
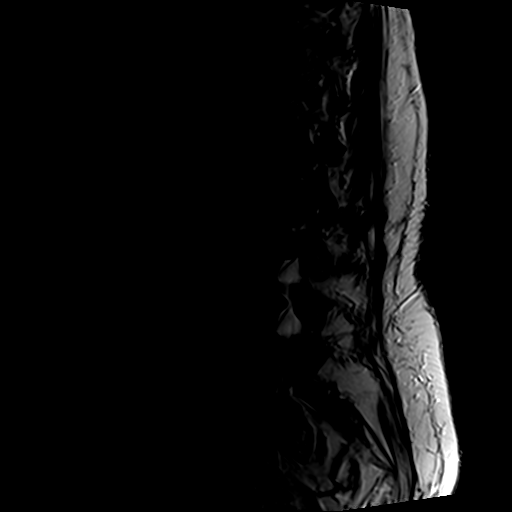
[im 13/13]
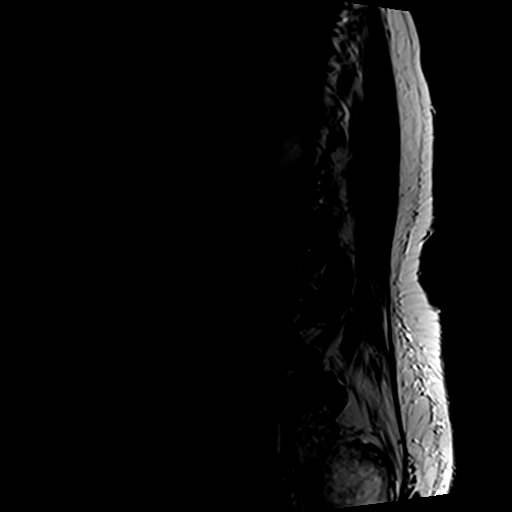

[Series 6: T2 · axial · 4.0mm · 0.70mm/px · z∈[-127,+55]mm · 10 of 37 slices shown (2 of 2)]
[im 3/37]
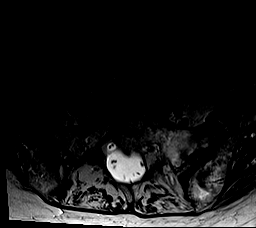
[im 5/37]
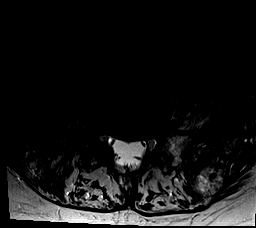
[im 8/37]
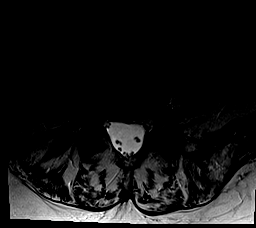
[im 13/37]
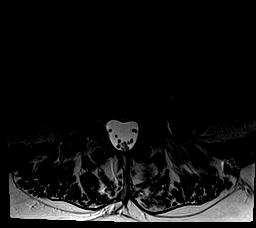
[im 17/37]
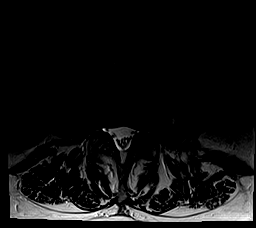
[im 20/37]
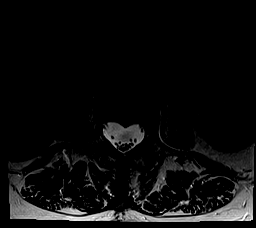
[im 22/37]
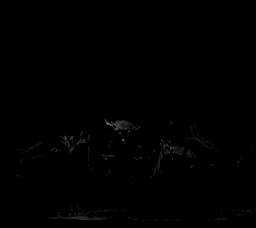
[im 27/37]
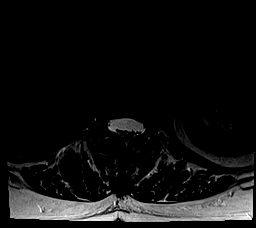
[im 32/37]
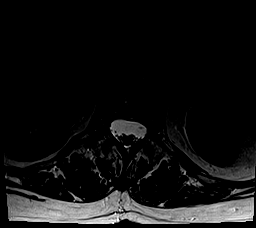
[im 37/37]
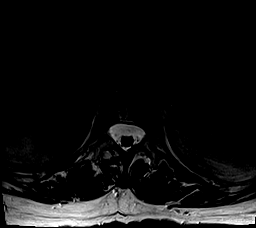

[Series 7: T1 · axial · 4.0mm · 0.35mm/px · z∈[-127,+29]mm · 6 of 37 slices shown (2 of 2)]
[im 3/37]
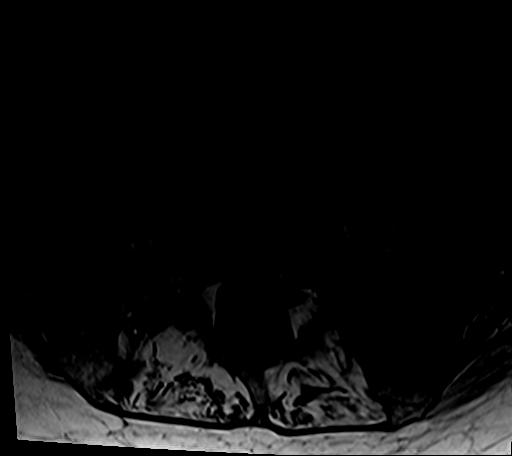
[im 5/37]
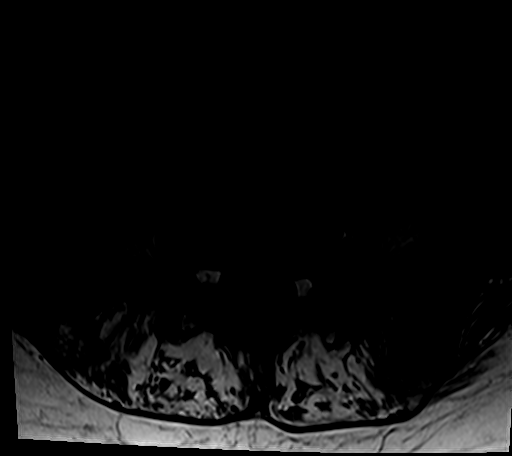
[im 8/37]
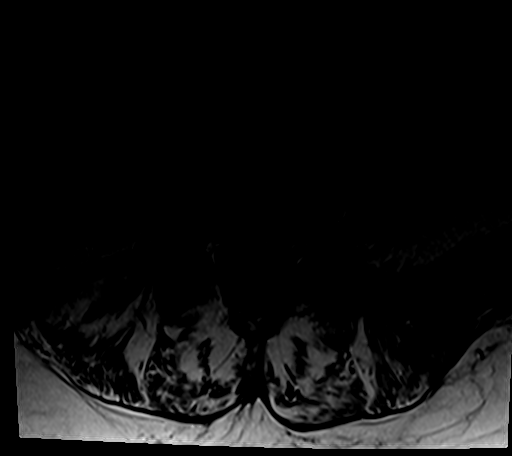
[im 13/37]
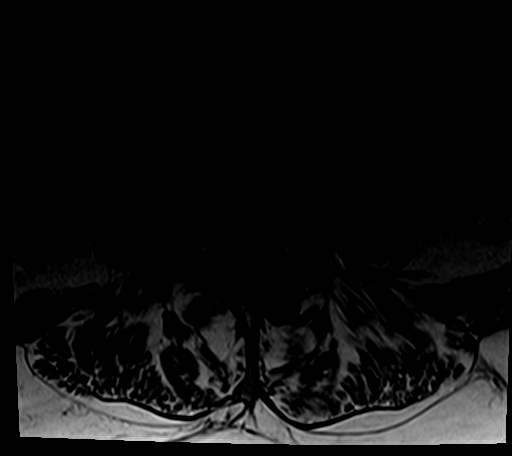
[im 20/37]
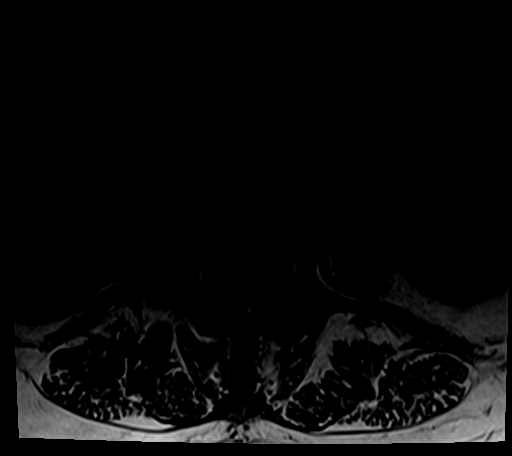
[im 32/37]
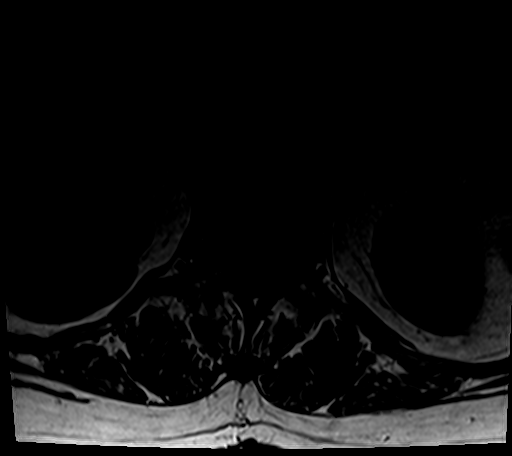

[27 of 48 positions shown; findings below may reference images not displayed]

FINDINGS: Segmentation:  Normal

Alignment:  Mild anterolisthesis L5-S1

Vertebrae: Chronic compression fracture L2 with vertebroplasty
unchanged from the prior study. Chronic compression fractures L3 and
L4 also unchanged

Bone marrow edema in the left sacrum most consistent with unilateral
sacral fracture on the left. Nondisplaced fracture.

Conus medullaris: Extends to the L1-2 level and appears normal.

Paraspinal and other soft tissues: Negative

Disc levels:

T12-L1:  Mild disc degeneration

L1-2: Mild disc degeneration and disc bulging. No significant
stenosis.

L2-3: Diffuse endplate spurring causing mild spinal stenosis.
Moderate subarticular and foraminal stenosis bilaterally, left
greater than right similar to the prior MRI.

L3-4: Disc bulging and diffuse endplate spurring left greater than
right. Moderate left foraminal encroachment unchanged. Central canal
adequate in size

L4-5: Moderate disc degeneration with disc space narrowing. No
significant stenosis

L5-S1: Mild anterolisthesis with bilateral facet degeneration.
Negative for stenosis.
IMPRESSION: Chronic compression fractures of L2, L3, L4

Unilateral bone marrow edema in the left sacrum compatible with
insufficiency fracture.

Multilevel degenerative changes in the lumbar spine similar to the
prior MRI.

## 2019-05-13 NOTE — Progress Notes (Signed)
Hematology and Oncology Follow Up Visit  [name] [id number] [birth year] 85 y.o. 05/13/2019   Principle Diagnosis:  Polycythemia vera - JAK2 positive Stage I (T1bN0M0 ) infiltrating duct carcinoma the left breast  Iron deficiency anemia -- symptomatic  Past Therapy: Femara 2.5 mg by mouth daily - stopped 08/2015  Current Therapy:   Phlebotomy to maintain hematocrit below 45% Hydrea 500 mg by mouth every other day -- d/c on 03/30/2019 IV Iron as needed --    Interim History:  [name] is here today for follow-up.  She is feeling better in the way off the Hydrea.  As such, we will stop the Hydrea.  The problem now is that her platelet count is up to 635,000.  She is iron deficient.  She does feel a little bit tired.  I told her we can try IV iron to see if this might help with the thrombocytosis.  She wants to think about this.  If we do not want to do IV iron, then I think the option would be anagrelide.  Again, we are looking at quality of life.  She is 85 years old.  I want to make sure that she enjoys her life and be able to do what she would like.  She has had no problems over the holidays.  She enjoyed Thanksgiving and Christmas.  She has had no problems with bowels or bladder.  There has been no bleeding.  She has had no leg swelling.  There is been no headache.  She has had no cough.  Overall, her performance status is ECOG 1.    Medications:  Allergies as of 05/13/2019      Reactions   Penicillins Hives, Rash, Other (See Comments)   Has patient had a PCN reaction causing immediate rash, facial/tongue/throat swelling, SOB or lightheadedness with hypotension: Yes Has patient had a PCN reaction causing severe rash involving mucus membranes or skin necrosis: yes Has patient had a PCN reaction that required hospitalization: No Has patient had a PCN reaction occurring within the last 10 years: no If all of the above answers are "NO", then may proceed  with Cephalosporin use. Other Reaction: OTHER REACTION   Valsartan Itching, Other (See Comments)   Atorvastatin Diarrhea   Carvedilol Other (See Comments)   "teeth hurt when I took it"   Ezetimibe Nausea Only   Fluvastatin Sodium Other (See Comments)   "Can't remember"   Magnesium Hydroxide Other (See Comments)   REACTION: triggers HAs   Meloxicam Other (See Comments)   Pt seeing auro's / spots.     Omeprazole Magnesium Other (See Comments)   auras   Pneumovax [pneumococcal Polysaccharide Vaccine] Other (See Comments)   Local reaction   Quinapril Hcl Other (See Comments)    "feelings of tiredness"   Simvastatin Diarrhea   Topamax [topiramate] Itching   Vit D-vit E-safflower Oil Other (See Comments)   Headaches      Medication List       Accurate as of May 13, 2019  2:18 PM. If you have any questions, ask your nurse or doctor.        acetaminophen-codeine 300-30 MG tablet Commonly known as: TYLENOL #3 Take 1 tablet by mouth every 8 (eight) hours as needed. For migraine   aspirin 81 MG tablet Take 81 mg by mouth daily.   bismuth subsalicylate 99991111 [id number] suspension Commonly known as: PEPTO BISMOL Take 30 mLs by mouth every other day.   hydrochlorothiazide 25 MG tablet Commonly  known as: HYDRODIURIL Take 1 tablet (25 mg total) by mouth daily as needed (swelling in the legs ).   hydroxyurea 500 MG capsule Commonly known as: HYDREA TAKE 1 CAPSULE BY MOUTH EVERY OTHER DAY TAKE WITH FOOD TO MINIMIZE SIDE EFFECT   LORazepam 0.5 MG tablet Commonly known as: ATIVAN TAKE 1/2 TABLET BY MOUTH EVERY MORNING AND EVERY AFTERNOON. TAKE 2 TABLETS BY MOUTH AT BEDTIME AS NEEDED   metoprolol tartrate 50 MG tablet Commonly known as: LOPRESSOR Take 1 tablet (50 mg total) by mouth 2 (two) times daily.   mometasone 0.1 % cream Commonly known as: Elocon Apply 1 application topically daily.   omeprazole 20 MG capsule Commonly known as: PRILOSEC Take 20 mg by mouth daily.     valACYclovir 500 MG tablet Commonly known as: VALTREX Take 2 tablets at onset of rash and 2 tablets 12 hours later as needed.       Allergies:  Allergies  Allergen Reactions  . Penicillins Hives, Rash and Other (See Comments)    Has patient had a PCN reaction causing immediate rash, facial/tongue/throat swelling, SOB or lightheadedness with hypotension: Yes Has patient had a PCN reaction causing severe rash involving mucus membranes or skin necrosis: yes Has patient had a PCN reaction that required hospitalization: No Has patient had a PCN reaction occurring within the last 10 years: no If all of the above answers are "NO", then may proceed with Cephalosporin use.  Other Reaction: OTHER REACTION  . Valsartan Itching and Other (See Comments)  . Atorvastatin Diarrhea  . Carvedilol Other (See Comments)    "teeth hurt when I took it"   . Ezetimibe Nausea Only  . Fluvastatin Sodium Other (See Comments)    "Can't remember"  . Magnesium Hydroxide Other (See Comments)    REACTION: triggers HAs  . Meloxicam Other (See Comments)    Pt seeing auro's / spots.    . Omeprazole Magnesium Other (See Comments)    auras  . Pneumovax [Pneumococcal Polysaccharide Vaccine] Other (See Comments)    Local reaction  . Quinapril Hcl Other (See Comments)     "feelings of tiredness"  . Simvastatin Diarrhea  . Topamax [Topiramate] Itching  . Vit D-Vit E-Safflower Oil Other (See Comments)    Headaches    Past Medical History, Surgical history, Social history, and Family History were reviewed and updated.  Review of Systems: Review of Systems  Constitutional: Positive for malaise/fatigue.  HENT: Negative.   Eyes: Negative.   Respiratory: Negative.   Cardiovascular: Negative.   Gastrointestinal: Positive for heartburn.  Genitourinary: Negative.   Musculoskeletal: Negative.   Skin: Negative.   Neurological: Negative.   Endo/Heme/Allergies: Negative.   Psychiatric/Behavioral: Negative.       Physical Exam:  weight is 166 lb 6.4 oz (75.5 kg). Her temporal temperature is 96.9 F (36.1 C) (abnormal). Her blood pressure is 161/78 (abnormal) and her pulse is 62. Her respiration is 19 and oxygen saturation is 100%.   Wt Readings from Last 3 Encounters:  05/13/19 166 lb 6.4 oz (75.5 kg)  03/30/19 163 lb (73.9 kg)  02/16/19 160 lb (72.6 kg)    Physical Exam Vitals reviewed.  Constitutional:      Comments: Breast exam shows right breast with no masses, edema or erythema.  There is no right axillary adenopathy.  Left breast shows a well-healed lumpectomy at about the 3 o'clock position.  There is no left breast swelling.  There is no left breast tenderness.  There is no left  axillary adenopathy.  HENT:     Head: Normocephalic and atraumatic.  Eyes:     Pupils: Pupils are equal, round, and reactive to light.  Cardiovascular:     Rate and Rhythm: Normal rate and regular rhythm.     Heart sounds: Normal heart sounds.  Pulmonary:     Effort: Pulmonary effort is normal.     Breath sounds: Normal breath sounds.  Abdominal:     General: Bowel sounds are normal.     Palpations: Abdomen is soft.  Musculoskeletal:        General: No tenderness or deformity. Normal range of motion.     Cervical back: Normal range of motion.  Lymphadenopathy:     Cervical: No cervical adenopathy.  Skin:    General: Skin is warm and dry.     Findings: No erythema or rash.  Neurological:     Mental Status: She is alert and oriented to person, place, and time.  Psychiatric:        Behavior: Behavior normal.        Thought Content: Thought content normal.        Judgment: Judgment normal.      Lab Results  Component Value Date   WBC 8.1 05/13/2019   HGB 11.8 (L) 05/13/2019   HCT 41.1 05/13/2019   MCV 78.1 (L) 05/13/2019   PLT 685 (H) 05/13/2019   Lab Results  Component Value Date   FERRITIN 39 03/30/2019   IRON 20 (L) 03/30/2019   TIBC 469 (H) 03/30/2019   UIBC 449 (H) 03/30/2019    IRONPCTSAT 4 (L) 03/30/2019   Lab Results  Component Value Date   RETICCTPCT 0.8 02/02/2015   RBC 5.26 (H) 05/13/2019   RETICCTABS 41.4 02/02/2015   No results found for: KPAFRELGTCHN, LAMBDASER, KAPLAMBRATIO No results found for: IGGSERUM, IGA, IGMSERUM No results found for: Odetta Pink, SPEI   Chemistry      Component Value Date/Time   NA 138 05/13/2019 1256   NA 142 03/25/2017 1457   NA 139 04/10/2016 0855   K 4.5 05/13/2019 1256   K 4.1 03/25/2017 1457   K 4.2 04/10/2016 0855   CL 103 05/13/2019 1256   CL 96 (L) 03/25/2017 1457   CO2 30 05/13/2019 1256   CO2 32 03/25/2017 1457   CO2 29 04/10/2016 0855   BUN 8 05/13/2019 1256   BUN 7 03/25/2017 1457   BUN 10.0 04/10/2016 0855   CREATININE 0.90 05/13/2019 1256   CREATININE 1.0 03/25/2017 1457   CREATININE 0.8 04/10/2016 0855      Component Value Date/Time   CALCIUM 9.4 05/13/2019 1256   CALCIUM 9.8 03/25/2017 1457   CALCIUM 9.5 04/10/2016 0855   ALKPHOS 61 05/13/2019 1256   ALKPHOS 86 (H) 03/25/2017 1457   ALKPHOS 85 04/10/2016 0855   AST 14 (L) 05/13/2019 1256   AST 19 04/10/2016 0855   ALT 8 05/13/2019 1256   ALT 15 03/25/2017 1457   ALT 13 04/10/2016 0855   BILITOT 1.3 (H) 05/13/2019 1256   BILITOT 1.26 (H) 04/10/2016 0855       Impression and Plan: [name] is a very pleasant 85 yo caucasian female with polycythemia and history of stage I infiltrating ductal carcinoma of the left breast with lumpectomy in 2013.  She will think about the iron.  If she does not want iron, again, we will have to think about anagrelide.  I do think iron would be a  good idea for her.  She has had iron before.  We are going to have to get her back to see [place] in another month.  Again I cannot have her platelet count go up too much.  I spent about 30 minutes with her today.  We had to talk about how we could help manage the thrombocytosis.  Volanda Napoleon,  MD 12/30/20202:18 PM

## 2019-05-14 ENCOUNTER — Telehealth: Payer: Self-pay

## 2019-05-14 ENCOUNTER — Telehealth: Payer: Self-pay | Admitting: *Deleted

## 2019-05-14 LAB — IRON AND TIBC
Iron: 18 ug/dL — ABNORMAL LOW (ref 41–142)
Saturation Ratios: 4 % — ABNORMAL LOW (ref 21–57)
TIBC: 458 ug/dL — ABNORMAL HIGH (ref 236–444)
UIBC: 440 ug/dL — ABNORMAL HIGH (ref 120–384)

## 2019-05-14 LAB — FERRITIN: Ferritin: 27 ng/mL (ref 11–307)

## 2019-05-14 NOTE — Telephone Encounter (Signed)
As noted below by Dr. Marin Olp, I informed the patient of her iron level. Someone from scheduling will call to set up an appointment for one dose of IV iron. She verbalized understanding.

## 2019-05-14 NOTE — Telephone Encounter (Addendum)
Attempted to contact pt with attached message. Recording stating that pt has not set up VM received. Will continue to attempt. dph  ----- Message from Volanda Napoleon, MD sent at 05/14/2019 12:53 PM EST ----- Call - the iron is very very low.  1 dose of iron might help out a lot!!  [employer]

## 2019-05-14 NOTE — Progress Notes (Signed)
Attempted to contact pt; no VM set up. dph

## 2019-05-14 NOTE — Telephone Encounter (Signed)
-----   Message from Volanda Napoleon, MD sent at 05/14/2019 12:53 PM EST ----- Call - the iron is very very low.  1 dose of iron might help out a lot!!  [employer]

## 2019-05-18 ENCOUNTER — Inpatient Hospital Stay: Payer: Medicare Other | Attending: Hematology & Oncology

## 2019-05-18 ENCOUNTER — Other Ambulatory Visit: Payer: Self-pay

## 2019-05-18 ENCOUNTER — Telehealth: Payer: Self-pay | Admitting: Hematology & Oncology

## 2019-05-18 VITALS — BP 119/61 | HR 60 | Resp 17

## 2019-05-18 DIAGNOSIS — D751 Secondary polycythemia: Secondary | ICD-10-CM

## 2019-05-18 DIAGNOSIS — D509 Iron deficiency anemia, unspecified: Secondary | ICD-10-CM | POA: Insufficient documentation

## 2019-05-18 DIAGNOSIS — D500 Iron deficiency anemia secondary to blood loss (chronic): Secondary | ICD-10-CM

## 2019-05-18 MED ORDER — SODIUM CHLORIDE 0.9 % IV SOLN
510.0000 mg | Freq: Once | INTRAVENOUS | Status: AC
  Administered 2019-05-18: 510 mg via INTRAVENOUS
  Filled 2019-05-18: qty 17

## 2019-05-18 NOTE — Patient Instructions (Signed)

## 2019-05-18 NOTE — Telephone Encounter (Signed)
Tried calling patient 12/31 and NO VM has been set up.  Apt for 1/4 scheduled and will need to been r/s due to this fact.

## 2019-06-08 ENCOUNTER — Other Ambulatory Visit: Payer: Self-pay | Admitting: Internal Medicine

## 2019-06-12 ENCOUNTER — Ambulatory Visit: Payer: Medicare Other | Admitting: Hematology & Oncology

## 2019-06-12 ENCOUNTER — Inpatient Hospital Stay: Payer: Medicare Other

## 2019-06-19 ENCOUNTER — Telehealth: Payer: Self-pay

## 2019-06-19 DIAGNOSIS — F341 Dysthymic disorder: Secondary | ICD-10-CM

## 2019-06-19 MED ORDER — LORAZEPAM 0.5 MG PO TABS: ORAL_TABLET | ORAL | 1 refills | Status: DC

## 2019-06-19 NOTE — Telephone Encounter (Signed)
RF sent , PDMP okay

## 2019-06-19 NOTE — Telephone Encounter (Signed)
Patient needs Dr. Larose Kells to call in a refill for   LORazepam (ATIVAN) 0.5 MG tablet [id number]   Order Details Dose, Route, Frequency: As Directed  Dispense Quantity: 90 tablet Refills: 1       Sig: TAKE 1/2 TABLET BY MOUTH EVERY MORNING AND EVERY AFTERNOON. TAKE 2 TABLETS BY MOUTH AT BEDTIME AS NEEDED       Send Prescription to:    [facility] [id number] [address] AT [facility] [address]  [place], [place] [address]  Phone:  [phone] Fax:  [phone]    Thanks,

## 2019-06-19 NOTE — Telephone Encounter (Signed)
Lorazepam refill.   Last OV: 01/26/2019 Last Fill: 04/16/2019 #90 and 1RF Pt sig: 1/2 tab qAM and qPM. Then take 2 tabs at bedtime prn UDS: 05/19/2018 Low risk

## 2019-06-21 IMAGING — DX DG CHEST 2V
2 series · 2 of 2 positions shown · non-contrast
Comparison: Prior chest x-ray 07/26/2016

CLINICAL DATA: 87-year-old old female with cough, congestion and
shortness of breath

EXAM:
CHEST  2 VIEW

[chest pa]
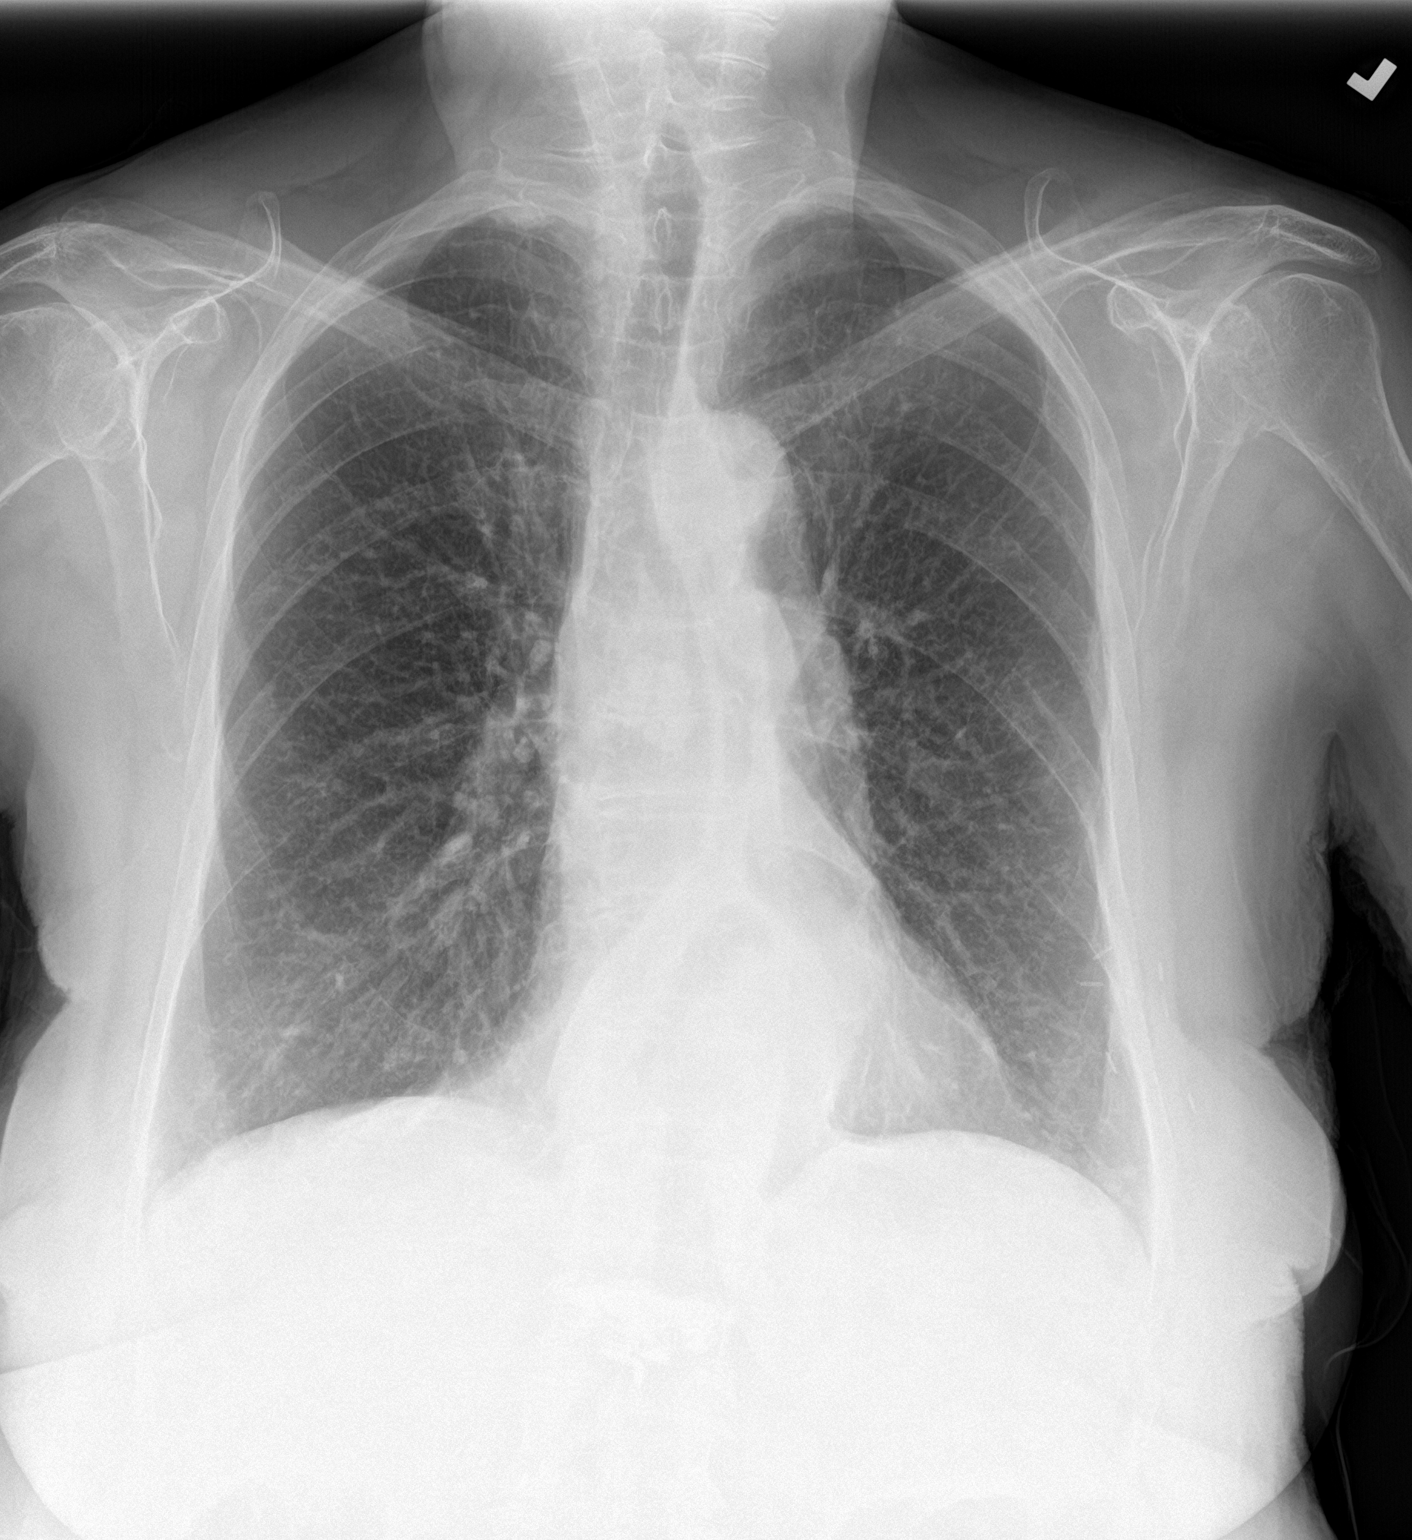

[chest lat]
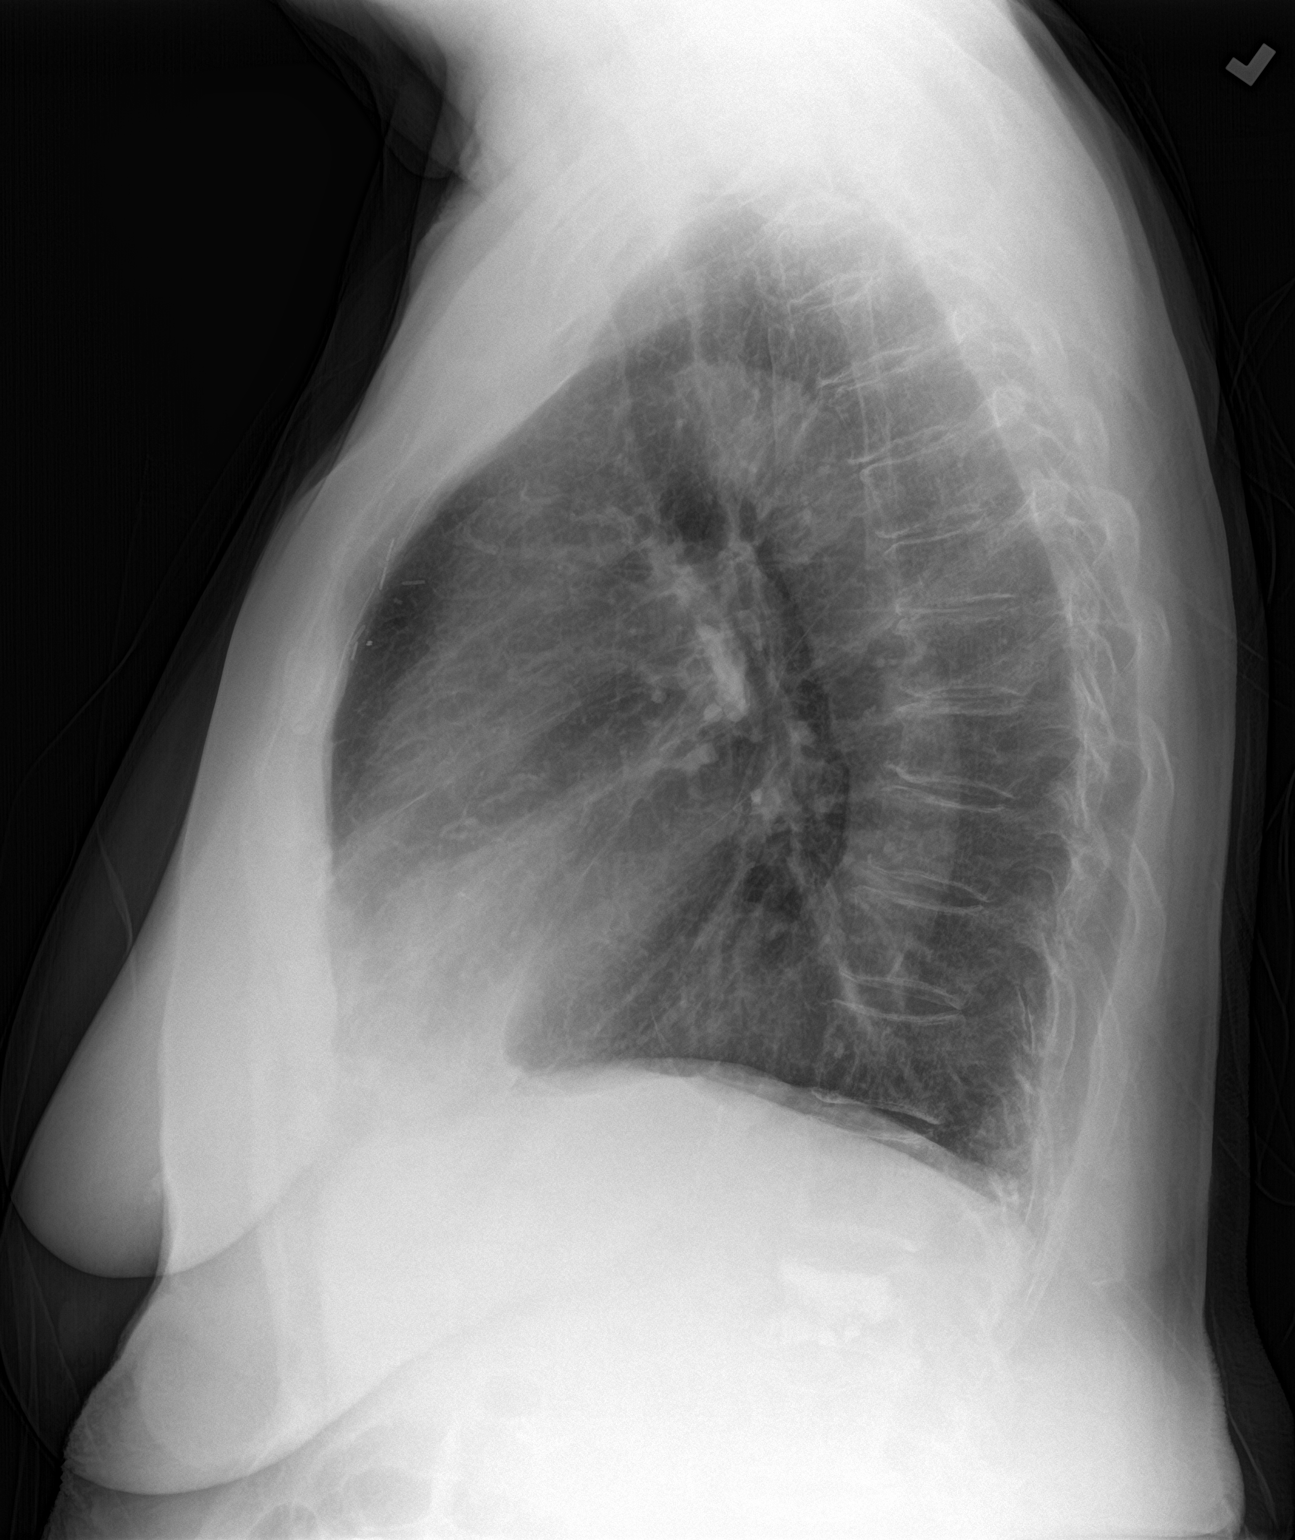

[2 of 2 positions shown; findings below may reference images not displayed]

FINDINGS: The cardiac and mediastinal contours are normal. Moderate hiatal
hernia. No airspace infiltrate, pleural effusion, pulmonary edema or
pneumothorax. No suspicious nodule or mass. Minimal chronic
bronchitic changes without interval progression. No acute osseous
abnormality. Stable L2 compression fracture with evidence of prior
cement augmentation.
IMPRESSION: No active cardiopulmonary disease.

## 2019-06-29 ENCOUNTER — Other Ambulatory Visit: Payer: Self-pay

## 2019-06-29 ENCOUNTER — Ambulatory Visit (INDEPENDENT_AMBULATORY_CARE_PROVIDER_SITE_OTHER): Payer: Medicare Other | Admitting: Internal Medicine

## 2019-06-29 ENCOUNTER — Encounter: Payer: Self-pay | Admitting: Internal Medicine

## 2019-06-29 VITALS — BP 157/73 | HR 59 | Temp 96.4°F | Resp 18 | Ht 66.0 in | Wt 164.4 lb

## 2019-06-29 DIAGNOSIS — M8949 Other hypertrophic osteoarthropathy, multiple sites: Secondary | ICD-10-CM | POA: Diagnosis not present

## 2019-06-29 DIAGNOSIS — Z79899 Other long term (current) drug therapy: Secondary | ICD-10-CM | POA: Diagnosis not present

## 2019-06-29 DIAGNOSIS — R079 Chest pain, unspecified: Secondary | ICD-10-CM | POA: Diagnosis not present

## 2019-06-29 DIAGNOSIS — I10 Essential (primary) hypertension: Secondary | ICD-10-CM | POA: Diagnosis not present

## 2019-06-29 DIAGNOSIS — F341 Dysthymic disorder: Secondary | ICD-10-CM

## 2019-06-29 DIAGNOSIS — M159 Polyosteoarthritis, unspecified: Secondary | ICD-10-CM

## 2019-06-29 MED FILL — VALACYCLOVIR HCL 500 MG TAB: 500 | 8 days supply | Qty: 30 | Fill #1

## 2019-06-29 NOTE — Progress Notes (Signed)
Pre visit review using our clinic review tool, if applicable. No additional management support is needed unless otherwise documented below in the visit note. 

## 2019-06-29 NOTE — Patient Instructions (Signed)
GO TO THE LAB : provide a urine sample     GO TO THE FRONT DESK Come back for a check up in 4 to 6 months , please make an appointment   Check the  blood pressure 2 or 3 times a week BP GOAL is between 110/65 and  135/85. If it is consistently higher or lower, let me know

## 2019-06-29 NOTE — Progress Notes (Signed)
Subjective:    Patient ID: [name], female    DOB: Sep 15, 1929, 86 y.o.   MRN: [id number]  DOS:  06/29/2019 Type of visit - description: Follow-up In general feeling well, we review her ambulatory BPs; GI symptoms improved, hematology notes also reviewed.  About 2 to 3 weeks ago she had a single episode of chest pain, noted as soon as she woke up, lasted 1.5 hours, no radiation, no worse by coughing or moving her torso. At the time BP was elevated at 196/114.  It was rechecked later on and BP normalized. There was no nausea, diaphoresis, shortness of breath, palpitations. Again no further episodes.  BP Readings from Last 3 Encounters:  06/29/19 (!) 157/73  05/18/19 119/61  05/13/19 (!) 161/78    Review of Systems See above   Past Medical History:  Diagnosis Date  . CAD (coronary artery disease)    mild nonobstructive disease on cath in 2003  . Cancer ([place])   . Cardiomyopathy    Probable Takotsubo, severe CP w/ normal cath in 1994. Severe CP in 2003 w/ widespread T wave inversions on ECG. Cath w/ minimal coronary disease but LV-gram showed periapical severe hypokinesis and basilar hyperkinesia (EF 40%). Last echo in 4/09 confirmed full LV functional recovery with EF 60%, no regional wall motion abnormalities, mild to moderate LVH.  Marland Kitchen CVA (cerebral infarction)   . Depression with anxiety 03-29-11   lost husband 3'09  . Diverticulitis   . DVT (deep venous thrombosis) ([place])    after venous ablation, R leg  . E. coli gastroenteritis 03-29-11   8'10  . Fracture 09/10/07   L2, status post vertebroplasty of L2 performed by IR  . GERD (gastroesophageal reflux disease)   . Headache(784.0)    migraines  . Hemorrhoids   . Hiatal hernia 03-29-11   no nerve problems  . Hyperlipidemia   . Hyperplastic colon polyp 06/2001  . Hypertension   . Iron deficiency anemia due to chronic blood loss 12/30/2017  . OA (osteoarthritis) of knee 03-29-11   w/ bilateral knee pain-not a  problem now  . OSA (obstructive sleep apnea) 03-29-11   no cpap used, not a problem now.  . Osteoporosis   . Otalgia of both ears    Dr. Simeon Craft  . Polycythemia   . Stroke [facility]) 03-29-11   CVA x2 -last 10'12-?TIA(visual problems)  . Varicose vein     Past Surgical History:  Procedure Laterality Date  . APPENDECTOMY    . BACK SURGERY    . BREAST BIOPSY    . BREAST CYST EXCISION    . BREAST LUMPECTOMY Left 2013   left stage I left breast cancer  . CHOLECYSTECTOMY  04/09/2011   Procedure: LAPAROSCOPIC CHOLECYSTECTOMY WITH INTRAOPERATIVE CHOLANGIOGRAM;  Surgeon: Odis Hollingshead, MD;  Location: [facility] ORS;  Service: General;  Laterality: N/A;  . Dental Extraction     L maxillary molar  . KYPHOSIS SURGERY  08/2007   cement used  . OVARIAN CYST SURGERY     left  . TONSILLECTOMY    . TOTAL KNEE ARTHROPLASTY  06/2010   left  . TUBAL LIGATION      Allergies as of 06/29/2019      Reactions   Penicillins Hives, Rash, Other (See Comments)   Has patient had a PCN reaction causing immediate rash, facial/tongue/throat swelling, SOB or lightheadedness with hypotension: Yes Has patient had a PCN reaction causing severe rash involving mucus membranes or skin necrosis: yes Has patient  had a PCN reaction that required hospitalization: No Has patient had a PCN reaction occurring within the last 10 years: no If all of the above answers are "NO", then may proceed with Cephalosporin use. Other Reaction: OTHER REACTION   Valsartan Itching, Other (See Comments)   Atorvastatin Diarrhea   Carvedilol Other (See Comments)   "teeth hurt when I took it"   Ezetimibe Nausea Only   Fluvastatin Sodium Other (See Comments)   "Can't remember"   Magnesium Hydroxide Other (See Comments)   REACTION: triggers HAs   Meloxicam Other (See Comments)   Pt seeing auro's / spots.     Omeprazole Magnesium Other (See Comments)   auras   Pneumovax [pneumococcal Polysaccharide Vaccine] Other (See Comments)   Local  reaction   Quinapril Hcl Other (See Comments)    "feelings of tiredness"   Simvastatin Diarrhea   Topamax [topiramate] Itching   Vit D-vit E-safflower Oil Other (See Comments)   Headaches      Medication List       Accurate as of June 29, 2019 11:59 PM. If you have any questions, ask your nurse or doctor.        acetaminophen-codeine 300-30 MG tablet Commonly known as: TYLENOL #3 Take 1 tablet by mouth every 8 (eight) hours as needed. For migraine   aspirin 81 MG tablet Take 81 mg by mouth daily.   bismuth subsalicylate [id number] [id number] suspension Commonly known as: PEPTO BISMOL Take 30 mLs by mouth every other day.   hydrochlorothiazide 25 MG tablet Commonly known as: HYDRODIURIL Take 1 tablet (25 mg total) by mouth daily as needed (swelling in the legs ).   hydroxyurea 500 MG capsule Commonly known as: HYDREA TAKE 1 CAPSULE BY MOUTH EVERY OTHER DAY TAKE WITH FOOD TO MINIMIZE SIDE EFFECT   LORazepam 0.5 MG tablet Commonly known as: ATIVAN TAKE 1/2 TABLET BY MOUTH EVERY MORNING AND EVERY AFTERNOON. THEN TAKE 2 TABLETS BY MOUTH AT BEDTIME AS NEEDED   metoprolol tartrate 50 MG tablet Commonly known as: LOPRESSOR Take 1 tablet (50 mg total) by mouth 2 (two) times daily.   mometasone 0.1 % cream Commonly known as: Elocon Apply 1 application topically daily.   omeprazole 20 MG capsule Commonly known as: PRILOSEC Take 20 mg by mouth daily.   valACYclovir 500 MG tablet Commonly known as: VALTREX Take 2 tablets at onset of rash and 2 tablets 12 hours later as needed.             Objective:   Physical Exam BP (!) 157/73 (BP Location: Left Arm, Patient Position: Sitting, Cuff Size: Small)   Pulse (!) 59   Temp (!) 96.4 F (35.8 C) (Temporal)   Resp 18   Ht 5\' 6"  (1.676 m)   Wt 164 lb 6 oz (74.6 kg)   SpO2 97%   BMI 26.53 kg/m  General:   Well developed, NAD, BMI noted.  HEENT:  Normocephalic . Face symmetric, atraumatic Lungs:  CTA B Normal  respiratory effort, no intercostal retractions, no accessory muscle use. Heart: RRR,  no murmur.  Abdomen:  Not distended, soft, non-tender. No rebound or rigidity.   Skin: Not pale. Not jaundice Lower extremities: no pretibial edema bilaterally  Neurologic:  alert & oriented X3.  Speech normal, gait appropriate for age and unassisted Psych--  Cognition and judgment appear intact.  Cooperative with normal attention span and concentration.  Behavior appropriate. No anxious or depressed appearing.     Assessment     Assessment  HTN Dyslipidemia: Intolerant to most medicines including Zetia, Pravachol, Niaspan Depression, anxiety , insomnia - on ativan bid (03-2015 citalopram cause drowsiness, 04-2015 prozac:diarrhea) Hem-Onc: POLYCYTEMIA: sees hem-onc H/o breast ca  MSK: ---DJD :  Tylenol #3, uses rarely ---Osteoporosis:  T score -2.8 (02-2013): declined treatment 06-2017 ---L2 fractures, s/p vertebral plasty 2009 Migraine headaches - Tylenol #3 (rarely)  GI: GERD- intolerant to prevacid, visual disturbances HH, diverticulitis, Colon polyps  h/o "functional " LLQ abd pain CV: ---Cardiomyopathy Probable Takotsubo cardiomyopathy: CP w/ normal cath in 1994.   Severe CP  2003 with widespread T wave inversions on ECG.  Cath with minimal coronary disease but LV-gram showed periapical severe hypokinesis and basilar hyperkinesia (EF 40%) >>> ECHO 4/09 confirmed full LV functional recovery with EF 60%, mild LVH. 2012: Nl EF per echo, (-) Lexiscan 05/21/18 ECHO Nl/stable ---Stroke / TIA 2012 Sleep apnea 2012, , not on CPAP H/oDVT, right leg after venous ablation Raynaud phenomena, DX 05/2018  PLAN HTN:BP today modestly elevated, at home reportedly normal.  Continue HCTZ, metoprolol, last BMP satisfactory Depression, anxiety, insomnia: On Ativan.  Check a UDS.  Overall symptoms controlled. Polycythemia: Closely follow-up by hematology.  Has developed iron deficiency.  States he is not  taking Hydrea Chest pain: As described above, single episode, she strongly declined EKG or further eval.  I advised her to call 911 if symptoms resurface. Functional LLQ abdominal pain: Reports Pepto-Bismol is actually helping.  Not an issue at this point. Encouraged to get the Covid immunization whenever is available to her RTC 4 to 6 months   This visit occurred during the SARS-CoV-2 public health emergency.  Safety protocols were in place, including screening questions prior to the visit, additional usage of staff PPE, and extensive cleaning of exam room while observing appropriate contact time as indicated for disinfecting solutions.

## 2019-07-01 LAB — PAIN MGMT, PROFILE 8 W/CONF, U
6 Acetylmorphine: NEGATIVE ng/mL
Alcohol Metabolites: NEGATIVE ng/mL (ref <500)
Alphahydroxyalprazolam: NEGATIVE ng/mL
Alphahydroxymidazolam: NEGATIVE ng/mL
Alphahydroxytriazolam: NEGATIVE ng/mL
Aminoclonazepam: NEGATIVE ng/mL
Amphetamines: NEGATIVE ng/mL
Benzodiazepines: POSITIVE ng/mL
Buprenorphine, Urine: NEGATIVE ng/mL
Cocaine Metabolite: NEGATIVE ng/mL
Creatinine: 129.5 mg/dL
Hydroxyethylflurazepam: NEGATIVE ng/mL
Lorazepam: 1250 ng/mL
MDMA: NEGATIVE ng/mL
Marijuana Metabolite: NEGATIVE ng/mL
Nordiazepam: NEGATIVE ng/mL
Opiates: NEGATIVE ng/mL
Oxazepam: NEGATIVE ng/mL
Oxidant: NEGATIVE ug/mL
Oxycodone: NEGATIVE ng/mL
Temazepam: NEGATIVE ng/mL
pH: 6.7 (ref 4.5–9.0)

## 2019-07-01 NOTE — Assessment & Plan Note (Signed)
HTN:BP today modestly elevated, at home reportedly normal.  Continue HCTZ, metoprolol, last BMP satisfactory Depression, anxiety, insomnia: On Ativan.  Check a UDS.  Overall symptoms controlled. Polycythemia: Closely follow-up by hematology.  Has developed iron deficiency.  States he is not taking Hydrea Chest pain: As described above, single episode, she strongly declined EKG or further eval.  I advised her to call 911 if symptoms resurface. Functional LLQ abdominal pain: Reports Pepto-Bismol is actually helping.  Not an issue at this point. Encouraged to get the Covid immunization whenever is available to her RTC 4 to 6 months

## 2019-07-13 ENCOUNTER — Telehealth: Payer: Self-pay | Admitting: Internal Medicine

## 2019-07-13 NOTE — Telephone Encounter (Signed)
Tylenol #3 refill request.   Last OV: 06/29/2019  Last Fill: 01/26/2019 #20 and 0RF Pt sig: 1 tab q8h prn UDS: 06/29/2019 Low risk

## 2019-07-13 NOTE — Telephone Encounter (Signed)
Rx sent 

## 2019-07-16 ENCOUNTER — Inpatient Hospital Stay (HOSPITAL_BASED_OUTPATIENT_CLINIC_OR_DEPARTMENT_OTHER): Payer: Medicare Other | Admitting: Hematology & Oncology

## 2019-07-16 ENCOUNTER — Other Ambulatory Visit: Payer: Self-pay

## 2019-07-16 ENCOUNTER — Inpatient Hospital Stay: Payer: Medicare Other

## 2019-07-16 ENCOUNTER — Encounter: Payer: Self-pay | Admitting: Hematology & Oncology

## 2019-07-16 ENCOUNTER — Inpatient Hospital Stay: Payer: Medicare Other | Attending: Hematology & Oncology

## 2019-07-16 VITALS — BP 142/54 | HR 58

## 2019-07-16 VITALS — BP 132/63 | HR 66 | Temp 96.9°F | Resp 19 | Wt 165.0 lb

## 2019-07-16 DIAGNOSIS — D500 Iron deficiency anemia secondary to blood loss (chronic): Secondary | ICD-10-CM

## 2019-07-16 DIAGNOSIS — D45 Polycythemia vera: Secondary | ICD-10-CM | POA: Diagnosis not present

## 2019-07-16 DIAGNOSIS — Z79899 Other long term (current) drug therapy: Secondary | ICD-10-CM | POA: Diagnosis not present

## 2019-07-16 DIAGNOSIS — D751 Secondary polycythemia: Secondary | ICD-10-CM

## 2019-07-16 DIAGNOSIS — D509 Iron deficiency anemia, unspecified: Secondary | ICD-10-CM | POA: Diagnosis not present

## 2019-07-16 DIAGNOSIS — C50912 Malignant neoplasm of unspecified site of left female breast: Secondary | ICD-10-CM | POA: Insufficient documentation

## 2019-07-16 LAB — CBC WITH DIFFERENTIAL (CANCER CENTER ONLY)
Abs Immature Granulocytes: 0.04 10*3/uL (ref 0.00–0.07)
Basophils Absolute: 0.1 10*3/uL (ref 0.0–0.1)
Basophils Relative: 1 %
Eosinophils Absolute: 0.2 10*3/uL (ref 0.0–0.5)
Eosinophils Relative: 3 %
HCT: 48.8 % — ABNORMAL HIGH (ref 36.0–46.0)
Hemoglobin: 14.3 g/dL (ref 12.0–15.0)
Immature Granulocytes: 1 %
Lymphocytes Relative: 15 %
Lymphs Abs: 1.2 10*3/uL (ref 0.7–4.0)
MCH: 24 pg — ABNORMAL LOW (ref 26.0–34.0)
MCHC: 29.3 g/dL — ABNORMAL LOW (ref 30.0–36.0)
MCV: 81.9 fL (ref 80.0–100.0)
Monocytes Absolute: 0.7 10*3/uL (ref 0.1–1.0)
Monocytes Relative: 9 %
Neutro Abs: 5.7 10*3/uL (ref 1.7–7.7)
Neutrophils Relative %: 71 %
Platelet Count: 689 10*3/uL — ABNORMAL HIGH (ref 150–400)
RBC: 5.96 MIL/uL — ABNORMAL HIGH (ref 3.87–5.11)
RDW: 21.6 % — ABNORMAL HIGH (ref 11.5–15.5)
WBC Count: 7.9 10*3/uL (ref 4.0–10.5)
nRBC: 0 % (ref 0.0–0.2)

## 2019-07-16 LAB — CMP (CANCER CENTER ONLY)
ALT: 8 U/L (ref 0–44)
AST: 13 U/L — ABNORMAL LOW (ref 15–41)
Albumin: 4.2 g/dL (ref 3.5–5.0)
Alkaline Phosphatase: 67 U/L (ref 38–126)
Anion gap: 5 (ref 5–15)
BUN: 8 mg/dL (ref 8–23)
CO2: 31 mmol/L (ref 22–32)
Calcium: 9.8 mg/dL (ref 8.9–10.3)
Chloride: 102 mmol/L (ref 98–111)
Creatinine: 0.82 mg/dL (ref 0.44–1.00)
GFR, Est AFR Am: >60 mL/min (ref >60)
GFR, Estimated: >60 mL/min (ref >60)
Glucose, Bld: 145 mg/dL — ABNORMAL HIGH (ref 70–99)
Potassium: 5.1 mmol/L (ref 3.5–5.1)
Sodium: 138 mmol/L (ref 135–145)
Total Bilirubin: 1.1 mg/dL (ref 0.3–1.2)
Total Protein: 6.7 g/dL (ref 6.5–8.1)

## 2019-07-16 LAB — LACTATE DEHYDROGENASE: LDH: 211 U/L — ABNORMAL HIGH (ref 98–192)

## 2019-07-16 LAB — SAVE SMEAR(SSMR), FOR PROVIDER SLIDE REVIEW

## 2019-07-16 MED ORDER — SODIUM CHLORIDE 0.9 % IV SOLN
Freq: Once | INTRAVENOUS | Status: DC
  Filled 2019-07-16: qty 250

## 2019-07-16 NOTE — Progress Notes (Signed)
[name] presents today for phlebotomy per MD orders. Phlebotomy procedure started at 1410 and ended at 1430. 500 cc removed via 20 G needle at R antecubital site. IV replacement fluids given through the same IV per Dr. Marin Olp. Patient tolerated procedure well. IV needle removed intact.

## 2019-07-16 NOTE — Progress Notes (Signed)
Hematology and Oncology Follow Up Visit  Ettamae Shepperd [id number] 05-07-1930 86 y.o. 07/16/2019   Principle Diagnosis:  Polycythemia vera - JAK2 positive Stage I (T1bN0M0 ) infiltrating duct carcinoma the left breast  Iron deficiency anemia -- symptomatic  Past Therapy: Femara 2.5 mg by mouth daily - stopped 08/2015  Current Therapy:   Phlebotomy to maintain hematocrit below 45% Hydrea 500 mg by mouth every other day -- re-start on 07/16/2019 IV Iron as needed --    Interim History:  Ms. Bowling is here today for follow-up.  Her platelet count is still a big problem.  She did go ahead and have some IV iron back in January.  This really did not help her platelet count.  Today, her platelet count is 689,000.  It is clear that the Hydrea that she was taking was the real impetus for causing her platelet count to normalize.  We are going to have to try to get her back on Hydrea.  She is willing to try it again.  We will have her restart Hydrea at 500 mg p.o. every other day.  She has had no problems with headache.  She has had no rashes.  She has had no change in bowel or bladder habits.  She has had no nausea or vomiting.  There is been no fever.  She has had no cough.  Overall, I would say her performance status is ECOG 2.  Her 89th birthday is coming up in a couple weeks.      Medications:  Allergies as of 07/16/2019      Reactions   Penicillins Hives, Rash, Other (See Comments)   Has patient had a PCN reaction causing immediate rash, facial/tongue/throat swelling, SOB or lightheadedness with hypotension: Yes Has patient had a PCN reaction causing severe rash involving mucus membranes or skin necrosis: yes Has patient had a PCN reaction that required hospitalization: No Has patient had a PCN reaction occurring within the last 10 years: no If all of the above answers are "NO", then may proceed with Cephalosporin use. Other Reaction: OTHER REACTION   Valsartan Itching,  Other (See Comments)   Atorvastatin Diarrhea   Carvedilol Other (See Comments)   "teeth hurt when I took it"   Ezetimibe Nausea Only   Fluvastatin Sodium Other (See Comments)   "Can't remember"   Magnesium Hydroxide Other (See Comments)   REACTION: triggers HAs   Meloxicam Other (See Comments)   Pt seeing auro's / spots.     Omeprazole Magnesium Other (See Comments)   auras   Pneumovax [pneumococcal Polysaccharide Vaccine] Other (See Comments)   Local reaction   Quinapril Hcl Other (See Comments)    "feelings of tiredness"   Simvastatin Diarrhea   Topamax [topiramate] Itching   Vit D-vit E-safflower Oil Other (See Comments)   Headaches      Medication List       Accurate as of July 16, 2019  1:37 PM. If you have any questions, ask your nurse or doctor.        STOP taking these medications   hydroxyurea 500 MG capsule Commonly known as: HYDREA Stopped by: Volanda Napoleon, MD     TAKE these medications   acetaminophen-codeine 300-30 MG tablet Commonly known as: TYLENOL #3 TAKE 1 TABLET BY MOUTH EVERY 8 HOURS AS NEEDED FOR MIGRAINE   aspirin 81 MG tablet Take 81 mg by mouth daily.   bismuth subsalicylate 99991111 [id number] suspension Commonly known as: PEPTO BISMOL Take 30 mLs  by mouth every other day.   hydrochlorothiazide 25 MG tablet Commonly known as: HYDRODIURIL Take 1 tablet (25 mg total) by mouth daily as needed (swelling in the legs ).   LORazepam 0.5 MG tablet Commonly known as: ATIVAN TAKE 1/2 TABLET BY MOUTH EVERY MORNING AND EVERY AFTERNOON. THEN TAKE 2 TABLETS BY MOUTH AT BEDTIME AS NEEDED   metoprolol tartrate 50 MG tablet Commonly known as: LOPRESSOR Take 1 tablet (50 mg total) by mouth 2 (two) times daily.   mometasone 0.1 % cream Commonly known as: Elocon Apply 1 application topically daily.   omeprazole 20 MG capsule Commonly known as: PRILOSEC Take 20 mg by mouth daily.   valACYclovir 500 MG tablet Commonly known as: VALTREX Take 2  tablets at onset of rash and 2 tablets 12 hours later as needed.       Allergies:  Allergies  Allergen Reactions  . Penicillins Hives, Rash and Other (See Comments)    Has patient had a PCN reaction causing immediate rash, facial/tongue/throat swelling, SOB or lightheadedness with hypotension: Yes Has patient had a PCN reaction causing severe rash involving mucus membranes or skin necrosis: yes Has patient had a PCN reaction that required hospitalization: No Has patient had a PCN reaction occurring within the last 10 years: no If all of the above answers are "NO", then may proceed with Cephalosporin use.  Other Reaction: OTHER REACTION  . Valsartan Itching and Other (See Comments)  . Atorvastatin Diarrhea  . Carvedilol Other (See Comments)    "teeth hurt when I took it"   . Ezetimibe Nausea Only  . Fluvastatin Sodium Other (See Comments)    "Can't remember"  . Magnesium Hydroxide Other (See Comments)    REACTION: triggers HAs  . Meloxicam Other (See Comments)    Pt seeing auro's / spots.    . Omeprazole Magnesium Other (See Comments)    auras  . Pneumovax [Pneumococcal Polysaccharide Vaccine] Other (See Comments)    Local reaction  . Quinapril Hcl Other (See Comments)     "feelings of tiredness"  . Simvastatin Diarrhea  . Topamax [Topiramate] Itching  . Vit D-Vit E-Safflower Oil Other (See Comments)    Headaches    Past Medical History, Surgical history, Social history, and Family History were reviewed and updated.  Review of Systems: Review of Systems  Constitutional: Positive for malaise/fatigue.  HENT: Negative.   Eyes: Negative.   Respiratory: Negative.   Cardiovascular: Negative.   Gastrointestinal: Positive for heartburn.  Genitourinary: Negative.   Musculoskeletal: Negative.   Skin: Negative.   Neurological: Negative.   Endo/Heme/Allergies: Negative.   Psychiatric/Behavioral: Negative.      Physical Exam:  weight is 165 lb (74.8 kg). Her temporal  temperature is 96.9 F (36.1 C) (abnormal). Her blood pressure is 132/63 and her pulse is 66. Her respiration is 19 and oxygen saturation is 97%.   Wt Readings from Last 3 Encounters:  07/16/19 165 lb (74.8 kg)  06/29/19 164 lb 6 oz (74.6 kg)  05/13/19 166 lb 6.4 oz (75.5 kg)    Physical Exam Vitals reviewed.  Constitutional:      Comments: Breast exam shows right breast with no masses, edema or erythema.  There is no right axillary adenopathy.  Left breast shows a well-healed lumpectomy at about the 3 o'clock position.  There is no left breast swelling.  There is no left breast tenderness.  There is no left axillary adenopathy.  HENT:     Head: Normocephalic and atraumatic.  Eyes:  Pupils: Pupils are equal, round, and reactive to light.  Cardiovascular:     Rate and Rhythm: Normal rate and regular rhythm.     Heart sounds: Normal heart sounds.  Pulmonary:     Effort: Pulmonary effort is normal.     Breath sounds: Normal breath sounds.  Abdominal:     General: Bowel sounds are normal.     Palpations: Abdomen is soft.  Musculoskeletal:        General: No tenderness or deformity. Normal range of motion.     Cervical back: Normal range of motion.  Lymphadenopathy:     Cervical: No cervical adenopathy.  Skin:    General: Skin is warm and dry.     Findings: No erythema or rash.  Neurological:     Mental Status: She is alert and oriented to person, place, and time.  Psychiatric:        Behavior: Behavior normal.        Thought Content: Thought content normal.        Judgment: Judgment normal.      Lab Results  Component Value Date   WBC 7.9 07/16/2019   HGB 14.3 07/16/2019   HCT 48.8 (H) 07/16/2019   MCV 81.9 07/16/2019   PLT 689 (H) 07/16/2019   Lab Results  Component Value Date   FERRITIN 27 05/13/2019   IRON 18 (L) 05/13/2019   TIBC 458 (H) 05/13/2019   UIBC 440 (H) 05/13/2019   IRONPCTSAT 4 (L) 05/13/2019   Lab Results  Component Value Date   RETICCTPCT  0.8 02/02/2015   RBC 5.96 (H) 07/16/2019   RETICCTABS 41.4 02/02/2015   No results found for: KPAFRELGTCHN, LAMBDASER, KAPLAMBRATIO No results found for: IGGSERUM, IGA, IGMSERUM No results found for: Odetta Pink, SPEI   Chemistry      Component Value Date/Time   NA 138 07/16/2019 1249   NA 142 03/25/2017 1457   NA 139 04/10/2016 0855   K 5.1 07/16/2019 1249   K 4.1 03/25/2017 1457   K 4.2 04/10/2016 0855   CL 102 07/16/2019 1249   CL 96 (L) 03/25/2017 1457   CO2 31 07/16/2019 1249   CO2 32 03/25/2017 1457   CO2 29 04/10/2016 0855   BUN 8 07/16/2019 1249   BUN 7 03/25/2017 1457   BUN 10.0 04/10/2016 0855   CREATININE 0.82 07/16/2019 1249   CREATININE 1.0 03/25/2017 1457   CREATININE 0.8 04/10/2016 0855      Component Value Date/Time   CALCIUM 9.8 07/16/2019 1249   CALCIUM 9.8 03/25/2017 1457   CALCIUM 9.5 04/10/2016 0855   ALKPHOS 67 07/16/2019 1249   ALKPHOS 86 (H) 03/25/2017 1457   ALKPHOS 85 04/10/2016 0855   AST 13 (L) 07/16/2019 1249   AST 19 04/10/2016 0855   ALT 8 07/16/2019 1249   ALT 15 03/25/2017 1457   ALT 13 04/10/2016 0855   BILITOT 1.1 07/16/2019 1249   BILITOT 1.26 (H) 04/10/2016 0855       Impression and Plan: Ms. Gokhale is a very pleasant 86 yo caucasian female with polycythemia and history of stage I infiltrating ductal carcinoma of the left breast with lumpectomy in 2013.  Hopefully, the Hydrea will be able to get her platelet count under control again.  I think that it should be able to do that.  I would have to follow her a little bit more closely now.  We can have to get her back in 1 month  and see how her platelet count is responding.  She will need to be phlebotomized today.    Volanda Napoleon, MD 3/4/20211:37 PM

## 2019-07-16 NOTE — Patient Instructions (Signed)

## 2019-07-17 LAB — IRON AND TIBC
Iron: 24 ug/dL — ABNORMAL LOW (ref 41–142)
Saturation Ratios: 6 % — ABNORMAL LOW (ref 21–57)
TIBC: 404 ug/dL (ref 236–444)
UIBC: 379 ug/dL (ref 120–384)

## 2019-07-17 LAB — FERRITIN: Ferritin: 34 ng/mL (ref 11–307)

## 2019-07-22 ENCOUNTER — Telehealth: Payer: Self-pay | Admitting: Hematology & Oncology

## 2019-07-22 NOTE — Telephone Encounter (Signed)
Appointments scheduled calendar mailed per 3/4 los

## 2019-08-10 ENCOUNTER — Inpatient Hospital Stay (HOSPITAL_BASED_OUTPATIENT_CLINIC_OR_DEPARTMENT_OTHER): Payer: Medicare Other | Admitting: Hematology & Oncology

## 2019-08-10 ENCOUNTER — Telehealth: Payer: Self-pay | Admitting: Hematology & Oncology

## 2019-08-10 ENCOUNTER — Inpatient Hospital Stay: Payer: Medicare Other

## 2019-08-10 ENCOUNTER — Other Ambulatory Visit: Payer: Self-pay

## 2019-08-10 ENCOUNTER — Encounter: Payer: Self-pay | Admitting: Hematology & Oncology

## 2019-08-10 VITALS — BP 169/67 | HR 63 | Temp 96.9°F | Resp 19 | Wt 164.0 lb

## 2019-08-10 VITALS — BP 142/53 | HR 61

## 2019-08-10 DIAGNOSIS — D500 Iron deficiency anemia secondary to blood loss (chronic): Secondary | ICD-10-CM

## 2019-08-10 DIAGNOSIS — Z79899 Other long term (current) drug therapy: Secondary | ICD-10-CM | POA: Diagnosis not present

## 2019-08-10 DIAGNOSIS — D509 Iron deficiency anemia, unspecified: Secondary | ICD-10-CM | POA: Diagnosis not present

## 2019-08-10 DIAGNOSIS — D751 Secondary polycythemia: Secondary | ICD-10-CM

## 2019-08-10 DIAGNOSIS — D45 Polycythemia vera: Secondary | ICD-10-CM | POA: Diagnosis not present

## 2019-08-10 DIAGNOSIS — C50912 Malignant neoplasm of unspecified site of left female breast: Secondary | ICD-10-CM | POA: Diagnosis not present

## 2019-08-10 LAB — CBC WITH DIFFERENTIAL (CANCER CENTER ONLY)
Abs Immature Granulocytes: 0.03 10*3/uL (ref 0.00–0.07)
Basophils Absolute: 0.1 10*3/uL (ref 0.0–0.1)
Basophils Relative: 2 %
Eosinophils Absolute: 0.2 10*3/uL (ref 0.0–0.5)
Eosinophils Relative: 3 %
HCT: 45.8 % (ref 36.0–46.0)
Hemoglobin: 13.8 g/dL (ref 12.0–15.0)
Immature Granulocytes: 0 %
Lymphocytes Relative: 13 %
Lymphs Abs: 1 10*3/uL (ref 0.7–4.0)
MCH: 23.8 pg — ABNORMAL LOW (ref 26.0–34.0)
MCHC: 30.1 g/dL (ref 30.0–36.0)
MCV: 79.1 fL — ABNORMAL LOW (ref 80.0–100.0)
Monocytes Absolute: 0.6 10*3/uL (ref 0.1–1.0)
Monocytes Relative: 8 %
Neutro Abs: 5.5 10*3/uL (ref 1.7–7.7)
Neutrophils Relative %: 74 %
Platelet Count: 425 10*3/uL — ABNORMAL HIGH (ref 150–400)
RBC: 5.79 MIL/uL — ABNORMAL HIGH (ref 3.87–5.11)
RDW: 19.2 % — ABNORMAL HIGH (ref 11.5–15.5)
WBC Count: 7.4 10*3/uL (ref 4.0–10.5)
nRBC: 0 % (ref 0.0–0.2)

## 2019-08-10 LAB — SAVE SMEAR(SSMR), FOR PROVIDER SLIDE REVIEW

## 2019-08-10 LAB — CMP (CANCER CENTER ONLY)
ALT: 8 U/L (ref 0–44)
AST: 15 U/L (ref 15–41)
Albumin: 4.3 g/dL (ref 3.5–5.0)
Alkaline Phosphatase: 67 U/L (ref 38–126)
Anion gap: 4 — ABNORMAL LOW (ref 5–15)
BUN: 10 mg/dL (ref 8–23)
CO2: 33 mmol/L — ABNORMAL HIGH (ref 22–32)
Calcium: 9.7 mg/dL (ref 8.9–10.3)
Chloride: 100 mmol/L (ref 98–111)
Creatinine: 0.83 mg/dL (ref 0.44–1.00)
GFR, Est AFR Am: >60 mL/min (ref >60)
GFR, Estimated: >60 mL/min (ref >60)
Glucose, Bld: 114 mg/dL — ABNORMAL HIGH (ref 70–99)
Potassium: 4.5 mmol/L (ref 3.5–5.1)
Sodium: 137 mmol/L (ref 135–145)
Total Bilirubin: 1.5 mg/dL — ABNORMAL HIGH (ref 0.3–1.2)
Total Protein: 7 g/dL (ref 6.5–8.1)

## 2019-08-10 LAB — FERRITIN: Ferritin: 21 ng/mL (ref 11–307)

## 2019-08-10 LAB — LACTATE DEHYDROGENASE: LDH: 137 U/L (ref 98–192)

## 2019-08-10 MED ORDER — SODIUM CHLORIDE 0.9 % IV SOLN
INTRAVENOUS | Status: DC
  Filled 2019-08-10: qty 250

## 2019-08-10 MED ORDER — SODIUM CHLORIDE 0.9 % IV SOLN
510.0000 mg | Freq: Once | INTRAVENOUS | Status: AC
  Administered 2019-08-10: 510 mg via INTRAVENOUS
  Filled 2019-08-10: qty 510

## 2019-08-10 NOTE — Progress Notes (Signed)
Hematology and Oncology Follow Up Visit  [name] [id number] 10/22/1929 86 y.o. 08/10/2019   Principle Diagnosis:  Polycythemia vera - JAK2 positive Stage I (T1bN0M0 ) infiltrating duct carcinoma the left breast  Iron deficiency anemia -- symptomatic  Past Therapy: Femara 2.5 mg by mouth daily - stopped 08/2015  Current Therapy:   Phlebotomy to maintain hematocrit below 45% Hydrea 500 mg by mouth every other day -- re-start on 07/16/2019 IV Iron as needed -- last dose given on 05/18/2019   Interim History:  [name] is here today for follow-up.  Overall, she has responded very nicely to the Hydrea.  She has had no problems taking it.  She is taking it every other day.  The platelet count went down to 425,000.  Her problem is that she is feeling more tired.  I am sure that this is from lack of iron.  Back in 3 weeks ago, her iron saturation was only 6%.  I do think that we will have to give her some IV iron today, if possible.  She did have her 89th birthday last week.  She really enjoyed it.  She was with her family.  She had cupcakes.  She has had no fever.  She has had no nausea or vomiting.  There is been no change in bowel or bladder habits.  I think she probably has some poor circulation in her feet.  I told her that her family doctor can help with this situation.  Currently, her performance status is ECOG 2.  Medications:  Allergies as of 08/10/2019      Reactions   Penicillins Hives, Rash, Other (See Comments)   Has patient had a PCN reaction causing immediate rash, facial/tongue/throat swelling, SOB or lightheadedness with hypotension: Yes Has patient had a PCN reaction causing severe rash involving mucus membranes or skin necrosis: yes Has patient had a PCN reaction that required hospitalization: No Has patient had a PCN reaction occurring within the last 10 years: no If all of the above answers are "NO", then may proceed with Cephalosporin  use. Other Reaction: OTHER REACTION   Valsartan Itching, Other (See Comments)   Atorvastatin Diarrhea   Carvedilol Other (See Comments)   "teeth hurt when I took it"   Ezetimibe Nausea Only   Fluvastatin Sodium Other (See Comments)   "Can't remember"   Magnesium Hydroxide Other (See Comments)   REACTION: triggers HAs   Meloxicam Other (See Comments)   Pt seeing auro's / spots.     Omeprazole Magnesium Other (See Comments)   auras   Pneumovax [pneumococcal Polysaccharide Vaccine] Other (See Comments)   Local reaction   Quinapril Hcl Other (See Comments)    "feelings of tiredness"   Simvastatin Diarrhea   Topamax [topiramate] Itching   Vit D-vit E-safflower Oil Other (See Comments)   Headaches      Medication List       Accurate as of August 10, 2019  1:01 PM. If you have any questions, ask your nurse or doctor.        acetaminophen-codeine 300-30 MG tablet Commonly known as: TYLENOL #3 TAKE 1 TABLET BY MOUTH EVERY 8 HOURS AS NEEDED FOR MIGRAINE   aspirin 81 MG tablet Take 81 mg by mouth daily.   bismuth subsalicylate [id number] suspension Commonly known as: PEPTO BISMOL Take 30 mLs by mouth every other day.   hydrochlorothiazide 25 MG tablet Commonly known as: HYDRODIURIL Take 1 tablet (25 mg total) by mouth daily as needed (  swelling in the legs ).   LORazepam 0.5 MG tablet Commonly known as: ATIVAN TAKE 1/2 TABLET BY MOUTH EVERY MORNING AND EVERY AFTERNOON. THEN TAKE 2 TABLETS BY MOUTH AT BEDTIME AS NEEDED   metoprolol tartrate 50 MG tablet Commonly known as: LOPRESSOR Take 1 tablet (50 mg total) by mouth 2 (two) times daily.   mometasone 0.1 % cream Commonly known as: Elocon Apply 1 application topically daily.   omeprazole 20 MG capsule Commonly known as: PRILOSEC Take 20 mg by mouth daily.   valACYclovir 500 MG tablet Commonly known as: VALTREX Take 2 tablets at onset of rash and 2 tablets 12 hours later as needed.       Allergies:  Allergies   Allergen Reactions  . Penicillins Hives, Rash and Other (See Comments)    Has patient had a PCN reaction causing immediate rash, facial/tongue/throat swelling, SOB or lightheadedness with hypotension: Yes Has patient had a PCN reaction causing severe rash involving mucus membranes or skin necrosis: yes Has patient had a PCN reaction that required hospitalization: No Has patient had a PCN reaction occurring within the last 10 years: no If all of the above answers are "NO", then may proceed with Cephalosporin use.  Other Reaction: OTHER REACTION  . Valsartan Itching and Other (See Comments)  . Atorvastatin Diarrhea  . Carvedilol Other (See Comments)    "teeth hurt when I took it"   . Ezetimibe Nausea Only  . Fluvastatin Sodium Other (See Comments)    "Can't remember"  . Magnesium Hydroxide Other (See Comments)    REACTION: triggers HAs  . Meloxicam Other (See Comments)    Pt seeing auro's / spots.    . Omeprazole Magnesium Other (See Comments)    auras  . Pneumovax [Pneumococcal Polysaccharide Vaccine] Other (See Comments)    Local reaction  . Quinapril Hcl Other (See Comments)     "feelings of tiredness"  . Simvastatin Diarrhea  . Topamax [Topiramate] Itching  . Vit D-Vit E-Safflower Oil Other (See Comments)    Headaches    Past Medical History, Surgical history, Social history, and Family History were reviewed and updated.  Review of Systems: Review of Systems  Constitutional: Positive for malaise/fatigue.  HENT: Negative.   Eyes: Negative.   Respiratory: Negative.   Cardiovascular: Negative.   Gastrointestinal: Positive for heartburn.  Genitourinary: Negative.   Musculoskeletal: Negative.   Skin: Negative.   Neurological: Negative.   Endo/Heme/Allergies: Negative.   Psychiatric/Behavioral: Negative.      Physical Exam:  weight is 164 lb (74.4 kg). Her temperature is 96.9 F (36.1 C) (abnormal). Her blood pressure is 169/67 (abnormal) and her pulse is 63. Her  respiration is 19 and oxygen saturation is 96%.   Wt Readings from Last 3 Encounters:  08/10/19 164 lb (74.4 kg)  07/16/19 165 lb (74.8 kg)  06/29/19 164 lb 6 oz (74.6 kg)    Physical Exam Vitals reviewed.  Constitutional:      Comments: Breast exam shows right breast with no masses, edema or erythema.  There is no right axillary adenopathy.  Left breast shows a well-healed lumpectomy at about the 3 o'clock position.  There is no left breast swelling.  There is no left breast tenderness.  There is no left axillary adenopathy.  HENT:     Head: Normocephalic and atraumatic.  Eyes:     Pupils: Pupils are equal, round, and reactive to light.  Cardiovascular:     Rate and Rhythm: Normal rate and regular rhythm.  Heart sounds: Normal heart sounds.  Pulmonary:     Effort: Pulmonary effort is normal.     Breath sounds: Normal breath sounds.  Abdominal:     General: Bowel sounds are normal.     Palpations: Abdomen is soft.  Musculoskeletal:        General: No tenderness or deformity. Normal range of motion.     Cervical back: Normal range of motion.  Lymphadenopathy:     Cervical: No cervical adenopathy.  Skin:    General: Skin is warm and dry.     Findings: No erythema or rash.  Neurological:     Mental Status: She is alert and oriented to person, place, and time.  Psychiatric:        Behavior: Behavior normal.        Thought Content: Thought content normal.        Judgment: Judgment normal.      Lab Results  Component Value Date   WBC 7.4 08/10/2019   HGB 13.8 08/10/2019   HCT 45.8 08/10/2019   MCV 79.1 (L) 08/10/2019   PLT 425 (H) 08/10/2019   Lab Results  Component Value Date   FERRITIN 34 07/16/2019   IRON 24 (L) 07/16/2019   TIBC 404 07/16/2019   UIBC 379 07/16/2019   IRONPCTSAT 6 (L) 07/16/2019   Lab Results  Component Value Date   RETICCTPCT 0.8 02/02/2015   RBC 5.79 (H) 08/10/2019   RETICCTABS 41.4 02/02/2015   No results found for: KPAFRELGTCHN,  LAMBDASER, KAPLAMBRATIO No results found for: Kandis Cocking, IGMSERUM No results found for: Odetta Pink, SPEI   Chemistry      Component Value Date/Time   NA 137 08/10/2019 1153   NA 142 03/25/2017 1457   NA 139 04/10/2016 0855   K 4.5 08/10/2019 1153   K 4.1 03/25/2017 1457   K 4.2 04/10/2016 0855   CL 100 08/10/2019 1153   CL 96 (L) 03/25/2017 1457   CO2 33 (H) 08/10/2019 1153   CO2 32 03/25/2017 1457   CO2 29 04/10/2016 0855   BUN 10 08/10/2019 1153   BUN 7 03/25/2017 1457   BUN 10.0 04/10/2016 0855   CREATININE 0.83 08/10/2019 1153   CREATININE 1.0 03/25/2017 1457   CREATININE 0.8 04/10/2016 0855      Component Value Date/Time   CALCIUM 9.7 08/10/2019 1153   CALCIUM 9.8 03/25/2017 1457   CALCIUM 9.5 04/10/2016 0855   ALKPHOS 67 08/10/2019 1153   ALKPHOS 86 (H) 03/25/2017 1457   ALKPHOS 85 04/10/2016 0855   AST 15 08/10/2019 1153   AST 19 04/10/2016 0855   ALT 8 08/10/2019 1153   ALT 15 03/25/2017 1457   ALT 13 04/10/2016 0855   BILITOT 1.5 (H) 08/10/2019 1153   BILITOT 1.26 (H) 04/10/2016 0855       Impression and Plan: Ms. Ortt is a very pleasant 86 yo caucasian female with polycythemia and history of stage I infiltrating ductal carcinoma of the left breast with lumpectomy in 2013.  I would not change the Hydrea dose.  I think every other day is clearly working for her.  We will try to give her iron today.  I do believe that this is going to make her feel better and give her more stamina.  I realize that with the polycythemia, it is a "fine-line" that we have to straddle and her quality of life is the our primary concern.  I will plan to get  her back in another 4 or 5 weeks.  Volanda Napoleon, MD 3/29/20211:01 PM

## 2019-08-10 NOTE — Telephone Encounter (Signed)
Appointments scheduled calendar printed per 3/29 los 

## 2019-08-10 NOTE — Patient Instructions (Signed)

## 2019-08-11 ENCOUNTER — Telehealth: Payer: Self-pay | Admitting: Family

## 2019-08-11 NOTE — Telephone Encounter (Signed)
Appointments scheduled and I mailed a calendar to the patient per 3/29 los

## 2019-08-13 ENCOUNTER — Other Ambulatory Visit: Payer: Self-pay | Admitting: Internal Medicine

## 2019-08-13 DIAGNOSIS — Z1231 Encounter for screening mammogram for malignant neoplasm of breast: Secondary | ICD-10-CM

## 2019-08-18 ENCOUNTER — Telehealth: Payer: Self-pay

## 2019-08-18 DIAGNOSIS — F341 Dysthymic disorder: Secondary | ICD-10-CM

## 2019-08-18 NOTE — Telephone Encounter (Signed)
Lorazepam refill.   Last OV: 06/29/2019 Last Fill: 06/19/2019 #90 and 1RF Pt sig: 1/2 tab in AM and PM and 2 tabs at qhs prn UDS: 06/29/2019 Low risk

## 2019-08-19 MED ORDER — LORAZEPAM 0.5 MG PO TABS: ORAL_TABLET | ORAL | 1 refills | Status: DC

## 2019-08-19 NOTE — Telephone Encounter (Signed)
PDMP okay, RF sent 

## 2019-08-24 IMAGING — DX DG CHEST 2V
2 series · 2 of 2 positions shown · non-contrast
Comparison: 12/05/2016

CLINICAL DATA: Pt c/o chest pain x 3 weeks, pt states that
angina/chest pains are normal but that these have been worse over
the past 3 weeks, hx left breast ca and left breast lumpectomy,
cardiomyopathy, CAD, CVA, GERD, HTN

EXAM:
CHEST  2 VIEW

[chest pa]
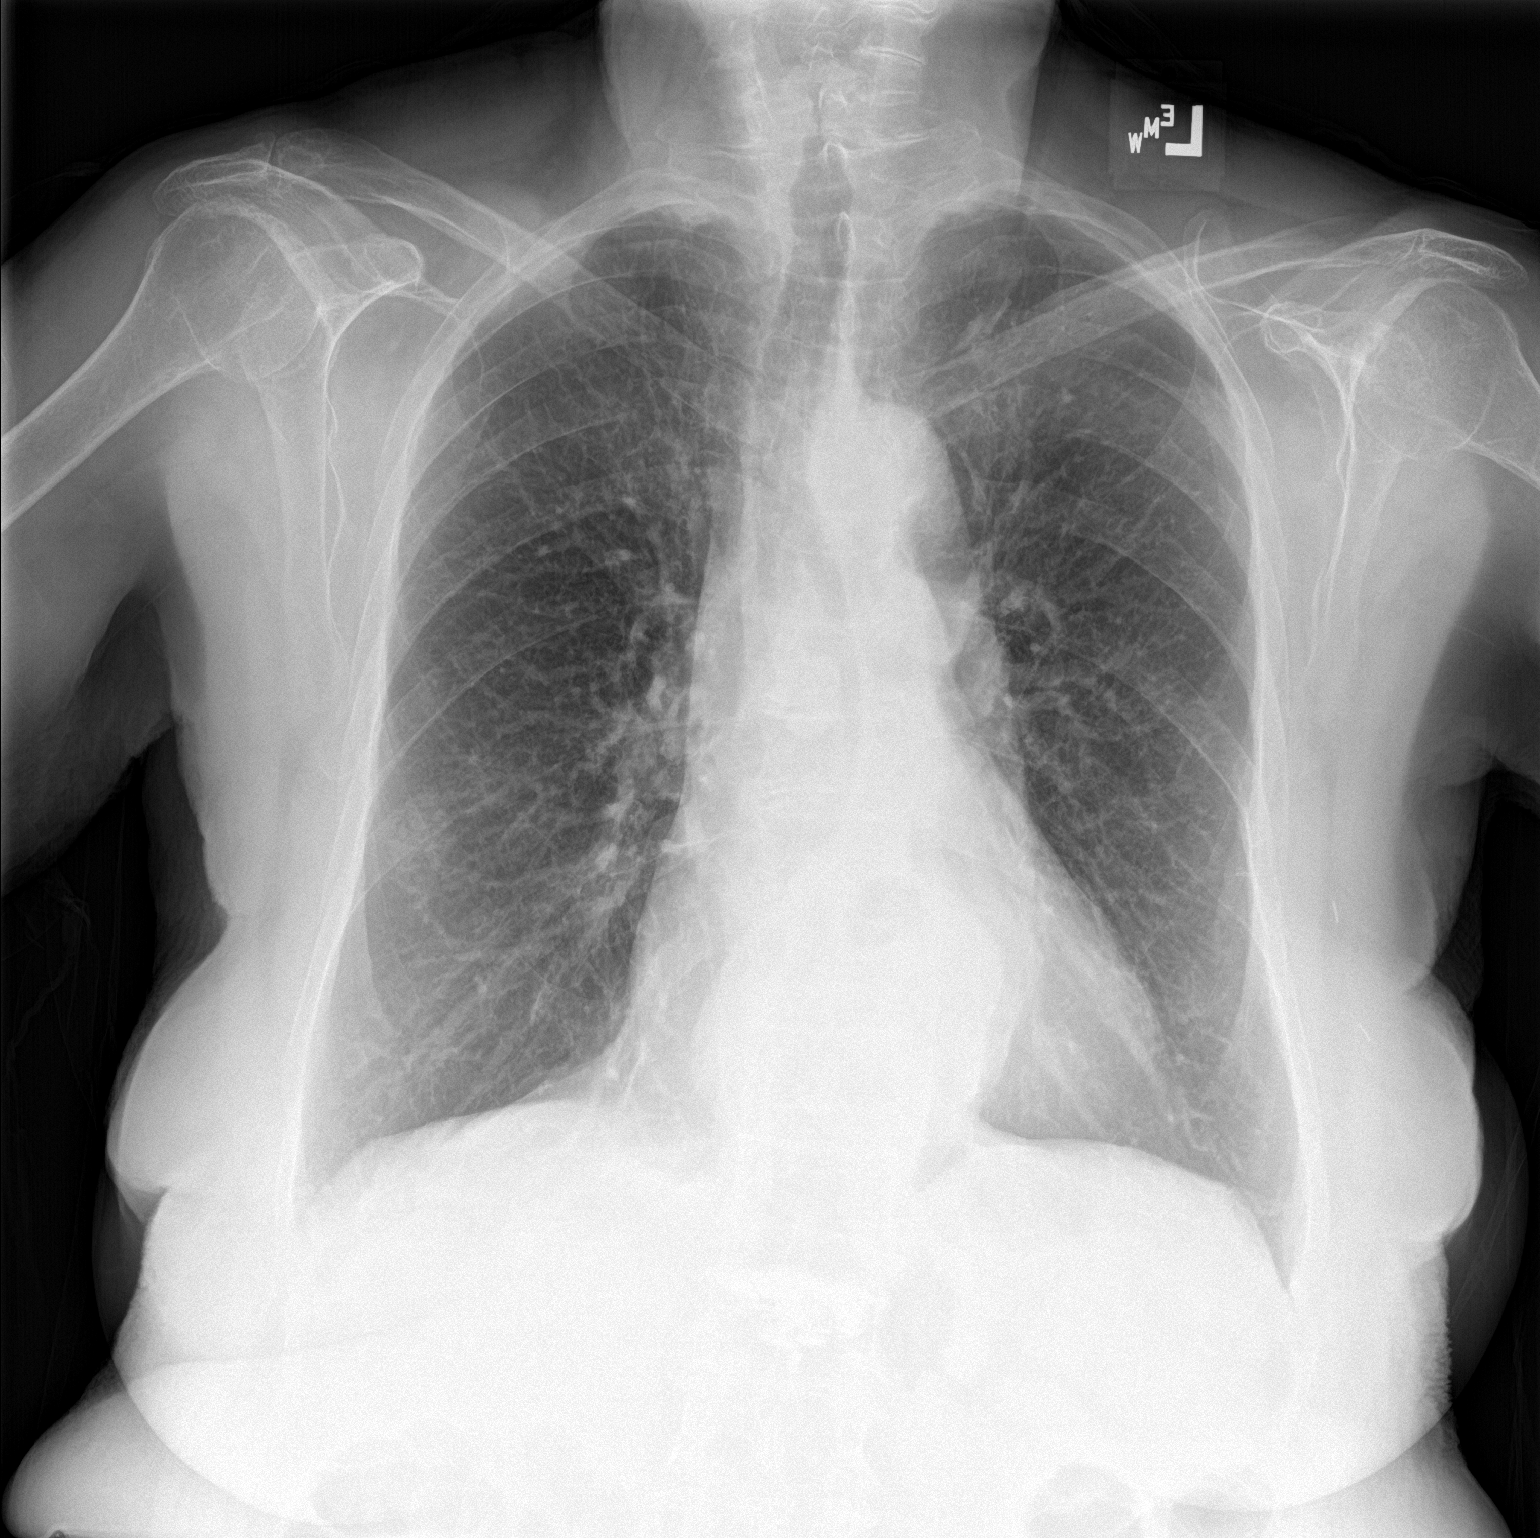

[chest lat]
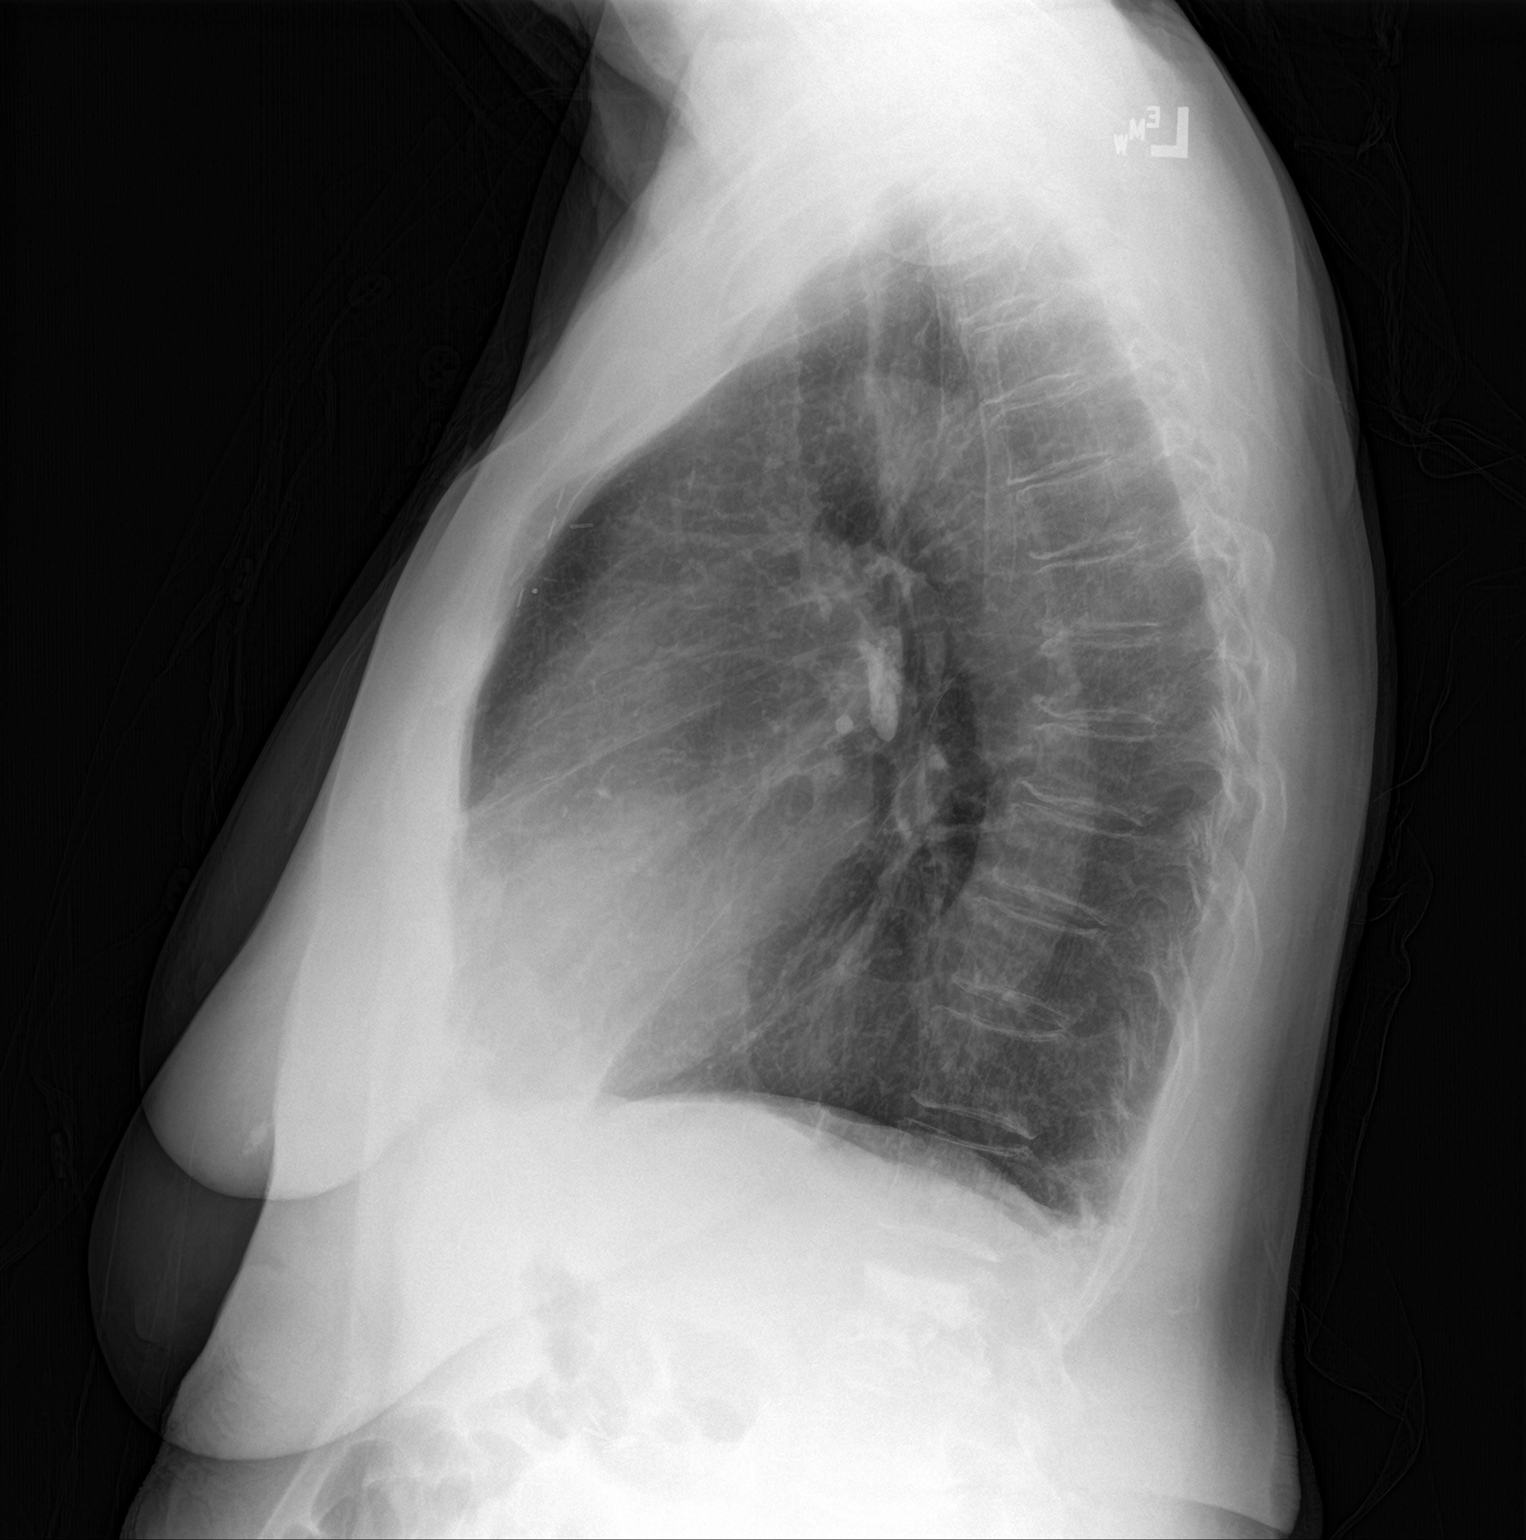

[2 of 2 positions shown; findings below may reference images not displayed]

FINDINGS: Cardiac silhouette is top-normal in size. No mediastinal or hilar
masses or evidence of adenopathy. Small to moderate hiatal hernia,
stable.

Lungs are hyperexpanded but clear.

No pleural effusion or pneumothorax.

Skeletal structures are demineralized. Vertebroplasty has been
performed at L2.

Stable changes from left breast surgery.
IMPRESSION: 1. No acute cardiopulmonary disease.

## 2019-09-04 ENCOUNTER — Emergency Department (HOSPITAL_COMMUNITY)
Admission: EM | Admit: 2019-09-04 | Discharge: 2019-09-06 | Disposition: A | Discharge status: Skilled Nursing Facility | Payer: Medicare Other | Attending: Emergency Medicine | Admitting: Emergency Medicine

## 2019-09-04 ENCOUNTER — Other Ambulatory Visit: Payer: Self-pay

## 2019-09-04 ENCOUNTER — Emergency Department (HOSPITAL_COMMUNITY): Payer: Medicare Other

## 2019-09-04 ENCOUNTER — Encounter (HOSPITAL_COMMUNITY): Payer: Self-pay

## 2019-09-04 DIAGNOSIS — Y92007 Garden or yard of unspecified non-institutional (private) residence as the place of occurrence of the external cause: Secondary | ICD-10-CM | POA: Insufficient documentation

## 2019-09-04 DIAGNOSIS — M5489 Other dorsalgia: Secondary | ICD-10-CM | POA: Diagnosis not present

## 2019-09-04 DIAGNOSIS — Y9389 Activity, other specified: Secondary | ICD-10-CM | POA: Diagnosis not present

## 2019-09-04 DIAGNOSIS — X509XXA Other and unspecified overexertion or strenuous movements or postures, initial encounter: Secondary | ICD-10-CM | POA: Diagnosis not present

## 2019-09-04 DIAGNOSIS — Z87891 Personal history of nicotine dependence: Secondary | ICD-10-CM | POA: Insufficient documentation

## 2019-09-04 DIAGNOSIS — Y999 Unspecified external cause status: Secondary | ICD-10-CM | POA: Diagnosis not present

## 2019-09-04 DIAGNOSIS — S3992XA Unspecified injury of lower back, initial encounter: Secondary | ICD-10-CM | POA: Diagnosis present

## 2019-09-04 DIAGNOSIS — M545 Low back pain: Secondary | ICD-10-CM | POA: Diagnosis not present

## 2019-09-04 DIAGNOSIS — I10 Essential (primary) hypertension: Secondary | ICD-10-CM | POA: Diagnosis not present

## 2019-09-04 DIAGNOSIS — M1990 Unspecified osteoarthritis, unspecified site: Secondary | ICD-10-CM | POA: Diagnosis not present

## 2019-09-04 DIAGNOSIS — M546 Pain in thoracic spine: Secondary | ICD-10-CM | POA: Diagnosis not present

## 2019-09-04 DIAGNOSIS — Z79899 Other long term (current) drug therapy: Secondary | ICD-10-CM | POA: Diagnosis not present

## 2019-09-04 DIAGNOSIS — S39012A Strain of muscle, fascia and tendon of lower back, initial encounter: Secondary | ICD-10-CM | POA: Diagnosis not present

## 2019-09-04 DIAGNOSIS — Z7982 Long term (current) use of aspirin: Secondary | ICD-10-CM | POA: Insufficient documentation

## 2019-09-04 DIAGNOSIS — Z20822 Contact with and (suspected) exposure to covid-19: Secondary | ICD-10-CM | POA: Diagnosis not present

## 2019-09-04 DIAGNOSIS — R52 Pain, unspecified: Secondary | ICD-10-CM | POA: Diagnosis not present

## 2019-09-04 LAB — CBC WITH DIFFERENTIAL/PLATELET
Abs Immature Granulocytes: 0.06 10*3/uL (ref 0.00–0.07)
Basophils Absolute: 0.1 10*3/uL (ref 0.0–0.1)
Basophils Relative: 1 %
Eosinophils Absolute: 0.1 10*3/uL (ref 0.0–0.5)
Eosinophils Relative: 1 %
HCT: 49.3 % — ABNORMAL HIGH (ref 36.0–46.0)
Hemoglobin: 14.8 g/dL (ref 12.0–15.0)
Immature Granulocytes: 1 %
Lymphocytes Relative: 6 %
Lymphs Abs: 0.6 10*3/uL — ABNORMAL LOW (ref 0.7–4.0)
MCH: 26.5 pg (ref 26.0–34.0)
MCHC: 30 g/dL (ref 30.0–36.0)
MCV: 88.4 fL (ref 80.0–100.0)
Monocytes Absolute: 0.9 10*3/uL (ref 0.1–1.0)
Monocytes Relative: 9 %
Neutro Abs: 8.4 10*3/uL — ABNORMAL HIGH (ref 1.7–7.7)
Neutrophils Relative %: 82 %
Platelets: 470 10*3/uL — ABNORMAL HIGH (ref 150–400)
RBC: 5.58 MIL/uL — ABNORMAL HIGH (ref 3.87–5.11)
RDW: 23.9 % — ABNORMAL HIGH (ref 11.5–15.5)
WBC: 10.1 10*3/uL (ref 4.0–10.5)
nRBC: 0 % (ref 0.0–0.2)

## 2019-09-04 LAB — COMPREHENSIVE METABOLIC PANEL
ALT: 14 U/L (ref 0–44)
AST: 21 U/L (ref 15–41)
Albumin: 3.9 g/dL (ref 3.5–5.0)
Alkaline Phosphatase: 60 U/L (ref 38–126)
Anion gap: 7 (ref 5–15)
BUN: 7 mg/dL — ABNORMAL LOW (ref 8–23)
CO2: 28 mmol/L (ref 22–32)
Calcium: 9 mg/dL (ref 8.9–10.3)
Chloride: 102 mmol/L (ref 98–111)
Creatinine, Ser: 0.61 mg/dL (ref 0.44–1.00)
GFR calc Af Amer: >60 mL/min (ref >60)
GFR calc non Af Amer: >60 mL/min (ref >60)
Glucose, Bld: 118 mg/dL — ABNORMAL HIGH (ref 70–99)
Potassium: 4.2 mmol/L (ref 3.5–5.1)
Sodium: 137 mmol/L (ref 135–145)
Total Bilirubin: 1.4 mg/dL — ABNORMAL HIGH (ref 0.3–1.2)
Total Protein: 6.6 g/dL (ref 6.5–8.1)

## 2019-09-04 LAB — URINALYSIS, ROUTINE W REFLEX MICROSCOPIC
Bilirubin Urine: NEGATIVE
Glucose, UA: NEGATIVE mg/dL
Hgb urine dipstick: NEGATIVE
Ketones, ur: 5 mg/dL — AB
Leukocytes,Ua: NEGATIVE
Nitrite: NEGATIVE
Protein, ur: NEGATIVE mg/dL
Specific Gravity, Urine: 1.014 (ref 1.005–1.030)
pH: 7 (ref 5.0–8.0)

## 2019-09-04 MED ORDER — LIDOCAINE 4 % EX PTCH
1.0000 | MEDICATED_PATCH | Freq: Two times a day (BID) | CUTANEOUS | 0 refills | Status: DC
Start: 2019-09-04 — End: 2019-09-06

## 2019-09-04 MED ORDER — ACETAMINOPHEN 325 MG PO TABS
650.0000 mg | ORAL_TABLET | Freq: Once | ORAL | Status: AC
  Administered 2019-09-04: 650 mg via ORAL
  Filled 2019-09-04: qty 2

## 2019-09-04 MED ORDER — TIZANIDINE HCL 2 MG PO CAPS
2.0000 mg | ORAL_CAPSULE | Freq: Three times a day (TID) | ORAL | 0 refills | Status: DC | PRN
Start: 2019-09-04 — End: 2019-09-06

## 2019-09-04 MED ORDER — LIDOCAINE 5 % EX PTCH
1.0000 | MEDICATED_PATCH | CUTANEOUS | Status: DC
  Administered 2019-09-04 – 2019-09-05 (×2): 1 via TRANSDERMAL
  Filled 2019-09-04 (×2): qty 1

## 2019-09-04 MED ORDER — ACETAMINOPHEN ER 650 MG PO TBCR: 650.0000 mg | EXTENDED_RELEASE_TABLET | Freq: Three times a day (TID) | ORAL | 0 refills | Status: DC

## 2019-09-04 MED ORDER — FENTANYL CITRATE (PF) 100 MCG/2ML IJ SOLN
75.0000 ug | Freq: Once | INTRAMUSCULAR | Status: AC
  Administered 2019-09-04: 75 ug via INTRAVENOUS
  Filled 2019-09-04: qty 2

## 2019-09-04 NOTE — ED Notes (Signed)
This nurse attempted to ambulate pt. Pt stated that she was in too much pain to move, and was unable to stand even with assistance. MD made aware.

## 2019-09-04 NOTE — ED Provider Notes (Addendum)
[facility] DEPT Provider Note   CSN: [id number] Arrival date & time: 09/04/19  1754     History Chief Complaint  Patient presents with  . Back Pain    [name] is a 86 y.o. female.  HPI    86 year old female comes in a chief complaint of back pain.  Patient has history of CAD, CVA, cancer. Patient reports that she had been doing yard work on Monday, Tuesday and Wednesday.  She started having back pain yesterday.  The pain is located over the middle of her back and by the waist.  The pain moves outwards.  Today the pain has been excruciating.  She has taken Tylenol without significant relief.  Given her persistent pain making it difficult for her to even get out of her chair, patient decided to come to the ER.  She denies any nausea, vomiting, fevers, chills, UTI-like symptoms.  There is no history of heavy smoking or abdominal aneurysms.   Past Medical History:  Diagnosis Date  . CAD (coronary artery disease)    mild nonobstructive disease on cath in 2003  . Cancer ([place])   . Cardiomyopathy    Probable Takotsubo, severe CP w/ normal cath in 1994. Severe CP in 2003 w/ widespread T wave inversions on ECG. Cath w/ minimal coronary disease but LV-gram showed periapical severe hypokinesis and basilar hyperkinesia (EF 40%). Last echo in 4/09 confirmed full LV functional recovery with EF 60%, no regional wall motion abnormalities, mild to moderate LVH.  Marland Kitchen CVA (cerebral infarction)   . Depression with anxiety 03-29-11   lost husband 3'09  . Diverticulitis   . DVT (deep venous thrombosis) ([place])    after venous ablation, R leg  . E. coli gastroenteritis 03-29-11   8'10  . Fracture 09/10/07   L2, status post vertebroplasty of L2 performed by IR  . GERD (gastroesophageal reflux disease)   . Headache(784.0)    migraines  . Hemorrhoids   . Hiatal hernia 03-29-11   no nerve problems  . Hyperlipidemia   . Hyperplastic colon polyp 06/2001  .  Hypertension   . Iron deficiency anemia due to chronic blood loss 12/30/2017  . OA (osteoarthritis) of knee 03-29-11   w/ bilateral knee pain-not a problem now  . OSA (obstructive sleep apnea) 03-29-11   no cpap used, not a problem now.  . Osteoporosis   . Otalgia of both ears    Dr. Simeon Craft  . Polycythemia   . Stroke [facility]) 03-29-11   CVA x2 -last 10'12-?TIA(visual problems)  . Varicose vein     Patient Active Problem List   Diagnosis Date Noted  . Raynaud's phenomenon without gangrene 05/19/2018  . Iron deficiency anemia due to chronic blood loss 12/30/2017  . Insomnia 06/25/2017  . Mild CAD 05/24/2017  . PCP NOTES >>> 02/15/2015  . Superficial thrombophlebitis 04/06/2014  . Annual physical exam 08/18/2012  . Edema 04/14/2012  . Breast cancer ([place]) 11/05/2011  . Polycythemia 11/05/2011  . TIA (transient ischemic attack) 01/16/2011  . CARDIOMYOPATHY 08/03/2009  . DJD -- pain mgmt 08/16/2008  . ABDOMINAL PAIN, LEFT LOWER QUADRANT 07/05/2008  . GAIT DISTURBANCE 10/21/2007  . ANXIETY DEPRESSION 10/08/2007  . Hyperlipidemia 06/25/2007  . Takotsubo cardiomyopathy 06/25/2007  . GERD and Hiatal Hernia  06/25/2007  . Essential hypertension 09/30/2006  . Osteoporosis 09/30/2006  . Migraine with aura 09/30/2006    Past Surgical History:  Procedure Laterality Date  . APPENDECTOMY    . BACK SURGERY    .  BREAST BIOPSY    . BREAST CYST EXCISION    . BREAST LUMPECTOMY Left 2013   left stage I left breast cancer  . CHOLECYSTECTOMY  04/09/2011   Procedure: LAPAROSCOPIC CHOLECYSTECTOMY WITH INTRAOPERATIVE CHOLANGIOGRAM;  Surgeon: Odis Hollingshead, MD;  Location: [facility] ORS;  Service: General;  Laterality: N/A;  . Dental Extraction     L maxillary molar  . KYPHOSIS SURGERY  08/2007   cement used  . OVARIAN CYST SURGERY     left  . TONSILLECTOMY    . TOTAL KNEE ARTHROPLASTY  06/2010   left  . TUBAL LIGATION       OB History    Gravida  3   Para  3   Term      Preterm       AB      Living  3     SAB      TAB      Ectopic      Multiple      Live Births              Family History  Problem Relation Age of Onset  . Breast cancer Mother   . Heart disease Father        MIs  . Colon cancer Other        cousin  . Breast cancer Sister   . Leukemia Brother        GM    Social History   Tobacco Use  . Smoking status: Former Smoker    Packs/day: 1.00    Years: 37.00    Pack years: 37.00    Types: Cigarettes    Start date: 06/26/1949    Quit date: 05/14/1988    Years since quitting: 31.3  . Smokeless tobacco: Never Used  . Tobacco comment: quit 25 years ago  Substance Use Topics  . Alcohol use: Yes    Alcohol/week: 1.0 standard drinks    Types: 1 Glasses of wine per week    Comment: occasional/social  . Drug use: No    Home Medications Prior to Admission medications   Medication Sig Start Date End Date Taking? Authorizing Provider  acetaminophen-codeine (TYLENOL #3) 300-30 MG tablet TAKE 1 TABLET BY MOUTH EVERY 8 HOURS AS NEEDED FOR MIGRAINE Patient taking differently: Take 1 tablet by mouth every 8 (eight) hours as needed for moderate pain (migraines).  07/13/19  Yes Colon Branch, MD  aspirin 81 MG tablet Take 81 mg by mouth daily.   Yes [provider]  bismuth subsalicylate (PEPTO BISMOL) 262 MG/15ML suspension Take 30 mLs by mouth every other day.   Yes [provider]  hydrochlorothiazide (HYDRODIURIL) 25 MG tablet Take 1 tablet (25 mg total) by mouth daily as needed (swelling in the legs ). 07/05/16  Yes Paz, Alda Berthold, MD  hydroxyurea (HYDREA) 500 MG capsule Take 500 mg by mouth every other day. May take with food to minimize GI side effects.   Yes [provider]  LORazepam (ATIVAN) 0.5 MG tablet TAKE 1/2 TABLET BY MOUTH EVERY MORNING AND EVERY AFTERNOON. THEN TAKE 2 TABLETS BY MOUTH AT BEDTIME AS NEEDED Patient taking differently: Take 0.25-1 mg by mouth See admin instructions. TAKE 0.25 BY MOUTH EVERY MORNING  AND EVERY AFTERNOON. THEN TAKE 1mg  BY MOUTH AT BEDTIME AS NEEDED 08/19/19  Yes Colon Branch, MD  metoprolol tartrate (LOPRESSOR) 50 MG tablet Take 1 tablet (50 mg total) by mouth 2 (two) times daily. 06/08/19  Yes  Colon Branch, MD  omeprazole (PRILOSEC) 20 MG capsule Take 20 mg by mouth daily.   Yes [provider]  valACYclovir (VALTREX) 500 MG tablet Take 2 tablets at onset of rash and 2 tablets 12 hours later as needed. 02/16/19  Yes [place], Holli Humbles, NP  acetaminophen (TYLENOL 8 HOUR) 650 MG CR tablet Take 1 tablet (650 mg total) by mouth every 8 (eight) hours. 09/04/19   Varney Biles, MD  Lidocaine 4 % PTCH Apply 1 patch topically 2 (two) times daily. 09/04/19   Varney Biles, MD  mometasone (ELOCON) 0.1 % cream Apply 1 application topically daily. Patient not taking: Reported on 09/04/2019 03/13/18   Volanda Napoleon, MD  tizanidine (ZANAFLEX) 2 MG capsule Take 1 capsule (2 mg total) by mouth 3 (three) times daily as needed for muscle spasms. 09/04/19   Varney Biles, MD    Allergies    Penicillins, Valsartan, Atorvastatin, Carvedilol, Ezetimibe, Fluvastatin sodium, Magnesium hydroxide, Meloxicam, Omeprazole magnesium, Pneumovax [pneumococcal polysaccharide vaccine], Quinapril hcl, Simvastatin, Topamax [topiramate], and Vit d-vit e-safflower oil  Review of Systems   Review of Systems  Constitutional: Positive for activity change.  Respiratory: Negative for shortness of breath.   Cardiovascular: Negative for chest pain.  Gastrointestinal: Negative for nausea and vomiting.  Genitourinary: Negative for dysuria.  Musculoskeletal: Positive for back pain.  Neurological: Negative for dizziness, weakness, light-headedness and numbness.  All other systems reviewed and are negative.   Physical Exam Updated Vital Signs BP (!) 119/93   Pulse 70   Temp 98.7 F (37.1 C) (Oral)   Resp (!) 23   SpO2 98%   Physical Exam Vitals and nursing note reviewed.  Constitutional:       Appearance: She is well-developed.  HENT:     Head: Normocephalic and atraumatic.  Cardiovascular:     Rate and Rhythm: Normal rate.  Pulmonary:     Effort: Pulmonary effort is normal.  Abdominal:     General: Bowel sounds are normal.     Palpations: There is no mass.     Tenderness: There is no abdominal tenderness.  Musculoskeletal:     Cervical back: Normal range of motion and neck supple.     Comments: Reproducible back pain over the L spine and paraspinal region, worse with palpation.  Lower extremity strength is 4+ out of 5  Skin:    General: Skin is warm and dry.  Neurological:     Mental Status: She is alert and oriented to person, place, and time.     ED Results / Procedures / Treatments   Labs (all labs ordered are listed, but only abnormal results are displayed) Labs Reviewed  COMPREHENSIVE METABOLIC PANEL - Abnormal; Notable for the following components:      Result Value   Glucose, Bld 118 (*)    BUN 7 (*)    Total Bilirubin 1.4 (*)    All other components within normal limits  CBC WITH DIFFERENTIAL/PLATELET - Abnormal; Notable for the following components:   RBC 5.58 (*)    HCT 49.3 (*)    RDW 23.9 (*)    Platelets 470 (*)    Neutro Abs 8.4 (*)    Lymphs Abs 0.6 (*)    All other components within normal limits  URINALYSIS, ROUTINE W REFLEX MICROSCOPIC - Abnormal; Notable for the following components:   APPearance HAZY (*)    Ketones, ur 5 (*)    All other components within normal limits    EKG None  Radiology DG Thoracic Spine 2 View  Result Date: 09/04/2019 CLINICAL DATA:  86 year old with back pain. EXAM: THORACIC SPINE 2 VIEWS COMPARISON:  None. FINDINGS: Please note there are only 11 rib-bearing thoracic vertebra. Bones are diffusely under mineralized. No evidence of acute compression fracture. Mild degenerative disc disease. No paravertebral soft tissue abnormality to suggest acute fracture. IMPRESSION: 1. No evidence of acute fracture of the  thoracic spine. 2. Diffuse osteopenia/osteoporosis with mild degenerative change. Electronically Signed   By: Keith Rake M.D.   On: 09/04/2019 19:51   DG Lumbar Spine Complete  Result Date: 09/04/2019 CLINICAL DATA:  86 year old with back pain. Pain onset this morning. EXAM: LUMBAR SPINE - COMPLETE 4+ VIEW COMPARISON:  Lumbar MRI 10/27/2016, abdominal CT reformats 07/01/2017 FINDINGS: Bone significantly under mineralized. L2 compression fracture with vertebral augmentation. Chronic L4 and L3 compression fractures from prior imaging. No evidence of acute compression fracture. Multilevel degenerative disc disease and facet arthropathy. IMPRESSION: 1. Diffuse osteopenia with multilevel degenerative change. No evidence of acute abnormality. 2. Chronic L2, L3, and L4 compression fractures, prior vertebral augmentation at L2. Electronically Signed   By: Keith Rake M.D.   On: 09/04/2019 19:47    Procedures Procedures (including critical care time)  Medications Ordered in ED Medications  lidocaine (LIDODERM) 5 % 1 patch (1 patch Transdermal Patch Applied 09/04/19 2104)  fentaNYL (SUBLIMAZE) injection 75 mcg (75 mcg Intravenous Given 09/04/19 1942)  acetaminophen (TYLENOL) tablet 650 mg (650 mg Oral Given 09/04/19 2106)    ED Course  I have reviewed the triage vital signs and the nursing notes.  Pertinent labs & imaging results that were available during my care of the patient were reviewed by me and considered in my medical decision making (see chart for details).  Clinical Course as of Sep 04 2347  Fri Sep 04, 2019  2344 Results of the ER work-up discussed with the patient.  We suspect that her symptoms are likely because of arthritic changes in her back.  UA screen is negative.  Patient wants to go home.  She lives by herself, but has family support.  Her grandson will pick her up.  We will send her home with anti-inflammatory and topical pain meds. PCP f/u requested.   [AN]    Clinical  Course User Index [AN] Varney Biles, MD   MDM Rules/Calculators/A&P                      Patient comes in a chief complaint of back pain.  Patient reports doing excessive activity over the last 2 or 3 days followed by increasing pain in her back.  She has history of back pain, and this pain is similar to her prior back pain.  On exam she has no evidence of AAA.  She has no neurologic deficits.  There is no chest pain, UTI-like symptoms.  Back pain is reproducible with palpation.  Labs are reassuring.  X-ray shows old compression fractures.  Plan is to get symptoms in better control.  Reassessment: Patient wants to go home, however she was unable to stand up, let alone walk. I discussed with the patient that it is not safe for [place] to discharge her if she is not able to take steps.  Patient continues to have back pain is a limiting factor.  The pain continues to be reproducible.  Lower extremity strength is 4+ out of 5 bilaterally, gross sensory exam is normal.  She denies any urinary incontinence, retention.  We  do not suspect any cord compression, cauda equina.  Given that patient is not able to ambulate, will focus on pain control and have PT and case management eval done tomorrow. If she continues to have severe pain and is not functional that she will need admission.  Final Clinical Impression(s) / ED Diagnoses Final diagnoses:  Strain of lumbar region, initial encounter  Arthritis    Rx / DC Orders ED Discharge Orders         Ordered    acetaminophen (TYLENOL 8 HOUR) 650 MG CR tablet  Every 8 hours     09/04/19 2347    Lidocaine 4 % PTCH  2 times daily     09/04/19 2347    tizanidine (ZANAFLEX) 2 MG capsule  3 times daily PRN     09/04/19 2347           Varney Biles, MD 09/04/19 [place], Nekesha Font, MD 09/05/19 [id number]

## 2019-09-04 NOTE — ED Triage Notes (Addendum)
Pt arrives [facility] from home with complaints of low back pain beginning this am.  Pt reports that she has recently been working in the yard. Pt denies trauma or injury. Pt reports recent onset of nausea.

## 2019-09-04 NOTE — Discharge Instructions (Addendum)
You were seen in the ER for back pain. Your work-up reveals that you likely have arthritis of the spine and lumbar strain.  Recommend that you take the medication as prescribed.  Continue taking Tylenol 3's.  Zanaflex as a muscle relaxant.  Ice and heat packs will also assist.  Continue to walk as much as he can tolerate. Return to the ER if your symptoms get worse.

## 2019-09-05 DIAGNOSIS — S39012A Strain of muscle, fascia and tendon of lower back, initial encounter: Secondary | ICD-10-CM | POA: Diagnosis not present

## 2019-09-05 MED ORDER — HYDROCHLOROTHIAZIDE 25 MG PO TABS
25.0000 mg | ORAL_TABLET | Freq: Every day | ORAL | Status: DC | PRN
  Filled 2019-09-05: qty 1

## 2019-09-05 MED ORDER — HYDROXYUREA 500 MG PO CAPS
500.0000 mg | ORAL_CAPSULE | ORAL | Status: DC
  Administered 2019-09-05: 500 mg via ORAL
  Filled 2019-09-05: qty 1

## 2019-09-05 MED ORDER — PANTOPRAZOLE SODIUM 40 MG PO TBEC
40.0000 mg | DELAYED_RELEASE_TABLET | Freq: Every day | ORAL | Status: DC
  Administered 2019-09-05 – 2019-09-06 (×2): 40 mg via ORAL
  Filled 2019-09-05 (×2): qty 1

## 2019-09-05 MED ORDER — TIZANIDINE HCL 4 MG PO TABS
4.0000 mg | ORAL_TABLET | Freq: Three times a day (TID) | ORAL | Status: DC
  Administered 2019-09-05 – 2019-09-06 (×4): 4 mg via ORAL
  Filled 2019-09-05 (×4): qty 1

## 2019-09-05 MED ORDER — ACETAMINOPHEN 325 MG PO TABS
650.0000 mg | ORAL_TABLET | Freq: Four times a day (QID) | ORAL | Status: DC
  Administered 2019-09-05 – 2019-09-06 (×5): 650 mg via ORAL
  Filled 2019-09-05 (×5): qty 2

## 2019-09-05 MED ORDER — METOPROLOL TARTRATE 25 MG PO TABS
50.0000 mg | ORAL_TABLET | Freq: Two times a day (BID) | ORAL | Status: DC
  Administered 2019-09-05 – 2019-09-06 (×4): 50 mg via ORAL
  Filled 2019-09-05 (×4): qty 2

## 2019-09-05 MED ORDER — ACETAMINOPHEN-CODEINE #3 300-30 MG PO TABS
1.0000 | ORAL_TABLET | Freq: Three times a day (TID) | ORAL | Status: DC | PRN
  Administered 2019-09-05 – 2019-09-06 (×3): 1 via ORAL
  Filled 2019-09-05 (×3): qty 1

## 2019-09-05 NOTE — Discharge Planning (Signed)
EDCM to follow for disposition needs.  Awaiting PT evaluation recommendation before proceeding with home health set-up.

## 2019-09-05 NOTE — ED Notes (Signed)
Patient given broth and applesauce.

## 2019-09-05 NOTE — Discharge Planning (Signed)
Licensed Clinical Social Worker is seeking post-discharge placement for this patient at the following level of care: SNF    

## 2019-09-05 NOTE — Evaluation (Signed)
Physical Therapy Evaluation Patient Details Name: [name] MRN: [id number] DOB: 08/31/1929 Today's Date: 09/05/2019   History of Present Illness  86 year old female comes in a chief complaint of back pain.  Patient has history of CAD, CVA, cancer.Patient reports that she had been doing yard work and started having back pain, inability to get out of chair.X ray :Chronic L2, L3, and L4 compression fractures, prior vertebral augmentation at L2  Clinical Impression  The patient reports and appears in severe pain with attempts to sit on edge of stretcher, could not fully sit upright. Patient reports living alone , and is very active, drives and cares for self. Patient currently requires max assistance, requested pain medication and applied heat pack. Pt admitted with above diagnosis.  Pt currently with functional limitations due to the deficits listed below (see PT Problem List). Pt will benefit from skilled PT to increase their independence and safety with mobility to allow discharge to the venue listed below.       Follow Up Recommendations SNF    Equipment Recommendations  (tba)    Recommendations for Other Services OT consult     Precautions / Restrictions Precautions Precautions: Fall Precaution Comments: severe back pain to even roll      Mobility  Bed Mobility Overal bed mobility: Needs Assistance Bed Mobility: Rolling;Sidelying to Sit;Sit to Sidelying Rolling: Mod assist Sidelying to sit: Max assist     Sit to sidelying: Max assist General bed mobility comments: assisted to roll to each side, moves very slowly and guardedly, Assisted to sit on bed edge, unable to fully sit up, crying out in pain of the back, assisted back to supine, then to right side. Patient complains of nausea.  Transfers                 General transfer comment: unable  Ambulation/Gait                Stairs            Wheelchair Mobility    Modified Rankin (Stroke  Patients Only)       Balance Overall balance assessment: Needs assistance Sitting-balance support: Feet unsupported;Bilateral upper extremity supported   Sitting balance - Comments: patient unable to tolerate upright                                     Pertinent Vitals/Pain Pain Assessment: 0-10 Pain Score: 10-Worst pain ever Pain Location: left  lumbar area Pain Descriptors / Indicators: Crushing;Moaning;Crying;Shooting;Sharp;Stabbing Pain Intervention(s): Heat applied    Home Living Family/patient expects to be discharged to:: Private residence Living Arrangements: Alone Available Help at Discharge: Available PRN/intermittently Type of Home: House(town home) Home Access: Level entry       Home Equipment: Walker - 2 wheels      Prior Function Level of Independence: Independent         Comments: drives, manages all affairs     Hand Dominance        Extremity/Trunk Assessment   Upper Extremity Assessment Upper Extremity Assessment: Overall WFL for tasks assessed    Lower Extremity Assessment Lower Extremity Assessment: RLE deficits/detail;LLE deficits/detail RLE Deficits / Details: able to bend knee and hip to reposition, unable to attempt standing RLE: Unable to fully assess due to pain LLE Deficits / Details: same as right LLE: Unable to fully assess due to pain    Cervical / Trunk Assessment  Cervical / Trunk Assessment: Other exceptions Cervical / Trunk Exceptions: indicates lumbar pain, can not sit up  Communication   Communication: No difficulties  Cognition Arousal/Alertness: Awake/alert Behavior During Therapy: Restless;Anxious Overall Cognitive Status: Within Functional Limits for tasks assessed                                        General Comments      Exercises     Assessment/Plan    PT Assessment Patient needs continued PT services  PT Problem List Decreased strength;Decreased knowledge of  precautions;Pain;Decreased range of motion;Decreased mobility;Decreased knowledge of use of DME;Decreased activity tolerance;Decreased safety awareness       PT Treatment Interventions DME instruction;Functional mobility training;Patient/family education;Gait training;Therapeutic activities;Therapeutic exercise    PT Goals (Current goals can be found in the Care Plan section)  Acute Rehab PT Goals Patient Stated Goal: to have pain relief. PT Goal Formulation: With patient Time For Goal Achievement: 09/25/19 Potential to Achieve Goals: Fair    Frequency Min 3X/week   Barriers to discharge Decreased caregiver support      Co-evaluation               AM-PAC PT "6 Clicks" Mobility  Outcome Measure Help needed turning from your back to your side while in a flat bed without using bedrails?: Total Help needed moving from lying on your back to sitting on the side of a flat bed without using bedrails?: Total Help needed moving to and from a bed to a chair (including a wheelchair)?: Total Help needed standing up from a chair using your arms (e.g., wheelchair or bedside chair)?: Total Help needed to walk in hospital room?: Total Help needed climbing 3-5 steps with a railing? : Total 6 Click Score: 6    End of Session   Activity Tolerance: Patient limited by pain Patient left: in bed;with call bell/phone within reach Nurse Communication: Mobility status;Patient requests pain meds(need for rehab) PT Visit Diagnosis: Unsteadiness on feet (R26.81);Difficulty in walking, not elsewhere classified (R26.2);Pain Pain - Right/Left: Left Pain - part of body: (back)    Time: [id number] PT Time Calculation (min) (ACUTE ONLY): 34 min   Charges:   PT Evaluation $PT Eval Moderate Complexity: 1 Mod PT Treatments $Therapeutic Activity: 8-22 mins        Tresa Endo PT Acute Rehabilitation Services Pager [phone] Office [phone]   Claretha Cooper 09/05/2019, 9:01  AM

## 2019-09-05 NOTE — ED Notes (Signed)
Assisted patient with eating.

## 2019-09-05 NOTE — NC FL2 (Signed)
[place] LEVEL OF CARE SCREENING TOOL     IDENTIFICATION  Patient Name: [name] Birthdate: [birth year] Sex: female Admission Date (Current Location): 09/04/2019  [facility] [place] Number:  Herbalist and Address:  [facility],  [place] [address], [place]      Provider Number:    Attending Physician Name and Address:  Varney Biles, MD  Relative Name and Phone Number:  [name] [phone] or [phone]    Current Level of Care: Hospital Recommended Level of Care: [place] Prior Approval Number:    Date Approved/Denied:   PASRR Number: [id number]  Discharge Plan: Home    Current Diagnoses: Patient Active Problem List   Diagnosis Date Noted  . Raynaud's phenomenon without gangrene 05/19/2018  . Iron deficiency anemia due to chronic blood loss 12/30/2017  . Insomnia 06/25/2017  . Mild CAD 05/24/2017  . PCP NOTES >>> 02/15/2015  . Superficial thrombophlebitis 04/06/2014  . Annual physical exam 08/18/2012  . Edema 04/14/2012  . Breast cancer ([place]) 11/05/2011  . Polycythemia 11/05/2011  . TIA (transient ischemic attack) 01/16/2011  . CARDIOMYOPATHY 08/03/2009  . DJD -- pain mgmt 08/16/2008  . ABDOMINAL PAIN, LEFT LOWER QUADRANT 07/05/2008  . GAIT DISTURBANCE 10/21/2007  . ANXIETY DEPRESSION 10/08/2007  . Hyperlipidemia 06/25/2007  . Takotsubo cardiomyopathy 06/25/2007  . GERD and Hiatal Hernia  06/25/2007  . Essential hypertension 09/30/2006  . Osteoporosis 09/30/2006  . Migraine with aura 09/30/2006    Orientation RESPIRATION BLADDER Height & Weight     Self, Time, Situation, Place  Normal Continent Weight:   Height:     BEHAVIORAL SYMPTOMS/MOOD NEUROLOGICAL BOWEL NUTRITION STATUS      Continent Diet  AMBULATORY STATUS COMMUNICATION OF NEEDS Skin   Extensive Assist Verbally Normal                       Personal Care Assistance Level of Assistance  Bathing,  Feeding, Dressing Bathing Assistance: Maximum assistance Feeding assistance: Limited assistance Dressing Assistance: Maximum assistance     Functional Limitations Info  Sight, Hearing, Speech Sight Info: Adequate(wears glasses) Hearing Info: Adequate Speech Info: Adequate    SPECIAL CARE FACTORS FREQUENCY  PT (By licensed PT), OT (By licensed OT)     PT Frequency: 5x per week OT Frequency: 5x per week            Contractures      Additional Factors Info  Code Status Code Status Info: full code             Current Medications (09/05/2019):  This is the current hospital active medication list Current Facility-Administered Medications  Medication Dose Route Frequency Provider Last Rate Last Admin  . acetaminophen (TYLENOL) tablet 650 mg  650 mg Oral QID Nanavati, Ankit, MD      . acetaminophen-codeine (TYLENOL #3) 300-30 MG per tablet 1 tablet  1 tablet Oral Q8H PRN Varney Biles, MD   1 tablet at 09/05/19 0147  . hydrochlorothiazide (HYDRODIURIL) tablet 25 mg  25 mg Oral Daily PRN Nanavati, Ankit, MD      . hydroxyurea (HYDREA) capsule 500 mg  500 mg Oral QODAY Nanavati, Ankit, MD      . lidocaine (LIDODERM) 5 % 1 patch  1 patch Transdermal Q24H Varney Biles, MD   1 patch at 09/04/19 2104  . metoprolol tartrate (LOPRESSOR) tablet 50 mg  50 mg Oral BID Varney Biles, MD   50 mg at 09/05/19  42  . pantoprazole (PROTONIX) EC tablet 40 mg  40 mg Oral Daily Nanavati, Ankit, MD      . tiZANidine (ZANAFLEX) tablet 4 mg  4 mg Oral TID Varney Biles, MD       Current Outpatient Medications  Medication Sig Dispense Refill  . acetaminophen-codeine (TYLENOL #3) 300-30 MG tablet TAKE 1 TABLET BY MOUTH EVERY 8 HOURS AS NEEDED FOR MIGRAINE (Patient taking differently: Take 1 tablet by mouth every 8 (eight) hours as needed for moderate pain (migraines). ) 20 tablet 0  . aspirin 81 MG tablet Take 81 mg by mouth daily.    Marland Kitchen bismuth subsalicylate (PEPTO BISMOL) 262 MG/15ML  suspension Take 30 mLs by mouth every other day.    . hydrochlorothiazide (HYDRODIURIL) 25 MG tablet Take 1 tablet (25 mg total) by mouth daily as needed (swelling in the legs ). 90 tablet 0  . hydroxyurea (HYDREA) 500 MG capsule Take 500 mg by mouth every other day. May take with food to minimize GI side effects.    Marland Kitchen LORazepam (ATIVAN) 0.5 MG tablet TAKE 1/2 TABLET BY MOUTH EVERY MORNING AND EVERY AFTERNOON. THEN TAKE 2 TABLETS BY MOUTH AT BEDTIME AS NEEDED (Patient taking differently: Take 0.25-1 mg by mouth See admin instructions. TAKE 0.25 BY MOUTH EVERY MORNING AND EVERY AFTERNOON. THEN TAKE 1mg  BY MOUTH AT BEDTIME AS NEEDED) 90 tablet 1  . metoprolol tartrate (LOPRESSOR) 50 MG tablet Take 1 tablet (50 mg total) by mouth 2 (two) times daily. 180 tablet 1  . omeprazole (PRILOSEC) 20 MG capsule Take 20 mg by mouth daily.    . valACYclovir (VALTREX) 500 MG tablet Take 2 tablets at onset of rash and 2 tablets 12 hours later as needed. 30 tablet 1  . acetaminophen (TYLENOL 8 HOUR) 650 MG CR tablet Take 1 tablet (650 mg total) by mouth every 8 (eight) hours. 30 tablet 0  . Lidocaine 4 % PTCH Apply 1 patch topically 2 (two) times daily. 12 patch 0  . mometasone (ELOCON) 0.1 % cream Apply 1 application topically daily. (Patient not taking: Reported on 09/04/2019) 45 g 0  . tizanidine (ZANAFLEX) 2 MG capsule Take 1 capsule (2 mg total) by mouth 3 (three) times daily as needed for muscle spasms. 15 capsule 0     Discharge Medications: Please see discharge summary for a list of discharge medications.  Relevant Imaging Results:  Relevant Lab Results:   Additional Information [id number]  Elliot Gurney [place], [place]

## 2019-09-05 NOTE — TOC Initial Note (Addendum)
Transition of Care [facility]) - Initial/Assessment Note    Patient Details  Name: [name] MRN: [id number] Date of Birth: February 18, 1930  Transition of [facility]) CM/SW Contact:    Elliot Gurney [place], [place] Phone Number: 09/05/2019, 9:49 AM  Clinical Narrative:                 Patient is a 86 year old female presenting to the emergency department with back pain after completing multiple days of yard work. Per patient she lives alone and is fairly independent. Patient states that she has no equipment in the home other than a foot pedal that she uses for exercise. She is not sure how she may have injured herself. Patient states that she is followed by Kathlene November, MD at [facility] and uses [employer]. Patient discussed a history of SNF stays and although reluctant is willing to return temporarily until she can get her strength back.  This Education officer, museum discussed the facility care placement process with patient stating that she has been at both [facility] and [facility] in the past. Provided patient with star rating list from [email or web address].  Fl2 to be completed and faxed out to begin be search.  Analyn Matusek, LCSW Clinical Social Work [phone]   Expected Discharge Plan: [place] Barriers to Discharge: No Barriers Identified   Patient Goals and CMS Choice Patient states their goals for this hospitalization and ongoing recovery are:: "I would like to walk again" CMS Medicare.gov Compare Post Acute Care list provided to:: Patient Choice offered to / list presented to : Patient  Expected Discharge Plan and Services Expected Discharge Plan: [place] In-house Referral: Clinical Social Work     Living arrangements for the past 2 months: Single Family Home(Townhome)                                      Prior Living Arrangements/Services Living arrangements for the past 2 months: Single Family Home(Townhome) Lives with:: Self Patient  language and need for interpreter reviewed:: No Do you feel safe going back to the place where you live?: Yes      Need for Family Participation in Patient Care: Yes (Comment) Care giver support system in place?: No (comment)   Criminal Activity/Legal Involvement Pertinent to Current Situation/Hospitalization: No - Comment as needed  Activities of Daily Living      Permission Sought/Granted   Permission granted to share information with : Yes, Verbal Permission Granted        Permission granted to share info w Relationship: [name]-grandson     Emotional Assessment Appearance:: Appears stated age Attitude/Demeanor/Rapport: Engaged Affect (typically observed): Accepting, Adaptable Orientation: : Oriented to Self, Oriented to Place, Oriented to  Time, Oriented to Situation Alcohol / Substance Use: Not Applicable Psych Involvement: No (comment)  Admission diagnosis:  back pain Patient Active Problem List   Diagnosis Date Noted  . Raynaud's phenomenon without gangrene 05/19/2018  . Iron deficiency anemia due to chronic blood loss 12/30/2017  . Insomnia 06/25/2017  . Mild CAD 05/24/2017  . PCP NOTES >>> 02/15/2015  . Superficial thrombophlebitis 04/06/2014  . Annual physical exam 08/18/2012  . Edema 04/14/2012  . Breast cancer ([place]) 11/05/2011  . Polycythemia 11/05/2011  . TIA (transient ischemic attack) 01/16/2011  . CARDIOMYOPATHY 08/03/2009  . DJD -- pain mgmt 08/16/2008  . ABDOMINAL PAIN, LEFT LOWER QUADRANT 07/05/2008  . GAIT DISTURBANCE 10/21/2007  .  ANXIETY DEPRESSION 10/08/2007  . Hyperlipidemia 06/25/2007  . Takotsubo cardiomyopathy 06/25/2007  . GERD and Hiatal Hernia  06/25/2007  . Essential hypertension 09/30/2006  . Osteoporosis 09/30/2006  . Migraine with aura 09/30/2006   PCP:  Colon Branch, MD Pharmacy:   [facility] [phone], [address] AT [facility] [place] [address] Phone:  [phone] Fax: [phone]     Social Determinants of Health ([place]) Interventions    Readmission Risk Interventions No flowsheet data found.

## 2019-09-06 DIAGNOSIS — F411 Generalized anxiety disorder: Secondary | ICD-10-CM | POA: Diagnosis not present

## 2019-09-06 DIAGNOSIS — D751 Secondary polycythemia: Secondary | ICD-10-CM | POA: Diagnosis not present

## 2019-09-06 DIAGNOSIS — K219 Gastro-esophageal reflux disease without esophagitis: Secondary | ICD-10-CM | POA: Diagnosis present

## 2019-09-06 DIAGNOSIS — I5032 Chronic diastolic (congestive) heart failure: Secondary | ICD-10-CM | POA: Diagnosis present

## 2019-09-06 DIAGNOSIS — I110 Hypertensive heart disease with heart failure: Secondary | ICD-10-CM | POA: Diagnosis present

## 2019-09-06 DIAGNOSIS — R52 Pain, unspecified: Secondary | ICD-10-CM | POA: Diagnosis not present

## 2019-09-06 DIAGNOSIS — Z9089 Acquired absence of other organs: Secondary | ICD-10-CM | POA: Diagnosis not present

## 2019-09-06 DIAGNOSIS — R450 Nervousness: Secondary | ICD-10-CM | POA: Diagnosis not present

## 2019-09-06 DIAGNOSIS — Z96652 Presence of left artificial knee joint: Secondary | ICD-10-CM | POA: Diagnosis present

## 2019-09-06 DIAGNOSIS — D473 Essential (hemorrhagic) thrombocythemia: Secondary | ICD-10-CM | POA: Diagnosis present

## 2019-09-06 DIAGNOSIS — M545 Low back pain: Secondary | ICD-10-CM | POA: Diagnosis not present

## 2019-09-06 DIAGNOSIS — M549 Dorsalgia, unspecified: Secondary | ICD-10-CM | POA: Diagnosis not present

## 2019-09-06 DIAGNOSIS — G4733 Obstructive sleep apnea (adult) (pediatric): Secondary | ICD-10-CM | POA: Diagnosis present

## 2019-09-06 DIAGNOSIS — M4854XA Collapsed vertebra, not elsewhere classified, thoracic region, initial encounter for fracture: Secondary | ICD-10-CM | POA: Diagnosis present

## 2019-09-06 DIAGNOSIS — S22070A Wedge compression fracture of T9-T10 vertebra, initial encounter for closed fracture: Secondary | ICD-10-CM | POA: Diagnosis not present

## 2019-09-06 DIAGNOSIS — M1380 Other specified arthritis, unspecified site: Secondary | ICD-10-CM | POA: Diagnosis present

## 2019-09-06 DIAGNOSIS — Z86718 Personal history of other venous thrombosis and embolism: Secondary | ICD-10-CM | POA: Diagnosis not present

## 2019-09-06 DIAGNOSIS — D45 Polycythemia vera: Secondary | ICD-10-CM | POA: Diagnosis present

## 2019-09-06 DIAGNOSIS — M5489 Other dorsalgia: Secondary | ICD-10-CM | POA: Diagnosis not present

## 2019-09-06 DIAGNOSIS — S39012D Strain of muscle, fascia and tendon of lower back, subsequent encounter: Secondary | ICD-10-CM | POA: Diagnosis not present

## 2019-09-06 DIAGNOSIS — I10 Essential (primary) hypertension: Secondary | ICD-10-CM | POA: Diagnosis not present

## 2019-09-06 DIAGNOSIS — M2550 Pain in unspecified joint: Secondary | ICD-10-CM | POA: Diagnosis not present

## 2019-09-06 DIAGNOSIS — M5136 Other intervertebral disc degeneration, lumbar region: Secondary | ICD-10-CM | POA: Diagnosis not present

## 2019-09-06 DIAGNOSIS — E785 Hyperlipidemia, unspecified: Secondary | ICD-10-CM | POA: Diagnosis present

## 2019-09-06 DIAGNOSIS — Z20822 Contact with and (suspected) exposure to covid-19: Secondary | ICD-10-CM | POA: Diagnosis present

## 2019-09-06 DIAGNOSIS — E871 Hypo-osmolality and hyponatremia: Secondary | ICD-10-CM | POA: Diagnosis not present

## 2019-09-06 DIAGNOSIS — Z7401 Bed confinement status: Secondary | ICD-10-CM | POA: Diagnosis not present

## 2019-09-06 DIAGNOSIS — M1990 Unspecified osteoarthritis, unspecified site: Secondary | ICD-10-CM | POA: Diagnosis not present

## 2019-09-06 DIAGNOSIS — I2510 Atherosclerotic heart disease of native coronary artery without angina pectoris: Secondary | ICD-10-CM | POA: Diagnosis present

## 2019-09-06 DIAGNOSIS — S39012A Strain of muscle, fascia and tendon of lower back, initial encounter: Secondary | ICD-10-CM | POA: Diagnosis not present

## 2019-09-06 DIAGNOSIS — M4856XD Collapsed vertebra, not elsewhere classified, lumbar region, subsequent encounter for fracture with routine healing: Secondary | ICD-10-CM | POA: Diagnosis present

## 2019-09-06 DIAGNOSIS — E663 Overweight: Secondary | ICD-10-CM | POA: Diagnosis present

## 2019-09-06 DIAGNOSIS — M533 Sacrococcygeal disorders, not elsewhere classified: Secondary | ICD-10-CM | POA: Diagnosis not present

## 2019-09-06 DIAGNOSIS — Z6825 Body mass index (BMI) 25.0-25.9, adult: Secondary | ICD-10-CM | POA: Diagnosis not present

## 2019-09-06 DIAGNOSIS — Z7982 Long term (current) use of aspirin: Secondary | ICD-10-CM | POA: Diagnosis not present

## 2019-09-06 DIAGNOSIS — K5900 Constipation, unspecified: Secondary | ICD-10-CM | POA: Diagnosis present

## 2019-09-06 DIAGNOSIS — Z9851 Tubal ligation status: Secondary | ICD-10-CM | POA: Diagnosis not present

## 2019-09-06 DIAGNOSIS — S22070D Wedge compression fracture of T9-T10 vertebra, subsequent encounter for fracture with routine healing: Secondary | ICD-10-CM | POA: Diagnosis not present

## 2019-09-06 DIAGNOSIS — F418 Other specified anxiety disorders: Secondary | ICD-10-CM | POA: Diagnosis present

## 2019-09-06 DIAGNOSIS — M546 Pain in thoracic spine: Secondary | ICD-10-CM | POA: Diagnosis not present

## 2019-09-06 DIAGNOSIS — Z87891 Personal history of nicotine dependence: Secondary | ICD-10-CM | POA: Diagnosis not present

## 2019-09-06 DIAGNOSIS — Z853 Personal history of malignant neoplasm of breast: Secondary | ICD-10-CM | POA: Diagnosis not present

## 2019-09-06 DIAGNOSIS — I429 Cardiomyopathy, unspecified: Secondary | ICD-10-CM | POA: Diagnosis present

## 2019-09-06 DIAGNOSIS — Z03818 Encounter for observation for suspected exposure to other biological agents ruled out: Secondary | ICD-10-CM | POA: Diagnosis not present

## 2019-09-06 DIAGNOSIS — Z9049 Acquired absence of other specified parts of digestive tract: Secondary | ICD-10-CM | POA: Diagnosis not present

## 2019-09-06 DIAGNOSIS — D500 Iron deficiency anemia secondary to blood loss (chronic): Secondary | ICD-10-CM | POA: Diagnosis present

## 2019-09-06 LAB — RESPIRATORY PANEL BY RT PCR (FLU A&B, COVID)
Influenza A by PCR: NEGATIVE
Influenza B by PCR: NEGATIVE
SARS Coronavirus 2 by RT PCR: NEGATIVE

## 2019-09-06 MED ORDER — LIDOCAINE 4 % EX PTCH
1.0000 | MEDICATED_PATCH | Freq: Two times a day (BID) | CUTANEOUS | 0 refills | Status: DC
Start: 2019-09-06 — End: 2019-09-18

## 2019-09-06 MED ORDER — ACETAMINOPHEN-CODEINE #3 300-30 MG PO TABS: ORAL_TABLET | ORAL | 0 refills | Status: DC

## 2019-09-06 MED ORDER — ACETAMINOPHEN ER 650 MG PO TBCR: 650.0000 mg | EXTENDED_RELEASE_TABLET | Freq: Three times a day (TID) | ORAL | 0 refills | Status: DC

## 2019-09-06 MED ORDER — TIZANIDINE HCL 2 MG PO CAPS
2.0000 mg | ORAL_CAPSULE | Freq: Three times a day (TID) | ORAL | 0 refills | Status: DC | PRN
Start: 2019-09-06 — End: 2019-10-05

## 2019-09-06 NOTE — ED Notes (Signed)
PT at bedside to complete assessment. PT states SNF would be appropriate for patient.

## 2019-09-06 NOTE — ED Notes (Signed)
COVID test requested from receiving facility [facility] .Marland Kitchen MD Alvino Chapel notified, CSW updated. Writer will perform test at this time.

## 2019-09-06 NOTE — ED Provider Notes (Signed)
  Physical Exam  BP (!) 175/66   Pulse 61   Temp 98.7 F (37.1 C) (Oral)   Resp 18   SpO2 96%   Physical Exam  ED Course/Procedures   Clinical Course as of Sep 05 1124  Fri Sep 04, 2019  2344 Results of the ER work-up discussed with the patient.  We suspect that her symptoms are likely because of arthritic changes in her back.  UA screen is negative.  Patient wants to go home.  She lives by herself, but has family support.  Her grandson will pick her up.  We will send her home with anti-inflammatory and topical pain meds. PCP f/u requested.   [AN]    Clinical Course User Index [AN] Varney Biles, MD    Procedures  MDM  Patient has been ending placement to nursing home. Has been arranged. New prescription for medicine given to go to nursing home. Discharge.      Davonna Belling, MD 09/06/19 [phone]

## 2019-09-06 NOTE — ED Notes (Signed)
Patient reporting pain in back is 10/10. Requesting pain medications. Gave prn medication. Lunch tray provided.patient cleaned, peri care provided, pure wick, tubing and canister changed out at this time.

## 2019-09-06 NOTE — Evaluation (Signed)
Occupational Therapy Evaluation Patient Details Name: [name] MRN: [id number] DOB: 09-Feb-1930 Today's Date: 09/06/2019    History of Present Illness 86 year old female comes in a chief complaint of back pain.  Patient has history of CAD, CVA, cancer.Patient reports that she had been doing yard work and started having back pain, inability to get out of chair.X ray :Chronic L2, L3, and L4 compression fractures, prior vertebral augmentation at L2   Clinical Impression   PTA, pt was living at home alone with assistance available intermittently, pt reports she was independent with ADL/IADL and was driving and managing medications. Pt was independent with functional mobility. Pt currently limited by pain in lower back. She required maxA for LB ADL and minA+2 to stand from EOB and take side steps toward [facility]. Pt required minA-modA for bed mobility. Due to decline in current level of function, pt would benefit from acute OT to address established goals to facilitate safe D/C to venue listed below. At this time, recommend SNF follow-up. Will continue to follow acutely.     Follow Up Recommendations  SNF;Supervision/Assistance - 24 hour    Equipment Recommendations  3 in 1 bedside commode    Recommendations for Other Services       Precautions / Restrictions Precautions Precautions: Fall Precaution Comments: severe back pain to even roll      Mobility Bed Mobility Overal bed mobility: Needs Assistance Bed Mobility: Rolling;Sidelying to Sit;Sit to Sidelying Rolling: Min assist Sidelying to sit: Mod assist;HOB elevated     Sit to sidelying: Mod assist;HOB elevated General bed mobility comments: assisted to roll to each side, pt guarded and moves very slowly  Transfers Overall transfer level: Needs assistance Equipment used: 2 person hand held assist Transfers: Sit to/from Stand Sit to Stand: Min assist;+2 physical assistance;+2 safety/equipment         General  transfer comment: minA+2 to progress into standing, pt limited by pain, able to tolerate standing and side steps toward head of bed, then requesting to return to sitting    Balance Overall balance assessment: Needs assistance Sitting-balance support: Feet unsupported;Bilateral upper extremity supported Sitting balance-Leahy Scale: Fair     Standing balance support: Bilateral upper extremity supported Standing balance-Leahy Scale: Poor Standing balance comment: reliant on BUE support in standing                           ADL either performed or assessed with clinical judgement   ADL Overall ADL's : Needs assistance/impaired Eating/Feeding: Set up;Sitting   Grooming: Set up;Sitting   Upper Body Bathing: Minimal assistance;Sitting   Lower Body Bathing: Moderate assistance;Sit to/from stand   Upper Body Dressing : Minimal assistance;Sitting   Lower Body Dressing: Maximal assistance;Sit to/from stand Lower Body Dressing Details (indicate cue type and reason): maxA to access feet;donned/doffed briefs, donned socks for pt Toilet Transfer: Minimal assistance;+2 for physical assistance;+2 for safety/equipment Toilet Transfer Details (indicate cue type and reason): sit<>stand;pt wet upon arrival, required asstance to change clothing and linens Toileting- Clothing Manipulation and Hygiene: Maximal assistance;Sit to/from stand Toileting - Clothing Manipulation Details (indicate cue type and reason): maxA to [employer],      Functional mobility during ADLs: Minimal assistance;+2 for physical assistance;+2 for safety/equipment General ADL Comments: pt limited by pain, stood x1 from EOB and took side steps toward head of bed with minA+2     Vision Baseline Vision/History: Wears glasses Wears Glasses: At all times Patient Visual Report: No change from  baseline Vision Assessment?: No apparent visual deficits     Perception     Praxis      Pertinent Vitals/Pain Pain  Assessment: Faces Faces Pain Scale: Hurts even more Pain Location: left  lumbar area Pain Descriptors / Indicators: Crushing;Moaning;Crying;Shooting;Sharp;Stabbing Pain Intervention(s): Limited activity within patient's tolerance;Monitored during session     Hand Dominance Right   Extremity/Trunk Assessment Upper Extremity Assessment Upper Extremity Assessment: Overall WFL for tasks assessed   Lower Extremity Assessment Lower Extremity Assessment: Defer to PT evaluation;Generalized weakness   Cervical / Trunk Assessment Cervical / Trunk Assessment: Other exceptions Cervical / Trunk Exceptions: no bending, no lifting, no twisting   Communication Communication Communication: No difficulties   Cognition Arousal/Alertness: Awake/alert Behavior During Therapy: Restless;Anxious Overall Cognitive Status: Within Functional Limits for tasks assessed                                 General Comments: pt distracted by pain    General Comments  vitasl WNL throughout, pt on RA    Exercises     Shoulder Instructions      Home Living Family/patient expects to be discharged to:: Private residence Living Arrangements: Alone Available Help at Discharge: Available PRN/intermittently Type of Home: House(town home) Home Access: Level entry           Bathroom Shower/Tub: Occupational psychologist: Standard     Home Equipment: Environmental consultant - 2 wheels          Prior Functioning/Environment Level of Independence: Independent        Comments: drives, manages all affairs        OT Problem List: Decreased activity tolerance;Impaired balance (sitting and/or standing);Decreased safety awareness;Decreased knowledge of precautions;Pain      OT Treatment/Interventions: Self-care/ADL training;Therapeutic exercise;Energy conservation;DME and/or AE instruction;Therapeutic activities;Patient/family education;Balance training    OT Goals(Current goals can be found in  the care plan section) Acute Rehab OT Goals Patient Stated Goal: to not be in pain OT Goal Formulation: With patient Time For Goal Achievement: 09/20/19 Potential to Achieve Goals: Good ADL Goals Pt Will Perform Grooming: sitting;standing;with modified independence Pt Will Perform Upper Body Dressing: sitting;with modified independence Pt Will Perform Lower Body Dressing: with modified independence;sit to/from stand Pt Will Transfer to Toilet: with modified independence;ambulating Additional ADL Goal #1: Pt will progress to EOB with supervision in preparation for ADL.  OT Frequency: Min 2X/week   Barriers to D/C: Decreased caregiver support  pt lives alone       Co-evaluation              AM-PAC OT "6 Clicks" Daily Activity     Outcome Measure Help from another person eating meals?: A Little Help from another person taking care of personal grooming?: A Little Help from another person toileting, which includes using toliet, bedpan, or urinal?: A Little Help from another person bathing (including washing, rinsing, drying)?: A Little Help from another person to put on and taking off regular upper body clothing?: A Little Help from another person to put on and taking off regular lower body clothing?: A Lot 6 Click Score: 17   End of Session Equipment Utilized During Treatment: Gait belt Nurse Communication: Mobility status  Activity Tolerance: Patient limited by pain Patient left: in bed;with call bell/phone within reach  OT Visit Diagnosis: Other abnormalities of gait and mobility (R26.89);Muscle weakness (generalized) (M62.81);History of falling (Z91.81);Pain Pain - part of body: (lumbar )  TimeES:[id number] OT Time Calculation (min): 24 min Charges:  OT General Charges $OT Visit: 1 Visit OT Evaluation $OT Eval Moderate Complexity: 1 Mod OT Treatments $Self Care/Home Management : 8-22 mins  Helene Kelp OTR/L Acute Rehabilitation Services Office:  [phone]   Wyn Forster 09/06/2019, 12:45 PM

## 2019-09-06 NOTE — ED Notes (Signed)
Patient pending discharge to [facility] at this time. Currently awaiting CSW to provide room # and phone # to call report to receiving nurse. Patient states her daughter is on the way to visit her. CSW notified and CSW is awaiting arrival of daughter to sign paperwork regarding transfer to SNF.

## 2019-09-06 NOTE — ED Notes (Signed)
Per CSW Patient will be going to room 123. Number for report is [phone]. Patient can be transported via [place].

## 2019-09-06 NOTE — ED Notes (Signed)
Called report to nurse "Tee" at [facility]. Paper prescriptions for Tylenol 3, Lidocaine Patches, and tylenol to be sent with patient. AVS will also be sent. Patient's daughter at bedside. Nurse explained process to daughter. [facility] contacted to transport patient to [facility]. AVS, Trans. Med. Necess. And face sheet printed and ready at [facility] for [place].

## 2019-09-06 NOTE — Progress Notes (Addendum)
11:00a CSW spoke with patient regarding current bed offers. CSW went over Medicare ratings of each facility and where they were located. Patient accepted bed offer at [facility]. CSW spoke with Bryson Ha who reports patient can be accepted today. Patient will be going to room 123. Number for report is [phone]. Patient can be transported via [place].   9:00a CSW contacted patient's SNF preferences, [facility] and [facility] for bed offers. [facility] reports they will not have beds until Tuesday and [facility] cannot accept patient without a 3-night inpatient qualifying stay. Patient does have other bed offers and CSW to discuss these options with patient. Will continue to follow up.   Golden Circle, LCSW Transitions of Care Department [facility] ED [phone]

## 2019-09-07 ENCOUNTER — Telehealth: Payer: Self-pay | Admitting: Internal Medicine

## 2019-09-07 DIAGNOSIS — D751 Secondary polycythemia: Secondary | ICD-10-CM | POA: Diagnosis not present

## 2019-09-07 DIAGNOSIS — M549 Dorsalgia, unspecified: Secondary | ICD-10-CM | POA: Diagnosis not present

## 2019-09-07 DIAGNOSIS — I10 Essential (primary) hypertension: Secondary | ICD-10-CM | POA: Diagnosis not present

## 2019-09-07 DIAGNOSIS — I2510 Atherosclerotic heart disease of native coronary artery without angina pectoris: Secondary | ICD-10-CM | POA: Diagnosis not present

## 2019-09-07 NOTE — Telephone Encounter (Signed)
Please call the patient, arrange for a ER follow-up, DX back pain

## 2019-09-08 NOTE — Telephone Encounter (Signed)
Spoke w/ Pt- she has been given meds from nursing/rehab facility.

## 2019-09-08 NOTE — Telephone Encounter (Signed)
Advise patient: If I understand correctly she is at a rehab/nursing home facility. They have their own physicians so  she should report pain to the nurse taking care of her so they can contact the physician and come up with a plan of care.

## 2019-09-08 NOTE — Telephone Encounter (Signed)
Patient is at [facility]. Unsure of her release date. She states She's still in a lot of pain and wanted to know if meds could be sent over to her. As for the appt she stated she will call back once she is released from rehab for her follow up appointment

## 2019-09-09 ENCOUNTER — Telehealth: Payer: Self-pay | Admitting: Internal Medicine

## 2019-09-09 NOTE — Progress Notes (Signed)
  Chronic Care Management   Outreach Note  09/09/2019 Name: Maat Bauguess MRN: [id number] DOB: [birth year]  Referred by: Colon Branch, MD Reason for referral : No chief complaint on file.   An unsuccessful telephone outreach was attempted today. The patient was referred to the pharmacist for assistance with care management and care coordination.    This note is not being shared with the patient for the following reason: To respect privacy (The patient or proxy has requested that the information not be shared).  Follow Up Plan:   Raynicia Dukes [employer] Scheduler

## 2019-09-10 DIAGNOSIS — M549 Dorsalgia, unspecified: Secondary | ICD-10-CM | POA: Diagnosis not present

## 2019-09-10 DIAGNOSIS — I10 Essential (primary) hypertension: Secondary | ICD-10-CM | POA: Diagnosis not present

## 2019-09-10 DIAGNOSIS — I2510 Atherosclerotic heart disease of native coronary artery without angina pectoris: Secondary | ICD-10-CM | POA: Diagnosis not present

## 2019-09-10 DIAGNOSIS — F411 Generalized anxiety disorder: Secondary | ICD-10-CM | POA: Diagnosis not present

## 2019-09-11 ENCOUNTER — Telehealth: Payer: Self-pay | Admitting: Internal Medicine

## 2019-09-11 NOTE — Telephone Encounter (Signed)
PT was transferred to [facility] for rehab. She is unhappy with the location and want to know if Larose Kells can help her  Get transferred to [facility] home. Please contact [name] [phone]

## 2019-09-12 ENCOUNTER — Other Ambulatory Visit: Payer: Self-pay

## 2019-09-12 ENCOUNTER — Encounter (HOSPITAL_COMMUNITY): Payer: Self-pay

## 2019-09-12 ENCOUNTER — Inpatient Hospital Stay (HOSPITAL_COMMUNITY)
Admission: EM | Admit: 2019-09-12 | Discharge: 2019-09-17 | DRG: 516 | Disposition: A | Discharge status: Skilled Nursing Facility | Payer: Medicare Other | Source: Skilled Nursing Facility | Attending: Internal Medicine | Admitting: Internal Medicine

## 2019-09-12 DIAGNOSIS — Z79891 Long term (current) use of opiate analgesic: Secondary | ICD-10-CM

## 2019-09-12 DIAGNOSIS — Z8719 Personal history of other diseases of the digestive system: Secondary | ICD-10-CM

## 2019-09-12 DIAGNOSIS — Z9089 Acquired absence of other organs: Secondary | ICD-10-CM

## 2019-09-12 DIAGNOSIS — Z96652 Presence of left artificial knee joint: Secondary | ICD-10-CM | POA: Diagnosis present

## 2019-09-12 DIAGNOSIS — Z806 Family history of leukemia: Secondary | ICD-10-CM

## 2019-09-12 DIAGNOSIS — Z880 Allergy status to penicillin: Secondary | ICD-10-CM

## 2019-09-12 DIAGNOSIS — I5032 Chronic diastolic (congestive) heart failure: Secondary | ICD-10-CM | POA: Diagnosis not present

## 2019-09-12 DIAGNOSIS — Z888 Allergy status to other drugs, medicaments and biological substances status: Secondary | ICD-10-CM

## 2019-09-12 DIAGNOSIS — D473 Essential (hemorrhagic) thrombocythemia: Secondary | ICD-10-CM | POA: Diagnosis present

## 2019-09-12 DIAGNOSIS — Z9851 Tubal ligation status: Secondary | ICD-10-CM

## 2019-09-12 DIAGNOSIS — F341 Dysthymic disorder: Secondary | ICD-10-CM

## 2019-09-12 DIAGNOSIS — Z8249 Family history of ischemic heart disease and other diseases of the circulatory system: Secondary | ICD-10-CM

## 2019-09-12 DIAGNOSIS — R109 Unspecified abdominal pain: Secondary | ICD-10-CM | POA: Diagnosis not present

## 2019-09-12 DIAGNOSIS — Z8673 Personal history of transient ischemic attack (TIA), and cerebral infarction without residual deficits: Secondary | ICD-10-CM

## 2019-09-12 DIAGNOSIS — I700 Atherosclerosis of aorta: Secondary | ICD-10-CM | POA: Diagnosis not present

## 2019-09-12 DIAGNOSIS — I429 Cardiomyopathy, unspecified: Secondary | ICD-10-CM | POA: Diagnosis present

## 2019-09-12 DIAGNOSIS — M4854XA Collapsed vertebra, not elsewhere classified, thoracic region, initial encounter for fracture: Secondary | ICD-10-CM | POA: Diagnosis not present

## 2019-09-12 DIAGNOSIS — K5900 Constipation, unspecified: Secondary | ICD-10-CM | POA: Diagnosis present

## 2019-09-12 DIAGNOSIS — I110 Hypertensive heart disease with heart failure: Secondary | ICD-10-CM | POA: Diagnosis not present

## 2019-09-12 DIAGNOSIS — G4733 Obstructive sleep apnea (adult) (pediatric): Secondary | ICD-10-CM | POA: Diagnosis present

## 2019-09-12 DIAGNOSIS — M549 Dorsalgia, unspecified: Secondary | ICD-10-CM | POA: Diagnosis present

## 2019-09-12 DIAGNOSIS — D45 Polycythemia vera: Secondary | ICD-10-CM | POA: Diagnosis present

## 2019-09-12 DIAGNOSIS — K219 Gastro-esophageal reflux disease without esophagitis: Secondary | ICD-10-CM | POA: Diagnosis present

## 2019-09-12 DIAGNOSIS — I774 Celiac artery compression syndrome: Secondary | ICD-10-CM | POA: Diagnosis not present

## 2019-09-12 DIAGNOSIS — Z20822 Contact with and (suspected) exposure to covid-19: Secondary | ICD-10-CM | POA: Diagnosis present

## 2019-09-12 DIAGNOSIS — Z887 Allergy status to serum and vaccine status: Secondary | ICD-10-CM

## 2019-09-12 DIAGNOSIS — Z6825 Body mass index (BMI) 25.0-25.9, adult: Secondary | ICD-10-CM

## 2019-09-12 DIAGNOSIS — E785 Hyperlipidemia, unspecified: Secondary | ICD-10-CM | POA: Diagnosis present

## 2019-09-12 DIAGNOSIS — Z87891 Personal history of nicotine dependence: Secondary | ICD-10-CM

## 2019-09-12 DIAGNOSIS — D500 Iron deficiency anemia secondary to blood loss (chronic): Secondary | ICD-10-CM | POA: Diagnosis present

## 2019-09-12 DIAGNOSIS — Z86718 Personal history of other venous thrombosis and embolism: Secondary | ICD-10-CM

## 2019-09-12 DIAGNOSIS — M545 Low back pain: Secondary | ICD-10-CM | POA: Diagnosis not present

## 2019-09-12 DIAGNOSIS — F418 Other specified anxiety disorders: Secondary | ICD-10-CM | POA: Diagnosis present

## 2019-09-12 DIAGNOSIS — Z7982 Long term (current) use of aspirin: Secondary | ICD-10-CM

## 2019-09-12 DIAGNOSIS — I10 Essential (primary) hypertension: Secondary | ICD-10-CM | POA: Diagnosis present

## 2019-09-12 DIAGNOSIS — Z800 Family history of malignant neoplasm of digestive organs: Secondary | ICD-10-CM

## 2019-09-12 DIAGNOSIS — Z886 Allergy status to analgesic agent status: Secondary | ICD-10-CM

## 2019-09-12 DIAGNOSIS — Z803 Family history of malignant neoplasm of breast: Secondary | ICD-10-CM

## 2019-09-12 DIAGNOSIS — M4856XD Collapsed vertebra, not elsewhere classified, lumbar region, subsequent encounter for fracture with routine healing: Secondary | ICD-10-CM | POA: Diagnosis present

## 2019-09-12 DIAGNOSIS — E871 Hypo-osmolality and hyponatremia: Secondary | ICD-10-CM | POA: Diagnosis not present

## 2019-09-12 DIAGNOSIS — Z8619 Personal history of other infectious and parasitic diseases: Secondary | ICD-10-CM

## 2019-09-12 DIAGNOSIS — Z79899 Other long term (current) drug therapy: Secondary | ICD-10-CM

## 2019-09-12 DIAGNOSIS — M546 Pain in thoracic spine: Secondary | ICD-10-CM | POA: Diagnosis not present

## 2019-09-12 DIAGNOSIS — I2510 Atherosclerotic heart disease of native coronary artery without angina pectoris: Secondary | ICD-10-CM | POA: Diagnosis present

## 2019-09-12 DIAGNOSIS — S22070D Wedge compression fracture of T9-T10 vertebra, subsequent encounter for fracture with routine healing: Secondary | ICD-10-CM | POA: Diagnosis not present

## 2019-09-12 DIAGNOSIS — S22070A Wedge compression fracture of T9-T10 vertebra, initial encounter for closed fracture: Secondary | ICD-10-CM | POA: Diagnosis present

## 2019-09-12 DIAGNOSIS — Z9049 Acquired absence of other specified parts of digestive tract: Secondary | ICD-10-CM

## 2019-09-12 DIAGNOSIS — Z03818 Encounter for observation for suspected exposure to other biological agents ruled out: Secondary | ICD-10-CM | POA: Diagnosis not present

## 2019-09-12 DIAGNOSIS — Z853 Personal history of malignant neoplasm of breast: Secondary | ICD-10-CM

## 2019-09-12 DIAGNOSIS — Z86010 Personal history of colonic polyps: Secondary | ICD-10-CM

## 2019-09-12 DIAGNOSIS — E663 Overweight: Secondary | ICD-10-CM | POA: Diagnosis present

## 2019-09-12 MED ORDER — FENTANYL CITRATE (PF) 100 MCG/2ML IJ SOLN
25.0000 ug | Freq: Once | INTRAMUSCULAR | Status: AC
  Administered 2019-09-13: 25 ug via INTRAVENOUS
  Filled 2019-09-12: qty 2

## 2019-09-12 MED ORDER — LOSARTAN POTASSIUM 50 MG PO TABS: 100.0000 mg | ORAL_TABLET | Freq: Once | ORAL | Status: DC

## 2019-09-12 MED ORDER — OXYMETAZOLINE HCL 0.05 % NA SOLN: 1.0000 | Freq: Once | NASAL | Status: DC

## 2019-09-12 NOTE — ED Notes (Signed)
Per EMS, Pt is coming from [place]. Pt is complaining of back pain and does not believe that the facility is controlling her pain. Pt was sent to [facility] for rehab regarding pts back.

## 2019-09-12 NOTE — ED Triage Notes (Signed)
Pt arrived via [place] from [place] for rehab CC Lower back pain. Pt was seen at [facility] 4/23 for similar complaint. Pt reports no injury or new onset mechanism since last visit. Pt states " she woke  Up to the pain of her back hurting". Per EMS A'OX4 and able to stan transfer.   Pt received codeine at 1800 and lidocaine patch removed 1 hour ago per facility.     Hx HTN, anxiety,

## 2019-09-12 NOTE — Telephone Encounter (Signed)
[name], Please Armed forces operational officer, see if a transfer is possible and what paperwork I would have to do. Let me know.

## 2019-09-13 ENCOUNTER — Encounter (HOSPITAL_COMMUNITY): Payer: Self-pay

## 2019-09-13 ENCOUNTER — Emergency Department (HOSPITAL_COMMUNITY): Payer: Medicare Other

## 2019-09-13 DIAGNOSIS — I2510 Atherosclerotic heart disease of native coronary artery without angina pectoris: Secondary | ICD-10-CM | POA: Diagnosis present

## 2019-09-13 DIAGNOSIS — Z96652 Presence of left artificial knee joint: Secondary | ICD-10-CM | POA: Diagnosis present

## 2019-09-13 DIAGNOSIS — M6281 Muscle weakness (generalized): Secondary | ICD-10-CM | POA: Diagnosis not present

## 2019-09-13 DIAGNOSIS — F29 Unspecified psychosis not due to a substance or known physiological condition: Secondary | ICD-10-CM | POA: Diagnosis not present

## 2019-09-13 DIAGNOSIS — K219 Gastro-esophageal reflux disease without esophagitis: Secondary | ICD-10-CM

## 2019-09-13 DIAGNOSIS — I10 Essential (primary) hypertension: Secondary | ICD-10-CM | POA: Diagnosis not present

## 2019-09-13 DIAGNOSIS — M1990 Unspecified osteoarthritis, unspecified site: Secondary | ICD-10-CM | POA: Diagnosis not present

## 2019-09-13 DIAGNOSIS — I7300 Raynaud's syndrome without gangrene: Secondary | ICD-10-CM | POA: Diagnosis not present

## 2019-09-13 DIAGNOSIS — I110 Hypertensive heart disease with heart failure: Secondary | ICD-10-CM | POA: Diagnosis present

## 2019-09-13 DIAGNOSIS — I5032 Chronic diastolic (congestive) heart failure: Secondary | ICD-10-CM

## 2019-09-13 DIAGNOSIS — Z9851 Tubal ligation status: Secondary | ICD-10-CM | POA: Diagnosis not present

## 2019-09-13 DIAGNOSIS — Z981 Arthrodesis status: Secondary | ICD-10-CM | POA: Diagnosis not present

## 2019-09-13 DIAGNOSIS — E663 Overweight: Secondary | ICD-10-CM | POA: Diagnosis present

## 2019-09-13 DIAGNOSIS — K5900 Constipation, unspecified: Secondary | ICD-10-CM | POA: Diagnosis present

## 2019-09-13 DIAGNOSIS — R109 Unspecified abdominal pain: Secondary | ICD-10-CM | POA: Diagnosis not present

## 2019-09-13 DIAGNOSIS — E785 Hyperlipidemia, unspecified: Secondary | ICD-10-CM | POA: Diagnosis present

## 2019-09-13 DIAGNOSIS — I429 Cardiomyopathy, unspecified: Secondary | ICD-10-CM | POA: Diagnosis present

## 2019-09-13 DIAGNOSIS — S22070A Wedge compression fracture of T9-T10 vertebra, initial encounter for closed fracture: Secondary | ICD-10-CM | POA: Diagnosis not present

## 2019-09-13 DIAGNOSIS — M5489 Other dorsalgia: Secondary | ICD-10-CM | POA: Diagnosis not present

## 2019-09-13 DIAGNOSIS — Z86718 Personal history of other venous thrombosis and embolism: Secondary | ICD-10-CM | POA: Diagnosis not present

## 2019-09-13 DIAGNOSIS — D500 Iron deficiency anemia secondary to blood loss (chronic): Secondary | ICD-10-CM | POA: Diagnosis present

## 2019-09-13 DIAGNOSIS — Z9089 Acquired absence of other organs: Secondary | ICD-10-CM | POA: Diagnosis not present

## 2019-09-13 DIAGNOSIS — M549 Dorsalgia, unspecified: Secondary | ICD-10-CM | POA: Diagnosis not present

## 2019-09-13 DIAGNOSIS — M545 Low back pain: Secondary | ICD-10-CM | POA: Diagnosis not present

## 2019-09-13 DIAGNOSIS — I428 Other cardiomyopathies: Secondary | ICD-10-CM | POA: Diagnosis not present

## 2019-09-13 DIAGNOSIS — Z6825 Body mass index (BMI) 25.0-25.9, adult: Secondary | ICD-10-CM | POA: Diagnosis not present

## 2019-09-13 DIAGNOSIS — Z7401 Bed confinement status: Secondary | ICD-10-CM | POA: Diagnosis not present

## 2019-09-13 DIAGNOSIS — C50919 Malignant neoplasm of unspecified site of unspecified female breast: Secondary | ICD-10-CM | POA: Diagnosis not present

## 2019-09-13 DIAGNOSIS — I774 Celiac artery compression syndrome: Secondary | ICD-10-CM | POA: Diagnosis not present

## 2019-09-13 DIAGNOSIS — Z20822 Contact with and (suspected) exposure to covid-19: Secondary | ICD-10-CM | POA: Diagnosis present

## 2019-09-13 DIAGNOSIS — Z9049 Acquired absence of other specified parts of digestive tract: Secondary | ICD-10-CM | POA: Diagnosis not present

## 2019-09-13 DIAGNOSIS — Z853 Personal history of malignant neoplasm of breast: Secondary | ICD-10-CM | POA: Diagnosis not present

## 2019-09-13 DIAGNOSIS — M2550 Pain in unspecified joint: Secondary | ICD-10-CM | POA: Diagnosis not present

## 2019-09-13 DIAGNOSIS — E871 Hypo-osmolality and hyponatremia: Secondary | ICD-10-CM | POA: Diagnosis not present

## 2019-09-13 DIAGNOSIS — S22070D Wedge compression fracture of T9-T10 vertebra, subsequent encounter for fracture with routine healing: Secondary | ICD-10-CM | POA: Diagnosis not present

## 2019-09-13 DIAGNOSIS — F418 Other specified anxiety disorders: Secondary | ICD-10-CM | POA: Diagnosis present

## 2019-09-13 DIAGNOSIS — I69328 Other speech and language deficits following cerebral infarction: Secondary | ICD-10-CM | POA: Diagnosis not present

## 2019-09-13 DIAGNOSIS — I700 Atherosclerosis of aorta: Secondary | ICD-10-CM | POA: Diagnosis not present

## 2019-09-13 DIAGNOSIS — G4733 Obstructive sleep apnea (adult) (pediatric): Secondary | ICD-10-CM | POA: Diagnosis present

## 2019-09-13 DIAGNOSIS — M546 Pain in thoracic spine: Secondary | ICD-10-CM | POA: Diagnosis not present

## 2019-09-13 DIAGNOSIS — R2681 Unsteadiness on feet: Secondary | ICD-10-CM | POA: Diagnosis not present

## 2019-09-13 DIAGNOSIS — D45 Polycythemia vera: Secondary | ICD-10-CM | POA: Diagnosis present

## 2019-09-13 DIAGNOSIS — M4856XD Collapsed vertebra, not elsewhere classified, lumbar region, subsequent encounter for fracture with routine healing: Secondary | ICD-10-CM | POA: Diagnosis present

## 2019-09-13 DIAGNOSIS — D751 Secondary polycythemia: Secondary | ICD-10-CM | POA: Diagnosis not present

## 2019-09-13 DIAGNOSIS — D473 Essential (hemorrhagic) thrombocythemia: Secondary | ICD-10-CM | POA: Diagnosis present

## 2019-09-13 DIAGNOSIS — M4854XA Collapsed vertebra, not elsewhere classified, thoracic region, initial encounter for fracture: Secondary | ICD-10-CM | POA: Diagnosis present

## 2019-09-13 DIAGNOSIS — R52 Pain, unspecified: Secondary | ICD-10-CM | POA: Diagnosis not present

## 2019-09-13 HISTORY — DX: Chronic diastolic (congestive) heart failure: I50.32

## 2019-09-13 LAB — BASIC METABOLIC PANEL
Anion gap: 8 (ref 5–15)
BUN: 12 mg/dL (ref 8–23)
CO2: 26 mmol/L (ref 22–32)
Calcium: 9.3 mg/dL (ref 8.9–10.3)
Chloride: 98 mmol/L (ref 98–111)
Creatinine, Ser: 0.59 mg/dL (ref 0.44–1.00)
GFR calc Af Amer: >60 mL/min (ref >60)
GFR calc non Af Amer: >60 mL/min (ref >60)
Glucose, Bld: 139 mg/dL — ABNORMAL HIGH (ref 70–99)
Potassium: 3.9 mmol/L (ref 3.5–5.1)
Sodium: 132 mmol/L — ABNORMAL LOW (ref 135–145)

## 2019-09-13 LAB — CBC WITH DIFFERENTIAL/PLATELET
Abs Immature Granulocytes: 0.03 10*3/uL (ref 0.00–0.07)
Basophils Absolute: 0 10*3/uL (ref 0.0–0.1)
Basophils Relative: 1 %
Eosinophils Absolute: 0 10*3/uL (ref 0.0–0.5)
Eosinophils Relative: 1 %
HCT: 47.4 % — ABNORMAL HIGH (ref 36.0–46.0)
Hemoglobin: 14.6 g/dL (ref 12.0–15.0)
Immature Granulocytes: 1 %
Lymphocytes Relative: 5 %
Lymphs Abs: 0.3 10*3/uL — ABNORMAL LOW (ref 0.7–4.0)
MCH: 26.5 pg (ref 26.0–34.0)
MCHC: 30.8 g/dL (ref 30.0–36.0)
MCV: 86 fL (ref 80.0–100.0)
Monocytes Absolute: 0.4 10*3/uL (ref 0.1–1.0)
Monocytes Relative: 6 %
Neutro Abs: 5.7 10*3/uL (ref 1.7–7.7)
Neutrophils Relative %: 86 %
Platelets: 492 10*3/uL — ABNORMAL HIGH (ref 150–400)
RBC: 5.51 MIL/uL — ABNORMAL HIGH (ref 3.87–5.11)
RDW: 23.7 % — ABNORMAL HIGH (ref 11.5–15.5)
WBC: 6.6 10*3/uL (ref 4.0–10.5)
nRBC: 0 % (ref 0.0–0.2)

## 2019-09-13 LAB — URINALYSIS, ROUTINE W REFLEX MICROSCOPIC
Bacteria, UA: NONE SEEN
Glucose, UA: NEGATIVE mg/dL
Hgb urine dipstick: NEGATIVE
Ketones, ur: 80 mg/dL — AB
Nitrite: NEGATIVE
Protein, ur: 30 mg/dL — AB
Specific Gravity, Urine: 1.028 (ref 1.005–1.030)
pH: 5 (ref 5.0–8.0)

## 2019-09-13 LAB — RESPIRATORY PANEL BY RT PCR (FLU A&B, COVID)
Influenza A by PCR: NEGATIVE
Influenza B by PCR: NEGATIVE
SARS Coronavirus 2 by RT PCR: NEGATIVE

## 2019-09-13 LAB — VITAMIN D 25 HYDROXY (VIT D DEFICIENCY, FRACTURES): Vit D, 25-Hydroxy: 27.26 ng/mL — ABNORMAL LOW (ref 30–100)

## 2019-09-13 MED ORDER — FENTANYL CITRATE (PF) 100 MCG/2ML IJ SOLN
25.0000 ug | Freq: Once | INTRAMUSCULAR | Status: AC
  Administered 2019-09-13: 25 ug via INTRAVENOUS
  Filled 2019-09-13: qty 2

## 2019-09-13 MED ORDER — OXYCODONE HCL ER 10 MG PO T12A
10.0000 mg | EXTENDED_RELEASE_TABLET | Freq: Two times a day (BID) | ORAL | Status: DC
  Administered 2019-09-13: 10 mg via ORAL
  Filled 2019-09-13: qty 1

## 2019-09-13 MED ORDER — POLYETHYLENE GLYCOL 3350 17 G PO PACK
17.0000 g | PACK | Freq: Once | ORAL | Status: DC
  Filled 2019-09-13: qty 1

## 2019-09-13 MED ORDER — POLYETHYLENE GLYCOL 3350 17 G PO PACK: 17.0000 g | PACK | Freq: Every day | ORAL | Status: DC | PRN

## 2019-09-13 MED ORDER — OXYCODONE-ACETAMINOPHEN 5-325 MG PO TABS
1.0000 | ORAL_TABLET | ORAL | Status: DC | PRN
  Administered 2019-09-13 – 2019-09-17 (×9): 1 via ORAL
  Filled 2019-09-13 (×9): qty 1

## 2019-09-13 MED ORDER — PANTOPRAZOLE SODIUM 40 MG PO TBEC
40.0000 mg | DELAYED_RELEASE_TABLET | Freq: Every day | ORAL | Status: DC
  Administered 2019-09-13 – 2019-09-17 (×5): 40 mg via ORAL
  Filled 2019-09-13 (×5): qty 1

## 2019-09-13 MED ORDER — HYDROCODONE-ACETAMINOPHEN 5-325 MG PO TABS
1.0000 | ORAL_TABLET | Freq: Once | ORAL | Status: AC
  Administered 2019-09-13: 1 via ORAL
  Filled 2019-09-13: qty 1

## 2019-09-13 MED ORDER — ACETAMINOPHEN 325 MG PO TABS: 650.0000 mg | ORAL_TABLET | Freq: Four times a day (QID) | ORAL | Status: DC | PRN

## 2019-09-13 MED ORDER — ONDANSETRON HCL 4 MG/2ML IJ SOLN
4.0000 mg | Freq: Four times a day (QID) | INTRAMUSCULAR | Status: DC | PRN
  Administered 2019-09-13: 4 mg via INTRAVENOUS
  Filled 2019-09-13: qty 2

## 2019-09-13 MED ORDER — ENOXAPARIN SODIUM 40 MG/0.4ML ~~LOC~~ SOLN
40.0000 mg | SUBCUTANEOUS | Status: DC
  Administered 2019-09-13 – 2019-09-14 (×2): 40 mg via SUBCUTANEOUS
  Filled 2019-09-13 (×2): qty 0.4

## 2019-09-13 MED ORDER — CALCITONIN (SALMON) 200 UNIT/ACT NA SOLN
1.0000 | Freq: Every day | NASAL | Status: DC
  Administered 2019-09-13 – 2019-09-17 (×5): 1 via NASAL
  Filled 2019-09-13: qty 3.7

## 2019-09-13 MED ORDER — IOHEXOL 350 MG/ML SOLN
100.0000 mL | Freq: Once | INTRAVENOUS | Status: AC | PRN
  Administered 2019-09-13: 100 mL via INTRAVENOUS

## 2019-09-13 MED ORDER — LACTULOSE 10 GM/15ML PO SOLN
10.0000 g | Freq: Once | ORAL | Status: AC
  Administered 2019-09-13: 10 g via ORAL
  Filled 2019-09-13: qty 15

## 2019-09-13 MED ORDER — ONDANSETRON HCL 4 MG PO TABS: 4.0000 mg | ORAL_TABLET | Freq: Four times a day (QID) | ORAL | Status: DC | PRN

## 2019-09-13 MED ORDER — MORPHINE SULFATE (PF) 4 MG/ML IV SOLN
4.0000 mg | INTRAVENOUS | Status: DC | PRN
  Administered 2019-09-13 – 2019-09-17 (×15): 4 mg via INTRAVENOUS
  Filled 2019-09-13 (×15): qty 1

## 2019-09-13 MED ORDER — HYDROXYUREA 500 MG PO CAPS
500.0000 mg | ORAL_CAPSULE | ORAL | Status: DC
  Administered 2019-09-14 – 2019-09-16 (×2): 500 mg via ORAL
  Filled 2019-09-13 (×2): qty 1

## 2019-09-13 MED ORDER — ACETAMINOPHEN 650 MG RE SUPP: 650.0000 mg | Freq: Four times a day (QID) | RECTAL | Status: DC | PRN

## 2019-09-13 MED ORDER — SODIUM CHLORIDE (PF) 0.9 % IJ SOLN
INTRAMUSCULAR | Status: AC
  Filled 2019-09-13: qty 50

## 2019-09-13 MED ORDER — ACETAMINOPHEN 325 MG PO TABS: 650.0000 mg | ORAL_TABLET | Freq: Two times a day (BID) | ORAL | Status: DC

## 2019-09-13 MED ORDER — POLYETHYLENE GLYCOL 3350 17 G PO PACK
17.0000 g | PACK | Freq: Every day | ORAL | Status: DC
  Administered 2019-09-13 – 2019-09-14 (×2): 17 g via ORAL
  Filled 2019-09-13: qty 1

## 2019-09-13 MED ORDER — ASPIRIN EC 81 MG PO TBEC
81.0000 mg | DELAYED_RELEASE_TABLET | Freq: Every day | ORAL | Status: DC
  Administered 2019-09-13 – 2019-09-17 (×4): 81 mg via ORAL
  Filled 2019-09-13 (×5): qty 1

## 2019-09-13 MED ORDER — LIDOCAINE 5 % EX PTCH
1.0000 | MEDICATED_PATCH | CUTANEOUS | Status: DC
  Administered 2019-09-13 – 2019-09-17 (×5): 1 via TRANSDERMAL
  Filled 2019-09-13 (×5): qty 1

## 2019-09-13 MED ORDER — DOCUSATE SODIUM 100 MG PO CAPS
100.0000 mg | ORAL_CAPSULE | Freq: Two times a day (BID) | ORAL | Status: DC
  Administered 2019-09-13 – 2019-09-17 (×9): 100 mg via ORAL
  Filled 2019-09-13 (×9): qty 1

## 2019-09-13 MED ORDER — LORAZEPAM 0.5 MG PO TABS
0.5000 mg | ORAL_TABLET | Freq: Two times a day (BID) | ORAL | Status: DC
  Administered 2019-09-13 – 2019-09-17 (×9): 0.5 mg via ORAL
  Filled 2019-09-13 (×9): qty 1

## 2019-09-13 MED ORDER — METOPROLOL TARTRATE 50 MG PO TABS
50.0000 mg | ORAL_TABLET | Freq: Two times a day (BID) | ORAL | Status: DC
  Administered 2019-09-13 – 2019-09-17 (×9): 50 mg via ORAL
  Filled 2019-09-13 (×9): qty 1

## 2019-09-13 MED ORDER — METHOCARBAMOL 500 MG PO TABS
1000.0000 mg | ORAL_TABLET | Freq: Once | ORAL | Status: AC
  Administered 2019-09-13: 1000 mg via ORAL
  Filled 2019-09-13: qty 2

## 2019-09-13 NOTE — ED Provider Notes (Signed)
[facility] DEPT Provider Note   CSN: [id number] Arrival date & time: 09/12/19  2224     History Chief Complaint  Patient presents with  . Back Pain    Shalisa Peduzzi is a 86 y.o. female.  Patient with history of nonobstructive coronary disease, CVA, previous DVT, hypertension hyperlipidemia previous history of breast cancer presenting with left-sided low back pain.  States this pain is been ongoing since she was seen in the ED on April 23.  She thinks it was due to doing too much yard work.  Did not have any direct fall or trauma.  She was sent to rehab from the ED where she is working with physical therapy and Occupational Therapy.  States has been taking Tylenol 3 using lidocaine patches.  She comes in tonight because the pain is not getting any better.  Denies any new injury or fall.  The pain is constant and worse with palpation and movement.  There is no radiation of the pain down her legs.  No bowel or bladder incontinence.  No fever or vomiting.  No pain with urination or blood in the urine.  She denies any weakness in her legs.  She denies any bowel or bladder incontinence.  She denies any fall or trauma.  She reports no change in her pain since her last visit.  She reports no new trauma.  The history is provided by the patient.  Back Pain Associated symptoms: no abdominal pain, no chest pain, no dysuria, no fever, no headaches and no weakness        Past Medical History:  Diagnosis Date  . CAD (coronary artery disease)    mild nonobstructive disease on cath in 2003  . Cancer ([place])   . Cardiomyopathy    Probable Takotsubo, severe CP w/ normal cath in 1994. Severe CP in 2003 w/ widespread T wave inversions on ECG. Cath w/ minimal coronary disease but LV-gram showed periapical severe hypokinesis and basilar hyperkinesia (EF 40%). Last echo in 4/09 confirmed full LV functional recovery with EF 60%, no regional wall motion abnormalities, mild to  moderate LVH.  Marland Kitchen CVA (cerebral infarction)   . Depression with anxiety 03-29-11   lost husband 3'09  . Diverticulitis   . DVT (deep venous thrombosis) ([place])    after venous ablation, R leg  . E. coli gastroenteritis 03-29-11   8'10  . Fracture 09/10/07   L2, status post vertebroplasty of L2 performed by IR  . GERD (gastroesophageal reflux disease)   . Headache(784.0)    migraines  . Hemorrhoids   . Hiatal hernia 03-29-11   no nerve problems  . Hyperlipidemia   . Hyperplastic colon polyp 06/2001  . Hypertension   . Iron deficiency anemia due to chronic blood loss 12/30/2017  . OA (osteoarthritis) of knee 03-29-11   w/ bilateral knee pain-not a problem now  . OSA (obstructive sleep apnea) 03-29-11   no cpap used, not a problem now.  . Osteoporosis   . Otalgia of both ears    Dr. Simeon Craft  . Polycythemia   . Stroke [facility]) 03-29-11   CVA x2 -last 10'12-?TIA(visual problems)  . Varicose vein     Patient Active Problem List   Diagnosis Date Noted  . Raynaud's phenomenon without gangrene 05/19/2018  . Iron deficiency anemia due to chronic blood loss 12/30/2017  . Insomnia 06/25/2017  . Mild CAD 05/24/2017  . PCP NOTES >>> 02/15/2015  . Superficial thrombophlebitis 04/06/2014  . Annual physical  exam 08/18/2012  . Edema 04/14/2012  . Breast cancer ([place]) 11/05/2011  . Polycythemia 11/05/2011  . TIA (transient ischemic attack) 01/16/2011  . CARDIOMYOPATHY 08/03/2009  . DJD -- pain mgmt 08/16/2008  . ABDOMINAL PAIN, LEFT LOWER QUADRANT 07/05/2008  . GAIT DISTURBANCE 10/21/2007  . ANXIETY DEPRESSION 10/08/2007  . Hyperlipidemia 06/25/2007  . Takotsubo cardiomyopathy 06/25/2007  . GERD and Hiatal Hernia  06/25/2007  . Essential hypertension 09/30/2006  . Osteoporosis 09/30/2006  . Migraine with aura 09/30/2006    Past Surgical History:  Procedure Laterality Date  . APPENDECTOMY    . BACK SURGERY    . BREAST BIOPSY    . BREAST CYST EXCISION    . BREAST LUMPECTOMY Left 2013     left stage I left breast cancer  . CHOLECYSTECTOMY  04/09/2011   Procedure: LAPAROSCOPIC CHOLECYSTECTOMY WITH INTRAOPERATIVE CHOLANGIOGRAM;  Surgeon: Odis Hollingshead, MD;  Location: [facility] ORS;  Service: General;  Laterality: N/A;  . Dental Extraction     L maxillary molar  . KYPHOSIS SURGERY  08/2007   cement used  . OVARIAN CYST SURGERY     left  . TONSILLECTOMY    . TOTAL KNEE ARTHROPLASTY  06/2010   left  . TUBAL LIGATION       OB History    Gravida  3   Para  3   Term      Preterm      AB      Living  3     SAB      TAB      Ectopic      Multiple      Live Births              Family History  Problem Relation Age of Onset  . Breast cancer Mother   . Heart disease Father        MIs  . Colon cancer Other        cousin  . Breast cancer Sister   . Leukemia Brother        GM    Social History   Tobacco Use  . Smoking status: Former Smoker    Packs/day: 1.00    Years: 37.00    Pack years: 37.00    Types: Cigarettes    Start date: 06/26/1949    Quit date: 05/14/1988    Years since quitting: 31.3  . Smokeless tobacco: Never Used  . Tobacco comment: quit 25 years ago  Substance Use Topics  . Alcohol use: Yes    Alcohol/week: 1.0 standard drinks    Types: 1 Glasses of wine per week    Comment: occasional/social  . Drug use: No    Home Medications Prior to Admission medications   Medication Sig Start Date End Date Taking? Authorizing Provider  acetaminophen (TYLENOL 8 HOUR) 650 MG CR tablet Take 1 tablet (650 mg total) by mouth every 8 (eight) hours. 09/06/19   Davonna Belling, MD  acetaminophen-codeine (TYLENOL #3) 300-30 MG tablet TAKE 1 TABLET BY MOUTH EVERY 8 HOURS AS NEEDED FOR MIGRAINE 09/06/19   Davonna Belling, MD  aspirin 81 MG tablet Take 81 mg by mouth daily.    [provider]  bismuth subsalicylate (PEPTO BISMOL) 262 MG/15ML suspension Take 30 mLs by mouth every other day.    [provider]  hydrochlorothiazide  (HYDRODIURIL) 25 MG tablet Take 1 tablet (25 mg total) by mouth daily as needed (swelling in the legs ). 07/05/16   Kathlene November  E, MD  hydroxyurea (HYDREA) 500 MG capsule Take 500 mg by mouth every other day. May take with food to minimize GI side effects.    [provider]  Lidocaine 4 % PTCH Apply 1 patch topically 2 (two) times daily. 09/06/19   Davonna Belling, MD  LORazepam (ATIVAN) 0.5 MG tablet TAKE 1/2 TABLET BY MOUTH EVERY MORNING AND EVERY AFTERNOON. THEN TAKE 2 TABLETS BY MOUTH AT BEDTIME AS NEEDED Patient taking differently: Take 0.25-1 mg by mouth See admin instructions. TAKE 0.25 BY MOUTH EVERY MORNING AND EVERY AFTERNOON. THEN TAKE 1mg  BY MOUTH AT BEDTIME AS NEEDED 08/19/19   Colon Branch, MD  metoprolol tartrate (LOPRESSOR) 50 MG tablet Take 1 tablet (50 mg total) by mouth 2 (two) times daily. 06/08/19   Colon Branch, MD  mometasone (ELOCON) 0.1 % cream Apply 1 application topically daily. Patient not taking: Reported on 09/04/2019 03/13/18   Volanda Napoleon, MD  omeprazole (PRILOSEC) 20 MG capsule Take 20 mg by mouth daily.    [provider]  tizanidine (ZANAFLEX) 2 MG capsule Take 1 capsule (2 mg total) by mouth 3 (three) times daily as needed for muscle spasms. 09/06/19   Davonna Belling, MD  valACYclovir (VALTREX) 500 MG tablet Take 2 tablets at onset of rash and 2 tablets 12 hours later as needed. 02/16/19   [place], Holli Humbles, NP    Allergies    Penicillins, Valsartan, Atorvastatin, Carvedilol, Ezetimibe, Fluvastatin sodium, Magnesium hydroxide, Meloxicam, Omeprazole magnesium, Pneumovax [pneumococcal polysaccharide vaccine], Quinapril hcl, Simvastatin, Topamax [topiramate], and Vit d-vit e-safflower oil  Review of Systems   Review of Systems  Constitutional: Negative for activity change, appetite change and fever.  HENT: Negative for congestion and rhinorrhea.   Eyes: Negative for visual disturbance.  Respiratory: Negative for cough, chest tightness and  shortness of breath.   Cardiovascular: Negative for chest pain.  Gastrointestinal: Negative for abdominal pain, nausea and vomiting.  Genitourinary: Positive for flank pain. Negative for dysuria and hematuria.  Musculoskeletal: Positive for arthralgias, back pain and myalgias.  Skin: Negative for rash.  Neurological: Negative for dizziness, weakness and headaches.   all other systems are negative except as noted in the HPI and PMH.    Physical Exam Updated Vital Signs BP (!) 164/74 (BP Location: Right Arm)   Pulse 67   Temp 98.1 F (36.7 C) (Oral)   Resp 18   SpO2 97%   Physical Exam Vitals and nursing note reviewed.  Constitutional:      General: She is not in acute distress.    Appearance: She is well-developed.  HENT:     Head: Normocephalic and atraumatic.     Mouth/Throat:     Pharynx: No oropharyngeal exudate.  Eyes:     Conjunctiva/sclera: Conjunctivae normal.     Pupils: Pupils are equal, round, and reactive to light.  Neck:     Comments: No meningismus. Cardiovascular:     Rate and Rhythm: Normal rate and regular rhythm.     Heart sounds: Normal heart sounds. No murmur.  Pulmonary:     Effort: Pulmonary effort is normal. No respiratory distress.     Breath sounds: Normal breath sounds.  Abdominal:     Palpations: Abdomen is soft.     Tenderness: There is no abdominal tenderness. There is no guarding or rebound.  Musculoskeletal:        General: No tenderness. Normal range of motion.     Cervical back: Normal range of motion and neck supple.  Comments: Left paraspinal lumbar tenderness very tender to palpation.  No appreciable rash.  No midline tenderness  4/5 strength in bilateral lower extremities. Ankle plantar and dorsiflexion intact. Great toe extension intact bilaterally. +2 DP and PT pulses. Unable to elicit patellar reflexes bilaterally.    Skin:    General: Skin is warm.  Neurological:     Mental Status: She is alert and oriented to person,  place, and time.     Cranial Nerves: No cranial nerve deficit.     Motor: No abnormal muscle tone.     Coordination: Coordination normal.     Comments: No ataxia on finger to nose bilaterally. No pronator drift. 5/5 strength throughout. CN 2-12 intact.Equal grip strength. Sensation intact.   Psychiatric:        Behavior: Behavior normal.     ED Results / Procedures / Treatments   Labs (all labs ordered are listed, but only abnormal results are displayed) Labs Reviewed  CBC WITH DIFFERENTIAL/PLATELET - Abnormal; Notable for the following components:      Result Value   RBC 5.51 (*)    HCT 47.4 (*)    RDW 23.7 (*)    Platelets 492 (*)    Lymphs Abs 0.3 (*)    All other components within normal limits  BASIC METABOLIC PANEL - Abnormal; Notable for the following components:   Sodium 132 (*)    Glucose, Bld 139 (*)    All other components within normal limits  URINALYSIS, ROUTINE W REFLEX MICROSCOPIC - Abnormal; Notable for the following components:   Bilirubin Urine SMALL (*)    Ketones, ur 80 (*)    Protein, ur 30 (*)    Leukocytes,Ua TRACE (*)    All other components within normal limits  RESPIRATORY PANEL BY RT PCR (FLU A&B, COVID)    EKG None  Radiology CT Angio Chest/Abd/Pel for Dissection W and/or Wo Contrast  Result Date: 09/13/2019 CLINICAL DATA:  Left flank pain. Low back pain. Concern for acute aortic syndrome. EXAM: CT ANGIOGRAPHY CHEST, ABDOMEN AND PELVIS TECHNIQUE: Non-contrast CT of the chest was initially obtained. Multidetector CT imaging through the chest, abdomen and pelvis was performed using the standard protocol during bolus administration of intravenous contrast. Multiplanar reconstructed images and MIPs were obtained and reviewed to evaluate the vascular anatomy. CONTRAST:  163mL OMNIPAQUE IOHEXOL 350 MG/ML SOLN COMPARISON:  218010 CT abdomen/pelvis. 06/24/2018 chest radiograph. 01/20/2015 chest CT angiogram. FINDINGS: CTA CHEST FINDINGS Cardiovascular:  Top-normal heart size. No significant pericardial effusion/thickening. Three-vessel coronary atherosclerosis. Atherosclerotic nonaneurysmal thoracic aorta. No acute intramural hematoma, dissection, pseudoaneurysm or penetrating atherosclerotic ulcer in the thoracic aorta. Aortic arch branch vessels are patent. Normal caliber pulmonary arteries. No central pulmonary emboli. Mediastinum/Nodes: No discrete thyroid nodules. Unremarkable esophagus. No pathologically enlarged axillary, mediastinal or hilar lymph nodes. Coarsely calcified granulomatous subcarinal and right hilar nodes. Lungs/Pleura: No pneumothorax. No pleural effusion. Mild compressive atelectasis in the medial lower lobes from the hiatal hernia. No acute consolidative airspace disease, lung masses or significant pulmonary nodules. Musculoskeletal: No aggressive appearing focal osseous lesions. Diffuse osteopenia. Mild to moderate T9 vertebral compression fracture, new since 01/20/2015 CT. Moderate thoracic spondylosis. Review of the MIP images confirms the above findings. CTA ABDOMEN AND PELVIS FINDINGS VASCULAR Aorta: Atherosclerotic nonaneurysmal abdominal aorta with no abdominal aortic dissection. Celiac: Moderate (approximately 50-60%) celiac atherosclerotic stenosis at the origin. SMA: Patent without evidence of aneurysm, dissection, vasculitis or significant stenosis. Renals: Both renal arteries are patent without evidence of aneurysm, dissection, vasculitis, fibromuscular dysplasia  or significant stenosis. IMA: Probable moderate ostial stenosis by atherosclerotic plaque. Inflow: Patent without evidence of aneurysm, dissection, vasculitis or significant stenosis. Veins: No obvious venous abnormality within the limitations of this arterial phase study. Review of the MIP images confirms the above findings. NON-VASCULAR Hepatobiliary: Normal liver size. Scattered tiny liver granulomatous calcifications. No liver masses. Cholecystectomy. Bile ducts are  stable and within normal post cholecystectomy limits with CBD diameter 9 mm. Pancreas: Normal, with no mass or duct dilation. Spleen: Normal size spleen. Scattered granulomatous splenic calcifications. No splenic masses. Adrenals/Urinary Tract: Normal adrenals. No renal stones. No hydronephrosis. No contour deforming renal masses. Normal bladder. Stomach/Bowel: Moderate hiatal hernia. Otherwise normal nondistended stomach. Normal caliber small bowel with no small bowel wall thickening. Appendectomy. Mild sigmoid diverticulosis with no large bowel wall thickening or significant pericolonic fat stranding. Vascular/Lymphatic: No pathologically enlarged lymph nodes in the abdomen or pelvis. Reproductive: Stable coarsely calcified 1.3 cm left uterine fibroid. No adnexal masses. Other: No pneumoperitoneum, ascites or focal fluid collection. Musculoskeletal: No aggressive appearing focal osseous lesions. Chronic moderate L2 vertebral compression fracture status post vertebroplasty. Chronic moderate L3 and L4 vertebral compression fractures are unchanged. Diffuse osteopenia. Moderate lumbar spondylosis. Review of the MIP images confirms the above findings. IMPRESSION: 1. No acute aortic syndrome. 2. Mild to moderate T9 vertebral compression fracture, new since [id number] CT, of uncertain chronicity, possibly acute. Chronic L2, L3 and L4 vertebral compression fractures. 3. Three-vessel coronary atherosclerosis. 4. Moderate hiatal hernia. 5. Mild sigmoid diverticulosis. 6. Aortic Atherosclerosis (ICD10-I70.0). Additional chronic findings as detailed. Electronically Signed   By: Ilona Sorrel M.D.   On: 09/13/2019 05:03    Procedures Procedures (including critical care time)  Medications Ordered in ED Medications  fentaNYL (SUBLIMAZE) injection 25 mcg (25 mcg Intravenous Given 09/13/19 0009)    ED Course  I have reviewed the triage vital signs and the nursing notes.  Pertinent labs & imaging results that were  available during my care of the patient were reviewed by me and considered in my medical decision making (see chart for details).    MDM Rules/Calculators/A&P                     Return visit for left-sided low back pain.  No radiation of the pain down her legs.  No neurological deficits.  No bowel bladder incontinence.  No fever or vomiting.  Low suspicion for cord compression or cauda equina.   Patient feels that her pain is not being adequately controlled at her nursing facility. UA negative. But large ketones.  CT scan today shows no aortic aneurysm.  Does show chronic lumbar compression fractures and a new compression fracture at T9.  No evidence of AAA or aortic dissection.  Patient still with intractable pain and unable to sit up or ambulate.  She is having multiple doses of pain medication including narcotics. She normally lives in her own but has been in rehab for the past 3 days.  Does not feel like the facility is controlling her pain adequately.  She cannot sit up or ambulate at all. She does not want to go back to [place].  She is agreeable to admission for pain control and PT evaluation.  D/w Dr. Marlyce Huge. Final Clinical Impression(s) / ED Diagnoses Final diagnoses:  Intractable back pain  Compression fracture of T9 vertebra with routine healing, subsequent encounter    Rx / DC Orders ED Discharge Orders    None       Edina Winningham,  Annie Main, MD 09/13/19 [phone]

## 2019-09-13 NOTE — H&P (Signed)
History and Physical    [name] [id number] DOB: [birth year] DOA: 09/12/2019  PCP: Colon Branch, MD  [facility]   Chief Complaint:  Chief Complaint  Patient presents with  . Back Pain     HPI:   86 year old female with past medical history of coronary artery disease, depression, anxiety, gastroesophageal reflux disease, hyperlipidemia, hypertension, iron deficiency anemia and polycythemia vera who presents to [facility] with complaints of severe back pain.  Patient initially presented to [facility] emergency department on 4/23 after experiencing severe midline back pain after working several days in her yard.  Upon radiographic evaluation during that ED presentation fractures of the spine could not be identified.  Arrangements were then made for the patient to be discharged to a skilled nursing facility for physical therapy and modest analgesia.  Over the next several days while the patient was at [place], her pain has been so severe that she has been unable to participate in any degree of physical therapy.  Upon further questioning patient states her pain is severe in intensity, sharp in quality, located along the mid thoracic spine, nonradiating and worse with movement.  Patient denies any diarrhea or any dysuria.  Due to patient's continued severe back pain preventing her from participating in physical therapy at the skilled nursing facility sent the patient to [facility] emergency department for repeat evaluation.  CT angiogram of the chest, abdomen, pelvis revealed a new T9 compression fracture thought to be the likely cause of the patient's severe intractable pain.  The hospitalist group has now been called to assess the patient for admission the hospital for continued management of intractable back pain and consideration of kyphoplasty.    Review of Systems: A  10-system review of systems has been performed and all systems are negative with the exception of what is listed in the HPI.   Past Medical History:  Diagnosis Date  . CAD (coronary artery disease)    mild nonobstructive disease on cath in 2003  . Cancer ([place])   . Cardiomyopathy    Probable Takotsubo, severe CP w/ normal cath in 1994. Severe CP in 2003 w/ widespread T wave inversions on ECG. Cath w/ minimal coronary disease but LV-gram showed periapical severe hypokinesis and basilar hyperkinesia (EF 40%). Last echo in 4/09 confirmed full LV functional recovery with EF 60%, no regional wall motion abnormalities, mild to moderate LVH.  Marland Kitchen CVA (cerebral infarction)   . Depression with anxiety 03-29-11   lost husband 3'09  . Diverticulitis   . DVT (deep venous thrombosis) ([place])    after venous ablation, R leg  . E. coli gastroenteritis 03-29-11   8'10  . Fracture 09/10/07   L2, status post vertebroplasty of L2 performed by IR  . GERD (gastroesophageal reflux disease)   . Headache(784.0)    migraines  . Hemorrhoids   . Hiatal hernia 03-29-11   no nerve problems  . Hyperlipidemia   . Hyperplastic colon polyp 06/2001  . Hypertension   . Iron deficiency anemia due to chronic blood loss 12/30/2017  . OA (osteoarthritis) of knee 03-29-11   w/ bilateral knee pain-not a problem now  . OSA (obstructive sleep apnea) 03-29-11   no cpap used, not a problem now.  . Osteoporosis   . Otalgia of both ears    Dr. Simeon Craft  . Polycythemia   . Stroke [facility]) 03-29-11   CVA x2 -last  10'12-?TIA(visual problems)  . Varicose vein     Past Surgical History:  Procedure Laterality Date  . APPENDECTOMY    . BACK SURGERY    . BREAST BIOPSY    . BREAST CYST EXCISION    . BREAST LUMPECTOMY Left 2013   left stage I left breast cancer  . CHOLECYSTECTOMY  04/09/2011   Procedure: LAPAROSCOPIC CHOLECYSTECTOMY WITH INTRAOPERATIVE CHOLANGIOGRAM;  Surgeon: Odis Hollingshead, MD;  Location: [facility] ORS;  Service:  General;  Laterality: N/A;  . Dental Extraction     L maxillary molar  . KYPHOSIS SURGERY  08/2007   cement used  . OVARIAN CYST SURGERY     left  . TONSILLECTOMY    . TOTAL KNEE ARTHROPLASTY  06/2010   left  . TUBAL LIGATION       reports that she quit smoking about 31 years ago. Her smoking use included cigarettes. She started smoking about 68 years ago. She has a 37.00 pack-year smoking history. She has never used smokeless tobacco. She reports current alcohol use of about 1.0 standard drinks of alcohol per week. She reports that she does not use drugs.  Allergies  Allergen Reactions  . Penicillins Hives, Rash and Other (See Comments)    Has patient had a PCN reaction causing immediate rash, facial/tongue/throat swelling, SOB or lightheadedness with hypotension: Yes Has patient had a PCN reaction causing severe rash involving mucus membranes or skin necrosis: yes Has patient had a PCN reaction that required hospitalization: No Has patient had a PCN reaction occurring within the last 10 years: no If all of the above answers are "NO", then may proceed with Cephalosporin use.  Other Reaction: OTHER REACTION  . Valsartan Itching and Other (See Comments)  . Atorvastatin Diarrhea  . Carvedilol Other (See Comments)    "teeth hurt when I took it"   . Ezetimibe Nausea Only  . Fluvastatin Sodium Other (See Comments)    "Can't remember"  . Magnesium Hydroxide Other (See Comments)    REACTION: triggers HAs  . Meloxicam Other (See Comments)    Pt seeing auro's / spots.    . Omeprazole Magnesium Other (See Comments)    auras  . Pneumovax [Pneumococcal Polysaccharide Vaccine] Other (See Comments)    Local reaction  . Quinapril Hcl Other (See Comments)     "feelings of tiredness"  . Simvastatin Diarrhea  . Topamax [Topiramate] Itching  . Vit D-Vit E-Safflower Oil Other (See Comments)    Headaches    Family History  Problem Relation Age of Onset  . Breast cancer Mother   . Heart  disease Father        MIs  . Colon cancer Other        cousin  . Breast cancer Sister   . Leukemia Brother        GM     Prior to Admission medications   Medication Sig Start Date End Date Taking? Authorizing Provider  acetaminophen (TYLENOL 8 HOUR) 650 MG CR tablet Take 1 tablet (650 mg total) by mouth every 8 (eight) hours. 09/06/19  Yes Davonna Belling, MD  aspirin 81 MG tablet Take 81 mg by mouth daily.   Yes [provider]  docusate sodium (COLACE) 100 MG capsule Take 100 mg by mouth 2 (two) times daily.   Yes [provider]  HYDROcodone-acetaminophen (NORCO) 10-325 MG tablet Take 1 tablet by mouth every 8 (eight) hours as needed for moderate pain or severe pain.   Yes [provider]  hydroxyurea (HYDREA) 500 MG capsule Take 500 mg by mouth every other day. May take with food to minimize GI side effects.   Yes [provider]  Lidocaine 4 % PTCH Apply 1 patch topically 2 (two) times daily. Patient taking differently: Apply 1 patch topically daily. Remove after 12 hours 09/06/19  Yes Davonna Belling, MD  LORazepam (ATIVAN) 0.5 MG tablet TAKE 1/2 TABLET BY MOUTH EVERY MORNING AND EVERY AFTERNOON. THEN TAKE 2 TABLETS BY MOUTH AT BEDTIME AS NEEDED Patient taking differently: Take 0.25-1 mg by mouth See admin instructions. TAKE 0.25 BY MOUTH EVERY MORNING AND EVERY AFTERNOON. THEN TAKE 1mg  BY MOUTH AT BEDTIME AS NEEDED 08/19/19  Yes Colon Branch, MD  metoprolol tartrate (LOPRESSOR) 50 MG tablet Take 1 tablet (50 mg total) by mouth 2 (two) times daily. 06/08/19  Yes Paz, Alda Berthold, MD  omeprazole (PRILOSEC) 20 MG capsule Take 20 mg by mouth daily.   Yes [provider]  polyethylene glycol (MIRALAX / GLYCOLAX) 17 g packet Take 17 g by mouth daily.   Yes [provider]  acetaminophen-codeine (TYLENOL #3) 300-30 MG tablet TAKE 1 TABLET BY MOUTH EVERY 8 HOURS AS NEEDED FOR MIGRAINE Patient not taking: Reported on 09/13/2019 09/06/19   Davonna Belling, MD  bismuth subsalicylate (PEPTO BISMOL) 262 MG/15ML suspension Take 30 mLs by mouth daily as needed for indigestion or diarrhea or loose stools.     [provider]  hydrochlorothiazide (HYDRODIURIL) 25 MG tablet Take 1 tablet (25 mg total) by mouth daily as needed (swelling in the legs ). Patient not taking: Reported on 09/13/2019 07/05/16   Colon Branch, MD  mometasone (ELOCON) 0.1 % cream Apply 1 application topically daily. Patient not taking: Reported on 09/04/2019 03/13/18   Volanda Napoleon, MD  tizanidine (ZANAFLEX) 2 MG capsule Take 1 capsule (2 mg total) by mouth 3 (three) times daily as needed for muscle spasms. 09/06/19   Davonna Belling, MD  valACYclovir (VALTREX) 500 MG tablet Take 2 tablets at onset of rash and 2 tablets 12 hours later as needed. Patient not taking: Reported on 09/13/2019 02/16/19   Eliezer Bottom, NP    Physical Exam: Vitals:   09/12/19 2236 09/13/19 0134 09/13/19 0332 09/13/19 0517  BP:  130/74 (!) 154/95 (!) 159/72  Pulse:  68 79 74  Resp:  15 20 17   Temp:      TempSrc:      SpO2: 97% 95% 97% 93%  Weight:   72.6 kg   Height:   5\' 6"  (1.676 m)     Constitutional: Acute alert and oriented x3, in distress due to pain Skin: no rashes, no lesions, good skin turgor noted. Eyes: Pupils are equally reactive to light.  No evidence of scleral icterus or conjunctival pallor.  ENMT: Moist mucous membranes noted.  Posterior pharynx clear of any exudate or lesions.   Neck: normal, supple, no masses, no thyromegaly.  No evidence of jugular venous distension.   Respiratory: clear to auscultation bilaterally, no wheezing, no crackles. Normal respiratory effort. No accessory muscle use.  Cardiovascular: Regular rate and rhythm, no murmurs / rubs / gallops. No extremity edema. 2+ pedal pulses. No carotid bruits.  Chest:   Nontender without crepitus or deformity.   Back:   Exquisite midline tenderness without crepitus or deformity.   Abdomen: Abdomen is  soft and nontender.  No evidence of intra-abdominal masses.  Positive bowel sounds noted in all quadrants.   Musculoskeletal: No joint deformity upper  and lower extremities. Good ROM, no contractures. Normal muscle tone.  Neurologic: Sensation intact, patient is moving all 4 extremities spontaneously patient is following all commands.  Patient is responsive to verbal stimuli.   Psychiatric: Patient presents as a normal mood with appropriate affect.  Patient seems to possess insight as to theircurrent situation.     Labs on Admission: I have personally reviewed following labs and imaging studies -   CBC: Recent Labs  Lab 09/12/19 2359  WBC 6.6  NEUTROABS 5.7  HGB 14.6  HCT 47.4*  MCV 86.0  PLT [id number]*   Basic Metabolic Panel: Recent Labs  Lab 09/12/19 2359  NA 132*  K 3.9  CL 98  CO2 26  GLUCOSE 139*  BUN 12  CREATININE 0.59  CALCIUM 9.3   GFR: Estimated Creatinine Clearance: 49.6 mL/min (by C-G formula based on SCr of 0.59 mg/dL). Liver Function Tests: No results for input(s): AST, ALT, ALKPHOS, BILITOT, PROT, ALBUMIN in the last 168 hours. No results for input(s): LIPASE, AMYLASE in the last 168 hours. No results for input(s): AMMONIA in the last 168 hours. Coagulation Profile: No results for input(s): INR, PROTIME in the last 168 hours. Cardiac Enzymes: No results for input(s): CKTOTAL, CKMB, CKMBINDEX, TROPONINI in the last 168 hours. BNP (last 3 results) No results for input(s): PROBNP in the last 8760 hours. HbA1C: No results for input(s): HGBA1C in the last 72 hours. CBG: No results for input(s): GLUCAP in the last 168 hours. Lipid Profile: No results for input(s): CHOL, HDL, LDLCALC, TRIG, CHOLHDL, LDLDIRECT in the last 72 hours. Thyroid Function Tests: No results for input(s): TSH, T4TOTAL, FREET4, T3FREE, THYROIDAB in the last 72 hours. Anemia Panel: No results for input(s): VITAMINB12, FOLATE, FERRITIN, TIBC, IRON, RETICCTPCT in the last 72 hours. Urine  analysis:    Component Value Date/Time   COLORURINE YELLOW 09/12/2019 2257   APPEARANCEUR CLEAR 09/12/2019 2257   LABSPEC 1.028 09/12/2019 2257   LABSPEC 1.010 05/27/2013 1433   PHURINE 5.0 09/12/2019 2257   GLUCOSEU NEGATIVE 09/12/2019 2257   GLUCOSEU NEGATIVE 09/02/2017 1347   HGBUR NEGATIVE 09/12/2019 2257   HGBUR negative 10/06/2009 1015   BILIRUBINUR SMALL (A) 09/12/2019 2257   BILIRUBINUR Negative 01/21/2018 1618   KETONESUR 80 (A) 09/12/2019 2257   PROTEINUR 30 (A) 09/12/2019 2257   UROBILINOGEN negative (A) 01/21/2018 1618   UROBILINOGEN 0.2 09/02/2017 1347   UROBILINOGEN 0.2 05/27/2013 1433   NITRITE NEGATIVE 09/12/2019 2257   LEUKOCYTESUR TRACE (A) 09/12/2019 2257    Radiological Exams on Admission - Personally Reviewed: CT Angio Chest/Abd/Pel for Dissection W and/or Wo Contrast  Result Date: 09/13/2019 CLINICAL DATA:  Left flank pain. Low back pain. Concern for acute aortic syndrome. EXAM: CT ANGIOGRAPHY CHEST, ABDOMEN AND PELVIS TECHNIQUE: Non-contrast CT of the chest was initially obtained. Multidetector CT imaging through the chest, abdomen and pelvis was performed using the standard protocol during bolus administration of intravenous contrast. Multiplanar reconstructed images and MIPs were obtained and reviewed to evaluate the vascular anatomy. CONTRAST:  180mL OMNIPAQUE IOHEXOL 350 MG/ML SOLN COMPARISON:  218010 CT abdomen/pelvis. 06/24/2018 chest radiograph. 01/20/2015 chest CT angiogram. FINDINGS: CTA CHEST FINDINGS Cardiovascular: Top-normal heart size. No significant pericardial effusion/thickening. Three-vessel coronary atherosclerosis. Atherosclerotic nonaneurysmal thoracic aorta. No acute intramural hematoma, dissection, pseudoaneurysm or penetrating atherosclerotic ulcer in the thoracic aorta. Aortic arch branch vessels are patent. Normal caliber pulmonary arteries. No central pulmonary emboli. Mediastinum/Nodes: No discrete thyroid nodules. Unremarkable esophagus. No  pathologically enlarged axillary, mediastinal or hilar lymph nodes. Coarsely  calcified granulomatous subcarinal and right hilar nodes. Lungs/Pleura: No pneumothorax. No pleural effusion. Mild compressive atelectasis in the medial lower lobes from the hiatal hernia. No acute consolidative airspace disease, lung masses or significant pulmonary nodules. Musculoskeletal: No aggressive appearing focal osseous lesions. Diffuse osteopenia. Mild to moderate T9 vertebral compression fracture, new since 01/20/2015 CT. Moderate thoracic spondylosis. Review of the MIP images confirms the above findings. CTA ABDOMEN AND PELVIS FINDINGS VASCULAR Aorta: Atherosclerotic nonaneurysmal abdominal aorta with no abdominal aortic dissection. Celiac: Moderate (approximately 50-60%) celiac atherosclerotic stenosis at the origin. SMA: Patent without evidence of aneurysm, dissection, vasculitis or significant stenosis. Renals: Both renal arteries are patent without evidence of aneurysm, dissection, vasculitis, fibromuscular dysplasia or significant stenosis. IMA: Probable moderate ostial stenosis by atherosclerotic plaque. Inflow: Patent without evidence of aneurysm, dissection, vasculitis or significant stenosis. Veins: No obvious venous abnormality within the limitations of this arterial phase study. Review of the MIP images confirms the above findings. NON-VASCULAR Hepatobiliary: Normal liver size. Scattered tiny liver granulomatous calcifications. No liver masses. Cholecystectomy. Bile ducts are stable and within normal post cholecystectomy limits with CBD diameter 9 mm. Pancreas: Normal, with no mass or duct dilation. Spleen: Normal size spleen. Scattered granulomatous splenic calcifications. No splenic masses. Adrenals/Urinary Tract: Normal adrenals. No renal stones. No hydronephrosis. No contour deforming renal masses. Normal bladder. Stomach/Bowel: Moderate hiatal hernia. Otherwise normal nondistended stomach. Normal caliber small  bowel with no small bowel wall thickening. Appendectomy. Mild sigmoid diverticulosis with no large bowel wall thickening or significant pericolonic fat stranding. Vascular/Lymphatic: No pathologically enlarged lymph nodes in the abdomen or pelvis. Reproductive: Stable coarsely calcified 1.3 cm left uterine fibroid. No adnexal masses. Other: No pneumoperitoneum, ascites or focal fluid collection. Musculoskeletal: No aggressive appearing focal osseous lesions. Chronic moderate L2 vertebral compression fracture status post vertebroplasty. Chronic moderate L3 and L4 vertebral compression fractures are unchanged. Diffuse osteopenia. Moderate lumbar spondylosis. Review of the MIP images confirms the above findings. IMPRESSION: 1. No acute aortic syndrome. 2. Mild to moderate T9 vertebral compression fracture, new since [id number] CT, of uncertain chronicity, possibly acute. Chronic L2, L3 and L4 vertebral compression fractures. 3. Three-vessel coronary atherosclerosis. 4. Moderate hiatal hernia. 5. Mild sigmoid diverticulosis. 6. Aortic Atherosclerosis (ICD10-I70.0). Additional chronic findings as detailed. Electronically Signed   By: Ilona Sorrel M.D.   On: 09/13/2019 05:03      Assessment/Plan Principal Problem:   Intractable back pain   Patient experiencing intractable midline back pain likely secondary to new T9 compression fracture seen on CT imaging  Unfortunately initial work-up in our emergency department on 4/23 missed the compression fracture despite x-ray imaging  Patient's back pain was so severe at her skilled nursing facility the past several days she has been absolutely unable to work with physical therapy  Will place patient on scheduled opiate therapy, OxyContin 10 mg every 12 hours with intranasal calcitonin daily  Will obtain vitamin D level  We will order physical therapy evaluation  If we are unable to achieve excellent pain control in the next 24 hours we will arrange for  kyphoplasty during this hospitalization on Monday by interventional radiology.  Active Problems:   Compression fracture of T9 vertebra (HCC)  Please see assessment and plan above    Essential hypertension   Continue home regimen antihypertensive therapy    GERD without esophagitis  Continue home regimen of PPI    Code Status:  Full code Family Communication: Deferred  Status is: Observation  The patient remains OBS appropriate and will d/c before  2 midnights.  Dispo: The patient is from: SNF              Anticipated d/c is to: SNF              Anticipated d/c date is: 2 days              Patient currently is not medically stable to d/c.        Vernelle Emerald MD [facility] Pager [phone]  If 7PM-7AM, please contact night-coverage [email or web address] Use universal [facility] password for that web site. If you do not have the password, please call the hospital operator.  09/13/2019, 7:02 AM

## 2019-09-13 NOTE — Progress Notes (Addendum)
  Request seen for evaluation for T9 Kyphoplasty.  Will ask IR MD to evaluate images on Monday.  If patient is a good candidate for cement augmentation, will then proceed with Insurance authorization and planning for procedure hopefully later in the week.  She will need Lovenox held the evening before the procedure and the day of the procedure.  Judie Grieve Lazaria Schaben PA-C 09/13/2019 9:37 AM

## 2019-09-13 NOTE — ED Notes (Signed)
Pt declining to sit up at this time due to pain. Pt states that at [facility] her medications are not given in a timely manner thus exacerbating pain. Dr.Rancour notified of pt still experiencing pain after medications.

## 2019-09-13 NOTE — Plan of Care (Signed)

## 2019-09-13 NOTE — Progress Notes (Signed)
   Follow Up Note  HPI: 86 year old female with past medical history of previous compression fracture status post kyphoplasty, chronic diastolic heart failure and hypertension who was seen on 4/23 in the ED for severe midline back pain after working several days in her yard, but no findings on x-rays.  Patient discharged to a skilled nursing facility for physical therapy, but was unable to participate due to worsening pain.  Brought back into the emergency room on the night of 5/1 Pt admitted earlier this morning.  Seen after arrived to floor.    Pain a little bit better controlled with pain medication, as long as she does not move.  Has not had a bowel movement now for over a week.  Exam: CV: Regular rate and rhythm, S1-S2 Lungs: Clear to auscultation bilaterally Abd: Soft, nontender, nondistended, hypoactive bowel sounds Ext: No clubbing or cyanosis or edema  Principal Problem:   Intractable back pain/T9 compression fracture: Consulted IR.  Physician will follow up in the morning for possible kyphoplasty.  Will also need insurance approval.  So likely earliest this will be done will be middle of the week.  We will stop Lovenox the night before her procedure.  Have added lidocaine patch in the meantime and continuing short-term OxyIR, but have stopped every 12 hours extended release OxyContin (concerned about high doses of opiates in opioid relatively nave patient). Active Problems:   Essential hypertension: Blood pressure little high secondary to pain.  Continue home medications.    GERD without esophagitis: Continue PPI.    Chronic diastolic CHF (congestive heart failure) ([place]): Looks to be slightly dry.  Will check BNP.    Overweight (BMI 25.0-29.9): Meets criteria for BMI greater than 25.    Constipation: States her last bowel movement was over a week ago.  Normally is on psyllium or Senokot.  We will try dose of MiraLAX here at this does not work, try something  stronger   Disposition: Hopefully approved for kyphoplasty and can discharge after.  May need to go back to short-term skilled nursing depending on PT eval

## 2019-09-14 ENCOUNTER — Inpatient Hospital Stay: Payer: Medicare Other | Admitting: Family

## 2019-09-14 ENCOUNTER — Inpatient Hospital Stay: Payer: Medicare Other

## 2019-09-14 ENCOUNTER — Inpatient Hospital Stay (HOSPITAL_COMMUNITY): Payer: Medicare Other

## 2019-09-14 DIAGNOSIS — E663 Overweight: Secondary | ICD-10-CM

## 2019-09-14 DIAGNOSIS — I5032 Chronic diastolic (congestive) heart failure: Secondary | ICD-10-CM

## 2019-09-14 LAB — COMPREHENSIVE METABOLIC PANEL
ALT: 19 U/L (ref 0–44)
AST: 28 U/L (ref 15–41)
Albumin: 3.9 g/dL (ref 3.5–5.0)
Alkaline Phosphatase: 67 U/L (ref 38–126)
Anion gap: 8 (ref 5–15)
BUN: 9 mg/dL (ref 8–23)
CO2: 29 mmol/L (ref 22–32)
Calcium: 9.4 mg/dL (ref 8.9–10.3)
Chloride: 96 mmol/L — ABNORMAL LOW (ref 98–111)
Creatinine, Ser: 0.64 mg/dL (ref 0.44–1.00)
GFR calc Af Amer: >60 mL/min (ref >60)
GFR calc non Af Amer: >60 mL/min (ref >60)
Glucose, Bld: 106 mg/dL — ABNORMAL HIGH (ref 70–99)
Potassium: 4.1 mmol/L (ref 3.5–5.1)
Sodium: 133 mmol/L — ABNORMAL LOW (ref 135–145)
Total Bilirubin: 1.7 mg/dL — ABNORMAL HIGH (ref 0.3–1.2)
Total Protein: 6.6 g/dL (ref 6.5–8.1)

## 2019-09-14 LAB — CBC WITH DIFFERENTIAL/PLATELET
Abs Immature Granulocytes: 0.02 10*3/uL (ref 0.00–0.07)
Basophils Absolute: 0.1 10*3/uL (ref 0.0–0.1)
Basophils Relative: 1 %
Eosinophils Absolute: 0.2 10*3/uL (ref 0.0–0.5)
Eosinophils Relative: 4 %
HCT: 48.1 % — ABNORMAL HIGH (ref 36.0–46.0)
Hemoglobin: 14.7 g/dL (ref 12.0–15.0)
Immature Granulocytes: 0 %
Lymphocytes Relative: 17 %
Lymphs Abs: 0.9 10*3/uL (ref 0.7–4.0)
MCH: 26.6 pg (ref 26.0–34.0)
MCHC: 30.6 g/dL (ref 30.0–36.0)
MCV: 87.1 fL (ref 80.0–100.0)
Monocytes Absolute: 0.6 10*3/uL (ref 0.1–1.0)
Monocytes Relative: 12 %
Neutro Abs: 3.5 10*3/uL (ref 1.7–7.7)
Neutrophils Relative %: 66 %
Platelets: 522 10*3/uL — ABNORMAL HIGH (ref 150–400)
RBC: 5.52 MIL/uL — ABNORMAL HIGH (ref 3.87–5.11)
RDW: 23.9 % — ABNORMAL HIGH (ref 11.5–15.5)
WBC: 5.3 10*3/uL (ref 4.0–10.5)
nRBC: 0 % (ref 0.0–0.2)

## 2019-09-14 LAB — MAGNESIUM: Magnesium: 2.1 mg/dL (ref 1.7–2.4)

## 2019-09-14 LAB — PROTIME-INR
INR: 1 (ref 0.8–1.2)
Prothrombin Time: 12.9 seconds (ref 11.4–15.2)

## 2019-09-14 MED ORDER — SENNA 8.6 MG PO TABS
1.0000 | ORAL_TABLET | Freq: Every day | ORAL | Status: DC
  Administered 2019-09-14 – 2019-09-17 (×4): 8.6 mg via ORAL
  Filled 2019-09-14 (×4): qty 1

## 2019-09-14 MED ORDER — ENOXAPARIN SODIUM 40 MG/0.4ML ~~LOC~~ SOLN
40.0000 mg | SUBCUTANEOUS | Status: DC
  Administered 2019-09-16 – 2019-09-17 (×2): 40 mg via SUBCUTANEOUS
  Filled 2019-09-14 (×2): qty 0.4

## 2019-09-14 MED ORDER — POLYETHYLENE GLYCOL 3350 17 G PO PACK
17.0000 g | PACK | Freq: Two times a day (BID) | ORAL | Status: DC
  Administered 2019-09-14 – 2019-09-17 (×5): 17 g via ORAL
  Filled 2019-09-14 (×6): qty 1

## 2019-09-14 MED ORDER — LACTULOSE 10 GM/15ML PO SOLN
10.0000 g | Freq: Once | ORAL | Status: AC
  Administered 2019-09-14: 10 g via ORAL
  Filled 2019-09-14: qty 15

## 2019-09-14 NOTE — Progress Notes (Signed)
PROGRESS NOTE  [name] [id number] DOB: 01/21/1930 DOA: 09/12/2019 PCP: Colon Branch, MD   LOS: 1 day   Brief narrative: As per HPI,  86 year old female with past medical history of coronary artery disease, depression, anxiety, gastroesophageal reflux disease, hyperlipidemia, hypertension, iron deficiency anemia and polycythemia vera who presented to the [facility] with complaints of severe back pain after working several days in her yard.  Patient initially presented to the ED, but no fracture noted so she was discharged to [facility] a skilled nursing facility.  She continued to have severe back pain and was unable to participate in any physical therapy so was brought back to the hospital.   CT angiogram of the chest, abdomen, pelvis revealed a new T9 compression fracture thought to be the likely cause of the patient's severe intractable pain.    Patient was then admitted to the hospital for management of intractable back pain and consideration of kyphoplasty.   Assessment/Plan:  Principal Problem:   Intractable back pain Active Problems:   Essential hypertension   GERD without esophagitis   Compression fracture of T9 vertebra (HCC)   Chronic diastolic CHF (congestive heart failure) (HCC)   Overweight (BMI 25.0-29.9)   Constipation   Intractable back pain/T9 compression fracture: Continue analgesia.  IR has been consulted for kyphoplasty.  Continue Lidoderm patch, oxycodone.  Patient was on OxyContin which has been discontinued at this time.  CT scan of the chest abdomen showed T9 compression fracture.  IR has recommended MRI for further evaluation.  Will get MRI.    Essential hypertension: Blood pressure seems to be stable.  Continue metoprolol    GERD without esophagitis: Continue PPI.    Chronic diastolic CHF.  Appears compensated at this time.  Continue metoprolol.    Constipation: continue miralax- change to bid, add senna.  Will give additional  dose of lactulose today.   VTE Prophylaxis: Lovenox subcu  Code Status: Full code  Family Communication: None at bedside  Status is: Inpatient  Remains inpatient appropriate because:Ongoing active pain requiring inpatient pain management and Awaiting for kyphoplasty   Dispo: The patient is from: Skilled nursing facility              Anticipated d/c is to: SNF              Anticipated d/c date is 2 to 3 days              Patient currently is not medically stable to d/c.   Consultants:  Interventional radiology  Procedures:  None  Antibiotics:  . None  Anti-infectives (From admission, onward)   None     Subjective: Today, patient was seen and examined at bedside.  Complains of constipation and back pain especially on movement of the back  Objective: Vitals:   09/14/19 0546 09/14/19 0959  BP: 117/64 124/61  Pulse: 60 62  Resp: 15 16  Temp: 98 F (36.7 C) 97.8 F (36.6 C)  SpO2: 97% 96%    Intake/Output Summary (Last 24 hours) at 09/14/2019 1426 Last data filed at 09/14/2019 1411 Gross per 24 hour  Intake 300 ml  Output 1700 ml  Net -1400 ml   Filed Weights   09/13/19 0332  Weight: 72.6 kg   Body mass index is 25.82 kg/m.   Physical Exam: GENERAL: Patient is alert awake and oriented. Not in obvious distress. HENT: No scleral pallor or icterus. Pupils equally reactive to light. Oral mucosa is moist NECK:  is supple, no gross swelling noted. CHEST: Clear to auscultation. No crackles or wheezes.  Diminished breath sounds bilaterally.  Tenderness of the back- thoracic  spine on palpation. CVS: S1 and S2 heard, no murmur. Regular rate and rhythm.  ABDOMEN: Soft, non-tender, bowel sounds are present.   EXTREMITIES: No edema. CNS: Cranial nerves are intact. No focal motor deficits. SKIN: warm and dry without rashes.  Data Review: I have personally reviewed the following laboratory data and studies,  CBC: Recent Labs  Lab 09/12/19 2359 09/14/19 0424    WBC 6.6 5.3  NEUTROABS 5.7 3.5  HGB 14.6 14.7  HCT 47.4* 48.1*  MCV 86.0 87.1  PLT 492* [id number]*   Basic Metabolic Panel: Recent Labs  Lab 09/12/19 2359 09/14/19 0424  NA 132* 133*  K 3.9 4.1  CL 98 96*  CO2 26 29  GLUCOSE 139* 106*  BUN 12 9  CREATININE 0.59 0.64  CALCIUM 9.3 9.4  MG  --  2.1   Liver Function Tests: Recent Labs  Lab 09/14/19 0424  AST 28  ALT 19  ALKPHOS 67  BILITOT 1.7*  PROT 6.6  ALBUMIN 3.9   No results for input(s): LIPASE, AMYLASE in the last 168 hours. No results for input(s): AMMONIA in the last 168 hours. Cardiac Enzymes: No results for input(s): CKTOTAL, CKMB, CKMBINDEX, TROPONINI in the last 168 hours. BNP (last 3 results) No results for input(s): BNP in the last 8760 hours.  ProBNP (last 3 results) No results for input(s): PROBNP in the last 8760 hours.  CBG: No results for input(s): GLUCAP in the last 168 hours. Recent Results (from the past 240 hour(s))  Respiratory Panel by RT PCR (Flu A&B, Covid) - Nasopharyngeal Swab     Status: None   Collection Time: 09/06/19 11:58 AM   Specimen: Nasopharyngeal Swab  Result Value Ref Range Status   SARS Coronavirus 2 by RT PCR NEGATIVE NEGATIVE Final    Comment: (NOTE) SARS-CoV-2 target nucleic acids are NOT DETECTED. The SARS-CoV-2 RNA is generally detectable in upper respiratoy specimens during the acute phase of infection. The lowest concentration of SARS-CoV-2 viral copies this assay can detect is 131 copies/mL. A negative result does not preclude SARS-Cov-2 infection and should not be used as the sole basis for treatment or other patient management decisions. A negative result may occur with  improper specimen collection/handling, submission of specimen other than nasopharyngeal swab, presence of viral mutation(s) within the areas targeted by this assay, and inadequate number of viral copies (<131 copies/mL). A negative result must be combined with clinical observations, patient  history, and epidemiological information. The expected result is Negative. Fact Sheet for Patients:  PinkCheek.be Fact Sheet for Healthcare Providers:  GravelBags.it This test is not yet ap proved or cleared by the [place] FDA and  has been authorized for detection and/or diagnosis of SARS-CoV-2 by FDA under an Emergency Use Authorization (EUA). This EUA will remain  in effect (meaning this test can be used) for the duration of the COVID-19 declaration under Section 564(b)(1) of the Act, 21 U.S.C. section 360bbb-3(b)(1), unless the authorization is terminated or revoked sooner.    Influenza A by PCR NEGATIVE NEGATIVE Final   Influenza B by PCR NEGATIVE NEGATIVE Final    Comment: (NOTE) The Xpert Xpress SARS-CoV-2/FLU/RSV assay is intended as an aid in  the diagnosis of influenza from Nasopharyngeal swab specimens and  should not be used as a sole basis for treatment. Nasal washings and  aspirates are unacceptable for  Xpert Xpress SARS-CoV-2/FLU/RSV  testing. Fact Sheet for Patients: PinkCheek.be Fact Sheet for Healthcare Providers: GravelBags.it This test is not yet approved or cleared by the [place] FDA and  has been authorized for detection and/or diagnosis of SARS-CoV-2 by  FDA under an Emergency Use Authorization (EUA). This EUA will remain  in effect (meaning this test can be used) for the duration of the  Covid-19 declaration under Section 564(b)(1) of the Act, 21  U.S.C. section 360bbb-3(b)(1), unless the authorization is  terminated or revoked. Performed at [facility], [place] [address], [place], [place] [address]   Respiratory Panel by RT PCR (Flu A&B, Covid) - Nasopharyngeal Swab     Status: None   Collection Time: 09/13/19  5:21 AM   Specimen: Nasopharyngeal Swab  Result Value Ref Range Status   SARS Coronavirus 2 by RT PCR  NEGATIVE NEGATIVE Final    Comment: (NOTE) SARS-CoV-2 target nucleic acids are NOT DETECTED. The SARS-CoV-2 RNA is generally detectable in upper respiratoy specimens during the acute phase of infection. The lowest concentration of SARS-CoV-2 viral copies this assay can detect is 131 copies/mL. A negative result does not preclude SARS-Cov-2 infection and should not be used as the sole basis for treatment or other patient management decisions. A negative result may occur with  improper specimen collection/handling, submission of specimen other than nasopharyngeal swab, presence of viral mutation(s) within the areas targeted by this assay, and inadequate number of viral copies (<131 copies/mL). A negative result must be combined with clinical observations, patient history, and epidemiological information. The expected result is Negative. Fact Sheet for Patients:  PinkCheek.be Fact Sheet for Healthcare Providers:  GravelBags.it This test is not yet ap proved or cleared by the [place] FDA and  has been authorized for detection and/or diagnosis of SARS-CoV-2 by FDA under an Emergency Use Authorization (EUA). This EUA will remain  in effect (meaning this test can be used) for the duration of the COVID-19 declaration under Section 564(b)(1) of the Act, 21 U.S.C. section 360bbb-3(b)(1), unless the authorization is terminated or revoked sooner.    Influenza A by PCR NEGATIVE NEGATIVE Final   Influenza B by PCR NEGATIVE NEGATIVE Final    Comment: (NOTE) The Xpert Xpress SARS-CoV-2/FLU/RSV assay is intended as an aid in  the diagnosis of influenza from Nasopharyngeal swab specimens and  should not be used as a sole basis for treatment. Nasal washings and  aspirates are unacceptable for Xpert Xpress SARS-CoV-2/FLU/RSV  testing. Fact Sheet for Patients: PinkCheek.be Fact Sheet for Healthcare  Providers: GravelBags.it This test is not yet approved or cleared by the [place] FDA and  has been authorized for detection and/or diagnosis of SARS-CoV-2 by  FDA under an Emergency Use Authorization (EUA). This EUA will remain  in effect (meaning this test can be used) for the duration of the  Covid-19 declaration under Section 564(b)(1) of the Act, 21  U.S.C. section 360bbb-3(b)(1), unless the authorization is  terminated or revoked. Performed at [facility], [place] [address] [place],  [address]      Studies: CT Angio Chest/Abd/Pel for Dissection W and/or Wo Contrast  Result Date: 09/13/2019 CLINICAL DATA:  Left flank pain. Low back pain. Concern for acute aortic syndrome. EXAM: CT ANGIOGRAPHY CHEST, ABDOMEN AND PELVIS TECHNIQUE: Non-contrast CT of the chest was initially obtained. Multidetector CT imaging through the chest, abdomen and pelvis was performed using the standard protocol during bolus administration of intravenous contrast. Multiplanar reconstructed images and MIPs were  obtained and reviewed to evaluate the vascular anatomy. CONTRAST:  144mL OMNIPAQUE IOHEXOL 350 MG/ML SOLN COMPARISON:  218010 CT abdomen/pelvis. 06/24/2018 chest radiograph. 01/20/2015 chest CT angiogram. FINDINGS: CTA CHEST FINDINGS Cardiovascular: Top-normal heart size. No significant pericardial effusion/thickening. Three-vessel coronary atherosclerosis. Atherosclerotic nonaneurysmal thoracic aorta. No acute intramural hematoma, dissection, pseudoaneurysm or penetrating atherosclerotic ulcer in the thoracic aorta. Aortic arch branch vessels are patent. Normal caliber pulmonary arteries. No central pulmonary emboli. Mediastinum/Nodes: No discrete thyroid nodules. Unremarkable esophagus. No pathologically enlarged axillary, mediastinal or hilar lymph nodes. Coarsely calcified granulomatous subcarinal and right hilar nodes. Lungs/Pleura: No pneumothorax. No  pleural effusion. Mild compressive atelectasis in the medial lower lobes from the hiatal hernia. No acute consolidative airspace disease, lung masses or significant pulmonary nodules. Musculoskeletal: No aggressive appearing focal osseous lesions. Diffuse osteopenia. Mild to moderate T9 vertebral compression fracture, new since 01/20/2015 CT. Moderate thoracic spondylosis. Review of the MIP images confirms the above findings. CTA ABDOMEN AND PELVIS FINDINGS VASCULAR Aorta: Atherosclerotic nonaneurysmal abdominal aorta with no abdominal aortic dissection. Celiac: Moderate (approximately 50-60%) celiac atherosclerotic stenosis at the origin. SMA: Patent without evidence of aneurysm, dissection, vasculitis or significant stenosis. Renals: Both renal arteries are patent without evidence of aneurysm, dissection, vasculitis, fibromuscular dysplasia or significant stenosis. IMA: Probable moderate ostial stenosis by atherosclerotic plaque. Inflow: Patent without evidence of aneurysm, dissection, vasculitis or significant stenosis. Veins: No obvious venous abnormality within the limitations of this arterial phase study. Review of the MIP images confirms the above findings. NON-VASCULAR Hepatobiliary: Normal liver size. Scattered tiny liver granulomatous calcifications. No liver masses. Cholecystectomy. Bile ducts are stable and within normal post cholecystectomy limits with CBD diameter 9 mm. Pancreas: Normal, with no mass or duct dilation. Spleen: Normal size spleen. Scattered granulomatous splenic calcifications. No splenic masses. Adrenals/Urinary Tract: Normal adrenals. No renal stones. No hydronephrosis. No contour deforming renal masses. Normal bladder. Stomach/Bowel: Moderate hiatal hernia. Otherwise normal nondistended stomach. Normal caliber small bowel with no small bowel wall thickening. Appendectomy. Mild sigmoid diverticulosis with no large bowel wall thickening or significant pericolonic fat stranding.  Vascular/Lymphatic: No pathologically enlarged lymph nodes in the abdomen or pelvis. Reproductive: Stable coarsely calcified 1.3 cm left uterine fibroid. No adnexal masses. Other: No pneumoperitoneum, ascites or focal fluid collection. Musculoskeletal: No aggressive appearing focal osseous lesions. Chronic moderate L2 vertebral compression fracture status post vertebroplasty. Chronic moderate L3 and L4 vertebral compression fractures are unchanged. Diffuse osteopenia. Moderate lumbar spondylosis. Review of the MIP images confirms the above findings. IMPRESSION: 1. No acute aortic syndrome. 2. Mild to moderate T9 vertebral compression fracture, new since [id number] CT, of uncertain chronicity, possibly acute. Chronic L2, L3 and L4 vertebral compression fractures. 3. Three-vessel coronary atherosclerosis. 4. Moderate hiatal hernia. 5. Mild sigmoid diverticulosis. 6. Aortic Atherosclerosis (ICD10-I70.0). Additional chronic findings as detailed. Electronically Signed   By: Ilona Sorrel M.D.   On: 09/13/2019 05:03     Flora Lipps, MD  [facility] 09/14/2019

## 2019-09-14 NOTE — TOC Initial Note (Signed)
Transition of Care [facility]) - Initial/Assessment Note    Patient Details  Name: [name] MRN: [id number] Date of Birth: February 05, 1930  [facility] of [facility]) CM/SW Contact:    Lennart Pall, LCSW Phone Number: 09/14/2019, 11:15 AM  Clinical Narrative:    Met with pt to review potential d/c needs.  Pt admitted from [facility] and has expressed that she was not happy with care there and would want to change facilities.    Pt reports that she is awaiting IR consult/ input and hopeful she will be able to have kyphoplasty for T9 fx.  She notes that she has had a prior kyphoplasty and had a very good recovery.  Feels that, if she can have the procedure, then she may be able to d/c home.  She reports that, if she is unable to have procedure or her recovery is limited, she would prefer SNF at either [facility] or [facility] where she has been in the past.    Await IR consult and further plans.               Expected Discharge Plan: [facility](vs SNF) Barriers to Discharge: Continued Medical Work up   Patient Goals and CMS Choice Patient states their goals for this hospitalization and ongoing recovery are:: hopeful to have kyphoplasty and be able to mobilize      Expected Discharge Plan and Services Expected Discharge Plan: [place] [facility](vs SNF) In-house Referral: Clinical Social Work     Living arrangements for the past 2 months: [place]                                      Prior Living Arrangements/Services Living arrangements for the past 2 months: [place]   Patient language and need for interpreter reviewed:: No        Need for Family Participation in Patient Care: Yes (Comment) Care giver support system in place?: No (comment)   Criminal Activity/Legal Involvement Pertinent to Current Situation/Hospitalization: No - Comment as needed  Activities of Daily Living Home Assistive  Devices/Equipment: Eyeglasses, Environmental consultant (specify type), Raised toilet seat with rails, Blood pressure cuff(from SNF/rehab) ADL Screening (condition at time of admission) Patient's cognitive ability adequate to safely complete daily activities?: Yes Is the patient deaf or have difficulty hearing?: No Does the patient have difficulty seeing, even when wearing glasses/contacts?: No Does the patient have difficulty concentrating, remembering, or making decisions?: No Patient able to express need for assistance with ADLs?: Yes Does the patient have difficulty dressing or bathing?: No Independently performs ADLs?: Yes (appropriate for developmental age) Does the patient have difficulty walking or climbing stairs?: No Weakness of Legs: None Weakness of Arms/Hands: None  Permission Sought/Granted Permission sought to share information with : Family Supports Permission granted to share information with : Yes, Verbal Permission Granted  Share Information with NAME: Andrew Au     Permission granted to share info w Relationship: grandson     Emotional Assessment Appearance:: Appears stated age Attitude/Demeanor/Rapport: Gracious Affect (typically observed): Constricted(due to pain) Orientation: : Oriented to Self, Oriented to Place, Oriented to  Time, Oriented to Situation Alcohol / Substance Use: Not Applicable Psych Involvement: No (comment)  Admission diagnosis:  Intractable back pain [M54.9] Compression fracture of T9 vertebra with routine healing, subsequent encounter [S22.070D] Compression fracture of T9 vertebra ([place]) [S22.070A] Patient Active  Problem List   Diagnosis Date Noted  . Intractable back pain 09/13/2019  . Compression fracture of T9 vertebra ([place]) 09/13/2019  . Chronic diastolic CHF (congestive heart failure) ([place]) 09/13/2019  . Overweight (BMI 25.0-29.9) 09/13/2019  . Constipation 09/13/2019  . Raynaud's phenomenon without gangrene 05/19/2018  . Iron deficiency anemia  due to chronic blood loss 12/30/2017  . Insomnia 06/25/2017  . Mild CAD 05/24/2017  . PCP NOTES >>> 02/15/2015  . Superficial thrombophlebitis 04/06/2014  . Annual physical exam 08/18/2012  . Edema 04/14/2012  . Breast cancer ([place]) 11/05/2011  . Polycythemia 11/05/2011  . TIA (transient ischemic attack) 01/16/2011  . CARDIOMYOPATHY 08/03/2009  . DJD -- pain mgmt 08/16/2008  . ABDOMINAL PAIN, LEFT LOWER QUADRANT 07/05/2008  . GAIT DISTURBANCE 10/21/2007  . ANXIETY DEPRESSION 10/08/2007  . Hyperlipidemia 06/25/2007  . Takotsubo cardiomyopathy 06/25/2007  . GERD without esophagitis 06/25/2007  . Essential hypertension 09/30/2006  . Osteoporosis 09/30/2006  . Migraine with aura 09/30/2006   PCP:  Colon Branch, MD Pharmacy:   [facility] [phone] [address], [address] AT [facility] [address] [place] [address] Phone: [phone] Fax: [phone]     Social Determinants of Health ([place]) Interventions    Readmission Risk Interventions Readmission Risk Prevention Plan 09/14/2019  Medication Screening Complete  Transportation Screening Complete  Some recent data might be hidden

## 2019-09-14 NOTE — Telephone Encounter (Signed)
Pt admitted to hosp on 09/12/2019 w/ compression fx of T9.

## 2019-09-14 NOTE — Telephone Encounter (Signed)
[name],  I am unsure how to go about doing this?

## 2019-09-15 ENCOUNTER — Inpatient Hospital Stay (HOSPITAL_COMMUNITY): Payer: Medicare Other

## 2019-09-15 HISTORY — PX: IR KYPHO THORACIC WITH BONE BIOPSY: IMG5518

## 2019-09-15 LAB — MAGNESIUM: Magnesium: 2.1 mg/dL (ref 1.7–2.4)

## 2019-09-15 LAB — COMPREHENSIVE METABOLIC PANEL
ALT: 18 U/L (ref 0–44)
AST: 24 U/L (ref 15–41)
Albumin: 3.6 g/dL (ref 3.5–5.0)
Alkaline Phosphatase: 64 U/L (ref 38–126)
Anion gap: 8 (ref 5–15)
BUN: 12 mg/dL (ref 8–23)
CO2: 29 mmol/L (ref 22–32)
Calcium: 9.3 mg/dL (ref 8.9–10.3)
Chloride: 96 mmol/L — ABNORMAL LOW (ref 98–111)
Creatinine, Ser: 0.49 mg/dL (ref 0.44–1.00)
GFR calc Af Amer: >60 mL/min (ref >60)
GFR calc non Af Amer: >60 mL/min (ref >60)
Glucose, Bld: 101 mg/dL — ABNORMAL HIGH (ref 70–99)
Potassium: 4.2 mmol/L (ref 3.5–5.1)
Sodium: 133 mmol/L — ABNORMAL LOW (ref 135–145)
Total Bilirubin: 1.4 mg/dL — ABNORMAL HIGH (ref 0.3–1.2)
Total Protein: 6.4 g/dL — ABNORMAL LOW (ref 6.5–8.1)

## 2019-09-15 LAB — CBC
HCT: 47.9 % — ABNORMAL HIGH (ref 36.0–46.0)
Hemoglobin: 14.6 g/dL (ref 12.0–15.0)
MCH: 27.2 pg (ref 26.0–34.0)
MCHC: 30.5 g/dL (ref 30.0–36.0)
MCV: 89.2 fL (ref 80.0–100.0)
Platelets: 508 10*3/uL — ABNORMAL HIGH (ref 150–400)
RBC: 5.37 MIL/uL — ABNORMAL HIGH (ref 3.87–5.11)
RDW: 23.9 % — ABNORMAL HIGH (ref 11.5–15.5)
WBC: 6.9 10*3/uL (ref 4.0–10.5)
nRBC: 0 % (ref 0.0–0.2)

## 2019-09-15 IMAGING — XA IR KYPHO VERTEBRAL THORACIC AUGMENTATION
2 series · 2 of 2 positions shown · non-contrast
Comparison: Thoracic spine MRI 09/14/2019

CLINICAL DATA: 89-year-old female with acutely symptomatic T9
vertebral compression fracture. She presents for cement augmentation
with kyphoplasty.

EXAM:
FLUOROSCOPIC GUIDED KYPHOPLASTY OF THE T9 VERTEBRAL BODY
TECHNIQUE: The procedure, risks (including but not limited to bleeding,
infection, organ damage), benefits, and alternatives were explained
to the patient. Questions regarding the procedure were encouraged
and answered. The patient understands and consents to the procedure.

[Series 21: kp/vp · 1 of 1 slices shown (1 of 2)]
[im 1/1]
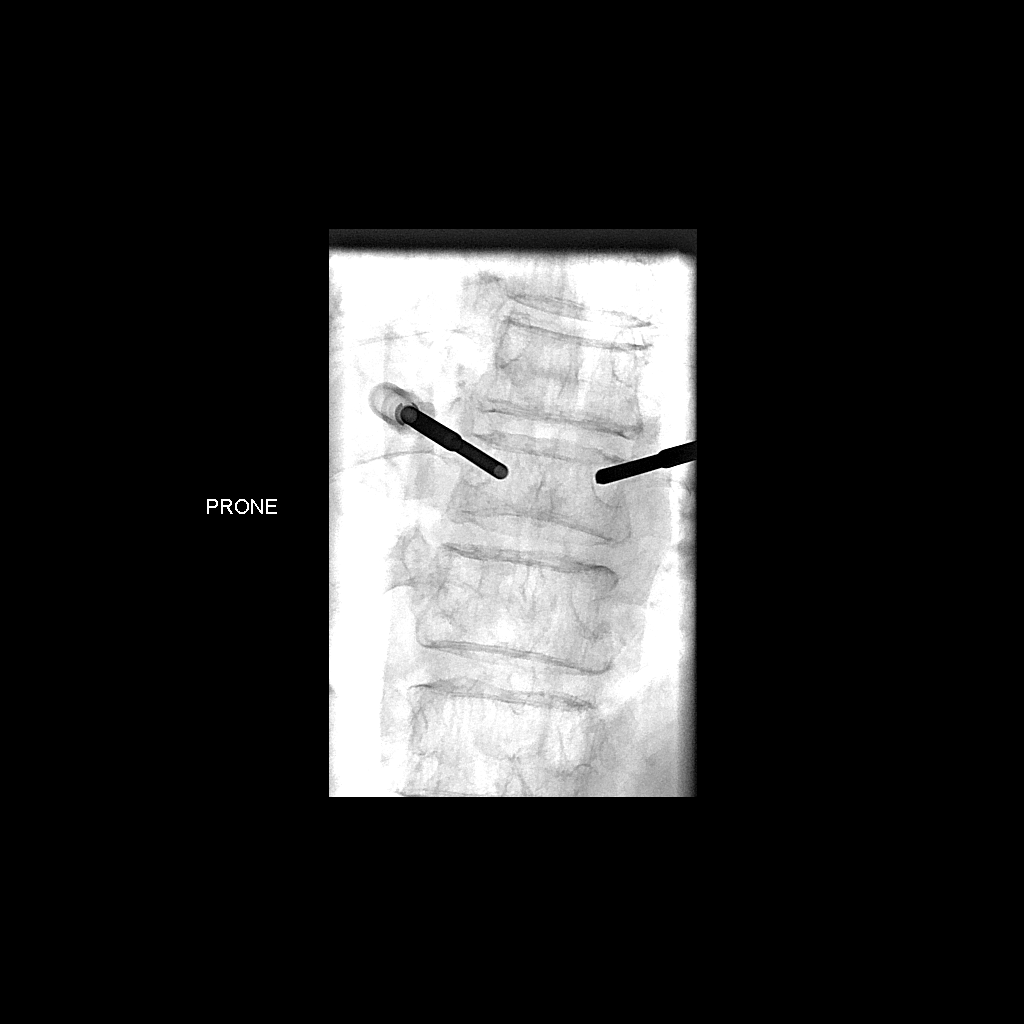

[Series 28: kp/vp · 1 of 1 slices shown (2 of 2)]
[im 1/1]
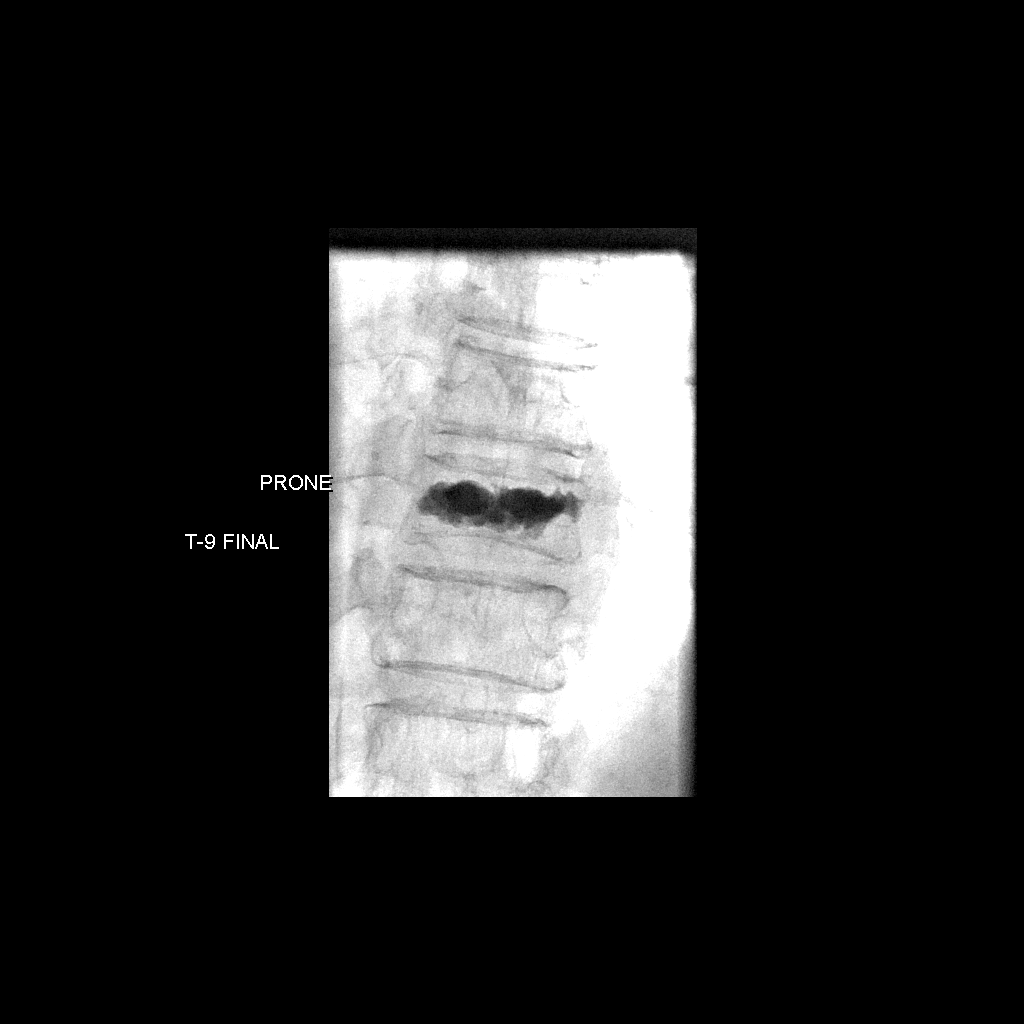

[2 of 2 positions shown; findings below may reference images not displayed]

MEDICATIONS:
As antibiotic prophylaxis, 900 mg clindamycin was ordered
pre-procedure and administered intravenously within 1 hour of
incision.

ANESTHESIA/SEDATION:
Moderate (conscious) sedation was employed during this procedure. A
total of Versed 4 mg and Fentanyl 100 mcg was administered
intravenously.

Moderate Sedation Time: 41 minutes. The patient's level of
consciousness and vital signs were monitored continuously by
radiology nursing throughout the procedure under my direct
supervision.

FLUOROSCOPY TIME:  8 minutes, 54 seconds (107 mGy)

COMPLICATIONS:
None immediate.
The patient was placed prone on the fluoroscopic table. The skin
overlying the upper thoracic region was then prepped and draped in
the usual sterile fashion. Maximal barrier sterile technique was
utilized including caps, mask, sterile gowns, sterile gloves,
sterile drape, hand hygiene and skin antiseptic. Intravenous
Fentanyl and Versed were administered as conscious sedation during
continuous cardiorespiratory monitoring by the radiology RN.

The left pedicle at T9 was then infiltrated with 1% lidocaine
followed by the advancement of a Kyphon trocar needle through the
left pedicle into the posterior one-third of the vertebral body.
Subsequently, the osteo drill was advanced to the anterior third of
the vertebral body. The osteo drill was retracted. Through the
working cannula, a Kyphon inflatable bone tamp 15 x 2 was advanced
and positioned with the distal marker approximately 5 mm from the
anterior aspect of the cortex. Appropriate positioning was confirmed
on the AP projection. At this time, the balloon was expanded using
contrast via a Kyphon inflation syringe device via micro tubing.

In similar fashion, the right T9 pedicle was infiltrated with 1%
lidocaine followed by the advancement of a second Kyphon trocar
needle through the right pedicle into the posterior third of the
vertebral body. Subsequently, the osteo drill was coaxially advanced
to the anterior right third. The osteo drill was exchanged for a
Kyphon inflatable bone tamp 15 x 2, advanced to the 5 mm of the
anterior aspect of the cortex. The balloon was then expanded using
contrast as above.

Inflations were continued until there was near apposition with the
superior end plate. At this time, methylmethacrylate mixture was
reconstituted in the Kyphon bone mixing device system. This was then
loaded into the delivery mechanism, attached to Kyphon bone fillers.

The balloons were deflated and removed followed by the instillation
of methylmethacrylate mixture with excellent filling in the AP and
lateral projections. No extravasation was noted in the disk spaces
or posteriorly into the spinal canal. No epidural venous
contamination was seen.

The working cannulae and the bone filler were then retrieved and
removed. Hemostasis was achieved with manual compression. The
patient tolerated the procedure well without immediate
postprocedural complication.
IMPRESSION: 1. Technically successful T9 vertebral body augmentation using
balloon kyphoplasty.
2. Per CMS PQRS reporting requirements (PQRS Measure 24): Given the
patient's age of greater than 50 and the fracture site (hip, distal
radius, or spine), the patient should be tested for osteoporosis
using DXA, and the appropriate treatment considered based on the DXA
results.

## 2019-09-15 MED ORDER — IOHEXOL 300 MG/ML  SOLN
50.0000 mL | Freq: Once | INTRAMUSCULAR | Status: AC | PRN
  Administered 2019-09-15: 10 mL

## 2019-09-15 MED ORDER — BISACODYL 10 MG RE SUPP: 10.0000 mg | Freq: Every day | RECTAL | Status: DC | PRN

## 2019-09-15 MED ORDER — MIDAZOLAM HCL 2 MG/2ML IJ SOLN
INTRAMUSCULAR | Status: AC
  Filled 2019-09-15: qty 4

## 2019-09-15 MED ORDER — CLINDAMYCIN PHOSPHATE 600 MG/50ML IV SOLN
600.0000 mg | Freq: Once | INTRAVENOUS | Status: DC
  Filled 2019-09-15: qty 50

## 2019-09-15 MED ORDER — LIDOCAINE HCL (PF) 1 % IJ SOLN
INTRAMUSCULAR | Status: AC
  Filled 2019-09-15: qty 30

## 2019-09-15 MED ORDER — FENTANYL CITRATE (PF) 100 MCG/2ML IJ SOLN
INTRAMUSCULAR | Status: AC
  Filled 2019-09-15: qty 2

## 2019-09-15 MED ORDER — MIDAZOLAM HCL 2 MG/2ML IJ SOLN
INTRAMUSCULAR | Status: AC | PRN
  Administered 2019-09-15 (×4): 1 mg via INTRAVENOUS

## 2019-09-15 MED ORDER — CLINDAMYCIN PHOSPHATE 900 MG/50ML IV SOLN: 900.0000 mg | Freq: Once | INTRAVENOUS | Status: AC

## 2019-09-15 MED ORDER — CLINDAMYCIN PHOSPHATE 900 MG/50ML IV SOLN
INTRAVENOUS | Status: AC
  Administered 2019-09-15: 900 mg via INTRAVENOUS
  Filled 2019-09-15: qty 50

## 2019-09-15 MED ORDER — FENTANYL CITRATE (PF) 100 MCG/2ML IJ SOLN
INTRAMUSCULAR | Status: AC | PRN
  Administered 2019-09-15 (×2): 50 ug via INTRAVENOUS

## 2019-09-15 MED ORDER — LIDOCAINE HCL (PF) 1 % IJ SOLN
INTRAMUSCULAR | Status: AC | PRN
  Administered 2019-09-15 (×2): 10 mL

## 2019-09-15 NOTE — Plan of Care (Signed)
Plan of care reviewed and discussed with the patient. 

## 2019-09-15 NOTE — Progress Notes (Addendum)
Referring Physician(s): [name],L  Supervising Physician: Jacqulynn Cadet  Patient Status:  [facility] - In-pt  Chief Complaint:  Back pain, T9 compression fracture  Subjective: Patient familiar to IR service from prior L2 vertebroplasty in 2009.  She has a past medical history significant for coronary artery disease, prior TIA, CHF, prior DVT,  anxiety/depression, GERD, hyperlipidemia, hypertension, iron deficiency anemia, and polycythemia vera. She was admitted to [facility] on 09/12/19 secondary to persistent mid back pain after working in her yard for several days.  Patient was seen in the ED on 09/04/2019 with plain films revealing no acute fracture.  She was subsequently discharged back to her SNF.  Follow-up MRI of the thoracic spine performed on 5/3 revealed an acute T9 compression fracture with 30% height loss.  Patient rates her back pain as moderate to severe with some radiation to the anterior chest.  Pain is exacerbated with any movement.  She does not report any associated paresthesias.  Request now received from primary care team for consideration of vertebral body augmentation.  She currently denies fever, headache, dyspnea, cough, abdominal pain, or abnormal bleeding.  She does have some occasional nausea.  She is currently on oxycodone, morphine, and Lidoderm patch but pain persists.   Past Medical History:  Diagnosis Date  . CAD (coronary artery disease)    mild nonobstructive disease on cath in 2003  . Cancer ([place])   . Cardiomyopathy    Probable Takotsubo, severe CP w/ normal cath in 1994. Severe CP in 2003 w/ widespread T wave inversions on ECG. Cath w/ minimal coronary disease but LV-gram showed periapical severe hypokinesis and basilar hyperkinesia (EF 40%). Last echo in 4/09 confirmed full LV functional recovery with EF 60%, no regional wall motion abnormalities, mild to moderate LVH.  Marland Kitchen Chronic diastolic CHF (congestive heart failure) ([place]) 09/13/2019  . CVA  (cerebral infarction)   . Depression with anxiety 03-29-11   lost husband 3'09  . Diverticulitis   . DVT (deep venous thrombosis) ([place])    after venous ablation, R leg  . E. coli gastroenteritis 03-29-11   8'10  . Fracture 09/10/07   L2, status post vertebroplasty of L2 performed by IR  . GERD (gastroesophageal reflux disease)   . Headache(784.0)    migraines  . Hemorrhoids   . Hiatal hernia 03-29-11   no nerve problems  . Hyperlipidemia   . Hyperplastic colon polyp 06/2001  . Hypertension   . Iron deficiency anemia due to chronic blood loss 12/30/2017  . OA (osteoarthritis) of knee 03-29-11   w/ bilateral knee pain-not a problem now  . OSA (obstructive sleep apnea) 03-29-11   no cpap used, not a problem now.  . Osteoporosis   . Otalgia of both ears    Dr. Simeon Craft  . Polycythemia   . Stroke [facility]) 03-29-11   CVA x2 -last 10'12-?TIA(visual problems)  . Varicose vein    Past Surgical History:  Procedure Laterality Date  . APPENDECTOMY    . BACK SURGERY    . BREAST BIOPSY    . BREAST CYST EXCISION    . BREAST LUMPECTOMY Left 2013   left stage I left breast cancer  . CHOLECYSTECTOMY  04/09/2011   Procedure: LAPAROSCOPIC CHOLECYSTECTOMY WITH INTRAOPERATIVE CHOLANGIOGRAM;  Surgeon: Odis Hollingshead, MD;  Location: [facility] ORS;  Service: General;  Laterality: N/A;  . Dental Extraction     L maxillary molar  . KYPHOSIS SURGERY  08/2007   cement used  . OVARIAN CYST SURGERY  left  . TONSILLECTOMY    . TOTAL KNEE ARTHROPLASTY  06/2010   left  . TUBAL LIGATION        Allergies: Penicillins, Valsartan, Atorvastatin, Carvedilol, Ezetimibe, Fluvastatin sodium, Magnesium hydroxide, Meloxicam, Omeprazole magnesium, Pneumovax [pneumococcal polysaccharide vaccine], Quinapril hcl, Simvastatin, Topamax [topiramate], and Vit d-vit e-safflower oil  Medications: Prior to Admission medications   Medication Sig Start Date End Date Taking? Authorizing Provider  acetaminophen (TYLENOL 8  HOUR) 650 MG CR tablet Take 1 tablet (650 mg total) by mouth every 8 (eight) hours. 09/06/19  Yes Davonna Belling, MD  aspirin 81 MG tablet Take 81 mg by mouth daily.   Yes [provider]  docusate sodium (COLACE) 100 MG capsule Take 100 mg by mouth 2 (two) times daily.   Yes [provider]  HYDROcodone-acetaminophen (NORCO) 10-325 MG tablet Take 1 tablet by mouth every 8 (eight) hours as needed for moderate pain or severe pain.   Yes [provider]  hydroxyurea (HYDREA) 500 MG capsule Take 500 mg by mouth every other day. May take with food to minimize GI side effects.   Yes [provider]  Lidocaine 4 % PTCH Apply 1 patch topically 2 (two) times daily. Patient taking differently: Apply 1 patch topically daily. Remove after 12 hours 09/06/19  Yes Davonna Belling, MD  LORazepam (ATIVAN) 0.5 MG tablet TAKE 1/2 TABLET BY MOUTH EVERY MORNING AND EVERY AFTERNOON. THEN TAKE 2 TABLETS BY MOUTH AT BEDTIME AS NEEDED Patient taking differently: Take 0.25-1 mg by mouth See admin instructions. TAKE 0.25 BY MOUTH EVERY MORNING AND EVERY AFTERNOON. THEN TAKE 1mg  BY MOUTH AT BEDTIME AS NEEDED 08/19/19  Yes Colon Branch, MD  metoprolol tartrate (LOPRESSOR) 50 MG tablet Take 1 tablet (50 mg total) by mouth 2 (two) times daily. 06/08/19  Yes Paz, Alda Berthold, MD  omeprazole (PRILOSEC) 20 MG capsule Take 20 mg by mouth daily.   Yes [provider]  polyethylene glycol (MIRALAX / GLYCOLAX) 17 g packet Take 17 g by mouth daily.   Yes [provider]  acetaminophen-codeine (TYLENOL #3) 300-30 MG tablet TAKE 1 TABLET BY MOUTH EVERY 8 HOURS AS NEEDED FOR MIGRAINE Patient not taking: Reported on 09/13/2019 09/06/19   Davonna Belling, MD  bismuth subsalicylate (PEPTO BISMOL) 262 MG/15ML suspension Take 30 mLs by mouth daily as needed for indigestion or diarrhea or loose stools.     [provider]  hydrochlorothiazide (HYDRODIURIL) 25 MG tablet Take 1 tablet (25 mg  total) by mouth daily as needed (swelling in the legs ). Patient not taking: Reported on 09/13/2019 07/05/16   Colon Branch, MD  mometasone (ELOCON) 0.1 % cream Apply 1 application topically daily. Patient not taking: Reported on 09/04/2019 03/13/18   Volanda Napoleon, MD  tizanidine (ZANAFLEX) 2 MG capsule Take 1 capsule (2 mg total) by mouth 3 (three) times daily as needed for muscle spasms. 09/06/19   Davonna Belling, MD  valACYclovir (VALTREX) 500 MG tablet Take 2 tablets at onset of rash and 2 tablets 12 hours later as needed. Patient not taking: Reported on 09/13/2019 02/16/19   [place], Holli Humbles, NP     Vital Signs: BP 135/75 (BP Location: Left Arm)   Pulse 63   Temp 97.7 F (36.5 C) (Oral)   Resp 12   Ht 5\' 6"  (1.676 m)   Wt 160 lb (72.6 kg)   SpO2 92%   BMI 25.82 kg/m   Physical Exam awake, alert.  Chest clear to auscultation  bilaterally.  Heart with regular rate and rhythm.  Abdomen soft, positive bowel sounds, nontender.  No lower extremity edema.  Moderate to severe paravertebral tenderness T9 region.  Imaging: MR THORACIC SPINE WO CONTRAST  Result Date: 09/14/2019 CLINICAL DATA:  Fall. T9 compression fracture. EXAM: MRI THORACIC SPINE WITHOUT CONTRAST TECHNIQUE: Multiplanar, multisequence MR imaging of the thoracic spine was performed. No intravenous contrast was administered. COMPARISON:  CTA chest, abdomen, and pelvis 09/13/2019 FINDINGS: Alignment:  Normal. Vertebrae: T9 compression fracture with extensive marrow edema and 30% vertebral body height loss without retropulsion. Moderate marrow edema in the opposing T6 and T7 vertebral bodies without height loss, favored to be degenerative with Modic type 2 degenerative endplate changes also present at this level. Remote L1 compression fracture status post augmentation. Nonspecific diffuse bone marrow heterogeneity. Cord:  Normal signal and morphology. Paraspinal and other soft tissues: Moderately large sliding hiatal hernia. Trace  right pleural effusion. Disc levels: Mild disc bulging primarily in the mid to upper thoracic spine and mild thoracic facet arthrosis without spinal stenosis or spinal cord mass effect. Mild right and moderate left neural foraminal stenosis at T9-10. IMPRESSION: 1. Acute T9 compression fracture with 30% height loss. 2. Mild thoracic spondylosis and facet arthrosis without spinal stenosis. Electronically Signed   By: Logan Bores M.D.   On: 09/14/2019 14:58   CT Angio Chest/Abd/Pel for Dissection W and/or Wo Contrast  Result Date: 09/13/2019 CLINICAL DATA:  Left flank pain. Low back pain. Concern for acute aortic syndrome. EXAM: CT ANGIOGRAPHY CHEST, ABDOMEN AND PELVIS TECHNIQUE: Non-contrast CT of the chest was initially obtained. Multidetector CT imaging through the chest, abdomen and pelvis was performed using the standard protocol during bolus administration of intravenous contrast. Multiplanar reconstructed images and MIPs were obtained and reviewed to evaluate the vascular anatomy. CONTRAST:  126mL OMNIPAQUE IOHEXOL 350 MG/ML SOLN COMPARISON:  218010 CT abdomen/pelvis. 06/24/2018 chest radiograph. 01/20/2015 chest CT angiogram. FINDINGS: CTA CHEST FINDINGS Cardiovascular: Top-normal heart size. No significant pericardial effusion/thickening. Three-vessel coronary atherosclerosis. Atherosclerotic nonaneurysmal thoracic aorta. No acute intramural hematoma, dissection, pseudoaneurysm or penetrating atherosclerotic ulcer in the thoracic aorta. Aortic arch branch vessels are patent. Normal caliber pulmonary arteries. No central pulmonary emboli. Mediastinum/Nodes: No discrete thyroid nodules. Unremarkable esophagus. No pathologically enlarged axillary, mediastinal or hilar lymph nodes. Coarsely calcified granulomatous subcarinal and right hilar nodes. Lungs/Pleura: No pneumothorax. No pleural effusion. Mild compressive atelectasis in the medial lower lobes from the hiatal hernia. No acute consolidative airspace  disease, lung masses or significant pulmonary nodules. Musculoskeletal: No aggressive appearing focal osseous lesions. Diffuse osteopenia. Mild to moderate T9 vertebral compression fracture, new since 01/20/2015 CT. Moderate thoracic spondylosis. Review of the MIP images confirms the above findings. CTA ABDOMEN AND PELVIS FINDINGS VASCULAR Aorta: Atherosclerotic nonaneurysmal abdominal aorta with no abdominal aortic dissection. Celiac: Moderate (approximately 50-60%) celiac atherosclerotic stenosis at the origin. SMA: Patent without evidence of aneurysm, dissection, vasculitis or significant stenosis. Renals: Both renal arteries are patent without evidence of aneurysm, dissection, vasculitis, fibromuscular dysplasia or significant stenosis. IMA: Probable moderate ostial stenosis by atherosclerotic plaque. Inflow: Patent without evidence of aneurysm, dissection, vasculitis or significant stenosis. Veins: No obvious venous abnormality within the limitations of this arterial phase study. Review of the MIP images confirms the above findings. NON-VASCULAR Hepatobiliary: Normal liver size. Scattered tiny liver granulomatous calcifications. No liver masses. Cholecystectomy. Bile ducts are stable and within normal post cholecystectomy limits with CBD diameter 9 mm. Pancreas: Normal, with no mass or duct dilation. Spleen: Normal size spleen. Scattered granulomatous  splenic calcifications. No splenic masses. Adrenals/Urinary Tract: Normal adrenals. No renal stones. No hydronephrosis. No contour deforming renal masses. Normal bladder. Stomach/Bowel: Moderate hiatal hernia. Otherwise normal nondistended stomach. Normal caliber small bowel with no small bowel wall thickening. Appendectomy. Mild sigmoid diverticulosis with no large bowel wall thickening or significant pericolonic fat stranding. Vascular/Lymphatic: No pathologically enlarged lymph nodes in the abdomen or pelvis. Reproductive: Stable coarsely calcified 1.3 cm left  uterine fibroid. No adnexal masses. Other: No pneumoperitoneum, ascites or focal fluid collection. Musculoskeletal: No aggressive appearing focal osseous lesions. Chronic moderate L2 vertebral compression fracture status post vertebroplasty. Chronic moderate L3 and L4 vertebral compression fractures are unchanged. Diffuse osteopenia. Moderate lumbar spondylosis. Review of the MIP images confirms the above findings. IMPRESSION: 1. No acute aortic syndrome. 2. Mild to moderate T9 vertebral compression fracture, new since [id number] CT, of uncertain chronicity, possibly acute. Chronic L2, L3 and L4 vertebral compression fractures. 3. Three-vessel coronary atherosclerosis. 4. Moderate hiatal hernia. 5. Mild sigmoid diverticulosis. 6. Aortic Atherosclerosis (ICD10-I70.0). Additional chronic findings as detailed. Electronically Signed   By: Ilona Sorrel M.D.   On: 09/13/2019 05:03    Labs:  CBC: Recent Labs    09/04/19 1903 09/12/19 2359 09/14/19 0424 09/15/19 0254  WBC 10.1 6.6 5.3 6.9  HGB 14.8 14.6 14.7 14.6  HCT 49.3* 47.4* 48.1* 47.9*  PLT 470* 492* 522* 508*    COAGS: Recent Labs    09/14/19 0424  INR 1.0    BMP: Recent Labs    09/04/19 1903 09/12/19 2359 09/14/19 0424 09/15/19 0254  NA 137 132* 133* 133*  K 4.2 3.9 4.1 4.2  CL 102 98 96* 96*  CO2 28 26 29 29   GLUCOSE 118* 139* 106* 101*  BUN 7* 12 9 12   CALCIUM 9.0 9.3 9.4 9.3  CREATININE 0.61 0.59 0.64 0.49  GFRNONAA >60 >60 >60 >60  GFRAA >60 >60 >60 >60    LIVER FUNCTION TESTS: Recent Labs    08/10/19 1153 09/04/19 1903 09/14/19 0424 09/15/19 0254  BILITOT 1.5* 1.4* 1.7* 1.4*  AST 15 21 28 24   ALT 8 14 19 18   ALKPHOS 67 60 67 64  PROT 7.0 6.6 6.6 6.4*  ALBUMIN 4.3 3.9 3.9 3.6    Assessment and Plan: 86 yo female with past medical history significant for coronary artery disease, prior TIA, CHF, prior DVT, anxiety/depression, GERD, hyperlipidemia, hypertension, iron deficiency anemia, and polycythemia  vera.  She is also status post L2 vertebroplasty in 2009 by our service.  She was admitted to [facility] on 09/12/19 secondary to persistent mid back pain after working in her yard for several days.  Patient was seen in the ED on 09/04/2019 with plain films revealing no acute fracture.  She was subsequently discharged back to her SNF.  Follow-up MRI of the thoracic spine performed on 5/3 revealed an acute T9 compression fracture with 30% height loss.  Patient rates her back pain as moderate to severe with some radiation to the anterior chest.  Pain is exacerbated with any movement.  She does not report any associated paresthesias.  Request now received from primary care team for consideration of vertebral body augmentation.  Latest imaging studies were reviewed by Dr. Laurence Ferrari and T9 fracture appears amenable to kyphoplasty.  Details/risks of procedure, including but not limited to, internal bleeding, infection, injury to adjacent structures discussed with patient and daughter with their understanding and consent.  We are currently awaiting approval by insurance before proceeding with case.  If insurance  approval is received today we will plan procedure for this afternoon.   Electronically Signed: D. Rowe Robert, PA-C 09/15/2019, 10:02 AM   I spent a total of 30 minutes at the the patient's bedside AND on the patient's hospital floor or unit, greater than 50% of which was counseling/coordinating care for T9 vertebral body augmentation/kyphoplasty    Patient ID: [name], female   DOB: [birth year], 86 y.o.   MRN: [id number]

## 2019-09-15 NOTE — Procedures (Signed)
Interventional Radiology Procedure Note  Procedure: T9 kyphoplasty  Complications: None  Estimated Blood Loss: None  Recommendations: - Bedrest x 3 hrs - Begin PT/OT tomorrow   Signed,  Criselda Peaches, MD

## 2019-09-15 NOTE — Progress Notes (Signed)
PROGRESS NOTE  [name] [id number] DOB: 12/21/1929 DOA: 09/12/2019 PCP: Colon Branch, MD   LOS: 2 days   Brief narrative: As per HPI,  86 year old female with past medical history of coronary artery disease, depression, anxiety, gastroesophageal reflux disease, hyperlipidemia, hypertension, iron deficiency anemia and polycythemia vera who presented to the [facility] with complaints of severe back pain after working several days in her yard.  Patient initially presented to the ED, but no fracture noted so she was discharged to [facility] a skilled nursing facility.  She continued to have severe back pain and was unable to participate in any physical therapy so was brought back to the hospital.   CT angiogram of the chest, abdomen, pelvis revealed a new T9 compression fracture thought to be the likely cause of the patient's severe intractable pain.    Patient was then admitted to the hospital for management of intractable back pain and consideration of kyphoplasty.   Assessment/Plan:  Principal Problem:   Intractable back pain Active Problems:   Essential hypertension   GERD without esophagitis   Compression fracture of T9 vertebra (HCC)   Chronic diastolic CHF (congestive heart failure) (HCC)   Overweight (BMI 25.0-29.9)   Constipation  Intractable back pain/T9 compression fracture: Continue analgesia.  IR on board for kyphoplasty.  Continue Lidoderm patch, oxycodone.  Patient was on OxyContin which has been discontinued at this time.  CT scan of the chest abdomen showed T9 compression fracture. MRI with confirmation of fracture and 30% height loss.  I had is planning for kyphoplasty pending insurance authorization.    Essential hypertension: Blood pressure seems to be stable.  Continue metoprolol    GERD without esophagitis: Continue PPI.    Chronic diastolic CHF.  Appears compensated at this time.  Continue metoprolol.    Constipation: continue  miralax- change to bid, add senna.  No bowel movements yet.  Will try magnesium citrate/suppository.  Patient prefers suppository.  Mild thrombocytosis.  Could be reactive.  Will closely monitor.  VTE Prophylaxis: Lovenox subcu  Code Status: Full code  Family Communication: I spoke with the patient's daughter on the phone and updated her about the clinical condition of the patient.  Status is: Inpatient  Remains inpatient appropriate because:Ongoing active pain requiring inpatient pain management and Awaiting for kyphoplasty  Dispo: The patient is from: Skilled nursing facility              Anticipated d/c is to: SNF              Anticipated d/c date is 1 to 2 days.  Will obtain PT evaluation after procedure              Patient currently is not medically stable to d/c.  Consultants:  Interventional radiology  Procedures:  None  Antibiotics:  . None  Anti-infectives (From admission, onward)   Start     Dose/Rate Route Frequency Ordered Stop   09/15/19 1500  clindamycin (CLEOCIN) IVPB 600 mg     600 mg 100 mL/hr over 30 Minutes Intravenous  Once 09/15/19 1023       Subjective: Today, patient was seen and examined at bedside.  Still complains of constipation and severe back pain.  Patient stated that the back pain got worse after having her MRI yesterday.  Objective: Vitals:   09/14/19 2310 09/15/19 0530  BP: (!) 157/54 135/75  Pulse: 70 63  Resp: 18 12  Temp: 98.2 F (36.8 C) 97.7 F (  36.5 C)  SpO2: 92% 92%    Intake/Output Summary (Last 24 hours) at 09/15/2019 1111 Last data filed at 09/15/2019 1014 Gross per 24 hour  Intake 440 ml  Output 1800 ml  Net -1360 ml   Filed Weights   09/13/19 0332  Weight: 72.6 kg   Body mass index is 25.82 kg/m.   Physical Exam: GENERAL: Patient is alert awake and oriented. Not in obvious distress.  Lying still in bed HENT: No scleral pallor or icterus. Pupils equally reactive to light. Oral mucosa is moist NECK: is  supple, no gross swelling noted. CHEST: Clear to auscultation. No crackles or wheezes.  Diminished breath sounds bilaterally.  Tenderness of the back- thoracic  spine on palpation. CVS: S1 and S2 heard, no murmur. Regular rate and rhythm.  ABDOMEN: Soft, non-tender, bowel sounds are present.   EXTREMITIES: No edema. CNS: Cranial nerves are intact. No focal motor deficits. SKIN: warm and dry without rashes.  Data Review: I have personally reviewed the following laboratory data and studies,  CBC: Recent Labs  Lab 09/12/19 2359 09/14/19 0424 09/15/19 0254  WBC 6.6 5.3 6.9  NEUTROABS 5.7 3.5  --   HGB 14.6 14.7 14.6  HCT 47.4* 48.1* 47.9*  MCV 86.0 87.1 89.2  PLT 492* 522* 123XX123*   Basic Metabolic Panel: Recent Labs  Lab 09/12/19 2359 09/14/19 0424 09/15/19 0254  NA 132* 133* 133*  K 3.9 4.1 4.2  CL 98 96* 96*  CO2 26 29 29   GLUCOSE 139* 106* 101*  BUN 12 9 12   CREATININE 0.59 0.64 0.49  CALCIUM 9.3 9.4 9.3  MG  --  2.1 2.1   Liver Function Tests: Recent Labs  Lab 09/14/19 0424 09/15/19 0254  AST 28 24  ALT 19 18  ALKPHOS 67 64  BILITOT 1.7* 1.4*  PROT 6.6 6.4*  ALBUMIN 3.9 3.6   No results for input(s): LIPASE, AMYLASE in the last 168 hours. No results for input(s): AMMONIA in the last 168 hours. Cardiac Enzymes: No results for input(s): CKTOTAL, CKMB, CKMBINDEX, TROPONINI in the last 168 hours. BNP (last 3 results) No results for input(s): BNP in the last 8760 hours.  ProBNP (last 3 results) No results for input(s): PROBNP in the last 8760 hours.  CBG: No results for input(s): GLUCAP in the last 168 hours. Recent Results (from the past 240 hour(s))  Respiratory Panel by RT PCR (Flu A&B, Covid) - Nasopharyngeal Swab     Status: None   Collection Time: 09/06/19 11:58 AM   Specimen: Nasopharyngeal Swab  Result Value Ref Range Status   SARS Coronavirus 2 by RT PCR NEGATIVE NEGATIVE Final    Comment: (NOTE) SARS-CoV-2 target nucleic acids are NOT  DETECTED. The SARS-CoV-2 RNA is generally detectable in upper respiratoy specimens during the acute phase of infection. The lowest concentration of SARS-CoV-2 viral copies this assay can detect is 131 copies/mL. A negative result does not preclude SARS-Cov-2 infection and should not be used as the sole basis for treatment or other patient management decisions. A negative result may occur with  improper specimen collection/handling, submission of specimen other than nasopharyngeal swab, presence of viral mutation(s) within the areas targeted by this assay, and inadequate number of viral copies (<131 copies/mL). A negative result must be combined with clinical observations, patient history, and epidemiological information. The expected result is Negative. Fact Sheet for Patients:  PinkCheek.be Fact Sheet for Healthcare Providers:  GravelBags.it This test is not yet ap proved or cleared by the [place]  States FDA and  has been authorized for detection and/or diagnosis of SARS-CoV-2 by FDA under an Emergency Use Authorization (EUA). This EUA will remain  in effect (meaning this test can be used) for the duration of the COVID-19 declaration under Section 564(b)(1) of the Act, 21 U.S.C. section 360bbb-3(b)(1), unless the authorization is terminated or revoked sooner.    Influenza A by PCR NEGATIVE NEGATIVE Final   Influenza B by PCR NEGATIVE NEGATIVE Final    Comment: (NOTE) The Xpert Xpress SARS-CoV-2/FLU/RSV assay is intended as an aid in  the diagnosis of influenza from Nasopharyngeal swab specimens and  should not be used as a sole basis for treatment. Nasal washings and  aspirates are unacceptable for Xpert Xpress SARS-CoV-2/FLU/RSV  testing. Fact Sheet for Patients: PinkCheek.be Fact Sheet for Healthcare Providers: GravelBags.it This test is not yet approved or cleared  by the [place] FDA and  has been authorized for detection and/or diagnosis of SARS-CoV-2 by  FDA under an Emergency Use Authorization (EUA). This EUA will remain  in effect (meaning this test can be used) for the duration of the  Covid-19 declaration under Section 564(b)(1) of the Act, 21  U.S.C. section 360bbb-3(b)(1), unless the authorization is  terminated or revoked. Performed at [facility], [place] [address], [place], [place] [address]   Respiratory Panel by RT PCR (Flu A&B, Covid) - Nasopharyngeal Swab     Status: None   Collection Time: 09/13/19  5:21 AM   Specimen: Nasopharyngeal Swab  Result Value Ref Range Status   SARS Coronavirus 2 by RT PCR NEGATIVE NEGATIVE Final    Comment: (NOTE) SARS-CoV-2 target nucleic acids are NOT DETECTED. The SARS-CoV-2 RNA is generally detectable in upper respiratoy specimens during the acute phase of infection. The lowest concentration of SARS-CoV-2 viral copies this assay can detect is 131 copies/mL. A negative result does not preclude SARS-Cov-2 infection and should not be used as the sole basis for treatment or other patient management decisions. A negative result may occur with  improper specimen collection/handling, submission of specimen other than nasopharyngeal swab, presence of viral mutation(s) within the areas targeted by this assay, and inadequate number of viral copies (<131 copies/mL). A negative result must be combined with clinical observations, patient history, and epidemiological information. The expected result is Negative. Fact Sheet for Patients:  PinkCheek.be Fact Sheet for Healthcare Providers:  GravelBags.it This test is not yet ap proved or cleared by the [place] FDA and  has been authorized for detection and/or diagnosis of SARS-CoV-2 by FDA under an Emergency Use Authorization (EUA). This EUA will remain  in effect (meaning  this test can be used) for the duration of the COVID-19 declaration under Section 564(b)(1) of the Act, 21 U.S.C. section 360bbb-3(b)(1), unless the authorization is terminated or revoked sooner.    Influenza A by PCR NEGATIVE NEGATIVE Final   Influenza B by PCR NEGATIVE NEGATIVE Final    Comment: (NOTE) The Xpert Xpress SARS-CoV-2/FLU/RSV assay is intended as an aid in  the diagnosis of influenza from Nasopharyngeal swab specimens and  should not be used as a sole basis for treatment. Nasal washings and  aspirates are unacceptable for Xpert Xpress SARS-CoV-2/FLU/RSV  testing. Fact Sheet for Patients: PinkCheek.be Fact Sheet for Healthcare Providers: GravelBags.it This test is not yet approved or cleared by the [place] FDA and  has been authorized for detection and/or diagnosis of SARS-CoV-2 by  FDA under an Emergency Use Authorization (EUA). This EUA will remain  in  effect (meaning this test can be used) for the duration of the  Covid-19 declaration under Section 564(b)(1) of the Act, 21  U.S.C. section 360bbb-3(b)(1), unless the authorization is  terminated or revoked. Performed at [facility], [place] [address], [place], [place] [address]      Studies: MR THORACIC SPINE WO CONTRAST  Result Date: 09/14/2019 CLINICAL DATA:  Fall. T9 compression fracture. EXAM: MRI THORACIC SPINE WITHOUT CONTRAST TECHNIQUE: Multiplanar, multisequence MR imaging of the thoracic spine was performed. No intravenous contrast was administered. COMPARISON:  CTA chest, abdomen, and pelvis 09/13/2019 FINDINGS: Alignment:  Normal. Vertebrae: T9 compression fracture with extensive marrow edema and 30% vertebral body height loss without retropulsion. Moderate marrow edema in the opposing T6 and T7 vertebral bodies without height loss, favored to be degenerative with Modic type 2 degenerative endplate changes also present at this  level. Remote L1 compression fracture status post augmentation. Nonspecific diffuse bone marrow heterogeneity. Cord:  Normal signal and morphology. Paraspinal and other soft tissues: Moderately large sliding hiatal hernia. Trace right pleural effusion. Disc levels: Mild disc bulging primarily in the mid to upper thoracic spine and mild thoracic facet arthrosis without spinal stenosis or spinal cord mass effect. Mild right and moderate left neural foraminal stenosis at T9-10. IMPRESSION: 1. Acute T9 compression fracture with 30% height loss. 2. Mild thoracic spondylosis and facet arthrosis without spinal stenosis. Electronically Signed   By: Logan Bores M.D.   On: 09/14/2019 14:58     Flora Lipps, MD  [facility] 09/15/2019

## 2019-09-16 LAB — CBC
HCT: 47.1 % — ABNORMAL HIGH (ref 36.0–46.0)
Hemoglobin: 14.9 g/dL (ref 12.0–15.0)
MCH: 27.7 pg (ref 26.0–34.0)
MCHC: 31.6 g/dL (ref 30.0–36.0)
MCV: 87.7 fL (ref 80.0–100.0)
Platelets: 451 10*3/uL — ABNORMAL HIGH (ref 150–400)
RBC: 5.37 MIL/uL — ABNORMAL HIGH (ref 3.87–5.11)
RDW: 23.8 % — ABNORMAL HIGH (ref 11.5–15.5)
WBC: 6.3 10*3/uL (ref 4.0–10.5)
nRBC: 0 % (ref 0.0–0.2)

## 2019-09-16 LAB — BASIC METABOLIC PANEL
Anion gap: 7 (ref 5–15)
BUN: 12 mg/dL (ref 8–23)
CO2: 30 mmol/L (ref 22–32)
Calcium: 9.2 mg/dL (ref 8.9–10.3)
Chloride: 98 mmol/L (ref 98–111)
Creatinine, Ser: 0.52 mg/dL (ref 0.44–1.00)
GFR calc Af Amer: >60 mL/min (ref >60)
GFR calc non Af Amer: >60 mL/min (ref >60)
Glucose, Bld: 98 mg/dL (ref 70–99)
Potassium: 3.9 mmol/L (ref 3.5–5.1)
Sodium: 135 mmol/L (ref 135–145)

## 2019-09-16 NOTE — Care Management Important Message (Signed)
Important Message  Patient Details IM Letter given to Secor Case Manager to present to the Patient Name: [name] MRN: [id number] Date of Birth: 30-Apr-1930   Medicare Important Message Given:  Yes     Kerin Salen 09/16/2019, 11:38 AM

## 2019-09-16 NOTE — Evaluation (Signed)
Physical Therapy Evaluation Patient Details Name: [name] MRN: [id number] DOB: [birth year] Today's Date: 09/16/2019   History of Present Illness  86 year old female with past medical history of coronary artery disease, depression, anxiety, gastroesophageal reflux disease, hyperlipidemia, hypertension, iron deficiency anemia and polycythemia vera who presented to the [facility] with complaints of severe back pain after working several days in her yard.  Patient initially presented to the ED, but no fracture noted so she was discharged to [facility] a skilled nursing facility.  She continued to have severe back pain and was unable to participate in any physical therapy so was brought back to the hospital.  CT scan of the chest abdomen showed T9 compression fracture. MRI with confirmation of fracture and 30% height loss.  Pt s/p kyphoplasty 09/15/19    Clinical Impression  Patient is s/p above surgery resulting in functional limitations due to the deficits listed below (see PT Problem List).  Patient will benefit from skilled PT to increase their independence and safety with mobility to allow discharge to the venue listed below.  Pt familiar with log roll technique and reviewed back precautions for pain control/comfort.  Pt ambulated in hallway and repositioned in recliner end of session.  Pt reports she lives alone and would need to be independent again to return home.  If home, recommend increasing home care and HHPT upon d/c.  Pt was admitted from SNF however, and requests not to return to previous facility however states she is open to [facility].     Follow Up Recommendations Supervision/Assistance - 24 hour;SNF(home if pt can have more home services, she lives alone)    Equipment Recommendations  None recommended by PT    Recommendations for Other Services       Precautions / Restrictions Precautions Precautions: Fall;Back Restrictions Weight Bearing Restrictions:  No      Mobility  Bed Mobility Overal bed mobility: Needs Assistance Bed Mobility: Rolling;Sidelying to Sit Rolling: Supervision Sidelying to sit: Supervision       General bed mobility comments: verbal cues for completing roll prior to sitting  Transfers Overall transfer level: Needs assistance Equipment used: Rolling walker (2 wheeled) Transfers: Sit to/from Stand Sit to Stand: Min guard         General transfer comment: min/guard for safety, cues to use legs to assist and not bend at waist  Ambulation/Gait Ambulation/Gait assistance: Min guard Gait Distance (Feet): 120 Feet Assistive device: Rolling walker (2 wheeled) Gait Pattern/deviations: Step-through pattern;Decreased stride length     General Gait Details: verbal cues for RW Positioning and turning as a unit (no twisting)  Stairs            Wheelchair Mobility    Modified Rankin (Stroke Patients Only)       Balance           Standing balance support: Bilateral upper extremity supported Standing balance-Leahy Scale: Poor Standing balance comment: reliant on BUE support in standing; pt reports poor baseline at baseline (occasionally using cane)                             Pertinent Vitals/Pain Pain Assessment: 0-10 Pain Score: 6  Pain Location: T9 level (states feels mostly muscular now) Pain Descriptors / Indicators: Sore;Spasm Pain Intervention(s): Repositioned;Monitored during session(declined warm compress, felt better sitting in recliner)    Home Living Family/patient expects to be discharged to:: Private residence Living Arrangements: Alone Available Help  at Discharge: Available PRN/intermittently Type of Home: House(town home) Home Access: Level entry     Home Layout: Able to live on main level with bedroom/bathroom Home Equipment: Gilford Rile - 2 wheels Additional Comments: pt admitted from SNF    Prior Function Level of Independence: Independent          Comments: drives, manages all affairs     Hand Dominance   Dominant Hand: Right    Extremity/Trunk Assessment        Lower Extremity Assessment Lower Extremity Assessment: Generalized weakness       Communication   Communication: No difficulties  Cognition Arousal/Alertness: Awake/alert Behavior During Therapy: WFL for tasks assessed/performed Overall Cognitive Status: Within Functional Limits for tasks assessed                                        General Comments      Exercises     Assessment/Plan    PT Assessment Patient needs continued PT services  PT Problem List Decreased strength;Decreased knowledge of precautions;Pain;Decreased range of motion;Decreased mobility;Decreased knowledge of use of DME;Decreased activity tolerance;Decreased safety awareness;Decreased balance       PT Treatment Interventions DME instruction;Functional mobility training;Patient/family education;Gait training;Therapeutic activities;Therapeutic exercise;Balance training    PT Goals (Current goals can be found in the Care Plan section)  Acute Rehab PT Goals PT Goal Formulation: With patient Time For Goal Achievement: 09/23/19 Potential to Achieve Goals: Good    Frequency Min 3X/week   Barriers to discharge        Co-evaluation               AM-PAC PT "6 Clicks" Mobility  Outcome Measure Help needed turning from your back to your side while in a flat bed without using bedrails?: A Little Help needed moving from lying on your back to sitting on the side of a flat bed without using bedrails?: A Little Help needed moving to and from a bed to a chair (including a wheelchair)?: A Little Help needed standing up from a chair using your arms (e.g., wheelchair or bedside chair)?: A Little Help needed to walk in hospital room?: A Little Help needed climbing 3-5 steps with a railing? : A Little 6 Click Score: 18    End of Session Equipment Utilized During  Treatment: (pt declined use of gait belt due to pain) Activity Tolerance: Patient tolerated treatment well Patient left: in chair;with call bell/phone within reach;with chair alarm set   PT Visit Diagnosis: Difficulty in walking, not elsewhere classified (R26.2);Unsteadiness on feet (R26.81)    Time: 1110-1130 PT Time Calculation (min) (ACUTE ONLY): 20 min   Charges:   PT Evaluation $PT Eval Low Complexity: 1 Low     Kati PT, DPT Acute Rehabilitation Services Office: [phone]    York Ram E 09/16/2019, 12:11 PM

## 2019-09-16 NOTE — Progress Notes (Signed)
PROGRESS NOTE  [name] [id number] DOB: [birth year] DOA: 09/12/2019 PCP: Colon Branch, MD   LOS: 3 days   Brief narrative: As per HPI,  86 year old female with past medical history of coronary artery disease, depression, anxiety, gastroesophageal reflux disease, hyperlipidemia, hypertension, iron deficiency anemia and polycythemia vera who presented to the [facility] with complaints of severe back pain after working several days in her yard.  Patient initially presented to the ED, but no fracture noted so she was discharged to [facility] a skilled nursing facility.  She continued to have severe back pain and was unable to participate in any physical therapy so was brought back to the hospital.   CT angiogram of the chest, abdomen, pelvis revealed a new T9 compression fracture thought to be the likely cause of the patient's severe intractable pain.    Patient was then admitted to the hospital for management of intractable back pain and consideration of kyphoplasty.  Assessment/Plan:  Principal Problem:   Intractable back pain Active Problems:   Essential hypertension   GERD without esophagitis   Compression fracture of T9 vertebra (HCC)   Chronic diastolic CHF (congestive heart failure) (HCC)   Overweight (BMI 25.0-29.9)   Constipation  Intractable back pain/T9 compression fracture: CT scan of the chest abdomen showed T9 compression fracture. MRI with confirmation of fracture and 30% height loss.  Status post IR guided kyphoplasty on 09/15/2019.  Patient still complains of pain with radiation.  Will apply warm compress.  Recommend skilled nursing facility placement on discharge.    Essential hypertension:   Continue metoprolol    GERD without esophagitis: Continue PPI.    Chronic diastolic CHF.  Appears compensated at this time.  Continue metoprolol.    Constipation: Had a bowel movement yesterday.  Continue miralax, senna.  Received Dulcolax suppository  yesterday.  Mild thrombocytosis.  Could be reactive.  Will closely monitor.  Hyponatremia.  Improved.  Check BMP in a.m.  VTE Prophylaxis: Lovenox subcu  Code Status: Full code  Family Communication: None today.  Spoke with the patient's daughter yesterday  Status is: Inpatient  Remains inpatient appropriate because:Ongoing active pain requiring inpatient pain management, status post kyphoplasty  Dispo: The patient is from: Skilled nursing facility              Anticipated d/c is to: SNF              Anticipated d/c date is 1 to 2 days.               Patient currently is not medically stable to d/c.  Due to persistent pain after procedure.  Consultants:  Interventional radiology  Procedures:  T9 IR guided kyphoplasty on 09/15/2019  Antibiotics:  . None  Anti-infectives (From admission, onward)   Start     Dose/Rate Route Frequency Ordered Stop   09/15/19 1530  clindamycin (CLEOCIN) IVPB 900 mg     900 mg 100 mL/hr over 30 Minutes Intravenous  Once 09/15/19 1554 09/15/19 1620   09/15/19 1500  clindamycin (CLEOCIN) IVPB 600 mg  Status:  Discontinued     600 mg 100 mL/hr over 30 Minutes Intravenous  Once 09/15/19 1023 09/15/19 1617     Subjective: Today, patient was seen and examined at bedside.  Had bowel movement yesterday but still complains of pain on the back radiating towards the subcostal regions.   Objective: Vitals:   09/15/19 2101 09/16/19 0438  BP: 137/71 (!) 156/70  Pulse: 62 64  Resp: 16 16  Temp: 98.3 F (36.8 C) 97.7 F (36.5 C)  SpO2: 96% 95%    Intake/Output Summary (Last 24 hours) at 09/16/2019 1311 Last data filed at 09/16/2019 0929 Gross per 24 hour  Intake 265 ml  Output --  Net 265 ml   Filed Weights   09/13/19 0332 09/15/19 1336  Weight: 72.6 kg 72.6 kg   Body mass index is 25.83 kg/m.   Physical Exam: GENERAL: Patient is alert awake and oriented. Not in obvious distress.  Lying still in bed HENT: No scleral pallor or icterus.  Pupils equally reactive to light. Oral mucosa is moist NECK: is supple, no gross swelling noted. CHEST: Clear to auscultation. No crackles or wheezes.  Diminished breath sounds bilaterally.  Tenderness of of the thoracic spine noted. CVS: S1 and S2 heard, no murmur. Regular rate and rhythm.  ABDOMEN: Soft, non-tender, bowel sounds are present.   EXTREMITIES: No edema. CNS: Cranial nerves are intact. No focal motor deficits. SKIN: warm and dry without rashes.  Data Review: I have personally reviewed the following laboratory data and studies,  CBC: Recent Labs  Lab 09/12/19 2359 09/14/19 0424 09/15/19 0254 09/16/19 0311  WBC 6.6 5.3 6.9 6.3  NEUTROABS 5.7 3.5  --   --   HGB 14.6 14.7 14.6 14.9  HCT 47.4* 48.1* 47.9* 47.1*  MCV 86.0 87.1 89.2 87.7  PLT 492* 522* 508* [id number]*   Basic Metabolic Panel: Recent Labs  Lab 09/12/19 2359 09/14/19 0424 09/15/19 0254 09/16/19 0311  NA 132* 133* 133* 135  K 3.9 4.1 4.2 3.9  CL 98 96* 96* 98  CO2 26 29 29 30   GLUCOSE 139* 106* 101* 98  BUN 12 9 12 12   CREATININE 0.59 0.64 0.49 0.52  CALCIUM 9.3 9.4 9.3 9.2  MG  --  2.1 2.1  --    Liver Function Tests: Recent Labs  Lab 09/14/19 0424 09/15/19 0254  AST 28 24  ALT 19 18  ALKPHOS 67 64  BILITOT 1.7* 1.4*  PROT 6.6 6.4*  ALBUMIN 3.9 3.6   No results for input(s): LIPASE, AMYLASE in the last 168 hours. No results for input(s): AMMONIA in the last 168 hours. Cardiac Enzymes: No results for input(s): CKTOTAL, CKMB, CKMBINDEX, TROPONINI in the last 168 hours. BNP (last 3 results) No results for input(s): BNP in the last 8760 hours.  ProBNP (last 3 results) No results for input(s): PROBNP in the last 8760 hours.  CBG: No results for input(s): GLUCAP in the last 168 hours. Recent Results (from the past 240 hour(s))  Respiratory Panel by RT PCR (Flu A&B, Covid) - Nasopharyngeal Swab     Status: None   Collection Time: 09/13/19  5:21 AM   Specimen: Nasopharyngeal Swab  Result  Value Ref Range Status   SARS Coronavirus 2 by RT PCR NEGATIVE NEGATIVE Final    Comment: (NOTE) SARS-CoV-2 target nucleic acids are NOT DETECTED. The SARS-CoV-2 RNA is generally detectable in upper respiratoy specimens during the acute phase of infection. The lowest concentration of SARS-CoV-2 viral copies this assay can detect is 131 copies/mL. A negative result does not preclude SARS-Cov-2 infection and should not be used as the sole basis for treatment or other patient management decisions. A negative result may occur with  improper specimen collection/handling, submission of specimen other than nasopharyngeal swab, presence of viral mutation(s) within the areas targeted by this assay, and inadequate number of viral copies (<131 copies/mL). A negative result must be combined with clinical  observations, patient history, and epidemiological information. The expected result is Negative. Fact Sheet for Patients:  PinkCheek.be Fact Sheet for Healthcare Providers:  GravelBags.it This test is not yet ap proved or cleared by the [place] FDA and  has been authorized for detection and/or diagnosis of SARS-CoV-2 by FDA under an Emergency Use Authorization (EUA). This EUA will remain  in effect (meaning this test can be used) for the duration of the COVID-19 declaration under Section 564(b)(1) of the Act, 21 U.S.C. section 360bbb-3(b)(1), unless the authorization is terminated or revoked sooner.    Influenza A by PCR NEGATIVE NEGATIVE Final   Influenza B by PCR NEGATIVE NEGATIVE Final    Comment: (NOTE) The Xpert Xpress SARS-CoV-2/FLU/RSV assay is intended as an aid in  the diagnosis of influenza from Nasopharyngeal swab specimens and  should not be used as a sole basis for treatment. Nasal washings and  aspirates are unacceptable for Xpert Xpress SARS-CoV-2/FLU/RSV  testing. Fact Sheet for  Patients: PinkCheek.be Fact Sheet for Healthcare Providers: GravelBags.it This test is not yet approved or cleared by the [place] FDA and  has been authorized for detection and/or diagnosis of SARS-CoV-2 by  FDA under an Emergency Use Authorization (EUA). This EUA will remain  in effect (meaning this test can be used) for the duration of the  Covid-19 declaration under Section 564(b)(1) of the Act, 21  U.S.C. section 360bbb-3(b)(1), unless the authorization is  terminated or revoked. Performed at [facility], [place] [address], [place], [place] [address]      Studies: MR THORACIC SPINE WO CONTRAST  Result Date: 09/14/2019 CLINICAL DATA:  Fall. T9 compression fracture. EXAM: MRI THORACIC SPINE WITHOUT CONTRAST TECHNIQUE: Multiplanar, multisequence MR imaging of the thoracic spine was performed. No intravenous contrast was administered. COMPARISON:  CTA chest, abdomen, and pelvis 09/13/2019 FINDINGS: Alignment:  Normal. Vertebrae: T9 compression fracture with extensive marrow edema and 30% vertebral body height loss without retropulsion. Moderate marrow edema in the opposing T6 and T7 vertebral bodies without height loss, favored to be degenerative with Modic type 2 degenerative endplate changes also present at this level. Remote L1 compression fracture status post augmentation. Nonspecific diffuse bone marrow heterogeneity. Cord:  Normal signal and morphology. Paraspinal and other soft tissues: Moderately large sliding hiatal hernia. Trace right pleural effusion. Disc levels: Mild disc bulging primarily in the mid to upper thoracic spine and mild thoracic facet arthrosis without spinal stenosis or spinal cord mass effect. Mild right and moderate left neural foraminal stenosis at T9-10. IMPRESSION: 1. Acute T9 compression fracture with 30% height loss. 2. Mild thoracic spondylosis and facet arthrosis without spinal  stenosis. Electronically Signed   By: Logan Bores M.D.   On: 09/14/2019 14:58   IR KYPHO THORACIC WITH BONE BIOPSY  Result Date: 09/15/2019 CLINICAL DATA:  86 year old female with acutely symptomatic T9 vertebral compression fracture. She presents for cement augmentation with kyphoplasty. EXAM: FLUOROSCOPIC GUIDED KYPHOPLASTY OF THE T9 VERTEBRAL BODY COMPARISON:  Thoracic spine MRI 09/14/2019 MEDICATIONS: As antibiotic prophylaxis, 900 mg clindamycin was ordered pre-procedure and administered intravenously within 1 hour of incision. ANESTHESIA/SEDATION: Moderate (conscious) sedation was employed during this procedure. A total of Versed 4 mg and Fentanyl 100 mcg was administered intravenously. Moderate Sedation Time: 41 minutes. The patient's level of consciousness and vital signs were monitored continuously by radiology nursing throughout the procedure under my direct supervision. FLUOROSCOPY TIME:  8 minutes, 54 seconds ([id number] mGy) COMPLICATIONS: None immediate. TECHNIQUE: The procedure, risks (including but not limited to  bleeding, infection, organ damage), benefits, and alternatives were explained to the patient. Questions regarding the procedure were encouraged and answered. The patient understands and consents to the procedure. The patient was placed prone on the fluoroscopic table. The skin overlying the upper thoracic region was then prepped and draped in the usual sterile fashion. Maximal barrier sterile technique was utilized including caps, mask, sterile gowns, sterile gloves, sterile drape, hand hygiene and skin antiseptic. Intravenous Fentanyl and Versed were administered as conscious sedation during continuous cardiorespiratory monitoring by the radiology RN. The left pedicle at T9 was then infiltrated with 1% lidocaine followed by the advancement of a Kyphon trocar needle through the left pedicle into the posterior one-third of the vertebral body. Subsequently, the osteo drill was advanced to the  anterior third of the vertebral body. The osteo drill was retracted. Through the working cannula, a Kyphon inflatable bone tamp 15 x 2 was advanced and positioned with the distal marker approximately 5 mm from the anterior aspect of the cortex. Appropriate positioning was confirmed on the AP projection. At this time, the balloon was expanded using contrast via a Kyphon inflation syringe device via micro tubing. In similar fashion, the right T9 pedicle was infiltrated with 1% lidocaine followed by the advancement of a second Kyphon trocar needle through the right pedicle into the posterior third of the vertebral body. Subsequently, the osteo drill was coaxially advanced to the anterior right third. The osteo drill was exchanged for a Kyphon inflatable bone tamp 15 x 2, advanced to the 5 mm of the anterior aspect of the cortex. The balloon was then expanded using contrast as above. Inflations were continued until there was near apposition with the superior end plate. At this time, methylmethacrylate mixture was reconstituted in the Kyphon bone mixing device system. This was then loaded into the delivery mechanism, attached to Kyphon bone fillers. The balloons were deflated and removed followed by the instillation of methylmethacrylate mixture with excellent filling in the AP and lateral projections. No extravasation was noted in the disk spaces or posteriorly into the spinal canal. No epidural venous contamination was seen. The working cannulae and the bone filler were then retrieved and removed. Hemostasis was achieved with manual compression. The patient tolerated the procedure well without immediate postprocedural complication. IMPRESSION: 1. Technically successful T9 vertebral body augmentation using balloon kyphoplasty. 2. Per CMS PQRS reporting requirements (PQRS Measure 24): Given the patient's age of greater than 41 and the fracture site (hip, distal radius, or spine), the patient should be tested for  osteoporosis using DXA, and the appropriate treatment considered based on the DXA results. Electronically Signed   By: Jacqulynn Cadet M.D.   On: 09/15/2019 17:08     Flora Lipps, MD  [facility] 09/16/2019

## 2019-09-16 NOTE — Evaluation (Signed)
Occupational Therapy Evaluation Patient Details Name: [name] MRN: [id number] DOB: 10/13/1929 Today's Date: 09/16/2019    History of Present Illness 86 year old female with past medical history of coronary artery disease, depression, anxiety, gastroesophageal reflux disease, hyperlipidemia, hypertension, iron deficiency anemia and polycythemia vera who presented to the [facility] with complaints of severe back pain after working several days in her yard.  Patient initially presented to the ED, but no fracture noted so she was discharged to [facility] a skilled nursing facility.  She continued to have severe back pain and was unable to participate in any physical therapy so was brought back to the hospital.  CT scan of the chest abdomen showed T9 compression fracture. MRI with confirmation of fracture and 30% height loss.  Pt s/p kyphoplasty 09/15/19   Clinical Impression   Patient with functional deficits listed below impacting independence with self care. Initiated education on body mechanics to minimize back pain/strain during self care, will benefit from further education. Patient supervision for bed mobility and min guard with x1 lateral loss of balance ambulating with walker from bathroom with min guard for safety to regain balance. Discuss with patient if she can stay with [facility] at discharge, patient states no. Would be agreeable to home with [facility] if patient could receive additional home services as she lives alone however if not possible recommend SNF.     Follow Up Recommendations  SNF;Other (comment)(vs home with maximal home services/HH)    Equipment Recommendations  3 in 1 bedside commode       Precautions / Restrictions Precautions Precautions: Fall;Back Precaution Booklet Issued: No Restrictions Weight Bearing Restrictions: No      Mobility Bed Mobility Overal bed mobility: Needs Assistance Bed Mobility: Rolling;Sidelying to Sit;Sit to  Sidelying Rolling: Supervision Sidelying to sit: Supervision     Sit to sidelying: Supervision General bed mobility comments: pt able to log roll to sit up without assist, min cues for technique when getting back into bed  Transfers Overall transfer level: Needs assistance Equipment used: Rolling walker (2 wheeled) Transfers: Sit to/from Stand Sit to Stand: Min guard         General transfer comment: min guard for safety    Balance Overall balance assessment: Needs assistance Sitting-balance support: Feet unsupported;No upper extremity supported Sitting balance-Leahy Scale: Good     Standing balance support: Bilateral upper extremity supported;During functional activity Standing balance-Leahy Scale: Poor Standing balance comment: reliant on B UE support                           ADL either performed or assessed with clinical judgement   ADL Overall ADL's : Needs assistance/impaired     Grooming: Wash/dry hands;Min guard;Standing   Upper Body Bathing: Supervision/ safety;Sitting   Lower Body Bathing: Moderate assistance;Sitting/lateral leans;Sit to/from stand   Upper Body Dressing : Supervision/safety;Sitting   Lower Body Dressing: Moderate assistance;Maximal assistance;Sitting/lateral leans;Sit to/from stand   Toilet Transfer: Min guard;Cueing for safety;Cueing for sequencing;RW;Regular Toilet;Grab bars;Ambulation Toilet Transfer Details (indicate cue type and reason): cues not to rotate spine when sitting or reaching to flush toilet Toileting- Clothing Manipulation and Hygiene: Min guard;Sit to/from stand       Functional mobility during ADLs: Min guard;Rolling walker;Cueing for safety General ADL Comments: x1 loss of balance ambulating from bathroom with min guard for safety     Vision Baseline Vision/History: Wears glasses Wears Glasses: At all times Patient Visual Report: No change from  baseline Vision Assessment?: No apparent visual deficits             Pertinent Vitals/Pain Pain Assessment: Faces Pain Score: 6  Faces Pain Scale: Hurts little more Pain Location: T9 level (states feels mostly muscular now) Pain Descriptors / Indicators: Sore;Spasm Pain Intervention(s): Monitored during session     Hand Dominance Right   Extremity/Trunk Assessment Upper Extremity Assessment Upper Extremity Assessment: Generalized weakness   Lower Extremity Assessment Lower Extremity Assessment: Defer to PT evaluation       Communication Communication Communication: No difficulties   Cognition Arousal/Alertness: Awake/alert Behavior During Therapy: WFL for tasks assessed/performed Overall Cognitive Status: Within Functional Limits for tasks assessed                                 General Comments: patient does lose train of thought on occasion but able to redirect self              Home Living Family/patient expects to be discharged to:: Private residence Living Arrangements: Alone Available Help at Discharge: Available PRN/intermittently Type of Home: House Home Access: Level entry     Home Layout: Able to live on main level with bedroom/bathroom     Bathroom Shower/Tub: Occupational psychologist: Standard     Home Equipment: Environmental consultant - 2 wheels;Shower seat;Hand held shower head   Additional Comments: pt admitted from SNF      Prior Functioning/Environment Level of Independence: Independent        Comments: drives, manages all affairs        OT Problem List: Decreased activity tolerance;Impaired balance (sitting and/or standing);Decreased safety awareness;Decreased knowledge of precautions;Pain      OT Treatment/Interventions: Self-care/ADL training;Therapeutic exercise;Energy conservation;DME and/or AE instruction;Therapeutic activities;Patient/family education;Balance training    OT Goals(Current goals can be found in the care plan section) Acute Rehab OT Goals Patient Stated  Goal: to be independent again OT Goal Formulation: With patient Time For Goal Achievement: 09/30/19 Potential to Achieve Goals: Good  OT Frequency: Min 2X/week   Barriers to D/C: Decreased caregiver support    AM-PAC OT "6 Clicks" Daily Activity     Outcome Measure Help from another person eating meals?: A Little Help from another person taking care of personal grooming?: A Little Help from another person toileting, which includes using toliet, bedpan, or urinal?: A Little Help from another person bathing (including washing, rinsing, drying)?: A Lot Help from another person to put on and taking off regular upper body clothing?: A Little Help from another person to put on and taking off regular lower body clothing?: A Lot 6 Click Score: 16   End of Session Equipment Utilized During Treatment: Rolling walker  Activity Tolerance: Patient tolerated treatment well Patient left: in bed;with call bell/phone within reach;with bed alarm set  OT Visit Diagnosis: Other abnormalities of gait and mobility (R26.89);Muscle weakness (generalized) (M62.81);History of falling (Z91.81);Pain Pain - part of body: (back)                Time: [id number] OT Time Calculation (min): 25 min Charges:  OT General Charges $OT Visit: 1 Visit OT Evaluation $OT Eval Moderate Complexity: 1 Mod OT Treatments $Self Care/Home Management : 8-22 mins  Delbert Phenix OT Pager: [place] 09/16/2019, 1:42 PM

## 2019-09-16 NOTE — Progress Notes (Signed)
Referring Physician(s): [name],L  Supervising Physician: Daryll Brod  Patient Status:  [facility] - In-pt  Chief Complaint:  Back pain,T9 fracture  Subjective: Pt cont to have some back and left subphrenic discomfort but overall improved since KP yesterday; denies extremity paresthesias; has been OOB   Allergies: Penicillins, Valsartan, Atorvastatin, Carvedilol, Ezetimibe, Fluvastatin sodium, Magnesium hydroxide, Meloxicam, Omeprazole magnesium, Pneumovax [pneumococcal polysaccharide vaccine], Quinapril hcl, Simvastatin, Topamax [topiramate], and Vit d-vit e-safflower oil  Medications: Prior to Admission medications   Medication Sig Start Date End Date Taking? Authorizing Provider  acetaminophen (TYLENOL 8 HOUR) 650 MG CR tablet Take 1 tablet (650 mg total) by mouth every 8 (eight) hours. 09/06/19  Yes Davonna Belling, MD  aspirin 81 MG tablet Take 81 mg by mouth daily.   Yes [provider]  docusate sodium (COLACE) 100 MG capsule Take 100 mg by mouth 2 (two) times daily.   Yes [provider]  HYDROcodone-acetaminophen (NORCO) 10-325 MG tablet Take 1 tablet by mouth every 8 (eight) hours as needed for moderate pain or severe pain.   Yes [provider]  hydroxyurea (HYDREA) 500 MG capsule Take 500 mg by mouth every other day. May take with food to minimize GI side effects.   Yes [provider]  Lidocaine 4 % PTCH Apply 1 patch topically 2 (two) times daily. Patient taking differently: Apply 1 patch topically daily. Remove after 12 hours 09/06/19  Yes Davonna Belling, MD  LORazepam (ATIVAN) 0.5 MG tablet TAKE 1/2 TABLET BY MOUTH EVERY MORNING AND EVERY AFTERNOON. THEN TAKE 2 TABLETS BY MOUTH AT BEDTIME AS NEEDED Patient taking differently: Take 0.25-1 mg by mouth See admin instructions. TAKE 0.25 BY MOUTH EVERY MORNING AND EVERY AFTERNOON. THEN TAKE 1mg  BY MOUTH AT BEDTIME AS NEEDED 08/19/19  Yes Colon Branch, MD  metoprolol tartrate (LOPRESSOR) 50  MG tablet Take 1 tablet (50 mg total) by mouth 2 (two) times daily. 06/08/19  Yes Paz, Alda Berthold, MD  omeprazole (PRILOSEC) 20 MG capsule Take 20 mg by mouth daily.   Yes [provider]  polyethylene glycol (MIRALAX / GLYCOLAX) 17 g packet Take 17 g by mouth daily.   Yes [provider]  acetaminophen-codeine (TYLENOL #3) 300-30 MG tablet TAKE 1 TABLET BY MOUTH EVERY 8 HOURS AS NEEDED FOR MIGRAINE Patient not taking: Reported on 09/13/2019 09/06/19   Davonna Belling, MD  bismuth subsalicylate (PEPTO BISMOL) 262 MG/15ML suspension Take 30 mLs by mouth daily as needed for indigestion or diarrhea or loose stools.     [provider]  hydrochlorothiazide (HYDRODIURIL) 25 MG tablet Take 1 tablet (25 mg total) by mouth daily as needed (swelling in the legs ). Patient not taking: Reported on 09/13/2019 07/05/16   Colon Branch, MD  mometasone (ELOCON) 0.1 % cream Apply 1 application topically daily. Patient not taking: Reported on 09/04/2019 03/13/18   Volanda Napoleon, MD  tizanidine (ZANAFLEX) 2 MG capsule Take 1 capsule (2 mg total) by mouth 3 (three) times daily as needed for muscle spasms. 09/06/19   Davonna Belling, MD  valACYclovir (VALTREX) 500 MG tablet Take 2 tablets at onset of rash and 2 tablets 12 hours later as needed. Patient not taking: Reported on 09/13/2019 02/16/19   Eliezer Bottom, NP     Vital Signs: BP (!) 156/70 (BP Location: Right Arm)   Pulse 64   Temp 97.7 F (36.5 C) (Oral)   Resp 16   Ht 5\' 6"  (1.676 m)   Wt 160  lb 0.9 oz (72.6 kg)   SpO2 95%   BMI 25.83 kg/m   Physical Exam awake/alert; puncture sites mid back clean and dry, sl tender to palpation, no hematomas; moving all fours ok  Imaging: MR THORACIC SPINE WO CONTRAST  Result Date: 09/14/2019 CLINICAL DATA:  Fall. T9 compression fracture. EXAM: MRI THORACIC SPINE WITHOUT CONTRAST TECHNIQUE: Multiplanar, multisequence MR imaging of the thoracic spine was performed. No intravenous contrast  was administered. COMPARISON:  CTA chest, abdomen, and pelvis 09/13/2019 FINDINGS: Alignment:  Normal. Vertebrae: T9 compression fracture with extensive marrow edema and 30% vertebral body height loss without retropulsion. Moderate marrow edema in the opposing T6 and T7 vertebral bodies without height loss, favored to be degenerative with Modic type 2 degenerative endplate changes also present at this level. Remote L1 compression fracture status post augmentation. Nonspecific diffuse bone marrow heterogeneity. Cord:  Normal signal and morphology. Paraspinal and other soft tissues: Moderately large sliding hiatal hernia. Trace right pleural effusion. Disc levels: Mild disc bulging primarily in the mid to upper thoracic spine and mild thoracic facet arthrosis without spinal stenosis or spinal cord mass effect. Mild right and moderate left neural foraminal stenosis at T9-10. IMPRESSION: 1. Acute T9 compression fracture with 30% height loss. 2. Mild thoracic spondylosis and facet arthrosis without spinal stenosis. Electronically Signed   By: Logan Bores M.D.   On: 09/14/2019 14:58   IR KYPHO THORACIC WITH BONE BIOPSY  Result Date: 09/15/2019 CLINICAL DATA:  86 year old female with acutely symptomatic T9 vertebral compression fracture. She presents for cement augmentation with kyphoplasty. EXAM: FLUOROSCOPIC GUIDED KYPHOPLASTY OF THE T9 VERTEBRAL BODY COMPARISON:  Thoracic spine MRI 09/14/2019 MEDICATIONS: As antibiotic prophylaxis, 900 mg clindamycin was ordered pre-procedure and administered intravenously within 1 hour of incision. ANESTHESIA/SEDATION: Moderate (conscious) sedation was employed during this procedure. A total of Versed 4 mg and Fentanyl 100 mcg was administered intravenously. Moderate Sedation Time: 41 minutes. The patient's level of consciousness and vital signs were monitored continuously by radiology nursing throughout the procedure under my direct supervision. FLUOROSCOPY TIME:  8 minutes, 54  seconds ([id number] mGy) COMPLICATIONS: None immediate. TECHNIQUE: The procedure, risks (including but not limited to bleeding, infection, organ damage), benefits, and alternatives were explained to the patient. Questions regarding the procedure were encouraged and answered. The patient understands and consents to the procedure. The patient was placed prone on the fluoroscopic table. The skin overlying the upper thoracic region was then prepped and draped in the usual sterile fashion. Maximal barrier sterile technique was utilized including caps, mask, sterile gowns, sterile gloves, sterile drape, hand hygiene and skin antiseptic. Intravenous Fentanyl and Versed were administered as conscious sedation during continuous cardiorespiratory monitoring by the radiology RN. The left pedicle at T9 was then infiltrated with 1% lidocaine followed by the advancement of a Kyphon trocar needle through the left pedicle into the posterior one-third of the vertebral body. Subsequently, the osteo drill was advanced to the anterior third of the vertebral body. The osteo drill was retracted. Through the working cannula, a Kyphon inflatable bone tamp 15 x 2 was advanced and positioned with the distal marker approximately 5 mm from the anterior aspect of the cortex. Appropriate positioning was confirmed on the AP projection. At this time, the balloon was expanded using contrast via a Kyphon inflation syringe device via micro tubing. In similar fashion, the right T9 pedicle was infiltrated with 1% lidocaine followed by the advancement of a second Kyphon trocar needle through the right pedicle into the posterior third of  the vertebral body. Subsequently, the osteo drill was coaxially advanced to the anterior right third. The osteo drill was exchanged for a Kyphon inflatable bone tamp 15 x 2, advanced to the 5 mm of the anterior aspect of the cortex. The balloon was then expanded using contrast as above. Inflations were continued until there was  near apposition with the superior end plate. At this time, methylmethacrylate mixture was reconstituted in the Kyphon bone mixing device system. This was then loaded into the delivery mechanism, attached to Kyphon bone fillers. The balloons were deflated and removed followed by the instillation of methylmethacrylate mixture with excellent filling in the AP and lateral projections. No extravasation was noted in the disk spaces or posteriorly into the spinal canal. No epidural venous contamination was seen. The working cannulae and the bone filler were then retrieved and removed. Hemostasis was achieved with manual compression. The patient tolerated the procedure well without immediate postprocedural complication. IMPRESSION: 1. Technically successful T9 vertebral body augmentation using balloon kyphoplasty. 2. Per CMS PQRS reporting requirements (PQRS Measure 24): Given the patient's age of greater than 31 and the fracture site (hip, distal radius, or spine), the patient should be tested for osteoporosis using DXA, and the appropriate treatment considered based on the DXA results. Electronically Signed   By: Jacqulynn Cadet M.D.   On: 09/15/2019 17:08   CT Angio Chest/Abd/Pel for Dissection W and/or Wo Contrast  Result Date: 09/13/2019 CLINICAL DATA:  Left flank pain. Low back pain. Concern for acute aortic syndrome. EXAM: CT ANGIOGRAPHY CHEST, ABDOMEN AND PELVIS TECHNIQUE: Non-contrast CT of the chest was initially obtained. Multidetector CT imaging through the chest, abdomen and pelvis was performed using the standard protocol during bolus administration of intravenous contrast. Multiplanar reconstructed images and MIPs were obtained and reviewed to evaluate the vascular anatomy. CONTRAST:  142mL OMNIPAQUE IOHEXOL 350 MG/ML SOLN COMPARISON:  218010 CT abdomen/pelvis. 06/24/2018 chest radiograph. 01/20/2015 chest CT angiogram. FINDINGS: CTA CHEST FINDINGS Cardiovascular: Top-normal heart size. No significant  pericardial effusion/thickening. Three-vessel coronary atherosclerosis. Atherosclerotic nonaneurysmal thoracic aorta. No acute intramural hematoma, dissection, pseudoaneurysm or penetrating atherosclerotic ulcer in the thoracic aorta. Aortic arch branch vessels are patent. Normal caliber pulmonary arteries. No central pulmonary emboli. Mediastinum/Nodes: No discrete thyroid nodules. Unremarkable esophagus. No pathologically enlarged axillary, mediastinal or hilar lymph nodes. Coarsely calcified granulomatous subcarinal and right hilar nodes. Lungs/Pleura: No pneumothorax. No pleural effusion. Mild compressive atelectasis in the medial lower lobes from the hiatal hernia. No acute consolidative airspace disease, lung masses or significant pulmonary nodules. Musculoskeletal: No aggressive appearing focal osseous lesions. Diffuse osteopenia. Mild to moderate T9 vertebral compression fracture, new since 01/20/2015 CT. Moderate thoracic spondylosis. Review of the MIP images confirms the above findings. CTA ABDOMEN AND PELVIS FINDINGS VASCULAR Aorta: Atherosclerotic nonaneurysmal abdominal aorta with no abdominal aortic dissection. Celiac: Moderate (approximately 50-60%) celiac atherosclerotic stenosis at the origin. SMA: Patent without evidence of aneurysm, dissection, vasculitis or significant stenosis. Renals: Both renal arteries are patent without evidence of aneurysm, dissection, vasculitis, fibromuscular dysplasia or significant stenosis. IMA: Probable moderate ostial stenosis by atherosclerotic plaque. Inflow: Patent without evidence of aneurysm, dissection, vasculitis or significant stenosis. Veins: No obvious venous abnormality within the limitations of this arterial phase study. Review of the MIP images confirms the above findings. NON-VASCULAR Hepatobiliary: Normal liver size. Scattered tiny liver granulomatous calcifications. No liver masses. Cholecystectomy. Bile ducts are stable and within normal post  cholecystectomy limits with CBD diameter 9 mm. Pancreas: Normal, with no mass or duct dilation. Spleen: Normal size  spleen. Scattered granulomatous splenic calcifications. No splenic masses. Adrenals/Urinary Tract: Normal adrenals. No renal stones. No hydronephrosis. No contour deforming renal masses. Normal bladder. Stomach/Bowel: Moderate hiatal hernia. Otherwise normal nondistended stomach. Normal caliber small bowel with no small bowel wall thickening. Appendectomy. Mild sigmoid diverticulosis with no large bowel wall thickening or significant pericolonic fat stranding. Vascular/Lymphatic: No pathologically enlarged lymph nodes in the abdomen or pelvis. Reproductive: Stable coarsely calcified 1.3 cm left uterine fibroid. No adnexal masses. Other: No pneumoperitoneum, ascites or focal fluid collection. Musculoskeletal: No aggressive appearing focal osseous lesions. Chronic moderate L2 vertebral compression fracture status post vertebroplasty. Chronic moderate L3 and L4 vertebral compression fractures are unchanged. Diffuse osteopenia. Moderate lumbar spondylosis. Review of the MIP images confirms the above findings. IMPRESSION: 1. No acute aortic syndrome. 2. Mild to moderate T9 vertebral compression fracture, new since [id number] CT, of uncertain chronicity, possibly acute. Chronic L2, L3 and L4 vertebral compression fractures. 3. Three-vessel coronary atherosclerosis. 4. Moderate hiatal hernia. 5. Mild sigmoid diverticulosis. 6. Aortic Atherosclerosis (ICD10-I70.0). Additional chronic findings as detailed. Electronically Signed   By: Ilona Sorrel M.D.   On: 09/13/2019 05:03    Labs:  CBC: Recent Labs    09/12/19 2359 09/14/19 0424 09/15/19 0254 09/16/19 0311  WBC 6.6 5.3 6.9 6.3  HGB 14.6 14.7 14.6 14.9  HCT 47.4* 48.1* 47.9* 47.1*  PLT 492* 522* 508* 451*    COAGS: Recent Labs    09/14/19 0424  INR 1.0    BMP: Recent Labs    09/12/19 2359 09/14/19 0424 09/15/19 0254 09/16/19 0311   NA 132* 133* 133* 135  K 3.9 4.1 4.2 3.9  CL 98 96* 96* 98  CO2 26 29 29 30   GLUCOSE 139* 106* 101* 98  BUN 12 9 12 12   CALCIUM 9.3 9.4 9.3 9.2  CREATININE 0.59 0.64 0.49 0.52  GFRNONAA >60 >60 >60 >60  GFRAA >60 >60 >60 >60    LIVER FUNCTION TESTS: Recent Labs    08/10/19 1153 09/04/19 1903 09/14/19 0424 09/15/19 0254  BILITOT 1.5* 1.4* 1.7* 1.4*  AST 15 21 28 24   ALT 8 14 19 18   ALKPHOS 67 60 67 64  PROT 7.0 6.6 6.6 6.4*  ALBUMIN 4.3 3.9 3.9 3.6    Assessment and Plan: Pt with hx mid back pain with rad to subphrenic regions secondary to acute T9 comp fracture; s/p T9 KP 5/4; afebrile; WBC nl; hgb stable; creat nl; warm compress to back prn; PT/OT   Electronically Signed: D. Rowe Robert, PA-C 09/16/2019, 10:35 AM   I spent a total of 15 minutes at the the patient's bedside AND on the patient's hospital floor or unit, greater than 50% of which was counseling/coordinating care for T9 kyphoplasty    Patient ID: [name], female   DOB: Oct 01, 1929, 86 y.o.   MRN: [id number]

## 2019-09-16 NOTE — Plan of Care (Signed)
Plan of care reviewed and discussed with the patient. 

## 2019-09-17 DIAGNOSIS — I5032 Chronic diastolic (congestive) heart failure: Secondary | ICD-10-CM | POA: Diagnosis not present

## 2019-09-17 DIAGNOSIS — Z7401 Bed confinement status: Secondary | ICD-10-CM | POA: Diagnosis not present

## 2019-09-17 DIAGNOSIS — M6281 Muscle weakness (generalized): Secondary | ICD-10-CM | POA: Diagnosis not present

## 2019-09-17 DIAGNOSIS — R52 Pain, unspecified: Secondary | ICD-10-CM | POA: Diagnosis not present

## 2019-09-17 DIAGNOSIS — J069 Acute upper respiratory infection, unspecified: Secondary | ICD-10-CM | POA: Diagnosis not present

## 2019-09-17 DIAGNOSIS — I428 Other cardiomyopathies: Secondary | ICD-10-CM | POA: Diagnosis not present

## 2019-09-17 DIAGNOSIS — S22070D Wedge compression fracture of T9-T10 vertebra, subsequent encounter for fracture with routine healing: Secondary | ICD-10-CM | POA: Diagnosis not present

## 2019-09-17 DIAGNOSIS — K219 Gastro-esophageal reflux disease without esophagitis: Secondary | ICD-10-CM | POA: Diagnosis not present

## 2019-09-17 DIAGNOSIS — I69328 Other speech and language deficits following cerebral infarction: Secondary | ICD-10-CM | POA: Diagnosis not present

## 2019-09-17 DIAGNOSIS — I2510 Atherosclerotic heart disease of native coronary artery without angina pectoris: Secondary | ICD-10-CM | POA: Diagnosis not present

## 2019-09-17 DIAGNOSIS — R2681 Unsteadiness on feet: Secondary | ICD-10-CM | POA: Diagnosis not present

## 2019-09-17 DIAGNOSIS — M810 Age-related osteoporosis without current pathological fracture: Secondary | ICD-10-CM | POA: Diagnosis not present

## 2019-09-17 DIAGNOSIS — S22070A Wedge compression fracture of T9-T10 vertebra, initial encounter for closed fracture: Secondary | ICD-10-CM | POA: Diagnosis not present

## 2019-09-17 DIAGNOSIS — M549 Dorsalgia, unspecified: Secondary | ICD-10-CM | POA: Diagnosis not present

## 2019-09-17 DIAGNOSIS — I7300 Raynaud's syndrome without gangrene: Secondary | ICD-10-CM | POA: Diagnosis not present

## 2019-09-17 DIAGNOSIS — D500 Iron deficiency anemia secondary to blood loss (chronic): Secondary | ICD-10-CM | POA: Diagnosis not present

## 2019-09-17 DIAGNOSIS — I10 Essential (primary) hypertension: Secondary | ICD-10-CM | POA: Diagnosis not present

## 2019-09-17 DIAGNOSIS — D751 Secondary polycythemia: Secondary | ICD-10-CM | POA: Diagnosis not present

## 2019-09-17 DIAGNOSIS — E663 Overweight: Secondary | ICD-10-CM | POA: Diagnosis not present

## 2019-09-17 DIAGNOSIS — C50919 Malignant neoplasm of unspecified site of unspecified female breast: Secondary | ICD-10-CM | POA: Diagnosis not present

## 2019-09-17 DIAGNOSIS — Z981 Arthrodesis status: Secondary | ICD-10-CM | POA: Diagnosis not present

## 2019-09-17 DIAGNOSIS — F341 Dysthymic disorder: Secondary | ICD-10-CM | POA: Diagnosis not present

## 2019-09-17 DIAGNOSIS — M1990 Unspecified osteoarthritis, unspecified site: Secondary | ICD-10-CM | POA: Diagnosis not present

## 2019-09-17 DIAGNOSIS — M2550 Pain in unspecified joint: Secondary | ICD-10-CM | POA: Diagnosis not present

## 2019-09-17 DIAGNOSIS — Z9889 Other specified postprocedural states: Secondary | ICD-10-CM | POA: Diagnosis not present

## 2019-09-17 DIAGNOSIS — L989 Disorder of the skin and subcutaneous tissue, unspecified: Secondary | ICD-10-CM | POA: Diagnosis not present

## 2019-09-17 DIAGNOSIS — M5489 Other dorsalgia: Secondary | ICD-10-CM | POA: Diagnosis not present

## 2019-09-17 DIAGNOSIS — K5900 Constipation, unspecified: Secondary | ICD-10-CM | POA: Diagnosis not present

## 2019-09-17 DIAGNOSIS — K5901 Slow transit constipation: Secondary | ICD-10-CM | POA: Diagnosis not present

## 2019-09-17 DIAGNOSIS — F29 Unspecified psychosis not due to a substance or known physiological condition: Secondary | ICD-10-CM | POA: Diagnosis not present

## 2019-09-17 DIAGNOSIS — R110 Nausea: Secondary | ICD-10-CM | POA: Diagnosis not present

## 2019-09-17 MED ORDER — CALCITONIN (SALMON) 200 UNIT/ACT NA SOLN: 1.0000 | Freq: Every day | NASAL | 0 refills | Status: DC

## 2019-09-17 MED ORDER — HYDROCODONE-ACETAMINOPHEN 10-325 MG PO TABS: 1.0000 | ORAL_TABLET | Freq: Three times a day (TID) | ORAL | 0 refills | Status: DC | PRN

## 2019-09-17 MED ORDER — BISACODYL 10 MG RE SUPP: 10.0000 mg | Freq: Every day | RECTAL | Status: DC | PRN

## 2019-09-17 MED ORDER — LORAZEPAM 0.5 MG PO TABS: ORAL_TABLET | ORAL | 0 refills | Status: DC

## 2019-09-17 NOTE — TOC Transition Note (Signed)
Transition of Care [facility]) - CM/SW Discharge Note   Patient Details  Name: [name] MRN: [id number] Date of Birth: [birth year]  [facility] CM/SW Contact:  Lennart Pall, LCSW Phone Number: 09/17/2019, 1:39 PM   Clinical Narrative:   Pt to [place] this afternoon via [place].  No further needs.    Final next level of care: Skilled Nursing Facility Barriers to Discharge: Barriers Resolved   Patient Goals and CMS Choice Patient states their goals for this hospitalization and ongoing recovery are:: hopeful to have kyphoplasty and be able to mobilize CMS Medicare.gov Compare Post Acute Care list provided to:: Patient Choice offered to / list presented to : Patient  Discharge Placement PASRR number recieved: 09/17/19            Patient chooses bed at: [facility] Patient to be transferred to facility by: [facility] Name of family member notified: daughter, [name] Patient and family notified of of transfer: 09/17/19  Discharge Plan and Services In-house Referral: Clinical Social Work                                   Social Determinants of Health (SDOH) Interventions     Readmission Risk Interventions Readmission Risk Prevention Plan 09/14/2019  Medication Screening Complete  Transportation Screening Complete  Some recent data might be hidden

## 2019-09-17 NOTE — Discharge Summary (Addendum)
Physician Discharge Summary  [name] [id number] DOB: Mar 14, 1930 DOA: 09/12/2019  PCP: Colon Branch, MD  Admit date: 09/12/2019 Discharge date: 09/17/2019  Admitted From: Skilled nursing facility  Discharge disposition: Skilled nursing facility   Recommendations for Outpatient Follow-Up:   . Follow up with your primary care provider at the skilled nursing facility in 3 to 5 days . Check CBC, BMP, magnesium in the next visit. . Patient chronically takes hydrocodone and Ativan which will be continued.  Discharge Diagnosis:   Principal Problem:   Intractable back pain Active Problems:   Essential hypertension   GERD without esophagitis   Compression fracture of T9 vertebra (HCC)   Chronic diastolic CHF (congestive heart failure) (HCC)   Overweight (BMI 25.0-29.9)   Constipation   Discharge Condition: Improved.  Diet recommendation: Low sodium, heart healthy.    Wound care: None.  Code status: Full.   History of Present Illness:   86 year old female with past medical history of coronary artery disease, depression, anxiety, gastroesophageal reflux disease, hyperlipidemia, hypertension, iron deficiency anemia and polycythemia vera who presented to the [facility] with complaints of severe back pain after working several days in her yard.  Patient initially presented to the ED, but no fracture noted so she was discharged to [facility] skilled nursing facility.  She continued to have severe back pain and was unable to participate in any physical therapy so was brought back to the hospital.  CT angiogram of the chest, abdomen, pelvis revealed a new T9 compression fracture thought to be the likely cause of the patient's severe intractable pain.   Patient was then admitted to the hospital for management of intractable back pain and conideration of kyphoplasty.   Hospital Course:   Following conditions were addressed during hospitalization as listed  below,  Intractable back pain/T9 compression fracture: CT scan of the chest abdomen showed T9 compression fracture. MRI with confirmation of fracture and 30% height loss.  Status post IR guided kyphoplasty on 09/15/2019.    Pain is much better today.  Continue warm compression analgesics and physical therapy on discharge  Essential hypertension:   Continue metoprolol  GERD without esophagitis: Continue PPI.  Chronic diastolic CHF.    Patient remained compensated. Continue metoprolol.  Constipation:  Continue bowel regimen on discharge  History of polycythemia, thrombocytosis.  Will closely monitor.  On Hydrea at home.  Will need to continue chest.  Patient follows up with oncology as outpatient.  Disposition.  At this time, patient is stable for disposition.  I tried to reach the patient's daughter but was unable to reach her today.  Medical Consultants:    Interventional radiology  Procedures:     T9 IR guided kyphoplasty on 09/15/2019 Subjective:   Today, patient complains of mild back pain has had a bowel movement yesterday.  Ambulated with physical therapy.  Discharge Exam:   Vitals:   09/16/19 2144 09/17/19 0547  BP: (!) 154/92 (!) 141/65  Pulse: 68 69  Resp: 16 16  Temp: 98.1 F (36.7 C) 98 F (36.7 C)  SpO2: 91% 98%   Vitals:   09/16/19 0438 09/16/19 1357 09/16/19 2144 09/17/19 0547  BP: (!) 156/70 (!) 131/52 (!) 154/92 (!) 141/65  Pulse: 64 (!) 59 68 69  Resp: 16 16 16 16   Temp: 97.7 F (36.5 C) 98.6 F (37 C) 98.1 F (36.7 C) 98 F (36.7 C)  TempSrc: Oral Oral Oral Oral  SpO2: 95% 95% 91% 98%  Weight:  Height:       General: Alert awake, communicative, not in obvious distress HENT: pupils equally reacting to light,  No scleral pallor or icterus noted. Oral mucosa is moist.  Chest:  Clear breath sounds.  Diminished breath sounds bilaterally. No crackles or wheezes.  Needle marks on the back.  No erythema, induration. CVS: S1 &S2 heard.  No murmur.  Regular rate and rhythm. Abdomen: Soft, nontender, nondistended.  Bowel sounds are heard.   Extremities: No cyanosis, clubbing or edema.  Peripheral pulses are palpable. Psych: Alert, awake and oriented, normal mood CNS:  No cranial nerve deficits.  Power equal in all extremities.   Skin: Warm and dry.  No rashes noted.  The results of significant diagnostics from this hospitalization (including imaging, microbiology, ancillary and laboratory) are listed below for reference.     Diagnostic Studies:   CT Angio Chest/Abd/Pel for Dissection W and/or Wo Contrast  Result Date: 09/13/2019 CLINICAL DATA:  Left flank pain. Low back pain. Concern for acute aortic syndrome. EXAM: CT ANGIOGRAPHY CHEST, ABDOMEN AND PELVIS TECHNIQUE: Non-contrast CT of the chest was initially obtained. Multidetector CT imaging through the chest, abdomen and pelvis was performed using the standard protocol during bolus administration of intravenous contrast. Multiplanar reconstructed images and MIPs were obtained and reviewed to evaluate the vascular anatomy. CONTRAST:  120mL OMNIPAQUE IOHEXOL 350 MG/ML SOLN COMPARISON:  218010 CT abdomen/pelvis. 06/24/2018 chest radiograph. 01/20/2015 chest CT angiogram. FINDINGS: CTA CHEST FINDINGS Cardiovascular: Top-normal heart size. No significant pericardial effusion/thickening. Three-vessel coronary atherosclerosis. Atherosclerotic nonaneurysmal thoracic aorta. No acute intramural hematoma, dissection, pseudoaneurysm or penetrating atherosclerotic ulcer in the thoracic aorta. Aortic arch branch vessels are patent. Normal caliber pulmonary arteries. No central pulmonary emboli. Mediastinum/Nodes: No discrete thyroid nodules. Unremarkable esophagus. No pathologically enlarged axillary, mediastinal or hilar lymph nodes. Coarsely calcified granulomatous subcarinal and right hilar nodes. Lungs/Pleura: No pneumothorax. No pleural effusion. Mild compressive atelectasis in the medial  lower lobes from the hiatal hernia. No acute consolidative airspace disease, lung masses or significant pulmonary nodules. Musculoskeletal: No aggressive appearing focal osseous lesions. Diffuse osteopenia. Mild to moderate T9 vertebral compression fracture, new since 01/20/2015 CT. Moderate thoracic spondylosis. Review of the MIP images confirms the above findings. CTA ABDOMEN AND PELVIS FINDINGS VASCULAR Aorta: Atherosclerotic nonaneurysmal abdominal aorta with no abdominal aortic dissection. Celiac: Moderate (approximately 50-60%) celiac atherosclerotic stenosis at the origin. SMA: Patent without evidence of aneurysm, dissection, vasculitis or significant stenosis. Renals: Both renal arteries are patent without evidence of aneurysm, dissection, vasculitis, fibromuscular dysplasia or significant stenosis. IMA: Probable moderate ostial stenosis by atherosclerotic plaque. Inflow: Patent without evidence of aneurysm, dissection, vasculitis or significant stenosis. Veins: No obvious venous abnormality within the limitations of this arterial phase study. Review of the MIP images confirms the above findings. NON-VASCULAR Hepatobiliary: Normal liver size. Scattered tiny liver granulomatous calcifications. No liver masses. Cholecystectomy. Bile ducts are stable and within normal post cholecystectomy limits with CBD diameter 9 mm. Pancreas: Normal, with no mass or duct dilation. Spleen: Normal size spleen. Scattered granulomatous splenic calcifications. No splenic masses. Adrenals/Urinary Tract: Normal adrenals. No renal stones. No hydronephrosis. No contour deforming renal masses. Normal bladder. Stomach/Bowel: Moderate hiatal hernia. Otherwise normal nondistended stomach. Normal caliber small bowel with no small bowel wall thickening. Appendectomy. Mild sigmoid diverticulosis with no large bowel wall thickening or significant pericolonic fat stranding. Vascular/Lymphatic: No pathologically enlarged lymph nodes in the  abdomen or pelvis. Reproductive: Stable coarsely calcified 1.3 cm left uterine fibroid. No adnexal masses. Other: No pneumoperitoneum, ascites or  focal fluid collection. Musculoskeletal: No aggressive appearing focal osseous lesions. Chronic moderate L2 vertebral compression fracture status post vertebroplasty. Chronic moderate L3 and L4 vertebral compression fractures are unchanged. Diffuse osteopenia. Moderate lumbar spondylosis. Review of the MIP images confirms the above findings. IMPRESSION: 1. No acute aortic syndrome. 2. Mild to moderate T9 vertebral compression fracture, new since [id number] CT, of uncertain chronicity, possibly acute. Chronic L2, L3 and L4 vertebral compression fractures. 3. Three-vessel coronary atherosclerosis. 4. Moderate hiatal hernia. 5. Mild sigmoid diverticulosis. 6. Aortic Atherosclerosis (ICD10-I70.0). Additional chronic findings as detailed. Electronically Signed   By: Ilona Sorrel M.D.   On: 09/13/2019 05:03     Labs:   Basic Metabolic Panel: Recent Labs  Lab 09/12/19 2359 09/12/19 2359 09/14/19 0424 09/14/19 0424 09/15/19 0254 09/16/19 0311  NA 132*  --  133*  --  133* 135  K 3.9   < > 4.1   < > 4.2 3.9  CL 98  --  96*  --  96* 98  CO2 26  --  29  --  29 30  GLUCOSE 139*  --  106*  --  101* 98  BUN 12  --  9  --  12 12  CREATININE 0.59  --  0.64  --  0.49 0.52  CALCIUM 9.3  --  9.4  --  9.3 9.2  MG  --   --  2.1  --  2.1  --    < > = values in this interval not displayed.   GFR Estimated Creatinine Clearance: 49.6 mL/min (by C-G formula based on SCr of 0.52 mg/dL). Liver Function Tests: Recent Labs  Lab 09/14/19 0424 09/15/19 0254  AST 28 24  ALT 19 18  ALKPHOS 67 64  BILITOT 1.7* 1.4*  PROT 6.6 6.4*  ALBUMIN 3.9 3.6   No results for input(s): LIPASE, AMYLASE in the last 168 hours. No results for input(s): AMMONIA in the last 168 hours. Coagulation profile Recent Labs  Lab 09/14/19 0424  INR 1.0    CBC: Recent Labs  Lab  09/12/19 2359 09/14/19 0424 09/15/19 0254 09/16/19 0311  WBC 6.6 5.3 6.9 6.3  NEUTROABS 5.7 3.5  --   --   HGB 14.6 14.7 14.6 14.9  HCT 47.4* 48.1* 47.9* 47.1*  MCV 86.0 87.1 89.2 87.7  PLT 492* 522* 508* 451*   Cardiac Enzymes: No results for input(s): CKTOTAL, CKMB, CKMBINDEX, TROPONINI in the last 168 hours. BNP: Invalid input(s): POCBNP CBG: No results for input(s): GLUCAP in the last 168 hours. D-Dimer No results for input(s): DDIMER in the last 72 hours. Hgb A1c No results for input(s): HGBA1C in the last 72 hours. Lipid Profile No results for input(s): CHOL, HDL, LDLCALC, TRIG, CHOLHDL, LDLDIRECT in the last 72 hours. Thyroid function studies No results for input(s): TSH, T4TOTAL, T3FREE, THYROIDAB in the last 72 hours.  Invalid input(s): FREET3 Anemia work up No results for input(s): VITAMINB12, FOLATE, FERRITIN, TIBC, IRON, RETICCTPCT in the last 72 hours. Microbiology Recent Results (from the past 240 hour(s))  Respiratory Panel by RT PCR (Flu A&B, Covid) - Nasopharyngeal Swab     Status: None   Collection Time: 09/13/19  5:21 AM   Specimen: Nasopharyngeal Swab  Result Value Ref Range Status   SARS Coronavirus 2 by RT PCR NEGATIVE NEGATIVE Final    Comment: (NOTE) SARS-CoV-2 target nucleic acids are NOT DETECTED. The SARS-CoV-2 RNA is generally detectable in upper respiratoy specimens during the acute phase of infection. The lowest concentration  of SARS-CoV-2 viral copies this assay can detect is 131 copies/mL. A negative result does not preclude SARS-Cov-2 infection and should not be used as the sole basis for treatment or other patient management decisions. A negative result may occur with  improper specimen collection/handling, submission of specimen other than nasopharyngeal swab, presence of viral mutation(s) within the areas targeted by this assay, and inadequate number of viral copies (<131 copies/mL). A negative result must be combined with  clinical observations, patient history, and epidemiological information. The expected result is Negative. Fact Sheet for Patients:  PinkCheek.be Fact Sheet for Healthcare Providers:  GravelBags.it This test is not yet ap proved or cleared by the [place] FDA and  has been authorized for detection and/or diagnosis of SARS-CoV-2 by FDA under an Emergency Use Authorization (EUA). This EUA will remain  in effect (meaning this test can be used) for the duration of the COVID-19 declaration under Section 564(b)(1) of the Act, 21 U.S.C. section 360bbb-3(b)(1), unless the authorization is terminated or revoked sooner.    Influenza A by PCR NEGATIVE NEGATIVE Final   Influenza B by PCR NEGATIVE NEGATIVE Final    Comment: (NOTE) The Xpert Xpress SARS-CoV-2/FLU/RSV assay is intended as an aid in  the diagnosis of influenza from Nasopharyngeal swab specimens and  should not be used as a sole basis for treatment. Nasal washings and  aspirates are unacceptable for Xpert Xpress SARS-CoV-2/FLU/RSV  testing. Fact Sheet for Patients: PinkCheek.be Fact Sheet for Healthcare Providers: GravelBags.it This test is not yet approved or cleared by the [place] FDA and  has been authorized for detection and/or diagnosis of SARS-CoV-2 by  FDA under an Emergency Use Authorization (EUA). This EUA will remain  in effect (meaning this test can be used) for the duration of the  Covid-19 declaration under Section 564(b)(1) of the Act, 21  U.S.C. section 360bbb-3(b)(1), unless the authorization is  terminated or revoked. Performed at [facility], [place] [address], [place], [place] [address]      Discharge Instructions:   Discharge Instructions    Diet - low sodium heart healthy   Complete by: As directed    Discharge instructions   Complete by: As directed     Continue physical therapy after discharge.  Follow-up with your primary care provider at the skilled nursing facility in 3 to 5 days.  Warm compression to the back three times a day.   Increase activity slowly   Complete by: As directed      Allergies as of 09/17/2019      Reactions   Penicillins Hives, Rash, Other (See Comments)   Has patient had a PCN reaction causing immediate rash, facial/tongue/throat swelling, SOB or lightheadedness with hypotension: Yes Has patient had a PCN reaction causing severe rash involving mucus membranes or skin necrosis: yes Has patient had a PCN reaction that required hospitalization: No Has patient had a PCN reaction occurring within the last 10 years: no If all of the above answers are "NO", then may proceed with Cephalosporin use. Other Reaction: OTHER REACTION   Valsartan Itching, Other (See Comments)   Atorvastatin Diarrhea   Carvedilol Other (See Comments)   "teeth hurt when I took it"   Ezetimibe Nausea Only   Fluvastatin Sodium Other (See Comments)   "Can't remember"   Magnesium Hydroxide Other (See Comments)   REACTION: triggers HAs   Meloxicam Other (See Comments)   Pt seeing auro's / spots.     Omeprazole Magnesium Other (See Comments)  auras   Pneumovax [pneumococcal Polysaccharide Vaccine] Other (See Comments)   Local reaction   Quinapril Hcl Other (See Comments)    "feelings of tiredness"   Simvastatin Diarrhea   Topamax [topiramate] Itching   Vit D-vit E-safflower Oil Other (See Comments)   Headaches      Medication List    STOP taking these medications   acetaminophen-codeine 300-30 MG tablet Commonly known as: TYLENOL #3   hydrochlorothiazide 25 MG tablet Commonly known as: HYDRODIURIL     TAKE these medications   acetaminophen 650 MG CR tablet Commonly known as: Tylenol 8 Hour Take 1 tablet (650 mg total) by mouth every 8 (eight) hours.   aspirin 81 MG tablet Take 81 mg by mouth daily.   bisacodyl 10 MG  suppository Commonly known as: DULCOLAX Place 1 suppository (10 mg total) rectally daily as needed for moderate constipation.   bismuth subsalicylate [id number] suspension Commonly known as: PEPTO BISMOL Take 30 mLs by mouth daily as needed for indigestion or diarrhea or loose stools.   calcitonin (salmon) 200 UNIT/ACT nasal spray Commonly known as: MIACALCIN/FORTICAL Place 1 spray into alternate nostrils daily for 24 days. Start taking on: Sep 18, 2019   docusate sodium 100 MG capsule Commonly known as: COLACE Take 100 mg by mouth 2 (two) times daily.   HYDROcodone-acetaminophen 10-325 MG tablet Commonly known as: NORCO Take 1 tablet by mouth every 8 (eight) hours as needed for moderate pain or severe pain.   hydroxyurea 500 MG capsule Commonly known as: HYDREA Take 500 mg by mouth every other day. May take with food to minimize GI side effects.   Lidocaine 4 % Ptch Apply 1 patch topically 2 (two) times daily. What changed:   when to take this  additional instructions   LORazepam 0.5 MG tablet Commonly known as: ATIVAN TAKE 1/2 TABLET BY MOUTH EVERY MORNING AND EVERY AFTERNOON. THEN TAKE 2 TABLETS BY MOUTH AT BEDTIME AS NEEDED What changed:   how much to take  how to take this  when to take this  additional instructions   metoprolol tartrate 50 MG tablet Commonly known as: LOPRESSOR Take 1 tablet (50 mg total) by mouth 2 (two) times daily.   mometasone 0.1 % cream Commonly known as: Elocon Apply 1 application topically daily.   omeprazole 20 MG capsule Commonly known as: PRILOSEC Take 20 mg by mouth daily.   polyethylene glycol 17 g packet Commonly known as: MIRALAX / GLYCOLAX Take 17 g by mouth daily.   tizanidine 2 MG capsule Commonly known as: Zanaflex Take 1 capsule (2 mg total) by mouth 3 (three) times daily as needed for muscle spasms.   valACYclovir 500 MG tablet Commonly known as: VALTREX Take 2 tablets at onset of rash and 2 tablets 12  hours later as needed.      Contact information for after-discharge care    Destination    [facility] Preferred SNF .   Service: Skilled Nursing Contact information: [place] [phone]               Time coordinating discharge: 39 minutes  Signed:  Bentlee Benningfield  [facility] 09/17/2019, 12:31 PM

## 2019-09-17 NOTE — NC FL2 (Signed)
[place] LEVEL OF CARE SCREENING TOOL     IDENTIFICATION  Patient Name: [name] Birthdate: 07-27-1929 Sex: female Admission Date (Current Location): 09/12/2019  [facility] and [place] Number:  Herbalist and Address:  [facility],  [place] [address], [place]      Provider Number: [id number]  Attending Physician Name and Address:  Flora Lipps, MD  Relative Name and Phone Number:  [name] [phone] or [phone]    Current Level of Care: Hospital Recommended Level of Care: [place] Prior Approval Number:    Date Approved/Denied:   PASRR Number: [id number]  Discharge Plan: SNF    Current Diagnoses: Patient Active Problem List   Diagnosis Date Noted  . Intractable back pain 09/13/2019  . Compression fracture of T9 vertebra ([place]) 09/13/2019  . Chronic diastolic CHF (congestive heart failure) ([place]) 09/13/2019  . Overweight (BMI 25.0-29.9) 09/13/2019  . Constipation 09/13/2019  . Raynaud's phenomenon without gangrene 05/19/2018  . Iron deficiency anemia due to chronic blood loss 12/30/2017  . Insomnia 06/25/2017  . Mild CAD 05/24/2017  . PCP NOTES >>> 02/15/2015  . Superficial thrombophlebitis 04/06/2014  . Annual physical exam 08/18/2012  . Edema 04/14/2012  . Breast cancer ([place]) 11/05/2011  . Polycythemia 11/05/2011  . TIA (transient ischemic attack) 01/16/2011  . CARDIOMYOPATHY 08/03/2009  . DJD -- pain mgmt 08/16/2008  . ABDOMINAL PAIN, LEFT LOWER QUADRANT 07/05/2008  . GAIT DISTURBANCE 10/21/2007  . ANXIETY DEPRESSION 10/08/2007  . Hyperlipidemia 06/25/2007  . Takotsubo cardiomyopathy 06/25/2007  . GERD without esophagitis 06/25/2007  . Essential hypertension 09/30/2006  . Osteoporosis 09/30/2006  . Migraine with aura 09/30/2006    Orientation RESPIRATION BLADDER Height & Weight     Self, Time, Situation, Place  Normal Continent Weight: 160 lb 0.9 oz (72.6 kg) Height:   5\' 6"  (167.6 cm)  BEHAVIORAL SYMPTOMS/MOOD NEUROLOGICAL BOWEL NUTRITION STATUS      Continent    AMBULATORY STATUS COMMUNICATION OF NEEDS Skin   Limited Assist Verbally Surgical wounds                       Personal Care Assistance Level of Assistance  Bathing, Dressing Bathing Assistance: Limited assistance Feeding assistance: Independent Dressing Assistance: Limited assistance     Functional Limitations Info             SPECIAL CARE FACTORS FREQUENCY  PT (By licensed PT), OT (By licensed OT)     PT Frequency: 5x/wk OT Frequency: 5x/wk            Contractures Contractures Info: Not present    Additional Factors Info  Code Status, Allergies Code Status Info: Full Allergies Info: see MAR           Current Medications (09/17/2019):  This is the current hospital active medication list Current Facility-Administered Medications  Medication Dose Route Frequency Provider Last Rate Last Admin  . acetaminophen (TYLENOL) tablet 650 mg  650 mg Oral Q6H PRN Shalhoub, Sherryll Burger, MD       Or  . acetaminophen (TYLENOL) suppository 650 mg  650 mg Rectal Q6H PRN Shalhoub, Sherryll Burger, MD      . aspirin EC tablet 81 mg  81 mg Oral Daily Shalhoub, Sherryll Burger, MD   81 mg at 09/17/19 0908  . bisacodyl (DULCOLAX) suppository 10 mg  10 mg Rectal Daily PRN [name], Laxman, MD      . calcitonin (salmon) (MIACALCIN/FORTICAL) nasal spray 1 spray  1 spray Alternating Nares Daily Shalhoub, Sherryll Burger, MD   1 spray at 09/17/19 0911  . docusate sodium (COLACE) capsule 100 mg  100 mg Oral BID Vernelle Emerald, MD   100 mg at 09/17/19 0907  . enoxaparin (LOVENOX) injection 40 mg  40 mg Subcutaneous Q24H Allred, Darrell K, PA-C   40 mg at 09/17/19 0909  . hydroxyurea (HYDREA) capsule 500 mg  500 mg Oral QODAY Shalhoub, Sherryll Burger, MD   500 mg at 09/16/19 1152  . lidocaine (LIDODERM) 5 % 1 patch  1 patch Transdermal Q24H Annita Brod, MD   1 patch at 09/16/19 1558  . LORazepam (ATIVAN) tablet  0.5 mg  0.5 mg Oral BID Vernelle Emerald, MD   0.5 mg at 09/17/19 0907  . metoprolol tartrate (LOPRESSOR) tablet 50 mg  50 mg Oral BID Vernelle Emerald, MD   50 mg at 09/17/19 0907  . oxyCODONE-acetaminophen (PERCOCET/ROXICET) 5-325 MG per tablet 1 tablet  1 tablet Oral Q4H PRN Shalhoub, Sherryll Burger, MD   1 tablet at 09/17/19 0440   Or  . morphine 4 MG/ML injection 4 mg  4 mg Intravenous Q4H PRN Vernelle Emerald, MD   4 mg at 09/17/19 0907  . ondansetron (ZOFRAN) tablet 4 mg  4 mg Oral Q6H PRN Shalhoub, Sherryll Burger, MD       Or  . ondansetron [facility]) injection 4 mg  4 mg Intravenous Q6H PRN Shalhoub, Sherryll Burger, MD   4 mg at 09/13/19 2043  . pantoprazole (PROTONIX) EC tablet 40 mg  40 mg Oral Daily Shalhoub, Sherryll Burger, MD   40 mg at 09/17/19 0907  . polyethylene glycol (MIRALAX / GLYCOLAX) packet 17 g  17 g Oral Once Shalhoub, Sherryll Burger, MD      . polyethylene glycol (MIRALAX / GLYCOLAX) packet 17 g  17 g Oral Daily PRN Shalhoub, Sherryll Burger, MD      . polyethylene glycol (MIRALAX / GLYCOLAX) packet 17 g  17 g Oral BID [name], Laxman, MD   17 g at 09/17/19 0915  . senna (SENOKOT) tablet 8.6 mg  1 tablet Oral Daily [name], Laxman, MD   8.6 mg at 09/17/19 0907     Discharge Medications: Please see discharge summary for a list of discharge medications.  Relevant Imaging Results:  Relevant Lab Results:   Additional Information [id number]  Lennart Pall, LCSW

## 2019-09-17 NOTE — Plan of Care (Signed)
  Problem: Education: Goal: Knowledge of General Education information will improve Description: Including pain rating scale, medication(s)/side effects and non-pharmacologic comfort measures Outcome: Progressing   Problem: Activity: Goal: Risk for activity intolerance will decrease Outcome: Progressing   Problem: Coping: Goal: Level of anxiety will decrease Outcome: Progressing   

## 2019-09-17 NOTE — TOC Progression Note (Addendum)
Transition of Care [facility]) - Progression Note    Patient Details  Name: [name] MRN: [id number] Date of Birth: Feb 25, 1930  Transition of [facility]) CM/SW Contact  Lennart Pall, LCSW Phone Number: 09/17/2019, 10:21 AM  Clinical Narrative:   Met with patient this morning who is now in agreement with plan for SNF.  Prefers [facility] or [facility].  Have begun bed search process.  Have left VM for daughter, [name] about plans.   Addendum:  Have received SNF bed offer from [facility] and pt has accepted.  Hopeful to get transferred today if possible.  Have spoken with daughter who is aware and agreeable.  Pt to complete her paperwork on when she arrives at facility.   Expected Discharge Plan: [place] [facility](vs SNF) Barriers to Discharge: Continued Medical Work up  Expected Discharge Plan and Services Expected Discharge Plan: [place] [facility](vs SNF) In-house Referral: Clinical Social Work     Living arrangements for the past 2 months: [place]                                       Social Determinants of Health (SDOH) Interventions    Readmission Risk Interventions Readmission Risk Prevention Plan 09/14/2019  Medication Screening Complete  Transportation Screening Complete  Some recent data might be hidden

## 2019-09-18 ENCOUNTER — Encounter: Payer: Self-pay | Admitting: Internal Medicine

## 2019-09-18 ENCOUNTER — Non-Acute Institutional Stay (SKILLED_NURSING_FACILITY): Payer: Medicare Other | Admitting: Internal Medicine

## 2019-09-18 DIAGNOSIS — R110 Nausea: Secondary | ICD-10-CM

## 2019-09-18 DIAGNOSIS — K219 Gastro-esophageal reflux disease without esophagitis: Secondary | ICD-10-CM

## 2019-09-18 DIAGNOSIS — I5032 Chronic diastolic (congestive) heart failure: Secondary | ICD-10-CM

## 2019-09-18 DIAGNOSIS — I10 Essential (primary) hypertension: Secondary | ICD-10-CM

## 2019-09-18 DIAGNOSIS — D500 Iron deficiency anemia secondary to blood loss (chronic): Secondary | ICD-10-CM

## 2019-09-18 DIAGNOSIS — S22070A Wedge compression fracture of T9-T10 vertebra, initial encounter for closed fracture: Secondary | ICD-10-CM | POA: Diagnosis not present

## 2019-09-18 DIAGNOSIS — M810 Age-related osteoporosis without current pathological fracture: Secondary | ICD-10-CM | POA: Diagnosis not present

## 2019-09-18 DIAGNOSIS — M549 Dorsalgia, unspecified: Secondary | ICD-10-CM | POA: Diagnosis not present

## 2019-09-18 DIAGNOSIS — K5901 Slow transit constipation: Secondary | ICD-10-CM | POA: Diagnosis not present

## 2019-09-18 NOTE — Progress Notes (Signed)
Patient ID: [name], female   DOB: November 25, 1929, 86 y.o.   MRN: [id number]  Provider:  Rexene Edison. Mariea Clonts, D.O., C.M.D. Location:  [place] Room Number: [place] of Service:  SNF (31)  PCP: Colon Branch, MD Patient Care Team: Colon Branch, MD as PCP - [place], Holli Humbles, NP as Nurse Practitioner (Nurse Practitioner) Volanda Napoleon, MD as Consulting Physician (Oncology) Melrose Nakayama, MD as Consulting Physician (Orthopedic Surgery)  Extended Emergency Contact Information Primary Emergency Contact: Hunter,[name] Address: [address]          [place], [place] [address] [place] Phone: [phone] Mobile Phone: [phone] Relation: Daughter Secondary Emergency Contact: Andrew Au Home Phone: [phone] Relation: Grandson  Code Status: full code Goals of Care: Advanced Directive information Advanced Directives 09/12/2019  Does Patient Have a Medical Advance Directive? No  Would patient like information on creating a medical advance directive? Yes (ED - Information included in AVS)  Pre-existing out of facility DNR order (yellow form or pink MOST form) -   Chief Complaint  Patient presents with  . New Admit To SNF    New admission to establish care     HPI: Patient is a 86 y.o. female seen today for admission to [facility] s/p hospitalization from 5/1 to 5/6 with T9 compression fracture causing severe intractable back pain and limiting ability to stand up and walk on her own.  She underwent balloon kyphoplasty with IR Dr. Laurence Ferrari on 09/15/19.  She had significant improvement in pain control, but reports she was getting her norco every 4 hrs there.    Had severe pain after norco reduced to q8h prn severe pain when transferred to facility from hospital.  She's also been nauseous.  Intake has not been very good.  She struggled for many days to have a bm, but has now had good success with 3 bms  since yesterday.    Past Medical History:  Diagnosis Date  . CAD (coronary artery disease)    mild nonobstructive disease on cath in 2003  . Cancer ([place])   . Cardiomyopathy    Probable Takotsubo, severe CP w/ normal cath in 1994. Severe CP in 2003 w/ widespread T wave inversions on ECG. Cath w/ minimal coronary disease but LV-gram showed periapical severe hypokinesis and basilar hyperkinesia (EF 40%). Last echo in 4/09 confirmed full LV functional recovery with EF 60%, no regional wall motion abnormalities, mild to moderate LVH.  Marland Kitchen Chronic diastolic CHF (congestive heart failure) ([place]) 09/13/2019  . CVA (cerebral infarction)   . Depression with anxiety 03-29-11   lost husband 3'09  . Diverticulitis   . DVT (deep venous thrombosis) ([place])    after venous ablation, R leg  . E. coli gastroenteritis 03-29-11   8'10  . Fracture 09/10/07   L2, status post vertebroplasty of L2 performed by IR  . GERD (gastroesophageal reflux disease)   . Headache(784.0)    migraines  . Hemorrhoids   . Hiatal hernia 03-29-11   no nerve problems  . Hyperlipidemia   . Hyperplastic colon polyp 06/2001  . Hypertension   . Iron deficiency anemia due to chronic blood loss 12/30/2017  . OA (osteoarthritis) of knee 03-29-11   w/ bilateral knee pain-not a problem now  . OSA (obstructive sleep apnea) 03-29-11   no cpap used, not a problem now.  . Osteoporosis   . Otalgia of both ears  Dr. Simeon Craft  . Polycythemia   . Stroke [facility]) 03-29-11   CVA x2 -last 10'12-?TIA(visual problems)  . Varicose vein    Past Surgical History:  Procedure Laterality Date  . APPENDECTOMY    . BACK SURGERY    . BREAST BIOPSY    . BREAST CYST EXCISION    . BREAST LUMPECTOMY Left 2013   left stage I left breast cancer  . CHOLECYSTECTOMY  04/09/2011   Procedure: LAPAROSCOPIC CHOLECYSTECTOMY WITH INTRAOPERATIVE CHOLANGIOGRAM;  Surgeon: Odis Hollingshead, MD;  Location: [facility]S;  Service: General;  Laterality: N/A;  . Dental Extraction      L maxillary molar  . IR KYPHO THORACIC WITH BONE BIOPSY  09/15/2019  . KYPHOSIS SURGERY  08/2007   cement used  . OVARIAN CYST SURGERY     left  . TONSILLECTOMY    . TOTAL KNEE ARTHROPLASTY  06/2010   left  . TUBAL LIGATION      reports that she quit smoking about 31 years ago. Her smoking use included cigarettes. She started smoking about 68 years ago. She has a 37.00 pack-year smoking history. She has never used smokeless tobacco. She reports current alcohol use of about 1.0 standard drinks of alcohol per week. She reports that she does not use drugs. Social History   Socioeconomic History  . Marital status: Widowed    Spouse name: Not on file  . Number of children: 3  . Years of education: Not on file  . Highest education level: Not on file  Occupational History  . Occupation: n/a  Tobacco Use  . Smoking status: Former Smoker    Packs/day: 1.00    Years: 37.00    Pack years: 37.00    Types: Cigarettes    Start date: 06/26/1951    Quit date: 05/14/1988    Years since quitting: 31.3  . Smokeless tobacco: Never Used  . Tobacco comment: quit 25 years ago  Substance and Sexual Activity  . Alcohol use: Yes    Alcohol/week: 1.0 standard drinks    Types: 1 Glasses of wine per week    Comment: occasional/social  . Drug use: No  . Sexual activity: Not Currently  Other Topics Concern  . Not on file  Social History Narrative   Lost husband 07/29/07-    Lives in [place] in a town house by herself, has 3 daughters, lost Mickel Baas [phone])   [place] lives in [place], [place] ( [place])     Still drives             Social Determinants of Health   Financial Resource Strain:   . Difficulty of Paying Living Expenses:   Food Insecurity:   . Worried About Charity fundraiser in the Last Year:   . Arboriculturist in the Last Year:   Transportation Needs:   . Film/video editor (Medical):   Marland Kitchen Lack of Transportation (Non-Medical):   Physical Activity:   . Days of Exercise  per Week:   . Minutes of Exercise per Session:   Stress:   . Feeling of Stress :   Social Connections:   . Frequency of Communication with Friends and Family:   . Frequency of Social Gatherings with Friends and Family:   . Attends Religious Services:   . Active Member of Clubs or Organizations:   . Attends Archivist Meetings:   Marland Kitchen Marital Status:   Intimate Partner Violence:   . Fear of Current or  Ex-Partner:   . Emotionally Abused:   Marland Kitchen Physically Abused:   . Sexually Abused:     Functional Status Survey:    Family History  Problem Relation Age of Onset  . Breast cancer Mother   . Heart disease Father        MIs  . Colon cancer Other        cousin  . Breast cancer Sister   . Leukemia Brother        GM    Health Maintenance  Topic Date Due  . COVID-19 Vaccine (1) Never done  . INFLUENZA VACCINE  12/13/2019  . DEXA SCAN  02/27/2022  . TETANUS/TDAP  12/19/2026  . PNA vac Low Risk Adult  Completed    Allergies  Allergen Reactions  . Penicillins Hives, Rash and Other (See Comments)    Has patient had a PCN reaction causing immediate rash, facial/tongue/throat swelling, SOB or lightheadedness with hypotension: Yes Has patient had a PCN reaction causing severe rash involving mucus membranes or skin necrosis: yes Has patient had a PCN reaction that required hospitalization: No Has patient had a PCN reaction occurring within the last 10 years: no If all of the above answers are "NO", then may proceed with Cephalosporin use.  Other Reaction: OTHER REACTION  . Valsartan Itching and Other (See Comments)  . Atorvastatin Diarrhea  . Carvedilol Other (See Comments)    "teeth hurt when I took it"   . Ezetimibe Nausea Only  . Fluvastatin Sodium Other (See Comments)    "Can't remember"  . Magnesium Hydroxide Other (See Comments)    REACTION: triggers HAs  . Meloxicam Other (See Comments)    Pt seeing auro's / spots.    . Omeprazole Magnesium Other (See  Comments)    auras  . Pneumovax [Pneumococcal Polysaccharide Vaccine] Other (See Comments)    Local reaction  . Quinapril Hcl Other (See Comments)     "feelings of tiredness"  . Simvastatin Diarrhea  . Topamax [Topiramate] Itching  . Vit D-Vit E-Safflower Oil Other (See Comments)    Headaches    Outpatient Encounter Medications as of 09/18/2019  Medication Sig  . acetaminophen (TYLENOL 8 HOUR) 650 MG CR tablet Take 1 tablet (650 mg total) by mouth every 8 (eight) hours.  Marland Kitchen aspirin 81 MG tablet Take 81 mg by mouth daily.  . bisacodyl (DULCOLAX) 10 MG suppository Place 1 suppository (10 mg total) rectally daily as needed for moderate constipation.  . bismuth subsalicylate (PEPTO BISMOL) 262 MG/15ML suspension Take 30 mLs by mouth daily as needed for indigestion or diarrhea or loose stools.   . calcitonin, salmon, (MIACALCIN/FORTICAL) 200 UNIT/ACT nasal spray Place 1 spray into alternate nostrils daily for 24 days.  Marland Kitchen docusate sodium (COLACE) 100 MG capsule Take 100 mg by mouth 2 (two) times daily.  Marland Kitchen HYDROcodone-acetaminophen (NORCO) 10-325 MG tablet Take 1 tablet by mouth every 8 (eight) hours as needed for moderate pain or severe pain.  . hydroxyurea (HYDREA) 500 MG capsule Take 500 mg by mouth every other day. May take with food to minimize GI side effects.  . Lidocaine 4 % PTCH Apply 1 patch topically 2 (two) times daily. (Patient taking differently: Apply 1 patch topically daily. Remove after 12 hours)  . LORazepam (ATIVAN) 0.5 MG tablet TAKE 1/2 TABLET BY MOUTH EVERY MORNING AND EVERY AFTERNOON. THEN TAKE 2 TABLETS BY MOUTH AT BEDTIME AS NEEDED  . metoprolol tartrate (LOPRESSOR) 50 MG tablet Take 1 tablet (50 mg total) by mouth  2 (two) times daily.  . mometasone (ELOCON) 0.1 % cream Apply 1 application topically daily. (Patient not taking: Reported on 09/04/2019)  . omeprazole (PRILOSEC) 20 MG capsule Take 20 mg by mouth daily.  . polyethylene glycol (MIRALAX / GLYCOLAX) 17 g packet Take  17 g by mouth daily.  . tizanidine (ZANAFLEX) 2 MG capsule Take 1 capsule (2 mg total) by mouth 3 (three) times daily as needed for muscle spasms.  . valACYclovir (VALTREX) 500 MG tablet Take 2 tablets at onset of rash and 2 tablets 12 hours later as needed. (Patient not taking: Reported on 09/13/2019)   No facility-administered encounter medications on file as of 09/18/2019.    Review of Systems  Constitutional: Positive for activity change. Negative for appetite change, chills and fever.  HENT: Negative for congestion and trouble swallowing.   Eyes: Negative for visual disturbance.  Respiratory: Negative for chest tightness and shortness of breath.   Gastrointestinal: Positive for nausea. Negative for abdominal pain, constipation, diarrhea and vomiting.  Genitourinary: Negative for dysuria.  Musculoskeletal: Positive for back pain and gait problem.  Skin: Positive for pallor.  Neurological: Positive for weakness. Negative for dizziness.  Hematological: Bruises/bleeds easily.  Psychiatric/Behavioral: Positive for sleep disturbance. Negative for confusion. The patient is not nervous/anxious.        Depression    Vitals:   09/18/19 1434  BP: 139/64  SpO2: 92%  Weight: 160 lb (72.6 kg)  Height: 5\' 6"  (1.676 m)   Body mass index is 25.82 kg/m. Physical Exam Vitals and nursing note reviewed.  Constitutional:      Appearance: She is ill-appearing (lying in bed, not wanting to sit up or roll over due to pain). She is not toxic-appearing.  HENT:     Head: Normocephalic and atraumatic.     Right Ear: External ear normal.     Left Ear: External ear normal.     Nose: Nose normal.     Mouth/Throat:     Mouth: Mucous membranes are dry.     Pharynx: Oropharynx is clear.  Cardiovascular:     Rate and Rhythm: Normal rate and regular rhythm.     Pulses: Normal pulses.     Heart sounds: Normal heart sounds.  Pulmonary:     Effort: Pulmonary effort is normal.     Breath sounds: Normal  breath sounds. No wheezing, rhonchi or rales.  Abdominal:     General: Bowel sounds are normal.     Palpations: Abdomen is soft.  Musculoskeletal:        General: Tenderness present.     Right lower leg: No edema.     Left lower leg: No edema.     Comments: Incision sites clean and dry w/o erythema, warmth or drainage   Skin:    General: Skin is warm and dry.     Coloration: Skin is pale.  Neurological:     General: No focal deficit present.     Mental Status: She is alert.     Motor: Weakness present.     Gait: Gait abnormal.  Psychiatric:     Comments: Seems down     Labs reviewed: Basic Metabolic Panel: Recent Labs    09/14/19 0424 09/15/19 0254 09/16/19 0311  NA 133* 133* 135  K 4.1 4.2 3.9  CL 96* 96* 98  CO2 29 29 30   GLUCOSE 106* 101* 98  BUN 9 12 12   CREATININE 0.64 0.49 0.52  CALCIUM 9.4 9.3 9.2  MG 2.1 2.1  --  Liver Function Tests: Recent Labs    09/04/19 1903 09/14/19 0424 09/15/19 0254  AST 21 28 24   ALT 14 19 18   ALKPHOS 60 67 64  BILITOT 1.4* 1.7* 1.4*  PROT 6.6 6.6 6.4*  ALBUMIN 3.9 3.9 3.6   No results for input(s): LIPASE, AMYLASE in the last 8760 hours. No results for input(s): AMMONIA in the last 8760 hours. CBC: Recent Labs    09/04/19 1903 09/04/19 1903 09/12/19 2359 09/12/19 2359 09/14/19 0424 09/15/19 0254 09/16/19 0311  WBC 10.1   < > 6.6   < > 5.3 6.9 6.3  NEUTROABS 8.4*  --  5.7  --  3.5  --   --   HGB 14.8   < > 14.6   < > 14.7 14.6 14.9  HCT 49.3*   < > 47.4*   < > 48.1* 47.9* 47.1*  MCV 88.4   < > 86.0   < > 87.1 89.2 87.7  PLT 470*   < > 492*   < > 522* 508* 451*   < > = values in this interval not displayed.   Cardiac Enzymes: No results for input(s): CKTOTAL, CKMB, CKMBINDEX, TROPONINI in the last 8760 hours. BNP: Invalid input(s): POCBNP No results found for: HGBA1C Lab Results  Component Value Date   TSH 2.963 12/04/2018   Lab Results  Component Value Date   VITAMINB12 279 07/25/2015   Lab  Results  Component Value Date   FOLATE 10.1 07/25/2015   Lab Results  Component Value Date   IRON 24 (L) 07/16/2019   TIBC 404 07/16/2019   FERRITIN 21 08/10/2019    Imaging and Procedures obtained prior to SNF admission: CT Angio Chest/Abd/Pel for Dissection W and/or Wo Contrast  Result Date: 09/13/2019 CLINICAL DATA:  Left flank pain. Low back pain. Concern for acute aortic syndrome. EXAM: CT ANGIOGRAPHY CHEST, ABDOMEN AND PELVIS TECHNIQUE: Non-contrast CT of the chest was initially obtained. Multidetector CT imaging through the chest, abdomen and pelvis was performed using the standard protocol during bolus administration of intravenous contrast. Multiplanar reconstructed images and MIPs were obtained and reviewed to evaluate the vascular anatomy. CONTRAST:  140mL OMNIPAQUE IOHEXOL 350 MG/ML SOLN COMPARISON:  218010 CT abdomen/pelvis. 06/24/2018 chest radiograph. 01/20/2015 chest CT angiogram. FINDINGS: CTA CHEST FINDINGS Cardiovascular: Top-normal heart size. No significant pericardial effusion/thickening. Three-vessel coronary atherosclerosis. Atherosclerotic nonaneurysmal thoracic aorta. No acute intramural hematoma, dissection, pseudoaneurysm or penetrating atherosclerotic ulcer in the thoracic aorta. Aortic arch branch vessels are patent. Normal caliber pulmonary arteries. No central pulmonary emboli. Mediastinum/Nodes: No discrete thyroid nodules. Unremarkable esophagus. No pathologically enlarged axillary, mediastinal or hilar lymph nodes. Coarsely calcified granulomatous subcarinal and right hilar nodes. Lungs/Pleura: No pneumothorax. No pleural effusion. Mild compressive atelectasis in the medial lower lobes from the hiatal hernia. No acute consolidative airspace disease, lung masses or significant pulmonary nodules. Musculoskeletal: No aggressive appearing focal osseous lesions. Diffuse osteopenia. Mild to moderate T9 vertebral compression fracture, new since 01/20/2015 CT. Moderate  thoracic spondylosis. Review of the MIP images confirms the above findings. CTA ABDOMEN AND PELVIS FINDINGS VASCULAR Aorta: Atherosclerotic nonaneurysmal abdominal aorta with no abdominal aortic dissection. Celiac: Moderate (approximately 50-60%) celiac atherosclerotic stenosis at the origin. SMA: Patent without evidence of aneurysm, dissection, vasculitis or significant stenosis. Renals: Both renal arteries are patent without evidence of aneurysm, dissection, vasculitis, fibromuscular dysplasia or significant stenosis. IMA: Probable moderate ostial stenosis by atherosclerotic plaque. Inflow: Patent without evidence of aneurysm, dissection, vasculitis or significant stenosis. Veins: No obvious venous abnormality within the  limitations of this arterial phase study. Review of the MIP images confirms the above findings. NON-VASCULAR Hepatobiliary: Normal liver size. Scattered tiny liver granulomatous calcifications. No liver masses. Cholecystectomy. Bile ducts are stable and within normal post cholecystectomy limits with CBD diameter 9 mm. Pancreas: Normal, with no mass or duct dilation. Spleen: Normal size spleen. Scattered granulomatous splenic calcifications. No splenic masses. Adrenals/Urinary Tract: Normal adrenals. No renal stones. No hydronephrosis. No contour deforming renal masses. Normal bladder. Stomach/Bowel: Moderate hiatal hernia. Otherwise normal nondistended stomach. Normal caliber small bowel with no small bowel wall thickening. Appendectomy. Mild sigmoid diverticulosis with no large bowel wall thickening or significant pericolonic fat stranding. Vascular/Lymphatic: No pathologically enlarged lymph nodes in the abdomen or pelvis. Reproductive: Stable coarsely calcified 1.3 cm left uterine fibroid. No adnexal masses. Other: No pneumoperitoneum, ascites or focal fluid collection. Musculoskeletal: No aggressive appearing focal osseous lesions. Chronic moderate L2 vertebral compression fracture status post  vertebroplasty. Chronic moderate L3 and L4 vertebral compression fractures are unchanged. Diffuse osteopenia. Moderate lumbar spondylosis. Review of the MIP images confirms the above findings. IMPRESSION: 1. No acute aortic syndrome. 2. Mild to moderate T9 vertebral compression fracture, new since [id number] CT, of uncertain chronicity, possibly acute. Chronic L2, L3 and L4 vertebral compression fractures. 3. Three-vessel coronary atherosclerosis. 4. Moderate hiatal hernia. 5. Mild sigmoid diverticulosis. 6. Aortic Atherosclerosis (ICD10-I70.0). Additional chronic findings as detailed. Electronically Signed   By: Ilona Sorrel M.D.   On: 09/13/2019 05:03    Assessment/Plan 1. Closed wedge compression fracture of T9 vertebra, initial encounter ([place]) -s/p kyphoplasty which reportedly gave good relief at the hospital so opioids reduced but now unable to participate in therapy due to severe pain initially here -add scheduled norco 10/325mg  daily--take with food to avoid nausea  -then q6h prn moderate to severe pain x 14 days -Tylenol 650mg  daily prn mild pain -cont xanaflex tid prn muscle spasms  2. Intractable back pain -due to #1 -here to get PT, OT  3. Chronic diastolic CHF (congestive heart failure) (H[facility]) -appears stable, follow facility protocol for weights   4. Slow transit constipation -cont colace, prn bisacodyl, daily miralax--bowels are now moving  5. Essential hypertension -bp at goal despite pain at present, cont same regimen and monitor  6. GERD without esophagitis -cont PPI and prn pepto for indigestion  7. Iron deficiency anemia due to chronic blood loss -but has polycythemia on hydroxyurea for mgt thru oncology  8. Nausea -Add back zofran 4mg  q 6h prn nausea  9. Senile osteoporosis -not on any treatment at this time except calcitonin with compression fx -will need vitamin D3 and ideally evenity outpatient  Family/ staff Communication: discussed with snf  nurse  Labs/tests ordered:  Cbc, bmp 5/10   [place] L. Bernadine Melecio, D.O. [facility] [address], [place] [address] Cell Phone (Mon-Fri 8am-5pm):  [phone] On Call:  [phone] & follow prompts after 5pm & weekends Office Phone:  [phone] Office Fax:  [phone]

## 2019-09-21 ENCOUNTER — Other Ambulatory Visit: Payer: Self-pay | Admitting: Internal Medicine

## 2019-09-21 MED ORDER — HYDROCODONE-ACETAMINOPHEN 10-325 MG PO TABS: 1.0000 | ORAL_TABLET | Freq: Four times a day (QID) | ORAL | 0 refills | Status: DC | PRN

## 2019-09-23 ENCOUNTER — Non-Acute Institutional Stay (SKILLED_NURSING_FACILITY): Payer: Medicare Other | Admitting: Internal Medicine

## 2019-09-23 ENCOUNTER — Encounter: Payer: Self-pay | Admitting: Internal Medicine

## 2019-09-23 DIAGNOSIS — L989 Disorder of the skin and subcutaneous tissue, unspecified: Secondary | ICD-10-CM | POA: Diagnosis not present

## 2019-09-23 DIAGNOSIS — D751 Secondary polycythemia: Secondary | ICD-10-CM

## 2019-09-23 DIAGNOSIS — Z9889 Other specified postprocedural states: Secondary | ICD-10-CM | POA: Diagnosis not present

## 2019-09-23 NOTE — Progress Notes (Signed)
Location:    [facility] Room Number: [id number] Place of Service:  SNF [phone]) Provider:  Norma Fredrickson, MD  Patient Care Team: Colon Branch, MD as PCP - [facility] [place], Holli Humbles, NP as Nurse Practitioner (Nurse Practitioner) Volanda Napoleon, MD as Consulting Physician (Oncology) Melrose Nakayama, MD as Consulting Physician (Orthopedic Surgery)  Extended Emergency Contact Information Primary Emergency Contact: Hunter,[name] Address: [address]          [place], [place] [address] [place] Phone: [phone] Mobile Phone: [phone] Relation: Daughter Secondary Emergency Contact: Andrew Au Home Phone: [phone] Relation: Grandson  Code Status:  Full Code Goals of care: Advanced Directive information Advanced Directives 09/23/2019  Does Patient Have a Medical Advance Directive? Yes  Type of Advance Directive (No Data)  Does patient want to make changes to medical advance directive? No - Patient declined  Would patient like information on creating a medical advance directive? -  Pre-existing out of facility DNR order (yellow form or pink MOST form) -     Chief Complaint  Patient presents with  . Follow-up    Abdominal Bruise    HPI:  Pt is a 86 y.o. female seen today for an acute visit for concerns about a left lower abdominal lesion which appears to be a bruise. She is here for rehab after hospitalization for T9 compression fracture caused severe back pain and ambulation difficulties.  She did undergo a balloon kyphoplasty on Sep 15, 2019.  And has significant improvement in pain control.  Her other diagnoses include a history of polycythemia-she is on Hydrea 500 mg every other day and appears to be doing well with this.  Her platelet count was 505,000 on lab done on May 10.  She also has received phlebotomy in the past-appears goal is to keep her Hemicrat  45 or below-on lab done on May 10 it was slightly  above that at 47.5--we will try to arrange follow-up with Dr. Landis Gandy oncology-hematology.  Per review of hematology note she also has received iron in the past.  In regards to her abdominal lesion-patient is concerned about a clot although this appears to be more of a bruise situation.  We will order a Doppler of an abundance of caution however.  She says is not really painful but with her history of polycythemia she is concerned.  Currently she is lying in bed comfortably she continues to be pleasant alert vital signs appear to be stable        Past Medical History:  Diagnosis Date  . CAD (coronary artery disease)    mild nonobstructive disease on cath in 2003  . Cancer ([facility] [facility])   . Cardiomyopathy    Probable Takotsubo, severe CP w/ normal cath in 1994. Severe CP in 2003 w/ widespread T wave inversions on ECG. Cath w/ minimal coronary disease but LV-gram showed periapical severe hypokinesis and basilar hyperkinesia (EF 40%). Last echo in 4/09 confirmed full LV functional recovery with EF 60%, no regional wall motion abnormalities, mild to moderate LVH.  Marland Kitchen Chronic diastolic CHF (congestive heart failure) ([facility]) 09/13/2019  . CVA (cerebral infarction)   . Depression with anxiety 03-29-11   lost husband 3'09  . Diverticulitis   . DVT (deep venous thrombosis) ([facility])    after venous ablation, R leg  . E. coli gastroenteritis 03-29-11   8'10  . Fracture 09/10/07   L2, status post vertebroplasty of L2 performed by IR  .  GERD (gastroesophageal reflux disease)   . Headache(784.0)    migraines  . Hemorrhoids   . Hiatal hernia 03-29-11   no nerve problems  . Hyperlipidemia   . Hyperplastic colon polyp 06/2001  . Hypertension   . Iron deficiency anemia due to chronic blood loss 12/30/2017  . OA (osteoarthritis) of knee 03-29-11   w/ bilateral knee pain-not a problem now  . OSA (obstructive sleep apnea) 03-29-11   no cpap used, not a problem now.  . Osteoporosis   . Otalgia of both  ears    Dr. Simeon Craft  . Polycythemia   . Stroke [facility]) 03-29-11   CVA x2 -last 10'12-?TIA(visual problems)  . Varicose vein    Past Surgical History:  Procedure Laterality Date  . APPENDECTOMY    . BACK SURGERY    . BREAST BIOPSY    . BREAST CYST EXCISION    . BREAST LUMPECTOMY Left 2013   left stage I left breast cancer  . CHOLECYSTECTOMY  04/09/2011   Procedure: LAPAROSCOPIC CHOLECYSTECTOMY WITH INTRAOPERATIVE CHOLANGIOGRAM;  Surgeon: Odis Hollingshead, MD;  Location: [facility] ORS;  Service: General;  Laterality: N/A;  . Dental Extraction     L maxillary molar  . IR KYPHO THORACIC WITH BONE BIOPSY  09/15/2019  . KYPHOSIS SURGERY  08/2007   cement used  . OVARIAN CYST SURGERY     left  . TONSILLECTOMY    . TOTAL KNEE ARTHROPLASTY  06/2010   left  . TUBAL LIGATION      Allergies  Allergen Reactions  . Penicillins Hives, Rash and Other (See Comments)    Has patient had a PCN reaction causing immediate rash, facial/tongue/throat swelling, SOB or lightheadedness with hypotension: Yes Has patient had a PCN reaction causing severe rash involving mucus membranes or skin necrosis: yes Has patient had a PCN reaction that required hospitalization: No Has patient had a PCN reaction occurring within the last 10 years: no If all of the above answers are "NO", then may proceed with Cephalosporin use.  Other Reaction: OTHER REACTION  . Valsartan Itching and Other (See Comments)  . Atorvastatin Diarrhea  . Carvedilol Other (See Comments)    "teeth hurt when I took it"   . Ezetimibe Nausea Only  . Fluvastatin Sodium Other (See Comments)    "Can't remember"  . Magnesium Hydroxide Other (See Comments)    REACTION: triggers HAs  . Meloxicam Other (See Comments)    Pt seeing auro's / spots.    . Pneumovax [Pneumococcal Polysaccharide Vaccine] Other (See Comments)    Local reaction  . Quinapril Hcl Other (See Comments)     "feelings of tiredness"  . Simvastatin Diarrhea  . Topamax [Topiramate]  Itching  . Vit D-Vit E-Safflower Oil Other (See Comments)    Headaches    Outpatient Encounter Medications as of 09/23/2019  Medication Sig  . acetaminophen (TYLENOL 8 HOUR) 650 MG CR tablet Take 1 tablet (650 mg total) by mouth every 8 (eight) hours.  Marland Kitchen aspirin 81 MG tablet Take 81 mg by mouth daily.  . bisacodyl (DULCOLAX) 10 MG suppository Place 1 suppository (10 mg total) rectally daily as needed for moderate constipation.  . bismuth subsalicylate (PEPTO BISMOL) 262 MG/15ML suspension Take 30 mLs by mouth daily as needed for indigestion or diarrhea or loose stools.   . docusate sodium (COLACE) 100 MG capsule Take 100 mg by mouth 2 (two) times daily.  Marland Kitchen HYDROcodone-acetaminophen (NORCO) 10-325 MG tablet Take 1 tablet by mouth daily. For 14  days  . hydroxyurea (HYDREA) 500 MG capsule Take 500 mg by mouth every other day. May take with food to minimize GI side effects.  . Lidocaine 4 % PTCH LIDOCAINE 4% PATCH :Apply 1 patch topically daily and remove after 12 hours  . LORazepam (ATIVAN) 0.5 MG tablet TAKE 1/2 TABLET BY MOUTH EVERY MORNING AND EVERY AFTERNOON. THEN TAKE 2 TABLETS BY MOUTH AT BEDTIME AS NEEDED  . metoprolol tartrate (LOPRESSOR) 50 MG tablet Take 1 tablet (50 mg total) by mouth 2 (two) times daily.  . NON FORMULARY DIET :Regular NAS Heart heallthy  . Nutritional Supplements (BOOST BREEZE PO) Take by mouth. Boost Breeze qd d/t poor appetite.  Marland Kitchen omeprazole (PRILOSEC) 20 MG capsule Take 20 mg by mouth daily.  . ondansetron (ZOFRAN) 4 MG tablet Take 4 mg by mouth every 6 (six) hours as needed for nausea or vomiting.  . polyethylene glycol (MIRALAX / GLYCOLAX) 17 g packet Take 17 g by mouth daily.  . tizanidine (ZANAFLEX) 2 MG capsule Take 1 capsule (2 mg total) by mouth 3 (three) times daily as needed for muscle spasms.  . valACYclovir (VALTREX) 500 MG tablet Take 2 tablets at onset of rash and 2 tablets 12 hours later as needed.  . [DISCONTINUED] HYDROcodone-acetaminophen (NORCO)  10-325 MG tablet Take 1 tablet by mouth every 6 (six) hours as needed.  . [DISCONTINUED] mometasone (ELOCON) 0.1 % cream Apply 1 application topically daily.   No facility-administered encounter medications on file as of 09/23/2019.    Review of Systems    General she is not complaining of any fever or chills.  Skin does not complain of any rashes itching diaphoresis does have the area on her left lower abdomen as noted above.  Head ears eyes nose mouth and throat is not complain of visual changes or sore throat.  Respiratory does not complain of being short of breath or having a cough.  Cardiac does not complain of chest pain or increasing edema.  GI is not complaining of abdominal discomfort nausea vomiting diarrhea constipation.  GU is not complaining of dysuria.  Musculoskeletal is not complaining of joint pain currently or back pain-apparently does have some back pain more with movement..  Neurologic does not complain of dizziness headache syncope or numbness.  And psych not complaining of being depressed or anxious although she does appear to have some history of anxiety and continues on Ativan up to 3 times a day    Immunization History  Administered Date(s) Administered  . Fluad Quad(high Dose 65+) 01/26/2019  . Influenza Split 03/21/2011  . Influenza, High Dose Seasonal PF 02/15/2015, 05/24/2017, 02/14/2018  . Influenza,inj,Quad PF,6+ Mos 03/19/2014, 02/10/2016  . Influenza-Unspecified 02/20/2018  . Pneumococcal Conjugate-13 03/19/2014  . Pneumococcal Polysaccharide-23 08/18/2012  . Td 10/12/2005, 12/18/2016   Pertinent  Health Maintenance Due  Topic Date Due  . INFLUENZA VACCINE  12/13/2019  . DEXA SCAN  02/27/2022  . PNA vac Low Risk Adult  Completed   Fall Risk  07/15/2018 06/24/2017 05/10/2016 02/10/2016 01/09/2016  Falls in the past year? 0 No No Yes No  Number falls in past yr: - - - 1 -  Injury with Fall? - - - No -  Risk Factor Category  - - - - -  Risk  for fall due to : - - - - -  Risk for fall due to: Comment - - - - -   Functional Status Survey:    Vitals:   09/23/19 1655  BP:  120/71  Pulse: 68  Resp: 16  Temp: (!) 97.5 F (36.4 C)  TempSrc: Oral  SpO2: 92%  Weight: 153 lb 12.8 oz (69.8 kg)  Height: 5\' 6"  (1.676 m)   Body mass index is 24.82 kg/m. Physical Exam General this is a pleasant elderly female in no distress lying comfortably in bed.  Her skin is warm and dry on the lower left abdomen there is a small somewhat brawny appearing area this appears to be more like a bruise there is some slight elevation in the middle it is minimally tender to palpation I do not see any surrounding erythema-or concerning warmth.  Eyes visual acuity appears to be intact sclera and conjunctive are clear.  Oropharynx is clear mucous membranes moist.  Chest is clear to auscultation there is no labored breathing.  Heart is regular rate and rhythm without murmur gallop or rub she does not have significant lower extremity edema.  Her abdomen somewhat obese soft nontender with positive bowel sounds she does have the small area on the left side of her abdomen as noted above.  Musculoskeletal is able to move all extremities x4 it appears at relative baseline.  Neurologic is grossly intact her speech is clear cannot appreciate lateralizing findings.  Psych she is alert and oriented pleasant and appropriate.   Labs reviewed:  Sep 21, 2019.  WBC 5.6 hemoglobin 15.2 platelets 505.  Hcrit--was 47.5.  Sodium was 134 potassium 4.6 BUN 6.2 creatinine 0.6-magnesium is 2.1-calcium was 10.4.   Recent Labs    09/14/19 0424 09/15/19 0254 09/16/19 0311  NA 133* 133* 135  K 4.1 4.2 3.9  CL 96* 96* 98  CO2 29 29 30   GLUCOSE 106* 101* 98  BUN 9 12 12   CREATININE 0.64 0.49 0.52  CALCIUM 9.4 9.3 9.2  MG 2.1 2.1  --    Recent Labs    09/04/19 1903 09/14/19 0424 09/15/19 0254  AST 21 28 24   ALT 14 19 18   ALKPHOS 60 67 64  BILITOT  1.4* 1.7* 1.4*  PROT 6.6 6.6 6.4*  ALBUMIN 3.9 3.9 3.6   Recent Labs    09/04/19 1903 09/04/19 1903 09/12/19 2359 09/12/19 2359 09/14/19 0424 09/15/19 0254 09/16/19 0311  WBC 10.1   < > 6.6   < > 5.3 6.9 6.3  NEUTROABS 8.4*  --  5.7  --  3.5  --   --   HGB 14.8   < > 14.6   < > 14.7 14.6 14.9  HCT 49.3*   < > 47.4*   < > 48.1* 47.9* 47.1*  MCV 88.4   < > 86.0   < > 87.1 89.2 87.7  PLT 470*   < > 492*   < > 522* 508* 451*   < > = values in this interval not displayed.   Lab Results  Component Value Date   TSH 2.963 12/04/2018   No results found for: HGBA1C Lab Results  Component Value Date   CHOL 212 (H) 03/19/2014   HDL 47.20 03/19/2014   LDLCALC 143 (H) 03/19/2014   LDLDIRECT 142.3 06/25/2011   TRIG 110.0 03/19/2014   CHOLHDL 4 03/19/2014    Significant Diagnostic Results in last 30 days:  DG Thoracic Spine 2 View  Result Date: 09/04/2019 CLINICAL DATA:  86 year old with back pain. EXAM: THORACIC SPINE 2 VIEWS COMPARISON:  None. FINDINGS: Please note there are only 11 rib-bearing thoracic vertebra. Bones are diffusely under mineralized. No evidence of acute compression fracture. Mild degenerative  disc disease. No paravertebral soft tissue abnormality to suggest acute fracture. IMPRESSION: 1. No evidence of acute fracture of the thoracic spine. 2. Diffuse osteopenia/osteoporosis with mild degenerative change. Electronically Signed   By: Keith Rake M.D.   On: 09/04/2019 19:51   DG Lumbar Spine Complete  Result Date: 09/04/2019 CLINICAL DATA:  86 year old with back pain. Pain onset this morning. EXAM: LUMBAR SPINE - COMPLETE 4+ VIEW COMPARISON:  Lumbar MRI 10/27/2016, abdominal CT reformats 07/01/2017 FINDINGS: Bone significantly under mineralized. L2 compression fracture with vertebral augmentation. Chronic L4 and L3 compression fractures from prior imaging. No evidence of acute compression fracture. Multilevel degenerative disc disease and facet arthropathy.  IMPRESSION: 1. Diffuse osteopenia with multilevel degenerative change. No evidence of acute abnormality. 2. Chronic L2, L3, and L4 compression fractures, prior vertebral augmentation at L2. Electronically Signed   By: Keith Rake M.D.   On: 09/04/2019 19:47   MR THORACIC SPINE WO CONTRAST  Result Date: 09/14/2019 CLINICAL DATA:  Fall. T9 compression fracture. EXAM: MRI THORACIC SPINE WITHOUT CONTRAST TECHNIQUE: Multiplanar, multisequence MR imaging of the thoracic spine was performed. No intravenous contrast was administered. COMPARISON:  CTA chest, abdomen, and pelvis 09/13/2019 FINDINGS: Alignment:  Normal. Vertebrae: T9 compression fracture with extensive marrow edema and 30% vertebral body height loss without retropulsion. Moderate marrow edema in the opposing T6 and T7 vertebral bodies without height loss, favored to be degenerative with Modic type 2 degenerative endplate changes also present at this level. Remote L1 compression fracture status post augmentation. Nonspecific diffuse bone marrow heterogeneity. Cord:  Normal signal and morphology. Paraspinal and other soft tissues: Moderately large sliding hiatal hernia. Trace right pleural effusion. Disc levels: Mild disc bulging primarily in the mid to upper thoracic spine and mild thoracic facet arthrosis without spinal stenosis or spinal cord mass effect. Mild right and moderate left neural foraminal stenosis at T9-10. IMPRESSION: 1. Acute T9 compression fracture with 30% height loss. 2. Mild thoracic spondylosis and facet arthrosis without spinal stenosis. Electronically Signed   By: Logan Bores M.D.   On: 09/14/2019 14:58   IR KYPHO THORACIC WITH BONE BIOPSY  Result Date: 09/15/2019 CLINICAL DATA:  86 year old female with acutely symptomatic T9 vertebral compression fracture. She presents for cement augmentation with kyphoplasty. EXAM: FLUOROSCOPIC GUIDED KYPHOPLASTY OF THE T9 VERTEBRAL BODY COMPARISON:  Thoracic spine MRI 09/14/2019  MEDICATIONS: As antibiotic prophylaxis, 900 mg clindamycin was ordered pre-procedure and administered intravenously within 1 hour of incision. ANESTHESIA/SEDATION: Moderate (conscious) sedation was employed during this procedure. A total of Versed 4 mg and Fentanyl 100 mcg was administered intravenously. Moderate Sedation Time: 41 minutes. The patient's level of consciousness and vital signs were monitored continuously by radiology nursing throughout the procedure under my direct supervision. FLUOROSCOPY TIME:  8 minutes, 54 seconds ([id number] mGy) COMPLICATIONS: None immediate. TECHNIQUE: The procedure, risks (including but not limited to bleeding, infection, organ damage), benefits, and alternatives were explained to the patient. Questions regarding the procedure were encouraged and answered. The patient understands and consents to the procedure. The patient was placed prone on the fluoroscopic table. The skin overlying the upper thoracic region was then prepped and draped in the usual sterile fashion. Maximal barrier sterile technique was utilized including caps, mask, sterile gowns, sterile gloves, sterile drape, hand hygiene and skin antiseptic. Intravenous Fentanyl and Versed were administered as conscious sedation during continuous cardiorespiratory monitoring by the radiology RN. The left pedicle at T9 was then infiltrated with 1% lidocaine followed by the advancement of a Kyphon trocar needle through  the left pedicle into the posterior one-third of the vertebral body. Subsequently, the osteo drill was advanced to the anterior third of the vertebral body. The osteo drill was retracted. Through the working cannula, a Kyphon inflatable bone tamp 15 x 2 was advanced and positioned with the distal marker approximately 5 mm from the anterior aspect of the cortex. Appropriate positioning was confirmed on the AP projection. At this time, the balloon was expanded using contrast via a Kyphon inflation syringe device via  micro tubing. In similar fashion, the right T9 pedicle was infiltrated with 1% lidocaine followed by the advancement of a second Kyphon trocar needle through the right pedicle into the posterior third of the vertebral body. Subsequently, the osteo drill was coaxially advanced to the anterior right third. The osteo drill was exchanged for a Kyphon inflatable bone tamp 15 x 2, advanced to the 5 mm of the anterior aspect of the cortex. The balloon was then expanded using contrast as above. Inflations were continued until there was near apposition with the superior end plate. At this time, methylmethacrylate mixture was reconstituted in the Kyphon bone mixing device system. This was then loaded into the delivery mechanism, attached to Kyphon bone fillers. The balloons were deflated and removed followed by the instillation of methylmethacrylate mixture with excellent filling in the AP and lateral projections. No extravasation was noted in the disk spaces or posteriorly into the spinal canal. No epidural venous contamination was seen. The working cannulae and the bone filler were then retrieved and removed. Hemostasis was achieved with manual compression. The patient tolerated the procedure well without immediate postprocedural complication. IMPRESSION: 1. Technically successful T9 vertebral body augmentation using balloon kyphoplasty. 2. Per CMS PQRS reporting requirements (PQRS Measure 24): Given the patient's age of greater than 14 and the fracture site (hip, distal radius, or spine), the patient should be tested for osteoporosis using DXA, and the appropriate treatment considered based on the DXA results. Electronically Signed   By: Jacqulynn Cadet M.D.   On: 09/15/2019 17:08   CT Angio Chest/Abd/Pel for Dissection W and/or Wo Contrast  Result Date: 09/13/2019 CLINICAL DATA:  Left flank pain. Low back pain. Concern for acute aortic syndrome. EXAM: CT ANGIOGRAPHY CHEST, ABDOMEN AND PELVIS TECHNIQUE: Non-contrast  CT of the chest was initially obtained. Multidetector CT imaging through the chest, abdomen and pelvis was performed using the standard protocol during bolus administration of intravenous contrast. Multiplanar reconstructed images and MIPs were obtained and reviewed to evaluate the vascular anatomy. CONTRAST:  146mL OMNIPAQUE IOHEXOL 350 MG/ML SOLN COMPARISON:  218010 CT abdomen/pelvis. 06/24/2018 chest radiograph. 01/20/2015 chest CT angiogram. FINDINGS: CTA CHEST FINDINGS Cardiovascular: Top-normal heart size. No significant pericardial effusion/thickening. Three-vessel coronary atherosclerosis. Atherosclerotic nonaneurysmal thoracic aorta. No acute intramural hematoma, dissection, pseudoaneurysm or penetrating atherosclerotic ulcer in the thoracic aorta. Aortic arch branch vessels are patent. Normal caliber pulmonary arteries. No central pulmonary emboli. Mediastinum/Nodes: No discrete thyroid nodules. Unremarkable esophagus. No pathologically enlarged axillary, mediastinal or hilar lymph nodes. Coarsely calcified granulomatous subcarinal and right hilar nodes. Lungs/Pleura: No pneumothorax. No pleural effusion. Mild compressive atelectasis in the medial lower lobes from the hiatal hernia. No acute consolidative airspace disease, lung masses or significant pulmonary nodules. Musculoskeletal: No aggressive appearing focal osseous lesions. Diffuse osteopenia. Mild to moderate T9 vertebral compression fracture, new since 01/20/2015 CT. Moderate thoracic spondylosis. Review of the MIP images confirms the above findings. CTA ABDOMEN AND PELVIS FINDINGS VASCULAR Aorta: Atherosclerotic nonaneurysmal abdominal aorta with no abdominal aortic dissection. Celiac: Moderate (approximately  50-60%) celiac atherosclerotic stenosis at the origin. SMA: Patent without evidence of aneurysm, dissection, vasculitis or significant stenosis. Renals: Both renal arteries are patent without evidence of aneurysm, dissection, vasculitis,  fibromuscular dysplasia or significant stenosis. IMA: Probable moderate ostial stenosis by atherosclerotic plaque. Inflow: Patent without evidence of aneurysm, dissection, vasculitis or significant stenosis. Veins: No obvious venous abnormality within the limitations of this arterial phase study. Review of the MIP images confirms the above findings. NON-VASCULAR Hepatobiliary: Normal liver size. Scattered tiny liver granulomatous calcifications. No liver masses. Cholecystectomy. Bile ducts are stable and within normal post cholecystectomy limits with CBD diameter 9 mm. Pancreas: Normal, with no mass or duct dilation. Spleen: Normal size spleen. Scattered granulomatous splenic calcifications. No splenic masses. Adrenals/Urinary Tract: Normal adrenals. No renal stones. No hydronephrosis. No contour deforming renal masses. Normal bladder. Stomach/Bowel: Moderate hiatal hernia. Otherwise normal nondistended stomach. Normal caliber small bowel with no small bowel wall thickening. Appendectomy. Mild sigmoid diverticulosis with no large bowel wall thickening or significant pericolonic fat stranding. Vascular/Lymphatic: No pathologically enlarged lymph nodes in the abdomen or pelvis. Reproductive: Stable coarsely calcified 1.3 cm left uterine fibroid. No adnexal masses. Other: No pneumoperitoneum, ascites or focal fluid collection. Musculoskeletal: No aggressive appearing focal osseous lesions. Chronic moderate L2 vertebral compression fracture status post vertebroplasty. Chronic moderate L3 and L4 vertebral compression fractures are unchanged. Diffuse osteopenia. Moderate lumbar spondylosis. Review of the MIP images confirms the above findings. IMPRESSION: 1. No acute aortic syndrome. 2. Mild to moderate T9 vertebral compression fracture, new since [id number] CT, of uncertain chronicity, possibly acute. Chronic L2, L3 and L4 vertebral compression fractures. 3. Three-vessel coronary atherosclerosis. 4. Moderate hiatal  hernia. 5. Mild sigmoid diverticulosis. 6. Aortic Atherosclerosis (ICD10-I70.0). Additional chronic findings as detailed. Electronically Signed   By: Ilona Sorrel M.D.   On: 09/13/2019 05:03    Assessment/Plan   #1 history of left lower abdominal lesion-at this point appears to be more of a bruised type area but she is concerned with her history of polycythemia-we will obtain a Doppler of the area to be safe-and monitor for any changes like increased size color changes increased pain.  2.  History of polycythemia-this is followed by Dr. Marin Olp of hematology oncology-there is recommendation is for possible phlebotomy if Hcrit exceeds 45-nursing staff has contacted Dr. Antonieta Pert office for follow-up of this-patient states she was due for an appointment.  .  Follow-up labs have been ordered for next week.  3.  History of minimally elevated calcium on recent metabolic panel she is on calcitonin-at this point will monitor and recheck her labs next week to see if this is a true value.  4.  History of recent kyphoplasty she appears to be doing well with this she is on calcitonin at this point pain appears to be controlled on current medications she does have orders for Norco 10-325 mg in the morning as well as every 6 hours as needed. Also has as needed Tylenol as well as Zanaflex 3 times a day as needed and Lidoderm patch   #5 history of anemia-she has received IV iron in the past this is followed by hematology as well-last hemoglobin was 15.2-update labs have been ordered.  6.  History of hypertension this appears stable with recent systolics in the [id number] she is on metoprolol 50 mg twice daily.  7.  History of anxiety-appears she has a fairly significant history of this-she is on Ativan 0.5 mg nightly and 0.25 mg during the day-at this point appears to be  stable-she does have somewhat of an anxious presentation per nursing-but Ativan appears to help and is well-tolerated.    [id number]--of note  greater than 35 minutes were spent assessing patient-discussing her status with nursing staff-reviewing her chart and labs including consult notes with hematology oncology-coordinating and formulating a plan of care for numerous diagnoses-of note greater than 50% of time spent coordinating and formulating a plan of care with input as noted above

## 2019-09-24 ENCOUNTER — Other Ambulatory Visit: Payer: Self-pay | Admitting: *Deleted

## 2019-09-24 ENCOUNTER — Encounter: Payer: Self-pay | Admitting: Internal Medicine

## 2019-09-24 NOTE — Patient Outreach (Signed)
Late entry for 09/23/19  Screened for potential [facility] Care Management needs as a benefit of  [employer].  Member is currently receiving skilled therapy at [facility].  Writer attended telephonic interdisciplinary team meeting to assess for disposition needs and transition plan for resident. Transition plan is for member to return home. Lives alone. Progressing with therapy.   Will plan outreach as appropriate to discuss [facility] services. Will continue to follow while member resides in SNF.    [name], MSN-Ed, RN,BSN [place] Acute Care Coordinator [phone] [facility]) [phone]  (Toll free office)

## 2019-09-25 ENCOUNTER — Encounter: Payer: Self-pay | Admitting: Internal Medicine

## 2019-09-28 ENCOUNTER — Telehealth: Payer: Self-pay | Admitting: *Deleted

## 2019-09-28 ENCOUNTER — Other Ambulatory Visit: Payer: Medicare Other

## 2019-09-28 ENCOUNTER — Ambulatory Visit: Payer: Medicare Other | Admitting: Family

## 2019-09-28 NOTE — Telephone Encounter (Signed)
Call received from [place] with [place] to notify Dr. Marin Olp that pt is unable to come to her appt today, d/t increased pain and would like to know if labs can be drawn at [facility].  Dr. Marin Olp notified and order received for pt to have CBC, CMET, Iron/TIBC and Ferritin drawn at Rehab facility.  Orders faxed to [place] at [phone].

## 2019-09-29 ENCOUNTER — Other Ambulatory Visit: Payer: Self-pay | Admitting: Internal Medicine

## 2019-09-29 DIAGNOSIS — M549 Dorsalgia, unspecified: Secondary | ICD-10-CM

## 2019-09-29 DIAGNOSIS — S22070A Wedge compression fracture of T9-T10 vertebra, initial encounter for closed fracture: Secondary | ICD-10-CM

## 2019-09-29 DIAGNOSIS — F341 Dysthymic disorder: Secondary | ICD-10-CM

## 2019-09-29 MED ORDER — HYDROCODONE-ACETAMINOPHEN 10-325 MG PO TABS: 1.0000 | ORAL_TABLET | Freq: Every day | ORAL | 0 refills | Status: DC

## 2019-09-29 MED ORDER — LORAZEPAM 0.5 MG PO TABS: ORAL_TABLET | ORAL | 0 refills | Status: DC

## 2019-09-29 MED ORDER — HYDROCODONE-ACETAMINOPHEN 10-325 MG PO TABS: 1.0000 | ORAL_TABLET | Freq: Four times a day (QID) | ORAL | 0 refills | Status: DC | PRN

## 2019-10-05 ENCOUNTER — Other Ambulatory Visit: Payer: Self-pay | Admitting: *Deleted

## 2019-10-05 ENCOUNTER — Non-Acute Institutional Stay (SKILLED_NURSING_FACILITY): Payer: Medicare Other | Admitting: Internal Medicine

## 2019-10-05 ENCOUNTER — Telehealth: Payer: Self-pay

## 2019-10-05 DIAGNOSIS — I10 Essential (primary) hypertension: Secondary | ICD-10-CM

## 2019-10-05 DIAGNOSIS — S22070A Wedge compression fracture of T9-T10 vertebra, initial encounter for closed fracture: Secondary | ICD-10-CM

## 2019-10-05 DIAGNOSIS — M549 Dorsalgia, unspecified: Secondary | ICD-10-CM

## 2019-10-05 DIAGNOSIS — F341 Dysthymic disorder: Secondary | ICD-10-CM | POA: Diagnosis not present

## 2019-10-05 DIAGNOSIS — K219 Gastro-esophageal reflux disease without esophagitis: Secondary | ICD-10-CM | POA: Diagnosis not present

## 2019-10-05 MED ORDER — LORAZEPAM 1 MG PO TABS: 1.0000 mg | ORAL_TABLET | Freq: Every evening | ORAL | 0 refills | Status: DC | PRN

## 2019-10-05 MED ORDER — HYDROXYUREA 500 MG PO CAPS: 500.0000 mg | ORAL_CAPSULE | ORAL | 0 refills | Status: DC

## 2019-10-05 MED ORDER — METOPROLOL TARTRATE 50 MG PO TABS: 50.0000 mg | ORAL_TABLET | Freq: Two times a day (BID) | ORAL | 0 refills | Status: DC

## 2019-10-05 MED ORDER — HYDROCODONE-ACETAMINOPHEN 10-325 MG PO TABS: 1.0000 | ORAL_TABLET | Freq: Two times a day (BID) | ORAL | 0 refills | Status: DC | PRN

## 2019-10-05 MED ORDER — TIZANIDINE HCL 2 MG PO CAPS: 2.0000 mg | ORAL_CAPSULE | Freq: Three times a day (TID) | ORAL | 0 refills | Status: DC | PRN

## 2019-10-05 MED ORDER — LIDOCAINE 4 % EX PTCH: MEDICATED_PATCH | CUTANEOUS | 0 refills | Status: DC

## 2019-10-05 MED ORDER — OMEPRAZOLE 20 MG PO CPDR: 20.0000 mg | DELAYED_RELEASE_CAPSULE | Freq: Every day | ORAL | 0 refills | Status: DC

## 2019-10-05 MED ORDER — ONDANSETRON HCL 4 MG PO TABS: 4.0000 mg | ORAL_TABLET | Freq: Four times a day (QID) | ORAL | 0 refills | Status: DC | PRN

## 2019-10-05 MED ORDER — LORAZEPAM 0.5 MG PO TABS: ORAL_TABLET | ORAL | 0 refills | Status: DC

## 2019-10-05 NOTE — Patient Outreach (Signed)
[facility] Post- Acute Care Coordinator follow up  Spoke with [place] who indicates member will have [facility].  Telephone call back to daughter/ [name] [phone]. States member is home and is doing well. Early Chars states she is on the way to Mrs. Klonowski's and Probation officer should call member at home.   Telephone call to Mrs. Jenean Lindau [phone]. Patient identifiers confirmed.  Explained [place] Management services. Mrs. Jenean Lindau agreeable. Declines home visits with [facility].   Confirmed best contact for member as [phone].   Will make referral to [place] for care coordination.   Member has medical history of CHF, HTN, GERD, compression fracture.   [name], MSN-Ed, RN,BSN [place] Acute Care Coordinator [phone] [facility]) [phone]  (Toll free office)

## 2019-10-05 NOTE — Telephone Encounter (Signed)
Need Dr. Ethel Rana VO for them to do PT & OT for Patient.  Call back # [phone].

## 2019-10-05 NOTE — Progress Notes (Signed)
Location:    [facility] Room Number: 111/P Place of Service:  SNF [phone])  Provider: Lorne Skeens  PCP: Colon Branch, MD Patient Care Team: Colon Branch, MD as PCP - [facility] [place], Holli Humbles, NP as Nurse Practitioner (Nurse Practitioner) Volanda Napoleon, MD as Consulting Physician (Oncology) Melrose Nakayama, MD as Consulting Physician (Orthopedic Surgery)  Extended Emergency Contact Information Primary Emergency Contact: Hunter,[name] Address: [address]          [place], [place] [address] [place] Phone: [phone] Mobile Phone: [phone] Relation: Daughter Secondary Emergency Contact: Andrew Au Home Phone: [phone] Relation: Grandson  Code Status: Full Code Goals of care:  Advanced Directive information Advanced Directives 10/05/2019  Does Patient Have a Medical Advance Directive? Yes  Type of Advance Directive (No Data)  Does patient want to make changes to medical advance directive? No - Patient declined  Would patient like information on creating a medical advance directive? -  Pre-existing out of facility DNR order (yellow form or pink MOST form) -     Allergies  Allergen Reactions  . Penicillins Hives, Rash and Other (See Comments)    Has patient had a PCN reaction causing immediate rash, facial/tongue/throat swelling, SOB or lightheadedness with hypotension: Yes Has patient had a PCN reaction causing severe rash involving mucus membranes or skin necrosis: yes Has patient had a PCN reaction that required hospitalization: No Has patient had a PCN reaction occurring within the last 10 years: no If all of the above answers are "NO", then may proceed with Cephalosporin use.  Other Reaction: OTHER REACTION  . Valsartan Itching and Other (See Comments)  . Atorvastatin Diarrhea  . Carvedilol Other (See Comments)    "teeth hurt when I took it"   . Ezetimibe Nausea Only  . Fluvastatin Sodium Other (See  Comments)    "Can't remember"  . Magnesium Hydroxide Other (See Comments)    REACTION: triggers HAs  . Meloxicam Other (See Comments)    Pt seeing auro's / spots.    . Pneumovax [Pneumococcal Polysaccharide Vaccine] Other (See Comments)    Local reaction  . Quinapril Hcl Other (See Comments)     "feelings of tiredness"  . Simvastatin Diarrhea  . Topamax [Topiramate] Itching  . Vit D-Vit E-Safflower Oil Other (See Comments)    Headaches    Chief Complaint  Patient presents with  . Discharge Note    Discharge Visit    HPI:  86 y.o. female seen today for discharge from facility-she will be going home today.  She has been here for rehab after hospitalization for a T9 compression fracture that caused severe back pain and difficulty walking.  She did have a balloon kyphoplasty on Sep 15, 2019.  And appears to have gained strength and improved with rehab here.  She also has a history of polycythemia she is on Hydrea 500 mg every other day-and is followed by hematology oncology.  Her platelet count on most recent lab was 363,000 the lab was done on May 17.  She has received phlebotomy in the past and this will be followed by hematology oncology-Dr. Marin Olp.  She also has a history of anxiety and continues on Ativan 0.25 mg twice daily during a day and receives 1 mg as needed at night.  Apparently she has been receiving Ativan long-term.  For pain she is continues on Norco 10 325 mg a day she takes this about twice a day will give  her a short supply when she goes home but will need expedient follow-up by primary care provider for any refills.  She also has a history of hypertension she is on Lopressor 50 mg twice daily blood pressure today is 106/62.  Currently she is lying in bed comfortably she does use her walker-and she is quite adamant that she is ready to go home.  She will need continued PT and OT as well as expedient follow-up by primary care provider and hematology  oncology-    Past Medical History:  Diagnosis Date  . CAD (coronary artery disease)    mild nonobstructive disease on cath in 2003  . Cancer ([facility])   . Cardiomyopathy    Probable Takotsubo, severe CP w/ normal cath in 1994. Severe CP in 2003 w/ widespread T wave inversions on ECG. Cath w/ minimal coronary disease but LV-gram showed periapical severe hypokinesis and basilar hyperkinesia (EF 40%). Last echo in 4/09 confirmed full LV functional recovery with EF 60%, no regional wall motion abnormalities, mild to moderate LVH.  Marland Kitchen Chronic diastolic CHF (congestive heart failure) ([facility]) 09/13/2019  . CVA (cerebral infarction)   . Depression with anxiety 03-29-11   lost husband 3'09  . Diverticulitis   . DVT (deep venous thrombosis) ([facility])    after venous ablation, R leg  . E. coli gastroenteritis 03-29-11   8'10  . Fracture 09/10/07   L2, status post vertebroplasty of L2 performed by IR  . GERD (gastroesophageal reflux disease)   . Headache(784.0)    migraines  . Hemorrhoids   . Hiatal hernia 03-29-11   no nerve problems  . Hyperlipidemia   . Hyperplastic colon polyp 06/2001  . Hypertension   . Iron deficiency anemia due to chronic blood loss 12/30/2017  . OA (osteoarthritis) of knee 03-29-11   w/ bilateral knee pain-not a problem now  . OSA (obstructive sleep apnea) 03-29-11   no cpap used, not a problem now.  . Osteoporosis   . Otalgia of both ears    Dr. Simeon Craft  . Polycythemia   . Stroke [facility]) 03-29-11   CVA x2 -last 10'12-?TIA(visual problems)  . Varicose vein     Past Surgical History:  Procedure Laterality Date  . APPENDECTOMY    . BACK SURGERY    . BREAST BIOPSY    . BREAST CYST EXCISION    . BREAST LUMPECTOMY Left 2013   left stage I left breast cancer  . CHOLECYSTECTOMY  04/09/2011   Procedure: LAPAROSCOPIC CHOLECYSTECTOMY WITH INTRAOPERATIVE CHOLANGIOGRAM;  Surgeon: Odis Hollingshead, MD;  Location: [facility] ORS;  Service: General;  Laterality: N/A;  . Dental Extraction      L maxillary molar  . IR KYPHO THORACIC WITH BONE BIOPSY  09/15/2019  . KYPHOSIS SURGERY  08/2007   cement used  . OVARIAN CYST SURGERY     left  . TONSILLECTOMY    . TOTAL KNEE ARTHROPLASTY  06/2010   left  . TUBAL LIGATION        reports that she quit smoking about 31 years ago. Her smoking use included cigarettes. She started smoking about 68 years ago. She has a 37.00 pack-year smoking history. She has never used smokeless tobacco. She reports current alcohol use of about 1.0 standard drinks of alcohol per week. She reports that she does not use drugs. Social History   Socioeconomic History  . Marital status: Widowed    Spouse name: Not on file  . Number of children: 3  . Years of  education: Not on file  . Highest education level: Not on file  Occupational History  . Occupation: n/a  Tobacco Use  . Smoking status: Former Smoker    Packs/day: 1.00    Years: 37.00    Pack years: 37.00    Types: Cigarettes    Start date: 06/26/1949    Quit date: 05/14/1988    Years since quitting: 31.4  . Smokeless tobacco: Never Used  . Tobacco comment: quit 25 years ago  Substance and Sexual Activity  . Alcohol use: Yes    Alcohol/week: 1.0 standard drinks    Types: 1 Glasses of wine per week    Comment: occasional/social  . Drug use: No  . Sexual activity: Not Currently  Other Topics Concern  . Not on file  Social History Narrative   Lost husband 07/29/07-    Lives in [place] in a town house by herself, has 3 daughters, lost Mickel Baas [phone])   [place] lives in [place], [place] ( [place])     Still drives             Social Determinants of Health   Financial Resource Strain:   . Difficulty of Paying Living Expenses:   Food Insecurity:   . Worried About Charity fundraiser in the Last Year:   . Arboriculturist in the Last Year:   Transportation Needs:   . Film/video editor (Medical):   Marland Kitchen Lack of Transportation (Non-Medical):   Physical Activity:   . Days of Exercise  per Week:   . Minutes of Exercise per Session:   Stress:   . Feeling of Stress :   Social Connections:   . Frequency of Communication with Friends and Family:   . Frequency of Social Gatherings with Friends and Family:   . Attends Religious Services:   . Active Member of Clubs or Organizations:   . Attends Archivist Meetings:   Marland Kitchen Marital Status:   Intimate Partner Violence:   . Fear of Current or Ex-Partner:   . Emotionally Abused:   Marland Kitchen Physically Abused:   . Sexually Abused:    Functional Status Survey:    Allergies  Allergen Reactions  . Penicillins Hives, Rash and Other (See Comments)    Has patient had a PCN reaction causing immediate rash, facial/tongue/throat swelling, SOB or lightheadedness with hypotension: Yes Has patient had a PCN reaction causing severe rash involving mucus membranes or skin necrosis: yes Has patient had a PCN reaction that required hospitalization: No Has patient had a PCN reaction occurring within the last 10 years: no If all of the above answers are "NO", then may proceed with Cephalosporin use.  Other Reaction: OTHER REACTION  . Valsartan Itching and Other (See Comments)  . Atorvastatin Diarrhea  . Carvedilol Other (See Comments)    "teeth hurt when I took it"   . Ezetimibe Nausea Only  . Fluvastatin Sodium Other (See Comments)    "Can't remember"  . Magnesium Hydroxide Other (See Comments)    REACTION: triggers HAs  . Meloxicam Other (See Comments)    Pt seeing auro's / spots.    . Pneumovax [Pneumococcal Polysaccharide Vaccine] Other (See Comments)    Local reaction  . Quinapril Hcl Other (See Comments)     "feelings of tiredness"  . Simvastatin Diarrhea  . Topamax [Topiramate] Itching  . Vit D-Vit E-Safflower Oil Other (See Comments)    Headaches    Pertinent  Health Maintenance Due  Topic  Date Due  . INFLUENZA VACCINE  12/13/2019  . DEXA SCAN  02/27/2022  . PNA vac Low Risk Adult  Completed     Medications: Allergies as of 10/05/2019      Reactions   Penicillins Hives, Rash, Other (See Comments)   Has patient had a PCN reaction causing immediate rash, facial/tongue/throat swelling, SOB or lightheadedness with hypotension: Yes Has patient had a PCN reaction causing severe rash involving mucus membranes or skin necrosis: yes Has patient had a PCN reaction that required hospitalization: No Has patient had a PCN reaction occurring within the last 10 years: no If all of the above answers are "NO", then may proceed with Cephalosporin use. Other Reaction: OTHER REACTION   Valsartan Itching, Other (See Comments)   Atorvastatin Diarrhea   Carvedilol Other (See Comments)   "teeth hurt when I took it"   Ezetimibe Nausea Only   Fluvastatin Sodium Other (See Comments)   "Can't remember"   Magnesium Hydroxide Other (See Comments)   REACTION: triggers HAs   Meloxicam Other (See Comments)   Pt seeing auro's / spots.     Pneumovax [pneumococcal Polysaccharide Vaccine] Other (See Comments)   Local reaction   Quinapril Hcl Other (See Comments)    "feelings of tiredness"   Simvastatin Diarrhea   Topamax [topiramate] Itching   Vit D-vit E-safflower Oil Other (See Comments)   Headaches      Medication List       Accurate as of Oct 05, 2019  9:59 AM. If you have any questions, ask your nurse or doctor.        acetaminophen 650 MG CR tablet Commonly known as: TYLENOL Take 650 mg by mouth daily as needed for pain.   aspirin 81 MG tablet Take 81 mg by mouth daily.   bisacodyl 10 MG suppository Commonly known as: DULCOLAX Place 1 suppository (10 mg total) rectally daily as needed for moderate constipation.   bismuth subsalicylate [id number] suspension Commonly known as: PEPTO BISMOL Take 30 mLs by mouth daily as needed for indigestion or diarrhea or loose stools.   BOOST BREEZE PO Take by mouth. Boost Breeze qd d/t poor appetite.   docusate sodium 100 MG capsule Commonly  known as: COLACE Take 100 mg by mouth 2 (two) times daily.   HYDROcodone-acetaminophen 10-325 MG tablet Commonly known as: NORCO Take 1 tablet by mouth every 6 (six) hours as needed for up to 14 days for severe pain. What changed: Another medication with the same name was removed. Continue taking this medication, and follow the directions you see here.   hydroxyurea 500 MG capsule Commonly known as: HYDREA Take 500 mg by mouth every other day. May take with food to minimize GI side effects.   Lidocaine 4 % Ptch LIDOCAINE 4% PATCH :Apply 1 patch topically daily and remove after 12 hours   LORazepam 1 MG tablet Commonly known as: ATIVAN Take 1 mg by mouth at bedtime as needed for anxiety.   LORazepam 0.5 MG tablet Commonly known as: ATIVAN TAKE 1/2 TABLET BY MOUTH EVERY MORNING AND EVERY AFTERNOON. THEN TAKE 2 TABLETS BY MOUTH AT BEDTIME AS NEEDED   metoprolol tartrate 50 MG tablet Commonly known as: LOPRESSOR Take 1 tablet (50 mg total) by mouth 2 (two) times daily.   NON FORMULARY DIET :Regular NAS Heart heallthy   omeprazole 20 MG capsule Commonly known as: PRILOSEC Take 20 mg by mouth daily.   ondansetron 4 MG tablet Commonly known as: ZOFRAN Take 4 mg  by mouth every 6 (six) hours as needed for nausea or vomiting.   polyethylene glycol 17 g packet Commonly known as: MIRALAX / GLYCOLAX Take 17 g by mouth daily.   tizanidine 2 MG capsule Commonly known as: Zanaflex Take 1 capsule (2 mg total) by mouth 3 (three) times daily as needed for muscle spasms.   valACYclovir 500 MG tablet Commonly known as: VALTREX Take 2 tablets at onset of rash and 2 tablets 12 hours later as needed.       Review of Systems   In general she is not complaining of any fever or chills.  Skin does not complain of any itching rashes or diaphoresis.-At one point she complained of a small abdominal bruise but this appears to have resolved  Head ears eyes nose mouth and throat is not  complaining of visual changes or sore throat.  Respiratory does not complain of being short of breath or having a cough.  Cardiac does not complain of chest pain or increasing edema.  GI does not complain of abdominal discomfort nausea vomiting diarrhea constipation.  GU no complaints of dysuria.  Musculoskeletal continues to have back pain but apparently Norco twice a day is effective she also has a lidoderm  patch and Tylenol as needed.  In addition Zanaflex as needed  Neurologic is not complain of dizziness headache or numbness at this point.  And psych does not complain of being depressed or anxious at this point does have a history of anxiety as noted above-she is very much looking forward to going home  Vitals:   10/05/19 0941  BP: 106/62  Pulse: 68  Resp: 17  Temp: (!) 97.1 F (36.2 C)  TempSrc: Oral  SpO2: 92%  Weight: 152 lb 6.4 oz (69.1 kg)  Height: 5\' 6"  (1.676 m)   Body mass index is 24.6 kg/m. Physical Exam   In general this is a pleasant elderly female in no distress.  Her skin is warm and dry.  Eyes visual acuity appears to be intact sclera and conjunctive are clear.  Oropharynx clear mucous membranes moist.  Chest is clear to auscultation there is no labored breathing.  Heart is regular rate and rhythm without murmur gallop or rub she does not have significant lower extremity edema.  Abdomen is soft nontender with positive bowel sounds.  Musculoskeletal is able to move all extremities x4 it appears at relative baseline-she is able to ambulate with a walker-she does have a pain patch on her lower back.  Neurologic appears grossly intact cannot appreciate lateralizing findings her speech is clear  Psych she is grossly alert and oriented pleasant and appropriate.    Labs reviewed:  Sep 28, 2019.  WBC 3.9 hemoglobin 14.6 platelets 363.  Sodium 137 potassium 3.9 BUN 4.5 creatinine 0.62.  Sep 21, 2019.  WBC 5.6 hemoglobin 15.2 platelets  505.  Sodium 134 potassium 4.6 BUN 6.2 creatinine 0.6 Basic Metabolic Panel: Recent Labs    09/14/19 0424 09/15/19 0254 09/16/19 0311  NA 133* 133* 135  K 4.1 4.2 3.9  CL 96* 96* 98  CO2 29 29 30   GLUCOSE 106* 101* 98  BUN 9 12 12   CREATININE 0.64 0.49 0.52  CALCIUM 9.4 9.3 9.2  MG 2.1 2.1  --    Liver Function Tests: Recent Labs    09/04/19 1903 09/14/19 0424 09/15/19 0254  AST 21 28 24   ALT 14 19 18   ALKPHOS 60 67 64  BILITOT 1.4* 1.7* 1.4*  PROT 6.6 6.6  6.4*  ALBUMIN 3.9 3.9 3.6   No results for input(s): LIPASE, AMYLASE in the last 8760 hours. No results for input(s): AMMONIA in the last 8760 hours. CBC: Recent Labs    09/04/19 1903 09/04/19 1903 09/12/19 2359 09/12/19 2359 09/14/19 0424 09/15/19 0254 09/16/19 0311  WBC 10.1   < > 6.6   < > 5.3 6.9 6.3  NEUTROABS 8.4*  --  5.7  --  3.5  --   --   HGB 14.8   < > 14.6   < > 14.7 14.6 14.9  HCT 49.3*   < > 47.4*   < > 48.1* 47.9* 47.1*  MCV 88.4   < > 86.0   < > 87.1 89.2 87.7  PLT 470*   < > 492*   < > 522* 508* 451*   < > = values in this interval not displayed.   Cardiac Enzymes: No results for input(s): CKTOTAL, CKMB, CKMBINDEX, TROPONINI in the last 8760 hours. BNP: Invalid input(s): POCBNP CBG: No results for input(s): GLUCAP in the last 8760 hours.  Procedures and Imaging Studies During Stay: MR THORACIC SPINE WO CONTRAST  Result Date: 09/14/2019 CLINICAL DATA:  Fall. T9 compression fracture. EXAM: MRI THORACIC SPINE WITHOUT CONTRAST TECHNIQUE: Multiplanar, multisequence MR imaging of the thoracic spine was performed. No intravenous contrast was administered. COMPARISON:  CTA chest, abdomen, and pelvis 09/13/2019 FINDINGS: Alignment:  Normal. Vertebrae: T9 compression fracture with extensive marrow edema and 30% vertebral body height loss without retropulsion. Moderate marrow edema in the opposing T6 and T7 vertebral bodies without height loss, favored to be degenerative with Modic type 2  degenerative endplate changes also present at this level. Remote L1 compression fracture status post augmentation. Nonspecific diffuse bone marrow heterogeneity. Cord:  Normal signal and morphology. Paraspinal and other soft tissues: Moderately large sliding hiatal hernia. Trace right pleural effusion. Disc levels: Mild disc bulging primarily in the mid to upper thoracic spine and mild thoracic facet arthrosis without spinal stenosis or spinal cord mass effect. Mild right and moderate left neural foraminal stenosis at T9-10. IMPRESSION: 1. Acute T9 compression fracture with 30% height loss. 2. Mild thoracic spondylosis and facet arthrosis without spinal stenosis. Electronically Signed   By: Logan Bores M.D.   On: 09/14/2019 14:58   IR KYPHO THORACIC WITH BONE BIOPSY  Result Date: 09/15/2019 CLINICAL DATA:  86 year old female with acutely symptomatic T9 vertebral compression fracture. She presents for cement augmentation with kyphoplasty. EXAM: FLUOROSCOPIC GUIDED KYPHOPLASTY OF THE T9 VERTEBRAL BODY COMPARISON:  Thoracic spine MRI 09/14/2019 MEDICATIONS: As antibiotic prophylaxis, 900 mg clindamycin was ordered pre-procedure and administered intravenously within 1 hour of incision. ANESTHESIA/SEDATION: Moderate (conscious) sedation was employed during this procedure. A total of Versed 4 mg and Fentanyl 100 mcg was administered intravenously. Moderate Sedation Time: 41 minutes. The patient's level of consciousness and vital signs were monitored continuously by radiology nursing throughout the procedure under my direct supervision. FLUOROSCOPY TIME:  8 minutes, 54 seconds ([id number] mGy) COMPLICATIONS: None immediate. TECHNIQUE: The procedure, risks (including but not limited to bleeding, infection, organ damage), benefits, and alternatives were explained to the patient. Questions regarding the procedure were encouraged and answered. The patient understands and consents to the procedure. The patient was placed prone on  the fluoroscopic table. The skin overlying the upper thoracic region was then prepped and draped in the usual sterile fashion. Maximal barrier sterile technique was utilized including caps, mask, sterile gowns, sterile gloves, sterile drape, hand hygiene and skin antiseptic. Intravenous Fentanyl  and Versed were administered as conscious sedation during continuous cardiorespiratory monitoring by the radiology RN. The left pedicle at T9 was then infiltrated with 1% lidocaine followed by the advancement of a Kyphon trocar needle through the left pedicle into the posterior one-third of the vertebral body. Subsequently, the osteo drill was advanced to the anterior third of the vertebral body. The osteo drill was retracted. Through the working cannula, a Kyphon inflatable bone tamp 15 x 2 was advanced and positioned with the distal marker approximately 5 mm from the anterior aspect of the cortex. Appropriate positioning was confirmed on the AP projection. At this time, the balloon was expanded using contrast via a Kyphon inflation syringe device via micro tubing. In similar fashion, the right T9 pedicle was infiltrated with 1% lidocaine followed by the advancement of a second Kyphon trocar needle through the right pedicle into the posterior third of the vertebral body. Subsequently, the osteo drill was coaxially advanced to the anterior right third. The osteo drill was exchanged for a Kyphon inflatable bone tamp 15 x 2, advanced to the 5 mm of the anterior aspect of the cortex. The balloon was then expanded using contrast as above. Inflations were continued until there was near apposition with the superior end plate. At this time, methylmethacrylate mixture was reconstituted in the Kyphon bone mixing device system. This was then loaded into the delivery mechanism, attached to Kyphon bone fillers. The balloons were deflated and removed followed by the instillation of methylmethacrylate mixture with excellent filling in the  AP and lateral projections. No extravasation was noted in the disk spaces or posteriorly into the spinal canal. No epidural venous contamination was seen. The working cannulae and the bone filler were then retrieved and removed. Hemostasis was achieved with manual compression. The patient tolerated the procedure well without immediate postprocedural complication. IMPRESSION: 1. Technically successful T9 vertebral body augmentation using balloon kyphoplasty. 2. Per CMS PQRS reporting requirements (PQRS Measure 24): Given the patient's age of greater than 54 and the fracture site (hip, distal radius, or spine), the patient should be tested for osteoporosis using DXA, and the appropriate treatment considered based on the DXA results. Electronically Signed   By: Jacqulynn Cadet M.D.   On: 09/15/2019 17:08   CT Angio Chest/Abd/Pel for Dissection W and/or Wo Contrast  Result Date: 09/13/2019 CLINICAL DATA:  Left flank pain. Low back pain. Concern for acute aortic syndrome. EXAM: CT ANGIOGRAPHY CHEST, ABDOMEN AND PELVIS TECHNIQUE: Non-contrast CT of the chest was initially obtained. Multidetector CT imaging through the chest, abdomen and pelvis was performed using the standard protocol during bolus administration of intravenous contrast. Multiplanar reconstructed images and MIPs were obtained and reviewed to evaluate the vascular anatomy. CONTRAST:  185mL OMNIPAQUE IOHEXOL 350 MG/ML SOLN COMPARISON:  218010 CT abdomen/pelvis. 06/24/2018 chest radiograph. 01/20/2015 chest CT angiogram. FINDINGS: CTA CHEST FINDINGS Cardiovascular: Top-normal heart size. No significant pericardial effusion/thickening. Three-vessel coronary atherosclerosis. Atherosclerotic nonaneurysmal thoracic aorta. No acute intramural hematoma, dissection, pseudoaneurysm or penetrating atherosclerotic ulcer in the thoracic aorta. Aortic arch branch vessels are patent. Normal caliber pulmonary arteries. No central pulmonary emboli. Mediastinum/Nodes:  No discrete thyroid nodules. Unremarkable esophagus. No pathologically enlarged axillary, mediastinal or hilar lymph nodes. Coarsely calcified granulomatous subcarinal and right hilar nodes. Lungs/Pleura: No pneumothorax. No pleural effusion. Mild compressive atelectasis in the medial lower lobes from the hiatal hernia. No acute consolidative airspace disease, lung masses or significant pulmonary nodules. Musculoskeletal: No aggressive appearing focal osseous lesions. Diffuse osteopenia. Mild to moderate T9 vertebral compression  fracture, new since 01/20/2015 CT. Moderate thoracic spondylosis. Review of the MIP images confirms the above findings. CTA ABDOMEN AND PELVIS FINDINGS VASCULAR Aorta: Atherosclerotic nonaneurysmal abdominal aorta with no abdominal aortic dissection. Celiac: Moderate (approximately 50-60%) celiac atherosclerotic stenosis at the origin. SMA: Patent without evidence of aneurysm, dissection, vasculitis or significant stenosis. Renals: Both renal arteries are patent without evidence of aneurysm, dissection, vasculitis, fibromuscular dysplasia or significant stenosis. IMA: Probable moderate ostial stenosis by atherosclerotic plaque. Inflow: Patent without evidence of aneurysm, dissection, vasculitis or significant stenosis. Veins: No obvious venous abnormality within the limitations of this arterial phase study. Review of the MIP images confirms the above findings. NON-VASCULAR Hepatobiliary: Normal liver size. Scattered tiny liver granulomatous calcifications. No liver masses. Cholecystectomy. Bile ducts are stable and within normal post cholecystectomy limits with CBD diameter 9 mm. Pancreas: Normal, with no mass or duct dilation. Spleen: Normal size spleen. Scattered granulomatous splenic calcifications. No splenic masses. Adrenals/Urinary Tract: Normal adrenals. No renal stones. No hydronephrosis. No contour deforming renal masses. Normal bladder. Stomach/Bowel: Moderate hiatal hernia.  Otherwise normal nondistended stomach. Normal caliber small bowel with no small bowel wall thickening. Appendectomy. Mild sigmoid diverticulosis with no large bowel wall thickening or significant pericolonic fat stranding. Vascular/Lymphatic: No pathologically enlarged lymph nodes in the abdomen or pelvis. Reproductive: Stable coarsely calcified 1.3 cm left uterine fibroid. No adnexal masses. Other: No pneumoperitoneum, ascites or focal fluid collection. Musculoskeletal: No aggressive appearing focal osseous lesions. Chronic moderate L2 vertebral compression fracture status post vertebroplasty. Chronic moderate L3 and L4 vertebral compression fractures are unchanged. Diffuse osteopenia. Moderate lumbar spondylosis. Review of the MIP images confirms the above findings. IMPRESSION: 1. No acute aortic syndrome. 2. Mild to moderate T9 vertebral compression fracture, new since [id number] CT, of uncertain chronicity, possibly acute. Chronic L2, L3 and L4 vertebral compression fractures. 3. Three-vessel coronary atherosclerosis. 4. Moderate hiatal hernia. 5. Mild sigmoid diverticulosis. 6. Aortic Atherosclerosis (ICD10-I70.0). Additional chronic findings as detailed. Electronically Signed   By: Ilona Sorrel M.D.   On: 09/13/2019 05:03    Assessment/Plan:    #1 history of T9 compression fracture status post kyphoplasty  Per nursing she is requesting her Norco-nursing staff does note she is having some pain and appears to achieve relief with the Norco and appears to be well-tolerated -she can have it up to 4  times a day but apparently 2 times a day appears to be adequate-we will give her a limited supply 1 week supply of Norco 10 325 mg p.o. twice daily as needed..  I did discuss this with her daughter via phone-and she is in agreement with the plan-she would like her mother to be off narcotics as soon as possible and so we will give a very limited supply--will need follow-up with primary care provider for any  refills.  She also apparently is receiving relief from her muscle relaxer Zanaflex 2 mg she can have up to 3 times a day-we will give her a limited supply of this as well-- she also has a lidocaine patch.    Also will need continued PT OT.  2.  History of polycythemia she is on Hydrea 500 mg every other day-last platelet count was 363,000 which appears to be down from previous level-she will have follow-up with Dr. Marin Olp of hematology oncology-she has needed phlebotomy in the past-.  3.  History of anxiety-she has required Ativan during the day low-dose 0.25 mg twice daily-she also has requested 1 mg as needed nightly and she takes this fairly  frequently per nursing.  We will give her a limited supply of this as well with no refills.  4.  History of hypertension this appears to be stable with blood pressure today 106/62 she is on Lopressor 50 mg twice daily.  5.  History of GERD this appears stable as well she is on Prilosec 20 mg a day.  6.  History of osteoporosis she is on calcium through May 30 this will need follow-up by primary care provider.  Nausea she does have Zofran as needed we will give her a limited supply of this as well.  Again she will need continued PT and OT as well as expedient primary care follow-up-   [id number] note greater than 30 minutes spent on this discharge summary-greater than 50% of time spent coordinating and formulating a plan of care for numerous diagnoses :    Patient has been advised to f/u with their PCP in 1-2 weeks to bring them up to date on their rehab stay.  Social services at facility was responsible for arranging this appointment.  Pt was provided with a 30 day supply of prescriptions for medications and refills must be obtained from their PCP.  For controlled substances, a more limited supply may be provided adequate until PCP appointment only.

## 2019-10-05 NOTE — Patient Outreach (Signed)
[facility] Post- Acute Care Coordinator follow up  Telephone call made to [facility] (daughter/DPR) [phone]. Patient identifiers confirmed.  Attempted to discuss [employer] services and possible [facility] due to member living alone. However, Early Chars asked that Probation officer provide contact number for her to call back. States "I need to see what she is going to need." States the plan was not to come home today. However, Early Chars states Mrs. Jelley demanded to go home today.   Will plan to call Early Chars back later if no return call.   [name], MSN-Ed, RN,BSN [place] Acute Care Coordinator [phone] [facility]) [phone]  (Toll free office)

## 2019-10-05 NOTE — Telephone Encounter (Signed)
Spoke w/ Tania- verbal orders given.

## 2019-10-06 ENCOUNTER — Other Ambulatory Visit: Payer: Self-pay

## 2019-10-06 ENCOUNTER — Encounter: Payer: Self-pay | Admitting: Internal Medicine

## 2019-10-06 NOTE — Patient Outreach (Signed)
[facility]) Care Management  10/06/2019  [name] 19-Oct-1929 [id number]   Transition of care: Discharge from [facility] SNF Reason: compression fracture of T9. S/p kyphoplasty.   Placed call to patient with no answer. No voicemail set up.   PLAN:  Will place 2nd call in 3 days and send an outreach letter.  [name], RN, BSN, CEN [facility] [employer] [phone]

## 2019-10-08 ENCOUNTER — Ambulatory Visit (INDEPENDENT_AMBULATORY_CARE_PROVIDER_SITE_OTHER): Payer: Medicare Other | Admitting: Internal Medicine

## 2019-10-08 ENCOUNTER — Other Ambulatory Visit: Payer: Medicare Other

## 2019-10-08 ENCOUNTER — Ambulatory Visit: Payer: Medicare Other | Admitting: Family

## 2019-10-08 ENCOUNTER — Encounter: Payer: Self-pay | Admitting: Internal Medicine

## 2019-10-08 ENCOUNTER — Other Ambulatory Visit: Payer: Self-pay

## 2019-10-08 VITALS — BP 134/65 | HR 64 | Temp 97.4°F | Resp 18 | Ht 66.0 in | Wt 151.5 lb

## 2019-10-08 DIAGNOSIS — D751 Secondary polycythemia: Secondary | ICD-10-CM

## 2019-10-08 DIAGNOSIS — S22070D Wedge compression fracture of T9-T10 vertebra, subsequent encounter for fracture with routine healing: Secondary | ICD-10-CM | POA: Diagnosis not present

## 2019-10-08 DIAGNOSIS — I10 Essential (primary) hypertension: Secondary | ICD-10-CM

## 2019-10-08 DIAGNOSIS — M8949 Other hypertrophic osteoarthropathy, multiple sites: Secondary | ICD-10-CM | POA: Diagnosis not present

## 2019-10-08 DIAGNOSIS — F341 Dysthymic disorder: Secondary | ICD-10-CM

## 2019-10-08 DIAGNOSIS — M159 Polyosteoarthritis, unspecified: Secondary | ICD-10-CM

## 2019-10-08 MED ORDER — HYDROCODONE-ACETAMINOPHEN 5-325 MG PO TABS: 1.0000 | ORAL_TABLET | Freq: Two times a day (BID) | ORAL | 0 refills | Status: DC | PRN

## 2019-10-08 MED ORDER — LORAZEPAM 0.5 MG PO TABS: ORAL_TABLET | ORAL | 0 refills | Status: DC

## 2019-10-08 NOTE — Progress Notes (Signed)
Pre visit review using our clinic review tool, if applicable. No additional management support is needed unless otherwise documented below in the visit note. 

## 2019-10-08 NOTE — Patient Instructions (Addendum)
Please schedule Medicare Wellness with Glenard Haring.   Stop Tylenol # 3  For pain only take hydrocodone twice a day as needed if the pain is severe. If the pain is not severe: Take only Tylenol  Do not take the water pill anymore for blood pressure, discontinue metoprolol.  Take vitamin D supplements daily  Please consider treatment for osteoporosis, Prolia would be a very good option.  Check the  blood pressure 2 or 3 times a week BP GOAL is between 110/65 and  135/85. If it is consistently higher or lower, let me know    GO TO THE FRONT DESK, PLEASE SCHEDULE YOUR APPOINTMENTS Come back for a check up in 3 months

## 2019-10-08 NOTE — Progress Notes (Signed)
Subjective:    Patient ID: [name], female    DOB: Aug 03, 1929, 86 y.o.   MRN: [id number]  DOS:  10/08/2019 Type of visit - description: Admitted to the hospital 09/12/2019, discharge to a nursing facility 5 days later. She is now at home.  Admitted to the hospital with severe back pain, CT angiogram of the chest abdomen and pelvis show a new T9 compression fracture thought to be the cause of the pain. S/p kyphoplasty 09/15/2019, it helped the pain. Other problems are stable. They recommended a BMP, CBC and Mg on on follow-up     Review of Systems Since she left the nursing home she is doing okay. Pain is well controlled with occasional hydrocodone. Reports poor appetite and occasional nausea, worse with food.  Denies abdominal pain. No vomiting, diarrhea.   Past Medical History:  Diagnosis Date  . CAD (coronary artery disease)    mild nonobstructive disease on cath in 2003  . Cancer ([place])   . Cardiomyopathy    Probable Takotsubo, severe CP w/ normal cath in 1994. Severe CP in 2003 w/ widespread T wave inversions on ECG. Cath w/ minimal coronary disease but LV-gram showed periapical severe hypokinesis and basilar hyperkinesia (EF 40%). Last echo in 4/09 confirmed full LV functional recovery with EF 60%, no regional wall motion abnormalities, mild to moderate LVH.  Marland Kitchen Chronic diastolic CHF (congestive heart failure) ([place]) 09/13/2019  . CVA (cerebral infarction)   . Depression with anxiety 03-29-11   lost husband 3'09  . Diverticulitis   . DVT (deep venous thrombosis) ([place])    after venous ablation, R leg  . E. coli gastroenteritis 03-29-11   8'10  . Fracture 09/10/07   L2, status post vertebroplasty of L2 performed by IR  . GERD (gastroesophageal reflux disease)   . Headache(784.0)    migraines  . Hemorrhoids   . Hiatal hernia 03-29-11   no nerve problems  . Hyperlipidemia   . Hyperplastic colon polyp 06/2001  . Hypertension   . Iron deficiency anemia due to  chronic blood loss 12/30/2017  . OA (osteoarthritis) of knee 03-29-11   w/ bilateral knee pain-not a problem now  . OSA (obstructive sleep apnea) 03-29-11   no cpap used, not a problem now.  . Osteoporosis   . Otalgia of both ears    Dr. Simeon Craft  . Polycythemia   . Stroke [facility]) 03-29-11   CVA x2 -last 10'12-?TIA(visual problems)  . Varicose vein     Past Surgical History:  Procedure Laterality Date  . APPENDECTOMY    . BACK SURGERY    . BREAST BIOPSY    . BREAST CYST EXCISION    . BREAST LUMPECTOMY Left 2013   left stage I left breast cancer  . CHOLECYSTECTOMY  04/09/2011   Procedure: LAPAROSCOPIC CHOLECYSTECTOMY WITH INTRAOPERATIVE CHOLANGIOGRAM;  Surgeon: Odis Hollingshead, MD;  Location: [facility] ORS;  Service: General;  Laterality: N/A;  . Dental Extraction     L maxillary molar  . IR KYPHO THORACIC WITH BONE BIOPSY  09/15/2019  . KYPHOSIS SURGERY  08/2007   cement used  . OVARIAN CYST SURGERY     left  . TONSILLECTOMY    . TOTAL KNEE ARTHROPLASTY  06/2010   left  . TUBAL LIGATION      Allergies as of 10/08/2019      Reactions   Penicillins Hives, Rash, Other (See Comments)   Has patient had a PCN reaction causing immediate rash, facial/tongue/throat swelling, SOB or  lightheadedness with hypotension: Yes Has patient had a PCN reaction causing severe rash involving mucus membranes or skin necrosis: yes Has patient had a PCN reaction that required hospitalization: No Has patient had a PCN reaction occurring within the last 10 years: no If all of the above answers are "NO", then may proceed with Cephalosporin use. Other Reaction: OTHER REACTION   Valsartan Itching, Other (See Comments)   Atorvastatin Diarrhea   Carvedilol Other (See Comments)   "teeth hurt when I took it"   Ezetimibe Nausea Only   Fluvastatin Sodium Other (See Comments)   "Can't remember"   Magnesium Hydroxide Other (See Comments)   REACTION: triggers HAs   Meloxicam Other (See Comments)   Pt seeing auro's /  spots.     Pneumovax [pneumococcal Polysaccharide Vaccine] Other (See Comments)   Local reaction   Quinapril Hcl Other (See Comments)    "feelings of tiredness"   Simvastatin Diarrhea   Topamax [topiramate] Itching   Vit D-vit E-safflower Oil Other (See Comments)   Headaches      Medication List       Accurate as of Oct 08, 2019 11:59 PM. If you have any questions, ask your nurse or doctor.        STOP taking these medications   acetaminophen 650 MG CR tablet Commonly known as: TYLENOL Stopped by: Kathlene November, MD   HYDROcodone-acetaminophen 10-325 MG tablet Commonly known as: NORCO Replaced by: HYDROcodone-acetaminophen 5-325 MG tablet Stopped by: Kathlene November, MD     TAKE these medications   acetaminophen-codeine 300-30 MG tablet Commonly known as: TYLENOL #3 Take 1 tablet by mouth every 8 (eight) hours as needed for moderate pain.   aspirin 81 MG tablet Take 81 mg by mouth daily.   bisacodyl 10 MG suppository Commonly known as: DULCOLAX Place 1 suppository (10 mg total) rectally daily as needed for moderate constipation.   bismuth subsalicylate 99991111 [id number] suspension Commonly known as: PEPTO BISMOL Take 30 mLs by mouth daily as needed for indigestion or diarrhea or loose stools.   BOOST BREEZE PO Take by mouth. Boost Breeze qd d/t poor appetite.   docusate sodium 100 MG capsule Commonly known as: COLACE Take 100 mg by mouth 2 (two) times daily.   HYDROcodone-acetaminophen 5-325 MG tablet Commonly known as: NORCO/VICODIN Take 1 tablet by mouth 2 (two) times daily as needed. Replaces: HYDROcodone-acetaminophen 10-325 MG tablet Started by: Kathlene November, MD   hydroxyurea 500 MG capsule Commonly known as: HYDREA Take 1 capsule (500 mg total) by mouth every other day. May take with food to minimize GI side effects.   Lidocaine 4 % Ptch LIDOCAINE 4% PATCH :Apply 1 patch topically daily and remove after 12 hours   LORazepam 0.5 MG tablet Commonly known as:  ATIVAN TAKE 1/2 TABLET BY MOUTH EVERY MORNING AND EVERY AFTERNOON. THEN TAKE 2 TABLETS BY MOUTH AT BEDTIME AS NEEDED What changed: Another medication with the same name was removed. Continue taking this medication, and follow the directions you see here. Changed by: Kathlene November, MD   metoprolol tartrate 50 MG tablet Commonly known as: LOPRESSOR Take 1 tablet (50 mg total) by mouth 2 (two) times daily.   NON FORMULARY DIET :Regular NAS Heart heallthy   omeprazole 20 MG capsule Commonly known as: PRILOSEC Take 1 capsule (20 mg total) by mouth daily.   ondansetron 4 MG tablet Commonly known as: ZOFRAN Take 1 tablet (4 mg total) by mouth every 6 (six) hours as needed for nausea or vomiting.  polyethylene glycol 17 g packet Commonly known as: MIRALAX / GLYCOLAX Take 17 g by mouth daily.   tizanidine 2 MG capsule Commonly known as: Zanaflex Take 1 capsule (2 mg total) by mouth 3 (three) times daily as needed for muscle spasms.   valACYclovir 500 MG tablet Commonly known as: VALTREX Take 2 tablets at onset of rash and 2 tablets 12 hours later as needed.          Objective:   Physical Exam BP 134/65 (BP Location: Right Arm, Patient Position: Sitting, Cuff Size: Small)   Pulse 64   Temp (!) 97.4 F (36.3 C) (Temporal)   Resp 18   Ht 5\' 6"  (1.676 m)   Wt 151 lb 8 oz (68.7 kg)   SpO2 97%   BMI 24.45 kg/m  General:   Well developed, NAD, BMI noted.  Sitting in a wheelchair, seems comfortable HEENT:  Normocephalic . Face symmetric, atraumatic Lungs:  CTA B Normal respiratory effort, no intercostal retractions, no accessory muscle use. Heart: RRR,  no murmur. Abdomen: Examined  while the patient sitting, not tender, soft. Lower extremities: Trace pretibial edema bilaterally  Skin: Not pale. Not jaundice Neurologic:  alert & oriented X3.  Speech normal, gait not tested. Psych--  Cognition and judgment appear intact.  Cooperative with normal attention span and  concentration.  Behavior appropriate. No anxious or depressed appearing.      Assessment     Assessment  HTN Dyslipidemia: Intolerant to most medicines including Zetia, Pravachol, Niaspan Depression, anxiety , insomnia - on ativan bid (03-2015 citalopram cause drowsiness, 04-2015 prozac:diarrhea) Hem-Onc: POLYCYTEMIA: sees hem-onc H/o breast ca  MSK: ---DJD :  Tylenol #3, uses rarely ---Osteoporosis:  T score -2.8 (02-2013): declined treatment 06-2017 ---L2 fractures, s/p vertebral plasty 2009 Migraine headaches - Tylenol #3 (rarely)  GI: GERD- intolerant to prevacid, visual disturbances HH, diverticulitis, Colon polyps  h/o "functional " LLQ abd pain CV: ---Cardiomyopathy Probable Takotsubo cardiomyopathy: CP w/ normal cath in 1994.   Severe CP  2003 with widespread T wave inversions on ECG.  Cath with minimal coronary disease but LV-gram showed periapical severe hypokinesis and basilar hyperkinesia (EF 40%) >>> ECHO 4/09 confirmed full LV functional recovery with EF 60%, mild LVH. 2012: Nl EF per echo, (-) Lexiscan 05/21/18 ECHO Nl/stable ---Stroke / TIA 2012 Sleep apnea 2012, , not on CPAP H/oDVT, right leg after venous ablation Raynaud phenomena, DX 05/2018  PLAN Vertebral fracture: Status post admission to the hospital for pain control, had a kyphoplasty.  Subsequently went to a nursing home and now she is back home. Pain is relatively well controlled, in the past she used to take Tylenol # 3, now has some Vicodin as well. I asked the patient which  helps the best and she said hydrocodone. Plan: DC Tylenol # 3, use hydrocodone 5 mg twice daily as needed, Rx sent. HTN: Used to be on metoprolol and HCTZ, currently not on diuretics and BPs are satisfactory.  Plan: Stay on metoprolol only, monitor BPs. Depression, anxiety, insomnia: Prior to the most recent visit she was on Ativan 0.5 mg  half tablet twice a day and 2 at bedtime.  No change, RF as needed Nausea: On and off  nausea for a while, no actual abdominal pain, blood in the stools, has Zofran as needed.  No change, call if symptoms increase Polycythemia: Reports she will have blood work today at hematology Osteoporosis: History of osteoporosis, we again discussed the benefits of treatment such as Prolia, she  mentions very hesitant to take any medication Blood work: Needs a BMP, CBC and magnesium, if not done at hematology will call the patient in few days. RTC 3 months   This visit occurred during the SARS-CoV-2 public health emergency.  Safety protocols were in place, including screening questions prior to the visit, additional usage of staff PPE, and extensive cleaning of exam room while observing appropriate contact time as indicated for disinfecting solutions.

## 2019-10-09 ENCOUNTER — Other Ambulatory Visit: Payer: Self-pay

## 2019-10-09 MED ORDER — METOPROLOL TARTRATE 50 MG PO TABS: 50.0000 mg | ORAL_TABLET | Freq: Two times a day (BID) | ORAL | 1 refills | Status: DC

## 2019-10-09 NOTE — Patient Outreach (Signed)
[facility]) Care Management  10/09/2019  [name] 1929/12/21 [id number]   New referral: Second outreach call to patient what was successful.  Reviewed with patient the reason for call. Patient reports to me that she is doing very well. Reports er daughter is picking up meds and food. Reports her pain in her back is not bad. Reports using a heating pad and hydrocodone  Reports hydocodone  makes her constipated and so she only uses if needed.   Patient reports she is walking well with a cane with out problems. Reports she is taking her medications as prescribed.  Reports she has seen her primary MD for follow up already.  Reviewed interest in program and patient wants to see how she does over the weekend.   PLAN: patient requested a call back on June 1. Will plan follow up on June 1.  [name], RN, BSN, CEN [facility] [employer] [phone]

## 2019-10-11 ENCOUNTER — Telehealth: Payer: Self-pay | Admitting: Internal Medicine

## 2019-10-11 DIAGNOSIS — I10 Essential (primary) hypertension: Secondary | ICD-10-CM

## 2019-10-11 NOTE — Assessment & Plan Note (Signed)
Vertebral fracture: Status post admission to the hospital for pain control, had a kyphoplasty.  Subsequently went to a nursing home and now she is back home. Pain is relatively well controlled, in the past she used to take Tylenol # 3, now has some Vicodin as well. I asked the patient which  helps the best and she said hydrocodone. Plan: DC Tylenol # 3, use hydrocodone 5 mg twice daily as needed, Rx sent. HTN: Used to be on metoprolol and HCTZ, currently not on diuretics and BPs are satisfactory.  Plan: Stay on metoprolol only, monitor BPs. Depression, anxiety, insomnia: Prior to the most recent visit she was on Ativan 0.5 mg  half tablet twice a day and 2 at bedtime.  No change, RF as needed Nausea: On and off nausea for a while, no actual abdominal pain, blood in the stools, has Zofran as needed.  No change, call if symptoms increase Polycythemia: Reports she will have blood work today at hematology Osteoporosis: History of osteoporosis, we again discussed the benefits of treatment such as Prolia, she mentions very hesitant to take any medication Blood work: Needs a BMP, CBC and magnesium, if not done at hematology will call the patient in few days. RTC 3 months

## 2019-10-11 NOTE — Telephone Encounter (Signed)
Since the last visit, she did not get labs. Arrange for blood work: BMP, CBC, magnesium. DX HTN

## 2019-10-13 ENCOUNTER — Other Ambulatory Visit: Payer: Self-pay

## 2019-10-13 NOTE — Telephone Encounter (Signed)
Spoke to the patient she stated she had surgery and is going to call us back later to schedule appointment.

## 2019-10-13 NOTE — Patient Outreach (Signed)
[facility]) Care Management  10/13/2019  [name] 07/13/1929 [id number]   Telephone assessment:  Placed additional call to patient at her request.  Reviewed patients condition and she reports she is doing well but would be interested in telephone follow up.    Encouraged patient to reconsider home health PT.  Patient reports that she continues to have lower back pain from below previous procedure.  Reviewed with patient the importance of taking her medication as prescribed. Patient states she has bike pedals but has not started to use them yet.  Reports she is using a heating pad.  Reviewed safety with heating pad.   Patient reports she has had her follow up with primary MD.    PLAN: will call patient back in 1 week for follow up and completion of assessments. Will Corporate treasurer. Reviewed fall precautions with patient via phone as well.  [name], RN, BSN, CEN [facility] [employer] [phone]

## 2019-10-13 NOTE — Telephone Encounter (Signed)
Pt was supposed to see Dr. Marin Olp on 10/08/19 and have labs but Epic was down and she was unable to. Mitzo- can you contact Pt to schedule lab appt at her earliest convenience please? I have entered orders. TY.

## 2019-10-16 ENCOUNTER — Ambulatory Visit: Payer: Medicare Other | Admitting: Family

## 2019-10-16 ENCOUNTER — Other Ambulatory Visit: Payer: Medicare Other

## 2019-10-19 ENCOUNTER — Ambulatory Visit: Payer: Self-pay

## 2019-10-20 ENCOUNTER — Telehealth: Payer: Self-pay | Admitting: Internal Medicine

## 2019-10-20 ENCOUNTER — Other Ambulatory Visit: Payer: Self-pay

## 2019-10-20 NOTE — Patient Outreach (Signed)
[facility]) Care Management  10/20/2019  Aydee Mcnew 1930/04/04 [id number]   Telephone assessment:  Placed call to patient for follow up on progress. No answer. No voicemail.  PLAN: will call back in 3 days.  [name], RN, BSN, CEN [facility] [employer] [phone]

## 2019-10-20 NOTE — Progress Notes (Signed)
  Chronic Care Management   Outreach Note  10/20/2019 Name: Shakeria Robinette MRN: [id number] DOB: 1929-09-15  Referred by: Colon Branch, MD Reason for referral : No chief complaint on file.   An unsuccessful telephone outreach was attempted today. The patient was referred to the pharmacist for assistance with care management and care coordination.   This note is not being shared with the patient for the following reason: To respect privacy (The patient or proxy has requested that the information not be shared).  Follow Up Plan:   Earney [place] Upstream Scheduler

## 2019-10-23 ENCOUNTER — Other Ambulatory Visit: Payer: Self-pay

## 2019-10-23 NOTE — Patient Outreach (Signed)
[facility]) Care Management   10/23/2019  [name] November 22, 1929 [id number]  [name] is an 86 y.o. female  Subjective: Patient reports she is doing well but continues to have back pain.  Reports she takes tylenol as needed and uses a heating pad. Reports when back pain gets really bad she will lay down.  Reports she has started to drive again.  Reports she is able to take a bath and dress without difficulty.   Objective:  Awake and alert and talking in complete sentences.  Today's Vitals   10/23/19 0855  Weight: 151 lb (68.5 kg)  Height: 1.676 m (5' 6" )  PainSc: 7       Encounter Medications:   Outpatient Encounter Medications as of 10/23/2019  Medication Sig Note  . aspirin 81 MG tablet Take 81 mg by mouth daily.   . bisacodyl (DULCOLAX) 10 MG suppository Place 1 suppository (10 mg total) rectally daily as needed for moderate constipation.   . bismuth subsalicylate (PEPTO BISMOL) 262 MG/15ML suspension Take 30 mLs by mouth daily as needed for indigestion or diarrhea or loose stools.    . hydroxyurea (HYDREA) 500 MG capsule Take 1 capsule (500 mg total) by mouth every other day. May take with food to minimize GI side effects.   Marland Kitchen LORazepam (ATIVAN) 0.5 MG tablet TAKE 1/2 TABLET BY MOUTH EVERY MORNING AND EVERY AFTERNOON. THEN TAKE 2 TABLETS BY MOUTH AT BEDTIME AS NEEDED   . metoprolol tartrate (LOPRESSOR) 50 MG tablet Take 1 tablet (50 mg total) by mouth 2 (two) times daily.   . NON FORMULARY DIET :Regular NAS Heart heallthy   . omeprazole (PRILOSEC) 20 MG capsule Take 1 capsule (20 mg total) by mouth daily.   . ondansetron (ZOFRAN) 4 MG tablet Take 1 tablet (4 mg total) by mouth every 6 (six) hours as needed for nausea or vomiting.   . polyethylene glycol (MIRALAX / GLYCOLAX) 17 g packet Take 17 g by mouth daily.   . valACYclovir (VALTREX) 500 MG tablet Take 2 tablets at onset of rash and 2 tablets 12 hours later as needed.   . [EXPIRED]  acetaminophen-codeine (TYLENOL #3) 300-30 MG tablet Take 1 tablet by mouth every 8 (eight) hours as needed for moderate pain. 10/08/2019: Switch to hydrocodone  . docusate sodium (COLACE) 100 MG capsule Take 100 mg by mouth 2 (two) times daily. (Patient not taking: Reported on 10/23/2019)   . HYDROcodone-acetaminophen (NORCO/VICODIN) 5-325 MG tablet Take 1 tablet by mouth 2 (two) times daily as needed. (Patient not taking: Reported on 10/23/2019)   . Lidocaine 4 % PTCH LIDOCAINE 4% PATCH :Apply 1 patch topically daily and remove after 12 hours (Patient not taking: Reported on 10/23/2019)   . Nutritional Supplements (BOOST BREEZE PO) Take by mouth. Boost Breeze qd d/t poor appetite. (Patient not taking: Reported on 10/23/2019)   . tizanidine (ZANAFLEX) 2 MG capsule Take 1 capsule (2 mg total) by mouth 3 (three) times daily as needed for muscle spasms. (Patient not taking: Reported on 10/23/2019)    No facility-administered encounter medications on file as of 10/23/2019.    Functional Status:   In your present state of health, do you have any difficulty performing the following activities: 10/23/2019 09/13/2019  Hearing? Y N  Vision? Y N  Difficulty concentrating or making decisions? Y N  Walking or climbing stairs? Y N  Dressing or bathing? N N  Doing errands, shopping? N N  Preparing Food and eating ? N -  Using the Toilet? N -  In the past six months, have you accidently leaked urine? N -  Do you have problems with loss of bowel control? N -  Managing your Medications? N -  Managing your Finances? N -  Housekeeping or managing your Housekeeping? N -  Some recent data might be hidden    Fall/Depression Screening:    Fall Risk  10/23/2019 07/15/2018 06/24/2017  Falls in the past year? 0 0 No  Number falls in past yr: 0 - -  Injury with Fall? - - -  Risk Factor Category  - - -  Risk for fall due to : - - -  Risk for fall due to: Comment - - -   PHQ 2/9 Scores 10/23/2019 06/29/2019 01/26/2019  05/19/2018 06/24/2017 05/10/2016 05/04/2015  PHQ - 2 Score 1 3 3  0 0 0 0  PHQ- 9 Score - 7 7 0 - - -    Assessment:   (1) recent diagnosis of compression fracture to T9. S/p kyphoplasty. (2) taking all medications as prescribed.  (3) no advanced directives. (4) pain level 7 (5) PT to start today  Plan:  (1)Reviewed with patient to follow up with primary care doctor for continued back pain. (2) reviewed importance of pain control and being able to stay active. (3) Will mail advanced directive packet and follow up with patient in the upcoming weeks. Reviewed with patient that she and her daughter can complete and take to a notary. (4)  Reviewed importance of pain control and encouraged patient to take pain medications before pain to really bad.  (5) reviewed importance of PT. Encouraged patient to do home exercise plan as recommend after first PT visit.  THN CM Care Plan Problem One     Most Recent Value  Care Plan Problem One Recent back procedure  Role Documenting the Problem One Care Management [place] for Problem One Active  THN Long Term Goal  Patient will verbalize decreased back pain in thenext 31 days.   THN Long Term Goal Start Date 10/13/19  Interventions for Problem One Long Term Goal Reviewed and assessed current pain levels. Reviewed patient is taking tylenol and using the heating pad. reviewed importance of following up with MD and reporting changes in condition.   THN CM Short Term Goal #1  Patient will report no falls in the next 30 days.   THN CM Short Term Goal #1 Start Date 10/13/19  Interventions for Short Term Goal #1 Reviewed fall precautions and safety measures to use in the home. Currently using a cane and holding onto furniture. PT to come today.   THN CM Short Term Goal #2  Patient will report understanding about risk of using a heating pad in the next 7 days.   THN CM Short Term Goal #2 Start Date 10/13/19  [facility] CM Short Term Goal #2 Met Date  10/23/19      This note sent to MD. Will plan follow up in 1 week.  [name], RN, BSN, CEN [facility] [employer] [phone]

## 2019-10-29 ENCOUNTER — Inpatient Hospital Stay: Payer: Medicare Other

## 2019-10-29 ENCOUNTER — Other Ambulatory Visit: Payer: Self-pay

## 2019-10-29 ENCOUNTER — Inpatient Hospital Stay: Payer: Medicare Other | Attending: Hematology & Oncology | Admitting: Family

## 2019-10-29 ENCOUNTER — Encounter: Payer: Self-pay | Admitting: Family

## 2019-10-29 VITALS — BP 136/61 | HR 70 | Temp 98.3°F | Resp 18 | Ht 66.0 in | Wt 149.1 lb

## 2019-10-29 DIAGNOSIS — D751 Secondary polycythemia: Secondary | ICD-10-CM | POA: Diagnosis not present

## 2019-10-29 DIAGNOSIS — D509 Iron deficiency anemia, unspecified: Secondary | ICD-10-CM | POA: Insufficient documentation

## 2019-10-29 DIAGNOSIS — D500 Iron deficiency anemia secondary to blood loss (chronic): Secondary | ICD-10-CM | POA: Diagnosis not present

## 2019-10-29 DIAGNOSIS — Z853 Personal history of malignant neoplasm of breast: Secondary | ICD-10-CM | POA: Diagnosis not present

## 2019-10-29 DIAGNOSIS — D45 Polycythemia vera: Secondary | ICD-10-CM | POA: Insufficient documentation

## 2019-10-29 LAB — CBC WITH DIFFERENTIAL (CANCER CENTER ONLY)
Abs Immature Granulocytes: 0.04 10*3/uL (ref 0.00–0.07)
Basophils Absolute: 0.1 10*3/uL (ref 0.0–0.1)
Basophils Relative: 1 %
Eosinophils Absolute: 0.1 10*3/uL (ref 0.0–0.5)
Eosinophils Relative: 2 %
HCT: 50.7 % — ABNORMAL HIGH (ref 36.0–46.0)
Hemoglobin: 15.7 g/dL — ABNORMAL HIGH (ref 12.0–15.0)
Immature Granulocytes: 1 %
Lymphocytes Relative: 12 %
Lymphs Abs: 0.8 10*3/uL (ref 0.7–4.0)
MCH: 29 pg (ref 26.0–34.0)
MCHC: 31 g/dL (ref 30.0–36.0)
MCV: 93.7 fL (ref 80.0–100.0)
Monocytes Absolute: 0.6 10*3/uL (ref 0.1–1.0)
Monocytes Relative: 8 %
Neutro Abs: 5.3 10*3/uL (ref 1.7–7.7)
Neutrophils Relative %: 76 %
Platelet Count: 477 10*3/uL — ABNORMAL HIGH (ref 150–400)
RBC: 5.41 MIL/uL — ABNORMAL HIGH (ref 3.87–5.11)
RDW: 23.1 % — ABNORMAL HIGH (ref 11.5–15.5)
WBC Count: 6.9 10*3/uL (ref 4.0–10.5)
nRBC: 0 % (ref 0.0–0.2)

## 2019-10-29 LAB — CMP (CANCER CENTER ONLY)
ALT: 12 U/L (ref 0–44)
AST: 18 U/L (ref 15–41)
Albumin: 4.2 g/dL (ref 3.5–5.0)
Alkaline Phosphatase: 64 U/L (ref 38–126)
Anion gap: 6 (ref 5–15)
BUN: 6 mg/dL — ABNORMAL LOW (ref 8–23)
CO2: 30 mmol/L (ref 22–32)
Calcium: 9.9 mg/dL (ref 8.9–10.3)
Chloride: 99 mmol/L (ref 98–111)
Creatinine: 0.73 mg/dL (ref 0.44–1.00)
GFR, Est AFR Am: >60 mL/min (ref >60)
GFR, Estimated: >60 mL/min (ref >60)
Glucose, Bld: 116 mg/dL — ABNORMAL HIGH (ref 70–99)
Potassium: 4.3 mmol/L (ref 3.5–5.1)
Sodium: 135 mmol/L (ref 135–145)
Total Bilirubin: 1.6 mg/dL — ABNORMAL HIGH (ref 0.3–1.2)
Total Protein: 6.5 g/dL (ref 6.5–8.1)

## 2019-10-29 NOTE — Progress Notes (Signed)
Hematology and Oncology Follow Up Visit  Khamila Bassinger [id number] 1929/12/01 86 y.o. 10/29/2019   Principle Diagnosis:  Polycythemia vera - JAK2 positive Stage I (T1bN0M0 ) infiltrating duct carcinoma the left breast  Iron deficiency anemia -- symptomatic  Past Therapy: Femara 2.5 mg by mouth daily - stopped 08/2015  Current Therapy:        Phlebotomy to maintain hematocrit below 45% Hydrea 500 mg by mouth every other day -- re-started on 07/16/2019 IV Iron as needed   Interim History:  Ms. Crossland is here today for follow-up. She is doing fairly well but states that she still back pain that "moves around".  She received IV iron at her last visit and her Hct is up a bit at 50%.  She is doing well on Hydrea and taking every other day as prescribed. Hgb is 15.7 and platelets 477.  No episodes of bleeding. No petechiae.  No fever, chills, n/v, cough, rash, dizziness, SOB, chest pain, palpitations, abdominal pain or changes in bowel or bladder habits.  No swelling in her extremities at this time. She has numbness and tingling in her feet at times.  No falls or syncopal episodes. She ambulates with a cane for added support.  She states that she does not have much of an appetite but makes herself eat. She has issues with dailry bothering her stomach so she has not been drinking boost or ensure. I gave her some Boost Breeze juice to try and some coupons. She is staying well hydrated. Her weight is stable.   ECOG Performance Status: 1 - Symptomatic but completely ambulatory  Medications:  Allergies as of 10/29/2019      Reactions   Penicillins Hives, Rash, Other (See Comments)   Has patient had a PCN reaction causing immediate rash, facial/tongue/throat swelling, SOB or lightheadedness with hypotension: Yes Has patient had a PCN reaction causing severe rash involving mucus membranes or skin necrosis: yes Has patient had a PCN reaction that required hospitalization: No Has  patient had a PCN reaction occurring within the last 10 years: no If all of the above answers are "NO", then may proceed with Cephalosporin use. Other Reaction: OTHER REACTION   Valsartan Itching, Other (See Comments)   Atorvastatin Diarrhea   Carvedilol Other (See Comments)   "teeth hurt when I took it"   Ezetimibe Nausea Only   Fluvastatin Sodium Other (See Comments)   "Can't remember"   Magnesium Hydroxide Other (See Comments)   REACTION: triggers HAs   Meloxicam Other (See Comments)   Pt seeing auro's / spots.     Pneumovax [pneumococcal Polysaccharide Vaccine] Other (See Comments)   Local reaction   Quinapril Hcl Other (See Comments)    "feelings of tiredness"   Simvastatin Diarrhea   Topamax [topiramate] Itching   Vit D-vit E-safflower Oil Other (See Comments)   Headaches      Medication List       Accurate as of October 29, 2019  2:47 PM. If you have any questions, ask your nurse or doctor.        STOP taking these medications   acetaminophen-codeine 300-30 MG tablet Commonly known as: TYLENOL #3 Stopped by: [name], NP     TAKE these medications   aspirin 81 MG tablet Take 81 mg by mouth daily.   bisacodyl 10 MG suppository Commonly known as: DULCOLAX Place 1 suppository (10 mg total) rectally daily as needed for moderate constipation.   bismuth subsalicylate 409 WJ/19JY suspension Commonly known as:  PEPTO BISMOL Take 30 mLs by mouth daily as needed for indigestion or diarrhea or loose stools.   BOOST BREEZE PO Take by mouth. Boost Breeze qd d/t poor appetite.   docusate sodium 100 MG capsule Commonly known as: COLACE Take 100 mg by mouth 2 (two) times daily.   HYDROcodone-acetaminophen 5-325 MG tablet Commonly known as: NORCO/VICODIN Take 1 tablet by mouth 2 (two) times daily as needed.   hydroxyurea 500 MG capsule Commonly known as: HYDREA Take 1 capsule (500 mg total) by mouth every other day. May take with food to minimize GI side  effects.   Lidocaine 4 % Ptch LIDOCAINE 4% PATCH :Apply 1 patch topically daily and remove after 12 hours   LORazepam 0.5 MG tablet Commonly known as: ATIVAN TAKE 1/2 TABLET BY MOUTH EVERY MORNING AND EVERY AFTERNOON. THEN TAKE 2 TABLETS BY MOUTH AT BEDTIME AS NEEDED   metoprolol tartrate 50 MG tablet Commonly known as: LOPRESSOR Take 1 tablet (50 mg total) by mouth 2 (two) times daily.   NON FORMULARY DIET :Regular NAS Heart heallthy   omeprazole 20 MG capsule Commonly known as: PRILOSEC Take 1 capsule (20 mg total) by mouth daily.   ondansetron 4 MG tablet Commonly known as: ZOFRAN Take 1 tablet (4 mg total) by mouth every 6 (six) hours as needed for nausea or vomiting.   polyethylene glycol 17 g packet Commonly known as: MIRALAX / GLYCOLAX Take 17 g by mouth daily.   tizanidine 2 MG capsule Commonly known as: Zanaflex Take 1 capsule (2 mg total) by mouth 3 (three) times daily as needed for muscle spasms.   valACYclovir 500 MG tablet Commonly known as: VALTREX Take 2 tablets at onset of rash and 2 tablets 12 hours later as needed.       Allergies:  Allergies  Allergen Reactions  . Penicillins Hives, Rash and Other (See Comments)    Has patient had a PCN reaction causing immediate rash, facial/tongue/throat swelling, SOB or lightheadedness with hypotension: Yes Has patient had a PCN reaction causing severe rash involving mucus membranes or skin necrosis: yes Has patient had a PCN reaction that required hospitalization: No Has patient had a PCN reaction occurring within the last 10 years: no If all of the above answers are "NO", then may proceed with Cephalosporin use.  Other Reaction: OTHER REACTION  . Valsartan Itching and Other (See Comments)  . Atorvastatin Diarrhea  . Carvedilol Other (See Comments)    "teeth hurt when I took it"   . Ezetimibe Nausea Only  . Fluvastatin Sodium Other (See Comments)    "Can't remember"  . Magnesium Hydroxide Other (See  Comments)    REACTION: triggers HAs  . Meloxicam Other (See Comments)    Pt seeing auro's / spots.    . Pneumovax [Pneumococcal Polysaccharide Vaccine] Other (See Comments)    Local reaction  . Quinapril Hcl Other (See Comments)     "feelings of tiredness"  . Simvastatin Diarrhea  . Topamax [Topiramate] Itching  . Vit D-Vit E-Safflower Oil Other (See Comments)    Headaches    Past Medical History, Surgical history, Social history, and Family History were reviewed and updated.  Review of Systems: All other 10 point review of systems is negative.   Physical Exam:  height is 5\' 6"  (1.676 m) and weight is 149 lb 1.9 oz (67.6 kg). Her oral temperature is 98.3 F (36.8 C). Her blood pressure is 136/61 and her pulse is 70. Her respiration is 18 and oxygen  saturation is 99%.   Wt Readings from Last 3 Encounters:  10/29/19 149 lb 1.9 oz (67.6 kg)  10/23/19 151 lb (68.5 kg)  10/08/19 151 lb 8 oz (68.7 kg)    Ocular: Sclerae unicteric, pupils equal, round and reactive to light Ear-nose-throat: Oropharynx clear, dentition fair Lymphatic: No cervical or supraclavicular adenopathy Lungs no rales or rhonchi, good excursion bilaterally Heart regular rate and rhythm, no murmur appreciated Abd soft, nontender, positive bowel sounds, no liver or spleen tip palpated on exam, no fluid wave  MSK no focal spinal tenderness, no joint edema Neuro: non-focal, well-oriented, appropriate affect Breasts: Deferred   Lab Results  Component Value Date   WBC 6.9 10/29/2019   HGB 15.7 (H) 10/29/2019   HCT 50.7 (H) 10/29/2019   MCV 93.7 10/29/2019   PLT 477 (H) 10/29/2019   Lab Results  Component Value Date   FERRITIN 21 08/10/2019   IRON 24 (L) 07/16/2019   TIBC 404 07/16/2019   UIBC 379 07/16/2019   IRONPCTSAT 6 (L) 07/16/2019   Lab Results  Component Value Date   RETICCTPCT 0.8 02/02/2015   RBC 5.41 (H) 10/29/2019   RETICCTABS 41.4 02/02/2015   No results found for: KPAFRELGTCHN,  LAMBDASER, KAPLAMBRATIO No results found for: IGGSERUM, IGA, IGMSERUM No results found for: Odetta Pink, SPEI   Chemistry      Component Value Date/Time   NA 135 10/29/2019 1355   NA 142 03/25/2017 1457   NA 139 04/10/2016 0855   K 4.3 10/29/2019 1355   K 4.1 03/25/2017 1457   K 4.2 04/10/2016 0855   CL 99 10/29/2019 1355   CL 96 (L) 03/25/2017 1457   CO2 30 10/29/2019 1355   CO2 32 03/25/2017 1457   CO2 29 04/10/2016 0855   BUN 6 (L) 10/29/2019 1355   BUN 7 03/25/2017 1457   BUN 10.0 04/10/2016 0855   CREATININE 0.73 10/29/2019 1355   CREATININE 1.0 03/25/2017 1457   CREATININE 0.8 04/10/2016 0855      Component Value Date/Time   CALCIUM 9.9 10/29/2019 1355   CALCIUM 9.8 03/25/2017 1457   CALCIUM 9.5 04/10/2016 0855   ALKPHOS 64 10/29/2019 1355   ALKPHOS 86 (H) 03/25/2017 1457   ALKPHOS 85 04/10/2016 0855   AST 18 10/29/2019 1355   AST 19 04/10/2016 0855   ALT 12 10/29/2019 1355   ALT 15 03/25/2017 1457   ALT 13 04/10/2016 0855   BILITOT 1.6 (H) 10/29/2019 1355   BILITOT 1.26 (H) 04/10/2016 0855       Impression and Plan: Ms. Pupo is a very pleasant 86 yo caucasian female with polycythemia and history of stage I infiltrating ductal carcinoma of the left breast with lumpectomy in 2013 as well as polycythemia, JAK2 positive.  She will continue her same regimen with Hydrea.  No phlebotomy today as she recently got IV iron.  We will plan to see her again in another 5 weeks.  She will contact our office with any questions or concerns. We can certainly see her sooner if needed.   [name], NP 6/17/20212:47 PM

## 2019-10-30 ENCOUNTER — Other Ambulatory Visit: Payer: Self-pay

## 2019-10-30 ENCOUNTER — Telehealth: Payer: Self-pay | Admitting: Family

## 2019-10-30 LAB — IRON AND TIBC
Iron: 107 ug/dL (ref 41–142)
Saturation Ratios: 30 % (ref 21–57)
TIBC: 353 ug/dL (ref 236–444)
UIBC: 246 ug/dL (ref 120–384)

## 2019-10-30 LAB — FERRITIN: Ferritin: 57 ng/mL (ref 11–307)

## 2019-10-30 LAB — LACTATE DEHYDROGENASE: LDH: 176 U/L (ref 98–192)

## 2019-10-30 NOTE — Telephone Encounter (Signed)
Appointments scheduled calendar printed & mailed per 6/17 los 

## 2019-10-30 NOTE — Patient Outreach (Signed)
[facility]) Care Management  10/30/2019  [name] 12-Jan-1930 [id number]   Telephone assessment:  Placed call to patient who answered and reports she is still in the bed. Reports she did not sleep well last night after going to cancer center. Reports her back was achy.  Reports she continues to be as active as she can.  Reports no new problems or concerns today.  PLAN: follow up call in 1 week.  [name], RN, BSN, CEN [facility] [employer] [phone]

## 2019-11-04 ENCOUNTER — Inpatient Hospital Stay (HOSPITAL_COMMUNITY)
Admission: AD | Admit: 2019-11-04 | Discharge: 2019-11-12 | DRG: 982 | Disposition: A | Discharge status: Skilled Nursing Facility | Payer: Medicare Other | Attending: Internal Medicine | Admitting: Internal Medicine

## 2019-11-04 ENCOUNTER — Emergency Department (HOSPITAL_COMMUNITY): Payer: Medicare Other

## 2019-11-04 ENCOUNTER — Encounter (HOSPITAL_COMMUNITY): Payer: Self-pay

## 2019-11-04 ENCOUNTER — Other Ambulatory Visit: Payer: Self-pay

## 2019-11-04 DIAGNOSIS — C50919 Malignant neoplasm of unspecified site of unspecified female breast: Secondary | ICD-10-CM | POA: Diagnosis not present

## 2019-11-04 DIAGNOSIS — I10 Essential (primary) hypertension: Secondary | ICD-10-CM | POA: Diagnosis not present

## 2019-11-04 DIAGNOSIS — E785 Hyperlipidemia, unspecified: Secondary | ICD-10-CM | POA: Diagnosis present

## 2019-11-04 DIAGNOSIS — Z79891 Long term (current) use of opiate analgesic: Secondary | ICD-10-CM

## 2019-11-04 DIAGNOSIS — I6381 Other cerebral infarction due to occlusion or stenosis of small artery: Secondary | ICD-10-CM | POA: Diagnosis not present

## 2019-11-04 DIAGNOSIS — S199XXA Unspecified injury of neck, initial encounter: Secondary | ICD-10-CM | POA: Diagnosis not present

## 2019-11-04 DIAGNOSIS — I5032 Chronic diastolic (congestive) heart failure: Secondary | ICD-10-CM | POA: Diagnosis present

## 2019-11-04 DIAGNOSIS — R278 Other lack of coordination: Secondary | ICD-10-CM | POA: Diagnosis not present

## 2019-11-04 DIAGNOSIS — Z803 Family history of malignant neoplasm of breast: Secondary | ICD-10-CM | POA: Diagnosis not present

## 2019-11-04 DIAGNOSIS — F329 Major depressive disorder, single episode, unspecified: Secondary | ICD-10-CM | POA: Diagnosis not present

## 2019-11-04 DIAGNOSIS — M810 Age-related osteoporosis without current pathological fracture: Secondary | ICD-10-CM | POA: Diagnosis not present

## 2019-11-04 DIAGNOSIS — I619 Nontraumatic intracerebral hemorrhage, unspecified: Secondary | ICD-10-CM | POA: Diagnosis not present

## 2019-11-04 DIAGNOSIS — G4733 Obstructive sleep apnea (adult) (pediatric): Secondary | ICD-10-CM | POA: Diagnosis not present

## 2019-11-04 DIAGNOSIS — R561 Post traumatic seizures: Secondary | ICD-10-CM | POA: Diagnosis present

## 2019-11-04 DIAGNOSIS — M549 Dorsalgia, unspecified: Secondary | ICD-10-CM

## 2019-11-04 DIAGNOSIS — S22040D Wedge compression fracture of fourth thoracic vertebra, subsequent encounter for fracture with routine healing: Secondary | ICD-10-CM | POA: Diagnosis not present

## 2019-11-04 DIAGNOSIS — W101XXA Fall (on)(from) sidewalk curb, initial encounter: Secondary | ICD-10-CM | POA: Diagnosis present

## 2019-11-04 DIAGNOSIS — I2510 Atherosclerotic heart disease of native coronary artery without angina pectoris: Secondary | ICD-10-CM | POA: Diagnosis not present

## 2019-11-04 DIAGNOSIS — S06360D Traumatic hemorrhage of cerebrum, unspecified, without loss of consciousness, subsequent encounter: Secondary | ICD-10-CM | POA: Diagnosis not present

## 2019-11-04 DIAGNOSIS — K5792 Diverticulitis of intestine, part unspecified, without perforation or abscess without bleeding: Secondary | ICD-10-CM | POA: Diagnosis not present

## 2019-11-04 DIAGNOSIS — Y9389 Activity, other specified: Secondary | ICD-10-CM | POA: Diagnosis not present

## 2019-11-04 DIAGNOSIS — F341 Dysthymic disorder: Secondary | ICD-10-CM

## 2019-11-04 DIAGNOSIS — W19XXXD Unspecified fall, subsequent encounter: Secondary | ICD-10-CM | POA: Diagnosis not present

## 2019-11-04 DIAGNOSIS — F418 Other specified anxiety disorders: Secondary | ICD-10-CM | POA: Diagnosis present

## 2019-11-04 DIAGNOSIS — Z8719 Personal history of other diseases of the digestive system: Secondary | ICD-10-CM

## 2019-11-04 DIAGNOSIS — R569 Unspecified convulsions: Secondary | ICD-10-CM | POA: Diagnosis not present

## 2019-11-04 DIAGNOSIS — I610 Nontraumatic intracerebral hemorrhage in hemisphere, subcortical: Secondary | ICD-10-CM

## 2019-11-04 DIAGNOSIS — M25519 Pain in unspecified shoulder: Secondary | ICD-10-CM

## 2019-11-04 DIAGNOSIS — S0101XA Laceration without foreign body of scalp, initial encounter: Secondary | ICD-10-CM | POA: Diagnosis present

## 2019-11-04 DIAGNOSIS — I609 Nontraumatic subarachnoid hemorrhage, unspecified: Secondary | ICD-10-CM | POA: Diagnosis not present

## 2019-11-04 DIAGNOSIS — Z853 Personal history of malignant neoplasm of breast: Secondary | ICD-10-CM | POA: Diagnosis not present

## 2019-11-04 DIAGNOSIS — Z9181 History of falling: Secondary | ICD-10-CM | POA: Diagnosis not present

## 2019-11-04 DIAGNOSIS — S066X0A Traumatic subarachnoid hemorrhage without loss of consciousness, initial encounter: Principal | ICD-10-CM | POA: Diagnosis present

## 2019-11-04 DIAGNOSIS — F419 Anxiety disorder, unspecified: Secondary | ICD-10-CM | POA: Diagnosis not present

## 2019-11-04 DIAGNOSIS — Z800 Family history of malignant neoplasm of digestive organs: Secondary | ICD-10-CM

## 2019-11-04 DIAGNOSIS — M8008XA Age-related osteoporosis with current pathological fracture, vertebra(e), initial encounter for fracture: Secondary | ICD-10-CM | POA: Diagnosis present

## 2019-11-04 DIAGNOSIS — S7002XA Contusion of left hip, initial encounter: Secondary | ICD-10-CM

## 2019-11-04 DIAGNOSIS — Z8249 Family history of ischemic heart disease and other diseases of the circulatory system: Secondary | ICD-10-CM | POA: Diagnosis not present

## 2019-11-04 DIAGNOSIS — R9401 Abnormal electroencephalogram [EEG]: Secondary | ICD-10-CM | POA: Diagnosis not present

## 2019-11-04 DIAGNOSIS — Z20822 Contact with and (suspected) exposure to covid-19: Secondary | ICD-10-CM | POA: Diagnosis present

## 2019-11-04 DIAGNOSIS — S06360A Traumatic hemorrhage of cerebrum, unspecified, without loss of consciousness, initial encounter: Secondary | ICD-10-CM

## 2019-11-04 DIAGNOSIS — I6782 Cerebral ischemia: Secondary | ICD-10-CM | POA: Diagnosis not present

## 2019-11-04 DIAGNOSIS — Z96652 Presence of left artificial knee joint: Secondary | ICD-10-CM | POA: Diagnosis present

## 2019-11-04 DIAGNOSIS — M6281 Muscle weakness (generalized): Secondary | ICD-10-CM | POA: Diagnosis not present

## 2019-11-04 DIAGNOSIS — I708 Atherosclerosis of other arteries: Secondary | ICD-10-CM | POA: Diagnosis present

## 2019-11-04 DIAGNOSIS — Z8673 Personal history of transient ischemic attack (TIA), and cerebral infarction without residual deficits: Secondary | ICD-10-CM

## 2019-11-04 DIAGNOSIS — K219 Gastro-esophageal reflux disease without esophagitis: Secondary | ICD-10-CM | POA: Diagnosis not present

## 2019-11-04 DIAGNOSIS — R0781 Pleurodynia: Secondary | ICD-10-CM

## 2019-11-04 DIAGNOSIS — I639 Cerebral infarction, unspecified: Secondary | ICD-10-CM | POA: Diagnosis not present

## 2019-11-04 DIAGNOSIS — S2242XD Multiple fractures of ribs, left side, subsequent encounter for fracture with routine healing: Secondary | ICD-10-CM | POA: Diagnosis not present

## 2019-11-04 DIAGNOSIS — R079 Chest pain, unspecified: Secondary | ICD-10-CM | POA: Diagnosis not present

## 2019-11-04 DIAGNOSIS — I5030 Unspecified diastolic (congestive) heart failure: Secondary | ICD-10-CM | POA: Diagnosis not present

## 2019-11-04 DIAGNOSIS — D45 Polycythemia vera: Secondary | ICD-10-CM | POA: Diagnosis present

## 2019-11-04 DIAGNOSIS — W19XXXA Unspecified fall, initial encounter: Secondary | ICD-10-CM

## 2019-11-04 DIAGNOSIS — S2242XA Multiple fractures of ribs, left side, initial encounter for closed fracture: Secondary | ICD-10-CM | POA: Diagnosis present

## 2019-11-04 DIAGNOSIS — S065X0A Traumatic subdural hemorrhage without loss of consciousness, initial encounter: Secondary | ICD-10-CM | POA: Diagnosis present

## 2019-11-04 DIAGNOSIS — S22000A Wedge compression fracture of unspecified thoracic vertebra, initial encounter for closed fracture: Secondary | ICD-10-CM | POA: Diagnosis not present

## 2019-11-04 DIAGNOSIS — Z806 Family history of leukemia: Secondary | ICD-10-CM | POA: Diagnosis not present

## 2019-11-04 DIAGNOSIS — R0789 Other chest pain: Secondary | ICD-10-CM

## 2019-11-04 DIAGNOSIS — S22040A Wedge compression fracture of fourth thoracic vertebra, initial encounter for closed fracture: Secondary | ICD-10-CM | POA: Diagnosis not present

## 2019-11-04 DIAGNOSIS — I110 Hypertensive heart disease with heart failure: Secondary | ICD-10-CM | POA: Diagnosis present

## 2019-11-04 DIAGNOSIS — I7090 Unspecified atherosclerosis: Secondary | ICD-10-CM | POA: Diagnosis not present

## 2019-11-04 DIAGNOSIS — H9203 Otalgia, bilateral: Secondary | ICD-10-CM | POA: Diagnosis present

## 2019-11-04 DIAGNOSIS — Z7982 Long term (current) use of aspirin: Secondary | ICD-10-CM

## 2019-11-04 DIAGNOSIS — R2681 Unsteadiness on feet: Secondary | ICD-10-CM | POA: Diagnosis not present

## 2019-11-04 DIAGNOSIS — M25512 Pain in left shoulder: Secondary | ICD-10-CM | POA: Diagnosis not present

## 2019-11-04 DIAGNOSIS — M5440 Lumbago with sciatica, unspecified side: Secondary | ICD-10-CM | POA: Diagnosis not present

## 2019-11-04 DIAGNOSIS — G9341 Metabolic encephalopathy: Secondary | ICD-10-CM | POA: Diagnosis not present

## 2019-11-04 DIAGNOSIS — I7300 Raynaud's syndrome without gangrene: Secondary | ICD-10-CM | POA: Diagnosis not present

## 2019-11-04 DIAGNOSIS — R4182 Altered mental status, unspecified: Secondary | ICD-10-CM | POA: Diagnosis not present

## 2019-11-04 LAB — CBC WITH DIFFERENTIAL/PLATELET
Abs Immature Granulocytes: 0.13 10*3/uL — ABNORMAL HIGH (ref 0.00–0.07)
Basophils Absolute: 0 10*3/uL (ref 0.0–0.1)
Basophils Relative: 0 %
Eosinophils Absolute: 0 10*3/uL (ref 0.0–0.5)
Eosinophils Relative: 0 %
HCT: 53 % — ABNORMAL HIGH (ref 36.0–46.0)
Hemoglobin: 16.2 g/dL — ABNORMAL HIGH (ref 12.0–15.0)
Immature Granulocytes: 1 %
Lymphocytes Relative: 3 %
Lymphs Abs: 0.4 10*3/uL — ABNORMAL LOW (ref 0.7–4.0)
MCH: 29.5 pg (ref 26.0–34.0)
MCHC: 30.6 g/dL (ref 30.0–36.0)
MCV: 96.5 fL (ref 80.0–100.0)
Monocytes Absolute: 0.9 10*3/uL (ref 0.1–1.0)
Monocytes Relative: 8 %
Neutro Abs: 10.9 10*3/uL — ABNORMAL HIGH (ref 1.7–7.7)
Neutrophils Relative %: 88 %
Platelets: 424 10*3/uL — ABNORMAL HIGH (ref 150–400)
RBC: 5.49 MIL/uL — ABNORMAL HIGH (ref 3.87–5.11)
RDW: 21.4 % — ABNORMAL HIGH (ref 11.5–15.5)
WBC: 12.4 10*3/uL — ABNORMAL HIGH (ref 4.0–10.5)
nRBC: 0 % (ref 0.0–0.2)

## 2019-11-04 LAB — COMPREHENSIVE METABOLIC PANEL
ALT: 34 U/L (ref 0–44)
AST: 60 U/L — ABNORMAL HIGH (ref 15–41)
Albumin: 4.3 g/dL (ref 3.5–5.0)
Alkaline Phosphatase: 71 U/L (ref 38–126)
Anion gap: 14 (ref 5–15)
BUN: 8 mg/dL (ref 8–23)
CO2: 25 mmol/L (ref 22–32)
Calcium: 9.4 mg/dL (ref 8.9–10.3)
Chloride: 98 mmol/L (ref 98–111)
Creatinine, Ser: 0.7 mg/dL (ref 0.44–1.00)
GFR calc Af Amer: >60 mL/min (ref >60)
GFR calc non Af Amer: >60 mL/min (ref >60)
Glucose, Bld: 220 mg/dL — ABNORMAL HIGH (ref 70–99)
Potassium: 4.1 mmol/L (ref 3.5–5.1)
Sodium: 137 mmol/L (ref 135–145)
Total Bilirubin: 2.4 mg/dL — ABNORMAL HIGH (ref 0.3–1.2)
Total Protein: 7.3 g/dL (ref 6.5–8.1)

## 2019-11-04 LAB — SARS CORONAVIRUS 2 BY RT PCR (HOSPITAL ORDER, PERFORMED IN ~~LOC~~ HOSPITAL LAB): SARS Coronavirus 2: NEGATIVE

## 2019-11-04 LAB — PROTIME-INR
INR: 1.1 (ref 0.8–1.2)
Prothrombin Time: 13.3 seconds (ref 11.4–15.2)

## 2019-11-04 LAB — APTT: aPTT: 34 seconds (ref 24–36)

## 2019-11-04 MED ORDER — CHLORHEXIDINE GLUCONATE CLOTH 2 % EX PADS
6.0000 | MEDICATED_PAD | Freq: Every day | CUTANEOUS | Status: DC
  Administered 2019-11-05 – 2019-11-08 (×5): 6 via TOPICAL

## 2019-11-04 MED ORDER — LEVETIRACETAM IN NACL 1000 MG/100ML IV SOLN
1000.0000 mg | Freq: Once | INTRAVENOUS | Status: AC
  Administered 2019-11-04: 1000 mg via INTRAVENOUS
  Filled 2019-11-04: qty 100

## 2019-11-04 MED ORDER — ONDANSETRON HCL 4 MG/2ML IJ SOLN
4.0000 mg | Freq: Once | INTRAMUSCULAR | Status: AC
  Administered 2019-11-04: 4 mg via INTRAVENOUS
  Filled 2019-11-04: qty 2

## 2019-11-04 MED ORDER — FENTANYL CITRATE (PF) 100 MCG/2ML IJ SOLN
25.0000 ug | Freq: Four times a day (QID) | INTRAMUSCULAR | Status: DC | PRN
  Administered 2019-11-04 – 2019-11-05 (×2): 25 ug via INTRAVENOUS
  Filled 2019-11-04 (×2): qty 2

## 2019-11-04 MED ORDER — FENTANYL CITRATE (PF) 100 MCG/2ML IJ SOLN
50.0000 ug | Freq: Once | INTRAMUSCULAR | Status: AC
  Administered 2019-11-04: 50 ug via INTRAVENOUS
  Filled 2019-11-04: qty 2

## 2019-11-04 MED ORDER — LORAZEPAM 2 MG/ML IJ SOLN
INTRAMUSCULAR | Status: AC
  Administered 2019-11-04: 1 mg via INTRAVENOUS
  Filled 2019-11-04: qty 1

## 2019-11-04 MED ORDER — FENTANYL CITRATE (PF) 100 MCG/2ML IJ SOLN
50.0000 ug | Freq: Once | INTRAMUSCULAR | Status: AC
  Administered 2019-11-04: 25 ug via INTRAVENOUS
  Filled 2019-11-04: qty 2

## 2019-11-04 MED ORDER — MORPHINE SULFATE (PF) 4 MG/ML IV SOLN
4.0000 mg | Freq: Once | INTRAVENOUS | Status: AC
  Administered 2019-11-04: 4 mg via INTRAVENOUS
  Filled 2019-11-04: qty 1

## 2019-11-04 MED ORDER — DOCUSATE SODIUM 100 MG PO CAPS: 100.0000 mg | ORAL_CAPSULE | Freq: Two times a day (BID) | ORAL | Status: DC | PRN

## 2019-11-04 MED ORDER — POLYETHYLENE GLYCOL 3350 17 G PO PACK
17.0000 g | PACK | Freq: Every day | ORAL | Status: DC | PRN
  Administered 2019-11-09: 17 g via ORAL
  Filled 2019-11-04: qty 1

## 2019-11-04 MED ORDER — LEVETIRACETAM IN NACL 500 MG/100ML IV SOLN
500.0000 mg | Freq: Two times a day (BID) | INTRAVENOUS | Status: DC
  Administered 2019-11-04 – 2019-11-11 (×15): 500 mg via INTRAVENOUS
  Filled 2019-11-04 (×15): qty 100

## 2019-11-04 MED ORDER — LORAZEPAM 2 MG/ML IJ SOLN
1.0000 mg | Freq: Once | INTRAMUSCULAR | Status: AC
  Administered 2019-11-04: 1 mg via INTRAVENOUS

## 2019-11-04 NOTE — H&P (Signed)
NAME:  [name], MRN:  [id number], DOB:  [birth year], LOS: 0 ADMISSION DATE:  11/04/2019, CONSULTATION DATE:  11/04/2019 REFERRING MD:  EDP, CHIEF COMPLAINT:  Fall, ICH, seizure   Brief History   86 year old with multiple medical issues including Polycythemia vera, breast cancer, CAD, CVA, diverticulitis, GERD, CVA, OSA.  Admitted with mechanical fall after tripping over curb.. Found to have [place] on CT imaging. She is not a candidate for surgical intervention. She had a seizure activity in ED with 1 min of full tonic-clonic activity and given ativan and loaded with keppra. PCCM consulted for admission  Past Medical History    has a past medical history of CAD (coronary artery disease), Cancer ([place]), Cardiomyopathy, Chronic diastolic CHF (congestive heart failure) ([place]) (09/13/2019), CVA (cerebral infarction), Depression with anxiety (03-29-11), Diverticulitis, DVT (deep venous thrombosis) ([place]), E. coli gastroenteritis (03-29-11), Fracture (09/10/07), GERD (gastroesophageal reflux disease), Headache(784.0), Hemorrhoids, Hiatal hernia (03-29-11), Hyperlipidemia, Hyperplastic colon polyp (06/2001), Hypertension, Iron deficiency anemia due to chronic blood loss (12/30/2017), OA (osteoarthritis) of knee (03-29-11), OSA (obstructive sleep apnea) (03-29-11), Osteoporosis, Otalgia of both ears, Polycythemia, Stroke ([place]) (03-29-11), and Varicose vein.  Significant Hospital Events   6/23 Admit  Consults:  Neurosurgery, Neurology  Procedures:    Significant Diagnostic Tests:    Micro Data:    Antimicrobials:    Interim history/subjective:   At time of my admission she has received ativan 1 hr earlier for seizure and was non purposeful  Objective   Blood pressure (!) 147/64, pulse 85, temperature 97.8 F (36.6 C), temperature source Oral, resp. rate 16, height 5\' 6"  (1.676 m), weight 67.6 kg, SpO2 93 %.        Intake/Output Summary (Last 24 hours) at 11/04/2019 1842 Last  data filed at 11/04/2019 1642 Gross per 24 hour  Intake 100 ml  Output --  Net 100 ml   Filed Weights   11/04/19 1152  Weight: 67.6 kg    Examination:. Gen:      No acute distress, C collar HEENT:  EOMI, sclera anicteric Neck:     No masses; no thyromegaly Lungs:    Clear to auscultation bilaterally; normal respiratory effort CV:         Regular rate and rhythm; no murmurs Abd:      + bowel sounds; soft, non-tender; no palpable masses, no distension Ext:    No edema; adequate peripheral perfusion Skin:      Warm and dry; no rash Neuro: Somnolent, moves extremities. Does not respond to commands  Resolved Hospital Problem list     Assessment & Plan:  Mechanical fall ICH, SAH New onset seizures Not a surgical candidate Repeat CT tomorrow per neurosurgery Continue keppra EEG ordered and neuro consulted Admit to 4N at [facility]  Polycythemia vera, stage I ca of left breast s/p lumpectomy in 2013 Followed by onc with regular phlebotomy and IV iron Resume hydroxy urea when able to take PO  Acute encephalopathy due to hemorrhage, seizure Monitor mental status.   HTN Hold lopressor  Best practice:  Diet: NPO Pain/Anxiety/Delirium protocol (if indicated): Fentanyl PRN VAP protocol (if indicated): NA DVT prophylaxis: Avoid heparin. SCDs GI prophylaxis: NA Glucose control: Monitor Mobility: Bed Code Status: Full Family Communication: Discussed with patient's grandson. Family requests full code Disposition: ICU  Labs   CBC: Recent Labs  Lab 10/29/19 1355 11/04/19 1700  WBC 6.9 12.4*  NEUTROABS 5.3 10.9*  HGB 15.7* 16.2*  HCT 50.7* 53.0*  MCV 93.7 96.5  PLT  477* 424*    Basic Metabolic Panel: Recent Labs  Lab 10/29/19 1355 11/04/19 1700  NA 135 137  K 4.3 4.1  CL 99 98  CO2 30 25  GLUCOSE 116* 220*  BUN 6* 8  CREATININE 0.73 0.70  CALCIUM 9.9 9.4   GFR: Estimated Creatinine Clearance: 45.5 mL/min (by C-G formula based on SCr of 0.7  mg/dL). Recent Labs  Lab 10/29/19 1355 11/04/19 1700  WBC 6.9 12.4*    Liver Function Tests: Recent Labs  Lab 10/29/19 1355 11/04/19 1700  AST 18 60*  ALT 12 34  ALKPHOS 64 71  BILITOT 1.6* 2.4*  PROT 6.5 7.3  ALBUMIN 4.2 4.3   No results for input(s): LIPASE, AMYLASE in the last 168 hours. No results for input(s): AMMONIA in the last 168 hours.  ABG    Component Value Date/Time   PHART 7.479 (H) 09/07/2007 0014   PCO2ART 34.0 (L) 09/07/2007 0014   PO2ART 59.0 (L) 09/07/2007 0014   HCO3 25.5 (H) 09/07/2007 0014   TCO2 31 07/01/2017 1229   O2SAT 93.0 09/07/2007 0014     Coagulation Profile: Recent Labs  Lab 11/04/19 1700  INR 1.1    Cardiac Enzymes: No results for input(s): CKTOTAL, CKMB, CKMBINDEX, TROPONINI in the last 168 hours.  HbA1C: No results found for: HGBA1C  CBG: No results for input(s): GLUCAP in the last 168 hours.  Review of Systems:    Unable to obtain due to encephalopathy  Past Medical History  She,  has a past medical history of CAD (coronary artery disease), Cancer ([place]), Cardiomyopathy, Chronic diastolic CHF (congestive heart failure) ([place]) (09/13/2019), CVA (cerebral infarction), Depression with anxiety (03-29-11), Diverticulitis, DVT (deep venous thrombosis) ([place]), E. coli gastroenteritis (03-29-11), Fracture (09/10/07), GERD (gastroesophageal reflux disease), Headache(784.0), Hemorrhoids, Hiatal hernia (03-29-11), Hyperlipidemia, Hyperplastic colon polyp (06/2001), Hypertension, Iron deficiency anemia due to chronic blood loss (12/30/2017), OA (osteoarthritis) of knee (03-29-11), OSA (obstructive sleep apnea) (03-29-11), Osteoporosis, Otalgia of both ears, Polycythemia, Stroke ([place]) (03-29-11), and Varicose vein.   Surgical History    Past Surgical History:  Procedure Laterality Date  . APPENDECTOMY    . BACK SURGERY    . BREAST BIOPSY    . BREAST CYST EXCISION    . BREAST LUMPECTOMY Left 2013   left stage I left breast cancer  .  CHOLECYSTECTOMY  04/09/2011   Procedure: LAPAROSCOPIC CHOLECYSTECTOMY WITH INTRAOPERATIVE CHOLANGIOGRAM;  Surgeon: Odis Hollingshead, MD;  Location: [facility] ORS;  Service: General;  Laterality: N/A;  . Dental Extraction     L maxillary molar  . IR KYPHO THORACIC WITH BONE BIOPSY  09/15/2019  . KYPHOSIS SURGERY  08/2007   cement used  . OVARIAN CYST SURGERY     left  . TONSILLECTOMY    . TOTAL KNEE ARTHROPLASTY  06/2010   left  . TUBAL LIGATION       Social History   reports that she quit smoking about 31 years ago. Her smoking use included cigarettes. She started smoking about 68 years ago. She has a 37.00 pack-year smoking history. She has never used smokeless tobacco. She reports current alcohol use of about 1.0 standard drink of alcohol per week. She reports that she does not use drugs.   Family History   Her family history includes Breast cancer in her mother and sister; Colon cancer in an other family member; Heart disease in her father; Leukemia in her brother.   Allergies Allergies  Allergen Reactions  . Penicillins Hives, Rash and Other (  See Comments)    Has patient had a PCN reaction causing immediate rash, facial/tongue/throat swelling, SOB or lightheadedness with hypotension: Yes Has patient had a PCN reaction causing severe rash involving mucus membranes or skin necrosis: yes Has patient had a PCN reaction that required hospitalization: No Has patient had a PCN reaction occurring within the last 10 years: no If all of the above answers are "NO", then may proceed with Cephalosporin use.  Other Reaction: OTHER REACTION  . Valsartan Itching and Other (See Comments)  . Atorvastatin Diarrhea  . Carvedilol Other (See Comments)    "teeth hurt when I took it"   . Ezetimibe Nausea Only  . Fluvastatin Sodium Other (See Comments)    "Can't remember"  . Magnesium Hydroxide Other (See Comments)    REACTION: triggers HAs  . Meloxicam Other (See Comments)    Pt seeing auro's / spots.     . Pneumovax [Pneumococcal Polysaccharide Vaccine] Other (See Comments)    Local reaction  . Quinapril Hcl Other (See Comments)     "feelings of tiredness"  . Simvastatin Diarrhea  . Topamax [Topiramate] Itching  . Vit D-Vit E-Safflower Oil Other (See Comments)    Headaches     Home Medications  Prior to Admission medications   Medication Sig Start Date End Date Taking? Authorizing Provider  aspirin 81 MG tablet Take 81 mg by mouth daily.   Yes [provider]  bisacodyl (DULCOLAX) 10 MG suppository Place 1 suppository (10 mg total) rectally daily as needed for moderate constipation. 09/17/19  Yes [name], Laxman, MD  bismuth subsalicylate (PEPTO BISMOL) 262 MG/15ML suspension Take 30 mLs by mouth daily as needed for indigestion or diarrhea or loose stools.    Yes [provider]  docusate sodium (COLACE) 100 MG capsule Take 100 mg by mouth 2 (two) times daily.    Yes [provider]  HYDROcodone-acetaminophen (NORCO/VICODIN) 5-325 MG tablet Take 1 tablet by mouth 2 (two) times daily as needed. Patient taking differently: Take 1 tablet by mouth 2 (two) times daily as needed for moderate pain.  10/08/19  Yes Paz, Alda Berthold, MD  hydroxyurea (HYDREA) 500 MG capsule Take 1 capsule (500 mg total) by mouth every other day. May take with food to minimize GI side effects. 10/05/19  Yes Lassen, Arlo C, PA-C  ibuprofen (ADVIL) 200 MG tablet Take 200 mg by mouth every 6 (six) hours as needed for moderate pain.    Yes [provider]  Lidocaine 4 % PTCH LIDOCAINE 4% PATCH :Apply 1 patch topically daily and remove after 12 hours 10/05/19  Yes Lassen, Arlo C, PA-C  LORazepam (ATIVAN) 0.5 MG tablet TAKE 1/2 TABLET BY MOUTH EVERY MORNING AND EVERY AFTERNOON. THEN TAKE 2 TABLETS BY MOUTH AT BEDTIME AS NEEDED Patient taking differently: Take 0.5 mg by mouth See admin instructions. Take 1/2 tablet by mouth every morning and every afternoon. Then take 2 tablets by mouth at bedtime as  needed. 10/08/19  Yes Paz, Alda Berthold, MD  metoprolol tartrate (LOPRESSOR) 50 MG tablet Take 1 tablet (50 mg total) by mouth 2 (two) times daily. 10/09/19  Yes Paz, Alda Berthold, MD  Nutritional Supplements (BOOST BREEZE PO) Take 1 each by mouth daily.    Yes [provider]  omeprazole (PRILOSEC) 20 MG capsule Take 1 capsule (20 mg total) by mouth daily. 10/05/19  Yes Lassen, Arlo C, PA-C  ondansetron (ZOFRAN) 4 MG tablet Take 1 tablet (4 mg total) by mouth every 6 (six) hours as  needed for nausea or vomiting. 10/05/19  Yes Lassen, Arlo C, PA-C  polyethylene glycol (MIRALAX / GLYCOLAX) 17 g packet Take 17 g by mouth daily.   Yes [provider]  tizanidine (ZANAFLEX) 2 MG capsule Take 1 capsule (2 mg total) by mouth 3 (three) times daily as needed for muscle spasms. 10/05/19  Yes Lassen, Arlo C, PA-C  valACYclovir (VALTREX) 500 MG tablet Take 2 tablets at onset of rash and 2 tablets 12 hours later as needed. 02/16/19  Yes [place], Holli Humbles, NP     Critical care time:    The patient is critically ill with multiple organ system failure and requires high complexity decision making for assessment and support, frequent evaluation and titration of therapies, advanced monitoring, review of radiographic studies and interpretation of complex data.   Critical Care Time devoted to patient care services, exclusive of separately billable procedures, described in this note is 35 minutes.   Marshell Garfinkel MD [facility] Please see [email or web address] for pager details.  11/04/2019, 6:42 PM

## 2019-11-04 NOTE — ED Notes (Addendum)
Pt's family reports pt was nauseous and "spitting up" in an emesis bag. This RN offered pt nausea medication, but pt refused.

## 2019-11-04 NOTE — ED Notes (Signed)
CT made aware that pt has been given a little more pain medicine to attempt to do CT scan again.

## 2019-11-04 NOTE — Progress Notes (Signed)
Orthopedic Tech Progress Note Patient Details:  [name] 11-Oct-1929 [id number]  Ortho Devices Type of Ortho Device: Clavicle strap Ortho Device/Splint Interventions: Application   Post Interventions Patient Tolerated: Well Instructions Provided: Care of device   [name] 11/04/2019, 4:22 PM

## 2019-11-04 NOTE — ED Notes (Addendum)
CT reports that were unable to do CT due to patient being unable to lay flat in the scanner because of head and back pain. PA made aware.

## 2019-11-04 NOTE — ED Triage Notes (Signed)
[facility] Pt had a mechanical fall and landed on her back. Lac to back of head but bleeding controlled. Pt also reports left leg pain. No obvious deformity to leg. Pt was ambulatory with assistance. Pt reported nausea with ambulance ride. Denies taking blood thinners.  Zofran 4mg  20G LFA BP 180/92 HR 66 RR 16 SpO2 98% RA

## 2019-11-04 NOTE — ED Notes (Signed)
Report given to [facility]. 

## 2019-11-04 NOTE — ED Notes (Addendum)
PA and RN were in the room when the pt became semi-unresponsive. Pt was alert, but was not responding to any questions and had a blank stair on her face. Prior to this pt was alert and responding to questions. About 5 minutes later, while the ortho tech and RN was placing a c-collar on the pt, the pt began to turn to her left side and open her mouth wide. Pt was contracted, mildly shaking, and her eyes were rolled towards the back of her head. MD/PA called to bedside at this time.

## 2019-11-04 NOTE — ED Notes (Signed)
[facility] called for transport to [facility].  °

## 2019-11-04 NOTE — ED Provider Notes (Signed)
[facility] DEPT Provider Note   CSN: [id number] Arrival date & time: 11/04/19  1145     History No chief complaint on file.   [name] is a 86 y.o. female has no history of CAD, CVA, diverticulitis, GERD brought by EMS for evaluation of mechanical fall.  Patient reports that [place] and states that she saw somebody she knew.  She started talking to them and states that as she was walking, she did not see the curb and tripped, fell causing her to fall backwards.  No preceding chest pain.  She does report that she hit her head.  She does not think she had any LOC.  She is on aspirin but no other blood thinners.  She also reports pain to her right side of her rib cage as well as her left leg.  She states that she was able to step and ambulate just a step or 2 with the assistance.  She states that it hurts when she tries to move her left leg.  She denies any chest pain, difficulty breathing, abdominal pain, nausea/vomiting, numbness/weakness. She does not know when her last Tdap was.   The history is provided by the patient.       Past Medical History:  Diagnosis Date  . CAD (coronary artery disease)    mild nonobstructive disease on cath in 2003  . Cancer ([place])   . Cardiomyopathy    Probable Takotsubo, severe CP w/ normal cath in 1994. Severe CP in 2003 w/ widespread T wave inversions on ECG. Cath w/ minimal coronary disease but LV-gram showed periapical severe hypokinesis and basilar hyperkinesia (EF 40%). Last echo in 4/09 confirmed full LV functional recovery with EF 60%, no regional wall motion abnormalities, mild to moderate LVH.  Marland Kitchen Chronic diastolic CHF (congestive heart failure) ([place]) 09/13/2019  . CVA (cerebral infarction)   . Depression with anxiety 03-29-11   lost husband 3'09  . Diverticulitis   . DVT (deep venous thrombosis) ([place])    after venous ablation, R leg  . E. coli gastroenteritis 03-29-11   8'10   . Fracture 09/10/07   L2, status post vertebroplasty of L2 performed by IR  . GERD (gastroesophageal reflux disease)   . Headache(784.0)    migraines  . Hemorrhoids   . Hiatal hernia 03-29-11   no nerve problems  . Hyperlipidemia   . Hyperplastic colon polyp 06/2001  . Hypertension   . Iron deficiency anemia due to chronic blood loss 12/30/2017  . OA (osteoarthritis) of knee 03-29-11   w/ bilateral knee pain-not a problem now  . OSA (obstructive sleep apnea) 03-29-11   no cpap used, not a problem now.  . Osteoporosis   . Otalgia of both ears    Dr. Simeon Craft  . Polycythemia   . Stroke [facility]) 03-29-11   CVA x2 -last 10'12-?TIA(visual problems)  . Varicose vein     Patient Active Problem List   Diagnosis Date Noted  . ICH (intracerebral hemorrhage) ([place]) 11/04/2019  . Intractable back pain 09/13/2019  . Compression fracture of T9 vertebra ([place]) 09/13/2019  . Chronic diastolic CHF (congestive heart failure) ([place]) 09/13/2019  . Overweight (BMI 25.0-29.9) 09/13/2019  . Constipation 09/13/2019  . Raynaud's phenomenon without gangrene 05/19/2018  . Iron deficiency anemia due to chronic blood loss 12/30/2017  . Insomnia 06/25/2017  . Mild CAD 05/24/2017  . PCP NOTES >>> 02/15/2015  . Superficial thrombophlebitis 04/06/2014  . Annual physical exam  08/18/2012  . Edema 04/14/2012  . Breast cancer ([place]) 11/05/2011  . Polycythemia 11/05/2011  . TIA (transient ischemic attack) 01/16/2011  . CARDIOMYOPATHY 08/03/2009  . DJD -- pain mgmt 08/16/2008  . ABDOMINAL PAIN, LEFT LOWER QUADRANT 07/05/2008  . GAIT DISTURBANCE 10/21/2007  . ANXIETY DEPRESSION 10/08/2007  . Hyperlipidemia 06/25/2007  . Takotsubo cardiomyopathy 06/25/2007  . GERD without esophagitis 06/25/2007  . Essential hypertension 09/30/2006  . Senile osteoporosis 09/30/2006  . Migraine with aura 09/30/2006    Past Surgical History:  Procedure Laterality Date  . APPENDECTOMY    . BACK SURGERY    . BREAST BIOPSY    .  BREAST CYST EXCISION    . BREAST LUMPECTOMY Left 2013   left stage I left breast cancer  . CHOLECYSTECTOMY  04/09/2011   Procedure: LAPAROSCOPIC CHOLECYSTECTOMY WITH INTRAOPERATIVE CHOLANGIOGRAM;  Surgeon: Odis Hollingshead, MD;  Location: [facility] ORS;  Service: General;  Laterality: N/A;  . Dental Extraction     L maxillary molar  . IR KYPHO THORACIC WITH BONE BIOPSY  09/15/2019  . KYPHOSIS SURGERY  08/2007   cement used  . OVARIAN CYST SURGERY     left  . TONSILLECTOMY    . TOTAL KNEE ARTHROPLASTY  06/2010   left  . TUBAL LIGATION       OB History    Gravida  3   Para  3   Term      Preterm      AB      Living  3     SAB      TAB      Ectopic      Multiple      Live Births              Family History  Problem Relation Age of Onset  . Breast cancer Mother   . Heart disease Father        MIs  . Colon cancer Other        cousin  . Breast cancer Sister   . Leukemia Brother        GM    Social History   Tobacco Use  . Smoking status: Former Smoker    Packs/day: 1.00    Years: 37.00    Pack years: 37.00    Types: Cigarettes    Start date: 06/26/1949    Quit date: 05/14/1988    Years since quitting: 31.4  . Smokeless tobacco: Never Used  . Tobacco comment: quit 25 years ago  Vaping Use  . Vaping Use: Never used  Substance Use Topics  . Alcohol use: Yes    Alcohol/week: 1.0 standard drink    Types: 1 Glasses of wine per week    Comment: occasional/social  . Drug use: No    Home Medications Prior to Admission medications   Medication Sig Start Date End Date Taking? Authorizing Provider  aspirin 81 MG tablet Take 81 mg by mouth daily.   Yes [provider]  bisacodyl (DULCOLAX) 10 MG suppository Place 1 suppository (10 mg total) rectally daily as needed for moderate constipation. 09/17/19  Yes [name], Laxman, MD  bismuth subsalicylate (PEPTO BISMOL) 262 MG/15ML suspension Take 30 mLs by mouth daily as needed for indigestion or diarrhea or  loose stools.    Yes [provider]  docusate sodium (COLACE) 100 MG capsule Take 100 mg by mouth 2 (two) times daily.    Yes [provider]  HYDROcodone-acetaminophen (NORCO/VICODIN) 5-325 MG tablet Take  1 tablet by mouth 2 (two) times daily as needed. Patient taking differently: Take 1 tablet by mouth 2 (two) times daily as needed for moderate pain.  10/08/19  Yes Paz, Alda Berthold, MD  hydroxyurea (HYDREA) 500 MG capsule Take 1 capsule (500 mg total) by mouth every other day. May take with food to minimize GI side effects. 10/05/19  Yes Lassen, Arlo C, PA-C  ibuprofen (ADVIL) 200 MG tablet Take 200 mg by mouth every 6 (six) hours as needed for moderate pain.    Yes [provider]  Lidocaine 4 % PTCH LIDOCAINE 4% PATCH :Apply 1 patch topically daily and remove after 12 hours 10/05/19  Yes Lassen, Arlo C, PA-C  LORazepam (ATIVAN) 0.5 MG tablet TAKE 1/2 TABLET BY MOUTH EVERY MORNING AND EVERY AFTERNOON. THEN TAKE 2 TABLETS BY MOUTH AT BEDTIME AS NEEDED Patient taking differently: Take 0.5 mg by mouth See admin instructions. Take 1/2 tablet by mouth every morning and every afternoon. Then take 2 tablets by mouth at bedtime as needed. 10/08/19  Yes Paz, Alda Berthold, MD  metoprolol tartrate (LOPRESSOR) 50 MG tablet Take 1 tablet (50 mg total) by mouth 2 (two) times daily. 10/09/19  Yes Paz, Alda Berthold, MD  Nutritional Supplements (BOOST BREEZE PO) Take 1 each by mouth daily.    Yes [provider]  omeprazole (PRILOSEC) 20 MG capsule Take 1 capsule (20 mg total) by mouth daily. 10/05/19  Yes Lassen, Arlo C, PA-C  ondansetron (ZOFRAN) 4 MG tablet Take 1 tablet (4 mg total) by mouth every 6 (six) hours as needed for nausea or vomiting. 10/05/19  Yes Oscar La, Arlo C, PA-C  polyethylene glycol (MIRALAX / GLYCOLAX) 17 g packet Take 17 g by mouth daily.   Yes [provider]  tizanidine (ZANAFLEX) 2 MG capsule Take 1 capsule (2 mg total) by mouth 3 (three) times daily as needed for  muscle spasms. 10/05/19  Yes Lassen, Arlo C, PA-C  valACYclovir (VALTREX) 500 MG tablet Take 2 tablets at onset of rash and 2 tablets 12 hours later as needed. 02/16/19  Yes [place], Holli Humbles, NP    Allergies    Penicillins, Valsartan, Atorvastatin, Carvedilol, Ezetimibe, Fluvastatin sodium, Magnesium hydroxide, Meloxicam, Pneumovax [pneumococcal polysaccharide vaccine], Quinapril hcl, Simvastatin, Topamax [topiramate], and Vit d-vit e-safflower oil  Review of Systems   Review of Systems  Constitutional: Negative for fever.  Respiratory: Negative for cough and shortness of breath.   Cardiovascular: Negative for chest pain.  Gastrointestinal: Negative for abdominal pain, nausea and vomiting.  Genitourinary: Negative for dysuria and hematuria.  Musculoskeletal:       LLE Pain Right chest wall pain  Skin: Positive for wound.  Neurological: Positive for headaches. Negative for weakness and numbness.  All other systems reviewed and are negative.   Physical Exam Updated Vital Signs BP 114/73   Pulse 84   Temp 97.8 F (36.6 C) (Oral)   Resp (!) 21   Ht 5\' 6"  (1.676 m)   Wt 67.6 kg   SpO2 (S) 96%   BMI 24.07 kg/m   Physical Exam Vitals and nursing note reviewed.  Constitutional:      Appearance: Normal appearance. She is well-developed.  HENT:     Head: Normocephalic.      Comments: 2 cm linear laceration left occiput. Eyes:     General: Lids are normal.     Conjunctiva/sclera: Conjunctivae normal.     Pupils: Pupils are equal, round, and reactive to light.     Comments:  PERRL. EOMs intact. No nystagmus. No neglect.   Neck:     Comments: Full flexion/extension and lateral movement of neck fully intact. No bony midline tenderness. No deformities or crepitus.  Cardiovascular:     Rate and Rhythm: Normal rate and regular rhythm.     Pulses: Normal pulses.          Radial pulses are 2+ on the right side and 2+ on the left side.       Dorsalis pedis pulses are 2+ on the right  side and 2+ on the left side.     Heart sounds: Normal heart sounds. No murmur heard.  No friction rub. No gallop.   Pulmonary:     Effort: Pulmonary effort is normal.     Breath sounds: Normal breath sounds.  Chest:       Comments: Tenderness palpation noted to the lateral right chest wall.  No deformity or crepitus noted. Abdominal:     Palpations: Abdomen is soft. Abdomen is not rigid.     Tenderness: There is no abdominal tenderness. There is no guarding.     Comments: Abdomen is soft, non-distended, non-tender. No rigidity, No guarding. No peritoneal signs.  Musculoskeletal:        General: Normal range of motion.     Cervical back: Full passive range of motion without pain.     Comments: No midline T-spine tenderness.  No deformity or crepitus noted.  Tenderness palpation in mid lower lumbar region.  She has tenderness palpation of the lateral aspect of the left hip and proximal left femur.  Extension of left lower extremity intact with any difficulty.  She has some pain with flexion but can lift her leg up and hold it against gravity.  Unable to assess internal and external rotation.  No shortening or rotation of the left lower extremity.  No tenderness to palpation to bilateral shoulders, clavicles, elbows, and wrists. No deformities or crepitus noted. FROM of BUE without difficulty.   Skin:    General: Skin is warm and dry.     Capillary Refill: Capillary refill takes less than 2 seconds.     Comments: Good distal cap refill.  LLE is not dusky in appearance or cool to touch.  Neurological:     Mental Status: She is alert and oriented to person, place, and time.     Comments: Cranial nerves III-XII intact Follows commands, Moves all extremities  5/5 strength to BUE and BLE  Sensation intact throughout all major nerve distributions No slurred speech. No facial droop.   Psychiatric:        Speech: Speech normal.     ED Results / Procedures / Treatments   Labs (all labs  ordered are listed, but only abnormal results are displayed) Labs Reviewed  CBC WITH DIFFERENTIAL/PLATELET - Abnormal; Notable for the following components:      Result Value   WBC 12.4 (*)    RBC 5.49 (*)    Hemoglobin 16.2 (*)    HCT 53.0 (*)    RDW 21.4 (*)    Platelets 424 (*)    Neutro Abs 10.9 (*)    Lymphs Abs 0.4 (*)    Abs Immature Granulocytes 0.13 (*)    All other components within normal limits  COMPREHENSIVE METABOLIC PANEL - Abnormal; Notable for the following components:   Glucose, Bld 220 (*)    AST 60 (*)    Total Bilirubin 2.4 (*)    All other components within normal  limits  SARS CORONAVIRUS 2 BY RT PCR (HOSPITAL ORDER, [facility])  APTT  PROTIME-INR  CBC  BASIC METABOLIC PANEL  MAGNESIUM  PHOSPHORUS    EKG None  Radiology DG Chest 2 View  Result Date: 11/04/2019 CLINICAL DATA:  Fall. EXAM: CHEST - 2 VIEW COMPARISON:  June 24, 2018. FINDINGS: Stable cardiomediastinal silhouette. Hiatal hernia is noted. No pneumothorax or pleural effusion is noted. Both lungs are clear. The visualized skeletal structures are unremarkable. IMPRESSION: No active cardiopulmonary disease. Hiatal hernia. Electronically Signed   By: Marijo Conception M.D.   On: 11/04/2019 13:20   DG Pelvis 1-2 Views  Result Date: 11/04/2019 CLINICAL DATA:  Fall EXAM: PELVIS - 1-2 VIEW COMPARISON:  None. FINDINGS: Osteopenia. There is no evidence of displaced pelvic fracture or diastasis. No pelvic bone lesions are seen. Possible bladder calculus or calcified uterine fibroid in the low left hemipelvis. IMPRESSION: Osteopenia. No acute displaced fracture or dislocation of the pelvis. Please note that plain radiographs are insensitive for hip and pelvic fracture, particularly in the setting of osteopenia. Consider CT or MRI to more sensitively evaluate for fracture if there is high clinical suspicion. Electronically Signed   By: Eddie Candle M.D.   On: 11/04/2019 13:22    CT Head Wo Contrast  Result Date: 11/04/2019 CLINICAL DATA:  Head trauma, headache. Poly trauma, critical, head/cervical spine injury suspected. Additional history provided: Mechanical fall landing on back, laceration to back of head. EXAM: CT HEAD WITHOUT CONTRAST CT CERVICAL SPINE WITHOUT CONTRAST TECHNIQUE: Multidetector CT imaging of the head and cervical spine was performed following the standard protocol without intravenous contrast. Multiplanar CT image reconstructions of the cervical spine were also generated. COMPARISON:  Brain MRI 07/19/2018, head CT 07/01/2017, cervical spine MRI 05/04/2005. FINDINGS: CT HEAD FINDINGS Brain: There is acute extra-axial hemorrhage along the inferolateral right temporal lobe measuring up to 11 mm in thickness (for instance as seen on series 5, image 33 and series 3, image 8). Additionally, there is subtle hemorrhagic parenchymal contusion within the anterior right temporal lobe (series 3, image 5) (series 5, image 21). Trace acute subdural hemorrhage is also questioned along the right tentorium. Hemorrhagic parenchymal contusions within the anteroinferior right frontal lobe with overlying small volume acute subarachnoid hemorrhage (greater on the right). Stable moderate chronic small vessel ischemic changes within the cerebral white matter. Redemonstrated chronic right thalamic lacunar infarct. Stable mild generalized parenchymal atrophy. No evidence of intracranial mass. There is no hydrocephalus or midline shift. Vascular: No hyperdense vessel.  Atherosclerotic calcifications. Skull: There is a nondisplaced acute fracture within the left occipital calvarium extending inferiorly to the level of the foramen magnum. Sinuses/Orbits: Visualized orbits show no acute finding. No significant paranasal sinus disease or mastoid effusion at the imaged levels. CT CERVICAL SPINE FINDINGS Alignment: Straightening of the expected cervical lordosis. Trace C4-C5 grade 1  anterolisthesis. There is leftward rotation of C1 upon C2, which may be related to patient head positioning at the time of examination. Skull base and vertebrae: The basion-dental and atlanto-dental intervals are maintained.No evidence of acute fracture to the cervical spine. Congenital nonunion of the posterior arch of C1. Soft tissues and spinal canal: No prevertebral fluid or swelling. No visible canal hematoma. Disc levels: Mild for age cervical spondylosis. No high-grade bony spinal canal narrowing. Upper chest: No consolidation within the imaged lung apices. No visible pneumothorax. These results were called by telephone at the time of interpretation on 11/04/2019 at 3:51 pm to provider [facility]  Arne Schlender , who verbally acknowledged these results. IMPRESSION: CT head: 1. Hemorrhagic parenchymal contusions within the anteroinferior right frontal lobe with overlying small volume acute subarachnoid hemorrhage. 2. Subtle hemorrhagic parenchymal contusion is also present within the anterior right temporal lobe. 3. Acute extra-axial hemorrhage overlying the inferolateral right temporal lobe measuring up to 11 mm in thickness. Additionally, there is suspected trace acute subdural hemorrhage along the right tentorium. 4. Acute nondisplaced fracture of the left occipital calvarium extending inferiorly to the level of the foramen magnum. 5. Unchanged mild generalized parenchymal atrophy with moderate chronic small vessel ischemic disease. Redemonstrated chronic right thalamic lacunar infarct. CT cervical spine: 1. No evidence of acute fracture to the cervical spine. 2. Leftward rotation of C1 upon C2, which may be related to patient head positioning at the time of examination. Clinical correlation is recommended. 3. Mild for age cervical spondylosis. 4. Mild C4-C5 grade 1 anterolisthesis. Electronically Signed   By: Kellie Simmering DO   On: 11/04/2019 15:52   CT Cervical Spine Wo Contrast  Result Date: 11/04/2019 CLINICAL  DATA:  Head trauma, headache. Poly trauma, critical, head/cervical spine injury suspected. Additional history provided: Mechanical fall landing on back, laceration to back of head. EXAM: CT HEAD WITHOUT CONTRAST CT CERVICAL SPINE WITHOUT CONTRAST TECHNIQUE: Multidetector CT imaging of the head and cervical spine was performed following the standard protocol without intravenous contrast. Multiplanar CT image reconstructions of the cervical spine were also generated. COMPARISON:  Brain MRI 07/19/2018, head CT 07/01/2017, cervical spine MRI 05/04/2005. FINDINGS: CT HEAD FINDINGS Brain: There is acute extra-axial hemorrhage along the inferolateral right temporal lobe measuring up to 11 mm in thickness (for instance as seen on series 5, image 33 and series 3, image 8). Additionally, there is subtle hemorrhagic parenchymal contusion within the anterior right temporal lobe (series 3, image 5) (series 5, image 21). Trace acute subdural hemorrhage is also questioned along the right tentorium. Hemorrhagic parenchymal contusions within the anteroinferior right frontal lobe with overlying small volume acute subarachnoid hemorrhage (greater on the right). Stable moderate chronic small vessel ischemic changes within the cerebral white matter. Redemonstrated chronic right thalamic lacunar infarct. Stable mild generalized parenchymal atrophy. No evidence of intracranial mass. There is no hydrocephalus or midline shift. Vascular: No hyperdense vessel.  Atherosclerotic calcifications. Skull: There is a nondisplaced acute fracture within the left occipital calvarium extending inferiorly to the level of the foramen magnum. Sinuses/Orbits: Visualized orbits show no acute finding. No significant paranasal sinus disease or mastoid effusion at the imaged levels. CT CERVICAL SPINE FINDINGS Alignment: Straightening of the expected cervical lordosis. Trace C4-C5 grade 1 anterolisthesis. There is leftward rotation of C1 upon C2, which may be  related to patient head positioning at the time of examination. Skull base and vertebrae: The basion-dental and atlanto-dental intervals are maintained.No evidence of acute fracture to the cervical spine. Congenital nonunion of the posterior arch of C1. Soft tissues and spinal canal: No prevertebral fluid or swelling. No visible canal hematoma. Disc levels: Mild for age cervical spondylosis. No high-grade bony spinal canal narrowing. Upper chest: No consolidation within the imaged lung apices. No visible pneumothorax. These results were called by telephone at the time of interpretation on 11/04/2019 at 3:51 pm to provider [facility] , who verbally acknowledged these results. IMPRESSION: CT head: 1. Hemorrhagic parenchymal contusions within the anteroinferior right frontal lobe with overlying small volume acute subarachnoid hemorrhage. 2. Subtle hemorrhagic parenchymal contusion is also present within the anterior right temporal lobe. 3. Acute extra-axial hemorrhage overlying  the inferolateral right temporal lobe measuring up to 11 mm in thickness. Additionally, there is suspected trace acute subdural hemorrhage along the right tentorium. 4. Acute nondisplaced fracture of the left occipital calvarium extending inferiorly to the level of the foramen magnum. 5. Unchanged mild generalized parenchymal atrophy with moderate chronic small vessel ischemic disease. Redemonstrated chronic right thalamic lacunar infarct. CT cervical spine: 1. No evidence of acute fracture to the cervical spine. 2. Leftward rotation of C1 upon C2, which may be related to patient head positioning at the time of examination. Clinical correlation is recommended. 3. Mild for age cervical spondylosis. 4. Mild C4-C5 grade 1 anterolisthesis. Electronically Signed   By: Kellie Simmering DO   On: 11/04/2019 15:52   CT LUMBAR SPINE WO CONTRAST  Result Date: 11/04/2019 CLINICAL DATA:  Low back and left leg pain after falling. History of spinal  augmentation. EXAM: CT LUMBAR SPINE WITHOUT CONTRAST TECHNIQUE: Multidetector CT imaging of the lumbar spine was performed without intravenous contrast administration. Multiplanar CT image reconstructions were also generated. COMPARISON:  CT lumbar spine 09/10/2007. Lumbar MRI 10/27/2016. Lumbar radiographs 09/04/2019. FINDINGS: Segmentation: There are 5 lumbar type vertebral bodies. The bones are severely demineralized. Alignment: Normal. Vertebrae: No definite acute osseous findings are seen. The L2 vertebral body has a stable appearance post spinal augmentation. Cement extension into the L2-3 disc is unchanged. There are grossly stable biconcave L3 and superior endplate compression deformities at L4. Paraspinal and other soft tissues: No acute paraspinal findings. Diffuse aortic and branch vessel atherosclerosis and hepatomegaly are noted. Disc levels: No large disc herniation or acute disc space findings are identified. Chronic osseous retropulsion at the L2 compression fracture appears stable. There is stable disc bulging, endplate osteophytes and facet hypertrophy in the lower lumbar spine, contributing to stable mild foraminal narrowing. IMPRESSION: 1. No acute osseous findings in the lumbar spine. 2. Stable chronic compression deformities at L2, L3 and L4. 3. Aortic Atherosclerosis (ICD10-I70.0). Electronically Signed   By: Richardean Sale M.D.   On: 11/04/2019 15:28   CT PELVIS WO CONTRAST  Result Date: 11/04/2019 CLINICAL DATA:  Low back and left leg pain after falling. History of spinal augmentation. EXAM: CT PELVIS WITHOUT CONTRAST TECHNIQUE: Multidetector CT imaging of the pelvis was performed following the standard protocol without intravenous contrast. COMPARISON:  Pelvic radiographs today.  Pelvic CT 07/01/2017. FINDINGS: Urinary Tract: The visualized distal ureters and bladder appear unremarkable. Bowel: No bowel wall thickening, distention or surrounding inflammation identified within the  pelvis. Mild sigmoid colon diverticular changes. Vascular/Lymphatic: No enlarged pelvic lymph nodes identified. Diffuse aortoiliac atherosclerosis. Reproductive: The uterus and ovaries appear stable. There is a calcified anterior uterine fibroid. No adnexal mass. Other: No pelvic ascites. Musculoskeletal: There is new asymmetric enlargement of the left piriformis muscle (image 24/3), likely indicative of an acute hematoma. There is chronic atrophy of the right piriformis muscle. No other muscular abnormalities or hematomas are seen. The bones are diffusely demineralized. No evidence of acute fracture or dislocation. Stable sclerosis posteriorly in both iliac bones and stable chronic posttraumatic deformities of both pubic rami. There is also deformity of the lower sacrum on the sagittal images which appears new compared with prior CT, although demonstrates no definite acute features. IMPRESSION: 1. New asymmetric enlargement of the left piriformis muscle, likely indicative of an acute hematoma. 2. No evidence of acute fracture or dislocation. Age indeterminate lower sacral deformity, not likely acute. 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 3. Aortic Atherosclerosis (ICD10-I70.0). Electronically Signed   By:  Richardean Sale M.D.   On: 11/04/2019 15:35   DG Femur Min 2 Views Left  Result Date: 11/04/2019 CLINICAL DATA:  Left leg pain secondary to a fall today. EXAM: LEFT FEMUR 2 VIEWS COMPARISON:  None. FINDINGS: There is no evidence of fracture or other focal bone lesions. Left knee arthroplasty. No evidence of loosening of the components. Soft tissues are unremarkable. IMPRESSION: No acute abnormality. Electronically Signed   By: Lorriane Shire M.D.   On: 11/04/2019 13:22    Procedures .Critical Care Performed by: Volanda Napoleon, PA-C Authorized by: Volanda Napoleon, PA-C   Critical care provider statement:    Critical care time (minutes):  45   Critical care was necessary to treat  or prevent imminent or life-threatening deterioration of the following conditions:  Trauma and CNS failure or compromise   Critical care was time spent personally by me on the following activities:  Discussions with consultants, evaluation of patient's response to treatment, examination of patient, ordering and performing treatments and interventions, ordering and review of laboratory studies, ordering and review of radiographic studies, pulse oximetry, re-evaluation of patient's condition, obtaining history from patient or surrogate and review of old charts .Marland KitchenLaceration Repair  Date/Time: 11/04/2019 5:49 PM Performed by: Volanda Napoleon, PA-C Authorized by: Volanda Napoleon, PA-C   Consent:    Consent obtained:  Emergent situation   Consent given by:  Patient   Risks discussed:  Infection, need for additional repair, pain, poor cosmetic result and poor wound healing   Alternatives discussed:  No treatment and delayed treatment Universal protocol:    Procedure explained and questions answered to patient or proxy's satisfaction: yes     Relevant documents present and verified: yes     Test results available and properly labeled: yes     Imaging studies available: yes     Required blood products, implants, devices, and special equipment available: yes     Site/side marked: yes     Immediately prior to procedure, a time out was called: yes   Laceration details:    Location:  Scalp   Scalp location:  Occipital   Length (cm):  1.5 Repair type:    Repair type:  Intermediate Exploration:    Hemostasis achieved with:  Direct pressure   Wound exploration: wound explored through full range of motion     Wound extent: no foreign bodies/material noted   Treatment:    Area cleansed with:  Saline   Amount of cleaning:  Extensive   Irrigation solution:  Sterile saline   Irrigation method:  Syringe   Visualized foreign bodies/material removed: no   Skin repair:    Repair method:  Staples    Number of staples:  4 Approximation:    Approximation:  Close Post-procedure details:    Patient tolerance of procedure:  Tolerated well, no immediate complications   (including critical care time)  Medications Ordered in ED Medications  ondansetron (ZOFRAN) injection 4 mg (4 mg Intravenous Not Given 11/04/19 1216)  polyethylene glycol (MIRALAX / GLYCOLAX) packet 17 g (has no administration in time range)  docusate sodium (COLACE) capsule 100 mg (has no administration in time range)  morphine 4 MG/ML injection 4 mg (4 mg Intravenous Given 11/04/19 1213)  fentaNYL (SUBLIMAZE) injection 50 mcg (25 mcg Intravenous Given 11/04/19 1343)  levETIRAcetam (KEPPRA) IVPB 1000 mg/100 mL premix (0 mg Intravenous Stopped 11/04/19 1642)  LORazepam (ATIVAN) injection 1 mg (1 mg Intravenous Given 11/04/19 1641)    ED Course  I have reviewed the triage vital signs and the nursing notes.  Pertinent labs & imaging results that were available during my care of the patient were reviewed by me and considered in my medical decision making (see chart for details).    MDM Rules/Calculators/A&P                          86 year old female brought in by EMS for evaluation of mechanical fall.  She reports that she was talking to somebody while she was try to walk and tripped over the curb, falling backwards.  She did hit her head but does not think she had any LOC.  She is on aspirin.  No other blood thinners.  She is complaining of left hip pain, right-sided chest wall pain.  On initial arrival, she is afebrile, nontoxic-appearing.  Vital signs are stable.  She is neurovascularly intact.  Plan for imaging of her head, neck, chest, leg.  She does not know when her last tetanus shot was.  I reviewed her records that showed that she got a tetanus in 2018.  CT informing that they were not able to capture CT scans because patient had a lot of pain while laying down flat.  I discussed with patient, will plan to give her  additional analgesics in order to get CT scans.  Chest x-ray negative for any acute bony abnormality.  Pelvic x-ray shows no acute displaced fracture or dislocation.  Femur x-ray negative for any acute bony abnormality.  We will add additional CT pelvis.  CT L-spine shows no acute bony abnormalities.  She has stable chronic compression fractures at L2, L3 and L4.  CT pelvis shows new asymmetric enlargement of the left piriformis muscle.  Likely indicative of acute hematoma.  No evidence of acute fracture dislocation.  CT head shows hemorrhagic parenchymal contusions within the anterior inferior right frontal lobe with overlying small volume acute subarachnoid hemorrhage.  She has a subtle hemorrhagic parenchymal contusion in the right anterior temporal lobe.  She has acute extra-axial hemorrhage overlying the inferior lateral right temporal lobe measuring 11 mm in thickness.  There is a suspected trace acute subdural hemorrhage along the right tentorium.  She has an acute nondisplaced fracture of the left occipital calvarium extending inferiorly to the level of the foramen magnum.  No evidence of acute cervical spine abnormality.  I updated patient and family on plan.  I was called to the room as patient was having an active seizure.  Patient with no prior history of seizures.  She was given Ativan, Keppra.  This lasted for about a minute with full tonic-clonic seizing activity.  She then became postictal.  Discussed patient with Dr. Ronnald Ramp (Neurosurgery). He recommends medicine admission. He will plan to consult. No other acute imaging needed. Patient does not need C-Collar. Will need repeat imaging tomorrow.  Discussed patient with Critical Care who will come evaluate patient in the ED.   Discussed patient with Dr. Learta [facility]) who accepts patient for admission.    Portions of this note were generated with [employer]. Dictation errors may occur despite best attempts at  proofreading.  Final Clinical Impression(s) / ED Diagnoses Final diagnoses:  Back pain  Intraparenchymal hemorrhage of brain ([place])  Subarachnoid hemorrhage ([place])  Fall, initial encounter  Hematoma of left hip, initial encounter    Rx / DC Orders ED Discharge Orders    None       Providence Lanius  A, PA-C 11/04/19 1803    Blanchie Dessert, MD 11/06/19 [phone]

## 2019-11-04 NOTE — ED Notes (Signed)
Pt transported to CT ?

## 2019-11-04 NOTE — Consult Note (Signed)
Reason for Consult: sdh, hemorrhagic contusion Referring Physician: edp  Rianne Degraaf is an 86 y.o. female.   HPI:  86 year old female presented to the ED today [place] She was apparently talking to a friend when she tripped over the curb. Her grandson accompanies her today. She is postictal and just received ativan one hour ago so history is not obtainable from the patient. She had a seizure about one hour ago that the grandson describes as tonic clonic.   Past Medical History:  Diagnosis Date  . CAD (coronary artery disease)    mild nonobstructive disease on cath in 2003  . Cancer ([place])   . Cardiomyopathy    Probable Takotsubo, severe CP w/ normal cath in 1994. Severe CP in 2003 w/ widespread T wave inversions on ECG. Cath w/ minimal coronary disease but LV-gram showed periapical severe hypokinesis and basilar hyperkinesia (EF 40%). Last echo in 4/09 confirmed full LV functional recovery with EF 60%, no regional wall motion abnormalities, mild to moderate LVH.  Marland Kitchen Chronic diastolic CHF (congestive heart failure) ([place]) 09/13/2019  . CVA (cerebral infarction)   . Depression with anxiety 03-29-11   lost husband 3'09  . Diverticulitis   . DVT (deep venous thrombosis) ([place])    after venous ablation, R leg  . E. coli gastroenteritis 03-29-11   8'10  . Fracture 09/10/07   L2, status post vertebroplasty of L2 performed by IR  . GERD (gastroesophageal reflux disease)   . Headache(784.0)    migraines  . Hemorrhoids   . Hiatal hernia 03-29-11   no nerve problems  . Hyperlipidemia   . Hyperplastic colon polyp 06/2001  . Hypertension   . Iron deficiency anemia due to chronic blood loss 12/30/2017  . OA (osteoarthritis) of knee 03-29-11   w/ bilateral knee pain-not a problem now  . OSA (obstructive sleep apnea) 03-29-11   no cpap used, not a problem now.  . Osteoporosis   . Otalgia of both ears    Dr. Simeon Craft  . Polycythemia   . Stroke [facility])  03-29-11   CVA x2 -last 10'12-?TIA(visual problems)  . Varicose vein     Past Surgical History:  Procedure Laterality Date  . APPENDECTOMY    . BACK SURGERY    . BREAST BIOPSY    . BREAST CYST EXCISION    . BREAST LUMPECTOMY Left 2013   left stage I left breast cancer  . CHOLECYSTECTOMY  04/09/2011   Procedure: LAPAROSCOPIC CHOLECYSTECTOMY WITH INTRAOPERATIVE CHOLANGIOGRAM;  Surgeon: Odis Hollingshead, MD;  Location: [facility];  Service: General;  Laterality: N/A;  . Dental Extraction     L maxillary molar  . IR KYPHO THORACIC WITH BONE BIOPSY  09/15/2019  . KYPHOSIS SURGERY  08/2007   cement used  . OVARIAN CYST SURGERY     left  . TONSILLECTOMY    . TOTAL KNEE ARTHROPLASTY  06/2010   left  . TUBAL LIGATION      Allergies  Allergen Reactions  . Penicillins Hives, Rash and Other (See Comments)    Has patient had a PCN reaction causing immediate rash, facial/tongue/throat swelling, SOB or lightheadedness with hypotension: Yes Has patient had a PCN reaction causing severe rash involving mucus membranes or skin necrosis: yes Has patient had a PCN reaction that required hospitalization: No Has patient had a PCN reaction occurring within the last 10 years: no If all of the above answers are "NO", then may proceed with  Cephalosporin use.  Other Reaction: OTHER REACTION  . Valsartan Itching and Other (See Comments)  . Atorvastatin Diarrhea  . Carvedilol Other (See Comments)    "teeth hurt when I took it"   . Ezetimibe Nausea Only  . Fluvastatin Sodium Other (See Comments)    "Can't remember"  . Magnesium Hydroxide Other (See Comments)    REACTION: triggers HAs  . Meloxicam Other (See Comments)    Pt seeing auro's / spots.    . Pneumovax [Pneumococcal Polysaccharide Vaccine] Other (See Comments)    Local reaction  . Quinapril Hcl Other (See Comments)     "feelings of tiredness"  . Simvastatin Diarrhea  . Topamax [Topiramate] Itching  . Vit D-Vit E-Safflower Oil Other (See  Comments)    Headaches    Social History   Tobacco Use  . Smoking status: Former Smoker    Packs/day: 1.00    Years: 37.00    Pack years: 37.00    Types: Cigarettes    Start date: 06/26/1949    Quit date: 05/14/1988    Years since quitting: 31.4  . Smokeless tobacco: Never Used  . Tobacco comment: quit 25 years ago  Substance Use Topics  . Alcohol use: Yes    Alcohol/week: 1.0 standard drink    Types: 1 Glasses of wine per week    Comment: occasional/social    Family History  Problem Relation Age of Onset  . Breast cancer Mother   . Heart disease Father        MIs  . Colon cancer Other        cousin  . Breast cancer Sister   . Leukemia Brother        GM     Review of Systems  Positive ROS: as above  All other systems have been reviewed and were otherwise negative with the exception of those mentioned in the HPI and as above.  Objective: Vital signs in last 24 hours: Temp:  [97.8 F (36.6 C)] 97.8 F (36.6 C) (06/23 1201) Pulse Rate:  [67-94] 85 (06/23 1715) Resp:  [16-23] 18 (06/23 1715) BP: (143-169)/(62-117) 152/62 (06/23 1715) SpO2:  [92 %-100 %] 92 % (06/23 1715) Weight:  [67.6 kg] 67.6 kg (06/23 1152)  General Appearance: lethargic, cooperative, no distress, appears stated age Head: Normocephalic, without obvious abnormality, atraumatic Eyes: PERRL, conjunctiva/corneas clear, EOM's intact, fundi benign, both eyes      Neck: Supple, symmetrical, trachea midline, no adenopathy; thyroid: No enlargement/tenderness/nodules; no carotid bruit or JVD, c collar. Lungs:  respirations unlabored Heart: Regular rate and rhythm Skin: Skin color, texture, turgor normal, no rashes or lesions  NEUROLOGIC:   Mental status: lethargic, postictal Motor Exam - grossly normal, normal tone and bulk Sensory Exam - grossly normal Reflexes: symmetric, no pathologic reflexes, No Hoffman's, No clonus Coordination - unable to test Gait - unable to test Balance - unable to  test Cranial Nerves: I: smell Not tested  II: visual acuity  OS: na    OD: na  II: visual fields Full to confrontation  II: pupils Equal, round, reactive to light  III,VII: ptosis None  III,IV,VI: extraocular muscles  Full ROM  V: mastication uta  V: facial light touch sensation  uta  V,VII: corneal reflex  uta  VII: facial muscle function - upper  uta  VII: facial muscle function - lower uta  VIII: hearing uta  IX: soft palate elevation  uta  IX,X: gag reflex uta  XI: trapezius strength  uta  XI:  sternocleidomastoid strength uta  XI: neck flexion strength  uta  XII: tongue strength  uta    Data Review Lab Results  Component Value Date   WBC 12.4 (H) 11/04/2019   HGB 16.2 (H) 11/04/2019   HCT 53.0 (H) 11/04/2019   MCV 96.5 11/04/2019   PLT 424 (H) 11/04/2019   Lab Results  Component Value Date   NA 135 10/29/2019   K 4.3 10/29/2019   CL 99 10/29/2019   CO2 30 10/29/2019   BUN 6 (L) 10/29/2019   CREATININE 0.73 10/29/2019   GLUCOSE 116 (H) 10/29/2019   Lab Results  Component Value Date   INR 1.0 09/14/2019    Radiology: DG Chest 2 View  Result Date: 11/04/2019 CLINICAL DATA:  Fall. EXAM: CHEST - 2 VIEW COMPARISON:  June 24, 2018. FINDINGS: Stable cardiomediastinal silhouette. Hiatal hernia is noted. No pneumothorax or pleural effusion is noted. Both lungs are clear. The visualized skeletal structures are unremarkable. IMPRESSION: No active cardiopulmonary disease. Hiatal hernia. Electronically Signed   By: Marijo Conception M.D.   On: 11/04/2019 13:20   DG Pelvis 1-2 Views  Result Date: 11/04/2019 CLINICAL DATA:  Fall EXAM: PELVIS - 1-2 VIEW COMPARISON:  None. FINDINGS: Osteopenia. There is no evidence of displaced pelvic fracture or diastasis. No pelvic bone lesions are seen. Possible bladder calculus or calcified uterine fibroid in the low left hemipelvis. IMPRESSION: Osteopenia. No acute displaced fracture or dislocation of the pelvis. Please note that plain  radiographs are insensitive for hip and pelvic fracture, particularly in the setting of osteopenia. Consider CT or MRI to more sensitively evaluate for fracture if there is high clinical suspicion. Electronically Signed   By: Eddie Candle M.D.   On: 11/04/2019 13:22   CT Head Wo Contrast  Result Date: 11/04/2019 CLINICAL DATA:  Head trauma, headache. Poly trauma, critical, head/cervical spine injury suspected. Additional history provided: Mechanical fall landing on back, laceration to back of head. EXAM: CT HEAD WITHOUT CONTRAST CT CERVICAL SPINE WITHOUT CONTRAST TECHNIQUE: Multidetector CT imaging of the head and cervical spine was performed following the standard protocol without intravenous contrast. Multiplanar CT image reconstructions of the cervical spine were also generated. COMPARISON:  Brain MRI 07/19/2018, head CT 07/01/2017, cervical spine MRI 05/04/2005. FINDINGS: CT HEAD FINDINGS Brain: There is acute extra-axial hemorrhage along the inferolateral right temporal lobe measuring up to 11 mm in thickness (for instance as seen on series 5, image 33 and series 3, image 8). Additionally, there is subtle hemorrhagic parenchymal contusion within the anterior right temporal lobe (series 3, image 5) (series 5, image 21). Trace acute subdural hemorrhage is also questioned along the right tentorium. Hemorrhagic parenchymal contusions within the anteroinferior right frontal lobe with overlying small volume acute subarachnoid hemorrhage (greater on the right). Stable moderate chronic small vessel ischemic changes within the cerebral white matter. Redemonstrated chronic right thalamic lacunar infarct. Stable mild generalized parenchymal atrophy. No evidence of intracranial mass. There is no hydrocephalus or midline shift. Vascular: No hyperdense vessel.  Atherosclerotic calcifications. Skull: There is a nondisplaced acute fracture within the left occipital calvarium extending inferiorly to the level of the foramen  magnum. Sinuses/Orbits: Visualized orbits show no acute finding. No significant paranasal sinus disease or mastoid effusion at the imaged levels. CT CERVICAL SPINE FINDINGS Alignment: Straightening of the expected cervical lordosis. Trace C4-C5 grade 1 anterolisthesis. There is leftward rotation of C1 upon C2, which may be related to patient head positioning at the time of examination. Skull base and vertebrae:  The basion-dental and atlanto-dental intervals are maintained.No evidence of acute fracture to the cervical spine. Congenital nonunion of the posterior arch of C1. Soft tissues and spinal canal: No prevertebral fluid or swelling. No visible canal hematoma. Disc levels: Mild for age cervical spondylosis. No high-grade bony spinal canal narrowing. Upper chest: No consolidation within the imaged lung apices. No visible pneumothorax. These results were called by telephone at the time of interpretation on 11/04/2019 at 3:51 pm to provider [facility] , who verbally acknowledged these results. IMPRESSION: CT head: 1. Hemorrhagic parenchymal contusions within the anteroinferior right frontal lobe with overlying small volume acute subarachnoid hemorrhage. 2. Subtle hemorrhagic parenchymal contusion is also present within the anterior right temporal lobe. 3. Acute extra-axial hemorrhage overlying the inferolateral right temporal lobe measuring up to 11 mm in thickness. Additionally, there is suspected trace acute subdural hemorrhage along the right tentorium. 4. Acute nondisplaced fracture of the left occipital calvarium extending inferiorly to the level of the foramen magnum. 5. Unchanged mild generalized parenchymal atrophy with moderate chronic small vessel ischemic disease. Redemonstrated chronic right thalamic lacunar infarct. CT cervical spine: 1. No evidence of acute fracture to the cervical spine. 2. Leftward rotation of C1 upon C2, which may be related to patient head positioning at the time of examination.  Clinical correlation is recommended. 3. Mild for age cervical spondylosis. 4. Mild C4-C5 grade 1 anterolisthesis. Electronically Signed   By: Kellie Simmering DO   On: 11/04/2019 15:52   CT Cervical Spine Wo Contrast  Result Date: 11/04/2019 CLINICAL DATA:  Head trauma, headache. Poly trauma, critical, head/cervical spine injury suspected. Additional history provided: Mechanical fall landing on back, laceration to back of head. EXAM: CT HEAD WITHOUT CONTRAST CT CERVICAL SPINE WITHOUT CONTRAST TECHNIQUE: Multidetector CT imaging of the head and cervical spine was performed following the standard protocol without intravenous contrast. Multiplanar CT image reconstructions of the cervical spine were also generated. COMPARISON:  Brain MRI 07/19/2018, head CT 07/01/2017, cervical spine MRI 05/04/2005. FINDINGS: CT HEAD FINDINGS Brain: There is acute extra-axial hemorrhage along the inferolateral right temporal lobe measuring up to 11 mm in thickness (for instance as seen on series 5, image 33 and series 3, image 8). Additionally, there is subtle hemorrhagic parenchymal contusion within the anterior right temporal lobe (series 3, image 5) (series 5, image 21). Trace acute subdural hemorrhage is also questioned along the right tentorium. Hemorrhagic parenchymal contusions within the anteroinferior right frontal lobe with overlying small volume acute subarachnoid hemorrhage (greater on the right). Stable moderate chronic small vessel ischemic changes within the cerebral white matter. Redemonstrated chronic right thalamic lacunar infarct. Stable mild generalized parenchymal atrophy. No evidence of intracranial mass. There is no hydrocephalus or midline shift. Vascular: No hyperdense vessel.  Atherosclerotic calcifications. Skull: There is a nondisplaced acute fracture within the left occipital calvarium extending inferiorly to the level of the foramen magnum. Sinuses/Orbits: Visualized orbits show no acute finding. No  significant paranasal sinus disease or mastoid effusion at the imaged levels. CT CERVICAL SPINE FINDINGS Alignment: Straightening of the expected cervical lordosis. Trace C4-C5 grade 1 anterolisthesis. There is leftward rotation of C1 upon C2, which may be related to patient head positioning at the time of examination. Skull base and vertebrae: The basion-dental and atlanto-dental intervals are maintained.No evidence of acute fracture to the cervical spine. Congenital nonunion of the posterior arch of C1. Soft tissues and spinal canal: No prevertebral fluid or swelling. No visible canal hematoma. Disc levels: Mild for age cervical spondylosis. No high-grade  bony spinal canal narrowing. Upper chest: No consolidation within the imaged lung apices. No visible pneumothorax. These results were called by telephone at the time of interpretation on 11/04/2019 at 3:51 pm to provider [facility] , who verbally acknowledged these results. IMPRESSION: CT head: 1. Hemorrhagic parenchymal contusions within the anteroinferior right frontal lobe with overlying small volume acute subarachnoid hemorrhage. 2. Subtle hemorrhagic parenchymal contusion is also present within the anterior right temporal lobe. 3. Acute extra-axial hemorrhage overlying the inferolateral right temporal lobe measuring up to 11 mm in thickness. Additionally, there is suspected trace acute subdural hemorrhage along the right tentorium. 4. Acute nondisplaced fracture of the left occipital calvarium extending inferiorly to the level of the foramen magnum. 5. Unchanged mild generalized parenchymal atrophy with moderate chronic small vessel ischemic disease. Redemonstrated chronic right thalamic lacunar infarct. CT cervical spine: 1. No evidence of acute fracture to the cervical spine. 2. Leftward rotation of C1 upon C2, which may be related to patient head positioning at the time of examination. Clinical correlation is recommended. 3. Mild for age cervical  spondylosis. 4. Mild C4-C5 grade 1 anterolisthesis. Electronically Signed   By: Kellie Simmering DO   On: 11/04/2019 15:52   CT LUMBAR SPINE WO CONTRAST  Result Date: 11/04/2019 CLINICAL DATA:  Low back and left leg pain after falling. History of spinal augmentation. EXAM: CT LUMBAR SPINE WITHOUT CONTRAST TECHNIQUE: Multidetector CT imaging of the lumbar spine was performed without intravenous contrast administration. Multiplanar CT image reconstructions were also generated. COMPARISON:  CT lumbar spine 09/10/2007. Lumbar MRI 10/27/2016. Lumbar radiographs 09/04/2019. FINDINGS: Segmentation: There are 5 lumbar type vertebral bodies. The bones are severely demineralized. Alignment: Normal. Vertebrae: No definite acute osseous findings are seen. The L2 vertebral body has a stable appearance post spinal augmentation. Cement extension into the L2-3 disc is unchanged. There are grossly stable biconcave L3 and superior endplate compression deformities at L4. Paraspinal and other soft tissues: No acute paraspinal findings. Diffuse aortic and branch vessel atherosclerosis and hepatomegaly are noted. Disc levels: No large disc herniation or acute disc space findings are identified. Chronic osseous retropulsion at the L2 compression fracture appears stable. There is stable disc bulging, endplate osteophytes and facet hypertrophy in the lower lumbar spine, contributing to stable mild foraminal narrowing. IMPRESSION: 1. No acute osseous findings in the lumbar spine. 2. Stable chronic compression deformities at L2, L3 and L4. 3. Aortic Atherosclerosis (ICD10-I70.0). Electronically Signed   By: Richardean Sale M.D.   On: 11/04/2019 15:28   CT PELVIS WO CONTRAST  Result Date: 11/04/2019 CLINICAL DATA:  Low back and left leg pain after falling. History of spinal augmentation. EXAM: CT PELVIS WITHOUT CONTRAST TECHNIQUE: Multidetector CT imaging of the pelvis was performed following the standard protocol without intravenous  contrast. COMPARISON:  Pelvic radiographs today.  Pelvic CT 07/01/2017. FINDINGS: Urinary Tract: The visualized distal ureters and bladder appear unremarkable. Bowel: No bowel wall thickening, distention or surrounding inflammation identified within the pelvis. Mild sigmoid colon diverticular changes. Vascular/Lymphatic: No enlarged pelvic lymph nodes identified. Diffuse aortoiliac atherosclerosis. Reproductive: The uterus and ovaries appear stable. There is a calcified anterior uterine fibroid. No adnexal mass. Other: No pelvic ascites. Musculoskeletal: There is new asymmetric enlargement of the left piriformis muscle (image 24/3), likely indicative of an acute hematoma. There is chronic atrophy of the right piriformis muscle. No other muscular abnormalities or hematomas are seen. The bones are diffusely demineralized. No evidence of acute fracture or dislocation. Stable sclerosis posteriorly in both iliac bones and  stable chronic posttraumatic deformities of both pubic rami. There is also deformity of the lower sacrum on the sagittal images which appears new compared with prior CT, although demonstrates no definite acute features. IMPRESSION: 1. New asymmetric enlargement of the left piriformis muscle, likely indicative of an acute hematoma. 2. No evidence of acute fracture or dislocation. Age indeterminate lower sacral deformity, not likely acute. 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 3. Aortic Atherosclerosis (ICD10-I70.0). Electronically Signed   By: Richardean Sale M.D.   On: 11/04/2019 15:35   DG Femur Min 2 Views Left  Result Date: 11/04/2019 CLINICAL DATA:  Left leg pain secondary to a fall today. EXAM: LEFT FEMUR 2 VIEWS COMPARISON:  None. FINDINGS: There is no evidence of fracture or other focal bone lesions. Left knee arthroplasty. No evidence of loosening of the components. Soft tissues are unremarkable. IMPRESSION: No acute abnormality. Electronically Signed   By: Lorriane Shire  M.D.   On: 11/04/2019 13:22     Assessment/Plan: 86 year old female presented to the ED [place] She had a seizure in the ED where she received ativan. She is not post ictal but able to follow some very simple commands. The grandson states that she has always wanted to live for as long as possible and would want any intervention to prolong her life. However, they have never had a conversion about quality of life should something happen. Ct head shows a small right sided temporal lobe sdh as well as a hemorrhagic contusion. Surgical intervention is not warranted at this time. Would recommend repeat head CT in the morning.  Keppra for one week.   Ocie Cornfield Detra Bores 11/04/2019 5:40 PM

## 2019-11-04 NOTE — Consult Note (Deleted)
NAME:  [name], MRN:  [id number], DOB:  1929-05-23, LOS: 0 ADMISSION DATE:  11/04/2019, CONSULTATION DATE:  11/04/2019 REFERRING MD:  EDP, CHIEF COMPLAINT:  Fall, ICH, seizure   Brief History   86 year old with multiple medical issues including Polycythemia vera, breast cancer, CAD, CVA, diverticulitis, GERD, CVA, OSA.  Admitted with mechanical fall after tripping over curb.. Found to have [place], [place] on CT imaging. She is not a candidate for surgical intervention. She had a seizure activity in ED with 1 min of full tonic-clonic activity and given ativan and loaded with keppra. PCCM consulted for admission  Past Medical History    has a past medical history of CAD (coronary artery disease), Cancer ([place]), Cardiomyopathy, Chronic diastolic CHF (congestive heart failure) ([place]) (09/13/2019), CVA (cerebral infarction), Depression with anxiety (03-29-11), Diverticulitis, DVT (deep venous thrombosis) ([place]), E. coli gastroenteritis (03-29-11), Fracture (09/10/07), GERD (gastroesophageal reflux disease), Headache(784.0), Hemorrhoids, Hiatal hernia (03-29-11), Hyperlipidemia, Hyperplastic colon polyp (06/2001), Hypertension, Iron deficiency anemia due to chronic blood loss (12/30/2017), OA (osteoarthritis) of knee (03-29-11), OSA (obstructive sleep apnea) (03-29-11), Osteoporosis, Otalgia of both ears, Polycythemia, Stroke ([place]) (03-29-11), and Varicose vein.  Significant Hospital Events   6/23 Admit  Consults:  Neurosurgery, Neurology  Procedures:    Significant Diagnostic Tests:    Micro Data:    Antimicrobials:    Interim history/subjective:   At time of my admission she has received ativan 1 hr earlier for seizure and was non purposeful  Objective   Blood pressure (!) 152/62, pulse 85, temperature 97.8 F (36.6 C), temperature source Oral, resp. rate 18, height 5\' 6"  (1.676 m), weight 67.6 kg, SpO2 92 %.        Intake/Output Summary (Last 24 hours) at 11/04/2019 1740 Last  data filed at 11/04/2019 1642 Gross per 24 hour  Intake 100 ml  Output --  Net 100 ml   Filed Weights   11/04/19 1152  Weight: 67.6 kg    Examination:. Gen:      No acute distress, C collar HEENT:  EOMI, sclera anicteric Neck:     No masses; no thyromegaly Lungs:    Clear to auscultation bilaterally; normal respiratory effort CV:         Regular rate and rhythm; no murmurs Abd:      + bowel sounds; soft, non-tender; no palpable masses, no distension Ext:    No edema; adequate peripheral perfusion Skin:      Warm and dry; no rash Neuro: Somnolent, moves extremities. Does not respond to commands  Resolved Hospital Problem list     Assessment & Plan:  Mechanical fall ICH, SAH New onset seizures Not a surgical candidate Repeat CT tomorrow per neurosurgery Continue keppra EEG ordered and neuro consulted Admit to 4N at [facility]  Polycythemia vera, stage I ca of left breast s/p lumpectomy in 2013 Followed by onc with regular phlebotomy and IV iron Resume hydroxy urea when able to take PO  Acute encephalopathy due to hemorrhage, seizure Monitor mental status.   HTN Hold lopressor  Best practice:  Diet: NPO Pain/Anxiety/Delirium protocol (if indicated): Fentanyl PRN VAP protocol (if indicated): NA DVT prophylaxis: Avoid heparin. SCDs GI prophylaxis: NA Glucose control: Monitor Mobility: Bed Code Status: Full Family Communication: Discussed with patient's grandson. Family requests full code Disposition: ICU  Labs   CBC: Recent Labs  Lab 10/29/19 1355 11/04/19 1700  WBC 6.9 12.4*  NEUTROABS 5.3 PENDING  HGB 15.7* 16.2*  HCT 50.7* 53.0*  MCV 93.7 96.5  PLT  477* 424*    Basic Metabolic Panel: Recent Labs  Lab 10/29/19 1355  NA 135  K 4.3  CL 99  CO2 30  GLUCOSE 116*  BUN 6*  CREATININE 0.73  CALCIUM 9.9   GFR: Estimated Creatinine Clearance: 45.5 mL/min (by C-G formula based on SCr of 0.73 mg/dL). Recent Labs  Lab 10/29/19 1355  11/04/19 1700  WBC 6.9 12.4*    Liver Function Tests: Recent Labs  Lab 10/29/19 1355  AST 18  ALT 12  ALKPHOS 64  BILITOT 1.6*  PROT 6.5  ALBUMIN 4.2   No results for input(s): LIPASE, AMYLASE in the last 168 hours. No results for input(s): AMMONIA in the last 168 hours.  ABG    Component Value Date/Time   PHART 7.479 (H) 09/07/2007 0014   PCO2ART 34.0 (L) 09/07/2007 0014   PO2ART 59.0 (L) 09/07/2007 0014   HCO3 25.5 (H) 09/07/2007 0014   TCO2 31 07/01/2017 1229   O2SAT 93.0 09/07/2007 0014     Coagulation Profile: No results for input(s): INR, PROTIME in the last 168 hours.  Cardiac Enzymes: No results for input(s): CKTOTAL, CKMB, CKMBINDEX, TROPONINI in the last 168 hours.  HbA1C: No results found for: HGBA1C  CBG: No results for input(s): GLUCAP in the last 168 hours.  Review of Systems:    Unable to obtain due to encephalopathy  Past Medical History  She,  has a past medical history of CAD (coronary artery disease), Cancer ([place]), Cardiomyopathy, Chronic diastolic CHF (congestive heart failure) ([place]) (09/13/2019), CVA (cerebral infarction), Depression with anxiety (03-29-11), Diverticulitis, DVT (deep venous thrombosis) ([place]), E. coli gastroenteritis (03-29-11), Fracture (09/10/07), GERD (gastroesophageal reflux disease), Headache(784.0), Hemorrhoids, Hiatal hernia (03-29-11), Hyperlipidemia, Hyperplastic colon polyp (06/2001), Hypertension, Iron deficiency anemia due to chronic blood loss (12/30/2017), OA (osteoarthritis) of knee (03-29-11), OSA (obstructive sleep apnea) (03-29-11), Osteoporosis, Otalgia of both ears, Polycythemia, Stroke ([place]) (03-29-11), and Varicose vein.   Surgical History    Past Surgical History:  Procedure Laterality Date  . APPENDECTOMY    . BACK SURGERY    . BREAST BIOPSY    . BREAST CYST EXCISION    . BREAST LUMPECTOMY Left 2013   left stage I left breast cancer  . CHOLECYSTECTOMY  04/09/2011   Procedure: LAPAROSCOPIC  CHOLECYSTECTOMY WITH INTRAOPERATIVE CHOLANGIOGRAM;  Surgeon: Odis Hollingshead, MD;  Location: [facility] ORS;  Service: General;  Laterality: N/A;  . Dental Extraction     L maxillary molar  . IR KYPHO THORACIC WITH BONE BIOPSY  09/15/2019  . KYPHOSIS SURGERY  08/2007   cement used  . OVARIAN CYST SURGERY     left  . TONSILLECTOMY    . TOTAL KNEE ARTHROPLASTY  06/2010   left  . TUBAL LIGATION       Social History   reports that she quit smoking about 31 years ago. Her smoking use included cigarettes. She started smoking about 68 years ago. She has a 37.00 pack-year smoking history. She has never used smokeless tobacco. She reports current alcohol use of about 1.0 standard drink of alcohol per week. She reports that she does not use drugs.   Family History   Her family history includes Breast cancer in her mother and sister; Colon cancer in an other family member; Heart disease in her father; Leukemia in her brother.   Allergies Allergies  Allergen Reactions  . Penicillins Hives, Rash and Other (See Comments)    Has patient had a PCN reaction causing immediate rash, facial/tongue/throat swelling, SOB or  lightheadedness with hypotension: Yes Has patient had a PCN reaction causing severe rash involving mucus membranes or skin necrosis: yes Has patient had a PCN reaction that required hospitalization: No Has patient had a PCN reaction occurring within the last 10 years: no If all of the above answers are "NO", then may proceed with Cephalosporin use.  Other Reaction: OTHER REACTION  . Valsartan Itching and Other (See Comments)  . Atorvastatin Diarrhea  . Carvedilol Other (See Comments)    "teeth hurt when I took it"   . Ezetimibe Nausea Only  . Fluvastatin Sodium Other (See Comments)    "Can't remember"  . Magnesium Hydroxide Other (See Comments)    REACTION: triggers HAs  . Meloxicam Other (See Comments)    Pt seeing auro's / spots.    . Pneumovax [Pneumococcal Polysaccharide Vaccine]  Other (See Comments)    Local reaction  . Quinapril Hcl Other (See Comments)     "feelings of tiredness"  . Simvastatin Diarrhea  . Topamax [Topiramate] Itching  . Vit D-Vit E-Safflower Oil Other (See Comments)    Headaches     Home Medications  Prior to Admission medications   Medication Sig Start Date End Date Taking? Authorizing Provider  aspirin 81 MG tablet Take 81 mg by mouth daily.   Yes [provider]  bisacodyl (DULCOLAX) 10 MG suppository Place 1 suppository (10 mg total) rectally daily as needed for moderate constipation. 09/17/19  Yes [name], Laxman, MD  bismuth subsalicylate (PEPTO BISMOL) 262 MG/15ML suspension Take 30 mLs by mouth daily as needed for indigestion or diarrhea or loose stools.    Yes [provider]  docusate sodium (COLACE) 100 MG capsule Take 100 mg by mouth 2 (two) times daily.    Yes [provider]  HYDROcodone-acetaminophen (NORCO/VICODIN) 5-325 MG tablet Take 1 tablet by mouth 2 (two) times daily as needed. Patient taking differently: Take 1 tablet by mouth 2 (two) times daily as needed for moderate pain.  10/08/19  Yes Paz, Alda Berthold, MD  hydroxyurea (HYDREA) 500 MG capsule Take 1 capsule (500 mg total) by mouth every other day. May take with food to minimize GI side effects. 10/05/19  Yes Lassen, Arlo C, PA-C  ibuprofen (ADVIL) 200 MG tablet Take 200 mg by mouth every 6 (six) hours as needed for moderate pain.    Yes [provider]  Lidocaine 4 % PTCH LIDOCAINE 4% PATCH :Apply 1 patch topically daily and remove after 12 hours 10/05/19  Yes Lassen, Arlo C, PA-C  LORazepam (ATIVAN) 0.5 MG tablet TAKE 1/2 TABLET BY MOUTH EVERY MORNING AND EVERY AFTERNOON. THEN TAKE 2 TABLETS BY MOUTH AT BEDTIME AS NEEDED Patient taking differently: Take 0.5 mg by mouth See admin instructions. Take 1/2 tablet by mouth every morning and every afternoon. Then take 2 tablets by mouth at bedtime as needed. 10/08/19  Yes Paz, Alda Berthold, MD  metoprolol  tartrate (LOPRESSOR) 50 MG tablet Take 1 tablet (50 mg total) by mouth 2 (two) times daily. 10/09/19  Yes Paz, Alda Berthold, MD  Nutritional Supplements (BOOST BREEZE PO) Take 1 each by mouth daily.    Yes [provider]  omeprazole (PRILOSEC) 20 MG capsule Take 1 capsule (20 mg total) by mouth daily. 10/05/19  Yes Lassen, Arlo C, PA-C  ondansetron (ZOFRAN) 4 MG tablet Take 1 tablet (4 mg total) by mouth every 6 (six) hours as needed for nausea or vomiting. 10/05/19  Yes Oscar La, Arlo C, PA-C  polyethylene glycol (MIRALAX / GLYCOLAX)  17 g packet Take 17 g by mouth daily.   Yes [provider]  tizanidine (ZANAFLEX) 2 MG capsule Take 1 capsule (2 mg total) by mouth 3 (three) times daily as needed for muscle spasms. 10/05/19  Yes Lassen, Arlo C, PA-C  valACYclovir (VALTREX) 500 MG tablet Take 2 tablets at onset of rash and 2 tablets 12 hours later as needed. 02/16/19  Yes [place], Holli Humbles, NP     Critical care time:    The patient is critically ill with multiple organ system failure and requires high complexity decision making for assessment and support, frequent evaluation and titration of therapies, advanced monitoring, review of radiographic studies and interpretation of complex data.   Critical Care Time devoted to patient care services, exclusive of separately billable procedures, described in this note is 35 minutes.   Marshell Garfinkel MD [facility] Please see [email or web address] for pager details.  11/04/2019, 6:34 PM

## 2019-11-05 ENCOUNTER — Inpatient Hospital Stay (HOSPITAL_COMMUNITY): Payer: Medicare Other

## 2019-11-05 DIAGNOSIS — R569 Unspecified convulsions: Secondary | ICD-10-CM

## 2019-11-05 DIAGNOSIS — I10 Essential (primary) hypertension: Secondary | ICD-10-CM

## 2019-11-05 DIAGNOSIS — R4182 Altered mental status, unspecified: Secondary | ICD-10-CM

## 2019-11-05 DIAGNOSIS — R9401 Abnormal electroencephalogram [EEG]: Secondary | ICD-10-CM

## 2019-11-05 DIAGNOSIS — S06360D Traumatic hemorrhage of cerebrum, unspecified, without loss of consciousness, subsequent encounter: Secondary | ICD-10-CM

## 2019-11-05 LAB — CBC WITH DIFFERENTIAL/PLATELET
Abs Immature Granulocytes: 0.06 10*3/uL (ref 0.00–0.07)
Basophils Absolute: 0 10*3/uL (ref 0.0–0.1)
Basophils Relative: 0 %
Eosinophils Absolute: 0 10*3/uL (ref 0.0–0.5)
Eosinophils Relative: 0 %
HCT: 49 % — ABNORMAL HIGH (ref 36.0–46.0)
Hemoglobin: 15.1 g/dL — ABNORMAL HIGH (ref 12.0–15.0)
Immature Granulocytes: 1 %
Lymphocytes Relative: 3 %
Lymphs Abs: 0.3 10*3/uL — ABNORMAL LOW (ref 0.7–4.0)
MCH: 29.4 pg (ref 26.0–34.0)
MCHC: 30.8 g/dL (ref 30.0–36.0)
MCV: 95.3 fL (ref 80.0–100.0)
Monocytes Absolute: 0.8 10*3/uL (ref 0.1–1.0)
Monocytes Relative: 6 %
Neutro Abs: 11 10*3/uL — ABNORMAL HIGH (ref 1.7–7.7)
Neutrophils Relative %: 90 %
Platelets: 385 10*3/uL (ref 150–400)
RBC: 5.14 MIL/uL — ABNORMAL HIGH (ref 3.87–5.11)
RDW: 21.3 % — ABNORMAL HIGH (ref 11.5–15.5)
WBC: 12.2 10*3/uL — ABNORMAL HIGH (ref 4.0–10.5)
nRBC: 0 % (ref 0.0–0.2)

## 2019-11-05 LAB — TROPONIN I (HIGH SENSITIVITY)
Troponin I (High Sensitivity): 11 ng/L (ref <18)
Troponin I (High Sensitivity): 10 ng/L (ref <18)

## 2019-11-05 LAB — COMPREHENSIVE METABOLIC PANEL
ALT: 28 U/L (ref 0–44)
AST: 34 U/L (ref 15–41)
Albumin: 3.6 g/dL (ref 3.5–5.0)
Alkaline Phosphatase: 57 U/L (ref 38–126)
Anion gap: 12 (ref 5–15)
BUN: 9 mg/dL (ref 8–23)
CO2: 24 mmol/L (ref 22–32)
Calcium: 9.4 mg/dL (ref 8.9–10.3)
Chloride: 100 mmol/L (ref 98–111)
Creatinine, Ser: 0.68 mg/dL (ref 0.44–1.00)
GFR calc Af Amer: >60 mL/min (ref >60)
GFR calc non Af Amer: >60 mL/min (ref >60)
Glucose, Bld: 154 mg/dL — ABNORMAL HIGH (ref 70–99)
Potassium: 3.9 mmol/L (ref 3.5–5.1)
Sodium: 136 mmol/L (ref 135–145)
Total Bilirubin: 3 mg/dL — ABNORMAL HIGH (ref 0.3–1.2)
Total Protein: 6.4 g/dL — ABNORMAL LOW (ref 6.5–8.1)

## 2019-11-05 LAB — MRSA PCR SCREENING: MRSA by PCR: NEGATIVE

## 2019-11-05 LAB — GLUCOSE, CAPILLARY
Glucose-Capillary: 101 mg/dL — ABNORMAL HIGH (ref 70–99)
Glucose-Capillary: 154 mg/dL — ABNORMAL HIGH (ref 70–99)

## 2019-11-05 LAB — MAGNESIUM: Magnesium: 2.2 mg/dL (ref 1.7–2.4)

## 2019-11-05 MED ORDER — ORAL CARE MOUTH RINSE
15.0000 mL | Freq: Two times a day (BID) | OROMUCOSAL | Status: DC
  Administered 2019-11-05 – 2019-11-12 (×15): 15 mL via OROMUCOSAL

## 2019-11-05 MED ORDER — FENTANYL CITRATE (PF) 100 MCG/2ML IJ SOLN
25.0000 ug | INTRAMUSCULAR | Status: DC | PRN
  Administered 2019-11-05 (×2): 25 ug via INTRAVENOUS
  Filled 2019-11-05 (×2): qty 2

## 2019-11-05 MED ORDER — OXYCODONE-ACETAMINOPHEN 5-325 MG PO TABS
1.0000 | ORAL_TABLET | ORAL | Status: DC | PRN
  Administered 2019-11-05 – 2019-11-12 (×16): 1 via ORAL
  Filled 2019-11-05 (×18): qty 1

## 2019-11-05 MED ORDER — ONDANSETRON HCL 4 MG/2ML IJ SOLN
INTRAMUSCULAR | Status: AC
  Administered 2019-11-05: 4 mg
  Filled 2019-11-05: qty 2

## 2019-11-05 MED ORDER — ONDANSETRON HCL 4 MG/2ML IJ SOLN
4.0000 mg | Freq: Three times a day (TID) | INTRAMUSCULAR | Status: DC
  Administered 2019-11-05 (×3): 4 mg via INTRAVENOUS
  Filled 2019-11-05 (×2): qty 2

## 2019-11-05 MED ORDER — LABETALOL HCL 5 MG/ML IV SOLN
10.0000 mg | INTRAVENOUS | Status: DC | PRN
  Administered 2019-11-05: 10 mg via INTRAVENOUS
  Administered 2019-11-06 – 2019-11-08 (×2): 20 mg via INTRAVENOUS
  Filled 2019-11-05 (×3): qty 4

## 2019-11-05 MED ORDER — LORAZEPAM 0.5 MG PO TABS
0.2500 mg | ORAL_TABLET | Freq: Two times a day (BID) | ORAL | Status: DC
  Administered 2019-11-05 – 2019-11-12 (×14): 0.25 mg via ORAL
  Filled 2019-11-05 (×14): qty 1

## 2019-11-05 MED ORDER — ONDANSETRON HCL 4 MG/2ML IJ SOLN
4.0000 mg | Freq: Four times a day (QID) | INTRAMUSCULAR | Status: DC
  Administered 2019-11-05 – 2019-11-12 (×26): 4 mg via INTRAVENOUS
  Filled 2019-11-05 (×25): qty 2

## 2019-11-05 MED ORDER — FENTANYL CITRATE (PF) 100 MCG/2ML IJ SOLN
50.0000 ug | INTRAMUSCULAR | Status: DC | PRN
  Administered 2019-11-05 (×3): 50 ug via INTRAVENOUS
  Filled 2019-11-05 (×3): qty 2

## 2019-11-05 MED ORDER — MORPHINE SULFATE (PF) 2 MG/ML IV SOLN
2.0000 mg | INTRAVENOUS | Status: DC | PRN
  Administered 2019-11-05 – 2019-11-08 (×15): 2 mg via INTRAVENOUS
  Filled 2019-11-05 (×15): qty 1

## 2019-11-05 MED ORDER — ALUM & MAG HYDROXIDE-SIMETH 200-200-20 MG/5ML PO SUSP
30.0000 mL | Freq: Four times a day (QID) | ORAL | Status: DC | PRN
  Administered 2019-11-05 – 2019-11-12 (×6): 30 mL via ORAL
  Filled 2019-11-05 (×6): qty 30

## 2019-11-05 MED ORDER — METOPROLOL TARTRATE 25 MG PO TABS
25.0000 mg | ORAL_TABLET | Freq: Two times a day (BID) | ORAL | Status: DC
  Administered 2019-11-05: 25 mg via ORAL
  Filled 2019-11-05: qty 1

## 2019-11-05 NOTE — Progress Notes (Addendum)
Neurology consulted for seizures.  Reviewed EEG, no epileptiform discharges.  Continue Keppra 500mG  BID x 1 month per Dr. Cecil Cobbs note.  Management of traumatic ICH per neurosurgery.  Outpatient neurology follow up in 4 to 6 weeks.

## 2019-11-05 NOTE — Progress Notes (Signed)
Patient ID: [name], female   DOB: [birth year], 86 y.o.   MRN: [id number] Subjective: Patient reports she is having significant mid thoracic back pain.  No leg symptoms.  She does have some headache.  Objective: Vital signs in last 24 hours: Temp:  [97.8 F (36.6 C)-100.3 F (37.9 C)] 100.3 F (37.9 C) (06/24 0306) Pulse Rate:  [67-94] 88 (06/24 0700) Resp:  [16-27] 18 (06/24 0700) BP: (109-169)/(62-117) 146/65 (06/24 0700) SpO2:  [92 %-100 %] 97 % (06/24 0700) Weight:  [67.6 kg-69.7 kg] 69.7 kg (06/24 0154)  Intake/Output from previous day: 06/23 0701 - 06/24 0700 In: 200 [IV Piggyback:200] Out: 300 [Urine:300] Intake/Output this shift: No intake/output data recorded.  Neurologic: Grossly normal, she is much more awake and alert and appropriate.  She has good memory.  She is moving all extremities.  Lab Results: Lab Results  Component Value Date   WBC 12.2 (H) 11/05/2019   HGB 15.1 (H) 11/05/2019   HCT 49.0 (H) 11/05/2019   MCV 95.3 11/05/2019   PLT 385 11/05/2019   Lab Results  Component Value Date   INR 1.1 11/04/2019   BMET Lab Results  Component Value Date   NA 137 11/04/2019   K 4.1 11/04/2019   CL 98 11/04/2019   CO2 25 11/04/2019   GLUCOSE 220 (H) 11/04/2019   BUN 8 11/04/2019   CREATININE 0.70 11/04/2019   CALCIUM 9.4 11/04/2019    Studies/Results: DG Chest 2 View  Result Date: 11/04/2019 CLINICAL DATA:  Fall. EXAM: CHEST - 2 VIEW COMPARISON:  June 24, 2018. FINDINGS: Stable cardiomediastinal silhouette. Hiatal hernia is noted. No pneumothorax or pleural effusion is noted. Both lungs are clear. The visualized skeletal structures are unremarkable. IMPRESSION: No active cardiopulmonary disease. Hiatal hernia. Electronically Signed   By: Marijo Conception M.D.   On: 11/04/2019 13:20   DG Pelvis 1-2 Views  Result Date: 11/04/2019 CLINICAL DATA:  Fall EXAM: PELVIS - 1-2 VIEW COMPARISON:  None. FINDINGS: Osteopenia. There is no evidence of  displaced pelvic fracture or diastasis. No pelvic bone lesions are seen. Possible bladder calculus or calcified uterine fibroid in the low left hemipelvis. IMPRESSION: Osteopenia. No acute displaced fracture or dislocation of the pelvis. Please note that plain radiographs are insensitive for hip and pelvic fracture, particularly in the setting of osteopenia. Consider CT or MRI to more sensitively evaluate for fracture if there is high clinical suspicion. Electronically Signed   By: Eddie Candle M.D.   On: 11/04/2019 13:22   CT HEAD WO CONTRAST  Result Date: 11/05/2019 CLINICAL DATA:  Intracranial hemorrhage.  Subdural hemorrhage. EXAM: CT HEAD WITHOUT CONTRAST TECHNIQUE: Contiguous axial images were obtained from the base of the skull through the vertex without intravenous contrast. COMPARISON:  CT head without contrast 11/04/2019. FINDINGS: Brain: Hemorrhagic contusions involving the inferolateral right temporal lobe and anterior inferior right frontal lobe demonstrate expected evolution and are more evident on today's exam. Hyperdense components are more prominent with some surrounding edema. Subarachnoid hemorrhage is evident at both locations. Subdural blood adjacent to the temporal lobe is stable. Anterior subdural hemorrhage is more visible on today's study extending to anterior falx. No new areas of hemorrhage are present. Moderate atrophy and white matter disease are again seen. The ventricles are of normal size. Vascular: Atherosclerotic calcifications are present within the cavernous internal carotid arteries bilaterally of the right vertebral artery. No hyperdense vessel is present. Skull: Calvarium is intact. Nondisplaced left occipital skull fracture is again seen. Fracture extends to  the foramen magnum. No additional fractures are present. Skin staples are present over the left occipital scalp laceration. Sinuses/Orbits: The paranasal sinuses and mastoid air cells are clear. The globes and orbits  are within normal limits. IMPRESSION: 1. Expected evolution of hemorrhagic contusions involving the inferolateral right temporal lobe and anterior inferior right frontal lobe. 2. Subarachnoid hemorrhage at both locations. 3. Stable subdural blood adjacent to the right temporal lobe. 4. Anterior subdural hemorrhage is more visible on today's study extending to the anterior falx. 5. Stable nondisplaced left occipital skull fracture. 6. Stable atrophy and white matter disease. Electronically Signed   By: San Morelle M.D.   On: 11/05/2019 05:53   CT Head Wo Contrast  Result Date: 11/04/2019 CLINICAL DATA:  Head trauma, headache. Poly trauma, critical, head/cervical spine injury suspected. Additional history provided: Mechanical fall landing on back, laceration to back of head. EXAM: CT HEAD WITHOUT CONTRAST CT CERVICAL SPINE WITHOUT CONTRAST TECHNIQUE: Multidetector CT imaging of the head and cervical spine was performed following the standard protocol without intravenous contrast. Multiplanar CT image reconstructions of the cervical spine were also generated. COMPARISON:  Brain MRI 07/19/2018, head CT 07/01/2017, cervical spine MRI 05/04/2005. FINDINGS: CT HEAD FINDINGS Brain: There is acute extra-axial hemorrhage along the inferolateral right temporal lobe measuring up to 11 mm in thickness (for instance as seen on series 5, image 33 and series 3, image 8). Additionally, there is subtle hemorrhagic parenchymal contusion within the anterior right temporal lobe (series 3, image 5) (series 5, image 21). Trace acute subdural hemorrhage is also questioned along the right tentorium. Hemorrhagic parenchymal contusions within the anteroinferior right frontal lobe with overlying small volume acute subarachnoid hemorrhage (greater on the right). Stable moderate chronic small vessel ischemic changes within the cerebral white matter. Redemonstrated chronic right thalamic lacunar infarct. Stable mild generalized  parenchymal atrophy. No evidence of intracranial mass. There is no hydrocephalus or midline shift. Vascular: No hyperdense vessel.  Atherosclerotic calcifications. Skull: There is a nondisplaced acute fracture within the left occipital calvarium extending inferiorly to the level of the foramen magnum. Sinuses/Orbits: Visualized orbits show no acute finding. No significant paranasal sinus disease or mastoid effusion at the imaged levels. CT CERVICAL SPINE FINDINGS Alignment: Straightening of the expected cervical lordosis. Trace C4-C5 grade 1 anterolisthesis. There is leftward rotation of C1 upon C2, which may be related to patient head positioning at the time of examination. Skull base and vertebrae: The basion-dental and atlanto-dental intervals are maintained.No evidence of acute fracture to the cervical spine. Congenital nonunion of the posterior arch of C1. Soft tissues and spinal canal: No prevertebral fluid or swelling. No visible canal hematoma. Disc levels: Mild for age cervical spondylosis. No high-grade bony spinal canal narrowing. Upper chest: No consolidation within the imaged lung apices. No visible pneumothorax. These results were called by telephone at the time of interpretation on 11/04/2019 at 3:51 pm to provider [facility] , who verbally acknowledged these results. IMPRESSION: CT head: 1. Hemorrhagic parenchymal contusions within the anteroinferior right frontal lobe with overlying small volume acute subarachnoid hemorrhage. 2. Subtle hemorrhagic parenchymal contusion is also present within the anterior right temporal lobe. 3. Acute extra-axial hemorrhage overlying the inferolateral right temporal lobe measuring up to 11 mm in thickness. Additionally, there is suspected trace acute subdural hemorrhage along the right tentorium. 4. Acute nondisplaced fracture of the left occipital calvarium extending inferiorly to the level of the foramen magnum. 5. Unchanged mild generalized parenchymal atrophy  with moderate chronic small vessel ischemic disease. Redemonstrated  chronic right thalamic lacunar infarct. CT cervical spine: 1. No evidence of acute fracture to the cervical spine. 2. Leftward rotation of C1 upon C2, which may be related to patient head positioning at the time of examination. Clinical correlation is recommended. 3. Mild for age cervical spondylosis. 4. Mild C4-C5 grade 1 anterolisthesis. Electronically Signed   By: Kellie Simmering DO   On: 11/04/2019 15:52   CT Cervical Spine Wo Contrast  Result Date: 11/04/2019 CLINICAL DATA:  Head trauma, headache. Poly trauma, critical, head/cervical spine injury suspected. Additional history provided: Mechanical fall landing on back, laceration to back of head. EXAM: CT HEAD WITHOUT CONTRAST CT CERVICAL SPINE WITHOUT CONTRAST TECHNIQUE: Multidetector CT imaging of the head and cervical spine was performed following the standard protocol without intravenous contrast. Multiplanar CT image reconstructions of the cervical spine were also generated. COMPARISON:  Brain MRI 07/19/2018, head CT 07/01/2017, cervical spine MRI 05/04/2005. FINDINGS: CT HEAD FINDINGS Brain: There is acute extra-axial hemorrhage along the inferolateral right temporal lobe measuring up to 11 mm in thickness (for instance as seen on series 5, image 33 and series 3, image 8). Additionally, there is subtle hemorrhagic parenchymal contusion within the anterior right temporal lobe (series 3, image 5) (series 5, image 21). Trace acute subdural hemorrhage is also questioned along the right tentorium. Hemorrhagic parenchymal contusions within the anteroinferior right frontal lobe with overlying small volume acute subarachnoid hemorrhage (greater on the right). Stable moderate chronic small vessel ischemic changes within the cerebral white matter. Redemonstrated chronic right thalamic lacunar infarct. Stable mild generalized parenchymal atrophy. No evidence of intracranial mass. There is no  hydrocephalus or midline shift. Vascular: No hyperdense vessel.  Atherosclerotic calcifications. Skull: There is a nondisplaced acute fracture within the left occipital calvarium extending inferiorly to the level of the foramen magnum. Sinuses/Orbits: Visualized orbits show no acute finding. No significant paranasal sinus disease or mastoid effusion at the imaged levels. CT CERVICAL SPINE FINDINGS Alignment: Straightening of the expected cervical lordosis. Trace C4-C5 grade 1 anterolisthesis. There is leftward rotation of C1 upon C2, which may be related to patient head positioning at the time of examination. Skull base and vertebrae: The basion-dental and atlanto-dental intervals are maintained.No evidence of acute fracture to the cervical spine. Congenital nonunion of the posterior arch of C1. Soft tissues and spinal canal: No prevertebral fluid or swelling. No visible canal hematoma. Disc levels: Mild for age cervical spondylosis. No high-grade bony spinal canal narrowing. Upper chest: No consolidation within the imaged lung apices. No visible pneumothorax. These results were called by telephone at the time of interpretation on 11/04/2019 at 3:51 pm to provider [facility] , who verbally acknowledged these results. IMPRESSION: CT head: 1. Hemorrhagic parenchymal contusions within the anteroinferior right frontal lobe with overlying small volume acute subarachnoid hemorrhage. 2. Subtle hemorrhagic parenchymal contusion is also present within the anterior right temporal lobe. 3. Acute extra-axial hemorrhage overlying the inferolateral right temporal lobe measuring up to 11 mm in thickness. Additionally, there is suspected trace acute subdural hemorrhage along the right tentorium. 4. Acute nondisplaced fracture of the left occipital calvarium extending inferiorly to the level of the foramen magnum. 5. Unchanged mild generalized parenchymal atrophy with moderate chronic small vessel ischemic disease. Redemonstrated  chronic right thalamic lacunar infarct. CT cervical spine: 1. No evidence of acute fracture to the cervical spine. 2. Leftward rotation of C1 upon C2, which may be related to patient head positioning at the time of examination. Clinical correlation is recommended. 3. Mild for age  cervical spondylosis. 4. Mild C4-C5 grade 1 anterolisthesis. Electronically Signed   By: Kellie Simmering DO   On: 11/04/2019 15:52   CT LUMBAR SPINE WO CONTRAST  Result Date: 11/04/2019 CLINICAL DATA:  Low back and left leg pain after falling. History of spinal augmentation. EXAM: CT LUMBAR SPINE WITHOUT CONTRAST TECHNIQUE: Multidetector CT imaging of the lumbar spine was performed without intravenous contrast administration. Multiplanar CT image reconstructions were also generated. COMPARISON:  CT lumbar spine 09/10/2007. Lumbar MRI 10/27/2016. Lumbar radiographs 09/04/2019. FINDINGS: Segmentation: There are 5 lumbar type vertebral bodies. The bones are severely demineralized. Alignment: Normal. Vertebrae: No definite acute osseous findings are seen. The L2 vertebral body has a stable appearance post spinal augmentation. Cement extension into the L2-3 disc is unchanged. There are grossly stable biconcave L3 and superior endplate compression deformities at L4. Paraspinal and other soft tissues: No acute paraspinal findings. Diffuse aortic and branch vessel atherosclerosis and hepatomegaly are noted. Disc levels: No large disc herniation or acute disc space findings are identified. Chronic osseous retropulsion at the L2 compression fracture appears stable. There is stable disc bulging, endplate osteophytes and facet hypertrophy in the lower lumbar spine, contributing to stable mild foraminal narrowing. IMPRESSION: 1. No acute osseous findings in the lumbar spine. 2. Stable chronic compression deformities at L2, L3 and L4. 3. Aortic Atherosclerosis (ICD10-I70.0). Electronically Signed   By: Richardean Sale M.D.   On: 11/04/2019 15:28   CT  PELVIS WO CONTRAST  Result Date: 11/04/2019 CLINICAL DATA:  Low back and left leg pain after falling. History of spinal augmentation. EXAM: CT PELVIS WITHOUT CONTRAST TECHNIQUE: Multidetector CT imaging of the pelvis was performed following the standard protocol without intravenous contrast. COMPARISON:  Pelvic radiographs today.  Pelvic CT 07/01/2017. FINDINGS: Urinary Tract: The visualized distal ureters and bladder appear unremarkable. Bowel: No bowel wall thickening, distention or surrounding inflammation identified within the pelvis. Mild sigmoid colon diverticular changes. Vascular/Lymphatic: No enlarged pelvic lymph nodes identified. Diffuse aortoiliac atherosclerosis. Reproductive: The uterus and ovaries appear stable. There is a calcified anterior uterine fibroid. No adnexal mass. Other: No pelvic ascites. Musculoskeletal: There is new asymmetric enlargement of the left piriformis muscle (image 24/3), likely indicative of an acute hematoma. There is chronic atrophy of the right piriformis muscle. No other muscular abnormalities or hematomas are seen. The bones are diffusely demineralized. No evidence of acute fracture or dislocation. Stable sclerosis posteriorly in both iliac bones and stable chronic posttraumatic deformities of both pubic rami. There is also deformity of the lower sacrum on the sagittal images which appears new compared with prior CT, although demonstrates no definite acute features. IMPRESSION: 1. New asymmetric enlargement of the left piriformis muscle, likely indicative of an acute hematoma. 2. No evidence of acute fracture or dislocation. Age indeterminate lower sacral deformity, not likely acute. 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 3. Aortic Atherosclerosis (ICD10-I70.0). Electronically Signed   By: Richardean Sale M.D.   On: 11/04/2019 15:35   DG Chest Port 1 View  Result Date: 11/05/2019 CLINICAL DATA:  Acute respiratory failure. EXAM: PORTABLE CHEST 1 VIEW  COMPARISON:  CT of the chest 11/04/2019 FINDINGS: Heart is mildly enlarged. Increasing asymmetric left-sided edema and pulmonary vascular congestion is present. Left basilar airspace opacity is noted. IMPRESSION: 1. Increasing asymmetric left-sided edema and pulmonary vascular congestion. 2. Left basilar airspace disease. Electronically Signed   By: San Morelle M.D.   On: 11/05/2019 07:37   DG Femur Min 2 Views Left  Result Date: 11/04/2019 CLINICAL DATA:  Left leg pain secondary to a fall today. EXAM: LEFT FEMUR 2 VIEWS COMPARISON:  None. FINDINGS: There is no evidence of fracture or other focal bone lesions. Left knee arthroplasty. No evidence of loosening of the components. Soft tissues are unremarkable. IMPRESSION: No acute abnormality. Electronically Signed   By: Lorriane Shire M.D.   On: 11/04/2019 13:22    Assessment/Plan: CT scan of the head unchanged.  Neurology managing posttraumatic seizure.  Recommend CT scan of thoracic spine versus MRI since she has had previous compression fractures and kyphoplasty.  Estimated body mass index is 24.8 kg/m as calculated from the following:   Height as of this encounter: 5\' 6"  (1.676 m).   Weight as of this encounter: 69.7 kg.    LOS: 1 day    Eustace Moore 11/05/2019, 7:47 AM

## 2019-11-05 NOTE — Progress Notes (Signed)
[place] Progress Note Patient Name: Mahkayla Preece DOB: Dec 05, 1929 MRN: [id number]   Date of Service  11/05/2019  HPI/Events of Note  Patient is s/p traumatic ICH, blood pressure is sub-optimally controlled.  eICU Interventions  Labetalol 10-20 mg iv Q 2 hours PRN SBP > 140 mmHg.        Frederik Pear 11/05/2019, 10:44 PM

## 2019-11-05 NOTE — Progress Notes (Signed)
[place] Progress Note Patient Name: [name] DOB: January 02, 1930 MRN: [id number]   Date of Service  11/05/2019  HPI/Events of Note  Patient admitted with traumatic ICH, seizures and altered mental status.  eICU Interventions  New Patient Evaluation completed.        Kerry Kass [name] 11/05/2019, 12:08 AM

## 2019-11-05 NOTE — Progress Notes (Signed)
EEG complete - results pending 

## 2019-11-05 NOTE — Progress Notes (Signed)
[place] Progress Note Patient Name: [name] DOB: [birth year] MRN: [id number]   Date of Service  11/05/2019  HPI/Events of Note  Patient  Needs morning labs  Ordered.  eICU Interventions   a.m. labs ordered.        [name] Amandeep Hogston 11/05/2019, 6:20 AM

## 2019-11-05 NOTE — Progress Notes (Signed)
[place] Progress Note Patient Name: [name] DOB: 03-Aug-1929 MRN: [id number]   Date of Service  11/05/2019  HPI/Events of Note  Sub-optimal analgesia  eICU Interventions  Fentanyl order changed to Q 4 hours PRN        Frederik Pear 11/05/2019, 4:24 AM

## 2019-11-05 NOTE — Progress Notes (Signed)
Patient ID: [name], female   DOB: 1930/04/05, 86 y.o.   MRN: [id number] CT of thoracic spine reviewed.  There is a minor T4 compression fracture.  There is no canal stenosis.  Age of this fracture is difficult to determine.  It could be acute or subacute.  Recommend treating in cervical collar.  Would not recommend TLSO with cervical extension.  At 86 years old this may be difficult for her to manage.  Manage pain expectantly.  Could potentially be a kyphoplasty candidate once again.  We will sign off for now.  Please have her follow-up with me 2 weeks after discharge for repeat CT scan of the brain.  Seizure treatment per neurology.

## 2019-11-05 NOTE — Progress Notes (Signed)
Spoke with Dr. Grandville Silos, trauma to see Ms. Roston  ?  Transfer to trauma service ?  discontinuation of C-collar

## 2019-11-05 NOTE — Consult Note (Signed)
Reason for Consult: to clear cervical spine Referring Physician: A. Olalere  [name] is an 86 y.o. female.  HPI: 86 year old female with a history of multiple medical problems including CVA, CHF, CAD, and polycythemia vera managed by hematology [place] and she tripped on a curb.  She fell backwards and struck her head.  She was taken to [facility] long hospital yesterday by EMS.  She had a seizure and in the emergency department which was appropriately treated.  Further work-up revealed a small right-sided temporal lobe subdural hematoma as well as hemorrhagic contusion.  Dr. Ronnald Ramp from neurosurgery consulted and recommended Keppra.  The Neurology team has also been following for further seizure evaluation and management including EEG.  On follow-up today, the patient had further back pain and Dr. Ronnald Ramp ordered a CT scan of her thoracic spine which revealed a T4 fracture.  The patient has a cervical collar in place and I was asked to evaluate for clearance of that.  Past Medical History:  Diagnosis Date  . CAD (coronary artery disease)    mild nonobstructive disease on cath in 2003  . Cancer ([place])   . Cardiomyopathy    Probable Takotsubo, severe CP w/ normal cath in 1994. Severe CP in 2003 w/ widespread T wave inversions on ECG. Cath w/ minimal coronary disease but LV-gram showed periapical severe hypokinesis and basilar hyperkinesia (EF 40%). Last echo in 4/09 confirmed full LV functional recovery with EF 60%, no regional wall motion abnormalities, mild to moderate LVH.  Marland Kitchen Chronic diastolic CHF (congestive heart failure) ([place]) 09/13/2019  . CVA (cerebral infarction)   . Depression with anxiety 03-29-11   lost husband 3'09  . Diverticulitis   . DVT (deep venous thrombosis) ([place])    after venous ablation, R leg  . E. coli gastroenteritis 03-29-11   8'10  . Fracture 09/10/07   L2, status post vertebroplasty of L2 performed by IR  . GERD  (gastroesophageal reflux disease)   . Headache(784.0)    migraines  . Hemorrhoids   . Hiatal hernia 03-29-11   no nerve problems  . Hyperlipidemia   . Hyperplastic colon polyp 06/2001  . Hypertension   . Iron deficiency anemia due to chronic blood loss 12/30/2017  . OA (osteoarthritis) of knee 03-29-11   w/ bilateral knee pain-not a problem now  . OSA (obstructive sleep apnea) 03-29-11   no cpap used, not a problem now.  . Osteoporosis   . Otalgia of both ears    Dr. Simeon Craft  . Polycythemia   . Stroke [facility]) 03-29-11   CVA x2 -last 10'12-?TIA(visual problems)  . Varicose vein     Past Surgical History:  Procedure Laterality Date  . APPENDECTOMY    . BACK SURGERY    . BREAST BIOPSY    . BREAST CYST EXCISION    . BREAST LUMPECTOMY Left 2013   left stage I left breast cancer  . CHOLECYSTECTOMY  04/09/2011   Procedure: LAPAROSCOPIC CHOLECYSTECTOMY WITH INTRAOPERATIVE CHOLANGIOGRAM;  Surgeon: Odis Hollingshead, MD;  Location: [facility] ORS;  Service: General;  Laterality: N/A;  . Dental Extraction     L maxillary molar  . IR KYPHO THORACIC WITH BONE BIOPSY  09/15/2019  . KYPHOSIS SURGERY  08/2007   cement used  . OVARIAN CYST SURGERY     left  . TONSILLECTOMY    . TOTAL KNEE ARTHROPLASTY  06/2010   left  . TUBAL LIGATION  Family History  Problem Relation Age of Onset  . Breast cancer Mother   . Heart disease Father        MIs  . Colon cancer Other        cousin  . Breast cancer Sister   . Leukemia Brother        GM    Social History:  reports that she quit smoking about 31 years ago. Her smoking use included cigarettes. She started smoking about 68 years ago. She has a 37.00 pack-year smoking history. She has never used smokeless tobacco. She reports current alcohol use of about 1.0 standard drink of alcohol per week. She reports that she does not use drugs.  Allergies:  Allergies  Allergen Reactions  . Penicillins Hives, Rash and Other (See Comments)    Has patient had  a PCN reaction causing immediate rash, facial/tongue/throat swelling, SOB or lightheadedness with hypotension: Yes Has patient had a PCN reaction causing severe rash involving mucus membranes or skin necrosis: yes Has patient had a PCN reaction that required hospitalization: No Has patient had a PCN reaction occurring within the last 10 years: no If all of the above answers are "NO", then may proceed with Cephalosporin use.  Other Reaction: OTHER REACTION  . Valsartan Itching and Other (See Comments)  . Atorvastatin Diarrhea  . Carvedilol Other (See Comments)    "teeth hurt when I took it"   . Ezetimibe Nausea Only  . Fluvastatin Sodium Other (See Comments)    "Can't remember"  . Magnesium Hydroxide Other (See Comments)    REACTION: triggers HAs  . Meloxicam Other (See Comments)    Pt seeing auro's / spots.    . Pneumovax [Pneumococcal Polysaccharide Vaccine] Other (See Comments)    Local reaction  . Quinapril Hcl Other (See Comments)     "feelings of tiredness"  . Simvastatin Diarrhea  . Topamax [Topiramate] Itching  . Vit D-Vit E-Safflower Oil Other (See Comments)    Headaches    Medications:in epic  Results for orders placed or performed during the hospital encounter of 11/04/19 (from the past 48 hour(s))  CBC with Differential/Platelet     Status: Abnormal   Collection Time: 11/04/19  5:00 PM  Result Value Ref Range   WBC 12.4 (H) 4.0 - 10.5 K/uL   RBC 5.49 (H) 3.87 - 5.11 MIL/uL   Hemoglobin 16.2 (H) 12.0 - 15.0 g/dL   HCT 53.0 (H) 36 - 46 %   MCV 96.5 80.0 - 100.0 fL   MCH 29.5 26.0 - 34.0 pg   MCHC 30.6 30.0 - 36.0 g/dL   RDW 21.4 (H) 11.5 - 15.5 %   Platelets 424 (H) 150 - 400 K/uL   nRBC 0.0 0.0 - 0.2 %   Neutrophils Relative % 88 %   Neutro Abs 10.9 (H) 1.7 - 7.7 K/uL   Lymphocytes Relative 3 %   Lymphs Abs 0.4 (L) 0.7 - 4.0 K/uL   Monocytes Relative 8 %   Monocytes Absolute 0.9 0 - 1 K/uL   Eosinophils Relative 0 %   Eosinophils Absolute 0.0 0 - 0 K/uL    Basophils Relative 0 %   Basophils Absolute 0.0 0 - 0 K/uL   RBC Morphology MORPHOLOGY UNREMARKABLE    Immature Granulocytes 1 %   Abs Immature Granulocytes 0.13 (H) 0.00 - 0.07 K/uL    Comment: Performed at [facility], [place] [address], [place], [place] [address]  Comprehensive metabolic panel     Status:  Abnormal   Collection Time: 11/04/19  5:00 PM  Result Value Ref Range   Sodium 137 135 - 145 mmol/L   Potassium 4.1 3.5 - 5.1 mmol/L   Chloride 98 98 - 111 mmol/L   CO2 25 22 - 32 mmol/L   Glucose, Bld 220 (H) 70 - 99 mg/dL    Comment: Glucose reference range applies only to samples taken after fasting for at least 8 hours.   BUN 8 8 - 23 mg/dL   Creatinine, Ser 0.70 0.44 - 1.00 mg/dL   Calcium 9.4 8.9 - 10.3 mg/dL   Total Protein 7.3 6.5 - 8.1 g/dL   Albumin 4.3 3.5 - 5.0 g/dL   AST 60 (H) 15 - 41 U/L   ALT 34 0 - 44 U/L   Alkaline Phosphatase 71 38 - 126 U/L   Total Bilirubin 2.4 (H) 0.3 - 1.2 mg/dL   GFR calc non Af Amer >60 >60 mL/min   GFR calc Af Amer >60 >60 mL/min   Anion gap 14 5 - 15    Comment: Performed at [facility], [place] [address] [place], [place] [address]  APTT     Status: None   Collection Time: 11/04/19  5:00 PM  Result Value Ref Range   aPTT 34 24 - 36 seconds    Comment: Performed at [facility], [place] [address], [place], [place] [address]  Protime-INR     Status: None   Collection Time: 11/04/19  5:00 PM  Result Value Ref Range   Prothrombin Time 13.3 11.4 - 15.2 seconds   INR 1.1 0.8 - 1.2    Comment: (NOTE) INR goal varies based on device and disease states. Performed at [facility], [place] [address], [place], [place] [address]   SARS Coronavirus 2 by RT PCR (hospital order, performed in [facility] hospital lab) Nasopharyngeal Nasopharyngeal Swab     Status: None   Collection Time: 11/04/19  5:11 PM   Specimen: Nasopharyngeal Swab  Result Value Ref Range   SARS  Coronavirus 2 NEGATIVE NEGATIVE    Comment: (NOTE) SARS-CoV-2 target nucleic acids are NOT DETECTED.  The SARS-CoV-2 RNA is generally detectable in upper and lower respiratory specimens during the acute phase of infection. The lowest concentration of SARS-CoV-2 viral copies this assay can detect is 250 copies / mL. A negative result does not preclude SARS-CoV-2 infection and should not be used as the sole basis for treatment or other patient management decisions.  A negative result may occur with improper specimen collection / handling, submission of specimen other than nasopharyngeal swab, presence of viral mutation(s) within the areas targeted by this assay, and inadequate number of viral copies (<250 copies / mL). A negative result must be combined with clinical observations, patient history, and epidemiological information.  Fact Sheet for Patients:   StrictlyIdeas.no  Fact Sheet for Healthcare Providers: BankingDealers.co.za  This test is not yet approved or  cleared by the [place] FDA and has been authorized for detection and/or diagnosis of SARS-CoV-2 by FDA under an Emergency Use Authorization (EUA).  This EUA will remain in effect (meaning this test can be used) for the duration of the COVID-19 declaration under Section 564(b)(1) of the Act, 21 U.S.C. section 360bbb-3(b)(1), unless the authorization is terminated or revoked sooner.  Performed at [facility], [place] [address] [place],  [address]   MRSA PCR Screening     Status: None   Collection Time: 11/04/19 11:48  PM   Specimen: Nasal Mucosa; Nasopharyngeal  Result Value Ref Range   MRSA by PCR NEGATIVE NEGATIVE    Comment:        The GeneXpert MRSA Assay (FDA approved for NASAL specimens only), is one component of a comprehensive MRSA colonization surveillance program. It is not intended to diagnose MRSA infection nor to guide  or monitor treatment for MRSA infections. Performed at [facility], [place] [address] [place], [place] [address]   Glucose, capillary     Status: Abnormal   Collection Time: 11/05/19 12:54 AM  Result Value Ref Range   Glucose-Capillary 154 (H) 70 - 99 mg/dL    Comment: Glucose reference range applies only to samples taken after fasting for at least 8 hours.   Comment 1 Notify RN    Comment 2 Document in Chart   CBC with Differential/Platelet     Status: Abnormal   Collection Time: 11/05/19  6:59 AM  Result Value Ref Range   WBC 12.2 (H) 4.0 - 10.5 K/uL   RBC 5.14 (H) 3.87 - 5.11 MIL/uL   Hemoglobin 15.1 (H) 12.0 - 15.0 g/dL   HCT 49.0 (H) 36 - 46 %   MCV 95.3 80.0 - 100.0 fL   MCH 29.4 26.0 - 34.0 pg   MCHC 30.8 30.0 - 36.0 g/dL   RDW 21.3 (H) 11.5 - 15.5 %   Platelets 385 150 - 400 K/uL   nRBC 0.0 0.0 - 0.2 %   Neutrophils Relative % 90 %   Neutro Abs 11.0 (H) 1.7 - 7.7 K/uL   Lymphocytes Relative 3 %   Lymphs Abs 0.3 (L) 0.7 - 4.0 K/uL   Monocytes Relative 6 %   Monocytes Absolute 0.8 0 - 1 K/uL   Eosinophils Relative 0 %   Eosinophils Absolute 0.0 0 - 0 K/uL   Basophils Relative 0 %   Basophils Absolute 0.0 0 - 0 K/uL   Immature Granulocytes 1 %   Abs Immature Granulocytes 0.06 0.00 - 0.07 K/uL    Comment: Performed at [facility], [address], [place], [place] [address]  Comprehensive metabolic panel     Status: Abnormal   Collection Time: 11/05/19  6:59 AM  Result Value Ref Range   Sodium 136 135 - 145 mmol/L   Potassium 3.9 3.5 - 5.1 mmol/L   Chloride 100 98 - 111 mmol/L   CO2 24 22 - 32 mmol/L   Glucose, Bld 154 (H) 70 - 99 mg/dL    Comment: Glucose reference range applies only to samples taken after fasting for at least 8 hours.   BUN 9 8 - 23 mg/dL   Creatinine, Ser 0.68 0.44 - 1.00 mg/dL   Calcium 9.4 8.9 - 10.3 mg/dL   Total Protein 6.4 (L) 6.5 - 8.1 g/dL   Albumin 3.6 3.5 - 5.0 g/dL   AST 34 15 - 41 U/L   ALT 28 0 - 44 U/L   Alkaline  Phosphatase 57 38 - 126 U/L   Total Bilirubin 3.0 (H) 0.3 - 1.2 mg/dL   GFR calc non Af Amer >60 >60 mL/min   GFR calc Af Amer >60 >60 mL/min   Anion gap 12 5 - 15    Comment: Performed at [place] [address], [place], [place] [address]  Magnesium     Status: None   Collection Time: 11/05/19  6:59 AM  Result Value Ref Range   Magnesium 2.2 1.7 - 2.4  mg/dL    Comment: Performed at [facility], [place] [address], [place], [place] [address]    DG Chest 2 View  Result Date: 11/04/2019 CLINICAL DATA:  Fall. EXAM: CHEST - 2 VIEW COMPARISON:  June 24, 2018. FINDINGS: Stable cardiomediastinal silhouette. Hiatal hernia is noted. No pneumothorax or pleural effusion is noted. Both lungs are clear. The visualized skeletal structures are unremarkable. IMPRESSION: No active cardiopulmonary disease. Hiatal hernia. Electronically Signed   By: Marijo Conception M.D.   On: 11/04/2019 13:20   DG Pelvis 1-2 Views  Result Date: 11/04/2019 CLINICAL DATA:  Fall EXAM: PELVIS - 1-2 VIEW COMPARISON:  None. FINDINGS: Osteopenia. There is no evidence of displaced pelvic fracture or diastasis. No pelvic bone lesions are seen. Possible bladder calculus or calcified uterine fibroid in the low left hemipelvis. IMPRESSION: Osteopenia. No acute displaced fracture or dislocation of the pelvis. Please note that plain radiographs are insensitive for hip and pelvic fracture, particularly in the setting of osteopenia. Consider CT or MRI to more sensitively evaluate for fracture if there is high clinical suspicion. Electronically Signed   By: Eddie Candle M.D.   On: 11/04/2019 13:22   CT HEAD WO CONTRAST  Result Date: 11/05/2019 CLINICAL DATA:  Intracranial hemorrhage.  Subdural hemorrhage. EXAM: CT HEAD WITHOUT CONTRAST TECHNIQUE: Contiguous axial images were obtained from the base of the skull through the vertex without intravenous contrast. COMPARISON:  CT head without contrast 11/04/2019. FINDINGS: Brain:  Hemorrhagic contusions involving the inferolateral right temporal lobe and anterior inferior right frontal lobe demonstrate expected evolution and are more evident on today's exam. Hyperdense components are more prominent with some surrounding edema. Subarachnoid hemorrhage is evident at both locations. Subdural blood adjacent to the temporal lobe is stable. Anterior subdural hemorrhage is more visible on today's study extending to anterior falx. No new areas of hemorrhage are present. Moderate atrophy and white matter disease are again seen. The ventricles are of normal size. Vascular: Atherosclerotic calcifications are present within the cavernous internal carotid arteries bilaterally of the right vertebral artery. No hyperdense vessel is present. Skull: Calvarium is intact. Nondisplaced left occipital skull fracture is again seen. Fracture extends to the foramen magnum. No additional fractures are present. Skin staples are present over the left occipital scalp laceration. Sinuses/Orbits: The paranasal sinuses and mastoid air cells are clear. The globes and orbits are within normal limits. IMPRESSION: 1. Expected evolution of hemorrhagic contusions involving the inferolateral right temporal lobe and anterior inferior right frontal lobe. 2. Subarachnoid hemorrhage at both locations. 3. Stable subdural blood adjacent to the right temporal lobe. 4. Anterior subdural hemorrhage is more visible on today's study extending to the anterior falx. 5. Stable nondisplaced left occipital skull fracture. 6. Stable atrophy and white matter disease. Electronically Signed   By: San Morelle M.D.   On: 11/05/2019 05:53   CT Head Wo Contrast  Result Date: 11/04/2019 CLINICAL DATA:  Head trauma, headache. Poly trauma, critical, head/cervical spine injury suspected. Additional history provided: Mechanical fall landing on back, laceration to back of head. EXAM: CT HEAD WITHOUT CONTRAST CT CERVICAL SPINE WITHOUT CONTRAST  TECHNIQUE: Multidetector CT imaging of the head and cervical spine was performed following the standard protocol without intravenous contrast. Multiplanar CT image reconstructions of the cervical spine were also generated. COMPARISON:  Brain MRI 07/19/2018, head CT 07/01/2017, cervical spine MRI 05/04/2005. FINDINGS: CT HEAD FINDINGS Brain: There is acute extra-axial hemorrhage along the inferolateral right temporal lobe measuring up to 11 mm in thickness (for  instance as seen on series 5, image 33 and series 3, image 8). Additionally, there is subtle hemorrhagic parenchymal contusion within the anterior right temporal lobe (series 3, image 5) (series 5, image 21). Trace acute subdural hemorrhage is also questioned along the right tentorium. Hemorrhagic parenchymal contusions within the anteroinferior right frontal lobe with overlying small volume acute subarachnoid hemorrhage (greater on the right). Stable moderate chronic small vessel ischemic changes within the cerebral white matter. Redemonstrated chronic right thalamic lacunar infarct. Stable mild generalized parenchymal atrophy. No evidence of intracranial mass. There is no hydrocephalus or midline shift. Vascular: No hyperdense vessel.  Atherosclerotic calcifications. Skull: There is a nondisplaced acute fracture within the left occipital calvarium extending inferiorly to the level of the foramen magnum. Sinuses/Orbits: Visualized orbits show no acute finding. No significant paranasal sinus disease or mastoid effusion at the imaged levels. CT CERVICAL SPINE FINDINGS Alignment: Straightening of the expected cervical lordosis. Trace C4-C5 grade 1 anterolisthesis. There is leftward rotation of C1 upon C2, which may be related to patient head positioning at the time of examination. Skull base and vertebrae: The basion-dental and atlanto-dental intervals are maintained.No evidence of acute fracture to the cervical spine. Congenital nonunion of the posterior arch  of C1. Soft tissues and spinal canal: No prevertebral fluid or swelling. No visible canal hematoma. Disc levels: Mild for age cervical spondylosis. No high-grade bony spinal canal narrowing. Upper chest: No consolidation within the imaged lung apices. No visible pneumothorax. These results were called by telephone at the time of interpretation on 11/04/2019 at 3:51 pm to provider [facility] , who verbally acknowledged these results. IMPRESSION: CT head: 1. Hemorrhagic parenchymal contusions within the anteroinferior right frontal lobe with overlying small volume acute subarachnoid hemorrhage. 2. Subtle hemorrhagic parenchymal contusion is also present within the anterior right temporal lobe. 3. Acute extra-axial hemorrhage overlying the inferolateral right temporal lobe measuring up to 11 mm in thickness. Additionally, there is suspected trace acute subdural hemorrhage along the right tentorium. 4. Acute nondisplaced fracture of the left occipital calvarium extending inferiorly to the level of the foramen magnum. 5. Unchanged mild generalized parenchymal atrophy with moderate chronic small vessel ischemic disease. Redemonstrated chronic right thalamic lacunar infarct. CT cervical spine: 1. No evidence of acute fracture to the cervical spine. 2. Leftward rotation of C1 upon C2, which may be related to patient head positioning at the time of examination. Clinical correlation is recommended. 3. Mild for age cervical spondylosis. 4. Mild C4-C5 grade 1 anterolisthesis. Electronically Signed   By: Kellie Simmering DO   On: 11/04/2019 15:52   CT Cervical Spine Wo Contrast  Result Date: 11/04/2019 CLINICAL DATA:  Head trauma, headache. Poly trauma, critical, head/cervical spine injury suspected. Additional history provided: Mechanical fall landing on back, laceration to back of head. EXAM: CT HEAD WITHOUT CONTRAST CT CERVICAL SPINE WITHOUT CONTRAST TECHNIQUE: Multidetector CT imaging of the head and cervical spine was  performed following the standard protocol without intravenous contrast. Multiplanar CT image reconstructions of the cervical spine were also generated. COMPARISON:  Brain MRI 07/19/2018, head CT 07/01/2017, cervical spine MRI 05/04/2005. FINDINGS: CT HEAD FINDINGS Brain: There is acute extra-axial hemorrhage along the inferolateral right temporal lobe measuring up to 11 mm in thickness (for instance as seen on series 5, image 33 and series 3, image 8). Additionally, there is subtle hemorrhagic parenchymal contusion within the anterior right temporal lobe (series 3, image 5) (series 5, image 21). Trace acute subdural hemorrhage is also questioned along the right tentorium. Hemorrhagic  parenchymal contusions within the anteroinferior right frontal lobe with overlying small volume acute subarachnoid hemorrhage (greater on the right). Stable moderate chronic small vessel ischemic changes within the cerebral white matter. Redemonstrated chronic right thalamic lacunar infarct. Stable mild generalized parenchymal atrophy. No evidence of intracranial mass. There is no hydrocephalus or midline shift. Vascular: No hyperdense vessel.  Atherosclerotic calcifications. Skull: There is a nondisplaced acute fracture within the left occipital calvarium extending inferiorly to the level of the foramen magnum. Sinuses/Orbits: Visualized orbits show no acute finding. No significant paranasal sinus disease or mastoid effusion at the imaged levels. CT CERVICAL SPINE FINDINGS Alignment: Straightening of the expected cervical lordosis. Trace C4-C5 grade 1 anterolisthesis. There is leftward rotation of C1 upon C2, which may be related to patient head positioning at the time of examination. Skull base and vertebrae: The basion-dental and atlanto-dental intervals are maintained.No evidence of acute fracture to the cervical spine. Congenital nonunion of the posterior arch of C1. Soft tissues and spinal canal: No prevertebral fluid or swelling.  No visible canal hematoma. Disc levels: Mild for age cervical spondylosis. No high-grade bony spinal canal narrowing. Upper chest: No consolidation within the imaged lung apices. No visible pneumothorax. These results were called by telephone at the time of interpretation on 11/04/2019 at 3:51 pm to provider [facility] , who verbally acknowledged these results. IMPRESSION: CT head: 1. Hemorrhagic parenchymal contusions within the anteroinferior right frontal lobe with overlying small volume acute subarachnoid hemorrhage. 2. Subtle hemorrhagic parenchymal contusion is also present within the anterior right temporal lobe. 3. Acute extra-axial hemorrhage overlying the inferolateral right temporal lobe measuring up to 11 mm in thickness. Additionally, there is suspected trace acute subdural hemorrhage along the right tentorium. 4. Acute nondisplaced fracture of the left occipital calvarium extending inferiorly to the level of the foramen magnum. 5. Unchanged mild generalized parenchymal atrophy with moderate chronic small vessel ischemic disease. Redemonstrated chronic right thalamic lacunar infarct. CT cervical spine: 1. No evidence of acute fracture to the cervical spine. 2. Leftward rotation of C1 upon C2, which may be related to patient head positioning at the time of examination. Clinical correlation is recommended. 3. Mild for age cervical spondylosis. 4. Mild C4-C5 grade 1 anterolisthesis. Electronically Signed   By: Kellie Simmering DO   On: 11/04/2019 15:52   CT THORACIC SPINE WO CONTRAST  Result Date: 11/05/2019 CLINICAL DATA:  Status post fall. History of prior compression fractures and vertebral augmentation. EXAM: CT THORACIC SPINE WITHOUT CONTRAST TECHNIQUE: Multidetector CT images of the thoracic were obtained using the standard protocol without intravenous contrast. COMPARISON:  MRI thoracic spine 09/14/2019. FINDINGS: Alignment: Maintained. Vertebrae: The patient has suffered a compression fracture  of T4 with vertebral body height loss of approximately 60% since the prior study. Mild bony retropulsion off the mid aspect of T4 is identified but the central canal appears open. The patient has undergone vertebral augmentation for the T9 fracture seen on the prior exam. No lytic or sclerotic lesion is identified. Bones are osteopenic. Degenerative endplate signal change anteriorly at T6-7 noted. Paraspinal and other soft tissues: Dependent atelectasis is seen. Calcific aortic and coronary atherosclerosis is noted. Calcified mediastinal and hilar lymph nodes consistent with old granulomatous disease are seen. The patient has a hiatal hernia. Disc levels: The central canal and foramina appear open at all levels. IMPRESSION: Acute or subacute T4 compression fracture with vertebral body height loss of approximately 60% is new since the comparison MRI. Mild bony retropulsion off the central aspect of T4  is noted but no central canal stenosis is seen. The fracture has an appearance most consistent with a senile osteoporotic injury. Status post T9 vertebral augmentation since the prior exam without complicating feature. Marked osteopenia. Calcific aortic and coronary atherosclerosis. Hiatal hernia. Electronically Signed   By: Inge Rise M.D.   On: 11/05/2019 13:07   CT LUMBAR SPINE WO CONTRAST  Result Date: 11/04/2019 CLINICAL DATA:  Low back and left leg pain after falling. History of spinal augmentation. EXAM: CT LUMBAR SPINE WITHOUT CONTRAST TECHNIQUE: Multidetector CT imaging of the lumbar spine was performed without intravenous contrast administration. Multiplanar CT image reconstructions were also generated. COMPARISON:  CT lumbar spine 09/10/2007. Lumbar MRI 10/27/2016. Lumbar radiographs 09/04/2019. FINDINGS: Segmentation: There are 5 lumbar type vertebral bodies. The bones are severely demineralized. Alignment: Normal. Vertebrae: No definite acute osseous findings are seen. The L2 vertebral body has a  stable appearance post spinal augmentation. Cement extension into the L2-3 disc is unchanged. There are grossly stable biconcave L3 and superior endplate compression deformities at L4. Paraspinal and other soft tissues: No acute paraspinal findings. Diffuse aortic and branch vessel atherosclerosis and hepatomegaly are noted. Disc levels: No large disc herniation or acute disc space findings are identified. Chronic osseous retropulsion at the L2 compression fracture appears stable. There is stable disc bulging, endplate osteophytes and facet hypertrophy in the lower lumbar spine, contributing to stable mild foraminal narrowing. IMPRESSION: 1. No acute osseous findings in the lumbar spine. 2. Stable chronic compression deformities at L2, L3 and L4. 3. Aortic Atherosclerosis (ICD10-I70.0). Electronically Signed   By: Richardean Sale M.D.   On: 11/04/2019 15:28   CT PELVIS WO CONTRAST  Result Date: 11/04/2019 CLINICAL DATA:  Low back and left leg pain after falling. History of spinal augmentation. EXAM: CT PELVIS WITHOUT CONTRAST TECHNIQUE: Multidetector CT imaging of the pelvis was performed following the standard protocol without intravenous contrast. COMPARISON:  Pelvic radiographs today.  Pelvic CT 07/01/2017. FINDINGS: Urinary Tract: The visualized distal ureters and bladder appear unremarkable. Bowel: No bowel wall thickening, distention or surrounding inflammation identified within the pelvis. Mild sigmoid colon diverticular changes. Vascular/Lymphatic: No enlarged pelvic lymph nodes identified. Diffuse aortoiliac atherosclerosis. Reproductive: The uterus and ovaries appear stable. There is a calcified anterior uterine fibroid. No adnexal mass. Other: No pelvic ascites. Musculoskeletal: There is new asymmetric enlargement of the left piriformis muscle (image 24/3), likely indicative of an acute hematoma. There is chronic atrophy of the right piriformis muscle. No other muscular abnormalities or hematomas are  seen. The bones are diffusely demineralized. No evidence of acute fracture or dislocation. Stable sclerosis posteriorly in both iliac bones and stable chronic posttraumatic deformities of both pubic rami. There is also deformity of the lower sacrum on the sagittal images which appears new compared with prior CT, although demonstrates no definite acute features. IMPRESSION: 1. New asymmetric enlargement of the left piriformis muscle, likely indicative of an acute hematoma. 2. No evidence of acute fracture or dislocation. Age indeterminate lower sacral deformity, not likely acute. 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 3. Aortic Atherosclerosis (ICD10-I70.0). Electronically Signed   By: Richardean Sale M.D.   On: 11/04/2019 15:35   DG Chest Port 1 View  Result Date: 11/05/2019 CLINICAL DATA:  Acute respiratory failure. EXAM: PORTABLE CHEST 1 VIEW COMPARISON:  CT of the chest 11/04/2019 FINDINGS: Heart is mildly enlarged. Increasing asymmetric left-sided edema and pulmonary vascular congestion is present. Left basilar airspace opacity is noted. IMPRESSION: 1. Increasing asymmetric left-sided edema and pulmonary vascular  congestion. 2. Left basilar airspace disease. Electronically Signed   By: San Morelle M.D.   On: 11/05/2019 07:37   EEG adult  Result Date: 11/05/2019 Lora Havens, MD     11/05/2019  1:17 PM Patient Name: Chrishawna Farina MRN: [id number] Epilepsy Attending: Lora Havens Referring Physician/Provider: Dr. Marshell Garfinkel Date: 11/05/1019 Duration: 27.12 minutes Patient history: 87 year old female with early posttraumatic seizures in the setting of hemorrhagic contusions.  EEG telemetry for seizures. Level of alertness: Awake, asleep AEDs during EEG study: Keppra Technical aspects: This EEG study was done with scalp electrodes positioned according to the 10-20 International system of electrode placement. Electrical activity was acquired at a sampling rate of 500Hz   and reviewed with a high frequency filter of 70Hz  and a low frequency filter of 1Hz . EEG data were recorded continuously and digitally stored. Description: No clear posterior dominant rhythm was seen. Sleep was characterized by sleep spindles (12 to 14 Hz), maximal frontocentral region.  EEG showed continuous generalized and maximal right temporal region 3 to 6 Hz theta-delta slowing admixed with generalized 9-15 Hz alpha-beta activity.  Sharp transients were seen in the right frontal region. Hyperventilation and photic stimulation were not performed.   ABNORMALITY -Continuous slow, generalized and maximal right temporal region IMPRESSION: This study is suggestive of cortical dysfunction in right temporal region likely secondary to underlying structural abnormality as well as mild to moderate diffuse encephalopathy, nonspecific etiology.  No seizures or definite epileptiform discharges were seen throughout the recording. Lora Havens   DG Femur Min 2 Views Left  Result Date: 11/04/2019 CLINICAL DATA:  Left leg pain secondary to a fall today. EXAM: LEFT FEMUR 2 VIEWS COMPARISON:  None. FINDINGS: There is no evidence of fracture or other focal bone lesions. Left knee arthroplasty. No evidence of loosening of the components. Soft tissues are unremarkable. IMPRESSION: No acute abnormality. Electronically Signed   By: Lorriane Shire M.D.   On: 11/04/2019 13:22    Review of Systems  Unable to perform ROS: Mental status change   Blood pressure 133/65, pulse 86, temperature 98.1 F (36.7 C), temperature source Oral, resp. rate 19, height 5\' 6"  (1.676 m), weight 69.7 kg, SpO2 97 %. Physical Exam  Constitutional: No distress.  HENT:  Nose: Nose normal.  Mouth/Throat: Mucous membranes are moist.  Eyes: Pupils are equal, round, and reactive to light. No scleral icterus.  Neck:  No posterior midline cervical tenderness, no pain on active range of motion, collar removed  Cardiovascular: Normal rate and  regular rhythm.  Respiratory: Effort normal and breath sounds normal. No stridor. She has no wheezes. She has no rhonchi.  GI: She exhibits no distension and no mass. There is no abdominal tenderness.  Musculoskeletal:     Cervical back: No rigidity or tenderness.     Comments: Upper back tenderness  Neurological: She is alert. She displays no weakness. No sensory deficit.  Alert, appropriate, does remember at least a portion of her fall, follows commands, moves all extremities  Skin: Skin is warm and dry.    Assessment/Plan: 86 year old female status post fall TBI/subdural hematoma, intracerebral contusion/T4 fracture -Per Dr. Ronnald Ramp Posttraumatic seizures -Per neurology Cervical spine is cleared and I removed her collar CHF, CAD, previous CVA, polycythemia vera -management per primary service  I spoke with her grandson at length at the bedside.  Nothing further to add from Trauma standpoint.  Zenovia Jarred 11/05/2019, 3:14 PM

## 2019-11-05 NOTE — Progress Notes (Signed)
NAME:  [name], MRN:  [id number], DOB:  May 26, 1929, LOS: 1 ADMISSION DATE:  11/04/2019, CONSULTATION DATE:  11/04/2019 REFERRING MD:  EDP, CHIEF COMPLAINT:  Fall, ICH, seizure   Brief History   86 year old with multiple medical issues including Polycythemia vera, breast cancer, CAD, CVA, diverticulitis, GERD, CVA, OSA.  Admitted with mechanical fall after tripping over curb.. Found to have [place], [place] on CT imaging. She is not a candidate for surgical intervention. She had a seizure activity in ED with 1 min of full tonic-clonic activity and given ativan and loaded with keppra. PCCM consulted for admission  Past Medical History    has a past medical history of CAD (coronary artery disease), Cancer ([place]), Cardiomyopathy, Chronic diastolic CHF (congestive heart failure) ([place]) (09/13/2019), CVA (cerebral infarction), Depression with anxiety (03-29-11), Diverticulitis, DVT (deep venous thrombosis) ([place]), E. coli gastroenteritis (03-29-11), Fracture (09/10/07), GERD (gastroesophageal reflux disease), Headache(784.0), Hemorrhoids, Hiatal hernia (03-29-11), Hyperlipidemia, Hyperplastic colon polyp (06/2001), Hypertension, Iron deficiency anemia due to chronic blood loss (12/30/2017), OA (osteoarthritis) of knee (03-29-11), OSA (obstructive sleep apnea) (03-29-11), Osteoporosis, Otalgia of both ears, Polycythemia, Stroke ([place]) (03-29-11), and Varicose vein.  Significant Hospital Events   6/23 Admit  Consults:  Neurosurgery, Neurology  Procedures:  None  Significant Diagnostic Tests:  CT head 6/24 IMPRESSION: 1. Expected evolution of hemorrhagic contusions involving the inferolateral right temporal lobe and anterior inferior right frontal lobe. 2. Subarachnoid hemorrhage at both locations. 3. Stable subdural blood adjacent to the right temporal lobe. 4. Anterior subdural hemorrhage is more visible on today's study extending to the anterior falx. 5. Stable nondisplaced left occipital  skull fracture. 6. Stable atrophy and white matter disease.  Micro Data:   none  Antimicrobials:    Interim history/subjective:   Patient complains of severe pain, everything hurts, back pain Nausea earlier  Objective   Blood pressure (!) 146/65, pulse 88, temperature 100.3 F (37.9 C), temperature source Axillary, resp. rate 18, height 5\' 6"  (1.676 m), weight 69.7 kg, SpO2 97 %.        Intake/Output Summary (Last 24 hours) at 11/05/2019 0743 Last data filed at 11/05/2019 0600 Gross per 24 hour  Intake 200 ml  Output 300 ml  Net -100 ml   Filed Weights   11/04/19 1152 11/04/19 2358 11/05/19 0154  Weight: 67.6 kg 69.7 kg 69.7 kg    Examination:. Gen:      No acute distress, C collar HEENT: Pupils equal and reacting Neck:     No masses; no thyromegaly Lungs:   Clear breath sounds bilaterally CV:         S1-S2 appreciated, murmur at the base Abd:      + bowel sounds; soft, non-tender; no palpable masses, no distension Ext:    No edema; adequate peripheral perfusion Skin:      Warm and dry; no rash Neuro: Somnolent, moves extremities.  Tries to answer questions  Resolved Hospital Problem list     Assessment & Plan:  Mechanical fall ICH, [place] New onset seizures Not a surgical candidate Continue keppra EEG ordered and neuro consulted  Polycythemia vera, stage I ca of left breast s/p lumpectomy in 2013 Followed by onc with regular phlebotomy and IV iron Resume hydroxy urea when able to take PO  Acute encephalopathy due to hemorrhage, seizure Monitor mental status.  On keppra  HTN Hold lopressor  Best practice:  Diet: May have sips Pain/Anxiety/Delirium protocol (if indicated): Fentanyl PRN VAP protocol (if indicated): NA DVT prophylaxis: Avoid heparin. SCDs  GI prophylaxis: NA Glucose control: Monitor Mobility: Bed Code Status: Full Family Communication: Discussed with patient's grandson. Family requests full code Disposition: ICU  Labs    CBC: Recent Labs  Lab 10/29/19 1355 11/04/19 1700 11/05/19 0659  WBC 6.9 12.4* 12.2*  NEUTROABS 5.3 10.9* PENDING  HGB 15.7* 16.2* 15.1*  HCT 50.7* 53.0* 49.0*  MCV 93.7 96.5 95.3  PLT 477* 424* 509    Basic Metabolic Panel: Recent Labs  Lab 10/29/19 1355 11/04/19 1700  NA 135 137  K 4.3 4.1  CL 99 98  CO2 30 25  GLUCOSE 116* 220*  BUN 6* 8  CREATININE 0.73 0.70  CALCIUM 9.9 9.4   GFR: Estimated Creatinine Clearance: 45.5 mL/min (by C-G formula based on SCr of 0.7 mg/dL). Recent Labs  Lab 10/29/19 1355 11/04/19 1700 11/05/19 0659  WBC 6.9 12.4* 12.2*    Liver Function Tests: Recent Labs  Lab 10/29/19 1355 11/04/19 1700  AST 18 60*  ALT 12 34  ALKPHOS 64 71  BILITOT 1.6* 2.4*  PROT 6.5 7.3  ALBUMIN 4.2 4.3   No results for input(s): LIPASE, AMYLASE in the last 168 hours. No results for input(s): AMMONIA in the last 168 hours.  ABG    Component Value Date/Time   PHART 7.479 (H) 09/07/2007 0014   PCO2ART 34.0 (L) 09/07/2007 0014   PO2ART 59.0 (L) 09/07/2007 0014   HCO3 25.5 (H) 09/07/2007 0014   TCO2 31 07/01/2017 1229   O2SAT 93.0 09/07/2007 0014     Coagulation Profile: Recent Labs  Lab 11/04/19 1700  INR 1.1    Cardiac Enzymes: No results for input(s): CKTOTAL, CKMB, CKMBINDEX, TROPONINI in the last 168 hours.  HbA1C: No results found for: HGBA1C  CBG: Recent Labs  Lab 11/05/19 0054  GLUCAP 154*    Review of Systems:    Unable to obtain due to encephalopathy  Past Medical History  She,  has a past medical history of CAD (coronary artery disease), Cancer ([place]), Cardiomyopathy, Chronic diastolic CHF (congestive heart failure) ([place]) (09/13/2019), CVA (cerebral infarction), Depression with anxiety (03-29-11), Diverticulitis, DVT (deep venous thrombosis) ([place]), E. coli gastroenteritis (03-29-11), Fracture (09/10/07), GERD (gastroesophageal reflux disease), Headache(784.0), Hemorrhoids, Hiatal hernia (03-29-11), Hyperlipidemia,  Hyperplastic colon polyp (06/2001), Hypertension, Iron deficiency anemia due to chronic blood loss (12/30/2017), OA (osteoarthritis) of knee (03-29-11), OSA (obstructive sleep apnea) (03-29-11), Osteoporosis, Otalgia of both ears, Polycythemia, Stroke ([place]) (03-29-11), and Varicose vein.   Surgical History    Past Surgical History:  Procedure Laterality Date  . APPENDECTOMY    . BACK SURGERY    . BREAST BIOPSY    . BREAST CYST EXCISION    . BREAST LUMPECTOMY Left 2013   left stage I left breast cancer  . CHOLECYSTECTOMY  04/09/2011   Procedure: LAPAROSCOPIC CHOLECYSTECTOMY WITH INTRAOPERATIVE CHOLANGIOGRAM;  Surgeon: Odis Hollingshead, MD;  Location: [facility];  Service: General;  Laterality: N/A;  . Dental Extraction     L maxillary molar  . IR KYPHO THORACIC WITH BONE BIOPSY  09/15/2019  . KYPHOSIS SURGERY  08/2007   cement used  . OVARIAN CYST SURGERY     left  . TONSILLECTOMY    . TOTAL KNEE ARTHROPLASTY  06/2010   left  . TUBAL LIGATION       Social History   reports that she quit smoking about 31 years ago. Her smoking use included cigarettes. She started smoking about 68 years ago. She has a 37.00 pack-year smoking history. She has never used smokeless  tobacco. She reports current alcohol use of about 1.0 standard drink of alcohol per week. She reports that she does not use drugs.   Family History   Her family history includes Breast cancer in her mother and sister; Colon cancer in an other family member; Heart disease in her father; Leukemia in her brother.   Allergies Allergies  Allergen Reactions  . Penicillins Hives, Rash and Other (See Comments)    Has patient had a PCN reaction causing immediate rash, facial/tongue/throat swelling, SOB or lightheadedness with hypotension: Yes Has patient had a PCN reaction causing severe rash involving mucus membranes or skin necrosis: yes Has patient had a PCN reaction that required hospitalization: No Has patient had a PCN reaction  occurring within the last 10 years: no If all of the above answers are "NO", then may proceed with Cephalosporin use.  Other Reaction: OTHER REACTION  . Valsartan Itching and Other (See Comments)  . Atorvastatin Diarrhea  . Carvedilol Other (See Comments)    "teeth hurt when I took it"   . Ezetimibe Nausea Only  . Fluvastatin Sodium Other (See Comments)    "Can't remember"  . Magnesium Hydroxide Other (See Comments)    REACTION: triggers HAs  . Meloxicam Other (See Comments)    Pt seeing auro's / spots.    . Pneumovax [Pneumococcal Polysaccharide Vaccine] Other (See Comments)    Local reaction  . Quinapril Hcl Other (See Comments)     "feelings of tiredness"  . Simvastatin Diarrhea  . Topamax [Topiramate] Itching  . Vit D-Vit E-Safflower Oil Other (See Comments)    Headaches   The patient is critically ill with multiple organ systems failure and requires high complexity decision making for assessment and support, frequent evaluation and titration of therapies, application of advanced monitoring technologies and extensive interpretation of multiple databases. Critical Care Time devoted to patient care services described in this note independent of APP/resident time (if applicable)  is 30 minutes.   Sherrilyn Rist MD [facility] Personal pager: [phone] If unanswered, please page CCM On-call: [phone]

## 2019-11-05 NOTE — Progress Notes (Signed)
Responded to chaplain referral to get AD to patient.  Document was delivered to unit secretary who said she would give to patient. Chaplain will be page if needed further.  Jaclynn Major, [place], [facility], Pager [phone]

## 2019-11-05 NOTE — Procedures (Addendum)
Patient Name: [name]  MRN: [id number]  Epilepsy Attending: Lora Havens  Referring Physician/Provider: Dr. Marshell Garfinkel Date: 11/05/1019 Duration: 27.12 minutes  Patient history: 86 year old female with early posttraumatic seizures in the setting of hemorrhagic contusions.  EEG telemetry for seizures.  Level of alertness: Awake, asleep  AEDs during EEG study: Keppra  Technical aspects: This EEG study was done with scalp electrodes positioned according to the 10-20 International system of electrode placement. Electrical activity was acquired at a sampling rate of 500Hz  and reviewed with a high frequency filter of 70Hz  and a low frequency filter of 1Hz . EEG data were recorded continuously and digitally stored.   Description: No clear posterior dominant rhythm was seen. Sleep was characterized by sleep spindles (12 to 14 Hz), maximal frontocentral region.  EEG showed continuous generalized and maximal right temporal region 3 to 6 Hz theta-delta slowing admixed with generalized 9-15 Hz alpha-beta activity.  Sharp transients were seen in the right frontal region. Hyperventilation and photic stimulation were not performed.     ABNORMALITY -Continuous slow, generalized and maximal right temporal region  IMPRESSION: This study is suggestive of cortical dysfunction in right temporal region likely secondary to underlying structural abnormality as well as mild to moderate diffuse encephalopathy, nonspecific etiology.  No seizures or definite epileptiform discharges were seen throughout the recording.  [name]

## 2019-11-05 NOTE — Consult Note (Signed)
Neurology Consultation Reason for Consult: Seizures Referring Physician: Mannam, P  CC: Seizures  History is obtained from: Chart review  HPI: [name] is a 86 y.o. female who was walking earlier and had a mechanical fall.  She had the back of her head and had a seizure in the ER.  This is described in the nursing note from Jessica Loudermilk at 4:30 PM.  She had left head turn and behavioral arrest followed by "Pt was contracted, mildly shaking, and her eyes were rolled towards the back of her head."  She was loaded with Keppra.  CT head revealed small right subdural hematoma as well as emergent contusions.  Neurosurgery was consulted who recommended conservative management.    ROS: A 14 point ROS was performed and is negative except as noted in the HPI.  Past Medical History:  Diagnosis Date  . CAD (coronary artery disease)    mild nonobstructive disease on cath in 2003  . Cancer ([place])   . Cardiomyopathy    Probable Takotsubo, severe CP w/ normal cath in 1994. Severe CP in 2003 w/ widespread T wave inversions on ECG. Cath w/ minimal coronary disease but LV-gram showed periapical severe hypokinesis and basilar hyperkinesia (EF 40%). Last echo in 4/09 confirmed full LV functional recovery with EF 60%, no regional wall motion abnormalities, mild to moderate LVH.  Marland Kitchen Chronic diastolic CHF (congestive heart failure) ([place]) 09/13/2019  . CVA (cerebral infarction)   . Depression with anxiety 03-29-11   lost husband 3'09  . Diverticulitis   . DVT (deep venous thrombosis) ([place])    after venous ablation, R leg  . E. coli gastroenteritis 03-29-11   8'10  . Fracture 09/10/07   L2, status post vertebroplasty of L2 performed by IR  . GERD (gastroesophageal reflux disease)   . Headache(784.0)    migraines  . Hemorrhoids   . Hiatal hernia 03-29-11   no nerve problems  . Hyperlipidemia   . Hyperplastic colon polyp 06/2001  . Hypertension   . Iron deficiency anemia due to chronic  blood loss 12/30/2017  . OA (osteoarthritis) of knee 03-29-11   w/ bilateral knee pain-not a problem now  . OSA (obstructive sleep apnea) 03-29-11   no cpap used, not a problem now.  . Osteoporosis   . Otalgia of both ears    Dr. Simeon Craft  . Polycythemia   . Stroke [facility]) 03-29-11   CVA x2 -last 10'12-?TIA(visual problems)  . Varicose vein      Family History  Problem Relation Age of Onset  . Breast cancer Mother   . Heart disease Father        MIs  . Colon cancer Other        cousin  . Breast cancer Sister   . Leukemia Brother        GM     Social History:  reports that she quit smoking about 31 years ago. Her smoking use included cigarettes. She started smoking about 68 years ago. She has a 37.00 pack-year smoking history. She has never used smokeless tobacco. She reports current alcohol use of about 1.0 standard drink of alcohol per week. She reports that she does not use drugs.   Exam: Current vital signs: BP 124/63   Pulse 80   Temp 99.6 F (37.6 C) (Axillary)   Resp 16   Ht 5\' 6"  (1.676 m)   Wt 69.7 kg   SpO2 100%   BMI 24.80 kg/m  Vital signs in last 24  hours: Temp:  [97.8 F (36.6 C)-99.6 F (37.6 C)] 99.6 F (37.6 C) (06/24 0000) Pulse Rate:  [67-94] 80 (06/24 0200) Resp:  [16-27] 16 (06/24 0200) BP: (114-169)/(62-117) 124/63 (06/24 0200) SpO2:  [92 %-100 %] 100 % (06/24 0200) Weight:  [67.6 kg-69.7 kg] 69.7 kg (06/24 0154)   Physical Exam  Constitutional: Appears well-developed and well-nourished.  Psych: Affect appropriate to situation Eyes: No scleral injection HENT: No OP obstrucion MSK: no joint deformities.  Cardiovascular: Normal rate and regular rhythm.  Respiratory: Effort normal, non-labored breathing GI: Soft.  No distension. There is no tenderness.  Skin: WDI  Neuro: Mental Status: Patient is awake, alert, oriented to person,  month, year, gives place is [place] long. Responses did require repeated prompting, and she appears slightly  sleepy. Cranial Nerves: II: Visual Fields are full. Pupils are equal, round, and reactive to light.   III,IV, VI: EOMI without ptosis or diploplia.  V: Facial sensation is symmetric to temperature VII:?  Mild left facial droop VIII: hearing is intact to voice X: Uvula elevates symmetrically XI: Shoulder shrug is symmetric. XII: tongue is midline without atrophy or fasciculations.  Motor: Tone is normal. Bulk is normal. 5/5 strength was present in bilateral upper extremities, she gives poor effort in bilateral lowers, but is able to lift them against bed with relatively symmetric strength. Sensory: Sensation is symmetric to light touch and temperature in the arms and legs. Cerebellar: Does not perform   I have reviewed labs in epic and the results pertinent to this consultation are: cmp - unremarkable  I have reviewed the images obtained: CT head - SDH and contusions  Impression: 86 year old female with early posttraumatic seizures in the setting of hemorrhagic contusions.  Early posttraumatic seizures are not necessarily indicative of long-term epilepsy risk but given subdural, and I favor a slightly longer course than the typical 1 week.  I would favor getting an EEG, if this is negative, then would Keppra x1 month, if positive then may need to consider longer.  Recommendations: 1) EEG with antiepileptic recommendations as above.   Roland Rack, MD [facility] [phone]  If 7pm- 7am, please page neurology on call as listed in [place].

## 2019-11-06 ENCOUNTER — Ambulatory Visit: Payer: Self-pay

## 2019-11-06 LAB — URINALYSIS, COMPLETE (UACMP) WITH MICROSCOPIC
Bacteria, UA: NONE SEEN
Bilirubin Urine: NEGATIVE
Glucose, UA: 50 mg/dL — AB
Hgb urine dipstick: NEGATIVE
Ketones, ur: 5 mg/dL — AB
Leukocytes,Ua: NEGATIVE
Nitrite: NEGATIVE
Protein, ur: 30 mg/dL — AB
Specific Gravity, Urine: 1.017 (ref 1.005–1.030)
pH: 6 (ref 5.0–8.0)

## 2019-11-06 LAB — COMPREHENSIVE METABOLIC PANEL
ALT: 20 U/L (ref 0–44)
AST: 21 U/L (ref 15–41)
Albumin: 3.2 g/dL — ABNORMAL LOW (ref 3.5–5.0)
Alkaline Phosphatase: 52 U/L (ref 38–126)
Anion gap: 11 (ref 5–15)
BUN: 12 mg/dL (ref 8–23)
CO2: 26 mmol/L (ref 22–32)
Calcium: 9 mg/dL (ref 8.9–10.3)
Chloride: 98 mmol/L (ref 98–111)
Creatinine, Ser: 0.64 mg/dL (ref 0.44–1.00)
GFR calc Af Amer: >60 mL/min (ref >60)
GFR calc non Af Amer: >60 mL/min (ref >60)
Glucose, Bld: 153 mg/dL — ABNORMAL HIGH (ref 70–99)
Potassium: 3.8 mmol/L (ref 3.5–5.1)
Sodium: 135 mmol/L (ref 135–145)
Total Bilirubin: 2.3 mg/dL — ABNORMAL HIGH (ref 0.3–1.2)
Total Protein: 5.8 g/dL — ABNORMAL LOW (ref 6.5–8.1)

## 2019-11-06 LAB — CBC WITH DIFFERENTIAL/PLATELET
Abs Immature Granulocytes: 0.04 10*3/uL (ref 0.00–0.07)
Basophils Absolute: 0 10*3/uL (ref 0.0–0.1)
Basophils Relative: 0 %
Eosinophils Absolute: 0 10*3/uL (ref 0.0–0.5)
Eosinophils Relative: 0 %
HCT: 46 % (ref 36.0–46.0)
Hemoglobin: 14.3 g/dL (ref 12.0–15.0)
Immature Granulocytes: 0 %
Lymphocytes Relative: 3 %
Lymphs Abs: 0.3 10*3/uL — ABNORMAL LOW (ref 0.7–4.0)
MCH: 29.6 pg (ref 26.0–34.0)
MCHC: 31.1 g/dL (ref 30.0–36.0)
MCV: 95.2 fL (ref 80.0–100.0)
Monocytes Absolute: 0.8 10*3/uL (ref 0.1–1.0)
Monocytes Relative: 7 %
Neutro Abs: 10.1 10*3/uL — ABNORMAL HIGH (ref 1.7–7.7)
Neutrophils Relative %: 90 %
Platelets: 323 10*3/uL (ref 150–400)
RBC: 4.83 MIL/uL (ref 3.87–5.11)
RDW: 21.1 % — ABNORMAL HIGH (ref 11.5–15.5)
WBC: 11.3 10*3/uL — ABNORMAL HIGH (ref 4.0–10.5)
nRBC: 0 % (ref 0.0–0.2)

## 2019-11-06 LAB — MAGNESIUM: Magnesium: 2.2 mg/dL (ref 1.7–2.4)

## 2019-11-06 MED ORDER — HYDRALAZINE HCL 20 MG/ML IJ SOLN
10.0000 mg | INTRAMUSCULAR | Status: DC | PRN
  Administered 2019-11-06 – 2019-11-09 (×4): 20 mg via INTRAVENOUS
  Administered 2019-11-10: 10 mg via INTRAVENOUS
  Administered 2019-11-10 – 2019-11-11 (×3): 20 mg via INTRAVENOUS
  Filled 2019-11-06 (×7): qty 1

## 2019-11-06 MED ORDER — METOPROLOL TARTRATE 50 MG PO TABS
50.0000 mg | ORAL_TABLET | Freq: Two times a day (BID) | ORAL | Status: DC
  Administered 2019-11-06 – 2019-11-12 (×13): 50 mg via ORAL
  Filled 2019-11-06 (×13): qty 1

## 2019-11-06 NOTE — Progress Notes (Signed)
NAME:  [name], MRN:  [id number], DOB:  1929/09/20, LOS: 2 ADMISSION DATE:  11/04/2019, CONSULTATION DATE:  11/04/2019 REFERRING MD:  EDP, CHIEF COMPLAINT:  Fall, ICH, seizure   Brief History   86 year old with multiple medical issues including Polycythemia vera, breast cancer, CAD, CVA, diverticulitis, GERD, CVA, OSA.  Admitted with mechanical fall after tripping over curb.. Found to have [place], [place] on CT imaging. She is not a candidate for surgical intervention. She had a seizure activity in ED with 1 min of full tonic-clonic activity and given ativan and loaded with keppra. PCCM consulted for admission  Past Medical History    has a past medical history of CAD (coronary artery disease), Cancer ([place]), Cardiomyopathy, Chronic diastolic CHF (congestive heart failure) ([place]) (09/13/2019), CVA (cerebral infarction), Depression with anxiety (03-29-11), Diverticulitis, DVT (deep venous thrombosis) ([place]), E. coli gastroenteritis (03-29-11), Fracture (09/10/07), GERD (gastroesophageal reflux disease), Headache(784.0), Hemorrhoids, Hiatal hernia (03-29-11), Hyperlipidemia, Hyperplastic colon polyp (06/2001), Hypertension, Iron deficiency anemia due to chronic blood loss (12/30/2017), OA (osteoarthritis) of knee (03-29-11), OSA (obstructive sleep apnea) (03-29-11), Osteoporosis, Otalgia of both ears, Polycythemia, Stroke ([place]) (03-29-11), and Varicose vein.  Significant Hospital Events   6/23 Admit  Consults:  Neurosurgery, Neurology  Procedures:  None  Significant Diagnostic Tests:  CT head 6/24 IMPRESSION: 1. Expected evolution of hemorrhagic contusions involving the inferolateral right temporal lobe and anterior inferior right frontal lobe. 2. Subarachnoid hemorrhage at both locations. 3. Stable subdural blood adjacent to the right temporal lobe. 4. Anterior subdural hemorrhage is more visible on today's study extending to the anterior falx. 5. Stable nondisplaced left occipital  skull fracture. 6. Stable atrophy and white matter disease.  Micro Data:   none  Antimicrobials:  none  Interim history/subjective:   Patient complains of severe pain, everything hurts, back pain Persistent nausea Mid back pain Difficulty with blood pressure control  Objective   Blood pressure 140/66, pulse 91, temperature 98.2 F (36.8 C), temperature source Axillary, resp. rate 15, height 5\' 6"  (1.676 m), weight 69.7 kg, SpO2 99 %.        Intake/Output Summary (Last 24 hours) at 11/06/2019 0757 Last data filed at 11/06/2019 0600 Gross per 24 hour  Intake 195.31 ml  Output 400 ml  Net -204.69 ml   Filed Weights   11/04/19 2358 11/05/19 0154 11/06/19 0122  Weight: 69.7 kg 69.7 kg 69.7 kg    Examination:. Gen:      No acute distress HEENT: Pupils equal and reacting Neck:     No masses; no thyromegaly Lungs:   Clear breath sounds bilaterally CV:         S1-S2 appreciated, murmur at the base Abd:      + bowel sounds; soft, non-tender; no palpable masses, no distension Ext:    No edema; adequate peripheral perfusion Skin:      Warm and dry; no rash Neuro: Awake and alert, moving all extremities  Resolved Hospital Problem list     Assessment & Plan:  Mechanical fall ICH, [place] New onset seizures Not a surgical candidate Continue keppra  Polycythemia vera, stage I ca of left breast s/p lumpectomy in 2013 Followed by onc with regular phlebotomy and IV iron Resume hydroxy urea when able to take PO  Acute encephalopathy due to hemorrhage, seizure Monitor mental status.  On keppra  HTN -Lopressor resumed at 20 -Try to keep systolic BP below 182-993  New T4 compression fracture -Likely contributing to pain and discomfort -May need further intervention  Resumed  Ativan 0.25 twice daily for anxiety  Will transfer to progressive floor today  Best practice:  Diet: May have sips Pain/Anxiety/Delirium protocol (if indicated): Fentanyl PRN VAP protocol (if  indicated): NA DVT prophylaxis: Avoid heparin. SCDs GI prophylaxis: NA Glucose control: Monitor Mobility: Bed Code Status: Full Family Communication: Discussed with patient's grandson. Family requests full code Disposition: ICU  Labs   CBC: Recent Labs  Lab 11/04/19 1700 11/05/19 0659 11/06/19 0145  WBC 12.4* 12.2* 11.3*  NEUTROABS 10.9* 11.0* 10.1*  HGB 16.2* 15.1* 14.3  HCT 53.0* 49.0* 46.0  MCV 96.5 95.3 95.2  PLT 424* 385 166    Basic Metabolic Panel: Recent Labs  Lab 11/04/19 1700 11/05/19 0659 11/06/19 0145  NA 137 136 135  K 4.1 3.9 3.8  CL 98 100 98  CO2 25 24 26   GLUCOSE 220* 154* 153*  BUN 8 9 12   CREATININE 0.70 0.68 0.64  CALCIUM 9.4 9.4 9.0  MG  --  2.2 2.2   GFR: Estimated Creatinine Clearance: 45.5 mL/min (by C-G formula based on SCr of 0.64 mg/dL). Recent Labs  Lab 11/04/19 1700 11/05/19 0659 11/06/19 0145  WBC 12.4* 12.2* 11.3*    Liver Function Tests: Recent Labs  Lab 11/04/19 1700 11/05/19 0659 11/06/19 0145  AST 60* 34 21  ALT 34 28 20  ALKPHOS 71 57 52  BILITOT 2.4* 3.0* 2.3*  PROT 7.3 6.4* 5.8*  ALBUMIN 4.3 3.6 3.2*   No results for input(s): LIPASE, AMYLASE in the last 168 hours. No results for input(s): AMMONIA in the last 168 hours.  ABG    Component Value Date/Time   PHART 7.479 (H) 09/07/2007 0014   PCO2ART 34.0 (L) 09/07/2007 0014   PO2ART 59.0 (L) 09/07/2007 0014   HCO3 25.5 (H) 09/07/2007 0014   TCO2 31 07/01/2017 1229   O2SAT 93.0 09/07/2007 0014     Coagulation Profile: Recent Labs  Lab 11/04/19 1700  INR 1.1    Cardiac Enzymes: No results for input(s): CKTOTAL, CKMB, CKMBINDEX, TROPONINI in the last 168 hours.  HbA1C: No results found for: HGBA1C  CBG: Recent Labs  Lab 11/05/19 0054 11/05/19 1918  GLUCAP 154* 101*    Review of Systems:    Unable to obtain due to encephalopathy  Past Medical History  She,  has a past medical history of CAD (coronary artery disease), Cancer ([place]),  Cardiomyopathy, Chronic diastolic CHF (congestive heart failure) ([place]) (09/13/2019), CVA (cerebral infarction), Depression with anxiety (03-29-11), Diverticulitis, DVT (deep venous thrombosis) ([place]), E. coli gastroenteritis (03-29-11), Fracture (09/10/07), GERD (gastroesophageal reflux disease), Headache(784.0), Hemorrhoids, Hiatal hernia (03-29-11), Hyperlipidemia, Hyperplastic colon polyp (06/2001), Hypertension, Iron deficiency anemia due to chronic blood loss (12/30/2017), OA (osteoarthritis) of knee (03-29-11), OSA (obstructive sleep apnea) (03-29-11), Osteoporosis, Otalgia of both ears, Polycythemia, Stroke ([place]) (03-29-11), and Varicose vein.   Surgical History    Past Surgical History:  Procedure Laterality Date  . APPENDECTOMY    . BACK SURGERY    . BREAST BIOPSY    . BREAST CYST EXCISION    . BREAST LUMPECTOMY Left 2013   left stage I left breast cancer  . CHOLECYSTECTOMY  04/09/2011   Procedure: LAPAROSCOPIC CHOLECYSTECTOMY WITH INTRAOPERATIVE CHOLANGIOGRAM;  Surgeon: Odis Hollingshead, MD;  Location: [facility] ORS;  Service: General;  Laterality: N/A;  . Dental Extraction     L maxillary molar  . IR KYPHO THORACIC WITH BONE BIOPSY  09/15/2019  . KYPHOSIS SURGERY  08/2007   cement used  . OVARIAN CYST SURGERY  left  . TONSILLECTOMY    . TOTAL KNEE ARTHROPLASTY  06/2010   left  . TUBAL LIGATION       Social History   reports that she quit smoking about 31 years ago. Her smoking use included cigarettes. She started smoking about 68 years ago. She has a 37.00 pack-year smoking history. She has never used smokeless tobacco. She reports current alcohol use of about 1.0 standard drink of alcohol per week. She reports that she does not use drugs.   Family History   Her family history includes Breast cancer in her mother and sister; Colon cancer in an other family member; Heart disease in her father; Leukemia in her brother.   Allergies Allergies  Allergen Reactions  . Penicillins Hives,  Rash and Other (See Comments)    Has patient had a PCN reaction causing immediate rash, facial/tongue/throat swelling, SOB or lightheadedness with hypotension: Yes Has patient had a PCN reaction causing severe rash involving mucus membranes or skin necrosis: yes Has patient had a PCN reaction that required hospitalization: No Has patient had a PCN reaction occurring within the last 10 years: no If all of the above answers are "NO", then may proceed with Cephalosporin use.  Other Reaction: OTHER REACTION  . Valsartan Itching and Other (See Comments)  . Atorvastatin Diarrhea  . Carvedilol Other (See Comments)    "teeth hurt when I took it"   . Ezetimibe Nausea Only  . Fluvastatin Sodium Other (See Comments)    "Can't remember"  . Magnesium Hydroxide Other (See Comments)    REACTION: triggers HAs  . Meloxicam Other (See Comments)    Pt seeing auro's / spots.    . Pneumovax [Pneumococcal Polysaccharide Vaccine] Other (See Comments)    Local reaction  . Quinapril Hcl Other (See Comments)     "feelings of tiredness"  . Simvastatin Diarrhea  . Topamax [Topiramate] Itching  . Vit D-Vit E-Safflower Oil Other (See Comments)    Headaches   The patient is critically ill with multiple organ systems failure and requires high complexity decision making for assessment and support, frequent evaluation and titration of therapies, application of advanced monitoring technologies and extensive interpretation of multiple databases. Critical Care Time devoted to patient care services described in this note independent of APP/resident time (if applicable)  is 32 minutes.   Sherrilyn Rist MD [facility] Personal pager: [phone] If unanswered, please page CCM On-call: [phone]

## 2019-11-06 NOTE — Progress Notes (Signed)
Attempt to call report x2  

## 2019-11-06 NOTE — Progress Notes (Signed)
Consult interventional radiology for possible kyphoplasty of T4

## 2019-11-06 NOTE — Progress Notes (Signed)
[place] Progress Note Patient Name: [name] DOB: 1930-01-08 MRN: [id number]   Date of Service  11/06/2019  HPI/Events of Note  Patient is still hypertensive despite PRN Labetalol, Latest BP is 165/75.  eICU Interventions  PRN Hydralazine order added for SBP > 140 mmHg despite Labetalol.        [name] 11/06/2019, 2:57 AM

## 2019-11-06 NOTE — Consult Note (Signed)
Chief Complaint: Patient was seen in consultation today for T4 compression fracture/vertebral augmentation.  Referring Physician(s): Sherrilyn Rist A  Supervising Physician: Luanne Bras  Patient Status: [facility] - In-pt  History of Present Illness: [name] is a 86 y.o. female with a past medical history of hypertension, hyperlipidemia, DVT, HF, cardiomyopathy, CVA/TIA, GERD, diverticulitis, polycythemia vera, anemia, OA, OSA, depression, and anxiety. She presented to [facility] ED 11/04/2019 after a mechanical fall from tripping over a curb. In ED, she was found to have a ICH/SAH and was admitted for further management. Neurosurgery was consulted who states patient is not a surgical candidate and recommend conservative management at this time. During work-up, patient also complaining of mild back pain. CT thoracic spine was obtained which revealed a T4 compression fracture, new when compared to most recent MRI 09/2019.  CT thoracic spine  1. Acute or subacute T4 compression fracture with vertebral body height loss of approximately 60% is new since the comparison MRI. Mild bony retropulsion off the central aspect of T4 is noted but no central canal stenosis is seen. The fracture has an appearance most consistent with a senile osteoporotic injury. 2. Status post T9 vertebral augmentation since the prior exam without complicating feature. 3. Marked osteopenia. 4. Calcific aortic and coronary atherosclerosis. 5. Hiatal hernia.  NIR requested by Dr. Ander Slade for possible image-guided T4 kyphoplasty/vertebroplasty. Patient awake and alert laying in bed. Accompanied by daughter and grandson at bedside. States she currently does not have back pain, however moving exacerbates her back pain, rating it 8/10 with movements. Denies fever, chills, chest pain, dyspnea, abdominal pain, or headache.   Past Medical History:  Diagnosis Date  . CAD (coronary artery disease)    mild  nonobstructive disease on cath in 2003  . Cancer ([place])   . Cardiomyopathy    Probable Takotsubo, severe CP w/ normal cath in 1994. Severe CP in 2003 w/ widespread T wave inversions on ECG. Cath w/ minimal coronary disease but LV-gram showed periapical severe hypokinesis and basilar hyperkinesia (EF 40%). Last echo in 4/09 confirmed full LV functional recovery with EF 60%, no regional wall motion abnormalities, mild to moderate LVH.  Marland Kitchen Chronic diastolic CHF (congestive heart failure) ([place]) 09/13/2019  . CVA (cerebral infarction)   . Depression with anxiety 03-29-11   lost husband 3'09  . Diverticulitis   . DVT (deep venous thrombosis) ([place])    after venous ablation, R leg  . E. coli gastroenteritis 03-29-11   8'10  . Fracture 09/10/07   L2, status post vertebroplasty of L2 performed by IR  . GERD (gastroesophageal reflux disease)   . Headache(784.0)    migraines  . Hemorrhoids   . Hiatal hernia 03-29-11   no nerve problems  . Hyperlipidemia   . Hyperplastic colon polyp 06/2001  . Hypertension   . Iron deficiency anemia due to chronic blood loss 12/30/2017  . OA (osteoarthritis) of knee 03-29-11   w/ bilateral knee pain-not a problem now  . OSA (obstructive sleep apnea) 03-29-11   no cpap used, not a problem now.  . Osteoporosis   . Otalgia of both ears    Dr. Simeon Craft  . Polycythemia   . Stroke [facility]) 03-29-11   CVA x2 -last 10'12-?TIA(visual problems)  . Varicose vein     Past Surgical History:  Procedure Laterality Date  . APPENDECTOMY    . BACK SURGERY    . BREAST BIOPSY    . BREAST CYST EXCISION    . BREAST LUMPECTOMY Left  2013   left stage I left breast cancer  . CHOLECYSTECTOMY  04/09/2011   Procedure: LAPAROSCOPIC CHOLECYSTECTOMY WITH INTRAOPERATIVE CHOLANGIOGRAM;  Surgeon: Odis Hollingshead, MD;  Location: [facility] ORS;  Service: General;  Laterality: N/A;  . Dental Extraction     L maxillary molar  . IR KYPHO THORACIC WITH BONE BIOPSY  09/15/2019  . KYPHOSIS SURGERY  08/2007     cement used  . OVARIAN CYST SURGERY     left  . TONSILLECTOMY    . TOTAL KNEE ARTHROPLASTY  06/2010   left  . TUBAL LIGATION      Allergies: Penicillins, Valsartan, Atorvastatin, Carvedilol, Ezetimibe, Fluvastatin sodium, Magnesium hydroxide, Meloxicam, Pneumovax [pneumococcal polysaccharide vaccine], Quinapril hcl, Simvastatin, Topamax [topiramate], and Vit d-vit e-safflower oil  Medications: Prior to Admission medications   Medication Sig Start Date End Date Taking? Authorizing Provider  aspirin 81 MG tablet Take 81 mg by mouth daily.   Yes [provider]  bisacodyl (DULCOLAX) 10 MG suppository Place 1 suppository (10 mg total) rectally daily as needed for moderate constipation. 09/17/19  Yes [name], Laxman, MD  bismuth subsalicylate (PEPTO BISMOL) 262 MG/15ML suspension Take 30 mLs by mouth daily as needed for indigestion or diarrhea or loose stools.    Yes [provider]  docusate sodium (COLACE) 100 MG capsule Take 100 mg by mouth 2 (two) times daily.    Yes [provider]  HYDROcodone-acetaminophen (NORCO/VICODIN) 5-325 MG tablet Take 1 tablet by mouth 2 (two) times daily as needed. Patient taking differently: Take 1 tablet by mouth 2 (two) times daily as needed for moderate pain.  10/08/19  Yes Paz, Alda Berthold, MD  hydroxyurea (HYDREA) 500 MG capsule Take 1 capsule (500 mg total) by mouth every other day. May take with food to minimize GI side effects. 10/05/19  Yes Lassen, Arlo C, PA-C  ibuprofen (ADVIL) 200 MG tablet Take 200 mg by mouth every 6 (six) hours as needed for moderate pain.    Yes [provider]  Lidocaine 4 % PTCH LIDOCAINE 4% PATCH :Apply 1 patch topically daily and remove after 12 hours 10/05/19  Yes Lassen, Arlo C, PA-C  LORazepam (ATIVAN) 0.5 MG tablet TAKE 1/2 TABLET BY MOUTH EVERY MORNING AND EVERY AFTERNOON. THEN TAKE 2 TABLETS BY MOUTH AT BEDTIME AS NEEDED Patient taking differently: Take 0.5 mg by mouth See admin instructions.  Take 1/2 tablet by mouth every morning and every afternoon. Then take 2 tablets by mouth at bedtime as needed. 10/08/19  Yes Paz, Alda Berthold, MD  metoprolol tartrate (LOPRESSOR) 50 MG tablet Take 1 tablet (50 mg total) by mouth 2 (two) times daily. 10/09/19  Yes Paz, Alda Berthold, MD  Nutritional Supplements (BOOST BREEZE PO) Take 1 each by mouth daily.    Yes [provider]  omeprazole (PRILOSEC) 20 MG capsule Take 1 capsule (20 mg total) by mouth daily. 10/05/19  Yes Lassen, Arlo C, PA-C  ondansetron (ZOFRAN) 4 MG tablet Take 1 tablet (4 mg total) by mouth every 6 (six) hours as needed for nausea or vomiting. 10/05/19  Yes Oscar La, Arlo C, PA-C  polyethylene glycol (MIRALAX / GLYCOLAX) 17 g packet Take 17 g by mouth daily.   Yes [provider]  tizanidine (ZANAFLEX) 2 MG capsule Take 1 capsule (2 mg total) by mouth 3 (three) times daily as needed for muscle spasms. 10/05/19  Yes Lassen, Arlo C, PA-C  valACYclovir (VALTREX) 500 MG tablet Take 2 tablets at onset of rash and 2 tablets 12  hours later as needed. 02/16/19  Yes [place], Holli Humbles, NP     Family History  Problem Relation Age of Onset  . Breast cancer Mother   . Heart disease Father        MIs  . Colon cancer Other        cousin  . Breast cancer Sister   . Leukemia Brother        GM    Social History   Socioeconomic History  . Marital status: Widowed    Spouse name: Not on file  . Number of children: 3  . Years of education: Not on file  . Highest education level: Not on file  Occupational History  . Occupation: n/a  Tobacco Use  . Smoking status: Former Smoker    Packs/day: 1.00    Years: 37.00    Pack years: 37.00    Types: Cigarettes    Start date: 06/26/1949    Quit date: 05/14/1988    Years since quitting: 31.5  . Smokeless tobacco: Never Used  . Tobacco comment: quit 25 years ago  Vaping Use  . Vaping Use: Never used  Substance and Sexual Activity  . Alcohol use: Yes    Alcohol/week: 1.0 standard drink      Types: 1 Glasses of wine per week    Comment: occasional/social  . Drug use: No  . Sexual activity: Not Currently  Other Topics Concern  . Not on file  Social History Narrative   Lost husband 07/29/07-    Lives in [place] in a town house by herself, has 3 daughters, lost Mickel Baas [phone])   [place] lives in [place], [place] ( [place])     Still drives             Social Determinants of Health   Financial Resource Strain:   . Difficulty of Paying Living Expenses:   Food Insecurity:   . Worried About Charity fundraiser in the Last Year:   . Arboriculturist in the Last Year:   Transportation Needs:   . Film/video editor (Medical):   Marland Kitchen Lack of Transportation (Non-Medical):   Physical Activity:   . Days of Exercise per Week:   . Minutes of Exercise per Session:   Stress:   . Feeling of Stress :   Social Connections:   . Frequency of Communication with Friends and Family:   . Frequency of Social Gatherings with Friends and Family:   . Attends Religious Services:   . Active Member of Clubs or Organizations:   . Attends Archivist Meetings:   Marland Kitchen Marital Status:      Review of Systems: A 12 point ROS discussed and pertinent positives are indicated in the HPI above.  All other systems are negative.  Review of Systems  Constitutional: Negative for chills and fever.  Respiratory: Negative for shortness of breath and wheezing.   Cardiovascular: Negative for chest pain and palpitations.  Gastrointestinal: Negative for abdominal pain.  Musculoskeletal: Positive for back pain.  Neurological: Negative for headaches.  Psychiatric/Behavioral: Negative for behavioral problems and confusion.    Vital Signs: BP (!) 143/79 (BP Location: Right Arm)   Pulse 91   Temp 98.2 F (36.8 C) (Oral)   Resp (!) 21   Ht 5\' 6"  (1.676 m)   Wt 153 lb 10.6 oz (69.7 kg)   SpO2 98%   BMI 24.80 kg/m   Physical Exam Vitals and nursing note reviewed.  Constitutional:  General: She is not in acute distress.    Appearance: Normal appearance.  Cardiovascular:     Rate and Rhythm: Normal rate and regular rhythm.     Heart sounds: Normal heart sounds. No murmur heard.   Pulmonary:     Effort: Pulmonary effort is normal. No respiratory distress.     Breath sounds: Normal breath sounds. No wheezing.  Skin:    General: Skin is warm and dry.  Neurological:     Mental Status: She is alert and oriented to person, place, and time.      MD Evaluation Airway: WNL Heart: WNL Abdomen: WNL Chest/ Lungs: WNL ASA  Classification: 3 Mallampati/Airway Score: Two   Imaging: DG Chest 2 View  Result Date: 11/04/2019 CLINICAL DATA:  Fall. EXAM: CHEST - 2 VIEW COMPARISON:  June 24, 2018. FINDINGS: Stable cardiomediastinal silhouette. Hiatal hernia is noted. No pneumothorax or pleural effusion is noted. Both lungs are clear. The visualized skeletal structures are unremarkable. IMPRESSION: No active cardiopulmonary disease. Hiatal hernia. Electronically Signed   By: Marijo Conception M.D.   On: 11/04/2019 13:20   DG Pelvis 1-2 Views  Result Date: 11/04/2019 CLINICAL DATA:  Fall EXAM: PELVIS - 1-2 VIEW COMPARISON:  None. FINDINGS: Osteopenia. There is no evidence of displaced pelvic fracture or diastasis. No pelvic bone lesions are seen. Possible bladder calculus or calcified uterine fibroid in the low left hemipelvis. IMPRESSION: Osteopenia. No acute displaced fracture or dislocation of the pelvis. Please note that plain radiographs are insensitive for hip and pelvic fracture, particularly in the setting of osteopenia. Consider CT or MRI to more sensitively evaluate for fracture if there is high clinical suspicion. Electronically Signed   By: Eddie Candle M.D.   On: 11/04/2019 13:22   CT HEAD WO CONTRAST  Result Date: 11/05/2019 CLINICAL DATA:  Intracranial hemorrhage.  Subdural hemorrhage. EXAM: CT HEAD WITHOUT CONTRAST TECHNIQUE: Contiguous axial images were obtained  from the base of the skull through the vertex without intravenous contrast. COMPARISON:  CT head without contrast 11/04/2019. FINDINGS: Brain: Hemorrhagic contusions involving the inferolateral right temporal lobe and anterior inferior right frontal lobe demonstrate expected evolution and are more evident on today's exam. Hyperdense components are more prominent with some surrounding edema. Subarachnoid hemorrhage is evident at both locations. Subdural blood adjacent to the temporal lobe is stable. Anterior subdural hemorrhage is more visible on today's study extending to anterior falx. No new areas of hemorrhage are present. Moderate atrophy and white matter disease are again seen. The ventricles are of normal size. Vascular: Atherosclerotic calcifications are present within the cavernous internal carotid arteries bilaterally of the right vertebral artery. No hyperdense vessel is present. Skull: Calvarium is intact. Nondisplaced left occipital skull fracture is again seen. Fracture extends to the foramen magnum. No additional fractures are present. Skin staples are present over the left occipital scalp laceration. Sinuses/Orbits: The paranasal sinuses and mastoid air cells are clear. The globes and orbits are within normal limits. IMPRESSION: 1. Expected evolution of hemorrhagic contusions involving the inferolateral right temporal lobe and anterior inferior right frontal lobe. 2. Subarachnoid hemorrhage at both locations. 3. Stable subdural blood adjacent to the right temporal lobe. 4. Anterior subdural hemorrhage is more visible on today's study extending to the anterior falx. 5. Stable nondisplaced left occipital skull fracture. 6. Stable atrophy and white matter disease. Electronically Signed   By: San Morelle M.D.   On: 11/05/2019 05:53   CT Head Wo Contrast  Result Date: 11/04/2019 CLINICAL DATA:  Head  trauma, headache. Poly trauma, critical, head/cervical spine injury suspected. Additional  history provided: Mechanical fall landing on back, laceration to back of head. EXAM: CT HEAD WITHOUT CONTRAST CT CERVICAL SPINE WITHOUT CONTRAST TECHNIQUE: Multidetector CT imaging of the head and cervical spine was performed following the standard protocol without intravenous contrast. Multiplanar CT image reconstructions of the cervical spine were also generated. COMPARISON:  Brain MRI 07/19/2018, head CT 07/01/2017, cervical spine MRI 05/04/2005. FINDINGS: CT HEAD FINDINGS Brain: There is acute extra-axial hemorrhage along the inferolateral right temporal lobe measuring up to 11 mm in thickness (for instance as seen on series 5, image 33 and series 3, image 8). Additionally, there is subtle hemorrhagic parenchymal contusion within the anterior right temporal lobe (series 3, image 5) (series 5, image 21). Trace acute subdural hemorrhage is also questioned along the right tentorium. Hemorrhagic parenchymal contusions within the anteroinferior right frontal lobe with overlying small volume acute subarachnoid hemorrhage (greater on the right). Stable moderate chronic small vessel ischemic changes within the cerebral white matter. Redemonstrated chronic right thalamic lacunar infarct. Stable mild generalized parenchymal atrophy. No evidence of intracranial mass. There is no hydrocephalus or midline shift. Vascular: No hyperdense vessel.  Atherosclerotic calcifications. Skull: There is a nondisplaced acute fracture within the left occipital calvarium extending inferiorly to the level of the foramen magnum. Sinuses/Orbits: Visualized orbits show no acute finding. No significant paranasal sinus disease or mastoid effusion at the imaged levels. CT CERVICAL SPINE FINDINGS Alignment: Straightening of the expected cervical lordosis. Trace C4-C5 grade 1 anterolisthesis. There is leftward rotation of C1 upon C2, which may be related to patient head positioning at the time of examination. Skull base and vertebrae: The  basion-dental and atlanto-dental intervals are maintained.No evidence of acute fracture to the cervical spine. Congenital nonunion of the posterior arch of C1. Soft tissues and spinal canal: No prevertebral fluid or swelling. No visible canal hematoma. Disc levels: Mild for age cervical spondylosis. No high-grade bony spinal canal narrowing. Upper chest: No consolidation within the imaged lung apices. No visible pneumothorax. These results were called by telephone at the time of interpretation on 11/04/2019 at 3:51 pm to provider [facility] , who verbally acknowledged these results. IMPRESSION: CT head: 1. Hemorrhagic parenchymal contusions within the anteroinferior right frontal lobe with overlying small volume acute subarachnoid hemorrhage. 2. Subtle hemorrhagic parenchymal contusion is also present within the anterior right temporal lobe. 3. Acute extra-axial hemorrhage overlying the inferolateral right temporal lobe measuring up to 11 mm in thickness. Additionally, there is suspected trace acute subdural hemorrhage along the right tentorium. 4. Acute nondisplaced fracture of the left occipital calvarium extending inferiorly to the level of the foramen magnum. 5. Unchanged mild generalized parenchymal atrophy with moderate chronic small vessel ischemic disease. Redemonstrated chronic right thalamic lacunar infarct. CT cervical spine: 1. No evidence of acute fracture to the cervical spine. 2. Leftward rotation of C1 upon C2, which may be related to patient head positioning at the time of examination. Clinical correlation is recommended. 3. Mild for age cervical spondylosis. 4. Mild C4-C5 grade 1 anterolisthesis. Electronically Signed   By: Kellie Simmering DO   On: 11/04/2019 15:52   CT Cervical Spine Wo Contrast  Result Date: 11/04/2019 CLINICAL DATA:  Head trauma, headache. Poly trauma, critical, head/cervical spine injury suspected. Additional history provided: Mechanical fall landing on back, laceration to  back of head. EXAM: CT HEAD WITHOUT CONTRAST CT CERVICAL SPINE WITHOUT CONTRAST TECHNIQUE: Multidetector CT imaging of the head and cervical spine was performed following  the standard protocol without intravenous contrast. Multiplanar CT image reconstructions of the cervical spine were also generated. COMPARISON:  Brain MRI 07/19/2018, head CT 07/01/2017, cervical spine MRI 05/04/2005. FINDINGS: CT HEAD FINDINGS Brain: There is acute extra-axial hemorrhage along the inferolateral right temporal lobe measuring up to 11 mm in thickness (for instance as seen on series 5, image 33 and series 3, image 8). Additionally, there is subtle hemorrhagic parenchymal contusion within the anterior right temporal lobe (series 3, image 5) (series 5, image 21). Trace acute subdural hemorrhage is also questioned along the right tentorium. Hemorrhagic parenchymal contusions within the anteroinferior right frontal lobe with overlying small volume acute subarachnoid hemorrhage (greater on the right). Stable moderate chronic small vessel ischemic changes within the cerebral white matter. Redemonstrated chronic right thalamic lacunar infarct. Stable mild generalized parenchymal atrophy. No evidence of intracranial mass. There is no hydrocephalus or midline shift. Vascular: No hyperdense vessel.  Atherosclerotic calcifications. Skull: There is a nondisplaced acute fracture within the left occipital calvarium extending inferiorly to the level of the foramen magnum. Sinuses/Orbits: Visualized orbits show no acute finding. No significant paranasal sinus disease or mastoid effusion at the imaged levels. CT CERVICAL SPINE FINDINGS Alignment: Straightening of the expected cervical lordosis. Trace C4-C5 grade 1 anterolisthesis. There is leftward rotation of C1 upon C2, which may be related to patient head positioning at the time of examination. Skull base and vertebrae: The basion-dental and atlanto-dental intervals are maintained.No evidence of  acute fracture to the cervical spine. Congenital nonunion of the posterior arch of C1. Soft tissues and spinal canal: No prevertebral fluid or swelling. No visible canal hematoma. Disc levels: Mild for age cervical spondylosis. No high-grade bony spinal canal narrowing. Upper chest: No consolidation within the imaged lung apices. No visible pneumothorax. These results were called by telephone at the time of interpretation on 11/04/2019 at 3:51 pm to provider [facility] , who verbally acknowledged these results. IMPRESSION: CT head: 1. Hemorrhagic parenchymal contusions within the anteroinferior right frontal lobe with overlying small volume acute subarachnoid hemorrhage. 2. Subtle hemorrhagic parenchymal contusion is also present within the anterior right temporal lobe. 3. Acute extra-axial hemorrhage overlying the inferolateral right temporal lobe measuring up to 11 mm in thickness. Additionally, there is suspected trace acute subdural hemorrhage along the right tentorium. 4. Acute nondisplaced fracture of the left occipital calvarium extending inferiorly to the level of the foramen magnum. 5. Unchanged mild generalized parenchymal atrophy with moderate chronic small vessel ischemic disease. Redemonstrated chronic right thalamic lacunar infarct. CT cervical spine: 1. No evidence of acute fracture to the cervical spine. 2. Leftward rotation of C1 upon C2, which may be related to patient head positioning at the time of examination. Clinical correlation is recommended. 3. Mild for age cervical spondylosis. 4. Mild C4-C5 grade 1 anterolisthesis. Electronically Signed   By: Kellie Simmering DO   On: 11/04/2019 15:52   CT THORACIC SPINE WO CONTRAST  Result Date: 11/05/2019 CLINICAL DATA:  Status post fall. History of prior compression fractures and vertebral augmentation. EXAM: CT THORACIC SPINE WITHOUT CONTRAST TECHNIQUE: Multidetector CT images of the thoracic were obtained using the standard protocol without  intravenous contrast. COMPARISON:  MRI thoracic spine 09/14/2019. FINDINGS: Alignment: Maintained. Vertebrae: The patient has suffered a compression fracture of T4 with vertebral body height loss of approximately 60% since the prior study. Mild bony retropulsion off the mid aspect of T4 is identified but the central canal appears open. The patient has undergone vertebral augmentation for the T9 fracture seen  on the prior exam. No lytic or sclerotic lesion is identified. Bones are osteopenic. Degenerative endplate signal change anteriorly at T6-7 noted. Paraspinal and other soft tissues: Dependent atelectasis is seen. Calcific aortic and coronary atherosclerosis is noted. Calcified mediastinal and hilar lymph nodes consistent with old granulomatous disease are seen. The patient has a hiatal hernia. Disc levels: The central canal and foramina appear open at all levels. IMPRESSION: Acute or subacute T4 compression fracture with vertebral body height loss of approximately 60% is new since the comparison MRI. Mild bony retropulsion off the central aspect of T4 is noted but no central canal stenosis is seen. The fracture has an appearance most consistent with a senile osteoporotic injury. Status post T9 vertebral augmentation since the prior exam without complicating feature. Marked osteopenia. Calcific aortic and coronary atherosclerosis. Hiatal hernia. Electronically Signed   By: Inge Rise M.D.   On: 11/05/2019 13:07   CT LUMBAR SPINE WO CONTRAST  Result Date: 11/04/2019 CLINICAL DATA:  Low back and left leg pain after falling. History of spinal augmentation. EXAM: CT LUMBAR SPINE WITHOUT CONTRAST TECHNIQUE: Multidetector CT imaging of the lumbar spine was performed without intravenous contrast administration. Multiplanar CT image reconstructions were also generated. COMPARISON:  CT lumbar spine 09/10/2007. Lumbar MRI 10/27/2016. Lumbar radiographs 09/04/2019. FINDINGS: Segmentation: There are 5 lumbar type  vertebral bodies. The bones are severely demineralized. Alignment: Normal. Vertebrae: No definite acute osseous findings are seen. The L2 vertebral body has a stable appearance post spinal augmentation. Cement extension into the L2-3 disc is unchanged. There are grossly stable biconcave L3 and superior endplate compression deformities at L4. Paraspinal and other soft tissues: No acute paraspinal findings. Diffuse aortic and branch vessel atherosclerosis and hepatomegaly are noted. Disc levels: No large disc herniation or acute disc space findings are identified. Chronic osseous retropulsion at the L2 compression fracture appears stable. There is stable disc bulging, endplate osteophytes and facet hypertrophy in the lower lumbar spine, contributing to stable mild foraminal narrowing. IMPRESSION: 1. No acute osseous findings in the lumbar spine. 2. Stable chronic compression deformities at L2, L3 and L4. 3. Aortic Atherosclerosis (ICD10-I70.0). Electronically Signed   By: Richardean Sale M.D.   On: 11/04/2019 15:28   CT PELVIS WO CONTRAST  Result Date: 11/04/2019 CLINICAL DATA:  Low back and left leg pain after falling. History of spinal augmentation. EXAM: CT PELVIS WITHOUT CONTRAST TECHNIQUE: Multidetector CT imaging of the pelvis was performed following the standard protocol without intravenous contrast. COMPARISON:  Pelvic radiographs today.  Pelvic CT 07/01/2017. FINDINGS: Urinary Tract: The visualized distal ureters and bladder appear unremarkable. Bowel: No bowel wall thickening, distention or surrounding inflammation identified within the pelvis. Mild sigmoid colon diverticular changes. Vascular/Lymphatic: No enlarged pelvic lymph nodes identified. Diffuse aortoiliac atherosclerosis. Reproductive: The uterus and ovaries appear stable. There is a calcified anterior uterine fibroid. No adnexal mass. Other: No pelvic ascites. Musculoskeletal: There is new asymmetric enlargement of the left piriformis muscle  (image 24/3), likely indicative of an acute hematoma. There is chronic atrophy of the right piriformis muscle. No other muscular abnormalities or hematomas are seen. The bones are diffusely demineralized. No evidence of acute fracture or dislocation. Stable sclerosis posteriorly in both iliac bones and stable chronic posttraumatic deformities of both pubic rami. There is also deformity of the lower sacrum on the sagittal images which appears new compared with prior CT, although demonstrates no definite acute features. IMPRESSION: 1. New asymmetric enlargement of the left piriformis muscle, likely indicative of an acute hematoma. 2.  No evidence of acute fracture or dislocation. Age indeterminate lower sacral deformity, not likely acute. 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 3. Aortic Atherosclerosis (ICD10-I70.0). Electronically Signed   By: Richardean Sale M.D.   On: 11/04/2019 15:35   DG Chest Port 1 View  Result Date: 11/05/2019 CLINICAL DATA:  Acute respiratory failure. EXAM: PORTABLE CHEST 1 VIEW COMPARISON:  CT of the chest 11/04/2019 FINDINGS: Heart is mildly enlarged. Increasing asymmetric left-sided edema and pulmonary vascular congestion is present. Left basilar airspace opacity is noted. IMPRESSION: 1. Increasing asymmetric left-sided edema and pulmonary vascular congestion. 2. Left basilar airspace disease. Electronically Signed   By: San Morelle M.D.   On: 11/05/2019 07:37   EEG adult  Result Date: 11/05/2019 Lora Havens, MD     11/05/2019  1:17 PM Patient Name: [name] MRN: [id number] Epilepsy Attending: Lora Havens Referring Physician/Provider: Dr. 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  EEG telemetry for seizures. Level of alertness: Awake, asleep AEDs during EEG study: Keppra Technical aspects: This EEG study was done  with scalp electrodes positioned according to the 10-20 International system of electrode placement. Electrical activity was acquired at a sampling rate of 500Hz  and reviewed with a high frequency filter of 70Hz  and a low frequency filter of 1Hz . EEG data were recorded continuously and digitally stored. Description: No clear posterior dominant rhythm was seen. Sleep was characterized by sleep spindles (12 to 14 Hz), maximal frontocentral region.  EEG showed continuous generalized and maximal right temporal region 3 to 6 Hz theta-delta slowing admixed with generalized 9-15 Hz alpha-beta activity.  Sharp transients were seen in the right frontal region. Hyperventilation and photic stimulation were not performed.   ABNORMALITY -Continuous slow, generalized and maximal right temporal region IMPRESSION: This study is suggestive of cortical dysfunction in right temporal region likely secondary to underlying structural abnormality as well as mild to moderate diffuse encephalopathy, nonspecific etiology.  No seizures or definite epileptiform discharges were seen throughout the recording. Lora Havens   DG Femur Min 2 Views Left  Result Date: 11/04/2019 CLINICAL DATA:  Left leg pain secondary to a fall today. EXAM: LEFT FEMUR 2 VIEWS COMPARISON:  None. FINDINGS: There is no evidence of fracture or other focal bone lesions. Left knee arthroplasty. No evidence of loosening of the components. Soft tissues are unremarkable. IMPRESSION: No acute abnormality. Electronically Signed   By: Lorriane Shire M.D.   On: 11/04/2019 13:22    Labs:  CBC: Recent Labs    10/29/19 1355 11/04/19 1700 11/05/19 0659 11/06/19 0145  WBC 6.9 12.4* 12.2* 11.3*  HGB 15.7* 16.2* 15.1* 14.3  HCT 50.7* 53.0* 49.0* 46.0  PLT 477* 424* 385 323    COAGS: Recent Labs    09/14/19 0424 11/04/19 1700  INR 1.0 1.1  APTT  --  34    BMP: Recent Labs    10/29/19 1355 11/04/19 1700 11/05/19 0659 11/06/19 0145  NA 135 137 136  135  K 4.3 4.1 3.9 3.8  CL 99 98 100 98  CO2 30 25 24 26   GLUCOSE 116* 220* 154* 153*  BUN 6* 8 9 12   CALCIUM 9.9 9.4 9.4 9.0  CREATININE 0.73 0.70 0.68 0.64  GFRNONAA >60 >60 >60 >60  GFRAA >60 >60 >60 >60    LIVER FUNCTION TESTS: Recent Labs    10/29/19 1355 11/04/19 1700 11/05/19 0659 11/06/19 0145  BILITOT  1.6* 2.4* 3.0* 2.3*  AST 18 60* 34 21  ALT 12 34 28 20  ALKPHOS 64 71 57 52  PROT 6.5 7.3 6.4* 5.8*  ALBUMIN 4.2 4.3 3.6 3.2*     Assessment and Plan:  T4 compression fracture, seen on CT thoracic spine 11/05/2019 (new since recent MR thoracic spine 09/14/2019). Plan for image-guided T4 kyphoplasty/vertebroplasty tentatively for Monday 11/09/2019 with Dr. Estanislado Pandy pending IR scheduling and UA approval per Dr. Estanislado Pandy. Case/images have been reviewed by Dr. Estanislado Pandy who approves procedure. Patient's insurance has approved procedure. Patient will be NPO at midnight prior to procedure. Afebrile. She does not take blood thinners. INR 1.1 11/04/2019. UA pending- please do clean catch, if not possible do in&out straight cath for UA.  Risks and benefits of T4 kyphoplasty/vertebroplasty were discussed with the patient including, but not limited to education regarding the natural healing process of compression fractures without intervention, bleeding, infection, cement migration which may cause spinal cord damage, paralysis, pulmonary embolism or even death. This interventional procedure involves the use of X-rays and because of the nature of the planned procedure, it is possible that we will have prolonged use of X-ray fluoroscopy. Potential radiation risks to you include (but are not limited to) the following: - A slightly elevated risk for cancer  several years later in life. This risk is typically less than 0.5% percent. This risk is low in comparison to the normal incidence of human cancer, which is 33% for women and 50% for men according to the [employer]. -  Radiation induced injury can include skin redness, resembling a rash, tissue breakdown / ulcers and hair loss (which can be temporary or permanent).  The likelihood of either of these occurring depends on the difficulty of the procedure and whether you are sensitive to radiation due to previous procedures, disease, or genetic conditions.  IF your procedure requires a prolonged use of radiation, you will be notified and given written instructions for further action.  It is your responsibility to monitor the irradiated area for the 2 weeks following the procedure and to notify your physician if you are concerned that you have suffered a radiation induced injury.   All of the patient's questions were answered, patient is agreeable to proceed. Consent signed and in IR control room.   Thank you for this interesting consult.  I greatly enjoyed meeting [name] and look forward to participating in their care.  A copy of this report was sent to the requesting provider on this date.  Electronically Signed: Earley Abide, PA-C 11/06/2019, 3:39 PM   I spent a total of 40 Minutes in face to face in clinical consultation, greater than 50% of which was counseling/coordinating care for T4 compression fracture/vertebral augmentation.

## 2019-11-06 NOTE — Progress Notes (Signed)
[facility] Progress Note Patient Name: Beatric Fulop DOB: 10/23/1929 MRN: [id number]   Date of Service  11/06/2019  HPI/Events of Note  Patient needs morning labs ordered  eICU Interventions  CBC, CMET, Mg++ ordered for the a.m.        Kerry Kass Jakeim Sedore 11/06/2019, 1:33 AM

## 2019-11-07 DIAGNOSIS — S22040D Wedge compression fracture of fourth thoracic vertebra, subsequent encounter for fracture with routine healing: Secondary | ICD-10-CM

## 2019-11-07 DIAGNOSIS — D45 Polycythemia vera: Secondary | ICD-10-CM

## 2019-11-07 MED ORDER — ONDANSETRON HCL 4 MG/2ML IJ SOLN
4.0000 mg | Freq: Four times a day (QID) | INTRAMUSCULAR | Status: DC | PRN
  Filled 2019-11-07: qty 2

## 2019-11-07 NOTE — Progress Notes (Signed)
PROGRESS NOTE    [name]  KPT:[id number] DOB: 11-24-1929 DOA: 11/04/2019 PCP: Colon Branch, MD   Brief Narrative:  Brief History   86 year old with multiple medical issues including Polycythemia vera, breast cancer, CAD, CVA, diverticulitis, GERD, CVA, OSA.  Admitted with mechanical fall after tripping over curb.. Found to have [place], Y[place] on CT imaging. She is not a candidate for surgical intervention. She had a seizure activity in ED with 1 min of full tonic-clonic activity and given ativan and loaded with keppra. PCCM consulted for admission    Assessment & Plan:   Active Problems:   ICH (intracerebral hemorrhage) (HCC)    Polycythemia vera, stage I ca of left breast s/p lumpectomy in 2013 Followed by onc with regular phlebotomy and IV iron Resume hydroxy urea when able to take PO  Acute encephalopathy due to hemorrhage, seizure Monitor mental status.  On keppra No change in neuro Check cbc in AM  HTN -Lopressor resumed at 50 -Try to keep systolic BP below 170-017  New T4 compression fracture -Likely contributing to pain and discomfort -Planned T4 kypho monday  Anxiety Resumed Ativan 0.25 twice daily for anxiety  Ttransferred to progressive floor 6/25  DVT prophylaxis: SCD/Compression stockings  Code Status: Full    Code Status Orders  (From admission, onward)         Start     Ordered   11/04/19 1759  Full code  Continuous        11/04/19 1759        Code Status History    Date Active Date Inactive Code Status Order ID Comments User Context   09/13/2019 0816 09/17/2019 2300 Full Code [id number]  Vernelle Emerald, MD Inpatient   04/09/2011 1410 04/10/2011 1552 Full Code [id number]  Boykin Nearing, RN Inpatient   Advance Care Planning Activity     Family Communication: discussed with patient  Disposition Plan:    Status is: Inpatient  Remains inpatient appropriate because:Ongoing active pain requiring inpatient pain management and  Inpatient level of care appropriate due to severity of illness   Dispo: The patient is from: Home              Anticipated d/c is to: TBD              Anticipated d/c date is: 3 days              Patient currently is not medically stable to d/c.       Consults called: IR Admission status: Inpatient   Consultants:   AS ABOVE  Procedures:  DG Chest 2 View  Result Date: 11/04/2019 CLINICAL DATA:  Fall. EXAM: CHEST - 2 VIEW COMPARISON:  June 24, 2018. FINDINGS: Stable cardiomediastinal silhouette. Hiatal hernia is noted. No pneumothorax or pleural effusion is noted. Both lungs are clear. The visualized skeletal structures are unremarkable. IMPRESSION: No active cardiopulmonary disease. Hiatal hernia. Electronically Signed   By: Marijo Conception M.D.   On: 11/04/2019 13:20   DG Pelvis 1-2 Views  Result Date: 11/04/2019 CLINICAL DATA:  Fall EXAM: PELVIS - 1-2 VIEW COMPARISON:  None. FINDINGS: Osteopenia. There is no evidence of displaced pelvic fracture or diastasis. No pelvic bone lesions are seen. Possible bladder calculus or calcified uterine fibroid in the low left hemipelvis. IMPRESSION: Osteopenia. No acute displaced fracture or dislocation of the pelvis. Please note that plain radiographs are insensitive for hip and pelvic fracture, particularly in the setting of osteopenia. Consider CT or MRI  to more sensitively evaluate for fracture if there is high clinical suspicion. Electronically Signed   By: Eddie Candle M.D.   On: 11/04/2019 13:22   CT HEAD WO CONTRAST  Result Date: 11/05/2019 CLINICAL DATA:  Intracranial hemorrhage.  Subdural hemorrhage. EXAM: CT HEAD WITHOUT CONTRAST TECHNIQUE: Contiguous axial images were obtained from the base of the skull through the vertex without intravenous contrast. COMPARISON:  CT head without contrast 11/04/2019. FINDINGS: Brain: Hemorrhagic contusions involving the inferolateral right temporal lobe and anterior inferior right frontal lobe  demonstrate expected evolution and are more evident on today's exam. Hyperdense components are more prominent with some surrounding edema. Subarachnoid hemorrhage is evident at both locations. Subdural blood adjacent to the temporal lobe is stable. Anterior subdural hemorrhage is more visible on today's study extending to anterior falx. No new areas of hemorrhage are present. Moderate atrophy and white matter disease are again seen. The ventricles are of normal size. Vascular: Atherosclerotic calcifications are present within the cavernous internal carotid arteries bilaterally of the right vertebral artery. No hyperdense vessel is present. Skull: Calvarium is intact. Nondisplaced left occipital skull fracture is again seen. Fracture extends to the foramen magnum. No additional fractures are present. Skin staples are present over the left occipital scalp laceration. Sinuses/Orbits: The paranasal sinuses and mastoid air cells are clear. The globes and orbits are within normal limits. IMPRESSION: 1. Expected evolution of hemorrhagic contusions involving the inferolateral right temporal lobe and anterior inferior right frontal lobe. 2. Subarachnoid hemorrhage at both locations. 3. Stable subdural blood adjacent to the right temporal lobe. 4. Anterior subdural hemorrhage is more visible on today's study extending to the anterior falx. 5. Stable nondisplaced left occipital skull fracture. 6. Stable atrophy and white matter disease. Electronically Signed   By: San Morelle M.D.   On: 11/05/2019 05:53   CT Head Wo Contrast  Result Date: 11/04/2019 CLINICAL DATA:  Head trauma, headache. Poly trauma, critical, head/cervical spine injury suspected. Additional history provided: Mechanical fall landing on back, laceration to back of head. EXAM: CT HEAD WITHOUT CONTRAST CT CERVICAL SPINE WITHOUT CONTRAST TECHNIQUE: Multidetector CT imaging of the head and cervical spine was performed following the standard protocol  without intravenous contrast. Multiplanar CT image reconstructions of the cervical spine were also generated. COMPARISON:  Brain MRI 07/19/2018, head CT 07/01/2017, cervical spine MRI 05/04/2005. FINDINGS: CT HEAD FINDINGS Brain: There is acute extra-axial hemorrhage along the inferolateral right temporal lobe measuring up to 11 mm in thickness (for instance as seen on series 5, image 33 and series 3, image 8). Additionally, there is subtle hemorrhagic parenchymal contusion within the anterior right temporal lobe (series 3, image 5) (series 5, image 21). Trace acute subdural hemorrhage is also questioned along the right tentorium. Hemorrhagic parenchymal contusions within the anteroinferior right frontal lobe with overlying small volume acute subarachnoid hemorrhage (greater on the right). Stable moderate chronic small vessel ischemic changes within the cerebral white matter. Redemonstrated chronic right thalamic lacunar infarct. Stable mild generalized parenchymal atrophy. No evidence of intracranial mass. There is no hydrocephalus or midline shift. Vascular: No hyperdense vessel.  Atherosclerotic calcifications. Skull: There is a nondisplaced acute fracture within the left occipital calvarium extending inferiorly to the level of the foramen magnum. Sinuses/Orbits: Visualized orbits show no acute finding. No significant paranasal sinus disease or mastoid effusion at the imaged levels. CT CERVICAL SPINE FINDINGS Alignment: Straightening of the expected cervical lordosis. Trace C4-C5 grade 1 anterolisthesis. There is leftward rotation of C1 upon C2, which may be related  to patient head positioning at the time of examination. Skull base and vertebrae: The basion-dental and atlanto-dental intervals are maintained.No evidence of acute fracture to the cervical spine. Congenital nonunion of the posterior arch of C1. Soft tissues and spinal canal: No prevertebral fluid or swelling. No visible canal hematoma. Disc levels:  Mild for age cervical spondylosis. No high-grade bony spinal canal narrowing. Upper chest: No consolidation within the imaged lung apices. No visible pneumothorax. These results were called by telephone at the time of interpretation on 11/04/2019 at 3:51 pm to provider [facility] , who verbally acknowledged these results. IMPRESSION: CT head: 1. Hemorrhagic parenchymal contusions within the anteroinferior right frontal lobe with overlying small volume acute subarachnoid hemorrhage. 2. Subtle hemorrhagic parenchymal contusion is also present within the anterior right temporal lobe. 3. Acute extra-axial hemorrhage overlying the inferolateral right temporal lobe measuring up to 11 mm in thickness. Additionally, there is suspected trace acute subdural hemorrhage along the right tentorium. 4. Acute nondisplaced fracture of the left occipital calvarium extending inferiorly to the level of the foramen magnum. 5. Unchanged mild generalized parenchymal atrophy with moderate chronic small vessel ischemic disease. Redemonstrated chronic right thalamic lacunar infarct. CT cervical spine: 1. No evidence of acute fracture to the cervical spine. 2. Leftward rotation of C1 upon C2, which may be related to patient head positioning at the time of examination. Clinical correlation is recommended. 3. Mild for age cervical spondylosis. 4. Mild C4-C5 grade 1 anterolisthesis. Electronically Signed   By: Kellie Simmering DO   On: 11/04/2019 15:52   CT Cervical Spine Wo Contrast  Result Date: 11/04/2019 CLINICAL DATA:  Head trauma, headache. Poly trauma, critical, head/cervical spine injury suspected. Additional history provided: Mechanical fall landing on back, laceration to back of head. EXAM: CT HEAD WITHOUT CONTRAST CT CERVICAL SPINE WITHOUT CONTRAST TECHNIQUE: Multidetector CT imaging of the head and cervical spine was performed following the standard protocol without intravenous contrast. Multiplanar CT image reconstructions of the  cervical spine were also generated. COMPARISON:  Brain MRI 07/19/2018, head CT 07/01/2017, cervical spine MRI 05/04/2005. FINDINGS: CT HEAD FINDINGS Brain: There is acute extra-axial hemorrhage along the inferolateral right temporal lobe measuring up to 11 mm in thickness (for instance as seen on series 5, image 33 and series 3, image 8). Additionally, there is subtle hemorrhagic parenchymal contusion within the anterior right temporal lobe (series 3, image 5) (series 5, image 21). Trace acute subdural hemorrhage is also questioned along the right tentorium. Hemorrhagic parenchymal contusions within the anteroinferior right frontal lobe with overlying small volume acute subarachnoid hemorrhage (greater on the right). Stable moderate chronic small vessel ischemic changes within the cerebral white matter. Redemonstrated chronic right thalamic lacunar infarct. Stable mild generalized parenchymal atrophy. No evidence of intracranial mass. There is no hydrocephalus or midline shift. Vascular: No hyperdense vessel.  Atherosclerotic calcifications. Skull: There is a nondisplaced acute fracture within the left occipital calvarium extending inferiorly to the level of the foramen magnum. Sinuses/Orbits: Visualized orbits show no acute finding. No significant paranasal sinus disease or mastoid effusion at the imaged levels. CT CERVICAL SPINE FINDINGS Alignment: Straightening of the expected cervical lordosis. Trace C4-C5 grade 1 anterolisthesis. There is leftward rotation of C1 upon C2, which may be related to patient head positioning at the time of examination. Skull base and vertebrae: The basion-dental and atlanto-dental intervals are maintained.No evidence of acute fracture to the cervical spine. Congenital nonunion of the posterior arch of C1. Soft tissues and spinal canal: No prevertebral fluid or swelling.  No visible canal hematoma. Disc levels: Mild for age cervical spondylosis. No high-grade bony spinal canal  narrowing. Upper chest: No consolidation within the imaged lung apices. No visible pneumothorax. These results were called by telephone at the time of interpretation on 11/04/2019 at 3:51 pm to provider [facility] , who verbally acknowledged these results. IMPRESSION: CT head: 1. Hemorrhagic parenchymal contusions within the anteroinferior right frontal lobe with overlying small volume acute subarachnoid hemorrhage. 2. Subtle hemorrhagic parenchymal contusion is also present within the anterior right temporal lobe. 3. Acute extra-axial hemorrhage overlying the inferolateral right temporal lobe measuring up to 11 mm in thickness. Additionally, there is suspected trace acute subdural hemorrhage along the right tentorium. 4. Acute nondisplaced fracture of the left occipital calvarium extending inferiorly to the level of the foramen magnum. 5. Unchanged mild generalized parenchymal atrophy with moderate chronic small vessel ischemic disease. Redemonstrated chronic right thalamic lacunar infarct. CT cervical spine: 1. No evidence of acute fracture to the cervical spine. 2. Leftward rotation of C1 upon C2, which may be related to patient head positioning at the time of examination. Clinical correlation is recommended. 3. Mild for age cervical spondylosis. 4. Mild C4-C5 grade 1 anterolisthesis. Electronically Signed   By: Kellie Simmering DO   On: 11/04/2019 15:52   CT THORACIC SPINE WO CONTRAST  Result Date: 11/05/2019 CLINICAL DATA:  Status post fall. History of prior compression fractures and vertebral augmentation. EXAM: CT THORACIC SPINE WITHOUT CONTRAST TECHNIQUE: Multidetector CT images of the thoracic were obtained using the standard protocol without intravenous contrast. COMPARISON:  MRI thoracic spine 09/14/2019. FINDINGS: Alignment: Maintained. Vertebrae: The patient has suffered a compression fracture of T4 with vertebral body height loss of approximately 60% since the prior study. Mild bony retropulsion off  the mid aspect of T4 is identified but the central canal appears open. The patient has undergone vertebral augmentation for the T9 fracture seen on the prior exam. No lytic or sclerotic lesion is identified. Bones are osteopenic. Degenerative endplate signal change anteriorly at T6-7 noted. Paraspinal and other soft tissues: Dependent atelectasis is seen. Calcific aortic and coronary atherosclerosis is noted. Calcified mediastinal and hilar lymph nodes consistent with old granulomatous disease are seen. The patient has a hiatal hernia. Disc levels: The central canal and foramina appear open at all levels. IMPRESSION: Acute or subacute T4 compression fracture with vertebral body height loss of approximately 60% is new since the comparison MRI. Mild bony retropulsion off the central aspect of T4 is noted but no central canal stenosis is seen. The fracture has an appearance most consistent with a senile osteoporotic injury. Status post T9 vertebral augmentation since the prior exam without complicating feature. Marked osteopenia. Calcific aortic and coronary atherosclerosis. Hiatal hernia. Electronically Signed   By: Inge Rise M.D.   On: 11/05/2019 13:07   CT LUMBAR SPINE WO CONTRAST  Result Date: 11/04/2019 CLINICAL DATA:  Low back and left leg pain after falling. History of spinal augmentation. EXAM: CT LUMBAR SPINE WITHOUT CONTRAST TECHNIQUE: Multidetector CT imaging of the lumbar spine was performed without intravenous contrast administration. Multiplanar CT image reconstructions were also generated. COMPARISON:  CT lumbar spine 09/10/2007. Lumbar MRI 10/27/2016. Lumbar radiographs 09/04/2019. FINDINGS: Segmentation: There are 5 lumbar type vertebral bodies. The bones are severely demineralized. Alignment: Normal. Vertebrae: No definite acute osseous findings are seen. The L2 vertebral body has a stable appearance post spinal augmentation. Cement extension into the L2-3 disc is unchanged. There are  grossly stable biconcave L3 and superior endplate compression  deformities at L4. Paraspinal and other soft tissues: No acute paraspinal findings. Diffuse aortic and branch vessel atherosclerosis and hepatomegaly are noted. Disc levels: No large disc herniation or acute disc space findings are identified. Chronic osseous retropulsion at the L2 compression fracture appears stable. There is stable disc bulging, endplate osteophytes and facet hypertrophy in the lower lumbar spine, contributing to stable mild foraminal narrowing. IMPRESSION: 1. No acute osseous findings in the lumbar spine. 2. Stable chronic compression deformities at L2, L3 and L4. 3. Aortic Atherosclerosis (ICD10-I70.0). Electronically Signed   By: Richardean Sale M.D.   On: 11/04/2019 15:28   CT PELVIS WO CONTRAST  Result Date: 11/04/2019 CLINICAL DATA:  Low back and left leg pain after falling. History of spinal augmentation. EXAM: CT PELVIS WITHOUT CONTRAST TECHNIQUE: Multidetector CT imaging of the pelvis was performed following the standard protocol without intravenous contrast. COMPARISON:  Pelvic radiographs today.  Pelvic CT 07/01/2017. FINDINGS: Urinary Tract: The visualized distal ureters and bladder appear unremarkable. Bowel: No bowel wall thickening, distention or surrounding inflammation identified within the pelvis. Mild sigmoid colon diverticular changes. Vascular/Lymphatic: No enlarged pelvic lymph nodes identified. Diffuse aortoiliac atherosclerosis. Reproductive: The uterus and ovaries appear stable. There is a calcified anterior uterine fibroid. No adnexal mass. Other: No pelvic ascites. Musculoskeletal: There is new asymmetric enlargement of the left piriformis muscle (image 24/3), likely indicative of an acute hematoma. There is chronic atrophy of the right piriformis muscle. No other muscular abnormalities or hematomas are seen. The bones are diffusely demineralized. No evidence of acute fracture or dislocation. Stable  sclerosis posteriorly in both iliac bones and stable chronic posttraumatic deformities of both pubic rami. There is also deformity of the lower sacrum on the sagittal images which appears new compared with prior CT, although demonstrates no definite acute features. IMPRESSION: 1. New asymmetric enlargement of the left piriformis muscle, likely indicative of an acute hematoma. 2. No evidence of acute fracture or dislocation. Age indeterminate lower sacral deformity, not likely acute. 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 3. Aortic Atherosclerosis (ICD10-I70.0). Electronically Signed   By: Richardean Sale M.D.   On: 11/04/2019 15:35   DG Chest Port 1 View  Result Date: 11/05/2019 CLINICAL DATA:  Acute respiratory failure. EXAM: PORTABLE CHEST 1 VIEW COMPARISON:  CT of the chest 11/04/2019 FINDINGS: Heart is mildly enlarged. Increasing asymmetric left-sided edema and pulmonary vascular congestion is present. Left basilar airspace opacity is noted. IMPRESSION: 1. Increasing asymmetric left-sided edema and pulmonary vascular congestion. 2. Left basilar airspace disease. Electronically Signed   By: San Morelle M.D.   On: 11/05/2019 07:37   EEG adult  Result Date: 11/05/2019 Lora Havens, MD     11/05/2019  1:17 PM Patient Name: [name] MRN: [id number] Epilepsy Attending: Lora Havens Referring Physician/Provider: Dr. 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  EEG telemetry for seizures. Level of alertness: Awake, asleep AEDs during EEG study: Keppra Technical aspects: This EEG study was done with scalp electrodes positioned according to the 10-20 International system of electrode placement. Electrical activity was acquired at a sampling rate of 500Hz  and reviewed with a high frequency filter of 70Hz  and a low frequency filter of 1Hz . EEG data  were recorded continuously and digitally stored. Description: No clear posterior dominant rhythm was seen. Sleep was characterized by sleep spindles (12 to 14 Hz), maximal frontocentral region.  EEG showed continuous generalized and maximal right  temporal region 3 to 6 Hz theta-delta slowing admixed with generalized 9-15 Hz alpha-beta activity.  Sharp transients were seen in the right frontal region. Hyperventilation and photic stimulation were not performed.   ABNORMALITY -Continuous slow, generalized and maximal right temporal region IMPRESSION: This study is suggestive of cortical dysfunction in right temporal region likely secondary to underlying structural abnormality as well as mild to moderate diffuse encephalopathy, nonspecific etiology.  No seizures or definite epileptiform discharges were seen throughout the recording. Lora Havens   DG Femur Min 2 Views Left  Result Date: 11/04/2019 CLINICAL DATA:  Left leg pain secondary to a fall today. EXAM: LEFT FEMUR 2 VIEWS COMPARISON:  None. FINDINGS: There is no evidence of fracture or other focal bone lesions. Left knee arthroplasty. No evidence of loosening of the components. Soft tissues are unremarkable. IMPRESSION: No acute abnormality. Electronically Signed   By: Lorriane Shire M.D.   On: 11/04/2019 13:22     Antimicrobials:   NONE    Subjective: Pt reports persistent back pain with movement  Objective: Vitals:   11/07/19 0426 11/07/19 0500 11/07/19 0825 11/07/19 1234  BP: (!) 151/70  134/72 (!) 143/68  Pulse: 72  74 71  Resp: 18  16 16   Temp: 98.2 F (36.8 C)  98.2 F (36.8 C) 97.6 F (36.4 C)  TempSrc: Oral  Oral Oral  SpO2: 100%  93% 97%  Weight:  67.5 kg    Height:        Intake/Output Summary (Last 24 hours) at 11/07/2019 1410 Last data filed at 11/06/2019 1800 Gross per 24 hour  Intake 110 ml  Output 550 ml  Net -440 ml   Filed Weights   11/05/19 0154 11/06/19 0122 11/07/19 0500  Weight: 69.7 kg 69.7 kg 67.5  kg    Examination:  General exam: Calm, mild uncomfortable with movement Respiratory system: Clear to auscultation. Respiratory effort normal. Cardiovascular system: S1 & S2 heard, RRR. No JVD, murmurs, rubs, gallops or clicks. No pedal edema. Gastrointestinal system: Abdomen is nondistended, soft and nontender. No organomegaly or masses felt. Normal bowel sounds heard. Central nervous system: Alert,  No focal neurological deficits-limited exam 2/2  Extremities: wwp, no edema. Skin: No new rashes, lesions or ulcers Psychiatry: poor insight, likely cog deficits/alz     Data Reviewed: I have personally reviewed following labs and imaging studies  CBC: Recent Labs  Lab 11/04/19 1700 11/05/19 0659 11/06/19 0145  WBC 12.4* 12.2* 11.3*  NEUTROABS 10.9* 11.0* 10.1*  HGB 16.2* 15.1* 14.3  HCT 53.0* 49.0* 46.0  MCV 96.5 95.3 95.2  PLT 424* 385 810   Basic Metabolic Panel: Recent Labs  Lab 11/04/19 1700 11/05/19 0659 11/06/19 0145  NA 137 136 135  K 4.1 3.9 3.8  CL 98 100 98  CO2 25 24 26   GLUCOSE 220* 154* 153*  BUN 8 9 12   CREATININE 0.70 0.68 0.64  CALCIUM 9.4 9.4 9.0  MG  --  2.2 2.2   GFR: Estimated Creatinine Clearance: 45.5 mL/min (by C-G formula based on SCr of 0.64 mg/dL). Liver Function Tests: Recent Labs  Lab 11/04/19 1700 11/05/19 0659 11/06/19 0145  AST 60* 34 21  ALT 34 28 20  ALKPHOS 71 57 52  BILITOT 2.4* 3.0* 2.3*  PROT 7.3 6.4* 5.8*  ALBUMIN 4.3 3.6 3.2*   No results for input(s): LIPASE, AMYLASE in the last 168 hours. No results for input(s): AMMONIA in the last 168 hours. Coagulation Profile: Recent Labs  Lab 11/04/19 1700  INR 1.1   Cardiac Enzymes: No results for input(s): CKTOTAL, CKMB, CKMBINDEX, TROPONINI in the last 168 hours. BNP (last 3 results) No results for input(s): PROBNP in the last 8760 hours. HbA1C: No results for input(s): HGBA1C in the last 72 hours. CBG: Recent Labs  Lab 11/05/19 0054 11/05/19 1918  GLUCAP  154* 101*   Lipid Profile: No results for input(s): CHOL, HDL, LDLCALC, TRIG, CHOLHDL, LDLDIRECT in the last 72 hours. Thyroid Function Tests: No results for input(s): TSH, T4TOTAL, FREET4, T3FREE, THYROIDAB in the last 72 hours. Anemia Panel: No results for input(s): VITAMINB12, FOLATE, FERRITIN, TIBC, IRON, RETICCTPCT in the last 72 hours. Sepsis Labs: No results for input(s): PROCALCITON, LATICACIDVEN in the last 168 hours.  Recent Results (from the past 240 hour(s))  SARS Coronavirus 2 by RT PCR (hospital order, performed in [facility] hospital lab) Nasopharyngeal Nasopharyngeal Swab     Status: None   Collection Time: 11/04/19  5:11 PM   Specimen: Nasopharyngeal Swab  Result Value Ref Range Status   SARS Coronavirus 2 NEGATIVE NEGATIVE Final    Comment: (NOTE) SARS-CoV-2 target nucleic acids are NOT DETECTED.  The SARS-CoV-2 RNA is generally detectable in upper and lower respiratory specimens during the acute phase of infection. The lowest concentration of SARS-CoV-2 viral copies this assay can detect is 250 copies / mL. A negative result does not preclude SARS-CoV-2 infection and should not be used as the sole basis for treatment or other patient management decisions.  A negative result may occur with improper specimen collection / handling, submission of specimen other than nasopharyngeal swab, presence of viral mutation(s) within the areas targeted by this assay, and inadequate number of viral copies (<250 copies / mL). A negative result must be combined with clinical observations, patient history, and epidemiological information.  Fact Sheet for Patients:   StrictlyIdeas.no  Fact Sheet for Healthcare Providers: BankingDealers.co.za  This test is not yet approved or  cleared by the [place] FDA and has been authorized for detection and/or diagnosis of SARS-CoV-2 by FDA under an Emergency Use Authorization (EUA).  This  EUA will remain in effect (meaning this test can be used) for the duration of the COVID-19 declaration under Section 564(b)(1) of the Act, 21 U.S.C. section 360bbb-3(b)(1), unless the authorization is terminated or revoked sooner.  Performed at [facility], [place] [address], [place], [place] [address]   MRSA PCR Screening     Status: None   Collection Time: 11/04/19 11:48 PM   Specimen: Nasal Mucosa; Nasopharyngeal  Result Value Ref Range Status   MRSA by PCR NEGATIVE NEGATIVE Final    Comment:        The GeneXpert MRSA Assay (FDA approved for NASAL specimens only), is one component of a comprehensive MRSA colonization surveillance program. It is not intended to diagnose MRSA infection nor to guide or monitor treatment for MRSA infections. Performed at [facility], [place] [address] [place], [place] [address]          Radiology Studies: No results found.      Scheduled Meds: . Chlorhexidine Gluconate Cloth  6 each Topical Daily  . LORazepam  0.25 mg Oral BID  . mouth rinse  15 mL Mouth Rinse BID  . metoprolol tartrate  50 mg Oral BID  . ondansetron (ZOFRAN) IV  4 mg Intravenous Q6H   Continuous Infusions: . levETIRAcetam 500 mg (11/07/19 1022)     LOS: 3 days    Time spent: 35 min  Nicolette Bang, MD [facility]  If 7PM-7AM, please contact night-coverage  11/07/2019, 2:10 PM

## 2019-11-07 NOTE — Progress Notes (Signed)
Patient presents with nausea and vomiting during morning assessment. MD notified and anti-nausea medication was ordered.

## 2019-11-08 LAB — CBC WITH DIFFERENTIAL/PLATELET
Abs Immature Granulocytes: 0.03 10*3/uL (ref 0.00–0.07)
Basophils Absolute: 0 10*3/uL (ref 0.0–0.1)
Basophils Relative: 1 %
Eosinophils Absolute: 0.1 10*3/uL (ref 0.0–0.5)
Eosinophils Relative: 1 %
HCT: 46.5 % — ABNORMAL HIGH (ref 36.0–46.0)
Hemoglobin: 14.4 g/dL (ref 12.0–15.0)
Immature Granulocytes: 0 %
Lymphocytes Relative: 4 %
Lymphs Abs: 0.4 10*3/uL — ABNORMAL LOW (ref 0.7–4.0)
MCH: 29.9 pg (ref 26.0–34.0)
MCHC: 31 g/dL (ref 30.0–36.0)
MCV: 96.5 fL (ref 80.0–100.0)
Monocytes Absolute: 1 10*3/uL (ref 0.1–1.0)
Monocytes Relative: 12 %
Neutro Abs: 6.6 10*3/uL (ref 1.7–7.7)
Neutrophils Relative %: 82 %
Platelets: 376 10*3/uL (ref 150–400)
RBC: 4.82 MIL/uL (ref 3.87–5.11)
RDW: 20.1 % — ABNORMAL HIGH (ref 11.5–15.5)
WBC: 8.1 10*3/uL (ref 4.0–10.5)
nRBC: 0 % (ref 0.0–0.2)

## 2019-11-08 LAB — BASIC METABOLIC PANEL
Anion gap: 9 (ref 5–15)
BUN: 15 mg/dL (ref 8–23)
CO2: 30 mmol/L (ref 22–32)
Calcium: 8.9 mg/dL (ref 8.9–10.3)
Chloride: 95 mmol/L — ABNORMAL LOW (ref 98–111)
Creatinine, Ser: 0.5 mg/dL (ref 0.44–1.00)
GFR calc Af Amer: >60 mL/min (ref >60)
GFR calc non Af Amer: >60 mL/min (ref >60)
Glucose, Bld: 118 mg/dL — ABNORMAL HIGH (ref 70–99)
Potassium: 3.9 mmol/L (ref 3.5–5.1)
Sodium: 134 mmol/L — ABNORMAL LOW (ref 135–145)

## 2019-11-08 MED ORDER — BOOST / RESOURCE BREEZE PO LIQD CUSTOM
1.0000 | Freq: Three times a day (TID) | ORAL | Status: DC
  Administered 2019-11-08 – 2019-11-12 (×5): 1 via ORAL

## 2019-11-08 MED ORDER — MORPHINE SULFATE (PF) 2 MG/ML IV SOLN
1.0000 mg | INTRAVENOUS | Status: DC | PRN
  Administered 2019-11-08 – 2019-11-12 (×12): 1 mg via INTRAVENOUS
  Filled 2019-11-08 (×13): qty 1

## 2019-11-08 NOTE — Progress Notes (Signed)
PROGRESS NOTE    [name]  WUJ:[id number] DOB: [birth year] DOA: 11/04/2019 PCP: Colon Branch, MD   Brief Narrative:  Brief History   86 year old with multiple medical issues including Polycythemia vera, breast cancer, CAD, CVA, diverticulitis, GERD, CVA, OSA.  Admitted with mechanical fall after tripping over curb.. Found to have [place], [place] on CT imaging. She is not a candidate for surgical intervention. She had a seizure activity in ED with 1 min of full tonic-clonic activity and given ativan and loaded with keppra. PCCM consulted for admission    Assessment & Plan:   Active Problems:   ICH (intracerebral hemorrhage) (HCC)   Polycythemia vera, stage I ca of left breast s/p lumpectomy in 2013 Followed by onc with regular phlebotomy and IV iron Resume hydroxy urea when able to take PO  Acute encephalopathy due to hemorrhage, seizure Monitormental status.  On keppra No change in neuro Check cbc in AM  HTN -Lopressor resumed at 50 -Try to keep systolic BP below [phone]  New T4 compression fracture -Likely contributing to pain and discomfort -Planned T4 kypho monday  Anxiety Resumed Ativan 0.25 twice daily for anxiety  Ttransferred to progressive floor 6/25  DVT prophylaxis: SCD/Compression stockings  Code Status: fill    Code Status Orders  (From admission, onward)         Start     Ordered   11/04/19 1759  Full code  Continuous        11/04/19 1759        Code Status History    Date Active Date Inactive Code Status Order ID Comments User Context   09/13/2019 0816 09/17/2019 2300 Full Code [id number]  Vernelle Emerald, MD Inpatient   04/09/2011 1410 04/10/2011 1552 Full Code [id number]  Boykin Nearing, RN Inpatient   Advance Care Planning Activity     Family Communication: discussed with patient  Disposition Plan:    Status is: Inpatient  Remains inpatient appropriate because:Ongoing active pain requiring inpatient pain management and  Inpatient level of care appropriate due to severity of illness   Dispo: The patient is from: Home  Anticipated d/c is to: TBD  Anticipated d/c date is: 3 days  Patient currently is not medically stable to d/c.       Consults called: IR Admission status: Inpatient  Procedures:  DG Chest 2 View  Result Date: 11/04/2019 CLINICAL DATA:  Fall. EXAM: CHEST - 2 VIEW COMPARISON:  June 24, 2018. FINDINGS: Stable cardiomediastinal silhouette. Hiatal hernia is noted. No pneumothorax or pleural effusion is noted. Both lungs are clear. The visualized skeletal structures are unremarkable. IMPRESSION: No active cardiopulmonary disease. Hiatal hernia. Electronically Signed   By: Marijo Conception M.D.   On: 11/04/2019 13:20   DG Pelvis 1-2 Views  Result Date: 11/04/2019 CLINICAL DATA:  Fall EXAM: PELVIS - 1-2 VIEW COMPARISON:  None. FINDINGS: Osteopenia. There is no evidence of displaced pelvic fracture or diastasis. No pelvic bone lesions are seen. Possible bladder calculus or calcified uterine fibroid in the low left hemipelvis. IMPRESSION: Osteopenia. No acute displaced fracture or dislocation of the pelvis. Please note that plain radiographs are insensitive for hip and pelvic fracture, particularly in the setting of osteopenia. Consider CT or MRI to more sensitively evaluate for fracture if there is high clinical suspicion. Electronically Signed   By: Eddie Candle M.D.   On: 11/04/2019 13:22   CT HEAD WO CONTRAST  Result Date: 11/05/2019 CLINICAL DATA:  Intracranial hemorrhage.  Subdural hemorrhage. EXAM:  CT HEAD WITHOUT CONTRAST TECHNIQUE: Contiguous axial images were obtained from the base of the skull through the vertex without intravenous contrast. COMPARISON:  CT head without contrast 11/04/2019. FINDINGS: Brain: Hemorrhagic contusions involving the inferolateral right temporal lobe and anterior inferior right frontal lobe demonstrate expected  evolution and are more evident on today's exam. Hyperdense components are more prominent with some surrounding edema. Subarachnoid hemorrhage is evident at both locations. Subdural blood adjacent to the temporal lobe is stable. Anterior subdural hemorrhage is more visible on today's study extending to anterior falx. No new areas of hemorrhage are present. Moderate atrophy and white matter disease are again seen. The ventricles are of normal size. Vascular: Atherosclerotic calcifications are present within the cavernous internal carotid arteries bilaterally of the right vertebral artery. No hyperdense vessel is present. Skull: Calvarium is intact. Nondisplaced left occipital skull fracture is again seen. Fracture extends to the foramen magnum. No additional fractures are present. Skin staples are present over the left occipital scalp laceration. Sinuses/Orbits: The paranasal sinuses and mastoid air cells are clear. The globes and orbits are within normal limits. IMPRESSION: 1. Expected evolution of hemorrhagic contusions involving the inferolateral right temporal lobe and anterior inferior right frontal lobe. 2. Subarachnoid hemorrhage at both locations. 3. Stable subdural blood adjacent to the right temporal lobe. 4. Anterior subdural hemorrhage is more visible on today's study extending to the anterior falx. 5. Stable nondisplaced left occipital skull fracture. 6. Stable atrophy and white matter disease. Electronically Signed   By: San Morelle M.D.   On: 11/05/2019 05:53   CT Head Wo Contrast  Result Date: 11/04/2019 CLINICAL DATA:  Head trauma, headache. Poly trauma, critical, head/cervical spine injury suspected. Additional history provided: Mechanical fall landing on back, laceration to back of head. EXAM: CT HEAD WITHOUT CONTRAST CT CERVICAL SPINE WITHOUT CONTRAST TECHNIQUE: Multidetector CT imaging of the head and cervical spine was performed following the standard protocol without intravenous  contrast. Multiplanar CT image reconstructions of the cervical spine were also generated. COMPARISON:  Brain MRI 07/19/2018, head CT 07/01/2017, cervical spine MRI 05/04/2005. FINDINGS: CT HEAD FINDINGS Brain: There is acute extra-axial hemorrhage along the inferolateral right temporal lobe measuring up to 11 mm in thickness (for instance as seen on series 5, image 33 and series 3, image 8). Additionally, there is subtle hemorrhagic parenchymal contusion within the anterior right temporal lobe (series 3, image 5) (series 5, image 21). Trace acute subdural hemorrhage is also questioned along the right tentorium. Hemorrhagic parenchymal contusions within the anteroinferior right frontal lobe with overlying small volume acute subarachnoid hemorrhage (greater on the right). Stable moderate chronic small vessel ischemic changes within the cerebral white matter. Redemonstrated chronic right thalamic lacunar infarct. Stable mild generalized parenchymal atrophy. No evidence of intracranial mass. There is no hydrocephalus or midline shift. Vascular: No hyperdense vessel.  Atherosclerotic calcifications. Skull: There is a nondisplaced acute fracture within the left occipital calvarium extending inferiorly to the level of the foramen magnum. Sinuses/Orbits: Visualized orbits show no acute finding. No significant paranasal sinus disease or mastoid effusion at the imaged levels. CT CERVICAL SPINE FINDINGS Alignment: Straightening of the expected cervical lordosis. Trace C4-C5 grade 1 anterolisthesis. There is leftward rotation of C1 upon C2, which may be related to patient head positioning at the time of examination. Skull base and vertebrae: The basion-dental and atlanto-dental intervals are maintained.No evidence of acute fracture to the cervical spine. Congenital nonunion of the posterior arch of C1. Soft tissues and spinal canal: No prevertebral fluid or  swelling. No visible canal hematoma. Disc levels: Mild for age cervical  spondylosis. No high-grade bony spinal canal narrowing. Upper chest: No consolidation within the imaged lung apices. No visible pneumothorax. These results were called by telephone at the time of interpretation on 11/04/2019 at 3:51 pm to provider [facility] , who verbally acknowledged these results. IMPRESSION: CT head: 1. Hemorrhagic parenchymal contusions within the anteroinferior right frontal lobe with overlying small volume acute subarachnoid hemorrhage. 2. Subtle hemorrhagic parenchymal contusion is also present within the anterior right temporal lobe. 3. Acute extra-axial hemorrhage overlying the inferolateral right temporal lobe measuring up to 11 mm in thickness. Additionally, there is suspected trace acute subdural hemorrhage along the right tentorium. 4. Acute nondisplaced fracture of the left occipital calvarium extending inferiorly to the level of the foramen magnum. 5. Unchanged mild generalized parenchymal atrophy with moderate chronic small vessel ischemic disease. Redemonstrated chronic right thalamic lacunar infarct. CT cervical spine: 1. No evidence of acute fracture to the cervical spine. 2. Leftward rotation of C1 upon C2, which may be related to patient head positioning at the time of examination. Clinical correlation is recommended. 3. Mild for age cervical spondylosis. 4. Mild C4-C5 grade 1 anterolisthesis. Electronically Signed   By: Kellie Simmering DO   On: 11/04/2019 15:52   CT Cervical Spine Wo Contrast  Result Date: 11/04/2019 CLINICAL DATA:  Head trauma, headache. Poly trauma, critical, head/cervical spine injury suspected. Additional history provided: Mechanical fall landing on back, laceration to back of head. EXAM: CT HEAD WITHOUT CONTRAST CT CERVICAL SPINE WITHOUT CONTRAST TECHNIQUE: Multidetector CT imaging of the head and cervical spine was performed following the standard protocol without intravenous contrast. Multiplanar CT image reconstructions of the cervical spine were  also generated. COMPARISON:  Brain MRI 07/19/2018, head CT 07/01/2017, cervical spine MRI 05/04/2005. FINDINGS: CT HEAD FINDINGS Brain: There is acute extra-axial hemorrhage along the inferolateral right temporal lobe measuring up to 11 mm in thickness (for instance as seen on series 5, image 33 and series 3, image 8). Additionally, there is subtle hemorrhagic parenchymal contusion within the anterior right temporal lobe (series 3, image 5) (series 5, image 21). Trace acute subdural hemorrhage is also questioned along the right tentorium. Hemorrhagic parenchymal contusions within the anteroinferior right frontal lobe with overlying small volume acute subarachnoid hemorrhage (greater on the right). Stable moderate chronic small vessel ischemic changes within the cerebral white matter. Redemonstrated chronic right thalamic lacunar infarct. Stable mild generalized parenchymal atrophy. No evidence of intracranial mass. There is no hydrocephalus or midline shift. Vascular: No hyperdense vessel.  Atherosclerotic calcifications. Skull: There is a nondisplaced acute fracture within the left occipital calvarium extending inferiorly to the level of the foramen magnum. Sinuses/Orbits: Visualized orbits show no acute finding. No significant paranasal sinus disease or mastoid effusion at the imaged levels. CT CERVICAL SPINE FINDINGS Alignment: Straightening of the expected cervical lordosis. Trace C4-C5 grade 1 anterolisthesis. There is leftward rotation of C1 upon C2, which may be related to patient head positioning at the time of examination. Skull base and vertebrae: The basion-dental and atlanto-dental intervals are maintained.No evidence of acute fracture to the cervical spine. Congenital nonunion of the posterior arch of C1. Soft tissues and spinal canal: No prevertebral fluid or swelling. No visible canal hematoma. Disc levels: Mild for age cervical spondylosis. No high-grade bony spinal canal narrowing. Upper chest: No  consolidation within the imaged lung apices. No visible pneumothorax. These results were called by telephone at the time of interpretation on 11/04/2019 at 3:51  pm to provider [facility] , who verbally acknowledged these results. IMPRESSION: CT head: 1. Hemorrhagic parenchymal contusions within the anteroinferior right frontal lobe with overlying small volume acute subarachnoid hemorrhage. 2. Subtle hemorrhagic parenchymal contusion is also present within the anterior right temporal lobe. 3. Acute extra-axial hemorrhage overlying the inferolateral right temporal lobe measuring up to 11 mm in thickness. Additionally, there is suspected trace acute subdural hemorrhage along the right tentorium. 4. Acute nondisplaced fracture of the left occipital calvarium extending inferiorly to the level of the foramen magnum. 5. Unchanged mild generalized parenchymal atrophy with moderate chronic small vessel ischemic disease. Redemonstrated chronic right thalamic lacunar infarct. CT cervical spine: 1. No evidence of acute fracture to the cervical spine. 2. Leftward rotation of C1 upon C2, which may be related to patient head positioning at the time of examination. Clinical correlation is recommended. 3. Mild for age cervical spondylosis. 4. Mild C4-C5 grade 1 anterolisthesis. Electronically Signed   By: Kellie Simmering DO   On: 11/04/2019 15:52   CT THORACIC SPINE WO CONTRAST  Result Date: 11/05/2019 CLINICAL DATA:  Status post fall. History of prior compression fractures and vertebral augmentation. EXAM: CT THORACIC SPINE WITHOUT CONTRAST TECHNIQUE: Multidetector CT images of the thoracic were obtained using the standard protocol without intravenous contrast. COMPARISON:  MRI thoracic spine 09/14/2019. FINDINGS: Alignment: Maintained. Vertebrae: The patient has suffered a compression fracture of T4 with vertebral body height loss of approximately 60% since the prior study. Mild bony retropulsion off the mid aspect of T4 is  identified but the central canal appears open. The patient has undergone vertebral augmentation for the T9 fracture seen on the prior exam. No lytic or sclerotic lesion is identified. Bones are osteopenic. Degenerative endplate signal change anteriorly at T6-7 noted. Paraspinal and other soft tissues: Dependent atelectasis is seen. Calcific aortic and coronary atherosclerosis is noted. Calcified mediastinal and hilar lymph nodes consistent with old granulomatous disease are seen. The patient has a hiatal hernia. Disc levels: The central canal and foramina appear open at all levels. IMPRESSION: Acute or subacute T4 compression fracture with vertebral body height loss of approximately 60% is new since the comparison MRI. Mild bony retropulsion off the central aspect of T4 is noted but no central canal stenosis is seen. The fracture has an appearance most consistent with a senile osteoporotic injury. Status post T9 vertebral augmentation since the prior exam without complicating feature. Marked osteopenia. Calcific aortic and coronary atherosclerosis. Hiatal hernia. Electronically Signed   By: Inge Rise M.D.   On: 11/05/2019 13:07   CT LUMBAR SPINE WO CONTRAST  Result Date: 11/04/2019 CLINICAL DATA:  Low back and left leg pain after falling. History of spinal augmentation. EXAM: CT LUMBAR SPINE WITHOUT CONTRAST TECHNIQUE: Multidetector CT imaging of the lumbar spine was performed without intravenous contrast administration. Multiplanar CT image reconstructions were also generated. COMPARISON:  CT lumbar spine 09/10/2007. Lumbar MRI 10/27/2016. Lumbar radiographs 09/04/2019. FINDINGS: Segmentation: There are 5 lumbar type vertebral bodies. The bones are severely demineralized. Alignment: Normal. Vertebrae: No definite acute osseous findings are seen. The L2 vertebral body has a stable appearance post spinal augmentation. Cement extension into the L2-3 disc is unchanged. There are grossly stable biconcave L3  and superior endplate compression deformities at L4. Paraspinal and other soft tissues: No acute paraspinal findings. Diffuse aortic and branch vessel atherosclerosis and hepatomegaly are noted. Disc levels: No large disc herniation or acute disc space findings are identified. Chronic osseous retropulsion at the L2 compression fracture appears stable.  There is stable disc bulging, endplate osteophytes and facet hypertrophy in the lower lumbar spine, contributing to stable mild foraminal narrowing. IMPRESSION: 1. No acute osseous findings in the lumbar spine. 2. Stable chronic compression deformities at L2, L3 and L4. 3. Aortic Atherosclerosis (ICD10-I70.0). Electronically Signed   By: Richardean Sale M.D.   On: 11/04/2019 15:28   CT PELVIS WO CONTRAST  Result Date: 11/04/2019 CLINICAL DATA:  Low back and left leg pain after falling. History of spinal augmentation. EXAM: CT PELVIS WITHOUT CONTRAST TECHNIQUE: Multidetector CT imaging of the pelvis was performed following the standard protocol without intravenous contrast. COMPARISON:  Pelvic radiographs today.  Pelvic CT 07/01/2017. FINDINGS: Urinary Tract: The visualized distal ureters and bladder appear unremarkable. Bowel: No bowel wall thickening, distention or surrounding inflammation identified within the pelvis. Mild sigmoid colon diverticular changes. Vascular/Lymphatic: No enlarged pelvic lymph nodes identified. Diffuse aortoiliac atherosclerosis. Reproductive: The uterus and ovaries appear stable. There is a calcified anterior uterine fibroid. No adnexal mass. Other: No pelvic ascites. Musculoskeletal: There is new asymmetric enlargement of the left piriformis muscle (image 24/3), likely indicative of an acute hematoma. There is chronic atrophy of the right piriformis muscle. No other muscular abnormalities or hematomas are seen. The bones are diffusely demineralized. No evidence of acute fracture or dislocation. Stable sclerosis posteriorly in both  iliac bones and stable chronic posttraumatic deformities of both pubic rami. There is also deformity of the lower sacrum on the sagittal images which appears new compared with prior CT, although demonstrates no definite acute features. IMPRESSION: 1. New asymmetric enlargement of the left piriformis muscle, likely indicative of an acute hematoma. 2. No evidence of acute fracture or dislocation. Age indeterminate lower sacral deformity, not likely acute. 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 3. Aortic Atherosclerosis (ICD10-I70.0). Electronically Signed   By: Richardean Sale M.D.   On: 11/04/2019 15:35   DG Chest Port 1 View  Result Date: 11/05/2019 CLINICAL DATA:  Acute respiratory failure. EXAM: PORTABLE CHEST 1 VIEW COMPARISON:  CT of the chest 11/04/2019 FINDINGS: Heart is mildly enlarged. Increasing asymmetric left-sided edema and pulmonary vascular congestion is present. Left basilar airspace opacity is noted. IMPRESSION: 1. Increasing asymmetric left-sided edema and pulmonary vascular congestion. 2. Left basilar airspace disease. Electronically Signed   By: San Morelle M.D.   On: 11/05/2019 07:37   EEG adult  Result Date: 11/05/2019 Lora Havens, MD     11/05/2019  1:17 PM Patient Name: [name] MRN: [id number] Epilepsy Attending: Lora Havens Referring Physician/Provider: Dr. 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  EEG telemetry for seizures. Level of alertness: Awake, asleep AEDs during EEG study: Keppra Technical aspects: This EEG study was done with scalp electrodes positioned according to the 10-20 International system of electrode placement. Electrical activity was acquired at a sampling rate of 500Hz  and reviewed with a high frequency filter of 70Hz  and a low frequency filter of 1Hz . EEG data were recorded continuously and  digitally stored. Description: No clear posterior dominant rhythm was seen. Sleep was characterized by sleep spindles (12 to 14 Hz), maximal frontocentral region.  EEG showed continuous generalized and maximal right temporal region 3 to 6 Hz theta-delta slowing admixed with generalized 9-15 Hz alpha-beta activity.  Sharp transients were seen in the right frontal region. Hyperventilation and photic stimulation were not performed.   ABNORMALITY -Continuous slow, generalized and maximal right temporal region IMPRESSION: This  study is suggestive of cortical dysfunction in right temporal region likely secondary to underlying structural abnormality as well as mild to moderate diffuse encephalopathy, nonspecific etiology.  No seizures or definite epileptiform discharges were seen throughout the recording. Lora Havens   DG Femur Min 2 Views Left  Result Date: 11/04/2019 CLINICAL DATA:  Left leg pain secondary to a fall today. EXAM: LEFT FEMUR 2 VIEWS COMPARISON:  None. FINDINGS: There is no evidence of fracture or other focal bone lesions. Left knee arthroplasty. No evidence of loosening of the components. Soft tissues are unremarkable. IMPRESSION: No acute abnormality. Electronically Signed   By: Lorriane Shire M.D.   On: 11/04/2019 13:22     Antimicrobials:   none   Subjective: Reports pain 2/2 comp fx, decreased appetite  Objective: Vitals:   11/08/19 0417 11/08/19 0500 11/08/19 0803 11/08/19 1155  BP: (!) 172/75  (!) 179/89 (!) 178/80  Pulse: 72  77 74  Resp: 18  14 18   Temp: 98.7 F (37.1 C)  97.9 F (36.6 C) 98.1 F (36.7 C)  TempSrc: Oral  Oral Oral  SpO2: 100%  98% 100%  Weight:  71.6 kg    Height:        Intake/Output Summary (Last 24 hours) at 11/08/2019 1445 Last data filed at 11/08/2019 0417 Gross per 24 hour  Intake 0 ml  Output 700 ml  Net -700 ml   Filed Weights   11/06/19 0122 11/07/19 0500 11/08/19 0500  Weight: 69.7 kg 67.5 kg 71.6 kg     Examination:  General exam: Calm, mild uncomfortable with movement Respiratory system: Clear to auscultation. Respiratory effort normal. Cardiovascular system: S1 & S2 heard, RRR. No JVD, murmurs, rubs, gallops or clicks. No pedal edema. Gastrointestinal system: Abdomen is nondistended, soft and nontender. No organomegaly or masses felt. Normal bowel sounds heard. Central nervous system: Alert,  No focal neurological deficits-limited exam 2/2  Extremities: wwp, no edema. Skin: No new rashes, lesions or ulcers Psychiatry: poor insight, likely cog deficits/alz     Data Reviewed: I have personally reviewed following labs and imaging studies  CBC: Recent Labs  Lab 11/04/19 1700 11/05/19 0659 11/06/19 0145 11/08/19 0318  WBC 12.4* 12.2* 11.3* 8.1  NEUTROABS 10.9* 11.0* 10.1* 6.6  HGB 16.2* 15.1* 14.3 14.4  HCT 53.0* 49.0* 46.0 46.5*  MCV 96.5 95.3 95.2 96.5  PLT 424* 385 323 097   Basic Metabolic Panel: Recent Labs  Lab 11/04/19 1700 11/05/19 0659 11/06/19 0145 11/08/19 0318  NA 137 136 135 134*  K 4.1 3.9 3.8 3.9  CL 98 100 98 95*  CO2 25 24 26 30   GLUCOSE 220* 154* 153* 118*  BUN 8 9 12 15   CREATININE 0.70 0.68 0.64 0.50  CALCIUM 9.4 9.4 9.0 8.9  MG  --  2.2 2.2  --    GFR: Estimated Creatinine Clearance: 49.3 mL/min (by C-G formula based on SCr of 0.5 mg/dL). Liver Function Tests: Recent Labs  Lab 11/04/19 1700 11/05/19 0659 11/06/19 0145  AST 60* 34 21  ALT 34 28 20  ALKPHOS 71 57 52  BILITOT 2.4* 3.0* 2.3*  PROT 7.3 6.4* 5.8*  ALBUMIN 4.3 3.6 3.2*   No results for input(s): LIPASE, AMYLASE in the last 168 hours. No results for input(s): AMMONIA in the last 168 hours. Coagulation Profile: Recent Labs  Lab 11/04/19 1700  INR 1.1   Cardiac Enzymes: No results for input(s): CKTOTAL, CKMB, CKMBINDEX, TROPONINI in the last 168 hours. BNP (last 3 results)  No results for input(s): PROBNP in the last 8760 hours. HbA1C: No results for input(s):  HGBA1C in the last 72 hours. CBG: Recent Labs  Lab 11/05/19 0054 11/05/19 1918  GLUCAP 154* 101*   Lipid Profile: No results for input(s): CHOL, HDL, LDLCALC, TRIG, CHOLHDL, LDLDIRECT in the last 72 hours. Thyroid Function Tests: No results for input(s): TSH, T4TOTAL, FREET4, T3FREE, THYROIDAB in the last 72 hours. Anemia Panel: No results for input(s): VITAMINB12, FOLATE, FERRITIN, TIBC, IRON, RETICCTPCT in the last 72 hours. Sepsis Labs: No results for input(s): PROCALCITON, LATICACIDVEN in the last 168 hours.  Recent Results (from the past 240 hour(s))  SARS Coronavirus 2 by RT PCR (hospital order, performed in [facility] hospital lab) Nasopharyngeal Nasopharyngeal Swab     Status: None   Collection Time: 11/04/19  5:11 PM   Specimen: Nasopharyngeal Swab  Result Value Ref Range Status   SARS Coronavirus 2 NEGATIVE NEGATIVE Final    Comment: (NOTE) SARS-CoV-2 target nucleic acids are NOT DETECTED.  The SARS-CoV-2 RNA is generally detectable in upper and lower respiratory specimens during the acute phase of infection. The lowest concentration of SARS-CoV-2 viral copies this assay can detect is 250 copies / mL. A negative result does not preclude SARS-CoV-2 infection and should not be used as the sole basis for treatment or other patient management decisions.  A negative result may occur with improper specimen collection / handling, submission of specimen other than nasopharyngeal swab, presence of viral mutation(s) within the areas targeted by this assay, and inadequate number of viral copies (<250 copies / mL). A negative result must be combined with clinical observations, patient history, and epidemiological information.  Fact Sheet for Patients:   StrictlyIdeas.no  Fact Sheet for Healthcare Providers: BankingDealers.co.za  This test is not yet approved or  cleared by the [place] FDA and has been authorized for  detection and/or diagnosis of SARS-CoV-2 by FDA under an Emergency Use Authorization (EUA).  This EUA will remain in effect (meaning this test can be used) for the duration of the COVID-19 declaration under Section 564(b)(1) of the Act, 21 U.S.C. section 360bbb-3(b)(1), unless the authorization is terminated or revoked sooner.  Performed at [facility], [place] [address], [place], [place] [address]   MRSA PCR Screening     Status: None   Collection Time: 11/04/19 11:48 PM   Specimen: Nasal Mucosa; Nasopharyngeal  Result Value Ref Range Status   MRSA by PCR NEGATIVE NEGATIVE Final    Comment:        The GeneXpert MRSA Assay (FDA approved for NASAL specimens only), is one component of a comprehensive MRSA colonization surveillance program. It is not intended to diagnose MRSA infection nor to guide or monitor treatment for MRSA infections. Performed at [facility], [place] [address], [place], [place] [address]          Radiology Studies: No results found.      Scheduled Meds: . Chlorhexidine Gluconate Cloth  6 each Topical Daily  . LORazepam  0.25 mg Oral BID  . mouth rinse  15 mL Mouth Rinse BID  . metoprolol tartrate  50 mg Oral BID  . ondansetron (ZOFRAN) IV  4 mg Intravenous Q6H   Continuous Infusions: . levETIRAcetam 500 mg (11/08/19 1046)     LOS: 4 days    Time spent: 35 min     Nicolette Bang, MD [facility]  If 7PM-7AM, please contact night-coverage  11/08/2019, 2:45 PM

## 2019-11-09 ENCOUNTER — Telehealth: Payer: Self-pay

## 2019-11-09 ENCOUNTER — Inpatient Hospital Stay (HOSPITAL_COMMUNITY): Payer: Medicare Other

## 2019-11-09 DIAGNOSIS — M5440 Lumbago with sciatica, unspecified side: Secondary | ICD-10-CM

## 2019-11-09 DIAGNOSIS — W19XXXA Unspecified fall, initial encounter: Secondary | ICD-10-CM

## 2019-11-09 DIAGNOSIS — S22000A Wedge compression fracture of unspecified thoracic vertebra, initial encounter for closed fracture: Secondary | ICD-10-CM

## 2019-11-09 LAB — BASIC METABOLIC PANEL
Anion gap: 11 (ref 5–15)
BUN: 16 mg/dL (ref 8–23)
CO2: 25 mmol/L (ref 22–32)
Calcium: 8.9 mg/dL (ref 8.9–10.3)
Chloride: 97 mmol/L — ABNORMAL LOW (ref 98–111)
Creatinine, Ser: 0.54 mg/dL (ref 0.44–1.00)
GFR calc Af Amer: >60 mL/min (ref >60)
GFR calc non Af Amer: >60 mL/min (ref >60)
Glucose, Bld: 109 mg/dL — ABNORMAL HIGH (ref 70–99)
Potassium: 3.8 mmol/L (ref 3.5–5.1)
Sodium: 133 mmol/L — ABNORMAL LOW (ref 135–145)

## 2019-11-09 MED ORDER — VANCOMYCIN HCL IN DEXTROSE 1-5 GM/200ML-% IV SOLN
1000.0000 mg | Freq: Once | INTRAVENOUS | Status: AC
  Administered 2019-11-10: 1000 mg via INTRAVENOUS
  Filled 2019-11-09: qty 200

## 2019-11-09 MED ORDER — GELATIN ABSORBABLE 12-7 MM EX MISC
CUTANEOUS | Status: AC
  Filled 2019-11-09: qty 1

## 2019-11-09 MED ORDER — TOBRAMYCIN SULFATE 1.2 G IJ SOLR
INTRAMUSCULAR | Status: AC
  Filled 2019-11-09: qty 1.2

## 2019-11-09 MED ORDER — BUPIVACAINE HCL (PF) 0.5 % IJ SOLN
INTRAMUSCULAR | Status: AC
  Filled 2019-11-09: qty 30

## 2019-11-09 NOTE — Telephone Encounter (Signed)
FYI

## 2019-11-09 NOTE — Progress Notes (Signed)
NIR.  Patient was scheduled for an image-guided T4 KP/VP today in IR with Dr. Estanislado Pandy under MAC sedation. Unfortunately, patient was not able to lay prone secondary to pain. Because of this, procedure did not occur today and will be rescheduled with patient under general anesthesia- plan for image-guided T4 KP/VP with general anesthesia and Dr. Estanislado Pandy tentatively for tomorrow 11/10/2019 in IR at 1200. Patient will be NPO at midnight.  Please call NIR with questions/concerns.   Bea Graff Cayli Escajeda, PA-C 11/09/2019, 3:17 PM

## 2019-11-09 NOTE — Telephone Encounter (Signed)
Hoyle Sauer from [facility]  called in to let Dr. Larose Kells know that the patient is refusing service. Please give Hoyle Sauer a call back at [phone]

## 2019-11-09 NOTE — Progress Notes (Signed)
Patient ID: [name], female   DOB: 05-13-1930, 86 y.o.   MRN: [id number] INR. Patient  scheduled for vertebral augmentation at T 4 for pain relief . However patient in significant pain despite x 3 meds for pain relief.She  states pain too unbearable to get on to the table to lie prone for the procedure. Option of treatment under GA discussed to which the patient and the daughter and grandson are agreeable. Will schedule the procedure with anesthesia ASAP. S.Mao Lockner MD

## 2019-11-09 NOTE — Telephone Encounter (Signed)
Noted, thank you

## 2019-11-09 NOTE — Progress Notes (Signed)
Patient nurse informed that Vertebroplasty procedure scheduled for today was cancelled and will be rescheduled later this week. Patient states she is not able to tolerate moving on IR table or lay flat on stomach due to severe generalized pain. Patient family called by Dr. Estanislado Pandy and plan made to complete procedure under general anesthesia.

## 2019-11-09 NOTE — Progress Notes (Signed)
[facility]                                                                              Patient Demographics  [name], is a 86 y.o. female, DOB - [birth year], PXT:[id number]  Admit date - 11/04/2019   Admitting Physician Marshell Garfinkel, MD  Outpatient Primary MD for the patient is Colon Branch, MD  Outpatient specialists:   LOS - 5  days   Medical records reviewed and are as summarized below:    No chief complaint on file.      Brief summary   86 year old with multiple medical issues including Polycythemia vera, breast cancer, CAD, CVA, diverticulitis, GERD, CVA, OSA. Admitted with mechanical fall after tripping over curb.. Found to have [place] on CT imaging. She is not a candidate for surgical intervention. She had a seizure activity in ED with 1 min of full tonic-clonic activity and given ativan and loaded with keppra. PCCM consulted for admission   Assessment & Plan    Principal problem   ICH (intracerebral hemorrhage) ([place]) -Secondary to mechanical fall.    - CT imaging showed small right-sided temporal lobe SDH as well as hemorrhagic contusion.  -Seen by neurosurgery, surgical intervention is not warranted at this time -Neurosurgery recommended Keppra, follow-up 2 weeks after discharge for repeat CT of the brain   Acute metabolic encephalopathy, seizure activity -Patient had seizure activity in ED with 1 minute of full tonic-clonic activity and was given Ativan and loaded with Keppra.   -Seen by neurology, Dr Leonel Ramsay recommended EEG, Keppra -EEG showed cortical dysfunction in the right temporal region secondary to underlying structural abnormality, mild to moderate diffuse encephalopathy, nonspecific, no definitive epileptiform discharges. -Neurology recommended continue Keppra 500 mg twice daily for 1 month, outpatient neurology follow-up in 4 to 6 weeks  Essential hypertension  -BP currently stable, likely elevated due to pain  New  T4 compression fracture -Patient was seen by neurosurgery, did not recommend TLSO with cervical extension, recommended kyphoplasty.  Seen by trauma surgery as well -Plan for kyphoplasty today, will attempt PT tomorrow  -Continue pain control  Anxiety Continue lorazepam   Polycythemia vera, stage I ca of left breast s/p lumpectomy in 2013 Followed by oncology with regular phlebotomy and IV iron Resume hydroxy urea when able to take PO   Code Status: full DVT Prophylaxis:  SCD's Family Communication: Discussed all imaging results, lab results, explained to the patient   Disposition Plan:     Status is: Inpatient  Remains inpatient appropriate because:Inpatient level of care appropriate due to severity of illness   Dispo: The patient is from: Home              Anticipated d/c is to: TBD               Anticipated d/c date is: 2 days              Patient currently is not medically stable to d/c.  Pending kyphoplasty today, start PT in a.m. for evaluation and disposition planning      Time Spent in minutes 35 minutes  Procedures:  None  Consultants:   Neurosurgery Neurology CCM Trauma surgery  Antimicrobials:   Anti-infectives (From admission, onward)   None          Medications  Scheduled Meds: . feeding supplement  1 Container Oral TID BM  . LORazepam  0.25 mg Oral BID  . mouth rinse  15 mL Mouth Rinse BID  . metoprolol tartrate  50 mg Oral BID  . ondansetron (ZOFRAN) IV  4 mg Intravenous Q6H   Continuous Infusions: . levETIRAcetam 500 mg (11/09/19 1058)   PRN Meds:.alum & mag hydroxide-simeth, docusate sodium, fentaNYL (SUBLIMAZE) injection, hydrALAZINE, labetalol, morphine injection, ondansetron (ZOFRAN) IV, oxyCODONE-acetaminophen, polyethylene glycol      Subjective:   [name] was seen and examined today.  Complaining of pain everywhere, pending kyphoplasty today.  Patient denies dizziness, shortness of breath,  N/V/D/C, new weakness,  numbess, tingling. No acute events overnight.    Objective:   Vitals:   11/09/19 0444 11/09/19 0457 11/09/19 0802 11/09/19 1309  BP: (!) 131/55  (!) 130/47 (!) 157/68  Pulse: 83  84 74  Resp:   15 18  Temp:   98.1 F (36.7 C) 98.3 F (36.8 C)  TempSrc:   Oral Oral  SpO2:   96% 99%  Weight:  70.3 kg    Height:        Intake/Output Summary (Last 24 hours) at 11/09/2019 1311 Last data filed at 11/09/2019 0420 Gross per 24 hour  Intake 0 ml  Output 950 ml  Net -950 ml     Wt Readings from Last 3 Encounters:  11/09/19 70.3 kg  10/29/19 67.6 kg  10/23/19 68.5 kg     Exam  General: Alert and oriented x 3, uncomfortable  Cardiovascular: S1 S2 auscultated, no murmurs, RRR  Respiratory: Clear to auscultation bilaterally, no wheezing, rales or rhonchi  Gastrointestinal: Soft, nontender, nondistended, + bowel sounds  Ext: no pedal edema bilaterally  Neuro: No new deficits  Musculoskeletal: No digital cyanosis, clubbing  Skin: No rashes  Psych: Anxious   Data Reviewed:  I have personally reviewed following labs and imaging studies  Micro Results Recent Results (from the past 240 hour(s))  SARS Coronavirus 2 by RT PCR (hospital order, performed in [facility] hospital lab) Nasopharyngeal Nasopharyngeal Swab     Status: None   Collection Time: 11/04/19  5:11 PM   Specimen: Nasopharyngeal Swab  Result Value Ref Range Status   SARS Coronavirus 2 NEGATIVE NEGATIVE Final    Comment: (NOTE) SARS-CoV-2 target nucleic acids are NOT DETECTED.  The SARS-CoV-2 RNA is generally detectable in upper and lower respiratory specimens during the acute phase of infection. The lowest concentration of SARS-CoV-2 viral copies this assay can detect is 250 copies / mL. A negative result does not preclude SARS-CoV-2 infection and should not be used as the sole basis for treatment or other patient management decisions.  A negative result may occur with improper specimen collection /  handling, submission of specimen other than nasopharyngeal swab, presence of viral mutation(s) within the areas targeted by this assay, and inadequate number of viral copies (<250 copies / mL). A negative result must be combined with clinical observations, patient history, and epidemiological information.  Fact Sheet for Patients:   StrictlyIdeas.no  Fact Sheet for Healthcare Providers: BankingDealers.co.za  This test is not yet approved or  cleared by the [place] FDA and has been authorized for detection and/or diagnosis of SARS-CoV-2 by FDA under an Emergency Use Authorization (EUA).  This EUA will remain in  effect (meaning this test can be used) for the duration of the COVID-19 declaration under Section 564(b)(1) of the Act, 21 U.S.C. section 360bbb-3(b)(1), unless the authorization is terminated or revoked sooner.  Performed at [facility], [place] [address], [place], [place] [address]   MRSA PCR Screening     Status: None   Collection Time: 11/04/19 11:48 PM   Specimen: Nasal Mucosa; Nasopharyngeal  Result Value Ref Range Status   MRSA by PCR NEGATIVE NEGATIVE Final    Comment:        The GeneXpert MRSA Assay (FDA approved for NASAL specimens only), is one component of a comprehensive MRSA colonization surveillance program. It is not intended to diagnose MRSA infection nor to guide or monitor treatment for MRSA infections. Performed at [facility], [place] [address] [place], [place] [address]     Radiology Reports DG Chest 2 View  Result Date: 11/04/2019 CLINICAL DATA:  Fall. EXAM: CHEST - 2 VIEW COMPARISON:  June 24, 2018. FINDINGS: Stable cardiomediastinal silhouette. Hiatal hernia is noted. No pneumothorax or pleural effusion is noted. Both lungs are clear. The visualized skeletal structures are unremarkable. IMPRESSION: No active cardiopulmonary disease. Hiatal hernia. Electronically  Signed   By: Marijo Conception M.D.   On: 11/04/2019 13:20   DG Pelvis 1-2 Views  Result Date: 11/04/2019 CLINICAL DATA:  Fall EXAM: PELVIS - 1-2 VIEW COMPARISON:  None. FINDINGS: Osteopenia. There is no evidence of displaced pelvic fracture or diastasis. No pelvic bone lesions are seen. Possible bladder calculus or calcified uterine fibroid in the low left hemipelvis. IMPRESSION: Osteopenia. No acute displaced fracture or dislocation of the pelvis. Please note that plain radiographs are insensitive for hip and pelvic fracture, particularly in the setting of osteopenia. Consider CT or MRI to more sensitively evaluate for fracture if there is high clinical suspicion. Electronically Signed   By: Eddie Candle M.D.   On: 11/04/2019 13:22   CT HEAD WO CONTRAST  Result Date: 11/05/2019 CLINICAL DATA:  Intracranial hemorrhage.  Subdural hemorrhage. EXAM: CT HEAD WITHOUT CONTRAST TECHNIQUE: Contiguous axial images were obtained from the base of the skull through the vertex without intravenous contrast. COMPARISON:  CT head without contrast 11/04/2019. FINDINGS: Brain: Hemorrhagic contusions involving the inferolateral right temporal lobe and anterior inferior right frontal lobe demonstrate expected evolution and are more evident on today's exam. Hyperdense components are more prominent with some surrounding edema. Subarachnoid hemorrhage is evident at both locations. Subdural blood adjacent to the temporal lobe is stable. Anterior subdural hemorrhage is more visible on today's study extending to anterior falx. No new areas of hemorrhage are present. Moderate atrophy and white matter disease are again seen. The ventricles are of normal size. Vascular: Atherosclerotic calcifications are present within the cavernous internal carotid arteries bilaterally of the right vertebral artery. No hyperdense vessel is present. Skull: Calvarium is intact. Nondisplaced left occipital skull fracture is again seen. Fracture extends to  the foramen magnum. No additional fractures are present. Skin staples are present over the left occipital scalp laceration. Sinuses/Orbits: The paranasal sinuses and mastoid air cells are clear. The globes and orbits are within normal limits. IMPRESSION: 1. Expected evolution of hemorrhagic contusions involving the inferolateral right temporal lobe and anterior inferior right frontal lobe. 2. Subarachnoid hemorrhage at both locations. 3. Stable subdural blood adjacent to the right temporal lobe. 4. Anterior subdural hemorrhage is more visible on today's study extending to the anterior falx. 5. Stable nondisplaced left occipital skull fracture. 6. Stable atrophy  and white matter disease. Electronically Signed   By: San Morelle M.D.   On: 11/05/2019 05:53   CT Head Wo Contrast  Result Date: 11/04/2019 CLINICAL DATA:  Head trauma, headache. Poly trauma, critical, head/cervical spine injury suspected. Additional history provided: Mechanical fall landing on back, laceration to back of head. EXAM: CT HEAD WITHOUT CONTRAST CT CERVICAL SPINE WITHOUT CONTRAST TECHNIQUE: Multidetector CT imaging of the head and cervical spine was performed following the standard protocol without intravenous contrast. Multiplanar CT image reconstructions of the cervical spine were also generated. COMPARISON:  Brain MRI 07/19/2018, head CT 07/01/2017, cervical spine MRI 05/04/2005. FINDINGS: CT HEAD FINDINGS Brain: There is acute extra-axial hemorrhage along the inferolateral right temporal lobe measuring up to 11 mm in thickness (for instance as seen on series 5, image 33 and series 3, image 8). Additionally, there is subtle hemorrhagic parenchymal contusion within the anterior right temporal lobe (series 3, image 5) (series 5, image 21). Trace acute subdural hemorrhage is also questioned along the right tentorium. Hemorrhagic parenchymal contusions within the anteroinferior right frontal lobe with overlying small volume acute  subarachnoid hemorrhage (greater on the right). Stable moderate chronic small vessel ischemic changes within the cerebral white matter. Redemonstrated chronic right thalamic lacunar infarct. Stable mild generalized parenchymal atrophy. No evidence of intracranial mass. There is no hydrocephalus or midline shift. Vascular: No hyperdense vessel.  Atherosclerotic calcifications. Skull: There is a nondisplaced acute fracture within the left occipital calvarium extending inferiorly to the level of the foramen magnum. Sinuses/Orbits: Visualized orbits show no acute finding. No significant paranasal sinus disease or mastoid effusion at the imaged levels. CT CERVICAL SPINE FINDINGS Alignment: Straightening of the expected cervical lordosis. Trace C4-C5 grade 1 anterolisthesis. There is leftward rotation of C1 upon C2, which may be related to patient head positioning at the time of examination. Skull base and vertebrae: The basion-dental and atlanto-dental intervals are maintained.No evidence of acute fracture to the cervical spine. Congenital nonunion of the posterior arch of C1. Soft tissues and spinal canal: No prevertebral fluid or swelling. No visible canal hematoma. Disc levels: Mild for age cervical spondylosis. No high-grade bony spinal canal narrowing. Upper chest: No consolidation within the imaged lung apices. No visible pneumothorax. These results were called by telephone at the time of interpretation on 11/04/2019 at 3:51 pm to provider [facility] , who verbally acknowledged these results. IMPRESSION: CT head: 1. Hemorrhagic parenchymal contusions within the anteroinferior right frontal lobe with overlying small volume acute subarachnoid hemorrhage. 2. Subtle hemorrhagic parenchymal contusion is also present within the anterior right temporal lobe. 3. Acute extra-axial hemorrhage overlying the inferolateral right temporal lobe measuring up to 11 mm in thickness. Additionally, there is suspected trace acute  subdural hemorrhage along the right tentorium. 4. Acute nondisplaced fracture of the left occipital calvarium extending inferiorly to the level of the foramen magnum. 5. Unchanged mild generalized parenchymal atrophy with moderate chronic small vessel ischemic disease. Redemonstrated chronic right thalamic lacunar infarct. CT cervical spine: 1. No evidence of acute fracture to the cervical spine. 2. Leftward rotation of C1 upon C2, which may be related to patient head positioning at the time of examination. Clinical correlation is recommended. 3. Mild for age cervical spondylosis. 4. Mild C4-C5 grade 1 anterolisthesis. Electronically Signed   By: Kellie Simmering DO   On: 11/04/2019 15:52   CT Cervical Spine Wo Contrast  Result Date: 11/04/2019 CLINICAL DATA:  Head trauma, headache. Poly trauma, critical, head/cervical spine injury suspected. Additional history provided: Mechanical fall  landing on back, laceration to back of head. EXAM: CT HEAD WITHOUT CONTRAST CT CERVICAL SPINE WITHOUT CONTRAST TECHNIQUE: Multidetector CT imaging of the head and cervical spine was performed following the standard protocol without intravenous contrast. Multiplanar CT image reconstructions of the cervical spine were also generated. COMPARISON:  Brain MRI 07/19/2018, head CT 07/01/2017, cervical spine MRI 05/04/2005. FINDINGS: CT HEAD FINDINGS Brain: There is acute extra-axial hemorrhage along the inferolateral right temporal lobe measuring up to 11 mm in thickness (for instance as seen on series 5, image 33 and series 3, image 8). Additionally, there is subtle hemorrhagic parenchymal contusion within the anterior right temporal lobe (series 3, image 5) (series 5, image 21). Trace acute subdural hemorrhage is also questioned along the right tentorium. Hemorrhagic parenchymal contusions within the anteroinferior right frontal lobe with overlying small volume acute subarachnoid hemorrhage (greater on the right). Stable moderate chronic  small vessel ischemic changes within the cerebral white matter. Redemonstrated chronic right thalamic lacunar infarct. Stable mild generalized parenchymal atrophy. No evidence of intracranial mass. There is no hydrocephalus or midline shift. Vascular: No hyperdense vessel.  Atherosclerotic calcifications. Skull: There is a nondisplaced acute fracture within the left occipital calvarium extending inferiorly to the level of the foramen magnum. Sinuses/Orbits: Visualized orbits show no acute finding. No significant paranasal sinus disease or mastoid effusion at the imaged levels. CT CERVICAL SPINE FINDINGS Alignment: Straightening of the expected cervical lordosis. Trace C4-C5 grade 1 anterolisthesis. There is leftward rotation of C1 upon C2, which may be related to patient head positioning at the time of examination. Skull base and vertebrae: The basion-dental and atlanto-dental intervals are maintained.No evidence of acute fracture to the cervical spine. Congenital nonunion of the posterior arch of C1. Soft tissues and spinal canal: No prevertebral fluid or swelling. No visible canal hematoma. Disc levels: Mild for age cervical spondylosis. No high-grade bony spinal canal narrowing. Upper chest: No consolidation within the imaged lung apices. No visible pneumothorax. These results were called by telephone at the time of interpretation on 11/04/2019 at 3:51 pm to provider [facility] , who verbally acknowledged these results. IMPRESSION: CT head: 1. Hemorrhagic parenchymal contusions within the anteroinferior right frontal lobe with overlying small volume acute subarachnoid hemorrhage. 2. Subtle hemorrhagic parenchymal contusion is also present within the anterior right temporal lobe. 3. Acute extra-axial hemorrhage overlying the inferolateral right temporal lobe measuring up to 11 mm in thickness. Additionally, there is suspected trace acute subdural hemorrhage along the right tentorium. 4. Acute nondisplaced  fracture of the left occipital calvarium extending inferiorly to the level of the foramen magnum. 5. Unchanged mild generalized parenchymal atrophy with moderate chronic small vessel ischemic disease. Redemonstrated chronic right thalamic lacunar infarct. CT cervical spine: 1. No evidence of acute fracture to the cervical spine. 2. Leftward rotation of C1 upon C2, which may be related to patient head positioning at the time of examination. Clinical correlation is recommended. 3. Mild for age cervical spondylosis. 4. Mild C4-C5 grade 1 anterolisthesis. Electronically Signed   By: Kellie Simmering DO   On: 11/04/2019 15:52   CT THORACIC SPINE WO CONTRAST  Result Date: 11/05/2019 CLINICAL DATA:  Status post fall. History of prior compression fractures and vertebral augmentation. EXAM: CT THORACIC SPINE WITHOUT CONTRAST TECHNIQUE: Multidetector CT images of the thoracic were obtained using the standard protocol without intravenous contrast. COMPARISON:  MRI thoracic spine 09/14/2019. FINDINGS: Alignment: Maintained. Vertebrae: The patient has suffered a compression fracture of T4 with vertebral body height loss of approximately 60% since  the prior study. Mild bony retropulsion off the mid aspect of T4 is identified but the central canal appears open. The patient has undergone vertebral augmentation for the T9 fracture seen on the prior exam. No lytic or sclerotic lesion is identified. Bones are osteopenic. Degenerative endplate signal change anteriorly at T6-7 noted. Paraspinal and other soft tissues: Dependent atelectasis is seen. Calcific aortic and coronary atherosclerosis is noted. Calcified mediastinal and hilar lymph nodes consistent with old granulomatous disease are seen. The patient has a hiatal hernia. Disc levels: The central canal and foramina appear open at all levels. IMPRESSION: Acute or subacute T4 compression fracture with vertebral body height loss of approximately 60% is new since the comparison MRI.  Mild bony retropulsion off the central aspect of T4 is noted but no central canal stenosis is seen. The fracture has an appearance most consistent with a senile osteoporotic injury. Status post T9 vertebral augmentation since the prior exam without complicating feature. Marked osteopenia. Calcific aortic and coronary atherosclerosis. Hiatal hernia. Electronically Signed   By: Inge Rise M.D.   On: 11/05/2019 13:07   CT LUMBAR SPINE WO CONTRAST  Result Date: 11/04/2019 CLINICAL DATA:  Low back and left leg pain after falling. History of spinal augmentation. EXAM: CT LUMBAR SPINE WITHOUT CONTRAST TECHNIQUE: Multidetector CT imaging of the lumbar spine was performed without intravenous contrast administration. Multiplanar CT image reconstructions were also generated. COMPARISON:  CT lumbar spine 09/10/2007. Lumbar MRI 10/27/2016. Lumbar radiographs 09/04/2019. FINDINGS: Segmentation: There are 5 lumbar type vertebral bodies. The bones are severely demineralized. Alignment: Normal. Vertebrae: No definite acute osseous findings are seen. The L2 vertebral body has a stable appearance post spinal augmentation. Cement extension into the L2-3 disc is unchanged. There are grossly stable biconcave L3 and superior endplate compression deformities at L4. Paraspinal and other soft tissues: No acute paraspinal findings. Diffuse aortic and branch vessel atherosclerosis and hepatomegaly are noted. Disc levels: No large disc herniation or acute disc space findings are identified. Chronic osseous retropulsion at the L2 compression fracture appears stable. There is stable disc bulging, endplate osteophytes and facet hypertrophy in the lower lumbar spine, contributing to stable mild foraminal narrowing. IMPRESSION: 1. No acute osseous findings in the lumbar spine. 2. Stable chronic compression deformities at L2, L3 and L4. 3. Aortic Atherosclerosis (ICD10-I70.0). Electronically Signed   By: Richardean Sale M.D.   On:  11/04/2019 15:28   CT PELVIS WO CONTRAST  Result Date: 11/04/2019 CLINICAL DATA:  Low back and left leg pain after falling. History of spinal augmentation. EXAM: CT PELVIS WITHOUT CONTRAST TECHNIQUE: Multidetector CT imaging of the pelvis was performed following the standard protocol without intravenous contrast. COMPARISON:  Pelvic radiographs today.  Pelvic CT 07/01/2017. FINDINGS: Urinary Tract: The visualized distal ureters and bladder appear unremarkable. Bowel: No bowel wall thickening, distention or surrounding inflammation identified within the pelvis. Mild sigmoid colon diverticular changes. Vascular/Lymphatic: No enlarged pelvic lymph nodes identified. Diffuse aortoiliac atherosclerosis. Reproductive: The uterus and ovaries appear stable. There is a calcified anterior uterine fibroid. No adnexal mass. Other: No pelvic ascites. Musculoskeletal: There is new asymmetric enlargement of the left piriformis muscle (image 24/3), likely indicative of an acute hematoma. There is chronic atrophy of the right piriformis muscle. No other muscular abnormalities or hematomas are seen. The bones are diffusely demineralized. No evidence of acute fracture or dislocation. Stable sclerosis posteriorly in both iliac bones and stable chronic posttraumatic deformities of both pubic rami. There is also deformity of the lower sacrum on the sagittal  images which appears new compared with prior CT, although demonstrates no definite acute features. IMPRESSION: 1. New asymmetric enlargement of the left piriformis muscle, likely indicative of an acute hematoma. 2. No evidence of acute fracture or dislocation. Age indeterminate lower sacral deformity, not likely acute. 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 3. Aortic Atherosclerosis (ICD10-I70.0). Electronically Signed   By: Richardean Sale M.D.   On: 11/04/2019 15:35   DG Chest Port 1 View  Result Date: 11/05/2019 CLINICAL DATA:  Acute respiratory failure. EXAM:  PORTABLE CHEST 1 VIEW COMPARISON:  CT of the chest 11/04/2019 FINDINGS: Heart is mildly enlarged. Increasing asymmetric left-sided edema and pulmonary vascular congestion is present. Left basilar airspace opacity is noted. IMPRESSION: 1. Increasing asymmetric left-sided edema and pulmonary vascular congestion. 2. Left basilar airspace disease. Electronically Signed   By: San Morelle M.D.   On: 11/05/2019 07:37   EEG adult  Result Date: 11/05/2019 Lora Havens, MD     11/05/2019  1:17 PM Patient Name: Lylliana Kitamura MRN: [id number] Epilepsy Attending: Lora Havens Referring Physician/Provider: Dr. 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  EEG telemetry for seizures. Level of alertness: Awake, asleep AEDs during EEG study: Keppra Technical aspects: This EEG study was done with scalp electrodes positioned according to the 10-20 International system of electrode placement. Electrical activity was acquired at a sampling rate of 500Hz  and reviewed with a high frequency filter of 70Hz  and a low frequency filter of 1Hz . EEG data were recorded continuously and digitally stored. Description: No clear posterior dominant rhythm was seen. Sleep was characterized by sleep spindles (12 to 14 Hz), maximal frontocentral region.  EEG showed continuous generalized and maximal right temporal region 3 to 6 Hz theta-delta slowing admixed with generalized 9-15 Hz alpha-beta activity.  Sharp transients were seen in the right frontal region. Hyperventilation and photic stimulation were not performed.   ABNORMALITY -Continuous slow, generalized and maximal right temporal region IMPRESSION: This study is suggestive of cortical dysfunction in right temporal region likely secondary to underlying structural abnormality as well as mild to moderate diffuse encephalopathy, nonspecific etiology.  No  seizures or definite epileptiform discharges were seen throughout the recording. Lora Havens   DG Femur Min 2 Views Left  Result Date: 11/04/2019 CLINICAL DATA:  Left leg pain secondary to a fall today. EXAM: LEFT FEMUR 2 VIEWS COMPARISON:  None. FINDINGS: There is no evidence of fracture or other focal bone lesions. Left knee arthroplasty. No evidence of loosening of the components. Soft tissues are unremarkable. IMPRESSION: No acute abnormality. Electronically Signed   By: Lorriane Shire M.D.   On: 11/04/2019 13:22    Lab Data:  CBC: Recent Labs  Lab 11/04/19 1700 11/05/19 0659 11/06/19 0145 11/08/19 0318  WBC 12.4* 12.2* 11.3* 8.1  NEUTROABS 10.9* 11.0* 10.1* 6.6  HGB 16.2* 15.1* 14.3 14.4  HCT 53.0* 49.0* 46.0 46.5*  MCV 96.5 95.3 95.2 96.5  PLT 424* 385 323 073   Basic Metabolic Panel: Recent Labs  Lab 11/04/19 1700 11/05/19 0659 11/06/19 0145 11/08/19 0318 11/09/19 0236  NA 137 136 135 134* 133*  K 4.1 3.9 3.8 3.9 3.8  CL 98 100 98 95* 97*  CO2 25 24 26 30 25   GLUCOSE 220* 154* 153* 118* 109*  BUN 8 9 12 15 16   CREATININE 0.70 0.68 0.64 0.50 0.54  CALCIUM 9.4 9.4 9.0 8.9 8.9  MG  --  2.2 2.2  --   --    GFR: Estimated Creatinine Clearance: 45.5 mL/min (by C-G formula based on SCr of 0.54 mg/dL). Liver Function Tests: Recent Labs  Lab 11/04/19 1700 11/05/19 0659 11/06/19 0145  AST 60* 34 21  ALT 34 28 20  ALKPHOS 71 57 52  BILITOT 2.4* 3.0* 2.3*  PROT 7.3 6.4* 5.8*  ALBUMIN 4.3 3.6 3.2*   No results for input(s): LIPASE, AMYLASE in the last 168 hours. No results for input(s): AMMONIA in the last 168 hours. Coagulation Profile: Recent Labs  Lab 11/04/19 1700  INR 1.1   Cardiac Enzymes: No results for input(s): CKTOTAL, CKMB, CKMBINDEX, TROPONINI in the last 168 hours. BNP (last 3 results) No results for input(s): PROBNP in the last 8760 hours. HbA1C: No results for input(s): HGBA1C in the last 72 hours. CBG: Recent Labs  Lab 11/05/19  0054 11/05/19 1918  GLUCAP 154* 101*   Lipid Profile: No results for input(s): CHOL, HDL, LDLCALC, TRIG, CHOLHDL, LDLDIRECT in the last 72 hours. Thyroid Function Tests: No results for input(s): TSH, T4TOTAL, FREET4, T3FREE, THYROIDAB in the last 72 hours. Anemia Panel: No results for input(s): VITAMINB12, FOLATE, FERRITIN, TIBC, IRON, RETICCTPCT in the last 72 hours. Urine analysis:    Component Value Date/Time   COLORURINE YELLOW 11/06/2019 1638   APPEARANCEUR CLEAR 11/06/2019 1638   LABSPEC 1.017 11/06/2019 1638   LABSPEC 1.010 05/27/2013 1433   PHURINE 6.0 11/06/2019 1638   GLUCOSEU 50 (A) 11/06/2019 1638   GLUCOSEU NEGATIVE 09/02/2017 1347   HGBUR NEGATIVE 11/06/2019 1638   HGBUR negative 10/06/2009 1015   BILIRUBINUR NEGATIVE 11/06/2019 1638   BILIRUBINUR Negative 01/21/2018 1618   KETONESUR 5 (A) 11/06/2019 1638   PROTEINUR 30 (A) 11/06/2019 1638   UROBILINOGEN negative (A) 01/21/2018 1618   UROBILINOGEN 0.2 09/02/2017 1347   UROBILINOGEN 0.2 05/27/2013 1433   NITRITE NEGATIVE 11/06/2019 1638   LEUKOCYTESUR NEGATIVE 11/06/2019 1638     Toddy Boyd M.D. [facility] 11/09/2019, 1:11 PM   Call night coverage person covering after 7pm

## 2019-11-10 ENCOUNTER — Other Ambulatory Visit: Payer: Self-pay | Admitting: Radiology

## 2019-11-10 ENCOUNTER — Encounter (HOSPITAL_COMMUNITY)
Admission: AD | Disposition: A | Discharge status: Skilled Nursing Facility | Payer: Self-pay | Source: Home / Self Care | Attending: Pulmonary Disease

## 2019-11-10 ENCOUNTER — Inpatient Hospital Stay (HOSPITAL_COMMUNITY): Payer: Medicare Other | Admitting: Anesthesiology

## 2019-11-10 ENCOUNTER — Encounter (HOSPITAL_COMMUNITY): Payer: Self-pay | Admitting: Pulmonary Disease

## 2019-11-10 ENCOUNTER — Inpatient Hospital Stay (HOSPITAL_COMMUNITY): Payer: Medicare Other

## 2019-11-10 DIAGNOSIS — I619 Nontraumatic intracerebral hemorrhage, unspecified: Secondary | ICD-10-CM

## 2019-11-10 HISTORY — PX: IR VERTEBROPLASTY CERV/THOR BX INC UNI/BIL INC/INJECT/IMAGING: IMG5515

## 2019-11-10 HISTORY — PX: RADIOLOGY WITH ANESTHESIA: SHX6223

## 2019-11-10 LAB — BASIC METABOLIC PANEL
Anion gap: 11 (ref 5–15)
BUN: 21 mg/dL (ref 8–23)
CO2: 28 mmol/L (ref 22–32)
Calcium: 9 mg/dL (ref 8.9–10.3)
Chloride: 95 mmol/L — ABNORMAL LOW (ref 98–111)
Creatinine, Ser: 0.57 mg/dL (ref 0.44–1.00)
GFR calc Af Amer: >60 mL/min (ref >60)
GFR calc non Af Amer: >60 mL/min (ref >60)
Glucose, Bld: 112 mg/dL — ABNORMAL HIGH (ref 70–99)
Potassium: 3.8 mmol/L (ref 3.5–5.1)
Sodium: 134 mmol/L — ABNORMAL LOW (ref 135–145)

## 2019-11-10 SURGERY — MRI WITH ANESTHESIA
Anesthesia: General

## 2019-11-10 MED ORDER — ACETAMINOPHEN 500 MG PO TABS: 1000.0000 mg | ORAL_TABLET | Freq: Once | ORAL | Status: DC

## 2019-11-10 MED ORDER — TOBRAMYCIN SULFATE 1.2 G IJ SOLR
INTRAMUSCULAR | Status: AC
  Filled 2019-11-10: qty 1.2

## 2019-11-10 MED ORDER — ROCURONIUM BROMIDE 10 MG/ML (PF) SYRINGE
PREFILLED_SYRINGE | INTRAVENOUS | Status: DC | PRN
  Administered 2019-11-10: 40 mg via INTRAVENOUS

## 2019-11-10 MED ORDER — IOHEXOL 300 MG/ML  SOLN
50.0000 mL | Freq: Once | INTRAMUSCULAR | Status: AC | PRN
  Administered 2019-11-10: 10 mL via INTRATHECAL

## 2019-11-10 MED ORDER — SODIUM CHLORIDE 0.9 % IV SOLN: INTRAVENOUS | Status: AC

## 2019-11-10 MED ORDER — LIDOCAINE 2% (20 MG/ML) 5 ML SYRINGE
INTRAMUSCULAR | Status: DC | PRN
  Administered 2019-11-10: 60 mg via INTRAVENOUS

## 2019-11-10 MED ORDER — CHLORHEXIDINE GLUCONATE 0.12 % MT SOLN
15.0000 mL | Freq: Once | OROMUCOSAL | Status: AC
  Filled 2019-11-10: qty 15

## 2019-11-10 MED ORDER — FENTANYL CITRATE (PF) 250 MCG/5ML IJ SOLN
INTRAMUSCULAR | Status: AC
  Filled 2019-11-10: qty 5

## 2019-11-10 MED ORDER — PROPOFOL 10 MG/ML IV BOLUS
INTRAVENOUS | Status: DC | PRN
  Administered 2019-11-10: 50 mg via INTRAVENOUS

## 2019-11-10 MED ORDER — AMISULPRIDE (ANTIEMETIC) 5 MG/2ML IV SOLN: 5.0000 mg | Freq: Once | INTRAVENOUS | Status: AC

## 2019-11-10 MED ORDER — PHENYLEPHRINE HCL (PRESSORS) 10 MG/ML IV SOLN
INTRAVENOUS | Status: DC | PRN
  Administered 2019-11-10 (×4): 80 ug via INTRAVENOUS

## 2019-11-10 MED ORDER — FENTANYL CITRATE (PF) 250 MCG/5ML IJ SOLN
INTRAMUSCULAR | Status: DC | PRN
  Administered 2019-11-10 (×3): 50 ug via INTRAVENOUS

## 2019-11-10 MED ORDER — FENTANYL CITRATE (PF) 100 MCG/2ML IJ SOLN
INTRAMUSCULAR | Status: AC
  Administered 2019-11-10: 25 ug via INTRAVENOUS
  Filled 2019-11-10: qty 2

## 2019-11-10 MED ORDER — DEXAMETHASONE SODIUM PHOSPHATE 10 MG/ML IJ SOLN
INTRAMUSCULAR | Status: DC | PRN
  Administered 2019-11-10: 5 mg via INTRAVENOUS

## 2019-11-10 MED ORDER — ONDANSETRON HCL 4 MG/2ML IJ SOLN
INTRAMUSCULAR | Status: DC | PRN
  Administered 2019-11-10: 4 mg via INTRAVENOUS

## 2019-11-10 MED ORDER — AMISULPRIDE (ANTIEMETIC) 5 MG/2ML IV SOLN
INTRAVENOUS | Status: AC
  Administered 2019-11-10: 5 mg via INTRAVENOUS
  Filled 2019-11-10: qty 2

## 2019-11-10 MED ORDER — LACTATED RINGERS IV SOLN: INTRAVENOUS | Status: DC

## 2019-11-10 MED ORDER — BUPIVACAINE HCL (PF) 0.5 % IJ SOLN
INTRAMUSCULAR | Status: DC | PRN
  Administered 2019-11-10: 10 mL

## 2019-11-10 MED ORDER — GELATIN ABSORBABLE 12-7 MM EX MISC
CUTANEOUS | Status: AC
  Filled 2019-11-10: qty 1

## 2019-11-10 MED ORDER — CHLORHEXIDINE GLUCONATE 0.12 % MT SOLN
OROMUCOSAL | Status: AC
  Administered 2019-11-10: 15 mL via OROMUCOSAL
  Filled 2019-11-10: qty 15

## 2019-11-10 MED ORDER — FENTANYL CITRATE (PF) 100 MCG/2ML IJ SOLN
25.0000 ug | INTRAMUSCULAR | Status: DC | PRN
  Administered 2019-11-10 (×3): 25 ug via INTRAVENOUS

## 2019-11-10 MED ORDER — BUPIVACAINE HCL (PF) 0.5 % IJ SOLN
INTRAMUSCULAR | Status: AC
  Filled 2019-11-10: qty 30

## 2019-11-10 MED ORDER — SUGAMMADEX SODIUM 200 MG/2ML IV SOLN
INTRAVENOUS | Status: DC | PRN
  Administered 2019-11-10: 100 mg via INTRAVENOUS
  Administered 2019-11-10 (×2): 50 mg via INTRAVENOUS

## 2019-11-10 NOTE — Progress Notes (Signed)
After surgery, she came back to the room and has been much calmer than she was before.  No yelling and anxiety at this time.  No pain at this time.

## 2019-11-10 NOTE — TOC Initial Note (Signed)
Transition of Care [facility]) - Initial/Assessment Note    Patient Details  Name: [name] MRN: [id number] Date of Birth: Jan 05, 1930  [facility] CM/SW Contact:    Pollie Friar, RN Phone Number: 11/10/2019, 4:01 PM  Clinical Narrative:                 Pt is s/p kyphoplasty. CM met with her daughter and she is interested in her mother attending SNF rehab at [facility]. CM has completed FL2. Will need therapy notes.  TOC following.  Expected Discharge Plan: Skilled Nursing Facility Barriers to Discharge: Continued Medical Work up   Patient Goals and CMS Choice   CMS Medicare.gov Compare Post Acute Care list provided to:: Patient Represenative (must comment) Choice offered to / list presented to : Patient, Adult Children  Expected Discharge Plan and Services Expected Discharge Plan: [place] In-house Referral: Clinical Social Work Discharge Planning Services: CM Consult Post Acute Care Choice: [place] Living arrangements for the past 2 months: [place]                                      Prior Living Arrangements/Services Living arrangements for the past 2 months: Single Family Home Lives with:: Self Patient language and need for interpreter reviewed:: Yes        Need for Family Participation in Patient Care: Yes (Comment) Care giver support system in place?: No (comment)   Criminal Activity/Legal Involvement Pertinent to Current Situation/Hospitalization: No - Comment as needed  Activities of Daily Living Home Assistive Devices/Equipment: Cane (specify quad or straight), Eyeglasses ADL Screening (condition at time of admission) Patient's cognitive ability adequate to safely complete daily activities?: No (was ok prior to fall) Is the patient deaf or have difficulty hearing?: No Does the patient have difficulty seeing, even when wearing glasses/contacts?: No Does the patient have difficulty  concentrating, remembering, or making decisions?: No (not prior to this illness) Patient able to express need for assistance with ADLs?: No (was able to talk prior to this fall) Does the patient have difficulty dressing or bathing?: Yes Independently performs ADLs?: No Communication: Needs assistance Is this a change from baseline?: Change from baseline, expected to last >3 days Dressing (OT): Needs assistance Is this a change from baseline?: Change from baseline, expected to last >3 days Grooming: Needs assistance Is this a change from baseline?: Change from baseline, expected to last >3 days Feeding: Needs assistance Is this a change from baseline?: Change from baseline, expected to last >3 days Bathing: Needs assistance Is this a change from baseline?: Change from baseline, expected to last >3 days Toileting: Needs assistance Is this a change from baseline?: Change from baseline, expected to last >3days In/Out Bed: Needs assistance Is this a change from baseline?: Change from baseline, expected to last >3 days Walks in Home: Needs assistance Is this a change from baseline?: Change from baseline, expected to last >3 days Does the patient have difficulty walking or climbing stairs?: Yes Weakness of Legs: Both Weakness of Arms/Hands: Both  Permission Sought/Granted                  Emotional Assessment Appearance:: Appears stated age     Orientation: : Oriented to Self, Oriented to Place, Oriented to  Time, Oriented to Situation   Psych Involvement: No (comment)  Admission diagnosis:  Subarachnoid hemorrhage ([place]) [I60.9] Back  pain [M54.9] ICH (intracerebral hemorrhage) ([place]) [I61.9] Intraparenchymal hemorrhage of brain ([place]) [I61.9] Fall, initial encounter [W19.XXXA] Hematoma of left hip, initial encounter [S70.02XA] Patient Active Problem List   Diagnosis Date Noted  . ICH (intracerebral hemorrhage) ([place]) 11/04/2019  . Intractable back pain 09/13/2019  .  Compression fracture of T9 vertebra ([place]) 09/13/2019  . Chronic diastolic CHF (congestive heart failure) ([place]) 09/13/2019  . Overweight (BMI 25.0-29.9) 09/13/2019  . Constipation 09/13/2019  . Raynaud's phenomenon without gangrene 05/19/2018  . Iron deficiency anemia due to chronic blood loss 12/30/2017  . Insomnia 06/25/2017  . Mild CAD 05/24/2017  . PCP NOTES >>> 02/15/2015  . Superficial thrombophlebitis 04/06/2014  . Annual physical exam 08/18/2012  . Edema 04/14/2012  . Breast cancer ([place]) 11/05/2011  . Polycythemia 11/05/2011  . TIA (transient ischemic attack) 01/16/2011  . CARDIOMYOPATHY 08/03/2009  . DJD -- pain mgmt 08/16/2008  . ABDOMINAL PAIN, LEFT LOWER QUADRANT 07/05/2008  . GAIT DISTURBANCE 10/21/2007  . ANXIETY DEPRESSION 10/08/2007  . Hyperlipidemia 06/25/2007  . Takotsubo cardiomyopathy 06/25/2007  . GERD without esophagitis 06/25/2007  . Essential hypertension 09/30/2006  . Senile osteoporosis 09/30/2006  . Migraine with aura 09/30/2006   PCP:  Colon Branch, MD Pharmacy:   [facility] [phone] [address], [address] AT [facility] [address] [place] [address] Phone: [phone] Fax: [phone]     Social Determinants of Health ([place]) Interventions    Readmission Risk Interventions Readmission Risk Prevention Plan 09/14/2019  Medication Screening Complete  Transportation Screening Complete  Some recent data might be hidden

## 2019-11-10 NOTE — Anesthesia Preprocedure Evaluation (Addendum)
Anesthesia Evaluation  Patient identified by MRN, date of birth, ID band Patient awake    Reviewed: Allergy & Precautions, H&P , NPO status , Patient's Chart, lab work & pertinent test results, reviewed documented beta blocker date and time   Airway Mallampati: III  TM Distance: >3 FB Neck ROM: Full    Dental no notable dental hx. (+) Teeth Intact, Dental Advisory Given   Pulmonary sleep apnea , former smoker,    Pulmonary exam normal breath sounds clear to auscultation       Cardiovascular hypertension, Pt. on medications and Pt. on home beta blockers + CAD   Rhythm:Regular Rate:Normal     Neuro/Psych  Headaches, Anxiety Depression TIA   GI/Hepatic Neg liver ROS, hiatal hernia, GERD  Medicated,  Endo/Other  negative endocrine ROS  Renal/GU negative Renal ROS  negative genitourinary   Musculoskeletal  (+) Arthritis , Osteoarthritis,    Abdominal   Peds  Hematology  (+) Blood dyscrasia, anemia ,   Anesthesia Other Findings   Reproductive/Obstetrics negative OB ROS                            Anesthesia Physical Anesthesia Plan  ASA: III  Anesthesia Plan: General   Post-op Pain Management:    Induction: Intravenous  PONV Risk Score and Plan: 3 and Ondansetron and Dexamethasone  Airway Management Planned: Oral ETT  Additional Equipment:   Intra-op Plan:   Post-operative Plan: Extubation in OR  Informed Consent: I have reviewed the patients History and Physical, chart, labs and discussed the procedure including the risks, benefits and alternatives for the proposed anesthesia with the patient or authorized representative who has indicated his/her understanding and acceptance.     Dental advisory given  Plan Discussed with: CRNA  Anesthesia Plan Comments:         Anesthesia Quick Evaluation

## 2019-11-10 NOTE — Progress Notes (Signed)
PROGRESS NOTE  [name] SJG:[id number] DOB: July 03, 1929 DOA: 11/04/2019 PCP: Dawn Branch, MD   LOS: 6 days   Brief Narrative / Interim history: 64 female with history of polycythemia vera, breast cancer, CAD, CVA, diverticulitis, prior CVA, OSA who was admitted with a mechanical fall after tripping over a curb.  She was found to have Hydro on CT imaging, neurosurgery consulted and she is not a candidate for surgical intervention.  She also had seizure-like activity in the ED and was placed on Keppra.  She was initially admitted to the ICU, improved and transferred to the hospitalist service.  Imaging also showed new T4 compression fracture and she is scheduled to have kyphoplasty today  Subjective / 24h Interval events: She is complaining of pain into her back on my evaluation this morning.  Denies any chest pain, denies any shortness of breath.  No abdominal pain, nausea or vomiting.  Assessment & Plan: Principal Problem ICH complicated by seizure activity-secondary to mechanical fall, CT scan on admission showed small right-sided temporal lobe SDH as well as hemorrhagic contusion.  Neurosurgery consulted and there is no surgical intervention warranted at this time.  Due to seizure activity patient was placed on Keppra, they recommend follow-up 2 weeks after discharge for repeat CT scan of the brain.  Active Problems Acute metabolic encephalopathy-likely due to [name], seizure, neurology also consulted and Dr. Leonel Parrish evaluated patient in the ED.  She underwent an EEG which showed cortical dysfunction in the right temporal region secondary to underlying structural abnormality with mild to moderate diffuse encephalopathy, nonspecific, and without epileptiform discharges..  Continue Keppra, her encephalopathy seems to be improving and she is alert and appropriate this morning  Essential hypertension-continue metoprolol, blood pressure fairly well controlled this morning  New T4  compression fracture-patient was seen by neurosurgery, recommended kyphoplasty, there was an attempt to do kyphoplasty yesterday however patient could not tolerate due to pain and she is scheduled to have another attempt today with general anesthesia at this time  Anxiety-continue home medications  Scheduled Meds: . feeding supplement  1 Container Oral TID BM  . LORazepam  0.25 mg Oral BID  . mouth rinse  15 mL Mouth Rinse BID  . metoprolol tartrate  50 mg Oral BID  . ondansetron (ZOFRAN) IV  4 mg Intravenous Q6H   Continuous Infusions: . levETIRAcetam 500 mg (11/10/19 0927)  . vancomycin     PRN Meds:.alum & mag hydroxide-simeth, docusate sodium, fentaNYL (SUBLIMAZE) injection, hydrALAZINE, labetalol, morphine injection, ondansetron (ZOFRAN) IV, oxyCODONE-acetaminophen, polyethylene glycol  Diet Orders (From admission, onward)    Start     Ordered   11/10/19 0001  Diet NPO time specified Except for: Sips with Meds  Diet effective midnight       Question:  Except for  Answer:  Sips with Meds   11/09/19 1518          DVT prophylaxis: SCDs Start: 11/04/19 1757     Code Status: Full Code  Family Communication: no family at bedside   Status is: Inpatient  Remains inpatient appropriate because:Ongoing diagnostic testing needed not appropriate for outpatient work up, Unsafe d/c plan and Inpatient level of care appropriate due to severity of illness   Dispo: The patient is from: Home              Anticipated d/c is to: TBD, PT to see post kyphoplasty              Anticipated d/c date is:  3 days              Patient currently is not medically stable to d/c.  Consultants:  Neurosurgery Neurology Critical care Trauma surgery  Procedures:  None   Microbiology  None   Antimicrobials: None     Objective: Vitals:   11/10/19 0748 11/10/19 0820 11/10/19 0908 11/10/19 0923  BP: (!) 157/72 (!) 148/70 136/70 (!) 128/56  Pulse: 86   94  Resp: 16   18  Temp: 98.8 F (37.1  C)     TempSrc: Oral     SpO2: 100%     Weight:      Height:        Intake/Output Summary (Last 24 hours) at 11/10/2019 1044 Last data filed at 11/10/2019 4196 Gross per 24 hour  Intake 240 ml  Output 800 ml  Net -560 ml   Filed Weights   11/08/19 0500 11/09/19 0457 11/10/19 0348  Weight: 71.6 kg 70.3 kg 70.3 kg    Examination:  Constitutional: Appears uncomfortable due to pain Eyes: no scleral icterus ENMT: Mucous membranes are moist.  Neck: normal, supple Respiratory: clear to auscultation bilaterally, no wheezing, no crackles.  Shallow respirations Cardiovascular: Regular rate and rhythm, no murmurs / rubs / gallops. No LE edema. Abdomen: non distended, no tenderness. Bowel sounds positive.  Musculoskeletal: no clubbing / cyanosis.  Skin: no rashes Neurologic: No focal deficits, equal strength   Data Reviewed: I have independently reviewed following labs and imaging studies   CBC: Recent Labs  Lab 11/04/19 1700 11/05/19 0659 11/06/19 0145 11/08/19 0318  WBC 12.4* 12.2* 11.3* 8.1  NEUTROABS 10.9* 11.0* 10.1* 6.6  HGB 16.2* 15.1* 14.3 14.4  HCT 53.0* 49.0* 46.0 46.5*  MCV 96.5 95.3 95.2 96.5  PLT 424* 385 323 222   Basic Metabolic Panel: Recent Labs  Lab 11/05/19 0659 11/06/19 0145 11/08/19 0318 11/09/19 0236 11/10/19 0651  NA 136 135 134* 133* 134*  K 3.9 3.8 3.9 3.8 3.8  CL 100 98 95* 97* 95*  CO2 24 26 30 25 28   GLUCOSE 154* 153* 118* 109* 112*  BUN 9 12 15 16 21   CREATININE 0.68 0.64 0.50 0.54 0.57  CALCIUM 9.4 9.0 8.9 8.9 9.0  MG 2.2 2.2  --   --   --    Liver Function Tests: Recent Labs  Lab 11/04/19 1700 11/05/19 0659 11/06/19 0145  AST 60* 34 21  ALT 34 28 20  ALKPHOS 71 57 52  BILITOT 2.4* 3.0* 2.3*  PROT 7.3 6.4* 5.8*  ALBUMIN 4.3 3.6 3.2*   Coagulation Profile: Recent Labs  Lab 11/04/19 1700  INR 1.1   HbA1C: No results for input(s): HGBA1C in the last 72 hours. CBG: Recent Labs  Lab 11/05/19 0054 11/05/19 1918    GLUCAP 154* 101*    Recent Results (from the past 240 hour(s))  SARS Coronavirus 2 by RT PCR (hospital order, performed in [facility] hospital lab) Nasopharyngeal Nasopharyngeal Swab     Status: None   Collection Time: 11/04/19  5:11 PM   Specimen: Nasopharyngeal Swab  Result Value Ref Range Status   SARS Coronavirus 2 NEGATIVE NEGATIVE Final    Comment: (NOTE) SARS-CoV-2 target nucleic acids are NOT DETECTED.  The SARS-CoV-2 RNA is generally detectable in upper and lower respiratory specimens during the acute phase of infection. The lowest concentration of SARS-CoV-2 viral copies this assay can detect is 250 copies / mL. A negative result does not preclude SARS-CoV-2 infection and should not  be used as the sole basis for treatment or other patient management decisions.  A negative result may occur with improper specimen collection / handling, submission of specimen other than nasopharyngeal swab, presence of viral mutation(s) within the areas targeted by this assay, and inadequate number of viral copies (<250 copies / mL). A negative result must be combined with clinical observations, patient history, and epidemiological information.  Fact Sheet for Patients:   StrictlyIdeas.no  Fact Sheet for Healthcare Providers: BankingDealers.co.za  This test is not yet approved or  cleared by the [place] FDA and has been authorized for detection and/or diagnosis of SARS-CoV-2 by FDA under an Emergency Use Authorization (EUA).  This EUA will remain in effect (meaning this test can be used) for the duration of the COVID-19 declaration under Section 564(b)(1) of the Act, 21 U.S.C. section 360bbb-3(b)(1), unless the authorization is terminated or revoked sooner.  Performed at [facility], [place] [address] [place], [place] [address]   MRSA PCR Screening     Status: None   Collection Time: 11/04/19 11:48 PM    Specimen: Nasal Mucosa; Nasopharyngeal  Result Value Ref Range Status   MRSA by PCR NEGATIVE NEGATIVE Final    Comment:        The GeneXpert MRSA Assay (FDA approved for NASAL specimens only), is one component of a comprehensive MRSA colonization surveillance program. It is not intended to diagnose MRSA infection nor to guide or monitor treatment for MRSA infections. Performed at [facility], [place] [address] [place], [place] [address]      Radiology Studies: No results found.   Marzetta Board, MD, PhD [facility]  Between 7 am - 7 pm I am available, please contact me via Amion or Securechat  Between 7 pm - 7 am I am not available, please contact night coverage MD/APP via Amion

## 2019-11-10 NOTE — Progress Notes (Signed)
Pt has been kept NPO since midnight, understood the reasons. Still c/o generalized pain and PRN given as per MD order

## 2019-11-10 NOTE — Progress Notes (Signed)
This chaplain responded to referral for spiritual care from Pt. CM-Kelly about completing Pt. AD.  The chaplain phoned the Pt. daughter-[name] for coordination of a family presence with the Pt.  The chaplain understands the Pt. nephew-Philip Hill will arrive on Wednesday mid-day. The RN will page spiritual care when Arnette Norris arrives.  The chaplain communicated with Early Chars the Pt. is the only person that needs to understand and  be present when the document is completed.  Early Chars thanked the chaplain for the phone call.

## 2019-11-10 NOTE — Transfer of Care (Signed)
Immediate Anesthesia Transfer of Care Note  Patient: [name]  Procedure(s) Performed: VERTEBROPLASTY (N/A )  Patient Location: PACU  Anesthesia Type:General  Level of Consciousness: awake, alert  and oriented  Airway & Oxygen Therapy: Patient Spontanous Breathing and Patient connected to nasal cannula oxygen  Post-op Assessment: Report given to RN and Post -op Vital signs reviewed and stable  Post vital signs: Reviewed and stable  Last Vitals:  Vitals Value Taken Time  BP    Temp    Pulse 82 11/10/19 1401  Resp    SpO2 92 % 11/10/19 1401  Vitals shown include unvalidated device data. BP 139/59 Last Pain:  Vitals:   11/10/19 1110  TempSrc: Oral  PainSc:       Patients Stated Pain Goal: 2 (58/25/18 9842)  Complications: No complications documented.

## 2019-11-10 NOTE — NC FL2 (Signed)
[place] LEVEL OF CARE SCREENING TOOL     IDENTIFICATION  Patient Name: [name] Birthdate: October 30, 1929 Sex: female Admission Date (Current Location): 11/04/2019  [facility] and [place] Number:  Herbalist and Address:  [facility], [place] [address] [place], [place] [address]      Provider Number: [id number]  Attending Physician Name and Address:  Caren Griffins, MD  Relative Name and Phone Number:       Current Level of Care: Hospital Recommended Level of Care: [place] Prior Approval Number:    Date Approved/Denied:   PASRR Number: [id number]  Discharge Plan: SNF    Current Diagnoses: Patient Active Problem List   Diagnosis Date Noted  . ICH (intracerebral hemorrhage) ([place]) 11/04/2019  . Intractable back pain 09/13/2019  . Compression fracture of T9 vertebra ([place]) 09/13/2019  . Chronic diastolic CHF (congestive heart failure) ([place]) 09/13/2019  . Overweight (BMI 25.0-29.9) 09/13/2019  . Constipation 09/13/2019  . Raynaud's phenomenon without gangrene 05/19/2018  . Iron deficiency anemia due to chronic blood loss 12/30/2017  . Insomnia 06/25/2017  . Mild CAD 05/24/2017  . PCP NOTES >>> 02/15/2015  . Superficial thrombophlebitis 04/06/2014  . Annual physical exam 08/18/2012  . Edema 04/14/2012  . Breast cancer ([place]) 11/05/2011  . Polycythemia 11/05/2011  . TIA (transient ischemic attack) 01/16/2011  . CARDIOMYOPATHY 08/03/2009  . DJD -- pain mgmt 08/16/2008  . ABDOMINAL PAIN, LEFT LOWER QUADRANT 07/05/2008  . GAIT DISTURBANCE 10/21/2007  . ANXIETY DEPRESSION 10/08/2007  . Hyperlipidemia 06/25/2007  . Takotsubo cardiomyopathy 06/25/2007  . GERD without esophagitis 06/25/2007  . Essential hypertension 09/30/2006  . Senile osteoporosis 09/30/2006  . Migraine with aura 09/30/2006    Orientation RESPIRATION BLADDER Height & Weight     Self, Time, Situation, Place  O2 (see d/c  summary for oxygen requirements) Continent Weight: 70.3 kg Height:  5' 5.98" (167.6 cm)  BEHAVIORAL SYMPTOMS/MOOD NEUROLOGICAL BOWEL NUTRITION STATUS      Continent Diet (Regular diet--thin liquids)  AMBULATORY STATUS COMMUNICATION OF NEEDS Skin   Extensive Assist Verbally Surgical wounds (Upper back dressing--s/p Kyphoplasty)                       Personal Care Assistance Level of Assistance  Bathing, Dressing, Feeding Bathing Assistance: Maximum assistance Feeding assistance: Limited assistance Dressing Assistance: Maximum assistance     Functional Limitations Info  Sight, Hearing, Speech Sight Info: Impaired Hearing Info: Adequate Speech Info: Adequate    SPECIAL CARE FACTORS FREQUENCY  PT (By licensed PT), OT (By licensed OT)     PT Frequency: 5x/wk OT Frequency: 5x/wk            Contractures Contractures Info: Not present    Additional Factors Info  Code Status, Allergies, Psychotropic Code Status Info: Full Allergies Info: Penicillins, Valsartan, Atorvastatin, Carvedilol, Ezetimibe, Fluvastatin Sodium, Magnesium Hydroxide, Meloxicam, Pneumovax, Quinapril Hcl, Simvastatin, Topamax, Vit D-vit E-safflower Oil Psychotropic Info: Ativan 0.25 mg BID         Current Medications (11/10/2019):  This is the current hospital active medication list Current Facility-Administered Medications  Medication Dose Route Frequency Provider Last Rate Last Admin  . 0.9 %  sodium chloride infusion   Intravenous Continuous Deveshwar, Sanjeev, MD      . alum & mag hydroxide-simeth (MAALOX/MYLANTA) 200-200-20 MG/5ML suspension 30 mL  30 mL Oral Q6H PRN Olalere, Adewale A, MD   30 mL at 11/09/19 1551  . bupivacaine (MARCAINE)  0.5 % injection           . bupivacaine (MARCAINE) 0.5 % injection   Infiltration PRN Luanne Bras, MD   10 mL at 11/10/19 1340  . docusate sodium (COLACE) capsule 100 mg  100 mg Oral BID PRN Mannam, Praveen, MD      . feeding supplement (BOOST / RESOURCE  BREEZE) liquid 1 Container  1 Container Oral TID BM Spongberg, Audie Pinto, MD   1 Container at 11/08/19 1554  . fentaNYL (SUBLIMAZE) injection 50 mcg  50 mcg Intravenous Q2H PRN Olalere, Adewale A, MD   50 mcg at 11/05/19 1619  . gelatin adsorbable (GELFOAM/SURGIFOAM) 12-7 MM sponge 12-7 mm           . hydrALAZINE (APRESOLINE) injection 10-40 mg  10-40 mg Intravenous Q4H PRN Frederik Pear, MD   20 mg at 11/10/19 0820  . labetalol (NORMODYNE) injection 10-20 mg  10-20 mg Intravenous Q2H PRN Frederik Pear, MD   20 mg at 11/08/19 2038  . lactated ringers infusion   Intravenous Continuous Roderic Palau, MD 10 mL/hr at 11/10/19 1202 Restarted at 11/10/19 1216  . levETIRAcetam (KEPPRA) IVPB 500 mg/100 mL premix  500 mg Intravenous Q12H Mannam, Praveen, MD 400 mL/hr at 11/10/19 0927 500 mg at 11/10/19 0927  . LORazepam (ATIVAN) tablet 0.25 mg  0.25 mg Oral BID Olalere, Adewale A, MD   0.25 mg at 11/10/19 0923  . MEDLINE mouth rinse  15 mL Mouth Rinse BID Mannam, Praveen, MD   15 mL at 11/10/19 0935  . metoprolol tartrate (LOPRESSOR) tablet 50 mg  50 mg Oral BID Olalere, Adewale A, MD   50 mg at 11/10/19 0923  . morphine 2 MG/ML injection 1 mg  1 mg Intravenous Q2H PRN Marcell Anger, MD   1 mg at 11/10/19 0924  . ondansetron (ZOFRAN) injection 4 mg  4 mg Intravenous Q6H Olalere, Adewale A, MD   4 mg at 11/10/19 0543  . ondansetron (ZOFRAN) injection 4 mg  4 mg Intravenous Q6H PRN Spongberg, Audie Pinto, MD      . oxyCODONE-acetaminophen (PERCOCET/ROXICET) 5-325 MG per tablet 1 tablet  1 tablet Oral Q4H PRN Eustace Moore, MD   1 tablet at 11/09/19 2119  . polyethylene glycol (MIRALAX / GLYCOLAX) packet 17 g  17 g Oral Daily PRN Mannam, Praveen, MD   17 g at 11/09/19 2120  . tobramycin (NEBCIN) 1.2 g powder              Discharge Medications: Please see discharge summary for a list of discharge medications.  Relevant Imaging Results:  Relevant Lab Results:   Additional  Information SS#: [id number]  Pollie Friar, RN

## 2019-11-10 NOTE — Anesthesia Procedure Notes (Signed)
Procedure Name: Intubation Date/Time: 11/10/2019 12:28 PM Performed by: Wilburn Cornelia, CRNA Pre-anesthesia Checklist: Patient identified, Emergency Drugs available, Suction available and Patient being monitored Patient Re-evaluated:Patient Re-evaluated prior to induction Oxygen Delivery Method: Circle System Utilized Preoxygenation: Pre-oxygenation with 100% oxygen Induction Type: IV induction Ventilation: Mask ventilation without difficulty Laryngoscope Size: Miller and 2 Grade View: Grade I Tube type: Oral Tube size: 7.0 mm Number of attempts: 1 Airway Equipment and Method: Stylet Placement Confirmation: ETT inserted through vocal cords under direct vision,  positive ETCO2 and breath sounds checked- equal and bilateral Secured at: 22 cm Tube secured with: Tape Dental Injury: Teeth and Oropharynx as per pre-operative assessment

## 2019-11-10 NOTE — Procedures (Signed)
S/P T4 VP under GA. Patient extubated. Maintaining O2 sats. Gradually waking up.   Opens eyes to command. Moving all 4s spontaneously . S.Muzammil Bruins MD

## 2019-11-11 ENCOUNTER — Telehealth: Payer: Self-pay | Admitting: Internal Medicine

## 2019-11-11 ENCOUNTER — Inpatient Hospital Stay (HOSPITAL_COMMUNITY): Payer: Medicare Other

## 2019-11-11 ENCOUNTER — Encounter (HOSPITAL_COMMUNITY): Payer: Self-pay | Admitting: Interventional Radiology

## 2019-11-11 LAB — COMPREHENSIVE METABOLIC PANEL
ALT: 11 U/L (ref 0–44)
AST: 20 U/L (ref 15–41)
Albumin: 2.7 g/dL — ABNORMAL LOW (ref 3.5–5.0)
Alkaline Phosphatase: 60 U/L (ref 38–126)
Anion gap: 10 (ref 5–15)
BUN: 24 mg/dL — ABNORMAL HIGH (ref 8–23)
CO2: 27 mmol/L (ref 22–32)
Calcium: 9.3 mg/dL (ref 8.9–10.3)
Chloride: 96 mmol/L — ABNORMAL LOW (ref 98–111)
Creatinine, Ser: 0.45 mg/dL (ref 0.44–1.00)
GFR calc Af Amer: >60 mL/min (ref >60)
GFR calc non Af Amer: >60 mL/min (ref >60)
Glucose, Bld: 132 mg/dL — ABNORMAL HIGH (ref 70–99)
Potassium: 5.2 mmol/L — ABNORMAL HIGH (ref 3.5–5.1)
Sodium: 133 mmol/L — ABNORMAL LOW (ref 135–145)
Total Bilirubin: 1.6 mg/dL — ABNORMAL HIGH (ref 0.3–1.2)
Total Protein: 5.7 g/dL — ABNORMAL LOW (ref 6.5–8.1)

## 2019-11-11 LAB — CBC
HCT: 46.9 % — ABNORMAL HIGH (ref 36.0–46.0)
Hemoglobin: 15.4 g/dL — ABNORMAL HIGH (ref 12.0–15.0)
MCH: 30.7 pg (ref 26.0–34.0)
MCHC: 32.8 g/dL (ref 30.0–36.0)
MCV: 93.4 fL (ref 80.0–100.0)
Platelets: 391 10*3/uL (ref 150–400)
RBC: 5.02 MIL/uL (ref 3.87–5.11)
RDW: 18.6 % — ABNORMAL HIGH (ref 11.5–15.5)
WBC: 10.2 10*3/uL (ref 4.0–10.5)
nRBC: 0 % (ref 0.0–0.2)

## 2019-11-11 LAB — MAGNESIUM: Magnesium: 2.2 mg/dL (ref 1.7–2.4)

## 2019-11-11 LAB — SARS CORONAVIRUS 2 (TAT 6-24 HRS): SARS Coronavirus 2: NEGATIVE

## 2019-11-11 MED ORDER — TIZANIDINE HCL 2 MG PO TABS
2.0000 mg | ORAL_TABLET | Freq: Three times a day (TID) | ORAL | Status: DC | PRN
  Filled 2019-11-11: qty 1

## 2019-11-11 MED ORDER — SODIUM ZIRCONIUM CYCLOSILICATE 10 G PO PACK
10.0000 g | PACK | Freq: Once | ORAL | Status: AC
  Administered 2019-11-11: 10 g via ORAL
  Filled 2019-11-11: qty 1

## 2019-11-11 MED ORDER — ACETAMINOPHEN 500 MG PO TABS
1000.0000 mg | ORAL_TABLET | Freq: Three times a day (TID) | ORAL | Status: DC
  Administered 2019-11-11 – 2019-11-12 (×3): 1000 mg via ORAL
  Filled 2019-11-11 (×3): qty 2

## 2019-11-11 MED ORDER — BISACODYL 10 MG RE SUPP: 10.0000 mg | Freq: Every day | RECTAL | Status: DC | PRN

## 2019-11-11 MED ORDER — DOCUSATE SODIUM 100 MG PO CAPS
100.0000 mg | ORAL_CAPSULE | Freq: Two times a day (BID) | ORAL | Status: DC
  Administered 2019-11-11 (×2): 100 mg via ORAL
  Filled 2019-11-11 (×2): qty 1

## 2019-11-11 MED ORDER — HYDROXYUREA 500 MG PO CAPS
500.0000 mg | ORAL_CAPSULE | ORAL | Status: DC
  Administered 2019-11-11: 500 mg via ORAL
  Filled 2019-11-11: qty 1

## 2019-11-11 MED ORDER — LIDOCAINE 5 % EX PTCH
1.0000 | MEDICATED_PATCH | CUTANEOUS | Status: DC
  Administered 2019-11-11 – 2019-11-12 (×2): 1 via TRANSDERMAL
  Filled 2019-11-11 (×2): qty 1

## 2019-11-11 MED ORDER — ASPIRIN 81 MG PO CHEW
81.0000 mg | CHEWABLE_TABLET | Freq: Every day | ORAL | Status: DC
  Administered 2019-11-11 – 2019-11-12 (×2): 81 mg via ORAL
  Filled 2019-11-11 (×2): qty 1

## 2019-11-11 MED ORDER — POLYETHYLENE GLYCOL 3350 17 G PO PACK
17.0000 g | PACK | Freq: Every day | ORAL | Status: DC
  Administered 2019-11-11: 17 g via ORAL
  Filled 2019-11-11: qty 1

## 2019-11-11 MED ORDER — PANTOPRAZOLE SODIUM 40 MG PO TBEC
40.0000 mg | DELAYED_RELEASE_TABLET | Freq: Every day | ORAL | Status: DC
  Administered 2019-11-11 – 2019-11-12 (×2): 40 mg via ORAL
  Filled 2019-11-11 (×2): qty 1

## 2019-11-11 NOTE — Progress Notes (Signed)
[facility]                                                                              Patient Demographics  [name], is a 86 y.o. female, DOB - 1929-12-19, HAL:[id number]  Admit date - 11/04/2019   Admitting Physician Marshell Garfinkel, MD  Outpatient Primary MD for the patient is Colon Branch, MD  Outpatient specialists:   LOS - 7  days   Medical records reviewed and are as summarized below:    No chief complaint on file.      Brief summary   86 year old with multiple medical issues including Polycythemia vera, breast cancer, CAD, CVA, diverticulitis, GERD, CVA, OSA. Admitted with mechanical fall after tripping over curb.. Found to have [place], [place] on CT imaging. She is not a candidate for surgical intervention. She had a seizure activity in ED with 1 min of full tonic-clonic activity and given ativan and loaded with keppra. PCCM consulted for admission   Assessment & Plan    Principal problem   ICH (intracerebral hemorrhage) ([place]) -Secondary to mechanical fall.    - CT imaging showed small right-sided temporal lobe SDH as well as hemorrhagic contusion.  -Seen by neurosurgery, surgical intervention is not warranted at this time -Neurosurgery recommended Keppra, follow-up 2 weeks after discharge for repeat CT of the brain   Acute metabolic encephalopathy, seizure activity -Patient had seizure activity in ED with 1 minute of full tonic-clonic activity and was given Ativan and loaded with Keppra.   -Seen by neurology, Dr Leonel Ramsay recommended EEG, Keppra -EEG showed cortical dysfunction in the right temporal region secondary to underlying structural abnormality, mild to moderate diffuse encephalopathy, nonspecific, no definitive epileptiform discharges. -Neurology recommended continue Keppra 500 mg twice daily for 1 month, outpatient neurology follow-up in 4 to 6 weeks -Currently alert and awake, oriented  Left-sided shoulder and chest pain -Patient  complaining of left-sided chest pain under the breast, tender to palpation, shoulder pain -Ordered left-sided rib series, shoulder x-ray -Continue pain control  Essential hypertension  -BP currently stable, likely elevated due to pain  New T4 compression fracture -Patient was seen by neurosurgery, did not recommend TLSO with cervical extension, recommended kyphoplasty.  Seen by trauma surgery as well -Underwent kyphoplasty on 11/10/2019, back pain under control -Start PT today   Anxiety Continue lorazepam   Polycythemia vera, stage I ca of left breast s/p lumpectomy in 2013 Followed by oncology with regular phlebotomy and IV iron Resume hydroxyurea   Code Status: full DVT Prophylaxis:  SCD's Family Communication: Discussed all imaging results, lab results, explained to the patient's grandson, Mr Lura Em   Disposition Plan:     Status is: Inpatient  Remains inpatient appropriate because:Inpatient level of care appropriate due to severity of illness   Dispo: The patient is from: Home              Anticipated d/c is to: TBD               Anticipated d/c date is: 2 days              Patient currently is not medically  stable to d/c.  PT evaluation pending for disposition planning   Time Spent in minutes 35 minutes  Procedures:  Kyphoplasty 6/29  Consultants:   Neurosurgery Neurology CCM Trauma surgery  Antimicrobials:   Anti-infectives (From admission, onward)   Start     Dose/Rate Route Frequency Ordered Stop   11/10/19 1213  tobramycin (NEBCIN) 1.2 g powder       Note to Pharmacy: Burnard Leigh   : cabinet override      11/10/19 1213 11/11/19 0029   11/09/19 1412  tobramycin (NEBCIN) 1.2 g powder  Status:  Discontinued       Note to Pharmacy: Emeline General   : cabinet override      11/09/19 1412 11/09/19 1505   11/09/19 1330  vancomycin (VANCOCIN) IVPB 1000 mg/200 mL premix        1,000 mg 200 mL/hr over 60 Minutes Intravenous  Once 11/09/19 1324  11/10/19 1335         Medications  Scheduled Meds: . feeding supplement  1 Container Oral TID BM  . LORazepam  0.25 mg Oral BID  . mouth rinse  15 mL Mouth Rinse BID  . metoprolol tartrate  50 mg Oral BID  . ondansetron (ZOFRAN) IV  4 mg Intravenous Q6H   Continuous Infusions: . lactated ringers 10 mL/hr at 11/10/19 1202  . levETIRAcetam 500 mg (11/11/19 1000)   PRN Meds:.alum & mag hydroxide-simeth, bupivacaine, docusate sodium, fentaNYL (SUBLIMAZE) injection, hydrALAZINE, labetalol, morphine injection, ondansetron (ZOFRAN) IV, oxyCODONE-acetaminophen, polyethylene glycol      Subjective:   Magdalena Skilton was seen and examined today.  Back pain improving however complaining of left shoulder pain, left-sided reproducible chest pain.  Patient denies dizziness, shortness of breath,  N/V, new weakness, numbess, tingling.  No fevers, no acute events overnight  Objective:   Vitals:   11/11/19 0550 11/11/19 0820 11/11/19 1222 11/11/19 1317  BP: 140/60 136/64 (!) 171/81 (!) 119/58  Pulse:  88  72  Resp:  20    Temp:  98 F (36.7 C) 97.9 F (36.6 C)   TempSrc:  Oral Oral   SpO2:  99% 100%   Weight:      Height:        Intake/Output Summary (Last 24 hours) at 11/11/2019 1422 Last data filed at 11/11/2019 1100 Gross per 24 hour  Intake 320 ml  Output 700 ml  Net -380 ml     Wt Readings from Last 3 Encounters:  11/11/19 70.8 kg  10/29/19 67.6 kg  10/23/19 68.5 kg    Physical Exam  General: Alert and oriented x 3, NAD  Cardiovascular: S1 S2 clear, RRR. No pedal edema b/l  Respiratory: CTAB, no wheezing, rales or rhonchi, left-sided tender to touch  Gastrointestinal: Soft, nontender, nondistended, NBS  Ext: no pedal edema bilaterally  Neuro: no new deficits  Musculoskeletal: No cyanosis, clubbing  Skin: No rashes  Psych: Anxious  Data Reviewed:  I have personally reviewed following labs and imaging studies  Micro Results Recent Results (from the  past 240 hour(s))  SARS Coronavirus 2 by RT PCR (hospital order, performed in [facility] hospital lab) Nasopharyngeal Nasopharyngeal Swab     Status: None   Collection Time: 11/04/19  5:11 PM   Specimen: Nasopharyngeal Swab  Result Value Ref Range Status   SARS Coronavirus 2 NEGATIVE NEGATIVE Final    Comment: (NOTE) SARS-CoV-2 target nucleic acids are NOT DETECTED.  The SARS-CoV-2 RNA is generally detectable in upper and lower respiratory specimens during  the acute phase of infection. The lowest concentration of SARS-CoV-2 viral copies this assay can detect is 250 copies / mL. A negative result does not preclude SARS-CoV-2 infection and should not be used as the sole basis for treatment or other patient management decisions.  A negative result may occur with improper specimen collection / handling, submission of specimen other than nasopharyngeal swab, presence of viral mutation(s) within the areas targeted by this assay, and inadequate number of viral copies (<250 copies / mL). A negative result must be combined with clinical observations, patient history, and epidemiological information.  Fact Sheet for Patients:   StrictlyIdeas.no  Fact Sheet for Healthcare Providers: BankingDealers.co.za  This test is not yet approved or  cleared by the [place] FDA and has been authorized for detection and/or diagnosis of SARS-CoV-2 by FDA under an Emergency Use Authorization (EUA).  This EUA will remain in effect (meaning this test can be used) for the duration of the COVID-19 declaration under Section 564(b)(1) of the Act, 21 U.S.C. section 360bbb-3(b)(1), unless the authorization is terminated or revoked sooner.  Performed at [facility], [place] [address] [place], [place] [address]   MRSA PCR Screening     Status: None   Collection Time: 11/04/19 11:48 PM   Specimen: Nasal Mucosa; Nasopharyngeal  Result Value Ref  Range Status   MRSA by PCR NEGATIVE NEGATIVE Final    Comment:        The GeneXpert MRSA Assay (FDA approved for NASAL specimens only), is one component of a comprehensive MRSA colonization surveillance program. It is not intended to diagnose MRSA infection nor to guide or monitor treatment for MRSA infections. Performed at [facility], [place] [address], [place], [place] [address]     Radiology Reports DG Chest 2 View  Result Date: 11/04/2019 CLINICAL DATA:  Fall. EXAM: CHEST - 2 VIEW COMPARISON:  June 24, 2018. FINDINGS: Stable cardiomediastinal silhouette. Hiatal hernia is noted. No pneumothorax or pleural effusion is noted. Both lungs are clear. The visualized skeletal structures are unremarkable. IMPRESSION: No active cardiopulmonary disease. Hiatal hernia. Electronically Signed   By: Marijo Conception M.D.   On: 11/04/2019 13:20   DG Pelvis 1-2 Views  Result Date: 11/04/2019 CLINICAL DATA:  Fall EXAM: PELVIS - 1-2 VIEW COMPARISON:  None. FINDINGS: Osteopenia. There is no evidence of displaced pelvic fracture or diastasis. No pelvic bone lesions are seen. Possible bladder calculus or calcified uterine fibroid in the low left hemipelvis. IMPRESSION: Osteopenia. No acute displaced fracture or dislocation of the pelvis. Please note that plain radiographs are insensitive for hip and pelvic fracture, particularly in the setting of osteopenia. Consider CT or MRI to more sensitively evaluate for fracture if there is high clinical suspicion. Electronically Signed   By: Eddie Candle M.D.   On: 11/04/2019 13:22   CT HEAD WO CONTRAST  Result Date: 11/05/2019 CLINICAL DATA:  Intracranial hemorrhage.  Subdural hemorrhage. EXAM: CT HEAD WITHOUT CONTRAST TECHNIQUE: Contiguous axial images were obtained from the base of the skull through the vertex without intravenous contrast. COMPARISON:  CT head without contrast 11/04/2019. FINDINGS: Brain: Hemorrhagic contusions involving the inferolateral  right temporal lobe and anterior inferior right frontal lobe demonstrate expected evolution and are more evident on today's exam. Hyperdense components are more prominent with some surrounding edema. Subarachnoid hemorrhage is evident at both locations. Subdural blood adjacent to the temporal lobe is stable. Anterior subdural hemorrhage is more visible on today's study extending to anterior falx. No new  areas of hemorrhage are present. Moderate atrophy and white matter disease are again seen. The ventricles are of normal size. Vascular: Atherosclerotic calcifications are present within the cavernous internal carotid arteries bilaterally of the right vertebral artery. No hyperdense vessel is present. Skull: Calvarium is intact. Nondisplaced left occipital skull fracture is again seen. Fracture extends to the foramen magnum. No additional fractures are present. Skin staples are present over the left occipital scalp laceration. Sinuses/Orbits: The paranasal sinuses and mastoid air cells are clear. The globes and orbits are within normal limits. IMPRESSION: 1. Expected evolution of hemorrhagic contusions involving the inferolateral right temporal lobe and anterior inferior right frontal lobe. 2. Subarachnoid hemorrhage at both locations. 3. Stable subdural blood adjacent to the right temporal lobe. 4. Anterior subdural hemorrhage is more visible on today's study extending to the anterior falx. 5. Stable nondisplaced left occipital skull fracture. 6. Stable atrophy and white matter disease. Electronically Signed   By: San Morelle M.D.   On: 11/05/2019 05:53   CT Head Wo Contrast  Result Date: 11/04/2019 CLINICAL DATA:  Head trauma, headache. Poly trauma, critical, head/cervical spine injury suspected. Additional history provided: Mechanical fall landing on back, laceration to back of head. EXAM: CT HEAD WITHOUT CONTRAST CT CERVICAL SPINE WITHOUT CONTRAST TECHNIQUE: Multidetector CT imaging of the head and  cervical spine was performed following the standard protocol without intravenous contrast. Multiplanar CT image reconstructions of the cervical spine were also generated. COMPARISON:  Brain MRI 07/19/2018, head CT 07/01/2017, cervical spine MRI 05/04/2005. FINDINGS: CT HEAD FINDINGS Brain: There is acute extra-axial hemorrhage along the inferolateral right temporal lobe measuring up to 11 mm in thickness (for instance as seen on series 5, image 33 and series 3, image 8). Additionally, there is subtle hemorrhagic parenchymal contusion within the anterior right temporal lobe (series 3, image 5) (series 5, image 21). Trace acute subdural hemorrhage is also questioned along the right tentorium. Hemorrhagic parenchymal contusions within the anteroinferior right frontal lobe with overlying small volume acute subarachnoid hemorrhage (greater on the right). Stable moderate chronic small vessel ischemic changes within the cerebral white matter. Redemonstrated chronic right thalamic lacunar infarct. Stable mild generalized parenchymal atrophy. No evidence of intracranial mass. There is no hydrocephalus or midline shift. Vascular: No hyperdense vessel.  Atherosclerotic calcifications. Skull: There is a nondisplaced acute fracture within the left occipital calvarium extending inferiorly to the level of the foramen magnum. Sinuses/Orbits: Visualized orbits show no acute finding. No significant paranasal sinus disease or mastoid effusion at the imaged levels. CT CERVICAL SPINE FINDINGS Alignment: Straightening of the expected cervical lordosis. Trace C4-C5 grade 1 anterolisthesis. There is leftward rotation of C1 upon C2, which may be related to patient head positioning at the time of examination. Skull base and vertebrae: The basion-dental and atlanto-dental intervals are maintained.No evidence of acute fracture to the cervical spine. Congenital nonunion of the posterior arch of C1. Soft tissues and spinal canal: No prevertebral  fluid or swelling. No visible canal hematoma. Disc levels: Mild for age cervical spondylosis. No high-grade bony spinal canal narrowing. Upper chest: No consolidation within the imaged lung apices. No visible pneumothorax. These results were called by telephone at the time of interpretation on 11/04/2019 at 3:51 pm to provider [facility] , who verbally acknowledged these results. IMPRESSION: CT head: 1. Hemorrhagic parenchymal contusions within the anteroinferior right frontal lobe with overlying small volume acute subarachnoid hemorrhage. 2. Subtle hemorrhagic parenchymal contusion is also present within the anterior right temporal lobe. 3. Acute extra-axial hemorrhage overlying  the inferolateral right temporal lobe measuring up to 11 mm in thickness. Additionally, there is suspected trace acute subdural hemorrhage along the right tentorium. 4. Acute nondisplaced fracture of the left occipital calvarium extending inferiorly to the level of the foramen magnum. 5. Unchanged mild generalized parenchymal atrophy with moderate chronic small vessel ischemic disease. Redemonstrated chronic right thalamic lacunar infarct. CT cervical spine: 1. No evidence of acute fracture to the cervical spine. 2. Leftward rotation of C1 upon C2, which may be related to patient head positioning at the time of examination. Clinical correlation is recommended. 3. Mild for age cervical spondylosis. 4. Mild C4-C5 grade 1 anterolisthesis. Electronically Signed   By: Kellie Simmering DO   On: 11/04/2019 15:52   CT Cervical Spine Wo Contrast  Result Date: 11/04/2019 CLINICAL DATA:  Head trauma, headache. Poly trauma, critical, head/cervical spine injury suspected. Additional history provided: Mechanical fall landing on back, laceration to back of head. EXAM: CT HEAD WITHOUT CONTRAST CT CERVICAL SPINE WITHOUT CONTRAST TECHNIQUE: Multidetector CT imaging of the head and cervical spine was performed following the standard protocol without  intravenous contrast. Multiplanar CT image reconstructions of the cervical spine were also generated. COMPARISON:  Brain MRI 07/19/2018, head CT 07/01/2017, cervical spine MRI 05/04/2005. FINDINGS: CT HEAD FINDINGS Brain: There is acute extra-axial hemorrhage along the inferolateral right temporal lobe measuring up to 11 mm in thickness (for instance as seen on series 5, image 33 and series 3, image 8). Additionally, there is subtle hemorrhagic parenchymal contusion within the anterior right temporal lobe (series 3, image 5) (series 5, image 21). Trace acute subdural hemorrhage is also questioned along the right tentorium. Hemorrhagic parenchymal contusions within the anteroinferior right frontal lobe with overlying small volume acute subarachnoid hemorrhage (greater on the right). Stable moderate chronic small vessel ischemic changes within the cerebral white matter. Redemonstrated chronic right thalamic lacunar infarct. Stable mild generalized parenchymal atrophy. No evidence of intracranial mass. There is no hydrocephalus or midline shift. Vascular: No hyperdense vessel.  Atherosclerotic calcifications. Skull: There is a nondisplaced acute fracture within the left occipital calvarium extending inferiorly to the level of the foramen magnum. Sinuses/Orbits: Visualized orbits show no acute finding. No significant paranasal sinus disease or mastoid effusion at the imaged levels. CT CERVICAL SPINE FINDINGS Alignment: Straightening of the expected cervical lordosis. Trace C4-C5 grade 1 anterolisthesis. There is leftward rotation of C1 upon C2, which may be related to patient head positioning at the time of examination. Skull base and vertebrae: The basion-dental and atlanto-dental intervals are maintained.No evidence of acute fracture to the cervical spine. Congenital nonunion of the posterior arch of C1. Soft tissues and spinal canal: No prevertebral fluid or swelling. No visible canal hematoma. Disc levels: Mild for  age cervical spondylosis. No high-grade bony spinal canal narrowing. Upper chest: No consolidation within the imaged lung apices. No visible pneumothorax. These results were called by telephone at the time of interpretation on 11/04/2019 at 3:51 pm to provider [facility] , who verbally acknowledged these results. IMPRESSION: CT head: 1. Hemorrhagic parenchymal contusions within the anteroinferior right frontal lobe with overlying small volume acute subarachnoid hemorrhage. 2. Subtle hemorrhagic parenchymal contusion is also present within the anterior right temporal lobe. 3. Acute extra-axial hemorrhage overlying the inferolateral right temporal lobe measuring up to 11 mm in thickness. Additionally, there is suspected trace acute subdural hemorrhage along the right tentorium. 4. Acute nondisplaced fracture of the left occipital calvarium extending inferiorly to the level of the foramen magnum. 5. Unchanged mild generalized  parenchymal atrophy with moderate chronic small vessel ischemic disease. Redemonstrated chronic right thalamic lacunar infarct. CT cervical spine: 1. No evidence of acute fracture to the cervical spine. 2. Leftward rotation of C1 upon C2, which may be related to patient head positioning at the time of examination. Clinical correlation is recommended. 3. Mild for age cervical spondylosis. 4. Mild C4-C5 grade 1 anterolisthesis. Electronically Signed   By: Kellie Simmering DO   On: 11/04/2019 15:52   CT THORACIC SPINE WO CONTRAST  Result Date: 11/05/2019 CLINICAL DATA:  Status post fall. History of prior compression fractures and vertebral augmentation. EXAM: CT THORACIC SPINE WITHOUT CONTRAST TECHNIQUE: Multidetector CT images of the thoracic were obtained using the standard protocol without intravenous contrast. COMPARISON:  MRI thoracic spine 09/14/2019. FINDINGS: Alignment: Maintained. Vertebrae: The patient has suffered a compression fracture of T4 with vertebral body height loss of  approximately 60% since the prior study. Mild bony retropulsion off the mid aspect of T4 is identified but the central canal appears open. The patient has undergone vertebral augmentation for the T9 fracture seen on the prior exam. No lytic or sclerotic lesion is identified. Bones are osteopenic. Degenerative endplate signal change anteriorly at T6-7 noted. Paraspinal and other soft tissues: Dependent atelectasis is seen. Calcific aortic and coronary atherosclerosis is noted. Calcified mediastinal and hilar lymph nodes consistent with old granulomatous disease are seen. The patient has a hiatal hernia. Disc levels: The central canal and foramina appear open at all levels. IMPRESSION: Acute or subacute T4 compression fracture with vertebral body height loss of approximately 60% is new since the comparison MRI. Mild bony retropulsion off the central aspect of T4 is noted but no central canal stenosis is seen. The fracture has an appearance most consistent with a senile osteoporotic injury. Status post T9 vertebral augmentation since the prior exam without complicating feature. Marked osteopenia. Calcific aortic and coronary atherosclerosis. Hiatal hernia. Electronically Signed   By: Inge Rise M.D.   On: 11/05/2019 13:07   CT LUMBAR SPINE WO CONTRAST  Result Date: 11/04/2019 CLINICAL DATA:  Low back and left leg pain after falling. History of spinal augmentation. EXAM: CT LUMBAR SPINE WITHOUT CONTRAST TECHNIQUE: Multidetector CT imaging of the lumbar spine was performed without intravenous contrast administration. Multiplanar CT image reconstructions were also generated. COMPARISON:  CT lumbar spine 09/10/2007. Lumbar MRI 10/27/2016. Lumbar radiographs 09/04/2019. FINDINGS: Segmentation: There are 5 lumbar type vertebral bodies. The bones are severely demineralized. Alignment: Normal. Vertebrae: No definite acute osseous findings are seen. The L2 vertebral body has a stable appearance post spinal  augmentation. Cement extension into the L2-3 disc is unchanged. There are grossly stable biconcave L3 and superior endplate compression deformities at L4. Paraspinal and other soft tissues: No acute paraspinal findings. Diffuse aortic and branch vessel atherosclerosis and hepatomegaly are noted. Disc levels: No large disc herniation or acute disc space findings are identified. Chronic osseous retropulsion at the L2 compression fracture appears stable. There is stable disc bulging, endplate osteophytes and facet hypertrophy in the lower lumbar spine, contributing to stable mild foraminal narrowing. IMPRESSION: 1. No acute osseous findings in the lumbar spine. 2. Stable chronic compression deformities at L2, L3 and L4. 3. Aortic Atherosclerosis (ICD10-I70.0). Electronically Signed   By: Richardean Sale M.D.   On: 11/04/2019 15:28   CT PELVIS WO CONTRAST  Result Date: 11/04/2019 CLINICAL DATA:  Low back and left leg pain after falling. History of spinal augmentation. EXAM: CT PELVIS WITHOUT CONTRAST TECHNIQUE: Multidetector CT imaging of the pelvis  was performed following the standard protocol without intravenous contrast. COMPARISON:  Pelvic radiographs today.  Pelvic CT 07/01/2017. FINDINGS: Urinary Tract: The visualized distal ureters and bladder appear unremarkable. Bowel: No bowel wall thickening, distention or surrounding inflammation identified within the pelvis. Mild sigmoid colon diverticular changes. Vascular/Lymphatic: No enlarged pelvic lymph nodes identified. Diffuse aortoiliac atherosclerosis. Reproductive: The uterus and ovaries appear stable. There is a calcified anterior uterine fibroid. No adnexal mass. Other: No pelvic ascites. Musculoskeletal: There is new asymmetric enlargement of the left piriformis muscle (image 24/3), likely indicative of an acute hematoma. There is chronic atrophy of the right piriformis muscle. No other muscular abnormalities or hematomas are seen. The bones are diffusely  demineralized. No evidence of acute fracture or dislocation. Stable sclerosis posteriorly in both iliac bones and stable chronic posttraumatic deformities of both pubic rami. There is also deformity of the lower sacrum on the sagittal images which appears new compared with prior CT, although demonstrates no definite acute features. IMPRESSION: 1. New asymmetric enlargement of the left piriformis muscle, likely indicative of an acute hematoma. 2. No evidence of acute fracture or dislocation. Age indeterminate lower sacral deformity, not likely acute. 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 3. Aortic Atherosclerosis (ICD10-I70.0). Electronically Signed   By: Richardean Sale M.D.   On: 11/04/2019 15:35   DG Chest Port 1 View  Result Date: 11/05/2019 CLINICAL DATA:  Acute respiratory failure. EXAM: PORTABLE CHEST 1 VIEW COMPARISON:  CT of the chest 11/04/2019 FINDINGS: Heart is mildly enlarged. Increasing asymmetric left-sided edema and pulmonary vascular congestion is present. Left basilar airspace opacity is noted. IMPRESSION: 1. Increasing asymmetric left-sided edema and pulmonary vascular congestion. 2. Left basilar airspace disease. Electronically Signed   By: San Morelle M.D.   On: 11/05/2019 07:37   EEG adult  Result Date: 11/05/2019 Lora Havens, MD     11/05/2019  1:17 PM Patient Name: Destina Mantei MRN: [id number] Epilepsy Attending: Lora Havens Referring Physician/Provider: Dr. 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  EEG telemetry for seizures. Level of alertness: Awake, asleep AEDs during EEG study: Keppra Technical aspects: This EEG study was done with scalp electrodes positioned according to the 10-20 International system of electrode placement. Electrical activity was acquired at a sampling rate of 500Hz  and reviewed with a high  frequency filter of 70Hz  and a low frequency filter of 1Hz . EEG data were recorded continuously and digitally stored. Description: No clear posterior dominant rhythm was seen. Sleep was characterized by sleep spindles (12 to 14 Hz), maximal frontocentral region.  EEG showed continuous generalized and maximal right temporal region 3 to 6 Hz theta-delta slowing admixed with generalized 9-15 Hz alpha-beta activity.  Sharp transients were seen in the right frontal region. Hyperventilation and photic stimulation were not performed.   ABNORMALITY -Continuous slow, generalized and maximal right temporal region IMPRESSION: This study is suggestive of cortical dysfunction in right temporal region likely secondary to underlying structural abnormality as well as mild to moderate diffuse encephalopathy, nonspecific etiology.  No seizures or definite epileptiform discharges were seen throughout the recording. Lora Havens   DG Femur Min 2 Views Left  Result Date: 11/04/2019 CLINICAL DATA:  Left leg pain secondary to a fall today. EXAM: LEFT FEMUR 2 VIEWS COMPARISON:  None. FINDINGS: There is no evidence of fracture or other focal bone lesions. Left knee arthroplasty. No evidence of loosening of the components. Soft tissues  are unremarkable. IMPRESSION: No acute abnormality. Electronically Signed   By: Lorriane Shire M.D.   On: 11/04/2019 13:22    Lab Data:  CBC: Recent Labs  Lab 11/04/19 1700 11/05/19 0659 11/06/19 0145 11/08/19 0318 11/11/19 0425  WBC 12.4* 12.2* 11.3* 8.1 10.2  NEUTROABS 10.9* 11.0* 10.1* 6.6  --   HGB 16.2* 15.1* 14.3 14.4 15.4*  HCT 53.0* 49.0* 46.0 46.5* 46.9*  MCV 96.5 95.3 95.2 96.5 93.4  PLT 424* 385 323 376 220   Basic Metabolic Panel: Recent Labs  Lab 11/05/19 0659 11/05/19 0659 11/06/19 0145 11/08/19 0318 11/09/19 0236 11/10/19 0651 11/11/19 0425  NA 136   < > 135 134* 133* 134* 133*  K 3.9   < > 3.8 3.9 3.8 3.8 5.2*  CL 100   < > 98 95* 97* 95* 96*  CO2 24   <  > 26 30 25 28 27   GLUCOSE 154*   < > 153* 118* 109* 112* 132*  BUN 9   < > 12 15 16 21  24*  CREATININE 0.68   < > 0.64 0.50 0.54 0.57 0.45  CALCIUM 9.4   < > 9.0 8.9 8.9 9.0 9.3  MG 2.2  --  2.2  --   --   --  2.2   < > = values in this interval not displayed.   GFR: Estimated Creatinine Clearance: 45.5 mL/min (by C-G formula based on SCr of 0.45 mg/dL). Liver Function Tests: Recent Labs  Lab 11/04/19 1700 11/05/19 0659 11/06/19 0145 11/11/19 0425  AST 60* 34 21 20  ALT 34 28 20 11   ALKPHOS 71 57 52 60  BILITOT 2.4* 3.0* 2.3* 1.6*  PROT 7.3 6.4* 5.8* 5.7*  ALBUMIN 4.3 3.6 3.2* 2.7*   No results for input(s): LIPASE, AMYLASE in the last 168 hours. No results for input(s): AMMONIA in the last 168 hours. Coagulation Profile: Recent Labs  Lab 11/04/19 1700  INR 1.1   Cardiac Enzymes: No results for input(s): CKTOTAL, CKMB, CKMBINDEX, TROPONINI in the last 168 hours. BNP (last 3 results) No results for input(s): PROBNP in the last 8760 hours. HbA1C: No results for input(s): HGBA1C in the last 72 hours. CBG: Recent Labs  Lab 11/05/19 0054 11/05/19 1918  GLUCAP 154* 101*   Lipid Profile: No results for input(s): CHOL, HDL, LDLCALC, TRIG, CHOLHDL, LDLDIRECT in the last 72 hours. Thyroid Function Tests: No results for input(s): TSH, T4TOTAL, FREET4, T3FREE, THYROIDAB in the last 72 hours. Anemia Panel: No results for input(s): VITAMINB12, FOLATE, FERRITIN, TIBC, IRON, RETICCTPCT in the last 72 hours. Urine analysis:    Component Value Date/Time   COLORURINE YELLOW 11/06/2019 1638   APPEARANCEUR CLEAR 11/06/2019 1638   LABSPEC 1.017 11/06/2019 1638   LABSPEC 1.010 05/27/2013 1433   PHURINE 6.0 11/06/2019 1638   GLUCOSEU 50 (A) 11/06/2019 1638   GLUCOSEU NEGATIVE 09/02/2017 1347   HGBUR NEGATIVE 11/06/2019 1638   HGBUR negative 10/06/2009 1015   BILIRUBINUR NEGATIVE 11/06/2019 1638   BILIRUBINUR Negative 01/21/2018 1618   KETONESUR 5 (A) 11/06/2019 1638    PROTEINUR 30 (A) 11/06/2019 1638   UROBILINOGEN negative (A) 01/21/2018 1618   UROBILINOGEN 0.2 09/02/2017 1347   UROBILINOGEN 0.2 05/27/2013 1433   NITRITE NEGATIVE 11/06/2019 1638   LEUKOCYTESUR NEGATIVE 11/06/2019 1638     Dajon Rowe M.D. [facility] 11/11/2019, 2:22 PM   Call night coverage person covering after 7pm

## 2019-11-11 NOTE — Progress Notes (Addendum)
11/11/19 1532  PT Visit Information  Last PT Received On 11/11/19  Assistance Needed +2  PT/OT/SLP Co-Evaluation/Treatment Yes  Reason for Co-Treatment Necessary to address cognition/behavior during functional activity;For patient/therapist safety;Complexity of the patient's impairments (multi-system involvement);To address functional/ADL transfers  PT goals addressed during session Mobility/safety with mobility  History of Present Illness Pt is a 86 y/o female with PMH of polycythemia vera, breast cancer, CAD, CVA, SOA admitted after mechanical fall tripping over a curb. Found with [facility] on CT imaging, neurosurgery consulted and not candidate for surgical intervention. Seizure like activity and started on Keppra. Found with T4 compression fracture and s/p T4 vertebroplasty  6/29.  Pending imaging L shoulder and L ribs.   Precautions  Precautions Fall;Back  Precaution Booklet Issued No  Restrictions  Weight Bearing Restrictions No  Home Living  Family/patient expects to be discharged to: Private residence  Living Arrangements Alone  Available Help at Discharge Available PRN/intermittently  Type of [place] Access Level entry  [place] Two level;Able to live on main level with bedroom/bathroom  Bathroom Shower/Tub [employer] - quad;Shower seat;Walker - 2 wheels  Prior Function  Level of Independence Independent with assistive device(s)  Comments Uses quad cane for mobility, independent ADLs, IADls and driving    Communication  Communication HOH  Pain Assessment  Pain Assessment Faces  Faces Pain Scale 8  Pain Location back, headache, L shoulder, L ribs  Pain Descriptors / Indicators Discomfort;Grimacing;Guarding;Headache  Pain Intervention(s) Limited activity within patient's tolerance;Monitored during session;Patient requesting pain meds-RN notified;Repositioned  Cognition  Arousal/Alertness Awake/alert  Behavior During Therapy Anxious   Overall Cognitive Status Impaired/Different from baseline  Area of Impairment Attention;Memory;Following commands;Safety/judgement;Awareness;Problem solving  Current Attention Level Sustained  Memory Decreased short-term memory  Following Commands Follows one step commands consistently;Follows one step commands with increased time  Safety/Judgement Decreased awareness of deficits  Awareness Emergent  Problem Solving Difficulty sequencing;Requires verbal cues;Decreased initiation;Slow processing  General Comments pt requires increased time for processing and initation of tasks, internally distracted by pain but engages appropriately. Anxious for moblization and self limiting at times.   Lower Extremity Assessment  Lower Extremity Assessment Generalized weakness  Cervical / Trunk Assessment  Cervical / Trunk Assessment Other exceptions  Cervical / Trunk Exceptions s/p kyphoplasty  Bed Mobility  Overal bed mobility Needs Assistance  Bed Mobility Rolling  Rolling Max assist;+2 for physical assistance  General bed mobility comments max assist +2 for rolling during hygiene and bed pad change, cueing for techniques and hand placement   Transfers  General transfer comment deferred due to pain   General Comments  General comments (skin integrity, edema, etc.) RN present in room, aware of patients request for pain meds   PT - End of Session  Activity Tolerance Patient limited by pain  Patient left in bed;with call bell/phone within reach;with bed alarm set;with nursing/sitter in room  Nurse Communication Mobility status;Patient requests pain meds  PT Assessment  PT Recommendation/Assessment Patient needs continued PT services  PT Visit Diagnosis Unsteadiness on feet (R26.81);Muscle weakness (generalized) (M62.81);Difficulty in walking, not elsewhere classified (R26.2)  PT Problem List Decreased strength;Decreased balance;Decreased activity tolerance;Decreased mobility;Decreased knowledge of  precautions;Pain;Decreased cognition;Decreased safety awareness;Decreased knowledge of use of DME  PT Plan  PT Frequency (ACUTE ONLY) Min 3X/week  PT Treatment/Interventions (ACUTE ONLY) DME instruction;Gait training;Functional mobility training;Therapeutic activities;Therapeutic exercise;Balance training;Patient/family education  AM-PAC PT "6 Clicks" Mobility Outcome Measure (Version 2)  Help needed turning from your back to  your side while in a flat bed without using bedrails? 1  Help needed moving from lying on your back to sitting on the side of a flat bed without using bedrails? 1  Help needed moving to and from a bed to a chair (including a wheelchair)? 1  Help needed standing up from a chair using your arms (e.g., wheelchair or bedside chair)? 1  Help needed to walk in hospital room? 1  Help needed climbing 3-5 steps with a railing?  1  6 Click Score 6  Consider Recommendation of Discharge To: CIR/SNF/LTACH  PT Recommendation  Follow Up Recommendations SNF;Supervision/Assistance - 24 hour  PT equipment Other (comment) (TBD)  Individuals Consulted  Consulted and Agree with Results and Recommendations Patient  Acute Rehab PT Goals  Patient Stated Goal less pain   PT Goal Formulation With patient  Time For Goal Achievement 11/25/19  Potential to Achieve Goals Fair  PT Time Calculation  PT Start Time (ACUTE ONLY) 1152  PT Stop Time (ACUTE ONLY) 1216  PT Time Calculation (min) (ACUTE ONLY) 24 min  PT General Charges  $$ ACUTE PT VISIT 1 Visit  PT Evaluation  $PT Eval Moderate Complexity 1 Mod  Written Expression  Dominant Hand Right   Pt admitted secondary to problem above with deficits above. Pt very limited secondary to pain this session. Pt requiring max A +2 for basic bed mobility tasks. Pt extremely anxious about performing any mobility and required max encouragement. Feel pt will benefit from SNF level therapies at d/c. Will continue to follow acutely to maximize functional  mobility independence and safety.   Reuel Derby, PT, DPT  Acute Rehabilitation Services  Pager: [phone] Office: [phone]

## 2019-11-11 NOTE — Consult Note (Signed)
   [facility] Inpatient Consult   11/11/2019  Jayne Peckenpaugh 12/29/1929 [id number]  [place] patient: Medicare NextGen   Patient is currently active with [place] [facility] for chronic disease management services.  Patient has been engaged by a [place] Management Coordinator.  Our community based plan of care has focused on disease management and community resource support.   Medical record review reveal patient is being recommended for a skilled facility level of care for transition by OT. [place] PT eval notes. Reviewed inpatient Transition of Care RNCM notes.    Plan: Continue to follow for disposition needs.  If patient goes to a [facility] affiliated facility will update [facility] RN. 1700:  Patient is being recommended for SNF by PT.   Of note, [facility] Care Management services does not replace or interfere with any services that are needed or arranged by inpatient [facility] care management team.  For additional questions or referrals please contact:  Natividad Brood, RN BSN [facility] Liaison  [phone] business mobile phone Toll free office [phone]  Fax number: [phone] [email or web address]

## 2019-11-11 NOTE — TOC Progression Note (Addendum)
Transition of Care [facility]) - Progression Note    Patient Details  Name: [name] MRN: [id number] Date of Birth: 1930-04-27  Transition of Care [facility]) CM/SW Contact  Pollie Friar, RN Phone Number: 11/11/2019, 2:45 PM  Clinical Narrative:    Daughter had requested [facility] for SNF and they wont have a bed until Friday at the earliest. CM will updated the daughter. CM has faxed her out further.  Cm has requested a new covid test for d/c planning and awaiting PT note. TOC following.  1700: daughter has selected [facility]. They have accepted. New covid test ordered.   Expected Discharge Plan: [place] Barriers to Discharge: Continued Medical Work up  Expected Discharge Plan and Services Expected Discharge Plan: [place] In-house Referral: Clinical Social Work Discharge Planning Services: CM Consult Post Acute Care Choice: [place] arrangements for the past 2 months: Single Family Home                                       Social Determinants of Health (SDOH) Interventions    Readmission Risk Interventions Readmission Risk Prevention Plan 09/14/2019  Medication Screening Complete  Transportation Screening Complete  Some recent data might be hidden

## 2019-11-11 NOTE — Anesthesia Postprocedure Evaluation (Signed)
Anesthesia Post Note  Patient: [name]  Procedure(s) Performed: VERTEBROPLASTY (N/A )     Patient location during evaluation: Other Anesthesia Type: General Level of consciousness: awake and alert Pain management: pain level controlled Vital Signs Assessment: post-procedure vital signs reviewed and stable Respiratory status: spontaneous breathing, nonlabored ventilation and respiratory function stable Cardiovascular status: blood pressure returned to baseline and stable Postop Assessment: no apparent nausea or vomiting Anesthetic complications: no   No complications documented.  Last Vitals:  Vitals:   11/11/19 0327 11/11/19 0550  BP: (!) 167/79 140/60  Pulse: 76   Resp: 20   Temp: 36.6 C   SpO2: 94%     Last Pain:  Vitals:   11/11/19 0603  TempSrc:   PainSc: Asleep                 Kareem Aul,W. EDMOND

## 2019-11-11 NOTE — TOC CAGE-AID Note (Signed)
Transition of Care [facility]) - CAGE-AID Screening   Patient Details  Name: [name] MRN: [id number] Date of Birth: June 06, 1929  Transition of Care [facility]) CM/SW Contact:    Emeterio Reeve, [place] Phone Number: 11/11/2019, 2:47 PM   Clinical Narrative:  CSW was unable to complete assessment due to pt being sleep and unable to wake.   CAGE-AID Screening: Substance Abuse Screening unable to be completed due to: : Patient unable to participate               [place] Clinical Social Worker [phone]

## 2019-11-11 NOTE — Evaluation (Signed)
Occupational Therapy Evaluation Patient Details Name: [name] MRN: [id number] DOB: 1929-09-05 Today's Date: 11/11/2019    History of Present Illness Pt is a 86 y/o female with PMH of polycythemia vera, breast cancer, CAD, CVA, SOA admitted after mechanical fall tripping over a curb. Found with [facility] on CT imaging, neurosurgery consulted and not candidate for surgical intervention. Seizure like activity and started on Keppra. Found with T4 compression fracture and s/p T4 vertebroplasty  6/29.  Pending imaging L shoulder and L ribs.    Clinical Impression   PTA patient independent with ADLs, IADLs,using quad cane for mobility and driving. Admitted for above and limited by problem list below, including pain (headache, L shoulder/ribs, back), decreased activity tolerance, impaired cognition (problem solving, awareness, memory, attention), decreased functional use of L UE and generalized weakness. She currently requires mod-total assist for ADLs bed level, +2 max assist for rolling in bed and total assist +2 to reposition to [facility].  She requires maximal encouragement to engage with therapists, self limiting at times, highly distracted internally by pain. Patient will benefit from further OT services while admitted and after dc at SNF level to optimize return to PLOF with ADls and mobility.     Follow Up Recommendations  SNF;Supervision/Assistance - 24 hour    Equipment Recommendations  Other (comment) (TBD at next venue of care)    Recommendations for Other Services       Precautions / Restrictions Precautions Precautions: Fall;Back Restrictions Weight Bearing Restrictions: No      Mobility Bed Mobility Overal bed mobility: Needs Assistance Bed Mobility: Rolling Rolling: Max assist;+2 for physical assistance         General bed mobility comments: max assist +2 for rolling during hygiene and bed pad change, cueing for techniques and hand placement   Transfers                  General transfer comment: deferred due to pain     Balance                                           ADL either performed or assessed with clinical judgement   ADL Overall ADL's : Needs assistance/impaired     Grooming: Moderate assistance;Bed level   Upper Body Bathing: Maximal assistance;Bed level   Lower Body Bathing: Total assistance;+2 for physical assistance;Bed level   Upper Body Dressing : Maximal assistance;Bed level   Lower Body Dressing: Total assistance;+2 for physical assistance;Bed level               Functional mobility during ADLs: Maximal assistance;+2 for physical assistance;+2 for safety/equipment General ADL Comments: limited to bed level, pt limited by pain, decreased activity tolerance, cognition      Vision Baseline Vision/History: Wears glasses Wears Glasses: At all times Vision Assessment?: No apparent visual deficits     Perception     Praxis      Pertinent Vitals/Pain Pain Assessment: Faces Faces Pain Scale: Hurts even more Pain Location: back, headache, L shoulder, L ribs Pain Descriptors / Indicators: Discomfort;Grimacing;Guarding;Headache Pain Intervention(s): Limited activity within patient's tolerance;Monitored during session;Repositioned;Patient requesting pain meds-RN notified     Hand Dominance Right   Extremity/Trunk Assessment Upper Extremity Assessment Upper Extremity Assessment: LUE deficits/detail;Generalized weakness LUE Deficits / Details: WFL AROM elbow distal, limited shoulder movement due to pain and pending imaging  LUE: Unable to fully  assess due to pain LUE Coordination: decreased gross motor   Lower Extremity Assessment Lower Extremity Assessment: Defer to PT evaluation   Cervical / Trunk Assessment Cervical / Trunk Assessment: Other exceptions Cervical / Trunk Exceptions: s/p kyphoplasty   Communication Communication Communication: HOH   Cognition Arousal/Alertness:  Awake/alert Behavior During Therapy: Anxious Overall Cognitive Status: Impaired/Different from baseline Area of Impairment: Attention;Memory;Following commands;Safety/judgement;Awareness;Problem solving                   Current Attention Level: Sustained Memory: Decreased short-term memory Following Commands: Follows one step commands consistently;Follows one step commands with increased time Safety/Judgement: Decreased awareness of deficits Awareness: Emergent Problem Solving: Difficulty sequencing;Requires verbal cues;Decreased initiation;Slow processing General Comments: pt requires increased time for processing and initation of tasks, internally distracted by pain but engages appropriately. Anxious for moblization and self limiting at times.    General Comments  RN present in room, aware of patients request for pain meds     Exercises     Shoulder Instructions      Home Living Family/patient expects to be discharged to:: Private residence Living Arrangements: Alone Available Help at Discharge: Available PRN/intermittently Type of Home: House Home Access: Level entry     [place]: Two level;Able to live on main level with bedroom/bathroom     Bathroom Shower/Tub: Walk-in shower         Home Equipment: [place] - quad;Shower seat;Walker - 2 wheels          Prior Functioning/Environment Level of Independence: Independent with assistive device(s)        Comments: Uses quad cane for mobility, independent ADLs, IADls and driving          OT Problem List: Decreased strength;Decreased range of motion;Decreased activity tolerance;Decreased coordination;Decreased cognition;Decreased safety awareness;Decreased knowledge of use of DME or AE;Decreased knowledge of precautions;Pain;Impaired UE functional use      OT Treatment/Interventions: Self-care/ADL training;Therapeutic exercise;DME and/or AE instruction;Therapeutic activities;Cognitive  remediation/compensation;Patient/family education;Balance training    OT Goals(Current goals can be found in the care plan section) Acute Rehab OT Goals Patient Stated Goal: less pain  OT Goal Formulation: With patient Time For Goal Achievement: 11/25/19 Potential to Achieve Goals: Fair  OT Frequency: Min 2X/week   Barriers to D/C:            Co-evaluation PT/OT/SLP Co-Evaluation/Treatment: Yes Reason for Co-Treatment: For patient/therapist safety;To address functional/ADL transfers;Complexity of the patient's impairments (multi-system involvement);Necessary to address cognition/behavior during functional activity   OT goals addressed during session: ADL's and self-care      AM-PAC OT "6 Clicks" Daily Activity     Outcome Measure Help from another person eating meals?: A Little Help from another person taking care of personal grooming?: A Lot Help from another person toileting, which includes using toliet, bedpan, or urinal?: Total Help from another person bathing (including washing, rinsing, drying)?: A Lot Help from another person to put on and taking off regular upper body clothing?: A Lot Help from another person to put on and taking off regular lower body clothing?: Total 6 Click Score: 11   End of Session Nurse Communication: Mobility status;Precautions;Patient requests pain meds  Activity Tolerance: Patient limited by pain Patient left: in bed;with call bell/phone within reach;with bed alarm set;with nursing/sitter in room  OT Visit Diagnosis: Other abnormalities of gait and mobility (R26.89);Pain;Other symptoms and signs involving cognitive function;Muscle weakness (generalized) (M62.81);History of falling (Z91.81) Pain - Right/Left: Left Pain - part of body: Shoulder (ribs, back, headache)  Time: 4949-4473 OT Time Calculation (min): 24 min Charges:  OT General Charges $OT Visit: 1 Visit OT Evaluation $OT Eval Moderate Complexity: 1 Mod  Jolaine Artist, OT Acute Rehabilitation Services Pager [phone] Office [phone]   Delight Stare 11/11/2019, 12:46 PM

## 2019-11-11 NOTE — Progress Notes (Signed)
NIR.  History of T4 compression fracture s/p T4 vertebroplasty 11/10/2019 by Dr. Estanislado Pandy.  Went to evaluate patient bedside alongside Dr. Estanislado Pandy. Patient awake and alert laying in bed. States her back pain is "a lot better" following procedure. Still complaining of severe headache, unchanged.  Please call NIR with questions/concerns.   Dawn Graff Asenath Balash, PA-C 11/11/2019, 11:05 AM

## 2019-11-11 NOTE — Progress Notes (Signed)
  Chronic Care Management   Outreach Note  11/11/2019 Name: [name] MRN: [id number] DOB: 18-Oct-1929  Referred by: Colon Branch, MD Reason for referral : No chief complaint on file.   Third unsuccessful telephone outreach was attempted today. The patient was referred to the pharmacist for assistance with care management and care coordination.   Follow Up Plan:   Welcome

## 2019-11-12 ENCOUNTER — Other Ambulatory Visit: Payer: Self-pay

## 2019-11-12 DIAGNOSIS — W19XXXA Unspecified fall, initial encounter: Secondary | ICD-10-CM | POA: Diagnosis not present

## 2019-11-12 DIAGNOSIS — I639 Cerebral infarction, unspecified: Secondary | ICD-10-CM | POA: Diagnosis not present

## 2019-11-12 DIAGNOSIS — S31819D Unspecified open wound of right buttock, subsequent encounter: Secondary | ICD-10-CM | POA: Diagnosis not present

## 2019-11-12 DIAGNOSIS — K219 Gastro-esophageal reflux disease without esophagitis: Secondary | ICD-10-CM | POA: Diagnosis not present

## 2019-11-12 DIAGNOSIS — F341 Dysthymic disorder: Secondary | ICD-10-CM

## 2019-11-12 DIAGNOSIS — S22040D Wedge compression fracture of fourth thoracic vertebra, subsequent encounter for fracture with routine healing: Secondary | ICD-10-CM | POA: Diagnosis not present

## 2019-11-12 DIAGNOSIS — R278 Other lack of coordination: Secondary | ICD-10-CM | POA: Diagnosis not present

## 2019-11-12 DIAGNOSIS — M6281 Muscle weakness (generalized): Secondary | ICD-10-CM | POA: Diagnosis not present

## 2019-11-12 DIAGNOSIS — I5030 Unspecified diastolic (congestive) heart failure: Secondary | ICD-10-CM | POA: Diagnosis not present

## 2019-11-12 DIAGNOSIS — I2510 Atherosclerotic heart disease of native coronary artery without angina pectoris: Secondary | ICD-10-CM | POA: Diagnosis not present

## 2019-11-12 DIAGNOSIS — S22000A Wedge compression fracture of unspecified thoracic vertebra, initial encounter for closed fracture: Secondary | ICD-10-CM | POA: Diagnosis not present

## 2019-11-12 DIAGNOSIS — F329 Major depressive disorder, single episode, unspecified: Secondary | ICD-10-CM | POA: Diagnosis not present

## 2019-11-12 DIAGNOSIS — F419 Anxiety disorder, unspecified: Secondary | ICD-10-CM | POA: Diagnosis not present

## 2019-11-12 DIAGNOSIS — W19XXXD Unspecified fall, subsequent encounter: Secondary | ICD-10-CM | POA: Diagnosis not present

## 2019-11-12 DIAGNOSIS — R2681 Unsteadiness on feet: Secondary | ICD-10-CM | POA: Diagnosis not present

## 2019-11-12 DIAGNOSIS — C50919 Malignant neoplasm of unspecified site of unspecified female breast: Secondary | ICD-10-CM | POA: Diagnosis not present

## 2019-11-12 DIAGNOSIS — R0989 Other specified symptoms and signs involving the circulatory and respiratory systems: Secondary | ICD-10-CM | POA: Diagnosis not present

## 2019-11-12 DIAGNOSIS — R079 Chest pain, unspecified: Secondary | ICD-10-CM | POA: Diagnosis not present

## 2019-11-12 DIAGNOSIS — R1319 Other dysphagia: Secondary | ICD-10-CM | POA: Diagnosis not present

## 2019-11-12 DIAGNOSIS — K5792 Diverticulitis of intestine, part unspecified, without perforation or abscess without bleeding: Secondary | ICD-10-CM | POA: Diagnosis not present

## 2019-11-12 DIAGNOSIS — I10 Essential (primary) hypertension: Secondary | ICD-10-CM | POA: Diagnosis not present

## 2019-11-12 DIAGNOSIS — M25512 Pain in left shoulder: Secondary | ICD-10-CM | POA: Diagnosis not present

## 2019-11-12 DIAGNOSIS — R05 Cough: Secondary | ICD-10-CM | POA: Diagnosis not present

## 2019-11-12 DIAGNOSIS — Z9181 History of falling: Secondary | ICD-10-CM | POA: Diagnosis not present

## 2019-11-12 DIAGNOSIS — I7300 Raynaud's syndrome without gangrene: Secondary | ICD-10-CM | POA: Diagnosis not present

## 2019-11-12 DIAGNOSIS — M810 Age-related osteoporosis without current pathological fracture: Secondary | ICD-10-CM | POA: Diagnosis not present

## 2019-11-12 DIAGNOSIS — G4733 Obstructive sleep apnea (adult) (pediatric): Secondary | ICD-10-CM | POA: Diagnosis not present

## 2019-11-12 DIAGNOSIS — D45 Polycythemia vera: Secondary | ICD-10-CM | POA: Diagnosis not present

## 2019-11-12 DIAGNOSIS — R489 Unspecified symbolic dysfunctions: Secondary | ICD-10-CM | POA: Diagnosis not present

## 2019-11-12 DIAGNOSIS — R519 Headache, unspecified: Secondary | ICD-10-CM | POA: Diagnosis present

## 2019-11-12 DIAGNOSIS — I619 Nontraumatic intracerebral hemorrhage, unspecified: Secondary | ICD-10-CM | POA: Diagnosis not present

## 2019-11-12 DIAGNOSIS — M5440 Lumbago with sciatica, unspecified side: Secondary | ICD-10-CM | POA: Diagnosis not present

## 2019-11-12 DIAGNOSIS — S2242XD Multiple fractures of ribs, left side, subsequent encounter for fracture with routine healing: Secondary | ICD-10-CM | POA: Diagnosis not present

## 2019-11-12 DIAGNOSIS — G9341 Metabolic encephalopathy: Secondary | ICD-10-CM | POA: Diagnosis not present

## 2019-11-12 DIAGNOSIS — S31819A Unspecified open wound of right buttock, initial encounter: Secondary | ICD-10-CM | POA: Diagnosis not present

## 2019-11-12 DIAGNOSIS — R569 Unspecified convulsions: Secondary | ICD-10-CM | POA: Diagnosis not present

## 2019-11-12 LAB — BASIC METABOLIC PANEL
Anion gap: 9 (ref 5–15)
BUN: 24 mg/dL — ABNORMAL HIGH (ref 8–23)
CO2: 27 mmol/L (ref 22–32)
Calcium: 8.8 mg/dL — ABNORMAL LOW (ref 8.9–10.3)
Chloride: 96 mmol/L — ABNORMAL LOW (ref 98–111)
Creatinine, Ser: 0.54 mg/dL (ref 0.44–1.00)
GFR calc Af Amer: >60 mL/min (ref >60)
GFR calc non Af Amer: >60 mL/min (ref >60)
Glucose, Bld: 110 mg/dL — ABNORMAL HIGH (ref 70–99)
Potassium: 3.7 mmol/L (ref 3.5–5.1)
Sodium: 132 mmol/L — ABNORMAL LOW (ref 135–145)

## 2019-11-12 MED ORDER — OXYCODONE-ACETAMINOPHEN 5-325 MG PO TABS
2.0000 | ORAL_TABLET | Freq: Four times a day (QID) | ORAL | Status: DC | PRN
  Filled 2019-11-12: qty 2

## 2019-11-12 MED ORDER — LORAZEPAM 0.5 MG PO TABS: ORAL_TABLET | ORAL | 0 refills | Status: DC

## 2019-11-12 MED ORDER — TIZANIDINE HCL 4 MG PO TABS
2.0000 mg | ORAL_TABLET | Freq: Three times a day (TID) | ORAL | Status: DC
  Administered 2019-11-12: 2 mg via ORAL
  Filled 2019-11-12: qty 1

## 2019-11-12 MED ORDER — LEVETIRACETAM 500 MG PO TABS
500.0000 mg | ORAL_TABLET | Freq: Two times a day (BID) | ORAL | Status: DC
  Administered 2019-11-12: 500 mg via ORAL
  Filled 2019-11-12: qty 1

## 2019-11-12 MED ORDER — HYDROCODONE-ACETAMINOPHEN 5-325 MG PO TABS: 1.0000 | ORAL_TABLET | ORAL | 0 refills | Status: DC | PRN

## 2019-11-12 MED ORDER — LEVETIRACETAM 500 MG PO TABS: 500.0000 mg | ORAL_TABLET | Freq: Two times a day (BID) | ORAL | 0 refills | Status: DC

## 2019-11-12 MED ORDER — LIDOCAINE 4 % EX PTCH: MEDICATED_PATCH | CUTANEOUS | 0 refills | Status: DC

## 2019-11-12 MED ORDER — ACETAMINOPHEN 500 MG PO TABS: 1000.0000 mg | ORAL_TABLET | Freq: Three times a day (TID) | ORAL | 0 refills | Status: AC

## 2019-11-12 NOTE — Care Management Important Message (Signed)
Important Message  Patient Details  Name: [name] MRN: [id number] Date of Birth: [birth year]   Medicare Important Message Given:  Yes     Nikeshia Keetch 11/12/2019, 1:06 PM

## 2019-11-12 NOTE — Patient Outreach (Signed)
[facility]) Care Management  11/12/2019  [name] 01/11/1930 [id number]   Case closure:  Patient in patient greater than 10 days.     Case closed: discharged to SNF after hospital admission  [name], RN, BSN, [facility] [employer] [phone]

## 2019-11-12 NOTE — TOC Transition Note (Signed)
Transition of Care [facility]) - CM/SW Discharge Note   Patient Details  Name: [name] MRN: [id number] Date of Birth: 1930/03/09  Transition of Care [facility]) CM/SW Contact:  Pollie Friar, RN Phone Number: 11/12/2019, 2:01 PM   Clinical Narrative:    Pt discharging to [facility] today. Pt will transport via [facility] and report has been called. Daughter at the bedside and aware of plan. Bedside RN updated and d/c packet at the desk.   Room:  202 Number for report: [phone]   Final next level of care: Skilled Nursing Facility Barriers to Discharge: No Barriers Identified   Patient Goals and CMS Choice   CMS Medicare.gov Compare Post Acute Care list provided to:: Patient Represenative (must comment) Choice offered to / list presented to : Adult Children  Discharge Placement              Patient chooses bed at: [place], [place] Patient to be transferred to facility by: [place] Name of family member notified: Early Chars Patient and family notified of of transfer: 11/12/19  Discharge Plan and Services In-house Referral: Clinical Social Work Discharge Planning Services: CM Consult Post Acute Care Choice: [place]                               Social Determinants of Health (SDOH) Interventions     Readmission Risk Interventions Readmission Risk Prevention Plan 09/14/2019  Medication Screening Complete  Transportation Screening Complete  Some recent data might be hidden

## 2019-11-12 NOTE — Progress Notes (Signed)
Responded to consult to assist family and patient with understanding AD.  AD form was given to daughter and Chaplain explained the form and what to do next. Patient is preparing to go to [facility].  Chaplain will follow as needed.  Jaclynn Major, [place], [facility], Pager [phone]

## 2019-11-12 NOTE — Discharge Summary (Signed)
Physician Discharge Summary   Patient ID: [name] MRN: [id number] DOB/AGE: [id number] y.o.  Admit date: 11/04/2019 Discharge date: 11/12/2019  Primary Care Physician:  Colon Branch, MD   Recommendations for Outpatient Follow-up:  1. Follow up with PCP in 1-2 weeks 2. Please obtain BMP/CBC in one week  3. Continue Keppra 500 mg twice a day for 1 month, outpatient neurology follow-up in 4 weeks.  Neurology will determine if patient needs to continue with Keppra 4. Continue ribs wrap for the pain due to multiple rib fractures, incentive spirometry 5. Outpatient follow-up with neurosurgery, Dr. Ronnald Ramp in 2 weeks 6. Fall precautions  Home Health: Patient being discharged to skilled nursing facility Equipment/Devices:   Discharge Condition: stable  CODE STATUS: FULL  Diet recommendation: Heart healthy diet   Discharge Diagnoses:    Mechanical fall . ICH (intracerebral hemorrhage) (HCC) Acute metabolic encephalopathy Seizure secondary to [place] Multiple left-sided rib fractures Essential hypertension Anxiety New T4 compression fracture Acute pain Polycythemia vera Stage I CA of left breast status post lumpectomy 2013  Consults: Neurosurgery Neurology CCM Trauma surgery    Allergies:   Allergies  Allergen Reactions  . Penicillins Hives, Rash and Other (See Comments)    Has patient had a PCN reaction causing immediate rash, facial/tongue/throat swelling, SOB or lightheadedness with hypotension: Yes Has patient had a PCN reaction causing severe rash involving mucus membranes or skin necrosis: yes Has patient had a PCN reaction that required hospitalization: No Has patient had a PCN reaction occurring within the last 10 years: no If all of the above answers are "NO", then may proceed with Cephalosporin use.  Other Reaction: OTHER REACTION  . Valsartan Itching and Other (See Comments)  . Atorvastatin Diarrhea  . Carvedilol Other (See Comments)    "teeth  hurt when I took it"   . Ezetimibe Nausea Only  . Fluvastatin Sodium Other (See Comments)    "Can't remember"  . Magnesium Hydroxide Other (See Comments)    REACTION: triggers HAs  . Meloxicam Other (See Comments)    Pt seeing auro's / spots.    . Pneumovax [Pneumococcal Polysaccharide Vaccine] Other (See Comments)    Local reaction  . Quinapril Hcl Other (See Comments)     "feelings of tiredness"  . Simvastatin Diarrhea  . Topamax [Topiramate] Itching  . Vit D-Vit E-Safflower Oil Other (See Comments)    Headaches     DISCHARGE MEDICATIONS: Allergies as of 11/12/2019      Reactions   Penicillins Hives, Rash, Other (See Comments)   Has patient had a PCN reaction causing immediate rash, facial/tongue/throat swelling, SOB or lightheadedness with hypotension: Yes Has patient had a PCN reaction causing severe rash involving mucus membranes or skin necrosis: yes Has patient had a PCN reaction that required hospitalization: No Has patient had a PCN reaction occurring within the last 10 years: no If all of the above answers are "NO", then may proceed with Cephalosporin use. Other Reaction: OTHER REACTION   Valsartan Itching, Other (See Comments)   Atorvastatin Diarrhea   Carvedilol Other (See Comments)   "teeth hurt when I took it"   Ezetimibe Nausea Only   Fluvastatin Sodium Other (See Comments)   "Can't remember"   Magnesium Hydroxide Other (See Comments)   REACTION: triggers HAs   Meloxicam Other (See Comments)   Pt seeing auro's / spots.     Pneumovax [pneumococcal Polysaccharide Vaccine] Other (See Comments)   Local reaction   Quinapril Hcl Other (See Comments)    "  feelings of tiredness"   Simvastatin Diarrhea   Topamax [topiramate] Itching   Vit D-vit E-safflower Oil Other (See Comments)   Headaches      Medication List    STOP taking these medications   ibuprofen 200 MG tablet Commonly known as: ADVIL     TAKE these medications   acetaminophen 500 MG  tablet Commonly known as: TYLENOL Take 2 tablets (1,000 mg total) by mouth every 8 (eight) hours for 3 days.   aspirin 81 MG tablet Take 81 mg by mouth daily.   bisacodyl 10 MG suppository Commonly known as: DULCOLAX Place 1 suppository (10 mg total) rectally daily as needed for moderate constipation.   bismuth subsalicylate 102 HE/52DP suspension Commonly known as: PEPTO BISMOL Take 30 mLs by mouth daily as needed for indigestion or diarrhea or loose stools.   BOOST BREEZE PO Take 1 each by mouth daily.   docusate sodium 100 MG capsule Commonly known as: COLACE Take 100 mg by mouth 2 (two) times daily.   HYDROcodone-acetaminophen 5-325 MG tablet Commonly known as: NORCO/VICODIN Take 1-2 tablets by mouth every 4 (four) hours as needed for moderate pain or severe pain. What changed:   how much to take  when to take this  reasons to take this   hydroxyurea 500 MG capsule Commonly known as: HYDREA Take 1 capsule (500 mg total) by mouth every other day. May take with food to minimize GI side effects.   levETIRAcetam 500 MG tablet Commonly known as: KEPPRA Take 1 tablet (500 mg total) by mouth 2 (two) times daily.   Lidocaine 4 % Ptch LIDOCAINE 4% PATCH :Apply 1 patch topically daily and remove after 12 hours to left lateral chest What changed: additional instructions   LORazepam 0.5 MG tablet Commonly known as: ATIVAN TAKE 1/2 TABLET BY MOUTH EVERY MORNING AND EVERY AFTERNOON. THEN TAKE 2 TABLETS BY MOUTH AT BEDTIME AS NEEDED What changed:   how much to take  how to take this  when to take this  additional instructions   metoprolol tartrate 50 MG tablet Commonly known as: LOPRESSOR Take 1 tablet (50 mg total) by mouth 2 (two) times daily.   omeprazole 20 MG capsule Commonly known as: PRILOSEC Take 1 capsule (20 mg total) by mouth daily.   ondansetron 4 MG tablet Commonly known as: ZOFRAN Take 1 tablet (4 mg total) by mouth every 6 (six) hours as needed  for nausea or vomiting.   polyethylene glycol 17 g packet Commonly known as: MIRALAX / GLYCOLAX Take 17 g by mouth daily.   tizanidine 2 MG capsule Commonly known as: Zanaflex Take 1 capsule (2 mg total) by mouth 3 (three) times daily as needed for muscle spasms.   valACYclovir 500 MG tablet Commonly known as: VALTREX Take 2 tablets at onset of rash and 2 tablets 12 hours later as needed.            Discharge Care Instructions  (From admission, onward)         Start     Ordered   11/12/19 0000  Discharge wound care:       Comments: Cont wound care as per instructions   11/12/19 1014           Brief H and P: For complete details please refer to admission H and P, but in brief 86 year old with multiple medical issues including Polycythemia vera, breast cancer, CAD, CVA, diverticulitis, GERD, CVA, OSA. Admitted with mechanical fall after tripping over curb.. Found  to have ICH, SAH on CT imaging. She is not a candidate for surgical intervention. She had a seizure activity in ED with 1 min of full tonic-clonic activity and given ativan and loaded with keppra. PCCM consulted for admission  Hospital Course:    ICH (intracerebral hemorrhage) ([facility]) secondary to mechanical fall -Secondary to mechanical fall.    - CT imaging showed small right-sided temporal lobe SDH as well as hemorrhagic contusion.  -Seen by neurosurgery, surgical intervention is not warranted at this time -Neurosurgery recommended Keppra, follow-up 2 weeks after discharge for repeat CT of the brain   Acute metabolic encephalopathy, seizure activity -Patient had seizure activity in ED with 1 minute of full tonic-clonic activity and was given Ativan and loaded with Keppra.   -Seen by neurology, Dr Leonel Ramsay recommended EEG, Keppra -EEG showed cortical dysfunction in the right temporal region secondary to underlying structural abnormality, mild to moderate diffuse encephalopathy, nonspecific, no definitive  epileptiform discharges. -Neurology recommended continue Keppra 500 mg twice daily for 1 month, outpatient neurology follow-up in 4 weeks, ambulatory referral sent -Currently alert and awake, oriented, no repeat seizures  Left-sided shoulder and chest pain secondary to multiple left lateral ribs fractures -Patient complaining of left-sided chest pain under the breast, tender to palpation, shoulder pain -Left shoulder x-ray with no fractures -Left-sided rib series showed multiple slightly displaced left-sided rib fractures without pneumothorax.  There are fractures of left lateral fourth fifth sixth seventh eighth and ninth ribs. -Continue pain control, PT, Lidoderm patch to the left lateral chest.   Essential hypertension  -BP currently stable, continue metoprolol  New T4 compression fracture -Patient was seen by neurosurgery, did not recommend TLSO with cervical extension, recommended kyphoplasty.  Seen by trauma surgery as well -Underwent kyphoplasty on 11/10/2019 Continue physical therapy, pain control   Anxiety Continue lorazepam   Polycythemia vera, stage I ca of left breast s/p lumpectomy in 2013 Followed by oncology with regular phlebotomy and IV iron Resumed hydroxyurea   Day of Discharge S: Complaining of pain, in the left rib area, no fevers or chills, no acute shortness of breath.  BP (!) 153/70 (BP Location: Right Arm)   Pulse 71   Temp 97.7 F (36.5 C) (Oral)   Resp 18   Ht 5' 5.98" (1.676 m)   Wt 70.1 kg   SpO2 93%   BMI 24.96 kg/m   Physical Exam: General: Alert and awake oriented x3 not in any acute distress.  Uncomfortable HEENT: anicteric sclera, pupils reactive to light and accommodation CVS: S1-S2 clear no murmur rubs or gallops Chest: clear to auscultation bilaterally, no wheezing rales or rhonchi Abdomen: soft nontender, nondistended, normal bowel sounds Extremities: no cyanosis, clubbing or edema noted bilaterally Neuro: Cranial nerves  II-XII intact, no focal neurological deficits    Get Medicines reviewed and adjusted: Please take all your medications with you for your next visit with your Primary MD  Please request your Primary MD to go over all hospital tests and procedure/radiological results at the follow up. Please ask your Primary MD to get all Hospital records sent to his/her office.  If you experience worsening of your admission symptoms, develop shortness of breath, life threatening emergency, suicidal or homicidal thoughts you must seek medical attention immediately by calling 911 or calling your MD immediately  if symptoms less severe.  You must read complete instructions/literature along with all the possible adverse reactions/side effects for all the Medicines you take and that have been prescribed to you. Take any new  Medicines after you have completely understood and accept all the possible adverse reactions/side effects.   Do not drive when taking pain medications.   Do not take more than prescribed Pain, Sleep and Anxiety Medications  Special Instructions: If you have smoked or chewed Tobacco  in the last 2 yrs please stop smoking, stop any regular Alcohol  and or any Recreational drug use.  Wear Seat belts while driving.  Please note  You were cared for by a hospitalist during your hospital stay. Once you are discharged, your primary care physician will handle any further medical issues. Please note that NO REFILLS for any discharge medications will be authorized once you are discharged, as it is imperative that you return to your primary care physician (or establish a relationship with a primary care physician if you do not have one) for your aftercare needs so that they can reassess your need for medications and monitor your lab values.   The results of significant diagnostics from this hospitalization (including imaging, microbiology, ancillary and laboratory) are listed below for reference.       Procedures/Studies:  DG Chest 2 View  Result Date: 11/04/2019 CLINICAL DATA:  Fall. EXAM: CHEST - 2 VIEW COMPARISON:  June 24, 2018. FINDINGS: Stable cardiomediastinal silhouette. Hiatal hernia is noted. No pneumothorax or pleural effusion is noted. Both lungs are clear. The visualized skeletal structures are unremarkable. IMPRESSION: No active cardiopulmonary disease. Hiatal hernia. Electronically Signed   By: Marijo Conception M.D.   On: 11/04/2019 13:20   DG Ribs Unilateral Left  Result Date: 11/11/2019 CLINICAL DATA:  Pain following recent fall EXAM: LEFT RIBS - 2 VIEW COMPARISON:  Chest radiograph November 05, 2019 FINDINGS: Oblique and cone-down rib images were obtained. There are fractures of the left lateral fourth, fifth, sixth, seventh, eighth, and ninth ribs. There is a small left pleural effusion with left base atelectasis. No pneumothorax evident. There have been kyphoplasty procedures at several levels in the thoracic and lumbar region. There is underlying osteoporosis. IMPRESSION: Multiple slightly displaced left-sided rib fractures without pneumothorax. Small left pleural effusion with left base atelectasis noted. Bones osteoporotic. Kyphoplasty procedures noted at multiple thoracic and upper lumbar levels. Electronically Signed   By: [name] III M.D.   On: 11/11/2019 11:10   DG Pelvis 1-2 Views  Result Date: 11/04/2019 CLINICAL DATA:  Fall EXAM: PELVIS - 1-2 VIEW COMPARISON:  None. FINDINGS: Osteopenia. There is no evidence of displaced pelvic fracture or diastasis. No pelvic bone lesions are seen. Possible bladder calculus or calcified uterine fibroid in the low left hemipelvis. IMPRESSION: Osteopenia. No acute displaced fracture or dislocation of the pelvis. Please note that plain radiographs are insensitive for hip and pelvic fracture, particularly in the setting of osteopenia. Consider CT or MRI to more sensitively evaluate for fracture if there is high clinical  suspicion. Electronically Signed   By: Eddie Candle M.D.   On: 11/04/2019 13:22   CT HEAD WO CONTRAST  Result Date: 11/05/2019 CLINICAL DATA:  Intracranial hemorrhage.  Subdural hemorrhage. EXAM: CT HEAD WITHOUT CONTRAST TECHNIQUE: Contiguous axial images were obtained from the base of the skull through the vertex without intravenous contrast. COMPARISON:  CT head without contrast 11/04/2019. FINDINGS: Brain: Hemorrhagic contusions involving the inferolateral right temporal lobe and anterior inferior right frontal lobe demonstrate expected evolution and are more evident on today's exam. Hyperdense components are more prominent with some surrounding edema. Subarachnoid hemorrhage is evident at both locations. Subdural blood adjacent to the temporal lobe is  stable. Anterior subdural hemorrhage is more visible on today's study extending to anterior falx. No new areas of hemorrhage are present. Moderate atrophy and white matter disease are again seen. The ventricles are of normal size. Vascular: Atherosclerotic calcifications are present within the cavernous internal carotid arteries bilaterally of the right vertebral artery. No hyperdense vessel is present. Skull: Calvarium is intact. Nondisplaced left occipital skull fracture is again seen. Fracture extends to the foramen magnum. No additional fractures are present. Skin staples are present over the left occipital scalp laceration. Sinuses/Orbits: The paranasal sinuses and mastoid air cells are clear. The globes and orbits are within normal limits. IMPRESSION: 1. Expected evolution of hemorrhagic contusions involving the inferolateral right temporal lobe and anterior inferior right frontal lobe. 2. Subarachnoid hemorrhage at both locations. 3. Stable subdural blood adjacent to the right temporal lobe. 4. Anterior subdural hemorrhage is more visible on today's study extending to the anterior falx. 5. Stable nondisplaced left occipital skull fracture. 6. Stable  atrophy and white matter disease. Electronically Signed   By: San Morelle M.D.   On: 11/05/2019 05:53   CT Head Wo Contrast  Result Date: 11/04/2019 CLINICAL DATA:  Head trauma, headache. Poly trauma, critical, head/cervical spine injury suspected. Additional history provided: Mechanical fall landing on back, laceration to back of head. EXAM: CT HEAD WITHOUT CONTRAST CT CERVICAL SPINE WITHOUT CONTRAST TECHNIQUE: Multidetector CT imaging of the head and cervical spine was performed following the standard protocol without intravenous contrast. Multiplanar CT image reconstructions of the cervical spine were also generated. COMPARISON:  Brain MRI 07/19/2018, head CT 07/01/2017, cervical spine MRI 05/04/2005. FINDINGS: CT HEAD FINDINGS Brain: There is acute extra-axial hemorrhage along the inferolateral right temporal lobe measuring up to 11 mm in thickness (for instance as seen on series 5, image 33 and series 3, image 8). Additionally, there is subtle hemorrhagic parenchymal contusion within the anterior right temporal lobe (series 3, image 5) (series 5, image 21). Trace acute subdural hemorrhage is also questioned along the right tentorium. Hemorrhagic parenchymal contusions within the anteroinferior right frontal lobe with overlying small volume acute subarachnoid hemorrhage (greater on the right). Stable moderate chronic small vessel ischemic changes within the cerebral white matter. Redemonstrated chronic right thalamic lacunar infarct. Stable mild generalized parenchymal atrophy. No evidence of intracranial mass. There is no hydrocephalus or midline shift. Vascular: No hyperdense vessel.  Atherosclerotic calcifications. Skull: There is a nondisplaced acute fracture within the left occipital calvarium extending inferiorly to the level of the foramen magnum. Sinuses/Orbits: Visualized orbits show no acute finding. No significant paranasal sinus disease or mastoid effusion at the imaged levels. CT  CERVICAL SPINE FINDINGS Alignment: Straightening of the expected cervical lordosis. Trace C4-C5 grade 1 anterolisthesis. There is leftward rotation of C1 upon C2, which may be related to patient head positioning at the time of examination. Skull base and vertebrae: The basion-dental and atlanto-dental intervals are maintained.No evidence of acute fracture to the cervical spine. Congenital nonunion of the posterior arch of C1. Soft tissues and spinal canal: No prevertebral fluid or swelling. No visible canal hematoma. Disc levels: Mild for age cervical spondylosis. No high-grade bony spinal canal narrowing. Upper chest: No consolidation within the imaged lung apices. No visible pneumothorax. These results were called by telephone at the time of interpretation on 11/04/2019 at 3:51 pm to provider [facility] , who verbally acknowledged these results. IMPRESSION: CT head: 1. Hemorrhagic parenchymal contusions within the anteroinferior right frontal lobe with overlying small volume acute subarachnoid hemorrhage. 2. Subtle hemorrhagic  parenchymal contusion is also present within the anterior right temporal lobe. 3. Acute extra-axial hemorrhage overlying the inferolateral right temporal lobe measuring up to 11 mm in thickness. Additionally, there is suspected trace acute subdural hemorrhage along the right tentorium. 4. Acute nondisplaced fracture of the left occipital calvarium extending inferiorly to the level of the foramen magnum. 5. Unchanged mild generalized parenchymal atrophy with moderate chronic small vessel ischemic disease. Redemonstrated chronic right thalamic lacunar infarct. CT cervical spine: 1. No evidence of acute fracture to the cervical spine. 2. Leftward rotation of C1 upon C2, which may be related to patient head positioning at the time of examination. Clinical correlation is recommended. 3. Mild for age cervical spondylosis. 4. Mild C4-C5 grade 1 anterolisthesis. Electronically Signed   By: Kellie Simmering DO   On: 11/04/2019 15:52   CT Cervical Spine Wo Contrast  Result Date: 11/04/2019 CLINICAL DATA:  Head trauma, headache. Poly trauma, critical, head/cervical spine injury suspected. Additional history provided: Mechanical fall landing on back, laceration to back of head. EXAM: CT HEAD WITHOUT CONTRAST CT CERVICAL SPINE WITHOUT CONTRAST TECHNIQUE: Multidetector CT imaging of the head and cervical spine was performed following the standard protocol without intravenous contrast. Multiplanar CT image reconstructions of the cervical spine were also generated. COMPARISON:  Brain MRI 07/19/2018, head CT 07/01/2017, cervical spine MRI 05/04/2005. FINDINGS: CT HEAD FINDINGS Brain: There is acute extra-axial hemorrhage along the inferolateral right temporal lobe measuring up to 11 mm in thickness (for instance as seen on series 5, image 33 and series 3, image 8). Additionally, there is subtle hemorrhagic parenchymal contusion within the anterior right temporal lobe (series 3, image 5) (series 5, image 21). Trace acute subdural hemorrhage is also questioned along the right tentorium. Hemorrhagic parenchymal contusions within the anteroinferior right frontal lobe with overlying small volume acute subarachnoid hemorrhage (greater on the right). Stable moderate chronic small vessel ischemic changes within the cerebral white matter. Redemonstrated chronic right thalamic lacunar infarct. Stable mild generalized parenchymal atrophy. No evidence of intracranial mass. There is no hydrocephalus or midline shift. Vascular: No hyperdense vessel.  Atherosclerotic calcifications. Skull: There is a nondisplaced acute fracture within the left occipital calvarium extending inferiorly to the level of the foramen magnum. Sinuses/Orbits: Visualized orbits show no acute finding. No significant paranasal sinus disease or mastoid effusion at the imaged levels. CT CERVICAL SPINE FINDINGS Alignment: Straightening of the expected cervical  lordosis. Trace C4-C5 grade 1 anterolisthesis. There is leftward rotation of C1 upon C2, which may be related to patient head positioning at the time of examination. Skull base and vertebrae: The basion-dental and atlanto-dental intervals are maintained.No evidence of acute fracture to the cervical spine. Congenital nonunion of the posterior arch of C1. Soft tissues and spinal canal: No prevertebral fluid or swelling. No visible canal hematoma. Disc levels: Mild for age cervical spondylosis. No high-grade bony spinal canal narrowing. Upper chest: No consolidation within the imaged lung apices. No visible pneumothorax. These results were called by telephone at the time of interpretation on 11/04/2019 at 3:51 pm to provider [facility] , who verbally acknowledged these results. IMPRESSION: CT head: 1. Hemorrhagic parenchymal contusions within the anteroinferior right frontal lobe with overlying small volume acute subarachnoid hemorrhage. 2. Subtle hemorrhagic parenchymal contusion is also present within the anterior right temporal lobe. 3. Acute extra-axial hemorrhage overlying the inferolateral right temporal lobe measuring up to 11 mm in thickness. Additionally, there is suspected trace acute subdural hemorrhage along the right tentorium. 4. Acute nondisplaced fracture of the  left occipital calvarium extending inferiorly to the level of the foramen magnum. 5. Unchanged mild generalized parenchymal atrophy with moderate chronic small vessel ischemic disease. Redemonstrated chronic right thalamic lacunar infarct. CT cervical spine: 1. No evidence of acute fracture to the cervical spine. 2. Leftward rotation of C1 upon C2, which may be related to patient head positioning at the time of examination. Clinical correlation is recommended. 3. Mild for age cervical spondylosis. 4. Mild C4-C5 grade 1 anterolisthesis. Electronically Signed   By: Kellie Simmering DO   On: 11/04/2019 15:52   CT THORACIC SPINE WO  CONTRAST  Result Date: 11/05/2019 CLINICAL DATA:  Status post fall. History of prior compression fractures and vertebral augmentation. EXAM: CT THORACIC SPINE WITHOUT CONTRAST TECHNIQUE: Multidetector CT images of the thoracic were obtained using the standard protocol without intravenous contrast. COMPARISON:  MRI thoracic spine 09/14/2019. FINDINGS: Alignment: Maintained. Vertebrae: The patient has suffered a compression fracture of T4 with vertebral body height loss of approximately 60% since the prior study. Mild bony retropulsion off the mid aspect of T4 is identified but the central canal appears open. The patient has undergone vertebral augmentation for the T9 fracture seen on the prior exam. No lytic or sclerotic lesion is identified. Bones are osteopenic. Degenerative endplate signal change anteriorly at T6-7 noted. Paraspinal and other soft tissues: Dependent atelectasis is seen. Calcific aortic and coronary atherosclerosis is noted. Calcified mediastinal and hilar lymph nodes consistent with old granulomatous disease are seen. The patient has a hiatal hernia. Disc levels: The central canal and foramina appear open at all levels. IMPRESSION: Acute or subacute T4 compression fracture with vertebral body height loss of approximately 60% is new since the comparison MRI. Mild bony retropulsion off the central aspect of T4 is noted but no central canal stenosis is seen. The fracture has an appearance most consistent with a senile osteoporotic injury. Status post T9 vertebral augmentation since the prior exam without complicating feature. Marked osteopenia. Calcific aortic and coronary atherosclerosis. Hiatal hernia. Electronically Signed   By: Inge Rise M.D.   On: 11/05/2019 13:07   CT LUMBAR SPINE WO CONTRAST  Result Date: 11/04/2019 CLINICAL DATA:  Low back and left leg pain after falling. History of spinal augmentation. EXAM: CT LUMBAR SPINE WITHOUT CONTRAST TECHNIQUE: Multidetector CT imaging of  the lumbar spine was performed without intravenous contrast administration. Multiplanar CT image reconstructions were also generated. COMPARISON:  CT lumbar spine 09/10/2007. Lumbar MRI 10/27/2016. Lumbar radiographs 09/04/2019. FINDINGS: Segmentation: There are 5 lumbar type vertebral bodies. The bones are severely demineralized. Alignment: Normal. Vertebrae: No definite acute osseous findings are seen. The L2 vertebral body has a stable appearance post spinal augmentation. Cement extension into the L2-3 disc is unchanged. There are grossly stable biconcave L3 and superior endplate compression deformities at L4. Paraspinal and other soft tissues: No acute paraspinal findings. Diffuse aortic and branch vessel atherosclerosis and hepatomegaly are noted. Disc levels: No large disc herniation or acute disc space findings are identified. Chronic osseous retropulsion at the L2 compression fracture appears stable. There is stable disc bulging, endplate osteophytes and facet hypertrophy in the lower lumbar spine, contributing to stable mild foraminal narrowing. IMPRESSION: 1. No acute osseous findings in the lumbar spine. 2. Stable chronic compression deformities at L2, L3 and L4. 3. Aortic Atherosclerosis (ICD10-I70.0). Electronically Signed   By: Richardean Sale M.D.   On: 11/04/2019 15:28   CT PELVIS WO CONTRAST  Result Date: 11/04/2019 CLINICAL DATA:  Low back and left leg pain after falling.  History of spinal augmentation. EXAM: CT PELVIS WITHOUT CONTRAST TECHNIQUE: Multidetector CT imaging of the pelvis was performed following the standard protocol without intravenous contrast. COMPARISON:  Pelvic radiographs today.  Pelvic CT 07/01/2017. FINDINGS: Urinary Tract: The visualized distal ureters and bladder appear unremarkable. Bowel: No bowel wall thickening, distention or surrounding inflammation identified within the pelvis. Mild sigmoid colon diverticular changes. Vascular/Lymphatic: No enlarged pelvic lymph  nodes identified. Diffuse aortoiliac atherosclerosis. Reproductive: The uterus and ovaries appear stable. There is a calcified anterior uterine fibroid. No adnexal mass. Other: No pelvic ascites. Musculoskeletal: There is new asymmetric enlargement of the left piriformis muscle (image 24/3), likely indicative of an acute hematoma. There is chronic atrophy of the right piriformis muscle. No other muscular abnormalities or hematomas are seen. The bones are diffusely demineralized. No evidence of acute fracture or dislocation. Stable sclerosis posteriorly in both iliac bones and stable chronic posttraumatic deformities of both pubic rami. There is also deformity of the lower sacrum on the sagittal images which appears new compared with prior CT, although demonstrates no definite acute features. IMPRESSION: 1. New asymmetric enlargement of the left piriformis muscle, likely indicative of an acute hematoma. 2. No evidence of acute fracture or dislocation. Age indeterminate lower sacral deformity, not likely acute. 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 3. Aortic Atherosclerosis (ICD10-I70.0). Electronically Signed   By: Richardean Sale M.D.   On: 11/04/2019 15:35   DG Chest Port 1 View  Result Date: 11/05/2019 CLINICAL DATA:  Acute respiratory failure. EXAM: PORTABLE CHEST 1 VIEW COMPARISON:  CT of the chest 11/04/2019 FINDINGS: Heart is mildly enlarged. Increasing asymmetric left-sided edema and pulmonary vascular congestion is present. Left basilar airspace opacity is noted. IMPRESSION: 1. Increasing asymmetric left-sided edema and pulmonary vascular congestion. 2. Left basilar airspace disease. Electronically Signed   By: San Morelle M.D.   On: 11/05/2019 07:37   DG Shoulder Left  Result Date: 11/11/2019 CLINICAL DATA:  Pain following fall EXAM: LEFT SHOULDER - 2+ VIEW COMPARISON:  July 26, 2016 left shoulder radiograph; left rib radiographs November 11, 2019 FINDINGS: Frontal, oblique,  and Y scapular images obtained. There is no appreciable fracture or dislocation in the shoulder region. Joint spaces appear unremarkable. No erosive change. There are several left-sided lateral rib fractures, described in the left rib series report. Bones osteoporotic. IMPRESSION: Several left-sided rib fractures. No fracture or dislocation in the shoulder region. No appreciable arthropathy. Bones osteoporotic. Electronically Signed   By: [name] III M.D.   On: 11/11/2019 11:13   EEG adult  Result Date: 11/05/2019 Lora Havens, MD     11/05/2019  1:17 PM Patient Name: [name] MRN: [id number] Epilepsy Attending: Lora Havens Referring Physician/Provider: Dr. 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  EEG telemetry for seizures. Level of alertness: Awake, asleep AEDs during EEG study: Keppra Technical aspects: This EEG study was done with scalp electrodes positioned according to the 10-20 International system of electrode placement. Electrical activity was acquired at a sampling rate of 500Hz  and reviewed with a high frequency filter of 70Hz  and a low frequency filter of 1Hz . EEG data were recorded continuously and digitally stored. Description: No clear posterior dominant rhythm was seen. Sleep was characterized by sleep spindles (12 to 14 Hz), maximal frontocentral region.  EEG showed continuous generalized and maximal right temporal region 3 to 6 Hz theta-delta slowing admixed with generalized 9-15 Hz alpha-beta activity.  Sharp transients were seen in the right frontal region. Hyperventilation and photic stimulation were not performed.   ABNORMALITY -Continuous slow, generalized and maximal right temporal region IMPRESSION: This study is suggestive of cortical dysfunction in right temporal region likely secondary to underlying structural abnormality as well  as mild to moderate diffuse encephalopathy, nonspecific etiology.  No seizures or definite epileptiform discharges were seen throughout the recording. Priyanka Barbra Sarks   IR VERTEBROPLASTY CERV/THOR BX INC UNI/BIL INC/INJECT/IMAGING  Result Date: 11/11/2019 INDICATION: Severe upper thoracic pain secondary to compression fracture at T4 EXAM: VERTEBROPLASTY AT T4 MEDICATIONS: Vancomycin 1 g IV prophylaxis was ordered pre-procedure and administered intravenously within 1 hour of incision. ANESTHESIA/SEDATION: General anesthesia. FLUOROSCOPY TIME:  Fluoroscopy Time: 12 minutes 0 seconds (5809 mGy) COMPLICATIONS: None immediate. TECHNIQUE: Informed written consent was obtained from the patient after a thorough discussion of the procedural risks, benefits and alternatives. All questions were addressed. Maximal Sterile Barrier Technique was utilized including caps, mask, sterile gowns, sterile gloves, sterile drape, hand hygiene and skin antiseptic. A timeout was performed prior to the initiation of the procedure. PROCEDURE: The patient was placed prone on the fluoroscopic table. Physiologic monitoring was performed throughout the duration of the procedure by anesthesiology service. The skin overlying the thoracic region was prepped and draped in the usual sterile fashion. The T4 vertebral body was identified and the right pedicle was infiltrated with 0.25% Bupivacaine. This was then followed by the advancement of a 13-gauge Cook needle through the right pedicle into the anterior one-third at T4. A gentle contrast injection demonstrated a trabecular pattern of contrast, with early opacification of epidural and paraspinous veins. This necessitated the use of Gelfoam pledgets into the 13 gauge Cook spinal needle prior to the delivery of the methylmethacrylate mixture. At this time, methylmethacrylate mixture was reconstituted. Under biplane intermittent fluoroscopy, the methylmethacrylate was then injected into the T4  vertebral body with filling of the vertebral body. No extravasation was noted into the disk spaces or posteriorly into the spinal canal. No epidural venous contamination was seen. The needle was then removed. Hemostasis was achieved at the skin entry site. The patient's general anesthesia was reversed. Upon recovery, the patient responded appropriately to some command by moving all 4 extremities. The patient conversed appropriately. She was then taken to PACU for recovery. There were no acute complications. Patient tolerated the procedure well. IMPRESSION: Status post vertebral body augmentation for painful compression fracture at T4 using vertebroplasty technique, under general anesthesia. Electronically Signed   By: Luanne Bras M.D.   On: 11/10/2019 16:56   DG Femur Min 2 Views Left  Result Date: 11/04/2019 CLINICAL DATA:  Left leg pain secondary to a fall today. EXAM: LEFT FEMUR 2 VIEWS COMPARISON:  None. FINDINGS: There is no evidence of fracture or other focal bone lesions. Left knee arthroplasty. No evidence of loosening of the components. Soft tissues are unremarkable. IMPRESSION: No acute abnormality. Electronically Signed   By: Lorriane Shire M.D.   On: 11/04/2019 13:22      LAB RESULTS: Basic Metabolic Panel: Recent Labs  Lab 11/11/19 0425 11/12/19 0318  NA 133* 132*  K 5.2* 3.7  CL 96* 96*  CO2 27 27  GLUCOSE 132* 110*  BUN 24* 24*  CREATININE 0.45 0.54  CALCIUM 9.3 8.8*  MG 2.2  --    Liver Function Tests: Recent Labs  Lab 11/06/19 0145 11/11/19 0425  AST 21 20  ALT 20 11  ALKPHOS 52 60  BILITOT 2.3* 1.6*  PROT  5.8* 5.7*  ALBUMIN 3.2* 2.7*   No results for input(s): LIPASE, AMYLASE in the last 168 hours. No results for input(s): AMMONIA in the last 168 hours. CBC: Recent Labs  Lab 11/08/19 0318 11/08/19 0318 11/11/19 0425  WBC 8.1  --  10.2  NEUTROABS 6.6  --   --   HGB 14.4  --  15.4*  HCT 46.5*  --  46.9*  MCV 96.5   < > 93.4  PLT 376  --  391    < > = values in this interval not displayed.   Cardiac Enzymes: No results for input(s): CKTOTAL, CKMB, CKMBINDEX, TROPONINI in the last 168 hours. BNP: Invalid input(s): POCBNP CBG: Recent Labs  Lab 11/05/19 1918  GLUCAP 101*       Disposition and Follow-up: Discharge Instructions    Ambulatory referral to Neurology   Complete by: As directed    An appointment is requested in approximately: 4 Week(s), I[facility] with seizure, also need hospital follow-up.,  Started on Keppra.   Diet - low sodium heart healthy   Complete by: As directed    Discharge wound care:   Complete by: As directed    Cont wound care as per instructions   Increase activity slowly   Complete by: As directed        DISPOSITION: [facility] information for follow-up providers    Eustace Moore, MD. Schedule an appointment as soon as possible for a visit in 2 week(s).   Specialty: Neurosurgery Contact information: [address] [place] [address] [phone]        Colon Branch, MD Follow up in 2 week(s).   Specialty: Internal Medicine Contact information: [address] [place] [address] [phone]            Contact information for after-discharge care    Destination    [facility]erred [facility] .   Service: Skilled Nursing Contact information: [place] [phone]                   Time coordinating discharge:  65mins   Signed:   Estill Cotta M.D. [facility] 11/12/2019, 10:50 AM

## 2019-11-18 ENCOUNTER — Other Ambulatory Visit: Payer: Self-pay | Admitting: Neurological Surgery

## 2019-11-18 DIAGNOSIS — R519 Headache, unspecified: Secondary | ICD-10-CM

## 2019-11-19 ENCOUNTER — Encounter (HOSPITAL_COMMUNITY): Payer: Self-pay

## 2019-11-19 ENCOUNTER — Other Ambulatory Visit: Payer: Self-pay | Admitting: *Deleted

## 2019-11-19 ENCOUNTER — Ambulatory Visit (HOSPITAL_COMMUNITY)
Admission: RE | Admit: 2019-11-19 | Discharge: 2019-11-19 | Disposition: A | Discharge status: Home / Self Care | Payer: Medicare Other | Source: Ambulatory Visit | Attending: Neurological Surgery | Admitting: Neurological Surgery

## 2019-11-19 ENCOUNTER — Other Ambulatory Visit: Payer: Self-pay

## 2019-11-19 DIAGNOSIS — R519 Headache, unspecified: Secondary | ICD-10-CM | POA: Insufficient documentation

## 2019-11-19 NOTE — Patient Outreach (Signed)
Screened for potential [facility] Care Management needs as a benefit of  [employer].  Mrs. Smullen is currently receiving skilled therapy at [facility].   Writer attended telephonic interdisciplinary team meeting to assess for disposition needs and transition plan for resident.   Facility reports member is having worsening headaches. Stat CT scheduled for today.   Will continue to follow for transition plans and potential [facility] Care Management re-engagement.    [name], MSN-Ed, RN,BSN [place] Acute Care Coordinator [phone] [facility]) [phone]  (Toll free office)

## 2019-11-25 DIAGNOSIS — S31819A Unspecified open wound of right buttock, initial encounter: Secondary | ICD-10-CM | POA: Diagnosis not present

## 2019-11-25 DIAGNOSIS — S31819D Unspecified open wound of right buttock, subsequent encounter: Secondary | ICD-10-CM | POA: Diagnosis not present

## 2019-11-26 ENCOUNTER — Other Ambulatory Visit: Payer: Self-pay | Admitting: *Deleted

## 2019-11-26 ENCOUNTER — Ambulatory Visit: Payer: Medicare Other

## 2019-11-26 NOTE — Patient Outreach (Signed)
Screened for potential [facility] Care Management needs as a benefit of  [employer].  Mrs. Monceaux is currently receiving skilled therapy at [facility].  Writer attended telephonic interdisciplinary team meeting to assess for disposition needs and transition plan for resident.   Facility reports member's head CT was negative. Likely transition home soon.   Will plan outreach to family to discuss transition plans.   [name], MSN-Ed, RN,BSN [place] Acute Care Coordinator [phone] [facility]) [phone]  (Toll free office)

## 2019-11-30 ENCOUNTER — Ambulatory Visit: Payer: Medicare Other | Admitting: Internal Medicine

## 2019-11-30 ENCOUNTER — Telehealth: Payer: Self-pay | Admitting: Neurology

## 2019-11-30 ENCOUNTER — Ambulatory Visit: Payer: Medicare Other | Admitting: Neurology

## 2019-11-30 NOTE — Telephone Encounter (Signed)
Patient no showed appointment

## 2019-11-30 NOTE — Progress Notes (Deleted)
[facility]    Provider:  Dr Jaynee Eagles Requesting Provider: [facility] Primary Care Provider:  Colon Branch, MD  CC:  ***  HPI:  [name] is a 86 y.o. female here as requested by Dr. Loraine Leriche for nontraumatic subcortical hemorrhage of left cerebral hemisphere. She has a past medical history of stroke and TIA, polycythemia, obstructive sleep apnea, osteoarthritis, hypertension, hyperlipidemia, migraines, depression with anxiety, cardiomyopathy, coronary artery disease. I reviewed notes from [facility] where patient was seen on November 05, 2019 after mechanical fall, she hit the back of her head and had a seizure in the emergency room, she had left head turn and behavioral arrest with mild shaking, eyes rolled back towards the back of the head, she was loaded with Keppra in the emergency room. CT of the head revealed a small right subdural hematoma as well as emergent contusions, neurosurgery did not recommend surgical interventions but conservative management. She quit smoking, no significant alcohol use. I reviewed neurology consult which showed patient was alert and responsive, she appeared slightly sleepy but alert and oriented x3, cranial nerves are intact except for questionable mild left facial droop, strength was 5 out of 5, sensory intact. Diagnosed with posttraumatic seizures in the setting of hemorrhagic contusions. Recommendations would be EEG, repeat CT and continue Keppra until followed up with outpatient neurology.  Reviewed notes, labs and imaging from outside physicians, which showed:  I reviewed EEG report which showed continuous slow, generalized and right temporal region abnormality; a cortical dysfunction in the right temporal region likely secondary to the underlying structure abnormality as well as mild to moderate diffuse encephalopathy, no seizures or definitive epileptiform discharges were seen throughout the recording.  I personally  reviewed CT of the head images and report which showed right temporal and frontal parenchymal contusions, hemorrhage, CT November 19, 2019 showed that they were nearly resolved hyperdense hemorrhage and decreased edema, the hyperdense subdural and subarachnoid hemorrhage were no longer identified, it did also show confluent areas of hypoattenuation in the supratentorial white matter nonspecific but likely chronic microvascular ischemic changes. I agree with the above.  BMP 11/12/2019: BUN 24, creat 0.54, Na 132  Review of Systems: Patient complains of symptoms per HPI as well as the following symptoms ***. Pertinent negatives and positives per HPI. All others negative.   Social History   Socioeconomic History  . Marital status: Widowed    Spouse name: Not on file  . Number of children: 3  . Years of education: Not on file  . Highest education level: Not on file  Occupational History  . Occupation: n/a  Tobacco Use  . Smoking status: Former Smoker    Packs/day: 1.00    Years: 37.00    Pack years: 37.00    Types: Cigarettes    Start date: 06/26/1949    Quit date: 05/14/1988    Years since quitting: 31.5  . Smokeless tobacco: Never Used  . Tobacco comment: quit 25 years ago  Vaping Use  . Vaping Use: Never used  Substance and Sexual Activity  . Alcohol use: Yes    Alcohol/week: 1.0 standard drink    Types: 1 Glasses of wine per week    Comment: occasional/social  . Drug use: No  . Sexual activity: Not Currently  Other Topics Concern  . Not on file  Social History Narrative   Lost husband 07/29/07-    Lives in [place] in a town house by herself, has  3 daughters, lost Mickel Baas [phone])   Early Chars lives in [place], [place] ( [place])     Still drives             Social Determinants of Health   Financial Resource Strain:   . Difficulty of Paying Living Expenses:   Food Insecurity:   . Worried About Charity fundraiser in the Last Year:   . Arboriculturist in the Last Year:     Transportation Needs:   . Film/video editor (Medical):   Marland Kitchen Lack of Transportation (Non-Medical):   Physical Activity:   . Days of Exercise per Week:   . Minutes of Exercise per Session:   Stress:   . Feeling of Stress :   Social Connections:   . Frequency of Communication with Friends and Family:   . Frequency of Social Gatherings with Friends and Family:   . Attends Religious Services:   . Active Member of Clubs or Organizations:   . Attends Archivist Meetings:   Marland Kitchen Marital Status:   Intimate Partner Violence:   . Fear of Current or Ex-Partner:   . Emotionally Abused:   Marland Kitchen Physically Abused:   . Sexually Abused:     Family History  Problem Relation Age of Onset  . Breast cancer Mother   . Heart disease Father        MIs  . Colon cancer Other        cousin  . Breast cancer Sister   . Leukemia Brother        GM    Past Medical History:  Diagnosis Date  . CAD (coronary artery disease)    mild nonobstructive disease on cath in 2003  . Cancer ([place])   . Cardiomyopathy    Probable Takotsubo, severe CP w/ normal cath in 1994. Severe CP in 2003 w/ widespread T wave inversions on ECG. Cath w/ minimal coronary disease but LV-gram showed periapical severe hypokinesis and basilar hyperkinesia (EF 40%). Last echo in 4/09 confirmed full LV functional recovery with EF 60%, no regional wall motion abnormalities, mild to moderate LVH.  Marland Kitchen Chronic diastolic CHF (congestive heart failure) ([place]) 09/13/2019  . CVA (cerebral infarction)   . Depression with anxiety 03-29-11   lost husband 3'09  . Diverticulitis   . DVT (deep venous thrombosis) ([place])    after venous ablation, R leg  . E. coli gastroenteritis 03-29-11   8'10  . Fracture 09/10/07   L2, status post vertebroplasty of L2 performed by IR  . GERD (gastroesophageal reflux disease)   . Headache(784.0)    migraines  . Hemorrhoids   . Hiatal hernia 03-29-11   no nerve problems  . Hyperlipidemia   . Hyperplastic  colon polyp 06/2001  . Hypertension   . Iron deficiency anemia due to chronic blood loss 12/30/2017  . OA (osteoarthritis) of knee 03-29-11   w/ bilateral knee pain-not a problem now  . OSA (obstructive sleep apnea) 03-29-11   no cpap used, not a problem now.  . Osteoporosis   . Otalgia of both ears    Dr. Simeon Craft  . Polycythemia   . Stroke [facility]) 03-29-11   CVA x2 -last 10'12-?TIA(visual problems)  . Varicose vein     Patient Active Problem List   Diagnosis Date Noted  . ICH (intracerebral hemorrhage) ([place]) 11/04/2019  . Intractable back pain 09/13/2019  . Compression fracture of T9 vertebra ([place]) 09/13/2019  . Chronic diastolic CHF (congestive heart failure) ([place]) 09/13/2019  .  Overweight (BMI 25.0-29.9) 09/13/2019  . Constipation 09/13/2019  . Raynaud's phenomenon without gangrene 05/19/2018  . Iron deficiency anemia due to chronic blood loss 12/30/2017  . Insomnia 06/25/2017  . Mild CAD 05/24/2017  . PCP NOTES >>> 02/15/2015  . Superficial thrombophlebitis 04/06/2014  . Annual physical exam 08/18/2012  . Edema 04/14/2012  . Breast cancer ([place]) 11/05/2011  . Polycythemia 11/05/2011  . TIA (transient ischemic attack) 01/16/2011  . CARDIOMYOPATHY 08/03/2009  . DJD -- pain mgmt 08/16/2008  . ABDOMINAL PAIN, LEFT LOWER QUADRANT 07/05/2008  . GAIT DISTURBANCE 10/21/2007  . ANXIETY DEPRESSION 10/08/2007  . Hyperlipidemia 06/25/2007  . Takotsubo cardiomyopathy 06/25/2007  . GERD without esophagitis 06/25/2007  . Essential hypertension 09/30/2006  . Senile osteoporosis 09/30/2006  . Migraine with aura 09/30/2006    Past Surgical History:  Procedure Laterality Date  . APPENDECTOMY    . BACK SURGERY    . BREAST BIOPSY    . BREAST CYST EXCISION    . BREAST LUMPECTOMY Left 2013   left stage I left breast cancer  . CHOLECYSTECTOMY  04/09/2011   Procedure: LAPAROSCOPIC CHOLECYSTECTOMY WITH INTRAOPERATIVE CHOLANGIOGRAM;  Surgeon: Odis Hollingshead, MD;  Location: [facility];   Service: General;  Laterality: N/A;  . Dental Extraction     L maxillary molar  . IR KYPHO THORACIC WITH BONE BIOPSY  09/15/2019  . IR VERTEBROPLASTY CERV/THOR BX INC UNI/BIL INC/INJECT/IMAGING  11/10/2019  . KYPHOSIS SURGERY  08/2007   cement used  . OVARIAN CYST SURGERY     left  . RADIOLOGY WITH ANESTHESIA N/A 11/10/2019   Procedure: VERTEBROPLASTY;  Surgeon: Luanne Bras, MD;  Location: [facility];  Service: Radiology;  Laterality: N/A;  . TONSILLECTOMY    . TOTAL KNEE ARTHROPLASTY  06/2010   left  . TUBAL LIGATION      Current Outpatient Medications  Medication Sig Dispense Refill  . aspirin 81 MG tablet Take 81 mg by mouth daily.    . bisacodyl (DULCOLAX) 10 MG suppository Place 1 suppository (10 mg total) rectally daily as needed for moderate constipation.    . bismuth subsalicylate (PEPTO BISMOL) 262 MG/15ML suspension Take 30 mLs by mouth daily as needed for indigestion or diarrhea or loose stools.     . docusate sodium (COLACE) 100 MG capsule Take 100 mg by mouth 2 (two) times daily.     Marland Kitchen HYDROcodone-acetaminophen (NORCO/VICODIN) 5-325 MG tablet Take 1-2 tablets by mouth every 4 (four) hours as needed for moderate pain or severe pain. 20 tablet 0  . hydroxyurea (HYDREA) 500 MG capsule Take 1 capsule (500 mg total) by mouth every other day. May take with food to minimize GI side effects. 15 capsule 0  . levETIRAcetam (KEPPRA) 500 MG tablet Take 1 tablet (500 mg total) by mouth 2 (two) times daily. 30 tablet 0  . Lidocaine 4 % PTCH LIDOCAINE 4% PATCH :Apply 1 patch topically daily and remove after 12 hours to left lateral chest 5 patch 0  . LORazepam (ATIVAN) 0.5 MG tablet TAKE 1/2 TABLET BY MOUTH EVERY MORNING AND EVERY AFTERNOON. THEN TAKE 2 TABLETS BY MOUTH AT BEDTIME AS NEEDED 10 tablet 0  . metoprolol tartrate (LOPRESSOR) 50 MG tablet Take 1 tablet (50 mg total) by mouth 2 (two) times daily. 180 tablet 1  . Nutritional Supplements (BOOST BREEZE PO) Take 1 each by mouth daily.      Marland Kitchen omeprazole (PRILOSEC) 20 MG capsule Take 1 capsule (20 mg total) by mouth daily. 30 capsule 0  .  ondansetron (ZOFRAN) 4 MG tablet Take 1 tablet (4 mg total) by mouth every 6 (six) hours as needed for nausea or vomiting. 30 tablet 0  . polyethylene glycol (MIRALAX / GLYCOLAX) 17 g packet Take 17 g by mouth daily.    . tizanidine (ZANAFLEX) 2 MG capsule Take 1 capsule (2 mg total) by mouth 3 (three) times daily as needed for muscle spasms. 14 capsule 0  . valACYclovir (VALTREX) 500 MG tablet Take 2 tablets at onset of rash and 2 tablets 12 hours later as needed. 30 tablet 1   No current facility-administered medications for this visit.    Allergies as of 11/30/2019 - Review Complete 11/19/2019  Allergen Reaction Noted  . Penicillins Hives, Rash, and Other (See Comments) 12/30/2006  . Valsartan Itching and Other (See Comments) 06/16/2008  . Atorvastatin Diarrhea 12/30/2006  . Carvedilol Other (See Comments) 07/23/2012  . Ezetimibe Nausea Only 12/30/2006  . Fluvastatin sodium Other (See Comments) 12/30/2006  . Magnesium hydroxide Other (See Comments) 02/11/2008  . Meloxicam Other (See Comments) 09/01/2013  . Pneumovax [pneumococcal polysaccharide vaccine] Other (See Comments) 08/22/2012  . Quinapril hcl Other (See Comments) 12/30/2006  . Simvastatin Diarrhea 12/30/2006  . Topamax [topiramate] Itching 04/06/2011  . Vit d-vit e-safflower oil Other (See Comments) 12/26/2012    Vitals: There were no vitals taken for this visit. Last Weight:  Wt Readings from Last 1 Encounters:  11/12/19 154 lb 8.7 oz (70.1 kg)   Last Height:   Ht Readings from Last 1 Encounters:  11/10/19 5' 5.98" (1.676 m)     Physical exam: Exam: Gen: NAD, conversant, well nourised, obese, well groomed                     CV: RRR, no MRG. No Carotid Bruits. No peripheral edema, warm, nontender Eyes: Conjunctivae clear without exudates or hemorrhage  Neuro: Detailed Neurologic Exam  Speech:    Speech is  normal; fluent and spontaneous with normal comprehension.  Cognition:    The patient is oriented to person, place, and time;     recent and remote memory intact;     language fluent;     normal attention, concentration,     fund of knowledge Cranial Nerves:    The pupils are equal, round, and reactive to light. The fundi are normal and spontaneous venous pulsations are present. Visual fields are full to finger confrontation. Extraocular movements are intact. Trigeminal sensation is intact and the muscles of mastication are normal. The face is symmetric. The palate elevates in the midline. Hearing intact. Voice is normal. Shoulder shrug is normal. The tongue has normal motion without fasciculations.   Coordination:    Normal finger to nose and heel to shin. Normal rapid alternating movements.   Gait:    Heel-toe and tandem gait are normal.   Motor Observation:    No asymmetry, no atrophy, and no involuntary movements noted. Tone:    Normal muscle tone.    Posture:    Posture is normal. normal erect    Strength:    Strength is V/V in the upper and lower limbs.      Sensation: intact to LT     Reflex Exam:  DTR's:    Deep tendon reflexes in the upper and lower extremities are normal bilaterally.   Toes:    The toes are downgoing bilaterally.   Clonus:    Clonus is absent.    Assessment/Plan:  traumatic subcortical hemorrhage of left cerebral hemisphere.  She has a past medical history of stroke and TIA, polycythemia, obstructive sleep apnea, osteoarthritis, hypertension, hyperlipidemia, migraines, depression with anxiety, cardiomyopathy, coronary artery disease. I reviewed notes from [facility] where patient was seen on November 05, 2019 after mechanical fall, she hit the back of her head and had a seizure in the emergency room.  - seizure secondary to fall and head trauma.   No orders of the defined types were placed in this encounter.  No orders of the defined types  were placed in this encounter.   Cc: Colon Branch, MD,  Colon Branch, MD  Sarina Ill, MD  [facility] [address] [place], [place] [address]  Phone [phone] Fax [phone]

## 2019-11-30 NOTE — Telephone Encounter (Signed)
Pt was no show to the new pt apt scheduled for today.

## 2019-12-02 DIAGNOSIS — S31819D Unspecified open wound of right buttock, subsequent encounter: Secondary | ICD-10-CM | POA: Diagnosis not present

## 2019-12-02 DIAGNOSIS — S31819A Unspecified open wound of right buttock, initial encounter: Secondary | ICD-10-CM | POA: Diagnosis not present

## 2019-12-03 ENCOUNTER — Inpatient Hospital Stay: Payer: Medicare Other | Attending: Hematology & Oncology

## 2019-12-03 ENCOUNTER — Inpatient Hospital Stay: Payer: Medicare Other

## 2019-12-03 ENCOUNTER — Inpatient Hospital Stay: Payer: Medicare Other | Admitting: Family

## 2019-12-04 ENCOUNTER — Telehealth: Payer: Self-pay | Admitting: Internal Medicine

## 2019-12-04 ENCOUNTER — Other Ambulatory Visit: Payer: Self-pay | Admitting: Internal Medicine

## 2019-12-04 DIAGNOSIS — F341 Dysthymic disorder: Secondary | ICD-10-CM

## 2019-12-04 MED ORDER — HYDROCODONE-ACETAMINOPHEN 5-325 MG PO TABS: 1.0000 | ORAL_TABLET | ORAL | 0 refills | Status: DC | PRN

## 2019-12-04 NOTE — Telephone Encounter (Signed)
The patient was discharged from the hospital to [facility], then transfer to [facility].   Phone call from [place] one of the staff members at [facility]:  Needs a prescription for hydrocodone to be taken as needed.  Prescription sent Needs clarification on how to take lorazepam: Half in the morning, half in the afternoon and 1 at bedtime as needed for anxiety/insomnia.  They will call at the earliest convenience to set up a post hospital visit with me.

## 2019-12-08 DIAGNOSIS — R278 Other lack of coordination: Secondary | ICD-10-CM | POA: Diagnosis not present

## 2019-12-08 DIAGNOSIS — M6281 Muscle weakness (generalized): Secondary | ICD-10-CM | POA: Diagnosis not present

## 2019-12-08 DIAGNOSIS — R262 Difficulty in walking, not elsewhere classified: Secondary | ICD-10-CM | POA: Diagnosis not present

## 2019-12-10 ENCOUNTER — Other Ambulatory Visit: Payer: Self-pay | Admitting: *Deleted

## 2019-12-10 DIAGNOSIS — R1312 Dysphagia, oropharyngeal phase: Secondary | ICD-10-CM | POA: Diagnosis not present

## 2019-12-10 DIAGNOSIS — R1310 Dysphagia, unspecified: Secondary | ICD-10-CM | POA: Diagnosis not present

## 2019-12-10 DIAGNOSIS — M6281 Muscle weakness (generalized): Secondary | ICD-10-CM | POA: Diagnosis not present

## 2019-12-10 DIAGNOSIS — Z9181 History of falling: Secondary | ICD-10-CM | POA: Diagnosis not present

## 2019-12-10 DIAGNOSIS — R4701 Aphasia: Secondary | ICD-10-CM | POA: Diagnosis not present

## 2019-12-10 DIAGNOSIS — R262 Difficulty in walking, not elsewhere classified: Secondary | ICD-10-CM | POA: Diagnosis not present

## 2019-12-10 DIAGNOSIS — R278 Other lack of coordination: Secondary | ICD-10-CM | POA: Diagnosis not present

## 2019-12-10 DIAGNOSIS — R41841 Cognitive communication deficit: Secondary | ICD-10-CM | POA: Diagnosis not present

## 2019-12-10 NOTE — Patient Outreach (Signed)
[facility] Post- Acute Care Coordinator follow up. Member screened for potential [facility] Care Management needs as a benefit of [place] [employer].  Telephone call made to daughter/DPR [name] [phone]. Patient identifiers confirmed. Early Chars confirms member transitioned to [facility] from [facility] last week. States ALF is providing all her needs.   Will sign off. No further identifiable [facility] Care Management needs.    [name], MSN-Ed, RN,BSN [place] Acute Care Coordinator [phone] [facility]) [phone]  (Toll free office)

## 2019-12-11 ENCOUNTER — Telehealth: Payer: Self-pay

## 2019-12-11 DIAGNOSIS — R4701 Aphasia: Secondary | ICD-10-CM | POA: Diagnosis not present

## 2019-12-11 DIAGNOSIS — R262 Difficulty in walking, not elsewhere classified: Secondary | ICD-10-CM

## 2019-12-11 DIAGNOSIS — R278 Other lack of coordination: Secondary | ICD-10-CM

## 2019-12-11 DIAGNOSIS — M6281 Muscle weakness (generalized): Secondary | ICD-10-CM

## 2019-12-11 DIAGNOSIS — R41841 Cognitive communication deficit: Secondary | ICD-10-CM | POA: Diagnosis not present

## 2019-12-11 DIAGNOSIS — R1312 Dysphagia, oropharyngeal phase: Secondary | ICD-10-CM | POA: Diagnosis not present

## 2019-12-11 DIAGNOSIS — R1310 Dysphagia, unspecified: Secondary | ICD-10-CM | POA: Diagnosis not present

## 2019-12-11 NOTE — Telephone Encounter (Signed)
ST plan of care signed and faxed back to [facility] at [phone]. Dr. Larose Kells recommendations below faxed to [facility] at [phone].

## 2019-12-11 NOTE — Telephone Encounter (Signed)
Admitted to the hospital on discharge on 11/12/2019 after  a mechanical fall leading to [place] and SAH.  Had a seizure.  Was treated conservatively.  Prescribed Keppra. From the hospital, went [place] [facility] ([facility]) and subsequently transferred to [facility]. We update the medication list to the best of our ability. I received note from [facility] regards patient's symptoms:  Feeling tired, diarrhea sometimes, frequent nausea. I really cannot assess those symptoms via fax.  Recommend to follow a bland diet, Zofran as needed. We already recommends  boost 1 can daily. Will need to be seen for persistent symptoms. Needs to go to the ER if: Severe symptoms, fever, chills, change in the color of the stools, blood in the stool or any other red flag symptoms.

## 2019-12-11 NOTE — Telephone Encounter (Signed)
Plan of care received from [facility]) and multiple physician orders. Orders signed and faxed back to Wellness clinic at [facility] at [phone]. Plan of care faxed back to [facility] at [phone]. Forms sent for scanning.

## 2019-12-14 ENCOUNTER — Telehealth: Payer: Self-pay | Admitting: Internal Medicine

## 2019-12-14 DIAGNOSIS — R278 Other lack of coordination: Secondary | ICD-10-CM | POA: Diagnosis not present

## 2019-12-14 DIAGNOSIS — M6281 Muscle weakness (generalized): Secondary | ICD-10-CM | POA: Diagnosis not present

## 2019-12-14 DIAGNOSIS — R1312 Dysphagia, oropharyngeal phase: Secondary | ICD-10-CM | POA: Diagnosis not present

## 2019-12-14 DIAGNOSIS — R41841 Cognitive communication deficit: Secondary | ICD-10-CM | POA: Diagnosis not present

## 2019-12-14 DIAGNOSIS — Z9181 History of falling: Secondary | ICD-10-CM | POA: Diagnosis not present

## 2019-12-14 DIAGNOSIS — R4701 Aphasia: Secondary | ICD-10-CM | POA: Diagnosis not present

## 2019-12-14 DIAGNOSIS — R262 Difficulty in walking, not elsewhere classified: Secondary | ICD-10-CM | POA: Diagnosis not present

## 2019-12-14 DIAGNOSIS — R1310 Dysphagia, unspecified: Secondary | ICD-10-CM | POA: Diagnosis not present

## 2019-12-14 MED ORDER — HYDROCODONE-ACETAMINOPHEN 5-325 MG PO TABS: 1.0000 | ORAL_TABLET | ORAL | 0 refills | Status: DC | PRN

## 2019-12-14 NOTE — Telephone Encounter (Signed)
Received 2 faxes today when we returned to the office- they were faxed over the weekend when office is closed.

## 2019-12-14 NOTE — Telephone Encounter (Signed)
Prescription sent

## 2019-12-14 NOTE — Telephone Encounter (Signed)
New Message:   1.Medication Requested: HYDROcodone-acetaminophen (NORCO/VICODIN) 5-325 MG tablet 2. Pharmacy (Name, Street, [place]): [facility], [place] 3. On Med List: Yes  4. Last Visit with PCP:   5. Next visit date with PCP:  Facility calling and states they have sent over multiple faxes and will be sending another one. Please advise.   Agent: Please be advised that RX refills may take up to 3 business days. We ask that you follow-up with your pharmacy.

## 2019-12-15 DIAGNOSIS — R262 Difficulty in walking, not elsewhere classified: Secondary | ICD-10-CM | POA: Diagnosis not present

## 2019-12-15 DIAGNOSIS — R1312 Dysphagia, oropharyngeal phase: Secondary | ICD-10-CM | POA: Diagnosis not present

## 2019-12-15 DIAGNOSIS — R41841 Cognitive communication deficit: Secondary | ICD-10-CM | POA: Diagnosis not present

## 2019-12-15 DIAGNOSIS — R1310 Dysphagia, unspecified: Secondary | ICD-10-CM | POA: Diagnosis not present

## 2019-12-15 DIAGNOSIS — R4701 Aphasia: Secondary | ICD-10-CM | POA: Diagnosis not present

## 2019-12-15 DIAGNOSIS — M6281 Muscle weakness (generalized): Secondary | ICD-10-CM | POA: Diagnosis not present

## 2019-12-16 DIAGNOSIS — R1310 Dysphagia, unspecified: Secondary | ICD-10-CM | POA: Diagnosis not present

## 2019-12-16 DIAGNOSIS — M6281 Muscle weakness (generalized): Secondary | ICD-10-CM | POA: Diagnosis not present

## 2019-12-16 DIAGNOSIS — R41841 Cognitive communication deficit: Secondary | ICD-10-CM | POA: Diagnosis not present

## 2019-12-16 DIAGNOSIS — R4701 Aphasia: Secondary | ICD-10-CM | POA: Diagnosis not present

## 2019-12-16 DIAGNOSIS — R262 Difficulty in walking, not elsewhere classified: Secondary | ICD-10-CM | POA: Diagnosis not present

## 2019-12-16 DIAGNOSIS — R1312 Dysphagia, oropharyngeal phase: Secondary | ICD-10-CM | POA: Diagnosis not present

## 2019-12-17 DIAGNOSIS — R262 Difficulty in walking, not elsewhere classified: Secondary | ICD-10-CM | POA: Diagnosis not present

## 2019-12-17 DIAGNOSIS — R4701 Aphasia: Secondary | ICD-10-CM | POA: Diagnosis not present

## 2019-12-17 DIAGNOSIS — M6281 Muscle weakness (generalized): Secondary | ICD-10-CM | POA: Diagnosis not present

## 2019-12-17 DIAGNOSIS — R1312 Dysphagia, oropharyngeal phase: Secondary | ICD-10-CM | POA: Diagnosis not present

## 2019-12-17 DIAGNOSIS — R41841 Cognitive communication deficit: Secondary | ICD-10-CM | POA: Diagnosis not present

## 2019-12-17 DIAGNOSIS — R1310 Dysphagia, unspecified: Secondary | ICD-10-CM | POA: Diagnosis not present

## 2019-12-18 DIAGNOSIS — R262 Difficulty in walking, not elsewhere classified: Secondary | ICD-10-CM | POA: Diagnosis not present

## 2019-12-18 DIAGNOSIS — M6281 Muscle weakness (generalized): Secondary | ICD-10-CM | POA: Diagnosis not present

## 2019-12-18 DIAGNOSIS — R1312 Dysphagia, oropharyngeal phase: Secondary | ICD-10-CM | POA: Diagnosis not present

## 2019-12-18 DIAGNOSIS — R4701 Aphasia: Secondary | ICD-10-CM | POA: Diagnosis not present

## 2019-12-18 DIAGNOSIS — R1310 Dysphagia, unspecified: Secondary | ICD-10-CM | POA: Diagnosis not present

## 2019-12-18 DIAGNOSIS — R41841 Cognitive communication deficit: Secondary | ICD-10-CM | POA: Diagnosis not present

## 2019-12-21 DIAGNOSIS — R41841 Cognitive communication deficit: Secondary | ICD-10-CM | POA: Diagnosis not present

## 2019-12-21 DIAGNOSIS — R4701 Aphasia: Secondary | ICD-10-CM | POA: Diagnosis not present

## 2019-12-21 DIAGNOSIS — R1310 Dysphagia, unspecified: Secondary | ICD-10-CM | POA: Diagnosis not present

## 2019-12-21 DIAGNOSIS — M6281 Muscle weakness (generalized): Secondary | ICD-10-CM | POA: Diagnosis not present

## 2019-12-21 DIAGNOSIS — R262 Difficulty in walking, not elsewhere classified: Secondary | ICD-10-CM | POA: Diagnosis not present

## 2019-12-21 DIAGNOSIS — R1312 Dysphagia, oropharyngeal phase: Secondary | ICD-10-CM | POA: Diagnosis not present

## 2019-12-22 DIAGNOSIS — R1312 Dysphagia, oropharyngeal phase: Secondary | ICD-10-CM | POA: Diagnosis not present

## 2019-12-22 DIAGNOSIS — R41841 Cognitive communication deficit: Secondary | ICD-10-CM | POA: Diagnosis not present

## 2019-12-22 DIAGNOSIS — M6281 Muscle weakness (generalized): Secondary | ICD-10-CM | POA: Diagnosis not present

## 2019-12-22 DIAGNOSIS — R1310 Dysphagia, unspecified: Secondary | ICD-10-CM | POA: Diagnosis not present

## 2019-12-22 DIAGNOSIS — R4701 Aphasia: Secondary | ICD-10-CM | POA: Diagnosis not present

## 2019-12-22 DIAGNOSIS — R262 Difficulty in walking, not elsewhere classified: Secondary | ICD-10-CM | POA: Diagnosis not present

## 2019-12-23 DIAGNOSIS — R1310 Dysphagia, unspecified: Secondary | ICD-10-CM | POA: Diagnosis not present

## 2019-12-23 DIAGNOSIS — R262 Difficulty in walking, not elsewhere classified: Secondary | ICD-10-CM | POA: Diagnosis not present

## 2019-12-23 DIAGNOSIS — M6281 Muscle weakness (generalized): Secondary | ICD-10-CM | POA: Diagnosis not present

## 2019-12-23 DIAGNOSIS — R41841 Cognitive communication deficit: Secondary | ICD-10-CM | POA: Diagnosis not present

## 2019-12-23 DIAGNOSIS — R1312 Dysphagia, oropharyngeal phase: Secondary | ICD-10-CM | POA: Diagnosis not present

## 2019-12-23 DIAGNOSIS — R4701 Aphasia: Secondary | ICD-10-CM | POA: Diagnosis not present

## 2019-12-24 ENCOUNTER — Other Ambulatory Visit: Payer: Self-pay

## 2019-12-24 ENCOUNTER — Encounter (HOSPITAL_COMMUNITY): Payer: Self-pay

## 2019-12-24 ENCOUNTER — Inpatient Hospital Stay (HOSPITAL_COMMUNITY)
Admission: EM | Admit: 2019-12-24 | Discharge: 2019-12-28 | DRG: 481 | Disposition: A | Discharge status: Skilled Nursing Facility | Payer: Medicare Other | Source: Skilled Nursing Facility | Attending: Internal Medicine | Admitting: Internal Medicine

## 2019-12-24 ENCOUNTER — Emergency Department (HOSPITAL_COMMUNITY): Payer: Medicare Other

## 2019-12-24 DIAGNOSIS — Z8673 Personal history of transient ischemic attack (TIA), and cerebral infarction without residual deficits: Secondary | ICD-10-CM | POA: Diagnosis not present

## 2019-12-24 DIAGNOSIS — W19XXXD Unspecified fall, subsequent encounter: Secondary | ICD-10-CM | POA: Diagnosis not present

## 2019-12-24 DIAGNOSIS — F419 Anxiety disorder, unspecified: Secondary | ICD-10-CM | POA: Diagnosis not present

## 2019-12-24 DIAGNOSIS — F418 Other specified anxiety disorders: Secondary | ICD-10-CM | POA: Diagnosis present

## 2019-12-24 DIAGNOSIS — R0902 Hypoxemia: Secondary | ICD-10-CM | POA: Diagnosis not present

## 2019-12-24 DIAGNOSIS — D500 Iron deficiency anemia secondary to blood loss (chronic): Secondary | ICD-10-CM | POA: Diagnosis not present

## 2019-12-24 DIAGNOSIS — K219 Gastro-esophageal reflux disease without esophagitis: Secondary | ICD-10-CM | POA: Diagnosis present

## 2019-12-24 DIAGNOSIS — M810 Age-related osteoporosis without current pathological fracture: Secondary | ICD-10-CM | POA: Diagnosis present

## 2019-12-24 DIAGNOSIS — Z7401 Bed confinement status: Secondary | ICD-10-CM | POA: Diagnosis not present

## 2019-12-24 DIAGNOSIS — Z4789 Encounter for other orthopedic aftercare: Secondary | ICD-10-CM | POA: Diagnosis not present

## 2019-12-24 DIAGNOSIS — S72002A Fracture of unspecified part of neck of left femur, initial encounter for closed fracture: Secondary | ICD-10-CM | POA: Diagnosis present

## 2019-12-24 DIAGNOSIS — G40909 Epilepsy, unspecified, not intractable, without status epilepticus: Secondary | ICD-10-CM | POA: Diagnosis present

## 2019-12-24 DIAGNOSIS — I110 Hypertensive heart disease with heart failure: Secondary | ICD-10-CM | POA: Diagnosis present

## 2019-12-24 DIAGNOSIS — R5381 Other malaise: Secondary | ICD-10-CM | POA: Diagnosis not present

## 2019-12-24 DIAGNOSIS — R296 Repeated falls: Secondary | ICD-10-CM | POA: Diagnosis present

## 2019-12-24 DIAGNOSIS — I5032 Chronic diastolic (congestive) heart failure: Secondary | ICD-10-CM | POA: Diagnosis not present

## 2019-12-24 DIAGNOSIS — Z853 Personal history of malignant neoplasm of breast: Secondary | ICD-10-CM | POA: Diagnosis not present

## 2019-12-24 DIAGNOSIS — E440 Moderate protein-calorie malnutrition: Secondary | ICD-10-CM | POA: Diagnosis present

## 2019-12-24 DIAGNOSIS — Y92099 Unspecified place in other non-institutional residence as the place of occurrence of the external cause: Secondary | ICD-10-CM

## 2019-12-24 DIAGNOSIS — E785 Hyperlipidemia, unspecified: Secondary | ICD-10-CM | POA: Diagnosis present

## 2019-12-24 DIAGNOSIS — Z87891 Personal history of nicotine dependence: Secondary | ICD-10-CM

## 2019-12-24 DIAGNOSIS — S2242XD Multiple fractures of ribs, left side, subsequent encounter for fracture with routine healing: Secondary | ICD-10-CM | POA: Diagnosis not present

## 2019-12-24 DIAGNOSIS — M2550 Pain in unspecified joint: Secondary | ICD-10-CM | POA: Diagnosis not present

## 2019-12-24 DIAGNOSIS — K5900 Constipation, unspecified: Secondary | ICD-10-CM | POA: Diagnosis not present

## 2019-12-24 DIAGNOSIS — I10 Essential (primary) hypertension: Secondary | ICD-10-CM | POA: Diagnosis not present

## 2019-12-24 DIAGNOSIS — Z8781 Personal history of (healed) traumatic fracture: Secondary | ICD-10-CM | POA: Diagnosis not present

## 2019-12-24 DIAGNOSIS — M62838 Other muscle spasm: Secondary | ICD-10-CM | POA: Diagnosis not present

## 2019-12-24 DIAGNOSIS — S72142A Displaced intertrochanteric fracture of left femur, initial encounter for closed fracture: Secondary | ICD-10-CM | POA: Diagnosis not present

## 2019-12-24 DIAGNOSIS — Z96652 Presence of left artificial knee joint: Secondary | ICD-10-CM | POA: Diagnosis present

## 2019-12-24 DIAGNOSIS — G43909 Migraine, unspecified, not intractable, without status migrainosus: Secondary | ICD-10-CM | POA: Diagnosis not present

## 2019-12-24 DIAGNOSIS — Z6825 Body mass index (BMI) 25.0-25.9, adult: Secondary | ICD-10-CM

## 2019-12-24 DIAGNOSIS — S72092B Other fracture of head and neck of left femur, initial encounter for open fracture type I or II: Secondary | ICD-10-CM | POA: Diagnosis present

## 2019-12-24 DIAGNOSIS — R112 Nausea with vomiting, unspecified: Secondary | ICD-10-CM | POA: Diagnosis not present

## 2019-12-24 DIAGNOSIS — I5181 Takotsubo syndrome: Secondary | ICD-10-CM | POA: Diagnosis present

## 2019-12-24 DIAGNOSIS — Z86718 Personal history of other venous thrombosis and embolism: Secondary | ICD-10-CM | POA: Diagnosis not present

## 2019-12-24 DIAGNOSIS — F329 Major depressive disorder, single episode, unspecified: Secondary | ICD-10-CM | POA: Diagnosis not present

## 2019-12-24 DIAGNOSIS — L89301 Pressure ulcer of unspecified buttock, stage 1: Secondary | ICD-10-CM | POA: Diagnosis present

## 2019-12-24 DIAGNOSIS — Z7982 Long term (current) use of aspirin: Secondary | ICD-10-CM | POA: Diagnosis not present

## 2019-12-24 DIAGNOSIS — W010XXA Fall on same level from slipping, tripping and stumbling without subsequent striking against object, initial encounter: Secondary | ICD-10-CM | POA: Diagnosis present

## 2019-12-24 DIAGNOSIS — D751 Secondary polycythemia: Secondary | ICD-10-CM | POA: Diagnosis not present

## 2019-12-24 DIAGNOSIS — S0990XA Unspecified injury of head, initial encounter: Secondary | ICD-10-CM | POA: Diagnosis not present

## 2019-12-24 DIAGNOSIS — Z79899 Other long term (current) drug therapy: Secondary | ICD-10-CM | POA: Diagnosis not present

## 2019-12-24 DIAGNOSIS — S22070D Wedge compression fracture of T9-T10 vertebra, subsequent encounter for fracture with routine healing: Secondary | ICD-10-CM | POA: Diagnosis not present

## 2019-12-24 DIAGNOSIS — S72355D Nondisplaced comminuted fracture of shaft of left femur, subsequent encounter for closed fracture with routine healing: Secondary | ICD-10-CM | POA: Diagnosis not present

## 2019-12-24 DIAGNOSIS — Z20822 Contact with and (suspected) exposure to covid-19: Secondary | ICD-10-CM | POA: Diagnosis present

## 2019-12-24 DIAGNOSIS — I2510 Atherosclerotic heart disease of native coronary artery without angina pectoris: Secondary | ICD-10-CM | POA: Diagnosis present

## 2019-12-24 DIAGNOSIS — I429 Cardiomyopathy, unspecified: Secondary | ICD-10-CM | POA: Diagnosis present

## 2019-12-24 DIAGNOSIS — F341 Dysthymic disorder: Secondary | ICD-10-CM | POA: Diagnosis present

## 2019-12-24 DIAGNOSIS — I619 Nontraumatic intracerebral hemorrhage, unspecified: Secondary | ICD-10-CM | POA: Diagnosis not present

## 2019-12-24 DIAGNOSIS — W19XXXA Unspecified fall, initial encounter: Secondary | ICD-10-CM | POA: Diagnosis not present

## 2019-12-24 DIAGNOSIS — S22040D Wedge compression fracture of fourth thoracic vertebra, subsequent encounter for fracture with routine healing: Secondary | ICD-10-CM | POA: Diagnosis not present

## 2019-12-24 DIAGNOSIS — R52 Pain, unspecified: Secondary | ICD-10-CM | POA: Diagnosis not present

## 2019-12-24 DIAGNOSIS — C50919 Malignant neoplasm of unspecified site of unspecified female breast: Secondary | ICD-10-CM | POA: Diagnosis not present

## 2019-12-24 DIAGNOSIS — I7300 Raynaud's syndrome without gangrene: Secondary | ICD-10-CM | POA: Diagnosis not present

## 2019-12-24 DIAGNOSIS — L8990 Pressure ulcer of unspecified site, unspecified stage: Secondary | ICD-10-CM | POA: Insufficient documentation

## 2019-12-24 DIAGNOSIS — M25552 Pain in left hip: Secondary | ICD-10-CM | POA: Diagnosis not present

## 2019-12-24 HISTORY — DX: Dyspnea, unspecified: R06.00

## 2019-12-24 LAB — CBC WITH DIFFERENTIAL/PLATELET
Abs Immature Granulocytes: 0.05 10*3/uL (ref 0.00–0.07)
Basophils Absolute: 0 10*3/uL (ref 0.0–0.1)
Basophils Relative: 0 %
Eosinophils Absolute: 0 10*3/uL (ref 0.0–0.5)
Eosinophils Relative: 0 %
HCT: 44.3 % (ref 36.0–46.0)
Hemoglobin: 14.2 g/dL (ref 12.0–15.0)
Immature Granulocytes: 1 %
Lymphocytes Relative: 3 %
Lymphs Abs: 0.3 10*3/uL — ABNORMAL LOW (ref 0.7–4.0)
MCH: 32.5 pg (ref 26.0–34.0)
MCHC: 32.1 g/dL (ref 30.0–36.0)
MCV: 101.4 fL — ABNORMAL HIGH (ref 80.0–100.0)
Monocytes Absolute: 0.7 10*3/uL (ref 0.1–1.0)
Monocytes Relative: 8 %
Neutro Abs: 7.8 10*3/uL — ABNORMAL HIGH (ref 1.7–7.7)
Neutrophils Relative %: 88 %
Platelets: 279 10*3/uL (ref 150–400)
RBC: 4.37 MIL/uL (ref 3.87–5.11)
RDW: 16 % — ABNORMAL HIGH (ref 11.5–15.5)
WBC: 8.8 10*3/uL (ref 4.0–10.5)
nRBC: 0 % (ref 0.0–0.2)

## 2019-12-24 LAB — SURGICAL PCR SCREEN
MRSA, PCR: NEGATIVE
Staphylococcus aureus: NEGATIVE

## 2019-12-24 LAB — COMPREHENSIVE METABOLIC PANEL
ALT: 16 U/L (ref 0–44)
AST: 22 U/L (ref 15–41)
Albumin: 3.3 g/dL — ABNORMAL LOW (ref 3.5–5.0)
Alkaline Phosphatase: 74 U/L (ref 38–126)
Anion gap: 9 (ref 5–15)
BUN: 9 mg/dL (ref 8–23)
CO2: 28 mmol/L (ref 22–32)
Calcium: 8.9 mg/dL (ref 8.9–10.3)
Chloride: 99 mmol/L (ref 98–111)
Creatinine, Ser: 0.42 mg/dL — ABNORMAL LOW (ref 0.44–1.00)
GFR calc Af Amer: >60 mL/min (ref >60)
GFR calc non Af Amer: >60 mL/min (ref >60)
Glucose, Bld: 162 mg/dL — ABNORMAL HIGH (ref 70–99)
Potassium: 3.9 mmol/L (ref 3.5–5.1)
Sodium: 136 mmol/L (ref 135–145)
Total Bilirubin: 1.8 mg/dL — ABNORMAL HIGH (ref 0.3–1.2)
Total Protein: 5.8 g/dL — ABNORMAL LOW (ref 6.5–8.1)

## 2019-12-24 LAB — PROTIME-INR
INR: 1.1 (ref 0.8–1.2)
Prothrombin Time: 13.5 seconds (ref 11.4–15.2)

## 2019-12-24 LAB — SARS CORONAVIRUS 2 BY RT PCR (HOSPITAL ORDER, PERFORMED IN ~~LOC~~ HOSPITAL LAB): SARS Coronavirus 2: NEGATIVE

## 2019-12-24 MED ORDER — BISACODYL 10 MG RE SUPP
10.0000 mg | Freq: Every day | RECTAL | Status: DC | PRN
  Administered 2019-12-28: 10 mg via RECTAL
  Filled 2019-12-24: qty 1

## 2019-12-24 MED ORDER — HYDROCODONE-ACETAMINOPHEN 5-325 MG PO TABS
1.0000 | ORAL_TABLET | ORAL | Status: DC | PRN
  Administered 2019-12-24 – 2019-12-25 (×2): 1 via ORAL
  Filled 2019-12-24 (×2): qty 1

## 2019-12-24 MED ORDER — METHOCARBAMOL 1000 MG/10ML IJ SOLN
500.0000 mg | Freq: Four times a day (QID) | INTRAVENOUS | Status: DC | PRN
  Filled 2019-12-24: qty 5

## 2019-12-24 MED ORDER — METOPROLOL TARTRATE 50 MG PO TABS
50.0000 mg | ORAL_TABLET | Freq: Two times a day (BID) | ORAL | Status: DC
  Administered 2019-12-24 – 2019-12-28 (×8): 50 mg via ORAL
  Filled 2019-12-24 (×8): qty 1

## 2019-12-24 MED ORDER — CHLORHEXIDINE GLUCONATE 4 % EX LIQD
60.0000 mL | Freq: Once | CUTANEOUS | Status: AC
  Administered 2019-12-25: 4 via TOPICAL

## 2019-12-24 MED ORDER — LORAZEPAM 0.5 MG PO TABS: 0.5000 mg | ORAL_TABLET | Freq: Two times a day (BID) | ORAL | Status: DC

## 2019-12-24 MED ORDER — ASPIRIN EC 81 MG PO TBEC: 81.0000 mg | DELAYED_RELEASE_TABLET | Freq: Every day | ORAL | Status: DC

## 2019-12-24 MED ORDER — OXYCODONE HCL 5 MG PO TABS
5.0000 mg | ORAL_TABLET | ORAL | Status: DC | PRN
  Administered 2019-12-24: 5 mg via ORAL
  Filled 2019-12-24: qty 1

## 2019-12-24 MED ORDER — HYDROXYUREA 500 MG PO CAPS
500.0000 mg | ORAL_CAPSULE | ORAL | Status: DC
  Administered 2019-12-25 – 2019-12-27 (×2): 500 mg via ORAL
  Filled 2019-12-24 (×2): qty 1

## 2019-12-24 MED ORDER — POLYETHYLENE GLYCOL 3350 17 G PO PACK
17.0000 g | PACK | Freq: Every day | ORAL | Status: DC
  Administered 2019-12-26 – 2019-12-28 (×3): 17 g via ORAL
  Filled 2019-12-24 (×3): qty 1

## 2019-12-24 MED ORDER — HYDROMORPHONE HCL 1 MG/ML IJ SOLN
0.5000 mg | Freq: Once | INTRAMUSCULAR | Status: AC
  Administered 2019-12-24: 0.5 mg via INTRAVENOUS
  Filled 2019-12-24: qty 1

## 2019-12-24 MED ORDER — ONDANSETRON HCL 4 MG/2ML IJ SOLN
4.0000 mg | Freq: Once | INTRAMUSCULAR | Status: AC
  Administered 2019-12-24: 4 mg via INTRAVENOUS
  Filled 2019-12-24: qty 2

## 2019-12-24 MED ORDER — LORAZEPAM 0.5 MG PO TABS
0.5000 mg | ORAL_TABLET | Freq: Every evening | ORAL | Status: DC | PRN
  Administered 2019-12-24 – 2019-12-27 (×3): 0.5 mg via ORAL
  Filled 2019-12-24 (×3): qty 1

## 2019-12-24 MED ORDER — SODIUM CHLORIDE 0.9 % IV BOLUS
1000.0000 mL | Freq: Once | INTRAVENOUS | Status: AC
  Administered 2019-12-24: 1000 mL via INTRAVENOUS

## 2019-12-24 MED ORDER — LEVETIRACETAM 500 MG PO TABS
500.0000 mg | ORAL_TABLET | Freq: Two times a day (BID) | ORAL | Status: DC
  Administered 2019-12-24 – 2019-12-28 (×8): 500 mg via ORAL
  Filled 2019-12-24 (×8): qty 1

## 2019-12-24 MED ORDER — METHOCARBAMOL 500 MG PO TABS
500.0000 mg | ORAL_TABLET | Freq: Four times a day (QID) | ORAL | Status: DC | PRN
  Administered 2019-12-24: 500 mg via ORAL
  Filled 2019-12-24: qty 1

## 2019-12-24 MED ORDER — MORPHINE SULFATE (PF) 2 MG/ML IV SOLN
0.5000 mg | INTRAVENOUS | Status: DC | PRN
  Administered 2019-12-25 (×3): 0.5 mg via INTRAVENOUS
  Filled 2019-12-24 (×3): qty 1

## 2019-12-24 MED ORDER — HYDROMORPHONE HCL 1 MG/ML IJ SOLN
1.0000 mg | Freq: Once | INTRAMUSCULAR | Status: AC
  Administered 2019-12-24: 1 mg via INTRAVENOUS
  Filled 2019-12-24: qty 1

## 2019-12-24 MED ORDER — ONDANSETRON HCL 4 MG PO TABS
4.0000 mg | ORAL_TABLET | Freq: Four times a day (QID) | ORAL | Status: DC | PRN
  Administered 2019-12-24 – 2019-12-25 (×2): 4 mg via ORAL
  Filled 2019-12-24 (×2): qty 1

## 2019-12-24 MED ORDER — LORAZEPAM 0.5 MG PO TABS
0.2500 mg | ORAL_TABLET | Freq: Two times a day (BID) | ORAL | Status: DC
  Administered 2019-12-24 – 2019-12-28 (×7): 0.25 mg via ORAL
  Filled 2019-12-24 (×7): qty 1

## 2019-12-24 MED ORDER — PANTOPRAZOLE SODIUM 40 MG PO TBEC
40.0000 mg | DELAYED_RELEASE_TABLET | Freq: Every day | ORAL | Status: DC
  Administered 2019-12-24 – 2019-12-28 (×4): 40 mg via ORAL
  Filled 2019-12-24 (×4): qty 1

## 2019-12-24 MED ORDER — SODIUM CHLORIDE 0.9 % IV SOLN: INTRAVENOUS | Status: DC

## 2019-12-24 NOTE — ED Provider Notes (Signed)
[facility] DEPT Provider Note   CSN: [id number] Arrival date & time: 12/24/19  6222     History Chief Complaint  Patient presents with  . Fall  . Hip Pain    left    [name] is a 86 y.o. female.  Patient states that she tripped and fell and hit her left hip.  She did hit her head to but no loss of consciousness.  The history is provided by the patient. No language interpreter was used.  Fall This is a new problem. The current episode started 3 to 5 hours ago. The problem occurs rarely. The problem has been resolved. Pertinent negatives include no chest pain, no abdominal pain and no headaches. Nothing aggravates the symptoms. Nothing relieves the symptoms. She has tried nothing for the symptoms. The treatment provided no relief.       Past Medical History:  Diagnosis Date  . CAD (coronary artery disease)    mild nonobstructive disease on cath in 2003  . Cancer ([place])   . Cardiomyopathy    Probable Takotsubo, severe CP w/ normal cath in 1994. Severe CP in 2003 w/ widespread T wave inversions on ECG. Cath w/ minimal coronary disease but LV-gram showed periapical severe hypokinesis and basilar hyperkinesia (EF 40%). Last echo in 4/09 confirmed full LV functional recovery with EF 60%, no regional wall motion abnormalities, mild to moderate LVH.  Marland Kitchen Chronic diastolic CHF (congestive heart failure) ([place]) 09/13/2019  . CVA (cerebral infarction)   . Depression with anxiety 03-29-11   lost husband 3'09  . Diverticulitis   . DVT (deep venous thrombosis) ([place])    after venous ablation, R leg  . E. coli gastroenteritis 03-29-11   8'10  . Fracture 09/10/07   L2, status post vertebroplasty of L2 performed by IR  . GERD (gastroesophageal reflux disease)   . Headache(784.0)    migraines  . Hemorrhoids   . Hiatal hernia 03-29-11   no nerve problems  . Hyperlipidemia   . Hyperplastic colon polyp 06/2001  . Hypertension   . Iron deficiency  anemia due to chronic blood loss 12/30/2017  . OA (osteoarthritis) of knee 03-29-11   w/ bilateral knee pain-not a problem now  . OSA (obstructive sleep apnea) 03-29-11   no cpap used, not a problem now.  . Osteoporosis   . Otalgia of both ears    Dr. Simeon Craft  . Polycythemia   . Stroke [facility]) 03-29-11   CVA x2 -last 10'12-?TIA(visual problems)  . Varicose vein     Patient Active Problem List   Diagnosis Date Noted  . Comminuted fracture of hip, left, open type I or II, initial encounter ([place]) 12/24/2019  . ICH (intracerebral hemorrhage) ([place]) 11/04/2019  . Intractable back pain 09/13/2019  . Compression fracture of T9 vertebra ([place]) 09/13/2019  . Chronic diastolic CHF (congestive heart failure) ([place]) 09/13/2019  . Overweight (BMI 25.0-29.9) 09/13/2019  . Constipation 09/13/2019  . Raynaud's phenomenon without gangrene 05/19/2018  . Iron deficiency anemia due to chronic blood loss 12/30/2017  . Insomnia 06/25/2017  . Mild CAD 05/24/2017  . PCP NOTES >>> 02/15/2015  . Superficial thrombophlebitis 04/06/2014  . Annual physical exam 08/18/2012  . Edema 04/14/2012  . Breast cancer ([place]) 11/05/2011  . Polycythemia 11/05/2011  . TIA (transient ischemic attack) 01/16/2011  . CARDIOMYOPATHY 08/03/2009  . DJD -- pain mgmt 08/16/2008  . ABDOMINAL PAIN, LEFT LOWER QUADRANT 07/05/2008  . GAIT DISTURBANCE 10/21/2007  . ANXIETY DEPRESSION 10/08/2007  .  Hyperlipidemia 06/25/2007  . Takotsubo cardiomyopathy 06/25/2007  . GERD without esophagitis 06/25/2007  . Essential hypertension 09/30/2006  . Senile osteoporosis 09/30/2006  . Migraine with aura 09/30/2006    Past Surgical History:  Procedure Laterality Date  . APPENDECTOMY    . BACK SURGERY    . BREAST BIOPSY    . BREAST CYST EXCISION    . BREAST LUMPECTOMY Left 2013   left stage I left breast cancer  . CHOLECYSTECTOMY  04/09/2011   Procedure: LAPAROSCOPIC CHOLECYSTECTOMY WITH INTRAOPERATIVE CHOLANGIOGRAM;  Surgeon: Odis Hollingshead, MD;  Location: [facility];  Service: General;  Laterality: N/A;  . Dental Extraction     L maxillary molar  . IR KYPHO THORACIC WITH BONE BIOPSY  09/15/2019  . IR VERTEBROPLASTY CERV/THOR BX INC UNI/BIL INC/INJECT/IMAGING  11/10/2019  . KYPHOSIS SURGERY  08/2007   cement used  . OVARIAN CYST SURGERY     left  . RADIOLOGY WITH ANESTHESIA N/A 11/10/2019   Procedure: VERTEBROPLASTY;  Surgeon: Luanne Bras, MD;  Location: [facility];  Service: Radiology;  Laterality: N/A;  . TONSILLECTOMY    . TOTAL KNEE ARTHROPLASTY  06/2010   left  . TUBAL LIGATION       OB History    Gravida  3   Para  3   Term      Preterm      AB      Living  3     SAB      TAB      Ectopic      Multiple      Live Births              Family History  Problem Relation Age of Onset  . Breast cancer Mother   . Heart disease Father        MIs  . Colon cancer Other        cousin  . Breast cancer Sister   . Leukemia Brother        GM    Social History   Tobacco Use  . Smoking status: Former Smoker    Packs/day: 1.00    Years: 37.00    Pack years: 37.00    Types: Cigarettes    Start date: 06/26/1949    Quit date: 05/14/1988    Years since quitting: 31.6  . Smokeless tobacco: Never Used  . Tobacco comment: quit 25 years ago  Vaping Use  . Vaping Use: Never used  Substance Use Topics  . Alcohol use: Yes    Alcohol/week: 1.0 standard drink    Types: 1 Glasses of wine per week    Comment: occasional/social  . Drug use: No    Home Medications Prior to Admission medications   Medication Sig Start Date End Date Taking? Authorizing Provider  aspirin 81 MG tablet Take 81 mg by mouth daily.   Yes [provider]  bismuth subsalicylate (PEPTO BISMOL) 262 MG/15ML suspension Take 30 mLs by mouth daily as needed for indigestion or diarrhea or loose stools.    Yes [provider]  docusate sodium (COLACE) 100 MG capsule Take 100 mg by mouth 2 (two) times daily.    Yes  [provider]  HYDROcodone-acetaminophen (NORCO/VICODIN) 5-325 MG tablet Take 1 tablet by mouth every 4 (four) hours as needed for moderate pain or severe pain. 12/14/19  Yes Paz, Alda Berthold, MD  hydroxyurea (HYDREA) 500 MG capsule Take 1 capsule (500 mg total) by mouth every other day. May  take with food to minimize GI side effects. 10/05/19  Yes Lassen, Arlo C, PA-C  levETIRAcetam (KEPPRA) 500 MG tablet Take 1 tablet (500 mg total) by mouth 2 (two) times daily. 11/12/19  Yes Rai, Ripudeep K, MD  bisacodyl (DULCOLAX) 10 MG suppository Place 1 suppository (10 mg total) rectally daily as needed for moderate constipation. 09/17/19   [name], Corrie Mckusick, MD  Infant Care Products [facility]) CREA Apply to buttocks topically every shift for rash 12/11/19   Colon Branch, MD  Lidocaine 4 % PTCH LIDOCAINE 4% PATCH :Apply 1 patch topically daily and remove after 12 hours to left lateral chest 11/12/19   Rai, Ripudeep K, MD  LORazepam (ATIVAN) 0.5 MG tablet TAKE 1/2 TABLET BY MOUTH EVERY MORNING AND EVERY AFTERNOON. THEN TAKE 1 TABLET BY MOUTH AT BEDTIME AS NEEDED 12/04/19   Colon Branch, MD  metoprolol tartrate (LOPRESSOR) 50 MG tablet Take 1 tablet (50 mg total) by mouth 2 (two) times daily. 10/09/19   Colon Branch, MD  Nutritional Supplements (BOOST BREEZE PO) Take 1 each by mouth daily.     [provider]  omeprazole (PRILOSEC) 20 MG capsule Take 1 capsule (20 mg total) by mouth daily. 10/05/19   [place] Lewis C, PA-C  ondansetron (ZOFRAN) 4 MG tablet Take 1 tablet (4 mg total) by mouth every 6 (six) hours as needed for nausea or vomiting. 10/05/19   [place] Lewis C, PA-C  polyethylene glycol (MIRALAX / GLYCOLAX) 17 g packet Take 17 g by mouth daily.    [provider]  tizanidine (ZANAFLEX) 2 MG capsule Take 1 capsule (2 mg total) by mouth 3 (three) times daily as needed for muscle spasms. 10/05/19   Wille Celeste, PA-C  valACYclovir (VALTREX) 500 MG tablet Take 2 tablets at onset of rash and 2 tablets  12 hours later as needed. 02/16/19   [place], Holli Humbles, NP    Allergies    Penicillins, Valsartan, Atorvastatin, Carvedilol, Ezetimibe, Fluvastatin sodium, Magnesium hydroxide, Meloxicam, Pneumovax [pneumococcal polysaccharide vaccine], Quinapril hcl, Simvastatin, Topamax [topiramate], and Vit d-vit e-safflower oil  Review of Systems   Review of Systems  Constitutional: Negative for appetite change and fatigue.  HENT: Negative for congestion, ear discharge and sinus pressure.   Eyes: Negative for discharge.  Respiratory: Negative for cough.   Cardiovascular: Negative for chest pain.  Gastrointestinal: Negative for abdominal pain and diarrhea.  Genitourinary: Negative for frequency and hematuria.  Musculoskeletal: Negative for back pain.       Left hip pain  Skin: Negative for rash.  Neurological: Negative for seizures and headaches.  Psychiatric/Behavioral: Negative for hallucinations.    Physical Exam Updated Vital Signs BP (!) 141/72 (BP Location: Right Arm)   Pulse 87   Temp 97.9 F (36.6 C) (Oral)   Resp 16   Ht 5\' 5"  (1.651 m)   Wt 70 kg   SpO2 96%   BMI 25.68 kg/m   Physical Exam Vitals and nursing note reviewed.  Constitutional:      Appearance: She is well-developed.  HENT:     Head: Normocephalic.     Nose: Nose normal.  Eyes:     General: No scleral icterus.    Conjunctiva/sclera: Conjunctivae normal.  Neck:     Thyroid: No thyromegaly.  Cardiovascular:     Rate and Rhythm: Normal rate and regular rhythm.     Heart sounds: No murmur heard.  No friction rub. No gallop.   Pulmonary:     Breath sounds: No  stridor. No wheezing or rales.  Chest:     Chest wall: No tenderness.  Abdominal:     General: There is no distension.     Tenderness: There is no abdominal tenderness. There is no rebound.  Musculoskeletal:     Cervical back: Neck supple.     Comments: Tender deformed left hip  Lymphadenopathy:     Cervical: No cervical adenopathy.  Skin:     Findings: No erythema or rash.  Neurological:     Mental Status: She is alert and oriented to person, place, and time.     Motor: No abnormal muscle tone.     Coordination: Coordination normal.  Psychiatric:        Behavior: Behavior normal.     ED Results / Procedures / Treatments   Labs (all labs ordered are listed, but only abnormal results are displayed) Labs Reviewed  CBC WITH DIFFERENTIAL/PLATELET - Abnormal; Notable for the following components:      Result Value   MCV 101.4 (*)    RDW 16.0 (*)    Neutro Abs 7.8 (*)    Lymphs Abs 0.3 (*)    All other components within normal limits  COMPREHENSIVE METABOLIC PANEL - Abnormal; Notable for the following components:   Glucose, Bld 162 (*)    Creatinine, Ser 0.42 (*)    Total Protein 5.8 (*)    Albumin 3.3 (*)    Total Bilirubin 1.8 (*)    All other components within normal limits  SARS CORONAVIRUS 2 BY RT PCR (HOSPITAL ORDER, [facility])    EKG None  Radiology CT Head Wo Contrast  Result Date: 12/24/2019 CLINICAL DATA:  Head trauma, minor. Additional history provided: Unwitnessed fall. EXAM: CT HEAD WITHOUT CONTRAST TECHNIQUE: Contiguous axial images were obtained from the base of the skull through the vertex without intravenous contrast. COMPARISON:  Prior head CT 11/19/2019. FINDINGS: Brain: Stable, mild generalized parenchymal atrophy. Redemonstrated encephalomalacia within the right temporal lobe and anteroinferior right frontal lobe at site of prior contusions. Redemonstrated chronic infarcts within the right thalamus and left cerebellum. Stable background moderate/advanced patchy and confluent hypoattenuation within the cerebral white matter which is nonspecific, but consistent with chronic small vessel ischemic disease. There is no acute intracranial hemorrhage. No acute demarcated cortical infarct is identified. No extra-axial fluid collection. No evidence of intracranial mass. No midline  shift. Vascular: No hyperdense vessel.  Atherosclerotic calcifications. Skull: Redemonstrated subacute nondepressed left parietooccipital calvarial fracture. No interval calvarial fracture is identified. Sinuses/Orbits: Visualized orbits show no acute finding. Minimal ethmoid sinus mucosal thickening. No significant mastoid effusion. IMPRESSION: No CT evidence of acute intracranial abnormality. Encephalomalacia at sites of prior right temporal and frontal lobe parenchymal contusions. Redemonstrated subacute nondepressed left parietooccipital calvarial fracture. Stable generalized parenchymal atrophy and chronic ischemic changes as described. Minimal ethmoid sinus mucosal thickening. Electronically Signed   By: Kellie Simmering DO   On: 12/24/2019 09:00   DG Hip Unilat W or Wo Pelvis 2-3 Views Left  Result Date: 12/24/2019 CLINICAL DATA:  Status post fall.  Left hip pain and deformity. EXAM: DG HIP (WITH OR WITHOUT PELVIS) 2-3V LEFT COMPARISON:  11/04/2019. FINDINGS: There is an acute, comminuted, impacted intertrochanteric fracture involving the proximal left femur. Moderate medial angulation of the distal fracture fragments. No dislocation identified. IMPRESSION: Acute, comminuted, impacted intertrochanteric fracture of the proximal left femur. Electronically Signed   By: Kerby Moors M.D.   On: 12/24/2019 09:08    Procedures Procedures (including critical  care time)  Medications Ordered in ED Medications  HYDROmorphone (DILAUDID) injection 0.5 mg (0.5 mg Intravenous Given 12/24/19 0825)  HYDROmorphone (DILAUDID) injection 1 mg (1 mg Intravenous Given 12/24/19 1117)  sodium chloride 0.9 % bolus 1,000 mL (1,000 mLs Intravenous New Bag/Given 12/24/19 1357)  ondansetron (ZOFRAN) injection 4 mg (4 mg Intravenous Given 12/24/19 1433)  HYDROmorphone (DILAUDID) injection 0.5 mg (0.5 mg Intravenous Given 12/24/19 1433)    ED Course  I have reviewed the triage vital signs and the nursing notes.  Pertinent  labs & imaging results that were available during my care of the patient were reviewed by me and considered in my medical decision making (see chart for details).    MDM Rules/Calculators/A&P        Patient with a fractured left hip.  Dr. Alvan Dame the orthopedic physician will repair her hip tomorrow.  She will be admitted to medicine       This patient presents to the ED for concern of left hip pain, this involves an extensive number of treatment options, and is a complaint that carries with it a high risk of complications and morbidity.  The differential diagnosis includes fractured hip   Lab Tests:   I Ordered, reviewed, and interpreted labs, which included CBC and chemistries which are unremarkable  Medicines ordered:   I ordered medication Dilaudid for pain  Imaging Studies ordered:   I ordered imaging studies which included left hip x-ray  I independently visualized and interpreted imaging which showed an atrophic fracture  Additional history obtained:   Additional history obtained from records and daughter  Previous records obtained and reviewed.  Consultations Obtained:   I consulted hospitalist and orthopedic and discussed lab and imaging findings  Reevaluation:  After the interventions stated above, I reevaluated the patient and found mild improvement with  Critical Interventions:  .               Final Clinical Impression(s) / ED Diagnoses Final diagnoses:  Closed fracture of left hip, initial encounter [facility])    Rx / [place] Orders ED Discharge Orders    None       Milton Ferguson, MD 12/24/19 1517

## 2019-12-24 NOTE — Plan of Care (Signed)
Continuing to monitor patient level of anxiety and help to decrease it. Deanna Artis, RN

## 2019-12-24 NOTE — ED Triage Notes (Signed)
Patient in from [facility] after suffering an unwitnessed fall, EMS states patient was attempting to ambulate to bathroom and began to urinate on self and slipped and fell on left side, obvious hip deformity per ems, Denies hitting head. Given 139mcg FENT in route.

## 2019-12-24 NOTE — ED Notes (Signed)
Daughter, [name] [phone] for updates

## 2019-12-24 NOTE — ED Notes (Signed)
Granddaughter Nonah Mattes [phone]

## 2019-12-24 NOTE — Consult Note (Signed)
Reason for Consult: left hip fracture Referring Physician: Aileen Fass, MD  [name] is an 86 y.o. female.  HPI: [name] is a 86 y.o. female past medical history of coronary artery disease Takotsubo cardiomyopathy in 2458, chronic diastolic heart failure CVA and hypertension who was recently discharged from the hospital on 11/12/2019 for an intracranial hemorrhage and acute and cephalopathy due to mechanical fall comes in again for a fall that happened on the day of admission she relates she slipped and fell on her left hip she did not hit her head, she denies any chest pain shortness of breath nausea vomiting lightheadedness double vision or change in vision she did develop sudden left-sided pain related with movement.   Past Medical History:  Diagnosis Date  . CAD (coronary artery disease)    mild nonobstructive disease on cath in 2003  . Cancer ([place])   . Cardiomyopathy    Probable Takotsubo, severe CP w/ normal cath in 1994. Severe CP in 2003 w/ widespread T wave inversions on ECG. Cath w/ minimal coronary disease but LV-gram showed periapical severe hypokinesis and basilar hyperkinesia (EF 40%). Last echo in 4/09 confirmed full LV functional recovery with EF 60%, no regional wall motion abnormalities, mild to moderate LVH.  Marland Kitchen Chronic diastolic CHF (congestive heart failure) ([place]) 09/13/2019  . CVA (cerebral infarction)   . Depression with anxiety 03-29-11   lost husband 3'09  . Diverticulitis   . DVT (deep venous thrombosis) ([place])    after venous ablation, R leg  . E. coli gastroenteritis 03-29-11   8'10  . Fracture 09/10/07   L2, status post vertebroplasty of L2 performed by IR  . GERD (gastroesophageal reflux disease)   . Headache(784.0)    migraines  . Hemorrhoids   . Hiatal hernia 03-29-11   no nerve problems  . Hyperlipidemia   . Hyperplastic colon polyp 06/2001  . Hypertension   . Iron deficiency anemia due to chronic blood loss 12/30/2017  . OA  (osteoarthritis) of knee 03-29-11   w/ bilateral knee pain-not a problem now  . OSA (obstructive sleep apnea) 03-29-11   no cpap used, not a problem now.  . Osteoporosis   . Otalgia of both ears    Dr. Simeon Craft  . Polycythemia   . Stroke [facility]) 03-29-11   CVA x2 -last 10'12-?TIA(visual problems)  . Varicose vein     Past Surgical History:  Procedure Laterality Date  . APPENDECTOMY    . BACK SURGERY    . BREAST BIOPSY    . BREAST CYST EXCISION    . BREAST LUMPECTOMY Left 2013   left stage I left breast cancer  . CHOLECYSTECTOMY  04/09/2011   Procedure: LAPAROSCOPIC CHOLECYSTECTOMY WITH INTRAOPERATIVE CHOLANGIOGRAM;  Surgeon: Odis Hollingshead, MD;  Location: [facility];  Service: General;  Laterality: N/A;  . Dental Extraction     L maxillary molar  . IR KYPHO THORACIC WITH BONE BIOPSY  09/15/2019  . IR VERTEBROPLASTY CERV/THOR BX INC UNI/BIL INC/INJECT/IMAGING  11/10/2019  . KYPHOSIS SURGERY  08/2007   cement used  . OVARIAN CYST SURGERY     left  . RADIOLOGY WITH ANESTHESIA N/A 11/10/2019   Procedure: VERTEBROPLASTY;  Surgeon: Luanne Bras, MD;  Location: [place];  Service: Radiology;  Laterality: N/A;  . TONSILLECTOMY    . TOTAL KNEE ARTHROPLASTY  06/2010   left  . TUBAL LIGATION      Family History  Problem Relation Age of Onset  . Breast cancer Mother   .  Heart disease Father        MIs  . Colon cancer Other        cousin  . Breast cancer Sister   . Leukemia Brother        GM    Social History:  reports that she quit smoking about 31 years ago. Her smoking use included cigarettes. She started smoking about 68 years ago. She has a 37.00 pack-year smoking history. She has never used smokeless tobacco. She reports current alcohol use of about 1.0 standard drink of alcohol per week. She reports that she does not use drugs.  Allergies:  Allergies  Allergen Reactions  . Penicillins Hives, Rash and Other (See Comments)    Has patient had a PCN reaction causing immediate  rash, facial/tongue/throat swelling, SOB or lightheadedness with hypotension: Yes Has patient had a PCN reaction causing severe rash involving mucus membranes or skin necrosis: yes Has patient had a PCN reaction that required hospitalization: No Has patient had a PCN reaction occurring within the last 10 years: no If all of the above answers are "NO", then may proceed with Cephalosporin use.  Other Reaction: OTHER REACTION  . Valsartan Itching and Other (See Comments)  . Atorvastatin Diarrhea  . Carvedilol Other (See Comments)    "teeth hurt when I took it"   . Ezetimibe Nausea Only  . Fluvastatin Sodium Other (See Comments)    "Can't remember"  . Magnesium Hydroxide Other (See Comments)    REACTION: triggers HAs  . Meloxicam Other (See Comments)    Pt seeing auro's / spots.    . Pneumovax [Pneumococcal Polysaccharide Vaccine] Other (See Comments)    Local reaction  . Quinapril Hcl Other (See Comments)     "feelings of tiredness"  . Simvastatin Diarrhea  . Topamax [Topiramate] Itching  . Vit D-Vit E-Safflower Oil Other (See Comments)    Headaches    Medications:  I have reviewed the patient's current medications. Scheduled: . aspirin EC  81 mg Oral Daily  . [START ON 12/25/2019] hydroxyurea  500 mg Oral QODAY  . levETIRAcetam  500 mg Oral BID  . LORazepam  0.25 mg Oral BID WC  . metoprolol tartrate  50 mg Oral BID  . pantoprazole  40 mg Oral Daily  . [START ON 12/25/2019] polyethylene glycol  17 g Oral Daily    Results for orders placed or performed during the hospital encounter of 12/24/19 (from the past 24 hour(s))  CBC with Differential/Platelet     Status: Abnormal   Collection Time: 12/24/19  8:13 AM  Result Value Ref Range   WBC 8.8 4.0 - 10.5 K/uL   RBC 4.37 3.87 - 5.11 MIL/uL   Hemoglobin 14.2 12.0 - 15.0 g/dL   HCT 44.3 36 - 46 %   MCV 101.4 (H) 80.0 - 100.0 fL   MCH 32.5 26.0 - 34.0 pg   MCHC 32.1 30.0 - 36.0 g/dL   RDW 16.0 (H) 11.5 - 15.5 %   Platelets  279 150 - 400 K/uL   nRBC 0.0 0.0 - 0.2 %   Neutrophils Relative % 88 %   Neutro Abs 7.8 (H) 1.7 - 7.7 K/uL   Lymphocytes Relative 3 %   Lymphs Abs 0.3 (L) 0.7 - 4.0 K/uL   Monocytes Relative 8 %   Monocytes Absolute 0.7 0 - 1 K/uL   Eosinophils Relative 0 %   Eosinophils Absolute 0.0 0 - 0 K/uL   Basophils Relative 0 %   Basophils Absolute  0.0 0 - 0 K/uL   Immature Granulocytes 1 %   Abs Immature Granulocytes 0.05 0.00 - 0.07 K/uL  Comprehensive metabolic panel     Status: Abnormal   Collection Time: 12/24/19  8:13 AM  Result Value Ref Range   Sodium 136 135 - 145 mmol/L   Potassium 3.9 3.5 - 5.1 mmol/L   Chloride 99 98 - 111 mmol/L   CO2 28 22 - 32 mmol/L   Glucose, Bld 162 (H) 70 - 99 mg/dL   BUN 9 8 - 23 mg/dL   Creatinine, Ser 0.42 (L) 0.44 - 1.00 mg/dL   Calcium 8.9 8.9 - 10.3 mg/dL   Total Protein 5.8 (L) 6.5 - 8.1 g/dL   Albumin 3.3 (L) 3.5 - 5.0 g/dL   AST 22 15 - 41 U/L   ALT 16 0 - 44 U/L   Alkaline Phosphatase 74 38 - 126 U/L   Total Bilirubin 1.8 (H) 0.3 - 1.2 mg/dL   GFR calc non Af Amer >60 >60 mL/min   GFR calc Af Amer >60 >60 mL/min   Anion gap 9 5 - 15  SARS Coronavirus 2 by RT PCR (hospital order, performed in [place] hospital lab) Nasopharyngeal Nasopharyngeal Swab     Status: None   Collection Time: 12/24/19  8:14 AM   Specimen: Nasopharyngeal Swab  Result Value Ref Range   SARS Coronavirus 2 NEGATIVE NEGATIVE     X-ray: CLINICAL DATA:  Status post fall.  Left hip pain and deformity.  EXAM: DG HIP (WITH OR WITHOUT PELVIS) 2-3V LEFT  COMPARISON:  11/04/2019.  FINDINGS: There is an acute, comminuted, impacted intertrochanteric fracture involving the proximal left femur. Moderate medial angulation of the distal fracture fragments. No dislocation identified.  IMPRESSION: Acute, comminuted, impacted intertrochanteric fracture of the proximal left femur.   Electronically Signed   By: Kerby Moors M.D.  ROS : All systems  reviewed and apart from history of presenting illness, are negative.   Blood pressure 139/70, pulse 100, temperature 98.4 F (36.9 C), temperature source Axillary, resp. rate 18, height 5\' 5"  (1.651 m), weight 70 kg, SpO2 94 %.  Physical Exam :  General exam: Moderately built and nourished patient, lying comfortably supine on the gurney in no obvious distress.  Head, eyes and ENT: Nontraumatic and normocephalic.   Neck: Supple. No JVD, carotid bruit or thyromegaly.  Lymphatics: No lymphadenopathy.  Respiratory system: Good air movement and clear to auscultation.  Cardiovascular system: S1 and S2 heard, RRR. No JVD, murmurs, gallops, clicks or pedal edema.  Gastrointestinal system: Abdomen is nondistended, soft and nontender. Normal bowel sounds heard. No organomegaly or masses appreciated.  Central nervous system: Alert and oriented.   Extremities: LLE shortened and externally rotated  Skin: No rashes or acute findings.    Assessment/Plan: 1. Left hip/intertrochanteric fracture  Plan: Admitted to [place] for ORIF tomorrow around 3:30 NPO after MN Consent ordered  Mauri Pole 12/24/2019, 5:02 PM

## 2019-12-24 NOTE — H&P (Signed)
History and Physical  [name] UJW:[id number] DOB: 05-10-1930 DOA: 12/24/2019  PCP: Colon Branch, MD Patient coming from: ALF  I have personally briefly reviewed patient's old medical records in [place]   Chief Complaint: fall  HPI: [name] is a 86 y.o. female past medical history of coronary artery disease Takotsubo cardiomyopathy in 5621, chronic diastolic heart failure CVA and hypertension who was recently discharged from the hospital on 11/12/2019 for an intracranial hemorrhage and acute and cephalopathy due to mechanical fall comes in again for a fall that happened on the day of admission she relates she slipped and fell on her left hip she did not hit her head, she denies any chest pain shortness of breath nausea vomiting lightheadedness double vision or change in vision he did develop sudden left-sided pain related with movement.  In the ED: Vital signs are stable, markable CT scan of the head showed no acute findings left hip x-ray showed acute comminuted impacted enteroenteric left femur fracture  Review of Systems: All systems reviewed and apart from history of presenting illness, are negative.  Past Medical History:  Diagnosis Date  . CAD (coronary artery disease)    mild nonobstructive disease on cath in 2003  . Cancer ([place])   . Cardiomyopathy    Probable Takotsubo, severe CP w/ normal cath in 1994. Severe CP in 2003 w/ widespread T wave inversions on ECG. Cath w/ minimal coronary disease but LV-gram showed periapical severe hypokinesis and basilar hyperkinesia (EF 40%). Last echo in 4/09 confirmed full LV functional recovery with EF 60%, no regional wall motion abnormalities, mild to moderate LVH.  Marland Kitchen Chronic diastolic CHF (congestive heart failure) ([place]) 09/13/2019  . CVA (cerebral infarction)   . Depression with anxiety 03-29-11   lost husband 3'09  . Diverticulitis   . DVT (deep venous thrombosis) ([place])    after venous ablation, R leg    . E. coli gastroenteritis 03-29-11   8'10  . Fracture 09/10/07   L2, status post vertebroplasty of L2 performed by IR  . GERD (gastroesophageal reflux disease)   . Headache(784.0)    migraines  . Hemorrhoids   . Hiatal hernia 03-29-11   no nerve problems  . Hyperlipidemia   . Hyperplastic colon polyp 06/2001  . Hypertension   . Iron deficiency anemia due to chronic blood loss 12/30/2017  . OA (osteoarthritis) of knee 03-29-11   w/ bilateral knee pain-not a problem now  . OSA (obstructive sleep apnea) 03-29-11   no cpap used, not a problem now.  . Osteoporosis   . Otalgia of both ears    Dr. Simeon Craft  . Polycythemia   . Stroke [facility]) 03-29-11   CVA x2 -last 10'12-?TIA(visual problems)  . Varicose vein    Past Surgical History:  Procedure Laterality Date  . APPENDECTOMY    . BACK SURGERY    . BREAST BIOPSY    . BREAST CYST EXCISION    . BREAST LUMPECTOMY Left 2013   left stage I left breast cancer  . CHOLECYSTECTOMY  04/09/2011   Procedure: LAPAROSCOPIC CHOLECYSTECTOMY WITH INTRAOPERATIVE CHOLANGIOGRAM;  Surgeon: Odis Hollingshead, MD;  Location[facility];  Service: General;  Laterality: N/A;  . Dental Extraction     L maxillary molar  . IR KYPHO THORACIC WITH BONE BIOPSY  09/15/2019  . IR VERTEBROPLASTY CERV/THOR BX INC UNI/BIL INC/INJECT/IMAGING  11/10/2019  . KYPHOSIS SURGERY  08/2007   cement used  . OVARIAN CYST SURGERY  left  . RADIOLOGY WITH ANESTHESIA N/A 11/10/2019   Procedure: VERTEBROPLASTY;  Surgeon: Luanne Bras, MD;  Location: [place];  Service: Radiology;  Laterality: N/A;  . TONSILLECTOMY    . TOTAL KNEE ARTHROPLASTY  06/2010   left  . TUBAL LIGATION     Social History:  reports that she quit smoking about 31 years ago. Her smoking use included cigarettes. She started smoking about 68 years ago. She has a 37.00 pack-year smoking history. She has never used smokeless tobacco. She reports current alcohol use of about 1.0 standard drink of alcohol per week. She  reports that she does not use drugs.   Allergies  Allergen Reactions  . Penicillins Hives, Rash and Other (See Comments)    Has patient had a PCN reaction causing immediate rash, facial/tongue/throat swelling, SOB or lightheadedness with hypotension: Yes Has patient had a PCN reaction causing severe rash involving mucus membranes or skin necrosis: yes Has patient had a PCN reaction that required hospitalization: No Has patient had a PCN reaction occurring within the last 10 years: no If all of the above answers are "NO", then may proceed with Cephalosporin use.  Other Reaction: OTHER REACTION  . Valsartan Itching and Other (See Comments)  . Atorvastatin Diarrhea  . Carvedilol Other (See Comments)    "teeth hurt when I took it"   . Ezetimibe Nausea Only  . Fluvastatin Sodium Other (See Comments)    "Can't remember"  . Magnesium Hydroxide Other (See Comments)    REACTION: triggers HAs  . Meloxicam Other (See Comments)    Pt seeing auro's / spots.    . Pneumovax [Pneumococcal Polysaccharide Vaccine] Other (See Comments)    Local reaction  . Quinapril Hcl Other (See Comments)     "feelings of tiredness"  . Simvastatin Diarrhea  . Topamax [Topiramate] Itching  . Vit D-Vit E-Safflower Oil Other (See Comments)    Headaches    Family History  Problem Relation Age of Onset  . Breast cancer Mother   . Heart disease Father        MIs  . Colon cancer Other        cousin  . Breast cancer Sister   . Leukemia Brother        GM    Prior to Admission medications   Medication Sig Start Date End Date Taking? Authorizing Provider  aspirin 81 MG tablet Take 81 mg by mouth daily.   Yes [provider]  bismuth subsalicylate (PEPTO BISMOL) 262 MG/15ML suspension Take 30 mLs by mouth daily as needed for indigestion or diarrhea or loose stools.    Yes [provider]  docusate sodium (COLACE) 100 MG capsule Take 100 mg by mouth 2 (two) times daily.    Yes [provider]  HYDROcodone-acetaminophen (NORCO/VICODIN) 5-325 MG tablet Take 1 tablet by mouth every 4 (four) hours as needed for moderate pain or severe pain. 12/14/19  Yes Paz, Alda Berthold, MD  hydroxyurea (HYDREA) 500 MG capsule Take 1 capsule (500 mg total) by mouth every other day. May take with food to minimize GI side effects. 10/05/19  Yes Lassen, Arlo C, PA-C  levETIRAcetam (KEPPRA) 500 MG tablet Take 1 tablet (500 mg total) by mouth 2 (two) times daily. 11/12/19  Yes Rai, Ripudeep K, MD  bisacodyl (DULCOLAX) 10 MG suppository Place 1 suppository (10 mg total) rectally daily as needed for moderate constipation. 09/17/19   [name], Corrie Mckusick, MD  Infant Care Products [facility]) CREA Apply to buttocks topically  every shift for rash 12/11/19   Colon Branch, MD  Lidocaine 4 % PTCH LIDOCAINE 4% PATCH :Apply 1 patch topically daily and remove after 12 hours to left lateral chest 11/12/19   Rai, Ripudeep K, MD  LORazepam (ATIVAN) 0.5 MG tablet TAKE 1/2 TABLET BY MOUTH EVERY MORNING AND EVERY AFTERNOON. THEN TAKE 1 TABLET BY MOUTH AT BEDTIME AS NEEDED 12/04/19   Colon Branch, MD  metoprolol tartrate (LOPRESSOR) 50 MG tablet Take 1 tablet (50 mg total) by mouth 2 (two) times daily. 10/09/19   Colon Branch, MD  Nutritional Supplements (BOOST BREEZE PO) Take 1 each by mouth daily.     [provider]  omeprazole (PRILOSEC) 20 MG capsule Take 1 capsule (20 mg total) by mouth daily. 10/05/19   [place] Lewis C, PA-C  ondansetron (ZOFRAN) 4 MG tablet Take 1 tablet (4 mg total) by mouth every 6 (six) hours as needed for nausea or vomiting. 10/05/19   [place] Lewis C, PA-C  polyethylene glycol (MIRALAX / GLYCOLAX) 17 g packet Take 17 g by mouth daily.    [provider]  tizanidine (ZANAFLEX) 2 MG capsule Take 1 capsule (2 mg total) by mouth 3 (three) times daily as needed for muscle spasms. 10/05/19   Wille Celeste, PA-C  valACYclovir (VALTREX) 500 MG tablet Take 2 tablets at onset of rash and 2 tablets 12 hours  later as needed. 02/16/19   [place], Holli Humbles, NP   Physical Exam: Vitals:   12/24/19 1228 12/24/19 1245 12/24/19 1400 12/24/19 1429  BP: 122/73 122/73 125/69 (!) 141/72  Pulse: 95 92 88 87  Resp: 14 14 14 16   Temp:  97.9 F (36.6 C)    TempSrc:  Oral    SpO2: 91% 100% 99% 96%  Weight:      Height:         General exam: Moderately built and nourished patient, lying comfortably supine on the gurney in no obvious distress.  Head, eyes and ENT: Nontraumatic and normocephalic.   Neck: Supple. No JVD, carotid bruit or thyromegaly.  Lymphatics: No lymphadenopathy.  Respiratory system: Good air movement and clear to auscultation.  Cardiovascular system: S1 and S2 heard, RRR. No JVD, murmurs, gallops, clicks or pedal edema.  Gastrointestinal system: Abdomen is nondistended, soft and nontender. Normal bowel sounds heard. No organomegaly or masses appreciated.  Central nervous system: Alert and oriented.   Extremities: Left foot is shorter than the right.  Skin: No rashes or acute findings.     Labs on Admission:  Basic Metabolic Panel: Recent Labs  Lab 12/24/19 0813  NA 136  K 3.9  CL 99  CO2 28  GLUCOSE 162*  BUN 9  CREATININE 0.42*  CALCIUM 8.9   Liver Function Tests: Recent Labs  Lab 12/24/19 0813  AST 22  ALT 16  ALKPHOS 74  BILITOT 1.8*  PROT 5.8*  ALBUMIN 3.3*   No results for input(s): LIPASE, AMYLASE in the last 168 hours. No results for input(s): AMMONIA in the last 168 hours. CBC: Recent Labs  Lab 12/24/19 0813  WBC 8.8  NEUTROABS 7.8*  HGB 14.2  HCT 44.3  MCV 101.4*  PLT 279   Cardiac Enzymes: No results for input(s): CKTOTAL, CKMB, CKMBINDEX, TROPONINI in the last 168 hours.  BNP (last 3 results) No results for input(s): PROBNP in the last 8760 hours. CBG: No results for input(s): GLUCAP in the last 168 hours.  Radiological Exams on Admission: CT Head Wo Contrast  Result Date: 12/24/2019 CLINICAL DATA:  Head trauma, minor.  Additional history provided: Unwitnessed fall. EXAM: CT HEAD WITHOUT CONTRAST TECHNIQUE: Contiguous axial images were obtained from the base of the skull through the vertex without intravenous contrast. COMPARISON:  Prior head CT 11/19/2019. FINDINGS: Brain: Stable, mild generalized parenchymal atrophy. Redemonstrated encephalomalacia within the right temporal lobe and anteroinferior right frontal lobe at site of prior contusions. Redemonstrated chronic infarcts within the right thalamus and left cerebellum. Stable background moderate/advanced patchy and confluent hypoattenuation within the cerebral white matter which is nonspecific, but consistent with chronic small vessel ischemic disease. There is no acute intracranial hemorrhage. No acute demarcated cortical infarct is identified. No extra-axial fluid collection. No evidence of intracranial mass. No midline shift. Vascular: No hyperdense vessel.  Atherosclerotic calcifications. Skull: Redemonstrated subacute nondepressed left parietooccipital calvarial fracture. No interval calvarial fracture is identified. Sinuses/Orbits: Visualized orbits show no acute finding. Minimal ethmoid sinus mucosal thickening. No significant mastoid effusion. IMPRESSION: No CT evidence of acute intracranial abnormality. Encephalomalacia at sites of prior right temporal and frontal lobe parenchymal contusions. Redemonstrated subacute nondepressed left parietooccipital calvarial fracture. Stable generalized parenchymal atrophy and chronic ischemic changes as described. Minimal ethmoid sinus mucosal thickening. Electronically Signed   By: Kellie Simmering DO   On: 12/24/2019 09:00   DG Hip Unilat W or Wo Pelvis 2-3 Views Left  Result Date: 12/24/2019 CLINICAL DATA:  Status post fall.  Left hip pain and deformity. EXAM: DG HIP (WITH OR WITHOUT PELVIS) 2-3V LEFT COMPARISON:  11/04/2019. FINDINGS: There is an acute, comminuted, impacted intertrochanteric fracture involving the proximal left  femur. Moderate medial angulation of the distal fracture fragments. No dislocation identified. IMPRESSION: Acute, comminuted, impacted intertrochanteric fracture of the proximal left femur. Electronically Signed   By: Kerby Moors M.D.   On: 12/24/2019 09:08    EKG: Independently reviewed. Pending at the time of this dictation.  Assessment/Plan Comminuted fracture of hip, left, open type I or II, initial encounter [facility]): Orthopedic surgery has been consulted and they recommended surgical intervention which will be performed on 01/25/2020. She is at high risk of acute confusional state and aspiration, will try to limit narcotics and benzodiazepine other than what she takes at home. Use Robaxin for pain control.  Preop eval: She is able to walk up and down the hall with a walker without developing shortness of breath chest pain, she relates no symptoms with walking she relates the only limiting factor when walking is her walker and the length of the hall so she goes back and forth, have explained to her that she is moderate risk due to her comorbidities and age for any cardiopulmonary complications. She understands the risks and benefits and would like to proceed. Daughter was present during my evaluation and she would like to proceed.  Essential hypertension: Continue metoprolol blood pressure seems to be well controlled.  ANXIETY DEPRESSION Continue Ativan.  Chronic diastolic CHF (congestive heart failure) ([place]) Noted seems to be euvolemic continue current home medications.  History of ICH (intracerebral hemorrhage) ([place]) Noted.  Goals of care: Due to her multiple falls and friability, will need to consult palliative care to address goals of care and this conversation will need to continue at skilled nursing facility.    DVT Prophylaxis: SCD due to her recent intracranial hemorrhage she cannot be placed on Lovenox. Code Status: Full Family Communication: daughter  Disposition Plan:  inpatient       It is my clinical opinion that admission to INPATIENT is reasonable and  necessary in this 86 y.o. female frail with a recent fall that led to intracranial hemorrhage comes in for mechanical fall with a comminuted left hip fracture, orthopedic surgery was consulted and recommended surgical intervention.  Given the aforementioned, the predictability of an adverse outcome is felt to be significant. I expect that the patient will require at least 2 midnights in the hospital to treat this condition.  Charlynne Cousins MD [facility]   12/24/2019, 3:41 PM

## 2019-12-25 ENCOUNTER — Inpatient Hospital Stay (HOSPITAL_COMMUNITY): Payer: Medicare Other | Admitting: Anesthesiology

## 2019-12-25 ENCOUNTER — Encounter (HOSPITAL_COMMUNITY): Payer: Self-pay | Admitting: Internal Medicine

## 2019-12-25 ENCOUNTER — Inpatient Hospital Stay (HOSPITAL_COMMUNITY): Payer: Medicare Other

## 2019-12-25 ENCOUNTER — Encounter (HOSPITAL_COMMUNITY)
Admission: EM | Disposition: A | Discharge status: Skilled Nursing Facility | Payer: Self-pay | Source: Skilled Nursing Facility | Attending: Internal Medicine

## 2019-12-25 HISTORY — PX: INTRAMEDULLARY (IM) NAIL INTERTROCHANTERIC: SHX5875

## 2019-12-25 LAB — CBC
HCT: 38 % (ref 36.0–46.0)
Hemoglobin: 11.9 g/dL — ABNORMAL LOW (ref 12.0–15.0)
MCH: 32.6 pg (ref 26.0–34.0)
MCHC: 31.3 g/dL (ref 30.0–36.0)
MCV: 104.1 fL — ABNORMAL HIGH (ref 80.0–100.0)
Platelets: 240 10*3/uL (ref 150–400)
RBC: 3.65 MIL/uL — ABNORMAL LOW (ref 3.87–5.11)
RDW: 16.5 % — ABNORMAL HIGH (ref 11.5–15.5)
WBC: 6 10*3/uL (ref 4.0–10.5)
nRBC: 0 % (ref 0.0–0.2)

## 2019-12-25 LAB — BASIC METABOLIC PANEL
Anion gap: 5 (ref 5–15)
BUN: 12 mg/dL (ref 8–23)
CO2: 28 mmol/L (ref 22–32)
Calcium: 8.3 mg/dL — ABNORMAL LOW (ref 8.9–10.3)
Chloride: 103 mmol/L (ref 98–111)
Creatinine, Ser: 0.37 mg/dL — ABNORMAL LOW (ref 0.44–1.00)
GFR calc Af Amer: >60 mL/min (ref >60)
GFR calc non Af Amer: >60 mL/min (ref >60)
Glucose, Bld: 126 mg/dL — ABNORMAL HIGH (ref 70–99)
Potassium: 3.9 mmol/L (ref 3.5–5.1)
Sodium: 136 mmol/L (ref 135–145)

## 2019-12-25 SURGERY — FIXATION, FRACTURE, INTERTROCHANTERIC, WITH INTRAMEDULLARY ROD
Anesthesia: General | Site: Hip | Laterality: Left

## 2019-12-25 MED ORDER — ONDANSETRON HCL 4 MG/2ML IJ SOLN
4.0000 mg | Freq: Four times a day (QID) | INTRAMUSCULAR | Status: DC | PRN
  Administered 2019-12-26: 4 mg via INTRAVENOUS
  Filled 2019-12-25: qty 2

## 2019-12-25 MED ORDER — ACETAMINOPHEN 325 MG PO TABS
325.0000 mg | ORAL_TABLET | Freq: Four times a day (QID) | ORAL | Status: DC | PRN
  Administered 2019-12-26 – 2019-12-28 (×3): 650 mg via ORAL
  Filled 2019-12-25 (×3): qty 2

## 2019-12-25 MED ORDER — ASPIRIN EC 81 MG PO TBEC
81.0000 mg | DELAYED_RELEASE_TABLET | Freq: Two times a day (BID) | ORAL | Status: DC
  Administered 2019-12-25 – 2019-12-28 (×6): 81 mg via ORAL
  Filled 2019-12-25 (×6): qty 1

## 2019-12-25 MED ORDER — LIDOCAINE 2% (20 MG/ML) 5 ML SYRINGE
INTRAMUSCULAR | Status: DC | PRN
  Administered 2019-12-25: 60 mg via INTRAVENOUS

## 2019-12-25 MED ORDER — DOCUSATE SODIUM 100 MG PO CAPS
100.0000 mg | ORAL_CAPSULE | Freq: Two times a day (BID) | ORAL | Status: DC
  Administered 2019-12-25 – 2019-12-28 (×6): 100 mg via ORAL
  Filled 2019-12-25 (×6): qty 1

## 2019-12-25 MED ORDER — HYDROCODONE-ACETAMINOPHEN 5-325 MG PO TABS
1.0000 | ORAL_TABLET | ORAL | Status: DC | PRN
  Administered 2019-12-26 – 2019-12-27 (×5): 1 via ORAL
  Administered 2019-12-27 (×3): 2 via ORAL
  Administered 2019-12-27: 1 via ORAL
  Administered 2019-12-28 (×3): 2 via ORAL
  Filled 2019-12-25: qty 1
  Filled 2019-12-25 (×2): qty 2
  Filled 2019-12-25: qty 1
  Filled 2019-12-25: qty 2
  Filled 2019-12-25 (×3): qty 1
  Filled 2019-12-25 (×2): qty 2
  Filled 2019-12-25: qty 1
  Filled 2019-12-25 (×2): qty 2

## 2019-12-25 MED ORDER — TRANEXAMIC ACID-NACL 1000-0.7 MG/100ML-% IV SOLN
1000.0000 mg | INTRAVENOUS | Status: AC
  Administered 2019-12-25: 1000 mg via INTRAVENOUS
  Filled 2019-12-25: qty 100

## 2019-12-25 MED ORDER — PROPOFOL 10 MG/ML IV BOLUS
INTRAVENOUS | Status: AC
  Filled 2019-12-25: qty 20

## 2019-12-25 MED ORDER — FENTANYL CITRATE (PF) 100 MCG/2ML IJ SOLN
25.0000 ug | INTRAMUSCULAR | Status: DC | PRN
  Administered 2019-12-25: 50 ug via INTRAVENOUS

## 2019-12-25 MED ORDER — ROCURONIUM BROMIDE 10 MG/ML (PF) SYRINGE
PREFILLED_SYRINGE | INTRAVENOUS | Status: DC | PRN
  Administered 2019-12-25: 50 mg via INTRAVENOUS
  Administered 2019-12-25: 10 mg via INTRAVENOUS

## 2019-12-25 MED ORDER — ACETAMINOPHEN 500 MG PO TABS
1000.0000 mg | ORAL_TABLET | ORAL | Status: AC
  Administered 2019-12-25: 1000 mg via ORAL
  Filled 2019-12-25: qty 2

## 2019-12-25 MED ORDER — METOCLOPRAMIDE HCL 5 MG PO TABS: 5.0000 mg | ORAL_TABLET | Freq: Three times a day (TID) | ORAL | Status: DC | PRN

## 2019-12-25 MED ORDER — FENTANYL CITRATE (PF) 250 MCG/5ML IJ SOLN
INTRAMUSCULAR | Status: AC
  Filled 2019-12-25: qty 5

## 2019-12-25 MED ORDER — SUGAMMADEX SODIUM 200 MG/2ML IV SOLN
INTRAVENOUS | Status: DC | PRN
  Administered 2019-12-25: 200 mg via INTRAVENOUS

## 2019-12-25 MED ORDER — FERROUS SULFATE 325 (65 FE) MG PO TABS
325.0000 mg | ORAL_TABLET | Freq: Two times a day (BID) | ORAL | Status: DC
  Administered 2019-12-26 – 2019-12-28 (×5): 325 mg via ORAL
  Filled 2019-12-25 (×5): qty 1

## 2019-12-25 MED ORDER — PHENYLEPHRINE 40 MCG/ML (10ML) SYRINGE FOR IV PUSH (FOR BLOOD PRESSURE SUPPORT)
PREFILLED_SYRINGE | INTRAVENOUS | Status: DC | PRN
  Administered 2019-12-25 (×2): 120 ug via INTRAVENOUS
  Administered 2019-12-25: 160 ug via INTRAVENOUS

## 2019-12-25 MED ORDER — FENTANYL CITRATE (PF) 100 MCG/2ML IJ SOLN
INTRAMUSCULAR | Status: DC | PRN
  Administered 2019-12-25 (×2): 25 ug via INTRAVENOUS
  Administered 2019-12-25 (×2): 50 ug via INTRAVENOUS

## 2019-12-25 MED ORDER — METHOCARBAMOL 500 MG PO TABS
500.0000 mg | ORAL_TABLET | Freq: Four times a day (QID) | ORAL | Status: DC | PRN
  Administered 2019-12-26 – 2019-12-28 (×4): 500 mg via ORAL
  Filled 2019-12-25 (×4): qty 1

## 2019-12-25 MED ORDER — VANCOMYCIN HCL IN DEXTROSE 1-5 GM/200ML-% IV SOLN
1000.0000 mg | INTRAVENOUS | Status: AC
  Administered 2019-12-25: 1000 mg via INTRAVENOUS
  Filled 2019-12-25: qty 200

## 2019-12-25 MED ORDER — SODIUM CHLORIDE 0.9 % IV SOLN: INTRAVENOUS | Status: DC

## 2019-12-25 MED ORDER — DEXAMETHASONE SODIUM PHOSPHATE 10 MG/ML IJ SOLN
INTRAMUSCULAR | Status: DC | PRN
  Administered 2019-12-25: 5 mg via INTRAVENOUS

## 2019-12-25 MED ORDER — MENTHOL 3 MG MT LOZG
1.0000 | LOZENGE | OROMUCOSAL | Status: DC | PRN
  Administered 2019-12-26: 3 mg via ORAL
  Filled 2019-12-25: qty 9

## 2019-12-25 MED ORDER — VANCOMYCIN HCL IN DEXTROSE 1-5 GM/200ML-% IV SOLN
1000.0000 mg | Freq: Two times a day (BID) | INTRAVENOUS | Status: AC
  Administered 2019-12-26: 1000 mg via INTRAVENOUS
  Filled 2019-12-25: qty 200

## 2019-12-25 MED ORDER — ONDANSETRON HCL 4 MG PO TABS: 4.0000 mg | ORAL_TABLET | Freq: Four times a day (QID) | ORAL | Status: DC | PRN

## 2019-12-25 MED ORDER — ADULT MULTIVITAMIN W/MINERALS CH
1.0000 | ORAL_TABLET | Freq: Every day | ORAL | Status: DC
  Administered 2019-12-26 – 2019-12-28 (×3): 1 via ORAL
  Filled 2019-12-25 (×4): qty 1

## 2019-12-25 MED ORDER — METHOCARBAMOL 500 MG IVPB - SIMPLE MED
INTRAVENOUS | Status: AC
  Filled 2019-12-25: qty 50

## 2019-12-25 MED ORDER — CHLORHEXIDINE GLUCONATE CLOTH 2 % EX PADS
6.0000 | MEDICATED_PAD | Freq: Every day | CUTANEOUS | Status: DC
  Administered 2019-12-25 – 2019-12-28 (×3): 6 via TOPICAL

## 2019-12-25 MED ORDER — ONDANSETRON HCL 4 MG/2ML IJ SOLN
INTRAMUSCULAR | Status: DC | PRN
  Administered 2019-12-25: 4 mg via INTRAVENOUS

## 2019-12-25 MED ORDER — METHOCARBAMOL 500 MG IVPB - SIMPLE MED
500.0000 mg | Freq: Four times a day (QID) | INTRAVENOUS | Status: DC | PRN
  Administered 2019-12-25: 500 mg via INTRAVENOUS
  Filled 2019-12-25: qty 50

## 2019-12-25 MED ORDER — ONDANSETRON HCL 4 MG/2ML IJ SOLN
4.0000 mg | Freq: Four times a day (QID) | INTRAMUSCULAR | Status: DC | PRN
  Administered 2019-12-25: 4 mg via INTRAVENOUS
  Filled 2019-12-25: qty 2

## 2019-12-25 MED ORDER — POLYETHYLENE GLYCOL 3350 17 G PO PACK: 17.0000 g | PACK | Freq: Every day | ORAL | Status: DC

## 2019-12-25 MED ORDER — ONDANSETRON HCL 4 MG/2ML IJ SOLN: 4.0000 mg | Freq: Once | INTRAMUSCULAR | Status: DC | PRN

## 2019-12-25 MED ORDER — ENSURE ENLIVE PO LIQD
237.0000 mL | Freq: Two times a day (BID) | ORAL | Status: DC
  Administered 2019-12-26 – 2019-12-28 (×4): 237 mL via ORAL
  Filled 2019-12-25 (×2): qty 237

## 2019-12-25 MED ORDER — STERILE WATER FOR IRRIGATION IR SOLN
Status: DC | PRN
  Administered 2019-12-25: 2000 mL

## 2019-12-25 MED ORDER — MORPHINE SULFATE (PF) 2 MG/ML IV SOLN: 0.5000 mg | INTRAVENOUS | Status: DC | PRN

## 2019-12-25 MED ORDER — METOCLOPRAMIDE HCL 5 MG/ML IJ SOLN: 5.0000 mg | Freq: Three times a day (TID) | INTRAMUSCULAR | Status: DC | PRN

## 2019-12-25 MED ORDER — PHENOL 1.4 % MT LIQD
1.0000 | OROMUCOSAL | Status: DC | PRN
  Filled 2019-12-25: qty 177

## 2019-12-25 MED ORDER — FENTANYL CITRATE (PF) 100 MCG/2ML IJ SOLN
INTRAMUSCULAR | Status: AC
  Filled 2019-12-25: qty 2

## 2019-12-25 MED ORDER — 0.9 % SODIUM CHLORIDE (POUR BTL) OPTIME
TOPICAL | Status: DC | PRN
  Administered 2019-12-25: 1000 mL

## 2019-12-25 MED ORDER — PROPOFOL 10 MG/ML IV BOLUS
INTRAVENOUS | Status: DC | PRN
  Administered 2019-12-25: 90 mg via INTRAVENOUS

## 2019-12-25 MED ORDER — LACTATED RINGERS IV SOLN: INTRAVENOUS | Status: DC

## 2019-12-25 MED ORDER — POVIDONE-IODINE 10 % EX SWAB
2.0000 "application " | Freq: Once | CUTANEOUS | Status: AC
  Administered 2019-12-25: 2 via TOPICAL

## 2019-12-25 SURGICAL SUPPLY — 43 items
ADH SKN CLS APL DERMABOND .7 (GAUZE/BANDAGES/DRESSINGS) ×1
BAG SPEC THK2 15X12 ZIP CLS (MISCELLANEOUS) ×1
BAG ZIPLOCK 12X15 (MISCELLANEOUS) ×3 IMPLANT
BIT DRILL CANN LG 4.3MM (BIT) IMPLANT
COVER PERINEAL POST (MISCELLANEOUS) ×3 IMPLANT
COVER SURGICAL LIGHT HANDLE (MISCELLANEOUS) ×3 IMPLANT
COVER WAND RF STERILE (DRAPES) IMPLANT
DERMABOND ADVANCED (GAUZE/BANDAGES/DRESSINGS) ×2
DERMABOND ADVANCED .7 DNX12 (GAUZE/BANDAGES/DRESSINGS) IMPLANT
DRAPE STERI IOBAN 125X83 (DRAPES) ×3 IMPLANT
DRESSING AQUACEL AG SP 3.5X4 (GAUZE/BANDAGES/DRESSINGS) IMPLANT
DRESSING AQUACEL AG SP 3.5X6 (GAUZE/BANDAGES/DRESSINGS) IMPLANT
DRILL BIT CANN LG 4.3MM (BIT) ×3
DRSG AQUACEL AG ADV 3.5X 4 (GAUZE/BANDAGES/DRESSINGS) ×6 IMPLANT
DRSG AQUACEL AG ADV 3.5X 6 (GAUZE/BANDAGES/DRESSINGS) ×3 IMPLANT
DRSG AQUACEL AG SP 3.5X4 (GAUZE/BANDAGES/DRESSINGS) ×3
DRSG AQUACEL AG SP 3.5X6 (GAUZE/BANDAGES/DRESSINGS) ×3
DURAPREP 26ML APPLICATOR (WOUND CARE) ×3 IMPLANT
ELECT REM PT RETURN 15FT ADLT (MISCELLANEOUS) ×3 IMPLANT
GLOVE BIOGEL PI IND STRL 7.5 (GLOVE) ×1 IMPLANT
GLOVE BIOGEL PI IND STRL 8.5 (GLOVE) ×1 IMPLANT
GLOVE BIOGEL PI INDICATOR 7.5 (GLOVE) ×2
GLOVE BIOGEL PI INDICATOR 8.5 (GLOVE) ×2
GLOVE ECLIPSE 8.0 STRL XLNG CF (GLOVE) ×3 IMPLANT
GLOVE ORTHO TXT STRL SZ7.5 (GLOVE) ×3 IMPLANT
GOWN STRL REUS W/TWL 2XL LVL3 (GOWN DISPOSABLE) ×3 IMPLANT
GOWN STRL REUS W/TWL LRG LVL3 (GOWN DISPOSABLE) ×3 IMPLANT
GUIDEPIN 3.2X17.5 THRD DISP (PIN) ×2 IMPLANT
KIT BASIN OR (CUSTOM PROCEDURE TRAY) ×3 IMPLANT
KIT TURNOVER KIT A (KITS) IMPLANT
MANIFOLD NEPTUNE II (INSTRUMENTS) ×3 IMPLANT
NAIL HIP FRACT 130D 11X180 (Screw) ×2 IMPLANT
PACK GENERAL/GYN (CUSTOM PROCEDURE TRAY) ×3 IMPLANT
PADDING CAST COTTON 6X4 STRL (CAST SUPPLIES) ×3 IMPLANT
PENCIL SMOKE EVACUATOR (MISCELLANEOUS) IMPLANT
PROTECTOR NERVE ULNAR (MISCELLANEOUS) ×3 IMPLANT
SCREW BONE CORTICAL 5.0X42 (Screw) ×2 IMPLANT
SCREW LAG 10.5MMX105MM HFN (Screw) ×2 IMPLANT
SUT VIC AB 1 CT1 36 (SUTURE) ×3 IMPLANT
SUT VIC AB 2-0 CT1 27 (SUTURE) ×6
SUT VIC AB 2-0 CT1 27XBRD (SUTURE) ×2 IMPLANT
TOWEL OR 17X26 10 PK STRL BLUE (TOWEL DISPOSABLE) ×3 IMPLANT
TOWEL OR NON WOVEN STRL DISP B (DISPOSABLE) ×3 IMPLANT

## 2019-12-25 NOTE — Anesthesia Procedure Notes (Signed)
Procedure Name: Intubation Performed by: Gean Maidens, CRNA Pre-anesthesia Checklist: Patient identified, Emergency Drugs available, Suction available and Timeout performed Patient Re-evaluated:Patient Re-evaluated prior to induction Oxygen Delivery Method: Circle system utilized Preoxygenation: Pre-oxygenation with 100% oxygen Induction Type: IV induction Ventilation: Mask ventilation without difficulty Laryngoscope Size: Mac and 3 Grade View: Grade I Tube type: Oral Tube size: 7.0 mm Number of attempts: 1 Airway Equipment and Method: Stylet Placement Confirmation: ETT inserted through vocal cords under direct vision,  positive ETCO2 and breath sounds checked- equal and bilateral Tube secured with: Tape Dental Injury: Teeth and Oropharynx as per pre-operative assessment

## 2019-12-25 NOTE — Anesthesia Postprocedure Evaluation (Signed)
Anesthesia Post Note  Patient: [name]  Procedure(s) Performed: INTRAMEDULLARY (IM) NAIL INTERTROCHANTRIC (Left Hip)     Patient location during evaluation: PACU Anesthesia Type: General Level of consciousness: awake and alert, awake and oriented Pain management: pain level controlled Vital Signs Assessment: post-procedure vital signs reviewed and stable Respiratory status: spontaneous breathing, nonlabored ventilation, respiratory function stable and patient connected to nasal cannula oxygen Cardiovascular status: blood pressure returned to baseline and stable Postop Assessment: no apparent nausea or vomiting Anesthetic complications: no   No complications documented.  Last Vitals:  Vitals:   12/25/19 1815 12/25/19 1830  BP: (!) 168/81 (!) 155/80  Pulse: 84 82  Resp: 13 13  Temp: 36.8 C 36.7 C  SpO2: 97% 95%    Last Pain:  Vitals:   12/25/19 1830  TempSrc: Axillary  PainSc:                  [name]

## 2019-12-25 NOTE — Interval H&P Note (Signed)
History and Physical Interval Note:  12/25/2019 3:49 PM  [name]  has presented today for surgery, with the diagnosis of INTERTROCHANTERIC HIP FRACTURE.  The various methods of treatment have been discussed with the patient and family. After consideration of risks, benefits and other options for treatment, the patient has consented to  Procedure(s): INTRAMEDULLARY (IM) NAIL INTERTROCHANTRIC (Left) as a surgical intervention.  The patient's history has been reviewed, patient examined, no change in status, stable for surgery.  I have reviewed the patient's chart and labs.  Questions were answered to the patient's satisfaction.     Mauri Pole

## 2019-12-25 NOTE — TOC Initial Note (Signed)
Transition of Care [facility]) - Initial/Assessment Note    Patient Details  Name: [name] MRN: [id number] Date of Birth: 1930-04-26  Transition of Care [facility]) CM/SW Contact:    Lennart Pall, LCSW Phone Number: 12/25/2019, 1:36 PM  Clinical Narrative:                 Met with pt and have spoken with daughter, [name], to begin discussions of d/c plans post surgery.  Pt and daughter report patient has been at [facility] ILF/ ALF ~ 2 weeks at time of fall.  She has previously been in [facility] and daughter notes pt has suffered 3 falls since April.   Daughter anticipates need for SNF rehab after surgery and hopes to have her return to [facility].  Daughter has already spoken with that facility to let them know her wishes.  TOC team to follow and assist with dc needs.  Plan for surgery this afternoon.  Expected Discharge Plan: Skilled Nursing Facility Barriers to Discharge: Continued Medical Work up   Patient Goals and CMS Choice   CMS Medicare.gov Compare Post Acute Care list provided to:: Patient Choice offered to / list presented to : Patient, Adult Children  Expected Discharge Plan and Services Expected Discharge Plan: [place] In-house Referral: Clinical Social Work     Living arrangements for the past 2 months: [place]                 DME Arranged: N/A DME Agency: NA       HH Arranged: NA [place] Agency: NA        Prior Living Arrangements/Services Living arrangements for the past 2 months: [place] Lives with:: Self, Facility Resident Patient language and need for interpreter reviewed:: No Do you feel safe going back to the place where you live?: Yes      Need for Family Participation in Patient Care: No (Comment) Care giver support system in place?: Yes (comment)   Criminal Activity/Legal Involvement Pertinent to Current Situation/Hospitalization: No - Comment as needed  Activities of  Daily Living Home Assistive Devices/Equipment: Eyeglasses, Wheelchair, Environmental consultant (specify type) (front wheeled [facility] has necessary equipment for their residents) ADL Screening (condition at time of admission) Patient's cognitive ability adequate to safely complete daily activities?: Yes Is the patient deaf or have difficulty hearing?: Yes (very [place]) Does the patient have difficulty seeing, even when wearing glasses/contacts?: No Does the patient have difficulty concentrating, remembering, or making decisions?: Yes Patient able to express need for assistance with ADLs?: Yes Does the patient have difficulty dressing or bathing?: Yes Independently performs ADLs?: No Communication: Independent Dressing (OT): Needs assistance Is this a change from baseline?: Pre-admission baseline Grooming: Needs assistance Is this a change from baseline?: Pre-admission baseline Feeding: Needs assistance Is this a change from baseline?: Pre-admission baseline Bathing: Needs assistance Is this a change from baseline?: Pre-admission baseline Toileting: Dependent Is this a change from baseline?: Change from baseline, expected to last >3days In/Out Bed: Dependent Is this a change from baseline?: Change from baseline, expected to last >3 days Walks in Home: Dependent Is this a change from baseline?: Change from baseline, expected to last >3 days Does the patient have difficulty walking or climbing stairs?: Yes (does not do stairs) Weakness of Legs: Left Weakness of Arms/Hands: None  Permission Sought/Granted Permission sought to share information with : Family Supports    Share Information with NAME: [name]     Permission  granted to share info w Relationship: daughter  Permission granted to share info w Contact Information: [phone]  Emotional Assessment Appearance:: Appears stated age Attitude/Demeanor/Rapport: Lethargic Affect (typically observed): Quiet Orientation: :  Oriented to Self, Oriented to Place, Oriented to  Time, Oriented to Situation Alcohol / Substance Use: Not Applicable Psych Involvement: No (comment)  Admission diagnosis:  Closed left hip fracture ([place]) [S72.002A] Closed fracture of left hip, initial encounter ([place]) [S72.002A] Comminuted fracture of hip, left, open type I or II, initial encounter [facility]) [S72.092B] Patient Active Problem List   Diagnosis Date Noted  . Comminuted fracture of hip, left, open type I or II, initial encounter ([place]) 12/24/2019  . Closed left hip fracture ([place]) 12/24/2019  . ICH (intracerebral hemorrhage) ([place]) 11/04/2019  . Intractable back pain 09/13/2019  . Compression fracture of T9 vertebra ([place]) 09/13/2019  . Chronic diastolic CHF (congestive heart failure) ([place]) 09/13/2019  . Overweight (BMI 25.0-29.9) 09/13/2019  . Constipation 09/13/2019  . Raynaud's phenomenon without gangrene 05/19/2018  . Iron deficiency anemia due to chronic blood loss 12/30/2017  . Insomnia 06/25/2017  . Mild CAD 05/24/2017  . PCP NOTES >>> 02/15/2015  . Superficial thrombophlebitis 04/06/2014  . Annual physical exam 08/18/2012  . Edema 04/14/2012  . Breast cancer ([place]) 11/05/2011  . Polycythemia 11/05/2011  . TIA (transient ischemic attack) 01/16/2011  . CARDIOMYOPATHY 08/03/2009  . DJD -- pain mgmt 08/16/2008  . ABDOMINAL PAIN, LEFT LOWER QUADRANT 07/05/2008  . GAIT DISTURBANCE 10/21/2007  . ANXIETY DEPRESSION 10/08/2007  . Hyperlipidemia 06/25/2007  . Takotsubo cardiomyopathy 06/25/2007  . GERD without esophagitis 06/25/2007  . Essential hypertension 09/30/2006  . Senile osteoporosis 09/30/2006  . Migraine with aura 09/30/2006   PCP:  Colon Branch, MD Pharmacy:   [facility] [place], [address] AT [facility] [address] [place] [address] Phone: [phone] Fax: [phone] of [place], [place] [address] [place] [address] Phone: [phone] Fax: [phone]     Social Determinants of Health (SDOH) Interventions    Readmission Risk Interventions Readmission Risk Prevention Plan 09/14/2019  Medication Screening Complete  Transportation Screening Complete  Some recent data might be hidden

## 2019-12-25 NOTE — H&P (View-Only) (Signed)
Patient ID: [name], female   DOB: 12-30-1929, 86 y.o.   MRN: [id number] Subjective:   Procedure(s) (LRB): INTRAMEDULLARY (IM) NAIL INTERTROCHANTRIC (Left)    Patient reports pain as moderate on bedrest No other injuries to report Lives independently with her cat  Objective:   VITALS:   Vitals:   12/25/19 0202 12/25/19 0607  BP: (!) 156/75 (!) 157/70  Pulse: 86 86  Resp: 16 16  Temp: 97.9 F (36.6 C) 97.9 F (36.6 C)  SpO2: 92% 91%    Neurovascular intact  LLE slightly shortened and externally rotated  LABS Recent Labs    12/24/19 0813 12/25/19 0313  HGB 14.2 11.9*  HCT 44.3 38.0  WBC 8.8 6.0  PLT 279 240    Recent Labs    12/24/19 0813 12/25/19 0313  NA 136 136  K 3.9 3.9  BUN 9 12  CREATININE 0.42* 0.37*  GLUCOSE 162* 126*    Recent Labs    12/24/19 1813  INR 1.1     Assessment/Plan:   Procedure(s) (LRB): INTRAMEDULLARY (IM) NAIL INTERTROCHANTRIC (Left)  Left hip IT fracture  Plan: NPO To OR today for ORIF Post op instructions to follow  Add IV zofran for nausea

## 2019-12-25 NOTE — Transfer of Care (Signed)
Immediate Anesthesia Transfer of Care Note  Patient: [name]  Procedure(s) Performed: INTRAMEDULLARY (IM) NAIL INTERTROCHANTRIC (Left Hip)  Patient Location: PACU  Anesthesia Type:General  Level of Consciousness: sedated, patient cooperative and responds to stimulation  Airway & Oxygen Therapy: Patient Spontanous Breathing and Patient connected to face mask oxygen  Post-op Assessment: Report given to RN and Post -op Vital signs reviewed and stable  Post vital signs: Reviewed and stable  Last Vitals:  Vitals Value Taken Time  BP 152/99 12/25/19 1717  Temp 36.8 C 12/25/19 1717  Pulse 85 12/25/19 1720  Resp 16 12/25/19 1720  SpO2 98 % 12/25/19 1720  Vitals shown include unvalidated device data.  Last Pain:  Vitals:   12/25/19 1717  TempSrc:   PainSc: Asleep      Patients Stated Pain Goal: 3 (93/79/02 4097)  Complications: No complications documented.

## 2019-12-25 NOTE — Progress Notes (Signed)
Patient ID: [name], female   DOB: 08/02/1929, 86 y.o.   MRN: [id number] Subjective:   Procedure(s) (LRB): INTRAMEDULLARY (IM) NAIL INTERTROCHANTRIC (Left)    Patient reports pain as moderate on bedrest No other injuries to report Lives independently with her cat  Objective:   VITALS:   Vitals:   12/25/19 0202 12/25/19 0607  BP: (!) 156/75 (!) 157/70  Pulse: 86 86  Resp: 16 16  Temp: 97.9 F (36.6 C) 97.9 F (36.6 C)  SpO2: 92% 91%    Neurovascular intact  LLE slightly shortened and externally rotated  LABS Recent Labs    12/24/19 0813 12/25/19 0313  HGB 14.2 11.9*  HCT 44.3 38.0  WBC 8.8 6.0  PLT 279 240    Recent Labs    12/24/19 0813 12/25/19 0313  NA 136 136  K 3.9 3.9  BUN 9 12  CREATININE 0.42* 0.37*  GLUCOSE 162* 126*    Recent Labs    12/24/19 1813  INR 1.1     Assessment/Plan:   Procedure(s) (LRB): INTRAMEDULLARY (IM) NAIL INTERTROCHANTRIC (Left)  Left hip IT fracture  Plan: NPO To OR today for ORIF Post op instructions to follow  Add IV zofran for nausea

## 2019-12-25 NOTE — Brief Op Note (Signed)
12/24/2019 - 12/25/2019  4:58 PM  PATIENT:  Lonia Farber  86 y.o. female  PRE-OPERATIVE DIAGNOSIS:  Left INTERTROCHANTERIC HIP FRACTURE  POST-OPERATIVE DIAGNOSIS:  Left intertrochanteric hip fracture  PROCEDURE:  Procedure(s): INTRAMEDULLARY (IM) NAIL INTERTROCHANTRIC (Left)  SURGEON:  Surgeon(s) and Role:    Paralee Cancel, MD - Primary  PHYSICIAN ASSISTANT: Griffith Citron, PA-C  ANESTHESIA:   general  EBL:  100 mL   BLOOD ADMINISTERED:none  DRAINS: none   LOCAL MEDICATIONS USED:  NONE  SPECIMEN:  No Specimen  DISPOSITION OF SPECIMEN:  N/A  COUNTS:  YES  TOURNIQUET:  * No tourniquets in log *  DICTATION: .Other Dictation: Dictation Number [id number]  PLAN OF CARE: Admit to inpatient   PATIENT DISPOSITION:  PACU - hemodynamically stable.   Delay start of Pharmacological VTE agent (>24hrs) due to surgical blood loss or risk of bleeding: no

## 2019-12-25 NOTE — Progress Notes (Signed)
PROGRESS NOTE    [name]  PXT:[id number] DOB: January 13, 1930 DOA: 12/24/2019 PCP: Colon Branch, MD   Brief Narrative: [name] is a 86 y.o. female past medical history of coronary artery disease Takotsubo cardiomyopathy in 6270, chronic diastolic heart failure CVA and hypertension who was recently discharged from the hospital on 11/12/2019 for an intracranial hemorrhage and acute and cephalopathy due to mechanical fall comes in again for a fall that happened on the day of admission she relates she slipped and fell on her left hip she did not hit her head, she denies any chest pain shortness of breath nausea vomiting lightheadedness double vision or change in vision he did develop sudden left-sided pain related with movement.  Assessment & Plan:   Active Problems:   ANXIETY DEPRESSION   Essential hypertension   Senile osteoporosis   Chronic diastolic CHF (congestive heart failure) (HCC)   ICH (intracerebral hemorrhage) (HCC)   Comminuted fracture of hip, left, open type I or II, initial encounter ([place])   Closed left hip fracture ([place])   #1 left hip fracture for ORIF today.  Continue pain control.  DVT prophylaxis per Ortho.  Add stool softeners.  #2 history of essential hypertension blood pressure 157/70.  On Lopressor 50 mg twice a day.  #3 history of anxiety and depression not on any medications at home  #4 history of chronic diastolic CHF currently stable and appears dry continue slow IV fluids.  #5 history of intracranial hemorrhage stable  #6 history of seizure disorder on Keppra    Pressure Injury 12/24/19 Buttocks Stage 1 -  Intact skin with non-blanchable redness of a localized area usually over a bony prominence. (Active)  12/24/19 1640  Location: Buttocks  Location Orientation:   Staging: Stage 1 -  Intact skin with non-blanchable redness of a localized area usually over a bony prominence.  Wound Description (Comments):   Present on Admission: Yes      Estimated body mass index is 25.68 kg/m as calculated from the following:   Height as of this encounter: 5\' 5"  (1.651 m).   Weight as of this encounter: 70 kg.  DVT prophylaxis: SCD Code Status: Full code Family Communication: None at bedside  disposition Plan:  Status is: Inpatient  Dispo: The patient is from: Home              Anticipated d/c is to: Home              Anticipated d/c date is: Unknown              Patient currently is not medically stable to d/c.  Patient admitted with left hip fracture  Consultants:  Ortho Procedures: None Antimicrobials: None  Subjective:  Patient complaining of nausea and trying to vomit on Zofran Objective: Vitals:   12/24/19 1615 12/24/19 2013 12/25/19 0202 12/25/19 0607  BP: 139/70 137/77 (!) 156/75 (!) 157/70  Pulse: 100 (!) 103 86 86  Resp: 18 16 16 16   Temp: 98.4 F (36.9 C) 98.9 F (37.2 C) 97.9 F (36.6 C) 97.9 F (36.6 C)  TempSrc: Axillary Oral    SpO2: 94% 94% 92% 91%  Weight:      Height:        Intake/Output Summary (Last 24 hours) at 12/25/2019 1243 Last data filed at 12/25/2019 1000 Gross per 24 hour  Intake 2205.29 ml  Output 300 ml  Net 1905.29 ml   Filed Weights   12/24/19 0805  Weight: 70 kg  Examination:  General exam: Appears calm and comfortable  Respiratory system: Clear to auscultation. Respiratory effort normal. Cardiovascular system: S1 & S2 heard, RRR. No JVD, murmurs, rubs, gallops or clicks. No pedal edema. Gastrointestinal system: Abdomen is nondistended, soft and nontender. No organomegaly or masses felt. Normal bowel sounds heard. Central nervous system: Alert and oriented. No focal neurological deficits. Extremities: Left hip tenderness  skin: No rashes, lesions or ulcers Psychiatry: Judgement and insight appear normal. Mood & affect appropriate.     Data Reviewed: I have personally reviewed following labs and imaging studies  CBC: Recent Labs  Lab 12/24/19 0813  12/25/19 0313  WBC 8.8 6.0  NEUTROABS 7.8*  --   HGB 14.2 11.9*  HCT 44.3 38.0  MCV 101.4* 104.1*  PLT 279 532   Basic Metabolic Panel: Recent Labs  Lab 12/24/19 0813 12/25/19 0313  NA 136 136  K 3.9 3.9  CL 99 103  CO2 28 28  GLUCOSE 162* 126*  BUN 9 12  CREATININE 0.42* 0.37*  CALCIUM 8.9 8.3*   GFR: Estimated Creatinine Clearance: 47.7 mL/min (A) (by C-G formula based on SCr of 0.37 mg/dL (L)). Liver Function Tests: Recent Labs  Lab 12/24/19 0813  AST 22  ALT 16  ALKPHOS 74  BILITOT 1.8*  PROT 5.8*  ALBUMIN 3.3*   No results for input(s): LIPASE, AMYLASE in the last 168 hours. No results for input(s): AMMONIA in the last 168 hours. Coagulation Profile: Recent Labs  Lab 12/24/19 1813  INR 1.1   Cardiac Enzymes: No results for input(s): CKTOTAL, CKMB, CKMBINDEX, TROPONINI in the last 168 hours. BNP (last 3 results) No results for input(s): PROBNP in the last 8760 hours. HbA1C: No results for input(s): HGBA1C in the last 72 hours. CBG: No results for input(s): GLUCAP in the last 168 hours. Lipid Profile: No results for input(s): CHOL, HDL, LDLCALC, TRIG, CHOLHDL, LDLDIRECT in the last 72 hours. Thyroid Function Tests: No results for input(s): TSH, T4TOTAL, FREET4, T3FREE, THYROIDAB in the last 72 hours. Anemia Panel: No results for input(s): VITAMINB12, FOLATE, FERRITIN, TIBC, IRON, RETICCTPCT in the last 72 hours. Sepsis Labs: No results for input(s): PROCALCITON, LATICACIDVEN in the last 168 hours.  Recent Results (from the past 240 hour(s))  SARS Coronavirus 2 by RT PCR (hospital order, performed in [facility] hospital lab) Nasopharyngeal Nasopharyngeal Swab     Status: None   Collection Time: 12/24/19  8:14 AM   Specimen: Nasopharyngeal Swab  Result Value Ref Range Status   SARS Coronavirus 2 NEGATIVE NEGATIVE Final    Comment: (NOTE) SARS-CoV-2 target nucleic acids are NOT DETECTED.  The SARS-CoV-2 RNA is generally detectable in upper and  lower respiratory specimens during the acute phase of infection. The lowest concentration of SARS-CoV-2 viral copies this assay can detect is 250 copies / mL. A negative result does not preclude SARS-CoV-2 infection and should not be used as the sole basis for treatment or other patient management decisions.  A negative result may occur with improper specimen collection / handling, submission of specimen other than nasopharyngeal swab, presence of viral mutation(s) within the areas targeted by this assay, and inadequate number of viral copies (<250 copies / mL). A negative result must be combined with clinical observations, patient history, and epidemiological information.  Fact Sheet for Patients:   StrictlyIdeas.no  Fact Sheet for Healthcare Providers: BankingDealers.co.za  This test is not yet approved or  cleared by the [place] FDA and has been authorized for detection and/or diagnosis of  SARS-CoV-2 by FDA under an Emergency Use Authorization (EUA).  This EUA will remain in effect (meaning this test can be used) for the duration of the COVID-19 declaration under Section 564(b)(1) of the Act, 21 U.S.C. section 360bbb-3(b)(1), unless the authorization is terminated or revoked sooner.  Performed at [facility], [place] [address], [place], [place] [address]   Surgical pcr screen     Status: None   Collection Time: 12/24/19  6:22 PM   Specimen: Nasal Mucosa; Nasal Swab  Result Value Ref Range Status   MRSA, PCR NEGATIVE NEGATIVE Final   Staphylococcus aureus NEGATIVE NEGATIVE Final    Comment: (NOTE) The Xpert SA Assay (FDA approved for NASAL specimens in patients 38 years of age and older), is one component of a comprehensive surveillance program. It is not intended to diagnose infection nor to guide or monitor treatment. Performed at [facility], [place] [address] [place], [place]  [address]          Radiology Studies: CT Head Wo Contrast  Result Date: 12/24/2019 CLINICAL DATA:  Head trauma, minor. Additional history provided: Unwitnessed fall. EXAM: CT HEAD WITHOUT CONTRAST TECHNIQUE: Contiguous axial images were obtained from the base of the skull through the vertex without intravenous contrast. COMPARISON:  Prior head CT 11/19/2019. FINDINGS: Brain: Stable, mild generalized parenchymal atrophy. Redemonstrated encephalomalacia within the right temporal lobe and anteroinferior right frontal lobe at site of prior contusions. Redemonstrated chronic infarcts within the right thalamus and left cerebellum. Stable background moderate/advanced patchy and confluent hypoattenuation within the cerebral white matter which is nonspecific, but consistent with chronic small vessel ischemic disease. There is no acute intracranial hemorrhage. No acute demarcated cortical infarct is identified. No extra-axial fluid collection. No evidence of intracranial mass. No midline shift. Vascular: No hyperdense vessel.  Atherosclerotic calcifications. Skull: Redemonstrated subacute nondepressed left parietooccipital calvarial fracture. No interval calvarial fracture is identified. Sinuses/Orbits: Visualized orbits show no acute finding. Minimal ethmoid sinus mucosal thickening. No significant mastoid effusion. IMPRESSION: No CT evidence of acute intracranial abnormality. Encephalomalacia at sites of prior right temporal and frontal lobe parenchymal contusions. Redemonstrated subacute nondepressed left parietooccipital calvarial fracture. Stable generalized parenchymal atrophy and chronic ischemic changes as described. Minimal ethmoid sinus mucosal thickening. Electronically Signed   By: Kellie Simmering DO   On: 12/24/2019 09:00   DG Hip Unilat W or Wo Pelvis 2-3 Views Left  Result Date: 12/24/2019 CLINICAL DATA:  Status post fall.  Left hip pain and deformity. EXAM: DG HIP (WITH OR WITHOUT PELVIS) 2-3V LEFT  COMPARISON:  11/04/2019. FINDINGS: There is an acute, comminuted, impacted intertrochanteric fracture involving the proximal left femur. Moderate medial angulation of the distal fracture fragments. No dislocation identified. IMPRESSION: Acute, comminuted, impacted intertrochanteric fracture of the proximal left femur. Electronically Signed   By: Kerby Moors M.D.   On: 12/24/2019 09:08        Scheduled Meds: . aspirin EC  81 mg Oral Daily  . hydroxyurea  500 mg Oral QODAY  . levETIRAcetam  500 mg Oral BID  . LORazepam  0.25 mg Oral BID WC  . metoprolol tartrate  50 mg Oral BID  . pantoprazole  40 mg Oral Daily  . polyethylene glycol  17 g Oral Daily   Continuous Infusions: . sodium chloride 50 mL/hr at 12/24/19 1758  . methocarbamol (ROBAXIN) IV       LOS: 1 day     Georgette Shell, MD 12/25/2019, 12:43 PM

## 2019-12-25 NOTE — Plan of Care (Signed)
Continuing to help decrease patient level of anxiety related to upcoming procedure this afternoon. Deanna Artis Rn

## 2019-12-25 NOTE — Anesthesia Preprocedure Evaluation (Addendum)
Anesthesia Evaluation  Patient identified by MRN, date of birth, ID band Patient awake    Reviewed: Allergy & Precautions, NPO status , Patient's Chart, lab work & pertinent test results, reviewed documented beta blocker date and time   Airway Mallampati: II  TM Distance: >3 FB Neck ROM: Full    Dental  (+) Missing, Poor Dentition   Pulmonary sleep apnea , former smoker,    Pulmonary exam normal breath sounds clear to auscultation       Cardiovascular hypertension, Pt. on home beta blockers + CAD, +CHF and + DVT  Normal cardiovascular exam Rhythm:Regular Rate:Normal     Neuro/Psych  Headaches, PSYCHIATRIC DISORDERS Anxiety Depression TIACVA, Residual Symptoms    GI/Hepatic Neg liver ROS, hiatal hernia, GERD  Medicated,  Endo/Other  negative endocrine ROS  Renal/GU negative Renal ROS     Musculoskeletal negative musculoskeletal ROS (+) Arthritis , INTERTROCHANTERIC HIP FRACTURE   Abdominal   Peds  Hematology  (+) Blood dyscrasia, anemia ,   Anesthesia Other Findings   Reproductive/Obstetrics                           Anesthesia Physical Anesthesia Plan  ASA: III  Anesthesia Plan: General   Post-op Pain Management:    Induction: Intravenous  PONV Risk Score and Plan: 3 and Midazolam, Dexamethasone and Ondansetron  Airway Management Planned: Oral ETT  Additional Equipment:   Intra-op Plan:   Post-operative Plan: Extubation in OR  Informed Consent: I have reviewed the patients History and Physical, chart, labs and discussed the procedure including the risks, benefits and alternatives for the proposed anesthesia with the patient or authorized representative who has indicated his/her understanding and acceptance.       Plan Discussed with: CRNA  Anesthesia Plan Comments:        Anesthesia Quick Evaluation

## 2019-12-25 NOTE — Progress Notes (Signed)
Initial Nutrition Assessment  DOCUMENTATION CODES:   Non-severe (moderate) malnutrition in context of acute illness/injury  INTERVENTION:  Once pt's diet is advanced:  Ensure Enlive po BID, each supplement provides 350 kcal and 20 grams of protein  MVI daily  NUTRITION DIAGNOSIS:   Moderate Malnutrition related to acute illness (ICH) as evidenced by mild fat depletion, mild muscle depletion.    GOAL:   Patient will meet greater than or equal to 90% of their needs    MONITOR:   PO intake, Supplement acceptance, Labs, Weight trends, I & O's  REASON FOR ASSESSMENT:   Consult Assessment of nutrition requirement/status  ASSESSMENT:   Pt admitted with hip fracture s/p fall. PMH includes CAD, Takotsubo cardiomyopathy, CHF, CVA, HTN, recent hospitalization for ICH due to mechanical fall.  Per MD, plan is for pt to go to OR today for ORIF.  Discussed pt with RN. Pt reporting severe nausea today and some episodes of emesis. Pt states that PTA, she typically ate 3 meals per day -- B: cereal and orange juice, L: soup, D: soup. Pt also drinks 1 Ensure per day.  Observed completed Ensure surgery at bedside.   Reviewed wt readings, no significant wt changes noted. Pt did not respond when asked about any wt changes.   No PO intake documented.   Labs reviewed. Medications: Protonix, Miralax  NUTRITION - FOCUSED PHYSICAL EXAM:    Most Recent Value  Orbital Region Mild depletion  Upper Arm Region Mild depletion  Thoracic and Lumbar Region Mild depletion  Buccal Region Mild depletion  Temple Region Mild depletion  Clavicle Bone Region Mild depletion  Clavicle and Acromion Bone Region Mild depletion  Scapular Bone Region Mild depletion  Dorsal Hand No depletion  Patellar Region Moderate depletion  Anterior Thigh Region Moderate depletion  Posterior Calf Region Moderate depletion  Edema (RD Assessment) None  Hair Reviewed  Eyes Reviewed  Mouth Reviewed  Skin Reviewed   Nails Reviewed       Diet Order:   Diet Order            Diet NPO time specified  Diet effective now                 EDUCATION NEEDS:   No education needs have been identified at this time  Skin:  Skin Assessment: Skin Integrity Issues: Skin Integrity Issues:: Stage I Stage I: buttocks  Last BM:  8/11  Height:   Ht Readings from Last 1 Encounters:  12/24/19 5\' 5"  (1.651 m)    Weight:   Wt Readings from Last 10 Encounters:  12/24/19 70 kg  11/12/19 70.1 kg  10/29/19 67.6 kg  10/23/19 68.5 kg  10/08/19 68.7 kg  10/05/19 69.1 kg  09/23/19 69.8 kg  09/18/19 72.6 kg  09/15/19 72.6 kg  08/10/19 74.4 kg    BMI:  Body mass index is 25.68 kg/m.  Estimated Nutritional Needs:   Kcal:  1600-1800  Protein:  80-90 grams  Fluid:  >/=1.6L/d    Larkin Ina, MS, RD, LDN RD pager number and weekend/on-call pager number located in [place].

## 2019-12-26 DIAGNOSIS — Z8781 Personal history of (healed) traumatic fracture: Secondary | ICD-10-CM

## 2019-12-26 LAB — COMPREHENSIVE METABOLIC PANEL
ALT: 12 U/L (ref 0–44)
AST: 22 U/L (ref 15–41)
Albumin: 2.5 g/dL — ABNORMAL LOW (ref 3.5–5.0)
Alkaline Phosphatase: 56 U/L (ref 38–126)
Anion gap: 6 (ref 5–15)
BUN: 9 mg/dL (ref 8–23)
CO2: 25 mmol/L (ref 22–32)
Calcium: 7.9 mg/dL — ABNORMAL LOW (ref 8.9–10.3)
Chloride: 96 mmol/L — ABNORMAL LOW (ref 98–111)
Creatinine, Ser: <0.3 mg/dL — ABNORMAL LOW (ref 0.44–1.00)
Glucose, Bld: 157 mg/dL — ABNORMAL HIGH (ref 70–99)
Potassium: 4.1 mmol/L (ref 3.5–5.1)
Sodium: 127 mmol/L — ABNORMAL LOW (ref 135–145)
Total Bilirubin: 1.7 mg/dL — ABNORMAL HIGH (ref 0.3–1.2)
Total Protein: 5 g/dL — ABNORMAL LOW (ref 6.5–8.1)

## 2019-12-26 LAB — CBC
HCT: 36.5 % (ref 36.0–46.0)
Hemoglobin: 11.9 g/dL — ABNORMAL LOW (ref 12.0–15.0)
MCH: 33.2 pg (ref 26.0–34.0)
MCHC: 32.6 g/dL (ref 30.0–36.0)
MCV: 102 fL — ABNORMAL HIGH (ref 80.0–100.0)
Platelets: 231 10*3/uL (ref 150–400)
RBC: 3.58 MIL/uL — ABNORMAL LOW (ref 3.87–5.11)
RDW: 16 % — ABNORMAL HIGH (ref 11.5–15.5)
WBC: 6.6 10*3/uL (ref 4.0–10.5)
nRBC: 0 % (ref 0.0–0.2)

## 2019-12-26 MED ORDER — LIP MEDEX EX OINT
TOPICAL_OINTMENT | CUTANEOUS | Status: AC
  Filled 2019-12-26: qty 7

## 2019-12-26 NOTE — Progress Notes (Signed)
PROGRESS NOTE    [name]  GMW:[id number] DOB: 12/01/1929 DOA: 12/24/2019 PCP: Colon Branch, MD   Brief Narrative: [name] is a 86 y.o. female past medical history of coronary artery disease Takotsubo cardiomyopathy in 4403, chronic diastolic heart failure CVA and hypertension who was recently discharged from the hospital on 11/12/2019 for an intracranial hemorrhage and acute and cephalopathy due to mechanical fall comes in again for a fall that happened on the day of admission she relates she slipped and fell on her left hip she did not hit her head, she denies any chest pain shortness of breath nausea vomiting lightheadedness double vision or change in vision he did develop sudden left-sided pain related with movement.  Assessment & Plan:   Active Problems:   ANXIETY DEPRESSION   Essential hypertension   Senile osteoporosis   Chronic diastolic CHF (congestive heart failure) (HCC)   ICH (intracerebral hemorrhage) (HCC)   Comminuted fracture of hip, left, open type I or II, initial encounter ([place])   Closed left hip fracture ([place])   #1 left hip fracture s/p  ORIF 8/13.  Continue pain control.  DVT prophylaxis per Ortho.  Continue stool softeners.  #2 history of essential hypertension blood pressure 143/71 improved after restarting Lopressor 50 mg twice a day.    #3 history of anxiety and depression not on any medications at home  #4 history of chronic diastolic CHF currently stable and appears dry continue slow IV fluids.  #5 history of intracranial hemorrhage stable  #6 history of seizure disorder on Keppra    Pressure Injury 12/24/19 Buttocks Stage 1 -  Intact skin with non-blanchable redness of a localized area usually over a bony prominence. (Active)  12/24/19 1640  Location: Buttocks  Location Orientation:   Staging: Stage 1 -  Intact skin with non-blanchable redness of a localized area usually over a bony prominence.  Wound Description (Comments):     Present on Admission: Yes    Estimated body mass index is 25.68 kg/m as calculated from the following:   Height as of this encounter: 5\' 5"  (1.651 m).   Weight as of this encounter: 70 kg.  DVT prophylaxis: SCD/aspirin Code Status: Full code Family Communication: None at bedside  disposition Plan:  Status is: Inpatient  Dispo: The patient is from: Home              Anticipated d/c is to: SNF await PT evaluation              Anticipated d/c date is: Unknown              Patient currently is not medically stable to d/c.  Await PT evaluation.  Patient admitted with left hip fracture  Consultants:  Ortho Procedures: None Antimicrobials: None  Subjective:  Patient resting in bed Foley is out today she is trying to order breakfast awake alert no nausea vomiting no new complaints other than left hip pain with movement had a bowel movement yesterday Objective: Vitals:   12/25/19 2201 12/26/19 0200 12/26/19 0539 12/26/19 0848  BP: 134/74 (!) 153/76 124/69 (!) 143/71  Pulse: 83 77 77 74  Resp: 16 14 14    Temp: 97.9 F (36.6 C) 98.3 F (36.8 C) 97.8 F (36.6 C)   TempSrc: Oral Oral Oral   SpO2: 99% 100% 95%   Weight:      Height:        Intake/Output Summary (Last 24 hours) at 12/26/2019 1000 Last data filed  at 12/26/2019 0600 Gross per 24 hour  Intake 1189.94 ml  Output 1525 ml  Net -335.06 ml   Filed Weights   12/24/19 0805  Weight: 70 kg    Examination:  General exam: Appears calm and comfortable  Respiratory system: Clear to auscultation. Respiratory effort normal. Cardiovascular system: S1 & S2 heard, RRR. No JVD, murmurs, rubs, gallops or clicks. No pedal edema. Gastrointestinal system: Abdomen is nondistended, soft and nontender. No organomegaly or masses felt. Normal bowel sounds heard. Central nervous system: Alert and oriented. No focal neurological deficits. Extremities: Left hip tenderness incision covered with dressing clean dry intact skin: No rashes,  lesions or ulcers Psychiatry: Judgement and insight appear normal. Mood & affect appropriate.     Data Reviewed: I have personally reviewed following labs and imaging studies  CBC: Recent Labs  Lab 12/24/19 0813 12/25/19 0313 12/26/19 0252  WBC 8.8 6.0 6.6  NEUTROABS 7.8*  --   --   HGB 14.2 11.9* 11.9*  HCT 44.3 38.0 36.5  MCV 101.4* 104.1* 102.0*  PLT 279 240 761   Basic Metabolic Panel: Recent Labs  Lab 12/24/19 0813 12/25/19 0313 12/26/19 0252  NA 136 136 127*  K 3.9 3.9 4.1  CL 99 103 96*  CO2 28 28 25   GLUCOSE 162* 126* 157*  BUN 9 12 9   CREATININE 0.42* 0.37* <0.30*  CALCIUM 8.9 8.3* 7.9*   GFR: CrCl cannot be calculated (This lab value cannot be used to calculate CrCl because it is not a number: <0.30). Liver Function Tests: Recent Labs  Lab 12/24/19 0813 12/26/19 0252  AST 22 22  ALT 16 12  ALKPHOS 74 56  BILITOT 1.8* 1.7*  PROT 5.8* 5.0*  ALBUMIN 3.3* 2.5*   No results for input(s): LIPASE, AMYLASE in the last 168 hours. No results for input(s): AMMONIA in the last 168 hours. Coagulation Profile: Recent Labs  Lab 12/24/19 1813  INR 1.1   Cardiac Enzymes: No results for input(s): CKTOTAL, CKMB, CKMBINDEX, TROPONINI in the last 168 hours. BNP (last 3 results) No results for input(s): PROBNP in the last 8760 hours. HbA1C: No results for input(s): HGBA1C in the last 72 hours. CBG: No results for input(s): GLUCAP in the last 168 hours. Lipid Profile: No results for input(s): CHOL, HDL, LDLCALC, TRIG, CHOLHDL, LDLDIRECT in the last 72 hours. Thyroid Function Tests: No results for input(s): TSH, T4TOTAL, FREET4, T3FREE, THYROIDAB in the last 72 hours. Anemia Panel: No results for input(s): VITAMINB12, FOLATE, FERRITIN, TIBC, IRON, RETICCTPCT in the last 72 hours. Sepsis Labs: No results for input(s): PROCALCITON, LATICACIDVEN in the last 168 hours.  Recent Results (from the past 240 hour(s))  SARS Coronavirus 2 by RT PCR (hospital order,  performed in [facility] hospital lab) Nasopharyngeal Nasopharyngeal Swab     Status: None   Collection Time: 12/24/19  8:14 AM   Specimen: Nasopharyngeal Swab  Result Value Ref Range Status   SARS Coronavirus 2 NEGATIVE NEGATIVE Final    Comment: (NOTE) SARS-CoV-2 target nucleic acids are NOT DETECTED.  The SARS-CoV-2 RNA is generally detectable in upper and lower respiratory specimens during the acute phase of infection. The lowest concentration of SARS-CoV-2 viral copies this assay can detect is 250 copies / mL. A negative result does not preclude SARS-CoV-2 infection and should not be used as the sole basis for treatment or other patient management decisions.  A negative result may occur with improper specimen collection / handling, submission of specimen other than nasopharyngeal swab, presence of  viral mutation(s) within the areas targeted by this assay, and inadequate number of viral copies (<250 copies / mL). A negative result must be combined with clinical observations, patient history, and epidemiological information.  Fact Sheet for Patients:   StrictlyIdeas.no  Fact Sheet for Healthcare Providers: BankingDealers.co.za  This test is not yet approved or  cleared by the [place] FDA and has been authorized for detection and/or diagnosis of SARS-CoV-2 by FDA under an Emergency Use Authorization (EUA).  This EUA will remain in effect (meaning this test can be used) for the duration of the COVID-19 declaration under Section 564(b)(1) of the Act, 21 U.S.C. section 360bbb-3(b)(1), unless the authorization is terminated or revoked sooner.  Performed at [facility], [place] [address] [place], [place] [address]   Surgical pcr screen     Status: None   Collection Time: 12/24/19  6:22 PM   Specimen: Nasal Mucosa; Nasal Swab  Result Value Ref Range Status   MRSA, PCR NEGATIVE NEGATIVE Final   Staphylococcus  aureus NEGATIVE NEGATIVE Final    Comment: (NOTE) The Xpert SA Assay (FDA approved for NASAL specimens in patients 52 years of age and older), is one component of a comprehensive surveillance program. It is not intended to diagnose infection nor to guide or monitor treatment. Performed at [facility], [place] [address] [place], [place] [address]          Radiology Studies: DG C-Arm 1-60 Min-No Report  Result Date: 12/25/2019 Fluoroscopy was utilized by the requesting physician.  No radiographic interpretation.   DG HIP OPERATIVE UNILAT WITH PELVIS LEFT  Result Date: 12/25/2019 CLINICAL DATA:  IM nail EXAM: OPERATIVE LEFT HIP (WITH PELVIS IF PERFORMED) 2 VIEWS TECHNIQUE: Fluoroscopic spot image(s) were submitted for interpretation post-operatively. COMPARISON:  12/24/2019 FINDINGS: Placement of IM nail and dynamic hip screw across the proximal left femoral fracture. No hardware complicating feature. Anatomic alignment. IMPRESSION: Internal fixation.  No visible complicating feature. Electronically Signed   By: Rolm Baptise M.D.   On: 12/25/2019 17:42        Scheduled Meds: . aspirin EC  81 mg Oral BID  . Chlorhexidine Gluconate Cloth  6 each Topical Daily  . docusate sodium  100 mg Oral BID  . feeding supplement (ENSURE ENLIVE)  237 mL Oral BID BM  . ferrous sulfate  325 mg Oral BID WC  . hydroxyurea  500 mg Oral QODAY  . levETIRAcetam  500 mg Oral BID  . LORazepam  0.25 mg Oral BID WC  . metoprolol tartrate  50 mg Oral BID  . multivitamin with minerals  1 tablet Oral Daily  . pantoprazole  40 mg Oral Daily  . polyethylene glycol  17 g Oral Daily   Continuous Infusions: . sodium chloride 50 mL/hr at 12/26/19 0600  . methocarbamol (ROBAXIN) IV Stopped (12/25/19 1811)     LOS: 2 days     Georgette Shell, MD 12/26/2019, 10:00 AM

## 2019-12-26 NOTE — Op Note (Signed)
NAME: [name], [name] [employer] MEDICAL RECORD SP:[id number] ACCOUNT [id number] DATE OF BIRTH:08/01/1929 FACILITY: [facility] LOCATION: [id number] PHYSICIAN:Bastien Strawser Marian Sorrow, MD  OPERATIVE REPORT  DATE OF PROCEDURE:  12/25/2019  PREOPERATIVE DIAGNOSIS:  Left intertrochanteric femur fracture.  POSTOPERATIVE DIAGNOSIS:  Left intertrochanteric femur fracture.  PROCEDURE:  Open reduction internal fixation of left hip fracture utilizing intramedullary hip screw nail.  COMPONENTS USED:  A Biomet Affixus nail, 11 mm x180 mm with a 105 mm lag screw and a distal interlock.  SURGEON:  Paralee Cancel, MD  ASSISTANT:  Griffith Citron PA-C.  Note that Ms. Nehemiah Settle was present for the entirety of the case from preoperative position, perioperative management of the operative extremity, general facilitation of the case and primary wound closure.  ANESTHESIA:  General.  ESTIMATED BLOOD LOSS:  About 100 mL.  COMPLICATIONS:  None apparent.  DRAINS:  None.  INDICATIONS:  The patient is an 86 year old female who lives independently.  She unfortunately had a ground level fall landing on her left hip.  She had immediate onset of pain.  She was brought to the emergency room where radiographs revealed a left  intertrochanteric femur fracture.  Per protocol, she was admitted to the medical service for optimization prior to surgery.  Risks and benefits and necessity of the procedure were reviewed.  Specific risks of infection, nonunion, malunion, need for  future surgeries were reviewed.  The benefit  of procedure was discussed in terms of fracture management healing.  Consent was obtained.  DESCRIPTION OF PROCEDURE:  The patient was brought to the operative theater.  Once adequate anesthesia, preoperative antibiotics, vancomycin due to a PENICILLIN allergy, she was positioned supine on the Hana table.  All bony prominences were carefully  padded during positioning.  The left hip was then imaged under fluoroscopy with  traction and internal rotation.  The fracture appeared to be lined up nearly anatomic.  Given these findings, the left hip was then prepped and draped in sterile fashion.  A  timeout was performed identifying the patient, the planned procedure and extremity.  An incision was then made 2 cm proximal to the trochanter laterally.  Fluoroscopy was used to confirm the orientation of this.  A guidewire was then inserted into the  tip of the trochanter and passed across the fracture site again confirmed radiographically.  Once this was in place, the starting drill was used to open up the proximal femur.  400 mm nail was then passed by hand across the fracture site.  Once it was at  its appropriate depth.  We used the insertion jig and the guide wire guide for the lag screw.  The guidewire was then positioned into the center of the femoral head in the AP and lateral planes using fluoroscopy to confirm.  Once this was in position,  we measured and selected a 105 mm lag screw.  The femoral head and neck was drilled for the lag screw.  Lag screw was then inserted.  Traction was let off the lower extremity and the traction wheel was used to medialize the shaft to the fracture site.  I  then chose to lock this down and backed it off a quarter of a turn to allow for further compression.  Once this was done, the lag screw insertion guide was removed and the guide for the distal interlock was positioned through the static position on the  insertion jig.  The drill holes were made.  We selected the 42 mm screw.  The screw was in  position without complication.  The jig was then removed.  Final radiographs proximal hip are showing the intramedullary nail in place with an anatomically reduced  fracture.  The wounds were irrigated with normal saline solution.  The proximal wound was closed in layers with #1 Vicryl on the gluteal fascia, 2-0 Vicryl and a running Monocryl.  The 2 remaining wounds distally were closed with 2-0  Vicryl.  All wounds  were then cleaned, dried and dressed sterilely using surgical glue and Aquacel dressing.  She was then brought to the recovery room in stable condition, tolerating the procedure well.  Findings were reviewed with family.  Postoperatively, we will have  her be partial weightbearing with followup with [place] in 2 weeks for wound evaluation and radiographic followup.  PN/NUANCE  D:12/25/2019 T:12/26/2019 JOB:[id number]

## 2019-12-26 NOTE — Progress Notes (Signed)
    Subjective:  Patient reports pain as mild to moderate.  Denies N/V/CP/SOB. Patient is resting in bed eating breakfast  Objective:   VITALS:   Vitals:   12/25/19 2201 12/26/19 0200 12/26/19 0539 12/26/19 0848  BP: 134/74 (!) 153/76 124/69 (!) 143/71  Pulse: 83 77 77 74  Resp: 16 14 14    Temp: 97.9 F (36.6 C) 98.3 F (36.8 C) 97.8 F (36.6 C)   TempSrc: Oral Oral Oral   SpO2: 99% 100% 95%   Weight:      Height:        NAD ABD soft Neurovascular intact Sensation intact distally Intact pulses distally Dorsiflexion/Plantar flexion intact Incision: dressing C/D/I   Lab Results  Component Value Date   WBC 6.6 12/26/2019   HGB 11.9 (L) 12/26/2019   HCT 36.5 12/26/2019   MCV 102.0 (H) 12/26/2019   PLT 231 12/26/2019   BMET    Component Value Date/Time   NA 127 (L) 12/26/2019 0252   NA 142 03/25/2017 1457   NA 139 04/10/2016 0855   K 4.1 12/26/2019 0252   K 4.1 03/25/2017 1457   K 4.2 04/10/2016 0855   CL 96 (L) 12/26/2019 0252   CL 96 (L) 03/25/2017 1457   CO2 25 12/26/2019 0252   CO2 32 03/25/2017 1457   CO2 29 04/10/2016 0855   GLUCOSE 157 (H) 12/26/2019 0252   GLUCOSE 109 03/25/2017 1457   BUN 9 12/26/2019 0252   BUN 7 03/25/2017 1457   BUN 10.0 04/10/2016 0855   CREATININE <0.30 (L) 12/26/2019 0252   CREATININE 0.73 10/29/2019 1355   CREATININE 1.0 03/25/2017 1457   CREATININE 0.8 04/10/2016 0855   CALCIUM 7.9 (L) 12/26/2019 0252   CALCIUM 9.8 03/25/2017 1457   CALCIUM 9.5 04/10/2016 0855   GFRNONAA NOT CALCULATED 12/26/2019 0252   GFRNONAA >60 10/29/2019 1355   GFRAA NOT CALCULATED 12/26/2019 0252   GFRAA >60 10/29/2019 1355     Assessment/Plan: 1 Day Post-Op   Active Problems:   ANXIETY DEPRESSION   Essential hypertension   Senile osteoporosis   Chronic diastolic CHF (congestive heart failure) (HCC)   ICH (intracerebral hemorrhage) (HCC)   Comminuted fracture of hip, left, open type I or II, initial encounter ([place])   Closed left  hip fracture (HCC)   WBAT with walker DVT ppx: Aspirin, SCDs, TEDS PO pain control PT/OT Dispo: Anticipate D/C to SNF     Dorothyann Peng 12/26/2019, 9:21 AM  [facility] now [facility] [address], [place], [place], [place] [address] Phone: [phone] [email or web address] Facebook  [employer]

## 2019-12-26 NOTE — Evaluation (Addendum)
Physical Therapy Evaluation Patient Details Name: [name] MRN: [id number] DOB: 1929-07-17 Today's Date: 12/26/2019   History of Present Illness  86 yo female admitted with L hip fx s/p IM nail 12/25/19. Hx of falls, ICH, T4+T9 comp fx s/p kyphoplasties in May/June of 2021, CVA, CAD, breast ca, Sz, Dvt  Clinical Impression  On eval, pt required Max +2 assist for mobility. She was able to stand and move along the side of the bed with use of a RW. Moderate-severe pain with activity. Pt declined transfer to recliner so assisted her back to bed. Will plan to follow and progress activity as able during hospital stay.     Follow Up Recommendations SNF    Equipment Recommendations  None recommended by PT    Recommendations for Other Services       Precautions / Restrictions Precautions Precautions: Fall Restrictions Weight Bearing Restrictions: Yes LLE Partial Weight Bearing Percentage or Pounds: PWB       Mobility  Bed Mobility Overal bed mobility: Needs Assistance Bed Mobility: Supine to Sit;Sit to Supine     Supine to sit: Max assist;+2 for physical assistance;+2 for safety/equipment;HOB elevated Sit to supine: Max assist;+2 for physical assistance;+2 for safety/equipment;HOB elevated   General bed mobility comments: Assist for trunk and bil LEs. Utilized bed for scooting, positioning. Increased time. Cues for safety, technique.  Transfers Overall transfer level: Needs assistance Equipment used: Rolling walker (2 wheeled) Transfers: Sit to/from Stand Sit to Stand: Mod assist;+2 physical assistance;+2 safety/equipment;From elevated surface         General transfer comment: VCs safety, technique, hand placement. Assist to power up, steady, control descent. Increased time.  Ambulation/Gait Ambulation/Gait assistance: Min assist;+2 physical assistance;+2 safety/equipment   Assistive device: Rolling walker (2 wheeled)       General Gait Details: Pt was ablet  to perform heel-toe pivoting on R foot and able to move L LE in order to move along side of the bed. She declined attempting transfer to recliner and ambulation on today.  Stairs            Wheelchair Mobility    Modified Rankin (Stroke Patients Only)       Balance Overall balance assessment: Needs assistance;History of Falls         Standing balance support: Bilateral upper extremity supported Standing balance-Leahy Scale: Poor                               Pertinent Vitals/Pain Pain Assessment: 0-10 Pain Score: 8  Pain Location: L hip with activity Pain Descriptors / Indicators: Grimacing;Guarding;Sharp Pain Intervention(s): Limited activity within patient's tolerance;Monitored during session;Repositioned    Home Living Family/patient expects to be discharged to:: Unsure   Available Help at Discharge: Available PRN/intermittently Type of Home: Assisted living Home Access: Level entry     Home Layout: One level Home Equipment: Cane - quad;Shower seat;Walker - 2 wheels      Prior Function Level of Independence: Independent with assistive device(s)         Comments: Uses quad cane vs RW for mobility, independent ADLs, IADls and driving     Hand Dominance        Extremity/Trunk Assessment   Upper Extremity Assessment Upper Extremity Assessment: Generalized weakness    Lower Extremity Assessment Lower Extremity Assessment: Generalized weakness;LLE deficits/detail LLE: Unable to fully assess due to pain    Cervical / Trunk Assessment Cervical / Trunk Assessment: Kyphotic  Communication   Communication: HOH  Cognition Arousal/Alertness: Awake/alert Behavior During Therapy: WFL for tasks assessed/performed Overall Cognitive Status: Within Functional Limits for tasks assessed                                        General Comments      Exercises     Assessment/Plan    PT Assessment Patient needs continued PT  services  PT Problem List Decreased strength;Decreased range of motion;Decreased mobility;Decreased activity tolerance;Decreased balance;Decreased knowledge of use of DME;Pain       PT Treatment Interventions DME instruction;Gait training;Therapeutic activities;Therapeutic exercise;Patient/family education;Balance training;Functional mobility training    PT Goals (Current goals can be found in the Care Plan section)  Acute Rehab PT Goals Patient Stated Goal: less pain PT Goal Formulation: With patient Time For Goal Achievement: 01/09/20 Potential to Achieve Goals: Good    Frequency Min 3X/week   Barriers to discharge        Co-evaluation               AM-PAC PT "6 Clicks" Mobility  Outcome Measure Help needed turning from your back to your side while in a flat bed without using bedrails?: A Lot Help needed moving from lying on your back to sitting on the side of a flat bed without using bedrails?: A Lot Help needed moving to and from a bed to a chair (including a wheelchair)?: A Lot Help needed standing up from a chair using your arms (e.g., wheelchair or bedside chair)?: A Lot Help needed to walk in hospital room?: Total Help needed climbing 3-5 steps with a railing? : Total 6 Click Score: 10    End of Session Equipment Utilized During Treatment: Gait belt Activity Tolerance: Patient limited by fatigue;Patient limited by pain Patient left: in bed;with call bell/phone within reach;with bed alarm set   PT Visit Diagnosis: Muscle weakness (generalized) (M62.81);Pain;History of falling (Z91.81);Repeated falls (R29.6);Other abnormalities of gait and mobility (R26.89) Pain - Right/Left: Left Pain - part of body: Hip    Time: 3235-5732 PT Time Calculation (min) (ACUTE ONLY): 26 min   Charges:   PT Evaluation $PT Eval Low Complexity: 1 Low PT Treatments $Gait Training: 8-22 mins           Doreatha Massed, PT Acute Rehabilitation  Office: [phone] Pager:  [phone]

## 2019-12-27 DIAGNOSIS — L8990 Pressure ulcer of unspecified site, unspecified stage: Secondary | ICD-10-CM | POA: Insufficient documentation

## 2019-12-27 DIAGNOSIS — E440 Moderate protein-calorie malnutrition: Secondary | ICD-10-CM | POA: Insufficient documentation

## 2019-12-27 LAB — BASIC METABOLIC PANEL
Anion gap: 5 (ref 5–15)
BUN: 8 mg/dL (ref 8–23)
CO2: 27 mmol/L (ref 22–32)
Calcium: 8.2 mg/dL — ABNORMAL LOW (ref 8.9–10.3)
Chloride: 101 mmol/L (ref 98–111)
Creatinine, Ser: <0.3 mg/dL — ABNORMAL LOW (ref 0.44–1.00)
Glucose, Bld: 105 mg/dL — ABNORMAL HIGH (ref 70–99)
Potassium: 3.2 mmol/L — ABNORMAL LOW (ref 3.5–5.1)
Sodium: 133 mmol/L — ABNORMAL LOW (ref 135–145)

## 2019-12-27 LAB — CBC
HCT: 31.2 % — ABNORMAL LOW (ref 36.0–46.0)
Hemoglobin: 10.3 g/dL — ABNORMAL LOW (ref 12.0–15.0)
MCH: 33.6 pg (ref 26.0–34.0)
MCHC: 33 g/dL (ref 30.0–36.0)
MCV: 101.6 fL — ABNORMAL HIGH (ref 80.0–100.0)
Platelets: 224 10*3/uL (ref 150–400)
RBC: 3.07 MIL/uL — ABNORMAL LOW (ref 3.87–5.11)
RDW: 16.3 % — ABNORMAL HIGH (ref 11.5–15.5)
WBC: 5.8 10*3/uL (ref 4.0–10.5)
nRBC: 0 % (ref 0.0–0.2)

## 2019-12-27 MED ORDER — POTASSIUM CHLORIDE CRYS ER 20 MEQ PO TBCR
20.0000 meq | EXTENDED_RELEASE_TABLET | Freq: Once | ORAL | Status: AC
  Administered 2019-12-27: 20 meq via ORAL
  Filled 2019-12-27: qty 1

## 2019-12-27 NOTE — Plan of Care (Signed)
  Problem: Clinical Measurements: Goal: Cardiovascular complication will be avoided Outcome: Progressing   Problem: Activity: Goal: Risk for activity intolerance will decrease Outcome: Progressing   Problem: Coping: Goal: Level of anxiety will decrease Outcome: Progressing   Problem: Elimination: Goal: Will not experience complications related to bowel motility Outcome: Progressing   Problem: Skin Integrity: Goal: Risk for impaired skin integrity will decrease Outcome: Progressing

## 2019-12-27 NOTE — Progress Notes (Signed)
Subjective: 2 Days Post-Op Procedure(s) (LRB): INTRAMEDULLARY (IM) NAIL INTERTROCHANTRIC (Left)  Patient reports pain as mild to moderate.  Resting comfortably in bed.  Denies fever, chills, N/V, CP, SOB.  Reports that she is ready for breakfast.  Objective:   VITALS:  Temp:  [97.4 F (36.3 C)-98.4 F (36.9 C)] 98 F (36.7 C) (08/15 0623) Pulse Rate:  [71-80] 71 (08/15 0623) Resp:  [16-18] 16 (08/15 0623) BP: (115-143)/(54-82) 135/71 (08/15 0623) SpO2:  [93 %-99 %] 94 % (08/15 4695)  General: WDWN patient in NAD. Psych:  Appropriate mood and affect. Neuro:  A&O x 3, Moving all extremities, sensation intact to light touch HEENT:  EOMs intact Chest:  Even non-labored respirations Skin: Aquacel dressing C/D/I, no rashes or lesions Extremities: warm/dry, mild edema to L hip, no erythema or echymosis.  No lymphadenopathy. Pulses: Popliteus 2+ MSK:  ROM: TKE, MMT: able to perform quad set, (-) Homan's    LABS Recent Labs    12/25/19 0313 12/25/19 0313 12/26/19 0252 12/27/19 0307  HGB 11.9*  --  11.9* 10.3*  WBC 6.0   < > 6.6 5.8  PLT 240   < > 231 224   < > = values in this interval not displayed.   Recent Labs    12/26/19 0252 12/27/19 0307  NA 127* 133*  K 4.1 3.2*  CL 96* 101  CO2 25 27  BUN 9 8  CREATININE <0.30* <0.30*  GLUCOSE 157* 105*   Recent Labs    12/24/19 1813  INR 1.1     Assessment/Plan: 2 Days Post-Op Procedure(s) (LRB): INTRAMEDULLARY (IM) NAIL INTERTROCHANTRIC (Left)  Patient seen in rounds for Dr. Alvan Dame PWB L LE with rolling walker Up with therapy Disp: SNF Plan for 2 week outpatient post-op visit with Dr. Alvan Dame.  Mechele Claude PA-C [facility] Office:  [phone]

## 2019-12-27 NOTE — Progress Notes (Signed)
Observed Potassium level 8/12 to 8/15 : 3.9,>3.9,>4.1,>3.2 with no obvious reason.  Requested that Lab re-run test.  New value 3.3 from same sample on different machine.  Patient able to take PO meds. Will Inform On-Call.  Thank you.

## 2019-12-27 NOTE — Progress Notes (Signed)
Pt stable at time of rounding. No needs or s/s of pain.

## 2019-12-27 NOTE — Progress Notes (Signed)
PROGRESS NOTE    [name]  IRC:[id number] DOB: [birth year] DOA: 12/24/2019 PCP: Colon Branch, MD   Brief Narrative: [name] is a 86 y.o. female past medical history of coronary artery disease Takotsubo cardiomyopathy in 5102, chronic diastolic heart failure CVA and hypertension who was recently discharged from the hospital on 11/12/2019 for an intracranial hemorrhage and acute and cephalopathy due to mechanical fall comes in again for a fall that happened on the day of admission she relates she slipped and fell on her left hip she did not hit her head, she denies any chest pain shortness of breath nausea vomiting lightheadedness double vision or change in vision he did develop sudden left-sided pain related with movement.  Assessment & Plan:   Active Problems:   ANXIETY DEPRESSION   Essential hypertension   Senile osteoporosis   Chronic diastolic CHF (congestive heart failure) (HCC)   ICH (intracerebral hemorrhage) (HCC)   Comminuted fracture of hip, left, open type I or II, initial encounter ([place])   Closed left hip fracture ([place])   S/p left hip fracture   Malnutrition of moderate degree   Pressure injury of skin   #1 left hip fracture s/p  ORIF 8/13.  Continue pain control.  On aspirin 81 mg twice a day per Ortho for DVT prophylaxis.  Continue stool softeners. Seen by PT recommending SNF.  Case manager consulted.  #2 history of essential hypertension blood pressure 135/71.  Continue Lopressor 50 mg twice a day.    #3 history of anxiety and depression not on any medications at home  #4 history of chronic diastolic CHF currently stable and appears dry continue slow IV fluids.  #5 history of intracranial hemorrhage stable  #6 history of seizure disorder on Keppra    Pressure Injury 12/24/19 Buttocks Stage 1 -  Intact skin with non-blanchable redness of a localized area usually over a bony prominence. (Active)  12/24/19 1640  Location: Buttocks  Location  Orientation:   Staging: Stage 1 -  Intact skin with non-blanchable redness of a localized area usually over a bony prominence.  Wound Description (Comments):   Present on Admission: Yes    Estimated body mass index is 25.68 kg/m as calculated from the following:   Height as of this encounter: 5\' 5"  (1.651 m).   Weight as of this encounter: 70 kg.  DVT prophylaxis: SCD/aspirin Code Status: Full code Family Communication: None at bedside  disposition Plan:  Status is: Inpatient  Dispo: The patient is from: Home              Anticipated d/c is to: SNF await PT evaluation              Anticipated d/c date is: Unknown              Patient currently is medically stable for discharge seen by physical therapy recommending SNF.  Consultants:  Ortho Procedures: None Antimicrobials: None  Subjective:  She is resting comfortably in bed no complaints reported  objective: Vitals:   12/26/19 1433 12/26/19 1806 12/26/19 2208 12/27/19 0623  BP: 122/64 (!) 138/54 115/61 135/71  Pulse: 80 79 77 71  Resp: 18 18  16   Temp: 98.1 F (36.7 C) 98.4 F (36.9 C) (!) 97.4 F (36.3 C) 98 F (36.7 C)  TempSrc: Oral Oral Oral Oral  SpO2: 95% 98% 93% 94%  Weight:      Height:        Intake/Output Summary (Last 24 hours)  at 12/27/2019 1206 Last data filed at 12/27/2019 0845 Gross per 24 hour  Intake 780 ml  Output 1875 ml  Net -1095 ml   Filed Weights   12/24/19 0805  Weight: 70 kg    Examination:  General exam: Appears calm and comfortable  Respiratory system: Clear to auscultation. Respiratory effort normal. Cardiovascular system: S1 & S2 heard, RRR. No JVD, murmurs, rubs, gallops or clicks. No pedal edema. Gastrointestinal system: Abdomen is nondistended, soft and nontender. No organomegaly or masses felt. Normal bowel sounds heard. Central nervous system: Alert and oriented. No focal neurological deficits. Extremities: Left hip tenderness incision covered with dressing clean dry  intact skin: No rashes, lesions or ulcers Psychiatry: Judgement and insight appear normal. Mood & affect appropriate.     Data Reviewed: I have personally reviewed following labs and imaging studies  CBC: Recent Labs  Lab 12/24/19 0813 12/25/19 0313 12/26/19 0252 12/27/19 0307  WBC 8.8 6.0 6.6 5.8  NEUTROABS 7.8*  --   --   --   HGB 14.2 11.9* 11.9* 10.3*  HCT 44.3 38.0 36.5 31.2*  MCV 101.4* 104.1* 102.0* 101.6*  PLT 279 240 231 659   Basic Metabolic Panel: Recent Labs  Lab 12/24/19 0813 12/25/19 0313 12/26/19 0252 12/27/19 0307  NA 136 136 127* 133*  K 3.9 3.9 4.1 3.2*  CL 99 103 96* 101  CO2 28 28 25 27   GLUCOSE 162* 126* 157* 105*  BUN 9 12 9 8   CREATININE 0.42* 0.37* <0.30* <0.30*  CALCIUM 8.9 8.3* 7.9* 8.2*   GFR: CrCl cannot be calculated (This lab value cannot be used to calculate CrCl because it is not a number: <0.30). Liver Function Tests: Recent Labs  Lab 12/24/19 0813 12/26/19 0252  AST 22 22  ALT 16 12  ALKPHOS 74 56  BILITOT 1.8* 1.7*  PROT 5.8* 5.0*  ALBUMIN 3.3* 2.5*   No results for input(s): LIPASE, AMYLASE in the last 168 hours. No results for input(s): AMMONIA in the last 168 hours. Coagulation Profile: Recent Labs  Lab 12/24/19 1813  INR 1.1   Cardiac Enzymes: No results for input(s): CKTOTAL, CKMB, CKMBINDEX, TROPONINI in the last 168 hours. BNP (last 3 results) No results for input(s): PROBNP in the last 8760 hours. HbA1C: No results for input(s): HGBA1C in the last 72 hours. CBG: No results for input(s): GLUCAP in the last 168 hours. Lipid Profile: No results for input(s): CHOL, HDL, LDLCALC, TRIG, CHOLHDL, LDLDIRECT in the last 72 hours. Thyroid Function Tests: No results for input(s): TSH, T4TOTAL, FREET4, T3FREE, THYROIDAB in the last 72 hours. Anemia Panel: No results for input(s): VITAMINB12, FOLATE, FERRITIN, TIBC, IRON, RETICCTPCT in the last 72 hours. Sepsis Labs: No results for input(s): PROCALCITON,  LATICACIDVEN in the last 168 hours.  Recent Results (from the past 240 hour(s))  SARS Coronavirus 2 by RT PCR (hospital order, performed in [facility] hospital lab) Nasopharyngeal Nasopharyngeal Swab     Status: None   Collection Time: 12/24/19  8:14 AM   Specimen: Nasopharyngeal Swab  Result Value Ref Range Status   SARS Coronavirus 2 NEGATIVE NEGATIVE Final    Comment: (NOTE) SARS-CoV-2 target nucleic acids are NOT DETECTED.  The SARS-CoV-2 RNA is generally detectable in upper and lower respiratory specimens during the acute phase of infection. The lowest concentration of SARS-CoV-2 viral copies this assay can detect is 250 copies / mL. A negative result does not preclude SARS-CoV-2 infection and should not be used as the sole basis for treatment  or other patient management decisions.  A negative result may occur with improper specimen collection / handling, submission of specimen other than nasopharyngeal swab, presence of viral mutation(s) within the areas targeted by this assay, and inadequate number of viral copies (<250 copies / mL). A negative result must be combined with clinical observations, patient history, and epidemiological information.  Fact Sheet for Patients:   StrictlyIdeas.no  Fact Sheet for Healthcare Providers: BankingDealers.co.za  This test is not yet approved or  cleared by the [place] FDA and has been authorized for detection and/or diagnosis of SARS-CoV-2 by FDA under an Emergency Use Authorization (EUA).  This EUA will remain in effect (meaning this test can be used) for the duration of the COVID-19 declaration under Section 564(b)(1) of the Act, 21 U.S.C. section 360bbb-3(b)(1), unless the authorization is terminated or revoked sooner.  Performed at [facility], [place] [address] [place], [place] [address]   Surgical pcr screen     Status: None   Collection Time: 12/24/19  6:22  PM   Specimen: Nasal Mucosa; Nasal Swab  Result Value Ref Range Status   MRSA, PCR NEGATIVE NEGATIVE Final   Staphylococcus aureus NEGATIVE NEGATIVE Final    Comment: (NOTE) The Xpert SA Assay (FDA approved for NASAL specimens in patients 47 years of age and older), is one component of a comprehensive surveillance program. It is not intended to diagnose infection nor to guide or monitor treatment. Performed at [facility], [place] [address], [place], [place] [address]          Radiology Studies: DG C-Arm 1-60 Min-No Report  Result Date: 12/25/2019 Fluoroscopy was utilized by the requesting physician.  No radiographic interpretation.   DG HIP OPERATIVE UNILAT WITH PELVIS LEFT  Result Date: 12/25/2019 CLINICAL DATA:  IM nail EXAM: OPERATIVE LEFT HIP (WITH PELVIS IF PERFORMED) 2 VIEWS TECHNIQUE: Fluoroscopic spot image(s) were submitted for interpretation post-operatively. COMPARISON:  12/24/2019 FINDINGS: Placement of IM nail and dynamic hip screw across the proximal left femoral fracture. No hardware complicating feature. Anatomic alignment. IMPRESSION: Internal fixation.  No visible complicating feature. Electronically Signed   By: Rolm Baptise M.D.   On: 12/25/2019 17:42        Scheduled Meds: . aspirin EC  81 mg Oral BID  . Chlorhexidine Gluconate Cloth  6 each Topical Daily  . docusate sodium  100 mg Oral BID  . feeding supplement (ENSURE ENLIVE)  237 mL Oral BID BM  . ferrous sulfate  325 mg Oral BID WC  . hydroxyurea  500 mg Oral QODAY  . levETIRAcetam  500 mg Oral BID  . LORazepam  0.25 mg Oral BID WC  . metoprolol tartrate  50 mg Oral BID  . multivitamin with minerals  1 tablet Oral Daily  . pantoprazole  40 mg Oral Daily  . polyethylene glycol  17 g Oral Daily   Continuous Infusions: . sodium chloride Stopped (12/26/19 0830)  . methocarbamol (ROBAXIN) IV Stopped (12/25/19 1811)     LOS: 3 days     Georgette Shell, MD 12/27/2019,  12:06 PM

## 2019-12-28 ENCOUNTER — Encounter (HOSPITAL_COMMUNITY): Payer: Self-pay | Admitting: Orthopedic Surgery

## 2019-12-28 DIAGNOSIS — I5181 Takotsubo syndrome: Secondary | ICD-10-CM | POA: Diagnosis not present

## 2019-12-28 DIAGNOSIS — I5032 Chronic diastolic (congestive) heart failure: Secondary | ICD-10-CM | POA: Diagnosis not present

## 2019-12-28 DIAGNOSIS — R52 Pain, unspecified: Secondary | ICD-10-CM | POA: Diagnosis not present

## 2019-12-28 DIAGNOSIS — L89301 Pressure ulcer of unspecified buttock, stage 1: Secondary | ICD-10-CM | POA: Diagnosis not present

## 2019-12-28 DIAGNOSIS — G40909 Epilepsy, unspecified, not intractable, without status epilepticus: Secondary | ICD-10-CM | POA: Diagnosis not present

## 2019-12-28 DIAGNOSIS — S72009A Fracture of unspecified part of neck of unspecified femur, initial encounter for closed fracture: Secondary | ICD-10-CM | POA: Diagnosis not present

## 2019-12-28 DIAGNOSIS — I7300 Raynaud's syndrome without gangrene: Secondary | ICD-10-CM | POA: Diagnosis not present

## 2019-12-28 DIAGNOSIS — M25552 Pain in left hip: Secondary | ICD-10-CM | POA: Diagnosis not present

## 2019-12-28 DIAGNOSIS — M810 Age-related osteoporosis without current pathological fracture: Secondary | ICD-10-CM | POA: Diagnosis not present

## 2019-12-28 DIAGNOSIS — M2550 Pain in unspecified joint: Secondary | ICD-10-CM | POA: Diagnosis not present

## 2019-12-28 DIAGNOSIS — S22070D Wedge compression fracture of T9-T10 vertebra, subsequent encounter for fracture with routine healing: Secondary | ICD-10-CM | POA: Diagnosis not present

## 2019-12-28 DIAGNOSIS — K219 Gastro-esophageal reflux disease without esophagitis: Secondary | ICD-10-CM | POA: Diagnosis not present

## 2019-12-28 DIAGNOSIS — M62838 Other muscle spasm: Secondary | ICD-10-CM | POA: Diagnosis not present

## 2019-12-28 DIAGNOSIS — Z8679 Personal history of other diseases of the circulatory system: Secondary | ICD-10-CM | POA: Diagnosis not present

## 2019-12-28 DIAGNOSIS — K5900 Constipation, unspecified: Secondary | ICD-10-CM | POA: Diagnosis not present

## 2019-12-28 DIAGNOSIS — G43909 Migraine, unspecified, not intractable, without status migrainosus: Secondary | ICD-10-CM | POA: Diagnosis not present

## 2019-12-28 DIAGNOSIS — S22040D Wedge compression fracture of fourth thoracic vertebra, subsequent encounter for fracture with routine healing: Secondary | ICD-10-CM | POA: Diagnosis not present

## 2019-12-28 DIAGNOSIS — F329 Major depressive disorder, single episode, unspecified: Secondary | ICD-10-CM | POA: Diagnosis not present

## 2019-12-28 DIAGNOSIS — S72002A Fracture of unspecified part of neck of left femur, initial encounter for closed fracture: Secondary | ICD-10-CM | POA: Diagnosis not present

## 2019-12-28 DIAGNOSIS — Z4789 Encounter for other orthopedic aftercare: Secondary | ICD-10-CM | POA: Diagnosis not present

## 2019-12-28 DIAGNOSIS — I619 Nontraumatic intracerebral hemorrhage, unspecified: Secondary | ICD-10-CM | POA: Diagnosis not present

## 2019-12-28 DIAGNOSIS — R0902 Hypoxemia: Secondary | ICD-10-CM | POA: Diagnosis not present

## 2019-12-28 DIAGNOSIS — C50919 Malignant neoplasm of unspecified site of unspecified female breast: Secondary | ICD-10-CM | POA: Diagnosis not present

## 2019-12-28 DIAGNOSIS — R112 Nausea with vomiting, unspecified: Secondary | ICD-10-CM | POA: Diagnosis not present

## 2019-12-28 DIAGNOSIS — S72142A Displaced intertrochanteric fracture of left femur, initial encounter for closed fracture: Secondary | ICD-10-CM | POA: Diagnosis not present

## 2019-12-28 DIAGNOSIS — S2242XD Multiple fractures of ribs, left side, subsequent encounter for fracture with routine healing: Secondary | ICD-10-CM | POA: Diagnosis not present

## 2019-12-28 DIAGNOSIS — D751 Secondary polycythemia: Secondary | ICD-10-CM | POA: Diagnosis not present

## 2019-12-28 DIAGNOSIS — I10 Essential (primary) hypertension: Secondary | ICD-10-CM | POA: Diagnosis not present

## 2019-12-28 DIAGNOSIS — D500 Iron deficiency anemia secondary to blood loss (chronic): Secondary | ICD-10-CM | POA: Diagnosis not present

## 2019-12-28 DIAGNOSIS — Z8781 Personal history of (healed) traumatic fracture: Secondary | ICD-10-CM | POA: Diagnosis not present

## 2019-12-28 DIAGNOSIS — Z7401 Bed confinement status: Secondary | ICD-10-CM | POA: Diagnosis not present

## 2019-12-28 DIAGNOSIS — W19XXXD Unspecified fall, subsequent encounter: Secondary | ICD-10-CM | POA: Diagnosis not present

## 2019-12-28 DIAGNOSIS — G9341 Metabolic encephalopathy: Secondary | ICD-10-CM | POA: Diagnosis not present

## 2019-12-28 DIAGNOSIS — R5381 Other malaise: Secondary | ICD-10-CM | POA: Diagnosis not present

## 2019-12-28 DIAGNOSIS — N390 Urinary tract infection, site not specified: Secondary | ICD-10-CM | POA: Diagnosis not present

## 2019-12-28 DIAGNOSIS — S72355D Nondisplaced comminuted fracture of shaft of left femur, subsequent encounter for closed fracture with routine healing: Secondary | ICD-10-CM | POA: Diagnosis not present

## 2019-12-28 DIAGNOSIS — F419 Anxiety disorder, unspecified: Secondary | ICD-10-CM | POA: Diagnosis not present

## 2019-12-28 LAB — CBC
HCT: 31.2 % — ABNORMAL LOW (ref 36.0–46.0)
Hemoglobin: 10.2 g/dL — ABNORMAL LOW (ref 12.0–15.0)
MCH: 33.8 pg (ref 26.0–34.0)
MCHC: 32.7 g/dL (ref 30.0–36.0)
MCV: 103.3 fL — ABNORMAL HIGH (ref 80.0–100.0)
Platelets: 236 10*3/uL (ref 150–400)
RBC: 3.02 MIL/uL — ABNORMAL LOW (ref 3.87–5.11)
RDW: 16.4 % — ABNORMAL HIGH (ref 11.5–15.5)
WBC: 4.8 10*3/uL (ref 4.0–10.5)
nRBC: 0 % (ref 0.0–0.2)

## 2019-12-28 LAB — SARS CORONAVIRUS 2 BY RT PCR (HOSPITAL ORDER, PERFORMED IN ~~LOC~~ HOSPITAL LAB): SARS Coronavirus 2: NEGATIVE

## 2019-12-28 MED ORDER — ADULT MULTIVITAMIN W/MINERALS CH: 1.0000 | ORAL_TABLET | Freq: Every day | ORAL | Status: DC

## 2019-12-28 MED ORDER — POTASSIUM CHLORIDE CRYS ER 20 MEQ PO TBCR
40.0000 meq | EXTENDED_RELEASE_TABLET | Freq: Once | ORAL | Status: AC
  Administered 2019-12-28: 40 meq via ORAL
  Filled 2019-12-28: qty 2

## 2019-12-28 MED ORDER — LORAZEPAM 0.5 MG PO TABS: 0.5000 mg | ORAL_TABLET | Freq: Every evening | ORAL | 0 refills | Status: DC | PRN

## 2019-12-28 MED ORDER — HYDROCODONE-ACETAMINOPHEN 5-325 MG PO TABS: 1.0000 | ORAL_TABLET | ORAL | 0 refills | Status: DC | PRN

## 2019-12-28 MED ORDER — FLEET ENEMA 7-19 GM/118ML RE ENEM
1.0000 | ENEMA | Freq: Once | RECTAL | Status: AC
  Administered 2019-12-28: 1 via RECTAL
  Filled 2019-12-28: qty 1

## 2019-12-28 MED ORDER — LORAZEPAM 0.5 MG PO TABS: 0.2500 mg | ORAL_TABLET | Freq: Two times a day (BID) | ORAL | 0 refills | Status: DC

## 2019-12-28 MED ORDER — ASPIRIN 81 MG PO TBEC: 81.0000 mg | DELAYED_RELEASE_TABLET | Freq: Two times a day (BID) | ORAL | 11 refills | Status: DC

## 2019-12-28 NOTE — TOC Transition Note (Signed)
Transition of Care [facility]) - CM/SW Discharge Note   Patient Details  Name: [name] MRN: [id number] Date of Birth: March 05, 1930  [facility] CM/SW Contact:  Lennart Pall, LCSW Phone Number: 12/28/2019, 1:02 PM   Clinical Narrative:    Pt medically cleared for SNF transfer.  Have received SNF bed offer from [place].  Pt and daughter have accepted with plan to transfer pt this afternoon via [facility].  MD and team aware.   Final next level of care: Skilled Nursing Facility Barriers to Discharge: Barriers Resolved   Patient Goals and CMS Choice Patient states their goals for this hospitalization and ongoing recovery are:: to eventually return to her [place]/ [place] apt CMS Medicare.gov Compare Post Acute Care list provided to:: Patient Choice offered to / list presented to : Patient, Adult Children  Discharge Placement              Patient chooses bed at: [place] Patient to be transferred to facility by: [place] Name of family member notified: daughter, [name] Patient and family notified of of transfer: 12/28/19  Discharge Plan and Services In-house Referral: Clinical Social Work              DME Arranged: N/A DME Agency: NA       HH Arranged: NA [place] Agency: NA        Social Determinants of Health (SDOH) Interventions     Readmission Risk Interventions Readmission Risk Prevention Plan 09/14/2019  Medication Screening Complete  Transportation Screening Complete  Some recent data might be hidden

## 2019-12-28 NOTE — Plan of Care (Signed)

## 2019-12-28 NOTE — Plan of Care (Signed)
  Problem: Education: Goal: Knowledge of General Education information will improve Description: Including pain rating scale, medication(s)/side effects and non-pharmacologic comfort measures 12/28/2019 1714 by Hubert Azure, RN Outcome: Adequate for Discharge 12/28/2019 1330 by Hubert Azure, RN Outcome: Progressing   Problem: Health Behavior/Discharge Planning: Goal: Ability to manage health-related needs will improve 12/28/2019 1714 by Hubert Azure, RN Outcome: Adequate for Discharge 12/28/2019 1330 by Hubert Azure, RN Outcome: Progressing   Problem: Clinical Measurements: Goal: Ability to maintain clinical measurements within normal limits will improve 12/28/2019 1714 by Hubert Azure, RN Outcome: Adequate for Discharge 12/28/2019 1330 by Hubert Azure, RN Outcome: Progressing Goal: Will remain free from infection 12/28/2019 1714 by Hubert Azure, RN Outcome: Adequate for Discharge 12/28/2019 1330 by Hubert Azure, RN Outcome: Progressing Goal: Diagnostic test results will improve Outcome: Adequate for Discharge Goal: Cardiovascular complication will be avoided Outcome: Adequate for Discharge   Problem: Activity: Goal: Risk for activity intolerance will decrease Outcome: Adequate for Discharge   Problem: Nutrition: Goal: Adequate nutrition will be maintained Outcome: Adequate for Discharge   Problem: Coping: Goal: Level of anxiety will decrease Outcome: Adequate for Discharge   Problem: Elimination: Goal: Will not experience complications related to bowel motility Outcome: Adequate for Discharge Goal: Will not experience complications related to urinary retention Outcome: Adequate for Discharge   Problem: Pain Managment: Goal: General experience of comfort will improve Outcome: Adequate for Discharge   Problem: Safety: Goal: Ability to remain free from injury will improve Outcome: Adequate for Discharge   Problem: Skin Integrity: Goal: Risk for  impaired skin integrity will decrease Outcome: Adequate for Discharge

## 2019-12-28 NOTE — Progress Notes (Signed)
Physical Therapy Treatment Patient Details Name: Verneice Caspers MRN: [id number] DOB: [birth year] Today's Date: 12/28/2019    History of Present Illness 86 yo female admitted with L hip fx s/p IM nail 12/25/19. Hx of falls, ICH, T4+T9 comp fx s/p kyphoplasties in May/June of 2021, CVA, CAD, breast ca, Sz, Dvt    PT Comments    POD # 3 Assisted OOB to [facility] then attempted gait.  General transfer comment: asissted from elevated bed to [facility] 1/4 pivot SPS then from [facility] to gait using EVA walker + 2 assist.  Pt VERY fearful. General Gait Details: Used B platform EVA walker this session to progress gait.  Pt was able to amb 3 feet with very small steps, ADd narrow BOS, on her "tippy toes" and VERY fearful of falling. Assisted back to bed per pt request and positioned to comfort.   Follow Up Recommendations  SNF     Equipment Recommendations  None recommended by PT    Recommendations for Other Services       Precautions / Restrictions Precautions Precautions: Fall Restrictions Weight Bearing Restrictions: Yes LLE Weight Bearing: Partial weight bearing    Mobility  Bed Mobility Overal bed mobility: Needs Assistance Bed Mobility: Supine to Sit;Sit to Supine     Supine to sit: Max assist;+2 for physical assistance;+2 for safety/equipment;HOB elevated Sit to supine: Max assist;+2 for physical assistance;+2 for safety/equipment;HOB elevated;Total assist   General bed mobility comments: Assist for trunk and bil LEs. Utilized bed for scooting, positioning. Increased time. Cues for safety, technique.  Assisted back to bed and positioned to comfort.  Transfers Overall transfer level: Needs assistance Equipment used: Bilateral platform walker;None (EVA walker) Transfers: Sit to/from Stand Sit to Stand: Mod assist;+2 physical assistance;+2 safety/equipment;From elevated surface         General transfer comment: asissted from elevated bed to [facility] 1/4 pivot SPS then from [facility] to gait  using EVA walker + 2 assist.  Pt VERY fearful.  Ambulation/Gait Ambulation/Gait assistance: Mod assist Gait Distance (Feet): 3 Feet Assistive device: Bilateral platform walker (EVA walker) Gait Pattern/deviations: Step-to pattern;Decreased step length - left Gait velocity: decreased   General Gait Details: Used B platform EVA walker this session to progress gait.  Pt was able to amb 3 feet with very small steps, ADd narrow BOS, on her "tippy toes" and VERY fearful of falling.   Stairs             Wheelchair Mobility    Modified Rankin (Stroke Patients Only)       Balance                                            Cognition Arousal/Alertness: Awake/alert Behavior During Therapy: WFL for tasks assessed/performed Overall Cognitive Status: Within Functional Limits for tasks assessed                                 General Comments: AxO x 2 very fearful      Exercises      General Comments        Pertinent Vitals/Pain Pain Assessment: Faces Faces Pain Scale: Hurts little more Pain Location: L hip with activity Pain Descriptors / Indicators: Grimacing;Guarding;Sharp Pain Intervention(s): Monitored during session;Premedicated before session;Repositioned    Home Living  Prior Function            PT Goals (current goals can now be found in the care plan section) Progress towards PT goals: Progressing toward goals    Frequency    Min 3X/week      PT Plan Current plan remains appropriate    Co-evaluation              AM-PAC PT "6 Clicks" Mobility   Outcome Measure  Help needed turning from your back to your side while in a flat bed without using bedrails?: Total Help needed moving from lying on your back to sitting on the side of a flat bed without using bedrails?: Total Help needed moving to and from a bed to a chair (including a wheelchair)?: Total Help needed standing up from a  chair using your arms (e.g., wheelchair or bedside chair)?: Total Help needed to walk in hospital room?: Total Help needed climbing 3-5 steps with a railing? : Total 6 Click Score: 6    End of Session Equipment Utilized During Treatment: Gait belt Activity Tolerance: Patient limited by fatigue;Patient limited by pain Patient left: in bed;with call bell/phone within reach;with bed alarm set Nurse Communication: Mobility status PT Visit Diagnosis: Muscle weakness (generalized) (M62.81);Pain;History of falling (Z91.81);Repeated falls (R29.6);Other abnormalities of gait and mobility (R26.89) Pain - Right/Left: Left Pain - part of body: Hip     Time: 8032-1224 PT Time Calculation (min) (ACUTE ONLY): 25 min  Charges:  $Gait Training: 8-22 mins $Therapeutic Activity: 8-22 mins                     Rica Koyanagi  PTA Acute  Rehabilitation Services Pager      [phone] Office      [phone]

## 2019-12-28 NOTE — Care Management Important Message (Signed)
Important Message  Patient Details IM Letter given to the Patient Name: [name] MRN: [id number] Date of Birth: 1929/08/16   Medicare Important Message Given:  Yes     Kerin Salen 12/28/2019, 1:15 PM

## 2019-12-28 NOTE — Progress Notes (Signed)
Subjective: 3 Days Post-Op Procedure(s) (LRB): INTRAMEDULLARY (IM) NAIL INTERTROCHANTRIC (Left) Patient reports pain as mild.   Patient seen in rounds for Dr. Alvan Dame. Patient is well, and has had no acute complaints or problems other than back pain. She is roughly 6 weeks s/p T4 vertebroplasty. She is preparing for discharge to [place].    Objective: Vital signs in last 24 hours: Temp:  [97.4 F (36.3 C)-98 F (36.7 C)] 98 F (36.7 C) (08/16 0536) Pulse Rate:  [75-81] 75 (08/16 0913) Resp:  [14-16] 16 (08/16 0536) BP: (117-158)/(60-85) 117/60 (08/16 0913) SpO2:  [90 %-95 %] 93 % (08/16 0913)  Intake/Output from previous day:  Intake/Output Summary (Last 24 hours) at 12/28/2019 1644 Last data filed at 12/28/2019 1030 Gross per 24 hour  Intake 1080 ml  Output 1250 ml  Net -170 ml     Intake/Output this shift: Total I/O In: -  Out: 400 [Urine:400]  Labs: Recent Labs    12/26/19 0252 12/27/19 0307 12/28/19 0325  HGB 11.9* 10.3* 10.2*   Recent Labs    12/27/19 0307 12/28/19 0325  WBC 5.8 4.8  RBC 3.07* 3.02*  HCT 31.2* 31.2*  PLT 224 236   Recent Labs    12/26/19 0252 12/27/19 0307  NA 127* 133*  K 4.1 3.2*  CL 96* 101  CO2 25 27  BUN 9 8  CREATININE <0.30* <0.30*  GLUCOSE 157* 105*  CALCIUM 7.9* 8.2*   No results for input(s): LABPT, INR in the last 72 hours.  Exam: General - Patient is Alert and Appropriate Extremity - Neurologically intact Sensation intact distally Intact pulses distally Dorsiflexion/Plantar flexion intact Dressing - dressing C/D/I Motor Function - intact, moving foot and toes well on exam.   Past Medical History:  Diagnosis Date  . CAD (coronary artery disease)    mild nonobstructive disease on cath in 2003  . Cancer ([place])   . Cardiomyopathy    Probable Takotsubo, severe CP w/ normal cath in 1994. Severe CP in 2003 w/ widespread T wave inversions on ECG. Cath w/ minimal coronary disease but LV-gram showed periapical  severe hypokinesis and basilar hyperkinesia (EF 40%). Last echo in 4/09 confirmed full LV functional recovery with EF 60%, no regional wall motion abnormalities, mild to moderate LVH.  Marland Kitchen Chronic diastolic CHF (congestive heart failure) ([place]) 09/13/2019  . CVA (cerebral infarction)   . Depression with anxiety 03-29-11   lost husband 3'09  . Diverticulitis   . DVT (deep venous thrombosis) ([place])    after venous ablation, R leg  . Dyspnea   . E. coli gastroenteritis 03-29-11   8'10  . Fracture 09/10/07   L2, status post vertebroplasty of L2 performed by IR  . GERD (gastroesophageal reflux disease)   . Headache(784.0)    migraines  . Hemorrhoids   . Hiatal hernia 03-29-11   no nerve problems  . Hyperlipidemia   . Hyperplastic colon polyp 06/2001  . Hypertension   . Iron deficiency anemia due to chronic blood loss 12/30/2017  . OA (osteoarthritis) of knee 03-29-11   w/ bilateral knee pain-not a problem now  . OSA (obstructive sleep apnea) 03-29-11   no cpap used, not a problem now.  . Osteoporosis   . Otalgia of both ears    Dr. Simeon Craft  . Polycythemia   . Stroke [facility]) 03-29-11   CVA x2 -last 10'12-?TIA(visual problems)  . Varicose vein     Assessment/Plan: 3 Days Post-Op Procedure(s) (LRB): INTRAMEDULLARY (IM) NAIL INTERTROCHANTRIC (Left) Active  Problems:   ANXIETY DEPRESSION   Essential hypertension   Senile osteoporosis   Chronic diastolic CHF (congestive heart failure) (HCC)   ICH (intracerebral hemorrhage) (HCC)   Comminuted fracture of hip, left, open type I or II, initial encounter ([place])   Closed left hip fracture (HCC)   S/p left hip fracture   Malnutrition of moderate degree   Pressure injury of skin  Estimated body mass index is 25.68 kg/m as calculated from the following:   Height as of this encounter: 5\' 5"  (1.651 m).   Weight as of this encounter: 70 kg. Advance diet Up with therapy D/C IV fluids  DVT Prophylaxis - Aspirin PWB LLE at 50%.   Plan is to go  Skilled nursing facility after hospital stay. Plan for discharge today to [place]. Follow up in the office in 2 weeks.  Griffith Citron, PA-C Orthopedic Surgery [phone] 12/28/2019, 4:44 PM

## 2019-12-28 NOTE — NC FL2 (Signed)
[place] LEVEL OF CARE SCREENING TOOL     IDENTIFICATION  Patient Name: [name] Birthdate: Nov 21, 1929 Sex: female Admission Date (Current Location): 12/24/2019  [facility] and [place] Number:  Herbalist and Address:  [facility],  [address] [place], [place]      Provider Number: [id number]  Attending Physician Name and Address:  Georgette Shell, MD  Relative Name and Phone Number:  daughter, [name] @ [phone]    Current Level of Care: Hospital Recommended Level of Care: [facility] Prior Approval Number:    Date Approved/Denied:   PASRR Number: [id number]  Discharge Plan: SNF    Current Diagnoses: Patient Active Problem List   Diagnosis Date Noted  . Malnutrition of moderate degree 12/27/2019  . Pressure injury of skin 12/27/2019  . S/p left hip fracture   . Comminuted fracture of hip, left, open type I or II, initial encounter ([place]) 12/24/2019  . Closed left hip fracture ([place]) 12/24/2019  . ICH (intracerebral hemorrhage) ([place]) 11/04/2019  . Intractable back pain 09/13/2019  . Compression fracture of T9 vertebra ([place]) 09/13/2019  . Chronic diastolic CHF (congestive heart failure) ([place]) 09/13/2019  . Overweight (BMI 25.0-29.9) 09/13/2019  . Constipation 09/13/2019  . Raynaud's phenomenon without gangrene 05/19/2018  . Iron deficiency anemia due to chronic blood loss 12/30/2017  . Insomnia 06/25/2017  . Mild CAD 05/24/2017  . PCP NOTES >>> 02/15/2015  . Superficial thrombophlebitis 04/06/2014  . Annual physical exam 08/18/2012  . Edema 04/14/2012  . Breast cancer ([place]) 11/05/2011  . Polycythemia 11/05/2011  . TIA (transient ischemic attack) 01/16/2011  . CARDIOMYOPATHY 08/03/2009  . DJD -- pain mgmt 08/16/2008  . ABDOMINAL PAIN, LEFT LOWER QUADRANT 07/05/2008  . GAIT DISTURBANCE 10/21/2007  . ANXIETY DEPRESSION 10/08/2007  . Hyperlipidemia 06/25/2007  . Takotsubo  cardiomyopathy 06/25/2007  . GERD without esophagitis 06/25/2007  . Essential hypertension 09/30/2006  . Senile osteoporosis 09/30/2006  . Migraine with aura 09/30/2006    Orientation RESPIRATION BLADDER Height & Weight     Self, Time, Situation, Place  Normal Continent, External catheter (purewick currently) Weight: 154 lb 5.2 oz (70 kg) Height:  5\' 5"  (165.1 cm)  BEHAVIORAL SYMPTOMS/MOOD NEUROLOGICAL BOWEL NUTRITION STATUS      Continent    AMBULATORY STATUS COMMUNICATION OF NEEDS Skin   Extensive Assist Verbally Surgical wounds                       Personal Care Assistance Level of Assistance  Bathing, Dressing Bathing Assistance: Limited assistance   Dressing Assistance: Limited assistance     Functional Limitations Info             SPECIAL CARE FACTORS FREQUENCY  PT (By licensed PT), OT (By licensed OT)     PT Frequency: 5x/wk OT Frequency: 5x/wk            Contractures Contractures Info: Not present    Additional Factors Info  Code Status, Allergies Code Status Info: Full Allergies Info: see MAR           Current Medications (12/28/2019):  This is the current hospital active medication list Current Facility-Administered Medications  Medication Dose Route Frequency Provider Last Rate Last Admin  . 0.9 %  sodium chloride infusion   Intravenous Continuous Paralee Cancel, MD   Stopped at 12/26/19 0830  . acetaminophen (TYLENOL) tablet 325-650 mg  325-650 mg Oral Q6H PRN Paralee Cancel, MD   [phone]  mg at 12/26/19 1326  . aspirin EC tablet 81 mg  81 mg Oral BID Paralee Cancel, MD   81 mg at 12/27/19 2151  . bisacodyl (DULCOLAX) suppository 10 mg  10 mg Rectal Daily PRN Charlynne Cousins, MD      . Chlorhexidine Gluconate Cloth 2 % PADS 6 each  6 each Topical Daily Paralee Cancel, MD   6 each at 12/26/19 [phone]  . docusate sodium (COLACE) capsule 100 mg  100 mg Oral BID Paralee Cancel, MD   100 mg at 12/27/19 2151  . feeding supplement (ENSURE ENLIVE)  (ENSURE ENLIVE) liquid 237 mL  237 mL Oral BID BM Charlynne Cousins, MD   237 mL at 12/27/19 1022  . ferrous sulfate tablet 325 mg  325 mg Oral BID WC Paralee Cancel, MD   325 mg at 12/27/19 1600  . HYDROcodone-acetaminophen (NORCO/VICODIN) 5-325 MG per tablet 1-2 tablet  1-2 tablet Oral Q4H PRN Paralee Cancel, MD   2 tablet at 12/28/19 0525  . hydroxyurea (HYDREA) capsule 500 mg  500 mg Oral QODAY Charlynne Cousins, MD   500 mg at 12/27/19 1019  . levETIRAcetam (KEPPRA) tablet 500 mg  500 mg Oral BID Charlynne Cousins, MD   500 mg at 12/27/19 2151  . LORazepam (ATIVAN) tablet 0.25 mg  0.25 mg Oral BID WC Charlynne Cousins, MD   0.25 mg at 12/27/19 1601  . LORazepam (ATIVAN) tablet 0.5 mg  0.5 mg Oral QHS PRN Charlynne Cousins, MD   0.5 mg at 12/27/19 2154  . menthol-cetylpyridinium (CEPACOL) lozenge 3 mg  1 lozenge Oral PRN Paralee Cancel, MD   3 mg at 12/26/19 1324   Or  . phenol (CHLORASEPTIC) mouth spray 1 spray  1 spray Mouth/Throat PRN Paralee Cancel, MD      . methocarbamol (ROBAXIN) tablet 500 mg  500 mg Oral Q6H PRN Paralee Cancel, MD   500 mg at 12/27/19 1601   Or  . methocarbamol (ROBAXIN) 500 mg in dextrose 5 % 50 mL IVPB  500 mg Intravenous Q6H PRN Paralee Cancel, MD   Stopped at 12/25/19 1811  . metoCLOPramide (REGLAN) tablet 5-10 mg  5-10 mg Oral Q8H PRN Paralee Cancel, MD       Or  . metoCLOPramide (REGLAN) injection 5-10 mg  5-10 mg Intravenous Q8H PRN Paralee Cancel, MD      . metoprolol tartrate (LOPRESSOR) tablet 50 mg  50 mg Oral BID Charlynne Cousins, MD   50 mg at 12/27/19 2151  . morphine 2 MG/ML injection 0.5-1 mg  0.5-1 mg Intravenous Q2H PRN Paralee Cancel, MD      . multivitamin with minerals tablet 1 tablet  1 tablet Oral Daily Charlynne Cousins, MD   1 tablet at 12/27/19 1018  . ondansetron (ZOFRAN) tablet 4 mg  4 mg Oral Q6H PRN Paralee Cancel, MD       Or  . ondansetron [facility]) injection 4 mg  4 mg Intravenous Q6H PRN Paralee Cancel, MD   4 mg at  12/26/19 1721  . pantoprazole (PROTONIX) EC tablet 40 mg  40 mg Oral Daily Charlynne Cousins, MD   40 mg at 12/27/19 1020  . polyethylene glycol (MIRALAX / GLYCOLAX) packet 17 g  17 g Oral Daily Charlynne Cousins, MD   17 g at 12/27/19 1018     Discharge Medications: Please see discharge summary for a list of discharge medications.  Relevant Imaging Results:  Relevant Lab Results:  Additional Information SS#: [id number]  Lennart Pall, LCSW

## 2019-12-28 NOTE — Discharge Summary (Signed)
Physician Discharge Summary  [name] WNU:[id number] DOB: 11-Oct-1929 DOA: 12/24/2019  PCP: Colon Branch, MD  Admit date: 12/24/2019 Discharge date: 12/28/2019  Admitted From: home Disposition:  snf Recommendations for Outpatient Follow-up:  1. Follow up with PCP in 1-2 weeks 2. Please obtain BMP/CBC in one week Please follow up with ortho dr Alvan Dame  Home Health:none Equipment/Devices:none Discharge Condition:stable CODE STATUS:full Diet recommendation:cardiac Brief/Interim Summary: Dawn Mcgeehan Buchananis a 86 y.o.femalepast medical history of coronary artery disease Takotsubo cardiomyopathy in 0347, chronic diastolic heart failure CVA and hypertension who was recently discharged from the hospital on 11/12/2019 for an intracranial hemorrhage and acute and cephalopathy due to mechanical fall comes in again for a fall that happened on the day of admission she relates she slipped and fell on her left hip she did not hit her head, she denies any chest pain shortness of breath nausea vomiting lightheadedness double vision or change in vision he did develop sudden left-sided pain related with movement   Discharge Diagnoses:  Active Problems:   ANXIETY DEPRESSION   Essential hypertension   Senile osteoporosis   Chronic diastolic CHF (congestive heart failure) (HCC)   ICH (intracerebral hemorrhage) (HCC)   Comminuted fracture of hip, left, open type I or II, initial encounter ([place])   Closed left hip fracture ([place])   S/p left hip fracture   Malnutrition of moderate degree   Pressure injury of skin  #1 left hip fracture s/p  ORIF 8/13.  Continue pain control.  On aspirin 81 mg twice a day per Ortho for DVT prophylaxis.  Continue stool softeners. Seen by PT recommending SNF.  Follow up with Dr Alvan Dame  #2 history of essential hypertension blood pressure 135/71.  Continue Lopressor 50 mg twice a day.    #3 history of anxiety and depression -on prn ativan at home  #4 history of  chronic diastolic CHF currently stable  #5 history of intracranial hemorrhage stable  #6 history of seizure disorder on Keppra   Pressure Injury 12/24/19 Buttocks Stage 1 -  Intact skin with non-blanchable redness of a localized area usually over a bony prominence. (Active)  12/24/19 1640  Location: Buttocks  Location Orientation:   Staging: Stage 1 -  Intact skin with non-blanchable redness of a localized area usually over a bony prominence.  Wound Description (Comments):   Present on Admission: Yes      Nutrition Problem: Moderate Malnutrition Etiology: acute illness (ICH)    Signs/Symptoms: mild fat depletion, mild muscle depletion     Interventions: Ensure Enlive (each supplement provides 350kcal and 20 grams of protein), MVI  Estimated body mass index is 25.68 kg/m as calculated from the following:   Height as of this encounter: 5\' 5"  (1.651 m).   Weight as of this encounter: 70 kg.  Discharge Instructions  Discharge Instructions    Diet - low sodium heart healthy   Complete by: As directed    Increase activity slowly   Complete by: As directed    Leave dressing on - Keep it clean, dry, and intact until clinic visit   Complete by: As directed      Allergies as of 12/28/2019      Reactions   Penicillins Hives, Rash, Other (See Comments)   Has patient had a PCN reaction causing immediate rash, facial/tongue/throat swelling, SOB or lightheadedness with hypotension: Yes Has patient had a PCN reaction causing severe rash involving mucus membranes or skin necrosis: yes Has patient had a PCN reaction that required  hospitalization: No Has patient had a PCN reaction occurring within the last 10 years: no If all of the above answers are "NO", then may proceed with Cephalosporin use. Other Reaction: OTHER REACTION   Valsartan Itching, Other (See Comments)   Atorvastatin Diarrhea   Carvedilol Other (See Comments)   "teeth hurt when I took it"   Ezetimibe Nausea  Only   Fluvastatin Sodium Other (See Comments)   "Can't remember"   Magnesium Hydroxide Other (See Comments)   REACTION: triggers HAs   Meloxicam Other (See Comments)   Pt seeing auro's / spots.     Pneumovax [pneumococcal Polysaccharide Vaccine] Other (See Comments)   Local reaction   Quinapril Hcl Other (See Comments)    "feelings of tiredness"   Simvastatin Diarrhea   Topamax [topiramate] Itching   Vit D-vit E-safflower Oil Other (See Comments)   Headaches      Medication List    STOP taking these medications   aspirin 81 MG tablet Replaced by: aspirin 81 MG EC tablet   loperamide 2 MG capsule Commonly known as: IMODIUM   valACYclovir 500 MG tablet Commonly known as: VALTREX     TAKE these medications   acetaminophen 500 MG tablet Commonly known as: TYLENOL Take 1,000 mg by mouth every 8 (eight) hours as needed for mild pain or moderate pain. NTE 6 tabs/24 hours   aspirin 81 MG EC tablet Take 1 tablet (81 mg total) by mouth 2 (two) times daily. Swallow whole. Replaces: aspirin 81 MG tablet   bisacodyl 10 MG suppository Commonly known as: DULCOLAX Place 1 suppository (10 mg total) rectally daily as needed for moderate constipation.   bismuth subsalicylate 151 VO/16WV suspension Commonly known as: PEPTO BISMOL Take 30 mLs by mouth every 4 (four) hours as needed for indigestion (heartburn).   Dermacloud Crea Apply 1 application topically See admin instructions. Apply to buttocks topically every shift for rash   docusate sodium 100 MG capsule Commonly known as: COLACE Take 100 mg by mouth 2 (two) times daily.   HYDROcodone-acetaminophen 5-325 MG tablet Commonly known as: NORCO/VICODIN Take 1-2 tablets by mouth every 4 (four) hours as needed for moderate pain (pain score 4-6). What changed:   how much to take  reasons to take this   hydroxyurea 500 MG capsule Commonly known as: HYDREA Take 1 capsule (500 mg total) by mouth every other day. May take with  food to minimize GI side effects.   lactose free nutrition Liqd Take 237 mLs by mouth See admin instructions. Every shift   levETIRAcetam 500 MG tablet Commonly known as: KEPPRA Take 1 tablet (500 mg total) by mouth 2 (two) times daily.   Lidocaine 4 % Ptch LIDOCAINE 4% PATCH :Apply 1 patch topically daily and remove after 12 hours to left lateral chest What changed:   how much to take  how to take this  when to take this  additional instructions   LORazepam 0.5 MG tablet Commonly known as: ATIVAN Take 1 tablet (0.5 mg total) by mouth at bedtime as needed for anxiety. What changed: reasons to take this   LORazepam 0.5 MG tablet Commonly known as: ATIVAN Take 0.5 tablets (0.25 mg total) by mouth 2 (two) times daily with a meal. What changed: when to take this   metoprolol tartrate 50 MG tablet Commonly known as: LOPRESSOR Take 1 tablet (50 mg total) by mouth 2 (two) times daily.   multivitamin with minerals Tabs tablet Take 1 tablet by mouth daily. Start taking  on: December 29, 2019   omeprazole 20 MG capsule Commonly known as: PRILOSEC Take 1 capsule (20 mg total) by mouth daily.   ondansetron 4 MG tablet Commonly known as: ZOFRAN Take 1 tablet (4 mg total) by mouth every 6 (six) hours as needed for nausea or vomiting.   polyethylene glycol 17 g packet Commonly known as: MIRALAX / GLYCOLAX Take 17 g by mouth daily.   tiZANidine 2 MG tablet Commonly known as: ZANAFLEX Take 2 mg by mouth every 6 (six) hours as needed for muscle spasms. What changed: Another medication with the same name was removed. Continue taking this medication, and follow the directions you see here.            Discharge Care Instructions  (From admission, onward)         Start     Ordered   12/28/19 0000  Leave dressing on - Keep it clean, dry, and intact until clinic visit        12/28/19 1341          Contact information for follow-up providers    Colon Branch, MD Follow up.    Specialty: Internal Medicine Contact information: [address] [place] [address] [phone]        Paralee Cancel, MD Follow up.   Specialty: Orthopedic Surgery Contact information: [address] [place] [address] [phone]            Contact information for after-discharge care    Destination    [facility] Preferred SNF .   Service: Skilled Nursing Contact information: [place] [phone]                 Allergies  Allergen Reactions  . Penicillins Hives, Rash and Other (See Comments)    Has patient had a PCN reaction causing immediate rash, facial/tongue/throat swelling, SOB or lightheadedness with hypotension: Yes Has patient had a PCN reaction causing severe rash involving mucus membranes or skin necrosis: yes Has patient had a PCN reaction that required hospitalization: No Has patient had a PCN reaction occurring within the last 10 years: no If all of the above answers are "NO", then may proceed with Cephalosporin use.  Other Reaction: OTHER REACTION  . Valsartan Itching and Other (See Comments)  . Atorvastatin Diarrhea  . Carvedilol Other (See Comments)    "teeth hurt when I took it"   . Ezetimibe Nausea Only  . Fluvastatin Sodium Other (See Comments)    "Can't remember"  . Magnesium Hydroxide Other (See Comments)    REACTION: triggers HAs  . Meloxicam Other (See Comments)    Pt seeing auro's / spots.    . Pneumovax [Pneumococcal Polysaccharide Vaccine] Other (See Comments)    Local reaction  . Quinapril Hcl Other (See Comments)     "feelings of tiredness"  . Simvastatin Diarrhea  . Topamax [Topiramate] Itching  . Vit D-Vit E-Safflower Oil Other (See Comments)    Headaches    Consultations: ortho  Procedures/Studies: CT Head Wo Contrast  Result Date: 12/24/2019 CLINICAL DATA:  Head trauma, minor. Additional history provided:  Unwitnessed fall. EXAM: CT HEAD WITHOUT CONTRAST TECHNIQUE: Contiguous axial images were obtained from the base of the skull through the vertex without intravenous contrast. COMPARISON:  Prior head CT 11/19/2019. FINDINGS: Brain: Stable, mild generalized parenchymal atrophy. Redemonstrated encephalomalacia within the right temporal lobe and anteroinferior right frontal lobe at site of prior contusions.  Redemonstrated chronic infarcts within the right thalamus and left cerebellum. Stable background moderate/advanced patchy and confluent hypoattenuation within the cerebral white matter which is nonspecific, but consistent with chronic small vessel ischemic disease. There is no acute intracranial hemorrhage. No acute demarcated cortical infarct is identified. No extra-axial fluid collection. No evidence of intracranial mass. No midline shift. Vascular: No hyperdense vessel.  Atherosclerotic calcifications. Skull: Redemonstrated subacute nondepressed left parietooccipital calvarial fracture. No interval calvarial fracture is identified. Sinuses/Orbits: Visualized orbits show no acute finding. Minimal ethmoid sinus mucosal thickening. No significant mastoid effusion. IMPRESSION: No CT evidence of acute intracranial abnormality. Encephalomalacia at sites of prior right temporal and frontal lobe parenchymal contusions. Redemonstrated subacute nondepressed left parietooccipital calvarial fracture. Stable generalized parenchymal atrophy and chronic ischemic changes as described. Minimal ethmoid sinus mucosal thickening. Electronically Signed   By: Kellie Simmering DO   On: 12/24/2019 09:00   DG C-Arm 1-60 Min-No Report  Result Date: 12/25/2019 Fluoroscopy was utilized by the requesting physician.  No radiographic interpretation.   DG HIP OPERATIVE UNILAT WITH PELVIS LEFT  Result Date: 12/25/2019 CLINICAL DATA:  IM nail EXAM: OPERATIVE LEFT HIP (WITH PELVIS IF PERFORMED) 2 VIEWS TECHNIQUE: Fluoroscopic spot image(s) were  submitted for interpretation post-operatively. COMPARISON:  12/24/2019 FINDINGS: Placement of IM nail and dynamic hip screw across the proximal left femoral fracture. No hardware complicating feature. Anatomic alignment. IMPRESSION: Internal fixation.  No visible complicating feature. Electronically Signed   By: Rolm Baptise M.D.   On: 12/25/2019 17:42   DG Hip Unilat W or Wo Pelvis 2-3 Views Left  Result Date: 12/24/2019 CLINICAL DATA:  Status post fall.  Left hip pain and deformity. EXAM: DG HIP (WITH OR WITHOUT PELVIS) 2-3V LEFT COMPARISON:  11/04/2019. FINDINGS: There is an acute, comminuted, impacted intertrochanteric fracture involving the proximal left femur. Moderate medial angulation of the distal fracture fragments. No dislocation identified. IMPRESSION: Acute, comminuted, impacted intertrochanteric fracture of the proximal left femur. Electronically Signed   By: Kerby Moors M.D.   On: 12/24/2019 09:08    (Echo, Carotid, EGD, Colonoscopy, ERCP)    Subjective: Resting tired wants to sleep no other c/o  Discharge Exam: Vitals:   12/28/19 0913 12/28/19 0913  BP: 117/60 117/60  Pulse: 78 75  Resp:    Temp:    SpO2: 93% 93%   Vitals:   12/27/19 2150 12/28/19 0536 12/28/19 0913 12/28/19 0913  BP: 118/78 (!) 158/85 117/60 117/60  Pulse: 81 79 78 75  Resp: 14 16    Temp: (!) 97.4 F (36.3 C) 98 F (36.7 C)    TempSrc: Oral Oral    SpO2: 95% 90% 93% 93%  Weight:      Height:        General: Pt is alert, awake, not in acute distress Cardiovascular: RRR, S1/S2 +, no rubs, no gallops Respiratory: CTA bilaterally, no wheezing, no rhonchi Abdominal: Soft, NT, ND, bowel sounds + Extremities: Left hip dressing in place   The results of significant diagnostics from this hospitalization (including imaging, microbiology, ancillary and laboratory) are listed below for reference.     Microbiology: Recent Results (from the past 240 hour(s))  SARS Coronavirus 2 by RT PCR  (hospital order, performed in [facility] hospital lab) Nasopharyngeal Nasopharyngeal Swab     Status: None   Collection Time: 12/24/19  8:14 AM   Specimen: Nasopharyngeal Swab  Result Value Ref Range Status   SARS Coronavirus 2 NEGATIVE NEGATIVE Final    Comment: (NOTE) SARS-CoV-2 target nucleic acids  are NOT DETECTED.  The SARS-CoV-2 RNA is generally detectable in upper and lower respiratory specimens during the acute phase of infection. The lowest concentration of SARS-CoV-2 viral copies this assay can detect is 250 copies / mL. A negative result does not preclude SARS-CoV-2 infection and should not be used as the sole basis for treatment or other patient management decisions.  A negative result may occur with improper specimen collection / handling, submission of specimen other than nasopharyngeal swab, presence of viral mutation(s) within the areas targeted by this assay, and inadequate number of viral copies (<250 copies / mL). A negative result must be combined with clinical observations, patient history, and epidemiological information.  Fact Sheet for Patients:   StrictlyIdeas.no  Fact Sheet for Healthcare Providers: BankingDealers.co.za  This test is not yet approved or  cleared by the [place] FDA and has been authorized for detection and/or diagnosis of SARS-CoV-2 by FDA under an Emergency Use Authorization (EUA).  This EUA will remain in effect (meaning this test can be used) for the duration of the COVID-19 declaration under Section 564(b)(1) of the Act, 21 U.S.C. section 360bbb-3(b)(1), unless the authorization is terminated or revoked sooner.  Performed at [facility], [place] [address], [place], [place] [address]   Surgical pcr screen     Status: None   Collection Time: 12/24/19  6:22 PM   Specimen: Nasal Mucosa; Nasal Swab  Result Value Ref Range Status   MRSA, PCR NEGATIVE NEGATIVE Final    Staphylococcus aureus NEGATIVE NEGATIVE Final    Comment: (NOTE) The Xpert SA Assay (FDA approved for NASAL specimens in patients 78 years of age and older), is one component of a comprehensive surveillance program. It is not intended to diagnose infection nor to guide or monitor treatment. Performed at [facility], [place] [address], [place], [place] [address]   SARS Coronavirus 2 by RT PCR (hospital order, performed in [facility] hospital lab) Nasopharyngeal Nasopharyngeal Swab     Status: None   Collection Time: 12/28/19 11:32 AM   Specimen: Nasopharyngeal Swab  Result Value Ref Range Status   SARS Coronavirus 2 NEGATIVE NEGATIVE Final    Comment: (NOTE) SARS-CoV-2 target nucleic acids are NOT DETECTED.  The SARS-CoV-2 RNA is generally detectable in upper and lower respiratory specimens during the acute phase of infection. The lowest concentration of SARS-CoV-2 viral copies this assay can detect is 250 copies / mL. A negative result does not preclude SARS-CoV-2 infection and should not be used as the sole basis for treatment or other patient management decisions.  A negative result may occur with improper specimen collection / handling, submission of specimen other than nasopharyngeal swab, presence of viral mutation(s) within the areas targeted by this assay, and inadequate number of viral copies (<250 copies / mL). A negative result must be combined with clinical observations, patient history, and epidemiological information.  Fact Sheet for Patients:   StrictlyIdeas.no  Fact Sheet for Healthcare Providers: BankingDealers.co.za  This test is not yet approved or  cleared by the [place] FDA and has been authorized for detection and/or diagnosis of SARS-CoV-2 by FDA under an Emergency Use Authorization (EUA).  This EUA will remain in effect (meaning this test can be used) for the duration of the COVID-19  declaration under Section 564(b)(1) of the Act, 21 U.S.C. section 360bbb-3(b)(1), unless the authorization is terminated or revoked sooner.  Performed at [facility], [place] [address], [place], [place] [address]  Labs: BNP (last 3 results) No results for input(s): BNP in the last 8760 hours. Basic Metabolic Panel: Recent Labs  Lab 12/24/19 0813 12/25/19 0313 12/26/19 0252 12/27/19 0307  NA 136 136 127* 133*  K 3.9 3.9 4.1 3.2*  CL 99 103 96* 101  CO2 28 28 25 27   GLUCOSE 162* 126* 157* 105*  BUN 9 12 9 8   CREATININE 0.42* 0.37* <0.30* <0.30*  CALCIUM 8.9 8.3* 7.9* 8.2*   Liver Function Tests: Recent Labs  Lab 12/24/19 0813 12/26/19 0252  AST 22 22  ALT 16 12  ALKPHOS 74 56  BILITOT 1.8* 1.7*  PROT 5.8* 5.0*  ALBUMIN 3.3* 2.5*   No results for input(s): LIPASE, AMYLASE in the last 168 hours. No results for input(s): AMMONIA in the last 168 hours. CBC: Recent Labs  Lab 12/24/19 0813 12/25/19 0313 12/26/19 0252 12/27/19 0307 12/28/19 0325  WBC 8.8 6.0 6.6 5.8 4.8  NEUTROABS 7.8*  --   --   --   --   HGB 14.2 11.9* 11.9* 10.3* 10.2*  HCT 44.3 38.0 36.5 31.2* 31.2*  MCV 101.4* 104.1* 102.0* 101.6* 103.3*  PLT 279 240 231 224 236   Cardiac Enzymes: No results for input(s): CKTOTAL, CKMB, CKMBINDEX, TROPONINI in the last 168 hours. BNP: Invalid input(s): POCBNP CBG: No results for input(s): GLUCAP in the last 168 hours. D-Dimer No results for input(s): DDIMER in the last 72 hours. Hgb A1c No results for input(s): HGBA1C in the last 72 hours. Lipid Profile No results for input(s): CHOL, HDL, LDLCALC, TRIG, CHOLHDL, LDLDIRECT in the last 72 hours. Thyroid function studies No results for input(s): TSH, T4TOTAL, T3FREE, THYROIDAB in the last 72 hours.  Invalid input(s): FREET3 Anemia work up No results for input(s): VITAMINB12, FOLATE, FERRITIN, TIBC, IRON, RETICCTPCT in the last 72 hours. Urinalysis    Component Value Date/Time    COLORURINE YELLOW 11/06/2019 1638   APPEARANCEUR CLEAR 11/06/2019 1638   LABSPEC 1.017 11/06/2019 1638   LABSPEC 1.010 05/27/2013 1433   PHURINE 6.0 11/06/2019 1638   GLUCOSEU 50 (A) 11/06/2019 1638   GLUCOSEU NEGATIVE 09/02/2017 1347   HGBUR NEGATIVE 11/06/2019 1638   HGBUR negative 10/06/2009 1015   BILIRUBINUR NEGATIVE 11/06/2019 1638   BILIRUBINUR Negative 01/21/2018 1618   KETONESUR 5 (A) 11/06/2019 1638   PROTEINUR 30 (A) 11/06/2019 1638   UROBILINOGEN negative (A) 01/21/2018 1618   UROBILINOGEN 0.2 09/02/2017 1347   UROBILINOGEN 0.2 05/27/2013 1433   NITRITE NEGATIVE 11/06/2019 1638   LEUKOCYTESUR NEGATIVE 11/06/2019 1638   Sepsis Labs Invalid input(s): PROCALCITONIN,  WBC,  LACTICIDVEN Microbiology Recent Results (from the past 240 hour(s))  SARS Coronavirus 2 by RT PCR (hospital order, performed in [place] hospital lab) Nasopharyngeal Nasopharyngeal Swab     Status: None   Collection Time: 12/24/19  8:14 AM   Specimen: Nasopharyngeal Swab  Result Value Ref Range Status   SARS Coronavirus 2 NEGATIVE NEGATIVE Final    Comment: (NOTE) SARS-CoV-2 target nucleic acids are NOT DETECTED.  The SARS-CoV-2 RNA is generally detectable in upper and lower respiratory specimens during the acute phase of infection. The lowest concentration of SARS-CoV-2 viral copies this assay can detect is 250 copies / mL. A negative result does not preclude SARS-CoV-2 infection and should not be used as the sole basis for treatment or other patient management decisions.  A negative result may occur with improper specimen collection / handling, submission of specimen other than nasopharyngeal swab, presence of viral mutation(s) within the areas  targeted by this assay, and inadequate number of viral copies (<250 copies / mL). A negative result must be combined with clinical observations, patient history, and epidemiological information.  Fact Sheet for Patients:    StrictlyIdeas.no  Fact Sheet for Healthcare Providers: BankingDealers.co.za  This test is not yet approved or  cleared by the [place] FDA and has been authorized for detection and/or diagnosis of SARS-CoV-2 by FDA under an Emergency Use Authorization (EUA).  This EUA will remain in effect (meaning this test can be used) for the duration of the COVID-19 declaration under Section 564(b)(1) of the Act, 21 U.S.C. section 360bbb-3(b)(1), unless the authorization is terminated or revoked sooner.  Performed at [facility], [place] [address] [place], [place] [address]   Surgical pcr screen     Status: None   Collection Time: 12/24/19  6:22 PM   Specimen: Nasal Mucosa; Nasal Swab  Result Value Ref Range Status   MRSA, PCR NEGATIVE NEGATIVE Final   Staphylococcus aureus NEGATIVE NEGATIVE Final    Comment: (NOTE) The Xpert SA Assay (FDA approved for NASAL specimens in patients 66 years of age and older), is one component of a comprehensive surveillance program. It is not intended to diagnose infection nor to guide or monitor treatment. Performed at [facility], [place] [address] [place], [place] [address]   SARS Coronavirus 2 by RT PCR (hospital order, performed in [facility] hospital lab) Nasopharyngeal Nasopharyngeal Swab     Status: None   Collection Time: 12/28/19 11:32 AM   Specimen: Nasopharyngeal Swab  Result Value Ref Range Status   SARS Coronavirus 2 NEGATIVE NEGATIVE Final    Comment: (NOTE) SARS-CoV-2 target nucleic acids are NOT DETECTED.  The SARS-CoV-2 RNA is generally detectable in upper and lower respiratory specimens during the acute phase of infection. The lowest concentration of SARS-CoV-2 viral copies this assay can detect is 250 copies / mL. A negative result does not preclude SARS-CoV-2 infection and should not be used as the sole basis for treatment or other patient  management decisions.  A negative result may occur with improper specimen collection / handling, submission of specimen other than nasopharyngeal swab, presence of viral mutation(s) within the areas targeted by this assay, and inadequate number of viral copies (<250 copies / mL). A negative result must be combined with clinical observations, patient history, and epidemiological information.  Fact Sheet for Patients:   StrictlyIdeas.no  Fact Sheet for Healthcare Providers: BankingDealers.co.za  This test is not yet approved or  cleared by the [place] FDA and has been authorized for detection and/or diagnosis of SARS-CoV-2 by FDA under an Emergency Use Authorization (EUA).  This EUA will remain in effect (meaning this test can be used) for the duration of the COVID-19 declaration under Section 564(b)(1) of the Act, 21 U.S.C. section 360bbb-3(b)(1), unless the authorization is terminated or revoked sooner.  Performed at [facility], [place] [address], [place], [place] [address]      Time coordinating discharge: 39 minutes  SIGNED:   Georgette Shell, MD  [facility] 12/28/2019, 1:42 PM Pager   If 7PM-7AM, please contact night-coverage [email or web address] Password [email or web address]

## 2019-12-29 ENCOUNTER — Other Ambulatory Visit: Payer: Self-pay | Admitting: *Deleted

## 2019-12-29 NOTE — Patient Outreach (Signed)
Member screened for potential [facility] Care Management needs as a benefit of [place] Medicare.  Verified in Patient [name] that [name] has readmitted to [facility]. She is from [facility] prior.   Communication sent to facility SW to make aware writer is following for potential [facility] Care Management needs and transition plans.  Will continue to follow while member resides in SNF.    [name], MSN-Ed, RN,BSN [place] Acute Care Coordinator [phone] [facility]) [phone]  (Toll free office)

## 2019-12-31 ENCOUNTER — Other Ambulatory Visit: Payer: Self-pay | Admitting: *Deleted

## 2019-12-31 DIAGNOSIS — G9341 Metabolic encephalopathy: Secondary | ICD-10-CM | POA: Diagnosis not present

## 2019-12-31 DIAGNOSIS — S72009A Fracture of unspecified part of neck of unspecified femur, initial encounter for closed fracture: Secondary | ICD-10-CM | POA: Diagnosis not present

## 2019-12-31 DIAGNOSIS — Z8679 Personal history of other diseases of the circulatory system: Secondary | ICD-10-CM | POA: Diagnosis not present

## 2019-12-31 DIAGNOSIS — G40909 Epilepsy, unspecified, not intractable, without status epilepticus: Secondary | ICD-10-CM | POA: Diagnosis not present

## 2019-12-31 NOTE — Patient Outreach (Signed)
THN Post- Acute Care Coordinator follow up. Member screened for potential [facility] Care Management needs as a benefit of [place] Medicare.  Update received from [facility] SW Deatra Canter) indicating member will return to [facility] upon SNF dc.  No identifiable [facility] Care Management needs at this time.  [name], MSN-Ed, RN,BSN [place] Acute Care Coordinator [phone] [facility]) [phone]  (Toll free office)

## 2020-01-05 ENCOUNTER — Other Ambulatory Visit: Payer: Self-pay | Admitting: *Deleted

## 2020-01-05 NOTE — Patient Outreach (Signed)
THN Post- Acute Care Coordinator follow up. Member screened for potential [facility] Care Management needs as a benefit of [place] Medicare.  Per Patient [name] member resides in [facility].   Communication sent to [facility] SW to request update on transtion plans.   Will continue to follow while member resides in SNF.   [name], MSN-Ed, RN,BSN [place] Acute Care Coordinator [phone] [facility]) [phone]  (Toll free office)

## 2020-01-07 DIAGNOSIS — S72142A Displaced intertrochanteric fracture of left femur, initial encounter for closed fracture: Secondary | ICD-10-CM | POA: Diagnosis not present

## 2020-01-07 DIAGNOSIS — Z4789 Encounter for other orthopedic aftercare: Secondary | ICD-10-CM | POA: Diagnosis not present

## 2020-01-11 ENCOUNTER — Ambulatory Visit: Payer: Medicare Other | Admitting: Internal Medicine

## 2020-01-12 ENCOUNTER — Other Ambulatory Visit: Payer: Self-pay | Admitting: *Deleted

## 2020-01-12 NOTE — Patient Outreach (Signed)
THN  Post- Acute Care Coordinator follow up. Member screened for potential [facility] Care Management needs as a benefit of [place] Medicare.  [facility] SW previously reported dc plan is for [facility]. Communication sent to [facility] to inquire about transition plan.  Will continue to follow.  [name], MSN-Ed, RN,BSN [place] Acute Care Coordinator [phone] [facility]) [phone]  (Toll free office)

## 2020-01-15 IMAGING — CR DG CHEST 2V
2 series · 2 of 2 positions shown · non-contrast
Comparison: 02/07/2017.

CLINICAL DATA: Chest pain radiating to the abdomen.

EXAM:
CHEST  2 VIEW

[w chest pa]
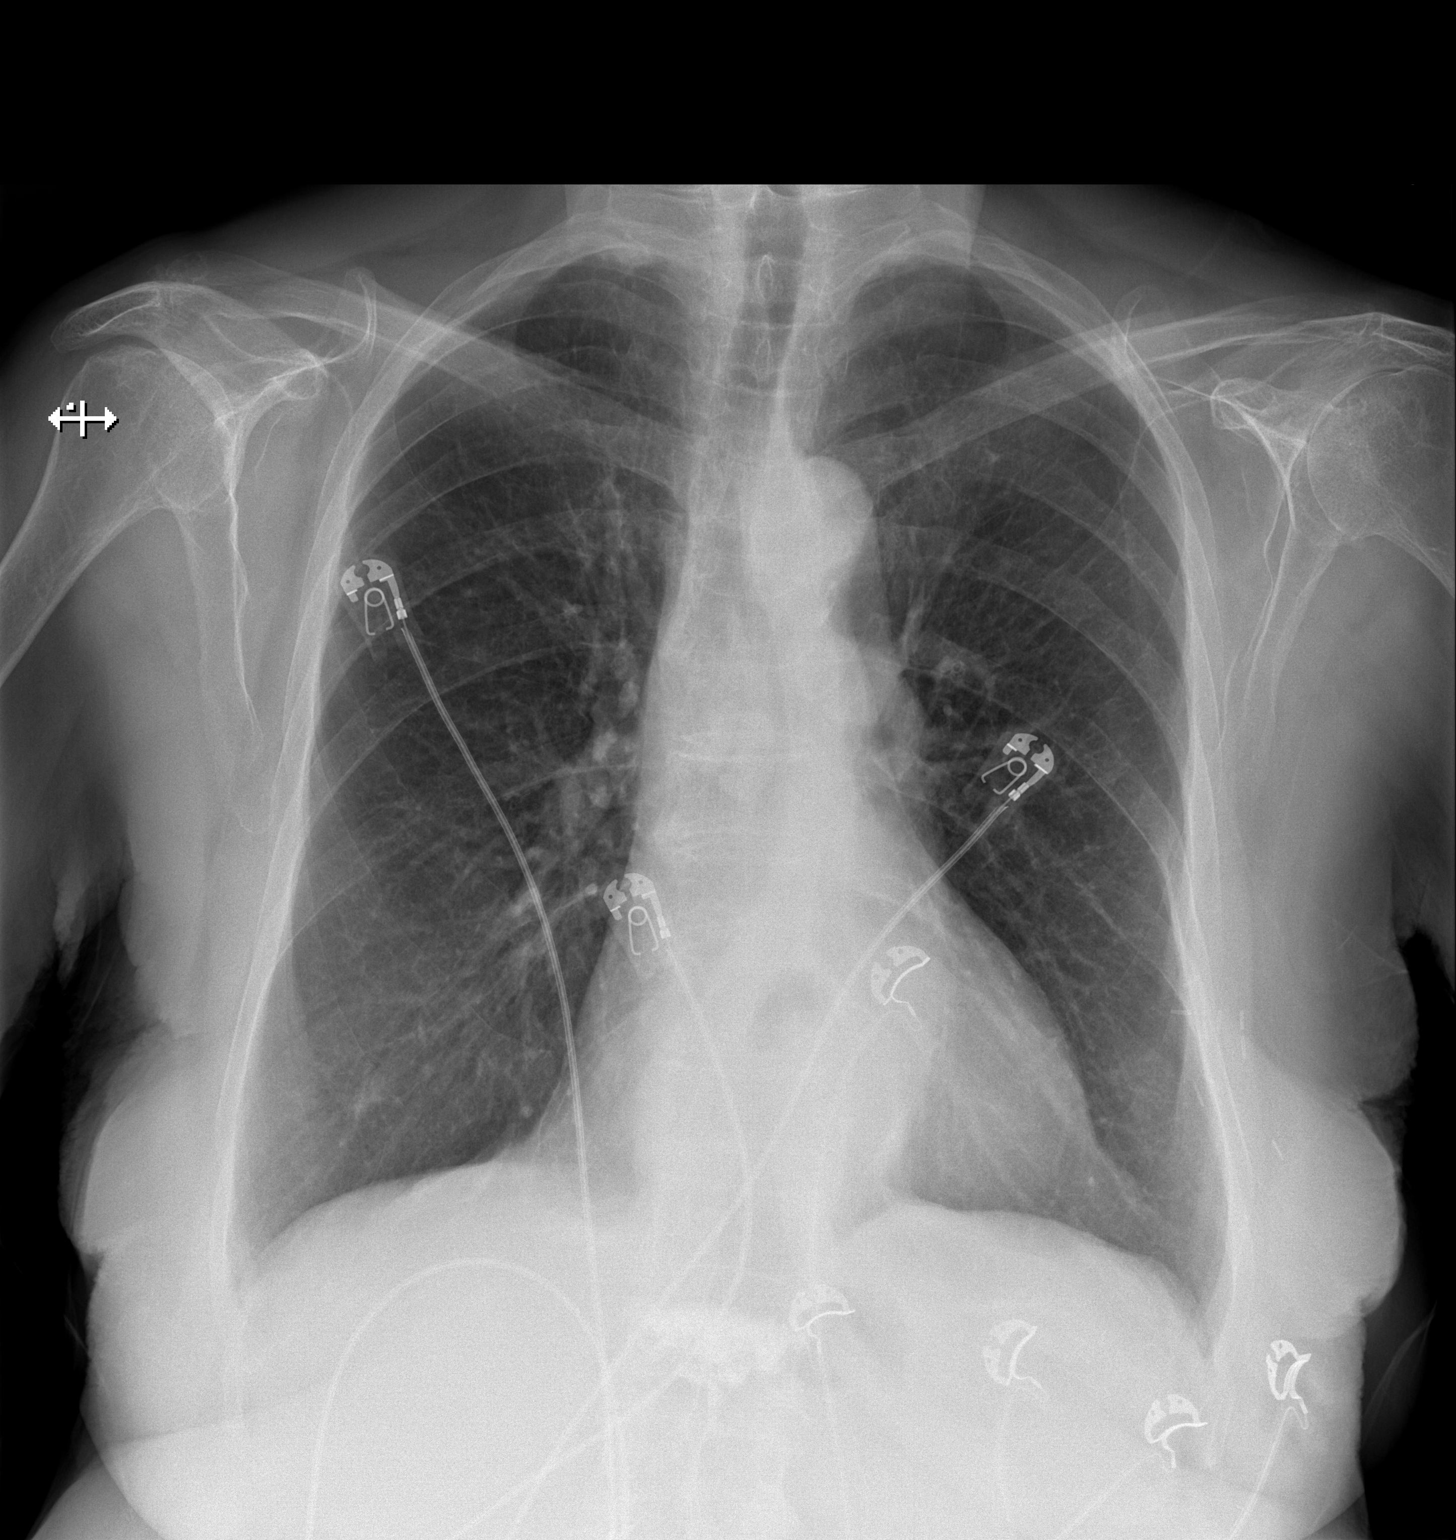

[w chest lat]
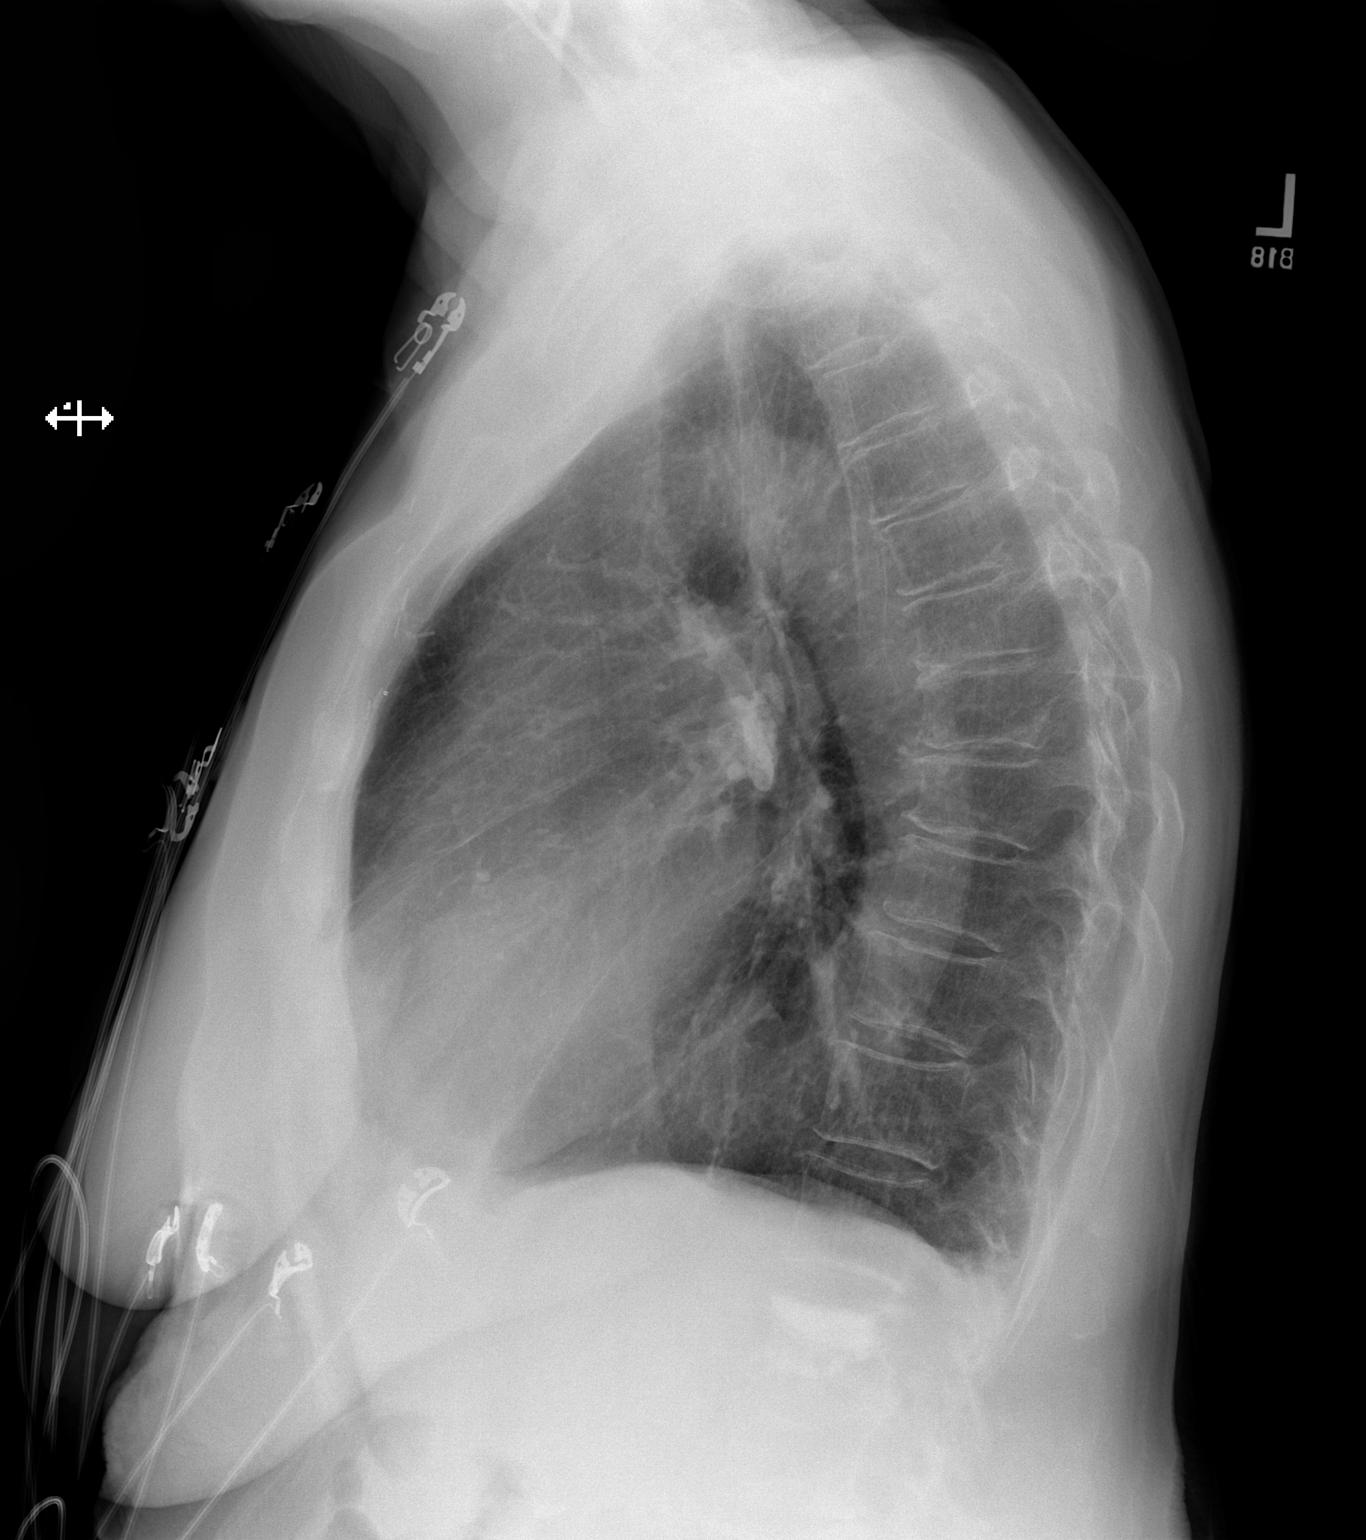

[2 of 2 positions shown; findings below may reference images not displayed]

FINDINGS: Trachea is midline. Heart size stable. Calcified subcarinal lymph
node. Lungs are hyperinflated with minimal biapical pleural
thickening. No airspace consolidation or pleural fluid. Moderate
hiatal hernia. Upper lumbar vertebral body augmentation.
IMPRESSION: Hyperinflation without acute finding.

## 2020-01-15 IMAGING — CT CT ABD-PELV W/O CM
2 of 4 series · 17 of 46 positions shown, 19 images · non-contrast
Comparison: CT scan of October 19, 2015.

CLINICAL DATA: Acute left lower quadrant abdominal pain.

EXAM:
CT ABDOMEN AND PELVIS WITHOUT CONTRAST
TECHNIQUE: Multidetector CT imaging of the abdomen and pelvis was performed
following the standard protocol without IV contrast.

[Series 2: axial st · axial · 0.78mm/px · z∈[-424,-59]mm · 14 of 83 slices shown, 16 images]
[im 5/83  soft-tissue]
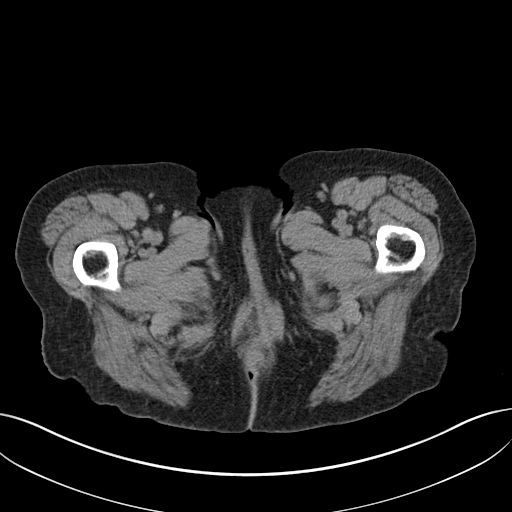
[im 5/83  bone]
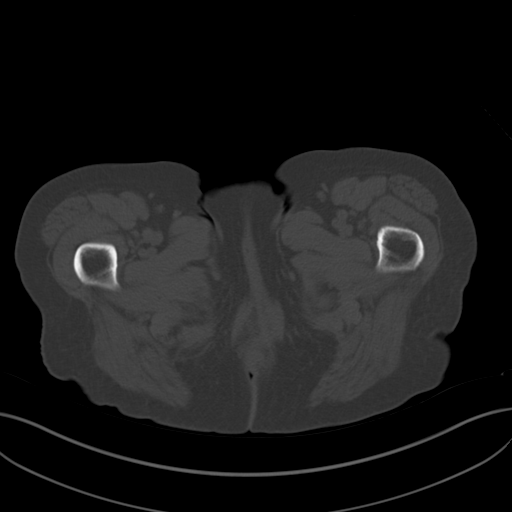
[im 10/83  soft-tissue]
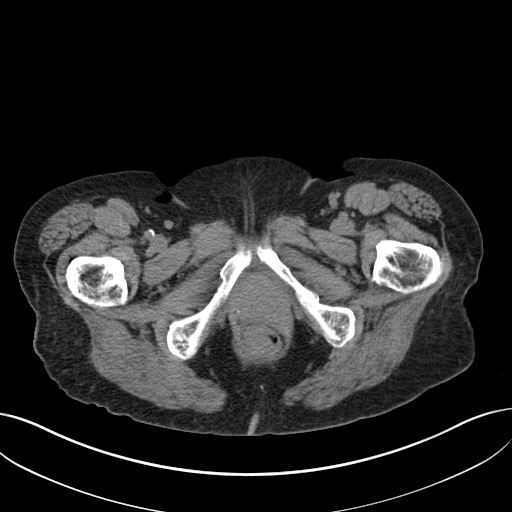
[im 19/83  soft-tissue]
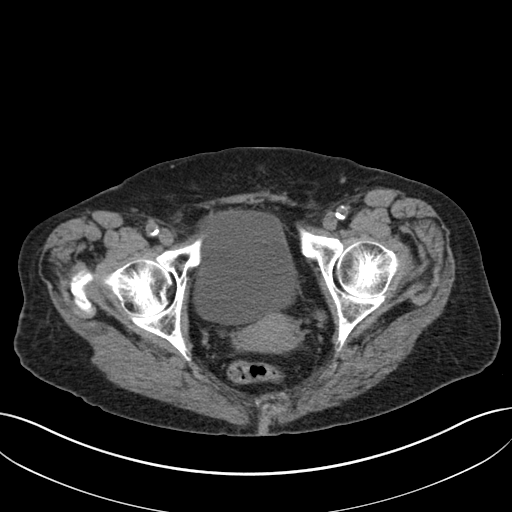
[im 23/83  soft-tissue]
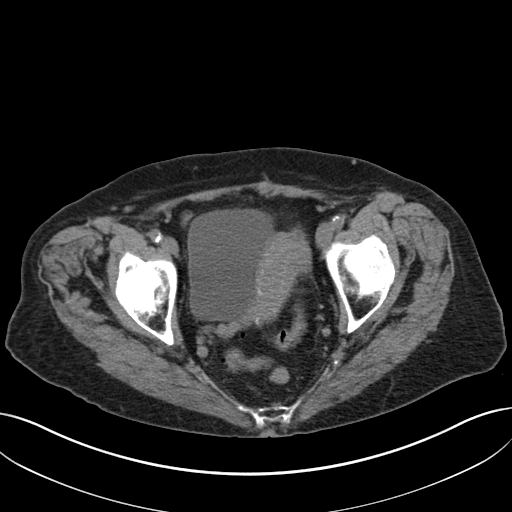
[im 28/83  soft-tissue]
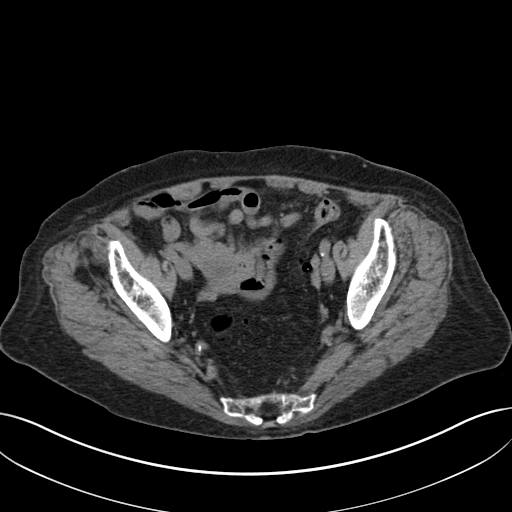
[im 32/83  soft-tissue]
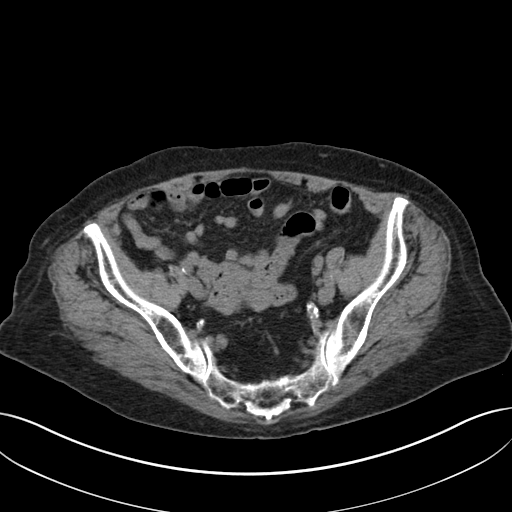
[im 37/83  soft-tissue]
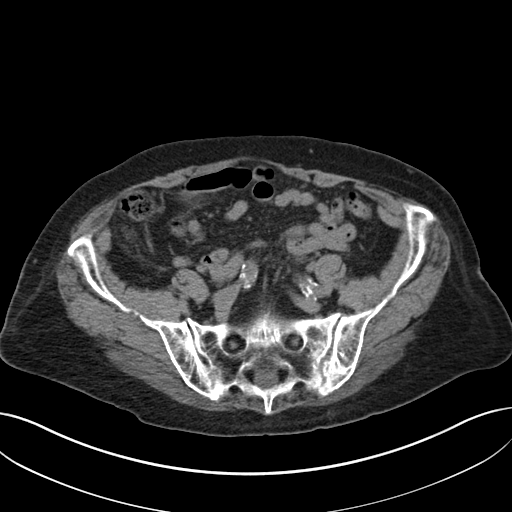
[im 46/83  soft-tissue]
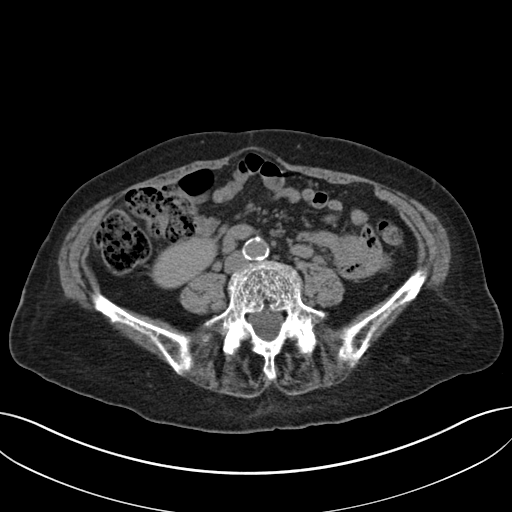
[im 51/83  soft-tissue]
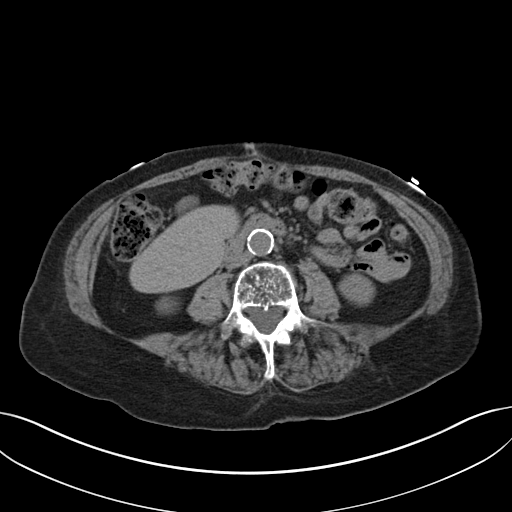
[im 51/83  bone]
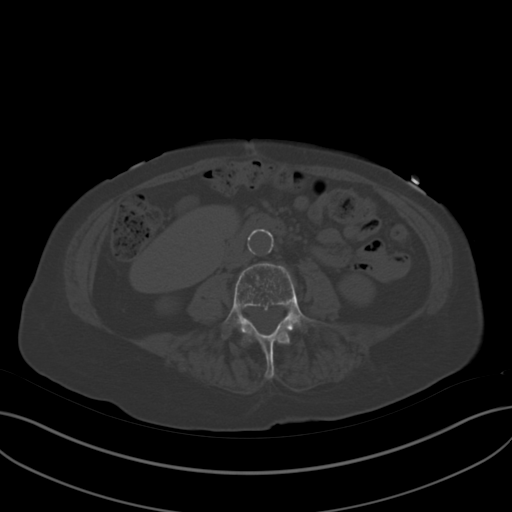
[im 55/83  soft-tissue]
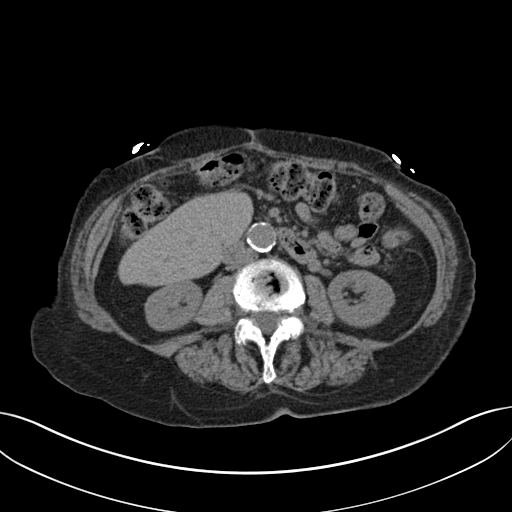
[im 60/83  soft-tissue]
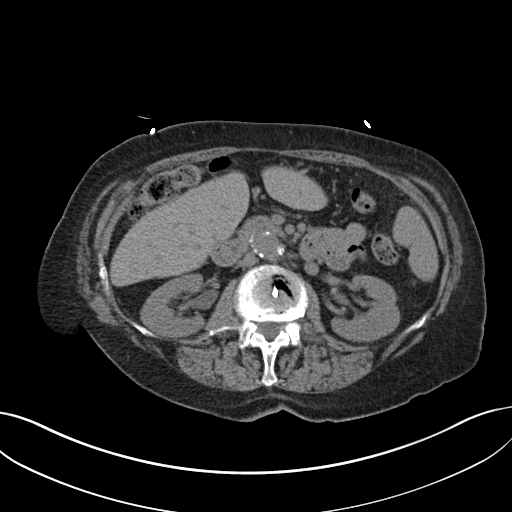
[im 64/83  soft-tissue]
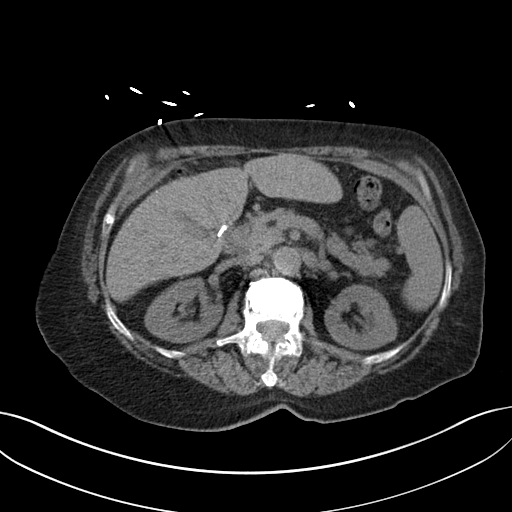
[im 73/83  soft-tissue]
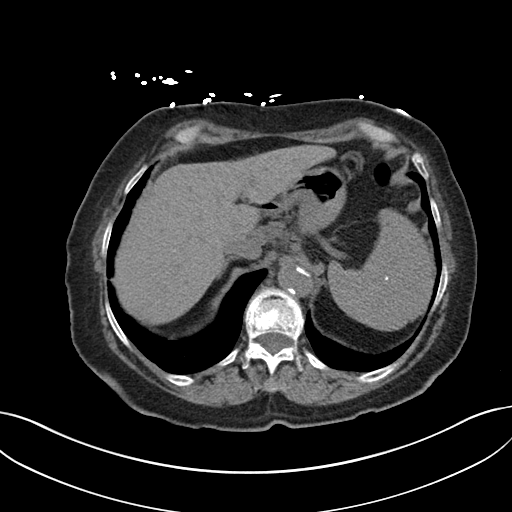
[im 78/83  soft-tissue]
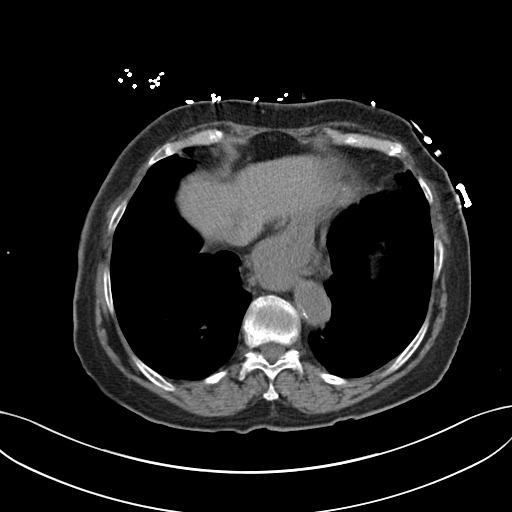

[Series 5: coronal st · coronal · 0.78mm/px · 3 of 93 slices shown]
[im 31/93  soft-tissue]
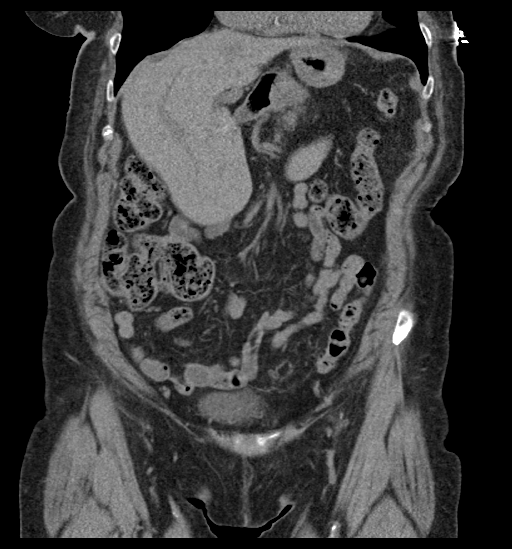
[im 41/93  soft-tissue]
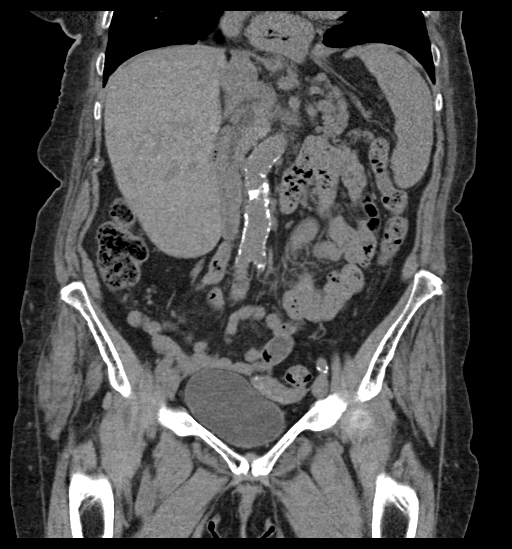
[im 52/93  soft-tissue]
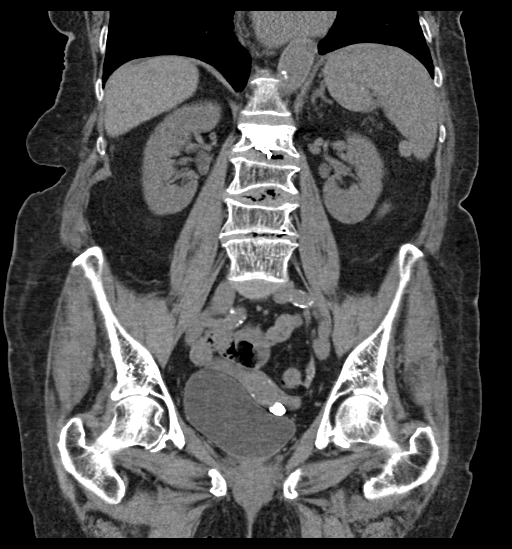

[17 of 46 positions shown; findings below may reference images not displayed]

FINDINGS: Lower chest: Large sliding-type hiatal hernia is noted. Visualized
lung bases are unremarkable.

Hepatobiliary: No focal liver abnormality is seen. Status post
cholecystectomy. No biliary dilatation.

Pancreas: Unremarkable. No pancreatic ductal dilatation or
surrounding inflammatory changes.

Spleen: Stable calcified granulomata are noted.

Adrenals/Urinary Tract: Adrenal glands are unremarkable. Kidneys are
normal, without renal calculi, focal lesion, or hydronephrosis.
Bladder is unremarkable.

Stomach/Bowel: There is no evidence of bowel obstruction or
inflammation. Status post appendectomy.

Vascular/Lymphatic: Aortic atherosclerosis. No enlarged abdominal or
pelvic lymph nodes.

Reproductive: Calcified uterine fibroid is noted. No adnexal
abnormality is noted.

Other: No abdominal wall hernia or abnormality. No abdominopelvic
ascites.

Musculoskeletal: Status post L2 kyphoplasty. No acute abnormality is
noted.
IMPRESSION: Large sliding-type hiatal hernia.

No hydronephrosis or renal obstruction is noted. No renal or
ureteral calculi are noted.

Calcified uterine fibroid is noted.

Sigmoid diverticulosis is noted without inflammation.

Aortic Atherosclerosis ([id number]-DD5.5).

## 2020-01-15 IMAGING — CT CT HEAD W/O CM
3 series · 15 of 47 positions shown, 18 images · non-contrast
Comparison: CT scan of October 19, 2011.

CLINICAL DATA: Facial weakness.

EXAM:
CT HEAD WITHOUT CONTRAST
TECHNIQUE: Contiguous axial images were obtained from the base of the skull
through the vertex without intravenous contrast.

[Series 2: head wo · axial · 0.42mm/px · z∈[+1664,+1790]mm · 9 of 31 slices shown, 12 images]
[im 3/31  brain]
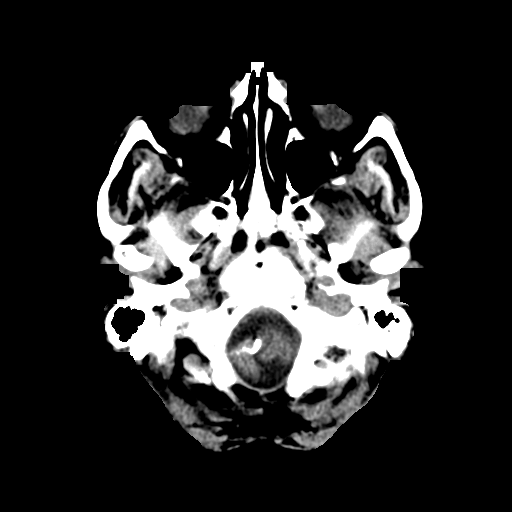
[im 3/31  bone]
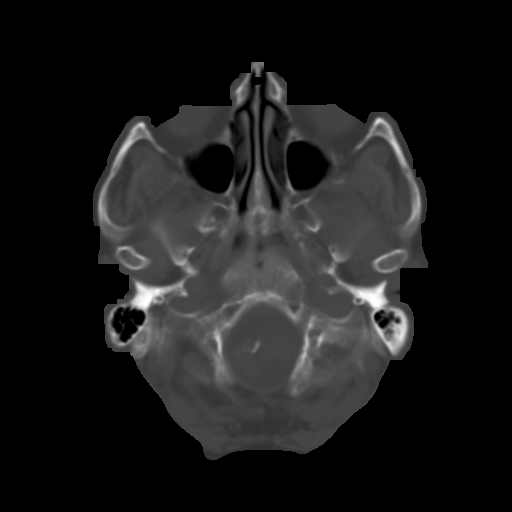
[im 6/31  brain]
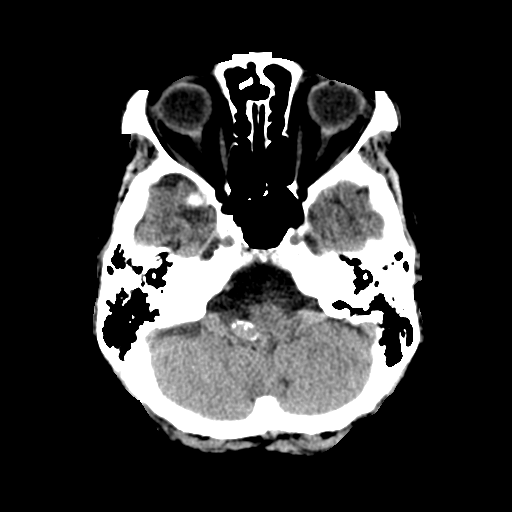
[im 9/31  brain]
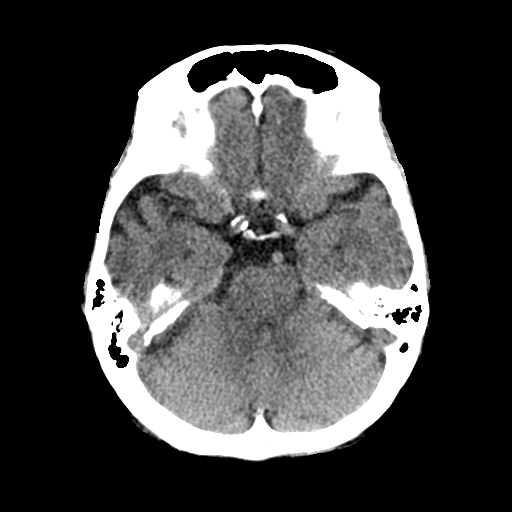
[im 12/31  brain]
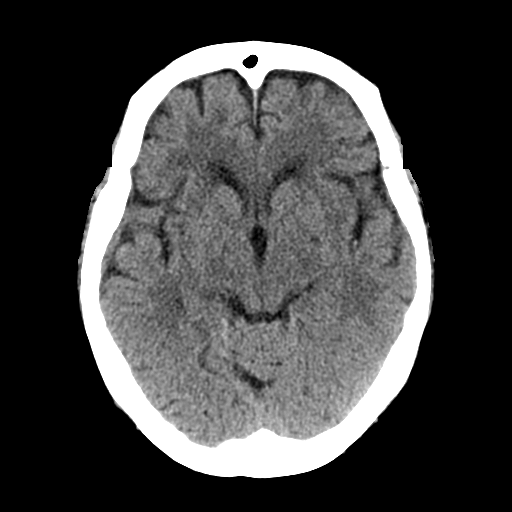
[im 16/31  brain]
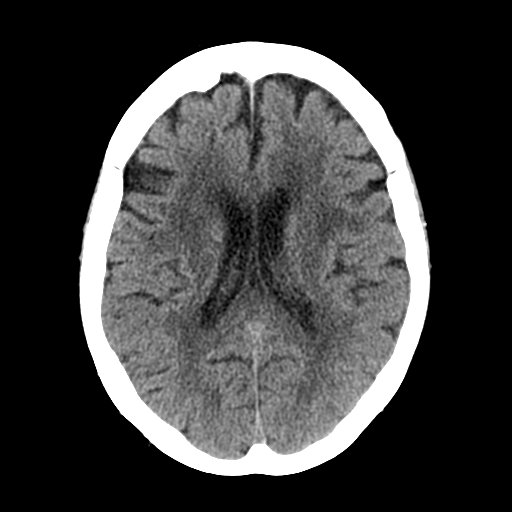
[im 16/31  bone]
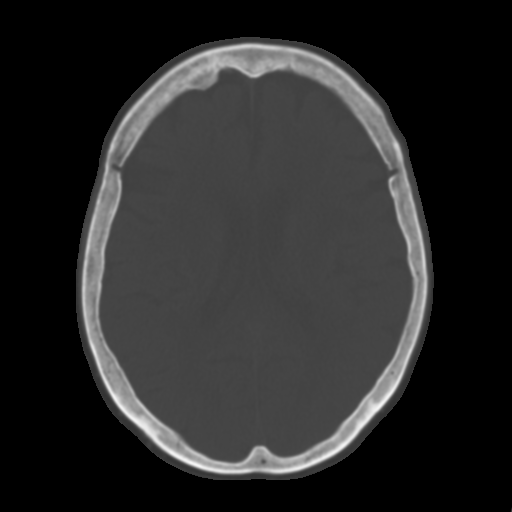
[im 19/31  brain]
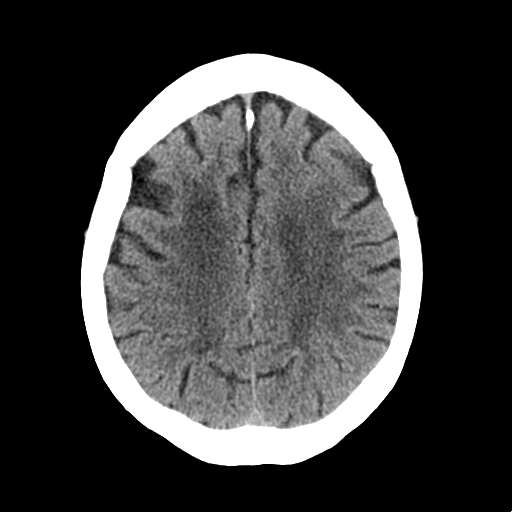
[im 22/31  brain]
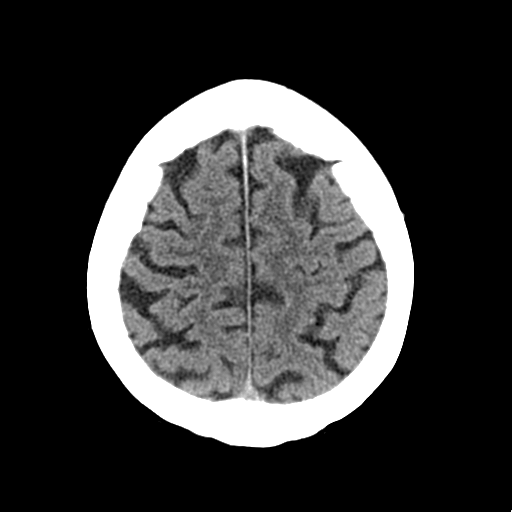
[im 25/31  brain]
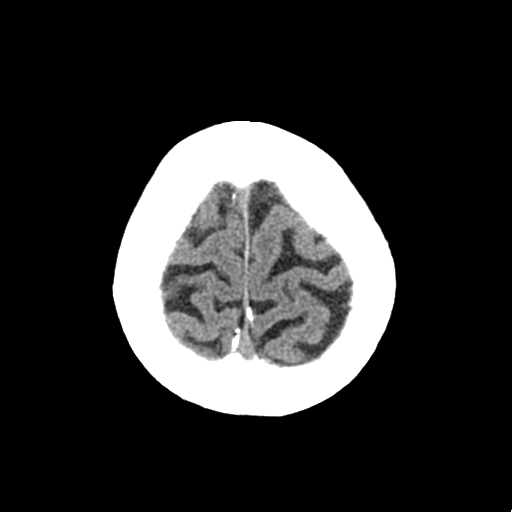
[im 28/31  brain]
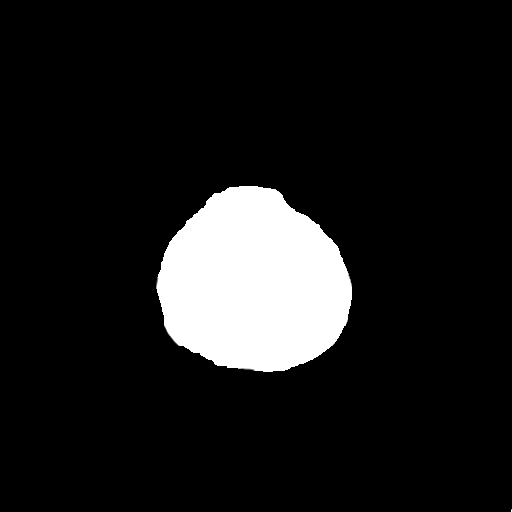
[im 28/31  bone]
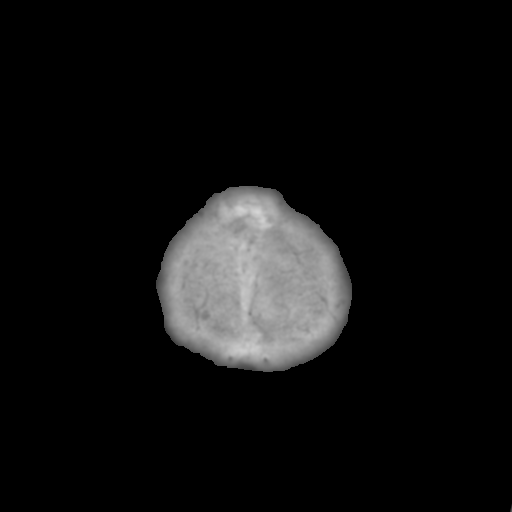

[Series 5: coronal soft tissue · coronal · 0.32mm/px · 3 of 71 slices shown]
[im 24/71  brain]
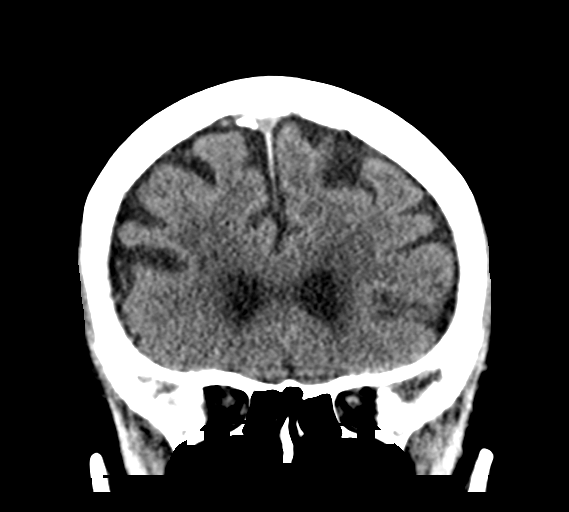
[im 32/71  brain]
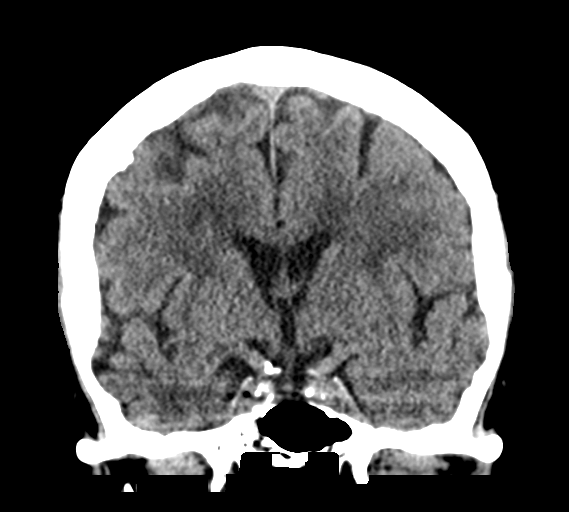
[im 39/71  brain]
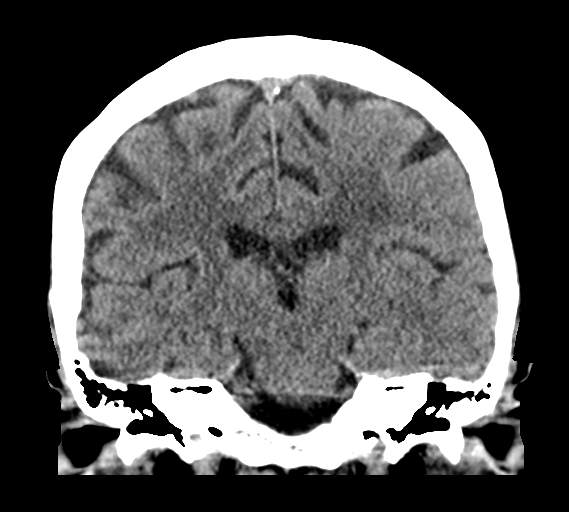

[Series 6: sagittal soft tissue · sagittal · 0.30mm/px · 3 of 59 slices shown]
[im 20/59  brain]
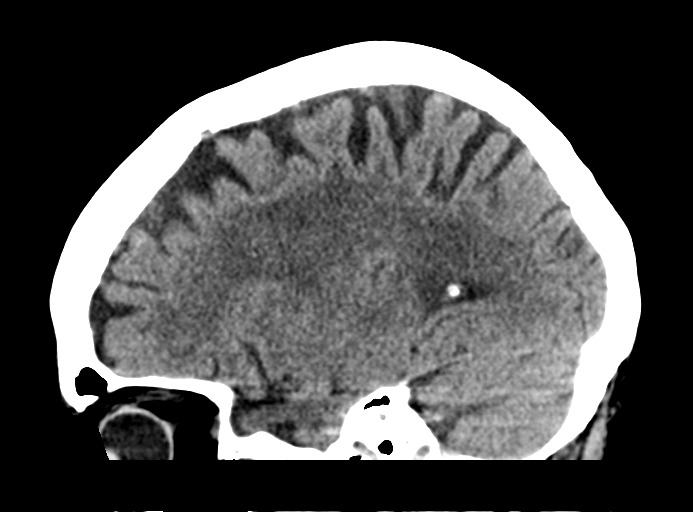
[im 30/59  brain]
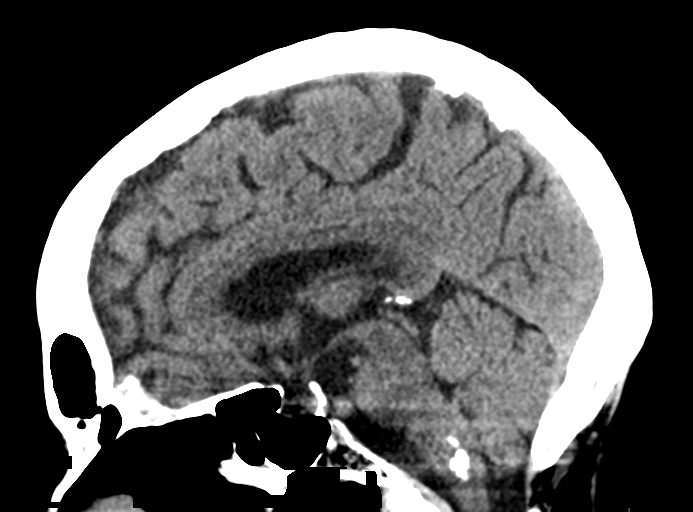
[im 39/59  brain]
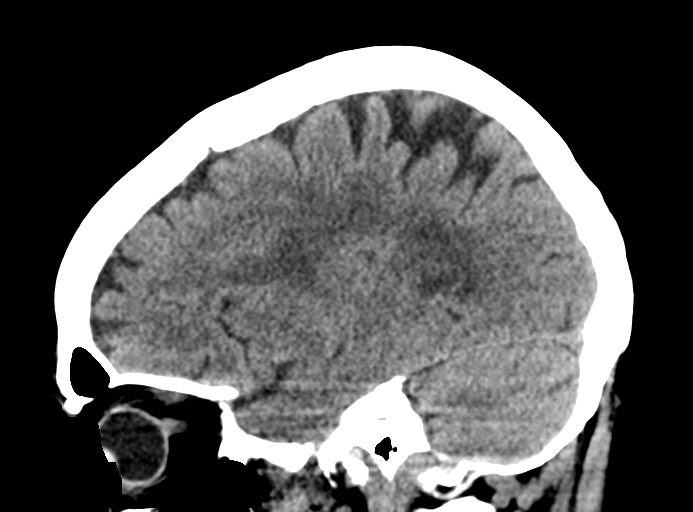

[15 of 47 positions shown; findings below may reference images not displayed]

FINDINGS: Brain: Mild chronic ischemic white matter disease is noted. Mild
diffuse cortical atrophy is noted. Old lacunar infarction is noted
in right thalamus. No mass effect or midline shift is noted.
Ventricular size is within normal limits. There is no evidence of
mass lesion, hemorrhage or acute infarction.

Vascular: No hyperdense vessel or unexpected calcification.

Skull: Normal. Negative for fracture or focal lesion.

Sinuses/Orbits: No acute finding.

Other: None.
IMPRESSION: Mild chronic ischemic white matter disease. Mild diffuse cortical
atrophy. No acute intracranial abnormality seen.

## 2020-01-18 DIAGNOSIS — N390 Urinary tract infection, site not specified: Secondary | ICD-10-CM | POA: Diagnosis not present

## 2020-01-25 ENCOUNTER — Other Ambulatory Visit: Payer: Self-pay

## 2020-01-25 ENCOUNTER — Telehealth (INDEPENDENT_AMBULATORY_CARE_PROVIDER_SITE_OTHER): Payer: Medicare Other | Admitting: Internal Medicine

## 2020-01-25 ENCOUNTER — Encounter: Payer: Self-pay | Admitting: Internal Medicine

## 2020-01-25 VITALS — BP 137/76 | HR 81 | Temp 97.5°F | Resp 18 | Ht 66.0 in | Wt 124.0 lb

## 2020-01-25 DIAGNOSIS — S72009S Fracture of unspecified part of neck of unspecified femur, sequela: Secondary | ICD-10-CM

## 2020-01-25 DIAGNOSIS — I10 Essential (primary) hypertension: Secondary | ICD-10-CM | POA: Diagnosis not present

## 2020-01-25 DIAGNOSIS — F341 Dysthymic disorder: Secondary | ICD-10-CM

## 2020-01-25 DIAGNOSIS — M810 Age-related osteoporosis without current pathological fracture: Secondary | ICD-10-CM | POA: Diagnosis not present

## 2020-01-25 MED ORDER — LORAZEPAM 0.5 MG PO TABS: ORAL_TABLET | ORAL | 1 refills | Status: DC

## 2020-01-25 MED ORDER — HYDROCODONE-ACETAMINOPHEN 5-325 MG PO TABS: 1.0000 | ORAL_TABLET | ORAL | 0 refills | Status: DC | PRN

## 2020-01-25 MED ORDER — ALENDRONATE SODIUM 70 MG PO TABS
70.0000 mg | ORAL_TABLET | ORAL | 3 refills | Status: DC
Start: 2020-01-25 — End: 2020-06-08

## 2020-01-25 NOTE — Progress Notes (Signed)
Subjective:    Patient ID: [name], female    DOB: Feb 22, 1930, 86 y.o.   MRN: [id number]  DOS:  01/25/2020 Type of visit - description: Virtual Visit via Video Note  I connected with the above patient  by a video enabled telemedicine application and verified that I am speaking with the correct person using two identifiers.   THIS ENCOUNTER IS A VIRTUAL VISIT DUE TO COVID-19 - PATIENT WAS NOT SEEN IN THE OFFICE. PATIENT HAS CONSENTED TO VIRTUAL VISIT / TELEMEDICINE VISIT   Location of patient: home  Location of provider: office  Persons participating in the virtual visit: patient, provider   I discussed the limitations of evaluation and management by telemedicine and the availability of in person appointments. The patient expressed understanding and agreed to proceed.  Follow-up Since the last office visit had 2 admissions: Extensive chart review.  Admitted to the hospital 11/04/2019, discharge 11/12/2019. Had a mechanical fall, intracranial hemorrhage complicated by a seizure. Multiple rib fractures. T4 compression fracture. She saw neurosurgery, neurology and trauma.   Subsequently was admitted again on  12/24/2019, discharged to SNF 4 days later. Admitted with left hip fracture, s/p ORIF 12/25/2019. Was on 2 aspirins daily for DVT prophylaxis. Other problems were stable. They recommended follow-up BMP and CBC.  Review of Systems Since the second admission, she went to SNF, labs were done. She now is at a assisted living facility. Overall doing well. For pain takes occasional hydrocodone Currently with no constipation. I did noted weight loss, the daughter who is in the call confirms it. She thinks is because poor appetite and poor p.o. intake due to nausea and vomiting but that seems to be resolved here lately. Mentally she is doing okay. No fever chills.   Past Medical History:  Diagnosis Date  . CAD (coronary artery disease)    mild nonobstructive  disease on cath in 2003  . Cancer ([place])   . Cardiomyopathy    Probable Takotsubo, severe CP w/ normal cath in 1994. Severe CP in 2003 w/ widespread T wave inversions on ECG. Cath w/ minimal coronary disease but LV-gram showed periapical severe hypokinesis and basilar hyperkinesia (EF 40%). Last echo in 4/09 confirmed full LV functional recovery with EF 60%, no regional wall motion abnormalities, mild to moderate LVH.  Marland Kitchen Chronic diastolic CHF (congestive heart failure) ([place]) 09/13/2019  . CVA (cerebral infarction)   . Depression with anxiety 03-29-11   lost husband 3'09  . Diverticulitis   . DVT (deep venous thrombosis) ([place])    after venous ablation, R leg  . Dyspnea   . E. coli gastroenteritis 03-29-11   8'10  . Fracture 09/10/07   L2, status post vertebroplasty of L2 performed by IR  . GERD (gastroesophageal reflux disease)   . Headache(784.0)    migraines  . Hemorrhoids   . Hiatal hernia 03-29-11   no nerve problems  . Hyperlipidemia   . Hyperplastic colon polyp 06/2001  . Hypertension   . Iron deficiency anemia due to chronic blood loss 12/30/2017  . OA (osteoarthritis) of knee 03-29-11   w/ bilateral knee pain-not a problem now  . OSA (obstructive sleep apnea) 03-29-11   no cpap used, not a problem now.  . Osteoporosis   . Otalgia of both ears    Dr. Simeon Craft  . Polycythemia   . Stroke [facility]) 03-29-11   CVA x2 -last 10'12-?TIA(visual problems)  . Varicose vein     Past Surgical History:  Procedure Laterality Date  .  APPENDECTOMY    . BACK SURGERY    . BREAST BIOPSY    . BREAST CYST EXCISION    . BREAST LUMPECTOMY Left 2013   left stage I left breast cancer  . CHOLECYSTECTOMY  04/09/2011   Procedure: LAPAROSCOPIC CHOLECYSTECTOMY WITH INTRAOPERATIVE CHOLANGIOGRAM;  Surgeon: Odis Hollingshead, MD;  Location: [facility];  Service: General;  Laterality: N/A;  . Dental Extraction     L maxillary molar  . INTRAMEDULLARY (IM) NAIL INTERTROCHANTERIC Left 12/25/2019   Procedure:  INTRAMEDULLARY (IM) NAIL INTERTROCHANTRIC;  Surgeon: Paralee Cancel, MD;  Location: [facility];  Service: Orthopedics;  Laterality: Left;  . IR KYPHO THORACIC WITH BONE BIOPSY  09/15/2019  . IR VERTEBROPLASTY CERV/THOR BX INC UNI/BIL INC/INJECT/IMAGING  11/10/2019  . KYPHOSIS SURGERY  08/2007   cement used  . OVARIAN CYST SURGERY     left  . RADIOLOGY WITH ANESTHESIA N/A 11/10/2019   Procedure: VERTEBROPLASTY;  Surgeon: Luanne Bras, MD;  Location: [facility];  Service: Radiology;  Laterality: N/A;  . TONSILLECTOMY    . TOTAL KNEE ARTHROPLASTY  06/2010   left  . TUBAL LIGATION      Allergies as of 01/25/2020      Reactions   Penicillins Hives, Rash, Other (See Comments)   Has patient had a PCN reaction causing immediate rash, facial/tongue/throat swelling, SOB or lightheadedness with hypotension: Yes Has patient had a PCN reaction causing severe rash involving mucus membranes or skin necrosis: yes Has patient had a PCN reaction that required hospitalization: No Has patient had a PCN reaction occurring within the last 10 years: no If all of the above answers are "NO", then may proceed with Cephalosporin use. Other Reaction: OTHER REACTION   Valsartan Itching, Other (See Comments)   Atorvastatin Diarrhea   Carvedilol Other (See Comments)   "teeth hurt when I took it"   Ezetimibe Nausea Only   Fluvastatin Sodium Other (See Comments)   "Can't remember"   Magnesium Hydroxide Other (See Comments)   REACTION: triggers HAs   Meloxicam Other (See Comments)   Pt seeing auro's / spots.     Pneumovax [pneumococcal Polysaccharide Vaccine] Other (See Comments)   Local reaction   Quinapril Hcl Other (See Comments)    "feelings of tiredness"   Simvastatin Diarrhea   Topamax [topiramate] Itching   Vit D-vit E-safflower Oil Other (See Comments)   Headaches      Medication List       Accurate as of January 25, 2020  4:40 PM. If you have any questions, ask your nurse or doctor.         acetaminophen 500 MG tablet Commonly known as: TYLENOL Take 1,000 mg by mouth every 8 (eight) hours as needed for mild pain or moderate pain. NTE 6 tabs/24 hours   aspirin 81 MG EC tablet Take 1 tablet (81 mg total) by mouth 2 (two) times daily. Swallow whole.   bisacodyl 10 MG suppository Commonly known as: DULCOLAX Place 1 suppository (10 mg total) rectally daily as needed for moderate constipation.   bismuth subsalicylate 496 PR/91MB suspension Commonly known as: PEPTO BISMOL Take 30 mLs by mouth every 4 (four) hours as needed for indigestion (heartburn).   Dermacloud Crea Apply 1 application topically See admin instructions. Apply to buttocks topically every shift for rash   docusate sodium 100 MG capsule Commonly known as: COLACE Take 100 mg by mouth 2 (two) times daily.   HYDROcodone-acetaminophen 5-325 MG tablet Commonly known as: NORCO/VICODIN Take 1-2 tablets by mouth  every 4 (four) hours as needed for moderate pain (pain score 4-6). What changed: when to take this   hydroxyurea 500 MG capsule Commonly known as: HYDREA Take 1 capsule (500 mg total) by mouth every other day. May take with food to minimize GI side effects.   lactose free nutrition Liqd Take 237 mLs by mouth See admin instructions. Every shift   levETIRAcetam 500 MG tablet Commonly known as: KEPPRA Take 1 tablet (500 mg total) by mouth 2 (two) times daily.   Lidocaine 4 % Ptch LIDOCAINE 4% PATCH :Apply 1 patch topically daily and remove after 12 hours to left lateral chest What changed:   how much to take  how to take this  when to take this  additional instructions   LORazepam 0.5 MG tablet Commonly known as: ATIVAN Take 0.5 tablets (0.25 mg total) by mouth 2 (two) times daily with a meal. What changed:   how much to take  how to take this  when to take this  additional instructions  Another medication with the same name was removed. Continue taking this medication, and follow  the directions you see here.   metoprolol tartrate 50 MG tablet Commonly known as: LOPRESSOR Take 1 tablet (50 mg total) by mouth 2 (two) times daily.   multivitamin with minerals Tabs tablet Take 1 tablet by mouth daily.   omeprazole 20 MG capsule Commonly known as: PRILOSEC Take 1 capsule (20 mg total) by mouth daily.   ondansetron 4 MG tablet Commonly known as: ZOFRAN Take 1 tablet (4 mg total) by mouth every 6 (six) hours as needed for nausea or vomiting.   polyethylene glycol 17 g packet Commonly known as: MIRALAX / GLYCOLAX Take 17 g by mouth daily.   tiZANidine 2 MG tablet Commonly known as: ZANAFLEX Take 2 mg by mouth every 6 (six) hours as needed for muscle spasms.          Objective:   Physical Exam BP 137/76   Pulse 81   Temp (!) 97.5 F (36.4 C) (Oral)   Resp 18   Ht 5\' 6"  (1.676 m)   Wt 124 lb (56.2 kg) Comment: Not weight- broken hip  BMI 20.01 kg/m  This is a virtual video visit, she is alert oriented x3, in no distress.  The daughter is present on the call.  She seems thinner than before.    Assessment      Assessment  HTN Dyslipidemia: Intolerant to most medicines including Zetia, Pravachol, Niaspan Depression, anxiety , insomnia - on ativan bid (03-2015 citalopram cause drowsiness, 04-2015 prozac:diarrhea) Hem-Onc: POLYCYTEMIA: sees hem-onc H/o breast ca  MSK: ---DJD :  Tylenol #3, uses rarely ---Osteoporosis:  T score -2.8 (02-2013): declined treatment 06-2017 ---L2 fractures, s/p vertebral plasty 2009 Migraine headaches - Tylenol #3 (rarely)  GI: GERD- intolerant to prevacid, visual disturbances HH, diverticulitis, Colon polyps  h/o "functional " LLQ abd pain CV: ---Cardiomyopathy Probable Takotsubo cardiomyopathy: CP w/ normal cath in 1994.   Severe CP  2003 with widespread T wave inversions on ECG.  Cath with minimal coronary disease but LV-gram showed periapical severe hypokinesis and basilar hyperkinesia (EF 40%) >>> ECHO 4/09  confirmed full LV functional recovery with EF 60%, mild LVH. 2012: Nl EF per echo, (-) Lexiscan 05/21/18 ECHO Nl/stable ---Stroke / TIA 2012 Sleep apnea 2012, , not on CPAP H/oDVT, right leg after venous ablation Raynaud phenomena, DX 05/2018  PLAN Since the last visit, had 2 admissions to the hospital. H/o intracranial bleeding  and seizure: Admitted to the hospital 10/2019, currently asymptomatic, on Keppra. Hip fracture: Admitted to the hospital again 12/24/2019 with a hip fracture, was operated on, went to SNF, now on assisted living.  Seems to be doing well, occasionally takes hydrocodone, needs a refill. Aspirin 81 mg: Taking it twice a day for DVT prophylaxis assess for fracture, okay to go down to 1 tablet daily HTN: Currently on metoprolol.  Reportedly BP is okay. Depression, anxiety, insomnia: On Ativan 1/2 tab B.I.D. and 1 qhs. with good control of symptoms.RF sent Osteoporosis: Declined Prolia, we eventually agreed on Fosamax withGI precautions.  Prescription sent. Weight loss: Significant in the last 3 months, daughter reports is probably from nausea-vomiting, although symptoms are improved and she is eating some better.  Reassess on RTC. Paperwork completed, extensive medical records review and med reconciliation performed.  RTC 3 months    I discussed the assessment and treatment plan with the patient. The patient was provided an opportunity to ask questions and all were answered. The patient agreed with the plan and demonstrated an understanding of the instructions.   The patient was advised to call back or seek an in-person evaluation if the symptoms worsen or if the condition fails to improve as anticipated.

## 2020-01-25 NOTE — Progress Notes (Signed)
Pre visit review using our clinic review tool, if applicable. No additional management support is needed unless otherwise documented below in the visit note. 

## 2020-01-26 ENCOUNTER — Telehealth: Payer: Self-pay

## 2020-01-26 MED ORDER — LORAZEPAM 0.5 MG PO TABS: ORAL_TABLET | ORAL | 1 refills | Status: DC

## 2020-01-26 NOTE — Telephone Encounter (Signed)
Spoke w/ Early Chars- she is needing the as needed removed from the sig for lorazepam as the nurses at [facility] is giving them a hard time at bedtime with Pt's meds. Informed I'd have PCP resend w/o prn at bedtime and change to at bedtime.

## 2020-01-26 NOTE — Assessment & Plan Note (Signed)
Since the last visit, had 2 admissions to the hospital. H/o intracranial bleeding and seizure: Admitted to the hospital 10/2019, currently asymptomatic, on Keppra. Hip fracture: Admitted to the hospital again 12/24/2019 with a hip fracture, was operated on, went to SNF, now on assisted living.  Seems to be doing well, occasionally takes hydrocodone, needs a refill. Aspirin 81 mg: Taking it twice a day for DVT prophylaxis assess for fracture, okay to go down to 1 tablet daily HTN: Currently on metoprolol.  Reportedly BP is okay. Depression, anxiety, insomnia: On Ativan 1/2 tab B.I.D. and 1 qhs. with good control of symptoms.RF sent Osteoporosis: Declined Prolia, we eventually agreed on Fosamax withGI precautions.  Prescription sent. Weight loss: Significant in the last 3 months, daughter reports is probably from nausea-vomiting, although symptoms are improved and she is eating some better.  Reassess on RTC. Paperwork completed, extensive medical records review and med reconciliation performed.  RTC 3 months

## 2020-01-26 NOTE — Telephone Encounter (Signed)
New prescription sent

## 2020-01-26 NOTE — Telephone Encounter (Addendum)
Please clarify: Lorazepam has always been 0.5 mg when I prescribe it, apparently dose increased by somebody else last month. Advised the daughter I recommend to stick with the previous lower dose, higher doses increased risk of fall. I will correct the nightly dose from as needed to every night.

## 2020-01-26 NOTE — Telephone Encounter (Signed)
Spoke with pt's daughter, Early Chars, this morning and she is wondering why pt's Ativan has been decreased.  She stated it was a higher MG in the past.  She also stated the dosing instructions need to state at bedtime as scheduled and not as requested/needed.  Call back number for Early Chars is [phone].

## 2020-01-28 DIAGNOSIS — R2681 Unsteadiness on feet: Secondary | ICD-10-CM | POA: Diagnosis not present

## 2020-01-28 DIAGNOSIS — R262 Difficulty in walking, not elsewhere classified: Secondary | ICD-10-CM | POA: Diagnosis not present

## 2020-01-28 DIAGNOSIS — M6281 Muscle weakness (generalized): Secondary | ICD-10-CM | POA: Diagnosis not present

## 2020-01-29 DIAGNOSIS — R41841 Cognitive communication deficit: Secondary | ICD-10-CM | POA: Diagnosis not present

## 2020-01-29 DIAGNOSIS — M6281 Muscle weakness (generalized): Secondary | ICD-10-CM | POA: Diagnosis not present

## 2020-01-29 DIAGNOSIS — R262 Difficulty in walking, not elsewhere classified: Secondary | ICD-10-CM | POA: Diagnosis not present

## 2020-01-29 DIAGNOSIS — R296 Repeated falls: Secondary | ICD-10-CM | POA: Diagnosis not present

## 2020-01-29 DIAGNOSIS — R2681 Unsteadiness on feet: Secondary | ICD-10-CM | POA: Diagnosis not present

## 2020-01-29 DIAGNOSIS — R4701 Aphasia: Secondary | ICD-10-CM | POA: Diagnosis not present

## 2020-01-29 DIAGNOSIS — R1312 Dysphagia, oropharyngeal phase: Secondary | ICD-10-CM | POA: Diagnosis not present

## 2020-01-29 DIAGNOSIS — R1314 Dysphagia, pharyngoesophageal phase: Secondary | ICD-10-CM | POA: Diagnosis not present

## 2020-01-29 DIAGNOSIS — N3946 Mixed incontinence: Secondary | ICD-10-CM | POA: Diagnosis not present

## 2020-02-01 DIAGNOSIS — R1312 Dysphagia, oropharyngeal phase: Secondary | ICD-10-CM | POA: Diagnosis not present

## 2020-02-01 DIAGNOSIS — R41841 Cognitive communication deficit: Secondary | ICD-10-CM | POA: Diagnosis not present

## 2020-02-01 DIAGNOSIS — R262 Difficulty in walking, not elsewhere classified: Secondary | ICD-10-CM | POA: Diagnosis not present

## 2020-02-01 DIAGNOSIS — R2681 Unsteadiness on feet: Secondary | ICD-10-CM | POA: Diagnosis not present

## 2020-02-01 DIAGNOSIS — M6281 Muscle weakness (generalized): Secondary | ICD-10-CM | POA: Diagnosis not present

## 2020-02-01 DIAGNOSIS — R1314 Dysphagia, pharyngoesophageal phase: Secondary | ICD-10-CM | POA: Diagnosis not present

## 2020-02-02 DIAGNOSIS — M6281 Muscle weakness (generalized): Secondary | ICD-10-CM | POA: Diagnosis not present

## 2020-02-02 DIAGNOSIS — R41841 Cognitive communication deficit: Secondary | ICD-10-CM | POA: Diagnosis not present

## 2020-02-02 DIAGNOSIS — R262 Difficulty in walking, not elsewhere classified: Secondary | ICD-10-CM | POA: Diagnosis not present

## 2020-02-02 DIAGNOSIS — R1314 Dysphagia, pharyngoesophageal phase: Secondary | ICD-10-CM | POA: Diagnosis not present

## 2020-02-02 DIAGNOSIS — R2681 Unsteadiness on feet: Secondary | ICD-10-CM | POA: Diagnosis not present

## 2020-02-02 DIAGNOSIS — R1312 Dysphagia, oropharyngeal phase: Secondary | ICD-10-CM | POA: Diagnosis not present

## 2020-02-03 ENCOUNTER — Telehealth: Payer: Self-pay

## 2020-02-03 DIAGNOSIS — R1312 Dysphagia, oropharyngeal phase: Secondary | ICD-10-CM | POA: Diagnosis not present

## 2020-02-03 DIAGNOSIS — R2681 Unsteadiness on feet: Secondary | ICD-10-CM | POA: Diagnosis not present

## 2020-02-03 DIAGNOSIS — R1314 Dysphagia, pharyngoesophageal phase: Secondary | ICD-10-CM | POA: Diagnosis not present

## 2020-02-03 DIAGNOSIS — M6281 Muscle weakness (generalized): Secondary | ICD-10-CM | POA: Diagnosis not present

## 2020-02-03 DIAGNOSIS — R41841 Cognitive communication deficit: Secondary | ICD-10-CM | POA: Diagnosis not present

## 2020-02-03 DIAGNOSIS — R262 Difficulty in walking, not elsewhere classified: Secondary | ICD-10-CM | POA: Diagnosis not present

## 2020-02-03 NOTE — Telephone Encounter (Signed)
ST Plan of care signed and faxed back to [facility] at [phone]. Form sent for scanning.

## 2020-02-04 DIAGNOSIS — R262 Difficulty in walking, not elsewhere classified: Secondary | ICD-10-CM | POA: Diagnosis not present

## 2020-02-04 DIAGNOSIS — R1312 Dysphagia, oropharyngeal phase: Secondary | ICD-10-CM | POA: Diagnosis not present

## 2020-02-04 DIAGNOSIS — R41841 Cognitive communication deficit: Secondary | ICD-10-CM | POA: Diagnosis not present

## 2020-02-04 DIAGNOSIS — R2681 Unsteadiness on feet: Secondary | ICD-10-CM | POA: Diagnosis not present

## 2020-02-04 DIAGNOSIS — M6281 Muscle weakness (generalized): Secondary | ICD-10-CM | POA: Diagnosis not present

## 2020-02-04 DIAGNOSIS — R1314 Dysphagia, pharyngoesophageal phase: Secondary | ICD-10-CM | POA: Diagnosis not present

## 2020-02-05 DIAGNOSIS — M6281 Muscle weakness (generalized): Secondary | ICD-10-CM | POA: Diagnosis not present

## 2020-02-05 DIAGNOSIS — R41841 Cognitive communication deficit: Secondary | ICD-10-CM | POA: Diagnosis not present

## 2020-02-05 DIAGNOSIS — R2681 Unsteadiness on feet: Secondary | ICD-10-CM | POA: Diagnosis not present

## 2020-02-05 DIAGNOSIS — R1312 Dysphagia, oropharyngeal phase: Secondary | ICD-10-CM | POA: Diagnosis not present

## 2020-02-05 DIAGNOSIS — R1314 Dysphagia, pharyngoesophageal phase: Secondary | ICD-10-CM | POA: Diagnosis not present

## 2020-02-05 DIAGNOSIS — R262 Difficulty in walking, not elsewhere classified: Secondary | ICD-10-CM | POA: Diagnosis not present

## 2020-02-08 ENCOUNTER — Telehealth: Payer: Self-pay | Admitting: Internal Medicine

## 2020-02-08 DIAGNOSIS — M6281 Muscle weakness (generalized): Secondary | ICD-10-CM | POA: Diagnosis not present

## 2020-02-08 DIAGNOSIS — R2681 Unsteadiness on feet: Secondary | ICD-10-CM | POA: Diagnosis not present

## 2020-02-08 DIAGNOSIS — R1312 Dysphagia, oropharyngeal phase: Secondary | ICD-10-CM | POA: Diagnosis not present

## 2020-02-08 DIAGNOSIS — R262 Difficulty in walking, not elsewhere classified: Secondary | ICD-10-CM | POA: Diagnosis not present

## 2020-02-08 DIAGNOSIS — R41841 Cognitive communication deficit: Secondary | ICD-10-CM | POA: Diagnosis not present

## 2020-02-08 DIAGNOSIS — R1314 Dysphagia, pharyngoesophageal phase: Secondary | ICD-10-CM | POA: Diagnosis not present

## 2020-02-08 NOTE — Telephone Encounter (Signed)
Pt's family member [name]) send through email documents to be filled out by provider ([place] -3 pages) Please call when document ready at [phone] [facility] tel). Document put at front office tray under providers name.

## 2020-02-08 NOTE — Telephone Encounter (Signed)
FYI

## 2020-02-08 NOTE — Telephone Encounter (Signed)
Spoke with the patient: She feels she is ready to go home, states that the place she is at is very expensive and she is feeling depressed being away from her home.  She reports some suicidal ideas as well. I advised patient that she had 2 major falls, that I am not in a position to "clear" her to go home but I really suspect she is not ready.  Subsequently I spoke with the [name], she thinks that [name] is is as good as she is going to get and she is not ready to be by herself. Plan: -I will sign some documents that they need to get assistance from the [place] to pay for the facility she is at. -[name] is in the process of getting [place] please call [facility] and let them know [name] is having occasional suicidal thoughts and needs to be check.  (I asked the daughter to the same)

## 2020-02-08 NOTE — Telephone Encounter (Signed)
Form completed and placed in PCP red folder to be signed when he returns to office.

## 2020-02-08 NOTE — Telephone Encounter (Signed)
Patient states she wants to go home. "just give him the message he would understand"

## 2020-02-09 DIAGNOSIS — R2681 Unsteadiness on feet: Secondary | ICD-10-CM | POA: Diagnosis not present

## 2020-02-09 DIAGNOSIS — R1312 Dysphagia, oropharyngeal phase: Secondary | ICD-10-CM | POA: Diagnosis not present

## 2020-02-09 DIAGNOSIS — R1314 Dysphagia, pharyngoesophageal phase: Secondary | ICD-10-CM | POA: Diagnosis not present

## 2020-02-09 DIAGNOSIS — R262 Difficulty in walking, not elsewhere classified: Secondary | ICD-10-CM | POA: Diagnosis not present

## 2020-02-09 DIAGNOSIS — R41841 Cognitive communication deficit: Secondary | ICD-10-CM | POA: Diagnosis not present

## 2020-02-09 DIAGNOSIS — M6281 Muscle weakness (generalized): Secondary | ICD-10-CM | POA: Diagnosis not present

## 2020-02-09 NOTE — Telephone Encounter (Signed)
LMOM at [facility] line for nurse Anderson Malta, informed of below. Asked for call back if they have further questions/concerns.

## 2020-02-10 DIAGNOSIS — R41841 Cognitive communication deficit: Secondary | ICD-10-CM | POA: Diagnosis not present

## 2020-02-10 DIAGNOSIS — R1314 Dysphagia, pharyngoesophageal phase: Secondary | ICD-10-CM | POA: Diagnosis not present

## 2020-02-10 DIAGNOSIS — R1312 Dysphagia, oropharyngeal phase: Secondary | ICD-10-CM | POA: Diagnosis not present

## 2020-02-10 DIAGNOSIS — R262 Difficulty in walking, not elsewhere classified: Secondary | ICD-10-CM | POA: Diagnosis not present

## 2020-02-10 DIAGNOSIS — R2681 Unsteadiness on feet: Secondary | ICD-10-CM | POA: Diagnosis not present

## 2020-02-10 DIAGNOSIS — M6281 Muscle weakness (generalized): Secondary | ICD-10-CM | POA: Diagnosis not present

## 2020-02-11 DIAGNOSIS — M6281 Muscle weakness (generalized): Secondary | ICD-10-CM | POA: Diagnosis not present

## 2020-02-11 DIAGNOSIS — R262 Difficulty in walking, not elsewhere classified: Secondary | ICD-10-CM | POA: Diagnosis not present

## 2020-02-11 DIAGNOSIS — R1314 Dysphagia, pharyngoesophageal phase: Secondary | ICD-10-CM | POA: Diagnosis not present

## 2020-02-11 DIAGNOSIS — R41841 Cognitive communication deficit: Secondary | ICD-10-CM | POA: Diagnosis not present

## 2020-02-11 DIAGNOSIS — R2681 Unsteadiness on feet: Secondary | ICD-10-CM | POA: Diagnosis not present

## 2020-02-11 DIAGNOSIS — R1312 Dysphagia, oropharyngeal phase: Secondary | ICD-10-CM | POA: Diagnosis not present

## 2020-02-12 DIAGNOSIS — R2681 Unsteadiness on feet: Secondary | ICD-10-CM | POA: Diagnosis not present

## 2020-02-12 DIAGNOSIS — R262 Difficulty in walking, not elsewhere classified: Secondary | ICD-10-CM | POA: Diagnosis not present

## 2020-02-12 DIAGNOSIS — N3946 Mixed incontinence: Secondary | ICD-10-CM | POA: Diagnosis not present

## 2020-02-12 DIAGNOSIS — R41841 Cognitive communication deficit: Secondary | ICD-10-CM | POA: Diagnosis not present

## 2020-02-12 DIAGNOSIS — R1314 Dysphagia, pharyngoesophageal phase: Secondary | ICD-10-CM | POA: Diagnosis not present

## 2020-02-12 DIAGNOSIS — M6281 Muscle weakness (generalized): Secondary | ICD-10-CM | POA: Diagnosis not present

## 2020-02-12 DIAGNOSIS — R4701 Aphasia: Secondary | ICD-10-CM | POA: Diagnosis not present

## 2020-02-12 DIAGNOSIS — R296 Repeated falls: Secondary | ICD-10-CM | POA: Diagnosis not present

## 2020-02-12 DIAGNOSIS — R1312 Dysphagia, oropharyngeal phase: Secondary | ICD-10-CM | POA: Diagnosis not present

## 2020-02-15 ENCOUNTER — Telehealth: Payer: Self-pay | Admitting: Internal Medicine

## 2020-02-15 DIAGNOSIS — R1314 Dysphagia, pharyngoesophageal phase: Secondary | ICD-10-CM | POA: Diagnosis not present

## 2020-02-15 DIAGNOSIS — Z0279 Encounter for issue of other medical certificate: Secondary | ICD-10-CM

## 2020-02-15 DIAGNOSIS — R2681 Unsteadiness on feet: Secondary | ICD-10-CM | POA: Diagnosis not present

## 2020-02-15 DIAGNOSIS — M6281 Muscle weakness (generalized): Secondary | ICD-10-CM | POA: Diagnosis not present

## 2020-02-15 DIAGNOSIS — R262 Difficulty in walking, not elsewhere classified: Secondary | ICD-10-CM | POA: Diagnosis not present

## 2020-02-15 DIAGNOSIS — R1312 Dysphagia, oropharyngeal phase: Secondary | ICD-10-CM | POA: Diagnosis not present

## 2020-02-15 DIAGNOSIS — R41841 Cognitive communication deficit: Secondary | ICD-10-CM | POA: Diagnosis not present

## 2020-02-15 NOTE — Telephone Encounter (Signed)
Pt family memberJosepha Pigg) dropped of FL2 Form to be signed and filled out by [employer].  Put into [place] bin up front.  [name] would like to be called when paper is ready. [phone]

## 2020-02-15 NOTE — Telephone Encounter (Signed)
Spoke w/ Early Chars- informed form ready for pick up. Copy sent for scanning.

## 2020-02-16 DIAGNOSIS — R1314 Dysphagia, pharyngoesophageal phase: Secondary | ICD-10-CM | POA: Diagnosis not present

## 2020-02-16 DIAGNOSIS — R2681 Unsteadiness on feet: Secondary | ICD-10-CM | POA: Diagnosis not present

## 2020-02-16 DIAGNOSIS — R262 Difficulty in walking, not elsewhere classified: Secondary | ICD-10-CM | POA: Diagnosis not present

## 2020-02-16 DIAGNOSIS — R41841 Cognitive communication deficit: Secondary | ICD-10-CM | POA: Diagnosis not present

## 2020-02-16 DIAGNOSIS — M6281 Muscle weakness (generalized): Secondary | ICD-10-CM | POA: Diagnosis not present

## 2020-02-16 DIAGNOSIS — R1312 Dysphagia, oropharyngeal phase: Secondary | ICD-10-CM | POA: Diagnosis not present

## 2020-02-16 NOTE — Telephone Encounter (Signed)
FL2 form completed, placed in PCP red folder for signing.

## 2020-02-17 DIAGNOSIS — R2681 Unsteadiness on feet: Secondary | ICD-10-CM | POA: Diagnosis not present

## 2020-02-17 DIAGNOSIS — M25562 Pain in left knee: Secondary | ICD-10-CM | POA: Diagnosis not present

## 2020-02-17 DIAGNOSIS — M25552 Pain in left hip: Secondary | ICD-10-CM | POA: Diagnosis not present

## 2020-02-17 DIAGNOSIS — R1312 Dysphagia, oropharyngeal phase: Secondary | ICD-10-CM | POA: Diagnosis not present

## 2020-02-17 DIAGNOSIS — R1314 Dysphagia, pharyngoesophageal phase: Secondary | ICD-10-CM | POA: Diagnosis not present

## 2020-02-17 DIAGNOSIS — M6281 Muscle weakness (generalized): Secondary | ICD-10-CM | POA: Diagnosis not present

## 2020-02-17 DIAGNOSIS — R41841 Cognitive communication deficit: Secondary | ICD-10-CM | POA: Diagnosis not present

## 2020-02-17 DIAGNOSIS — Z96652 Presence of left artificial knee joint: Secondary | ICD-10-CM | POA: Diagnosis not present

## 2020-02-17 DIAGNOSIS — R262 Difficulty in walking, not elsewhere classified: Secondary | ICD-10-CM | POA: Diagnosis not present

## 2020-02-18 ENCOUNTER — Other Ambulatory Visit: Payer: Self-pay | Admitting: Internal Medicine

## 2020-02-18 DIAGNOSIS — M6281 Muscle weakness (generalized): Secondary | ICD-10-CM | POA: Diagnosis not present

## 2020-02-18 DIAGNOSIS — R41841 Cognitive communication deficit: Secondary | ICD-10-CM | POA: Diagnosis not present

## 2020-02-18 DIAGNOSIS — R1312 Dysphagia, oropharyngeal phase: Secondary | ICD-10-CM | POA: Diagnosis not present

## 2020-02-18 DIAGNOSIS — R262 Difficulty in walking, not elsewhere classified: Secondary | ICD-10-CM | POA: Diagnosis not present

## 2020-02-18 DIAGNOSIS — R1314 Dysphagia, pharyngoesophageal phase: Secondary | ICD-10-CM | POA: Diagnosis not present

## 2020-02-18 DIAGNOSIS — R2681 Unsteadiness on feet: Secondary | ICD-10-CM | POA: Diagnosis not present

## 2020-02-18 MED ORDER — HYDROCODONE-ACETAMINOPHEN 5-325 MG PO TABS: 1.0000 | ORAL_TABLET | ORAL | 0 refills | Status: DC | PRN

## 2020-02-18 NOTE — Telephone Encounter (Signed)
Spoke w/ Early Chars- informed that FL2 form is ready for pick up. Copy sent for scanning.

## 2020-02-19 ENCOUNTER — Other Ambulatory Visit: Payer: Self-pay

## 2020-02-19 ENCOUNTER — Emergency Department (HOSPITAL_COMMUNITY)
Admission: EM | Admit: 2020-02-19 | Discharge: 2020-02-19 | Disposition: A | Discharge status: Home / Self Care | Payer: Medicare Other | Attending: Emergency Medicine | Admitting: Emergency Medicine

## 2020-02-19 ENCOUNTER — Encounter (HOSPITAL_COMMUNITY): Payer: Self-pay | Admitting: Emergency Medicine

## 2020-02-19 ENCOUNTER — Emergency Department (HOSPITAL_COMMUNITY): Payer: Medicare Other

## 2020-02-19 DIAGNOSIS — S0990XA Unspecified injury of head, initial encounter: Secondary | ICD-10-CM

## 2020-02-19 DIAGNOSIS — Z79899 Other long term (current) drug therapy: Secondary | ICD-10-CM | POA: Diagnosis not present

## 2020-02-19 DIAGNOSIS — I110 Hypertensive heart disease with heart failure: Secondary | ICD-10-CM | POA: Diagnosis not present

## 2020-02-19 DIAGNOSIS — Z743 Need for continuous supervision: Secondary | ICD-10-CM | POA: Diagnosis not present

## 2020-02-19 DIAGNOSIS — Z86010 Personal history of colonic polyps: Secondary | ICD-10-CM | POA: Diagnosis not present

## 2020-02-19 DIAGNOSIS — M47812 Spondylosis without myelopathy or radiculopathy, cervical region: Secondary | ICD-10-CM | POA: Diagnosis not present

## 2020-02-19 DIAGNOSIS — I2510 Atherosclerotic heart disease of native coronary artery without angina pectoris: Secondary | ICD-10-CM | POA: Insufficient documentation

## 2020-02-19 DIAGNOSIS — I5032 Chronic diastolic (congestive) heart failure: Secondary | ICD-10-CM | POA: Diagnosis not present

## 2020-02-19 DIAGNOSIS — W19XXXA Unspecified fall, initial encounter: Secondary | ICD-10-CM | POA: Diagnosis not present

## 2020-02-19 DIAGNOSIS — Z87891 Personal history of nicotine dependence: Secondary | ICD-10-CM | POA: Insufficient documentation

## 2020-02-19 DIAGNOSIS — S0101XA Laceration without foreign body of scalp, initial encounter: Secondary | ICD-10-CM | POA: Diagnosis not present

## 2020-02-19 DIAGNOSIS — Z7982 Long term (current) use of aspirin: Secondary | ICD-10-CM | POA: Insufficient documentation

## 2020-02-19 DIAGNOSIS — Z96652 Presence of left artificial knee joint: Secondary | ICD-10-CM | POA: Insufficient documentation

## 2020-02-19 DIAGNOSIS — R9082 White matter disease, unspecified: Secondary | ICD-10-CM | POA: Diagnosis not present

## 2020-02-19 DIAGNOSIS — S199XXA Unspecified injury of neck, initial encounter: Secondary | ICD-10-CM | POA: Diagnosis not present

## 2020-02-19 DIAGNOSIS — I499 Cardiac arrhythmia, unspecified: Secondary | ICD-10-CM | POA: Diagnosis not present

## 2020-02-19 DIAGNOSIS — M6281 Muscle weakness (generalized): Secondary | ICD-10-CM | POA: Diagnosis not present

## 2020-02-19 DIAGNOSIS — W01198A Fall on same level from slipping, tripping and stumbling with subsequent striking against other object, initial encounter: Secondary | ICD-10-CM | POA: Diagnosis not present

## 2020-02-19 DIAGNOSIS — I6389 Other cerebral infarction: Secondary | ICD-10-CM | POA: Diagnosis not present

## 2020-02-19 DIAGNOSIS — Z853 Personal history of malignant neoplasm of breast: Secondary | ICD-10-CM | POA: Diagnosis not present

## 2020-02-19 DIAGNOSIS — R41841 Cognitive communication deficit: Secondary | ICD-10-CM | POA: Diagnosis not present

## 2020-02-19 DIAGNOSIS — R279 Unspecified lack of coordination: Secondary | ICD-10-CM | POA: Diagnosis not present

## 2020-02-19 DIAGNOSIS — R1312 Dysphagia, oropharyngeal phase: Secondary | ICD-10-CM | POA: Diagnosis not present

## 2020-02-19 DIAGNOSIS — R2681 Unsteadiness on feet: Secondary | ICD-10-CM | POA: Diagnosis not present

## 2020-02-19 DIAGNOSIS — R1314 Dysphagia, pharyngoesophageal phase: Secondary | ICD-10-CM | POA: Diagnosis not present

## 2020-02-19 DIAGNOSIS — R262 Difficulty in walking, not elsewhere classified: Secondary | ICD-10-CM | POA: Diagnosis not present

## 2020-02-19 DIAGNOSIS — G9389 Other specified disorders of brain: Secondary | ICD-10-CM | POA: Diagnosis not present

## 2020-02-19 MED ORDER — DOUBLE ANTIBIOTIC 500-10000 UNIT/GM EX OINT
TOPICAL_OINTMENT | Freq: Once | CUTANEOUS | Status: AC
  Filled 2020-02-19: qty 28.4

## 2020-02-19 NOTE — ED Notes (Signed)
Attached purewick to patient

## 2020-02-19 NOTE — ED Notes (Signed)
[facility] called for pt transport. 

## 2020-02-19 NOTE — ED Triage Notes (Signed)
86 yo female [place] from [facility]. Facility called for pt fall. Pt was in restroom adjusting her compression stockings and lost her balance fell and hit the right rear aspect of her head on the shower wall. Fall was unwitnessed. Pt was found sitting up holding a towel on her head. PT AOx4 which is pt baseline per EMS.    Vitals: Bp 131/84 Hr 74 rr 18 spo2 98 % ra

## 2020-02-19 NOTE — ED Notes (Signed)
[name] called and given an update

## 2020-02-19 NOTE — ED Provider Notes (Signed)
[facility] DEPT Provider Note   CSN: [id number] Arrival date & time: 02/19/20  1820     History Chief Complaint  Patient presents with  . Fall    Bridgid Belissa Kooy is a 86 y.o. female.  Patient is a 86 year old female with a history of CHF, coronary artery disease, prior DVT, hypertension and hyperlipidemia who presents after a fall.  She lives at [facility].  She was in the bathroom and was trying to get to her wheelchair.  The wheelchair slipped and she fell backward hitting her head.  There is no loss of consciousness.  She has a small laceration to the back of her head.  She denies any neck or back pain.  No fevers or other recent illnesses.  No symptoms preceding the fall.  She says that she just slipped and fell.  She denies any other injuries from the fall.  She states her tetanus shot is up-to-date.        Past Medical History:  Diagnosis Date  . CAD (coronary artery disease)    mild nonobstructive disease on cath in 2003  . Cancer ([place])   . Cardiomyopathy    Probable Takotsubo, severe CP w/ normal cath in 1994. Severe CP in 2003 w/ widespread T wave inversions on ECG. Cath w/ minimal coronary disease but LV-gram showed periapical severe hypokinesis and basilar hyperkinesia (EF 40%). Last echo in 4/09 confirmed full LV functional recovery with EF 60%, no regional wall motion abnormalities, mild to moderate LVH.  Marland Kitchen Chronic diastolic CHF (congestive heart failure) ([place]) 09/13/2019  . CVA (cerebral infarction)   . Depression with anxiety 03-29-11   lost husband 3'09  . Diverticulitis   . DVT (deep venous thrombosis) ([place])    after venous ablation, R leg  . Dyspnea   . E. coli gastroenteritis 03-29-11   8'10  . Fracture 09/10/07   L2, status post vertebroplasty of L2 performed by IR  . GERD (gastroesophageal reflux disease)   . Headache(784.0)    migraines  . Hemorrhoids   . Hiatal hernia 03-29-11   no nerve problems  .  Hyperlipidemia   . Hyperplastic colon polyp 06/2001  . Hypertension   . Iron deficiency anemia due to chronic blood loss 12/30/2017  . OA (osteoarthritis) of knee 03-29-11   w/ bilateral knee pain-not a problem now  . OSA (obstructive sleep apnea) 03-29-11   no cpap used, not a problem now.  . Osteoporosis   . Otalgia of both ears    Dr. Simeon Craft  . Polycythemia   . Stroke [facility]) 03-29-11   CVA x2 -last 10'12-?TIA(visual problems)  . Varicose vein     Patient Active Problem List   Diagnosis Date Noted  . Malnutrition of moderate degree 12/27/2019  . Pressure injury of skin 12/27/2019  . S/p left hip fracture   . Comminuted fracture of hip, left, open type I or II, initial encounter ([place]) 12/24/2019  . Closed left hip fracture ([place]) 12/24/2019  . ICH (intracerebral hemorrhage) ([place]) 11/04/2019  . Intractable back pain 09/13/2019  . Compression fracture of T9 vertebra ([place]) 09/13/2019  . Chronic diastolic CHF (congestive heart failure) ([place]) 09/13/2019  . Overweight (BMI 25.0-29.9) 09/13/2019  . Constipation 09/13/2019  . Raynaud's phenomenon without gangrene 05/19/2018  . Iron deficiency anemia due to chronic blood loss 12/30/2017  . Insomnia 06/25/2017  . Mild CAD 05/24/2017  . PCP NOTES >>> 02/15/2015  . Superficial thrombophlebitis 04/06/2014  . Annual physical exam 08/18/2012  .  Edema 04/14/2012  . Breast cancer ([place]) 11/05/2011  . Polycythemia 11/05/2011  . TIA (transient ischemic attack) 01/16/2011  . CARDIOMYOPATHY 08/03/2009  . DJD -- pain mgmt 08/16/2008  . ABDOMINAL PAIN, LEFT LOWER QUADRANT 07/05/2008  . GAIT DISTURBANCE 10/21/2007  . ANXIETY DEPRESSION 10/08/2007  . Hyperlipidemia 06/25/2007  . Takotsubo cardiomyopathy 06/25/2007  . GERD without esophagitis 06/25/2007  . Essential hypertension 09/30/2006  . Senile osteoporosis 09/30/2006  . Migraine with aura 09/30/2006    Past Surgical History:  Procedure Laterality Date  . APPENDECTOMY    . BACK SURGERY     . BREAST BIOPSY    . BREAST CYST EXCISION    . BREAST LUMPECTOMY Left 2013   left stage I left breast cancer  . CHOLECYSTECTOMY  04/09/2011   Procedure: LAPAROSCOPIC CHOLECYSTECTOMY WITH INTRAOPERATIVE CHOLANGIOGRAM;  Surgeon: Odis Hollingshead, MD;  Location: [facility];  Service: General;  Laterality: N/A;  . Dental Extraction     L maxillary molar  . INTRAMEDULLARY (IM) NAIL INTERTROCHANTERIC Left 12/25/2019   Procedure: INTRAMEDULLARY (IM) NAIL INTERTROCHANTRIC;  Surgeon: Paralee Cancel, MD;  Location: [facility];  Service: Orthopedics;  Laterality: Left;  . IR KYPHO THORACIC WITH BONE BIOPSY  09/15/2019  . IR VERTEBROPLASTY CERV/THOR BX INC UNI/BIL INC/INJECT/IMAGING  11/10/2019  . KYPHOSIS SURGERY  08/2007   cement used  . OVARIAN CYST SURGERY     left  . RADIOLOGY WITH ANESTHESIA N/A 11/10/2019   Procedure: VERTEBROPLASTY;  Surgeon: Luanne Bras, MD;  Location: [facility];  Service: Radiology;  Laterality: N/A;  . TONSILLECTOMY    . TOTAL KNEE ARTHROPLASTY  06/2010   left  . TUBAL LIGATION       OB History    Gravida  3   Para  3   Term      Preterm      AB      Living  3     SAB      TAB      Ectopic      Multiple      Live Births              Family History  Problem Relation Age of Onset  . Breast cancer Mother   . Heart disease Father        MIs  . Colon cancer Other        cousin  . Breast cancer Sister   . Leukemia Brother        GM    Social History   Tobacco Use  . Smoking status: Former Smoker    Packs/day: 1.00    Years: 37.00    Pack years: 37.00    Types: Cigarettes    Start date: 06/26/1949    Quit date: 05/14/1988    Years since quitting: 31.7  . Smokeless tobacco: Never Used  . Tobacco comment: quit 25 years ago  Vaping Use  . Vaping Use: Never used  Substance Use Topics  . Alcohol use: Yes    Alcohol/week: 1.0 standard drink    Types: 1 Glasses of wine per week    Comment: occasional/social  . Drug use: No    Home  Medications Prior to Admission medications   Medication Sig Start Date End Date Taking? Authorizing Provider  acetaminophen (TYLENOL) 500 MG tablet Take 1,000 mg by mouth every 8 (eight) hours as needed for mild pain or moderate pain. NTE 6 tabs/24 hours    [provider]  alendronate (FOSAMAX) 70 MG  tablet Take 1 tablet (70 mg total) by mouth every 7 (seven) days. Take with a full glass of water on an empty stomach. 01/25/20   Colon Branch, MD  aspirin EC 81 MG EC tablet Take 1 tablet (81 mg total) by mouth 2 (two) times daily. Swallow whole. 12/28/19   Georgette Shell, MD  bisacodyl (DULCOLAX) 10 MG suppository Place 1 suppository (10 mg total) rectally daily as needed for moderate constipation. 09/17/19   [name], Corrie Mckusick, MD  bismuth subsalicylate (PEPTO BISMOL) 262 MG/15ML suspension Take 30 mLs by mouth every 4 (four) hours as needed for indigestion (heartburn).     [provider]  docusate sodium (COLACE) 100 MG capsule Take 100 mg by mouth 2 (two) times daily.     [provider]  HYDROcodone-acetaminophen (NORCO/VICODIN) 5-325 MG tablet Take 1 tablet by mouth every 4 (four) hours as needed for moderate pain (pain score 4-6). 02/18/20   Colon Branch, MD  hydroxyurea (HYDREA) 500 MG capsule Take 1 capsule (500 mg total) by mouth every other day. May take with food to minimize GI side effects. 10/05/19   Wille Celeste, PA-C  Infant Care Products [facility]) CREA Apply 1 application topically See admin instructions. Apply to buttocks topically every shift for rash 12/11/19   Colon Branch, MD  lactose free nutrition (BOOST) LIQD Take 237 mLs by mouth See admin instructions. Every shift Patient not taking: Reported on 01/25/2020    [provider]  levETIRAcetam (KEPPRA) 500 MG tablet Take 1 tablet (500 mg total) by mouth 2 (two) times daily. 11/12/19   Rai, Ripudeep K, MD  Lidocaine 4 % PTCH LIDOCAINE 4% PATCH :Apply 1 patch topically daily and remove after 12 hours  to left lateral chest Patient taking differently: Apply 1 patch topically daily. Apply to left lateral chest. Remove after 12 hours. 11/12/19   Rai, Vernelle Emerald, MD  LORazepam (ATIVAN) 0.5 MG tablet Take 1/2 tablet by mouth twice daily, then 1 tablet at bedtime 01/26/20   Colon Branch, MD  metoprolol tartrate (LOPRESSOR) 50 MG tablet Take 1 tablet (50 mg total) by mouth 2 (two) times daily. 10/09/19   Colon Branch, MD  Multiple Vitamin (MULTIVITAMIN WITH MINERALS) TABS tablet Take 1 tablet by mouth daily. 12/29/19   Georgette Shell, MD  omeprazole (PRILOSEC) 20 MG capsule Take 1 capsule (20 mg total) by mouth daily. 10/05/19   Granville Lewis C, PA-C  ondansetron (ZOFRAN) 4 MG tablet Take 1 tablet (4 mg total) by mouth every 6 (six) hours as needed for nausea or vomiting. 10/05/19   [place] Lewis C, PA-C  polyethylene glycol (MIRALAX / GLYCOLAX) 17 g packet Take 17 g by mouth daily.    [provider]  tiZANidine (ZANAFLEX) 2 MG tablet Take 2 mg by mouth every 6 (six) hours as needed for muscle spasms.    [provider]    Allergies    Penicillins, Valsartan, Atorvastatin, Carvedilol, Ezetimibe, Fluvastatin sodium, Magnesium hydroxide, Meloxicam, Pneumovax [pneumococcal polysaccharide vaccine], Quinapril hcl, Simvastatin, Topamax [topiramate], and Vit d-vit e-safflower oil  Review of Systems   Review of Systems  Constitutional: Negative for chills, diaphoresis, fatigue and fever.  HENT: Negative for congestion, rhinorrhea and sneezing.   Eyes: Negative.   Respiratory: Negative for cough, chest tightness and shortness of breath.   Cardiovascular: Negative for chest pain and leg swelling.  Gastrointestinal: Negative for abdominal pain, blood in stool, diarrhea, nausea and vomiting.  Genitourinary: Negative for difficulty  urinating, flank pain, frequency and hematuria.  Musculoskeletal: Negative for arthralgias and back pain.  Skin: Positive for wound. Negative for rash.    Neurological: Positive for headaches (Mild). Negative for dizziness, speech difficulty, weakness and numbness.    Physical Exam Updated Vital Signs BP 115/87   Pulse 72   Temp 98.4 F (36.9 C) (Oral)   Resp 17   SpO2 98%   Physical Exam Constitutional:      Appearance: She is well-developed.  HENT:     Head: Normocephalic.     Comments: Small/less than 1 cm superficial laceration to the posterior scalp, no active bleeding Eyes:     Pupils: Pupils are equal, round, and reactive to light.  Cardiovascular:     Rate and Rhythm: Normal rate and regular rhythm.     Heart sounds: Normal heart sounds.  Pulmonary:     Effort: Pulmonary effort is normal. No respiratory distress.     Breath sounds: Normal breath sounds. No wheezing or rales.  Chest:     Chest wall: No tenderness.  Abdominal:     General: Bowel sounds are normal.     Palpations: Abdomen is soft.     Tenderness: There is no abdominal tenderness. There is no guarding or rebound.  Musculoskeletal:        General: Normal range of motion.     Cervical back: Normal range of motion and neck supple.     Right lower leg: Edema present.     Left lower leg: Edema present.     Comments: No pain on palpation or range of motion of the extremities, 1+ edema to the lower extremities bilaterally  Lymphadenopathy:     Cervical: No cervical adenopathy.  Skin:    General: Skin is warm and dry.     Findings: No rash.  Neurological:     General: No focal deficit present.     Mental Status: She is alert and oriented to person, place, and time.     ED Results / Procedures / Treatments   Labs (all labs ordered are listed, but only abnormal results are displayed) Labs Reviewed - No data to display  EKG None  Radiology CT Head Wo Contrast  Result Date: 02/19/2020 CLINICAL DATA:  Fall, head injury EXAM: CT HEAD WITHOUT CONTRAST CT CERVICAL SPINE WITHOUT CONTRAST TECHNIQUE: Multidetector CT imaging of the head and cervical spine  was performed following the standard protocol without intravenous contrast. Multiplanar CT image reconstructions of the cervical spine were also generated. COMPARISON:  CT head 12/24/2019, 11/05/2019 FINDINGS: CT HEAD FINDINGS Brain: Encephalomalacia within the right frontal lobe is again noted secondary to remote contusion. Encephalomalacia within the temporal lobe seen on prior examination is not as well appreciated on the current exam. Remote infarcts within the right thalamus and left cerebellar hemisphere again noted. Extensive, confluent bilateral subcortical and periventricular white matter changes are again identified, similar to that noted on prior examination, possibly reflecting the sequela of small vessel ischemia. Mild parenchymal volume loss is commensurate with the patient's age. No evidence of acute intracranial hemorrhage or infarct. No abnormal mass effect or midline shift. No abnormal intra or extra-axial mass lesion or fluid collection. The cerebellum is unremarkable. Vascular: No asymmetric hyperdense vasculature at the skull base Skull: Intact Sinuses/Orbits: The visualized paranasal sinuses are clear. The orbits are unremarkable peer Other: Mastoid air cells and middle ear cavities are clear. CT CERVICAL SPINE FINDINGS Alignment: Minimal anterolisthesis of C7 upon T1 is likely degenerative in nature.  Otherwise normal alignment of the cervical spine Skull base and vertebrae: . the craniocervical junction is unremarkable. The atlantodental interval is normal. No acute fracture of the cervical spine. No lytic or blastic bone lesions identified. Soft tissues and spinal canal: No prevertebral fluid or swelling. No visible canal hematoma. Disc levels: Review of the sagittal reformats demonstrates preservation of vertebral body heights. There is mild intervertebral disc space narrowing and endplate remodeling at I3-3 in keeping with changes of mild degenerative disc disease. Remaining intervertebral  disc heights are preserved. Degenerative changes are noted with the atlantodental articulation. Review of the axial images demonstrates mild to moderate facet arthrosis on the left at C2-3, C3-4, C4-5 resulting in mild to moderate neural foraminal narrowing, most severe on the left at C3-4. The spinal canal is widely patent. Upper chest: The visualized lung apices are clear bilaterally. Other: None significant IMPRESSION: No acute intracranial injury.  No calvarial fracture. No acute fracture of the cervical spine. Electronically Signed   By: Fidela Salisbury MD   On: 02/19/2020 20:03   CT Cervical Spine Wo Contrast  Result Date: 02/19/2020 CLINICAL DATA:  Fall, head injury EXAM: CT HEAD WITHOUT CONTRAST CT CERVICAL SPINE WITHOUT CONTRAST TECHNIQUE: Multidetector CT imaging of the head and cervical spine was performed following the standard protocol without intravenous contrast. Multiplanar CT image reconstructions of the cervical spine were also generated. COMPARISON:  CT head 12/24/2019, 11/05/2019 FINDINGS: CT HEAD FINDINGS Brain: Encephalomalacia within the right frontal lobe is again noted secondary to remote contusion. Encephalomalacia within the temporal lobe seen on prior examination is not as well appreciated on the current exam. Remote infarcts within the right thalamus and left cerebellar hemisphere again noted. Extensive, confluent bilateral subcortical and periventricular white matter changes are again identified, similar to that noted on prior examination, possibly reflecting the sequela of small vessel ischemia. Mild parenchymal volume loss is commensurate with the patient's age. No evidence of acute intracranial hemorrhage or infarct. No abnormal mass effect or midline shift. No abnormal intra or extra-axial mass lesion or fluid collection. The cerebellum is unremarkable. Vascular: No asymmetric hyperdense vasculature at the skull base Skull: Intact Sinuses/Orbits: The visualized paranasal sinuses  are clear. The orbits are unremarkable peer Other: Mastoid air cells and middle ear cavities are clear. CT CERVICAL SPINE FINDINGS Alignment: Minimal anterolisthesis of C7 upon T1 is likely degenerative in nature. Otherwise normal alignment of the cervical spine Skull base and vertebrae: . the craniocervical junction is unremarkable. The atlantodental interval is normal. No acute fracture of the cervical spine. No lytic or blastic bone lesions identified. Soft tissues and spinal canal: No prevertebral fluid or swelling. No visible canal hematoma. Disc levels: Review of the sagittal reformats demonstrates preservation of vertebral body heights. There is mild intervertebral disc space narrowing and endplate remodeling at A2-5 in keeping with changes of mild degenerative disc disease. Remaining intervertebral disc heights are preserved. Degenerative changes are noted with the atlantodental articulation. Review of the axial images demonstrates mild to moderate facet arthrosis on the left at C2-3, C3-4, C4-5 resulting in mild to moderate neural foraminal narrowing, most severe on the left at C3-4. The spinal canal is widely patent. Upper chest: The visualized lung apices are clear bilaterally. Other: None significant IMPRESSION: No acute intracranial injury.  No calvarial fracture. No acute fracture of the cervical spine. Electronically Signed   By: Fidela Salisbury MD   On: 02/19/2020 20:03    Procedures Procedures (including critical care time)  Medications Ordered in ED  Medications  polymixin-bacitracin (POLYSPORIN) ointment (has no administration in time range)    ED Course  I have reviewed the triage vital signs and the nursing notes.  Pertinent labs & imaging results that were available during my care of the patient were reviewed by me and considered in my medical decision making (see chart for details).    MDM Rules/Calculators/A&P                          Patient is a 86 year old female who  presents after mechanical fall.  She has a very small laceration to the posterior scalp.  It is not bleeding and does not appear to need suturing.  Antibiotic ointment was placed on the wound.  She had a CT scan of her head and cervical spine which showed no acute abnormalities.  She states her tetanus shot is up-to-date.  She does not have any other apparent injuries.  She was discharged back to [facility].  Return precautions were given.  Final Clinical Impression(s) / ED Diagnoses Final diagnoses:  Fall, initial encounter  Injury of head, initial encounter  Laceration of scalp, initial encounter    Rx / DC Orders ED Discharge Orders    None       Malvin Johns, MD 02/19/20 2057

## 2020-02-22 ENCOUNTER — Other Ambulatory Visit: Payer: Self-pay

## 2020-02-22 DIAGNOSIS — R1314 Dysphagia, pharyngoesophageal phase: Secondary | ICD-10-CM | POA: Diagnosis not present

## 2020-02-22 DIAGNOSIS — R262 Difficulty in walking, not elsewhere classified: Secondary | ICD-10-CM | POA: Diagnosis not present

## 2020-02-22 DIAGNOSIS — M6281 Muscle weakness (generalized): Secondary | ICD-10-CM | POA: Diagnosis not present

## 2020-02-22 DIAGNOSIS — R1312 Dysphagia, oropharyngeal phase: Secondary | ICD-10-CM | POA: Diagnosis not present

## 2020-02-22 DIAGNOSIS — R41841 Cognitive communication deficit: Secondary | ICD-10-CM | POA: Diagnosis not present

## 2020-02-22 DIAGNOSIS — R2681 Unsteadiness on feet: Secondary | ICD-10-CM | POA: Diagnosis not present

## 2020-02-23 DIAGNOSIS — R1312 Dysphagia, oropharyngeal phase: Secondary | ICD-10-CM | POA: Diagnosis not present

## 2020-02-23 DIAGNOSIS — R2681 Unsteadiness on feet: Secondary | ICD-10-CM | POA: Diagnosis not present

## 2020-02-23 DIAGNOSIS — M6281 Muscle weakness (generalized): Secondary | ICD-10-CM | POA: Diagnosis not present

## 2020-02-23 DIAGNOSIS — R1314 Dysphagia, pharyngoesophageal phase: Secondary | ICD-10-CM | POA: Diagnosis not present

## 2020-02-23 DIAGNOSIS — R262 Difficulty in walking, not elsewhere classified: Secondary | ICD-10-CM | POA: Diagnosis not present

## 2020-02-23 DIAGNOSIS — R41841 Cognitive communication deficit: Secondary | ICD-10-CM | POA: Diagnosis not present

## 2020-02-24 DIAGNOSIS — R41841 Cognitive communication deficit: Secondary | ICD-10-CM | POA: Diagnosis not present

## 2020-02-24 DIAGNOSIS — R1314 Dysphagia, pharyngoesophageal phase: Secondary | ICD-10-CM | POA: Diagnosis not present

## 2020-02-24 DIAGNOSIS — R1312 Dysphagia, oropharyngeal phase: Secondary | ICD-10-CM | POA: Diagnosis not present

## 2020-02-24 DIAGNOSIS — R262 Difficulty in walking, not elsewhere classified: Secondary | ICD-10-CM | POA: Diagnosis not present

## 2020-02-24 DIAGNOSIS — R2681 Unsteadiness on feet: Secondary | ICD-10-CM | POA: Diagnosis not present

## 2020-02-24 DIAGNOSIS — M6281 Muscle weakness (generalized): Secondary | ICD-10-CM | POA: Diagnosis not present

## 2020-02-25 DIAGNOSIS — M6281 Muscle weakness (generalized): Secondary | ICD-10-CM | POA: Diagnosis not present

## 2020-02-25 DIAGNOSIS — R1314 Dysphagia, pharyngoesophageal phase: Secondary | ICD-10-CM | POA: Diagnosis not present

## 2020-02-25 DIAGNOSIS — R1312 Dysphagia, oropharyngeal phase: Secondary | ICD-10-CM | POA: Diagnosis not present

## 2020-02-25 DIAGNOSIS — R262 Difficulty in walking, not elsewhere classified: Secondary | ICD-10-CM | POA: Diagnosis not present

## 2020-02-25 DIAGNOSIS — R41841 Cognitive communication deficit: Secondary | ICD-10-CM | POA: Diagnosis not present

## 2020-02-25 DIAGNOSIS — R2681 Unsteadiness on feet: Secondary | ICD-10-CM | POA: Diagnosis not present

## 2020-02-26 DIAGNOSIS — R41841 Cognitive communication deficit: Secondary | ICD-10-CM | POA: Diagnosis not present

## 2020-02-26 DIAGNOSIS — R1314 Dysphagia, pharyngoesophageal phase: Secondary | ICD-10-CM | POA: Diagnosis not present

## 2020-02-26 DIAGNOSIS — R262 Difficulty in walking, not elsewhere classified: Secondary | ICD-10-CM | POA: Diagnosis not present

## 2020-02-26 DIAGNOSIS — R1312 Dysphagia, oropharyngeal phase: Secondary | ICD-10-CM | POA: Diagnosis not present

## 2020-02-26 DIAGNOSIS — M6281 Muscle weakness (generalized): Secondary | ICD-10-CM | POA: Diagnosis not present

## 2020-02-26 DIAGNOSIS — R2681 Unsteadiness on feet: Secondary | ICD-10-CM | POA: Diagnosis not present

## 2020-02-29 DIAGNOSIS — R1314 Dysphagia, pharyngoesophageal phase: Secondary | ICD-10-CM | POA: Diagnosis not present

## 2020-02-29 DIAGNOSIS — R262 Difficulty in walking, not elsewhere classified: Secondary | ICD-10-CM | POA: Diagnosis not present

## 2020-02-29 DIAGNOSIS — R1312 Dysphagia, oropharyngeal phase: Secondary | ICD-10-CM | POA: Diagnosis not present

## 2020-02-29 DIAGNOSIS — R2681 Unsteadiness on feet: Secondary | ICD-10-CM | POA: Diagnosis not present

## 2020-02-29 DIAGNOSIS — M6281 Muscle weakness (generalized): Secondary | ICD-10-CM | POA: Diagnosis not present

## 2020-02-29 DIAGNOSIS — R41841 Cognitive communication deficit: Secondary | ICD-10-CM | POA: Diagnosis not present

## 2020-03-01 DIAGNOSIS — M6281 Muscle weakness (generalized): Secondary | ICD-10-CM | POA: Diagnosis not present

## 2020-03-01 DIAGNOSIS — R41841 Cognitive communication deficit: Secondary | ICD-10-CM | POA: Diagnosis not present

## 2020-03-01 DIAGNOSIS — R2681 Unsteadiness on feet: Secondary | ICD-10-CM | POA: Diagnosis not present

## 2020-03-01 DIAGNOSIS — R1312 Dysphagia, oropharyngeal phase: Secondary | ICD-10-CM | POA: Diagnosis not present

## 2020-03-01 DIAGNOSIS — R1314 Dysphagia, pharyngoesophageal phase: Secondary | ICD-10-CM | POA: Diagnosis not present

## 2020-03-01 DIAGNOSIS — R262 Difficulty in walking, not elsewhere classified: Secondary | ICD-10-CM | POA: Diagnosis not present

## 2020-03-02 DIAGNOSIS — M6281 Muscle weakness (generalized): Secondary | ICD-10-CM | POA: Diagnosis not present

## 2020-03-02 DIAGNOSIS — R1312 Dysphagia, oropharyngeal phase: Secondary | ICD-10-CM | POA: Diagnosis not present

## 2020-03-02 DIAGNOSIS — R262 Difficulty in walking, not elsewhere classified: Secondary | ICD-10-CM | POA: Diagnosis not present

## 2020-03-02 DIAGNOSIS — R41841 Cognitive communication deficit: Secondary | ICD-10-CM | POA: Diagnosis not present

## 2020-03-02 DIAGNOSIS — R1314 Dysphagia, pharyngoesophageal phase: Secondary | ICD-10-CM | POA: Diagnosis not present

## 2020-03-02 DIAGNOSIS — R2681 Unsteadiness on feet: Secondary | ICD-10-CM | POA: Diagnosis not present

## 2020-03-03 DIAGNOSIS — R41841 Cognitive communication deficit: Secondary | ICD-10-CM | POA: Diagnosis not present

## 2020-03-03 DIAGNOSIS — R262 Difficulty in walking, not elsewhere classified: Secondary | ICD-10-CM | POA: Diagnosis not present

## 2020-03-03 DIAGNOSIS — R1312 Dysphagia, oropharyngeal phase: Secondary | ICD-10-CM | POA: Diagnosis not present

## 2020-03-03 DIAGNOSIS — M6281 Muscle weakness (generalized): Secondary | ICD-10-CM | POA: Diagnosis not present

## 2020-03-03 DIAGNOSIS — R1314 Dysphagia, pharyngoesophageal phase: Secondary | ICD-10-CM | POA: Diagnosis not present

## 2020-03-03 DIAGNOSIS — R2681 Unsteadiness on feet: Secondary | ICD-10-CM | POA: Diagnosis not present

## 2020-03-04 ENCOUNTER — Encounter (HOSPITAL_COMMUNITY): Payer: Self-pay | Admitting: Emergency Medicine

## 2020-03-04 ENCOUNTER — Emergency Department (HOSPITAL_COMMUNITY): Payer: Medicare Other

## 2020-03-04 ENCOUNTER — Emergency Department (HOSPITAL_COMMUNITY)
Admission: EM | Admit: 2020-03-04 | Discharge: 2020-03-04 | Disposition: A | Discharge status: Home / Self Care | Payer: Medicare Other | Attending: Emergency Medicine | Admitting: Emergency Medicine

## 2020-03-04 DIAGNOSIS — R0602 Shortness of breath: Secondary | ICD-10-CM | POA: Diagnosis not present

## 2020-03-04 DIAGNOSIS — I5032 Chronic diastolic (congestive) heart failure: Secondary | ICD-10-CM | POA: Diagnosis not present

## 2020-03-04 DIAGNOSIS — Z853 Personal history of malignant neoplasm of breast: Secondary | ICD-10-CM | POA: Insufficient documentation

## 2020-03-04 DIAGNOSIS — I110 Hypertensive heart disease with heart failure: Secondary | ICD-10-CM | POA: Diagnosis not present

## 2020-03-04 DIAGNOSIS — R079 Chest pain, unspecified: Secondary | ICD-10-CM

## 2020-03-04 DIAGNOSIS — I2510 Atherosclerotic heart disease of native coronary artery without angina pectoris: Secondary | ICD-10-CM | POA: Insufficient documentation

## 2020-03-04 DIAGNOSIS — I10 Essential (primary) hypertension: Secondary | ICD-10-CM | POA: Diagnosis not present

## 2020-03-04 DIAGNOSIS — M546 Pain in thoracic spine: Secondary | ICD-10-CM | POA: Diagnosis not present

## 2020-03-04 DIAGNOSIS — K30 Functional dyspepsia: Secondary | ICD-10-CM | POA: Insufficient documentation

## 2020-03-04 DIAGNOSIS — Z96652 Presence of left artificial knee joint: Secondary | ICD-10-CM | POA: Diagnosis not present

## 2020-03-04 DIAGNOSIS — Z7982 Long term (current) use of aspirin: Secondary | ICD-10-CM | POA: Insufficient documentation

## 2020-03-04 DIAGNOSIS — R0789 Other chest pain: Secondary | ICD-10-CM | POA: Diagnosis not present

## 2020-03-04 DIAGNOSIS — R600 Localized edema: Secondary | ICD-10-CM | POA: Diagnosis not present

## 2020-03-04 DIAGNOSIS — Z79899 Other long term (current) drug therapy: Secondary | ICD-10-CM | POA: Diagnosis not present

## 2020-03-04 DIAGNOSIS — Z87891 Personal history of nicotine dependence: Secondary | ICD-10-CM | POA: Insufficient documentation

## 2020-03-04 DIAGNOSIS — K449 Diaphragmatic hernia without obstruction or gangrene: Secondary | ICD-10-CM | POA: Diagnosis not present

## 2020-03-04 LAB — TROPONIN I (HIGH SENSITIVITY)
Troponin I (High Sensitivity): 9 ng/L (ref <18)
Troponin I (High Sensitivity): 27 ng/L — ABNORMAL HIGH (ref <18)

## 2020-03-04 LAB — BASIC METABOLIC PANEL
Anion gap: 8 (ref 5–15)
BUN: <5 mg/dL — ABNORMAL LOW (ref 8–23)
CO2: 30 mmol/L (ref 22–32)
Calcium: 9.5 mg/dL (ref 8.9–10.3)
Chloride: 101 mmol/L (ref 98–111)
Creatinine, Ser: 0.43 mg/dL — ABNORMAL LOW (ref 0.44–1.00)
GFR, Estimated: >60 mL/min (ref >60)
Glucose, Bld: 103 mg/dL — ABNORMAL HIGH (ref 70–99)
Potassium: 4.1 mmol/L (ref 3.5–5.1)
Sodium: 139 mmol/L (ref 135–145)

## 2020-03-04 LAB — CBC
HCT: 45 % (ref 36.0–46.0)
Hemoglobin: 14.6 g/dL (ref 12.0–15.0)
MCH: 35.4 pg — ABNORMAL HIGH (ref 26.0–34.0)
MCHC: 32.4 g/dL (ref 30.0–36.0)
MCV: 109.2 fL — ABNORMAL HIGH (ref 80.0–100.0)
Platelets: 415 10*3/uL — ABNORMAL HIGH (ref 150–400)
RBC: 4.12 MIL/uL (ref 3.87–5.11)
RDW: 13.7 % (ref 11.5–15.5)
WBC: 3.7 10*3/uL — ABNORMAL LOW (ref 4.0–10.5)
nRBC: 0 % (ref 0.0–0.2)

## 2020-03-04 MED ORDER — LORAZEPAM 1 MG PO TABS
0.5000 mg | ORAL_TABLET | Freq: Once | ORAL | Status: AC
  Administered 2020-03-04: 0.5 mg via ORAL
  Filled 2020-03-04: qty 1

## 2020-03-04 MED ORDER — CALCIUM CARBONATE ANTACID 500 MG PO CHEW
1.0000 | CHEWABLE_TABLET | Freq: Once | ORAL | Status: AC
  Administered 2020-03-04: 200 mg via ORAL
  Filled 2020-03-04: qty 1

## 2020-03-04 MED ORDER — FUROSEMIDE 20 MG PO TABS: 20.0000 mg | ORAL_TABLET | Freq: Every day | ORAL | 0 refills | Status: DC

## 2020-03-04 NOTE — ED Notes (Signed)
Patient verbalizes understanding of discharge instructions. Opportunity for questioning and answers were provided. Armband removed by staff, pt discharged from ED via wheelchair.  

## 2020-03-04 NOTE — ED Notes (Signed)
Removed from ptar list

## 2020-03-04 NOTE — ED Notes (Signed)
7th on ptar list

## 2020-03-04 NOTE — ED Provider Notes (Signed)
[facility] EMERGENCY DEPARTMENT Provider Note   CSN: [id number] Arrival date & time: 03/04/20  9449     History No chief complaint on file.   [name] is a 86 y.o. female with past medical history below who presents to the ED for chief complaint of chest pain that started this morning at 8 AM.  Patient states that she was getting up to use the bathroom with a walker the chest pain occurred.  Located in the mid chest, radiates to her upper back, characterized pain as pressure, nothing makes it better or worse, pain is resolved after 36-40 minutes.  There is some shortness of breath associated with the pain.  Patient does have chronic back pain but his back pain is new and more severe.  Patient is currently pain-free.  Patient states that she has been falling frequently and her most frequent fall was on 02/19/2020.  She was evaluated in the ED and CT head /CT cervical spine were unremarkable.  Patient states that she had a heart cath done years ago which did not show any obstructions of her coronary arteries.  Patient also complains of indigestion for the last few days.  HPI     Past Medical History:  Diagnosis Date  . CAD (coronary artery disease)    mild nonobstructive disease on cath in 2003  . Cancer ([place])   . Cardiomyopathy    Probable Takotsubo, severe CP w/ normal cath in 1994. Severe CP in 2003 w/ widespread T wave inversions on ECG. Cath w/ minimal coronary disease but LV-gram showed periapical severe hypokinesis and basilar hyperkinesia (EF 40%). Last echo in 4/09 confirmed full LV functional recovery with EF 60%, no regional wall motion abnormalities, mild to moderate LVH.  Marland Kitchen Chronic diastolic CHF (congestive heart failure) ([place]) 09/13/2019  . CVA (cerebral infarction)   . Depression with anxiety 03-29-11   lost husband 3'09  . Diverticulitis   . DVT (deep venous thrombosis) ([place])    after venous ablation, R leg  . Dyspnea   . E. coli  gastroenteritis 03-29-11   8'10  . Fracture 09/10/07   L2, status post vertebroplasty of L2 performed by IR  . GERD (gastroesophageal reflux disease)   . Headache(784.0)    migraines  . Hemorrhoids   . Hiatal hernia 03-29-11   no nerve problems  . Hyperlipidemia   . Hyperplastic colon polyp 06/2001  . Hypertension   . Iron deficiency anemia due to chronic blood loss 12/30/2017  . OA (osteoarthritis) of knee 03-29-11   w/ bilateral knee pain-not a problem now  . OSA (obstructive sleep apnea) 03-29-11   no cpap used, not a problem now.  . Osteoporosis   . Otalgia of both ears    Dr. Simeon Craft  . Polycythemia   . Stroke [facility]) 03-29-11   CVA x2 -last 10'12-?TIA(visual problems)  . Varicose vein     Patient Active Problem List   Diagnosis Date Noted  . Acute midline thoracic back pain 03/04/2020  . Malnutrition of moderate degree 12/27/2019  . Pressure injury of skin 12/27/2019  . S/p left hip fracture   . Comminuted fracture of hip, left, open type I or II, initial encounter ([place]) 12/24/2019  . Closed left hip fracture ([place]) 12/24/2019  . ICH (intracerebral hemorrhage) ([place]) 11/04/2019  . Intractable back pain 09/13/2019  . Compression fracture of T9 vertebra ([place]) 09/13/2019  . Chronic diastolic CHF (congestive heart failure) ([place]) 09/13/2019  . Overweight (BMI 25.0-29.9) 09/13/2019  .  Constipation 09/13/2019  . Raynaud's phenomenon without gangrene 05/19/2018  . Iron deficiency anemia due to chronic blood loss 12/30/2017  . Insomnia 06/25/2017  . Mild CAD 05/24/2017  . PCP NOTES >>> 02/15/2015  . Superficial thrombophlebitis 04/06/2014  . Annual physical exam 08/18/2012  . Edema 04/14/2012  . Breast cancer ([place]) 11/05/2011  . Polycythemia 11/05/2011  . Chest pain 01/16/2011  . TIA (transient ischemic attack) 01/16/2011  . CARDIOMYOPATHY 08/03/2009  . DJD -- pain mgmt 08/16/2008  . ABDOMINAL PAIN, LEFT LOWER QUADRANT 07/05/2008  . GAIT DISTURBANCE 10/21/2007  . ANXIETY  DEPRESSION 10/08/2007  . Hyperlipidemia 06/25/2007  . Takotsubo cardiomyopathy 06/25/2007  . GERD without esophagitis 06/25/2007  . Essential hypertension 09/30/2006  . Senile osteoporosis 09/30/2006  . Migraine with aura 09/30/2006    Past Surgical History:  Procedure Laterality Date  . APPENDECTOMY    . BACK SURGERY    . BREAST BIOPSY    . BREAST CYST EXCISION    . BREAST LUMPECTOMY Left 2013   left stage I left breast cancer  . CHOLECYSTECTOMY  04/09/2011   Procedure: LAPAROSCOPIC CHOLECYSTECTOMY WITH INTRAOPERATIVE CHOLANGIOGRAM;  Surgeon: Odis Hollingshead, MD;  Location: [facility] ORS;  Service: General;  Laterality: N/A;  . Dental Extraction     L maxillary molar  . INTRAMEDULLARY (IM) NAIL INTERTROCHANTERIC Left 12/25/2019   Procedure: INTRAMEDULLARY (IM) NAIL INTERTROCHANTRIC;  Surgeon: Paralee Cancel, MD;  Location: [facility];  Service: Orthopedics;  Laterality: Left;  . IR KYPHO THORACIC WITH BONE BIOPSY  09/15/2019  . IR VERTEBROPLASTY CERV/THOR BX INC UNI/BIL INC/INJECT/IMAGING  11/10/2019  . KYPHOSIS SURGERY  08/2007   cement used  . OVARIAN CYST SURGERY     left  . RADIOLOGY WITH ANESTHESIA N/A 11/10/2019   Procedure: VERTEBROPLASTY;  Surgeon: Luanne Bras, MD;  Location: [place];  Service: Radiology;  Laterality: N/A;  . TONSILLECTOMY    . TOTAL KNEE ARTHROPLASTY  06/2010   left  . TUBAL LIGATION       OB History    Gravida  3   Para  3   Term      Preterm      AB      Living  3     SAB      TAB      Ectopic      Multiple      Live Births              Family History  Problem Relation Age of Onset  . Breast cancer Mother   . Heart disease Father        MIs  . Colon cancer Other        cousin  . Breast cancer Sister   . Leukemia Brother        GM    Social History   Tobacco Use  . Smoking status: Former Smoker    Packs/day: 1.00    Years: 37.00    Pack years: 37.00    Types: Cigarettes    Start date: 06/26/1949    Quit date:  05/14/1988    Years since quitting: 31.8  . Smokeless tobacco: Never Used  . Tobacco comment: quit 25 years ago  Vaping Use  . Vaping Use: Never used  Substance Use Topics  . Alcohol use: Yes    Alcohol/week: 1.0 standard drink    Types: 1 Glasses of wine per week    Comment: occasional/social  . Drug use: No    Home Medications  Prior to Admission medications   Medication Sig Start Date End Date Taking? Authorizing Provider  acetaminophen (TYLENOL) 500 MG tablet Take 1,000 mg by mouth every 8 (eight) hours as needed for mild pain or moderate pain. NTE 6 tabs/24 hours    [provider]  alendronate (FOSAMAX) 70 MG tablet Take 1 tablet (70 mg total) by mouth every 7 (seven) days. Take with a full glass of water on an empty stomach. 01/25/20   Colon Branch, MD  aspirin 81 MG EC tablet Take 1 tablet (81 mg total) by mouth daily. Swallow whole. 02/22/20   Colon Branch, MD  bisacodyl (DULCOLAX) 10 MG suppository Place 1 suppository (10 mg total) rectally daily as needed for moderate constipation. 09/17/19   [name], Corrie Mckusick, MD  bismuth subsalicylate (PEPTO BISMOL) 262 MG/15ML suspension Take 30 mLs by mouth every 4 (four) hours as needed for indigestion (heartburn).     [provider]  docusate sodium (COLACE) 100 MG capsule Take 100 mg by mouth 2 (two) times daily.     [provider]  HYDROcodone-acetaminophen (NORCO/VICODIN) 5-325 MG tablet Take 1 tablet by mouth every 4 (four) hours as needed for moderate pain (pain score 4-6). 02/18/20   Colon Branch, MD  hydroxyurea (HYDREA) 500 MG capsule Take 1 capsule (500 mg total) by mouth every other day. May take with food to minimize GI side effects. 10/05/19   Wille Celeste, PA-C  Infant Care Products [facility]) CREA Apply 1 application topically See admin instructions. Apply to buttocks topically every shift for rash 12/11/19   Colon Branch, MD  lactose free nutrition (BOOST) LIQD Take 237 mLs by mouth See admin instructions.  Every shift Patient not taking: Reported on 01/25/2020    [provider]  levETIRAcetam (KEPPRA) 500 MG tablet Take 1 tablet (500 mg total) by mouth 2 (two) times daily. 11/12/19   Rai, Ripudeep K, MD  Lidocaine 4 % PTCH LIDOCAINE 4% PATCH :Apply 1 patch topically daily and remove after 12 hours to left lateral chest Patient taking differently: Apply 1 patch topically daily. Apply to left lateral chest. Remove after 12 hours. 11/12/19   Rai, Vernelle Emerald, MD  LORazepam (ATIVAN) 0.5 MG tablet Take 1/2 tablet by mouth twice daily, then 1 tablet at bedtime 01/26/20   Colon Branch, MD  metoprolol tartrate (LOPRESSOR) 50 MG tablet Take 1 tablet (50 mg total) by mouth 2 (two) times daily. 10/09/19   Colon Branch, MD  Multiple Vitamin (MULTIVITAMIN WITH MINERALS) TABS tablet Take 1 tablet by mouth daily. 12/29/19   Georgette Shell, MD  omeprazole (PRILOSEC) 20 MG capsule Take 1 capsule (20 mg total) by mouth daily. 10/05/19   Granville Lewis C, PA-C  ondansetron (ZOFRAN) 4 MG tablet Take 1 tablet (4 mg total) by mouth every 6 (six) hours as needed for nausea or vomiting. 10/05/19   [place]ville Lewis C, PA-C  polyethylene glycol (MIRALAX / GLYCOLAX) 17 g packet Take 17 g by mouth daily.    [provider]  tiZANidine (ZANAFLEX) 2 MG tablet Take 2 mg by mouth every 6 (six) hours as needed for muscle spasms.    [provider]    Allergies    Penicillins, Valsartan, Atorvastatin, Carvedilol, Ezetimibe, Fluvastatin sodium, Magnesium hydroxide, Meloxicam, Pneumovax [pneumococcal polysaccharide vaccine], Quinapril hcl, Simvastatin, Topamax [topiramate], and Vit d-vit e-safflower oil  Review of Systems   Review of Systems  Respiratory: Positive for shortness of breath.   Cardiovascular: Positive for  chest pain.  Gastrointestinal: Negative for abdominal pain, nausea and vomiting.  Musculoskeletal: Positive for back pain and gait problem.    Physical Exam Updated Vital Signs BP (!) 157/86    Pulse 91   Temp 98.2 F (36.8 C) (Oral)   Resp 14   Ht 5\' 6"  (1.676 m)   Wt 55.3 kg   SpO2 97%   BMI 19.69 kg/m   Physical Exam Constitutional:      General: She is not in acute distress.    Appearance: She is not toxic-appearing.  HENT:     Head: Normocephalic.  Eyes:     General:        Right eye: No discharge.        Left eye: No discharge.  Cardiovascular:     Rate and Rhythm: Normal rate and regular rhythm.  Pulmonary:     Effort: No respiratory distress.     Breath sounds: Normal breath sounds. No wheezing.  Abdominal:     General: Bowel sounds are normal. There is no distension.     Palpations: Abdomen is soft.     Tenderness: There is no abdominal tenderness. There is no right CVA tenderness or left CVA tenderness.  Musculoskeletal:        General: Tenderness present.     Cervical back: Normal range of motion.     Right lower leg: Edema present.     Left lower leg: Edema present.     Comments: +1 lower extremity edema bilaterally Focal tenderness to palpation at T6-T8  Skin:    General: Skin is warm.  Neurological:     Mental Status: She is alert.  Psychiatric:        Mood and Affect: Mood normal.     ED Results / Procedures / Treatments   Labs (all labs ordered are listed, but only abnormal results are displayed) Labs Reviewed  BASIC METABOLIC PANEL - Abnormal; Notable for the following components:      Result Value   Glucose, Bld 103 (*)    BUN <5 (*)    Creatinine, Ser 0.43 (*)    All other components within normal limits  CBC - Abnormal; Notable for the following components:   WBC 3.7 (*)    MCV 109.2 (*)    MCH 35.4 (*)    Platelets 415 (*)    All other components within normal limits  TROPONIN I (HIGH SENSITIVITY) - Abnormal; Notable for the following components:   Troponin I (High Sensitivity) 27 (*)    All other components within normal limits  TROPONIN I (HIGH SENSITIVITY)    EKG EKG Interpretation  Date/Time:  Friday March 04 2020 16:14:21 EDT Ventricular Rate:  75 PR Interval:  166 QRS Duration: 76 QT Interval:  359 QTC Calculation: 401 R Axis:   33 Text Interpretation: Sinus rhythm Anteroseptal infarct, old No significant change since last tracing Confirmed by Blanchie Dessert [phone]) on 03/04/2020 4:16:15 PM   Radiology DG Chest 2 View  Result Date: 03/04/2020 CLINICAL DATA:  Chest pain, shortness of breath EXAM: CHEST - 2 VIEW COMPARISON:  11/05/2019, 11/11/2019 FINDINGS: Heart size is normal. Hiatal hernia. No focal airspace consolidation, pleural effusion, or pneumothorax. Suspect nondisplaced lateral right sixth rib fracture. Numerous lateral left-sided rib fractures. Numerous thoracic vertebral body compression fractures with cement augmentation at T4 and T9. New severe compression fracture of T8. IMPRESSION: 1. No acute cardiopulmonary findings. 2. New severe compression fracture of T8. 3. Suspect nondisplaced lateral right sixth  rib fracture, age indeterminate. 4. Numerous lateral left-sided rib fractures. These were present on the previous study 11/11/2019. Electronically Signed   By: Davina Poke D.O.   On: 03/04/2020 10:16    Procedures Procedures (including critical care time)  Medications Ordered in ED Medications - No data to display  ED Course  I have reviewed the triage vital signs and the nursing notes.  Pertinent labs & imaging results that were available during my care of the patient were reviewed by me and considered in my medical decision making (see chart for details).     MDM Rules/Calculators/A&P                          Patient presents to the ED for chief complaint of chest pain.  Pain is brought on with exertion, associated shortness of breath, radiates to the back, resolved within 30-40 minutes.  Patient is currently pain-free and in no acute distress.  Her first troponin was 9 and second troponin 24.  Repeat EKG shows normal sinus rhythm and at baseline.  Given her story  and risk factor, suspect acute coronary disease.  With her lower extremities edema that started in 2 weeks, suspect new onset of CHF.  Echocardiogram in January 2020 is normal.  Low suspicion for PE or aortic dissection given her stable vital signs.  Her back pain is likely due to compression fracture at T8 from her last fall.  Her heart score is 4 which suggest admission.  However, patient denies and insist on going home.  Long discussion with patient regarding the importance of admission to rule out CAD or CHF that causing her chest pain.  Patient verbalized understanding of the risk and decided to go home.  She has seen a cardiologist in the past but stopped going.  Patient states that she will call her doctor on Monday and set up a follow-up appointment.  She will also get a referral to a cardiologist from her primary care physician.  Come back to the ED for worsening chest pain or shortness of breath.  Patient agrees with plan   Final Clinical Impression(s) / ED Diagnoses Final diagnoses:  Chest pain, unspecified type  Acute midline thoracic back pain    Rx / DC Orders ED Discharge Orders    None       Gaylan Gerold, DO 03/04/20 1654    Blanchie Dessert, MD 03/04/20 2313

## 2020-03-04 NOTE — ED Triage Notes (Signed)
Pt here from [facility]  With c/o chest pain  And sob , no n/v , pt received  324 asas and 1 nitro

## 2020-03-04 NOTE — Discharge Instructions (Addendum)
Ms. Cowens, you came in the ED for chest pain.  I suspect that your chest pain is caused by myocardial ischemia or acute heart failure.  Your back pain is likely due to a vertebral fracture at T8 from your last fall.  My recommendation is for you to be admitted to rule out heart attack.  You decided to go home after understanding all the risks.  Please follow-up with your primary physician as soon as possible and obtain a referral to a new cardiologist.  Come back to the ED for worsening chest pain or shortness of breath.  Take care

## 2020-03-06 DIAGNOSIS — M6281 Muscle weakness (generalized): Secondary | ICD-10-CM | POA: Diagnosis not present

## 2020-03-06 DIAGNOSIS — I7300 Raynaud's syndrome without gangrene: Secondary | ICD-10-CM | POA: Diagnosis not present

## 2020-03-06 DIAGNOSIS — K219 Gastro-esophageal reflux disease without esophagitis: Secondary | ICD-10-CM | POA: Diagnosis not present

## 2020-03-06 DIAGNOSIS — I110 Hypertensive heart disease with heart failure: Secondary | ICD-10-CM | POA: Diagnosis not present

## 2020-03-06 DIAGNOSIS — R262 Difficulty in walking, not elsewhere classified: Secondary | ICD-10-CM | POA: Diagnosis not present

## 2020-03-06 DIAGNOSIS — R41841 Cognitive communication deficit: Secondary | ICD-10-CM | POA: Diagnosis not present

## 2020-03-06 DIAGNOSIS — Z853 Personal history of malignant neoplasm of breast: Secondary | ICD-10-CM | POA: Diagnosis not present

## 2020-03-06 DIAGNOSIS — F419 Anxiety disorder, unspecified: Secondary | ICD-10-CM | POA: Diagnosis not present

## 2020-03-06 DIAGNOSIS — R1314 Dysphagia, pharyngoesophageal phase: Secondary | ICD-10-CM | POA: Diagnosis not present

## 2020-03-06 DIAGNOSIS — F32A Depression, unspecified: Secondary | ICD-10-CM | POA: Diagnosis not present

## 2020-03-06 DIAGNOSIS — Z7982 Long term (current) use of aspirin: Secondary | ICD-10-CM | POA: Diagnosis not present

## 2020-03-06 DIAGNOSIS — I509 Heart failure, unspecified: Secondary | ICD-10-CM | POA: Diagnosis not present

## 2020-03-06 DIAGNOSIS — K5900 Constipation, unspecified: Secondary | ICD-10-CM | POA: Diagnosis not present

## 2020-03-06 DIAGNOSIS — G43909 Migraine, unspecified, not intractable, without status migrainosus: Secondary | ICD-10-CM | POA: Diagnosis not present

## 2020-03-06 DIAGNOSIS — G40909 Epilepsy, unspecified, not intractable, without status epilepticus: Secondary | ICD-10-CM | POA: Diagnosis not present

## 2020-03-06 DIAGNOSIS — M810 Age-related osteoporosis without current pathological fracture: Secondary | ICD-10-CM | POA: Diagnosis not present

## 2020-03-06 DIAGNOSIS — R1312 Dysphagia, oropharyngeal phase: Secondary | ICD-10-CM | POA: Diagnosis not present

## 2020-03-06 DIAGNOSIS — R2681 Unsteadiness on feet: Secondary | ICD-10-CM | POA: Diagnosis not present

## 2020-03-06 DIAGNOSIS — E440 Moderate protein-calorie malnutrition: Secondary | ICD-10-CM | POA: Diagnosis not present

## 2020-03-06 DIAGNOSIS — Z8673 Personal history of transient ischemic attack (TIA), and cerebral infarction without residual deficits: Secondary | ICD-10-CM | POA: Diagnosis not present

## 2020-03-07 ENCOUNTER — Telehealth: Payer: Self-pay

## 2020-03-07 DIAGNOSIS — R1314 Dysphagia, pharyngoesophageal phase: Secondary | ICD-10-CM | POA: Diagnosis not present

## 2020-03-07 DIAGNOSIS — R2681 Unsteadiness on feet: Secondary | ICD-10-CM | POA: Diagnosis not present

## 2020-03-07 DIAGNOSIS — R41841 Cognitive communication deficit: Secondary | ICD-10-CM | POA: Diagnosis not present

## 2020-03-07 DIAGNOSIS — M6281 Muscle weakness (generalized): Secondary | ICD-10-CM | POA: Diagnosis not present

## 2020-03-07 DIAGNOSIS — R262 Difficulty in walking, not elsewhere classified: Secondary | ICD-10-CM | POA: Diagnosis not present

## 2020-03-07 DIAGNOSIS — R1312 Dysphagia, oropharyngeal phase: Secondary | ICD-10-CM | POA: Diagnosis not present

## 2020-03-07 DIAGNOSIS — R079 Chest pain, unspecified: Secondary | ICD-10-CM

## 2020-03-07 MED ORDER — HYDROCODONE-ACETAMINOPHEN 5-325 MG PO TABS: 1.0000 | ORAL_TABLET | Freq: Three times a day (TID) | ORAL | 0 refills | Status: DC | PRN

## 2020-03-07 MED ORDER — FUROSEMIDE 20 MG PO TABS
20.0000 mg | ORAL_TABLET | Freq: Every day | ORAL | 0 refills | Status: DC
Start: 2020-03-07 — End: 2020-06-08

## 2020-03-07 NOTE — Telephone Encounter (Signed)
Please advise 

## 2020-03-07 NOTE — Telephone Encounter (Signed)
1.  Patient went to the ER, troponin elevated, suspect non-STEMI, declined admission, arrange cardiology referral. 2.  ER if chest pain, difficulty breathing, increasing LE  swelling. 3.  Send Lasix 20 mg daily, #30 no refills 4.  I see a request for hydrocodone, will send the prescription. 5.  Okay for PT evaluation and nurse follow-up

## 2020-03-07 NOTE — Telephone Encounter (Signed)
Estill Bamberg w/ [facility] called and is asking for nursing to f/up in regards to weeping legs (last week), Estill Bamberg saw pt yesterday, no weeping in the legs currently.  Pt is on 5 day course of Lasix from the ER.  Pt went in for chest pain and had elevated troponin levels and pt declined to stay overnight, so they wrote the Lasix and recommended a Cardiology f/up.  Nursing requesting a frequency of follow up of 2x a week for two weeks then once a week for 3 weeks.  Then every other week for 4 weeks and would like a PT eval due to a recent fall. CB#[phone]

## 2020-03-07 NOTE — Telephone Encounter (Signed)
Cardiology referral placed. Lasix 20mg  sent to [facility]. Spoke w/ Estill Bamberg- verbal orders given.

## 2020-03-08 ENCOUNTER — Ambulatory Visit: Payer: Medicare Other | Admitting: Internal Medicine

## 2020-03-11 ENCOUNTER — Telehealth: Payer: Self-pay

## 2020-03-11 DIAGNOSIS — G43909 Migraine, unspecified, not intractable, without status migrainosus: Secondary | ICD-10-CM | POA: Diagnosis not present

## 2020-03-11 DIAGNOSIS — F32A Depression, unspecified: Secondary | ICD-10-CM | POA: Diagnosis not present

## 2020-03-11 DIAGNOSIS — G40909 Epilepsy, unspecified, not intractable, without status epilepticus: Secondary | ICD-10-CM | POA: Diagnosis not present

## 2020-03-11 DIAGNOSIS — Z853 Personal history of malignant neoplasm of breast: Secondary | ICD-10-CM | POA: Diagnosis not present

## 2020-03-11 DIAGNOSIS — M810 Age-related osteoporosis without current pathological fracture: Secondary | ICD-10-CM | POA: Diagnosis not present

## 2020-03-11 DIAGNOSIS — F419 Anxiety disorder, unspecified: Secondary | ICD-10-CM | POA: Diagnosis not present

## 2020-03-11 DIAGNOSIS — I509 Heart failure, unspecified: Secondary | ICD-10-CM | POA: Diagnosis not present

## 2020-03-11 DIAGNOSIS — K5900 Constipation, unspecified: Secondary | ICD-10-CM | POA: Diagnosis not present

## 2020-03-11 DIAGNOSIS — I110 Hypertensive heart disease with heart failure: Secondary | ICD-10-CM | POA: Diagnosis not present

## 2020-03-11 DIAGNOSIS — I7300 Raynaud's syndrome without gangrene: Secondary | ICD-10-CM | POA: Diagnosis not present

## 2020-03-11 DIAGNOSIS — K219 Gastro-esophageal reflux disease without esophagitis: Secondary | ICD-10-CM | POA: Diagnosis not present

## 2020-03-11 DIAGNOSIS — Z7982 Long term (current) use of aspirin: Secondary | ICD-10-CM | POA: Diagnosis not present

## 2020-03-11 DIAGNOSIS — Z8673 Personal history of transient ischemic attack (TIA), and cerebral infarction without residual deficits: Secondary | ICD-10-CM

## 2020-03-11 NOTE — Telephone Encounter (Signed)
Plan of care signed and faxed back to [facility] at [phone]. Form sent for scanning.

## 2020-03-16 ENCOUNTER — Ambulatory Visit: Payer: Medicare Other | Admitting: Internal Medicine

## 2020-03-16 DIAGNOSIS — Z0289 Encounter for other administrative examinations: Secondary | ICD-10-CM

## 2020-03-23 ENCOUNTER — Encounter: Payer: Self-pay | Admitting: General Practice

## 2020-03-29 ENCOUNTER — Telehealth: Payer: Self-pay

## 2020-03-29 NOTE — Telephone Encounter (Signed)
Plan of care signed and faxed back to [facility] [phone]. Form sent for scanning.

## 2020-04-01 ENCOUNTER — Telehealth: Payer: Self-pay

## 2020-04-01 NOTE — Telephone Encounter (Signed)
Pt called in to r/s some missed appts as she has been in rehab.Marland KitchenMarland KitchenAOM

## 2020-04-05 DIAGNOSIS — E440 Moderate protein-calorie malnutrition: Secondary | ICD-10-CM | POA: Diagnosis not present

## 2020-04-06 DIAGNOSIS — M25552 Pain in left hip: Secondary | ICD-10-CM | POA: Diagnosis not present

## 2020-04-11 ENCOUNTER — Telehealth: Payer: Self-pay | Admitting: Internal Medicine

## 2020-04-11 ENCOUNTER — Ambulatory Visit: Payer: Medicare Other | Admitting: Internal Medicine

## 2020-04-11 MED ORDER — HYDROCODONE-ACETAMINOPHEN 5-325 MG PO TABS: 1.0000 | ORAL_TABLET | Freq: Three times a day (TID) | ORAL | 0 refills | Status: DC | PRN

## 2020-04-11 NOTE — Telephone Encounter (Signed)
PDMP okay, Rx sent 

## 2020-04-11 NOTE — Telephone Encounter (Signed)
Medication: HYDROcodone-acetaminophen (NORCO/VICODIN) 5-325 MG tablet [[id number]]   Has the patient contacted their pharmacy? No. (If no, request that the patient contact the pharmacy for the refill.) (If yes, when and what did the pharmacy advise?)  Preferred Pharmacy (with phone number or street name): [id number] [address], [place] [address]  [place], [place] [address]  Phone:  [phone] Fax:  [phone]  [place] #:  [id number]  Agent: Please be advised that RX refills may take up to 3 business days. We ask that you follow-up with your pharmacy.

## 2020-04-11 NOTE — Telephone Encounter (Signed)
Last written: 03/07/20 Last ov:01/25/20 Next ov: none Contract: 05/19/18 UDS: 06/29/19

## 2020-04-14 ENCOUNTER — Telehealth: Payer: Self-pay | Admitting: Internal Medicine

## 2020-04-14 ENCOUNTER — Other Ambulatory Visit: Payer: Self-pay

## 2020-04-14 DIAGNOSIS — D751 Secondary polycythemia: Secondary | ICD-10-CM

## 2020-04-14 MED ORDER — HYDROXYUREA 500 MG PO CAPS: 500.0000 mg | ORAL_CAPSULE | ORAL | 0 refills | Status: DC

## 2020-04-14 NOTE — Telephone Encounter (Signed)
Disregard this telephone note

## 2020-04-14 NOTE — Telephone Encounter (Signed)
Medication: hydroxyurea (HYDREA) 500 MG capsule [[id number]]   Has the patient contacted their pharmacy? No. (If no, request that the patient contact the pharmacy for the refill.) (If yes, when and what did the pharmacy advise?)  Preferred Pharmacy (with phone number or street name):  [id number] [address], [place] [address]  [place], [place] [address]  Phone:  [phone] Fax:  [phone]  [place] #:  [id number]        Agent: Please be advised that RX refills may take up to 3 business days. We ask that you follow-up with your pharmacy.

## 2020-04-22 ENCOUNTER — Ambulatory Visit (INDEPENDENT_AMBULATORY_CARE_PROVIDER_SITE_OTHER): Payer: Medicare Other | Admitting: Internal Medicine

## 2020-04-22 ENCOUNTER — Other Ambulatory Visit: Payer: Self-pay

## 2020-04-22 VITALS — BP 150/78 | HR 66 | Temp 97.6°F | Ht 66.0 in | Wt 121.0 lb

## 2020-04-22 DIAGNOSIS — R609 Edema, unspecified: Secondary | ICD-10-CM | POA: Diagnosis not present

## 2020-04-22 DIAGNOSIS — R634 Abnormal weight loss: Secondary | ICD-10-CM | POA: Diagnosis not present

## 2020-04-22 DIAGNOSIS — R079 Chest pain, unspecified: Secondary | ICD-10-CM | POA: Diagnosis not present

## 2020-04-22 DIAGNOSIS — M810 Age-related osteoporosis without current pathological fracture: Secondary | ICD-10-CM | POA: Diagnosis not present

## 2020-04-22 DIAGNOSIS — F341 Dysthymic disorder: Secondary | ICD-10-CM | POA: Diagnosis not present

## 2020-04-22 MED ORDER — NITROGLYCERIN 0.4 MG SL SUBL: 0.4000 mg | SUBLINGUAL_TABLET | SUBLINGUAL | 3 refills | Status: DC | PRN

## 2020-04-22 NOTE — Patient Instructions (Addendum)
Tylenol  500 mg OTC 2 tabs a day every 8 hours as needed for pain  Do not take hydrocodone anymore  Anxiety, you are currently lorazepam: Half tablet twice a day and 1 tablet at bedtime. Ativan increase the risk of falls, if you could do with only half tablet during the daytime that will be better.  If you have more chest pain: Take nitroglycerin every 5 minutes up to 3 times, if the pain is severe or does not go away: Call 911  Please get a Covid booster  GO TO THE LAB : Get the blood work     [place], [place] back for a checkup in 6 weeks

## 2020-04-22 NOTE — Progress Notes (Signed)
Subjective:    Patient ID: [name], female    DOB: 04-17-1930, 86 y.o.   MRN: [id number]  DOS:  04/22/2020 Type of visit - description: Follow-up  Since the last office visit she went to the ER twice, chart reviewed:  ER 02/19/2020: Was seen with a mechanical fall, the wheelchair slipped and she fell backwards.  No LOC.  CT head and neck no acute. ER 03/04/2020: [name] to the ER with chest pain, exertional, + S OB, second troponin was a slightly elevated.  They also noted swelling and possibly new onset CHF. Was recommended admission but patient declined.  Since the last ER visit reports she had a couple of mild episodes of chest pain, last episode ~ 2 weeks ago. Swelling was noted at the ER, lasix started, swelling resolved. She continues to having pain, left hip.  Currently treated with hydrocodone once daily.  Weight loss noted: Denies fever chills. Admits her  appetite is poor, she has some dental issues. Denies any night sweats. No nausea, vomiting, diarrhea. No dysphagia or odynophagia. Occasional cough.   Wt Readings from Last 3 Encounters:  04/22/20 121 lb (54.9 kg)  03/04/20 122 lb (55.3 kg)  01/25/20 124 lb (56.2 kg)     Review of Systems Denies fever chills   Past Medical History:  Diagnosis Date  . CAD (coronary artery disease)    mild nonobstructive disease on cath in 2003  . Cancer ([place])   . Cardiomyopathy    Probable Takotsubo, severe CP w/ normal cath in 1994. Severe CP in 2003 w/ widespread T wave inversions on ECG. Cath w/ minimal coronary disease but LV-gram showed periapical severe hypokinesis and basilar hyperkinesia (EF 40%). Last echo in 4/09 confirmed full LV functional recovery with EF 60%, no regional wall motion abnormalities, mild to moderate LVH.  Marland Kitchen Chronic diastolic CHF (congestive heart failure) ([place]) 09/13/2019  . CVA (cerebral infarction)   . Depression with anxiety 03-29-11   lost husband 3'09  . Diverticulitis   . DVT  (deep venous thrombosis) ([place])    after venous ablation, R leg  . Dyspnea   . E. coli gastroenteritis 03-29-11   8'10  . Fracture 09/10/07   L2, status post vertebroplasty of L2 performed by IR  . GERD (gastroesophageal reflux disease)   . Headache(784.0)    migraines  . Hemorrhoids   . Hiatal hernia 03-29-11   no nerve problems  . Hyperlipidemia   . Hyperplastic colon polyp 06/2001  . Hypertension   . Iron deficiency anemia due to chronic blood loss 12/30/2017  . OA (osteoarthritis) of knee 03-29-11   w/ bilateral knee pain-not a problem now  . OSA (obstructive sleep apnea) 03-29-11   no cpap used, not a problem now.  . Osteoporosis   . Otalgia of both ears    Dr. Simeon Craft  . Polycythemia   . Stroke [facility]) 03-29-11   CVA x2 -last 10'12-?TIA(visual problems)  . Varicose vein     Past Surgical History:  Procedure Laterality Date  . APPENDECTOMY    . BACK SURGERY    . BREAST BIOPSY    . BREAST CYST EXCISION    . BREAST LUMPECTOMY Left 2013   left stage I left breast cancer  . CHOLECYSTECTOMY  04/09/2011   Procedure: LAPAROSCOPIC CHOLECYSTECTOMY WITH INTRAOPERATIVE CHOLANGIOGRAM;  Surgeon: Odis Hollingshead, MD;  Location: [facility];  Service: General;  Laterality: N/A;  . Dental Extraction     L maxillary molar  .  INTRAMEDULLARY (IM) NAIL INTERTROCHANTERIC Left 12/25/2019   Procedure: INTRAMEDULLARY (IM) NAIL INTERTROCHANTRIC;  Surgeon: Paralee Cancel, MD;  Location: [facility];  Service: Orthopedics;  Laterality: Left;  . IR KYPHO THORACIC WITH BONE BIOPSY  09/15/2019  . IR VERTEBROPLASTY CERV/THOR BX INC UNI/BIL INC/INJECT/IMAGING  11/10/2019  . KYPHOSIS SURGERY  08/2007   cement used  . OVARIAN CYST SURGERY     left  . RADIOLOGY WITH ANESTHESIA N/A 11/10/2019   Procedure: VERTEBROPLASTY;  Surgeon: Luanne Bras, MD;  Location: [facility];  Service: Radiology;  Laterality: N/A;  . TONSILLECTOMY    . TOTAL KNEE ARTHROPLASTY  06/2010   left  . TUBAL LIGATION      Allergies as of  04/22/2020      Reactions   Penicillins Hives, Rash, Other (See Comments)   Has patient had a PCN reaction causing immediate rash, facial/tongue/throat swelling, SOB or lightheadedness with hypotension: Yes Has patient had a PCN reaction causing severe rash involving mucus membranes or skin necrosis: yes Has patient had a PCN reaction that required hospitalization: No Has patient had a PCN reaction occurring within the last 10 years: no If all of the above answers are "NO", then may proceed with Cephalosporin use. Other Reaction: OTHER REACTION   Valsartan Itching, Other (See Comments)   Atorvastatin Diarrhea   Carvedilol Other (See Comments)   "teeth hurt when I took it"   Ezetimibe Nausea Only   Fluvastatin Sodium Other (See Comments)   "Can't remember"   Magnesium Hydroxide Other (See Comments)   REACTION: triggers HAs   Meloxicam Other (See Comments)   Pt seeing auro's / spots.     Pneumovax [pneumococcal Polysaccharide Vaccine] Other (See Comments)   Local reaction   Quinapril Hcl Other (See Comments)    "feelings of tiredness"   Simvastatin Diarrhea   Topamax [topiramate] Itching   Vit D-vit E-safflower Oil Other (See Comments)   Headaches      Medication List       Accurate as of April 22, 2020 11:59 PM. If you have any questions, ask your nurse or doctor.        STOP taking these medications   HYDROcodone-acetaminophen 5-325 MG tablet Commonly known as: NORCO/VICODIN Stopped by: Kathlene November, MD     TAKE these medications   acetaminophen 500 MG tablet Commonly known as: TYLENOL Take 1,000 mg by mouth every 8 (eight) hours as needed for mild pain or moderate pain. NTE 6 tabs/24 hours   alendronate 70 MG tablet Commonly known as: FOSAMAX Take 1 tablet (70 mg total) by mouth every 7 (seven) days. Take with a full glass of water on an empty stomach.   aspirin 81 MG EC tablet Take 1 tablet (81 mg total) by mouth daily. Swallow whole.   bisacodyl 10 MG  suppository Commonly known as: DULCOLAX Place 1 suppository (10 mg total) rectally daily as needed for moderate constipation.   bismuth subsalicylate 156 FB/37HK suspension Commonly known as: PEPTO BISMOL Take 30 mLs by mouth every 4 (four) hours as needed for indigestion (heartburn).   Dermacloud Crea Apply 1 application topically See admin instructions. Apply to buttocks topically every shift for rash   docusate sodium 100 MG capsule Commonly known as: COLACE Take 100 mg by mouth 2 (two) times daily.   furosemide 20 MG tablet Commonly known as: Lasix Take 1 tablet (20 mg total) by mouth daily.   hydroxyurea 500 MG capsule Commonly known as: HYDREA Take 1 capsule (500 mg total) by  mouth every other day. May take with food to minimize GI side effects.   lactose free nutrition Liqd Take 237 mLs by mouth See admin instructions. Every shift   levETIRAcetam 500 MG tablet Commonly known as: KEPPRA Take 1 tablet (500 mg total) by mouth 2 (two) times daily.   Lidocaine 4 % Ptch LIDOCAINE 4% PATCH :Apply 1 patch topically daily and remove after 12 hours to left lateral chest What changed:   how much to take  how to take this  when to take this  additional instructions   LORazepam 0.5 MG tablet Commonly known as: ATIVAN Take 1/2 tablet by mouth twice daily, then 1 tablet at bedtime   metoprolol tartrate 50 MG tablet Commonly known as: LOPRESSOR Take 1 tablet (50 mg total) by mouth 2 (two) times daily.   multivitamin with minerals Tabs tablet Take 1 tablet by mouth daily.   nitroGLYCERIN 0.4 MG SL tablet Commonly known as: NITROSTAT Place 1 tablet (0.4 mg total) under the tongue every 5 (five) minutes x 3 doses as needed for chest pain. Started by: Kathlene November, MD   omeprazole 20 MG capsule Commonly known as: PRILOSEC Take 1 capsule (20 mg total) by mouth daily.   ondansetron 4 MG tablet Commonly known as: ZOFRAN Take 1 tablet (4 mg total) by mouth every 6 (six)  hours as needed for nausea or vomiting.   polyethylene glycol 17 g packet Commonly known as: MIRALAX / GLYCOLAX Take 17 g by mouth daily.   tiZANidine 2 MG tablet Commonly known as: ZANAFLEX Take 2 mg by mouth every 6 (six) hours as needed for muscle spasms.          Objective:   Physical Exam BP (!) 150/78 (BP Location: Right Arm, Patient Position: Sitting, Cuff Size: Large)   Pulse 66   Temp 97.6 F (36.4 C) (Oral)   Ht 5\' 6"  (1.676 m)   Wt 121 lb (54.9 kg)   SpO2 98%   BMI 19.53 kg/m  General:   Well developed, NAD, BMI noted.  HEENT:  Normocephalic . Face symmetric, atraumatic Lungs:  CTA B Normal respiratory effort, no intercostal retractions, no accessory muscle use. Heart: RRR,  no murmur.  Abdomen:  Not distended, soft, non-tender. No rebound or rigidity.   Skin: Not pale. Not jaundice Lower extremities: There is periankle edema, worse on the right. Neurologic:  alert & oriented X3.  Speech normal, gait assisted by a walker Psych--  Cognition and judgment appear intact.  Cooperative with normal attention span and concentration.  Behavior appropriate. No anxious or depressed appearing.     Assessment     Assessment  HTN Dyslipidemia: Intolerant to most medicines including Zetia, Pravachol, Niaspan Depression, anxiety , insomnia - on ativan bid (03-2015 citalopram cause drowsiness, 04-2015 prozac:diarrhea) Hem-Onc: POLYCYTEMIA: sees hem-onc H/o breast ca  MSK: ---DJD :  Tylenol #3, uses rarely ---Osteoporosis:  T score -2.8 (02-2013): declined treatment 06-2017 ---L2 fractures, s/p vertebral plasty 2009 Migraine headaches - Tylenol #3 (rarely)  GI: GERD- intolerant to prevacid, visual disturbances HH, diverticulitis, Colon polyps  h/o "functional " LLQ abd pain CV: ---Cardiomyopathy Probable Takotsubo cardiomyopathy: CP w/ normal cath in 1994.   Severe CP  2003 with widespread T wave inversions on ECG.  Cath with minimal coronary disease but  LV-gram showed periapical severe hypokinesis and basilar hyperkinesia (EF 40%) >>> ECHO 4/09 confirmed full LV functional recovery with EF 60%, mild LVH. 2012: Nl EF per echo, (-) Lexiscan 05/21/18 ECHO Nl/stable ---  Stroke / TIA 2012 Sleep apnea 2012, , not on CPAP H/oDVT, right leg after venous ablation Raynaud phenomena, DX 05/2018  PLAN We discussed multiple issues. Frequent falls: Had a vertebroplasty 10-2019, L hip fx 12/2019. She left [facility] and went home 2 weeks ago, doing okay.  Has a helper 3 times a week. Pain management: Currently on hydrocodone daily 1 tablet, stop hydrocodone due to increased risk of falls, stick with Tylenol. Anxiety: Currently managed with Ativan: 0.5 AM-0.5PM-1 tablet at bedtime, she has been intolerant to SSRIs, I recommend to stop but the patient is really concerned about her anxiety.  She is very aware of the increased risk of falls with benzos.  Contract signed. Osteoporosis: Vertebroplasty 10/2019, left hip fracture-2021 New T8 vertebral fracture right sixth rib fracture per x-ray 03/04/2020.  Currently with no major pain in that area.  Previously declined treatment but now is on Fosamax. Weight loss: No fever chills or night sweats.No dysphagia or odynophagia.  No blood in the stools.  Chest x-ray 03/04/20: no acute changes in lung fields.last CBC with no anemia. Plan: Check a TSH, repeat CBC, will see hematology next week, she is at risk of hematology malignancies. Chest pain, edema: Seen at the ER 03/04/2020, they suspected new onset CHF and angina, patient declined at admission.  Had a couple of episodes of chest pain since, none in the last 2 weeks.  They started furosemide , edema decreased,  checking a BMP today EKG today: NSR, no acute changes. Plan: Cardiology referral.Rx NTG Recommend Covid booster RTC 6 weeks   Time spent with the patient 41 minutes due to extensive chart review, multiple issues discussed including the need to stop  hydrocodone and benzos.   This visit occurred during the SARS-CoV-2 public health emergency.  Safety protocols were in place, including screening questions prior to the visit, additional usage of staff PPE, and extensive cleaning of exam room while observing appropriate contact time as indicated for disinfecting solutions.

## 2020-04-23 LAB — BASIC METABOLIC PANEL
BUN/Creatinine Ratio: 15 (calc) (ref 6–22)
BUN: 8 mg/dL (ref 7–25)
CO2: 30 mmol/L (ref 20–32)
Calcium: 9.9 mg/dL (ref 8.6–10.4)
Chloride: 102 mmol/L (ref 98–110)
Creat: 0.55 mg/dL — ABNORMAL LOW (ref 0.60–0.88)
Glucose, Bld: 116 mg/dL — ABNORMAL HIGH (ref 65–99)
Potassium: 4.9 mmol/L (ref 3.5–5.3)
Sodium: 139 mmol/L (ref 135–146)

## 2020-04-23 LAB — CBC WITH DIFFERENTIAL/PLATELET
Absolute Monocytes: 487 cells/uL (ref 200–950)
Basophils Absolute: 42 cells/uL (ref 0–200)
Basophils Relative: 1 %
Eosinophils Absolute: 113 cells/uL (ref 15–500)
Eosinophils Relative: 2.7 %
HCT: 44.9 % (ref 35.0–45.0)
Hemoglobin: 15.1 g/dL (ref 11.7–15.5)
Lymphs Abs: 760 cells/uL — ABNORMAL LOW (ref 850–3900)
MCH: 34.9 pg — ABNORMAL HIGH (ref 27.0–33.0)
MCHC: 33.6 g/dL (ref 32.0–36.0)
MCV: 103.7 fL — ABNORMAL HIGH (ref 80.0–100.0)
MPV: 10.4 fL (ref 7.5–12.5)
Monocytes Relative: 11.6 %
Neutro Abs: 2797 cells/uL (ref 1500–7800)
Neutrophils Relative %: 66.6 %
Platelets: 358 10*3/uL (ref 140–400)
RBC: 4.33 10*6/uL (ref 3.80–5.10)
RDW: 12 % (ref 11.0–15.0)
Total Lymphocyte: 18.1 %
WBC: 4.2 10*3/uL (ref 3.8–10.8)

## 2020-04-23 LAB — TSH: TSH: 2.61 mIU/L (ref 0.40–4.50)

## 2020-04-23 NOTE — Assessment & Plan Note (Signed)
We discussed multiple issues. Frequent falls: Had a vertebroplasty 10-2019, L hip fx 12/2019. She left [facility] and went home 2 weeks ago, doing okay.  Has a helper 3 times a week. Pain management: Currently on hydrocodone daily 1 tablet, stop hydrocodone due to increased risk of falls, stick with Tylenol. Anxiety: Currently managed with Ativan: 0.5 AM-0.5PM-1 tablet at bedtime, she has been intolerant to SSRIs, I recommend to stop but the patient is really concerned about her anxiety.  She is very aware of the increased risk of falls with benzos.  Contract signed. Osteoporosis: Vertebroplasty 10/2019, left hip fracture-2021 New T8 vertebral fracture right sixth rib fracture per x-ray 03/04/2020.  Currently with no major pain in that area.  Previously declined treatment but now is on Fosamax. Weight loss: No fever chills or night sweats.No dysphagia or odynophagia.  No blood in the stools.  Chest x-ray 03/04/20: no acute changes in lung fields.last CBC with no anemia. Plan: Check a TSH, repeat CBC, will see hematology next week, she is at risk of hematology malignancies. Chest pain, edema: Seen at the ER 03/04/2020, they suspected new onset CHF and angina, patient declined at admission.  Had a couple of episodes of chest pain since, none in the last 2 weeks.  They started furosemide , edema decreased,  checking a BMP today EKG today: NSR, no acute changes. Plan: Cardiology referral.Rx NTG Recommend Covid booster RTC 6 weeks

## 2020-04-28 DIAGNOSIS — Z23 Encounter for immunization: Secondary | ICD-10-CM | POA: Diagnosis not present

## 2020-04-29 ENCOUNTER — Other Ambulatory Visit: Payer: Self-pay | Admitting: Family

## 2020-04-29 ENCOUNTER — Other Ambulatory Visit: Payer: Self-pay

## 2020-04-29 ENCOUNTER — Encounter: Payer: Self-pay | Admitting: Family

## 2020-04-29 ENCOUNTER — Inpatient Hospital Stay: Payer: Medicare Other | Attending: Hematology & Oncology

## 2020-04-29 ENCOUNTER — Inpatient Hospital Stay (HOSPITAL_BASED_OUTPATIENT_CLINIC_OR_DEPARTMENT_OTHER): Payer: Medicare Other | Admitting: Family

## 2020-04-29 VITALS — BP 126/76 | HR 68 | Temp 97.7°F | Resp 19 | Ht 66.0 in | Wt 121.0 lb

## 2020-04-29 DIAGNOSIS — D751 Secondary polycythemia: Secondary | ICD-10-CM | POA: Diagnosis not present

## 2020-04-29 DIAGNOSIS — D45 Polycythemia vera: Secondary | ICD-10-CM | POA: Diagnosis not present

## 2020-04-29 DIAGNOSIS — D500 Iron deficiency anemia secondary to blood loss (chronic): Secondary | ICD-10-CM

## 2020-04-29 DIAGNOSIS — Z853 Personal history of malignant neoplasm of breast: Secondary | ICD-10-CM | POA: Insufficient documentation

## 2020-04-29 DIAGNOSIS — D509 Iron deficiency anemia, unspecified: Secondary | ICD-10-CM | POA: Insufficient documentation

## 2020-04-29 DIAGNOSIS — R21 Rash and other nonspecific skin eruption: Secondary | ICD-10-CM

## 2020-04-29 LAB — LACTATE DEHYDROGENASE: LDH: 132 U/L (ref 98–192)

## 2020-04-29 LAB — CBC WITH DIFFERENTIAL (CANCER CENTER ONLY)
Abs Immature Granulocytes: 0.02 10*3/uL (ref 0.00–0.07)
Basophils Absolute: 0 10*3/uL (ref 0.0–0.1)
Basophils Relative: 1 %
Eosinophils Absolute: 0.1 10*3/uL (ref 0.0–0.5)
Eosinophils Relative: 2 %
HCT: 45.8 % (ref 36.0–46.0)
Hemoglobin: 14.6 g/dL (ref 12.0–15.0)
Immature Granulocytes: 0 %
Lymphocytes Relative: 14 %
Lymphs Abs: 0.8 10*3/uL (ref 0.7–4.0)
MCH: 34.1 pg — ABNORMAL HIGH (ref 26.0–34.0)
MCHC: 31.9 g/dL (ref 30.0–36.0)
MCV: 107 fL — ABNORMAL HIGH (ref 80.0–100.0)
Monocytes Absolute: 0.5 10*3/uL (ref 0.1–1.0)
Monocytes Relative: 10 %
Neutro Abs: 3.9 10*3/uL (ref 1.7–7.7)
Neutrophils Relative %: 73 %
Platelet Count: 371 10*3/uL (ref 150–400)
RBC: 4.28 MIL/uL (ref 3.87–5.11)
RDW: 13.2 % (ref 11.5–15.5)
WBC Count: 5.3 10*3/uL (ref 4.0–10.5)
nRBC: 0 % (ref 0.0–0.2)

## 2020-04-29 LAB — CMP (CANCER CENTER ONLY)
ALT: 12 U/L (ref 0–44)
AST: 20 U/L (ref 15–41)
Albumin: 3.9 g/dL (ref 3.5–5.0)
Alkaline Phosphatase: 82 U/L (ref 38–126)
Anion gap: 5 (ref 5–15)
BUN: 11 mg/dL (ref 8–23)
CO2: 32 mmol/L (ref 22–32)
Calcium: 10 mg/dL (ref 8.9–10.3)
Chloride: 100 mmol/L (ref 98–111)
Creatinine: 0.61 mg/dL (ref 0.44–1.00)
GFR, Estimated: >60 mL/min (ref >60)
Glucose, Bld: 111 mg/dL — ABNORMAL HIGH (ref 70–99)
Potassium: 4.4 mmol/L (ref 3.5–5.1)
Sodium: 137 mmol/L (ref 135–145)
Total Bilirubin: 1.1 mg/dL (ref 0.3–1.2)
Total Protein: 6.9 g/dL (ref 6.5–8.1)

## 2020-04-29 MED FILL — VALACYCLOVIR HCL 500 MG TAB: 500 | 15 days supply | Qty: 30 | Fill #0

## 2020-04-29 NOTE — Progress Notes (Signed)
Hematology and Oncology Follow Up Visit  [name] [id number] [birth year] 86 y.o. 04/29/2020   Principle Diagnosis:  Polycythemia vera - JAK2 positive Stage I (T1bN0M0 ) infiltrating duct carcinoma the left breast  Iron deficiency anemia -- symptomatic  Past Therapy: Femara 2.5 mg by mouth daily - stopped 08/2015  Current Therapy: Phlebotomy to maintain hematocrit below 45% Hydrea 500 mg by mouth every other day-- re-started on 07/16/2019 IV Iron as needed   Interim History:  [name] is here today for follow-up. We last saw her in June. Since then she has had several falls one of which she sustained a fractured left hip in August. She was in a SNF for 3 month but was able to go home several weeks ago.  She had SOB and chest pain in October. Her work up in the ED revealed suspected new onset of CHF but she refused to be admitted for further work up.  She still has fatigue and weakness and states that she has had a hard time eating since getting her dentures.  She is staying well hydrated. She has not noted any fluid retention and is taking her lasix as needed.  She has been trying to eat a little red meat as well as drinking sports drinks for the added vitamins and minerals.  She drinks one protein shake a day.   No fever, chills, n/v, cough, rash, dizziness, SOB, chest pain, palpitations, abdominal pain or changes in bowel or bladder habits.  No swelling, numbness or tingling in her extremities at this time.  She has tenderness in the left leg and lower back from previous fractures.  She is ambulating with a Rolator and states that she has not had any new falls since October.  No episodes of bleeding. No abnormal bruising, no petechiae.   ECOG Performance Status: 1 - Symptomatic but completely ambulatory  Medications:  Allergies as of 04/29/2020      Reactions   Penicillins Hives, Rash, Other (See Comments)   Has patient had a PCN reaction causing  immediate rash, facial/tongue/throat swelling, SOB or lightheadedness with hypotension: Yes Has patient had a PCN reaction causing severe rash involving mucus membranes or skin necrosis: yes Has patient had a PCN reaction that required hospitalization: No Has patient had a PCN reaction occurring within the last 10 years: no If all of the above answers are "NO", then may proceed with Cephalosporin use. Other Reaction: OTHER REACTION   Valsartan Itching, Other (See Comments)   Atorvastatin Diarrhea   Carvedilol Other (See Comments)   "teeth hurt when I took it"   Ezetimibe Nausea Only   Fluvastatin Sodium Other (See Comments)   "Can't remember"   Magnesium Hydroxide Other (See Comments)   REACTION: triggers HAs   Meloxicam Other (See Comments)   Pt seeing auro's / spots.     Pneumovax [pneumococcal Polysaccharide Vaccine] Other (See Comments)   Local reaction   Quinapril Hcl Other (See Comments)    "feelings of tiredness"   Simvastatin Diarrhea   Topamax [topiramate] Itching   Vit D-vit E-safflower Oil Other (See Comments)   Headaches      Medication List       Accurate as of April 29, 2020  3:00 PM. If you have any questions, ask your nurse or doctor.        acetaminophen 500 MG tablet Commonly known as: TYLENOL Take 1,000 mg by mouth every 8 (eight) hours as needed for mild pain or moderate pain. NTE 6  tabs/24 hours   alendronate 70 MG tablet Commonly known as: FOSAMAX Take 1 tablet (70 mg total) by mouth every 7 (seven) days. Take with a full glass of water on an empty stomach.   aspirin 81 MG EC tablet Take 1 tablet (81 mg total) by mouth daily. Swallow whole.   bisacodyl 10 MG suppository Commonly known as: DULCOLAX Place 1 suppository (10 mg total) rectally daily as needed for moderate constipation.   bismuth subsalicylate 676 HM/09OB suspension Commonly known as: PEPTO BISMOL Take 30 mLs by mouth every 4 (four) hours as needed for indigestion (heartburn).    Dermacloud Crea Apply 1 application topically See admin instructions. Apply to buttocks topically every shift for rash   docusate sodium 100 MG capsule Commonly known as: COLACE Take 100 mg by mouth 2 (two) times daily.   furosemide 20 MG tablet Commonly known as: Lasix Take 1 tablet (20 mg total) by mouth daily.   hydroxyurea 500 MG capsule Commonly known as: HYDREA Take 1 capsule (500 mg total) by mouth every other day. May take with food to minimize GI side effects.   lactose free nutrition Liqd Take 237 mLs by mouth See admin instructions. Every shift   levETIRAcetam 500 MG tablet Commonly known as: KEPPRA Take 1 tablet (500 mg total) by mouth 2 (two) times daily.   Lidocaine 4 % Ptch LIDOCAINE 4% PATCH :Apply 1 patch topically daily and remove after 12 hours to left lateral chest What changed:   how much to take  how to take this  when to take this  additional instructions   LORazepam 0.5 MG tablet Commonly known as: ATIVAN Take 1/2 tablet by mouth twice daily, then 1 tablet at bedtime   metoprolol tartrate 50 MG tablet Commonly known as: LOPRESSOR Take 1 tablet (50 mg total) by mouth 2 (two) times daily.   multivitamin with minerals Tabs tablet Take 1 tablet by mouth daily.   nitroGLYCERIN 0.4 MG SL tablet Commonly known as: NITROSTAT Place 1 tablet (0.4 mg total) under the tongue every 5 (five) minutes x 3 doses as needed for chest pain.   omeprazole 20 MG capsule Commonly known as: PRILOSEC Take 1 capsule (20 mg total) by mouth daily.   ondansetron 4 MG tablet Commonly known as: ZOFRAN Take 1 tablet (4 mg total) by mouth every 6 (six) hours as needed for nausea or vomiting.   polyethylene glycol 17 g packet Commonly known as: MIRALAX / GLYCOLAX Take 17 g by mouth daily.   tiZANidine 2 MG tablet Commonly known as: ZANAFLEX Take 2 mg by mouth every 6 (six) hours as needed for muscle spasms.   valACYclovir 500 MG tablet Commonly known as:  VALTREX TAKE 2 TABLETS BY MOUTH AT ONSET OF RASH AND 2 TABLETS 12 HOURS LATER AS NEEDED Started by: [name], NP       Allergies:  Allergies  Allergen Reactions  . Penicillins Hives, Rash and Other (See Comments)    Has patient had a PCN reaction causing immediate rash, facial/tongue/throat swelling, SOB or lightheadedness with hypotension: Yes Has patient had a PCN reaction causing severe rash involving mucus membranes or skin necrosis: yes Has patient had a PCN reaction that required hospitalization: No Has patient had a PCN reaction occurring within the last 10 years: no If all of the above answers are "NO", then may proceed with Cephalosporin use.  Other Reaction: OTHER REACTION  . Valsartan Itching and Other (See Comments)  . Atorvastatin Diarrhea  . Carvedilol  Other (See Comments)    "teeth hurt when I took it"   . Ezetimibe Nausea Only  . Fluvastatin Sodium Other (See Comments)    "Can't remember"  . Magnesium Hydroxide Other (See Comments)    REACTION: triggers HAs  . Meloxicam Other (See Comments)    Pt seeing auro's / spots.    . Pneumovax [Pneumococcal Polysaccharide Vaccine] Other (See Comments)    Local reaction  . Quinapril Hcl Other (See Comments)     "feelings of tiredness"  . Simvastatin Diarrhea  . Topamax [Topiramate] Itching  . Vit D-Vit E-Safflower Oil Other (See Comments)    Headaches    Past Medical History, Surgical history, Social history, and Family History were reviewed and updated.  Review of Systems: All other 10 point review of systems is negative.   Physical Exam:  vitals were not taken for this visit.   Wt Readings from Last 3 Encounters:  04/22/20 121 lb (54.9 kg)  03/04/20 122 lb (55.3 kg)  01/25/20 124 lb (56.2 kg)    Ocular: Sclerae unicteric, pupils equal, round and reactive to light Ear-nose-throat: Oropharynx clear, dentition fair Lymphatic: No cervical or supraclavicular adenopathy Lungs no rales or rhonchi, good  excursion bilaterally Heart regular rate and rhythm, no murmur appreciated Abd soft, nontender, positive bowel sounds MSK no focal spinal tenderness, no joint edema Neuro: non-focal, well-oriented, appropriate affect Breasts: Deferred   Lab Results  Component Value Date   WBC 5.3 04/29/2020   HGB 14.6 04/29/2020   HCT 45.8 04/29/2020   MCV 107.0 (H) 04/29/2020   PLT 371 04/29/2020   Lab Results  Component Value Date   FERRITIN 57 10/29/2019   IRON 107 10/29/2019   TIBC 353 10/29/2019   UIBC 246 10/29/2019   IRONPCTSAT 30 10/29/2019   Lab Results  Component Value Date   RETICCTPCT 0.8 02/02/2015   RBC 4.28 04/29/2020   RETICCTABS 41.4 02/02/2015   No results found for: KPAFRELGTCHN, LAMBDASER, KAPLAMBRATIO No results found for: IGGSERUM, IGA, IGMSERUM No results found for: Odetta Pink, SPEI   Chemistry      Component Value Date/Time   NA 139 04/22/2020 1444   NA 142 03/25/2017 1457   NA 139 04/10/2016 0855   K 4.9 04/22/2020 1444   K 4.1 03/25/2017 1457   K 4.2 04/10/2016 0855   CL 102 04/22/2020 1444   CL 96 (L) 03/25/2017 1457   CO2 30 04/22/2020 1444   CO2 32 03/25/2017 1457   CO2 29 04/10/2016 0855   BUN 8 04/22/2020 1444   BUN 7 03/25/2017 1457   BUN 10.0 04/10/2016 0855   CREATININE 0.55 (L) 04/22/2020 1444   CREATININE 0.8 04/10/2016 0855      Component Value Date/Time   CALCIUM 9.9 04/22/2020 1444   CALCIUM 9.8 03/25/2017 1457   CALCIUM 9.5 04/10/2016 0855   ALKPHOS 56 12/26/2019 0252   ALKPHOS 86 (H) 03/25/2017 1457   ALKPHOS 85 04/10/2016 0855   AST 22 12/26/2019 0252   AST 18 10/29/2019 1355   AST 19 04/10/2016 0855   ALT 12 12/26/2019 0252   ALT 12 10/29/2019 1355   ALT 15 03/25/2017 1457   ALT 13 04/10/2016 0855   BILITOT 1.7 (H) 12/26/2019 0252   BILITOT 1.6 (H) 10/29/2019 1355   BILITOT 1.26 (H) 04/10/2016 0855       Impression and Plan: Ms. Mariner is a very pleasant 86 yo  caucasian female with polycythemia and history of  stage I infiltrating ductal carcinoma of the left breast with lumpectomy in 2013 as well as polycythemia, JAK2 positive.  She verbalized that she is taking her Hydrea 500 mg PO every other day as prescribed. Her counts have remained stable.  I spoke with Dr. Marin Olp and we will hold off on phlebotomizing her at this time for Hct 45.8%.  We will plan to see her again in 8 weeks.  She was encouraged to contact our office with any questions or concerns.   [name], NP 12/17/20213:00 PM

## 2020-05-02 ENCOUNTER — Telehealth: Payer: Self-pay

## 2020-05-02 LAB — FERRITIN: Ferritin: 65 ng/mL (ref 11–307)

## 2020-05-02 LAB — IRON AND TIBC
Iron: 99 ug/dL (ref 41–142)
Saturation Ratios: 29 % (ref 21–57)
TIBC: 339 ug/dL (ref 236–444)
UIBC: 239 ug/dL (ref 120–384)

## 2020-05-02 NOTE — Telephone Encounter (Signed)
S/w pt per 04/29/20 los and she is aware of her appts, a copy has also been mailed to her    [facility]

## 2020-05-16 ENCOUNTER — Telehealth: Payer: Self-pay | Admitting: Internal Medicine

## 2020-05-16 NOTE — Telephone Encounter (Signed)
Requesting: lorazepam 0.5mg  Contract: 04/22/2020 UDS: 06/29/2019 Last Visit: 04/22/2020 Next Visit: 06/08/2020 Last Refill: 01/26/2020 #60 and 1RF  Please Advise

## 2020-05-17 NOTE — Telephone Encounter (Signed)
PDMP okay, prescription sent 

## 2020-05-27 ENCOUNTER — Ambulatory Visit: Payer: Medicare Other

## 2020-06-07 ENCOUNTER — Ambulatory Visit: Payer: Medicare Other | Admitting: Internal Medicine

## 2020-06-08 ENCOUNTER — Encounter: Payer: Self-pay | Admitting: Internal Medicine

## 2020-06-08 ENCOUNTER — Other Ambulatory Visit: Payer: Self-pay

## 2020-06-08 ENCOUNTER — Ambulatory Visit (INDEPENDENT_AMBULATORY_CARE_PROVIDER_SITE_OTHER): Payer: Medicare Other | Admitting: Internal Medicine

## 2020-06-08 VITALS — BP 152/82 | HR 71 | Temp 97.0°F | Ht 66.0 in | Wt 121.0 lb

## 2020-06-08 DIAGNOSIS — R296 Repeated falls: Secondary | ICD-10-CM | POA: Diagnosis not present

## 2020-06-08 DIAGNOSIS — Z8679 Personal history of other diseases of the circulatory system: Secondary | ICD-10-CM

## 2020-06-08 DIAGNOSIS — R269 Unspecified abnormalities of gait and mobility: Secondary | ICD-10-CM | POA: Diagnosis not present

## 2020-06-08 DIAGNOSIS — I5032 Chronic diastolic (congestive) heart failure: Secondary | ICD-10-CM

## 2020-06-08 DIAGNOSIS — I5181 Takotsubo syndrome: Secondary | ICD-10-CM

## 2020-06-08 DIAGNOSIS — I7300 Raynaud's syndrome without gangrene: Secondary | ICD-10-CM

## 2020-06-08 DIAGNOSIS — M810 Age-related osteoporosis without current pathological fracture: Secondary | ICD-10-CM

## 2020-06-08 DIAGNOSIS — I10 Essential (primary) hypertension: Secondary | ICD-10-CM

## 2020-06-08 MED ORDER — FUROSEMIDE 20 MG PO TABS
20.0000 mg | ORAL_TABLET | Freq: Every day | ORAL | 3 refills | Status: DC
Start: 2020-06-08 — End: 2020-11-21

## 2020-06-08 MED ORDER — LEVETIRACETAM 250 MG PO TABS: 250.0000 mg | ORAL_TABLET | Freq: Two times a day (BID) | ORAL | 3 refills | Status: DC

## 2020-06-08 MED ORDER — ALENDRONATE SODIUM 70 MG PO TABS
70.0000 mg | ORAL_TABLET | ORAL | 3 refills | Status: DC
Start: 2020-06-08 — End: 2021-08-09

## 2020-06-08 NOTE — Patient Instructions (Addendum)
Go back on Keppra, will switch you to a 250 mg: 1 tablet twice a day.  Go back on furosemide, we sent a prescription  Go back on alendronate 1 tablet a week.  Take it on an empty stomach with 2 glasses of water.    GO TO THE FRONT DESK, PLEASE SCHEDULE YOUR APPOINTMENTS Come back for   nurse visit next week, bring all your bottles of medicines  Come back for a checkup with me in 3 months

## 2020-06-08 NOTE — Progress Notes (Signed)
Subjective:    Patient ID: [name], female    DOB: Apr 24, 1930, 87 y.o.   MRN: [id number]  DOS:  06/08/2020 Type of visit - description: Follow-up  Since the last office visit, reports no further chest pain. Did report that her feet were black/purple for 2 weeks, they now looking better. No swelling, no pain.  Has discontinued Keppra due to diarrhea  Compliance is an issue, ran out of Lasix, not taking alendronate "I did not know what it was"   Wt Readings from Last 3 Encounters:  06/08/20 121 lb (54.9 kg)  04/29/20 121 lb (54.9 kg)  04/22/20 121 lb (54.9 kg)    Review of Systems See above   Past Medical History:  Diagnosis Date  . CAD (coronary artery disease)    mild nonobstructive disease on cath in 2003  . Cancer ([place])   . Cardiomyopathy    Probable Takotsubo, severe CP w/ normal cath in 1994. Severe CP in 2003 w/ widespread T wave inversions on ECG. Cath w/ minimal coronary disease but LV-gram showed periapical severe hypokinesis and basilar hyperkinesia (EF 40%). Last echo in 4/09 confirmed full LV functional recovery with EF 60%, no regional wall motion abnormalities, mild to moderate LVH.  Marland Kitchen Chronic diastolic CHF (congestive heart failure) ([place]) 09/13/2019  . CVA (cerebral infarction)   . Depression with anxiety 03-29-11   lost husband 3'09  . Diverticulitis   . DVT (deep venous thrombosis) ([place])    after venous ablation, R leg  . Dyspnea   . E. coli gastroenteritis 03-29-11   8'10  . Fracture 09/10/07   L2, status post vertebroplasty of L2 performed by IR  . GERD (gastroesophageal reflux disease)   . Headache(784.0)    migraines  . Hemorrhoids   . Hiatal hernia 03-29-11   no nerve problems  . Hyperlipidemia   . Hyperplastic colon polyp 06/2001  . Hypertension   . Iron deficiency anemia due to chronic blood loss 12/30/2017  . OA (osteoarthritis) of knee 03-29-11   w/ bilateral knee pain-not a problem now  . OSA (obstructive sleep apnea) 03-29-11    no cpap used, not a problem now.  . Osteoporosis   . Otalgia of both ears    Dr. Simeon Craft  . Polycythemia   . Stroke [facility]) 03-29-11   CVA x2 -last 10'12-?TIA(visual problems)  . Varicose vein     Past Surgical History:  Procedure Laterality Date  . APPENDECTOMY    . BACK SURGERY    . BREAST BIOPSY    . BREAST CYST EXCISION    . BREAST LUMPECTOMY Left 2013   left stage I left breast cancer  . CHOLECYSTECTOMY  04/09/2011   Procedure: LAPAROSCOPIC CHOLECYSTECTOMY WITH INTRAOPERATIVE CHOLANGIOGRAM;  Surgeon: Odis Hollingshead, MD;  Location: [facility];  Service: General;  Laterality: N/A;  . Dental Extraction     L maxillary molar  . INTRAMEDULLARY (IM) NAIL INTERTROCHANTERIC Left 12/25/2019   Procedure: INTRAMEDULLARY (IM) NAIL INTERTROCHANTRIC;  Surgeon: Paralee Cancel, MD;  Location: [facility];  Service: Orthopedics;  Laterality: Left;  . IR KYPHO THORACIC WITH BONE BIOPSY  09/15/2019  . IR VERTEBROPLASTY CERV/THOR BX INC UNI/BIL INC/INJECT/IMAGING  11/10/2019  . KYPHOSIS SURGERY  08/2007   cement used  . OVARIAN CYST SURGERY     left  . RADIOLOGY WITH ANESTHESIA N/A 11/10/2019   Procedure: VERTEBROPLASTY;  Surgeon: Luanne Bras, MD;  Location: [place];  Service: Radiology;  Laterality: N/A;  . TONSILLECTOMY    .  TOTAL KNEE ARTHROPLASTY  06/2010   left  . TUBAL LIGATION      Allergies as of 06/08/2020      Reactions   Penicillins Hives, Rash, Other (See Comments)   Has patient had a PCN reaction causing immediate rash, facial/tongue/throat swelling, SOB or lightheadedness with hypotension: Yes Has patient had a PCN reaction causing severe rash involving mucus membranes or skin necrosis: yes Has patient had a PCN reaction that required hospitalization: No Has patient had a PCN reaction occurring within the last 10 years: no If all of the above answers are "NO", then may proceed with Cephalosporin use. Other Reaction: OTHER REACTION   Valsartan Itching, Other (See Comments)    Atorvastatin Diarrhea   Carvedilol Other (See Comments)   "teeth hurt when I took it"   Ezetimibe Nausea Only   Fluvastatin Sodium Other (See Comments)   "Can't remember"   Magnesium Hydroxide Other (See Comments)   REACTION: triggers HAs   Meloxicam Other (See Comments)   Pt seeing auro's / spots.     Pneumovax [pneumococcal Polysaccharide Vaccine] Other (See Comments)   Local reaction   Quinapril Hcl Other (See Comments)    "feelings of tiredness"   Simvastatin Diarrhea   Topamax [topiramate] Itching   Vit D-vit E-safflower Oil Other (See Comments)   Headaches      Medication List       Accurate as of June 08, 2020 11:59 PM. If you have any questions, ask your nurse or doctor.        acetaminophen 500 MG tablet Commonly known as: TYLENOL Take 1,000 mg by mouth every 8 (eight) hours as needed for mild pain or moderate pain. NTE 6 tabs/24 hours   alendronate 70 MG tablet Commonly known as: FOSAMAX Take 1 tablet (70 mg total) by mouth every 7 (seven) days. Take with a full glass of water on an empty stomach.   aspirin 81 MG EC tablet Take 1 tablet (81 mg total) by mouth daily. Swallow whole.   bisacodyl 10 MG suppository Commonly known as: DULCOLAX Place 1 suppository (10 mg total) rectally daily as needed for moderate constipation.   bismuth subsalicylate 161 WR/60AV suspension Commonly known as: PEPTO BISMOL Take 30 mLs by mouth every 4 (four) hours as needed for indigestion (heartburn).   Dermacloud Crea Apply 1 application topically See admin instructions. Apply to buttocks topically every shift for rash   docusate sodium 100 MG capsule Commonly known as: COLACE Take 100 mg by mouth 2 (two) times daily.   furosemide 20 MG tablet Commonly known as: Lasix Take 1 tablet (20 mg total) by mouth daily.   hydroxyurea 500 MG capsule Commonly known as: HYDREA Take 1 capsule (500 mg total) by mouth every other day. May take with food to minimize GI side  effects.   lactose free nutrition Liqd Take 237 mLs by mouth See admin instructions. Every shift   levETIRAcetam 250 MG tablet Commonly known as: Keppra Take 1 tablet (250 mg total) by mouth 2 (two) times daily. What changed:   medication strength  how much to take Changed by: Kathlene November, MD   Lidocaine 4 % Ptch LIDOCAINE 4% PATCH :Apply 1 patch topically daily and remove after 12 hours to left lateral chest What changed:   how much to take  how to take this  when to take this  additional instructions   LORazepam 0.5 MG tablet Commonly known as: ATIVAN TAKE 1/2 TABLET BY MOUTH EVERY MORNING AND EVERY  AFTERNOON THEN TAKE 2 TABLETS BY MOUTH AT BEDTIME AS NEEDED   metoprolol tartrate 50 MG tablet Commonly known as: LOPRESSOR Take 1 tablet (50 mg total) by mouth 2 (two) times daily.   multivitamin with minerals Tabs tablet Take 1 tablet by mouth daily.   nitroGLYCERIN 0.4 MG SL tablet Commonly known as: NITROSTAT Place 1 tablet (0.4 mg total) under the tongue every 5 (five) minutes x 3 doses as needed for chest pain.   omeprazole 20 MG capsule Commonly known as: PRILOSEC Take 1 capsule (20 mg total) by mouth daily.   ondansetron 4 MG tablet Commonly known as: ZOFRAN Take 1 tablet (4 mg total) by mouth every 6 (six) hours as needed for nausea or vomiting.   polyethylene glycol 17 g packet Commonly known as: MIRALAX / GLYCOLAX Take 17 g by mouth daily.   tiZANidine 2 MG tablet Commonly known as: ZANAFLEX Take 2 mg by mouth every 6 (six) hours as needed for muscle spasms.   valACYclovir 500 MG tablet Commonly known as: VALTREX TAKE 2 TABLETS BY MOUTH AT ONSET OF RASH AND 2 TABLETS 12 HOURS LATER AS NEEDED          Objective:   Physical Exam BP (!) 152/82 (BP Location: Right Arm, Patient Position: Sitting, Cuff Size: Large)   Pulse 71   Temp (!) 97 F (36.1 C) (Oral)   Ht 5\' 6"  (1.676 m)   Wt 121 lb (54.9 kg)   SpO2 95%   BMI 19.53 kg/m  General:    Well developed, NAD, BMI noted. HEENT:  Normocephalic . Face symmetric, atraumatic Lungs:  CTA B Normal respiratory effort, no intercostal retractions, no accessory muscle use. Heart: RRR,  no murmur.  Lower extremities: No edema, good pedal pulses, capillary refill is slightly slow, she does not need some discoloration, purplish color.  See picture. Skin: Not pale. Not jaundice Neurologic:  alert & oriented X3.  Speech normal  Psych--  Cognition and judgment appear intact.  Cooperative with normal attention span and concentration.  Behavior appropriate. No anxious or depressed appearing.        Assessment      Assessment  HTN Dyslipidemia: Intolerant to most medicines including Zetia, Pravachol, Niaspan Depression, anxiety , insomnia - on ativan bid (03-2015 citalopram cause drowsiness, 04-2015 prozac:diarrhea) Hem-Onc: POLYCYTEMIA: sees hem-onc H/o breast ca  MSK: ---DJD :  Tylenol #3, uses rarely ---Osteoporosis:  T score -2.8 (02-2013): declined treatment 06-2017 ---L2 fractures, s/p vertebral plasty 2009 Migraine headaches - Tylenol #3 (rarely)  GI: GERD- intolerant to prevacid, visual disturbances HH, diverticulitis, Colon polyps  h/o "functional " LLQ abd pain CV: ---Cardiomyopathy Probable Takotsubo cardiomyopathy: CP w/ normal cath in 1994.   Severe CP  2003 with widespread T wave inversions on ECG.  Cath with minimal coronary disease but LV-gram showed periapical severe hypokinesis and basilar hyperkinesia (EF 40%) >>> ECHO 4/09 confirmed full LV functional recovery with EF 60%, mild LVH. 2012: Nl EF per echo, (-) Lexiscan 05/21/18 ECHO Nl/stable ---Stroke / TIA 2012 ---ICB seizure 10/2019 was rx keppra, self d/c 05/2019  Sleep apnea 2012, , not on CPAP H/oDVT, right leg after venous ablation Raynaud phenomena, DX 05/2018  PLAN Frequent falls: No further falls Anxiety: See last visit, on Ativan. Osteoporosis: Apparently not taking Fosamax.  We will send a  refill.  See comments under "social, compliance" Weight loss: No further weight loss, TSH okay. Chest pain, edema: Reports no further chest pain, I meant to refer her to  cardiology but I did not.  Patient is not really interested.  Refill Lasix, observation. History of ICB: Was Rx Keppra, reports diarrhea with it, no plans to see neurology, decrease dose  to 250 mg twice daily, see if she can tolerate. Social, compliance: Lives by herself.  Has 3 daughters, unfortunately one of her daughters died. Her daughter Early Chars lives in town but they are not in good terms (per pt).  She does have a caregiver that helps her with shopping but not with medication management. Was advised to come back for nurse visit with all her medications and will try to help her that way.  We will also send home health Raynaud phenomena: Complain of discoloration on her feet, see picture.  She has good pulses, capillary refill is slightly low, no claudication.  Rec observation. RTC for a nurse visit next week RTC 3 months to see me    This visit occurred during the SARS-CoV-2 public health emergency.  Safety protocols were in place, including screening questions prior to the visit, additional usage of staff PPE, and extensive cleaning of exam room while observing appropriate contact time as indicated for disinfecting solutions.

## 2020-06-09 DIAGNOSIS — I5181 Takotsubo syndrome: Secondary | ICD-10-CM | POA: Diagnosis not present

## 2020-06-09 DIAGNOSIS — F411 Generalized anxiety disorder: Secondary | ICD-10-CM | POA: Diagnosis not present

## 2020-06-09 DIAGNOSIS — M810 Age-related osteoporosis without current pathological fracture: Secondary | ICD-10-CM | POA: Diagnosis not present

## 2020-06-09 DIAGNOSIS — I110 Hypertensive heart disease with heart failure: Secondary | ICD-10-CM | POA: Diagnosis not present

## 2020-06-09 DIAGNOSIS — R296 Repeated falls: Secondary | ICD-10-CM | POA: Diagnosis not present

## 2020-06-09 DIAGNOSIS — F32A Depression, unspecified: Secondary | ICD-10-CM | POA: Diagnosis not present

## 2020-06-09 DIAGNOSIS — Z8673 Personal history of transient ischemic attack (TIA), and cerebral infarction without residual deficits: Secondary | ICD-10-CM | POA: Diagnosis not present

## 2020-06-09 DIAGNOSIS — G43909 Migraine, unspecified, not intractable, without status migrainosus: Secondary | ICD-10-CM | POA: Diagnosis not present

## 2020-06-09 DIAGNOSIS — G4733 Obstructive sleep apnea (adult) (pediatric): Secondary | ICD-10-CM | POA: Diagnosis not present

## 2020-06-09 DIAGNOSIS — I2510 Atherosclerotic heart disease of native coronary artery without angina pectoris: Secondary | ICD-10-CM | POA: Diagnosis not present

## 2020-06-09 DIAGNOSIS — I5032 Chronic diastolic (congestive) heart failure: Secondary | ICD-10-CM | POA: Diagnosis not present

## 2020-06-09 DIAGNOSIS — R269 Unspecified abnormalities of gait and mobility: Secondary | ICD-10-CM | POA: Diagnosis not present

## 2020-06-09 NOTE — Assessment & Plan Note (Signed)
Frequent falls: No further falls Anxiety: See last visit, on Ativan. Osteoporosis: Apparently not taking Fosamax.  We will send a refill.  See comments under "social, compliance" Weight loss: No further weight loss, TSH okay. Chest pain, edema: Reports no further chest pain, I meant to refer her to cardiology but I did not.  Patient is not really interested.  Refill Lasix, observation. History of ICB: Was Rx Keppra, reports diarrhea with it, no plans to see neurology, decrease dose  to 250 mg twice daily, see if she can tolerate. Social, compliance: Lives by herself.  Has 3 daughters, unfortunately one of her daughters died. Her daughter Early Chars lives in town but they are not in good terms (per pt).  She does have a caregiver that helps her with shopping but not with medication management. Was advised to come back for nurse visit with all her medications and will try to help her that way.  We will also send home health Raynaud phenomena: Complain of discoloration on her feet, see picture.  She has good pulses, capillary refill is slightly low, no claudication.  Rec observation. RTC for a nurse visit next week RTC 3 months to see me

## 2020-06-21 ENCOUNTER — Ambulatory Visit: Payer: Medicare Other

## 2020-06-22 ENCOUNTER — Telehealth: Payer: Self-pay

## 2020-06-22 DIAGNOSIS — Z8673 Personal history of transient ischemic attack (TIA), and cerebral infarction without residual deficits: Secondary | ICD-10-CM

## 2020-06-22 DIAGNOSIS — Z86718 Personal history of other venous thrombosis and embolism: Secondary | ICD-10-CM

## 2020-06-22 DIAGNOSIS — K5790 Diverticulosis of intestine, part unspecified, without perforation or abscess without bleeding: Secondary | ICD-10-CM | POA: Diagnosis not present

## 2020-06-22 DIAGNOSIS — I110 Hypertensive heart disease with heart failure: Secondary | ICD-10-CM | POA: Diagnosis not present

## 2020-06-22 DIAGNOSIS — F419 Anxiety disorder, unspecified: Secondary | ICD-10-CM | POA: Diagnosis not present

## 2020-06-22 DIAGNOSIS — R269 Unspecified abnormalities of gait and mobility: Secondary | ICD-10-CM | POA: Diagnosis not present

## 2020-06-22 DIAGNOSIS — I2510 Atherosclerotic heart disease of native coronary artery without angina pectoris: Secondary | ICD-10-CM | POA: Diagnosis not present

## 2020-06-22 DIAGNOSIS — G43909 Migraine, unspecified, not intractable, without status migrainosus: Secondary | ICD-10-CM

## 2020-06-22 DIAGNOSIS — K219 Gastro-esophageal reflux disease without esophagitis: Secondary | ICD-10-CM

## 2020-06-22 DIAGNOSIS — D500 Iron deficiency anemia secondary to blood loss (chronic): Secondary | ICD-10-CM | POA: Diagnosis not present

## 2020-06-22 DIAGNOSIS — R296 Repeated falls: Secondary | ICD-10-CM | POA: Diagnosis not present

## 2020-06-22 DIAGNOSIS — I5181 Takotsubo syndrome: Secondary | ICD-10-CM | POA: Diagnosis not present

## 2020-06-22 DIAGNOSIS — I5032 Chronic diastolic (congestive) heart failure: Secondary | ICD-10-CM | POA: Diagnosis not present

## 2020-06-22 DIAGNOSIS — M810 Age-related osteoporosis without current pathological fracture: Secondary | ICD-10-CM

## 2020-06-22 DIAGNOSIS — K649 Unspecified hemorrhoids: Secondary | ICD-10-CM | POA: Diagnosis not present

## 2020-06-22 DIAGNOSIS — F32A Depression, unspecified: Secondary | ICD-10-CM | POA: Diagnosis not present

## 2020-06-22 DIAGNOSIS — G4733 Obstructive sleep apnea (adult) (pediatric): Secondary | ICD-10-CM | POA: Diagnosis not present

## 2020-06-22 NOTE — Telephone Encounter (Signed)
Received fax confirmation

## 2020-06-22 NOTE — Telephone Encounter (Signed)
Plan of care from [facility] signed and faxed back to [phone]. Form sent for scanning.

## 2020-06-23 DIAGNOSIS — I2510 Atherosclerotic heart disease of native coronary artery without angina pectoris: Secondary | ICD-10-CM | POA: Diagnosis not present

## 2020-06-23 DIAGNOSIS — I5032 Chronic diastolic (congestive) heart failure: Secondary | ICD-10-CM | POA: Diagnosis not present

## 2020-06-23 DIAGNOSIS — I5181 Takotsubo syndrome: Secondary | ICD-10-CM | POA: Diagnosis not present

## 2020-06-23 DIAGNOSIS — I110 Hypertensive heart disease with heart failure: Secondary | ICD-10-CM | POA: Diagnosis not present

## 2020-06-23 DIAGNOSIS — R269 Unspecified abnormalities of gait and mobility: Secondary | ICD-10-CM | POA: Diagnosis not present

## 2020-06-23 DIAGNOSIS — R296 Repeated falls: Secondary | ICD-10-CM | POA: Diagnosis not present

## 2020-06-24 ENCOUNTER — Telehealth: Payer: Self-pay | Admitting: Internal Medicine

## 2020-06-24 NOTE — Telephone Encounter (Signed)
Spoke w/ Jinny Blossom- verbal orders given.

## 2020-06-24 NOTE — Telephone Encounter (Signed)
FYI

## 2020-06-24 NOTE — Telephone Encounter (Signed)
Caller : Megan  Call Back @ [phone] vm per Jinny Blossom    Per Jinny Blossom, Patient fell last Friday , patient just notified of fall today and would like to report. Patient is complaining of left sided rib pain .  Jinny Blossom, would like to request verbal orders for OT,PT, and a Social Work consultation.  Please Advise

## 2020-06-30 DIAGNOSIS — I2510 Atherosclerotic heart disease of native coronary artery without angina pectoris: Secondary | ICD-10-CM | POA: Diagnosis not present

## 2020-06-30 DIAGNOSIS — I110 Hypertensive heart disease with heart failure: Secondary | ICD-10-CM | POA: Diagnosis not present

## 2020-06-30 DIAGNOSIS — R269 Unspecified abnormalities of gait and mobility: Secondary | ICD-10-CM | POA: Diagnosis not present

## 2020-06-30 DIAGNOSIS — R296 Repeated falls: Secondary | ICD-10-CM | POA: Diagnosis not present

## 2020-06-30 DIAGNOSIS — I5181 Takotsubo syndrome: Secondary | ICD-10-CM | POA: Diagnosis not present

## 2020-06-30 DIAGNOSIS — I5032 Chronic diastolic (congestive) heart failure: Secondary | ICD-10-CM | POA: Diagnosis not present

## 2020-07-01 DIAGNOSIS — R296 Repeated falls: Secondary | ICD-10-CM | POA: Diagnosis not present

## 2020-07-01 DIAGNOSIS — I5032 Chronic diastolic (congestive) heart failure: Secondary | ICD-10-CM | POA: Diagnosis not present

## 2020-07-01 DIAGNOSIS — I110 Hypertensive heart disease with heart failure: Secondary | ICD-10-CM | POA: Diagnosis not present

## 2020-07-01 DIAGNOSIS — I2510 Atherosclerotic heart disease of native coronary artery without angina pectoris: Secondary | ICD-10-CM | POA: Diagnosis not present

## 2020-07-01 DIAGNOSIS — I5181 Takotsubo syndrome: Secondary | ICD-10-CM | POA: Diagnosis not present

## 2020-07-01 DIAGNOSIS — R269 Unspecified abnormalities of gait and mobility: Secondary | ICD-10-CM | POA: Diagnosis not present

## 2020-07-04 ENCOUNTER — Telehealth: Payer: Self-pay

## 2020-07-04 ENCOUNTER — Inpatient Hospital Stay: Payer: Medicare Other | Admitting: Family

## 2020-07-04 ENCOUNTER — Encounter (HOSPITAL_COMMUNITY): Payer: Self-pay | Admitting: Emergency Medicine

## 2020-07-04 ENCOUNTER — Inpatient Hospital Stay: Payer: Medicare Other

## 2020-07-04 ENCOUNTER — Emergency Department (HOSPITAL_COMMUNITY): Payer: Medicare Other

## 2020-07-04 ENCOUNTER — Emergency Department (HOSPITAL_COMMUNITY)
Admission: EM | Admit: 2020-07-04 | Discharge: 2020-07-04 | Disposition: A | Discharge status: Home / Self Care | Payer: Medicare Other | Attending: Emergency Medicine | Admitting: Emergency Medicine

## 2020-07-04 DIAGNOSIS — Z96652 Presence of left artificial knee joint: Secondary | ICD-10-CM | POA: Insufficient documentation

## 2020-07-04 DIAGNOSIS — I110 Hypertensive heart disease with heart failure: Secondary | ICD-10-CM | POA: Insufficient documentation

## 2020-07-04 DIAGNOSIS — S24109A Unspecified injury at unspecified level of thoracic spinal cord, initial encounter: Secondary | ICD-10-CM | POA: Diagnosis present

## 2020-07-04 DIAGNOSIS — Z853 Personal history of malignant neoplasm of breast: Secondary | ICD-10-CM | POA: Diagnosis not present

## 2020-07-04 DIAGNOSIS — Z7982 Long term (current) use of aspirin: Secondary | ICD-10-CM | POA: Insufficient documentation

## 2020-07-04 DIAGNOSIS — I10 Essential (primary) hypertension: Secondary | ICD-10-CM | POA: Diagnosis not present

## 2020-07-04 DIAGNOSIS — R52 Pain, unspecified: Secondary | ICD-10-CM | POA: Diagnosis not present

## 2020-07-04 DIAGNOSIS — Z7901 Long term (current) use of anticoagulants: Secondary | ICD-10-CM | POA: Diagnosis not present

## 2020-07-04 DIAGNOSIS — Z87891 Personal history of nicotine dependence: Secondary | ICD-10-CM | POA: Diagnosis not present

## 2020-07-04 DIAGNOSIS — I2510 Atherosclerotic heart disease of native coronary artery without angina pectoris: Secondary | ICD-10-CM | POA: Insufficient documentation

## 2020-07-04 DIAGNOSIS — I5032 Chronic diastolic (congestive) heart failure: Secondary | ICD-10-CM | POA: Diagnosis not present

## 2020-07-04 DIAGNOSIS — S22070A Wedge compression fracture of T9-T10 vertebra, initial encounter for closed fracture: Secondary | ICD-10-CM | POA: Diagnosis not present

## 2020-07-04 DIAGNOSIS — M549 Dorsalgia, unspecified: Secondary | ICD-10-CM | POA: Diagnosis not present

## 2020-07-04 DIAGNOSIS — M545 Low back pain, unspecified: Secondary | ICD-10-CM | POA: Diagnosis not present

## 2020-07-04 DIAGNOSIS — Z79899 Other long term (current) drug therapy: Secondary | ICD-10-CM | POA: Diagnosis not present

## 2020-07-04 DIAGNOSIS — X58XXXA Exposure to other specified factors, initial encounter: Secondary | ICD-10-CM | POA: Diagnosis not present

## 2020-07-04 LAB — BASIC METABOLIC PANEL
Anion gap: 9 (ref 5–15)
BUN: 8 mg/dL (ref 8–23)
CO2: 29 mmol/L (ref 22–32)
Calcium: 9.6 mg/dL (ref 8.9–10.3)
Chloride: 97 mmol/L — ABNORMAL LOW (ref 98–111)
Creatinine, Ser: 0.64 mg/dL (ref 0.44–1.00)
GFR, Estimated: >60 mL/min (ref >60)
Glucose, Bld: 104 mg/dL — ABNORMAL HIGH (ref 70–99)
Potassium: 4.3 mmol/L (ref 3.5–5.1)
Sodium: 135 mmol/L (ref 135–145)

## 2020-07-04 MED ORDER — CYCLOBENZAPRINE HCL 5 MG PO TABS: 5.0000 mg | ORAL_TABLET | Freq: Three times a day (TID) | ORAL | 0 refills | Status: DC | PRN

## 2020-07-04 MED ORDER — CYCLOBENZAPRINE HCL 10 MG PO TABS
5.0000 mg | ORAL_TABLET | Freq: Once | ORAL | Status: AC
  Administered 2020-07-04: 5 mg via ORAL
  Filled 2020-07-04: qty 1

## 2020-07-04 NOTE — Telephone Encounter (Signed)
S/w pt as she had left a vm for the desk nurse to r/s todays appts due to having had hip surg.  We have moved her appts out two weeks in hopes of her feeling better   [name]

## 2020-07-04 NOTE — ED Provider Notes (Signed)
[facility] EMERGENCY DEPARTMENT Provider Note   CSN: [id number] Arrival date & time: 07/04/20  1524     History Chief Complaint  Patient presents with  . Back Pain    [name] is a 87 y.o. female.  HPI 87 year old female presents today complaining of mid back pain.  States that this began on Friday after she had had physical therapy.  She describes it is in the middle of her back.  It is radiating into the back area but not around to her abdomen or into her lower extremities.  She describes the pain as similar to her prior compression fractures.  She denies fall or direct trauma.  She has been taking her usual home medications without relief.  She describes the pain as severe.  She was given fentanyl 150 mics prehospital by EMS and continues to complain of mid back pain.  She has pain with movement but is not having any weakness in her lower extremities.  She reports that she is currently living alone in her own home.    Past Medical History:  Diagnosis Date  . CAD (coronary artery disease)    mild nonobstructive disease on cath in 2003  . Cancer ([place])   . Cardiomyopathy    Probable Takotsubo, severe CP w/ normal cath in 1994. Severe CP in 2003 w/ widespread T wave inversions on ECG. Cath w/ minimal coronary disease but LV-gram showed periapical severe hypokinesis and basilar hyperkinesia (EF 40%). Last echo in 4/09 confirmed full LV functional recovery with EF 60%, no regional wall motion abnormalities, mild to moderate LVH.  Marland Kitchen Chronic diastolic CHF (congestive heart failure) ([place]) 09/13/2019  . CVA (cerebral infarction)   . Depression with anxiety 03-29-11   lost husband 3'09  . Diverticulitis   . DVT (deep venous thrombosis) ([place])    after venous ablation, R leg  . Dyspnea   . E. coli gastroenteritis 03-29-11   8'10  . Fracture 09/10/07   L2, status post vertebroplasty of L2 performed by IR  . GERD (gastroesophageal reflux disease)   .  Headache(784.0)    migraines  . Hemorrhoids   . Hiatal hernia 03-29-11   no nerve problems  . Hyperlipidemia   . Hyperplastic colon polyp 06/2001  . Hypertension   . Iron deficiency anemia due to chronic blood loss 12/30/2017  . OA (osteoarthritis) of knee 03-29-11   w/ bilateral knee pain-not a problem now  . OSA (obstructive sleep apnea) 03-29-11   no cpap used, not a problem now.  . Osteoporosis   . Otalgia of both ears    Dr. Simeon Craft  . Polycythemia   . Stroke [facility]) 03-29-11   CVA x2 -last 10'12-?TIA(visual problems)  . Varicose vein     Patient Active Problem List   Diagnosis Date Noted  . Acute midline thoracic back pain 03/04/2020  . Malnutrition of moderate degree 12/27/2019  . Pressure injury of skin 12/27/2019  . S/p left hip fracture   . Comminuted fracture of hip, left, open type I or II, initial encounter ([place]) 12/24/2019  . Closed left hip fracture ([place]) 12/24/2019  . ICH (intracerebral hemorrhage) ([place]) 11/04/2019  . Intractable back pain 09/13/2019  . Compression fracture of T9 vertebra ([place]) 09/13/2019  . Chronic diastolic CHF (congestive heart failure) ([place]) 09/13/2019  . Overweight (BMI 25.0-29.9) 09/13/2019  . Constipation 09/13/2019  . Raynaud's phenomenon without gangrene 05/19/2018  . Iron deficiency anemia due to chronic blood loss 12/30/2017  . Insomnia  06/25/2017  . Mild CAD 05/24/2017  . PCP NOTES >>> 02/15/2015  . Superficial thrombophlebitis 04/06/2014  . Annual physical exam 08/18/2012  . Edema 04/14/2012  . Breast cancer ([place]) 11/05/2011  . Polycythemia 11/05/2011  . Chest pain 01/16/2011  . TIA (transient ischemic attack) 01/16/2011  . CARDIOMYOPATHY 08/03/2009  . DJD -- pain mgmt 08/16/2008  . ABDOMINAL PAIN, LEFT LOWER QUADRANT 07/05/2008  . GAIT DISTURBANCE 10/21/2007  . ANXIETY DEPRESSION 10/08/2007  . Hyperlipidemia 06/25/2007  . Takotsubo cardiomyopathy 06/25/2007  . GERD without esophagitis 06/25/2007  . Essential hypertension  09/30/2006  . Senile osteoporosis 09/30/2006  . Migraine with aura 09/30/2006    Past Surgical History:  Procedure Laterality Date  . APPENDECTOMY    . BACK SURGERY    . BREAST BIOPSY    . BREAST CYST EXCISION    . BREAST LUMPECTOMY Left 2013   left stage I left breast cancer  . CHOLECYSTECTOMY  04/09/2011   Procedure: LAPAROSCOPIC CHOLECYSTECTOMY WITH INTRAOPERATIVE CHOLANGIOGRAM;  Surgeon: Odis Hollingshead, MD;  Location: [facility];  Service: General;  Laterality: N/A;  . Dental Extraction     L maxillary molar  . INTRAMEDULLARY (IM) NAIL INTERTROCHANTERIC Left 12/25/2019   Procedure: INTRAMEDULLARY (IM) NAIL INTERTROCHANTRIC;  Surgeon: Paralee Cancel, MD;  Location: [facility];  Service: Orthopedics;  Laterality: Left;  . IR KYPHO THORACIC WITH BONE BIOPSY  09/15/2019  . IR VERTEBROPLASTY CERV/THOR BX INC UNI/BIL INC/INJECT/IMAGING  11/10/2019  . KYPHOSIS SURGERY  08/2007   cement used  . OVARIAN CYST SURGERY     left  . RADIOLOGY WITH ANESTHESIA N/A 11/10/2019   Procedure: VERTEBROPLASTY;  Surgeon: Luanne Bras, MD;  Location: [facility];  Service: Radiology;  Laterality: N/A;  . TONSILLECTOMY    . TOTAL KNEE ARTHROPLASTY  06/2010   left  . TUBAL LIGATION       OB History    Gravida  3   Para  3   Term      Preterm      AB      Living  3     SAB      IAB      Ectopic      Multiple      Live Births              Family History  Problem Relation Age of Onset  . Breast cancer Mother   . Heart disease Father        MIs  . Colon cancer Other        cousin  . Breast cancer Sister   . Leukemia Brother        GM    Social History   Tobacco Use  . Smoking status: Former Smoker    Packs/day: 1.00    Years: 37.00    Pack years: 37.00    Types: Cigarettes    Start date: 06/26/1949    Quit date: 05/14/1988    Years since quitting: 32.1  . Smokeless tobacco: Never Used  . Tobacco comment: quit 25 years ago  Vaping Use  . Vaping Use: Never used  Substance  Use Topics  . Alcohol use: Yes    Alcohol/week: 1.0 standard drink    Types: 1 Glasses of wine per week    Comment: occasional/social  . Drug use: No    Home Medications Prior to Admission medications   Medication Sig Start Date End Date Taking? Authorizing Provider  acetaminophen (TYLENOL) 500 MG tablet Take 1,000  mg by mouth every 8 (eight) hours as needed for mild pain or moderate pain. NTE 6 tabs/24 hours    [provider]  alendronate (FOSAMAX) 70 MG tablet Take 1 tablet (70 mg total) by mouth every 7 (seven) days. Take with a full glass of water on an empty stomach. 06/08/20   Colon Branch, MD  aspirin 81 MG EC tablet Take 1 tablet (81 mg total) by mouth daily. Swallow whole. 02/22/20   Colon Branch, MD  bisacodyl (DULCOLAX) 10 MG suppository Place 1 suppository (10 mg total) rectally daily as needed for moderate constipation. 09/17/19   [name], Corrie Mckusick, MD  bismuth subsalicylate (PEPTO BISMOL) 262 MG/15ML suspension Take 30 mLs by mouth every 4 (four) hours as needed for indigestion (heartburn).     [provider]  docusate sodium (COLACE) 100 MG capsule Take 100 mg by mouth 2 (two) times daily.     [provider]  furosemide (LASIX) 20 MG tablet Take 1 tablet (20 mg total) by mouth daily. 06/08/20   Colon Branch, MD  hydroxyurea (HYDREA) 500 MG capsule Take 1 capsule (500 mg total) by mouth every other day. May take with food to minimize GI side effects. 04/14/20   [place], Holli Humbles, NP  Infant Care Products (DERMACLOUD) CREA Apply 1 application topically See admin instructions. Apply to buttocks topically every shift for rash 12/11/19   Colon Branch, MD  lactose free nutrition (BOOST) LIQD Take 237 mLs by mouth See admin instructions. Every shift    [provider]  levETIRAcetam (KEPPRA) 250 MG tablet Take 1 tablet (250 mg total) by mouth 2 (two) times daily. 06/08/20   Colon Branch, MD  Lidocaine 4 % PTCH LIDOCAINE 4% PATCH :Apply 1 patch topically daily  and remove after 12 hours to left lateral chest Patient taking differently: Apply 1 patch topically daily. Apply to left lateral chest. Remove after 12 hours. 11/12/19   Rai, Ripudeep K, MD  LORazepam (ATIVAN) 0.5 MG tablet TAKE 1/2 TABLET BY MOUTH EVERY MORNING AND EVERY AFTERNOON THEN TAKE 2 TABLETS BY MOUTH AT BEDTIME AS NEEDED 05/17/20   Colon Branch, MD  metoprolol tartrate (LOPRESSOR) 50 MG tablet Take 1 tablet (50 mg total) by mouth 2 (two) times daily. 10/09/19   Colon Branch, MD  Multiple Vitamin (MULTIVITAMIN WITH MINERALS) TABS tablet Take 1 tablet by mouth daily. 12/29/19   Georgette Shell, MD  nitroGLYCERIN (NITROSTAT) 0.4 MG SL tablet Place 1 tablet (0.4 mg total) under the tongue every 5 (five) minutes x 3 doses as needed for chest pain. 04/22/20   Colon Branch, MD  omeprazole (PRILOSEC) 20 MG capsule Take 1 capsule (20 mg total) by mouth daily. 10/05/19   [place] Lewis C, PA-C  ondansetron (ZOFRAN) 4 MG tablet Take 1 tablet (4 mg total) by mouth every 6 (six) hours as needed for nausea or vomiting. 10/05/19   [place] Lewis C, PA-C  polyethylene glycol (MIRALAX / GLYCOLAX) 17 g packet Take 17 g by mouth daily.    [provider]  tiZANidine (ZANAFLEX) 2 MG tablet Take 2 mg by mouth every 6 (six) hours as needed for muscle spasms.    [provider]  valACYclovir (VALTREX) 500 MG tablet TAKE 2 TABLETS BY MOUTH AT ONSET OF RASH AND 2 TABLETS 12 HOURS LATER AS NEEDED 04/29/20   [place], Holli Humbles, NP    Allergies    Penicillins, Valsartan, Atorvastatin, Carvedilol, Ezetimibe, Fluvastatin sodium, Magnesium hydroxide,  Meloxicam, Pneumovax [pneumococcal polysaccharide vaccine], Quinapril hcl, Simvastatin, Topamax [topiramate], and Vit d-vit e-safflower oil  Review of Systems   Review of Systems  All other systems reviewed and are negative.   Physical Exam Updated Vital Signs BP (!) 164/85 (BP Location: Right Arm)   Pulse 82   Temp 98.2 F (36.8 C) (Oral)   Resp 16    SpO2 100%   Physical Exam Vitals and nursing note reviewed.  Constitutional:      Appearance: Normal appearance.  HENT:     Right Ear: External ear normal.     Left Ear: External ear normal.     Nose: Nose normal.     Mouth/Throat:     Pharynx: Oropharynx is clear.  Eyes:     Pupils: Pupils are equal, round, and reactive to light.  Cardiovascular:     Rate and Rhythm: Normal rate and regular rhythm.     Pulses: Normal pulses.  Pulmonary:     Effort: Pulmonary effort is normal.     Breath sounds: Normal breath sounds.  Abdominal:     General: Abdomen is flat.  Musculoskeletal:        General: Normal range of motion.     Cervical back: Normal range of motion.     Comments: Mid lower thoracic to upper lumbar tenderness to palpation Strength is equal bilateral lower extremities Pulses are intact bilaterally  Skin:    General: Skin is warm.     Capillary Refill: Capillary refill takes less than 2 seconds.  Neurological:     General: No focal deficit present.     Mental Status: She is alert.     Motor: No weakness.     Coordination: Coordination normal.     Deep Tendon Reflexes: Reflexes normal.  Psychiatric:        Mood and Affect: Mood normal.     ED Results / Procedures / Treatments   Labs (all labs ordered are listed, but only abnormal results are displayed) Labs Reviewed  CBC WITH DIFFERENTIAL/PLATELET  BASIC METABOLIC PANEL    EKG None  Radiology No results found.  Procedures Procedures   Medications Ordered in ED Medications - No data to display  ED Course  I have reviewed the triage vital signs and the nursing notes.  Pertinent labs & imaging results that were available during my care of the patient were reviewed by me and considered in my medical decision making (see chart for details).    MDM Rules/Calculators/A&P                         Patient feels improved and wishes to be discharged.  She is given information regarding IR clinic to call  tomorrow.  Her grandson works here in the hospital and I discussed her care with him. Final Clinical Impression(s) / ED Diagnoses Final diagnoses:  Compression fracture of T9 vertebra, initial encounter [facility])    Rx / [place] Orders ED Discharge Orders    None       Pattricia Boss, MD 07/04/20 2301

## 2020-07-04 NOTE — ED Triage Notes (Signed)
Pt arrives from home via [facility] from home hx of chronic back pain here for low back pain hx of "slipped disc"  Ems gave 160mcg Fentanyl given IV 20G left a/c placed by ems.

## 2020-07-04 NOTE — Discharge Instructions (Signed)
You are given skeletal muscle relaxants or pain. Please call the interventional radiology department tomorrow to schedule an appointment for possible intervention

## 2020-07-06 ENCOUNTER — Telehealth: Payer: Self-pay | Admitting: Internal Medicine

## 2020-07-06 DIAGNOSIS — S22070A Wedge compression fracture of T9-T10 vertebra, initial encounter for closed fracture: Secondary | ICD-10-CM

## 2020-07-06 NOTE — Telephone Encounter (Signed)
Spoke w/ Pt- she called number provided to her by ED and she was informed that they didn't have an IR dept there. Informed I'd send a referral to them for her to try to get her seen. She informed that ED sent in pain medicine to pharmacy and was just informed by [facility] that it was ready for pick up, she is leaving now to go get it.

## 2020-07-06 NOTE — Telephone Encounter (Signed)
Please advise 

## 2020-07-06 NOTE — Telephone Encounter (Signed)
Caller: [name]  Call Back @ [phone]  Patient states she has went to ED for back pain. Patient states the ED visit did not help her. Patient would like to know where she can go to get help with her compressed back.

## 2020-07-06 NOTE — Telephone Encounter (Signed)
ER note reviewed, she was recommended to call interventional radiology to set up a vertebroplasty, that definitely will help the pain. Other than that recommend Tylenol, we could also do calcitonin, a nose spray, I could send a prescription if so desired, cost may be an issue.

## 2020-07-08 NOTE — Telephone Encounter (Signed)
Patient called this morning states she was in horrible pain in her back and hips. Patient is requesting a pain medication sent into the pharmacy , patient states she has not way to get here to see Dr Larose Kells   Please advise

## 2020-07-08 NOTE — Telephone Encounter (Signed)
Please advise 

## 2020-07-08 NOTE — Telephone Encounter (Signed)
Spoke w/ Pt- informed of attempts to get her seen by IR. Recommended that if in severe pain she should go to ED. She will have family member take her to [facility].

## 2020-07-08 NOTE — Telephone Encounter (Signed)
Left message for rescheduling out interventional radiology [phone]) Explained the situation and asked for a prompt evaluation    Please let the patient know of above

## 2020-07-08 NOTE — Telephone Encounter (Addendum)
IR office referral coordinator unsure what is needed. Dr. Larose Kells- can you try reaching out to IR please? They can be reached at [phone].

## 2020-07-11 ENCOUNTER — Encounter (HOSPITAL_COMMUNITY): Payer: Self-pay

## 2020-07-11 ENCOUNTER — Other Ambulatory Visit (HOSPITAL_COMMUNITY): Payer: Self-pay | Admitting: Internal Medicine

## 2020-07-11 ENCOUNTER — Emergency Department (HOSPITAL_COMMUNITY)
Admission: EM | Admit: 2020-07-11 | Discharge: 2020-07-11 | Disposition: A | Discharge status: Home / Self Care | Payer: Medicare Other | Attending: Emergency Medicine | Admitting: Emergency Medicine

## 2020-07-11 ENCOUNTER — Telehealth (HOSPITAL_COMMUNITY): Payer: Self-pay | Admitting: Emergency Medicine

## 2020-07-11 ENCOUNTER — Other Ambulatory Visit (HOSPITAL_COMMUNITY): Payer: Self-pay | Admitting: Neuroradiology

## 2020-07-11 ENCOUNTER — Telehealth (HOSPITAL_COMMUNITY): Payer: Self-pay

## 2020-07-11 DIAGNOSIS — Z7982 Long term (current) use of aspirin: Secondary | ICD-10-CM | POA: Insufficient documentation

## 2020-07-11 DIAGNOSIS — Z96652 Presence of left artificial knee joint: Secondary | ICD-10-CM | POA: Diagnosis not present

## 2020-07-11 DIAGNOSIS — M545 Low back pain, unspecified: Secondary | ICD-10-CM | POA: Diagnosis not present

## 2020-07-11 DIAGNOSIS — X58XXXD Exposure to other specified factors, subsequent encounter: Secondary | ICD-10-CM | POA: Diagnosis not present

## 2020-07-11 DIAGNOSIS — Z79899 Other long term (current) drug therapy: Secondary | ICD-10-CM | POA: Insufficient documentation

## 2020-07-11 DIAGNOSIS — I2510 Atherosclerotic heart disease of native coronary artery without angina pectoris: Secondary | ICD-10-CM | POA: Insufficient documentation

## 2020-07-11 DIAGNOSIS — I5032 Chronic diastolic (congestive) heart failure: Secondary | ICD-10-CM | POA: Insufficient documentation

## 2020-07-11 DIAGNOSIS — M549 Dorsalgia, unspecified: Secondary | ICD-10-CM | POA: Diagnosis not present

## 2020-07-11 DIAGNOSIS — Z87891 Personal history of nicotine dependence: Secondary | ICD-10-CM | POA: Insufficient documentation

## 2020-07-11 DIAGNOSIS — S3992XD Unspecified injury of lower back, subsequent encounter: Secondary | ICD-10-CM | POA: Diagnosis present

## 2020-07-11 DIAGNOSIS — Z853 Personal history of malignant neoplasm of breast: Secondary | ICD-10-CM | POA: Diagnosis not present

## 2020-07-11 DIAGNOSIS — S22079D Unspecified fracture of T9-T10 vertebra, subsequent encounter for fracture with routine healing: Secondary | ICD-10-CM | POA: Diagnosis not present

## 2020-07-11 DIAGNOSIS — S22060A Wedge compression fracture of T7-T8 vertebra, initial encounter for closed fracture: Secondary | ICD-10-CM

## 2020-07-11 DIAGNOSIS — I110 Hypertensive heart disease with heart failure: Secondary | ICD-10-CM | POA: Insufficient documentation

## 2020-07-11 LAB — BASIC METABOLIC PANEL
Anion gap: 11 (ref 5–15)
BUN: 13 mg/dL (ref 8–23)
CO2: 27 mmol/L (ref 22–32)
Calcium: 9.8 mg/dL (ref 8.9–10.3)
Chloride: 99 mmol/L (ref 98–111)
Creatinine, Ser: 0.52 mg/dL (ref 0.44–1.00)
GFR, Estimated: >60 mL/min (ref >60)
Glucose, Bld: 102 mg/dL — ABNORMAL HIGH (ref 70–99)
Potassium: 4.5 mmol/L (ref 3.5–5.1)
Sodium: 137 mmol/L (ref 135–145)

## 2020-07-11 LAB — CBC WITH DIFFERENTIAL/PLATELET
Abs Immature Granulocytes: 0.01 10*3/uL (ref 0.00–0.07)
Basophils Absolute: 0 10*3/uL (ref 0.0–0.1)
Basophils Relative: 0 %
Eosinophils Absolute: 0.1 10*3/uL (ref 0.0–0.5)
Eosinophils Relative: 1 %
HCT: 50.9 % — ABNORMAL HIGH (ref 36.0–46.0)
Hemoglobin: 16.1 g/dL — ABNORMAL HIGH (ref 12.0–15.0)
Immature Granulocytes: 0 %
Lymphocytes Relative: 9 %
Lymphs Abs: 0.4 10*3/uL — ABNORMAL LOW (ref 0.7–4.0)
MCH: 31.9 pg (ref 26.0–34.0)
MCHC: 31.6 g/dL (ref 30.0–36.0)
MCV: 101 fL — ABNORMAL HIGH (ref 80.0–100.0)
Monocytes Absolute: 0.4 10*3/uL (ref 0.1–1.0)
Monocytes Relative: 9 %
Neutro Abs: 3.8 10*3/uL (ref 1.7–7.7)
Neutrophils Relative %: 81 %
Platelets: 358 10*3/uL (ref 150–400)
RBC: 5.04 MIL/uL (ref 3.87–5.11)
RDW: 13 % (ref 11.5–15.5)
WBC: 4.8 10*3/uL (ref 4.0–10.5)
nRBC: 0 % (ref 0.0–0.2)

## 2020-07-11 MED ORDER — ONDANSETRON HCL 4 MG/2ML IJ SOLN
4.0000 mg | Freq: Once | INTRAMUSCULAR | Status: AC
  Administered 2020-07-11: 4 mg via INTRAVENOUS
  Filled 2020-07-11: qty 2

## 2020-07-11 MED ORDER — FENTANYL CITRATE (PF) 100 MCG/2ML IJ SOLN
50.0000 ug | Freq: Once | INTRAMUSCULAR | Status: AC
  Administered 2020-07-11: 50 ug via INTRAVENOUS
  Filled 2020-07-11: qty 2

## 2020-07-11 MED ORDER — HYDROCODONE-ACETAMINOPHEN 5-325 MG PO TABS: 1.0000 | ORAL_TABLET | Freq: Four times a day (QID) | ORAL | 0 refills | Status: DC | PRN

## 2020-07-11 NOTE — Progress Notes (Addendum)
07/12/2020 !:30 pm TOC contacted pt via phone. States she had HH in the past. Explained [facility] will call to arrange visit for [facility] RN and PT to come out. Pt states she has RW and cane at home. She lives at home alone but independent at home. Gave permission to speak to dtr in case of emergency. TOC CM referral sent to [facility] rep, Ronalee Belts. Jonnie Finner RN CCM, [facility] ED TOC CM [phone]   07/11/2020 7 pm TOC CM referral for HH. Per [place], pt had [facility] in the past. Requested [facility] from ED provider. Will follow up with pt on 07/12/2020 for [facility]. [place], [place] ED TOC CM [phone]

## 2020-07-11 NOTE — ED Triage Notes (Signed)
Pt BIB EMS  Per EMS complains of pain from lumbar area, she has been dealing with this for a year. Prescribed pain medication for back pain.   Vitals  122/74 70 P 16 R  99 room air

## 2020-07-11 NOTE — Telephone Encounter (Signed)
Called to schedule kyphoplasty, no answer, left vm. AW  

## 2020-07-11 NOTE — Telephone Encounter (Signed)
Encounter created to place orders for home health

## 2020-07-11 NOTE — ED Provider Notes (Signed)
[facility] DEPT Provider Note   CSN: [id number] Arrival date & time: 07/11/20  1524     History Chief Complaint  Patient presents with  . Back Pain    [name] is a 87 y.o. female.  Presented to ER with concern for back pain.  Patient states she is having pain in her mid back and lower back.  Has been struggling with pain for quite some time, went to ER 1 week ago and was diagnosed with new compression fracture of T9 with 50% height loss, mild spinal canal narrowing.  She was recommended to follow-up with IR for consideration of kyphoplasty and her primary doctor.  Patient states that she has not done this yet.  Has been taking Tylenol, oxycodone at home with intermittent relief.  Pain is relatively constant, sharp, stabbing, radiates towards her left side.  Up to 10 out of 10 in severity.  Having some associated nausea.  No vomiting.  No abdominal pain.  No fever, numbness, weakness, tingling, bladder or bowel incontinence or urinary retention.  HPI     Past Medical History:  Diagnosis Date  . CAD (coronary artery disease)    mild nonobstructive disease on cath in 2003  . Cancer ([place])   . Cardiomyopathy    Probable Takotsubo, severe CP w/ normal cath in 1994. Severe CP in 2003 w/ widespread T wave inversions on ECG. Cath w/ minimal coronary disease but LV-gram showed periapical severe hypokinesis and basilar hyperkinesia (EF 40%). Last echo in 4/09 confirmed full LV functional recovery with EF 60%, no regional wall motion abnormalities, mild to moderate LVH.  Marland Kitchen Chronic diastolic CHF (congestive heart failure) ([place]) 09/13/2019  . CVA (cerebral infarction)   . Depression with anxiety 03-29-11   lost husband 3'09  . Diverticulitis   . DVT (deep venous thrombosis) ([place])    after venous ablation, R leg  . Dyspnea   . E. coli gastroenteritis 03-29-11   8'10  . Fracture 09/10/07   L2, status post vertebroplasty of L2 performed by IR  . GERD  (gastroesophageal reflux disease)   . Headache(784.0)    migraines  . Hemorrhoids   . Hiatal hernia 03-29-11   no nerve problems  . Hyperlipidemia   . Hyperplastic colon polyp 06/2001  . Hypertension   . Iron deficiency anemia due to chronic blood loss 12/30/2017  . OA (osteoarthritis) of knee 03-29-11   w/ bilateral knee pain-not a problem now  . OSA (obstructive sleep apnea) 03-29-11   no cpap used, not a problem now.  . Osteoporosis   . Otalgia of both ears    Dr. Simeon Craft  . Polycythemia   . Stroke [facility]) 03-29-11   CVA x2 -last 10'12-?TIA(visual problems)  . Varicose vein     Patient Active Problem List   Diagnosis Date Noted  . Acute midline thoracic back pain 03/04/2020  . Malnutrition of moderate degree 12/27/2019  . Pressure injury of skin 12/27/2019  . S/p left hip fracture   . Comminuted fracture of hip, left, open type I or II, initial encounter ([place]) 12/24/2019  . Closed left hip fracture ([place]) 12/24/2019  . ICH (intracerebral hemorrhage) ([place]) 11/04/2019  . Intractable back pain 09/13/2019  . Compression fracture of T9 vertebra ([place]) 09/13/2019  . Chronic diastolic CHF (congestive heart failure) ([place]) 09/13/2019  . Overweight (BMI 25.0-29.9) 09/13/2019  . Constipation 09/13/2019  . Raynaud's phenomenon without gangrene 05/19/2018  . Iron deficiency anemia due to chronic blood loss 12/30/2017  .  Insomnia 06/25/2017  . Mild CAD 05/24/2017  . PCP NOTES >>> 02/15/2015  . Superficial thrombophlebitis 04/06/2014  . Annual physical exam 08/18/2012  . Edema 04/14/2012  . Breast cancer ([place]) 11/05/2011  . Polycythemia 11/05/2011  . Chest pain 01/16/2011  . TIA (transient ischemic attack) 01/16/2011  . CARDIOMYOPATHY 08/03/2009  . DJD -- pain mgmt 08/16/2008  . ABDOMINAL PAIN, LEFT LOWER QUADRANT 07/05/2008  . GAIT DISTURBANCE 10/21/2007  . ANXIETY DEPRESSION 10/08/2007  . Hyperlipidemia 06/25/2007  . Takotsubo cardiomyopathy 06/25/2007  . GERD without esophagitis  06/25/2007  . Essential hypertension 09/30/2006  . Senile osteoporosis 09/30/2006  . Migraine with aura 09/30/2006    Past Surgical History:  Procedure Laterality Date  . APPENDECTOMY    . BACK SURGERY    . BREAST BIOPSY    . BREAST CYST EXCISION    . BREAST LUMPECTOMY Left 2013   left stage I left breast cancer  . CHOLECYSTECTOMY  04/09/2011   Procedure: LAPAROSCOPIC CHOLECYSTECTOMY WITH INTRAOPERATIVE CHOLANGIOGRAM;  Surgeon: Odis Hollingshead, MD;  Location: [facility] ORS;  Service: General;  Laterality: N/A;  . Dental Extraction     L maxillary molar  . INTRAMEDULLARY (IM) NAIL INTERTROCHANTERIC Left 12/25/2019   Procedure: INTRAMEDULLARY (IM) NAIL INTERTROCHANTRIC;  Surgeon: Paralee Cancel, MD;  Location: [facility] OR[facility];  Service: Orthopedics;  Laterality: Left;  . IR KYPHO THORACIC WITH BONE BIOPSY  09/15/2019  . IR VERTEBROPLASTY CERV/THOR BX INC UNI/BIL INC/INJECT/IMAGING  11/10/2019  . KYPHOSIS SURGERY  08/2007   cement used  . OVARIAN CYST SURGERY     left  . RADIOLOGY WITH ANESTHESIA N/A 11/10/2019   Procedure: VERTEBROPLASTY;  Surgeon: Luanne Bras, MD;  Location: [facility];  Service: Radiology;  Laterality: N/A;  . TONSILLECTOMY    . TOTAL KNEE ARTHROPLASTY  06/2010   left  . TUBAL LIGATION       OB History    Gravida  3   Para  3   Term      Preterm      AB      Living  3     SAB      IAB      Ectopic      Multiple      Live Births              Family History  Problem Relation Age of Onset  . Breast cancer Mother   . Heart disease Father        MIs  . Colon cancer Other        cousin  . Breast cancer Sister   . Leukemia Brother        GM    Social History   Tobacco Use  . Smoking status: Former Smoker    Packs/day: 1.00    Years: 37.00    Pack years: 37.00    Types: Cigarettes    Start date: 06/26/1949    Quit date: 05/14/1988    Years since quitting: 32.1  . Smokeless tobacco: Never Used  . Tobacco comment: quit 25 years ago  Vaping Use   . Vaping Use: Never used  Substance Use Topics  . Alcohol use: Yes    Alcohol/week: 1.0 standard drink    Types: 1 Glasses of wine per week    Comment: occasional/social  . Drug use: No    Home Medications Prior to Admission medications   Medication Sig Start Date End Date Taking? Authorizing Provider  HYDROcodone-acetaminophen (NORCO/VICODIN) 5-325 MG tablet Take  1 tablet by mouth every 6 (six) hours as needed for up to 3 days for severe pain. 07/11/20 07/14/20 Yes Kleber Crean, Ellwood Dense, MD  acetaminophen (TYLENOL) 500 MG tablet Take 1,000 mg by mouth every 8 (eight) hours as needed for mild pain or moderate pain. NTE 6 tabs/24 hours    [provider]  alendronate (FOSAMAX) 70 MG tablet Take 1 tablet (70 mg total) by mouth every 7 (seven) days. Take with a full glass of water on an empty stomach. 06/08/20   Colon Branch, MD  aspirin 81 MG EC tablet Take 1 tablet (81 mg total) by mouth daily. Swallow whole. 02/22/20   Colon Branch, MD  bisacodyl (DULCOLAX) 10 MG suppository Place 1 suppository (10 mg total) rectally daily as needed for moderate constipation. 09/17/19   [name], Corrie Mckusick, MD  bismuth subsalicylate (PEPTO BISMOL) 262 MG/15ML suspension Take 30 mLs by mouth every 4 (four) hours as needed for indigestion (heartburn).     [provider]  cyclobenzaprine (FLEXERIL) 5 MG tablet Take 1 tablet (5 mg total) by mouth 3 (three) times daily as needed for muscle spasms. 07/04/20   Pattricia Boss, MD  docusate sodium (COLACE) 100 MG capsule Take 100 mg by mouth 2 (two) times daily.     [provider]  furosemide (LASIX) 20 MG tablet Take 1 tablet (20 mg total) by mouth daily. 06/08/20   Colon Branch, MD  hydroxyurea (HYDREA) 500 MG capsule Take 1 capsule (500 mg total) by mouth every other day. May take with food to minimize GI side effects. 04/14/20   [place], Holli Humbles, NP  Infant Care Products (DERMACLOUD) CREA Apply 1 application topically See admin instructions. Apply to  buttocks topically every shift for rash 12/11/19   Colon Branch, MD  lactose free nutrition (BOOST) LIQD Take 237 mLs by mouth See admin instructions. Every shift    [provider]  levETIRAcetam (KEPPRA) 250 MG tablet Take 1 tablet (250 mg total) by mouth 2 (two) times daily. 06/08/20   Colon Branch, MD  Lidocaine 4 % PTCH LIDOCAINE 4% PATCH :Apply 1 patch topically daily and remove after 12 hours to left lateral chest Patient taking differently: Apply 1 patch topically daily. Apply to left lateral chest. Remove after 12 hours. 11/12/19   Rai, Ripudeep K, MD  LORazepam (ATIVAN) 0.5 MG tablet TAKE 1/2 TABLET BY MOUTH EVERY MORNING AND EVERY AFTERNOON THEN TAKE 2 TABLETS BY MOUTH AT BEDTIME AS NEEDED 05/17/20   Colon Branch, MD  metoprolol tartrate (LOPRESSOR) 50 MG tablet Take 1 tablet (50 mg total) by mouth 2 (two) times daily. 10/09/19   Colon Branch, MD  Multiple Vitamin (MULTIVITAMIN WITH MINERALS) TABS tablet Take 1 tablet by mouth daily. 12/29/19   Georgette Shell, MD  nitroGLYCERIN (NITROSTAT) 0.4 MG SL tablet Place 1 tablet (0.4 mg total) under the tongue every 5 (five) minutes x 3 doses as needed for chest pain. 04/22/20   Colon Branch, MD  omeprazole (PRILOSEC) 20 MG capsule Take 1 capsule (20 mg total) by mouth daily. 10/05/19   [place] Lewis C, PA-C  ondansetron (ZOFRAN) 4 MG tablet Take 1 tablet (4 mg total) by mouth every 6 (six) hours as needed for nausea or vomiting. 10/05/19   [place] Lewis C, PA-C  polyethylene glycol (MIRALAX / GLYCOLAX) 17 g packet Take 17 g by mouth daily.    [provider]  tiZANidine (ZANAFLEX) 2 MG tablet Take 2 mg by  mouth every 6 (six) hours as needed for muscle spasms.    [provider]  valACYclovir (VALTREX) 500 MG tablet TAKE 2 TABLETS BY MOUTH AT ONSET OF RASH AND 2 TABLETS 12 HOURS LATER AS NEEDED 04/29/20   [place], Holli Humbles, NP    Allergies    Penicillins, Valsartan, Atorvastatin, Carvedilol, Ezetimibe, Fluvastatin sodium,  Magnesium hydroxide, Meloxicam, Pneumovax [pneumococcal polysaccharide vaccine], Quinapril hcl, Simvastatin, Topamax [topiramate], and Vit d-vit e-safflower oil  Review of Systems   Review of Systems  Constitutional: Negative for chills and fever.  HENT: Negative for ear pain and sore throat.   Eyes: Negative for pain and visual disturbance.  Respiratory: Negative for cough and shortness of breath.   Cardiovascular: Negative for chest pain and palpitations.  Gastrointestinal: Negative for abdominal pain and vomiting.  Genitourinary: Negative for dysuria and hematuria.  Musculoskeletal: Positive for back pain. Negative for arthralgias.  Skin: Negative for color change and rash.  Neurological: Negative for seizures and syncope.  All other systems reviewed and are negative.   Physical Exam Updated Vital Signs BP (!) 158/88   Pulse 96   Temp (!) 97 F (36.1 C) (Oral)   Resp (!) 24   SpO2 98%   Physical Exam Vitals and nursing note reviewed.  Constitutional:      General: She is not in acute distress.    Appearance: She is well-developed and well-nourished.  HENT:     Head: Normocephalic and atraumatic.  Eyes:     Conjunctiva/sclera: Conjunctivae normal.  Cardiovascular:     Rate and Rhythm: Normal rate and regular rhythm.     Heart sounds: No murmur heard.   Pulmonary:     Effort: Pulmonary effort is normal. No respiratory distress.     Breath sounds: Normal breath sounds.  Abdominal:     Palpations: Abdomen is soft.     Tenderness: There is no abdominal tenderness.  Musculoskeletal:        General: No deformity, signs of injury or edema.     Cervical back: Neck supple.     Comments: Tenderness noted in lower T-spine, L-spine, no step-off or deformity noted  Skin:    General: Skin is warm and dry.  Neurological:     Mental Status: She is alert.     Comments: 5 out of 5 strength in lower extremities, sensation to light touch intact in lower extremities  Psychiatric:         Mood and Affect: Mood and affect and mood normal.        Behavior: Behavior normal.     ED Results / Procedures / Treatments   Labs (all labs ordered are listed, but only abnormal results are displayed) Labs Reviewed  CBC WITH DIFFERENTIAL/PLATELET - Abnormal; Notable for the following components:      Result Value   Hemoglobin 16.1 (*)    HCT 50.9 (*)    MCV 101.0 (*)    Lymphs Abs 0.4 (*)    All other components within normal limits  BASIC METABOLIC PANEL - Abnormal; Notable for the following components:   Glucose, Bld 102 (*)    All other components within normal limits    EKG None  Radiology No results found.  Procedures Procedures   Medications Ordered in ED Medications  fentaNYL (SUBLIMAZE) injection 50 mcg (50 mcg Intravenous Given 07/11/20 1651)  ondansetron (ZOFRAN) injection 4 mg (4 mg Intravenous Given 07/11/20 1651)    ED Course  I have reviewed the triage vital signs  and the nursing notes.  Pertinent labs & imaging results that were available during my care of the patient were reviewed by me and considered in my medical decision making (see chart for details).    MDM Rules/Calculators/A&P                          87 year old lady presenting to the emergency room with concern for back pain.  Recent visit for similar symptoms, found to have new T10 compression fracture.  On my exam today, patient is well-appearing in no distress, neuro intact.  Symptoms improved after receiving single dose of IV analgesia.  Discussed with IR scheduler, working on arrangements for patient to receive kyphoplasty later this week.  No new falls or new symptoms today.  Her basic labs are stable.  Believe she is appropriate to continue outpatient management at this time.  Reviewed with patient and daughter plan.  Will discharge home.   After the discussed management above, the patient was determined to be safe for discharge.  The patient was in agreement with this plan and  all questions regarding their care were answered.  ED return precautions were discussed and the patient will return to the ED with any significant worsening of condition.   Final Clinical Impression(s) / ED Diagnoses Final diagnoses:  Low back pain, unspecified back pain laterality, unspecified chronicity, unspecified whether sciatica present  Closed fracture of tenth thoracic vertebra with routine healing, unspecified fracture morphology, subsequent encounter    Rx / DC Orders ED Discharge Orders         Ordered    HYDROcodone-acetaminophen (NORCO/VICODIN) 5-325 MG tablet  Every 6 hours PRN        07/11/20 1803           Lucrezia Starch, MD 07/11/20 1906

## 2020-07-11 NOTE — Discharge Instructions (Signed)
Follow-up for your kyphoplasty as discussed.  Can take the prescribed hydrocodone as needed for breakthrough pain.  Note this contains a small amount of Tylenol.  Additionally, hydrocodone can make you drowsy and should not be taken while driving or operating heavy machinery.  If pain is uncontrolled, you develop numbness, weakness or other new concerns symptom, return to ER for reassessment.

## 2020-07-12 ENCOUNTER — Other Ambulatory Visit: Payer: Self-pay | Admitting: Radiology

## 2020-07-13 ENCOUNTER — Encounter (HOSPITAL_COMMUNITY): Payer: Self-pay

## 2020-07-13 ENCOUNTER — Other Ambulatory Visit: Payer: Self-pay

## 2020-07-13 ENCOUNTER — Ambulatory Visit (HOSPITAL_COMMUNITY)
Admission: RE | Admit: 2020-07-13 | Discharge: 2020-07-13 | Disposition: A | Discharge status: Home / Self Care | Payer: Medicare Other | Source: Ambulatory Visit | Attending: Neuroradiology | Admitting: Neuroradiology

## 2020-07-13 DIAGNOSIS — Z96652 Presence of left artificial knee joint: Secondary | ICD-10-CM | POA: Diagnosis not present

## 2020-07-13 DIAGNOSIS — I5032 Chronic diastolic (congestive) heart failure: Secondary | ICD-10-CM | POA: Insufficient documentation

## 2020-07-13 DIAGNOSIS — Z87891 Personal history of nicotine dependence: Secondary | ICD-10-CM | POA: Diagnosis not present

## 2020-07-13 DIAGNOSIS — I429 Cardiomyopathy, unspecified: Secondary | ICD-10-CM | POA: Insufficient documentation

## 2020-07-13 DIAGNOSIS — Z800 Family history of malignant neoplasm of digestive organs: Secondary | ICD-10-CM | POA: Insufficient documentation

## 2020-07-13 DIAGNOSIS — Z886 Allergy status to analgesic agent status: Secondary | ICD-10-CM | POA: Insufficient documentation

## 2020-07-13 DIAGNOSIS — Z888 Allergy status to other drugs, medicaments and biological substances status: Secondary | ICD-10-CM | POA: Diagnosis not present

## 2020-07-13 DIAGNOSIS — Z8249 Family history of ischemic heart disease and other diseases of the circulatory system: Secondary | ICD-10-CM | POA: Insufficient documentation

## 2020-07-13 DIAGNOSIS — Z880 Allergy status to penicillin: Secondary | ICD-10-CM | POA: Diagnosis not present

## 2020-07-13 DIAGNOSIS — Z7982 Long term (current) use of aspirin: Secondary | ICD-10-CM | POA: Insufficient documentation

## 2020-07-13 DIAGNOSIS — Z803 Family history of malignant neoplasm of breast: Secondary | ICD-10-CM | POA: Diagnosis not present

## 2020-07-13 DIAGNOSIS — I110 Hypertensive heart disease with heart failure: Secondary | ICD-10-CM | POA: Diagnosis not present

## 2020-07-13 DIAGNOSIS — Z7983 Long term (current) use of bisphosphonates: Secondary | ICD-10-CM | POA: Insufficient documentation

## 2020-07-13 DIAGNOSIS — Z853 Personal history of malignant neoplasm of breast: Secondary | ICD-10-CM | POA: Insufficient documentation

## 2020-07-13 DIAGNOSIS — Z9049 Acquired absence of other specified parts of digestive tract: Secondary | ICD-10-CM | POA: Diagnosis not present

## 2020-07-13 DIAGNOSIS — M8008XA Age-related osteoporosis with current pathological fracture, vertebra(e), initial encounter for fracture: Secondary | ICD-10-CM | POA: Insufficient documentation

## 2020-07-13 DIAGNOSIS — Z8673 Personal history of transient ischemic attack (TIA), and cerebral infarction without residual deficits: Secondary | ICD-10-CM | POA: Diagnosis not present

## 2020-07-13 DIAGNOSIS — Z806 Family history of leukemia: Secondary | ICD-10-CM | POA: Diagnosis not present

## 2020-07-13 DIAGNOSIS — S22060A Wedge compression fracture of T7-T8 vertebra, initial encounter for closed fracture: Secondary | ICD-10-CM

## 2020-07-13 DIAGNOSIS — Z79899 Other long term (current) drug therapy: Secondary | ICD-10-CM | POA: Diagnosis not present

## 2020-07-13 DIAGNOSIS — Z86718 Personal history of other venous thrombosis and embolism: Secondary | ICD-10-CM | POA: Insufficient documentation

## 2020-07-13 DIAGNOSIS — G4733 Obstructive sleep apnea (adult) (pediatric): Secondary | ICD-10-CM | POA: Insufficient documentation

## 2020-07-13 DIAGNOSIS — M4854XA Collapsed vertebra, not elsewhere classified, thoracic region, initial encounter for fracture: Secondary | ICD-10-CM | POA: Diagnosis not present

## 2020-07-13 DIAGNOSIS — X58XXXA Exposure to other specified factors, initial encounter: Secondary | ICD-10-CM | POA: Insufficient documentation

## 2020-07-13 DIAGNOSIS — Z887 Allergy status to serum and vaccine status: Secondary | ICD-10-CM | POA: Insufficient documentation

## 2020-07-13 HISTORY — PX: IR KYPHO THORACIC WITH BONE BIOPSY: IMG5518

## 2020-07-13 LAB — CBC
HCT: 49.3 % — ABNORMAL HIGH (ref 36.0–46.0)
Hemoglobin: 16 g/dL — ABNORMAL HIGH (ref 12.0–15.0)
MCH: 32.7 pg (ref 26.0–34.0)
MCHC: 32.5 g/dL (ref 30.0–36.0)
MCV: 100.6 fL — ABNORMAL HIGH (ref 80.0–100.0)
Platelets: 358 10*3/uL (ref 150–400)
RBC: 4.9 MIL/uL (ref 3.87–5.11)
RDW: 13.1 % (ref 11.5–15.5)
WBC: 4.6 10*3/uL (ref 4.0–10.5)
nRBC: 0 % (ref 0.0–0.2)

## 2020-07-13 LAB — BASIC METABOLIC PANEL
Anion gap: 8 (ref 5–15)
BUN: 10 mg/dL (ref 8–23)
CO2: 31 mmol/L (ref 22–32)
Calcium: 10.2 mg/dL (ref 8.9–10.3)
Chloride: 99 mmol/L (ref 98–111)
Creatinine, Ser: 0.7 mg/dL (ref 0.44–1.00)
GFR, Estimated: >60 mL/min (ref >60)
Glucose, Bld: 117 mg/dL — ABNORMAL HIGH (ref 70–99)
Potassium: 4.1 mmol/L (ref 3.5–5.1)
Sodium: 138 mmol/L (ref 135–145)

## 2020-07-13 LAB — PROTIME-INR
INR: 1 (ref 0.8–1.2)
Prothrombin Time: 13.2 seconds (ref 11.4–15.2)

## 2020-07-13 LAB — APTT: aPTT: 34 seconds (ref 24–36)

## 2020-07-13 MED ORDER — FENTANYL CITRATE (PF) 100 MCG/2ML IJ SOLN
INTRAMUSCULAR | Status: AC
  Filled 2020-07-13: qty 2

## 2020-07-13 MED ORDER — ONDANSETRON HCL 4 MG/2ML IJ SOLN
4.0000 mg | Freq: Once | INTRAMUSCULAR | Status: AC
  Administered 2020-07-13: 4 mg via INTRAVENOUS
  Filled 2020-07-13: qty 2

## 2020-07-13 MED ORDER — MIDAZOLAM HCL 2 MG/2ML IJ SOLN
INTRAMUSCULAR | Status: AC
  Filled 2020-07-13: qty 2

## 2020-07-13 MED ORDER — FENTANYL CITRATE (PF) 100 MCG/2ML IJ SOLN
INTRAMUSCULAR | Status: AC | PRN
  Administered 2020-07-13 (×4): 25 ug via INTRAVENOUS
  Administered 2020-07-13 (×2): 50 ug via INTRAVENOUS

## 2020-07-13 MED ORDER — VANCOMYCIN HCL IN DEXTROSE 1-5 GM/200ML-% IV SOLN: 1000.0000 mg | Freq: Once | INTRAVENOUS | Status: AC

## 2020-07-13 MED ORDER — IOHEXOL 300 MG/ML  SOLN
50.0000 mL | Freq: Once | INTRAMUSCULAR | Status: AC | PRN
  Administered 2020-07-13: 10 mL via INTRAVENOUS

## 2020-07-13 MED ORDER — MIDAZOLAM HCL 2 MG/2ML IJ SOLN
INTRAMUSCULAR | Status: AC | PRN
Start: 2020-07-13 — End: 2020-07-13
  Administered 2020-07-13 (×4): 1 mg via INTRAVENOUS

## 2020-07-13 MED ORDER — LIDOCAINE HCL 1 % IJ SOLN
INTRAMUSCULAR | Status: AC
  Administered 2020-07-13: 5 mL
  Filled 2020-07-13: qty 20

## 2020-07-13 MED ORDER — BUPIVACAINE HCL (PF) 0.5 % IJ SOLN
INTRAMUSCULAR | Status: AC
  Administered 2020-07-13: 5 mL via INTRASPINAL
  Filled 2020-07-13: qty 30

## 2020-07-13 MED ORDER — VANCOMYCIN HCL IN DEXTROSE 1-5 GM/200ML-% IV SOLN
INTRAVENOUS | Status: AC
  Administered 2020-07-13: 1000 mg via INTRAVENOUS
  Filled 2020-07-13: qty 200

## 2020-07-13 MED ORDER — SODIUM CHLORIDE 0.9 % IV SOLN
INTRAVENOUS | Status: AC | PRN
  Administered 2020-07-13 (×2): 250 mL via INTRAVENOUS

## 2020-07-13 NOTE — Discharge Instructions (Signed)
KYPHOPLASTY/VERTEBROPLASTY DISCHARGE INSTRUCTIONS  Medications: (check all that apply)     Resume all home medications as before procedure.       Resume your (aspirin/Plavix/Coumadin) on                  Continue your pain medications as prescribed as needed.  Over the next 3-5 days, decrease your pain medication as tolerated.  Over the counter medications (i.e. Tylenol, ibuprofen, and aleve) may be substituted once severe/moderate pain symptoms have subsided.   Wound Care: Bandages may be removed the day following your procedure.  You may get your incision wet once bandages are removed.  Bandaids may be used to cover the incisions until scab formation.  Topical ointments are optional.  If you develop a fever greater than 101 degrees, have increased skin redness at the incision sites or pus-like oozing from incisions occurring within 1 week of the procedure, contact radiology at [phone] or [phone].  Ice pack to back for 15-20 minutes 2-3 time per day for first 2-3 days post procedure.  The ice will expedite muscle healing and help with the pain from the incisions.   Activity: Bedrest today with limited activity for 24 hours post procedure.  No driving for 48 hours.  Increase your activity as tolerated after bedrest (with assistance if necessary).  Refrain from any strenuous activity or heavy lifting (greater than 10 lbs.).   Follow up: Contact radiology at [phone] or [phone] if any questions/concerns.  A physician assistant from radiology will contact you in approximately 1 week.  If a biopsy was performed at the time of your procedure, your referring physician should receive the results in usually 2-3 days.         

## 2020-07-13 NOTE — Progress Notes (Signed)
Pt c/o nausea Myriam Jacobson, [place] called and informed new orders noted

## 2020-07-13 NOTE — H&P (Signed)
Chief Complaint: Patient was seen in consultation today for T8 compression fracture  Referring Physician(s): [place]  Supervising Physician: Pedro Earls  Patient Status: [facility] - Out-pt  History of Present Illness: [name] is a 87 y.o. female with past medical history of CAD, cardiomyopathy, chronic diastolic CHF, CVA, GERD, HTN, varicose veins who is familiar to IR from prior vertebroplasty/kyphoplasty for vertebral compression fractures.  Patient underwent L2 vertebroplasty 09/10/2007 with Dr. Vernard Gambles, T9 kyphoplasty 09/13/19 by Dr. Laurence Ferrari, and T4 vertebroplasty 11/10/19 by Dr. Estanislado Pandy. She now presents with T8 compression fracture which is painful.  She is interested in pursuing vertebroplasty/kyphoplasty for pain management.   [name] presents to [facility] for procedure today.  She endoses nausea/vomiting which is typical for her when she is taking narcotic pain medications.  She reports that a few days ago she began taking hydrocodone with Tylenol was not sufficient to manage her back pain.  She also has hip pain related to a prior fracture.  She is otherwise in her usual state of health.  Denies fever, chills, nausea, vomiting, abdominal pain, dysuria.   Past Medical History:  Diagnosis Date  . CAD (coronary artery disease)    mild nonobstructive disease on cath in 2003  . Cancer ([place])   . Cardiomyopathy    Probable Takotsubo, severe CP w/ normal cath in 1994. Severe CP in 2003 w/ widespread T wave inversions on ECG. Cath w/ minimal coronary disease but LV-gram showed periapical severe hypokinesis and basilar hyperkinesia (EF 40%). Last echo in 4/09 confirmed full LV functional recovery with EF 60%, no regional wall motion abnormalities, mild to moderate LVH.  Marland Kitchen Chronic diastolic CHF (congestive heart failure) ([place]) 09/13/2019  . CVA (cerebral infarction)   . Depression with anxiety 03-29-11   lost husband 3'09  .  Diverticulitis   . DVT (deep venous thrombosis) ([place])    after venous ablation, R leg  . Dyspnea   . E. coli gastroenteritis 03-29-11   8'10  . Fracture 09/10/07   L2, status post vertebroplasty of L2 performed by IR  . GERD (gastroesophageal reflux disease)   . Headache(784.0)    migraines  . Hemorrhoids   . Hiatal hernia 03-29-11   no nerve problems  . Hyperlipidemia   . Hyperplastic colon polyp 06/2001  . Hypertension   . Iron deficiency anemia due to chronic blood loss 12/30/2017  . OA (osteoarthritis) of knee 03-29-11   w/ bilateral knee pain-not a problem now  . OSA (obstructive sleep apnea) 03-29-11   no cpap used, not a problem now.  . Osteoporosis   . Otalgia of both ears    Dr. Simeon Craft  . Polycythemia   . Stroke [facility]) 03-29-11   CVA x2 -last 10'12-?TIA(visual problems)  . Varicose vein     Past Surgical History:  Procedure Laterality Date  . APPENDECTOMY    . BACK SURGERY    . BREAST BIOPSY    . BREAST CYST EXCISION    . BREAST LUMPECTOMY Left 2013   left stage I left breast cancer  . CHOLECYSTECTOMY  04/09/2011   Procedure: LAPAROSCOPIC CHOLECYSTECTOMY WITH INTRAOPERATIVE CHOLANGIOGRAM;  Surgeon: Odis Hollingshead, MD;  Location: [facility] ORS;  Service: General;  Laterality: N/A;  . Dental Extraction     L maxillary molar  . INTRAMEDULLARY (IM) NAIL INTERTROCHANTERIC Left 12/25/2019   Procedure: INTRAMEDULLARY (IM) NAIL INTERTROCHANTRIC;  Surgeon: Paralee Cancel, MD;  Location: [facility]S;  Service: Orthopedics;  Laterality: Left;  .  IR KYPHO THORACIC WITH BONE BIOPSY  09/15/2019  . IR VERTEBROPLASTY CERV/THOR BX INC UNI/BIL INC/INJECT/IMAGING  11/10/2019  . KYPHOSIS SURGERY  08/2007   cement used  . OVARIAN CYST SURGERY     left  . RADIOLOGY WITH ANESTHESIA N/A 11/10/2019   Procedure: VERTEBROPLASTY;  Surgeon: Luanne Bras, MD;  Location: [place];  Service: Radiology;  Laterality: N/A;  . TONSILLECTOMY    . TOTAL KNEE ARTHROPLASTY  06/2010   left  . TUBAL LIGATION       Allergies: Penicillins, Valsartan, Atorvastatin, Carvedilol, Ezetimibe, Fluvastatin sodium, Magnesium hydroxide, Meloxicam, Pneumovax [pneumococcal polysaccharide vaccine], Quinapril hcl, Simvastatin, Topamax [topiramate], and Vit d-vit e-safflower oil  Medications: Prior to Admission medications   Medication Sig Start Date End Date Taking? Authorizing Provider  acetaminophen (TYLENOL) 500 MG tablet Take 1,000 mg by mouth every 8 (eight) hours as needed for mild pain or moderate pain. NTE 6 tabs/24 hours   Yes [provider]  alendronate (FOSAMAX) 70 MG tablet Take 1 tablet (70 mg total) by mouth every 7 (seven) days. Take with a full glass of water on an empty stomach. 06/08/20  Yes Colon Branch, MD  aspirin 81 MG EC tablet Take 1 tablet (81 mg total) by mouth daily. Swallow whole. 02/22/20  Yes Paz, Alda Berthold, MD  bisacodyl (DULCOLAX) 10 MG suppository Place 1 suppository (10 mg total) rectally daily as needed for moderate constipation. 09/17/19  Yes [name], Laxman, MD  bismuth subsalicylate (PEPTO BISMOL) 262 MG/15ML suspension Take 30 mLs by mouth every 4 (four) hours as needed for indigestion (heartburn).    Yes [provider]  cyclobenzaprine (FLEXERIL) 5 MG tablet Take 1 tablet (5 mg total) by mouth 3 (three) times daily as needed for muscle spasms. 07/04/20  Yes Pattricia Boss, MD  docusate sodium (COLACE) 100 MG capsule Take 100 mg by mouth 2 (two) times daily.    Yes [provider]  furosemide (LASIX) 20 MG tablet Take 1 tablet (20 mg total) by mouth daily. 06/08/20  Yes Paz, Alda Berthold, MD  HYDROcodone-acetaminophen (NORCO/VICODIN) 5-325 MG tablet Take 1 tablet by mouth every 6 (six) hours as needed for up to 3 days for severe pain. 07/11/20 07/14/20 Yes Dykstra, Ellwood Dense, MD  hydroxyurea (HYDREA) 500 MG capsule Take 1 capsule (500 mg total) by mouth every other day. May take with food to minimize GI side effects. 04/14/20  Yes [place], Holli Humbles, NP  Infant Care  Products (DERMACLOUD) CREA Apply 1 application topically See admin instructions. Apply to buttocks topically every shift for rash 12/11/19  Yes Paz, Alda Berthold, MD  lactose free nutrition (BOOST) LIQD Take 237 mLs by mouth See admin instructions. Every shift   Yes [provider]  levETIRAcetam (KEPPRA) 250 MG tablet Take 1 tablet (250 mg total) by mouth 2 (two) times daily. 06/08/20  Yes Paz, Alda Berthold, MD  Lidocaine 4 % PTCH LIDOCAINE 4% PATCH :Apply 1 patch topically daily and remove after 12 hours to left lateral chest Patient taking differently: Apply 1 patch topically daily. Apply to left lateral chest. Remove after 12 hours. 11/12/19  Yes Rai, Ripudeep K, MD  LORazepam (ATIVAN) 0.5 MG tablet TAKE 1/2 TABLET BY MOUTH EVERY MORNING AND EVERY AFTERNOON THEN TAKE 2 TABLETS BY MOUTH AT BEDTIME AS NEEDED 05/17/20  Yes Paz, Alda Berthold, MD  metoprolol tartrate (LOPRESSOR) 50 MG tablet Take 1 tablet (50 mg total) by mouth 2 (two) times daily. 10/09/19  Yes Colon Branch, MD  omeprazole (PRILOSEC) 20 MG capsule Take 1 capsule (20 mg total) by mouth daily. 10/05/19  Yes [place], Arlo C, PA-C  ondansetron (ZOFRAN) 4 MG tablet Take 1 tablet (4 mg total) by mouth every 6 (six) hours as needed for nausea or vomiting. 10/05/19  Yes Oscar La, Arlo C, PA-C  polyethylene glycol (MIRALAX / GLYCOLAX) 17 g packet Take 17 g by mouth daily.   Yes [provider]  tiZANidine (ZANAFLEX) 2 MG tablet Take 2 mg by mouth every 6 (six) hours as needed for muscle spasms.   Yes [provider]  Multiple Vitamin (MULTIVITAMIN WITH MINERALS) TABS tablet Take 1 tablet by mouth daily. 12/29/19   Georgette Shell, MD  nitroGLYCERIN (NITROSTAT) 0.4 MG SL tablet Place 1 tablet (0.4 mg total) under the tongue every 5 (five) minutes x 3 doses as needed for chest pain. 04/22/20   Colon Branch, MD  valACYclovir (VALTREX) 500 MG tablet TAKE 2 TABLETS BY MOUTH AT ONSET OF RASH AND 2 TABLETS 12 HOURS LATER AS NEEDED 04/29/20   [place],  Holli Humbles, NP     Family History  Problem Relation Age of Onset  . Breast cancer Mother   . Heart disease Father        MIs  . Colon cancer Other        cousin  . Breast cancer Sister   . Leukemia Brother        GM    Social History   Socioeconomic History  . Marital status: Widowed    Spouse name: Not on file  . Number of children: 3  . Years of education: Not on file  . Highest education level: Not on file  Occupational History  . Occupation: n/a  Tobacco Use  . Smoking status: Former Smoker    Packs/day: 1.00    Years: 37.00    Pack years: 37.00    Types: Cigarettes    Start date: 06/26/1949    Quit date: 05/14/1988    Years since quitting: 32.1  . Smokeless tobacco: Never Used  . Tobacco comment: quit 25 years ago  Vaping Use  . Vaping Use: Never used  Substance and Sexual Activity  . Alcohol use: Yes    Alcohol/week: 1.0 standard drink    Types: 1 Glasses of wine per week    Comment: occasional/social  . Drug use: No  . Sexual activity: Not Currently  Other Topics Concern  . Not on file  Social History Narrative   Lost husband 07/29/07-    Lives in [place] in a town house by herself, has 3 daughters, lost Mickel Baas [phone])   [place] ( [place]     Not driving as of [phone]            Social Determinants of Health   Financial Resource Strain: Not on file  Food Insecurity: Not on file  Transportation Needs: Not on file  Physical Activity: Not on file  Stress: Not on file  Social Connections: Not on file     Review of Systems: A 12 point ROS discussed and pertinent positives are indicated in the HPI above.  All other systems are negative.  Review of Systems  Constitutional: Negative for fatigue and fever.  Respiratory: Negative for cough and shortness of breath.   Cardiovascular: Negative for chest pain.  Gastrointestinal: Positive for nausea and vomiting. Negative for abdominal pain.  Genitourinary: Negative for  difficulty urinating and dysuria.  Musculoskeletal: Positive  for back pain (has cane from home).  Neurological: Negative for dizziness, weakness, numbness and headaches.  Psychiatric/Behavioral: Negative for behavioral problems and confusion.    Vital Signs: BP 133/70   Pulse 77   Temp 98.4 F (36.9 C)   Ht 5\' 6"  (1.676 m)   Wt 120 lb (54.4 kg)   SpO2 96%   BMI 19.37 kg/m   Physical Exam Vitals and nursing note reviewed.  Constitutional:      General: She is not in acute distress.    Appearance: Normal appearance. She is not ill-appearing.  HENT:     Mouth/Throat:     Mouth: Mucous membranes are moist.     Pharynx: Oropharynx is clear.  Cardiovascular:     Rate and Rhythm: Normal rate and regular rhythm.  Pulmonary:     Effort: Pulmonary effort is normal. No respiratory distress.     Breath sounds: Normal breath sounds.  Abdominal:     General: Abdomen is flat.     Palpations: Abdomen is soft.  Skin:    General: Skin is warm and dry.  Neurological:     General: No focal deficit present.     Mental Status: She is alert and oriented to person, place, and time. Mental status is at baseline.  Psychiatric:        Mood and Affect: Mood normal.        Behavior: Behavior normal.        Thought Content: Thought content normal.        Judgment: Judgment normal.      MD Evaluation Airway: WNL Heart: WNL Abdomen: WNL Chest/ Lungs: WNL ASA  Classification: 3 Mallampati/Airway Score: Two   Imaging: CT Thoracic Spine Wo Contrast  Result Date: 07/04/2020 CLINICAL DATA:  Back pain with compression fracture EXAM: CT THORACIC AND LUMBAR SPINE WITHOUT CONTRAST TECHNIQUE: Multidetector CT imaging of the thoracic and lumbar spine was performed without contrast. Multiplanar CT image reconstructions were also generated. COMPARISON:  Lumbar spine CT 11/04/2019 FINDINGS: CT THORACIC SPINE FINDINGS Alignment: Increased thoracic kyphosis.  No static subluxation. Vertebrae: There  augmented compression fractures at T5 and T10 with a new compression fracture of T8. At T9, there is approximately 50% height loss with 4 mm of retropulsion. Height loss at T5 and T10 is unchanged. Paraspinal and other soft tissues: Calcific aortic atherosclerosis. Hiatal hernia. Calcified mediastinal and right hilar lymph nodes. Disc levels: There is mild spinal canal narrowing at the T9 level due to retropulsion. Otherwise, spinal canal patency is preserved. CT LUMBAR SPINE FINDINGS Segmentation: There are conflicting numbering schemes on prior studies. There are 5 non-rib-bearing lumbar vertebral bodies. The augmented level is considered L2. Alignment: Grade 1 L5-S1 anterolisthesis is unchanged. Vertebrae: Prior vertebral augmentation at L2 without progressive height loss. Unchanged chronic height loss at L3 and L4. No acute fracture. Paraspinal and other soft tissues: Calcific aortic atherosclerosis. Disc levels: L1-2: Unremarkable. L2-3: Mild spinal canal narrowing, unchanged. L3-4: Unremarkable. L4-5: Disc space narrowing without high-grade stenosis. L5-S1: Facet hypertrophy with grade 1 anterolisthesis. No high-grade stenosis. IMPRESSION: 1. New compression fracture of T9 with approximately 50% height loss and 4 mm of retropulsion. Mild spinal canal narrowing. 2. Unchanged appearance of augmented compression fractures at T5 and T10. 3. Prior vertebral augmentation at L2 without progressive height loss. 4. Unchanged chronic height loss at L3 and L4. Aortic Atherosclerosis (ICD10-I70.0). Electronically Signed   By: Ulyses Jarred M.D.   On: 07/04/2020 20:55   CT Lumbar Spine Wo Contrast  Result Date: 07/04/2020 CLINICAL DATA:  Back pain with compression fracture EXAM: CT THORACIC AND LUMBAR SPINE WITHOUT CONTRAST TECHNIQUE: Multidetector CT imaging of the thoracic and lumbar spine was performed without contrast. Multiplanar CT image reconstructions were also generated. COMPARISON:  Lumbar spine CT 11/04/2019  FINDINGS: CT THORACIC SPINE FINDINGS Alignment: Increased thoracic kyphosis.  No static subluxation. Vertebrae: There augmented compression fractures at T5 and T10 with a new compression fracture of T8. At T9, there is approximately 50% height loss with 4 mm of retropulsion. Height loss at T5 and T10 is unchanged. Paraspinal and other soft tissues: Calcific aortic atherosclerosis. Hiatal hernia. Calcified mediastinal and right hilar lymph nodes. Disc levels: There is mild spinal canal narrowing at the T9 level due to retropulsion. Otherwise, spinal canal patency is preserved. CT LUMBAR SPINE FINDINGS Segmentation: There are conflicting numbering schemes on prior studies. There are 5 non-rib-bearing lumbar vertebral bodies. The augmented level is considered L2. Alignment: Grade 1 L5-S1 anterolisthesis is unchanged. Vertebrae: Prior vertebral augmentation at L2 without progressive height loss. Unchanged chronic height loss at L3 and L4. No acute fracture. Paraspinal and other soft tissues: Calcific aortic atherosclerosis. Disc levels: L1-2: Unremarkable. L2-3: Mild spinal canal narrowing, unchanged. L3-4: Unremarkable. L4-5: Disc space narrowing without high-grade stenosis. L5-S1: Facet hypertrophy with grade 1 anterolisthesis. No high-grade stenosis. IMPRESSION: 1. New compression fracture of T9 with approximately 50% height loss and 4 mm of retropulsion. Mild spinal canal narrowing. 2. Unchanged appearance of augmented compression fractures at T5 and T10. 3. Prior vertebral augmentation at L2 without progressive height loss. 4. Unchanged chronic height loss at L3 and L4. Aortic Atherosclerosis (ICD10-I70.0). Electronically Signed   By: Ulyses Jarred M.D.   On: 07/04/2020 20:55    Labs:  CBC: Recent Labs    04/22/20 1444 04/29/20 1447 07/11/20 1642 07/13/20 0944  WBC 4.2 5.3 4.8 4.6  HGB 15.1 14.6 16.1* 16.0*  HCT 44.9 45.8 50.9* 49.3*  PLT 358 371 358 358    COAGS: Recent Labs    09/14/19 0424  11/04/19 1700 12/24/19 1813  INR 1.0 1.1 1.1  APTT  --  34  --     BMP: Recent Labs    12/24/19 0813 12/25/19 0313 12/26/19 0252 12/27/19 0307 03/04/20 0944 04/22/20 1444 04/29/20 1447 07/04/20 1847 07/11/20 1642  NA 136 136 127* 133* 139 139 137 135 137  K 3.9 3.9 4.1 3.2* 4.1 4.9 4.4 4.3 4.5  CL 99 103 96* 101 101 102 100 97* 99  CO2 28 28 25 27 30 30 32 29 27   GLUCOSE 162* 126* 157* 105* 103* 116* 111* 104* 102*  BUN 9 12 9 8  <5* 8 11 8 13   CALCIUM 8.9 8.3* 7.9* 8.2* 9.5 9.9 10.0 9.6 9.8  CREATININE 0.42* 0.37* <0.30* <0.30* 0.43* 0.55* 0.61 0.64 0.52  GFRNONAA >60 >60 NOT CALCULATED NOT CALCULATED >60  --  >60 >60 >60  GFRAA >60 >60 NOT CALCULATED NOT CALCULATED  --   --   --   --   --     LIVER FUNCTION TESTS: Recent Labs    11/11/19 0425 12/24/19 0813 12/26/19 0252 04/29/20 1447  BILITOT 1.6* 1.8* 1.7* 1.1  AST 20 22 22 20   ALT 11 16 12 12   ALKPHOS 60 74 56 82  PROT 5.7* 5.8* 5.0* 6.9  ALBUMIN 2.7* 3.3* 2.5* 3.9    TUMOR MARKERS: No results for input(s): AFPTM, CEA, CA199, CHROMGRNA in the last 8760 hours.  Assessment and Plan: Patient with past  medical history of T4, T9, and L2 compression fractures treated with vertebroplasty/kyphoplasty presents with complaint of additional compression fracture at T8.  She does endorse acute back pain which is not improving.  She is requiring narcotic pain medication to manage.     Case reviewed by Dr. Karenann Cai who approves patient for procedure.  Patient presents today in their usual state of health. She does endorse nausea and typically takes zofran as well as multiple antacids at home. Given 4mg  zofran IV today.  She has been NPO and is not currently on blood thinners.   Lengthy discussion held with daughter over the phone regarding recovery post-procedure today.  Daughter is primary/sole caretaker for both patient and her husband both of whom have medical concerns today.  She is aware that admission for  recovery is not an option as bed control was contacted and no beds are available to recover after this elective, outpatient-based procedure.  She was offered rescheduling for Friday at 11:30, however after discussion daughter feels this procedure is urgent and will prioritize her availability this afternoon. She assures me she will be available to assist patient home and will be able to monitor her for the recovery period.   Risks and benefits of vertebroplasty/kyphoplasty were discussed with the patient including, but not limited to education regarding the natural healing process of compression fractures without intervention, bleeding, infection, cement migration which may cause spinal cord damage, paralysis, pulmonary embolism or even death.  This interventional procedure involves the use of X-rays and because of the nature of the planned procedure, it is possible that we will have prolonged use of X-ray fluoroscopy.  Potential radiation risks to you include (but are not limited to) the following: - A slightly elevated risk for cancer  several years later in life. This risk is typically less than 0.5% percent. This risk is low in comparison to the normal incidence of human cancer, which is 33% for women and 50% for men according to the [place]. - Radiation induced injury can include skin redness, resembling a rash, tissue breakdown / ulcers and hair loss (which can be temporary or permanent).   The likelihood of either of these occurring depends on the difficulty of the procedure and whether you are sensitive to radiation due to previous procedures, disease, or genetic conditions.   IF your procedure requires a prolonged use of radiation, you will be notified and given written instructions for further action.  It is your responsibility to monitor the irradiated area for the 2 weeks following the procedure and to notify your physician if you are concerned that you have suffered a  radiation induced injury.    All of the patient's questions were answered, patient is agreeable to proceed.  Consent signed and in chart.  Thank you for this interesting consult.  I greatly enjoyed meeting [name] and look forward to participating in their care.  A copy of this report was sent to the requesting provider on this date.  Electronically Signed: Docia Barrier, PA 07/13/2020, 12:12 PM   I spent a total of  30 Minutes   in face to face in clinical consultation, greater than 50% of which was counseling/coordinating care for T8 compression fracture.

## 2020-07-13 NOTE — Procedures (Signed)
INTERVENTIONAL NEURORADIOLOGY BRIEF POSTPROCEDURE NOTE  Fluoroscopy guided T8 core biopsy and kyphoplasty  Attending: Dr. Pedro Earls  Assistant: None  Diagnosis: Fragility fracture, T8  Access site: Percutaneous, bilateral transpedicular   Anesthesia: Moderate sedation  Medication used: 4 mg Versed IV; 200 mcg Fentanyl IV; 1g vancomycin.  Complications: None.  Estimated blood loss: Negligible.  Specimen: T8 core biopsy.  Findings: Compression fracture of T8 confirmed. Bipedicular access performed. One core biopsy sample obtained. Kyphoplasty subsequently performed.   The patient tolerated the procedure well without incident or complication and is in stable condition.

## 2020-07-14 ENCOUNTER — Ambulatory Visit (HOSPITAL_COMMUNITY): Payer: Medicare Other

## 2020-07-15 LAB — SURGICAL PATHOLOGY

## 2020-07-19 ENCOUNTER — Inpatient Hospital Stay: Payer: Medicare Other

## 2020-07-19 ENCOUNTER — Inpatient Hospital Stay (HOSPITAL_BASED_OUTPATIENT_CLINIC_OR_DEPARTMENT_OTHER): Payer: Medicare Other | Admitting: Family

## 2020-07-19 ENCOUNTER — Other Ambulatory Visit: Payer: Self-pay

## 2020-07-19 ENCOUNTER — Encounter: Payer: Self-pay | Admitting: Family

## 2020-07-19 ENCOUNTER — Inpatient Hospital Stay: Payer: Medicare Other | Attending: Hematology & Oncology

## 2020-07-19 VITALS — BP 129/61 | HR 56 | Temp 97.6°F | Resp 20 | Ht 66.0 in | Wt 121.4 lb

## 2020-07-19 DIAGNOSIS — Z853 Personal history of malignant neoplasm of breast: Secondary | ICD-10-CM | POA: Insufficient documentation

## 2020-07-19 DIAGNOSIS — D751 Secondary polycythemia: Secondary | ICD-10-CM

## 2020-07-19 DIAGNOSIS — D509 Iron deficiency anemia, unspecified: Secondary | ICD-10-CM | POA: Diagnosis not present

## 2020-07-19 DIAGNOSIS — D500 Iron deficiency anemia secondary to blood loss (chronic): Secondary | ICD-10-CM

## 2020-07-19 DIAGNOSIS — D45 Polycythemia vera: Secondary | ICD-10-CM | POA: Diagnosis not present

## 2020-07-19 LAB — CBC WITH DIFFERENTIAL (CANCER CENTER ONLY)
Abs Immature Granulocytes: 0.01 10*3/uL (ref 0.00–0.07)
Basophils Absolute: 0.1 10*3/uL (ref 0.0–0.1)
Basophils Relative: 1 %
Eosinophils Absolute: 0.1 10*3/uL (ref 0.0–0.5)
Eosinophils Relative: 2 %
HCT: 46 % (ref 36.0–46.0)
Hemoglobin: 14.7 g/dL (ref 12.0–15.0)
Immature Granulocytes: 0 %
Lymphocytes Relative: 13 %
Lymphs Abs: 0.7 10*3/uL (ref 0.7–4.0)
MCH: 32.2 pg (ref 26.0–34.0)
MCHC: 32 g/dL (ref 30.0–36.0)
MCV: 100.7 fL — ABNORMAL HIGH (ref 80.0–100.0)
Monocytes Absolute: 0.5 10*3/uL (ref 0.1–1.0)
Monocytes Relative: 10 %
Neutro Abs: 3.9 10*3/uL (ref 1.7–7.7)
Neutrophils Relative %: 74 %
Platelet Count: 344 10*3/uL (ref 150–400)
RBC: 4.57 MIL/uL (ref 3.87–5.11)
RDW: 13.1 % (ref 11.5–15.5)
WBC Count: 5.3 10*3/uL (ref 4.0–10.5)
nRBC: 0 % (ref 0.0–0.2)

## 2020-07-19 LAB — CMP (CANCER CENTER ONLY)
ALT: 9 U/L (ref 0–44)
AST: 16 U/L (ref 15–41)
Albumin: 4 g/dL (ref 3.5–5.0)
Alkaline Phosphatase: 93 U/L (ref 38–126)
Anion gap: 2 — ABNORMAL LOW (ref 5–15)
BUN: 9 mg/dL (ref 8–23)
CO2: 35 mmol/L — ABNORMAL HIGH (ref 22–32)
Calcium: 10.1 mg/dL (ref 8.9–10.3)
Chloride: 100 mmol/L (ref 98–111)
Creatinine: 0.69 mg/dL (ref 0.44–1.00)
GFR, Estimated: >60 mL/min (ref >60)
Glucose, Bld: 112 mg/dL — ABNORMAL HIGH (ref 70–99)
Potassium: 5.8 mmol/L — ABNORMAL HIGH (ref 3.5–5.1)
Sodium: 137 mmol/L (ref 135–145)
Total Bilirubin: 1 mg/dL (ref 0.3–1.2)
Total Protein: 6.6 g/dL (ref 6.5–8.1)

## 2020-07-19 LAB — LACTATE DEHYDROGENASE: LDH: 135 U/L (ref 98–192)

## 2020-07-19 MED FILL — VALACYCLOVIR HCL 500 MG TAB: 500 | 15 days supply | Qty: 30 | Fill #1

## 2020-07-19 NOTE — Progress Notes (Signed)
Hematology and Oncology Follow Up Visit  [name] [id number] 1930-01-01 87 y.o. 07/19/2020   Principle Diagnosis:  Polycythemia vera - JAK2 positive Stage I (T1bN0M0 ) infiltrating duct carcinoma the left breast  Iron deficiency anemia -- symptomatic  Past Therapy: Femara 2.5 mg by mouth daily - stopped 08/2015  Current Therapy: Phlebotomy to maintain hematocrit below 45% Hydrea 500 mg by mouth every other day-- re-startedon 07/16/2019 IV Iron as needed   Interim History:  [name] is here today for follow-up. She states that she has had 3 falls recently and sustained a compression fracture at T8. She had a kyphoplasty and bone biopsy last week on 3/2.  Bone biopsy was negative. She is still having 10/10 pain in the lower back along her waistline and is unsteady on her feet. She is ambulating with a cane for support but needs assistance when ambulating. We will escort her back downstairs to her car once she finishes her visit. Hct today is stable at 46%. She dropped from 49% last week post procedure. We will hold phlebotomy today.  She states that she is doing well on Hydrea and taking as prescribed.  No fever, chills, n/v, cough, rash, dizziness, SOB, chest pain, palpitations, abdominal pain or changes in bowel or bladder habits.  She has not noted any blood loss. She does bruise easily on her arms on aspirin.   No swelling, numbness or tingling in her extremities at this time.  She states that she has no appetite but makes herself eat. She drinks one Boost a day. She does feel that she is doing her best to hydrate. Her weight is stable at 121 lbs.   ECOG Performance Status: 1 - Symptomatic but completely ambulatory  Medications:  Allergies as of 07/19/2020      Reactions   Penicillins Hives, Rash, Other (See Comments)   Has patient had a PCN reaction causing immediate rash, facial/tongue/throat swelling, SOB or lightheadedness with hypotension: Yes Has  patient had a PCN reaction causing severe rash involving mucus membranes or skin necrosis: yes Has patient had a PCN reaction that required hospitalization: No Has patient had a PCN reaction occurring within the last 10 years: no If all of the above answers are "NO", then may proceed with Cephalosporin use. Other Reaction: OTHER REACTION   Valsartan Itching, Other (See Comments)   Atorvastatin Diarrhea   Carvedilol Other (See Comments)   "teeth hurt when I took it"   Ezetimibe Nausea Only   Fluvastatin Sodium Other (See Comments)   "Can't remember"   Magnesium Hydroxide Other (See Comments)   REACTION: triggers HAs   Meloxicam Other (See Comments)   Pt seeing auro's / spots.     Pneumovax [pneumococcal Polysaccharide Vaccine] Other (See Comments)   Local reaction   Quinapril Hcl Other (See Comments)    "feelings of tiredness"   Simvastatin Diarrhea   Topamax [topiramate] Itching   Vit D-vit E-safflower Oil Other (See Comments)   Headaches      Medication List       Accurate as of July 19, 2020  2:29 PM. If you have any questions, ask your nurse or doctor.        acetaminophen 500 MG tablet Commonly known as: TYLENOL Take 1,000 mg by mouth every 8 (eight) hours as needed for mild pain or moderate pain. NTE 6 tabs/24 hours   alendronate 70 MG tablet Commonly known as: FOSAMAX Take 1 tablet (70 mg total) by mouth every 7 (seven) days.  Take with a full glass of water on an empty stomach.   aspirin 81 MG EC tablet Take 1 tablet (81 mg total) by mouth daily. Swallow whole.   bisacodyl 10 MG suppository Commonly known as: DULCOLAX Place 1 suppository (10 mg total) rectally daily as needed for moderate constipation.   bismuth subsalicylate 785 YI/50YD suspension Commonly known as: PEPTO BISMOL Take 30 mLs by mouth every 4 (four) hours as needed for indigestion (heartburn).   cyclobenzaprine 5 MG tablet Commonly known as: FLEXERIL Take 1 tablet (5 mg total) by mouth 3  (three) times daily as needed for muscle spasms.   Dermacloud Crea Apply 1 application topically See admin instructions. Apply to buttocks topically every shift for rash   docusate sodium 100 MG capsule Commonly known as: COLACE Take 100 mg by mouth 2 (two) times daily.   furosemide 20 MG tablet Commonly known as: Lasix Take 1 tablet (20 mg total) by mouth daily.   hydroxyurea 500 MG capsule Commonly known as: HYDREA Take 1 capsule (500 mg total) by mouth every other day. May take with food to minimize GI side effects.   lactose free nutrition Liqd Take 237 mLs by mouth See admin instructions. Every shift   levETIRAcetam 250 MG tablet Commonly known as: Keppra Take 1 tablet (250 mg total) by mouth 2 (two) times daily.   Lidocaine 4 % Ptch LIDOCAINE 4% PATCH :Apply 1 patch topically daily and remove after 12 hours to left lateral chest What changed:   how much to take  how to take this  when to take this  additional instructions   LORazepam 0.5 MG tablet Commonly known as: ATIVAN TAKE 1/2 TABLET BY MOUTH EVERY MORNING AND EVERY AFTERNOON THEN TAKE 2 TABLETS BY MOUTH AT BEDTIME AS NEEDED   metoprolol tartrate 50 MG tablet Commonly known as: LOPRESSOR Take 1 tablet (50 mg total) by mouth 2 (two) times daily.   multivitamin with minerals Tabs tablet Take 1 tablet by mouth daily.   nitroGLYCERIN 0.4 MG SL tablet Commonly known as: NITROSTAT Place 1 tablet (0.4 mg total) under the tongue every 5 (five) minutes x 3 doses as needed for chest pain.   omeprazole 20 MG capsule Commonly known as: PRILOSEC Take 1 capsule (20 mg total) by mouth daily.   ondansetron 4 MG tablet Commonly known as: ZOFRAN Take 1 tablet (4 mg total) by mouth every 6 (six) hours as needed for nausea or vomiting.   polyethylene glycol 17 g packet Commonly known as: MIRALAX / GLYCOLAX Take 17 g by mouth daily.   tiZANidine 2 MG tablet Commonly known as: ZANAFLEX Take 2 mg by mouth every 6  (six) hours as needed for muscle spasms.   valACYclovir 500 MG tablet Commonly known as: VALTREX TAKE 2 TABLETS BY MOUTH AT ONSET OF RASH AND 2 TABLETS 12 HOURS LATER AS NEEDED       Allergies:  Allergies  Allergen Reactions  . Penicillins Hives, Rash and Other (See Comments)    Has patient had a PCN reaction causing immediate rash, facial/tongue/throat swelling, SOB or lightheadedness with hypotension: Yes Has patient had a PCN reaction causing severe rash involving mucus membranes or skin necrosis: yes Has patient had a PCN reaction that required hospitalization: No Has patient had a PCN reaction occurring within the last 10 years: no If all of the above answers are "NO", then may proceed with Cephalosporin use.  Other Reaction: OTHER REACTION  . Valsartan Itching and Other (See Comments)  .  Atorvastatin Diarrhea  . Carvedilol Other (See Comments)    "teeth hurt when I took it"   . Ezetimibe Nausea Only  . Fluvastatin Sodium Other (See Comments)    "Can't remember"  . Magnesium Hydroxide Other (See Comments)    REACTION: triggers HAs  . Meloxicam Other (See Comments)    Pt seeing auro's / spots.    . Pneumovax [Pneumococcal Polysaccharide Vaccine] Other (See Comments)    Local reaction  . Quinapril Hcl Other (See Comments)     "feelings of tiredness"  . Simvastatin Diarrhea  . Topamax [Topiramate] Itching  . Vit D-Vit E-Safflower Oil Other (See Comments)    Headaches    Past Medical History, Surgical history, Social history, and Family History were reviewed and updated.  Review of Systems: All other 10 point review of systems is negative.   Physical Exam:  height is 5\' 6"  (1.676 m) and weight is 121 lb 6.4 oz (55.1 kg). Her oral temperature is 97.6 F (36.4 C). Her blood pressure is 129/61 and her pulse is 56 (abnormal). Her respiration is 20 and oxygen saturation is 98%.   Wt Readings from Last 3 Encounters:  07/19/20 121 lb 6.4 oz (55.1 kg)  07/13/20 120 lb  (54.4 kg)  06/08/20 121 lb (54.9 kg)    Ocular: Sclerae unicteric, pupils equal, round and reactive to light Ear-nose-throat: Oropharynx clear, dentition fair Lymphatic: No cervical, supraclavicular or axillary adenopathy Lungs no rales or rhonchi, good excursion bilaterally Heart regular rate and rhythm, no murmur appreciated Abd soft, nontender, positive bowel sounds MSK no focal spinal tenderness, no joint edema Neuro: non-focal, well-oriented, appropriate affect Breasts: Deferred   Lab Results  Component Value Date   WBC 5.3 07/19/2020   HGB 14.7 07/19/2020   HCT 46.0 07/19/2020   MCV 100.7 (H) 07/19/2020   PLT 344 07/19/2020   Lab Results  Component Value Date   FERRITIN 65 04/29/2020   IRON 99 04/29/2020   TIBC 339 04/29/2020   UIBC 239 04/29/2020   IRONPCTSAT 29 04/29/2020   Lab Results  Component Value Date   RETICCTPCT 0.8 02/02/2015   RBC 4.57 07/19/2020   RETICCTABS 41.4 02/02/2015   No results found for: KPAFRELGTCHN, LAMBDASER, KAPLAMBRATIO No results found for: IGGSERUM, IGA, IGMSERUM No results found for: Odetta Pink, SPEI   Chemistry      Component Value Date/Time   NA 137 07/19/2020 1314   NA 142 03/25/2017 1457   NA 139 04/10/2016 0855   K 5.8 (H) 07/19/2020 1314   K 4.1 03/25/2017 1457   K 4.2 04/10/2016 0855   CL 100 07/19/2020 1314   CL 96 (L) 03/25/2017 1457   CO2 35 (H) 07/19/2020 1314   CO2 32 03/25/2017 1457   CO2 29 04/10/2016 0855   BUN 9 07/19/2020 1314   BUN 7 03/25/2017 1457   BUN 10.0 04/10/2016 0855   CREATININE 0.69 07/19/2020 1314   CREATININE 0.55 (L) 04/22/2020 1444   CREATININE 0.8 04/10/2016 0855      Component Value Date/Time   CALCIUM 10.1 07/19/2020 1314   CALCIUM 9.8 03/25/2017 1457   CALCIUM 9.5 04/10/2016 0855   ALKPHOS 93 07/19/2020 1314   ALKPHOS 86 (H) 03/25/2017 1457   ALKPHOS 85 04/10/2016 0855   AST 16 07/19/2020 1314   AST 19 04/10/2016 0855    ALT 9 07/19/2020 1314   ALT 15 03/25/2017 1457   ALT 13 04/10/2016 0855   BILITOT 1.0 07/19/2020  1314   BILITOT 1.26 (H) 04/10/2016 0855       Impression and Plan: Ms. Doster is a very pleasant 87 yo caucasian female with polycythemia and history of stage I infiltrating ductal carcinoma of the left breast with lumpectomy in 2013as well as polycythemia, JAK2 positive.  She is doing well on Hydrea and her counts are stable. She will continue her same regimen.  No phlebotomy needed at this time. We will just continue to monitor.  She was advised to follow-up with her PCP regarding continued lower back pain.  Follow-up in another 8 weeks.  She was encouraged to contact our office with any questions or concerns.   [name], NP 3/8/20222:29 PM

## 2020-07-20 ENCOUNTER — Telehealth: Payer: Self-pay | Admitting: Internal Medicine

## 2020-07-20 ENCOUNTER — Telehealth: Payer: Self-pay

## 2020-07-20 DIAGNOSIS — E875 Hyperkalemia: Secondary | ICD-10-CM

## 2020-07-20 LAB — IRON AND TIBC
Iron: 42 ug/dL (ref 41–142)
Saturation Ratios: 12 % — ABNORMAL LOW (ref 21–57)
TIBC: 358 ug/dL (ref 236–444)
UIBC: 315 ug/dL (ref 120–384)

## 2020-07-20 LAB — FERRITIN: Ferritin: 50 ng/mL (ref 11–307)

## 2020-07-20 NOTE — Telephone Encounter (Signed)
Spoke w/ Pt- informed of results. She does not take any OTC potassium supplements. Lab appt scheduled 07/25/20 at 2pm to recheck potassium.

## 2020-07-20 NOTE — Telephone Encounter (Signed)
appts made per 07/19/20 los and a calendar will be mailed to the pt   [name]

## 2020-07-20 NOTE — Telephone Encounter (Signed)
Labs ordered by hematology showed a potassium no 5.8, she is not on potassium supplements or ACE/RB's, she is on Lasix. Advised patient: Avoid any over-the-counter potassium supplements if she is taking them. Recheck a BMP in 3 to 4 days. ,

## 2020-07-22 ENCOUNTER — Telehealth: Payer: Self-pay

## 2020-07-22 NOTE — Telephone Encounter (Signed)
S/w pt and she is aware of her iron appts per sch message   [name]

## 2020-07-25 ENCOUNTER — Encounter: Payer: Self-pay | Admitting: Internal Medicine

## 2020-07-25 ENCOUNTER — Other Ambulatory Visit (INDEPENDENT_AMBULATORY_CARE_PROVIDER_SITE_OTHER): Payer: Medicare Other

## 2020-07-25 ENCOUNTER — Ambulatory Visit (INDEPENDENT_AMBULATORY_CARE_PROVIDER_SITE_OTHER): Payer: Medicare Other | Admitting: Internal Medicine

## 2020-07-25 ENCOUNTER — Telehealth: Payer: Self-pay | Admitting: Internal Medicine

## 2020-07-25 ENCOUNTER — Other Ambulatory Visit: Payer: Self-pay

## 2020-07-25 VITALS — BP 148/90 | HR 68 | Temp 97.6°F | Resp 18 | Ht 66.0 in | Wt 119.2 lb

## 2020-07-25 DIAGNOSIS — E875 Hyperkalemia: Secondary | ICD-10-CM | POA: Diagnosis not present

## 2020-07-25 DIAGNOSIS — Z9119 Patient's noncompliance with other medical treatment and regimen: Secondary | ICD-10-CM | POA: Diagnosis not present

## 2020-07-25 DIAGNOSIS — S22070D Wedge compression fracture of T9-T10 vertebra, subsequent encounter for fracture with routine healing: Secondary | ICD-10-CM | POA: Diagnosis not present

## 2020-07-25 DIAGNOSIS — R079 Chest pain, unspecified: Secondary | ICD-10-CM

## 2020-07-25 DIAGNOSIS — Z91199 Patient's noncompliance with other medical treatment and regimen due to unspecified reason: Secondary | ICD-10-CM

## 2020-07-25 MED ORDER — NITROGLYCERIN 0.4 MG SL SUBL: 0.4000 mg | SUBLINGUAL_TABLET | SUBLINGUAL | 3 refills | Status: DC | PRN

## 2020-07-25 MED ORDER — HYDROCODONE-ACETAMINOPHEN 5-325 MG PO TABS: 1.0000 | ORAL_TABLET | Freq: Two times a day (BID) | ORAL | 0 refills | Status: DC | PRN

## 2020-07-25 NOTE — Telephone Encounter (Signed)
Spoke w/ Pt- okay to speak w/ daughter Early Chars.

## 2020-07-25 NOTE — Telephone Encounter (Signed)
Spoke with the patient's daughter, conversation documentation today's note

## 2020-07-25 NOTE — Telephone Encounter (Signed)
Please call patient and ask if she authorized [place] to talk with one of her daughters

## 2020-07-25 NOTE — Telephone Encounter (Signed)
Patient's daughter would like to speak to someone in regards to her mother visit. She would like to know the findings

## 2020-07-25 NOTE — Progress Notes (Signed)
Subjective:    Patient ID: [name], female    DOB: 09-16-1929, 87 y.o.   MRN: [id number]  DOS:  07/25/2020 Type of visit - description: Back pain Here due to ongoing back pain. Also needs her potassium rechecked. Also, yesterday woke up with chest pain, at rest, in the middle of the chest without radiation. No associated nausea, diaphoresis, shortness of breath. CP  got better only after she took 3 nitroglycerin, overall the pain lasted for about an hour.    Review of Systems See above   Past Medical History:  Diagnosis Date  . CAD (coronary artery disease)    mild nonobstructive disease on cath in 2003  . Cancer ([place])   . Cardiomyopathy    Probable Takotsubo, severe CP w/ normal cath in 1994. Severe CP in 2003 w/ widespread T wave inversions on ECG. Cath w/ minimal coronary disease but LV-gram showed periapical severe hypokinesis and basilar hyperkinesia (EF 40%). Last echo in 4/09 confirmed full LV functional recovery with EF 60%, no regional wall motion abnormalities, mild to moderate LVH.  Marland Kitchen Chronic diastolic CHF (congestive heart failure) ([place]) 09/13/2019  . CVA (cerebral infarction)   . Depression with anxiety 03-29-11   lost husband 3'09  . Diverticulitis   . DVT (deep venous thrombosis) ([place])    after venous ablation, R leg  . Dyspnea   . E. coli gastroenteritis 03-29-11   8'10  . Fracture 09/10/07   L2, status post vertebroplasty of L2 performed by IR  . GERD (gastroesophageal reflux disease)   . Headache(784.0)    migraines  . Hemorrhoids   . Hiatal hernia 03-29-11   no nerve problems  . Hyperlipidemia   . Hyperplastic colon polyp 06/2001  . Hypertension   . Iron deficiency anemia due to chronic blood loss 12/30/2017  . OA (osteoarthritis) of knee 03-29-11   w/ bilateral knee pain-not a problem now  . OSA (obstructive sleep apnea) 03-29-11   no cpap used, not a problem now.  . Osteoporosis   . Otalgia of both ears    Dr. Simeon Craft  . Polycythemia    . Stroke [facility]) 03-29-11   CVA x2 -last 10'12-?TIA(visual problems)  . Varicose vein     Past Surgical History:  Procedure Laterality Date  . APPENDECTOMY    . BACK SURGERY    . BREAST BIOPSY    . BREAST CYST EXCISION    . BREAST LUMPECTOMY Left 2013   left stage I left breast cancer  . CHOLECYSTECTOMY  04/09/2011   Procedure: LAPAROSCOPIC CHOLECYSTECTOMY WITH INTRAOPERATIVE CHOLANGIOGRAM;  Surgeon: Odis Hollingshead, MD;  Location: [facility];  Service: General;  Laterality: N/A;  . Dental Extraction     L maxillary molar  . INTRAMEDULLARY (IM) NAIL INTERTROCHANTERIC Left 12/25/2019   Procedure: INTRAMEDULLARY (IM) NAIL INTERTROCHANTRIC;  Surgeon: Paralee Cancel, MD;  Location: [facility];  Service: Orthopedics;  Laterality: Left;  . IR KYPHO THORACIC WITH BONE BIOPSY  09/15/2019  . IR KYPHO THORACIC WITH BONE BIOPSY  07/13/2020      . IR KYPHO THORACIC WITH BONE BIOPSY  07/13/2020  . IR VERTEBROPLASTY CERV/THOR BX INC UNI/BIL INC/INJECT/IMAGING  11/10/2019  . KYPHOSIS SURGERY  08/2007   cement used  . OVARIAN CYST SURGERY     left  . RADIOLOGY WITH ANESTHESIA N/A 11/10/2019   Procedure: VERTEBROPLASTY;  Surgeon: Luanne Bras, MD;  Location: [facility];  Service: Radiology;  Laterality: N/A;  . TONSILLECTOMY    . TOTAL KNEE  ARTHROPLASTY  06/2010   left  . TUBAL LIGATION      Allergies as of 07/25/2020      Reactions   Penicillins Hives, Rash, Other (See Comments)   Has patient had a PCN reaction causing immediate rash, facial/tongue/throat swelling, SOB or lightheadedness with hypotension: Yes Has patient had a PCN reaction causing severe rash involving mucus membranes or skin necrosis: yes Has patient had a PCN reaction that required hospitalization: No Has patient had a PCN reaction occurring within the last 10 years: no If all of the above answers are "NO", then may proceed with Cephalosporin use. Other Reaction: OTHER REACTION   Valsartan Itching, Other (See Comments)   Atorvastatin  Diarrhea   Carvedilol Other (See Comments)   "teeth hurt when I took it"   Ezetimibe Nausea Only   Fluvastatin Sodium Other (See Comments)   "Can't remember"   Magnesium Hydroxide Other (See Comments)   REACTION: triggers HAs   Meloxicam Other (See Comments)   Pt seeing auro's / spots.     Pneumovax [pneumococcal Polysaccharide Vaccine] Other (See Comments)   Local reaction   Quinapril Hcl Other (See Comments)    "feelings of tiredness"   Simvastatin Diarrhea   Topamax [topiramate] Itching   Vit D-vit E-safflower Oil Other (See Comments)   Headaches      Medication List       Accurate as of July 25, 2020 11:59 PM. If you have any questions, ask your nurse or doctor.        STOP taking these medications   acetaminophen 500 MG tablet Commonly known as: TYLENOL Stopped by: Kathlene November, MD     TAKE these medications   alendronate 70 MG tablet Commonly known as: FOSAMAX Take 1 tablet (70 mg total) by mouth every 7 (seven) days. Take with a full glass of water on an empty stomach.   aspirin 81 MG EC tablet Take 1 tablet (81 mg total) by mouth daily. Swallow whole.   bisacodyl 10 MG suppository Commonly known as: DULCOLAX Place 1 suppository (10 mg total) rectally daily as needed for moderate constipation.   bismuth subsalicylate 614 ER/15QM suspension Commonly known as: PEPTO BISMOL Take 30 mLs by mouth every 4 (four) hours as needed for indigestion (heartburn).   cyclobenzaprine 5 MG tablet Commonly known as: FLEXERIL Take 1 tablet (5 mg total) by mouth 3 (three) times daily as needed for muscle spasms.   Dermacloud Crea Apply 1 application topically See admin instructions. Apply to buttocks topically every shift for rash   docusate sodium 100 MG capsule Commonly known as: COLACE Take 100 mg by mouth 2 (two) times daily.   furosemide 20 MG tablet Commonly known as: Lasix Take 1 tablet (20 mg total) by mouth daily.   HYDROcodone-acetaminophen 5-325 MG  tablet Commonly known as: NORCO/VICODIN Take 1 tablet by mouth 2 (two) times daily as needed for moderate pain (pain score 4-6). Started by: Kathlene November, MD   hydroxyurea 500 MG capsule Commonly known as: HYDREA Take 1 capsule (500 mg total) by mouth every other day. May take with food to minimize GI side effects.   lactose free nutrition Liqd Take 237 mLs by mouth See admin instructions. Every shift   levETIRAcetam 250 MG tablet Commonly known as: Keppra Take 1 tablet (250 mg total) by mouth 2 (two) times daily.   Lidocaine 4 % Ptch LIDOCAINE 4% PATCH :Apply 1 patch topically daily and remove after 12 hours to left lateral chest What changed:  how much to take  how to take this  when to take this  additional instructions   LORazepam 0.5 MG tablet Commonly known as: ATIVAN TAKE 1/2 TABLET BY MOUTH EVERY MORNING AND EVERY AFTERNOON THEN TAKE 2 TABLETS BY MOUTH AT BEDTIME AS NEEDED   metoprolol tartrate 50 MG tablet Commonly known as: LOPRESSOR Take 1 tablet (50 mg total) by mouth 2 (two) times daily.   multivitamin with minerals Tabs tablet Take 1 tablet by mouth daily.   nitroGLYCERIN 0.4 MG SL tablet Commonly known as: NITROSTAT Place 1 tablet (0.4 mg total) under the tongue every 5 (five) minutes x 3 doses as needed for chest pain.   omeprazole 20 MG capsule Commonly known as: PRILOSEC Take 1 capsule (20 mg total) by mouth daily.   ondansetron 4 MG tablet Commonly known as: ZOFRAN Take 1 tablet (4 mg total) by mouth every 6 (six) hours as needed for nausea or vomiting.   polyethylene glycol 17 g packet Commonly known as: MIRALAX / GLYCOLAX Take 17 g by mouth daily.   tiZANidine 2 MG tablet Commonly known as: ZANAFLEX Take 2 mg by mouth every 6 (six) hours as needed for muscle spasms.   valACYclovir 500 MG tablet Commonly known as: VALTREX TAKE 2 TABLETS BY MOUTH AT ONSET OF RASH AND 2 TABLETS 12 HOURS LATER AS NEEDED          Objective:   Physical  Exam BP (!) 148/90 (BP Location: Right Arm, Patient Position: Sitting, Cuff Size: Small)   Pulse 68   Temp 97.6 F (36.4 C) (Oral)   Resp 18   Ht 5\' 6"  (1.676 m)   Wt 119 lb 4 oz (54.1 kg)   BMI 19.25 kg/m  General:   Well developed, frail elderly lady, BMI noted. HEENT:  Normocephalic . Face symmetric, atraumatic Lungs:  CTA B Normal respiratory effort, no intercostal retractions, no accessory muscle use. Heart: RRR,  no murmur.  Lower extremities: no pretibial edema bilaterally  Skin: Not pale. Not jaundice Neurologic:  alert & oriented X3.  Speech normal, gait appropriate for age, assisted by a cane. Psych--  Cognition and judgment appear intact.  Cooperative with normal attention span and concentration.  Behavior appropriate. No anxious or depressed appearing.      Assessment      Assessment  HTN Dyslipidemia: Intolerant to most medicines including Zetia, Pravachol, Niaspan Depression, anxiety , insomnia - on ativan bid (03-2015 citalopram cause drowsiness, 04-2015 prozac:diarrhea) Hem-Onc: POLYCYTEMIA: sees hem-onc H/o breast ca  MSK: ---DJD :  Tylenol #3, uses rarely ---Osteoporosis:  T score -2.8 (02-2013): declined treatment 06-2017 ---L2 fractures, s/p vertebral plasty 2009, s/p vertebral plasty T 8 07/2020 Migraine headaches - Tylenol #3 (rarely)  GI: GERD- intolerant to prevacid, visual disturbances HH, diverticulitis, Colon polyps  h/o "functional " LLQ abd pain CV: ---Cardiomyopathy Probable Takotsubo cardiomyopathy: CP w/ normal cath in 1994.   Severe CP  2003 with widespread T wave inversions on ECG.  Cath with minimal coronary disease but LV-gram showed periapical severe hypokinesis and basilar hyperkinesia (EF 40%) >>> ECHO 4/09 confirmed full LV functional recovery with EF 60%, mild LVH. 2012: Nl EF per echo, (-) Lexiscan 05/21/18 ECHO Nl/stable ---Stroke / TIA 2012 ---ICB seizure 10/2019 was rx keppra, self d/c 05/2019  Sleep apnea 2012, , not on  CPAP H/oDVT, right leg after venous ablation Raynaud phenomena, DX 05/2018  PLAN Osteoporosis, T8 vertebral fracture: Acute onset of pain 06/21/2020, went to the ER, CT show a  fracture, had a kyphoplasty 07/14/2020. Reports pain is not well controlled, severe, "I cannot even seat for long time". I reviewed the chart, she still has 4 hydrocodones tablets at home, based on PDMP she has taken less than 1 tablet daily. Plan: Refer back to Ortho, rec to do take hydrocodone BID, watch for sedation.  Rx for additional hydrocodone sent. Polycythemia: Saw hematology 07/19/2020.  At that time potassium was 5.8, we are rechecking it today. Creatinine was okay, hemoglobin 14.7. Hyperkalemia: See above Chest pain: Had another episode of chest pain yesterday, today asymptomatic.  Previous chest pain last few months ago. Recommend to keep "fresh" NTG with her, Rx sent, refer back to cardiology.  Doubt she is a candidate for aggressive work-up, perhaps Imdur is an option. Social, compliance: We have report that home health visited her x2 to help with med compliance, I still think she is somewhat confused about her medication a, list was provided, see AVS Addendum: After the patient left and w/ the pt's  permission   I spoke with the patient's [name] : -All today's findings and plans discussed.   -[name] tells me that they have hired private help and [name] won't  let them help. -Patient has been verbally abusive with the daughter -[name] is completely opposed to going to a nursing home or even a independent living facility -The family has attempted to organize her medications multiple times, the last time was 1 week ago but the patient won't follow instructions. RTC 4 months  Today I spent 48 minutes with the patient and subsequently with her daughter discussing in detail medical, social noncompliant issues.  This visit occurred during the SARS-CoV-2 public health emergency.  Safety protocols were in  place, including screening questions prior to the visit, additional usage of staff PPE, and extensive cleaning of exam room while observing appropriate contact time as indicated for disinfecting solutions.

## 2020-07-25 NOTE — Patient Instructions (Addendum)
For pain: Please take one hydrocodone in the morning and one at bedtime.  I am sending a prescription Watch for excessive sedation  We are referring you back to the orthopedic doctors  For chest pain: We are referring you to Dr. Bettina Gavia. Take your nitroglycerin with you, remember this needs to be fresh nitroglycerin were sending a prescription. If you take 3 nitroglycerin and the pain does not go away: Call 911  Please take all your medications as prescribed, see the list provided.  If you are confused about the medications, we can tell the home health agency to see you again  or you can make an appointment and bring all your oral medicines .  We can organize them for you.  Follow-up appointment in 4 weeks.

## 2020-07-26 ENCOUNTER — Telehealth: Payer: Self-pay

## 2020-07-26 ENCOUNTER — Inpatient Hospital Stay: Payer: Medicare Other

## 2020-07-26 LAB — BASIC METABOLIC PANEL
BUN: 9 mg/dL (ref 6–23)
CO2: 29 mEq/L (ref 19–32)
Calcium: 9.9 mg/dL (ref 8.4–10.5)
Chloride: 99 mEq/L (ref 96–112)
Creatinine, Ser: 0.79 mg/dL (ref 0.40–1.20)
GFR: 66.53 mL/min (ref >60.00)
Glucose, Bld: 102 mg/dL — ABNORMAL HIGH (ref 70–99)
Potassium: 4.5 mEq/L (ref 3.5–5.1)
Sodium: 136 mEq/L (ref 135–145)

## 2020-07-26 NOTE — Telephone Encounter (Signed)
Pt called in and left a vm to cancel todays iron tx      [name]

## 2020-07-26 NOTE — Assessment & Plan Note (Signed)
Osteoporosis, T8 vertebral fracture: Acute onset of pain 06/21/2020, went to the ER, CT show a fracture, had a kyphoplasty 07/14/2020. Reports pain is not well controlled, severe, "I cannot even seat for long time". I reviewed the chart, she still has 4 hydrocodones tablets at home, based on PDMP she has taken less than 1 tablet daily. Plan: Refer back to Ortho, rec to do take hydrocodone BID, watch for sedation.  Rx for additional hydrocodone sent. Polycythemia: Saw hematology 07/19/2020.  At that time potassium was 5.8, we are rechecking it today. Creatinine was okay, hemoglobin 14.7. Hyperkalemia: See above Chest pain: Had another episode of chest pain yesterday, today asymptomatic.  Previous chest pain last few months ago. Recommend to keep "fresh" NTG with her, Rx sent, refer back to cardiology.  Doubt she is a candidate for aggressive work-up, perhaps Imdur is an option. Social, compliance: We have report that home health visited her x2 to help with med compliance, I still think she is somewhat confused about her medication a, list was provided, see AVS Addendum: After the patient left and w/ the pt's  permission   I spoke with the patient's daughter Early Chars : -All today's findings and plans discussed.   -Early Chars tells me that they have hired private help and [facility] won't  let them help. -Patient has been verbally abusive with the daughter -[place] is completely opposed to going to a nursing home or even a independent living facility -The family has attempted to organize her medications multiple times, the last time was 1 week ago but the patient won't follow instructions. RTC 4 months

## 2020-07-27 ENCOUNTER — Telehealth: Payer: Self-pay

## 2020-07-27 NOTE — Telephone Encounter (Signed)
Plan of care signed and faxed back to [facility] at [phone]. Form sent for scanning.

## 2020-07-29 ENCOUNTER — Other Ambulatory Visit: Payer: Self-pay

## 2020-07-29 ENCOUNTER — Inpatient Hospital Stay: Payer: Medicare Other

## 2020-07-29 VITALS — BP 118/54 | HR 58 | Temp 98.2°F | Resp 18

## 2020-07-29 DIAGNOSIS — D500 Iron deficiency anemia secondary to blood loss (chronic): Secondary | ICD-10-CM

## 2020-07-29 DIAGNOSIS — D45 Polycythemia vera: Secondary | ICD-10-CM | POA: Diagnosis not present

## 2020-07-29 DIAGNOSIS — D751 Secondary polycythemia: Secondary | ICD-10-CM

## 2020-07-29 DIAGNOSIS — D509 Iron deficiency anemia, unspecified: Secondary | ICD-10-CM | POA: Diagnosis not present

## 2020-07-29 DIAGNOSIS — Z853 Personal history of malignant neoplasm of breast: Secondary | ICD-10-CM | POA: Diagnosis not present

## 2020-07-29 MED ORDER — SODIUM CHLORIDE 0.9 % IV SOLN
INTRAVENOUS | Status: DC
  Filled 2020-07-29: qty 250

## 2020-07-29 MED ORDER — SODIUM CHLORIDE 0.9 % IV SOLN
510.0000 mg | Freq: Once | INTRAVENOUS | Status: AC
  Administered 2020-07-29: 510 mg via INTRAVENOUS
  Filled 2020-07-29: qty 510

## 2020-07-29 NOTE — Patient Instructions (Signed)
Ferumoxytol injection What is this medicine? FERUMOXYTOL is an iron complex. Iron is used to make healthy red blood cells, which carry oxygen and nutrients throughout the body. This medicine is used to treat iron deficiency anemia. This medicine may be used for other purposes; ask your health care provider or pharmacist if you have questions. COMMON BRAND NAME(S): Feraheme What should I tell my health care provider before I take this medicine? They need to know if you have any of these conditions:  anemia not caused by low iron levels  high levels of iron in the blood  magnetic resonance imaging (MRI) test scheduled  an unusual or allergic reaction to iron, other medicines, foods, dyes, or preservatives  pregnant or trying to get pregnant  breast-feeding How should I use this medicine? This medicine is for injection into a vein. It is given by a health care professional in a hospital or clinic setting. Talk to your pediatrician regarding the use of this medicine in children. Special care may be needed. Overdosage: If you think you have taken too much of this medicine contact a poison control center or emergency room at once. NOTE: This medicine is only for you. Do not share this medicine with others. What if I miss a dose? It is important not to miss your dose. Call your doctor or health care professional if you are unable to keep an appointment. What may interact with this medicine? This medicine may interact with the following medications:  other iron products This list may not describe all possible interactions. Give your health care provider a list of all the medicines, herbs, non-prescription drugs, or dietary supplements you use. Also tell them if you smoke, drink alcohol, or use illegal drugs. Some items may interact with your medicine. What should I watch for while using this medicine? Visit your doctor or healthcare professional regularly. Tell your doctor or healthcare  professional if your symptoms do not start to get better or if they get worse. You may need blood work done while you are taking this medicine. You may need to follow a special diet. Talk to your doctor. Foods that contain iron include: whole grains/cereals, dried fruits, beans, or peas, leafy green vegetables, and organ meats (liver, kidney). What side effects may I notice from receiving this medicine? Side effects that you should report to your doctor or health care professional as soon as possible:  allergic reactions like skin rash, itching or hives, swelling of the face, lips, or tongue  breathing problems  changes in blood pressure  feeling faint or lightheaded, falls  fever or chills  flushing, sweating, or hot feelings  swelling of the ankles or feet Side effects that usually do not require medical attention (report to your doctor or health care professional if they continue or are bothersome):  diarrhea  headache  nausea, vomiting  stomach pain This list may not describe all possible side effects. Call your doctor for medical advice about side effects. You may report side effects to FDA at 1-800-FDA-1088. Where should I keep my medicine? This drug is given in a hospital or clinic and will not be stored at home. NOTE: This sheet is a summary. It may not cover all possible information. If you have questions about this medicine, talk to your doctor, pharmacist, or health care provider.  2021 Elsevier/Gold Standard (2016-06-18 20:21:10)  

## 2020-08-01 ENCOUNTER — Other Ambulatory Visit: Payer: Self-pay

## 2020-08-01 ENCOUNTER — Inpatient Hospital Stay: Payer: Medicare Other

## 2020-08-01 VITALS — BP 172/76 | HR 55 | Temp 98.6°F | Resp 18

## 2020-08-01 DIAGNOSIS — D751 Secondary polycythemia: Secondary | ICD-10-CM

## 2020-08-01 DIAGNOSIS — Z853 Personal history of malignant neoplasm of breast: Secondary | ICD-10-CM | POA: Diagnosis not present

## 2020-08-01 DIAGNOSIS — D500 Iron deficiency anemia secondary to blood loss (chronic): Secondary | ICD-10-CM

## 2020-08-01 DIAGNOSIS — D509 Iron deficiency anemia, unspecified: Secondary | ICD-10-CM | POA: Diagnosis not present

## 2020-08-01 DIAGNOSIS — D45 Polycythemia vera: Secondary | ICD-10-CM | POA: Diagnosis not present

## 2020-08-01 MED ORDER — SODIUM CHLORIDE 0.9 % IV SOLN
INTRAVENOUS | Status: DC
  Filled 2020-08-01: qty 250

## 2020-08-01 MED ORDER — SODIUM CHLORIDE 0.9 % IV SOLN
510.0000 mg | Freq: Once | INTRAVENOUS | Status: AC
  Administered 2020-08-01: 510 mg via INTRAVENOUS
  Filled 2020-08-01: qty 510

## 2020-08-01 NOTE — Patient Instructions (Signed)

## 2020-08-02 ENCOUNTER — Inpatient Hospital Stay: Admission: RE | Admit: 2020-08-02 | Payer: Medicare Other | Source: Ambulatory Visit

## 2020-08-02 ENCOUNTER — Other Ambulatory Visit: Payer: Self-pay | Admitting: Internal Medicine

## 2020-08-02 DIAGNOSIS — C801 Malignant (primary) neoplasm, unspecified: Secondary | ICD-10-CM | POA: Insufficient documentation

## 2020-08-02 DIAGNOSIS — R0600 Dyspnea, unspecified: Secondary | ICD-10-CM | POA: Insufficient documentation

## 2020-08-02 DIAGNOSIS — H9203 Otalgia, bilateral: Secondary | ICD-10-CM | POA: Insufficient documentation

## 2020-08-02 DIAGNOSIS — K5792 Diverticulitis of intestine, part unspecified, without perforation or abscess without bleeding: Secondary | ICD-10-CM | POA: Insufficient documentation

## 2020-08-02 DIAGNOSIS — K219 Gastro-esophageal reflux disease without esophagitis: Secondary | ICD-10-CM | POA: Insufficient documentation

## 2020-08-02 DIAGNOSIS — I25110 Atherosclerotic heart disease of native coronary artery with unstable angina pectoris: Secondary | ICD-10-CM | POA: Insufficient documentation

## 2020-08-02 DIAGNOSIS — I82409 Acute embolism and thrombosis of unspecified deep veins of unspecified lower extremity: Secondary | ICD-10-CM | POA: Insufficient documentation

## 2020-08-02 DIAGNOSIS — I2510 Atherosclerotic heart disease of native coronary artery without angina pectoris: Secondary | ICD-10-CM | POA: Insufficient documentation

## 2020-08-02 DIAGNOSIS — Z1231 Encounter for screening mammogram for malignant neoplasm of breast: Secondary | ICD-10-CM

## 2020-08-02 DIAGNOSIS — I10 Essential (primary) hypertension: Secondary | ICD-10-CM | POA: Insufficient documentation

## 2020-08-09 NOTE — Progress Notes (Signed)
Cardiology Office Note:    Date:  08/10/2020   ID:  [name], DOB 17-Oct-1929, MRN [id number]  PCP:  Colon Branch, MD  Cardiologist:  Shirlee More, MD   Referring MD: Colon Branch, MD  ASSESSMENT:    1. Chest pain of uncertain etiology   2. Mild CAD   3. Chronic diastolic CHF (congestive heart failure) (HCC)    PLAN:    In order of problems listed above:  She has ischemia with nonobstructive coronary arteries and has rare abdominal discomfort.  At this time I would continue current treatment she is using nitroglycerin frequently but I think things like long-acting nitroglycerin are good choice at this time is not interested ischemia she will continue her current treatment including aspirin beta-blocker nitroglycerin. She has no fluid overload is taken diuretic as needed to continue her current treatment  Next appointment I will plan to see her in follow-up in 3 months  Medication Adjustments/Labs and Tests Ordered: Current medicines are reviewed at length with the patient today.  Concerns regarding medicines are outlined above.  No orders of the defined types were placed in this encounter.  No orders of the defined types were placed in this encounter.    Chief Complaint  Patient presents with  . Follow-up  . Chest Pain    She has a history of MI and ischemia with nonobstructive coronary arteries    History of Present Illness:    [name] is a 87 y.o. female who is being seen today for the evaluation of chest pain at the request of Colon Branch, MD.  When seen 07/26/2020 she had an episode of chest pain the day prior relieved with nitroglycerin.  I had seen her 12/01/2018 for shortness of breath associated with a low BNP level 196 and echocardiogram with normal left ventricular ejection fraction mild mitral regurgitation moderate left atrial enlargement.  There was concern of untreated obstructive sleep apnea and a home sleep study was ordered she was  also noted to have polycythemia.  Chest x-ray 03/04/2020 with no acute cardiopulmonary findings, she had new severe compression fracture T8 and multiple rib fractures old.  EKG 04/22/2020 showed sinus rhythm and was normal  There is a complicated medical history including previous Takotsubo cardiomyopathy chest pain with normal coronary angiography in 1990 and repeat cardiac cath in 2003 with mild nonobstructive CAD but EF diminished 40% with subsequent functional recovery EF 60% in January 2020 stroke and a  history of venous thromboembolism and Raynaud's phenomenon.  She tells me she has had an ED visit for chest pain.  Of multiple ED visits for falls back pain but no chest pain. Few weeks ago she had an episode of midsternal pain took nitroglycerin currently.  She has had no other episodes. Her predominant complaint is heartburn indigestion at times food sticks and she is unable to swallow. She is not having typical angina edema shortness of breath palpitation or syncope. She tells me she does not want to undergo testing like her heart catheterization again Past Medical History:  Diagnosis Date  . CAD (coronary artery disease)    mild nonobstructive disease on cath in 2003  . Cancer ([place])   . Cardiomyopathy    Probable Takotsubo, severe CP w/ normal cath in 1994. Severe CP in 2003 w/ widespread T wave inversions on ECG. Cath w/ minimal coronary disease but LV-gram showed periapical severe hypokinesis and basilar hyperkinesia (EF 40%). Last echo in 4/09 confirmed full LV  functional recovery with EF 60%, no regional wall motion abnormalities, mild to moderate LVH.  Marland Kitchen Chronic diastolic CHF (congestive heart failure) ([place]) 09/13/2019  . CVA (cerebral infarction)   . Depression with anxiety 03-29-11   lost husband 3'09  . Diverticulitis   . DVT (deep venous thrombosis) ([place])    after venous ablation, R leg  . Dyspnea   . E. coli gastroenteritis 03-29-11   8'10  . Fracture 09/10/07   L2,  status post vertebroplasty of L2 performed by IR  . GERD (gastroesophageal reflux disease)   . Headache(784.0)    migraines  . Hemorrhoids   . Hiatal hernia 03-29-11   no nerve problems  . Hyperlipidemia   . Hyperplastic colon polyp 06/2001  . Hypertension   . Iron deficiency anemia due to chronic blood loss 12/30/2017  . OA (osteoarthritis) of knee 03-29-11   w/ bilateral knee pain-not a problem now  . OSA (obstructive sleep apnea) 03-29-11   no cpap used, not a problem now.  . Osteoporosis   . Otalgia of both ears    Dr. Simeon Craft  . Polycythemia   . Stroke [facility]) 03-29-11   CVA x2 -last 10'12-?TIA(visual problems)  . Varicose vein     Past Surgical History:  Procedure Laterality Date  . APPENDECTOMY    . BACK SURGERY    . BREAST BIOPSY    . BREAST CYST EXCISION    . BREAST LUMPECTOMY Left 2013   left stage I left breast cancer  . CHOLECYSTECTOMY  04/09/2011   Procedure: LAPAROSCOPIC CHOLECYSTECTOMY WITH INTRAOPERATIVE CHOLANGIOGRAM;  Surgeon: Odis Hollingshead, MD;  Location: [facility];  Service: General;  Laterality: N/A;  . Dental Extraction     L maxillary molar  . INTRAMEDULLARY (IM) NAIL INTERTROCHANTERIC Left 12/25/2019   Procedure: INTRAMEDULLARY (IM) NAIL INTERTROCHANTRIC;  Surgeon: Paralee Cancel, MD;  Location: [facility];  Service: Orthopedics;  Laterality: Left;  . IR KYPHO THORACIC WITH BONE BIOPSY  09/15/2019  . IR KYPHO THORACIC WITH BONE BIOPSY  07/13/2020      . IR KYPHO THORACIC WITH BONE BIOPSY  07/13/2020  . IR VERTEBROPLASTY CERV/THOR BX INC UNI/BIL INC/INJECT/IMAGING  11/10/2019  . KYPHOSIS SURGERY  08/2007   cement used  . OVARIAN CYST SURGERY     left  . RADIOLOGY WITH ANESTHESIA N/A 11/10/2019   Procedure: VERTEBROPLASTY;  Surgeon: Luanne Bras, MD;  Location: [place];  Service: Radiology;  Laterality: N/A;  . TONSILLECTOMY    . TOTAL KNEE ARTHROPLASTY  06/2010   left  . TUBAL LIGATION      Current Medications: Current Meds  Medication Sig  . alendronate  (FOSAMAX) 70 MG tablet Take 1 tablet (70 mg total) by mouth every 7 (seven) days. Take with a full glass of water on an empty stomach.  Marland Kitchen aspirin 81 MG EC tablet Take 1 tablet (81 mg total) by mouth daily. Swallow whole.  . bisacodyl (DULCOLAX) 10 MG suppository Place 1 suppository (10 mg total) rectally daily as needed for moderate constipation.  . bismuth subsalicylate (PEPTO BISMOL) 262 MG/15ML suspension Take 30 mLs by mouth every 4 (four) hours as needed for indigestion (heartburn).   . cyclobenzaprine (FLEXERIL) 5 MG tablet Take 1 tablet (5 mg total) by mouth 3 (three) times daily as needed for muscle spasms.  Marland Kitchen docusate sodium (COLACE) 100 MG capsule Take 100 mg by mouth 2 (two) times daily as needed.  . furosemide (LASIX) 20 MG tablet Take 1 tablet (20 mg total) by  mouth daily. (Patient taking differently: Take 20 mg by mouth daily as needed.)  . HYDROcodone-acetaminophen (NORCO/VICODIN) 5-325 MG tablet Take 1 tablet by mouth 2 (two) times daily as needed for moderate pain (pain score 4-6).  . hydroxyurea (HYDREA) 500 MG capsule Take 1 capsule (500 mg total) by mouth every other day. May take with food to minimize GI side effects.  . Infant Care Products (DERMACLOUD) CREA Apply 1 application topically See admin instructions. Apply to buttocks topically every shift for rash  . lactose free nutrition (BOOST) LIQD Take 237 mLs by mouth See admin instructions. Every shift  . Lidocaine 4 % PTCH LIDOCAINE 4% PATCH :Apply 1 patch topically daily and remove after 12 hours to left lateral chest (Patient taking differently: Apply 1 patch topically daily. Apply to left lateral chest. Remove after 12 hours.)  . LORazepam (ATIVAN) 0.5 MG tablet TAKE 1/2 TABLET BY MOUTH EVERY MORNING AND EVERY AFTERNOON THEN TAKE 2 TABLETS BY MOUTH AT BEDTIME AS NEEDED  . metoprolol tartrate (LOPRESSOR) 50 MG tablet Take 1 tablet (50 mg total) by mouth 2 (two) times daily.  . Multiple Vitamin (MULTIVITAMIN WITH MINERALS)  TABS tablet Take 1 tablet by mouth daily.  . nitroGLYCERIN (NITROSTAT) 0.4 MG SL tablet Place 1 tablet (0.4 mg total) under the tongue every 5 (five) minutes x 3 doses as needed for chest pain.  Marland Kitchen omeprazole (PRILOSEC) 20 MG capsule Take 1 capsule (20 mg total) by mouth daily.  . ondansetron (ZOFRAN) 4 MG tablet Take 1 tablet (4 mg total) by mouth every 6 (six) hours as needed for nausea or vomiting.  . polyethylene glycol (MIRALAX / GLYCOLAX) 17 g packet Take 17 g by mouth daily.  Marland Kitchen tiZANidine (ZANAFLEX) 2 MG tablet Take 2 mg by mouth every 6 (six) hours as needed for muscle spasms.  . valACYclovir (VALTREX) 500 MG tablet TAKE 2 TABLETS BY MOUTH AT ONSET OF RASH AND 2 TABLETS 12 HOURS LATER AS NEEDED     Allergies:   Penicillins, Valsartan, Atorvastatin, Carvedilol, Ezetimibe, Fluvastatin sodium, Magnesium hydroxide, Meloxicam, Pneumovax [pneumococcal polysaccharide vaccine], Quinapril hcl, Simvastatin, Topamax [topiramate], and Vit d-vit e-safflower oil   Social History   Socioeconomic History  . Marital status: Widowed    Spouse name: Not on file  . Number of children: 3  . Years of education: Not on file  . Highest education level: Not on file  Occupational History  . Occupation: n/a  Tobacco Use  . Smoking status: Former Smoker    Packs/day: 1.00    Years: 37.00    Pack years: 37.00    Types: Cigarettes    Start date: 06/26/1951    Quit date: 05/14/1988    Years since quitting: 32.2  . Smokeless tobacco: Never Used  . Tobacco comment: quit 25 years ago  Vaping Use  . Vaping Use: Never used  Substance and Sexual Activity  . Alcohol use: Yes    Alcohol/week: 1.0 standard drink    Types: 1 Glasses of wine per week    Comment: occasional/social  . Drug use: No  . Sexual activity: Not Currently  Other Topics Concern  . Not on file  Social History Narrative   Lost husband 07/29/07-    Lives in [place] in a town house by herself, has 3 daughters, lost Mickel Baas [phone])    [place] ( [place]     Not driving as of [id number]  Social Determinants of Health   Financial Resource Strain: Not on file  Food Insecurity: Not on file  Transportation Needs: Not on file  Physical Activity: Not on file  Stress: Not on file  Social Connections: Not on file     Family History: The patient's family history includes Breast cancer in her mother and sister; Colon cancer in an other family member; Heart disease in her father; Leukemia in her brother.  ROS:   ROS Please see the history of present illness.     All other systems reviewed and are negative.  EKGs/Labs/Other Studies Reviewed:    The following studies were reviewed today:   EKG:  EKG is  ordered today.  The ekg ordered today is personally reviewed and demonstrates sinus rhythm old anterior septal infarct, and is similar in appearance to 03/04/2020  Recent Labs: 11/11/2019: Magnesium 2.2 04/22/2020: TSH 2.61 07/19/2020: ALT 9; Hemoglobin 14.7; Platelet Count 344 07/25/2020: BUN 9; Creatinine, Ser 0.79; Potassium 4.5; Sodium 136  Recent Lipid Panel    Component Value Date/Time   CHOL 212 (H) 03/19/2014 1141   TRIG 110.0 03/19/2014 1141   HDL 47.20 03/19/2014 1141   CHOLHDL 4 03/19/2014 1141   VLDL 22.0 03/19/2014 1141   LDLCALC 143 (H) 03/19/2014 1141   LDLDIRECT 142.3 06/25/2011 0954    Physical Exam:    VS:  BP (!) 144/88   Pulse (!) 59   Ht 5\' 6"  (1.676 m)   Wt 117 lb (53.1 kg)   SpO2 91%   BMI 18.88 kg/m     Wt Readings from Last 3 Encounters:  08/10/20 117 lb (53.1 kg)  07/25/20 119 lb 4 oz (54.1 kg)  07/19/20 121 lb 6.4 oz (55.1 kg)     GEN:  Well nourished, well developed in no acute distress HEENT: Normal NECK: No JVD; No carotid bruits LYMPHATICS: No lymphadenopathy CARDIAC: RRR, no murmurs, rubs, gallops RESPIRATORY:  Clear to auscultation without rales, wheezing or rhonchi  ABDOMEN: Soft, non-tender, non-distended MUSCULOSKELETAL:  No edema;  No deformity  SKIN: Warm and dry NEUROLOGIC:  Alert and oriented x 3 PSYCHIATRIC:  Normal affect     Signed, Shirlee More, MD  08/10/2020 2:18 PM    [facility]

## 2020-08-10 ENCOUNTER — Other Ambulatory Visit: Payer: Self-pay

## 2020-08-10 ENCOUNTER — Ambulatory Visit (INDEPENDENT_AMBULATORY_CARE_PROVIDER_SITE_OTHER): Payer: Medicare Other | Admitting: Cardiology

## 2020-08-10 VITALS — BP 144/88 | HR 59 | Ht 66.0 in | Wt 117.0 lb

## 2020-08-10 DIAGNOSIS — R079 Chest pain, unspecified: Secondary | ICD-10-CM | POA: Diagnosis not present

## 2020-08-10 DIAGNOSIS — I5032 Chronic diastolic (congestive) heart failure: Secondary | ICD-10-CM | POA: Diagnosis not present

## 2020-08-10 DIAGNOSIS — I2510 Atherosclerotic heart disease of native coronary artery without angina pectoris: Secondary | ICD-10-CM | POA: Diagnosis not present

## 2020-08-10 NOTE — Patient Instructions (Signed)

## 2020-08-14 ENCOUNTER — Telehealth: Payer: Self-pay | Admitting: Internal Medicine

## 2020-08-15 NOTE — Telephone Encounter (Signed)
PDMP okay, prescription sent 

## 2020-08-15 NOTE — Telephone Encounter (Signed)
Requesting: lorazepam 0.5mg  Contract: 04/22/2020 UDS: 06/29/2019 Last Visit: 07/25/20 Next Visit: 08/22/20  Last Refill: 05/17/2020 #90 and 1RF  Please Advise

## 2020-08-22 ENCOUNTER — Ambulatory Visit (INDEPENDENT_AMBULATORY_CARE_PROVIDER_SITE_OTHER): Payer: Medicare Other | Admitting: Internal Medicine

## 2020-08-22 ENCOUNTER — Other Ambulatory Visit: Payer: Self-pay

## 2020-08-22 VITALS — BP 128/79 | HR 62 | Temp 97.5°F | Ht 66.0 in | Wt 115.6 lb

## 2020-08-22 DIAGNOSIS — I10 Essential (primary) hypertension: Secondary | ICD-10-CM | POA: Diagnosis not present

## 2020-08-22 DIAGNOSIS — G4700 Insomnia, unspecified: Secondary | ICD-10-CM

## 2020-08-22 DIAGNOSIS — I2510 Atherosclerotic heart disease of native coronary artery without angina pectoris: Secondary | ICD-10-CM

## 2020-08-22 DIAGNOSIS — R21 Rash and other nonspecific skin eruption: Secondary | ICD-10-CM | POA: Diagnosis not present

## 2020-08-22 DIAGNOSIS — F341 Dysthymic disorder: Secondary | ICD-10-CM | POA: Diagnosis not present

## 2020-08-22 NOTE — Progress Notes (Signed)
Subjective:    Patient ID: [name], female    DOB: 1929-08-25, 87 y.o.   MRN: [id number]  DOS:  08/22/2020 Type of visit - description: Follow-up  Since the last office visit he is doing okay. Back pain has diminished.  Still having issues with the left hip (since surgery 12-2019) Today she reports a rash going on for 2 months, pruritic, was never painful. Located primarily at the left anterior chest but also at the back, R eyebrows.  She denies any fever or chills.   Past Medical History:  Diagnosis Date  . CAD (coronary artery disease)    mild nonobstructive disease on cath in 2003  . Cancer ([place])   . Cardiomyopathy    Probable Takotsubo, severe CP w/ normal cath in 1994. Severe CP in 2003 w/ widespread T wave inversions on ECG. Cath w/ minimal coronary disease but LV-gram showed periapical severe hypokinesis and basilar hyperkinesia (EF 40%). Last echo in 4/09 confirmed full LV functional recovery with EF 60%, no regional wall motion abnormalities, mild to moderate LVH.  Marland Kitchen Chronic diastolic CHF (congestive heart failure) ([place]) 09/13/2019  . CVA (cerebral infarction)   . Depression with anxiety 03-29-11   lost husband 3'09  . Diverticulitis   . DVT (deep venous thrombosis) ([place])    after venous ablation, R leg  . Dyspnea   . E. coli gastroenteritis 03-29-11   8'10  . Fracture 09/10/07   L2, status post vertebroplasty of L2 performed by IR  . GERD (gastroesophageal reflux disease)   . Headache(784.0)    migraines  . Hemorrhoids   . Hiatal hernia 03-29-11   no nerve problems  . Hyperlipidemia   . Hyperplastic colon polyp 06/2001  . Hypertension   . Iron deficiency anemia due to chronic blood loss 12/30/2017  . OA (osteoarthritis) of knee 03-29-11   w/ bilateral knee pain-not a problem now  . OSA (obstructive sleep apnea) 03-29-11   no cpap used, not a problem now.  . Osteoporosis   . Otalgia of both ears    Dr. Simeon Craft  . Polycythemia   . Stroke [facility])  03-29-11   CVA x2 -last 10'12-?TIA(visual problems)  . Varicose vein     Past Surgical History:  Procedure Laterality Date  . APPENDECTOMY    . BACK SURGERY    . BREAST BIOPSY    . BREAST CYST EXCISION    . BREAST LUMPECTOMY Left 2013   left stage I left breast cancer  . CHOLECYSTECTOMY  04/09/2011   Procedure: LAPAROSCOPIC CHOLECYSTECTOMY WITH INTRAOPERATIVE CHOLANGIOGRAM;  Surgeon: Odis Hollingshead, MD;  Location: [facility];  Service: General;  Laterality: N/A;  . Dental Extraction     L maxillary molar  . INTRAMEDULLARY (IM) NAIL INTERTROCHANTERIC Left 12/25/2019   Procedure: INTRAMEDULLARY (IM) NAIL INTERTROCHANTRIC;  Surgeon: Paralee Cancel, MD;  Location: [facility];  Service: Orthopedics;  Laterality: Left;  . IR KYPHO THORACIC WITH BONE BIOPSY  09/15/2019  . IR KYPHO THORACIC WITH BONE BIOPSY  07/13/2020      . IR KYPHO THORACIC WITH BONE BIOPSY  07/13/2020  . IR VERTEBROPLASTY CERV/THOR BX INC UNI/BIL INC/INJECT/IMAGING  11/10/2019  . KYPHOSIS SURGERY  08/2007   cement used  . OVARIAN CYST SURGERY     left  . RADIOLOGY WITH ANESTHESIA N/A 11/10/2019   Procedure: VERTEBROPLASTY;  Surgeon: Luanne Bras, MD;  Location: [facility];  Service: Radiology;  Laterality: N/A;  . TONSILLECTOMY    . TOTAL KNEE ARTHROPLASTY  06/2010   left  . TUBAL LIGATION      Allergies as of 08/22/2020      Reactions   Penicillins Hives, Rash, Other (See Comments)   Has patient had a PCN reaction causing immediate rash, facial/tongue/throat swelling, SOB or lightheadedness with hypotension: Yes Has patient had a PCN reaction causing severe rash involving mucus membranes or skin necrosis: yes Has patient had a PCN reaction that required hospitalization: No Has patient had a PCN reaction occurring within the last 10 years: no If all of the above answers are "NO", then may proceed with Cephalosporin use. Other Reaction: OTHER REACTION   Valsartan Itching, Other (See Comments)   Atorvastatin Diarrhea    Carvedilol Other (See Comments)   "teeth hurt when I took it"   Ezetimibe Nausea Only   Fluvastatin Sodium Other (See Comments)   "Can't remember"   Magnesium Hydroxide Other (See Comments)   REACTION: triggers HAs   Meloxicam Other (See Comments)   Pt seeing auro's / spots.     Pneumovax [pneumococcal Polysaccharide Vaccine] Other (See Comments)   Local reaction   Quinapril Hcl Other (See Comments)    "feelings of tiredness"   Simvastatin Diarrhea   Topamax [topiramate] Itching   Vit D-vit E-safflower Oil Other (See Comments)   Headaches      Medication List       Accurate as of August 22, 2020 11:59 PM. If you have any questions, ask your nurse or doctor.        STOP taking these medications   HYDROcodone-acetaminophen 5-325 MG tablet Commonly known as: NORCO/VICODIN Stopped by: Kathlene November, MD     TAKE these medications   alendronate 70 MG tablet Commonly known as: FOSAMAX Take 1 tablet (70 mg total) by mouth every 7 (seven) days. Take with a full glass of water on an empty stomach.   aspirin 81 MG EC tablet Take 1 tablet (81 mg total) by mouth daily. Swallow whole.   bisacodyl 10 MG suppository Commonly known as: DULCOLAX Place 1 suppository (10 mg total) rectally daily as needed for moderate constipation.   bismuth subsalicylate 188 CZ/66AY suspension Commonly known as: PEPTO BISMOL Take 30 mLs by mouth every 4 (four) hours as needed for indigestion (heartburn).   cyclobenzaprine 5 MG tablet Commonly known as: FLEXERIL Take 1 tablet (5 mg total) by mouth 3 (three) times daily as needed for muscle spasms.   Dermacloud Crea Apply 1 application topically See admin instructions. Apply to buttocks topically every shift for rash   docusate sodium 100 MG capsule Commonly known as: COLACE Take 100 mg by mouth 2 (two) times daily as needed.   furosemide 20 MG tablet Commonly known as: Lasix Take 1 tablet (20 mg total) by mouth daily. What changed:   when to  take this  reasons to take this   hydroxyurea 500 MG capsule Commonly known as: HYDREA Take 1 capsule (500 mg total) by mouth every other day. May take with food to minimize GI side effects.   lactose free nutrition Liqd Take 237 mLs by mouth See admin instructions. Every shift   Lidocaine 4 % Ptch LIDOCAINE 4% PATCH :Apply 1 patch topically daily and remove after 12 hours to left lateral chest What changed:   how much to take  how to take this  when to take this  additional instructions   LORazepam 0.5 MG tablet Commonly known as: ATIVAN TAKE 1/2 TABLET BY MOUTH EVERY MORNING AND EVERY AFTERNOON THEN TAKE  2 TABLETS BY MOUTH AT BEDTIME AS NEEDED   metoprolol tartrate 50 MG tablet Commonly known as: LOPRESSOR Take 1 tablet (50 mg total) by mouth 2 (two) times daily.   multivitamin with minerals Tabs tablet Take 1 tablet by mouth daily.   nitroGLYCERIN 0.4 MG SL tablet Commonly known as: NITROSTAT Place 1 tablet (0.4 mg total) under the tongue every 5 (five) minutes x 3 doses as needed for chest pain.   omeprazole 20 MG capsule Commonly known as: PRILOSEC Take 1 capsule (20 mg total) by mouth daily.   ondansetron 4 MG tablet Commonly known as: ZOFRAN Take 1 tablet (4 mg total) by mouth every 6 (six) hours as needed for nausea or vomiting.   polyethylene glycol 17 g packet Commonly known as: MIRALAX / GLYCOLAX Take 17 g by mouth daily.   tiZANidine 2 MG tablet Commonly known as: ZANAFLEX Take 2 mg by mouth every 6 (six) hours as needed for muscle spasms.   valACYclovir 500 MG tablet Commonly known as: VALTREX TAKE 2 TABLETS BY MOUTH AT ONSET OF RASH AND 2 TABLETS 12 HOURS LATER AS NEEDED          Objective:   Physical Exam BP 128/79 (BP Location: Right Arm, Patient Position: Sitting, Cuff Size: Large)   Pulse 62   Temp (!) 97.5 F (36.4 C) (Temporal)   Ht 5\' 6"  (1.676 m)   Wt 115 lb 9.6 oz (52.4 kg)   SpO2 96%   BMI 18.66 kg/m  General:   Well  developed, NAD, BMI noted. HEENT:  Normocephalic . Face symmetric, atraumatic Lungs:  CTA B Normal respiratory effort, no intercostal retractions, no accessory muscle use. Heart: RRR,  no murmur.  Lower extremities: no pretibial edema bilaterally  Neurologic:  alert & oriented X3.  Speech normal, gait: Assisted by a cane, walks with a limp Skin: Has a rash at the left chest, see picture.  Similar but much smaller areas noted at the right side of the chest, left upper back, R eyebrow. Interestingly, she has some redness around the fingernails. Psych--  Cognition and judgment appear intact.  Cooperative with normal attention span and concentration.  Behavior appropriate. No anxious or depressed appearing.          Assessment     Assessment  HTN Dyslipidemia: Intolerant to most medicines including Zetia, Pravachol, Niaspan Depression, anxiety , insomnia - on ativan bid (03-2015 citalopram cause drowsiness, 04-2015 prozac:diarrhea) Hem-Onc: POLYCYTEMIA: sees hem-onc H/o breast ca  MSK: ---DJD :  Tylenol #3, uses rarely ---Osteoporosis:  T score -2.8 (02-2013): declined treatment 06-2017 ---L2 fractures, s/p vertebral plasty 2009, s/p vertebral plasty T 8 07/2020 Migraine headaches - Tylenol #3 (rarely)  GI: GERD- intolerant to prevacid, visual disturbances HH, diverticulitis, Colon polyps  h/o "functional " LLQ abd pain CV: ---Cardiomyopathy Probable Takotsubo cardiomyopathy: CP w/ normal cath in 1994.   Severe CP  2003 with widespread T wave inversions on ECG.  Cath with minimal coronary disease but LV-gram showed periapical severe hypokinesis and basilar hyperkinesia (EF 40%) >>> ECHO 4/09 confirmed full LV functional recovery with EF 60%, mild LVH. 2012: Nl EF per echo, (-) Lexiscan 05/21/18 ECHO Nl/stable ---Stroke / TIA 2012 ---ICB seizure 10/2019 was rx keppra, self d/c 05/2019  Sleep apnea 2012, , not on CPAP H/oDVT, right leg after venous ablation Raynaud phenomena,  DX 05/2018  PLAN HTN: Seems well controlled Anxiety, depression, insomnia:   PHQ-9: Score 11, moderate, I asked her if she feels sad and  she said sometimes.  No suicidal ideas. "I am tired of feel tired and sick".  Listening therapy provided, counseled.  She has multiple medical problems. Rash: Unclear etiology, refer to dermatology. CV: Saw cardiology 08/10/2020, was noted to have chest pain of uncertain etiology, they agreed on no change in therapy Osteoporosis: On Fosamax, reports good compliance. MSK: has a limp since she had surgery for a left hip fracture 12-2019, plans to contact Ortho.  Might benefit from a shoe insert, she thinks there is a leg length discrepancy. RTC 3 months  Time spent with the patient 28 minutes, provided listening therapy for depression and anxiety  This visit occurred during the SARS-CoV-2 public health emergency.  Safety protocols were in place, including screening questions prior to the visit, additional usage of staff PPE, and extensive cleaning of exam room while observing appropriate contact time as indicated for disinfecting solutions.

## 2020-08-22 NOTE — Patient Instructions (Signed)
We are referring you to the dermatologist    GO TO THE FRONT DESK, [place] back for   a checkup in 3 months

## 2020-08-23 ENCOUNTER — Other Ambulatory Visit: Payer: Self-pay | Admitting: Family

## 2020-08-23 DIAGNOSIS — D751 Secondary polycythemia: Secondary | ICD-10-CM

## 2020-08-23 NOTE — Assessment & Plan Note (Signed)
HTN: Seems well controlled Anxiety, depression, insomnia:   PHQ-9: Score 11, moderate, I asked her if she feels sad and she said sometimes.  No suicidal ideas. "I am tired of feel tired and sick".  Listening therapy provided, counseled.  She has multiple medical problems. Rash: Unclear etiology, refer to dermatology. CV: Saw cardiology 08/10/2020, was noted to have chest pain of uncertain etiology, they agreed on no change in therapy Osteoporosis: On Fosamax, reports good compliance. MSK: has a limp since she had surgery for a left hip fracture 12-2019, plans to contact Ortho.  Might benefit from a shoe insert, she thinks there is a leg length discrepancy. RTC 3 months

## 2020-09-05 ENCOUNTER — Telehealth: Payer: Self-pay

## 2020-09-05 ENCOUNTER — Telehealth: Payer: Self-pay | Admitting: *Deleted

## 2020-09-05 MED ORDER — METHYLPREDNISOLONE 4 MG PO TBPK: ORAL_TABLET | ORAL | 0 refills | Status: DC

## 2020-09-05 NOTE — Telephone Encounter (Signed)
Call received from patient stating that she has an all over red, itching rash which she is not sure how long she has had.  She believes that it has been several weeks and started shortly after her iron infusions.  Dr. Marin Olp notified.  Orders received for pt to come in this week to be seen and for a Medrol dose pack to be sent in for her.  Pt notified and message sent to scheduling.

## 2020-09-05 NOTE — Telephone Encounter (Signed)
Pt called and left a message that she has a rash that she thinks may be from the iron she recd here in march.  States she saw her general doctor and they did not do anything for her.  Pt req a visit with [place] and or a call.  Thank you Webb Silversmith

## 2020-09-06 ENCOUNTER — Telehealth: Payer: Self-pay

## 2020-09-06 NOTE — Telephone Encounter (Signed)
Called pt per sch message and she is aware of her appt 4/27, also 4/27 is ok per Silas [place]

## 2020-09-07 ENCOUNTER — Inpatient Hospital Stay: Payer: Medicare Other

## 2020-09-07 ENCOUNTER — Other Ambulatory Visit: Payer: Self-pay

## 2020-09-07 ENCOUNTER — Inpatient Hospital Stay (HOSPITAL_BASED_OUTPATIENT_CLINIC_OR_DEPARTMENT_OTHER): Payer: Medicare Other | Admitting: Family

## 2020-09-07 ENCOUNTER — Inpatient Hospital Stay: Payer: Medicare Other | Attending: Hematology & Oncology

## 2020-09-07 VITALS — BP 159/77 | HR 60 | Resp 17 | Ht 66.0 in | Wt 116.1 lb

## 2020-09-07 VITALS — BP 152/84 | HR 66 | Resp 15

## 2020-09-07 DIAGNOSIS — R21 Rash and other nonspecific skin eruption: Secondary | ICD-10-CM

## 2020-09-07 DIAGNOSIS — D500 Iron deficiency anemia secondary to blood loss (chronic): Secondary | ICD-10-CM

## 2020-09-07 DIAGNOSIS — Z853 Personal history of malignant neoplasm of breast: Secondary | ICD-10-CM | POA: Insufficient documentation

## 2020-09-07 DIAGNOSIS — D751 Secondary polycythemia: Secondary | ICD-10-CM

## 2020-09-07 DIAGNOSIS — D45 Polycythemia vera: Secondary | ICD-10-CM | POA: Insufficient documentation

## 2020-09-07 DIAGNOSIS — I2510 Atherosclerotic heart disease of native coronary artery without angina pectoris: Secondary | ICD-10-CM | POA: Diagnosis not present

## 2020-09-07 DIAGNOSIS — D509 Iron deficiency anemia, unspecified: Secondary | ICD-10-CM | POA: Insufficient documentation

## 2020-09-07 LAB — CBC WITH DIFFERENTIAL (CANCER CENTER ONLY)
Abs Immature Granulocytes: 0.02 10*3/uL (ref 0.00–0.07)
Basophils Absolute: 0 10*3/uL (ref 0.0–0.1)
Basophils Relative: 1 %
Eosinophils Absolute: 0.1 10*3/uL (ref 0.0–0.5)
Eosinophils Relative: 1 %
HCT: 49.2 % — ABNORMAL HIGH (ref 36.0–46.0)
Hemoglobin: 16.1 g/dL — ABNORMAL HIGH (ref 12.0–15.0)
Immature Granulocytes: 0 %
Lymphocytes Relative: 9 %
Lymphs Abs: 0.5 10*3/uL — ABNORMAL LOW (ref 0.7–4.0)
MCH: 33.3 pg (ref 26.0–34.0)
MCHC: 32.7 g/dL (ref 30.0–36.0)
MCV: 101.7 fL — ABNORMAL HIGH (ref 80.0–100.0)
Monocytes Absolute: 0.4 10*3/uL (ref 0.1–1.0)
Monocytes Relative: 8 %
Neutro Abs: 4.5 10*3/uL (ref 1.7–7.7)
Neutrophils Relative %: 81 %
Platelet Count: 384 10*3/uL (ref 150–400)
RBC: 4.84 MIL/uL (ref 3.87–5.11)
RDW: 15.7 % — ABNORMAL HIGH (ref 11.5–15.5)
WBC Count: 5.5 10*3/uL (ref 4.0–10.5)
nRBC: 0 % (ref 0.0–0.2)

## 2020-09-07 LAB — CMP (CANCER CENTER ONLY)
ALT: 35 U/L (ref 0–44)
AST: 27 U/L (ref 15–41)
Albumin: 4.4 g/dL (ref 3.5–5.0)
Alkaline Phosphatase: 75 U/L (ref 38–126)
Anion gap: 6 (ref 5–15)
BUN: 12 mg/dL (ref 8–23)
CO2: 31 mmol/L (ref 22–32)
Calcium: 10.1 mg/dL (ref 8.9–10.3)
Chloride: 99 mmol/L (ref 98–111)
Creatinine: 0.79 mg/dL (ref 0.44–1.00)
GFR, Estimated: >60 mL/min (ref >60)
Glucose, Bld: 113 mg/dL — ABNORMAL HIGH (ref 70–99)
Potassium: 4.3 mmol/L (ref 3.5–5.1)
Sodium: 136 mmol/L (ref 135–145)
Total Bilirubin: 1.8 mg/dL — ABNORMAL HIGH (ref 0.3–1.2)
Total Protein: 7.2 g/dL (ref 6.5–8.1)

## 2020-09-07 LAB — LACTATE DEHYDROGENASE: LDH: 154 U/L (ref 98–192)

## 2020-09-07 MED ORDER — FAMOTIDINE IN NACL 20-0.9 MG/50ML-% IV SOLN
INTRAVENOUS | Status: AC
  Filled 2020-09-07: qty 100

## 2020-09-07 MED ORDER — DIPHENHYDRAMINE HCL 25 MG PO CAPS
ORAL_CAPSULE | ORAL | Status: AC
  Filled 2020-09-07: qty 1

## 2020-09-07 MED ORDER — FAMOTIDINE 200 MG/20ML IV SOLN
40.0000 mg | Freq: Once | INTRAVENOUS | Status: AC
  Administered 2020-09-07: 40 mg via INTRAVENOUS
  Filled 2020-09-07: qty 4

## 2020-09-07 MED ORDER — DIPHENHYDRAMINE HCL 25 MG PO CAPS
25.0000 mg | ORAL_CAPSULE | Freq: Once | ORAL | Status: AC
Start: 2020-09-07 — End: 2020-09-07
  Administered 2020-09-07: 25 mg via ORAL

## 2020-09-07 MED ORDER — SODIUM CHLORIDE 0.9 % IV SOLN
Freq: Once | INTRAVENOUS | Status: AC
  Filled 2020-09-07: qty 250

## 2020-09-07 MED ORDER — METHYLPREDNISOLONE SODIUM SUCC 125 MG IJ SOLR
INTRAMUSCULAR | Status: AC
  Filled 2020-09-07: qty 2

## 2020-09-07 MED ORDER — METHYLPREDNISOLONE SODIUM SUCC 125 MG IJ SOLR
60.0000 mg | Freq: Once | INTRAMUSCULAR | Status: AC
  Administered 2020-09-07: 60 mg via INTRAVENOUS

## 2020-09-07 NOTE — Patient Instructions (Signed)

## 2020-09-07 NOTE — Progress Notes (Signed)
Hematology and Oncology Follow Up Visit  [name] [id number] March 27, 1930 87 y.o. 09/07/2020   Principle Diagnosis:  Polycythemia vera - JAK2 positive Stage I (T1bN0M0 ) infiltrating duct carcinoma the left breast  Iron deficiency anemia -- symptomatic  Past Therapy: Femara 2.5 mg by mouth daily - stopped 08/2015  Current Therapy: Phlebotomy to maintain hematocrit below 45% Hydrea 500 mg by mouth every other day-- re-startedon 07/16/2019 IV Iron as needed   Interim History:  [name] is here today for follow-up. She still has the purple/red raised rash across her left chest, right back, right hand, forehead and lip. She picked up her medrol dose pack and started yesterday. She has itching at these sites.  She states that hydrocortisone cream and alcohol applied to these sites has not helped.  She denies any changes in medication or house hold cleaning products/soaps.  No fever, chills, n/v, cough, chest pain, palpitations, abdominal pain or changes in bowel ir bladder habits.  She has occasional SOB and dizziness with over exertion and takes a break to rest when needed.  She has not noted any blood loss. No bruising or petechiae.  Hct is 49.2%, platelets 384 and WBC count 5.5.  No swelling, numbness or tingling in her extremities at this time.  No new falls to report. No syncope.  She states that her appetite comes and goes. She is hydrating. Her weight is stable at 116 lbs.   ECOG Performance Status: 2 - Symptomatic, <50% confined to bed  Medications:  Allergies as of 09/07/2020      Reactions   Penicillins Hives, Rash, Other (See Comments)   Has patient had a PCN reaction causing immediate rash, facial/tongue/throat swelling, SOB or lightheadedness with hypotension: Yes Has patient had a PCN reaction causing severe rash involving mucus membranes or skin necrosis: yes Has patient had a PCN reaction that required hospitalization: No Has patient had a  PCN reaction occurring within the last 10 years: no If all of the above answers are "NO", then may proceed with Cephalosporin use. Other Reaction: OTHER REACTION   Valsartan Itching, Other (See Comments)   Atorvastatin Diarrhea   Carvedilol Other (See Comments)   "teeth hurt when I took it"   Ezetimibe Nausea Only   Fluvastatin Sodium Other (See Comments)   "Can't remember"   Magnesium Hydroxide Other (See Comments)   REACTION: triggers HAs   Meloxicam Other (See Comments)   Pt seeing auro's / spots.     Pneumovax [pneumococcal Polysaccharide Vaccine] Other (See Comments)   Local reaction   Quinapril Hcl Other (See Comments)    "feelings of tiredness"   Simvastatin Diarrhea   Topamax [topiramate] Itching   Vit D-vit E-safflower Oil Other (See Comments)   Headaches      Medication List       Accurate as of September 07, 2020 11:19 AM. If you have any questions, ask your nurse or doctor.        alendronate 70 MG tablet Commonly known as: FOSAMAX Take 1 tablet (70 mg total) by mouth every 7 (seven) days. Take with a full glass of water on an empty stomach.   aspirin 81 MG EC tablet Take 1 tablet (81 mg total) by mouth daily. Swallow whole.   bisacodyl 10 MG suppository Commonly known as: DULCOLAX Place 1 suppository (10 mg total) rectally daily as needed for moderate constipation.   bismuth subsalicylate 921 JH/41DE suspension Commonly known as: PEPTO BISMOL Take 30 mLs by mouth every 4 (  four) hours as needed for indigestion (heartburn).   cyclobenzaprine 5 MG tablet Commonly known as: FLEXERIL Take 1 tablet (5 mg total) by mouth 3 (three) times daily as needed for muscle spasms.   Dermacloud Crea Apply 1 application topically See admin instructions. Apply to buttocks topically every shift for rash   docusate sodium 100 MG capsule Commonly known as: COLACE Take 100 mg by mouth 2 (two) times daily as needed.   furosemide 20 MG tablet Commonly known as: Lasix Take 1  tablet (20 mg total) by mouth daily. What changed:   when to take this  reasons to take this   hydroxyurea 500 MG capsule Commonly known as: HYDREA TAKE 1 CAPSULE(500 MG) BY MOUTH EVERY OTHER DAY. MAY TAKE WITH FOOD TO MINIMIZE GI SIDE EFFECTS   lactose free nutrition Liqd Take 237 mLs by mouth See admin instructions. Every shift   Lidocaine 4 % Ptch LIDOCAINE 4% PATCH :Apply 1 patch topically daily and remove after 12 hours to left lateral chest What changed:   how much to take  how to take this  when to take this  additional instructions   LORazepam 0.5 MG tablet Commonly known as: ATIVAN TAKE 1/2 TABLET BY MOUTH EVERY MORNING AND EVERY AFTERNOON THEN TAKE 2 TABLETS BY MOUTH AT BEDTIME AS NEEDED   methylPREDNISolone 4 MG Tbpk tablet Commonly known as: MEDROL DOSEPAK Take as ordered per pharmacy   metoprolol tartrate 50 MG tablet Commonly known as: LOPRESSOR Take 1 tablet (50 mg total) by mouth 2 (two) times daily.   multivitamin with minerals Tabs tablet Take 1 tablet by mouth daily.   nitroGLYCERIN 0.4 MG SL tablet Commonly known as: NITROSTAT Place 1 tablet (0.4 mg total) under the tongue every 5 (five) minutes x 3 doses as needed for chest pain.   omeprazole 20 MG capsule Commonly known as: PRILOSEC Take 1 capsule (20 mg total) by mouth daily.   ondansetron 4 MG tablet Commonly known as: ZOFRAN Take 1 tablet (4 mg total) by mouth every 6 (six) hours as needed for nausea or vomiting.   polyethylene glycol 17 g packet Commonly known as: MIRALAX / GLYCOLAX Take 17 g by mouth daily.   tiZANidine 2 MG tablet Commonly known as: ZANAFLEX Take 2 mg by mouth every 6 (six) hours as needed for muscle spasms.   valACYclovir 500 MG tablet Commonly known as: VALTREX TAKE 2 TABLETS BY MOUTH AT ONSET OF RASH AND 2 TABLETS 12 HOURS LATER AS NEEDED       Allergies:  Allergies  Allergen Reactions  . Penicillins Hives, Rash and Other (See Comments)    Has  patient had a PCN reaction causing immediate rash, facial/tongue/throat swelling, SOB or lightheadedness with hypotension: Yes Has patient had a PCN reaction causing severe rash involving mucus membranes or skin necrosis: yes Has patient had a PCN reaction that required hospitalization: No Has patient had a PCN reaction occurring within the last 10 years: no If all of the above answers are "NO", then may proceed with Cephalosporin use.  Other Reaction: OTHER REACTION  . Valsartan Itching and Other (See Comments)  . Atorvastatin Diarrhea  . Carvedilol Other (See Comments)    "teeth hurt when I took it"   . Ezetimibe Nausea Only  . Fluvastatin Sodium Other (See Comments)    "Can't remember"  . Magnesium Hydroxide Other (See Comments)    REACTION: triggers HAs  . Meloxicam Other (See Comments)    Pt seeing auro's / spots.    Marland Kitchen  Pneumovax [Pneumococcal Polysaccharide Vaccine] Other (See Comments)    Local reaction  . Quinapril Hcl Other (See Comments)     "feelings of tiredness"  . Simvastatin Diarrhea  . Topamax [Topiramate] Itching  . Vit D-Vit E-Safflower Oil Other (See Comments)    Headaches    Past Medical History, Surgical history, Social history, and Family History were reviewed and updated.  Review of Systems: All other 10 point review of systems is negative.   Physical Exam:  vitals were not taken for this visit.   Wt Readings from Last 3 Encounters:  08/22/20 115 lb 9.6 oz (52.4 kg)  08/10/20 117 lb (53.1 kg)  07/25/20 119 lb 4 oz (54.1 kg)    Ocular: Sclerae unicteric, pupils equal, round and reactive to light Ear-nose-throat: Oropharynx clear, dentition fair Lymphatic: No cervical or supraclavicular adenopathy Lungs no rales or rhonchi, good excursion bilaterally Heart regular rate and rhythm, no murmur appreciated Abd soft, nontender, positive bowel sounds MSK no focal spinal tenderness, no joint edema Neuro: non-focal, well-oriented, appropriate  affect Breasts: Deferred   Lab Results  Component Value Date   WBC 5.5 09/07/2020   HGB 16.1 (H) 09/07/2020   HCT 49.2 (H) 09/07/2020   MCV 101.7 (H) 09/07/2020   PLT 384 09/07/2020   Lab Results  Component Value Date   FERRITIN 50 07/19/2020   IRON 42 07/19/2020   TIBC 358 07/19/2020   UIBC 315 07/19/2020   IRONPCTSAT 12 (L) 07/19/2020   Lab Results  Component Value Date   RETICCTPCT 0.8 02/02/2015   RBC 4.84 09/07/2020   RETICCTABS 41.4 02/02/2015   No results found for: KPAFRELGTCHN, LAMBDASER, KAPLAMBRATIO No results found for: Kandis Cocking, IGMSERUM No results found for: Odetta Pink, SPEI   Chemistry      Component Value Date/Time   NA 136 07/25/2020 1324   NA 142 03/25/2017 1457   NA 139 04/10/2016 0855   K 4.5 07/25/2020 1324   K 4.1 03/25/2017 1457   K 4.2 04/10/2016 0855   CL 99 07/25/2020 1324   CL 96 (L) 03/25/2017 1457   CO2 29 07/25/2020 1324   CO2 32 03/25/2017 1457   CO2 29 04/10/2016 0855   BUN 9 07/25/2020 1324   BUN 7 03/25/2017 1457   BUN 10.0 04/10/2016 0855   CREATININE 0.79 07/25/2020 1324   CREATININE 0.69 07/19/2020 1314   CREATININE 0.55 (L) 04/22/2020 1444   CREATININE 0.8 04/10/2016 0855      Component Value Date/Time   CALCIUM 9.9 07/25/2020 1324   CALCIUM 9.8 03/25/2017 1457   CALCIUM 9.5 04/10/2016 0855   ALKPHOS 93 07/19/2020 1314   ALKPHOS 86 (H) 03/25/2017 1457   ALKPHOS 85 04/10/2016 0855   AST 16 07/19/2020 1314   AST 19 04/10/2016 0855   ALT 9 07/19/2020 1314   ALT 15 03/25/2017 1457   ALT 13 04/10/2016 0855   BILITOT 1.0 07/19/2020 1314   BILITOT 1.26 (H) 04/10/2016 0855       Impression and Plan: Ms. Fleece is a very pleasant 87 yo caucasian female with polycythemia and history of stage I infiltrating ductal carcinoma of the left breast with lumpectomy in 2013as well as polycythemia, JAK2 positive. We will phlebotomize her today for Hct 49.2%.  She  also received PO benadryl, IV pepcid and solumedrol to help with rash.  Referral placed for dermatology.  Iron studies pending.  No changes to Hydrea regimen at this time.  Follow-up in 6  weeks.  She can contact our office with any questions or concerns.   [name], NP 4/27/202211:19 AM

## 2020-09-07 NOTE — Progress Notes (Signed)
[name] presents today for phlebotomy per MD orders. Phlebotomy procedure started at 1150 and ended at 12:10 PM 505 grams removed via 20 gauge needle to right AC.  Patient observed for 30 minutes after procedure without any incident. Patient tolerated procedure well and received replacement fluids after procedure, also received IV meds per physician order for a rash.  Patient understands to call if he has any questions or concerns post discharge.

## 2020-09-08 ENCOUNTER — Telehealth: Payer: Self-pay | Admitting: *Deleted

## 2020-09-08 LAB — IRON AND TIBC
Iron: 190 ug/dL — ABNORMAL HIGH (ref 41–142)
Saturation Ratios: 72 % — ABNORMAL HIGH (ref 21–57)
TIBC: 263 ug/dL (ref 236–444)
UIBC: 73 ug/dL — ABNORMAL LOW (ref 120–384)

## 2020-09-08 LAB — FERRITIN: Ferritin: 538 ng/mL — ABNORMAL HIGH (ref 11–307)

## 2020-09-08 NOTE — Telephone Encounter (Signed)
Per los called and lvm of upcoming appointments - mailed calendar

## 2020-09-19 ENCOUNTER — Inpatient Hospital Stay: Payer: Medicare Other | Admitting: Family

## 2020-09-19 ENCOUNTER — Inpatient Hospital Stay: Payer: Medicare Other | Attending: Hematology & Oncology

## 2020-09-19 ENCOUNTER — Inpatient Hospital Stay: Payer: Medicare Other

## 2020-09-19 ENCOUNTER — Telehealth: Payer: Self-pay

## 2020-09-19 NOTE — Telephone Encounter (Signed)
Called pt to r/s her no show today and she does not have a vm set up, will try later   [name]

## 2020-09-21 ENCOUNTER — Other Ambulatory Visit: Payer: Self-pay | Admitting: Family

## 2020-09-21 DIAGNOSIS — D751 Secondary polycythemia: Secondary | ICD-10-CM

## 2020-09-22 ENCOUNTER — Other Ambulatory Visit: Payer: Self-pay | Admitting: Family

## 2020-09-22 ENCOUNTER — Other Ambulatory Visit (HOSPITAL_BASED_OUTPATIENT_CLINIC_OR_DEPARTMENT_OTHER): Payer: Self-pay

## 2020-09-22 DIAGNOSIS — R21 Rash and other nonspecific skin eruption: Secondary | ICD-10-CM

## 2020-09-22 MED ORDER — VALACYCLOVIR HCL 500 MG PO TABS
ORAL_TABLET | ORAL | 1 refills | Status: DC
  Filled 2020-09-22: qty 30, 7d supply, fill #0

## 2020-09-23 ENCOUNTER — Other Ambulatory Visit (HOSPITAL_BASED_OUTPATIENT_CLINIC_OR_DEPARTMENT_OTHER): Payer: Self-pay

## 2020-10-03 ENCOUNTER — Encounter (HOSPITAL_COMMUNITY): Payer: Self-pay

## 2020-10-03 ENCOUNTER — Inpatient Hospital Stay (HOSPITAL_COMMUNITY)
Admission: EM | Admit: 2020-10-03 | Discharge: 2020-10-06 | DRG: 690 | Disposition: A | Discharge status: Home Health | Payer: Medicare Other | Attending: Internal Medicine | Admitting: Internal Medicine

## 2020-10-03 ENCOUNTER — Emergency Department (HOSPITAL_COMMUNITY): Payer: Medicare Other

## 2020-10-03 DIAGNOSIS — M545 Low back pain, unspecified: Secondary | ICD-10-CM | POA: Diagnosis present

## 2020-10-03 DIAGNOSIS — I2510 Atherosclerotic heart disease of native coronary artery without angina pectoris: Secondary | ICD-10-CM | POA: Diagnosis present

## 2020-10-03 DIAGNOSIS — I429 Cardiomyopathy, unspecified: Secondary | ICD-10-CM | POA: Diagnosis present

## 2020-10-03 DIAGNOSIS — D259 Leiomyoma of uterus, unspecified: Secondary | ICD-10-CM | POA: Diagnosis not present

## 2020-10-03 DIAGNOSIS — R627 Adult failure to thrive: Secondary | ICD-10-CM | POA: Diagnosis not present

## 2020-10-03 DIAGNOSIS — M25552 Pain in left hip: Secondary | ICD-10-CM | POA: Diagnosis not present

## 2020-10-03 DIAGNOSIS — Z853 Personal history of malignant neoplasm of breast: Secondary | ICD-10-CM | POA: Diagnosis not present

## 2020-10-03 DIAGNOSIS — G4733 Obstructive sleep apnea (adult) (pediatric): Secondary | ICD-10-CM | POA: Diagnosis present

## 2020-10-03 DIAGNOSIS — I10 Essential (primary) hypertension: Secondary | ICD-10-CM | POA: Diagnosis present

## 2020-10-03 DIAGNOSIS — G8929 Other chronic pain: Secondary | ICD-10-CM | POA: Diagnosis present

## 2020-10-03 DIAGNOSIS — Z886 Allergy status to analgesic agent status: Secondary | ICD-10-CM

## 2020-10-03 DIAGNOSIS — B9620 Unspecified Escherichia coli [E. coli] as the cause of diseases classified elsewhere: Secondary | ICD-10-CM | POA: Diagnosis present

## 2020-10-03 DIAGNOSIS — M170 Bilateral primary osteoarthritis of knee: Secondary | ICD-10-CM | POA: Diagnosis present

## 2020-10-03 DIAGNOSIS — H9190 Unspecified hearing loss, unspecified ear: Secondary | ICD-10-CM | POA: Diagnosis present

## 2020-10-03 DIAGNOSIS — E785 Hyperlipidemia, unspecified: Secondary | ICD-10-CM | POA: Diagnosis present

## 2020-10-03 DIAGNOSIS — N10 Acute tubulo-interstitial nephritis: Secondary | ICD-10-CM

## 2020-10-03 DIAGNOSIS — R531 Weakness: Secondary | ICD-10-CM | POA: Diagnosis present

## 2020-10-03 DIAGNOSIS — R441 Visual hallucinations: Secondary | ICD-10-CM | POA: Diagnosis present

## 2020-10-03 DIAGNOSIS — Z20822 Contact with and (suspected) exposure to covid-19: Secondary | ICD-10-CM | POA: Diagnosis present

## 2020-10-03 DIAGNOSIS — Z1623 Resistance to quinolones and fluoroquinolones: Secondary | ICD-10-CM | POA: Diagnosis present

## 2020-10-03 DIAGNOSIS — M4699 Unspecified inflammatory spondylopathy, multiple sites in spine: Secondary | ICD-10-CM | POA: Diagnosis not present

## 2020-10-03 DIAGNOSIS — I5032 Chronic diastolic (congestive) heart failure: Secondary | ICD-10-CM | POA: Diagnosis present

## 2020-10-03 DIAGNOSIS — I110 Hypertensive heart disease with heart failure: Secondary | ICD-10-CM | POA: Diagnosis present

## 2020-10-03 DIAGNOSIS — M47816 Spondylosis without myelopathy or radiculopathy, lumbar region: Secondary | ICD-10-CM | POA: Diagnosis not present

## 2020-10-03 DIAGNOSIS — Z880 Allergy status to penicillin: Secondary | ICD-10-CM

## 2020-10-03 DIAGNOSIS — Z681 Body mass index (BMI) 19 or less, adult: Secondary | ICD-10-CM | POA: Diagnosis not present

## 2020-10-03 DIAGNOSIS — R636 Underweight: Secondary | ICD-10-CM | POA: Diagnosis present

## 2020-10-03 DIAGNOSIS — N390 Urinary tract infection, site not specified: Secondary | ICD-10-CM

## 2020-10-03 DIAGNOSIS — D751 Secondary polycythemia: Secondary | ICD-10-CM | POA: Diagnosis present

## 2020-10-03 DIAGNOSIS — M48061 Spinal stenosis, lumbar region without neurogenic claudication: Secondary | ICD-10-CM | POA: Diagnosis not present

## 2020-10-03 DIAGNOSIS — K5730 Diverticulosis of large intestine without perforation or abscess without bleeding: Secondary | ICD-10-CM | POA: Diagnosis not present

## 2020-10-03 DIAGNOSIS — Z887 Allergy status to serum and vaccine status: Secondary | ICD-10-CM

## 2020-10-03 DIAGNOSIS — Z8249 Family history of ischemic heart disease and other diseases of the circulatory system: Secondary | ICD-10-CM

## 2020-10-03 DIAGNOSIS — Z8719 Personal history of other diseases of the digestive system: Secondary | ICD-10-CM

## 2020-10-03 DIAGNOSIS — Z87891 Personal history of nicotine dependence: Secondary | ICD-10-CM

## 2020-10-03 DIAGNOSIS — Z66 Do not resuscitate: Secondary | ICD-10-CM | POA: Diagnosis present

## 2020-10-03 DIAGNOSIS — Z8673 Personal history of transient ischemic attack (TIA), and cerebral infarction without residual deficits: Secondary | ICD-10-CM

## 2020-10-03 DIAGNOSIS — Z91048 Other nonmedicinal substance allergy status: Secondary | ICD-10-CM

## 2020-10-03 DIAGNOSIS — K449 Diaphragmatic hernia without obstruction or gangrene: Secondary | ICD-10-CM | POA: Diagnosis not present

## 2020-10-03 DIAGNOSIS — M810 Age-related osteoporosis without current pathological fracture: Secondary | ICD-10-CM | POA: Diagnosis present

## 2020-10-03 DIAGNOSIS — K5901 Slow transit constipation: Secondary | ICD-10-CM | POA: Diagnosis present

## 2020-10-03 DIAGNOSIS — M5126 Other intervertebral disc displacement, lumbar region: Secondary | ICD-10-CM | POA: Diagnosis not present

## 2020-10-03 DIAGNOSIS — Z7982 Long term (current) use of aspirin: Secondary | ICD-10-CM

## 2020-10-03 DIAGNOSIS — Z9049 Acquired absence of other specified parts of digestive tract: Secondary | ICD-10-CM

## 2020-10-03 DIAGNOSIS — R519 Headache, unspecified: Secondary | ICD-10-CM | POA: Diagnosis not present

## 2020-10-03 DIAGNOSIS — Z888 Allergy status to other drugs, medicaments and biological substances status: Secondary | ICD-10-CM

## 2020-10-03 DIAGNOSIS — Z79899 Other long term (current) drug therapy: Secondary | ICD-10-CM

## 2020-10-03 DIAGNOSIS — Z86718 Personal history of other venous thrombosis and embolism: Secondary | ICD-10-CM

## 2020-10-03 DIAGNOSIS — Z9181 History of falling: Secondary | ICD-10-CM

## 2020-10-03 DIAGNOSIS — K219 Gastro-esophageal reflux disease without esophagitis: Secondary | ICD-10-CM | POA: Diagnosis present

## 2020-10-03 LAB — COMPREHENSIVE METABOLIC PANEL
ALT: 12 U/L (ref 0–44)
AST: 17 U/L (ref 15–41)
Albumin: 3.6 g/dL (ref 3.5–5.0)
Alkaline Phosphatase: 63 U/L (ref 38–126)
Anion gap: 7 (ref 5–15)
BUN: 13 mg/dL (ref 8–23)
CO2: 31 mmol/L (ref 22–32)
Calcium: 9.5 mg/dL (ref 8.9–10.3)
Chloride: 97 mmol/L — ABNORMAL LOW (ref 98–111)
Creatinine, Ser: 0.72 mg/dL (ref 0.44–1.00)
GFR, Estimated: >60 mL/min (ref >60)
Glucose, Bld: 120 mg/dL — ABNORMAL HIGH (ref 70–99)
Potassium: 4.3 mmol/L (ref 3.5–5.1)
Sodium: 135 mmol/L (ref 135–145)
Total Bilirubin: 1.5 mg/dL — ABNORMAL HIGH (ref 0.3–1.2)
Total Protein: 6.6 g/dL (ref 6.5–8.1)

## 2020-10-03 LAB — URINALYSIS, ROUTINE W REFLEX MICROSCOPIC
Bilirubin Urine: NEGATIVE
Glucose, UA: NEGATIVE mg/dL
Ketones, ur: 5 mg/dL — AB
Nitrite: NEGATIVE
Protein, ur: NEGATIVE mg/dL
Specific Gravity, Urine: 1.006 (ref 1.005–1.030)
pH: 6 (ref 5.0–8.0)

## 2020-10-03 LAB — CBC WITH DIFFERENTIAL/PLATELET
Abs Immature Granulocytes: 0.02 10*3/uL (ref 0.00–0.07)
Basophils Absolute: 0 10*3/uL (ref 0.0–0.1)
Basophils Relative: 0 %
Eosinophils Absolute: 0 10*3/uL (ref 0.0–0.5)
Eosinophils Relative: 1 %
HCT: 41.3 % (ref 36.0–46.0)
Hemoglobin: 13.4 g/dL (ref 12.0–15.0)
Immature Granulocytes: 0 %
Lymphocytes Relative: 7 %
Lymphs Abs: 0.3 10*3/uL — ABNORMAL LOW (ref 0.7–4.0)
MCH: 34.4 pg — ABNORMAL HIGH (ref 26.0–34.0)
MCHC: 32.4 g/dL (ref 30.0–36.0)
MCV: 105.9 fL — ABNORMAL HIGH (ref 80.0–100.0)
Monocytes Absolute: 0.5 10*3/uL (ref 0.1–1.0)
Monocytes Relative: 10 %
Neutro Abs: 4.2 10*3/uL (ref 1.7–7.7)
Neutrophils Relative %: 82 %
Platelets: 311 10*3/uL (ref 150–400)
RBC: 3.9 MIL/uL (ref 3.87–5.11)
RDW: 15.8 % — ABNORMAL HIGH (ref 11.5–15.5)
WBC: 5.1 10*3/uL (ref 4.0–10.5)
nRBC: 0 % (ref 0.0–0.2)

## 2020-10-03 LAB — LIPASE, BLOOD: Lipase: 25 U/L (ref 11–51)

## 2020-10-03 LAB — LACTIC ACID, PLASMA: Lactic Acid, Venous: 0.7 mmol/L (ref 0.5–1.9)

## 2020-10-03 MED ORDER — METOPROLOL TARTRATE 25 MG PO TABS
50.0000 mg | ORAL_TABLET | Freq: Two times a day (BID) | ORAL | Status: DC
  Administered 2020-10-03 – 2020-10-06 (×6): 50 mg via ORAL
  Filled 2020-10-03 (×6): qty 2

## 2020-10-03 MED ORDER — LACTATED RINGERS IV SOLN: INTRAVENOUS | Status: DC

## 2020-10-03 MED ORDER — SODIUM CHLORIDE 0.9 % IV SOLN
1.0000 g | Freq: Once | INTRAVENOUS | Status: AC
  Administered 2020-10-03: 1 g via INTRAVENOUS
  Filled 2020-10-03: qty 10

## 2020-10-03 MED ORDER — ASPIRIN EC 81 MG PO TBEC
81.0000 mg | DELAYED_RELEASE_TABLET | Freq: Every day | ORAL | Status: DC
  Administered 2020-10-04 – 2020-10-06 (×3): 81 mg via ORAL
  Filled 2020-10-03 (×3): qty 1

## 2020-10-03 MED ORDER — FENTANYL CITRATE (PF) 100 MCG/2ML IJ SOLN
25.0000 ug | Freq: Once | INTRAMUSCULAR | Status: AC
  Administered 2020-10-03: 25 ug via INTRAVENOUS
  Filled 2020-10-03: qty 2

## 2020-10-03 MED ORDER — HYDROCODONE-ACETAMINOPHEN 5-325 MG PO TABS
1.0000 | ORAL_TABLET | Freq: Four times a day (QID) | ORAL | Status: DC | PRN
  Administered 2020-10-04 – 2020-10-05 (×5): 2 via ORAL
  Filled 2020-10-03 (×5): qty 2

## 2020-10-03 MED ORDER — LORAZEPAM 0.5 MG PO TABS
0.5000 mg | ORAL_TABLET | Freq: Two times a day (BID) | ORAL | Status: DC | PRN
  Administered 2020-10-04 – 2020-10-06 (×4): 0.5 mg via ORAL
  Filled 2020-10-03 (×4): qty 1

## 2020-10-03 MED ORDER — CALCIUM CARBONATE ANTACID 500 MG PO CHEW
1.0000 | CHEWABLE_TABLET | Freq: Once | ORAL | Status: AC
  Administered 2020-10-03: 200 mg via ORAL
  Filled 2020-10-03: qty 1

## 2020-10-03 MED ORDER — ENOXAPARIN SODIUM 40 MG/0.4ML IJ SOSY
40.0000 mg | PREFILLED_SYRINGE | INTRAMUSCULAR | Status: DC
  Administered 2020-10-03 – 2020-10-05 (×3): 40 mg via SUBCUTANEOUS
  Filled 2020-10-03 (×3): qty 0.4

## 2020-10-03 MED ORDER — SODIUM CHLORIDE 0.9 % IV SOLN
1.0000 g | INTRAVENOUS | Status: DC
  Administered 2020-10-04 – 2020-10-05 (×2): 1 g via INTRAVENOUS
  Filled 2020-10-03: qty 1
  Filled 2020-10-03: qty 10

## 2020-10-03 MED ORDER — NITROGLYCERIN 0.4 MG SL SUBL: 0.4000 mg | SUBLINGUAL_TABLET | SUBLINGUAL | Status: DC | PRN

## 2020-10-03 MED ORDER — POLYETHYLENE GLYCOL 3350 17 G PO PACK
17.0000 g | PACK | Freq: Every day | ORAL | Status: DC | PRN
  Administered 2020-10-05: 17 g via ORAL
  Filled 2020-10-03: qty 1

## 2020-10-03 MED ORDER — SODIUM CHLORIDE 0.9 % IV BOLUS
1000.0000 mL | Freq: Once | INTRAVENOUS | Status: AC
  Administered 2020-10-03: 1000 mL via INTRAVENOUS

## 2020-10-03 NOTE — ED Provider Notes (Signed)
[facility] DEPT Provider Note   CSN: [id number] Arrival date & time: 10/03/20  1353     History Chief Complaint  Patient presents with  . Back Pain  . Hip Pain    [name] is a 87 y.o. female.  HPI     87 year old female with a history of cardiomyopathy, mild obstructive nonobstructive coronary artery disease, DVT, CVA, L2 fracture with history of vertebroplasty with IR, thoracic compression fracture hypertension, hyperlipidemia, OSA, breast cancer, who presents with concern for back pain and urinary frequency.  Reports that she had hip surgery last August and has had intermittent issues with her bilateral hips and lower back.  She was lifting 1 week ago and had worsening of pain.  Denies radiation of pain, numbness or tingling.  1 week ago changing the bed, lifted the corners to tuck the sheets, got worse (has had pain for a long time)-that Wednesday night had to urinate frequently, large amount of urine, 4 times overnight, once laid down had to urinate would urinate a lot, has had continued night time urinary frequency  Pain bilateral hips and sometimes across the lower abdomen 3 vertebrae repaired over last few years, feels like that, didn't feel like it was much better after last injection  No difficulty urinating, but then does report some feeling of needing urinate during the day and can't but at nigth is able to urinate large amounts and going frequently  No loss of bowel control No numbness or weakness No fever, no nausea or vomiting  Has fallen but not recently, June of last year, was in SNF for 3 mos  Lives at home with cat, husband died 60y ago, daughter 3y ago  Walking with walker this last week but with pain, however has mostly be laying in bed the last few days, too weak to get up Reports her dr. Cristi Loron the pain medications in December, thought she was taking too much.  Has been taking tylenol but ran out.    Reports  taking "toprol" last night that she said she read would help with pain and help her sleep.   Past Medical History:  Diagnosis Date  . CAD (coronary artery disease)    mild nonobstructive disease on cath in 2003  . Cancer ([place])   . Cardiomyopathy    Probable Takotsubo, severe CP w/ normal cath in 1994. Severe CP in 2003 w/ widespread T wave inversions on ECG. Cath w/ minimal coronary disease but LV-gram showed periapical severe hypokinesis and basilar hyperkinesia (EF 40%). Last echo in 4/09 confirmed full LV functional recovery with EF 60%, no regional wall motion abnormalities, mild to moderate LVH.  Marland Kitchen Chronic diastolic CHF (congestive heart failure) ([place]) 09/13/2019  . CVA (cerebral infarction)   . Depression with anxiety 03-29-11   lost husband 3'09  . Diverticulitis   . DVT (deep venous thrombosis) ([place])    after venous ablation, R leg  . Dyspnea   . E. coli gastroenteritis 03-29-11   8'10  . Fracture 09/10/07   L2, status post vertebroplasty of L2 performed by IR  . GERD (gastroesophageal reflux disease)   . Headache(784.0)    migraines  . Hemorrhoids   . Hiatal hernia 03-29-11   no nerve problems  . Hyperlipidemia   . Hyperplastic colon polyp 06/2001  . Hypertension   . Iron deficiency anemia due to chronic blood loss 12/30/2017  . OA (osteoarthritis) of knee 03-29-11   w/ bilateral knee pain-not a problem now  .  OSA (obstructive sleep apnea) 03-29-11   no cpap used, not a problem now.  . Osteoporosis   . Otalgia of both ears    Dr. Simeon Craft  . Polycythemia   . Stroke [facility]) 03-29-11   CVA x2 -last 10'12-?TIA(visual problems)  . Varicose vein     Patient Active Problem List   Diagnosis Date Noted  . UTI (urinary tract infection) 10/03/2020  . Generalized weakness 10/03/2020  . Otalgia of both ears   . Hypertension   . GERD (gastroesophageal reflux disease)   . Dyspnea   . DVT (deep venous thrombosis) ([place])   . Diverticulitis   . Cancer ([place])   . CAD (coronary artery  disease)   . Acute midline thoracic back pain 03/04/2020  . Malnutrition of moderate degree 12/27/2019  . Pressure injury of skin 12/27/2019  . S/p left hip fracture   . Comminuted fracture of hip, left, open type I or II, initial encounter ([place]) 12/24/2019  . Closed left hip fracture ([place]) 12/24/2019  . ICH (intracerebral hemorrhage) ([place]) 11/04/2019  . Intractable back pain 09/13/2019  . Compression fracture of T9 vertebra ([place]) 09/13/2019  . Chronic diastolic CHF (congestive heart failure) ([place]) 09/13/2019  . Overweight (BMI 25.0-29.9) 09/13/2019  . Constipation 09/13/2019  . Raynaud's phenomenon without gangrene 05/19/2018  . Iron deficiency anemia due to chronic blood loss 12/30/2017  . Insomnia 06/25/2017  . Mild CAD 05/24/2017  . PCP NOTES >>> 02/15/2015  . Superficial thrombophlebitis 04/06/2014  . Annual physical exam 08/18/2012  . Edema 04/14/2012  . Breast cancer ([place]) 11/05/2011  . Polycythemia 11/05/2011  . Stroke ([place]) 03/29/2011  . OSA (obstructive sleep apnea) 03/29/2011  . OA (osteoarthritis) of knee 03/29/2011  . Hiatal hernia 03/29/2011  . E. coli gastroenteritis 03/29/2011  . Depression with anxiety 03/29/2011  . Chest pain 01/16/2011  . TIA (transient ischemic attack) 01/16/2011  . CARDIOMYOPATHY 08/03/2009  . DJD -- pain mgmt 08/16/2008  . ABDOMINAL PAIN, LEFT LOWER QUADRANT 07/05/2008  . GAIT DISTURBANCE 10/21/2007  . ANXIETY DEPRESSION 10/08/2007  . Fracture 09/10/2007  . Hyperlipidemia 06/25/2007  . Takotsubo cardiomyopathy 06/25/2007  . GERD without esophagitis 06/25/2007  . Essential hypertension 09/30/2006  . Senile osteoporosis 09/30/2006  . Migraine with aura 09/30/2006  . Hyperplastic colon polyp 06/2001    Past Surgical History:  Procedure Laterality Date  . APPENDECTOMY    . BACK SURGERY    . BREAST BIOPSY    . BREAST CYST EXCISION    . BREAST LUMPECTOMY Left 2013   left stage I left breast cancer  . CHOLECYSTECTOMY  04/09/2011    Procedure: LAPAROSCOPIC CHOLECYSTECTOMY WITH INTRAOPERATIVE CHOLANGIOGRAM;  Surgeon: Odis Hollingshead, MD;  Location: [facility] ORS;  Service: General;  Laterality: N/A;  . Dental Extraction     L maxillary molar  . INTRAMEDULLARY (IM) NAIL INTERTROCHANTERIC Left 12/25/2019   Procedure: INTRAMEDULLARY (IM) NAIL INTERTROCHANTRIC;  Surgeon: Paralee Cancel, MD;  Location: [facility] ORS;  Service: Orthopedics;  Laterality: Left;  . IR KYPHO THORACIC WITH BONE BIOPSY  09/15/2019  . IR KYPHO THORACIC WITH BONE BIOPSY  07/13/2020      . IR KYPHO THORACIC WITH BONE BIOPSY  07/13/2020  . IR VERTEBROPLASTY CERV/THOR BX INC UNI/BIL INC/INJECT/IMAGING  11/10/2019  . KYPHOSIS SURGERY  08/2007   cement used  . OVARIAN CYST SURGERY     left  . RADIOLOGY WITH ANESTHESIA N/A 11/10/2019   Procedure: VERTEBROPLASTY;  Surgeon: Luanne Bras, MD;  Location: [facility];  Service: Radiology;  Laterality: N/A;  . TONSILLECTOMY    . TOTAL KNEE ARTHROPLASTY  06/2010   left  . TUBAL LIGATION       OB History    Gravida  3   Para  3   Term      Preterm      AB      Living  3     SAB      IAB      Ectopic      Multiple      Live Births              Family History  Problem Relation Age of Onset  . Breast cancer Mother   . Heart disease Father        MIs  . Colon cancer Other        cousin  . Breast cancer Sister   . Leukemia Brother        GM    Social History   Tobacco Use  . Smoking status: Former Smoker    Packs/day: 1.00    Years: 37.00    Pack years: 37.00    Types: Cigarettes    Start date: 06/26/1949    Quit date: 05/14/1988    Years since quitting: 32.4  . Smokeless tobacco: Never Used  . Tobacco comment: quit 25 years ago  Vaping Use  . Vaping Use: Never used  Substance Use Topics  . Alcohol use: Yes    Alcohol/week: 1.0 standard drink    Types: 1 Glasses of wine per week    Comment: occasional/social  . Drug use: No    Home Medications Prior to Admission medications    Medication Sig Start Date End Date Taking? Authorizing Provider  alendronate (FOSAMAX) 70 MG tablet Take 1 tablet (70 mg total) by mouth every 7 (seven) days. Take with a full glass of water on an empty stomach. 06/08/20  Yes Colon Branch, MD  aspirin 81 MG EC tablet Take 1 tablet (81 mg total) by mouth daily. Swallow whole. 02/22/20  Yes Paz, Alda Berthold, MD  bismuth subsalicylate (PEPTO BISMOL) 262 MG/15ML suspension Take 30 mLs by mouth every 4 (four) hours as needed for indigestion (heartburn).    Yes [provider]  furosemide (LASIX) 20 MG tablet Take 1 tablet (20 mg total) by mouth daily. Patient taking differently: Take 20 mg by mouth daily as needed for fluid or edema. 06/08/20  Yes Paz, Alda Berthold, MD  hydroxyurea (HYDREA) 500 MG capsule TAKE 1 CAPSULE BY MOUTH EVERY DAY. MAY TAKE WITH FOOD TO MINIMIZE GI SIDE EFFECTS Patient taking differently: Take 500 mg by mouth daily. 09/21/20  Yes [place], Holli Humbles, NP  lactose free nutrition (BOOST) LIQD Take 237 mLs by mouth daily.   Yes [provider]  Lidocaine 4 % PTCH LIDOCAINE 4% PATCH :Apply 1 patch topically daily and remove after 12 hours to left lateral chest Patient taking differently: Apply 1 patch topically daily. Apply to left lateral chest. Remove after 12 hours. 11/12/19  Yes Rai, Ripudeep K, MD  LORazepam (ATIVAN) 0.5 MG tablet TAKE 1/2 TABLET BY MOUTH EVERY MORNING AND EVERY AFTERNOON THEN TAKE 2 TABLETS BY MOUTH AT BEDTIME AS NEEDED Patient taking differently: Take 0.25 mg by mouth every 8 (eight) hours as needed for anxiety. 08/15/20  Yes Paz, Alda Berthold, MD  metoprolol tartrate (LOPRESSOR) 50 MG tablet Take 1 tablet (50 mg total) by mouth 2 (two) times daily. 10/09/19  Yes Paz, [place]  E, MD  nitroGLYCERIN (NITROSTAT) 0.4 MG SL tablet Place 1 tablet (0.4 mg total) under the tongue every 5 (five) minutes x 3 doses as needed for chest pain. 07/25/20  Yes Paz, Alda Berthold, MD  omeprazole (PRILOSEC) 20 MG capsule Take 1 capsule (20 mg total)  by mouth daily. 10/05/19  Yes [place], Arlo C, PA-C  polyethylene glycol (MIRALAX / GLYCOLAX) 17 g packet Take 17 g by mouth daily.   Yes [provider]  bisacodyl (DULCOLAX) 10 MG suppository Place 1 suppository (10 mg total) rectally daily as needed for moderate constipation. Patient not taking: No sig reported 09/17/19   [name], Corrie Mckusick, MD  cyclobenzaprine (FLEXERIL) 5 MG tablet Take 1 tablet (5 mg total) by mouth 3 (three) times daily as needed for muscle spasms. Patient not taking: No sig reported 07/04/20   Pattricia Boss, MD  methylPREDNISolone (MEDROL DOSEPAK) 4 MG TBPK tablet Take as ordered per pharmacy Patient not taking: No sig reported 09/05/20   Volanda Napoleon, MD  Multiple Vitamin (MULTIVITAMIN WITH MINERALS) TABS tablet Take 1 tablet by mouth daily. Patient not taking: No sig reported 12/29/19   Georgette Shell, MD  ondansetron (ZOFRAN) 4 MG tablet Take 1 tablet (4 mg total) by mouth every 6 (six) hours as needed for nausea or vomiting. Patient not taking: No sig reported 10/05/19   [place] Lewis C, PA-C  valACYclovir (VALTREX) 500 MG tablet TAKE 2 TABLETS BY MOUTH AT ONSET OF RASH AND 2 TABLETS 12 HOURS LATER AS NEEDED Patient not taking: No sig reported 09/22/20 09/22/21  Volanda Napoleon, MD    Allergies    Penicillins, Valsartan, Atorvastatin, Carvedilol, Ezetimibe, Fluvastatin sodium, Magnesium hydroxide, Meloxicam, Pneumovax [pneumococcal polysaccharide vaccine], Quinapril hcl, Simvastatin, Topamax [topiramate], and Vit d-vit e-safflower oil  Review of Systems   Review of Systems  Constitutional: Positive for fatigue. Negative for fever.  HENT: Negative for sore throat.   Eyes: Negative for visual disturbance.  Respiratory: Negative for cough and shortness of breath.   Cardiovascular: Negative for chest pain.  Gastrointestinal: Positive for abdominal pain. Negative for diarrhea, nausea and vomiting.  Genitourinary: Positive for frequency. Negative for difficulty  urinating.  Musculoskeletal: Positive for arthralgias, back pain and gait problem. Negative for neck pain.  Skin: Negative for rash.  Neurological: Negative for syncope, weakness, numbness and headaches.    Physical Exam Updated Vital Signs BP 134/60 (BP Location: Left Arm)   Pulse 60   Temp 97.9 F (36.6 C) (Oral)   Resp 16   Ht 5\' 6"  (1.676 m)   Wt 51.3 kg   SpO2 93%   BMI 18.24 kg/m   Physical Exam Vitals and nursing note reviewed.  Constitutional:      General: She is not in acute distress.    Appearance: She is well-developed. She is not diaphoretic.  HENT:     Head: Normocephalic and atraumatic.  Eyes:     Conjunctiva/sclera: Conjunctivae normal.  Cardiovascular:     Rate and Rhythm: Normal rate and regular rhythm.     Heart sounds: Normal heart sounds. No murmur heard. No friction rub. No gallop.   Pulmonary:     Effort: Pulmonary effort is normal. No respiratory distress.     Breath sounds: Normal breath sounds. No wheezing or rales.  Abdominal:     General: There is no distension.     Palpations: Abdomen is soft.     Tenderness: There is abdominal tenderness (LLQ and suprapubic). There is no guarding.  Musculoskeletal:  General: No tenderness.     Cervical back: Normal range of motion.  Skin:    General: Skin is warm and dry.     Findings: No erythema or rash.  Neurological:     Mental Status: She is alert and oriented to person, place, and time.     ED Results / Procedures / Treatments   Labs (all labs ordered are listed, but only abnormal results are displayed) Labs Reviewed  CBC WITH DIFFERENTIAL/PLATELET - Abnormal; Notable for the following components:      Result Value   MCV 105.9 (*)    MCH 34.4 (*)    RDW 15.8 (*)    Lymphs Abs 0.3 (*)    All other components within normal limits  COMPREHENSIVE METABOLIC PANEL - Abnormal; Notable for the following components:   Chloride 97 (*)    Glucose, Bld 120 (*)    Total Bilirubin 1.5 (*)     All other components within normal limits  URINALYSIS, ROUTINE W REFLEX MICROSCOPIC - Abnormal; Notable for the following components:   Hgb urine dipstick SMALL (*)    Ketones, ur 5 (*)    Leukocytes,Ua LARGE (*)    Bacteria, UA FEW (*)    All other components within normal limits  BASIC METABOLIC PANEL - Abnormal; Notable for the following components:   CO2 34 (*)    Glucose, Bld 104 (*)    Anion gap 3 (*)    All other components within normal limits  CBC - Abnormal; Notable for the following components:   RBC 3.72 (*)    MCV 106.7 (*)    MCH 34.4 (*)    RDW 15.6 (*)    All other components within normal limits  SARS CORONAVIRUS 2 (TAT 6-24 HRS)  URINE CULTURE  CULTURE, BLOOD (ROUTINE X 2)  CULTURE, BLOOD (ROUTINE X 2)  LIPASE, BLOOD  LACTIC ACID, PLASMA  LACTIC ACID, PLASMA    EKG None  Radiology CT ABDOMEN PELVIS WO CONTRAST  Result Date: 10/03/2020 CLINICAL DATA:  Left lower quadrant pain, low back tenderness, with a new compression fracture. EXAM: CT ABDOMEN AND PELVIS WITHOUT CONTRAST TECHNIQUE: Multidetector CT imaging of the abdomen and pelvis was performed following the standard protocol without IV contrast. COMPARISON:  CT spine 07/04/2020, CT pelvis 11/04/2018, CT angiography 09/13/2019 FINDINGS: Lower chest: Bandlike areas of opacity in the lung bases likely reflecting atelectasis and/or scarring. Consolidative opacity. No sizable effusion. Cardiomegaly. Coronary artery calcifications. Hiatal hernia, as below. Hepatobiliary: Scattered hepatic parenchymal calcifications. Smooth liver surface contour. No concerning focal liver lesion. Normal hepatic attenuation. Prior cholecystectomy. No visible intraductal gallstones or significant biliary ductal dilatation accounting for post cholecystectomy reservoir effect and senescent change. Pancreas: No pancreatic ductal dilatation or surrounding inflammatory changes. Spleen: Normal in size. No concerning splenic lesions. Scattered  punctate splenic granulomata. Small accessory spleen inferior to the splenic tip. Adrenals/Urinary Tract: Normal adrenal glands. Kidneys are symmetric in size and normally located. Bilateral extrarenal pelves. No urolithiasis or hydronephrosis. Visible or contour deforming renal lesions. Urinary bladder the mildly thickened accounting for the degree of distention. Stomach/Bowel: Large hiatal hernia, as above. Distal stomach and duodenum are unremarkable. No small bowel thickening or dilatation. The appendix is surgically absent. Moderate colonic stool burden. Scattered distal colonic predominant diverticula without noted as of acute diverticulitis at this time. No colonic dilatation or wall thickening. Vascular/Lymphatic: Atherosclerotic calcification throughout the abdominal aorta and branch vessels. No discernible abdominopelvic adenopathy. Reproductive: Fibroid uterus including a coarsely calcified left lower  uterine segment fibroid. Extensive myometrial calcifications are unchanged from comparison. No worrisome adnexal masses or lesions. Other: Mild circumferential body wall edema. More focal postsurgical stranding superficial to the left hip. No free abdominopelvic fluid or air. Some anterior abdominal wall laxity is unchanged. Musculoskeletal: The osseous structures appear diffusely demineralized which may limit detection of small or nondisplaced fractures. By in the spine are better detailed on dedicated lumbar reconstructions. Degenerative changes in hips pelvis. Prior left femoral intramedullary nail placement with transcervical fixation an. Apposes traumatic deformity the from prior intertrochanteric left femur fracture. Resolution of the previously seen left piriformis intramuscular hemorrhage. No acute or concerning muscular abnormality is seen. IMPRESSION: Colonic diverticulosis without evidence of acute diverticulitis. Moderate colonic stool burden, correlate for features of slow transit/constipation.  Moderate to large hiatal hernia. Evidence of prior granulomatous disease in the in the abdomen. Aortic Atherosclerosis (ICD10-I70.0). Coronary artery atherosclerosis. Prior left femoral intramedullary nail placement and transcervical fixation pin. Remote posttraumatic deformity left inter trochanteric femur fracture. Findings in the spine are better detailed on dedicated CT reconstructions generated and dictated separately. Electronically Signed   By: Lovena Le M.D.   On: 10/03/2020 19:03   CT Head Wo Contrast  Result Date: 10/03/2020 CLINICAL DATA:  Headache, new or worsening, positional (Age 77-49y) EXAM: CT HEAD WITHOUT CONTRAST TECHNIQUE: Contiguous axial images were obtained from the base of the skull through the vertex without intravenous contrast. COMPARISON:  Head CT 02/19/2020 FINDINGS: Brain: Stable degree of atrophy. Periventricular and deep white matter hypodensity consistent with chronic small vessel ischemia, significantly changed. Small right frontal encephalomalacia again seen. Remote lacunar infarct in the right basal ganglia again seen. No hemorrhage. No evidence of acute ischemia or infarct. No midline shift or hydrocephalus. Vascular: Skull base atherosclerosis.  Dolichoectasia. Skull: Prior left occipital fracture partially healed. No acute findings. Sinuses/Orbits: Paranasal sinuses and mastoid air cells are clear. The visualized orbits are unremarkable. Other: None. IMPRESSION: 1. No acute intracranial abnormality. 2. Stable atrophy, chronic small vessel ischemia, and remote infarcts. Electronically Signed   By: Keith Rake M.D.   On: 10/03/2020 19:05   CT L-SPINE NO CHARGE  Result Date: 10/03/2020 CLINICAL DATA:  Bilateral lower back history of prior compression fractures and augmentation EXAM: CT LUMBAR SPINE WITHOUT CONTRAST TECHNIQUE: Multidetector CT imaging of the lumbar spine was performed without intravenous contrast administration. Multiplanar CT image reconstructions  were also generated. COMPARISON:  Contemporary CT abdomen and pelvis., CT L-spine 07/04/2020 FINDINGS: Segmentation: 5 normally formed lumbar type vertebral levels. Multiple prior reports with completing numbering schema. In keeping with most recent numbering convention from CT imaging 07/04/2020, a vertebral augmentation is seen at L2 with the lowest fully formed disc space denoted as L5-S1. Alignment: Very mild levocurvature at the thoracolumbar junction. Mild grade 1 anterolisthesis L5 on S1 is unchanged. No new significant spondylolisthesis or spondylolysis. Vertebrae: Prior vertebral body augmentation at L2 without new vertebral body height loss. Some chronic L3, L4 height loss is unchanged from comparison. Question some degrees T11 inferior endplate though this is a large the characterized on this examination. The osseous structures appear diffusely demineralized which may limit detection of small or nondisplaced fractures. Paraspinal and other soft tissues: No paraspinal fluid, swelling, gas or hemorrhage. No visible canal abnormality is seen within limitations of this unenhanced CT exam. For findings in the posterior abdomen pelvis, see dedicated contemporary CT of the abdomen pelvis from which this study is reconstructed. Disc levels:  Level by level evaluation of the lumbar spine below:  T12-L1: Disc height loss, no significant posterior disc abnormality. No significant spinal canal or foraminal stenosis. L1-L2: Disc height loss. L2 vertebral augmentation. Bilateral facet arthropathy. Global disc bulge. Minimal canal stenosis and bilateral foraminal narrowing. L2-L3: L2 compression deformity post augmentation with retropulsion of the superior endplate by approximately 6 mm. Disc height loss. Mild global disc bulge. Bilateral facet arthropathy. Mild to moderate canal stenosis. Moderate bilateral foraminal narrowing. Disc height loss vacuum disc and global disc bulge facet arthropathy the mild canal stenosis  and moderate bilateral foraminal narrowing. L3-L4: Disc height loss, global disc bulge and vacuum disc phenomenon moderate facet arthropathy. Mild canal stenosis bilateral foraminal narrowing. L4-L5: Disc height loss, shallow global disc bulge vacuum disc. Facet arthropathy. No significant canal stenosis. Mild bilateral foraminal narrowing. L5-S1: Grade 1 anterolisthesis. Global disc bulge. Bilateral facet arthropathy. No significant resulting canal stenosis. Mild bilateral foraminal narrowing. IMPRESSION: 1. Questionable deformity of the inferior endplate T11 though this is on the margins of imaging and incompletely visualized on this exam. Correlate for focal tenderness and there is clinical concern completion imaging could be obtained. 2. Unchanged appearance of a remote deformity and augmentation at L2. Chronic height loss L3 and L4 is also stable. 3. Multilevel discogenic and facet degenerative changes, maximal at the L2-L3 level exacerbated by inferior endplate retropulsion of L2. Described level by level above. 4. The osseous structures appear diffusely demineralized which may limit detection of small or nondisplaced fractures. 5. Dedicated CT of the abdomen and pelvis was performed and dictated separately. Electronically Signed   By: Lovena Le M.D.   On: 10/03/2020 19:22    Procedures Procedures   Medications Ordered in ED Medications  aspirin EC tablet 81 mg (81 mg Oral Given 10/04/20 0801)  metoprolol tartrate (LOPRESSOR) tablet 50 mg (50 mg Oral Given 10/04/20 0801)  nitroGLYCERIN (NITROSTAT) SL tablet 0.4 mg (has no administration in time range)  LORazepam (ATIVAN) tablet 0.5 mg (0.5 mg Oral Given 10/04/20 0801)  enoxaparin (LOVENOX) injection 40 mg (40 mg Subcutaneous Given 10/03/20 2348)  cefTRIAXone (ROCEPHIN) 1 g in sodium chloride 0.9 % 100 mL IVPB (has no administration in time range)  lactated ringers infusion ( Intravenous New Bag/Given 10/03/20 2330)  HYDROcodone-acetaminophen  (NORCO/VICODIN) 5-325 MG per tablet 1-2 tablet (2 tablets Oral Given 10/04/20 0929)  polyethylene glycol (MIRALAX / GLYCOLAX) packet 17 g (has no administration in time range)  fentaNYL (SUBLIMAZE) injection 25 mcg (25 mcg Intravenous Given 10/03/20 1647)  sodium chloride 0.9 % bolus 1,000 mL (1,000 mLs Intravenous New Bag/Given 10/03/20 2004)  cefTRIAXone (ROCEPHIN) 1 g in sodium chloride 0.9 % 100 mL IVPB (0 g Intravenous Stopped 10/03/20 2041)  calcium carbonate (TUMS - dosed in mg elemental calcium) chewable tablet 200 mg of elemental calcium (200 mg of elemental calcium Oral Given 10/03/20 2041)    ED Course  I have reviewed the triage vital signs and the nursing notes.  Pertinent labs & imaging results that were available during my care of the patient were reviewed by me and considered in my medical decision making (see chart for details).    MDM Rules/Calculators/A&P                          87 year old female with a history of cardiomyopathy, mild obstructive nonobstructive coronary artery disease, DVT, CVA, L2 fracture with history of vertebroplasty with IR, thoracic compression fracture hypertension, hyperlipidemia, OSA, breast cancer, who presents with concern for back pain, abdominal pain and urinary  frequency.    Given abdominal tenderness and pain, CT completed which shows no acute intraabdominal findings, multilevel discogenic and facet degnerative changes, no sign of acute fracture.  Labs without acute abnormalities.   UA consistent with UTI and given increased back pain, urinary frequency and urinalysis suspect pyelonephritis.  Given pain and generalized weakness in setting of infection making it difficult to care for herself, will admit for treatment of infection and PT.     Final Clinical Impression(s) / ED Diagnoses Final diagnoses:  Lumbar back pain  Acute pyelonephritis    Rx / DC Orders ED Discharge Orders    None       Gareth Morgan, MD 10/04/20 [phone]

## 2020-10-03 NOTE — ED Triage Notes (Signed)
Pt BIB [facility] from home for bilateral hip pain and lower back pain. Hip surgery last August with intermittent issues, current flare x1 week triggered by lifting. Denies radiation, tingling, loss of sensation. Ambulatory, A&O x4. Sudden onset of incontinence 5 days ago, only in the morning upon waking and sitting up. Denies other urinary sx, fevers.   BP 156/94 HR 90 RR 16 SpO2 98% RA

## 2020-10-03 NOTE — H&P (Signed)
History and Physical    Jackquelyn Sundberg ZHY:[id number] DOB: 03/15/1930 DOA: 10/03/2020  PCP: Colon Branch, MD   Patient coming from:  Home  Chief Complaint: low back pain and lower abdominal pain, urinary frequency, weakness  HPI: [name] is a 87 y.o. female with medical history significant for cardiomyopathy, mild obstructive nonobstructive coronary artery disease, DVT, CVA, L2 fracture with history of vertebroplasty with IR, thoracic compression fracture hypertension, hyperlipidemia, OSA, breast cancer, who presents with concern for lower back and abdominal pain. She reports she has had chronic low back pain and hip pain for years. Reports increased pain in low back for past 5 days. No injury or trauma to back or abdomen. She reports history of falls in past but none recently. She states she was bent over making her bed when she developed increase pain in back 5 days ago. She reports having urinary frequency but no dysuria later that night. Has continued to have frequency. She reports having generalized weakness and having limited ambulation with her walker the past few days. Normally is very independent and can do ADLs.  No numbness of extremities. No fever. Has mild lower abdominal pain that does not radiate. She has no nausea or vomiting. Denies cough, SOB, chest pain, fever, chills.  She lives alone with her cat. No tobacco, alcohol use.   ED Course: Ms. Bigford is found to have leukocytes in urine with negative nitrite. WBC is normal at 5100.  Electrolytes and renal function are normal on labs.  CT of abdomen pelvis shows diverticulosis with no diverticulitis with some constipation.  Kidneys are normal with no signs of stranding or pyelonephritis.  Patient's been hemodynamically stable in the emergency room  Review of Systems:  General: Reports generalized weakness. Denies fever, chills, weight loss, night sweats. Denies dizziness.  Denies change in appetite HENT: Denies  head trauma, headache, denies change in hearing, tinnitus. Denies nasal bleeding.  Denies sore throat, sores in mouth.  Denies difficulty swallowing Eyes: Denies blurry vision, pain in eye, drainage.  Denies discoloration of eyes. Neck: Denies pain.  Denies swelling.  Denies pain with movement. Cardiovascular: Denies chest pain, palpitations. Denies edema. Denies orthopnea Respiratory: Denies shortness of breath, cough.  Denies wheezing.  Denies sputum production Gastrointestinal: Reports lower abdominal pain, swelling.  Denies nausea, vomiting, diarrhea.  Denies melena. Denies hematemesis. Musculoskeletal: Reports chronic low back pain and arthralgias of hips. Denies limitation of movement.  Denies deformity or swelling.  Genitourinary: Reports urinary frequency past few days. Denies pelvic pain. Denies dysuria.  Skin: Denies rash.  Denies petechiae, purpura, ecchymosis. Neurological: Denies headache.  Denies syncope. Denies seizure activity. Denies paresthesia. Denies slurred speech, drooping face.  Denies visual change. Psychiatric: Denies depression, anxiety. Denies hallucinations.  Past Medical History:  Diagnosis Date  . CAD (coronary artery disease)    mild nonobstructive disease on cath in 2003  . Cancer ([place])   . Cardiomyopathy    Probable Takotsubo, severe CP w/ normal cath in 1994. Severe CP in 2003 w/ widespread T wave inversions on ECG. Cath w/ minimal coronary disease but LV-gram showed periapical severe hypokinesis and basilar hyperkinesia (EF 40%). Last echo in 4/09 confirmed full LV functional recovery with EF 60%, no regional wall motion abnormalities, mild to moderate LVH.  Marland Kitchen Chronic diastolic CHF (congestive heart failure) ([place]) 09/13/2019  . CVA (cerebral infarction)   . Depression with anxiety 03-29-11   lost husband 3'09  . Diverticulitis   . DVT (deep venous thrombosis) ([place])  after venous ablation, R leg  . Dyspnea   . E. coli gastroenteritis 03-29-11   8'10  .  Fracture 09/10/07   L2, status post vertebroplasty of L2 performed by IR  . GERD (gastroesophageal reflux disease)   . Headache(784.0)    migraines  . Hemorrhoids   . Hiatal hernia 03-29-11   no nerve problems  . Hyperlipidemia   . Hyperplastic colon polyp 06/2001  . Hypertension   . Iron deficiency anemia due to chronic blood loss 12/30/2017  . OA (osteoarthritis) of knee 03-29-11   w/ bilateral knee pain-not a problem now  . OSA (obstructive sleep apnea) 03-29-11   no cpap used, not a problem now.  . Osteoporosis   . Otalgia of both ears    Dr. Simeon Craft  . Polycythemia   . Stroke [facility]) 03-29-11   CVA x2 -last 10'12-?TIA(visual problems)  . Varicose vein     Past Surgical History:  Procedure Laterality Date  . APPENDECTOMY    . BACK SURGERY    . BREAST BIOPSY    . BREAST CYST EXCISION    . BREAST LUMPECTOMY Left 2013   left stage I left breast cancer  . CHOLECYSTECTOMY  04/09/2011   Procedure: LAPAROSCOPIC CHOLECYSTECTOMY WITH INTRAOPERATIVE CHOLANGIOGRAM;  Surgeon: Odis Hollingshead, MD;  Location: [facility];  Service: General;  Laterality: N/A;  . Dental Extraction     L maxillary molar  . INTRAMEDULLARY (IM) NAIL INTERTROCHANTERIC Left 12/25/2019   Procedure: INTRAMEDULLARY (IM) NAIL INTERTROCHANTRIC;  Surgeon: Paralee Cancel, MD;  Location: [facility];  Service: Orthopedics;  Laterality: Left;  . IR KYPHO THORACIC WITH BONE BIOPSY  09/15/2019  . IR KYPHO THORACIC WITH BONE BIOPSY  07/13/2020      . IR KYPHO THORACIC WITH BONE BIOPSY  07/13/2020  . IR VERTEBROPLASTY CERV/THOR BX INC UNI/BIL INC/INJECT/IMAGING  11/10/2019  . KYPHOSIS SURGERY  08/2007   cement used  . OVARIAN CYST SURGERY     left  . RADIOLOGY WITH ANESTHESIA N/A 11/10/2019   Procedure: VERTEBROPLASTY;  Surgeon: Luanne Bras, MD;  Location: [place];  Service: Radiology;  Laterality: N/A;  . TONSILLECTOMY    . TOTAL KNEE ARTHROPLASTY  06/2010   left  . TUBAL LIGATION      Social History  reports that she quit  smoking about 32 years ago. Her smoking use included cigarettes. She started smoking about 69 years ago. She has a 37.00 pack-year smoking history. She has never used smokeless tobacco. She reports current alcohol use of about 1.0 standard drink of alcohol per week. She reports that she does not use drugs.  Allergies  Allergen Reactions  . Penicillins Hives, Rash and Other (See Comments)    Has patient had a PCN reaction causing immediate rash, facial/tongue/throat swelling, SOB or lightheadedness with hypotension: Yes Has patient had a PCN reaction causing severe rash involving mucus membranes or skin necrosis: yes Has patient had a PCN reaction that required hospitalization: No Has patient had a PCN reaction occurring within the last 10 years: no If all of the above answers are "NO", then may proceed with Cephalosporin use.  Other Reaction: OTHER REACTION  . Valsartan Itching and Other (See Comments)  . Atorvastatin Diarrhea  . Carvedilol Other (See Comments)    "teeth hurt when I took it"   . Ezetimibe Nausea Only  . Fluvastatin Sodium Other (See Comments)    "Can't remember"  . Magnesium Hydroxide Other (See Comments)    REACTION: triggers HAs  .  Meloxicam Other (See Comments)    Pt seeing auro's / spots.    . Pneumovax [Pneumococcal Polysaccharide Vaccine] Other (See Comments)    Local reaction  . Quinapril Hcl Other (See Comments)     "feelings of tiredness"  . Simvastatin Diarrhea  . Topamax [Topiramate] Itching  . Vit D-Vit E-Safflower Oil Other (See Comments)    Headaches    Family History  Problem Relation Age of Onset  . Breast cancer Mother   . Heart disease Father        MIs  . Colon cancer Other        cousin  . Breast cancer Sister   . Leukemia Brother        GM     Prior to Admission medications   Medication Sig Start Date End Date Taking? Authorizing Provider  alendronate (FOSAMAX) 70 MG tablet Take 1 tablet (70 mg total) by mouth every 7 (seven)  days. Take with a full glass of water on an empty stomach. 06/08/20   Colon Branch, MD  aspirin 81 MG EC tablet Take 1 tablet (81 mg total) by mouth daily. Swallow whole. 02/22/20   Colon Branch, MD  bisacodyl (DULCOLAX) 10 MG suppository Place 1 suppository (10 mg total) rectally daily as needed for moderate constipation. 09/17/19   [name], Corrie Mckusick, MD  bismuth subsalicylate (PEPTO BISMOL) 262 MG/15ML suspension Take 30 mLs by mouth every 4 (four) hours as needed for indigestion (heartburn).     [provider]  cyclobenzaprine (FLEXERIL) 5 MG tablet Take 1 tablet (5 mg total) by mouth 3 (three) times daily as needed for muscle spasms. 07/04/20   Pattricia Boss, MD  docusate sodium (COLACE) 100 MG capsule Take 100 mg by mouth 2 (two) times daily as needed.    [provider]  furosemide (LASIX) 20 MG tablet Take 1 tablet (20 mg total) by mouth daily. Patient taking differently: Take 20 mg by mouth daily as needed. 06/08/20   Colon Branch, MD  hydroxyurea (HYDREA) 500 MG capsule TAKE 1 CAPSULE BY MOUTH EVERY DAY. MAY TAKE WITH FOOD TO MINIMIZE GI SIDE EFFECTS 09/21/20   [place], Holli Humbles, NP  Infant Care Products (DERMACLOUD) CREA Apply 1 application topically See admin instructions. Apply to buttocks topically every shift for rash 12/11/19   Colon Branch, MD  lactose free nutrition (BOOST) LIQD Take 237 mLs by mouth See admin instructions. Every shift    [provider]  Lidocaine 4 % PTCH LIDOCAINE 4% PATCH :Apply 1 patch topically daily and remove after 12 hours to left lateral chest Patient taking differently: Apply 1 patch topically daily. Apply to left lateral chest. Remove after 12 hours. 11/12/19   Rai, Ripudeep K, MD  LORazepam (ATIVAN) 0.5 MG tablet TAKE 1/2 TABLET BY MOUTH EVERY MORNING AND EVERY AFTERNOON THEN TAKE 2 TABLETS BY MOUTH AT BEDTIME AS NEEDED 08/15/20   Colon Branch, MD  methylPREDNISolone (MEDROL DOSEPAK) 4 MG TBPK tablet Take as ordered per pharmacy 09/05/20    Volanda Napoleon, MD  metoprolol tartrate (LOPRESSOR) 50 MG tablet Take 1 tablet (50 mg total) by mouth 2 (two) times daily. 10/09/19   Colon Branch, MD  Multiple Vitamin (MULTIVITAMIN WITH MINERALS) TABS tablet Take 1 tablet by mouth daily. 12/29/19   Georgette Shell, MD  nitroGLYCERIN (NITROSTAT) 0.4 MG SL tablet Place 1 tablet (0.4 mg total) under the tongue every 5 (five) minutes x 3 doses as needed for chest pain. 07/25/20  Colon Branch, MD  omeprazole (PRILOSEC) 20 MG capsule Take 1 capsule (20 mg total) by mouth daily. 10/05/19   Granville Lewis C, PA-C  ondansetron (ZOFRAN) 4 MG tablet Take 1 tablet (4 mg total) by mouth every 6 (six) hours as needed for nausea or vomiting. 10/05/19   [place]ville Lewis C, PA-C  polyethylene glycol (MIRALAX / GLYCOLAX) 17 g packet Take 17 g by mouth daily.    [provider]  tiZANidine (ZANAFLEX) 2 MG tablet Take 2 mg by mouth every 6 (six) hours as needed for muscle spasms.    [provider]  valACYclovir (VALTREX) 500 MG tablet TAKE 2 TABLETS BY MOUTH AT ONSET OF RASH AND 2 TABLETS 12 HOURS LATER AS NEEDED 09/22/20 09/22/21  Volanda Napoleon, MD    Physical Exam: Vitals:   10/03/20 1945 10/03/20 2000 10/03/20 2015 10/03/20 2030  BP:  (!) 125/58  (!) 135/118  Pulse: 87 86 89 79  Resp:    16  Temp:      TempSrc:      SpO2: 96% 96% 100% 98%  Weight:      Height:        Constitutional: NAD, calm, comfortable Vitals:   10/03/20 1945 10/03/20 2000 10/03/20 2015 10/03/20 2030  BP:  (!) 125/58  (!) 135/118  Pulse: 87 86 89 79  Resp:    16  Temp:      TempSrc:      SpO2: 96% 96% 100% 98%  Weight:      Height:       General: WDWN, Alert and oriented x3.  Eyes: EOMI, PERRL, conjunctivae normal.  Sclera nonicteric HENT:  [place]/AT, external ears normal.  Nares patent without epistasis.  Mucous membranes are moist. Posterior pharynx clear of any exudate or lesions. Neck: Soft, normal range of motion, supple, no masses, no thyromegaly.  Trachea  midline Respiratory: clear to auscultation bilaterally, no wheezing, no crackles. Normal respiratory effort. No accessory muscle use.  Cardiovascular: Regular rate and rhythm, no murmurs / rubs / gallops. No extremity edema. 2+ pedal pulses. Abdomen: Soft, mild lower abdominal tenderness to palpation, nondistended, no rebound or guarding. No masses palpated. Bowel sounds normoactive. No CVA tenderness to palpation or percussion. Musculoskeletal: FROM. no cyanosis. No joint deformity upper and lower extremities. Normal muscle tone.  Skin: Warm, dry, intact, No lesions, ulcers. No induration Neurologic: CN 2-12 grossly intact. Normal speech.  Sensation intact, Strength 4/5 in all extremities.   Psychiatric: Normal judgment and insight.  Normal mood.    Labs on Admission: I have personally reviewed following labs and imaging studies  CBC: Recent Labs  Lab 10/03/20 1646  WBC 5.1  NEUTROABS 4.2  HGB 13.4  HCT 41.3  MCV 105.9*  PLT 154    Basic Metabolic Panel: Recent Labs  Lab 10/03/20 1646  NA 135  K 4.3  CL 97*  CO2 31  GLUCOSE 120*  BUN 13  CREATININE 0.72  CALCIUM 9.5    GFR: Estimated Creatinine Clearance: 38.6 mL/min (by C-G formula based on SCr of 0.72 mg/dL).  Liver Function Tests: Recent Labs  Lab 10/03/20 1646  AST 17  ALT 12  ALKPHOS 63  BILITOT 1.5*  PROT 6.6  ALBUMIN 3.6    Urine analysis:    Component Value Date/Time   COLORURINE YELLOW 10/03/2020 [place] 10/03/2020 1646   LABSPEC 1.006 10/03/2020 1646   LABSPEC 1.010 05/27/2013 1433   PHURINE 6.0 10/03/2020 1646   GLUCOSEU  NEGATIVE 10/03/2020 1646   GLUCOSEU NEGATIVE 09/02/2017 1347   HGBUR SMALL (A) 10/03/2020 1646   HGBUR negative 10/06/2009 [place] 10/03/2020 1646   BILIRUBINUR Negative 01/21/2018 1618   KETONESUR 5 (A) 10/03/2020 1646   PROTEINUR NEGATIVE 10/03/2020 1646   UROBILINOGEN negative (A) 01/21/2018 1618   UROBILINOGEN 0.2 09/02/2017  1347   UROBILINOGEN 0.2 05/27/2013 1433   NITRITE NEGATIVE 10/03/2020 1646   LEUKOCYTESUR LARGE (A) 10/03/2020 1646    Radiological Exams on Admission: CT ABDOMEN PELVIS WO CONTRAST  Result Date: 10/03/2020 CLINICAL DATA:  Left lower quadrant pain, low back tenderness, with a new compression fracture. EXAM: CT ABDOMEN AND PELVIS WITHOUT CONTRAST TECHNIQUE: Multidetector CT imaging of the abdomen and pelvis was performed following the standard protocol without IV contrast. COMPARISON:  CT spine 07/04/2020, CT pelvis 11/04/2018, CT angiography 09/13/2019 FINDINGS: Lower chest: Bandlike areas of opacity in the lung bases likely reflecting atelectasis and/or scarring. Consolidative opacity. No sizable effusion. Cardiomegaly. Coronary artery calcifications. Hiatal hernia, as below. Hepatobiliary: Scattered hepatic parenchymal calcifications. Smooth liver surface contour. No concerning focal liver lesion. Normal hepatic attenuation. Prior cholecystectomy. No visible intraductal gallstones or significant biliary ductal dilatation accounting for post cholecystectomy reservoir effect and senescent change. Pancreas: No pancreatic ductal dilatation or surrounding inflammatory changes. Spleen: Normal in size. No concerning splenic lesions. Scattered punctate splenic granulomata. Small accessory spleen inferior to the splenic tip. Adrenals/Urinary Tract: Normal adrenal glands. Kidneys are symmetric in size and normally located. Bilateral extrarenal pelves. No urolithiasis or hydronephrosis. Visible or contour deforming renal lesions. Urinary bladder the mildly thickened accounting for the degree of distention. Stomach/Bowel: Large hiatal hernia, as above. Distal stomach and duodenum are unremarkable. No small bowel thickening or dilatation. The appendix is surgically absent. Moderate colonic stool burden. Scattered distal colonic predominant diverticula without noted as of acute diverticulitis at this time. No colonic  dilatation or wall thickening. Vascular/Lymphatic: Atherosclerotic calcification throughout the abdominal aorta and branch vessels. No discernible abdominopelvic adenopathy. Reproductive: Fibroid uterus including a coarsely calcified left lower uterine segment fibroid. Extensive myometrial calcifications are unchanged from comparison. No worrisome adnexal masses or lesions. Other: Mild circumferential body wall edema. More focal postsurgical stranding superficial to the left hip. No free abdominopelvic fluid or air. Some anterior abdominal wall laxity is unchanged. Musculoskeletal: The osseous structures appear diffusely demineralized which may limit detection of small or nondisplaced fractures. By in the spine are better detailed on dedicated lumbar reconstructions. Degenerative changes in hips pelvis. Prior left femoral intramedullary nail placement with transcervical fixation an. Apposes traumatic deformity the from prior intertrochanteric left femur fracture. Resolution of the previously seen left piriformis intramuscular hemorrhage. No acute or concerning muscular abnormality is seen. IMPRESSION: Colonic diverticulosis without evidence of acute diverticulitis. Moderate colonic stool burden, correlate for features of slow transit/constipation. Moderate to large hiatal hernia. Evidence of prior granulomatous disease in the in the abdomen. Aortic Atherosclerosis (ICD10-I70.0). Coronary artery atherosclerosis. Prior left femoral intramedullary nail placement and transcervical fixation pin. Remote posttraumatic deformity left inter trochanteric femur fracture. Findings in the spine are better detailed on dedicated CT reconstructions generated and dictated separately. Electronically Signed   By: Lovena Le M.D.   On: 10/03/2020 19:03   CT Head Wo Contrast  Result Date: 10/03/2020 CLINICAL DATA:  Headache, new or worsening, positional (Age 74-49y) EXAM: CT HEAD WITHOUT CONTRAST TECHNIQUE: Contiguous axial images  were obtained from the base of the skull through the vertex without intravenous contrast. COMPARISON:  Head CT 02/19/2020 FINDINGS: Brain:  Stable degree of atrophy. Periventricular and deep white matter hypodensity consistent with chronic small vessel ischemia, significantly changed. Small right frontal encephalomalacia again seen. Remote lacunar infarct in the right basal ganglia again seen. No hemorrhage. No evidence of acute ischemia or infarct. No midline shift or hydrocephalus. Vascular: Skull base atherosclerosis.  Dolichoectasia. Skull: Prior left occipital fracture partially healed. No acute findings. Sinuses/Orbits: Paranasal sinuses and mastoid air cells are clear. The visualized orbits are unremarkable. Other: None. IMPRESSION: 1. No acute intracranial abnormality. 2. Stable atrophy, chronic small vessel ischemia, and remote infarcts. Electronically Signed   By: Keith Rake M.D.   On: 10/03/2020 19:05   CT L-SPINE NO CHARGE  Result Date: 10/03/2020 CLINICAL DATA:  Bilateral lower back history of prior compression fractures and augmentation EXAM: CT LUMBAR SPINE WITHOUT CONTRAST TECHNIQUE: Multidetector CT imaging of the lumbar spine was performed without intravenous contrast administration. Multiplanar CT image reconstructions were also generated. COMPARISON:  Contemporary CT abdomen and pelvis., CT L-spine 07/04/2020 FINDINGS: Segmentation: 5 normally formed lumbar type vertebral levels. Multiple prior reports with completing numbering schema. In keeping with most recent numbering convention from CT imaging 07/04/2020, a vertebral augmentation is seen at L2 with the lowest fully formed disc space denoted as L5-S1. Alignment: Very mild levocurvature at the thoracolumbar junction. Mild grade 1 anterolisthesis L5 on S1 is unchanged. No new significant spondylolisthesis or spondylolysis. Vertebrae: Prior vertebral body augmentation at L2 without new vertebral body height loss. Some chronic L3, L4  height loss is unchanged from comparison. Question some degrees T11 inferior endplate though this is a large the characterized on this examination. The osseous structures appear diffusely demineralized which may limit detection of small or nondisplaced fractures. Paraspinal and other soft tissues: No paraspinal fluid, swelling, gas or hemorrhage. No visible canal abnormality is seen within limitations of this unenhanced CT exam. For findings in the posterior abdomen pelvis, see dedicated contemporary CT of the abdomen pelvis from which this study is reconstructed. Disc levels:  Level by level evaluation of the lumbar spine below: T12-L1: Disc height loss, no significant posterior disc abnormality. No significant spinal canal or foraminal stenosis. L1-L2: Disc height loss. L2 vertebral augmentation. Bilateral facet arthropathy. Global disc bulge. Minimal canal stenosis and bilateral foraminal narrowing. L2-L3: L2 compression deformity post augmentation with retropulsion of the superior endplate by approximately 6 mm. Disc height loss. Mild global disc bulge. Bilateral facet arthropathy. Mild to moderate canal stenosis. Moderate bilateral foraminal narrowing. Disc height loss vacuum disc and global disc bulge facet arthropathy the mild canal stenosis and moderate bilateral foraminal narrowing. L3-L4: Disc height loss, global disc bulge and vacuum disc phenomenon moderate facet arthropathy. Mild canal stenosis bilateral foraminal narrowing. L4-L5: Disc height loss, shallow global disc bulge vacuum disc. Facet arthropathy. No significant canal stenosis. Mild bilateral foraminal narrowing. L5-S1: Grade 1 anterolisthesis. Global disc bulge. Bilateral facet arthropathy. No significant resulting canal stenosis. Mild bilateral foraminal narrowing. IMPRESSION: 1. Questionable deformity of the inferior endplate T11 though this is on the margins of imaging and incompletely visualized on this exam. Correlate for focal tenderness  and there is clinical concern completion imaging could be obtained. 2. Unchanged appearance of a remote deformity and augmentation at L2. Chronic height loss L3 and L4 is also stable. 3. Multilevel discogenic and facet degenerative changes, maximal at the L2-L3 level exacerbated by inferior endplate retropulsion of L2. Described level by level above. 4. The osseous structures appear diffusely demineralized which may limit detection of small or nondisplaced fractures. 5. Dedicated  CT of the abdomen and pelvis was performed and dictated separately. Electronically Signed   By: Lovena Le M.D.   On: 10/03/2020 19:22    Assessment/Plan Active Problems:   UTI Ms. Duddy is admitted to med/surg floor.  Treat with Rocephin. First dose given in ER.  Gentle IVF hydration overnight.  Cultures obtained in ER and will be monitored. Pt with no fever or elevated WBC. Lactic acid ordered by ER physician and pending.    Essential hypertension Continue home dose of metoprolol. Monitor BP.    Generalized weakness Consult PT for evaluation in am. Fall precautions ordered.      Chronic back pain Pt has chronic low back pain that is not different for her she states. She has no CVA tenderness to palpation on exam. No signs of pyelonephritis on CT scan done in ER.      Constipation Miralax daily. Constipation may be source of lower abdominal pain.    DVT prophylaxis: Padua score elevated.  Lovenox for DVT prophylaxis Code Status:   Full code Family Communication:  Diagnosis and plan discussed with patient.  Patient verbally understand agrees with plan.  Further recommendations to follow as clinical indicated Disposition Plan:   Patient is from:  Home  Anticipated DC to:  Home  Anticipated DC date:  Anticipate 2 midnights in hospital to treat acute condition  Anticipated DC barriers: No barriers to discharge at this time.  If PT determines if patient will      need rehab or home health services will  consult social worker that time  Admission status:    Eben Burow MD [facility]  How to contact the [facility] Attending or Consulting provider [place] or covering provider during after hours [place], for this patient?   1. Check the care team in [facility] and look for a) attending/consulting [facility] provider listed and b) the [facility] team listed 2. Log into [email or web address] and use [place]'s universal password to access. If you do not have the password, please contact the hospital operator. 3. Locate the [facility] provider you are looking for under [facility] and page to a number that you can be directly reached. 4. If you still have difficulty reaching the provider, please page the [facility] (Director on Call) for the Hospitalists listed on amion for assistance.  10/03/2020, 8:39 PM

## 2020-10-03 NOTE — ED Notes (Signed)
Pt to CT

## 2020-10-04 ENCOUNTER — Other Ambulatory Visit: Payer: Self-pay

## 2020-10-04 DIAGNOSIS — I10 Essential (primary) hypertension: Secondary | ICD-10-CM

## 2020-10-04 DIAGNOSIS — N390 Urinary tract infection, site not specified: Secondary | ICD-10-CM | POA: Diagnosis not present

## 2020-10-04 DIAGNOSIS — R531 Weakness: Secondary | ICD-10-CM | POA: Diagnosis not present

## 2020-10-04 LAB — CBC
HCT: 39.7 % (ref 36.0–46.0)
Hemoglobin: 12.8 g/dL (ref 12.0–15.0)
MCH: 34.4 pg — ABNORMAL HIGH (ref 26.0–34.0)
MCHC: 32.2 g/dL (ref 30.0–36.0)
MCV: 106.7 fL — ABNORMAL HIGH (ref 80.0–100.0)
Platelets: 284 10*3/uL (ref 150–400)
RBC: 3.72 MIL/uL — ABNORMAL LOW (ref 3.87–5.11)
RDW: 15.6 % — ABNORMAL HIGH (ref 11.5–15.5)
WBC: 4 10*3/uL (ref 4.0–10.5)
nRBC: 0 % (ref 0.0–0.2)

## 2020-10-04 LAB — BASIC METABOLIC PANEL
Anion gap: 3 — ABNORMAL LOW (ref 5–15)
BUN: 8 mg/dL (ref 8–23)
CO2: 34 mmol/L — ABNORMAL HIGH (ref 22–32)
Calcium: 9.3 mg/dL (ref 8.9–10.3)
Chloride: 104 mmol/L (ref 98–111)
Creatinine, Ser: 0.62 mg/dL (ref 0.44–1.00)
GFR, Estimated: >60 mL/min (ref >60)
Glucose, Bld: 104 mg/dL — ABNORMAL HIGH (ref 70–99)
Potassium: 3.6 mmol/L (ref 3.5–5.1)
Sodium: 141 mmol/L (ref 135–145)

## 2020-10-04 LAB — LACTIC ACID, PLASMA: Lactic Acid, Venous: 0.7 mmol/L (ref 0.5–1.9)

## 2020-10-04 LAB — SARS CORONAVIRUS 2 (TAT 6-24 HRS): SARS Coronavirus 2: NEGATIVE

## 2020-10-04 MED ORDER — CALCIUM CARBONATE ANTACID 500 MG PO CHEW
2.0000 | CHEWABLE_TABLET | Freq: Three times a day (TID) | ORAL | Status: DC | PRN
  Administered 2020-10-04 – 2020-10-06 (×6): 400 mg via ORAL
  Filled 2020-10-04 (×6): qty 2

## 2020-10-04 MED ORDER — FAMOTIDINE 20 MG PO TABS
20.0000 mg | ORAL_TABLET | Freq: Every day | ORAL | Status: DC | PRN
  Administered 2020-10-04 – 2020-10-06 (×3): 20 mg via ORAL
  Filled 2020-10-04 (×3): qty 1

## 2020-10-04 NOTE — Evaluation (Signed)
Physical Therapy Evaluation Patient Details Name: [name] MRN: [id number] DOB: 10/22/1929 Today's Date: 10/04/2020   History of Present Illness  87 y.o. female with medical history significant for cardiomyopathy, mild obstructive nonobstructive coronary artery disease, DVT, CVA, L2 fracture with history of vertebroplasty with IR, thoracic compression fracture hypertension, hyperlipidemia, OSA, breast cancer, who presents with concern for lower back and abdominal pain. She reports she has had chronic low back pain and hip pain for years. Reports increased pain in low back for past 5 days. admitted with UTI  Clinical Impression  Pt admitted with above diagnosis.  Pt reports independence at her baseline however states she had difficulty mobilizing d/t pain. alert and cooperative but questionable STM deficits--?, pt repeats same questions at time of PT eval.  HHPT unless progress slower, then may need SNF  Pt currently with functional limitations due to the deficits listed below (see PT Problem List). Pt will benefit from skilled PT to increase their independence and safety with mobility to allow discharge to the venue listed below.       Follow Up Recommendations Home health PT    Equipment Recommendations  None recommended by PT    Recommendations for Other Services       Precautions / Restrictions Precautions Precautions: Fall Restrictions Weight Bearing Restrictions: No      Mobility  Bed Mobility Overal bed mobility: Needs Assistance Bed Mobility: Supine to Sit     Supine to sit: Min guard     General bed mobility comments: incr time, min/guard to elevate trunk and scoot to EOB    Transfers Overall transfer level: Needs assistance Equipment used: Rolling walker (2 wheeled) Transfers: Sit to/from [facility] Sit to Stand: Min guard Stand pivot transfers: Min guard       General transfer comment: for safety  Ambulation/Gait                 Stairs            Wheelchair Mobility    Modified Rankin (Stroke Patients Only)       Balance                                             Pertinent Vitals/Pain Pain Assessment: Faces Faces Pain Scale: Hurts even more Pain Location: "all over", "back" Pain Descriptors / Indicators: Aching;Grimacing Pain Intervention(s): Repositioned;Monitored during session;Limited activity within patient's tolerance;Premedicated before session    Home Living Family/patient expects to be discharged to:: Private residence Living Arrangements: Alone Available Help at Discharge: Available PRN/intermittently Type of Home: House Home Access: Level entry     Home Layout: One level Home Equipment: Cane - quad;Shower seat;Walker - 2 wheels;Cane - single point      Prior Function Level of Independence: Independent with assistive device(s)         Comments: amb with cane prior to adm     Hand Dominance        Extremity/Trunk Assessment   Upper Extremity Assessment Upper Extremity Assessment: Generalized weakness;Defer to OT evaluation    Lower Extremity Assessment Lower Extremity Assessment: Generalized weakness       Communication   Communication: HOH  Cognition Arousal/Alertness: Awake/alert Behavior During Therapy: WFL for tasks assessed/performed Overall Cognitive Status: Within Functional Limits for tasks assessed  General Comments: questionable STM deficits. pt repeats same questions during PT eval      General Comments      Exercises     Assessment/Plan    PT Assessment Patient needs continued PT services  PT Problem List Decreased strength;Decreased mobility;Decreased activity tolerance;Decreased balance;Decreased knowledge of use of DME       PT Treatment Interventions DME instruction;Therapeutic activities;Gait training;Functional mobility training;Therapeutic  exercise;Patient/family education    PT Goals (Current goals can be found in the Care Plan section)  Acute Rehab PT Goals Patient Stated Goal: home PT Goal Formulation: With patient Time For Goal Achievement: 10/18/20 Potential to Achieve Goals: Good    Frequency Min 3X/week   Barriers to discharge        Co-evaluation               AM-PAC PT "6 Clicks" Mobility  Outcome Measure Help needed turning from your back to your side while in a flat bed without using bedrails?: A Little Help needed moving from lying on your back to sitting on the side of a flat bed without using bedrails?: A Little Help needed moving to and from a bed to a chair (including a wheelchair)?: A Little Help needed standing up from a chair using your arms (e.g., wheelchair or bedside chair)?: A Little Help needed to walk in hospital room?: A Little Help needed climbing 3-5 steps with a railing? : A Lot 6 Click Score: 17    End of Session Equipment Utilized During Treatment:  (pt refuses gait belt) Activity Tolerance: Patient tolerated treatment well Patient left: with call bell/phone within reach;with chair alarm set;in chair Nurse Communication: Mobility status PT Visit Diagnosis: Unsteadiness on feet (R26.81);Other abnormalities of gait and mobility (R26.89)    Time: 5993-5701 PT Time Calculation (min) (ACUTE ONLY): 20 min   Charges:   PT Evaluation $PT Eval Low Complexity: [place], PT  Acute Rehab Dept ([place]) [phone] Pager [phone]  10/04/2020   [facility] 10/04/2020, 12:09 PM

## 2020-10-04 NOTE — Progress Notes (Signed)
PROGRESS NOTE    [name]   IDP:[id number]  DOB: 06/29/1929  PCP: Colon Branch, MD    DOA: 10/03/2020 LOS: 1   Brief Narrative   [name] is a 87 y.o. female with medical history significant for cardiomyopathy, mild obstructive nonobstructive coronary artery disease, DVT, CVA, L2 fracture with history of vertebroplasty with IR, thoracic compression fracture hypertension, hyperlipidemia, OSA, breast cancer, who presents with concern for lower back and abdominal pain in addition to urinary frequency without dysuria, and worsening generalized weakness limited her ability to ambulate with her walker.  At baseline pt is fully independent.  Lives alone.  Evaluation revealed probable UTI with UA showing large leukocytes, 21-50 wbc's and bacteruria.  She was admitted to [facility] service and started on empiric Rocephin pending urine culture results.  PT consulted for evaluation.    Assessment & Plan   Active Problems:   Essential hypertension   UTI (urinary tract infection)   Generalized weakness   Urinary tract infection -continue empiric Rocephin.  Follow-up urine cultures.  Continue very gentle maintenance fluids, on LR at 50/h.  Generalized weakness -patient lives alone and fully independent at baseline.  Suspect weakness due to infection.  Patient with difficulty ambulating prior to admission.  PT evaluation pending.  Indigestion/heartburn -Daily Pepcid as needed, Tums as needed.  Monitor.  Chronic problem per patient.  Chronic low back pain -patient had reported some increased low back pain in addition to abdominal pain on admission.  No CVA tenderness.  No signs of pyelonephritis on CT.  PT evaluation.  Conservative pain management.  Essential hypertension -continue home metoprolol.  Monitor BP.  Constipation -suspect was contributing to her lower abdominal pain, in addition to UTI.  Continue stool softeners.   Patient BMI: Body mass index is 18.24 kg/m.    DVT prophylaxis: enoxaparin (LOVENOX) injection 40 mg Start: 10/03/20 2330   Diet:  Diet Orders (From admission, onward)    Start     Ordered   10/03/20 2316  Diet Heart Room service appropriate? Yes; Fluid consistency: Thin  Diet effective now       Question Answer Comment  Room service appropriate? Yes   Fluid consistency: Thin      10/03/20 2315            Code Status: Full Code    Subjective 10/04/20    Patient seen up in recliner today.  She is reporting severe indigestion and asking for Tums.  She denies any other acute complaints including fevers, chills, nausea, vomiting, chest pain or shortness of breath.   Disposition Plan & Communication   Status is: Inpatient  Remains inpatient appropriate because:IV treatments appropriate due to intensity of illness or inability to take PO   Dispo: The patient is from: Home              Anticipated d/c is to: Home              Patient currently is not medically stable to d/c.   Difficult to place patient No   Consults, Procedures, Significant Events   Consultants:   None  Procedures:   None  Antimicrobials:  Anti-infectives (From admission, onward)   Start     Dose/Rate Route Frequency Ordered Stop   10/04/20 2000  cefTRIAXone (ROCEPHIN) 1 g in sodium chloride 0.9 % 100 mL IVPB        1 g 200 mL/hr over 30 Minutes Intravenous Every 24 hours 10/03/20 2315  10/03/20 1945  cefTRIAXone (ROCEPHIN) 1 g in sodium chloride 0.9 % 100 mL IVPB        1 g 200 mL/hr over 30 Minutes Intravenous  Once 10/03/20 1943 10/03/20 2041        Micro    Objective   Vitals:   10/03/20 2345 10/04/20 0157 10/04/20 0157 10/04/20 0640  BP: (!) 158/74 (!) 146/70 (!) 146/70 134/60  Pulse: 79 70 70 60  Resp:   16 16  Temp:  98.7 F (37.1 C) 98.7 F (37.1 C) 97.9 F (36.6 C)  TempSrc:  Oral Oral Oral  SpO2:  97% 97% 93%  Weight:      Height:        Intake/Output Summary (Last 24 hours) at 10/04/2020 0732 Last data  filed at 10/04/2020 0641 Gross per 24 hour  Intake 243.87 ml  Output 650 ml  Net -406.13 ml   Filed Weights   10/03/20 1424  Weight: 51.3 kg    Physical Exam:  General exam: awake, alert, no acute distress HEENT: moist mucus membranes, hard of hearing  Respiratory system: normal respiratory effort, on room air. Cardiovascular system: RRR, no pedal edema.   Gastrointestinal system: soft, NT, ND Central nervous system: A&O x3. no gross focal neurologic deficits, normal speech Skin: dry, intact, normal temperature, normal color, No rashes, lesions or ulcers seen on visualized skin Psychiatry: normal mood, congruent affect, judgement and insight appear normal  Labs   Data Reviewed: I have personally reviewed following labs and imaging studies  CBC: Recent Labs  Lab 10/03/20 1646 10/04/20 0019  WBC 5.1 4.0  NEUTROABS 4.2  --   HGB 13.4 12.8  HCT 41.3 39.7  MCV 105.9* 106.7*  PLT 311 497   Basic Metabolic Panel: Recent Labs  Lab 10/03/20 1646 10/04/20 0019  NA 135 141  K 4.3 3.6  CL 97* 104  CO2 31 34*  GLUCOSE 120* 104*  BUN 13 8  CREATININE 0.72 0.62  CALCIUM 9.5 9.3   GFR: Estimated Creatinine Clearance: 38.6 mL/min (by C-G formula based on SCr of 0.62 mg/dL). Liver Function Tests: Recent Labs  Lab 10/03/20 1646  AST 17  ALT 12  ALKPHOS 63  BILITOT 1.5*  PROT 6.6  ALBUMIN 3.6   Recent Labs  Lab 10/03/20 1646  LIPASE 25   No results for input(s): AMMONIA in the last 168 hours. Coagulation Profile: No results for input(s): INR, PROTIME in the last 168 hours. Cardiac Enzymes: No results for input(s): CKTOTAL, CKMB, CKMBINDEX, TROPONINI in the last 168 hours. BNP (last 3 results) No results for input(s): PROBNP in the last 8760 hours. HbA1C: No results for input(s): HGBA1C in the last 72 hours. CBG: No results for input(s): GLUCAP in the last 168 hours. Lipid Profile: No results for input(s): CHOL, HDL, LDLCALC, TRIG, CHOLHDL, LDLDIRECT in the  last 72 hours. Thyroid Function Tests: No results for input(s): TSH, T4TOTAL, FREET4, T3FREE, THYROIDAB in the last 72 hours. Anemia Panel: No results for input(s): VITAMINB12, FOLATE, FERRITIN, TIBC, IRON, RETICCTPCT in the last 72 hours. Sepsis Labs: Recent Labs  Lab 10/03/20 1955 10/04/20 0019  LATICACIDVEN 0.7 0.7    Recent Results (from the past 240 hour(s))  SARS CORONAVIRUS 2 (TAT 6-24 HRS) Nasopharyngeal Nasopharyngeal Swab     Status: None   Collection Time: 10/03/20  8:06 PM   Specimen: Nasopharyngeal Swab  Result Value Ref Range Status   SARS Coronavirus 2 NEGATIVE NEGATIVE Final    Comment: (NOTE) SARS-CoV-2 target  nucleic acids are NOT DETECTED.  The SARS-CoV-2 RNA is generally detectable in upper and lower respiratory specimens during the acute phase of infection. Negative results do not preclude SARS-CoV-2 infection, do not rule out co-infections with other pathogens, and should not be used as the sole basis for treatment or other patient management decisions. Negative results must be combined with clinical observations, patient history, and epidemiological information. The expected result is Negative.  Fact Sheet for Patients: SugarRoll.be  Fact Sheet for Healthcare Providers: https://www.woods-mathews.com/  This test is not yet approved or cleared by the [place] FDA and  has been authorized for detection and/or diagnosis of SARS-CoV-2 by FDA under an Emergency Use Authorization (EUA). This EUA will remain  in effect (meaning this test can be used) for the duration of the COVID-19 declaration under Se ction 564(b)(1) of the Act, 21 U.S.C. section 360bbb-3(b)(1), unless the authorization is terminated or revoked sooner.  Performed at [facility], [place] [address] [place], [place] [address]       Imaging Studies   CT ABDOMEN PELVIS WO CONTRAST  Result Date: 10/03/2020 CLINICAL DATA:  Left lower  quadrant pain, low back tenderness, with a new compression fracture. EXAM: CT ABDOMEN AND PELVIS WITHOUT CONTRAST TECHNIQUE: Multidetector CT imaging of the abdomen and pelvis was performed following the standard protocol without IV contrast. COMPARISON:  CT spine 07/04/2020, CT pelvis 11/04/2018, CT angiography 09/13/2019 FINDINGS: Lower chest: Bandlike areas of opacity in the lung bases likely reflecting atelectasis and/or scarring. Consolidative opacity. No sizable effusion. Cardiomegaly. Coronary artery calcifications. Hiatal hernia, as below. Hepatobiliary: Scattered hepatic parenchymal calcifications. Smooth liver surface contour. No concerning focal liver lesion. Normal hepatic attenuation. Prior cholecystectomy. No visible intraductal gallstones or significant biliary ductal dilatation accounting for post cholecystectomy reservoir effect and senescent change. Pancreas: No pancreatic ductal dilatation or surrounding inflammatory changes. Spleen: Normal in size. No concerning splenic lesions. Scattered punctate splenic granulomata. Small accessory spleen inferior to the splenic tip. Adrenals/Urinary Tract: Normal adrenal glands. Kidneys are symmetric in size and normally located. Bilateral extrarenal pelves. No urolithiasis or hydronephrosis. Visible or contour deforming renal lesions. Urinary bladder the mildly thickened accounting for the degree of distention. Stomach/Bowel: Large hiatal hernia, as above. Distal stomach and duodenum are unremarkable. No small bowel thickening or dilatation. The appendix is surgically absent. Moderate colonic stool burden. Scattered distal colonic predominant diverticula without noted as of acute diverticulitis at this time. No colonic dilatation or wall thickening. Vascular/Lymphatic: Atherosclerotic calcification throughout the abdominal aorta and branch vessels. No discernible abdominopelvic adenopathy. Reproductive: Fibroid uterus including a coarsely calcified left lower  uterine segment fibroid. Extensive myometrial calcifications are unchanged from comparison. No worrisome adnexal masses or lesions. Other: Mild circumferential body wall edema. More focal postsurgical stranding superficial to the left hip. No free abdominopelvic fluid or air. Some anterior abdominal wall laxity is unchanged. Musculoskeletal: The osseous structures appear diffusely demineralized which may limit detection of small or nondisplaced fractures. By in the spine are better detailed on dedicated lumbar reconstructions. Degenerative changes in hips pelvis. Prior left femoral intramedullary nail placement with transcervical fixation an. Apposes traumatic deformity the from prior intertrochanteric left femur fracture. Resolution of the previously seen left piriformis intramuscular hemorrhage. No acute or concerning muscular abnormality is seen. IMPRESSION: Colonic diverticulosis without evidence of acute diverticulitis. Moderate colonic stool burden, correlate for features of slow transit/constipation. Moderate to large hiatal hernia. Evidence of prior granulomatous disease in the in the abdomen. Aortic Atherosclerosis (ICD10-I70.0). Coronary artery atherosclerosis. Prior left  femoral intramedullary nail placement and transcervical fixation pin. Remote posttraumatic deformity left inter trochanteric femur fracture. Findings in the spine are better detailed on dedicated CT reconstructions generated and dictated separately. Electronically Signed   By: Lovena Le M.D.   On: 10/03/2020 19:03   CT Head Wo Contrast  Result Date: 10/03/2020 CLINICAL DATA:  Headache, new or worsening, positional (Age 47-49y) EXAM: CT HEAD WITHOUT CONTRAST TECHNIQUE: Contiguous axial images were obtained from the base of the skull through the vertex without intravenous contrast. COMPARISON:  Head CT 02/19/2020 FINDINGS: Brain: Stable degree of atrophy. Periventricular and deep white matter hypodensity consistent with chronic small  vessel ischemia, significantly changed. Small right frontal encephalomalacia again seen. Remote lacunar infarct in the right basal ganglia again seen. No hemorrhage. No evidence of acute ischemia or infarct. No midline shift or hydrocephalus. Vascular: Skull base atherosclerosis.  Dolichoectasia. Skull: Prior left occipital fracture partially healed. No acute findings. Sinuses/Orbits: Paranasal sinuses and mastoid air cells are clear. The visualized orbits are unremarkable. Other: None. IMPRESSION: 1. No acute intracranial abnormality. 2. Stable atrophy, chronic small vessel ischemia, and remote infarcts. Electronically Signed   By: Keith Rake M.D.   On: 10/03/2020 19:05   CT L-SPINE NO CHARGE  Result Date: 10/03/2020 CLINICAL DATA:  Bilateral lower back history of prior compression fractures and augmentation EXAM: CT LUMBAR SPINE WITHOUT CONTRAST TECHNIQUE: Multidetector CT imaging of the lumbar spine was performed without intravenous contrast administration. Multiplanar CT image reconstructions were also generated. COMPARISON:  Contemporary CT abdomen and pelvis., CT L-spine 07/04/2020 FINDINGS: Segmentation: 5 normally formed lumbar type vertebral levels. Multiple prior reports with completing numbering schema. In keeping with most recent numbering convention from CT imaging 07/04/2020, a vertebral augmentation is seen at L2 with the lowest fully formed disc space denoted as L5-S1. Alignment: Very mild levocurvature at the thoracolumbar junction. Mild grade 1 anterolisthesis L5 on S1 is unchanged. No new significant spondylolisthesis or spondylolysis. Vertebrae: Prior vertebral body augmentation at L2 without new vertebral body height loss. Some chronic L3, L4 height loss is unchanged from comparison. Question some degrees T11 inferior endplate though this is a large the characterized on this examination. The osseous structures appear diffusely demineralized which may limit detection of small or  nondisplaced fractures. Paraspinal and other soft tissues: No paraspinal fluid, swelling, gas or hemorrhage. No visible canal abnormality is seen within limitations of this unenhanced CT exam. For findings in the posterior abdomen pelvis, see dedicated contemporary CT of the abdomen pelvis from which this study is reconstructed. Disc levels:  Level by level evaluation of the lumbar spine below: T12-L1: Disc height loss, no significant posterior disc abnormality. No significant spinal canal or foraminal stenosis. L1-L2: Disc height loss. L2 vertebral augmentation. Bilateral facet arthropathy. Global disc bulge. Minimal canal stenosis and bilateral foraminal narrowing. L2-L3: L2 compression deformity post augmentation with retropulsion of the superior endplate by approximately 6 mm. Disc height loss. Mild global disc bulge. Bilateral facet arthropathy. Mild to moderate canal stenosis. Moderate bilateral foraminal narrowing. Disc height loss vacuum disc and global disc bulge facet arthropathy the mild canal stenosis and moderate bilateral foraminal narrowing. L3-L4: Disc height loss, global disc bulge and vacuum disc phenomenon moderate facet arthropathy. Mild canal stenosis bilateral foraminal narrowing. L4-L5: Disc height loss, shallow global disc bulge vacuum disc. Facet arthropathy. No significant canal stenosis. Mild bilateral foraminal narrowing. L5-S1: Grade 1 anterolisthesis. Global disc bulge. Bilateral facet arthropathy. No significant resulting canal stenosis. Mild bilateral foraminal narrowing. IMPRESSION: 1. Questionable deformity  of the inferior endplate T11 though this is on the margins of imaging and incompletely visualized on this exam. Correlate for focal tenderness and there is clinical concern completion imaging could be obtained. 2. Unchanged appearance of a remote deformity and augmentation at L2. Chronic height loss L3 and L4 is also stable. 3. Multilevel discogenic and facet degenerative  changes, maximal at the L2-L3 level exacerbated by inferior endplate retropulsion of L2. Described level by level above. 4. The osseous structures appear diffusely demineralized which may limit detection of small or nondisplaced fractures. 5. Dedicated CT of the abdomen and pelvis was performed and dictated separately. Electronically Signed   By: Lovena Le M.D.   On: 10/03/2020 19:22     Medications   Scheduled Meds: . aspirin EC  81 mg Oral Daily  . enoxaparin (LOVENOX) injection  40 mg Subcutaneous Q24H  . metoprolol tartrate  50 mg Oral BID   Continuous Infusions: . cefTRIAXone (ROCEPHIN)  IV    . lactated ringers 50 mL/hr at 10/03/20 2330       LOS: 1 day    Time spent: 30 minutes    Ezekiel Slocumb, DO [facility]  10/04/2020, 7:32 AM      If 7PM-7AM, please contact night-coverage. How to contact the [facility] Attending or Consulting provider [place] or covering provider during after hours [place], for this patient?    1. Check the care team in [facility] and look for a) attending/consulting [facility] provider listed and b) the [facility] team listed 2. Log into [email or web address] and use [place]'s universal password to access. If you do not have the password, please contact the hospital operator. 3. Locate the [facility] provider you are looking for under [facility] and page to a number that you can be directly reached. 4. If you still have difficulty reaching the provider, please page the [facility] (Director on Call) for the Hospitalists listed on amion for assistance.

## 2020-10-04 NOTE — Hospital Course (Signed)
[name] is a 87 y.o. female with medical history significant for cardiomyopathy, mild obstructive nonobstructive coronary artery disease, DVT, CVA, L2 fracture with history of vertebroplasty with IR, thoracic compression fracture hypertension, hyperlipidemia, OSA, breast cancer, who presents with concern for lower back and abdominal pain in addition to urinary frequency without dysuria, and worsening generalized weakness limited her ability to ambulate with her walker.  At baseline pt is fully independent.  Lives alone.  Evaluation revealed probable UTI with UA showing large leukocytes, 21-50 wbc's and bacteruria.  She was admitted to [facility] service and started on empiric Rocephin pending urine culture results.  PT consulted for evaluation.

## 2020-10-05 ENCOUNTER — Inpatient Hospital Stay (HOSPITAL_COMMUNITY): Payer: Medicare Other

## 2020-10-05 DIAGNOSIS — R627 Adult failure to thrive: Secondary | ICD-10-CM | POA: Diagnosis not present

## 2020-10-05 DIAGNOSIS — N390 Urinary tract infection, site not specified: Secondary | ICD-10-CM | POA: Diagnosis not present

## 2020-10-05 LAB — BASIC METABOLIC PANEL
Anion gap: 7 (ref 5–15)
BUN: 6 mg/dL — ABNORMAL LOW (ref 8–23)
CO2: 32 mmol/L (ref 22–32)
Calcium: 9.2 mg/dL (ref 8.9–10.3)
Chloride: 96 mmol/L — ABNORMAL LOW (ref 98–111)
Creatinine, Ser: 0.43 mg/dL — ABNORMAL LOW (ref 0.44–1.00)
GFR, Estimated: >60 mL/min (ref >60)
Glucose, Bld: 116 mg/dL — ABNORMAL HIGH (ref 70–99)
Potassium: 3.5 mmol/L (ref 3.5–5.1)
Sodium: 135 mmol/L (ref 135–145)

## 2020-10-05 MED ORDER — ENSURE ENLIVE PO LIQD
237.0000 mL | Freq: Two times a day (BID) | ORAL | Status: DC
  Administered 2020-10-05 – 2020-10-06 (×2): 237 mL via ORAL

## 2020-10-05 MED ORDER — POLYETHYLENE GLYCOL 3350 17 G PO PACK
17.0000 g | PACK | Freq: Every day | ORAL | Status: DC
  Administered 2020-10-06: 17 g via ORAL
  Filled 2020-10-05: qty 1

## 2020-10-05 MED ORDER — SENNOSIDES-DOCUSATE SODIUM 8.6-50 MG PO TABS
1.0000 | ORAL_TABLET | Freq: Two times a day (BID) | ORAL | Status: DC
  Administered 2020-10-05 – 2020-10-06 (×3): 1 via ORAL
  Filled 2020-10-05 (×3): qty 1

## 2020-10-05 MED ORDER — HYDROCODONE-ACETAMINOPHEN 5-325 MG PO TABS: 1.0000 | ORAL_TABLET | Freq: Four times a day (QID) | ORAL | Status: DC | PRN

## 2020-10-05 MED ORDER — TRAMADOL HCL 50 MG PO TABS
50.0000 mg | ORAL_TABLET | Freq: Four times a day (QID) | ORAL | Status: DC | PRN
  Administered 2020-10-05 – 2020-10-06 (×3): 50 mg via ORAL
  Filled 2020-10-05 (×3): qty 1

## 2020-10-05 MED ORDER — LIDOCAINE 5 % EX PTCH
1.0000 | MEDICATED_PATCH | CUTANEOUS | Status: DC
  Administered 2020-10-05: 1 via TRANSDERMAL
  Filled 2020-10-05 (×2): qty 1

## 2020-10-05 NOTE — TOC Initial Note (Signed)
Transition of Care [facility]) - Initial/Assessment Note   Patient Details  Name: [name] MRN: [id number] Date of Birth: Mar 02, 1930  Transition of Care [facility]) CM/SW Contact:    Sherie Don, LCSW Phone Number: 10/05/2020, 10:29 AM  Clinical Narrative: Patient is an 87 year old female who was admitted for UTI. PT evaluation recommended HHPT. CSW met with patient to discuss recommendations. Patient is agreeable to HHPT, but did not have a preference. CSW requested permission to speak with daughter, [name], to see if family has a preference. CSW called daughter regarding [facility] agencies and to assess for DME needs. Per daughter, the family does not have a preference for [facility] agencies. Patient has a rolling walker and cane at home, although she does not use these consistently. Patient referred to [facility] for [place]. TOC to follow.  Expected Discharge Plan: [place] Barriers to Discharge: Continued Medical Work up  Patient Goals and CMS Choice Patient states their goals for this hospitalization and ongoing recovery are:: Discharge home with [place] CMS Medicare.gov Compare Post Acute Care list provided to:: Patient Choice offered to / list presented to : [place]  Expected Discharge Plan and Services Expected Discharge Plan: [place] In-house Referral: Clinical Social Work Post Acute Care Choice: [place] arrangements for the past 2 months: Single Family Home             DME Arranged: N/A DME Agency: NA HH Arranged: PT [place] Agency: [facility] Date [place]: 10/05/20 Representative spoke with at [place]: Georgina Snell  Prior Living Arrangements/Services Living arrangements for the past 2 months: Single Family Home Lives with:: Self Patient language and need for interpreter reviewed:: Yes Do you feel safe going back to the place where you live?: Yes      Need for Family Participation in Patient Care: Yes  (Comment) Care giver support system in place?: Yes (comment) Current home services: DME (Rolling walker, cane) Criminal Activity/Legal Involvement Pertinent to Current Situation/Hospitalization: No - Comment as needed  Activities of Daily Living Home Assistive Devices/Equipment: Eyeglasses,Cane (specify quad or straight),Walker (specify type) ADL Screening (condition at time of admission) Patient's cognitive ability adequate to safely complete daily activities?: Yes Is the patient deaf or have difficulty hearing?: Yes Does the patient have difficulty seeing, even when wearing glasses/contacts?: No Does the patient have difficulty concentrating, remembering, or making decisions?: No Patient able to express need for assistance with ADLs?: Yes Does the patient have difficulty dressing or bathing?: No Independently performs ADLs?: Yes (appropriate for developmental age) Does the patient have difficulty walking or climbing stairs?: Yes Weakness of Legs: None Weakness of Arms/Hands: None  Permission Sought/Granted Permission sought to share information with : Other (comment) Permission granted to share information with : Yes, Verbal Permission Granted Permission granted to share info w AGENCY: [place] agencies  Emotional Assessment Appearance:: Appears stated age Attitude/Demeanor/Rapport: Engaged Affect (typically observed): Accepting Orientation: : Oriented to Self,Oriented to Place,Oriented to  Time,Oriented to Situation Alcohol / Substance Use: Not Applicable Psych Involvement: No (comment)  Admission diagnosis:  UTI (urinary tract infection) [N39.0] Lumbar back pain [M54.50] Patient Active Problem List   Diagnosis Date Noted  . UTI (urinary tract infection) 10/03/2020  . Generalized weakness 10/03/2020  . Otalgia of both ears   . Hypertension   . GERD (gastroesophageal reflux disease)   . Dyspnea   . DVT (deep venous thrombosis) ([place])   . Diverticulitis   . Cancer (  [place])   . CAD  (coronary artery disease)   . Acute midline thoracic back pain 03/04/2020  . Malnutrition of moderate degree 12/27/2019  . Pressure injury of skin 12/27/2019  . S/p left hip fracture   . Comminuted fracture of hip, left, open type I or II, initial encounter ([place]) 12/24/2019  . Closed left hip fracture ([place]) 12/24/2019  . ICH (intracerebral hemorrhage) ([place]) 11/04/2019  . Intractable back pain 09/13/2019  . Compression fracture of T9 vertebra ([place]) 09/13/2019  . Chronic diastolic CHF (congestive heart failure) ([place]) 09/13/2019  . Overweight (BMI 25.0-29.9) 09/13/2019  . Constipation 09/13/2019  . Raynaud's phenomenon without gangrene 05/19/2018  . Iron deficiency anemia due to chronic blood loss 12/30/2017  . Insomnia 06/25/2017  . Mild CAD 05/24/2017  . PCP NOTES >>> 02/15/2015  . Superficial thrombophlebitis 04/06/2014  . Annual physical exam 08/18/2012  . Edema 04/14/2012  . Breast cancer ([place]) 11/05/2011  . Polycythemia 11/05/2011  . Stroke ([place]) 03/29/2011  . OSA (obstructive sleep apnea) 03/29/2011  . OA (osteoarthritis) of knee 03/29/2011  . Hiatal hernia 03/29/2011  . E. coli gastroenteritis 03/29/2011  . Depression with anxiety 03/29/2011  . Chest pain 01/16/2011  . TIA (transient ischemic attack) 01/16/2011  . CARDIOMYOPATHY 08/03/2009  . DJD -- pain mgmt 08/16/2008  . ABDOMINAL PAIN, LEFT LOWER QUADRANT 07/05/2008  . GAIT DISTURBANCE 10/21/2007  . ANXIETY DEPRESSION 10/08/2007  . Fracture 09/10/2007  . Hyperlipidemia 06/25/2007  . Takotsubo cardiomyopathy 06/25/2007  . GERD without esophagitis 06/25/2007  . Essential hypertension 09/30/2006  . Senile osteoporosis 09/30/2006  . Migraine with aura 09/30/2006  . Hyperplastic colon polyp 06/2001   PCP:  Colon Branch, MD Pharmacy:   [facility] [phone] [address], [address] AT [facility] [address] [place] [address] Phone: [phone] Fax:  [phone]  Readmission Risk Interventions Readmission Risk Prevention Plan 09/14/2019  Medication Screening Complete  Transportation Screening Complete  Some recent data might be hidden

## 2020-10-05 NOTE — Progress Notes (Signed)
Physical Therapy Treatment Patient Details Name: [name] MRN: [id number] DOB: 23-Jun-1929 Today's Date: 10/05/2020    History of Present Illness 87 y.o. female with medical history significant for cardiomyopathy, mild obstructive nonobstructive coronary artery disease, DVT, CVA, L2 fracture with history of vertebroplasty with IR, thoracic compression fracture hypertension, hyperlipidemia, OSA, breast cancer, who presents with concern for lower back and abdominal pain. She reports she has had chronic low back pain and hip pain for years. Reports increased pain in low back for past 5 days. admitted with UTI    PT Comments    Pt very willing to attempt to mobilize however unable to stand today d/t L hip pain.  Pt reports this happens at home sometimes and she is unable to get out of bed. Concerned about d/c plan, discussed with pt and she is adamantly refusing SNF.  Will continue to follow in acute setting. Dr. Erlinda Hong aware.   Follow Up Recommendations  Home health PT     Equipment Recommendations  None recommended by PT    Recommendations for Other Services       Precautions / Restrictions Precautions Precautions: Fall Restrictions Weight Bearing Restrictions: No    Mobility  Bed Mobility Overal bed mobility: Needs Assistance Bed Mobility: Supine to Sit;Sit to Supine     Supine to sit: Min guard Sit to supine: Min guard   General bed mobility comments: min guard, incr time.    Transfers Overall transfer level: Needs assistance               General transfer comment: attempted however pt unable d/t pain  Ambulation/Gait                 Stairs             Wheelchair Mobility    Modified Rankin (Stroke Patients Only)       Balance                                            Cognition Arousal/Alertness: Awake/alert Behavior During Therapy: WFL for tasks assessed/performed Overall Cognitive Status: Within  Functional Limits for tasks assessed                                 General Comments: appears WFL, previous session pt with some STM issues      Exercises      General Comments        Pertinent Vitals/Pain Pain Assessment: 0-10 Faces Pain Scale: Hurts whole lot Pain Location: L hip Pain Descriptors / Indicators: Aching;Grimacing Pain Intervention(s): Limited activity within patient's tolerance;Monitored during session;Premedicated before session;Repositioned    Home Living                      Prior Function            PT Goals (current goals can now be found in the care plan section) Acute Rehab PT Goals Patient Stated Goal: home PT Goal Formulation: With patient Time For Goal Achievement: 10/18/20 Potential to Achieve Goals: Good Progress towards PT goals: Progressing toward goals (slowly)    Frequency    Min 3X/week      PT Plan Current plan remains appropriate    Co-evaluation  AM-PAC PT "6 Clicks" Mobility   Outcome Measure  Help needed turning from your back to your side while in a flat bed without using bedrails?: A Little Help needed moving from lying on your back to sitting on the side of a flat bed without using bedrails?: A Little Help needed moving to and from a bed to a chair (including a wheelchair)?: Total Help needed standing up from a chair using your arms (e.g., wheelchair or bedside chair)?: Total Help needed to walk in hospital room?: Total Help needed climbing 3-5 steps with a railing? : Total 6 Click Score: 10    End of Session   Activity Tolerance: Patient tolerated treatment well Patient left: in bed;with call bell/phone within reach;with bed alarm set   PT Visit Diagnosis: Unsteadiness on feet (R26.81);Other abnormalities of gait and mobility (R26.89)     Time: 1314-3888 PT Time Calculation (min) (ACUTE ONLY): 15 min  Charges:  $Therapeutic Activity: 8-22 mins                      Baxter Flattery, PT  Acute Rehab Dept ([place]) [phone] Pager [phone]  10/05/2020    [facility] 10/05/2020, 1:08 PM

## 2020-10-05 NOTE — Progress Notes (Signed)
PROGRESS NOTE    [name]  WTU:[id number] DOB: 1929-08-18 DOA: 10/03/2020 PCP: Colon Branch, MD    Chief Complaint  Patient presents with  . Back Pain  . Hip Pain    Brief Narrative:   Chief complaints: low back pain and lower abdominal pain, urinary frequency, weakness [name] is a 87 y.o. female with medical history significant for cardiomyopathy, mild obstructive nonobstructive coronary artery disease, DVT, CVA, L2 fracture with history of vertebroplasty with IR, thoracic compression fracture hypertension, hyperlipidemia, OSA, breast cancer, who presents with concern for lower back and abdominal pain. She reports she has had chronic low back pain and hip pain for years. Reports increased pain in low back for past 5 days. No injury or trauma to back or abdomen. She reports history of falls in past but none recently. She states she was bent over making her bed when she developed increase pain in back 5 days ago. She reports having urinary frequency  Found to have UTI  Subjective:  She is hard of hearing, but aaox3, she c/o left hip pain, bilateral low back pain, which is chronic for a year,  She appear weak RN reports although patient is aaox3, she sometimes has visual hallucination, seeing ants on her blanket  Assessment & Plan:   Active Problems:   Essential hypertension   UTI (urinary tract infection)   Generalized weakness   E. coli UTI -Blood culture no growth, urine positive greater than 100,000 colonies of E. Coli, susceptibility pending -Continue Rocephin  Visual hallucination Per RN report CT of the head on admission no acute findings, did show prior strokes Will DC Norco, change to Ultram as needed for pain control, add lidocaine patch  Constipation : CT ab pel showed "moderate colonic stool burden, correlate for features of slow transit/constipation." Start Senokot, miralax  Chronic back pain Pt has chronic low back pain that is not  different for her she states. She has no CVA tenderness to palpation on exam. No signs of pyelonephritis on CT scan done in ER.   Left hip pain Hip x ray no acute findings, does show  "Post LEFT femoral ORIF and old LEFT inferior pubic ramus fracture.  Marked osseous demineralization. No acute abnormalities." Will check vit d level, noted she is on fosamax  HTN; stable on Lopressor/ASA   history of CVAs Per report she is allergic to statin and Zetia She is on aspirin only  Polycythemia  Followed by Dr. Marin Olp phlebotomy to maintain hematocrit below 45% Hydrea 500 mg by mouth every other day -- re-start on 07/16/2019 IV Iron as needed --   FTT: she reports lives by herself, currently appears very weak to me, she declined SNF placement, daughter is aware, daughter will try to set up more care at home, I donot think it is safe for her to drive with weakness and hard of hearing, daughter is made aware and agrees.  Underweight: Body mass index is 18.24 kg/m.Marland Kitchen Seen by dietician.  I agree with the assessment and plan as outlined below: Nutrition Status: Nutrition Problem: Increased nutrient needs Etiology: acute illness Signs/Symptoms: estimated needs Interventions: Ensure Enlive (each supplement provides 350kcal and 20 grams of protein)  .     Unresulted Labs (From admission, onward)          Start     Ordered   10/10/20 0500  Creatinine, serum  (enoxaparin (LOVENOX)    CrCl >/= 30 ml/min)  Weekly,   R  Comments: while on enoxaparin therapy    10/03/20 2315   10/06/20 0500  VITAMIN D 25 Hydroxy (Vit-D Deficiency, Fractures)  Tomorrow morning,   R        10/05/20 1623   10/06/20 0500  Vitamin B12  Tomorrow morning,   R        10/05/20 1707            DVT prophylaxis: enoxaparin (LOVENOX) injection 40 mg Start: 10/03/20 2330   Code Status: full  Family Communication: per daughter she is a DNR, will verify with patient Disposition:   Status is:  Inpatient  Dispo: The patient is from: home              Anticipated d/c is to: declined SNF, will arrange home health              Anticipated d/c date is: 24-48hrs pending urine culture result                Consultants:   none  Procedures:   None   Antimicrobials:   Anti-infectives (From admission, onward)   Start     Dose/Rate Route Frequency Ordered Stop   10/04/20 2000  cefTRIAXone (ROCEPHIN) 1 g in sodium chloride 0.9 % 100 mL IVPB        1 g 200 mL/hr over 30 Minutes Intravenous Every 24 hours 10/03/20 2315     10/03/20 1945  cefTRIAXone (ROCEPHIN) 1 g in sodium chloride 0.9 % 100 mL IVPB        1 g 200 mL/hr over 30 Minutes Intravenous  Once 10/03/20 1943 10/03/20 2041         Objective: Vitals:   10/04/20 1352 10/04/20 2234 10/05/20 0502 10/05/20 1330  BP: (!) 173/75 (!) 151/71 (!) 162/83 (!) 165/74  Pulse: (!) 51 68 62 61  Resp: 14 17 16 16   Temp: 98.4 F (36.9 C) 98 F (36.7 C) 98.9 F (37.2 C) 98.4 F (36.9 C)  TempSrc:  Oral Oral Oral  SpO2: 100% 99% 96% 99%  Weight:      Height:        Intake/Output Summary (Last 24 hours) at 10/05/2020 1725 Last data filed at 10/05/2020 1700 Gross per 24 hour  Intake 2493.37 ml  Output 3225 ml  Net -731.63 ml   Filed Weights   10/03/20 1424  Weight: 51.3 kg    Examination:  General exam: very weak and frail. Hard of hearing but calm, NAD, aaox3 Respiratory system: Clear to auscultation. Respiratory effort normal. Cardiovascular system: S1 & S2 heard, RRR. No JVD, no murmur, No pedal edema. Gastrointestinal system: Abdomen is nondistended, soft and nontender. Normal bowel sounds heard. Central nervous system: Alert and oriented. No focal neurological deficits. Extremities: generalized weakness, no focal deficits Skin: No rashes, lesions or ulcers Psychiatry: Judgement and insight appear normal. Mood & affect appropriate.     Data Reviewed: I have personally reviewed following labs and imaging  studies  CBC: Recent Labs  Lab 10/03/20 1646 10/04/20 0019  WBC 5.1 4.0  NEUTROABS 4.2  --   HGB 13.4 12.8  HCT 41.3 39.7  MCV 105.9* 106.7*  PLT 311 803    Basic Metabolic Panel: Recent Labs  Lab 10/03/20 1646 10/04/20 0019 10/05/20 0321  NA 135 141 135  K 4.3 3.6 3.5  CL 97* 104 96*  CO2 31 34* 32  GLUCOSE 120* 104* 116*  BUN 13 8 6*  CREATININE 0.72 0.62 0.43*  CALCIUM 9.5 9.3  9.2    GFR: Estimated Creatinine Clearance: 38.6 mL/min (A) (by C-G formula based on SCr of 0.43 mg/dL (L)).  Liver Function Tests: Recent Labs  Lab 10/03/20 1646  AST 17  ALT 12  ALKPHOS 63  BILITOT 1.5*  PROT 6.6  ALBUMIN 3.6    CBG: No results for input(s): GLUCAP in the last 168 hours.   Recent Results (from the past 240 hour(s))  Urine culture     Status: Abnormal (Preliminary result)   Collection Time: 10/03/20  4:46 PM   Specimen: Urine, Random  Result Value Ref Range Status   Specimen Description   Final    URINE, RANDOM Performed at [facility] [address], [place], [place] [address]    Special Requests   Final    NONE Performed at [facility], [place] [address] [place], [place] [address]    Culture (A)  Final    >=100,000 COLONIES/mL ESCHERICHIA COLI SUSCEPTIBILITIES TO FOLLOW Performed at [facility], [place] [address] [place], [place] [address]    Report Status PENDING  Incomplete  Blood culture (routine x 2)     Status: None (Preliminary result)   Collection Time: 10/03/20  8:00 PM   Specimen: BLOOD LEFT ARM  Result Value Ref Range Status   Specimen Description   Final    BLOOD LEFT ARM Performed at [facility] [address], [place], [place] [address]    Special Requests   Final    BOTTLES DRAWN AEROBIC ONLY Blood Culture adequate volume Performed at [facility] [address], [place], [place] [address]    Culture   Final    NO GROWTH 1 DAY Performed at  [facility], [place] [address] [place], [place] [address]    Report Status PENDING  Incomplete  Blood culture (routine x 2)     Status: None (Preliminary result)   Collection Time: 10/03/20  8:00 PM   Specimen: BLOOD  Result Value Ref Range Status   Specimen Description   Final    BLOOD RIGHT ANTECUBITAL Performed at [facility] [address], [place], [place] [address]    Special Requests   Final    BOTTLES DRAWN AEROBIC AND ANAEROBIC Blood Culture adequate volume Performed at [facility] [address], [place], [place] [address]    Culture   Final    NO GROWTH 1 DAY Performed at [facility], [place] [address] [place], [place] [address]    Report Status PENDING  Incomplete  SARS CORONAVIRUS 2 (TAT 6-24 HRS) Nasopharyngeal Nasopharyngeal Swab     Status: None   Collection Time: 10/03/20  8:06 PM   Specimen: Nasopharyngeal Swab  Result Value Ref Range Status   SARS Coronavirus 2 NEGATIVE NEGATIVE Final    Comment: (NOTE) SARS-CoV-2 target nucleic acids are NOT DETECTED.  The SARS-CoV-2 RNA is generally detectable in upper and lower respiratory specimens during the acute phase of infection. Negative results do not preclude SARS-CoV-2 infection, do not rule out co-infections with other pathogens, and should not be used as the sole basis for treatment or other patient management decisions. Negative results must be combined with clinical observations, patient history, and epidemiological information. The expected result is Negative.  Fact Sheet for Patients: SugarRoll.be  Fact Sheet for Healthcare Providers: https://www.woods-mathews.com/  This test is not yet approved or cleared by the [place] FDA and  has been authorized for  detection and/or diagnosis of SARS-CoV-2 by FDA under an Emergency Use Authorization (EUA). This EUA will remain  in effect (meaning this test can be  used) for the duration of the COVID-19 declaration under Se ction 564(b)(1) of the Act, 21 U.S.C. section 360bbb-3(b)(1), unless the authorization is terminated or revoked sooner.  Performed at [facility], [place] [address], [place], [place] [address]          Radiology Studies: CT ABDOMEN PELVIS WO CONTRAST  Result Date: 10/03/2020 CLINICAL DATA:  Left lower quadrant pain, low back tenderness, with a new compression fracture. EXAM: CT ABDOMEN AND PELVIS WITHOUT CONTRAST TECHNIQUE: Multidetector CT imaging of the abdomen and pelvis was performed following the standard protocol without IV contrast. COMPARISON:  CT spine 07/04/2020, CT pelvis 11/04/2018, CT angiography 09/13/2019 FINDINGS: Lower chest: Bandlike areas of opacity in the lung bases likely reflecting atelectasis and/or scarring. Consolidative opacity. No sizable effusion. Cardiomegaly. Coronary artery calcifications. Hiatal hernia, as below. Hepatobiliary: Scattered hepatic parenchymal calcifications. Smooth liver surface contour. No concerning focal liver lesion. Normal hepatic attenuation. Prior cholecystectomy. No visible intraductal gallstones or significant biliary ductal dilatation accounting for post cholecystectomy reservoir effect and senescent change. Pancreas: No pancreatic ductal dilatation or surrounding inflammatory changes. Spleen: Normal in size. No concerning splenic lesions. Scattered punctate splenic granulomata. Small accessory spleen inferior to the splenic tip. Adrenals/Urinary Tract: Normal adrenal glands. Kidneys are symmetric in size and normally located. Bilateral extrarenal pelves. No urolithiasis or hydronephrosis. Visible or contour deforming renal lesions. Urinary bladder the mildly thickened accounting for the degree of distention. Stomach/Bowel: Large hiatal hernia, as above. Distal stomach and duodenum are unremarkable. No small bowel thickening or dilatation. The appendix is surgically absent.  Moderate colonic stool burden. Scattered distal colonic predominant diverticula without noted as of acute diverticulitis at this time. No colonic dilatation or wall thickening. Vascular/Lymphatic: Atherosclerotic calcification throughout the abdominal aorta and branch vessels. No discernible abdominopelvic adenopathy. Reproductive: Fibroid uterus including a coarsely calcified left lower uterine segment fibroid. Extensive myometrial calcifications are unchanged from comparison. No worrisome adnexal masses or lesions. Other: Mild circumferential body wall edema. More focal postsurgical stranding superficial to the left hip. No free abdominopelvic fluid or air. Some anterior abdominal wall laxity is unchanged. Musculoskeletal: The osseous structures appear diffusely demineralized which may limit detection of small or nondisplaced fractures. By in the spine are better detailed on dedicated lumbar reconstructions. Degenerative changes in hips pelvis. Prior left femoral intramedullary nail placement with transcervical fixation an. Apposes traumatic deformity the from prior intertrochanteric left femur fracture. Resolution of the previously seen left piriformis intramuscular hemorrhage. No acute or concerning muscular abnormality is seen. IMPRESSION: Colonic diverticulosis without evidence of acute diverticulitis. Moderate colonic stool burden, correlate for features of slow transit/constipation. Moderate to large hiatal hernia. Evidence of prior granulomatous disease in the in the abdomen. Aortic Atherosclerosis (ICD10-I70.0). Coronary artery atherosclerosis. Prior left femoral intramedullary nail placement and transcervical fixation pin. Remote posttraumatic deformity left inter trochanteric femur fracture. Findings in the spine are better detailed on dedicated CT reconstructions generated and dictated separately. Electronically Signed   By: Lovena Le M.D.   On: 10/03/2020 19:03   CT Head Wo Contrast  Result  Date: 10/03/2020 CLINICAL DATA:  Headache, new or worsening, positional (Age 37-49y) EXAM: CT HEAD WITHOUT CONTRAST TECHNIQUE: Contiguous axial images were obtained from the base of the skull through the vertex without intravenous contrast. COMPARISON:  Head CT 02/19/2020 FINDINGS: Brain: Stable degree of atrophy. Periventricular and deep white matter  hypodensity consistent with chronic small vessel ischemia, significantly changed. Small right frontal encephalomalacia again seen. Remote lacunar infarct in the right basal ganglia again seen. No hemorrhage. No evidence of acute ischemia or infarct. No midline shift or hydrocephalus. Vascular: Skull base atherosclerosis.  Dolichoectasia. Skull: Prior left occipital fracture partially healed. No acute findings. Sinuses/Orbits: Paranasal sinuses and mastoid air cells are clear. The visualized orbits are unremarkable. Other: None. IMPRESSION: 1. No acute intracranial abnormality. 2. Stable atrophy, chronic small vessel ischemia, and remote infarcts. Electronically Signed   By: Keith Rake M.D.   On: 10/03/2020 19:05   CT L-SPINE NO CHARGE  Result Date: 10/03/2020 CLINICAL DATA:  Bilateral lower back history of prior compression fractures and augmentation EXAM: CT LUMBAR SPINE WITHOUT CONTRAST TECHNIQUE: Multidetector CT imaging of the lumbar spine was performed without intravenous contrast administration. Multiplanar CT image reconstructions were also generated. COMPARISON:  Contemporary CT abdomen and pelvis., CT L-spine 07/04/2020 FINDINGS: Segmentation: 5 normally formed lumbar type vertebral levels. Multiple prior reports with completing numbering schema. In keeping with most recent numbering convention from CT imaging 07/04/2020, a vertebral augmentation is seen at L2 with the lowest fully formed disc space denoted as L5-S1. Alignment: Very mild levocurvature at the thoracolumbar junction. Mild grade 1 anterolisthesis L5 on S1 is unchanged. No new  significant spondylolisthesis or spondylolysis. Vertebrae: Prior vertebral body augmentation at L2 without new vertebral body height loss. Some chronic L3, L4 height loss is unchanged from comparison. Question some degrees T11 inferior endplate though this is a large the characterized on this examination. The osseous structures appear diffusely demineralized which may limit detection of small or nondisplaced fractures. Paraspinal and other soft tissues: No paraspinal fluid, swelling, gas or hemorrhage. No visible canal abnormality is seen within limitations of this unenhanced CT exam. For findings in the posterior abdomen pelvis, see dedicated contemporary CT of the abdomen pelvis from which this study is reconstructed. Disc levels:  Level by level evaluation of the lumbar spine below: T12-L1: Disc height loss, no significant posterior disc abnormality. No significant spinal canal or foraminal stenosis. L1-L2: Disc height loss. L2 vertebral augmentation. Bilateral facet arthropathy. Global disc bulge. Minimal canal stenosis and bilateral foraminal narrowing. L2-L3: L2 compression deformity post augmentation with retropulsion of the superior endplate by approximately 6 mm. Disc height loss. Mild global disc bulge. Bilateral facet arthropathy. Mild to moderate canal stenosis. Moderate bilateral foraminal narrowing. Disc height loss vacuum disc and global disc bulge facet arthropathy the mild canal stenosis and moderate bilateral foraminal narrowing. L3-L4: Disc height loss, global disc bulge and vacuum disc phenomenon moderate facet arthropathy. Mild canal stenosis bilateral foraminal narrowing. L4-L5: Disc height loss, shallow global disc bulge vacuum disc. Facet arthropathy. No significant canal stenosis. Mild bilateral foraminal narrowing. L5-S1: Grade 1 anterolisthesis. Global disc bulge. Bilateral facet arthropathy. No significant resulting canal stenosis. Mild bilateral foraminal narrowing. IMPRESSION: 1.  Questionable deformity of the inferior endplate T11 though this is on the margins of imaging and incompletely visualized on this exam. Correlate for focal tenderness and there is clinical concern completion imaging could be obtained. 2. Unchanged appearance of a remote deformity and augmentation at L2. Chronic height loss L3 and L4 is also stable. 3. Multilevel discogenic and facet degenerative changes, maximal at the L2-L3 level exacerbated by inferior endplate retropulsion of L2. Described level by level above. 4. The osseous structures appear diffusely demineralized which may limit detection of small or nondisplaced fractures. 5. Dedicated CT of the abdomen and pelvis was performed and  dictated separately. Electronically Signed   By: Lovena Le M.D.   On: 10/03/2020 19:22   DG HIP UNILAT WITH PELVIS 2-3 VIEWS LEFT  Result Date: 10/05/2020 CLINICAL DATA:  Chronic low back and LEFT hip pain EXAM: DG HIP (WITH OR WITHOUT PELVIS) 2-3V LEFT COMPARISON:  12/25/2019 FINDINGS: Osseous demineralization. IM nail with compression screw and distal locking screw at proximal LEFT femur post ORIF of intertrochanteric fracture. Old healed fracture LEFT inferior pubic ramus. No acute fracture, dislocation, or bone destruction. Degenerative changes at visualized lumbar spine. IMPRESSION: Post LEFT femoral ORIF and old LEFT inferior pubic ramus fracture. Marked osseous demineralization. No acute abnormalities. Electronically Signed   By: Lavonia Dana M.D.   On: 10/05/2020 15:09        Scheduled Meds: . aspirin EC  81 mg Oral Daily  . enoxaparin (LOVENOX) injection  40 mg Subcutaneous Q24H  . feeding supplement  237 mL Oral BID BM  . lidocaine  1 patch Transdermal Q24H  . metoprolol tartrate  50 mg Oral BID  . [START ON 10/06/2020] polyethylene glycol  17 g Oral Daily  . senna-docusate  1 tablet Oral BID   Continuous Infusions: . cefTRIAXone (ROCEPHIN)  IV 1 g (10/04/20 2009)  . lactated ringers 50 mL/hr at  10/04/20 2109     LOS: 2 days   Time spent: 65mins Greater than 50% of this time was spent in counseling, explanation of diagnosis, planning of further management, and coordination of care.   Voice Recognition Viviann Spare dictation system was used to create this note, attempts have been made to correct errors. Please contact the author with questions and/or clarifications.   Florencia Reasons, MD PhD FACP [facility]  Available via Epic secure chat 7am-7pm for nonurgent issues Please page for urgent issues To page the attending provider between 7A-7P or the covering provider during after hours 7P-7A, please log into the web site [email or web address] and access using universal [place] password for that web site. If you do not have the password, please call the hospital operator.    10/05/2020, 5:25 PM

## 2020-10-05 NOTE — Progress Notes (Signed)
Initial Nutrition Assessment  DOCUMENTATION CODES:   Underweight  INTERVENTION:  - will order Ensure Enlive BID, each supplement provides 350 kcal and 20 grams of protein. - complete NFPE when feasible.    NUTRITION DIAGNOSIS:   Increased nutrient needs related to acute illness as evidenced by estimated needs.  GOAL:   Patient will meet greater than or equal to 90% of their needs  MONITOR:   PO intake,Supplement acceptance,Labs,Weight trends  REASON FOR ASSESSMENT:   Malnutrition Screening Tool  ASSESSMENT:   87 y.o. female with medical history of cardiomyopathy, mild obstructive non-obstructive CAD, DVT, CVA, L2 fracture with hx of vertebroplasty with IR, thoracic compression fracture, HTN, HLD, OSA, and breast cancer. She presented to the ED due to lower back and abdominal pain, urinary frequency without dysuria, and worsening generalized weakness limiting ambulation. She lives alone and is fully independent at baseline.  Unable to see patient at the time of visit. Flow sheet documentation indicates she ate 30% of breakfast and 20% of lunch yesterday and 50% of breakfast this AM.   Weight on 5/23 was 113 lb and weight on 4/27 was 116 lb. This indicates 3 lb weight loss (2.6% body weight) in the past 1 month; not significant for time frame.  Per notes:  - UTI - chronic low back pain with no indication of pyelonephritis on CT - constipation   Labs reviewed; Cl: 96 mmol/l, BUN: 6 mg/dl, creatinine: 0.43 mg/dl.  Medications reviewed; 1 tablet senokot BID.  IVF; LR @ 50 ml/hr.     NUTRITION - FOCUSED PHYSICAL EXAM:  unable to complete at this time.   Diet Order:   Diet Order            Diet Heart Room service appropriate? Yes; Fluid consistency: Thin  Diet effective now                 EDUCATION NEEDS:   No education needs have been identified at this time  Skin:  Skin Assessment: Reviewed RN Assessment  Last BM:  5/25 (type x1)  Height:   Ht  Readings from Last 1 Encounters:  10/03/20 5\' 6"  (1.676 m)    Weight:   Wt Readings from Last 1 Encounters:  10/03/20 51.3 kg     Estimated Nutritional Needs:  Kcal:  1350-1550 kcal Protein:  60-70 grams Fluid:  >/= 1.6 L/day      Jarome Matin, MS, RD, LDN, CNSC Inpatient Clinical Dietitian RD pager # available in AMION  After hours/weekend pager # available in [facility]

## 2020-10-06 DIAGNOSIS — R531 Weakness: Secondary | ICD-10-CM | POA: Diagnosis not present

## 2020-10-06 DIAGNOSIS — N390 Urinary tract infection, site not specified: Secondary | ICD-10-CM | POA: Diagnosis not present

## 2020-10-06 DIAGNOSIS — D751 Secondary polycythemia: Secondary | ICD-10-CM

## 2020-10-06 LAB — URINE CULTURE: Culture: >=100000 — AB

## 2020-10-06 LAB — VITAMIN D 25 HYDROXY (VIT D DEFICIENCY, FRACTURES): Vit D, 25-Hydroxy: 30.35 ng/mL (ref 30–100)

## 2020-10-06 LAB — VITAMIN B12: Vitamin B-12: 881 pg/mL (ref 180–914)

## 2020-10-06 MED ORDER — SENNOSIDES-DOCUSATE SODIUM 8.6-50 MG PO TABS: 1.0000 | ORAL_TABLET | Freq: Two times a day (BID) | ORAL | Status: DC

## 2020-10-06 MED ORDER — TRAMADOL HCL 50 MG PO TABS: 50.0000 mg | ORAL_TABLET | Freq: Four times a day (QID) | ORAL | 0 refills | Status: DC | PRN

## 2020-10-06 MED ORDER — CEPHALEXIN 500 MG PO CAPS: 500.0000 mg | ORAL_CAPSULE | Freq: Three times a day (TID) | ORAL | Status: DC

## 2020-10-06 MED ORDER — HYDROXYUREA 500 MG PO CAPS
500.0000 mg | ORAL_CAPSULE | ORAL | Status: DC
  Filled 2020-10-06: qty 1

## 2020-10-06 MED ORDER — CEPHALEXIN 500 MG PO CAPS: 500.0000 mg | ORAL_CAPSULE | Freq: Three times a day (TID) | ORAL | 0 refills | Status: AC

## 2020-10-06 NOTE — Progress Notes (Signed)
Physical Therapy Treatment Patient Details Name: [name] MRN: [id number] DOB: 04/14/1930 Today's Date: 10/06/2020    History of Present Illness 87 y.o. female with medical history significant for cardiomyopathy, mild obstructive nonobstructive coronary artery disease, DVT, CVA, L2 fracture with history of vertebroplasty with IR, thoracic compression fracture hypertension, hyperlipidemia, OSA, breast cancer, who presents with concern for lower back and abdominal pain. She reports she has had chronic low back pain and hip pain for years. Reports increased pain in low back for past 5 days. admitted with UTI    PT Comments    Pt making gradual progress but still needs min A for transfers and min guard with slow limited gait.  Pt expressed concern about ability to return home, but refuses SNF and also "fired" the help she had at home (increased time spent educating on short term SNF role today, but still refused).  She does have decreased safety awareness but seems to be baseline (refusing gait belt, checked self out of ALF, let go the help she had at home).  Continue to advance as able while hospitalized.    Follow Up Recommendations  Other (comment) (REcommend SNF but pt adamently refuses.  If home needs HHPT and supervision.)     Equipment Recommendations  None recommended by PT    Recommendations for Other Services       Precautions / Restrictions Precautions Precautions: Fall    Mobility  Bed Mobility Overal bed mobility: Needs Assistance Bed Mobility: Rolling;Sidelying to Sit;Sit to Supine Rolling: Min guard Sidelying to sit: Min guard   Sit to supine: Min assist   General bed mobility comments: Required increased time and cues for log roll due to back pain.  Min A for legs back to bed    Transfers Overall transfer level: Needs assistance Equipment used: Rolling walker (2 wheeled) Transfers: Sit to/from Stand Sit to Stand: Min guard         General  transfer comment: increased time to rise  Ambulation/Gait Ambulation/Gait assistance: Min guard Gait Distance (Feet): 25 Feet Assistive device: Rolling walker (2 wheeled) Gait Pattern/deviations: Step-to pattern;Decreased stride length;Shuffle Gait velocity: decreased   General Gait Details: slow ambulation, min guard for safety, limited by pain   Stairs             Wheelchair Mobility    Modified Rankin (Stroke Patients Only)       Balance Overall balance assessment: Needs assistance Sitting-balance support: No upper extremity supported Sitting balance-Leahy Scale: Good     Standing balance support: Bilateral upper extremity supported Standing balance-Leahy Scale: Poor Standing balance comment: required RW                            Cognition Arousal/Alertness: Awake/alert Behavior During Therapy: WFL for tasks assessed/performed Overall Cognitive Status: Within Functional Limits for tasks assessed                                 General Comments: Does have some decreased safety awareness (refusing SNF, refusing gait belt because "it hurts my pride" educated on why we use gait belt to prevent falls and she again stated that she did not want it)      Exercises      General Comments General comments (skin integrity, edema, etc.): Pt states "I'm not sure why they are letting me go , I can't walk."  Discussed  medical stability.  Again approached SNF with pt but she refused - states wants to be home and doesn't want to pay "$6,000 per month, my money is important in case I need anything."  Discussed that insurance typically covers short term rehab - pt acknowledged but still wanted to return home.  Asked about assist at home - reports has a daughter but she can't help all of the time.  Reports had some help 3 days a week but she let them go a couple weeks ago. Pt gave permission to speak with daughter.  Daughter reports they had pt in ALF but  she checked herself out, they had set up help 3 days a week but pt let them go 2 weeks ago.  She reports she is not able to assist at all times but she has contacted the previous agency and trying to get more help back out for her mother as pt is refusing SNF.      Pertinent Vitals/Pain Pain Assessment: 0-10 Pain Score: 7  Pain Location: Mid  R lateral back (chronic but exacerbated) Pain Descriptors / Indicators: Aching;Grimacing Pain Intervention(s): Limited activity within patient's tolerance;Monitored during session;Repositioned;Relaxation    Home Living                      Prior Function            PT Goals (current goals can now be found in the care plan section) Acute Rehab PT Goals Patient Stated Goal: home PT Goal Formulation: With patient Time For Goal Achievement: 10/18/20 Potential to Achieve Goals: Fair Progress towards PT goals: Progressing toward goals    Frequency    Min 3X/week      PT Plan Discharge plan needs to be updated    Co-evaluation              AM-PAC PT "6 Clicks" Mobility   Outcome Measure  Help needed turning from your back to your side while in a flat bed without using bedrails?: A Little Help needed moving from lying on your back to sitting on the side of a flat bed without using bedrails?: A Little Help needed moving to and from a bed to a chair (including a wheelchair)?: A Little Help needed standing up from a chair using your arms (e.g., wheelchair or bedside chair)?: A Little Help needed to walk in hospital room?: A Little Help needed climbing 3-5 steps with a railing? : A Little 6 Click Score: 18    End of Session Equipment Utilized During Treatment:  (Refuses gait belt) Activity Tolerance: Patient limited by pain Patient left: in bed;with call bell/phone within reach;with bed alarm set Nurse Communication: Mobility status PT Visit Diagnosis: Unsteadiness on feet (R26.81);Other abnormalities of gait and mobility  (R26.89)     Time: 1240-1310 PT Time Calculation (min) (ACUTE ONLY): 30 min  Charges:  $Gait Training: 8-22 mins $Therapeutic Activity: 8-22 mins                     Abran Richard, PT Acute Rehab Services Pager [phone] [facility] [place] 10/06/2020, 2:49 PM

## 2020-10-06 NOTE — Care Management Important Message (Signed)
Important Message  Patient Details IM Letter given to the Patient. Name: [name] MRN: [id number] Date of Birth: [birth year]   Medicare Important Message Given:  Yes     Kerin Salen 10/06/2020, 12:27 PM

## 2020-10-06 NOTE — TOC Transition Note (Signed)
Transition of Care [facility]) - CM/SW Discharge Note  Patient Details  Name: Keora Eccleston MRN: [id number] Date of Birth: 1929-08-12  [facility] CM/SW Contact:  Sherie Don, LCSW Phone Number: 10/06/2020, 11:55 AM  Clinical Narrative: Patient set up with [facility] through [facility]. Orders have been placed. CSW updated Georgina Snell with [facility]. TOC signing off.  Final next level of care: [place] Barriers to Discharge: Barriers Resolved  Patient Goals and CMS Choice Patient states their goals for this hospitalization and ongoing recovery are:: Discharge home with [place] CMS Medicare.gov Compare Post Acute Care list provided to:: Patient Choice offered to / list presented to : [place]  Discharge Plan and Services In-house Referral: Clinical Social Work Post Acute Care Choice: Home Health          DME Arranged: N/A DME Agency: NA HH Arranged: PT,Nurse's Aide [place] Agency: [place] Date [facility] Agency Contacted: 10/05/20 Representative spoke with at [place]: Georgina Snell  Readmission Risk Interventions Readmission Risk Prevention Plan 09/14/2019  Medication Screening Complete  Transportation Screening Complete  Some recent data might be hidden

## 2020-10-06 NOTE — Discharge Summary (Signed)
Discharge Summary  [name] DGU:[id number] DOB: 12/25/1929  PCP: Colon Branch, MD  Admit date: 10/03/2020 Discharge date: 10/06/2020  Time spent: 58mins  Recommendations for Outpatient Follow-up:  1. F/u with PCP within a week  for hospital discharge follow up, repeat cbc/bmp at follow up 2. Patient declined skilled nursing facility placement ,Home health arranged    Discharge Diagnoses:  Active Hospital Problems   Diagnosis Date Noted  . UTI (urinary tract infection) 10/03/2020  . Generalized weakness 10/03/2020  . Essential hypertension 09/30/2006    Resolved Hospital Problems  No resolved problems to display.    Discharge Condition: stable  Diet recommendation: heart healthy  Filed Weights   10/03/20 1424  Weight: 51.3 kg    History of present illness: (Per admitting MD Dr. Tonie Griffith) Chief Complaint: low back pain and lower abdominal pain, urinary frequency, weakness  HPI: [name] is a 87 y.o. female with medical history significant for cardiomyopathy, mild obstructive nonobstructive coronary artery disease, DVT, CVA, L2 fracture with history of vertebroplasty with IR, thoracic compression fracture hypertension, hyperlipidemia, OSA, breast cancer, who presents with concern for lower back and abdominal pain. She reports she has had chronic low back pain and hip pain for years. Reports increased pain in low back for past 5 days. No injury or trauma to back or abdomen. She reports history of falls in past but none recently. She states she was bent over making her bed when she developed increase pain in back 5 days ago. She reports having urinary frequency but no dysuria later that night. Has continued to have frequency. She reports having generalized weakness and having limited ambulation with her walker the past few days. Normally is very independent and can do ADLs.  No numbness of extremities. No fever. Has mild lower abdominal pain that does not  radiate. She has no nausea or vomiting. Denies cough, SOB, chest pain, fever, chills.  She lives alone with her cat. No tobacco, alcohol use.   ED Course: [name] is found to have leukocytes in urine with negative nitrite. WBC is normal at 5100.  Electrolytes and renal function are normal on labs.  CT of abdomen pelvis shows diverticulosis with no diverticulitis with some constipation.  Kidneys are normal with no signs of stranding or pyelonephritis.  Patient's been hemodynamically stable in the emergency room   Hospital Course:  Active Problems:   Essential hypertension   UTI (urinary tract infection)   Generalized weakness   E. coli UTI -Blood culture no growth, urine positive greater than 100,000 colonies of E. Coli,  resistant to Cipro sensitive to rest antibiotic tested -She is treated with Rocephin in the hospital , discharged on Keflex   Visual hallucination Per RN report CT of the head on admission no acute findings, did show prior strokes Will DC Norco, change to Ultram as needed for pain control, add lidocaine patch Appear improved  Constipation : CT ab pel showed "moderate colonic stool burden, correlate for features of slow transit/constipation." Start Senokot, miralax  Chronic back pain Pt has chronic low back pain that is not different for her she states. She has no CVA tenderness to palpation on exam. No signs of pyelonephritis on CT scan done in ER.   Left hip pain Hip x ray no acute findings, does show  "Post LEFT femoral ORIF and old LEFT inferior pubic ramus fracture.  Marked osseous demineralization. No acute abnormalities."  vit d level 30.35  noted she is on fosamax  HTN; stable on Lopressor/ASA   history of CVAs Per report she is allergic to statin and Zetia She is on aspirin only  Polycythemia  Followed by Dr. Marin Olp phlebotomy to maintain hematocrit below 45% Hydrea 500 mg by mouth every other day-- re-starton03/08/2019 IV  Iron as needed   FTT: she reports lives by herself, currently appears very weak to me, she declined SNF placement, daughter is aware, daughter will try to set up more care at home, I donot think it is safe for her to drive with weakness and hard of hearing, daughter is made aware and agrees. Home health arranged  Underweight: Body mass index is 18.24 kg/m.Marland Kitchen Seen by dietician.  I agree with the assessment and plan as outlined below: Nutrition Status: Nutrition Problem: Increased nutrient needs Etiology: acute illness Signs/Symptoms: estimated needs Interventions: Ensure Enlive (each supplement provides 350kcal and 20 grams of protein)  .Code Status:  DNR, verified with patient and daughter Family Communication:  daughter over the phone on May 25 Disposition: Home with home health, she declined SNF       Procedures:  None  Consultations:  None  ABX:  Anti-infectives (From admission, onward)   Start     Dose/Rate Route Frequency Ordered Stop   10/06/20 1600  cephALEXin (KEFLEX) capsule 500 mg        500 mg Oral 3 times daily 10/06/20 0912 10/10/20 0959   10/06/20 0000  cephALEXin (KEFLEX) 500 MG capsule        500 mg Oral 3 times daily 10/06/20 1136 10/10/20 2359   10/04/20 2000  cefTRIAXone (ROCEPHIN) 1 g in sodium chloride 0.9 % 100 mL IVPB  Status:  Discontinued        1 g 200 mL/hr over 30 Minutes Intravenous Every 24 hours 10/03/20 2315 10/06/20 0912   10/03/20 1945  cefTRIAXone (ROCEPHIN) 1 g in sodium chloride 0.9 % 100 mL IVPB        1 g 200 mL/hr over 30 Minutes Intravenous  Once 10/03/20 1943 10/03/20 2041       Discharge Exam: BP (!) 169/91   Pulse 68   Temp 98 F (36.7 C) (Oral)   Resp 16   Ht 5\' 6"  (1.676 m)   Wt 51.3 kg   SpO2 96%   BMI 18.24 kg/m   General: weak and frail, NAD, aaox3, hard of hearing Cardiovascular: RRR Respiratory: Normal respiratory effort  Discharge Instructions You were cared for by a hospitalist during your  hospital stay. If you have any questions about your discharge medications or the care you received while you were in the hospital after you are discharged, you can call the unit and asked to speak with the hospitalist on call if the hospitalist that took care of you is not available. Once you are discharged, your primary care physician will handle any further medical issues. Please note that NO REFILLS for any discharge medications will be authorized once you are discharged, as it is imperative that you return to your primary care physician (or establish a relationship with a primary care physician if you do not have one) for your aftercare needs so that they can reassess your need for medications and monitor your lab values.  Discharge Instructions    Diet - low sodium heart healthy   Complete by: As directed    Increase activity slowly   Complete by: As directed      Allergies as of 10/06/2020      Reactions   Penicillins  Hives, Rash, Other (See Comments)   Has patient had a PCN reaction causing immediate rash, facial/tongue/throat swelling, SOB or lightheadedness with hypotension: Yes Has patient had a PCN reaction causing severe rash involving mucus membranes or skin necrosis: yes Has patient had a PCN reaction that required hospitalization: No Has patient had a PCN reaction occurring within the last 10 years: no If all of the above answers are "NO", then may proceed with Cephalosporin use. Other Reaction: OTHER REACTION   Valsartan Itching, Other (See Comments)   Atorvastatin Diarrhea   Carvedilol Other (See Comments)   "teeth hurt when I took it"   Ezetimibe Nausea Only   Fluvastatin Sodium Other (See Comments)   "Can't remember"   Magnesium Hydroxide Other (See Comments)   REACTION: triggers HAs   Meloxicam Other (See Comments)   Pt seeing auro's / spots.     Pneumovax [pneumococcal Polysaccharide Vaccine] Other (See Comments)   Local reaction   Quinapril Hcl Other (See Comments)     "feelings of tiredness"   Simvastatin Diarrhea   Topamax [topiramate] Itching   Vit D-vit E-safflower Oil Other (See Comments)   Headaches      Medication List    STOP taking these medications   bisacodyl 10 MG suppository Commonly known as: DULCOLAX   cyclobenzaprine 5 MG tablet Commonly known as: FLEXERIL   methylPREDNISolone 4 MG Tbpk tablet Commonly known as: MEDROL DOSEPAK   multivitamin with minerals Tabs tablet   ondansetron 4 MG tablet Commonly known as: ZOFRAN   valACYclovir 500 MG tablet Commonly known as: VALTREX     TAKE these medications   alendronate 70 MG tablet Commonly known as: FOSAMAX Take 1 tablet (70 mg total) by mouth every 7 (seven) days. Take with a full glass of water on an empty stomach.   aspirin 81 MG EC tablet Take 1 tablet (81 mg total) by mouth daily. Swallow whole.   bismuth subsalicylate 902 IO/97DZ suspension Commonly known as: PEPTO BISMOL Take 30 mLs by mouth every 4 (four) hours as needed for indigestion (heartburn).   cephALEXin 500 MG capsule Commonly known as: KEFLEX Take 1 capsule (500 mg total) by mouth 3 (three) times daily for 4 days.   furosemide 20 MG tablet Commonly known as: Lasix Take 1 tablet (20 mg total) by mouth daily. What changed:   when to take this  reasons to take this   hydroxyurea 500 MG capsule Commonly known as: HYDREA TAKE 1 CAPSULE BY MOUTH EVERY DAY. MAY TAKE WITH FOOD TO MINIMIZE GI SIDE EFFECTS What changed: See the new instructions.   lactose free nutrition Liqd Take 237 mLs by mouth daily.   Lidocaine 4 % Ptch LIDOCAINE 4% PATCH :Apply 1 patch topically daily and remove after 12 hours to left lateral chest What changed:   how much to take  how to take this  when to take this  additional instructions   LORazepam 0.5 MG tablet Commonly known as: ATIVAN TAKE 1/2 TABLET BY MOUTH EVERY MORNING AND EVERY AFTERNOON THEN TAKE 2 TABLETS BY MOUTH AT BEDTIME AS NEEDED What  changed: See the new instructions.   metoprolol tartrate 50 MG tablet Commonly known as: LOPRESSOR Take 1 tablet (50 mg total) by mouth 2 (two) times daily.   nitroGLYCERIN 0.4 MG SL tablet Commonly known as: NITROSTAT Place 1 tablet (0.4 mg total) under the tongue every 5 (five) minutes x 3 doses as needed for chest pain.   omeprazole 20 MG capsule Commonly known as:  PRILOSEC Take 1 capsule (20 mg total) by mouth daily.   polyethylene glycol 17 g packet Commonly known as: MIRALAX / GLYCOLAX Take 17 g by mouth daily.   senna-docusate 8.6-50 MG tablet Commonly known as: Senokot-S Take 1 tablet by mouth 2 (two) times daily.   traMADol 50 MG tablet Commonly known as: ULTRAM Take 1 tablet (50 mg total) by mouth every 6 (six) hours as needed for severe pain.      Allergies  Allergen Reactions  . Penicillins Hives, Rash and Other (See Comments)    Has patient had a PCN reaction causing immediate rash, facial/tongue/throat swelling, SOB or lightheadedness with hypotension: Yes Has patient had a PCN reaction causing severe rash involving mucus membranes or skin necrosis: yes Has patient had a PCN reaction that required hospitalization: No Has patient had a PCN reaction occurring within the last 10 years: no If all of the above answers are "NO", then may proceed with Cephalosporin use.  Other Reaction: OTHER REACTION  . Valsartan Itching and Other (See Comments)  . Atorvastatin Diarrhea  . Carvedilol Other (See Comments)    "teeth hurt when I took it"   . Ezetimibe Nausea Only  . Fluvastatin Sodium Other (See Comments)    "Can't remember"  . Magnesium Hydroxide Other (See Comments)    REACTION: triggers HAs  . Meloxicam Other (See Comments)    Pt seeing auro's / spots.    . Pneumovax [Pneumococcal Polysaccharide Vaccine] Other (See Comments)    Local reaction  . Quinapril Hcl Other (See Comments)     "feelings of tiredness"  . Simvastatin Diarrhea  . Topamax [Topiramate]  Itching  . Vit D-Vit E-Safflower Oil Other (See Comments)    Headaches    Follow-up Information    [facility] Follow up.   Specialty: Home Health Services Why: PT Contact information: [place] [address] [phone]        Colon Branch, MD Follow up in 1 week(s).   Specialty: Internal Medicine Why: hospital discharge follow up, repeat basic labs including cbc/bmp at follow up Contact information: [address] [place]  [address] [phone]                The results of significant diagnostics from this hospitalization (including imaging, microbiology, ancillary and laboratory) are listed below for reference.    Significant Diagnostic Studies: CT ABDOMEN PELVIS WO CONTRAST  Result Date: 10/03/2020 CLINICAL DATA:  Left lower quadrant pain, low back tenderness, with a new compression fracture. EXAM: CT ABDOMEN AND PELVIS WITHOUT CONTRAST TECHNIQUE: Multidetector CT imaging of the abdomen and pelvis was performed following the standard protocol without IV contrast. COMPARISON:  CT spine 07/04/2020, CT pelvis 11/04/2018, CT angiography 09/13/2019 FINDINGS: Lower chest: Bandlike areas of opacity in the lung bases likely reflecting atelectasis and/or scarring. Consolidative opacity. No sizable effusion. Cardiomegaly. Coronary artery calcifications. Hiatal hernia, as below. Hepatobiliary: Scattered hepatic parenchymal calcifications. Smooth liver surface contour. No concerning focal liver lesion. Normal hepatic attenuation. Prior cholecystectomy. No visible intraductal gallstones or significant biliary ductal dilatation accounting for post cholecystectomy reservoir effect and senescent change. Pancreas: No pancreatic ductal dilatation or surrounding inflammatory changes. Spleen: Normal in size. No concerning splenic lesions. Scattered punctate splenic granulomata. Small accessory spleen inferior to the splenic tip.  Adrenals/Urinary Tract: Normal adrenal glands. Kidneys are symmetric in size and normally located. Bilateral extrarenal pelves. No urolithiasis or hydronephrosis. Visible or contour deforming renal lesions. Urinary bladder  the mildly thickened accounting for the degree of distention. Stomach/Bowel: Large hiatal hernia, as above. Distal stomach and duodenum are unremarkable. No small bowel thickening or dilatation. The appendix is surgically absent. Moderate colonic stool burden. Scattered distal colonic predominant diverticula without noted as of acute diverticulitis at this time. No colonic dilatation or wall thickening. Vascular/Lymphatic: Atherosclerotic calcification throughout the abdominal aorta and branch vessels. No discernible abdominopelvic adenopathy. Reproductive: Fibroid uterus including a coarsely calcified left lower uterine segment fibroid. Extensive myometrial calcifications are unchanged from comparison. No worrisome adnexal masses or lesions. Other: Mild circumferential body wall edema. More focal postsurgical stranding superficial to the left hip. No free abdominopelvic fluid or air. Some anterior abdominal wall laxity is unchanged. Musculoskeletal: The osseous structures appear diffusely demineralized which may limit detection of small or nondisplaced fractures. By in the spine are better detailed on dedicated lumbar reconstructions. Degenerative changes in hips pelvis. Prior left femoral intramedullary nail placement with transcervical fixation an. Apposes traumatic deformity the from prior intertrochanteric left femur fracture. Resolution of the previously seen left piriformis intramuscular hemorrhage. No acute or concerning muscular abnormality is seen. IMPRESSION: Colonic diverticulosis without evidence of acute diverticulitis. Moderate colonic stool burden, correlate for features of slow transit/constipation. Moderate to large hiatal hernia. Evidence of prior granulomatous disease in the in  the abdomen. Aortic Atherosclerosis (ICD10-I70.0). Coronary artery atherosclerosis. Prior left femoral intramedullary nail placement and transcervical fixation pin. Remote posttraumatic deformity left inter trochanteric femur fracture. Findings in the spine are better detailed on dedicated CT reconstructions generated and dictated separately. Electronically Signed   By: Lovena Le M.D.   On: 10/03/2020 19:03   CT Head Wo Contrast  Result Date: 10/03/2020 CLINICAL DATA:  Headache, new or worsening, positional (Age 52-49y) EXAM: CT HEAD WITHOUT CONTRAST TECHNIQUE: Contiguous axial images were obtained from the base of the skull through the vertex without intravenous contrast. COMPARISON:  Head CT 02/19/2020 FINDINGS: Brain: Stable degree of atrophy. Periventricular and deep white matter hypodensity consistent with chronic small vessel ischemia, significantly changed. Small right frontal encephalomalacia again seen. Remote lacunar infarct in the right basal ganglia again seen. No hemorrhage. No evidence of acute ischemia or infarct. No midline shift or hydrocephalus. Vascular: Skull base atherosclerosis.  Dolichoectasia. Skull: Prior left occipital fracture partially healed. No acute findings. Sinuses/Orbits: Paranasal sinuses and mastoid air cells are clear. The visualized orbits are unremarkable. Other: None. IMPRESSION: 1. No acute intracranial abnormality. 2. Stable atrophy, chronic small vessel ischemia, and remote infarcts. Electronically Signed   By: Keith Rake M.D.   On: 10/03/2020 19:05   CT L-SPINE NO CHARGE  Result Date: 10/03/2020 CLINICAL DATA:  Bilateral lower back history of prior compression fractures and augmentation EXAM: CT LUMBAR SPINE WITHOUT CONTRAST TECHNIQUE: Multidetector CT imaging of the lumbar spine was performed without intravenous contrast administration. Multiplanar CT image reconstructions were also generated. COMPARISON:  Contemporary CT abdomen and pelvis., CT L-spine  07/04/2020 FINDINGS: Segmentation: 5 normally formed lumbar type vertebral levels. Multiple prior reports with completing numbering schema. In keeping with most recent numbering convention from CT imaging 07/04/2020, a vertebral augmentation is seen at L2 with the lowest fully formed disc space denoted as L5-S1. Alignment: Very mild levocurvature at the thoracolumbar junction. Mild grade 1 anterolisthesis L5 on S1 is unchanged. No new significant spondylolisthesis or spondylolysis. Vertebrae: Prior vertebral body augmentation at L2 without new vertebral body height loss. Some chronic L3, L4 height loss is unchanged from comparison. Question some degrees T11 inferior endplate though this is a  large the characterized on this examination. The osseous structures appear diffusely demineralized which may limit detection of small or nondisplaced fractures. Paraspinal and other soft tissues: No paraspinal fluid, swelling, gas or hemorrhage. No visible canal abnormality is seen within limitations of this unenhanced CT exam. For findings in the posterior abdomen pelvis, see dedicated contemporary CT of the abdomen pelvis from which this study is reconstructed. Disc levels:  Level by level evaluation of the lumbar spine below: T12-L1: Disc height loss, no significant posterior disc abnormality. No significant spinal canal or foraminal stenosis. L1-L2: Disc height loss. L2 vertebral augmentation. Bilateral facet arthropathy. Global disc bulge. Minimal canal stenosis and bilateral foraminal narrowing. L2-L3: L2 compression deformity post augmentation with retropulsion of the superior endplate by approximately 6 mm. Disc height loss. Mild global disc bulge. Bilateral facet arthropathy. Mild to moderate canal stenosis. Moderate bilateral foraminal narrowing. Disc height loss vacuum disc and global disc bulge facet arthropathy the mild canal stenosis and moderate bilateral foraminal narrowing. L3-L4: Disc height loss, global disc  bulge and vacuum disc phenomenon moderate facet arthropathy. Mild canal stenosis bilateral foraminal narrowing. L4-L5: Disc height loss, shallow global disc bulge vacuum disc. Facet arthropathy. No significant canal stenosis. Mild bilateral foraminal narrowing. L5-S1: Grade 1 anterolisthesis. Global disc bulge. Bilateral facet arthropathy. No significant resulting canal stenosis. Mild bilateral foraminal narrowing. IMPRESSION: 1. Questionable deformity of the inferior endplate T11 though this is on the margins of imaging and incompletely visualized on this exam. Correlate for focal tenderness and there is clinical concern completion imaging could be obtained. 2. Unchanged appearance of a remote deformity and augmentation at L2. Chronic height loss L3 and L4 is also stable. 3. Multilevel discogenic and facet degenerative changes, maximal at the L2-L3 level exacerbated by inferior endplate retropulsion of L2. Described level by level above. 4. The osseous structures appear diffusely demineralized which may limit detection of small or nondisplaced fractures. 5. Dedicated CT of the abdomen and pelvis was performed and dictated separately. Electronically Signed   By: Lovena Le M.D.   On: 10/03/2020 19:22   DG HIP UNILAT WITH PELVIS 2-3 VIEWS LEFT  Result Date: 10/05/2020 CLINICAL DATA:  Chronic low back and LEFT hip pain EXAM: DG HIP (WITH OR WITHOUT PELVIS) 2-3V LEFT COMPARISON:  12/25/2019 FINDINGS: Osseous demineralization. IM nail with compression screw and distal locking screw at proximal LEFT femur post ORIF of intertrochanteric fracture. Old healed fracture LEFT inferior pubic ramus. No acute fracture, dislocation, or bone destruction. Degenerative changes at visualized lumbar spine. IMPRESSION: Post LEFT femoral ORIF and old LEFT inferior pubic ramus fracture. Marked osseous demineralization. No acute abnormalities. Electronically Signed   By: Lavonia Dana M.D.   On: 10/05/2020 15:09     Microbiology: Recent Results (from the past 240 hour(s))  Urine culture     Status: Abnormal   Collection Time: 10/03/20  4:46 PM   Specimen: Urine, Random  Result Value Ref Range Status   Specimen Description   Final    URINE, RANDOM Performed at [facility] [address] [place], [place] [address]    Special Requests   Final    NONE Performed at [facility] [place] [address] [place], [place] [address]    Culture >=100,000 COLONIES/mL ESCHERICHIA COLI (A)  Final   Report Status 10/06/2020 FINAL  Final   Organism ID, Bacteria ESCHERICHIA COLI (A)  Final      Susceptibility   Escherichia coli - MIC*    AMPICILLIN <=2 SENSITIVE  Sensitive     CEFAZOLIN <=4 SENSITIVE Sensitive     CEFEPIME <=0.12 SENSITIVE Sensitive     CEFTRIAXONE <=0.25 SENSITIVE Sensitive     CIPROFLOXACIN >=4 RESISTANT Resistant     GENTAMICIN <=1 SENSITIVE Sensitive     IMIPENEM <=0.25 SENSITIVE Sensitive     NITROFURANTOIN <=16 SENSITIVE Sensitive     TRIMETH/SULFA <=20 SENSITIVE Sensitive     AMPICILLIN/SULBACTAM <=2 SENSITIVE Sensitive     PIP/TAZO <=4 SENSITIVE Sensitive     * >=100,000 COLONIES/mL ESCHERICHIA COLI  Blood culture (routine x 2)     Status: None (Preliminary result)   Collection Time: 10/03/20  8:00 PM   Specimen: BLOOD LEFT ARM  Result Value Ref Range Status   Specimen Description   Final    BLOOD LEFT ARM Performed at [facility] [address] [place], [place] [address]    Special Requests   Final    BOTTLES DRAWN AEROBIC ONLY Blood Culture adequate volume Performed at [facility] [address] [place], [place] [address]    Culture   Final    NO GROWTH 2 DAYS Performed at [facility] [address], [place], [place] [address]    Report Status PENDING  Incomplete  Blood culture (routine x 2)     Status: None (Preliminary result)   Collection Time: 10/03/20  8:00 PM   Specimen:  BLOOD  Result Value Ref Range Status   Specimen Description   Final    BLOOD RIGHT ANTECUBITAL Performed at [facility] [address], [place], [place] [address]    Special Requests   Final    BOTTLES DRAWN AEROBIC AND ANAEROBIC Blood Culture adequate volume Performed at [facility] [address], [place], [place] [address]    Culture   Final    NO GROWTH 2 DAYS Performed at [facility] [address], [place], [place] [address]    Report Status PENDING  Incomplete  SARS CORONAVIRUS 2 (TAT 6-24 HRS) Nasopharyngeal Nasopharyngeal Swab     Status: None   Collection Time: 10/03/20  8:06 PM   Specimen: Nasopharyngeal Swab  Result Value Ref Range Status   SARS Coronavirus 2 NEGATIVE NEGATIVE Final    Comment: (NOTE) SARS-CoV-2 target nucleic acids are NOT DETECTED.  The SARS-CoV-2 RNA is generally detectable in upper and lower respiratory specimens during the acute phase of infection. Negative results do not preclude SARS-CoV-2 infection, do not rule out co-infections with other pathogens, and should not be used as the sole basis for treatment or other patient management decisions. Negative results must be combined with clinical observations, patient history, and epidemiological information. The expected result is Negative.  Fact Sheet for Patients: SugarRoll.be  Fact Sheet for Healthcare Providers: https://www.woods-mathews.com/  This test is not yet approved or cleared by the [place] FDA and  has been authorized for detection and/or diagnosis of SARS-CoV-2 by FDA under an Emergency Use Authorization (EUA). This EUA will remain  in effect (meaning this test can be used) for the duration of the COVID-19 declaration under Se ction 564(b)(1) of the Act, 21 U.S.C. section 360bbb-3(b)(1), unless the authorization is terminated or revoked sooner.  Performed at [facility],  [place] [address], [place], [place] [address]      Labs: Basic Metabolic Panel: Recent Labs  Lab 10/03/20 1646 10/04/20 0019 10/05/20 0321  NA 135 141 135  K 4.3 3.6 3.5  CL 97* 104  96*  CO2 31 34* 32  GLUCOSE 120* 104* 116*  BUN 13 8 6*  CREATININE 0.72 0.62 0.43*  CALCIUM 9.5 9.3 9.2   Liver Function Tests: Recent Labs  Lab 10/03/20 1646  AST 17  ALT 12  ALKPHOS 63  BILITOT 1.5*  PROT 6.6  ALBUMIN 3.6   Recent Labs  Lab 10/03/20 1646  LIPASE 25   No results for input(s): AMMONIA in the last 168 hours. CBC: Recent Labs  Lab 10/03/20 1646 10/04/20 0019  WBC 5.1 4.0  NEUTROABS 4.2  --   HGB 13.4 12.8  HCT 41.3 39.7  MCV 105.9* 106.7*  PLT 311 284   Cardiac Enzymes: No results for input(s): CKTOTAL, CKMB, CKMBINDEX, TROPONINI in the last 168 hours. BNP: BNP (last 3 results) No results for input(s): BNP in the last 8760 hours.  ProBNP (last 3 results) No results for input(s): PROBNP in the last 8760 hours.  CBG: No results for input(s): GLUCAP in the last 168 hours.     Signed:  Florencia Reasons MD, PhD, FACP  [facility] 10/06/2020, 11:39 AM

## 2020-10-07 ENCOUNTER — Telehealth: Payer: Self-pay

## 2020-10-07 ENCOUNTER — Telehealth: Payer: Self-pay | Admitting: Internal Medicine

## 2020-10-07 DIAGNOSIS — I110 Hypertensive heart disease with heart failure: Secondary | ICD-10-CM | POA: Diagnosis not present

## 2020-10-07 DIAGNOSIS — M4807 Spinal stenosis, lumbosacral region: Secondary | ICD-10-CM | POA: Diagnosis not present

## 2020-10-07 DIAGNOSIS — I700 Atherosclerosis of aorta: Secondary | ICD-10-CM | POA: Diagnosis not present

## 2020-10-07 DIAGNOSIS — K649 Unspecified hemorrhoids: Secondary | ICD-10-CM | POA: Diagnosis not present

## 2020-10-07 DIAGNOSIS — M810 Age-related osteoporosis without current pathological fracture: Secondary | ICD-10-CM | POA: Diagnosis not present

## 2020-10-07 DIAGNOSIS — G8929 Other chronic pain: Secondary | ICD-10-CM | POA: Diagnosis not present

## 2020-10-07 DIAGNOSIS — G4733 Obstructive sleep apnea (adult) (pediatric): Secondary | ICD-10-CM | POA: Diagnosis not present

## 2020-10-07 DIAGNOSIS — K5900 Constipation, unspecified: Secondary | ICD-10-CM | POA: Diagnosis not present

## 2020-10-07 DIAGNOSIS — M48061 Spinal stenosis, lumbar region without neurogenic claudication: Secondary | ICD-10-CM | POA: Diagnosis not present

## 2020-10-07 DIAGNOSIS — I5030 Unspecified diastolic (congestive) heart failure: Secondary | ICD-10-CM | POA: Diagnosis not present

## 2020-10-07 DIAGNOSIS — M160 Bilateral primary osteoarthritis of hip: Secondary | ICD-10-CM | POA: Diagnosis not present

## 2020-10-07 DIAGNOSIS — N390 Urinary tract infection, site not specified: Secondary | ICD-10-CM | POA: Diagnosis not present

## 2020-10-07 DIAGNOSIS — I429 Cardiomyopathy, unspecified: Secondary | ICD-10-CM | POA: Diagnosis not present

## 2020-10-07 DIAGNOSIS — K5730 Diverticulosis of large intestine without perforation or abscess without bleeding: Secondary | ICD-10-CM | POA: Diagnosis not present

## 2020-10-07 DIAGNOSIS — I2510 Atherosclerotic heart disease of native coronary artery without angina pectoris: Secondary | ICD-10-CM | POA: Diagnosis not present

## 2020-10-07 DIAGNOSIS — B9620 Unspecified Escherichia coli [E. coli] as the cause of diseases classified elsewhere: Secondary | ICD-10-CM | POA: Diagnosis not present

## 2020-10-07 DIAGNOSIS — M47816 Spondylosis without myelopathy or radiculopathy, lumbar region: Secondary | ICD-10-CM | POA: Diagnosis not present

## 2020-10-07 DIAGNOSIS — F418 Other specified anxiety disorders: Secondary | ICD-10-CM | POA: Diagnosis not present

## 2020-10-07 DIAGNOSIS — E785 Hyperlipidemia, unspecified: Secondary | ICD-10-CM | POA: Diagnosis not present

## 2020-10-07 DIAGNOSIS — M4317 Spondylolisthesis, lumbosacral region: Secondary | ICD-10-CM | POA: Diagnosis not present

## 2020-10-07 DIAGNOSIS — K449 Diaphragmatic hernia without obstruction or gangrene: Secondary | ICD-10-CM | POA: Diagnosis not present

## 2020-10-07 DIAGNOSIS — G43909 Migraine, unspecified, not intractable, without status migrainosus: Secondary | ICD-10-CM | POA: Diagnosis not present

## 2020-10-07 DIAGNOSIS — D751 Secondary polycythemia: Secondary | ICD-10-CM | POA: Diagnosis not present

## 2020-10-07 DIAGNOSIS — D500 Iron deficiency anemia secondary to blood loss (chronic): Secondary | ICD-10-CM | POA: Diagnosis not present

## 2020-10-07 DIAGNOSIS — H9203 Otalgia, bilateral: Secondary | ICD-10-CM | POA: Diagnosis not present

## 2020-10-07 MED ORDER — METOPROLOL TARTRATE 50 MG PO TABS: 50.0000 mg | ORAL_TABLET | Freq: Two times a day (BID) | ORAL | 1 refills | Status: DC

## 2020-10-07 NOTE — Telephone Encounter (Signed)
Caller: Olena Mater) PT Call back # [phone]  Patient came home from hospital with physical therapy orders.  Assessment were done today  Need verbal order   2 times a week for 5 weeks 1 time a week for 1 week  Also patient need a refill on   metoprolol tartrate (LOPRESSOR) 50 MG tablet [[id number]]   [id number] [address], [address] AT [facility] [address]  [place], [place] [address]  Phone:  [phone] Fax:  [phone]

## 2020-10-07 NOTE — Telephone Encounter (Signed)
Transition Care Management Follow-up Telephone Call  Date of discharge and from where: 10/06/20-[facility]  How have you been since you were released from the hospital? Still having some pain in hips with movement.   Any questions or concerns? No  Items Reviewed:  Did the pt receive and understand the discharge instructions provided? Yes   Medications obtained and verified? Yes   Other? Yes   Any new allergies since your discharge? No   Dietary orders reviewed? Yes  Do you have support at home? No not all the time but she states she has care aides coming in 3 x per week.  Home Care and Equipment/Supplies: Were home health services ordered? yes If so, what is the name of the agency? [facility]  Has the agency set up a time to come to the patient's home? yes Were any new equipment or medical supplies ordered?  No What is the name of the medical supply agency? n/a Were you able to get the supplies/equipment? not applicable Do you have any questions related to the use of the equipment or supplies? n/a  Functional Questionnaire: (I = Independent and D = Dependent) ADLs: I with assistance  Bathing/Dressing- I with assistance  Meal Prep- I  Eating- I   Maintaining continence- I  Transferring/Ambulation- I with  walker  Managing Meds- I  Follow up appointments reviewed:   PCP Hospital f/u appt confirmed? No  Patient refused & states she will call back to schedule  Specialist Hospital f/u appt confirmed? n/a   Are transportation arrangements needed? No   If their condition worsens, is the pt aware to call PCP or go to the Emergency Dept.? Yes  Was the patient provided with contact information for the PCP's office or ED? Yes  Was to pt encouraged to call back with questions or concerns? Yes

## 2020-10-07 NOTE — Telephone Encounter (Signed)
Patient was in the hospital.  Our nurse called for the TCM call today and this what was said:   "TCM call completed. Patient refused to make at appt at [facility] time. She states she will call back to schedule."   Refill sent for metoprolol.

## 2020-10-07 NOTE — Telephone Encounter (Signed)
Okay verbal order for physical therapy, okay RF metoprolol if needed

## 2020-10-09 LAB — CULTURE, BLOOD (ROUTINE X 2)
Culture: NO GROWTH
Culture: NO GROWTH
Special Requests: ADEQUATE
Special Requests: ADEQUATE

## 2020-10-11 ENCOUNTER — Telehealth: Payer: Self-pay

## 2020-10-11 DIAGNOSIS — I5030 Unspecified diastolic (congestive) heart failure: Secondary | ICD-10-CM | POA: Diagnosis not present

## 2020-10-11 DIAGNOSIS — I110 Hypertensive heart disease with heart failure: Secondary | ICD-10-CM | POA: Diagnosis not present

## 2020-10-11 DIAGNOSIS — B9620 Unspecified Escherichia coli [E. coli] as the cause of diseases classified elsewhere: Secondary | ICD-10-CM | POA: Diagnosis not present

## 2020-10-11 DIAGNOSIS — N390 Urinary tract infection, site not specified: Secondary | ICD-10-CM | POA: Diagnosis not present

## 2020-10-11 DIAGNOSIS — I2510 Atherosclerotic heart disease of native coronary artery without angina pectoris: Secondary | ICD-10-CM | POA: Diagnosis not present

## 2020-10-11 DIAGNOSIS — K5730 Diverticulosis of large intestine without perforation or abscess without bleeding: Secondary | ICD-10-CM | POA: Diagnosis not present

## 2020-10-11 MED ORDER — HYDROCODONE-ACETAMINOPHEN 5-325 MG PO TABS: 1.0000 | ORAL_TABLET | Freq: Three times a day (TID) | ORAL | 0 refills | Status: DC | PRN

## 2020-10-11 NOTE — Telephone Encounter (Signed)
Spoke w/ Roselie Awkward, verbal orders given .

## 2020-10-11 NOTE — Telephone Encounter (Signed)
Spoke with the patient's daughter Early Chars, after hospital admission pain medicine was changed from hydrocodone to tramadol. Tramadol is not doing the job, she is having terrible pain. On chart review, she previously tolerated hydrocodone well, it was a stopped due to MS changes (but in the context of a UTI). I remind her the risk of MS changes and falls with hydrocodone but at the end we agreed that she needs better pain control.  Fortunately she has supervision 24/7. Plan: Stop tramadol Hydrocodone 3 times daily, try to minimize the use by using Tylenol instead if the pain is not too severe. Early Chars verbalized understanding and we agreed that she will pass this information to the patient.

## 2020-10-11 NOTE — Telephone Encounter (Signed)
Pt called stating that she was in the hospital on 10/03/2020, she stated that she is in a lot of pain and was given Tramadol for pain but wants to know if she can get a Rx for more pain medication. Please advise. -JMA

## 2020-10-11 NOTE — Telephone Encounter (Signed)
[name], Early Chars, called to follow up on this medication request.  She is getting concerned and wanted to know if PCP had a chance to review original request.  I advised her PCP has been in with patients all day and it would be addressed as he could get to it.  Early Chars stated she was not trying to be pushy about the request it was just a concern that it may not get handled before the end of the day and they really need the patient to have the medication as she is experiencing breakthrough pain in between the 50 mg dosing of Tramadol and they need something to be done before bedtime.  Early Chars is wondering if the medication can be increased or what else can possibly be done for the patient for pain.  Please advise.

## 2020-10-11 NOTE — Addendum Note (Signed)
Addended by: Kathlene November E on: 10/11/2020 04:46 PM   Modules accepted: Orders

## 2020-10-14 DIAGNOSIS — N390 Urinary tract infection, site not specified: Secondary | ICD-10-CM | POA: Diagnosis not present

## 2020-10-14 DIAGNOSIS — I2510 Atherosclerotic heart disease of native coronary artery without angina pectoris: Secondary | ICD-10-CM | POA: Diagnosis not present

## 2020-10-14 DIAGNOSIS — I110 Hypertensive heart disease with heart failure: Secondary | ICD-10-CM | POA: Diagnosis not present

## 2020-10-14 DIAGNOSIS — B9620 Unspecified Escherichia coli [E. coli] as the cause of diseases classified elsewhere: Secondary | ICD-10-CM | POA: Diagnosis not present

## 2020-10-14 DIAGNOSIS — I5030 Unspecified diastolic (congestive) heart failure: Secondary | ICD-10-CM | POA: Diagnosis not present

## 2020-10-14 DIAGNOSIS — K5730 Diverticulosis of large intestine without perforation or abscess without bleeding: Secondary | ICD-10-CM | POA: Diagnosis not present

## 2020-10-17 ENCOUNTER — Other Ambulatory Visit: Payer: Self-pay | Admitting: Family

## 2020-10-17 DIAGNOSIS — D751 Secondary polycythemia: Secondary | ICD-10-CM

## 2020-10-18 ENCOUNTER — Telehealth: Payer: Self-pay | Admitting: Internal Medicine

## 2020-10-18 DIAGNOSIS — N390 Urinary tract infection, site not specified: Secondary | ICD-10-CM | POA: Diagnosis not present

## 2020-10-18 DIAGNOSIS — I5030 Unspecified diastolic (congestive) heart failure: Secondary | ICD-10-CM | POA: Diagnosis not present

## 2020-10-18 DIAGNOSIS — K5730 Diverticulosis of large intestine without perforation or abscess without bleeding: Secondary | ICD-10-CM | POA: Diagnosis not present

## 2020-10-18 DIAGNOSIS — B9620 Unspecified Escherichia coli [E. coli] as the cause of diseases classified elsewhere: Secondary | ICD-10-CM | POA: Diagnosis not present

## 2020-10-18 DIAGNOSIS — I2510 Atherosclerotic heart disease of native coronary artery without angina pectoris: Secondary | ICD-10-CM | POA: Diagnosis not present

## 2020-10-18 DIAGNOSIS — I110 Hypertensive heart disease with heart failure: Secondary | ICD-10-CM | POA: Diagnosis not present

## 2020-10-18 MED ORDER — HYDROCODONE-ACETAMINOPHEN 5-325 MG PO TABS: 1.0000 | ORAL_TABLET | Freq: Three times a day (TID) | ORAL | 0 refills | Status: DC | PRN

## 2020-10-18 NOTE — Telephone Encounter (Signed)
Medication:HYDROcodone-acetaminophen (NORCO/VICODIN) 5-325 MG   Mom is out and needs meds asap  Has the patient contacted their pharmacy? Yes.   (If no, request that the patient contact the pharmacy for the refill.) (If yes, when and what did the pharmacy advise?)  Call PCP  Preferred Pharmacy (with phone number or street name):[id number] - [address], [address] AT [address], [place] [address]  Phone:  [phone] Fax:  [phone]    RX refills may take up to 3 business days. We ask that you follow-up with your pharmacy.

## 2020-10-18 NOTE — Telephone Encounter (Signed)
Requesting: hydrocodone 5-325mg  Contract: 04/22/2020 UDS: 06/29/2019 Last Visit: 08/22/2020 Next Visit: 11/21/2020 Last Refill: 10/11/2020 #21 and 0RF Pt sig: 1 tab tid prn  Please Advise

## 2020-10-18 NOTE — Telephone Encounter (Signed)
PDMP okay, prescribed 2-week supply.

## 2020-10-19 ENCOUNTER — Inpatient Hospital Stay (HOSPITAL_BASED_OUTPATIENT_CLINIC_OR_DEPARTMENT_OTHER): Payer: Medicare Other | Admitting: Family

## 2020-10-19 ENCOUNTER — Other Ambulatory Visit: Payer: Self-pay

## 2020-10-19 ENCOUNTER — Inpatient Hospital Stay: Payer: Medicare Other

## 2020-10-19 ENCOUNTER — Inpatient Hospital Stay: Payer: Medicare Other | Attending: Hematology & Oncology

## 2020-10-19 ENCOUNTER — Encounter: Payer: Self-pay | Admitting: Family

## 2020-10-19 ENCOUNTER — Telehealth: Payer: Self-pay

## 2020-10-19 VITALS — BP 160/78 | HR 66 | Temp 99.1°F | Resp 18

## 2020-10-19 DIAGNOSIS — M4807 Spinal stenosis, lumbosacral region: Secondary | ICD-10-CM | POA: Diagnosis not present

## 2020-10-19 DIAGNOSIS — D45 Polycythemia vera: Secondary | ICD-10-CM | POA: Insufficient documentation

## 2020-10-19 DIAGNOSIS — R399 Unspecified symptoms and signs involving the genitourinary system: Secondary | ICD-10-CM | POA: Diagnosis not present

## 2020-10-19 DIAGNOSIS — M47816 Spondylosis without myelopathy or radiculopathy, lumbar region: Secondary | ICD-10-CM | POA: Diagnosis not present

## 2020-10-19 DIAGNOSIS — K635 Polyp of colon: Secondary | ICD-10-CM

## 2020-10-19 DIAGNOSIS — M25551 Pain in right hip: Secondary | ICD-10-CM | POA: Insufficient documentation

## 2020-10-19 DIAGNOSIS — Z9181 History of falling: Secondary | ICD-10-CM

## 2020-10-19 DIAGNOSIS — Z7983 Long term (current) use of bisphosphonates: Secondary | ICD-10-CM

## 2020-10-19 DIAGNOSIS — H9203 Otalgia, bilateral: Secondary | ICD-10-CM

## 2020-10-19 DIAGNOSIS — G43909 Migraine, unspecified, not intractable, without status migrainosus: Secondary | ICD-10-CM

## 2020-10-19 DIAGNOSIS — Z96652 Presence of left artificial knee joint: Secondary | ICD-10-CM

## 2020-10-19 DIAGNOSIS — R21 Rash and other nonspecific skin eruption: Secondary | ICD-10-CM

## 2020-10-19 DIAGNOSIS — M810 Age-related osteoporosis without current pathological fracture: Secondary | ICD-10-CM

## 2020-10-19 DIAGNOSIS — D500 Iron deficiency anemia secondary to blood loss (chronic): Secondary | ICD-10-CM

## 2020-10-19 DIAGNOSIS — Z853 Personal history of malignant neoplasm of breast: Secondary | ICD-10-CM | POA: Diagnosis not present

## 2020-10-19 DIAGNOSIS — I2510 Atherosclerotic heart disease of native coronary artery without angina pectoris: Secondary | ICD-10-CM | POA: Diagnosis not present

## 2020-10-19 DIAGNOSIS — M4317 Spondylolisthesis, lumbosacral region: Secondary | ICD-10-CM | POA: Diagnosis not present

## 2020-10-19 DIAGNOSIS — D751 Secondary polycythemia: Secondary | ICD-10-CM

## 2020-10-19 DIAGNOSIS — M25559 Pain in unspecified hip: Secondary | ICD-10-CM | POA: Diagnosis not present

## 2020-10-19 DIAGNOSIS — M160 Bilateral primary osteoarthritis of hip: Secondary | ICD-10-CM

## 2020-10-19 DIAGNOSIS — N390 Urinary tract infection, site not specified: Secondary | ICD-10-CM

## 2020-10-19 DIAGNOSIS — Z8673 Personal history of transient ischemic attack (TIA), and cerebral infarction without residual deficits: Secondary | ICD-10-CM

## 2020-10-19 DIAGNOSIS — M545 Low back pain, unspecified: Secondary | ICD-10-CM | POA: Insufficient documentation

## 2020-10-19 DIAGNOSIS — I429 Cardiomyopathy, unspecified: Secondary | ICD-10-CM

## 2020-10-19 DIAGNOSIS — B9620 Unspecified Escherichia coli [E. coli] as the cause of diseases classified elsewhere: Secondary | ICD-10-CM | POA: Diagnosis not present

## 2020-10-19 DIAGNOSIS — G8929 Other chronic pain: Secondary | ICD-10-CM

## 2020-10-19 DIAGNOSIS — D509 Iron deficiency anemia, unspecified: Secondary | ICD-10-CM | POA: Diagnosis not present

## 2020-10-19 DIAGNOSIS — Z79891 Long term (current) use of opiate analgesic: Secondary | ICD-10-CM

## 2020-10-19 DIAGNOSIS — S22070A Wedge compression fracture of T9-T10 vertebra, initial encounter for closed fracture: Secondary | ICD-10-CM

## 2020-10-19 DIAGNOSIS — Z7982 Long term (current) use of aspirin: Secondary | ICD-10-CM

## 2020-10-19 DIAGNOSIS — Z86718 Personal history of other venous thrombosis and embolism: Secondary | ICD-10-CM

## 2020-10-19 DIAGNOSIS — I5030 Unspecified diastolic (congestive) heart failure: Secondary | ICD-10-CM | POA: Diagnosis not present

## 2020-10-19 DIAGNOSIS — K649 Unspecified hemorrhoids: Secondary | ICD-10-CM

## 2020-10-19 DIAGNOSIS — M25552 Pain in left hip: Secondary | ICD-10-CM | POA: Diagnosis not present

## 2020-10-19 DIAGNOSIS — G4733 Obstructive sleep apnea (adult) (pediatric): Secondary | ICD-10-CM

## 2020-10-19 DIAGNOSIS — Z87440 Personal history of urinary (tract) infections: Secondary | ICD-10-CM | POA: Insufficient documentation

## 2020-10-19 DIAGNOSIS — K5730 Diverticulosis of large intestine without perforation or abscess without bleeding: Secondary | ICD-10-CM | POA: Diagnosis not present

## 2020-10-19 DIAGNOSIS — F418 Other specified anxiety disorders: Secondary | ICD-10-CM

## 2020-10-19 DIAGNOSIS — Z87891 Personal history of nicotine dependence: Secondary | ICD-10-CM

## 2020-10-19 DIAGNOSIS — Z79899 Other long term (current) drug therapy: Secondary | ICD-10-CM

## 2020-10-19 DIAGNOSIS — I700 Atherosclerosis of aorta: Secondary | ICD-10-CM

## 2020-10-19 DIAGNOSIS — M48061 Spinal stenosis, lumbar region without neurogenic claudication: Secondary | ICD-10-CM | POA: Diagnosis not present

## 2020-10-19 DIAGNOSIS — K449 Diaphragmatic hernia without obstruction or gangrene: Secondary | ICD-10-CM

## 2020-10-19 DIAGNOSIS — K5900 Constipation, unspecified: Secondary | ICD-10-CM

## 2020-10-19 DIAGNOSIS — I110 Hypertensive heart disease with heart failure: Secondary | ICD-10-CM | POA: Diagnosis not present

## 2020-10-19 DIAGNOSIS — I8390 Asymptomatic varicose veins of unspecified lower extremity: Secondary | ICD-10-CM

## 2020-10-19 DIAGNOSIS — E785 Hyperlipidemia, unspecified: Secondary | ICD-10-CM | POA: Diagnosis not present

## 2020-10-19 LAB — CBC WITH DIFFERENTIAL (CANCER CENTER ONLY)
Abs Immature Granulocytes: 0.01 10*3/uL (ref 0.00–0.07)
Basophils Absolute: 0.1 10*3/uL (ref 0.0–0.1)
Basophils Relative: 1 %
Eosinophils Absolute: 0.1 10*3/uL (ref 0.0–0.5)
Eosinophils Relative: 1 %
HCT: 47.4 % — ABNORMAL HIGH (ref 36.0–46.0)
Hemoglobin: 15.7 g/dL — ABNORMAL HIGH (ref 12.0–15.0)
Immature Granulocytes: 0 %
Lymphocytes Relative: 8 %
Lymphs Abs: 0.4 10*3/uL — ABNORMAL LOW (ref 0.7–4.0)
MCH: 34.8 pg — ABNORMAL HIGH (ref 26.0–34.0)
MCHC: 33.1 g/dL (ref 30.0–36.0)
MCV: 105.1 fL — ABNORMAL HIGH (ref 80.0–100.0)
Monocytes Absolute: 0.5 10*3/uL (ref 0.1–1.0)
Monocytes Relative: 9 %
Neutro Abs: 4.4 10*3/uL (ref 1.7–7.7)
Neutrophils Relative %: 81 %
Platelet Count: 315 10*3/uL (ref 150–400)
RBC: 4.51 MIL/uL (ref 3.87–5.11)
RDW: 14.6 % (ref 11.5–15.5)
WBC Count: 5.5 10*3/uL (ref 4.0–10.5)
nRBC: 0 % (ref 0.0–0.2)

## 2020-10-19 LAB — URINALYSIS, COMPLETE (UACMP) WITH MICROSCOPIC
Glucose, UA: NEGATIVE mg/dL
Hgb urine dipstick: NEGATIVE
Ketones, ur: NEGATIVE mg/dL
Leukocytes,Ua: NEGATIVE
Nitrite: NEGATIVE
Protein, ur: NEGATIVE mg/dL
Specific Gravity, Urine: 1.015 (ref 1.005–1.030)
pH: 6 (ref 5.0–8.0)

## 2020-10-19 LAB — LACTATE DEHYDROGENASE: LDH: 155 U/L (ref 98–192)

## 2020-10-19 MED ORDER — KETOROLAC TROMETHAMINE 60 MG/2ML IM SOLN: 30.0000 mg | Freq: Once | INTRAMUSCULAR | Status: DC

## 2020-10-19 MED ORDER — KETOROLAC TROMETHAMINE 15 MG/ML IJ SOLN
INTRAMUSCULAR | Status: AC
  Filled 2020-10-19: qty 2

## 2020-10-19 MED ORDER — KETOROLAC TROMETHAMINE 15 MG/ML IJ SOLN
30.0000 mg | Freq: Once | INTRAMUSCULAR | Status: AC
Start: 2020-10-19 — End: 2020-10-19
  Administered 2020-10-19: 30 mg via INTRAVENOUS

## 2020-10-19 NOTE — Progress Notes (Signed)
Hematology and Oncology Follow Up Visit  Renelda Kilian [id number] [birth year] 87 y.o. 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 Reimann is here today for follow-up. She is quite miserable and tearful today during our visit. She has having mid to lower back and bilateral hip pain. She had a new compression fracture at T9 and old at T5 and T10 noted on CT scan in February. She has seen Dr. Alvan Dame in the past so we will refer her back to him for further work up and treatment.  Hct is 47% but she does not feel like staying for phlebotomy today and prefers to wait until next visit.  She verbalized that she is taking her Hydrea daily as prescribed.  No fever, chills, n/v, cough, rash, dizziness, SOB, chest pain, palpitations, abdominal pain or changes in bowel or bladder habits.  She was recently in the hospital with a UTI. We did repeat UA and C&S today. Results pending.  No swelling, numbness or tingling in her extremities at this time.  No recent falls or syncope to report.  She does not have much of an appetite but is trying to stay hydrated.   ECOG Performance Status: 1 - Symptomatic but completely ambulatory  Medications:  Allergies as of 10/19/2020      Reactions   Penicillins Hives, Rash, Other (See Comments)   Has patient had a PCN reaction causing immediate rash, facial/tongue/throat swelling, SOB or lightheadedness with hypotension: Yes Has patient had a PCN reaction causing severe rash involving mucus membranes or skin necrosis: yes Has patient had a PCN reaction that required hospitalization: No Has patient had a  PCN reaction occurring within the last 10 years: no If all of the above answers are "NO", then may proceed with Cephalosporin use. Other Reaction: OTHER REACTION   Valsartan Itching, Other (See Comments)   Atorvastatin Diarrhea   Carvedilol Other (See Comments)   "teeth hurt when I took it"   Ezetimibe Nausea Only   Fluvastatin Sodium Other (See Comments)   "Can't remember"   Magnesium Hydroxide Other (See Comments)   REACTION: triggers HAs   Meloxicam Other (See Comments)   Pt seeing auro's / spots.     Pneumovax [pneumococcal Polysaccharide Vaccine] Other (See Comments)   Local reaction   Quinapril Hcl Other (See Comments)    "feelings of tiredness"   Simvastatin Diarrhea   Topamax [topiramate] Itching   Vit D-vit E-safflower Oil Other (See Comments)   Headaches      Medication List       Accurate as of October 19, 2020  4:57 PM. If you have any questions, ask your nurse or doctor.        alendronate 70 MG tablet Commonly known as: FOSAMAX Take 1 tablet (70 mg total) by mouth every 7 (seven) days. Take with a full glass of water on an empty stomach.   aspirin 81 MG EC tablet Take 1 tablet (81 mg total) by mouth daily. Swallow whole.   bismuth subsalicylate 132 GM/01UU suspension Commonly known as: PEPTO BISMOL Take 30 mLs by mouth every 4 (four) hours as needed for indigestion (heartburn).   furosemide 20 MG tablet  Commonly known as: Lasix Take 1 tablet (20 mg total) by mouth daily. What changed:   when to take this  reasons to take this   HYDROcodone-acetaminophen 5-325 MG tablet Commonly known as: NORCO/VICODIN Take 1 tablet by mouth 3 (three) times daily as needed for severe pain.   hydroxyurea 500 MG capsule Commonly known as: HYDREA TAKE 1 CAPSULE BY MOUTH EVERY DAY. MAY TAKE WITH FOOD TO MINIMIZE GI SIDE EFFECTS   lactose free nutrition Liqd Take 237 mLs by mouth daily.   Lidocaine 4 % Ptch LIDOCAINE 4% PATCH :Apply 1 patch topically daily and remove  after 12 hours to left lateral chest What changed:   how much to take  how to take this  when to take this  additional instructions   LORazepam 0.5 MG tablet Commonly known as: ATIVAN TAKE 1/2 TABLET BY MOUTH EVERY MORNING AND EVERY AFTERNOON THEN TAKE 2 TABLETS BY MOUTH AT BEDTIME AS NEEDED What changed: See the new instructions.   metoprolol tartrate 50 MG tablet Commonly known as: LOPRESSOR Take 1 tablet (50 mg total) by mouth 2 (two) times daily.   nitroGLYCERIN 0.4 MG SL tablet Commonly known as: NITROSTAT Place 1 tablet (0.4 mg total) under the tongue every 5 (five) minutes x 3 doses as needed for chest pain.   omeprazole 20 MG capsule Commonly known as: PRILOSEC Take 1 capsule (20 mg total) by mouth daily.   polyethylene glycol 17 g packet Commonly known as: MIRALAX / GLYCOLAX Take 17 g by mouth daily.   senna-docusate 8.6-50 MG tablet Commonly known as: Senokot-S Take 1 tablet by mouth 2 (two) times daily.       Allergies:  Allergies  Allergen Reactions  . Penicillins Hives, Rash and Other (See Comments)    Has patient had a PCN reaction causing immediate rash, facial/tongue/throat swelling, SOB or lightheadedness with hypotension: Yes Has patient had a PCN reaction causing severe rash involving mucus membranes or skin necrosis: yes Has patient had a PCN reaction that required hospitalization: No Has patient had a PCN reaction occurring within the last 10 years: no If all of the above answers are "NO", then may proceed with Cephalosporin use.  Other Reaction: OTHER REACTION  . Valsartan Itching and Other (See Comments)  . Atorvastatin Diarrhea  . Carvedilol Other (See Comments)    "teeth hurt when I took it"   . Ezetimibe Nausea Only  . Fluvastatin Sodium Other (See Comments)    "Can't remember"  . Magnesium Hydroxide Other (See Comments)    REACTION: triggers HAs  . Meloxicam Other (See Comments)    Pt seeing auro's / spots.    . Pneumovax  [Pneumococcal Polysaccharide Vaccine] Other (See Comments)    Local reaction  . Quinapril Hcl Other (See Comments)     "feelings of tiredness"  . Simvastatin Diarrhea  . Topamax [Topiramate] Itching  . Vit D-Vit E-Safflower Oil Other (See Comments)    Headaches    Past Medical History, Surgical history, Social history, and Family History were reviewed and updated.  Review of Systems: All other 10 point review of systems is negative.   Physical Exam:  oral temperature is 99.1 F (37.3 C). Her blood pressure is 160/78 (abnormal) and her pulse is 66. Her respiration is 18 and oxygen saturation is 100%.   Wt Readings from Last 3 Encounters:  10/03/20 113 lb (51.3 kg)  09/07/20 116 lb 1.9 oz (52.7 kg)  08/22/20 115 lb 9.6 oz (52.4 kg)  Ocular: Sclerae unicteric, pupils equal, round and reactive to light Ear-nose-throat: Oropharynx clear, dentition fair Lymphatic: No cervical or supraclavicular adenopathy Lungs no rales or rhonchi, good excursion bilaterally Heart regular rate and rhythm, no murmur appreciated Abd soft, nontender, positive bowel sounds MSK no focal spinal tenderness, no joint edema Neuro: non-focal, well-oriented, appropriate affect Breasts: Deferred   Lab Results  Component Value Date   WBC 5.5 10/19/2020   HGB 15.7 (H) 10/19/2020   HCT 47.4 (H) 10/19/2020   MCV 105.1 (H) 10/19/2020   PLT 315 10/19/2020   Lab Results  Component Value Date   FERRITIN 538 (H) 09/07/2020   IRON 190 (H) 09/07/2020   TIBC 263 09/07/2020   UIBC 73 (L) 09/07/2020   IRONPCTSAT 72 (H) 09/07/2020   Lab Results  Component Value Date   RETICCTPCT 0.8 02/02/2015   RBC 4.51 10/19/2020   RETICCTABS 41.4 02/02/2015   No results found for: KPAFRELGTCHN, LAMBDASER, KAPLAMBRATIO No results found for: IGGSERUM, IGA, IGMSERUM No results found for: Kathrynn Ducking, MSPIKE, SPEI   Chemistry      Component Value Date/Time   NA 135  10/05/2020 0321   NA 142 03/25/2017 1457   NA 139 04/10/2016 0855   K 3.5 10/05/2020 0321   K 4.1 03/25/2017 1457   K 4.2 04/10/2016 0855   CL 96 (L) 10/05/2020 0321   CL 96 (L) 03/25/2017 1457   CO2 32 10/05/2020 0321   CO2 32 03/25/2017 1457   CO2 29 04/10/2016 0855   BUN 6 (L) 10/05/2020 0321   BUN 7 03/25/2017 1457   BUN 10.0 04/10/2016 0855   CREATININE 0.43 (L) 10/05/2020 0321   CREATININE 0.79 09/07/2020 1050   CREATININE 0.55 (L) 04/22/2020 1444   CREATININE 0.8 04/10/2016 0855      Component Value Date/Time   CALCIUM 9.2 10/05/2020 0321   CALCIUM 9.8 03/25/2017 1457   CALCIUM 9.5 04/10/2016 0855   ALKPHOS 63 10/03/2020 1646   ALKPHOS 86 (H) 03/25/2017 1457   ALKPHOS 85 04/10/2016 0855   AST 17 10/03/2020 1646   AST 27 09/07/2020 1050   AST 19 04/10/2016 0855   ALT 12 10/03/2020 1646   ALT 35 09/07/2020 1050   ALT 15 03/25/2017 1457   ALT 13 04/10/2016 0855   BILITOT 1.5 (H) 10/03/2020 1646   BILITOT 1.8 (H) 09/07/2020 1050   BILITOT 1.26 (H) 04/10/2016 0855       Impression and Plan: Ms. Beel is a very pleasant 87 yo caucasian female with polycythemia and history of stage I infiltrating ductal carcinoma of the left breast with lumpectomy in 2013as well as polycythemia, JAK2 positive. She will continue her same regimen with Hydrea. No phlebotomy today per patient request.  She was given Toradol 30 mg IM for back and hip pain. She tolerated this well.  Referral placed for Dr. Alvan Dame.  We will see her again in 6 weeks.  She can contact our office with any questions or concerns.   [name], NP 6/8/20224:57 PM

## 2020-10-19 NOTE — Telephone Encounter (Signed)
Plan of care signed and faxed back to [facility] at [phone]. Form sent for scanning.  

## 2020-10-19 NOTE — Progress Notes (Signed)
Patient complained of 10/10 pain in back, Toradol IM per Judson Roch, NP ordered.no phlebotomy needed today. patient declined waiting after injection given for revaluation of pain. Discharged with no further complaints via wheelchair with caregiver.

## 2020-10-20 ENCOUNTER — Telehealth: Payer: Self-pay

## 2020-10-20 ENCOUNTER — Telehealth: Payer: Self-pay | Admitting: *Deleted

## 2020-10-20 DIAGNOSIS — B9620 Unspecified Escherichia coli [E. coli] as the cause of diseases classified elsewhere: Secondary | ICD-10-CM | POA: Diagnosis not present

## 2020-10-20 DIAGNOSIS — K5730 Diverticulosis of large intestine without perforation or abscess without bleeding: Secondary | ICD-10-CM | POA: Diagnosis not present

## 2020-10-20 DIAGNOSIS — I110 Hypertensive heart disease with heart failure: Secondary | ICD-10-CM | POA: Diagnosis not present

## 2020-10-20 DIAGNOSIS — I5030 Unspecified diastolic (congestive) heart failure: Secondary | ICD-10-CM | POA: Diagnosis not present

## 2020-10-20 DIAGNOSIS — I2510 Atherosclerotic heart disease of native coronary artery without angina pectoris: Secondary | ICD-10-CM | POA: Diagnosis not present

## 2020-10-20 DIAGNOSIS — N390 Urinary tract infection, site not specified: Secondary | ICD-10-CM | POA: Diagnosis not present

## 2020-10-20 LAB — IRON AND TIBC
Iron: 66 ug/dL (ref 41–142)
Saturation Ratios: 24 % (ref 21–57)
TIBC: 279 ug/dL (ref 236–444)
UIBC: 213 ug/dL (ref 120–384)

## 2020-10-20 LAB — FERRITIN: Ferritin: 454 ng/mL — ABNORMAL HIGH (ref 11–307)

## 2020-10-20 LAB — CEA (IN HOUSE-CHCC): CEA (CHCC-In House): 1.48 ng/mL (ref 0.00–5.00)

## 2020-10-20 NOTE — Telephone Encounter (Signed)
Called pt with appts per 10/19/20 los   [employer]

## 2020-10-20 NOTE — Telephone Encounter (Signed)
Received call from [facility], patients daughter asking for Clarification about patients office visit yesterday with [name] NP.Clarified CT scan results from February and office note from yesterday.

## 2020-10-21 ENCOUNTER — Telehealth: Payer: Self-pay | Admitting: Internal Medicine

## 2020-10-21 ENCOUNTER — Ambulatory Visit: Payer: Medicare Other | Admitting: Cardiology

## 2020-10-21 LAB — URINE CULTURE: Culture: <10000 — AB

## 2020-10-21 NOTE — Telephone Encounter (Signed)
Requesting: lorazepam 0.5mg   Contract: 04/22/2020 UDS: 06/29/2019 Last Visit: 08/22/2020 Next Visit: 11/21/2020  Last Refill: 08/15/2020 #90 and 1RF Pt sig: 1/2 tab qam and qpm, then 2 tab qhs prn  Please Advise

## 2020-10-21 NOTE — Telephone Encounter (Signed)
PDMP reviewed, too early for refill.  Denied.

## 2020-10-24 MED ORDER — LORAZEPAM 0.5 MG PO TABS: ORAL_TABLET | ORAL | 1 refills | Status: DC

## 2020-10-24 NOTE — Telephone Encounter (Signed)
Please advise 

## 2020-10-24 NOTE — Telephone Encounter (Signed)
Patient does not understand why her medication was denied. She is supposed to take 3 pills a day, it was given a 30 days supply.   Patient is also experiencing back pain, appt was schedule but she would like a phone call back

## 2020-10-24 NOTE — Telephone Encounter (Signed)
Advise patient: My apologies, I was looking the hydrocodone pills she got.  I will refill lorazepam.

## 2020-10-24 NOTE — Telephone Encounter (Signed)
Tried calling Pt to inform that Rx has been sent, no answer, voicemail box has not been set up.

## 2020-10-26 ENCOUNTER — Ambulatory Visit (INDEPENDENT_AMBULATORY_CARE_PROVIDER_SITE_OTHER): Payer: Medicare Other | Admitting: Internal Medicine

## 2020-10-26 ENCOUNTER — Ambulatory Visit: Payer: Medicare Other | Admitting: Internal Medicine

## 2020-10-26 ENCOUNTER — Ambulatory Visit (HOSPITAL_BASED_OUTPATIENT_CLINIC_OR_DEPARTMENT_OTHER)
Admission: RE | Admit: 2020-10-26 | Discharge: 2020-10-26 | Disposition: A | Discharge status: Home / Self Care | Payer: Medicare Other | Source: Ambulatory Visit | Attending: Internal Medicine | Admitting: Internal Medicine

## 2020-10-26 ENCOUNTER — Other Ambulatory Visit: Payer: Self-pay

## 2020-10-26 ENCOUNTER — Encounter: Payer: Self-pay | Admitting: Internal Medicine

## 2020-10-26 VITALS — BP 134/84 | HR 72 | Temp 98.1°F | Resp 18 | Ht 66.0 in

## 2020-10-26 DIAGNOSIS — I2510 Atherosclerotic heart disease of native coronary artery without angina pectoris: Secondary | ICD-10-CM | POA: Diagnosis not present

## 2020-10-26 DIAGNOSIS — M545 Low back pain, unspecified: Secondary | ICD-10-CM | POA: Diagnosis not present

## 2020-10-26 DIAGNOSIS — M25551 Pain in right hip: Secondary | ICD-10-CM | POA: Insufficient documentation

## 2020-10-26 DIAGNOSIS — M533 Sacrococcygeal disorders, not elsewhere classified: Secondary | ICD-10-CM | POA: Diagnosis not present

## 2020-10-26 DIAGNOSIS — R634 Abnormal weight loss: Secondary | ICD-10-CM

## 2020-10-26 DIAGNOSIS — M810 Age-related osteoporosis without current pathological fracture: Secondary | ICD-10-CM | POA: Diagnosis not present

## 2020-10-26 DIAGNOSIS — M549 Dorsalgia, unspecified: Secondary | ICD-10-CM

## 2020-10-26 DIAGNOSIS — G8929 Other chronic pain: Secondary | ICD-10-CM | POA: Insufficient documentation

## 2020-10-26 MED ORDER — CELECOXIB 100 MG PO CAPS: 100.0000 mg | ORAL_CAPSULE | Freq: Two times a day (BID) | ORAL | 0 refills | Status: DC

## 2020-10-26 NOTE — Patient Instructions (Signed)
Go to the first floor and get the x-ray of your back and hip  Start taking Celebrex 100 mg: 1 tablet every  Always take it with food because may cause gastritis and ulcers.  If you notice nausea, stomach pain, change in the color of stools --->  Stop the medicine and let us know  You still can take hydrocodone  Use the lidocaine patch  Go see Dr. Alvan Dame tomorrow.  I could refer you to pain management if you agree.

## 2020-10-26 NOTE — Progress Notes (Signed)
Subjective:    Patient ID: [name], female    DOB: 23-Sep-1929, 87 y.o.   MRN: [id number]  DOS:  10/26/2020 Type of visit - description: Follow-up, here with her caregiver and Shirlean Mylar.   Admitted to the hospital 10/03/2020, discharge 3 days later. She was admitted with increasing low back pain. She also have urinary symptoms. At the emergency room CT abdomen shows some constipation. She was eventually diagnosed with E. coli UTI, blood cultures negative, received Rocephin and discharge on  Keflex.  Developed visual hallucinations, was switched from Norco to Ultram and a lidocaine patch.  She also complains of left hip pain.  X-ray shows no acute changes.   Saw hematology 10/19/2020 for polycythemia and history of left breast cancer but at the same time she received Toradol and was referred to Dr. Alvan Dame for hip pain.  She is here today because pain. Reports pain is in the back, at different places, in the last few days is mostly at the right hip. Denies any right leg numbness. No recent falls.  I also noted weight loss. Caregiver reports that she is eating very little. No fever chills Some nausea with no postprandial abdominal pain. No blood in the stools. No cough  Review of Systems See above   Past Medical History:  Diagnosis Date   CAD (coronary artery disease)    mild nonobstructive disease on cath in 2003   Cancer [facility])    Cardiomyopathy    Probable Takotsubo, severe CP w/ normal cath in 1994. Severe CP in 2003 w/ widespread T wave inversions on ECG. Cath w/ minimal coronary disease but LV-gram showed periapical severe hypokinesis and basilar hyperkinesia (EF 40%). Last echo in 4/09 confirmed full LV functional recovery with EF 60%, no regional wall motion abnormalities, mild to moderate LVH.   Chronic diastolic CHF (congestive heart failure) ([place]) 09/13/2019   CVA (cerebral infarction)    Depression with anxiety 03-29-11   lost husband 3'09   Diverticulitis     DVT (deep venous thrombosis) ([place])    after venous ablation, R leg   Dyspnea    E. coli gastroenteritis 03-29-11   8'10   Fracture 09/10/07   L2, status post vertebroplasty of L2 performed by IR   GERD (gastroesophageal reflux disease)    Headache(784.0)    migraines   Hemorrhoids    Hiatal hernia 03-29-11   no nerve problems   Hyperlipidemia    Hyperplastic colon polyp 06/2001   Hypertension    Iron deficiency anemia due to chronic blood loss 12/30/2017   OA (osteoarthritis) of knee 03-29-11   w/ bilateral knee pain-not a problem now   OSA (obstructive sleep apnea) 03-29-11   no cpap used, not a problem now.   Osteoporosis    Otalgia of both ears    Dr. Simeon Craft   Polycythemia    Stroke [facility]) 03-29-11   CVA x2 -last 10'12-?TIA(visual problems)   Varicose vein     Past Surgical History:  Procedure Laterality Date   APPENDECTOMY     BACK SURGERY     BREAST BIOPSY     BREAST CYST EXCISION     BREAST LUMPECTOMY Left 2013   left stage I left breast cancer   CHOLECYSTECTOMY  04/09/2011   Procedure: LAPAROSCOPIC CHOLECYSTECTOMY WITH INTRAOPERATIVE CHOLANGIOGRAM;  Surgeon: Odis Hollingshead, MD;  Location: [facility];  Service: General;  Laterality: N/A;   Dental Extraction     L maxillary molar   INTRAMEDULLARY (IM) NAIL  INTERTROCHANTERIC Left 12/25/2019   Procedure: INTRAMEDULLARY (IM) NAIL INTERTROCHANTRIC;  Surgeon: Paralee Cancel, MD;  Location: [facility];  Service: Orthopedics;  Laterality: Left;   IR KYPHO THORACIC WITH BONE BIOPSY  09/15/2019   IR KYPHO THORACIC WITH BONE BIOPSY  07/13/2020       IR KYPHO THORACIC WITH BONE BIOPSY  07/13/2020   IR VERTEBROPLASTY CERV/THOR BX INC UNI/BIL INC/INJECT/IMAGING  11/10/2019   KYPHOSIS SURGERY  08/2007   cement used   OVARIAN CYST SURGERY     left   RADIOLOGY WITH ANESTHESIA N/A 11/10/2019   Procedure: VERTEBROPLASTY;  Surgeon: Luanne Bras, MD;  Location: [place];  Service: Radiology;  Laterality: N/A;   TONSILLECTOMY     TOTAL KNEE  ARTHROPLASTY  06/2010   left   TUBAL LIGATION      Allergies as of 10/26/2020       Reactions   Penicillins Hives, Rash, Other (See Comments)   Has patient had a PCN reaction causing immediate rash, facial/tongue/throat swelling, SOB or lightheadedness with hypotension: Yes Has patient had a PCN reaction causing severe rash involving mucus membranes or skin necrosis: yes Has patient had a PCN reaction that required hospitalization: No Has patient had a PCN reaction occurring within the last 10 years: no If all of the above answers are "NO", then may proceed with Cephalosporin use. Other Reaction: OTHER REACTION   Valsartan Itching, Other (See Comments)   Atorvastatin Diarrhea   Carvedilol Other (See Comments)   "teeth hurt when I took it"   Ezetimibe Nausea Only   Fluvastatin Sodium Other (See Comments)   "Can't remember"   Magnesium Hydroxide Other (See Comments)   REACTION: triggers HAs   Meloxicam Other (See Comments)   Pt seeing auro's / spots.     Pneumovax [pneumococcal Polysaccharide Vaccine] Other (See Comments)   Local reaction   Quinapril Hcl Other (See Comments)    "feelings of tiredness"   Simvastatin Diarrhea   Topamax [topiramate] Itching   Vit D-vit E-safflower Oil Other (See Comments)   Headaches        Medication List        Accurate as of October 26, 2020 11:59 PM. If you have any questions, ask your nurse or doctor.          alendronate 70 MG tablet Commonly known as: FOSAMAX Take 1 tablet (70 mg total) by mouth every 7 (seven) days. Take with a full glass of water on an empty stomach.   aspirin 81 MG EC tablet Take 1 tablet (81 mg total) by mouth daily. Swallow whole.   bismuth subsalicylate 470 JG/28ZM suspension Commonly known as: PEPTO BISMOL Take 30 mLs by mouth every 4 (four) hours as needed for indigestion (heartburn).   celecoxib 100 MG capsule Commonly known as: CELEBREX Take 1 capsule (100 mg total) by mouth 2 (two) times  daily. Started by: Kathlene November, MD   furosemide 20 MG tablet Commonly known as: Lasix Take 1 tablet (20 mg total) by mouth daily. What changed:  when to take this reasons to take this   HYDROcodone-acetaminophen 5-325 MG tablet Commonly known as: NORCO/VICODIN Take 1 tablet by mouth 3 (three) times daily as needed for severe pain.   hydroxyurea 500 MG capsule Commonly known as: HYDREA TAKE 1 CAPSULE BY MOUTH EVERY DAY. MAY TAKE WITH FOOD TO MINIMIZE GI SIDE EFFECTS   lactose free nutrition Liqd Take 237 mLs by mouth daily.   Lidocaine 4 % Ptch LIDOCAINE 4% PATCH :Apply 1  patch topically daily and remove after 12 hours to left lateral chest What changed:  how much to take how to take this when to take this additional instructions   LORazepam 0.5 MG tablet Commonly known as: ATIVAN 1/2 tab in AM, 1/2 tab PM, 1/2 to 1 tab hs prn   metoprolol tartrate 50 MG tablet Commonly known as: LOPRESSOR Take 1 tablet (50 mg total) by mouth 2 (two) times daily.   nitroGLYCERIN 0.4 MG SL tablet Commonly known as: NITROSTAT Place 1 tablet (0.4 mg total) under the tongue every 5 (five) minutes x 3 doses as needed for chest pain.   omeprazole 20 MG capsule Commonly known as: PRILOSEC Take 1 capsule (20 mg total) by mouth daily.   polyethylene glycol 17 g packet Commonly known as: MIRALAX / GLYCOLAX Take 17 g by mouth daily.   senna-docusate 8.6-50 MG tablet Commonly known as: Senokot-S Take 1 tablet by mouth 2 (two) times daily.           Objective:   Physical Exam BP 134/84 (BP Location: Left Arm, Patient Position: Sitting, Cuff Size: Small)   Pulse 72   Temp 98.1 F (36.7 C) (Oral)   Resp 18   Ht 5\' 6"  (1.676 m)   SpO2 95%   BMI 18.24 kg/m  General:   Well developed, sitting in wheelchair, has lost weight, she declined to be weighed today.  Looks frail but nontoxic and in no distress. HEENT:  Normocephalic . Face symmetric, atraumatic Lungs:  Decreased breath  sounds Normal respiratory effort, no intercostal retractions, no accessory muscle use. Heart: RRR,  no murmur. Abdomen: Soft, not distended Lower extremities: no pretibial edema bilaterally MSK: Not particularly TTP of the spine or SI joints. L hip: Rotation okay R hip: Passive rotation elicited pain. Skin: Not pale. Not jaundice Neurologic:  alert & oriented X3.  Speech normal, gait not tested. Psych--  Cognition and judgment appear intact.  Cooperative with normal attention span and concentration.  Behavior appropriate. No anxious or depressed appearing.      Assessment     Assessment HTN Dyslipidemia: Intolerant to most medicines including Zetia, Pravachol, Niaspan Depression, anxiety , insomnia - on ativan bid (03-2015 citalopram cause drowsiness, 04-2015 prozac:diarrhea) Hem-Onc: POLYCYTEMIA: sees hem-onc H/o breast ca MSK: ---DJD  ---Osteoporosis:  T score -2.8 (02-2013): declined treatment 06-2017 ---L2 fractures, s/p vertebral plasty 2009, s/p vertebral plasty T 8 07/2020 Migraine headaches - Tylenol #3 (rarely) GI: GERD- intolerant to prevacid, visual disturbances HH, diverticulitis, Colon polyps h/o "functional " LLQ abd pain CV: ---Cardiomyopathy Probable Takotsubo cardiomyopathy: CP w/ normal cath in 1994.   Severe CP  2003 with widespread T wave inversions on ECG.  Cath with minimal coronary disease but LV-gram showed periapical severe hypokinesis and basilar hyperkinesia (EF 40%) >>> ECHO 4/09 confirmed full LV functional recovery with EF 60%, mild LVH. 2012: Nl EF per echo, (-) Lexiscan 05/21/18 ECHO Nl/stable ---Stroke / TIA 2012 ---ICB seizure 10/2019 was rx keppra, self d/c 05/2019 Sleep apnea 2012, , not on CPAP H/oDVT, right leg after venous ablation Raynaud phenomena, DX 05/2018   PLAN Back pain-chronic, R hip pain: Her main concern today is pain management, currently not well controlled, she has a long history of DJD, vertebral fractures, L hip  fracture, etc. Currently on hydrocodone 3 times daily, lidocaine patch ("helps sometimes"). Also got Toradol shots a few days ago with some relief. We had a long discussion about her problem, explaining the risk of excessive narcotics >>>  could  lead to somnolence and potentially falls. Explained that the goal is not 100% pain reduction but bring pain down to a point she can tolerate it. Plan: X-rays lumbar spine and R hip. Continue hydrocodone, lidocaine patches, add a low-dose of Celebrex with GI precautions. Keep appointment to see Dr. Alvan Dame tomorrow, orthopedics.  Consider pain management referral. Recent UTI: Subsequent urine culture was negative Weight loss: She would not let us weigh her today but on inspection she is losing weight, with talk about diet.  Denies fever chills.  Today I spent 40 minutes with the patient, we have a long discussion about her chronic pa she explained how it is affecting her life,  listening therapy provided, chart was reviewed. I tried to set realistic expectations, I do not believe she we will be able to eliminate the pain 100%.  This visit occurred during the SARS-CoV-2 public health emergency.  Safety protocols were in place, including screening questions prior to the visit, additional usage of staff PPE, and extensive cleaning of exam room while observing appropriate contact time as indicated for disinfecting solutions.

## 2020-10-27 DIAGNOSIS — M545 Low back pain, unspecified: Secondary | ICD-10-CM | POA: Diagnosis not present

## 2020-10-27 DIAGNOSIS — S72142D Displaced intertrochanteric fracture of left femur, subsequent encounter for closed fracture with routine healing: Secondary | ICD-10-CM | POA: Diagnosis not present

## 2020-10-27 DIAGNOSIS — M4856XA Collapsed vertebra, not elsewhere classified, lumbar region, initial encounter for fracture: Secondary | ICD-10-CM | POA: Diagnosis not present

## 2020-10-27 DIAGNOSIS — M25552 Pain in left hip: Secondary | ICD-10-CM | POA: Diagnosis not present

## 2020-10-27 NOTE — Assessment & Plan Note (Signed)
Back pain-chronic, R hip pain: Her main concern today is pain management, currently not well controlled, she has a long history of DJD, vertebral fractures, L hip fracture, etc. Currently on hydrocodone 3 times daily, lidocaine patch ("helps sometimes"). Also got Toradol shots a few days ago with some relief. We had a long discussion about her problem, explaining the risk of excessive narcotics >>> could  lead to somnolence and potentially falls. Explained that the goal is not 100% pain reduction but bring pain down to a point she can tolerate it. Plan: X-rays lumbar spine and R hip. Continue hydrocodone, lidocaine patches, add a low-dose of Celebrex with GI precautions. Keep appointment to see Dr. Alvan Dame tomorrow, orthopedics.  Consider pain management referral. Recent UTI: Subsequent urine culture was negative Weight loss: She would not let us weigh her today but on inspection she is losing weight, with talk about diet.  Denies fever chills.

## 2020-10-29 DIAGNOSIS — M545 Low back pain, unspecified: Secondary | ICD-10-CM | POA: Diagnosis not present

## 2020-10-31 ENCOUNTER — Telehealth: Payer: Self-pay | Admitting: Internal Medicine

## 2020-10-31 DIAGNOSIS — M4854XA Collapsed vertebra, not elsewhere classified, thoracic region, initial encounter for fracture: Secondary | ICD-10-CM | POA: Diagnosis not present

## 2020-10-31 DIAGNOSIS — M4856XA Collapsed vertebra, not elsewhere classified, lumbar region, initial encounter for fracture: Secondary | ICD-10-CM | POA: Diagnosis not present

## 2020-10-31 NOTE — Telephone Encounter (Signed)
Surgical clearance form given to PCP.

## 2020-10-31 NOTE — Telephone Encounter (Signed)
Patients care giver dropped off form to be filled out & faxed to the number on sheet [phone]  Placed into paz bin up frontn

## 2020-11-01 ENCOUNTER — Other Ambulatory Visit: Payer: Self-pay | Admitting: Family

## 2020-11-01 DIAGNOSIS — D751 Secondary polycythemia: Secondary | ICD-10-CM

## 2020-11-01 NOTE — Telephone Encounter (Signed)
Patient in need of a kyphoplasty with IV sedation and local anesthesia. Pain is a major issue for the patient, deteriorating her QOL. She is  clear from my standpoint but also needs cardiology clearance.

## 2020-11-01 NOTE — Telephone Encounter (Signed)
Form faxed to Orson Slick at [employer] [phone]). Form sent for scanning.

## 2020-11-01 NOTE — Telephone Encounter (Signed)
In PCP red folder for completion.

## 2020-11-01 NOTE — Telephone Encounter (Signed)
Received fax confirmation

## 2020-11-02 ENCOUNTER — Telehealth: Payer: Self-pay

## 2020-11-02 NOTE — Telephone Encounter (Signed)
   [place] Pre-operative Risk Assessment    Patient Name: [name]  DOB: [birth year]  MRN: [id number]   HEARTCARE STAFF: - Please ensure there is not already an duplicate clearance open for this procedure. - Under Visit Info/Reason for Call, type in Other and utilize the format Clearance MM/DD/YY or Clearance TBD. Do not use dashes or single digits. - If request is for dental extraction, please clarify the # of teeth to be extracted. - If the patient is currently at the dentist's office, call Pre-Op APP to address. If the patient is not currently in the dentist office, please route to the Pre-Op pool  Request for surgical clearance:  What type of surgery is being performed? T11 L1 Kyphoplasty    When is this surgery scheduled? TBD   What type of clearance is required (medical clearance vs. Pharmacy clearance to hold med vs. Both)? Medical  Are there any medications that need to be held prior to surgery and how long?   Practice name and name of physician performing surgery? [facility]- [name]   What is the office phone number? [phone]   7.   What is the office fax number? [phone]  8.   Anesthesia type (None, local, MAC, general) ? IV sedation/Local   [name] 11/02/2020, 4:47 PM  _________________________________________________________________   (provider comments below)

## 2020-11-03 NOTE — Telephone Encounter (Signed)
Dr. Bettina Gavia, This patient has a known history of nonobstructive CAD, but no recent ischemic evaluation. You also listed a hx of takotsubo CM wit subsequent functional recovery. You last saw her 08/10/20. She is not able to complete 4.0 METS. She denies recent angina. Prior to her fall last year, she was able to swim and go to the gym twice weekly. She is now unable to walk by herself, uses a walker, due to back problems. Cardiac clearance has been requested for kyphoplasty. They will use IV sedation and local anesthesia.   Is she cleared for surgery?

## 2020-11-07 ENCOUNTER — Other Ambulatory Visit: Payer: Self-pay | Admitting: Internal Medicine

## 2020-11-07 ENCOUNTER — Telehealth: Payer: Self-pay | Admitting: Internal Medicine

## 2020-11-07 MED ORDER — HYDROCODONE-ACETAMINOPHEN 5-325 MG PO TABS: 1.0000 | ORAL_TABLET | Freq: Three times a day (TID) | ORAL | 0 refills | Status: DC | PRN

## 2020-11-07 NOTE — Telephone Encounter (Signed)
Requesting: hydrocodone 5-325mg  Contract: 04/22/2020 UDS: 06/29/2019 Last Visit: 10/26/2020 Next Visit: 11/21/2020 Last Refill: 10/18/2020 #45 and 0RF Pt sig: 1 tab tid prn  Please Advise

## 2020-11-07 NOTE — Telephone Encounter (Signed)
PDMP okay, Rx sent 

## 2020-11-07 NOTE — Telephone Encounter (Signed)
Pt caregiver states pharmacy informed her to call Dr. Larose Kells for a new rx for her hydocodone, [facility] [address] [place]

## 2020-11-07 NOTE — Telephone Encounter (Signed)
   Name: [name]  DOB: [birth year]  MRN: [id number]   Primary Cardiologist: Shirlee More, MD  Chart reviewed as part of pre-operative protocol coverage.   Per Dr. Bettina Gavia, she is optimized for back surgery.   No further cardiovascular testing planned. She is at acceptable risk to proceed.   I will route this recommendation to the requesting party via Epic fax function and remove from pre-op pool. Please call with questions.  Tami Lin Rainey Kahrs, PA 11/07/2020, 8:19 AM

## 2020-11-08 ENCOUNTER — Ambulatory Visit: Payer: Self-pay | Admitting: Orthopedic Surgery

## 2020-11-11 ENCOUNTER — Encounter (HOSPITAL_COMMUNITY): Payer: Self-pay | Admitting: Orthopedic Surgery

## 2020-11-11 ENCOUNTER — Telehealth: Payer: Self-pay

## 2020-11-11 ENCOUNTER — Other Ambulatory Visit: Payer: Self-pay

## 2020-11-11 ENCOUNTER — Ambulatory Visit: Payer: Self-pay | Admitting: Orthopedic Surgery

## 2020-11-11 NOTE — Progress Notes (Signed)
Spoke with pt's daughter, [name] for pre-op call. I attempted to call pt x 2 but no answer and no voicemail available. Pt sees Dr. Bettina Gavia for mild CAD. Daughter states pt is not diabetic.   [name] is concerned that pt isn't scheduled to be admitted after surgery. She states her mother tends not to be compliant with her meds and PT after surgery. I instructed [name] to call Dr. Rolena Infante' office on Tuesday and talk with them about this matter. [name] stated that for the last few surgeries that her mother had like this one, her mother went to rehab.   I did explain that if her mother is going to be admitted, that she will need a Covid test prior to surgery. She voiced understanding.

## 2020-11-11 NOTE — Telephone Encounter (Signed)
Form received-given to PCP.

## 2020-11-11 NOTE — H&P (Deleted)
  The note originally documented on this encounter has been moved the the encounter in which it belongs.  

## 2020-11-11 NOTE — Telephone Encounter (Signed)
[facility] called stating they need a new surgical clearance form signed since patient has been cleared by cardio. New form is being faxed over.

## 2020-11-11 NOTE — H&P (Signed)
Subjective:   [name] is a pleasant 87 year old female with known osteoporosis (On alendronate). History of T9,T 10 and L2 compression fxs w/ Kyphoplasty done by IR. New T11 and L1 compression fractures. Patient is currently enrolled in pain management and taking hydrocodone. Denies radicular leg pain, lower extremity weakness, or changes in her bladder or bowel control. Patient is here today with a caregiver. We have discussed conservative care versus kyphoplasty procedure and after discussing risks of surgery, benefits, and alternative treatments patient has elected to move forward with a kyphoplasty procedure. She is scheduled for T-11, L1 Kypho At [facility] On 11/16/20 With Dr. Rolena Infante.  Patient Active Problem List   Diagnosis Date Noted   UTI (urinary tract infection) 10/03/2020   Generalized weakness 10/03/2020   Otalgia of both ears    Hypertension    GERD (gastroesophageal reflux disease)    Dyspnea    DVT (deep venous thrombosis) (HCC)    Diverticulitis    Cancer (HCC)    CAD (coronary artery disease)    Acute midline thoracic back pain 03/04/2020   Malnutrition of moderate degree 12/27/2019   Pressure injury of skin 12/27/2019   S/p left hip fracture    Comminuted fracture of hip, left, open type I or II, initial encounter ([place]) 12/24/2019   Closed left hip fracture ([place]) 12/24/2019   ICH (intracerebral hemorrhage) ([place]) 11/04/2019   Intractable back pain 09/13/2019   Compression fracture of T9 vertebra ([place]) 09/13/2019   Chronic diastolic CHF (congestive heart failure) ([place]) 09/13/2019   Overweight (BMI 25.0-29.9) 09/13/2019   Constipation 09/13/2019   Raynaud's phenomenon without gangrene 05/19/2018   Iron deficiency anemia due to chronic blood loss 12/30/2017   Insomnia 06/25/2017   Mild CAD 05/24/2017   PCP NOTES >>> 02/15/2015   Superficial thrombophlebitis 04/06/2014   Annual physical exam 08/18/2012   Edema 04/14/2012   Breast cancer ([place]) 11/05/2011   Polycythemia  11/05/2011   Stroke ([place]) 03/29/2011   OSA (obstructive sleep apnea) 03/29/2011   OA (osteoarthritis) of knee 03/29/2011   Hiatal hernia 03/29/2011   E. coli gastroenteritis 03/29/2011   Depression with anxiety 03/29/2011   Chest pain 01/16/2011   TIA (transient ischemic attack) 01/16/2011   CARDIOMYOPATHY 08/03/2009   DJD -- pain mgmt 08/16/2008   ABDOMINAL PAIN, LEFT LOWER QUADRANT 07/05/2008   GAIT DISTURBANCE 10/21/2007   ANXIETY DEPRESSION 10/08/2007   Fracture 09/10/2007   Hyperlipidemia 06/25/2007   Takotsubo cardiomyopathy 06/25/2007   GERD without esophagitis 06/25/2007   Essential hypertension 09/30/2006   Senile osteoporosis 09/30/2006   Migraine with aura 09/30/2006   Hyperplastic colon polyp 06/2001   Past Medical History:  Diagnosis Date   CAD (coronary artery disease)    mild nonobstructive disease on cath in 2003   Cancer [facility])    Cardiomyopathy    Probable Takotsubo, severe CP w/ normal cath in 1994. Severe CP in 2003 w/ widespread T wave inversions on ECG. Cath w/ minimal coronary disease but LV-gram showed periapical severe hypokinesis and basilar hyperkinesia (EF 40%). Last echo in 4/09 confirmed full LV functional recovery with EF 60%, no regional wall motion abnormalities, mild to moderate LVH.   Chronic diastolic CHF (congestive heart failure) ([place]) 09/13/2019   CVA (cerebral infarction)    Depression with anxiety 03-29-11   lost husband 3'09   Diverticulitis    DVT (deep venous thrombosis) ([place])    after venous ablation, R leg   Dyspnea    E. coli gastroenteritis 03-29-11   8'10  Fracture 09/10/07   L2, status post vertebroplasty of L2 performed by IR   GERD (gastroesophageal reflux disease)    Headache(784.0)    migraines   Hemorrhoids    Hiatal hernia 03-29-11   no nerve problems   Hyperlipidemia    Hyperplastic colon polyp 06/2001   Hypertension    Iron deficiency anemia due to chronic blood loss 12/30/2017   OA (osteoarthritis) of knee  03-29-11   w/ bilateral knee pain-not a problem now   OSA (obstructive sleep apnea) 03-29-11   no cpap used, not a problem now.   Osteoporosis    Otalgia of both ears    Dr. Simeon Craft   Polycythemia    Stroke [facility]) 03-29-11   CVA x2 -last 10'12-?TIA(visual problems)   Varicose vein     Past Surgical History:  Procedure Laterality Date   APPENDECTOMY     BACK SURGERY     BREAST BIOPSY     BREAST CYST EXCISION     BREAST LUMPECTOMY Left 2013   left stage I left breast cancer   CHOLECYSTECTOMY  04/09/2011   Procedure: LAPAROSCOPIC CHOLECYSTECTOMY WITH INTRAOPERATIVE CHOLANGIOGRAM;  Surgeon: Odis Hollingshead, MD;  Location: [facility];  Service: General;  Laterality: N/A;   Dental Extraction     L maxillary molar   INTRAMEDULLARY (IM) NAIL INTERTROCHANTERIC Left 12/25/2019   Procedure: INTRAMEDULLARY (IM) NAIL INTERTROCHANTRIC;  Surgeon: Paralee Cancel, MD;  Location: [facility];  Service: Orthopedics;  Laterality: Left;   IR KYPHO THORACIC WITH BONE BIOPSY  09/15/2019   IR KYPHO THORACIC WITH BONE BIOPSY  07/13/2020       IR KYPHO THORACIC WITH BONE BIOPSY  07/13/2020   IR VERTEBROPLASTY CERV/THOR BX INC UNI/BIL INC/INJECT/IMAGING  11/10/2019   KYPHOSIS SURGERY  08/2007   cement used   OVARIAN CYST SURGERY     left   RADIOLOGY WITH ANESTHESIA N/A 11/10/2019   Procedure: VERTEBROPLASTY;  Surgeon: Luanne Bras, MD;  Location: [place];  Service: Radiology;  Laterality: N/A;   TONSILLECTOMY     TOTAL KNEE ARTHROPLASTY  06/2010   left   TUBAL LIGATION      Current Outpatient Medications  Medication Sig Dispense Refill Last Dose   alendronate (FOSAMAX) 70 MG tablet Take 1 tablet (70 mg total) by mouth every 7 (seven) days. Take with a full glass of water on an empty stomach. 12 tablet 3    aspirin 81 MG EC tablet Take 1 tablet (81 mg total) by mouth daily. Swallow whole.      bismuth subsalicylate (PEPTO BISMOL) 262 MG/15ML suspension Take 30 mLs by mouth every 4 (four) hours as needed for  indigestion (heartburn).       calcium carbonate (TUMS - DOSED IN MG ELEMENTAL CALCIUM) 500 MG chewable tablet Chew 1-2 tablets by mouth daily as needed for indigestion or heartburn.      celecoxib (CELEBREX) 100 MG capsule Take 1 capsule (100 mg total) by mouth 2 (two) times daily. 60 capsule 1    furosemide (LASIX) 20 MG tablet Take 1 tablet (20 mg total) by mouth daily. (Patient taking differently: Take 20 mg by mouth daily as needed for fluid or edema.) 30 tablet 3    HYDROcodone-acetaminophen (NORCO/VICODIN) 5-325 MG tablet Take 1 tablet by mouth 3 (three) times daily as needed for severe pain. 45 tablet 0    hydroxyurea (HYDREA) 500 MG capsule TAKE 1 CAPSULE BY MOUTH EVERY DAY. MAY TAKE WITH FOOD TO MINIMIZE GI SIDE EFFECTS (Patient taking differently:  Take 500 mg by mouth daily.) 15 capsule 0    lactose free nutrition (BOOST) LIQD Take 237 mLs by mouth daily.      Lidocaine 4 % PTCH LIDOCAINE 4% PATCH :Apply 1 patch topically daily and remove after 12 hours to left lateral chest (Patient not taking: Reported on 11/08/2020) 5 patch 0    LORazepam (ATIVAN) 0.5 MG tablet 1/2 tab in AM, 1/2 tab PM, 1/2 to 1 tab hs prn (Patient taking differently: Take 0.5 mg by mouth in the morning, at noon, and at bedtime.) 90 tablet 1    metoprolol tartrate (LOPRESSOR) 50 MG tablet Take 1 tablet (50 mg total) by mouth 2 (two) times daily. 180 tablet 1    nitroGLYCERIN (NITROSTAT) 0.4 MG SL tablet Place 1 tablet (0.4 mg total) under the tongue every 5 (five) minutes x 3 doses as needed for chest pain. 25 tablet 3    omeprazole (PRILOSEC) 20 MG capsule Take 1 capsule (20 mg total) by mouth daily. (Patient taking differently: Take 20 mg by mouth daily as needed (acid reflux).) 30 capsule 0    polyethylene glycol (MIRALAX / GLYCOLAX) 17 g packet Take 17 g by mouth daily as needed for moderate constipation.      senna-docusate (SENOKOT-S) 8.6-50 MG tablet Take 1 tablet by mouth 2 (two) times daily. (Patient taking  differently: Take 1 tablet by mouth daily as needed for moderate constipation.)      vitamin C (ASCORBIC ACID) 500 MG tablet Take 500 mg by mouth daily.      No current facility-administered medications for this visit.   Allergies  Allergen Reactions   Penicillins Hives, Rash and Other (See Comments)    Has patient had a PCN reaction causing immediate rash, facial/tongue/throat swelling, SOB or lightheadedness with hypotension: Yes Has patient had a PCN reaction causing severe rash involving mucus membranes or skin necrosis: yes Has patient had a PCN reaction that required hospitalization: No Has patient had a PCN reaction occurring within the last 10 years: no If all of the above answers are "NO", then may proceed with Cephalosporin use.  Other Reaction: OTHER REACTION   Valsartan Itching and Other (See Comments)   Atorvastatin Diarrhea   Carvedilol Other (See Comments)    "teeth hurt when I took it"    Ezetimibe Nausea Only   Fluvastatin Sodium Other (See Comments)    "Can't remember"   Magnesium Hydroxide Other (See Comments)    REACTION: triggers HAs   Meloxicam Other (See Comments)    Pt seeing auro's / spots.     Pneumovax [Pneumococcal Polysaccharide Vaccine] Other (See Comments)    Local reaction   Quinapril Hcl Other (See Comments)     "feelings of tiredness"   Simvastatin Diarrhea   Topamax [Topiramate] Itching   Vit D-Vit E-Safflower Oil Other (See Comments)    Headaches    Social History   Tobacco Use   Smoking status: Former    Packs/day: 1.00    Years: 37.00    Pack years: 37.00    Types: Cigarettes    Start date: 06/26/1949    Quit date: 05/14/1988    Years since quitting: 32.5   Smokeless tobacco: Never   Tobacco comments:    quit 25 years ago  Substance Use Topics   Alcohol use: Yes    Alcohol/week: 1.0 standard drink    Types: 1 Glasses of wine per week    Comment: occasional/social    Family History  Problem Relation  Age of Onset   Breast  cancer Mother    Heart disease Father        MIs   Colon cancer Other        cousin   Breast cancer Sister    Leukemia Brother        GM    Review of Systems Pertinent items are noted in HPI.  Objective:   Vitals: BP: 108/66 11/11/2020 02:03 pm Pulse: 62 bpm regular 11/11/2020 02:04 pm   General: AAOX3, well developed and well nourished, NAD Ambulation: antalgic gait pattern, uses Wheelchair assistive device. Inspection: No obvious deformity Heart: Regular rate and rhythm, no rubs, murmurs, or gallops Lungs: Clear auscultation bilaterally Abdomen: Bowel sounds 4, nondistended, nontender, no rebound tenderness. Palpation: Tender over spinous processes In the lower thoracic and upper lumbar region. AROM: - Significant pain with range of motion of lumbar spine - Knee: flexion and extension normal and pain free bilaterally. - Ankle: Dorsiflexion, plantarflexion, inversion, eversion normal and pain free. Dermatomes: Lower extremity sensation to light touch intact bilaterally Myotomes: - 5/5 lower external he motor strength bilaterally Reflexes: - Clonus: Negative Special Tests: - Straight Leg Raise: Left Negative, Right Negative PV: Extremities warm and well profused. Posterior and dorsalis pedis pulse 2+ bilaterally, No pitting Edema, discoloration. MRI Impression: MRI of lumbar spine performed at [facility] dated 10/30/2020. The images as well as the report were personally reviewed by me. MRI demonstrates a T11 acute/subacute anterior superior endplate wedge fracture deformity proximally 50% height loss and minimal posterior superior cortical retropulsion without any significant stenosis. There is also an L1 subacute chronic superior endplate compressive deformity with mild 5-10% loss of the anterior and middle column height with minimal retropulsion without canal stenosis. There is a mottled marrow signal with diminished fatty marrow for patient's age benign etiology such  as osteoporosis is favored  Assessment:   [name] is a pleasant 87 year old female with known osteoporosis, on alendronate who is had previous T9, T10, L2 compression fractures which were treated with a kyphoplasty procedure. She is experiencing new lower thoracic/lumbar pain which correlates with an acute/subacute T11 compression fracture and a subacute to chronic L1 compression fracture. I do believe these new compression fractures at the cause of the patient's pain.   Plan:   We discussed conservative care including treatment of the pain with oral medications, repeat x-rays once a month to assess for fracture healing, and then starting some formalized physical therapy once the fractures have had adequate time to heal versus a kyphoplasty procedure.Risks of surgery include: Infection, bleeding, death, stroke, paralysis, nerve damage, leak of cement, need for additional surgery including open decompression. Ongoing or worse pain. Goals of surgery: Reduction in pain, and improvement in quality of life. We discussed that the benefit of the kyphoplasty procedure would be to decrease the pain in a more timely manner to help improve the patient's ambulation which is currently limited due to pain as well as help decrease the amount of narcotic pain medication she is taking which may increase her risk for other complications. After discussing risks, benefits, and alternative treatment options the patient has elected to proceed with a kyphoplasty procedure. She was given a pamphlet about the kyphoplasty procedure.  We have obtained preoperative medical clearance from the patient's primary care recommending a cardiology clearance as well. We also have clearance from cardiology.  Patient takes 81 mg of aspirin daily. She has started holding this and will continue to hold it leading up to surgery. Plan  to resume 48 hours after surgery. Not taking any other NSAIDs. Not on blood thinners.  We have also discussed  the post-operative recovery period to include: bathing/showering restrictions, wound healing, activity (and driving) restrictions, medications/pain mangement.  We have also discussed post-operative redflags to include: signs and symptoms of postoperative infection, DVT/PE.  Discharge instructions were reviewed with the patient at today's office visit. All questions invited and answered.  Follow-up: 2 weeks postop

## 2020-11-15 NOTE — Anesthesia Preprocedure Evaluation (Addendum)
Anesthesia Evaluation  Patient identified by MRN, date of birth, ID band Patient awake    Reviewed: Allergy & Precautions, NPO status , Patient's Chart, lab work & pertinent test results  Airway Mallampati: II  TM Distance: >3 FB Neck ROM: Full    Dental  (+) Lower Dentures, Upper Dentures   Pulmonary shortness of breath, sleep apnea , Patient abstained from smoking., former smoker,    Pulmonary exam normal breath sounds clear to auscultation       Cardiovascular hypertension, Pt. on medications + CAD (mild non-obstructive)  Normal cardiovascular exam Rhythm:Regular Rate:Normal  Most recent EKG sinus brady  05/21/2018 ECHO:  - Left ventricle: The cavity size was normal. Wall thickness was  normal. Systolic function was normal. The estimated ejection  fraction was in the range of 60% to 65%. Wall motion was normal;  there were no regional wall motion abnormalities. Doppler  parameters are consistent with abnormal left ventricular  relaxation (grade 1 diastolic dysfunction).  - Mitral valve: There was mild regurgitation.  - Left atrium: The atrium was moderately dilated.    Neuro/Psych  Headaches, PSYCHIATRIC DISORDERS Anxiety Depression TIACVA    GI/Hepatic GERD  ,diverticulitis   Endo/Other    Renal/GU      Musculoskeletal  (+) Arthritis , Osteoarthritis,    Abdominal Normal abdominal exam  (+)   Peds  Hematology negative hematology ROS (+) polycythemia   Anesthesia Other Findings   Reproductive/Obstetrics                           Anesthesia Physical Anesthesia Plan  ASA: 3  Anesthesia Plan: MAC   Post-op Pain Management:    Induction: Intravenous  PONV Risk Score and Plan: 2 and Propofol infusion, TIVA, Treatment may vary due to age or medical condition and Ondansetron  Airway Management Planned: Natural Airway and Simple Face Mask  Additional Equipment:  None  Intra-op Plan:   Post-operative Plan:   Informed Consent: I have reviewed the patients History and Physical, chart, labs and discussed the procedure including the risks, benefits and alternatives for the proposed anesthesia with the patient or authorized representative who has indicated his/her understanding and acceptance.     Dental advisory given  Plan Discussed with: CRNA, Anesthesiologist and Surgeon  Anesthesia Plan Comments: (PAT note written 11/15/2020 by Myra Gianotti, PA-C. )      Anesthesia Quick Evaluation

## 2020-11-15 NOTE — Progress Notes (Signed)
Anesthesia Chart Review: SAME DAY WORK-UP  Case: [id number] Date/Time: 11/16/20 1330   Procedure: T11, L1 KYPHOPLASTY - 18min   Anesthesia type: Local   Pre-op diagnosis: T11,L1 compression fracture   Location: [facility] OR ROOM 04 / [place] OR   Surgeons: Melina Schools, MD       DISCUSSION: Patient is an 87 year old female scheduled for the above procedure.  History includes former smoker (quit 05/14/88), CVA, cardiomyopathy (likely Takotsubo;  1994 "normal" cath in 1994; 07/14/01 LVEF 40%, antral-apical akinesia with mild CAD, likely sudden closure with spontaneous opening LAD; LVEF 60-65% 05/21/18), CAD (mild 2003), chronic diastolic CHF, dyspnea, HTN, GERD, hiatal hernia, anemia, polycythemia vera (JAK2 positive), CVA/TIA (old thalamic/? Left basal ganglia infarcts 07/05/06 CT; ? TIA 01/2011), left breast cancer (s/p left breast lumpectomy 11/22/11), OSA (does not use CPAP), RLE DVT (~ 2007, post Right GSV laser ablation), vertebroplasty/kyphoplasty (L2 09/10/07; T9 09/15/19; T4 11/10/19, T8 07/13/20), left intertrochanteric femur fracture (s/p ORIF with IM nailing 12/25/19), fall (tripped on curb, sustained ICH-SDH & SAH with associated post-traumatic seizure 11/04/19, evaluated by Randell Loop, MD, managed conservatively)   - Preoperative cardiology input outlined on 11/07/20 by Fabian Sharp, PA, "Per Dr. Bettina Gavia, she is optimized for back surgery.   No further cardiovascular testing planned. She is at acceptable risk to proceed."    - Per 11/15/20 notation by PCP Dr. Larose Kells, "Since cardiology cleared her, is okay to proceed."   She is a same day work-up. Anesthesia team to evaluate on the day of surgery.   VS:  BP Readings from Last 3 Encounters:  10/26/20 134/84  10/19/20 (!) 160/78  10/06/20 (!) 142/76   Pulse Readings from Last 3 Encounters:  10/26/20 72  10/19/20 66  10/06/20 67     PROVIDERS: Colon Branch, MD is PCP  - Shirlee More, MD is cardiologist. Last visit 08/10/20 for chest pain  episode with predominant complaint of "heartburn indigestion at times food sticks and she is unable to swallow. She is not having typical angina edema shortness of breath palpitation or syncope." She had Nitro to use as needed and was not inclined to pursue ischemic evaluation such as LHC. Last cath 2003 with mild, non-obstructive CAD. No new testing ordered with plan for 3 month follow-up. Burney Gauze, MD is HEM-ONC. Last visit 10/19/20 with [name], NP. Current therapy for polycythemia: Phlebotomy to maintain hematocrit below 45%, Hydrea 500 mg daily, IV iron as needed. HCT 47%, but patient preferred to wait for next visit to consider phlebotomy given her significant back pain related to compression fractures.     LABS: For day of surgery as indicated. As of 10/19/20, H/H 15.7/47.4%, PLT 315. Cr 0.43 and glucose 116 on 10/05/20.    IMAGES: Xray L-spine 10/26/20: IMPRESSION: Osteoporosis. Previous kyphoplasty procedures at T9, T10, and L2. There is moderately severe anterior wedging at T11 with milder anterior wedging at L1, not present on prior studies. No appreciable spondylolisthesis. Moderate disc space narrowing noted at L4-5. Facet osteoarthritic change at L4-5 and L5-S1 bilaterally. - Aortic Atherosclerosis (ICD10-I70.0).  CT Head 10/03/20: IMPRESSION: 1. No acute intracranial abnormality. 2. Stable atrophy, chronic small vessel ischemia, and remote infarcts.  CT T/L Spine 07/04/20: IMPRESSION: 1. New compression fracture of T9 with approximately 50% height loss and 4 mm of retropulsion. Mild spinal canal narrowing. 2. Unchanged appearance of augmented compression fractures at T5 and T10. 3. Prior vertebral augmentation at L2 without progressive height loss. 4. Unchanged chronic height loss at L3  and L4. - Aortic Atherosclerosis (ICD10-I70.0).   CXR 03/04/20: IMPRESSION: 1. No acute cardiopulmonary findings. 2. New severe compression fracture of T8. 3. Suspect  nondisplaced lateral right sixth rib fracture, age indeterminate. 4. Numerous lateral left-sided rib fractures. These were present on the previous study 11/11/2019.   EKG: 08/10/20 SB at 59 bpm. Septal infarct (age undetermined).    CV: Carotid US 07/22/18: Summary:  - Right Carotid: Velocities in the right ICA are consistent with a 1-39% stenosis.  - Left Carotid: Velocities in the left ICA are consistent with a 1-39% stenosis.  - Vertebrals:  Bilateral vertebral arteries demonstrate antegrade flow.  - Subclavians: Normal flow hemodynamics were seen in bilateral subclavian arteries.    Echo 05/21/18: Study Conclusions  - Left ventricle: The cavity size was normal. Wall thickness was    normal. Systolic function was normal. The estimated ejection    fraction was in the range of 60% to 65%. Wall motion was normal;    there were no regional wall motion abnormalities. Doppler    parameters are consistent with abnormal left ventricular    relaxation (grade 1 diastolic dysfunction).  - Mitral valve: There was mild regurgitation.  - Left atrium: The atrium was moderately dilated.  Impressions:   1. Left ventricular systolic function is preserved visually    estimated ejection fraction is 60 to 65%. Impaired left    ventricular relaxation.    2. Moderately enlarged left atrium.    3. Mild mitral regurgitation.    4. Aortic sclerosis.    Cardiac cath 07/14/01: FINAL DIAGNOSES: 1. Mild coronary artery disease with 25% distal left main stenosis,    25% ostial circumflex stenosis and 25% proximal right coronary artery    stenosis. 2. Normal antegrade flow and normal distal runoff in all coronary arteries. 3. Severe antral-apical akinesia with hyperdynamic inferior and basilar    contractility. Ejection fraction overall 40-50%. 4. Mild atherosclerosis of the distal abdominal aorta with normal renal    arteries. 5. Successful Perclose of the right femoral artery. DISPOSITION: When review  of all aspects with the marked antral-apical akinesia along with a minor increase in cardiac enzymes and severe anterior chest pain, we have the hope that this represents a sudden closure and spontaneous reopening, and hopefully stunned myocardium which will recover fully...   Past Medical History:  Diagnosis Date   CAD (coronary artery disease)    mild nonobstructive disease on cath in 2003   Cancer [facility])    Cardiomyopathy    Probable Takotsubo, severe CP w/ normal cath in 1994. Severe CP in 2003 w/ widespread T wave inversions on ECG. Cath w/ minimal coronary disease but LV-gram showed periapical severe hypokinesis and basilar hyperkinesia (EF 40%). Last echo in 4/09 confirmed full LV functional recovery with EF 60%, no regional wall motion abnormalities, mild to moderate LVH.   Chronic diastolic CHF (congestive heart failure) ([place]) 09/13/2019   CVA (cerebral infarction)    Depression with anxiety 03-29-11   lost husband 3'09   Diverticulitis    DVT (deep venous thrombosis) ([place])    after venous ablation, R leg   Dyspnea    E. coli gastroenteritis 03-29-11   8'10   Fracture 09/10/07   L2, status post vertebroplasty of L2 performed by IR   GERD (gastroesophageal reflux disease)    Headache(784.0)    migraines   Hemorrhoids    Hiatal hernia 03-29-11   no nerve problems   Hyperlipidemia    Hyperplastic colon polyp 06/2001  Hypertension    Iron deficiency anemia due to chronic blood loss 12/30/2017   OA (osteoarthritis) of knee 03-29-11   w/ bilateral knee pain-not a problem now   OSA (obstructive sleep apnea) 03-29-11   no cpap used, not a problem now.   Osteoporosis    Otalgia of both ears    Dr. Simeon Craft   Polycythemia    Stroke [facility]) 03-29-11   CVA x2 -last 10'12-?TIA(visual problems)   Varicose vein     Past Surgical History:  Procedure Laterality Date   APPENDECTOMY     BACK SURGERY     BREAST BIOPSY     BREAST CYST EXCISION     BREAST LUMPECTOMY Left 2013   left  stage I left breast cancer   CHOLECYSTECTOMY  04/09/2011   Procedure: LAPAROSCOPIC CHOLECYSTECTOMY WITH INTRAOPERATIVE CHOLANGIOGRAM;  Surgeon: Odis Hollingshead, MD;  Location: [facility] ORS;  Service: General;  Laterality: N/A;   Dental Extraction     L maxillary molar   INTRAMEDULLARY (IM) NAIL INTERTROCHANTERIC Left 12/25/2019   Procedure: INTRAMEDULLARY (IM) NAIL INTERTROCHANTRIC;  Surgeon: Paralee Cancel, MD;  Location: [facility] ORS;  Service: Orthopedics;  Laterality: Left;   IR KYPHO THORACIC WITH BONE BIOPSY  09/15/2019   IR KYPHO THORACIC WITH BONE BIOPSY  07/13/2020       IR KYPHO THORACIC WITH BONE BIOPSY  07/13/2020   IR VERTEBROPLASTY CERV/THOR BX INC UNI/BIL INC/INJECT/IMAGING  11/10/2019   KYPHOSIS SURGERY  08/2007   cement used   OVARIAN CYST SURGERY     left   RADIOLOGY WITH ANESTHESIA N/A 11/10/2019   Procedure: VERTEBROPLASTY;  Surgeon: Luanne Bras, MD;  Location: [place];  Service: Radiology;  Laterality: N/A;   TONSILLECTOMY     TOTAL KNEE ARTHROPLASTY  06/2010   left   TUBAL LIGATION      MEDICATIONS: No current facility-administered medications for this encounter.    alendronate (FOSAMAX) 70 MG tablet   aspirin 81 MG EC tablet   bismuth subsalicylate (PEPTO BISMOL) 262 MG/15ML suspension   calcium carbonate (TUMS - DOSED IN MG ELEMENTAL CALCIUM) 500 MG chewable tablet   celecoxib (CELEBREX) 100 MG capsule   furosemide (LASIX) 20 MG tablet   HYDROcodone-acetaminophen (NORCO/VICODIN) 5-325 MG tablet   hydroxyurea (HYDREA) 500 MG capsule   lactose free nutrition (BOOST) LIQD   LORazepam (ATIVAN) 0.5 MG tablet   metoprolol tartrate (LOPRESSOR) 50 MG tablet   nitroGLYCERIN (NITROSTAT) 0.4 MG SL tablet   omeprazole (PRILOSEC) 20 MG capsule   polyethylene glycol (MIRALAX / GLYCOLAX) 17 g packet   senna-docusate (SENOKOT-S) 8.6-50 MG tablet   vitamin C (ASCORBIC ACID) 500 MG tablet   Lidocaine 4 % PTCH   ASA on hold per H&P with plans to resume 48 hours after surgery.     Myra Gianotti, PA-C Surgical Short Stay/Anesthesiology [facility] Phone [phone] [facility] Phone [phone] 11/15/2020 11:43 AM

## 2020-11-15 NOTE — Telephone Encounter (Signed)
Since cardiology cleared her, is okay to proceed.

## 2020-11-15 NOTE — Telephone Encounter (Signed)
Received fax confirmation

## 2020-11-15 NOTE — Telephone Encounter (Signed)
Form faxed back to [facility] Attn: Orson Slick at [phone]. Form sent for scanning.

## 2020-11-16 ENCOUNTER — Observation Stay (HOSPITAL_COMMUNITY)
Admission: RE | Admit: 2020-11-16 | Discharge: 2020-11-17 | Disposition: A | Discharge status: Home / Self Care | Payer: Medicare Other | Attending: Orthopedic Surgery | Admitting: Orthopedic Surgery

## 2020-11-16 ENCOUNTER — Other Ambulatory Visit: Payer: Self-pay

## 2020-11-16 ENCOUNTER — Ambulatory Visit (HOSPITAL_COMMUNITY): Payer: Medicare Other

## 2020-11-16 ENCOUNTER — Ambulatory Visit (HOSPITAL_COMMUNITY): Payer: Medicare Other | Admitting: Vascular Surgery

## 2020-11-16 ENCOUNTER — Encounter (HOSPITAL_COMMUNITY)
Admission: RE | Disposition: A | Discharge status: Home / Self Care | Payer: Self-pay | Source: Home / Self Care | Attending: Orthopedic Surgery

## 2020-11-16 ENCOUNTER — Encounter (HOSPITAL_COMMUNITY): Payer: Self-pay | Admitting: Orthopedic Surgery

## 2020-11-16 DIAGNOSIS — S22080A Wedge compression fracture of T11-T12 vertebra, initial encounter for closed fracture: Secondary | ICD-10-CM | POA: Insufficient documentation

## 2020-11-16 DIAGNOSIS — I5032 Chronic diastolic (congestive) heart failure: Secondary | ICD-10-CM | POA: Insufficient documentation

## 2020-11-16 DIAGNOSIS — I110 Hypertensive heart disease with heart failure: Secondary | ICD-10-CM | POA: Diagnosis not present

## 2020-11-16 DIAGNOSIS — S32000A Wedge compression fracture of unspecified lumbar vertebra, initial encounter for closed fracture: Secondary | ICD-10-CM | POA: Diagnosis present

## 2020-11-16 DIAGNOSIS — R262 Difficulty in walking, not elsewhere classified: Secondary | ICD-10-CM | POA: Insufficient documentation

## 2020-11-16 DIAGNOSIS — D500 Iron deficiency anemia secondary to blood loss (chronic): Secondary | ICD-10-CM | POA: Diagnosis not present

## 2020-11-16 DIAGNOSIS — M8008XA Age-related osteoporosis with current pathological fracture, vertebra(e), initial encounter for fracture: Secondary | ICD-10-CM | POA: Diagnosis not present

## 2020-11-16 DIAGNOSIS — Z859 Personal history of malignant neoplasm, unspecified: Secondary | ICD-10-CM | POA: Diagnosis not present

## 2020-11-16 DIAGNOSIS — Z8673 Personal history of transient ischemic attack (TIA), and cerebral infarction without residual deficits: Secondary | ICD-10-CM | POA: Diagnosis not present

## 2020-11-16 DIAGNOSIS — X58XXXA Exposure to other specified factors, initial encounter: Secondary | ICD-10-CM | POA: Insufficient documentation

## 2020-11-16 DIAGNOSIS — K449 Diaphragmatic hernia without obstruction or gangrene: Secondary | ICD-10-CM | POA: Diagnosis not present

## 2020-11-16 DIAGNOSIS — I2510 Atherosclerotic heart disease of native coronary artery without angina pectoris: Secondary | ICD-10-CM | POA: Diagnosis not present

## 2020-11-16 DIAGNOSIS — Z01818 Encounter for other preprocedural examination: Secondary | ICD-10-CM

## 2020-11-16 DIAGNOSIS — Z981 Arthrodesis status: Secondary | ICD-10-CM | POA: Diagnosis not present

## 2020-11-16 DIAGNOSIS — Z419 Encounter for procedure for purposes other than remedying health state, unspecified: Secondary | ICD-10-CM

## 2020-11-16 DIAGNOSIS — S32010A Wedge compression fracture of first lumbar vertebra, initial encounter for closed fracture: Secondary | ICD-10-CM | POA: Diagnosis not present

## 2020-11-16 DIAGNOSIS — Z20822 Contact with and (suspected) exposure to covid-19: Secondary | ICD-10-CM | POA: Diagnosis not present

## 2020-11-16 DIAGNOSIS — F418 Other specified anxiety disorders: Secondary | ICD-10-CM | POA: Diagnosis not present

## 2020-11-16 DIAGNOSIS — M4854XA Collapsed vertebra, not elsewhere classified, thoracic region, initial encounter for fracture: Secondary | ICD-10-CM | POA: Diagnosis not present

## 2020-11-16 DIAGNOSIS — M4856XA Collapsed vertebra, not elsewhere classified, lumbar region, initial encounter for fracture: Secondary | ICD-10-CM | POA: Diagnosis not present

## 2020-11-16 HISTORY — PX: KYPHOPLASTY: SHX5884

## 2020-11-16 LAB — CBC
HCT: 44.1 % (ref 36.0–46.0)
Hemoglobin: 14.7 g/dL (ref 12.0–15.0)
MCH: 36.8 pg — ABNORMAL HIGH (ref 26.0–34.0)
MCHC: 33.3 g/dL (ref 30.0–36.0)
MCV: 110.5 fL — ABNORMAL HIGH (ref 80.0–100.0)
Platelets: 183 10*3/uL (ref 150–400)
RBC: 3.99 MIL/uL (ref 3.87–5.11)
RDW: 14.8 % (ref 11.5–15.5)
WBC: 3.1 10*3/uL — ABNORMAL LOW (ref 4.0–10.5)
nRBC: 0 % (ref 0.0–0.2)

## 2020-11-16 LAB — URINALYSIS, ROUTINE W REFLEX MICROSCOPIC
Bilirubin Urine: NEGATIVE
Glucose, UA: NEGATIVE mg/dL
Hgb urine dipstick: NEGATIVE
Ketones, ur: NEGATIVE mg/dL
Leukocytes,Ua: NEGATIVE
Nitrite: NEGATIVE
Protein, ur: 30 mg/dL — AB
Specific Gravity, Urine: 1.011 (ref 1.005–1.030)
pH: 7 (ref 5.0–8.0)

## 2020-11-16 LAB — BASIC METABOLIC PANEL
Anion gap: 5 (ref 5–15)
BUN: 10 mg/dL (ref 8–23)
CO2: 31 mmol/L (ref 22–32)
Calcium: 9.3 mg/dL (ref 8.9–10.3)
Chloride: 100 mmol/L (ref 98–111)
Creatinine, Ser: 0.72 mg/dL (ref 0.44–1.00)
GFR, Estimated: >60 mL/min (ref >60)
Glucose, Bld: 109 mg/dL — ABNORMAL HIGH (ref 70–99)
Potassium: 4.2 mmol/L (ref 3.5–5.1)
Sodium: 136 mmol/L (ref 135–145)

## 2020-11-16 LAB — SURGICAL PCR SCREEN
MRSA, PCR: NEGATIVE
Staphylococcus aureus: NEGATIVE

## 2020-11-16 LAB — PROTIME-INR
INR: 1 (ref 0.8–1.2)
Prothrombin Time: 13.5 seconds (ref 11.4–15.2)

## 2020-11-16 LAB — APTT: aPTT: 31 seconds (ref 24–36)

## 2020-11-16 LAB — SARS CORONAVIRUS 2 BY RT PCR (HOSPITAL ORDER, PERFORMED IN ~~LOC~~ HOSPITAL LAB): SARS Coronavirus 2: NEGATIVE

## 2020-11-16 SURGERY — KYPHOPLASTY
Anesthesia: Monitor Anesthesia Care | Site: Back

## 2020-11-16 MED ORDER — HYDROCODONE-ACETAMINOPHEN 5-325 MG PO TABS
1.0000 | ORAL_TABLET | ORAL | Status: DC | PRN
  Administered 2020-11-16 – 2020-11-17 (×4): 1 via ORAL
  Filled 2020-11-16 (×4): qty 1

## 2020-11-16 MED ORDER — ONDANSETRON HCL 4 MG PO TABS: 4.0000 mg | ORAL_TABLET | Freq: Four times a day (QID) | ORAL | Status: DC | PRN

## 2020-11-16 MED ORDER — ACETAMINOPHEN 10 MG/ML IV SOLN
INTRAVENOUS | Status: DC | PRN
  Administered 2020-11-16: 1000 mg via INTRAVENOUS

## 2020-11-16 MED ORDER — KETAMINE HCL 50 MG/5ML IJ SOSY
PREFILLED_SYRINGE | INTRAMUSCULAR | Status: AC
  Filled 2020-11-16: qty 5

## 2020-11-16 MED ORDER — ONDANSETRON HCL 4 MG/2ML IJ SOLN: 4.0000 mg | Freq: Once | INTRAMUSCULAR | Status: DC | PRN

## 2020-11-16 MED ORDER — ACETAMINOPHEN 10 MG/ML IV SOLN
INTRAVENOUS | Status: AC
  Filled 2020-11-16: qty 100

## 2020-11-16 MED ORDER — KETAMINE HCL 10 MG/ML IJ SOLN
INTRAMUSCULAR | Status: DC | PRN
  Administered 2020-11-16 (×2): 10 mg via INTRAVENOUS

## 2020-11-16 MED ORDER — PROPOFOL 10 MG/ML IV BOLUS
INTRAVENOUS | Status: DC | PRN
  Administered 2020-11-16 (×3): 10 mg via INTRAVENOUS

## 2020-11-16 MED ORDER — BISACODYL 5 MG PO TBEC
5.0000 mg | DELAYED_RELEASE_TABLET | Freq: Every day | ORAL | Status: DC | PRN
Start: 2020-11-16 — End: 2020-11-17

## 2020-11-16 MED ORDER — METOPROLOL TARTRATE 25 MG PO TABS
50.0000 mg | ORAL_TABLET | Freq: Two times a day (BID) | ORAL | Status: DC
  Administered 2020-11-16 – 2020-11-17 (×2): 50 mg via ORAL
  Filled 2020-11-16 (×2): qty 2

## 2020-11-16 MED ORDER — OXYCODONE HCL 5 MG PO TABS: 5.0000 mg | ORAL_TABLET | Freq: Once | ORAL | Status: DC | PRN

## 2020-11-16 MED ORDER — ONDANSETRON HCL 4 MG/2ML IJ SOLN
INTRAMUSCULAR | Status: AC
  Filled 2020-11-16: qty 2

## 2020-11-16 MED ORDER — ACETAMINOPHEN 325 MG PO TABS: 650.0000 mg | ORAL_TABLET | ORAL | Status: DC | PRN

## 2020-11-16 MED ORDER — ACETAMINOPHEN 650 MG RE SUPP: 650.0000 mg | RECTAL | Status: DC | PRN

## 2020-11-16 MED ORDER — LIDOCAINE 2% (20 MG/ML) 5 ML SYRINGE
INTRAMUSCULAR | Status: AC
  Filled 2020-11-16: qty 5

## 2020-11-16 MED ORDER — FUROSEMIDE 20 MG PO TABS: 20.0000 mg | ORAL_TABLET | Freq: Every day | ORAL | Status: DC | PRN

## 2020-11-16 MED ORDER — 0.9 % SODIUM CHLORIDE (POUR BTL) OPTIME
TOPICAL | Status: DC | PRN
  Administered 2020-11-16: 1000 mL

## 2020-11-16 MED ORDER — PANTOPRAZOLE SODIUM 40 MG PO TBEC
40.0000 mg | DELAYED_RELEASE_TABLET | Freq: Every day | ORAL | Status: DC
  Administered 2020-11-17: 40 mg via ORAL
  Filled 2020-11-16: qty 1

## 2020-11-16 MED ORDER — BUPIVACAINE-EPINEPHRINE 0.25% -1:200000 IJ SOLN
INTRAMUSCULAR | Status: DC | PRN
  Administered 2020-11-16: 21.5 mL

## 2020-11-16 MED ORDER — ONDANSETRON HCL 4 MG/2ML IJ SOLN: 4.0000 mg | Freq: Four times a day (QID) | INTRAMUSCULAR | Status: DC | PRN

## 2020-11-16 MED ORDER — SODIUM CHLORIDE 0.9% FLUSH
3.0000 mL | Freq: Two times a day (BID) | INTRAVENOUS | Status: DC
  Administered 2020-11-16: 3 mL via INTRAVENOUS

## 2020-11-16 MED ORDER — BUPIVACAINE LIPOSOME 1.3 % IJ SUSP
INTRAMUSCULAR | Status: AC
  Filled 2020-11-16: qty 20

## 2020-11-16 MED ORDER — ACETAMINOPHEN 10 MG/ML IV SOLN: 1000.0000 mg | Freq: Once | INTRAVENOUS | Status: DC | PRN

## 2020-11-16 MED ORDER — VANCOMYCIN HCL IN DEXTROSE 1-5 GM/200ML-% IV SOLN
INTRAVENOUS | Status: AC
  Administered 2020-11-16: 1000 mg via INTRAVENOUS
  Filled 2020-11-16: qty 200

## 2020-11-16 MED ORDER — LACTATED RINGERS IV SOLN: INTRAVENOUS | Status: DC

## 2020-11-16 MED ORDER — NITROGLYCERIN 0.4 MG SL SUBL: 0.4000 mg | SUBLINGUAL_TABLET | SUBLINGUAL | Status: DC | PRN

## 2020-11-16 MED ORDER — SODIUM CHLORIDE 0.9% FLUSH: 3.0000 mL | INTRAVENOUS | Status: DC | PRN

## 2020-11-16 MED ORDER — CALCIUM CARBONATE ANTACID 500 MG PO CHEW
1.0000 | CHEWABLE_TABLET | Freq: Three times a day (TID) | ORAL | Status: DC | PRN
  Administered 2020-11-16: 200 mg via ORAL
  Filled 2020-11-16 (×2): qty 1

## 2020-11-16 MED ORDER — FENTANYL CITRATE (PF) 100 MCG/2ML IJ SOLN: 25.0000 ug | INTRAMUSCULAR | Status: DC | PRN

## 2020-11-16 MED ORDER — CHLORHEXIDINE GLUCONATE 0.12 % MT SOLN
OROMUCOSAL | Status: AC
  Administered 2020-11-16: 15 mL via OROMUCOSAL
  Filled 2020-11-16: qty 15

## 2020-11-16 MED ORDER — PROPOFOL 1000 MG/100ML IV EMUL
INTRAVENOUS | Status: AC
  Filled 2020-11-16: qty 100

## 2020-11-16 MED ORDER — DEXMEDETOMIDINE (PRECEDEX) IN NS 20 MCG/5ML (4 MCG/ML) IV SYRINGE
PREFILLED_SYRINGE | INTRAVENOUS | Status: DC | PRN
  Administered 2020-11-16 (×4): 4 ug via INTRAVENOUS

## 2020-11-16 MED ORDER — OXYCODONE HCL 5 MG/5ML PO SOLN
5.0000 mg | Freq: Once | ORAL | Status: DC | PRN
Start: 2020-11-16 — End: 2020-11-16

## 2020-11-16 MED ORDER — CHLORHEXIDINE GLUCONATE 0.12 % MT SOLN: 15.0000 mL | Freq: Once | OROMUCOSAL | Status: AC

## 2020-11-16 MED ORDER — MENTHOL 3 MG MT LOZG
1.0000 | LOZENGE | OROMUCOSAL | Status: DC | PRN
  Filled 2020-11-16: qty 9

## 2020-11-16 MED ORDER — PROPOFOL 500 MG/50ML IV EMUL
INTRAVENOUS | Status: DC | PRN
  Administered 2020-11-16: 50 ug/kg/min via INTRAVENOUS

## 2020-11-16 MED ORDER — CALCIUM CARBONATE ANTACID 500 MG PO CHEW
1.0000 | CHEWABLE_TABLET | ORAL | Status: DC | PRN
  Administered 2020-11-16 – 2020-11-17 (×2): 200 mg via ORAL
  Filled 2020-11-16 (×2): qty 1

## 2020-11-16 MED ORDER — BUPIVACAINE-EPINEPHRINE (PF) 0.25% -1:200000 IJ SOLN
INTRAMUSCULAR | Status: AC
  Filled 2020-11-16: qty 30

## 2020-11-16 MED ORDER — IOPAMIDOL (ISOVUE-300) INJECTION 61%
INTRAVENOUS | Status: DC | PRN
  Administered 2020-11-16: 50 mL

## 2020-11-16 MED ORDER — LORAZEPAM 0.5 MG PO TABS
0.5000 mg | ORAL_TABLET | Freq: Three times a day (TID) | ORAL | Status: DC | PRN
  Administered 2020-11-16 – 2020-11-17 (×2): 0.5 mg via ORAL
  Filled 2020-11-16 (×2): qty 1

## 2020-11-16 MED ORDER — HYDROXYUREA 500 MG PO CAPS
500.0000 mg | ORAL_CAPSULE | Freq: Every day | ORAL | Status: DC
  Administered 2020-11-17: 500 mg via ORAL
  Filled 2020-11-16: qty 1

## 2020-11-16 MED ORDER — SODIUM CHLORIDE 0.9 % IV SOLN: 250.0000 mL | INTRAVENOUS | Status: DC

## 2020-11-16 MED ORDER — LIDOCAINE 2% (20 MG/ML) 5 ML SYRINGE
INTRAMUSCULAR | Status: DC | PRN
  Administered 2020-11-16: 40 mg via INTRAVENOUS

## 2020-11-16 MED ORDER — ORAL CARE MOUTH RINSE: 15.0000 mL | Freq: Once | OROMUCOSAL | Status: AC

## 2020-11-16 MED ORDER — PHENOL 1.4 % MT LIQD
1.0000 | OROMUCOSAL | Status: DC | PRN
Start: 2020-11-16 — End: 2020-11-17

## 2020-11-16 MED ORDER — FENTANYL CITRATE (PF) 250 MCG/5ML IJ SOLN
INTRAMUSCULAR | Status: DC | PRN
  Administered 2020-11-16 (×5): 25 ug via INTRAVENOUS

## 2020-11-16 MED ORDER — PROPOFOL 10 MG/ML IV BOLUS
INTRAVENOUS | Status: AC
  Filled 2020-11-16: qty 20

## 2020-11-16 MED ORDER — ONDANSETRON HCL 4 MG/2ML IJ SOLN
INTRAMUSCULAR | Status: DC | PRN
  Administered 2020-11-16: 4 mg via INTRAVENOUS

## 2020-11-16 MED ORDER — BUPIVACAINE LIPOSOME 1.3 % IJ SUSP
INTRAMUSCULAR | Status: DC | PRN
  Administered 2020-11-16: 21.5 mL

## 2020-11-16 MED ORDER — VANCOMYCIN HCL IN DEXTROSE 1-5 GM/200ML-% IV SOLN: 1000.0000 mg | INTRAVENOUS | Status: AC

## 2020-11-16 MED ORDER — POLYETHYLENE GLYCOL 3350 17 G PO PACK: 17.0000 g | PACK | Freq: Every day | ORAL | Status: DC | PRN

## 2020-11-16 MED ORDER — ONDANSETRON HCL 4 MG PO TABS: 4.0000 mg | ORAL_TABLET | Freq: Three times a day (TID) | ORAL | 0 refills | Status: AC | PRN

## 2020-11-16 MED ORDER — FENTANYL CITRATE (PF) 250 MCG/5ML IJ SOLN
INTRAMUSCULAR | Status: AC
  Filled 2020-11-16: qty 5

## 2020-11-16 SURGICAL SUPPLY — 49 items
ADH SKN CLS APL DERMABOND .7 (GAUZE/BANDAGES/DRESSINGS) ×1
ADH SKN CLS LQ APL DERMABOND (GAUZE/BANDAGES/DRESSINGS) ×1
BAG COUNTER SPONGE SURGICOUNT (BAG) IMPLANT
BAG SPNG CNTER NS LX DISP (BAG)
BLADE SURG 15 STRL LF DISP TIS (BLADE) ×1 IMPLANT
BLADE SURG 15 STRL SS (BLADE) ×2
BNDG ADH 1X3 SHEER STRL LF (GAUZE/BANDAGES/DRESSINGS) ×4 IMPLANT
BNDG ADH THN 3X1 STRL LF (GAUZE/BANDAGES/DRESSINGS) ×2
CEMENT KYPHON CX01A KIT/MIXER (Cement) ×3 IMPLANT
COVER MAYO STAND STRL (DRAPES) ×2 IMPLANT
COVER SURGICAL LIGHT HANDLE (MISCELLANEOUS) ×2 IMPLANT
CURETTE EXPRESS SZ2 7MM (INSTRUMENTS) IMPLANT
CURETTE WEDGE 8.5MM KYPHX (MISCELLANEOUS) IMPLANT
CURRETTE EXPRESS SZ2 7MM (INSTRUMENTS)
DERMABOND ADHESIVE PROPEN (GAUZE/BANDAGES/DRESSINGS) ×1
DERMABOND ADVANCED (GAUZE/BANDAGES/DRESSINGS) ×1
DERMABOND ADVANCED .7 DNX12 (GAUZE/BANDAGES/DRESSINGS) ×1 IMPLANT
DERMABOND ADVANCED .7 DNX6 (GAUZE/BANDAGES/DRESSINGS) IMPLANT
DEVICE INFLATION ATRION QL855 (MISCELLANEOUS) ×2 IMPLANT
DRAPE C-ARM 42X72 X-RAY (DRAPES) ×4 IMPLANT
DRAPE INCISE IOBAN 66X45 STRL (DRAPES) ×2 IMPLANT
DRAPE LAPAROTOMY T 102X78X121 (DRAPES) ×2 IMPLANT
DRAPE WARM FLUID 44X44 (DRAPES) ×2 IMPLANT
DURAPREP 26ML APPLICATOR (WOUND CARE) ×2 IMPLANT
GLOVE SURG ENC MOIS LTX SZ6.5 (GLOVE) ×2 IMPLANT
GLOVE SURG MICRO LTX SZ8.5 (GLOVE) ×2 IMPLANT
GLOVE SURG UNDER POLY LF SZ6.5 (GLOVE) ×2 IMPLANT
GLOVE SURG UNDER POLY LF SZ8.5 (GLOVE) IMPLANT
GOWN STRL REUS W/ TWL LRG LVL3 (GOWN DISPOSABLE) ×2 IMPLANT
GOWN STRL REUS W/TWL 2XL LVL3 (GOWN DISPOSABLE) ×2 IMPLANT
GOWN STRL REUS W/TWL LRG LVL3 (GOWN DISPOSABLE) ×4
KIT BASIN OR (CUSTOM PROCEDURE TRAY) ×2 IMPLANT
KIT TURNOVER KIT B (KITS) ×2 IMPLANT
NDL SPNL 22GX3.5 QUINCKE BK (NEEDLE) ×1 IMPLANT
NEEDLE HYPO 22GX1.5 SAFETY (NEEDLE) ×2 IMPLANT
NEEDLE SPNL 22GX3.5 QUINCKE BK (NEEDLE) ×2 IMPLANT
NS IRRIG 1000ML POUR BTL (IV SOLUTION) ×2 IMPLANT
PACK SURGICAL SETUP 50X90 (CUSTOM PROCEDURE TRAY) ×2 IMPLANT
PAD ARMBOARD 7.5X6 YLW CONV (MISCELLANEOUS) ×4 IMPLANT
SPONGE T-LAP 4X18 ~~LOC~~+RFID (SPONGE) ×2 IMPLANT
SUT MNCRL AB 3-0 PS2 18 (SUTURE) ×2 IMPLANT
SYR CONTROL 10ML LL (SYRINGE) ×2 IMPLANT
SYR VACLOK 30 PP/PE WO NDLE (SYRINGE) ×1
SYR VACLOK PP/PE WO NDL 30 (SYRINGE) IMPLANT
TAMP BONE INFLATABLE 10/3 (BALLOONS) ×2 IMPLANT
TOWEL GREEN STERILE (TOWEL DISPOSABLE) ×2 IMPLANT
TRAY KYPHOPAK 15/3 ONESTEP 1ST (MISCELLANEOUS) IMPLANT
TRAY KYPHOPAK 20/3 ONESTEP 1ST (MISCELLANEOUS) IMPLANT
WATER STERILE IRR 1000ML POUR (IV SOLUTION) ×2 IMPLANT

## 2020-11-16 NOTE — Anesthesia Postprocedure Evaluation (Signed)
Anesthesia Post Note  Patient: [name]  Procedure(s) Performed: T11,  L1,KYPHOPLASTY (Back)     Patient location during evaluation: PACU Anesthesia Type: MAC Level of consciousness: awake and alert and oriented Pain management: pain level controlled Vital Signs Assessment: post-procedure vital signs reviewed and stable Respiratory status: spontaneous breathing, nonlabored ventilation and respiratory function stable Cardiovascular status: blood pressure returned to baseline Postop Assessment: no apparent nausea or vomiting Anesthetic complications: no   No notable events documented.          Brennan Bailey

## 2020-11-16 NOTE — Brief Op Note (Signed)
11/16/2020  3:23 PM  PATIENT:  [name]  87 y.o. female  PRE-OPERATIVE DIAGNOSIS:  T11,L1 compression fracture  POST-OPERATIVE DIAGNOSIS:  T11,L1 compression fracture  PROCEDURE:  Procedure(s): T11,  L1,KYPHOPLASTY (N/A)  SURGEON:  Surgeon(s) and Role:    Melina Schools, MD - Primary  PHYSICIAN ASSISTANT:   ASSISTANTS: none   ANESTHESIA:   local and IV sedation  EBL:  minimal   BLOOD ADMINISTERED:none  DRAINS: none   LOCAL MEDICATIONS USED:  MARCAINE    and OTHER exparel  SPECIMEN:  No Specimen  DISPOSITION OF SPECIMEN:  N/A  COUNTS:  YES  TOURNIQUET:  * No tourniquets in log *  DICTATION: .Dragon Dictation  PLAN OF CARE: Admit for overnight observation  PATIENT DISPOSITION:  PACU - hemodynamically stable.

## 2020-11-16 NOTE — Op Note (Signed)
OPERATIVE REPORT  DATE OF SURGERY: 11/16/2020  PATIENT NAME:  [name] MRN: [id number] DOB: 1930/03/16  PCP: Colon Branch, MD  PRE-OPERATIVE DIAGNOSIS: T11 and L1 osteoporotic compression fracture  POST-OPERATIVE DIAGNOSIS: Same  PROCEDURE:   T11 and L1 kyphoplasty  SURGEON:  Melina Schools, MD  PHYSICIAN ASSISTANT: None  ANESTHESIA:   IV sedation with local anesthesia  EBL: Minimal   BRIEF HISTORY: [name] is a 87 y.o. female who presents with significant thoracolumbar pain.  She has had prior kyphoplasty's at T 9, T10, and L2.  Imaging studies demonstrated osteoporotic compression fractures of T11 and L1.  As result of severe pain we elected to move forward with kyphoplasty under IV sedation at the additional levels.  All appropriate risks, benefits, alternatives to surgery were discussed with the patient and consent was obtained.  PROCEDURE DETAILS: Patient was brought into the operating room.  And placed prone onto the operating room table.  IV sedation was then provided and the back was prepped and draped in a standard fashion.  Timeout was performed to confirm patient procedure and all other important data.  Using fluoroscopic guidance the T11 vertebral body was identified in both the AP and lateral planes.  After the patient was made comfortable with IV sedation I marked out the lateral border of the T11 pedicle and infiltrated the skin area with quarter percent Marcaine with epinephrine.  I then injected deep down to the lateral aspect of the pedicle and facet complex of T11 with quarter percent Marcaine mixed with Exparel.  I then made a small incision and advanced the trocar down to the lateral aspect of the pedicle.  Using an extrapedicular approach I advanced the Jamshidi needle down to the midportion of the pedicle and then into the pedicle and then just to the posterior margin of the vertebral body I confirmed satisfactory trajectory in the AP and lateral  planes and then advanced the trocar into the vertebral body.  The same technique was used to place the contralateral trocar.  I then used the drill and then sounded the canal to ensure was a solid bony canal I then placed the inflatable bone tamps.  I then deflated the tamps and inserted the cement.  The cement was allowed to harden.  Throughout this procedure I used fluoroscopic guidance to confirm satisfactory position of the cement mantle with no leak.  Once the cement was allowed to harden I remove the trocar and then reposition the imaging studies to visualize the L1 pedicle.  Using the same technique I cannulated the L1 pedicle and advanced into the vertebral body.  I then used the drill and the inflatable bone tamp.  Once the void was created I then inserted the cement.  I again used fluoroscopic guidance to confirm satisfactory positioning of the cement mantle and that there was no leak.  Once the cement was placed and allowed to harden I remove the trocar.  The skin was cleaned and all 4 stab incisions were closed with interrupted 3-0 Monocryl sutures.  Dry dressings were applied.  Patient was transferred to the PACU without incident.  The end of the case all needle and sponge counts were correct.  There were no adverse intraoperative events  Melina Schools, MD 11/16/2020 3:17 PM

## 2020-11-16 NOTE — Transfer of Care (Signed)
Immediate Anesthesia Transfer of Care Note  Patient: [name]  Procedure(s) Performed: T11,  L1,KYPHOPLASTY (Back)  Patient Location: PACU  Anesthesia Type:MAC  Level of Consciousness: awake, alert , oriented and patient cooperative  Airway & Oxygen Therapy: Patient Spontanous Breathing and Patient connected to face mask oxygen  Post-op Assessment: Report given to RN and Post -op Vital signs reviewed and stable  Post vital signs: Reviewed  Last Vitals:  Vitals Value Taken Time  BP 134/67 11/16/20 1522  Temp    Pulse 64 11/16/20 1526  Resp 14 11/16/20 1529  SpO2 100 % 11/16/20 1526  Vitals shown include unvalidated device data.  Last Pain:  Vitals:   11/16/20 1209  TempSrc:   PainSc: 0-No pain      Patients Stated Pain Goal: 3 (55/97/41 6384)  Complications: No notable events documented.

## 2020-11-16 NOTE — Anesthesia Procedure Notes (Signed)
Procedure Name: MAC Date/Time: 11/16/2020 1:58 PM Performed by: Jenne Campus, CRNA Pre-anesthesia Checklist: Patient identified, Emergency Drugs available and Suction available Oxygen Delivery Method: Simple face mask

## 2020-11-16 NOTE — H&P (Signed)
Addendum H&P: Patient continues to have significant thoracolumbar pain secondary to a two-level osteoporotic compression fracture T11 and L1.  After discussing the risks, and benefits of surgery she is elected to move forward with surgery.  She is expressed an understanding of these risks as well as a willingness to move forward.  There is been no change in her clinical exam since her last office visit of 11/11/2020.

## 2020-11-17 ENCOUNTER — Other Ambulatory Visit: Payer: Self-pay | Admitting: Family

## 2020-11-17 ENCOUNTER — Encounter (HOSPITAL_COMMUNITY): Payer: Self-pay | Admitting: Orthopedic Surgery

## 2020-11-17 DIAGNOSIS — M8008XA Age-related osteoporosis with current pathological fracture, vertebra(e), initial encounter for fracture: Secondary | ICD-10-CM | POA: Diagnosis not present

## 2020-11-17 DIAGNOSIS — Z20822 Contact with and (suspected) exposure to covid-19: Secondary | ICD-10-CM | POA: Diagnosis not present

## 2020-11-17 DIAGNOSIS — S32010A Wedge compression fracture of first lumbar vertebra, initial encounter for closed fracture: Secondary | ICD-10-CM | POA: Diagnosis not present

## 2020-11-17 DIAGNOSIS — D751 Secondary polycythemia: Secondary | ICD-10-CM

## 2020-11-17 DIAGNOSIS — S22080A Wedge compression fracture of T11-T12 vertebra, initial encounter for closed fracture: Secondary | ICD-10-CM | POA: Diagnosis not present

## 2020-11-17 DIAGNOSIS — I2510 Atherosclerotic heart disease of native coronary artery without angina pectoris: Secondary | ICD-10-CM | POA: Diagnosis not present

## 2020-11-17 DIAGNOSIS — Z8673 Personal history of transient ischemic attack (TIA), and cerebral infarction without residual deficits: Secondary | ICD-10-CM | POA: Diagnosis not present

## 2020-11-17 DIAGNOSIS — R262 Difficulty in walking, not elsewhere classified: Secondary | ICD-10-CM | POA: Diagnosis not present

## 2020-11-17 LAB — GLUCOSE, CAPILLARY: Glucose-Capillary: 94 mg/dL (ref 70–99)

## 2020-11-17 MED ORDER — MAGNESIUM CITRATE PO SOLN
0.5000 | Freq: Once | ORAL | Status: AC
  Administered 2020-11-17: 0.5 via ORAL
  Filled 2020-11-17: qty 296

## 2020-11-17 MED ORDER — ALUM & MAG HYDROXIDE-SIMETH 200-200-20 MG/5ML PO SUSP: 30.0000 mL | Freq: Four times a day (QID) | ORAL | Status: DC | PRN

## 2020-11-17 NOTE — Progress Notes (Signed)
Patient is discharged from room 3C08 at this time. Alert and in stable condition. IV site d/c'd and instructions read to patient and grandson with understanding verbalized and all questions answered. Left unit via wheelchair with all belongings at side.

## 2020-11-17 NOTE — Evaluation (Signed)
Physical Therapy Evaluation Patient Details Name: [name] MRN: [id number] DOB: Oct 19, 1929 Today's Date: 11/17/2020   History of Present Illness  87 year old female with known osteoporosis (On alendronate). History of T9,T 10 and L2 compression fxs w/ Kyphoplasty done by IR. New T11 and L1 compression fractures. Patient is currently enrolled in pain management and taking hydrocodone. s/p 11/15/20 T11-L1 kyphoplasty PMH: T9-T10, L2 compression fx and prior kyphoplasty L hip fx, IM nail, OA, osteoporosis, CVA, CAD, CA, HTN,  Clinical Impression  PTA pt living alone in two story home with bed and bath upstairs and level entry. Pt independent in limited community ambulation, has assist from [employer] 3 hrs 3x/wk for assist with cooking and cleaning. Pt independent in bADLs. Pt reports daughter lives nearby but she just had surgery and is not able to care for her. Pt reports getting up 5x during the night reducing her safety. Pt is currently limited in safe mobility by increased back and R hip pain with weightbearing limiting her distance of safe ambulation, in presence of generalized weakness and decreased balance. Pt currently requires min guard for all out of bed activity. Given level of assist needed and lack of family support currently, PT recommends SNF level rehab to improve safety with mobility prior to discharge home alone. PT will continue to follow acutely.      Follow Up Recommendations Supervision/Assistance - 24 hour;SNF    Equipment Recommendations  3in1 (PT)    Recommendations for Other Services OT consult     Precautions / Restrictions Precautions Precautions: Fall;Back Precaution Booklet Issued: Yes (comment) Precaution Comments: able to recall 2/3 precautions at end of session, No brace Restrictions Weight Bearing Restrictions: No      Mobility  Bed Mobility Overal bed mobility: Needs Assistance Bed Mobility: Sidelying to Sit;Sit to  Sidelying;Rolling Rolling: Supervision Sidelying to sit: Supervision     Sit to sidelying: Supervision General bed mobility comments: flattened bed to simulate home environment, vc for coming into fully sidelying before coming to sitting or transitioning back into lying    Transfers Overall transfer level: Needs assistance Equipment used: Rolling walker (2 wheeled) Transfers: Sit to/from Stand Sit to Stand: Min guard         General transfer comment: cues for safety  Ambulation/Gait Ambulation/Gait assistance: Min guard Gait Distance (Feet): 60 Feet Assistive device: Standard walker Gait Pattern/deviations: Step-through pattern;Decreased step length - right;Decreased step length - left;Decreased stance time - right;Decreased weight shift to right;Antalgic;Narrow base of support Gait velocity: slowed Gait velocity interpretation: <1.31 ft/sec, indicative of household ambulator General Gait Details: min guard for safety, slow, mildly unsteady gait, with very narrow BoS, with ambulation progression increased R hip pain reducing stance time and weightshift. vc for wider BoS and maintaining shoulders over hips      Balance Overall balance assessment: Needs assistance;History of Falls Sitting-balance support: Feet supported Sitting balance-Leahy Scale: Good     Standing balance support: No upper extremity supported;During functional activity Standing balance-Leahy Scale: Fair Standing balance comment: able to pull up pants without UE support                             Pertinent Vitals/Pain Pain Assessment: 0-10 Pain Score: 8  Pain Location: back, R hip Pain Descriptors / Indicators: Grimacing;Discomfort Pain Intervention(s): Limited activity within patient's tolerance;Monitored during session;RN gave pain meds during session;Repositioned    Home Living Family/patient expects to be discharged to:: Private residence Living  Arrangements: Alone Available Help at  Discharge: Personal care attendant;Available PRN/intermittently Type of Home: House Home Access: Level entry     Home Layout: Two level;Able to live on main level with bedroom/bathroom Home Equipment: Latina Craver - 2 wheels;Cane - single point;Shower seat - built in;Walker - 4 wheels;Hand held shower head      Prior Function Level of Independence: Independent with assistive device(s)         Comments: ambulates with walker, patient reports has hired seniors helping seniors in the past for assist as needed "but it's expensive"     Hand Dominance   Dominant Hand: Right    Extremity/Trunk Assessment   Upper Extremity Assessment Upper Extremity Assessment: Defer to OT evaluation    Lower Extremity Assessment Lower Extremity Assessment: RLE deficits/detail;LLE deficits/detail RLE Deficits / Details: ROM WFL, strength grossly 3+/5 in mobility LLE Deficits / Details: hx of hip fx and IM nail, palpable disfunction causing pain, ROM WFL, pain with weightbearing    Cervical / Trunk Assessment Cervical / Trunk Assessment: Kyphotic  Communication   Communication: HOH  Cognition Arousal/Alertness: Awake/alert Behavior During Therapy: WFL for tasks assessed/performed Overall Cognitive Status: Within Functional Limits for tasks assessed                                 General Comments: poor understanding of deficits especially safety at night      General Comments General comments (skin integrity, edema, etc.): VSS on RA    Exercises     Assessment/Plan    PT Assessment Patient needs continued PT services  PT Problem List Decreased strength;Decreased activity tolerance;Decreased balance;Decreased mobility;Decreased cognition       PT Treatment Interventions DME instruction;Gait training;Functional mobility training;Therapeutic activities;Therapeutic exercise;Balance training;Cognitive remediation;Patient/family education    PT Goals (Current goals  can be found in the Care Plan section)  Acute Rehab PT Goals Patient Stated Goal: less pain PT Goal Formulation: With patient Time For Goal Achievement: 12/01/20 Potential to Achieve Goals: Good    Frequency Min 5X/week   Barriers to discharge Decreased caregiver support         AM-PAC PT "6 Clicks" Mobility  Outcome Measure Help needed turning from your back to your side while in a flat bed without using bedrails?: None Help needed moving from lying on your back to sitting on the side of a flat bed without using bedrails?: None Help needed moving to and from a bed to a chair (including a wheelchair)?: None Help needed standing up from a chair using your arms (e.g., wheelchair or bedside chair)?: None Help needed to walk in hospital room?: None Help needed climbing 3-5 steps with a railing? : A Little 6 Click Score: 23    End of Session   Activity Tolerance: Patient limited by pain Patient left: in chair;with call bell/phone within reach Nurse Communication: Mobility status PT Visit Diagnosis: Unsteadiness on feet (R26.81);Other abnormalities of gait and mobility (R26.89);Difficulty in walking, not elsewhere classified (R26.2);Muscle weakness (generalized) (M62.81);Pain Pain - Right/Left: Right Pain - part of body: Hip    Time: 6568-1275 PT Time Calculation (min) (ACUTE ONLY): 21 min   Charges:   PT Evaluation $PT Eval Low Complexity: 1 Low          Farris Blash B. Migdalia Dk PT, DPT Acute Rehabilitation Services Pager [phone] Office [phone]   [place] 11/17/2020, 10:35 AM

## 2020-11-17 NOTE — Evaluation (Signed)
Occupational Therapy Evaluation Patient Details Name: [name] MRN: [id number] DOB: 1930-01-06 Today's Date: 11/17/2020    History of Present Illness Patient is an 87 year old female s/p T11, L1 kyphoplasty. PMH includes L hip fracture, breast CA, knee replacement   Clinical Impression   Patient lives alone in a two story house, stays on main level and is typically independent with self care tasks. Uses walker for ambulation since fall ~1 year ago "I had a seizure and cracked my skull, broke my hip." Currently patient reporting pain in back, needing moderate cues to maintain back precautions and up to mod A for lower body dressing tasks. Due to increased assist levels with self care and patient reports limited help available at D/C would recommend short term rehab prior to D/C home. Spoke with nursing who reports daughter has spoken with MD regarding patient going to rehab post surgery as well. Acute OT to follow.    Follow Up Recommendations  Follow surgeon's recommendation for DC plan and follow-up therapies;SNF    Equipment Recommendations  None recommended by OT       Precautions / Restrictions Precautions Precautions: Fall;Back Precaution Booklet Issued: Yes (comment) Restrictions Weight Bearing Restrictions: No      Mobility Bed Mobility Overal bed mobility: Needs Assistance Bed Mobility: Sidelying to Sit;Sit to Sidelying   Sidelying to sit: Supervision;HOB elevated     Sit to sidelying: Min assist;HOB elevated General bed mobility comments: cues not to twist back getting back into bed and min A to lift legs    Transfers Overall transfer level: Needs assistance Equipment used: Rolling walker (2 wheeled) Transfers: Sit to/from Stand Sit to Stand: Min guard         General transfer comment: cues for safety    Balance Overall balance assessment: Needs assistance;History of Falls Sitting-balance support: Feet supported Sitting balance-Leahy Scale: Good      Standing balance support: No upper extremity supported;During functional activity Standing balance-Leahy Scale: Fair Standing balance comment: able to pull up pants without UE support                           ADL either performed or assessed with clinical judgement   ADL Overall ADL's : Needs assistance/impaired Eating/Feeding: Independent   Grooming: Wash/dry face;Set up;Wash/dry hands;Standing   Upper Body Bathing: Set up;Sitting Upper Body Bathing Details (indicate cue type and reason): to wash arms, chest. assist with back Lower Body Bathing: Minimal assistance;Sitting/lateral leans Lower Body Bathing Details (indicate cue type and reason): cues not to bend too far forward when washing lower legs Upper Body Dressing : Minimal assistance;Sitting Upper Body Dressing Details (indicate cue type and reason): assist to button blouse Lower Body Dressing: Moderate assistance;Sitting/lateral leans;Sit to/from stand Lower Body Dressing Details (indicate cue type and reason): to thread lower extremities through pants Toilet Transfer: Min guard;Ambulation;RW;Regular Toilet;Grab bars Toilet Transfer Details (indicate cue type and reason): min guard for safety, cues for hand placement to hold grab bar when sitting Toileting- Clothing Manipulation and Hygiene: Min guard;Sit to/from stand       Functional mobility during ADLs: Min guard;Rolling walker;Cueing for safety;Cueing for sequencing General ADL Comments: needing mod cues to maintain back precautions      Pertinent Vitals/Pain Pain Assessment: 0-10 Pain Score: 9  Pain Location: back Pain Descriptors / Indicators: Grimacing;Discomfort Pain Intervention(s): Patient requesting pain meds-RN notified     Hand Dominance Right   Extremity/Trunk Assessment Upper Extremity Assessment Upper  Extremity Assessment: Generalized weakness   Lower Extremity Assessment Lower Extremity Assessment: Defer to PT evaluation    Cervical / Trunk Assessment Cervical / Trunk Assessment: Kyphotic   Communication Communication Communication: HOH   Cognition Arousal/Alertness: Awake/alert Behavior During Therapy: WFL for tasks assessed/performed Overall Cognitive Status: Within Functional Limits for tasks assessed                                                Home Living Family/patient expects to be discharged to:: Private residence Living Arrangements: Alone Available Help at Discharge: Personal care attendant;Available PRN/intermittently Type of Home: House Home Access: Level entry     Home Layout: Two level;Able to live on main level with bedroom/bathroom     Bathroom Shower/Tub: Hospital doctor Toilet: Handicapped height     Home Equipment: Latina Craver - 2 wheels;Cane - single point;Shower seat - built in;Walker - 4 wheels;Hand held shower head          Prior Functioning/Environment Level of Independence: Independent with assistive device(s)        Comments: ambulates with walker, patient reports has hired seniors helping seniors in the past for assist as needed "but it's expensive"        OT Problem List: Pain;Decreased knowledge of precautions;Decreased knowledge of use of DME or AE;Decreased safety awareness;Decreased activity tolerance;Impaired balance (sitting and/or standing)      OT Treatment/Interventions: Self-care/ADL training;DME and/or AE instruction;Therapeutic activities;Patient/family education;Balance training    OT Goals(Current goals can be found in the care plan section) Acute Rehab OT Goals Patient Stated Goal: less pain OT Goal Formulation: With patient Time For Goal Achievement: 12/01/20 Potential to Achieve Goals: Good  OT Frequency: Min 2X/week   Barriers to D/C: Decreased caregiver support  patient lives home alone, reports daughter recently had surgery therefore unable to stay with her          AM-PAC OT "6 Clicks"  Daily Activity     Outcome Measure Help from another person eating meals?: None Help from another person taking care of personal grooming?: A Little Help from another person toileting, which includes using toliet, bedpan, or urinal?: A Little Help from another person bathing (including washing, rinsing, drying)?: A Little Help from another person to put on and taking off regular upper body clothing?: A Little Help from another person to put on and taking off regular lower body clothing?: A Little 6 Click Score: 19   End of Session Equipment Utilized During Treatment: Rolling walker Nurse Communication: Mobility status  Activity Tolerance: Patient tolerated treatment well;Patient limited by pain Patient left: in bed;with call bell/phone within reach;with bed alarm set  OT Visit Diagnosis: Pain Pain - part of body:  (back)                Time: 7092-9574 OT Time Calculation (min): 28 min Charges:  OT General Charges $OT Visit: 1 Visit OT Evaluation $OT Eval Low Complexity: 1 Low OT Treatments $Self Care/Home Management : 8-22 mins  Delbert Phenix OT OT pager: [place] 11/17/2020, 8:57 AM

## 2020-11-17 NOTE — Progress Notes (Signed)
    Subjective: Procedure(s) (LRB): T11,  L1,KYPHOPLASTY (N/A) 1 Day Post-Op  Patient reports pain as 1 on 0-10 scale.  Reports decreased leg pain reports incisional back pain   Positive void Negative bowel movement Positive flatus Negative chest pain or shortness of breath  Objective: Vital signs in last 24 hours: Temp:  [97.5 F (36.4 C)-97.9 F (36.6 C)] 97.9 F (36.6 C) (07/07 1206) Pulse Rate:  [54-65] 54 (07/07 1206) Resp:  [12-18] 18 (07/07 1206) BP: (108-161)/(56-80) 123/59 (07/07 1206) SpO2:  [93 %-100 %] 93 % (07/07 1206)  Intake/Output from previous day: 07/06 0701 - 07/07 0700 In: 661.6 [I.V.:400; IV Piggyback:261.6] Out: 10 [Blood:10]  Labs: Recent Labs    11/16/20 1135  WBC 3.1*  RBC 3.99  HCT 44.1  PLT 183   Recent Labs    11/16/20 1135  NA 136  K 4.2  CL 100  CO2 31  BUN 10  CREATININE 0.72  GLUCOSE 109*  CALCIUM 9.3   Recent Labs    11/16/20 1135  INR 1.0    Physical Exam: Neurologically intact ABD soft Intact pulses distally Incision: dressing C/D/I and no drainage Compartment soft Body mass index is 17.75 kg/m.   Assessment/Plan: Patient stable  xrays n/a Continue mobilization with physical therapy Continue care  Patient is doing quite well status post two-level kyphoplasty.  She is ambulated and has been cleared from physical therapy standpoint.  There was concern about falls especially at night and so I will arrange home health services.  According to the daughter they do have somebody that comes in and this should be satisfactory.  At this point patient is free to ambulate with assistance.  I will have her follow-up with me in 2 weeks.  Melina Schools, MD [facility] [phone]

## 2020-11-18 ENCOUNTER — Encounter: Payer: Self-pay | Admitting: Hematology & Oncology

## 2020-11-18 NOTE — Discharge Summary (Signed)
Patient ID: [name] MRN: [id number] DOB/AGE: [birth year] 87 y.o.  Admit date: 11/16/2020 Discharge date: 11/17/2020  Admission Diagnoses:  Active Problems:   Lumbar compression fracture [name])   Discharge Diagnoses:  Active Problems:   Lumbar compression fracture (HCC)  status post Procedure(s): T11,  L1,KYPHOPLASTY  Past Medical History:  Diagnosis Date   CAD (coronary artery disease)    mild nonobstructive disease on cath in 2003   Cancer [facility])    Cardiomyopathy    Probable Takotsubo, severe CP w/ normal cath in 1994. Severe CP in 2003 w/ widespread T wave inversions on ECG. Cath w/ minimal coronary disease but LV-gram showed periapical severe hypokinesis and basilar hyperkinesia (EF 40%). Last echo in 4/09 confirmed full LV functional recovery with EF 60%, no regional wall motion abnormalities, mild to moderate LVH.   Chronic diastolic CHF (congestive heart failure) ([place]) 09/13/2019   CVA (cerebral infarction)    Depression with anxiety 03-29-11   lost husband 3'09   Diverticulitis    DVT (deep venous thrombosis) ([place])    after venous ablation, R leg   Dyspnea    E. coli gastroenteritis 03-29-11   8'10   Fracture 09/10/07   L2, status post vertebroplasty of L2 performed by IR   GERD (gastroesophageal reflux disease)    Headache(784.0)    migraines   Hemorrhoids    Hiatal hernia 03-29-11   no nerve problems   Hyperlipidemia    Hyperplastic colon polyp 06/2001   Hypertension    Iron deficiency anemia due to chronic blood loss 12/30/2017   OA (osteoarthritis) of knee 03-29-11   w/ bilateral knee pain-not a problem now   OSA (obstructive sleep apnea) 03-29-11   no cpap used, not a problem now.   Osteoporosis    Otalgia of both ears    Dr. Simeon Parrish   Polycythemia    Stroke [facility]) 03-29-11   CVA x2 -last 10'12-?TIA(visual problems)   Varicose vein     Surgeries: Procedure(s): T11,  L1,KYPHOPLASTY on 11/16/2020   Consultants:   Discharged Condition:  Improved  Parrish Course: [name] is an 87 y.o. female who was admitted 11/16/2020 for operative treatment of T11, L1 compression fractures. Patient failed conservative treatments (please see the history and physical for the specifics) and had severe unremitting pain that affects sleep, daily activities and work/hobbies. After pre-op clearance, the patient was taken to the operating room on 11/16/2020 and underwent  Procedure(s): T11,  L1,KYPHOPLASTY.    Patient was given perioperative antibiotics:  Anti-infectives (From admission, onward)    Start     Dose/Rate Route Frequency Ordered Stop   11/16/20 1133  vancomycin (VANCOCIN) IVPB 1000 mg/200 mL premix        1,000 mg 200 mL/hr over 60 Minutes Intravenous 60 min pre-op 11/16/20 1133 11/16/20 1714        Patient was given sequential compression devices and early ambulation to prevent DVT.   Patient benefited maximally from Parrish stay and there were no complications. At the time of discharge, the patient was urinating/moving their bowels without difficulty, tolerating a regular diet, pain is controlled with oral pain medications and they have been cleared by PT/OT.   Recent vital signs: Patient Vitals for the past 24 hrs:  BP Temp Pulse Resp SpO2  11/17/20 1206 (!) 123/59 97.9 F (36.6 C) (!) 54 18 93 %     Recent laboratory studies:  Recent Labs    11/16/20 1135  WBC 3.1*  HGB 14.7  HCT 44.1  PLT 183  NA 136  K 4.2  CL 100  CO2 31  BUN 10  CREATININE 0.72  GLUCOSE 109*  INR 1.0  CALCIUM 9.3     Discharge Medications:   Allergies as of 11/17/2020       Reactions   Penicillins Hives, Rash, Other (See Comments)   Has patient had a PCN reaction causing immediate rash, facial/tongue/throat swelling, SOB or lightheadedness with hypotension: Yes Has patient had a PCN reaction causing severe rash involving mucus membranes or skin necrosis: yes Has patient had a PCN reaction that required hospitalization: No Has  patient had a PCN reaction occurring within the last 10 years: no If all of the above answers are "NO", then may proceed with Cephalosporin use. Other Reaction: OTHER REACTION   Valsartan Itching, Other (See Comments)   Atorvastatin Diarrhea   Carvedilol Other (See Comments)   "teeth hurt when I took it"   Ezetimibe Nausea Only   Fluvastatin Sodium Other (See Comments)   "Can't remember"   Magnesium Hydroxide Other (See Comments)   REACTION: triggers HAs   Meloxicam Other (See Comments)   Pt seeing auro's / spots.     Pneumovax [pneumococcal Polysaccharide Vaccine] Other (See Comments)   Local reaction   Quinapril Hcl Other (See Comments)    "feelings of tiredness"   Simvastatin Diarrhea   Topamax [topiramate] Itching   Vit D-vit E-safflower Oil Other (See Comments)   Headaches        Medication List     STOP taking these medications    aspirin 81 MG EC tablet   celecoxib 100 MG capsule Commonly known as: CELEBREX   Lidocaine 4 % Ptch   vitamin C 500 MG tablet Commonly known as: ASCORBIC ACID       TAKE these medications    alendronate 70 MG tablet Commonly known as: FOSAMAX Take 1 tablet (70 mg total) by mouth every 7 (seven) days. Take with a full glass of water on an empty stomach.   bismuth subsalicylate 578 IO/96EX suspension Commonly known as: PEPTO BISMOL Take 30 mLs by mouth every 4 (four) hours as needed for indigestion (heartburn).   calcium carbonate 500 MG chewable tablet Commonly known as: TUMS - dosed in mg elemental calcium Chew 1-2 tablets by mouth daily as needed for indigestion or heartburn.   HYDROcodone-acetaminophen 5-325 MG tablet Commonly known as: NORCO/VICODIN Take 1 tablet by mouth 3 (three) times daily as needed for severe pain.   lactose free nutrition Liqd Take 237 mLs by mouth daily.   metoprolol tartrate 50 MG tablet Commonly known as: LOPRESSOR Take 1 tablet (50 mg total) by mouth 2 (two) times daily.   nitroGLYCERIN  0.4 MG SL tablet Commonly known as: NITROSTAT Place 1 tablet (0.4 mg total) under the tongue every 5 (five) minutes x 3 doses as needed for chest pain.   ondansetron 4 MG tablet Commonly known as: Zofran Take 1 tablet (4 mg total) by mouth every 8 (eight) hours as needed for up to 5 days for nausea or vomiting.   polyethylene glycol 17 g packet Commonly known as: MIRALAX / GLYCOLAX Take 17 g by mouth daily as needed for moderate constipation.       ASK your doctor about these medications    furosemide 20 MG tablet Commonly known as: Lasix Take 1 tablet (20 mg total) by mouth daily.   hydroxyurea 500 MG capsule Commonly known as: HYDREA TAKE 1 CAPSULE BY MOUTH EVERY  DAY. MAY TAKE WITH FOOD TO MINIMIZE GI SIDE EFFECTS   LORazepam 0.5 MG tablet Commonly known as: ATIVAN 1/2 tab in AM, 1/2 tab PM, 1/2 to 1 tab hs prn   omeprazole 20 MG capsule Commonly known as: PRILOSEC Take 1 capsule (20 mg total) by mouth daily.   senna-docusate 8.6-50 MG tablet Commonly known as: Senokot-S Take 1 tablet by mouth 2 (two) times daily.        Diagnostic Studies: DG Chest 2 View  Result Date: 11/16/2020 CLINICAL DATA:  Preoperative examination. Patient for surgery today. EXAM: CHEST - 2 VIEW COMPARISON:  PA and lateral chest 03/04/2020. Plain films lumbar spine 11/16/2020. FINDINGS: The lungs are clear. Heart size is normal. Hiatal hernia noted. No pneumothorax or pleural fluid. Surgical clips left breast are seen. As seen on the prior lumbar spine films, there is a severe compression fracture deformity of T11 which is unchanged in appearance. There has been worsening of vertebral body height loss at L1 where there is now near vertebra plana deformity. No new fracture is identified. Postoperative change of prior vertebral augmentation noted. IMPRESSION: Severe T11 compression fracture is unchanged but there has been worsening of vertebral body height loss at L1 since the prior lumbar spine plain  films. No new fracture. No acute cardiopulmonary disease. Hiatal hernia. Electronically Signed   By: Inge Rise M.D.   On: 11/16/2020 12:21   DG THORACOLUMABAR SPINE  Result Date: 11/16/2020 CLINICAL DATA:  Elective surgery. Additional history provided: T11, L1 kyphoplasty. Provided fluoroscopy time 296.4 seconds (64.6 mGy). EXAM: THORACOLUMBAR SPINE 1V COMPARISON:  Lumbar spine radiographs 10/26/2020. FINDINGS: AP and lateral view intraprocedural fluoroscopic images of the thoracolumbar spine are submitted, 3 images total. On the provided images, kyphoplasty material is present within the T9, T10, T11, L1 and L2 vertebrae at sites of known compression fractures. Kyphoplasty at the T11 and L1 levels is new as compared to the prior lumbar spine radiographs of 10/26/2020. IMPRESSION: Three intraprocedural fluoroscopic images of the thoracolumbar spine from T11 and L1 kyphoplasty, as described. Electronically Signed   By: Kellie Simmering DO   On: 11/16/2020 15:26   DG Lumbar Spine Complete  Result Date: 10/27/2020 CLINICAL DATA:  Chronic low back pain EXAM: LUMBAR SPINE - COMPLETE 4+ VIEW COMPARISON:  September 04, 2019 lumbar radiograph; lumbar CT Oct 03, 2020. Thoracic and lumbar spine CT examinations July 04, 2020 FINDINGS: Frontal, lateral, spot lumbosacral lateral, and bilateral oblique views were obtained. Bones osteoporotic. There are 5 non-rib-bearing lumbar type vertebral bodies. Patient is status post kyphoplasty procedure at L2. Patient is status post kyphoplasty procedures for fractures at T9 and T10. There is moderately severe wedging of the T11 vertebral body. There is also anterior wedging at L1, not present on most recent study. There are areas of endplate concavity at L3 and L4, stable. No appreciable spondylolisthesis. There is disc space narrowing at L4-5. Other disc spaces appear unremarkable. There is facet osteoarthritic change at L4-5 and L5-S1 bilaterally. There is aortic  atherosclerosis. IMPRESSION: Osteoporosis. Previous kyphoplasty procedures at T9, T10, and L2. There is moderately severe anterior wedging at T11 with milder anterior wedging at L1, not present on prior studies. No appreciable spondylolisthesis. Moderate disc space narrowing noted at L4-5. Facet osteoarthritic change at L4-5 and L5-S1 bilaterally. Aortic Atherosclerosis (ICD10-I70.0). Electronically Signed   By: [name] III M.D.   On: 10/27/2020 10:18   DG C-Arm 1-60 Min  Result Date: 11/16/2020 CLINICAL DATA:  Elective surgery. Additional history provided:  T11, L1 kyphoplasty. Provided fluoroscopy time 296.4 seconds (64.6 mGy). EXAM: THORACOLUMBAR SPINE 1V COMPARISON:  Lumbar spine radiographs 10/26/2020. FINDINGS: AP and lateral view intraprocedural fluoroscopic images of the thoracolumbar spine are submitted, 3 images total. On the provided images, kyphoplasty material is present within the T9, T10, T11, L1 and L2 vertebrae at sites of known compression fractures. Kyphoplasty at the T11 and L1 levels is new as compared to the prior lumbar spine radiographs of 10/26/2020. IMPRESSION: Three intraprocedural fluoroscopic images of the thoracolumbar spine from T11 and L1 kyphoplasty, as described. Electronically Signed   By: Kellie Simmering DO   On: 11/16/2020 15:26   DG HIP UNILAT WITH PELVIS 2-3 VIEWS RIGHT  Result Date: 10/27/2020 CLINICAL DATA:  Pain EXAM: DG HIP (WITH OR WITHOUT PELVIS) 2-3V RIGHT COMPARISON:  Oct 05, 2020 FINDINGS: Frontal pelvis as well as frontal and lateral right hip images were obtained. Bones are diffusely osteoporotic. Postoperative change noted in the visualized proximal left femur. Old healed fracture left ischium. No acute fracture or dislocation. There is moderate symmetric narrowing of each hip joint, stable. There is degenerative change in the left sacroiliac joint, stable. No erosive changes. IMPRESSION: Osteoporosis. Postoperative screw and nail fixation in the  proximal left femur, stable, with evidence of prior fracture of the left ischium with remodeling. No acute fracture or dislocation. Moderate symmetric narrowing each hip joint. Moderate osteoarthritic change sacroiliac joint. Electronically Signed   By: [name] III M.D.   On: 10/27/2020 10:12    Discharge Instructions     Incentive spirometry RT   Complete by: As directed         Follow-up Information     Melina Schools, MD Follow up in 2 week(s).   Specialty: Orthopedic Surgery Contact information: [address] [place] [address] [phone]                 Discharge Plan:  discharge to home  Disposition: stable    Signed: Charlyne Petrin  [facility] [phone] 11/18/2020, 8:16 AM

## 2020-11-21 ENCOUNTER — Encounter: Payer: Self-pay | Admitting: Internal Medicine

## 2020-11-21 ENCOUNTER — Ambulatory Visit (INDEPENDENT_AMBULATORY_CARE_PROVIDER_SITE_OTHER): Payer: Medicare Other | Admitting: Internal Medicine

## 2020-11-21 ENCOUNTER — Other Ambulatory Visit: Payer: Self-pay

## 2020-11-21 VITALS — BP 122/74 | HR 62 | Temp 97.8°F | Resp 18 | Ht 66.0 in

## 2020-11-21 DIAGNOSIS — I2510 Atherosclerotic heart disease of native coronary artery without angina pectoris: Secondary | ICD-10-CM

## 2020-11-21 DIAGNOSIS — G8929 Other chronic pain: Secondary | ICD-10-CM | POA: Diagnosis not present

## 2020-11-21 DIAGNOSIS — M549 Dorsalgia, unspecified: Secondary | ICD-10-CM | POA: Diagnosis not present

## 2020-11-21 DIAGNOSIS — I10 Essential (primary) hypertension: Secondary | ICD-10-CM

## 2020-11-21 NOTE — Progress Notes (Signed)
Subjective:    Patient ID: [name], female    DOB: 05-12-1930, 87 y.o.   MRN: [id number]  DOS:  11/21/2020 Type of visit - description: Follow-up  Since the last office visit, she was evaluated by Dr. Rolena Infante at [facility], she was admitted to the hospital for  a T11, L1 compression fracture treatment after she failed outpatient treatment. Kyphoplasty x 2 were performed   Review of Systems Since the procedure, she is doing okay.  Pain has decreased, she  still requires hydrocodone a couple times a day at least.   Past Medical History:  Diagnosis Date   CAD (coronary artery disease)    mild nonobstructive disease on cath in 2003   Cancer [facility])    Cardiomyopathy    Probable Takotsubo, severe CP w/ normal cath in 1994. Severe CP in 2003 w/ widespread T wave inversions on ECG. Cath w/ minimal coronary disease but LV-gram showed periapical severe hypokinesis and basilar hyperkinesia (EF 40%). Last echo in 4/09 confirmed full LV functional recovery with EF 60%, no regional wall motion abnormalities, mild to moderate LVH.   Chronic diastolic CHF (congestive heart failure) ([place]) 09/13/2019   CVA (cerebral infarction)    Depression with anxiety 03-29-11   lost husband 3'09   Diverticulitis    DVT (deep venous thrombosis) ([place])    after venous ablation, R leg   Dyspnea    E. coli gastroenteritis 03-29-11   8'10   Fracture 09/10/07   L2, status post vertebroplasty of L2 performed by IR   GERD (gastroesophageal reflux disease)    Headache(784.0)    migraines   Hemorrhoids    Hiatal hernia 03-29-11   no nerve problems   Hyperlipidemia    Hyperplastic colon polyp 06/2001   Hypertension    Iron deficiency anemia due to chronic blood loss 12/30/2017   OA (osteoarthritis) of knee 03-29-11   w/ bilateral knee pain-not a problem now   OSA (obstructive sleep apnea) 03-29-11   no cpap used, not a problem now.   Osteoporosis    Otalgia of both ears    Dr. Simeon Craft   Polycythemia     Stroke [facility]) 03-29-11   CVA x2 -last 10'12-?TIA(visual problems)   Varicose vein     Past Surgical History:  Procedure Laterality Date   APPENDECTOMY     BACK SURGERY     BREAST BIOPSY     BREAST CYST EXCISION     BREAST LUMPECTOMY Left 2013   left stage I left breast cancer   CHOLECYSTECTOMY  04/09/2011   Procedure: LAPAROSCOPIC CHOLECYSTECTOMY WITH INTRAOPERATIVE CHOLANGIOGRAM;  Surgeon: Odis Hollingshead, MD;  Location: [facility];  Service: General;  Laterality: N/A;   Dental Extraction     L maxillary molar   INTRAMEDULLARY (IM) NAIL INTERTROCHANTERIC Left 12/25/2019   Procedure: INTRAMEDULLARY (IM) NAIL INTERTROCHANTRIC;  Surgeon: Paralee Cancel, MD;  Location: [facility];  Service: Orthopedics;  Laterality: Left;   IR KYPHO THORACIC WITH BONE BIOPSY  09/15/2019   IR KYPHO THORACIC WITH BONE BIOPSY  07/13/2020       IR KYPHO THORACIC WITH BONE BIOPSY  07/13/2020   IR VERTEBROPLASTY CERV/THOR BX INC UNI/BIL INC/INJECT/IMAGING  11/10/2019   KYPHOPLASTY N/A 11/16/2020   Procedure: T11,  L1,KYPHOPLASTY;  Surgeon: Melina Schools, MD;  Location: [facility];  Service: Orthopedics;  Laterality: N/A;   KYPHOSIS SURGERY  08/2007   cement used   OVARIAN CYST SURGERY     left   RADIOLOGY WITH ANESTHESIA  N/A 11/10/2019   Procedure: VERTEBROPLASTY;  Surgeon: Luanne Bras, MD;  Location: [place];  Service: Radiology;  Laterality: N/A;   TONSILLECTOMY     TOTAL KNEE ARTHROPLASTY  06/2010   left   TUBAL LIGATION      Allergies as of 11/21/2020       Reactions   Penicillins Hives, Rash, Other (See Comments)   Has patient had a PCN reaction causing immediate rash, facial/tongue/throat swelling, SOB or lightheadedness with hypotension: Yes Has patient had a PCN reaction causing severe rash involving mucus membranes or skin necrosis: yes Has patient had a PCN reaction that required hospitalization: No Has patient had a PCN reaction occurring within the last 10 years: no If all of the above answers are "NO",  then may proceed with Cephalosporin use. Other Reaction: OTHER REACTION   Valsartan Itching, Other (See Comments)   Atorvastatin Diarrhea   Carvedilol Other (See Comments)   "teeth hurt when I took it"   Ezetimibe Nausea Only   Fluvastatin Sodium Other (See Comments)   "Can't remember"   Magnesium Hydroxide Other (See Comments)   REACTION: triggers HAs   Meloxicam Other (See Comments)   Pt seeing auro's / spots.     Pneumovax [pneumococcal Polysaccharide Vaccine] Other (See Comments)   Local reaction   Quinapril Hcl Other (See Comments)    "feelings of tiredness"   Simvastatin Diarrhea   Topamax [topiramate] Itching   Vit D-vit E-safflower Oil Other (See Comments)   Headaches        Medication List        Accurate as of November 21, 2020 11:59 PM. If you have any questions, ask your nurse or doctor.          STOP taking these medications    furosemide 20 MG tablet Commonly known as: Lasix Stopped by: Kathlene November, MD       TAKE these medications    alendronate 70 MG tablet Commonly known as: FOSAMAX Take 1 tablet (70 mg total) by mouth every 7 (seven) days. Take with a full glass of water on an empty stomach.   bismuth subsalicylate 619 JK/93OI suspension Commonly known as: PEPTO BISMOL Take 30 mLs by mouth every 4 (four) hours as needed for indigestion (heartburn).   calcium carbonate 500 MG chewable tablet Commonly known as: TUMS - dosed in mg elemental calcium Chew 1-2 tablets by mouth daily as needed for indigestion or heartburn.   HYDROcodone-acetaminophen 5-325 MG tablet Commonly known as: NORCO/VICODIN Take 1 tablet by mouth 3 (three) times daily as needed for severe pain.   hydroxyurea 500 MG capsule Commonly known as: HYDREA TAKE 1 CAPSULE BY MOUTH EVERY DAY. MAY TAKE WITH FOOD TO MINIMIZE GI SIDE EFFECTS   lactose free nutrition Liqd Take 237 mLs by mouth daily.   LORazepam 0.5 MG tablet Commonly known as: ATIVAN 1/2 tab in AM, 1/2 tab PM, 1/2  to 1 tab hs prn What changed:  how much to take how to take this when to take this additional instructions   metoprolol tartrate 50 MG tablet Commonly known as: LOPRESSOR Take 1 tablet (50 mg total) by mouth 2 (two) times daily.   nitroGLYCERIN 0.4 MG SL tablet Commonly known as: NITROSTAT Place 1 tablet (0.4 mg total) under the tongue every 5 (five) minutes x 3 doses as needed for chest pain.   omeprazole 20 MG capsule Commonly known as: PRILOSEC Take 1 capsule (20 mg total) by mouth daily. What changed: when to take  this   ondansetron 4 MG tablet Commonly known as: Zofran Take 1 tablet (4 mg total) by mouth every 8 (eight) hours as needed for up to 5 days for nausea or vomiting.   polyethylene glycol 17 g packet Commonly known as: MIRALAX / GLYCOLAX Take 17 g by mouth daily as needed for moderate constipation.   senna-docusate 8.6-50 MG tablet Commonly known as: Senokot-S Take 1 tablet by mouth 2 (two) times daily.           Objective:   Physical Exam BP 122/74 (BP Location: Left Arm, Patient Position: Sitting, Cuff Size: Small)   Pulse 62   Temp 97.8 F (36.6 C) (Oral)   Resp 18   Ht 5\' 6"  (1.676 m)   SpO2 95%   BMI 17.75 kg/m  General:   Well developed, NAD, BMI noted. HEENT:  Normocephalic . Face symmetric, atraumatic Lungs:  decreased breath sounds Normal respiratory effort, no intercostal retractions, no accessory muscle use. Heart: RRR,  no murmur.  Lower extremities: no pretibial edema bilaterally  Skin: At the areas of the kyphoplasties she has Band-Aids, they were removed, she has a minimal amount of dry scabs.  No redness, swelling, discharge. Neurologic:  alert & oriented X3.  Speech normal, gait appropriate for age and unassisted Psych--  Cognition and judgment appear intact.  Cooperative with normal attention span and concentration.  Behavior appropriate. No anxious or depressed appearing.      Assessment       Assessment HTN Dyslipidemia: Intolerant to most medicines including Zetia, Pravachol, Niaspan Depression, anxiety , insomnia - on ativan bid (03-2015 citalopram cause drowsiness, 04-2015 prozac:diarrhea) Hem-Onc: POLYCYTEMIA: sees hem-onc H/o breast ca MSK: ---DJD  ---Osteoporosis:  T score -2.8 (02-2013): declined treatment 06-2017 ---L2 fractures, s/p vertebral plasty 2009, s/p vertebral plasty T 8 07/2020 Migraine headaches - Tylenol #3 (rarely) GI: GERD- intolerant to prevacid, visual disturbances HH, diverticulitis, Colon polyps h/o "functional " LLQ abd pain CV: ---Cardiomyopathy Probable Takotsubo cardiomyopathy: CP w/ normal cath in 1994.   Severe CP  2003 with widespread T wave inversions on ECG.  Cath with minimal coronary disease but LV-gram showed periapical severe hypokinesis and basilar hyperkinesia (EF 40%) >>> ECHO 4/09 confirmed full LV functional recovery with EF 60%, mild LVH. 2012: Nl EF per echo, (-) Lexiscan 05/21/18 ECHO Nl/stable ---Stroke / TIA 2012 ---ICB seizure 10/2019 was rx keppra, self d/c 05/2019 Sleep apnea 2012, , not on CPAP H/oDVT, right leg after venous ablation Raynaud phenomena, DX 05/2018   PLAN Back pain-chronic, right hip pain: Since the last office visit, she saw orthopedics and Dr. Rolena Infante, she underwent  kyphoplasty x2 , overall pain has decreased.  She is still taking hydrocodone on average twice daily. Bandages were removed, no obvious infection, recommend to change bandages carefully daily. Continue hydrocodone. Celebrex was recommended to be stopped. HTN: Well-controlled, continue metoprolol, not taking Lasix. RTC 2 to 3 months   This visit occurred during the SARS-CoV-2 public health emergency.  Safety protocols were in place, including screening questions prior to the visit, additional usage of staff PPE, and extensive cleaning of exam room while observing appropriate contact time as indicated for disinfecting solutions.

## 2020-11-21 NOTE — Patient Instructions (Addendum)
I am glad you are feeling better  Please change the 4 Band-Aids daily.  They need to be removed very slowly because your   skin is thin    GO TO THE FRONT DESK, PLEASE SCHEDULE YOUR APPOINTMENTS Come back for a checkup in 2 to 3 months

## 2020-11-22 NOTE — Assessment & Plan Note (Signed)
Back pain-chronic, right hip pain: Since the last office visit, she saw orthopedics and Dr. Rolena Infante, she underwent  kyphoplasty x2 , overall pain has decreased.  She is still taking hydrocodone on average twice daily. Bandages were removed, no obvious infection, recommend to change bandages carefully daily. Continue hydrocodone. Celebrex was recommended to be stopped. HTN: Well-controlled, continue metoprolol, not taking Lasix. RTC 2 to 3 months

## 2020-11-29 ENCOUNTER — Other Ambulatory Visit: Payer: Self-pay | Admitting: Hematology & Oncology

## 2020-11-29 DIAGNOSIS — D751 Secondary polycythemia: Secondary | ICD-10-CM

## 2020-11-29 DIAGNOSIS — Z4889 Encounter for other specified surgical aftercare: Secondary | ICD-10-CM | POA: Diagnosis not present

## 2020-11-30 ENCOUNTER — Encounter: Payer: Self-pay | Admitting: Hematology & Oncology

## 2020-11-30 ENCOUNTER — Telehealth: Payer: Self-pay

## 2020-11-30 MED ORDER — HYDROCODONE-ACETAMINOPHEN 5-325 MG PO TABS: 1.0000 | ORAL_TABLET | Freq: Two times a day (BID) | ORAL | 0 refills | Status: DC

## 2020-11-30 NOTE — Telephone Encounter (Signed)
At the last visit, she said that she is taking on average 1 tablet twice daily.  We will send a new Rx with #60.

## 2020-11-30 NOTE — Telephone Encounter (Signed)
Requesting: Norco  Contract:  04/27/2020 UDS: 04/27/2020 Last Visit: 11/21/2020 Next Visit:  02/13/2021 Last Refill: 11/07/2020  Please Advise

## 2020-12-01 ENCOUNTER — Encounter: Payer: Self-pay | Admitting: Hematology & Oncology

## 2020-12-01 ENCOUNTER — Inpatient Hospital Stay: Payer: Medicare Other | Attending: Hematology & Oncology

## 2020-12-01 ENCOUNTER — Inpatient Hospital Stay (HOSPITAL_BASED_OUTPATIENT_CLINIC_OR_DEPARTMENT_OTHER): Payer: Medicare Other | Admitting: Hematology & Oncology

## 2020-12-01 ENCOUNTER — Telehealth: Payer: Self-pay

## 2020-12-01 ENCOUNTER — Inpatient Hospital Stay: Payer: Medicare Other

## 2020-12-01 ENCOUNTER — Other Ambulatory Visit: Payer: Self-pay

## 2020-12-01 VITALS — BP 122/69 | HR 68 | Temp 98.1°F | Resp 16 | Wt 108.0 lb

## 2020-12-01 DIAGNOSIS — D751 Secondary polycythemia: Secondary | ICD-10-CM

## 2020-12-01 DIAGNOSIS — D509 Iron deficiency anemia, unspecified: Secondary | ICD-10-CM | POA: Insufficient documentation

## 2020-12-01 DIAGNOSIS — C50912 Malignant neoplasm of unspecified site of left female breast: Secondary | ICD-10-CM | POA: Insufficient documentation

## 2020-12-01 DIAGNOSIS — D45 Polycythemia vera: Secondary | ICD-10-CM | POA: Diagnosis not present

## 2020-12-01 DIAGNOSIS — D500 Iron deficiency anemia secondary to blood loss (chronic): Secondary | ICD-10-CM

## 2020-12-01 LAB — CBC WITH DIFFERENTIAL (CANCER CENTER ONLY)
Abs Immature Granulocytes: 0.02 10*3/uL (ref 0.00–0.07)
Basophils Absolute: 0 10*3/uL (ref 0.0–0.1)
Basophils Relative: 1 %
Eosinophils Absolute: 0 10*3/uL (ref 0.0–0.5)
Eosinophils Relative: 1 %
HCT: 42.6 % (ref 36.0–46.0)
Hemoglobin: 14.4 g/dL (ref 12.0–15.0)
Immature Granulocytes: 1 %
Lymphocytes Relative: 10 %
Lymphs Abs: 0.4 10*3/uL — ABNORMAL LOW (ref 0.7–4.0)
MCH: 37.4 pg — ABNORMAL HIGH (ref 26.0–34.0)
MCHC: 33.8 g/dL (ref 30.0–36.0)
MCV: 110.6 fL — ABNORMAL HIGH (ref 80.0–100.0)
Monocytes Absolute: 0.4 10*3/uL (ref 0.1–1.0)
Monocytes Relative: 11 %
Neutro Abs: 3.1 10*3/uL (ref 1.7–7.7)
Neutrophils Relative %: 76 %
Platelet Count: 199 10*3/uL (ref 150–400)
RBC: 3.85 MIL/uL — ABNORMAL LOW (ref 3.87–5.11)
RDW: 15.1 % (ref 11.5–15.5)
WBC Count: 4 10*3/uL (ref 4.0–10.5)
nRBC: 0 % (ref 0.0–0.2)

## 2020-12-01 LAB — CMP (CANCER CENTER ONLY)
ALT: 28 U/L (ref 0–44)
AST: 31 U/L (ref 15–41)
Albumin: 4 g/dL (ref 3.5–5.0)
Alkaline Phosphatase: 73 U/L (ref 38–126)
Anion gap: 5 (ref 5–15)
BUN: 12 mg/dL (ref 8–23)
CO2: 33 mmol/L — ABNORMAL HIGH (ref 22–32)
Calcium: 9.7 mg/dL (ref 8.9–10.3)
Chloride: 99 mmol/L (ref 98–111)
Creatinine: 0.81 mg/dL (ref 0.44–1.00)
GFR, Estimated: >60 mL/min (ref >60)
Glucose, Bld: 120 mg/dL — ABNORMAL HIGH (ref 70–99)
Potassium: 4.1 mmol/L (ref 3.5–5.1)
Sodium: 137 mmol/L (ref 135–145)
Total Bilirubin: 1.1 mg/dL (ref 0.3–1.2)
Total Protein: 6.6 g/dL (ref 6.5–8.1)

## 2020-12-01 LAB — LACTATE DEHYDROGENASE: LDH: 146 U/L (ref 98–192)

## 2020-12-01 MED ORDER — MEGESTROL ACETATE 400 MG/10ML PO SUSP: 400.0000 mg | Freq: Two times a day (BID) | ORAL | 2 refills | Status: DC

## 2020-12-01 NOTE — Progress Notes (Signed)
Hematology and Oncology Follow Up Visit  [name] [id number] Feb 18, 1930 87 y.o. 12/01/2020   Principle Diagnosis:  Polycythemia vera - JAK2 positive Stage [name] (T1bN0M0 ) infiltrating duct carcinoma the left breast  Iron deficiency anemia -- symptomatic   Past Therapy: Femara 2.5 mg by mouth daily - stopped 08/2015   Current Therapy:        Phlebotomy to maintain hematocrit below 45% Hydrea 500 mg by mouth daily -- re-started on 07/16/2019 IV Iron as needed   Interim History: [name] is here today for follow-up.  Unfortunately, [name] has had some problems.  [name] fell in early July.  [name] has compression fractures in [name] back.  [name] ultimately had to be in the hospital and have kyphoplasty at T11 and L1.  [name] does feel better.  [name] is still at [name] home.  [name] does see orthopedic surgery to follow-up with [name] back.  [name] is still doing Hydrea.  [name] is not have any problems with this.  [name] appetite is poor.  [name] just does not want to eat much.  As such, we probably need to get [name] on something for [name] appetite.  [name] will call in some Megace elixir.  [name] has had no bleeding.  [name] has had no fever.  [name] has had no cough.  [name] comes in wheelchair.  Thankfully, [name] has a Psychologist, occupational who helps [name] out.  Overall, [name] would say [name] performance status is by ECOG 3.     Medications:  Allergies as of 12/01/2020       Reactions   Penicillins Hives, Rash, Other (See Comments)   Has patient had a PCN reaction causing immediate rash, facial/tongue/throat swelling, SOB or lightheadedness with hypotension: Yes Has patient had a PCN reaction causing severe rash involving mucus membranes or skin necrosis: yes Has patient had a PCN reaction that required hospitalization: No Has patient had a PCN reaction occurring within the last 10 years: no If all of the above answers are "NO", then may proceed with Cephalosporin use. Other Reaction: OTHER REACTION   Valsartan Itching, Other (See Comments)    Atorvastatin Diarrhea   Carvedilol Other (See Comments)   "teeth hurt when [name] took it"   Ezetimibe Nausea Only   Fluvastatin Sodium Other (See Comments)   "Can't remember"   Magnesium Hydroxide Other (See Comments)   REACTION: triggers HAs   Meloxicam Other (See Comments)   Pt seeing auro's / spots.     Pneumovax [pneumococcal Polysaccharide Vaccine] Other (See Comments)   Local reaction   Quinapril Hcl Other (See Comments)    "feelings of tiredness"   Simvastatin Diarrhea   Topamax [topiramate] Itching   Vit D-vit E-safflower Oil Other (See Comments)   Headaches        Medication List        Accurate as of December 01, 2020 11:11 AM. If you have any questions, ask your nurse or doctor.          alendronate 70 MG tablet Commonly known as: FOSAMAX Take 1 tablet (70 mg total) by mouth every 7 (seven) days. Take with a full glass of water on an empty stomach.   bismuth subsalicylate 086 VH/84ON suspension Commonly known as: PEPTO BISMOL Take 30 mLs by mouth every 4 (four) hours as needed for indigestion (heartburn).   calcium carbonate 500 MG chewable tablet Commonly known as: TUMS - dosed in mg elemental calcium Chew 1-2 tablets by mouth daily as needed for indigestion or heartburn.  HYDROcodone-acetaminophen 5-325 MG tablet Commonly known as: NORCO/VICODIN Take 1 tablet by mouth 2 (two) times daily.   hydroxyurea 500 MG capsule Commonly known as: HYDREA TAKE 1 CAPSULE BY MOUTH EVERY DAY. MAY TAKE WITH FOOD TO MINIMIZE GI SIDE EFFECTS   lactose free nutrition Liqd Take 237 mLs by mouth daily.   LORazepam 0.5 MG tablet Commonly known as: ATIVAN 1/2 tab in AM, 1/2 tab PM, 1/2 to 1 tab hs prn What changed:  how much to take how to take this when to take this additional instructions   metoprolol tartrate 50 MG tablet Commonly known as: LOPRESSOR Take 1 tablet (50 mg total) by mouth 2 (two) times daily.   nitroGLYCERIN 0.4 MG SL tablet Commonly known as:  NITROSTAT Place 1 tablet (0.4 mg total) under the tongue every 5 (five) minutes x 3 doses as needed for chest pain.   omeprazole 20 MG capsule Commonly known as: PRILOSEC Take 1 capsule (20 mg total) by mouth daily. What changed: when to take this   polyethylene glycol 17 g packet Commonly known as: MIRALAX / GLYCOLAX Take 17 g by mouth daily as needed for moderate constipation.   senna-docusate 8.6-50 MG tablet Commonly known as: Senokot-S Take 1 tablet by mouth 2 (two) times daily.        Allergies:  Allergies  Allergen Reactions   Penicillins Hives, Rash and Other (See Comments)    Has patient had a PCN reaction causing immediate rash, facial/tongue/throat swelling, SOB or lightheadedness with hypotension: Yes Has patient had a PCN reaction causing severe rash involving mucus membranes or skin necrosis: yes Has patient had a PCN reaction that required hospitalization: No Has patient had a PCN reaction occurring within the last 10 years: no If all of the above answers are "NO", then may proceed with Cephalosporin use.  Other Reaction: OTHER REACTION   Valsartan Itching and Other (See Comments)   Atorvastatin Diarrhea   Carvedilol Other (See Comments)    "teeth hurt when [name] took it"    Ezetimibe Nausea Only   Fluvastatin Sodium Other (See Comments)    "Can't remember"   Magnesium Hydroxide Other (See Comments)    REACTION: triggers HAs   Meloxicam Other (See Comments)    Pt seeing auro's / spots.     Pneumovax [Pneumococcal Polysaccharide Vaccine] Other (See Comments)    Local reaction   Quinapril Hcl Other (See Comments)     "feelings of tiredness"   Simvastatin Diarrhea   Topamax [Topiramate] Itching   Vit D-Vit E-Safflower Oil Other (See Comments)    Headaches    Past Medical History, Surgical history, Social history, and Family History were reviewed and updated.  Review of Systems: Review of Systems  Constitutional:  Positive for malaise/fatigue and weight  loss.  HENT: Negative.    Eyes: Negative.   Respiratory: Negative.    Cardiovascular: Negative.   Gastrointestinal: Negative.   Genitourinary: Negative.   Musculoskeletal:  Positive for back pain and joint pain.  Skin: Negative.   Neurological: Negative.   Endo/Heme/Allergies: Negative.   Psychiatric/Behavioral: Negative.      Physical Exam:  weight is 108 lb (49 kg). [name] oral temperature is 98.1 F (36.7 C). [name] blood pressure is 122/69 and [name] pulse is 68. [name] respiration is 16 and oxygen saturation is 100%.   Wt Readings from Last 3 Encounters:  12/01/20 108 lb (49 kg)  11/16/20 110 lb (49.9 kg)  10/03/20 113 lb (51.3 kg)    Physical Exam  Vitals reviewed.  HENT:     Head: Normocephalic and atraumatic.  Eyes:     Pupils: Pupils are equal, round, and reactive to light.  Cardiovascular:     Rate and Rhythm: Normal rate and regular rhythm.     Heart sounds: Normal heart sounds.  Pulmonary:     Effort: Pulmonary effort is normal.     Breath sounds: Normal breath sounds.  Abdominal:     General: Bowel sounds are normal.     Palpations: Abdomen is soft.  Musculoskeletal:        General: No tenderness or deformity. Normal range of motion.     Cervical back: Normal range of motion.  Lymphadenopathy:     Cervical: No cervical adenopathy.  Skin:    General: Skin is warm and dry.     Findings: No erythema or rash.  Neurological:     Mental Status: [name] is alert and oriented to person, place, and time.  Psychiatric:        Behavior: Behavior normal.        Thought Content: Thought content normal.        Judgment: Judgment normal.      Lab Results  Component Value Date   WBC 4.0 12/01/2020   HGB 14.4 12/01/2020   HCT 42.6 12/01/2020   MCV 110.6 (H) 12/01/2020   PLT 199 12/01/2020   Lab Results  Component Value Date   FERRITIN 454 (H) 10/19/2020   IRON 66 10/19/2020   TIBC 279 10/19/2020   UIBC 213 10/19/2020   IRONPCTSAT 24 10/19/2020   Lab Results   Component Value Date   RETICCTPCT 0.8 02/02/2015   RBC 3.85 (L) 12/01/2020   RETICCTABS 41.4 02/02/2015   No results found for: KPAFRELGTCHN, LAMBDASER, KAPLAMBRATIO No results found for: Kandis Cocking, IGMSERUM No results found for: Odetta Pink, SPEI   Chemistry      Component Value Date/Time   NA 137 12/01/2020 1010   NA 142 03/25/2017 1457   NA 139 04/10/2016 0855   K 4.1 12/01/2020 1010   K 4.1 03/25/2017 1457   K 4.2 04/10/2016 0855   CL 99 12/01/2020 1010   CL 96 (L) 03/25/2017 1457   CO2 33 (H) 12/01/2020 1010   CO2 32 03/25/2017 1457   CO2 29 04/10/2016 0855   BUN 12 12/01/2020 1010   BUN 7 03/25/2017 1457   BUN 10.0 04/10/2016 0855   CREATININE 0.81 12/01/2020 1010   CREATININE 0.55 (L) 04/22/2020 1444   CREATININE 0.8 04/10/2016 0855      Component Value Date/Time   CALCIUM 9.7 12/01/2020 1010   CALCIUM 9.8 03/25/2017 1457   CALCIUM 9.5 04/10/2016 0855   ALKPHOS 73 12/01/2020 1010   ALKPHOS 86 (H) 03/25/2017 1457   ALKPHOS 85 04/10/2016 0855   AST 31 12/01/2020 1010   AST 19 04/10/2016 0855   ALT 28 12/01/2020 1010   ALT 15 03/25/2017 1457   ALT 13 04/10/2016 0855   BILITOT 1.1 12/01/2020 1010   BILITOT 1.26 (H) 04/10/2016 0855       Impression and Plan: [name] is a very pleasant 87 yo caucasian female with polycythemia and history of stage [name] infiltrating ductal carcinoma of the left breast with lumpectomy in 2013 as well as polycythemia, JAK2 positive.   [name] problem now is the compression fractures and the pain associated with them.  Thankfully, [name] did have the kyphoplasty.  Hopefully this will help.  Sound  like [name] might be taking some physical therapy.  [name] did not need to be phlebotomized today.  We will certainly try to move [name] appointments out a little bit more.  [name] know it is hard for [name] to come here.  Hopefully, the Megace will help with [name] appetite.  [name] would like to see [name]  back in another 3 months.   Volanda Napoleon, MD 7/21/202211:11 AM

## 2020-12-01 NOTE — Telephone Encounter (Signed)
Appts made and printed for pt per 12/01/20 los  [name]

## 2020-12-02 LAB — IRON AND TIBC
Iron: 95 ug/dL (ref 28–170)
Saturation Ratios: 34 % — ABNORMAL HIGH (ref 10.4–31.8)
TIBC: 279 ug/dL (ref 250–450)
UIBC: 184 ug/dL

## 2020-12-02 LAB — FERRITIN: Ferritin: 355 ng/mL — ABNORMAL HIGH (ref 11–307)

## 2020-12-09 DIAGNOSIS — M25552 Pain in left hip: Secondary | ICD-10-CM | POA: Diagnosis not present

## 2020-12-09 DIAGNOSIS — S72142A Displaced intertrochanteric fracture of left femur, initial encounter for closed fracture: Secondary | ICD-10-CM | POA: Diagnosis not present

## 2020-12-09 DIAGNOSIS — T8484XA Pain due to internal orthopedic prosthetic devices, implants and grafts, initial encounter: Secondary | ICD-10-CM | POA: Diagnosis not present

## 2020-12-20 ENCOUNTER — Other Ambulatory Visit: Payer: Self-pay | Admitting: Hematology & Oncology

## 2020-12-20 DIAGNOSIS — D751 Secondary polycythemia: Secondary | ICD-10-CM

## 2020-12-21 ENCOUNTER — Encounter: Payer: Self-pay | Admitting: Hematology & Oncology

## 2020-12-25 ENCOUNTER — Observation Stay (HOSPITAL_COMMUNITY)
Admission: EM | Admit: 2020-12-25 | Discharge: 2020-12-26 | Disposition: A | Discharge status: Home / Self Care | Payer: Medicare Other | Attending: Family Medicine | Admitting: Family Medicine

## 2020-12-25 ENCOUNTER — Emergency Department (HOSPITAL_COMMUNITY): Payer: Medicare Other

## 2020-12-25 ENCOUNTER — Encounter (HOSPITAL_COMMUNITY): Payer: Self-pay | Admitting: Internal Medicine

## 2020-12-25 DIAGNOSIS — Z96652 Presence of left artificial knee joint: Secondary | ICD-10-CM | POA: Diagnosis not present

## 2020-12-25 DIAGNOSIS — Z7901 Long term (current) use of anticoagulants: Secondary | ICD-10-CM | POA: Diagnosis not present

## 2020-12-25 DIAGNOSIS — E785 Hyperlipidemia, unspecified: Secondary | ICD-10-CM | POA: Diagnosis present

## 2020-12-25 DIAGNOSIS — I2699 Other pulmonary embolism without acute cor pulmonale: Secondary | ICD-10-CM | POA: Diagnosis not present

## 2020-12-25 DIAGNOSIS — Z87891 Personal history of nicotine dependence: Secondary | ICD-10-CM | POA: Insufficient documentation

## 2020-12-25 DIAGNOSIS — R0789 Other chest pain: Secondary | ICD-10-CM | POA: Diagnosis not present

## 2020-12-25 DIAGNOSIS — M25511 Pain in right shoulder: Secondary | ICD-10-CM | POA: Diagnosis not present

## 2020-12-25 DIAGNOSIS — Z853 Personal history of malignant neoplasm of breast: Secondary | ICD-10-CM | POA: Insufficient documentation

## 2020-12-25 DIAGNOSIS — I110 Hypertensive heart disease with heart failure: Secondary | ICD-10-CM | POA: Diagnosis not present

## 2020-12-25 DIAGNOSIS — M25519 Pain in unspecified shoulder: Secondary | ICD-10-CM | POA: Diagnosis not present

## 2020-12-25 DIAGNOSIS — R109 Unspecified abdominal pain: Secondary | ICD-10-CM | POA: Insufficient documentation

## 2020-12-25 DIAGNOSIS — D751 Secondary polycythemia: Secondary | ICD-10-CM | POA: Diagnosis present

## 2020-12-25 DIAGNOSIS — F341 Dysthymic disorder: Secondary | ICD-10-CM | POA: Diagnosis present

## 2020-12-25 DIAGNOSIS — S32502A Unspecified fracture of left pubis, initial encounter for closed fracture: Secondary | ICD-10-CM | POA: Diagnosis not present

## 2020-12-25 DIAGNOSIS — I2510 Atherosclerotic heart disease of native coronary artery without angina pectoris: Secondary | ICD-10-CM | POA: Diagnosis not present

## 2020-12-25 DIAGNOSIS — M4856XA Collapsed vertebra, not elsewhere classified, lumbar region, initial encounter for fracture: Secondary | ICD-10-CM | POA: Diagnosis not present

## 2020-12-25 DIAGNOSIS — D7389 Other diseases of spleen: Secondary | ICD-10-CM | POA: Diagnosis not present

## 2020-12-25 DIAGNOSIS — S32501A Unspecified fracture of right pubis, initial encounter for closed fracture: Secondary | ICD-10-CM | POA: Diagnosis not present

## 2020-12-25 DIAGNOSIS — Z8673 Personal history of transient ischemic attack (TIA), and cerebral infarction without residual deficits: Secondary | ICD-10-CM | POA: Insufficient documentation

## 2020-12-25 DIAGNOSIS — I5032 Chronic diastolic (congestive) heart failure: Secondary | ICD-10-CM | POA: Diagnosis not present

## 2020-12-25 DIAGNOSIS — R0902 Hypoxemia: Secondary | ICD-10-CM | POA: Diagnosis not present

## 2020-12-25 DIAGNOSIS — Z79899 Other long term (current) drug therapy: Secondary | ICD-10-CM | POA: Insufficient documentation

## 2020-12-25 DIAGNOSIS — K449 Diaphragmatic hernia without obstruction or gangrene: Secondary | ICD-10-CM | POA: Diagnosis not present

## 2020-12-25 DIAGNOSIS — I517 Cardiomegaly: Secondary | ICD-10-CM | POA: Diagnosis not present

## 2020-12-25 DIAGNOSIS — I10 Essential (primary) hypertension: Secondary | ICD-10-CM | POA: Diagnosis present

## 2020-12-25 DIAGNOSIS — Z20822 Contact with and (suspected) exposure to covid-19: Secondary | ICD-10-CM | POA: Diagnosis not present

## 2020-12-25 DIAGNOSIS — R079 Chest pain, unspecified: Secondary | ICD-10-CM | POA: Diagnosis not present

## 2020-12-25 LAB — COMPREHENSIVE METABOLIC PANEL
ALT: 28 U/L (ref 0–44)
AST: 27 U/L (ref 15–41)
Albumin: 3.5 g/dL (ref 3.5–5.0)
Alkaline Phosphatase: 59 U/L (ref 38–126)
Anion gap: 6 (ref 5–15)
BUN: 15 mg/dL (ref 8–23)
CO2: 28 mmol/L (ref 22–32)
Calcium: 8.9 mg/dL (ref 8.9–10.3)
Chloride: 99 mmol/L (ref 98–111)
Creatinine, Ser: 0.66 mg/dL (ref 0.44–1.00)
GFR, Estimated: >60 mL/min (ref >60)
Glucose, Bld: 129 mg/dL — ABNORMAL HIGH (ref 70–99)
Potassium: 3.7 mmol/L (ref 3.5–5.1)
Sodium: 133 mmol/L — ABNORMAL LOW (ref 135–145)
Total Bilirubin: 2.4 mg/dL — ABNORMAL HIGH (ref 0.3–1.2)
Total Protein: 6.1 g/dL — ABNORMAL LOW (ref 6.5–8.1)

## 2020-12-25 LAB — CBC WITH DIFFERENTIAL/PLATELET
Abs Immature Granulocytes: 0.02 10*3/uL (ref 0.00–0.07)
Basophils Absolute: 0 10*3/uL (ref 0.0–0.1)
Basophils Relative: 0 %
Eosinophils Absolute: 0 10*3/uL (ref 0.0–0.5)
Eosinophils Relative: 1 %
HCT: 37.8 % (ref 36.0–46.0)
Hemoglobin: 12.4 g/dL (ref 12.0–15.0)
Immature Granulocytes: 0 %
Lymphocytes Relative: 5 %
Lymphs Abs: 0.3 10*3/uL — ABNORMAL LOW (ref 0.7–4.0)
MCH: 37.8 pg — ABNORMAL HIGH (ref 26.0–34.0)
MCHC: 32.8 g/dL (ref 30.0–36.0)
MCV: 115.2 fL — ABNORMAL HIGH (ref 80.0–100.0)
Monocytes Absolute: 0.7 10*3/uL (ref 0.1–1.0)
Monocytes Relative: 12 %
Neutro Abs: 4.5 10*3/uL (ref 1.7–7.7)
Neutrophils Relative %: 82 %
Platelets: 168 10*3/uL (ref 150–400)
RBC: 3.28 MIL/uL — ABNORMAL LOW (ref 3.87–5.11)
RDW: 15.5 % (ref 11.5–15.5)
WBC: 5.5 10*3/uL (ref 4.0–10.5)
nRBC: 0 % (ref 0.0–0.2)

## 2020-12-25 LAB — TROPONIN I (HIGH SENSITIVITY)
Troponin I (High Sensitivity): 6 ng/L (ref <18)
Troponin I (High Sensitivity): 7 ng/L (ref <18)
Troponin I (High Sensitivity): 5 ng/L (ref <18)

## 2020-12-25 LAB — D-DIMER, QUANTITATIVE: D-Dimer, Quant: 0.78 ug/mL-FEU — ABNORMAL HIGH (ref 0.00–0.50)

## 2020-12-25 LAB — LIPASE, BLOOD: Lipase: 29 U/L (ref 11–51)

## 2020-12-25 MED ORDER — ALBUTEROL SULFATE (2.5 MG/3ML) 0.083% IN NEBU: 2.5000 mg | INHALATION_SOLUTION | RESPIRATORY_TRACT | Status: DC | PRN

## 2020-12-25 MED ORDER — METOPROLOL TARTRATE 25 MG PO TABS
50.0000 mg | ORAL_TABLET | Freq: Two times a day (BID) | ORAL | Status: DC
  Administered 2020-12-25 – 2020-12-26 (×2): 50 mg via ORAL
  Filled 2020-12-25 (×3): qty 2

## 2020-12-25 MED ORDER — ONDANSETRON HCL 4 MG PO TABS: 4.0000 mg | ORAL_TABLET | Freq: Four times a day (QID) | ORAL | Status: DC | PRN

## 2020-12-25 MED ORDER — APIXABAN 5 MG PO TABS: 5.0000 mg | ORAL_TABLET | Freq: Two times a day (BID) | ORAL | Status: DC

## 2020-12-25 MED ORDER — ACETAMINOPHEN 325 MG PO TABS
650.0000 mg | ORAL_TABLET | Freq: Four times a day (QID) | ORAL | Status: DC | PRN
  Administered 2020-12-26: 650 mg via ORAL
  Filled 2020-12-25: qty 2

## 2020-12-25 MED ORDER — HYDROXYUREA 500 MG PO CAPS
500.0000 mg | ORAL_CAPSULE | Freq: Every day | ORAL | Status: DC
  Administered 2020-12-26: 500 mg via ORAL
  Filled 2020-12-25: qty 1

## 2020-12-25 MED ORDER — CALCIUM CARBONATE ANTACID 500 MG PO CHEW
1.0000 | CHEWABLE_TABLET | Freq: Every day | ORAL | Status: DC | PRN
  Administered 2020-12-25 – 2020-12-26 (×2): 200 mg via ORAL
  Filled 2020-12-25: qty 2
  Filled 2020-12-25: qty 1

## 2020-12-25 MED ORDER — IOHEXOL 350 MG/ML SOLN
50.0000 mL | Freq: Once | INTRAVENOUS | Status: AC | PRN
  Administered 2020-12-25: 50 mL via INTRAVENOUS

## 2020-12-25 MED ORDER — HYDROCODONE-ACETAMINOPHEN 5-325 MG PO TABS
1.0000 | ORAL_TABLET | Freq: Two times a day (BID) | ORAL | Status: DC
  Administered 2020-12-25 – 2020-12-26 (×2): 1 via ORAL
  Filled 2020-12-25 (×2): qty 1

## 2020-12-25 MED ORDER — BISACODYL 5 MG PO TBEC: 5.0000 mg | DELAYED_RELEASE_TABLET | Freq: Every day | ORAL | Status: DC | PRN

## 2020-12-25 MED ORDER — POLYETHYLENE GLYCOL 3350 17 G PO PACK: 17.0000 g | PACK | Freq: Every day | ORAL | Status: DC | PRN

## 2020-12-25 MED ORDER — LORAZEPAM 1 MG PO TABS
0.5000 mg | ORAL_TABLET | Freq: Three times a day (TID) | ORAL | Status: DC
  Administered 2020-12-25 – 2020-12-26 (×3): 0.5 mg via ORAL
  Filled 2020-12-25 (×3): qty 1

## 2020-12-25 MED ORDER — APIXABAN 5 MG PO TABS
10.0000 mg | ORAL_TABLET | Freq: Two times a day (BID) | ORAL | Status: DC
  Administered 2020-12-25 – 2020-12-26 (×2): 10 mg via ORAL
  Filled 2020-12-25 (×3): qty 2

## 2020-12-25 MED ORDER — ONDANSETRON HCL 4 MG/2ML IJ SOLN: 4.0000 mg | Freq: Four times a day (QID) | INTRAMUSCULAR | Status: DC | PRN

## 2020-12-25 MED ORDER — DOCUSATE SODIUM 100 MG PO CAPS
100.0000 mg | ORAL_CAPSULE | Freq: Two times a day (BID) | ORAL | Status: DC
  Administered 2020-12-25 – 2020-12-26 (×2): 100 mg via ORAL
  Filled 2020-12-25 (×2): qty 1

## 2020-12-25 MED ORDER — SODIUM CHLORIDE 0.9% FLUSH
3.0000 mL | Freq: Two times a day (BID) | INTRAVENOUS | Status: DC
  Administered 2020-12-25 – 2020-12-26 (×2): 3 mL via INTRAVENOUS

## 2020-12-25 MED ORDER — PANTOPRAZOLE SODIUM 40 MG PO TBEC
40.0000 mg | DELAYED_RELEASE_TABLET | Freq: Two times a day (BID) | ORAL | Status: DC
  Administered 2020-12-25 – 2020-12-26 (×2): 40 mg via ORAL
  Filled 2020-12-25 (×2): qty 1

## 2020-12-25 MED ORDER — ACETAMINOPHEN 650 MG RE SUPP: 650.0000 mg | Freq: Four times a day (QID) | RECTAL | Status: DC | PRN

## 2020-12-25 MED ORDER — HYDRALAZINE HCL 20 MG/ML IJ SOLN: 5.0000 mg | INTRAMUSCULAR | Status: DC | PRN

## 2020-12-25 MED ORDER — BISMUTH SUBSALICYLATE 262 MG/15ML PO SUSP
30.0000 mL | ORAL | Status: DC | PRN
  Filled 2020-12-25: qty 236

## 2020-12-25 MED ORDER — LORAZEPAM 1 MG PO TABS
0.5000 mg | ORAL_TABLET | Freq: Once | ORAL | Status: AC
  Administered 2020-12-25: 0.5 mg via ORAL
  Filled 2020-12-25: qty 1

## 2020-12-25 MED ORDER — MORPHINE SULFATE (PF) 2 MG/ML IV SOLN: 2.0000 mg | INTRAVENOUS | Status: DC | PRN

## 2020-12-25 MED ORDER — ACETAMINOPHEN 500 MG PO TABS
1000.0000 mg | ORAL_TABLET | Freq: Once | ORAL | Status: AC
  Administered 2020-12-25: 1000 mg via ORAL
  Filled 2020-12-25: qty 2

## 2020-12-25 NOTE — Progress Notes (Signed)
ANTICOAGULATION CONSULT NOTE - Initial Consult  Pharmacy Consult for Apixaban Indication: pulmonary embolus  Allergies  Allergen Reactions   Penicillins Hives, Rash and Other (See Comments)    Has patient had a PCN reaction causing immediate rash, facial/tongue/throat swelling, SOB or lightheadedness with hypotension: Yes Has patient had a PCN reaction causing severe rash involving mucus membranes or skin necrosis: yes Has patient had a PCN reaction that required hospitalization: No Has patient had a PCN reaction occurring within the last 10 years: no If all of the above answers are "NO", then may proceed with Cephalosporin use.  Other Reaction: OTHER REACTION   Valsartan Itching and Other (See Comments)   Atorvastatin Diarrhea   Carvedilol Other (See Comments)    "teeth hurt when I took it"    Ezetimibe Nausea Only   Fluvastatin Sodium Other (See Comments)    "Can't remember"   Magnesium Hydroxide Other (See Comments)    REACTION: triggers HAs   Meloxicam Other (See Comments)    Pt seeing auro's / spots.     Pneumovax [Pneumococcal Polysaccharide Vaccine] Other (See Comments)    Local reaction   Quinapril Hcl Other (See Comments)     "feelings of tiredness"   Simvastatin Diarrhea   Topamax [Topiramate] Itching   Vit D-Vit E-Safflower Oil Other (See Comments)    Headaches   Vital Signs: Temp: 98.6 F (37 C) (08/14 0540) BP: 131/63 (08/14 1345) Pulse Rate: 76 (08/14 1345)  Labs: Recent Labs    12/25/20 0545 12/25/20 1240  HGB 12.4  --   HCT 37.8  --   PLT 168  --   CREATININE 0.66  --   TROPONINIHS 6 7    CrCl cannot be calculated (Unknown ideal weight.).   Medical History: Past Medical History:  Diagnosis Date   CAD (coronary artery disease)    mild nonobstructive disease on cath in 2003   Cancer [facility])    Cardiomyopathy    Probable Takotsubo, severe CP w/ normal cath in 1994. Severe CP in 2003 w/ widespread T wave inversions on ECG. Cath w/ minimal  coronary disease but LV-gram showed periapical severe hypokinesis and basilar hyperkinesia (EF 40%). Last echo in 4/09 confirmed full LV functional recovery with EF 60%, no regional wall motion abnormalities, mild to moderate LVH.   Chronic diastolic CHF (congestive heart failure) ([place]) 09/13/2019   CVA (cerebral infarction)    Depression with anxiety 03-29-11   lost husband 3'09   Diverticulitis    DVT (deep venous thrombosis) ([place])    after venous ablation, R leg   Dyspnea    E. coli gastroenteritis 03-29-11   8'10   Fracture 09/10/07   L2, status post vertebroplasty of L2 performed by IR   GERD (gastroesophageal reflux disease)    Headache(784.0)    migraines   Hemorrhoids    Hiatal hernia 03-29-11   no nerve problems   Hyperlipidemia    Hyperplastic colon polyp 06/2001   Hypertension    Iron deficiency anemia due to chronic blood loss 12/30/2017   OA (osteoarthritis) of knee 03-29-11   w/ bilateral knee pain-not a problem now   OSA (obstructive sleep apnea) 03-29-11   no cpap used, not a problem now.   Osteoporosis    Otalgia of both ears    Dr. Simeon Craft   Polycythemia    Stroke [facility]) 03-29-11   CVA x2 -last 10'12-?TIA(visual problems)   Varicose vein     Medications:  (Not in a hospital admission)  Scheduled:  Infusions:   Assessment: 37 yof with history of CAD,CVA, DVT not on anticoagulation, CHF. Patient presenting to ED with acute onset right chest pain. CTA concerning for PE. Apixaban consult per pharmacy ordered.  Patient not on anticoagulation prior to arrival. D-dimer 0.78; Hgb 12.4; plt 168  Goal of Therapy:  Monitor platelets by anticoagulation protocol: Yes   Plan:  Apixaban '10mg'$  Bid x 7 days then 5 mg BID Patient will need education prior to discharge Monitor CBC, SCr, and for S/s of bleeding  Lorelei Pont, PharmD, BCPS 12/25/2020 2:47 PM ED Clinical Pharmacist -  [phone]

## 2020-12-25 NOTE — ED Notes (Signed)
Patient admitted to room 37.  No report received

## 2020-12-25 NOTE — ED Provider Notes (Signed)
Emergency Medicine Provider Triage Evaluation Note  [name] , a 87 y.o. female  was evaluated in triage.  Pt complains of right sided chest pain.  Pain woke her from sleep at 330am.  It radiates to her right shoulder.  .  Review of Systems  Positive: Chest pain Negative: Fever, vomiting  Physical Exam  BP 125/64   Pulse 75   Temp 98.7 F (37.1 C)   Resp 16   SpO2 100%  Gen:   Awake, no distress   Resp:  Normal effort  MSK:   Moves extremities without difficulty  Other:  Moderate epigastric tenderness  Medical Decision Making  Medically screening exam initiated at 5:28 AM.  Appropriate orders placed.  [name] was informed that the remainder of the evaluation will be completed by another provider, this initial triage assessment does not replace that evaluation, and the importance of remaining in the ED until their evaluation is complete.     Quintella Reichert, MD 12/25/20 [phone]

## 2020-12-25 NOTE — ED Notes (Signed)
Nurse notified of bp.

## 2020-12-25 NOTE — ED Triage Notes (Signed)
Pt in ED by EMS for R ribcage and shoulder pain, denies injury. Pt took hydrocodone pta with improvement of pain

## 2020-12-25 NOTE — Care Management (Signed)
Eligibility sent for DOAC including Lovenox. Will likely be resulted tomorrow.

## 2020-12-25 NOTE — ED Notes (Signed)
Pt expresses multiple generalized complaints, nausea, headache, shoulder pain. RN gave PO tylenol and ativan per order.

## 2020-12-25 NOTE — ED Notes (Signed)
I could not get a better EKG in triage pt will not stay still long enough

## 2020-12-25 NOTE — ED Provider Notes (Signed)
Emergency Department Provider Note   I have reviewed the triage vital signs and the nursing notes.   HISTORY  Chief Complaint Shoulder Pain   HPI [name] is a 87 y.o. female with past medical history reviewed below including prior CAD, CHF, prior stroke, not anticoagulated presents to the ED with acute onset right chest pain. Patient awoke at 3:30 AM to go to the bathroom. She had a sudden, severe twisting/sharp type pain in the right later chest and flank. She denies SOB or fever. No anterior or left sided pain. Triage note describes shoulder pain but patient denies this to me. She came in by EMS and took Vicodin PTA with some relief. She waited in the ED waiting room and since that time, pain is returning, although not a severe. She notes a [name] history of spine issues and falls but denies any falls recently. She lives at home alone.   Past Medical History:  Diagnosis Date   CAD (coronary artery disease)    mild nonobstructive disease on cath in 2003   Cancer [facility])    Cardiomyopathy    Probable Takotsubo, severe CP w/ normal cath in 1994. Severe CP in 2003 w/ widespread T wave inversions on ECG. Cath w/ minimal coronary disease but LV-gram showed periapical severe hypokinesis and basilar hyperkinesia (EF 40%). Last echo in 4/09 confirmed full LV functional recovery with EF 60%, no regional wall motion abnormalities, mild to moderate LVH.   Chronic diastolic CHF (congestive heart failure) ([place]) 09/13/2019   CVA (cerebral infarction)    Depression with anxiety 03/29/2011   lost husband 3'09   DVT (deep venous thrombosis) ([place])    after venous ablation, R leg   Fracture 09/10/2007   L2, status post vertebroplasty of L2 performed by IR   GERD (gastroesophageal reflux disease)    Hemorrhoids    Hiatal hernia 03/29/2011   no nerve problems   Hyperlipidemia    Hyperplastic colon polyp 06/2001   Hypertension    Iron deficiency anemia due to chronic blood loss 12/30/2017    OA (osteoarthritis) of knee 03/29/2011   w/ bilateral knee pain-not a problem now   OSA (obstructive sleep apnea) 03/29/2011   no cpap used, not a problem now.   Osteoporosis    Otalgia of both ears    Dr. Simeon Craft   Polycythemia    Stroke [facility]) 03/29/2011   CVA x2 -last 10'12-?TIA(visual problems)   Varicose vein     Patient Active Problem List   Diagnosis Date Noted   PE (pulmonary thromboembolism) ([place]) 12/25/2020   Lumbar compression fracture ([place]) 11/16/2020   UTI (urinary tract infection) 10/03/2020   Generalized weakness 10/03/2020   Otalgia of both ears    Hypertension    GERD (gastroesophageal reflux disease)    Dyspnea    DVT (deep venous thrombosis) ([facility])    Diverticulitis    Cancer ([facility])    CAD (coronary artery disease)    Acute midline thoracic back pain 03/04/2020   Malnutrition of moderate degree 12/27/2019   Pressure injury of skin 12/27/2019   S/p left hip fracture    Comminuted fracture of hip, left, open type I or II, initial encounter ([place]) 12/24/2019   Closed left hip fracture ([place]) 12/24/2019   ICH (intracerebral hemorrhage) ([place]) 11/04/2019   Intractable back pain 09/13/2019   Compression fracture of T9 vertebra ([place]) 09/13/2019   Chronic diastolic CHF (congestive heart failure) ([place]) 09/13/2019   Overweight (BMI 25.0-29.9) 09/13/2019   Constipation  09/13/2019   Raynaud's phenomenon without gangrene 05/19/2018   Iron deficiency anemia due to chronic blood loss 12/30/2017   Insomnia 06/25/2017   Mild CAD 05/24/2017   PCP NOTES >>> 02/15/2015   Superficial thrombophlebitis 04/06/2014   Annual physical exam 08/18/2012   Edema 04/14/2012   Breast cancer ([place]) 11/05/2011   Polycythemia 11/05/2011   Stroke ([place]) 03/29/2011   OSA (obstructive sleep apnea) 03/29/2011   OA (osteoarthritis) of knee 03/29/2011   Hiatal hernia 03/29/2011   E. coli gastroenteritis 03/29/2011   Depression with anxiety 03/29/2011   Chest pain 01/16/2011   TIA (transient  ischemic attack) 01/16/2011   CARDIOMYOPATHY 08/03/2009   DJD -- pain mgmt 08/16/2008   ABDOMINAL PAIN, LEFT LOWER QUADRANT 07/05/2008   GAIT DISTURBANCE 10/21/2007   ANXIETY DEPRESSION 10/08/2007   Fracture 09/10/2007   Hyperlipidemia 06/25/2007   Takotsubo cardiomyopathy 06/25/2007   GERD without esophagitis 06/25/2007   Essential hypertension 09/30/2006   Senile osteoporosis 09/30/2006   Migraine with aura 09/30/2006   Hyperplastic colon polyp 06/2001    Past Surgical History:  Procedure Laterality Date   APPENDECTOMY     BACK SURGERY     BREAST BIOPSY     BREAST CYST EXCISION     BREAST LUMPECTOMY Left 2013   left stage I left breast cancer   CHOLECYSTECTOMY  04/09/2011   Procedure: LAPAROSCOPIC CHOLECYSTECTOMY WITH INTRAOPERATIVE CHOLANGIOGRAM;  Surgeon: Odis Hollingshead, MD;  Location: [facility];  Service: General;  Laterality: N/A;   Dental Extraction     L maxillary molar   INTRAMEDULLARY (IM) NAIL INTERTROCHANTERIC Left 12/25/2019   Procedure: INTRAMEDULLARY (IM) NAIL INTERTROCHANTRIC;  Surgeon: Paralee Cancel, MD;  Location: [facility];  Service: Orthopedics;  Laterality: Left;   IR KYPHO THORACIC WITH BONE BIOPSY  09/15/2019   IR KYPHO THORACIC WITH BONE BIOPSY  07/13/2020       IR KYPHO THORACIC WITH BONE BIOPSY  07/13/2020   IR VERTEBROPLASTY CERV/THOR BX INC UNI/BIL INC/INJECT/IMAGING  11/10/2019   KYPHOPLASTY N/A 11/16/2020   Procedure: T11,  L1,KYPHOPLASTY;  Surgeon: Melina Schools, MD;  Location: [place];  Service: Orthopedics;  Laterality: N/A;   KYPHOSIS SURGERY  08/2007   cement used   OVARIAN CYST SURGERY     left   RADIOLOGY WITH ANESTHESIA N/A 11/10/2019   Procedure: VERTEBROPLASTY;  Surgeon: Luanne Bras, MD;  Location: [place];  Service: Radiology;  Laterality: N/A;   TONSILLECTOMY     TOTAL KNEE ARTHROPLASTY  06/2010   left   TUBAL LIGATION      Allergies Penicillins, Valsartan, Atorvastatin, Carvedilol, Ezetimibe, Fluvastatin sodium, Magnesium hydroxide,  Meloxicam, Pneumovax [pneumococcal polysaccharide vaccine], Quinapril hcl, Simvastatin, Topamax [topiramate], and Vit d-vit e-safflower oil  Family History  Problem Relation Age of Onset   Breast cancer Mother    Heart disease Father        MIs   Colon cancer Other        cousin   Breast cancer Sister    Leukemia Brother        GM    Social History Social History   Tobacco Use   Smoking status: Former    Packs/day: 1.00    Years: 37.00    Pack years: 37.00    Types: Cigarettes    Start date: 06/26/1949    Quit date: 05/14/1988    Years since quitting: 32.6   Smokeless tobacco: Never   Tobacco comments:    quit 25 years ago  Vaping Use   Vaping  Use: Never used  Substance Use Topics   Alcohol use: Not Currently    Alcohol/week: 1.0 standard drink    Types: 1 Glasses of wine per week    Comment: occasional/social   Drug use: No    Review of Systems  Constitutional: No fever/chills Eyes: No visual changes. ENT: No sore throat. Cardiovascular: Positive chest pain. Respiratory: Denies shortness of breath. Gastrointestinal: Positive right flank/abdominal pain.  No nausea, no vomiting.  No diarrhea.  No constipation. Genitourinary: Negative for dysuria. Musculoskeletal: Negative for back pain. Skin: Negative for rash. Neurological: Negative for headaches, focal weakness or numbness.  10-point ROS otherwise negative.  ____________________________________________   PHYSICAL EXAM:  VITAL SIGNS: ED Triage Vitals  Enc Vitals Group     BP 12/25/20 0504 125/64     Pulse Rate 12/25/20 0504 75     Resp 12/25/20 0504 16     Temp 12/25/20 0504 98.7 F (37.1 C)     Temp src --      SpO2 12/25/20 0449 95 %    Constitutional: Alert and oriented. Well appearing and in no acute distress. Patient appears intermittently uncomfortable with twinges of pain on the right, grabbing at her side.  Eyes: Conjunctivae are normal.  Head: Atraumatic. Nose: No  congestion/rhinnorhea. Mouth/Throat: Mucous membranes are moist.   Neck: No stridor.  Cardiovascular: Normal rate, regular rhythm. Good peripheral circulation. Grossly normal heart sounds.   Respiratory: Normal respiratory effort.  No retractions. Lungs CTAB. Gastrointestinal: Soft and non-tender in the RUQ or RLQ specifically. No distention.  Musculoskeletal: No lower extremity tenderness nor edema. No gross deformities of extremities. Some tenderness to the right flank and chest wall. No vesicular rash.  Neurologic:  Normal speech and language. No gross focal neurologic deficits are appreciated.  Skin:  Skin is warm, dry and intact. No rash noted.  ____________________________________________   LABS (all labs ordered are listed, but only abnormal results are displayed)  Labs Reviewed  COMPREHENSIVE METABOLIC PANEL - Abnormal; Notable for the following components:      Result Value   Sodium 133 (*)    Glucose, Bld 129 (*)    Total Protein 6.1 (*)    Total Bilirubin 2.4 (*)    All other components within normal limits  CBC WITH DIFFERENTIAL/PLATELET - Abnormal; Notable for the following components:   RBC 3.28 (*)    MCV 115.2 (*)    MCH 37.8 (*)    Lymphs Abs 0.3 (*)    All other components within normal limits  D-DIMER, QUANTITATIVE - Abnormal; Notable for the following components:   D-Dimer, Quant 0.78 (*)    All other components within normal limits  LIPASE, BLOOD  TROPONIN I (HIGH SENSITIVITY)  TROPONIN I (HIGH SENSITIVITY)  TROPONIN I (HIGH SENSITIVITY)   ____________________________________________  EKG   EKG Interpretation  Date/Time:  Sunday December 25 2020 05:40:48 EDT Ventricular Rate:  74 PR Interval:  190 QRS Duration: 64 QT Interval:  354 QTC Calculation: 392 R Axis:   69 Text Interpretation: Normal sinus rhythm Septal infarct , age undetermined Abnormal ECG Similar to prior tracing Confirmed by Nanda Quinton [phone]) on 12/25/2020 12:01:54 PM         ____________________________________________  RADIOLOGY  DG Chest 2 View  Result Date: 12/25/2020 CLINICAL DATA:  87 year old female with chest pain, right rib and shoulder pain with no known injury. EXAM: CHEST - 2 VIEW COMPARISON:  Chest radiographs 11/16/2020 and earlier. FINDINGS: Upright PA and lateral views. Numerous thoracic and upper  lumbar compression fractures, at least 6 levels previously augmented. No new osseous abnormality identified. Mildly lower lung volumes, chronic hyperinflation. Chronically tortuous thoracic aorta and mild cardiomegaly appears stable. Visualized tracheal air column is within normal limits. No pneumothorax, pulmonary edema, pleural effusion or confluent pulmonary opacity. Paucity of bowel gas in the upper abdomen. Chronic surgical clips left breast and/or chest wall. IMPRESSION: 1. No acute cardiopulmonary abnormality. Chronic cardiomegaly and tortuous thoracic aorta. 2. Numerous chronic spinal compression fractures, 6 levels previously augmented. No new osseous abnormality identified. Electronically Signed   By: Genevie Ann M.D.   On: 12/25/2020 06:21   CT Angio Chest PE W and/or Wo Contrast  Addendum Date: 12/25/2020   ADDENDUM REPORT: 12/25/2020 15:03 ADDENDUM: These results were called by telephone at the time of interpretation on 12/25/2020 at 3:02 pm to provider Averiana Clouatre , who verbally acknowledged these results. Electronically Signed   By: Fidela Salisbury M.D.   On: 12/25/2020 15:03   Result Date: 12/25/2020 CLINICAL DATA:  PE suspected, low/intermediate prob, positive D-dimer. Right chest pain EXAM: CT ANGIOGRAPHY CHEST WITH CONTRAST TECHNIQUE: Multidetector CT imaging of the chest was performed using the standard protocol during bolus administration of intravenous contrast. Multiplanar CT image reconstructions and MIPs were obtained to evaluate the vascular anatomy. CONTRAST:  53m OMNIPAQUE IOHEXOL 350 MG/ML SOLN COMPARISON:  None. FINDINGS:  Cardiovascular: There is adequate opacification of the pulmonary arterial tree. There is an intraluminal filling defect identified within the posterior basal segmental pulmonary artery of the right lower lobe in keeping with an acute pulmonary embolism. This is best seen on axial image # 240/5 and coronal image # 91/6. The overall embolic burden is small. There is no CT evidence of right heart strain. The central pulmonary arteries are of normal caliber. Extensive multi-vessel coronary artery calcification. Moderate calcification of the aortic valve leaflets. Global cardiac size is within normal limits. No pericardial effusion. Mild atherosclerotic calcification is seen within the thoracic aorta. No aortic aneurysm. Mediastinum/Nodes: The visualized thyroid is unremarkable. No pathologic thoracic adenopathy. Large hiatal hernia. There is debris within the distal esophagus which may relate to gastroesophageal reflux or esophageal dysmotility. Lungs/Pleura: Mild bibasilar atelectasis is present, right greater than left. Trace right pleural effusion is present. Lungs are otherwise clear. No pneumothorax. No central obstructing lesion. Upper Abdomen: No acute abnormality. Musculoskeletal: No acute bone abnormality. The osseous structures are diffusely osteopenic. Multilevel vertebroplasty has been performed throughout the thoracic spine. No acute bone abnormality. Review of the MIP images confirms the above findings. IMPRESSION: Acute pulmonary embolism involving the posterior basal segment of the right lower lobe. Overall embolic burden is small. No CT evidence of right heart strain. Small right pleural effusion may be reactive in nature. Extensive multi-vessel coronary artery calcification. Moderate calcification of the aortic valve leaflets. Echocardiography may be helpful to assess the degree of valvular dysfunction. Large hiatal hernia. Small debris within the distal esophagus may relate to esophageal dysmotility  or gastroesophageal reflux. Osteopenia with multilevel thoracic vertebroplasty noted. Aortic Atherosclerosis (ICD10-I70.0). Electronically Signed: By: AFidela SalisburyM.D. On: 12/25/2020 14:35   CT Renal Stone Study  Result Date: 12/25/2020 CLINICAL DATA:  Right flank pain EXAM: CT ABDOMEN AND PELVIS WITHOUT CONTRAST TECHNIQUE: Multidetector CT imaging of the abdomen and pelvis was performed following the standard protocol without IV contrast. COMPARISON:  10/03/2020 FINDINGS: Lower chest: Extensive multi-vessel coronary artery calcification. Large hiatal hernia. Trace right pleural effusion. Hepatobiliary: No focal liver abnormality is seen. Status post cholecystectomy. No biliary dilatation.  Pancreas: Unremarkable Spleen: Scattered benign calcified granuloma are seen throughout the spleen, unchanged. The spleen is otherwise unremarkable. Adrenals/Urinary Tract: Adrenal glands are unremarkable. Kidneys are normal, without renal calculi, focal lesion, or hydronephrosis. Bladder is unremarkable. Stomach/Bowel: The appendix is not clearly identified, however, there are no secondary signs of appendicitis within the right lower quadrant of the abdomen. The stomach, large bowel, and small bowel are otherwise unremarkable. No free intraperitoneal gas or fluid. Vascular/Lymphatic: Extensive aortoiliac atherosclerotic calcification. Tortuosity of the abdominal aorta is again noted. No aortic aneurysm. No pathologic adenopathy within the abdomen and pelvis. Reproductive: Uterus and bilateral adnexa are unremarkable. Other: No abdominal wall hernia.  The rectum is unremarkable. Musculoskeletal: The osseous structures are diffusely osteopenic. L2 vertebroplasty again noted. Interval L1 vertebroplasty has been performed. Stable inferior endplate fracture of L3 and superior endplate fracture of L4. Left hip ORIF has been performed. Remote fractures of the a inferior pubic rami are noted bilaterally. No acute bone abnormality.  IMPRESSION: No acute intra-abdominal pathology identified. Extensive multi-vessel coronary artery calcification. Trace right pleural effusion. Large hiatal hernia. Diffuse osteopenia with multilevel vertebroplasty within the lumbar spine. Stable endplate fractures of L3 and L4. No acute bone abnormality. Aortic Atherosclerosis (ICD10-I70.0). Electronically Signed   By: Fidela Salisbury M.D.   On: 12/25/2020 14:45    ____________________________________________   PROCEDURES  Procedure(s) performed:   Procedures  CRITICAL CARE Performed by: Margette Fast Total critical care time: 35 minutes Critical care time was exclusive of separately billable procedures and treating other patients. Critical care was necessary to treat or prevent imminent or life-threatening deterioration. Critical care was time spent personally by me on the following activities: development of treatment plan with patient and/or surrogate as well as nursing, discussions with consultants, evaluation of patient's response to treatment, examination of patient, obtaining history from patient or surrogate, ordering and performing treatments and interventions, ordering and review of laboratory studies, ordering and review of radiographic studies, pulse oximetry and re-evaluation of patient's condition.  Nanda Quinton, MD Emergency Medicine  ____________________________________________   INITIAL IMPRESSION / ASSESSMENT AND PLAN / ED COURSE  Pertinent labs & imaging results that were available during my care of the patient were reviewed by me and considered in my medical decision making (see chart for details).   Patient presents to the emergency department with right flank and chest wall type pain.  Symptoms possibly musculoskeletal and somewhat reproducible on exam although with patient's age I do of some concern for potentially serious underlying etiology such as PE, ureteral stone.  Feel that biliary colic is much less likely.   Patient waited in the waiting room for significant amount of time and came back with chest x-ray along with single troponin and labs resulted.  No significant abnormality there.  Plan for D-dimer and troponin.  She has remote history of breast cancer although not undergoing active treatment.  She also reports unintentional weight loss recently. Requesting her home dose Ativan which was ordered. Will likely require some advanced imaging. Will wait on d-dimer.   01:20 PM  Patient's d-dimer is elevated to 0.78. While within age adjusted range with sudden, sharp atypical pain will move forward with CTA of the chest only. On re-evaluation, patient's abdomen remains non-tender and symptoms are focused subjectively in the chest. Will get CT renal to evaluate for ureteral stone but exam much less consistent with intra-abdominal process.   02:45 PM  Patient still fairly uncomfortable after meds.  CTA shows small PE on the side  and location of the patient's discomfort.  Despite the relatively small size the patient is high risk by PESI score (129) and continues to have pain.   Discussed patient's case with [facility] to request admission. Patient and family (if present) updated with plan. Care transferred to [facility] service.  I reviewed all nursing notes, vitals, pertinent old records, EKGs, labs, imaging (as available).  ____________________________________________  FINAL CLINICAL IMPRESSION(S) / ED DIAGNOSES  Final diagnoses:  Other acute pulmonary embolism without acute cor pulmonale (HCC)  Atypical chest pain    MEDICATIONS GIVEN DURING THIS VISIT:  Medications  apixaban (ELIQUIS) tablet 10 mg (has no administration in time range)    Followed by  apixaban (ELIQUIS) tablet 5 mg (has no administration in time range)  LORazepam (ATIVAN) tablet 0.5 mg (0.5 mg Oral Given 12/25/20 1252)  acetaminophen (TYLENOL) tablet 1,000 mg (1,000 mg Oral Given 12/25/20 1253)  iohexol (OMNIPAQUE) 350 MG/ML injection 50 mL (50  mLs Intravenous Contrast Given 12/25/20 1412)    Note:  This document was prepared using Dragon voice recognition software and may include unintentional dictation errors.  Nanda Quinton, MD, [facility] Emergency Medicine    Yarelie Hams, [facility], MD 12/25/20 [phone]

## 2020-12-25 NOTE — H&P (Signed)
History and Physical    [name] [id number] DOB: [birth year] DOA: 12/25/2020  PCP: Dawn Branch, MD Consultants:  [name] - neurosurgery; [name] - oncology; [name] - cardiology Patient coming from:  Home - lives alone, has caregivers 2 days a week; [id number], [name], [phone]  Chief Complaint: R shoulder pain  HPI: [name] is a 86 y.o. female with medical history significant of CAD; chronic diastolic CHF; CVA; polycythemia; depression/anxiety;  HTN; HLD; and OSA not on CPAP presenting with R shoulder pain.  She was taken off ASA 2-3 weeks ago.  She woke up about 0300 to go to the bathroom and had unbearable pain in her R lower chest.  She took hydrocodone and it began to ease off a bit.  She called 911, "you learn to take care of yourself."  No SOB.  No cough.  No LE edema.  She has been been feeling very tired.  She hasn't been getting around too well.  She is not having recent falls.  She does still drive a bit.    ED Course: Tiny PE causing a lot of pain.  Acute R chest/shoulder pain.  D-dimer elevated, CTA + for PE in that location.  Started on Eliquis, will monitor overnight.  Review of Systems: As per HPI; otherwise review of systems reviewed and negative.   Ambulatory Status:  Ambulates with a cane  COVID Vaccine Status:   Complete plus booster  Past Medical History:  Diagnosis Date   CAD (coronary artery disease)    mild nonobstructive disease on cath in 2003   [facility])    Cardiomyopathy    Probable Takotsubo, severe CP w/ normal cath in 1994. Severe CP in 2003 w/ widespread T wave inversions on ECG. Cath w/ minimal coronary disease but LV-gram showed periapical severe hypokinesis and basilar hyperkinesia (EF 40%). Last echo in 4/09 confirmed full LV functional recovery with EF 60%, no regional wall motion abnormalities, mild to moderate LVH.   Chronic diastolic CHF (congestive heart failure) ([place]) 09/13/2019   CVA (cerebral infarction)     Depression with anxiety 03/29/2011   lost husband 3'09   DVT (deep venous thrombosis) ([place])    after venous ablation, R leg   Fracture 09/10/2007   L2, status post vertebroplasty of L2 performed by IR   GERD (gastroesophageal reflux disease)    Hemorrhoids    Hiatal hernia 03/29/2011   no nerve problems   Hyperlipidemia    Hyperplastic Dawn polyp 06/2001   Hypertension    Iron deficiency anemia due to chronic blood loss 12/30/2017   OA (osteoarthritis) of knee 03/29/2011   w/ bilateral knee pain-not a problem now   OSA (obstructive sleep apnea) 03/29/2011   no cpap used, not a problem now.   Osteoporosis    Otalgia of both ears    Dr. Simeon Craft   Polycythemia    Stroke [facility]) 03/29/2011   CVA x2 -last 10'12-?TIA(visual problems)   Varicose vein     Past Surgical History:  Procedure Laterality Date   APPENDECTOMY     BACK SURGERY     BREAST BIOPSY     BREAST CYST EXCISION     BREAST LUMPECTOMY Left 2013   left stage I left breast cancer   CHOLECYSTECTOMY  04/09/2011   Procedure: LAPAROSCOPIC CHOLECYSTECTOMY WITH INTRAOPERATIVE CHOLANGIOGRAM;  Surgeon: Odis Hollingshead, MD;  Location: [facility];  Service: General;  Laterality: N/A;   Dental Extraction     L maxillary molar  INTRAMEDULLARY (IM) NAIL INTERTROCHANTERIC Left 12/25/2019   Procedure: INTRAMEDULLARY (IM) NAIL INTERTROCHANTRIC;  Surgeon: Paralee Cancel, MD;  Location: [facility];  Service: Orthopedics;  Laterality: Left;   IR KYPHO THORACIC WITH BONE BIOPSY  09/15/2019   IR KYPHO THORACIC WITH BONE BIOPSY  07/13/2020       IR KYPHO THORACIC WITH BONE BIOPSY  07/13/2020   IR VERTEBROPLASTY CERV/THOR BX Parrish UNI/BIL Parrish/INJECT/IMAGING  11/10/2019   KYPHOPLASTY N/A 11/16/2020   Procedure: T11,  L1,KYPHOPLASTY;  Surgeon: Melina Schools, MD;  Location: [place];  Service: Orthopedics;  Laterality: N/A;   KYPHOSIS SURGERY  08/2007   cement used   OVARIAN CYST SURGERY     left   RADIOLOGY WITH ANESTHESIA N/A 11/10/2019   Procedure:  VERTEBROPLASTY;  Surgeon: Luanne Bras, MD;  Location: [place];  Service: Radiology;  Laterality: N/A;   TONSILLECTOMY     TOTAL KNEE ARTHROPLASTY  06/2010   left   TUBAL LIGATION      Social History   Socioeconomic History   Marital status: Widowed    Spouse name: Not on file   Number of children: 3   Years of education: Not on file   Highest education level: Not on file  Occupational History   Occupation: n/a  Tobacco Use   Smoking status: Former    Packs/day: 1.00    Years: 37.00    Pack years: 37.00    Types: Cigarettes    Start date: 06/26/1949    Quit date: 05/14/1988    Years since quitting: 32.6   Smokeless tobacco: Never   Tobacco comments:    quit 25 years ago  Vaping Use   Vaping Use: Never used  Substance and Sexual Activity   Alcohol use: Not Currently    Alcohol/week: 1.0 standard drink    Types: 1 Glasses of wine per week    Comment: occasional/social   Drug use: No   Sexual activity: Not Currently  Other Topics Concern   Not on file  Social History Narrative   Lost husband 07/29/07-    Lives in [place]   Lives in a town house by herself, has 3 daughters, lost Mickel Baas (03-2017)   [place] ( [place]     Not driving as of [id number]            Social Determinants of Health   Financial Resource Strain: Not on file  Food Insecurity: Not on file  Transportation Needs: Not on file  Physical Activity: Not on file  Stress: Not on file  Social Connections: Not on file  Intimate Partner Violence: Not on file    Allergies  Allergen Reactions   Penicillins Hives, Rash and Other (See Comments)    Has patient had a PCN reaction causing immediate rash, facial/tongue/throat swelling, SOB or lightheadedness with hypotension: Yes Has patient had a PCN reaction causing severe rash involving mucus membranes or skin necrosis: yes Has patient had a PCN reaction that required hospitalization: No Has patient had a PCN reaction occurring within  the last 10 years: no If all of the above answers are "NO", then may proceed with Cephalosporin use.  Other Reaction: OTHER REACTION   Valsartan Itching and Other (See Comments)   Atorvastatin Diarrhea   Carvedilol Other (See Comments)    "teeth hurt when I took it"    Ezetimibe Nausea Only   Fluvastatin Sodium Other (See Comments)    "Can't remember"   Magnesium Hydroxide Other (See Comments)  REACTION: triggers HAs   Meloxicam Other (See Comments)    Pt seeing auro's / spots.     Pneumovax [Pneumococcal Polysaccharide Vaccine] Other (See Comments)    Local reaction   Quinapril Hcl Other (See Comments)     "feelings of tiredness"   Simvastatin Diarrhea   Topamax [Topiramate] Itching   Vit D-Vit E-Safflower Oil Other (See Comments)    Headaches    Family History  Problem Relation Age of Onset   Breast cancer Mother    Heart disease Father        MIs   Dawn cancer Other        cousin   Breast cancer Sister    Leukemia Brother        GM    Prior to Admission medications   Medication Sig Start Date End Date Taking? Authorizing Provider  acetaminophen (TYLENOL) 500 MG tablet Take 500 mg by mouth every 6 (six) hours as needed.   Yes [provider]  alendronate (FOSAMAX) 70 MG tablet Take 1 tablet (70 mg total) by mouth every 7 (seven) days. Take with a full glass of water on an empty stomach. 06/08/20  Yes Paz, Alda Berthold, MD  HYDROcodone-acetaminophen (NORCO/VICODIN) 5-325 MG tablet Take 1 tablet by mouth 2 (two) times daily. 11/30/20  Yes Paz, Alda Berthold, MD  lactose free nutrition (BOOST) LIQD Take 237 mLs by mouth daily.   Yes [provider]  LORazepam (ATIVAN) 0.5 MG tablet 1/2 tab in AM, 1/2 tab PM, 1/2 to 1 tab hs prn Patient taking differently: Take 0.5 mg by mouth in the morning, at noon, and at bedtime. 10/24/20  Yes Paz, Alda Berthold, MD  metoprolol tartrate (LOPRESSOR) 50 MG tablet Take 1 tablet (50 mg total) by mouth 2 (two) times daily. 10/07/20  Yes Paz,  Alda Berthold, MD  nitroGLYCERIN (NITROSTAT) 0.4 MG SL tablet Place 1 tablet (0.4 mg total) under the tongue every 5 (five) minutes x 3 doses as needed for chest pain. 07/25/20  Yes Paz, Alda Berthold, MD  omeprazole (PRILOSEC) 20 MG capsule Take 1 capsule (20 mg total) by mouth daily. Patient taking differently: Take 20 mg by mouth 2 (two) times daily before a meal. 10/05/19  Yes Lassen, Arlo C, PA-C  bismuth subsalicylate (PEPTO BISMOL) 262 MG/15ML suspension Take 30 mLs by mouth every 4 (four) hours as needed for indigestion (heartburn).  Patient not taking: No sig reported    [provider]  calcium carbonate (TUMS - DOSED IN MG ELEMENTAL CALCIUM) 500 MG chewable tablet Chew 1-2 tablets by mouth daily as needed for indigestion or heartburn. Patient not taking: No sig reported    [provider]  hydroxyurea (HYDREA) 500 MG capsule TAKE 1 CAPSULE BY MOUTH EVERY DAY. MAY TAKE WITH FOOD TO MINIMIZE GI SIDE EFFECTS Patient taking differently: Take 500 mg by mouth daily. 12/21/20   Volanda Napoleon, MD  megestrol (MEGACE) 400 MG/10ML suspension Take 10 mLs (400 mg total) by mouth 2 (two) times daily. Patient not taking: Reported on 12/25/2020 12/01/20   Volanda Napoleon, MD  senna-docusate (SENOKOT-S) 8.6-50 MG tablet Take 1 tablet by mouth 2 (two) times daily. Patient not taking: No sig reported 10/06/20   Florencia Reasons, MD    Physical Exam: Vitals:   12/25/20 1300 12/25/20 1315 12/25/20 1330 12/25/20 1345  BP: (!) 132/117 (!) 171/75 138/76 131/63  Pulse: 86 81 80 76  Resp: (!) 28 (!) 21 (!) 21 (!) 22  Temp:  SpO2: 92% 100% 98% 97%     General:  Appears calm and comfortable and is in NAD Eyes:  PERRL, EOMI, normal lids, iris ENT:  hard of hearing, grossly normal lips & tongue, mmm Neck:  no LAD, masses or thyromegaly Cardiovascular:  RRR, no m/r/g. No LE edema.  Respiratory:   CTA bilaterally with no wheezes/rales/rhonchi.  Mildly increased respiratory effort. Abdomen:  soft, NT,  ND Skin:  no rash or induration seen on limited exam; ecchymosis on R anterior lower leg Musculoskeletal:  grossly normal tone BUE/BLE, good ROM, no bony abnormality Psychiatric:  grossly normal mood and affect, speech fluent and appropriate, AOx3 Neurologic:  CN 2-12 grossly intact, moves all extremities in coordinated fashion    Radiological Exams on Admission: Independently reviewed - see discussion in A/P where applicable  DG Chest 2 View  Result Date: 12/25/2020 CLINICAL DATA:  87 year old female with chest pain, right rib and shoulder pain with no known injury. EXAM: CHEST - 2 VIEW COMPARISON:  Chest radiographs 11/16/2020 and earlier. FINDINGS: Upright PA and lateral views. Numerous thoracic and upper lumbar compression fractures, at least 6 levels previously augmented. No new osseous abnormality identified. Mildly lower lung volumes, chronic hyperinflation. Chronically tortuous thoracic aorta and mild cardiomegaly appears stable. Visualized tracheal air column is within normal limits. No pneumothorax, pulmonary edema, pleural effusion or confluent pulmonary opacity. Paucity of bowel gas in the upper abdomen. Chronic surgical clips left breast and/or chest wall. IMPRESSION: 1. No acute cardiopulmonary abnormality. Chronic cardiomegaly and tortuous thoracic aorta. 2. Numerous chronic spinal compression fractures, 6 levels previously augmented. No new osseous abnormality identified. Electronically Signed   By: Genevie Ann M.D.   On: 12/25/2020 06:21   CT Angio Chest PE W and/or Wo Contrast  Addendum Date: 12/25/2020   ADDENDUM REPORT: 12/25/2020 15:03 ADDENDUM: These results were called by telephone at the time of interpretation on 12/25/2020 at 3:02 pm to provider JOSHUA LONG , who verbally acknowledged these results. Electronically Signed   By: Fidela Salisbury M.D.   On: 12/25/2020 15:03   Result Date: 12/25/2020 CLINICAL DATA:  PE suspected, low/intermediate prob, positive D-dimer. Right chest  pain EXAM: CT ANGIOGRAPHY CHEST WITH CONTRAST TECHNIQUE: Multidetector CT imaging of the chest was performed using the standard protocol during bolus administration of intravenous contrast. Multiplanar CT image reconstructions and MIPs were obtained to evaluate the vascular anatomy. CONTRAST:  7m OMNIPAQUE IOHEXOL 350 MG/ML SOLN COMPARISON:  None. FINDINGS: Cardiovascular: There is adequate opacification of the pulmonary arterial tree. There is an intraluminal filling defect identified within the posterior basal segmental pulmonary artery of the right lower lobe in keeping with an acute pulmonary embolism. This is best seen on axial image # 240/5 and coronal image # 91/6. The overall embolic burden is small. There is no CT evidence of right heart strain. The central pulmonary arteries are of normal caliber. Extensive multi-vessel coronary artery calcification. Moderate calcification of the aortic valve leaflets. Global cardiac size is within normal limits. No pericardial effusion. Mild atherosclerotic calcification is seen within the thoracic aorta. No aortic aneurysm. Mediastinum/Nodes: The visualized thyroid is unremarkable. No pathologic thoracic adenopathy. Large hiatal hernia. There is debris within the distal esophagus which may relate to gastroesophageal reflux or esophageal dysmotility. Lungs/Pleura: Mild bibasilar atelectasis is present, right greater than left. Trace right pleural effusion is present. Lungs are otherwise clear. No pneumothorax. No central obstructing lesion. Upper Abdomen: No acute abnormality. Musculoskeletal: No acute bone abnormality. The osseous structures are diffusely osteopenic. Multilevel vertebroplasty  has been performed throughout the thoracic spine. No acute bone abnormality. Review of the MIP images confirms the above findings. IMPRESSION: Acute pulmonary embolism involving the posterior basal segment of the right lower lobe. Overall embolic burden is small. No CT evidence of  right heart strain. Small right pleural effusion may be reactive in nature. Extensive multi-vessel coronary artery calcification. Moderate calcification of the aortic valve leaflets. Echocardiography may be helpful to assess the degree of valvular dysfunction. Large hiatal hernia. Small debris within the distal esophagus may relate to esophageal dysmotility or gastroesophageal reflux. Osteopenia with multilevel thoracic vertebroplasty noted. Aortic Atherosclerosis (ICD10-I70.0). Electronically Signed: By: Fidela Salisbury M.D. On: 12/25/2020 14:35   CT Renal Stone Study  Result Date: 12/25/2020 CLINICAL DATA:  Right flank pain EXAM: CT ABDOMEN AND PELVIS WITHOUT CONTRAST TECHNIQUE: Multidetector CT imaging of the abdomen and pelvis was performed following the standard protocol without IV contrast. COMPARISON:  10/03/2020 FINDINGS: Lower chest: Extensive multi-vessel coronary artery calcification. Large hiatal hernia. Trace right pleural effusion. Hepatobiliary: No focal liver abnormality is seen. Status post cholecystectomy. No biliary dilatation. Pancreas: Unremarkable Spleen: Scattered benign calcified granuloma are seen throughout the spleen, unchanged. The spleen is otherwise unremarkable. Adrenals/Urinary Tract: Adrenal glands are unremarkable. Kidneys are normal, without renal calculi, focal lesion, or hydronephrosis. Bladder is unremarkable. Stomach/Bowel: The appendix is not clearly identified, however, there are no secondary signs of appendicitis within the right lower quadrant of the abdomen. The stomach, large bowel, and small bowel are otherwise unremarkable. No free intraperitoneal gas or fluid. Vascular/Lymphatic: Extensive aortoiliac atherosclerotic calcification. Tortuosity of the abdominal aorta is again noted. No aortic aneurysm. No pathologic adenopathy within the abdomen and pelvis. Reproductive: Uterus and bilateral adnexa are unremarkable. Other: No abdominal wall hernia.  The rectum is  unremarkable. Musculoskeletal: The osseous structures are diffusely osteopenic. L2 vertebroplasty again noted. Interval L1 vertebroplasty has been performed. Stable inferior endplate fracture of L3 and superior endplate fracture of L4. Left hip ORIF has been performed. Remote fractures of the a inferior pubic rami are noted bilaterally. No acute bone abnormality. IMPRESSION: No acute intra-abdominal pathology identified. Extensive multi-vessel coronary artery calcification. Trace right pleural effusion. Large hiatal hernia. Diffuse osteopenia with multilevel vertebroplasty within the lumbar spine. Stable endplate fractures of L3 and L4. No acute bone abnormality. Aortic Atherosclerosis (ICD10-I70.0). Electronically Signed   By: Fidela Salisbury M.D.   On: 12/25/2020 14:45    EKG: Independently reviewed.  NSR with rate 74; nonspecific ST changes with NSCSLT   Labs on Admission: I have personally reviewed the available labs and imaging studies at the time of the admission.  Pertinent labs:   Na++ 133 Glucose 129 Bili 2.4 HS troponin 6, 7 Unremarkable CBC D-dimer 0.78   Assessment/Plan Principal Problem:   PE (pulmonary thromboembolism) (HCC) Active Problems:   Hyperlipidemia   ANXIETY DEPRESSION   Essential hypertension   Polycythemia   Mild CAD   Hypertension   Acute PE -Patient without prior episode of thromboembolic disease (possibly remotely post-procedure but patient denies and there is no clear evidence of this on imaging reports in the past) presenting with new PE -She is hemodynamically stable and not requiring O2 supplementation -Will observe on telemetry -No R heart strain on CT with small PE, will not order Echo at this time -Initiate anticoagulation - for now, will start Eliquis -Will request TOC team consultation to assist with medication cost -This likely occurred due to decreasing mobility, but she does have polycythemia and was recently stopped (or stopped  due to  confusion about medication instructions, per her report) on ASA and this may have predisposed her -The patient understands that thromboembolic disease can be catastrophic and even deadly and that she must be complaint with physician appointments and anticoagulation. -Will defer length of AC to Dr. Marin Olp; will send him a Secure Chat to notify him of admission and request that he establish outpatient f/u.  H/o ICH -Mechanical fall in 10/2019 -Needs to be closely observed while on [facility] but risks < benefits at this time regarding starting [facility] -She reports no recent falls, has been counseled about risks associated with falls on [facility]  CAD -Severe multivessel disease and valvular disease appreciated on CTA -Needs outpatient PCP/cardiology f/u -Negative troponin x 2, no current concern for ACS  Chronic diastolic CHF -Grade 1 on echo in 2020 -Appears to be compensated at this time  Polycythemia -Recent visit with Dr. Marin Olp on 7/21 -Prior h/o stage 1 breast cancer, now with JAK2 positive polycythemia vera -She has periodic phlebotomy to maintain HCT <45% -Started on hydroxyurea in 2021, will continue  Compression fractures -Treated recently with kyphoplasty -Continue prn Norco  HTN -Continue Lopressor  HLD -Shee does not appear to be taking medications for this issue at this time   Depression/anxiety -Continue prn Ativan  OSA -Not on CPAP    Note: This patient has been tested and is negative for the novel coronavirus COVID-19. The patient has been fully vaccinated against COVID-19.   Level of care: Telemetry Medical DVT prophylaxis: Eliquis Code Status:  DNR - confirmed with patient Family Communication: None present Disposition Plan:  The patient is from: home  Anticipated d/c is to: home without [facility] services  Anticipated d/c date will depend on clinical response to treatment, but possibly as early as tomorrow if she has excellent response to treatment  Patient is currently:  acutely ill Consults called: TOC team; Nutrition  Admission status:  It is my clinical opinion that referral for OBSERVATION is reasonable and necessary in this patient based on the above information provided. The aforementioned taken together are felt to place the patient at high risk for further clinical deterioration. However it is anticipated that the patient may be medically stable for discharge from the hospital within 24 to 48 hours.    Karmen Bongo MD [facility]   How to contact the [facility] Attending or Consulting provider [place] or covering provider during after hours [place], for this patient?  Check the care team in [facility] and look for a) attending/consulting TRH provider listed and b) the [facility] team listed Log into [email or web address] and use [place] universal password to access. If you do not have the password, please contact the hospital operator. Locate the [facility] provider you are looking for under [facility] and page to a number that you can be directly reached. If you still have difficulty reaching the provider, please page the [facility] (Director on Call) for the Hospitalists listed on amion for assistance.   12/25/2020, 5:19 PM

## 2020-12-26 ENCOUNTER — Other Ambulatory Visit (HOSPITAL_COMMUNITY): Payer: Self-pay

## 2020-12-26 ENCOUNTER — Encounter: Payer: Self-pay | Admitting: Hematology & Oncology

## 2020-12-26 DIAGNOSIS — I2699 Other pulmonary embolism without acute cor pulmonale: Secondary | ICD-10-CM | POA: Diagnosis not present

## 2020-12-26 LAB — BASIC METABOLIC PANEL
Anion gap: 4 — ABNORMAL LOW (ref 5–15)
BUN: 7 mg/dL — ABNORMAL LOW (ref 8–23)
CO2: 29 mmol/L (ref 22–32)
Calcium: 8.6 mg/dL — ABNORMAL LOW (ref 8.9–10.3)
Chloride: 103 mmol/L (ref 98–111)
Creatinine, Ser: 0.49 mg/dL (ref 0.44–1.00)
GFR, Estimated: >60 mL/min (ref >60)
Glucose, Bld: 111 mg/dL — ABNORMAL HIGH (ref 70–99)
Potassium: 3.6 mmol/L (ref 3.5–5.1)
Sodium: 136 mmol/L (ref 135–145)

## 2020-12-26 LAB — CBC
HCT: 35 % — ABNORMAL LOW (ref 36.0–46.0)
Hemoglobin: 11.8 g/dL — ABNORMAL LOW (ref 12.0–15.0)
MCH: 38.6 pg — ABNORMAL HIGH (ref 26.0–34.0)
MCHC: 33.7 g/dL (ref 30.0–36.0)
MCV: 114.4 fL — ABNORMAL HIGH (ref 80.0–100.0)
Platelets: 154 10*3/uL (ref 150–400)
RBC: 3.06 MIL/uL — ABNORMAL LOW (ref 3.87–5.11)
RDW: 15.6 % — ABNORMAL HIGH (ref 11.5–15.5)
WBC: 3.6 10*3/uL — ABNORMAL LOW (ref 4.0–10.5)
nRBC: 0 % (ref 0.0–0.2)

## 2020-12-26 LAB — SARS CORONAVIRUS 2 (TAT 6-24 HRS): SARS Coronavirus 2: NEGATIVE

## 2020-12-26 MED ORDER — APIXABAN (ELIQUIS) VTE STARTER PACK (10MG AND 5MG)
ORAL_TABLET | ORAL | 0 refills | Status: DC
  Filled 2020-12-26: qty 74, 30d supply, fill #0

## 2020-12-26 MED ORDER — APIXABAN 5 MG PO TABS
5.0000 mg | ORAL_TABLET | Freq: Two times a day (BID) | ORAL | 2 refills | Status: DC
  Filled 2020-12-26: qty 60, 30d supply, fill #0

## 2020-12-26 NOTE — Discharge Instructions (Signed)
Information on my medicine - ELIQUIS (apixaban)  This medication education was reviewed with me or my healthcare representative as part of my discharge preparation.  The pharmacist that spoke with me during my hospital stay was:  Joetta Manners, [facility]  Why was Eliquis prescribed for you? Eliquis was prescribed to treat blood clots that may have been found in the veins of your legs (deep vein thrombosis) or in your lungs (pulmonary embolism) and to reduce the risk of them occurring again.  What do You need to know about Eliquis ? The starting dose is 10 mg (two 5 mg tablets) taken TWICE daily for the FIRST SEVEN (7) DAYS, then on (enter date)  8/21 at evening time,  the dose is reduced to ONE 5 mg tablet taken TWICE daily.  Eliquis may be taken with or without food.   Try to take the dose about the same time in the morning and in the evening. If you have difficulty swallowing the tablet whole please discuss with your pharmacist how to take the medication safely.  Take Eliquis exactly as prescribed and DO NOT stop taking Eliquis without talking to the doctor who prescribed the medication.  Stopping may increase your risk of developing a new blood clot.  Refill your prescription before you run out.  After discharge, you should have regular check-up appointments with your healthcare provider that is prescribing your Eliquis.    What do you do if you miss a dose? If a dose of ELIQUIS is not taken at the scheduled time, take it as soon as possible on the same day and twice-daily administration should be resumed. The dose should not be doubled to make up for a missed dose.  Important Safety Information A possible side effect of Eliquis is bleeding. You should call your healthcare provider right away if you experience any of the following: Bleeding from an injury or your nose that does not stop. Unusual colored urine (red or dark brown) or unusual colored stools (red or black). Unusual  bruising for unknown reasons. A serious fall or if you hit your head (even if there is no bleeding).  Some medicines may interact with Eliquis and might increase your risk of bleeding or clotting while on Eliquis. To help avoid this, consult your healthcare provider or pharmacist prior to using any new prescription or non-prescription medications, including herbals, vitamins, non-steroidal anti-inflammatory drugs (NSAIDs) and supplements.  This website has more information on Eliquis (apixaban): http://www.eliquis.com/eliquis/home

## 2020-12-26 NOTE — TOC Benefit Eligibility Note (Signed)
Transition of Care [facility]) Benefit Eligibility Note    Patient Details  Name: [name] MRN: [id number] Date of Birth: [birth year]   Medication/Dose: ELIQUIS  5 MG BID  ,  XARELTO 10 MG BID , LOVENOX 50 MG SYRINGES DAILY        Prescription Coverage Preferred Pharmacy: [facility]  and  [place] with Person/Company/Phone Number:: KELLI  @  [facility]   #  [phone]           Additional Notes: NO RX COVERAGE  PT'S HAS A DISCOUNT CARD    Memory Argue Phone Number: 12/26/2020, 3:07 PM

## 2020-12-26 NOTE — ED Notes (Signed)
Pt wheeled to waiting room by NT Tia. Pt verbalized understanding of discharge instructions.

## 2020-12-26 NOTE — Discharge Summary (Signed)
Physician Discharge Summary  [name] [id number] DOB: 05-09-1930 DOA: 12/25/2020  PCP: Colon Branch, MD  Admit date: 12/25/2020 Discharge date: 12/26/2020  Admitted From: Home  Disposition:  Home   Recommendations for Outpatient Follow-up:  Follow up with dr. Larose Kells PCP in 1 week Follow up with Oncology Dr. Marin Olp in 4-6 weeks       Home Health: None   Equipment/Devices: None new  Discharge Condition: Good  CODE STATUS: FULL Diet recommendation: Regular  Brief/Interim Summary: [name] is an 87 y.o. F with polycythemia vera, dCHF, hx CVA, CAD, subdural after fall in 2021, and OSA not on CPAP who presents with right shoulder pain and right lower chest pain, severe in character, new.  In the ER, CT angiogram showed segmental right lower lobe PE.  Started on Eliquis.  Medically stable, no oxygen requirement, PESI score low.     PRINCIPAL HOSPITAL DIAGNOSIS: Acute pulmonary embolism    Discharge Diagnoses:  Acute pulmonary embolism Patient was admitted, started on Eliquis.  Heart rate and respirations are normal, no oxygen requirement, symptoms controlled with oral analgesics, comfortable and ambulating without discomfort.  Discharged on new Eliquis, follow-up with her hematologist   History of subdural hematoma and subarachnoid hemorrhage 2021 Remote   Coronary disease, secondary prevention Chronic diastolic CHF  Polycythemia vera  Hypertension            Discharge Instructions  Discharge Instructions     Discharge instructions   Complete by: As directed    From Dr. Nelva Bush were admitted for a blood clot in the lungs (a pulmonary embolism or "PE") This was small  Take a blood thinner The blood thinner is Eliquis/apixaban For the first week, take apixaban 10 mg (two tabs) twice daily Starting in one week, reduce to the 5 mg twice daily dose (the starter pack should explain this)  If you have black and tarry bowel movements,  dark black diarrhea, or if you have red blood in your bowel movements,call Dr. Larose Kells   Go see Dr. Marin Olp in 1 month to discuss how long to stay on anticoagulation   Increase activity slowly   Complete by: As directed       Allergies as of 12/26/2020       Reactions   Penicillins Hives, Rash, Other (See Comments)   Has patient had a PCN reaction causing immediate rash, facial/tongue/throat swelling, SOB or lightheadedness with hypotension: Yes Has patient had a PCN reaction causing severe rash involving mucus membranes or skin necrosis: yes Has patient had a PCN reaction that required hospitalization: No Has patient had a PCN reaction occurring within the last 10 years: no If all of the above answers are "NO", then may proceed with Cephalosporin use. Other Reaction: OTHER REACTION   Valsartan Itching, Other (See Comments)   Atorvastatin Diarrhea   Carvedilol Other (See Comments)   "teeth hurt when I took it"   Ezetimibe Nausea Only   Fluvastatin Sodium Other (See Comments)   "Can't remember"   Magnesium Hydroxide Other (See Comments)   REACTION: triggers HAs   Meloxicam Other (See Comments)   Pt seeing auro's / spots.     Pneumovax [pneumococcal Polysaccharide Vaccine] Other (See Comments)   Local reaction   Quinapril Hcl Other (See Comments)    "feelings of tiredness"   Simvastatin Diarrhea   Topamax [topiramate] Itching   Vit D-vit E-safflower Oil Other (See Comments)   Headaches        Medication List  STOP taking these medications    bismuth subsalicylate [id number] suspension Commonly known as: PEPTO BISMOL   calcium carbonate 500 MG chewable tablet Commonly known as: TUMS - dosed in mg elemental calcium       TAKE these medications    acetaminophen 500 MG tablet Commonly known as: TYLENOL Take 500 mg by mouth every 6 (six) hours as needed.   alendronate 70 MG tablet Commonly known as: FOSAMAX Take 1 tablet (70 mg total) by mouth every 7 (seven)  days. Take with a full glass of water on an empty stomach.   Eliquis DVT/PE Starter Pack Generic drug: Apixaban Starter Pack ('10mg'$  and '5mg'$ ) Take as directed on package: start with two-'5mg'$  tablets twice daily for 7 days. On day 8, switch to one-'5mg'$  tablet twice daily.   apixaban 5 MG Tabs tablet Commonly known as: ELIQUIS Take 1 tablet (5 mg total) by mouth 2 (two) times daily.   HYDROcodone-acetaminophen 5-325 MG tablet Commonly known as: NORCO/VICODIN Take 1 tablet by mouth 2 (two) times daily.   hydroxyurea 500 MG capsule Commonly known as: HYDREA TAKE 1 CAPSULE BY MOUTH EVERY DAY. MAY TAKE WITH FOOD TO MINIMIZE GI SIDE EFFECTS What changed: See the new instructions.   lactose free nutrition Liqd Take 237 mLs by mouth daily.   LORazepam 0.5 MG tablet Commonly known as: ATIVAN 1/2 tab in AM, 1/2 tab PM, 1/2 to 1 tab hs prn What changed:  how much to take how to take this when to take this additional instructions   metoprolol tartrate 50 MG tablet Commonly known as: LOPRESSOR Take 1 tablet (50 mg total) by mouth 2 (two) times daily.   nitroGLYCERIN 0.4 MG SL tablet Commonly known as: NITROSTAT Place 1 tablet (0.4 mg total) under the tongue every 5 (five) minutes x 3 doses as needed for chest pain.   omeprazole 20 MG capsule Commonly known as: PRILOSEC Take 1 capsule (20 mg total) by mouth daily. What changed: when to take this        [place], MD. Schedule an appointment as soon as possible for a visit in 1 week(s).   Specialty: Internal Medicine Contact information: [address] [place] [address] [phone]         Volanda Napoleon, MD. Schedule an appointment as soon as possible for a visit in 1 month(s).   Specialty: Oncology Contact information: [address] [place] [address] [phone]                Allergies  Allergen Reactions   Penicillins Hives, Rash and  Other (See Comments)    Has patient had a PCN reaction causing immediate rash, facial/tongue/throat swelling, SOB or lightheadedness with hypotension: Yes Has patient had a PCN reaction causing severe rash involving mucus membranes or skin necrosis: yes Has patient had a PCN reaction that required hospitalization: No Has patient had a PCN reaction occurring within the last 10 years: no If all of the above answers are "NO", then may proceed with Cephalosporin use.  Other Reaction: OTHER REACTION   Valsartan Itching and Other (See Comments)   Atorvastatin Diarrhea   Carvedilol Other (See Comments)    "teeth hurt when I took it"    Ezetimibe Nausea Only   Fluvastatin Sodium Other (See Comments)    "Can't remember"   Magnesium Hydroxide Other (See Comments)    REACTION: triggers HAs  Meloxicam Other (See Comments)    Pt seeing auro's / spots.     Pneumovax [Pneumococcal Polysaccharide Vaccine] Other (See Comments)    Local reaction   Quinapril Hcl Other (See Comments)     "feelings of tiredness"   Simvastatin Diarrhea   Topamax [Topiramate] Itching   Vit D-Vit E-Safflower Oil Other (See Comments)    Headaches       Procedures/Studies: DG Chest 2 View  Result Date: 12/25/2020 CLINICAL DATA:  87 year old female with chest pain, right rib and shoulder pain with no known injury. EXAM: CHEST - 2 VIEW COMPARISON:  Chest radiographs 11/16/2020 and earlier. FINDINGS: Upright PA and lateral views. Numerous thoracic and upper lumbar compression fractures, at least 6 levels previously augmented. No new osseous abnormality identified. Mildly lower lung volumes, chronic hyperinflation. Chronically tortuous thoracic aorta and mild cardiomegaly appears stable. Visualized tracheal air column is within normal limits. No pneumothorax, pulmonary edema, pleural effusion or confluent pulmonary opacity. Paucity of bowel gas in the upper abdomen. Chronic surgical clips left breast and/or chest wall.  IMPRESSION: 1. No acute cardiopulmonary abnormality. Chronic cardiomegaly and tortuous thoracic aorta. 2. Numerous chronic spinal compression fractures, 6 levels previously augmented. No new osseous abnormality identified. Electronically Signed   By: Genevie Ann M.D.   On: 12/25/2020 06:21   CT Angio Chest PE W and/or Wo Contrast  Addendum Date: 12/25/2020   ADDENDUM REPORT: 12/25/2020 15:03 ADDENDUM: These results were called by telephone at the time of interpretation on 12/25/2020 at 3:02 pm to provider JOSHUA LONG , who verbally acknowledged these results. Electronically Signed   By: Fidela Salisbury M.D.   On: 12/25/2020 15:03   Result Date: 12/25/2020 CLINICAL DATA:  PE suspected, low/intermediate prob, positive D-dimer. Right chest pain EXAM: CT ANGIOGRAPHY CHEST WITH CONTRAST TECHNIQUE: Multidetector CT imaging of the chest was performed using the standard protocol during bolus administration of intravenous contrast. Multiplanar CT image reconstructions and MIPs were obtained to evaluate the vascular anatomy. CONTRAST:  35m OMNIPAQUE IOHEXOL 350 MG/ML SOLN COMPARISON:  None. FINDINGS: Cardiovascular: There is adequate opacification of the pulmonary arterial tree. There is an intraluminal filling defect identified within the posterior basal segmental pulmonary artery of the right lower lobe in keeping with an acute pulmonary embolism. This is best seen on axial image # 240/5 and coronal image # 91/6. The overall embolic burden is small. There is no CT evidence of right heart strain. The central pulmonary arteries are of normal caliber. Extensive multi-vessel coronary artery calcification. Moderate calcification of the aortic valve leaflets. Global cardiac size is within normal limits. No pericardial effusion. Mild atherosclerotic calcification is seen within the thoracic aorta. No aortic aneurysm. Mediastinum/Nodes: The visualized thyroid is unremarkable. No pathologic thoracic adenopathy. Large hiatal hernia.  There is debris within the distal esophagus which may relate to gastroesophageal reflux or esophageal dysmotility. Lungs/Pleura: Mild bibasilar atelectasis is present, right greater than left. Trace right pleural effusion is present. Lungs are otherwise clear. No pneumothorax. No central obstructing lesion. Upper Abdomen: No acute abnormality. Musculoskeletal: No acute bone abnormality. The osseous structures are diffusely osteopenic. Multilevel vertebroplasty has been performed throughout the thoracic spine. No acute bone abnormality. Review of the MIP images confirms the above findings. IMPRESSION: Acute pulmonary embolism involving the posterior basal segment of the right lower lobe. Overall embolic burden is small. No CT evidence of right heart strain. Small right pleural effusion may be reactive in nature. Extensive multi-vessel coronary artery calcification. Moderate calcification of the aortic valve leaflets. Echocardiography  may be helpful to assess the degree of valvular dysfunction. Large hiatal hernia. Small debris within the distal esophagus may relate to esophageal dysmotility or gastroesophageal reflux. Osteopenia with multilevel thoracic vertebroplasty noted. Aortic Atherosclerosis (ICD10-I70.0). Electronically Signed: By: Fidela Salisbury M.D. On: 12/25/2020 14:35   CT Renal Stone Study  Result Date: 12/25/2020 CLINICAL DATA:  Right flank pain EXAM: CT ABDOMEN AND PELVIS WITHOUT CONTRAST TECHNIQUE: Multidetector CT imaging of the abdomen and pelvis was performed following the standard protocol without IV contrast. COMPARISON:  10/03/2020 FINDINGS: Lower chest: Extensive multi-vessel coronary artery calcification. Large hiatal hernia. Trace right pleural effusion. Hepatobiliary: No focal liver abnormality is seen. Status post cholecystectomy. No biliary dilatation. Pancreas: Unremarkable Spleen: Scattered benign calcified granuloma are seen throughout the spleen, unchanged. The spleen is otherwise  unremarkable. Adrenals/Urinary Tract: Adrenal glands are unremarkable. Kidneys are normal, without renal calculi, focal lesion, or hydronephrosis. Bladder is unremarkable. Stomach/Bowel: The appendix is not clearly identified, however, there are no secondary signs of appendicitis within the right lower quadrant of the abdomen. The stomach, large bowel, and small bowel are otherwise unremarkable. No free intraperitoneal gas or fluid. Vascular/Lymphatic: Extensive aortoiliac atherosclerotic calcification. Tortuosity of the abdominal aorta is again noted. No aortic aneurysm. No pathologic adenopathy within the abdomen and pelvis. Reproductive: Uterus and bilateral adnexa are unremarkable. Other: No abdominal wall hernia.  The rectum is unremarkable. Musculoskeletal: The osseous structures are diffusely osteopenic. L2 vertebroplasty again noted. Interval L1 vertebroplasty has been performed. Stable inferior endplate fracture of L3 and superior endplate fracture of L4. Left hip ORIF has been performed. Remote fractures of the a inferior pubic rami are noted bilaterally. No acute bone abnormality. IMPRESSION: No acute intra-abdominal pathology identified. Extensive multi-vessel coronary artery calcification. Trace right pleural effusion. Large hiatal hernia. Diffuse osteopenia with multilevel vertebroplasty within the lumbar spine. Stable endplate fractures of L3 and L4. No acute bone abnormality. Aortic Atherosclerosis (ICD10-I70.0). Electronically Signed   By: Fidela Salisbury M.D.   On: 12/25/2020 14:45      Subjective: Feeling well.  Chest pain is improved.  No dyspnea, no palpitations, no exertional symptoms.  Discharge Exam: Vitals:   12/26/20 1100 12/26/20 1411  BP: 111/61 125/72  Pulse: (!) 57 72  Resp: 17 17  Temp:    SpO2: 98% 97%   Vitals:   12/26/20 0937 12/26/20 1000 12/26/20 1100 12/26/20 1411  BP: (!) 155/85 138/86 111/61 125/72  Pulse: 82 66 (!) 57 72  Resp:  '19 17 17  '$ Temp:       TempSrc:      SpO2:  96% 98% 97%    General: Pt is alert, awake, not in acute distress, ambulating the Cardiovascular: RRR, nl S1-S2, no murmurs appreciated.   No LE edema.   Respiratory: Normal respiratory rate and rhythm.  CTAB without rales or wheezes. Abdominal: Abdomen soft and non-tender.  No distension or HSM.   Neuro/Psych: Strength symmetric in upper and lower extremities.  Judgment and insight appear normal.   The results of significant diagnostics from this hospitalization (including imaging, microbiology, ancillary and laboratory) are listed below for reference.     Microbiology: Recent Results (from the past 240 hour(s))  SARS CORONAVIRUS 2 (TAT 6-24 HRS) Nasopharyngeal Nasopharyngeal Swab     Status: None   Collection Time: 12/25/20 11:03 PM   Specimen: Nasopharyngeal Swab  Result Value Ref Range Status   SARS Coronavirus 2 NEGATIVE NEGATIVE Final    Comment: (NOTE) SARS-CoV-2 target nucleic acids are NOT DETECTED.  The  SARS-CoV-2 RNA is generally detectable in upper and lower respiratory specimens during the acute phase of infection. Negative results do not preclude SARS-CoV-2 infection, do not rule out co-infections with other pathogens, and should not be used as the sole basis for treatment or other patient management decisions. Negative results must be combined with clinical observations, patient history, and epidemiological information. The expected result is Negative.  Fact Sheet for Patients: SugarRoll.be  Fact Sheet for Healthcare Providers: https://www.woods-mathews.com/  This test is not yet approved or cleared by the [place] FDA and  has been authorized for detection and/or diagnosis of SARS-CoV-2 by FDA under an Emergency Use Authorization (EUA). This EUA will remain  in effect (meaning this test can be used) for the duration of the COVID-19 declaration under Se ction 564(b)(1) of the Act, 21  U.S.C. section 360bbb-3(b)(1), unless the authorization is terminated or revoked sooner.  Performed at [facility], [place] [address] [place], [place] [address]      Labs: BNP (last 3 results) No results for input(s): BNP in the last 8760 hours. Basic Metabolic Panel: Recent Labs  Lab 12/25/20 0545 12/26/20 0719  NA 133* 136  K 3.7 3.6  CL 99 103  CO2 28 29  GLUCOSE 129* 111*  BUN 15 7*  CREATININE 0.66 0.49  CALCIUM 8.9 8.6*   Liver Function Tests: Recent Labs  Lab 12/25/20 0545  AST 27  ALT 28  ALKPHOS 59  BILITOT 2.4*  PROT 6.1*  ALBUMIN 3.5   Recent Labs  Lab 12/25/20 0545  LIPASE 29   No results for input(s): AMMONIA in the last 168 hours. CBC: Recent Labs  Lab 12/25/20 0545 12/26/20 0719  WBC 5.5 3.6*  NEUTROABS 4.5  --   HGB 12.4 11.8*  HCT 37.8 35.0*  MCV 115.2* 114.4*  PLT 168 154   Cardiac Enzymes: No results for input(s): CKTOTAL, CKMB, CKMBINDEX, TROPONINI in the last 168 hours. BNP: Invalid input(s): POCBNP CBG: No results for input(s): GLUCAP in the last 168 hours. D-Dimer Recent Labs    12/25/20 1240  DDIMER 0.78*   Hgb A1c No results for input(s): HGBA1C in the last 72 hours. Lipid Profile No results for input(s): CHOL, HDL, LDLCALC, TRIG, CHOLHDL, LDLDIRECT in the last 72 hours. Thyroid function studies No results for input(s): TSH, T4TOTAL, T3FREE, THYROIDAB in the last 72 hours.  Invalid input(s): FREET3 Anemia work up No results for input(s): VITAMINB12, FOLATE, FERRITIN, TIBC, IRON, RETICCTPCT in the last 72 hours. Urinalysis    Component Value Date/Time   COLORURINE YELLOW 11/16/2020 1135   APPEARANCEUR HAZY (A) 11/16/2020 1135   LABSPEC 1.011 11/16/2020 1135   LABSPEC 1.010 05/27/2013 1433   PHURINE 7.0 11/16/2020 1135   GLUCOSEU NEGATIVE 11/16/2020 1135   GLUCOSEU NEGATIVE 09/02/2017 1347   HGBUR NEGATIVE 11/16/2020 1135   HGBUR negative 10/06/2009 1015   BILIRUBINUR NEGATIVE 11/16/2020 1135    BILIRUBINUR Negative 01/21/2018 1618   KETONESUR NEGATIVE 11/16/2020 1135   PROTEINUR 30 (A) 11/16/2020 1135   UROBILINOGEN negative (A) 01/21/2018 1618   UROBILINOGEN 0.2 09/02/2017 1347   UROBILINOGEN 0.2 05/27/2013 1433   NITRITE NEGATIVE 11/16/2020 1135   LEUKOCYTESUR NEGATIVE 11/16/2020 1135   Sepsis Labs Invalid input(s): PROCALCITONIN,  WBC,  LACTICIDVEN Microbiology Recent Results (from the past 240 hour(s))  SARS CORONAVIRUS 2 (TAT 6-24 HRS) Nasopharyngeal Nasopharyngeal Swab     Status: None   Collection Time: 12/25/20 11:03 PM   Specimen: Nasopharyngeal Swab  Result Value Ref Range Status  SARS Coronavirus 2 NEGATIVE NEGATIVE Final    Comment: (NOTE) SARS-CoV-2 target nucleic acids are NOT DETECTED.  The SARS-CoV-2 RNA is generally detectable in upper and lower respiratory specimens during the acute phase of infection. Negative results do not preclude SARS-CoV-2 infection, do not rule out co-infections with other pathogens, and should not be used as the sole basis for treatment or other patient management decisions. Negative results must be combined with clinical observations, patient history, and epidemiological information. The expected result is Negative.  Fact Sheet for Patients: SugarRoll.be  Fact Sheet for Healthcare Providers: https://www.woods-mathews.com/  This test is not yet approved or cleared by the [place] FDA and  has been authorized for detection and/or diagnosis of SARS-CoV-2 by FDA under an Emergency Use Authorization (EUA). This EUA will remain  in effect (meaning this test can be used) for the duration of the COVID-19 declaration under Se ction 564(b)(1) of the Act, 21 U.S.C. section 360bbb-3(b)(1), unless the authorization is terminated or revoked sooner.  Performed at [facility], [place] [address], [place], [place] [address]      Time coordinating discharge: 35 minutes            SIGNED:   Edwin Dada, MD  [facility]

## 2020-12-28 ENCOUNTER — Other Ambulatory Visit: Payer: Self-pay | Admitting: Internal Medicine

## 2020-12-28 NOTE — Telephone Encounter (Signed)
Requesting: lorazepam 0.'5mg'$  Contract: 04/22/2020 UDS: 06/29/2019 Last Visit: 11/21/2020 Next Visit: 02/13/2021 Last Refill: 10/24/2020 #90 and 1RF   Please Advise

## 2020-12-28 NOTE — Telephone Encounter (Signed)
PDMP reviewed, it is 3 days early, but we will go ahead and refill. Also, she was recently released from the hospital, Dx: Pulmonary emboli, needs hospital follow-up.  Please arrange.

## 2020-12-28 NOTE — Telephone Encounter (Signed)
ED f/u scheduled Monday 01/02/21.

## 2020-12-29 ENCOUNTER — Telehealth: Payer: Self-pay | Admitting: Internal Medicine

## 2020-12-29 ENCOUNTER — Telehealth (HOSPITAL_COMMUNITY): Payer: Self-pay

## 2020-12-29 ENCOUNTER — Other Ambulatory Visit (HOSPITAL_COMMUNITY): Payer: Self-pay

## 2020-12-29 ENCOUNTER — Telehealth: Payer: Self-pay

## 2020-12-29 NOTE — Telephone Encounter (Signed)
Transitions of Care Pharmacy  ° °Call attempted for a pharmacy transitions of care follow-up. HIPAA appropriate voicemail was left with call back information provided.  ° °Call attempt #1. Will follow-up in 2-3 days.  °  °

## 2020-12-29 NOTE — Telephone Encounter (Signed)
Medication: LORazepam (ATIVAN) 0.5 MG tablet  Has the patient contacted their pharmacy? No. (If no, request that the patient contact the pharmacy for the refill.) (If yes, when and what did the pharmacy advise?)  Preferred Pharmacy (with phone number or street name): [id number] [address], [address]  [place], [place] [address]  Phone:  [phone]  Fax:  [phone]   Agent: Please be advised that RX refills may take up to 3 business days. We ask that you follow-up with your pharmacy.

## 2020-12-29 NOTE — Telephone Encounter (Signed)
Refills were sent in yesterday 12/28/20.

## 2020-12-30 ENCOUNTER — Telehealth (HOSPITAL_COMMUNITY): Payer: Self-pay

## 2020-12-30 ENCOUNTER — Other Ambulatory Visit (HOSPITAL_COMMUNITY): Payer: Self-pay

## 2020-12-30 NOTE — Telephone Encounter (Signed)
Pharmacy Transitions of Care Follow-up Telephone Call  Date of discharge: 12/26/20  Discharge Diagnosis: PE  How have you been since you were released from the hospital?  Patient has been trying to take it easy since discharge. Patient has a bleeding disorder that makes bleeding easier. Had a hard time getting arm to stop bleeding after being discharged from the hospital but other than that she has had no issues with bleeding since discharge.  Medication changes made at discharge:  - START: Eliquis  Medication changes verified by the patient? Yes    Medication Accessibility:  Home Pharmacy:  [facility] [address]  Was the patient provided with refills on discharged medications? Yes, maintenance dose has refills   Have all prescriptions been transferred from [facility] to home pharmacy?  Yes  Is the patient able to afford medications? No insurance on file for patient, could not find in search. Patient has [employer] insurance. Patient was given copay of $700 at discharge but insurance was not on file. Will send to [facility] to re-bill for correct copay.    Medication Review:  APIXABAN (ELIQUIS)  Apixaban 10 mg BID initiated on 12/26/20. Will switch to apixaban 5 mg BID after 7 days (DATE 01/02/21).  - Discussed importance of taking medication around the same time everyday  - Advised patient of medications to avoid (NSAIDs, ASA)  - Educated that Tylenol (acetaminophen) will be the preferred analgesic to prevent risk of bleeding  - Emphasized importance of monitoring for signs and symptoms of bleeding (abnormal bruising, prolonged bleeding, nose bleeds, bleeding from gums, discolored urine, black tarry stools)  - Advised patient to alert all providers of anticoagulation therapy prior to starting a new medication or having a procedure   Follow-up Appointments:  PCP Hospital f/u appt confirmed?  Scheduled to see Dr. Larose Kells on 01/02/21 @ Fam Med.   [facility] f/u appt  confirmed? Scheduled to see Dr. Marin Olp on 02/15/21 @ Oncology.   If their condition worsens, is the pt aware to call PCP or go to the Emergency Dept.? Yes  Final Patient Assessment: Patient has refills at home pharmacy and follow up scheduled

## 2021-01-02 ENCOUNTER — Ambulatory Visit (INDEPENDENT_AMBULATORY_CARE_PROVIDER_SITE_OTHER): Payer: Medicare Other | Admitting: Internal Medicine

## 2021-01-02 ENCOUNTER — Encounter: Payer: Self-pay | Admitting: Internal Medicine

## 2021-01-02 ENCOUNTER — Other Ambulatory Visit: Payer: Self-pay

## 2021-01-02 ENCOUNTER — Telehealth: Payer: Self-pay

## 2021-01-02 VITALS — BP 126/70 | HR 113 | Temp 97.8°F | Resp 18 | Ht 66.0 in

## 2021-01-02 DIAGNOSIS — I2693 Single subsegmental pulmonary embolism without acute cor pulmonale: Secondary | ICD-10-CM

## 2021-01-02 DIAGNOSIS — I2510 Atherosclerotic heart disease of native coronary artery without angina pectoris: Secondary | ICD-10-CM | POA: Diagnosis not present

## 2021-01-02 DIAGNOSIS — F341 Dysthymic disorder: Secondary | ICD-10-CM | POA: Diagnosis not present

## 2021-01-02 DIAGNOSIS — G8929 Other chronic pain: Secondary | ICD-10-CM | POA: Diagnosis not present

## 2021-01-02 DIAGNOSIS — G4700 Insomnia, unspecified: Secondary | ICD-10-CM

## 2021-01-02 DIAGNOSIS — M549 Dorsalgia, unspecified: Secondary | ICD-10-CM | POA: Diagnosis not present

## 2021-01-02 MED ORDER — HYDROCODONE-ACETAMINOPHEN 5-325 MG PO TABS: 1.0000 | ORAL_TABLET | Freq: Two times a day (BID) | ORAL | 0 refills | Status: DC

## 2021-01-02 MED ORDER — APIXABAN 5 MG PO TABS: 5.0000 mg | ORAL_TABLET | Freq: Two times a day (BID) | ORAL | 1 refills | Status: DC

## 2021-01-02 MED ORDER — LORAZEPAM 0.5 MG PO TABS: 0.5000 mg | ORAL_TABLET | Freq: Three times a day (TID) | ORAL | 0 refills | Status: DC | PRN

## 2021-01-02 NOTE — Telephone Encounter (Signed)
Agree, too early, okay to hold the prescription for Ativan few more days. Will discuss when she comes to the office today

## 2021-01-02 NOTE — Progress Notes (Signed)
Subjective:    Patient ID: [name], female    DOB: July 09, 1929, 87 y.o.   MRN: [id number]  DOS:  01/02/2021 Type of visit - description: ER follow-up  Went to the ER 12/15/2020 with acute onset of chest pain, pain woke her up, located at the right chest and flank. Labs: Na+ 133, hemoglobin 12.4 slightly lower than before, troponins negative, D-dimer slightly positive. COVID-negative. CT chest pursued >>>  acute PE right lower lobe.  Small embolic burden.  Small R pleural effusion. Was admitted and discharged the next day, was a started Eliquis 10 mg B.I.D. now on 5 mg BID    Today the patient reports that the right-sided chest pain quickly resolved. Currently with no fever chills. No difficulty breathing No nausea or vomiting No blood in the stools.  Needs a refill on Ativan. Her chronic back pain is not well controlled, needs a refill on hydrocodone  Review of Systems See above   Past Medical History:  Diagnosis Date   CAD (coronary artery disease)    mild nonobstructive disease on cath in 2003   Cancer [facility])    Cardiomyopathy    Probable Takotsubo, severe CP w/ normal cath in 1994. Severe CP in 2003 w/ widespread T wave inversions on ECG. Cath w/ minimal coronary disease but LV-gram showed periapical severe hypokinesis and basilar hyperkinesia (EF 40%). Last echo in 4/09 confirmed full LV functional recovery with EF 60%, no regional wall motion abnormalities, mild to moderate LVH.   Chronic diastolic CHF (congestive heart failure) ([place]) 09/13/2019   CVA (cerebral infarction)    Depression with anxiety 03/29/2011   lost husband 3'09   DVT (deep venous thrombosis) ([place])    after venous ablation, R leg   Fracture 09/10/2007   L2, status post vertebroplasty of L2 performed by IR   GERD (gastroesophageal reflux disease)    Hemorrhoids    Hiatal hernia 03/29/2011   no nerve problems   Hyperlipidemia    Hyperplastic colon polyp 06/2001   Hypertension    Iron  deficiency anemia due to chronic blood loss 12/30/2017   OA (osteoarthritis) of knee 03/29/2011   w/ bilateral knee pain-not a problem now   OSA (obstructive sleep apnea) 03/29/2011   no cpap used, not a problem now.   Osteoporosis    Otalgia of both ears    Dr. Simeon Craft   Polycythemia    Stroke [facility]) 03/29/2011   CVA x2 -last 10'12-?TIA(visual problems)   Varicose vein     Past Surgical History:  Procedure Laterality Date   APPENDECTOMY     BACK SURGERY     BREAST BIOPSY     BREAST CYST EXCISION     BREAST LUMPECTOMY Left 2013   left stage I left breast cancer   CHOLECYSTECTOMY  04/09/2011   Procedure: LAPAROSCOPIC CHOLECYSTECTOMY WITH INTRAOPERATIVE CHOLANGIOGRAM;  Surgeon: Odis Hollingshead, MD;  Location: [facility];  Service: General;  Laterality: N/A;   Dental Extraction     L maxillary molar   INTRAMEDULLARY (IM) NAIL INTERTROCHANTERIC Left 12/25/2019   Procedure: INTRAMEDULLARY (IM) NAIL INTERTROCHANTRIC;  Surgeon: Paralee Cancel, MD;  Location: [facility];  Service: Orthopedics;  Laterality: Left;   IR KYPHO THORACIC WITH BONE BIOPSY  09/15/2019   IR KYPHO THORACIC WITH BONE BIOPSY  07/13/2020       IR KYPHO THORACIC WITH BONE BIOPSY  07/13/2020   IR VERTEBROPLASTY CERV/THOR BX INC UNI/BIL INC/INJECT/IMAGING  11/10/2019   KYPHOPLASTY N/A 11/16/2020   Procedure:  T11,  L1,KYPHOPLASTY;  Surgeon: Melina Schools, MD;  Location: [place];  Service: Orthopedics;  Laterality: N/A;   KYPHOSIS SURGERY  08/2007   cement used   OVARIAN CYST SURGERY     left   RADIOLOGY WITH ANESTHESIA N/A 11/10/2019   Procedure: VERTEBROPLASTY;  Surgeon: Luanne Bras, MD;  Location: [place];  Service: Radiology;  Laterality: N/A;   TONSILLECTOMY     TOTAL KNEE ARTHROPLASTY  06/2010   left   TUBAL LIGATION      Allergies as of 01/02/2021       Reactions   Penicillins Hives, Rash, Other (See Comments)   Has patient had a PCN reaction causing immediate rash, facial/tongue/throat swelling, SOB or lightheadedness  with hypotension: Yes Has patient had a PCN reaction causing severe rash involving mucus membranes or skin necrosis: yes Has patient had a PCN reaction that required hospitalization: No Has patient had a PCN reaction occurring within the last 10 years: no If all of the above answers are "NO", then may proceed with Cephalosporin use. Other Reaction: OTHER REACTION   Valsartan Itching, Other (See Comments)   Atorvastatin Diarrhea   Carvedilol Other (See Comments)   "teeth hurt when I took it"   Ezetimibe Nausea Only   Fluvastatin Sodium Other (See Comments)   "Can't remember"   Magnesium Hydroxide Other (See Comments)   REACTION: triggers HAs   Meloxicam Other (See Comments)   Pt seeing auro's / spots.     Pneumovax [pneumococcal Polysaccharide Vaccine] Other (See Comments)   Local reaction   Quinapril Hcl Other (See Comments)    "feelings of tiredness"   Simvastatin Diarrhea   Topamax [topiramate] Itching   Vit D-vit E-safflower Oil Other (See Comments)   Headaches        Medication List        Accurate as of January 02, 2021  8:51 PM. If you have any questions, ask your nurse or doctor.          acetaminophen 500 MG tablet Commonly known as: TYLENOL Take 500 mg by mouth every 6 (six) hours as needed.   alendronate 70 MG tablet Commonly known as: FOSAMAX Take 1 tablet (70 mg total) by mouth every 7 (seven) days. Take with a full glass of water on an empty stomach.   apixaban 5 MG Tabs tablet Commonly known as: ELIQUIS Take 1 tablet (5 mg total) by mouth 2 (two) times daily. What changed: Another medication with the same name was removed. Continue taking this medication, and follow the directions you see here. Changed by: Kathlene November, MD   HYDROcodone-acetaminophen 5-325 MG tablet Commonly known as: NORCO/VICODIN Take 1 tablet by mouth 2 (two) times daily.   hydroxyurea 500 MG capsule Commonly known as: HYDREA TAKE 1 CAPSULE BY MOUTH EVERY DAY. MAY TAKE WITH FOOD  TO MINIMIZE GI SIDE EFFECTS What changed: See the new instructions.   lactose free nutrition Liqd Take 237 mLs by mouth daily.   LORazepam 0.5 MG tablet Commonly known as: ATIVAN Take 1 tablet (0.5 mg total) by mouth every 8 (eight) hours as needed for anxiety. What changed:  how much to take how to take this when to take this reasons to take this additional instructions Changed by: Kathlene November, MD   metoprolol tartrate 50 MG tablet Commonly known as: LOPRESSOR Take 1 tablet (50 mg total) by mouth 2 (two) times daily.   nitroGLYCERIN 0.4 MG SL tablet Commonly known as: NITROSTAT Place 1 tablet (0.4 mg total)  under the tongue every 5 (five) minutes x 3 doses as needed for chest pain.   omeprazole 20 MG capsule Commonly known as: PRILOSEC Take 1 capsule (20 mg total) by mouth daily.           Objective:   Physical Exam BP 126/70 (BP Location: Left Arm, Patient Position: Sitting, Cuff Size: Small)   Pulse (!) 113   Temp 97.8 F (36.6 C) (Oral)   Resp 18   Ht '5\' 6"'$  (1.676 m)   SpO2 93%   BMI 17.43 kg/m  General:   Well developed, mild discomfort at rest due to back pain.  Slightly tachycardic. HEENT:  Normocephalic . Face symmetric, atraumatic Lungs:  CTA B Normal respiratory effort, no intercostal retractions, no accessory muscle use. Heart: RRR,  no murmur.  Lower extremities: Calves symmetric, nontender Skin: Not pale. Not jaundice Neurologic:  alert & oriented X3.  Speech normal, gait: Not tested Psych--  Cognition and judgment appear intact.  Cooperative with normal attention span and concentration.  Behavior appropriate. No anxious or depressed appearing.      Assessment    Assessment HTN Dyslipidemia: Intolerant to most medicines including Zetia, Pravachol, Niaspan Depression, anxiety , insomnia - on ativan bid (03-2015 citalopram cause drowsiness, 04-2015 prozac:diarrhea) Hem-Onc: POLYCYTEMIA: sees hem-onc H/o breast ca MSK: ---DJD   ---Osteoporosis:  T score -2.8 (02-2013): declined treatment 06-2017 ---L2 fractures, s/p vertebral plasty 2009, s/p vertebral plasty T 8 07/2020 Migraine headaches - Tylenol #3 (rarely) GI: GERD- intolerant to prevacid, visual disturbances HH, diverticulitis, Colon polyps h/o "functional " LLQ abd pain CV: ---Cardiomyopathy Probable Takotsubo cardiomyopathy: CP w/ normal cath in 1994.   Severe CP  2003 with widespread T wave inversions on ECG.  Cath with minimal coronary disease but LV-gram showed periapical severe hypokinesis and basilar hyperkinesia (EF 40%) >>> ECHO 4/09 confirmed full LV functional recovery with EF 60%, mild LVH. 2012: Nl EF per echo, (-) Lexiscan 05/21/18 ECHO Nl/stable ---Stroke / TIA 2012 ---ICB seizure 10/2019 was rx keppra, self d/c 05/2019 Sleep apnea 2012, , not on CPAP Hematology: ---H/o DVT, right leg after venous ablation --- Pulmonary emboli  dx 12-25-20 Raynaud phenomena, DX 05/2018   PLAN Pulmonary emboli: Recently dx at the ER, unprovoked, had  R chest pain >>>  quickly resolved.  She has a history of a right leg DVT years ago, also history of intracranial bleed 2021. At this point on Eliquis 5 mg twice daily, seems indicated for at least 3 months, RF sent, to see hematology soon. Depression anxiety, insomnia: Ran out of Ativan 0.5 mg sooner than expected (Sig:   1/2-1/2- 1/2 or 1 tab) the patient reports that she has been taking sometimes a whole tablet instead of 1/2 and sometimes she lost the half tablet she does not take. Denies feeling excessively sedated.  Will change Sig  to 1 tab TID prn  Back pain-chronic: Refill hydrocodone.  Current dose is twice daily.  Consider go back on TID Tachycardia: Doubt related to recent PE, she seems to be in pain, ran out of hydrocodone  Time spent 32 minutes, reviewing the recent admission, labs and x-rays. Also managing anxiety, changing her Ativan dosing.   This visit occurred during the SARS-CoV-2 public  health emergency.  Safety protocols were in place, including screening questions prior to the visit, additional usage of staff PPE, and extensive cleaning of exam room while observing appropriate contact time as indicated for disinfecting solutions.

## 2021-01-02 NOTE — Patient Instructions (Addendum)
Recommend to proceed with the following vaccines at your pharmacy:  Covid #4 Shingrix (shingles) Flu shot this fall  Will take the Ativan 1 tablet up to 3 times a day I refilled hydrocodone, take it twice a day as needed for pain.  You can also take Tylenol 500 mg: 2 tablets in between doses of hydrocodone

## 2021-01-02 NOTE — Telephone Encounter (Signed)
Nurse Assessment Nurse: Lissa Hoard, RN, [place] Date/Time Eilene Ghazi Time): 01/01/2021 4:15:49 AM Confirm and document reason for call. If symptomatic, describe symptoms. ---Caller states that she has been out of her medication for several days and is unable to get a refill until 8/26. She has been having panic attacks in the evening. She needs to have her Lorazepam renewed. She is asking for a prescription to be sent to [facility] in [place] on [address]. denies symptoms at this time Does the patient have any new or worsening symptoms? ---Yes Will a triage be completed? ---No Select reason for no triage. ---Patient declined Nurse: Lissa Hoard, RN, Col[place]ta [place] Date/Time Eilene Ghazi Time): 01/01/2021 4:18:08 AM Please select the assessment type ---Request for controlled medication refill Additional Documentation ---Caller states that she has been out of her medication for several days and is unable to get a refill until 8/26. She has been having panic attacks in the evening. She needs to have her Lorazepam renewed. She is asking for a prescription to be sent to [facility] in [place] on [address]. denies symptoms at this time Is there an on-call physician for the client? ---Yes PLEASE NOTE: All timestamps contained within this report are represented as [place] Standard Time. CONFIDENTIALTY NOTICE: This fax transmission is intended only for the addressee. It contains information that is legally privileged, confidential or otherwise protected from use or disclosure. If you are not the intended recipient, you are strictly prohibited from reviewing, disclosing, copying using or disseminating any of this information or taking any action in reliance on or regarding this information. If you have received this fax in error, please notify us immediately by telephone so that we can arrange for its return to [place]. Phone: [phone], Toll-Free: [phone], Fax: [phone] Page: 2 of 2 Call Id: [id number] Nurse  Assessment Do the client directives specifically allow for paging the on-call regarding scheduled drugs? ---No Disp. Time Eilene Ghazi Time) Disposition Final User 12/31/2020 10:30:53 PM FINAL ATTEMPT MADE - no message left Nyoka Cowden RN, Tan[place] 12/31/2020 10:30:59 PM Send to RN Final Attempt Rich Brave, RN, Tanika 01/01/2021 4:18:37 AM Clinical Call Yes Lissa Hoard, RN, Colletta [place]

## 2021-01-02 NOTE — Assessment & Plan Note (Signed)
Pulmonary emboli: Recently dx at the ER, unprovoked, had  R chest pain >>>  quickly resolved.  She has a history of a right leg DVT years ago, also history of intracranial bleed 2021. At this point on Eliquis 5 mg twice daily, seems indicated for at least 3 months, RF sent, to see hematology soon. Depression anxiety, insomnia: Ran out of Ativan 0.5 mg sooner than expected (Sig:   1/2-1/2- 1/2 or 1 tab) the patient reports that she has been taking sometimes a whole tablet instead of 1/2 and sometimes she lost the half tablet she does not take. Denies feeling excessively sedated.  Will change Sig  to 1 tab TID prn  Back pain-chronic: Refill hydrocodone.  Current dose is twice daily.  Consider go back on TID Tachycardia: Doubt related to recent PE, she seems to be in pain, ran out of hydrocodone

## 2021-01-02 NOTE — Telephone Encounter (Signed)
Just noticed Pt is being seen this afternoon at 1:40pm.

## 2021-01-02 NOTE — Telephone Encounter (Signed)
FYI- you refilled lorazepam on 12/28/2020- per pharmacy she isn't due until 01/06/21. Pt has been out of medication for several days. Okay to allow [facility] to refill early?

## 2021-01-04 ENCOUNTER — Other Ambulatory Visit: Payer: Self-pay | Admitting: Hematology & Oncology

## 2021-01-04 DIAGNOSIS — D751 Secondary polycythemia: Secondary | ICD-10-CM

## 2021-01-06 ENCOUNTER — Telehealth: Payer: Self-pay | Admitting: Internal Medicine

## 2021-01-06 NOTE — Telephone Encounter (Signed)
2 boxes of eliquis 5 mg dispensed to patient.   LOT [id number] EXP: 01/2023  Sample log in book filled out. Signed by provider. Samples placed up front for pick up per provider request.

## 2021-01-06 NOTE — Telephone Encounter (Signed)
Thank you Bailey! 

## 2021-01-06 NOTE — Telephone Encounter (Signed)
Today I have the opportunity to take with the patient's mother, at an outside pharmacy they ask for almost $1000 for a month supply of Eliquis.  She cannot afford that. Plan: -They will pick up samples for Eliquis 5 mg twice daily. -Please send a prescription for Eliquis 5 mg twice daily to our pharmacy downstairs. -The patient's daughter will let us know if they cannot get the prescription

## 2021-01-07 IMAGING — DX DG CHEST 2V
2 series · 2 of 2 positions shown · non-contrast
Comparison: 07/01/2017

CLINICAL DATA: Fatigue for several mos.

EXAM:
CHEST - 2 VIEW

[chest pa]
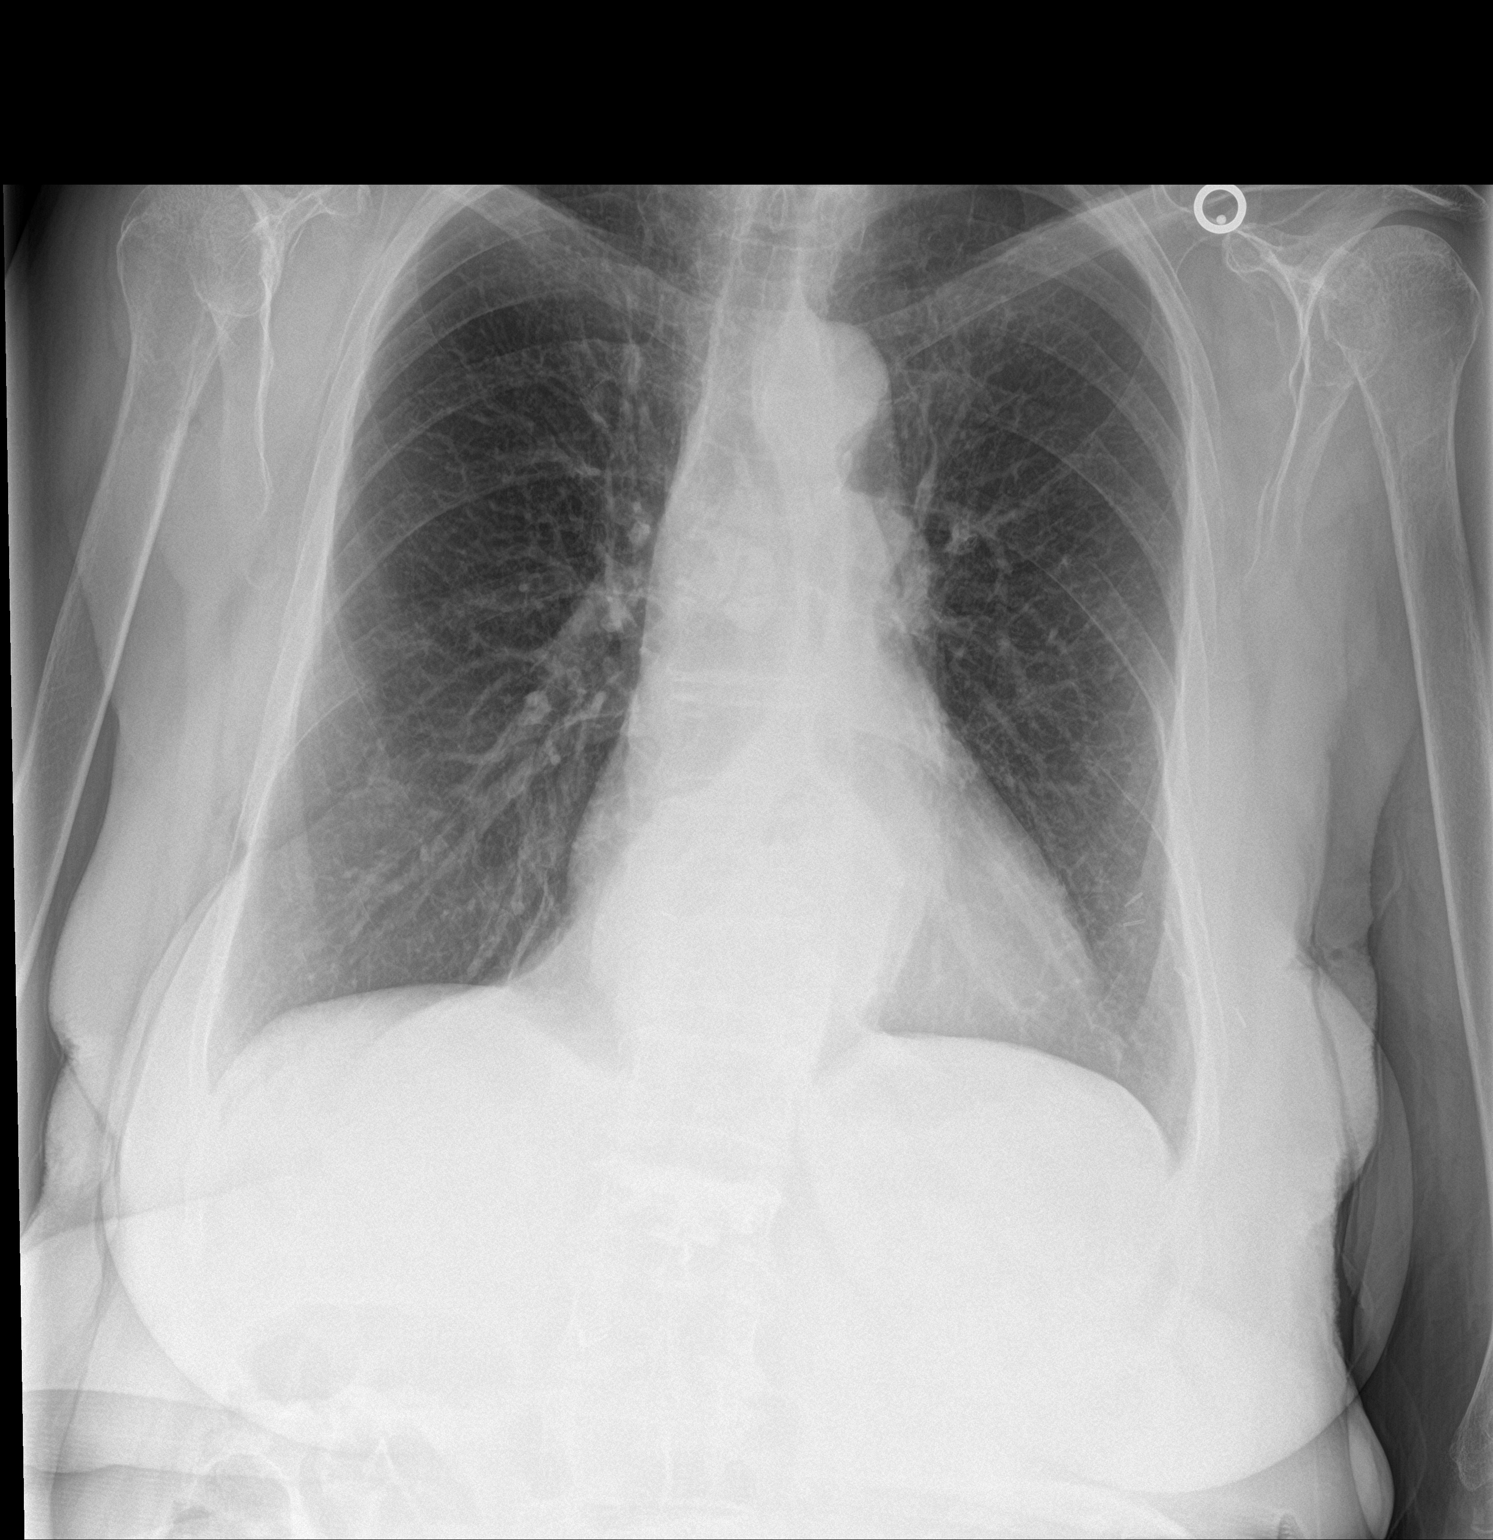

[chest lat]
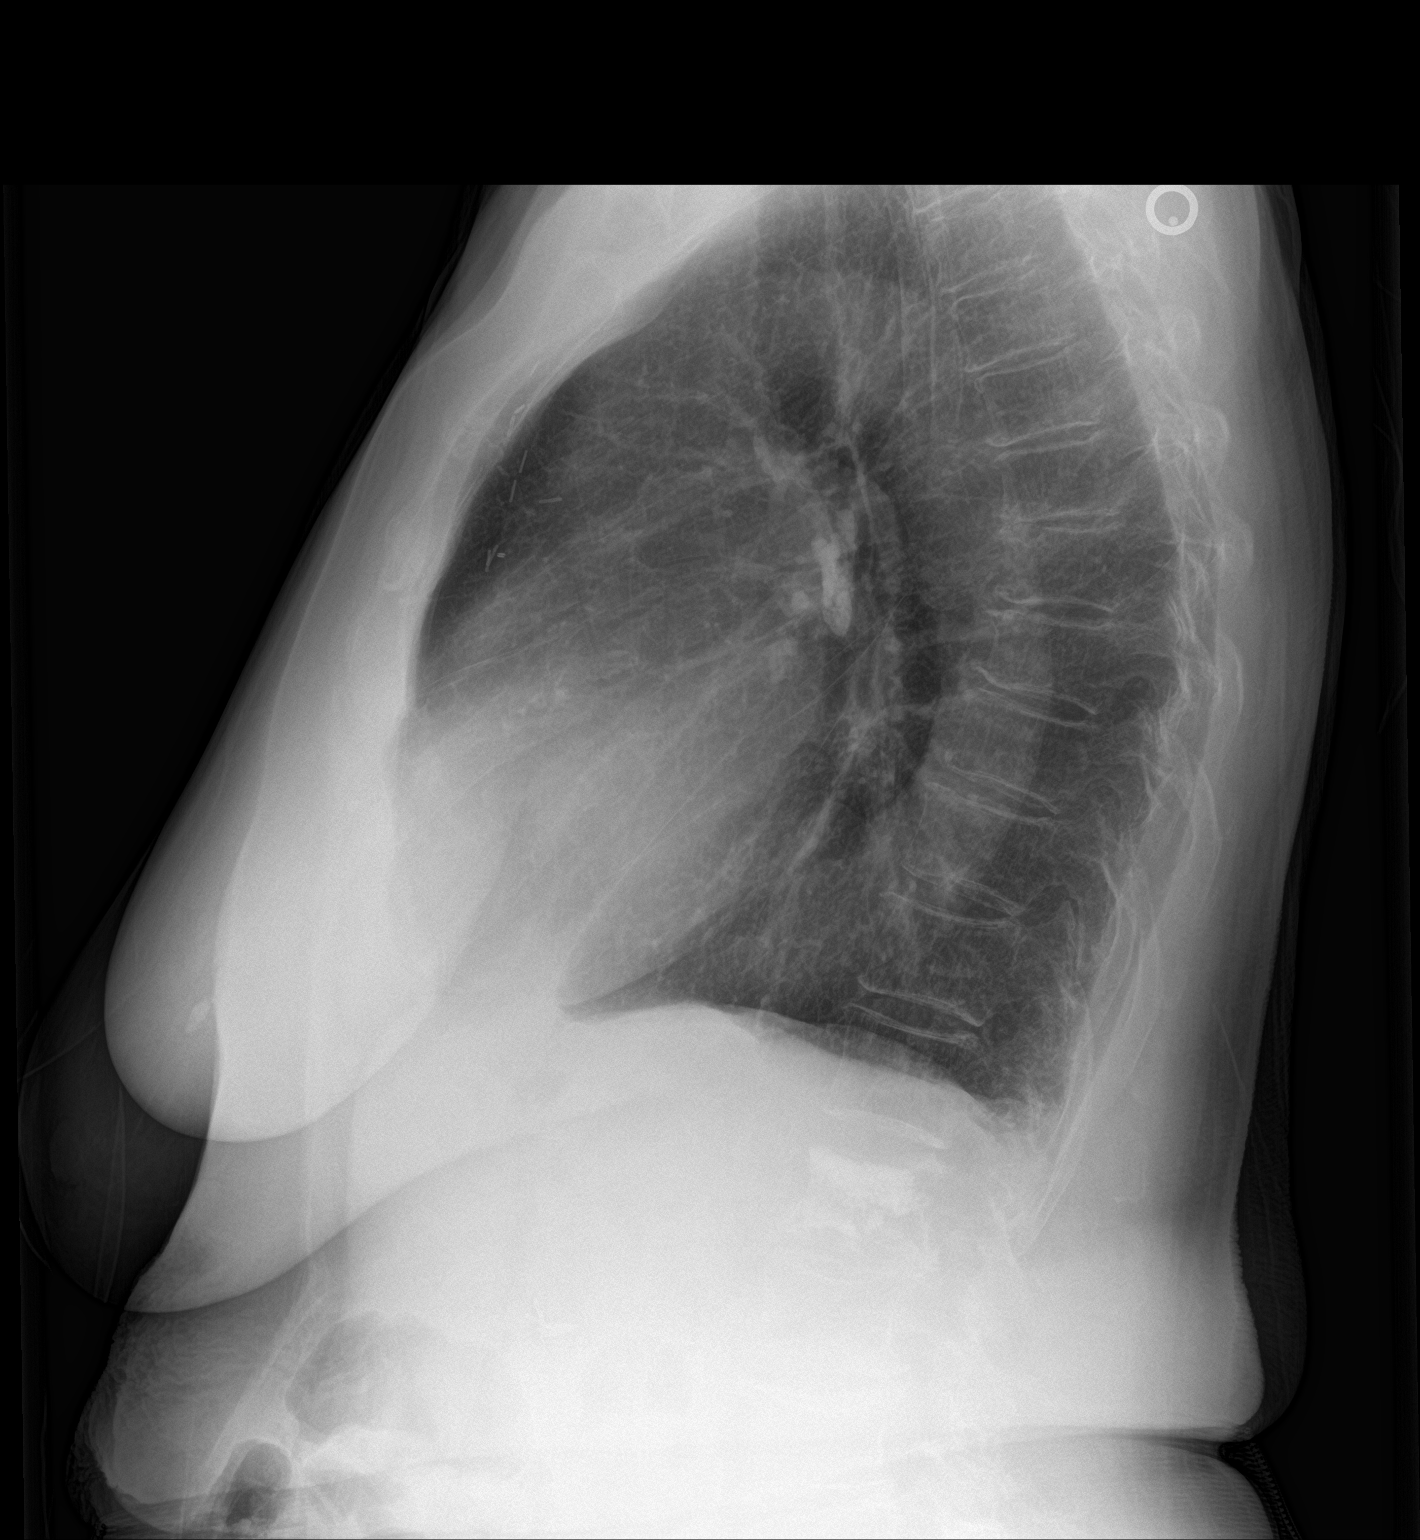

[2 of 2 positions shown; findings below may reference images not displayed]

FINDINGS: Lungs mildly hyperinflated with coarse somewhat attenuated
bronchovascular markings as before. No focal infiltrate or overt
edema.

Heart size upper limits normal. Retrocardiac double density
suggesting hiatal hernia. Tortuous thoracic aorta.

No effusion.

Stable changes of upper lumbar vertebral cement augmentation
IMPRESSION: No acute cardiopulmonary disease.

## 2021-01-09 ENCOUNTER — Encounter: Payer: Self-pay | Admitting: Hematology & Oncology

## 2021-01-09 ENCOUNTER — Other Ambulatory Visit (HOSPITAL_BASED_OUTPATIENT_CLINIC_OR_DEPARTMENT_OTHER): Payer: Self-pay

## 2021-01-22 ENCOUNTER — Other Ambulatory Visit: Payer: Self-pay | Admitting: Hematology & Oncology

## 2021-01-22 DIAGNOSIS — D751 Secondary polycythemia: Secondary | ICD-10-CM

## 2021-01-23 ENCOUNTER — Encounter: Payer: Self-pay | Admitting: Hematology & Oncology

## 2021-01-23 ENCOUNTER — Telehealth: Payer: Self-pay | Admitting: Internal Medicine

## 2021-01-23 ENCOUNTER — Telehealth: Payer: Self-pay | Admitting: *Deleted

## 2021-01-23 NOTE — Telephone Encounter (Signed)
Patient called to speak to Judson Roch [place],NP, regarding symptoms she is having while taking Eliquis. I instructed her that she needs to call downstairs to [facility] since, Dr. Larose Kells is the provider that started you on Eliquis. She verbalized understanding.

## 2021-01-23 NOTE — Telephone Encounter (Signed)
Pt called in and stated she is having side effects from new medication apixaban (ELIQUIS) 5 MG TABS tablet such as tiredness. She would like for someone to contact her at [phone]. Please advise.

## 2021-01-24 ENCOUNTER — Ambulatory Visit (HOSPITAL_BASED_OUTPATIENT_CLINIC_OR_DEPARTMENT_OTHER)
Admission: RE | Admit: 2021-01-24 | Discharge: 2021-01-24 | Disposition: A | Discharge status: Home / Self Care | Payer: Medicare Other | Source: Ambulatory Visit | Attending: Family | Admitting: Family

## 2021-01-24 ENCOUNTER — Other Ambulatory Visit: Payer: Self-pay

## 2021-01-24 ENCOUNTER — Ambulatory Visit (INDEPENDENT_AMBULATORY_CARE_PROVIDER_SITE_OTHER): Payer: Medicare Other | Admitting: Family

## 2021-01-24 VITALS — BP 149/74 | HR 66 | Temp 97.8°F | Resp 16 | Wt 110.0 lb

## 2021-01-24 DIAGNOSIS — R21 Rash and other nonspecific skin eruption: Secondary | ICD-10-CM | POA: Diagnosis not present

## 2021-01-24 DIAGNOSIS — Z853 Personal history of malignant neoplasm of breast: Secondary | ICD-10-CM

## 2021-01-24 DIAGNOSIS — R0781 Pleurodynia: Secondary | ICD-10-CM | POA: Diagnosis not present

## 2021-01-24 DIAGNOSIS — I2510 Atherosclerotic heart disease of native coronary artery without angina pectoris: Secondary | ICD-10-CM

## 2021-01-24 DIAGNOSIS — N644 Mastodynia: Secondary | ICD-10-CM

## 2021-01-24 DIAGNOSIS — R0789 Other chest pain: Secondary | ICD-10-CM | POA: Insufficient documentation

## 2021-01-24 MED ORDER — BETAMETHASONE DIPROPIONATE 0.05 % EX CREA
TOPICAL_CREAM | Freq: Two times a day (BID) | CUTANEOUS | 0 refills | Status: DC
Start: 2021-01-24 — End: 2021-06-13

## 2021-01-24 NOTE — Telephone Encounter (Signed)
Patient has been scheduled to see Melissa this afternoon. Earliest available appt.

## 2021-01-24 NOTE — Telephone Encounter (Signed)
Agree, needs to be seen ASAP.  I do not have any openings until very late today, could she be seen by another provider early?  If not >>  ER.

## 2021-01-24 NOTE — Assessment & Plan Note (Signed)
New.  Normal breast exam. Will obtain diagnostic mammogram and [place].

## 2021-01-24 NOTE — Telephone Encounter (Signed)
Advise patient: That would be a very unusual side effect from Eliquis.  Do watch for blood in the urine, blood in the stools, stomach pain. If she has a new onset or unusually severe fatigue needs to be seen. Rec not to stop Eliquis.

## 2021-01-24 NOTE — Patient Instructions (Signed)
Please complete x-ray on the first floor.   You should be contacted about scheduling your appointment for mammogram of your left breast. Please keep your upcoming appointment with Dr. Larose Kells.   You can apply betamethasone cream to rash twice daily as needed.

## 2021-01-24 NOTE — Progress Notes (Signed)
Subjective:   By signing my name below, I, [name], attest that this documentation has been prepared under the direction and in the presence of Debbrah Alar, NP, 01/24/2021   Patient ID: [name], female    DOB: 1929/06/16, 87 y.o.   MRN: [id number]  Chief Complaint  Patient presents with   Flank Pain    Complains of right side pain, "under ribs"    HPI Patient is in today for an office visit.   Rib pain: She is complaining of upper abdomen pain within her ribs. She denies any direct injuries to the area but has acknowledged a few falls. She has experienced a rib pain before that she had CT scans to access but she notes this is not similar pain. She is concerned about it being her liver or an organ in that area.  Left breast pain: She has a history of breast cancer in her left breast that she had a partial mastectomy to remove. She is experiencing discomfort in this area again and is interested in a mammogram to rule out cancer again.  Rash: She has a rash on her left upper arm and on the top of her left breast.  Blood pressure: Her blood pressure is recording at 138/85 in the office today.  BP Readings from Last 3 Encounters:  01/24/21 (!) 149/74  01/02/21 126/70  12/26/20 125/72    Back and hip procedure: She has a piece of hardware sticking out from a hip procedure that is bothering her physically now. She complains it is especially worse at night as it keeps her from getting comfortable. She is interested in exploring options to repair this. She plans to discuss this further with her PCP at her upcoming appointment.   Anesthesia concern: She is concerned about anesthesia causing memory loss if she were to go under for another procedure.    Health Maintenance Due  Topic Date Due   Zoster Vaccines- Shingrix (1 of 2) Never done   COVID-19 Vaccine (4 - Booster) 07/21/2020   INFLUENZA VACCINE  12/12/2020    Past Medical History:  Diagnosis Date   CAD  (coronary artery disease)    mild nonobstructive disease on cath in 2003   Cancer [facility])    Cardiomyopathy    Probable Takotsubo, severe CP w/ normal cath in 1994. Severe CP in 2003 w/ widespread T wave inversions on ECG. Cath w/ minimal coronary disease but LV-gram showed periapical severe hypokinesis and basilar hyperkinesia (EF 40%). Last echo in 4/09 confirmed full LV functional recovery with EF 60%, no regional wall motion abnormalities, mild to moderate LVH.   Chronic diastolic CHF (congestive heart failure) ([place]) 09/13/2019   CVA (cerebral infarction)    Depression with anxiety 03/29/2011   lost husband 3'09   DVT (deep venous thrombosis) ([place])    after venous ablation, R leg   Fracture 09/10/2007   L2, status post vertebroplasty of L2 performed by IR   GERD (gastroesophageal reflux disease)    Hemorrhoids    Hiatal hernia 03/29/2011   no nerve problems   Hyperlipidemia    Hyperplastic colon polyp 06/2001   Hypertension    Iron deficiency anemia due to chronic blood loss 12/30/2017   OA (osteoarthritis) of knee 03/29/2011   w/ bilateral knee pain-not a problem now   OSA (obstructive sleep apnea) 03/29/2011   no cpap used, not a problem now.   Osteoporosis    Otalgia of both ears    Dr. Simeon Craft  Polycythemia    Stroke ([place]) 03/29/2011   CVA x2 -last 10'12-?TIA(visual problems)   Varicose vein     Past Surgical History:  Procedure Laterality Date   APPENDECTOMY     BACK SURGERY     BREAST BIOPSY     BREAST CYST EXCISION     BREAST LUMPECTOMY Left 2013   left stage I left breast cancer   CHOLECYSTECTOMY  04/09/2011   Procedure: LAPAROSCOPIC CHOLECYSTECTOMY WITH INTRAOPERATIVE CHOLANGIOGRAM;  Surgeon: Odis Hollingshead, MD;  Location: [facility];  Service: General;  Laterality: N/A;   Dental Extraction     L maxillary molar   INTRAMEDULLARY (IM) NAIL INTERTROCHANTERIC Left 12/25/2019   Procedure: INTRAMEDULLARY (IM) NAIL INTERTROCHANTRIC;  Surgeon: Paralee Cancel, MD;   Location: [facility];  Service: Orthopedics;  Laterality: Left;   IR KYPHO THORACIC WITH BONE BIOPSY  09/15/2019   IR KYPHO THORACIC WITH BONE BIOPSY  07/13/2020       IR KYPHO THORACIC WITH BONE BIOPSY  07/13/2020   IR VERTEBROPLASTY CERV/THOR BX INC UNI/BIL INC/INJECT/IMAGING  11/10/2019   KYPHOPLASTY N/A 11/16/2020   Procedure: T11,  L1,KYPHOPLASTY;  Surgeon: Melina Schools, MD;  Location: [place];  Service: Orthopedics;  Laterality: N/A;   KYPHOSIS SURGERY  08/2007   cement used   OVARIAN CYST SURGERY     left   RADIOLOGY WITH ANESTHESIA N/A 11/10/2019   Procedure: VERTEBROPLASTY;  Surgeon: Luanne Bras, MD;  Location: [facility];  Service: Radiology;  Laterality: N/A;   TONSILLECTOMY     TOTAL KNEE ARTHROPLASTY  06/2010   left   TUBAL LIGATION      Family History  Problem Relation Age of Onset   Breast cancer Mother    Heart disease Father        MIs   Colon cancer Other        cousin   Breast cancer Sister    Leukemia Brother        GM    Social History   Socioeconomic History   Marital status: Widowed    Spouse name: Not on file   Number of children: 3   Years of education: Not on file   Highest education level: Not on file  Occupational History   Occupation: n/a  Tobacco Use   Smoking status: Former    Packs/day: 1.00    Years: 37.00    Pack years: 37.00    Types: Cigarettes    Start date: 06/26/1951    Quit date: 05/14/1988    Years since quitting: 32.7   Smokeless tobacco: Never   Tobacco comments:    quit 25 years ago  Vaping Use   Vaping Use: Never used  Substance and Sexual Activity   Alcohol use: Not Currently    Alcohol/week: 1.0 standard drink    Types: 1 Glasses of wine per week    Comment: occasional/social   Drug use: No   Sexual activity: Not Currently  Other Topics Concern   Not on file  Social History Narrative   Lost husband 07/29/07-    Lives in [place] in a town house by herself, has 3 daughters, lost Mickel Baas (03-2017)   [place] ( [place]     Not driving as of [id number]            Social Determinants of Health   Financial Resource Strain: Not on file  Food Insecurity: Not on file  Transportation Needs: Not on file  Physical  Activity: Not on file  Stress: Not on file  Social Connections: Not on file  Intimate Partner Violence: Not on file    Outpatient Medications Prior to Visit  Medication Sig Dispense Refill   acetaminophen (TYLENOL) 500 MG tablet Take 500 mg by mouth every 6 (six) hours as needed.     alendronate (FOSAMAX) 70 MG tablet Take 1 tablet (70 mg total) by mouth every 7 (seven) days. Take with a full glass of water on an empty stomach. 12 tablet 3   apixaban (ELIQUIS) 5 MG TABS tablet Take 1 tablet (5 mg total) by mouth 2 (two) times daily. 60 tablet 1   HYDROcodone-acetaminophen (NORCO/VICODIN) 5-325 MG tablet Take 1 tablet by mouth 2 (two) times daily. 60 tablet 0   hydroxyurea (HYDREA) 500 MG capsule TAKE 1 CAPSULE BY MOUTH EVERY DAY. MAY TAKE WITH FOOD TO MINIMIZE GI SIDE EFFECTS 15 capsule 0   lactose free nutrition (BOOST) LIQD Take 237 mLs by mouth daily.     LORazepam (ATIVAN) 0.5 MG tablet Take 1 tablet (0.5 mg total) by mouth every 8 (eight) hours as needed for anxiety. 90 tablet 0   metoprolol tartrate (LOPRESSOR) 50 MG tablet Take 1 tablet (50 mg total) by mouth 2 (two) times daily. 180 tablet 1   nitroGLYCERIN (NITROSTAT) 0.4 MG SL tablet Place 1 tablet (0.4 mg total) under the tongue every 5 (five) minutes x 3 doses as needed for chest pain. 25 tablet 3   omeprazole (PRILOSEC) 20 MG capsule Take 1 capsule (20 mg total) by mouth daily. 30 capsule 0   No facility-administered medications prior to visit.    Allergies  Allergen Reactions   Penicillins Hives, Rash and Other (See Comments)    Has patient had a PCN reaction causing immediate rash, facial/tongue/throat swelling, SOB or lightheadedness with hypotension: Yes Has patient had a PCN reaction causing severe rash  involving mucus membranes or skin necrosis: yes Has patient had a PCN reaction that required hospitalization: No Has patient had a PCN reaction occurring within the last 10 years: no If all of the above answers are "NO", then may proceed with Cephalosporin use.  Other Reaction: OTHER REACTION   Valsartan Itching and Other (See Comments)   Atorvastatin Diarrhea   Carvedilol Other (See Comments)    "teeth hurt when I took it"    Ezetimibe Nausea Only   Fluvastatin Sodium Other (See Comments)    "Can't remember"   Magnesium Hydroxide Other (See Comments)    REACTION: triggers HAs   Meloxicam Other (See Comments)    Pt seeing auro's / spots.     Pneumovax [Pneumococcal Polysaccharide Vaccine] Other (See Comments)    Local reaction   Quinapril Hcl Other (See Comments)     "feelings of tiredness"   Simvastatin Diarrhea   Topamax [Topiramate] Itching   Vit D-Vit E-Safflower Oil Other (See Comments)    Headaches    Review of Systems  Gastrointestinal:  Positive for nausea.  Musculoskeletal:  Positive for back pain and falls. Myalgias: Has experienced a few falls.      (+) hip pain  (+) rib pain  Skin:  Positive for rash (Upper left arm and left side of chest).  Neurological:  Seizures: Her back pain is coming from her hip pain.      Objective:    Physical Exam Constitutional:      General: She is not in acute distress.    Appearance: Normal appearance. She is not ill-appearing.  HENT:  Head: Normocephalic and atraumatic.     Right Ear: External ear normal.     Left Ear: External ear normal.  Eyes:     Extraocular Movements: Extraocular movements intact.     Pupils: Pupils are equal, round, and reactive to light.  Cardiovascular:     Rate and Rhythm: Normal rate and regular rhythm.     Heart sounds: Normal heart sounds. No murmur heard.   No gallop.  Pulmonary:     Effort: Pulmonary effort is normal. No respiratory distress.     Breath sounds: Normal breath sounds.  No wheezing or rales.  Lymphadenopathy:     Cervical: No cervical adenopathy.  Skin:    General: Skin is warm and dry.     Findings: Rash present.     Comments: (+) rash on left upper arm and left side of chest  Neurological:     Mental Status: She is alert and oriented to person, place, and time.  Psychiatric:        Behavior: Behavior normal.        Judgment: Judgment normal.  Breast: no masses noted left breast.   BP (!) 149/74 (BP Location: Right Arm, Patient Position: Sitting, Cuff Size: Small)   Pulse 66   Temp 97.8 F (36.6 C) (Oral)   Resp 16   Wt 110 lb (49.9 kg)   SpO2 97%   BMI 17.75 kg/m  Wt Readings from Last 3 Encounters:  01/24/21 110 lb (49.9 kg)  12/01/20 108 lb (49 kg)  11/16/20 110 lb (49.9 kg)       Assessment & Plan:   Problem List Items Addressed This Visit       Unprioritized   Skin rash    New. Trial of betamethasone cream.       Rib pain on right side    She has had multiple falls. Will obtain a rib x-ray to rule out rib fracure.       Relevant Orders   DG Ribs Unilateral W/Chest Right   Mastalgia    New.  Normal breast exam. Will obtain diagnostic mammogram and [place].      Relevant Orders   MM Digital Diagnostic Bilat   US BREAST COMPLETE UNI LEFT INC AXILLA   Other Visit Diagnoses     History of breast cancer    -  Primary   Relevant Orders   MM Digital Diagnostic Bilat   US BREAST COMPLETE UNI LEFT INC AXILLA       Meds ordered this encounter  Medications   betamethasone dipropionate 0.05 % cream    Sig: Apply topically 2 (two) times daily.    Dispense:  30 g    Refill:  0    Order Specific Question:   Supervising Provider    Answer:   Penni Homans A [[id number]]    I, Debbrah Alar, NP, personally preformed the services described in this documentation.  All medical record entries made by the scribe were at my direction and in my presence.  I have reviewed the chart and discharge instructions (if applicable) and agree  that the record reflects my personal performance and is accurate and complete. 01/24/2021   I,[name],acting as a Education administrator for Nance Pear, NP.,have documented all relevant documentation on the behalf of Nance Pear, NP,as directed by  Nance Pear, NP while in the presence of Nance Pear, NP.    Nance Pear, NP

## 2021-01-24 NOTE — Assessment & Plan Note (Signed)
New. Trial of betamethasone cream.

## 2021-01-24 NOTE — Assessment & Plan Note (Signed)
She has had multiple falls. Will obtain a rib x-ray to rule out rib fracure.

## 2021-01-24 NOTE — Telephone Encounter (Signed)
Last 3-4 days had a lot of pain in chest and rip cage on right side where blood clot was. Hardly move she is so sore. She states she isnt sure what you would be able to do but she feels like she needs to be seen. Will call patient back with advise.

## 2021-01-25 ENCOUNTER — Telehealth: Payer: Self-pay | Admitting: Family

## 2021-01-25 NOTE — Telephone Encounter (Signed)
Patient advised of results, provider's advised and instructions to call if she develops a rash. She verbalized understanding.

## 2021-01-25 NOTE — Telephone Encounter (Signed)
Please advise pt that x-ray is negative for rib fracture.  Possible causes for pain include her pulmonary embolus (which was located at the base of the right lung) or musculoskeletal pain. I did not see any rash at time of her exam, but she should keep an eye out for any rash in that area and let us know if it occurs. I recommend tylenol as needed for pain.

## 2021-02-01 IMAGING — MR MR HEAD W/O CM
10 of 11 series · 45 of 48 positions shown · non-contrast
Comparison: 05/03/2010

CLINICAL DATA: Single episode of near syncope 1 week ago

EXAM:
MRI HEAD WITHOUT CONTRAST
TECHNIQUE: Multiplanar, multiecho pulse sequences of the brain and surrounding
structures were obtained without intravenous contrast.

[Series 2: T1 · sagittal · 5.0mm · 0.90mm/px · 3 of 27 slices shown]
[im 1/27]
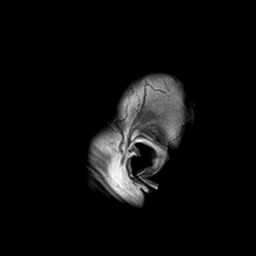
[im 14/27]
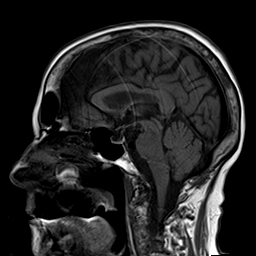
[im 27/27]
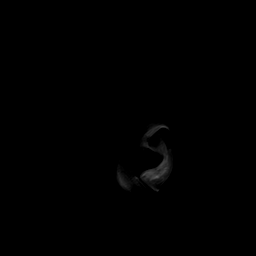

[Series 3: DWI · axial · 3.0mm · 1.80mm/px · z∈[-67,+88]mm · 8 of 95 slices shown (1 of 4)]
[im 1/95]
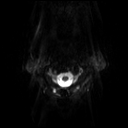
[im 14/95]
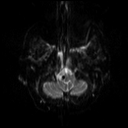
[im 27/95]
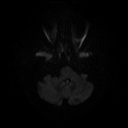
[im 41/95]
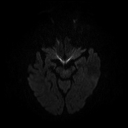
[im 54/95]
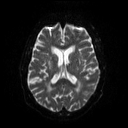
[im 68/95]
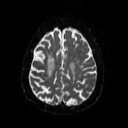
[im 81/95]
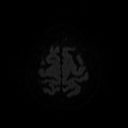
[im 95/95]
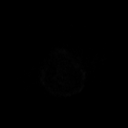

[Series 4: DWI · axial · 3.0mm · 1.80mm/px · z∈[-67,+88]mm · 4 of 47 slices shown (2 of 4)]
[im 1/47]
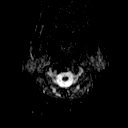
[im 16/47]
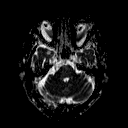
[im 31/47]
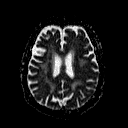
[im 47/47]
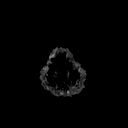

[Series 5: DWI · coronal · 3.0mm · 1.46mm/px · 8 of 92 slices shown (3 of 4)]
[im 1/92]
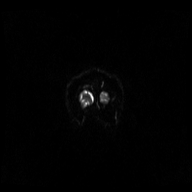
[im 14/92]
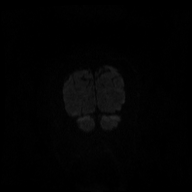
[im 27/92]
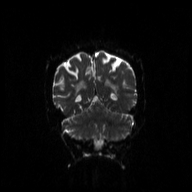
[im 40/92]
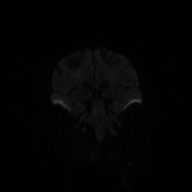
[im 53/92]
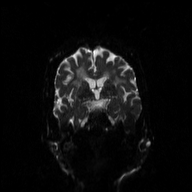
[im 66/92]
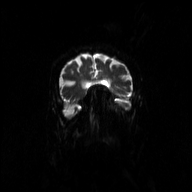
[im 79/92]
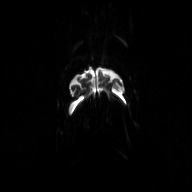
[im 92/92]
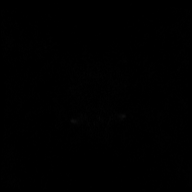

[Series 6: DWI · coronal · 3.0mm · 1.46mm/px · 4 of 48 slices shown (4 of 4)]
[im 1/48]
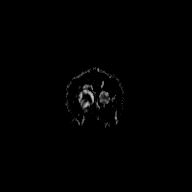
[im 16/48]
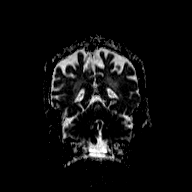
[im 32/48]
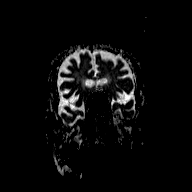
[im 48/48]
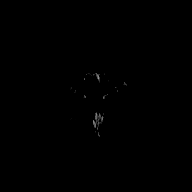

[Series 7: T2 · axial · 5.0mm · 0.69mm/px · z∈[-71,+91]mm · 2 of 28 slices shown (1 of 3)]
[im 1/28]
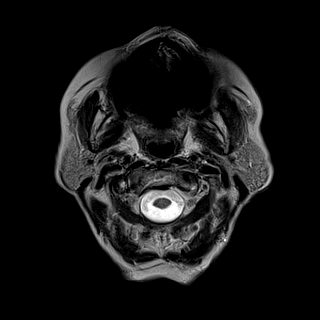
[im 28/28]
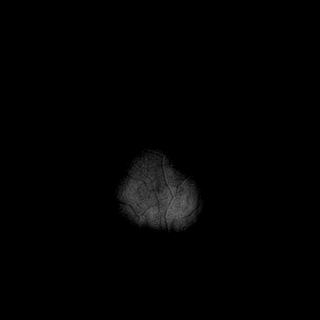

[Series 8: T2 · axial · 5.0mm · 0.43mm/px · z∈[-71,+91]mm · 2 of 28 slices shown (2 of 3)]
[im 1/28]
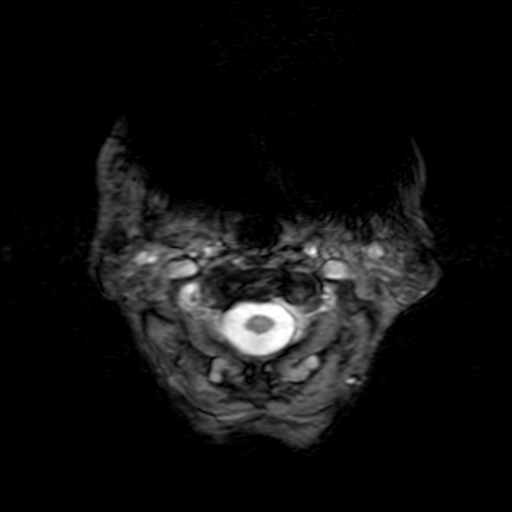
[im 28/28]
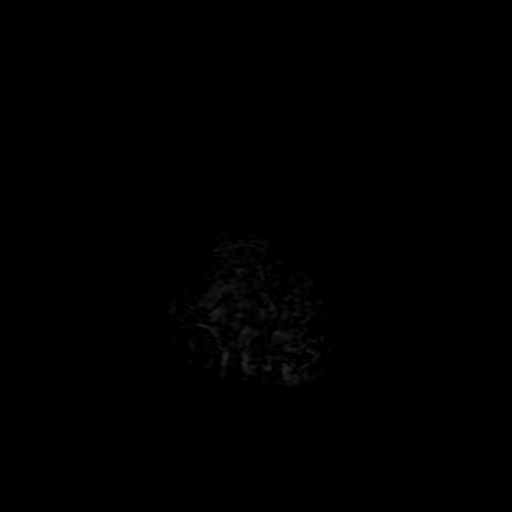

[Series 9: FLAIR · axial · 3.0mm · 0.43mm/px · z∈[-68,+88]mm · 4 of 40 slices shown]
[im 1/40]
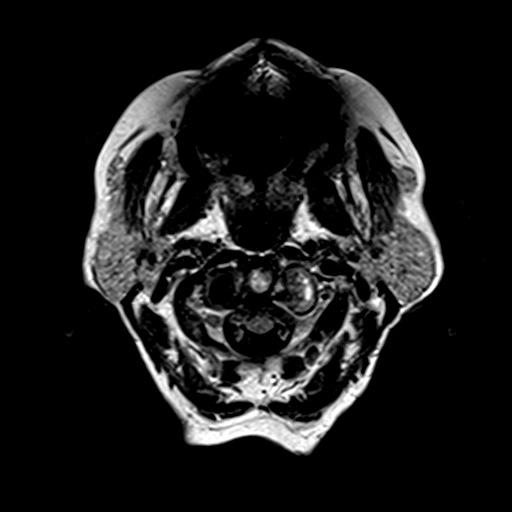
[im 14/40]
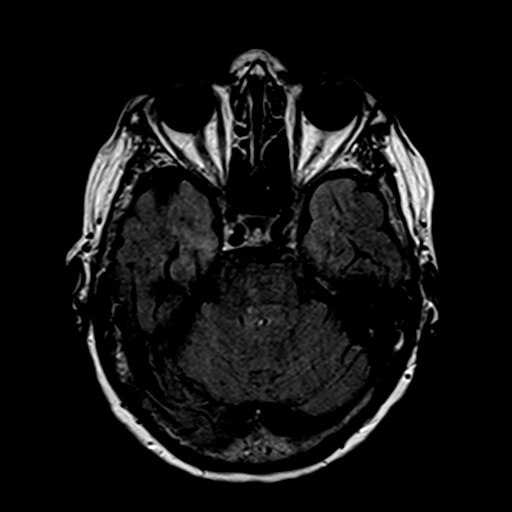
[im 27/40]
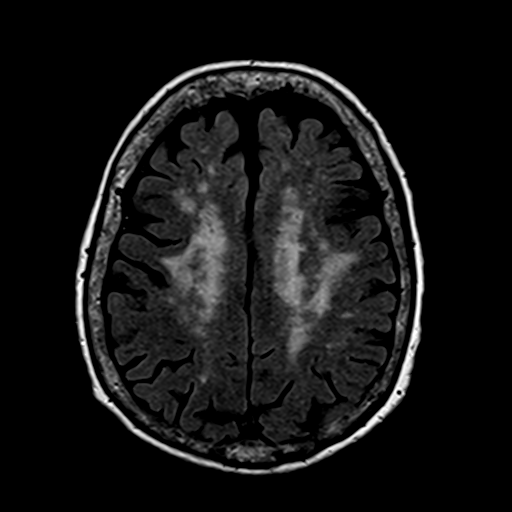
[im 40/40]
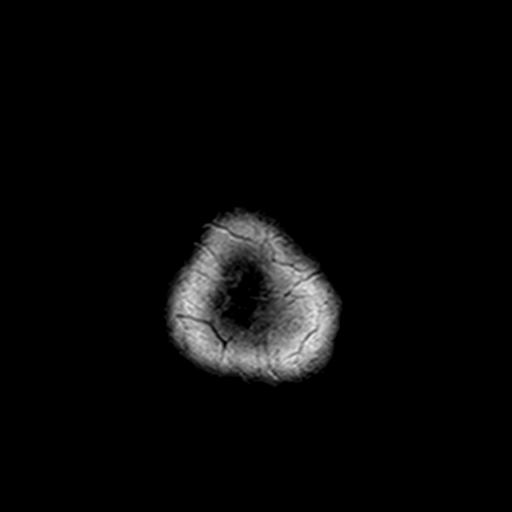

[Series 10: t1_3d_tra · axial · 2.0mm · 0.90mm/px · z∈[-70,+104]mm · 8 of 88 slices shown]
[im 1/88]
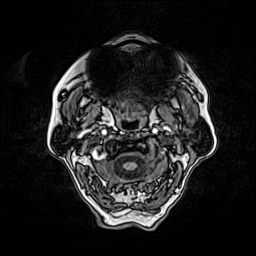
[im 13/88]
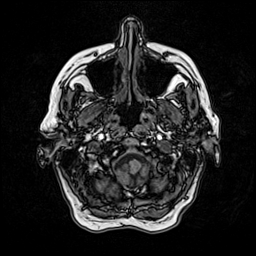
[im 25/88]
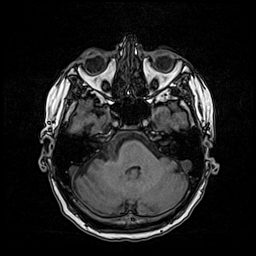
[im 38/88]
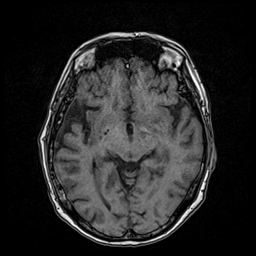
[im 50/88]
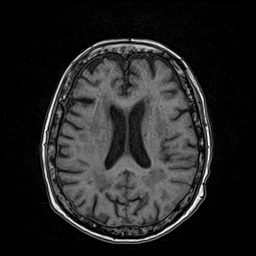
[im 63/88]
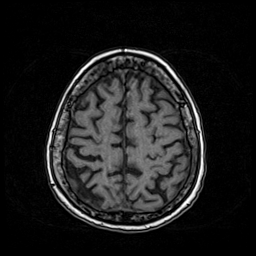
[im 75/88]
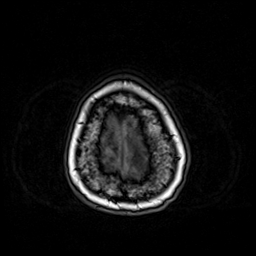
[im 88/88]
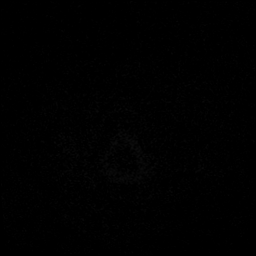

[Series 11: T2 · coronal · 5.0mm · 0.69mm/px · 2 of 28 slices shown (3 of 3)]
[im 1/28]
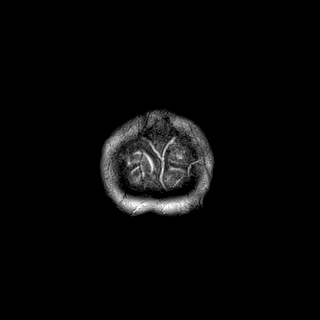
[im 28/28]
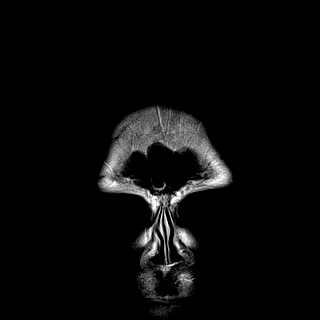

[45 of 48 positions shown; findings below may reference images not displayed]

FINDINGS: Brain: No acute infarction, hemorrhage, hydrocephalus, extra-axial
collection or mass lesion. Moderate chronic small vessel ischemic
gliosis in the cerebral white matter. Remote lacunar infarct in the
right thalamus. Age congruent cerebral volume loss with mild
progression since prior

Vascular: Major flow voids are preserved. Ectatic right vertebral
artery impressing on the brainstem.

Skull and upper cervical spine: Negative for marrow lesion. Cervical
facet spurring and disc narrowing.

Sinuses/Orbits: Stable.

Other: Small cysts with fluid level in the superficial left parotid,
essentially stable from 7877
IMPRESSION: 1. Moderate chronic small vessel ischemia with mild progression
since [DATE]. Age congruent cerebral volume loss with expected progression from
3. No acute or reversible finding.

## 2021-02-06 ENCOUNTER — Other Ambulatory Visit: Payer: Self-pay | Admitting: Hematology & Oncology

## 2021-02-06 DIAGNOSIS — D751 Secondary polycythemia: Secondary | ICD-10-CM

## 2021-02-09 ENCOUNTER — Telehealth: Payer: Self-pay | Admitting: Internal Medicine

## 2021-02-09 NOTE — Telephone Encounter (Signed)
Medication: LORazepam (ATIVAN) 0.5 MG tablet   Has the patient contacted their pharmacy? No. (If no, request that the patient contact the pharmacy for the refill.) (If yes, when and what did the pharmacy advise?)  Preferred Pharmacy (with phone number or street name):  [facility] [address], [address]  [place], [place] [address]  Phone:  [phone]  Fax:  [phone]

## 2021-02-09 NOTE — Telephone Encounter (Signed)
Requesting: lorazepam 0.5mg  Contract: 04/22/2020 UDS: 06/29/2019 Last Visit: 01/02/2021 Next Visit: 02/13/2021 Last Refill: 01/02/2021 #90 and 0RF   Please Advise

## 2021-02-10 MED ORDER — LORAZEPAM 0.5 MG PO TABS: 0.5000 mg | ORAL_TABLET | Freq: Two times a day (BID) | ORAL | 0 refills | Status: DC | PRN

## 2021-02-10 NOTE — Telephone Encounter (Signed)
PDMP okay, I noted that she had falls recently.  Decrease Ativan to twice daily.  We will discuss in person when she comes back

## 2021-02-13 ENCOUNTER — Encounter: Payer: Self-pay | Admitting: Internal Medicine

## 2021-02-13 ENCOUNTER — Ambulatory Visit (INDEPENDENT_AMBULATORY_CARE_PROVIDER_SITE_OTHER): Payer: Medicare Other | Admitting: Internal Medicine

## 2021-02-13 ENCOUNTER — Other Ambulatory Visit: Payer: Self-pay

## 2021-02-13 VITALS — BP 122/68 | HR 72 | Temp 98.1°F | Resp 18 | Ht 66.0 in | Wt 109.1 lb

## 2021-02-13 DIAGNOSIS — M549 Dorsalgia, unspecified: Secondary | ICD-10-CM | POA: Diagnosis not present

## 2021-02-13 DIAGNOSIS — I10 Essential (primary) hypertension: Secondary | ICD-10-CM

## 2021-02-13 DIAGNOSIS — F32A Depression, unspecified: Secondary | ICD-10-CM

## 2021-02-13 DIAGNOSIS — G4700 Insomnia, unspecified: Secondary | ICD-10-CM

## 2021-02-13 DIAGNOSIS — G8929 Other chronic pain: Secondary | ICD-10-CM | POA: Diagnosis not present

## 2021-02-13 DIAGNOSIS — F341 Dysthymic disorder: Secondary | ICD-10-CM

## 2021-02-13 DIAGNOSIS — F419 Anxiety disorder, unspecified: Secondary | ICD-10-CM | POA: Diagnosis not present

## 2021-02-13 DIAGNOSIS — Z23 Encounter for immunization: Secondary | ICD-10-CM

## 2021-02-13 DIAGNOSIS — Z79899 Other long term (current) drug therapy: Secondary | ICD-10-CM

## 2021-02-13 DIAGNOSIS — I2510 Atherosclerotic heart disease of native coronary artery without angina pectoris: Secondary | ICD-10-CM | POA: Diagnosis not present

## 2021-02-13 DIAGNOSIS — I2699 Other pulmonary embolism without acute cor pulmonale: Secondary | ICD-10-CM | POA: Diagnosis not present

## 2021-02-13 MED ORDER — METOPROLOL TARTRATE 50 MG PO TABS: 50.0000 mg | ORAL_TABLET | Freq: Two times a day (BID) | ORAL | 1 refills | Status: DC

## 2021-02-13 MED ORDER — HYDROCODONE-ACETAMINOPHEN 5-325 MG PO TABS: 1.0000 | ORAL_TABLET | Freq: Two times a day (BID) | ORAL | 0 refills | Status: DC

## 2021-02-13 NOTE — Patient Instructions (Addendum)
Recommend to proceed with the following vaccines at your pharmacy:  Covid #4 Shingrix (shingles)  Continue taking the medications as you are doing  Provide a urine sample  GO TO THE FRONT DESK, [name] back for a checkup in 3 months

## 2021-02-13 NOTE — Progress Notes (Signed)
Subjective:    Patient ID: [name], female    DOB: 1929/09/16, 87 y.o.   MRN: [id number]  DOS:  02/13/2021 Type of visit - description: Acute  Since the last visit she is feeling about the same. She brought a list of questions for me.  One of the question was about a pain she has at the forehead, for the last year, every few days, last 20 minutes. Description: Hervey Ard? She is not sure;   "Is not a headache".  No associated with eye tearing.  She continue with pain at the L hip and feels a screw is sticking out.  Wonders if she could have surgery  Review of Systems See above   Past Medical History:  Diagnosis Date   CAD (coronary artery disease)    mild nonobstructive disease on cath in 2003   Cancer [facility])    Cardiomyopathy    Probable Takotsubo, severe CP w/ normal cath in 1994. Severe CP in 2003 w/ widespread T wave inversions on ECG. Cath w/ minimal coronary disease but LV-gram showed periapical severe hypokinesis and basilar hyperkinesia (EF 40%). Last echo in 4/09 confirmed full LV functional recovery with EF 60%, no regional wall motion abnormalities, mild to moderate LVH.   Chronic diastolic CHF (congestive heart failure) ([place]) 09/13/2019   CVA (cerebral infarction)    Depression with anxiety 03/29/2011   lost husband 3'09   DVT (deep venous thrombosis) ([place])    after venous ablation, R leg   Fracture 09/10/2007   L2, status post vertebroplasty of L2 performed by IR   GERD (gastroesophageal reflux disease)    Hemorrhoids    Hiatal hernia 03/29/2011   no nerve problems   Hyperlipidemia    Hyperplastic colon polyp 06/2001   Hypertension    Iron deficiency anemia due to chronic blood loss 12/30/2017   OA (osteoarthritis) of knee 03/29/2011   w/ bilateral knee pain-not a problem now   OSA (obstructive sleep apnea) 03/29/2011   no cpap used, not a problem now.   Osteoporosis    Otalgia of both ears    Dr. Simeon Craft   Polycythemia    Stroke [facility]) 03/29/2011   CVA  x2 -last 10'12-?TIA(visual problems)   Varicose vein     Past Surgical History:  Procedure Laterality Date   APPENDECTOMY     BACK SURGERY     BREAST BIOPSY     BREAST CYST EXCISION     BREAST LUMPECTOMY Left 2013   left stage I left breast cancer   CHOLECYSTECTOMY  04/09/2011   Procedure: LAPAROSCOPIC CHOLECYSTECTOMY WITH INTRAOPERATIVE CHOLANGIOGRAM;  Surgeon: Odis Hollingshead, MD;  Location: [facility];  Service: General;  Laterality: N/A;   Dental Extraction     L maxillary molar   INTRAMEDULLARY (IM) NAIL INTERTROCHANTERIC Left 12/25/2019   Procedure: INTRAMEDULLARY (IM) NAIL INTERTROCHANTRIC;  Surgeon: Paralee Cancel, MD;  Location: [facility];  Service: Orthopedics;  Laterality: Left;   IR KYPHO THORACIC WITH BONE BIOPSY  09/15/2019   IR KYPHO THORACIC WITH BONE BIOPSY  07/13/2020       IR KYPHO THORACIC WITH BONE BIOPSY  07/13/2020   IR VERTEBROPLASTY CERV/THOR BX INC UNI/BIL INC/INJECT/IMAGING  11/10/2019   KYPHOPLASTY N/A 11/16/2020   Procedure: T11,  L1,KYPHOPLASTY;  Surgeon: Melina Schools, MD;  Location: [place];  Service: Orthopedics;  Laterality: N/A;   KYPHOSIS SURGERY  08/2007   cement used   OVARIAN CYST SURGERY     left   RADIOLOGY WITH ANESTHESIA  N/A 11/10/2019   Procedure: VERTEBROPLASTY;  Surgeon: Luanne Bras, MD;  Location: [place];  Service: Radiology;  Laterality: N/A;   TONSILLECTOMY     TOTAL KNEE ARTHROPLASTY  06/2010   left   TUBAL LIGATION      Allergies as of 02/13/2021       Reactions   Penicillins Hives, Rash, Other (See Comments)   Has patient had a PCN reaction causing immediate rash, facial/tongue/throat swelling, SOB or lightheadedness with hypotension: Yes Has patient had a PCN reaction causing severe rash involving mucus membranes or skin necrosis: yes Has patient had a PCN reaction that required hospitalization: No Has patient had a PCN reaction occurring within the last 10 years: no If all of the above answers are "NO", then may proceed with  Cephalosporin use. Other Reaction: OTHER REACTION   Valsartan Itching, Other (See Comments)   Atorvastatin Diarrhea   Carvedilol Other (See Comments)   "teeth hurt when I took it"   Ezetimibe Nausea Only   Fluvastatin Sodium Other (See Comments)   "Can't remember"   Magnesium Hydroxide Other (See Comments)   REACTION: triggers HAs   Meloxicam Other (See Comments)   Pt seeing auro's / spots.     Pneumovax [pneumococcal Polysaccharide Vaccine] Other (See Comments)   Local reaction   Quinapril Hcl Other (See Comments)    "feelings of tiredness"   Simvastatin Diarrhea   Topamax [topiramate] Itching   Vit D-vit E-safflower Oil Other (See Comments)   Headaches        Medication List        Accurate as of February 13, 2021 11:59 PM. If you have any questions, ask your nurse or doctor.          acetaminophen 500 MG tablet Commonly known as: TYLENOL Take 500 mg by mouth every 6 (six) hours as needed.   alendronate 70 MG tablet Commonly known as: FOSAMAX Take 1 tablet (70 mg total) by mouth every 7 (seven) days. Take with a full glass of water on an empty stomach.   apixaban 5 MG Tabs tablet Commonly known as: ELIQUIS Take 1 tablet (5 mg total) by mouth 2 (two) times daily.   betamethasone dipropionate 0.05 % cream Apply topically 2 (two) times daily.   HYDROcodone-acetaminophen 5-325 MG tablet Commonly known as: NORCO/VICODIN Take 1 tablet by mouth 2 (two) times daily.   hydroxyurea 500 MG capsule Commonly known as: HYDREA TAKE 1 CAPSULE BY MOUTH EVERY DAY. MAY TAKE WITH FOOD TO MINIMIZE GI SIDE EFFECTS   lactose free nutrition Liqd Take 237 mLs by mouth daily.   LORazepam 0.5 MG tablet Commonly known as: ATIVAN Take 1 tablet (0.5 mg total) by mouth 2 (two) times daily as needed for anxiety.   metoprolol tartrate 50 MG tablet Commonly known as: LOPRESSOR Take 1 tablet (50 mg total) by mouth 2 (two) times daily.   nitroGLYCERIN 0.4 MG SL tablet Commonly known  as: NITROSTAT Place 1 tablet (0.4 mg total) under the tongue every 5 (five) minutes x 3 doses as needed for chest pain.   omeprazole 20 MG capsule Commonly known as: PRILOSEC Take 1 capsule (20 mg total) by mouth daily.           Objective:   Physical Exam BP 122/68 (BP Location: Left Arm, Patient Position: Sitting, Cuff Size: Small)   Pulse 72   Temp 98.1 F (36.7 C) (Oral)   Resp 18   Ht 5\' 6"  (1.676 m)   Wt 109 lb  2 oz (49.5 kg)   SpO2 95%   BMI 17.61 kg/m  General:   Well developed, NAD, BMI noted. HEENT:  Normocephalic . Face symmetric, atraumatic.  Palpation of the forehead, orbits is normal. Lower extremities: no pretibial edema bilaterally.   Left hip: I do feel a prominence at the lateral aspect of the hip Skin: Not pale. Not jaundice Neurologic:  alert & oriented X3.  Speech normal, gait not tested. Psych--  Cognition and judgment appear intact.  Cooperative with normal attention span and concentration.  Behavior appropriate. No anxious or depressed appearing.      Assessment     Assessment HTN Dyslipidemia: Intolerant to most medicines including Zetia, Pravachol, Niaspan Depression, anxiety , insomnia - on ativan bid (03-2015 citalopram cause drowsiness, 04-2015 prozac:diarrhea) Hem-Onc: POLYCYTEMIA: sees hem-onc H/o breast ca MSK: ---DJD  ---Osteoporosis:  T score -2.8 (02-2013): declined treatment 06-2017 ---L2 fractures, s/p vertebral plasty 2009, s/p vertebral plasty T 8 07/2020 Migraine headaches - Tylenol #3 (rarely) GI: GERD- intolerant to prevacid, visual disturbances HH, diverticulitis, Colon polyps h/o "functional " LLQ abd pain CV: ---Cardiomyopathy Probable Takotsubo cardiomyopathy: CP w/ normal cath in 1994.   Severe CP  2003 with widespread T wave inversions on ECG.  Cath with minimal coronary disease but LV-gram showed periapical severe hypokinesis and basilar hyperkinesia (EF 40%) >>> ECHO 4/09 confirmed full LV functional  recovery with EF 60%, mild LVH. 2012: Nl EF per echo, (-) Lexiscan 05/21/18 ECHO Nl/stable ---Stroke / TIA 2012 ---ICB seizure 10/2019 was rx keppra, self d/c 05/2019 Sleep apnea 2012, , not on CPAP Hematology: ---H/o DVT, right leg after venous ablation --- Pulmonary emboli  dx 12-25-20 Raynaud phenomena, DX 05/2018   PLAN Forehead pain: As described above, recommend observation Back pain, chronic pain, L hip pain: Overall symptoms are controlled with hydrocodone twice daily.  PDMP okay, RF sent. She has pain at the left hip, she thinks is related to a "screw" that is sticking out (placed during surgery 12-2019). Wonders if she could have surgery to remove it.  Advise she is very high risk for surgery and  general anesthesia particularly now that she is taking Eliquis.  I think she realizes risk>>benefits for surgery. Depression, anxiety, insomnia: Since the last visit, Ativan dose was decreased to BID, due to history of falls.  She is compliant with my advice. Pulmonary emboli: dx 12-25-20, she also had a history of right leg DVT years ago, history of intracranial bleed 2021, currently on Eliquis . Recommend to discuss with hematology for how long she needs to be anticoagulated. If cost of Eliquis is a major issue, she could get assistance through the clinical pharmacist in our office. RTC 3 months  Today I spent 30 minutes with the patient, reviewing the chart; she obviously has a number of medical problems and she is frail.  Listening therapy provided.  This visit occurred during the SARS-CoV-2 public health emergency.  Safety protocols were in place, including screening questions prior to the visit, additional usage of staff PPE, and extensive cleaning of exam room while observing appropriate contact time as indicated for disinfecting solutions.

## 2021-02-14 NOTE — Assessment & Plan Note (Signed)
Forehead pain: As described above, recommend observation Back pain, chronic pain, L hip pain: Overall symptoms are controlled with hydrocodone twice daily.  PDMP okay, RF sent. She has pain at the left hip, she thinks is related to a "screw" that is sticking out (placed during surgery 12-2019). Wonders if she could have surgery to remove it.  Advise she is very high risk for surgery and  general anesthesia particularly now that she is taking Eliquis.  I think she realizes risk>>benefits for surgery. Depression, anxiety, insomnia: Since the last visit, Ativan dose was decreased to BID, due to history of falls.  She is compliant with my advice. Pulmonary emboli: dx 12-25-20, she also had a history of right leg DVT years ago, history of intracranial bleed 2021, currently on Eliquis . Recommend to discuss with hematology for how long she needs to be anticoagulated. If cost of Eliquis is a major issue, she could get assistance through the clinical pharmacist in our office. RTC 3 months

## 2021-02-15 ENCOUNTER — Inpatient Hospital Stay: Payer: Medicare Other

## 2021-02-15 ENCOUNTER — Other Ambulatory Visit: Payer: Self-pay

## 2021-02-15 ENCOUNTER — Inpatient Hospital Stay (HOSPITAL_BASED_OUTPATIENT_CLINIC_OR_DEPARTMENT_OTHER): Payer: Medicare Other | Admitting: Hematology & Oncology

## 2021-02-15 ENCOUNTER — Encounter: Payer: Self-pay | Admitting: Hematology & Oncology

## 2021-02-15 ENCOUNTER — Telehealth: Payer: Self-pay | Admitting: *Deleted

## 2021-02-15 ENCOUNTER — Inpatient Hospital Stay: Payer: Medicare Other | Attending: Hematology & Oncology

## 2021-02-15 ENCOUNTER — Other Ambulatory Visit (HOSPITAL_BASED_OUTPATIENT_CLINIC_OR_DEPARTMENT_OTHER): Payer: Self-pay

## 2021-02-15 VITALS — BP 140/95 | HR 63 | Temp 97.8°F | Resp 18 | Wt 109.0 lb

## 2021-02-15 DIAGNOSIS — D500 Iron deficiency anemia secondary to blood loss (chronic): Secondary | ICD-10-CM

## 2021-02-15 DIAGNOSIS — D45 Polycythemia vera: Secondary | ICD-10-CM | POA: Insufficient documentation

## 2021-02-15 DIAGNOSIS — Z7901 Long term (current) use of anticoagulants: Secondary | ICD-10-CM | POA: Diagnosis not present

## 2021-02-15 DIAGNOSIS — Z853 Personal history of malignant neoplasm of breast: Secondary | ICD-10-CM | POA: Insufficient documentation

## 2021-02-15 DIAGNOSIS — I2699 Other pulmonary embolism without acute cor pulmonale: Secondary | ICD-10-CM | POA: Diagnosis not present

## 2021-02-15 DIAGNOSIS — D751 Secondary polycythemia: Secondary | ICD-10-CM

## 2021-02-15 DIAGNOSIS — D509 Iron deficiency anemia, unspecified: Secondary | ICD-10-CM | POA: Diagnosis not present

## 2021-02-15 LAB — CMP (CANCER CENTER ONLY)
ALT: 27 U/L (ref 0–44)
AST: 27 U/L (ref 15–41)
Albumin: 4 g/dL (ref 3.5–5.0)
Alkaline Phosphatase: 57 U/L (ref 38–126)
Anion gap: 6 (ref 5–15)
BUN: 16 mg/dL (ref 8–23)
CO2: 34 mmol/L — ABNORMAL HIGH (ref 22–32)
Calcium: 10.3 mg/dL (ref 8.9–10.3)
Chloride: 98 mmol/L (ref 98–111)
Creatinine: 0.86 mg/dL (ref 0.44–1.00)
GFR, Estimated: >60 mL/min (ref >60)
Glucose, Bld: 143 mg/dL — ABNORMAL HIGH (ref 70–99)
Potassium: 4.2 mmol/L (ref 3.5–5.1)
Sodium: 138 mmol/L (ref 135–145)
Total Bilirubin: 1.4 mg/dL — ABNORMAL HIGH (ref 0.3–1.2)
Total Protein: 6.3 g/dL — ABNORMAL LOW (ref 6.5–8.1)

## 2021-02-15 LAB — CBC WITH DIFFERENTIAL (CANCER CENTER ONLY)
Abs Immature Granulocytes: 0.01 10*3/uL (ref 0.00–0.07)
Basophils Absolute: 0 10*3/uL (ref 0.0–0.1)
Basophils Relative: 1 %
Eosinophils Absolute: 0.1 10*3/uL (ref 0.0–0.5)
Eosinophils Relative: 2 %
HCT: 38.3 % (ref 36.0–46.0)
Hemoglobin: 12.9 g/dL (ref 12.0–15.0)
Immature Granulocytes: 0 %
Lymphocytes Relative: 18 %
Lymphs Abs: 0.6 10*3/uL — ABNORMAL LOW (ref 0.7–4.0)
MCH: 39.7 pg — ABNORMAL HIGH (ref 26.0–34.0)
MCHC: 33.7 g/dL (ref 30.0–36.0)
MCV: 117.8 fL — ABNORMAL HIGH (ref 80.0–100.0)
Monocytes Absolute: 0.4 10*3/uL (ref 0.1–1.0)
Monocytes Relative: 11 %
Neutro Abs: 2.2 10*3/uL (ref 1.7–7.7)
Neutrophils Relative %: 68 %
Platelet Count: 176 10*3/uL (ref 150–400)
RBC: 3.25 MIL/uL — ABNORMAL LOW (ref 3.87–5.11)
RDW: 12.5 % (ref 11.5–15.5)
WBC Count: 3.2 10*3/uL — ABNORMAL LOW (ref 4.0–10.5)
nRBC: 0 % (ref 0.0–0.2)

## 2021-02-15 LAB — LACTATE DEHYDROGENASE: LDH: 129 U/L (ref 98–192)

## 2021-02-15 MED ORDER — RIVAROXABAN 15 MG PO TABS
15.0000 mg | ORAL_TABLET | Freq: Every day | ORAL | 4 refills | Status: DC
  Filled 2021-02-15: qty 30, 30d supply, fill #0
  Filled 2021-03-21 – 2021-03-22 (×3): qty 30, 30d supply, fill #1
  Filled 2021-05-02: qty 30, 30d supply, fill #2

## 2021-02-15 MED ORDER — RIVAROXABAN 10 MG PO TABS: 10.0000 mg | ORAL_TABLET | Freq: Every day | ORAL | 4 refills | Status: DC

## 2021-02-15 NOTE — Progress Notes (Signed)
Hematology and Oncology Follow Up Visit  [name] [id number] [birth year] 87 y.o. 02/15/2021   Principle Diagnosis:  Polycythemia vera - JAK2 positive Stage I (T1bN0M0 ) infiltrating duct carcinoma the left breast  Iron deficiency anemia -- symptomatic Pulmonary embolism-11/30/2020   Past Therapy: Femara 2.5 mg by mouth daily - stopped 08/2015   Current Therapy:        Phlebotomy to maintain hematocrit below 45% Hydrea 500 mg by mouth daily -- re-started on 07/16/2019 --DC on 02/15/2021 IV Iron as needed Xarelto 15 mg p.o. daily-change from Eliquis on 02/15/2021   Interim History: [name] is here today for follow-up.  New problem is that she had a pulmonary embolism.  A lot of this is from Megace that she was off her appetite.  This was back in July.  She was placed on Eliquis.  The Eliquis is very expensive for her.  We want to try to make this a little more reasonable for her.  We will try her on some Xarelto.  I do not think that the polycythemia had anything to do with the pulmonary embolism.  Her blood counts have been very well controlled.  I want her off the Hydrea right now.  I think her blood counts are adequate for being off Hydrea.  She looks quite frail.  She says that she still lives at home.  She has somebody come out a couple times a week to try to help her out.  She does have her orthopedic issues.  I think she has compression fractures.  She had kyphoplasty at T11 and L1.  I think this helped a little bit.  She has had no problems with bowels or bladder.  She has had no problems with nausea or vomiting.  She just does not eat all that much.  Overall, her performance status is ECOG 2-3.       Medications:  Allergies as of 02/15/2021       Reactions   Penicillins Hives, Rash, Other (See Comments)   Has patient had a PCN reaction causing immediate rash, facial/tongue/throat swelling, SOB or lightheadedness with hypotension: Yes Has patient had a PCN  reaction causing severe rash involving mucus membranes or skin necrosis: yes Has patient had a PCN reaction that required hospitalization: No Has patient had a PCN reaction occurring within the last 10 years: no If all of the above answers are "NO", then may proceed with Cephalosporin use. Other Reaction: OTHER REACTION   Valsartan Itching, Other (See Comments)   Atorvastatin Diarrhea   Carvedilol Other (See Comments)   "teeth hurt when I took it"   Ezetimibe Nausea Only   Fluvastatin Sodium Other (See Comments)   "Can't remember"   Magnesium Hydroxide Other (See Comments)   REACTION: triggers HAs   Meloxicam Other (See Comments)   Pt seeing auro's / spots.     Pneumovax [pneumococcal Polysaccharide Vaccine] Other (See Comments)   Local reaction   Quinapril Hcl Other (See Comments)    "feelings of tiredness"   Simvastatin Diarrhea   Topamax [topiramate] Itching   Vit D-vit E-safflower Oil Other (See Comments)   Headaches        Medication List        Accurate as of February 15, 2021  2:32 PM. If you have any questions, ask your nurse or doctor.          STOP taking these medications    apixaban 5 MG Tabs tablet Commonly known as: ELIQUIS Stopped  by: Volanda Napoleon, MD   hydroxyurea 500 MG capsule Commonly known as: HYDREA Stopped by: Volanda Napoleon, MD       TAKE these medications    acetaminophen 500 MG tablet Commonly known as: TYLENOL Take 500 mg by mouth every 6 (six) hours as needed.   alendronate 70 MG tablet Commonly known as: FOSAMAX Take 1 tablet (70 mg total) by mouth every 7 (seven) days. Take with a full glass of water on an empty stomach.   betamethasone dipropionate 0.05 % cream Apply topically 2 (two) times daily.   HYDROcodone-acetaminophen 5-325 MG tablet Commonly known as: NORCO/VICODIN Take 1 tablet by mouth 2 (two) times daily.   lactose free nutrition Liqd Take 237 mLs by mouth daily.   LORazepam 0.5 MG tablet Commonly known  as: ATIVAN Take 1 tablet (0.5 mg total) by mouth 2 (two) times daily as needed for anxiety.   metoprolol tartrate 50 MG tablet Commonly known as: LOPRESSOR Take 1 tablet (50 mg total) by mouth 2 (two) times daily.   nitroGLYCERIN 0.4 MG SL tablet Commonly known as: NITROSTAT Place 1 tablet (0.4 mg total) under the tongue every 5 (five) minutes x 3 doses as needed for chest pain.   omeprazole 20 MG capsule Commonly known as: PRILOSEC Take 1 capsule (20 mg total) by mouth daily.   Rivaroxaban 15 MG Tabs tablet Commonly known as: XARELTO Take 1 tablet (15 mg total) by mouth daily. Started by: Volanda Napoleon, MD        Allergies:  Allergies  Allergen Reactions   Penicillins Hives, Rash and Other (See Comments)    Has patient had a PCN reaction causing immediate rash, facial/tongue/throat swelling, SOB or lightheadedness with hypotension: Yes Has patient had a PCN reaction causing severe rash involving mucus membranes or skin necrosis: yes Has patient had a PCN reaction that required hospitalization: No Has patient had a PCN reaction occurring within the last 10 years: no If all of the above answers are "NO", then may proceed with Cephalosporin use.  Other Reaction: OTHER REACTION   Valsartan Itching and Other (See Comments)   Atorvastatin Diarrhea   Carvedilol Other (See Comments)    "teeth hurt when I took it"    Ezetimibe Nausea Only   Fluvastatin Sodium Other (See Comments)    "Can't remember"   Magnesium Hydroxide Other (See Comments)    REACTION: triggers HAs   Meloxicam Other (See Comments)    Pt seeing auro's / spots.     Pneumovax [Pneumococcal Polysaccharide Vaccine] Other (See Comments)    Local reaction   Quinapril Hcl Other (See Comments)     "feelings of tiredness"   Simvastatin Diarrhea   Topamax [Topiramate] Itching   Vit D-Vit E-Safflower Oil Other (See Comments)    Headaches    Past Medical History, Surgical history, Social history, and Family  History were reviewed and updated.  Review of Systems: Review of Systems  Constitutional:  Positive for malaise/fatigue and weight loss.  HENT: Negative.    Eyes: Negative.   Respiratory: Negative.    Cardiovascular: Negative.   Gastrointestinal: Negative.   Genitourinary: Negative.   Musculoskeletal:  Positive for back pain and joint pain.  Skin: Negative.   Neurological: Negative.   Endo/Heme/Allergies: Negative.   Psychiatric/Behavioral: Negative.      Physical Exam:  weight is 109 lb (49.4 kg). Her oral temperature is 97.8 F (36.6 C). Her blood pressure is 140/95 (abnormal) and her pulse is 63. Her respiration  is 18 and oxygen saturation is 100%.   Wt Readings from Last 3 Encounters:  02/15/21 109 lb (49.4 kg)  02/13/21 109 lb 2 oz (49.5 kg)  01/24/21 110 lb (49.9 kg)    Physical Exam Vitals reviewed.  HENT:     Head: Normocephalic and atraumatic.  Eyes:     Pupils: Pupils are equal, round, and reactive to light.  Cardiovascular:     Rate and Rhythm: Normal rate and regular rhythm.     Heart sounds: Normal heart sounds.  Pulmonary:     Effort: Pulmonary effort is normal.     Breath sounds: Normal breath sounds.  Abdominal:     General: Bowel sounds are normal.     Palpations: Abdomen is soft.  Musculoskeletal:        General: No tenderness or deformity. Normal range of motion.     Cervical back: Normal range of motion.  Lymphadenopathy:     Cervical: No cervical adenopathy.  Skin:    General: Skin is warm and dry.     Findings: No erythema or rash.  Neurological:     Mental Status: She is alert and oriented to person, place, and time.  Psychiatric:        Behavior: Behavior normal.        Thought Content: Thought content normal.        Judgment: Judgment normal.      Lab Results  Component Value Date   WBC 3.2 (L) 02/15/2021   HGB 12.9 02/15/2021   HCT 38.3 02/15/2021   MCV 117.8 (H) 02/15/2021   PLT 176 02/15/2021   Lab Results  Component  Value Date   FERRITIN 355 (H) 12/01/2020   IRON 95 12/01/2020   TIBC 279 12/01/2020   UIBC 184 12/01/2020   IRONPCTSAT 34 (H) 12/01/2020   Lab Results  Component Value Date   RETICCTPCT 0.8 02/02/2015   RBC 3.25 (L) 02/15/2021   RETICCTABS 41.4 02/02/2015   No results found for: KPAFRELGTCHN, LAMBDASER, KAPLAMBRATIO No results found for: IGGSERUM, IGA, IGMSERUM No results found for: Kathrynn Ducking, MSPIKE, SPEI   Chemistry      Component Value Date/Time   NA 138 02/15/2021 1316   NA 142 03/25/2017 1457   NA 139 04/10/2016 0855   K 4.2 02/15/2021 1316   K 4.1 03/25/2017 1457   K 4.2 04/10/2016 0855   CL 98 02/15/2021 1316   CL 96 (L) 03/25/2017 1457   CO2 34 (H) 02/15/2021 1316   CO2 32 03/25/2017 1457   CO2 29 04/10/2016 0855   BUN 16 02/15/2021 1316   BUN 7 03/25/2017 1457   BUN 10.0 04/10/2016 0855   CREATININE 0.86 02/15/2021 1316   CREATININE 0.55 (L) 04/22/2020 1444   CREATININE 0.8 04/10/2016 0855      Component Value Date/Time   CALCIUM 10.3 02/15/2021 1316   CALCIUM 9.8 03/25/2017 1457   CALCIUM 9.5 04/10/2016 0855   ALKPHOS 57 02/15/2021 1316   ALKPHOS 86 (H) 03/25/2017 1457   ALKPHOS 85 04/10/2016 0855   AST 27 02/15/2021 1316   AST 19 04/10/2016 0855   ALT 27 02/15/2021 1316   ALT 15 03/25/2017 1457   ALT 13 04/10/2016 0855   BILITOT 1.4 (H) 02/15/2021 1316   BILITOT 1.26 (H) 04/10/2016 0855       Impression and Plan: Ms. Pharo is a very pleasant 87 yo caucasian female with polycythemia and history of stage I infiltrating ductal carcinoma  of the left breast with lumpectomy in 2013 as well as polycythemia, JAK2 positive.   We will go ahead and watch for the blood clot.  We will have her get a CT angiogram in December.  A measure along and she needs to be on blood thinner.  She started in July.  I think she is  Need a least 6 months.  We will not have to phlebotomize her right now.  She also has a  breast cancer.  I will think this is going be a real problem for her.  I just hate that her quality of life is not that great right now.  We will plan to get her back in December.  We will do a CT angiogram the same day that we see her.   Volanda Napoleon, MD 10/5/20222:32 PM

## 2021-02-15 NOTE — Telephone Encounter (Signed)
Per 02/15/21 los - gave upcoming appointments - confirmed - print calendar

## 2021-02-16 LAB — IRON AND TIBC
Iron: 112 ug/dL (ref 41–142)
Saturation Ratios: 43 % (ref 21–57)
TIBC: 260 ug/dL (ref 236–444)
UIBC: 148 ug/dL (ref 120–384)

## 2021-02-16 LAB — FERRITIN: Ferritin: 442 ng/mL — ABNORMAL HIGH (ref 11–307)

## 2021-02-17 ENCOUNTER — Other Ambulatory Visit: Payer: Self-pay | Admitting: Hematology & Oncology

## 2021-02-17 ENCOUNTER — Other Ambulatory Visit (HOSPITAL_BASED_OUTPATIENT_CLINIC_OR_DEPARTMENT_OTHER): Payer: Self-pay

## 2021-02-17 DIAGNOSIS — R21 Rash and other nonspecific skin eruption: Secondary | ICD-10-CM

## 2021-02-17 MED ORDER — VALACYCLOVIR HCL 500 MG PO TABS
ORAL_TABLET | ORAL | 1 refills | Status: DC
  Filled 2021-02-17: qty 30, fill #0
  Filled 2021-02-18: qty 30, 8d supply, fill #0
  Filled 2021-06-26: qty 30, 8d supply, fill #1

## 2021-02-18 ENCOUNTER — Encounter: Payer: Self-pay | Admitting: Hematology & Oncology

## 2021-02-18 ENCOUNTER — Other Ambulatory Visit (HOSPITAL_COMMUNITY): Payer: Self-pay

## 2021-02-20 LAB — DRUG TOX MONITOR 1 W/CONF, ORAL FLD
Alprazolam: NEGATIVE ng/mL (ref <0.50)
Amphetamines: NEGATIVE ng/mL (ref <10)
Barbiturates: NEGATIVE ng/mL (ref <10)
Benzodiazepines: POSITIVE ng/mL — AB (ref <0.50)
Buprenorphine: NEGATIVE ng/mL (ref <0.10)
Chlordiazepoxide: NEGATIVE ng/mL (ref <0.50)
Clonazepam: NEGATIVE ng/mL (ref <0.50)
Cocaine: NEGATIVE ng/mL (ref <5.0)
Cotinine: 58.9 ng/mL — ABNORMAL HIGH (ref <5.0)
Diazepam: NEGATIVE ng/mL (ref <0.50)
EDDP: NEGATIVE ng/mL (ref <5.0)
Fentanyl: NEGATIVE ng/mL (ref <0.10)
Flunitrazepam: NEGATIVE ng/mL (ref <0.50)
Flurazepam: NEGATIVE ng/mL (ref <0.50)
Heroin Metabolite: NEGATIVE ng/mL (ref <1.0)
Lorazepam: 1.01 ng/mL — ABNORMAL HIGH (ref <0.50)
MARIJUANA: NEGATIVE ng/mL (ref <2.5)
MDMA: NEGATIVE ng/mL (ref <10)
Meprobamate: NEGATIVE ng/mL (ref <2.5)
Methadone: NEGATIVE ng/mL (ref <5.0)
Methadone: NEGATIVE ng/mL (ref <5.0)
Midazolam: NEGATIVE ng/mL (ref <0.50)
Nicotine Metabolite: POSITIVE ng/mL — AB (ref <5.0)
Nordiazepam: NEGATIVE ng/mL (ref <0.50)
Opiates: NEGATIVE ng/mL (ref <2.5)
Oxazepam: NEGATIVE ng/mL (ref <0.50)
Phencyclidine: NEGATIVE ng/mL (ref <10)
Tapentadol: NEGATIVE ng/mL (ref <5.0)
Temazepam: NEGATIVE ng/mL (ref <0.50)
Triazolam: NEGATIVE ng/mL (ref <0.50)
Zolpidem: NEGATIVE ng/mL (ref <5.0)

## 2021-03-08 NOTE — Progress Notes (Signed)
Cardiology Office Note:    Date:  03/09/2021   ID:  [name], DOB 1930/05/05, MRN [id number]  PCP:  Colon Branch, MD  Cardiologist:  Shirlee More, MD    Referring MD: Colon Branch, MD    ASSESSMENT:    1. Chronic diastolic CHF (congestive heart failure) ([place])   2. Mild CAD   3. Hypertensive heart disease with chronic diastolic congestive heart failure ([place])   4. Mixed hyperlipidemia    PLAN:    In order of problems listed above:  She is symptomatic stop taking her diuretic blood pressure uncontrolled and we negotiated taking a small dose of loop diuretic every other day 1 week we will have her come back to the office check renal function we will also recheck her blood pressure I am hesitant to start a diuretic and give her a new antihypertensive agent at the same time with her frailty and falls. She has a history of CAD is not having any typical angina Poorly controlled hypertension started diuretic She is no longer taking a statin   Next appointment: 6 months   Medication Adjustments/Labs and Tests Ordered: Current medicines are reviewed at length with the patient today.  Concerns regarding medicines are outlined above.  No orders of the defined types were placed in this encounter.  No orders of the defined types were placed in this encounter.   Chief Complaint  Patient presents with   Follow-up   Congestive Heart Failure     History of Present Illness:    [name] is a 87 y.o. female with a hx of Takotsubo cardiomyopathy 1990 with normal coronary arteriography, repeat coronary angiography in 2003 with mild nonobstructive CAD, cardiomyopathy initial ejection fraction 40% subsequently recovered in January 2020 60% history of stroke venous thromboembolism and Raynaud's phenomenon last seen 08/11/2018 with compensated chronic diastolic heart failure.  She has polycythemia vera iron deficiency anemia and recent pulmonary embolism  Compliance with diet,  lifestyle and medications: No she is stopped taking a diuretic because of urinary frequency  She is now anticoagulated living independently struggling pain in her hip and she has had frequent falls She has a great deal of heartburn indigestion was taking over-the-counter PPI and I will give her prescription for Protonix At times she is the word to describe chest pain at nighttime we will check an EKG before leaving my office today. She does not check her blood pressure at home. She is lost in the range of 45 pounds in the last year she tells me she is short of breath ambulating in the home and she has episodes of orthopnea. She is not having typical exertional angina. Past Medical History:  Diagnosis Date   CAD (coronary artery disease)    mild nonobstructive disease on cath in 2003   Cancer [facility])    Cardiomyopathy    Probable Takotsubo, severe CP w/ normal cath in 1994. Severe CP in 2003 w/ widespread T wave inversions on ECG. Cath w/ minimal coronary disease but LV-gram showed periapical severe hypokinesis and basilar hyperkinesia (EF 40%). Last echo in 4/09 confirmed full LV functional recovery with EF 60%, no regional wall motion abnormalities, mild to moderate LVH.   Chronic diastolic CHF (congestive heart failure) ([place]) 09/13/2019   CVA (cerebral infarction)    Depression with anxiety 03/29/2011   lost husband 3'09   DVT (deep venous thrombosis) ([place])    after venous ablation, R leg   Fracture 09/10/2007   L2, status  post vertebroplasty of L2 performed by IR   GERD (gastroesophageal reflux disease)    Hemorrhoids    Hiatal hernia 03/29/2011   no nerve problems   Hyperlipidemia    Hyperplastic colon polyp 06/2001   Hypertension    Iron deficiency anemia due to chronic blood loss 12/30/2017   OA (osteoarthritis) of knee 03/29/2011   w/ bilateral knee pain-not a problem now   OSA (obstructive sleep apnea) 03/29/2011   no cpap used, not a problem now.   Osteoporosis    Otalgia  of both ears    Dr. Simeon Craft   Polycythemia    Stroke [facility]) 03/29/2011   CVA x2 -last 10'12-?TIA(visual problems)   Varicose vein     Past Surgical History:  Procedure Laterality Date   APPENDECTOMY     BACK SURGERY     BREAST BIOPSY     BREAST CYST EXCISION     BREAST LUMPECTOMY Left 2013   left stage I left breast cancer   CHOLECYSTECTOMY  04/09/2011   Procedure: LAPAROSCOPIC CHOLECYSTECTOMY WITH INTRAOPERATIVE CHOLANGIOGRAM;  Surgeon: Dawn Hollingshead, MD;  Location: [facility];  Service: General;  Laterality: N/A;   Dental Extraction     L maxillary molar   INTRAMEDULLARY (IM) NAIL INTERTROCHANTERIC Left 12/25/2019   Procedure: INTRAMEDULLARY (IM) NAIL INTERTROCHANTRIC;  Surgeon: Paralee Cancel, MD;  Location: [facility];  Service: Orthopedics;  Laterality: Left;   IR KYPHO THORACIC WITH BONE BIOPSY  09/15/2019   IR KYPHO THORACIC WITH BONE BIOPSY  07/13/2020       IR KYPHO THORACIC WITH BONE BIOPSY  07/13/2020   IR VERTEBROPLASTY CERV/THOR BX INC UNI/BIL INC/INJECT/IMAGING  11/10/2019   KYPHOPLASTY N/A 11/16/2020   Procedure: T11,  L1,KYPHOPLASTY;  Surgeon: Dawn Schools, MD;  Location: [facility];  Service: Orthopedics;  Laterality: N/A;   KYPHOSIS SURGERY  08/2007   cement used   OVARIAN CYST SURGERY     left   RADIOLOGY WITH ANESTHESIA N/A 11/10/2019   Procedure: VERTEBROPLASTY;  Surgeon: Dawn Bras, MD;  Location: [facility];  Service: Radiology;  Laterality: N/A;   TONSILLECTOMY     TOTAL KNEE ARTHROPLASTY  06/2010   left   TUBAL LIGATION      Current Medications: Current Meds  Medication Sig   acetaminophen (TYLENOL) 500 MG tablet Take 500 mg by mouth every 6 (six) hours as needed.   alendronate (FOSAMAX) 70 MG tablet Take 1 tablet (70 mg total) by mouth every 7 (seven) days. Take with a full glass of water on an empty stomach.   betamethasone dipropionate 0.05 % cream Apply topically 2 (two) times daily.   HYDROcodone-acetaminophen (NORCO/VICODIN) 5-325 MG tablet Take 1 tablet by  mouth 2 (two) times daily.   lactose free nutrition (BOOST) LIQD Take 237 mLs by mouth daily.   LORazepam (ATIVAN) 0.5 MG tablet Take 1 tablet (0.5 mg total) by mouth 2 (two) times daily as needed for anxiety.   metoprolol tartrate (LOPRESSOR) 50 MG tablet Take 1 tablet (50 mg total) by mouth 2 (two) times daily.   nitroGLYCERIN (NITROSTAT) 0.4 MG SL tablet Place 1 tablet (0.4 mg total) under the tongue every 5 (five) minutes x 3 doses as needed for chest pain.   omeprazole (PRILOSEC) 20 MG capsule Take 1 capsule (20 mg total) by mouth daily.   Rivaroxaban (XARELTO) 15 MG TABS tablet Take 1 tablet (15 mg total) by mouth daily.   valACYclovir (VALTREX) 500 MG tablet TAKE 2 TABLETS BY MOUTH AT ONSET OF RASH  AND 2 TABLETS 12 HOURS LATER AS NEEDED     Allergies:   Penicillins, Valsartan, Atorvastatin, Carvedilol, Ezetimibe, Fluvastatin sodium, Magnesium hydroxide, Meloxicam, Pneumovax [pneumococcal polysaccharide vaccine], Quinapril hcl, Simvastatin, Topamax [topiramate], and Vit d-vit e-safflower oil   Social History   Socioeconomic History   Marital status: Widowed    Spouse name: Not on file   Number of children: 3   Years of education: Not on file   Highest education level: Not on file  Occupational History   Occupation: n/a  Tobacco Use   Smoking status: Former    Packs/day: 1.00    Years: 37.00    Pack years: 37.00    Types: Cigarettes    Start date: 06/26/1951    Quit date: 05/14/1988    Years since quitting: 32.8   Smokeless tobacco: Never   Tobacco comments:    quit 25 years ago  Vaping Use   Vaping Use: Never used  Substance and Sexual Activity   Alcohol use: Not Currently    Alcohol/week: 1.0 standard drink    Types: 1 Glasses of wine per week    Comment: occasional/social   Drug use: No   Sexual activity: Not Currently  Other Topics Concern   Not on file  Social History Narrative   Lost husband 07/29/07-    Lives in [place]   Lives in a town house by herself, has  3 daughters, lost Mickel Baas (03-2017)   [place] lives in [place] ( [place]     Not driving as of [phone]            Social Determinants of Health   Financial Resource Strain: Not on file  Food Insecurity: Not on file  Transportation Needs: Not on file  Physical Activity: Not on file  Stress: Not on file  Social Connections: Not on file     Family History: The patient's family history includes Breast cancer in her mother and sister; Colon cancer in an other family member; Heart disease in her father; Leukemia in her brother. ROS:   Please see the history of present illness.    All other systems reviewed and are negative.  EKGs/Labs/Other Studies Reviewed:    The following studies were reviewed today:  EKG:  EKG ordered today and personally reviewed.  The ekg ordered today demonstrates sinus rhythm 57 bpm and remains normal  Recent Labs: 04/22/2020: TSH 2.61 02/15/2021: ALT 27; BUN 16; Creatinine 0.86; Hemoglobin 12.9; Platelet Count 176; Potassium 4.2; Sodium 138  Recent Lipid Panel    Component Value Date/Time   CHOL 212 (H) 03/19/2014 1141   TRIG 110.0 03/19/2014 1141   HDL 47.20 03/19/2014 1141   CHOLHDL 4 03/19/2014 1141   VLDL 22.0 03/19/2014 1141   LDLCALC 143 (H) 03/19/2014 1141   LDLDIRECT 142.3 06/25/2011 0954    Physical Exam:    VS:  BP (!) 170/90   Pulse 60   Ht 5\' 6"  (1.676 m)   Wt 109 lb (49.4 kg)   SpO2 99%   BMI 17.59 kg/m     Wt Readings from Last 3 Encounters:  03/09/21 109 lb (49.4 kg)  02/15/21 109 lb (49.4 kg)  02/13/21 109 lb 2 oz (49.5 kg)     GEN: She looks very frail and weak well nourished, well developed in no acute distress HEENT: Normal NECK: Mild JVD; No carotid bruits LYMPHATICS: No lymphadenopathy CARDIAC: RRR, no murmurs, rubs, gallops RESPIRATORY:  Clear to auscultation without rales, wheezing or rhonchi  ABDOMEN: Soft,  non-tender, non-distended MUSCULOSKELETAL: Trace edema; No deformity  SKIN: Warm and dry NEUROLOGIC:   Alert and oriented x 3 PSYCHIATRIC:  Normal affect    Signed, Shirlee More, MD  03/09/2021 1:15 PM    [facility]

## 2021-03-09 ENCOUNTER — Encounter: Payer: Self-pay | Admitting: Cardiology

## 2021-03-09 ENCOUNTER — Ambulatory Visit (INDEPENDENT_AMBULATORY_CARE_PROVIDER_SITE_OTHER): Payer: Medicare Other | Admitting: Cardiology

## 2021-03-09 ENCOUNTER — Other Ambulatory Visit: Payer: Self-pay

## 2021-03-09 VITALS — BP 170/90 | HR 60 | Ht 66.0 in | Wt 109.0 lb

## 2021-03-09 DIAGNOSIS — I5032 Chronic diastolic (congestive) heart failure: Secondary | ICD-10-CM

## 2021-03-09 DIAGNOSIS — E782 Mixed hyperlipidemia: Secondary | ICD-10-CM | POA: Diagnosis not present

## 2021-03-09 DIAGNOSIS — I2510 Atherosclerotic heart disease of native coronary artery without angina pectoris: Secondary | ICD-10-CM | POA: Diagnosis not present

## 2021-03-09 DIAGNOSIS — I110 Hypertensive heart disease with heart failure: Secondary | ICD-10-CM

## 2021-03-09 MED ORDER — FUROSEMIDE 20 MG PO TABS: 20.0000 mg | ORAL_TABLET | ORAL | 3 refills | Status: DC

## 2021-03-09 MED ORDER — PANTOPRAZOLE SODIUM 40 MG PO TBEC: 40.0000 mg | DELAYED_RELEASE_TABLET | Freq: Every day | ORAL | 3 refills | Status: DC

## 2021-03-09 NOTE — Patient Instructions (Signed)
Medication Instructions:  Your physician has recommended you make the following change in your medication:  RESTART: Furosemide 20 mg take one tablet by mouth every other day.  START: Protonix 40 mg take one tablet by mouth daily.  *If you need a refill on your cardiac medications before your next appointment, please call your pharmacy*   Lab Work: Your physician recommends that you return for lab work in: 2 weeks BMP, ProBNP If you have labs (blood work) drawn today and your tests are completely normal, you will receive your results only by: [place] (if you have MyChart) OR A paper copy in the mail If you have any lab test that is abnormal or we need to change your treatment, we will call you to review the results.   Testing/Procedures: None   Follow-Up: At [facility], you and your health needs are our priority.  As part of our continuing mission to provide you with exceptional heart care, we have created designated Provider Care Teams.  These Care Teams include your primary Cardiologist (physician) and Advanced Practice Providers (APPs -  Physician Assistants and Nurse Practitioners) who all work together to provide you with the care you need, when you need it.  We recommend signing up for the patient portal called "MyChart".  Sign up information is provided on this After Visit Summary.  MyChart is used to connect with patients for Virtual Visits (Telemedicine).  Patients are able to view lab/test results, encounter notes, upcoming appointments, etc.  Non-urgent messages can be sent to your provider as well.   To learn more about what you can do with MyChart, go to [email or web address].    Your next appointment:   6 month(s)  The format for your next appointment:   In Person  Provider:   Shirlee More, MD   Other Instructions '

## 2021-03-14 ENCOUNTER — Telehealth: Payer: Self-pay | Admitting: Internal Medicine

## 2021-03-14 NOTE — Telephone Encounter (Signed)
Requesting: lorazepam Contract:   UDS:02/13/21 Last Visit: 02/13/21 Next Visit: 05/22/21 Last Refill: 02/10/21  Please Advise

## 2021-03-15 NOTE — Telephone Encounter (Signed)
PDMP okay, Rx sent 

## 2021-03-21 ENCOUNTER — Other Ambulatory Visit (HOSPITAL_COMMUNITY): Payer: Self-pay

## 2021-03-21 ENCOUNTER — Encounter: Payer: Self-pay | Admitting: Hematology & Oncology

## 2021-03-21 ENCOUNTER — Other Ambulatory Visit (HOSPITAL_BASED_OUTPATIENT_CLINIC_OR_DEPARTMENT_OTHER): Payer: Self-pay

## 2021-03-22 ENCOUNTER — Other Ambulatory Visit (HOSPITAL_BASED_OUTPATIENT_CLINIC_OR_DEPARTMENT_OTHER): Payer: Self-pay

## 2021-03-22 ENCOUNTER — Encounter: Payer: Self-pay | Admitting: Hematology & Oncology

## 2021-03-22 ENCOUNTER — Other Ambulatory Visit: Payer: Self-pay

## 2021-03-22 DIAGNOSIS — I5032 Chronic diastolic (congestive) heart failure: Secondary | ICD-10-CM

## 2021-03-22 DIAGNOSIS — I2510 Atherosclerotic heart disease of native coronary artery without angina pectoris: Secondary | ICD-10-CM | POA: Diagnosis not present

## 2021-03-22 DIAGNOSIS — I110 Hypertensive heart disease with heart failure: Secondary | ICD-10-CM | POA: Diagnosis not present

## 2021-03-22 DIAGNOSIS — E782 Mixed hyperlipidemia: Secondary | ICD-10-CM

## 2021-03-23 ENCOUNTER — Other Ambulatory Visit (HOSPITAL_BASED_OUTPATIENT_CLINIC_OR_DEPARTMENT_OTHER): Payer: Self-pay

## 2021-03-23 ENCOUNTER — Telehealth: Payer: Self-pay

## 2021-03-23 LAB — BASIC METABOLIC PANEL
BUN/Creatinine Ratio: 11 — ABNORMAL LOW (ref 12–28)
BUN: 9 mg/dL (ref 8–27)
CO2: 26 mmol/L (ref 20–29)
Calcium: 9.3 mg/dL (ref 8.7–10.3)
Chloride: 101 mmol/L (ref 96–106)
Creatinine, Ser: 0.84 mg/dL (ref 0.57–1.00)
Glucose: 96 mg/dL (ref 70–99)
Potassium: 4.4 mmol/L (ref 3.5–5.2)
Sodium: 139 mmol/L (ref 134–144)
eGFR: 66 mL/min/{1.73_m2} (ref >59)

## 2021-03-23 LAB — PRO B NATRIURETIC PEPTIDE: NT-Pro BNP: 704 pg/mL (ref 0–738)

## 2021-03-23 NOTE — Telephone Encounter (Signed)
Left message on patients voicemail to please return our call.   

## 2021-03-23 NOTE — Telephone Encounter (Signed)
-----   Message from Richardo Priest, MD sent at 03/23/2021  9:28 AM EST ----- Normal or stable result  I would like her to continue her low-dose diuretic

## 2021-03-27 ENCOUNTER — Telehealth: Payer: Self-pay

## 2021-03-27 NOTE — Telephone Encounter (Signed)
-----   Message from Richardo Priest, MD sent at 03/23/2021  9:28 AM EST ----- Normal or stable result  I would like her to continue her low-dose diuretic

## 2021-03-27 NOTE — Telephone Encounter (Signed)
Tried calling patient. No answer and no voicemail set up for me to leave a message.  Letter mailed to the patient at this time.

## 2021-04-13 ENCOUNTER — Telehealth: Payer: Self-pay | Admitting: Internal Medicine

## 2021-04-13 MED ORDER — HYDROCODONE-ACETAMINOPHEN 5-325 MG PO TABS: 1.0000 | ORAL_TABLET | Freq: Two times a day (BID) | ORAL | 0 refills | Status: DC

## 2021-04-13 NOTE — Telephone Encounter (Signed)
PDMP okay, Rx sent 

## 2021-04-13 NOTE — Telephone Encounter (Signed)
Medication:  HYDROcodone-acetaminophen (NORCO/VICODIN) 5-325 MG tablet [[id number]]     Has the patient contacted their pharmacy? No. (If no, request that the patient contact the pharmacy for the refill.) (If yes, when and what did the pharmacy advise?)     Preferred Pharmacy (with phone number or street name):  [id number] [address], [address]  [place], [place] [address]  Phone:  [phone]  Fax:  [phone]     Agent: Please be advised that RX refills may take up to 3 business days. We ask that you follow-up with your pharmacy.

## 2021-04-13 NOTE — Telephone Encounter (Signed)
Requesting: hydrocodone 5-325mg  Contract: 04/22/2020 UDS: 02/13/2021 Last Visit: 02/13/2021 Next Visit: 05/22/2021 Last Refill: 02/13/2021 #60 and 0RF  Please Advise

## 2021-04-27 ENCOUNTER — Ambulatory Visit (HOSPITAL_BASED_OUTPATIENT_CLINIC_OR_DEPARTMENT_OTHER): Admission: RE | Admit: 2021-04-27 | Payer: Medicare Other | Source: Ambulatory Visit

## 2021-04-27 ENCOUNTER — Inpatient Hospital Stay: Payer: Medicare Other | Admitting: Hematology & Oncology

## 2021-04-27 ENCOUNTER — Telehealth (HOSPITAL_BASED_OUTPATIENT_CLINIC_OR_DEPARTMENT_OTHER): Payer: Self-pay

## 2021-04-27 ENCOUNTER — Inpatient Hospital Stay: Payer: Medicare Other | Attending: Hematology & Oncology

## 2021-04-27 DIAGNOSIS — D45 Polycythemia vera: Secondary | ICD-10-CM | POA: Insufficient documentation

## 2021-04-27 DIAGNOSIS — I2699 Other pulmonary embolism without acute cor pulmonale: Secondary | ICD-10-CM | POA: Insufficient documentation

## 2021-04-27 DIAGNOSIS — D509 Iron deficiency anemia, unspecified: Secondary | ICD-10-CM | POA: Insufficient documentation

## 2021-04-27 DIAGNOSIS — Z7901 Long term (current) use of anticoagulants: Secondary | ICD-10-CM | POA: Insufficient documentation

## 2021-04-27 DIAGNOSIS — Z853 Personal history of malignant neoplasm of breast: Secondary | ICD-10-CM | POA: Insufficient documentation

## 2021-05-01 DIAGNOSIS — H43813 Vitreous degeneration, bilateral: Secondary | ICD-10-CM | POA: Diagnosis not present

## 2021-05-01 DIAGNOSIS — H353131 Nonexudative age-related macular degeneration, bilateral, early dry stage: Secondary | ICD-10-CM | POA: Diagnosis not present

## 2021-05-01 DIAGNOSIS — H25013 Cortical age-related cataract, bilateral: Secondary | ICD-10-CM | POA: Diagnosis not present

## 2021-05-01 DIAGNOSIS — H35363 Drusen (degenerative) of macula, bilateral: Secondary | ICD-10-CM | POA: Diagnosis not present

## 2021-05-01 DIAGNOSIS — H524 Presbyopia: Secondary | ICD-10-CM | POA: Diagnosis not present

## 2021-05-01 DIAGNOSIS — H52203 Unspecified astigmatism, bilateral: Secondary | ICD-10-CM | POA: Diagnosis not present

## 2021-05-01 DIAGNOSIS — H5203 Hypermetropia, bilateral: Secondary | ICD-10-CM | POA: Diagnosis not present

## 2021-05-01 DIAGNOSIS — H04123 Dry eye syndrome of bilateral lacrimal glands: Secondary | ICD-10-CM | POA: Diagnosis not present

## 2021-05-01 DIAGNOSIS — H2513 Age-related nuclear cataract, bilateral: Secondary | ICD-10-CM | POA: Diagnosis not present

## 2021-05-02 ENCOUNTER — Encounter (HOSPITAL_BASED_OUTPATIENT_CLINIC_OR_DEPARTMENT_OTHER): Payer: Self-pay

## 2021-05-02 ENCOUNTER — Other Ambulatory Visit (HOSPITAL_BASED_OUTPATIENT_CLINIC_OR_DEPARTMENT_OTHER): Payer: Self-pay

## 2021-05-02 ENCOUNTER — Emergency Department (HOSPITAL_BASED_OUTPATIENT_CLINIC_OR_DEPARTMENT_OTHER): Payer: Medicare Other

## 2021-05-02 ENCOUNTER — Other Ambulatory Visit: Payer: Self-pay | Admitting: Family

## 2021-05-02 ENCOUNTER — Other Ambulatory Visit: Payer: Self-pay

## 2021-05-02 ENCOUNTER — Emergency Department (HOSPITAL_BASED_OUTPATIENT_CLINIC_OR_DEPARTMENT_OTHER)
Admission: EM | Admit: 2021-05-02 | Discharge: 2021-05-02 | Disposition: A | Discharge status: Home / Self Care | Payer: Medicare Other | Attending: Emergency Medicine | Admitting: Emergency Medicine

## 2021-05-02 DIAGNOSIS — I110 Hypertensive heart disease with heart failure: Secondary | ICD-10-CM | POA: Insufficient documentation

## 2021-05-02 DIAGNOSIS — M25512 Pain in left shoulder: Secondary | ICD-10-CM | POA: Diagnosis not present

## 2021-05-02 DIAGNOSIS — Z96652 Presence of left artificial knee joint: Secondary | ICD-10-CM | POA: Insufficient documentation

## 2021-05-02 DIAGNOSIS — I639 Cerebral infarction, unspecified: Secondary | ICD-10-CM | POA: Diagnosis not present

## 2021-05-02 DIAGNOSIS — W1839XA Other fall on same level, initial encounter: Secondary | ICD-10-CM | POA: Diagnosis not present

## 2021-05-02 DIAGNOSIS — I2510 Atherosclerotic heart disease of native coronary artery without angina pectoris: Secondary | ICD-10-CM | POA: Diagnosis not present

## 2021-05-02 DIAGNOSIS — S0990XA Unspecified injury of head, initial encounter: Secondary | ICD-10-CM | POA: Diagnosis not present

## 2021-05-02 DIAGNOSIS — Y9301 Activity, walking, marching and hiking: Secondary | ICD-10-CM | POA: Diagnosis not present

## 2021-05-02 DIAGNOSIS — W19XXXA Unspecified fall, initial encounter: Secondary | ICD-10-CM

## 2021-05-02 DIAGNOSIS — Z853 Personal history of malignant neoplasm of breast: Secondary | ICD-10-CM | POA: Diagnosis not present

## 2021-05-02 DIAGNOSIS — Y92481 Parking lot as the place of occurrence of the external cause: Secondary | ICD-10-CM | POA: Diagnosis not present

## 2021-05-02 DIAGNOSIS — M25522 Pain in left elbow: Secondary | ICD-10-CM | POA: Diagnosis not present

## 2021-05-02 DIAGNOSIS — Z7901 Long term (current) use of anticoagulants: Secondary | ICD-10-CM | POA: Diagnosis not present

## 2021-05-02 DIAGNOSIS — G9389 Other specified disorders of brain: Secondary | ICD-10-CM | POA: Diagnosis not present

## 2021-05-02 DIAGNOSIS — I5032 Chronic diastolic (congestive) heart failure: Secondary | ICD-10-CM | POA: Diagnosis not present

## 2021-05-02 DIAGNOSIS — S50312A Abrasion of left elbow, initial encounter: Secondary | ICD-10-CM | POA: Diagnosis not present

## 2021-05-02 DIAGNOSIS — M79642 Pain in left hand: Secondary | ICD-10-CM | POA: Diagnosis not present

## 2021-05-02 DIAGNOSIS — F17210 Nicotine dependence, cigarettes, uncomplicated: Secondary | ICD-10-CM | POA: Diagnosis not present

## 2021-05-02 DIAGNOSIS — Z043 Encounter for examination and observation following other accident: Secondary | ICD-10-CM | POA: Diagnosis not present

## 2021-05-02 DIAGNOSIS — S59902A Unspecified injury of left elbow, initial encounter: Secondary | ICD-10-CM | POA: Diagnosis present

## 2021-05-02 NOTE — ED Notes (Signed)
Patient transported to X-ray 

## 2021-05-02 NOTE — ED Triage Notes (Addendum)
Pt states she fell in the parking lot-pain and skin tear to left elbow-pain to left knee-denies head/neck injury-to triage in w/c-NAD

## 2021-05-02 NOTE — ED Provider Notes (Signed)
Emergency Department Provider Note   I have reviewed the triage vital signs and the nursing notes.   HISTORY  Chief Complaint Fall   HPI [name] is a 87 y.o. female past medical history reviewed below presents to the emergency department for evaluation of fall in the parking lot today.  Patient states he was walking through the parking lot when she lost her footing and fell forward.  She landed mainly on her left elbow and has an abrasion with some burning pain in this location to the left elbow.  Mild pain with moving the elbow.  No shoulder, neck, headache.  No head injury or loss of consciousness.  The patient is anticoagulated on Eliquis.  No pain in the hips.   Past Medical History:  Diagnosis Date   CAD (coronary artery disease)    mild nonobstructive disease on cath in 2003   Cancer [facility])    Cardiomyopathy    Probable Takotsubo, severe CP w/ normal cath in 1994. Severe CP in 2003 w/ widespread T wave inversions on ECG. Cath w/ minimal coronary disease but LV-gram showed periapical severe hypokinesis and basilar hyperkinesia (EF 40%). Last echo in 4/09 confirmed full LV functional recovery with EF 60%, no regional wall motion abnormalities, mild to moderate LVH.   Chronic diastolic CHF (congestive heart failure) ([place]) 09/13/2019   CVA (cerebral infarction)    Depression with anxiety 03/29/2011   lost husband 3'09   DVT (deep venous thrombosis) ([place])    after venous ablation, R leg   Fracture 09/10/2007   L2, status post vertebroplasty of L2 performed by IR   GERD (gastroesophageal reflux disease)    Hemorrhoids    Hiatal hernia 03/29/2011   no nerve problems   Hyperlipidemia    Hyperplastic colon polyp 06/2001   Hypertension    Iron deficiency anemia due to chronic blood loss 12/30/2017   OA (osteoarthritis) of knee 03/29/2011   w/ bilateral knee pain-not a problem now   OSA (obstructive sleep apnea) 03/29/2011   no cpap used, not a problem now.    Osteoporosis    Otalgia of both ears    Dr. Simeon Craft   Polycythemia    Stroke [facility]) 03/29/2011   CVA x2 -last 10'12-?TIA(visual problems)   Varicose vein     Patient Active Problem List   Diagnosis Date Noted   Rib pain on right side 01/24/2021   Skin rash 01/24/2021   Mastalgia 01/24/2021   PE (pulmonary thromboembolism) ([place]) 12/25/2020   Lumbar compression fracture ([place]) 11/16/2020   Generalized weakness 10/03/2020   Hypertension    GERD (gastroesophageal reflux disease)    Dyspnea    DVT (deep venous thrombosis) ([facility])    Diverticulitis    Cancer ([facility])    CAD (coronary artery disease)    Acute midline thoracic back pain 03/04/2020   Malnutrition of moderate degree 12/27/2019   Pressure injury of skin 12/27/2019   S/p left hip fracture    Comminuted fracture of hip, left, open type I or II, initial encounter ([place]) 12/24/2019   Closed left hip fracture ([place]) 12/24/2019   ICH (intracerebral hemorrhage) ([place]) 11/04/2019   Intractable back pain 09/13/2019   Compression fracture of T9 vertebra (H[facility]) 09/13/2019   Chronic diastolic CHF (congestive heart failure) ([place]) 09/13/2019   Overweight (BMI 25.0-29.9) 09/13/2019   Constipation 09/13/2019   Raynaud's phenomenon without gangrene 05/19/2018   Iron deficiency anemia due to chronic blood loss 12/30/2017   Insomnia 06/25/2017   Mild  CAD 05/24/2017   PCP NOTES >>> 02/15/2015   Superficial thrombophlebitis 04/06/2014   Annual physical exam 08/18/2012   Edema 04/14/2012   Breast cancer ([place]) 11/05/2011   Polycythemia 11/05/2011   Stroke ([place]) 03/29/2011   OSA (obstructive sleep apnea) 03/29/2011   OA (osteoarthritis) of knee 03/29/2011   Hiatal hernia 03/29/2011   E. coli gastroenteritis 03/29/2011   Depression with anxiety 03/29/2011   Chest pain 01/16/2011   TIA (transient ischemic attack) 01/16/2011   CARDIOMYOPATHY 08/03/2009   DJD -- pain mgmt 08/16/2008   ABDOMINAL PAIN, LEFT LOWER QUADRANT 07/05/2008   GAIT  DISTURBANCE 10/21/2007   ANXIETY DEPRESSION 10/08/2007   Fracture 09/10/2007   Hyperlipidemia 06/25/2007   Takotsubo cardiomyopathy 06/25/2007   GERD without esophagitis 06/25/2007   Essential hypertension 09/30/2006   Senile osteoporosis 09/30/2006   Migraine with aura 09/30/2006   Hyperplastic colon polyp 06/2001    Past Surgical History:  Procedure Laterality Date   APPENDECTOMY     BACK SURGERY     BREAST BIOPSY     BREAST CYST EXCISION     BREAST LUMPECTOMY Left 2013   left stage I left breast cancer   CHOLECYSTECTOMY  04/09/2011   Procedure: LAPAROSCOPIC CHOLECYSTECTOMY WITH INTRAOPERATIVE CHOLANGIOGRAM;  Surgeon: Odis Hollingshead, MD;  Location: [facility];  Service: General;  Laterality: N/A;   Dental Extraction     L maxillary molar   INTRAMEDULLARY (IM) NAIL INTERTROCHANTERIC Left 12/25/2019   Procedure: INTRAMEDULLARY (IM) NAIL INTERTROCHANTRIC;  Surgeon: Paralee Cancel, MD;  Location: [facility];  Service: Orthopedics;  Laterality: Left;   IR KYPHO THORACIC WITH BONE BIOPSY  09/15/2019   IR KYPHO THORACIC WITH BONE BIOPSY  07/13/2020       IR KYPHO THORACIC WITH BONE BIOPSY  07/13/2020   IR VERTEBROPLASTY CERV/THOR BX INC UNI/BIL INC/INJECT/IMAGING  11/10/2019   KYPHOPLASTY N/A 11/16/2020   Procedure: T11,  L1,KYPHOPLASTY;  Surgeon: Melina Schools, MD;  Location: [place];  Service: Orthopedics;  Laterality: N/A;   KYPHOSIS SURGERY  08/2007   cement used   OVARIAN CYST SURGERY     left   RADIOLOGY WITH ANESTHESIA N/A 11/10/2019   Procedure: VERTEBROPLASTY;  Surgeon: Luanne Bras, MD;  Location: [place];  Service: Radiology;  Laterality: N/A;   TONSILLECTOMY     TOTAL KNEE ARTHROPLASTY  06/2010   left   TUBAL LIGATION      Allergies Penicillins, Valsartan, Atorvastatin, Carvedilol, Ezetimibe, Fluvastatin sodium, Magnesium hydroxide, Meloxicam, Pneumovax [pneumococcal polysaccharide vaccine], Quinapril hcl, Simvastatin, Topamax [topiramate], and Vit d-vit e-safflower oil  Family  History  Problem Relation Age of Onset   Breast cancer Mother    Heart disease Father        MIs   Colon cancer Other        cousin   Breast cancer Sister    Leukemia Brother        GM    Social History Social History   Tobacco Use   Smoking status: Some Days    Packs/day: 1.00    Years: 37.00    Pack years: 37.00    Types: Cigarettes    Start date: 06/26/1949   Smokeless tobacco: Never   Tobacco comments:    quit 25 years ago  Vaping Use   Vaping Use: Never used  Substance Use Topics   Alcohol use: Not Currently    Alcohol/week: 1.0 standard drink    Types: 1 Glasses of wine per week   Drug use: No  Review of Systems  Constitutional: No fever/chills Eyes: No visual changes. ENT: No sore throat. Cardiovascular: Denies chest pain. Respiratory: Denies shortness of breath. Gastrointestinal: No abdominal pain.  No nausea, no vomiting.  No diarrhea.  No constipation. Genitourinary: Negative for dysuria. Musculoskeletal: Negative for back pain. Skin: Skin tear to the left lateral elbow.  Neurological: Negative for headaches, focal weakness or numbness.  10-point ROS otherwise negative.  ____________________________________________   PHYSICAL EXAM:  VITAL SIGNS: ED Triage Vitals  Enc Vitals Group     BP 05/02/21 1259 (!) 185/91     Pulse Rate 05/02/21 1259 66     Resp 05/02/21 1259 20     Temp 05/02/21 1259 98 F (36.7 C)     Temp Source 05/02/21 1259 Oral     SpO2 05/02/21 1259 92 %   Constitutional: Alert and oriented. Well appearing and in no acute distress. Eyes: Conjunctivae are normal.  Head: Atraumatic. Nose: No congestion/rhinnorhea. Mouth/Throat: Mucous membranes are moist.   Neck: No stridor.  No cervical spine tenderness to palpation. Cardiovascular: Normal rate, regular rhythm. Good peripheral circulation. Grossly normal heart sounds.   Respiratory: Normal respiratory effort.  No retractions. Lungs CTAB. Gastrointestinal: Soft and  nontender. No distention.  Musculoskeletal: No lower extremity tenderness nor edema. No gross deformities of extremities.  Normal range of motion of all extremities including the bilateral upper and lower extremities.  Normal range of motion of the left elbow and wrist.  No scaphoid tenderness bilaterally.  Neurologic:  Normal speech and language. No gross focal neurologic deficits are appreciated.  Skin:  Skin is warm, dry and intact. No rash noted.  ____________________________________________  RADIOLOGY  DG Pelvis 1-2 Views  Result Date: 05/02/2021 CLINICAL DATA:  Fall. EXAM: PELVIS - 1-2 VIEW COMPARISON:  CT abdomen pelvis dated December 25, 2020. FINDINGS: No acute fracture or dislocation. Old healed left intertrochanteric femur fracture status post ORIF. Old healed bilateral inferior pubic rami fractures. The hip joint spaces are relatively preserved. Osteopenia. Unchanged small calcified fibroid in the pelvis. IMPRESSION: 1. No acute osseous abnormality. Electronically Signed   By: Titus Dubin M.D.   On: 05/02/2021 15:05   DG Elbow Complete Left  Result Date: 05/02/2021 CLINICAL DATA:  Elbow pain after fall. EXAM: LEFT ELBOW - COMPLETE 3+ VIEW COMPARISON:  None. FINDINGS: There is no evidence of fracture, dislocation, or joint effusion. There is no evidence of arthropathy or other focal bone abnormality. Soft tissues are unremarkable. IMPRESSION: Negative. Electronically Signed   By: Titus Dubin M.D.   On: 05/02/2021 15:01   CT HEAD WO CONTRAST (5MM)  Result Date: 05/02/2021 CLINICAL DATA:  Head trauma, minor (Age >= 65y) EXAM: CT HEAD WITHOUT CONTRAST TECHNIQUE: Contiguous axial images were obtained from the base of the skull through the vertex without intravenous contrast. COMPARISON:  10/03/2020 FINDINGS: Brain: There is no acute intracranial hemorrhage, mass effect, or edema. No new loss of gray-white differentiation. Right inferior frontal encephalomalacia. Chronic right  thalamic infarct. Additional patchy and confluent hypoattenuation in the supratentorial white matter probably reflects stable chronic microvascular ischemic changes. There is no extra-axial fluid collection. Ventricles and sulci are stable in size and configuration. Vascular: There is atherosclerotic calcification at the skull base. Skull: Calvarium is unremarkable. Sinuses/Orbits: No acute finding. Other: None. IMPRESSION: No evidence of acute intracranial injury. Chronic/nonemergent findings detailed above. Electronically Signed   By: Macy Mis M.D.   On: 05/02/2021 14:40   DG Shoulder Left  Result Date: 05/02/2021 CLINICAL DATA:  Fall,  shoulder pain EXAM: LEFT SHOULDER - 2+ VIEW COMPARISON:  None. FINDINGS: No acute fracture or dislocation identified. Moderate narrowing of the glenohumeral joint with inferior marginal osteophytes. Bones are osteopenic. A few old left-sided rib fractures are noted. IMPRESSION: No acute osseous abnormality identified. Electronically Signed   By: Ofilia Neas M.D.   On: 05/02/2021 15:05   DG Hand Complete Left  Result Date: 05/02/2021 CLINICAL DATA:  Hand pain after a fall. EXAM: LEFT HAND - COMPLETE 3+ VIEW COMPARISON:  None. FINDINGS: No acute fracture or dislocation. Severe scaphotrapeziotrapezoid and first Ingram joint osteoarthritis. Mild thumb IP and fifth DIP joint osteoarthritis. Osteopenia. Soft tissues are unremarkable. IMPRESSION: 1. No acute osseous abnormality. Electronically Signed   By: Titus Dubin M.D.   On: 05/02/2021 15:02    ____________________________________________   PROCEDURES  Procedure(s) performed:   Procedures  None ____________________________________________   INITIAL IMPRESSION / ASSESSMENT AND PLAN / ED COURSE  Pertinent labs & imaging results that were available during my care of the patient were reviewed by me and considered in my medical decision making (see chart for details).   Patient presents to the  emergency department for evaluation after a mechanical fall in the parking lot.  She has a skin tear to the left elbow which is hemostatic.  Will clean and dress the wound.  X-rays obtained including CT imaging of the head of the patient's anticoagulated status.  No acute fracture, dislocation, hemorrhage on scans.  Patient is at her baseline per daughter at bedside. Plan for discharge with PCP follow up plan.    ____________________________________________  FINAL CLINICAL IMPRESSION(S) / ED DIAGNOSES  Final diagnoses:  Fall, initial encounter  Abrasion of left elbow, initial encounter    Note:  This document was prepared using Dragon voice recognition software and may include unintentional dictation errors.  Nanda Quinton, MD, [facility]    Tawny Raspberry, [place], MD 05/03/21 [phone]

## 2021-05-02 NOTE — Discharge Instructions (Signed)
You were seen in the emergency room today after a fall.  Your x-rays did not show any broken bones or bleeding.  Please keep your wound clean and dry.  Follow with your primary care doctor.  Return with any new or suddenly worsening symptoms.

## 2021-05-03 ENCOUNTER — Telehealth: Payer: Self-pay | Admitting: Hematology & Oncology

## 2021-05-03 NOTE — Telephone Encounter (Signed)
Called patient per 05/02/21 sch msg - no answer and no voicemail.   Called daughter- no answer . Left voicemail for daughter to let patient know we have attempted to be  contacted

## 2021-05-04 ENCOUNTER — Telehealth: Payer: Self-pay | Admitting: Hematology & Oncology

## 2021-05-04 NOTE — Telephone Encounter (Signed)
Called patient 2x per 05/02/21 sch msg - unable to leave message.

## 2021-05-10 ENCOUNTER — Inpatient Hospital Stay: Payer: Medicare Other

## 2021-05-10 ENCOUNTER — Encounter: Payer: Self-pay | Admitting: Hematology & Oncology

## 2021-05-10 ENCOUNTER — Other Ambulatory Visit: Payer: Self-pay

## 2021-05-10 ENCOUNTER — Inpatient Hospital Stay (HOSPITAL_BASED_OUTPATIENT_CLINIC_OR_DEPARTMENT_OTHER): Payer: Medicare Other | Admitting: Hematology & Oncology

## 2021-05-10 VITALS — BP 160/74 | HR 57 | Temp 97.6°F | Resp 18 | Wt 110.0 lb

## 2021-05-10 DIAGNOSIS — I2699 Other pulmonary embolism without acute cor pulmonale: Secondary | ICD-10-CM

## 2021-05-10 DIAGNOSIS — D751 Secondary polycythemia: Secondary | ICD-10-CM

## 2021-05-10 DIAGNOSIS — D45 Polycythemia vera: Secondary | ICD-10-CM | POA: Diagnosis not present

## 2021-05-10 DIAGNOSIS — D509 Iron deficiency anemia, unspecified: Secondary | ICD-10-CM | POA: Diagnosis not present

## 2021-05-10 DIAGNOSIS — D500 Iron deficiency anemia secondary to blood loss (chronic): Secondary | ICD-10-CM

## 2021-05-10 DIAGNOSIS — Z853 Personal history of malignant neoplasm of breast: Secondary | ICD-10-CM | POA: Diagnosis not present

## 2021-05-10 DIAGNOSIS — Z7901 Long term (current) use of anticoagulants: Secondary | ICD-10-CM | POA: Diagnosis not present

## 2021-05-10 LAB — CMP (CANCER CENTER ONLY)
ALT: 15 U/L (ref 0–44)
AST: 25 U/L (ref 15–41)
Albumin: 4.2 g/dL (ref 3.5–5.0)
Alkaline Phosphatase: 51 U/L (ref 38–126)
Anion gap: 4 — ABNORMAL LOW (ref 5–15)
BUN: 13 mg/dL (ref 8–23)
CO2: 33 mmol/L — ABNORMAL HIGH (ref 22–32)
Calcium: 10.3 mg/dL (ref 8.9–10.3)
Chloride: 100 mmol/L (ref 98–111)
Creatinine: 0.87 mg/dL (ref 0.44–1.00)
GFR, Estimated: >60 mL/min (ref >60)
Glucose, Bld: 108 mg/dL — ABNORMAL HIGH (ref 70–99)
Potassium: 4.1 mmol/L (ref 3.5–5.1)
Sodium: 137 mmol/L (ref 135–145)
Total Bilirubin: 1.4 mg/dL — ABNORMAL HIGH (ref 0.3–1.2)
Total Protein: 6.9 g/dL (ref 6.5–8.1)

## 2021-05-10 LAB — CBC WITH DIFFERENTIAL (CANCER CENTER ONLY)
Abs Immature Granulocytes: 0.02 10*3/uL (ref 0.00–0.07)
Basophils Absolute: 0.1 10*3/uL (ref 0.0–0.1)
Basophils Relative: 1 %
Eosinophils Absolute: 0.2 10*3/uL (ref 0.0–0.5)
Eosinophils Relative: 3 %
HCT: 47.4 % — ABNORMAL HIGH (ref 36.0–46.0)
Hemoglobin: 15.4 g/dL — ABNORMAL HIGH (ref 12.0–15.0)
Immature Granulocytes: 0 %
Lymphocytes Relative: 12 %
Lymphs Abs: 0.7 10*3/uL (ref 0.7–4.0)
MCH: 34.9 pg — ABNORMAL HIGH (ref 26.0–34.0)
MCHC: 32.5 g/dL (ref 30.0–36.0)
MCV: 107.5 fL — ABNORMAL HIGH (ref 80.0–100.0)
Monocytes Absolute: 0.5 10*3/uL (ref 0.1–1.0)
Monocytes Relative: 9 %
Neutro Abs: 4.6 10*3/uL (ref 1.7–7.7)
Neutrophils Relative %: 75 %
Platelet Count: 296 10*3/uL (ref 150–400)
RBC: 4.41 MIL/uL (ref 3.87–5.11)
RDW: 12 % (ref 11.5–15.5)
WBC Count: 6.1 10*3/uL (ref 4.0–10.5)
nRBC: 0 % (ref 0.0–0.2)

## 2021-05-10 LAB — LACTATE DEHYDROGENASE: LDH: 170 U/L (ref 98–192)

## 2021-05-10 NOTE — Progress Notes (Signed)
Hematology and Oncology Follow Up Visit  [name] [id number] [birth year] 87 y.o. 05/10/2021   Principle Diagnosis:  Polycythemia vera - JAK2 positive Stage I (T1bN0M0 ) infiltrating duct carcinoma the left breast  Iron deficiency anemia -- symptomatic Pulmonary embolism-11/30/2020   Past Therapy: Femara 2.5 mg by mouth daily - stopped 08/2015   Current Therapy:        Phlebotomy to maintain hematocrit below 45% Hydrea 500 mg by mouth daily -- re-started on 07/16/2019 --DC on 02/15/2021 IV Iron as needed Xarelto 15 mg p.o. daily-change from Eliquis on 02/15/2021 -- d/c on 04/28/2021 EC ASA 162 mg po q day -- start on 05/10/2021   Interim History: [name] is here today for follow-up.  Unfortunate, she has not had a good time lately.  She apparently fell.  Thankfully I will think she broke anything.  She is on Xarelto.  She stopped taking the Xarelto because of expense.  I just do not think we can get the Xarelto any cheaper for her.  She is by her self.  I do know that she does have some family.  They do help her out.  We were supposed to get a CT angiogram of her chest today.  Unfortunately this was not done.  We will have to get this when she comes back.  Again she does not want to pay for the Xarelto.  Eliquis is also very expensive.  She agrees to baby aspirin.  We will try to get her on 2 baby aspirin.  I told her that she must take enteric coated aspirin with food.  She understands this.  She has had no problems with nausea or vomiting.  It is hard to say how much she is really eating right now.  She has occasional headaches.  She has had no cough.  Overall, I think that her performance status is ECOG 3.    Medications:  Allergies as of 05/10/2021       Reactions   Penicillins Hives, Rash, Other (See Comments)   Has patient had a PCN reaction causing immediate rash, facial/tongue/throat swelling, SOB or lightheadedness with hypotension: Yes Has patient had  a PCN reaction causing severe rash involving mucus membranes or skin necrosis: yes Has patient had a PCN reaction that required hospitalization: No Has patient had a PCN reaction occurring within the last 10 years: no If all of the above answers are "NO", then may proceed with Cephalosporin use. Other Reaction: OTHER REACTION   Valsartan Itching, Other (See Comments)   Atorvastatin Diarrhea   Carvedilol Other (See Comments)   "teeth hurt when I took it"   Ezetimibe Nausea Only   Fluvastatin Sodium Other (See Comments)   "Can't remember"   Magnesium Hydroxide Other (See Comments)   REACTION: triggers HAs   Meloxicam Other (See Comments)   Pt seeing auro's / spots.     Pneumovax [pneumococcal Polysaccharide Vaccine] Other (See Comments)   Local reaction   Quinapril Hcl Other (See Comments)    "feelings of tiredness"   Simvastatin Diarrhea   Topamax [topiramate] Itching   Vit D-vit E-safflower Oil Other (See Comments)   Headaches        Medication List        Accurate as of May 10, 2021  1:30 PM. If you have any questions, ask your nurse or doctor.          acetaminophen 500 MG tablet Commonly known as: TYLENOL Take 500 mg by mouth every 6 (  six) hours as needed for mild pain.   alendronate 70 MG tablet Commonly known as: FOSAMAX Take 1 tablet (70 mg total) by mouth every 7 (seven) days. Take with a full glass of water on an empty stomach.   betamethasone dipropionate 0.05 % cream Apply topically 2 (two) times daily.   furosemide 20 MG tablet Commonly known as: LASIX Take 1 tablet (20 mg total) by mouth every other day.   HYDROcodone-acetaminophen 5-325 MG tablet Commonly known as: NORCO/VICODIN Take 1 tablet by mouth 2 (two) times daily.   lactose free nutrition Liqd Take 237 mLs by mouth daily.   LORazepam 0.5 MG tablet Commonly known as: ATIVAN TAKE 1 TABLET(0.5 MG) BY MOUTH TWICE DAILY AS NEEDED FOR ANXIETY   metoprolol tartrate 50 MG  tablet Commonly known as: LOPRESSOR Take 1 tablet (50 mg total) by mouth 2 (two) times daily.   nitroGLYCERIN 0.4 MG SL tablet Commonly known as: NITROSTAT Place 1 tablet (0.4 mg total) under the tongue every 5 (five) minutes x 3 doses as needed for chest pain.   omeprazole 20 MG capsule Commonly known as: PRILOSEC Take 1 capsule (20 mg total) by mouth daily.   pantoprazole 40 MG tablet Commonly known as: PROTONIX Take 1 tablet (40 mg total) by mouth daily.   valACYclovir 500 MG tablet Commonly known as: VALTREX TAKE 2 TABLETS BY MOUTH AT ONSET OF RASH AND 2 TABLETS 12 HOURS LATER AS NEEDED   Xarelto 15 MG Tabs tablet Generic drug: Rivaroxaban Take 1 tablet (15 mg total) by mouth daily.        Allergies:  Allergies  Allergen Reactions   Penicillins Hives, Rash and Other (See Comments)    Has patient had a PCN reaction causing immediate rash, facial/tongue/throat swelling, SOB or lightheadedness with hypotension: Yes Has patient had a PCN reaction causing severe rash involving mucus membranes or skin necrosis: yes Has patient had a PCN reaction that required hospitalization: No Has patient had a PCN reaction occurring within the last 10 years: no If all of the above answers are "NO", then may proceed with Cephalosporin use.  Other Reaction: OTHER REACTION   Valsartan Itching and Other (See Comments)   Atorvastatin Diarrhea   Carvedilol Other (See Comments)    "teeth hurt when I took it"    Ezetimibe Nausea Only   Fluvastatin Sodium Other (See Comments)    "Can't remember"   Magnesium Hydroxide Other (See Comments)    REACTION: triggers HAs   Meloxicam Other (See Comments)    Pt seeing auro's / spots.     Pneumovax [Pneumococcal Polysaccharide Vaccine] Other (See Comments)    Local reaction   Quinapril Hcl Other (See Comments)     "feelings of tiredness"   Simvastatin Diarrhea   Topamax [Topiramate] Itching   Vit D-Vit E-Safflower Oil Other (See Comments)     Headaches    Past Medical History, Surgical history, Social history, and Family History were reviewed and updated.  Review of Systems: Review of Systems  Constitutional:  Positive for malaise/fatigue and weight loss.  HENT: Negative.    Eyes: Negative.   Respiratory: Negative.    Cardiovascular: Negative.   Gastrointestinal: Negative.   Genitourinary: Negative.   Musculoskeletal:  Positive for back pain and joint pain.  Skin: Negative.   Neurological: Negative.   Endo/Heme/Allergies: Negative.   Psychiatric/Behavioral: Negative.      Physical Exam:  weight is 110 lb (49.9 kg). Her oral temperature is 97.6 F (36.4 C). Her blood  pressure is 160/74 (abnormal) and her pulse is 57 (abnormal). Her respiration is 18 and oxygen saturation is 97%.   Wt Readings from Last 3 Encounters:  05/10/21 110 lb (49.9 kg)  03/09/21 109 lb (49.4 kg)  02/15/21 109 lb (49.4 kg)    Physical Exam Vitals reviewed.  HENT:     Head: Normocephalic and atraumatic.  Eyes:     Pupils: Pupils are equal, round, and reactive to light.  Cardiovascular:     Rate and Rhythm: Normal rate and regular rhythm.     Heart sounds: Normal heart sounds.  Pulmonary:     Effort: Pulmonary effort is normal.     Breath sounds: Normal breath sounds.  Abdominal:     General: Bowel sounds are normal.     Palpations: Abdomen is soft.  Musculoskeletal:        General: No tenderness or deformity. Normal range of motion.     Cervical back: Normal range of motion.  Lymphadenopathy:     Cervical: No cervical adenopathy.  Skin:    General: Skin is warm and dry.     Findings: No erythema or rash.  Neurological:     Mental Status: She is alert and oriented to person, place, and time.  Psychiatric:        Behavior: Behavior normal.        Thought Content: Thought content normal.        Judgment: Judgment normal.      Lab Results  Component Value Date   WBC 6.1 05/10/2021   HGB 15.4 (H) 05/10/2021   HCT 47.4 (H)  05/10/2021   MCV 107.5 (H) 05/10/2021   PLT 296 05/10/2021   Lab Results  Component Value Date   FERRITIN 442 (H) 02/15/2021   IRON 112 02/15/2021   TIBC 260 02/15/2021   UIBC 148 02/15/2021   IRONPCTSAT 43 02/15/2021   Lab Results  Component Value Date   RETICCTPCT 0.8 02/02/2015   RBC 4.41 05/10/2021   RETICCTABS 41.4 02/02/2015   No results found for: KPAFRELGTCHN, LAMBDASER, KAPLAMBRATIO No results found for: IGGSERUM, IGA, IGMSERUM No results found for: Kathrynn Ducking, MSPIKE, SPEI   Chemistry      Component Value Date/Time   NA 137 05/10/2021 1202   NA 139 03/22/2021 1453   NA 142 03/25/2017 1457   NA 139 04/10/2016 0855   K 4.1 05/10/2021 1202   K 4.1 03/25/2017 1457   K 4.2 04/10/2016 0855   CL 100 05/10/2021 1202   CL 96 (L) 03/25/2017 1457   CO2 33 (H) 05/10/2021 1202   CO2 32 03/25/2017 1457   CO2 29 04/10/2016 0855   BUN 13 05/10/2021 1202   BUN 9 03/22/2021 1453   BUN 7 03/25/2017 1457   BUN 10.0 04/10/2016 0855   CREATININE 0.87 05/10/2021 1202   CREATININE 0.55 (L) 04/22/2020 1444   CREATININE 0.8 04/10/2016 0855      Component Value Date/Time   CALCIUM 10.3 05/10/2021 1202   CALCIUM 9.8 03/25/2017 1457   CALCIUM 9.5 04/10/2016 0855   ALKPHOS 51 05/10/2021 1202   ALKPHOS 86 (H) 03/25/2017 1457   ALKPHOS 85 04/10/2016 0855   AST 25 05/10/2021 1202   AST 19 04/10/2016 0855   ALT 15 05/10/2021 1202   ALT 15 03/25/2017 1457   ALT 13 04/10/2016 0855   BILITOT 1.4 (H) 05/10/2021 1202   BILITOT 1.26 (H) 04/10/2016 0855       Impression and  Plan: [name] is a very pleasant 87 yo caucasian female with polycythemia and history of stage I infiltrating ductal carcinoma of the left breast with lumpectomy in 2013 as well as polycythemia, JAK2 positive.   We will get her on aspirin.  Hopefully this will be tolerated.  Again she will take this with food and she will take enteric-coated aspirin.  I just  feel bad for her.  I just wish her quality of life was a little bit better.  We will try to get her through January.  I just hate to have her come back in with any potential bad weather.  We will get a CT angiogram when we see her back.   Volanda Napoleon, MD 12/28/20221:30 PM

## 2021-05-11 ENCOUNTER — Telehealth: Payer: Self-pay | Admitting: Hematology & Oncology

## 2021-05-11 NOTE — Telephone Encounter (Signed)
Scheduled appt per 12/28 los - no answer and unable to leave message. Mailed letter with appt date and time

## 2021-05-11 NOTE — Telephone Encounter (Signed)
Attempted to contact patient to schedule per los 12/28. Voicemail was not set up so unable to leave message but she still needs scheduling.

## 2021-05-15 ENCOUNTER — Encounter: Payer: Self-pay | Admitting: Hematology & Oncology

## 2021-05-18 ENCOUNTER — Telehealth: Payer: Self-pay | Admitting: Internal Medicine

## 2021-05-18 NOTE — Telephone Encounter (Signed)
Requesting: lorazepam 0.5mg  Contract: 04/22/2020 UDS: 02/13/2021 Last Visit: 02/13/2021 Next Visit: 05/22/2021 Last Refill: 03/15/2021 #60 and 1RF  Please Advise

## 2021-05-19 NOTE — Telephone Encounter (Signed)
PDMP okay, Rx sent 

## 2021-05-22 ENCOUNTER — Ambulatory Visit (INDEPENDENT_AMBULATORY_CARE_PROVIDER_SITE_OTHER): Payer: Medicare Other | Admitting: Internal Medicine

## 2021-05-22 ENCOUNTER — Encounter: Payer: Self-pay | Admitting: Internal Medicine

## 2021-05-22 VITALS — BP 116/66 | HR 67 | Temp 97.7°F | Resp 16 | Wt 114.4 lb

## 2021-05-22 DIAGNOSIS — I10 Essential (primary) hypertension: Secondary | ICD-10-CM

## 2021-05-22 DIAGNOSIS — G4452 New daily persistent headache (NDPH): Secondary | ICD-10-CM

## 2021-05-22 DIAGNOSIS — R269 Unspecified abnormalities of gait and mobility: Secondary | ICD-10-CM | POA: Diagnosis not present

## 2021-05-22 DIAGNOSIS — R296 Repeated falls: Secondary | ICD-10-CM

## 2021-05-22 DIAGNOSIS — M159 Polyosteoarthritis, unspecified: Secondary | ICD-10-CM | POA: Diagnosis not present

## 2021-05-22 MED ORDER — HYDROCODONE-ACETAMINOPHEN 5-325 MG PO TABS: 1.0000 | ORAL_TABLET | Freq: Every day | ORAL | Status: DC | PRN

## 2021-05-22 MED ORDER — APIXABAN 5 MG PO TABS: 5.0000 mg | ORAL_TABLET | Freq: Two times a day (BID) | ORAL | Status: DC

## 2021-05-22 NOTE — Assessment & Plan Note (Signed)
Recent labs reviewed: No need for blood work today. HTN: Saw cardiology 03/09/2021, they started Lasix for BP control, subsequent BMP was okay.  Plan: Continue Lasix, metoprolol. Polycythemia, history of PE and DVT. Saw hematology 05/10/2021, due to cost, they agreed to stop oral anticoagulants and started a baby aspirin however patient found a bottle of Eliquis and is  currently taking it.  I recommend the following: Finish her current Eliquis and then switch  to aspirin daily.  She verbalized understanding, she understands she cannot take both. Gait disorder: Had a fall, went to the ER 05/02/2021, CT head negative.  Advised importance of prevent the next fall, she uses a cane, also has a walker.  She has already minimize Ativan to twice daily and has self decreased hydrocodone  to 1 a day. Plan: PT referral Back pain, chronic pain, left hip pain: See above, has self decrease hydrocodone to 1 tablet daily.    Forehead pain: Ongoing, no associated nausea, vomiting, no weight loss.  No fever.  Pain of unclear etiology,  recent CT head at the ER with no acute changes.  Plan: Refer to neurology. Social: Recently moved to ALF.  Still drives (limited) RTC 3 to 27-month

## 2021-05-22 NOTE — Addendum Note (Signed)
Addended byDamita Dunnings D on: 05/22/2021 04:48 PM   Modules accepted: Orders

## 2021-05-22 NOTE — Patient Instructions (Signed)
Take hydrocodone only once daily as you are doing  You can take Tylenol to 3 times a day  We are referring you to physical therapy.  We are referring you to a neurologist in [facility].  Check the  blood pressure regularly BP GOAL is between 110/65 and  135/85. If it is consistently higher or lower, let me know     GO TO THE FRONT DESK, PLEASE SCHEDULE YOUR APPOINTMENTS Come back for checkup in 3 to 4 months

## 2021-05-22 NOTE — Progress Notes (Signed)
Subjective:    Patient ID: [name], female    DOB: 12-27-1929, 88 y.o.   MRN: [id number]  DOS:  05/22/2021 Type of visit - description: Follow-up  Since the last visit, saw hematology, notes reviewed. Also had a fall, went to the ER 05/02/2021, work-up negative for acute fractures. Continue with pain located in the forehead.  Is getting more frequent, no associated nausea or vomiting. Pain management: Reported is taking less hydrocodone. Anticoagulation: Reports she found some Eliquis and is taking it.  See assessment and plan.  Wt Readings from Last 3 Encounters:  05/22/21 114 lb 6.4 oz (51.9 kg)  05/10/21 110 lb (49.9 kg)  03/09/21 109 lb (49.4 kg)     Review of Systems See above   Past Medical History:  Diagnosis Date   CAD (coronary artery disease)    mild nonobstructive disease on cath in 2003   Cancer [facility])    Cardiomyopathy    Probable Takotsubo, severe CP w/ normal cath in 1994. Severe CP in 2003 w/ widespread T wave inversions on ECG. Cath w/ minimal coronary disease but LV-gram showed periapical severe hypokinesis and basilar hyperkinesia (EF 40%). Last echo in 4/09 confirmed full LV functional recovery with EF 60%, no regional wall motion abnormalities, mild to moderate LVH.   Chronic diastolic CHF (congestive heart failure) ([place]) 09/13/2019   CVA (cerebral infarction)    Depression with anxiety 03/29/2011   lost husband 3'09   DVT (deep venous thrombosis) ([place])    after venous ablation, R leg   Fracture 09/10/2007   L2, status post vertebroplasty of L2 performed by IR   GERD (gastroesophageal reflux disease)    Hemorrhoids    Hiatal hernia 03/29/2011   no nerve problems   Hyperlipidemia    Hyperplastic colon polyp 06/2001   Hypertension    Iron deficiency anemia due to chronic blood loss 12/30/2017   OA (osteoarthritis) of knee 03/29/2011   w/ bilateral knee pain-not a problem now   OSA (obstructive sleep apnea) 03/29/2011   no cpap used, not a  problem now.   Osteoporosis    Otalgia of both ears    Dr. Simeon Craft   Polycythemia    Stroke [facility]) 03/29/2011   CVA x2 -last 10'12-?TIA(visual problems)   Varicose vein     Past Surgical History:  Procedure Laterality Date   APPENDECTOMY     BACK SURGERY     BREAST BIOPSY     BREAST CYST EXCISION     BREAST LUMPECTOMY Left 2013   left stage I left breast cancer   CHOLECYSTECTOMY  04/09/2011   Procedure: LAPAROSCOPIC CHOLECYSTECTOMY WITH INTRAOPERATIVE CHOLANGIOGRAM;  Surgeon: Odis Hollingshead, MD;  Location: [facility];  Service: General;  Laterality: N/A;   Dental Extraction     L maxillary molar   INTRAMEDULLARY (IM) NAIL INTERTROCHANTERIC Left 12/25/2019   Procedure: INTRAMEDULLARY (IM) NAIL INTERTROCHANTRIC;  Surgeon: Paralee Cancel, MD;  Location: [facility];  Service: Orthopedics;  Laterality: Left;   IR KYPHO THORACIC WITH BONE BIOPSY  09/15/2019   IR KYPHO THORACIC WITH BONE BIOPSY  07/13/2020       IR KYPHO THORACIC WITH BONE BIOPSY  07/13/2020   IR VERTEBROPLASTY CERV/THOR BX INC UNI/BIL INC/INJECT/IMAGING  11/10/2019   KYPHOPLASTY N/A 11/16/2020   Procedure: T11,  L1,KYPHOPLASTY;  Surgeon: Melina Schools, MD;  Location: [place];  Service: Orthopedics;  Laterality: N/A;   KYPHOSIS SURGERY  08/2007   cement used   OVARIAN CYST SURGERY  left   RADIOLOGY WITH ANESTHESIA N/A 11/10/2019   Procedure: VERTEBROPLASTY;  Surgeon: Luanne Bras, MD;  Location: [place];  Service: Radiology;  Laterality: N/A;   TONSILLECTOMY     TOTAL KNEE ARTHROPLASTY  06/2010   left   TUBAL LIGATION      Current Outpatient Medications  Medication Instructions   acetaminophen (TYLENOL) 500 mg, Oral, Every 6 hours PRN   alendronate (FOSAMAX) 70 mg, Oral, Every 7 days, Take with a full glass of water on an empty stomach.   betamethasone dipropionate 0.05 % cream Topical, 2 times daily   furosemide (LASIX) 20 mg, Oral, Every other day   HYDROcodone-acetaminophen (NORCO/VICODIN) 5-325 MG tablet 1 tablet, Oral,  2 times daily   lactose free nutrition (BOOST) LIQD 237 mLs, Oral, Daily   LORazepam (ATIVAN) 0.5 MG tablet TAKE 1 TABLET(0.5 MG) BY MOUTH TWICE DAILY AS NEEDED FOR ANXIETY   metoprolol tartrate (LOPRESSOR) 50 mg, Oral, 2 times daily   nitroGLYCERIN (NITROSTAT) 0.4 mg, Sublingual, Every 5 min x3 PRN   omeprazole (PRILOSEC) 20 mg, Oral, Daily   pantoprazole (PROTONIX) 40 mg, Oral, Daily   valACYclovir (VALTREX) 500 MG tablet TAKE 2 TABLETS BY MOUTH AT ONSET OF RASH AND 2 TABLETS 12 HOURS LATER AS NEEDED   Xarelto 15 mg, Oral, Daily       Objective:   Physical Exam BP 116/66    Pulse 67    Temp 97.7 F (36.5 C)    Resp 16    Wt 114 lb 6.4 oz (51.9 kg)    SpO2 91%    BMI 18.46 kg/m  General:   Well developed, NAD, BMI noted. HEENT:  Normocephalic . Face symmetric, atraumatic Lungs:  Decreased breath sounds. Normal respiratory effort, no intercostal retractions, no accessory muscle use. Heart: RRR,  no murmur.  Lower extremities: no pretibial edema bilaterally  Skin: Not pale. Not jaundice Neurologic:  alert & oriented X3.  Speech normal, gait assisted by a cane. Psych--  Cognition and judgment appear intact.  Cooperative with normal attention span and concentration.  Behavior appropriate. No anxious or depressed appearing.      Assessment     Assessment HTN Dyslipidemia: Intolerant to most medicines including Zetia, Pravachol, Niaspan Depression, anxiety , insomnia - on ativan bid (03-2015 citalopram cause drowsiness, 04-2015 prozac:diarrhea) Hem-Onc: POLYCYTEMIA: sees hem-onc H/o breast ca MSK: ---DJD  ---Osteoporosis:  T score -2.8 (02-2013): declined treatment 06-2017 ---L2 fractures, s/p vertebral plasty 2009, s/p vertebral plasty T 8 07/2020 Migraine headaches - Tylenol #3 (rarely) GI: GERD- intolerant to prevacid, visual disturbances HH, diverticulitis, Colon polyps h/o "functional " LLQ abd pain CV: ---Cardiomyopathy Probable Takotsubo cardiomyopathy: CP w/  normal cath in 1994.   Severe CP  2003 with widespread T wave inversions on ECG.  Cath with minimal coronary disease but LV-gram showed periapical severe hypokinesis and basilar hyperkinesia (EF 40%) >>> ECHO 4/09 confirmed full LV functional recovery with EF 60%, mild LVH. 2012: Nl EF per echo, (-) Lexiscan 05/21/18 ECHO Nl/stable ---Stroke / TIA 2012 ---ICB seizure 10/2019 was rx keppra, self d/c 05/2019 Sleep apnea 2012, , not on CPAP Hematology: ---H/o DVT, right leg after venous ablation --- Pulmonary emboli  dx 12-25-20 Raynaud phenomena, DX 05/2018   PLAN Recent labs reviewed: No need for blood work today. HTN: Saw cardiology 03/09/2021, they started Lasix for BP control, subsequent BMP was okay.  Plan: Continue Lasix, metoprolol. Polycythemia, history of PE and DVT. Saw hematology 05/10/2021, due to cost, they agreed to  stop oral anticoagulants and started a baby aspirin however patient found a bottle of Eliquis and is  currently taking it.  I recommend the following: Finish her current Eliquis and then switch  to aspirin daily.  She verbalized understanding, she understands she cannot take both. Gait disorder: Had a fall, went to the ER 05/02/2021, CT head negative.  Advised importance of prevent the next fall, she uses a cane, also has a walker.  She has already minimize Ativan to twice daily and has self decreased hydrocodone  to 1 a day. Plan: PT referral Back pain, chronic pain, left hip pain: See above, has self decrease hydrocodone to 1 tablet daily.    Forehead pain: Ongoing, no associated nausea, vomiting, no weight loss.  No fever.  Pain of unclear etiology,  recent CT head at the ER with no acute changes.  Plan: Refer to neurology. Social: Recently moved to ALF.  Still drives (limited) RTC 3 to 62-month  Time spent 35 minutes.  There was some confusion about anticoagulants, took a long conversation to understand that she is taking Eliquis as described above. We also talk about  the importance of preventing next fall. Chart reviewed.   This visit occurred during the SARS-CoV-2 public health emergency.  Safety protocols were in place, including screening questions prior to the visit, additional usage of staff PPE, and extensive cleaning of exam room while observing appropriate contact time as indicated for disinfecting solutions.

## 2021-05-24 DIAGNOSIS — M810 Age-related osteoporosis without current pathological fracture: Secondary | ICD-10-CM | POA: Diagnosis not present

## 2021-05-24 DIAGNOSIS — I69398 Other sequelae of cerebral infarction: Secondary | ICD-10-CM | POA: Diagnosis not present

## 2021-05-24 DIAGNOSIS — I429 Cardiomyopathy, unspecified: Secondary | ICD-10-CM | POA: Diagnosis not present

## 2021-05-24 DIAGNOSIS — H539 Unspecified visual disturbance: Secondary | ICD-10-CM | POA: Diagnosis not present

## 2021-05-24 DIAGNOSIS — I110 Hypertensive heart disease with heart failure: Secondary | ICD-10-CM | POA: Diagnosis not present

## 2021-05-24 DIAGNOSIS — I5032 Chronic diastolic (congestive) heart failure: Secondary | ICD-10-CM | POA: Diagnosis not present

## 2021-05-24 DIAGNOSIS — I7300 Raynaud's syndrome without gangrene: Secondary | ICD-10-CM | POA: Diagnosis not present

## 2021-05-24 DIAGNOSIS — F32A Depression, unspecified: Secondary | ICD-10-CM | POA: Diagnosis not present

## 2021-05-24 DIAGNOSIS — Z9181 History of falling: Secondary | ICD-10-CM | POA: Diagnosis not present

## 2021-05-24 DIAGNOSIS — Z86718 Personal history of other venous thrombosis and embolism: Secondary | ICD-10-CM | POA: Diagnosis not present

## 2021-05-24 DIAGNOSIS — W19XXXD Unspecified fall, subsequent encounter: Secondary | ICD-10-CM | POA: Diagnosis not present

## 2021-05-24 DIAGNOSIS — K449 Diaphragmatic hernia without obstruction or gangrene: Secondary | ICD-10-CM | POA: Diagnosis not present

## 2021-05-24 DIAGNOSIS — Z7901 Long term (current) use of anticoagulants: Secondary | ICD-10-CM | POA: Diagnosis not present

## 2021-05-24 DIAGNOSIS — M159 Polyosteoarthritis, unspecified: Secondary | ICD-10-CM | POA: Diagnosis not present

## 2021-05-24 DIAGNOSIS — G43909 Migraine, unspecified, not intractable, without status migrainosus: Secondary | ICD-10-CM | POA: Diagnosis not present

## 2021-05-24 DIAGNOSIS — Z86711 Personal history of pulmonary embolism: Secondary | ICD-10-CM | POA: Diagnosis not present

## 2021-05-24 DIAGNOSIS — G4733 Obstructive sleep apnea (adult) (pediatric): Secondary | ICD-10-CM | POA: Diagnosis not present

## 2021-05-24 DIAGNOSIS — I2510 Atherosclerotic heart disease of native coronary artery without angina pectoris: Secondary | ICD-10-CM | POA: Diagnosis not present

## 2021-05-24 DIAGNOSIS — E785 Hyperlipidemia, unspecified: Secondary | ICD-10-CM | POA: Diagnosis not present

## 2021-05-24 DIAGNOSIS — K219 Gastro-esophageal reflux disease without esophagitis: Secondary | ICD-10-CM | POA: Diagnosis not present

## 2021-05-24 DIAGNOSIS — Z79891 Long term (current) use of opiate analgesic: Secondary | ICD-10-CM | POA: Diagnosis not present

## 2021-05-24 DIAGNOSIS — R296 Repeated falls: Secondary | ICD-10-CM | POA: Diagnosis not present

## 2021-05-24 DIAGNOSIS — D751 Secondary polycythemia: Secondary | ICD-10-CM | POA: Diagnosis not present

## 2021-05-24 DIAGNOSIS — G8929 Other chronic pain: Secondary | ICD-10-CM | POA: Diagnosis not present

## 2021-05-25 ENCOUNTER — Telehealth: Payer: Self-pay | Admitting: Internal Medicine

## 2021-05-25 NOTE — Telephone Encounter (Signed)
Spoke w/ [employer]- verbal orders given.

## 2021-05-25 NOTE — Telephone Encounter (Signed)
Caller/Agency: [facility] Number: [phone] Requesting Nursing Frequency: 1 time a week for 9 weeks. Please advise.

## 2021-05-29 ENCOUNTER — Other Ambulatory Visit: Payer: Self-pay

## 2021-05-29 ENCOUNTER — Encounter (HOSPITAL_BASED_OUTPATIENT_CLINIC_OR_DEPARTMENT_OTHER): Payer: Self-pay

## 2021-05-29 ENCOUNTER — Emergency Department (HOSPITAL_BASED_OUTPATIENT_CLINIC_OR_DEPARTMENT_OTHER)
Admission: EM | Admit: 2021-05-29 | Discharge: 2021-05-29 | Disposition: A | Discharge status: Home / Self Care | Payer: Medicare Other | Attending: Emergency Medicine | Admitting: Emergency Medicine

## 2021-05-29 ENCOUNTER — Emergency Department (HOSPITAL_BASED_OUTPATIENT_CLINIC_OR_DEPARTMENT_OTHER): Admission: RE | Admit: 2021-05-29 | Payer: Medicare Other | Source: Ambulatory Visit

## 2021-05-29 DIAGNOSIS — Z79899 Other long term (current) drug therapy: Secondary | ICD-10-CM | POA: Insufficient documentation

## 2021-05-29 DIAGNOSIS — L03115 Cellulitis of right lower limb: Secondary | ICD-10-CM | POA: Insufficient documentation

## 2021-05-29 DIAGNOSIS — M79661 Pain in right lower leg: Secondary | ICD-10-CM | POA: Diagnosis present

## 2021-05-29 DIAGNOSIS — Z5321 Procedure and treatment not carried out due to patient leaving prior to being seen by health care provider: Secondary | ICD-10-CM | POA: Diagnosis not present

## 2021-05-29 DIAGNOSIS — Z7901 Long term (current) use of anticoagulants: Secondary | ICD-10-CM | POA: Insufficient documentation

## 2021-05-29 DIAGNOSIS — M79604 Pain in right leg: Secondary | ICD-10-CM | POA: Diagnosis not present

## 2021-05-29 DIAGNOSIS — I2699 Other pulmonary embolism without acute cor pulmonale: Secondary | ICD-10-CM | POA: Diagnosis not present

## 2021-05-29 MED ORDER — ACETAMINOPHEN 325 MG PO TABS
650.0000 mg | ORAL_TABLET | Freq: Once | ORAL | Status: DC
  Filled 2021-05-29: qty 2

## 2021-05-29 NOTE — ED Notes (Signed)
Pt noted to be standing in hall. Informed this nurse that she has to leave because her daughter is waiting for her in the car. Pt states she has been here 4 hours, however it had only been 2. This RN informed pt of risk of leaving, asked if someone else could pick her up or even if she could take a cab. Pt states she understands risks, states she will come back or go see her PCP. Leaving AMA, provider notified. NAD at time of departure

## 2021-05-29 NOTE — ED Provider Notes (Addendum)
[place] EMERGENCY DEPARTMENT Provider Note   CSN: [id number] Arrival date & time: 05/29/21  1305     History  Chief Complaint  Patient presents with   Ankle Pain    [name] is a 88 y.o. female.   Ankle Pain Associated symptoms: no fever    Patient is an 88 year old female with past medical history significant for polycythemia vera, iron deficiency anemia, PE diagnosed 11/30/2020.  Patient presented emergency room today with complaints of an area of pain in her right calf.  She has a small bump here.  She states she is concerned about a blood clot since she was told that she had a blood clot in her lung.  She is currently on Eliquis and takes it twice daily as prescribed she has had no missed doses in the past 1 month she states she has considered switching to a different anticoagulant because of cost but has not done this.  No recent surgeries, hospitalization, long travel, hemoptysis, estrogen containing OCP. No unilateral leg swelling.        Home Medications Prior to Admission medications   Medication Sig Start Date End Date Taking? Authorizing Provider  acetaminophen (TYLENOL) 500 MG tablet Take 500 mg by mouth every 6 (six) hours as needed for mild pain.    [provider]  alendronate (FOSAMAX) 70 MG tablet Take 1 tablet (70 mg total) by mouth every 7 (seven) days. Take with a full glass of water on an empty stomach. 06/08/20   Colon Branch, MD  apixaban (ELIQUIS) 5 MG TABS tablet Take 1 tablet (5 mg total) by mouth 2 (two) times daily. 05/22/21   Colon [place], MD  betamethasone dipropionate 0.05 % cream Apply topically 2 (two) times daily. 01/24/21   Debbrah Alar, NP  furosemide (LASIX) 20 MG tablet Take 1 tablet (20 mg total) by mouth every other day. 03/09/21 06/07/21  Richardo Priest, MD  HYDROcodone-acetaminophen (NORCO/VICODIN) 5-325 MG tablet Take 1 tablet by mouth daily as needed for moderate pain. 05/22/21   Colon Branch, MD  lactose  free nutrition (BOOST) LIQD Take 237 mLs by mouth daily.    [provider]  LORazepam (ATIVAN) 0.5 MG tablet TAKE 1 TABLET(0.5 MG) BY MOUTH TWICE DAILY AS NEEDED FOR ANXIETY 05/19/21   Colon Branch, MD  metoprolol tartrate (LOPRESSOR) 50 MG tablet Take 1 tablet (50 mg total) by mouth 2 (two) times daily. 02/13/21   Colon Branch, MD  nitroGLYCERIN (NITROSTAT) 0.4 MG SL tablet Place 1 tablet (0.4 mg total) under the tongue every 5 (five) minutes x 3 doses as needed for chest pain. 07/25/20   Colon [place], MD  omeprazole (PRILOSEC) 20 MG capsule Take 1 capsule (20 mg total) by mouth daily. 10/05/19   Granville Lewis C, PA-C  pantoprazole (PROTONIX) 40 MG tablet Take 1 tablet (40 mg total) by mouth daily. 03/09/21   Richardo Priest, MD  valACYclovir (VALTREX) 500 MG tablet TAKE 2 TABLETS BY MOUTH AT ONSET OF RASH AND 2 TABLETS 12 HOURS LATER AS NEEDED 02/17/21 02/17/22  Volanda Napoleon, MD      Allergies    Penicillins, Valsartan, Atorvastatin, Carvedilol, Ezetimibe, Fluvastatin sodium, Magnesium hydroxide, Meloxicam, Pneumovax [pneumococcal polysaccharide vaccine], Quinapril hcl, Simvastatin, Topamax [topiramate], and Vit d-vit e-safflower oil    Review of Systems   Review of Systems  Constitutional:  Negative for fever.  HENT:  Negative for congestion.   Respiratory:  Negative for shortness of  breath.   Cardiovascular:  Negative for chest pain.  Gastrointestinal:  Negative for abdominal distention.  Musculoskeletal:        Right leg bump/right leg pain  Neurological:  Negative for dizziness and headaches.   Physical Exam Updated Vital Signs BP (!) 187/74 (BP Location: Left Arm)    Pulse 62    Temp 97.7 F (36.5 C) (Oral)    Resp 18    Ht 5\' 3"  (1.6 m)    Wt 53.1 kg    SpO2 98%    BMI 20.73 kg/m  Physical Exam Vitals and nursing note reviewed.  Constitutional:      General: She is not in acute distress.    Appearance: Normal appearance. She is not ill-appearing.  HENT:     Head:  Normocephalic and atraumatic.     Mouth/Throat:     Mouth: Mucous membranes are moist.  Eyes:     General: No scleral icterus.       Right eye: No discharge.        Left eye: No discharge.     Conjunctiva/sclera: Conjunctivae normal.  Pulmonary:     Effort: Pulmonary effort is normal.     Breath sounds: No stridor.  Musculoskeletal:     Comments: No significant lower extremity edema or swelling.  There is a small bump that is neither fluctuant nor is cellulitic appearing on her right calf where to be where she is having some pain.  Neurological:     Mental Status: She is alert and oriented to person, place, and time. Mental status is at baseline.    ED Results / Procedures / Treatments   Labs (all labs ordered are listed, but only abnormal results are displayed) Labs Reviewed - No data to display  EKG None  Radiology No results found.  Procedures Procedures    Medications Ordered in ED Medications - No data to display  ED Course/ Medical Decision Making/ A&P Clinical Course as of 05/29/21 1645  Mon May 29, 2021  1453 3 days of right lower extremity pain.  She states that she is concerned about a blood clot that she has been diagnosed with a blood clot in her lung she is currently on Eliquis and states that she has been taking it as prescribed. [WF]    Clinical Course User Index [WF] Tedd Sias, [place]                           Medical Decision Making  Patient is 88 year old female presented today with nontraumatic right lower extremity pain seems that is been bothering her for the past 2 or 3 days.  She is concerned about a blood clot in her leg since she is being treated for a PE that was found in her lungs.  She denies any chest pain difficulty breathing today.  States that it mostly hurts when she pushes on her right lower extremity there is a small bump there.  On my exam patient is distally neurovascularly intact does have a small bump in the right calf which  is nonfluctuant and there is no cellulitic changes.  The area of her pain does not follow a radicular pattern and she does not have any upper leg pain.  I do not appreciate any swelling.  We do lengthy discussion about the low likelihood of DVT given that she is anticoagulated.  We opted to obtain lower extremity ultrasound.  However after leaving the  room I was informed by RN that patient left saying that she needs to go home and cannot stay any longer.  Patient eloped before I was able to talk to her about staying for the entirety of visit.  She is in no acute distress.  She was informed by RN to return for any new or concerning symptoms.   Earlie Lou, RN 05/29/2021 15:18       Pt noted to be standing in hall. Informed this nurse that she has to leave because her daughter is waiting for her in the car. Pt states she has been here 4 hours, however it had only been 2. This RN informed pt of risk of leaving, asked if someone else could pick her up or even if she could take a cab. Pt states she understands risks, states she will come back or go see her PCP. Leaving AMA, provider notified. NAD at time of departure      Final Clinical Impression(s) / ED Diagnoses Final diagnoses:  None    Rx / DC Orders ED Discharge Orders     None         Tedd Sias, [place] 05/29/21 1646    Tedd Sias, PA 05/29/21 1647    Tedd Sias, [place] 05/29/21 1647    Lucrezia Starch, MD 05/29/21 2156

## 2021-05-29 NOTE — ED Triage Notes (Signed)
Pt c/o right ankle pain started 1/14-denies injury-states she wants a test for a blood clot-NAD-slow gait with own cane

## 2021-06-02 ENCOUNTER — Telehealth: Payer: Self-pay

## 2021-06-02 DIAGNOSIS — F32A Depression, unspecified: Secondary | ICD-10-CM | POA: Diagnosis not present

## 2021-06-02 DIAGNOSIS — I7300 Raynaud's syndrome without gangrene: Secondary | ICD-10-CM | POA: Diagnosis not present

## 2021-06-02 DIAGNOSIS — Z79891 Long term (current) use of opiate analgesic: Secondary | ICD-10-CM | POA: Diagnosis not present

## 2021-06-02 DIAGNOSIS — K449 Diaphragmatic hernia without obstruction or gangrene: Secondary | ICD-10-CM | POA: Diagnosis not present

## 2021-06-02 DIAGNOSIS — D751 Secondary polycythemia: Secondary | ICD-10-CM | POA: Diagnosis not present

## 2021-06-02 DIAGNOSIS — I5032 Chronic diastolic (congestive) heart failure: Secondary | ICD-10-CM | POA: Diagnosis not present

## 2021-06-02 DIAGNOSIS — Z86718 Personal history of other venous thrombosis and embolism: Secondary | ICD-10-CM | POA: Diagnosis not present

## 2021-06-02 DIAGNOSIS — K219 Gastro-esophageal reflux disease without esophagitis: Secondary | ICD-10-CM | POA: Diagnosis not present

## 2021-06-02 DIAGNOSIS — G43909 Migraine, unspecified, not intractable, without status migrainosus: Secondary | ICD-10-CM | POA: Diagnosis not present

## 2021-06-02 DIAGNOSIS — W19XXXD Unspecified fall, subsequent encounter: Secondary | ICD-10-CM | POA: Diagnosis not present

## 2021-06-02 DIAGNOSIS — G4733 Obstructive sleep apnea (adult) (pediatric): Secondary | ICD-10-CM | POA: Diagnosis not present

## 2021-06-02 DIAGNOSIS — G8929 Other chronic pain: Secondary | ICD-10-CM | POA: Diagnosis not present

## 2021-06-02 DIAGNOSIS — E785 Hyperlipidemia, unspecified: Secondary | ICD-10-CM | POA: Diagnosis not present

## 2021-06-02 DIAGNOSIS — F419 Anxiety disorder, unspecified: Secondary | ICD-10-CM

## 2021-06-02 DIAGNOSIS — Z7901 Long term (current) use of anticoagulants: Secondary | ICD-10-CM | POA: Diagnosis not present

## 2021-06-02 DIAGNOSIS — M159 Polyosteoarthritis, unspecified: Secondary | ICD-10-CM | POA: Diagnosis not present

## 2021-06-02 DIAGNOSIS — R296 Repeated falls: Secondary | ICD-10-CM | POA: Diagnosis not present

## 2021-06-02 DIAGNOSIS — Z86711 Personal history of pulmonary embolism: Secondary | ICD-10-CM | POA: Diagnosis not present

## 2021-06-02 DIAGNOSIS — Z9181 History of falling: Secondary | ICD-10-CM | POA: Diagnosis not present

## 2021-06-02 DIAGNOSIS — I2510 Atherosclerotic heart disease of native coronary artery without angina pectoris: Secondary | ICD-10-CM | POA: Diagnosis not present

## 2021-06-02 DIAGNOSIS — I69398 Other sequelae of cerebral infarction: Secondary | ICD-10-CM | POA: Diagnosis not present

## 2021-06-02 DIAGNOSIS — H539 Unspecified visual disturbance: Secondary | ICD-10-CM | POA: Diagnosis not present

## 2021-06-02 DIAGNOSIS — I429 Cardiomyopathy, unspecified: Secondary | ICD-10-CM | POA: Diagnosis not present

## 2021-06-02 DIAGNOSIS — M810 Age-related osteoporosis without current pathological fracture: Secondary | ICD-10-CM | POA: Diagnosis not present

## 2021-06-02 DIAGNOSIS — I110 Hypertensive heart disease with heart failure: Secondary | ICD-10-CM | POA: Diagnosis not present

## 2021-06-02 NOTE — Telephone Encounter (Signed)
Plan of care signed and faxed back to [facility] at [phone]. Form sent for scanning.

## 2021-06-05 ENCOUNTER — Telehealth: Payer: Self-pay | Admitting: Internal Medicine

## 2021-06-05 MED ORDER — HYDROCODONE-ACETAMINOPHEN 5-325 MG PO TABS: 1.0000 | ORAL_TABLET | Freq: Every day | ORAL | 0 refills | Status: DC | PRN

## 2021-06-05 NOTE — Telephone Encounter (Signed)
PDMP okay. She is currently taking 1 tablet daily as needed,  30 tablets sent.

## 2021-06-05 NOTE — Telephone Encounter (Signed)
Medication: HYDROcodone-acetaminophen (NORCO/VICODIN) 5-325 MG tablet   Has the patient contacted their pharmacy? Yes.   (If no, request that the patient contact the pharmacy for the refill.) (If yes, when and what did the pharmacy advise?)  Preferred Pharmacy (with phone number or street name):  [id number] [address], [address] AT [address]  [place], [place] [address]  Phone:  [phone]  Fax:  [phone]   Agent: Please be advised that RX refills may take up to 3 business days. We ask that you follow-up with your pharmacy.

## 2021-06-05 NOTE — Telephone Encounter (Signed)
Requesting: hydrocodone 5-325mg   Contract: 04/22/2020 UDS: 02/13/21 Last Visit: 05/22/21 Next Visit: 06/07/21 Last Refill: 04/13/2021 #60 and 0RF  Please Advise

## 2021-06-07 ENCOUNTER — Ambulatory Visit: Payer: PRIVATE HEALTH INSURANCE | Admitting: Internal Medicine

## 2021-06-09 DIAGNOSIS — R296 Repeated falls: Secondary | ICD-10-CM | POA: Diagnosis not present

## 2021-06-09 DIAGNOSIS — I69398 Other sequelae of cerebral infarction: Secondary | ICD-10-CM | POA: Diagnosis not present

## 2021-06-09 DIAGNOSIS — W19XXXD Unspecified fall, subsequent encounter: Secondary | ICD-10-CM | POA: Diagnosis not present

## 2021-06-09 DIAGNOSIS — Z9181 History of falling: Secondary | ICD-10-CM | POA: Diagnosis not present

## 2021-06-09 DIAGNOSIS — E785 Hyperlipidemia, unspecified: Secondary | ICD-10-CM | POA: Diagnosis not present

## 2021-06-09 DIAGNOSIS — Z79891 Long term (current) use of opiate analgesic: Secondary | ICD-10-CM | POA: Diagnosis not present

## 2021-06-09 DIAGNOSIS — I2510 Atherosclerotic heart disease of native coronary artery without angina pectoris: Secondary | ICD-10-CM | POA: Diagnosis not present

## 2021-06-09 DIAGNOSIS — I429 Cardiomyopathy, unspecified: Secondary | ICD-10-CM | POA: Diagnosis not present

## 2021-06-09 DIAGNOSIS — M159 Polyosteoarthritis, unspecified: Secondary | ICD-10-CM | POA: Diagnosis not present

## 2021-06-09 DIAGNOSIS — G4733 Obstructive sleep apnea (adult) (pediatric): Secondary | ICD-10-CM | POA: Diagnosis not present

## 2021-06-09 DIAGNOSIS — I110 Hypertensive heart disease with heart failure: Secondary | ICD-10-CM | POA: Diagnosis not present

## 2021-06-09 DIAGNOSIS — D751 Secondary polycythemia: Secondary | ICD-10-CM | POA: Diagnosis not present

## 2021-06-09 DIAGNOSIS — K219 Gastro-esophageal reflux disease without esophagitis: Secondary | ICD-10-CM | POA: Diagnosis not present

## 2021-06-09 DIAGNOSIS — Z7901 Long term (current) use of anticoagulants: Secondary | ICD-10-CM | POA: Diagnosis not present

## 2021-06-09 DIAGNOSIS — I5032 Chronic diastolic (congestive) heart failure: Secondary | ICD-10-CM | POA: Diagnosis not present

## 2021-06-09 DIAGNOSIS — F32A Depression, unspecified: Secondary | ICD-10-CM | POA: Diagnosis not present

## 2021-06-09 DIAGNOSIS — I7300 Raynaud's syndrome without gangrene: Secondary | ICD-10-CM | POA: Diagnosis not present

## 2021-06-09 DIAGNOSIS — H539 Unspecified visual disturbance: Secondary | ICD-10-CM | POA: Diagnosis not present

## 2021-06-09 DIAGNOSIS — Z86718 Personal history of other venous thrombosis and embolism: Secondary | ICD-10-CM | POA: Diagnosis not present

## 2021-06-09 DIAGNOSIS — G8929 Other chronic pain: Secondary | ICD-10-CM | POA: Diagnosis not present

## 2021-06-09 DIAGNOSIS — Z86711 Personal history of pulmonary embolism: Secondary | ICD-10-CM | POA: Diagnosis not present

## 2021-06-09 DIAGNOSIS — G43909 Migraine, unspecified, not intractable, without status migrainosus: Secondary | ICD-10-CM | POA: Diagnosis not present

## 2021-06-09 DIAGNOSIS — M810 Age-related osteoporosis without current pathological fracture: Secondary | ICD-10-CM | POA: Diagnosis not present

## 2021-06-09 DIAGNOSIS — K449 Diaphragmatic hernia without obstruction or gangrene: Secondary | ICD-10-CM | POA: Diagnosis not present

## 2021-06-10 IMAGING — MG DIGITAL SCREENING BILATERAL MAMMOGRAM WITH TOMO AND CAD
8 series · 8 of 24 positions shown · non-contrast
Comparison: Previous exam(s).

CLINICAL DATA: Screening. History of LEFT breast cancer and
lumpectomy in 2229.

EXAM:
DIGITAL SCREENING BILATERAL MAMMOGRAM WITH TOMO AND CAD

[L CC synth-2D]
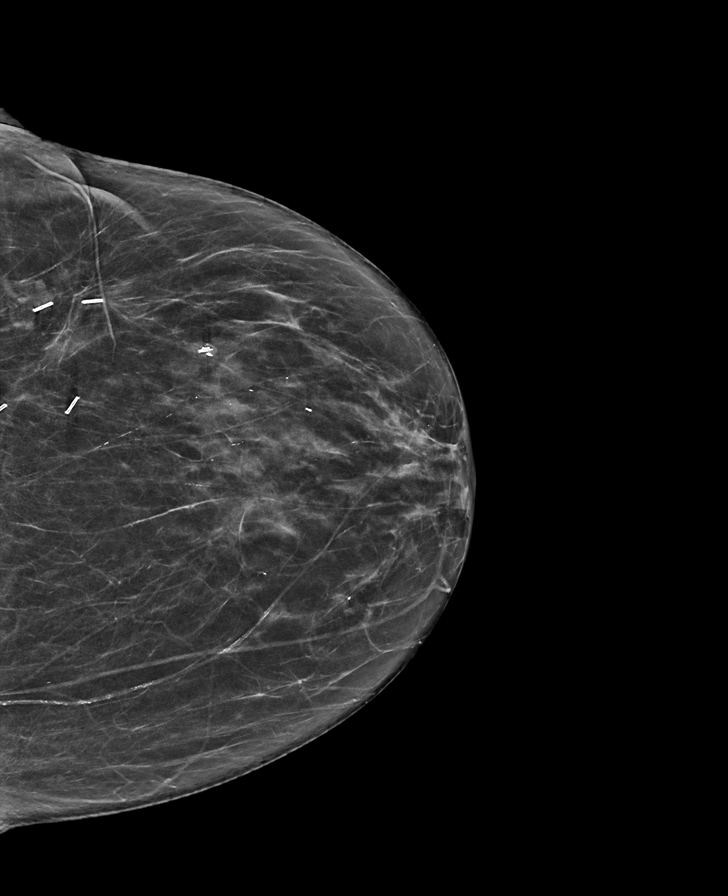

[L MLO synth-2D]
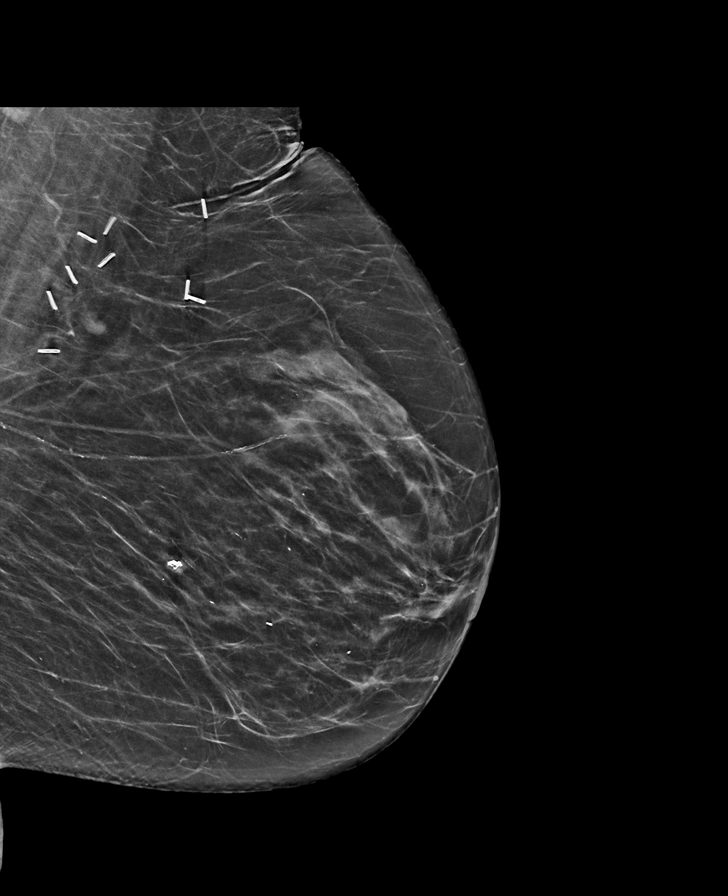

[R CC synth-2D]
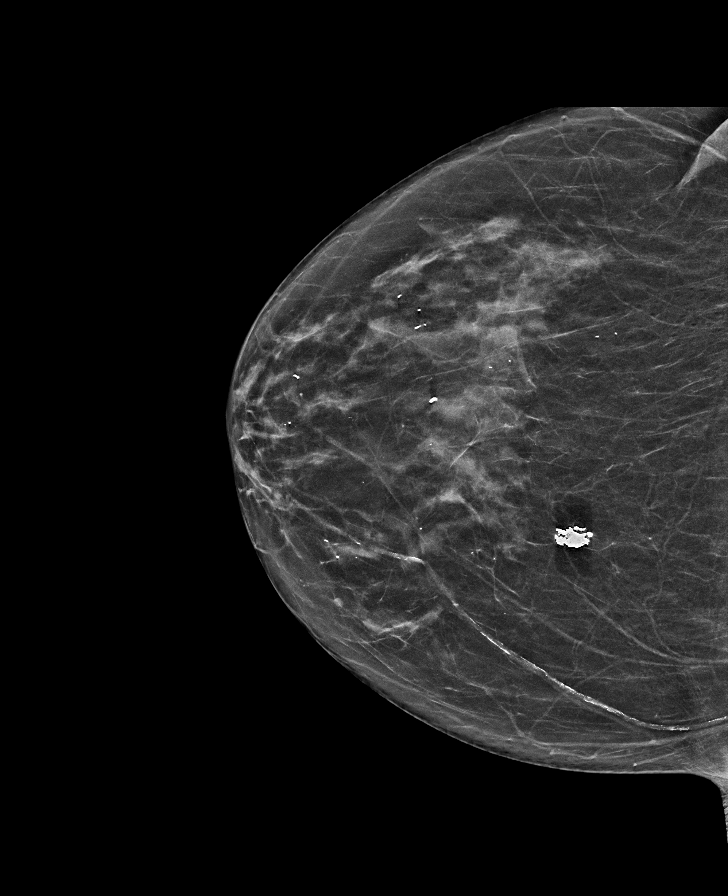

[R MLO synth-2D]
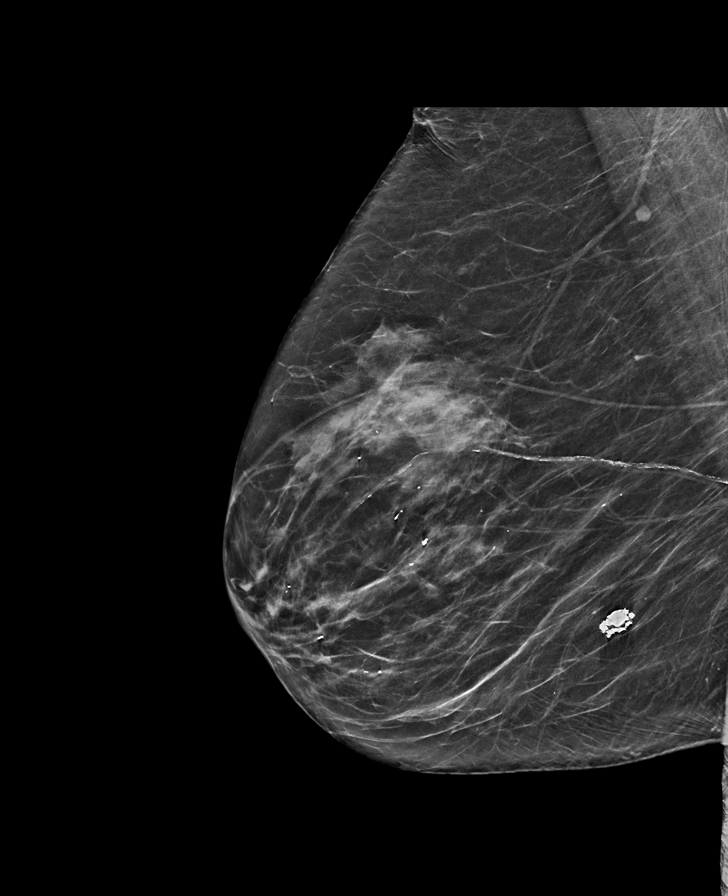

[R CC tomo · tomo slice 30/59.0]
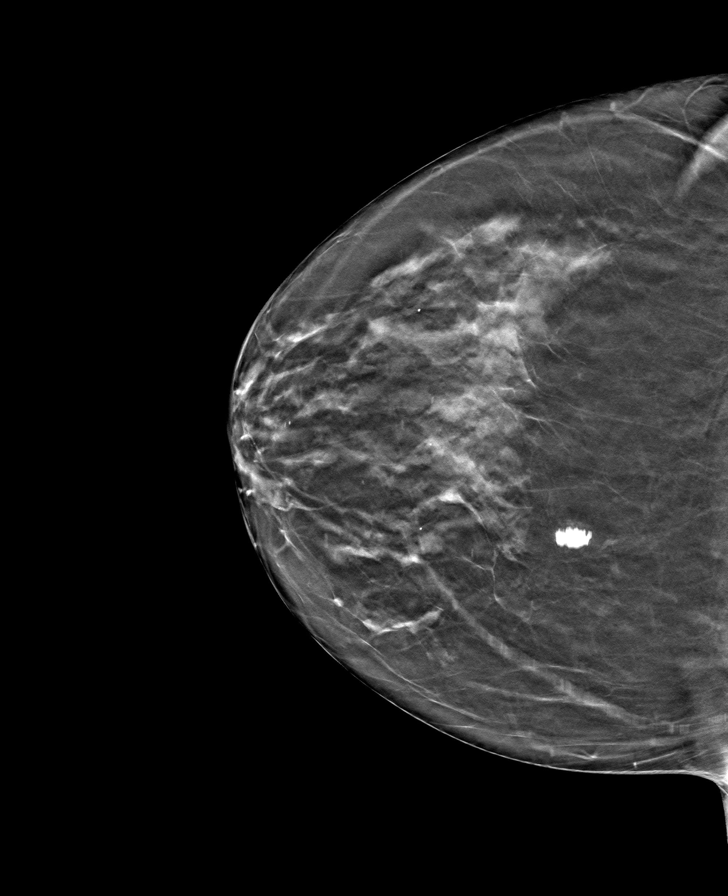

[L MLO tomo · tomo slice 31/62.0]
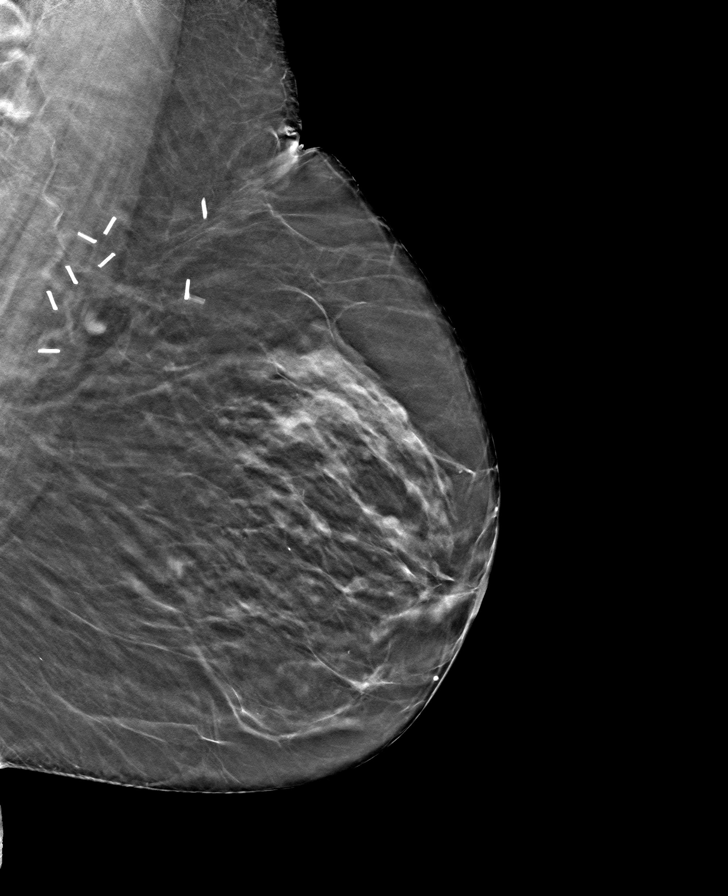

[R MLO tomo · tomo slice 33/64.0]
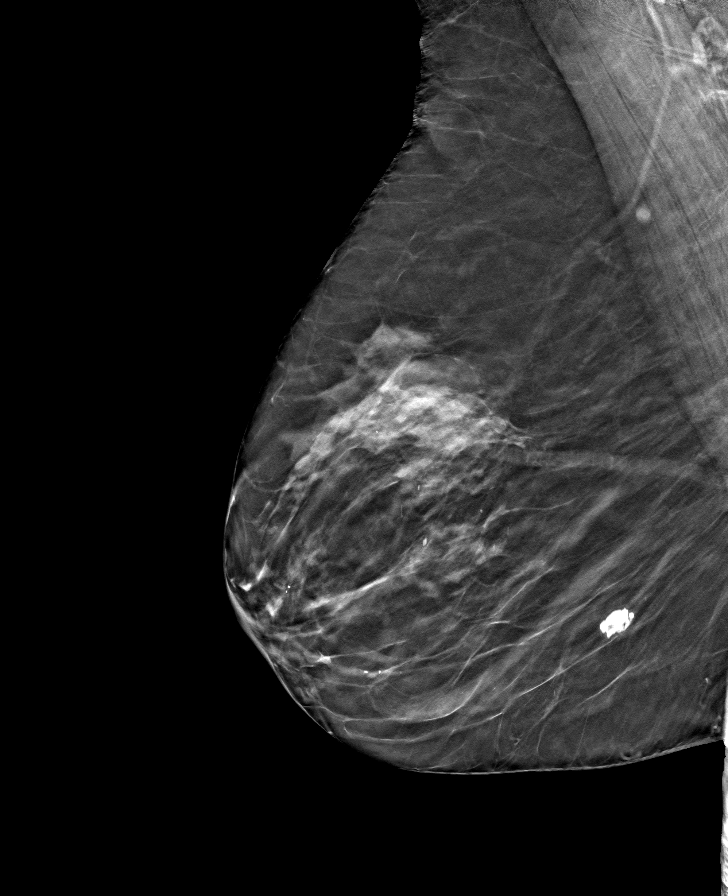

[L CC tomo · tomo slice 32/63.0]
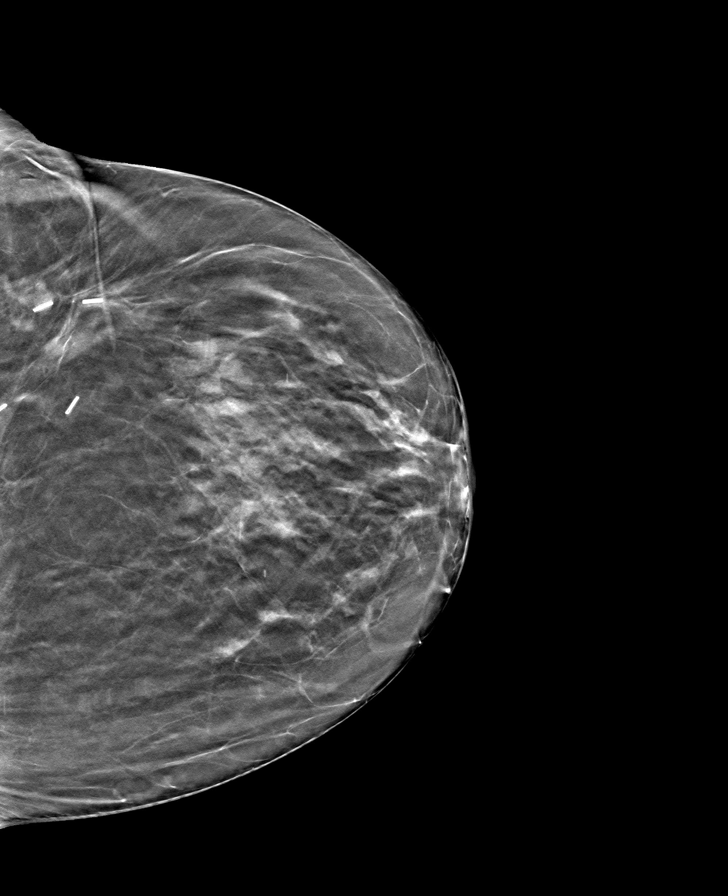

[8 of 24 positions shown; findings below may reference images not displayed]

ACR Breast Density Category b: There are scattered areas of
fibroglandular density.
FINDINGS: There are no findings suspicious for malignancy.

LEFT lumpectomy changes again noted.

Images were processed with CAD.
IMPRESSION: No mammographic evidence of malignancy. A result letter of this
screening mammogram will be mailed directly to the patient.

RECOMMENDATION:
Screening mammogram in one year. (Code:XP-6-BSF)

BI-RADS CATEGORY  2: Benign.

## 2021-06-13 ENCOUNTER — Encounter: Payer: Self-pay | Admitting: Internal Medicine

## 2021-06-13 ENCOUNTER — Ambulatory Visit (INDEPENDENT_AMBULATORY_CARE_PROVIDER_SITE_OTHER): Payer: Medicare Other | Admitting: Internal Medicine

## 2021-06-13 ENCOUNTER — Inpatient Hospital Stay: Payer: Medicare Other | Attending: Hematology & Oncology | Admitting: Hematology & Oncology

## 2021-06-13 ENCOUNTER — Encounter: Payer: Self-pay | Admitting: Hematology & Oncology

## 2021-06-13 ENCOUNTER — Inpatient Hospital Stay: Payer: Medicare Other

## 2021-06-13 ENCOUNTER — Other Ambulatory Visit (HOSPITAL_BASED_OUTPATIENT_CLINIC_OR_DEPARTMENT_OTHER): Payer: Self-pay

## 2021-06-13 VITALS — BP 132/76 | HR 67 | Temp 97.6°F | Resp 18 | Ht 66.0 in | Wt 113.4 lb

## 2021-06-13 DIAGNOSIS — L723 Sebaceous cyst: Secondary | ICD-10-CM | POA: Diagnosis not present

## 2021-06-13 DIAGNOSIS — I5032 Chronic diastolic (congestive) heart failure: Secondary | ICD-10-CM | POA: Diagnosis not present

## 2021-06-13 DIAGNOSIS — L089 Local infection of the skin and subcutaneous tissue, unspecified: Secondary | ICD-10-CM

## 2021-06-13 MED ORDER — DOXYCYCLINE HYCLATE 100 MG PO TABS
100.0000 mg | ORAL_TABLET | Freq: Two times a day (BID) | ORAL | 0 refills | Status: DC
  Filled 2021-06-13: qty 10, 5d supply, fill #0

## 2021-06-13 MED ORDER — BETAMETHASONE DIPROPIONATE 0.05 % EX CREA
TOPICAL_CREAM | Freq: Two times a day (BID) | CUTANEOUS | 1 refills | Status: DC
  Filled 2021-06-13: qty 30, 15d supply, fill #0

## 2021-06-13 NOTE — Assessment & Plan Note (Signed)
Sebaceous cyst, infected?: Many years history of a lump at the left deltoid area, only few days ago started to swell up and bleed, I noted opening there today (initially it actually looked like ulcer). See procedure note, fatty material removed. Culture sent. Plan:  Doxycycline, reassess in 3 weeks to be sure the opening is healing well and there is no residual skin lesion or ulcer.  Right ankle pain: There was a initial reason for the appointment however that is resolved.  Ankle is normal to inspection and palpation. Cardiomyopathy: Reports she self stopped Lasix few days ago due to "easy bruising".  Advised to go back on it Procedure note: After obtaining verbal consent, and cleaning the area with peroxide, I easily remove about 2 cc of fatty material with soft pressure. The patient c/o pain, I injected lidocaine 2% without about 1 mL and continue obtaining  fatty material. Area was cleaned with peroxide. No discharge noted. The area bled, it stopped with pressure. See picture . RTC 3 weeks

## 2021-06-13 NOTE — Patient Instructions (Signed)
Keep the area clean and dry.  Change the gauze daily.  Take an antibiotic called doxycycline x5 days  If you start bleeding, apply pressure.  If the bleeding does not stop within minutes, please call the office.  Restart furosemide     GO TO THE FRONT DESK, PLEASE SCHEDULE YOUR APPOINTMENTS Come back for a checkup of the wound in 3 weeks

## 2021-06-13 NOTE — Progress Notes (Signed)
Subjective:    Patient ID: [name], female    DOB: 03-Jan-1930, 88 y.o.   MRN: [id number]  DOS:  06/13/2021 Type of visit - description: acute  Here with 2 problems: About 2 weeks ago developed pain at the right ankle, that is essentially resolved.  Also she has long history (years) of lump on the left deltoid area. About a week ago the area started to swell ; developed some pain. Denies any recent vaccination in that area. She did notice some bleed from there.     Review of Systems See above   Past Medical History:  Diagnosis Date   CAD (coronary artery disease)    mild nonobstructive disease on cath in 2003   Cancer [facility])    Cardiomyopathy    Probable Takotsubo, severe CP w/ normal cath in 1994. Severe CP in 2003 w/ widespread T wave inversions on ECG. Cath w/ minimal coronary disease but LV-gram showed periapical severe hypokinesis and basilar hyperkinesia (EF 40%). Last echo in 4/09 confirmed full LV functional recovery with EF 60%, no regional wall motion abnormalities, mild to moderate LVH.   Chronic diastolic CHF (congestive heart failure) ([place]) 09/13/2019   CVA (cerebral infarction)    Depression with anxiety 03/29/2011   lost husband 3'09   DVT (deep venous thrombosis) ([place])    after venous ablation, R leg   Fracture 09/10/2007   L2, status post vertebroplasty of L2 performed by IR   GERD (gastroesophageal reflux disease)    Hemorrhoids    Hiatal hernia 03/29/2011   no nerve problems   Hyperlipidemia    Hyperplastic colon polyp 06/2001   Hypertension    Iron deficiency anemia due to chronic blood loss 12/30/2017   OA (osteoarthritis) of knee 03/29/2011   w/ bilateral knee pain-not a problem now   OSA (obstructive sleep apnea) 03/29/2011   no cpap used, not a problem now.   Osteoporosis    Otalgia of both ears    Dr. Simeon Craft   Polycythemia    Stroke [facility]) 03/29/2011   CVA x2 -last 10'12-?TIA(visual problems)   Varicose vein     Past Surgical  History:  Procedure Laterality Date   APPENDECTOMY     BACK SURGERY     BREAST BIOPSY     BREAST CYST EXCISION     BREAST LUMPECTOMY Left 2013   left stage I left breast cancer   CHOLECYSTECTOMY  04/09/2011   Procedure: LAPAROSCOPIC CHOLECYSTECTOMY WITH INTRAOPERATIVE CHOLANGIOGRAM;  Surgeon: Odis Hollingshead, MD;  Location: [facility];  Service: General;  Laterality: N/A;   Dental Extraction     L maxillary molar   INTRAMEDULLARY (IM) NAIL INTERTROCHANTERIC Left 12/25/2019   Procedure: INTRAMEDULLARY (IM) NAIL INTERTROCHANTRIC;  Surgeon: Paralee Cancel, MD;  Location: [facility];  Service: Orthopedics;  Laterality: Left;   IR KYPHO THORACIC WITH BONE BIOPSY  09/15/2019   IR KYPHO THORACIC WITH BONE BIOPSY  07/13/2020       IR KYPHO THORACIC WITH BONE BIOPSY  07/13/2020   IR VERTEBROPLASTY CERV/THOR BX INC UNI/BIL INC/INJECT/IMAGING  11/10/2019   KYPHOPLASTY N/A 11/16/2020   Procedure: T11,  L1,KYPHOPLASTY;  Surgeon: Melina Schools, MD;  Location: [place];  Service: Orthopedics;  Laterality: N/A;   KYPHOSIS SURGERY  08/2007   cement used   OVARIAN CYST SURGERY     left   RADIOLOGY WITH ANESTHESIA N/A 11/10/2019   Procedure: VERTEBROPLASTY;  Surgeon: Luanne Bras, MD;  Location: [place];  Service: Radiology;  Laterality: N/A;  TONSILLECTOMY     TOTAL KNEE ARTHROPLASTY  06/2010   left   TUBAL LIGATION      Current Outpatient Medications  Medication Instructions   acetaminophen (TYLENOL) 500 mg, Oral, Every 6 hours PRN   alendronate (FOSAMAX) 70 mg, Oral, Every 7 days, Take with a full glass of water on an empty stomach.   apixaban (ELIQUIS) 5 mg, Oral, 2 times daily   betamethasone dipropionate 0.05 % cream Topical, 2 times daily   furosemide (LASIX) 20 mg, Oral, Every other day   HYDROcodone-acetaminophen (NORCO/VICODIN) 5-325 MG tablet 1 tablet, Oral, Daily PRN   lactose free nutrition (BOOST) LIQD 237 mLs, Oral, Daily   LORazepam (ATIVAN) 0.5 MG tablet TAKE 1 TABLET(0.5 MG) BY MOUTH TWICE  DAILY AS NEEDED FOR ANXIETY   metoprolol tartrate (LOPRESSOR) 50 mg, Oral, 2 times daily   nitroGLYCERIN (NITROSTAT) 0.4 mg, Sublingual, Every 5 min x3 PRN   omeprazole (PRILOSEC) 20 mg, Oral, Daily   pantoprazole (PROTONIX) 40 mg, Oral, Daily   valACYclovir (VALTREX) 500 MG tablet TAKE 2 TABLETS BY MOUTH AT ONSET OF RASH AND 2 TABLETS 12 HOURS LATER AS NEEDED       Objective:   Physical Exam BP 132/76 (BP Location: Right Arm, Patient Position: Sitting, Cuff Size: Small)    Pulse 67    Temp 97.6 F (36.4 C) (Axillary)    Resp 18    Ht 5\' 6"  (1.676 m)    Wt 113 lb 6 oz (51.4 kg)    SpO2 96%    BMI 18.30 kg/m  General:   Well developed, NAD, BMI noted. HEENT:  Normocephalic . Face symmetric, atraumatic L shoulder: Has a 2 x 1 cm lump with a opening, . Lower extremities: no pretibial edema bilaterally.  R ankle normal Skin: Not pale. Not jaundice Neurologic:  alert & oriented X3.  Speech normal, gait appropriate for age and unassisted Psych--  Cognition and judgment appear intact.  Cooperative with normal attention span and concentration.  Behavior appropriate. No anxious or depressed appearing.       Assessment     Assessment HTN Dyslipidemia: Intolerant to most medicines including Zetia, Pravachol, Niaspan Depression, anxiety , insomnia - on ativan bid (03-2015 citalopram cause drowsiness, 04-2015 prozac:diarrhea) Hem-Onc: POLYCYTEMIA: sees hem-onc H/o breast ca MSK: ---DJD  ---Osteoporosis:  T score -2.8 (02-2013): declined treatment 06-2017 ---L2 fractures, s/p vertebral plasty 2009, s/p vertebral plasty T 8 07/2020 Migraine headaches - Tylenol #3 (rarely) GI: GERD- intolerant to prevacid, visual disturbances HH, diverticulitis, Colon polyps h/o "functional " LLQ abd pain CV: ---Cardiomyopathy Probable Takotsubo cardiomyopathy: CP w/ normal cath in 1994.   Severe CP  2003 with widespread T wave inversions on ECG.  Cath with minimal coronary disease but LV-gram  showed periapical severe hypokinesis and basilar hyperkinesia (EF 40%) >>> ECHO 4/09 confirmed full LV functional recovery with EF 60%, mild LVH. 2012: Nl EF per echo, (-) Lexiscan 05/21/18 ECHO Nl/stable ---Stroke / TIA 2012 ---ICB seizure 10/2019 was rx keppra, self d/c 05/2019 Sleep apnea 2012, , not on CPAP Hematology: ---H/o DVT, right leg after venous ablation --- Pulmonary emboli  dx 12-25-20 Raynaud phenomena, DX 05/2018   PLAN Sebaceous cyst, infected?: Many years history of a lump at the left deltoid area, only few days ago started to swell up and bleed, I noted opening there today (initially it actually looked like ulcer). See procedure note, fatty material removed. Culture sent. Plan:  Doxycycline, reassess in 3 weeks to be sure the  opening is healing well and there is no residual skin lesion or ulcer.  Right ankle pain: There was a initial reason for the appointment however that is resolved.  Ankle is normal to inspection and palpation. Cardiomyopathy: Reports she self stopped Lasix few days ago due to "easy bruising".  Advised to go back on it Procedure note: After obtaining verbal consent, and cleaning the area with peroxide, I easily remove about 2 cc of fatty material with soft pressure. The patient c/o pain, I injected lidocaine 2% without about 1 mL and continue obtaining  fatty material. Area was cleaned with peroxide. No discharge noted. The area bled, it stopped with pressure. See picture . RTC 3 weeks    This visit occurred during the SARS-CoV-2 public health emergency.  Safety protocols were in place, including screening questions prior to the visit, additional usage of staff PPE, and extensive cleaning of exam room while observing appropriate contact time as indicated for disinfecting solutions.

## 2021-06-16 DIAGNOSIS — I110 Hypertensive heart disease with heart failure: Secondary | ICD-10-CM | POA: Diagnosis not present

## 2021-06-16 DIAGNOSIS — F32A Depression, unspecified: Secondary | ICD-10-CM | POA: Diagnosis not present

## 2021-06-16 DIAGNOSIS — I69398 Other sequelae of cerebral infarction: Secondary | ICD-10-CM | POA: Diagnosis not present

## 2021-06-16 DIAGNOSIS — I5032 Chronic diastolic (congestive) heart failure: Secondary | ICD-10-CM | POA: Diagnosis not present

## 2021-06-16 DIAGNOSIS — Z86718 Personal history of other venous thrombosis and embolism: Secondary | ICD-10-CM | POA: Diagnosis not present

## 2021-06-16 DIAGNOSIS — Z9181 History of falling: Secondary | ICD-10-CM | POA: Diagnosis not present

## 2021-06-16 DIAGNOSIS — G8929 Other chronic pain: Secondary | ICD-10-CM | POA: Diagnosis not present

## 2021-06-16 DIAGNOSIS — K219 Gastro-esophageal reflux disease without esophagitis: Secondary | ICD-10-CM | POA: Diagnosis not present

## 2021-06-16 DIAGNOSIS — R296 Repeated falls: Secondary | ICD-10-CM | POA: Diagnosis not present

## 2021-06-16 DIAGNOSIS — I7300 Raynaud's syndrome without gangrene: Secondary | ICD-10-CM | POA: Diagnosis not present

## 2021-06-16 DIAGNOSIS — I429 Cardiomyopathy, unspecified: Secondary | ICD-10-CM | POA: Diagnosis not present

## 2021-06-16 DIAGNOSIS — Z86711 Personal history of pulmonary embolism: Secondary | ICD-10-CM | POA: Diagnosis not present

## 2021-06-16 DIAGNOSIS — G4733 Obstructive sleep apnea (adult) (pediatric): Secondary | ICD-10-CM | POA: Diagnosis not present

## 2021-06-16 DIAGNOSIS — D751 Secondary polycythemia: Secondary | ICD-10-CM | POA: Diagnosis not present

## 2021-06-16 DIAGNOSIS — W19XXXD Unspecified fall, subsequent encounter: Secondary | ICD-10-CM | POA: Diagnosis not present

## 2021-06-16 DIAGNOSIS — M810 Age-related osteoporosis without current pathological fracture: Secondary | ICD-10-CM | POA: Diagnosis not present

## 2021-06-16 DIAGNOSIS — K449 Diaphragmatic hernia without obstruction or gangrene: Secondary | ICD-10-CM | POA: Diagnosis not present

## 2021-06-16 DIAGNOSIS — H539 Unspecified visual disturbance: Secondary | ICD-10-CM | POA: Diagnosis not present

## 2021-06-16 DIAGNOSIS — Z79891 Long term (current) use of opiate analgesic: Secondary | ICD-10-CM | POA: Diagnosis not present

## 2021-06-16 DIAGNOSIS — E785 Hyperlipidemia, unspecified: Secondary | ICD-10-CM | POA: Diagnosis not present

## 2021-06-16 DIAGNOSIS — M159 Polyosteoarthritis, unspecified: Secondary | ICD-10-CM | POA: Diagnosis not present

## 2021-06-16 DIAGNOSIS — Z7901 Long term (current) use of anticoagulants: Secondary | ICD-10-CM | POA: Diagnosis not present

## 2021-06-16 DIAGNOSIS — G43909 Migraine, unspecified, not intractable, without status migrainosus: Secondary | ICD-10-CM | POA: Diagnosis not present

## 2021-06-16 DIAGNOSIS — I2510 Atherosclerotic heart disease of native coronary artery without angina pectoris: Secondary | ICD-10-CM | POA: Diagnosis not present

## 2021-06-16 LAB — WOUND CULTURE
MICRO NUMBER:: 12943413
SPECIMEN QUALITY:: ADEQUATE

## 2021-06-19 ENCOUNTER — Telehealth: Payer: Self-pay | Admitting: Internal Medicine

## 2021-06-19 NOTE — Telephone Encounter (Signed)
Patient states she is not sure if she needs to continue her eliquis or not. Please advise.

## 2021-06-19 NOTE — Telephone Encounter (Signed)
I reviewed the issue during the office visit 05/22/2021.  The plan is:  Once she finish her supply of Eliquis, she can stop it and start a baby aspirin.   Please let the patient know

## 2021-06-19 NOTE — Telephone Encounter (Signed)
Spoke w/ Pt- informed of PCP recommendations. She verbalized understanding. She informed that she has a scratch on her leg that keeps bleeding- informed to hold pressure for several minutes- let us know if it continues to bleed. Pt verbalized understanding.

## 2021-06-19 NOTE — Telephone Encounter (Signed)
Please advise 

## 2021-06-26 ENCOUNTER — Other Ambulatory Visit (HOSPITAL_COMMUNITY): Payer: Self-pay

## 2021-06-28 ENCOUNTER — Ambulatory Visit: Payer: PRIVATE HEALTH INSURANCE | Admitting: Internal Medicine

## 2021-07-04 ENCOUNTER — Ambulatory Visit (INDEPENDENT_AMBULATORY_CARE_PROVIDER_SITE_OTHER): Payer: Medicare Other | Admitting: Internal Medicine

## 2021-07-04 ENCOUNTER — Encounter: Payer: Self-pay | Admitting: Internal Medicine

## 2021-07-04 VITALS — BP 120/82 | HR 70 | Temp 97.8°F | Resp 18 | Ht 66.0 in | Wt 115.5 lb

## 2021-07-04 DIAGNOSIS — L723 Sebaceous cyst: Secondary | ICD-10-CM | POA: Diagnosis not present

## 2021-07-04 DIAGNOSIS — L089 Local infection of the skin and subcutaneous tissue, unspecified: Secondary | ICD-10-CM | POA: Diagnosis not present

## 2021-07-04 DIAGNOSIS — R233 Spontaneous ecchymoses: Secondary | ICD-10-CM | POA: Diagnosis not present

## 2021-07-04 DIAGNOSIS — Z9189 Other specified personal risk factors, not elsewhere classified: Secondary | ICD-10-CM

## 2021-07-04 DIAGNOSIS — I10 Essential (primary) hypertension: Secondary | ICD-10-CM

## 2021-07-04 NOTE — Progress Notes (Signed)
Subjective:    Patient ID: [name], female    DOB: [birth year], 88 y.o.   MRN: [id number]  DOS:  07/04/2021 Type of visit - description: acute  Her main concern today is easy bruising at the lower extremities. Does not recall any injury. She is now off Eliquis and taking aspirin.  Denies any nosebleeds. No blood per rectum   No hemoptysis or gross hematuria. Occasionally has headaches, sharp pains on and off for years.  Review of Systems See above   Past Medical History:  Diagnosis Date   CAD (coronary artery disease)    mild nonobstructive disease on cath in 2003   Cancer [facility])    Cardiomyopathy    Probable Takotsubo, severe CP w/ normal cath in 1994. Severe CP in 2003 w/ widespread T wave inversions on ECG. Cath w/ minimal coronary disease but LV-gram showed periapical severe hypokinesis and basilar hyperkinesia (EF 40%). Last echo in 4/09 confirmed full LV functional recovery with EF 60%, no regional wall motion abnormalities, mild to moderate LVH.   Chronic diastolic CHF (congestive heart failure) ([place]) 09/13/2019   CVA (cerebral infarction)    Depression with anxiety 03/29/2011   lost husband 3'09   DVT (deep venous thrombosis) ([place])    after venous ablation, R leg   Fracture 09/10/2007   L2, status post vertebroplasty of L2 performed by IR   GERD (gastroesophageal reflux disease)    Hemorrhoids    Hiatal hernia 03/29/2011   no nerve problems   Hyperlipidemia    Hyperplastic colon polyp 06/2001   Hypertension    Iron deficiency anemia due to chronic blood loss 12/30/2017   OA (osteoarthritis) of knee 03/29/2011   w/ bilateral knee pain-not a problem now   OSA (obstructive sleep apnea) 03/29/2011   no cpap used, not a problem now.   Osteoporosis    Otalgia of both ears    Dr. Simeon Craft   Polycythemia    Stroke [facility]) 03/29/2011   CVA x2 -last 10'12-?TIA(visual problems)   Varicose vein     Past Surgical History:  Procedure Laterality Date    APPENDECTOMY     BACK SURGERY     BREAST BIOPSY     BREAST CYST EXCISION     BREAST LUMPECTOMY Left 2013   left stage I left breast cancer   CHOLECYSTECTOMY  04/09/2011   Procedure: LAPAROSCOPIC CHOLECYSTECTOMY WITH INTRAOPERATIVE CHOLANGIOGRAM;  Surgeon: Odis Hollingshead, MD;  [facility];  Service: General;  Laterality: N/A;   Dental Extraction     L maxillary molar   INTRAMEDULLARY (IM) NAIL INTERTROCHANTERIC Left 12/25/2019   Procedure: INTRAMEDULLARY (IM) NAIL INTERTROCHANTRIC;  Surgeon: Paralee Cancel, MD;  [facility];  Service: Orthopedics;  Laterality: Left;   IR KYPHO THORACIC WITH BONE BIOPSY  09/15/2019   IR KYPHO THORACIC WITH BONE BIOPSY  07/13/2020       IR KYPHO THORACIC WITH BONE BIOPSY  07/13/2020   IR VERTEBROPLASTY CERV/THOR BX INC UNI/BIL INC/INJECT/IMAGING  11/10/2019   KYPHOPLASTY N/A 11/16/2020   Procedure: T11,  L1,KYPHOPLASTY;  Surgeon: Melina Schools, MD;  Location: [place];  Service: Orthopedics;  Laterality: N/A;   KYPHOSIS SURGERY  08/2007   cement used   OVARIAN CYST SURGERY     left   RADIOLOGY WITH ANESTHESIA N/A 11/10/2019   Procedure: VERTEBROPLASTY;  Surgeon: Luanne Bras, MD;  Location: [place];  Service: Radiology;  Laterality: N/A;   TONSILLECTOMY     TOTAL KNEE ARTHROPLASTY  06/2010  left   TUBAL LIGATION      Current Outpatient Medications  Medication Instructions   acetaminophen (TYLENOL) 500 mg, Oral, Every 6 hours PRN   alendronate (FOSAMAX) 70 mg, Oral, Every 7 days, Take with a full glass of water on an empty stomach.   aspirin EC 81 mg, Oral, Daily, Swallow whole.   betamethasone dipropionate 0.05 % cream Apply on to the skin 2 (two) times daily.   furosemide (LASIX) 20 mg, Oral, Every other day   HYDROcodone-acetaminophen (NORCO/VICODIN) 5-325 MG tablet 1 tablet, Oral, Daily PRN   lactose free nutrition (BOOST) LIQD 237 mLs, Oral, Daily   LORazepam (ATIVAN) 0.5 MG tablet TAKE 1 TABLET(0.5 MG) BY MOUTH TWICE DAILY AS NEEDED FOR  ANXIETY   metoprolol tartrate (LOPRESSOR) 50 mg, Oral, 2 times daily   nitroGLYCERIN (NITROSTAT) 0.4 mg, Sublingual, Every 5 min x3 PRN   omeprazole (PRILOSEC) 20 mg, Oral, Daily   pantoprazole (PROTONIX) 40 mg, Oral, Daily   valACYclovir (VALTREX) 500 MG tablet TAKE 2 TABLETS BY MOUTH AT ONSET OF RASH AND 2 TABLETS 12 HOURS LATER AS NEEDED       Objective:   Physical Exam BP 120/82 (BP Location: Left Arm, Patient Position: Sitting, Cuff Size: Small)    Pulse 70    Temp 97.8 F (36.6 C) (Oral)    Resp 18    Ht 5\' 6"  (1.676 m)    Wt 115 lb 8 oz (52.4 kg)    SpO2 93%    BMI 18.64 kg/m  General:   Well developed, NAD, BMI noted. HEENT:  Normocephalic . Face symmetric, atraumatic Lungs:  CTA B Normal respiratory effort, no intercostal retractions, no accessory muscle use. Heart: RRR,  no murmur.  Lower extremities: no pretibial edema bilaterally  Skin: Has few bruises below the knee, no more than 4 x 3 cm. There is 1 area has excoriation at the left pretibial area with no superficial skin (happening when she pulled a Band-Aid too quickly).  No redness, no discharge Left arm: Area of previously seen cyst seems to be healing well. Neurologic:  alert & oriented X3.  Speech normal, gait at baseline  Psych--  Cognition and judgment appear intact.  Cooperative with normal attention span and concentration.  Behavior appropriate. No anxious or depressed appearing.      Assessment       Assessment HTN Dyslipidemia: Intolerant to most medicines including Zetia, Pravachol, Niaspan Depression, anxiety , insomnia - on ativan bid (03-2015 citalopram cause drowsiness, 04-2015 prozac:diarrhea) Hem-Onc: POLYCYTEMIA: sees hem-onc H/o breast ca MSK: ---DJD  ---Osteoporosis:  T score -2.8 (02-2013): declined treatment 06-2017 ---L2 fractures, s/p vertebral plasty 2009, s/p vertebral plasty T 8 07/2020 Migraine headaches - Tylenol #3 (rarely) GI: GERD- intolerant to prevacid, visual  disturbances HH, diverticulitis, Colon polyps h/o "functional " LLQ abd pain CV: ---Cardiomyopathy Probable Takotsubo cardiomyopathy: CP w/ normal cath in 1994.   Severe CP  2003 with widespread T wave inversions on ECG.  Cath with minimal coronary disease but LV-gram showed periapical severe hypokinesis and basilar hyperkinesia (EF 40%) >>> ECHO 4/09 confirmed full LV functional recovery with EF 60%, mild LVH. 2012: Nl EF per echo, (-) Lexiscan 05/21/18 ECHO Nl/stable ---Stroke / TIA 2012 ---ICB seizure 10/2019 was rx keppra, self d/c 05/2019 Sleep apnea 2012, , not on CPAP Hematology: ---H/o DVT, right leg after venous ablation --- Pulmonary emboli  dx 12-25-20 Raynaud phenomena, DX 05/2018   PLAN Easy bruising: With no evidence of major bleeding, see  review of systems.  Recommend observation Currently off Eliquis mostly due to cost, on aspirin 81 mg. Med compliance: This is a concern, strongly recommend to go over her medication list.  Reports that she has friends that are helping her organizing her medicines. Sebaceous cyst, L arm: Area seems to be healing well    This visit occurred during the SARS-CoV-2 public health emergency.  Safety protocols were in place, including screening questions prior to the visit, additional usage of staff PPE, and extensive cleaning of exam room while observing appropriate contact time as indicated for disinfecting solutions.

## 2021-07-04 NOTE — Patient Instructions (Addendum)
Today we are providing you with a medication list.  When you go home, please be sure you are taking the medications as recommended.  If you need a refill please let us know

## 2021-07-05 ENCOUNTER — Inpatient Hospital Stay: Payer: Medicare Other | Attending: Hematology & Oncology

## 2021-07-05 ENCOUNTER — Telehealth: Payer: Self-pay | Admitting: *Deleted

## 2021-07-05 ENCOUNTER — Inpatient Hospital Stay: Payer: Medicare Other | Admitting: Hematology & Oncology

## 2021-07-05 NOTE — Telephone Encounter (Signed)
Patient stopped by at the end of day on 07/04/21 requesting a follow up appointment with Dr. Marin Olp on 07/05/21. Patient confirmed however was a no-show on today 07/05/21. Called and was not able to lvm - mailbox is full.

## 2021-07-05 NOTE — Chronic Care Management (AMB) (Signed)
°  Chronic Care Management   Outreach Note  07/05/2021 Name: [name] MRN: [id number] DOB: 02-Apr-1930  [name] is a 88 y.o. year old female who is a primary care patient of Colon Branch, MD. I reached out to Dorris Carnes by phone today in response to a referral sent by Ms. [name]'s primary care provider.  An unsuccessful telephone outreach was attempted today. The patient was referred to the case management team for assistance with care management and care coordination.   Follow Up Plan: If patient returns call to provider office, please advise to call Embedded Care Management Care Guide Marybell Robards at [place], [place] Management  Direct Dial: [phone]

## 2021-07-06 NOTE — Assessment & Plan Note (Signed)
Easy bruising: With no evidence of major bleeding, see review of systems.  Recommend observation Currently off Eliquis mostly due to cost, on aspirin 81 mg. Med compliance: This is a concern, strongly recommend to go over her medication list.  Reports that she has friends that are helping her organizing her medicines. Sebaceous cyst, L arm: Area seems to be healing well

## 2021-07-10 NOTE — Chronic Care Management (AMB) (Signed)
°  Chronic Care Management   Outreach Note  07/10/2021 Name: [name] MRN: [id number] DOB: 1930-03-28  [name] is a 88 y.o. year old female who is a primary care patient of Colon Branch, MD. I reached out to Dorris Carnes by phone today in response to a referral sent by [name]'s primary care provider.  A second unsuccessful telephone outreach was attempted today. The patient was referred to the case management team for assistance with care management and care coordination.   Follow Up Plan: A HIPAA compliant phone message was left for the patient providing contact information and requesting a return call.  If patient returns call to provider office, please advise to call Embedded Care Management Care Guide Random Dobrowski at [place], [place] Management  Direct Dial: [phone]

## 2021-07-12 ENCOUNTER — Encounter: Payer: Self-pay | Admitting: Hematology & Oncology

## 2021-07-17 NOTE — Chronic Care Management (AMB) (Signed)
?  Chronic Care Management  ? ?Note ? ?07/17/2021 ?Name: ERA PARR MRN: [id number] DOB: [birth year] ? ?BURNADETTE BASKETT is a 88 y.o. year old female who is a primary care patient of Colon Branch, MD. I reached out to Dorris Carnes by phone today in response to a referral sent by Ms. Kathryne Hitch Lasser's PCP. ? ?Ms. Labell was given information about Chronic Care Management services today including:  ?CCM service includes personalized support from designated clinical staff supervised by her physician, including individualized plan of care and coordination with other care providers ?24/7 contact phone numbers for assistance for urgent and routine care needs. ?Service will only be billed when office clinical staff spend 20 minutes or more in a month to coordinate care. ?Only one practitioner may furnish and bill the service in a calendar month. ?The patient may stop CCM services at any time (effective at the end of the month) by phone call to the office staff. ?The patient is responsible for co-pay (up to 20% after annual deductible is met) if co-pay is required by the individual health plan.  ? ?Patient agreed to services and verbal consent obtained.  ? ?Follow up plan: ?Telephone appointment with care management team member scheduled for: 07/24/2021 ? ?Dwayne Begay, CCMA ?Care Guide, Embedded Care Coordination ?[place]  Care Management  ?Direct Dial: [phone] ? ? ?

## 2021-07-18 ENCOUNTER — Ambulatory Visit: Payer: PRIVATE HEALTH INSURANCE | Admitting: Internal Medicine

## 2021-07-20 ENCOUNTER — Telehealth: Payer: Self-pay | Admitting: Pharmacist

## 2021-07-20 NOTE — Chronic Care Management (AMB) (Signed)
? ? ?Chronic Care Management ?Pharmacy Assistant  ? ?Name: [name]  MRN: [id number] DOB: 07-14-1929 ? ?[name] is an 88 y.o. year old female who presents for his initial CCM visit with the clinical pharmacist. ? ?Recent office visits:  ?07/04/21-Jose Ladona Horns, MD (PCP) Seen for bruising at lower extremities.  ?06/13/21-Jose Ladona Horns, MD (PCP) Seen for ankle pain. Start on Doxycycline for 5 days. Restart furosemide. Follow up in 3 weeks. ?05/22/21-Jose Ladona Horns, MD (PCP) General follow up visit. Ambulatory referral to Neurology and Ambulatory referral to [facility]. Hydrocodone- acetaminophen 5-325 mg changed form 1 tab twice daily to 1 tab daily prn. Follow up in 4 months. ?02/13/21-Jose Ladona Horns, MD (PCP) General follow up visit. Follow up in 3 months. ?01/24/21-Melissa Inda Castle, NP. Seen for flank pain. Start on betamethasone cream. Chest xray ordered. Mammogram ordered. ? ?Recent consult visits:  ?05/10/21-Peter R. Marin Olp, MD (Oncology) Hematology and oncology follow up visit. Start on Aspirin. Follow up in 1 month. ?05/01/21-James Loreli Slot, MD (Ophthalmology) Initial consult. Follow up in 1 year. ?03/09/21-Brian Mora Bellman, MD (Cardiology) Seen for CHF. RESTART: Furosemide 20 mg take one tablet by mouth every other day. START: Protonix 40 mg take one tablet by mouth daily. Follow up in 6 months. ?02/15/21-Peter R. Marin Olp, MD (Oncology) Hematology and oncology follow up visit. Follow up in 2 months. ? ?Hospital visits:  ?Admitted to the hospital on 05/29/21 due to Ankle pain. Discharge date was 05/29/21. Discharged from [facility].   ? ?New?Medications Started at [facility] Discharge:?? ?-started None noted ? ?Medication Changes at Hospital Discharge: ?-Changed None noted ? ?Medications Discontinued at Hospital Discharge: ?-Stopped None noted ? ?Medications that remain the same after Hospital Discharge:??  ?-All other medications will remain the same.   ? ? ?Admitted to the  hospital on 05/02/21 due to Fall. Discharge date was 05/02/21. Discharged from [facility].   ? ?New?Medications Started at [facility] Discharge:?? ?-started None noted ? ?Medication Changes at Hospital Discharge: ?-Changed None noted ? ?Medications Discontinued at Hospital Discharge: ?-Stopped None noted ? ?Medications that remain the same after Hospital Discharge:??  ?-All other medications will remain the same.   ?Medications: ?Outpatient Encounter Medications as of 07/20/2021  ?Medication Sig Note  ? acetaminophen (TYLENOL) 500 MG tablet Take 500 mg by mouth every 6 (six) hours as needed for mild pain.   ? alendronate (FOSAMAX) 70 MG tablet Take 1 tablet (70 mg total) by mouth every 7 (seven) days. Take with a full glass of water on an empty stomach. 11/08/2020: Sunday  ? aspirin EC 81 MG tablet Take 81 mg by mouth daily. Swallow whole.   ? betamethasone dipropionate 0.05 % cream Apply on to the skin 2 (two) times daily.   ? furosemide (LASIX) 20 MG tablet Take 1 tablet (20 mg total) by mouth every other day. (Patient not taking: Reported on 07/04/2021) 07/04/2021: Not sure if she is taking  ? HYDROcodone-acetaminophen (NORCO/VICODIN) 5-325 MG tablet Take 1 tablet by mouth daily as needed for moderate pain.   ? lactose free nutrition (BOOST) LIQD Take 237 mLs by mouth daily.   ? LORazepam (ATIVAN) 0.5 MG tablet TAKE 1 TABLET(0.5 MG) BY MOUTH TWICE DAILY AS NEEDED FOR ANXIETY   ? metoprolol tartrate (LOPRESSOR) 50 MG tablet Take 1 tablet (50 mg total) by mouth 2 (two) times daily.   ? nitroGLYCERIN (NITROSTAT) 0.4 MG SL tablet Place 1 tablet (0.4 mg total) under the tongue every 5 (five)  minutes x 3 doses as needed for chest pain. (Patient not taking: Reported on 06/13/2021) 10/26/2020: PRN  ? omeprazole (PRILOSEC) 20 MG capsule Take 1 capsule (20 mg total) by mouth daily.   ? pantoprazole (PROTONIX) 40 MG tablet Take 1 tablet (40 mg total) by mouth daily. (Patient not taking: Reported on 07/04/2021)   ?  valACYclovir (VALTREX) 500 MG tablet TAKE 2 TABLETS BY MOUTH AT ONSET OF RASH AND 2 TABLETS 12 HOURS LATER AS NEEDED   ? ?No facility-administered encounter medications on file as of 07/20/2021.  ? ?Acetaminophen (TYLENOL) 500 MG tablet Last filled:None noted ?Alendronate (FOSAMAX) 70 MG tablet Last filled:05/12/21 84 DS ?Aspirin EC 81 MG tablet Last filled:None noted ?Betamethasone dipropionate 0.05 % cream Last filled:01/30/21 14 DS ?Furosemide (LASIX) 20 MG tablet Last filled:06/10/20 90 DS ?HYDROcodone-acetaminophen (NORCO/VICODIN) 5-325 MG tablet Last filled:06/05/21 30 DS ?LORazepam (ATIVAN) 0.5 MG tablet Last filled:06/08/21 30 DS ?Metoprolol tartrate (LOPRESSOR) 50 MG tablet Last filled:05/10/21 90 DS ?NitroGLYCERIN (NITROSTAT) 0.4 MG SL tablet Last filled:07/25/20 5 DS ?Omeprazole (PRILOSEC) 20 MG capsule Last filled:None noted ?Pantoprazole (PROTONIX) 40 MG tablet Last filled:None noted ?ValACYclovir (VALTREX) 500 MG tablet Last filled:06/26/21 8 DS ? ? ?Care Gaps: ?Zoster Vaccines:Never done ?COVID-19 Vaccine:Overdue since 06/23/2020 ? ?Star Rating Drugs: ?None noted ? ?Corrie Mckusick, RMA ?Health Concierge ? ?

## 2021-07-21 ENCOUNTER — Ambulatory Visit (INDEPENDENT_AMBULATORY_CARE_PROVIDER_SITE_OTHER): Payer: Medicare Other | Admitting: Internal Medicine

## 2021-07-21 ENCOUNTER — Encounter: Payer: Self-pay | Admitting: Internal Medicine

## 2021-07-21 VITALS — BP 116/64 | HR 67 | Temp 98.0°F | Resp 16 | Ht 66.0 in | Wt 117.0 lb

## 2021-07-21 DIAGNOSIS — R233 Spontaneous ecchymoses: Secondary | ICD-10-CM | POA: Diagnosis not present

## 2021-07-21 DIAGNOSIS — Z9189 Other specified personal risk factors, not elsewhere classified: Secondary | ICD-10-CM | POA: Diagnosis not present

## 2021-07-21 DIAGNOSIS — G4452 New daily persistent headache (NDPH): Secondary | ICD-10-CM

## 2021-07-21 DIAGNOSIS — L723 Sebaceous cyst: Secondary | ICD-10-CM | POA: Diagnosis not present

## 2021-07-21 NOTE — Patient Instructions (Signed)
Continue same medications ? ?Apply hydrocortisone cream over-the-counter 1% to the left leg. ? ?Call if you get a open sore there or the area gets swollen and warm. ? ?Next visit in 3 to 4 months. ?

## 2021-07-21 NOTE — Progress Notes (Signed)
? ?Subjective:  ? ? Patient ID: PALAK TERCERO, female    DOB: [birth year], 88 y.o.   MRN: [id number] ? ?DOS:  07/21/2021 ?Type of visit - description: Follow-up ? ?No new concerns since the last visit. ?Continue to be concerned about skin irritation at the left pretibial area ?Continue with left-sided headache ?Still has a bump at the area of a sebaceous cyst on the left arm. ?No recent falls. ? ? ?Review of Systems ?See above  ? ?Past Medical History:  ?Diagnosis Date  ? CAD (coronary artery disease)   ? mild nonobstructive disease on cath in 2003  ? Cancer [facility])   ? Cardiomyopathy   ? Probable Takotsubo, severe CP w/ normal cath in 1994. Severe CP in 2003 w/ widespread T wave inversions on ECG. Cath w/ minimal coronary disease but LV-gram showed periapical severe hypokinesis and basilar hyperkinesia (EF 40%). Last echo in 4/09 confirmed full LV functional recovery with EF 60%, no regional wall motion abnormalities, mild to moderate LVH.  ? Chronic diastolic CHF (congestive heart failure) ([place]) 09/13/2019  ? CVA (cerebral infarction)   ? Depression with anxiety 03/29/2011  ? lost husband 3'09  ? DVT (deep venous thrombosis) ([place])   ? after venous ablation, R leg  ? Fracture 09/10/2007  ? L2, status post vertebroplasty of L2 performed by IR  ? GERD (gastroesophageal reflux disease)   ? Hemorrhoids   ? Hiatal hernia 03/29/2011  ? no nerve problems  ? Hyperlipidemia   ? Hyperplastic colon polyp 06/2001  ? Hypertension   ? Iron deficiency anemia due to chronic blood loss 12/30/2017  ? OA (osteoarthritis) of knee 03/29/2011  ? w/ bilateral knee pain-not a problem now  ? OSA (obstructive sleep apnea) 03/29/2011  ? no cpap used, not a problem now.  ? Osteoporosis   ? Otalgia of both ears   ? Dr. Simeon Craft  ? Polycythemia   ? Stroke ([place]) 03/29/2011  ? CVA x2 -last 10'12-?TIA(visual problems)  ? Varicose vein   ? ? ?Past Surgical History:  ?Procedure Laterality Date  ? APPENDECTOMY    ? BACK SURGERY    ? BREAST BIOPSY    ?  BREAST CYST EXCISION    ? BREAST LUMPECTOMY Left 2013  ? left stage I left breast cancer  ? CHOLECYSTECTOMY  04/09/2011  ? Procedure: LAPAROSCOPIC CHOLECYSTECTOMY WITH INTRAOPERATIVE CHOLANGIOGRAM;  Surgeon: Odis Hollingshead, MD;  Location: [facility];  Service: General;  Laterality: N/A;  ? Dental Extraction    ? L maxillary molar  ? INTRAMEDULLARY (IM) NAIL INTERTROCHANTERIC Left 12/25/2019  ? Procedure: INTRAMEDULLARY (IM) NAIL INTERTROCHANTRIC;  Surgeon: Paralee Cancel, MD;  Location: [facility];  Service: Orthopedics;  Laterality: Left;  ? IR KYPHO THORACIC WITH BONE BIOPSY  09/15/2019  ? IR KYPHO THORACIC WITH BONE BIOPSY  07/13/2020  ?    ? IR KYPHO THORACIC WITH BONE BIOPSY  07/13/2020  ? IR VERTEBROPLASTY CERV/THOR BX INC UNI/BIL INC/INJECT/IMAGING  11/10/2019  ? KYPHOPLASTY N/A 11/16/2020  ? Procedure: T11,  L1,KYPHOPLASTY;  Surgeon: Melina Schools, MD;  Location: [facility];  Service: Orthopedics;  Laterality: N/A;  ? KYPHOSIS SURGERY  08/2007  ? cement used  ? OVARIAN CYST SURGERY    ? left  ? RADIOLOGY WITH ANESTHESIA N/A 11/10/2019  ? Procedure: VERTEBROPLASTY;  Surgeon: Luanne Bras, MD;  Location: [facility];  Service: Radiology;  Laterality: N/A;  ? TONSILLECTOMY    ? TOTAL KNEE ARTHROPLASTY  06/2010  ? left  ? TUBAL LIGATION    ? ? ?  Current Outpatient Medications  ?Medication Instructions  ? acetaminophen (TYLENOL) 500 mg, Oral, Every 6 hours PRN  ? alendronate (FOSAMAX) 70 mg, Oral, Every 7 days, Take with a full glass of water on an empty stomach.  ? aspirin EC 81 mg, Oral, Daily, Swallow whole.  ? betamethasone dipropionate 0.05 % cream Apply on to the skin 2 (two) times daily.  ? furosemide (LASIX) 20 mg, Oral, Every other day  ? HYDROcodone-acetaminophen (NORCO/VICODIN) 5-325 MG tablet 1 tablet, Oral, Daily PRN  ? lactose free nutrition (BOOST) LIQD 237 mLs, Oral, Daily  ? LORazepam (ATIVAN) 0.5 MG tablet TAKE 1 TABLET(0.5 MG) BY MOUTH TWICE DAILY AS NEEDED FOR ANXIETY  ? metoprolol tartrate (LOPRESSOR) 50 mg, Oral, 2  times daily  ? nitroGLYCERIN (NITROSTAT) 0.4 mg, Sublingual, Every 5 min x3 PRN  ? omeprazole (PRILOSEC) 20 mg, Oral, Daily  ? pantoprazole (PROTONIX) 40 mg, Oral, Daily  ? valACYclovir (VALTREX) 500 MG tablet TAKE 2 TABLETS BY MOUTH AT ONSET OF RASH AND 2 TABLETS 12 HOURS LATER AS NEEDED  ? ? ?   ?Objective:  ? Physical Exam ?BP 116/64 (BP Location: Left Arm, Patient Position: Sitting, Cuff Size: Small)   Pulse 67   Temp 98 ?F (36.7 ?C) (Oral)   Resp 16   Ht '5\' 6"'$  (1.676 m)   Wt 117 lb (53.1 kg)   SpO2 94%   BMI 18.88 kg/m?  ?General:   ?Well developed, NAD, BMI noted. ?HEENT:  ?Normocephalic . Face symmetric, atraumatic ?Lungs:  ?CTA B ?Normal respiratory effort, no intercostal retractions, no accessory muscle use. ?Heart: RRR,  no murmur.  ?Lower extremities: no pretibial edema bilaterally  ?Skin:  ?Pretibial area, L:   the area is not warm, no open sores, subcu hemosiderin noted. ?In general she has multiple superficial ecchymoses. ?Left arm: The area where she had a incision and drainage of an abscess, has now small lump with a opening, I was able to squeeze out some fatty material.  No discharge ?Neurologic:  ?alert & oriented X3.  ?Speech normal, gait assisted by cane  ?psych--  ?Cognition and judgment appear intact.  ?Cooperative with normal attention span and concentration.  ?Behavior appropriate. ?No anxious or depressed appearing.  ? ?   ?Assessment   ? ?ASSESSMENT ?HTN ?Dyslipidemia: Intolerant to most medicines including Zetia, Pravachol, Niaspan ?Depression, anxiety , insomnia - on ativan bid (03-2015 citalopram cause drowsiness, 04-2015 prozac:diarrhea) ?Hem-Onc: ?POLYCYTEMIA: sees hem-onc ?H/o breast ca ?MSK: ?---DJD  ?---Osteoporosis:  T score -2.8 (02-2013): declined treatment 06-2017 ?---L2 fractures, s/p vertebral plasty 2009, s/p vertebral plasty T 8 07/2020 ?Migraine headaches - Tylenol #3 (rarely) ?GI: ?GERD- intolerant to prevacid, visual disturbances ?HH, diverticulitis, Colon  polyps ?h/o "functional " LLQ abd pain ?CV: ?---Cardiomyopathy ?Probable Takotsubo cardiomyopathy: CP w/ normal cath in 1994.   ?Severe CP  2003 with widespread T wave inversions on ECG.  Cath with minimal coronary disease but LV-gram showed periapical severe hypokinesis and basilar hyperkinesia (EF 40%) >>> ECHO 4/09 confirmed full LV functional recovery with EF 60%, mild LVH. ?2012: Nl EF per echo, (-) Lexiscan ?05/21/18 ECHO Nl/stable ?---Stroke / TIA 2012 ?---ICB seizure 10/2019 was rx keppra, self d/c 05/2019 ?Sleep apnea 2012, , not on CPAP ?Hematology: ?---H/o DVT, right leg after venous ablation ?--- Pulmonary emboli  dx 12-25-20 ?Raynaud phenomena, DX 05/2018 ?  ?PLAN ?No new concerns. ?Easy bruising-Capillary fragility: She is concerned about the skin at the left pretibial area, no major infection at this point, recommend observation and  local care ?Sebaceous cyst, L left arm: Still have some changes consistent with a cyst, it is less than 1 cm in size.  Observation. ?Med compliance.  Meds were reviewed by my nurse and me.  Recommend compliance with medication list provided ?Headache: Still an issue, see previous entries, wife referred to neurology, encouraged to call them. ?RTC 3 to 4 months ? ?This visit occurred during the SARS-CoV-2 public health emergency.  Safety protocols were in place, including screening questions prior to the visit, additional usage of staff PPE, and extensive cleaning of exam room while observing appropriate contact time as indicated for disinfecting solutions.  ? ?

## 2021-07-22 NOTE — Assessment & Plan Note (Signed)
No new concerns. ?Easy bruising-Capillary fragility: She is concerned about the skin at the left pretibial area, no major infection at this point, recommend observation and local care ?Sebaceous cyst, L left arm: Still have some changes consistent with a cyst, it is less than 1 cm in size.  Observation. ?Med compliance.  Meds were reviewed by my nurse and me.  Recommend compliance with medication list provided ?Headache: Still an issue, see previous entries, wife referred to neurology, encouraged to call them. ?RTC 3 to 4 months ?

## 2021-07-24 ENCOUNTER — Telehealth: Payer: Medicare Other

## 2021-07-24 ENCOUNTER — Telehealth: Payer: Self-pay | Admitting: Pharmacist

## 2021-07-24 NOTE — Telephone Encounter (Signed)
?  Care Management  ? ?Follow Up Note ? ? ?07/24/2021 ?Name: [name] MRN: [id number] DOB: 08-06-1929 ? ? ?Referred by: Colon Branch, MD ?Reason for referral : No chief complaint on file. ? ? ?An unsuccessful telephone outreach was attempted today. The patient was referred to the case management team for assistance with care management and care coordination.  ? ?Follow Up Plan: The care management team will reach out to the patient again over the next 14 days.  ? ?Cherre Robins, PharmD ?Clinical Pharmacist ?[facility] ?[facility] ? ? ?

## 2021-08-02 NOTE — Chronic Care Management (AMB) (Signed)
?  Care Management  ? ?Note ? ?08/02/2021 ?Name: [name] MRN: [id number] DOB: [birth year] ? ?[name] is a 88 y.o. year old female who is a primary care patient of Colon Branch, MD and is actively engaged with the care management team. I reached out to [name] by phone today to assist with re-scheduling an initial visit with the Licensed Clinical Social Worker ? ?Follow up plan: ?Unsuccessful telephone outreach attempt made. A HIPAA compliant phone message was left for the patient providing contact information and requesting a return call.  ? ?Romaine Maciolek, CCMA ?Care Guide, Embedded Care Coordination ?[place]  Care Management  ?Direct Dial: [phone] ? ? ?

## 2021-08-07 NOTE — Chronic Care Management (AMB) (Signed)
?  Care Management  ? ?Note ? ?08/07/2021 ?Name: [name] MRN: [id number] DOB: [birth year] ? ?[name] is a 88 y.o. year old female who is a primary care patient of Colon Branch, MD and is actively engaged with the care management team. I reached out to Dorris Carnes by phone today to assist with re-scheduling an initial visit with the Pharmacist ? ?Follow up plan: ?Telephone appointment with care management team member scheduled for: 08/10/2021 ? ?Ireland Chagnon, CCMA ?Care Guide, Embedded Care Coordination ?[place]  Care Management  ?Direct Dial: [phone] ? ? ?

## 2021-08-08 ENCOUNTER — Other Ambulatory Visit: Payer: Self-pay | Admitting: Internal Medicine

## 2021-08-08 ENCOUNTER — Telehealth: Payer: Self-pay | Admitting: Internal Medicine

## 2021-08-08 NOTE — Telephone Encounter (Signed)
Error

## 2021-08-09 ENCOUNTER — Ambulatory Visit: Payer: Medicare Other | Admitting: Internal Medicine

## 2021-08-09 NOTE — Telephone Encounter (Signed)
PDMP okay, Rx sent 

## 2021-08-09 NOTE — Telephone Encounter (Signed)
Requesting: lorazepam 0.'5mg'$   ?Contract:04/22/20 ?UDS: 02/13/21 ?Last Visit: 07/21/21 ?Next Visit: Today ?Last Refill: 05/19/21 #60 and 2RF ? ?Please Advise ? ?

## 2021-08-10 ENCOUNTER — Ambulatory Visit (INDEPENDENT_AMBULATORY_CARE_PROVIDER_SITE_OTHER): Payer: Medicare Other | Admitting: Pharmacist

## 2021-08-10 DIAGNOSIS — I5181 Takotsubo syndrome: Secondary | ICD-10-CM

## 2021-08-10 DIAGNOSIS — M810 Age-related osteoporosis without current pathological fracture: Secondary | ICD-10-CM

## 2021-08-10 DIAGNOSIS — I10 Essential (primary) hypertension: Secondary | ICD-10-CM

## 2021-08-10 DIAGNOSIS — K219 Gastro-esophageal reflux disease without esophagitis: Secondary | ICD-10-CM

## 2021-08-10 DIAGNOSIS — Z9189 Other specified personal risk factors, not elsewhere classified: Secondary | ICD-10-CM

## 2021-08-10 DIAGNOSIS — S22070D Wedge compression fracture of T9-T10 vertebra, subsequent encounter for fracture with routine healing: Secondary | ICD-10-CM

## 2021-08-10 DIAGNOSIS — I5032 Chronic diastolic (congestive) heart failure: Secondary | ICD-10-CM

## 2021-08-10 DIAGNOSIS — R233 Spontaneous ecchymoses: Secondary | ICD-10-CM

## 2021-08-10 NOTE — Chronic Care Management (AMB) (Signed)
? ? ?Chronic Care Management ?Pharmacy Note ? ?08/10/2021 ?Name:  Dawn [name] MRN:  [id number] DOB:  01/14/1930 ? ?Summary: ?Reviewed patient medications and discussed adherence.  ?Recommended using weekly pill container to help with adherence. Also discussed getting medication thru [facility] mail order service since she does not drive a lot. Patient is considering but she does like her monthly trips to the pharmacy.  ?Reminded patient to restart aspirin 57m daily (just has not taken in about 1 week due to running out but plans to buy tomorrow)  ?Last DEXA was 2014. Patient is taking alendronate. Consider rechecking Dexa to assess for efficacy.  ?Patient checks weight about every other day. Endorsed that she is taking furosemide every OTHER day.  ?Therapeutic duplication - has both prilosec and pantoprazole on med list but patient endorsed she is only taking Prilosec over-the-counter as needed. Removed pantoprazole from list.  ? ?[name] see PCP 09/19/2021 - pharmacist to check back with patient in 3 months ? ? ?Subjective: ?Dawn MCCANNONis an 88y.o. year old female who is a primary patient of Paz, JAlda Berthold MD.  The CCM team was consulted for assistance with disease management and care coordination needs.   ? ?Engaged with patient by telephone for initial visit in response to provider referral for pharmacy case management and/or care coordination services.  ? ?Consent to Services:  ?The patient was given the following information about Chronic Care Management services today, agreed to services, and gave verbal consent: 1. CCM service includes personalized support from designated clinical staff supervised by the primary care provider, including individualized plan of care and coordination with other care providers 2. 24/7 contact phone numbers for assistance for urgent and routine care needs. 3. Service [name] only be billed when office clinical staff spend 20 minutes or more in a month to coordinate care. 4. Only  one practitioner may furnish and bill the service in a calendar month. 5.The patient may stop CCM services at any time (effective at the end of the month) by phone call to the office staff. 6. The patient [name] be responsible for cost sharing (co-pay) of up to 20% of the service fee (after annual deductible is met). Patient agreed to services and consent obtained. ? ?Patient Care Team: ?PColon Branch MD as PCP - General ?MRichardo Priest MD as PCP - Cardiology (Cardiology) ?CCelso Amy NP as Nurse Practitioner (Nurse Practitioner) ?EVolanda Napoleon MD as Consulting Physician (Oncology) ?DMelrose Nakayama MD as Consulting Physician (Orthopedic Surgery) ?BMelina Schools MD as Consulting Physician (Orthopedic Surgery) ? ?Recent office visits: ?07/21/2021 - Int Med (Dr PLarose Kells Seen for follow up of easy bruising. Also checked skin irriation at the left pretibial area, left sided headaches an dbump at the area of sebaceous cyst on left arm. Recommended hydrocortisone cream 1% apply to left leg. F/U 3 to 4 months unless needed sooner.  ?07/04/2021 - Int Med (Dr PLarose Kells Seen for easy bruising on lower extremities. Off Eliqius and taking Aspirin. Concerned about medication compliance. ? ?Recent consult visits: ?05/10/21-Peter R. EMarin Olp MD (Oncology) Hematology and oncology follow up visit. Start on Aspirin. Follow up in 1 month. ?05/01/21-James ZLoreli Slot MD (Ophthalmology) Initial consult. Follow up in 1 year. ?03/09/21-Brian JMora Bellman MD (Cardiology) Seen for CHF. RESTART: Furosemide 20 mg take one tablet by mouth every other day. START: Protonix 40 mg take one tablet by mouth daily. Follow up in 6 months. ?02/15/21-Peter R. EMarin Olp MD (Oncology) Hematology and oncology follow up visit. Follow  up in 2 months. ? ?Hospital visits: ?05/29/21 ED Visit at [facility] for Ankle pain.  ?New?Medications Started at [facility] Discharge:??None noted  ?Medication Changes at Hospital Discharge: None noted ?Medications  Discontinued at Hospital Discharge: None noted ?  ?05/02/21 ED Visit at [facility] due to Fall.  ?New?Medications Started at [facility] Discharge:??None noted ?Medication Changes at Hospital Discharge: None noted ?Medications Discontinued at Hospital Discharge: None noted ? ?Objective: ? ?Lab Results  ?Component Value Date  ? CREATININE 0.87 05/10/2021  ? CREATININE 0.84 03/22/2021  ? CREATININE 0.86 02/15/2021  ? ? ?No results found for: HGBA1C ?Last diabetic Eye exam: No results found for: HMDIABEYEEXA  ?Last diabetic Foot exam: No results found for: HMDIABFOOTEX  ? ?   ?Component Value Date/Time  ? CHOL 212 (H) 03/19/2014 1141  ? TRIG 110.0 03/19/2014 1141  ? HDL 47.20 03/19/2014 1141  ? CHOLHDL 4 03/19/2014 1141  ? VLDL 22.0 03/19/2014 1141  ? LDLCALC 143 (H) 03/19/2014 1141  ? LDLDIRECT 142.3 06/25/2011 0954  ? ? ? ?  Latest Ref Rng & Units 05/10/2021  ? 12:02 PM 02/15/2021  ?  1:16 PM 12/25/2020  ?  5:45 AM  ?Hepatic Function  ?Total Protein 6.5 - 8.1 g/dL 6.9   6.3   6.1    ?Albumin 3.5 - 5.0 g/dL 4.2   4.0   3.5    ?AST 15 - 41 U/L _0 ?ALT 0 - 44 U/L _1 ?Alk Phosphatase 38 - 126 U/L 51   57   59    ?Total Bilirubin 0.3 - 1.2 mg/dL 1.4   1.4   2.4    ? ? ?Lab Results  ?Component Value Date/Time  ? TSH 2.61 04/22/2020 02:44 PM  ? TSH 2.963 12/04/2018 02:32 PM  ? TSH 1.74 09/02/2017 01:47 PM  ? ? ? ?  Latest Ref Rng & Units 05/10/2021  ? 12:02 PM 02/15/2021  ?  1:16 PM 12/26/2020  ?  7:19 AM  ?CBC  ?WBC 4.0 - 10.5 K/uL 6.1   3.2   3.6    ?Hemoglobin 12.0 - 15.0 g/dL 15.4   12.9   11.8    ?Hematocrit 36.0 - 46.0 % 47.4   38.3   35.0    ?Platelets 150 - 400 K/uL 296   176   154    ? ? ?Lab Results  ?Component Value Date/Time  ? VD25OH 30.35 10/06/2020 03:15 AM  ? VD25OH 27.26 (L) 09/13/2019 09:22 AM  ? ? ?Clinical ASCVD: Yes  ?The ASCVD Risk score (Arnett DK, et al., 2019) failed to calculate for the following reasons: ?  The 2019 ASCVD risk score is only valid for ages 47 to 57 ?   The patient has a prior MI or stroke diagnosis   ? ? ?Social History  ? ?Tobacco Use  ?Smoking Status Some Days  ? Packs/day: 1.00  ? Years: 37.00  ? Pack years: 37.00  ? Types: Cigarettes  ? Start date: 06/26/1951  ?Smokeless Tobacco Never  ?Tobacco Comments  ? quit 25 years ago  ? ?BP Readings from Last 3 Encounters:  ?07/21/21 116/64  ?07/04/21 120/82  ?06/13/21 132/76  ? ?Pulse Readings from Last 3 Encounters:  ?07/21/21 67  ?07/04/21 70  ?06/13/21 67  ? ?Wt Readings from Last 3 Encounters:  ?07/21/21 117 lb (53.1 kg)  ?07/04/21 115 lb 8 oz (52.4 kg)  ?  06/13/21 113 lb 6 oz (51.4 kg)  ? ? ?Assessment: Review of patient past medical history, allergies, medications, health status, including review of consultants reports, laboratory and other test data, was performed as part of comprehensive evaluation and provision of chronic care management services.  ? ?SDOH:  (Social Determinants of Health) assessments and interventions performed:  ?SDOH Interventions   ? ?Flowsheet Row Most Recent Value  ?SDOH Interventions   ?Financial Strain Interventions Intervention Not Indicated  ?Transportation Interventions Intervention Not Indicated  ? ?  ? ? ?[place] ? ?Allergies  ?Allergen Reactions  ? Penicillins Hives, Rash and Other (See Comments)  ?  Has patient had a PCN reaction causing immediate rash, facial/tongue/throat swelling, SOB or lightheadedness with hypotension: Yes ?Has patient had a PCN reaction causing severe rash involving mucus membranes or skin necrosis: yes ?Has patient had a PCN reaction that required hospitalization: No ?Has patient had a PCN reaction occurring within the last 10 years: no ?If all of the above answers are "NO", then may proceed with Cephalosporin use. ? ?Other Reaction: OTHER REACTION  ? Valsartan Itching and Other (See Comments)  ? Atorvastatin Diarrhea  ? Carvedilol Other (See Comments)  ?  "teeth hurt when I took it" ?  ? Ezetimibe Nausea Only  ? Fluvastatin Sodium Other (See  Comments)  ?  "Can't remember"  ? Magnesium Hydroxide Other (See Comments)  ?  REACTION: triggers HAs  ? Meloxicam Other (See Comments)  ?  Pt seeing auro's / spots.    ? Pneumovax [Pneumococcal Polysaccharide Vac

## 2021-08-11 DIAGNOSIS — M810 Age-related osteoporosis without current pathological fracture: Secondary | ICD-10-CM

## 2021-08-11 DIAGNOSIS — I10 Essential (primary) hypertension: Secondary | ICD-10-CM

## 2021-08-11 DIAGNOSIS — I5032 Chronic diastolic (congestive) heart failure: Secondary | ICD-10-CM

## 2021-08-13 NOTE — Patient Instructions (Signed)
[name],  ?It was a pleasure speaking with you  ?Below is a summary of your health goals and care plan ? ? ?Patient Goals/Self-Care Activities ?Over the next 90 days, patient will:  ?take medications as prescribed,  ?focus on medication adherence by using weekly pill container,  ?check blood pressure 1 to 2 times per wek, document, and provide at future appointments,  ?weigh daily, and contact provider if weight gain of more than 3 pounds in 24 hours and 5 pounds in 1 week, and  ?collaborate with provider on medication access solutions ?Make sure to restart aspirin '81mg'$  daily ? ? ?If you have any questions or concerns, please feel free to contact me either at the phone number below or with a MyChart message.  ? ?Keep up the good work! ? ?Cherre Robins, PharmD ?Clinical Pharmacist ?[facility] ?[place] ?[phone] (direct line)  ?[phone] (main office number) ? ? ?Chronic Care Management Care Plan ? ?Hypertension / chronic heart failure: ?Controlled; blood pressure goal < 140/90 ?BP Readings from Last 3 Encounters:  ?07/21/21 116/64  ?07/04/21 120/82  ?06/13/21 132/76  ? ?Current treatment: ?Furosemide '20mg'$  - take 1 tablet once every OTHER day ? Metoprolol tartrate '50mg'$  - take 1 tablet twice a day ?Interventions:  ?Educated on blood pressure goal ?Recommended continue current therapy for blood pressure and heart  ?Check blood pressure 1 to 2 times per week, record for future visits.  ?Discussed signs and symptoms of CHF exacerbation - weight gain, SOB, abdominal fullness, swelling in legs or abdomen, Fatigue and weakness, changes in ability to perform usual activities, persistent cough or wheezing with white or pink blood-tinged mucus, nausea and lack of appetite. Contact medical provider as soon as possible if you experience any of these symptoms. ?Continue to weigh daily to every other day - report weight gain of more than 3 lbs in 24 hours or 5 lbs in 1 week. ? ?Hyperlipidemia /  heart disease / history of blood clot: ?Not at goal;  LDL goal < 70  ?Has been unable to tolerate statin therapy in past ? ?Lab Results  ?Component Value Date  ? CHOL 212 (H) 03/19/2014  ? HDL 47.20 03/19/2014  ? LDLCALC 143 (H) 03/19/2014  ? LDLDIRECT 142.3 06/25/2011  ? TRIG 110.0 03/19/2014  ? CHOLHDL 4 03/19/2014  ? ?Current treatment: ?Aspirin '81mg'$  daily  ?Interventions:  ?Discussed benefits of lipid lowering therapy - patient declines ?Recommended she restart aspirin '81mg'$  daily - discussed importance of continuing aspirin since no longer taking Eliquis. ?Limit dietary intake of saturated and trans fat  ? ?Osteoporosis: ?Current treatment: ?Alendronate '70mg'$  weekly  ?Intervention:  ?Reviewed administration of Alendronate therapy ?Consider rechecking DEXA ? ?Acid Reflux: ?Controlled; no breakthrough reflux symptoms ?Current treatment:  ?Omeprazole '20mg'$  daily  ?Interventions:  ?Removed pantoprazole from medication list.  ?Continue to take omeprazole ? ?Medication management ?Pharmacist Clinical Goal(s): ?Over the next 90 days, patient will work with PharmD and providers to maintain optimal medication adherence ?Interventions ?Comprehensive medication review performed. ?Reviewed refill history and assessed adherence ?Discussed strategies to improve adherence - suggested using weekly pill container. Also discussed home delivery options for medications. Patient to consider. If interested can assist with transition to mail order or pharmacy that will deliver medications.  ? ?Patient Goals/Self-Care Activities ?Over the next 90 days, patient will:  ?take medications as prescribed,  ?focus on medication adherence by using weekly pill container,  ?check blood pressure 1 to 2 times per wek, document, and provide at  future appointments,  ?weigh daily, and contact provider if weight gain of more than 3 pounds in 24 hours and 5 pounds in 1 week, and  ?collaborate with provider on medication access solutions ?Make sure to  restart aspirin '81mg'$  daily ? ?Follow Up Plan: Telephone follow up appointment with care management team member scheduled for:  2 to 3 months ? ?The patient verbalized understanding of instructions, educational materials, and care plan provided today and agreed to receive a mailed copy of patient instructions, educational materials, and care plan.   ? ?Heart-Healthy Eating Plan ?Many factors influence your heart (coronary) health, including eating and exercise habits. Coronary risk increases with abnormal blood fat (lipid) levels. Heart-healthy meal planning includes limiting unhealthy fats, increasing healthy fats, and making other diet and lifestyle changes. ?What is my plan? ?Your health care provider may recommend that you: ?Limit your fat intake to 30% or less of your total calories each day. ?Limit your saturated fat intake to 10% or less of your total calories each day. ?Limit the amount of cholesterol in your diet to less than 300 mg per day. ?What are tips for following this plan? ?Cooking ?Cook foods using methods other than frying. Baking, boiling, grilling, and broiling are all good options. Other ways to reduce fat include: ?Removing the skin from poultry. ?Removing all visible fats from meats. ?Steaming vegetables in water or broth. ?Meal planning ? ?At meals, imagine dividing your plate into fourths: ?Fill one-half of your plate with vegetables and green salads. ?Fill one-fourth of your plate with whole grains. ?Fill one-fourth of your plate with lean protein foods. ?Eat 4-5 servings of vegetables per day. One serving equals 1 cup raw or cooked vegetable, or 2 cups raw leafy greens. ?Eat 4-5 servings of fruit per day. One serving equals 1 medium whole fruit, ? cup dried fruit, ? cup fresh, frozen, or canned fruit, or ? cup 100% fruit juice. ?Eat more foods that contain soluble fiber. Examples include apples, broccoli, carrots, beans, peas, and barley. Aim to get 25-30 g of fiber per day. ?Increase your  consumption of legumes, nuts, and seeds to 4-5 servings per week. One serving of dried beans or legumes equals ? cup cooked, 1 serving of nuts is ? cup, and 1 serving of seeds equals 1 tablespoon. ?Fats ?Choose healthy fats more often. Choose monounsaturated and polyunsaturated fats, such as olive and canola oils, flaxseeds, walnuts, almonds, and seeds. ?Eat more omega-3 fats. Choose salmon, mackerel, sardines, tuna, flaxseed oil, and ground flaxseeds. Aim to eat fish at least 2 times each week. ?Check food labels carefully to identify foods with trans fats or high amounts of saturated fat. ?Limit saturated fats. These are found in animal products, such as meats, butter, and cream. Plant sources of saturated fats include palm oil, palm kernel oil, and coconut oil. ?Avoid foods with partially hydrogenated oils in them. These contain trans fats. Examples are stick margarine, some tub margarines, cookies, crackers, and other baked goods. ?Avoid fried foods. ?General information ?Eat more home-cooked food and less restaurant, buffet, and fast food. ?Limit or avoid alcohol. ?Limit foods that are high in starch and sugar. ?Lose weight if you are overweight. Losing just 5-10% of your body weight can help your overall health and prevent diseases such as diabetes and heart disease. ?Monitor your salt (sodium) intake, especially if you have high blood pressure. Talk with your health care provider about your sodium intake. ?Try to incorporate more vegetarian meals weekly. ?What foods can I eat? ?  Fruits ?All fresh, canned (in natural juice), or frozen fruits. ?Vegetables ?Fresh or frozen vegetables (raw, steamed, roasted, or grilled). Green salads. ?Grains ?Most grains. Choose whole wheat and whole grains most of the time. Rice and pasta, including brown rice and pastas made with whole wheat. ?Meats and other proteins ?Lean, well-trimmed beef, veal, pork, and lamb. Chicken and [place] without skin. All fish and shellfish. Wild  duck, rabbit, pheasant, and venison. Egg whites or low-cholesterol egg substitutes. Dried beans, peas, lentils, and tofu. Seeds and most nuts. ?Dairy ?Low-fat or nonfat cheeses, including ricotta and mo

## 2021-08-23 ENCOUNTER — Telehealth: Payer: Self-pay | Admitting: Hematology & Oncology

## 2021-08-23 NOTE — Telephone Encounter (Signed)
Patient called requesting a sooner appointment , called patient back unable to reach patient and unable to LVM (VM full) - pt can be scheduled with Dr. Johnette Abraham or Sarah per 4/12 schedule message  ?

## 2021-08-24 ENCOUNTER — Telehealth: Payer: Self-pay

## 2021-08-24 NOTE — Telephone Encounter (Signed)
Called pt to schedule awv and unable to lvm  ?

## 2021-08-28 ENCOUNTER — Telehealth: Payer: Self-pay | Admitting: Hematology & Oncology

## 2021-08-28 NOTE — Telephone Encounter (Signed)
2x attempt , patient called 4/12 and LVM requesting to move appt 5/9 to an earlier date, called patient and unable to leave voicemail , vm full  ?

## 2021-08-29 ENCOUNTER — Encounter: Payer: Self-pay | Admitting: Internal Medicine

## 2021-08-29 ENCOUNTER — Ambulatory Visit (INDEPENDENT_AMBULATORY_CARE_PROVIDER_SITE_OTHER): Payer: Medicare Other | Admitting: Internal Medicine

## 2021-08-29 VITALS — BP 126/64 | HR 61 | Temp 98.1°F | Resp 18 | Ht 66.0 in | Wt 121.1 lb

## 2021-08-29 DIAGNOSIS — L723 Sebaceous cyst: Secondary | ICD-10-CM | POA: Diagnosis not present

## 2021-08-29 DIAGNOSIS — D751 Secondary polycythemia: Secondary | ICD-10-CM

## 2021-08-29 DIAGNOSIS — M79604 Pain in right leg: Secondary | ICD-10-CM | POA: Diagnosis not present

## 2021-08-29 NOTE — Patient Instructions (Addendum)
Please schedule a Medicare Wellness visit  ? ?Tylenol  500 mg OTC 2 tabs a day every 8 hours as needed for pain ? ?Please call Dr. Marin Olp office, the number is [phone]. ?They have been trying to contact you ?

## 2021-08-29 NOTE — Progress Notes (Signed)
? ?Subjective:  ? ? Patient ID: [name], female    DOB: [birth year], 88 y.o.   MRN: [id number] ? ?DOS:  08/29/2021 ?Type of visit - description: Acute ? ?Again, complaining of left arm pain at the site of a sebaceous cyst. ? ?Also pain at the popliteal area, right leg.  At some point was diagnosed with a Baker's cyst. ?Pain is not necessarily worse when she walks but she does experience some right calf discomfort with walking.  No pain at rest. ? ?Wonders when she needs to see Dr. Marin Olp ? ? ? Review of Systems ?See above  ? ?Past Medical History:  ?Diagnosis Date  ? CAD (coronary artery disease)   ? mild nonobstructive disease on cath in 2003  ? [facility])   ? Cardiomyopathy   ? Probable Takotsubo, severe CP w/ normal cath in 1994. Severe CP in 2003 w/ widespread T wave inversions on ECG. Cath w/ minimal coronary disease but LV-gram showed periapical severe hypokinesis and basilar hyperkinesia (EF 40%). Last echo in 4/09 confirmed full LV functional recovery with EF 60%, no regional wall motion abnormalities, mild to moderate LVH.  ? Chronic diastolic CHF (congestive heart failure) ([place]) 09/13/2019  ? CVA (cerebral infarction)   ? Depression with anxiety 03/29/2011  ? lost husband 3'09  ? DVT (deep venous thrombosis) ([place])   ? after venous ablation, R leg  ? Fracture 09/10/2007  ? L2, status post vertebroplasty of L2 performed by IR  ? GERD (gastroesophageal reflux disease)   ? Hemorrhoids   ? Hiatal hernia 03/29/2011  ? no nerve problems  ? Hyperlipidemia   ? Hyperplastic colon polyp 06/2001  ? Hypertension   ? Iron deficiency anemia due to chronic blood loss 12/30/2017  ? OA (osteoarthritis) of knee 03/29/2011  ? w/ bilateral knee pain-not a problem now  ? OSA (obstructive sleep apnea) 03/29/2011  ? no cpap used, not a problem now.  ? Osteoporosis   ? Otalgia of both ears   ? Dr. Simeon Craft  ? Polycythemia   ? Stroke ([place]) 03/29/2011  ? CVA x2 -last 10'12-?TIA(visual problems)  ? Varicose vein   ? ? ?Past  Surgical History:  ?Procedure Laterality Date  ? APPENDECTOMY    ? BACK SURGERY    ? BREAST BIOPSY    ? BREAST CYST EXCISION    ? BREAST LUMPECTOMY Left 2013  ? left stage I left breast cancer  ? CHOLECYSTECTOMY  04/09/2011  ? Procedure: LAPAROSCOPIC CHOLECYSTECTOMY WITH INTRAOPERATIVE CHOLANGIOGRAM;  Surgeon: Odis Hollingshead, MD;  Location: [facility];  Service: General;  Laterality: N/A;  ? Dental Extraction    ? L maxillary molar  ? INTRAMEDULLARY (IM) NAIL INTERTROCHANTERIC Left 12/25/2019  ? Procedure: INTRAMEDULLARY (IM) NAIL INTERTROCHANTRIC;  Surgeon: Paralee Cancel, MD;  Location: [facility];  Service: Orthopedics;  Laterality: Left;  ? IR KYPHO THORACIC WITH BONE BIOPSY  09/15/2019  ? IR KYPHO THORACIC WITH BONE BIOPSY  07/13/2020  ?    ? IR KYPHO THORACIC WITH BONE BIOPSY  07/13/2020  ? IR VERTEBROPLASTY CERV/THOR BX INC UNI/BIL INC/INJECT/IMAGING  11/10/2019  ? KYPHOPLASTY N/A 11/16/2020  ? Procedure: T11,  L1,KYPHOPLASTY;  Surgeon: Melina Schools, MD;  Location: [facility];  Service: Orthopedics;  Laterality: N/A;  ? KYPHOSIS SURGERY  08/2007  ? cement used  ? OVARIAN CYST SURGERY    ? left  ? RADIOLOGY WITH ANESTHESIA N/A 11/10/2019  ? Procedure: VERTEBROPLASTY;  Surgeon: Luanne Bras, MD;  Location: [facility];  Service:  Radiology;  Laterality: N/A;  ? TONSILLECTOMY    ? TOTAL KNEE ARTHROPLASTY  06/2010  ? left  ? TUBAL LIGATION    ? ? ?Current Outpatient Medications  ?Medication Instructions  ? acetaminophen (TYLENOL) 500 mg, Oral, Every 6 hours PRN  ? alendronate (FOSAMAX) 70 MG tablet TAKE 1 TABLET(70 MG) BY MOUTH EVERY 7 DAYS WITH A FULL GLASS OF WATER AND ON AN EMPTY STOMACH  ? aspirin EC 81 mg, Oral, Daily, Swallow whole.  ? betamethasone dipropionate 0.05 % cream Apply on to the skin 2 (two) times daily.  ? furosemide (LASIX) 20 mg, Oral, Every other day  ? HYDROcodone-acetaminophen (NORCO/VICODIN) 5-325 MG tablet 1 tablet, Oral, Daily PRN  ? lactose free nutrition (BOOST) LIQD 237 mLs, Oral, Daily  ? LORazepam  (ATIVAN) 0.5 MG tablet TAKE 1 TABLET(0.5 MG) BY MOUTH TWICE DAILY AS NEEDED FOR ANXIETY  ? metoprolol tartrate (LOPRESSOR) 50 MG tablet TAKE 1 TABLET(50 MG) BY MOUTH TWICE DAILY  ? nitroGLYCERIN (NITROSTAT) 0.4 mg, Sublingual, Every 5 min x3 PRN  ? omeprazole (PRILOSEC) 20 mg, Oral, Daily  ? valACYclovir (VALTREX) 500 MG tablet TAKE 2 TABLETS BY MOUTH AT ONSET OF RASH AND 2 TABLETS 12 HOURS LATER AS NEEDED  ? ? ?   ?Objective:  ? Physical Exam ?BP 126/64 (BP Location: Right Arm, Patient Position: Sitting, Cuff Size: Small)   Pulse 61   Temp 98.1 ?F (36.7 ?C) (Oral)   Resp 18   Ht '5\' 6"'$  (1.676 m)   Wt 121 lb 2 oz (54.9 kg)   SpO2 92%   BMI 19.55 kg/m?  ?General:   ?Well developed, NAD, BMI noted. ?HEENT:  ?Normocephalic . Face symmetric, atraumatic ?Abdomen: Soft, nontender, no bruit ?Lower extremities:  ?no pretibial edema bilaterally.  Good femoral and pedal pulses bilaterally.  Multiple small ecchymosis noted, no openings.  Has some varicose veins and the left calf without evidence of phlebitis. ?Calf symmetric and nontender ?Popliteal areas: Symmetric bilaterally with no obvious TTP, swelling or Baker's cyst. ?L arm: ?The area where I previously incised and drained a cyst, it is not red or swollen, no openings.  She does have a 1 cm subcu mobile mass consistent with a sebaceous cyst. ?Neurologic:  ?alert & oriented X3.  ?Speech normal, gait @ baseline ?Psych--  ?Cognition and judgment appear intact.  ?Cooperative with normal attention span and concentration.  ?Behavior appropriate. ?No anxious or depressed appearing.  ? ?   ?Assessment   ? ?  ?ASSESSMENT ?HTN ?Dyslipidemia: Intolerant to most medicines including Zetia, Pravachol, Niaspan ?Depression, anxiety , insomnia - on ativan bid (03-2015 citalopram cause drowsiness, 04-2015 prozac:diarrhea) ?Hem-Onc: ?POLYCYTEMIA: sees hem-onc ?H/o breast ca ?MSK: ?---DJD  ?---Osteoporosis:  T score -2.8 (02-2013): declined treatment 06-2017 ?---L2 fractures, s/p  vertebral plasty 2009, s/p vertebral plasty T 8 07/2020 ?Migraine headaches - Tylenol #3 (rarely) ?GI: ?GERD- intolerant to prevacid, visual disturbances ?HH, diverticulitis, Colon polyps ?h/o "functional " LLQ abd pain ?CV: ?---Cardiomyopathy ?Probable Takotsubo cardiomyopathy: CP w/ normal cath in 1994.   ?Severe CP  2003 with widespread T wave inversions on ECG.  Cath with minimal coronary disease but LV-gram showed periapical severe hypokinesis and basilar hyperkinesia (EF 40%) >>> ECHO 4/09 confirmed full LV functional recovery with EF 60%, mild LVH. ?2012: Nl EF per echo, (-) Lexiscan ?05/21/18 ECHO Nl/stable ?---Stroke / TIA 2012 ?---ICB seizure 10/2019 was rx keppra, self d/c 05/2019 ?Sleep apnea 2012, , not on CPAP ?Hematology: ?---H/o DVT, right leg after venous ablation ?---  Pulmonary emboli  dx 12-25-20 ?Raynaud phenomena, DX 05/2018 ?  ?PLAN ?Sebaceous cyst, L arm: No evidence of infection at this point, patient reports pain at the area, for now advised Tylenol and perhaps a cool compress.  Recommend to avoid naproxen . ?Pain, right leg: Located on the popliteal area, exam is benign.  She also mention calf pain, no obvious vascular insufficiency on exam.  Recommend observation. ?Polycythemia: Due to see hematology, see AVS. ? ? ?This visit occurred during the SARS-CoV-2 public health emergency.  Safety protocols were in place, including screening questions prior to the visit, additional usage of staff PPE, and extensive cleaning of exam room while observing appropriate contact time as indicated for disinfecting solutions.  ? ?

## 2021-08-30 NOTE — Assessment & Plan Note (Signed)
Sebaceous cyst, L arm: No evidence of infection at this point, patient reports pain at the area, for now advised Tylenol and perhaps a cool compress.  Recommend to avoid naproxen . ?Pain, right leg: Located on the popliteal area, exam is benign.  She also mention calf pain, no obvious vascular insufficiency on exam.  Recommend observation. ?Polycythemia: Due to see hematology, see AVS. ?

## 2021-09-01 ENCOUNTER — Telehealth: Payer: Self-pay | Admitting: Pharmacist

## 2021-09-01 NOTE — Telephone Encounter (Signed)
Patient called stating she would like to have her appt with hematology moved sooner because she feels that she needs phlebotomy however she has no been able to get appointment. Next appt currently 09/19/21. Conference called to [place] [facility] and was able to change her appt to Tuesday 09/05/2021 at noon with Lottie Dawson, NP. ?

## 2021-09-05 ENCOUNTER — Inpatient Hospital Stay: Payer: Medicare Other

## 2021-09-05 ENCOUNTER — Inpatient Hospital Stay: Payer: Medicare Other | Attending: Hematology & Oncology

## 2021-09-05 ENCOUNTER — Telehealth: Payer: Self-pay | Admitting: *Deleted

## 2021-09-05 ENCOUNTER — Encounter: Payer: Self-pay | Admitting: Family

## 2021-09-05 ENCOUNTER — Inpatient Hospital Stay (HOSPITAL_BASED_OUTPATIENT_CLINIC_OR_DEPARTMENT_OTHER): Payer: Medicare Other | Admitting: Family

## 2021-09-05 VITALS — BP 176/56 | HR 58 | Temp 98.0°F | Resp 18 | Wt 117.1 lb

## 2021-09-05 VITALS — BP 171/68 | HR 61 | Resp 19

## 2021-09-05 DIAGNOSIS — D500 Iron deficiency anemia secondary to blood loss (chronic): Secondary | ICD-10-CM | POA: Diagnosis not present

## 2021-09-05 DIAGNOSIS — D509 Iron deficiency anemia, unspecified: Secondary | ICD-10-CM | POA: Diagnosis not present

## 2021-09-05 DIAGNOSIS — R5383 Other fatigue: Secondary | ICD-10-CM | POA: Diagnosis not present

## 2021-09-05 DIAGNOSIS — D45 Polycythemia vera: Secondary | ICD-10-CM | POA: Diagnosis not present

## 2021-09-05 DIAGNOSIS — Z888 Allergy status to other drugs, medicaments and biological substances status: Secondary | ICD-10-CM | POA: Insufficient documentation

## 2021-09-05 DIAGNOSIS — M79602 Pain in left arm: Secondary | ICD-10-CM | POA: Insufficient documentation

## 2021-09-05 DIAGNOSIS — Z79899 Other long term (current) drug therapy: Secondary | ICD-10-CM | POA: Diagnosis not present

## 2021-09-05 DIAGNOSIS — I2699 Other pulmonary embolism without acute cor pulmonale: Secondary | ICD-10-CM | POA: Diagnosis not present

## 2021-09-05 DIAGNOSIS — Z7901 Long term (current) use of anticoagulants: Secondary | ICD-10-CM | POA: Diagnosis not present

## 2021-09-05 DIAGNOSIS — Z880 Allergy status to penicillin: Secondary | ICD-10-CM | POA: Diagnosis not present

## 2021-09-05 DIAGNOSIS — D751 Secondary polycythemia: Secondary | ICD-10-CM

## 2021-09-05 DIAGNOSIS — R519 Headache, unspecified: Secondary | ICD-10-CM | POA: Insufficient documentation

## 2021-09-05 DIAGNOSIS — M79604 Pain in right leg: Secondary | ICD-10-CM | POA: Diagnosis not present

## 2021-09-05 DIAGNOSIS — Z86711 Personal history of pulmonary embolism: Secondary | ICD-10-CM | POA: Diagnosis not present

## 2021-09-05 LAB — CBC WITH DIFFERENTIAL (CANCER CENTER ONLY)
Abs Immature Granulocytes: 0.11 10*3/uL — ABNORMAL HIGH (ref 0.00–0.07)
Basophils Absolute: 0.1 10*3/uL (ref 0.0–0.1)
Basophils Relative: 1 %
Eosinophils Absolute: 0.2 10*3/uL (ref 0.0–0.5)
Eosinophils Relative: 3 %
HCT: 50.3 % — ABNORMAL HIGH (ref 36.0–46.0)
Hemoglobin: 16.2 g/dL — ABNORMAL HIGH (ref 12.0–15.0)
Immature Granulocytes: 1 %
Lymphocytes Relative: 8 %
Lymphs Abs: 0.6 10*3/uL — ABNORMAL LOW (ref 0.7–4.0)
MCH: 31.6 pg (ref 26.0–34.0)
MCHC: 32.2 g/dL (ref 30.0–36.0)
MCV: 98.1 fL (ref 80.0–100.0)
Monocytes Absolute: 0.7 10*3/uL (ref 0.1–1.0)
Monocytes Relative: 9 %
Neutro Abs: 6.3 10*3/uL (ref 1.7–7.7)
Neutrophils Relative %: 78 %
Platelet Count: 455 10*3/uL — ABNORMAL HIGH (ref 150–400)
RBC: 5.13 MIL/uL — ABNORMAL HIGH (ref 3.87–5.11)
RDW: 13.3 % (ref 11.5–15.5)
WBC Count: 8 10*3/uL (ref 4.0–10.5)
nRBC: 0 % (ref 0.0–0.2)

## 2021-09-05 LAB — RETICULOCYTES
Immature Retic Fract: 7 % (ref 2.3–15.9)
RBC.: 5.07 MIL/uL (ref 3.87–5.11)
Retic Count, Absolute: 73 10*3/uL (ref 19.0–186.0)
Retic Ct Pct: 1.4 % (ref 0.4–3.1)

## 2021-09-05 LAB — CMP (CANCER CENTER ONLY)
ALT: 9 U/L (ref 0–44)
AST: 18 U/L (ref 15–41)
Albumin: 4.2 g/dL (ref 3.5–5.0)
Alkaline Phosphatase: 47 U/L (ref 38–126)
Anion gap: 4 — ABNORMAL LOW (ref 5–15)
BUN: 12 mg/dL (ref 8–23)
CO2: 35 mmol/L — ABNORMAL HIGH (ref 22–32)
Calcium: 9.9 mg/dL (ref 8.9–10.3)
Chloride: 98 mmol/L (ref 98–111)
Creatinine: 0.78 mg/dL (ref 0.44–1.00)
GFR, Estimated: >60 mL/min (ref >60)
Glucose, Bld: 94 mg/dL (ref 70–99)
Potassium: 4.1 mmol/L (ref 3.5–5.1)
Sodium: 137 mmol/L (ref 135–145)
Total Bilirubin: 1.5 mg/dL — ABNORMAL HIGH (ref 0.3–1.2)
Total Protein: 7.1 g/dL (ref 6.5–8.1)

## 2021-09-05 LAB — SAVE SMEAR(SSMR), FOR PROVIDER SLIDE REVIEW

## 2021-09-05 LAB — IRON AND IRON BINDING CAPACITY (CC-WL,HP ONLY)
Iron: 110 ug/dL (ref 28–170)
Saturation Ratios: 31 % (ref 10.4–31.8)
TIBC: 356 ug/dL (ref 250–450)
UIBC: 246 ug/dL (ref 148–442)

## 2021-09-05 LAB — LACTATE DEHYDROGENASE: LDH: 170 U/L (ref 98–192)

## 2021-09-05 LAB — FERRITIN: Ferritin: 67 ng/mL (ref 11–307)

## 2021-09-05 NOTE — Progress Notes (Signed)
?Hematology and Oncology Follow Up Visit ? ?[name] ?[id number] ?02/16/1930 88 y.o. ?09/05/2021 ? ? ?Principle Diagnosis:  ?Polycythemia vera - JAK2 positive ?Stage I (T1bN0M0 ) infiltrating duct carcinoma the left breast  ?Iron deficiency anemia -- symptomatic ?Pulmonary embolism-11/30/2020 ?  ?Past Therapy: ?Femara 2.5 mg by mouth daily - stopped 08/2015 ?Hydrea 500 mg by mouth daily -- re-started on 07/16/2019 --DC on 02/15/2021 ?Xarelto 15 mg p.o. daily-change from Eliquis on 02/15/2021 -- d/c on 04/28/2021  ?  ?Current Therapy:        ?Phlebotomy to maintain hematocrit below 45% ?IV Iron as needed ?EC ASA 162 mg po q day -- start on 05/10/2021 ?  ?Interim History:  [name] is here today for follow-up and phlebotomy. She is doing fairly well. She states that she has had several falls but none recently.  ?No syncope.  ?She notes some fatigue at times.  ?She has pain in the left arm due to a sebaceous cyst as well as pain in the right leg due to a Baker's cyst.   ?She ambulates with a cane now for added support.  ?No fever, chills, n/v, cough, rash, dizziness, SOB, chest pain, palpitations, abdominal pain or changes in bowel or bladder habits.  ?She bruises easily on aspirin and states that she has only bee taking one a day.  ?No abnormal blood loss noted. No petechiae.  ?She has the occasional headache. This comes and goes.  ?She states that she is eating well and needs to pick up some more Boost from the store. She does feel that she is staying well hydrated. Her weight is 117 lbs.  ? ?ECOG Performance Status: 2 - Symptomatic, <50% confined to bed ? ?Medications:  ?Allergies as of 09/05/2021   ? ?   Reactions  ? Penicillins Hives, Rash, Other (See Comments)  ? Has patient had a PCN reaction causing immediate rash, facial/tongue/throat swelling, SOB or lightheadedness with hypotension: Yes ?Has patient had a PCN reaction causing severe rash involving mucus membranes or skin necrosis: yes ?Has patient  had a PCN reaction that required hospitalization: No ?Has patient had a PCN reaction occurring within the last 10 years: no ?If all of the above answers are "NO", then may proceed with Cephalosporin use. ?Other Reaction: OTHER REACTION  ? Valsartan Itching, Other (See Comments)  ? Atorvastatin Diarrhea  ? Carvedilol Other (See Comments)  ? "teeth hurt when I took it"  ? Ezetimibe Nausea Only  ? Fluvastatin Sodium Other (See Comments)  ? "Can't remember"  ? Magnesium Hydroxide Other (See Comments)  ? REACTION: triggers HAs  ? Meloxicam Other (See Comments)  ? Pt seeing auro's / spots.    ? Pneumovax [pneumococcal Polysaccharide Vaccine] Other (See Comments)  ? Local reaction  ? Quinapril Hcl Other (See Comments)  ?  "feelings of tiredness"  ? Simvastatin Diarrhea  ? Topamax [topiramate] Itching  ? Vit D-vit E-safflower Oil Other (See Comments)  ? Headaches  ? ?  ? ?  ?Medication List  ?  ? ?  ? Accurate as of September 05, 2021 12:17 PM. If you have any questions, ask your nurse or doctor.  ?  ?  ? ?  ? ?acetaminophen 500 MG tablet ?Commonly known as: TYLENOL ?Take 500 mg by mouth every 6 (six) hours as needed for mild pain. ?  ?alendronate 70 MG tablet ?Commonly known as: FOSAMAX ?TAKE 1 TABLET(70 MG) BY MOUTH EVERY 7 DAYS WITH A FULL GLASS OF WATER AND ON AN  EMPTY STOMACH ?  ?aspirin EC 81 MG tablet ?Take 81 mg by mouth daily. Swallow whole. ?  ?betamethasone dipropionate 0.05 % cream ?Apply on to the skin 2 (two) times daily. ?  ?furosemide 20 MG tablet ?Commonly known as: LASIX ?Take 1 tablet (20 mg total) by mouth every other day. ?  ?HYDROcodone-acetaminophen 5-325 MG tablet ?Commonly known as: NORCO/VICODIN ?Take 1 tablet by mouth daily as needed for moderate pain. ?  ?lactose free nutrition Liqd ?Take 237 mLs by mouth daily. ?  ?LORazepam 0.5 MG tablet ?Commonly known as: ATIVAN ?TAKE 1 TABLET(0.5 MG) BY MOUTH TWICE DAILY AS NEEDED FOR ANXIETY ?  ?metoprolol tartrate 50 MG tablet ?Commonly known as:  LOPRESSOR ?TAKE 1 TABLET(50 MG) BY MOUTH TWICE DAILY ?  ?nitroGLYCERIN 0.4 MG SL tablet ?Commonly known as: NITROSTAT ?Place 1 tablet (0.4 mg total) under the tongue every 5 (five) minutes x 3 doses as needed for chest pain. ?  ?omeprazole 20 MG capsule ?Commonly known as: PRILOSEC ?Take 1 capsule (20 mg total) by mouth daily. ?  ?valACYclovir 500 MG tablet ?Commonly known as: VALTREX ?TAKE 2 TABLETS BY MOUTH AT ONSET OF RASH AND 2 TABLETS 12 HOURS LATER AS NEEDED ?  ? ?  ? ? ?Allergies:  ?Allergies  ?Allergen Reactions  ? Penicillins Hives, Rash and Other (See Comments)  ?  Has patient had a PCN reaction causing immediate rash, facial/tongue/throat swelling, SOB or lightheadedness with hypotension: Yes ?Has patient had a PCN reaction causing severe rash involving mucus membranes or skin necrosis: yes ?Has patient had a PCN reaction that required hospitalization: No ?Has patient had a PCN reaction occurring within the last 10 years: no ?If all of the above answers are "NO", then may proceed with Cephalosporin use. ? ?Other Reaction: OTHER REACTION  ? Valsartan Itching and Other (See Comments)  ? Atorvastatin Diarrhea  ? Carvedilol Other (See Comments)  ?  "teeth hurt when I took it" ?  ? Ezetimibe Nausea Only  ? Fluvastatin Sodium Other (See Comments)  ?  "Can't remember"  ? Magnesium Hydroxide Other (See Comments)  ?  REACTION: triggers HAs  ? Meloxicam Other (See Comments)  ?  Pt seeing auro's / spots.    ? Pneumovax [Pneumococcal Polysaccharide Vaccine] Other (See Comments)  ?  Local reaction  ? Quinapril Hcl Other (See Comments)  ?   "feelings of tiredness"  ? Simvastatin Diarrhea  ? Topamax [Topiramate] Itching  ? Vit D-Vit E-Safflower Oil Other (See Comments)  ?  Headaches  ? ? ?Past Medical History, Surgical history, Social history, and Family History were reviewed and updated. ? ?Review of Systems: ?All other 10 point review of systems is negative.  ? ?Physical Exam: ? vitals were not taken for this visit.   ? ?Wt Readings from Last 3 Encounters:  ?08/29/21 121 lb 2 oz (54.9 kg)  ?07/21/21 117 lb (53.1 kg)  ?07/04/21 115 lb 8 oz (52.4 kg)  ? ? ?Ocular: Sclerae unicteric, pupils equal, round and reactive to light ?Ear-nose-throat: Oropharynx clear, dentition fair ?Lymphatic: No cervical or supraclavicular adenopathy ?Lungs no rales or rhonchi, good excursion bilaterally ?Heart regular rate and rhythm, no murmur appreciated ?Abd soft, nontender, positive bowel sounds ?MSK no focal spinal tenderness, no joint edema ?Neuro: non-focal, well-oriented, appropriate affect ?Breasts: Deferred  ? ?Lab Results  ?Component Value Date  ? WBC 8.0 09/05/2021  ? HGB 16.2 (H) 09/05/2021  ? HCT 50.3 (H) 09/05/2021  ? MCV 98.1 09/05/2021  ? PLT 455 (H)  09/05/2021  ? ?Lab Results  ?Component Value Date  ? FERRITIN 442 (H) 02/15/2021  ? IRON 112 02/15/2021  ? TIBC 260 02/15/2021  ? UIBC 148 02/15/2021  ? IRONPCTSAT 43 02/15/2021  ? ?Lab Results  ?Component Value Date  ? RETICCTPCT 1.4 09/05/2021  ? RBC 5.07 09/05/2021  ? RETICCTABS 41.4 02/02/2015  ? ?No results found for: KPAFRELGTCHN, LAMBDASER, KAPLAMBRATIO ?No results found for: IGGSERUM, IGA, IGMSERUM ?No results found for: TOTALPROTELP, ALBUMINELP, A1GS, A2GS, BETS, BETA2SER, GAMS, MSPIKE, SPEI ?  Chemistry   ?   ?Component Value Date/Time  ? NA 137 09/05/2021 1138  ? NA 139 03/22/2021 1453  ? NA 142 03/25/2017 1457  ? NA 139 04/10/2016 0855  ? K 4.1 09/05/2021 1138  ? K 4.1 03/25/2017 1457  ? K 4.2 04/10/2016 0855  ? CL 98 09/05/2021 1138  ? CL 96 (L) 03/25/2017 1457  ? CO2 35 (H) 09/05/2021 1138  ? CO2 32 03/25/2017 1457  ? CO2 29 04/10/2016 0855  ? BUN 12 09/05/2021 1138  ? BUN 9 03/22/2021 1453  ? BUN 7 03/25/2017 1457  ? BUN 10.0 04/10/2016 0855  ? CREATININE 0.78 09/05/2021 1138  ? CREATININE 0.55 (L) 04/22/2020 1444  ? CREATININE 0.8 04/10/2016 0855  ?    ?Component Value Date/Time  ? CALCIUM 9.9 09/05/2021 1138  ? CALCIUM 9.8 03/25/2017 1457  ? CALCIUM 9.5 04/10/2016 0855  ?  ALKPHOS 47 09/05/2021 1138  ? ALKPHOS 86 (H) 03/25/2017 1457  ? ALKPHOS 85 04/10/2016 0855  ? AST 18 09/05/2021 1138  ? AST 19 04/10/2016 0855  ? ALT 9 09/05/2021 1138  ? ALT 15 03/25/2017 1457  ? ALT 13 04/10/2016 08

## 2021-09-05 NOTE — Patient Instructions (Signed)

## 2021-09-05 NOTE — Telephone Encounter (Signed)
Upon discharge pt told staff member she forgot how to get home the last time she was here and [place] the [place]. ?Called pt daughter, no answer. LMOVM for her with above concern regarding pt. ? ?Called pt grandson who advised pt has been asked not to drive herself places, refuses the transportation offered by facility. [name] (grandson) verbalized he would call and check on pt. He thanked me for the call. No other concerns at this time. ?

## 2021-09-05 NOTE — Progress Notes (Signed)
Vss 1445. Pt discharged. ?

## 2021-09-05 NOTE — Progress Notes (Signed)
An 18G needle inserted to patient's left AC. Phlebotomized 420 mls only. Patient tolerated well. Refreshments given post phlebotomy.  ?

## 2021-09-11 NOTE — Progress Notes (Deleted)
Cardiology Office Note:    Date:  09/11/2021   ID:  [name], DOB 1929-11-01, MRN [id number]  PCP:  Colon Branch, MD  Cardiologist:  Shirlee More, MD    Referring MD: Colon Branch, MD    ASSESSMENT:    No diagnosis found. PLAN:    In order of problems listed above:  ***   Next appointment: ***   Medication Adjustments/Labs and Tests Ordered: Current medicines are reviewed at length with the patient today.  Concerns regarding medicines are outlined above.  No orders of the defined types were placed in this encounter.  No orders of the defined types were placed in this encounter.   No chief complaint on file.   History of Present Illness:    [name] is a 88 y.o. female with a hx of Takotsubo cardiomyopathy in 1990 with normal coronary arteriography, repeat coronary arteriography in 2003 with mild nonobstructive CAD, cardiomyopathy initially EF 40% subsequently recovered in January 2020 at 60% history of stroke venous thromboembolism polycythemia vera chronic diastolic heart failure and pulmonary embolism last seen 03/09/2021. Compliance with diet, lifestyle and medications: *** Past Medical History:  Diagnosis Date   CAD (coronary artery disease)    mild nonobstructive disease on cath in 2003   Cancer [facility])    Cardiomyopathy    Probable Takotsubo, severe CP w/ normal cath in 1994. Severe CP in 2003 w/ widespread T wave inversions on ECG. Cath w/ minimal coronary disease but LV-gram showed periapical severe hypokinesis and basilar hyperkinesia (EF 40%). Last echo in 4/09 confirmed full LV functional recovery with EF 60%, no regional wall motion abnormalities, mild to moderate LVH.   Chronic diastolic CHF (congestive heart failure) ([place]) 09/13/2019   CVA (cerebral infarction)    Depression with anxiety 03/29/2011   lost husband 3'09   DVT (deep venous thrombosis) ([place])    after venous ablation, R leg   Fracture 09/10/2007   L2, status post  vertebroplasty of L2 performed by IR   GERD (gastroesophageal reflux disease)    Hemorrhoids    Hiatal hernia 03/29/2011   no nerve problems   Hyperlipidemia    Hyperplastic colon polyp 06/2001   Hypertension    Iron deficiency anemia due to chronic blood loss 12/30/2017   OA (osteoarthritis) of knee 03/29/2011   w/ bilateral knee pain-not a problem now   OSA (obstructive sleep apnea) 03/29/2011   no cpap used, not a problem now.   Osteoporosis    Otalgia of both ears    Dr. Simeon Craft   Polycythemia    Stroke [facility]) 03/29/2011   CVA x2 -last 10'12-?TIA(visual problems)   Varicose vein     Past Surgical History:  Procedure Laterality Date   APPENDECTOMY     BACK SURGERY     BREAST BIOPSY     BREAST CYST EXCISION     BREAST LUMPECTOMY Left 2013   left stage I left breast cancer   CHOLECYSTECTOMY  04/09/2011   Procedure: LAPAROSCOPIC CHOLECYSTECTOMY WITH INTRAOPERATIVE CHOLANGIOGRAM;  Surgeon: Odis Hollingshead, MD;  Location: [facility];  Service: General;  Laterality: N/A;   Dental Extraction     L maxillary molar   INTRAMEDULLARY (IM) NAIL INTERTROCHANTERIC Left 12/25/2019   Procedure: INTRAMEDULLARY (IM) NAIL INTERTROCHANTRIC;  Surgeon: Paralee Cancel, MD;  Location: [facility];  Service: Orthopedics;  Laterality: Left;   IR KYPHO THORACIC WITH BONE BIOPSY  09/15/2019   IR KYPHO THORACIC WITH BONE BIOPSY  07/13/2020  IR KYPHO THORACIC WITH BONE BIOPSY  07/13/2020   IR VERTEBROPLASTY CERV/THOR BX INC UNI/BIL INC/INJECT/IMAGING  11/10/2019   KYPHOPLASTY N/A 11/16/2020   Procedure: T11,  L1,KYPHOPLASTY;  Surgeon: Melina Schools, MD;  Location: [place];  Service: Orthopedics;  Laterality: N/A;   KYPHOSIS SURGERY  08/2007   cement used   OVARIAN CYST SURGERY     left   RADIOLOGY WITH ANESTHESIA N/A 11/10/2019   Procedure: VERTEBROPLASTY;  Surgeon: Luanne Bras, MD;  Location: [place];  Service: Radiology;  Laterality: N/A;   TONSILLECTOMY     TOTAL KNEE ARTHROPLASTY  06/2010   left   TUBAL  LIGATION      Current Medications: No outpatient medications have been marked as taking for the 09/12/21 encounter (Appointment) with Richardo Priest, MD.     Allergies:   Penicillins, Valsartan, Atorvastatin, Carvedilol, Ezetimibe, Fluvastatin sodium, Magnesium hydroxide, Meloxicam, Pneumovax [pneumococcal polysaccharide vaccine], Quinapril hcl, Simvastatin, Topamax [topiramate], and Vit d-vit e-safflower oil   Social History   Socioeconomic History   Marital status: Widowed    Spouse name: Not on file   Number of children: 3   Years of education: Not on file   Highest education level: Not on file  Occupational History   Occupation: n/a  Tobacco Use   Smoking status: Some Days    Packs/day: 1.00    Years: 37.00    Pack years: 37.00    Types: Cigarettes    Start date: 06/26/1951   Smokeless tobacco: Never   Tobacco comments:    quit 25 years ago  Vaping Use   Vaping Use: Never used  Substance and Sexual Activity   Alcohol use: Not Currently    Alcohol/week: 1.0 standard drink    Types: 1 Glasses of wine per week   Drug use: No   Sexual activity: Not Currently  Other Topics Concern   Not on file  Social History Narrative   Lost husband 07/29/07-    Lives in [place]   has 3 daughters, lost Mickel Baas (03-2017)   Moved to ALF 04-2021   Early Chars lives in [place], [place] ( [place])     Not driving as of [phone]            Social Determinants of Health   Financial Resource Strain: Low Risk    Difficulty of Paying Living Expenses: Not hard at all  Food Insecurity: Not on file  Transportation Needs: No Transportation Needs   Lack of Transportation (Medical): No   Lack of Transportation (Non-Medical): No  Physical Activity: Not on file  Stress: Not on file  Social Connections: Not on file     Family History: The patient's ***family history includes Breast cancer in her mother and sister; Colon cancer in an other family member; Heart disease in her father; Leukemia in her  brother. ROS:   Please see the history of present illness.    All other systems reviewed and are negative.  EKGs/Labs/Other Studies Reviewed:    The following studies were reviewed today:  EKG:  EKG ordered today and personally reviewed.  The ekg ordered today demonstrates ***  Recent Labs: 03/22/2021: NT-Pro BNP 704 09/05/2021: ALT 9; BUN 12; Creatinine 0.78; Hemoglobin 16.2; Platelet Count 455; Potassium 4.1; Sodium 137  Recent Lipid Panel    Component Value Date/Time   CHOL 212 (H) 03/19/2014 1141   TRIG 110.0 03/19/2014 1141   HDL 47.20 03/19/2014 1141   CHOLHDL 4 03/19/2014 1141   VLDL 22.0 03/19/2014 1141  LDLCALC 143 (H) 03/19/2014 1141   LDLDIRECT 142.3 06/25/2011 0954    Physical Exam:    VS:  There were no vitals taken for this visit.    Wt Readings from Last 3 Encounters:  09/05/21 117 lb 1.9 oz (53.1 kg)  08/29/21 121 lb 2 oz (54.9 kg)  07/21/21 117 lb (53.1 kg)     GEN: *** Well nourished, well developed in no acute distress HEENT: Normal NECK: No JVD; No carotid bruits LYMPHATICS: No lymphadenopathy CARDIAC: ***RRR, no murmurs, rubs, gallops RESPIRATORY:  Clear to auscultation without rales, wheezing or rhonchi  ABDOMEN: Soft, non-tender, non-distended MUSCULOSKELETAL:  No edema; No deformity  SKIN: Warm and dry NEUROLOGIC:  Alert and oriented x 3 PSYCHIATRIC:  Normal affect    Signed, Shirlee More, MD  09/11/2021 8:56 PM    [place]

## 2021-09-12 ENCOUNTER — Ambulatory Visit: Payer: Medicare Other | Admitting: Cardiology

## 2021-09-14 ENCOUNTER — Telehealth: Payer: Self-pay | Admitting: *Deleted

## 2021-09-14 NOTE — Telephone Encounter (Signed)
Per 09/05/21 los - called and gave upcoming appointments - confirmed ?

## 2021-09-19 ENCOUNTER — Other Ambulatory Visit: Payer: Medicare Other

## 2021-09-19 ENCOUNTER — Ambulatory Visit (INDEPENDENT_AMBULATORY_CARE_PROVIDER_SITE_OTHER): Payer: Medicare Other | Admitting: Internal Medicine

## 2021-09-19 ENCOUNTER — Ambulatory Visit: Payer: Medicare Other | Admitting: Hematology & Oncology

## 2021-09-19 ENCOUNTER — Encounter: Payer: Self-pay | Admitting: Internal Medicine

## 2021-09-19 VITALS — BP 136/68 | HR 96 | Temp 97.9°F | Resp 18 | Ht 66.0 in | Wt 117.4 lb

## 2021-09-19 DIAGNOSIS — D751 Secondary polycythemia: Secondary | ICD-10-CM

## 2021-09-19 DIAGNOSIS — I872 Venous insufficiency (chronic) (peripheral): Secondary | ICD-10-CM | POA: Diagnosis not present

## 2021-09-19 DIAGNOSIS — L729 Follicular cyst of the skin and subcutaneous tissue, unspecified: Secondary | ICD-10-CM | POA: Diagnosis not present

## 2021-09-19 NOTE — Patient Instructions (Addendum)
We are referring you to a plastic surgeon for possible removal of the cyst in your shoulder ? ? ?Schedule a visit with me for September 2023 ? ? ?Please schedule a Medicare Wellness visit.  ? ?Recommend to proceed with the following vaccines at your pharmacy: ?Covid booster (bivalent) ?Shingrix (shingles) ? ? ?

## 2021-09-19 NOTE — Progress Notes (Signed)
? ?Subjective:  ? ? Patient ID: [name], female    DOB: Dec 17, 1929, 88 y.o.   MRN: [id number] ? ?DOS:  09/19/2021 ?Type of visit - description: Follow-up ? ?Since the last visit, saw hematology, note reviewed. ?Continue to be concerned about the cyst in the left shoulder.  Would like it removed.  It is causing pain but she denies any bleeding or discharge ?She is concerned about the discoloration of her lower extremities. ?She also expressed to me that she is frustrated because she has not feel well in years. ? ?Review of Systems ?See above  ? ?Past Medical History:  ?Diagnosis Date  ? CAD (coronary artery disease)   ? mild nonobstructive disease on cath in 2003  ? Cancer [facility])   ? Cardiomyopathy   ? Probable Takotsubo, severe CP w/ normal cath in 1994. Severe CP in 2003 w/ widespread T wave inversions on ECG. Cath w/ minimal coronary disease but LV-gram showed periapical severe hypokinesis and basilar hyperkinesia (EF 40%). Last echo in 4/09 confirmed full LV functional recovery with EF 60%, no regional wall motion abnormalities, mild to moderate LVH.  ? Chronic diastolic CHF (congestive heart failure) ([place]) 09/13/2019  ? CVA (cerebral infarction)   ? Depression with anxiety 03/29/2011  ? lost husband 3'09  ? DVT (deep venous thrombosis) ([place])   ? after venous ablation, R leg  ? Fracture 09/10/2007  ? L2, status post vertebroplasty of L2 performed by IR  ? GERD (gastroesophageal reflux disease)   ? Hemorrhoids   ? Hiatal hernia 03/29/2011  ? no nerve problems  ? Hyperlipidemia   ? Hyperplastic colon polyp 06/2001  ? Hypertension   ? Iron deficiency anemia due to chronic blood loss 12/30/2017  ? OA (osteoarthritis) of knee 03/29/2011  ? w/ bilateral knee pain-not a problem now  ? OSA (obstructive sleep apnea) 03/29/2011  ? no cpap used, not a problem now.  ? Osteoporosis   ? Otalgia of both ears   ? Dr. Simeon Craft  ? Polycythemia   ? Stroke ([place]) 03/29/2011  ? CVA x2 -last 10'12-?TIA(visual problems)  ? Varicose  vein   ? ? ?Past Surgical History:  ?Procedure Laterality Date  ? APPENDECTOMY    ? BACK SURGERY    ? BREAST BIOPSY    ? BREAST CYST EXCISION    ? BREAST LUMPECTOMY Left 2013  ? left stage I left breast cancer  ? CHOLECYSTECTOMY  04/09/2011  ? Procedure: LAPAROSCOPIC CHOLECYSTECTOMY WITH INTRAOPERATIVE CHOLANGIOGRAM;  Surgeon: Odis Hollingshead, MD;  Location: [facility];  Service: General;  Laterality: N/A;  ? Dental Extraction    ? L maxillary molar  ? INTRAMEDULLARY (IM) NAIL INTERTROCHANTERIC Left 12/25/2019  ? Procedure: INTRAMEDULLARY (IM) NAIL INTERTROCHANTRIC;  Surgeon: Paralee Cancel, MD;  Location: [facility];  Service: Orthopedics;  Laterality: Left;  ? IR KYPHO THORACIC WITH BONE BIOPSY  09/15/2019  ? IR KYPHO THORACIC WITH BONE BIOPSY  07/13/2020  ?    ? IR KYPHO THORACIC WITH BONE BIOPSY  07/13/2020  ? IR VERTEBROPLASTY CERV/THOR BX INC UNI/BIL INC/INJECT/IMAGING  11/10/2019  ? KYPHOPLASTY N/A 11/16/2020  ? Procedure: T11,  L1,KYPHOPLASTY;  Surgeon: Melina Schools, MD;  Location: [facility];  Service: Orthopedics;  Laterality: N/A;  ? KYPHOSIS SURGERY  08/2007  ? cement used  ? OVARIAN CYST SURGERY    ? left  ? RADIOLOGY WITH ANESTHESIA N/A 11/10/2019  ? Procedure: VERTEBROPLASTY;  Surgeon: Luanne Bras, MD;  Location: [place];  Service: Radiology;  Laterality:  N/A;  ? TONSILLECTOMY    ? TOTAL KNEE ARTHROPLASTY  06/2010  ? left  ? TUBAL LIGATION    ? ? ?Current Outpatient Medications  ?Medication Instructions  ? acetaminophen (TYLENOL) 500 mg, Oral, Every 6 hours PRN  ? alendronate (FOSAMAX) 70 MG tablet TAKE 1 TABLET(70 MG) BY MOUTH EVERY 7 DAYS WITH A FULL GLASS OF WATER AND ON AN EMPTY STOMACH  ? aspirin EC 81 mg, Oral, Daily, Swallow whole.  ? betamethasone dipropionate 0.05 % cream Apply on to the skin 2 (two) times daily.  ? furosemide (LASIX) 20 mg, Oral, Every other day  ? HYDROcodone-acetaminophen (NORCO/VICODIN) 5-325 MG tablet 1 tablet, Oral, Daily PRN  ? lactose free nutrition (BOOST) LIQD 237 mLs, Oral, Daily  ?  LORazepam (ATIVAN) 0.5 MG tablet TAKE 1 TABLET(0.5 MG) BY MOUTH TWICE DAILY AS NEEDED FOR ANXIETY  ? metoprolol tartrate (LOPRESSOR) 50 MG tablet TAKE 1 TABLET(50 MG) BY MOUTH TWICE DAILY  ? nitroGLYCERIN (NITROSTAT) 0.4 mg, Sublingual, Every 5 min x3 PRN  ? omeprazole (PRILOSEC) 20 mg, Oral, Daily  ? valACYclovir (VALTREX) 500 MG tablet TAKE 2 TABLETS BY MOUTH AT ONSET OF RASH AND 2 TABLETS 12 HOURS LATER AS NEEDED  ? ? ?   ?Objective:  ? Physical Exam ?BP 136/68 (BP Location: Right Arm, Patient Position: Sitting, Cuff Size: Small)   Pulse 96   Temp 97.9 ?F (36.6 ?C) (Oral)   Resp 18   Ht '5\' 6"'$  (1.676 m)   Wt 117 lb 6 oz (53.2 kg)   SpO2 93%   BMI 18.94 kg/m?  ?General:   ?Well developed, NAD, BMI noted. ?HEENT:  ?Normocephalic . Face symmetric, atraumatic ?Lungs:  ?CTA B ?Normal respiratory effort, no intercostal retractions, no accessory muscle use. ?Heart: RRR,  no murmur.  ?Lower extremities: No edema, skin is hyperpigmented, no warmness, has multiple varicose veins throughout including her feet. ?Good pedal pulses.  No openings. ?Skin: Has subcu mass at the left deltoid consistent with a cyst. ?Neurologic:  ?alert & oriented X3.  ?Speech normal, gait assisted by a cane ?Psych--  ?Cognition and judgment appear intact.  ?Cooperative with normal attention span and concentration.  ?Behavior appropriate. ?No anxious or depressed appearing.  ? ?   ?Assessment   ? ?ASSESSMENT ?HTN ?Dyslipidemia: Intolerant to most medicines including Zetia, Pravachol, Niaspan ?Depression, anxiety , insomnia - on ativan bid (03-2015 citalopram cause drowsiness, 04-2015 prozac:diarrhea) ?Hem-Onc: ?POLYCYTEMIA: sees hem-onc ?H/o breast ca ?MSK: ?---DJD  ?---Osteoporosis:  T score -2.8 (02-2013): declined treatment 06-2017 ?---L2 fractures, s/p vertebral plasty 2009, s/p vertebral plasty T 8 07/2020 ?Migraine headaches - Tylenol #3 (rarely) ?GI: ?GERD- intolerant to prevacid, visual disturbances ?HH, diverticulitis, Colon  polyps ?h/o "functional " LLQ abd pain ?CV: ?---Cardiomyopathy ?Probable Takotsubo cardiomyopathy: CP w/ normal cath in 1994.   ?Severe CP  2003 with widespread T wave inversions on ECG.  Cath with minimal coronary disease but LV-gram showed periapical severe hypokinesis and basilar hyperkinesia (EF 40%) >>> ECHO 4/09 confirmed full LV functional recovery with EF 60%, mild LVH. ?2012: Nl EF per echo, (-) Lexiscan ?05/21/18 ECHO Nl/stable ?---Stroke / TIA 2012 ?---ICB seizure 10/2019 was rx keppra, self d/c 05/2019 ?Sleep apnea 2012, , not on CPAP ?Hematology: ?---H/o DVT, right leg after venous ablation ?--- Pulmonary emboli  dx 12-25-20 ?Raynaud phenomena, DX 05/2018 ?  ?PLAN ?Polycythemia: saw hematology few days ago, phlebotomy was performed ?Sebaceous cyst, L arm: Patient continued to be concerned about it, is causing pain, request excision.  We will refer her to plastic surgery. ?Stasis dermatitis, varicose veins: ?Patient is concerned about leg hyperpigmentation, I believe is stasis- dermatitis d/t varicose veins.  Patient is reassured, encouraged to avoid injuries and use a moisturizer. ?Advanced age: Patient confided to me that she is just frustrated because she has not feel good in years, we both agree that her age has to do with it, denies suicidal ideas.  In the past she told me she is not in good terms with her daughter,if she improves that relationship she overall will feel much better mentally and perhaps even physically.  Encouraged her to open up to her.  She verbalized understanding . ?RTC 01-2022 ?  ?

## 2021-09-20 NOTE — Assessment & Plan Note (Signed)
Polycythemia: saw hematology few days ago, phlebotomy was performed ?Sebaceous cyst, L arm: Patient continued to be concerned about it, is causing pain, request excision.  We will refer her to plastic surgery. ?Stasis dermatitis, varicose veins: ?Patient is concerned about leg hyperpigmentation, I believe is stasis- dermatitis d/t varicose veins.  Patient is reassured, encouraged to avoid injuries and use a moisturizer. ?Advanced age: Patient confided to me that she is just frustrated because she has not feel good in years, we both agree that her age has to do with it, denies suicidal ideas.  In the past she told me she is not in good terms with her daughter,if she improves that relationship she overall will feel much better mentally and perhaps even physically.  Encouraged her to open up to her.  She verbalized understanding . ?RTC 01-2022 ?

## 2021-09-29 ENCOUNTER — Institutional Professional Consult (permissible substitution): Payer: Medicare Other | Admitting: Plastic Surgery

## 2021-09-30 ENCOUNTER — Other Ambulatory Visit: Payer: Self-pay

## 2021-09-30 ENCOUNTER — Emergency Department (HOSPITAL_COMMUNITY)
Admission: EM | Admit: 2021-09-30 | Discharge: 2021-10-01 | Disposition: A | Discharge status: Home / Self Care | Payer: Medicare Other | Attending: Emergency Medicine | Admitting: Emergency Medicine

## 2021-09-30 DIAGNOSIS — R110 Nausea: Secondary | ICD-10-CM | POA: Insufficient documentation

## 2021-09-30 DIAGNOSIS — R1011 Right upper quadrant pain: Secondary | ICD-10-CM | POA: Insufficient documentation

## 2021-09-30 DIAGNOSIS — M8588 Other specified disorders of bone density and structure, other site: Secondary | ICD-10-CM | POA: Diagnosis not present

## 2021-09-30 DIAGNOSIS — M40204 Unspecified kyphosis, thoracic region: Secondary | ICD-10-CM | POA: Diagnosis not present

## 2021-09-30 DIAGNOSIS — M545 Low back pain, unspecified: Secondary | ICD-10-CM | POA: Diagnosis not present

## 2021-09-30 DIAGNOSIS — Z20822 Contact with and (suspected) exposure to covid-19: Secondary | ICD-10-CM | POA: Insufficient documentation

## 2021-09-30 DIAGNOSIS — N3289 Other specified disorders of bladder: Secondary | ICD-10-CM | POA: Diagnosis not present

## 2021-09-30 DIAGNOSIS — Z96652 Presence of left artificial knee joint: Secondary | ICD-10-CM | POA: Diagnosis not present

## 2021-09-30 DIAGNOSIS — I5032 Chronic diastolic (congestive) heart failure: Secondary | ICD-10-CM | POA: Insufficient documentation

## 2021-09-30 DIAGNOSIS — Z79899 Other long term (current) drug therapy: Secondary | ICD-10-CM | POA: Diagnosis not present

## 2021-09-30 DIAGNOSIS — I2510 Atherosclerotic heart disease of native coronary artery without angina pectoris: Secondary | ICD-10-CM | POA: Insufficient documentation

## 2021-09-30 DIAGNOSIS — S22060A Wedge compression fracture of T7-T8 vertebra, initial encounter for closed fracture: Secondary | ICD-10-CM | POA: Diagnosis not present

## 2021-09-30 DIAGNOSIS — R0781 Pleurodynia: Secondary | ICD-10-CM | POA: Diagnosis not present

## 2021-09-30 DIAGNOSIS — I110 Hypertensive heart disease with heart failure: Secondary | ICD-10-CM | POA: Insufficient documentation

## 2021-09-30 DIAGNOSIS — S22069A Unspecified fracture of T7-T8 vertebra, initial encounter for closed fracture: Secondary | ICD-10-CM | POA: Diagnosis not present

## 2021-09-30 DIAGNOSIS — Z743 Need for continuous supervision: Secondary | ICD-10-CM | POA: Diagnosis not present

## 2021-09-30 DIAGNOSIS — F17210 Nicotine dependence, cigarettes, uncomplicated: Secondary | ICD-10-CM | POA: Diagnosis not present

## 2021-09-30 DIAGNOSIS — R6889 Other general symptoms and signs: Secondary | ICD-10-CM | POA: Diagnosis not present

## 2021-09-30 DIAGNOSIS — X500XXA Overexertion from strenuous movement or load, initial encounter: Secondary | ICD-10-CM | POA: Diagnosis not present

## 2021-09-30 DIAGNOSIS — K6389 Other specified diseases of intestine: Secondary | ICD-10-CM | POA: Diagnosis not present

## 2021-09-30 DIAGNOSIS — M549 Dorsalgia, unspecified: Secondary | ICD-10-CM | POA: Diagnosis not present

## 2021-09-30 DIAGNOSIS — Z853 Personal history of malignant neoplasm of breast: Secondary | ICD-10-CM | POA: Insufficient documentation

## 2021-09-30 DIAGNOSIS — Z7982 Long term (current) use of aspirin: Secondary | ICD-10-CM | POA: Diagnosis not present

## 2021-09-30 DIAGNOSIS — Y9389 Activity, other specified: Secondary | ICD-10-CM | POA: Insufficient documentation

## 2021-09-30 DIAGNOSIS — R079 Chest pain, unspecified: Secondary | ICD-10-CM | POA: Diagnosis not present

## 2021-09-30 DIAGNOSIS — S29002A Unspecified injury of muscle and tendon of back wall of thorax, initial encounter: Secondary | ICD-10-CM | POA: Diagnosis present

## 2021-09-30 DIAGNOSIS — K5900 Constipation, unspecified: Secondary | ICD-10-CM | POA: Diagnosis not present

## 2021-09-30 DIAGNOSIS — I10 Essential (primary) hypertension: Secondary | ICD-10-CM | POA: Diagnosis not present

## 2021-09-30 NOTE — ED Triage Notes (Signed)
Pt BIB EMS from [facility]. Pt reports yesterday she was changing sheets on her bed and started to have back pain. Pt reports today pain is getting worse. Pt reports stabbing midline pain that moves upward and to her ribs. Pt reports no SOB, no N/V. Pt has hx of HTN, MI, vertebrae surgeries, hip replacement  VSS with EMS  162/88 HR 76 RR 18 O2 97% RA  CBG 132

## 2021-10-01 ENCOUNTER — Emergency Department (HOSPITAL_COMMUNITY): Payer: Medicare Other

## 2021-10-01 DIAGNOSIS — N3289 Other specified disorders of bladder: Secondary | ICD-10-CM | POA: Diagnosis not present

## 2021-10-01 DIAGNOSIS — K5900 Constipation, unspecified: Secondary | ICD-10-CM | POA: Diagnosis not present

## 2021-10-01 DIAGNOSIS — Z743 Need for continuous supervision: Secondary | ICD-10-CM | POA: Diagnosis not present

## 2021-10-01 DIAGNOSIS — R404 Transient alteration of awareness: Secondary | ICD-10-CM | POA: Diagnosis not present

## 2021-10-01 DIAGNOSIS — M549 Dorsalgia, unspecified: Secondary | ICD-10-CM | POA: Diagnosis not present

## 2021-10-01 DIAGNOSIS — M545 Low back pain, unspecified: Secondary | ICD-10-CM | POA: Diagnosis not present

## 2021-10-01 DIAGNOSIS — R079 Chest pain, unspecified: Secondary | ICD-10-CM | POA: Diagnosis not present

## 2021-10-01 DIAGNOSIS — R110 Nausea: Secondary | ICD-10-CM | POA: Diagnosis not present

## 2021-10-01 DIAGNOSIS — S22069A Unspecified fracture of T7-T8 vertebra, initial encounter for closed fracture: Secondary | ICD-10-CM | POA: Diagnosis not present

## 2021-10-01 DIAGNOSIS — M40204 Unspecified kyphosis, thoracic region: Secondary | ICD-10-CM | POA: Diagnosis not present

## 2021-10-01 DIAGNOSIS — K6389 Other specified diseases of intestine: Secondary | ICD-10-CM | POA: Diagnosis not present

## 2021-10-01 DIAGNOSIS — R279 Unspecified lack of coordination: Secondary | ICD-10-CM | POA: Diagnosis not present

## 2021-10-01 DIAGNOSIS — M8588 Other specified disorders of bone density and structure, other site: Secondary | ICD-10-CM | POA: Diagnosis not present

## 2021-10-01 LAB — CBC
HCT: 45.3 % (ref 36.0–46.0)
Hemoglobin: 14.4 g/dL (ref 12.0–15.0)
MCH: 31.6 pg (ref 26.0–34.0)
MCHC: 31.8 g/dL (ref 30.0–36.0)
MCV: 99.3 fL (ref 80.0–100.0)
Platelets: 405 10*3/uL — ABNORMAL HIGH (ref 150–400)
RBC: 4.56 MIL/uL (ref 3.87–5.11)
RDW: 13.4 % (ref 11.5–15.5)
WBC: 7.5 10*3/uL (ref 4.0–10.5)
nRBC: 0 % (ref 0.0–0.2)

## 2021-10-01 LAB — COMPREHENSIVE METABOLIC PANEL
ALT: 12 U/L (ref 0–44)
AST: 19 U/L (ref 15–41)
Albumin: 3.4 g/dL — ABNORMAL LOW (ref 3.5–5.0)
Alkaline Phosphatase: 46 U/L (ref 38–126)
Anion gap: 6 (ref 5–15)
BUN: 9 mg/dL (ref 8–23)
CO2: 29 mmol/L (ref 22–32)
Calcium: 8.8 mg/dL — ABNORMAL LOW (ref 8.9–10.3)
Chloride: 100 mmol/L (ref 98–111)
Creatinine, Ser: 0.75 mg/dL (ref 0.44–1.00)
GFR, Estimated: >60 mL/min (ref >60)
Glucose, Bld: 115 mg/dL — ABNORMAL HIGH (ref 70–99)
Potassium: 3.9 mmol/L (ref 3.5–5.1)
Sodium: 135 mmol/L (ref 135–145)
Total Bilirubin: 2 mg/dL — ABNORMAL HIGH (ref 0.3–1.2)
Total Protein: 6.3 g/dL — ABNORMAL LOW (ref 6.5–8.1)

## 2021-10-01 LAB — URINALYSIS, ROUTINE W REFLEX MICROSCOPIC
Bilirubin Urine: NEGATIVE
Glucose, UA: NEGATIVE mg/dL
Hgb urine dipstick: NEGATIVE
Ketones, ur: NEGATIVE mg/dL
Leukocytes,Ua: NEGATIVE
Nitrite: NEGATIVE
Protein, ur: NEGATIVE mg/dL
Specific Gravity, Urine: 1.006 (ref 1.005–1.030)
pH: 6 (ref 5.0–8.0)

## 2021-10-01 LAB — RESP PANEL BY RT-PCR (FLU A&B, COVID) ARPGX2
Influenza A by PCR: NEGATIVE
Influenza B by PCR: NEGATIVE
SARS Coronavirus 2 by RT PCR: NEGATIVE

## 2021-10-01 LAB — TROPONIN I (HIGH SENSITIVITY)
Troponin I (High Sensitivity): 6 ng/L (ref <18)
Troponin I (High Sensitivity): 7 ng/L (ref <18)

## 2021-10-01 LAB — LIPASE, BLOOD: Lipase: 32 U/L (ref 11–51)

## 2021-10-01 MED ORDER — HYDROCODONE-ACETAMINOPHEN 5-325 MG PO TABS
1.0000 | ORAL_TABLET | Freq: Once | ORAL | Status: AC
  Administered 2021-10-01: 1 via ORAL
  Filled 2021-10-01: qty 1

## 2021-10-01 MED ORDER — ONDANSETRON HCL 4 MG/2ML IJ SOLN
4.0000 mg | Freq: Once | INTRAMUSCULAR | Status: AC
Start: 2021-10-01 — End: 2021-10-01
  Administered 2021-10-01: 4 mg via INTRAVENOUS
  Filled 2021-10-01: qty 2

## 2021-10-01 MED ORDER — HYDROCODONE-ACETAMINOPHEN 5-325 MG PO TABS: 1.0000 | ORAL_TABLET | ORAL | 0 refills | Status: DC | PRN

## 2021-10-01 MED ORDER — SODIUM CHLORIDE 0.9 % IV BOLUS
500.0000 mL | Freq: Once | INTRAVENOUS | Status: AC
  Administered 2021-10-01: 500 mL via INTRAVENOUS

## 2021-10-01 MED ORDER — IOHEXOL 350 MG/ML SOLN
100.0000 mL | Freq: Once | INTRAVENOUS | Status: AC | PRN
  Administered 2021-10-01: 100 mL via INTRAVENOUS

## 2021-10-01 MED ORDER — LIDOCAINE 5 % EX PTCH
1.0000 | MEDICATED_PATCH | CUTANEOUS | Status: DC
  Administered 2021-10-01: 1 via TRANSDERMAL
  Filled 2021-10-01: qty 1

## 2021-10-01 MED ORDER — FAMOTIDINE IN NACL 20-0.9 MG/50ML-% IV SOLN
20.0000 mg | Freq: Once | INTRAVENOUS | Status: AC
  Administered 2021-10-01: 20 mg via INTRAVENOUS
  Filled 2021-10-01: qty 50

## 2021-10-01 MED ORDER — MORPHINE SULFATE (PF) 2 MG/ML IV SOLN
2.0000 mg | Freq: Once | INTRAVENOUS | Status: AC
  Administered 2021-10-01: 2 mg via INTRAVENOUS
  Filled 2021-10-01: qty 1

## 2021-10-01 NOTE — ED Notes (Signed)
Paged ortho tech 

## 2021-10-01 NOTE — ED Notes (Signed)
Call back from [place] received. Advised he will be at [facility] as soon as possible, currently at [facility].

## 2021-10-01 NOTE — Progress Notes (Signed)
Orthopedic Tech Progress Note Patient Details:  [name] 1930/03/07 [id number]  Ortho Devices Type of Ortho Device: Thoracolumbar corset (TLSO) Ortho Device/Splint Interventions: Ordered, Application, Adjustment   Post Interventions Patient Tolerated: Well Instructions Provided: Care of device, Adjustment of device  Karolee Stamps 10/01/2021, 6:02 AM

## 2021-10-01 NOTE — ED Provider Notes (Addendum)
[facility] EMERGENCY DEPARTMENT Provider Note   CSN: [id number] Arrival date & time: 09/30/21  2330     History  Chief Complaint  Patient presents with   Back Pain    [name] is a 88 y.o. female.   Patient as above with significant medical history as below, including CAD, cardiomyopathy diastolic chf,  polycythemia, cva who presents to the ED with complaint of back pain, nausea, right sided pain.  Patient reports on Thursday she was attempting to make the bed and while she was lifting the mattress she began to experience excruciating pain to her mid back.  Has been ongoing since then.  She was feeling fatigued and some generalized malaise the week leading up to this.  She had some nausea and vomiting Friday which has since improved.  She has mid back pain, right upper quadrant and right flank pain.  No dysuria, hematuria, change in bowel or bladder function.  No further nausea or vomiting.  No significant dyspnea.  No significant chest pain currently.  No medications prior to arrival.  No numbness or tingling that seems new.  No urinary incontinence or overflow.  No saddle paresthesias reported    Past Medical History: No date: CAD (coronary artery disease)     Comment:  mild nonobstructive disease on cath in 2003 No date: Cancer ([place]) No date: Cardiomyopathy     Comment:  Probable Takotsubo, severe CP w/ normal cath in 1994.               Severe CP in 2003 w/ widespread T wave inversions on ECG.              Cath w/ minimal coronary disease but LV-gram showed               periapical severe hypokinesis and basilar hyperkinesia               (EF 40%). Last echo in 4/09 confirmed full LV functional               recovery with EF 60%, no regional wall motion               abnormalities, mild to moderate LVH. 97/98/9211: Chronic diastolic CHF (congestive heart failure) (HCC) No date: CVA (cerebral infarction) 03/29/2011: Depression with anxiety      Comment:  lost husband 3'09 No date: DVT (deep venous thrombosis) ([place])     Comment:  after venous ablation, R leg 09/10/2007: Fracture     Comment:  L2, status post vertebroplasty of L2 performed by IR No date: GERD (gastroesophageal reflux disease) No date: Hemorrhoids 03/29/2011: Hiatal hernia     Comment:  no nerve problems No date: Hyperlipidemia 06/2001: Hyperplastic colon polyp No date: Hypertension 12/30/2017: Iron deficiency anemia due to chronic blood loss 03/29/2011: OA (osteoarthritis) of knee     Comment:  w/ bilateral knee pain-not a problem now 03/29/2011: OSA (obstructive sleep apnea)     Comment:  no cpap used, not a problem now. No date: Osteoporosis No date: Otalgia of both ears     Comment:  Dr. Simeon Craft No date: Polycythemia 03/29/2011: Stroke [facility]     Comment:  CVA x2 -last 10'12-?TIA(visual problems) No date: Varicose vein  Past Surgical History: No date: APPENDECTOMY No date: BACK SURGERY No date: BREAST BIOPSY No date: BREAST CYST EXCISION 2013: BREAST LUMPECTOMY; Left     Comment:  left stage I left breast cancer 04/09/2011: CHOLECYSTECTOMY  Comment:  Procedure: LAPAROSCOPIC CHOLECYSTECTOMY WITH               INTRAOPERATIVE CHOLANGIOGRAM;  Surgeon: Odis Hollingshead, MD;  Location: [facility];  Service: General;                Laterality: N/A; No date: Dental Extraction     Comment:  L maxillary molar 12/25/2019: INTRAMEDULLARY (IM) NAIL INTERTROCHANTERIC; Left     Comment:  Procedure: INTRAMEDULLARY (IM) NAIL INTERTROCHANTRIC;                Surgeon: Paralee Cancel, MD;  Location: [facility];  Service:               Orthopedics;  Laterality: Left; 09/15/2019: IR KYPHO THORACIC WITH BONE BIOPSY 07/13/2020: IR KYPHO THORACIC WITH BONE BIOPSY     Comment:    07/13/2020: IR KYPHO THORACIC WITH BONE BIOPSY 11/10/2019: IR VERTEBROPLASTY CERV/THOR BX INC UNI/BIL INC/INJECT/ IMAGING 11/16/2020: KYPHOPLASTY; N/A     Comment:  Procedure: T11,   L1,KYPHOPLASTY;  Surgeon: Melina Schools, MD;  Location: [place];  Service: Orthopedics;                Laterality: N/A; 08/2007: KYPHOSIS SURGERY     Comment:  cement used No date: OVARIAN CYST SURGERY     Comment:  left 11/10/2019: RADIOLOGY WITH ANESTHESIA; N/A     Comment:  Procedure: VERTEBROPLASTY;  Surgeon: Luanne Bras,              MD;  Location: [place];  Service: Radiology;  Laterality:               N/A; No date: TONSILLECTOMY 06/2010: TOTAL KNEE ARTHROPLASTY     Comment:  left No date: TUBAL LIGATION    The history is provided by the patient. No language interpreter was used.  Back Pain Associated symptoms: abdominal pain   Associated symptoms: no chest pain, no fever and no headaches       Home Medications Prior to Admission medications   Medication Sig Start Date End Date Taking? Authorizing Provider  HYDROcodone-acetaminophen (NORCO/VICODIN) 5-325 MG tablet Take 1 tablet by mouth every 4 (four) hours as needed. 10/01/21  Yes Jeanell Sparrow, DO  acetaminophen (TYLENOL) 500 MG tablet Take 500 mg by mouth every 6 (six) hours as needed for mild pain.    [provider]  alendronate (FOSAMAX) 70 MG tablet TAKE 1 TABLET(70 MG) BY MOUTH EVERY 7 DAYS WITH A FULL GLASS OF WATER AND ON AN EMPTY STOMACH 08/09/21   Colon Branch, MD  aspirin EC 81 MG tablet Take 81 mg by mouth daily. Swallow whole.    [provider]  betamethasone dipropionate 0.05 % cream Apply on to the skin 2 (two) times daily. 06/13/21   Colon Branch, MD  furosemide (LASIX) 20 MG tablet Take 1 tablet (20 mg total) by mouth every other day. 03/09/21   Richardo Priest, MD  HYDROcodone-acetaminophen (NORCO/VICODIN) 5-325 MG tablet Take 1 tablet by mouth daily as needed for moderate pain. Patient not taking: Reported on 08/10/2021 06/05/21   Colon Branch, MD  lactose free nutrition (BOOST) LIQD Take 237 mLs by mouth daily.    [provider]  LORazepam (ATIVAN) 0.5 MG tablet  TAKE 1 TABLET(0.5 MG) BY MOUTH TWICE DAILY AS  NEEDED FOR ANXIETY 08/09/21   Colon Branch, MD  metoprolol tartrate (LOPRESSOR) 50 MG tablet TAKE 1 TABLET(50 MG) BY MOUTH TWICE DAILY 08/08/21   Colon Branch, MD  nitroGLYCERIN (NITROSTAT) 0.4 MG SL tablet Place 1 tablet (0.4 mg total) under the tongue every 5 (five) minutes x 3 doses as needed for chest pain. Patient not taking: Reported on 09/19/2021 07/25/20   Colon Branch, MD  omeprazole (PRILOSEC) 20 MG capsule Take 1 capsule (20 mg total) by mouth daily. 10/05/19   Granville Lewis C, PA-C  valACYclovir (VALTREX) 500 MG tablet TAKE 2 TABLETS BY MOUTH AT ONSET OF RASH AND 2 TABLETS 12 HOURS LATER AS NEEDED 02/17/21 02/17/22  Volanda Napoleon, MD      Allergies    Penicillins, Valsartan, Atorvastatin, Carvedilol, Ezetimibe, Fluvastatin sodium, Magnesium hydroxide, Meloxicam, Pneumovax [pneumococcal polysaccharide vaccine], Quinapril hcl, Simvastatin, Topamax [topiramate], and Vit d-vit e-safflower oil    Review of Systems   Review of Systems  Constitutional:  Positive for fatigue. Negative for chills and fever.  HENT:  Negative for facial swelling and trouble swallowing.   Eyes:  Negative for photophobia and visual disturbance.  Respiratory:  Negative for cough and shortness of breath.   Cardiovascular:  Negative for chest pain and palpitations.  Gastrointestinal:  Positive for abdominal pain, nausea and vomiting.  Endocrine: Negative for polydipsia and polyuria.  Genitourinary:  Negative for difficulty urinating and hematuria.  Musculoskeletal:  Positive for back pain. Negative for gait problem and joint swelling.  Skin:  Negative for pallor and rash.  Neurological:  Negative for syncope and headaches.  Psychiatric/Behavioral:  Negative for agitation and confusion.    Physical Exam Updated Vital Signs BP (!) 178/81   Pulse 66   Temp 98 F (36.7 C)   Resp 17   Ht '5\' 6"'$  (1.676 m)   Wt 53.1 kg   SpO2 99%   BMI 18.88 kg/m  Physical Exam Vitals  and nursing note reviewed.  Constitutional:      General: She is not in acute distress.    Appearance: Normal appearance. She is not ill-appearing or diaphoretic.  HENT:     Head: Normocephalic and atraumatic.     Right Ear: External ear normal.     Left Ear: External ear normal.     Nose: Nose normal.     Mouth/Throat:     Mouth: Mucous membranes are moist.  Eyes:     General: No scleral icterus.       Right eye: No discharge.        Left eye: No discharge.     Extraocular Movements: Extraocular movements intact.     Pupils: Pupils are equal, round, and reactive to light.  Cardiovascular:     Rate and Rhythm: Normal rate and regular rhythm.     Pulses: Normal pulses.     Heart sounds: Normal heart sounds.  Pulmonary:     Effort: Pulmonary effort is normal. No respiratory distress.     Breath sounds: Normal breath sounds.  Abdominal:     General: Abdomen is flat.     Palpations: Abdomen is soft.     Tenderness: There is abdominal tenderness in the right upper quadrant. There is no guarding or rebound.    Musculoskeletal:        General: Normal range of motion.       Arms:     Cervical back: Normal range of motion.     Right lower leg: No  edema.     Left lower leg: No edema.  Skin:    General: Skin is warm and dry.     Capillary Refill: Capillary refill takes less than 2 seconds.  Neurological:     Mental Status: She is alert and oriented to person, place, and time.     GCS: GCS eye subscore is 4. GCS verbal subscore is 5. GCS motor subscore is 6.     Cranial Nerves: Cranial nerves 2-12 are intact. No dysarthria.     Sensory: Sensation is intact.     Motor: Motor function is intact. No tremor.     Coordination: Coordination is intact.  Psychiatric:        Mood and Affect: Mood normal.        Behavior: Behavior normal.    ED Results / Procedures / Treatments   Labs (all labs ordered are listed, but only abnormal results are displayed) Labs Reviewed  CBC -  Abnormal; Notable for the following components:      Result Value   Platelets 405 (*)    All other components within normal limits  COMPREHENSIVE METABOLIC PANEL - Abnormal; Notable for the following components:   Glucose, Bld 115 (*)    Calcium 8.8 (*)    Total Protein 6.3 (*)    Albumin 3.4 (*)    Total Bilirubin 2.0 (*)    All other components within normal limits  RESP PANEL BY RT-PCR (FLU A&B, COVID) ARPGX2  LIPASE, BLOOD  URINALYSIS, ROUTINE W REFLEX MICROSCOPIC  TROPONIN I (HIGH SENSITIVITY)  TROPONIN I (HIGH SENSITIVITY)    EKG EKG Interpretation  Date/Time:  Sunday Oct 01 2021 00:59:42 EDT Ventricular Rate:  65 PR Interval:  211 QRS Duration: 81 QT Interval:  409 QTC Calculation: 426 R Axis:   34 Text Interpretation: Sinus rhythm Probable left atrial enlargement Anterior infarct, old no stemi Confirmed by Wynona Dove ([id number]) on 10/01/2021 3:37:34 AM  Radiology CT T-SPINE NO CHARGE  Result Date: 10/01/2021 CLINICAL DATA:  88 year old female with chest and back pain. EXAM: CT THORACIC SPINE WITH CONTRAST TECHNIQUE: Multiplanar CT images of the thoracic spine were reconstructed from contemporary CT of the Chest. RADIATION DOSE REDUCTION: This exam was performed according to the departmental dose-optimization program which includes automated exposure control, adjustment of the mA and/or kV according to patient size and/or use of iterative reconstruction technique. CONTRAST:  No additional COMPARISON:  CT Chest, Abdomen, and Pelvis today are reported separately. Cervical spine CT 02/19/2020. Prior thoracic spine CT 07/04/2020.  And chest CTA 12/25/2020. FINDINGS: Limited cervical spine imaging: Small C7 cervical ribs confirmed in 2021. Thoracic spine segmentation: Mildly transitional, absent ribs at T12. Correlation with radiographs is recommended prior to any operative intervention. Alignment: Increased exaggeration of thoracic kyphosis since last year. Vertebrae: Diffuse  osteopenia. Stable mild chronic T1 superior endplate compression. Previously augmented thoracic compression fractures at T4 and T9. Interval T8, T10, and T12 compression fractures and augmentation. T2 and T3 appears stable and intact. T5 and T6 appears stable and intact. Mild T7 inferior endplate compression with 20% loss of vertebral body height compared to last year. No retropulsion. T7 pedicles and posterior elements appear intact. T11 inferior endplate sclerosis is new from last February, but stable since August and T11 vertebral body height is maintained. T11 pedicles and posterior elements appear intact. Paraspinal and other soft tissues: Chest and abdominal viscera are detailed separately. Thoracic paraspinal soft tissues remain normal. Disc levels: Above T12 the thoracic spinal canal patency appears  stable from last year. Augmentation of the T8 vertebral body does not appear to of exacerbated retropulsion at that level. But the interval T12 compression fracture demonstrates retropulsion of the posterosuperior endplate, although mild if any thoracic spinal stenosis results at that level. IMPRESSION: 1. Mildly transitional anatomy. Small C7 cervical ribs and absent ribs at T12. 2. Diffuse osteopenia. Mild T7 inferior endplate compression fracture is new since 12/25/2020. 20% loss of height. No retropulsion or spinal stenosis. 3. Chronic T1, T4, T8, T9, T10, and T12 compression fractures - most previously augmented. 4.  CT Chest, Abdomen, and Pelvis today are reported separately. Electronically Signed   By: Genevie Ann M.D.   On: 10/01/2021 04:22   CT L-SPINE NO CHARGE  Result Date: 10/01/2021 CLINICAL DATA:  88 year old female with chest and back pain. EXAM: CT LUMBAR SPINE WITH CONTRAST TECHNIQUE: Technique: Multiplanar CT images of the lumbar spine were reconstructed from contemporary CT of the Abdomen and Pelvis. RADIATION DOSE REDUCTION: This exam was performed according to the departmental  dose-optimization program which includes automated exposure control, adjustment of the mA and/or kV according to patient size and/or use of iterative reconstruction technique. CONTRAST:  No additional COMPARISON:  CT thoracic spine, chest, Abdomen, and Pelvis today reported separately. CT Lumbar spine 10/03/2020. FINDINGS: Segmentation: Transitional anatomy. Absent ribs at T12. Fully sacralized L5 level. This is concordant with prior cervical and today's thoracic spine numbering. Correlation with radiographs is recommended prior to any operative intervention. Alignment: Stable mild straightening of lumbar lordosis. Vertebrae: Diffuse severe osteopenia. Augmented T12 compression fracture is new from the lumbar CT last year. Stable augmented L1 compression fracture since that time. Mild to moderate L2 compression fracture appears stable. Mild L3 compression fracture appears stable. L4 vertebral body height is stable with subtle deformity of the inferior endplate. Posterior elements of those levels appears stable. Completely sacralized L5 level. Grossly stable sacrum. No acute osseous abnormality identified. Paraspinal and other soft tissues: Abdominal and pelvic viscera are detailed separately. Lumbar paraspinal soft tissues remain within normal limits. Disc levels: Lumbar spine degeneration and patency of the lumbar spinal canal appears stable from last year. Mild multifactorial spinal stenosis at L1-L2 in part related to endplate retropulsion. IMPRESSION: 1. Transitional anatomy with absent ribs at T12 and fully sacralized L5 level. Correlation with radiographs is recommended prior to any operative intervention. 2. No acute osseous abnormality. Augmented T12 compression fracture is new from last year, with otherwise stable L1 through L4 compression fractures. 3. Stable lumbar spine degeneration. 4. CTA chest, Abdomen, and Pelvis today are reported separately. Electronically Signed   By: Genevie Ann M.D.   On: 10/01/2021  04:27   DG Chest Port 1 View  Result Date: 10/01/2021 CLINICAL DATA:  Chest pain and nausea. EXAM: PORTABLE CHEST 1 VIEW COMPARISON:  Chest radiograph dated 01/24/2021. FINDINGS: No focal consolidation, pleural effusion or pneumothorax. Stable cardiomegaly. Atherosclerotic calcification of the aorta. Osteopenia with multilevel thoracic vertebroplasty. No acute osseous pathology. IMPRESSION: No acute cardiopulmonary process. Electronically Signed   By: Anner Crete M.D.   On: 10/01/2021 01:08   CT Angio Chest/Abd/Pel for Dissection W and/or Wo Contrast  Result Date: 10/01/2021 CLINICAL DATA:  88 year old female with chest and back pain. EXAM: CT ANGIOGRAPHY CHEST, ABDOMEN AND PELVIS TECHNIQUE: Non-contrast CT of the chest was initially obtained. Multidetector CT imaging through the chest, abdomen and pelvis was performed using the standard protocol during bolus administration of intravenous contrast. Multiplanar reconstructed images and MIPs were obtained and reviewed to evaluate the  vascular anatomy. RADIATION DOSE REDUCTION: This exam was performed according to the departmental dose-optimization program which includes automated exposure control, adjustment of the mA and/or kV according to patient size and/or use of iterative reconstruction technique. CONTRAST:  166m OMNIPAQUE IOHEXOL 350 MG/ML SOLN COMPARISON:  CTA chest, CT Abdomen and Pelvis 12/25/2020. CT thoracic and lumbar spine today are reported separately. FINDINGS: CTA CHEST FINDINGS Cardiovascular: Stable cardiac size since last year, upper limits of normal. Calcified aortic atherosclerosis. Calcified coronary artery atherosclerosis. Tortuous thoracic aorta with no dissection or aneurysm. Major pulmonary arteries today are patent. Resolved right lower lobe posterior basal segment pulmonary embolus demonstrated last year. Mediastinum/Nodes: Stable. Post granulomatous calcified mediastinal and right hilar lymph nodes. Large chronic gastric  hiatal hernia. Lungs/Pleura: Mildly larger, improved lung volumes compared to last year. Major airways remain patent. Chronic left lower lobe atelectasis in association with large hiatal hernia. Linear right lower lobe atelectasis or scarring with improved right costophrenic angle ventilation from last year. Trace lung base pleural fluid. No areas of worsening ventilation. Musculoskeletal: Thoracic spine is detailed separately today. Diffuse osteopenia. Sternum appears stable from last year. Visible shoulder osseous structures appear stable. Small C7 cervical ribs, confirmed on prior cervical spine CT 02/19/2020. Absent ribs at T12. Chronic posterior left 9th rib fracture is stable from last year, but additional lateral left 9th and posterolateral left 8th rib fractures are new although healing. No definite acute rib fracture. Stable left breast surgical clips. Review of the MIP images confirms the above findings. CTA ABDOMEN AND PELVIS FINDINGS VASCULAR Aortoiliac calcified atherosclerosis. Chronically tortuous abdominal aorta (series 11, image 56), negative for aortic dissection or aneurysm. Celiac, SMA, and IMA remain patent. Renal arteries remain patent. Iliac and visible proximal femoral arteries remain patent. Review of the MIP images confirms the above findings. NON-VASCULAR Hepatobiliary: Chronic hepatomegaly, 20 cm liver and the right lobe extends into the right hemipelvis as before. Chronically absent gallbladder. Bile ducts appear stable. Pancreas: Within normal limits. Spleen: Within normal limits. Incidental small splenule (normal variant). Adrenals/Urinary Tract: Within normal limits. Stomach/Bowel: No dilated large or small bowel. Mostly intrathoracic stomach as before. Decompressed duodenum. Large bowel diverticulosis most pronounced in the pelvis. Retained stool most pronounced in the right colon. No discrete bowel inflammation identified. No free air or free fluid. Lymphatic: No lymphadenopathy  identified. Reproductive: Stable, small calcified uterine fibroid. Other: No pelvic free fluid. Musculoskeletal: Lumbar spine reported separately. Diffuse osteopenia. Stable sacral and pelvic bone mineralization. Chronic proximal left femur ORIF. Chronic bilateral pubic rami fractures. No acute osseous abnormality identified. Review of the MIP images confirms the above findings. IMPRESSION: 1. Negative for aortic dissection or aneurysm. Aortic Atherosclerosis (ICD10-I70.0). Resolved right lower lobe pulmonary embolus demonstrated last year. 2. No acute or inflammatory process identified in the chest, abdomen, or pelvis. Thoracic and lumbar spine CT today reported separately. 3. Large chronic gastric hiatal hernia. Chronic lung base atelectasis, scarring. Chronic Hepatomegaly. Large bowel diverticulosis. Electronically Signed   By: HGenevie AnnM.D.   On: 10/01/2021 04:14    Procedures Procedures    Medications Ordered in ED Medications  lidocaine (LIDODERM) 5 % 1 patch (1 patch Transdermal Patch Applied 10/01/21 0129)  morphine (PF) 2 MG/ML injection 2 mg (2 mg Intravenous Given 10/01/21 0147)  ondansetron (ZOFRAN) injection 4 mg (4 mg Intravenous Given 10/01/21 0146)  sodium chloride 0.9 % bolus 500 mL (0 mLs Intravenous Stopped 10/01/21 0256)  famotidine (PEPCID) IVPB 20 mg premix (0 mg Intravenous Stopped 10/01/21 0256)  iohexol (OMNIPAQUE)  350 MG/ML injection 100 mL (100 mLs Intravenous Contrast Given 10/01/21 0355)  HYDROcodone-acetaminophen (NORCO/VICODIN) 5-325 MG per tablet 1 tablet (1 tablet Oral Given 10/01/21 0505)    ED Course/ Medical Decision Making/ A&P                           Medical Decision Making Amount and/or Complexity of Data Reviewed Labs: ordered. Radiology: ordered.  Risk Prescription drug management.    CC: back pain, abd pain, n/v, fatigue   This patient presents to the Emergency Department for the above complaint. This involves an extensive number of treatment  options and is a complaint that carries with it a high risk of complications and morbidity. Vital signs were reviewed. Serious etiologies considered.  Differential diagnosis includes but is not exclusive to musculoskeletal back pain, renal colic, urinary tract infection, pyelonephritis, intra-abdominal causes of back pain, aortic aneurysm or dissection, cauda equina syndrome, sciatica, lumbar disc disease, thoracic disc disease, ACS, pneumonia, gastritis, etc.   Record review:  Previous records obtained and reviewed  Prior office visits, prior oncology notes, prior labs and imaging  Additional history obtained from [place] and surgical history as noted above.   Work up as above, notable for:  Labs & imaging results that were available during my care of the patient were visualized by me and considered in my medical decision making.   I ordered imaging studies which included chest x-ray, CT thoracic and lumbar spine, CT abdomen pelvis and I visualized the imaging and I agree with radiologist interpretation.  Patient with T7 compression fracture possibly new.  Other chronic thoracic spine compression fractures also noted.  No dissection, no large PE  Cardiac monitoring reviewed and interpreted personally which shows NSR  Labs reviewed and are stable.  Management: Analgesics, IV fluids, antiemetic, TLSO brace   Reassessment:  Patient is feeling much better.  Discomfort likely secondary to compression fracture.  We will give patient TLSO brace.  She is neurologically intact.  Follow-up with surgery.  Patient agreeable.  Trop neg x2, ECG neg for acute ischemic changes, imaging stable. Favor atypical chest pain, pain is likely 2/2 pt's compression fx.  Pt was previously taking norco at home, will give short course and advised her to f/u with her pcp if more is needed  The patient improved significantly and was discharged in stable condition. Detailed discussions were had with the  patient regarding current findings, and need for close f/u with PCP or on call doctor. The patient has been instructed to return immediately if the symptoms worsen in any way for re-evaluation. Patient verbalized understanding and is in agreement with current care plan. All questions answered prior to discharge.      Addendum: Spoke with patients daughter over telephone regarding findings from today and plan for outpatient spine surgery follow up. She will arrange for transportation for her mother back to nursing facility.              Social determinants of health include -  Social History   Socioeconomic History   Marital status: Widowed    Spouse name: Not on file   Number of children: 3   Years of education: Not on file   Highest education level: Not on file  Occupational History   Occupation: n/a  Tobacco Use   Smoking status: Some Days    Packs/day: 1.00    Years: 37.00    Pack years: 37.00  Types: Cigarettes    Start date: 06/26/1949   Smokeless tobacco: Never   Tobacco comments:    quit 25 years ago  Vaping Use   Vaping Use: Never used  Substance and Sexual Activity   Alcohol use: Not Currently    Alcohol/week: 1.0 standard drink    Types: 1 Glasses of wine per week   Drug use: No   Sexual activity: Not Currently  Other Topics Concern   Not on file  Social History Narrative   Lost husband 07/29/07-    Lives in [place]   has 3 daughters, lost Mickel Baas (03-2017)   Moved to ALF 04-2021   Early Chars lives in [place], [place] ( [place])     Not driving as of [phone]            Social Determinants of Health   Financial Resource Strain: Low Risk    Difficulty of Paying Living Expenses: Not hard at all  Food Insecurity: Not on file  Transportation Needs: No Transportation Needs   Lack of Transportation (Medical): No   Lack of Transportation (Non-Medical): No  Physical Activity: Not on file  Stress: Not on file  Social Connections: Not on file  Intimate  Partner Violence: Not on file      This chart was dictated using voice recognition software.  Despite best efforts to proofread,  errors can occur which can change the documentation meaning.         Final Clinical Impression(s) / ED Diagnoses Final diagnoses:  Compression fracture of T7 vertebra, initial encounter [facility])    Rx / DC Orders ED Discharge Orders          Ordered    HYDROcodone-acetaminophen (NORCO/VICODIN) 5-325 MG tablet  Every 4 hours PRN        10/01/21 0506              Jeanell Sparrow, DO 10/01/21 [place], [place], DO 10/01/21 [phone]

## 2021-10-01 NOTE — ED Notes (Signed)
Patient transported to CT 

## 2021-10-01 NOTE — ED Notes (Signed)
Pt lying sideways with feet in the floor. This RN attempted to get pt back into bed to prevent fall. Pt started yelling at this RN to leave her alone she was not getting in the bed. Primary RN made aware for patient safety.

## 2021-10-01 NOTE — Discharge Instructions (Addendum)
It was a pleasure caring for you today in the emergency department.  Please return to the emergency department for any worsening or worrisome symptoms.  Please follow-up with your PCP regarding narcotic medications  Please avoid heavy lifting for the next 2 weeks

## 2021-10-01 NOTE — ED Notes (Signed)
Ortho tech at patient bedside.

## 2021-10-01 NOTE — ED Notes (Signed)
Spoke with daughter who states that all family members live out of town and are unable to pick up pt.  She states to send pt home by [facility] and that she will come to town and check on her later today. [facility] called, pt prep for transport.  Moved to Hall[address] to await [facility].

## 2021-10-03 ENCOUNTER — Other Ambulatory Visit: Payer: Self-pay | Admitting: Orthopedic Surgery

## 2021-10-03 ENCOUNTER — Ambulatory Visit (HOSPITAL_COMMUNITY): Admission: RE | Admit: 2021-10-03 | Payer: Medicare Other | Source: Ambulatory Visit

## 2021-10-03 ENCOUNTER — Other Ambulatory Visit (HOSPITAL_COMMUNITY): Payer: Self-pay | Admitting: Orthopedic Surgery

## 2021-10-03 DIAGNOSIS — M546 Pain in thoracic spine: Secondary | ICD-10-CM

## 2021-10-03 DIAGNOSIS — M5451 Vertebrogenic low back pain: Secondary | ICD-10-CM | POA: Diagnosis not present

## 2021-10-04 DIAGNOSIS — R0781 Pleurodynia: Secondary | ICD-10-CM | POA: Diagnosis not present

## 2021-10-04 DIAGNOSIS — R6889 Other general symptoms and signs: Secondary | ICD-10-CM | POA: Diagnosis not present

## 2021-10-04 DIAGNOSIS — S32028A Other fracture of second lumbar vertebra, initial encounter for closed fracture: Secondary | ICD-10-CM | POA: Diagnosis not present

## 2021-10-04 DIAGNOSIS — M546 Pain in thoracic spine: Secondary | ICD-10-CM | POA: Diagnosis not present

## 2021-10-04 DIAGNOSIS — R0902 Hypoxemia: Secondary | ICD-10-CM | POA: Diagnosis not present

## 2021-10-04 DIAGNOSIS — S32018A Other fracture of first lumbar vertebra, initial encounter for closed fracture: Secondary | ICD-10-CM | POA: Diagnosis not present

## 2021-10-04 DIAGNOSIS — Z743 Need for continuous supervision: Secondary | ICD-10-CM | POA: Diagnosis not present

## 2021-10-04 DIAGNOSIS — W19XXXA Unspecified fall, initial encounter: Secondary | ICD-10-CM | POA: Diagnosis not present

## 2021-10-05 ENCOUNTER — Emergency Department (HOSPITAL_COMMUNITY): Payer: Medicare Other

## 2021-10-05 ENCOUNTER — Other Ambulatory Visit: Payer: Self-pay

## 2021-10-05 ENCOUNTER — Emergency Department (HOSPITAL_COMMUNITY)
Admission: EM | Admit: 2021-10-05 | Discharge: 2021-10-05 | Disposition: A | Discharge status: Home / Self Care | Payer: Medicare Other | Attending: Emergency Medicine | Admitting: Emergency Medicine

## 2021-10-05 DIAGNOSIS — W19XXXA Unspecified fall, initial encounter: Secondary | ICD-10-CM | POA: Insufficient documentation

## 2021-10-05 DIAGNOSIS — I10 Essential (primary) hypertension: Secondary | ICD-10-CM | POA: Diagnosis not present

## 2021-10-05 DIAGNOSIS — S0990XA Unspecified injury of head, initial encounter: Secondary | ICD-10-CM | POA: Diagnosis not present

## 2021-10-05 DIAGNOSIS — I639 Cerebral infarction, unspecified: Secondary | ICD-10-CM | POA: Diagnosis not present

## 2021-10-05 DIAGNOSIS — M546 Pain in thoracic spine: Secondary | ICD-10-CM | POA: Insufficient documentation

## 2021-10-05 DIAGNOSIS — Z7982 Long term (current) use of aspirin: Secondary | ICD-10-CM | POA: Diagnosis not present

## 2021-10-05 DIAGNOSIS — Z743 Need for continuous supervision: Secondary | ICD-10-CM | POA: Diagnosis not present

## 2021-10-05 LAB — CBC WITH DIFFERENTIAL/PLATELET
Abs Immature Granulocytes: 0.05 10*3/uL (ref 0.00–0.07)
Basophils Absolute: 0 10*3/uL (ref 0.0–0.1)
Basophils Relative: 0 %
Eosinophils Absolute: 0.2 10*3/uL (ref 0.0–0.5)
Eosinophils Relative: 2 %
HCT: 48.2 % — ABNORMAL HIGH (ref 36.0–46.0)
Hemoglobin: 16 g/dL — ABNORMAL HIGH (ref 12.0–15.0)
Immature Granulocytes: 1 %
Lymphocytes Relative: 7 %
Lymphs Abs: 0.6 10*3/uL — ABNORMAL LOW (ref 0.7–4.0)
MCH: 31.6 pg (ref 26.0–34.0)
MCHC: 33.2 g/dL (ref 30.0–36.0)
MCV: 95.3 fL (ref 80.0–100.0)
Monocytes Absolute: 0.9 10*3/uL (ref 0.1–1.0)
Monocytes Relative: 10 %
Neutro Abs: 7.5 10*3/uL (ref 1.7–7.7)
Neutrophils Relative %: 80 %
Platelets: 484 10*3/uL — ABNORMAL HIGH (ref 150–400)
RBC: 5.06 MIL/uL (ref 3.87–5.11)
RDW: 13 % (ref 11.5–15.5)
WBC: 9.4 10*3/uL (ref 4.0–10.5)
nRBC: 0 % (ref 0.0–0.2)

## 2021-10-05 LAB — URINALYSIS, ROUTINE W REFLEX MICROSCOPIC
Bilirubin Urine: NEGATIVE
Glucose, UA: NEGATIVE mg/dL
Hgb urine dipstick: NEGATIVE
Ketones, ur: NEGATIVE mg/dL
Leukocytes,Ua: NEGATIVE
Nitrite: NEGATIVE
Protein, ur: NEGATIVE mg/dL
Specific Gravity, Urine: 1.005 (ref 1.005–1.030)
pH: 8 (ref 5.0–8.0)

## 2021-10-05 LAB — COMPREHENSIVE METABOLIC PANEL
ALT: 12 U/L (ref 0–44)
AST: 20 U/L (ref 15–41)
Albumin: 3.8 g/dL (ref 3.5–5.0)
Alkaline Phosphatase: 47 U/L (ref 38–126)
Anion gap: 9 (ref 5–15)
BUN: 7 mg/dL — ABNORMAL LOW (ref 8–23)
CO2: 34 mmol/L — ABNORMAL HIGH (ref 22–32)
Calcium: 10.9 mg/dL — ABNORMAL HIGH (ref 8.9–10.3)
Chloride: 89 mmol/L — ABNORMAL LOW (ref 98–111)
Creatinine, Ser: 0.74 mg/dL (ref 0.44–1.00)
GFR, Estimated: >60 mL/min (ref >60)
Glucose, Bld: 121 mg/dL — ABNORMAL HIGH (ref 70–99)
Potassium: 3.6 mmol/L (ref 3.5–5.1)
Sodium: 132 mmol/L — ABNORMAL LOW (ref 135–145)
Total Bilirubin: 1.2 mg/dL (ref 0.3–1.2)
Total Protein: 7 g/dL (ref 6.5–8.1)

## 2021-10-05 MED ORDER — LACTATED RINGERS IV BOLUS
500.0000 mL | Freq: Once | INTRAVENOUS | Status: AC
Start: 2021-10-05 — End: 2021-10-05
  Administered 2021-10-05: 500 mL via INTRAVENOUS

## 2021-10-05 MED ORDER — LACTATED RINGERS IV BOLUS
1000.0000 mL | Freq: Once | INTRAVENOUS | Status: AC
  Administered 2021-10-05: 1000 mL via INTRAVENOUS

## 2021-10-05 MED ORDER — ONDANSETRON HCL 4 MG/2ML IJ SOLN
4.0000 mg | Freq: Once | INTRAMUSCULAR | Status: AC
Start: 2021-10-05 — End: 2021-10-05
  Administered 2021-10-05: 4 mg via INTRAVENOUS
  Filled 2021-10-05: qty 2

## 2021-10-05 MED ORDER — OXYCODONE-ACETAMINOPHEN 5-325 MG PO TABS
1.0000 | ORAL_TABLET | Freq: Once | ORAL | Status: AC
  Administered 2021-10-05: 1 via ORAL
  Filled 2021-10-05: qty 1

## 2021-10-05 MED ORDER — OXYCODONE HCL 5 MG PO TABS: 5.0000 mg | ORAL_TABLET | ORAL | 0 refills | Status: DC | PRN

## 2021-10-05 NOTE — ED Notes (Signed)
Pt able to tolerate food and water

## 2021-10-05 NOTE — ED Notes (Signed)
EDP notified of the pt's new nausea

## 2021-10-05 NOTE — ED Triage Notes (Signed)
Pt brought in from the [facility] for a Mechanical fall. Pt fell on back and hit back of her head no LOC . NO blood thinners.   09/30/21 pt diagnoses with a T7 compression fracture.   90 RA  96 2L  188/98 70 HR  Cbg 170

## 2021-10-05 NOTE — ED Notes (Signed)
Patient transported to CT 

## 2021-10-05 NOTE — ED Provider Notes (Signed)
[facility] EMERGENCY DEPARTMENT Provider Note   CSN: [id number] Arrival date & time: 10/05/21  0012     History  Chief Complaint  Patient presents with   [name]    [name] is a 88 y.o. female.  Fall previously with a T7 fracture. Has pain related to that. Lost her balance again today and fell again. Lives in independent living. No real pain this time but did hit her head.    Fall      Home Medications Prior to Admission medications   Medication Sig Start Date End Date Taking? Authorizing Provider  acetaminophen (TYLENOL) 500 MG tablet Take 500 mg by mouth every 6 (six) hours as needed for mild pain.   Yes [provider]  alendronate (FOSAMAX) 70 MG tablet TAKE 1 TABLET(70 MG) BY MOUTH EVERY 7 DAYS WITH A FULL GLASS OF WATER AND ON AN EMPTY STOMACH Patient taking differently: Take 70 mg by mouth every Sunday. 08/09/21  Yes Colon Branch, MD  aspirin EC 81 MG tablet Take 81 mg by mouth daily. Swallow whole.   Yes [provider]  furosemide (LASIX) 20 MG tablet Take 1 tablet (20 mg total) by mouth every other day. 03/09/21  Yes Richardo Priest, MD  Homeopathic Products (SINUS MEDICINE PO) Take 1 tablet by mouth every 6 (six) hours as needed (sinus congestion).   Yes [provider]  lactose free nutrition (BOOST) LIQD Take 237 mLs by mouth every other day.   Yes [provider]  LORazepam (ATIVAN) 0.5 MG tablet TAKE 1 TABLET(0.5 MG) BY MOUTH TWICE DAILY AS NEEDED FOR ANXIETY Patient taking differently: Take 0.5 mg by mouth in the morning and at bedtime. 08/09/21  Yes Paz, Alda Berthold, MD  metoprolol tartrate (LOPRESSOR) 50 MG tablet TAKE 1 TABLET(50 MG) BY MOUTH TWICE DAILY Patient taking differently: Take 50 mg by mouth 2 (two) times daily. 08/08/21  Yes Paz, Alda Berthold, MD  nitroGLYCERIN (NITROSTAT) 0.4 MG SL tablet Place 1 tablet (0.4 mg total) under the tongue every 5 (five) minutes x 3 doses as needed for chest pain. 07/25/20   Yes Paz, Alda Berthold, MD  oxyCODONE (ROXICODONE) 5 MG immediate release tablet Take 1 tablet (5 mg total) by mouth every 4 (four) hours as needed for severe pain. 10/05/21  Yes Graclynn Vanantwerp, Corene Cornea, MD  valACYclovir (VALTREX) 500 MG tablet TAKE 2 TABLETS BY MOUTH AT ONSET OF RASH AND 2 TABLETS 12 HOURS LATER AS NEEDED Patient taking differently: Take 1,000 mg by mouth See admin instructions. Take at the onset of rash and repeat 12 hours later as needed. 02/17/21 02/17/22 Yes Ennever, Rudell Cobb, MD  omeprazole (PRILOSEC) 20 MG capsule Take 1 capsule (20 mg total) by mouth daily. Patient not taking: Reported on 10/05/2021 10/05/19   Wille Celeste, PA-C      Allergies    Penicillins, Valsartan, Atorvastatin, Carvedilol, Ezetimibe, Fluvastatin sodium, Magnesium hydroxide, Meloxicam, Pneumovax [pneumococcal polysaccharide vaccine], Quinapril hcl, Simvastatin, Topamax [topiramate], and Vit d-vit e-safflower oil    Review of Systems   Review of Systems  Physical Exam Updated Vital Signs BP (!) 168/94   Pulse 72   Temp 98.7 F (37.1 C) (Oral)   Resp 17   SpO2 90%  Physical Exam Vitals and nursing note reviewed.  Constitutional:      Appearance: She is well-developed.  HENT:     Head: Normocephalic and atraumatic.  Eyes:     Pupils: Pupils are equal, round, and reactive  to light.  Cardiovascular:     Rate and Rhythm: Normal rate and regular rhythm.  Pulmonary:     Effort: No respiratory distress.     Breath sounds: No stridor.  Abdominal:     General: Abdomen is flat. There is no distension.  Musculoskeletal:        General: No swelling or tenderness. Normal range of motion.     Cervical back: Normal range of motion.  Skin:    General: Skin is warm and dry.  Neurological:     General: No focal deficit present.     Mental Status: She is alert.    ED Results / Procedures / Treatments   Labs (all labs ordered are listed, but only abnormal results are displayed) Labs Reviewed  CBC WITH  DIFFERENTIAL/PLATELET - Abnormal; Notable for the following components:      Result Value   Hemoglobin 16.0 (*)    HCT 48.2 (*)    Platelets 484 (*)    Lymphs Abs 0.6 (*)    All other components within normal limits  COMPREHENSIVE METABOLIC PANEL - Abnormal; Notable for the following components:   Sodium 132 (*)    Chloride 89 (*)    CO2 34 (*)    Glucose, Bld 121 (*)    BUN 7 (*)    Calcium 10.9 (*)    All other components within normal limits  URINALYSIS, ROUTINE W REFLEX MICROSCOPIC - Abnormal; Notable for the following components:   APPearance HAZY (*)    All other components within normal limits    EKG None  Radiology CT Head Wo Contrast  Result Date: 10/05/2021 CLINICAL DATA:  Head trauma, minor (Age >= 65y).  Fall. EXAM: CT HEAD WITHOUT CONTRAST TECHNIQUE: Contiguous axial images were obtained from the base of the skull through the vertex without intravenous contrast. RADIATION DOSE REDUCTION: This exam was performed according to the departmental dose-optimization program which includes automated exposure control, adjustment of the mA and/or kV according to patient size and/or use of iterative reconstruction technique. COMPARISON:  05/02/2021 FINDINGS: Brain: Normal anatomic configuration. Parenchymal volume loss is commensurate with the patient's age. Moderate to severe subcortical and periventricular white matter changes are present likely reflecting the sequela of small vessel ischemia. Remote infarcts are seen within the right thalamus, right frontal cortex and the left cerebral hemisphere. Age-indeterminate infarct is seen within the left thalamus, new since prior examination. No abnormal intra or extra-axial mass lesion or fluid collection. No abnormal mass effect or midline shift. No evidence of acute intracranial hemorrhage. Ventricular size is normal. Cerebellum unremarkable. Vascular: No asymmetric hyperdense vasculature at the skull base. Skull: Intact Sinuses/Orbits:  Paranasal sinuses are clear. Orbits are unremarkable. Other: Mastoid air cells and middle ear cavities are clear. IMPRESSION: No acute intracranial injury.  No calvarial fracture. Age-indeterminate left thalamic infarct. Correlation with clinical examination is recommended. If indicated, this could be better assessed with MRI examination. Multiple remote infarcts, stable since prior examination. Advanced senescent change. Electronically Signed   By: Fidela Salisbury M.D.   On: 10/05/2021 02:11    Procedures Procedures    Medications Ordered in ED Medications  lactated ringers bolus 1,000 mL (0 mLs Intravenous Stopped 10/05/21 0503)  ondansetron (ZOFRAN) injection 4 mg (4 mg Intravenous Given 10/05/21 0247)  lactated ringers bolus 500 mL (500 mLs Intravenous New Bag/Given 10/05/21 0543)  oxyCODONE-acetaminophen (PERCOCET/ROXICET) 5-325 MG per tablet 1 tablet (1 tablet Oral Given 10/05/21 0539)    ED Course/ Medical Decision Making/ A&P  Medical Decision Making Amount and/or Complexity of Data Reviewed Labs: ordered. Radiology: ordered. ECG/medicine tests: ordered.  Risk Prescription drug management.   No obvious traumatic injury. Will workup for causes of fall.   Workup reassuring. When attempting to ambulate (won't wear brace, didn't bring with her) she has trouble because of pain. Will address pain and try again. However if she is still unsteady and has fallen all of these times, may need TOC/PT consult for safe disposition.   After the oxycodone patient was able to ambulate without any difficulty.  She states that it worked a lot better than the medication she had at home (Vicodin) because she had not been taking the medication at home because it made her nauseous.  She request a prescription for this.  She will not use her Vicodin at the same time.  We will also engage transition of care to get physical therapy and nursing at home to make sure she is taking her  medications correctly, wearing her brace correctly and then to try to increase her strength to avoid repeat falls.  If this keeps happening she may need to go to assisted living or rehab.  Final Clinical Impression(s) / ED Diagnoses Final diagnoses:  Fall, initial encounter    Rx / DC Orders ED Discharge Orders          [place]        10/05/21 [phone]    Face-to-face encounter (required for Medicare/Medicaid patients)       Comments: I Merrily Pew certify that this patient is under my care and that I, or a nurse practitioner or physician's assistant working with me, had a face-to-face encounter that meets the physician face-to-face encounter requirements with this patient on 10/05/2021. The encounter with the patient was in whole, or in part for the following medical condition(s) which is the primary reason for home health care (List medical condition): vertebral body fracture, difficult getting around and falls quite a bit. Further orders per PCP.   10/05/21 0649    oxyCODONE (ROXICODONE) 5 MG immediate release tablet  Every 4 hours PRN        10/05/21 0653              Adilene Areola, Corene Cornea, MD 10/05/21 [phone]

## 2021-10-05 NOTE — ED Notes (Signed)
Attempt to call to report x1

## 2021-10-05 NOTE — ED Notes (Signed)
Pt able to ambulate with minimal assistance in the hall way with a steady gait. EDP notified

## 2021-10-05 NOTE — ED Notes (Signed)
Pt states that she has not been compliant with wearing her back brace at home and complains of pain in her back and ribs that has been continued since her last visit 5/20

## 2021-10-05 NOTE — ED Notes (Signed)
Pt ambulated in the hall with maximum support. Gait was unsteady due to back pain.

## 2021-10-06 ENCOUNTER — Inpatient Hospital Stay: Payer: Medicare Other | Attending: Hematology & Oncology

## 2021-10-06 ENCOUNTER — Inpatient Hospital Stay: Payer: Medicare Other | Admitting: Family

## 2021-10-06 ENCOUNTER — Inpatient Hospital Stay: Payer: Medicare Other

## 2021-10-07 ENCOUNTER — Other Ambulatory Visit: Payer: Self-pay

## 2021-10-07 ENCOUNTER — Observation Stay (HOSPITAL_COMMUNITY): Payer: Medicare Other

## 2021-10-07 ENCOUNTER — Emergency Department (HOSPITAL_COMMUNITY): Payer: Medicare Other

## 2021-10-07 ENCOUNTER — Encounter (HOSPITAL_COMMUNITY): Payer: Self-pay

## 2021-10-07 ENCOUNTER — Observation Stay (HOSPITAL_COMMUNITY)
Admission: EM | Admit: 2021-10-07 | Discharge: 2021-10-10 | Disposition: A | Discharge status: Skilled Nursing Facility | Payer: Medicare Other | Attending: Family Medicine | Admitting: Family Medicine

## 2021-10-07 DIAGNOSIS — R6889 Other general symptoms and signs: Secondary | ICD-10-CM | POA: Diagnosis not present

## 2021-10-07 DIAGNOSIS — Z853 Personal history of malignant neoplasm of breast: Secondary | ICD-10-CM | POA: Insufficient documentation

## 2021-10-07 DIAGNOSIS — E43 Unspecified severe protein-calorie malnutrition: Secondary | ICD-10-CM | POA: Insufficient documentation

## 2021-10-07 DIAGNOSIS — K219 Gastro-esophageal reflux disease without esophagitis: Secondary | ICD-10-CM | POA: Diagnosis present

## 2021-10-07 DIAGNOSIS — Z86711 Personal history of pulmonary embolism: Secondary | ICD-10-CM | POA: Insufficient documentation

## 2021-10-07 DIAGNOSIS — I10 Essential (primary) hypertension: Secondary | ICD-10-CM | POA: Diagnosis not present

## 2021-10-07 DIAGNOSIS — D751 Secondary polycythemia: Secondary | ICD-10-CM | POA: Diagnosis present

## 2021-10-07 DIAGNOSIS — Z8673 Personal history of transient ischemic attack (TIA), and cerebral infarction without residual deficits: Secondary | ICD-10-CM | POA: Insufficient documentation

## 2021-10-07 DIAGNOSIS — I110 Hypertensive heart disease with heart failure: Secondary | ICD-10-CM | POA: Insufficient documentation

## 2021-10-07 DIAGNOSIS — I2699 Other pulmonary embolism without acute cor pulmonale: Secondary | ICD-10-CM | POA: Diagnosis present

## 2021-10-07 DIAGNOSIS — F17210 Nicotine dependence, cigarettes, uncomplicated: Secondary | ICD-10-CM | POA: Diagnosis not present

## 2021-10-07 DIAGNOSIS — F341 Dysthymic disorder: Secondary | ICD-10-CM | POA: Diagnosis present

## 2021-10-07 DIAGNOSIS — I2510 Atherosclerotic heart disease of native coronary artery without angina pectoris: Secondary | ICD-10-CM | POA: Insufficient documentation

## 2021-10-07 DIAGNOSIS — Z7982 Long term (current) use of aspirin: Secondary | ICD-10-CM | POA: Diagnosis not present

## 2021-10-07 DIAGNOSIS — K449 Diaphragmatic hernia without obstruction or gangrene: Secondary | ICD-10-CM | POA: Diagnosis not present

## 2021-10-07 DIAGNOSIS — Z743 Need for continuous supervision: Secondary | ICD-10-CM | POA: Diagnosis not present

## 2021-10-07 DIAGNOSIS — G9341 Metabolic encephalopathy: Principal | ICD-10-CM | POA: Insufficient documentation

## 2021-10-07 DIAGNOSIS — R4182 Altered mental status, unspecified: Secondary | ICD-10-CM | POA: Diagnosis present

## 2021-10-07 DIAGNOSIS — I5032 Chronic diastolic (congestive) heart failure: Secondary | ICD-10-CM | POA: Diagnosis not present

## 2021-10-07 DIAGNOSIS — M549 Dorsalgia, unspecified: Secondary | ICD-10-CM | POA: Diagnosis not present

## 2021-10-07 DIAGNOSIS — S22060A Wedge compression fracture of T7-T8 vertebra, initial encounter for closed fracture: Secondary | ICD-10-CM

## 2021-10-07 DIAGNOSIS — G9340 Encephalopathy, unspecified: Secondary | ICD-10-CM | POA: Diagnosis present

## 2021-10-07 DIAGNOSIS — Z79899 Other long term (current) drug therapy: Secondary | ICD-10-CM | POA: Diagnosis not present

## 2021-10-07 DIAGNOSIS — R410 Disorientation, unspecified: Secondary | ICD-10-CM | POA: Diagnosis not present

## 2021-10-07 DIAGNOSIS — Z96652 Presence of left artificial knee joint: Secondary | ICD-10-CM | POA: Insufficient documentation

## 2021-10-07 DIAGNOSIS — R400 Somnolence: Secondary | ICD-10-CM | POA: Diagnosis not present

## 2021-10-07 DIAGNOSIS — R0902 Hypoxemia: Secondary | ICD-10-CM | POA: Diagnosis not present

## 2021-10-07 DIAGNOSIS — R404 Transient alteration of awareness: Secondary | ICD-10-CM | POA: Diagnosis not present

## 2021-10-07 DIAGNOSIS — Z86718 Personal history of other venous thrombosis and embolism: Secondary | ICD-10-CM | POA: Diagnosis not present

## 2021-10-07 DIAGNOSIS — I6381 Other cerebral infarction due to occlusion or stenosis of small artery: Secondary | ICD-10-CM | POA: Diagnosis not present

## 2021-10-07 LAB — I-STAT VENOUS BLOOD GAS, ED
Acid-Base Excess: 8 mmol/L — ABNORMAL HIGH (ref 0.0–2.0)
Bicarbonate: 35.5 mmol/L — ABNORMAL HIGH (ref 20.0–28.0)
Calcium, Ion: 1.2 mmol/L (ref 1.15–1.40)
HCT: 49 % — ABNORMAL HIGH (ref 36.0–46.0)
Hemoglobin: 16.7 g/dL — ABNORMAL HIGH (ref 12.0–15.0)
O2 Saturation: 88 %
Potassium: 3.5 mmol/L (ref 3.5–5.1)
Sodium: 132 mmol/L — ABNORMAL LOW (ref 135–145)
TCO2: 37 mmol/L — ABNORMAL HIGH (ref 22–32)
pCO2, Ven: 56.1 mmHg (ref 44–60)
pH, Ven: 7.409 (ref 7.25–7.43)
pO2, Ven: 55 mmHg — ABNORMAL HIGH (ref 32–45)

## 2021-10-07 LAB — COMPREHENSIVE METABOLIC PANEL
ALT: 9 U/L (ref 0–44)
AST: 21 U/L (ref 15–41)
Albumin: 3.9 g/dL (ref 3.5–5.0)
Alkaline Phosphatase: 50 U/L (ref 38–126)
Anion gap: 12 (ref 5–15)
BUN: 18 mg/dL (ref 8–23)
CO2: 29 mmol/L (ref 22–32)
Calcium: 10.6 mg/dL — ABNORMAL HIGH (ref 8.9–10.3)
Chloride: 93 mmol/L — ABNORMAL LOW (ref 98–111)
Creatinine, Ser: 1.01 mg/dL — ABNORMAL HIGH (ref 0.44–1.00)
GFR, Estimated: 53 mL/min — ABNORMAL LOW (ref >60)
Glucose, Bld: 123 mg/dL — ABNORMAL HIGH (ref 70–99)
Potassium: 4 mmol/L (ref 3.5–5.1)
Sodium: 134 mmol/L — ABNORMAL LOW (ref 135–145)
Total Bilirubin: 1.6 mg/dL — ABNORMAL HIGH (ref 0.3–1.2)
Total Protein: 6.8 g/dL (ref 6.5–8.1)

## 2021-10-07 LAB — CBC
HCT: 51.4 % — ABNORMAL HIGH (ref 36.0–46.0)
Hemoglobin: 16.4 g/dL — ABNORMAL HIGH (ref 12.0–15.0)
MCH: 31.2 pg (ref 26.0–34.0)
MCHC: 31.9 g/dL (ref 30.0–36.0)
MCV: 97.7 fL (ref 80.0–100.0)
Platelets: 496 10*3/uL — ABNORMAL HIGH (ref 150–400)
RBC: 5.26 MIL/uL — ABNORMAL HIGH (ref 3.87–5.11)
RDW: 13.1 % (ref 11.5–15.5)
WBC: 11.6 10*3/uL — ABNORMAL HIGH (ref 4.0–10.5)
nRBC: 0 % (ref 0.0–0.2)

## 2021-10-07 LAB — URINALYSIS, ROUTINE W REFLEX MICROSCOPIC
Bacteria, UA: NONE SEEN
Bilirubin Urine: NEGATIVE
Glucose, UA: NEGATIVE mg/dL
Hgb urine dipstick: NEGATIVE
Ketones, ur: 20 mg/dL — AB
Leukocytes,Ua: NEGATIVE
Nitrite: NEGATIVE
Protein, ur: 30 mg/dL — AB
Specific Gravity, Urine: 1.025 (ref 1.005–1.030)
pH: 5 (ref 5.0–8.0)

## 2021-10-07 LAB — CBG MONITORING, ED: Glucose-Capillary: 108 mg/dL — ABNORMAL HIGH (ref 70–99)

## 2021-10-07 LAB — AMMONIA: Ammonia: 47 umol/L — ABNORMAL HIGH (ref 9–35)

## 2021-10-07 MED ORDER — LIDOCAINE 5 % EX PTCH
1.0000 | MEDICATED_PATCH | CUTANEOUS | Status: DC
  Administered 2021-10-07 – 2021-10-10 (×4): 1 via TRANSDERMAL
  Filled 2021-10-07 (×4): qty 1

## 2021-10-07 MED ORDER — ASPIRIN 81 MG PO TBEC
81.0000 mg | DELAYED_RELEASE_TABLET | Freq: Every day | ORAL | Status: DC
  Administered 2021-10-07 – 2021-10-10 (×4): 81 mg via ORAL
  Filled 2021-10-07 (×4): qty 1

## 2021-10-07 MED ORDER — MORPHINE SULFATE (PF) 2 MG/ML IV SOLN: 1.0000 mg | INTRAVENOUS | Status: DC | PRN

## 2021-10-07 MED ORDER — TRAMADOL HCL 50 MG PO TABS
50.0000 mg | ORAL_TABLET | Freq: Four times a day (QID) | ORAL | Status: DC
  Administered 2021-10-07 – 2021-10-08 (×4): 50 mg via ORAL
  Filled 2021-10-07 (×4): qty 1

## 2021-10-07 MED ORDER — ONDANSETRON HCL 4 MG/2ML IJ SOLN: 4.0000 mg | Freq: Four times a day (QID) | INTRAMUSCULAR | Status: DC | PRN

## 2021-10-07 MED ORDER — METOPROLOL TARTRATE 50 MG PO TABS
50.0000 mg | ORAL_TABLET | Freq: Two times a day (BID) | ORAL | Status: DC
  Administered 2021-10-07 – 2021-10-10 (×6): 50 mg via ORAL
  Filled 2021-10-07 (×6): qty 1

## 2021-10-07 MED ORDER — ACETAMINOPHEN 325 MG PO TABS: 650.0000 mg | ORAL_TABLET | Freq: Four times a day (QID) | ORAL | Status: DC | PRN

## 2021-10-07 MED ORDER — ONDANSETRON HCL 4 MG PO TABS: 4.0000 mg | ORAL_TABLET | Freq: Four times a day (QID) | ORAL | Status: DC | PRN

## 2021-10-07 MED ORDER — MORPHINE SULFATE (PF) 2 MG/ML IV SOLN
0.2500 mg | Freq: Once | INTRAVENOUS | Status: AC
  Administered 2021-10-07: 0.25 mg via INTRAVENOUS
  Filled 2021-10-07: qty 1

## 2021-10-07 MED ORDER — SODIUM CHLORIDE 0.9 % IV SOLN: INTRAVENOUS | Status: DC

## 2021-10-07 MED ORDER — ACETAMINOPHEN 650 MG RE SUPP: 650.0000 mg | Freq: Four times a day (QID) | RECTAL | Status: DC | PRN

## 2021-10-07 MED ORDER — PANTOPRAZOLE SODIUM 40 MG PO TBEC
40.0000 mg | DELAYED_RELEASE_TABLET | Freq: Every day | ORAL | Status: DC
  Administered 2021-10-07 – 2021-10-10 (×4): 40 mg via ORAL
  Filled 2021-10-07 (×4): qty 1

## 2021-10-07 MED ORDER — HYDRALAZINE HCL 20 MG/ML IJ SOLN
5.0000 mg | Freq: Three times a day (TID) | INTRAMUSCULAR | Status: DC | PRN
  Administered 2021-10-09 – 2021-10-10 (×2): 5 mg via INTRAVENOUS
  Filled 2021-10-07 (×2): qty 1

## 2021-10-07 MED ORDER — SODIUM CHLORIDE 0.9 % IV SOLN: INTRAVENOUS | Status: AC

## 2021-10-07 MED ORDER — PANTOPRAZOLE SODIUM 40 MG PO TBEC: 40.0000 mg | DELAYED_RELEASE_TABLET | Freq: Every day | ORAL | Status: DC | PRN

## 2021-10-07 MED ORDER — MORPHINE SULFATE (PF) 2 MG/ML IV SOLN: 0.5000 mg | INTRAVENOUS | Status: DC | PRN

## 2021-10-07 MED ORDER — OXYCODONE HCL 5 MG PO TABS
5.0000 mg | ORAL_TABLET | Freq: Once | ORAL | Status: AC
  Administered 2021-10-07: 5 mg via ORAL
  Filled 2021-10-07: qty 1

## 2021-10-07 MED ORDER — LORAZEPAM 1 MG PO TABS
0.5000 mg | ORAL_TABLET | Freq: Once | ORAL | Status: AC
  Administered 2021-10-07: 0.5 mg via ORAL
  Filled 2021-10-07: qty 1

## 2021-10-07 MED ORDER — LORAZEPAM 0.5 MG PO TABS
0.5000 mg | ORAL_TABLET | Freq: Two times a day (BID) | ORAL | Status: DC
  Administered 2021-10-07 – 2021-10-08 (×2): 0.5 mg via ORAL
  Filled 2021-10-07 (×2): qty 1

## 2021-10-07 NOTE — ED Notes (Signed)
ED TO INPATIENT HANDOFF REPORT  ED Nurse Name and Phone #: Lysbeth Galas [phone]  S Name/Age/Gender [name] 88 y.o. female Room/Bed: 002C/002C  Code Status   Code Status: Prior  Home/SNF/Other Nursing Home Patient oriented to: self Is this baseline? No   Triage Complete: Triage complete  Chief Complaint Acute encephalopathy [G93.40]  Triage Note  Pt BIB [facility] from from [facility] for AMS. Per EMS, pt was found on the couch outside talking out of her head by the neighbor, just acting not like herself. Pt keeps asking for help  but unable to specify. No hx of dementia. EMS was not able to get in touch w family to get a baseline, and the residents there are not really familiar with the pt as she;s still fairly new there. VSS. A&O x3.   BP 147/53 HR 80  O2 96%  Cbg 142      Allergies Allergies  Allergen Reactions   Penicillins Hives, Rash and Other (See Comments)    Has patient had a PCN reaction causing immediate rash, facial/tongue/throat swelling, SOB or lightheadedness with hypotension: Yes Has patient had a PCN reaction causing severe rash involving mucus membranes or skin necrosis: yes Has patient had a PCN reaction that required hospitalization: No Has patient had a PCN reaction occurring within the last 10 years: no If all of the above answers are "NO", then may proceed with Cephalosporin use.  Other Reaction: OTHER REACTION   Valsartan Itching and Other (See Comments)   Atorvastatin Diarrhea   Carvedilol Other (See Comments)    "teeth hurt when I took it"    Ezetimibe Nausea Only   Fluvastatin Sodium Other (See Comments)    "Can't remember"   Magnesium Hydroxide Other (See Comments)    REACTION: triggers HAs   Meloxicam Other (See Comments)    Pt seeing auro's / spots.     Pneumovax [Pneumococcal Polysaccharide Vaccine] Other (See Comments)    Local reaction   Quinapril Hcl Other (See Comments)     "feelings of tiredness"    Simvastatin Diarrhea   Topamax [Topiramate] Itching   Vit D-Vit E-Safflower Oil Other (See Comments)    Headaches    Level of Care/Admitting Diagnosis ED Disposition     ED Disposition  Admit   Condition  --   Comment  Hospital Area: [place] [[id number]]  Level of Care: Telemetry Medical [104]  May place patient in observation at [facility] or [facility] if equivalent level of care is available:: Yes  Covid Evaluation: Asymptomatic - no recent exposure (last 10 days) testing not required  Diagnosis: Acute encephalopathy [578469]  Admitting Physician: Orma Flaming [[id number]]  Attending Physician: Orma Flaming [[id number]]          B Medical/Surgery History Past Medical History:  Diagnosis Date   CAD (coronary artery disease)    mild nonobstructive disease on cath in 2003   Cancer [facility])    Cardiomyopathy    Probable Takotsubo, severe CP w/ normal cath in 1994. Severe CP in 2003 w/ widespread T wave inversions on ECG. Cath w/ minimal coronary disease but LV-gram showed periapical severe hypokinesis and basilar hyperkinesia (EF 40%). Last echo in 4/09 confirmed full LV functional recovery with EF 60%, no regional wall motion abnormalities, mild to moderate LVH.   Chronic diastolic CHF (congestive heart failure) ([place]) 09/13/2019   CVA (cerebral infarction)    Depression with anxiety 03/29/2011   lost husband 3'09   DVT (deep venous  thrombosis) ([place])    after venous ablation, R leg   Fracture 09/10/2007   L2, status post vertebroplasty of L2 performed by IR   GERD (gastroesophageal reflux disease)    Hemorrhoids    Hiatal hernia 03/29/2011   no nerve problems   Hyperlipidemia    Hyperplastic colon polyp 06/2001   Hypertension    Iron deficiency anemia due to chronic blood loss 12/30/2017   OA (osteoarthritis) of knee 03/29/2011   w/ bilateral knee pain-not a problem now   OSA (obstructive sleep apnea) 03/29/2011   no cpap used, not a problem now.    Osteoporosis    Otalgia of both ears    Dr. Simeon Craft   Polycythemia    Stroke [facility]) 03/29/2011   CVA x2 -last 10'12-?TIA(visual problems)   Varicose vein    Past Surgical History:  Procedure Laterality Date   APPENDECTOMY     BACK SURGERY     BREAST BIOPSY     BREAST CYST EXCISION     BREAST LUMPECTOMY Left 2013   left stage I left breast cancer   CHOLECYSTECTOMY  04/09/2011   Procedure: LAPAROSCOPIC CHOLECYSTECTOMY WITH INTRAOPERATIVE CHOLANGIOGRAM;  Surgeon: Odis Hollingshead, MD;  Location: [facility];  Service: General;  Laterality: N/A;   Dental Extraction     L maxillary molar   INTRAMEDULLARY (IM) NAIL INTERTROCHANTERIC Left 12/25/2019   Procedure: INTRAMEDULLARY (IM) NAIL INTERTROCHANTRIC;  Surgeon: Paralee Cancel, MD;  Location: [facility];  Service: Orthopedics;  Laterality: Left;   IR KYPHO THORACIC WITH BONE BIOPSY  09/15/2019   IR KYPHO THORACIC WITH BONE BIOPSY  07/13/2020       IR KYPHO THORACIC WITH BONE BIOPSY  07/13/2020   IR VERTEBROPLASTY CERV/THOR BX INC UNI/BIL INC/INJECT/IMAGING  11/10/2019   KYPHOPLASTY N/A 11/16/2020   Procedure: T11,  L1,KYPHOPLASTY;  Surgeon: Melina Schools, MD;  Location: [place];  Service: Orthopedics;  Laterality: N/A;   KYPHOSIS SURGERY  08/2007   cement used   OVARIAN CYST SURGERY     left   RADIOLOGY WITH ANESTHESIA N/A 11/10/2019   Procedure: VERTEBROPLASTY;  Surgeon: Luanne Bras, MD;  Location: [place];  Service: Radiology;  Laterality: N/A;   TONSILLECTOMY     TOTAL KNEE ARTHROPLASTY  06/2010   left   TUBAL LIGATION       A IV Location/Drains/Wounds Patient Lines/Drains/Airways Status     Active Line/Drains/Airways     Name Placement date Placement time Site Days   Peripheral IV 10/07/21 20 G Posterior;Right Hand 10/07/21  1409  Hand  less than 1   Peripheral IV 10/07/21 20 G Left;Posterior Wrist 10/07/21  1410  Wrist  less than 1            Intake/Output Last 24 hours No intake or output data in the 24 hours ending 10/07/21  1745  Labs/Imaging Results for orders placed or performed during the hospital encounter of 10/07/21 (from the past 48 hour(s))  Comprehensive metabolic panel     Status: Abnormal   Collection Time: 10/07/21 11:16 AM  Result Value Ref Range   Sodium 134 (L) 135 - 145 mmol/L   Potassium 4.0 3.5 - 5.1 mmol/L   Chloride 93 (L) 98 - 111 mmol/L   CO2 29 22 - 32 mmol/L   Glucose, Bld 123 (H) 70 - 99 mg/dL    Comment: Glucose reference range applies only to samples taken after fasting for at least 8 hours.   BUN 18 8 - 23 mg/dL  Creatinine, Ser 1.01 (H) 0.44 - 1.00 mg/dL   Calcium 10.6 (H) 8.9 - 10.3 mg/dL   Total Protein 6.8 6.5 - 8.1 g/dL   Albumin 3.9 3.5 - 5.0 g/dL   AST 21 15 - 41 U/L   ALT 9 0 - 44 U/L   Alkaline Phosphatase 50 38 - 126 U/L   Total Bilirubin 1.6 (H) 0.3 - 1.2 mg/dL   GFR, Estimated 53 (L) >60 mL/min    Comment: (NOTE) Calculated using the CKD-EPI Creatinine Equation (2021)    Anion gap 12 5 - 15    Comment: Performed at [facility] [address] [place], [place] [address]  CBC     Status: Abnormal   Collection Time: 10/07/21 11:16 AM  Result Value Ref Range   WBC 11.6 (H) 4.0 - 10.5 K/uL   RBC 5.26 (H) 3.87 - 5.11 MIL/uL   Hemoglobin 16.4 (H) 12.0 - 15.0 g/dL   HCT 51.4 (H) 36.0 - 46.0 %   MCV 97.7 80.0 - 100.0 fL   MCH 31.2 26.0 - 34.0 pg   MCHC 31.9 30.0 - 36.0 g/dL   RDW 13.1 11.5 - 15.5 %   Platelets 496 (H) 150 - 400 K/uL   nRBC 0.0 0.0 - 0.2 %    Comment: Performed at [facility], [place] [address] [place], [place] [address]  Ammonia     Status: Abnormal   Collection Time: 10/07/21 11:16 AM  Result Value Ref Range   Ammonia 47 (H) 9 - 35 umol/L    Comment: Performed at [facility], [place] [address], [place], [place] [address]  Urinalysis, Routine w reflex microscopic     Status: Abnormal   Collection Time: 10/07/21 12:12 PM  Result Value Ref Range   Color, Urine AMBER (A) YELLOW    Comment: BIOCHEMICALS MAY BE AFFECTED BY  COLOR   APPearance HAZY (A) CLEAR   Specific Gravity, Urine 1.025 1.005 - 1.030   pH 5.0 5.0 - 8.0   Glucose, UA NEGATIVE NEGATIVE mg/dL   Hgb urine dipstick NEGATIVE NEGATIVE   Bilirubin Urine NEGATIVE NEGATIVE   Ketones, ur 20 (A) NEGATIVE mg/dL   Protein, ur 30 (A) NEGATIVE mg/dL   Nitrite NEGATIVE NEGATIVE   Leukocytes,Ua NEGATIVE NEGATIVE   WBC, UA 0-5 0 - 5 WBC/hpf   Bacteria, UA NONE SEEN NONE SEEN   Squamous Epithelial / LPF 0-5 0 - 5   Mucus PRESENT    Hyaline Casts, UA PRESENT    Ca Oxalate Crys, UA PRESENT     Comment: Performed at [facility], [place] [address] [place], [place] [address]  I-Stat venous blood gas, ED     Status: Abnormal   Collection Time: 10/07/21 12:52 PM  Result Value Ref Range   pH, Ven 7.409 7.25 - 7.43   pCO2, Ven 56.1 44 - 60 mmHg   pO2, Ven 55 (H) 32 - 45 mmHg   Bicarbonate 35.5 (H) 20.0 - 28.0 mmol/L   TCO2 37 (H) 22 - 32 mmol/L   O2 Saturation 88 %   Acid-Base Excess 8.0 (H) 0.0 - 2.0 mmol/L   Sodium 132 (L) 135 - 145 mmol/L   Potassium 3.5 3.5 - 5.1 mmol/L   Calcium, Ion 1.20 1.15 - 1.40 mmol/L   HCT 49.0 (H) 36.0 - 46.0 %   Hemoglobin 16.7 (H) 12.0 - 15.0 g/dL   Sample type VENOUS   CBG monitoring, ED  Status: Abnormal   Collection Time: 10/07/21  2:08 PM  Result Value Ref Range   Glucose-Capillary 108 (H) 70 - 99 mg/dL    Comment: Glucose reference range applies only to samples taken after fasting for at least 8 hours.   Comment 1 Notify RN    Comment 2 Document in Chart    *Note: Due to a large number of results and/or encounters for the requested time period, some results have not been displayed. A complete set of results can be found in Results Review.   CT Head Wo Contrast  Result Date: 10/07/2021 CLINICAL DATA:  Altered level of consciousness EXAM: CT HEAD WITHOUT CONTRAST TECHNIQUE: Contiguous axial images were obtained from the base of the skull through the vertex without intravenous contrast. RADIATION DOSE  REDUCTION: This exam was performed according to the departmental dose-optimization program which includes automated exposure control, adjustment of the mA and/or kV according to patient size and/or use of iterative reconstruction technique. COMPARISON:  10/05/2021 FINDINGS: Brain: Chronic ischemic changes are again seen throughout the periventricular white matter, inferior right frontal cortex, and right thalamus. Hypodensity within the left thalamus compatible with age indeterminate infarct unchanged since prior exam. No new areas of infarction or hemorrhage. Lateral ventricles and remaining midline structures are unremarkable. No acute extra-axial fluid collections. No mass effect. Vascular: Stable atherosclerosis.  No hyperdense vessel. Skull: Normal. Negative for fracture or focal lesion. Sinuses/Orbits: No acute finding. Other: None. IMPRESSION: 1. Stable age-indeterminate left thalamic lacunar infarct. 2. Chronic ischemic changes throughout the periventricular white matter, inferior right frontal cortex, and right thalamus. 3. No acute hemorrhage. Electronically Signed   By: Randa Ngo M.D.   On: 10/07/2021 15:07    Pending Labs Unresulted Labs (From admission, onward)    None       Vitals/Pain Today's Vitals   10/07/21 1400 10/07/21 1430 10/07/21 1442 10/07/21 1615  BP: (!) 155/71 (!) 152/67  (!) 174/70  Pulse: 81 75  88  Resp: 17 16  (!) 23  Temp:      TempSrc:      SpO2: 98% 95%  94%  PainSc:   Asleep     Isolation Precautions No active isolations  Medications Medications  LORazepam (ATIVAN) tablet 0.5 mg (0.5 mg Oral Given 10/07/21 1229)  oxyCODONE (Oxy IR/ROXICODONE) immediate release tablet 5 mg (5 mg Oral Given 10/07/21 1405)    Mobility walks with device Moderate fall risk   Focused Assessments Neuro Assessment Handoff:  Swallow screen pass? Yes          Neuro Assessment: Exceptions to WDL   R Recommendations: See Admitting Provider Note  Report given  to:   Additional Notes:

## 2021-10-07 NOTE — ED Triage Notes (Signed)
Pt BIB [facility] from from [facility] for AMS. Per EMS, pt was found on the couch outside talking out of her head by the neighbor, just acting not like herself. Pt keeps asking for help  but unable to specify. No hx of dementia. EMS was not able to get in touch w family to get a baseline, and the residents there are not really familiar with the pt as she;s still fairly new there. VSS. A&O x3.   BP 147/53 HR 80  O2 96%  Cbg 142

## 2021-10-07 NOTE — Assessment & Plan Note (Addendum)
No chest pain Continue lopressor Intolerant to most medicines including Zetia, Pravachol, Niaspan hx of Takotsubo cardiomyopathy 1990 with normal coronary arteriography, repeat coronary angiography in 2003 with mild nonobstructive CAD, cardiomyopathy initial ejection fraction 40% subsequently recovered in January 2020 60%

## 2021-10-07 NOTE — Assessment & Plan Note (Addendum)
Hx of PE in 11/2020. Recent CTA showed this was resolved on 10/01/21 On no AC due to falls

## 2021-10-07 NOTE — H&P (Signed)
History and Physical    Patient: [name] DOB: 12/05/1929 DOA: 10/07/2021 DOS: the patient was seen and examined on 10/07/2021 PCP: Colon Branch, MD  Patient coming from: ALF/ILF - [place]   Chief Complaint: AMS  HPI: [name] is a 88 y.o. female with medical history significant of CAD, diastolic CHF, hx of CVA, hx of DVT after venous ablation and hx of PE in 7/22, HTN, HLD, polycythemia vera, hx of breast cancer who presented to Ed with AMS. She had a fall on 5/25 and was seen in ED and had previous falls before this.  Neighbors noticed her talking off the wall yesterday and confusion has continued into today.   I talked to the daughter today who states she has been in severe pain for about 9 days. She was changing the sheets on her bed and felt something compress in her back. She called an ambulance and was seen in in ED on 5/21 and had a CT thoracic and lumbar spine done which showed possible T7 compression fracture, possibly new. Chronic thoracic spine compression fractures noted.  She was sent home.  They went and saw an orthopedic doctor in [place] who ordered a MRI. They have not been called on results. Daughter states she didn't know she was confused until this AM when she had wondered down the hall. Her neighbor called her and let her know that EMS was there. When she got to the hospital, she states it took her awhile to recognize her, but then she knew who she was. She has been on 4-5 oxycodone a day and not sure if contributing.   No hx of seizures. She fell and hit her head and had a seizure as a result of the trauma and did not keep her on long term AE.   She is pleasant and answering questions appropriately. Alert to her self. Unsure why she is here.   She does not smoke or drink alcohol.   ER Course:  vitals: temp: 97.4, bp: 143/75, HR: 77, RR: 12, oxygen: 98%RA Pertinent labs: wbc: 11.6, hgb: 16.4/hct: 51.4, ammonia 47, UA: 20 ketones,  vbg: pH: 7.409,  CT head: stable, age indeterminate left thalamic lacunar infarct. Chronic ischemic changes through the periventricular white matter, inferior right cortex and right thalamus. No hemorrhage. In ED: neurology consulted.     Review of Systems: unable to review all systems due to the inability of the patient to answer questions. Past Medical History:  Diagnosis Date   CAD (coronary artery disease)    mild nonobstructive disease on cath in 2003   Cancer [facility])    Cardiomyopathy    Probable Takotsubo, severe CP w/ normal cath in 1994. Severe CP in 2003 w/ widespread T wave inversions on ECG. Cath w/ minimal coronary disease but LV-gram showed periapical severe hypokinesis and basilar hyperkinesia (EF 40%). Last echo in 4/09 confirmed full LV functional recovery with EF 60%, no regional wall motion abnormalities, mild to moderate LVH.   Chronic diastolic CHF (congestive heart failure) ([place]) 09/13/2019   CVA (cerebral infarction)    Depression with anxiety 03/29/2011   lost husband 3'09   DVT (deep venous thrombosis) ([place])    after venous ablation, R leg   Fracture 09/10/2007   L2, status post vertebroplasty of L2 performed by IR   GERD (gastroesophageal reflux disease)    Hemorrhoids    Hiatal hernia 03/29/2011   no nerve problems   Hyperlipidemia    Hyperplastic colon  polyp 06/2001   Hypertension    Iron deficiency anemia due to chronic blood loss 12/30/2017   OA (osteoarthritis) of knee 03/29/2011   w/ bilateral knee pain-not a problem now   OSA (obstructive sleep apnea) 03/29/2011   no cpap used, not a problem now.   Osteoporosis    Otalgia of both ears    Dr. Simeon Craft   Polycythemia    Stroke [facility]) 03/29/2011   CVA x2 -last 10'12-?TIA(visual problems)   Varicose vein    Past Surgical History:  Procedure Laterality Date   APPENDECTOMY     BACK SURGERY     BREAST BIOPSY     BREAST CYST EXCISION     BREAST LUMPECTOMY Left 2013   left stage I left breast cancer    CHOLECYSTECTOMY  04/09/2011   Procedure: LAPAROSCOPIC CHOLECYSTECTOMY WITH INTRAOPERATIVE CHOLANGIOGRAM;  Surgeon: Odis Hollingshead, MD;  Location: [facility];  Service: General;  Laterality: N/A;   Dental Extraction     L maxillary molar   INTRAMEDULLARY (IM) NAIL INTERTROCHANTERIC Left 12/25/2019   Procedure: INTRAMEDULLARY (IM) NAIL INTERTROCHANTRIC;  Surgeon: Paralee Cancel, MD;  Location: [facility];  Service: Orthopedics;  Laterality: Left;   IR KYPHO THORACIC WITH BONE BIOPSY  09/15/2019   IR KYPHO THORACIC WITH BONE BIOPSY  07/13/2020       IR KYPHO THORACIC WITH BONE BIOPSY  07/13/2020   IR VERTEBROPLASTY CERV/THOR BX INC UNI/BIL INC/INJECT/IMAGING  11/10/2019   KYPHOPLASTY N/A 11/16/2020   Procedure: T11,  L1,KYPHOPLASTY;  Surgeon: Melina Schools, MD;  Location: [place];  Service: Orthopedics;  Laterality: N/A;   KYPHOSIS SURGERY  08/2007   cement used   OVARIAN CYST SURGERY     left   RADIOLOGY WITH ANESTHESIA N/A 11/10/2019   Procedure: VERTEBROPLASTY;  Surgeon: Luanne Bras, MD;  Location: [place];  Service: Radiology;  Laterality: N/A;   TONSILLECTOMY     TOTAL KNEE ARTHROPLASTY  06/2010   left   TUBAL LIGATION     Social History:  reports that she has been smoking cigarettes. She started smoking about 70 years ago. She has a 37.00 pack-year smoking history. She has never used smokeless tobacco. She reports that she does not currently use alcohol after a past usage of about 1.0 standard drink per week. She reports that she does not use drugs.  Allergies  Allergen Reactions   Penicillins Hives, Rash and Other (See Comments)    Has patient had a PCN reaction causing immediate rash, facial/tongue/throat swelling, SOB or lightheadedness with hypotension: Yes Has patient had a PCN reaction causing severe rash involving mucus membranes or skin necrosis: yes Has patient had a PCN reaction that required hospitalization: No Has patient had a PCN reaction occurring within the last 10 years: no If  all of the above answers are "NO", then may proceed with Cephalosporin use.  Other Reaction: OTHER REACTION   Valsartan Itching and Other (See Comments)   Atorvastatin Diarrhea   Carvedilol Other (See Comments)    "teeth hurt when I took it"    Ezetimibe Nausea Only   Fluvastatin Sodium Other (See Comments)    "Can't remember"   Magnesium Hydroxide Other (See Comments)    REACTION: triggers HAs   Meloxicam Other (See Comments)    Pt seeing auro's / spots.     Pneumovax [Pneumococcal Polysaccharide Vaccine] Other (See Comments)    Local reaction   Quinapril Hcl Other (See Comments)     "feelings of tiredness"   Simvastatin Diarrhea  Topamax [Topiramate] Itching   Vit D-Vit E-Safflower Oil Other (See Comments)    Headaches    Family History  Problem Relation Age of Onset   Breast cancer Mother    Heart disease Father        MIs   Colon cancer Other        cousin   Breast cancer Sister    Leukemia Brother        GM    Prior to Admission medications   Medication Sig Start Date End Date Taking? Authorizing Provider  acetaminophen (TYLENOL) 500 MG tablet Take 500 mg by mouth every 6 (six) hours as needed for mild pain.   Yes [provider]  alendronate (FOSAMAX) 70 MG tablet TAKE 1 TABLET(70 MG) BY MOUTH EVERY 7 DAYS WITH A FULL GLASS OF WATER AND ON AN EMPTY STOMACH Patient taking differently: Take 70 mg by mouth every Sunday. 08/09/21  Yes Colon Branch, MD  aspirin EC 81 MG tablet Take 81 mg by mouth daily. Swallow whole.   Yes [provider]  furosemide (LASIX) 20 MG tablet Take 1 tablet (20 mg total) by mouth every other day. 03/09/21  Yes Richardo Priest, MD  Homeopathic Products (SINUS MEDICINE PO) Take 1 tablet by mouth every 6 (six) hours as needed (sinus congestion).   Yes [provider]  lactose free nutrition (BOOST) LIQD Take 237 mLs by mouth every other day.   Yes [provider]  LORazepam (ATIVAN) 0.5 MG tablet TAKE 1  TABLET(0.5 MG) BY MOUTH TWICE DAILY AS NEEDED FOR ANXIETY Patient taking differently: Take 0.5 mg by mouth in the morning and at bedtime. 08/09/21  Yes Paz, Alda Berthold, MD  metoprolol tartrate (LOPRESSOR) 50 MG tablet TAKE 1 TABLET(50 MG) BY MOUTH TWICE DAILY Patient taking differently: Take 50 mg by mouth 2 (two) times daily. 08/08/21  Yes Paz, Alda Berthold, MD  nitroGLYCERIN (NITROSTAT) 0.4 MG SL tablet Place 1 tablet (0.4 mg total) under the tongue every 5 (five) minutes x 3 doses as needed for chest pain. 07/25/20  Yes Paz, Alda Berthold, MD  omeprazole (PRILOSEC) 20 MG capsule Take 1 capsule (20 mg total) by mouth daily. Patient taking differently: Take 20 mg by mouth daily as needed (heartburn). 10/05/19  Yes Lassen, Arlo C, PA-C  oxyCODONE (ROXICODONE) 5 MG immediate release tablet Take 1 tablet (5 mg total) by mouth every 4 (four) hours as needed for severe pain. 10/05/21  Yes Mesner, Corene Cornea, MD  valACYclovir (VALTREX) 500 MG tablet TAKE 2 TABLETS BY MOUTH AT ONSET OF RASH AND 2 TABLETS 12 HOURS LATER AS NEEDED Patient taking differently: Take 1,000 mg by mouth See admin instructions. Take at the onset of rash and repeat 12 hours later as needed. 02/17/21 02/17/22 Yes Volanda Napoleon, MD    Physical Exam: Vitals:   10/07/21 1330 10/07/21 1400 10/07/21 1430 10/07/21 1615  BP: (!) 156/76 (!) 155/71 (!) 152/67 (!) 174/70  Pulse: 81 81 75 88  Resp: '20 17 16 '$ (!) 23  Temp:      TempSrc:      SpO2: 96% 98% 95% 94%   General:  Appears calm and comfortable and is in NAD. Thin appearing  Eyes:  PERRL, EOMI, normal lids, iris ENT:  grossly normal hearing, lips & tongue, mmm; no teeth  Neck:  no LAD, masses or thyromegaly; no carotid bruits Cardiovascular:  RRR, +systolic murmur.  No LE edema.  Respiratory:   CTA bilaterally with no wheezes/rales/rhonchi.  Normal respiratory effort. Abdomen:  soft, NT, ND, NABS Back:   normal alignment, no CVAT Skin:  no rash or induration seen on limited exam. Bruising on lower  legs +skin tenting. Capillary refill <2 seconds  Musculoskeletal:  grossly normal tone BUE/BLE, good ROM, no bony abnormality Lower extremity:  No LE edema.  Limited foot exam with no ulcerations.  2+ distal pulses. Psychiatric:  grossly normal mood and affect, speech fluent and appropriate, Aox1. Does know it's may. Property names things.  Neurologic:  CN 2-12 grossly intact, moves all extremities in coordinated fashion, sensation intact   Radiological Exams on Admission: Independently reviewed - see discussion in A/P where applicable  X-ray chest PA and lateral  Result Date: 10/07/2021 CLINICAL DATA:  Altered mental status. EXAM: CHEST - 2 VIEW COMPARISON:  10/01/2021. FINDINGS: Mild enlargement of the cardiopericardial silhouette. Prominent thoracic aorta. Moderate-sized hiatal hernia. No mediastinal or hilar masses. Linear lung base opacities consistent with atelectasis and/or scarring. Remainder of the lungs is clear. Surgical vascular clips on the left consistent with prior left breast surgery, stable. Multiple vertebral compression fractures treated with vertebroplasty. Skeletal structures are diffusely demineralized. No convincing acute skeletal abnormality. IMPRESSION: No acute cardiopulmonary disease. Electronically Signed   By: Lajean Manes M.D.   On: 10/07/2021 18:35   CT Head Wo Contrast  Result Date: 10/07/2021 CLINICAL DATA:  Altered level of consciousness EXAM: CT HEAD WITHOUT CONTRAST TECHNIQUE: Contiguous axial images were obtained from the base of the skull through the vertex without intravenous contrast. RADIATION DOSE REDUCTION: This exam was performed according to the departmental dose-optimization program which includes automated exposure control, adjustment of the mA and/or kV according to patient size and/or use of iterative reconstruction technique. COMPARISON:  10/05/2021 FINDINGS: Brain: Chronic ischemic changes are again seen throughout the periventricular white matter,  inferior right frontal cortex, and right thalamus. Hypodensity within the left thalamus compatible with age indeterminate infarct unchanged since prior exam. No new areas of infarction or hemorrhage. Lateral ventricles and remaining midline structures are unremarkable. No acute extra-axial fluid collections. No mass effect. Vascular: Stable atherosclerosis.  No hyperdense vessel. Skull: Normal. Negative for fracture or focal lesion. Sinuses/Orbits: No acute finding. Other: None. IMPRESSION: 1. Stable age-indeterminate left thalamic lacunar infarct. 2. Chronic ischemic changes throughout the periventricular white matter, inferior right frontal cortex, and right thalamus. 3. No acute hemorrhage. Electronically Signed   By: Randa Ngo M.D.   On: 10/07/2021 15:07    EKG: Independently reviewed.  NSR with rate 78; nonspecific ST changes with no evidence of acute ischemia   Labs on Admission: I have personally reviewed the available labs and imaging studies at the time of the admission.  Pertinent labs:   wbc: 11.6,  hgb: 16.4/hct: 51.4,  ammonia 47,  UA: 20 ketones,  vbg: pH: 7.409,   Assessment and Plan: Principal Problem:   Acute encephalopathy Active Problems:   Compression fracture of T7 vertebra    Polycythemia   Mild CAD   Essential hypertension   Chronic diastolic CHF (congestive heart failure) ([facility])   PE (pulmonary thromboembolism) ([facility])   GERD (gastroesophageal reflux disease)   ANXIETY DEPRESSION    Assessment and Plan: * Acute encephalopathy 88 year old female with history of 2 days of acute confusion -observation to telemetry -CT head with no acute finding -will check MRI if she will tolerate this -history of ICB in 2021, stopped keppra per PCP notes on her own in 6/21; however, daughter states they did not keep her  on this  -confusion has seemed to improve in ED. Knows who she is and answers questions appropriately  -do not think infectious cause is source. lab work up  so far unrevealing. UA with no obvious infection, culture pending, CXR with no acute finding. No fever/chills. Mild elevation in WBC but likely reactive.  -ammonia only minimally elevated with normal transaminase levels  -will check metabolic labs -appears dehydrated with dry mucous membranes, skin tenting, ketones in urine and poor PO intake  Will give gentle, time limited IVF and have nutrition see her  -? If polypharmacy playing a role as she has been started on oxycodone and has been taking this 6x/day for a week. Will do trial of scheduled tramadol for pain with small amount of morphine for breakthrough pain -delirium precautions -neurology consulted in ED, f/u on recs  Compression fracture of T7 vertebra  -discussed with on call neurosurgery who did not feel off reading CT scan on 5/21 that this was an acute fracture. Could not see MRI film done at [facility]. Did not feel like based off imaging her pain would be out of control  -stopping oxycodone and will see If tramadol controls pain in hopes to see if AMS clears up -small amount of morphine for breatkthrough pain only  -lidocaine patch to back  -PT/SW as she may need higher level of care   (hx of chronic T1, T4, T8, T9, T10, and T12 compression fractures - most previously augmented)  Polycythemia Follows with oncology  Phlebotomy to maintain hematocrit below 45% hct is 50% today, but I do think she is hemoconcentrated. IVF and repeat cbc in AM, may need phlebotomy while inpatient  IV Iron as needed EC ASA 162 mg po q day (per MAR only on '81mg'$  ASA)  Mild CAD No chest pain Continue lopressor Intolerant to most medicines including Zetia, Pravachol, Niaspan hx of Takotsubo cardiomyopathy 1990 with normal coronary arteriography, repeat coronary angiography in 2003 with mild nonobstructive CAD, cardiomyopathy initial ejection fraction 40% subsequently recovered in January 2020 60%   Essential hypertension Continue home  lopressor Elevated, but confused and in pain Will do prn IV parameters as well   Chronic diastolic CHF (congestive heart failure) ([place]) Echo 05/2018 with normal EF, grade 1 DD She is volume down on exam, no signs of HF Strict I/O   PE (pulmonary thromboembolism) ([place]) Hx of PE in 11/2020. Recent CTA showed this was resolved on 10/01/21 On no AC due to falls   GERD (gastroesophageal reflux disease) Change prn PPI to daily with large hiatal hernia   ANXIETY DEPRESSION Continue her ativan BID scheduled    Advance Care Planning:   Code Status: Full Code   Consults: neurology  DVT Prophylaxis: TED/SCD   Family Communication: updated daughter by phone: celia hunterr   Severity of Illness: The appropriate patient status for this patient is OBSERVATION. Observation status is judged to be reasonable and necessary in order to provide the required intensity of service to ensure the patient's safety. The patient's presenting symptoms, physical exam findings, and initial radiographic and laboratory data in the context of their medical condition is felt to place them at decreased risk for further clinical deterioration. Furthermore, it is anticipated that the patient will be medically stable for discharge from the hospital within 2 midnights of admission.   Author: Orma Flaming, MD 10/07/2021 7:29 PM  For on call review [email or web address].

## 2021-10-07 NOTE — Assessment & Plan Note (Addendum)
88 year old female with history of 2 days of acute confusion -observation to telemetry -CT head with no acute finding -will check MRI if she will tolerate this -history of ICB in 2021, stopped keppra per PCP notes on her own in 6/21; however, daughter states they did not keep her on this  -confusion has seemed to improve in ED. Knows who she is and answers questions appropriately  -do not think infectious cause is source. lab work up so far unrevealing. UA with no obvious infection, culture pending, CXR with no acute finding. No fever/chills. Mild elevation in WBC but likely reactive.  -ammonia only minimally elevated with normal transaminase levels  -will check metabolic labs -appears dehydrated with dry mucous membranes, skin tenting, ketones in urine and poor PO intake  Will give gentle, time limited IVF and have nutrition see her  -? If polypharmacy playing a role as she has been started on oxycodone and has been taking this 6x/day for a week. Will do trial of scheduled tramadol for pain with small amount of morphine for breakthrough pain -delirium precautions -neurology consulted in ED, f/u on recs

## 2021-10-07 NOTE — Assessment & Plan Note (Signed)
Echo 05/2018 with normal EF, grade 1 DD She is volume down on exam, no signs of HF Strict I/O

## 2021-10-07 NOTE — Assessment & Plan Note (Addendum)
Follows with oncology  Phlebotomy to maintain hematocrit below 45% hct is 50% today, but I do think she is hemoconcentrated. IVF and repeat cbc in AM, may need phlebotomy while inpatient  IV Iron as needed EC ASA 162 mg po q day (per MAR only on '81mg'$  ASA)

## 2021-10-07 NOTE — Assessment & Plan Note (Addendum)
Change prn PPI to daily with large hiatal hernia

## 2021-10-07 NOTE — ED Notes (Signed)
Family at bedside. 

## 2021-10-07 NOTE — ED Notes (Signed)
Patient repeadetly trying to get out of bed and sticking legs through stretcher railing. Padded railings placed and patient redirected back into bed. Call light within reach. Agricultural consultant notified.

## 2021-10-07 NOTE — Assessment & Plan Note (Addendum)
-  discussed with on call neurosurgery who did not feel off reading CT scan on 5/21 that this was an acute fracture. Could not see MRI film done at [facility]. Did not feel like based off imaging her pain would be out of control  -stopping oxycodone and will see If tramadol controls pain in hopes to see if AMS clears up -small amount of morphine for breatkthrough pain only  -lidocaine patch to back  -PT/SW as she may need higher level of care   (hx of chronic T1, T4, T8, T9, T10, and T12 compression fractures - most previously augmented)

## 2021-10-07 NOTE — ED Notes (Signed)
Transported to XR  

## 2021-10-07 NOTE — ED Notes (Signed)
ED Provider at bedside. 

## 2021-10-07 NOTE — Assessment & Plan Note (Signed)
Continue her ativan BID scheduled

## 2021-10-07 NOTE — Assessment & Plan Note (Signed)
Continue home lopressor Elevated, but confused and in pain Will do prn IV parameters as well

## 2021-10-07 NOTE — ED Provider Notes (Signed)
[facility] EMERGENCY DEPARTMENT Provider Note   CSN: [id number] Arrival date & time: 10/07/21  1054     History  Chief Complaint  Patient presents with   Altered Mental Status    LILIANA DANG is a 88 y.o. female.  HPI  88 year old female presents to the emergency department concern for altered mental status.  Patient recently had a fall, was diagnosed with a thoracic compression fracture, has been placed on p.o. pain medicine.  Recently moved into an independent living facility.  Over the last day has had a significant decline in her mental status.  She was found altered on the couch outside of her apartment not acting like herself.  Patient complains of pain in her back but the daughter who is now at bedside states that she is also been complaining of lower back/flank/rib pain.  Patient is otherwise a very poor historian secondary to mental status change.  Level 5 caveat.  Home Medications Prior to Admission medications   Medication Sig Start Date End Date Taking? Authorizing Provider  acetaminophen (TYLENOL) 500 MG tablet Take 500 mg by mouth every 6 (six) hours as needed for mild pain.   Yes [provider]  alendronate (FOSAMAX) 70 MG tablet TAKE 1 TABLET(70 MG) BY MOUTH EVERY 7 DAYS WITH A FULL GLASS OF WATER AND ON AN EMPTY STOMACH Patient taking differently: Take 70 mg by mouth every Sunday. 08/09/21  Yes Colon Branch, MD  aspirin EC 81 MG tablet Take 81 mg by mouth daily. Swallow whole.   Yes [provider]  furosemide (LASIX) 20 MG tablet Take 1 tablet (20 mg total) by mouth every other day. 03/09/21  Yes Richardo Priest, MD  Homeopathic Products (SINUS MEDICINE PO) Take 1 tablet by mouth every 6 (six) hours as needed (sinus congestion).   Yes [provider]  lactose free nutrition (BOOST) LIQD Take 237 mLs by mouth every other day.   Yes [provider]  LORazepam (ATIVAN) 0.5 MG tablet TAKE 1 TABLET(0.5 MG) BY MOUTH  TWICE DAILY AS NEEDED FOR ANXIETY Patient taking differently: Take 0.5 mg by mouth in the morning and at bedtime. 08/09/21  Yes Paz, Alda Berthold, MD  metoprolol tartrate (LOPRESSOR) 50 MG tablet TAKE 1 TABLET(50 MG) BY MOUTH TWICE DAILY Patient taking differently: Take 50 mg by mouth 2 (two) times daily. 08/08/21  Yes Paz, Alda Berthold, MD  nitroGLYCERIN (NITROSTAT) 0.4 MG SL tablet Place 1 tablet (0.4 mg total) under the tongue every 5 (five) minutes x 3 doses as needed for chest pain. 07/25/20  Yes Paz, Alda Berthold, MD  omeprazole (PRILOSEC) 20 MG capsule Take 1 capsule (20 mg total) by mouth daily. Patient taking differently: Take 20 mg by mouth daily as needed (heartburn). 10/05/19  Yes Lassen, Arlo C, PA-C  oxyCODONE (ROXICODONE) 5 MG immediate release tablet Take 1 tablet (5 mg total) by mouth every 4 (four) hours as needed for severe pain. 10/05/21  Yes Mesner, Corene Cornea, MD  valACYclovir (VALTREX) 500 MG tablet TAKE 2 TABLETS BY MOUTH AT ONSET OF RASH AND 2 TABLETS 12 HOURS LATER AS NEEDED Patient taking differently: Take 1,000 mg by mouth See admin instructions. Take at the onset of rash and repeat 12 hours later as needed. 02/17/21 02/17/22 Yes Ennever, Rudell Cobb, MD      Allergies    Penicillins, Valsartan, Atorvastatin, Carvedilol, Ezetimibe, Fluvastatin sodium, Magnesium hydroxide, Meloxicam, Pneumovax [pneumococcal polysaccharide vaccine], Quinapril hcl, Simvastatin, Topamax [topiramate], and Vit d-vit e-safflower  oil    Review of Systems   Review of Systems  Unable to perform ROS: Mental status change   Physical Exam Updated Vital Signs BP (!) 155/71   Pulse 81   Temp (!) 97.4 F (36.3 C) (Oral)   Resp 17   SpO2 98%  Physical Exam Vitals and nursing note reviewed.  Constitutional:      Appearance: Normal appearance. She is not diaphoretic.  HENT:     Head: Normocephalic.     Mouth/Throat:     Mouth: Mucous membranes are moist.  Cardiovascular:     Rate and Rhythm: Normal rate.  Pulmonary:      Effort: Pulmonary effort is normal. No respiratory distress.  Abdominal:     Palpations: Abdomen is soft.     Tenderness: There is no abdominal tenderness. There is no guarding.     Comments: B/l CVA TTP  Musculoskeletal:     Comments: Inconsistent midline middle back pain TTP  Skin:    General: Skin is warm.  Neurological:     Mental Status: She is alert. She is disoriented.  Psychiatric:        Mood and Affect: Mood normal.    ED Results / Procedures / Treatments   Labs (all labs ordered are listed, but only abnormal results are displayed) Labs Reviewed  COMPREHENSIVE METABOLIC PANEL - Abnormal; Notable for the following components:      Result Value   Sodium 134 (*)    Chloride 93 (*)    Glucose, Bld 123 (*)    Creatinine, Ser 1.01 (*)    Calcium 10.6 (*)    Total Bilirubin 1.6 (*)    GFR, Estimated 53 (*)    All other components within normal limits  CBC - Abnormal; Notable for the following components:   WBC 11.6 (*)    RBC 5.26 (*)    Hemoglobin 16.4 (*)    HCT 51.4 (*)    Platelets 496 (*)    All other components within normal limits  AMMONIA - Abnormal; Notable for the following components:   Ammonia 47 (*)    All other components within normal limits  I-STAT VENOUS BLOOD GAS, ED - Abnormal; Notable for the following components:   pO2, Ven 55 (*)    Bicarbonate 35.5 (*)    TCO2 37 (*)    Acid-Base Excess 8.0 (*)    Sodium 132 (*)    HCT 49.0 (*)    Hemoglobin 16.7 (*)    All other components within normal limits  URINALYSIS, ROUTINE W REFLEX MICROSCOPIC  CBG MONITORING, ED    EKG None  Radiology No results found.  Procedures Procedures    Medications Ordered in ED Medications  LORazepam (ATIVAN) tablet 0.5 mg (0.5 mg Oral Given 10/07/21 1229)  oxyCODONE (Oxy IR/ROXICODONE) immediate release tablet 5 mg (5 mg Oral Given 10/07/21 1405)    ED Course/ Medical Decision Making/ A&P                           Medical Decision Making Amount and/or  Complexity of Data Reviewed Labs: ordered. Radiology: ordered.  Risk Prescription drug management. Decision regarding hospitalization.   88 year old female presents emergency department with concern for altered mental status.  Patient lives in an independent living facility, was reportedly found outside on the couch and "out of her head" by a neighbor.  She arrives altered, at times combative.  Complaining of upper back pain.  Noted to have multiple compression fractions, osteoporosis, on oxycodone for pain control.  Daughter at bedside states that she has been having age-related memory changes but nothing in regards to acute mental status change like this.  She has never seen her like this.  Unclear if there is been any recent illness.  Patient is disoriented, difficult to redirect.  Blood work is reassuring, no significant leukocytosis, appears slightly dehydrated but no AKI/uremia.  Ammonia slightly elevated but normal liver function.  Urinalysis shows no UTI.  Head CT shows chronic changes.  She is nonfocal on exam.  After pain medicine on reevaluation patient did have a lucid period where she was alert and oriented to herself and place but did not recognize the daughter and was still "off" from her baseline per the daughter at bedside.  We will plan for medical admission for change in mental status, no etiology identified at this time.  Placed neurology call for nonurgent consult.  Low suspicion for infection/meningitis at this time given that she is afebrile without any other acute findings.  Never any reported seizure activity.  Patients evaluation and results requires admission for further treatment and care.  Spoke with hospitalist , reviewed patient's ED course and they accept admission.  Patient agrees with admission plan, offers no new complaints and is stable/unchanged at time of admit.        Final Clinical Impression(s) / ED Diagnoses Final diagnoses:  None    Rx / DC  Orders ED Discharge Orders     None         Lorelle Gibbs, DO 10/07/21 1901

## 2021-10-08 DIAGNOSIS — I10 Essential (primary) hypertension: Secondary | ICD-10-CM | POA: Diagnosis not present

## 2021-10-08 DIAGNOSIS — I5032 Chronic diastolic (congestive) heart failure: Secondary | ICD-10-CM

## 2021-10-08 DIAGNOSIS — G9340 Encephalopathy, unspecified: Secondary | ICD-10-CM | POA: Diagnosis not present

## 2021-10-08 DIAGNOSIS — I2699 Other pulmonary embolism without acute cor pulmonale: Secondary | ICD-10-CM

## 2021-10-08 DIAGNOSIS — G9341 Metabolic encephalopathy: Secondary | ICD-10-CM | POA: Diagnosis not present

## 2021-10-08 LAB — BASIC METABOLIC PANEL
Anion gap: 9 (ref 5–15)
BUN: 17 mg/dL (ref 8–23)
CO2: 30 mmol/L (ref 22–32)
Calcium: 8.7 mg/dL — ABNORMAL LOW (ref 8.9–10.3)
Chloride: 94 mmol/L — ABNORMAL LOW (ref 98–111)
Creatinine, Ser: 0.73 mg/dL (ref 0.44–1.00)
GFR, Estimated: >60 mL/min (ref >60)
Glucose, Bld: 103 mg/dL — ABNORMAL HIGH (ref 70–99)
Potassium: 2.9 mmol/L — ABNORMAL LOW (ref 3.5–5.1)
Sodium: 133 mmol/L — ABNORMAL LOW (ref 135–145)

## 2021-10-08 LAB — VITAMIN B12: Vitamin B-12: 326 pg/mL (ref 180–914)

## 2021-10-08 LAB — CK: Total CK: 23 U/L — ABNORMAL LOW (ref 38–234)

## 2021-10-08 LAB — CBC
HCT: 42.3 % (ref 36.0–46.0)
Hemoglobin: 14.3 g/dL (ref 12.0–15.0)
MCH: 32 pg (ref 26.0–34.0)
MCHC: 33.8 g/dL (ref 30.0–36.0)
MCV: 94.6 fL (ref 80.0–100.0)
Platelets: 391 10*3/uL (ref 150–400)
RBC: 4.47 MIL/uL (ref 3.87–5.11)
RDW: 13.1 % (ref 11.5–15.5)
WBC: 6.4 10*3/uL (ref 4.0–10.5)
nRBC: 0 % (ref 0.0–0.2)

## 2021-10-08 LAB — TSH: TSH: 2.312 u[IU]/mL (ref 0.350–4.500)

## 2021-10-08 MED ORDER — LACTULOSE 10 GM/15ML PO SOLN: 20.0000 g | Freq: Every day | ORAL | Status: DC | PRN

## 2021-10-08 MED ORDER — LORAZEPAM 0.5 MG PO TABS
0.5000 mg | ORAL_TABLET | Freq: Two times a day (BID) | ORAL | Status: DC | PRN
  Administered 2021-10-08 – 2021-10-10 (×4): 0.5 mg via ORAL
  Filled 2021-10-08 (×4): qty 1

## 2021-10-08 MED ORDER — TRAMADOL HCL 50 MG PO TABS
50.0000 mg | ORAL_TABLET | Freq: Four times a day (QID) | ORAL | Status: DC | PRN
  Administered 2021-10-08 – 2021-10-10 (×3): 50 mg via ORAL
  Filled 2021-10-08 (×3): qty 1

## 2021-10-08 MED ORDER — POTASSIUM CHLORIDE CRYS ER 20 MEQ PO TBCR
40.0000 meq | EXTENDED_RELEASE_TABLET | ORAL | Status: AC
  Administered 2021-10-08 (×2): 40 meq via ORAL
  Filled 2021-10-08 (×2): qty 2

## 2021-10-08 MED ORDER — MORPHINE SULFATE (PF) 2 MG/ML IV SOLN
0.5000 mg | INTRAVENOUS | Status: DC | PRN
  Administered 2021-10-09: 0.5 mg via INTRAVENOUS
  Filled 2021-10-08: qty 1

## 2021-10-08 MED ORDER — ACETAMINOPHEN 500 MG PO TABS
1000.0000 mg | ORAL_TABLET | Freq: Three times a day (TID) | ORAL | Status: DC
  Administered 2021-10-08 – 2021-10-09 (×5): 1000 mg via ORAL
  Filled 2021-10-08 (×5): qty 2

## 2021-10-08 NOTE — Progress Notes (Signed)
Just received call from MRI pt downstairs, fidgety, restless, continues to complain of pain. Given tramadol (approximately an hour ago) and ativan (approximately 20 minutes ago). MRI staff  requesting anything else that she may have to address pain to get through scan, pt having difficulty lying flat for MRI. Dr. Cyndia Diver notified, received new order for morphine IV as needed.

## 2021-10-08 NOTE — NC FL2 (Signed)
[place] LEVEL OF CARE SCREENING TOOL     IDENTIFICATION  Patient Name: [name] Birthdate: 1929/12/04 Sex: female Admission Date (Current Location): 10/07/2021  [facility] and [place] Number:  Herbalist and Address:  [facility], [place] [address] [place], [place] [address]      Provider Number: [id number]  Attending Physician Name and Address:  Mariel Aloe, MD  Relative Name and Phone Number:       Current Level of Care: Hospital Recommended Level of Care: [place] Prior Approval Number:    Date Approved/Denied:   PASRR Number: [id number]  Discharge Plan: SNF    Current Diagnoses: Patient Active Problem List   Diagnosis Date Noted   Acute encephalopathy 10/07/2021   Compression fracture of T7 vertebra  10/07/2021   Rib pain on right side 01/24/2021   Skin rash 01/24/2021   Mastalgia 01/24/2021   PE (pulmonary thromboembolism) ([place]) 12/25/2020   Lumbar compression fracture ([place]) 11/16/2020   Generalized weakness 10/03/2020   GERD (gastroesophageal reflux disease)    Dyspnea    DVT (deep venous thrombosis) (HCC)    Diverticulitis    CAD (coronary artery disease)    Acute midline thoracic back pain 03/04/2020   Pressure injury of skin 12/27/2019   S/p left hip fracture    Comminuted fracture of hip, left, open type I or II, initial encounter ([place]) 12/24/2019   Closed left hip fracture ([place]) 12/24/2019   ICH (intracerebral hemorrhage) ([place]) 11/04/2019   Intractable back pain 09/13/2019   Compression fracture of T9 vertebra ([place]) 09/13/2019   Chronic diastolic CHF (congestive heart failure) ([place]) 09/13/2019   Overweight (BMI 25.0-29.9) 09/13/2019   Constipation 09/13/2019   Raynaud's phenomenon without gangrene 05/19/2018   Iron deficiency anemia due to chronic blood loss 12/30/2017   Insomnia 06/25/2017   Mild CAD 05/24/2017   PCP NOTES >>>>>>>>> 02/15/2015   Superficial  thrombophlebitis 04/06/2014   Annual physical exam 08/18/2012   Breast cancer ([place]) 11/05/2011   Polycythemia 11/05/2011   Stroke ([place]) 03/29/2011   OSA (obstructive sleep apnea) 03/29/2011   OA (osteoarthritis) of knee 03/29/2011   Hiatal hernia 03/29/2011   E. coli gastroenteritis 03/29/2011   Depression with anxiety 03/29/2011   Chest pain 01/16/2011   TIA (transient ischemic attack) 01/16/2011   CARDIOMYOPATHY 08/03/2009   DJD -- pain mgmt 08/16/2008   ABDOMINAL PAIN, LEFT LOWER QUADRANT 07/05/2008   GAIT DISTURBANCE 10/21/2007   ANXIETY DEPRESSION 10/08/2007   Fracture 09/10/2007   Hyperlipidemia 06/25/2007   Takotsubo cardiomyopathy 06/25/2007   GERD without esophagitis 06/25/2007   Essential hypertension 09/30/2006   Senile osteoporosis 09/30/2006   Migraine with aura 09/30/2006   Hyperplastic colon polyp 06/2001    Orientation RESPIRATION BLADDER Height & Weight     Self, Situation, Place, Time (intermittent confusion)  Normal Continent Weight:   Height:     BEHAVIORAL SYMPTOMS/MOOD NEUROLOGICAL BOWEL NUTRITION STATUS      Continent    AMBULATORY STATUS COMMUNICATION OF NEEDS Skin   Limited Assist Verbally Normal                       Personal Care Assistance Level of Assistance  Bathing, Feeding Bathing Assistance: Limited assistance Feeding assistance: Limited assistance Dressing Assistance: Limited assistance     Functional Limitations Info  Sight, Hearing, Speech Sight Info: Impaired Hearing Info: Adequate Speech Info: Adequate    SPECIAL CARE FACTORS FREQUENCY  PT (By  licensed PT), OT (By licensed OT)                    Contractures Contractures Info: Not present    Additional Factors Info  Code Status Code Status Info: FULL CODE             Current Medications (10/08/2021):  This is the current hospital active medication list Current Facility-Administered Medications  Medication Dose Route Frequency Provider Last Rate Last  Admin   acetaminophen (TYLENOL) tablet 1,000 mg  1,000 mg Oral TID Mariel Aloe, MD   1,000 mg at 10/08/21 1545   aspirin EC tablet 81 mg  81 mg Oral Daily Orma Flaming, MD   81 mg at 10/08/21 0825   hydrALAZINE (APRESOLINE) injection 5 mg  5 mg Intravenous Q8H PRN Orma Flaming, MD       lactulose (CHRONULAC) 10 GM/15ML solution 20 g  20 g Oral Daily PRN Mariel Aloe, MD       lidocaine (LIDODERM) 5 % 1 patch  1 patch Transdermal Q24H Orma Flaming, MD   1 patch at 10/07/21 2016   LORazepam (ATIVAN) tablet 0.5 mg  0.5 mg Oral BID PRN Mariel Aloe, MD       metoprolol tartrate (LOPRESSOR) tablet 50 mg  50 mg Oral BID Orma Flaming, MD   50 mg at 10/08/21 0825   morphine (PF) 2 MG/ML injection 0.5 mg  0.5 mg Intravenous Q3H PRN Mariel Aloe, MD       ondansetron [facility]) tablet 4 mg  4 mg Oral Q6H PRN Orma Flaming, MD       Or   ondansetron [facility]) injection 4 mg  4 mg Intravenous Q6H PRN Orma Flaming, MD       pantoprazole (PROTONIX) EC tablet 40 mg  40 mg Oral Daily Orma Flaming, MD   40 mg at 10/08/21 0825   potassium chloride SA (KLOR-CON M) CR tablet 40 mEq  40 mEq Oral Q4H Mariel Aloe, MD   40 mEq at 10/08/21 1545   traMADol (ULTRAM) tablet 50 mg  50 mg Oral Q6H PRN Mariel Aloe, MD         Discharge Medications: Please see discharge summary for a list of discharge medications.  Relevant Imaging Results:  Relevant Lab Results:   Additional Information SS#: [id number]  Amador Cunas, [place]

## 2021-10-08 NOTE — Progress Notes (Signed)
Pt requiring constant redirection due to attempting to get out of bed unassisted, Confused, oriented to self and place. Dr. Hal Hope notified, received order for telesitter monitoring.

## 2021-10-08 NOTE — Progress Notes (Signed)
Pt is very confused and keeps trying to get OOB. Pt asked for her anxiety medication to be given. Medication has been given, but is ineffective at this time. Pt has been brought up to the nurses station in her recliner so this RN and staff can continue to monitor pt to help reduce the chance of a fall.  Foster Simpson Margaurite Salido

## 2021-10-08 NOTE — TOC Initial Note (Signed)
Transition of Care [facility]) - Initial/Assessment Note    Patient Details  Name: [name] MRN: [id number] Date of Birth: 31-Dec-1929  [facility] [place] [facility] CM/SW Contact:    Bartholomew Crews, RN Phone Number: [phone] 10/08/2021, 4:03 PM  Clinical Narrative:                  Spoke with patient's daughter, [name], on her mobile phone d/t patient current state of confusion to discuss post acute transition. Patient has been living in independent living. Now recommended for SNF. Early Chars expresses concern for patient's pain stating it may be chronic but use of narcotics is worsening her mental status. Discussed recommendations for SNF, Early Chars is agreeable - first choices are [facility], [facility], and [facility]; however, Early Chars understands that it depends on bed availability. TOC to initiate SNF search.   Expected Discharge Plan: Skilled Nursing Facility Barriers to Discharge: Continued Medical Work up   Patient Goals and CMS Choice Patient states their goals for this hospitalization and ongoing recovery are:: SNF rehab CMS Medicare.gov Compare Post Acute Care list provided to:: Patient Represenative (must comment) [name]) Choice offered to / list presented to : Adult Children  Expected Discharge Plan and Services Expected Discharge Plan: Skilled Nursing Facility In-house Referral: Clinical Social Work Discharge Planning Services: CM Consult Post Acute Care Choice: [place] Living arrangements for the past 2 months: [place]                 DME Arranged: N/A DME Agency: NA       HH Arranged: NA [place] Agency: NA        Prior Living Arrangements/Services Living arrangements for the past 2 months: [place] Lives with:: Facility Resident Patient language and need for interpreter reviewed:: Yes            Current home services: DME (RW, cane) Criminal Activity/Legal Involvement Pertinent to Current  Situation/Hospitalization: No - Comment as needed  Activities of Daily Living Home Assistive Devices/Equipment: Cane (specify quad or straight), Eyeglasses, Dentures (specify type), Walker (specify type) ADL Screening (condition at time of admission) Patient's cognitive ability adequate to safely complete daily activities?: No Is the patient deaf or have difficulty hearing?: Yes Does the patient have difficulty seeing, even when wearing glasses/contacts?: No Does the patient have difficulty concentrating, remembering, or making decisions?: Yes Patient able to express need for assistance with ADLs?: Yes Does the patient have difficulty dressing or bathing?: Yes Independently performs ADLs?: No Does the patient have difficulty walking or climbing stairs?: Yes Weakness of Legs: Both Weakness of Arms/Hands: None  Permission Sought/Granted                  Emotional Assessment       Orientation: : Oriented to Self Alcohol / Substance Use: Not Applicable Psych Involvement: No (comment)  Admission diagnosis:  Acute encephalopathy [G93.40] Altered mental status, unspecified altered mental status type [R41.82] Patient Active Problem List   Diagnosis Date Noted   Acute encephalopathy 10/07/2021   Compression fracture of T7 vertebra  10/07/2021   Rib pain on right side 01/24/2021   Skin rash 01/24/2021   Mastalgia 01/24/2021   PE (pulmonary thromboembolism) ([place]) 12/25/2020   Lumbar compression fracture ([place]) 11/16/2020   Generalized weakness 10/03/2020   GERD (gastroesophageal reflux disease)    Dyspnea    DVT (deep venous thrombosis) (HCC)    Diverticulitis    CAD (coronary artery disease)  Acute midline thoracic back pain 03/04/2020   Pressure injury of skin 12/27/2019   S/p left hip fracture    Comminuted fracture of hip, left, open type I or II, initial encounter ([place]) 12/24/2019   Closed left hip fracture ([place]) 12/24/2019   ICH (intracerebral hemorrhage) ([place])  11/04/2019   Intractable back pain 09/13/2019   Compression fracture of T9 vertebra ([place]) 09/13/2019   Chronic diastolic CHF (congestive heart failure) ([place]) 09/13/2019   Overweight (BMI 25.0-29.9) 09/13/2019   Constipation 09/13/2019   Raynaud's phenomenon without gangrene 05/19/2018   Iron deficiency anemia due to chronic blood loss 12/30/2017   Insomnia 06/25/2017   Mild CAD 05/24/2017   PCP NOTES >>>>>>>>> 02/15/2015   Superficial thrombophlebitis 04/06/2014   Annual physical exam 08/18/2012   Breast cancer ([place]) 11/05/2011   Polycythemia 11/05/2011   Stroke ([place]) 03/29/2011   OSA (obstructive sleep apnea) 03/29/2011   OA (osteoarthritis) of knee 03/29/2011   Hiatal hernia 03/29/2011   E. coli gastroenteritis 03/29/2011   Depression with anxiety 03/29/2011   Chest pain 01/16/2011   TIA (transient ischemic attack) 01/16/2011   CARDIOMYOPATHY 08/03/2009   DJD -- pain mgmt 08/16/2008   ABDOMINAL PAIN, LEFT LOWER QUADRANT 07/05/2008   GAIT DISTURBANCE 10/21/2007   ANXIETY DEPRESSION 10/08/2007   Fracture 09/10/2007   Hyperlipidemia 06/25/2007   Takotsubo cardiomyopathy 06/25/2007   GERD without esophagitis 06/25/2007   Essential hypertension 09/30/2006   Senile osteoporosis 09/30/2006   Migraine with aura 09/30/2006   Hyperplastic colon polyp 06/2001   PCP:  Colon Branch, MD Pharmacy:   [facility] [phone] [address], [address] AT [facility] [address] [place] [address] [place] [address] [place] [address] Phone: [phone] Fax: [phone]     Social Determinants of Health (SDOH) Interventions    Readmission Risk Interventions    09/14/2019   11:11 AM  Readmission Risk Prevention Plan  Medication Screening Complete  Transportation Screening Complete

## 2021-10-08 NOTE — Progress Notes (Signed)
Per CM, pt agreeable to SNF and prefers [facility], [facility], or [facility]. SNF search started, will f/u with offers as available. [place] auth request submitted (ref # [id number]) with accepting SNF as TBD. SW will provide updates as available.   Wandra Feinstein, MSW, LCSW [phone] (coverage)

## 2021-10-08 NOTE — Progress Notes (Signed)
Pt admitted to room 5M14, oriented to call light system, instructed not to get up unassisted. Floor mats in place, bed in lowest position, alarm intact.

## 2021-10-08 NOTE — Progress Notes (Signed)
PROGRESS NOTE    [name]  ZHG:[id number] DOB: [birth year] DOA: 10/07/2021 PCP: Colon Branch, MD   Brief Narrative: [name] is a 88 y.o. female with a history of CAD, diastolic heart failure, CVA, DVT who presented for worsening confusion. No obvious etiology except possible side effect of recent narcotics prescribed for back pain.   Assessment and Plan:  Acute toxic/metabolic encephalopathy No obvious etiology, except likely polypharmacy. CT head and MRI brain without acute process. Urinalysis unremarkable. Normal WBC. Mildly elevated ammonia. Confusion appears to be improving/improved. -Discontinue scheduled narcotics  T7 Compression fracture Recently diagnosed and started on analgesic therapy. Neurosurgery consulted and did not think this was an acute fracture. History of T1, T4, T9, T9, T10 and T12 compression fractures. Patient has a history of falls. Patient started on Tramadol on admission. -Change to Tramadol PRN and start Tylenol 100 mg TID  Polycythemia Chronic issue. Hemoglobin/hematocrit within normal range now. Patient follows with heme/onc as an outpatient.  CAD Patient is on aspirin as an outpatient. -Continue aspirin  Primary hypertension -Contine home metoprolol  Chronic diastolic heart failure Stable.  History of pulmonary embolism Noted. Patient is not on anticoagulation in setting of frequent falls  GERD Continue Protonix (substituted for home Prilosec)  Anxiety Depression -Continue home lorazepam  DVT prophylaxis: SCDs Code Status:   Code Status: Full Code Family Communication: Daughter on telephone Disposition Plan: Discharge to SNF when bed is available   Consultants:  Neurosurgery (curbside)  Procedures:  None  Antimicrobials: None    Subjective: Patient reports no concerns at this time. Pain is controlled.  Objective: BP 122/75 (BP Location: Right Arm)   Pulse 72   Temp 98.2 F (36.8 C) (Oral)   Resp 16    SpO2 96%   Examination:  General exam: Appears calm and comfortable Respiratory system: Clear to auscultation. Respiratory effort normal. Cardiovascular system: S1 & S2 heard, RRR. No murmurs. Gastrointestinal system: Abdomen is nondistended, soft and nontender. Normal bowel sounds heard. Central nervous system: Alert and oriented to self. Musculoskeletal: No edema. No calf tenderness Skin: No cyanosis. No rashes   Data Reviewed: I have personally reviewed following labs and imaging studies  CBC Lab Results  Component Value Date   WBC 6.4 10/08/2021   RBC 4.47 10/08/2021   HGB 14.3 10/08/2021   HCT 42.3 10/08/2021   MCV 94.6 10/08/2021   MCH 32.0 10/08/2021   PLT 391 10/08/2021   MCHC 33.8 10/08/2021   RDW 13.1 10/08/2021   LYMPHSABS 0.6 (L) 10/05/2021   MONOABS 0.9 10/05/2021   EOSABS 0.2 10/05/2021   BASOSABS 0.0 19/62/2297     Last metabolic panel Lab Results  Component Value Date   NA 133 (L) 10/08/2021   K 2.9 (L) 10/08/2021   CL 94 (L) 10/08/2021   CO2 30 10/08/2021   BUN 17 10/08/2021   CREATININE 0.73 10/08/2021   GLUCOSE 103 (H) 10/08/2021   GFRNONAA >60 10/08/2021   GFRAA NOT CALCULATED 12/27/2019   CALCIUM 8.7 (L) 10/08/2021   PROT 6.8 10/07/2021   ALBUMIN 3.9 10/07/2021   LABGLOB 2.7 10/04/2016   AGRATIO 1.6 10/04/2016   BILITOT 1.6 (H) 10/07/2021   ALKPHOS 50 10/07/2021   AST 21 10/07/2021   ALT 9 10/07/2021   ANIONGAP 9 10/08/2021    GFR: Estimated Creatinine Clearance: 39.2 mL/min (by C-G formula based on SCr of 0.73 mg/dL).  Recent Results (from the past 240 hour(s))  Resp Panel by RT-PCR (Flu A&B, Covid) Nasopharyngeal  Swab     Status: None   Collection Time: 10/01/21  1:52 AM   Specimen: Nasopharyngeal Swab; Nasopharyngeal(NP) swabs in vial transport medium  Result Value Ref Range Status   SARS Coronavirus 2 by RT PCR NEGATIVE NEGATIVE Final    Comment: (NOTE) SARS-CoV-2 target nucleic acids are NOT DETECTED.  The SARS-CoV-2 RNA  is generally detectable in upper respiratory specimens during the acute phase of infection. The lowest concentration of SARS-CoV-2 viral copies this assay can detect is 138 copies/mL. A negative result does not preclude SARS-Cov-2 infection and should not be used as the sole basis for treatment or other patient management decisions. A negative result may occur with  improper specimen collection/handling, submission of specimen other than nasopharyngeal swab, presence of viral mutation(s) within the areas targeted by this assay, and inadequate number of viral copies(<138 copies/mL). A negative result must be combined with clinical observations, patient history, and epidemiological information. The expected result is Negative.  Fact Sheet for Patients:  EntrepreneurPulse.com.au  Fact Sheet for Healthcare Providers:  IncredibleEmployment.be  This test is no t yet approved or cleared by the [place] FDA and  has been authorized for detection and/or diagnosis of SARS-CoV-2 by FDA under an Emergency Use Authorization (EUA). This EUA will remain  in effect (meaning this test can be used) for the duration of the COVID-19 declaration under Section 564(b)(1) of the Act, 21 U.S.C.section 360bbb-3(b)(1), unless the authorization is terminated  or revoked sooner.       Influenza A by PCR NEGATIVE NEGATIVE Final   Influenza B by PCR NEGATIVE NEGATIVE Final    Comment: (NOTE) The Xpert Xpress SARS-CoV-2/FLU/RSV plus assay is intended as an aid in the diagnosis of influenza from Nasopharyngeal swab specimens and should not be used as a sole basis for treatment. Nasal washings and aspirates are unacceptable for Xpert Xpress SARS-CoV-2/FLU/RSV testing.  Fact Sheet for Patients: EntrepreneurPulse.com.au  Fact Sheet for Healthcare Providers: IncredibleEmployment.be  This test is not yet approved or cleared by the  [place] FDA and has been authorized for detection and/or diagnosis of SARS-CoV-2 by FDA under an Emergency Use Authorization (EUA). This EUA will remain in effect (meaning this test can be used) for the duration of the COVID-19 declaration under Section 564(b)(1) of the Act, 21 U.S.C. section 360bbb-3(b)(1), unless the authorization is terminated or revoked.  Performed at [facility], [place] [address] [place], [place] [address]       Radiology Studies: X-ray chest PA and lateral  Result Date: 10/07/2021 CLINICAL DATA:  Altered mental status. EXAM: CHEST - 2 VIEW COMPARISON:  10/01/2021. FINDINGS: Mild enlargement of the cardiopericardial silhouette. Prominent thoracic aorta. Moderate-sized hiatal hernia. No mediastinal or hilar masses. Linear lung base opacities consistent with atelectasis and/or scarring. Remainder of the lungs is clear. Surgical vascular clips on the left consistent with prior left breast surgery, stable. Multiple vertebral compression fractures treated with vertebroplasty. Skeletal structures are diffusely demineralized. No convincing acute skeletal abnormality. IMPRESSION: No acute cardiopulmonary disease. Electronically Signed   By: Lajean Manes M.D.   On: 10/07/2021 18:35   CT Head Wo Contrast  Result Date: 10/07/2021 CLINICAL DATA:  Altered level of consciousness EXAM: CT HEAD WITHOUT CONTRAST TECHNIQUE: Contiguous axial images were obtained from the base of the skull through the vertex without intravenous contrast. RADIATION DOSE REDUCTION: This exam was performed according to the departmental dose-optimization program which includes automated exposure control, adjustment of the mA and/or kV according to patient size and/or use  of iterative reconstruction technique. COMPARISON:  10/05/2021 FINDINGS: Brain: Chronic ischemic changes are again seen throughout the periventricular white matter, inferior right frontal cortex, and right thalamus. Hypodensity within  the left thalamus compatible with age indeterminate infarct unchanged since prior exam. No new areas of infarction or hemorrhage. Lateral ventricles and remaining midline structures are unremarkable. No acute extra-axial fluid collections. No mass effect. Vascular: Stable atherosclerosis.  No hyperdense vessel. Skull: Normal. Negative for fracture or focal lesion. Sinuses/Orbits: No acute finding. Other: None. IMPRESSION: 1. Stable age-indeterminate left thalamic lacunar infarct. 2. Chronic ischemic changes throughout the periventricular white matter, inferior right frontal cortex, and right thalamus. 3. No acute hemorrhage. Electronically Signed   By: Randa Ngo M.D.   On: 10/07/2021 15:07   MR BRAIN WO CONTRAST  Result Date: 10/07/2021 CLINICAL DATA:  Confusion EXAM: MRI HEAD WITHOUT CONTRAST TECHNIQUE: Multiplanar, multiecho pulse sequences of the brain and surrounding structures were obtained without intravenous contrast. COMPARISON:  07/19/2018 MRI, correlation is also made with CT head 10/07/2021 FINDINGS: Evaluation is limited as the patient was confused and had difficulty remaining still, as well as trying to remove the coil, causing early termination of the exam. Only diffusion-weighted sequences, sagittal T1, and axial T2 were obtained. Brain: No restricted diffusion to suggest acute or subacute infarct. No acute hemorrhage, mass, mass effect, or midline shift. Confluent T2 hyperintense signal in the periventricular white matter, likely the sequela of severe chronic small vessel ischemic disease. Redemonstrated moderate size remote right thalamic infarct. Lacunar infarcts in the left cerebellum, bilateral corona radiata, and pons are new from the prior exam. The right vertebral artery is quite tortuous, indenting and causing leftward displacement of the medulla (series 10, image 4), without definite parenchymal signal abnormality, similar to the prior exam. Vascular: Patent arterial flow voids.  Skull and upper cervical spine: Normal marrow signal. Sinuses/Orbits: No acute finding. Other: Fluid in left mastoid air cells. IMPRESSION: Limited evaluation as the study had to be terminated early. Within this limitation, no acute intracranial process. No acute or subacute infarct. Electronically Signed   By: Merilyn Baba M.D.   On: 10/07/2021 22:14      LOS: 0 days    Cordelia Poche, MD [facility] 10/08/2021, 2:27 PM   If 7PM-7AM, please contact night-coverage [email or web address]

## 2021-10-08 NOTE — Evaluation (Signed)
Physical Therapy Evaluation Patient Details Name: [name] MRN: [id number] DOB: 06/21/1929 Today's Date: 10/08/2021  History of Present Illness  [name] is a 88 y.o. female presented to ED with AMS. She had a fall on 5/25 and was seen in ED and had previous falls before this.  Neighbors noticed her talking off the wall 5/26 and confusion has continued into5/27; with medical history significant of CAD, diastolic CHF, hx of CVA, hx of DVT after venous ablation and hx of PE in 7/22, HTN, HLD, polycythemia vera, hx of breast cancer who  Clinical Impression   Pt admitted with above diagnosis. Lives at home in an independent living apartment, still driving at baseline (confirmed with RN/MD); Prior to admission, pt was able to overall manage independently, however with recent falls and back pain leading up to this admission; Presents to PT with decr activity tolerance, AMS, incr fall risk, back pain with sit<>stand transitions;  Needs mod asisst for bed mobility, min/mod assist for sit<>stand transitions, able to ambulate in the hallway with RW with min/minguard assist; Pt currently with functional limitations due to the deficits listed below (see PT Problem List). Pt will benefit from skilled PT to increase their independence and safety with mobility to allow discharge to the venue listed below.       Given her sharp decline in functional status that prompted this admission, pt will benefit from SNF stay for post-acute rehab to maximize independence and safety with mobility and ADLs; It is also time for pt and family to consider the possibility of ALF for a more sustainable longer term living situation.     Recommendations for follow up therapy are one component of a multi-disciplinary discharge planning process, led by the attending physician.  Recommendations may be updated based on patient status, additional functional criteria and insurance authorization.  Follow Up Recommendations  Skilled nursing-short term rehab (<3 hours/day)    Assistance Recommended at Discharge Frequent or constant Supervision/Assistance  Patient can return home with the following  A lot of help with bathing/dressing/bathroom;A little help with walking and/or transfers;Direct supervision/assist for medications management;Help with stairs or ramp for entrance;Assistance with cooking/housework    Equipment Recommendations Other (comment) (will consider shower seat and/or 3in1, depending on her apt setup)  Recommendations for Other Services  OT consult (will order per protocol)    Functional Status Assessment Patient has had a recent decline in their functional status and demonstrates the ability to make significant improvements in function in a reasonable and predictable amount of time.     Precautions / Restrictions Precautions Precautions: Fall      Mobility  Bed Mobility Overal bed mobility: Needs Assistance Bed Mobility: Sit to Supine       Sit to supine: Mod assist   General bed mobility comments: Mod assist to help LEs back into bed; initiated education re: log rolling for comfort in and out of bed with compression fxs, however pt impulsively lying down    Transfers Overall transfer level: Needs assistance Equipment used: Rolling walker (2 wheels) Transfers: Sit to/from Stand Sit to Stand: Min assist           General transfer comment: Noted decreased control of descent from stand to sit, and pt grimaced in pain upon "landing" onto bed    Ambulation/Gait Ambulation/Gait assistance: Min guard (with and without physical contact ) Gait Distance (Feet): 45 Feet Assistive device: Rolling walker (2 wheels) Gait Pattern/deviations: Step-through pattern, Decreased step length - right, Decreased step  length - left Gait velocity: slow     General Gait Details: noted decr step height, small steps, pt quite distractable during walk; showing some good self-monitor for activity  tolerance, and asking to get some rest back in bed after walk; Adjusted RW height for better fit  Stairs            Wheelchair Mobility    Modified Rankin (Stroke Patients Only)       Balance Overall balance assessment: History of Falls                                           Pertinent Vitals/Pain Pain Assessment Pain Assessment: Faces Faces Pain Scale: Hurts even more Pain Location: low back; in particular, notable grimace when pt is unable to conrol her descent from stand to sit Pain Descriptors / Indicators: Grimacing Pain Intervention(s): Monitored during session, Repositioned    Home Living Family/patient expects to be discharged to:: Private residence Living Arrangements: Alone Available Help at Discharge: Other (Comment) (pt not very forthcoming with infromation; per chart review, pt is from an [place] apartment) Type of Home: Apartment Home Access: Other (comment) (Pt did not give much information)       Home Layout: One level Home Equipment: Conservation officer, nature (2 wheels) Additional Comments: Per chart review, from [place]; at time of PT eval pt pleasant and enjoying walking - just not giving much information; for example, she is still driving, and when asked how she gets her gorceries, she answers she drives to the grocery store -- but she was unable to identify which grocery store she typically goes to    Prior Function Prior Level of Function : Independent/Modified Independent;Driving;History of Falls (last six months);Patient poor historian/Family not available             Mobility Comments: Walks with RW; still drives; when we discussed her history of falls, pt agreed that her walking and balance need work ADLs Comments: Did not report difficulty with ADLs     Hand Dominance   Dominant Hand: Right    Extremity/Trunk Assessment   Upper Extremity Assessment Upper Extremity Assessment: Generalized  weakness    Lower Extremity Assessment Lower Extremity Assessment: Generalized weakness    Cervical / Trunk Assessment Cervical / Trunk Assessment: Kyphotic;Other exceptions Cervical / Trunk Exceptions: Significant grimace in pain when going to sit too fast  Communication   Communication: HOH  Cognition Arousal/Alertness: Awake/alert Behavior During Therapy: WFL for tasks assessed/performed Overall Cognitive Status: Impaired/Different from baseline Area of Impairment: Orientation, Awareness                 Orientation Level: Time, Situation         Awareness: Intellectual            General Comments General comments (skin integrity, edema, etc.): Better able to understand talking with mask off    Exercises     Assessment/Plan    PT Assessment Patient needs continued PT services  PT Problem List Decreased strength;Decreased range of motion;Decreased activity tolerance;Decreased balance;Decreased mobility;Decreased coordination;Decreased cognition;Decreased knowledge of use of DME;Decreased safety awareness;Decreased knowledge of precautions;Pain       PT Treatment Interventions DME instruction;Gait training;Stair training;Therapeutic activities;Functional mobility training;Therapeutic exercise;Neuromuscular re-education;Balance training;Cognitive remediation;Patient/family education    PT Goals (Current goals can be found in the Care Plan section)  Acute Rehab PT Goals Patient  Stated Goal: Does not want to fall again PT Goal Formulation: With patient Time For Goal Achievement: 10/22/21 Potential to Achieve Goals: Good    Frequency Min 3X/week     Co-evaluation               AM-PAC PT "6 Clicks" Mobility  Outcome Measure Help needed turning from your back to your side while in a flat bed without using bedrails?: A Little Help needed moving from lying on your back to sitting on the side of a flat bed without using bedrails?: A Lot Help needed  moving to and from a bed to a chair (including a wheelchair)?: A Little Help needed standing up from a chair using your arms (e.g., wheelchair or bedside chair)?: A Lot Help needed to walk in hospital room?: A Little Help needed climbing 3-5 steps with a railing? : A Lot 6 Click Score: 15    End of Session Equipment Utilized During Treatment: Gait belt Activity Tolerance: Patient tolerated treatment well Patient left: in bed;with call bell/phone within reach;with bed alarm set Nurse Communication: Mobility status;Other (comment) (Discussed PLOF information) PT Visit Diagnosis: Unsteadiness on feet (R26.81);Other abnormalities of gait and mobility (R26.89);Pain;History of falling (Z91.81);Muscle weakness (generalized) (M62.81);Repeated falls (R29.6) Pain - part of body:  (Back; numerous comression fxs (subacute and acute)    Time: 1005-1030 PT Time Calculation (min) (ACUTE ONLY): 25 min   Charges:   PT Evaluation $PT Eval Moderate Complexity: 1 Mod PT Treatments $Gait Training: 8-22 mins        Roney Marion, PT  Acute Rehabilitation Services Office [place] 10/08/2021, 11:15 AM

## 2021-10-08 NOTE — Care Management Obs Status (Signed)
[facility] NOTIFICATION   Patient Details  Name: [name] MRN: [id number] Date of Birth: 1930/05/10   Medicare Observation Status Notification Given:  Yes    Bartholomew Crews, RN 10/08/2021, 4:01 PM

## 2021-10-09 DIAGNOSIS — G9340 Encephalopathy, unspecified: Secondary | ICD-10-CM | POA: Diagnosis not present

## 2021-10-09 DIAGNOSIS — G9341 Metabolic encephalopathy: Secondary | ICD-10-CM | POA: Diagnosis not present

## 2021-10-09 DIAGNOSIS — E43 Unspecified severe protein-calorie malnutrition: Secondary | ICD-10-CM | POA: Insufficient documentation

## 2021-10-09 LAB — BASIC METABOLIC PANEL
Anion gap: 4 — ABNORMAL LOW (ref 5–15)
BUN: 10 mg/dL (ref 8–23)
CO2: 31 mmol/L (ref 22–32)
Calcium: 8.6 mg/dL — ABNORMAL LOW (ref 8.9–10.3)
Chloride: 96 mmol/L — ABNORMAL LOW (ref 98–111)
Creatinine, Ser: 0.7 mg/dL (ref 0.44–1.00)
GFR, Estimated: >60 mL/min (ref >60)
Glucose, Bld: 106 mg/dL — ABNORMAL HIGH (ref 70–99)
Potassium: 3.7 mmol/L (ref 3.5–5.1)
Sodium: 131 mmol/L — ABNORMAL LOW (ref 135–145)

## 2021-10-09 MED ORDER — ACETAMINOPHEN 500 MG PO TABS: ORAL_TABLET | ORAL | 0 refills | Status: AC

## 2021-10-09 MED ORDER — LORAZEPAM 0.5 MG PO TABS: 0.5000 mg | ORAL_TABLET | Freq: Two times a day (BID) | ORAL | 0 refills | Status: DC | PRN

## 2021-10-09 MED ORDER — ENSURE ENLIVE PO LIQD
237.0000 mL | Freq: Two times a day (BID) | ORAL | Status: DC
  Administered 2021-10-09 – 2021-10-10 (×2): 237 mL via ORAL

## 2021-10-09 MED ORDER — TRAMADOL HCL 50 MG PO TABS: 50.0000 mg | ORAL_TABLET | Freq: Two times a day (BID) | ORAL | 0 refills | Status: DC | PRN

## 2021-10-09 MED ORDER — MELATONIN 3 MG PO TABS
3.0000 mg | ORAL_TABLET | Freq: Once | ORAL | Status: AC
  Administered 2021-10-09: 3 mg via ORAL
  Filled 2021-10-09: qty 1

## 2021-10-09 NOTE — TOC Transition Note (Signed)
Transition of Care [facility]) - CM/SW Discharge Note   Patient Details  Name: [name] MRN: [id number] Date of Birth: [birth year]  Transition of Care [facility]) CM/SW Contact:  Milinda Antis, [place] Phone Number: 10/09/2021, 4:22 PM   Clinical Narrative:    Patient will DC to: [facility] Anticipated DC date: 10/09/2021 Family notified: Yes Transport by: Corey Harold   Per MD patient ready for DC to SNF. RN to call report prior to discharge [phone] room 3250. RN, patient, patient's family, and facility notified of DC. Discharge Summary and FL2 sent to facility. DC packet on chart. Ambulance transport requested for patient.   CSW will sign off for now as social work intervention is no longer needed. Please consult [place] again if new needs arise.       Barriers to Discharge: Continued Medical Work up   Patient Goals and CMS Choice Patient states their goals for this hospitalization and ongoing recovery are:: SNF rehab CMS Medicare.gov Compare Post Acute Care list provided to:: Patient Represenative (must comment) [name]) Choice offered to / list presented to : Adult Children  Discharge Placement              Patient chooses bed at:  ([facility]) Patient to be transferred to facility by: [place] Name of family member notified: [name] (Daughter)   [phone] Patient and family notified of of transfer: 10/09/21  Discharge Plan and Services In-house Referral: Clinical Social Work Discharge Planning Services: [employer] Consult Post Acute Care Choice: [place]          DME Arranged: N/A DME Agency: NA       HH Arranged: NA HH Agency: NA        Social Determinants of Health (SDOH) Interventions     Readmission Risk Interventions    09/14/2019   11:11 AM  Readmission Risk Prevention Plan  Medication Screening Complete  Transportation Screening Complete

## 2021-10-09 NOTE — Discharge Summary (Signed)
Physician Discharge Summary   Patient: [name] MRN: [id number] DOB: [birth year]  Admit date:     10/07/2021  Discharge date: 10/10/21  Discharge Physician: Cordelia Poche, MD   PCP: Colon Branch, MD   Recommendations at discharge:  Pain managment  Discharge Diagnoses: Principal Problem:   Acute encephalopathy Active Problems:   Compression fracture of T7 vertebra    Polycythemia   Mild CAD   Essential hypertension   Chronic diastolic CHF (congestive heart failure) (HCC)   PE (pulmonary thromboembolism) (HCC)   GERD (gastroesophageal reflux disease)   ANXIETY DEPRESSION   Protein-calorie malnutrition, severe  Resolved Problems:   * No resolved hospital problems. *  Hospital Course: [name] is a 88 y.o. female with a history of CAD, diastolic heart failure, CVA, DVT who presented for worsening confusion. No obvious etiology except possible side effect of recent narcotics prescribed for back pain which were discontinued. Appears to be at baseline.  Assessment and Plan:  Acute toxic/metabolic encephalopathy No obvious etiology, except likely polypharmacy. CT head and MRI brain without acute process. Urinalysis unremarkable. Normal WBC. Mildly elevated ammonia. Confusion appears to be improving/improved. Scheduled narcotics discontinued.   T7 Compression fracture Recently diagnosed and started on analgesic therapy. Neurosurgery consulted and did not think this was an acute fracture. History of T1, T4, T9, T9, T10 and T12 compression fractures. Patient has a history of falls. Patient started on Tramadol on admission. Continue Tramadol PRN and Tylenol 1000 mg TID on discharge.   Polycythemia Chronic issue. Hemoglobin/hematocrit within normal range now. Patient follows with heme/onc as an outpatient.   CAD Patient is on aspirin as an outpatient. Continue aspirin.   Primary hypertension Continue home metoprolol. Uncontrolled. May need further titration as an  outpatient. Asymptomatic.   Chronic diastolic heart failure Stable.   History of pulmonary embolism Noted. Patient is not on anticoagulation in setting of frequent falls   GERD Continue Protonix (substituted for home Prilosec)   Anxiety Depression Continue home lorazepam PRN   Consultants: None Procedures performed: None  Disposition: Home Diet recommendation: Regular diet  DISCHARGE MEDICATION: Allergies as of 10/09/2021       Reactions   Penicillins Hives, Rash, Other (See Comments)   Has patient had a PCN reaction causing immediate rash, facial/tongue/throat swelling, SOB or lightheadedness with hypotension: Yes Has patient had a PCN reaction causing severe rash involving mucus membranes or skin necrosis: yes Has patient had a PCN reaction that required hospitalization: No Has patient had a PCN reaction occurring within the last 10 years: no If all of the above answers are "NO", then may proceed with Cephalosporin use. Other Reaction: OTHER REACTION   Valsartan Itching, Other (See Comments)   Atorvastatin Diarrhea   Carvedilol Other (See Comments)   "teeth hurt when I took it"   Ezetimibe Nausea Only   Fluvastatin Sodium Other (See Comments)   "Can't remember"   Magnesium Hydroxide Other (See Comments)   REACTION: triggers HAs   Meloxicam Other (See Comments)   Pt seeing auro's / spots.     Pneumovax [pneumococcal Polysaccharide Vaccine] Other (See Comments)   Local reaction   Quinapril Hcl Other (See Comments)    "feelings of tiredness"   Simvastatin Diarrhea   Topamax [topiramate] Itching   Vit D-vit E-safflower Oil Other (See Comments)   Headaches        Medication List     STOP taking these medications    oxyCODONE 5 MG immediate release tablet Commonly known  as: Roxicodone       TAKE these medications    acetaminophen 500 MG tablet Commonly known as: TYLENOL Take 2 tablets (1,000 mg total) by mouth 3 (three) times daily for 5 days, THEN 1  tablet (500 mg total) every 6 (six) hours as needed for mild pain. Start taking on: Oct 09, 2021 What changed: See the new instructions.   alendronate 70 MG tablet Commonly known as: FOSAMAX TAKE 1 TABLET(70 MG) BY MOUTH EVERY 7 DAYS WITH A FULL GLASS OF WATER AND ON AN EMPTY STOMACH What changed: See the new instructions.   aspirin EC 81 MG tablet Take 81 mg by mouth daily. Swallow whole.   furosemide 20 MG tablet Commonly known as: LASIX Take 1 tablet (20 mg total) by mouth every other day.   lactose free nutrition Liqd Take 237 mLs by mouth every other day.   LORazepam 0.5 MG tablet Commonly known as: ATIVAN Take 1 tablet (0.5 mg total) by mouth 2 (two) times daily as needed for anxiety. What changed: See the new instructions.   metoprolol tartrate 50 MG tablet Commonly known as: LOPRESSOR TAKE 1 TABLET(50 MG) BY MOUTH TWICE DAILY What changed: See the new instructions.   nitroGLYCERIN 0.4 MG SL tablet Commonly known as: NITROSTAT Place 1 tablet (0.4 mg total) under the tongue every 5 (five) minutes x 3 doses as needed for chest pain.   omeprazole 20 MG capsule Commonly known as: PRILOSEC Take 1 capsule (20 mg total) by mouth daily. What changed:  when to take this reasons to take this   SINUS MEDICINE PO Take 1 tablet by mouth every 6 (six) hours as needed (sinus congestion).   traMADol 50 MG tablet Commonly known as: ULTRAM Take 1 tablet (50 mg total) by mouth every 12 (twelve) hours as needed for severe pain.   valACYclovir 500 MG tablet Commonly known as: VALTREX TAKE 2 TABLETS BY MOUTH AT ONSET OF RASH AND 2 TABLETS 12 HOURS LATER AS NEEDED What changed:  how much to take how to take this when to take this additional instructions        Discharge Exam: BP (!) 185/83 (BP Location: Left Arm)   Pulse 65   Temp 98.2 F (36.8 C) (Oral)   Resp 19   Wt 58 kg   SpO2 98%   BMI 20.64 kg/m   General exam: Appears calm and comfortable Respiratory  system: Clear to auscultation. Respiratory effort normal. Cardiovascular system: S1 & S2 heard, RRR. Gastrointestinal system: Abdomen is nondistended, soft and nontender. No organomegaly or masses felt. Normal bowel sounds heard. Central nervous system: Alert and oriented to person and place. Musculoskeletal: No edema. No calf tenderness  Condition at discharge: stable  The results of significant diagnostics from this hospitalization (including imaging, microbiology, ancillary and laboratory) are listed below for reference.   Imaging Studies: X-ray chest PA and lateral  Result Date: 10/07/2021 CLINICAL DATA:  Altered mental status. EXAM: CHEST - 2 VIEW COMPARISON:  10/01/2021. FINDINGS: Mild enlargement of the cardiopericardial silhouette. Prominent thoracic aorta. Moderate-sized hiatal hernia. No mediastinal or hilar masses. Linear lung base opacities consistent with atelectasis and/or scarring. Remainder of the lungs is clear. Surgical vascular clips on the left consistent with prior left breast surgery, stable. Multiple vertebral compression fractures treated with vertebroplasty. Skeletal structures are diffusely demineralized. No convincing acute skeletal abnormality. IMPRESSION: No acute cardiopulmonary disease. Electronically Signed   By: Lajean Manes M.D.   On: 10/07/2021 18:35   CT Head  Wo Contrast  Result Date: 10/07/2021 CLINICAL DATA:  Altered level of consciousness EXAM: CT HEAD WITHOUT CONTRAST TECHNIQUE: Contiguous axial images were obtained from the base of the skull through the vertex without intravenous contrast. RADIATION DOSE REDUCTION: This exam was performed according to the departmental dose-optimization program which includes automated exposure control, adjustment of the mA and/or kV according to patient size and/or use of iterative reconstruction technique. COMPARISON:  10/05/2021 FINDINGS: Brain: Chronic ischemic changes are again seen throughout the periventricular white  matter, inferior right frontal cortex, and right thalamus. Hypodensity within the left thalamus compatible with age indeterminate infarct unchanged since prior exam. No new areas of infarction or hemorrhage. Lateral ventricles and remaining midline structures are unremarkable. No acute extra-axial fluid collections. No mass effect. Vascular: Stable atherosclerosis.  No hyperdense vessel. Skull: Normal. Negative for fracture or focal lesion. Sinuses/Orbits: No acute finding. Other: None. IMPRESSION: 1. Stable age-indeterminate left thalamic lacunar infarct. 2. Chronic ischemic changes throughout the periventricular white matter, inferior right frontal cortex, and right thalamus. 3. No acute hemorrhage. Electronically Signed   By: Randa Ngo M.D.   On: 10/07/2021 15:07   CT Head Wo Contrast  Result Date: 10/05/2021 CLINICAL DATA:  Head trauma, minor (Age >= 65y).  Fall. EXAM: CT HEAD WITHOUT CONTRAST TECHNIQUE: Contiguous axial images were obtained from the base of the skull through the vertex without intravenous contrast. RADIATION DOSE REDUCTION: This exam was performed according to the departmental dose-optimization program which includes automated exposure control, adjustment of the mA and/or kV according to patient size and/or use of iterative reconstruction technique. COMPARISON:  05/02/2021 FINDINGS: Brain: Normal anatomic configuration. Parenchymal volume loss is commensurate with the patient's age. Moderate to severe subcortical and periventricular white matter changes are present likely reflecting the sequela of small vessel ischemia. Remote infarcts are seen within the right thalamus, right frontal cortex and the left cerebral hemisphere. Age-indeterminate infarct is seen within the left thalamus, new since prior examination. No abnormal intra or extra-axial mass lesion or fluid collection. No abnormal mass effect or midline shift. No evidence of acute intracranial hemorrhage. Ventricular size is  normal. Cerebellum unremarkable. Vascular: No asymmetric hyperdense vasculature at the skull base. Skull: Intact Sinuses/Orbits: Paranasal sinuses are clear. Orbits are unremarkable. Other: Mastoid air cells and middle ear cavities are clear. IMPRESSION: No acute intracranial injury.  No calvarial fracture. Age-indeterminate left thalamic infarct. Correlation with clinical examination is recommended. If indicated, this could be better assessed with MRI examination. Multiple remote infarcts, stable since prior examination. Advanced senescent change. Electronically Signed   By: Fidela Salisbury M.D.   On: 10/05/2021 02:11   MR BRAIN WO CONTRAST  Result Date: 10/07/2021 CLINICAL DATA:  Confusion EXAM: MRI HEAD WITHOUT CONTRAST TECHNIQUE: Multiplanar, multiecho pulse sequences of the brain and surrounding structures were obtained without intravenous contrast. COMPARISON:  07/19/2018 MRI, correlation is also made with CT head 10/07/2021 FINDINGS: Evaluation is limited as the patient was confused and had difficulty remaining still, as well as trying to remove the coil, causing early termination of the exam. Only diffusion-weighted sequences, sagittal T1, and axial T2 were obtained. Brain: No restricted diffusion to suggest acute or subacute infarct. No acute hemorrhage, mass, mass effect, or midline shift. Confluent T2 hyperintense signal in the periventricular white matter, likely the sequela of severe chronic small vessel ischemic disease. Redemonstrated moderate size remote right thalamic infarct. Lacunar infarcts in the left cerebellum, bilateral corona radiata, and pons are new from the prior exam. The right vertebral artery is  quite tortuous, indenting and causing leftward displacement of the medulla (series 10, image 4), without definite parenchymal signal abnormality, similar to the prior exam. Vascular: Patent arterial flow voids. Skull and upper cervical spine: Normal marrow signal. Sinuses/Orbits: No acute  finding. Other: Fluid in left mastoid air cells. IMPRESSION: Limited evaluation as the study had to be terminated early. Within this limitation, no acute intracranial process. No acute or subacute infarct. Electronically Signed   By: Merilyn Baba M.D.   On: 10/07/2021 22:14   CT T-SPINE NO CHARGE  Result Date: 10/01/2021 CLINICAL DATA:  88 year old female with chest and back pain. EXAM: CT THORACIC SPINE WITH CONTRAST TECHNIQUE: Multiplanar CT images of the thoracic spine were reconstructed from contemporary CT of the Chest. RADIATION DOSE REDUCTION: This exam was performed according to the departmental dose-optimization program which includes automated exposure control, adjustment of the mA and/or kV according to patient size and/or use of iterative reconstruction technique. CONTRAST:  No additional COMPARISON:  CT Chest, Abdomen, and Pelvis today are reported separately. Cervical spine CT 02/19/2020. Prior thoracic spine CT 07/04/2020.  And chest CTA 12/25/2020. FINDINGS: Limited cervical spine imaging: Small C7 cervical ribs confirmed in 2021. Thoracic spine segmentation: Mildly transitional, absent ribs at T12. Correlation with radiographs is recommended prior to any operative intervention. Alignment: Increased exaggeration of thoracic kyphosis since last year. Vertebrae: Diffuse osteopenia. Stable mild chronic T1 superior endplate compression. Previously augmented thoracic compression fractures at T4 and T9. Interval T8, T10, and T12 compression fractures and augmentation. T2 and T3 appears stable and intact. T5 and T6 appears stable and intact. Mild T7 inferior endplate compression with 20% loss of vertebral body height compared to last year. No retropulsion. T7 pedicles and posterior elements appear intact. T11 inferior endplate sclerosis is new from last February, but stable since August and T11 vertebral body height is maintained. T11 pedicles and posterior elements appear intact. Paraspinal and other  soft tissues: Chest and abdominal viscera are detailed separately. Thoracic paraspinal soft tissues remain normal. Disc levels: Above T12 the thoracic spinal canal patency appears stable from last year. Augmentation of the T8 vertebral body does not appear to of exacerbated retropulsion at that level. But the interval T12 compression fracture demonstrates retropulsion of the posterosuperior endplate, although mild if any thoracic spinal stenosis results at that level. IMPRESSION: 1. Mildly transitional anatomy. Small C7 cervical ribs and absent ribs at T12. 2. Diffuse osteopenia. Mild T7 inferior endplate compression fracture is new since 12/25/2020. 20% loss of height. No retropulsion or spinal stenosis. 3. Chronic T1, T4, T8, T9, T10, and T12 compression fractures - most previously augmented. 4.  CT Chest, Abdomen, and Pelvis today are reported separately. Electronically Signed   By: Genevie Ann M.D.   On: 10/01/2021 04:22   CT L-SPINE NO CHARGE  Result Date: 10/01/2021 CLINICAL DATA:  88 year old female with chest and back pain. EXAM: CT LUMBAR SPINE WITH CONTRAST TECHNIQUE: Technique: Multiplanar CT images of the lumbar spine were reconstructed from contemporary CT of the Abdomen and Pelvis. RADIATION DOSE REDUCTION: This exam was performed according to the departmental dose-optimization program which includes automated exposure control, adjustment of the mA and/or kV according to patient size and/or use of iterative reconstruction technique. CONTRAST:  No additional COMPARISON:  CT thoracic spine, chest, Abdomen, and Pelvis today reported separately. CT Lumbar spine 10/03/2020. FINDINGS: Segmentation: Transitional anatomy. Absent ribs at T12. Fully sacralized L5 level. This is concordant with prior cervical and today's thoracic spine numbering. Correlation with radiographs is recommended  prior to any operative intervention. Alignment: Stable mild straightening of lumbar lordosis. Vertebrae: Diffuse severe  osteopenia. Augmented T12 compression fracture is new from the lumbar CT last year. Stable augmented L1 compression fracture since that time. Mild to moderate L2 compression fracture appears stable. Mild L3 compression fracture appears stable. L4 vertebral body height is stable with subtle deformity of the inferior endplate. Posterior elements of those levels appears stable. Completely sacralized L5 level. Grossly stable sacrum. No acute osseous abnormality identified. Paraspinal and other soft tissues: Abdominal and pelvic viscera are detailed separately. Lumbar paraspinal soft tissues remain within normal limits. Disc levels: Lumbar spine degeneration and patency of the lumbar spinal canal appears stable from last year. Mild multifactorial spinal stenosis at L1-L2 in part related to endplate retropulsion. IMPRESSION: 1. Transitional anatomy with absent ribs at T12 and fully sacralized L5 level. Correlation with radiographs is recommended prior to any operative intervention. 2. No acute osseous abnormality. Augmented T12 compression fracture is new from last year, with otherwise stable L1 through L4 compression fractures. 3. Stable lumbar spine degeneration. 4. CTA chest, Abdomen, and Pelvis today are reported separately. Electronically Signed   By: Genevie Ann M.D.   On: 10/01/2021 04:27   DG Chest Port 1 View  Result Date: 10/01/2021 CLINICAL DATA:  Chest pain and nausea. EXAM: PORTABLE CHEST 1 VIEW COMPARISON:  Chest radiograph dated 01/24/2021. FINDINGS: No focal consolidation, pleural effusion or pneumothorax. Stable cardiomegaly. Atherosclerotic calcification of the aorta. Osteopenia with multilevel thoracic vertebroplasty. No acute osseous pathology. IMPRESSION: No acute cardiopulmonary process. Electronically Signed   By: Anner Crete M.D.   On: 10/01/2021 01:08   CT Angio Chest/Abd/Pel for Dissection W and/or Wo Contrast  Result Date: 10/01/2021 CLINICAL DATA:  88 year old female with chest and  back pain. EXAM: CT ANGIOGRAPHY CHEST, ABDOMEN AND PELVIS TECHNIQUE: Non-contrast CT of the chest was initially obtained. Multidetector CT imaging through the chest, abdomen and pelvis was performed using the standard protocol during bolus administration of intravenous contrast. Multiplanar reconstructed images and MIPs were obtained and reviewed to evaluate the vascular anatomy. RADIATION DOSE REDUCTION: This exam was performed according to the departmental dose-optimization program which includes automated exposure control, adjustment of the mA and/or kV according to patient size and/or use of iterative reconstruction technique. CONTRAST:  118m OMNIPAQUE IOHEXOL 350 MG/ML SOLN COMPARISON:  CTA chest, CT Abdomen and Pelvis 12/25/2020. CT thoracic and lumbar spine today are reported separately. FINDINGS: CTA CHEST FINDINGS Cardiovascular: Stable cardiac size since last year, upper limits of normal. Calcified aortic atherosclerosis. Calcified coronary artery atherosclerosis. Tortuous thoracic aorta with no dissection or aneurysm. Major pulmonary arteries today are patent. Resolved right lower lobe posterior basal segment pulmonary embolus demonstrated last year. Mediastinum/Nodes: Stable. Post granulomatous calcified mediastinal and right hilar lymph nodes. Large chronic gastric hiatal hernia. Lungs/Pleura: Mildly larger, improved lung volumes compared to last year. Major airways remain patent. Chronic left lower lobe atelectasis in association with large hiatal hernia. Linear right lower lobe atelectasis or scarring with improved right costophrenic angle ventilation from last year. Trace lung base pleural fluid. No areas of worsening ventilation. Musculoskeletal: Thoracic spine is detailed separately today. Diffuse osteopenia. Sternum appears stable from last year. Visible shoulder osseous structures appear stable. Small C7 cervical ribs, confirmed on prior cervical spine CT 02/19/2020. Absent ribs at T12. Chronic  posterior left 9th rib fracture is stable from last year, but additional lateral left 9th and posterolateral left 8th rib fractures are new although healing. No definite acute rib fracture. Stable  left breast surgical clips. Review of the MIP images confirms the above findings. CTA ABDOMEN AND PELVIS FINDINGS VASCULAR Aortoiliac calcified atherosclerosis. Chronically tortuous abdominal aorta (series 11, image 56), negative for aortic dissection or aneurysm. Celiac, SMA, and IMA remain patent. Renal arteries remain patent. Iliac and visible proximal femoral arteries remain patent. Review of the MIP images confirms the above findings. NON-VASCULAR Hepatobiliary: Chronic hepatomegaly, 20 cm liver and the right lobe extends into the right hemipelvis as before. Chronically absent gallbladder. Bile ducts appear stable. Pancreas: Within normal limits. Spleen: Within normal limits. Incidental small splenule (normal variant). Adrenals/Urinary Tract: Within normal limits. Stomach/Bowel: No dilated large or small bowel. Mostly intrathoracic stomach as before. Decompressed duodenum. Large bowel diverticulosis most pronounced in the pelvis. Retained stool most pronounced in the right colon. No discrete bowel inflammation identified. No free air or free fluid. Lymphatic: No lymphadenopathy identified. Reproductive: Stable, small calcified uterine fibroid. Other: No pelvic free fluid. Musculoskeletal: Lumbar spine reported separately. Diffuse osteopenia. Stable sacral and pelvic bone mineralization. Chronic proximal left femur ORIF. Chronic bilateral pubic rami fractures. No acute osseous abnormality identified. Review of the MIP images confirms the above findings. IMPRESSION: 1. Negative for aortic dissection or aneurysm. Aortic Atherosclerosis (ICD10-I70.0). Resolved right lower lobe pulmonary embolus demonstrated last year. 2. No acute or inflammatory process identified in the chest, abdomen, or pelvis. Thoracic and lumbar  spine CT today reported separately. 3. Large chronic gastric hiatal hernia. Chronic lung base atelectasis, scarring. Chronic Hepatomegaly. Large bowel diverticulosis. Electronically Signed   By: Genevie Ann M.D.   On: 10/01/2021 04:14    Microbiology: Results for orders placed or performed during the hospital encounter of 09/30/21  Resp Panel by RT-PCR (Flu A&B, Covid) Nasopharyngeal Swab     Status: None   Collection Time: 10/01/21  1:52 AM   Specimen: Nasopharyngeal Swab; Nasopharyngeal(NP) swabs in vial transport medium  Result Value Ref Range Status   SARS Coronavirus 2 by RT PCR NEGATIVE NEGATIVE Final    Comment: (NOTE) SARS-CoV-2 target nucleic acids are NOT DETECTED.  The SARS-CoV-2 RNA is generally detectable in upper respiratory specimens during the acute phase of infection. The lowest concentration of SARS-CoV-2 viral copies this assay can detect is 138 copies/mL. A negative result does not preclude SARS-Cov-2 infection and should not be used as the sole basis for treatment or other patient management decisions. A negative result may occur with  improper specimen collection/handling, submission of specimen other than nasopharyngeal swab, presence of viral mutation(s) within the areas targeted by this assay, and inadequate number of viral copies(<138 copies/mL). A negative result must be combined with clinical observations, patient history, and epidemiological information. The expected result is Negative.  Fact Sheet for Patients:  EntrepreneurPulse.com.au  Fact Sheet for Healthcare Providers:  IncredibleEmployment.be  This test is no t yet approved or cleared by the [place] FDA and  has been authorized for detection and/or diagnosis of SARS-CoV-2 by FDA under an Emergency Use Authorization (EUA). This EUA will remain  in effect (meaning this test can be used) for the duration of the COVID-19 declaration under Section 564(b)(1) of the  Act, 21 U.S.C.section 360bbb-3(b)(1), unless the authorization is terminated  or revoked sooner.       Influenza A by PCR NEGATIVE NEGATIVE Final   Influenza B by PCR NEGATIVE NEGATIVE Final    Comment: (NOTE) The Xpert Xpress SARS-CoV-2/FLU/RSV plus assay is intended as an aid in the diagnosis of influenza from Nasopharyngeal swab specimens and should not be used  as a sole basis for treatment. Nasal washings and aspirates are unacceptable for Xpert Xpress SARS-CoV-2/FLU/RSV testing.  Fact Sheet for Patients: EntrepreneurPulse.com.au  Fact Sheet for Healthcare Providers: IncredibleEmployment.be  This test is not yet approved or cleared by the [place] FDA and has been authorized for detection and/or diagnosis of SARS-CoV-2 by FDA under an Emergency Use Authorization (EUA). This EUA will remain in effect (meaning this test can be used) for the duration of the COVID-19 declaration under Section 564(b)(1) of the Act, 21 U.S.C. section 360bbb-3(b)(1), unless the authorization is terminated or revoked.  Performed at [facility], [place] [address] [place], [place] [address]    *Note: Due to a large number of results and/or encounters for the requested time period, some results have not been displayed. A complete set of results can be found in Results Review.    Labs: CBC: Recent Labs  Lab 10/05/21 0025 10/07/21 1116 10/07/21 1252 10/08/21 0340  WBC 9.4 11.6*  --  6.4  NEUTROABS 7.5  --   --   --   HGB 16.0* 16.4* 16.7* 14.3  HCT 48.2* 51.4* 49.0* 42.3  MCV 95.3 97.7  --  94.6  PLT 484* 496*  --  659   Basic Metabolic Panel: Recent Labs  Lab 10/05/21 0025 10/07/21 1116 10/07/21 1252 10/08/21 0340 10/09/21 0146  NA 132* 134* 132* 133* 131*  K 3.6 4.0 3.5 2.9* 3.7  CL 89* 93*  --  94* 96*  CO2 34* 29  --  30 31  GLUCOSE 121* 123*  --  103* 106*  BUN 7* 18  --  17 10  CREATININE 0.74 1.01*  --  0.73 0.70  CALCIUM  10.9* 10.6*  --  8.7* 8.6*   Liver Function Tests: Recent Labs  Lab 10/05/21 0025 10/07/21 1116  AST 20 21  ALT 12 9  ALKPHOS 47 50  BILITOT 1.2 1.6*  PROT 7.0 6.8  ALBUMIN 3.8 3.9   CBG: Recent Labs  Lab 10/07/21 1408  GLUCAP 108*    Signed: Cordelia Poche, MD [facility] 10/09/2021

## 2021-10-09 NOTE — Progress Notes (Signed)
PT Cancellation Note  Patient Details Name: JERALYNN VAQUERA MRN: [id number] DOB: 17-Sep-1929   Cancelled Treatment:    Reason Eval/Treat Not Completed: Other (comment)  Checked on pt who was sleeping soundly after what looks like quite an eventful night;  Opted to let her sleep;   Will continue to follow,   Roney Marion, [place] 10/09/2021, 10:58 AM

## 2021-10-09 NOTE — Progress Notes (Signed)
PROGRESS NOTE    [name]  FKC:[id number] DOB: Oct 07, 1929 DOA: 10/07/2021 PCP: Colon Branch, MD   Brief Narrative: [name] is a 88 y.o. female with a history of CAD, diastolic heart failure, CVA, DVT who presented for worsening confusion. No obvious etiology except possible side effect of recent narcotics prescribed for back pain. Appears to be at baseline.   Assessment and Plan:  Acute toxic/metabolic encephalopathy No obvious etiology, except likely polypharmacy. CT head and MRI brain without acute process. Urinalysis unremarkable. Normal WBC. Mildly elevated ammonia. Confusion appears to be improving/improved. Scheduled narcotics discontinued.  T7 Compression fracture Recently diagnosed and started on analgesic therapy. Neurosurgery consulted and did not think this was an acute fracture. History of T1, T4, T9, T9, T10 and T12 compression fractures. Patient has a history of falls. Patient started on Tramadol on admission. -Continue Tramadol PRN and Tylenol 1000 mg TID  Polycythemia Chronic issue. Hemoglobin/hematocrit within normal range now. Patient follows with heme/onc as an outpatient.  CAD Patient is on aspirin as an outpatient. -Continue aspirin  Primary hypertension -Contine home metoprolol  Chronic diastolic heart failure Stable.  History of pulmonary embolism Noted. Patient is not on anticoagulation in setting of frequent falls  GERD Continue Protonix (substituted for home Prilosec)  Anxiety Depression -Continue home lorazepam PRN  DVT prophylaxis: SCDs Code Status:   Code Status: Full Code Family Communication: None at bedside Disposition Plan: Discharge to SNF when bed is available. Medically stable for discharge.   Consultants:  Neurosurgery (curbside)  Procedures:  None  Antimicrobials: None    Subjective: Patient reports no issues. Hoping to go home today.  Objective: BP (!) 185/83 (BP Location: Left Arm)   Pulse 65    Temp 98.2 F (36.8 C) (Oral)   Resp 19   Wt 58 kg   SpO2 98%   BMI 20.64 kg/m   Examination:  General exam: Appears calm and comfortable Respiratory system: Clear to auscultation. Respiratory effort normal. Cardiovascular system: S1 & S2 heard, RRR. Gastrointestinal system: Abdomen is nondistended, soft and nontender. No organomegaly or masses felt. Normal bowel sounds heard. Central nervous system: Alert and oriented to person and place. Musculoskeletal: No edema. No calf tenderness   Data Reviewed: I have personally reviewed following labs and imaging studies  CBC Lab Results  Component Value Date   WBC 6.4 10/08/2021   RBC 4.47 10/08/2021   HGB 14.3 10/08/2021   HCT 42.3 10/08/2021   MCV 94.6 10/08/2021   MCH 32.0 10/08/2021   PLT 391 10/08/2021   MCHC 33.8 10/08/2021   RDW 13.1 10/08/2021   LYMPHSABS 0.6 (L) 10/05/2021   MONOABS 0.9 10/05/2021   EOSABS 0.2 10/05/2021   BASOSABS 0.0 74/94/4967     Last metabolic panel Lab Results  Component Value Date   NA 131 (L) 10/09/2021   K 3.7 10/09/2021   CL 96 (L) 10/09/2021   CO2 31 10/09/2021   BUN 10 10/09/2021   CREATININE 0.70 10/09/2021   GLUCOSE 106 (H) 10/09/2021   GFRNONAA >60 10/09/2021   GFRAA NOT CALCULATED 12/27/2019   CALCIUM 8.6 (L) 10/09/2021   PROT 6.8 10/07/2021   ALBUMIN 3.9 10/07/2021   LABGLOB 2.7 10/04/2016   AGRATIO 1.6 10/04/2016   BILITOT 1.6 (H) 10/07/2021   ALKPHOS 50 10/07/2021   AST 21 10/07/2021   ALT 9 10/07/2021   ANIONGAP 4 (L) 10/09/2021    GFR: Estimated Creatinine Clearance: 42.8 mL/min (by C-G formula based on SCr of 0.7  mg/dL).  Recent Results (from the past 240 hour(s))  Resp Panel by RT-PCR (Flu A&B, Covid) Nasopharyngeal Swab     Status: None   Collection Time: 10/01/21  1:52 AM   Specimen: Nasopharyngeal Swab; Nasopharyngeal(NP) swabs in vial transport medium  Result Value Ref Range Status   SARS Coronavirus 2 by RT PCR NEGATIVE NEGATIVE Final    Comment:  (NOTE) SARS-CoV-2 target nucleic acids are NOT DETECTED.  The SARS-CoV-2 RNA is generally detectable in upper respiratory specimens during the acute phase of infection. The lowest concentration of SARS-CoV-2 viral copies this assay can detect is 138 copies/mL. A negative result does not preclude SARS-Cov-2 infection and should not be used as the sole basis for treatment or other patient management decisions. A negative result may occur with  improper specimen collection/handling, submission of specimen other than nasopharyngeal swab, presence of viral mutation(s) within the areas targeted by this assay, and inadequate number of viral copies(<138 copies/mL). A negative result must be combined with clinical observations, patient history, and epidemiological information. The expected result is Negative.  Fact Sheet for Patients:  EntrepreneurPulse.com.au  Fact Sheet for Healthcare Providers:  IncredibleEmployment.be  This test is no t yet approved or cleared by the [place] FDA and  has been authorized for detection and/or diagnosis of SARS-CoV-2 by FDA under an Emergency Use Authorization (EUA). This EUA will remain  in effect (meaning this test can be used) for the duration of the COVID-19 declaration under Section 564(b)(1) of the Act, 21 U.S.C.section 360bbb-3(b)(1), unless the authorization is terminated  or revoked sooner.       Influenza A by PCR NEGATIVE NEGATIVE Final   Influenza B by PCR NEGATIVE NEGATIVE Final    Comment: (NOTE) The Xpert Xpress SARS-CoV-2/FLU/RSV plus assay is intended as an aid in the diagnosis of influenza from Nasopharyngeal swab specimens and should not be used as a sole basis for treatment. Nasal washings and aspirates are unacceptable for Xpert Xpress SARS-CoV-2/FLU/RSV testing.  Fact Sheet for Patients: EntrepreneurPulse.com.au  Fact Sheet for Healthcare  Providers: IncredibleEmployment.be  This test is not yet approved or cleared by the [place] FDA and has been authorized for detection and/or diagnosis of SARS-CoV-2 by FDA under an Emergency Use Authorization (EUA). This EUA will remain in effect (meaning this test can be used) for the duration of the COVID-19 declaration under Section 564(b)(1) of the Act, 21 U.S.C. section 360bbb-3(b)(1), unless the authorization is terminated or revoked.  Performed at [facility], [place] [address], [place], [place] [address]       Radiology Studies: X-ray chest PA and lateral  Result Date: 10/07/2021 CLINICAL DATA:  Altered mental status. EXAM: CHEST - 2 VIEW COMPARISON:  10/01/2021. FINDINGS: Mild enlargement of the cardiopericardial silhouette. Prominent thoracic aorta. Moderate-sized hiatal hernia. No mediastinal or hilar masses. Linear lung base opacities consistent with atelectasis and/or scarring. Remainder of the lungs is clear. Surgical vascular clips on the left consistent with prior left breast surgery, stable. Multiple vertebral compression fractures treated with vertebroplasty. Skeletal structures are diffusely demineralized. No convincing acute skeletal abnormality. IMPRESSION: No acute cardiopulmonary disease. Electronically Signed   By: Lajean Manes M.D.   On: 10/07/2021 18:35   CT Head Wo Contrast  Result Date: 10/07/2021 CLINICAL DATA:  Altered level of consciousness EXAM: CT HEAD WITHOUT CONTRAST TECHNIQUE: Contiguous axial images were obtained from the base of the skull through the vertex without intravenous contrast. RADIATION DOSE REDUCTION: This exam was performed according to the departmental dose-optimization  program which includes automated exposure control, adjustment of the mA and/or kV according to patient size and/or use of iterative reconstruction technique. COMPARISON:  10/05/2021 FINDINGS: Brain: Chronic ischemic changes are again seen  throughout the periventricular white matter, inferior right frontal cortex, and right thalamus. Hypodensity within the left thalamus compatible with age indeterminate infarct unchanged since prior exam. No new areas of infarction or hemorrhage. Lateral ventricles and remaining midline structures are unremarkable. No acute extra-axial fluid collections. No mass effect. Vascular: Stable atherosclerosis.  No hyperdense vessel. Skull: Normal. Negative for fracture or focal lesion. Sinuses/Orbits: No acute finding. Other: None. IMPRESSION: 1. Stable age-indeterminate left thalamic lacunar infarct. 2. Chronic ischemic changes throughout the periventricular white matter, inferior right frontal cortex, and right thalamus. 3. No acute hemorrhage. Electronically Signed   By: Randa Ngo M.D.   On: 10/07/2021 15:07   MR BRAIN WO CONTRAST  Result Date: 10/07/2021 CLINICAL DATA:  Confusion EXAM: MRI HEAD WITHOUT CONTRAST TECHNIQUE: Multiplanar, multiecho pulse sequences of the brain and surrounding structures were obtained without intravenous contrast. COMPARISON:  07/19/2018 MRI, correlation is also made with CT head 10/07/2021 FINDINGS: Evaluation is limited as the patient was confused and had difficulty remaining still, as well as trying to remove the coil, causing early termination of the exam. Only diffusion-weighted sequences, sagittal T1, and axial T2 were obtained. Brain: No restricted diffusion to suggest acute or subacute infarct. No acute hemorrhage, mass, mass effect, or midline shift. Confluent T2 hyperintense signal in the periventricular white matter, likely the sequela of severe chronic small vessel ischemic disease. Redemonstrated moderate size remote right thalamic infarct. Lacunar infarcts in the left cerebellum, bilateral corona radiata, and pons are new from the prior exam. The right vertebral artery is quite tortuous, indenting and causing leftward displacement of the medulla (series 10, image 4),  without definite parenchymal signal abnormality, similar to the prior exam. Vascular: Patent arterial flow voids. Skull and upper cervical spine: Normal marrow signal. Sinuses/Orbits: No acute finding. Other: Fluid in left mastoid air cells. IMPRESSION: Limited evaluation as the study had to be terminated early. Within this limitation, no acute intracranial process. No acute or subacute infarct. Electronically Signed   By: Merilyn Baba M.D.   On: 10/07/2021 22:14      LOS: 0 days    Cordelia Poche, MD [facility] 10/09/2021, 11:44 AM   If 7PM-7AM, please contact night-coverage [email or web address]

## 2021-10-09 NOTE — TOC Progression Note (Signed)
Transition of Care [facility]) - Initial/Assessment Note    Patient Details  Name: [name] MRN: [id number] Date of Birth: 31-May-1929  Transition of Care [facility]) CM/SW Contact:    Milinda Antis, LCSWA Phone Number: 10/09/2021, 4:45 PM  Clinical Narrative:                 16:45-  CSW received a returned call from [place] with [facility] [phone].  The facility is unable to accept the patient today.    CSW will follow up tomorrow.  Expected Discharge Plan: Skilled Nursing Facility Barriers to Discharge: Continued Medical Work up   Patient Goals and CMS Choice Patient states their goals for this hospitalization and ongoing recovery are:: SNF rehab CMS Medicare.gov Compare Post Acute Care list provided to:: Patient Represenative (must comment) [name]) Choice offered to / list presented to : Adult Children  Expected Discharge Plan and Services Expected Discharge Plan: Skilled Nursing Facility In-house Referral: Clinical Social Work Discharge Planning Services: CM Consult Post Acute Care Choice: [place] Living arrangements for the past 2 months: [place] Expected Discharge Date: 10/09/21               DME Arranged: N/A DME Agency: NA       HH Arranged: NA [place] Agency: NA        Prior Living Arrangements/Services Living arrangements for the past 2 months: [place] Lives with:: Facility Resident Patient language and need for interpreter reviewed:: Yes            Current home services: DME (RW, cane) Criminal Activity/Legal Involvement Pertinent to Current Situation/Hospitalization: No - Comment as needed  Activities of Daily Living Home Assistive Devices/Equipment: Cane (specify quad or straight), Eyeglasses, Dentures (specify type), Walker (specify type) ADL Screening (condition at time of admission) Patient's cognitive ability adequate to safely complete daily activities?: No Is the patient deaf or  have difficulty hearing?: Yes Does the patient have difficulty seeing, even when wearing glasses/contacts?: No Does the patient have difficulty concentrating, remembering, or making decisions?: Yes Patient able to express need for assistance with ADLs?: Yes Does the patient have difficulty dressing or bathing?: Yes Independently performs ADLs?: No Does the patient have difficulty walking or climbing stairs?: Yes Weakness of Legs: Both Weakness of Arms/Hands: None  Permission Sought/Granted                  Emotional Assessment       Orientation: : Oriented to Self Alcohol / Substance Use: Not Applicable Psych Involvement: No (comment)  Admission diagnosis:  Acute encephalopathy [G93.40] Altered mental status, unspecified altered mental status type [R41.82] Patient Active Problem List   Diagnosis Date Noted   Protein-calorie malnutrition, severe 10/09/2021   Acute encephalopathy 10/07/2021   Compression fracture of T7 vertebra  10/07/2021   Rib pain on right side 01/24/2021   Skin rash 01/24/2021   Mastalgia 01/24/2021   PE (pulmonary thromboembolism) ([place]) 12/25/2020   Lumbar compression fracture ([place]) 11/16/2020   Generalized weakness 10/03/2020   GERD (gastroesophageal reflux disease)    Dyspnea    DVT (deep venous thrombosis) (HCC)    Diverticulitis    CAD (coronary artery disease)    Acute midline thoracic back pain 03/04/2020   Pressure injury of skin 12/27/2019   S/p left hip fracture    Comminuted fracture of hip, left, open type I or II, initial encounter ([place]) 12/24/2019   Closed left hip fracture ([place]) 12/24/2019   ICH (intracerebral  hemorrhage) ([place]) 11/04/2019   Intractable back pain 09/13/2019   Compression fracture of T9 vertebra (HCC) 09/13/2019   Chronic diastolic CHF (congestive heart failure) ([place]) 09/13/2019   Overweight (BMI 25.0-29.9) 09/13/2019   Constipation 09/13/2019   Raynaud's phenomenon without gangrene 05/19/2018   Iron deficiency  anemia due to chronic blood loss 12/30/2017   Insomnia 06/25/2017   Mild CAD 05/24/2017   PCP NOTES >>>>>>>>> 02/15/2015   Superficial thrombophlebitis 04/06/2014   Annual physical exam 08/18/2012   Breast cancer ([place]) 11/05/2011   Polycythemia 11/05/2011   Stroke ([place]) 03/29/2011   OSA (obstructive sleep apnea) 03/29/2011   OA (osteoarthritis) of knee 03/29/2011   Hiatal hernia 03/29/2011   E. coli gastroenteritis 03/29/2011   Depression with anxiety 03/29/2011   Chest pain 01/16/2011   TIA (transient ischemic attack) 01/16/2011   CARDIOMYOPATHY 08/03/2009   DJD -- pain mgmt 08/16/2008   ABDOMINAL PAIN, LEFT LOWER QUADRANT 07/05/2008   GAIT DISTURBANCE 10/21/2007   ANXIETY DEPRESSION 10/08/2007   Fracture 09/10/2007   Hyperlipidemia 06/25/2007   Takotsubo cardiomyopathy 06/25/2007   GERD without esophagitis 06/25/2007   Essential hypertension 09/30/2006   Senile osteoporosis 09/30/2006   Migraine with aura 09/30/2006   Hyperplastic colon polyp 06/2001   PCP:  Colon Branch, MD Pharmacy:   [facility] [phone] [address], [address] AT [facility] [place] [address] Phone: [phone] Fax: [phone]     Social Determinants of Health (SDOH) Interventions    Readmission Risk Interventions    09/14/2019   11:11 AM  Readmission Risk Prevention Plan  Medication Screening Complete  Transportation Screening Complete

## 2021-10-09 NOTE — Discharge Instructions (Signed)
[name],  You were in the hospital with confusion. This appears to be related to pain medication and you have improved. Your medications have been adjusted.

## 2021-10-09 NOTE — Progress Notes (Addendum)
Initial Nutrition Assessment  DOCUMENTATION CODES:   Severe malnutrition in context of social or environmental circumstances  INTERVENTION:   Ensure Enlive po BID, each supplement provides 350 kcal and 20 grams of protein.  Encourage PO intake    NUTRITION DIAGNOSIS:   Severe Malnutrition related to social / environmental circumstances as evidenced by severe fat depletion, severe muscle depletion.  GOAL:   Patient will meet greater than or equal to 90% of their needs  MONITOR:   PO intake, Supplement acceptance  REASON FOR ASSESSMENT:   Malnutrition Screening Tool    ASSESSMENT:   Pt with PMH of CAD, CHF, CVA, DVT/PE, HTN, HLD, and polycythemia who fell 5/25 now admitted with AMS, metabolic encephalopathy.    Pt on the phone the entire visit. Pt confused. Spoke with sitter. Pt eating about 25% of her meals so far. Pt living at home alone previously. Pt meets criteria for severe malnutrition on admission.  Noted plans for SNF 5/30.  Pt with allergies listed for Vitamin D/E, safflower oil, and magnesium hydroxide as individual nutrients. No food allergies noted.  Pt has had ensure during past admissions.   Meal Completion: 25%  Medications reviewed and include: protonix   Labs reviewed: Na 131 Ammonia: 47    NUTRITION - FOCUSED PHYSICAL EXAM:  Flowsheet Row Most Recent Value  Orbital Region Moderate depletion  Upper Arm Region Severe depletion  Thoracic and Lumbar Region Severe depletion  Buccal Region Moderate depletion  Temple Region Severe depletion  Clavicle Bone Region Severe depletion  Clavicle and Acromion Bone Region Severe depletion  Scapular Bone Region Severe depletion  Dorsal Hand Moderate depletion  Patellar Region Severe depletion  Anterior Thigh Region Severe depletion  Posterior Calf Region Severe depletion  Edema (RD Assessment) None  Hair Reviewed  Eyes Unable to assess  Mouth Unable to assess  [pt on the phone]  Skin Reviewed  Nails  Reviewed       Diet Order:   Diet Order             Diet regular Room service appropriate? Yes; Fluid consistency: Thin  Diet effective now                   EDUCATION NEEDS:   Not appropriate for education at this time  Skin:  Skin Assessment: Reviewed RN Assessment  Last BM:  5/26  Height:   Ht Readings from Last 1 Encounters:  09/30/21 '5\' 6"'$  (1.676 m)    Weight:   Wt Readings from Last 1 Encounters:  10/09/21 58 kg   BMI:  Body mass index is 20.64 kg/m.  Estimated Nutritional Needs:   Kcal:  1500-1700  Protein:  75-85 grams  Fluid:  >1.5 L/day  Lockie Pares., RD, LDN, CNSC See AMiON for contact information

## 2021-10-09 NOTE — TOC Progression Note (Addendum)
Transition of Care [facility]) - Initial/Assessment Note    Patient Details  Name: [name] MRN: [id number] Date of Birth: 29-May-1929  Transition of Care [facility]) CM/SW Contact:    [name], [place] Phone Number: 10/09/2021, 12:22 PM  Clinical Narrative:                 10:15-CSW went to meet with patient at bedside to present bed offers.  Patient was sleeping.  Per sitter, patient had an eventful night and did not get any sleep.    CSW will follow up with patient at a later time.   13:06-  CSW met with the patient at bedside and presented bed offers.  The patient is agreeable to go to [facility]    CSW contacted [place] in admissions at [facility].  The facility can accept the patient tomorrow.  CSW updated the insurance authorization request to reflect the facility above.   CSW contacted the patient's daughter and informed of the information above.  The daughter plans to update the patient later today.  TOC will continue to follow.     13:20- CSW notified that [facility] leadership and MD [name], [place]; and Dr. Wynelle Parrish)  want CSW to contact [facility] to inquire about whether they will accept the patient with a 5 day LOG.  CSW contacted [name] with [facility] and was informed that the facility will not accept a 5 day LOG.  14:00-  CSW asked by [facility] leadership [name]) if [facility], patient's facility of choice, will accept an LOG for 5 days.  CSW informed leadership that the facility is unable to accept due to not having a bed available tomorrow.  14:10-  CSW received a call from [place] at [facility].  [name] is now checking with her administrator to inquire about whether they will accept a 5 day LOG and is awaiting a response.  14:15-  CSW contacted the patient's daughter, [name], to inform her of that [facility] has a bed available today and that the patient can discharge there if the facility is willing to accept LOG.  The patient's daughter  stated that she does not want the patient to go to [facility] and prefers that the patient go to [facility] as they have extended a bed offer and the facility is closer to family.  CSW notified [facility] leadership and MDs [name]; Dr. Wynelle Parrish, and Dr. Lonny Parrish) of the family's preference and response to placement at [facility].  CSW instructed by [facility] leadership Dawn Newport, RN) that due to the patient being under observation status, CSW needs to continue with possible placement at [facility] today if the facility can accept.  14:57-  CSW notified that [facility] will accept the patient.  Patient's family notified.  Patient's family asked about right to appeal.  CSW was informed by [name]) that because the patient is an observation patient there is not an appeal process.  Family notified.    CSW left message in [employer] portal, as the company is closed for the holiday, requesting that the facility be changed to [facility].     Expected Discharge Plan: [place]    Patient Goals and CMS Choice Patient states their goals for this hospitalization and ongoing recovery are:: SNF rehab CMS Medicare.gov Compare Post Acute Care list provided to:: Patient Represenative (must comment) [name]) Choice offered to / list presented to : Adult Children  Expected Discharge Plan and Services Expected Discharge Plan:  Skilled Nursing Facility In-house Referral: Clinical Social Work Discharge Planning Services: CM Consult Post Acute Care Choice: [place] Living arrangements for the past 2 months: [place]                 DME Arranged: N/A DME Agency: NA       HH Arranged: NA [place] Agency: NA        Prior Living Arrangements/Services Living arrangements for the past 2 months: [place] Lives with:: Facility Resident Patient language and need for interpreter reviewed:: Yes            Current  home services: DME (RW, cane) Criminal Activity/Legal Involvement Pertinent to Current Situation/Hospitalization: No - Comment as needed  Activities of Daily Living Home Assistive Devices/Equipment: Cane (specify quad or straight), Eyeglasses, Dentures (specify type), Walker (specify type) ADL Screening (condition at time of admission) Patient's cognitive ability adequate to safely complete daily activities?: No Is the patient deaf or have difficulty hearing?: Yes Does the patient have difficulty seeing, even when wearing glasses/contacts?: No Does the patient have difficulty concentrating, remembering, or making decisions?: Yes Patient able to express need for assistance with ADLs?: Yes Does the patient have difficulty dressing or bathing?: Yes Independently performs ADLs?: No Does the patient have difficulty walking or climbing stairs?: Yes Weakness of Legs: Both Weakness of Arms/Hands: None  Permission Sought/Granted                  Emotional Assessment       Orientation: : Oriented to Self Alcohol / Substance Use: Not Applicable Psych Involvement: No (comment)  Admission diagnosis:  Acute encephalopathy [G93.40] Altered mental status, unspecified altered mental status type [R41.82] Patient Active Problem List   Diagnosis Date Noted   Acute encephalopathy 10/07/2021   Compression fracture of T7 vertebra  10/07/2021   Rib pain on right side 01/24/2021   Skin rash 01/24/2021   Mastalgia 01/24/2021   PE (pulmonary thromboembolism) ([place]) 12/25/2020   Lumbar compression fracture ([place]) 11/16/2020   Generalized weakness 10/03/2020   GERD (gastroesophageal reflux disease)    Dyspnea    DVT (deep venous thrombosis) ([place])    Diverticulitis    CAD (coronary artery disease)    Acute midline thoracic back pain 03/04/2020   Pressure injury of skin 12/27/2019   S/p left hip fracture    Comminuted fracture of hip, left, open type I or II, initial encounter ([place]) 12/24/2019    Closed left hip fracture ([place]) 12/24/2019   ICH (intracerebral hemorrhage) ([place]) 11/04/2019   Intractable back pain 09/13/2019   Compression fracture of T9 vertebra ([place]) 09/13/2019   Chronic diastolic CHF (congestive heart failure) ([place]) 09/13/2019   Overweight (BMI 25.0-29.9) 09/13/2019   Constipation 09/13/2019   Raynaud's phenomenon without gangrene 05/19/2018   Iron deficiency anemia due to chronic blood loss 12/30/2017   Insomnia 06/25/2017   Mild CAD 05/24/2017   PCP NOTES >>>>>>>>> 02/15/2015   Superficial thrombophlebitis 04/06/2014   Annual physical exam 08/18/2012   Breast cancer ([place]) 11/05/2011   Polycythemia 11/05/2011   Stroke ([place]) 03/29/2011   OSA (obstructive sleep apnea) 03/29/2011   OA (osteoarthritis) of knee 03/29/2011   Hiatal hernia 03/29/2011   E. coli gastroenteritis 03/29/2011   Depression with anxiety 03/29/2011   Chest pain 01/16/2011   TIA (transient ischemic attack) 01/16/2011   CARDIOMYOPATHY 08/03/2009   DJD -- pain mgmt 08/16/2008   ABDOMINAL PAIN, LEFT LOWER QUADRANT 07/05/2008   GAIT DISTURBANCE 10/21/2007  ANXIETY DEPRESSION 10/08/2007   Fracture 09/10/2007   Hyperlipidemia 06/25/2007   Takotsubo cardiomyopathy 06/25/2007   GERD without esophagitis 06/25/2007   Essential hypertension 09/30/2006   Senile osteoporosis 09/30/2006   Migraine with aura 09/30/2006   Hyperplastic colon polyp 06/2001   PCP:  Colon Branch, MD Pharmacy:   [facility] DRUG STORE [phone] [address], [address] AT [facility] [address] [place] [address] Phone: [phone] Fax: [phone]     Social Determinants of Health ([place]) Interventions    Readmission Risk Interventions    09/14/2019   11:11 AM  Readmission Risk Prevention Plan  Medication Screening Complete  Transportation Screening Complete

## 2021-10-10 ENCOUNTER — Telehealth: Payer: Self-pay

## 2021-10-10 DIAGNOSIS — I2699 Other pulmonary embolism without acute cor pulmonale: Secondary | ICD-10-CM | POA: Diagnosis not present

## 2021-10-10 DIAGNOSIS — I130 Hypertensive heart and chronic kidney disease with heart failure and stage 1 through stage 4 chronic kidney disease, or unspecified chronic kidney disease: Secondary | ICD-10-CM | POA: Diagnosis not present

## 2021-10-10 DIAGNOSIS — I10 Essential (primary) hypertension: Secondary | ICD-10-CM | POA: Diagnosis not present

## 2021-10-10 DIAGNOSIS — D500 Iron deficiency anemia secondary to blood loss (chronic): Secondary | ICD-10-CM | POA: Diagnosis not present

## 2021-10-10 DIAGNOSIS — D751 Secondary polycythemia: Secondary | ICD-10-CM | POA: Diagnosis not present

## 2021-10-10 DIAGNOSIS — G9340 Encephalopathy, unspecified: Secondary | ICD-10-CM | POA: Diagnosis not present

## 2021-10-10 DIAGNOSIS — I5030 Unspecified diastolic (congestive) heart failure: Secondary | ICD-10-CM | POA: Diagnosis not present

## 2021-10-10 DIAGNOSIS — I428 Other cardiomyopathies: Secondary | ICD-10-CM | POA: Diagnosis not present

## 2021-10-10 DIAGNOSIS — S22061D Stable burst fracture of T7-T8 vertebra, subsequent encounter for fracture with routine healing: Secondary | ICD-10-CM | POA: Diagnosis not present

## 2021-10-10 DIAGNOSIS — I110 Hypertensive heart disease with heart failure: Secondary | ICD-10-CM | POA: Diagnosis not present

## 2021-10-10 DIAGNOSIS — C50919 Malignant neoplasm of unspecified site of unspecified female breast: Secondary | ICD-10-CM | POA: Diagnosis not present

## 2021-10-10 DIAGNOSIS — Z96652 Presence of left artificial knee joint: Secondary | ICD-10-CM | POA: Diagnosis not present

## 2021-10-10 DIAGNOSIS — Z86711 Personal history of pulmonary embolism: Secondary | ICD-10-CM | POA: Diagnosis not present

## 2021-10-10 DIAGNOSIS — K219 Gastro-esophageal reflux disease without esophagitis: Secondary | ICD-10-CM | POA: Diagnosis not present

## 2021-10-10 DIAGNOSIS — E559 Vitamin D deficiency, unspecified: Secondary | ICD-10-CM | POA: Diagnosis not present

## 2021-10-10 DIAGNOSIS — R2689 Other abnormalities of gait and mobility: Secondary | ICD-10-CM | POA: Diagnosis not present

## 2021-10-10 DIAGNOSIS — M549 Dorsalgia, unspecified: Secondary | ICD-10-CM | POA: Diagnosis not present

## 2021-10-10 DIAGNOSIS — Z853 Personal history of malignant neoplasm of breast: Secondary | ICD-10-CM | POA: Diagnosis not present

## 2021-10-10 DIAGNOSIS — I7300 Raynaud's syndrome without gangrene: Secondary | ICD-10-CM | POA: Diagnosis not present

## 2021-10-10 DIAGNOSIS — I2510 Atherosclerotic heart disease of native coronary artery without angina pectoris: Secondary | ICD-10-CM | POA: Diagnosis not present

## 2021-10-10 DIAGNOSIS — S22070D Wedge compression fracture of T9-T10 vertebra, subsequent encounter for fracture with routine healing: Secondary | ICD-10-CM | POA: Diagnosis not present

## 2021-10-10 DIAGNOSIS — I4891 Unspecified atrial fibrillation: Secondary | ICD-10-CM | POA: Diagnosis not present

## 2021-10-10 DIAGNOSIS — F17210 Nicotine dependence, cigarettes, uncomplicated: Secondary | ICD-10-CM | POA: Diagnosis not present

## 2021-10-10 DIAGNOSIS — Z8673 Personal history of transient ischemic attack (TIA), and cerebral infarction without residual deficits: Secondary | ICD-10-CM | POA: Diagnosis not present

## 2021-10-10 DIAGNOSIS — E119 Type 2 diabetes mellitus without complications: Secondary | ICD-10-CM | POA: Diagnosis not present

## 2021-10-10 DIAGNOSIS — Z7401 Bed confinement status: Secondary | ICD-10-CM | POA: Diagnosis not present

## 2021-10-10 DIAGNOSIS — Z743 Need for continuous supervision: Secondary | ICD-10-CM | POA: Diagnosis not present

## 2021-10-10 DIAGNOSIS — I5032 Chronic diastolic (congestive) heart failure: Secondary | ICD-10-CM | POA: Diagnosis not present

## 2021-10-10 DIAGNOSIS — S22008D Other fracture of unspecified thoracic vertebra, subsequent encounter for fracture with routine healing: Secondary | ICD-10-CM | POA: Diagnosis not present

## 2021-10-10 DIAGNOSIS — M8080XS Other osteoporosis with current pathological fracture, unspecified site, sequela: Secondary | ICD-10-CM | POA: Diagnosis not present

## 2021-10-10 DIAGNOSIS — Z79899 Other long term (current) drug therapy: Secondary | ICD-10-CM | POA: Diagnosis not present

## 2021-10-10 DIAGNOSIS — G9341 Metabolic encephalopathy: Secondary | ICD-10-CM | POA: Diagnosis not present

## 2021-10-10 DIAGNOSIS — Z7982 Long term (current) use of aspirin: Secondary | ICD-10-CM | POA: Diagnosis not present

## 2021-10-10 DIAGNOSIS — I69828 Other speech and language deficits following other cerebrovascular disease: Secondary | ICD-10-CM | POA: Diagnosis not present

## 2021-10-10 DIAGNOSIS — I69891 Dysphagia following other cerebrovascular disease: Secondary | ICD-10-CM | POA: Diagnosis not present

## 2021-10-10 DIAGNOSIS — Z86718 Personal history of other venous thrombosis and embolism: Secondary | ICD-10-CM | POA: Diagnosis not present

## 2021-10-10 DIAGNOSIS — E43 Unspecified severe protein-calorie malnutrition: Secondary | ICD-10-CM | POA: Diagnosis not present

## 2021-10-10 DIAGNOSIS — M6281 Muscle weakness (generalized): Secondary | ICD-10-CM | POA: Diagnosis not present

## 2021-10-10 MED ORDER — CALCIUM CARBONATE ANTACID 500 MG PO CHEW: 1.0000 | CHEWABLE_TABLET | Freq: Three times a day (TID) | ORAL | Status: DC

## 2021-10-10 MED ORDER — ACETAMINOPHEN 500 MG PO TABS
1000.0000 mg | ORAL_TABLET | Freq: Three times a day (TID) | ORAL | Status: DC
  Administered 2021-10-10: 1000 mg via ORAL
  Filled 2021-10-10: qty 2

## 2021-10-10 NOTE — TOC Progression Note (Signed)
Transition of Care [facility]) - Initial/Assessment Note    Patient Details  Name: [name] MRN: [id number] Date of Birth: 1930/03/02  Transition of Care [facility]) CM/SW Contact:    Milinda Antis, [place] Phone Number: 10/10/2021, 8:59 AM  Clinical Narrative:                 08:30-  CSW contacted [facility] in admissions at [facility], patient's facility of choice, the facility can accept the patient today.  D/C summary sent.  08:35-  CSW contacted [facility] and changed the patient's facility back to [facility].  CSW was informed by the representative that because the authorization was previously approved the patient can admit to [facility] today.  Insurance authorization reference number is [id number].  08:45-  CSW met with the patient at bedside and informed that the plan is to d/c to [place] today.  09:00- CSW contacted the patient's daughter and informed of the above information.  Pending: Transfer information from the facility.       Expected Discharge Plan: Skilled Nursing Facility Barriers to Discharge: Continued Medical Work up   Patient Goals and CMS Choice Patient states their goals for this hospitalization and ongoing recovery are:: SNF rehab CMS Medicare.gov Compare Post Acute Care list provided to:: Patient Represenative (must comment) [name]) Choice offered to / list presented to : Adult Children  Expected Discharge Plan and Services Expected Discharge Plan: Skilled Nursing Facility In-house Referral: Clinical Social Work Discharge Planning Services: CM Consult Post Acute Care Choice: [place] Living arrangements for the past 2 months: [place] Expected Discharge Date: 10/09/21               DME Arranged: N/A DME Agency: NA       HH Arranged: NA [place] Agency: NA        Prior Living Arrangements/Services Living arrangements for the past 2 months: [place] Lives with:: Facility  Resident Patient language and need for interpreter reviewed:: Yes            Current home services: DME (RW, cane) Criminal Activity/Legal Involvement Pertinent to Current Situation/Hospitalization: No - Comment as needed  Activities of Daily Living Home Assistive Devices/Equipment: Cane (specify quad or straight), Eyeglasses, Dentures (specify type), Walker (specify type) ADL Screening (condition at time of admission) Patient's cognitive ability adequate to safely complete daily activities?: No Is the patient deaf or have difficulty hearing?: Yes Does the patient have difficulty seeing, even when wearing glasses/contacts?: No Does the patient have difficulty concentrating, remembering, or making decisions?: Yes Patient able to express need for assistance with ADLs?: Yes Does the patient have difficulty dressing or bathing?: Yes Independently performs ADLs?: No Does the patient have difficulty walking or climbing stairs?: Yes Weakness of Legs: Both Weakness of Arms/Hands: None  Permission Sought/Granted                  Emotional Assessment       Orientation: : Oriented to Self Alcohol / Substance Use: Not Applicable Psych Involvement: No (comment)  Admission diagnosis:  Acute encephalopathy [G93.40] Altered mental status, unspecified altered mental status type [R41.82] Patient Active Problem List   Diagnosis Date Noted   Protein-calorie malnutrition, severe 10/09/2021   Acute encephalopathy 10/07/2021   Compression fracture of T7 vertebra  10/07/2021   Rib pain on right side 01/24/2021   Skin rash 01/24/2021   Mastalgia 01/24/2021   PE (pulmonary thromboembolism) ([place]) 12/25/2020   Lumbar  compression fracture (HCC) 11/16/2020   Generalized weakness 10/03/2020   GERD (gastroesophageal reflux disease)    Dyspnea    DVT (deep venous thrombosis) (HCC)    Diverticulitis    CAD (coronary artery disease)    Acute midline thoracic back pain 03/04/2020   Pressure  injury of skin 12/27/2019   S/p left hip fracture    Comminuted fracture of hip, left, open type I or II, initial encounter ([place]) 12/24/2019   Closed left hip fracture ([place]) 12/24/2019   ICH (intracerebral hemorrhage) ([place]) 11/04/2019   Intractable back pain 09/13/2019   Compression fracture of T9 vertebra ([place]) 09/13/2019   Chronic diastolic CHF (congestive heart failure) ([place]) 09/13/2019   Overweight (BMI 25.0-29.9) 09/13/2019   Constipation 09/13/2019   Raynaud's phenomenon without gangrene 05/19/2018   Iron deficiency anemia due to chronic blood loss 12/30/2017   Insomnia 06/25/2017   Mild CAD 05/24/2017   PCP NOTES >>>>>>>>> 02/15/2015   Superficial thrombophlebitis 04/06/2014   Annual physical exam 08/18/2012   Breast cancer ([place]) 11/05/2011   Polycythemia 11/05/2011   Stroke ([place]) 03/29/2011   OSA (obstructive sleep apnea) 03/29/2011   OA (osteoarthritis) of knee 03/29/2011   Hiatal hernia 03/29/2011   E. coli gastroenteritis 03/29/2011   Depression with anxiety 03/29/2011   Chest pain 01/16/2011   TIA (transient ischemic attack) 01/16/2011   CARDIOMYOPATHY 08/03/2009   DJD -- pain mgmt 08/16/2008   ABDOMINAL PAIN, LEFT LOWER QUADRANT 07/05/2008   GAIT DISTURBANCE 10/21/2007   ANXIETY DEPRESSION 10/08/2007   Fracture 09/10/2007   Hyperlipidemia 06/25/2007   Takotsubo cardiomyopathy 06/25/2007   GERD without esophagitis 06/25/2007   Essential hypertension 09/30/2006   Senile osteoporosis 09/30/2006   Migraine with aura 09/30/2006   Hyperplastic colon polyp 06/2001   PCP:  Colon Branch, MD Pharmacy:   [facility] DRUG STORE [phone] [address], [address] AT [facility] OF [place] [address] [place] [address] Phone: [phone] Fax: [phone]     Social Determinants of Health (SDOH) Interventions    Readmission Risk Interventions    09/14/2019   11:11 AM  Readmission Risk Prevention Plan  Medication Screening Complete   Transportation Screening Complete

## 2021-10-10 NOTE — TOC Transition Note (Signed)
Transition of Care [facility]) - CM/SW Discharge Note   Patient Details  Name: [name] MRN: [id number] Date of Birth: 1929/12/26  Transition of Care [facility]) CM/SW Contact:  Milinda Antis, [place] Phone Number: 10/10/2021, 9:12 AM   Clinical Narrative:    Patient will DC to: [facility] Anticipated DC date:  10/10/2021 Family notified: [facility] Transport by: Corey Harold   Per MD patient ready for DC to SNF. RN to call report prior to discharge [phone] room 103. RN, patient, patient's family, and facility notified of DC. Discharge Summary and FL2 sent to facility. DC packet on chart. Ambulance transport will be requested for patient.   CSW will sign off for now as social work intervention is no longer needed. Please consult [place] again if new needs arise.     Final next level of care: Skilled Nursing Facility Barriers to Discharge: Barriers Resolved   Patient Goals and CMS Choice Patient states their goals for this hospitalization and ongoing recovery are:: SNF rehab CMS Medicare.gov Compare Post Acute Care list provided to:: Patient Represenative (must comment) [name]) Choice offered to / list presented to : Adult Children  Discharge Placement              Patient chooses bed at:  ([facility]) Patient to be transferred to facility by: [place] Name of family member notified: [name] (Daughter)   [phone] Patient and family notified of of transfer: 10/09/21  Discharge Plan and Services In-house Referral: Clinical Social Work Discharge Planning Services: [employer] Consult Post Acute Care Choice: [place]          DME Arranged: N/A DME Agency: NA       HH Arranged: NA HH Agency: NA        Social Determinants of Health (SDOH) Interventions     Readmission Risk Interventions    09/14/2019   11:11 AM  Readmission Risk Prevention Plan  Medication Screening Complete  Transportation Screening Complete

## 2021-10-10 NOTE — Progress Notes (Signed)
DISCHARGE NOTE SNF [facility] to be discharged Skilled nursing facility per MD order. Patient verbalized understanding.  Skin clean, dry and intact without evidence of skin break down, no evidence of skin tears noted. IV catheter discontinued intact. Site without signs and symptoms of complications. Dressing and pressure applied. Pt denies pain at the site currently. No complaints noted.  Patient free of lines, drains, and wounds.   Discharge packet assembled. An After Visit Summary (AVS) was printed and given to the EMS personnel. Patient escorted via stretcher and discharged to [facility] via ambulance. Report called to accepting facility; all questions and concerns addressed.   Vira Agar, RN

## 2021-10-10 NOTE — Telephone Encounter (Signed)
Pt is at [facility] and dtr will let PCP know when pt is discharged home

## 2021-10-10 NOTE — Progress Notes (Signed)
Physical Therapy Treatment Patient Details Name: PRIA KLOSINSKI MRN: [id number] DOB: 1930-02-27 Today's Date: 10/10/2021   History of Present Illness CATHY CROUNSE is a 88 y.o. female presented to ED with AMS. She had a fall on 5/25 and was seen in ED and had previous falls before this.  Neighbors noticed her talking off the wall 5/26 and confusion has continued into5/27; with medical history significant of CAD, diastolic CHF, hx of CVA, hx of DVT after venous ablation and hx of PE in 7/22, HTN, HLD, polycythemia vera, hx of breast cancer who    PT Comments    Continuing work on functional mobility and activity tolerance;  Pt had a restless morning, in a lot of pain, and had just received pain meds and Ativan; Session focused on positioning for comfort in bed; Assisted pt in sliding her hips to be even with the joint in the bed and bolstered pt's trunk and knees with bed adjustments; Used a neck roll, bil small pillows at elbows, small heat packs at lateral ribs, both sides;  Pt's eyes drifting closed at end of session, however her pain unfortunately appears unchanged; discussed with Seth Bake, RN  Recommendations for follow up therapy are one component of a multi-disciplinary discharge planning process, led by the attending physician.  Recommendations may be updated based on patient status, additional functional criteria and insurance authorization.  Follow Up Recommendations  Skilled nursing-short term rehab (<3 hours/day)     Assistance Recommended at Discharge Frequent or constant Supervision/Assistance  Patient can return home with the following A lot of help with bathing/dressing/bathroom;A little help with walking and/or transfers;Direct supervision/assist for medications management;Help with stairs or ramp for entrance;Assistance with cooking/housework   Equipment Recommendations  Other (comment) (will consider shower seat and/or 3in1, depending on her apt setup)    Recommendations  for Other Services       Precautions / Restrictions Precautions Precautions: Fall;Back Precaution Comments: Try to follow back precautions for comfort     Mobility  Bed Mobility               General bed mobility comments: Used bed pad to help slide pt up towards [place] to get her hips in line with the joint of the bed; Pt able to pull to sit and brace/prop with both hands at her hips to scoot backwards for better positioning in the bed    Transfers                        Ambulation/Gait                   Stairs             Wheelchair Mobility    Modified Rankin (Stroke Patients Only)       Balance Overall balance assessment: History of Falls                                          Cognition Arousal/Alertness: Awake/alert, Lethargic, Suspect due to medications Behavior During Therapy: WFL for tasks assessed/performed, Flat affect Overall Cognitive Status: Impaired/Different from baseline                                 General Comments: Had recently recieved Ativan  Exercises      General Comments General comments (skin integrity, edema, etc.): Discussed pt status with RN, who reports she has been restless for most of the morning; recently received pain meds and Ativan; focused on positioning for relaxation in teh bed      Pertinent Vitals/Pain Pain Assessment Pain Assessment: Faces Faces Pain Scale: Hurts whole lot Pain Location: Back pain, lateral rib pain, both sides Pain Descriptors / Indicators: Grimacing Pain Intervention(s): Monitored during session, Repositioned, Heat applied    Home Living                          Prior Function            PT Goals (current goals can now be found in the care plan section) Acute Rehab PT Goals Patient Stated Goal: decrease pain PT Goal Formulation: With patient Time For Goal Achievement: 10/22/21 Potential to Achieve Goals:  Good Progress towards PT goals: Not progressing toward goals - comment (Limited by pain today)    Frequency    Min 2X/week      PT Plan Current plan remains appropriate;Frequency needs to be updated    Co-evaluation              AM-PAC PT "6 Clicks" Mobility   Outcome Measure  Help needed turning from your back to your side while in a flat bed without using bedrails?: A Little Help needed moving from lying on your back to sitting on the side of a flat bed without using bedrails?: A Lot Help needed moving to and from a bed to a chair (including a wheelchair)?: A Little Help needed standing up from a chair using your arms (e.g., wheelchair or bedside chair)?: A Lot Help needed to walk in hospital room?: A Little Help needed climbing 3-5 steps with a railing? : A Lot 6 Click Score: 15    End of Session Equipment Utilized During Treatment: Other (comment) (various pilows and bolsters) Activity Tolerance: Patient limited by pain Patient left: in bed;with call bell/phone within reach;with bed alarm set Nurse Communication: Mobility status PT Visit Diagnosis: Unsteadiness on feet (R26.81);Other abnormalities of gait and mobility (R26.89);Pain;History of falling (Z91.81);Muscle weakness (generalized) (M62.81);Repeated falls (R29.6) Pain - part of body:  (Back; numerous comression fxs (subacute and acute)     Time: 7564-3329 PT Time Calculation (min) (ACUTE ONLY): 19 min  Charges:  $Therapeutic Activity: 8-22 mins                     Roney Marion, [place] Office [place] 10/10/2021, 9:57 AM

## 2021-10-10 NOTE — Progress Notes (Signed)
Report called to [facility] prior to discharge.

## 2021-10-11 ENCOUNTER — Other Ambulatory Visit: Payer: Self-pay | Admitting: *Deleted

## 2021-10-11 DIAGNOSIS — I4891 Unspecified atrial fibrillation: Secondary | ICD-10-CM | POA: Diagnosis not present

## 2021-10-11 DIAGNOSIS — S22008D Other fracture of unspecified thoracic vertebra, subsequent encounter for fracture with routine healing: Secondary | ICD-10-CM | POA: Diagnosis not present

## 2021-10-11 DIAGNOSIS — I5030 Unspecified diastolic (congestive) heart failure: Secondary | ICD-10-CM | POA: Diagnosis not present

## 2021-10-11 LAB — URINE CULTURE: Culture: NO GROWTH

## 2021-10-11 NOTE — Patient Outreach (Signed)
Per [place] eligible member currently resides in [facility].  Screened for potential [facility] Care Management services as a benefit of member's insurance plan.  [name] admitted to SNF on 10/10/21 after hospitalization.  Member's PCP at [facility] has [place] care coordination team.  Facility site visit to [facility] skilled nursing facility. Met with [name], SNF SW to make aware writer is following for transition plan and potential [facility] needs.   Went to speak with [name] in her room at [facility]. However, was sleeping soundly.   Will follow up at later time.    [name], MSN, RN,BSN [place] Acute Care Coordinator [phone] [facility]) [phone]  (Toll free office)

## 2021-10-12 DIAGNOSIS — I2510 Atherosclerotic heart disease of native coronary artery without angina pectoris: Secondary | ICD-10-CM | POA: Diagnosis not present

## 2021-10-12 DIAGNOSIS — I130 Hypertensive heart and chronic kidney disease with heart failure and stage 1 through stage 4 chronic kidney disease, or unspecified chronic kidney disease: Secondary | ICD-10-CM | POA: Diagnosis not present

## 2021-10-12 DIAGNOSIS — M8080XS Other osteoporosis with current pathological fracture, unspecified site, sequela: Secondary | ICD-10-CM | POA: Diagnosis not present

## 2021-10-12 DIAGNOSIS — E43 Unspecified severe protein-calorie malnutrition: Secondary | ICD-10-CM | POA: Diagnosis not present

## 2021-10-16 DIAGNOSIS — I2510 Atherosclerotic heart disease of native coronary artery without angina pectoris: Secondary | ICD-10-CM | POA: Diagnosis not present

## 2021-10-16 DIAGNOSIS — M6281 Muscle weakness (generalized): Secondary | ICD-10-CM | POA: Diagnosis not present

## 2021-10-20 DIAGNOSIS — E559 Vitamin D deficiency, unspecified: Secondary | ICD-10-CM | POA: Diagnosis not present

## 2021-10-20 DIAGNOSIS — Z79899 Other long term (current) drug therapy: Secondary | ICD-10-CM | POA: Diagnosis not present

## 2021-10-20 DIAGNOSIS — E119 Type 2 diabetes mellitus without complications: Secondary | ICD-10-CM | POA: Diagnosis not present

## 2021-10-25 DIAGNOSIS — S22070D Wedge compression fracture of T9-T10 vertebra, subsequent encounter for fracture with routine healing: Secondary | ICD-10-CM | POA: Diagnosis not present

## 2021-10-25 DIAGNOSIS — M6281 Muscle weakness (generalized): Secondary | ICD-10-CM | POA: Diagnosis not present

## 2021-10-26 DIAGNOSIS — S22070D Wedge compression fracture of T9-T10 vertebra, subsequent encounter for fracture with routine healing: Secondary | ICD-10-CM | POA: Diagnosis not present

## 2021-10-27 DIAGNOSIS — I429 Cardiomyopathy, unspecified: Secondary | ICD-10-CM | POA: Diagnosis not present

## 2021-10-27 DIAGNOSIS — I2510 Atherosclerotic heart disease of native coronary artery without angina pectoris: Secondary | ICD-10-CM | POA: Diagnosis not present

## 2021-10-27 DIAGNOSIS — I5032 Chronic diastolic (congestive) heart failure: Secondary | ICD-10-CM | POA: Diagnosis not present

## 2021-10-27 DIAGNOSIS — I110 Hypertensive heart disease with heart failure: Secondary | ICD-10-CM | POA: Diagnosis not present

## 2021-10-27 DIAGNOSIS — S22060D Wedge compression fracture of T7-T8 vertebra, subsequent encounter for fracture with routine healing: Secondary | ICD-10-CM | POA: Diagnosis not present

## 2021-10-28 DIAGNOSIS — I110 Hypertensive heart disease with heart failure: Secondary | ICD-10-CM | POA: Diagnosis not present

## 2021-10-28 DIAGNOSIS — S22060D Wedge compression fracture of T7-T8 vertebra, subsequent encounter for fracture with routine healing: Secondary | ICD-10-CM | POA: Diagnosis not present

## 2021-10-28 DIAGNOSIS — I5032 Chronic diastolic (congestive) heart failure: Secondary | ICD-10-CM | POA: Diagnosis not present

## 2021-10-28 DIAGNOSIS — I2510 Atherosclerotic heart disease of native coronary artery without angina pectoris: Secondary | ICD-10-CM | POA: Diagnosis not present

## 2021-10-28 DIAGNOSIS — I429 Cardiomyopathy, unspecified: Secondary | ICD-10-CM | POA: Diagnosis not present

## 2021-10-30 ENCOUNTER — Telehealth: Payer: Self-pay | Admitting: Internal Medicine

## 2021-10-30 ENCOUNTER — Telehealth: Payer: Self-pay

## 2021-10-30 DIAGNOSIS — I5032 Chronic diastolic (congestive) heart failure: Secondary | ICD-10-CM | POA: Diagnosis not present

## 2021-10-30 DIAGNOSIS — I429 Cardiomyopathy, unspecified: Secondary | ICD-10-CM | POA: Diagnosis not present

## 2021-10-30 DIAGNOSIS — S22060D Wedge compression fracture of T7-T8 vertebra, subsequent encounter for fracture with routine healing: Secondary | ICD-10-CM | POA: Diagnosis not present

## 2021-10-30 DIAGNOSIS — I110 Hypertensive heart disease with heart failure: Secondary | ICD-10-CM | POA: Diagnosis not present

## 2021-10-30 DIAGNOSIS — I2510 Atherosclerotic heart disease of native coronary artery without angina pectoris: Secondary | ICD-10-CM | POA: Diagnosis not present

## 2021-10-30 NOTE — Telephone Encounter (Signed)
Patient released from nursing home   [place] from [place] homehealth needs VO for   Nursing, OT/PT evaluation 1x1     Call back :  [phone]

## 2021-10-30 NOTE — Telephone Encounter (Signed)
Caller/Agency: Santina Evans Number: [phone] Requesting OT/PT/Skilled Nursing/Social Work/Speech Therapy: nursing Frequency: 1x for 5 weeks.

## 2021-10-31 ENCOUNTER — Other Ambulatory Visit: Payer: Self-pay | Admitting: *Deleted

## 2021-10-31 NOTE — Telephone Encounter (Signed)
Verbal orders given to [facility] and she will let patient know when she sees her tomorrow that she needs to make a follow up visit.

## 2021-10-31 NOTE — Patient Outreach (Signed)
[place] Coordinator follow up. [facility] eligible member screened for potential [facility] Care Management services as a benefit of member's insurance plan.  Verified in [facility] [name] transitioned to home from [facility] on 10/26/21. She returned to [facility]. She has [facility].   No identifiable [facility] needs.     Dawn Rolling, MSN, RN,BSN [place] Acute Care Coordinator [phone] [facility]) [phone]  (Toll free office)

## 2021-10-31 NOTE — Telephone Encounter (Signed)
Verbal orders given  

## 2021-10-31 NOTE — Telephone Encounter (Signed)
Okay to provide verbal orders.  Also arrange a non-urgent follow-up with me at this office

## 2021-11-01 ENCOUNTER — Telehealth: Payer: Self-pay

## 2021-11-01 NOTE — Telephone Encounter (Signed)
Verbal orders  For OT 1x a week for 4 weeks

## 2021-11-03 ENCOUNTER — Telehealth: Payer: Self-pay

## 2021-11-03 DIAGNOSIS — I5032 Chronic diastolic (congestive) heart failure: Secondary | ICD-10-CM | POA: Diagnosis not present

## 2021-11-03 DIAGNOSIS — I110 Hypertensive heart disease with heart failure: Secondary | ICD-10-CM | POA: Diagnosis not present

## 2021-11-03 DIAGNOSIS — I2510 Atherosclerotic heart disease of native coronary artery without angina pectoris: Secondary | ICD-10-CM | POA: Diagnosis not present

## 2021-11-03 DIAGNOSIS — I429 Cardiomyopathy, unspecified: Secondary | ICD-10-CM | POA: Diagnosis not present

## 2021-11-03 DIAGNOSIS — S22060D Wedge compression fracture of T7-T8 vertebra, subsequent encounter for fracture with routine healing: Secondary | ICD-10-CM | POA: Diagnosis not present

## 2021-11-05 ENCOUNTER — Telehealth: Payer: Self-pay | Admitting: Internal Medicine

## 2021-11-06 NOTE — Telephone Encounter (Signed)
Requesting: lorazepam 0.5mg   Contract: 09/19/21 UDS: 06/29/19  Last Visit: 09/19/21 Next Visit: 11/07/21 Last Refill: 10/09/21 #5 and 0RF (by hospitalist at discharge)        08/09/21 #60 and 2RF  Please Advise

## 2021-11-07 ENCOUNTER — Ambulatory Visit: Payer: Medicare Other | Admitting: Internal Medicine

## 2021-11-07 DIAGNOSIS — I429 Cardiomyopathy, unspecified: Secondary | ICD-10-CM | POA: Diagnosis not present

## 2021-11-07 DIAGNOSIS — I110 Hypertensive heart disease with heart failure: Secondary | ICD-10-CM | POA: Diagnosis not present

## 2021-11-07 DIAGNOSIS — I5032 Chronic diastolic (congestive) heart failure: Secondary | ICD-10-CM | POA: Diagnosis not present

## 2021-11-07 DIAGNOSIS — I2510 Atherosclerotic heart disease of native coronary artery without angina pectoris: Secondary | ICD-10-CM | POA: Diagnosis not present

## 2021-11-07 DIAGNOSIS — S22060D Wedge compression fracture of T7-T8 vertebra, subsequent encounter for fracture with routine healing: Secondary | ICD-10-CM | POA: Diagnosis not present

## 2021-11-08 DIAGNOSIS — I429 Cardiomyopathy, unspecified: Secondary | ICD-10-CM | POA: Diagnosis not present

## 2021-11-08 DIAGNOSIS — I5032 Chronic diastolic (congestive) heart failure: Secondary | ICD-10-CM | POA: Diagnosis not present

## 2021-11-08 DIAGNOSIS — S22060D Wedge compression fracture of T7-T8 vertebra, subsequent encounter for fracture with routine healing: Secondary | ICD-10-CM | POA: Diagnosis not present

## 2021-11-08 DIAGNOSIS — I2510 Atherosclerotic heart disease of native coronary artery without angina pectoris: Secondary | ICD-10-CM | POA: Diagnosis not present

## 2021-11-08 DIAGNOSIS — I110 Hypertensive heart disease with heart failure: Secondary | ICD-10-CM | POA: Diagnosis not present

## 2021-11-09 ENCOUNTER — Telehealth: Payer: Medicare Other

## 2021-11-09 ENCOUNTER — Telehealth: Payer: Self-pay | Admitting: Internal Medicine

## 2021-11-09 ENCOUNTER — Telehealth: Payer: Self-pay | Admitting: Pharmacist

## 2021-11-09 NOTE — Telephone Encounter (Signed)
FYI. Pt has an appt on 11/21/21.

## 2021-11-09 NOTE — Telephone Encounter (Signed)
Last OV note faxed.

## 2021-11-09 NOTE — Telephone Encounter (Signed)
Spoke w/ Alvis Lemmings RN, informed of recommendations. RN verbalized understanding.

## 2021-11-09 NOTE — Telephone Encounter (Signed)
[facility] nurse just wanted to mention pt's bp is a little elevated, 162/95. She has no sxs like headache but just wants pcp to be aware. If they are any questions she can be reached at [phone].

## 2021-11-09 NOTE — Telephone Encounter (Signed)
Holly from [place] is requesting most recent ov notes. This can be faxed to [phone].

## 2021-11-09 NOTE — Telephone Encounter (Signed)
  Care Management   Follow Up Note   11/09/2021 Name: [name] MRN: [id number] DOB: 1929/07/16   Referred by: Colon Branch, MD Reason for referral : No chief complaint on file.   An unsuccessful telephone outreach was attempted today. The patient was referred to the case management team for assistance with care management and care coordination.   Unable to LM on VM due to MB full. Will forward to Chronic Care Management scheduler to reach out to reschedule.   Follow Up Plan: The care management team will reach out to the patient again over the next 30 days.   Cherre Robins, PharmD Clinical Pharmacist [facility]

## 2021-11-09 NOTE — Telephone Encounter (Signed)
  Advice nurse: As long as it is less than 160/90 is "okay". If she has symptoms let me know Continue checking Otherwise we will see her 11/21/2021

## 2021-11-10 ENCOUNTER — Telehealth: Payer: Self-pay

## 2021-11-10 NOTE — Telephone Encounter (Signed)
Noted  

## 2021-11-10 NOTE — Telephone Encounter (Signed)
[facility] PT called in stated that patient refused treatment today.

## 2021-11-10 NOTE — Chronic Care Management (AMB) (Signed)
  Chronic Care Management Note  11/10/2021 Name: [name] MRN: [id number] DOB: [birth year]  [name] is a 88 y.o. year old female who is a primary care patient of Colon Branch, MD and is actively engaged with the care management team. I reached out to Dorris Carnes by phone today to assist with re-scheduling a follow up visit with the Pharmacist  Follow up plan: Unsuccessful telephone outreach attempt made. A HIPAA compliant phone message was left for the patient providing contact information and requesting a return call.   Julian Hy, [place] Management  Direct Dial: [phone]

## 2021-11-16 DIAGNOSIS — I110 Hypertensive heart disease with heart failure: Secondary | ICD-10-CM | POA: Diagnosis not present

## 2021-11-16 DIAGNOSIS — S22060D Wedge compression fracture of T7-T8 vertebra, subsequent encounter for fracture with routine healing: Secondary | ICD-10-CM | POA: Diagnosis not present

## 2021-11-16 DIAGNOSIS — I2510 Atherosclerotic heart disease of native coronary artery without angina pectoris: Secondary | ICD-10-CM | POA: Diagnosis not present

## 2021-11-16 DIAGNOSIS — I5032 Chronic diastolic (congestive) heart failure: Secondary | ICD-10-CM | POA: Diagnosis not present

## 2021-11-16 DIAGNOSIS — I429 Cardiomyopathy, unspecified: Secondary | ICD-10-CM | POA: Diagnosis not present

## 2021-11-21 ENCOUNTER — Encounter: Payer: Self-pay | Admitting: Internal Medicine

## 2021-11-21 ENCOUNTER — Ambulatory Visit: Payer: Medicare Other | Admitting: Internal Medicine

## 2021-11-22 NOTE — Chronic Care Management (AMB) (Signed)
  Chronic Care Management Note  11/22/2021 Name: [name] MRN: [id number] DOB: [birth year]  [name] is a 88 y.o. year old female who is a primary care patient of Colon Branch, MD and is actively engaged with the care management team. I reached out to Dorris Carnes by phone today to assist with re-scheduling a follow up visit with the Pharmacist  Follow up plan: 2nd Unsuccessful telephone outreach attempt made. A HIPAA compliant phone message was left for the patient providing contact information and requesting a return call.   Julian Hy, [place] Direct Dial: [phone]

## 2021-11-24 ENCOUNTER — Observation Stay (HOSPITAL_COMMUNITY)
Admission: EM | Admit: 2021-11-24 | Discharge: 2021-11-28 | Disposition: A | Discharge status: Skilled Nursing Facility | Payer: Medicare Other | Attending: Student | Admitting: Student

## 2021-11-24 ENCOUNTER — Emergency Department (HOSPITAL_COMMUNITY): Payer: Medicare Other

## 2021-11-24 ENCOUNTER — Other Ambulatory Visit: Payer: Self-pay

## 2021-11-24 DIAGNOSIS — M25531 Pain in right wrist: Secondary | ICD-10-CM | POA: Diagnosis not present

## 2021-11-24 DIAGNOSIS — Z853 Personal history of malignant neoplasm of breast: Secondary | ICD-10-CM | POA: Insufficient documentation

## 2021-11-24 DIAGNOSIS — S5291XA Unspecified fracture of right forearm, initial encounter for closed fracture: Secondary | ICD-10-CM

## 2021-11-24 DIAGNOSIS — W19XXXA Unspecified fall, initial encounter: Secondary | ICD-10-CM | POA: Diagnosis not present

## 2021-11-24 DIAGNOSIS — F17210 Nicotine dependence, cigarettes, uncomplicated: Secondary | ICD-10-CM | POA: Insufficient documentation

## 2021-11-24 DIAGNOSIS — Z86718 Personal history of other venous thrombosis and embolism: Secondary | ICD-10-CM | POA: Diagnosis not present

## 2021-11-24 DIAGNOSIS — M85851 Other specified disorders of bone density and structure, right thigh: Secondary | ICD-10-CM | POA: Diagnosis not present

## 2021-11-24 DIAGNOSIS — W01198A Fall on same level from slipping, tripping and stumbling with subsequent striking against other object, initial encounter: Secondary | ICD-10-CM | POA: Diagnosis not present

## 2021-11-24 DIAGNOSIS — S40211A Abrasion of right shoulder, initial encounter: Secondary | ICD-10-CM | POA: Diagnosis not present

## 2021-11-24 DIAGNOSIS — M79651 Pain in right thigh: Secondary | ICD-10-CM | POA: Diagnosis not present

## 2021-11-24 DIAGNOSIS — Z7982 Long term (current) use of aspirin: Secondary | ICD-10-CM | POA: Diagnosis not present

## 2021-11-24 DIAGNOSIS — S0083XA Contusion of other part of head, initial encounter: Secondary | ICD-10-CM

## 2021-11-24 DIAGNOSIS — T1490XA Injury, unspecified, initial encounter: Secondary | ICD-10-CM | POA: Diagnosis not present

## 2021-11-24 DIAGNOSIS — Z743 Need for continuous supervision: Secondary | ICD-10-CM | POA: Diagnosis not present

## 2021-11-24 DIAGNOSIS — Z79899 Other long term (current) drug therapy: Secondary | ICD-10-CM | POA: Insufficient documentation

## 2021-11-24 DIAGNOSIS — M25539 Pain in unspecified wrist: Secondary | ICD-10-CM | POA: Diagnosis not present

## 2021-11-24 DIAGNOSIS — H05231 Hemorrhage of right orbit: Secondary | ICD-10-CM

## 2021-11-24 DIAGNOSIS — M25551 Pain in right hip: Secondary | ICD-10-CM | POA: Diagnosis not present

## 2021-11-24 DIAGNOSIS — I110 Hypertensive heart disease with heart failure: Secondary | ICD-10-CM | POA: Diagnosis not present

## 2021-11-24 DIAGNOSIS — I5032 Chronic diastolic (congestive) heart failure: Secondary | ICD-10-CM | POA: Diagnosis present

## 2021-11-24 DIAGNOSIS — S52501A Unspecified fracture of the lower end of right radius, initial encounter for closed fracture: Secondary | ICD-10-CM | POA: Diagnosis not present

## 2021-11-24 DIAGNOSIS — S52551A Other extraarticular fracture of lower end of right radius, initial encounter for closed fracture: Secondary | ICD-10-CM | POA: Diagnosis not present

## 2021-11-24 DIAGNOSIS — S0181XA Laceration without foreign body of other part of head, initial encounter: Secondary | ICD-10-CM

## 2021-11-24 DIAGNOSIS — I2510 Atherosclerotic heart disease of native coronary artery without angina pectoris: Secondary | ICD-10-CM | POA: Insufficient documentation

## 2021-11-24 DIAGNOSIS — R6889 Other general symptoms and signs: Secondary | ICD-10-CM | POA: Diagnosis not present

## 2021-11-24 DIAGNOSIS — S0081XA Abrasion of other part of head, initial encounter: Secondary | ICD-10-CM

## 2021-11-24 DIAGNOSIS — Z96652 Presence of left artificial knee joint: Secondary | ICD-10-CM | POA: Diagnosis not present

## 2021-11-24 DIAGNOSIS — M19011 Primary osteoarthritis, right shoulder: Secondary | ICD-10-CM | POA: Diagnosis not present

## 2021-11-24 DIAGNOSIS — Z8673 Personal history of transient ischemic attack (TIA), and cerebral infarction without residual deficits: Secondary | ICD-10-CM | POA: Insufficient documentation

## 2021-11-24 DIAGNOSIS — S41011A Laceration without foreign body of right shoulder, initial encounter: Secondary | ICD-10-CM | POA: Diagnosis not present

## 2021-11-24 DIAGNOSIS — S7011XA Contusion of right thigh, initial encounter: Secondary | ICD-10-CM

## 2021-11-24 DIAGNOSIS — I6381 Other cerebral infarction due to occlusion or stenosis of small artery: Secondary | ICD-10-CM | POA: Diagnosis not present

## 2021-11-24 DIAGNOSIS — S0033XA Contusion of nose, initial encounter: Secondary | ICD-10-CM

## 2021-11-24 DIAGNOSIS — I10 Essential (primary) hypertension: Secondary | ICD-10-CM | POA: Diagnosis present

## 2021-11-24 LAB — CBC WITH DIFFERENTIAL/PLATELET
Abs Immature Granulocytes: 0.04 10*3/uL (ref 0.00–0.07)
Basophils Absolute: 0.1 10*3/uL (ref 0.0–0.1)
Basophils Relative: 1 %
Eosinophils Absolute: 0.1 10*3/uL (ref 0.0–0.5)
Eosinophils Relative: 1 %
HCT: 42.2 % (ref 36.0–46.0)
Hemoglobin: 13.5 g/dL (ref 12.0–15.0)
Immature Granulocytes: 0 %
Lymphocytes Relative: 4 %
Lymphs Abs: 0.5 10*3/uL — ABNORMAL LOW (ref 0.7–4.0)
MCH: 30.9 pg (ref 26.0–34.0)
MCHC: 32 g/dL (ref 30.0–36.0)
MCV: 96.6 fL (ref 80.0–100.0)
Monocytes Absolute: 0.9 10*3/uL (ref 0.1–1.0)
Monocytes Relative: 8 %
Neutro Abs: 10.1 10*3/uL — ABNORMAL HIGH (ref 1.7–7.7)
Neutrophils Relative %: 86 %
Platelets: 391 10*3/uL (ref 150–400)
RBC: 4.37 MIL/uL (ref 3.87–5.11)
RDW: 14.6 % (ref 11.5–15.5)
WBC: 11.7 10*3/uL — ABNORMAL HIGH (ref 4.0–10.5)
nRBC: 0 % (ref 0.0–0.2)

## 2021-11-24 LAB — BASIC METABOLIC PANEL
Anion gap: 5 (ref 5–15)
BUN: 12 mg/dL (ref 8–23)
CO2: 23 mmol/L (ref 22–32)
Calcium: 6.8 mg/dL — ABNORMAL LOW (ref 8.9–10.3)
Chloride: 110 mmol/L (ref 98–111)
Creatinine, Ser: 0.36 mg/dL — ABNORMAL LOW (ref 0.44–1.00)
GFR, Estimated: >60 mL/min (ref >60)
Glucose, Bld: 119 mg/dL — ABNORMAL HIGH (ref 70–99)
Potassium: 3 mmol/L — ABNORMAL LOW (ref 3.5–5.1)
Sodium: 138 mmol/L (ref 135–145)

## 2021-11-24 MED ORDER — MORPHINE SULFATE (PF) 4 MG/ML IV SOLN
4.0000 mg | Freq: Once | INTRAVENOUS | Status: AC
  Administered 2021-11-24: 4 mg via INTRAVENOUS
  Filled 2021-11-24: qty 1

## 2021-11-24 MED ORDER — ONDANSETRON HCL 4 MG/2ML IJ SOLN
4.0000 mg | Freq: Once | INTRAMUSCULAR | Status: AC
  Administered 2021-11-24: 4 mg via INTRAVENOUS
  Filled 2021-11-24: qty 2

## 2021-11-24 MED ORDER — HYDROCODONE-ACETAMINOPHEN 5-325 MG PO TABS
1.0000 | ORAL_TABLET | Freq: Once | ORAL | Status: AC
  Administered 2021-11-24: 1 via ORAL
  Filled 2021-11-24: qty 1

## 2021-11-24 NOTE — ED Provider Notes (Signed)
[name]   CSN: [id number] Arrival date & time: 11/24/21  1947     History  Chief Complaint  Patient presents with   [name]    [name] is a 88 y.o. female.  Patient is a 88 year old female with a history of CVA, hyperlipidemia, prior DVT, cardiomyopathy, CAD, CHF, polycythemia and hypertension presenting to the emergency room via EMS today after a fall.  Patient reports that she was walking outside and she fell forward and hit her head on the floor.  She is not exactly sure what caused her to fall but she fell face first.  She is complaining of pain over her face, chin, right wrist and later started complaining about pain in her right hip.  She denies loss of consciousness and reports she takes an aspirin but no other anticoagulation.  She denies feeling short of breath and reports she is just breathing heavily because she is hurting.  She repeatedly asked for pain medication.  She denies any abdominal pain or chest pain.  She reports that this is the fourth time she has fallen in 2 years.  She does remember the entire event.  Prior to this she was in her normal state of health.  The history is provided by the patient.  Fall       Home Medications Prior to Admission medications   Medication Sig Start Date End Date Taking? Authorizing Provider  alendronate (FOSAMAX) 70 MG tablet TAKE 1 TABLET(70 MG) BY MOUTH EVERY 7 DAYS WITH A FULL GLASS OF WATER AND ON AN EMPTY STOMACH Patient taking differently: Take 70 mg by mouth every Sunday. 08/09/21   Colon Branch, MD  aspirin EC 81 MG tablet Take 81 mg by mouth daily. Swallow whole.    [provider]  furosemide (LASIX) 20 MG tablet Take 1 tablet (20 mg total) by mouth every other day. 03/09/21   Richardo Priest, MD  Homeopathic Products (SINUS MEDICINE PO) Take 1 tablet by mouth every 6 (six) hours as needed (sinus congestion).    [provider]  lactose free  nutrition (BOOST) LIQD Take 237 mLs by mouth every other day.    [provider]  LORazepam (ATIVAN) 0.5 MG tablet Take 1 tablet (0.5 mg total) by mouth 2 (two) times daily as needed for anxiety. 11/06/21   Colon Branch, MD  metoprolol tartrate (LOPRESSOR) 50 MG tablet TAKE 1 TABLET(50 MG) BY MOUTH TWICE DAILY Patient taking differently: Take 50 mg by mouth 2 (two) times daily. 08/08/21   Colon Branch, MD  nitroGLYCERIN (NITROSTAT) 0.4 MG SL tablet Place 1 tablet (0.4 mg total) under the tongue every 5 (five) minutes x 3 doses as needed for chest pain. 07/25/20   Colon Branch, MD  omeprazole (PRILOSEC) 20 MG capsule Take 1 capsule (20 mg total) by mouth daily. Patient taking differently: Take 20 mg by mouth daily as needed (heartburn). 10/05/19   Granville Lewis C, PA-C  traMADol (ULTRAM) 50 MG tablet Take 1 tablet (50 mg total) by mouth every 12 (twelve) hours as needed for severe pain. 10/09/21   Mariel Aloe, MD  valACYclovir (VALTREX) 500 MG tablet TAKE 2 TABLETS BY MOUTH AT ONSET OF RASH AND 2 TABLETS 12 HOURS LATER AS NEEDED Patient taking differently: Take 1,000 mg by mouth See admin instructions. Take at the onset of rash and repeat 12 hours later as needed. 02/17/21 02/17/22  Volanda Napoleon, MD  Allergies    Penicillins, Valsartan, Atorvastatin, Carvedilol, Ezetimibe, Fluvastatin sodium, Magnesium hydroxide, Meloxicam, Pneumovax [pneumococcal polysaccharide vaccine], Quinapril hcl, Simvastatin, Topamax [topiramate], and Vit d-vit e-safflower oil    Review of Systems   Review of Systems  Physical Exam Updated Vital Signs BP (!) 188/97   Pulse 77   Temp 97.9 F (36.6 C) (Oral)   Resp 16   Ht '5\' 6"'$  (1.676 m)   Wt 57.6 kg   SpO2 (!) 89%   BMI 20.50 kg/m  Physical Exam Vitals and nursing Parrish reviewed.  Constitutional:      General: She is not in acute distress.    Appearance: She is well-developed.  HENT:     Head: Normocephalic. Abrasion, contusion and laceration  present.      Comments: No acute dental trauma Eyes:     Pupils: Pupils are equal, round, and reactive to light.  Cardiovascular:     Rate and Rhythm: Normal rate and regular rhythm.     Pulses: Normal pulses.     Heart sounds: Normal heart sounds. No murmur heard.    No friction rub.  Pulmonary:     Effort: Pulmonary effort is normal.     Breath sounds: Normal breath sounds. No wheezing or rales.  Abdominal:     General: Bowel sounds are normal. There is no distension.     Palpations: Abdomen is soft.     Tenderness: There is no abdominal tenderness. There is no guarding or rebound.  Musculoskeletal:     Right elbow: Normal.     Right wrist: Swelling, deformity, tenderness and bony tenderness present. Decreased range of motion.       Arms:     Right hip: Tenderness and bony tenderness present. Normal range of motion.     Right knee: Normal.     Left knee: Normal.     Right ankle: Normal.     Left ankle: Normal.     Comments: No edema  Skin:    General: Skin is warm and dry.     Findings: Bruising present. No rash.     Comments: Bruising diffusely over the upper and lower extremities in various stages of healing  Neurological:     Mental Status: She is alert and oriented to person, place, and time.     Cranial Nerves: No cranial nerve deficit.  Psychiatric:        Behavior: Behavior normal.     ED Results / Procedures / Treatments   Labs (all labs ordered are listed, but only abnormal results are displayed) Labs Reviewed - No data to display  EKG None  Radiology CT Head Wo Contrast  Result Date: 11/24/2021 CLINICAL DATA:  Trauma. EXAM: CT HEAD WITHOUT CONTRAST CT MAXILLOFACIAL WITHOUT CONTRAST CT CERVICAL SPINE WITHOUT CONTRAST TECHNIQUE: Multidetector CT imaging of the head, cervical spine, and maxillofacial structures were performed using the standard protocol without intravenous contrast. Multiplanar CT image reconstructions of the cervical spine and  maxillofacial structures were also generated. RADIATION DOSE REDUCTION: This exam was performed according to the departmental dose-optimization program which includes automated exposure control, adjustment of the mA and/or kV according to patient size and/or use of iterative reconstruction technique. COMPARISON:  Brain MRI dated 10/08/2018 and CT dated 10/07/2021. FINDINGS: CT HEAD FINDINGS Brain: Moderate age-related atrophy and chronic microvascular ischemic changes. Old right thalamic lacunar infarct. There is no acute intracranial hemorrhage. No mass effect or midline shift. No extra-axial fluid collection. Vascular: No hyperdense vessel or unexpected calcification. Skull:  Normal. Negative for fracture or focal lesion. Other: Small right forehead hematoma. CT MAXILLOFACIAL FINDINGS Osseous: No acute fracture or subluxation. Orbits: Negative. No traumatic or inflammatory finding. Sinuses: Clear. Soft tissues: There is soft tissue swelling and thickening of the tip of the nose. Right supraorbital hematoma. CT CERVICAL SPINE FINDINGS Alignment: No acute subluxation. Skull base and vertebrae: No acute fracture.  Osteopenia. Soft tissues and spinal canal: No prevertebral fluid or swelling. No visible canal hematoma. Disc levels:  No acute findings.  Mild degenerative changes. Upper chest: Negative. Other: Bilateral carotid bulb calcified plaques. IMPRESSION: 1. No acute intracranial pathology. Moderate age-related atrophy and chronic microvascular ischemic changes. 2. No acute facial bone fractures. 3. No acute cervical spine pathology. Electronically Signed   By: Anner Crete M.D.   On: 11/24/2021 21:40   CT Maxillofacial Wo Contrast  Result Date: 11/24/2021 CLINICAL DATA:  Trauma. EXAM: CT HEAD WITHOUT CONTRAST CT MAXILLOFACIAL WITHOUT CONTRAST CT CERVICAL SPINE WITHOUT CONTRAST TECHNIQUE: Multidetector CT imaging of the head, cervical spine, and maxillofacial structures were performed using the standard  protocol without intravenous contrast. Multiplanar CT image reconstructions of the cervical spine and maxillofacial structures were also generated. RADIATION DOSE REDUCTION: This exam was performed according to the departmental dose-optimization program which includes automated exposure control, adjustment of the mA and/or kV according to patient size and/or use of iterative reconstruction technique. COMPARISON:  Brain MRI dated 10/08/2018 and CT dated 10/07/2021. FINDINGS: CT HEAD FINDINGS Brain: Moderate age-related atrophy and chronic microvascular ischemic changes. Old right thalamic lacunar infarct. There is no acute intracranial hemorrhage. No mass effect or midline shift. No extra-axial fluid collection. Vascular: No hyperdense vessel or unexpected calcification. Skull: Normal. Negative for fracture or focal lesion. Other: Small right forehead hematoma. CT MAXILLOFACIAL FINDINGS Osseous: No acute fracture or subluxation. Orbits: Negative. No traumatic or inflammatory finding. Sinuses: Clear. Soft tissues: There is soft tissue swelling and thickening of the tip of the nose. Right supraorbital hematoma. CT CERVICAL SPINE FINDINGS Alignment: No acute subluxation. Skull base and vertebrae: No acute fracture.  Osteopenia. Soft tissues and spinal canal: No prevertebral fluid or swelling. No visible canal hematoma. Disc levels:  No acute findings.  Mild degenerative changes. Upper chest: Negative. Other: Bilateral carotid bulb calcified plaques. IMPRESSION: 1. No acute intracranial pathology. Moderate age-related atrophy and chronic microvascular ischemic changes. 2. No acute facial bone fractures. 3. No acute cervical spine pathology. Electronically Signed   By: Anner Crete M.D.   On: 11/24/2021 21:40   CT Cervical Spine Wo Contrast  Result Date: 11/24/2021 CLINICAL DATA:  Trauma. EXAM: CT HEAD WITHOUT CONTRAST CT MAXILLOFACIAL WITHOUT CONTRAST CT CERVICAL SPINE WITHOUT CONTRAST TECHNIQUE: Multidetector CT  imaging of the head, cervical spine, and maxillofacial structures were performed using the standard protocol without intravenous contrast. Multiplanar CT image reconstructions of the cervical spine and maxillofacial structures were also generated. RADIATION DOSE REDUCTION: This exam was performed according to the departmental dose-optimization program which includes automated exposure control, adjustment of the mA and/or kV according to patient size and/or use of iterative reconstruction technique. COMPARISON:  Brain MRI dated 10/08/2018 and CT dated 10/07/2021. FINDINGS: CT HEAD FINDINGS Brain: Moderate age-related atrophy and chronic microvascular ischemic changes. Old right thalamic lacunar infarct. There is no acute intracranial hemorrhage. No mass effect or midline shift. No extra-axial fluid collection. Vascular: No hyperdense vessel or unexpected calcification. Skull: Normal. Negative for fracture or focal lesion. Other: Small right forehead hematoma. CT MAXILLOFACIAL FINDINGS Osseous: No acute fracture or subluxation. Orbits: Negative.  No traumatic or inflammatory finding. Sinuses: Clear. Soft tissues: There is soft tissue swelling and thickening of the tip of the nose. Right supraorbital hematoma. CT CERVICAL SPINE FINDINGS Alignment: No acute subluxation. Skull base and vertebrae: No acute fracture.  Osteopenia. Soft tissues and spinal canal: No prevertebral fluid or swelling. No visible canal hematoma. Disc levels:  No acute findings.  Mild degenerative changes. Upper chest: Negative. Other: Bilateral carotid bulb calcified plaques. IMPRESSION: 1. No acute intracranial pathology. Moderate age-related atrophy and chronic microvascular ischemic changes. 2. No acute facial bone fractures. 3. No acute cervical spine pathology. Electronically Signed   By: Anner Crete M.D.   On: 11/24/2021 21:40   DG Wrist Complete Right  Result Date: 11/24/2021 CLINICAL DATA:  [place], right wrist pain EXAM: RIGHT WRIST  - COMPLETE 3+ VIEW COMPARISON:  None Available. FINDINGS: Frontal, oblique, lateral, and ulnar deviated views of the right wrist are obtained. There is a minimally displaced and impacted extra-articular fracture of the distal right radius, best seen on the oblique projection. No other acute displaced fractures. Severe osteoarthritis within the radial aspect of the carpus and first carpometacarpal joint. Mild diffuse soft tissue swelling. IMPRESSION: 1. Minimally impacted extra-articular fracture of the distal right radius. 2. 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 Electronically Signed   By: Randa Ngo M.D.   On: 11/24/2021 21:37   DG Shoulder Right  Result Date: 11/24/2021 CLINICAL DATA:  [place], hit head, right shoulder laceration EXAM: RIGHT SHOULDER - 2+ VIEW COMPARISON:  None Available. FINDINGS: Internal rotation, external rotation, transscapular views are obtained. No acute fracture, subluxation, or dislocation. Moderate hypertrophic changes and spurring are seen at the acromioclavicular joint. There is mild glenohumeral joint space narrowing. Visualized portions of the right chest are clear. IMPRESSION: 1. Degenerative changes.  No acute fracture. Electronically Signed   By: Randa Ngo M.D.   On: 11/24/2021 21:20    Procedures Procedures    Medications Ordered in ED Medications  HYDROcodone-acetaminophen (NORCO/VICODIN) 5-325 MG per tablet 1 tablet (1 tablet Oral Given 11/24/21 2032)  morphine (PF) 4 MG/ML injection 4 mg (4 mg Intravenous Given 11/24/21 2156)  ondansetron (ZOFRAN) injection 4 mg (4 mg Intravenous Given 11/24/21 2156)    ED Course/ Medical Decision Making/ A&P                           Medical Decision Making Amount and/or Complexity of Data Reviewed Labs: ordered. Radiology: ordered.  Risk Prescription drug management.   Pt with multiple medical problems and comorbidities and presenting today with a complaint that caries a high risk for  morbidity and mortality.  Here today after a fall where she sustained injury to her face, chin, right shoulder, right wrist and right hip.  Patient is complaining of repeated pain but seems mostly to be in her face and wrist.  Patient did have some arterial bleeding from a laceration on her chin which was repaired as above.  She is awake alert and oriented.  She appears to be at her baseline at this time.  Moving extremities without difficulty.  She does appear to have deformity swelling and pain of the right wrist and concern for fracture.  She does not take anticoagulation other than a baby aspirin.  No suspicion for syncope causing her fall she does not appear to be weak and low suspicion for stroke or other acute process. I have independently visualized and interpreted pt's images today.  Head CT and cervical spine are negative for fracture.  Maxillofacial CT without acute fracture.  Right shoulder imaging without fracture and right wrist with distal radius fracture.  Radiology reports there is no acute abnormalities with head face or neck but does show moderate age-related atrophy and chronic microvascular ischemic changes, shoulder imaging showed degenerative changes and wrist showed severe osteoarthritis with minimally impacted extra-articular fracture of the distal right radius.  Patient will need follow-up with orthopedics in 1 week as an outpatient.  She will keep splint intact until that time.  Patient did not have pain control with hydrocodone and was given IV morphine.  After initial evaluation she started complaining more of right hip pain and plain films were ordered.  11:14 PM I independently interpreted patient's right hip image and visualized it but there is no evidence of fracture.  On repeat evaluation patient is complaining of hip pain however when her clothing was removed and palpating down her femur she has mid to lower femur pain with noted swelling and pain concerning for possible distal  femur fracture.  No acute knee involvement.  Patient still having significant pain.           Final Clinical Impression(s) / ED Diagnoses Final diagnoses:  None    Rx / DC Orders ED Discharge Orders     None         Blanchie Dessert, MD 11/24/21 2316

## 2021-11-24 NOTE — ED Triage Notes (Addendum)
Patient BIB EMS from ALF( [facility]). Per report patient walking outside then fell forward and hit her head in the floor. Pt sustain hematoma in her right forehead ,skin tear on her right shoulder and laceration in the bottom of her chin. Patient c/o right wrist pain. Patient denies LOC. Pt a/ox4.  BP 172/90 HR 76 RR 20 O2sat 95% on RA

## 2021-11-25 DIAGNOSIS — S0083XA Contusion of other part of head, initial encounter: Secondary | ICD-10-CM

## 2021-11-25 DIAGNOSIS — S52501A Unspecified fracture of the lower end of right radius, initial encounter for closed fracture: Secondary | ICD-10-CM

## 2021-11-25 DIAGNOSIS — I10 Essential (primary) hypertension: Secondary | ICD-10-CM | POA: Diagnosis not present

## 2021-11-25 DIAGNOSIS — S0181XA Laceration without foreign body of other part of head, initial encounter: Secondary | ICD-10-CM

## 2021-11-25 DIAGNOSIS — W19XXXA Unspecified fall, initial encounter: Secondary | ICD-10-CM | POA: Diagnosis present

## 2021-11-25 DIAGNOSIS — S40211A Abrasion of right shoulder, initial encounter: Secondary | ICD-10-CM

## 2021-11-25 DIAGNOSIS — S52551A Other extraarticular fracture of lower end of right radius, initial encounter for closed fracture: Secondary | ICD-10-CM | POA: Diagnosis not present

## 2021-11-25 DIAGNOSIS — I5032 Chronic diastolic (congestive) heart failure: Secondary | ICD-10-CM

## 2021-11-25 DIAGNOSIS — S5291XA Unspecified fracture of right forearm, initial encounter for closed fracture: Secondary | ICD-10-CM

## 2021-11-25 LAB — BASIC METABOLIC PANEL
Anion gap: 5 (ref 5–15)
BUN: 15 mg/dL (ref 8–23)
CO2: 30 mmol/L (ref 22–32)
Calcium: 8.9 mg/dL (ref 8.9–10.3)
Chloride: 101 mmol/L (ref 98–111)
Creatinine, Ser: 0.69 mg/dL (ref 0.44–1.00)
GFR, Estimated: >60 mL/min (ref >60)
Glucose, Bld: 137 mg/dL — ABNORMAL HIGH (ref 70–99)
Potassium: 4.3 mmol/L (ref 3.5–5.1)
Sodium: 136 mmol/L (ref 135–145)

## 2021-11-25 LAB — CBC
HCT: 45.1 % (ref 36.0–46.0)
Hemoglobin: 14.2 g/dL (ref 12.0–15.0)
MCH: 30.7 pg (ref 26.0–34.0)
MCHC: 31.5 g/dL (ref 30.0–36.0)
MCV: 97.6 fL (ref 80.0–100.0)
Platelets: 428 10*3/uL — ABNORMAL HIGH (ref 150–400)
RBC: 4.62 MIL/uL (ref 3.87–5.11)
RDW: 14.6 % (ref 11.5–15.5)
WBC: 12.7 10*3/uL — ABNORMAL HIGH (ref 4.0–10.5)
nRBC: 0 % (ref 0.0–0.2)

## 2021-11-25 MED ORDER — MORPHINE SULFATE (PF) 2 MG/ML IV SOLN
1.0000 mg | INTRAVENOUS | Status: DC | PRN
  Administered 2021-11-25: 1 mg via INTRAVENOUS
  Filled 2021-11-25: qty 1

## 2021-11-25 MED ORDER — FUROSEMIDE 20 MG PO TABS
20.0000 mg | ORAL_TABLET | ORAL | Status: DC
Start: 2021-11-25 — End: 2021-11-25
  Administered 2021-11-25: 20 mg via ORAL
  Filled 2021-11-25: qty 1

## 2021-11-25 MED ORDER — HYDROCODONE-ACETAMINOPHEN 5-325 MG PO TABS
1.0000 | ORAL_TABLET | Freq: Four times a day (QID) | ORAL | Status: DC | PRN
  Administered 2021-11-25 – 2021-11-28 (×6): 1 via ORAL
  Filled 2021-11-25 (×6): qty 1

## 2021-11-25 MED ORDER — METOPROLOL TARTRATE 50 MG PO TABS
50.0000 mg | ORAL_TABLET | Freq: Two times a day (BID) | ORAL | Status: DC
  Administered 2021-11-25 – 2021-11-28 (×8): 50 mg via ORAL
  Filled 2021-11-25 (×8): qty 1

## 2021-11-25 MED ORDER — LORAZEPAM 0.5 MG PO TABS
0.5000 mg | ORAL_TABLET | Freq: Two times a day (BID) | ORAL | Status: DC | PRN
Start: 2021-11-25 — End: 2021-11-28
  Administered 2021-11-25 – 2021-11-28 (×6): 0.5 mg via ORAL
  Filled 2021-11-25 (×6): qty 1

## 2021-11-25 MED ORDER — PANTOPRAZOLE SODIUM 40 MG IV SOLR
40.0000 mg | Freq: Once | INTRAVENOUS | Status: AC
Start: 2021-11-25 — End: 2021-11-25
  Administered 2021-11-25: 40 mg via INTRAVENOUS
  Filled 2021-11-25: qty 10

## 2021-11-25 MED ORDER — POTASSIUM CHLORIDE 10 MEQ/100ML IV SOLN
10.0000 meq | INTRAVENOUS | Status: AC
  Filled 2021-11-25: qty 100

## 2021-11-25 MED ORDER — ASPIRIN 81 MG PO TBEC
81.0000 mg | DELAYED_RELEASE_TABLET | Freq: Every day | ORAL | Status: DC
  Administered 2021-11-25 – 2021-11-28 (×4): 81 mg via ORAL
  Filled 2021-11-25 (×4): qty 1

## 2021-11-25 MED ORDER — POTASSIUM CHLORIDE CRYS ER 20 MEQ PO TBCR
40.0000 meq | EXTENDED_RELEASE_TABLET | Freq: Once | ORAL | Status: AC
  Administered 2021-11-25: 40 meq via ORAL
  Filled 2021-11-25: qty 2

## 2021-11-25 MED ORDER — PANTOPRAZOLE SODIUM 40 MG PO TBEC
40.0000 mg | DELAYED_RELEASE_TABLET | Freq: Every day | ORAL | Status: DC
  Administered 2021-11-26 – 2021-11-28 (×3): 40 mg via ORAL
  Filled 2021-11-25 (×3): qty 1

## 2021-11-25 NOTE — Assessment & Plan Note (Signed)
No visual changes

## 2021-11-25 NOTE — Progress Notes (Signed)
Orthopedic Tech Progress Note Patient Details:  [name] April 10, 1930 [id number]  Ortho Devices Type of Ortho Device: Volar splint Ortho Device/Splint Location: rue Ortho Device/Splint Interventions: Ordered, Application, Adjustment   Post Interventions Patient Tolerated: Well Instructions Provided: Care of device, Adjustment of device  [name] 11/25/2021, 2:32 AM

## 2021-11-25 NOTE — Assessment & Plan Note (Signed)
Elevated due to pain. Continue home Coreg.

## 2021-11-25 NOTE — ED Provider Notes (Signed)
12:17 AM The patient's right femur radiograph does not show an obvious fracture but she has a large, tender hematoma of the right thigh preventing her from moving her right lower extremity and precluding her from weightbearing.  She also has a fracture of the right distal radius.  We will place her right wrist in a splint and sling.  We will have her admitted to the hospitalist service as she is not currently able to care for herself.    12:25 AM Hospitalist to admit.   Amilio Zehnder, Jenny Reichmann, MD 11/25/21 Laureen Abrahams

## 2021-11-25 NOTE — Assessment & Plan Note (Signed)
Appears euvolemic - Continue metoprolol 

## 2021-11-25 NOTE — Assessment & Plan Note (Signed)
Appears to be mechanical.  Patient has had numerous falls in the past due to noncompliance with her walker. -She will need PT eval and likely need acute rehab since she lives alone at independent living facility

## 2021-11-25 NOTE — H&P (Signed)
History and Physical    Patient: [name] XNA:[id number] DOB: 1929/06/30 DOA: 11/24/2021 DOS: the patient was seen and examined on 11/25/2021 PCP: Colon Branch, MD  Patient coming from: ALF/ILF  Chief Complaint:  Chief Complaint  Patient presents with   Fall   HPI: [name] is a 88 y.o. female with medical history significant of chronic diastolic heart failure, hypertension, history of PE/DVT, CVA, CAD, breast cancer s/p left lumpectomy who presents s/p fall.  Patient reportedly was walking across the parking lot in her independent living facility and made a turn losing her balance and fell face first.  She is supposed to ambulate with a walker but does not like to use it.  Reports 4 falls in the past 2 years but grandson says it has been much more than that since she refuses to use a walker. She denies any dizziness or lightheadedness.  No chest pain or palpitation.  States she was otherwise in her normal state of health.  Right now currently having pain to her right eye and her right anterior thigh.  In the ED, she was afebrile, hypertensive up to SBP of 160 on room air.  Had mild leukocytosis of 11.7.  Hypokalemia of 3.  Normal creatinine.   CT head, cervical spine was negative.  CT maxillofacial was negative for any fracture but showed right supraorbital hematoma.  Right shoulder and femur x-ray were negative for fracture.  Bilateral pelvis x-ray was negative for any hip fracture.  Right wrist x-ray was revealing for right impacted extra articular fracture of the distal radius.    Patient continues to have pain and difficulty with moving her extremities so hospitalist was called for further management. Review of Systems: As mentioned in the history of present illness. All other systems reviewed and are negative. Past Medical History:  Diagnosis Date   CAD (coronary artery disease)    mild nonobstructive disease on cath in 2003   Cancer [facility])    Cardiomyopathy     Probable Takotsubo, severe CP w/ normal cath in 1994. Severe CP in 2003 w/ widespread T wave inversions on ECG. Cath w/ minimal coronary disease but LV-gram showed periapical severe hypokinesis and basilar hyperkinesia (EF 40%). Last echo in 4/09 confirmed full LV functional recovery with EF 60%, no regional wall motion abnormalities, mild to moderate LVH.   Chronic diastolic CHF (congestive heart failure) ([place]) 09/13/2019   CVA (cerebral infarction)    Depression with anxiety 03/29/2011   lost husband 3'09   DVT (deep venous thrombosis) ([place])    after venous ablation, R leg   Fracture 09/10/2007   L2, status post vertebroplasty of L2 performed by IR   GERD (gastroesophageal reflux disease)    Hemorrhoids    Hiatal hernia 03/29/2011   no nerve problems   Hyperlipidemia    Hyperplastic colon polyp 06/2001   Hypertension    Iron deficiency anemia due to chronic blood loss 12/30/2017   OA (osteoarthritis) of knee 03/29/2011   w/ bilateral knee pain-not a problem now   OSA (obstructive sleep apnea) 03/29/2011   no cpap used, not a problem now.   Osteoporosis    Otalgia of both ears    Dr. Simeon Craft   Polycythemia    Stroke [facility]) 03/29/2011   CVA x2 -last 10'12-?TIA(visual problems)   Varicose vein    Past Surgical History:  Procedure Laterality Date   APPENDECTOMY     BACK SURGERY     BREAST BIOPSY  BREAST CYST EXCISION     BREAST LUMPECTOMY Left 2013   left stage I left breast cancer   CHOLECYSTECTOMY  04/09/2011   Procedure: LAPAROSCOPIC CHOLECYSTECTOMY WITH INTRAOPERATIVE CHOLANGIOGRAM;  Surgeon: Odis Hollingshead, MD;  Location: [facility];  Service: General;  Laterality: N/A;   Dental Extraction     L maxillary molar   INTRAMEDULLARY (IM) NAIL INTERTROCHANTERIC Left 12/25/2019   Procedure: INTRAMEDULLARY (IM) NAIL INTERTROCHANTRIC;  Surgeon: Paralee Cancel, MD;  Location: [facility];  Service: Orthopedics;  Laterality: Left;   IR KYPHO THORACIC WITH BONE BIOPSY  09/15/2019   IR  KYPHO THORACIC WITH BONE BIOPSY  07/13/2020       IR KYPHO THORACIC WITH BONE BIOPSY  07/13/2020   IR VERTEBROPLASTY CERV/THOR BX INC UNI/BIL INC/INJECT/IMAGING  11/10/2019   KYPHOPLASTY N/A 11/16/2020   Procedure: T11,  L1,KYPHOPLASTY;  Surgeon: Melina Schools, MD;  Location: [facility];  Service: Orthopedics;  Laterality: N/A;   KYPHOSIS SURGERY  08/2007   cement used   OVARIAN CYST SURGERY     left   RADIOLOGY WITH ANESTHESIA N/A 11/10/2019   Procedure: VERTEBROPLASTY;  Surgeon: Luanne Bras, MD;  Location: [place];  Service: Radiology;  Laterality: N/A;   TONSILLECTOMY     TOTAL KNEE ARTHROPLASTY  06/2010   left   TUBAL LIGATION     Social History:  reports that she has been smoking cigarettes. She started smoking about 70 years ago. She has a 37.00 pack-year smoking history. She has never used smokeless tobacco. She reports that she does not currently use alcohol after a past usage of about 1.0 standard drink of alcohol per week. She reports that she does not use drugs.  Allergies  Allergen Reactions   Penicillins Hives, Rash and Other (See Comments)    Has patient had a PCN reaction causing immediate rash, facial/tongue/throat swelling, SOB or lightheadedness with hypotension: Yes Has patient had a PCN reaction causing severe rash involving mucus membranes or skin necrosis: yes Has patient had a PCN reaction that required hospitalization: No Has patient had a PCN reaction occurring within the last 10 years: no If all of the above answers are "NO", then may proceed with Cephalosporin use.  Other Reaction: OTHER REACTION   Valsartan Itching and Other (See Comments)   Atorvastatin Diarrhea   Carvedilol Other (See Comments)    "teeth hurt when I took it"    Ezetimibe Nausea Only   Fluvastatin Sodium Other (See Comments)    "Can't remember"   Magnesium Hydroxide Other (See Comments)    REACTION: triggers HAs   Meloxicam Other (See Comments)    Pt seeing auro's / spots.     Pneumovax  [Pneumococcal Polysaccharide Vaccine] Other (See Comments)    Local reaction   Quinapril Hcl Other (See Comments)     "feelings of tiredness"   Simvastatin Diarrhea   Topamax [Topiramate] Itching   Vit D-Vit E-Safflower Oil Other (See Comments)    Headaches    Family History  Problem Relation Age of Onset   Breast cancer Mother    Heart disease Father        MIs   Colon cancer Other        cousin   Breast cancer Sister    Leukemia Brother        GM    Prior to Admission medications   Medication Sig Start Date End Date Taking? Authorizing Provider  alendronate (FOSAMAX) 70 MG tablet TAKE 1 TABLET(70 MG) BY MOUTH EVERY 7  DAYS WITH A FULL GLASS OF WATER AND ON AN EMPTY STOMACH Patient taking differently: Take 70 mg by mouth every Sunday. 08/09/21  Yes Colon Branch, MD  aspirin EC 81 MG tablet Take 81 mg by mouth daily. Swallow whole.   Yes [provider]  furosemide (LASIX) 20 MG tablet Take 1 tablet (20 mg total) by mouth every other day. 03/09/21  Yes Richardo Priest, MD  LORazepam (ATIVAN) 0.5 MG tablet Take 1 tablet (0.5 mg total) by mouth 2 (two) times daily as needed for anxiety. 11/06/21  Yes Paz, Alda Berthold, MD  metoprolol tartrate (LOPRESSOR) 50 MG tablet TAKE 1 TABLET(50 MG) BY MOUTH TWICE DAILY Patient taking differently: Take 50 mg by mouth 2 (two) times daily. 08/08/21  Yes Paz, Alda Berthold, MD  nitroGLYCERIN (NITROSTAT) 0.4 MG SL tablet Place 1 tablet (0.4 mg total) under the tongue every 5 (five) minutes x 3 doses as needed for chest pain. 07/25/20  Yes Paz, Alda Berthold, MD  omeprazole (PRILOSEC) 20 MG capsule Take 1 capsule (20 mg total) by mouth daily. Patient not taking: Reported on 11/24/2021 10/05/19   Wille Celeste, PA-C  traMADol (ULTRAM) 50 MG tablet Take 1 tablet (50 mg total) by mouth every 12 (twelve) hours as needed for severe pain. Patient not taking: Reported on 11/24/2021 10/09/21   Mariel Aloe, MD  valACYclovir (VALTREX) 500 MG tablet TAKE 2 TABLETS BY MOUTH AT  ONSET OF RASH AND 2 TABLETS 12 HOURS LATER AS NEEDED Patient not taking: Reported on 11/24/2021 02/17/21 02/17/22  Volanda Napoleon, MD    Physical Exam: Vitals:   11/24/21 2100 11/24/21 2200 11/24/21 2300 11/25/21 0000  BP: (!) 171/85 (!) 188/97 (!) 164/95 (!) 162/82  Pulse: 72 77 79 73  Resp: '20 16 16 16  '$ Temp:      TempSrc:      SpO2: 94% (!) 89% 96% 98%  Weight:      Height:       Constitutional: NAD, calm, comfortable, elderly female laying flat in bed with supraorbital hematoma, ecchymosis of the anterior nasal bridge, sutured chin laceration with minimal bloody drainage. Eyes: PERRLA, lids and conjunctivae normal ENMT: Mucous membranes are moist. .  Neck: normal, supple Respiratory: clear to auscultation bilaterally, no wheezing, no crackles. Normal respiratory effort. No accessory muscle use.  Cardiovascular: Regular rate and rhythm, no murmurs / rubs / gallops. No extremity edema.  Abdomen: Soft, nondistended, nontender.  Bowel sounds positive.  Musculoskeletal: no clubbing / cyanosis. No joint deformity upper and lower extremities Skin: Right shoulder covered in clean bandages.  Grandson at bedside able to show photo of multiple large areas of abrasion with sloughing of skin to her lateral humerus and scapular region. Neurologic: CN 2-12 grossly intact.  4-4 strength of left lower extremity.  Patient could not lift right lower extremity due to intolerance to pain on anterior thigh.   Psychiatric:  Alert and oriented x 3.  Frustrated mood at times initially refused her complete exam. Data Reviewed:  See HPI  Assessment and Plan: * Fall Appears to be mechanical.  Patient has had numerous falls in the past due to noncompliance with her walker. -She will need PT eval and likely need acute rehab since she lives alone at independent living facility  Essential hypertension Elevated due to pain. Continue home Coreg.  Chronic diastolic CHF (congestive heart failure)  (HCC) Appears euvolemic.  Continue metoprolol.  Supraorbital ecchymosis No visual changes  Shoulder abrasion, right, initial  encounter - Has several large areas of abrasion with sloughing of skin to right shoulder -Wound care per RN  Chin laceration In the setting of mechanical fall Repaired in the ED -Wound care per RN  Closed right radial fracture Place in wrist splint. Will need to touch base with orthopedic tomorrow for follow up. -Patient unfortunately is right-handed      Advance Care Planning:   Code Status: Full Code   Consults: NONE  Family Communication: Discussed with grandson at bedside  Severity of Illness: The appropriate patient status for this patient is OBSERVATION. Observation status is judged to be reasonable and necessary in order to provide the required intensity of service to ensure the patient's safety. The patient's presenting symptoms, physical exam findings, and initial radiographic and laboratory data in the context of their medical condition is felt to place them at decreased risk for further clinical deterioration. Furthermore, it is anticipated that the patient will be medically stable for discharge from the hospital within 2 midnights of admission.   Author: Orene Desanctis, DO 11/25/2021 1:31 AM  For on call review [email or web address].

## 2021-11-25 NOTE — Assessment & Plan Note (Signed)
-   Has several large areas of abrasion with sloughing of skin to right shoulder -Wound care per RN

## 2021-11-25 NOTE — Progress Notes (Signed)
PROGRESS NOTE  [name]  DOB: Oct 26, 1929  PCP: Colon Branch, MD KDT:[id number]  DOA: 11/24/2021  LOS: 0 days  Hospital Day: 2  Brief narrative: [name] is a 88 y.o. female with PMH significant for HTN, HLD, CAD, chronic diastolic CHF, CVA, DVT/PE, hiatal hernia, iron deficiency anemia, osteoarthritis, polycythemia, breast cancer s/p left lumpectomy patient presented to the ED from the [facility] ALF after a fall. Patient apparently was walking outside, lost her balance while trying to make a turn and fell forward and hit her head on the floor sustaining a hematoma in her right forehead, skin tear on the right shoulder and laceration on the bottom of the chin, also complained of right wrist pain, no LOC. Patient has gait impairment at baseline and has had 4 falls in the last 2 years.  She is supposed to ambulate with a walker but she does not like to use it.    In the ED, patient was afebrile, blood pressure was elevated to 170s. Labs with potassium low at 3, calcium level low at 6.8, creatinine normal CT head, cervical spine was negative. CT maxillofacial was negative for any fracture but showed right supraorbital hematoma. Right shoulder and femur x-ray were negative for fracture.  Bilateral pelvis x-ray was negative for any hip fracture. Right hip x-ray did not show any fractures but showed osteopenia and degenerative change. Right wrist x-ray was revealing for right impacted extra articular fracture of the distal radius.   Admitted to hospitalist service.  Subjective: Patient was seen and examined this morning.  Pleasant elderly Caucasian female.  Lying on bed.  Not in distress.  Has ecchymosis on the left forehead and periorbital area.  Left wrist has splint on. Pain controlled. Chart reviewed.  Assessment and plan: Right wrist fracture -X-ray showed right impacted extra-articular fracture of the distal radius -Placed on wrist splint. -I requested orthopedics  consult to [facility] today -Continue pain control  Supraorbital ecchymosis -No visual changes   Shoulder abrasion, right, initial encounter -Has several large areas of abrasion with sloughing of skin to right shoulder -Wound care per RN   Chin laceration -Repaired in the ED -Wound care per RN  Frequent falls -Patient has gait impairment at baseline and has had 4 falls in the last 2 years.  She is supposed to ambulate with a walker but she does not like to use it.  She lost her balance while making a turn and fell leading to injuries and this hospitalization  -PT eval ordered.     Chronic diastolic CHF  essential hypertension -PTA on metoprolol tartrate 50 mg twice daily, Lasix 20 mg every other day -Remains euvolemic  -Continue metoprolol.  Keep Lasix on hold for now.  CAD/stroke/HLD -Continue aspirin.  Not on statin apparently because of diarrhea as a side effect  Osteoporosis/osteopenia -On Fosamax 70 mg every Sunday    Goals of care   Code Status: Full Code    Mobility: Encourage ambulation with PT  Skin assessment:     Nutritional status:  Body mass index is 18.79 kg/m.          Diet:  Diet Order             Diet Heart Room service appropriate? Yes; Fluid consistency: Thin  Diet effective now                   DVT prophylaxis:  SCDs Start: 11/25/21 0115   Antimicrobials: None Fluid: None Consultants: Orthopedics  Family Communication: None at bedside  Status is: Observation  Continue in-hospital care because: Pending orthopedics evaluation, pending PT evaluation Level of care: Med-Surg   Dispo: The [facility] is [facility]              Anticipated d/c is to: Pending clinical course and PT eval              Patient currently is not medically stable to d/c.   Difficult to place patient No     Infusions:    Scheduled Meds:  aspirin EC  81 mg Oral Daily   metoprolol tartrate  50 mg Oral BID   [START ON 11/26/2021]  pantoprazole  40 mg Oral Daily    PRN meds: HYDROcodone-acetaminophen, LORazepam, morphine injection   Antimicrobials: Anti-infectives (From admission, onward)    None       Objective: Vitals:   11/25/21 0604 11/25/21 1052  BP: 118/64 (!) 125/54  Pulse: 61 (!) 57  Resp: 14 16  Temp: 97.8 F (36.6 C) 98.1 F (36.7 C)  SpO2: 93% 99%    Intake/Output Summary (Last 24 hours) at 11/25/2021 1358 Last data filed at 11/25/2021 0981 Gross per 24 hour  Intake 266.81 ml  Output 100 ml  Net 166.81 ml   Filed Weights   11/24/21 1957 11/25/21 0155  Weight: 57.6 kg 52.8 kg   Weight change:  Body mass index is 18.79 kg/m.   Physical Exam: General exam: Pleasant, elderly Caucasian female.  Pain controlled Skin: No rashes, lesions or ulcers. HEENT: Atraumatic, normocephalic, no obvious bleeding.  Ecchymosis present on right forehead and periorbital area Lungs: Clear to auscultation bilaterally CVS: Regular rate and rhythm, no murmur GI/Abd soft, nontender, nondistended, bowel sound present CNS: Alert, awake, oriented x3 Psychiatry: Mood appropriate Extremities: No pedal edema, no calf tenderness.  Right wrist in a splint  Data Review: I have personally reviewed the laboratory data and studies available.  F/u labs ordered Unresulted Labs (From admission, onward)     Start     Ordered   Unscheduled  CBC with Differential/Platelet  Tomorrow morning,   R        11/25/21 1358            Signed, Terrilee Croak, MD [facility] 11/25/2021

## 2021-11-25 NOTE — Assessment & Plan Note (Signed)
In the setting of mechanical fall Repaired in the ED -Wound care per RN

## 2021-11-25 NOTE — Progress Notes (Signed)
PT Cancellation Note  Patient Details Name: [name] MRN: [id number] DOB: December 19, 1929   Cancelled Treatment:    Reason Eval/Treat Not Completed: Patient declined, no reason specified Pt declined to participate at this time.  Will check back as schedule permits.   Kati L Payson 11/25/2021, 11:09 AM Arlyce Dice, DPT Physical Therapist Acute Rehabilitation Services Preferred contact method: Secure Chat Weekend Pager Only: [phone] Office: [phone]

## 2021-11-25 NOTE — ED Notes (Signed)
ED TO INPATIENT HANDOFF REPORT  Name/Age/Gender [name] 88 y.o. female  Code Status Code Status History     Date Active Date Inactive Code Status Order ID Comments User Context   10/07/2021 1803 10/10/2021 1623 Full Code [id number]  Orma Flaming, MD ED   12/25/2020 1600 12/26/2020 1913 DNR [id number]  Karmen Bongo, MD ED   11/16/2020 1638 11/17/2020 2236 Full Code [id number]  Charlyne Petrin, PA-C Inpatient   10/06/2020 1141 10/06/2020 1923 DNR [id number]  Florencia Reasons, MD Inpatient   10/03/2020 2315 10/06/2020 1140 Full Code [id number]  Chotiner, Yevonne Aline, MD ED   12/24/2019 1621 12/28/2019 2227 Full Code [id number]  Charlynne Cousins, MD Inpatient   11/04/2019 1759 11/12/2019 2207 Full Code [id number]  Marshell Garfinkel, MD ED   09/13/2019 0816 09/17/2019 2300 Full Code [id number]  Vernelle Emerald, MD Inpatient   04/09/2011 1410 04/10/2011 1552 Full Code [id number]  Boykin Nearing, RN Inpatient       Home/SNF/Other [facility]  Chief Complaint Fall [W19.XXXA]  Level of Care/Admitting Diagnosis ED Disposition     ED Disposition  Admit   Condition  --   Comment  Hospital Area: [facility] [[id number]]  Level of Care: Med-Surg [16]  May place patient in observation at [facility] or [place] if equivalent level of care is available:: No  Covid Evaluation: Asymptomatic - no recent exposure (last 10 days) testing not required  Diagnosis: Fall [[id number]]  Admitting Physician: Orene Desanctis [[id number]]  Attending Physician: Orene Desanctis [[id number]]          Medical History Past Medical History:  Diagnosis Date   CAD (coronary artery disease)    mild nonobstructive disease on cath in 2003   Cancer [facility])    Cardiomyopathy    Probable Takotsubo, severe CP w/ normal cath in 1994. Severe CP in 2003 w/ widespread T wave inversions on ECG. Cath w/ minimal coronary disease but LV-gram showed periapical severe hypokinesis and basilar hyperkinesia (EF 40%). Last echo in 4/09  confirmed full LV functional recovery with EF 60%, no regional wall motion abnormalities, mild to moderate LVH.   Chronic diastolic CHF (congestive heart failure) ([place]) 09/13/2019   CVA (cerebral infarction)    Depression with anxiety 03/29/2011   lost husband 3'09   DVT (deep venous thrombosis) ([place])    after venous ablation, R leg   Fracture 09/10/2007   L2, status post vertebroplasty of L2 performed by IR   GERD (gastroesophageal reflux disease)    Hemorrhoids    Hiatal hernia 03/29/2011   no nerve problems   Hyperlipidemia    Hyperplastic colon polyp 06/2001   Hypertension    Iron deficiency anemia due to chronic blood loss 12/30/2017   OA (osteoarthritis) of knee 03/29/2011   w/ bilateral knee pain-not a problem now   OSA (obstructive sleep apnea) 03/29/2011   no cpap used, not a problem now.   Osteoporosis    Otalgia of both ears    Dr. Simeon Craft   Polycythemia    Stroke [facility]) 03/29/2011   CVA x2 -last 10'12-?TIA(visual problems)   Varicose vein     Allergies Allergies  Allergen Reactions   Penicillins Hives, Rash and Other (See Comments)    Has patient had a PCN reaction causing immediate rash, facial/tongue/throat swelling, SOB or lightheadedness with hypotension: Yes Has patient had a PCN reaction causing severe rash involving mucus membranes or skin necrosis: yes Has patient had a PCN reaction that required  hospitalization: No Has patient had a PCN reaction occurring within the last 10 years: no If all of the above answers are "NO", then may proceed with Cephalosporin use.  Other Reaction: OTHER REACTION   Valsartan Itching and Other (See Comments)   Atorvastatin Diarrhea   Carvedilol Other (See Comments)    "teeth hurt when I took it"    Ezetimibe Nausea Only   Fluvastatin Sodium Other (See Comments)    "Can't remember"   Magnesium Hydroxide Other (See Comments)    REACTION: triggers HAs   Meloxicam Other (See Comments)    Pt seeing auro's / spots.      Pneumovax [Pneumococcal Polysaccharide Vaccine] Other (See Comments)    Local reaction   Quinapril Hcl Other (See Comments)     "feelings of tiredness"   Simvastatin Diarrhea   Topamax [Topiramate] Itching   Vit D-Vit E-Safflower Oil Other (See Comments)    Headaches    IV Location/Drains/Wounds Patient Lines/Drains/Airways Status     Active Line/Drains/Airways     Name Placement date Placement time Site Days   Peripheral IV 11/24/21 20 G Anterior;Left Forearm 11/24/21  2156  Forearm  1            Labs/Imaging Results for orders placed or performed during the hospital encounter of 11/24/21 (from the past 48 hour(s))  CBC with Differential/Platelet     Status: Abnormal   Collection Time: 11/24/21 11:38 PM  Result Value Ref Range   WBC 11.7 (H) 4.0 - 10.5 K/uL   RBC 4.37 3.87 - 5.11 MIL/uL   Hemoglobin 13.5 12.0 - 15.0 g/dL   HCT 42.2 36.0 - 46.0 %   MCV 96.6 80.0 - 100.0 fL   MCH 30.9 26.0 - 34.0 pg   MCHC 32.0 30.0 - 36.0 g/dL   RDW 14.6 11.5 - 15.5 %   Platelets 391 150 - 400 K/uL   nRBC 0.0 0.0 - 0.2 %   Neutrophils Relative % 86 %   Neutro Abs 10.1 (H) 1.7 - 7.7 K/uL   Lymphocytes Relative 4 %   Lymphs Abs 0.5 (L) 0.7 - 4.0 K/uL   Monocytes Relative 8 %   Monocytes Absolute 0.9 0.1 - 1.0 K/uL   Eosinophils Relative 1 %   Eosinophils Absolute 0.1 0.0 - 0.5 K/uL   Basophils Relative 1 %   Basophils Absolute 0.1 0.0 - 0.1 K/uL   Immature Granulocytes 0 %   Abs Immature Granulocytes 0.04 0.00 - 0.07 K/uL    Comment: Performed at [facility], [place] [address] [place], [place] [address]  Basic metabolic panel     Status: Abnormal   Collection Time: 11/24/21 11:38 PM  Result Value Ref Range   Sodium 138 135 - 145 mmol/L   Potassium 3.0 (L) 3.5 - 5.1 mmol/L   Chloride 110 98 - 111 mmol/L   CO2 23 22 - 32 mmol/L   Glucose, Bld 119 (H) 70 - 99 mg/dL    Comment: Glucose reference range applies only to samples taken after fasting for at least 8  hours.   BUN 12 8 - 23 mg/dL   Creatinine, Ser 0.36 (L) 0.44 - 1.00 mg/dL   Calcium 6.8 (L) 8.9 - 10.3 mg/dL   GFR, Estimated >60 >60 mL/min    Comment: (NOTE) Calculated using the CKD-EPI Creatinine Equation (2021)    Anion gap 5 5 - 15    Comment: Performed at [facility], [place] [address] [place], [place] [address]   *  Note: Due to a large number of results and/or encounters for the requested time period, some results have not been displayed. A complete set of results can be found in Results Review.   DG Femur Portable 1 View Right  Result Date: 11/25/2021 CLINICAL DATA:  [place] earlier today with continued right lower extremity pain. EXAM: RIGHT FEMUR PORTABLE 1 VIEW COMPARISON:  AP pelvis and right hip views earlier today. FINDINGS: Osteopenia. There is no evidence of fractures. There is mild nonerosive femorotibial arthrosis. There is patchy calcification in the femoral artery. IMPRESSION: Osteopenia and degenerative change without evidence of fractures. Electronically Signed   By: Telford Nab M.D.   On: 11/25/2021 00:10   DG Hip Unilat W or Wo Pelvis 2-3 Views Right  Result Date: 11/24/2021 CLINICAL DATA:  Fall, right hip pain EXAM: DG HIP (WITH OR WITHOUT PELVIS) 2-3V RIGHT COMPARISON:  None Available. FINDINGS: Left hip ORIF has been performed. Normal alignment. No acute fracture or dislocation. Mild bilateral hip degenerative arthritis noted with joint space narrowing. Vascular calcifications are seen within the medial thighs bilaterally. IMPRESSION: No acute fracture or dislocation. Electronically Signed   By: Fidela Salisbury M.D.   On: 11/24/2021 23:01   CT Head Wo Contrast  Result Date: 11/24/2021 CLINICAL DATA:  Trauma. EXAM: CT HEAD WITHOUT CONTRAST CT MAXILLOFACIAL WITHOUT CONTRAST CT CERVICAL SPINE WITHOUT CONTRAST TECHNIQUE: Multidetector CT imaging of the head, cervical spine, and maxillofacial structures were performed using the standard protocol without  intravenous contrast. Multiplanar CT image reconstructions of the cervical spine and maxillofacial structures were also generated. RADIATION DOSE REDUCTION: This exam was performed according to the departmental dose-optimization program which includes automated exposure control, adjustment of the mA and/or kV according to patient size and/or use of iterative reconstruction technique. COMPARISON:  Brain MRI dated 10/08/2018 and CT dated 10/07/2021. FINDINGS: CT HEAD FINDINGS Brain: Moderate age-related atrophy and chronic microvascular ischemic changes. Old right thalamic lacunar infarct. There is no acute intracranial hemorrhage. No mass effect or midline shift. No extra-axial fluid collection. Vascular: No hyperdense vessel or unexpected calcification. Skull: Normal. Negative for fracture or focal lesion. Other: Small right forehead hematoma. CT MAXILLOFACIAL FINDINGS Osseous: No acute fracture or subluxation. Orbits: Negative. No traumatic or inflammatory finding. Sinuses: Clear. Soft tissues: There is soft tissue swelling and thickening of the tip of the nose. Right supraorbital hematoma. CT CERVICAL SPINE FINDINGS Alignment: No acute subluxation. Skull base and vertebrae: No acute fracture.  Osteopenia. Soft tissues and spinal canal: No prevertebral fluid or swelling. No visible canal hematoma. Disc levels:  No acute findings.  Mild degenerative changes. Upper chest: Negative. Other: Bilateral carotid bulb calcified plaques. IMPRESSION: 1. No acute intracranial pathology. Moderate age-related atrophy and chronic microvascular ischemic changes. 2. No acute facial bone fractures. 3. No acute cervical spine pathology. Electronically Signed   By: Anner Crete M.D.   On: 11/24/2021 21:40   CT Maxillofacial Wo Contrast  Result Date: 11/24/2021 CLINICAL DATA:  Trauma. EXAM: CT HEAD WITHOUT CONTRAST CT MAXILLOFACIAL WITHOUT CONTRAST CT CERVICAL SPINE WITHOUT CONTRAST TECHNIQUE: Multidetector CT imaging of the  head, cervical spine, and maxillofacial structures were performed using the standard protocol without intravenous contrast. Multiplanar CT image reconstructions of the cervical spine and maxillofacial structures were also generated. RADIATION DOSE REDUCTION: This exam was performed according to the departmental dose-optimization program which includes automated exposure control, adjustment of the mA and/or kV according to patient size and/or use of iterative reconstruction technique. COMPARISON:  Brain MRI dated 10/08/2018 and CT  dated 10/07/2021. FINDINGS: CT HEAD FINDINGS Brain: Moderate age-related atrophy and chronic microvascular ischemic changes. Old right thalamic lacunar infarct. There is no acute intracranial hemorrhage. No mass effect or midline shift. No extra-axial fluid collection. Vascular: No hyperdense vessel or unexpected calcification. Skull: Normal. Negative for fracture or focal lesion. Other: Small right forehead hematoma. CT MAXILLOFACIAL FINDINGS Osseous: No acute fracture or subluxation. Orbits: Negative. No traumatic or inflammatory finding. Sinuses: Clear. Soft tissues: There is soft tissue swelling and thickening of the tip of the nose. Right supraorbital hematoma. CT CERVICAL SPINE FINDINGS Alignment: No acute subluxation. Skull base and vertebrae: No acute fracture.  Osteopenia. Soft tissues and spinal canal: No prevertebral fluid or swelling. No visible canal hematoma. Disc levels:  No acute findings.  Mild degenerative changes. Upper chest: Negative. Other: Bilateral carotid bulb calcified plaques. IMPRESSION: 1. No acute intracranial pathology. Moderate age-related atrophy and chronic microvascular ischemic changes. 2. No acute facial bone fractures. 3. No acute cervical spine pathology. Electronically Signed   By: Anner Crete M.D.   On: 11/24/2021 21:40   CT Cervical Spine Wo Contrast  Result Date: 11/24/2021 CLINICAL DATA:  Trauma. EXAM: CT HEAD WITHOUT CONTRAST CT  MAXILLOFACIAL WITHOUT CONTRAST CT CERVICAL SPINE WITHOUT CONTRAST TECHNIQUE: Multidetector CT imaging of the head, cervical spine, and maxillofacial structures were performed using the standard protocol without intravenous contrast. Multiplanar CT image reconstructions of the cervical spine and maxillofacial structures were also generated. RADIATION DOSE REDUCTION: This exam was performed according to the departmental dose-optimization program which includes automated exposure control, adjustment of the mA and/or kV according to patient size and/or use of iterative reconstruction technique. COMPARISON:  Brain MRI dated 10/08/2018 and CT dated 10/07/2021. FINDINGS: CT HEAD FINDINGS Brain: Moderate age-related atrophy and chronic microvascular ischemic changes. Old right thalamic lacunar infarct. There is no acute intracranial hemorrhage. No mass effect or midline shift. No extra-axial fluid collection. Vascular: No hyperdense vessel or unexpected calcification. Skull: Normal. Negative for fracture or focal lesion. Other: Small right forehead hematoma. CT MAXILLOFACIAL FINDINGS Osseous: No acute fracture or subluxation. Orbits: Negative. No traumatic or inflammatory finding. Sinuses: Clear. Soft tissues: There is soft tissue swelling and thickening of the tip of the nose. Right supraorbital hematoma. CT CERVICAL SPINE FINDINGS Alignment: No acute subluxation. Skull base and vertebrae: No acute fracture.  Osteopenia. Soft tissues and spinal canal: No prevertebral fluid or swelling. No visible canal hematoma. Disc levels:  No acute findings.  Mild degenerative changes. Upper chest: Negative. Other: Bilateral carotid bulb calcified plaques. IMPRESSION: 1. No acute intracranial pathology. Moderate age-related atrophy and chronic microvascular ischemic changes. 2. No acute facial bone fractures. 3. No acute cervical spine pathology. Electronically Signed   By: Anner Crete M.D.   On: 11/24/2021 21:40   DG Wrist  Complete Right  Result Date: 11/24/2021 CLINICAL DATA:  [place], right wrist pain EXAM: RIGHT WRIST - COMPLETE 3+ VIEW COMPARISON:  None Available. FINDINGS: Frontal, oblique, lateral, and ulnar deviated views of the right wrist are obtained. There is a minimally displaced and impacted extra-articular fracture of the distal right radius, best seen on the oblique projection. No other acute displaced fractures. Severe osteoarthritis within the radial aspect of the carpus and first carpometacarpal joint. Mild diffuse soft tissue swelling. IMPRESSION: 1. Minimally impacted extra-articular fracture of the distal right radius. 2. 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 Electronically Signed   By: Randa Ngo M.D.   On: 11/24/2021 21:37   DG Shoulder Right  Result Date:  11/24/2021 CLINICAL DATA:  [place], hit head, right shoulder laceration EXAM: RIGHT SHOULDER - 2+ VIEW COMPARISON:  None Available. FINDINGS: Internal rotation, external rotation, transscapular views are obtained. No acute fracture, subluxation, or dislocation. Moderate hypertrophic changes and spurring are seen at the acromioclavicular joint. There is mild glenohumeral joint space narrowing. Visualized portions of the right chest are clear. IMPRESSION: 1. Degenerative changes.  No acute fracture. Electronically Signed   By: Randa Ngo M.D.   On: 11/24/2021 21:20    Pending Labs Unresulted Labs (From admission, onward)    None       Vitals/Pain Today's Vitals   11/24/21 2200 11/24/21 2300 11/24/21 2359 11/25/21 0000  BP: (!) 188/97 (!) 164/95  (!) 162/82  Pulse: 77 79  73  Resp: '16 16  16  '$ Temp:      TempSrc:      SpO2: (!) 89% 96%  98%  Weight:      Height:      PainSc:   Asleep     Isolation Precautions No active isolations  Medications Medications  aspirin EC tablet 81 mg (has no administration in time range)  metoprolol tartrate (LOPRESSOR) tablet 50 mg (has no administration in time range)   furosemide (LASIX) tablet 20 mg (has no administration in time range)  LORazepam (ATIVAN) tablet 0.5 mg (has no administration in time range)  potassium chloride 10 mEq in 100 mL IVPB (has no administration in time range)  HYDROcodone-acetaminophen (NORCO/VICODIN) 5-325 MG per tablet 1 tablet (has no administration in time range)  morphine (PF) 2 MG/ML injection 1 mg (has no administration in time range)  HYDROcodone-acetaminophen (NORCO/VICODIN) 5-325 MG per tablet 1 tablet (1 tablet Oral Given 11/24/21 2032)  morphine (PF) 4 MG/ML injection 4 mg (4 mg Intravenous Given 11/24/21 2156)  ondansetron (ZOFRAN) injection 4 mg (4 mg Intravenous Given 11/24/21 2156)    Mobility walks with person assist

## 2021-11-25 NOTE — Assessment & Plan Note (Addendum)
Place in wrist splint. Will need to touch base with orthopedic tomorrow for follow up. -Patient unfortunately is right-handed

## 2021-11-26 DIAGNOSIS — S52501A Unspecified fracture of the lower end of right radius, initial encounter for closed fracture: Secondary | ICD-10-CM | POA: Diagnosis not present

## 2021-11-26 DIAGNOSIS — W19XXXA Unspecified fall, initial encounter: Secondary | ICD-10-CM | POA: Diagnosis not present

## 2021-11-26 DIAGNOSIS — I5032 Chronic diastolic (congestive) heart failure: Secondary | ICD-10-CM | POA: Diagnosis not present

## 2021-11-26 DIAGNOSIS — S52551A Other extraarticular fracture of lower end of right radius, initial encounter for closed fracture: Secondary | ICD-10-CM | POA: Diagnosis not present

## 2021-11-26 LAB — CBC WITH DIFFERENTIAL/PLATELET
Abs Immature Granulocytes: 0.03 10*3/uL (ref 0.00–0.07)
Basophils Absolute: 0.1 10*3/uL (ref 0.0–0.1)
Basophils Relative: 1 %
Eosinophils Absolute: 0.2 10*3/uL (ref 0.0–0.5)
Eosinophils Relative: 3 %
HCT: 41.3 % (ref 36.0–46.0)
Hemoglobin: 13.1 g/dL (ref 12.0–15.0)
Immature Granulocytes: 0 %
Lymphocytes Relative: 8 %
Lymphs Abs: 0.6 10*3/uL — ABNORMAL LOW (ref 0.7–4.0)
MCH: 30.8 pg (ref 26.0–34.0)
MCHC: 31.7 g/dL (ref 30.0–36.0)
MCV: 97.2 fL (ref 80.0–100.0)
Monocytes Absolute: 0.9 10*3/uL (ref 0.1–1.0)
Monocytes Relative: 12 %
Neutro Abs: 5.7 10*3/uL (ref 1.7–7.7)
Neutrophils Relative %: 76 %
Platelets: 373 10*3/uL (ref 150–400)
RBC: 4.25 MIL/uL (ref 3.87–5.11)
RDW: 14.7 % (ref 11.5–15.5)
WBC: 7.6 10*3/uL (ref 4.0–10.5)
nRBC: 0 % (ref 0.0–0.2)

## 2021-11-26 MED ORDER — ALENDRONATE SODIUM 70 MG PO TABS
70.0000 mg | ORAL_TABLET | ORAL | Status: DC
Start: 2021-11-26 — End: 2021-11-26

## 2021-11-26 NOTE — Progress Notes (Signed)
88 yo patient with right distal radius fracture, minimal displacement, plan nonsurgical management.  Splint and f/u in office 1 week after discharge.  Full consult to follow.   Johnny Bridge, MD

## 2021-11-26 NOTE — Progress Notes (Signed)
Patient asked for home dose of Fosamax 70 mg q Sunday to be ordered. Doctor placed order. Per pharmacy bisphosphonates are held during hospital admission, they discontinued order.

## 2021-11-26 NOTE — Evaluation (Signed)
Physical Therapy Evaluation Patient Details Name: [name] MRN: [id number] DOB: [birth year] Today's Date: 11/26/2021  History of Present Illness  Pt admitted from IND living facility s/p fall with R radial fx and multiple facial contusions, abrasions, lacerations.  Pt with hx of multiple prior falls, as well as PMHx including CAD, CVA, DVT, back surgery, L TKR , L hip fx s/p IM nailing, T11 kyphoplasty, L2 vertebroplasty and CHF  Clinical Impression  Pt admitted as above and presenting with functional mobility limitations 2* R wrist fx, R LE pain, generalized weakness, balance deficits and limited endurance.  This date, pt requires significant assist of 2 for safe performance of basic mobility tasks and would benefit from follow up rehab at SNF level to maximize IND and safety prior to return to prior living arrangement.     Recommendations for follow up therapy are one component of a multi-disciplinary discharge planning process, led by the attending physician.  Recommendations may be updated based on patient status, additional functional criteria and insurance authorization.  Follow Up Recommendations Skilled nursing-short term rehab (<3 hours/day) Can patient physically be transported by private vehicle: No    Assistance Recommended at Discharge Frequent or constant Supervision/Assistance  Patient can return home with the following  A lot of help with walking and/or transfers;A lot of help with bathing/dressing/bathroom;Assistance with cooking/housework;Assist for transportation;Help with stairs or ramp for entrance    Equipment Recommendations None recommended by PT  Recommendations for Other Services       Functional Status Assessment Patient has had a recent decline in their functional status and demonstrates the ability to make significant improvements in function in a reasonable and predictable amount of time.     Precautions / Restrictions Precautions Precautions:  Fall Restrictions Weight Bearing Restrictions: No      Mobility  Bed Mobility Overal bed mobility: Needs Assistance Bed Mobility: Supine to Sit, Sit to Supine     Supine to sit: Mod assist, Max assist, +2 for physical assistance, +2 for safety/equipment Sit to supine: Mod assist, Max assist, +2 for physical assistance, +2 for safety/equipment   General bed mobility comments: Increased time with cues for sequence, use of LE LE to self assist.  Physical assist to manage LEs, control trunk and complete rotation with use of bed pad    Transfers Overall transfer level: Needs assistance Equipment used: Rolling walker (2 wheels) Transfers: Sit to/from Stand Sit to Stand: Mod assist, +2 physical assistance, +2 safety/equipment, From elevated surface           General transfer comment: assist to bring wt up and fwd and to balance in intial standing with L hand on RW    Ambulation/Gait Ambulation/Gait assistance: Min assist, +2 physical assistance, +2 safety/equipment Gait Distance (Feet): 3 Feet Assistive device: Rolling walker (2 wheels) Gait Pattern/deviations: Step-to pattern, Decreased step length - right, Decreased step length - left, Shuffle, Trunk flexed       General Gait Details: Pt side shuffled up side of bed only with cues for sequence and L hand on RW  Stairs            Wheelchair Mobility    Modified Rankin (Stroke Patients Only)       Balance Overall balance assessment: Needs assistance Sitting-balance support: No upper extremity supported, Feet supported Sitting balance-Leahy Scale: Fair     Standing balance support: Single extremity supported Standing balance-Leahy Scale: Poor  Pertinent Vitals/Pain Pain Assessment Pain Assessment: Faces Faces Pain Scale: Hurts even more Pain Location: L thigh with movement Pain Descriptors / Indicators: Grimacing, Guarding, Sore Pain Intervention(s): Limited  activity within patient's tolerance, Monitored during session    Home Living Family/patient expects to be discharged to:: Unsure                   Additional Comments: Pt from IND living and had been doing own laundry and some meal prep and light house keeping herself. Gave up driving ~5MWU ago    Prior Function Prior Level of Function : Independent/Modified Independent;History of Falls (last six months)             Mobility Comments: Walking with cane despite encouragement to use walker ADLs Comments: Did not report difficulty with ADLs     Hand Dominance   Dominant Hand: Right    Extremity/Trunk Assessment   Upper Extremity Assessment Upper Extremity Assessment: RUE deficits/detail;Generalized weakness RUE Deficits / Details: wrist/hand splinted RUE: Unable to fully assess due to pain    Lower Extremity Assessment Lower Extremity Assessment: RLE deficits/detail RLE: Unable to fully assess due to pain    Cervical / Trunk Assessment Cervical / Trunk Assessment: Kyphotic  Communication   Communication: HOH  Cognition Arousal/Alertness: Awake/alert Behavior During Therapy: WFL for tasks assessed/performed Overall Cognitive Status: Within Functional Limits for tasks assessed                                          General Comments      Exercises     Assessment/Plan    PT Assessment Patient needs continued PT services  PT Problem List Decreased strength;Decreased range of motion;Decreased activity tolerance;Decreased balance;Decreased mobility;Decreased knowledge of use of DME;Pain;Decreased safety awareness       PT Treatment Interventions DME instruction;Gait training;Stair training;Functional mobility training;Therapeutic activities;Therapeutic exercise;Balance training;Patient/family education    PT Goals (Current goals can be found in the Care Plan section)  Acute Rehab PT Goals Patient Stated Goal: Regain IND PT Goal  Formulation: With patient Time For Goal Achievement: 12/10/21 Potential to Achieve Goals: Good    Frequency Min 3X/week     Co-evaluation               AM-PAC PT "6 Clicks" Mobility  Outcome Measure Help needed turning from your back to your side while in a flat bed without using bedrails?: A Lot Help needed moving from lying on your back to sitting on the side of a flat bed without using bedrails?: A Lot Help needed moving to and from a bed to a chair (including a wheelchair)?: A Lot Help needed standing up from a chair using your arms (e.g., wheelchair or bedside chair)?: A Lot Help needed to walk in hospital room?: Total Help needed climbing 3-5 steps with a railing? : Total 6 Click Score: 10    End of Session   Activity Tolerance: Patient limited by fatigue;Patient limited by pain Patient left: in bed;with call bell/phone within reach;with bed alarm set Nurse Communication: Mobility status PT Visit Diagnosis: Unsteadiness on feet (R26.81);Muscle weakness (generalized) (M62.81);Difficulty in walking, not elsewhere classified (R26.2);Pain;History of falling (Z91.81);Repeated falls (R29.6) Pain - Right/Left: Right Pain - part of body: Hand;Leg    Time: 1110-1150 PT Time Calculation (min) (ACUTE ONLY): 40 min   Charges:   PT Evaluation $PT Eval Low Complexity: 1 Low  PT Treatments $Therapeutic Activity: 8-22 mins        Debe Coder PT Acute Rehabilitation Services Pager [phone] Office [phone]   Mariselda Badalamenti 11/26/2021, 1:07 PM

## 2021-11-26 NOTE — Consult Note (Signed)
ORTHOPAEDIC CONSULTATION  REQUESTING PHYSICIAN: Terrilee Croak, MD  Chief Complaint: Right wrist pain  HPI: [name] is a 88 y.o. female who complains of right wrist and shin pain after a mechanical fall on the concrete.  Patient has had multiple falls, and difficulty with ambulation and balance.  Pain is located over the right distal radius, as well as over her forehead and face, denies any other injuries.  Pain is worse with movement of the right wrist.  She has been placed in a splint which has improved her symptoms.  Past Medical History:  Diagnosis Date   CAD (coronary artery disease)    mild nonobstructive disease on cath in 2003   Cancer [facility])    Cardiomyopathy    Probable Takotsubo, severe CP w/ normal cath in 1994. Severe CP in 2003 w/ widespread T wave inversions on ECG. Cath w/ minimal coronary disease but LV-gram showed periapical severe hypokinesis and basilar hyperkinesia (EF 40%). Last echo in 4/09 confirmed full LV functional recovery with EF 60%, no regional wall motion abnormalities, mild to moderate LVH.   Chronic diastolic CHF (congestive heart failure) ([place]) 09/13/2019   CVA (cerebral infarction)    Depression with anxiety 03/29/2011   lost husband 3'09   DVT (deep venous thrombosis) ([place])    after venous ablation, R leg   Fracture 09/10/2007   L2, status post vertebroplasty of L2 performed by IR   GERD (gastroesophageal reflux disease)    Hemorrhoids    Hiatal hernia 03/29/2011   no nerve problems   Hyperlipidemia    Hyperplastic colon polyp 06/2001   Hypertension    Iron deficiency anemia due to chronic blood loss 12/30/2017   OA (osteoarthritis) of knee 03/29/2011   w/ bilateral knee pain-not a problem now   OSA (obstructive sleep apnea) 03/29/2011   no cpap used, not a problem now.   Osteoporosis    Otalgia of both ears    Dr. Simeon Craft   Polycythemia    Stroke [facility]) 03/29/2011   CVA x2 -last 10'12-?TIA(visual problems)   Varicose vein    Past  Surgical History:  Procedure Laterality Date   APPENDECTOMY     BACK SURGERY     BREAST BIOPSY     BREAST CYST EXCISION     BREAST LUMPECTOMY Left 2013   left stage I left breast cancer   CHOLECYSTECTOMY  04/09/2011   Procedure: LAPAROSCOPIC CHOLECYSTECTOMY WITH INTRAOPERATIVE CHOLANGIOGRAM;  Surgeon: Odis Hollingshead, MD;  Location: [facility];  Service: General;  Laterality: N/A;   Dental Extraction     L maxillary molar   INTRAMEDULLARY (IM) NAIL INTERTROCHANTERIC Left 12/25/2019   Procedure: INTRAMEDULLARY (IM) NAIL INTERTROCHANTRIC;  Surgeon: Paralee Cancel, MD;  Location: [facility];  Service: Orthopedics;  Laterality: Left;   IR KYPHO THORACIC WITH BONE BIOPSY  09/15/2019   IR KYPHO THORACIC WITH BONE BIOPSY  07/13/2020       IR KYPHO THORACIC WITH BONE BIOPSY  07/13/2020   IR VERTEBROPLASTY CERV/THOR BX INC UNI/BIL INC/INJECT/IMAGING  11/10/2019   KYPHOPLASTY N/A 11/16/2020   Procedure: T11,  L1,KYPHOPLASTY;  Surgeon: Melina Schools, MD;  Location: [place];  Service: Orthopedics;  Laterality: N/A;   KYPHOSIS SURGERY  08/2007   cement used   OVARIAN CYST SURGERY     left   RADIOLOGY WITH ANESTHESIA N/A 11/10/2019   Procedure: VERTEBROPLASTY;  Surgeon: Luanne Bras, MD;  Location: [place];  Service: Radiology;  Laterality: N/A;   TONSILLECTOMY     TOTAL  KNEE ARTHROPLASTY  06/2010   left   TUBAL LIGATION     Social History   Socioeconomic History   Marital status: Widowed    Spouse name: Not on file   Number of children: 3   Years of education: Not on file   Highest education level: Not on file  Occupational History   Occupation: n/a  Tobacco Use   Smoking status: Some Days    Packs/day: 1.00    Years: 37.00    Total pack years: 37.00    Types: Cigarettes    Start date: 06/26/1951   Smokeless tobacco: Never   Tobacco comments:    quit 25 years ago  Vaping Use   Vaping Use: Never used  Substance and Sexual Activity   Alcohol use: Not Currently    Alcohol/week: 1.0 standard  drink of alcohol    Types: 1 Glasses of wine per week   Drug use: No   Sexual activity: Not Currently  Other Topics Concern   Not on file  Social History Narrative   Lost husband 07/29/07-    Lives in [place]   has 3 daughters, lost Mickel Baas (03-2017)   Moved to ALF 04-2021   Early Chars lives in [place], [place] ( [place])     Not driving as of 12-1827            Social Determinants of Health   Financial Resource Strain: Low Risk  (08/10/2021)   Overall Financial Resource Strain (CARDIA)    Difficulty of Paying Living Expenses: Not hard at all  Food Insecurity: Not on file  Transportation Needs: No Transportation Needs (08/10/2021)   PRAPARE - Hydrologist (Medical): No    Lack of Transportation (Non-Medical): No  Physical Activity: Not on file  Stress: Not on file  Social Connections: Not on file   Family History  Problem Relation Age of Onset   Breast cancer Mother    Heart disease Father        MIs   Colon cancer Other        cousin   Breast cancer Sister    Leukemia Brother        GM   Allergies  Allergen Reactions   Penicillins Hives, Rash and Other (See Comments)    Has patient had a PCN reaction causing immediate rash, facial/tongue/throat swelling, SOB or lightheadedness with hypotension: Yes Has patient had a PCN reaction causing severe rash involving mucus membranes or skin necrosis: yes Has patient had a PCN reaction that required hospitalization: No Has patient had a PCN reaction occurring within the last 10 years: no If all of the above answers are "NO", then may proceed with Cephalosporin use.  Other Reaction: OTHER REACTION   Valsartan Itching and Other (See Comments)   Atorvastatin Diarrhea   Carvedilol Other (See Comments)    "teeth hurt when I took it"    Ezetimibe Nausea Only   Fluvastatin Sodium Other (See Comments)    "Can't remember"   Magnesium Hydroxide Other (See Comments)    REACTION: triggers HAs   Meloxicam Other  (See Comments)    Pt seeing auro's / spots.     Pneumovax [Pneumococcal Polysaccharide Vaccine] Other (See Comments)    Local reaction   Quinapril Hcl Other (See Comments)     "feelings of tiredness"   Simvastatin Diarrhea   Topamax [Topiramate] Itching   Vit D-Vit E-Safflower Oil Other (See Comments)    Headaches     Positive  ROS: All other systems have been reviewed and were otherwise negative with the exception of those mentioned in the HPI and as above.  Physical Exam: General: Alert, no acute distress Cardiovascular: No pedal edema Respiratory: No cyanosis, no use of accessory musculature GI: No organomegaly, abdomen is soft and non-tender Skin: No lesions in the area of chief complaint, she is currently in a splint, she has multiple abrasions and ecchymosis diffusely over her face and chin. Neurologic: Sensation intact distally Psychiatric: Patient is competent for consent with normal mood and affect Lymphatic: No axillary or cervical lymphadenopathy  MUSCULOSKELETAL: Right hand is in a well fitted splint.  Sensation is intact throughout the fingers.  All fingers flex extend and abduct.  Assessment: Principal Problem:   Fall Active Problems:   Essential hypertension   Chronic diastolic CHF (congestive heart failure) (H[facility])   Closed right radial fracture   Chin laceration   Shoulder abrasion, right, initial encounter   Supraorbital ecchymosis   Right distal radius fracture with minimal displacement  Plan: This is an acute significant injury, she will need to be nonweightbearing with the right upper extremity but she can use a platform walker with weightbearing through the elbow.  Plan for nonsurgical management, PT/OT, fall prevention, she may need placement depending on her social support.  Plan to follow-up with me in 1 week with x-rays, and will likely convert to a cast then.   Johnny Bridge, MD Cell [phone]   11/26/2021 8:32 PM

## 2021-11-26 NOTE — Progress Notes (Signed)
PROGRESS NOTE  [name]  DOB: 1929-09-22  PCP: Colon Branch, MD EVO:[id number]  DOA: 11/24/2021  LOS: 0 days  Hospital Day: 3  Brief narrative: [name] is a 88 y.o. female with PMH significant for HTN, HLD, CAD, chronic diastolic CHF, CVA, DVT/PE, hiatal hernia, iron deficiency anemia, osteoarthritis, polycythemia, breast cancer s/p left lumpectomy patient presented to the ED from the [facility] ALF after a fall. Patient apparently was walking outside, lost her balance while trying to make a turn and fell forward and hit her head on the floor sustaining a hematoma in her right forehead, skin tear on the right shoulder and laceration on the bottom of the chin, also complained of right wrist pain, no LOC. Patient has gait impairment at baseline and has had 4 falls in the last 2 years.  She is supposed to ambulate with a walker but she does not like to use it.    In the ED, patient was afebrile, blood pressure was elevated to 170s. Labs with potassium low at 3, calcium level low at 6.8, creatinine normal CT head, cervical spine was negative. CT maxillofacial was negative for any fracture but showed right supraorbital hematoma. Right shoulder and femur x-ray were negative for fracture.  Bilateral pelvis x-ray was negative for any hip fracture. Right hip x-ray did not show any fractures but showed osteopenia and degenerative change. Right wrist x-ray was revealing for right impacted extra articular fracture of the distal radius.   Admitted to hospitalist service.  Subjective: Patient was seen and examined this morning.   Lying down in bed.  Not in distress.  No new symptoms. Study documented today.  No surgical intervention needed for her right wrist fracture. Seen by physical therapy.  SNF recommended.  Assessment and plan: Right wrist fracture -X-ray showed right impacted extra-articular fracture of the distal radius -Placed on wrist splint. -No need of surgical  intervention per orthopedics. -Continue pain control  Supraorbital ecchymosis -No visual changes   Shoulder abrasion, right, initial encounter -Has several large areas of abrasion with sloughing of skin to right shoulder -Wound care per RN   Chin laceration -Repaired in the ED -Wound care per RN  Frequent falls -Patient has gait impairment at baseline and has had 4 falls in the last 2 years.  She is supposed to ambulate with a walker but she does not like to use it.  She lost her balance while making a turn and fell leading to injuries and this hospitalization  -PT eval ordered.     Chronic diastolic CHF  essential hypertension -PTA on metoprolol tartrate 50 mg twice daily, Lasix 20 mg every other day -Remains euvolemic  -Continue metoprolol.  Keep Lasix on hold for now.  CAD/stroke/HLD -Continue aspirin.  Not on statin apparently because of diarrhea as a side effect  Osteoporosis/osteopenia -On Fosamax 70 mg every Sunday    Goals of care   Code Status: Full Code    Mobility: Encourage ambulation with PT  Skin assessment:     Nutritional status:  Body mass index is 18.79 kg/m.          Diet:  Diet Order             Diet Heart Room service appropriate? Yes; Fluid consistency: Thin  Diet effective now                   DVT prophylaxis:  SCDs Start: 11/25/21 0115   Antimicrobials: None Fluid: None Consultants:  Orthopedics Family Communication: None at bedside  Status is: Observation  Continue in-hospital care because: SNF pending Level of care: Med-Surg   Dispo: The patient is from: Assisted living facility              Anticipated d/c is to: SNF recommended by PT              Patient currently is not medically stable to d/c.   Difficult to place patient No     Infusions:    Scheduled Meds:  aspirin EC  81 mg Oral Daily   metoprolol tartrate  50 mg Oral BID   pantoprazole  40 mg Oral Daily    PRN  meds: HYDROcodone-acetaminophen, LORazepam, morphine injection   Antimicrobials: Anti-infectives (From admission, onward)    None       Objective: Vitals:   11/26/21 0523 11/26/21 1354  BP: (!) 169/82 121/68  Pulse: 80 73  Resp: 14 16  Temp: 99.6 F (37.6 C) 98.7 F (37.1 C)  SpO2: 96% 97%    Intake/Output Summary (Last 24 hours) at 11/26/2021 1401 Last data filed at 11/26/2021 0529 Gross per 24 hour  Intake --  Output 700 ml  Net -700 ml    Filed Weights   11/24/21 1957 11/25/21 0155  Weight: 57.6 kg 52.8 kg   Weight change:  Body mass index is 18.79 kg/m.   Physical Exam: General exam: Pleasant, elderly Caucasian female.  Pain controlled Skin: No rashes, lesions or ulcers. HEENT: Atraumatic, normocephalic, no obvious bleeding.  Ecchymosis present on right forehead and periorbital area Lungs: Clear to auscultation bilaterally CVS: Regular rate and rhythm, no murmur GI/Abd soft, nontender, nondistended, bowel sound present CNS: Alert, awake, oriented x3 Psychiatry: Mood appropriate Extremities: No pedal edema, no calf tenderness.  Right wrist in a splint  Data Review: I have personally reviewed the laboratory data and studies available.  F/u labs ordered Unresulted Labs (From admission, onward)    None       Signed, Terrilee Croak, MD [facility] 11/26/2021

## 2021-11-27 DIAGNOSIS — S52551A Other extraarticular fracture of lower end of right radius, initial encounter for closed fracture: Secondary | ICD-10-CM | POA: Diagnosis not present

## 2021-11-27 DIAGNOSIS — W19XXXA Unspecified fall, initial encounter: Secondary | ICD-10-CM | POA: Diagnosis not present

## 2021-11-27 DIAGNOSIS — I10 Essential (primary) hypertension: Secondary | ICD-10-CM | POA: Diagnosis not present

## 2021-11-27 NOTE — Evaluation (Signed)
Occupational Therapy Evaluation Patient Details Name: [name] MRN: [id number] DOB: Oct 06, 1929 Today's Date: 11/27/2021   History of Present Illness Patient is a 88 year old female who presented to the hospital after a fall on concrete with right wrist and shin pain. imaging revealed closed right radial fracture. DZH:GDJMEQA diastolic heart failure, hypertension, history of PE/DVT, CVA, CAD, breast cancer s/p left lumpectomy, polycythmia,h/o T9 comporession fx,parietooccipital calvarial fracture 2021.L hip fx s/p IM nailing, T11 kyphoplasty, L2 vertebroplasty and CHF   Clinical Impression   Patient is a 88 year old female who was living at ILF at rollator level . Patient reports multiple falls but unable to give timeline of falls. Patient would benefit from increased caregiver support in next level of care. Patient was noted to have decreased functional activity tolerance, increased pain in RLE with movement and R wrist and decreased safety awareness impacting participation in ADLs. Patient would continue to benefit from skilled OT services at this time while admitted and after d/c to address noted deficits in order to improve overall safety and independence in ADLs.       Recommendations for follow up therapy are one component of a multi-disciplinary discharge planning process, led by the attending physician.  Recommendations may be updated based on patient status, additional functional criteria and insurance authorization.   Follow Up Recommendations  Skilled nursing-short term rehab (<3 hours/day)    Assistance Recommended at Discharge Frequent or constant Supervision/Assistance  Patient can return home with the following A little help with bathing/dressing/bathroom;Assistance with cooking/housework;Direct supervision/assist for financial management;Assist for transportation;Help with stairs or ramp for entrance;Direct supervision/assist for medications management;A lot of help with  walking and/or transfers    Functional Status Assessment  Patient has had a recent decline in their functional status and demonstrates the ability to make significant improvements in function in a reasonable and predictable amount of time.  Equipment Recommendations  Other (comment) (RW with platform on R side)    Recommendations for Other Services       Precautions / Restrictions Precautions Precautions: Fall Restrictions Weight Bearing Restrictions: Yes RUE Weight Bearing: Non weight bearing Other Position/Activity Restrictions: ok to weight bear through elbow on platform walker per Dr.Landau's note on 7/16.      Mobility Bed Mobility               General bed mobility comments: patient was up on 3 in1 commode at start of session and transitioned to recliner at end    Transfers                          Balance Overall balance assessment: Needs assistance Sitting-balance support: No upper extremity supported, Feet supported Sitting balance-Leahy Scale: Fair     Standing balance support: Single extremity supported Standing balance-Leahy Scale: Poor                             ADL either performed or assessed with clinical judgement   ADL Overall ADL's : Needs assistance/impaired Eating/Feeding: Set up;Sitting   Grooming: Sitting;Minimal assistance   Upper Body Bathing: Set up;Minimal assistance;Sitting   Lower Body Bathing: Minimal assistance;Sitting/lateral leans   Upper Body Dressing : Minimal assistance;Sitting   Lower Body Dressing: Minimal assistance;Sit to/from stand;Sitting/lateral leans   Toilet Transfer: Moderate assistance;BSC/3in1 Toilet Transfer Details (indicate cue type and reason): platform walker. poor activity tolerance during session Toileting- Clothing Manipulation and Hygiene: Minimal assistance;Sit  to/from stand Toileting - Water quality scientist Details (indicate cue type and reason): cues for proper hand  placement and to prevent WB through wrist             Vision         Perception     Praxis      Pertinent Vitals/Pain Pain Assessment Pain Assessment: Faces Faces Pain Scale: Hurts even more Pain Location: L thigh with movement Pain Descriptors / Indicators: Grimacing, Guarding, Sore Pain Intervention(s): Limited activity within patient's tolerance, Monitored during session, Repositioned     Hand Dominance     Extremity/Trunk Assessment Upper Extremity Assessment Upper Extremity Assessment: RUE deficits/detail RUE Deficits / Details: wrist/hand splinted. patient was able to ROM shoulder and elbow WFL. no MMT tested with fracture and splint in place. currently NWB   Lower Extremity Assessment Lower Extremity Assessment: Defer to PT evaluation   Cervical / Trunk Assessment Cervical / Trunk Assessment: Kyphotic   Communication     Cognition Arousal/Alertness: Awake/alert Behavior During Therapy: WFL for tasks assessed/performed Overall Cognitive Status: Within Functional Limits for tasks assessed                                 General Comments: patient noted to have poor safety awareness during session     General Comments  patient noted to have R side facial brusing with dressing underneath chin. patient reported it was uncomfortable.    Exercises     Shoulder Instructions      Home Living                                          Prior Functioning/Environment                          OT Problem List: Decreased activity tolerance;Impaired balance (sitting and/or standing);Decreased safety awareness;Decreased knowledge of precautions;Decreased knowledge of use of DME or AE;Pain;Impaired UE functional use      OT Treatment/Interventions: Self-care/ADL training;Therapeutic exercise;Neuromuscular education;Energy conservation;DME and/or AE instruction;Therapeutic activities;Balance training;Patient/family education     OT Goals(Current goals can be found in the care plan section) Acute Rehab OT Goals Patient Stated Goal: to get back to ILF OT Goal Formulation: Patient unable to participate in goal setting Time For Goal Achievement: 12/11/21 Potential to Achieve Goals: Fair  OT Frequency: Min 2X/week    Co-evaluation              AM-PAC OT "6 Clicks" Daily Activity     Outcome Measure Help from another person eating meals?: A Little Help from another person taking care of personal grooming?: A Little Help from another person toileting, which includes using toliet, bedpan, or urinal?: A Lot Help from another person bathing (including washing, rinsing, drying)?: A Lot Help from another person to put on and taking off regular upper body clothing?: A Little Help from another person to put on and taking off regular lower body clothing?: A Lot 6 Click Score: 15   End of Session Equipment Utilized During Treatment: Rolling walker (2 wheels);Gait belt Nurse Communication: Other (comment) (patients request for coffee)  Activity Tolerance: Patient limited by pain;Patient limited by fatigue Patient left: in chair;with call bell/phone within reach;with chair alarm set  OT Visit Diagnosis: Unsteadiness on feet (R26.81);Other abnormalities of gait and mobility (  R26.89);Muscle weakness (generalized) (M62.81)                Time: 5465-6812 OT Time Calculation (min): 36 min Charges:  OT General Charges $OT Visit: 1 Visit OT Evaluation $OT Eval Moderate Complexity: 1 Mod OT Treatments $Self Care/Home Management : 8-22 mins  Jackelyn Poling OTR/L, MS Acute Rehabilitation Department Office# [phone] Pager# [phone]   Marcellina Millin 11/27/2021, 1:32 PM

## 2021-11-27 NOTE — NC FL2 (Signed)
[place] LEVEL OF CARE SCREENING TOOL     IDENTIFICATION  Patient Name: [name] Birthdate: Jul 19, 1929 Sex: female Admission Date (Current Location): 11/24/2021  [facility] and [place] Number:  Herbalist and Address:  [facility],  [place], [place]      Provider Number: [id number]  Attending Physician Name and Address:  Terrilee Croak, MD  Relative Name and Phone Number:  [name] Daughter   [place]   [phone]    Current Level of Care: SNF Recommended Level of Care: [place] Prior Approval Number:    Date Approved/Denied:   PASRR Number: [id number]  Discharge Plan: SNF    Current Diagnoses: Patient Active Problem List   Diagnosis Date Noted   Fall 11/25/2021   Closed right radial fracture 11/25/2021   Chin laceration 11/25/2021   Shoulder abrasion, right, initial encounter 11/25/2021   Supraorbital ecchymosis 11/25/2021   Protein-calorie malnutrition, severe 10/09/2021   Acute encephalopathy 10/07/2021   Compression fracture of T7 vertebra  10/07/2021   Rib pain on right side 01/24/2021   Skin rash 01/24/2021   Mastalgia 01/24/2021   PE (pulmonary thromboembolism) ([place]) 12/25/2020   Lumbar compression fracture ([place]) 11/16/2020   Generalized weakness 10/03/2020   GERD (gastroesophageal reflux disease)    Dyspnea    DVT (deep venous thrombosis) (HCC)    Diverticulitis    CAD (coronary artery disease)    Acute midline thoracic back pain 03/04/2020   Pressure injury of skin 12/27/2019   S/p left hip fracture    Comminuted fracture of hip, left, open type I or II, initial encounter ([place]) 12/24/2019   Closed left hip fracture ([place]) 12/24/2019   ICH (intracerebral hemorrhage) ([place]) 11/04/2019   Intractable back pain 09/13/2019   Compression fracture of T9 vertebra (HCC) 09/13/2019   Chronic diastolic CHF (congestive heart failure) ([place]) 09/13/2019    Overweight (BMI 25.0-29.9) 09/13/2019   Constipation 09/13/2019   Raynaud's phenomenon without gangrene 05/19/2018   Iron deficiency anemia due to chronic blood loss 12/30/2017   Insomnia 06/25/2017   Mild CAD 05/24/2017   PCP NOTES >>>>>>>>> 02/15/2015   Superficial thrombophlebitis 04/06/2014   Annual physical exam 08/18/2012   Breast cancer ([place]) 11/05/2011   Polycythemia 11/05/2011   Stroke ([place]) 03/29/2011   OSA (obstructive sleep apnea) 03/29/2011   OA (osteoarthritis) of knee 03/29/2011   Hiatal hernia 03/29/2011   E. coli gastroenteritis 03/29/2011   Depression with anxiety 03/29/2011   Chest pain 01/16/2011   TIA (transient ischemic attack) 01/16/2011   CARDIOMYOPATHY 08/03/2009   DJD -- pain mgmt 08/16/2008   ABDOMINAL PAIN, LEFT LOWER QUADRANT 07/05/2008   GAIT DISTURBANCE 10/21/2007   ANXIETY DEPRESSION 10/08/2007   Fracture 09/10/2007   Hyperlipidemia 06/25/2007   Takotsubo cardiomyopathy 06/25/2007   GERD without esophagitis 06/25/2007   Essential hypertension 09/30/2006   Senile osteoporosis 09/30/2006   Migraine with aura 09/30/2006   Hyperplastic colon polyp 06/2001    Orientation RESPIRATION BLADDER Height & Weight     Self, Time, Situation, Place  O2 Incontinent Weight: 116 lb 6.5 oz (52.8 kg) Height:  '5\' 6"'$  (167.6 cm)  BEHAVIORAL SYMPTOMS/MOOD NEUROLOGICAL BOWEL NUTRITION STATUS      Continent Diet (Regular)  AMBULATORY STATUS COMMUNICATION OF NEEDS Skin   Limited Assist Verbally Normal                       Personal Care Assistance Level of Assistance  Bathing, Feeding, Dressing Bathing Assistance: Limited assistance Feeding assistance: Independent Dressing Assistance: Limited assistance     Functional Limitations Info  Sight, Hearing, Speech Sight Info: Adequate Hearing Info: Adequate Speech Info: Adequate    SPECIAL CARE FACTORS FREQUENCY  PT (By licensed PT), OT (By licensed OT)     PT Frequency: Minimum 5x a week OT  Frequency: Minimum 5x a week            Contractures Contractures Info: Not present    Additional Factors Info  Code Status, Allergies Code Status Info: Full Code Allergies Info: Penicillins   Valsartan   Atorvastatin   Carvedilol   Ezetimibe   Fluvastatin Sodium   Magnesium Hydroxide   Meloxicam   Pneumovax (Pneumococcal Polysaccharide Vaccine)   Quinapril Hcl   Simvastatin   Topamax (Topiramate)   Vit D-vit E-safflower Oil           Current Medications (11/27/2021):  This is the current hospital active medication list Current Facility-Administered Medications  Medication Dose Route Frequency Provider Last Rate Last Admin   aspirin EC tablet 81 mg  81 mg Oral Daily Tu, Ching T, DO   81 mg at 11/27/21 0952   HYDROcodone-acetaminophen (NORCO/VICODIN) 5-325 MG per tablet 1 tablet  1 tablet Oral Q6H PRN Tu, Ching T, DO   1 tablet at 11/27/21 0759   LORazepam (ATIVAN) tablet 0.5 mg  0.5 mg Oral BID PRN Tu, Ching T, DO   0.5 mg at 11/27/21 1947   metoprolol tartrate (LOPRESSOR) tablet 50 mg  50 mg Oral BID Tu, Ching T, DO   50 mg at 11/27/21 2132   morphine (PF) 2 MG/ML injection 1 mg  1 mg Intravenous Q3H PRN Tu, Ching T, DO   1 mg at 11/25/21 0152   pantoprazole (PROTONIX) EC tablet 40 mg  40 mg Oral Daily Hollace Hayward K, NP   40 mg at 11/27/21 0998     Discharge Medications: Please see discharge summary for a list of discharge medications.  Relevant Imaging Results:  Relevant Lab Results:   Additional Information SSN [id number]  Ross Ludwig, LCSW

## 2021-11-27 NOTE — TOC Initial Note (Signed)
Transition of Care [facility]) - Initial/Assessment Note    Patient Details  Name: [name] MRN: [id number] Date of Birth: February 23, 1930  [facility] CM/SW Contact:    Ross Ludwig, LCSW Phone Number: 11/27/2021, 10:59 PM  Clinical Narrative:                  PT recommended SNF placement for patient.  Patient is from [facility], currently receiving home health services through [facility] per notes.  Patient was recently discharged from [facility] SNF.  Patient has been faxed out awaiting bed offers for patient to go to SNF.  Expected Discharge Plan: Skilled Nursing Facility Barriers to Discharge: Insurance Authorization   Patient Goals and CMS Choice Patient states their goals for this hospitalization and ongoing recovery are:: To go to SNF for rehab, then return to [facility]      Expected Discharge Plan and Services Expected Discharge Plan: [place] arrangements for the past 2 months: [place]                                      Prior Living Arrangements/Services Living arrangements for the past 2 months: [place] Lives with:: Self, Facility Resident Patient language and need for interpreter reviewed:: Yes Do you feel safe going back to the place where you live?: No   Patient needs SNF for rehab, before returning back home.  Need for Family Participation in Patient Care: Yes (Comment) Care giver support system in place?: No (comment) Current home services: Home PT, Home OT Criminal Activity/Legal Involvement Pertinent to Current Situation/Hospitalization: No - Comment as needed  Activities of Daily Living Home Assistive Devices/Equipment: Cane (specify quad or straight), Eyeglasses, Dentures (specify type), Walker (specify type), Splint (specify type) ADL Screening (condition at time of admission) Patient's cognitive ability adequate to safely complete  daily activities?: Yes Is the patient deaf or have difficulty hearing?: Yes Does the patient have difficulty seeing, even when wearing glasses/contacts?: Yes Does the patient have difficulty concentrating, remembering, or making decisions?: No Patient able to express need for assistance with ADLs?: Yes Does the patient have difficulty dressing or bathing?: No Independently performs ADLs?: Yes (appropriate for developmental age) Does the patient have difficulty walking or climbing stairs?: Yes Weakness of Legs: Both Weakness of Arms/Hands: Right  Permission Sought/Granted Permission sought to share information with : Case Manager, Customer service manager, Family Supports Permission granted to share information with : Yes, Release of Information Signed  Share Information with NAMEJosepha Pigg Daughter   [phone]  Hill,Phillip Yolanda Bonine   [phone]  Permission granted to share info w AGENCY: SNF admissions        Emotional Assessment Appearance:: Appears stated age   Affect (typically observed): Accepting, Stable Orientation: : Oriented to Self, Oriented to Place, Oriented to  Time, Oriented to Situation Alcohol / Substance Use: Not Applicable Psych Involvement: No (comment)  Admission diagnosis:  Fall [W19.XXXA] Contusion of nose, initial encounter [S00.33XA] Fall, initial encounter [W19.XXXA] Laceration of chin, initial encounter [S01.81XA] Contusion of chin, initial encounter [S00.83XA] Abrasion of forehead, initial encounter [S00.81XA] Periorbital hematoma of right eye [H05.231] Hematoma of right thigh, initial encounter [S70.11XA] Other closed extra-articular fracture of distal end of right radius, initial encounter [S52.551A] Patient Active Problem List   Diagnosis Date Noted   Fall 11/25/2021   Closed right  radial fracture 11/25/2021   Chin laceration 11/25/2021   Shoulder abrasion, right, initial encounter 11/25/2021   Supraorbital ecchymosis 11/25/2021    Protein-calorie malnutrition, severe 10/09/2021   Acute encephalopathy 10/07/2021   Compression fracture of T7 vertebra  10/07/2021   Rib pain on right side 01/24/2021   Skin rash 01/24/2021   Mastalgia 01/24/2021   PE (pulmonary thromboembolism) ([place]) 12/25/2020   Lumbar compression fracture ([place]) 11/16/2020   Generalized weakness 10/03/2020   GERD (gastroesophageal reflux disease)    Dyspnea    DVT (deep venous thrombosis) (HCC)    Diverticulitis    CAD (coronary artery disease)    Acute midline thoracic back pain 03/04/2020   Pressure injury of skin 12/27/2019   S/p left hip fracture    Comminuted fracture of hip, left, open type I or II, initial encounter ([place]) 12/24/2019   Closed left hip fracture ([place]) 12/24/2019   ICH (intracerebral hemorrhage) ([place]) 11/04/2019   Intractable back pain 09/13/2019   Compression fracture of T9 vertebra ([place]) 09/13/2019   Chronic diastolic CHF (congestive heart failure) ([place]) 09/13/2019   Overweight (BMI 25.0-29.9) 09/13/2019   Constipation 09/13/2019   Raynaud's phenomenon without gangrene 05/19/2018   Iron deficiency anemia due to chronic blood loss 12/30/2017   Insomnia 06/25/2017   Mild CAD 05/24/2017   PCP NOTES >>>>>>>>> 02/15/2015   Superficial thrombophlebitis 04/06/2014   Annual physical exam 08/18/2012   Breast cancer ([place]) 11/05/2011   Polycythemia 11/05/2011   Stroke ([place]) 03/29/2011   OSA (obstructive sleep apnea) 03/29/2011   OA (osteoarthritis) of knee 03/29/2011   Hiatal hernia 03/29/2011   E. coli gastroenteritis 03/29/2011   Depression with anxiety 03/29/2011   Chest pain 01/16/2011   TIA (transient ischemic attack) 01/16/2011   CARDIOMYOPATHY 08/03/2009   DJD -- pain mgmt 08/16/2008   ABDOMINAL PAIN, LEFT LOWER QUADRANT 07/05/2008   GAIT DISTURBANCE 10/21/2007   ANXIETY DEPRESSION 10/08/2007   Fracture 09/10/2007   Hyperlipidemia 06/25/2007   Takotsubo cardiomyopathy 06/25/2007   GERD without esophagitis  06/25/2007   Essential hypertension 09/30/2006   Senile osteoporosis 09/30/2006   Migraine with aura 09/30/2006   Hyperplastic colon polyp 06/2001   PCP:  Colon Branch, MD Pharmacy:   [facility] [place], [address] AT [facility] OF [address] [place] [address] Phone: [phone] Fax: [phone]  [place] [address] Phone: [phone] Fax: [phone]     Social Determinants of Health (SDOH) Interventions    Readmission Risk Interventions    09/14/2019   11:11 AM  Readmission Risk Prevention Plan  Medication Screening Complete  Transportation Screening Complete

## 2021-11-27 NOTE — Progress Notes (Signed)
PROGRESS NOTE  [name]  DOB: 12-Oct-1929  PCP: Colon Branch, MD IOX:[id number]  DOA: 11/24/2021  LOS: 0 days  Hospital Day: 4  Brief narrative: [name] is a 88 y.o. female with PMH significant for HTN, HLD, CAD, chronic diastolic CHF, CVA, DVT/PE, hiatal hernia, iron deficiency anemia, osteoarthritis, polycythemia, breast cancer s/p left lumpectomy patient presented to the ED from the [facility] after a fall. Patient apparently was walking outside, lost her balance while trying to make a turn and fell forward and hit her head on the floor sustaining a hematoma in her right forehead, skin tear on the right shoulder and laceration on the bottom of the chin, also complained of right wrist pain, no LOC. Patient has gait impairment at baseline and has had 4 falls in the last 2 years.  She is supposed to ambulate with a walker but she does not like to use it.    In the ED, patient was afebrile, blood pressure was elevated to 170s. Labs with potassium low at 3, calcium level low at 6.8, creatinine normal CT head, cervical spine was negative. CT maxillofacial was negative for any fracture but showed right supraorbital hematoma. Right shoulder and femur x-ray were negative for fracture.  Bilateral pelvis x-ray was negative for any hip fracture. Right hip x-ray did not show any fractures but showed osteopenia and degenerative change. Right wrist x-ray was revealing for right impacted extra articular fracture of the distal radius.   Admitted to hospitalist service.  Subjective: Patient was seen and examined this morning.   Lying down in bed.  Not in distress.  No new symptoms. Pending placement   Assessment and plan: Right wrist fracture -X-ray showed right impacted extra-articular fracture of the distal radius -Placed on wrist splint. -Orthopedic consult appreciated.  Per Dr. Luanna Cole note from 7/16, patient will need to be nonweightbearing with the right upper extremity  but can use a platform walker with weightbearing through the elbow.  Nonsurgical management advised.  Currently has a splint in place.  Recommended follow-up in the office in 1 week with x-rays and will likely need to converted to a cast at the time.   -Continue pain control  Supraorbital ecchymosis -No visual changes   Shoulder abrasion, right, initial encounter -Has several large areas of abrasion with sloughing of skin to right shoulder -Wound care per RN   Chin laceration -Repaired in the ED -Wound care per RN  Frequent falls -Patient has gait impairment at baseline and has had 4 falls in the last 2 years.  She is supposed to ambulate with a walker but she does not like to use it.  She lost her balance while making a turn and fell leading to injuries and this hospitalization  -PT eval obtained.  SNF recommended..     Chronic diastolic CHF  essential hypertension -PTA on metoprolol tartrate 50 mg twice daily, Lasix 20 mg every other day -Remains euvolemic  -Continue metoprolol.  Keep Lasix on hold for now.  CAD/stroke/HLD -Continue aspirin.  Not on statin apparently because of diarrhea as a side effect  Osteoporosis/osteopenia -On Fosamax 70 mg every Sunday as an outpatient    Goals of care   Code Status: Full Code    Mobility: Encourage ambulation with PT  Skin assessment:     Nutritional status:  Body mass index is 18.79 kg/m.          Diet:  Diet Order  Diet Heart Room service appropriate? Yes; Fluid consistency: Thin  Diet effective now                   DVT prophylaxis:  SCDs Start: 11/25/21 0115   Antimicrobials: None Fluid: None Consultants: Orthopedics Family Communication: None at bedside  Status is: Observation  Continue in-hospital care because: SNF pending Level of care: Med-Surg   Dispo: The patient is from: Assisted living facility              Anticipated d/c is to: SNF recommended by PT              Patient  currently is not medically stable to d/c.   Difficult to place patient No     Infusions:    Scheduled Meds:  aspirin EC  81 mg Oral Daily   metoprolol tartrate  50 mg Oral BID   pantoprazole  40 mg Oral Daily    PRN meds: HYDROcodone-acetaminophen, LORazepam, morphine injection   Antimicrobials: Anti-infectives (From admission, onward)    None       Objective: Vitals:   11/27/21 0958 11/27/21 1345  BP:  114/61  Pulse:  66  Resp:  16  Temp:  98.2 F (36.8 C)  SpO2: 100% 100%    Intake/Output Summary (Last 24 hours) at 11/27/2021 1621 Last data filed at 11/27/2021 1300 Gross per 24 hour  Intake 240 ml  Output 1150 ml  Net -910 ml   Filed Weights   11/24/21 1957 11/25/21 0155  Weight: 57.6 kg 52.8 kg   Weight change:  Body mass index is 18.79 kg/m.   Physical Exam: General exam: Pleasant, elderly Caucasian female.  Pain controlled Skin: No rashes, lesions or ulcers. HEENT: Atraumatic, normocephalic, no obvious bleeding.  Ecchymosis present on right forehead and periorbital area.  Has a bandaged area of abrasion on the chin Lungs: Clear to auscultation bilaterally CVS: Regular rate and rhythm, no murmur GI/Abd soft, nontender, nondistended, bowel sound present CNS: Alert, awake, oriented x3 Psychiatry: Mood appropriate Extremities: No pedal edema, no calf tenderness.  Right wrist in a splint  Data Review: I have personally reviewed the laboratory data and studies available.  F/u labs ordered Unresulted Labs (From admission, onward)    None       Signed, Terrilee Croak, MD [facility] 11/27/2021

## 2021-11-28 ENCOUNTER — Other Ambulatory Visit (HOSPITAL_COMMUNITY): Payer: Self-pay

## 2021-11-28 DIAGNOSIS — S52591D Other fractures of lower end of right radius, subsequent encounter for closed fracture with routine healing: Secondary | ICD-10-CM | POA: Diagnosis not present

## 2021-11-28 DIAGNOSIS — D751 Secondary polycythemia: Secondary | ICD-10-CM | POA: Diagnosis not present

## 2021-11-28 DIAGNOSIS — E43 Unspecified severe protein-calorie malnutrition: Secondary | ICD-10-CM | POA: Diagnosis not present

## 2021-11-28 DIAGNOSIS — I7300 Raynaud's syndrome without gangrene: Secondary | ICD-10-CM | POA: Diagnosis not present

## 2021-11-28 DIAGNOSIS — M6281 Muscle weakness (generalized): Secondary | ICD-10-CM | POA: Diagnosis not present

## 2021-11-28 DIAGNOSIS — S8010XA Contusion of unspecified lower leg, initial encounter: Secondary | ICD-10-CM | POA: Diagnosis not present

## 2021-11-28 DIAGNOSIS — K219 Gastro-esophageal reflux disease without esophagitis: Secondary | ICD-10-CM | POA: Diagnosis not present

## 2021-11-28 DIAGNOSIS — Z86718 Personal history of other venous thrombosis and embolism: Secondary | ICD-10-CM | POA: Diagnosis not present

## 2021-11-28 DIAGNOSIS — S52551A Other extraarticular fracture of lower end of right radius, initial encounter for closed fracture: Secondary | ICD-10-CM | POA: Diagnosis not present

## 2021-11-28 DIAGNOSIS — I428 Other cardiomyopathies: Secondary | ICD-10-CM | POA: Diagnosis not present

## 2021-11-28 DIAGNOSIS — Z8673 Personal history of transient ischemic attack (TIA), and cerebral infarction without residual deficits: Secondary | ICD-10-CM | POA: Diagnosis not present

## 2021-11-28 DIAGNOSIS — I69828 Other speech and language deficits following other cerebrovascular disease: Secondary | ICD-10-CM | POA: Diagnosis not present

## 2021-11-28 DIAGNOSIS — S0083XA Contusion of other part of head, initial encounter: Secondary | ICD-10-CM | POA: Diagnosis not present

## 2021-11-28 DIAGNOSIS — I69891 Dysphagia following other cerebrovascular disease: Secondary | ICD-10-CM | POA: Diagnosis not present

## 2021-11-28 DIAGNOSIS — C50919 Malignant neoplasm of unspecified site of unspecified female breast: Secondary | ICD-10-CM | POA: Diagnosis not present

## 2021-11-28 DIAGNOSIS — I110 Hypertensive heart disease with heart failure: Secondary | ICD-10-CM | POA: Diagnosis not present

## 2021-11-28 DIAGNOSIS — I2699 Other pulmonary embolism without acute cor pulmonale: Secondary | ICD-10-CM | POA: Diagnosis not present

## 2021-11-28 DIAGNOSIS — Z7982 Long term (current) use of aspirin: Secondary | ICD-10-CM | POA: Diagnosis not present

## 2021-11-28 DIAGNOSIS — S0181XA Laceration without foreign body of other part of head, initial encounter: Secondary | ICD-10-CM | POA: Diagnosis not present

## 2021-11-28 DIAGNOSIS — Z743 Need for continuous supervision: Secondary | ICD-10-CM | POA: Diagnosis not present

## 2021-11-28 DIAGNOSIS — S22070D Wedge compression fracture of T9-T10 vertebra, subsequent encounter for fracture with routine healing: Secondary | ICD-10-CM | POA: Diagnosis not present

## 2021-11-28 DIAGNOSIS — R279 Unspecified lack of coordination: Secondary | ICD-10-CM | POA: Diagnosis not present

## 2021-11-28 DIAGNOSIS — Z96652 Presence of left artificial knee joint: Secondary | ICD-10-CM | POA: Diagnosis not present

## 2021-11-28 DIAGNOSIS — S52513D Displaced fracture of unspecified radial styloid process, subsequent encounter for closed fracture with routine healing: Secondary | ICD-10-CM | POA: Diagnosis not present

## 2021-11-28 DIAGNOSIS — I5032 Chronic diastolic (congestive) heart failure: Secondary | ICD-10-CM | POA: Diagnosis not present

## 2021-11-28 DIAGNOSIS — S52501A Unspecified fracture of the lower end of right radius, initial encounter for closed fracture: Secondary | ICD-10-CM | POA: Diagnosis not present

## 2021-11-28 DIAGNOSIS — W19XXXA Unspecified fall, initial encounter: Secondary | ICD-10-CM | POA: Diagnosis not present

## 2021-11-28 DIAGNOSIS — I10 Essential (primary) hypertension: Secondary | ICD-10-CM | POA: Diagnosis not present

## 2021-11-28 DIAGNOSIS — S52501D Unspecified fracture of the lower end of right radius, subsequent encounter for closed fracture with routine healing: Secondary | ICD-10-CM | POA: Diagnosis not present

## 2021-11-28 DIAGNOSIS — S40211A Abrasion of right shoulder, initial encounter: Secondary | ICD-10-CM | POA: Diagnosis not present

## 2021-11-28 DIAGNOSIS — F17210 Nicotine dependence, cigarettes, uncomplicated: Secondary | ICD-10-CM | POA: Diagnosis not present

## 2021-11-28 DIAGNOSIS — M80841S Other osteoporosis with current pathological fracture, right hand, sequela: Secondary | ICD-10-CM | POA: Diagnosis not present

## 2021-11-28 DIAGNOSIS — Z9181 History of falling: Secondary | ICD-10-CM | POA: Diagnosis not present

## 2021-11-28 DIAGNOSIS — Z79899 Other long term (current) drug therapy: Secondary | ICD-10-CM | POA: Diagnosis not present

## 2021-11-28 DIAGNOSIS — D500 Iron deficiency anemia secondary to blood loss (chronic): Secondary | ICD-10-CM | POA: Diagnosis not present

## 2021-11-28 DIAGNOSIS — I5030 Unspecified diastolic (congestive) heart failure: Secondary | ICD-10-CM | POA: Diagnosis not present

## 2021-11-28 DIAGNOSIS — I2510 Atherosclerotic heart disease of native coronary artery without angina pectoris: Secondary | ICD-10-CM | POA: Diagnosis not present

## 2021-11-28 DIAGNOSIS — R2689 Other abnormalities of gait and mobility: Secondary | ICD-10-CM | POA: Diagnosis not present

## 2021-11-28 DIAGNOSIS — Z853 Personal history of malignant neoplasm of breast: Secondary | ICD-10-CM | POA: Diagnosis not present

## 2021-11-28 MED ORDER — AMLODIPINE BESYLATE 5 MG PO TABS
5.0000 mg | ORAL_TABLET | Freq: Every day | ORAL | Status: DC
  Administered 2021-11-28: 5 mg via ORAL
  Filled 2021-11-28: qty 1

## 2021-11-28 MED ORDER — AMLODIPINE BESYLATE 5 MG PO TABS: 5.0000 mg | ORAL_TABLET | Freq: Every day | ORAL | Status: DC

## 2021-11-28 MED ORDER — LORAZEPAM 0.5 MG PO TABS: 0.5000 mg | ORAL_TABLET | Freq: Two times a day (BID) | ORAL | 0 refills | Status: DC | PRN

## 2021-11-28 MED ORDER — HYDROCODONE-ACETAMINOPHEN 5-325 MG PO TABS: 1.0000 | ORAL_TABLET | Freq: Four times a day (QID) | ORAL | 0 refills | Status: AC | PRN

## 2021-11-28 MED ORDER — ACETAMINOPHEN 325 MG PO TABS
ORAL_TABLET | ORAL | 2 refills | Status: DC
  Filled 2021-11-28: qty 100, fill #0

## 2021-11-28 NOTE — Plan of Care (Signed)
Pt reporting pain 5/10.  PRNs administered per MAR.  Problem: Activity: Goal: Risk for activity intolerance will decrease Outcome: Progressing   Problem: Elimination: Goal: Will not experience complications related to bowel motility Outcome: Progressing   Problem: Pain Managment: Goal: General experience of comfort will improve Outcome: Progressing

## 2021-11-28 NOTE — Progress Notes (Signed)
RN attempted to call report to receiving facility ([facility]) x2.  Facility did not answer.

## 2021-11-28 NOTE — TOC Progression Note (Signed)
Transition of Care [facility]) - Progression Note    Patient Details  Name: [name] MRN: [id number] Date of Birth: Dec 02, 1929  Transition of Care [facility]) CM/SW Contact  Joaquin Courts, RN Phone Number: 11/28/2021, 10:39 AM  Clinical Narrative:    CM discussed bed offers with patient, who selects [facility] for SNF rehab.  CM outreached to facility and left VM for admissions rep to secure a bed, insurance authorization initiated.    Expected Discharge Plan: Skilled Nursing Facility Barriers to Discharge: Insurance Authorization  Expected Discharge Plan and Services Expected Discharge Plan: [place]       Living arrangements for the past 2 months: [facility] Expected Discharge Date: 11/28/21                                     Social Determinants of Health (SDOH) Interventions    Readmission Risk Interventions    09/14/2019   11:11 AM  Readmission Risk Prevention Plan  Medication Screening Complete  Transportation Screening Complete

## 2021-11-28 NOTE — Care Management Obs Status (Signed)
[facility] NOTIFICATION   Patient Details  Name: DANICE DIPPOLITO MRN: [id number] Date of Birth: April 29, 1930   Medicare Observation Status Notification Given:  Yes    Joaquin Courts, RN 11/28/2021, 10:39 AM

## 2021-11-28 NOTE — Consult Note (Signed)
WOC Nurse Consult Note: Reason for Consult:skin tears from fall to right shoulder (3, full thickness) and chin (full thickness) Wound type:trauma Pressure Injury POA:N/A Wound bed:red, clot at chin that will need to be soaked to remove remaining gauze threads Drainage (amount, consistency, odor) serosanguinous Periwound: Dressing procedure/placement/frequency: Orthopedics evaluated yesterday evening. Nursing is seeking guidance for topical care from [facility] Nurse. Recommend saline cleanse followed by placement of antimicrobial nonadherent dressing (xeroform). These can be topped with a dry gauze and secured using silicone bordered foam dressings for atraumatic removal.   Follow up as directed with orthopedic MD.  [facility] team will not follow, but will remain available to this patient, the nursing and medical teams.  Please re-consult if needed.  Thank you for inviting [place] to participate in this patient's Plan of Care.  Maudie Flakes, MSN, RN, CNS, [place], Serita Grammes, [employer], [employer] phone:  [phone]

## 2021-11-28 NOTE — TOC Transition Note (Signed)
Transition of Care [facility]) - CM/SW Discharge Note   Patient Details  Name: [name] MRN: [id number] Date of Birth: 1930/04/20  Transition of Care (TOC) CM/SW Contact:  Joaquin Courts, RN Phone Number: 11/28/2021, 3:10 PM   Clinical Narrative: CM confirms bed availability and insurance authorization.  Patient will discharge today to [facility], report number [phone].  Patient has been updated.  DC summary faxed to facility and [facility] transport arranged.        Final next level of care: Skilled Nursing Facility Barriers to Discharge: Insurance Authorization   Patient Goals and CMS Choice Patient states their goals for this hospitalization and ongoing recovery are:: To go to SNF for rehab, then return to [place] at [facility]                                     Social Determinants of Health (SDOH) Interventions     Readmission Risk Interventions    09/14/2019   11:11 AM  Readmission Risk Prevention Plan  Medication Screening Complete  Transportation Screening Complete

## 2021-11-28 NOTE — Discharge Summary (Signed)
Physician Discharge Summary  [name] IRW:[id number] DOB: 1929-11-06 DOA: 11/24/2021  PCP: Colon Branch, MD  Admit date: 11/24/2021 Discharge date: 11/28/2021 Admitted From: ALF Disposition: SNF Recommendations for Outpatient Follow-up:  Follow up with orthopedic surgery in 1 week as below Check CMP and CBC in 1 week Please follow up on the following pending results: None   Discharge Condition: Stable CODE STATUS: Full code  Follow-up Information     Marchia Bond, MD. Schedule an appointment as soon as possible for a visit in 1 week(s).   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  Head, cervical spine and maxillofacial CT without significant finding.  Right shoulder, right femur, bilateral pelvic and right hip x-ray negative for fractures or dislocation.  Right wrist x-ray showed impacted extra-articular fracture of distal radius.  She also had sustained bruising to her right forehead and right shoulder as well as a skin laceration to the chin.  Orthopedic surgery consulted for right distal radial fracture, and recommended nonweightbearing with the right upper extremity until follow-up with orthopedic surgery in 1 week for repeat x-ray and possible conversion to cast.  Per orthopedic surgery, she can use a platform walker with weightbearing through the elbow on the right.   Therapy recommended SNF.  See individual problem list below for more.   Problems addressed during this hospitalization Principal Problem:   Fall Active Problems:   Essential hypertension   Chronic diastolic CHF (congestive heart failure) (HCC)   Closed right  radial fracture   Chin laceration   Shoulder abrasion, right, initial encounter   Supraorbital ecchymosis   Accidental fall at ALF/frequent falls: Reportedly 4 falls in the last 2 years, mainly due to not using a walker Right wrist fracture due to accidental fall -Per Ortho, NWB with the RUE but can use a platform walker with weightbearing through the elbow.  -Outpatient follow-up 1 week with x-rays and will likely need to converted to a cast at the time.   -Schedule Tylenol with as needed Norco for pain control -Bowel regimen   Right supraorbital ecchymosis: No issues with vision. Shoulder abrasion, right, initial encounter Chin laceration-POA -Daily dressing with xeroform    Chronic diastolic CHF: Stable Essential hypertension: Stable -Continue home metoprolol, Lasix -Added low-dose amlodipine.   CAD/stroke/HLD -Continue aspirin.  History of statin intolerance   Osteoporosis/osteopenia -On Fosamax 70 mg every Sunday as an outpatient  Vital signs Vitals:   11/27/21 1943 11/27/21 2115 11/28/21 0445 11/28/21 1036  BP: 140/79 (!) 154/80 (!) 167/91 139/70  Pulse: 80 79 78 86  Temp: 97.8 F (36.6 C)  98 F (36.7 C)   Resp: 16  14   Height:      Weight:      SpO2: 97%  93%   TempSrc: Oral  Oral   BMI (Calculated):         Discharge exam  GENERAL: Appears frail.  Nontoxic. HEENT: MMM.  Vision and hearing grossly intact.  NECK: Supple.  No apparent JVD.  RESP:  No IWOB.  Fair aeration bilaterally. CVS:  RRR. Heart sounds normal.  ABD/GI/GU: BS+. Abd soft, NTND.  MSK/EXT:  Moves extremities.  RUE in splint.  Neurovascular intact distally SKIN: Bruising over right forehead, skin laceration over right shoulder and chin  NEURO: Awake and alert. Oriented appropriately.  No apparent focal neuro deficit. PSYCH: Calm. Normal affect.   Discharge Instructions Discharge Instructions     Diet general   Complete by: As directed    Increase activity slowly   Complete by: As  directed       Allergies as of 11/28/2021       Reactions   Penicillins Hives, Rash, Other (See Comments)   Has patient had a PCN reaction causing immediate rash, facial/tongue/throat swelling, SOB or lightheadedness with hypotension: Yes Has patient had a PCN reaction causing severe rash involving mucus membranes or skin necrosis: yes Has patient had a PCN reaction that required hospitalization: No Has patient had a PCN reaction occurring within the last 10 years: no If all of the above answers are "NO", then may proceed with Cephalosporin use. Other Reaction: OTHER REACTION   Valsartan Itching, Other (See Comments)   Atorvastatin Diarrhea   Carvedilol Other (See Comments)   "teeth hurt when I took it"   Ezetimibe Nausea Only   Fluvastatin Sodium Other (See Comments)   "Can't remember"   Magnesium Hydroxide Other (See Comments)   REACTION: triggers HAs   Meloxicam Other (See Comments)   Pt seeing auro's / spots.     Pneumovax [pneumococcal Polysaccharide Vaccine] Other (See Comments)   Local reaction   Quinapril Hcl Other (See Comments)    "feelings of tiredness"   Simvastatin Diarrhea   Topamax [topiramate] Itching   Vit D-vit E-safflower Oil Other (See Comments)   Headaches        Medication List     STOP taking these medications    traMADol 50 MG tablet Commonly known as: ULTRAM       TAKE these medications    acetaminophen 325 MG tablet Commonly known as: Tylenol Take 2 tablets (650 mg total) by mouth every 8 (eight) hours for 5 days, THEN 2 tablets (650 mg total) every 6 (six) hours as needed. Start taking on: November 28, 2021   alendronate 70 MG tablet Commonly known as: FOSAMAX TAKE 1 TABLET(70 MG) BY MOUTH EVERY 7 DAYS WITH A FULL GLASS OF WATER AND ON AN EMPTY STOMACH What changed: See the new instructions.   amLODipine 5 MG tablet Commonly known as: NORVASC Take 1 tablet (5 mg total) by mouth daily. Start taking on: November 29, 2021   aspirin EC  81 MG tablet Take 81 mg by mouth daily. Swallow whole.   furosemide 20 MG tablet Commonly known as: LASIX Take 1 tablet (20 mg total) by mouth every other day.   HYDROcodone-acetaminophen 5-325 MG tablet Commonly known as: NORCO/VICODIN Take 1 tablet by mouth every 6 (six) hours as needed for up to 5 days for moderate pain or severe pain.   LORazepam 0.5 MG tablet Commonly known as: ATIVAN Take 1 tablet (0.5 mg total) by mouth 2 (two) times daily as needed for anxiety.   metoprolol tartrate 50 MG tablet Commonly known as: LOPRESSOR TAKE 1 TABLET(50 MG) BY MOUTH TWICE DAILY What changed: See the new instructions.   nitroGLYCERIN 0.4 MG SL tablet Commonly known as: NITROSTAT Place 1 tablet (0.4 mg total) under the tongue every 5 (five) minutes x 3 doses as needed for chest pain.   omeprazole 20 MG capsule Commonly known as: PRILOSEC Take 1 capsule (20 mg total) by mouth  daily.   valACYclovir 500 MG tablet Commonly known as: VALTREX TAKE 2 TABLETS BY MOUTH AT ONSET OF RASH AND 2 TABLETS 12 HOURS LATER AS NEEDED        Consultations: Orthopedic surgery  Procedures/Studies:   DG Femur Portable 1 View Right  Result Date: 11/25/2021 CLINICAL DATA:  [place] earlier today with continued right lower extremity pain. EXAM: RIGHT FEMUR PORTABLE 1 VIEW COMPARISON:  AP pelvis and right hip views earlier today. FINDINGS: Osteopenia. There is no evidence of fractures. There is mild nonerosive femorotibial arthrosis. There is patchy calcification in the femoral artery. IMPRESSION: Osteopenia and degenerative change without evidence of fractures. Electronically Signed   By: Telford Nab M.D.   On: 11/25/2021 00:10   DG Hip Unilat W or Wo Pelvis 2-3 Views Right  Result Date: 11/24/2021 CLINICAL DATA:  Fall, right hip pain EXAM: DG HIP (WITH OR WITHOUT PELVIS) 2-3V RIGHT COMPARISON:  None Available. FINDINGS: Left hip ORIF has been performed. Normal alignment. No acute fracture or  dislocation. Mild bilateral hip degenerative arthritis noted with joint space narrowing. Vascular calcifications are seen within the medial thighs bilaterally. IMPRESSION: No acute fracture or dislocation. Electronically Signed   By: Fidela Salisbury M.D.   On: 11/24/2021 23:01   CT Head Wo Contrast  Result Date: 11/24/2021 CLINICAL DATA:  Trauma. EXAM: CT HEAD WITHOUT CONTRAST CT MAXILLOFACIAL WITHOUT CONTRAST CT CERVICAL SPINE WITHOUT CONTRAST TECHNIQUE: Multidetector CT imaging of the head, cervical spine, and maxillofacial structures were performed using the standard protocol without intravenous contrast. Multiplanar CT image reconstructions of the cervical spine and maxillofacial structures were also generated. RADIATION DOSE REDUCTION: This exam was performed according to the departmental dose-optimization program which includes automated exposure control, adjustment of the mA and/or kV according to patient size and/or use of iterative reconstruction technique. COMPARISON:  Brain MRI dated 10/08/2018 and CT dated 10/07/2021. FINDINGS: CT HEAD FINDINGS Brain: Moderate age-related atrophy and chronic microvascular ischemic changes. Old right thalamic lacunar infarct. There is no acute intracranial hemorrhage. No mass effect or midline shift. No extra-axial fluid collection. Vascular: No hyperdense vessel or unexpected calcification. Skull: Normal. Negative for fracture or focal lesion. Other: Small right forehead hematoma. CT MAXILLOFACIAL FINDINGS Osseous: No acute fracture or subluxation. Orbits: Negative. No traumatic or inflammatory finding. Sinuses: Clear. Soft tissues: There is soft tissue swelling and thickening of the tip of the nose. Right supraorbital hematoma. CT CERVICAL SPINE FINDINGS Alignment: No acute subluxation. Skull base and vertebrae: No acute fracture.  Osteopenia. Soft tissues and spinal canal: No prevertebral fluid or swelling. No visible canal hematoma. Disc levels:  No acute findings.   Mild degenerative changes. Upper chest: Negative. Other: Bilateral carotid bulb calcified plaques. IMPRESSION: 1. No acute intracranial pathology. Moderate age-related atrophy and chronic microvascular ischemic changes. 2. No acute facial bone fractures. 3. No acute cervical spine pathology. Electronically Signed   By: Anner Crete M.D.   On: 11/24/2021 21:40   CT Maxillofacial Wo Contrast  Result Date: 11/24/2021 CLINICAL DATA:  Trauma. EXAM: CT HEAD WITHOUT CONTRAST CT MAXILLOFACIAL WITHOUT CONTRAST CT CERVICAL SPINE WITHOUT CONTRAST TECHNIQUE: Multidetector CT imaging of the head, cervical spine, and maxillofacial structures were performed using the standard protocol without intravenous contrast. Multiplanar CT image reconstructions of the cervical spine and maxillofacial structures were also generated. RADIATION DOSE REDUCTION: This exam was performed according to the departmental dose-optimization program which includes automated exposure control, adjustment of the mA and/or kV according to patient size and/or use of iterative reconstruction technique.  COMPARISON:  Brain MRI dated 10/08/2018 and CT dated 10/07/2021. FINDINGS: CT HEAD FINDINGS Brain: Moderate age-related atrophy and chronic microvascular ischemic changes. Old right thalamic lacunar infarct. There is no acute intracranial hemorrhage. No mass effect or midline shift. No extra-axial fluid collection. Vascular: No hyperdense vessel or unexpected calcification. Skull: Normal. Negative for fracture or focal lesion. Other: Small right forehead hematoma. CT MAXILLOFACIAL FINDINGS Osseous: No acute fracture or subluxation. Orbits: Negative. No traumatic or inflammatory finding. Sinuses: Clear. Soft tissues: There is soft tissue swelling and thickening of the tip of the nose. Right supraorbital hematoma. CT CERVICAL SPINE FINDINGS Alignment: No acute subluxation. Skull base and vertebrae: No acute fracture.  Osteopenia. Soft tissues and spinal  canal: No prevertebral fluid or swelling. No visible canal hematoma. Disc levels:  No acute findings.  Mild degenerative changes. Upper chest: Negative. Other: Bilateral carotid bulb calcified plaques. IMPRESSION: 1. No acute intracranial pathology. Moderate age-related atrophy and chronic microvascular ischemic changes. 2. No acute facial bone fractures. 3. No acute cervical spine pathology. Electronically Signed   By: Anner Crete M.D.   On: 11/24/2021 21:40   CT Cervical Spine Wo Contrast  Result Date: 11/24/2021 CLINICAL DATA:  Trauma. EXAM: CT HEAD WITHOUT CONTRAST CT MAXILLOFACIAL WITHOUT CONTRAST CT CERVICAL SPINE WITHOUT CONTRAST TECHNIQUE: Multidetector CT imaging of the head, cervical spine, and maxillofacial structures were performed using the standard protocol without intravenous contrast. Multiplanar CT image reconstructions of the cervical spine and maxillofacial structures were also generated. RADIATION DOSE REDUCTION: This exam was performed according to the departmental dose-optimization program which includes automated exposure control, adjustment of the mA and/or kV according to patient size and/or use of iterative reconstruction technique. COMPARISON:  Brain MRI dated 10/08/2018 and CT dated 10/07/2021. FINDINGS: CT HEAD FINDINGS Brain: Moderate age-related atrophy and chronic microvascular ischemic changes. Old right thalamic lacunar infarct. There is no acute intracranial hemorrhage. No mass effect or midline shift. No extra-axial fluid collection. Vascular: No hyperdense vessel or unexpected calcification. Skull: Normal. Negative for fracture or focal lesion. Other: Small right forehead hematoma. CT MAXILLOFACIAL FINDINGS Osseous: No acute fracture or subluxation. Orbits: Negative. No traumatic or inflammatory finding. Sinuses: Clear. Soft tissues: There is soft tissue swelling and thickening of the tip of the nose. Right supraorbital hematoma. CT CERVICAL SPINE FINDINGS Alignment: No  acute subluxation. Skull base and vertebrae: No acute fracture.  Osteopenia. Soft tissues and spinal canal: No prevertebral fluid or swelling. No visible canal hematoma. Disc levels:  No acute findings.  Mild degenerative changes. Upper chest: Negative. Other: Bilateral carotid bulb calcified plaques. IMPRESSION: 1. No acute intracranial pathology. Moderate age-related atrophy and chronic microvascular ischemic changes. 2. No acute facial bone fractures. 3. No acute cervical spine pathology. Electronically Signed   By: Anner Crete M.D.   On: 11/24/2021 21:40   DG Wrist Complete Right  Result Date: 11/24/2021 CLINICAL DATA:  [place], right wrist pain EXAM: RIGHT WRIST - COMPLETE 3+ VIEW COMPARISON:  None Available. FINDINGS: Frontal, oblique, lateral, and ulnar deviated views of the right wrist are obtained. There is a minimally displaced and impacted extra-articular fracture of the distal right radius, best seen on the oblique projection. No other acute displaced fractures. Severe osteoarthritis within the radial aspect of the carpus and first carpometacarpal joint. Mild diffuse soft tissue swelling. IMPRESSION: 1. Minimally impacted extra-articular fracture of the distal right radius. 2. 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 Electronically Signed   By: Randa Ngo M.D.   On: 11/24/2021 21:37  DG Shoulder Right  Result Date: 11/24/2021 CLINICAL DATA:  [place], hit head, right shoulder laceration EXAM: RIGHT SHOULDER - 2+ VIEW COMPARISON:  None Available. FINDINGS: Internal rotation, external rotation, transscapular views are obtained. No acute fracture, subluxation, or dislocation. Moderate hypertrophic changes and spurring are seen at the acromioclavicular joint. There is mild glenohumeral joint space narrowing. Visualized portions of the right chest are clear. IMPRESSION: 1. Degenerative changes.  No acute fracture. Electronically Signed   By: Randa Ngo M.D.   On: 11/24/2021  21:20       The results of significant diagnostics from this hospitalization (including imaging, microbiology, ancillary and laboratory) are listed below for reference.     Microbiology: No results found for this or any previous visit (from the past 240 hour(s)).   Labs:  CBC: Recent Labs  Lab 11/24/21 2338 11/25/21 0549 11/26/21 0514  WBC 11.7* 12.7* 7.6  NEUTROABS 10.1*  --  5.7  HGB 13.5 14.2 13.1  HCT 42.2 45.1 41.3  MCV 96.6 97.6 97.2  PLT 391 428* 373   BMP &GFR Recent Labs  Lab 11/24/21 2338 11/25/21 0549  NA 138 136  K 3.0* 4.3  CL 110 101  CO2 23 30  GLUCOSE 119* 137*  BUN 12 15  CREATININE 0.36* 0.69  CALCIUM 6.8* 8.9   Estimated Creatinine Clearance: 39 mL/min (by C-G formula based on SCr of 0.69 mg/dL). Liver & Pancreas: No results for input(s): "AST", "ALT", "ALKPHOS", "BILITOT", "PROT", "ALBUMIN" in the last 168 hours. No results for input(s): "LIPASE", "AMYLASE" in the last 168 hours. No results for input(s): "AMMONIA" in the last 168 hours. Diabetic: No results for input(s): "HGBA1C" in the last 72 hours. No results for input(s): "GLUCAP" in the last 168 hours. Cardiac Enzymes: No results for input(s): "CKTOTAL", "CKMB", "CKMBINDEX", "TROPONINI" in the last 168 hours. Recent Labs    03/22/21 1453  PROBNP 704   Coagulation Profile: No results for input(s): "INR", "PROTIME" in the last 168 hours. Thyroid Function Tests: No results for input(s): "TSH", "T4TOTAL", "FREET4", "T3FREE", "THYROIDAB" in the last 72 hours. Lipid Profile: No results for input(s): "CHOL", "HDL", "LDLCALC", "TRIG", "CHOLHDL", "LDLDIRECT" in the last 72 hours. Anemia Panel: No results for input(s): "VITAMINB12", "FOLATE", "FERRITIN", "TIBC", "IRON", "RETICCTPCT" in the last 72 hours. Urine analysis:    Component Value Date/Time   COLORURINE AMBER (A) 10/07/2021 1212   APPEARANCEUR HAZY (A) 10/07/2021 1212   LABSPEC 1.025 10/07/2021 1212   LABSPEC 1.010 05/27/2013  1433   PHURINE 5.0 10/07/2021 1212   GLUCOSEU NEGATIVE 10/07/2021 1212   GLUCOSEU NEGATIVE 09/02/2017 1347   HGBUR NEGATIVE 10/07/2021 1212   HGBUR negative 10/06/2009 1015   BILIRUBINUR NEGATIVE 10/07/2021 1212   BILIRUBINUR Negative 01/21/2018 1618   KETONESUR 20 (A) 10/07/2021 1212   PROTEINUR 30 (A) 10/07/2021 1212   UROBILINOGEN negative (A) 01/21/2018 1618   UROBILINOGEN 0.2 09/02/2017 1347   UROBILINOGEN 0.2 05/27/2013 1433   NITRITE NEGATIVE 10/07/2021 1212   LEUKOCYTESUR NEGATIVE 10/07/2021 1212   Sepsis Labs: Invalid input(s): "PROCALCITONIN", "LACTICIDVEN"   SIGNED:  Mercy Riding, MD  [facility] 11/28/2021, 12:09 PM

## 2021-11-29 ENCOUNTER — Other Ambulatory Visit: Payer: Self-pay | Admitting: *Deleted

## 2021-11-29 DIAGNOSIS — M80841S Other osteoporosis with current pathological fracture, right hand, sequela: Secondary | ICD-10-CM | POA: Diagnosis not present

## 2021-11-29 DIAGNOSIS — K219 Gastro-esophageal reflux disease without esophagitis: Secondary | ICD-10-CM | POA: Diagnosis not present

## 2021-11-29 DIAGNOSIS — S52513D Displaced fracture of unspecified radial styloid process, subsequent encounter for closed fracture with routine healing: Secondary | ICD-10-CM | POA: Diagnosis not present

## 2021-11-29 DIAGNOSIS — M6281 Muscle weakness (generalized): Secondary | ICD-10-CM | POA: Diagnosis not present

## 2021-11-29 NOTE — Patient Outreach (Signed)
Per [place] eligible member currently resides in  [facility].  Screening  for potential care management/care coordinations services as a benefit of Mrs. Kesecker's insurance plan and PCP with [facility] SW.  Mrs Mian admitted to SNF on 7/18 after hospitalization.  Facility site visit to [facility] skilled nursing facility. Met with [name], SNF SW. Mrs. L[place] is from [employer]. Anticipated transition plan is to return to ILF.  Spoke with Mrs. Jenean Lindau in room at [facility] to discuss transition plans and [facility] services. Mrs. Jenean Lindau Dance movement psychotherapist to contact daughter [name].  Confirmed PCP is Dr. Larose Kells with [facility] SW.  Provided [facility] Care Management brochure, 24-hr nurse advice line magnet, and writer's contact information.   Will plan follow up with Mrs. Wynter's daughter Early Chars to discuss potential [facility] services.  [name], MSN, RN,BSN [place] Acute Care Coordinator [phone] [facility]) [phone]  (Toll free office)

## 2021-11-30 DIAGNOSIS — D500 Iron deficiency anemia secondary to blood loss (chronic): Secondary | ICD-10-CM | POA: Diagnosis not present

## 2021-11-30 DIAGNOSIS — I5032 Chronic diastolic (congestive) heart failure: Secondary | ICD-10-CM | POA: Diagnosis not present

## 2021-11-30 DIAGNOSIS — M6281 Muscle weakness (generalized): Secondary | ICD-10-CM | POA: Diagnosis not present

## 2021-11-30 DIAGNOSIS — I69891 Dysphagia following other cerebrovascular disease: Secondary | ICD-10-CM | POA: Diagnosis not present

## 2021-11-30 DIAGNOSIS — Z9181 History of falling: Secondary | ICD-10-CM | POA: Diagnosis not present

## 2021-11-30 DIAGNOSIS — E43 Unspecified severe protein-calorie malnutrition: Secondary | ICD-10-CM | POA: Diagnosis not present

## 2021-11-30 DIAGNOSIS — S52591D Other fractures of lower end of right radius, subsequent encounter for closed fracture with routine healing: Secondary | ICD-10-CM | POA: Diagnosis not present

## 2021-12-04 DIAGNOSIS — E43 Unspecified severe protein-calorie malnutrition: Secondary | ICD-10-CM | POA: Diagnosis not present

## 2021-12-04 DIAGNOSIS — Z9181 History of falling: Secondary | ICD-10-CM | POA: Diagnosis not present

## 2021-12-04 DIAGNOSIS — I69891 Dysphagia following other cerebrovascular disease: Secondary | ICD-10-CM | POA: Diagnosis not present

## 2021-12-04 DIAGNOSIS — I5032 Chronic diastolic (congestive) heart failure: Secondary | ICD-10-CM | POA: Diagnosis not present

## 2021-12-04 DIAGNOSIS — M6281 Muscle weakness (generalized): Secondary | ICD-10-CM | POA: Diagnosis not present

## 2021-12-04 DIAGNOSIS — S22070D Wedge compression fracture of T9-T10 vertebra, subsequent encounter for fracture with routine healing: Secondary | ICD-10-CM | POA: Diagnosis not present

## 2021-12-04 DIAGNOSIS — I2510 Atherosclerotic heart disease of native coronary artery without angina pectoris: Secondary | ICD-10-CM | POA: Diagnosis not present

## 2021-12-04 DIAGNOSIS — D500 Iron deficiency anemia secondary to blood loss (chronic): Secondary | ICD-10-CM | POA: Diagnosis not present

## 2021-12-04 DIAGNOSIS — S52591D Other fractures of lower end of right radius, subsequent encounter for closed fracture with routine healing: Secondary | ICD-10-CM | POA: Diagnosis not present

## 2021-12-05 DIAGNOSIS — M80841S Other osteoporosis with current pathological fracture, right hand, sequela: Secondary | ICD-10-CM | POA: Diagnosis not present

## 2021-12-05 DIAGNOSIS — S52513D Displaced fracture of unspecified radial styloid process, subsequent encounter for closed fracture with routine healing: Secondary | ICD-10-CM | POA: Diagnosis not present

## 2021-12-05 DIAGNOSIS — K219 Gastro-esophageal reflux disease without esophagitis: Secondary | ICD-10-CM | POA: Diagnosis not present

## 2021-12-06 ENCOUNTER — Other Ambulatory Visit: Payer: Self-pay | Admitting: *Deleted

## 2021-12-06 DIAGNOSIS — S52501D Unspecified fracture of the lower end of right radius, subsequent encounter for closed fracture with routine healing: Secondary | ICD-10-CM | POA: Diagnosis not present

## 2021-12-06 NOTE — Chronic Care Management (AMB) (Signed)
  Chronic Care Management Note  12/06/2021 Name: [name] MRN: [id number] DOB: Sep 08, 1929  [name] is a 88 y.o. year old female who is a primary care patient of Colon Branch, MD and is actively engaged with the care management team. I reached out to Dorris Carnes by phone today to assist with re-scheduling a follow up visit with the Pharmacist  Follow up plan: We have been unable to make contact with the patient for follow up. The care management team is available to follow up with the patient after provider conversation with the patient regarding recommendation for care management engagement and subsequent re-referral to the care management team.   Julian Hy, [place] Direct Dial: [phone]

## 2021-12-06 NOTE — Patient Outreach (Signed)
[facility] Post- Acute Care Coordinator follow up. Per [place] eligible member currently resides in  [facility].  Screening for potential [facility] care coordination/care management services as a benefit of [employer] plan and PCP.  Facility site visit to [facility] skilled nursing facility. Met with [name], SNF SW.  [name] continues to participate with therapy. Writer to reach out to daughter to discuss potential [facility] needs.   Will continue to follow.    Dawn Rolling, MSN, [name],[name] [place] Acute Care Coordinator [phone] [facility]) [phone]  (Toll free office)

## 2021-12-07 DIAGNOSIS — I5032 Chronic diastolic (congestive) heart failure: Secondary | ICD-10-CM | POA: Diagnosis not present

## 2021-12-07 DIAGNOSIS — Z9181 History of falling: Secondary | ICD-10-CM | POA: Diagnosis not present

## 2021-12-07 DIAGNOSIS — S52591D Other fractures of lower end of right radius, subsequent encounter for closed fracture with routine healing: Secondary | ICD-10-CM | POA: Diagnosis not present

## 2021-12-07 DIAGNOSIS — D500 Iron deficiency anemia secondary to blood loss (chronic): Secondary | ICD-10-CM | POA: Diagnosis not present

## 2021-12-07 DIAGNOSIS — M6281 Muscle weakness (generalized): Secondary | ICD-10-CM | POA: Diagnosis not present

## 2021-12-07 DIAGNOSIS — I69891 Dysphagia following other cerebrovascular disease: Secondary | ICD-10-CM | POA: Diagnosis not present

## 2021-12-07 DIAGNOSIS — E43 Unspecified severe protein-calorie malnutrition: Secondary | ICD-10-CM | POA: Diagnosis not present

## 2021-12-08 DIAGNOSIS — K219 Gastro-esophageal reflux disease without esophagitis: Secondary | ICD-10-CM | POA: Diagnosis not present

## 2021-12-08 DIAGNOSIS — I5030 Unspecified diastolic (congestive) heart failure: Secondary | ICD-10-CM | POA: Diagnosis not present

## 2021-12-08 DIAGNOSIS — I10 Essential (primary) hypertension: Secondary | ICD-10-CM | POA: Diagnosis not present

## 2021-12-11 DIAGNOSIS — Z9181 History of falling: Secondary | ICD-10-CM | POA: Diagnosis not present

## 2021-12-11 DIAGNOSIS — E43 Unspecified severe protein-calorie malnutrition: Secondary | ICD-10-CM | POA: Diagnosis not present

## 2021-12-11 DIAGNOSIS — I69891 Dysphagia following other cerebrovascular disease: Secondary | ICD-10-CM | POA: Diagnosis not present

## 2021-12-11 DIAGNOSIS — M6281 Muscle weakness (generalized): Secondary | ICD-10-CM | POA: Diagnosis not present

## 2021-12-11 DIAGNOSIS — I5032 Chronic diastolic (congestive) heart failure: Secondary | ICD-10-CM | POA: Diagnosis not present

## 2021-12-11 DIAGNOSIS — S52591D Other fractures of lower end of right radius, subsequent encounter for closed fracture with routine healing: Secondary | ICD-10-CM | POA: Diagnosis not present

## 2021-12-11 DIAGNOSIS — D500 Iron deficiency anemia secondary to blood loss (chronic): Secondary | ICD-10-CM | POA: Diagnosis not present

## 2021-12-13 ENCOUNTER — Other Ambulatory Visit: Payer: Self-pay | Admitting: *Deleted

## 2021-12-13 DIAGNOSIS — S8010XA Contusion of unspecified lower leg, initial encounter: Secondary | ICD-10-CM | POA: Diagnosis not present

## 2021-12-13 NOTE — Patient Outreach (Signed)
[facility]N Post- Acute Care Coordinator follow up. Mrs. Elmes currently resides in [facility]. Screening for [facility] care management/care coordination services as a benefit for Mrs. Nelms's insurance plan and PCP.  Facility site visit to [facility] skilled nursing facility. Met with [name], SNF SW who reports Mrs. Feltz's anticipated transition date is Saturday, August 5th. States daughter has been on vacation. [name] is scheduled to speak with daughter on tomorrow 12/14/21 regarding transition date/plans.   Will continue to follow. Will plan outreach to daughter Early Chars to discuss potential [facility] needs upon SNF discharge.   [name], MSN, RN,BSN [place] Acute Care Coordinator [phone] [facility]) [phone]  (Toll free office)

## 2021-12-14 DIAGNOSIS — S52591D Other fractures of lower end of right radius, subsequent encounter for closed fracture with routine healing: Secondary | ICD-10-CM | POA: Diagnosis not present

## 2021-12-14 DIAGNOSIS — I10 Essential (primary) hypertension: Secondary | ICD-10-CM | POA: Diagnosis not present

## 2021-12-14 DIAGNOSIS — E43 Unspecified severe protein-calorie malnutrition: Secondary | ICD-10-CM | POA: Diagnosis not present

## 2021-12-14 DIAGNOSIS — I69891 Dysphagia following other cerebrovascular disease: Secondary | ICD-10-CM | POA: Diagnosis not present

## 2021-12-14 DIAGNOSIS — Z9181 History of falling: Secondary | ICD-10-CM | POA: Diagnosis not present

## 2021-12-14 DIAGNOSIS — D500 Iron deficiency anemia secondary to blood loss (chronic): Secondary | ICD-10-CM | POA: Diagnosis not present

## 2021-12-14 DIAGNOSIS — M6281 Muscle weakness (generalized): Secondary | ICD-10-CM | POA: Diagnosis not present

## 2021-12-14 DIAGNOSIS — I5032 Chronic diastolic (congestive) heart failure: Secondary | ICD-10-CM | POA: Diagnosis not present

## 2021-12-18 DIAGNOSIS — I69891 Dysphagia following other cerebrovascular disease: Secondary | ICD-10-CM | POA: Diagnosis not present

## 2021-12-18 DIAGNOSIS — S52591D Other fractures of lower end of right radius, subsequent encounter for closed fracture with routine healing: Secondary | ICD-10-CM | POA: Diagnosis not present

## 2021-12-18 DIAGNOSIS — S22070D Wedge compression fracture of T9-T10 vertebra, subsequent encounter for fracture with routine healing: Secondary | ICD-10-CM | POA: Diagnosis not present

## 2021-12-18 DIAGNOSIS — I5032 Chronic diastolic (congestive) heart failure: Secondary | ICD-10-CM | POA: Diagnosis not present

## 2021-12-18 DIAGNOSIS — M6281 Muscle weakness (generalized): Secondary | ICD-10-CM | POA: Diagnosis not present

## 2021-12-18 DIAGNOSIS — D500 Iron deficiency anemia secondary to blood loss (chronic): Secondary | ICD-10-CM | POA: Diagnosis not present

## 2021-12-18 DIAGNOSIS — E43 Unspecified severe protein-calorie malnutrition: Secondary | ICD-10-CM | POA: Diagnosis not present

## 2021-12-18 DIAGNOSIS — Z9181 History of falling: Secondary | ICD-10-CM | POA: Diagnosis not present

## 2021-12-19 ENCOUNTER — Ambulatory Visit (INDEPENDENT_AMBULATORY_CARE_PROVIDER_SITE_OTHER): Payer: Medicare Other | Admitting: Internal Medicine

## 2021-12-19 ENCOUNTER — Other Ambulatory Visit: Payer: Self-pay | Admitting: *Deleted

## 2021-12-19 ENCOUNTER — Encounter: Payer: Self-pay | Admitting: Internal Medicine

## 2021-12-19 VITALS — BP 116/62 | HR 75 | Temp 97.7°F | Resp 18 | Ht 66.0 in | Wt 113.5 lb

## 2021-12-19 DIAGNOSIS — W19XXXD Unspecified fall, subsequent encounter: Secondary | ICD-10-CM | POA: Diagnosis not present

## 2021-12-19 DIAGNOSIS — S0083XA Contusion of other part of head, initial encounter: Secondary | ICD-10-CM | POA: Diagnosis not present

## 2021-12-19 DIAGNOSIS — G4700 Insomnia, unspecified: Secondary | ICD-10-CM | POA: Diagnosis not present

## 2021-12-19 DIAGNOSIS — I10 Essential (primary) hypertension: Secondary | ICD-10-CM

## 2021-12-19 DIAGNOSIS — M6281 Muscle weakness (generalized): Secondary | ICD-10-CM | POA: Diagnosis not present

## 2021-12-19 DIAGNOSIS — S52591D Other fractures of lower end of right radius, subsequent encounter for closed fracture with routine healing: Secondary | ICD-10-CM | POA: Diagnosis not present

## 2021-12-19 DIAGNOSIS — Z79899 Other long term (current) drug therapy: Secondary | ICD-10-CM | POA: Diagnosis not present

## 2021-12-19 DIAGNOSIS — Z9181 History of falling: Secondary | ICD-10-CM | POA: Diagnosis not present

## 2021-12-19 DIAGNOSIS — R2689 Other abnormalities of gait and mobility: Secondary | ICD-10-CM | POA: Diagnosis not present

## 2021-12-19 MED ORDER — FUROSEMIDE 20 MG PO TABS: 20.0000 mg | ORAL_TABLET | ORAL | 1 refills | Status: DC

## 2021-12-19 MED ORDER — OMEPRAZOLE 20 MG PO CPDR: 20.0000 mg | DELAYED_RELEASE_CAPSULE | Freq: Every day | ORAL | 1 refills | Status: DC

## 2021-12-19 MED ORDER — LORAZEPAM 0.5 MG PO TABS: 0.5000 mg | ORAL_TABLET | Freq: Two times a day (BID) | ORAL | 0 refills | Status: DC | PRN

## 2021-12-19 MED ORDER — METOPROLOL TARTRATE 50 MG PO TABS: 50.0000 mg | ORAL_TABLET | Freq: Two times a day (BID) | ORAL | 1 refills | Status: DC

## 2021-12-19 MED ORDER — ALENDRONATE SODIUM 70 MG PO TABS: 70.0000 mg | ORAL_TABLET | ORAL | 3 refills | Status: AC

## 2021-12-19 MED ORDER — AMLODIPINE BESYLATE 5 MG PO TABS: 5.0000 mg | ORAL_TABLET | Freq: Every day | ORAL | 1 refills | Status: DC

## 2021-12-19 NOTE — Patient Outreach (Signed)
[facility]N Post- Acute Care Coordinator follow up.   Verified in [facility] [name] transitioned from [facility] to home on 12/18/21.  Telephone call made to [name] (daughter/DPR) [phone] to discuss [facility] care coordination/care management follow up. Patient identifiers confirmed. Early Chars reports [name] has been home for a day. "Not sure what the needs are right now." Endorses PCP appointment is today with Dr. Larose Kells. Will have [facility].   Will email [facility] Management contact information and brochure.   No identifiable [facility] Care Management follow up needs at this time.    [name], MSN, RN,BSN [place] Acute Care Coordinator [phone] [facility]) [phone]  (Toll free office)

## 2021-12-19 NOTE — Progress Notes (Unsigned)
Subjective:    Patient ID: [name], female    DOB: Nov 01, 1929, 88 y.o.   MRN: [id number]  DOS:  12/19/2021 Type of visit - description: Hospital follow-up  Was discharged from the hospital 11/28/2021. Admitted from independent living facility, discharged to SNF. Here with her daughter  Patient was admitted after a fall. - Had a R wrist fracture. Was recommended conservative treatment. -Right supraorbital ecchymosis - Shoulder abrasion - Chin laceration - CT head, facial and cervical no acute.   Medical problems were actually stable.    Review of Systems After the hospital admission, went to [facility], subsequently discharged back to her independent living facility at [facility].  No further falls. Appetite is somewhat decreased Aches and pains are managed with Tylenol and both the patient and the daughter report no major pain. Nashla is somewhat concerned about ecchymosis at the right leg. She also has some pain at the right lateral chest.  No nausea or vomiting BMs normal.      Past Medical History:  Diagnosis Date   CAD (coronary artery disease)    mild nonobstructive disease on cath in 2003   Cancer [facility])    Cardiomyopathy    Probable Takotsubo, severe CP w/ normal cath in 1994. Severe CP in 2003 w/ widespread T wave inversions on ECG. Cath w/ minimal coronary disease but LV-gram showed periapical severe hypokinesis and basilar hyperkinesia (EF 40%). Last echo in 4/09 confirmed full LV functional recovery with EF 60%, no regional wall motion abnormalities, mild to moderate LVH.   Chronic diastolic CHF (congestive heart failure) ([place]) 09/13/2019   CVA (cerebral infarction)    Depression with anxiety 03/29/2011   lost husband 3'09   DVT (deep venous thrombosis) ([place])    after venous ablation, R leg   Fracture 09/10/2007   L2, status post vertebroplasty of L2 performed by IR   GERD (gastroesophageal reflux disease)    Hemorrhoids    Hiatal hernia  03/29/2011   no nerve problems   Hyperlipidemia    Hyperplastic colon polyp 06/2001   Hypertension    Iron deficiency anemia due to chronic blood loss 12/30/2017   OA (osteoarthritis) of knee 03/29/2011   w/ bilateral knee pain-not a problem now   OSA (obstructive sleep apnea) 03/29/2011   no cpap used, not a problem now.   Osteoporosis    Otalgia of both ears    Dr. Simeon Craft   Polycythemia    Stroke [facility]) 03/29/2011   CVA x2 -last 10'12-?TIA(visual problems)   Varicose vein     Past Surgical History:  Procedure Laterality Date   APPENDECTOMY     BACK SURGERY     BREAST BIOPSY     BREAST CYST EXCISION     BREAST LUMPECTOMY Left 2013   left stage I left breast cancer   CHOLECYSTECTOMY  04/09/2011   Procedure: LAPAROSCOPIC CHOLECYSTECTOMY WITH INTRAOPERATIVE CHOLANGIOGRAM;  Surgeon: Odis Hollingshead, MD;  Location: [facility];  Service: General;  Laterality: N/A;   Dental Extraction     L maxillary molar   INTRAMEDULLARY (IM) NAIL INTERTROCHANTERIC Left 12/25/2019   Procedure: INTRAMEDULLARY (IM) NAIL INTERTROCHANTRIC;  Surgeon: Paralee Cancel, MD;  [facility];  Service: Orthopedics;  Laterality: Left;   IR KYPHO THORACIC WITH BONE BIOPSY  09/15/2019   IR KYPHO THORACIC WITH BONE BIOPSY  07/13/2020       IR KYPHO THORACIC WITH BONE BIOPSY  07/13/2020   IR VERTEBROPLASTY CERV/THOR BX INC UNI/BIL INC/INJECT/IMAGING  11/10/2019   KYPHOPLASTY N/A 11/16/2020   Procedure: T11,  L1,KYPHOPLASTY;  Surgeon: Melina Schools, MD;  Location: [place];  Service: Orthopedics;  Laterality: N/A;   KYPHOSIS SURGERY  08/2007   cement used   OVARIAN CYST SURGERY     left   RADIOLOGY WITH ANESTHESIA N/A 11/10/2019   Procedure: VERTEBROPLASTY;  Surgeon: Luanne Bras, MD;  Location: [place];  Service: Radiology;  Laterality: N/A;   TONSILLECTOMY     TOTAL KNEE ARTHROPLASTY  06/2010   left   TUBAL LIGATION      Current Outpatient Medications  Medication Instructions   acetaminophen (TYLENOL) 325 MG  tablet Take 2 tablets (650 mg total) by mouth every 8 (eight) hours for 5 days, THEN 2 tablets (650 mg total) every 6 (six) hours as needed.   alendronate (FOSAMAX) 70 MG tablet TAKE 1 TABLET(70 MG) BY MOUTH EVERY 7 DAYS WITH A FULL GLASS OF WATER AND ON AN EMPTY STOMACH   amLODipine (NORVASC) 5 mg, Oral, Daily   aspirin EC 81 mg, Oral, Daily, Swallow whole.   furosemide (LASIX) 20 mg, Oral, Every other day   LORazepam (ATIVAN) 0.5 mg, Oral, 2 times daily PRN   metoprolol tartrate (LOPRESSOR) 50 MG tablet TAKE 1 TABLET(50 MG) BY MOUTH TWICE DAILY   nitroGLYCERIN (NITROSTAT) 0.4 mg, Sublingual, Every 5 min x3 PRN   omeprazole (PRILOSEC) 20 mg, Oral, Daily   valACYclovir (VALTREX) 500 MG tablet TAKE 2 TABLETS BY MOUTH AT ONSET OF RASH AND 2 TABLETS 12 HOURS LATER AS NEEDED       Objective:   Physical Exam BP 116/62   Pulse 75   Temp 97.7 F (36.5 C) (Oral)   Resp 18   Ht '5\' 6"'$  (1.676 m)   Wt 113 lb 8 oz (51.5 kg)   SpO2 93%   BMI 18.32 kg/m  General:   Well developed, elderly lady, looks tired but nontoxic HEENT:  Normocephalic . Face symmetric, ecchymoses noted on the right side but otherwise she is able to move her eyes without problems. Lungs:  CTA B Normal respiratory effort, no intercostal retractions, no accessory muscle use. Chest wall: No TTP at the right side Heart: RRR,  no murmur.  Lower extremities: Nontender ecchymosis noted at the inner aspect of the R leg, I was able to assess the area up to the knee. Skin: Not pale. Not jaundice Neurologic:  alert & oriented X3.  Speech normal, gait not tested.   Psych--  Cognition and judgment appear intact.  Cooperative with normal attention span and concentration.  Behavior appropriate. No anxious or depressed appearing.      Assessment   ASSESSMENT HTN Dyslipidemia: Intolerant to most medicines including Zetia, Pravachol, Niaspan Depression, anxiety , insomnia - on ativan bid (03-2015 citalopram cause drowsiness,  04-2015 prozac:diarrhea) Hem-Onc: POLYCYTEMIA: sees hem-onc H/o breast ca MSK: ---DJD  ---Osteoporosis:  T score -2.8 (02-2013): declined treatment 06-2017 ---L2 fractures, s/p vertebral plasty 2009, s/p vertebral plasty T 8 07/2020 Migraine headaches - Tylenol #3 (rarely) GI: GERD- intolerant to prevacid, visual disturbances HH, diverticulitis, Colon polyps h/o "functional " LLQ abd pain CV: ---Cardiomyopathy Probable Takotsubo cardiomyopathy: CP w/ normal cath in 1994.   Severe CP  2003 with widespread T wave inversions on ECG.  Cath with minimal coronary disease but LV-gram showed periapical severe hypokinesis and basilar hyperkinesia (EF 40%) >>> ECHO 4/09 confirmed full LV functional recovery with EF 60%, mild LVH. 2012: Nl EF per echo, (-) Lexiscan 05/21/18 ECHO Nl/stable ---Stroke /  TIA 2012 ---ICB seizure 10/2019 was rx keppra, self d/c 05/2019 Sleep apnea 2012, , not on CPAP Hematology: ---H/o DVT, right leg after venous ablation --- Pulmonary emboli  dx 12-25-20 Raynaud phenomena, DX 05/2018   PLAN Fall, subsequent encounter: Was admitted to the hospital after a fall, see HPI for summary of events.  Had a right wrist fracture, saw Ortho, has a cast in place. She also has some pain at the right side of the chest, upon palpation there is no tender points. Current pain management is basically Tylenol. Will check a CMP and CBC as requested by the hospital team. She is currently at independent living facility in [place], I ask if she is ready to go to a SNF and the patient and the daughter plan to see how she is doing in the next few weeks and then decide. Physical Therapy?  Alvis Lemmings is following her up according to the patient's daughter. HTN: Controlled, refill amlodipine, Lasix, metoprolol. Osteoporosis: Refill Fosamax. RTC 3 months.   Extensive chart review today, extensive discussion about staying at the independent living facility versus SNF.

## 2021-12-19 NOTE — Patient Instructions (Addendum)
  GO TO THE LAB : Get the blood work     [place], [place] back for   a checkup in 3 months    Marchia Bond, MD. Schedule an appointment as soon as possible for a visit in 1 week(s).   Specialty: Orthopedic Surgery Contact information: [address] [place] [address] [phone]

## 2021-12-20 LAB — COMPREHENSIVE METABOLIC PANEL
ALT: 11 U/L (ref 0–35)
AST: 20 U/L (ref 0–37)
Albumin: 4.1 g/dL (ref 3.5–5.2)
Alkaline Phosphatase: 114 U/L (ref 39–117)
BUN: 10 mg/dL (ref 6–23)
CO2: 34 mEq/L — ABNORMAL HIGH (ref 19–32)
Calcium: 9.4 mg/dL (ref 8.4–10.5)
Chloride: 97 mEq/L (ref 96–112)
Creatinine, Ser: 0.87 mg/dL (ref 0.40–1.20)
GFR: 58.67 mL/min — ABNORMAL LOW (ref >60.00)
Glucose, Bld: 90 mg/dL (ref 70–99)
Potassium: 4.7 mEq/L (ref 3.5–5.1)
Sodium: 137 mEq/L (ref 135–145)
Total Bilirubin: 1 mg/dL (ref 0.2–1.2)
Total Protein: 6.7 g/dL (ref 6.0–8.3)

## 2021-12-20 LAB — CBC WITH DIFFERENTIAL/PLATELET
Basophils Absolute: 0 10*3/uL (ref 0.0–0.1)
Basophils Relative: 0.4 % (ref 0.0–3.0)
Eosinophils Absolute: 0.1 10*3/uL (ref 0.0–0.7)
Eosinophils Relative: 1.9 % (ref 0.0–5.0)
HCT: 44.4 % (ref 36.0–46.0)
Hemoglobin: 14.7 g/dL (ref 12.0–15.0)
Lymphocytes Relative: 7.9 % — ABNORMAL LOW (ref 12.0–46.0)
Lymphs Abs: 0.6 10*3/uL — ABNORMAL LOW (ref 0.7–4.0)
MCHC: 33.1 g/dL (ref 30.0–36.0)
MCV: 93.6 fl (ref 78.0–100.0)
Monocytes Absolute: 0.6 10*3/uL (ref 0.1–1.0)
Monocytes Relative: 8.8 % (ref 3.0–12.0)
Neutro Abs: 5.7 10*3/uL (ref 1.4–7.7)
Neutrophils Relative %: 81 % — ABNORMAL HIGH (ref 43.0–77.0)
Platelets: 473 10*3/uL — ABNORMAL HIGH (ref 150.0–400.0)
RBC: 4.74 Mil/uL (ref 3.87–5.11)
RDW: 15.5 % (ref 11.5–15.5)
WBC: 7 10*3/uL (ref 4.0–10.5)

## 2021-12-20 NOTE — Assessment & Plan Note (Addendum)
Fall, subsequent encounter: Was admitted to the hospital after a fall, see HPI for summary of events.  Had a right wrist fracture, saw Ortho, has a cast in place. She also has some pain at the right side of the chest, upon palpation there is no tender points. Current pain management is basically Tylenol. Will check a CMP and CBC as requested by the hospital team. She is currently at independent living facility in [place], I ask if she is ready to go to a SNF and the patient and the daughter plan to see how she is doing in the next few weeks and then decide. Physical Therapy?  Alvis Lemmings is following her up according to the patient's daughter. HTN: Controlled, refill amlodipine, Lasix, metoprolol. Osteoporosis: Refill Fosamax. RTC 3 months.

## 2021-12-22 LAB — DRUG TOX MONITOR 1 W/CONF, ORAL FLD
Alprazolam: NEGATIVE ng/mL (ref <0.50)
Amphetamines: NEGATIVE ng/mL (ref <10)
Barbiturates: NEGATIVE ng/mL (ref <10)
Benzodiazepines: POSITIVE ng/mL — AB (ref <0.50)
Buprenorphine: NEGATIVE ng/mL (ref <0.10)
Chlordiazepoxide: NEGATIVE ng/mL (ref <0.50)
Clonazepam: NEGATIVE ng/mL (ref <0.50)
Cocaine: NEGATIVE ng/mL (ref <5.0)
Codeine: NEGATIVE ng/mL (ref <2.5)
Diazepam: NEGATIVE ng/mL (ref <0.50)
Dihydrocodeine: NEGATIVE ng/mL (ref <2.5)
Fentanyl: NEGATIVE ng/mL (ref <0.10)
Flunitrazepam: NEGATIVE ng/mL (ref <0.50)
Flurazepam: NEGATIVE ng/mL (ref <0.50)
Heroin Metabolite: NEGATIVE ng/mL (ref <1.0)
Hydrocodone: 3.6 ng/mL — ABNORMAL HIGH (ref <2.5)
Hydromorphone: NEGATIVE ng/mL (ref <2.5)
Lorazepam: 0.89 ng/mL — ABNORMAL HIGH (ref <0.50)
MARIJUANA: NEGATIVE ng/mL (ref <2.5)
MDMA: NEGATIVE ng/mL (ref <10)
Meprobamate: NEGATIVE ng/mL (ref <2.5)
Methadone: NEGATIVE ng/mL (ref <5.0)
Midazolam: NEGATIVE ng/mL (ref <0.50)
Morphine: NEGATIVE ng/mL (ref <2.5)
Nicotine Metabolite: NEGATIVE ng/mL (ref <5.0)
Nordiazepam: NEGATIVE ng/mL (ref <0.50)
Norhydrocodone: NEGATIVE ng/mL (ref <2.5)
Noroxycodone: NEGATIVE ng/mL (ref <2.5)
Opiates: POSITIVE ng/mL — AB (ref <2.5)
Oxazepam: NEGATIVE ng/mL (ref <0.50)
Oxycodone: NEGATIVE ng/mL (ref <2.5)
Oxymorphone: NEGATIVE ng/mL (ref <2.5)
Phencyclidine: NEGATIVE ng/mL (ref <10)
Tapentadol: NEGATIVE ng/mL (ref <5.0)
Temazepam: NEGATIVE ng/mL (ref <0.50)
Tramadol: NEGATIVE ng/mL (ref <5.0)
Triazolam: NEGATIVE ng/mL (ref <0.50)
Zolpidem: NEGATIVE ng/mL (ref <5.0)

## 2021-12-26 ENCOUNTER — Ambulatory Visit: Payer: Medicare Other

## 2021-12-26 NOTE — Progress Notes (Deleted)
Subjective:   [name] is a 88 y.o. female who presents for Medicare Annual (Subsequent) preventive examination.  Review of Systems           Objective:    There were no vitals filed for this visit. There is no height or weight on file to calculate BMI.     11/24/2021    7:58 PM 10/07/2021   10:37 PM 10/07/2021   10:00 PM 10/05/2021   12:20 AM 09/30/2021   11:41 PM 09/05/2021   12:17 PM 05/29/2021    1:19 PM  Advanced Directives  Does Patient Have a Medical Advance Directive? No  Yes Yes Yes Yes Yes  Type of Armed forces logistics/support/administrative officer Power of [place] will   Does patient want to make changes to medical advance directive?  No - Patient declined       Copy of [place] in Chart?      No - copy requested   Would patient like information on creating a medical advance directive?      No - Patient declined     Current Medications (verified) Outpatient Encounter Medications as of 12/26/2021  Medication Sig   acetaminophen (TYLENOL) 325 MG tablet Take 2 tablets (650 mg total) by mouth every 8 (eight) hours for 5 days, THEN 2 tablets (650 mg total) every 6 (six) hours as needed.   alendronate (FOSAMAX) 70 MG tablet Take 1 tablet (70 mg total) by mouth once a week. Take with a full glass of water on an empty stomach.   amLODipine (NORVASC) 5 MG tablet Take 1 tablet (5 mg total) by mouth daily.   aspirin EC 81 MG tablet Take 81 mg by mouth daily. Swallow whole.   furosemide (LASIX) 20 MG tablet Take 1 tablet (20 mg total) by mouth every other day.   LORazepam (ATIVAN) 0.5 MG tablet Take 1 tablet (0.5 mg total) by mouth 2 (two) times daily as needed for anxiety.   metoprolol tartrate (LOPRESSOR) 50 MG tablet Take 1 tablet (50 mg total) by mouth 2 (two) times daily.   nitroGLYCERIN (NITROSTAT) 0.4 MG SL tablet Place 1 tablet (0.4 mg total) under the tongue every 5  (five) minutes x 3 doses as needed for chest pain. (Patient not taking: Reported on 12/19/2021)   omeprazole (PRILOSEC) 20 MG capsule Take 1 capsule (20 mg total) by mouth daily.   valACYclovir (VALTREX) 500 MG tablet TAKE 2 TABLETS BY MOUTH AT ONSET OF RASH AND 2 TABLETS 12 HOURS LATER AS NEEDED (Patient not taking: Reported on 11/24/2021)   No facility-administered encounter medications on file as of 12/26/2021.    Allergies (verified) Penicillins, Valsartan, Atorvastatin, Carvedilol, Ezetimibe, Fluvastatin sodium, Magnesium hydroxide, Meloxicam, Pneumovax [pneumococcal polysaccharide vaccine], Quinapril hcl, Simvastatin, Topamax [topiramate], and Vit d-vit e-safflower oil   History: Past Medical History:  Diagnosis Date   CAD (coronary artery disease)    mild nonobstructive disease on cath in 2003   Cancer [facility])    Cardiomyopathy    Probable Takotsubo, severe CP w/ normal cath in 1994. Severe CP in 2003 w/ widespread T wave inversions on ECG. Cath w/ minimal coronary disease but LV-gram showed periapical severe hypokinesis and basilar hyperkinesia (EF 40%). Last echo in 4/09 confirmed full LV functional recovery with EF 60%, no regional wall motion abnormalities, mild to moderate LVH.   Chronic diastolic CHF (congestive heart failure) ([place]) 09/13/2019   CVA (  cerebral infarction)    Depression with anxiety 03/29/2011   lost husband 3'09   DVT (deep venous thrombosis) ([place])    after venous ablation, R leg   Fracture 09/10/2007   L2, status post vertebroplasty of L2 performed by IR   GERD (gastroesophageal reflux disease)    Hemorrhoids    Hiatal hernia 03/29/2011   no nerve problems   Hyperlipidemia    Hyperplastic colon polyp 06/2001   Hypertension    Iron deficiency anemia due to chronic blood loss 12/30/2017   OA (osteoarthritis) of knee 03/29/2011   w/ bilateral knee pain-not a problem now   OSA (obstructive sleep apnea) 03/29/2011   no cpap used, not a problem now.    Osteoporosis    Otalgia of both ears    Dr. Simeon Craft   Polycythemia    Stroke [facility]) 03/29/2011   CVA x2 -last 10'12-?TIA(visual problems)   Varicose vein    Past Surgical History:  Procedure Laterality Date   APPENDECTOMY     BACK SURGERY     BREAST BIOPSY     BREAST CYST EXCISION     BREAST LUMPECTOMY Left 2013   left stage I left breast cancer   CHOLECYSTECTOMY  04/09/2011   Procedure: LAPAROSCOPIC CHOLECYSTECTOMY WITH INTRAOPERATIVE CHOLANGIOGRAM;  Surgeon: Odis Hollingshead, MD;  [facility];  Service: General;  Laterality: N/A;   Dental Extraction     L maxillary molar   INTRAMEDULLARY (IM) NAIL INTERTROCHANTERIC Left 12/25/2019   Procedure: INTRAMEDULLARY (IM) NAIL INTERTROCHANTRIC;  Surgeon: Paralee Cancel, MD;  [facility];  Service: Orthopedics;  Laterality: Left;   IR KYPHO THORACIC WITH BONE BIOPSY  09/15/2019   IR KYPHO THORACIC WITH BONE BIOPSY  07/13/2020       IR KYPHO THORACIC WITH BONE BIOPSY  07/13/2020   IR VERTEBROPLASTY CERV/THOR BX INC UNI/BIL INC/INJECT/IMAGING  11/10/2019   KYPHOPLASTY N/A 11/16/2020   Procedure: T11,  L1,KYPHOPLASTY;  Surgeon: Melina Schools, MD;  Location: [place];  Service: Orthopedics;  Laterality: N/A;   KYPHOSIS SURGERY  08/2007   cement used   OVARIAN CYST SURGERY     left   RADIOLOGY WITH ANESTHESIA N/A 11/10/2019   Procedure: VERTEBROPLASTY;  Surgeon: Luanne Bras, MD;  Location: [place];  Service: Radiology;  Laterality: N/A;   TONSILLECTOMY     TOTAL KNEE ARTHROPLASTY  06/2010   left   TUBAL LIGATION     Family History  Problem Relation Age of Onset   Breast cancer Mother    Heart disease Father        MIs   Colon cancer Other        cousin   Breast cancer Sister    Leukemia Brother        GM   Social History   Socioeconomic History   Marital status: Widowed    Spouse name: Not on file   Number of children: 3   Years of education: Not on file   Highest education level: Not on file  Occupational History    Occupation: n/a  Tobacco Use   Smoking status: Some Days    Packs/day: 1.00    Years: 37.00    Total pack years: 37.00    Types: Cigarettes    Start date: 06/26/1949   Smokeless tobacco: Never   Tobacco comments:    quit 25 years ago  Vaping Use   Vaping Use: Never used  Substance and Sexual Activity   Alcohol use: Not Currently  Alcohol/week: 1.0 standard drink of alcohol    Types: 1 Glasses of wine per week   Drug use: No   Sexual activity: Not Currently  Other Topics Concern   Not on file  Social History Narrative   Lost husband 07/29/07-    Lives in [place]   has 3 daughters, lost Mickel Baas [phone])   Moved to ALF 04-2021   Early Chars lives in [place], Margaretha [place] ( [place])     Not driving as of [phone]            Social Determinants of Health   Financial Resource Strain: Low Risk  (08/10/2021)   Overall Financial Resource Strain (CARDIA)    Difficulty of Paying Living Expenses: Not hard at all  Food Insecurity: Not on file  Transportation Needs: No Transportation Needs (08/10/2021)   PRAPARE - Hydrologist (Medical): No    Lack of Transportation (Non-Medical): No  Physical Activity: Not on file  Stress: Not on file  Social Connections: Not on file    Tobacco Counseling Ready to quit: Not Answered Counseling given: Not Answered Tobacco comments: quit 25 years ago   Clinical Intake:                 Diabetic?no         Activities of Daily Living    11/25/2021    2:00 AM 10/07/2021   10:00 PM  In your present state of health, do you have any difficulty performing the following activities:  Hearing? 1 1  Vision? 1 0  Difficulty concentrating or making decisions? 0 1  Walking or climbing stairs? 1 1  Dressing or bathing? 0 1  Doing errands, shopping? 1 1    Patient Care Team: Colon Branch, MD as PCP - General Bettina Gavia, Hilton Cork, MD as PCP - Cardiology (Cardiology) Celso Amy, NP as Nurse Practitioner (Nurse  Practitioner) Volanda Napoleon, MD as Consulting Physician (Oncology) Melrose Nakayama, MD as Consulting Physician (Orthopedic Surgery) Melina Schools, MD as Consulting Physician (Orthopedic Surgery)  Indicate any recent Medical Services you may have received from other than [facility] providers in the past year (date may be approximate).     Assessment:   This is a routine wellness examination for [name].  Hearing/Vision screen No results found.  Dietary issues and exercise activities discussed:     Goals Addressed   None    Depression Screen    12/19/2021    3:34 PM 08/29/2021    3:44 PM 05/22/2021    2:39 PM 11/21/2020    2:04 PM 10/26/2020    1:05 PM 08/22/2020    2:40 PM 04/22/2020    2:46 PM  PHQ 2/9 Scores  PHQ - 2 Score 0 0 0 0  3 2  PHQ- 9 Score 0 0 0 0  11 9  Exception Documentation     Medical reason      Fall Risk    02/13/2021    1:52 PM 11/21/2020    2:04 PM 07/25/2020    2:03 PM 10/23/2019    9:04 AM 07/15/2018    1:44 PM  Fall Risk   Falls in the past year? '1 1 1 '$ 0 0  Number falls in past yr: '1 1 1 '$ 0   Injury with Fall? '1 1 1    '$ Follow up Falls evaluation completed Falls evaluation completed       [place]:  Any stairs in or  around the home? {YES/NO:21197} If so, are there any without handrails? {YES/NO:21197} Home free of loose throw rugs in walkways, pet beds, electrical cords, etc? {YES/NO:21197} Adequate lighting in your home to reduce risk of falls? {YES/NO:21197}  ASSISTIVE DEVICES UTILIZED TO PREVENT FALLS:  Life alert? {YES/NO:21197} Use of a cane, walker or w/c? {YES/NO:21197} Grab bars in the bathroom? {YES/NO:21197} Shower chair or bench in shower? {YES/NO:21197} Elevated toilet seat or a handicapped toilet? {YES/NO:21197}  TIMED UP AND GO:  Was the test performed? {YES/NO:21197}.  Length of time to ambulate 10 feet: *** sec.   {Appearance of GHWE:9937169}  Cognitive Function:    05/10/2016    1:36 PM  05/04/2015    1:48 PM  MMSE - Mini Mental State Exam  Orientation to time 5 5  Orientation to Place 5 5  Registration 3 3  Attention/ Calculation 5 5  Recall 3 3  Language- name 2 objects 2 2  Language- repeat 1 1  Language- follow 3 step command 3 3  Language- read & follow direction 1 1  Write a sentence 1 1  Copy design 1 1  Total score 30 30        Immunizations Immunization History  Administered Date(s) Administered   Fluad Quad(high Dose 65+) 01/26/2019, 02/13/2021   Influenza Split 03/21/2011   Influenza, High Dose Seasonal PF 02/15/2015, 05/24/2017, 02/14/2018   Influenza,inj,Quad PF,6+ Mos 03/19/2014, 02/10/2016   Influenza-Unspecified 02/20/2018, 02/26/2020   Moderna Sars-Covid-2 Vaccination 04/28/2020   PFIZER(Purple Top)SARS-COV-2 Vaccination 07/28/2019, 08/18/2019   Pneumococcal Conjugate-13 03/19/2014   Pneumococcal Polysaccharide-23 08/18/2012   Td 10/12/2005, 12/18/2016    TDAP status: Up to date  Flu Vaccine status: Up to date  Pneumococcal vaccine status: Up to date  {Covid-19 vaccine status:2101808}  Qualifies for Shingles Vaccine? {YES/NO:21197}  Zostavax completed {YES/NO:21197}  {Shingrix Completed?:2101804}  Screening Tests Health Maintenance  Topic Date Due   Zoster Vaccines- Shingrix (1 of 2) Never done   COVID-19 Vaccine (4 - Mixed Product risk series) 06/23/2020   INFLUENZA VACCINE  02/17/2022 (Originally 12/12/2021)   TETANUS/TDAP  12/19/2026   Pneumonia Vaccine 11+ Years old  Completed   DEXA SCAN  Completed   HPV VACCINES  Aged Out    Health Maintenance  Health Maintenance Due  Topic Date Due   Zoster Vaccines- Shingrix (1 of 2) Never done   COVID-19 Vaccine (4 - Mixed Product risk series) 06/23/2020    Colorectal cancer screening: No longer required.   {Mammogram status:21018020}  {Bone Density status:21018021}  Lung Cancer Screening: (Low Dose CT Chest recommended if Age 31-80 years, 30 pack-year currently smoking  OR have quit w/in 15years.) does not qualify.   Lung Cancer Screening Referral: n/a  Additional Screening:  Hepatitis C Screening: does not qualify; Completed aged out  Vision Screening: Recommended annual ophthalmology exams for early detection of glaucoma and other disorders of the eye. Is the patient up to date with their annual eye exam?  {YES/NO:21197} Who is the provider or what is the name of the office in which the patient attends annual eye exams? *** If pt is not established with a provider, would they like to be referred to a provider to establish care? {YES/NO:21197}.   Dental Screening: Recommended annual dental exams for proper oral hygiene  Community Resource Referral / Chronic Care Management: CRR required this visit?  {YES/NO:21197}  CCM required this visit?  {YES/NO:21197}     Plan:     I have personally reviewed and noted the following in  the patient's chart:   Medical and social history Use of alcohol, tobacco or illicit drugs  Current medications and supplements including opioid prescriptions.  Functional ability and status Nutritional status Physical activity Advanced directives List of other physicians Hospitalizations, surgeries, and ER visits in previous 12 months Vitals Screenings to include cognitive, depression, and falls Referrals and appointments  In addition, I have reviewed and discussed with patient certain preventive protocols, quality metrics, and best practice recommendations. A written personalized care plan for preventive services as well as general preventive health recommendations were provided to patient.     Duard Brady Nicola Quesnell, [place]   12/26/2021   Nurse Notes: ***

## 2022-01-12 DIAGNOSIS — S52501D Unspecified fracture of the lower end of right radius, subsequent encounter for closed fracture with routine healing: Secondary | ICD-10-CM | POA: Diagnosis not present

## 2022-01-22 ENCOUNTER — Ambulatory Visit: Payer: Medicare Other | Admitting: Internal Medicine

## 2022-02-01 ENCOUNTER — Telehealth: Payer: Self-pay

## 2022-02-01 MED ORDER — LORAZEPAM 0.5 MG PO TABS: 0.5000 mg | ORAL_TABLET | Freq: Two times a day (BID) | ORAL | 2 refills | Status: DC | PRN

## 2022-02-01 NOTE — Telephone Encounter (Signed)
PDMP okay, Rx sent 

## 2022-02-01 NOTE — Telephone Encounter (Signed)
Requesting: lorazepam 0.'5mg'$   Contract:09/19/21 UDS:12/19/21 Last Visit: 12/19/21 Next Visit: 03/21/22 Last Refill: 12/19/21 #60 and 0RF  Please Advise

## 2022-02-02 ENCOUNTER — Telehealth: Payer: Self-pay

## 2022-02-02 NOTE — Telephone Encounter (Signed)
PT/OT orders signed and faxed back to [facility] at [phone]. Form sent for scanning.

## 2022-02-06 ENCOUNTER — Encounter: Payer: Self-pay | Admitting: Family Medicine

## 2022-02-06 ENCOUNTER — Ambulatory Visit (HOSPITAL_BASED_OUTPATIENT_CLINIC_OR_DEPARTMENT_OTHER)
Admission: RE | Admit: 2022-02-06 | Discharge: 2022-02-06 | Disposition: A | Discharge status: Home / Self Care | Payer: Medicare Other | Source: Ambulatory Visit | Attending: Family Medicine | Admitting: Family Medicine

## 2022-02-06 ENCOUNTER — Ambulatory Visit (INDEPENDENT_AMBULATORY_CARE_PROVIDER_SITE_OTHER): Payer: Medicare Other | Admitting: Family Medicine

## 2022-02-06 VITALS — BP 118/80 | HR 71 | Temp 97.8°F | Resp 16 | Ht 66.0 in

## 2022-02-06 DIAGNOSIS — Z23 Encounter for immunization: Secondary | ICD-10-CM

## 2022-02-06 DIAGNOSIS — J90 Pleural effusion, not elsewhere classified: Secondary | ICD-10-CM | POA: Diagnosis not present

## 2022-02-06 DIAGNOSIS — S2241XA Multiple fractures of ribs, right side, initial encounter for closed fracture: Secondary | ICD-10-CM | POA: Diagnosis not present

## 2022-02-06 DIAGNOSIS — M25521 Pain in right elbow: Secondary | ICD-10-CM | POA: Insufficient documentation

## 2022-02-06 DIAGNOSIS — S41111A Laceration without foreign body of right upper arm, initial encounter: Secondary | ICD-10-CM | POA: Diagnosis not present

## 2022-02-06 DIAGNOSIS — W19XXXA Unspecified fall, initial encounter: Secondary | ICD-10-CM | POA: Insufficient documentation

## 2022-02-06 DIAGNOSIS — R918 Other nonspecific abnormal finding of lung field: Secondary | ICD-10-CM | POA: Diagnosis not present

## 2022-02-06 MED ORDER — DOXYCYCLINE HYCLATE 100 MG PO TABS: 100.0000 mg | ORAL_TABLET | Freq: Two times a day (BID) | ORAL | 0 refills | Status: DC

## 2022-02-06 NOTE — Patient Instructions (Signed)
It is important to avoid accidents which may result in broken bones.  Here are a few ideas on how to make your home safer so you will be less likely to trip or fall.  Use nonskid mats or non slip strips in your shower or tub, on your bathroom floor and around sinks.  If you know that you have spilled water, wipe it up! In the bathroom, it is important to have properly installed grab bars on the walls or on the edge of the tub.  Towel racks are NOT strong enough for you to hold onto or to pull on for support. Stairs and hallways should have enough light.  Add lamps or night lights if you need ore light. It is good to have handrails on both sides of the stairs if possible.  Always fix broken handrails right away. It is important to see the edges of steps.  Paint the edges of outdoor steps white so you can see them better.  Put colored tape on the edge of inside steps. Throw-rugs are dangerous because they can slide.  Removing the rugs is the best idea, but if they must stay, add adhesive carpet tape to prevent slipping. Do not keep things on stairs or in the halls.  Remove small furniture that blocks the halls as it may cause you to trip.  Keep telephone and electrical cords out of the way where you walk. Always were sturdy, rubber-soled shoes for good support.  Never wear just socks, especially on the stairs.  Socks may cause you to slip or fall.  Do not wear full-length housecoats as you can easily trip on the bottom.  Place the things you use the most on the shelves that are the easiest to reach.  If you use a stepstool, make sure it is in good condition.  If you feel unsteady, DO NOT climb, ask for help. If a health professional advises you to use a cane or walker, do not be ashamed.  These items can keep you from falling and breaking your bones. 

## 2022-02-08 ENCOUNTER — Encounter: Payer: Self-pay | Admitting: Family Medicine

## 2022-02-08 DIAGNOSIS — S41111A Laceration without foreign body of right upper arm, initial encounter: Secondary | ICD-10-CM | POA: Insufficient documentation

## 2022-02-08 DIAGNOSIS — M25521 Pain in right elbow: Secondary | ICD-10-CM | POA: Insufficient documentation

## 2022-02-08 NOTE — Progress Notes (Signed)
Established Patient Office Visit  Subjective   Patient ID: [name], female    DOB: Jan 28, 1930  Age: 88 y.o. MRN: [id number]  Chief Complaint  Patient presents with   Fall    Pt had a fall this past Friday. Pt states the room started spinning.     HPI Pt states she fell last Friday--- she was at her mailbox and lost her balance and fell on her elbows and hit her head.  Her daughter is with her    this was the 5th time she fell.  She currently lives in indep living  Patient Active Problem List   Diagnosis Date Noted   Right elbow pain 02/08/2022   Laceration of right upper extremity 02/08/2022   Fall 11/25/2021   Closed right radial fracture 11/25/2021   Chin laceration 11/25/2021   Shoulder abrasion, right, initial encounter 11/25/2021   Supraorbital ecchymosis 11/25/2021   Protein-calorie malnutrition, severe 10/09/2021   Acute encephalopathy 10/07/2021   Compression fracture of T7 vertebra  10/07/2021   Rib pain on right side 01/24/2021   Skin rash 01/24/2021   Mastalgia 01/24/2021   PE (pulmonary thromboembolism) ([place]) 12/25/2020   Lumbar compression fracture ([place]) 11/16/2020   Generalized weakness 10/03/2020   GERD (gastroesophageal reflux disease)    Dyspnea    DVT (deep venous thrombosis) ([facility])    Diverticulitis    CAD (coronary artery disease)    Acute midline thoracic back pain 03/04/2020   Pressure injury of skin 12/27/2019   S/p left hip fracture    Comminuted fracture of hip, left, open type I or II, initial encounter ([place]) 12/24/2019   Closed left hip fracture ([place]) 12/24/2019   ICH (intracerebral hemorrhage) ([place]) 11/04/2019   Intractable back pain 09/13/2019   Compression fracture of T9 vertebra ([place]) 09/13/2019   Chronic diastolic CHF (congestive heart failure) ([place]) 09/13/2019   Overweight (BMI 25.0-29.9) 09/13/2019   Constipation 09/13/2019   Raynaud's phenomenon without gangrene 05/19/2018   Iron deficiency anemia due to chronic blood loss  12/30/2017   Insomnia 06/25/2017   Mild CAD 05/24/2017   PCP NOTES >>>>>>>>> 02/15/2015   Superficial thrombophlebitis 04/06/2014   Annual physical exam 08/18/2012   Breast cancer ([place]) 11/05/2011   Polycythemia 11/05/2011   Stroke ([place]) 03/29/2011   OSA (obstructive sleep apnea) 03/29/2011   OA (osteoarthritis) of knee 03/29/2011   Hiatal hernia 03/29/2011   E. coli gastroenteritis 03/29/2011   Depression with anxiety 03/29/2011   Chest pain 01/16/2011   TIA (transient ischemic attack) 01/16/2011   CARDIOMYOPATHY 08/03/2009   DJD -- pain mgmt 08/16/2008   ABDOMINAL PAIN, LEFT LOWER QUADRANT 07/05/2008   GAIT DISTURBANCE 10/21/2007   ANXIETY DEPRESSION 10/08/2007   Fracture 09/10/2007   Hyperlipidemia 06/25/2007   Takotsubo cardiomyopathy 06/25/2007   GERD without esophagitis 06/25/2007   Essential hypertension 09/30/2006   Senile osteoporosis 09/30/2006   Migraine with aura 09/30/2006   Hyperplastic colon polyp 06/2001   Past Medical History:  Diagnosis Date   CAD (coronary artery disease)    mild nonobstructive disease on cath in 2003   Cancer [facility])    Cardiomyopathy    Probable Takotsubo, severe CP w/ normal cath in 1994. Severe CP in 2003 w/ widespread T wave inversions on ECG. Cath w/ minimal coronary disease but LV-gram showed periapical severe hypokinesis and basilar hyperkinesia (EF 40%). Last echo in 4/09 confirmed full LV functional recovery with EF 60%, no regional wall motion abnormalities, mild to moderate LVH.   Chronic diastolic  CHF (congestive heart failure) ([place]) 09/13/2019   CVA (cerebral infarction)    Depression with anxiety 03/29/2011   lost husband 3'09   DVT (deep venous thrombosis) ([place])    after venous ablation, R leg   Fracture 09/10/2007   L2, status post vertebroplasty of L2 performed by IR   GERD (gastroesophageal reflux disease)    Hemorrhoids    Hiatal hernia 03/29/2011   no nerve problems   Hyperlipidemia    Hyperplastic colon polyp  06/2001   Hypertension    Iron deficiency anemia due to chronic blood loss 12/30/2017   OA (osteoarthritis) of knee 03/29/2011   w/ bilateral knee pain-not a problem now   OSA (obstructive sleep apnea) 03/29/2011   no cpap used, not a problem now.   Osteoporosis    Otalgia of both ears    Dr. Simeon Craft   Polycythemia    Stroke [facility]) 03/29/2011   CVA x2 -last 10'12-?TIA(visual problems)   Varicose vein    Past Surgical History:  Procedure Laterality Date   APPENDECTOMY     BACK SURGERY     BREAST BIOPSY     BREAST CYST EXCISION     BREAST LUMPECTOMY Left 2013   left stage I left breast cancer   CHOLECYSTECTOMY  04/09/2011   Procedure: LAPAROSCOPIC CHOLECYSTECTOMY WITH INTRAOPERATIVE CHOLANGIOGRAM;  Surgeon: Odis Hollingshead, MD;  Location: [facility];  Service: General;  Laterality: N/A;   Dental Extraction     L maxillary molar   INTRAMEDULLARY (IM) NAIL INTERTROCHANTERIC Left 12/25/2019   Procedure: INTRAMEDULLARY (IM) NAIL INTERTROCHANTRIC;  Surgeon: Paralee Cancel, MD;  Location: [facility];  Service: Orthopedics;  Laterality: Left;   IR KYPHO THORACIC WITH BONE BIOPSY  09/15/2019   IR KYPHO THORACIC WITH BONE BIOPSY  07/13/2020       IR KYPHO THORACIC WITH BONE BIOPSY  07/13/2020   IR VERTEBROPLASTY CERV/THOR BX INC UNI/BIL INC/INJECT/IMAGING  11/10/2019   KYPHOPLASTY N/A 11/16/2020   Procedure: T11,  L1,KYPHOPLASTY;  Surgeon: Melina Schools, MD;  Location: [facility];  Service: Orthopedics;  Laterality: N/A;   KYPHOSIS SURGERY  08/2007   cement used   OVARIAN CYST SURGERY     left   RADIOLOGY WITH ANESTHESIA N/A 11/10/2019   Procedure: VERTEBROPLASTY;  Surgeon: Luanne Bras, MD;  Location: [facility];  Service: Radiology;  Laterality: N/A;   TONSILLECTOMY     TOTAL KNEE ARTHROPLASTY  06/2010   left   TUBAL LIGATION     Social History   Tobacco Use   Smoking status: Some Days    Packs/day: 1.00    Years: 37.00    Total pack years: 37.00    Types: Cigarettes    Start date: 06/26/1951    Smokeless tobacco: Never   Tobacco comments:    quit 25 years ago  Vaping Use   Vaping Use: Never used  Substance Use Topics   Alcohol use: Not Currently    Alcohol/week: 1.0 standard drink of alcohol    Types: 1 Glasses of wine per week   Drug use: No   Social History   Socioeconomic History   Marital status: Widowed    Spouse name: Not on file   Number of children: 3   Years of education: Not on file   Highest education level: Not on file  Occupational History   Occupation: n/a  Tobacco Use   Smoking status: Some Days    Packs/day: 1.00    Years: 37.00    Total pack years:  37.00    Types: Cigarettes    Start date: 06/26/1951   Smokeless tobacco: Never   Tobacco comments:    quit 25 years ago  Vaping Use   Vaping Use: Never used  Substance and Sexual Activity   Alcohol use: Not Currently    Alcohol/week: 1.0 standard drink of alcohol    Types: 1 Glasses of wine per week   Drug use: No   Sexual activity: Not Currently  Other Topics Concern   Not on file  Social History Narrative   Lost husband 07/29/07-    Lives in [place]   has 3 daughters, lost Mickel Baas (03-2017)   Moved to ALF 04-2021   Early Chars lives in [place], [place] ( [place])     Not driving as of [id number]            Social Determinants of Health   Financial Resource Strain: Low Risk  (08/10/2021)   Overall Financial Resource Strain (CARDIA)    Difficulty of Paying Living Expenses: Not hard at all  Food Insecurity: Not on file  Transportation Needs: No Transportation Needs (08/10/2021)   PRAPARE - Hydrologist (Medical): No    Lack of Transportation (Non-Medical): No  Physical Activity: Not on file  Stress: Not on file  Social Connections: Not on file  Intimate Partner Violence: Not on file   Family Status  Relation Name Status   Mother  Deceased   Father  Deceased   Other  (Not Specified)   Sister  (Not Specified)   Brother  (Not Specified)   Family History  Problem  Relation Age of Onset   Breast cancer Mother    Heart disease Father        MIs   Colon cancer Other        cousin   Breast cancer Sister    Leukemia Brother        GM   Allergies  Allergen Reactions   Penicillins Hives, Rash and Other (See Comments)    Has patient had a PCN reaction causing immediate rash, facial/tongue/throat swelling, SOB or lightheadedness with hypotension: Yes Has patient had a PCN reaction causing severe rash involving mucus membranes or skin necrosis: yes Has patient had a PCN reaction that required hospitalization: No Has patient had a PCN reaction occurring within the last 10 years: no If all of the above answers are "NO", then may proceed with Cephalosporin use.  Other Reaction: OTHER REACTION   Valsartan Itching and Other (See Comments)   Atorvastatin Diarrhea   Carvedilol Other (See Comments)    "teeth hurt when I took it"    Ezetimibe Nausea Only   Fluvastatin Sodium Other (See Comments)    "Can't remember"   Magnesium Hydroxide Other (See Comments)    REACTION: triggers HAs   Meloxicam Other (See Comments)    Pt seeing auro's / spots.     Pneumovax [Pneumococcal Polysaccharide Vaccine] Other (See Comments)    Local reaction   Quinapril Hcl Other (See Comments)     "feelings of tiredness"   Simvastatin Diarrhea   Topamax [Topiramate] Itching   Vit D-Vit E-Safflower Oil Other (See Comments)    Headaches      ROS    Objective:     BP 118/80 (BP Location: Left Arm, Patient Position: Sitting, Cuff Size: Normal)   Pulse 71   Temp 97.8 F (36.6 C) (Oral)   Resp 16   Ht '5\' 6"'$  (1.676 m)  SpO2 91%   BMI 18.32 kg/m  BP Readings from Last 3 Encounters:  02/06/22 118/80  12/19/21 116/62  11/28/21 140/63   Wt Readings from Last 3 Encounters:  12/19/21 113 lb 8 oz (51.5 kg)  11/25/21 116 lb 6.5 oz (52.8 kg)  10/09/21 127 lb 13.9 oz (58 kg)   SpO2 Readings from Last 3 Encounters:  02/06/22 91%  12/19/21 93%  11/28/21 94%       Physical Exam   Results for orders placed or performed in visit on 02/06/22  Wound culture   Specimen: Wound  Result Value Ref Range   MICRO NUMBER: [id number]    SPECIMEN QUALITY: Adequate    SOURCE: WOUND (SITE NOT SPECIFIED)    STATUS: PRELIMINARY    GRAM STAIN:      No white blood cells seen No epithelial cells seen No organisms seen   RESULT: Culture in progress     Last CBC Lab Results  Component Value Date   WBC 7.0 12/19/2021   HGB 14.7 12/19/2021   HCT 44.4 12/19/2021   MCV 93.6 12/19/2021   MCH 30.8 11/26/2021   RDW 15.5 12/19/2021   PLT 473.0 (H) 26/71/2458   Last metabolic panel Lab Results  Component Value Date   GLUCOSE 90 12/19/2021   NA 137 12/19/2021   K 4.7 12/19/2021   CL 97 12/19/2021   CO2 34 (H) 12/19/2021   BUN 10 12/19/2021   CREATININE 0.87 12/19/2021   GFRNONAA >60 11/25/2021   CALCIUM 9.4 12/19/2021   PROT 6.7 12/19/2021   ALBUMIN 4.1 12/19/2021   LABGLOB 2.7 10/04/2016   AGRATIO 1.6 10/04/2016   BILITOT 1.0 12/19/2021   ALKPHOS 114 12/19/2021   AST 20 12/19/2021   ALT 11 12/19/2021   ANIONGAP 5 11/25/2021   Last lipids Lab Results  Component Value Date   CHOL 212 (H) 03/19/2014   HDL 47.20 03/19/2014   LDLCALC 143 (H) 03/19/2014   LDLDIRECT 142.3 06/25/2011   TRIG 110.0 03/19/2014   CHOLHDL 4 03/19/2014   Last hemoglobin A1c No results found for: "HGBA1C" Last thyroid functions Lab Results  Component Value Date   TSH 2.312 10/08/2021   Last vitamin D Lab Results  Component Value Date   VD25OH 30.35 10/06/2020   Last vitamin B12 and Folate Lab Results  Component Value Date   VITAMINB12 326 10/08/2021   FOLATE 10.1 07/25/2015      The ASCVD Risk score (Arnett DK, et al., 2019) failed to calculate for the following reasons:   The 2019 ASCVD risk score is only valid for ages 1 to 38   The patient has a prior MI or stroke diagnosis    Assessment & Plan:   Problem List Items Addressed This Visit        Unprioritized   Right elbow pain - Primary   Relevant Orders   DG Elbow Complete Right (Completed)   Laceration of right upper extremity    Wound cleaned and abx ointment and sterile dressing placed       Relevant Medications   doxycycline (VIBRA-TABS) 100 MG tablet   Other Relevant Orders   Td vaccine greater than or equal to 7yo preservative free IM (Completed)   Wound culture (Completed)   Fall    Mult falls--- she has had PT D/w pt and her daughter assisted living ----- they have discussed this before and the pt had refused She states she will think about it and f/u with pcp  Relevant Orders   DG Ribs Unilateral W/Chest Right (Completed)   Other Visit Diagnoses     Need for influenza vaccination       Relevant Orders   Flu Vaccine QUAD High Dose(Fluad) (Completed)       Return if symptoms worsen or fail to improve.    Ann Held, DO

## 2022-02-08 NOTE — Assessment & Plan Note (Signed)
Wound cleaned and abx ointment and sterile dressing placed

## 2022-02-08 NOTE — Assessment & Plan Note (Signed)
Mult falls--- she has had PT D/w pt and her daughter assisted living ----- they have discussed this before and the pt had refused She states she will think about it and f/u with pcp

## 2022-02-09 DIAGNOSIS — S52501D Unspecified fracture of the lower end of right radius, subsequent encounter for closed fracture with routine healing: Secondary | ICD-10-CM | POA: Diagnosis not present

## 2022-02-09 LAB — WOUND CULTURE
MICRO NUMBER:: 13969609
SPECIMEN QUALITY:: ADEQUATE

## 2022-02-14 ENCOUNTER — Telehealth: Payer: Self-pay | Admitting: Internal Medicine

## 2022-02-14 MED ORDER — ONDANSETRON HCL 4 MG PO TABS: 4.0000 mg | ORAL_TABLET | Freq: Three times a day (TID) | ORAL | 0 refills | Status: DC | PRN

## 2022-02-14 NOTE — Telephone Encounter (Signed)
Advise patient: She needs to be seen, please arrange a visit. If she has severe symptoms, stomach pain, constipation, change in the color of the stools, fever chills: Go to the ER. In the meantime I sent some nausea medicine but again needs to be seen.

## 2022-02-14 NOTE — Telephone Encounter (Signed)
Spoke w/ Pt- informed of recommendations. Pt verbalized understanding.  She will have to discuss w/ her daughter when she can bring her in.

## 2022-02-14 NOTE — Telephone Encounter (Signed)
Patient called to advise that she is feeling nauseous but not sure what it is. The feeling comes and goes. She said that she feels like she has to throw up but can't. Advised patient she may need an appt but she said she doesn't drive anymore so would have to find a ride if she has to come in. Please call to advise.

## 2022-02-19 ENCOUNTER — Emergency Department (HOSPITAL_COMMUNITY): Payer: Medicare Other

## 2022-02-19 ENCOUNTER — Emergency Department (HOSPITAL_COMMUNITY)
Admission: EM | Admit: 2022-02-19 | Discharge: 2022-02-19 | Disposition: A | Discharge status: Home / Self Care | Payer: Medicare Other | Attending: Emergency Medicine | Admitting: Emergency Medicine

## 2022-02-19 ENCOUNTER — Other Ambulatory Visit: Payer: Self-pay

## 2022-02-19 ENCOUNTER — Encounter (HOSPITAL_COMMUNITY): Payer: Self-pay | Admitting: *Deleted

## 2022-02-19 DIAGNOSIS — S22060A Wedge compression fracture of T7-T8 vertebra, initial encounter for closed fracture: Secondary | ICD-10-CM

## 2022-02-19 DIAGNOSIS — R6889 Other general symptoms and signs: Secondary | ICD-10-CM | POA: Diagnosis not present

## 2022-02-19 DIAGNOSIS — M40204 Unspecified kyphosis, thoracic region: Secondary | ICD-10-CM | POA: Diagnosis not present

## 2022-02-19 DIAGNOSIS — W19XXXA Unspecified fall, initial encounter: Secondary | ICD-10-CM | POA: Diagnosis not present

## 2022-02-19 DIAGNOSIS — S299XXA Unspecified injury of thorax, initial encounter: Secondary | ICD-10-CM | POA: Diagnosis present

## 2022-02-19 DIAGNOSIS — I110 Hypertensive heart disease with heart failure: Secondary | ICD-10-CM | POA: Diagnosis not present

## 2022-02-19 DIAGNOSIS — Z79899 Other long term (current) drug therapy: Secondary | ICD-10-CM | POA: Insufficient documentation

## 2022-02-19 DIAGNOSIS — S22068A Other fracture of T7-T8 thoracic vertebra, initial encounter for closed fracture: Secondary | ICD-10-CM | POA: Diagnosis not present

## 2022-02-19 DIAGNOSIS — Z853 Personal history of malignant neoplasm of breast: Secondary | ICD-10-CM | POA: Insufficient documentation

## 2022-02-19 DIAGNOSIS — Z743 Need for continuous supervision: Secondary | ICD-10-CM | POA: Diagnosis not present

## 2022-02-19 DIAGNOSIS — S0990XA Unspecified injury of head, initial encounter: Secondary | ICD-10-CM | POA: Diagnosis not present

## 2022-02-19 DIAGNOSIS — W1839XA Other fall on same level, initial encounter: Secondary | ICD-10-CM | POA: Diagnosis not present

## 2022-02-19 DIAGNOSIS — I5032 Chronic diastolic (congestive) heart failure: Secondary | ICD-10-CM | POA: Insufficient documentation

## 2022-02-19 DIAGNOSIS — R531 Weakness: Secondary | ICD-10-CM | POA: Diagnosis not present

## 2022-02-19 DIAGNOSIS — I2510 Atherosclerotic heart disease of native coronary artery without angina pectoris: Secondary | ICD-10-CM | POA: Insufficient documentation

## 2022-02-19 DIAGNOSIS — M546 Pain in thoracic spine: Secondary | ICD-10-CM | POA: Diagnosis not present

## 2022-02-19 DIAGNOSIS — Z7982 Long term (current) use of aspirin: Secondary | ICD-10-CM | POA: Diagnosis not present

## 2022-02-19 DIAGNOSIS — I499 Cardiac arrhythmia, unspecified: Secondary | ICD-10-CM | POA: Diagnosis not present

## 2022-02-19 MED ORDER — ACETAMINOPHEN 500 MG PO TABS
1000.0000 mg | ORAL_TABLET | Freq: Once | ORAL | Status: AC
  Administered 2022-02-19: 1000 mg via ORAL
  Filled 2022-02-19: qty 2

## 2022-02-19 MED ORDER — OXYCODONE HCL 5 MG PO TABS: 2.5000 mg | ORAL_TABLET | Freq: Four times a day (QID) | ORAL | 0 refills | Status: DC | PRN

## 2022-02-19 NOTE — Discharge Instructions (Addendum)
For pain control you may take at 1000 mg of Tylenol every 8 hours scheduled.  In addition you can take 0.5 to 1 tablet of Oxycodone every 6 hours as needed for pain not controlled with the scheduled Tylenol. ? ?

## 2022-02-19 NOTE — ED Triage Notes (Signed)
Pt arrives from [facility] independent living in [place] via [facility]. Pt sustained a fall on Friday night. She has been having lower back pain since then that was worse tonight. Pt normally uses a walker to ambulate. Initial bp was 200/112, hx of afib, repeat bp 178/82. DBG 164, hr 70's, 94% ra. Unsure if she hit her head when she fell.

## 2022-02-19 NOTE — ED Notes (Signed)
Called [facility] to transport patient back to the [facility].

## 2022-02-19 NOTE — ED Provider Notes (Signed)
[facility] EMERGENCY DEPARTMENT Provider Note  CSN: [id number] Arrival date & time: 02/19/22 9767  Chief Complaint(s) Fall ED Triage Notes Erick Colace, RN (Registered Nurse)   Nursing   Date of Service: 02/19/2022  5:13 AM   Signed   Pt arrives from [facility] in [place] via [facility]. Pt sustained a fall on Friday night. She has been having lower back pain since then that was worse tonight. Pt normally uses a walker to ambulate. Initial bp was 200/112, hx of afib, repeat bp 178/82. DBG 164, hr 70's, 94% ra. Unsure if she hit her head when she fell.     HPI [name] is a 88 y.o. female with a past medical history listed below who presents to the emergency department with mid back pain following a mechanical fall 3 days ago.  She reported that EMS was called but she declined to come in because she had very mild discomfort at that time.  She reports that the pain gradually worsened in those 2 days.  She was concerned that she might have injured some fractures in her back.  She denies any bladder/bowel incontinence.  No lower extremity weakness.  No loss of sensation.  She denies any chest pain or shortness of breath.  No headache or neck pain.  No other physical points.  The history is provided by the patient.    Past Medical History Past Medical History:  Diagnosis Date   CAD (coronary artery disease)    mild nonobstructive disease on cath in 2003   Cancer [facility])    Cardiomyopathy    Probable Takotsubo, severe CP w/ normal cath in 1994. Severe CP in 2003 w/ widespread T wave inversions on ECG. Cath w/ minimal coronary disease but LV-gram showed periapical severe hypokinesis and basilar hyperkinesia (EF 40%). Last echo in 4/09 confirmed full LV functional recovery with EF 60%, no regional wall motion abnormalities, mild to moderate LVH.   Chronic diastolic CHF (congestive heart failure) ([place]) 09/13/2019   CVA (cerebral infarction)     Depression with anxiety 03/29/2011   lost husband 3'09   DVT (deep venous thrombosis) ([place])    after venous ablation, R leg   Fracture 09/10/2007   L2, status post vertebroplasty of L2 performed by IR   GERD (gastroesophageal reflux disease)    Hemorrhoids    Hiatal hernia 03/29/2011   no nerve problems   Hyperlipidemia    Hyperplastic colon polyp 06/2001   Hypertension    Iron deficiency anemia due to chronic blood loss 12/30/2017   OA (osteoarthritis) of knee 03/29/2011   w/ bilateral knee pain-not a problem now   OSA (obstructive sleep apnea) 03/29/2011   no cpap used, not a problem now.   Osteoporosis    Otalgia of both ears    Dr. Simeon Craft   Polycythemia    Stroke [facility]) 03/29/2011   CVA x2 -last 10'12-?TIA(visual problems)   Varicose vein    Patient Active Problem List   Diagnosis Date Noted   Right elbow pain 02/08/2022   Laceration of right upper extremity 02/08/2022   Fall 11/25/2021   Closed right radial fracture 11/25/2021   Chin laceration 11/25/2021   Shoulder abrasion, right, initial encounter 11/25/2021   Supraorbital ecchymosis 11/25/2021   Protein-calorie malnutrition, severe 10/09/2021   Acute encephalopathy 10/07/2021   Compression fracture of T7 vertebra  10/07/2021   Rib pain on right side 01/24/2021   Skin rash 01/24/2021   Mastalgia 01/24/2021  PE (pulmonary thromboembolism) ([place]) 12/25/2020   Lumbar compression fracture (HCC) 11/16/2020   Generalized weakness 10/03/2020   GERD (gastroesophageal reflux disease)    Dyspnea    DVT (deep venous thrombosis) (H[facility])    Diverticulitis    CAD (coronary artery disease)    Acute midline thoracic back pain 03/04/2020   Pressure injury of skin 12/27/2019   S/p left hip fracture    Comminuted fracture of hip, left, open type I or II, initial encounter ([place]) 12/24/2019   Closed left hip fracture ([place]) 12/24/2019   ICH (intracerebral hemorrhage) ([place]) 11/04/2019   Intractable back pain 09/13/2019    Compression fracture of T9 vertebra ([place]) 09/13/2019   Chronic diastolic CHF (congestive heart failure) ([place]) 09/13/2019   Overweight (BMI 25.0-29.9) 09/13/2019   Constipation 09/13/2019   Raynaud's phenomenon without gangrene 05/19/2018   Iron deficiency anemia due to chronic blood loss 12/30/2017   Insomnia 06/25/2017   Mild CAD 05/24/2017   PCP NOTES >>>>>>>>> 02/15/2015   Superficial thrombophlebitis 04/06/2014   Annual physical exam 08/18/2012   Breast cancer ([place]) 11/05/2011   Polycythemia 11/05/2011   Stroke ([place]) 03/29/2011   OSA (obstructive sleep apnea) 03/29/2011   OA (osteoarthritis) of knee 03/29/2011   Hiatal hernia 03/29/2011   E. coli gastroenteritis 03/29/2011   Depression with anxiety 03/29/2011   Chest pain 01/16/2011   TIA (transient ischemic attack) 01/16/2011   CARDIOMYOPATHY 08/03/2009   DJD -- pain mgmt 08/16/2008   ABDOMINAL PAIN, LEFT LOWER QUADRANT 07/05/2008   GAIT DISTURBANCE 10/21/2007   ANXIETY DEPRESSION 10/08/2007   Fracture 09/10/2007   Hyperlipidemia 06/25/2007   Takotsubo cardiomyopathy 06/25/2007   GERD without esophagitis 06/25/2007   Essential hypertension 09/30/2006   Senile osteoporosis 09/30/2006   Migraine with aura 09/30/2006   Hyperplastic colon polyp 06/2001   Home Medication(s) Prior to Admission medications   Medication Sig Start Date End Date Taking? Authorizing Provider  oxyCODONE (ROXICODONE) 5 MG immediate release tablet Take 0.5-1 tablets (2.5-5 mg total) by mouth every 6 (six) hours as needed for up to 5 days for severe pain. 02/19/22 02/24/22 Yes Zyria Fiscus, Grayce Sessions, MD  acetaminophen (TYLENOL) 325 MG tablet Take 2 tablets (650 mg total) by mouth every 8 (eight) hours for 5 days, THEN 2 tablets (650 mg total) every 6 (six) hours as needed. 11/28/21 03/03/22  Mercy Riding, MD  alendronate (FOSAMAX) 70 MG tablet Take 1 tablet (70 mg total) by mouth once a week. Take with a full glass of water on an empty stomach. 12/19/21    Colon Branch, MD  amLODipine (NORVASC) 5 MG tablet Take 1 tablet (5 mg total) by mouth daily. 12/19/21   Colon Branch, MD  aspirin EC 81 MG tablet Take 81 mg by mouth daily. Swallow whole.    [provider]  doxycycline (VIBRA-TABS) 100 MG tablet Take 1 tablet (100 mg total) by mouth 2 (two) times daily. 02/06/22   Ann Held, DO  furosemide (LASIX) 20 MG tablet Take 1 tablet (20 mg total) by mouth every other day. 12/19/21   Colon Branch, MD  LORazepam (ATIVAN) 0.5 MG tablet Take 1 tablet (0.5 mg total) by mouth 2 (two) times daily as needed for anxiety. 02/01/22   Colon Branch, MD  metoprolol tartrate (LOPRESSOR) 50 MG tablet Take 1 tablet (50 mg total) by mouth 2 (two) times daily. 12/19/21   Colon Branch, MD  omeprazole (PRILOSEC) 20 MG capsule Take 1 capsule (20 mg total) by  mouth daily. 12/19/21   Colon Branch, MD  ondansetron (ZOFRAN) 4 MG tablet Take 1 tablet (4 mg total) by mouth every 8 (eight) hours as needed for nausea or vomiting. 02/14/22   Colon Branch, MD                                                                                                                                    Allergies Penicillins, Valsartan, Atorvastatin, Carvedilol, Ezetimibe, Fluvastatin sodium, Magnesium hydroxide, Meloxicam, Pneumovax [pneumococcal polysaccharide vaccine], Quinapril hcl, Simvastatin, Topamax [topiramate], and Vit d-vit e-safflower oil  Review of Systems Review of Systems As noted in HPI  Physical Exam Vital Signs  I have reviewed the triage vital signs BP (!) 172/84   Pulse 72   Temp (!) 97.1 F (36.2 C) (Temporal)   Resp 16   SpO2 96%   Physical Exam Constitutional:      General: She is not in acute distress.    Appearance: She is well-developed. She is not diaphoretic.  HENT:     Head: Normocephalic and atraumatic.     Right Ear: External ear normal.     Left Ear: External ear normal.     Nose: Nose normal.  Eyes:     General: No scleral icterus.       Right  eye: No discharge.        Left eye: No discharge.     Conjunctiva/sclera: Conjunctivae normal.     Pupils: Pupils are equal, round, and reactive to light.  Cardiovascular:     Rate and Rhythm: Normal rate and regular rhythm.     Pulses:          Radial pulses are 2+ on the right side and 2+ on the left side.       Dorsalis pedis pulses are 2+ on the right side and 2+ on the left side.     Heart sounds: Normal heart sounds. No murmur heard.    No friction rub. No gallop.  Pulmonary:     Effort: Pulmonary effort is normal. No respiratory distress.     Breath sounds: Normal breath sounds. No stridor. No wheezing.  Abdominal:     General: There is no distension.     Palpations: Abdomen is soft.     Tenderness: There is no abdominal tenderness.  Musculoskeletal:     Cervical back: Normal range of motion and neck supple. No bony tenderness.     Thoracic back: Tenderness and bony tenderness present.     Lumbar back: No bony tenderness.       Back:     Right hip: No tenderness. Normal range of motion.     Left hip: No tenderness. Normal range of motion.     Comments: Clavicles stable. Chest stable to AP/Lat compression. Pelvis stable to Lat compression. No obvious extremity deformity. No chest or abdominal wall contusion.  Skin:    General:  Skin is warm and dry.     Findings: No erythema or rash.  Neurological:     Mental Status: She is alert and oriented to person, place, and time.     Comments: Moving all extremities     ED Results and Treatments Labs (all labs ordered are listed, but only abnormal results are displayed) Labs Reviewed - No data to display                                                                                                                       EKG  EKG Interpretation  Date/Time:    Ventricular Rate:    PR Interval:    QRS Duration:   QT Interval:    QTC Calculation:   R Axis:     Text Interpretation:         Radiology DG Thoracic Spine  2 View  Result Date: 02/19/2022 CLINICAL DATA:  Fall.  Rib and back pain. EXAM: THORACIC SPINE 2 VIEWS COMPARISON:  CT thoracic spine 10/01/2021 FINDINGS: As on prior CT, the patient is status post vertebral augmentation at T4, T8, T9, T10, and T12. The mild T7 inferior endplate compression deformity seen on previous CT is progressive in the interval with 50-75% loss of height at this level on today's study. Mildly accentuated thoracic kyphosis at the T7-9 level. Bones are diffusely demineralized. IMPRESSION: 1. Interval progression of vertebral height loss at T7 compared to prior CT of 10/01/2021. 2. Multiple chronic compression fractures after vertebral augmentation at T4, T8, T9, T10. Electronically Signed   By: Misty Stanley M.D.   On: 02/19/2022 06:00    Medications Ordered in ED Medications  acetaminophen (TYLENOL) tablet 1,000 mg (1,000 mg Oral Given 02/19/22 0604)                                                                                                                                     Procedures Procedures  (including critical care time)  Medical Decision Making / ED Course   Medical Decision Making Amount and/or Complexity of Data Reviewed Radiology: ordered and independent interpretation performed. Decision-making details documented in ED Course.  Risk OTC drugs. Prescription drug management.    Mid back pain following a fall today. On review of records, patient has had prior kyphoplasty for compression fractures in thoracic spine. Neurovascular intact to lower extremities. No evidence of head trauma or neck pain requiring  CT imaging Plain film of the thoracic spine obtained and notable for worsening T7 fracture consistent with point tenderness on exam. Patient provided with oral Tylenol Recommended follow-up with spine surgeon.      Final Clinical Impression(s) / ED Diagnoses Final diagnoses:  Compression fracture of T7 vertebra, initial encounter [facility])    The patient appears reasonably screened and/or stabilized for discharge and I doubt any other medical condition or other [facility] requiring further screening, evaluation, or treatment in the ED at this time. I have discussed the findings, Dx and Tx plan with the patient/family who expressed understanding and agree(s) with the plan. Discharge instructions discussed at length. The patient/family was given strict return precautions who verbalized understanding of the instructions. No further questions at time of discharge.  Disposition: Discharge  Condition: Good  ED Discharge Orders          Ordered    oxyCODONE (ROXICODONE) 5 MG immediate release tablet  Every 6 hours PRN        02/19/22 0740            [place] narcotic database reviewed and no active prescriptions noted.   Follow Up: Melina Schools, [place] [address] [phone]  Call  to schedule an appointment for close follow up  Colon Branch, [address] [address] [phone]  Call             This chart was dictated using voice recognition software.  Despite best efforts to proofread,  errors can occur which can change the documentation meaning.    Fatima Blank, MD 02/19/22 [phone]

## 2022-02-19 NOTE — ED Notes (Signed)
Patient transported to X-ray 

## 2022-02-23 ENCOUNTER — Inpatient Hospital Stay (HOSPITAL_COMMUNITY)
Admission: EM | Admit: 2022-02-23 | Discharge: 2022-02-28 | DRG: 324 | Disposition: A | Discharge status: Skilled Nursing Facility | Payer: Medicare Other | Attending: Cardiology | Admitting: Cardiology

## 2022-02-23 ENCOUNTER — Inpatient Hospital Stay (HOSPITAL_COMMUNITY): Payer: Medicare Other

## 2022-02-23 ENCOUNTER — Inpatient Hospital Stay (HOSPITAL_COMMUNITY)
Admission: EM | Disposition: A | Discharge status: Skilled Nursing Facility | Payer: Self-pay | Source: Home / Self Care | Attending: Cardiology

## 2022-02-23 DIAGNOSIS — Z888 Allergy status to other drugs, medicaments and biological substances status: Secondary | ICD-10-CM

## 2022-02-23 DIAGNOSIS — I5042 Chronic combined systolic (congestive) and diastolic (congestive) heart failure: Secondary | ICD-10-CM | POA: Diagnosis present

## 2022-02-23 DIAGNOSIS — Z79899 Other long term (current) drug therapy: Secondary | ICD-10-CM

## 2022-02-23 DIAGNOSIS — I110 Hypertensive heart disease with heart failure: Secondary | ICD-10-CM | POA: Diagnosis not present

## 2022-02-23 DIAGNOSIS — Z955 Presence of coronary angioplasty implant and graft: Principal | ICD-10-CM

## 2022-02-23 DIAGNOSIS — Z853 Personal history of malignant neoplasm of breast: Secondary | ICD-10-CM

## 2022-02-23 DIAGNOSIS — I25110 Atherosclerotic heart disease of native coronary artery with unstable angina pectoris: Secondary | ICD-10-CM | POA: Diagnosis not present

## 2022-02-23 DIAGNOSIS — I428 Other cardiomyopathies: Secondary | ICD-10-CM | POA: Diagnosis not present

## 2022-02-23 DIAGNOSIS — C50919 Malignant neoplasm of unspecified site of unspecified female breast: Secondary | ICD-10-CM | POA: Diagnosis not present

## 2022-02-23 DIAGNOSIS — Z8249 Family history of ischemic heart disease and other diseases of the circulatory system: Secondary | ICD-10-CM | POA: Diagnosis not present

## 2022-02-23 DIAGNOSIS — Z8673 Personal history of transient ischemic attack (TIA), and cerebral infarction without residual deficits: Secondary | ICD-10-CM

## 2022-02-23 DIAGNOSIS — R6889 Other general symptoms and signs: Secondary | ICD-10-CM | POA: Diagnosis not present

## 2022-02-23 DIAGNOSIS — E785 Hyperlipidemia, unspecified: Secondary | ICD-10-CM | POA: Diagnosis not present

## 2022-02-23 DIAGNOSIS — I7300 Raynaud's syndrome without gangrene: Secondary | ICD-10-CM | POA: Diagnosis not present

## 2022-02-23 DIAGNOSIS — I499 Cardiac arrhythmia, unspecified: Secondary | ICD-10-CM | POA: Diagnosis not present

## 2022-02-23 DIAGNOSIS — F410 Panic disorder [episodic paroxysmal anxiety] without agoraphobia: Secondary | ICD-10-CM | POA: Diagnosis present

## 2022-02-23 DIAGNOSIS — I2111 ST elevation (STEMI) myocardial infarction involving right coronary artery: Secondary | ICD-10-CM | POA: Diagnosis not present

## 2022-02-23 DIAGNOSIS — Z803 Family history of malignant neoplasm of breast: Secondary | ICD-10-CM

## 2022-02-23 DIAGNOSIS — I2699 Other pulmonary embolism without acute cor pulmonale: Secondary | ICD-10-CM | POA: Diagnosis present

## 2022-02-23 DIAGNOSIS — G4733 Obstructive sleep apnea (adult) (pediatric): Secondary | ICD-10-CM | POA: Diagnosis present

## 2022-02-23 DIAGNOSIS — I82409 Acute embolism and thrombosis of unspecified deep veins of unspecified lower extremity: Secondary | ICD-10-CM | POA: Diagnosis present

## 2022-02-23 DIAGNOSIS — E871 Hypo-osmolality and hyponatremia: Secondary | ICD-10-CM | POA: Diagnosis not present

## 2022-02-23 DIAGNOSIS — Z800 Family history of malignant neoplasm of digestive organs: Secondary | ICD-10-CM

## 2022-02-23 DIAGNOSIS — Z743 Need for continuous supervision: Secondary | ICD-10-CM | POA: Diagnosis not present

## 2022-02-23 DIAGNOSIS — R2681 Unsteadiness on feet: Secondary | ICD-10-CM | POA: Diagnosis not present

## 2022-02-23 DIAGNOSIS — K219 Gastro-esophageal reflux disease without esophagitis: Secondary | ICD-10-CM | POA: Diagnosis present

## 2022-02-23 DIAGNOSIS — Z7982 Long term (current) use of aspirin: Secondary | ICD-10-CM

## 2022-02-23 DIAGNOSIS — I69828 Other speech and language deficits following other cerebrovascular disease: Secondary | ICD-10-CM | POA: Diagnosis not present

## 2022-02-23 DIAGNOSIS — D500 Iron deficiency anemia secondary to blood loss (chronic): Secondary | ICD-10-CM | POA: Diagnosis present

## 2022-02-23 DIAGNOSIS — Z86718 Personal history of other venous thrombosis and embolism: Secondary | ICD-10-CM

## 2022-02-23 DIAGNOSIS — R54 Age-related physical debility: Secondary | ICD-10-CM | POA: Diagnosis present

## 2022-02-23 DIAGNOSIS — I480 Paroxysmal atrial fibrillation: Secondary | ICD-10-CM | POA: Diagnosis present

## 2022-02-23 DIAGNOSIS — Z86711 Personal history of pulmonary embolism: Secondary | ICD-10-CM

## 2022-02-23 DIAGNOSIS — Z880 Allergy status to penicillin: Secondary | ICD-10-CM

## 2022-02-23 DIAGNOSIS — F32A Depression, unspecified: Secondary | ICD-10-CM | POA: Diagnosis present

## 2022-02-23 DIAGNOSIS — I10 Essential (primary) hypertension: Secondary | ICD-10-CM | POA: Diagnosis present

## 2022-02-23 DIAGNOSIS — I2584 Coronary atherosclerosis due to calcified coronary lesion: Secondary | ICD-10-CM | POA: Diagnosis present

## 2022-02-23 DIAGNOSIS — I5032 Chronic diastolic (congestive) heart failure: Secondary | ICD-10-CM | POA: Diagnosis present

## 2022-02-23 DIAGNOSIS — Z886 Allergy status to analgesic agent status: Secondary | ICD-10-CM

## 2022-02-23 DIAGNOSIS — Z806 Family history of leukemia: Secondary | ICD-10-CM

## 2022-02-23 DIAGNOSIS — M810 Age-related osteoporosis without current pathological fracture: Secondary | ICD-10-CM | POA: Diagnosis present

## 2022-02-23 DIAGNOSIS — Z8719 Personal history of other diseases of the digestive system: Secondary | ICD-10-CM

## 2022-02-23 DIAGNOSIS — Z96652 Presence of left artificial knee joint: Secondary | ICD-10-CM | POA: Diagnosis present

## 2022-02-23 DIAGNOSIS — R0789 Other chest pain: Secondary | ICD-10-CM | POA: Diagnosis not present

## 2022-02-23 DIAGNOSIS — Z48812 Encounter for surgical aftercare following surgery on the circulatory system: Secondary | ICD-10-CM | POA: Diagnosis not present

## 2022-02-23 DIAGNOSIS — R2689 Other abnormalities of gait and mobility: Secondary | ICD-10-CM | POA: Diagnosis not present

## 2022-02-23 DIAGNOSIS — M6281 Muscle weakness (generalized): Secondary | ICD-10-CM | POA: Diagnosis not present

## 2022-02-23 DIAGNOSIS — I2510 Atherosclerotic heart disease of native coronary artery without angina pectoris: Secondary | ICD-10-CM

## 2022-02-23 DIAGNOSIS — Z7983 Long term (current) use of bisphosphonates: Secondary | ICD-10-CM

## 2022-02-23 DIAGNOSIS — I69891 Dysphagia following other cerebrovascular disease: Secondary | ICD-10-CM | POA: Diagnosis not present

## 2022-02-23 DIAGNOSIS — R079 Chest pain, unspecified: Secondary | ICD-10-CM | POA: Diagnosis not present

## 2022-02-23 HISTORY — PX: LEFT HEART CATH AND CORONARY ANGIOGRAPHY: CATH118249

## 2022-02-23 LAB — CBC
HCT: 45.2 % (ref 36.0–46.0)
Hemoglobin: 15 g/dL (ref 12.0–15.0)
MCH: 29.6 pg (ref 26.0–34.0)
MCHC: 33.2 g/dL (ref 30.0–36.0)
MCV: 89.3 fL (ref 80.0–100.0)
Platelets: 353 10*3/uL (ref 150–400)
RBC: 5.06 MIL/uL (ref 3.87–5.11)
RDW: 13.2 % (ref 11.5–15.5)
WBC: 6.3 10*3/uL (ref 4.0–10.5)
nRBC: 0 % (ref 0.0–0.2)

## 2022-02-23 LAB — COMPREHENSIVE METABOLIC PANEL
ALT: 10 U/L (ref 0–44)
ALT: 8 U/L (ref 0–44)
AST: 22 U/L (ref 15–41)
AST: 20 U/L (ref 15–41)
Albumin: 3.3 g/dL — ABNORMAL LOW (ref 3.5–5.0)
Albumin: 2.5 g/dL — ABNORMAL LOW (ref 3.5–5.0)
Alkaline Phosphatase: 101 U/L (ref 38–126)
Alkaline Phosphatase: 76 U/L (ref 38–126)
Anion gap: 9 (ref 5–15)
Anion gap: 12 (ref 5–15)
BUN: 7 mg/dL — ABNORMAL LOW (ref 8–23)
BUN: 6 mg/dL — ABNORMAL LOW (ref 8–23)
CO2: 29 mmol/L (ref 22–32)
CO2: 25 mmol/L (ref 22–32)
Calcium: 8.6 mg/dL — ABNORMAL LOW (ref 8.9–10.3)
Calcium: 6.7 mg/dL — ABNORMAL LOW (ref 8.9–10.3)
Chloride: 98 mmol/L (ref 98–111)
Chloride: 100 mmol/L (ref 98–111)
Creatinine, Ser: 0.77 mg/dL (ref 0.44–1.00)
Creatinine, Ser: 0.59 mg/dL (ref 0.44–1.00)
GFR, Estimated: >60 mL/min (ref >60)
GFR, Estimated: >60 mL/min (ref >60)
Glucose, Bld: 139 mg/dL — ABNORMAL HIGH (ref 70–99)
Glucose, Bld: 307 mg/dL — ABNORMAL HIGH (ref 70–99)
Potassium: 3 mmol/L — ABNORMAL LOW (ref 3.5–5.1)
Potassium: 2.4 mmol/L — CL (ref 3.5–5.1)
Sodium: 136 mmol/L (ref 135–145)
Sodium: 137 mmol/L (ref 135–145)
Total Bilirubin: 0.9 mg/dL (ref 0.3–1.2)
Total Bilirubin: 0.9 mg/dL (ref 0.3–1.2)
Total Protein: 6 g/dL — ABNORMAL LOW (ref 6.5–8.1)
Total Protein: 4.4 g/dL — ABNORMAL LOW (ref 6.5–8.1)

## 2022-02-23 LAB — TROPONIN I (HIGH SENSITIVITY)
Troponin I (High Sensitivity): 382 ng/L (ref <18)
Troponin I (High Sensitivity): 740 ng/L (ref <18)
Troponin I (High Sensitivity): 1867 ng/L (ref <18)

## 2022-02-23 LAB — POCT I-STAT, CHEM 8
BUN: 8 mg/dL (ref 8–23)
Calcium, Ion: 1.21 mmol/L (ref 1.15–1.40)
Chloride: 94 mmol/L — ABNORMAL LOW (ref 98–111)
Creatinine, Ser: 0.7 mg/dL (ref 0.44–1.00)
Glucose, Bld: 140 mg/dL — ABNORMAL HIGH (ref 70–99)
HCT: 48 % — ABNORMAL HIGH (ref 36.0–46.0)
Hemoglobin: 16.3 g/dL — ABNORMAL HIGH (ref 12.0–15.0)
Potassium: 3.1 mmol/L — ABNORMAL LOW (ref 3.5–5.1)
Sodium: 137 mmol/L (ref 135–145)
TCO2: 27 mmol/L (ref 22–32)

## 2022-02-23 LAB — LIPID PANEL
Cholesterol: 150 mg/dL (ref 0–200)
HDL: 50 mg/dL (ref >40)
LDL Cholesterol: 90 mg/dL (ref 0–99)
Total CHOL/HDL Ratio: 3 RATIO
Triglycerides: 49 mg/dL (ref <150)
VLDL: 10 mg/dL (ref 0–40)

## 2022-02-23 LAB — CBC WITH DIFFERENTIAL/PLATELET
Abs Immature Granulocytes: 0.03 10*3/uL (ref 0.00–0.07)
Basophils Absolute: 0 10*3/uL (ref 0.0–0.1)
Basophils Relative: 1 %
Eosinophils Absolute: 0 10*3/uL (ref 0.0–0.5)
Eosinophils Relative: 1 %
HCT: 40.7 % (ref 36.0–46.0)
Hemoglobin: 12.8 g/dL (ref 12.0–15.0)
Immature Granulocytes: 1 %
Lymphocytes Relative: 7 %
Lymphs Abs: 0.4 10*3/uL — ABNORMAL LOW (ref 0.7–4.0)
MCH: 29.1 pg (ref 26.0–34.0)
MCHC: 31.4 g/dL (ref 30.0–36.0)
MCV: 92.5 fL (ref 80.0–100.0)
Monocytes Absolute: 0.5 10*3/uL (ref 0.1–1.0)
Monocytes Relative: 8 %
Neutro Abs: 5.2 10*3/uL (ref 1.7–7.7)
Neutrophils Relative %: 82 %
Platelets: 319 10*3/uL (ref 150–400)
RBC: 4.4 MIL/uL (ref 3.87–5.11)
RDW: 13.2 % (ref 11.5–15.5)
WBC: 6.2 10*3/uL (ref 4.0–10.5)
nRBC: 0 % (ref 0.0–0.2)

## 2022-02-23 LAB — HEMOGLOBIN A1C
Hgb A1c MFr Bld: 5.5 % (ref 4.8–5.6)
Hgb A1c MFr Bld: 5.5 % (ref 4.8–5.6)
Mean Plasma Glucose: 111.15 mg/dL
Mean Plasma Glucose: 111.15 mg/dL

## 2022-02-23 LAB — POCT I-STAT 7, (LYTES, BLD GAS, ICA,H+H)
Acid-Base Excess: 4 mmol/L — ABNORMAL HIGH (ref 0.0–2.0)
Bicarbonate: 29.6 mmol/L — ABNORMAL HIGH (ref 20.0–28.0)
Calcium, Ion: 1.2 mmol/L (ref 1.15–1.40)
HCT: 48 % — ABNORMAL HIGH (ref 36.0–46.0)
Hemoglobin: 16.3 g/dL — ABNORMAL HIGH (ref 12.0–15.0)
O2 Saturation: 95 %
Potassium: 3.1 mmol/L — ABNORMAL LOW (ref 3.5–5.1)
Sodium: 136 mmol/L (ref 135–145)
TCO2: 31 mmol/L (ref 22–32)
pCO2 arterial: 46.4 mmHg (ref 32–48)
pH, Arterial: 7.413 (ref 7.35–7.45)
pO2, Arterial: 74 mmHg — ABNORMAL LOW (ref 83–108)

## 2022-02-23 LAB — PROTIME-INR
INR: 1.1 (ref 0.8–1.2)
Prothrombin Time: 13.8 seconds (ref 11.4–15.2)

## 2022-02-23 LAB — TSH: TSH: 1.087 u[IU]/mL (ref 0.350–4.500)

## 2022-02-23 LAB — APTT: aPTT: 34 seconds (ref 24–36)

## 2022-02-23 LAB — MAGNESIUM: Magnesium: 1.5 mg/dL — ABNORMAL LOW (ref 1.7–2.4)

## 2022-02-23 SURGERY — LEFT HEART CATH AND CORONARY ANGIOGRAPHY
Anesthesia: LOCAL

## 2022-02-23 MED ORDER — HEPARIN SODIUM (PORCINE) 1000 UNIT/ML IJ SOLN
INTRAMUSCULAR | Status: AC
  Filled 2022-02-23: qty 10

## 2022-02-23 MED ORDER — HEPARIN SODIUM (PORCINE) 1000 UNIT/ML IJ SOLN
INTRAMUSCULAR | Status: DC | PRN
  Administered 2022-02-23: 2500 [IU] via INTRAVENOUS

## 2022-02-23 MED ORDER — POTASSIUM CHLORIDE CRYS ER 20 MEQ PO TBCR
60.0000 meq | EXTENDED_RELEASE_TABLET | ORAL | Status: AC
  Administered 2022-02-23 – 2022-02-24 (×2): 60 meq via ORAL
  Filled 2022-02-23 (×2): qty 3

## 2022-02-23 MED ORDER — OXYCODONE HCL 5 MG PO TABS
2.5000 mg | ORAL_TABLET | Freq: Four times a day (QID) | ORAL | Status: DC | PRN
  Administered 2022-02-24: 2.5 mg via ORAL
  Administered 2022-02-25 – 2022-02-28 (×8): 5 mg via ORAL
  Filled 2022-02-23 (×9): qty 1

## 2022-02-23 MED ORDER — SODIUM CHLORIDE 0.9 % IV SOLN: 250.0000 mL | INTRAVENOUS | Status: DC | PRN

## 2022-02-23 MED ORDER — SODIUM CHLORIDE 0.9% FLUSH
3.0000 mL | Freq: Two times a day (BID) | INTRAVENOUS | Status: DC
  Administered 2022-02-23 – 2022-02-28 (×7): 3 mL via INTRAVENOUS

## 2022-02-23 MED ORDER — ROSUVASTATIN CALCIUM 20 MG PO TABS
20.0000 mg | ORAL_TABLET | Freq: Every day | ORAL | Status: DC
  Administered 2022-02-23 – 2022-02-28 (×5): 20 mg via ORAL
  Filled 2022-02-23 (×7): qty 1

## 2022-02-23 MED ORDER — HEPARIN (PORCINE) 25000 UT/250ML-% IV SOLN
1300.0000 [IU]/h | INTRAVENOUS | Status: DC
  Administered 2022-02-24: 600 [IU]/h via INTRAVENOUS
  Administered 2022-02-25: 900 [IU]/h via INTRAVENOUS
  Administered 2022-02-26: 1300 [IU]/h via INTRAVENOUS
  Filled 2022-02-23 (×3): qty 250

## 2022-02-23 MED ORDER — POTASSIUM CHLORIDE CRYS ER 20 MEQ PO TBCR: 120.0000 meq | EXTENDED_RELEASE_TABLET | Freq: Once | ORAL | Status: DC

## 2022-02-23 MED ORDER — VERAPAMIL HCL 2.5 MG/ML IV SOLN
INTRAVENOUS | Status: AC
  Filled 2022-02-23: qty 2

## 2022-02-23 MED ORDER — AMIODARONE HCL IN DEXTROSE 360-4.14 MG/200ML-% IV SOLN
60.0000 mg/h | INTRAVENOUS | Status: DC
  Administered 2022-02-23 (×2): 60 mg/h via INTRAVENOUS
  Filled 2022-02-23 (×2): qty 200

## 2022-02-23 MED ORDER — MELATONIN 5 MG PO TABS
5.0000 mg | ORAL_TABLET | Freq: Every day | ORAL | Status: DC
  Administered 2022-02-23 – 2022-02-27 (×5): 5 mg via ORAL
  Filled 2022-02-23 (×5): qty 1

## 2022-02-23 MED ORDER — VERAPAMIL HCL 2.5 MG/ML IV SOLN
INTRAVENOUS | Status: DC | PRN
  Administered 2022-02-23: 10 mL via INTRA_ARTERIAL

## 2022-02-23 MED ORDER — LIDOCAINE HCL (PF) 1 % IJ SOLN
INTRAMUSCULAR | Status: DC | PRN
  Administered 2022-02-23: 2 mL

## 2022-02-23 MED ORDER — SODIUM CHLORIDE 0.9 % IV SOLN
INTRAVENOUS | Status: AC | PRN
  Administered 2022-02-23: 10 mL/h via INTRAVENOUS

## 2022-02-23 MED ORDER — LORAZEPAM 0.5 MG PO TABS
0.5000 mg | ORAL_TABLET | Freq: Two times a day (BID) | ORAL | Status: DC | PRN
  Administered 2022-02-23 – 2022-02-26 (×7): 0.5 mg via ORAL
  Filled 2022-02-23 (×9): qty 1

## 2022-02-23 MED ORDER — METOPROLOL TARTRATE 25 MG PO TABS
25.0000 mg | ORAL_TABLET | Freq: Two times a day (BID) | ORAL | Status: DC
  Administered 2022-02-23 – 2022-02-25 (×4): 25 mg via ORAL
  Filled 2022-02-23 (×4): qty 1

## 2022-02-23 MED ORDER — ACETAMINOPHEN 325 MG PO TABS
650.0000 mg | ORAL_TABLET | ORAL | Status: DC | PRN
  Administered 2022-02-24 – 2022-02-27 (×5): 650 mg via ORAL
  Filled 2022-02-23 (×5): qty 2

## 2022-02-23 MED ORDER — LORAZEPAM 0.5 MG PO TABS
0.5000 mg | ORAL_TABLET | Freq: Once | ORAL | Status: AC
  Administered 2022-02-23: 0.5 mg via ORAL
  Filled 2022-02-23: qty 1

## 2022-02-23 MED ORDER — AMIODARONE HCL IN DEXTROSE 360-4.14 MG/200ML-% IV SOLN
30.0000 mg/h | INTRAVENOUS | Status: DC
  Administered 2022-02-24 – 2022-02-26 (×5): 30 mg/h via INTRAVENOUS
  Filled 2022-02-23 (×6): qty 200

## 2022-02-23 MED ORDER — NITROGLYCERIN 1 MG/10 ML FOR IR/CATH LAB
INTRA_ARTERIAL | Status: AC
  Filled 2022-02-23: qty 10

## 2022-02-23 MED ORDER — LIDOCAINE HCL (PF) 1 % IJ SOLN
INTRAMUSCULAR | Status: AC
  Filled 2022-02-23: qty 30

## 2022-02-23 MED ORDER — TICAGRELOR 90 MG PO TABS
ORAL_TABLET | ORAL | Status: AC
  Filled 2022-02-23: qty 2

## 2022-02-23 MED ORDER — SODIUM CHLORIDE 0.9 % WEIGHT BASED INFUSION
1.0000 mL/kg/h | INTRAVENOUS | Status: AC
  Administered 2022-02-23: 1 mL/kg/h via INTRAVENOUS

## 2022-02-23 MED ORDER — ONDANSETRON HCL 4 MG/2ML IJ SOLN
4.0000 mg | Freq: Four times a day (QID) | INTRAMUSCULAR | Status: DC | PRN
  Administered 2022-02-25 – 2022-02-27 (×4): 4 mg via INTRAVENOUS
  Filled 2022-02-23 (×4): qty 2

## 2022-02-23 MED ORDER — CANGRELOR TETRASODIUM 50 MG IV SOLR
INTRAVENOUS | Status: AC
  Filled 2022-02-23: qty 50

## 2022-02-23 MED ORDER — NITROGLYCERIN 0.4 MG SL SUBL: 0.4000 mg | SUBLINGUAL_TABLET | SUBLINGUAL | Status: DC | PRN

## 2022-02-23 MED ORDER — SODIUM CHLORIDE 0.9% FLUSH: 3.0000 mL | INTRAVENOUS | Status: DC | PRN

## 2022-02-23 MED ORDER — IOHEXOL 350 MG/ML SOLN
INTRAVENOUS | Status: DC | PRN
  Administered 2022-02-23: 45 mL

## 2022-02-23 MED ORDER — TICAGRELOR 90 MG PO TABS
90.0000 mg | ORAL_TABLET | Freq: Two times a day (BID) | ORAL | Status: DC
  Administered 2022-02-23 – 2022-02-26 (×7): 90 mg via ORAL
  Filled 2022-02-23 (×7): qty 1

## 2022-02-23 MED ORDER — TICAGRELOR 90 MG PO TABS
ORAL_TABLET | ORAL | Status: DC | PRN
  Administered 2022-02-23: 180 mg via ORAL

## 2022-02-23 MED ORDER — AMIODARONE LOAD VIA INFUSION
150.0000 mg | Freq: Once | INTRAVENOUS | Status: AC
  Administered 2022-02-23: 150 mg via INTRAVENOUS
  Filled 2022-02-23: qty 83.34

## 2022-02-23 MED ORDER — ASPIRIN 81 MG PO TBEC
81.0000 mg | DELAYED_RELEASE_TABLET | Freq: Every day | ORAL | Status: DC
  Administered 2022-02-23 – 2022-02-28 (×5): 81 mg via ORAL
  Filled 2022-02-23 (×6): qty 1

## 2022-02-23 MED ORDER — HEPARIN (PORCINE) IN NACL 1000-0.9 UT/500ML-% IV SOLN
INTRAVENOUS | Status: DC | PRN
  Administered 2022-02-23 (×2): 500 mL

## 2022-02-23 MED ORDER — HEPARIN (PORCINE) IN NACL 1000-0.9 UT/500ML-% IV SOLN
INTRAVENOUS | Status: AC
  Filled 2022-02-23: qty 1000

## 2022-02-23 SURGICAL SUPPLY — 10 items
CATH 5FR JL3.5 JR4 ANG PIG MP (CATHETERS) IMPLANT
DEVICE RAD TR BAND REGULAR (VASCULAR PRODUCTS) IMPLANT
GLIDESHEATH SLEND SS 6F .021 (SHEATH) IMPLANT
GUIDEWIRE INQWIRE 1.5J.035X260 (WIRE) IMPLANT
INQWIRE 1.5J .035X260CM (WIRE) ×1
KIT ENCORE 26 ADVANTAGE (KITS) IMPLANT
KIT HEART LEFT (KITS) ×2 IMPLANT
PACK CARDIAC CATHETERIZATION (CUSTOM PROCEDURE TRAY) ×2 IMPLANT
TRANSDUCER W/STOPCOCK (MISCELLANEOUS) ×2 IMPLANT
TUBING CIL FLEX 10 FLL-RA (TUBING) ×2 IMPLANT

## 2022-02-23 NOTE — Progress Notes (Signed)
ANTICOAGULATION CONSULT NOTE - Initial Consult  Pharmacy Consult for Heparin Indication:  complex ostial RCA stenosis - awaiting atherectomy  Allergies  Allergen Reactions   Penicillins Hives, Rash and Other (See Comments)    Has patient had a PCN reaction causing immediate rash, facial/tongue/throat swelling, SOB or lightheadedness with hypotension: Yes Has patient had a PCN reaction causing severe rash involving mucus membranes or skin necrosis: yes Has patient had a PCN reaction that required hospitalization: No Has patient had a PCN reaction occurring within the last 10 years: no If all of the above answers are "NO", then may proceed with Cephalosporin use.  Other Reaction: OTHER REACTION   Valsartan Itching and Other (See Comments)   Atorvastatin Diarrhea   Carvedilol Other (See Comments)    "teeth hurt when I took it"    Ezetimibe Nausea Only   Fluvastatin Sodium Other (See Comments)    "Can't remember"   Magnesium Hydroxide Other (See Comments)    REACTION: triggers HAs   Meloxicam Other (See Comments)    Pt seeing auro's / spots.     Pneumovax [Pneumococcal Polysaccharide Vaccine] Other (See Comments)    Local reaction   Quinapril Hcl Other (See Comments)     "feelings of tiredness"   Simvastatin Diarrhea   Topamax [Topiramate] Itching   Vit D-Vit E-Safflower Oil Other (See Comments)    Headaches    Patient Measurements: Weight: 51.5 kg (113 lb 8.6 oz)  Vital Signs: Temp: 97.5 F (36.4 C) (10/13 2000) Temp Source: Axillary (10/13 2000) BP: 139/71 (10/13 2000) Pulse Rate: 85 (10/13 2000)  Labs: Recent Labs    02/23/22 1507 02/23/22 1510 02/23/22 1515 02/23/22 1741 02/23/22 2005  HGB 15.0 16.3* 16.3* 12.8  --   HCT 45.2 48.0* 48.0* 40.7  --   PLT 353  --   --  319  --   APTT 34  --   --   --   --   LABPROT 13.8  --   --   --   --   INR 1.1  --   --   --   --   CREATININE 0.77 0.70  --  0.59  --   TROPONINIHS 382*  --   --  740* 1,867*     Estimated Creatinine Clearance: 38 mL/min (by C-G formula based on SCr of 0.59 mg/dL).   Medical History: Past Medical History:  Diagnosis Date   CAD (coronary artery disease)    mild nonobstructive disease on cath in 2003   Cancer [facility])    Cardiomyopathy    Probable Takotsubo, severe CP w/ normal cath in 1994. Severe CP in 2003 w/ widespread T wave inversions on ECG. Cath w/ minimal coronary disease but LV-gram showed periapical severe hypokinesis and basilar hyperkinesia (EF 40%). Last echo in 4/09 confirmed full LV functional recovery with EF 60%, no regional wall motion abnormalities, mild to moderate LVH.   Chronic diastolic CHF (congestive heart failure) ([place]) 09/13/2019   CVA (cerebral infarction)    Depression with anxiety 03/29/2011   lost husband 3'09   DVT (deep venous thrombosis) ([place])    after venous ablation, R leg   Fracture 09/10/2007   L2, status post vertebroplasty of L2 performed by IR   GERD (gastroesophageal reflux disease)    Hemorrhoids    Hiatal hernia 03/29/2011   no nerve problems   Hyperlipidemia    Hyperplastic colon polyp 06/2001   Hypertension    Iron deficiency anemia due to chronic blood  loss 12/30/2017   OA (osteoarthritis) of knee 03/29/2011   w/ bilateral knee pain-not a problem now   OSA (obstructive sleep apnea) 03/29/2011   no cpap used, not a problem now.   Osteoporosis    Otalgia of both ears    Dr. Simeon Craft   Polycythemia    Stroke [facility]) 03/29/2011   CVA x2 -last 10'12-?TIA(visual problems)   Varicose vein     Medications:  Medications Prior to Admission  Medication Sig Dispense Refill Last Dose   acetaminophen (TYLENOL) 325 MG tablet Take 650 mg by mouth every 6 (six) hours as needed for mild pain.   Past Week   alendronate (FOSAMAX) 70 MG tablet Take 1 tablet (70 mg total) by mouth once a week. Take with a full glass of water on an empty stomach. 12 tablet 3 02/18/2022   amLODipine (NORVASC) 5 MG tablet Take 1 tablet (5 mg  total) by mouth daily. 90 tablet 1 02/22/2022   aspirin EC 81 MG tablet Take 81 mg by mouth daily. Swallow whole.   02/22/2022   furosemide (LASIX) 20 MG tablet Take 1 tablet (20 mg total) by mouth every other day. 45 tablet 1 02/22/2022   LORazepam (ATIVAN) 0.5 MG tablet Take 1 tablet (0.5 mg total) by mouth 2 (two) times daily as needed for anxiety. 60 tablet 2 02/22/2022   metoprolol tartrate (LOPRESSOR) 50 MG tablet Take 1 tablet (50 mg total) by mouth 2 (two) times daily. 180 tablet 1 02/22/2022 at 0800   omeprazole (PRILOSEC) 20 MG capsule Take 1 capsule (20 mg total) by mouth daily. 90 capsule 1 02/22/2022   ondansetron (ZOFRAN) 4 MG tablet Take 1 tablet (4 mg total) by mouth every 8 (eight) hours as needed for nausea or vomiting. 12 tablet 0 unknown   oxyCODONE (ROXICODONE) 5 MG immediate release tablet Take 0.5-1 tablets (2.5-5 mg total) by mouth every 6 (six) hours as needed for up to 5 days for severe pain. 20 tablet 0 02/22/2022   acetaminophen (TYLENOL) 325 MG tablet Take 2 tablets (650 mg total) by mouth every 8 (eight) hours for 5 days, THEN 2 tablets (650 mg total) every 6 (six) hours as needed. (Patient not taking: Reported on 02/23/2022) 100 tablet 2 Not Taking   doxycycline (VIBRA-TABS) 100 MG tablet Take 1 tablet (100 mg total) by mouth 2 (two) times daily. (Patient not taking: Reported on 02/23/2022) 20 tablet 0 Completed Course    Assessment: 88 y.o. presents as CODE STEMI. Found to have complex ostial RCA stenosis which will require staged PCI on Monday. To start heparin 2 hours post TR band removal. TR band to be removed ~2230. Hgb 12.8, plt wnl. No AC PTA.  Goal of Therapy:  Heparin level 0.3-0.7 units/ml Monitor platelets by anticoagulation protocol: Yes   Plan:  At [place], start heparin gtt at 600 units/hr Will f/u heparin level in 8 hours Daily heparin level and CBC  Sherlon Handing, PharmD, BCPS Please see amion for complete clinical pharmacist phone  list 02/23/2022,10:21 PM

## 2022-02-23 NOTE — H&P (Signed)
Cardiology Admission History and Physical   Patient ID: [name] MRN: [id number]; DOB: August 13, 1929   Admission date: 02/23/2022  PCP:  Colon Branch, [place] [facility] Cardiologist:  Shirlee More, MD        Chief Complaint:  STEMI  Patient Profile:   [name] is a 88 y.o. female with non-obs CAD 2003 Takotsubo CM w/ EF 40% (60% 2009), CVA, HTN, HLD, IDA, OA, OSA not on CPAP, DVT/PE no on A/C, HFpEF, falls, who is being seen 02/23/2022 for the evaluation of STEMI.  History of Present Illness:   Ms. Huffstetler says that she had an episode of chest pain for which she was seen but no cath.  I did not have records of this.  This morning, substernal chest pain was present when she woke up.  She rested, but the pain worsened.  She finally called EMS.  Her initial ECG was abnormal but not a STEMI.  However, a later ECG was a STEMI, inferolateral leads.  She took ASA 324 mg prior to EMS arrival.  The pain was a 10/10.  She did not get any nitro by EMS.  The pain was associated with shortness of breath, and nausea.  Her pain eased off and resolved spontaneously.  Her ECG improved after this.  Her ECG is not as abnormal as it was, but it is still not normal.  There is concerned that her symptoms will recur.  Because of this, she is being taken directly to the Cath Lab.   Past Medical History:  Diagnosis Date   CAD (coronary artery disease)    mild nonobstructive disease on cath in 2003   Cancer [facility])    Cardiomyopathy    Probable Takotsubo, severe CP w/ normal cath in 1994. Severe CP in 2003 w/ widespread T wave inversions on ECG. Cath w/ minimal coronary disease but LV-gram showed periapical severe hypokinesis and basilar hyperkinesia (EF 40%). Last echo in 4/09 confirmed full LV functional recovery with EF 60%, no regional wall motion abnormalities, mild to moderate LVH.   Chronic diastolic CHF (congestive heart failure) ([place]) 09/13/2019   CVA  (cerebral infarction)    Depression with anxiety 03/29/2011   lost husband 3'09   DVT (deep venous thrombosis) ([place])    after venous ablation, R leg   Fracture 09/10/2007   L2, status post vertebroplasty of L2 performed by IR   GERD (gastroesophageal reflux disease)    Hemorrhoids    Hiatal hernia 03/29/2011   no nerve problems   Hyperlipidemia    Hyperplastic colon polyp 06/2001   Hypertension    Iron deficiency anemia due to chronic blood loss 12/30/2017   OA (osteoarthritis) of knee 03/29/2011   w/ bilateral knee pain-not a problem now   OSA (obstructive sleep apnea) 03/29/2011   no cpap used, not a problem now.   Osteoporosis    Otalgia of both ears    Dr. Simeon Craft   Polycythemia    Stroke [facility]) 03/29/2011   CVA x2 -last 10'12-?TIA(visual problems)   Varicose vein     Past Surgical History:  Procedure Laterality Date   APPENDECTOMY     BACK SURGERY     BREAST BIOPSY     BREAST CYST EXCISION     BREAST LUMPECTOMY Left 2013   left stage I left breast cancer   CHOLECYSTECTOMY  04/09/2011   Procedure: LAPAROSCOPIC CHOLECYSTECTOMY WITH INTRAOPERATIVE CHOLANGIOGRAM;  Surgeon: Odis Hollingshead, MD;  Location: [facility];  Service: General;  Laterality: N/A;   Dental Extraction     L maxillary molar   INTRAMEDULLARY (IM) NAIL INTERTROCHANTERIC Left 12/25/2019   Procedure: INTRAMEDULLARY (IM) NAIL INTERTROCHANTRIC;  Surgeon: Paralee Cancel, MD;  Location: [facility] ORS;  Service: Orthopedics;  Laterality: Left;   IR KYPHO THORACIC WITH BONE BIOPSY  09/15/2019   IR KYPHO THORACIC WITH BONE BIOPSY  07/13/2020       IR KYPHO THORACIC WITH BONE BIOPSY  07/13/2020   IR VERTEBROPLASTY CERV/THOR BX INC UNI/BIL INC/INJECT/IMAGING  11/10/2019   KYPHOPLASTY N/A 11/16/2020   Procedure: T11,  L1,KYPHOPLASTY;  Surgeon: Melina Schools, MD;  Location: [facility];  Service: Orthopedics;  Laterality: N/A;   KYPHOSIS SURGERY  08/2007   cement used   OVARIAN CYST SURGERY     left   RADIOLOGY WITH ANESTHESIA N/A  11/10/2019   Procedure: VERTEBROPLASTY;  Surgeon: Luanne Bras, MD;  Location: [place];  Service: Radiology;  Laterality: N/A;   TONSILLECTOMY     TOTAL KNEE ARTHROPLASTY  06/2010   left   TUBAL LIGATION       Medications Prior to Admission: Prior to Admission medications   Medication Sig Start Date End Date Taking? Authorizing Provider  acetaminophen (TYLENOL) 325 MG tablet Take 2 tablets (650 mg total) by mouth every 8 (eight) hours for 5 days, THEN 2 tablets (650 mg total) every 6 (six) hours as needed. 11/28/21 03/03/22  Mercy Riding, MD  alendronate (FOSAMAX) 70 MG tablet Take 1 tablet (70 mg total) by mouth once a week. Take with a full glass of water on an empty stomach. 12/19/21   Colon Branch, MD  amLODipine (NORVASC) 5 MG tablet Take 1 tablet (5 mg total) by mouth daily. 12/19/21   Colon Branch, MD  aspirin EC 81 MG tablet Take 81 mg by mouth daily. Swallow whole.    [provider]  doxycycline (VIBRA-TABS) 100 MG tablet Take 1 tablet (100 mg total) by mouth 2 (two) times daily. 02/06/22   Ann Held, DO  furosemide (LASIX) 20 MG tablet Take 1 tablet (20 mg total) by mouth every other day. 12/19/21   Colon Branch, MD  LORazepam (ATIVAN) 0.5 MG tablet Take 1 tablet (0.5 mg total) by mouth 2 (two) times daily as needed for anxiety. 02/01/22   Colon Branch, MD  metoprolol tartrate (LOPRESSOR) 50 MG tablet Take 1 tablet (50 mg total) by mouth 2 (two) times daily. 12/19/21   Colon Branch, MD  omeprazole (PRILOSEC) 20 MG capsule Take 1 capsule (20 mg total) by mouth daily. 12/19/21   Colon Branch, MD  ondansetron (ZOFRAN) 4 MG tablet Take 1 tablet (4 mg total) by mouth every 8 (eight) hours as needed for nausea or vomiting. 02/14/22   Colon Branch, MD  oxyCODONE (ROXICODONE) 5 MG immediate release tablet Take 0.5-1 tablets (2.5-5 mg total) by mouth every 6 (six) hours as needed for up to 5 days for severe pain. 02/19/22 02/24/22  Fatima Blank, MD     Allergies:    Allergies   Allergen Reactions   Penicillins Hives, Rash and Other (See Comments)    Has patient had a PCN reaction causing immediate rash, facial/tongue/throat swelling, SOB or lightheadedness with hypotension: Yes Has patient had a PCN reaction causing severe rash involving mucus membranes or skin necrosis: yes Has patient had a PCN reaction that required hospitalization: No Has patient had a PCN reaction occurring within the last  10 years: no If all of the above answers are "NO", then may proceed with Cephalosporin use.  Other Reaction: OTHER REACTION   Valsartan Itching and Other (See Comments)   Atorvastatin Diarrhea   Carvedilol Other (See Comments)    "teeth hurt when I took it"    Ezetimibe Nausea Only   Fluvastatin Sodium Other (See Comments)    "Can't remember"   Magnesium Hydroxide Other (See Comments)    REACTION: triggers HAs   Meloxicam Other (See Comments)    Pt seeing auro's / spots.     Pneumovax [Pneumococcal Polysaccharide Vaccine] Other (See Comments)    Local reaction   Quinapril Hcl Other (See Comments)     "feelings of tiredness"   Simvastatin Diarrhea   Topamax [Topiramate] Itching   Vit D-Vit E-Safflower Oil Other (See Comments)    Headaches    Social History:   Social History   Socioeconomic History   Marital status: Widowed    Spouse name: Not on file   Number of children: 3   Years of education: Not on file   Highest education level: Not on file  Occupational History   Occupation: n/a  Tobacco Use   Smoking status: Some Days    Packs/day: 1.00    Years: 37.00    Total pack years: 37.00    Types: Cigarettes    Start date: 06/26/1951   Smokeless tobacco: Never   Tobacco comments:    quit 25 years ago  Vaping Use   Vaping Use: Never used  Substance and Sexual Activity   Alcohol use: Not Currently    Alcohol/week: 1.0 standard drink of alcohol    Types: 1 Glasses of wine per week   Drug use: No   Sexual activity: Not Currently  Other Topics  Concern   Not on file  Social History Narrative   Lost husband 07/29/07-    Lives in [place]   has 3 daughters, lost Mickel Baas (03-2017)   Moved to ALF 04-2021   Early Chars lives in [place], [place] ( [place])     Not driving as of [phone]            Social Determinants of Health   Financial Resource Strain: Low Risk  (08/10/2021)   Overall Financial Resource Strain (CARDIA)    Difficulty of Paying Living Expenses: Not hard at all  Food Insecurity: Not on file  Transportation Needs: No Transportation Needs (08/10/2021)   PRAPARE - Hydrologist (Medical): No    Lack of Transportation (Non-Medical): No  Physical Activity: Not on file  Stress: Not on file  Social Connections: Not on file  Intimate Partner Violence: Not on file    Family History:   The patient's family history includes Breast cancer in her mother and sister; Colon cancer in an other family member; Heart disease in her father; Leukemia in her brother.    ROS:  Please see the history of present illness.  All other ROS reviewed and negative.     Physical Exam/Data:  There were no vitals filed for this visit. No intake or output data in the 24 hours ending 02/23/22 1504    12/19/2021    3:33 PM 11/25/2021    1:55 AM 11/24/2021    7:57 PM  Last 3 Weights  Weight (lbs) 113 lb 8 oz 116 lb 6.5 oz 127 lb  Weight (kg) 51.483 kg 52.8 kg 57.607 kg     There is no height or weight  on file to calculate BMI.  General: Frail elderly female, in no acute distress HEENT: normal Neck: no JVD Vascular: No carotid bruits; Distal pulses 2+ bilaterally   Cardiac:  normal S1, S2; RRR; no murmur  Lungs:  clear to auscultation bilaterally, no wheezing, rhonchi or rales  Abd: soft, nontender, no hepatomegaly  Ext: no edema Musculoskeletal:  No deformities, BUE and BLE strength weak but equal Skin: warm and dry  Neuro:  CNs 2-12 intact, no focal abnormalities noted Psych:  Normal affect    EKG:  The ECG that was  done by EMS was personally reviewed and demonstrates ST elevation in inferior and lateral leads with reciprocal changes in anterior leads.  It is atrial fibrillation which is a new diagnosis  Relevant CV Studies:  ECHO: 05/21/2018 - Left ventricle: The cavity size was normal. Wall thickness was    normal. Systolic function was normal. The estimated ejection    fraction was in the range of 60% to 65%. Wall motion was normal;    there were no regional wall motion abnormalities. Doppler    parameters are consistent with abnormal left ventricular    relaxation (grade 1 diastolic dysfunction).  - Mitral valve: There was mild regurgitation.  - Left atrium: The atrium was moderately dilated.   CARDIAC CATH: 07/14/2001 CORONARY CINEANGIOGRAPHY: Left coronary artery: The ostium appears normal. The bulk of the left main appears normal with a plaque at its distal end at the trifurcation. This plaque appears in the average of use to be approximately 25% stenotic. There is good antegrade flow into the three vessels this supplies.   Left anterior descending: The LAD has minor irregularities in its proximal and middle segment, but has normal antegrade flow and otherwise normal runoff distally. The septal and anterolateral branches appear normal. Intermediate branch, appears normal.   Circumflex coronary artery: The circumflex has an ostial lesion which is a continuation of the distal LAD plaque showing a 25% ostial stenosis. The middle and distal segments of the circumflex appear normal. There is normal antegrade flow and normal distal runoff.   Right coronary artery: The right coronary artery is a large dominant vessel supplying the posterior descending and posterolateral circulation. The ostium appears normal. There is several minor plaques in the proximal to mid segment with a focal eccentric 25% stenotic lesions. There is normal antegrade flow and normal distal runoff.   LEFT VENTRICULAR  CINEANGIOGRAM: The left ventricular chamber size appears normal. There is severe anteroapical akinesia with normal to hyperdynamic contraction of the anterior base and the inferior base and inferior segment. The overall left ventricular contractility is mildly decreased between 40-50%. The mitral and aortic valves appear normal.   ABDOMINAL AORTOGRAM: The suprarenal abdominal aorta appears normal.  The renal arteries both appear normal.  The infrarenal abdominal aorta has mild atherosclerotic plaque which is nonobstructive and there is no aneurysmal dilatation. There are normal-appearing common iliac arteries. There is normal flow distally.   FINAL DIAGNOSES: 1. Mild coronary artery disease with 25% distal left main stenosis,    25% ostial circumflex stenosis and 25% proximal right coronary artery    stenosis. 2. Normal antegrade flow and normal distal runoff in all coronary arteries. 3. Severe antral-apical akinesia with hyperdynamic inferior and basilar    contractility. Ejection fraction overall 40-50%. 4. Mild atherosclerosis of the distal abdominal aorta with normal renal    arteries. 5. Successful Perclose of the right femoral artery.   DISPOSITION: When review of all aspects with the  marked antral-apical akinesia along with a minor increase in cardiac enzymes and severe anterior chest pain, we have the hope that this represents a sudden closure and spontaneous reopening, and hopefully stunned myocardium which will recover fully. Certainly, she is not a candidate for angioplasty or bypass graft surgery, and we will follow her progress with further enzymes overnight, repeat ECG in the a.m. and hopefully be able to discharge tomorrow. We will then follow with an echocardiogram in the office to assess her left ventricular function in the future.  Laboratory Data:  High Sensitivity Troponin:  No results for input(s): "TROPONINIHS" in the last 720 hours.    ChemistryNo results  for input(s): "NA", "K", "CL", "CO2", "GLUCOSE", "BUN", "CREATININE", "CALCIUM", "MG", "GFRNONAA", "GFRAA", "ANIONGAP" in the last 168 hours.  No results for input(s): "PROT", "ALBUMIN", "AST", "ALT", "ALKPHOS", "BILITOT" in the last 168 hours. Lipids No results for input(s): "CHOL", "TRIG", "HDL", "LABVLDL", "LDLCALC", "CHOLHDL" in the last 168 hours. HematologyNo results for input(s): "WBC", "RBC", "HGB", "HCT", "MCV", "MCH", "MCHC", "RDW", "PLT" in the last 168 hours. Thyroid No results for input(s): "TSH", "FREET4" in the last 168 hours. BNPNo results for input(s): "BNP", "PROBNP" in the last 168 hours.  DDimer No results for input(s): "DDIMER" in the last 168 hours.   Radiology/Studies:  No results found.   Assessment and Plan:   Inferior STEMI -Her pain resolved just before arrival to the ER, but her ECG is still significantly abnormal, although improved -She is being taken directly to the Cath Lab with further evaluation and treatment depending on results -Her daughter was contacted and is aware of the procedure -She will be continued on her home medications -She will be screened for CRF's and their control  2.  Atrial fibrillation -This is a new diagnosis for her     Risk Assessment/Risk Scores:    TIMI Risk Score for ST  Elevation MI:   The patient's TIMI risk score is 6, which indicates a 16.1% risk of all cause mortality at 30 days.   New York Heart Association (NYHA) Functional Class NYHA Class II  CHA2DS2-VASc Score = 8   This indicates a 10.8% annual risk of stroke. The patient's score is based upon: CHF History: 1 HTN History: 1 Diabetes History: 0 Stroke History: 2 Vascular Disease History: 1 Age Score: 2 Gender Score: 1    Severity of Illness: The appropriate patient status for this patient is INPATIENT. Inpatient status is judged to be reasonable and necessary in order to provide the required intensity of service to ensure the patient's safety. The  patient's presenting symptoms, physical exam findings, and initial radiographic and laboratory data in the context of their chronic comorbidities is felt to place them at high risk for further clinical deterioration. Furthermore, it is not anticipated that the patient will be medically stable for discharge from the hospital within 2 midnights of admission.   * I certify that at the point of admission it is my clinical judgment that the patient will require inpatient hospital care spanning beyond 2 midnights from the point of admission due to high intensity of service, high risk for further deterioration and high frequency of surveillance required.*   For questions or updates, please contact [facility] Please consult [email or web address] for contact info under     Signed, Rosaria Ferries, PA-C  02/23/2022 3:04 PM

## 2022-02-24 ENCOUNTER — Inpatient Hospital Stay (HOSPITAL_COMMUNITY): Payer: Medicare Other

## 2022-02-24 DIAGNOSIS — I2111 ST elevation (STEMI) myocardial infarction involving right coronary artery: Secondary | ICD-10-CM | POA: Diagnosis not present

## 2022-02-24 LAB — BASIC METABOLIC PANEL
Anion gap: 4 — ABNORMAL LOW (ref 5–15)
BUN: 7 mg/dL — ABNORMAL LOW (ref 8–23)
CO2: 27 mmol/L (ref 22–32)
Calcium: 8.8 mg/dL — ABNORMAL LOW (ref 8.9–10.3)
Chloride: 103 mmol/L (ref 98–111)
Creatinine, Ser: 0.67 mg/dL (ref 0.44–1.00)
GFR, Estimated: >60 mL/min (ref >60)
Glucose, Bld: 141 mg/dL — ABNORMAL HIGH (ref 70–99)
Potassium: 4.7 mmol/L (ref 3.5–5.1)
Sodium: 134 mmol/L — ABNORMAL LOW (ref 135–145)

## 2022-02-24 LAB — CBC
HCT: 45.7 % (ref 36.0–46.0)
Hemoglobin: 14.8 g/dL (ref 12.0–15.0)
MCH: 29.6 pg (ref 26.0–34.0)
MCHC: 32.4 g/dL (ref 30.0–36.0)
MCV: 91.4 fL (ref 80.0–100.0)
Platelets: 357 10*3/uL (ref 150–400)
RBC: 5 MIL/uL (ref 3.87–5.11)
RDW: 13.3 % (ref 11.5–15.5)
WBC: 6.9 10*3/uL (ref 4.0–10.5)
nRBC: 0 % (ref 0.0–0.2)

## 2022-02-24 LAB — LIPID PANEL
Cholesterol: 130 mg/dL (ref 0–200)
HDL: 47 mg/dL (ref >40)
LDL Cholesterol: 74 mg/dL (ref 0–99)
Total CHOL/HDL Ratio: 2.8 RATIO
Triglycerides: 43 mg/dL (ref <150)
VLDL: 9 mg/dL (ref 0–40)

## 2022-02-24 LAB — ECHOCARDIOGRAM COMPLETE
AR max vel: 1.79 cm2
AV Area VTI: 1.73 cm2
AV Area mean vel: 1.6 cm2
AV Mean grad: 10 mmHg
AV Peak grad: 16.3 mmHg
Ao pk vel: 2.02 m/s
Area-P 1/2: 5.97 cm2
S' Lateral: 2.6 cm
Weight: 1816.59 oz

## 2022-02-24 LAB — HEPARIN LEVEL (UNFRACTIONATED)
Heparin Unfractionated: <0.1 IU/mL — ABNORMAL LOW (ref 0.30–0.70)
Heparin Unfractionated: 0.1 IU/mL — ABNORMAL LOW (ref 0.30–0.70)

## 2022-02-24 MED ORDER — AMLODIPINE BESYLATE 5 MG PO TABS
5.0000 mg | ORAL_TABLET | Freq: Every day | ORAL | Status: DC
  Administered 2022-02-24 – 2022-02-28 (×4): 5 mg via ORAL
  Filled 2022-02-24 (×5): qty 1

## 2022-02-24 MED ORDER — LORAZEPAM 0.5 MG PO TABS
0.5000 mg | ORAL_TABLET | Freq: Once | ORAL | Status: AC
  Administered 2022-02-24: 0.5 mg via ORAL

## 2022-02-24 MED ORDER — CHLORHEXIDINE GLUCONATE CLOTH 2 % EX PADS
6.0000 | MEDICATED_PAD | Freq: Every day | CUTANEOUS | Status: DC
  Administered 2022-02-24 – 2022-02-25 (×2): 6 via TOPICAL

## 2022-02-24 NOTE — Progress Notes (Signed)
  Echocardiogram 2D Echocardiogram has been performed.  [name] F 02/24/2022, 4:18 PM

## 2022-02-24 NOTE — Progress Notes (Signed)
[facility] for Heparin Indication:  complex ostial RCA stenosis - awaiting atherectomy  Allergies  Allergen Reactions   Penicillins Hives, Rash and Other (See Comments)    Has patient had a PCN reaction causing immediate rash, facial/tongue/throat swelling, SOB or lightheadedness with hypotension: Yes Has patient had a PCN reaction causing severe rash involving mucus membranes or skin necrosis: yes Has patient had a PCN reaction that required hospitalization: No Has patient had a PCN reaction occurring within the last 10 years: no If all of the above answers are "NO", then may proceed with Cephalosporin use.  Other Reaction: OTHER REACTION   Valsartan Itching and Other (See Comments)   Atorvastatin Diarrhea   Carvedilol Other (See Comments)    "teeth hurt when I took it"    Ezetimibe Nausea Only   Fluvastatin Sodium Other (See Comments)    "Can't remember"   Magnesium Hydroxide Other (See Comments)    REACTION: triggers HAs   Meloxicam Other (See Comments)    Pt seeing auro's / spots.     Pneumovax [Pneumococcal Polysaccharide Vaccine] Other (See Comments)    Local reaction   Quinapril Hcl Other (See Comments)     "feelings of tiredness"   Simvastatin Diarrhea   Topamax [Topiramate] Itching   Vit D-Vit E-Safflower Oil Other (See Comments)    Headaches    Patient Measurements: Weight: 51.5 kg (113 lb 8.6 oz)  Vital Signs: Temp: 97.7 F (36.5 C) (10/14 1533) Temp Source: Oral (10/14 1533) BP: 153/85 (10/14 1700) Pulse Rate: 90 (10/14 1700)  Labs: Recent Labs    02/23/22 1507 02/23/22 1510 02/23/22 1515 02/23/22 1741 02/23/22 2005 02/24/22 0811 02/24/22 1823  HGB 15.0 16.3* 16.3* 12.8  --  14.8  --   HCT 45.2 48.0* 48.0* 40.7  --  45.7  --   PLT 353  --   --  319  --  357  --   APTT 34  --   --   --   --   --   --   LABPROT 13.8  --   --   --   --   --   --   INR 1.1  --   --   --   --   --   --   HEPARINUNFRC  --   --    --   --   --  <0.10* 0.10*  CREATININE 0.77 0.70  --  0.59  --  0.67  --   TROPONINIHS 382*  --   --  740* 1,867*  --   --      Estimated Creatinine Clearance: 38 mL/min (by C-G formula based on SCr of 0.67 mg/dL).   Medical History: Past Medical History:  Diagnosis Date   CAD (coronary artery disease)    mild nonobstructive disease on cath in 2003   Cancer [facility])    Cardiomyopathy    Probable Takotsubo, severe CP w/ normal cath in 1994. Severe CP in 2003 w/ widespread T wave inversions on ECG. Cath w/ minimal coronary disease but LV-gram showed periapical severe hypokinesis and basilar hyperkinesia (EF 40%). Last echo in 4/09 confirmed full LV functional recovery with EF 60%, no regional wall motion abnormalities, mild to moderate LVH.   Chronic diastolic CHF (congestive heart failure) ([place]) 09/13/2019   CVA (cerebral infarction)    Depression with anxiety 03/29/2011   lost husband 3'09   DVT (deep venous thrombosis) ([place])    after venous ablation,  R leg   Fracture 09/10/2007   L2, status post vertebroplasty of L2 performed by IR   GERD (gastroesophageal reflux disease)    Hemorrhoids    Hiatal hernia 03/29/2011   no nerve problems   Hyperlipidemia    Hyperplastic colon polyp 06/2001   Hypertension    Iron deficiency anemia due to chronic blood loss 12/30/2017   OA (osteoarthritis) of knee 03/29/2011   w/ bilateral knee pain-not a problem now   OSA (obstructive sleep apnea) 03/29/2011   no cpap used, not a problem now.   Osteoporosis    Otalgia of both ears    Dr. Simeon Craft   Polycythemia    Stroke [facility]) 03/29/2011   CVA x2 -last 10'12-?TIA(visual problems)   Varicose vein     Medications:  Medications Prior to Admission  Medication Sig Dispense Refill Last Dose   acetaminophen (TYLENOL) 325 MG tablet Take 650 mg by mouth every 6 (six) hours as needed for mild pain.   Past Week   alendronate (FOSAMAX) 70 MG tablet Take 1 tablet (70 mg total) by mouth once a week. Take  with a full glass of water on an empty stomach. 12 tablet 3 02/18/2022   amLODipine (NORVASC) 5 MG tablet Take 1 tablet (5 mg total) by mouth daily. 90 tablet 1 02/22/2022   aspirin EC 81 MG tablet Take 81 mg by mouth daily. Swallow whole.   02/22/2022   furosemide (LASIX) 20 MG tablet Take 1 tablet (20 mg total) by mouth every other day. 45 tablet 1 02/22/2022   LORazepam (ATIVAN) 0.5 MG tablet Take 1 tablet (0.5 mg total) by mouth 2 (two) times daily as needed for anxiety. 60 tablet 2 02/22/2022   metoprolol tartrate (LOPRESSOR) 50 MG tablet Take 1 tablet (50 mg total) by mouth 2 (two) times daily. 180 tablet 1 02/22/2022 at 0800   omeprazole (PRILOSEC) 20 MG capsule Take 1 capsule (20 mg total) by mouth daily. 90 capsule 1 02/22/2022   ondansetron (ZOFRAN) 4 MG tablet Take 1 tablet (4 mg total) by mouth every 8 (eight) hours as needed for nausea or vomiting. 12 tablet 0 unknown   oxyCODONE (ROXICODONE) 5 MG immediate release tablet Take 0.5-1 tablets (2.5-5 mg total) by mouth every 6 (six) hours as needed for up to 5 days for severe pain. 20 tablet 0 02/22/2022   acetaminophen (TYLENOL) 325 MG tablet Take 2 tablets (650 mg total) by mouth every 8 (eight) hours for 5 days, THEN 2 tablets (650 mg total) every 6 (six) hours as needed. (Patient not taking: Reported on 02/23/2022) 100 tablet 2 Not Taking   doxycycline (VIBRA-TABS) 100 MG tablet Take 1 tablet (100 mg total) by mouth 2 (two) times daily. (Patient not taking: Reported on 02/23/2022) 20 tablet 0 Completed Course    Assessment: 88 y.o. presents as CODE STEMI. Found to have complex ostial RCA stenosis which will require staged PCI on 10/16 or 10/17. Heparin was started 2 hours post TR band removal on 10/13.   Heparin level still low low at 0.1 after rate adjustment this morning. No bleeding or IV issues noted.   Goal of Therapy:  Heparin level 0.3-0.7 units/ml Monitor platelets by anticoagulation protocol: Yes   Plan:  No bolus with  recent PCI Increase heparin gtt to 900 units/hr Will f/u heparin level in 8 hours Daily heparin level and CBC  Thank you for allowing pharmacy to participate in this patient's care.  Erin Hearing PharmD., BCPS Clinical Pharmacist 02/24/2022 7:01  PM

## 2022-02-24 NOTE — Progress Notes (Signed)
ANTICOAGULATION CONSULT NOTE - Initial Consult  Pharmacy Consult for Heparin Indication:  complex ostial RCA stenosis - awaiting atherectomy  Allergies  Allergen Reactions   Penicillins Hives, Rash and Other (See Comments)    Has patient had a PCN reaction causing immediate rash, facial/tongue/throat swelling, SOB or lightheadedness with hypotension: Yes Has patient had a PCN reaction causing severe rash involving mucus membranes or skin necrosis: yes Has patient had a PCN reaction that required hospitalization: No Has patient had a PCN reaction occurring within the last 10 years: no If all of the above answers are "NO", then may proceed with Cephalosporin use.  Other Reaction: OTHER REACTION   Valsartan Itching and Other (See Comments)   Atorvastatin Diarrhea   Carvedilol Other (See Comments)    "teeth hurt when I took it"    Ezetimibe Nausea Only   Fluvastatin Sodium Other (See Comments)    "Can't remember"   Magnesium Hydroxide Other (See Comments)    REACTION: triggers HAs   Meloxicam Other (See Comments)    Pt seeing auro's / spots.     Pneumovax [Pneumococcal Polysaccharide Vaccine] Other (See Comments)    Local reaction   Quinapril Hcl Other (See Comments)     "feelings of tiredness"   Simvastatin Diarrhea   Topamax [Topiramate] Itching   Vit D-Vit E-Safflower Oil Other (See Comments)    Headaches    Patient Measurements: Weight: 51.5 kg (113 lb 8.6 oz)  Vital Signs: Temp: 98.3 F (36.8 C) (10/14 0746) Temp Source: Oral (10/14 0746) BP: 140/69 (10/14 0934) Pulse Rate: 76 (10/14 0934)  Labs: Recent Labs    02/23/22 1507 02/23/22 1510 02/23/22 1515 02/23/22 1741 02/23/22 2005 02/24/22 0811  HGB 15.0 16.3* 16.3* 12.8  --  14.8  HCT 45.2 48.0* 48.0* 40.7  --  45.7  PLT 353  --   --  319  --  357  APTT 34  --   --   --   --   --   LABPROT 13.8  --   --   --   --   --   INR 1.1  --   --   --   --   --   HEPARINUNFRC  --   --   --   --   --  <0.10*   CREATININE 0.77 0.70  --  0.59  --  0.67  TROPONINIHS 382*  --   --  740* 1,867*  --      Estimated Creatinine Clearance: 38 mL/min (by C-G formula based on SCr of 0.67 mg/dL).   Medical History: Past Medical History:  Diagnosis Date   CAD (coronary artery disease)    mild nonobstructive disease on cath in 2003   Cancer [facility])    Cardiomyopathy    Probable Takotsubo, severe CP w/ normal cath in 1994. Severe CP in 2003 w/ widespread T wave inversions on ECG. Cath w/ minimal coronary disease but LV-gram showed periapical severe hypokinesis and basilar hyperkinesia (EF 40%). Last echo in 4/09 confirmed full LV functional recovery with EF 60%, no regional wall motion abnormalities, mild to moderate LVH.   Chronic diastolic CHF (congestive heart failure) ([place]) 09/13/2019   CVA (cerebral infarction)    Depression with anxiety 03/29/2011   lost husband 3'09   DVT (deep venous thrombosis) ([place])    after venous ablation, R leg   Fracture 09/10/2007   L2, status post vertebroplasty of L2 performed by IR   GERD (gastroesophageal reflux disease)  Hemorrhoids    Hiatal hernia 03/29/2011   no nerve problems   Hyperlipidemia    Hyperplastic colon polyp 06/2001   Hypertension    Iron deficiency anemia due to chronic blood loss 12/30/2017   OA (osteoarthritis) of knee 03/29/2011   w/ bilateral knee pain-not a problem now   OSA (obstructive sleep apnea) 03/29/2011   no cpap used, not a problem now.   Osteoporosis    Otalgia of both ears    Dr. Simeon Craft   Polycythemia    Stroke [facility]) 03/29/2011   CVA x2 -last 10'12-?TIA(visual problems)   Varicose vein     Medications:  Medications Prior to Admission  Medication Sig Dispense Refill Last Dose   acetaminophen (TYLENOL) 325 MG tablet Take 650 mg by mouth every 6 (six) hours as needed for mild pain.   Past Week   alendronate (FOSAMAX) 70 MG tablet Take 1 tablet (70 mg total) by mouth once a week. Take with a full glass of water on an empty  stomach. 12 tablet 3 02/18/2022   amLODipine (NORVASC) 5 MG tablet Take 1 tablet (5 mg total) by mouth daily. 90 tablet 1 02/22/2022   aspirin EC 81 MG tablet Take 81 mg by mouth daily. Swallow whole.   02/22/2022   furosemide (LASIX) 20 MG tablet Take 1 tablet (20 mg total) by mouth every other day. 45 tablet 1 02/22/2022   LORazepam (ATIVAN) 0.5 MG tablet Take 1 tablet (0.5 mg total) by mouth 2 (two) times daily as needed for anxiety. 60 tablet 2 02/22/2022   metoprolol tartrate (LOPRESSOR) 50 MG tablet Take 1 tablet (50 mg total) by mouth 2 (two) times daily. 180 tablet 1 02/22/2022 at 0800   omeprazole (PRILOSEC) 20 MG capsule Take 1 capsule (20 mg total) by mouth daily. 90 capsule 1 02/22/2022   ondansetron (ZOFRAN) 4 MG tablet Take 1 tablet (4 mg total) by mouth every 8 (eight) hours as needed for nausea or vomiting. 12 tablet 0 unknown   oxyCODONE (ROXICODONE) 5 MG immediate release tablet Take 0.5-1 tablets (2.5-5 mg total) by mouth every 6 (six) hours as needed for up to 5 days for severe pain. 20 tablet 0 02/22/2022   acetaminophen (TYLENOL) 325 MG tablet Take 2 tablets (650 mg total) by mouth every 8 (eight) hours for 5 days, THEN 2 tablets (650 mg total) every 6 (six) hours as needed. (Patient not taking: Reported on 02/23/2022) 100 tablet 2 Not Taking   doxycycline (VIBRA-TABS) 100 MG tablet Take 1 tablet (100 mg total) by mouth 2 (two) times daily. (Patient not taking: Reported on 02/23/2022) 20 tablet 0 Completed Course    Assessment: 88 y.o. presents as CODE STEMI. Found to have complex ostial RCA stenosis which will require staged PCI on 10/16 or 10/17. Heparin was started 2 hours post TR band removal on 10/13. Hgb stable at 14.8, plt wnl. Pt was in afib, now in SR on amio. No AC PTA.  Goal of Therapy:  Heparin level 0.3-0.7 units/ml Monitor platelets by anticoagulation protocol: Yes   Plan:  No bolus with recent PCI Increase heparin gtt to 750 units/hr Will f/u heparin level  in 8 hours Daily heparin level and CBC  Thank you for allowing pharmacy to participate in this patient's care.  Reatha Harps, PharmD PGY2 Pharmacy Resident 02/24/2022 10:27 AM Check [email or web address] for unit specific pharmacy number

## 2022-02-24 NOTE — Progress Notes (Signed)
Rounding Note    Patient Name: [name] Date of Encounter: 02/24/2022  [place] Cardiologist: Shirlee More, MD   Subjective   Feels well without chest pain. Did have a panic attack yesterday, but no associated chest pain. Takes ativan scheduled at home for anxiety.   Inpatient Medications    Scheduled Meds:  aspirin EC  81 mg Oral Daily   melatonin  5 mg Oral QHS   metoprolol tartrate  25 mg Oral BID   rosuvastatin  20 mg Oral Daily   sodium chloride flush  3 mL Intravenous Q12H   ticagrelor  90 mg Oral BID   Continuous Infusions:  sodium chloride     amiodarone 30 mg/hr (02/24/22 0900)   heparin 600 Units/hr (02/24/22 0900)   PRN Meds: sodium chloride, acetaminophen, LORazepam, nitroGLYCERIN, ondansetron (ZOFRAN) IV, oxyCODONE, sodium chloride flush   Vital Signs    Vitals:   02/24/22 0746 02/24/22 0800 02/24/22 0900 02/24/22 0934  BP:  139/76 (!) 155/79 (!) 140/69  Pulse:  78 73 76  Resp:  (!) 22 (!) 22   Temp: 98.3 F (36.8 C)     TempSrc: Oral     SpO2:  98% 98%   Weight:        Intake/Output Summary (Last 24 hours) at 02/24/2022 0946 Last data filed at 02/24/2022 0934 Gross per 24 hour  Intake 1735.58 ml  Output 1250 ml  Net 485.58 ml      02/23/2022    4:00 PM 12/19/2021    3:33 PM 11/25/2021    1:55 AM  Last 3 Weights  Weight (lbs) 113 lb 8.6 oz 113 lb 8 oz 116 lb 6.5 oz  Weight (kg) 51.5 kg 51.483 kg 52.8 kg      Telemetry    Sinus rhythm - Personally Reviewed  ECG    SR, 1st deg avb, poor R wave progression, resolution of inferior ST elevations and reciprocal change. - Personally Reviewed  Physical Exam   GEN: No acute distress.   Neck: No JVD Cardiac: RRR, no murmurs, rubs, or gallops.  Respiratory: Clear to auscultation bilaterally. GI: Soft, nontender, non-distended  MS: No edema; No deformity. Neuro:  Nonfocal  Psych: Normal affect   Labs    High Sensitivity Troponin:   Recent Labs  Lab  02/23/22 1507 02/23/22 1741 02/23/22 2005  TROPONINIHS 382* 740* 1,867*     Chemistry Recent Labs  Lab 02/23/22 1507 02/23/22 1510 02/23/22 1515 02/23/22 1741 02/24/22 0811  NA 136 137 136 137 134*  K 3.0* 3.1* 3.1* 2.4* 4.7  CL 98 94*  --  100 103  CO2 29  --   --  25 27  GLUCOSE 139* 140*  --  307* 141*  BUN 7* 8  --  6* 7*  CREATININE 0.77 0.70  --  0.59 0.67  CALCIUM 8.6*  --   --  6.7* 8.8*  MG  --   --   --  1.5*  --   PROT 6.0*  --   --  4.4*  --   ALBUMIN 3.3*  --   --  2.5*  --   AST 22  --   --  20  --   ALT 10  --   --  8  --   ALKPHOS 101  --   --  76  --   BILITOT 0.9  --   --  0.9  --   GFRNONAA >60  --   --  >  60 >60  ANIONGAP 9  --   --  12 4*    Lipids  Recent Labs  Lab 02/23/22 1507  CHOL 150  TRIG 49  HDL 50  LDLCALC 90  CHOLHDL 3.0    Hematology Recent Labs  Lab 02/23/22 1507 02/23/22 1510 02/23/22 1515 02/23/22 1741 02/24/22 0811  WBC 6.3  --   --  6.2 6.9  RBC 5.06  --   --  4.40 5.00  HGB 15.0   < > 16.3* 12.8 14.8  HCT 45.2   < > 48.0* 40.7 45.7  MCV 89.3  --   --  92.5 91.4  MCH 29.6  --   --  29.1 29.6  MCHC 33.2  --   --  31.4 32.4  RDW 13.2  --   --  13.2 13.3  PLT 353  --   --  319 357   < > = values in this interval not displayed.   Thyroid  Recent Labs  Lab 02/23/22 1741  TSH 1.087    BNPNo results for input(s): "BNP", "PROBNP" in the last 168 hours.  DDimer No results for input(s): "DDIMER" in the last 168 hours.   Radiology    CARDIAC CATHETERIZATION  Result Date: 02/23/2022   Mid LM to Dist LM lesion is 45% stenosed.   Mid LAD lesion is 30% stenosed.   Dist LAD lesion is 30% stenosed.   Ost Cx to Prox Cx lesion is 30% stenosed.   Ramus lesion is 20% stenosed.   Ost RCA to Prox RCA lesion is 95% stenosed.   The left ventricular systolic function is normal.   LV end diastolic pressure is normal.   The left ventricular ejection fraction is greater than 65% by visual estimate. Single vessel obstructive CAD with  severe ostial RCA stenosis with heavy calcification. There is TIMI 3 flow now and patient is pain free Vigorous LV function. Low LVEDP Plan: discussed with Dr Burt Knack. Patient has complex ostial RCA stenosis. It is heavily calcified. Will require atherectomy to treat adequately. I would recommend aggressive medical therapy to stabilize. Loaded with Brilinta. Will resume IV heparin once TR band is off. Start beta blocker, statin, IV amiodarone for Afib. IV hydration.  Plan for staged PCI of the LCx on Monday with atherectomy.    Cardiac Studies    Patient Profile     88 y.o. female with non-obs CAD 2003 Takotsubo CM w/ EF 40% (60% 2009), CVA, HTN, HLD, IDA, OA, OSA not on CPAP, DVT/PE not on A/C, HFpEF, falls, admitted for STEMI, with complex single vessel CAD in RCA likely requiring atherectomy, planned for staged procedure 10/16 or 10/17.     Assessment & Plan    Principal Problem:   STEMI involving right coronary artery [facility]) Active Problems:   ST elevation myocardial infarction involving right coronary artery (HCC)   Paroxysmal atrial fibrillation ([place])  Inferior STEMI -  - Plan for staged procedure for RCA with atherectomy on 10/16.  - loaded with brilinta, continue DAPT - IV heparin - metop tart 25 mg BID - crestor 20 mg daily   Afib - now in SR, on amiodarone. Would continue IV heparin and IV amiodarone through staged procedure.  HTN - BP mildly elevated, resume home amlodipine.  For questions or updates, please contact [place] Please consult [email or web address] for contact info under        Signed, Elouise Munroe, MD  02/24/2022, 9:46 AM

## 2022-02-25 DIAGNOSIS — E871 Hypo-osmolality and hyponatremia: Secondary | ICD-10-CM | POA: Diagnosis not present

## 2022-02-25 DIAGNOSIS — I2111 ST elevation (STEMI) myocardial infarction involving right coronary artery: Secondary | ICD-10-CM | POA: Diagnosis not present

## 2022-02-25 LAB — BASIC METABOLIC PANEL
Anion gap: 9 (ref 5–15)
BUN: 5 mg/dL — ABNORMAL LOW (ref 8–23)
CO2: 22 mmol/L (ref 22–32)
Calcium: 8.7 mg/dL — ABNORMAL LOW (ref 8.9–10.3)
Chloride: 102 mmol/L (ref 98–111)
Creatinine, Ser: 0.56 mg/dL (ref 0.44–1.00)
GFR, Estimated: >60 mL/min (ref >60)
Glucose, Bld: 117 mg/dL — ABNORMAL HIGH (ref 70–99)
Potassium: 4.2 mmol/L (ref 3.5–5.1)
Sodium: 133 mmol/L — ABNORMAL LOW (ref 135–145)

## 2022-02-25 LAB — CBC
HCT: 47.5 % — ABNORMAL HIGH (ref 36.0–46.0)
Hemoglobin: 15 g/dL (ref 12.0–15.0)
MCH: 28.9 pg (ref 26.0–34.0)
MCHC: 31.6 g/dL (ref 30.0–36.0)
MCV: 91.5 fL (ref 80.0–100.0)
Platelets: 319 10*3/uL (ref 150–400)
RBC: 5.19 MIL/uL — ABNORMAL HIGH (ref 3.87–5.11)
RDW: 13.3 % (ref 11.5–15.5)
WBC: 9.7 10*3/uL (ref 4.0–10.5)
nRBC: 0 % (ref 0.0–0.2)

## 2022-02-25 LAB — LIPOPROTEIN A (LPA): Lipoprotein (a): 56.7 nmol/L — ABNORMAL HIGH (ref <75.0)

## 2022-02-25 LAB — MAGNESIUM: Magnesium: 1.9 mg/dL (ref 1.7–2.4)

## 2022-02-25 LAB — HEPARIN LEVEL (UNFRACTIONATED)
Heparin Unfractionated: 0.24 IU/mL — ABNORMAL LOW (ref 0.30–0.70)
Heparin Unfractionated: 0.14 IU/mL — ABNORMAL LOW (ref 0.30–0.70)

## 2022-02-25 MED ORDER — METOPROLOL TARTRATE 50 MG PO TABS
50.0000 mg | ORAL_TABLET | Freq: Two times a day (BID) | ORAL | Status: DC
  Administered 2022-02-25 – 2022-02-28 (×5): 50 mg via ORAL
  Filled 2022-02-25 (×7): qty 1

## 2022-02-25 MED ORDER — METOPROLOL TARTRATE 25 MG PO TABS
25.0000 mg | ORAL_TABLET | Freq: Once | ORAL | Status: AC
  Administered 2022-02-25: 25 mg via ORAL
  Filled 2022-02-25: qty 1

## 2022-02-25 NOTE — H&P (View-Only) (Signed)
Rounding Note    Patient Name: [name] Date of Encounter: 02/25/2022  [facility] Cardiologist: Shirlee More, MD   Subjective   No chest pain. Continues to have anxiety.  Inpatient Medications    Scheduled Meds:  amLODipine  5 mg Oral Daily   aspirin EC  81 mg Oral Daily   Chlorhexidine Gluconate Cloth  6 each Topical Daily   melatonin  5 mg Oral QHS   metoprolol tartrate  25 mg Oral BID   rosuvastatin  20 mg Oral Daily   sodium chloride flush  3 mL Intravenous Q12H   ticagrelor  90 mg Oral BID   Continuous Infusions:  sodium chloride     amiodarone 30 mg/hr (02/25/22 0538)   heparin 900 Units/hr (02/25/22 0527)   PRN Meds: sodium chloride, acetaminophen, LORazepam, nitroGLYCERIN, ondansetron (ZOFRAN) IV, oxyCODONE, sodium chloride flush   Vital Signs    Vitals:   02/25/22 0200 02/25/22 0300 02/25/22 0400 02/25/22 0500  BP: 113/62 121/72 121/72 136/65  Pulse: 69 68 69 69  Resp: '17 16 17 19  '$ Temp:      TempSrc:      SpO2: 92% 93% 92% 91%  Weight:        Intake/Output Summary (Last 24 hours) at 02/25/2022 0601 Last data filed at 02/25/2022 0500 Gross per 24 hour  Intake 922.25 ml  Output 1650 ml  Net -727.75 ml      02/23/2022    4:00 PM 12/19/2021    3:33 PM 11/25/2021    1:55 AM  Last 3 Weights  Weight (lbs) 113 lb 8.6 oz 113 lb 8 oz 116 lb 6.5 oz  Weight (kg) 51.5 kg 51.483 kg 52.8 kg      Telemetry    Sinus rhythm - Personally Reviewed  ECG    SR, 1st deg avb, poor R wave progression, resolution of inferior ST elevations and reciprocal change. - Personally Reviewed  Physical Exam   GEN: Anxious, no physical distress Neck: No JVD Cardiac: RRR, no murmurs, rubs, or gallops.  Respiratory: Clear to auscultation bilaterally. GI: Soft, nontender, non-distended  MS: No edema; No deformity. Neuro:  Nonfocal  Psych: anxious.  Labs    High Sensitivity Troponin:   Recent Labs  Lab 02/23/22 1507 02/23/22 1741  02/23/22 2005  TROPONINIHS 382* 740* 1,867*     Chemistry Recent Labs  Lab 02/23/22 1507 02/23/22 1510 02/23/22 1515 02/23/22 1741 02/24/22 0811  NA 136 137 136 137 134*  K 3.0* 3.1* 3.1* 2.4* 4.7  CL 98 94*  --  100 103  CO2 29  --   --  25 27  GLUCOSE 139* 140*  --  307* 141*  BUN 7* 8  --  6* 7*  CREATININE 0.77 0.70  --  0.59 0.67  CALCIUM 8.6*  --   --  6.7* 8.8*  MG  --   --   --  1.5*  --   PROT 6.0*  --   --  4.4*  --   ALBUMIN 3.3*  --   --  2.5*  --   AST 22  --   --  20  --   ALT 10  --   --  8  --   ALKPHOS 101  --   --  76  --   BILITOT 0.9  --   --  0.9  --   GFRNONAA >60  --   --  >60 >60  ANIONGAP 9  --   --  12 4*    Lipids  Recent Labs  Lab 02/24/22 0811  CHOL 130  TRIG 43  HDL 47  LDLCALC 74  CHOLHDL 2.8    Hematology Recent Labs  Lab 02/23/22 1507 02/23/22 1510 02/23/22 1515 02/23/22 1741 02/24/22 0811  WBC 6.3  --   --  6.2 6.9  RBC 5.06  --   --  4.40 5.00  HGB 15.0   < > 16.3* 12.8 14.8  HCT 45.2   < > 48.0* 40.7 45.7  MCV 89.3  --   --  92.5 91.4  MCH 29.6  --   --  29.1 29.6  MCHC 33.2  --   --  31.4 32.4  RDW 13.2  --   --  13.2 13.3  PLT 353  --   --  319 357   < > = values in this interval not displayed.   Thyroid  Recent Labs  Lab 02/23/22 1741  TSH 1.087    BNPNo results for input(s): "BNP", "PROBNP" in the last 168 hours.  DDimer No results for input(s): "DDIMER" in the last 168 hours.   Radiology    ECHOCARDIOGRAM COMPLETE  Result Date: 02/24/2022    ECHOCARDIOGRAM REPORT   Patient Name:   [name] Date of Exam: 02/24/2022 Medical Rec #:  [id number]         Height:       66.0 in Accession #:    [id number]        Weight:       113.5 lb Date of Birth:  May 24, 1929         BSA:          1.572 m Patient Age:    88 years          BP:           147/92 mmHg Patient Gender: F                 HR:           81 bpm. Exam Location:  Inpatient Procedure: 2D Echo, Strain Analysis, Color Doppler and Cardiac Doppler  Indications:    NSTEMI  History:        Patient has prior history of Echocardiogram examinations, most                 recent 05/21/2018. CAD.  Sonographer:    Merrie Roof RDCS Referring Phys: [id number] PETER M [place] IMPRESSIONS  1. Left ventricular ejection fraction, by estimation, is 45 to 50%. The left ventricle has mildly decreased function. The left ventricle demonstrates regional wall motion abnormalities (basal RCA territory hypokinesis confirmed with regional strain imaging). There is severe asymmetric left ventricular hypertrophy of the basal-septal segment. Left ventricular diastolic parameters are consistent with Grade II diastolic dysfunction (pseudonormalization).  2. Right ventricular systolic function is normal. The right ventricular size is normal. Mildly increased right ventricular wall thickness.  3. Left atrial size was moderately dilated.  4. Right atrial size was moderately dilated.  5. The mitral valve is normal in structure. Trivial mitral valve regurgitation. No evidence of mitral stenosis. The mean mitral valve gradient is 2.0 mmHg with average heart rate of 79 bpm.  6. The aortic valve is calcified. Aortic valve regurgitation is not visualized. Mild aortic valve stenosis. Aortic valve mean gradient measures 10.0 mmHg. Notable decrease in aortic valve excursion, study may understimate stenosis severity. Comparison(s): Prior images reviewed side by side. WMA's are new from 2020. FINDINGS  Left Ventricle: Left ventricular ejection fraction, by estimation, is 45 to 50%. The left ventricle has mildly decreased function. The left ventricle demonstrates regional wall motion abnormalities. The left ventricular internal cavity size was small. There is severe asymmetric left ventricular hypertrophy of the basal-septal segment. Left ventricular diastolic parameters are consistent with Grade II diastolic dysfunction (pseudonormalization).  LV Wall Scoring: The basal inferolateral segment, basal anterolateral  segment, basal inferior segment, and basal inferoseptal segment are hypokinetic. Right Ventricle: The right ventricular size is normal. Mildly increased right ventricular wall thickness. Right ventricular systolic function is normal. Left Atrium: Left atrial size was moderately dilated. Right Atrium: Right atrial size was moderately dilated. Pericardium: There is no evidence of pericardial effusion. Mitral Valve: The mitral valve is normal in structure. Mild to moderate mitral annular calcification. Trivial mitral valve regurgitation. No evidence of mitral valve stenosis. The mean mitral valve gradient is 2.0 mmHg with average heart rate of 79 bpm. Tricuspid Valve: The tricuspid valve is normal in structure. Tricuspid valve regurgitation is not demonstrated. No evidence of tricuspid stenosis. Aortic Valve: The aortic valve is calcified. Aortic valve regurgitation is not visualized. Mild aortic stenosis is present. Aortic valve mean gradient measures 10.0 mmHg. Aortic valve peak gradient measures 16.3 mmHg. Aortic valve area, by VTI measures 1.73 cm. Pulmonic Valve: The pulmonic valve was not well visualized. Pulmonic valve regurgitation is not visualized. Aorta: The aortic root is normal in size and structure. IAS/Shunts: No atrial level shunt detected by color flow Doppler.  LEFT VENTRICLE PLAX 2D LVIDd:         3.70 cm   Diastology LVIDs:         2.60 cm   LV e' medial:    8.38 cm/s LV PW:         1.00 cm   LV E/e' medial:  12.1 LV IVS:        1.00 cm   LV e' lateral:   7.72 cm/s LVOT diam:     2.00 cm   LV E/e' lateral: 13.1 LV SV:         70 LV SV Index:   44 LVOT Area:     3.14 cm  RIGHT VENTRICLE RV Basal diam:  3.90 cm RV S prime:     12.00 cm/s TAPSE (M-mode): 2.2 cm LEFT ATRIUM             Index        RIGHT ATRIUM           Index LA diam:        3.60 cm 2.29 cm/m   RA Area:     23.00 cm LA Vol (A2C):   77.1 ml 49.04 ml/m  RA Volume:   71.50 ml  45.48 ml/m LA Vol (A4C):   62.9 ml 40.01 ml/m LA  Biplane Vol: 71.1 ml 45.23 ml/m  AORTIC VALVE AV Area (Vmax):    1.79 cm AV Area (Vmean):   1.60 cm AV Area (VTI):     1.73 cm AV Vmax:           202.00 cm/s AV Vmean:          149.000 cm/s AV VTI:            0.404 m AV Peak Grad:      16.3 mmHg AV Mean Grad:      10.0 mmHg LVOT Vmax:         115.00 cm/s LVOT Vmean:  76.000 cm/s LVOT VTI:          0.222 m LVOT/AV VTI ratio: 0.55 MITRAL VALVE MV Area (PHT): 5.97 cm     SHUNTS MV Mean grad:  2.0 mmHg     Systemic VTI:  0.22 m MV Decel Time: 127 msec     Systemic Diam: 2.00 cm MV E velocity: 101.00 cm/s MV A velocity: 61.80 cm/s MV E/A ratio:  1.63 Rudean Haskell MD Electronically signed by Rudean Haskell MD Signature Date/Time: 02/24/2022/4:27:44 PM    Final    CARDIAC CATHETERIZATION  Result Date: 02/23/2022   Mid LM to Dist LM lesion is 45% stenosed.   Mid LAD lesion is 30% stenosed.   Dist LAD lesion is 30% stenosed.   Ost Cx to Prox Cx lesion is 30% stenosed.   Ramus lesion is 20% stenosed.   Ost RCA to Prox RCA lesion is 95% stenosed.   The left ventricular systolic function is normal.   LV end diastolic pressure is normal.   The left ventricular ejection fraction is greater than 65% by visual estimate. Single vessel obstructive CAD with severe ostial RCA stenosis with heavy calcification. There is TIMI 3 flow now and patient is pain free Vigorous LV function. Low LVEDP Plan: discussed with Dr Burt Knack. Patient has complex ostial RCA stenosis. It is heavily calcified. Will require atherectomy to treat adequately. I would recommend aggressive medical therapy to stabilize. Loaded with Brilinta. Will resume IV heparin once TR band is off. Start beta blocker, statin, IV amiodarone for Afib. IV hydration.  Plan for staged PCI of the LCx on Monday with atherectomy.    Cardiac Studies    Patient Profile     88 y.o. 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   Assessment & Plan    Principal Problem:   STEMI involving right coronary artery [facility]) Active Problems:   ST elevation myocardial infarction involving right coronary artery (HCC)   Paroxysmal atrial fibrillation ([place])  Inferior STEMI -  - Plan for staged procedure for RCA with atherectomy on 10/16.  - loaded with brilinta, continue DAPT - IV heparin - metop tart 25 mg BID -> uptitrate to 50 mg BID. She has significant anxiety, query whether she would benefit from nonselective BB if metoprolol does not exert anxiolytic effect. - crestor 20 mg daily   Afib - now in SR, on amiodarone. Would continue IV heparin and IV amiodarone through staged procedure.  HTN - BP mildly elevated, resume home amlodipine, uptitrate metop.  For questions or updates, please contact [place] Please consult [email or web address] for contact info under        Signed, Elouise Munroe, MD  02/25/2022, 6:01 AM

## 2022-02-25 NOTE — Progress Notes (Signed)
[place] for Heparin Indication:  complex ostial RCA stenosis - awaiting atherectomy  Allergies  Allergen Reactions   Penicillins Hives, Rash and Other (See Comments)    Has patient had a PCN reaction causing immediate rash, facial/tongue/throat swelling, SOB or lightheadedness with hypotension: Yes Has patient had a PCN reaction causing severe rash involving mucus membranes or skin necrosis: yes Has patient had a PCN reaction that required hospitalization: No Has patient had a PCN reaction occurring within the last 10 years: no If all of the above answers are "NO", then may proceed with Cephalosporin use.  Other Reaction: OTHER REACTION   Valsartan Itching and Other (See Comments)   Atorvastatin Diarrhea   Carvedilol Other (See Comments)    "teeth hurt when I took it"    Ezetimibe Nausea Only   Fluvastatin Sodium Other (See Comments)    "Can't remember"   Magnesium Hydroxide Other (See Comments)    REACTION: triggers HAs   Meloxicam Other (See Comments)    Pt seeing auro's / spots.     Pneumovax [Pneumococcal Polysaccharide Vaccine] Other (See Comments)    Local reaction   Quinapril Hcl Other (See Comments)     "feelings of tiredness"   Simvastatin Diarrhea   Topamax [Topiramate] Itching   Vit D-Vit E-Safflower Oil Other (See Comments)    Headaches    Patient Measurements: Weight: 51.5 kg (113 lb 8.6 oz)  Vital Signs: Temp: 97.9 F (36.6 C) (10/14 2000) Temp Source: Oral (10/14 2000) BP: 154/87 (10/15 0600) Pulse Rate: 75 (10/15 0600)  Labs: Recent Labs    02/23/22 1507 02/23/22 1510 02/23/22 1515 02/23/22 1741 02/23/22 2005 02/24/22 0811 02/24/22 1823 02/25/22 0626  HGB 15.0 16.3*   < > 12.8  --  14.8  --  15.0  HCT 45.2 48.0*   < > 40.7  --  45.7  --  47.5*  PLT 353  --   --  319  --  357  --  319  APTT 34  --   --   --   --   --   --   --   LABPROT 13.8  --   --   --   --   --   --   --   INR 1.1  --   --   --    --   --   --   --   HEPARINUNFRC  --   --   --   --   --  <0.10* 0.10* 0.24*  CREATININE 0.77 0.70  --  0.59  --  0.67  --   --   TROPONINIHS 382*  --   --  740* 1,867*  --   --   --    < > = values in this interval not displayed.     Estimated Creatinine Clearance: 38 mL/min (by C-G formula based on SCr of 0.67 mg/dL).   Medical History: Past Medical History:  Diagnosis Date   CAD (coronary artery disease)    mild nonobstructive disease on cath in 2003   Cancer [facility])    Cardiomyopathy    Probable Takotsubo, severe CP w/ normal cath in 1994. Severe CP in 2003 w/ widespread T wave inversions on ECG. Cath w/ minimal coronary disease but LV-gram showed periapical severe hypokinesis and basilar hyperkinesia (EF 40%). Last echo in 4/09 confirmed full LV functional recovery with EF 60%, no regional wall motion abnormalities, mild to moderate LVH.  Chronic diastolic CHF (congestive heart failure) ([place]) 09/13/2019   CVA (cerebral infarction)    Depression with anxiety 03/29/2011   lost husband 3'09   DVT (deep venous thrombosis) ([place])    after venous ablation, R leg   Fracture 09/10/2007   L2, status post vertebroplasty of L2 performed by IR   GERD (gastroesophageal reflux disease)    Hemorrhoids    Hiatal hernia 03/29/2011   no nerve problems   Hyperlipidemia    Hyperplastic colon polyp 06/2001   Hypertension    Iron deficiency anemia due to chronic blood loss 12/30/2017   OA (osteoarthritis) of knee 03/29/2011   w/ bilateral knee pain-not a problem now   OSA (obstructive sleep apnea) 03/29/2011   no cpap used, not a problem now.   Osteoporosis    Otalgia of both ears    Dr. Simeon Craft   Polycythemia    Stroke [facility]) 03/29/2011   CVA x2 -last 10'12-?TIA(visual problems)   Varicose vein     Medications:  Medications Prior to Admission  Medication Sig Dispense Refill Last Dose   acetaminophen (TYLENOL) 325 MG tablet Take 650 mg by mouth every 6 (six) hours as needed for mild pain.    Past Week   alendronate (FOSAMAX) 70 MG tablet Take 1 tablet (70 mg total) by mouth once a week. Take with a full glass of water on an empty stomach. 12 tablet 3 02/18/2022   amLODipine (NORVASC) 5 MG tablet Take 1 tablet (5 mg total) by mouth daily. 90 tablet 1 02/22/2022   aspirin EC 81 MG tablet Take 81 mg by mouth daily. Swallow whole.   02/22/2022   furosemide (LASIX) 20 MG tablet Take 1 tablet (20 mg total) by mouth every other day. 45 tablet 1 02/22/2022   LORazepam (ATIVAN) 0.5 MG tablet Take 1 tablet (0.5 mg total) by mouth 2 (two) times daily as needed for anxiety. 60 tablet 2 02/22/2022   metoprolol tartrate (LOPRESSOR) 50 MG tablet Take 1 tablet (50 mg total) by mouth 2 (two) times daily. 180 tablet 1 02/22/2022 at 0800   omeprazole (PRILOSEC) 20 MG capsule Take 1 capsule (20 mg total) by mouth daily. 90 capsule 1 02/22/2022   ondansetron (ZOFRAN) 4 MG tablet Take 1 tablet (4 mg total) by mouth every 8 (eight) hours as needed for nausea or vomiting. 12 tablet 0 unknown   [EXPIRED] oxyCODONE (ROXICODONE) 5 MG immediate release tablet Take 0.5-1 tablets (2.5-5 mg total) by mouth every 6 (six) hours as needed for up to 5 days for severe pain. 20 tablet 0 02/22/2022   acetaminophen (TYLENOL) 325 MG tablet Take 2 tablets (650 mg total) by mouth every 8 (eight) hours for 5 days, THEN 2 tablets (650 mg total) every 6 (six) hours as needed. (Patient not taking: Reported on 02/23/2022) 100 tablet 2 Not Taking   doxycycline (VIBRA-TABS) 100 MG tablet Take 1 tablet (100 mg total) by mouth 2 (two) times daily. (Patient not taking: Reported on 02/23/2022) 20 tablet 0 Completed Course    Assessment: 88 y.o. presents as CODE STEMI. Found to have complex ostial RCA stenosis which will require staged PCI on 10/16 or 10/17. Heparin was started 2 hours post TR band removal on 10/13.   Heparin level still low at 0.24 after rate adjustment 10/15. No bleeding or IV issues per night RN.   Goal of Therapy:   Heparin level 0.3-0.7 units/ml Monitor platelets by anticoagulation protocol: Yes   Plan:  No bolus with recent PCI  Increase heparin gtt to 1000 units/hr Will f/u heparin level in 8 hours Daily heparin level and CBC  Thank you for allowing pharmacy to participate in this patient's care.  Reatha Harps, PharmD PGY2 Pharmacy Resident 02/25/2022 6:54 AM Check [email or web address] for unit specific pharmacy number

## 2022-02-25 NOTE — Progress Notes (Signed)
[place] for Heparin Indication:  complex ostial RCA stenosis - awaiting atherectomy  Allergies  Allergen Reactions   Penicillins Hives, Rash and Other (See Comments)    Has patient had a PCN reaction causing immediate rash, facial/tongue/throat swelling, SOB or lightheadedness with hypotension: Yes Has patient had a PCN reaction causing severe rash involving mucus membranes or skin necrosis: yes Has patient had a PCN reaction that required hospitalization: No Has patient had a PCN reaction occurring within the last 10 years: no If all of the above answers are "NO", then may proceed with Cephalosporin use.  Other Reaction: OTHER REACTION   Valsartan Itching and Other (See Comments)   Atorvastatin Diarrhea   Carvedilol Other (See Comments)    "teeth hurt when I took it"    Ezetimibe Nausea Only   Fluvastatin Sodium Other (See Comments)    "Can't remember"   Magnesium Hydroxide Other (See Comments)    REACTION: triggers HAs   Meloxicam Other (See Comments)    Pt seeing auro's / spots.     Pneumovax [Pneumococcal Polysaccharide Vaccine] Other (See Comments)    Local reaction   Quinapril Hcl Other (See Comments)     "feelings of tiredness"   Simvastatin Diarrhea   Topamax [Topiramate] Itching   Vit D-Vit E-Safflower Oil Other (See Comments)    Headaches    Patient Measurements: Weight: 51.5 kg (113 lb 8.6 oz)  Vital Signs: Temp: 98.3 F (36.8 C) (10/15 1502) Temp Source: Oral (10/15 1502) BP: 121/83 (10/15 1500) Pulse Rate: 68 (10/15 1400)  Labs: Recent Labs    02/23/22 1507 02/23/22 1510 02/23/22 1741 02/23/22 1741 02/23/22 2005 02/24/22 0811 02/24/22 1823 02/25/22 0626 02/25/22 1455  HGB 15.0   < > 12.8  --   --  14.8  --  15.0  --   HCT 45.2   < > 40.7  --   --  45.7  --  47.5*  --   PLT 353  --  319  --   --  357  --  319  --   APTT 34  --   --   --   --   --   --   --   --   LABPROT 13.8  --   --   --   --   --   --    --   --   INR 1.1  --   --   --   --   --   --   --   --   HEPARINUNFRC  --   --   --    < >  --  <0.10* 0.10* 0.24* 0.14*  CREATININE 0.77   < > 0.59  --   --  0.67  --  0.56  --   TROPONINIHS 382*  --  740*  --  1,867*  --   --   --   --    < > = values in this interval not displayed.     Estimated Creatinine Clearance: 38 mL/min (by C-G formula based on SCr of 0.56 mg/dL).   Medical History: Past Medical History:  Diagnosis Date   CAD (coronary artery disease)    mild nonobstructive disease on cath in 2003   Cancer [facility])    Cardiomyopathy    Probable Takotsubo, severe CP w/ normal cath in 1994. Severe CP in 2003 w/ widespread T wave inversions on ECG. Cath w/ minimal coronary disease but  LV-gram showed periapical severe hypokinesis and basilar hyperkinesia (EF 40%). Last echo in 4/09 confirmed full LV functional recovery with EF 60%, no regional wall motion abnormalities, mild to moderate LVH.   Chronic diastolic CHF (congestive heart failure) ([place]) 09/13/2019   CVA (cerebral infarction)    Depression with anxiety 03/29/2011   lost husband 3'09   DVT (deep venous thrombosis) ([place])    after venous ablation, R leg   Fracture 09/10/2007   L2, status post vertebroplasty of L2 performed by IR   GERD (gastroesophageal reflux disease)    Hemorrhoids    Hiatal hernia 03/29/2011   no nerve problems   Hyperlipidemia    Hyperplastic colon polyp 06/2001   Hypertension    Iron deficiency anemia due to chronic blood loss 12/30/2017   OA (osteoarthritis) of knee 03/29/2011   w/ bilateral knee pain-not a problem now   OSA (obstructive sleep apnea) 03/29/2011   no cpap used, not a problem now.   Osteoporosis    Otalgia of both ears    Dr. Simeon Craft   Polycythemia    Stroke [facility]) 03/29/2011   CVA x2 -last 10'12-?TIA(visual problems)   Varicose vein     Medications:  Medications Prior to Admission  Medication Sig Dispense Refill Last Dose   acetaminophen (TYLENOL) 325 MG tablet Take  650 mg by mouth every 6 (six) hours as needed for mild pain.   Past Week   alendronate (FOSAMAX) 70 MG tablet Take 1 tablet (70 mg total) by mouth once a week. Take with a full glass of water on an empty stomach. 12 tablet 3 02/18/2022   amLODipine (NORVASC) 5 MG tablet Take 1 tablet (5 mg total) by mouth daily. 90 tablet 1 02/22/2022   aspirin EC 81 MG tablet Take 81 mg by mouth daily. Swallow whole.   02/22/2022   furosemide (LASIX) 20 MG tablet Take 1 tablet (20 mg total) by mouth every other day. 45 tablet 1 02/22/2022   LORazepam (ATIVAN) 0.5 MG tablet Take 1 tablet (0.5 mg total) by mouth 2 (two) times daily as needed for anxiety. 60 tablet 2 02/22/2022   metoprolol tartrate (LOPRESSOR) 50 MG tablet Take 1 tablet (50 mg total) by mouth 2 (two) times daily. 180 tablet 1 02/22/2022 at 0800   omeprazole (PRILOSEC) 20 MG capsule Take 1 capsule (20 mg total) by mouth daily. 90 capsule 1 02/22/2022   ondansetron (ZOFRAN) 4 MG tablet Take 1 tablet (4 mg total) by mouth every 8 (eight) hours as needed for nausea or vomiting. 12 tablet 0 unknown   [EXPIRED] oxyCODONE (ROXICODONE) 5 MG immediate release tablet Take 0.5-1 tablets (2.5-5 mg total) by mouth every 6 (six) hours as needed for up to 5 days for severe pain. 20 tablet 0 02/22/2022   acetaminophen (TYLENOL) 325 MG tablet Take 2 tablets (650 mg total) by mouth every 8 (eight) hours for 5 days, THEN 2 tablets (650 mg total) every 6 (six) hours as needed. (Patient not taking: Reported on 02/23/2022) 100 tablet 2 Not Taking   doxycycline (VIBRA-TABS) 100 MG tablet Take 1 tablet (100 mg total) by mouth 2 (two) times daily. (Patient not taking: Reported on 02/23/2022) 20 tablet 0 Completed Course    Assessment: 88 y.o. presents as CODE STEMI. Found to have complex ostial RCA stenosis which will require staged PCI on 10/16 or 10/17. Heparin was started 2 hours post TR band removal on 10/13.   Heparin is still subtherapeutic. We will increase rate and  check  in AM.   Goal of Therapy:  Heparin level 0.3-0.7 units/ml Monitor platelets by anticoagulation protocol: Yes   Plan:  No bolus with recent PCI Increase heparin gtt to 1150 units/hr Will f/u heparin level in 8 hours Daily heparin level and CBC  Onnie Boer, PharmD, BCIDP, AAHIVP, CPP Infectious Disease Pharmacist 02/25/2022 4:48 PM

## 2022-02-25 NOTE — Progress Notes (Signed)
Rounding Note    Patient Name: [name] Date of Encounter: 02/25/2022  [facility] Cardiologist: Shirlee More, MD   Subjective   No chest pain. Continues to have anxiety.  Inpatient Medications    Scheduled Meds:  amLODipine  5 mg Oral Daily   aspirin EC  81 mg Oral Daily   Chlorhexidine Gluconate Cloth  6 each Topical Daily   melatonin  5 mg Oral QHS   metoprolol tartrate  25 mg Oral BID   rosuvastatin  20 mg Oral Daily   sodium chloride flush  3 mL Intravenous Q12H   ticagrelor  90 mg Oral BID   Continuous Infusions:  sodium chloride     amiodarone 30 mg/hr (02/25/22 0538)   heparin 900 Units/hr (02/25/22 0527)   PRN Meds: sodium chloride, acetaminophen, LORazepam, nitroGLYCERIN, ondansetron (ZOFRAN) IV, oxyCODONE, sodium chloride flush   Vital Signs    Vitals:   02/25/22 0200 02/25/22 0300 02/25/22 0400 02/25/22 0500  BP: 113/62 121/72 121/72 136/65  Pulse: 69 68 69 69  Resp: '17 16 17 19  '$ Temp:      TempSrc:      SpO2: 92% 93% 92% 91%  Weight:        Intake/Output Summary (Last 24 hours) at 02/25/2022 0601 Last data filed at 02/25/2022 0500 Gross per 24 hour  Intake 922.25 ml  Output 1650 ml  Net -727.75 ml      02/23/2022    4:00 PM 12/19/2021    3:33 PM 11/25/2021    1:55 AM  Last 3 Weights  Weight (lbs) 113 lb 8.6 oz 113 lb 8 oz 116 lb 6.5 oz  Weight (kg) 51.5 kg 51.483 kg 52.8 kg      Telemetry    Sinus rhythm - Personally Reviewed  ECG    SR, 1st deg avb, poor R wave progression, resolution of inferior ST elevations and reciprocal change. - Personally Reviewed  Physical Exam   GEN: Anxious, no physical distress Neck: No JVD Cardiac: RRR, no murmurs, rubs, or gallops.  Respiratory: Clear to auscultation bilaterally. GI: Soft, nontender, non-distended  MS: No edema; No deformity. Neuro:  Nonfocal  Psych: anxious.  Labs    High Sensitivity Troponin:   Recent Labs  Lab 02/23/22 1507 02/23/22 1741  02/23/22 2005  TROPONINIHS 382* 740* 1,867*     Chemistry Recent Labs  Lab 02/23/22 1507 02/23/22 1510 02/23/22 1515 02/23/22 1741 02/24/22 0811  NA 136 137 136 137 134*  K 3.0* 3.1* 3.1* 2.4* 4.7  CL 98 94*  --  100 103  CO2 29  --   --  25 27  GLUCOSE 139* 140*  --  307* 141*  BUN 7* 8  --  6* 7*  CREATININE 0.77 0.70  --  0.59 0.67  CALCIUM 8.6*  --   --  6.7* 8.8*  MG  --   --   --  1.5*  --   PROT 6.0*  --   --  4.4*  --   ALBUMIN 3.3*  --   --  2.5*  --   AST 22  --   --  20  --   ALT 10  --   --  8  --   ALKPHOS 101  --   --  76  --   BILITOT 0.9  --   --  0.9  --   GFRNONAA >60  --   --  >60 >60  ANIONGAP 9  --   --  12 4*    Lipids  Recent Labs  Lab 02/24/22 0811  CHOL 130  TRIG 43  HDL 47  LDLCALC 74  CHOLHDL 2.8    Hematology Recent Labs  Lab 02/23/22 1507 02/23/22 1510 02/23/22 1515 02/23/22 1741 02/24/22 0811  WBC 6.3  --   --  6.2 6.9  RBC 5.06  --   --  4.40 5.00  HGB 15.0   < > 16.3* 12.8 14.8  HCT 45.2   < > 48.0* 40.7 45.7  MCV 89.3  --   --  92.5 91.4  MCH 29.6  --   --  29.1 29.6  MCHC 33.2  --   --  31.4 32.4  RDW 13.2  --   --  13.2 13.3  PLT 353  --   --  319 357   < > = values in this interval not displayed.   Thyroid  Recent Labs  Lab 02/23/22 1741  TSH 1.087    BNPNo results for input(s): "BNP", "PROBNP" in the last 168 hours.  DDimer No results for input(s): "DDIMER" in the last 168 hours.   Radiology    ECHOCARDIOGRAM COMPLETE  Result Date: 02/24/2022    ECHOCARDIOGRAM REPORT   Patient Name:   [name] Date of Exam: 02/24/2022 Medical Rec #:  [id number]         Height:       66.0 in Accession #:    [id number]        Weight:       113.5 lb Date of Birth:  [birth year]         BSA:          1.572 m Patient Age:    88 years          BP:           147/92 mmHg Patient Gender: F                 HR:           81 bpm. Exam Location:  Inpatient Procedure: 2D Echo, Strain Analysis, Color Doppler and Cardiac Doppler  Indications:    NSTEMI  History:        Patient has prior history of Echocardiogram examinations, most                 recent 05/21/2018. CAD.  Sonographer:    Merrie Roof RDCS Referring Phys: [id number] PETER M [place] IMPRESSIONS  1. Left ventricular ejection fraction, by estimation, is 45 to 50%. The left ventricle has mildly decreased function. The left ventricle demonstrates regional wall motion abnormalities (basal RCA territory hypokinesis confirmed with regional strain imaging). There is severe asymmetric left ventricular hypertrophy of the basal-septal segment. Left ventricular diastolic parameters are consistent with Grade II diastolic dysfunction (pseudonormalization).  2. Right ventricular systolic function is normal. The right ventricular size is normal. Mildly increased right ventricular wall thickness.  3. Left atrial size was moderately dilated.  4. Right atrial size was moderately dilated.  5. The mitral valve is normal in structure. Trivial mitral valve regurgitation. No evidence of mitral stenosis. The mean mitral valve gradient is 2.0 mmHg with average heart rate of 79 bpm.  6. The aortic valve is calcified. Aortic valve regurgitation is not visualized. Mild aortic valve stenosis. Aortic valve mean gradient measures 10.0 mmHg. Notable decrease in aortic valve excursion, study may understimate stenosis severity. Comparison(s): Prior images reviewed side by side. WMA's are new from 2020. FINDINGS  Left Ventricle: Left ventricular ejection fraction, by estimation, is 45 to 50%. The left ventricle has mildly decreased function. The left ventricle demonstrates regional wall motion abnormalities. The left ventricular internal cavity size was small. There is severe asymmetric left ventricular hypertrophy of the basal-septal segment. Left ventricular diastolic parameters are consistent with Grade II diastolic dysfunction (pseudonormalization).  LV Wall Scoring: The basal inferolateral segment, basal anterolateral  segment, basal inferior segment, and basal inferoseptal segment are hypokinetic. Right Ventricle: The right ventricular size is normal. Mildly increased right ventricular wall thickness. Right ventricular systolic function is normal. Left Atrium: Left atrial size was moderately dilated. Right Atrium: Right atrial size was moderately dilated. Pericardium: There is no evidence of pericardial effusion. Mitral Valve: The mitral valve is normal in structure. Mild to moderate mitral annular calcification. Trivial mitral valve regurgitation. No evidence of mitral valve stenosis. The mean mitral valve gradient is 2.0 mmHg with average heart rate of 79 bpm. Tricuspid Valve: The tricuspid valve is normal in structure. Tricuspid valve regurgitation is not demonstrated. No evidence of tricuspid stenosis. Aortic Valve: The aortic valve is calcified. Aortic valve regurgitation is not visualized. Mild aortic stenosis is present. Aortic valve mean gradient measures 10.0 mmHg. Aortic valve peak gradient measures 16.3 mmHg. Aortic valve area, by VTI measures 1.73 cm. Pulmonic Valve: The pulmonic valve was not well visualized. Pulmonic valve regurgitation is not visualized. Aorta: The aortic root is normal in size and structure. IAS/Shunts: No atrial level shunt detected by color flow Doppler.  LEFT VENTRICLE PLAX 2D LVIDd:         3.70 cm   Diastology LVIDs:         2.60 cm   LV e' medial:    8.38 cm/s LV PW:         1.00 cm   LV E/e' medial:  12.1 LV IVS:        1.00 cm   LV e' lateral:   7.72 cm/s LVOT diam:     2.00 cm   LV E/e' lateral: 13.1 LV SV:         70 LV SV Index:   44 LVOT Area:     3.14 cm  RIGHT VENTRICLE RV Basal diam:  3.90 cm RV S prime:     12.00 cm/s TAPSE (M-mode): 2.2 cm LEFT ATRIUM             Index        RIGHT ATRIUM           Index LA diam:        3.60 cm 2.29 cm/m   RA Area:     23.00 cm LA Vol (A2C):   77.1 ml 49.04 ml/m  RA Volume:   71.50 ml  45.48 ml/m LA Vol (A4C):   62.9 ml 40.01 ml/m LA  Biplane Vol: 71.1 ml 45.23 ml/m  AORTIC VALVE AV Area (Vmax):    1.79 cm AV Area (Vmean):   1.60 cm AV Area (VTI):     1.73 cm AV Vmax:           202.00 cm/s AV Vmean:          149.000 cm/s AV VTI:            0.404 m AV Peak Grad:      16.3 mmHg AV Mean Grad:      10.0 mmHg LVOT Vmax:         115.00 cm/s LVOT Vmean:  76.000 cm/s LVOT VTI:          0.222 m LVOT/AV VTI ratio: 0.55 MITRAL VALVE MV Area (PHT): 5.97 cm     SHUNTS MV Mean grad:  2.0 mmHg     Systemic VTI:  0.22 m MV Decel Time: 127 msec     Systemic Diam: 2.00 cm MV E velocity: 101.00 cm/s MV A velocity: 61.80 cm/s MV E/A ratio:  1.63 Rudean Haskell MD Electronically signed by Rudean Haskell MD Signature Date/Time: 02/24/2022/4:27:44 PM    Final    CARDIAC CATHETERIZATION  Result Date: 02/23/2022   Mid LM to Dist LM lesion is 45% stenosed.   Mid LAD lesion is 30% stenosed.   Dist LAD lesion is 30% stenosed.   Ost Cx to Prox Cx lesion is 30% stenosed.   Ramus lesion is 20% stenosed.   Ost RCA to Prox RCA lesion is 95% stenosed.   The left ventricular systolic function is normal.   LV end diastolic pressure is normal.   The left ventricular ejection fraction is greater than 65% by visual estimate. Single vessel obstructive CAD with severe ostial RCA stenosis with heavy calcification. There is TIMI 3 flow now and patient is pain free Vigorous LV function. Low LVEDP Plan: discussed with Dr Burt Knack. Patient has complex ostial RCA stenosis. It is heavily calcified. Will require atherectomy to treat adequately. I would recommend aggressive medical therapy to stabilize. Loaded with Brilinta. Will resume IV heparin once TR band is off. Start beta blocker, statin, IV amiodarone for Afib. IV hydration.  Plan for staged PCI of the LCx on Monday with atherectomy.    Cardiac Studies    Patient Profile     88 y.o. 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   Assessment & Plan    Principal Problem:   STEMI involving right coronary artery [facility]) Active Problems:   ST elevation myocardial infarction involving right coronary artery (HCC)   Paroxysmal atrial fibrillation ([place])  Inferior STEMI -  - Plan for staged procedure for RCA with atherectomy on 10/16.  - loaded with brilinta, continue DAPT - IV heparin - metop tart 25 mg BID -> uptitrate to 50 mg BID. She has significant anxiety, query whether she would benefit from nonselective BB if metoprolol does not exert anxiolytic effect. - crestor 20 mg daily   Afib - now in SR, on amiodarone. Would continue IV heparin and IV amiodarone through staged procedure.  HTN - BP mildly elevated, resume home amlodipine, uptitrate metop.  For questions or updates, please contact [place] Please consult [email or web address] for contact info under        Signed, Elouise Munroe, MD  02/25/2022, 6:01 AM

## 2022-02-26 ENCOUNTER — Telehealth (HOSPITAL_COMMUNITY): Payer: Self-pay | Admitting: Pharmacy Technician

## 2022-02-26 ENCOUNTER — Encounter (HOSPITAL_COMMUNITY): Payer: Self-pay | Admitting: Cardiology

## 2022-02-26 ENCOUNTER — Other Ambulatory Visit (HOSPITAL_COMMUNITY): Payer: Self-pay

## 2022-02-26 ENCOUNTER — Inpatient Hospital Stay (HOSPITAL_COMMUNITY)
Admission: EM | Disposition: A | Discharge status: Skilled Nursing Facility | Payer: Self-pay | Source: Home / Self Care | Attending: Cardiology

## 2022-02-26 DIAGNOSIS — Z955 Presence of coronary angioplasty implant and graft: Secondary | ICD-10-CM

## 2022-02-26 DIAGNOSIS — E785 Hyperlipidemia, unspecified: Secondary | ICD-10-CM

## 2022-02-26 DIAGNOSIS — I5032 Chronic diastolic (congestive) heart failure: Secondary | ICD-10-CM

## 2022-02-26 DIAGNOSIS — I10 Essential (primary) hypertension: Secondary | ICD-10-CM

## 2022-02-26 DIAGNOSIS — I2510 Atherosclerotic heart disease of native coronary artery without angina pectoris: Secondary | ICD-10-CM | POA: Diagnosis not present

## 2022-02-26 DIAGNOSIS — D500 Iron deficiency anemia secondary to blood loss (chronic): Secondary | ICD-10-CM

## 2022-02-26 DIAGNOSIS — I25110 Atherosclerotic heart disease of native coronary artery with unstable angina pectoris: Secondary | ICD-10-CM

## 2022-02-26 DIAGNOSIS — E871 Hypo-osmolality and hyponatremia: Secondary | ICD-10-CM

## 2022-02-26 DIAGNOSIS — I2111 ST elevation (STEMI) myocardial infarction involving right coronary artery: Secondary | ICD-10-CM | POA: Diagnosis not present

## 2022-02-26 HISTORY — PX: CORONARY STENT INTERVENTION: CATH118234

## 2022-02-26 HISTORY — PX: TEMPORARY PACEMAKER: CATH118268

## 2022-02-26 HISTORY — PX: CORONARY ATHERECTOMY: CATH118238

## 2022-02-26 HISTORY — PX: LEFT HEART CATH AND CORONARY ANGIOGRAPHY: CATH118249

## 2022-02-26 HISTORY — PX: CORONARY ULTRASOUND/IVUS: CATH118244

## 2022-02-26 HISTORY — PX: CORONARY LITHOTRIPSY: CATH118330

## 2022-02-26 LAB — POCT ACTIVATED CLOTTING TIME
Activated Clotting Time: 263 seconds
Activated Clotting Time: 269 seconds
Activated Clotting Time: 275 seconds
Activated Clotting Time: 299 seconds
Activated Clotting Time: 239 seconds
Activated Clotting Time: 287 seconds
Activated Clotting Time: 197 seconds
Activated Clotting Time: 209 seconds

## 2022-02-26 LAB — CBC
HCT: 46.1 % — ABNORMAL HIGH (ref 36.0–46.0)
Hemoglobin: 15 g/dL (ref 12.0–15.0)
MCH: 29.2 pg (ref 26.0–34.0)
MCHC: 32.5 g/dL (ref 30.0–36.0)
MCV: 89.9 fL (ref 80.0–100.0)
Platelets: 349 10*3/uL (ref 150–400)
RBC: 5.13 MIL/uL — ABNORMAL HIGH (ref 3.87–5.11)
RDW: 13.2 % (ref 11.5–15.5)
WBC: 8.4 10*3/uL (ref 4.0–10.5)
nRBC: 0 % (ref 0.0–0.2)

## 2022-02-26 LAB — BASIC METABOLIC PANEL
Anion gap: 8 (ref 5–15)
BUN: 11 mg/dL (ref 8–23)
CO2: 27 mmol/L (ref 22–32)
Calcium: 8.8 mg/dL — ABNORMAL LOW (ref 8.9–10.3)
Chloride: 93 mmol/L — ABNORMAL LOW (ref 98–111)
Creatinine, Ser: 0.61 mg/dL (ref 0.44–1.00)
GFR, Estimated: >60 mL/min (ref >60)
Glucose, Bld: 123 mg/dL — ABNORMAL HIGH (ref 70–99)
Potassium: 5.2 mmol/L — ABNORMAL HIGH (ref 3.5–5.1)
Sodium: 128 mmol/L — ABNORMAL LOW (ref 135–145)

## 2022-02-26 LAB — HEPARIN LEVEL (UNFRACTIONATED): Heparin Unfractionated: 0.17 IU/mL — ABNORMAL LOW (ref 0.30–0.70)

## 2022-02-26 SURGERY — CORONARY ATHERECTOMY
Anesthesia: LOCAL

## 2022-02-26 MED ORDER — SODIUM CHLORIDE 0.9% FLUSH: 3.0000 mL | INTRAVENOUS | Status: DC | PRN

## 2022-02-26 MED ORDER — SODIUM CHLORIDE 0.9 % IV SOLN: INTRAVENOUS | Status: AC

## 2022-02-26 MED ORDER — CALCIUM CARBONATE ANTACID 500 MG PO CHEW
1.0000 | CHEWABLE_TABLET | Freq: Three times a day (TID) | ORAL | Status: DC | PRN
  Administered 2022-02-26 – 2022-02-27 (×5): 200 mg via ORAL
  Filled 2022-02-26 (×5): qty 1

## 2022-02-26 MED ORDER — SODIUM CHLORIDE 0.9% FLUSH: 3.0000 mL | Freq: Two times a day (BID) | INTRAVENOUS | Status: DC

## 2022-02-26 MED ORDER — MIDAZOLAM HCL 2 MG/2ML IJ SOLN
INTRAMUSCULAR | Status: DC | PRN
  Administered 2022-02-26 (×3): .5 mg via INTRAVENOUS

## 2022-02-26 MED ORDER — FENTANYL CITRATE (PF) 100 MCG/2ML IJ SOLN
INTRAMUSCULAR | Status: DC | PRN
  Administered 2022-02-26 (×3): 12.5 ug via INTRAVENOUS

## 2022-02-26 MED ORDER — NITROGLYCERIN IN D5W 200-5 MCG/ML-% IV SOLN
INTRAVENOUS | Status: DC | PRN
  Administered 2022-02-26: 10 ug/min via INTRAVENOUS

## 2022-02-26 MED ORDER — NITROGLYCERIN IN D5W 200-5 MCG/ML-% IV SOLN
INTRAVENOUS | Status: AC
  Filled 2022-02-26: qty 250

## 2022-02-26 MED ORDER — LIDOCAINE HCL (PF) 1 % IJ SOLN
INTRAMUSCULAR | Status: DC | PRN
  Administered 2022-02-26: 15 mL via INTRADERMAL
  Administered 2022-02-26: 2 mL via INTRADERMAL

## 2022-02-26 MED ORDER — LIDOCAINE HCL (PF) 1 % IJ SOLN
INTRAMUSCULAR | Status: AC
  Filled 2022-02-26: qty 30

## 2022-02-26 MED ORDER — SODIUM CHLORIDE 0.9% FLUSH
3.0000 mL | Freq: Two times a day (BID) | INTRAVENOUS | Status: DC
  Administered 2022-02-26 – 2022-02-28 (×3): 3 mL via INTRAVENOUS

## 2022-02-26 MED ORDER — SODIUM CHLORIDE 0.9 % IV SOLN: INTRAVENOUS | Status: DC

## 2022-02-26 MED ORDER — AMIODARONE HCL 200 MG PO TABS
400.0000 mg | ORAL_TABLET | Freq: Every day | ORAL | Status: DC
  Administered 2022-02-26 – 2022-02-28 (×3): 400 mg via ORAL
  Filled 2022-02-26 (×3): qty 2

## 2022-02-26 MED ORDER — SODIUM CHLORIDE 0.9 % IV SOLN: 250.0000 mL | INTRAVENOUS | Status: DC | PRN

## 2022-02-26 MED ORDER — ASPIRIN 81 MG PO CHEW
81.0000 mg | CHEWABLE_TABLET | ORAL | Status: AC
  Administered 2022-02-26: 81 mg via ORAL
  Filled 2022-02-26: qty 1

## 2022-02-26 MED ORDER — HEPARIN (PORCINE) IN NACL 1000-0.9 UT/500ML-% IV SOLN
INTRAVENOUS | Status: AC
  Filled 2022-02-26: qty 1000

## 2022-02-26 MED ORDER — VERAPAMIL HCL 2.5 MG/ML IV SOLN
INTRAVENOUS | Status: AC
  Filled 2022-02-26: qty 2

## 2022-02-26 MED ORDER — IOHEXOL 350 MG/ML SOLN
INTRAVENOUS | Status: DC | PRN
  Administered 2022-02-26: 145 mL via INTRA_ARTERIAL

## 2022-02-26 MED ORDER — CALCIUM CARBONATE ANTACID 500 MG PO CHEW
1.0000 | CHEWABLE_TABLET | ORAL | Status: AC
  Administered 2022-02-26: 200 mg via ORAL
  Filled 2022-02-26 (×2): qty 1

## 2022-02-26 MED ORDER — LORAZEPAM 0.5 MG PO TABS
0.5000 mg | ORAL_TABLET | Freq: Three times a day (TID) | ORAL | Status: DC | PRN
  Administered 2022-02-26 – 2022-02-28 (×4): 0.5 mg via ORAL
  Filled 2022-02-26 (×4): qty 1

## 2022-02-26 MED ORDER — HEPARIN (PORCINE) 25000 UT/250ML-% IV SOLN
1300.0000 [IU]/h | INTRAVENOUS | Status: DC
  Administered 2022-02-26: 1300 [IU]/h via INTRAVENOUS
  Filled 2022-02-26: qty 250

## 2022-02-26 MED ORDER — NITROGLYCERIN 1 MG/10 ML FOR IR/CATH LAB
INTRA_ARTERIAL | Status: AC
  Filled 2022-02-26: qty 10

## 2022-02-26 MED ORDER — HEPARIN SODIUM (PORCINE) 1000 UNIT/ML IJ SOLN
INTRAMUSCULAR | Status: DC | PRN
  Administered 2022-02-26 (×2): 2000 [IU] via INTRAVENOUS
  Administered 2022-02-26: 5000 [IU] via INTRAVENOUS

## 2022-02-26 MED ORDER — NITROGLYCERIN 1 MG/10 ML FOR IR/CATH LAB
INTRA_ARTERIAL | Status: DC | PRN
  Administered 2022-02-26: 200 ug via INTRACORONARY

## 2022-02-26 MED ORDER — MIDAZOLAM HCL 2 MG/2ML IJ SOLN
INTRAMUSCULAR | Status: AC
  Filled 2022-02-26: qty 2

## 2022-02-26 MED ORDER — FENTANYL CITRATE (PF) 100 MCG/2ML IJ SOLN
INTRAMUSCULAR | Status: AC
  Filled 2022-02-26: qty 2

## 2022-02-26 MED ORDER — VERAPAMIL HCL 2.5 MG/ML IV SOLN
INTRAVENOUS | Status: DC | PRN
  Administered 2022-02-26: 10 mL via INTRA_ARTERIAL

## 2022-02-26 MED ORDER — HYDRALAZINE HCL 20 MG/ML IJ SOLN: 10.0000 mg | INTRAMUSCULAR | Status: AC | PRN

## 2022-02-26 MED ORDER — AMIODARONE HCL 200 MG PO TABS: 200.0000 mg | ORAL_TABLET | Freq: Every day | ORAL | Status: DC

## 2022-02-26 MED ORDER — HEPARIN SODIUM (PORCINE) 1000 UNIT/ML IJ SOLN
INTRAMUSCULAR | Status: AC
  Filled 2022-02-26: qty 10

## 2022-02-26 SURGICAL SUPPLY — 39 items
BALL SAPPHIRE NC24 4.0X12 (BALLOONS) ×1
BALLN SAPPHIRE 1.5X12 (BALLOONS) ×1
BALLN SAPPHIRE 2.0X12 (BALLOONS) ×1
BALLN SAPPHIRE 3.0X12 (BALLOONS) ×1
BALLN SCOREFLEX 3.0X10 (BALLOONS) ×1
BALLOON SAPPHIRE 1.5X12 (BALLOONS) IMPLANT
BALLOON SAPPHIRE 2.0X12 (BALLOONS) IMPLANT
BALLOON SAPPHIRE 3.0X12 (BALLOONS) IMPLANT
BALLOON SAPPHIRE NC24 4.0X12 (BALLOONS) IMPLANT
BALLOON SCOREFLEX 3.0X10 (BALLOONS) IMPLANT
CABLE ADAPT PACING TEMP 12FT (ADAPTER) IMPLANT
CATH INFINITI JR4 5F (CATHETERS) IMPLANT
CATH LAUNCHER 6FR JR4 (CATHETERS) IMPLANT
CATH OPTICROSS HD (CATHETERS) IMPLANT
CATH S G BIP PACING (CATHETERS) IMPLANT
CATH SHOCKWAVE 4.0X12 (CATHETERS) IMPLANT
CATHETER SHOCKWAVE 4.0X12 (CATHETERS) ×1
CROWN DIAMONDBACK CLASSIC 1.25 (BURR) IMPLANT
DEVICE RAD COMP TR BAND LRG (VASCULAR PRODUCTS) IMPLANT
ELECT DEFIB PAD ADLT CADENCE (PAD) IMPLANT
GLIDESHEATH SLEND SS 6F .021 (SHEATH) IMPLANT
GUIDEWIRE INQWIRE 1.5J.035X260 (WIRE) IMPLANT
INQWIRE 1.5J .035X260CM (WIRE) ×1
KIT ENCORE 26 ADVANTAGE (KITS) IMPLANT
KIT HEART LEFT (KITS) ×2 IMPLANT
KIT HEMO VALVE WATCHDOG (MISCELLANEOUS) IMPLANT
KIT MICROPUNCTURE NIT STIFF (SHEATH) IMPLANT
LUBRICANT VIPERSLIDE CORONARY (MISCELLANEOUS) IMPLANT
PACK CARDIAC CATHETERIZATION (CUSTOM PROCEDURE TRAY) ×2 IMPLANT
SHEATH PINNACLE 6F 10CM (SHEATH) IMPLANT
SHEATH PROBE COVER 6X72 (BAG) IMPLANT
SLED PULL BACK IVUS (MISCELLANEOUS) IMPLANT
STENT SYNERGY XD 4.0X16 (Permanent Stent) IMPLANT
SYNERGY XD 4.0X16 (Permanent Stent) ×1 IMPLANT
TRANSDUCER W/STOPCOCK (MISCELLANEOUS) ×2 IMPLANT
TUBING CIL FLEX 10 FLL-RA (TUBING) ×2 IMPLANT
VALVE GUARDIAN II ~~LOC~~ HEMO (MISCELLANEOUS) IMPLANT
WIRE RUNTHROUGH .014X180CM (WIRE) IMPLANT
WIRE VIPERWIRE COR FLEX .012 (WIRE) IMPLANT

## 2022-02-26 NOTE — Telephone Encounter (Signed)
Pharmacy Patient Advocate Encounter  Insurance verification completed.    The patient is insured through [employer] Part D   The patient is currently admitted and ran test claims for the following: Eliquis, Xarelto, Farxiga, Jardiance.  Copays and coinsurance results were relayed to Inpatient clinical team.

## 2022-02-26 NOTE — Progress Notes (Addendum)
Rounding Note    Patient Name: [name] Date of Encounter: 02/26/2022  [facility] Cardiologist: Shirlee More, MD   Patient Profile     88 y.o. female with non-obs CAD 2003 Takotsubo CM w/ EF 40% (60% 2009), CVA, HTN, HLD, IDA, OA, OSA not on CPAP, DVT/PE not on A/C, HFpEF, falls, admitted for STEMI, with complex single vessel CAD in RCA likely requiring atherectomy => planned for staged RCA atherectomy-PCI 10/16. Noted to be in A-fib upon arrival, on IV amiodarone.   Assessment & Plan    Principal Problem:   ST elevation myocardial infarction involving right coronary artery (HCC) Active Problems:   Coronary artery disease involving native coronary artery of native heart with unstable angina pectoris (HCC)   Chronic diastolic CHF (congestive heart failure) (HCC)   DVT (deep venous thrombosis) (HCC)   PE (pulmonary thromboembolism) (HCC)   Paroxysmal atrial fibrillation (HCC)   Hyperlipidemia with target LDL less than 70   Essential hypertension   OSA (obstructive sleep apnea)   Iron deficiency anemia due to chronic blood loss   Dilutional hyponatremia  Principal Problem:   ST elevation myocardial infarction involving right coronary artery (HCC)   Coronary artery disease involving native coronary artery of native heart with unstable angina pectoris ([place]) Has been stable over the weekend on IV heparin, aspirin 81 mg and Brilinta 90 mg twice daily Continued on modest dose rosuvastatin 20 mg and Lopressor 50 mg twice daily Plan for today RCA PCI with atherectomy and DES-scheduled for Dr. Saunders Revel Loaded with 180 mg Brilinta on 02/23/2022 -> however, may need to convert to clopidogrel given concerns for A-fib and need for DOAC.    Chronic diastolic CHF (congestive heart failure) (HCC) -> GRII DD with severe baseline septal LVH on echo Appears to be euvolemic. ->  Hold off on diuretic for now. On stable dose Lopressor with home amlodipine added, once we have  determined renal status post cath, could consider low-dose ARB instead of amlodipine.    DVT (deep venous thrombosis) (HCC) / PE (pulmonary thromboembolism) (HCC)   Paroxysmal atrial fibrillation (HCC) Maintaining sinus rhythm on amiodarone infusion, anticipate converting to oral post PCI (continue on IV to maintain sinus rhythm during the PCI) => probably will do 400 mg daily for 5 days and then reduce to 200 mg daily. This patients CHA2DS2-VASc Score and unadjusted Ischemic Stroke Rate (% per year) is equal to 11.2 % stroke rate/year from a score of 7 -> need to determine appropriate treatment course: Above score calculated as 1 point each if present [CHF, HTN, DM, Vascular=MI/PAD/Aortic Plaque, Age if 65-74, or Female] Above score calculated as 2 points each if present [Age > 75, or Stroke/TIA/TE] Will conver to [facility] with Plavix monotherapy & add DOAC (Eliquis 2.5 mg BID - low wgt & age)    Hyperlipidemia with target LDL less than 70 -> currently on Crestor 20 mg daily. Lab Results  Component Value Date   CHOL 130 02/24/2022   HDL 47 02/24/2022   LDLCALC 74 02/24/2022   TRIG 43 02/24/2022      Essential hypertension -> somewhat labile blood pressures.  But probably will be much more aggressive. Continue metoprolol titrate 50 mg twice daily,  Amlodipine 5 mg daily restarted yesterday.    OSA (obstructive sleep apnea) -> we did determine if she is using CPAP at home.    Iron deficiency anemia due to chronic blood loss = would be very leery of considering triple therapy  alone dual therapy with CAD-PCI and A-fib. =>     Dilutional hyponatremia -- Resolved   DISPO: Plan for RCA PCI with Atherectomy; Will need to assess stability for d/c tomorrow post-cath, ? PT/OT evaluation fro d/c planning.   ===================================================================  Subjective   Very anxious with GERD Sx pre-cath  Seen just after cath - a bit sedated (will re-assess in  PM).  Inpatient Medications    Scheduled Meds:  amLODipine  5 mg Oral Daily   aspirin EC  81 mg Oral Daily   Chlorhexidine Gluconate Cloth  6 each Topical Daily   melatonin  5 mg Oral QHS   metoprolol tartrate  50 mg Oral BID   rosuvastatin  20 mg Oral Daily   sodium chloride flush  3 mL Intravenous Q12H   ticagrelor  90 mg Oral BID   Continuous Infusions:  sodium chloride     amiodarone 30 mg/hr (02/25/22 1935)   heparin 1,150 Units/hr (02/25/22 1935)   PRN Meds: sodium chloride, acetaminophen, LORazepam, nitroGLYCERIN, ondansetron (ZOFRAN) IV, oxyCODONE, sodium chloride flush   Vital Signs    Vitals:   02/25/22 1800 02/25/22 1841 02/25/22 1935 02/25/22 2315  BP: (!) 150/82 (!) 117/100 (!) 116/93 (!) 144/92  Pulse: 72 72 71   Resp: '17 18 19 18  '$ Temp:  97.8 F (36.6 C) 97.7 F (36.5 C) 97.7 F (36.5 C)  TempSrc:  Oral Oral Oral  SpO2: 94% 94% 98%   Weight:        Intake/Output Summary (Last 24 hours) at 02/26/2022 0147 Last data filed at 02/25/2022 1935 Gross per 24 hour  Intake 861.15 ml  Output 450 ml  Net 411.15 ml      02/23/2022    4:00 PM 12/19/2021    3:33 PM 11/25/2021    1:55 AM  Last 3 Weights  Weight (lbs) 113 lb 8.6 oz 113 lb 8 oz 116 lb 6.5 oz  Weight (kg) 51.5 kg 51.483 kg 52.8 kg      Telemetry    SR - Personally Reviewed  ECG    SR , 70 bpm. 1st Deg AVB. [facility], age indeterminate; NS ST-T changes - Personally Reviewed; Stable   Cardiac Studies   CATH 02/23/2022: Ost-Prox RCA heavily calcified 95% (TIMI III flow), mid-dist LM 45%, mid LAD 30%, dist LAD 30%.  Ost-prox LCx 30%, RI 20%.  Normal LVEDP.  EF65% with no RWMA.   Staged Atherectomy DES PCI today - full note pending (films personally reviewed)  TTE 02/24/2022: EF 45 to 50%.  Mildly decreased function.  Basal RCA territory HK.  Severe asymmetric LVH-basal septal.  GR 2 DD-moderate LA dilation.  Trivial MR.  Mild calcific AS (mean AVG 10 mmHg, decreased valve excursion, may  underestimate severity).  Normal RV size and function.  Mildly increased RV thickness.  Moderate RA dilation. => Compared to last echo, WMA new  Physical Exam   GEN: No acute distress.    Neck: No JVD Cardiac: RRR, 1/6 SEM @ RUSB, NO rubs, or gallops.  Respiratory: Clear to auscultation bilaterally. GI: Soft, nontender, non-distended  MS: No edema; No deformity. Neuro:  Nonfocal  Psych: Normal affect; anxious   Labs    High Sensitivity Troponin:   Recent Labs  Lab 02/23/22 1507 02/23/22 1741 02/23/22 2005  TROPONINIHS 382* 740* 1,867*     Chemistry Recent Labs  Lab 02/23/22 1507 02/23/22 1510 02/23/22 1741 02/24/22 0811 02/25/22 0626 02/26/22 0018  NA 136   < > 137 134*  133* 128*  K 3.0*   < > 2.4* 4.7 4.2 5.2*  CL 98   < > 100 103 102 93*  CO2 29  --  '25 27 22 27  '$ GLUCOSE 139*   < > 307* 141* 117* 123*  BUN 7*   < > 6* 7* 5* 11  CREATININE 0.77   < > 0.59 0.67 0.56 0.61  CALCIUM 8.6*  --  6.7* 8.8* 8.7* 8.8*  MG  --   --  1.5*  --  1.9  --   PROT 6.0*  --  4.4*  --   --   --   ALBUMIN 3.3*  --  2.5*  --   --   --   AST 22  --  20  --   --   --   ALT 10  --  8  --   --   --   ALKPHOS 101  --  76  --   --   --   BILITOT 0.9  --  0.9  --   --   --   GFRNONAA >60  --  >60 >60 >60 >60  ANIONGAP 9  --  12 4* 9 8   < > = values in this interval not displayed.    Lipids  Recent Labs  Lab 02/24/22 0811  CHOL 130  TRIG 43  HDL 47  LDLCALC 74  CHOLHDL 2.8    Hematology Recent Labs  Lab 02/24/22 0811 02/25/22 0626 02/26/22 0018  WBC 6.9 9.7 8.4  RBC 5.00 5.19* 5.13*  HGB 14.8 15.0 15.0  HCT 45.7 47.5* 46.1*  MCV 91.4 91.5 89.9  MCH 29.6 28.9 29.2  MCHC 32.4 31.6 32.5  RDW 13.3 13.3 13.2  PLT 357 319 349   Thyroid  Recent Labs  Lab 02/23/22 1741  TSH 1.087    BNPNo results for input(s): "BNP", "PROBNP" in the last 168 hours.  DDimer No results for input(s): "DDIMER" in the last 168 hours.   Radiology    No new studies    For questions  or updates, please contact [place] Please consult [email or web address] for contact info under        Signed, Glenetta Hew, MD  02/26/2022, 1:47 AM

## 2022-02-26 NOTE — Progress Notes (Signed)
ANTICOAGULATION CONSULT NOTE - Follow Up Consult  Pharmacy Consult for heparin Indication:  STEMI now awaiting atherectomy  Labs: Recent Labs    02/23/22 1507 02/23/22 1510 02/23/22 1741 02/23/22 2005 02/24/22 0811 02/24/22 1823 02/25/22 0626 02/25/22 1455 02/26/22 0018  HGB 15.0   < > 12.8  --  14.8  --  15.0  --  15.0  HCT 45.2   < > 40.7  --  45.7  --  47.5*  --  46.1*  PLT 353  --  319  --  357  --  319  --  349  APTT 34  --   --   --   --   --   --   --   --   LABPROT 13.8  --   --   --   --   --   --   --   --   INR 1.1  --   --   --   --   --   --   --   --   HEPARINUNFRC  --   --   --   --  <0.10*   < > 0.24* 0.14* 0.17*  CREATININE 0.77   < > 0.59  --  0.67  --  0.56  --  0.61  TROPONINIHS 382*  --  740* 1,867*  --   --   --   --   --    < > = values in this interval not displayed.    Assessment: 88yo female subtherapeutic on heparin after rate change; no infusion issues or signs of bleeding per RN.  Goal of Therapy:  Heparin level 0.3-0.7 units/ml   Plan:  Will increase heparin infusion by 3 units/kg/hr to 1300 units/hr and check level in 8 hours.    [name], PharmD, BCPS  02/26/2022,1:45 AM

## 2022-02-26 NOTE — TOC Benefit Eligibility Note (Signed)
Patient Teacher, English as a foreign language completed.    The patient is currently admitted and upon discharge could be taking Eliquis 5 mg.  The current 30 day co-pay is $47.00.   The patient is currently admitted and upon discharge could be taking Xarelto 20 mg.  The current 30 day co-pay is $47.00.   The patient is currently admitted and upon discharge could be taking Farxiga 10 mg.  The current 30 day co-pay is $47.00.   The patient is currently admitted and upon discharge could be taking Jardiance 10 mg.  The current 30 day co-pay is $47.00.   The patient is insured through [place], [place] Patient Advocate Specialist [place] Patient Advocate Team Direct Number: [phone]  Fax: [phone]

## 2022-02-26 NOTE — Interval H&P Note (Signed)
History and Physical Interval Note:  02/26/2022 7:09 AM  [name]  has presented today for surgery, with the diagnosis of STEMI.  The various methods of treatment have been discussed with the patient and family. After consideration of risks, benefits and other options for treatment, the patient has consented to  Procedure(s): CORONARY ATHERECTOMY (N/A) as a surgical intervention.  The patient's history has been reviewed, patient examined, no change in status, stable for surgery.  I have reviewed the patient's chart and labs.  Questions were answered to the patient's satisfaction.    Cath Lab Visit (complete for each Cath Lab visit)  Clinical Evaluation Leading to the Procedure:   ACS: Yes.    Non-ACS:  N/A  [name]

## 2022-02-26 NOTE — Progress Notes (Addendum)
[place] for Heparin Indication:  complex ostial RCA stenosis - awaiting atherectomy  Allergies  Allergen Reactions   Penicillins Hives, Rash and Other (See Comments)    Has patient had a PCN reaction causing immediate rash, facial/tongue/throat swelling, SOB or lightheadedness with hypotension: Yes Has patient had a PCN reaction causing severe rash involving mucus membranes or skin necrosis: yes Has patient had a PCN reaction that required hospitalization: No Has patient had a PCN reaction occurring within the last 10 years: no If all of the above answers are "NO", then may proceed with Cephalosporin use.  Other Reaction: OTHER REACTION   Valsartan Itching and Other (See Comments)   Atorvastatin Diarrhea   Carvedilol Other (See Comments)    "teeth hurt when I took it"    Ezetimibe Nausea Only   Fluvastatin Sodium Other (See Comments)    "Can't remember"   Magnesium Hydroxide Other (See Comments)    REACTION: triggers HAs   Meloxicam Other (See Comments)    Pt seeing auro's / spots.     Pneumovax [Pneumococcal Polysaccharide Vaccine] Other (See Comments)    Local reaction   Quinapril Hcl Other (See Comments)     "feelings of tiredness"   Simvastatin Diarrhea   Topamax [Topiramate] Itching   Vit D-Vit E-Safflower Oil Other (See Comments)    Headaches    Patient Measurements: Weight: 57.4 kg (126 lb 8.7 oz)  Vital Signs: Temp: 97.6 F (36.4 C) (10/16 0348) Temp Source: Oral (10/16 0348) BP: 135/88 (10/16 1250) Pulse Rate: 72 (10/16 1250)  Labs: Recent Labs    02/23/22 1507 02/23/22 1510 02/23/22 1741 02/23/22 2005 02/24/22 0811 02/24/22 1823 02/25/22 0626 02/25/22 1455 02/26/22 0018  HGB 15.0   < > 12.8  --  14.8  --  15.0  --  15.0  HCT 45.2   < > 40.7  --  45.7  --  47.5*  --  46.1*  PLT 353  --  319  --  357  --  319  --  349  APTT 34  --   --   --   --   --   --   --   --   LABPROT 13.8  --   --   --   --   --   --    --   --   INR 1.1  --   --   --   --   --   --   --   --   HEPARINUNFRC  --   --   --   --  <0.10*   < > 0.24* 0.14* 0.17*  CREATININE 0.77   < > 0.59  --  0.67  --  0.56  --  0.61  TROPONINIHS 382*  --  740* 1,867*  --   --   --   --   --    < > = values in this interval not displayed.     Estimated Creatinine Clearance: 42.4 mL/min (by C-G formula based on SCr of 0.61 mg/dL).   Medical History: Past Medical History:  Diagnosis Date   CAD (coronary artery disease)    mild nonobstructive disease on cath in 2003   Cancer [facility])    Cardiomyopathy    Probable Takotsubo, severe CP w/ normal cath in 1994. Severe CP in 2003 w/ widespread T wave inversions on ECG. Cath w/ minimal coronary disease but LV-gram showed periapical severe hypokinesis and basilar hyperkinesia (  EF 40%). Last echo in 4/09 confirmed full LV functional recovery with EF 60%, no regional wall motion abnormalities, mild to moderate LVH.   Chronic diastolic CHF (congestive heart failure) ([place]) 09/13/2019   CVA (cerebral infarction)    Depression with anxiety 03/29/2011   lost husband 3'09   DVT (deep venous thrombosis) ([place])    after venous ablation, R leg   Fracture 09/10/2007   L2, status post vertebroplasty of L2 performed by IR   GERD (gastroesophageal reflux disease)    Hemorrhoids    Hiatal hernia 03/29/2011   no nerve problems   Hyperlipidemia    Hyperplastic colon polyp 06/2001   Hypertension    Iron deficiency anemia due to chronic blood loss 12/30/2017   OA (osteoarthritis) of knee 03/29/2011   w/ bilateral knee pain-not a problem now   OSA (obstructive sleep apnea) 03/29/2011   no cpap used, not a problem now.   Osteoporosis    Otalgia of both ears    Dr. Simeon Craft   Polycythemia    Stroke [facility]) 03/29/2011   CVA x2 -last 10'12-?TIA(visual problems)   Varicose vein     Medications:  Medications Prior to Admission  Medication Sig Dispense Refill Last Dose   acetaminophen (TYLENOL) 325 MG tablet  Take 650 mg by mouth every 6 (six) hours as needed for mild pain.   Past Week   alendronate (FOSAMAX) 70 MG tablet Take 1 tablet (70 mg total) by mouth once a week. Take with a full glass of water on an empty stomach. 12 tablet 3 02/18/2022   amLODipine (NORVASC) 5 MG tablet Take 1 tablet (5 mg total) by mouth daily. 90 tablet 1 02/22/2022   aspirin EC 81 MG tablet Take 81 mg by mouth daily. Swallow whole.   02/22/2022   furosemide (LASIX) 20 MG tablet Take 1 tablet (20 mg total) by mouth every other day. 45 tablet 1 02/22/2022   LORazepam (ATIVAN) 0.5 MG tablet Take 1 tablet (0.5 mg total) by mouth 2 (two) times daily as needed for anxiety. 60 tablet 2 02/22/2022   metoprolol tartrate (LOPRESSOR) 50 MG tablet Take 1 tablet (50 mg total) by mouth 2 (two) times daily. 180 tablet 1 02/22/2022 at 0800   omeprazole (PRILOSEC) 20 MG capsule Take 1 capsule (20 mg total) by mouth daily. 90 capsule 1 02/22/2022   ondansetron (ZOFRAN) 4 MG tablet Take 1 tablet (4 mg total) by mouth every 8 (eight) hours as needed for nausea or vomiting. 12 tablet 0 unknown   [EXPIRED] oxyCODONE (ROXICODONE) 5 MG immediate release tablet Take 0.5-1 tablets (2.5-5 mg total) by mouth every 6 (six) hours as needed for up to 5 days for severe pain. 20 tablet 0 02/22/2022   acetaminophen (TYLENOL) 325 MG tablet Take 2 tablets (650 mg total) by mouth every 8 (eight) hours for 5 days, THEN 2 tablets (650 mg total) every 6 (six) hours as needed. (Patient not taking: Reported on 02/23/2022) 100 tablet 2 Not Taking   doxycycline (VIBRA-TABS) 100 MG tablet Take 1 tablet (100 mg total) by mouth 2 (two) times daily. (Patient not taking: Reported on 02/23/2022) 20 tablet 0 Completed Course    Assessment: 88 y.o. presents as CODE STEMI. Found to have complex ostial RCA stenosis s/p staged PCI today. Plans are to restart heparin 2 hours after TR band removed (sheath removed ~ 12pm) -last heparin rate was 1300 units/hr -plans noted to start  apixaban 10/17 and change brilinta to plavix    Goal  of Therapy:  Heparin level 0.3-0.7 units/ml Monitor platelets by anticoagulation protocol: Yes   Plan:  -resume heparin 1300 units/hr 6 hours post sheath pull (~ 6:30pm) -Heparin level in 8 hours and daily wth CBC daily  Hildred Laser, PharmD Clinical Pharmacist **Pharmacist phone directory can now be found on [email or web address] ([email or web address]).  Listed under [place].

## 2022-02-26 NOTE — Progress Notes (Signed)
Mobility Specialist Progress Note    02/26/22 1530  Mobility  Activity Ambulated with assistance in hallway  Level of Assistance Contact guard assist, steadying assist  Assistive Device Front wheel walker  Distance Ambulated (ft) 100 ft  Activity Response Tolerated well  Mobility Referral Yes  $Mobility charge 1 Mobility   Pre-Mobility: 71 HR, 145/73 BP Post-Mobility: 74 HR, 138/76 BP  Pt received in bed and agreeable. No complaints on walk. Encouraged pt to rest her arm on RW. Returned to chair with call bell in reach.    Hildred Alamin Mobility Specialist  Secure Chat Only

## 2022-02-26 NOTE — Progress Notes (Signed)
Site area: right groin a 6 french venous sheath was removed  Site Prior to Removal:  Level 0  Pressure Applied For 20 MINUTES    Bedrest Beginning at 1210p    Manual:   Yes.    Patient Status During Pull:  stable  Post Pull Groin Site:  Level 0  Post Pull Instructions Given:  Yes.    Post Pull Pulses Present:  Yes.    Dressing Applied:  Yes.    Comments:

## 2022-02-27 ENCOUNTER — Encounter (HOSPITAL_COMMUNITY): Payer: Self-pay | Admitting: Internal Medicine

## 2022-02-27 DIAGNOSIS — I25110 Atherosclerotic heart disease of native coronary artery with unstable angina pectoris: Secondary | ICD-10-CM | POA: Diagnosis not present

## 2022-02-27 DIAGNOSIS — E871 Hypo-osmolality and hyponatremia: Secondary | ICD-10-CM | POA: Diagnosis not present

## 2022-02-27 DIAGNOSIS — Z955 Presence of coronary angioplasty implant and graft: Secondary | ICD-10-CM | POA: Diagnosis not present

## 2022-02-27 DIAGNOSIS — I5032 Chronic diastolic (congestive) heart failure: Secondary | ICD-10-CM | POA: Diagnosis not present

## 2022-02-27 LAB — BASIC METABOLIC PANEL
Anion gap: 12 (ref 5–15)
BUN: 9 mg/dL (ref 8–23)
CO2: 25 mmol/L (ref 22–32)
Calcium: 9.7 mg/dL (ref 8.9–10.3)
Chloride: 94 mmol/L — ABNORMAL LOW (ref 98–111)
Creatinine, Ser: 0.69 mg/dL (ref 0.44–1.00)
GFR, Estimated: >60 mL/min (ref >60)
Glucose, Bld: 110 mg/dL — ABNORMAL HIGH (ref 70–99)
Potassium: 4 mmol/L (ref 3.5–5.1)
Sodium: 131 mmol/L — ABNORMAL LOW (ref 135–145)

## 2022-02-27 LAB — CBC
HCT: 44.6 % (ref 36.0–46.0)
Hemoglobin: 14.3 g/dL (ref 12.0–15.0)
MCH: 28.9 pg (ref 26.0–34.0)
MCHC: 32.1 g/dL (ref 30.0–36.0)
MCV: 90.1 fL (ref 80.0–100.0)
Platelets: 397 10*3/uL (ref 150–400)
RBC: 4.95 MIL/uL (ref 3.87–5.11)
RDW: 13.2 % (ref 11.5–15.5)
WBC: 7.9 10*3/uL (ref 4.0–10.5)
nRBC: 0 % (ref 0.0–0.2)

## 2022-02-27 LAB — URINALYSIS, COMPLETE (UACMP) WITH MICROSCOPIC
Bacteria, UA: NONE SEEN
Bilirubin Urine: NEGATIVE
Glucose, UA: NEGATIVE mg/dL
Hgb urine dipstick: NEGATIVE
Ketones, ur: NEGATIVE mg/dL
Leukocytes,Ua: NEGATIVE
Nitrite: NEGATIVE
Protein, ur: NEGATIVE mg/dL
Specific Gravity, Urine: 1.004 — ABNORMAL LOW (ref 1.005–1.030)
pH: 7 (ref 5.0–8.0)

## 2022-02-27 LAB — HEPARIN LEVEL (UNFRACTIONATED): Heparin Unfractionated: 0.42 IU/mL (ref 0.30–0.70)

## 2022-02-27 MED ORDER — CLOPIDOGREL BISULFATE 300 MG PO TABS
300.0000 mg | ORAL_TABLET | Freq: Once | ORAL | Status: AC
  Administered 2022-02-27: 300 mg via ORAL
  Filled 2022-02-27: qty 1

## 2022-02-27 MED ORDER — APIXABAN 2.5 MG PO TABS
2.5000 mg | ORAL_TABLET | Freq: Two times a day (BID) | ORAL | Status: DC
  Administered 2022-02-27 – 2022-02-28 (×3): 2.5 mg via ORAL
  Filled 2022-02-27 (×4): qty 1

## 2022-02-27 MED ORDER — CLOPIDOGREL BISULFATE 75 MG PO TABS
75.0000 mg | ORAL_TABLET | Freq: Every day | ORAL | Status: DC
  Administered 2022-02-28: 75 mg via ORAL
  Filled 2022-02-27 (×2): qty 1

## 2022-02-27 NOTE — Discharge Instructions (Signed)

## 2022-02-27 NOTE — Progress Notes (Signed)
Rounding Note    Patient Name: [name] Date of Encounter: 02/27/2022  [facility] Cardiologist: Shirlee More, MD   Patient Profile     88 y.o. female with non-obs CAD 2003 Takotsubo CM w/ EF 40% (60% 2009), CVA, HTN, HLD, IDA, OA, OSA not on CPAP, DVT/PE not on A/C, HFpEF, falls, admitted for STEMI, with complex single vessel CAD in RCA likely requiring atherectomy => planned for staged RCA atherectomy-PCI 10/16. Noted to be in A-fib upon arrival, on IV amiodarone. Staged Atherectomy DES PCI RCA 02/26/22   Assessment & Plan    Principal Problem:   ST elevation myocardial infarction involving right coronary artery [facility]) Active Problems:   Coronary artery disease involving native coronary artery of native heart with unstable angina pectoris (HCC)   Chronic diastolic CHF (congestive heart failure) (HCC)   DVT (deep venous thrombosis) (HCC)   PE (pulmonary thromboembolism) (HCC)   Paroxysmal atrial fibrillation (HCC)   Hyperlipidemia with target LDL less than 70   Essential hypertension   OSA (obstructive sleep apnea)   Iron deficiency anemia due to chronic blood loss   Dilutional hyponatremia   Presence of drug-eluting stent in right coronary artery  Principal Problem:   ST elevation myocardial infarction involving right coronary artery ([place])   Coronary artery disease involving native coronary artery of native heart with unstable angina pectoris ([place]) Has been stable over the weekend on IV heparin, aspirin 81 mg and Brilinta 90 mg twice daily Continued on modest dose rosuvastatin 20 mg and Lopressor 50 mg twice daily RCA PCI with atherectomy and DES-performed by Dr. Saunders Revel on 10/16 -> excellent result. Initially loaded with 180 mg Brilinta on 02/23/2022 -> with need for DOAC, have reloaded on Plavix today and discontinue aspirin.    Chronic diastolic CHF (congestive heart failure) (HCC) -> GRII DD with severe baseline septal LVH on echo Appears to be  euvolemic. ->  Hold off on diuretic for now.  Appears to be auto diuresing. On stable dose Lopressor with home amlodipine added, once we have determined renal status post cath, could consider low-dose ARB instead of amlodipine. => Consider converting from amlodipine (has a listed allergy for ARB's)    DVT (deep venous thrombosis) (HCC) / PE (pulmonary thromboembolism) (HCC)   Paroxysmal atrial fibrillation (HCC) -> currently on amiodarone-convert to p.o.  Maintaining sinus rhythm. Converted from IV to p.o. amiodarone: Plan 400 mg daily for  (beginning today 10/17) 5days and then reduce to 200 mg daily. This patients CHA2DS2-VASc Score and unadjusted Ischemic Stroke Rate (% per year) is equal to 11.2 % stroke rate/year from a score of 7 -> need to determine appropriate treatment course: Above score calculated as 1 point each if present [CHF, HTN, DM, Vascular=MI/PAD/Aortic Plaque, Age if 65-74, or Female] Above score calculated as 2 points each if present [Age > 75, or Stroke/TIA/TE] Converted over to Plavix monotherapy yesterday with 300 mg load-today, now on 75 mg daily.   Also*DOAC (Eliquis 2.5 mg BID - low wgt & age) today    Hyperlipidemia with target LDL less than 70 -> currently Crestor 20 mg daily Lab Results  Component Value Date   CHOL 130 02/24/2022   HDL 47 02/24/2022   LDLCALC 74 02/24/2022   TRIG 43 02/24/2022      Essential hypertension -> blood pressure seems to be a little more stable today.  But probably will be much more aggressive. Continue metoprolol titrate 50 mg twice daily,  Tolerating amlodipine 5  mg daily restarted on 02/25/2022.    OSA (obstructive sleep apnea) -> we did determine if she is using CPAP at home.    Iron deficiency anemia due to chronic blood loss = stable blood count now.   Dilutional hyponatremia --resolved, stable.  Hypokalemia also resolved.   DISPO: . Social workers were involved yesterday based on concerns for discharge planning.  Has been  agreeable to skilled nursing facility.  Daughter was concerned about falls.  Dr. Saunders Revel saw her yesterday and there was concern about her use of Ativan and confirming how much time or other OTC medications he is taking.  I do think that an inpatient setting would probably be a good place for her to start speaking somewhat police what medicine she is getting.  FL2 signed  ===================================================================  Subjective   Per RN, she has been intermittently anxious and asking for Ativan and Tums.  She keeps commenting about heartburn, but is not having her angina symptoms.  She was sleeping soundly when I went to see her.  Was seen yesterday post PCI by Dr. Saunders Revel and there is definitely concern from the daughter's side about discharge planning.  There was discussion about potential SNF.  DC planning team is on board and it does look like they are agreeable to SNF.  Inpatient Medications    Scheduled Meds:  amiodarone  400 mg Oral Daily   Followed by   Derrill Memo ON 03/03/2022] amiodarone  200 mg Oral Daily   amLODipine  5 mg Oral Daily   apixaban  2.5 mg Oral BID   aspirin EC  81 mg Oral Daily   Chlorhexidine Gluconate Cloth  6 each Topical Daily   [START ON 02/28/2022] clopidogrel  75 mg Oral Daily   melatonin  5 mg Oral QHS   metoprolol tartrate  50 mg Oral BID   rosuvastatin  20 mg Oral Daily   sodium chloride flush  3 mL Intravenous Q12H   sodium chloride flush  3 mL Intravenous Q12H   Continuous Infusions:  sodium chloride     sodium chloride     PRN Meds: sodium chloride, sodium chloride, acetaminophen, calcium carbonate, LORazepam, nitroGLYCERIN, ondansetron (ZOFRAN) IV, oxyCODONE, sodium chloride flush, sodium chloride flush   Vital Signs    Vitals:   02/27/22 0700 02/27/22 0902 02/27/22 1157 02/27/22 1550  BP: 138/75 (!) 149/79 (!) 144/75 130/67  Pulse: 64 82    Resp: '11  18 18  '$ Temp: 97.7 F (36.5 C)  97.7 F (36.5 C) 97.8 F (36.6 C)   TempSrc: Oral  Oral Oral  SpO2: 96%  97% 97%  Weight:        Intake/Output Summary (Last 24 hours) at 02/27/2022 1554 Last data filed at 02/27/2022 0550 Gross per 24 hour  Intake 218.03 ml  Output 450 ml  Net -231.97 ml      02/26/2022    4:19 AM 02/23/2022    4:00 PM 12/19/2021    3:33 PM  Last 3 Weights  Weight (lbs) 126 lb 8.7 oz 113 lb 8.6 oz 113 lb 8 oz  Weight (kg) 57.4 kg 51.5 kg 51.483 kg      Telemetry    SR - Personally Reviewed  ECG    SR , 66 bpm. 1st Deg AVB. [place]- au ; NS ST-T changes - Personally Reviewed; stable   Cardiac Studies   CATH 02/23/2022: Ost-Prox RCA heavily calcified 95% (TIMI III flow), mid-dist LM 45%, mid LAD 30%, dist LAD 30%.  Ost-prox  LCx 30%, RI 20%.  Normal LVEDP.  EF65% with no RWMA.   Staged Atherectomy-lithotripsy DES PCI 02/26/2022 -(films personally reviewed) => Synergy XD 4.0 x 16 mm DES with postdilation using 4.0 mm [place] balloon-10% ISR.  Also short focal lesion just distal to the stent not involved with initial lesion. Diagnostic - Dominance: Right   Intervention     TTE 02/24/2022: EF 45 to 50%.  Mildly decreased function.  Basal RCA territory HK.  Severe asymmetric LVH-basal septal.  GR 2 DD-moderate LA dilation.  Trivial MR.  Mild calcific AS (mean AVG 10 mmHg, decreased valve excursion, may underestimate severity).  Normal RV size and function.  Mildly increased RV thickness.  Moderate RA dilation. => Compared to last echo, WMA new  Physical Exam   GEN: No acute distress.  Resting quietly in bed. Neck: No JVD Cardiac: RRR, 1/6 SEM @ RUSB, no R/G Respiratory: CTA B, nonlabored, good air movement.  poor effort. GI: Soft/NT/ND/NABS.  No HSM. MS: No C/C/E.  Access sites intact. Neuro:  Nonfocal  Psych: Anxious, somewhat forgetful.   Labs    High Sensitivity Troponin:   Recent Labs  Lab 02/23/22 1507 02/23/22 1741 02/23/22 2005  TROPONINIHS 382* 740* 1,867*     Chemistry Recent Labs  Lab 02/23/22 1507  02/23/22 1510 02/23/22 1741 02/24/22 0811 02/25/22 0626 02/26/22 0018 02/27/22 0639  NA 136   < > 137   < > 133* 128* 131*  K 3.0*   < > 2.4*   < > 4.2 5.2* 4.0  CL 98   < > 100   < > 102 93* 94*  CO2 29  --  25   < > '22 27 25  '$ GLUCOSE 139*   < > 307*   < > 117* 123* 110*  BUN 7*   < > 6*   < > 5* 11 9  CREATININE 0.77   < > 0.59   < > 0.56 0.61 0.69  CALCIUM 8.6*  --  6.7*   < > 8.7* 8.8* 9.7  MG  --   --  1.5*  --  1.9  --   --   PROT 6.0*  --  4.4*  --   --   --   --   ALBUMIN 3.3*  --  2.5*  --   --   --   --   AST 22  --  20  --   --   --   --   ALT 10  --  8  --   --   --   --   ALKPHOS 101  --  76  --   --   --   --   BILITOT 0.9  --  0.9  --   --   --   --   GFRNONAA >60  --  >60   < > >60 >60 >60  ANIONGAP 9  --  12   < > '9 8 12   '$ < > = values in this interval not displayed.    Lipids  Recent Labs  Lab 02/24/22 0811  CHOL 130  TRIG 43  HDL 47  LDLCALC 74  CHOLHDL 2.8    Hematology Recent Labs  Lab 02/25/22 0626 02/26/22 0018 02/27/22 0639  WBC 9.7 8.4 7.9  RBC 5.19* 5.13* 4.95  HGB 15.0 15.0 14.3  HCT 47.5* 46.1* 44.6  MCV 91.5 89.9 90.1  MCH 28.9 29.2 28.9  MCHC 31.6 32.5 32.1  RDW 13.3 13.2 13.2  PLT 319 349 397   Thyroid  Recent Labs  Lab 02/23/22 1741  TSH 1.087    BNPNo results for input(s): "BNP", "PROBNP" in the last 168 hours.  DDimer No results for input(s): "DDIMER" in the last 168 hours.   Radiology    No new studies    For questions or updates, please contact [place] Please consult [email or web address] for contact info under        Signed, Glenetta Hew, MD  02/27/2022, 3:54 PM

## 2022-02-27 NOTE — Evaluation (Signed)
Occupational Therapy Evaluation Patient Details Name: [name] MRN: [id number] DOB: Jan 07, 1930 Today's Date: 02/27/2022   History of Present Illness 88 y.o. female admitted 10/13 with STEMI with complex single vessel CAD on cath 10/13. Return to cath lab 10/16 for RCA with atherectomy. PMhx:  non-obs CAD, Takotsubo CM w/ EF 40%, CVA, HTN, HLD, IDA, OA, OSA not on CPAP, DVT/PE not on A/C, HFpEF, falls, breast CA s/p lumpectomy, Lt hip fx s/p IM nail   Clinical Impression   [name] was evaluated s/p the above admission list, she lives alone at an ILF and is mod I for BADLs. Pt's daughters report concern with pt's ability to accurately manage her medication and state she is not consistently walking to the dining hall for meals. Upon evaluation pt demonstrated limitations due to generalized weakness, decreased activity tolerance, chest and back pain, and impaired cognition. Overall she required supervision -min G fro mobility with RW. And up ot mod A fro LB ADLs. She will benefit from OT acutely. Recommend d/c to SNF for continued therapies.      Recommendations for follow up therapy are one component of a multi-disciplinary discharge planning process, led by the attending physician.  Recommendations may be updated based on patient status, additional functional criteria and insurance authorization.   Follow Up Recommendations  Skilled nursing-short term rehab (<3 hours/day)    Assistance Recommended at Discharge Frequent or constant Supervision/Assistance  Patient can return home with the following A little help with walking and/or transfers;A little help with bathing/dressing/bathroom;Assistance with cooking/housework;Assistance with feeding;Assist for transportation;Help with stairs or ramp for entrance    Functional Status Assessment  Patient has had a recent decline in their functional status and demonstrates the ability to make significant improvements in function in a reasonable and  predictable amount of time.  Equipment Recommendations  None recommended by OT    Recommendations for Other Services       Precautions / Restrictions Precautions Precautions: Other (comment);Fall Precaution Comments: 10/13 radial heart cath. 10/16 Rt radial and femoral cath access      Mobility Bed Mobility Overal bed mobility: Needs Assistance Bed Mobility: Supine to Sit, Sit to Supine     Supine to sit: Supervision Sit to supine: Supervision        Transfers Overall transfer level: Needs assistance Equipment used: Rolling walker (2 wheels) Transfers: Sit to/from Stand Sit to Stand: Min guard                  Balance Overall balance assessment: History of Falls, Needs assistance Sitting-balance support: No upper extremity supported, Feet supported Sitting balance-Leahy Scale: Fair     Standing balance support: Bilateral upper extremity supported, During functional activity Standing balance-Leahy Scale: Poor                             ADL either performed or assessed with clinical judgement   ADL Overall ADL's : Needs assistance/impaired Eating/Feeding: Set up;Sitting   Grooming: Set up;Sitting   Upper Body Bathing: Minimal assistance;Sitting   Lower Body Bathing: Moderate assistance;Sit to/from stand   Upper Body Dressing : Set up;Sitting   Lower Body Dressing: Moderate assistance;Sit to/from stand   Toilet Transfer: Minimal assistance;Ambulation;Rolling walker (2 wheels)   Toileting- Clothing Manipulation and Hygiene: Min guard;Sit to/from stand       Functional mobility during ADLs: Minimal assistance;Rolling walker (2 wheels) General ADL Comments: generalized weakness, decreased activty tolerance, poor balance  Vision Baseline Vision/History: 1 Wears glasses Ability to See in Adequate Light: 0 Adequate Vision Assessment?: No apparent visual deficits     Perception     Praxis      Pertinent Vitals/Pain Pain  Assessment Pain Assessment: Faces Faces Pain Scale: Hurts a little bit Pain Location: throat, back Pain Descriptors / Indicators: Sore Pain Intervention(s): Limited activity within patient's tolerance, Monitored during session     Hand Dominance Right   Extremity/Trunk Assessment Upper Extremity Assessment Upper Extremity Assessment: Generalized weakness   Lower Extremity Assessment Lower Extremity Assessment: Generalized weakness   Cervical / Trunk Assessment Cervical / Trunk Assessment: Kyphotic   Communication Communication Communication: HOH   Cognition Arousal/Alertness: Awake/alert Behavior During Therapy: Flat affect Overall Cognitive Status: Impaired/Different from baseline Area of Impairment: Memory, Following commands, Safety/judgement, Awareness, Problem solving                     Memory: Decreased short-term memory Following Commands: Follows one step commands consistently Safety/Judgement: Decreased awareness of safety, Decreased awareness of deficits Awareness: Emergent Problem Solving: Slow processing, Requires verbal cues General Comments: Pt's daughters reports her STM has been declining, she has not been taking her medications accurately and she refuses staff assist at [place]. Pt followed most simple commands with increased time for processing.     General Comments  VSS on RA, daughter present     Home Living Family/patient expects to be discharged to:: Private residence (ILF) Living Arrangements: Alone Available Help at Discharge: Available PRN/intermittently Type of Home: Independent living facility Home Access: Level entry     Home Layout: One level     Bathroom Shower/Tub: Occupational psychologist: Standard Bathroom Accessibility: Yes How Accessible: Accessible via walker Home Equipment: Shower seat - built in;Grab bars - tub/shower;Grab bars - toilet;Rolling Walker (2 wheels)          Prior Functioning/Environment  Prior Level of Function : Needs assist             Mobility Comments: ambulates with RW ADLs Comments: Pt's daughters reported pt refuses assist for medication and has not been walking to dining room for meals. Otherwise indep for BADLs.        OT Problem List: Decreased strength;Decreased range of motion;Decreased activity tolerance;Decreased safety awareness;Pain      OT Treatment/Interventions: Self-care/ADL training;Therapeutic exercise;DME and/or AE instruction;Therapeutic activities;Patient/family education;Balance training    OT Goals(Current goals can be found in the care plan section) Acute Rehab OT Goals Patient Stated Goal: less pain OT Goal Formulation: With patient Time For Goal Achievement: 03/13/22 Potential to Achieve Goals: Good ADL Goals Pt Will Perform Grooming: with supervision;standing Pt Will Perform Upper Body Dressing: Independently Pt Will Perform Lower Body Dressing: with supervision;sit to/from stand Pt Will Transfer to Toilet: with supervision;regular height toilet;ambulating Pt/caregiver will Perform Home Exercise Program: Increased strength;Increased ROM;Both right and left upper extremity;With written HEP provided;With Supervision  OT Frequency: Min 2X/week       AM-PAC OT "6 Clicks" Daily Activity     Outcome Measure Help from another person eating meals?: None Help from another person taking care of personal grooming?: A Little Help from another person toileting, which includes using toliet, bedpan, or urinal?: A Little Help from another person bathing (including washing, rinsing, drying)?: A Lot Help from another person to put on and taking off regular upper body clothing?: A Little Help from another person to put on and taking off regular lower body clothing?: A Lot 6  Click Score: 17   End of Session Equipment Utilized During Treatment: Rolling walker (2 wheels) Nurse Communication: Mobility status  Activity Tolerance: Patient tolerated  treatment well Patient left: in bed;with call bell/phone within reach;with bed alarm set;with family/visitor present  OT Visit Diagnosis: Unsteadiness on feet (R26.81);Other abnormalities of gait and mobility (R26.89);Muscle weakness (generalized) (M62.81);Pain                Time: 5726-2035 OT Time Calculation (min): 27 min Charges:  OT General Charges $OT Visit: 1 Visit OT Evaluation $OT Eval Moderate Complexity: 1 Mod OT Treatments $Self Care/Home Management : 8-22 mins   Elliot Cousin 02/27/2022, 11:01 AM

## 2022-02-27 NOTE — Care Management Important Message (Signed)
Important Message  Patient Details  Name: [name] MRN: [id number] Date of Birth: [birth year]   Medicare Important Message Given:  Yes     Trell Secrist 02/27/2022, 2:06 PM

## 2022-02-27 NOTE — TOC Initial Note (Signed)
Transition of Care [facility]) - Initial/Assessment Note    Patient Details  Name: [name] MRN: [id number] Date of Birth: [birth year]  [facility]) CM/SW Contact:    Tresa Endo Phone Number: 02/27/2022, 12:51 PM  Clinical Narrative:                 CSW received SNF consult. CSW met with pt and daughters at bedside. CSW introduced self and explained role at the hospital. Pt reports that PTA the pt was living at at the [facility] IDL. PT reports pt is minA and was able to walk 112f with walker.  CSW reviewed PT/OT recommendations for SNF. Pt is agreeable to SNF, [facility]. [facility] are able to [facility] pt, CSW will follow up with NLexine Batonwhen aJosem Kaufmannis approved.   CSW will continue to follow.    Expected Discharge Plan: Skilled Nursing Facility Barriers to Discharge: Continued Medical Work up   Patient Goals and CMS Choice Patient states their goals for this hospitalization and ongoing recovery are:: Rehab CMS Medicare.gov Compare Post Acute Care list provided to:: Patient Choice offered to / list presented to : Patient  Expected Discharge Plan and Services Expected Discharge Plan: [place] Referral: Clinical Social Work   Post Acute Care [place]: [place] the [place]                                     Prior Living Arrangements/Services Living arrangements for the past 2 months: [place]ives with:: Self Patient language and need for interpreter reviewed:: Yes Do you feel safe going back to the place where you live?: Yes      Need for Family Participation in Patient Care: Yes (Comment) Care giver support system in place?: Yes (comment)   Criminal Activity/Legal Involvement Pertinent to Current  Situation/Hospitalization: No - Comment as needed  Activities of Daily Living      Permission Sought/Granted Permission sought to share information with : Family Supports, FCustomer service managerPermission granted to share information with : Yes, Verbal Permission Granted  Share Information with NAME: HJosepha Pigg(Daughter)   [phone] Permission granted to share info w AGENCY: SNF  Permission granted to share info w Relationship: HJosepha Pigg(Daughter)   [phone] Permission granted to share info w Contact Information: HJosepha Pigg(Daughter)   [phone] Emotional Assessment Appearance:: Appears stated age Attitude/Demeanor/Rapport: Gracious Affect (typically observed): Accepting, Appropriate Orientation: : Oriented to Self, Oriented to Place, Oriented to  Time, Oriented to Situation Alcohol / Substance Use: Not Applicable Psych Involvement: No (comment)  Admission diagnosis:  STEMI involving right coronary artery (HCC) [I21.11] Patient Active Problem List   Diagnosis Date Noted   Presence of drug-eluting stent in right coronary artery    Dilutional hyponatremia 02/25/2022   ST elevation myocardial infarction involving right coronary artery (HCC)    Paroxysmal atrial fibrillation (HCC)    Closed right radial fracture 11/25/2021   Protein-calorie malnutrition, severe 10/09/2021   Acute encephalopathy 10/07/2021   Compression fracture of T7 vertebra  10/07/2021   Rib pain on right side 01/24/2021   PE (pulmonary thromboembolism) ([place] 12/25/2020   Lumbar compression fracture ([place] 11/16/2020  GERD (gastroesophageal reflux disease)    DVT (deep venous thrombosis) (HCC)    Diverticulitis    Coronary artery disease involving native coronary artery of native heart with unstable angina pectoris ([place])    Acute midline thoracic back pain 03/04/2020   Closed left hip fracture ([place]) 12/24/2019   ICH (intracerebral hemorrhage) ([place]) 11/04/2019   Intractable  back pain 09/13/2019   Compression fracture of T9 vertebra (HCC) 09/13/2019   Chronic diastolic CHF (congestive heart failure) ([place]) 09/13/2019   Overweight (BMI 25.0-29.9) 09/13/2019   Constipation 09/13/2019   Raynaud's phenomenon without gangrene 05/19/2018   Iron deficiency anemia due to chronic blood loss 12/30/2017   Insomnia 06/25/2017   Mild CAD 05/24/2017   Superficial thrombophlebitis 04/06/2014   Breast cancer ([place]) 11/05/2011   Polycythemia 11/05/2011   Stroke ([place]) 03/29/2011   OSA (obstructive sleep apnea) 03/29/2011   OA (osteoarthritis) of knee 03/29/2011   Hiatal hernia 03/29/2011   E. coli gastroenteritis 03/29/2011   Depression with anxiety 03/29/2011   TIA (transient ischemic attack) 01/16/2011   CARDIOMYOPATHY 08/03/2009   DJD -- pain mgmt 08/16/2008   GAIT DISTURBANCE 10/21/2007   ANXIETY DEPRESSION 10/08/2007   Hyperlipidemia with target LDL less than 70 06/25/2007   Takotsubo cardiomyopathy 06/25/2007   GERD without esophagitis 06/25/2007   Essential hypertension 09/30/2006   Migraine with aura 09/30/2006   Hyperplastic colon polyp 06/2001   PCP:  Colon Branch, MD Pharmacy:   [facility] [phone] [address], [address] AT [facility] OF [place] & [place] [address] Phone: [phone] Fax: [place] [address] [place] [address] Phone: [phone] Fax: [place] [address] [place] [address] Phone: [phone] Fax: [phone]     Social Determinants of Health (SDOH) Interventions    Readmission Risk Interventions    09/14/2019   11:11 AM  Readmission Risk Prevention Plan  Medication Screening Complete  Transportation Screening Complete

## 2022-02-27 NOTE — Progress Notes (Signed)
Mobility Specialist Progress Note   02/27/22 1215  Mobility  Activity Ambulated with assistance in hallway  Level of Assistance Standby assist, set-up cues, supervision of patient - no hands on  Assistive Device Front wheel walker  Distance Ambulated (ft) 100 ft  Range of Motion/Exercises Active;All extremities  Activity Response Tolerated well   Patient received in recliner agreeable to participate in mobility. Ambulated min guard to supervision with slow steady gait. Returned to room without complaint or incident. Was left in recliner with all needs met, call bell in reach.   [name], [place], [place]  Office: [phone]

## 2022-02-27 NOTE — NC FL2 (Signed)
[place] LEVEL OF CARE SCREENING TOOL     IDENTIFICATION  Patient Name: [name] Birthdate: 1929/05/28 Sex: female Admission Date (Current Location): 02/23/2022  [facility] and [place] Number:  Herbalist and Address:  [facility] [place], [place] [address] [place],  [address]      Provider Number: [id number]  Attending Physician Name and Address:  Martinique, Peter M, MD  Relative Name and Phone Number:  [name] (Daughter)   [phone]    Current Level of Care: Hospital Recommended Level of Care: [place] Prior Approval Number:    Date Approved/Denied:   PASRR Number: [id number]  Discharge Plan: SNF    Current Diagnoses: Patient Active Problem List   Diagnosis Date Noted   Presence of drug-eluting stent in right coronary artery    Dilutional hyponatremia 02/25/2022   ST elevation myocardial infarction involving right coronary artery [facility])    Paroxysmal atrial fibrillation (HCC)    Closed right radial fracture 11/25/2021   Protein-calorie malnutrition, severe 10/09/2021   Acute encephalopathy 10/07/2021   Compression fracture of T7 vertebra  10/07/2021   Rib pain on right side 01/24/2021   PE (pulmonary thromboembolism) ([place]) 12/25/2020   Lumbar compression fracture ([place]) 11/16/2020   GERD (gastroesophageal reflux disease)    DVT (deep venous thrombosis) ([place])    Diverticulitis    Coronary artery disease involving native coronary artery of native heart with unstable angina pectoris ([place])    Acute midline thoracic back pain 03/04/2020   Closed left hip fracture ([place]) 12/24/2019   ICH (intracerebral hemorrhage) ([place]) 11/04/2019   Intractable back pain 09/13/2019   Compression fracture of T9 vertebra (HCC) 09/13/2019   Chronic diastolic CHF (congestive heart failure) ([place]) 09/13/2019   Overweight (BMI 25.0-29.9) 09/13/2019   Constipation 09/13/2019   Raynaud's phenomenon without gangrene  05/19/2018   Iron deficiency anemia due to chronic blood loss 12/30/2017   Insomnia 06/25/2017   Mild CAD 05/24/2017   Superficial thrombophlebitis 04/06/2014   Breast cancer ([place]) 11/05/2011   Polycythemia 11/05/2011   Stroke ([place]) 03/29/2011   OSA (obstructive sleep apnea) 03/29/2011   OA (osteoarthritis) of knee 03/29/2011   Hiatal hernia 03/29/2011   E. coli gastroenteritis 03/29/2011   Depression with anxiety 03/29/2011   TIA (transient ischemic attack) 01/16/2011   CARDIOMYOPATHY 08/03/2009   DJD -- pain mgmt 08/16/2008   GAIT DISTURBANCE 10/21/2007   ANXIETY DEPRESSION 10/08/2007   Hyperlipidemia with target LDL less than 70 06/25/2007   Takotsubo cardiomyopathy 06/25/2007   GERD without esophagitis 06/25/2007   Essential hypertension 09/30/2006   Migraine with aura 09/30/2006   Hyperplastic colon polyp 06/2001    Orientation RESPIRATION BLADDER Height & Weight     Self, Time, Situation, Place  Normal Incontinent, External catheter Weight: 126 lb 8.7 oz (57.4 kg) Height:     BEHAVIORAL SYMPTOMS/MOOD NEUROLOGICAL BOWEL NUTRITION STATUS      Continent Diet (See DC  Summary)  AMBULATORY STATUS COMMUNICATION OF NEEDS Skin   Limited Assist   Normal                       Personal Care Assistance Level of Assistance  Bathing, Feeding, Dressing Bathing Assistance: Limited assistance Feeding assistance: Independent Dressing Assistance: Limited assistance     Functional Limitations Info  Sight, Hearing, Speech Sight Info: Adequate Hearing Info: Adequate Speech Info: Adequate    SPECIAL CARE FACTORS FREQUENCY  PT (By licensed PT), OT (By licensed OT)  PT Frequency: 5x a week OT Frequency: 5x a week            Contractures Contractures Info: Not present    Additional Factors Info  Code Status, Allergies Code Status Info: Full Allergies Info: Penicillins   Valsartan   Atorvastatin   Carvedilol   Ezetimibe   Fluvastatin Sodium   Magnesium Hydroxide    Meloxicam   Pneumovax (Pneumococcal Polysaccharide Vaccine)   Quinapril Hcl   Simvastatin   Topamax (Topiramate)   Vit D-vit E-safflower Oil           Current Medications (02/27/2022):  This is the current hospital active medication list Current Facility-Administered Medications  Medication Dose Route Frequency Provider Last Rate Last Admin   0.9 %  sodium chloride infusion  250 mL Intravenous PRN End, Harrell Gave, MD       0.9 %  sodium chloride infusion  250 mL Intravenous PRN End, Harrell Gave, MD       acetaminophen (TYLENOL) tablet 650 mg  650 mg Oral Q4H PRN End, Harrell Gave, MD   650 mg at 02/27/22 0110   amiodarone (PACERONE) tablet 400 mg  400 mg Oral Daily Almyra Deforest, [place]   400 mg at 02/27/22 1023   Followed by   Derrill Memo ON 03/03/2022] amiodarone (PACERONE) tablet 200 mg  200 mg Oral Daily [place], [place], [place]       amLODipine (NORVASC) tablet 5 mg  5 mg Oral Daily Cherlynn Kaiser A, MD   5 mg at 02/27/22 1023   apixaban (ELIQUIS) tablet 2.5 mg  2.5 mg Oral BID Leonie Man, MD   2.5 mg at 02/27/22 1023   aspirin EC tablet 81 mg  81 mg Oral Daily End, Christopher, MD   81 mg at 02/27/22 1023   calcium carbonate (TUMS - dosed in mg elemental calcium) chewable tablet 200 mg of elemental calcium  1 tablet Oral TID PRN Temple Pacini, MD   200 mg of elemental calcium at 02/27/22 1023   Chlorhexidine Gluconate Cloth 2 % PADS 6 each  6 each Topical Daily Martinique, Peter M, MD   6 each at 02/25/22 0937   [START ON 02/28/2022] clopidogrel (PLAVIX) tablet 75 mg  75 mg Oral Daily Leonie Man, MD       LORazepam (ATIVAN) tablet 0.5 mg  0.5 mg Oral Q8H PRN End, Harrell Gave, MD   0.5 mg at 02/27/22 1203   melatonin tablet 5 mg  5 mg Oral QHS Doyne Keel., MD   5 mg at 02/26/22 2232   metoprolol tartrate (LOPRESSOR) tablet 50 mg  50 mg Oral BID End, Christopher, MD   50 mg at 02/27/22 1023   nitroGLYCERIN (NITROSTAT) SL tablet 0.4 mg  0.4 mg Sublingual Q5 Min x 3 PRN End, Harrell Gave, MD        ondansetron (ZOFRAN) injection 4 mg  4 mg Intravenous Q6H PRN End, Christopher, MD   4 mg at 02/27/22 0058   oxyCODONE (Oxy IR/ROXICODONE) immediate release tablet 2.5-5 mg  2.5-5 mg Oral Q6H PRN End, Harrell Gave, MD   5 mg at 02/27/22 0110   rosuvastatin (CRESTOR) tablet 20 mg  20 mg Oral Daily End, Christopher, MD   20 mg at 02/27/22 1023   sodium chloride flush (NS) 0.9 % injection 3 mL  3 mL Intravenous Q12H End, Christopher, MD   3 mL at 02/26/22 2235   sodium chloride flush (NS) 0.9 % injection 3 mL  3 mL Intravenous PRN End, Harrell Gave, MD  sodium chloride flush (NS) 0.9 % injection 3 mL  3 mL Intravenous Q12H End, Christopher, MD   3 mL at 02/26/22 2235   sodium chloride flush (NS) 0.9 % injection 3 mL  3 mL Intravenous PRN End, Harrell Gave, MD         Discharge Medications: Please see discharge summary for a list of discharge medications.  Relevant Imaging Results:  Relevant Lab Results:   Additional Information SSN [id number]  Reece Agar, LCSWA

## 2022-02-27 NOTE — Evaluation (Signed)
Physical Therapy Evaluation Patient Details Name: [name] MRN: [id number] DOB: 12/17/1929 Today's Date: 02/27/2022  History of Present Illness  88 y.o. female admitted 10/13 with STEMI with complex single vessel CAD on cath 10/13. Return to cath lab 10/16 for RCA with atherectomy. PMhx:  non-obs CAD, Takotsubo CM w/ EF 40%, CVA, HTN, HLD, IDA, OA, OSA not on CPAP, DVT/PE not on A/C, HFpEF, falls, breast CA s/p lumpectomy, Lt hip fx s/p IM nail  Clinical Impression  Pt pleasant, perseverating on sore throat and reports she lives at [facility] however per web search appears facility is ILF not ALF. Pt with decreased strength, transfers, gait and safety who will benefit from acute therapy to maximize safety, function and decrease fall risk. Pt reports multiple falls, that she has lost her life alert and facility provides meals and cleaning but otherwise pt cares for herself. Pt reporting chest pain with gait with VSS and denied further gait or sitting up in chair.   HR 77-83 Before gait 150/91 Post gait 149/79     Recommendations for follow up therapy are one component of a multi-disciplinary discharge planning process, led by the attending physician.  Recommendations may be updated based on patient status, additional functional criteria and insurance authorization.  Follow Up Recommendations Skilled nursing-short term rehab (<3 hours/day) (unless ALF can manage medication and assist with transfers with addition of HHPT) Can patient physically be transported by private vehicle: Yes    Assistance Recommended at Discharge Intermittent Supervision/Assistance  Patient can return home with the following  A little help with walking and/or transfers;A little help with bathing/dressing/bathroom;Assistance with cooking/housework;Direct supervision/assist for financial management;Assist for transportation;Two people to help with bathing/dressing/bathroom;Direct supervision/assist for  medications management    Equipment Recommendations None recommended by PT  Recommendations for Other Services       Functional Status Assessment Patient has had a recent decline in their functional status and demonstrates the ability to make significant improvements in function in a reasonable and predictable amount of time.     Precautions / Restrictions Precautions Precautions: Other (comment);Fall Precaution Comments: 10/13 radial heart cath. 10/16 Rt radial and femoral cath access      Mobility  Bed Mobility Overal bed mobility: Needs Assistance Bed Mobility: Sit to Supine, Supine to Sit     Supine to sit: Supervision Sit to supine: Supervision   General bed mobility comments: supervision for safety, lines and HOB flat to return to bed    Transfers Overall transfer level: Needs assistance   Transfers: Sit to/from Stand Sit to Stand: Min guard           General transfer comment: cues for hand placement to rise from bed and toilet with rail    Ambulation/Gait Ambulation/Gait assistance: Min guard Gait Distance (Feet): 120 Feet Assistive device: Rolling walker (2 wheels) Gait Pattern/deviations: Step-through pattern, Decreased stride length   Gait velocity interpretation: <1.8 ft/sec, indicate of risk for recurrent falls   General Gait Details: cues for safety, proximity to RW and direction  Stairs            Wheelchair Mobility    Modified Rankin (Stroke Patients Only)       Balance Overall balance assessment: History of Falls, Needs assistance Sitting-balance support: No upper extremity supported, Feet supported Sitting balance-Leahy Scale: Fair Sitting balance - Comments: static sitting EOB and toilet without assist   Standing balance support: Bilateral upper extremity supported Standing balance-Leahy Scale: Poor Standing balance comment: bil UE on RW  Pertinent Vitals/Pain Pain Assessment Pain  Assessment: 0-10 Pain Score: 3  Pain Location: throat Pain Descriptors / Indicators: Sore Pain Intervention(s): Limited activity within patient's tolerance, Patient requesting pain meds-RN notified, Repositioned    Home Living Family/patient expects to be discharged to:: Private residence Living Arrangements: Alone   Type of Home: Independent living facility Home Access: Level entry       Home Layout: One level Home Equipment: Conservation officer, nature (2 wheels)      Prior Function Prior Level of Function : Needs assist             Mobility Comments: walks with rW to dining hall, reports 4-5 falls in the last 2 years ADLs Comments: assist for meals and homemaking, reports bathing and dressing on her own     Hand Dominance        Extremity/Trunk Assessment   Upper Extremity Assessment Upper Extremity Assessment: Generalized weakness    Lower Extremity Assessment Lower Extremity Assessment: Generalized weakness    Cervical / Trunk Assessment Cervical / Trunk Assessment: Kyphotic  Communication   Communication: HOH  Cognition Arousal/Alertness: Awake/alert Behavior During Therapy: Flat affect Overall Cognitive Status: Impaired/Different from baseline Area of Impairment: Memory, Safety/judgement, Problem solving                     Memory: Decreased short-term memory   Safety/Judgement: Decreased awareness of deficits   Problem Solving: Slow processing, Requires verbal cues          General Comments      Exercises     Assessment/Plan    PT Assessment Patient needs continued PT services  PT Problem List Decreased strength;Decreased mobility;Decreased activity tolerance;Decreased balance;Decreased knowledge of use of DME       PT Treatment Interventions Gait training;DME instruction;Therapeutic exercise;Balance training;Functional mobility training;Therapeutic activities;Patient/family education;Cognitive remediation    PT Goals (Current goals can  be found in the Care Plan section)  Acute Rehab PT Goals Patient Stated Goal: return home PT Goal Formulation: With patient Time For Goal Achievement: 03/13/22 Potential to Achieve Goals: Good    Frequency Min 3X/week     Co-evaluation               AM-PAC PT "6 Clicks" Mobility  Outcome Measure Help needed turning from your back to your side while in a flat bed without using bedrails?: A Little Help needed moving from lying on your back to sitting on the side of a flat bed without using bedrails?: A Little Help needed moving to and from a bed to a chair (including a wheelchair)?: A Little Help needed standing up from a chair using your arms (e.g., wheelchair or bedside chair)?: A Little Help needed to walk in hospital room?: A Little Help needed climbing 3-5 steps with a railing? : A Lot 6 Click Score: 17    End of Session   Activity Tolerance: Patient tolerated treatment well Patient left: in bed;with call bell/phone within reach;with bed alarm set (pt refused OOB to chair) Nurse Communication: Mobility status;Precautions PT Visit Diagnosis: Other abnormalities of gait and mobility (R26.89);Difficulty in walking, not elsewhere classified (R26.2);Muscle weakness (generalized) (M62.81)    Time: 6010-9323 PT Time Calculation (min) (ACUTE ONLY): 21 min   Charges:   PT Evaluation $PT Eval Moderate Complexity: 1 Mod          Neida Ellegood P, PT Acute Rehabilitation Services Office: [phone]   Sandy Salaam Kimi Bordeau 02/27/2022, 9:09 AM

## 2022-02-27 NOTE — Progress Notes (Addendum)
Seen pt for education, pt has walked with PT earlier. Pt was educated with family present about restrictions, stent location, and Birlinta and Aspirin med use. Pt family informed me that pt will go to SNF then nursing home. Pt was helped to [facility] and back to bed prior to leaving. CRPII is N/A   Christen Bame 02/27/2022 11:26 AM

## 2022-02-28 ENCOUNTER — Telehealth: Payer: Self-pay | Admitting: Internal Medicine

## 2022-02-28 DIAGNOSIS — I2699 Other pulmonary embolism without acute cor pulmonale: Secondary | ICD-10-CM | POA: Diagnosis not present

## 2022-02-28 DIAGNOSIS — R2681 Unsteadiness on feet: Secondary | ICD-10-CM | POA: Diagnosis not present

## 2022-02-28 DIAGNOSIS — I482 Chronic atrial fibrillation, unspecified: Secondary | ICD-10-CM | POA: Diagnosis not present

## 2022-02-28 DIAGNOSIS — I213 ST elevation (STEMI) myocardial infarction of unspecified site: Secondary | ICD-10-CM | POA: Diagnosis not present

## 2022-02-28 DIAGNOSIS — I428 Other cardiomyopathies: Secondary | ICD-10-CM | POA: Diagnosis not present

## 2022-02-28 DIAGNOSIS — E871 Hypo-osmolality and hyponatremia: Secondary | ICD-10-CM | POA: Diagnosis not present

## 2022-02-28 DIAGNOSIS — I480 Paroxysmal atrial fibrillation: Secondary | ICD-10-CM | POA: Diagnosis not present

## 2022-02-28 DIAGNOSIS — I5020 Unspecified systolic (congestive) heart failure: Secondary | ICD-10-CM | POA: Diagnosis not present

## 2022-02-28 DIAGNOSIS — E785 Hyperlipidemia, unspecified: Secondary | ICD-10-CM | POA: Diagnosis not present

## 2022-02-28 DIAGNOSIS — I69891 Dysphagia following other cerebrovascular disease: Secondary | ICD-10-CM | POA: Diagnosis not present

## 2022-02-28 DIAGNOSIS — I10 Essential (primary) hypertension: Secondary | ICD-10-CM | POA: Diagnosis not present

## 2022-02-28 DIAGNOSIS — D500 Iron deficiency anemia secondary to blood loss (chronic): Secondary | ICD-10-CM | POA: Diagnosis not present

## 2022-02-28 DIAGNOSIS — I2111 ST elevation (STEMI) myocardial infarction involving right coronary artery: Secondary | ICD-10-CM | POA: Diagnosis not present

## 2022-02-28 DIAGNOSIS — R2689 Other abnormalities of gait and mobility: Secondary | ICD-10-CM | POA: Diagnosis not present

## 2022-02-28 DIAGNOSIS — I5032 Chronic diastolic (congestive) heart failure: Secondary | ICD-10-CM | POA: Diagnosis not present

## 2022-02-28 DIAGNOSIS — Z48812 Encounter for surgical aftercare following surgery on the circulatory system: Secondary | ICD-10-CM | POA: Diagnosis not present

## 2022-02-28 DIAGNOSIS — I7300 Raynaud's syndrome without gangrene: Secondary | ICD-10-CM | POA: Diagnosis not present

## 2022-02-28 DIAGNOSIS — I69828 Other speech and language deficits following other cerebrovascular disease: Secondary | ICD-10-CM | POA: Diagnosis not present

## 2022-02-28 DIAGNOSIS — C50919 Malignant neoplasm of unspecified site of unspecified female breast: Secondary | ICD-10-CM | POA: Diagnosis not present

## 2022-02-28 DIAGNOSIS — Z955 Presence of coronary angioplasty implant and graft: Secondary | ICD-10-CM | POA: Diagnosis not present

## 2022-02-28 DIAGNOSIS — Z9181 History of falling: Secondary | ICD-10-CM | POA: Diagnosis not present

## 2022-02-28 DIAGNOSIS — I25110 Atherosclerotic heart disease of native coronary artery with unstable angina pectoris: Secondary | ICD-10-CM | POA: Diagnosis not present

## 2022-02-28 DIAGNOSIS — M6281 Muscle weakness (generalized): Secondary | ICD-10-CM | POA: Diagnosis not present

## 2022-02-28 LAB — BASIC METABOLIC PANEL
Anion gap: 5 (ref 5–15)
BUN: <5 mg/dL — ABNORMAL LOW (ref 8–23)
CO2: 28 mmol/L (ref 22–32)
Calcium: 9 mg/dL (ref 8.9–10.3)
Chloride: 100 mmol/L (ref 98–111)
Creatinine, Ser: 0.61 mg/dL (ref 0.44–1.00)
GFR, Estimated: >60 mL/min (ref >60)
Glucose, Bld: 112 mg/dL — ABNORMAL HIGH (ref 70–99)
Potassium: 3.7 mmol/L (ref 3.5–5.1)
Sodium: 133 mmol/L — ABNORMAL LOW (ref 135–145)

## 2022-02-28 LAB — CBC
HCT: 42.2 % (ref 36.0–46.0)
Hemoglobin: 13.4 g/dL (ref 12.0–15.0)
MCH: 28.4 pg (ref 26.0–34.0)
MCHC: 31.8 g/dL (ref 30.0–36.0)
MCV: 89.4 fL (ref 80.0–100.0)
Platelets: 399 10*3/uL (ref 150–400)
RBC: 4.72 MIL/uL (ref 3.87–5.11)
RDW: 13.2 % (ref 11.5–15.5)
WBC: 5.7 10*3/uL (ref 4.0–10.5)
nRBC: 0 % (ref 0.0–0.2)

## 2022-02-28 MED ORDER — NITROGLYCERIN 0.4 MG SL SUBL: 0.4000 mg | SUBLINGUAL_TABLET | SUBLINGUAL | 12 refills | Status: AC | PRN

## 2022-02-28 MED ORDER — ROSUVASTATIN CALCIUM 20 MG PO TABS: 20.0000 mg | ORAL_TABLET | Freq: Every day | ORAL | Status: AC

## 2022-02-28 MED ORDER — FUROSEMIDE 20 MG PO TABS: 20.0000 mg | ORAL_TABLET | Freq: Every day | ORAL | 1 refills | Status: DC | PRN

## 2022-02-28 MED ORDER — APIXABAN 2.5 MG PO TABS: 2.5000 mg | ORAL_TABLET | Freq: Two times a day (BID) | ORAL | Status: DC

## 2022-02-28 MED ORDER — FUROSEMIDE 20 MG PO TABS: 20.0000 mg | ORAL_TABLET | Freq: Every day | ORAL | Status: DC | PRN

## 2022-02-28 MED ORDER — CLOPIDOGREL BISULFATE 75 MG PO TABS: 75.0000 mg | ORAL_TABLET | Freq: Every day | ORAL | Status: AC

## 2022-02-28 MED ORDER — AMIODARONE HCL 400 MG PO TABS: 400.0000 mg | ORAL_TABLET | Freq: Every day | ORAL | Status: DC

## 2022-02-28 MED ORDER — PANTOPRAZOLE SODIUM 40 MG PO TBEC: 40.0000 mg | DELAYED_RELEASE_TABLET | Freq: Every day | ORAL | Status: DC

## 2022-02-28 MED ORDER — PANTOPRAZOLE SODIUM 40 MG PO TBEC: 40.0000 mg | DELAYED_RELEASE_TABLET | Freq: Every day | ORAL | Status: AC

## 2022-02-28 MED FILL — Heparin Sod (Porcine)-NaCl IV Soln 1000 Unit/500ML-0.9%: INTRAVENOUS | Qty: 1000 | Status: AC

## 2022-02-28 MED FILL — Nitroglycerin IV Soln 100 MCG/ML in D5W: INTRA_ARTERIAL | Qty: 10 | Status: AC

## 2022-02-28 NOTE — Progress Notes (Signed)
Rounding Note    Patient Name: [name] Date of Encounter: 02/28/2022  [facility] Cardiologist: Shirlee More, MD   Patient Profile     88 y.o. female with non-obs CAD 2003 [place]ubo CM w/ EF 40% (60% 2009), CVA, HTN, HLD, IDA, OA, OSA not on CPAP, DVT/PE not on A/C, HFpEF, falls, admitted for STEMI, with complex single vessel CAD in RCA likely requiring atherectomy => planned for staged RCA atherectomy-PCI 10/16. Noted to be in A-fib upon arrival, on IV amiodarone. Staged Atherectomy DES PCI RCA 02/26/22   Assessment & Plan    Principal Problem:   ST elevation myocardial infarction involving right coronary artery [facility]) Active Problems:   Coronary artery disease involving native coronary artery of native heart with unstable angina pectoris (HCC)   Chronic diastolic CHF (congestive heart failure) (HCC)   DVT (deep venous thrombosis) (HCC)-history of   PE (pulmonary thromboembolism) (HCC)-history of   Paroxysmal atrial fibrillation (HCC)   Hyperlipidemia with target LDL less than 70   Essential hypertension   OSA (obstructive sleep apnea)   Iron deficiency anemia due to chronic blood loss   Dilutional hyponatremia   Presence of drug-eluting stent in right coronary artery  Principal Problem:   ST elevation myocardial infarction involving right coronary artery ([place])   Coronary artery disease involving native coronary artery of native heart with unstable angina pectoris ([place]) => Successful DES PCI to the RCA with atherectomy and shockwave lithotripsy. Continued on modest dose rosuvastatin 20 mg and Lopressor 50 mg twice daily RCA PCI with atherectomy and DES-performed by Dr. Saunders Revel on 10/16 -> excellent result. Initially loaded with 180 mg Brilinta on 02/23/2022 -> converted to Plavix for Eliquis)-DC aspirin on discharge. (To   Chronic diastolic CHF (congestive heart failure) (HCC) -> GRII DD with severe baseline septal LVH on echo Appears to be euvolemic. ->   Hold off on diuretic for now.  Appears to be auto diuresing. We will provide low-dose Lasix-20 mg as needed for weight gain more than 3 pounds in a day or significant symptoms or orthopnea. On stable dose Lopressor with home amlodipine added, once we have determined renal status post cath, could consider low-dose ARB instead of amlodipine. => Consider converting from amlodipine (has a listed allergy for ARB's)     Paroxysmal atrial fibrillation (HCC) -> currently on amiodarone-convert to p.o.  Maintaining sinus rhythm. Converted to p.o. amiodarone yesterday.  Plan is short load with 400 mg daily through 10/21, followed by 200 mg daily for 2 weeks followed by 1/2 tablet daily maintenance.   Would recommend continuing maintenance dose of 100 mg daily until discontinued by outpatient MD. On Eliquis 2.5 mg daily -> this will also help with her history of DVT and PE.  This patients CHA2DS2-VASc Score and unadjusted Ischemic Stroke Rate (% per year) is equal to 11.2 % stroke rate/year from a score of 7 -> need to determine appropriate treatment course: Above score calculated as 1 point each if present [CHF, HTN, DM, Vascular=MI/PAD/Aortic Plaque, Age if 65-74, or Female] Above score calculated as 2 points each if present [Age > 75, or Stroke/TIA/TE] Converted over to Plavix monotherapy yesterday with 300 mg load-yesterday, now on 75 mg daily.. DC aspirin on discharge. Added DOAC (Eliquis 2.5 mg BID - low wgt & age) today    Hyperlipidemia with target LDL less than 70 -> currently Crestor 20 mg daily Lab Results  Component Value Date   CHOL 130 02/24/2022   HDL 47  02/24/2022   LDLCALC 74 02/24/2022   TRIG 43 02/24/2022      Essential hypertension -> blood pressure seems to be a little more stable today.  But probably will be much more aggressive. Continue metoprolol titrate 50 mg twice daily,  amlodipine 5 mg daily restarted on 02/25/2022.  Would not be overly aggressive has had history of falls.   Would allow for mild permissive hypertension a lot of her blood pressures as high as 150 to 160 mmHg without concern.    OSA (obstructive sleep apnea) -> I do not think she was using CPAP at home.   Iron deficiency anemia due to chronic blood loss = stable blood count now.   Dilutional hyponatremia -> notably improved.  Still little low but stable.  Hypokalemia also resolved.  Heartburn-PPI on discharge.   DISPO: . FL 2 signed yesterday.  Apparently she does have a bed available at [facility] for [facility].  They have been have plan for her to go to assisted living after rehab.  This is great that she will have monitored care. As far as the anxiety goes, I think this is something that really should be managed in the outpatient setting and it would be best managed by the facilities provider maybe a month or 2 after her initial rehab.  I think it be best for her to be on some type of SSRI less of the as needed Ativan. Plan discharge today to [facility] rehab SNF  ===================================================================  Subjective   Feeling okay this morning.  Did not eat much and somewhat constipated.  Having hard on BMP but no anginal pain.  Still having some heartburn.  Less anxious.  Inpatient Medications    Scheduled Meds:  amiodarone  400 mg Oral Daily   Followed by   Derrill Memo ON 03/03/2022] amiodarone  200 mg Oral Daily   amLODipine  5 mg Oral Daily   apixaban  2.5 mg Oral BID   aspirin EC  81 mg Oral Daily   clopidogrel  75 mg Oral Daily   melatonin  5 mg Oral QHS   metoprolol tartrate  50 mg Oral BID   pantoprazole  40 mg Oral QHS   rosuvastatin  20 mg Oral Daily   sodium chloride flush  3 mL Intravenous Q12H   sodium chloride flush  3 mL Intravenous Q12H   Continuous Infusions:  sodium chloride     sodium chloride     PRN Meds: sodium chloride, sodium chloride, acetaminophen, calcium carbonate, furosemide, LORazepam, nitroGLYCERIN, ondansetron (ZOFRAN) IV,  oxyCODONE, sodium chloride flush, sodium chloride flush   Vital Signs    Vitals:   02/27/22 1941 02/27/22 2219 02/28/22 0543 02/28/22 0806  BP: (!) 144/88 (!) 142/84 (!) 156/73 120/68  Pulse: 76 75 67   Resp: '17 17 15 18  '$ Temp: 98.1 F (36.7 C) 98.2 F (36.8 C) 97.8 F (36.6 C) 97.8 F (36.6 C)  TempSrc: Oral Oral Oral Oral  SpO2: 96% 92% 96% 97%  Weight:        Intake/Output Summary (Last 24 hours) at 02/28/2022 1114 Last data filed at 02/28/2022 0546 Gross per 24 hour  Intake --  Output 700 ml  Net -700 ml      02/26/2022    4:19 AM 02/23/2022    4:00 PM 12/19/2021    3:33 PM  Last 3 Weights  Weight (lbs) 126 lb 8.7 oz 113 lb 8.6 oz 113 lb 8 oz  Weight (kg) 57.4 kg 51.5  kg 51.483 kg      Telemetry     stable SR- Personally Reviewed  ECG  No new studies personally Reviewed; stable   Cardiac Studies   CATH 02/23/2022: Ost-Prox RCA heavily calcified 95% (TIMI III flow), mid-dist LM 45%, mid LAD 30%, dist LAD 30%.  Ost-prox LCx 30%, RI 20%.  Normal LVEDP.  EF65% with no RWMA.   Staged Atherectomy-lithotripsy DES PCI 02/26/2022 -(films personally reviewed) => Synergy XD 4.0 x 16 mm DES with postdilation using 4.0 mm [place] balloon-10% ISR.  Also short focal lesion just distal to the stent not involved with initial lesion. Diagnostic - Dominance: Right   Intervention     TTE 02/24/2022: EF 45 to 50%.  Mildly decreased function.  Basal RCA territory HK.  Severe asymmetric LVH-basal septal.  GR 2 DD-moderate LA dilation.  Trivial MR.  Mild calcific AS (mean AVG 10 mmHg, decreased valve excursion, may underestimate severity).  Normal RV size and function.  Mildly increased RV thickness.  Moderate RA dilation. => Compared to last echo, WMA new  Physical Exam   GEN: Resting quietly in bed, NAD.  Was then up for BM and had some constipation.   Neck: No JVD or carotid bruit. Cardiac: RRR with 1/6 SEM at RUSB.  No other R/G. Respiratory: CTA B, nonlabored, good air movement.   poor effort. GI: Soft/NT/ND/NABS.  No HSM. MS: No C/C/E.  Access sites intact. Neuro:  Nonfocal  Psych: Less anxious today.  Somewhat forgetful.  Hard of hearing.   Labs    High Sensitivity Troponin:   Recent Labs  Lab 02/23/22 1507 02/23/22 1741 02/23/22 2005  TROPONINIHS 382* 740* 1,867*     Chemistry Recent Labs  Lab 02/23/22 1507 02/23/22 1510 02/23/22 1741 02/24/22 7026 02/25/22 0626 02/26/22 0018 02/27/22 0639 02/28/22 0024  NA 136   < > 137   < > 133* 128* 131* 133*  K 3.0*   < > 2.4*   < > 4.2 5.2* 4.0 3.7  CL 98   < > 100   < > 102 93* 94* 100  CO2 29  --  25   < > '22 27 25 28  '$ GLUCOSE 139*   < > 307*   < > 117* 123* 110* 112*  BUN 7*   < > 6*   < > 5* 11 9 <5*  CREATININE 0.77   < > 0.59   < > 0.56 0.61 0.69 0.61  CALCIUM 8.6*  --  6.7*   < > 8.7* 8.8* 9.7 9.0  MG  --   --  1.5*  --  1.9  --   --   --   PROT 6.0*  --  4.4*  --   --   --   --   --   ALBUMIN 3.3*  --  2.5*  --   --   --   --   --   AST 22  --  20  --   --   --   --   --   ALT 10  --  8  --   --   --   --   --   ALKPHOS 101  --  76  --   --   --   --   --   BILITOT 0.9  --  0.9  --   --   --   --   --   GFRNONAA >60  --  >60   < > >  60 >60 >60 >60  ANIONGAP 9  --  12   < > '9 8 12 5   '$ < > = values in this interval not displayed.    Lipids  Recent Labs  Lab 02/24/22 0811  CHOL 130  TRIG 43  HDL 47  LDLCALC 74  CHOLHDL 2.8    Hematology Recent Labs  Lab 02/26/22 0018 02/27/22 0639 02/28/22 0024  WBC 8.4 7.9 5.7  RBC 5.13* 4.95 4.72  HGB 15.0 14.3 13.4  HCT 46.1* 44.6 42.2  MCV 89.9 90.1 89.4  MCH 29.2 28.9 28.4  MCHC 32.5 32.1 31.8  RDW 13.2 13.2 13.2  PLT 349 397 399   Thyroid  Recent Labs  Lab 02/23/22 1741  TSH 1.087    BNPNo results for input(s): "BNP", "PROBNP" in the last 168 hours.  DDimer No results for input(s): "DDIMER" in the last 168 hours.   Radiology    No new studies    For questions or updates, please contact [place] Please  consult [email or web address] for contact info under        Signed, Glenetta Hew, MD  02/28/2022, 11:14 AM

## 2022-02-28 NOTE — TOC Transition Note (Signed)
Transition of Care [facility]) - CM/SW Discharge Note   Patient Details  Name: [name] MRN: [id number] Date of Birth: 1930-01-14  Transition of Care [facility]) CM/SW Contact:  Tresa Endo Phone Number: 02/28/2022, 10:25 AM   Clinical Narrative:    Patient will DC to: [facility] Anticipated DC date: 02/28/2022 Family notified: Pt Daughters Transport by: Daughter   Per MD patient ready for DC to [facility]. RN to call report prior to discharge [phone]). RN, patient, patient's family, and facility notified of DC. Discharge Summary and FL2 sent to facility.    CSW will sign off for now as social work intervention is no longer needed. Please consult [place] again if new needs arise.     Final next level of care: Skilled Nursing Facility Barriers to Discharge: Continued Medical Work up   Patient Goals and CMS Choice Patient states their goals for this hospitalization and ongoing recovery are:: Rehab CMS Medicare.gov Compare Post Acute Care list provided to:: Patient Choice offered to / list presented to : Patient  Discharge Placement                       Discharge Plan and Services In-house Referral: Clinical Social Work   Post Acute Care Choice: [place]                               Social Determinants of Health (SDOH) Interventions     Readmission Risk Interventions    09/14/2019   11:11 AM  Readmission Risk Prevention Plan  Medication Screening Complete  Transportation Screening Complete

## 2022-02-28 NOTE — Progress Notes (Signed)
Mobility Specialist Progress Note    02/28/22 1202  Mobility  Activity Ambulated with assistance in hallway  Level of Assistance Minimal assist, patient does 75% or more  Assistive Device Front wheel walker  Distance Ambulated (ft) 130 ft (80+50)  Activity Response Tolerated well  Mobility Referral Yes  $Mobility charge 1 Mobility   Pre-Mobility: 64 HR During Mobility: 68 HR Post-Mobility: 63 HR, 134/75 BP  Pt received in chair and agreeable. C/o fatigue, took x1 seated rest break. Returned to bed with call bell in reach.    Hildred Alamin Mobility Specialist  Secure Chat Only

## 2022-02-28 NOTE — Consult Note (Signed)
   [facility] Inpatient Consult   02/28/2022  [name] 1930-01-10 [id number]  [place] [facility] [ACO] Patient: [employer] Medicare  Primary Care Provider:  Colon Branch, MD, [facility]  If the patient goes to a [facility] affiliated facility then, patient can be followed by [place] [facility] PAC RN with traditional Medicare and approved Medicare Advantage plans.    Plan:   [facility] PAC RN can follow for any known or needs for transitional care needs for returning to post facility care or complex disease management. Patient to [place] will notify. [facility] PAC RN.  For questions or referrals, please contact:   Natividad Brood, RN BSN [place]  [phone] business mobile phone Toll free office [phone]  *[place]  [phone] Fax number: [phone] [email or web address]

## 2022-02-28 NOTE — Plan of Care (Signed)

## 2022-02-28 NOTE — Plan of Care (Signed)
  Problem: Education: Goal: Knowledge of General Education information will improve Description: Including pain rating scale, medication(s)/side effects and non-pharmacologic comfort measures Outcome: Adequate for Discharge   Problem: Health Behavior/Discharge Planning: Goal: Ability to manage health-related needs will improve Outcome: Adequate for Discharge   Problem: Clinical Measurements: Goal: Ability to maintain clinical measurements within normal limits will improve Outcome: Adequate for Discharge Goal: Will remain free from infection Outcome: Adequate for Discharge Goal: Diagnostic test results will improve Outcome: Adequate for Discharge Goal: Respiratory complications will improve Outcome: Adequate for Discharge Goal: Cardiovascular complication will be avoided Outcome: Adequate for Discharge   Problem: Activity: Goal: Risk for activity intolerance will decrease Outcome: Adequate for Discharge   Problem: Nutrition: Goal: Adequate nutrition will be maintained Outcome: Adequate for Discharge   Problem: Coping: Goal: Level of anxiety will decrease Outcome: Adequate for Discharge   Problem: Elimination: Goal: Will not experience complications related to bowel motility Outcome: Adequate for Discharge Goal: Will not experience complications related to urinary retention Outcome: Adequate for Discharge   Problem: Pain Managment: Goal: General experience of comfort will improve Outcome: Adequate for Discharge   Problem: Safety: Goal: Ability to remain free from injury will improve Outcome: Adequate for Discharge   Problem: Skin Integrity: Goal: Risk for impaired skin integrity will decrease Outcome: Adequate for Discharge   Problem: Education: Goal: Understanding of cardiac disease, CV risk reduction, and recovery process will improve Outcome: Adequate for Discharge Goal: Individualized Educational Video(s) Outcome: Adequate for Discharge   Problem:  Activity: Goal: Ability to tolerate increased activity will improve Outcome: Adequate for Discharge   Problem: Cardiac: Goal: Ability to achieve and maintain adequate cardiovascular perfusion will improve Outcome: Adequate for Discharge   Problem: Health Behavior/Discharge Planning: Goal: Ability to safely manage health-related needs after discharge will improve Outcome: Adequate for Discharge   Problem: Education: Goal: Understanding of CV disease, CV risk reduction, and recovery process will improve Outcome: Adequate for Discharge Goal: Individualized Educational Video(s) Outcome: Adequate for Discharge   Problem: Activity: Goal: Ability to return to baseline activity level will improve Outcome: Adequate for Discharge   Problem: Cardiovascular: Goal: Ability to achieve and maintain adequate cardiovascular perfusion will improve Outcome: Adequate for Discharge Goal: Vascular access site(s) Level 0-1 will be maintained Outcome: Adequate for Discharge   Problem: Health Behavior/Discharge Planning: Goal: Ability to safely manage health-related needs after discharge will improve Outcome: Adequate for Discharge   Problem: Education: Goal: Knowledge of cardiac device and self-care will improve Outcome: Adequate for Discharge Goal: Ability to safely manage health related needs after discharge will improve Outcome: Adequate for Discharge Goal: Individualized Educational Video(s) Outcome: Adequate for Discharge   Problem: Cardiac: Goal: Ability to achieve and maintain adequate cardiopulmonary perfusion will improve Outcome: Adequate for Discharge

## 2022-02-28 NOTE — Telephone Encounter (Signed)
Spoke w/ [name] Early Chars- informed of recommendations. She will discuss w/ provider there. She also wanted it noted that she will be moving Pt to an assisted living facility upon discharge.

## 2022-02-28 NOTE — Telephone Encounter (Signed)
[name] states pt had a heart attack last week and is currently at [facility]. She would like pcp to take a look at her ativan rx as she is having breakout panic attacks.

## 2022-02-28 NOTE — Telephone Encounter (Signed)
Her normal dose of Ativan is 0.5 mg 1 tablet twice daily. My recommendation if they continue with the same. Please be aware that the physician at the facility may like to increase or decrease the dose depending on the situation.

## 2022-02-28 NOTE — Discharge Summary (Cosign Needed)
Discharge Summary    Patient ID: [name] MRN: [id number]; DOB: [birth year]  Admit date: 02/23/2022 Discharge date: 02/28/2022  PCP:  Colon Branch, MD   [place] Prov[facility] Cardiologist:  Shirlee More, MD  Discharge Diagnoses    Principal Problem:   ST elevation myocardial infarction involving right coronary artery [facility]) Active Problems:   Hyperlipidemia with target LDL less than 70   Essential hypertension   Iron deficiency anemia due to chronic blood loss   Chronic diastolic CHF (congestive heart failure) (HCC)   OSA (obstructive sleep apnea)   DVT (deep venous thrombosis) ([place])   Coronary artery disease involving native coronary artery of native heart with unstable angina pectoris ([place])   PE (pulmonary thromboembolism) (HCC)   Paroxysmal atrial fibrillation (HCC)   Dilutional hyponatremia   Presence of drug-eluting stent in right coronary artery  Diagnostic Studies/Procedures    Cath: 02/23/22    Mid LM to Dist LM lesion is 45% stenosed.   Mid LAD lesion is 30% stenosed.   Dist LAD lesion is 30% stenosed.   Ost Cx to Prox Cx lesion is 30% stenosed.   Ramus lesion is 20% stenosed.   Ost RCA to Prox RCA lesion is 95% stenosed.   The left ventricular systolic function is normal.   LV end diastolic pressure is normal.   The left ventricular ejection fraction is greater than 65% by visual estimate.   Single vessel obstructive CAD with severe ostial RCA stenosis with heavy calcification. There is TIMI 3 flow now and patient is pain free Vigorous LV function. Low LVEDP   Plan: discussed with Dr Burt Knack. Patient has complex ostial RCA stenosis. It is heavily calcified. Will require atherectomy to treat adequately. I would recommend aggressive medical therapy to stabilize. Loaded with Brilinta. Will resume IV heparin once TR band is off. Start beta blocker, statin, IV amiodarone for Afib. IV hydration.  Plan for staged PCI of the LCx on Monday with  atherectomy.   Diagnostic Dominance: Right  Cath: 02/26/22  Conclusions: Severe ostial/proximal right coronary artery disease, as detailed in Dr. Doug Sou diagnostic catheterization report from 02/22/2022. Successful PCI to ostial/proximal RCA utilizing orbital atherectomy and intracoronary lithotripsy with placement of a Synergy 4.0 x 16 mm drug-eluting stent with 10% residual stenosis and TIMI-3 flow. Successful placement of 13F temporary transvenous pacing wire via the right femoral vein; the pacing wire was removed at the end of the procedure.   Recommendations: Aggressive secondary prevention of coronary artery disease. Restart IV heparin 2 hours after TR band has been removed and femoral vein hemostasis achieved. Continue ticagrelor and aspirin for now; anticipate transitioning to clopidogrel and apixaban as soon as tomorrow if there is no sign of bleeding/vascular injury.   Nelva Bush, MD [facility]   Diagnostic Dominance: Right  Intervention   _____________   History of Present Illness     [name] is a 88 y.o. female with non-obs CAD 2003 Takotsubo CM w/ EF 40% (60% 2009), CVA, HTN, HLD, IDA, OA, OSA not on CPAP, DVT/PE no on A/C, HFpEF, falls, who was seen 02/23/2022 for the evaluation of STEMI.   The morning of admission she complained of substernal chest pain was present when she woke up.  She rested, but the pain worsened.  She finally called EMS.  Her initial ECG was abnormal but not a STEMI.  However, a later ECG was a STEMI, inferolateral leads.  She took ASA 324 mg prior to EMS arrival.  The pain was a 10/10.  She did not get any nitro by EMS.   The pain was associated with shortness of breath, and nausea.   Her pain eased off and resolved spontaneously.  Her ECG improved after this.   She was taken to the Cath Lab emergently.  Hospital Course     STEMI --Underwent initial cardiac catheterization 10/13 notable for 95% calcified ostial RCA  lesion.  Loaded with Brilinta and continued on IV heparin.  She was brought back for staged atherectomy and shockwave lithotripsy with DES x1 placed.  She was initially placed on DAPT with aspirin/Brilinta but given her episode of atrial fibrillation she was converted to Plavix. -- Continue Plavix, Crestor 20 mg daily, metoprolol 50 mg twice daily, amlodipine 5 mg daily  HFmrEF -- Echocardiogram 10/14 with LVEF of 45 to 50%, basal inferior lateral, anterior lateral and inferior segment hypokinesis, normal RV size mildly increased RV thickness.  Moderate biatrial enlargement -- Continue metoprolol 50 mg daily, amlodipine 5 mg daily, as needed Lasix 20 mg -- Did consider transitioning from amlodipine to ARB but she has a documented allergy  Paroxysmal Afib -- Maintained sinus rhythm during admission -- Converted to oral amiodarone with plans for short load of 400 mg daily through 10/21 followed by 200 mg twice daily for 2 weeks then 100 mg daily maintenance dose -- Started on Eliquis 2.5 mg twice daily -- CHA2DS2-VASc score of 7  HLD -- LDL 74, HDL 47 -- Continued on Crestor 20 mg daily -- Will need LFT/FLP in 8 weeks  Patient was seen by Dr. Ellyn Hack and deemed stable for discharge to skilled nursing facility.  Follow-up arranged in the office.  Medications reviewed with patient and family per Pharm.D. prior to discharge.  Did the patient have an acute coronary syndrome (MI, NSTEMI, STEMI, etc) this admission?:  Yes                               AHA/ACC Clinical Performance & Quality Measures: Aspirin prescribed? - No - stopped at discharge with the need for DOAC ADP Receptor Inhibitor (Plavix/Clopidogrel, Brilinta/Ticagrelor or Effient/Prasugrel) prescribed (includes medically managed patients)? - Yes Beta Blocker prescribed? - Yes High Intensity Statin (Lipitor 40-11m or Crestor 20-426m prescribed? - Yes EF assessed during THIS hospitalization? - Yes For EF <40%, was ACEI/ARB  prescribed? - Not Applicable (EF >/= 4022%For EF <40%, Aldosterone Antagonist (Spironolactone or Eplerenone) prescribed? - Not Applicable (EF >/= 4097%Cardiac Rehab Phase II ordered (including medically managed patients)? - Yes       The patient will be scheduled for a TOC follow up appointment in 10-14 days.  A message has been sent to the [facility] Scheduling Pool at the office where the patient should be seen for follow up.  _____________  Discharge Vitals Blood pressure (!) 152/74, pulse 67, temperature 97.8 F (36.6 C), temperature source Oral, resp. rate (!) 23, weight 57.4 kg, SpO2 97 %.  Filed Weights   02/23/22 1600 02/26/22 0419  Weight: 51.5 kg 57.4 kg   Physical Exam    GEN: Resting quietly in bed, NAD.  Was then up for BM and had some constipation.   Neck: No JVD or carotid bruit. Cardiac: RRR with 1/6 SEM at RUSB.  No other R/G. Respiratory: CTA B, nonlabored, good air movement.  poor effort. GI: Soft/NT/ND/NABS.  No HSM. MS: No C/C/E.  Access sites intact. Neuro:  Nonfocal  Psych: Less anxious today.  Somewhat forgetful.  Hard of hearing.  Labs & Radiologic Studies    CBC Recent Labs    02/27/22 0639 02/28/22 0024  WBC 7.9 5.7  HGB 14.3 13.4  HCT 44.6 42.2  MCV 90.1 89.4  PLT 397 784   Basic Metabolic Panel Recent Labs    02/27/22 0639 02/28/22 0024  NA 131* 133*  K 4.0 3.7  CL 94* 100  CO2 25 28  GLUCOSE 110* 112*  BUN 9 <5*  CREATININE 0.69 0.61  CALCIUM 9.7 9.0   Liver Function Tests No results for input(s): "AST", "ALT", "ALKPHOS", "BILITOT", "PROT", "ALBUMIN" in the last 72 hours. No results for input(s): "LIPASE", "AMYLASE" in the last 72 hours. High Sensitivity Troponin:   Recent Labs  Lab 02/23/22 1507 02/23/22 1741 02/23/22 2005  TROPONINIHS 382* 740* 1,867*    BNP Invalid input(s): "POCBNP" D-Dimer No results for input(s): "DDIMER" in the last 72 hours. Hemoglobin A1C No results for input(s): "HGBA1C" in the last 72  hours. Fasting Lipid Panel No results for input(s): "CHOL", "HDL", "LDLCALC", "TRIG", "CHOLHDL", "LDLDIRECT" in the last 72 hours. Thyroid Function Tests No results for input(s): "TSH", "T4TOTAL", "T3FREE", "THYROIDAB" in the last 72 hours.  Invalid input(s): "FREET3" _____________  CARDIAC CATHETERIZATION  Result Date: 02/26/2022 Conclusions: Severe ostial/proximal right coronary artery disease, as detailed in Dr. Doug Sou diagnostic catheterization report from 02/22/2022. Successful PCI to ostial/proximal RCA utilizing orbital atherectomy and intracoronary lithotripsy with placement of a Synergy 4.0 x 16 mm drug-eluting stent with 10% residual stenosis and TIMI-3 flow. Successful placement of 59F temporary transvenous pacing wire via the right femoral vein; the pacing wire was removed at the end of the procedure. Recommendations: Aggressive secondary prevention of coronary artery disease. Restart IV heparin 2 hours after TR band has been removed and femoral vein hemostasis achieved. Continue ticagrelor and aspirin for now; anticipate transitioning to clopidogrel and apixaban as soon as tomorrow if there is no sign of bleeding/vascular injury. Nelva Bush, MD [facility]  ECHOCARDIOGRAM COMPLETE  Result Date: 02/24/2022    ECHOCARDIOGRAM REPORT   Patient Name:   [name] Date of Exam: 02/24/2022 Medical Rec #:  [id number]         Height:       66.0 in Accession #:    [id number]        Weight:       113.5 lb Date of Birth:  1930/02/04         BSA:          1.572 m Patient Age:    88 years          BP:           147/92 mmHg Patient Gender: F                 HR:           81 bpm. Exam Location:  Inpatient Procedure: 2D Echo, Strain Analysis, Color Doppler and Cardiac Doppler Indications:    NSTEMI  History:        Patient has prior history of Echocardiogram examinations, most                 recent 05/21/2018. CAD.  Sonographer:    Merrie Roof RDCS Referring Phys: [id number] PETER M [place]  IMPRESSIONS  1. Left ventricular ejection fraction, by estimation, is 45 to 50%. The left ventricle has mildly decreased function. The left ventricle demonstrates regional wall motion abnormalities (basal  RCA territory hypokinesis confirmed with regional strain imaging). There is severe asymmetric left ventricular hypertrophy of the basal-septal segment. Left ventricular diastolic parameters are consistent with Grade II diastolic dysfunction (pseudonormalization).  2. Right ventricular systolic function is normal. The right ventricular size is normal. Mildly increased right ventricular wall thickness.  3. Left atrial size was moderately dilated.  4. Right atrial size was moderately dilated.  5. The mitral valve is normal in structure. Trivial mitral valve regurgitation. No evidence of mitral stenosis. The mean mitral valve gradient is 2.0 mmHg with average heart rate of 79 bpm.  6. The aortic valve is calcified. Aortic valve regurgitation is not visualized. Mild aortic valve stenosis. Aortic valve mean gradient measures 10.0 mmHg. Notable decrease in aortic valve excursion, study may understimate stenosis severity. Comparison(s): Prior images reviewed side by side. WMA's are new from 2020. FINDINGS  Left Ventricle: Left ventricular ejection fraction, by estimation, is 45 to 50%. The left ventricle has mildly decreased function. The left ventricle demonstrates regional wall motion abnormalities. The left ventricular internal cavity size was small. There is severe asymmetric left ventricular hypertrophy of the basal-septal segment. Left ventricular diastolic parameters are consistent with Grade II diastolic dysfunction (pseudonormalization).  LV Wall Scoring: The basal inferolateral segment, basal anterolateral segment, basal inferior segment, and basal inferoseptal segment are hypokinetic. Right Ventricle: The right ventricular size is normal. Mildly increased right ventricular wall thickness. Right ventricular  systolic function is normal. Left Atrium: Left atrial size was moderately dilated. Right Atrium: Right atrial size was moderately dilated. Pericardium: There is no evidence of pericardial effusion. Mitral Valve: The mitral valve is normal in structure. Mild to moderate mitral annular calcification. Trivial mitral valve regurgitation. No evidence of mitral valve stenosis. The mean mitral valve gradient is 2.0 mmHg with average heart rate of 79 bpm. Tricuspid Valve: The tricuspid valve is normal in structure. Tricuspid valve regurgitation is not demonstrated. No evidence of tricuspid stenosis. Aortic Valve: The aortic valve is calcified. Aortic valve regurgitation is not visualized. Mild aortic stenosis is present. Aortic valve mean gradient measures 10.0 mmHg. Aortic valve peak gradient measures 16.3 mmHg. Aortic valve area, by VTI measures 1.73 cm. Pulmonic Valve: The pulmonic valve was not well visualized. Pulmonic valve regurgitation is not visualized. Aorta: The aortic root is normal in size and structure. IAS/Shunts: No atrial level shunt detected by color flow Doppler.  LEFT VENTRICLE PLAX 2D LVIDd:         3.70 cm   Diastology LVIDs:         2.60 cm   LV e' medial:    8.38 cm/s LV PW:         1.00 cm   LV E/e' medial:  12.1 LV IVS:        1.00 cm   LV e' lateral:   7.72 cm/s LVOT diam:     2.00 cm   LV E/e' lateral: 13.1 LV SV:         70 LV SV Index:   44 LVOT Area:     3.14 cm  RIGHT VENTRICLE RV Basal diam:  3.90 cm RV S prime:     12.00 cm/s TAPSE (M-mode): 2.2 cm LEFT ATRIUM             Index        RIGHT ATRIUM           Index LA diam:        3.60 cm 2.29 cm/m   RA Area:  23.00 cm LA Vol (A2C):   77.1 ml 49.04 ml/m  RA Volume:   71.50 ml  45.48 ml/m LA Vol (A4C):   62.9 ml 40.01 ml/m LA Biplane Vol: 71.1 ml 45.23 ml/m  AORTIC VALVE AV Area (Vmax):    1.79 cm AV Area (Vmean):   1.60 cm AV Area (VTI):     1.73 cm AV Vmax:           202.00 cm/s AV Vmean:          149.000 cm/s AV VTI:             0.404 m AV Peak Grad:      16.3 mmHg AV Mean Grad:      10.0 mmHg LVOT Vmax:         115.00 cm/s LVOT Vmean:        76.000 cm/s LVOT VTI:          0.222 m LVOT/AV VTI ratio: 0.55 MITRAL VALVE MV Area (PHT): 5.97 cm     SHUNTS MV Mean grad:  2.0 mmHg     Systemic VTI:  0.22 m MV Decel Time: 127 msec     Systemic Diam: 2.00 cm MV E velocity: 101.00 cm/s MV A velocity: 61.80 cm/s MV E/A ratio:  1.63 Rudean Haskell MD Electronically signed by Rudean Haskell MD Signature Date/Time: 02/24/2022/4:27:44 PM    Final    CARDIAC CATHETERIZATION  Result Date: 02/23/2022   Mid LM to Dist LM lesion is 45% stenosed.   Mid LAD lesion is 30% stenosed.   Dist LAD lesion is 30% stenosed.   Ost Cx to Prox Cx lesion is 30% stenosed.   Ramus lesion is 20% stenosed.   Ost RCA to Prox RCA lesion is 95% stenosed.   The left ventricular systolic function is normal.   LV end diastolic pressure is normal.   The left ventricular ejection fraction is greater than 65% by visual estimate. Single vessel obstructive CAD with severe ostial RCA stenosis with heavy calcification. There is TIMI 3 flow now and patient is pain free Vigorous LV function. Low LVEDP Plan: discussed with Dr Burt Knack. Patient has complex ostial RCA stenosis. It is heavily calcified. Will require atherectomy to treat adequately. I would recommend aggressive medical therapy to stabilize. Loaded with Brilinta. Will resume IV heparin once TR band is off. Start beta blocker, statin, IV amiodarone for Afib. IV hydration.  Plan for staged PCI of the LCx on Monday with atherectomy.   DG Thoracic Spine 2 View  Result Date: 02/19/2022 CLINICAL DATA:  Fall.  Rib and back pain. EXAM: THORACIC SPINE 2 VIEWS COMPARISON:  CT thoracic spine 10/01/2021 FINDINGS: As on prior CT, the patient is status post vertebral augmentation at T4, T8, T9, T10, and T12. The mild T7 inferior endplate compression deformity seen on previous CT is progressive in the interval with 50-75% loss  of height at this level on today's study. Mildly accentuated thoracic kyphosis at the T7-9 level. Bones are diffusely demineralized. IMPRESSION: 1. Interval progression of vertebral height loss at T7 compared to prior CT of 10/01/2021. 2. Multiple chronic compression fractures after vertebral augmentation at T4, T8, T9, T10. Electronically Signed   By: Misty Stanley M.D.   On: 02/19/2022 06:00   DG Ribs Unilateral W/Chest Right  Result Date: 02/08/2022 CLINICAL DATA:  Fall, rib pain EXAM: RIGHT RIBS AND CHEST - 3+ VIEW COMPARISON:  10/07/2021 FINDINGS: There are acute minimally displaced fractures of the right third and fourth  ribs posterolaterally. Small right pleural effusion. 3.5 cm ovoid opacity is seen within the right mid lung zone, new since prior examination, indeterminate. No pneumothorax. Cardiac size within normal limits. Pulmonary vascularity is normal. Osseous structures are diffusely osteopenic. Multilevel vertebroplasty has been performed within the thoracolumbar spine. IMPRESSION: Acute minimally displaced fractures of the right third and fourth ribs posterolaterally. Small right pleural effusion. No pneumothorax. New 3.5 cm opacity within the right mid lung zone. Follow-up chest radiograph may be helpful to document resolution. Alternatively, if indicated, this could be further assessed with CT imaging. Electronically Signed   By: Fidela Salisbury M.D.   On: 02/08/2022 01:45   DG Elbow Complete Right  Result Date: 02/08/2022 CLINICAL DATA:  Fall, right elbow pain EXAM: RIGHT ELBOW - COMPLETE 3+ VIEW COMPARISON:  None Available. FINDINGS: There is no evidence of fracture, dislocation, or joint effusion. There is no evidence of arthropathy or other focal bone abnormality. Soft tissues are unremarkable. IMPRESSION: Negative. Electronically Signed   By: Fidela Salisbury M.D.   On: 02/08/2022 01:40   Disposition   Pt is being discharged home today in good condition.  Follow-up Plans &  Appointments     Follow-up Information     Emmaline Life, NP Follow up on 03/09/2022.   Specialty: Nurse Practitioner Why: at 2:20pm for your follow up appt Contact information: [address] [place]  [id number] [phone]                Discharge Instructions     Amb Referral to Cardiac Rehabilitation   Complete by: As directed    Diagnosis: Coronary Stents   After initial evaluation and assessments completed: Virtual Based Care may be provided alone or in conjunction with Phase 2 Cardiac Rehab based on patient barriers.: Yes   Intensive Cardiac Rehabilitation (ICR) [place] location only OR Traditional Cardiac Rehabilitation (TCR) *If criteria for ICR are not met will enroll in TCR [facility] only): Yes   Call MD for:  difficulty breathing, headache or visual disturbances   Complete by: As directed    Call MD for:  persistant dizziness or light-headedness   Complete by: As directed    Call MD for:  redness, tenderness, or signs of infection (pain, swelling, redness, odor or green/yellow discharge around incision site)   Complete by: As directed    Diet - low sodium heart healthy   Complete by: As directed    Discharge instructions   Complete by: As directed    If you notice any bleeding such as blood in stool, black tarry stools, blood in urine, nosebleeds or any other unusual bleeding, call your doctor immediately. It is not normal to have this kind of bleeding while on a blood thinner and usually indicates there is an underlying problem with one of your body systems that needs to be checked out.   Increase activity slowly   Complete by: As directed         Discharge Medications   Allergies as of 02/28/2022       Reactions   Penicillins Hives, Rash, Other (See Comments)   Has patient had a PCN reaction causing immediate rash, facial/tongue/throat swelling, SOB or lightheadedness with hypotension: Yes Has patient had a PCN reaction causing severe rash  involving mucus membranes or skin necrosis: yes Has patient had a PCN reaction that required hospitalization: No Has patient had a PCN reaction occurring within the last 10 years: no If all of the above answers are "  NO", then may proceed with Cephalosporin use. Other Reaction: OTHER REACTION   Valsartan Itching, Other (See Comments)   Atorvastatin Diarrhea   Carvedilol Other (See Comments)   "teeth hurt when I took it"   Ezetimibe Nausea Only   Fluvastatin Sodium Other (See Comments)   "Can't remember"   Magnesium Hydroxide Other (See Comments)   REACTION: triggers HAs   Meloxicam Other (See Comments)   Pt seeing auro's / spots.     Pneumovax [pneumococcal Polysaccharide Vaccine] Other (See Comments)   Local reaction   Quinapril Hcl Other (See Comments)    "feelings of tiredness"   Simvastatin Diarrhea   Topamax [topiramate] Itching   Vit D-vit E-safflower Oil Other (See Comments)   Headaches        Medication List     STOP taking these medications    aspirin EC 81 MG tablet   omeprazole 20 MG capsule Commonly known as: PRILOSEC   oxyCODONE 5 MG immediate release tablet Commonly known as: Roxicodone       TAKE these medications    acetaminophen 325 MG tablet Commonly known as: TYLENOL Take 650 mg by mouth every 6 (six) hours as needed for mild pain.   alendronate 70 MG tablet Commonly known as: FOSAMAX Take 1 tablet (70 mg total) by mouth once a week. Take with a full glass of water on an empty stomach.   amiodarone 400 MG tablet Commonly known as: PACERONE Take 1 tablet (400 mg total) by mouth daily. Take 46m for 2 days then starting 03/03/22 take 2053monce daily Start taking on: March 01, 2022   amLODipine 5 MG tablet Commonly known as: NORVASC Take 1 tablet (5 mg total) by mouth daily.   apixaban 2.5 MG Tabs tablet Commonly known as: ELIQUIS Take 1 tablet (2.5 mg total) by mouth 2 (two) times daily.   clopidogrel 75 MG tablet Commonly known  as: PLAVIX Take 1 tablet (75 mg total) by mouth daily. Start taking on: March 01, 2022   furosemide 20 MG tablet Commonly known as: LASIX Take 1 tablet (20 mg total) by mouth daily as needed for fluid or edema (for weigh gain of 3 pounds or more over one day). What changed:  when to take this reasons to take this   LORazepam 0.5 MG tablet Commonly known as: ATIVAN Take 1 tablet (0.5 mg total) by mouth 2 (two) times daily as needed for anxiety.   metoprolol tartrate 50 MG tablet Commonly known as: LOPRESSOR Take 1 tablet (50 mg total) by mouth 2 (two) times daily.   nitroGLYCERIN 0.4 MG SL tablet Commonly known as: NITROSTAT Place 1 tablet (0.4 mg total) under the tongue every 5 (five) minutes x 3 doses as needed for chest pain.   ondansetron 4 MG tablet Commonly known as: ZOFRAN Take 1 tablet (4 mg total) by mouth every 8 (eight) hours as needed for nausea or vomiting.   pantoprazole 40 MG tablet Commonly known as: PROTONIX Take 1 tablet (40 mg total) by mouth at bedtime.   rosuvastatin 20 MG tablet Commonly known as: CRESTOR Take 1 tablet (20 mg total) by mouth daily. Start taking on: March 01, 2022          Outstanding Labs/Studies   FLP/LFTs in 8 weeks  Duration of Discharge Encounter   Greater than 30 minutes including physician time.  Signed, LiReino BellisNP 02/28/2022, 11:57 AM   I saw and evaluated the patient on the morning of discharge.  See full  progress note for full details.  She was actually doing pretty well.  Major complaint was constipation and abdominal pain.  No chest pain, dyspnea or palpitations..  Her daughter seem to think that she is doing much better today more energetic.  They are very excited to find out that she was approved for and does have a bed available at the skilled nursing facility at [facility].  We are happy to say that she is stable enough for discharge.      Glenetta Hew, MD

## 2022-03-01 ENCOUNTER — Other Ambulatory Visit: Payer: Self-pay | Admitting: *Deleted

## 2022-03-01 DIAGNOSIS — I5032 Chronic diastolic (congestive) heart failure: Secondary | ICD-10-CM | POA: Diagnosis not present

## 2022-03-01 DIAGNOSIS — I2111 ST elevation (STEMI) myocardial infarction involving right coronary artery: Secondary | ICD-10-CM | POA: Diagnosis not present

## 2022-03-01 DIAGNOSIS — Z955 Presence of coronary angioplasty implant and graft: Secondary | ICD-10-CM | POA: Diagnosis not present

## 2022-03-01 DIAGNOSIS — I480 Paroxysmal atrial fibrillation: Secondary | ICD-10-CM | POA: Diagnosis not present

## 2022-03-01 DIAGNOSIS — I10 Essential (primary) hypertension: Secondary | ICD-10-CM | POA: Diagnosis not present

## 2022-03-01 DIAGNOSIS — D500 Iron deficiency anemia secondary to blood loss (chronic): Secondary | ICD-10-CM | POA: Diagnosis not present

## 2022-03-01 DIAGNOSIS — Z48812 Encounter for surgical aftercare following surgery on the circulatory system: Secondary | ICD-10-CM | POA: Diagnosis not present

## 2022-03-01 DIAGNOSIS — Z9181 History of falling: Secondary | ICD-10-CM | POA: Diagnosis not present

## 2022-03-01 DIAGNOSIS — I25110 Atherosclerotic heart disease of native coronary artery with unstable angina pectoris: Secondary | ICD-10-CM | POA: Diagnosis not present

## 2022-03-01 NOTE — Patient Outreach (Signed)
[name] admitted to [facility] on 02/28/22. Screening for potential [facility] care coordination services as benefit of insurance plan and PCP.   Met with Mrs. Diop at beside to discuss [facility] services. Left [facility] literature and writer's contact information for her daughter's review. Mrs. Fedele reports her daughter Early Chars is primary contact.   Will continue to follow.  [name], MSN, RN,BSN [place] Acute Care Coordinator [phone] (Direct dial)

## 2022-03-05 DIAGNOSIS — I10 Essential (primary) hypertension: Secondary | ICD-10-CM | POA: Diagnosis not present

## 2022-03-05 DIAGNOSIS — E785 Hyperlipidemia, unspecified: Secondary | ICD-10-CM | POA: Diagnosis not present

## 2022-03-05 DIAGNOSIS — D500 Iron deficiency anemia secondary to blood loss (chronic): Secondary | ICD-10-CM | POA: Diagnosis not present

## 2022-03-05 DIAGNOSIS — I482 Chronic atrial fibrillation, unspecified: Secondary | ICD-10-CM | POA: Diagnosis not present

## 2022-03-05 DIAGNOSIS — I213 ST elevation (STEMI) myocardial infarction of unspecified site: Secondary | ICD-10-CM | POA: Diagnosis not present

## 2022-03-05 DIAGNOSIS — I480 Paroxysmal atrial fibrillation: Secondary | ICD-10-CM | POA: Diagnosis not present

## 2022-03-05 DIAGNOSIS — Z9181 History of falling: Secondary | ICD-10-CM | POA: Diagnosis not present

## 2022-03-05 DIAGNOSIS — I2111 ST elevation (STEMI) myocardial infarction involving right coronary artery: Secondary | ICD-10-CM | POA: Diagnosis not present

## 2022-03-05 DIAGNOSIS — I5020 Unspecified systolic (congestive) heart failure: Secondary | ICD-10-CM | POA: Diagnosis not present

## 2022-03-05 DIAGNOSIS — I25110 Atherosclerotic heart disease of native coronary artery with unstable angina pectoris: Secondary | ICD-10-CM | POA: Diagnosis not present

## 2022-03-05 DIAGNOSIS — I5032 Chronic diastolic (congestive) heart failure: Secondary | ICD-10-CM | POA: Diagnosis not present

## 2022-03-05 DIAGNOSIS — Z955 Presence of coronary angioplasty implant and graft: Secondary | ICD-10-CM | POA: Diagnosis not present

## 2022-03-05 DIAGNOSIS — Z48812 Encounter for surgical aftercare following surgery on the circulatory system: Secondary | ICD-10-CM | POA: Diagnosis not present

## 2022-03-07 ENCOUNTER — Other Ambulatory Visit: Payer: Self-pay | Admitting: *Deleted

## 2022-03-07 NOTE — Progress Notes (Deleted)
Cardiology Office Note:    Date:  03/07/2022   ID:  [name], DOB 11-22-1929, MRN [id number]  PCP:  Colon Branch, MD   [facility] Providers Cardiologist:  Shirlee More, MD { Click to update primary MD,subspecialty MD or APP then REFRESH:1}    Referring MD: Colon Branch, MD   Chief Complaint: ***  History of Present Illness:    [name] is a *** 88 y.o. female with a hx of CAD, Takotsubo cardiomyopathy 1990 with normal coronary arteriography, repeat coronary angiography 2003 with mild nonobstructive CAD EF at that time 40%, recovered ejection fraction January 2020 60%, stroke, chronic HFpEF, HLD, Raynaud's phenomenon, polycythemia vera iron deficiency anemia, PE  Last cardiology clinic visit was 03/09/2021 with Dr. Bettina Gavia.  She stopped diuretic because of urinary frequency.  She reported living independently, struggling with pain in her hip and frequent falls. Weight loss of 45 lbs in the previous year. She was advised to restart furosemide 20 mg daily and to start Protonix 40 mg daily. Was scheduled for follow-up bmet and BNP 2 weeks after the visit and to return for follow-up in 6 months.   She presented to ED on 02/19/22 via EMS with back pain following a fall 3 days prior at her independent living facility. She was discharged home.  Admission 10/13-10/18/23, presented with substernal chest pain that started when she woke up on the morning of admission.  She rested but the pain worsened, was associated with shortness of breath and nausea. Initial EKG was abnormal but did not reveal STEMI.  However a later EKG revealed STEMI in inferior lateral leads.  She took aspirin prior to EMS arrival, rated pain 10/10.  Did not get any nitroglycerin by EMS.  Pain resolved spontaneously with improvement in EKG following ntg administration.  She was taken emergently to the Cath Lab.  LHC revealed 95% calcified ostial RCA lesion.  She was loaded with Brilinta and continued on IV  heparin.  She returned for staged atherectomy and shockwave lithotripsy with DES x1 on 10/16.  Placed on DAPT with aspirin/Brilinta but subsequently had atrial fibrillation and was converted to Plavix.  Echocardiogram 10/14 revealed LVEF 45 to 50%, basal inferior lateral, anterior lateral and inferior segment hypokinesis, normal RV size, mildly increased RV thickness, moderate biatrial enlargement.  Loaded with amiodarone 4 atrial fibrillation and discharged on 200 mg twice daily for 2 weeks then 100 mg daily.  Started on Eliquis 2.5 mg twice daily  Today, Lipid/LFT  Past Medical History:  Diagnosis Date   CAD (coronary artery disease)    mild nonobstructive disease on cath in 2003   Cancer [facility])    Cardiomyopathy    Probable Takotsubo, severe CP w/ normal cath in 1994. Severe CP in 2003 w/ widespread T wave inversions on ECG. Cath w/ minimal coronary disease but LV-gram showed periapical severe hypokinesis and basilar hyperkinesia (EF 40%). Last echo in 4/09 confirmed full LV functional recovery with EF 60%, no regional wall motion abnormalities, mild to moderate LVH.   Chronic diastolic CHF (congestive heart failure) ([place]) 09/13/2019   CVA (cerebral infarction)    Depression with anxiety 03/29/2011   lost husband 3'09   DVT (deep venous thrombosis) ([place])    after venous ablation, R leg   Fracture 09/10/2007   L2, status post vertebroplasty of L2 performed by IR   GERD (gastroesophageal reflux disease)    Hemorrhoids    Hiatal hernia 03/29/2011   no nerve problems  Hyperlipidemia    Hyperplastic colon polyp 06/2001   Hypertension    Iron deficiency anemia due to chronic blood loss 12/30/2017   OA (osteoarthritis) of knee 03/29/2011   w/ bilateral knee pain-not a problem now   OSA (obstructive sleep apnea) 03/29/2011   no cpap used, not a problem now.   Osteoporosis    Otalgia of both ears    Dr. Simeon Craft   Polycythemia    Stroke [facility]) 03/29/2011   CVA x2 -last 10'12-?TIA(visual  problems)   Varicose vein     Past Surgical History:  Procedure Laterality Date   APPENDECTOMY     BACK SURGERY     BREAST BIOPSY     BREAST CYST EXCISION     BREAST LUMPECTOMY Left 2013   left stage I left breast cancer   CHOLECYSTECTOMY  04/09/2011   Procedure: LAPAROSCOPIC CHOLECYSTECTOMY WITH INTRAOPERATIVE CHOLANGIOGRAM;  Surgeon: Odis Hollingshead, MD;  Location: [facility];  Service: General;  Laterality: N/A;   CORONARY ATHERECTOMY N/A 02/26/2022   Procedure: CORONARY ATHERECTOMY;  Surgeon: Nelva Bush, MD;  Location: [place] CV LAB;  Service: Cardiovascular;  Laterality: N/A;   CORONARY LITHOTRIPSY N/A 02/26/2022   Procedure: CORONARY LITHOTRIPSY;  Surgeon: Nelva Bush, MD;  Location: [place] CV LAB;  Service: Cardiovascular;  Laterality: N/A;   CORONARY STENT INTERVENTION N/A 02/26/2022   Procedure: CORONARY STENT INTERVENTION;  Surgeon: Nelva Bush, MD;  Location: [place] CV LAB;  Service: Cardiovascular;  Laterality: N/A;   Dental Extraction     L maxillary molar   INTRAMEDULLARY (IM) NAIL INTERTROCHANTERIC Left 12/25/2019   Procedure: INTRAMEDULLARY (IM) NAIL INTERTROCHANTRIC;  Surgeon: Paralee Cancel, MD;  Location: [facility];  Service: Orthopedics;  Laterality: Left;   INTRAVASCULAR ULTRASOUND/IVUS N/A 02/26/2022   Procedure: Intravascular Ultrasound/IVUS;  Surgeon: Nelva Bush, MD;  Location: [place] CV LAB;  Service: Cardiovascular;  Laterality: N/A;   IR KYPHO THORACIC WITH BONE BIOPSY  09/15/2019   IR KYPHO THORACIC WITH BONE BIOPSY  07/13/2020       IR KYPHO THORACIC WITH BONE BIOPSY  07/13/2020   IR VERTEBROPLASTY CERV/THOR BX INC UNI/BIL INC/INJECT/IMAGING  11/10/2019   KYPHOPLASTY N/A 11/16/2020   Procedure: T11,  L1,KYPHOPLASTY;  Surgeon: Melina Schools, MD;  Location: [place];  Service: Orthopedics;  Laterality: N/A;   KYPHOSIS SURGERY  08/2007   cement used   LEFT HEART CATH AND CORONARY ANGIOGRAPHY N/A 02/23/2022   Procedure: LEFT HEART  CATH AND CORONARY ANGIOGRAPHY;  Surgeon: Martinique, Peter M, MD;  Location: [place] CV LAB;  Service: Cardiovascular;  Laterality: N/A;   LEFT HEART CATH AND CORONARY ANGIOGRAPHY N/A 02/26/2022   Procedure: LEFT HEART CATH AND CORONARY ANGIOGRAPHY;  Surgeon: Nelva Bush, MD;  Location: [place] CV LAB;  Service: Cardiovascular;  Laterality: N/A;   OVARIAN CYST SURGERY     left   RADIOLOGY WITH ANESTHESIA N/A 11/10/2019   Procedure: VERTEBROPLASTY;  Surgeon: Luanne Bras, MD;  Location: [place];  Service: Radiology;  Laterality: N/A;   TEMPORARY PACEMAKER N/A 02/26/2022   Procedure: TEMPORARY PACEMAKER;  Surgeon: Nelva Bush, MD;  Location: [place] CV LAB;  Service: Cardiovascular;  Laterality: N/A;   TONSILLECTOMY     TOTAL KNEE ARTHROPLASTY  06/2010   left   TUBAL LIGATION      Current Medications: No outpatient medications have been marked as taking for the 03/09/22 encounter (Appointment) with Ann Maki Lanice Schwab, NP.     Allergies:   Penicillins, Valsartan, Atorvastatin, Carvedilol, Ezetimibe, Fluvastatin  sodium, Magnesium hydroxide, Meloxicam, Pneumovax [pneumococcal polysaccharide vaccine], Quinapril hcl, Simvastatin, Topamax [topiramate], and Vit d-vit e-safflower oil   Social History   Socioeconomic History   Marital status: Widowed    Spouse name: Not on file   Number of children: 3   Years of education: Not on file   Highest education level: Not on file  Occupational History   Occupation: n/a  Tobacco Use   Smoking status: Some Days    Packs/day: 1.00    Years: 37.00    Total pack years: 37.00    Types: Cigarettes    Start date: 06/26/1951   Smokeless tobacco: Never   Tobacco comments:    quit 25 years ago  Vaping Use   Vaping Use: Never used  Substance and Sexual Activity   Alcohol use: Not Currently    Alcohol/week: 1.0 standard drink of alcohol    Types: 1 Glasses of wine per week   Drug use: No   Sexual activity: Not Currently  Other  Topics Concern   Not on file  Social History Narrative   Lost husband 07/29/07-    Lives in [place]   has 3 daughters, lost Mickel Baas (03-2017)   Moved to ALF 04-2021   Early Chars lives in [place], [place] ( [place])     Not driving as of [id number]            Social Determinants of Health   Financial Resource Strain: Low Risk  (08/10/2021)   Overall Financial Resource Strain (CARDIA)    Difficulty of Paying Living Expenses: Not hard at all  Food Insecurity: Not on file  Transportation Needs: No Transportation Needs (08/10/2021)   PRAPARE - Hydrologist (Medical): No    Lack of Transportation (Non-Medical): No  Physical Activity: Not on file  Stress: Not on file  Social Connections: Not on file     Family History: The patient's ***family history includes Breast cancer in her mother and sister; Colon cancer in an other family member; Heart disease in her father; Leukemia in her brother.  ROS:   Please see the history of present illness.    *** All other systems reviewed and are negative.  Labs/Other Studies Reviewed:    The following studies were reviewed today:  Coronary atherectomy 02/26/22  Conclusions: Severe ostial/proximal right coronary artery disease, as detailed in Dr. Doug Sou diagnostic catheterization report from 02/22/2022. Successful PCI to ostial/proximal RCA utilizing orbital atherectomy and intracoronary lithotripsy with placement of a Synergy 4.0 x 16 mm drug-eluting stent with 10% residual stenosis and TIMI-3 flow. Successful placement of 48F temporary transvenous pacing wire via the right femoral vein; the pacing wire was removed at the end of the procedure.   Recommendations: Aggressive secondary prevention of coronary artery disease. Restart IV heparin 2 hours after TR band has been removed and femoral vein hemostasis achieved. Continue ticagrelor and aspirin for now; anticipate transitioning to clopidogrel and apixaban as soon as tomorrow  if there is no sign of bleeding/vascular injury.   Diagnostic Dominance: Right  Intervention     LHC 02/23/22    Mid LM to Dist LM lesion is 45% stenosed.   Mid LAD lesion is 30% stenosed.   Dist LAD lesion is 30% stenosed.   Ost Cx to Prox Cx lesion is 30% stenosed.   Ramus lesion is 20% stenosed.   Ost RCA to Prox RCA lesion is 95% stenosed.   The left ventricular systolic function is normal.   LV end  diastolic pressure is normal.   The left ventricular ejection fraction is greater than 65% by visual estimate.   Single vessel obstructive CAD with severe ostial RCA stenosis with heavy calcification. There is TIMI 3 flow now and patient is pain free Vigorous LV function. Low LVEDP   Plan: discussed with Dr Burt Knack. Patient has complex ostial RCA stenosis. It is heavily calcified. Will require atherectomy to treat adequately. I would recommend aggressive medical therapy to stabilize. Loaded with Brilinta. Will resume IV heparin once TR band is off. Start beta blocker, statin, IV amiodarone for Afib. IV hydration.  Plan for staged PCI of the LCx on Monday with atherectomy.     Echo 02/24/22  1. Left ventricular ejection fraction, by estimation, is 45 to 50%. The  left ventricle has mildly decreased function. The left ventricle  demonstrates regional wall motion abnormalities (basal RCA territory  hypokinesis confirmed with regional strain  imaging). There is severe asymmetric left ventricular hypertrophy of the  basal-septal segment. Left ventricular diastolic parameters are consistent  with Grade II diastolic dysfunction (pseudonormalization).   2. Right ventricular systolic function is normal. The right ventricular  size is normal. Mildly increased right ventricular wall thickness.   3. Left atrial size was moderately dilated.   4. Right atrial size was moderately dilated.   5. The mitral valve is normal in structure. Trivial mitral valve  regurgitation. No evidence of  mitral stenosis. The mean mitral valve  gradient is 2.0 mmHg with average heart rate of 79 bpm.   6. The aortic valve is calcified. Aortic valve regurgitation is not  visualized. Mild aortic valve stenosis. Aortic valve mean gradient  measures 10.0 mmHg. Notable decrease in aortic valve excursion, study may  understimate stenosis severity.   Comparison(s): Prior images reviewed side by side. WMA's are new from  2020.   Recent Labs: 03/22/2021: NT-Pro BNP 704 02/23/2022: ALT 8; TSH 1.087 02/25/2022: Magnesium 1.9 02/28/2022: BUN <5; Creatinine, Ser 0.61; Hemoglobin 13.4; Platelets 399; Potassium 3.7; Sodium 133  Recent Lipid Panel    Component Value Date/Time   CHOL 130 02/24/2022 0811   TRIG 43 02/24/2022 0811   HDL 47 02/24/2022 0811   CHOLHDL 2.8 02/24/2022 0811   VLDL 9 02/24/2022 0811   LDLCALC 74 02/24/2022 0811   LDLDIRECT 142.3 06/25/2011 0954     Risk Assessment/Calculations:   {Does this patient have ATRIAL FIBRILLATION?[phone]}       Physical Exam:    VS:  There were no vitals taken for this visit.    Wt Readings from Last 3 Encounters:  02/26/22 126 lb 8.7 oz (57.4 kg)  12/19/21 113 lb 8 oz (51.5 kg)  11/25/21 116 lb 6.5 oz (52.8 kg)     GEN: *** Well nourished, well developed in no acute distress HEENT: Normal NECK: No JVD; No carotid bruits CARDIAC: ***RRR, no murmurs, rubs, gallops RESPIRATORY:  Clear to auscultation without rales, wheezing or rhonchi  ABDOMEN: Soft, non-tender, non-distended MUSCULOSKELETAL:  No edema; No deformity. *** pedal pulses, ***bilaterally SKIN: Warm and dry NEUROLOGIC:  Alert and oriented x 3 PSYCHIATRIC:  Normal affect   EKG:  EKG is *** ordered today.  The ekg ordered today demonstrates ***  No BP recorded.  {Refresh Note OR Click here to enter BP  :1}***    Diagnoses:    No diagnosis found. Assessment and Plan:      CAD    {Are you ordering a CV Procedure (e.g. stress test, cath, DCCV, TEE, etc)?  Press F2        :[id number]}   Disposition:  Medication Adjustments/Labs and Tests Ordered: Current medicines are reviewed at length with the patient today.  Concerns regarding medicines are outlined above.  No orders of the defined types were placed in this encounter.  No orders of the defined types were placed in this encounter.   There are no Patient Instructions on file for this visit.   Signed, Emmaline Life, NP  03/07/2022 3:04 PM    [place]

## 2022-03-07 NOTE — Patient Outreach (Signed)
[place] Coordinator follow up. [name] resides in [facility]. Screening for potential [facility] care coordination services as benefit of insurance plan and PCP.   Facility site visit to [facility]. Attended [facility] staff collaboration meeting. [name] is from home alone. Progressing with therapy. Anticipated transition plan is ALF.  No identifiable [facility] care coordination needs at this time.   Dawn Rolling, MSN, RN,BSN [place] Acute Care Coordinator [phone] (Direct dial)

## 2022-03-08 DIAGNOSIS — Z9181 History of falling: Secondary | ICD-10-CM | POA: Diagnosis not present

## 2022-03-08 DIAGNOSIS — Z48812 Encounter for surgical aftercare following surgery on the circulatory system: Secondary | ICD-10-CM | POA: Diagnosis not present

## 2022-03-08 DIAGNOSIS — I5032 Chronic diastolic (congestive) heart failure: Secondary | ICD-10-CM | POA: Diagnosis not present

## 2022-03-08 DIAGNOSIS — Z955 Presence of coronary angioplasty implant and graft: Secondary | ICD-10-CM | POA: Diagnosis not present

## 2022-03-08 DIAGNOSIS — I480 Paroxysmal atrial fibrillation: Secondary | ICD-10-CM | POA: Diagnosis not present

## 2022-03-08 DIAGNOSIS — I2111 ST elevation (STEMI) myocardial infarction involving right coronary artery: Secondary | ICD-10-CM | POA: Diagnosis not present

## 2022-03-08 DIAGNOSIS — I10 Essential (primary) hypertension: Secondary | ICD-10-CM | POA: Diagnosis not present

## 2022-03-08 DIAGNOSIS — D500 Iron deficiency anemia secondary to blood loss (chronic): Secondary | ICD-10-CM | POA: Diagnosis not present

## 2022-03-08 DIAGNOSIS — I25110 Atherosclerotic heart disease of native coronary artery with unstable angina pectoris: Secondary | ICD-10-CM | POA: Diagnosis not present

## 2022-03-09 ENCOUNTER — Ambulatory Visit: Payer: Medicare Other | Admitting: Nurse Practitioner

## 2022-03-12 DIAGNOSIS — M6281 Muscle weakness (generalized): Secondary | ICD-10-CM | POA: Diagnosis not present

## 2022-03-12 DIAGNOSIS — E785 Hyperlipidemia, unspecified: Secondary | ICD-10-CM | POA: Diagnosis not present

## 2022-03-12 DIAGNOSIS — D500 Iron deficiency anemia secondary to blood loss (chronic): Secondary | ICD-10-CM | POA: Diagnosis not present

## 2022-03-12 DIAGNOSIS — I480 Paroxysmal atrial fibrillation: Secondary | ICD-10-CM | POA: Diagnosis not present

## 2022-03-12 DIAGNOSIS — Z955 Presence of coronary angioplasty implant and graft: Secondary | ICD-10-CM | POA: Diagnosis not present

## 2022-03-12 DIAGNOSIS — I2111 ST elevation (STEMI) myocardial infarction involving right coronary artery: Secondary | ICD-10-CM | POA: Diagnosis not present

## 2022-03-12 DIAGNOSIS — I25110 Atherosclerotic heart disease of native coronary artery with unstable angina pectoris: Secondary | ICD-10-CM | POA: Diagnosis not present

## 2022-03-12 DIAGNOSIS — I69828 Other speech and language deficits following other cerebrovascular disease: Secondary | ICD-10-CM | POA: Diagnosis not present

## 2022-03-12 DIAGNOSIS — R2689 Other abnormalities of gait and mobility: Secondary | ICD-10-CM | POA: Diagnosis not present

## 2022-03-12 DIAGNOSIS — R2681 Unsteadiness on feet: Secondary | ICD-10-CM | POA: Diagnosis not present

## 2022-03-12 DIAGNOSIS — I482 Chronic atrial fibrillation, unspecified: Secondary | ICD-10-CM | POA: Diagnosis not present

## 2022-03-12 DIAGNOSIS — I5032 Chronic diastolic (congestive) heart failure: Secondary | ICD-10-CM | POA: Diagnosis not present

## 2022-03-12 DIAGNOSIS — Z48812 Encounter for surgical aftercare following surgery on the circulatory system: Secondary | ICD-10-CM | POA: Diagnosis not present

## 2022-03-12 DIAGNOSIS — Z9181 History of falling: Secondary | ICD-10-CM | POA: Diagnosis not present

## 2022-03-12 DIAGNOSIS — I5020 Unspecified systolic (congestive) heart failure: Secondary | ICD-10-CM | POA: Diagnosis not present

## 2022-03-12 DIAGNOSIS — I213 ST elevation (STEMI) myocardial infarction of unspecified site: Secondary | ICD-10-CM | POA: Diagnosis not present

## 2022-03-12 DIAGNOSIS — I69891 Dysphagia following other cerebrovascular disease: Secondary | ICD-10-CM | POA: Diagnosis not present

## 2022-03-12 DIAGNOSIS — I10 Essential (primary) hypertension: Secondary | ICD-10-CM | POA: Diagnosis not present

## 2022-03-13 ENCOUNTER — Other Ambulatory Visit: Payer: Self-pay | Admitting: *Deleted

## 2022-03-13 DIAGNOSIS — R2689 Other abnormalities of gait and mobility: Secondary | ICD-10-CM | POA: Diagnosis not present

## 2022-03-13 DIAGNOSIS — I69828 Other speech and language deficits following other cerebrovascular disease: Secondary | ICD-10-CM | POA: Diagnosis not present

## 2022-03-13 DIAGNOSIS — M6281 Muscle weakness (generalized): Secondary | ICD-10-CM | POA: Diagnosis not present

## 2022-03-13 DIAGNOSIS — Z48812 Encounter for surgical aftercare following surgery on the circulatory system: Secondary | ICD-10-CM | POA: Diagnosis not present

## 2022-03-13 DIAGNOSIS — R2681 Unsteadiness on feet: Secondary | ICD-10-CM | POA: Diagnosis not present

## 2022-03-13 DIAGNOSIS — I69891 Dysphagia following other cerebrovascular disease: Secondary | ICD-10-CM | POA: Diagnosis not present

## 2022-03-13 NOTE — Patient Outreach (Signed)
[facility] Post- Acute Care Coordinator follow up. [name] resides in [facility].  Update received from [facility] social worker indicating [name] will transition to [facility] ALF on 03/14/22.  No identifiable [facility] care coordination needs.    [name], MSN, RN,BSN [place] Acute Care Coordinator [phone] (Direct dial)

## 2022-03-15 DIAGNOSIS — I2584 Coronary atherosclerosis due to calcified coronary lesion: Secondary | ICD-10-CM | POA: Diagnosis not present

## 2022-03-15 DIAGNOSIS — I110 Hypertensive heart disease with heart failure: Secondary | ICD-10-CM | POA: Diagnosis not present

## 2022-03-15 DIAGNOSIS — I25119 Atherosclerotic heart disease of native coronary artery with unspecified angina pectoris: Secondary | ICD-10-CM | POA: Diagnosis not present

## 2022-03-15 DIAGNOSIS — I2111 ST elevation (STEMI) myocardial infarction involving right coronary artery: Secondary | ICD-10-CM | POA: Diagnosis not present

## 2022-03-15 DIAGNOSIS — Z48812 Encounter for surgical aftercare following surgery on the circulatory system: Secondary | ICD-10-CM | POA: Diagnosis not present

## 2022-03-15 DIAGNOSIS — I5042 Chronic combined systolic (congestive) and diastolic (congestive) heart failure: Secondary | ICD-10-CM | POA: Diagnosis not present

## 2022-03-19 ENCOUNTER — Telehealth: Payer: Self-pay | Admitting: Internal Medicine

## 2022-03-19 NOTE — Telephone Encounter (Signed)
Patient called to advise that she would like to have her Ativan prescribed increased from 2 a day to 3 a day because by the time the second pill wears off she is feels like she could jump out of her skin and comes unglued. Please call her at [phone]. Patient said that someone would have to call [facility] to advise them to change the dosing. Their number is [phone].

## 2022-03-19 NOTE — Telephone Encounter (Signed)
Patient called stating she has been having a lot more panic attacks and would like to up the dose daily of her ativan. Please advise.

## 2022-03-19 NOTE — Telephone Encounter (Signed)
Please advise 

## 2022-03-19 NOTE — Telephone Encounter (Signed)
Please advise? Pt has appt scheduled 03/21/22.

## 2022-03-19 NOTE — Telephone Encounter (Signed)
Spoke w/ Wannetta Sender- verbal orders given.

## 2022-03-19 NOTE — Telephone Encounter (Signed)
Caller/Agency: Wannetta Sender ([place]) Callback Number: [phone] Requesting OT/PT/Skilled Nursing/Social Work/Speech Therapy: PT Frequency: 2 w 3, 1 w 4

## 2022-03-19 NOTE — Telephone Encounter (Signed)
Appt on Wednesday, will discuss at that time.

## 2022-03-20 IMAGING — CR DG LUMBAR SPINE COMPLETE 4+V
5 series · 5 of 5 positions shown · non-contrast
Comparison: Lumbar MRI 10/27/2016, abdominal CT reformats
07/01/2017

CLINICAL DATA: 89-year-old with back pain. Pain onset this morning.

EXAM:
LUMBAR SPINE - COMPLETE 4+ VIEW

[t lumbar spine ap]
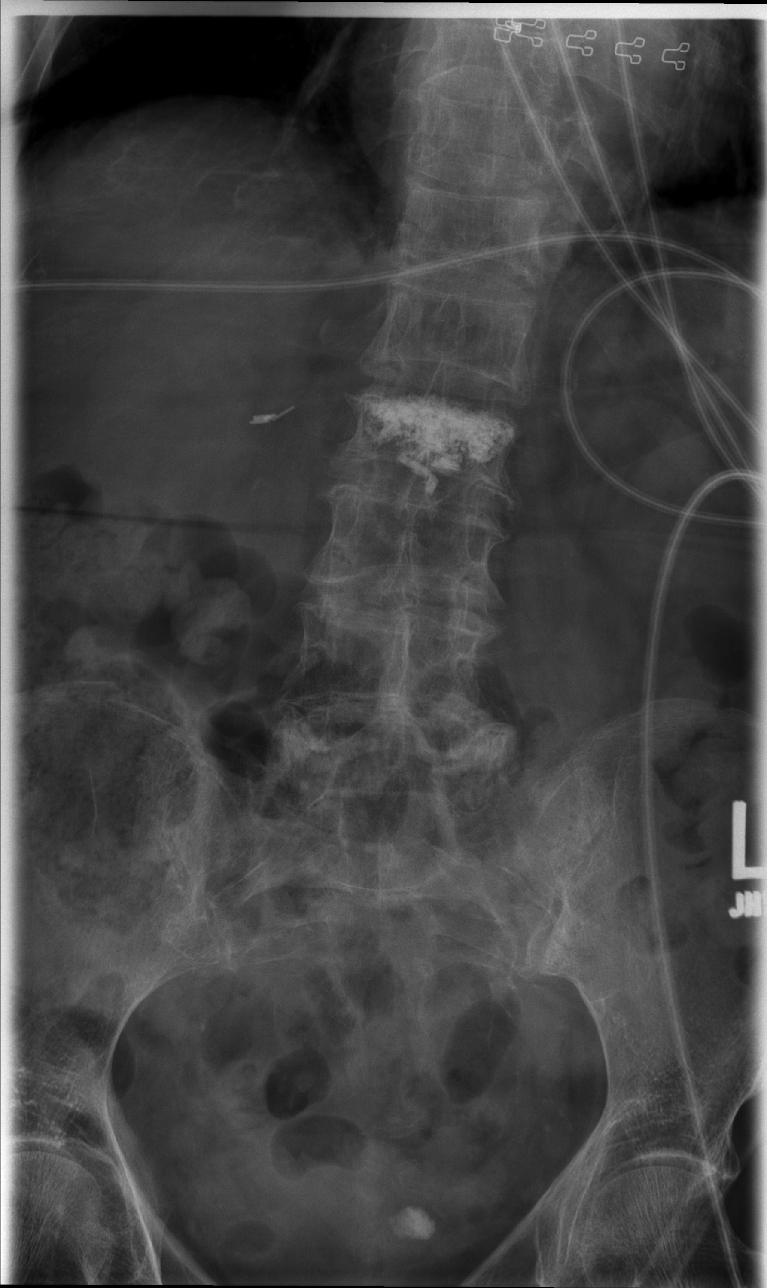

[t lumbar spine obl (1 of 2)]
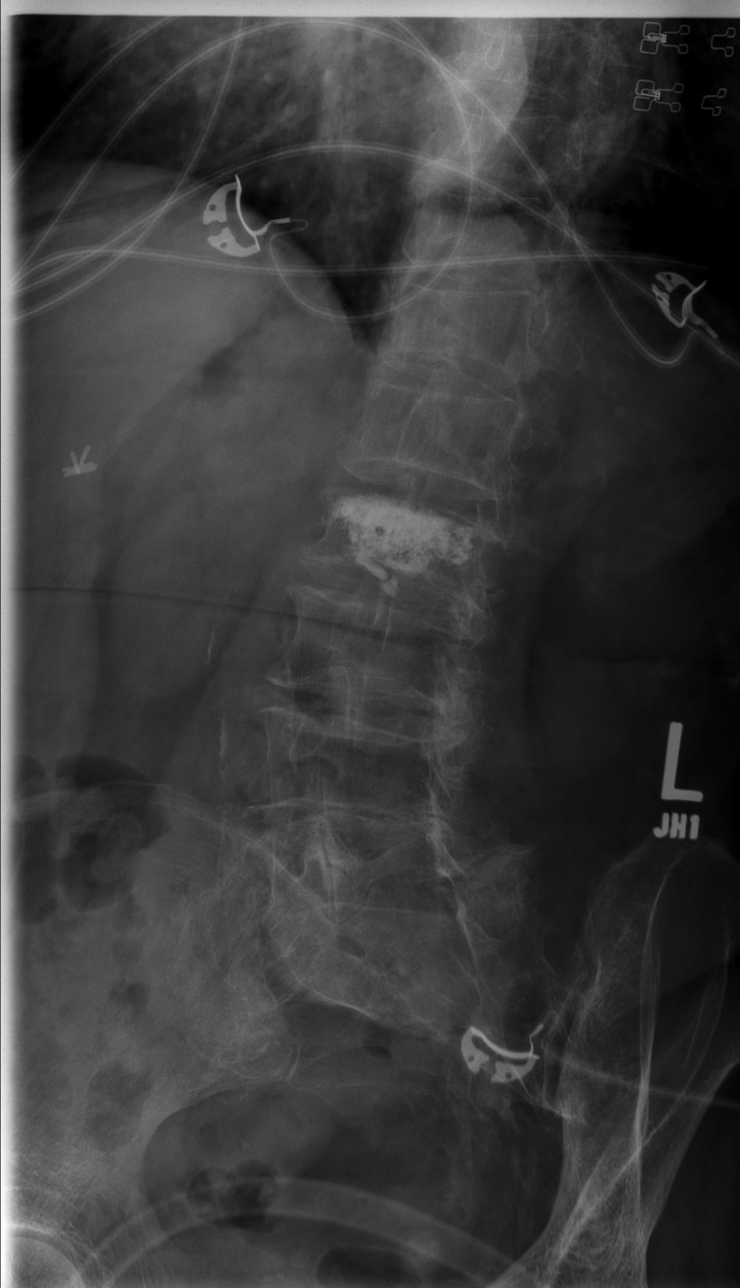

[t lumbar spine obl (2 of 2)]
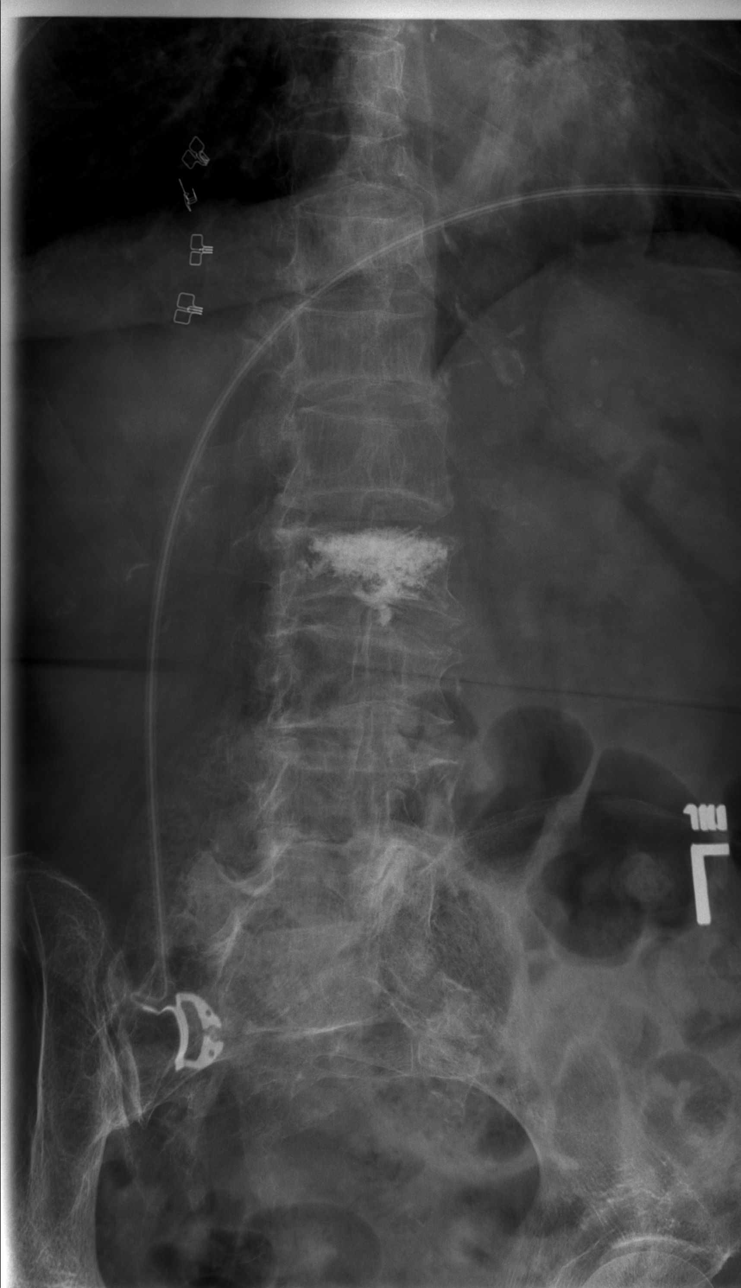

[w lumbar spine lat (1 of 2)]
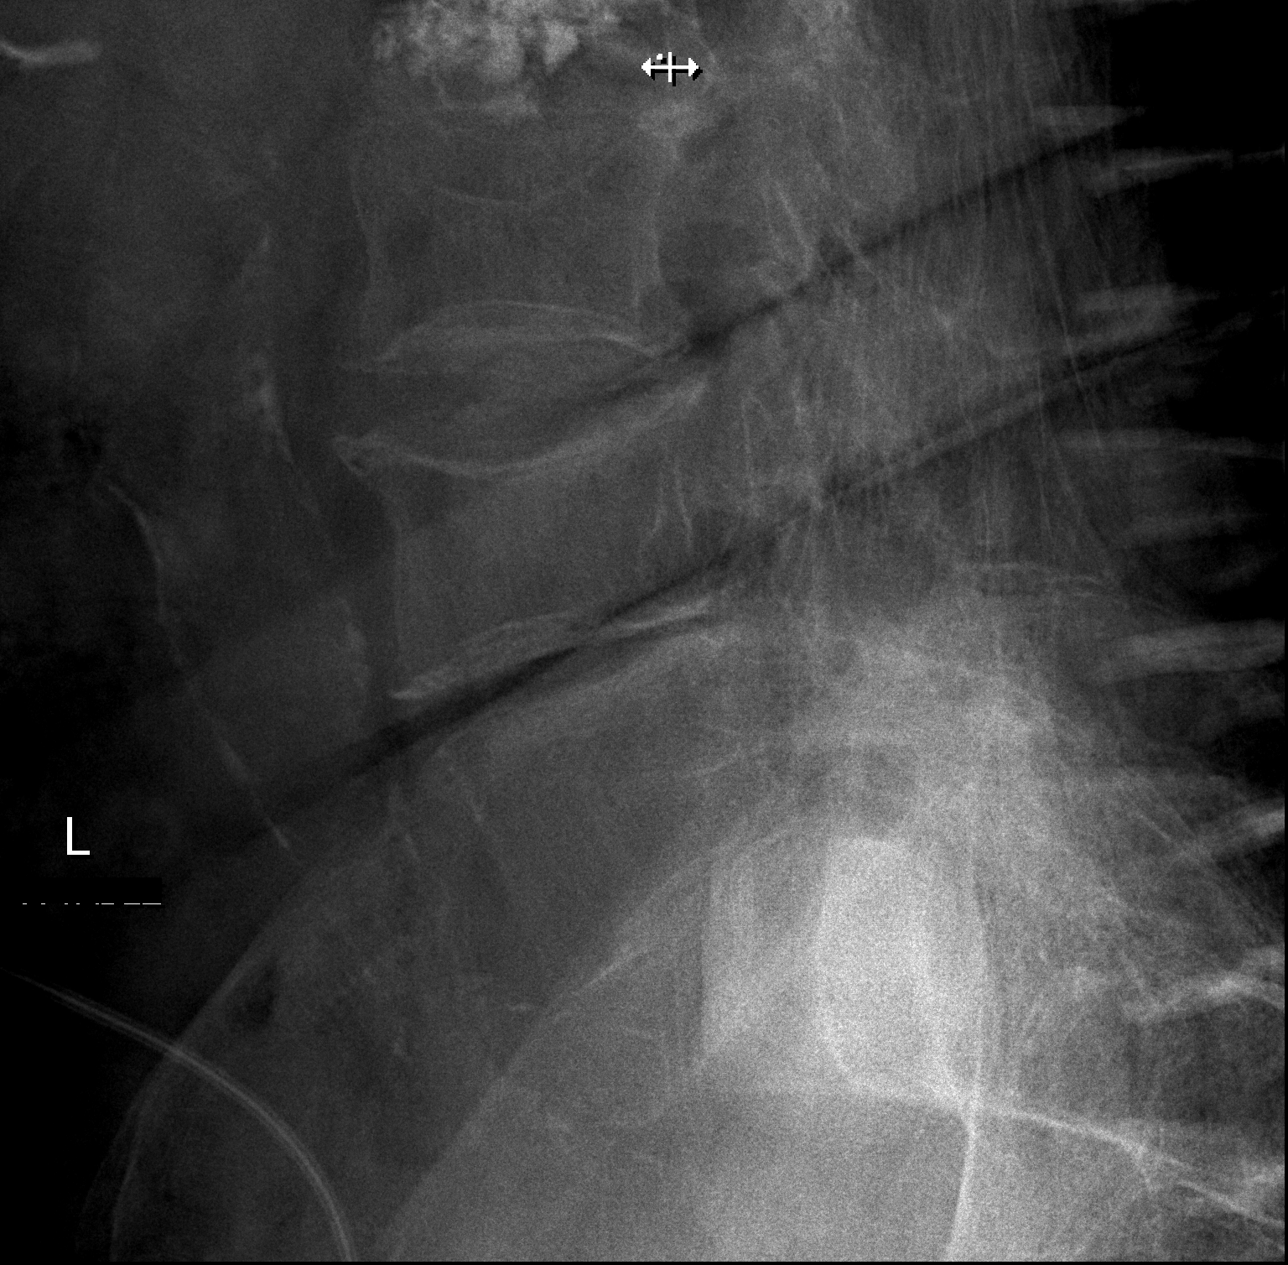

[w lumbar spine lat (2 of 2)]
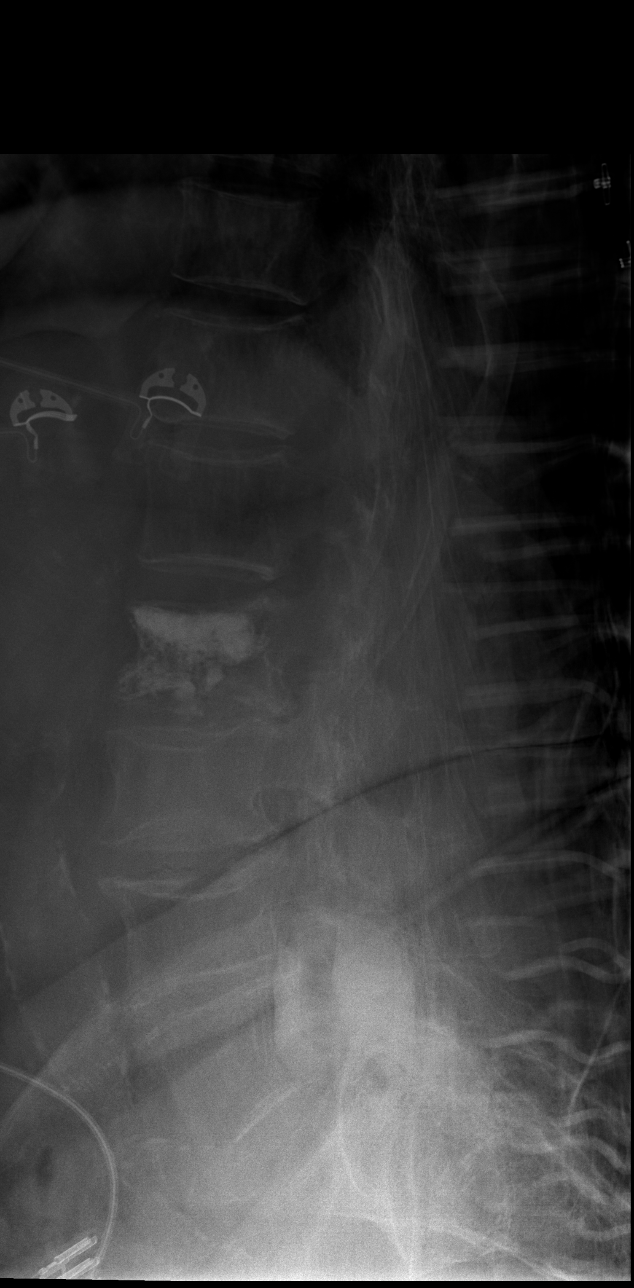

[5 of 5 positions shown; findings below may reference images not displayed]

FINDINGS: Bone significantly under mineralized. L2 compression fracture with
vertebral augmentation. Chronic L4 and L3 compression fractures from
prior imaging. No evidence of acute compression fracture. Multilevel
degenerative disc disease and facet arthropathy.
IMPRESSION: 1. Diffuse osteopenia with multilevel degenerative change. No
evidence of acute abnormality.
2. Chronic L2, L3, and L4 compression fractures, prior vertebral
augmentation at L2.

## 2022-03-20 IMAGING — CR DG THORACIC SPINE 2V
2 series · 2 of 2 positions shown · non-contrast
Comparison: None.

CLINICAL DATA: 89-year-old with back pain.

EXAM:
THORACIC SPINE 2 VIEWS

[t thoracic spine ap]
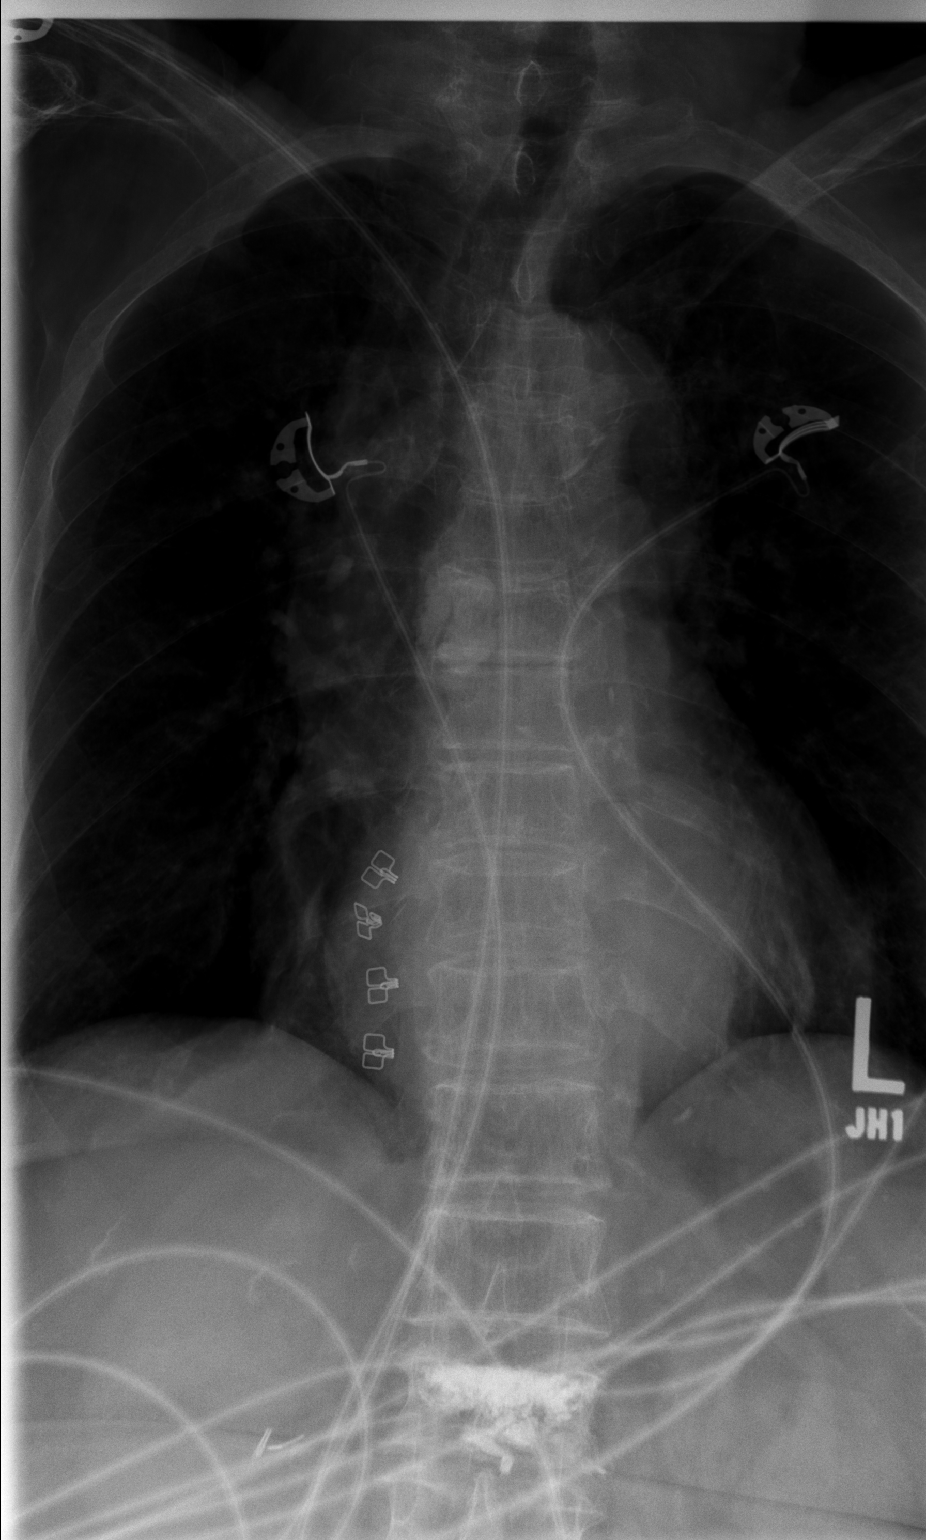

[w thoracic spine lat]
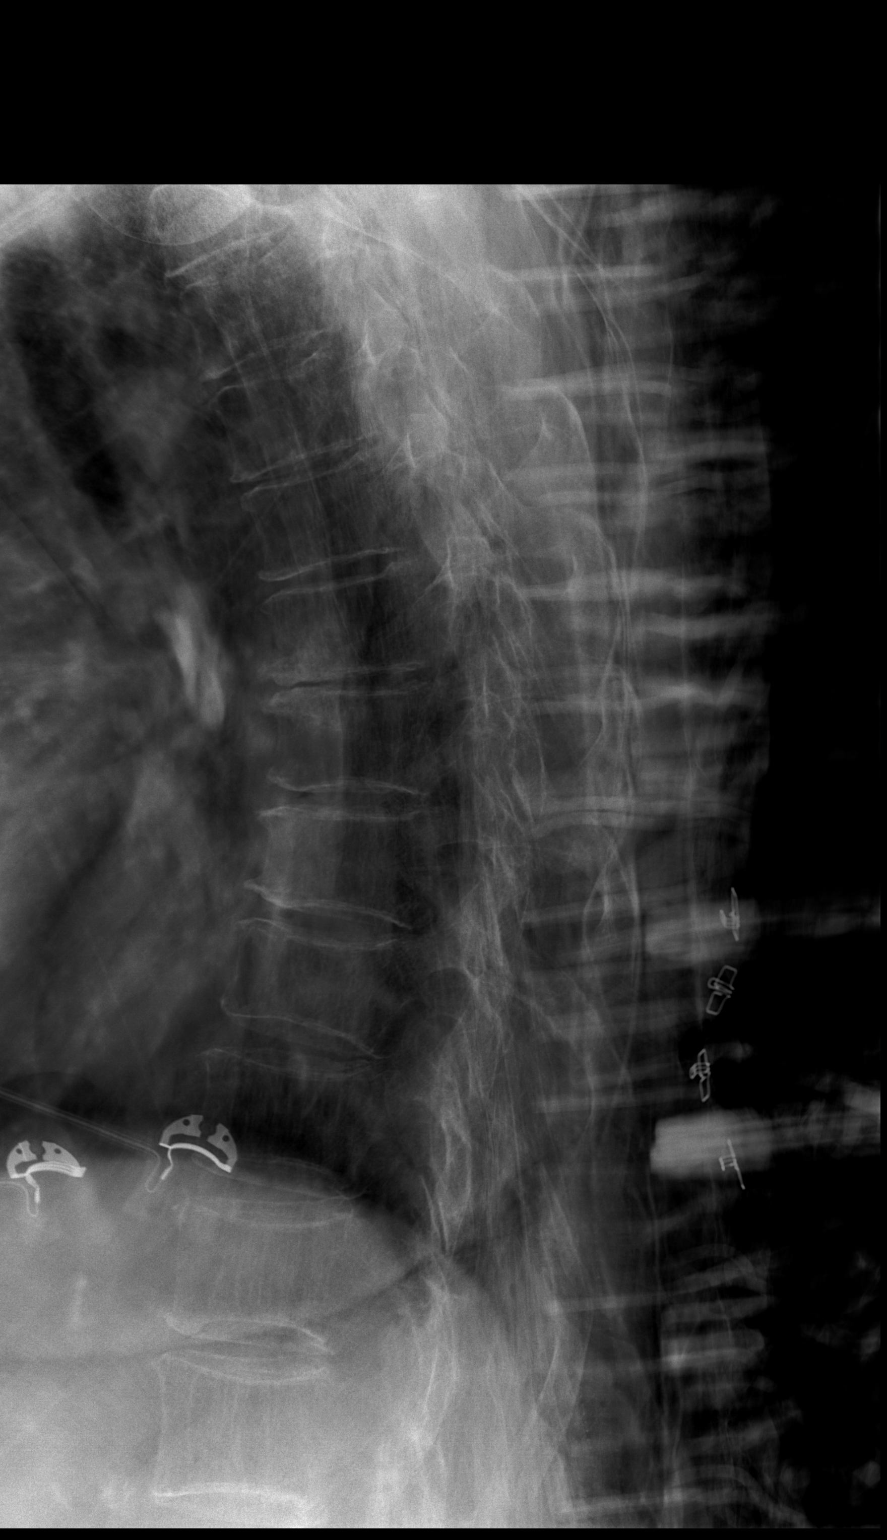

[2 of 2 positions shown; findings below may reference images not displayed]

FINDINGS: Please note there are only 11 rib-bearing thoracic vertebra. Bones
are diffusely under mineralized. No evidence of acute compression
fracture. Mild degenerative disc disease. No paravertebral soft
tissue abnormality to suggest acute fracture.
IMPRESSION: 1. No evidence of acute fracture of the thoracic spine.
2. Diffuse osteopenia/osteoporosis with mild degenerative change.

## 2022-03-20 NOTE — Telephone Encounter (Signed)
Will discuss tomorrow at the time of the office visit

## 2022-03-20 NOTE — Telephone Encounter (Signed)
Noted  

## 2022-03-21 ENCOUNTER — Ambulatory Visit: Payer: Medicare Other | Admitting: Internal Medicine

## 2022-03-21 DIAGNOSIS — E785 Hyperlipidemia, unspecified: Secondary | ICD-10-CM | POA: Diagnosis not present

## 2022-03-21 DIAGNOSIS — I2584 Coronary atherosclerosis due to calcified coronary lesion: Secondary | ICD-10-CM | POA: Diagnosis not present

## 2022-03-21 DIAGNOSIS — Z48812 Encounter for surgical aftercare following surgery on the circulatory system: Secondary | ICD-10-CM | POA: Diagnosis not present

## 2022-03-21 DIAGNOSIS — Z853 Personal history of malignant neoplasm of breast: Secondary | ICD-10-CM | POA: Diagnosis not present

## 2022-03-21 DIAGNOSIS — I2111 ST elevation (STEMI) myocardial infarction involving right coronary artery: Secondary | ICD-10-CM | POA: Diagnosis not present

## 2022-03-21 DIAGNOSIS — I25119 Atherosclerotic heart disease of native coronary artery with unspecified angina pectoris: Secondary | ICD-10-CM | POA: Diagnosis not present

## 2022-03-21 DIAGNOSIS — I480 Paroxysmal atrial fibrillation: Secondary | ICD-10-CM | POA: Diagnosis not present

## 2022-03-21 DIAGNOSIS — I5032 Chronic diastolic (congestive) heart failure: Secondary | ICD-10-CM | POA: Diagnosis not present

## 2022-03-21 DIAGNOSIS — M810 Age-related osteoporosis without current pathological fracture: Secondary | ICD-10-CM | POA: Diagnosis not present

## 2022-03-21 DIAGNOSIS — I5042 Chronic combined systolic (congestive) and diastolic (congestive) heart failure: Secondary | ICD-10-CM | POA: Diagnosis not present

## 2022-03-21 DIAGNOSIS — I110 Hypertensive heart disease with heart failure: Secondary | ICD-10-CM | POA: Diagnosis not present

## 2022-03-21 DIAGNOSIS — K219 Gastro-esophageal reflux disease without esophagitis: Secondary | ICD-10-CM | POA: Diagnosis not present

## 2022-03-23 DIAGNOSIS — I2111 ST elevation (STEMI) myocardial infarction involving right coronary artery: Secondary | ICD-10-CM | POA: Diagnosis not present

## 2022-03-23 DIAGNOSIS — I25119 Atherosclerotic heart disease of native coronary artery with unspecified angina pectoris: Secondary | ICD-10-CM | POA: Diagnosis not present

## 2022-03-23 DIAGNOSIS — I2584 Coronary atherosclerosis due to calcified coronary lesion: Secondary | ICD-10-CM | POA: Diagnosis not present

## 2022-03-23 DIAGNOSIS — Z48812 Encounter for surgical aftercare following surgery on the circulatory system: Secondary | ICD-10-CM | POA: Diagnosis not present

## 2022-03-23 DIAGNOSIS — I110 Hypertensive heart disease with heart failure: Secondary | ICD-10-CM | POA: Diagnosis not present

## 2022-03-23 DIAGNOSIS — I5042 Chronic combined systolic (congestive) and diastolic (congestive) heart failure: Secondary | ICD-10-CM | POA: Diagnosis not present

## 2022-03-26 ENCOUNTER — Other Ambulatory Visit: Payer: Self-pay | Admitting: Nurse Practitioner

## 2022-03-26 DIAGNOSIS — I2584 Coronary atherosclerosis due to calcified coronary lesion: Secondary | ICD-10-CM | POA: Diagnosis not present

## 2022-03-26 DIAGNOSIS — I5042 Chronic combined systolic (congestive) and diastolic (congestive) heart failure: Secondary | ICD-10-CM | POA: Diagnosis not present

## 2022-03-26 DIAGNOSIS — I25119 Atherosclerotic heart disease of native coronary artery with unspecified angina pectoris: Secondary | ICD-10-CM | POA: Diagnosis not present

## 2022-03-26 DIAGNOSIS — I2111 ST elevation (STEMI) myocardial infarction involving right coronary artery: Secondary | ICD-10-CM | POA: Diagnosis not present

## 2022-03-26 DIAGNOSIS — Z48812 Encounter for surgical aftercare following surgery on the circulatory system: Secondary | ICD-10-CM | POA: Diagnosis not present

## 2022-03-26 DIAGNOSIS — I110 Hypertensive heart disease with heart failure: Secondary | ICD-10-CM | POA: Diagnosis not present

## 2022-03-26 DIAGNOSIS — Z1231 Encounter for screening mammogram for malignant neoplasm of breast: Secondary | ICD-10-CM

## 2022-03-29 DIAGNOSIS — I5042 Chronic combined systolic (congestive) and diastolic (congestive) heart failure: Secondary | ICD-10-CM | POA: Diagnosis not present

## 2022-03-29 DIAGNOSIS — I110 Hypertensive heart disease with heart failure: Secondary | ICD-10-CM | POA: Diagnosis not present

## 2022-03-29 DIAGNOSIS — I2111 ST elevation (STEMI) myocardial infarction involving right coronary artery: Secondary | ICD-10-CM | POA: Diagnosis not present

## 2022-03-29 DIAGNOSIS — Z79899 Other long term (current) drug therapy: Secondary | ICD-10-CM | POA: Diagnosis not present

## 2022-03-29 DIAGNOSIS — Z48812 Encounter for surgical aftercare following surgery on the circulatory system: Secondary | ICD-10-CM | POA: Diagnosis not present

## 2022-03-29 DIAGNOSIS — I2584 Coronary atherosclerosis due to calcified coronary lesion: Secondary | ICD-10-CM | POA: Diagnosis not present

## 2022-03-29 DIAGNOSIS — E559 Vitamin D deficiency, unspecified: Secondary | ICD-10-CM | POA: Diagnosis not present

## 2022-03-29 DIAGNOSIS — I25119 Atherosclerotic heart disease of native coronary artery with unspecified angina pectoris: Secondary | ICD-10-CM | POA: Diagnosis not present

## 2022-03-29 IMAGING — CT CT ANGIO CHEST-ABD-PELV FOR DISSECTION W/ AND WO/W CM
2 of 7 series · 13 of 46 positions shown, 15 images · non-contrast
Comparison: [id number] CT abdomen/pelvis. 06/24/2018 chest radiograph.
01/20/2015 chest CT angiogram.

CLINICAL DATA: Left flank pain. Low back pain. Concern for acute
aortic syndrome.

EXAM:
CT ANGIOGRAPHY CHEST, ABDOMEN AND PELVIS
TECHNIQUE: Non-contrast CT of the chest was initially obtained.

[Series 6: axial arterial · axial · arterial · 0.72mm/px · z∈[-601,-70]mm · 10 of 203 slices shown, 12 images]
[im 13/203  soft-tissue]
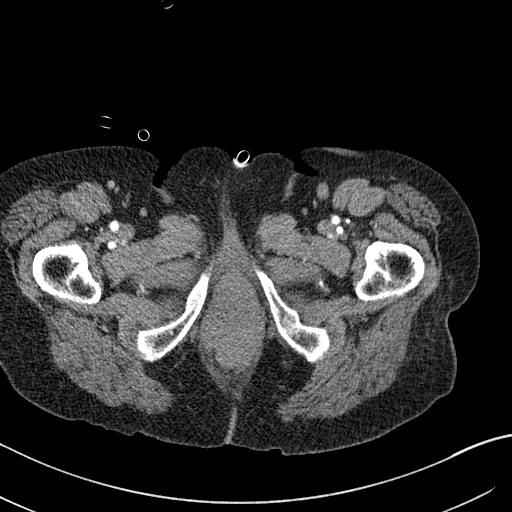
[im 13/203  bone]
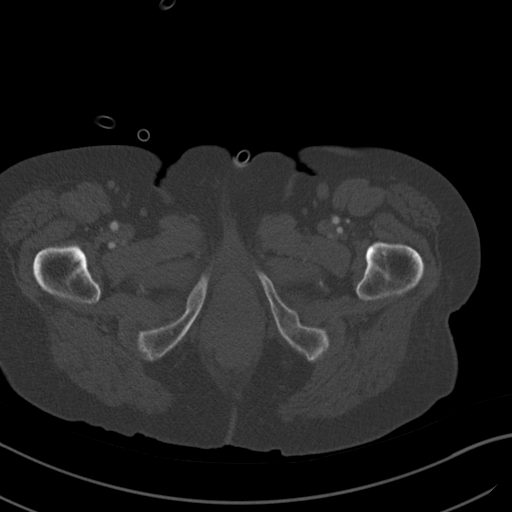
[im 38/203  soft-tissue]
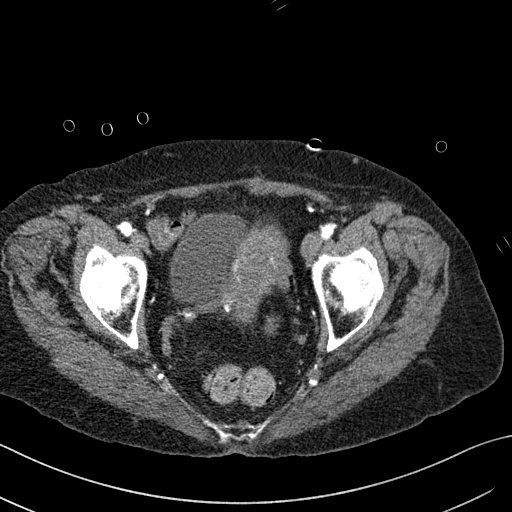
[im 51/203  soft-tissue]
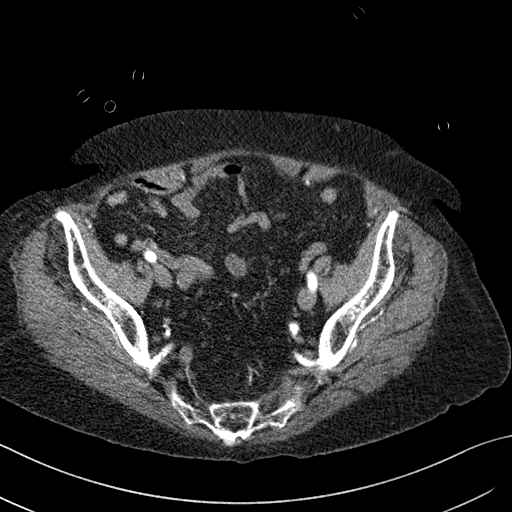
[im 76/203  soft-tissue]
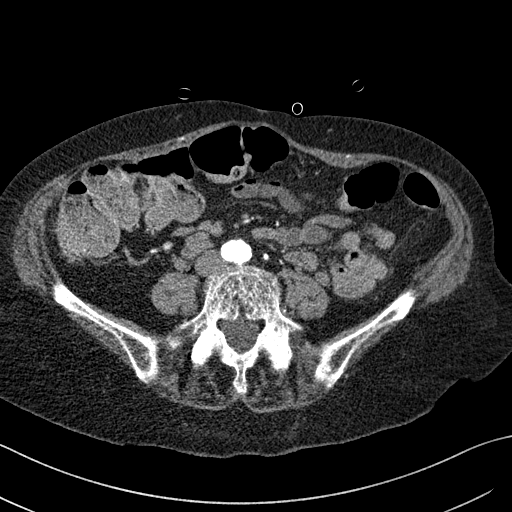
[im 89/203  soft-tissue]
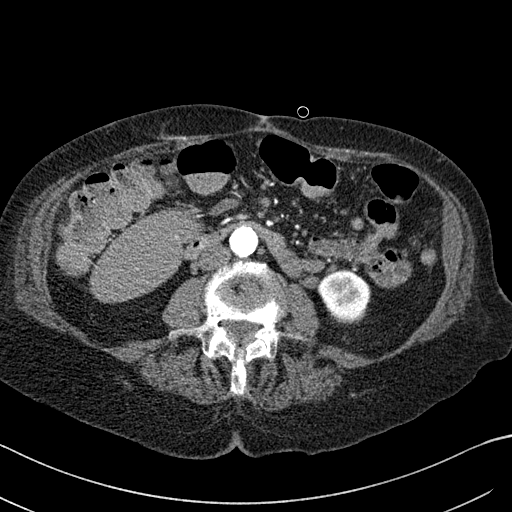
[im 114/203  soft-tissue]
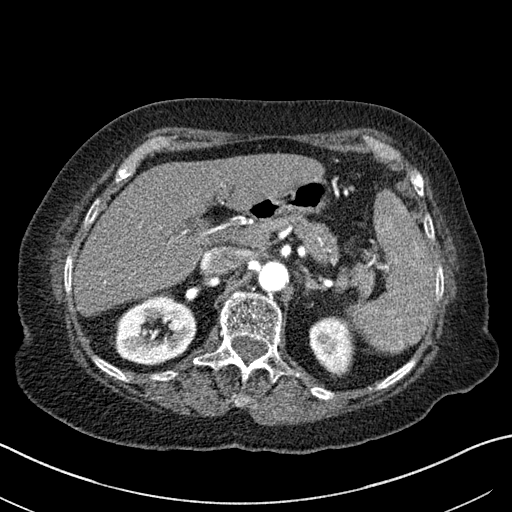
[im 127/203  soft-tissue]
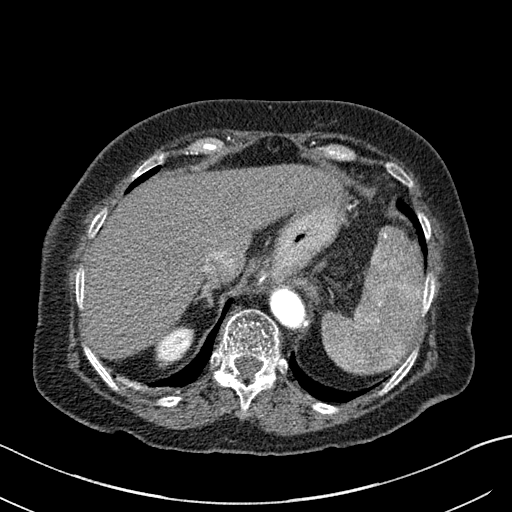
[im 152/203  soft-tissue]
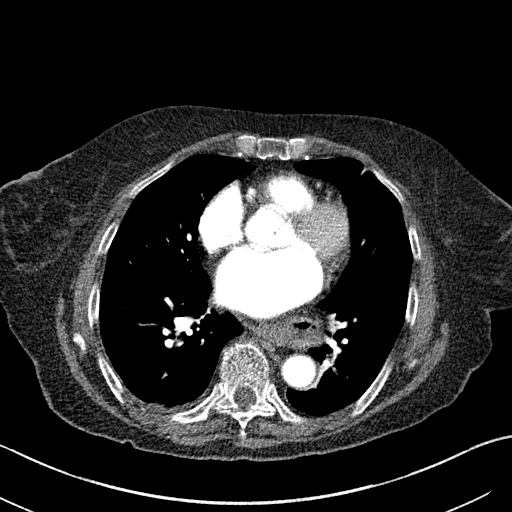
[im 165/203  soft-tissue]
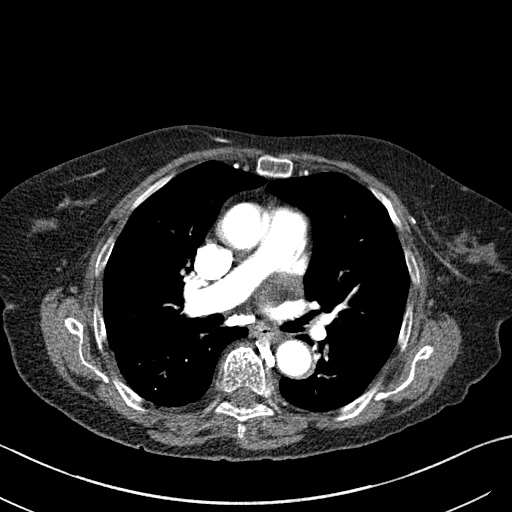
[im 165/203  bone]
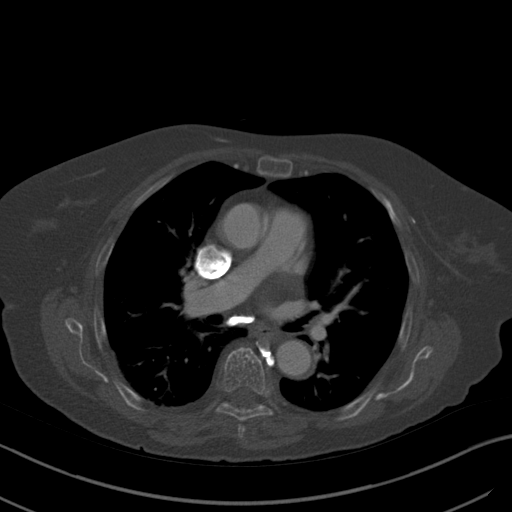
[im 190/203  soft-tissue]
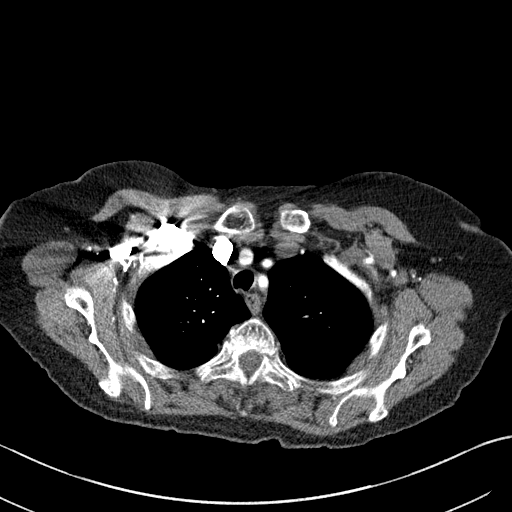

[Series 9: coronals · coronal · 0.79mm/px · 3 of 146 slices shown]
[im 37/146  soft-tissue]
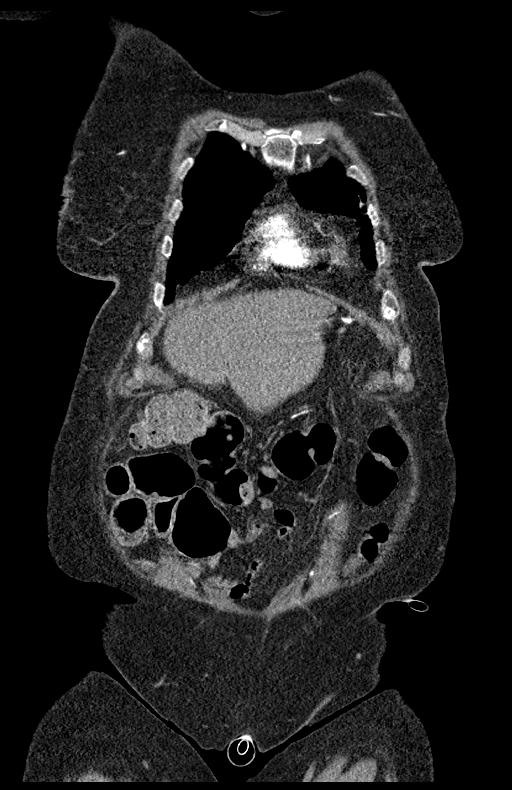
[im 73/146  soft-tissue]
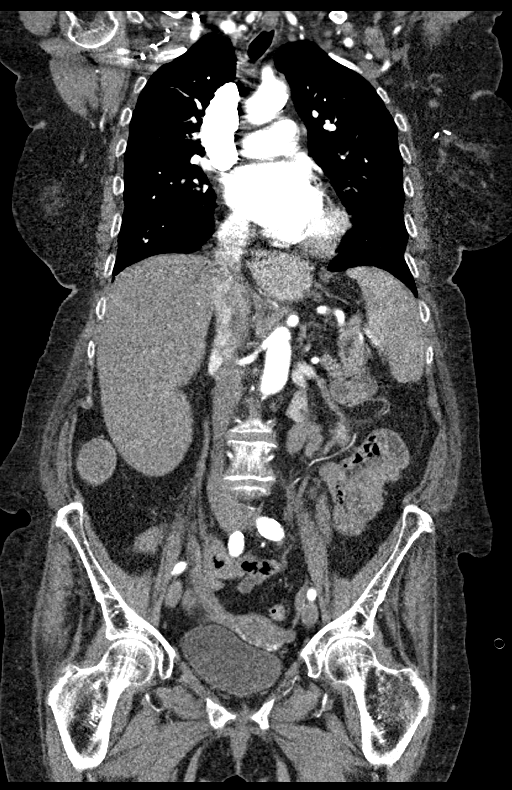
[im 109/146  soft-tissue]
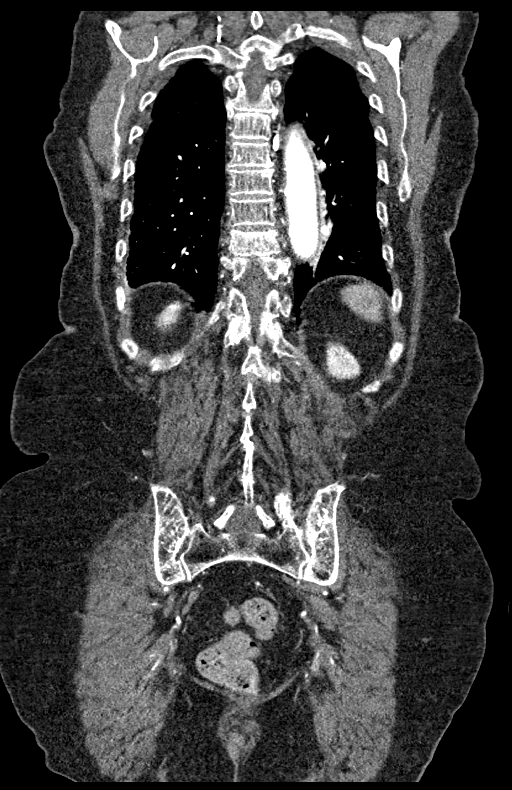

[13 of 46 positions shown; findings below may reference images not displayed]

Multidetector CT imaging through the chest, abdomen and pelvis was
performed using the standard protocol during bolus administration of
intravenous contrast. Multiplanar reconstructed images and MIPs were
obtained and reviewed to evaluate the vascular anatomy.

CONTRAST:  100mL OMNIPAQUE IOHEXOL 350 MG/ML SOLN
FINDINGS: CTA CHEST FINDINGS

Cardiovascular: Top-normal heart size. No significant pericardial
effusion/thickening. Three-vessel coronary atherosclerosis.
Atherosclerotic nonaneurysmal thoracic aorta. No acute intramural
hematoma, dissection, pseudoaneurysm or penetrating atherosclerotic
ulcer in the thoracic aorta. Aortic arch branch vessels are patent.
Normal caliber pulmonary arteries. No central pulmonary emboli.

Mediastinum/Nodes: No discrete thyroid nodules. Unremarkable
esophagus. No pathologically enlarged axillary, mediastinal or hilar
lymph nodes. Coarsely calcified granulomatous subcarinal and right
hilar nodes.

Lungs/Pleura: No pneumothorax. No pleural effusion. Mild compressive
atelectasis in the medial lower lobes from the hiatal hernia. No
acute consolidative airspace disease, lung masses or significant
pulmonary nodules.

Musculoskeletal: No aggressive appearing focal osseous lesions.
Diffuse osteopenia. Mild to moderate T9 vertebral compression
fracture, new since 01/20/2015 CT. Moderate thoracic spondylosis.

Review of the MIP images confirms the above findings.

CTA ABDOMEN AND PELVIS FINDINGS

VASCULAR

Aorta: Atherosclerotic nonaneurysmal abdominal aorta with no
abdominal aortic dissection.

Celiac: Moderate (approximately 50-60%) celiac atherosclerotic
stenosis at the origin.

SMA: Patent without evidence of aneurysm, dissection, vasculitis or
significant stenosis.

Renals: Both renal arteries are patent without evidence of aneurysm,
dissection, vasculitis, fibromuscular dysplasia or significant
stenosis.

IMA: Probable moderate ostial stenosis by atherosclerotic plaque.

Inflow: Patent without evidence of aneurysm, dissection, vasculitis
or significant stenosis.

Veins: No obvious venous abnormality within the limitations of this
arterial phase study.

Review of the MIP images confirms the above findings.

NON-VASCULAR

Hepatobiliary: Normal liver size. Scattered tiny liver granulomatous
calcifications. No liver masses. Cholecystectomy. Bile ducts are
stable and within normal post cholecystectomy limits with CBD
diameter 9 mm.

Pancreas: Normal, with no mass or duct dilation.

Spleen: Normal size spleen. Scattered granulomatous splenic
calcifications. No splenic masses.

Adrenals/Urinary Tract: Normal adrenals. No renal stones. No
hydronephrosis. No contour deforming renal masses. Normal bladder.

Stomach/Bowel: Moderate hiatal hernia. Otherwise normal nondistended
stomach. Normal caliber small bowel with no small bowel wall
thickening. Appendectomy. Mild sigmoid diverticulosis with no large
bowel wall thickening or significant pericolonic fat stranding.

Vascular/Lymphatic: No pathologically enlarged lymph nodes in the
abdomen or pelvis.

Reproductive: Stable coarsely calcified 1.3 cm left uterine fibroid.
No adnexal masses.

Other: No pneumoperitoneum, ascites or focal fluid collection.

Musculoskeletal: No aggressive appearing focal osseous lesions.
Chronic moderate L2 vertebral compression fracture status post
vertebroplasty. Chronic moderate L3 and L4 vertebral compression
fractures are unchanged. Diffuse osteopenia. Moderate lumbar
spondylosis.

Review of the MIP images confirms the above findings.
IMPRESSION: 1. No acute aortic syndrome.
2. Mild to moderate T9 vertebral compression fracture, new since
01/20/2015 CT, of uncertain chronicity, possibly acute. Chronic L2,
L3 and L4 vertebral compression fractures.
3. Three-vessel coronary atherosclerosis.
4. Moderate hiatal hernia.
5. Mild sigmoid diverticulosis.
6. Aortic Atherosclerosis ([id number]). Additional chronic findings
as detailed.

## 2022-03-30 DIAGNOSIS — I25119 Atherosclerotic heart disease of native coronary artery with unspecified angina pectoris: Secondary | ICD-10-CM | POA: Diagnosis not present

## 2022-03-30 DIAGNOSIS — I2584 Coronary atherosclerosis due to calcified coronary lesion: Secondary | ICD-10-CM | POA: Diagnosis not present

## 2022-03-30 DIAGNOSIS — I5042 Chronic combined systolic (congestive) and diastolic (congestive) heart failure: Secondary | ICD-10-CM | POA: Diagnosis not present

## 2022-03-30 DIAGNOSIS — I2111 ST elevation (STEMI) myocardial infarction involving right coronary artery: Secondary | ICD-10-CM | POA: Diagnosis not present

## 2022-03-30 DIAGNOSIS — Z48812 Encounter for surgical aftercare following surgery on the circulatory system: Secondary | ICD-10-CM | POA: Diagnosis not present

## 2022-03-30 DIAGNOSIS — I110 Hypertensive heart disease with heart failure: Secondary | ICD-10-CM | POA: Diagnosis not present

## 2022-03-30 IMAGING — MR MR THORACIC SPINE W/O CM
6 series · 38 of 48 positions shown · non-contrast
Comparison: CTA chest, abdomen, and pelvis 09/13/2019

CLINICAL DATA: Fall. T9 compression fracture.

EXAM:
MRI THORACIC SPINE WITHOUT CONTRAST
TECHNIQUE: Multiplanar, multisequence MR imaging of the thoracic spine was
performed. No intravenous contrast was administered.

[Series 16: T1 · sagittal · 4.0mm · 1.72mm/px · 5 of 15 slices shown (1 of 2)]
[im 1/15]
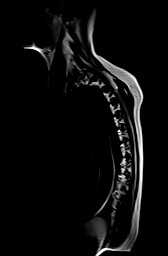
[im 4/15]
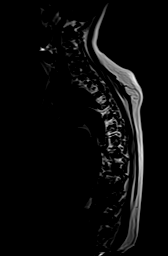
[im 8/15]
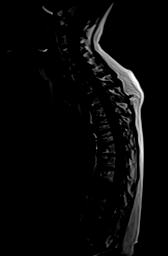
[im 11/15]
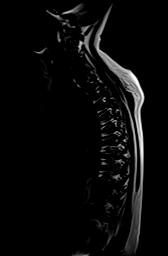
[im 15/15]
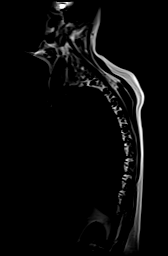

[Series 17: STIR · sagittal · 3.0mm · 1.00mm/px · 6 of 17 slices shown]
[im 1/17]
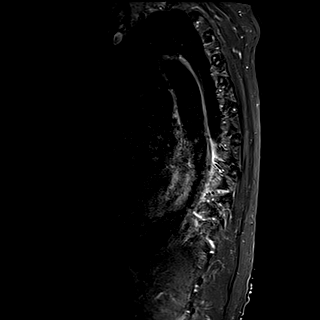
[im 4/17]
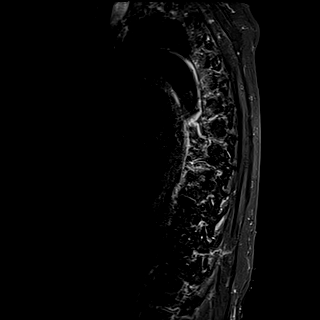
[im 7/17]
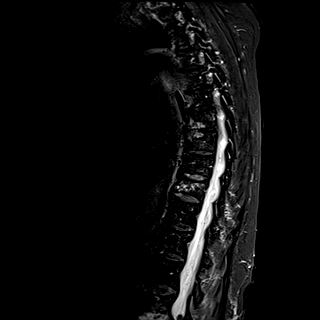
[im 10/17]
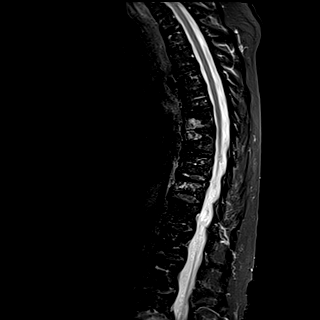
[im 13/17]
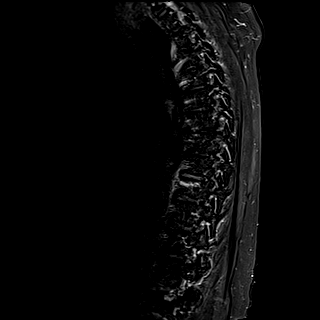
[im 17/17]
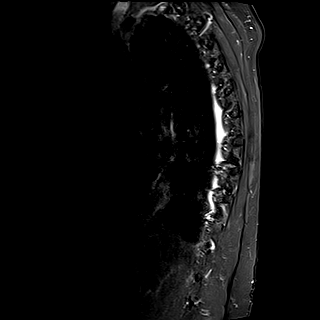

[Series 18: T1 · sagittal · 3.0mm · 1.00mm/px · 5 of 15 slices shown (2 of 2)]
[im 1/15]
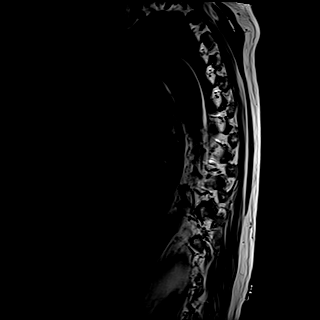
[im 4/15]
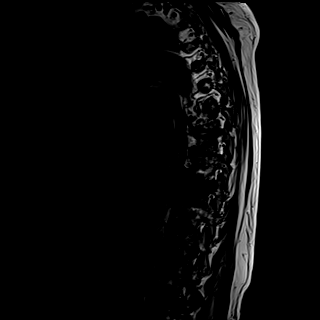
[im 8/15]
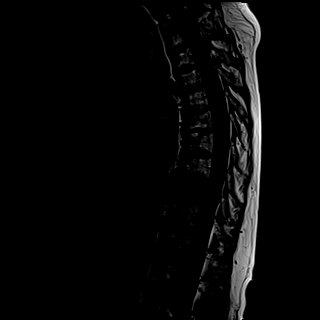
[im 11/15]
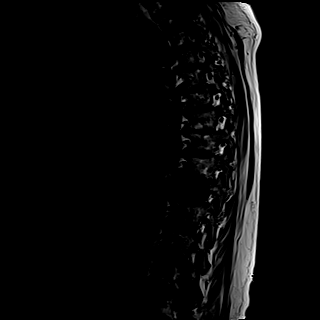
[im 15/15]
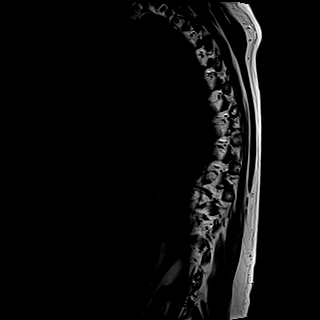

[Series 19: T2 · sagittal · 3.0mm · 0.83mm/px · 6 of 17 slices shown (1 of 2)]
[im 1/17]
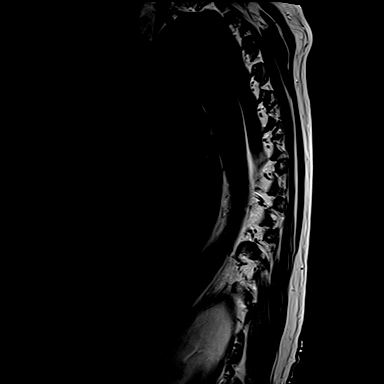
[im 4/17]
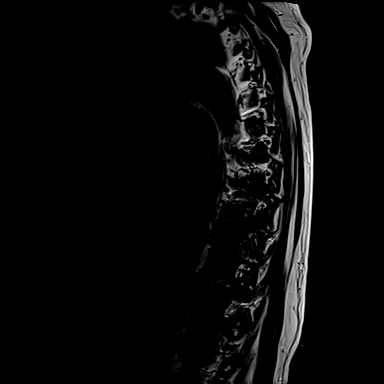
[im 7/17]
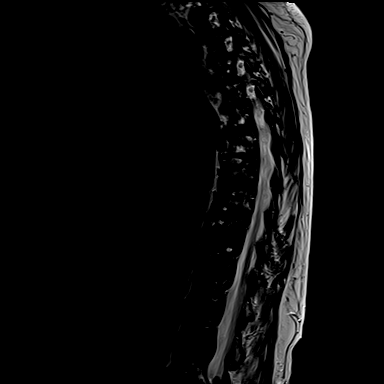
[im 10/17]
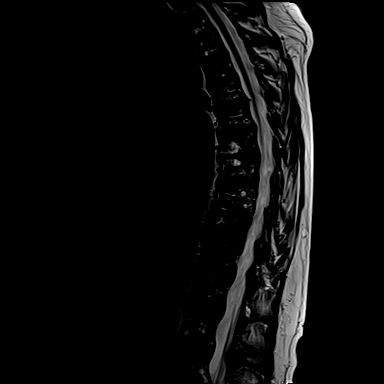
[im 13/17]
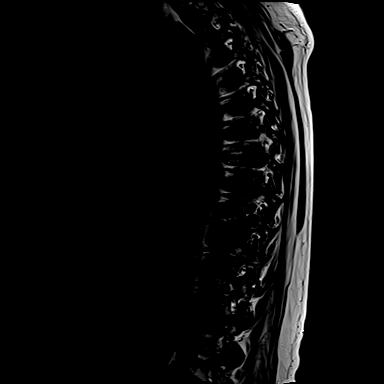
[im 17/17]
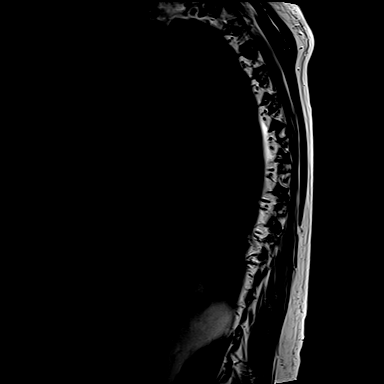

[Series 20: T2 · axial · 4.0mm · 0.78mm/px · z∈[-291,-97]mm · 9 of 39 slices shown (2 of 2)]
[im 1/39]
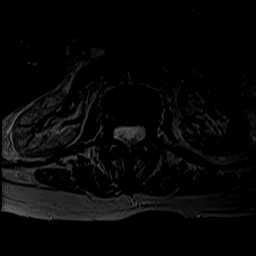
[im 7/39]
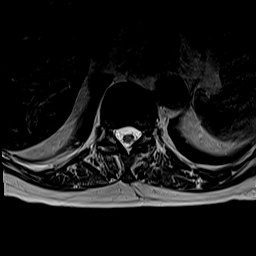
[im 13/39]
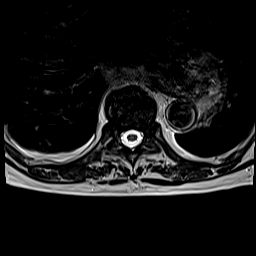
[im 16/39]
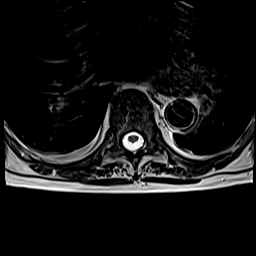
[im 20/39]
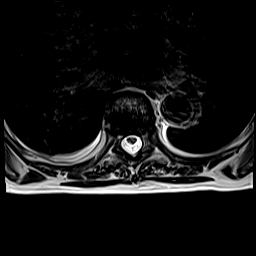
[im 23/39]
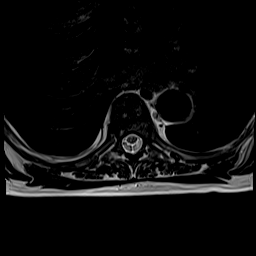
[im 26/39]
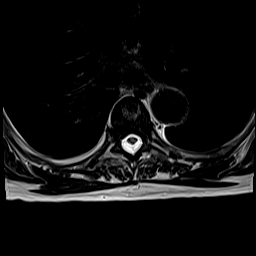
[im 32/39]
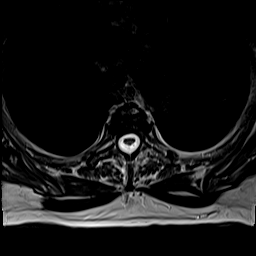
[im 39/39]
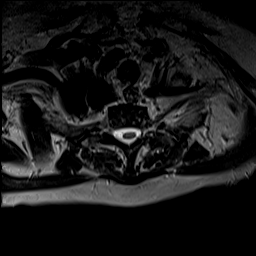

[Series 21: t2_me2d_tra · axial · 4.0mm · 0.39mm/px · z∈[-291,-114]mm · 7 of 39 slices shown]
[im 1/39]
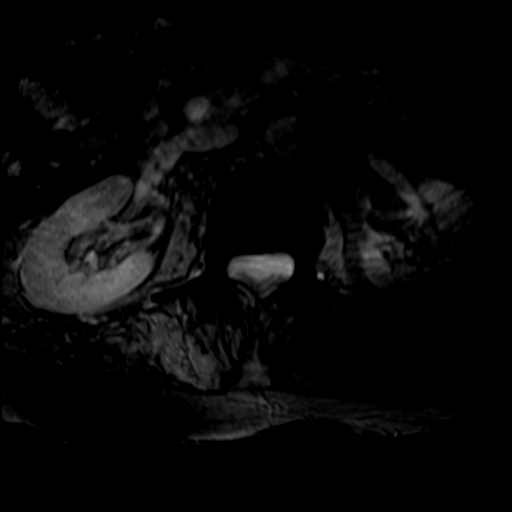
[im 7/39]
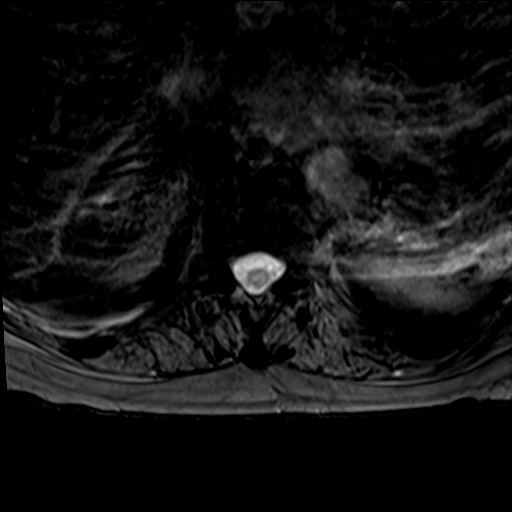
[im 13/39]
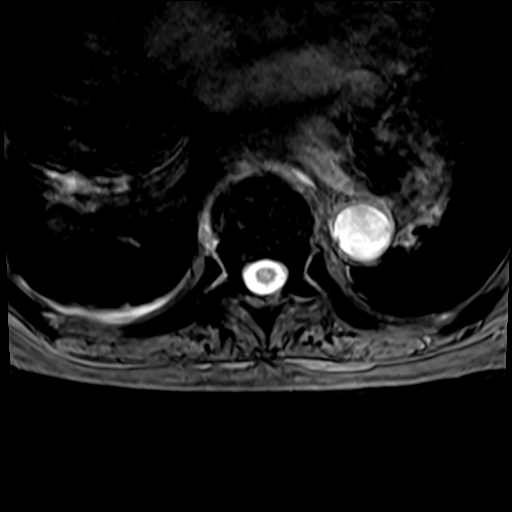
[im 16/39]
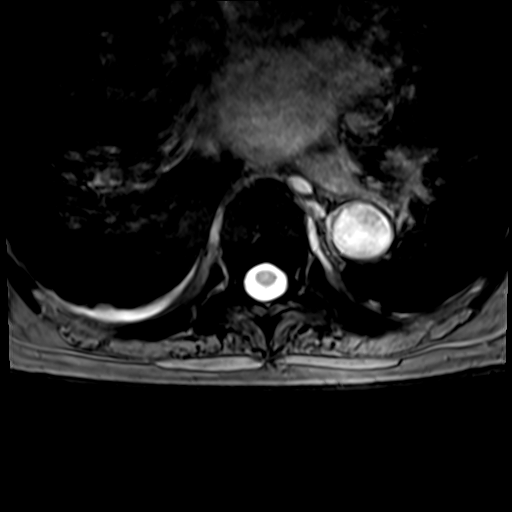
[im 23/39]
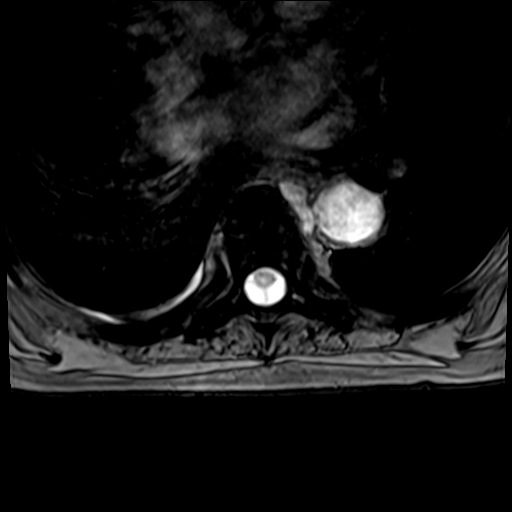
[im 26/39]
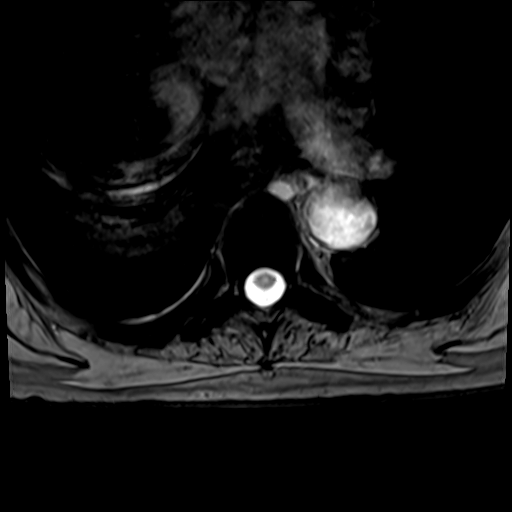
[im 32/39]
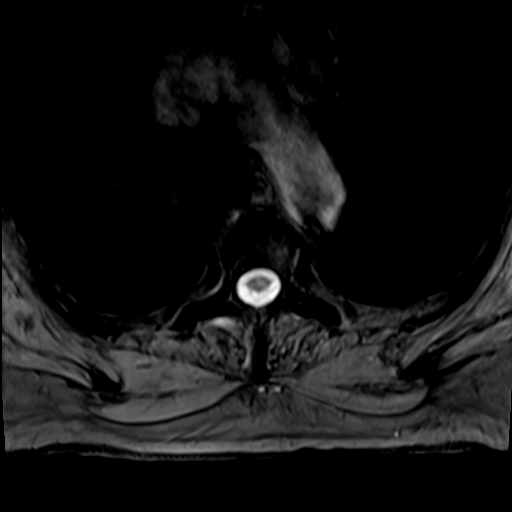

[38 of 48 positions shown; findings below may reference images not displayed]

FINDINGS: Alignment:  Normal.

Vertebrae: T9 compression fracture with extensive marrow edema and
30% vertebral body height loss without retropulsion. Moderate marrow
edema in the opposing T6 and T7 vertebral bodies without height
loss, favored to be degenerative with Modic type 2 degenerative
endplate changes also present at this level. Remote L1 compression
fracture status post augmentation. Nonspecific diffuse bone marrow
heterogeneity.

Cord:  Normal signal and morphology.

Paraspinal and other soft tissues: Moderately large sliding hiatal
hernia. Trace right pleural effusion.

Disc levels:

Mild disc bulging primarily in the mid to upper thoracic spine and
mild thoracic facet arthrosis without spinal stenosis or spinal cord
mass effect. Mild right and moderate left neural foraminal stenosis
at T9-10.
IMPRESSION: 1. Acute T9 compression fracture with 30% height loss.
2. Mild thoracic spondylosis and facet arthrosis without spinal
stenosis.

## 2022-04-02 DIAGNOSIS — I5042 Chronic combined systolic (congestive) and diastolic (congestive) heart failure: Secondary | ICD-10-CM | POA: Diagnosis not present

## 2022-04-02 DIAGNOSIS — I2111 ST elevation (STEMI) myocardial infarction involving right coronary artery: Secondary | ICD-10-CM | POA: Diagnosis not present

## 2022-04-02 DIAGNOSIS — I25119 Atherosclerotic heart disease of native coronary artery with unspecified angina pectoris: Secondary | ICD-10-CM | POA: Diagnosis not present

## 2022-04-02 DIAGNOSIS — I2584 Coronary atherosclerosis due to calcified coronary lesion: Secondary | ICD-10-CM | POA: Diagnosis not present

## 2022-04-02 DIAGNOSIS — Z48812 Encounter for surgical aftercare following surgery on the circulatory system: Secondary | ICD-10-CM | POA: Diagnosis not present

## 2022-04-02 DIAGNOSIS — I110 Hypertensive heart disease with heart failure: Secondary | ICD-10-CM | POA: Diagnosis not present

## 2022-04-04 ENCOUNTER — Telehealth: Payer: Self-pay

## 2022-04-04 NOTE — Telephone Encounter (Signed)
Plan of care signed and faxed to [facility] at [phone]. Form sent for scanning.

## 2022-04-10 DIAGNOSIS — I2111 ST elevation (STEMI) myocardial infarction involving right coronary artery: Secondary | ICD-10-CM | POA: Diagnosis not present

## 2022-04-10 DIAGNOSIS — Z48812 Encounter for surgical aftercare following surgery on the circulatory system: Secondary | ICD-10-CM | POA: Diagnosis not present

## 2022-04-10 DIAGNOSIS — I25119 Atherosclerotic heart disease of native coronary artery with unspecified angina pectoris: Secondary | ICD-10-CM | POA: Diagnosis not present

## 2022-04-10 DIAGNOSIS — I5042 Chronic combined systolic (congestive) and diastolic (congestive) heart failure: Secondary | ICD-10-CM | POA: Diagnosis not present

## 2022-04-10 DIAGNOSIS — I110 Hypertensive heart disease with heart failure: Secondary | ICD-10-CM | POA: Diagnosis not present

## 2022-04-10 DIAGNOSIS — I2584 Coronary atherosclerosis due to calcified coronary lesion: Secondary | ICD-10-CM | POA: Diagnosis not present

## 2022-04-13 DIAGNOSIS — I5042 Chronic combined systolic (congestive) and diastolic (congestive) heart failure: Secondary | ICD-10-CM | POA: Diagnosis not present

## 2022-04-13 DIAGNOSIS — Z48812 Encounter for surgical aftercare following surgery on the circulatory system: Secondary | ICD-10-CM | POA: Diagnosis not present

## 2022-04-13 DIAGNOSIS — I25119 Atherosclerotic heart disease of native coronary artery with unspecified angina pectoris: Secondary | ICD-10-CM | POA: Diagnosis not present

## 2022-04-13 DIAGNOSIS — I2111 ST elevation (STEMI) myocardial infarction involving right coronary artery: Secondary | ICD-10-CM | POA: Diagnosis not present

## 2022-04-13 DIAGNOSIS — I110 Hypertensive heart disease with heart failure: Secondary | ICD-10-CM | POA: Diagnosis not present

## 2022-04-13 DIAGNOSIS — I2584 Coronary atherosclerosis due to calcified coronary lesion: Secondary | ICD-10-CM | POA: Diagnosis not present

## 2022-04-16 DIAGNOSIS — I2584 Coronary atherosclerosis due to calcified coronary lesion: Secondary | ICD-10-CM | POA: Diagnosis not present

## 2022-04-16 DIAGNOSIS — I25119 Atherosclerotic heart disease of native coronary artery with unspecified angina pectoris: Secondary | ICD-10-CM | POA: Diagnosis not present

## 2022-04-16 DIAGNOSIS — I5042 Chronic combined systolic (congestive) and diastolic (congestive) heart failure: Secondary | ICD-10-CM | POA: Diagnosis not present

## 2022-04-16 DIAGNOSIS — I110 Hypertensive heart disease with heart failure: Secondary | ICD-10-CM | POA: Diagnosis not present

## 2022-04-16 DIAGNOSIS — I2111 ST elevation (STEMI) myocardial infarction involving right coronary artery: Secondary | ICD-10-CM | POA: Diagnosis not present

## 2022-04-16 DIAGNOSIS — Z48812 Encounter for surgical aftercare following surgery on the circulatory system: Secondary | ICD-10-CM | POA: Diagnosis not present

## 2022-04-17 DIAGNOSIS — I110 Hypertensive heart disease with heart failure: Secondary | ICD-10-CM | POA: Diagnosis not present

## 2022-04-17 DIAGNOSIS — I25119 Atherosclerotic heart disease of native coronary artery with unspecified angina pectoris: Secondary | ICD-10-CM | POA: Diagnosis not present

## 2022-04-17 DIAGNOSIS — I2584 Coronary atherosclerosis due to calcified coronary lesion: Secondary | ICD-10-CM | POA: Diagnosis not present

## 2022-04-17 DIAGNOSIS — I5042 Chronic combined systolic (congestive) and diastolic (congestive) heart failure: Secondary | ICD-10-CM | POA: Diagnosis not present

## 2022-04-17 DIAGNOSIS — I2111 ST elevation (STEMI) myocardial infarction involving right coronary artery: Secondary | ICD-10-CM | POA: Diagnosis not present

## 2022-04-17 DIAGNOSIS — Z48812 Encounter for surgical aftercare following surgery on the circulatory system: Secondary | ICD-10-CM | POA: Diagnosis not present

## 2022-04-19 DIAGNOSIS — I25119 Atherosclerotic heart disease of native coronary artery with unspecified angina pectoris: Secondary | ICD-10-CM | POA: Diagnosis not present

## 2022-04-19 DIAGNOSIS — I5042 Chronic combined systolic (congestive) and diastolic (congestive) heart failure: Secondary | ICD-10-CM | POA: Diagnosis not present

## 2022-04-19 DIAGNOSIS — I110 Hypertensive heart disease with heart failure: Secondary | ICD-10-CM | POA: Diagnosis not present

## 2022-04-19 DIAGNOSIS — I2111 ST elevation (STEMI) myocardial infarction involving right coronary artery: Secondary | ICD-10-CM | POA: Diagnosis not present

## 2022-04-19 DIAGNOSIS — Z48812 Encounter for surgical aftercare following surgery on the circulatory system: Secondary | ICD-10-CM | POA: Diagnosis not present

## 2022-04-19 DIAGNOSIS — I2584 Coronary atherosclerosis due to calcified coronary lesion: Secondary | ICD-10-CM | POA: Diagnosis not present

## 2022-04-20 ENCOUNTER — Telehealth: Payer: Self-pay | Admitting: Internal Medicine

## 2022-04-20 NOTE — Telephone Encounter (Signed)
[facility] just called to make PCP aware that nursing will be seeing the patient for a new wound for the next 3-4 weeks. They will be faxing the orders to [place].

## 2022-04-20 NOTE — Telephone Encounter (Signed)
Noted  

## 2022-04-23 DIAGNOSIS — Z48812 Encounter for surgical aftercare following surgery on the circulatory system: Secondary | ICD-10-CM | POA: Diagnosis not present

## 2022-04-23 DIAGNOSIS — I25119 Atherosclerotic heart disease of native coronary artery with unspecified angina pectoris: Secondary | ICD-10-CM | POA: Diagnosis not present

## 2022-04-23 DIAGNOSIS — I2584 Coronary atherosclerosis due to calcified coronary lesion: Secondary | ICD-10-CM | POA: Diagnosis not present

## 2022-04-23 DIAGNOSIS — I5042 Chronic combined systolic (congestive) and diastolic (congestive) heart failure: Secondary | ICD-10-CM | POA: Diagnosis not present

## 2022-04-23 DIAGNOSIS — I110 Hypertensive heart disease with heart failure: Secondary | ICD-10-CM | POA: Diagnosis not present

## 2022-04-23 DIAGNOSIS — I2111 ST elevation (STEMI) myocardial infarction involving right coronary artery: Secondary | ICD-10-CM | POA: Diagnosis not present

## 2022-04-24 DIAGNOSIS — L97829 Non-pressure chronic ulcer of other part of left lower leg with unspecified severity: Secondary | ICD-10-CM | POA: Diagnosis not present

## 2022-04-25 ENCOUNTER — Ambulatory Visit
Admission: RE | Admit: 2022-04-25 | Discharge: 2022-04-25 | Disposition: A | Discharge status: Home / Self Care | Payer: Medicare Other | Source: Ambulatory Visit | Attending: Nurse Practitioner | Admitting: Nurse Practitioner

## 2022-04-25 DIAGNOSIS — I5042 Chronic combined systolic (congestive) and diastolic (congestive) heart failure: Secondary | ICD-10-CM | POA: Diagnosis not present

## 2022-04-25 DIAGNOSIS — I2111 ST elevation (STEMI) myocardial infarction involving right coronary artery: Secondary | ICD-10-CM | POA: Diagnosis not present

## 2022-04-25 DIAGNOSIS — Z1231 Encounter for screening mammogram for malignant neoplasm of breast: Secondary | ICD-10-CM

## 2022-04-25 DIAGNOSIS — Z48812 Encounter for surgical aftercare following surgery on the circulatory system: Secondary | ICD-10-CM | POA: Diagnosis not present

## 2022-04-25 DIAGNOSIS — I2584 Coronary atherosclerosis due to calcified coronary lesion: Secondary | ICD-10-CM | POA: Diagnosis not present

## 2022-04-25 DIAGNOSIS — I110 Hypertensive heart disease with heart failure: Secondary | ICD-10-CM | POA: Diagnosis not present

## 2022-04-25 DIAGNOSIS — I25119 Atherosclerotic heart disease of native coronary artery with unspecified angina pectoris: Secondary | ICD-10-CM | POA: Diagnosis not present

## 2022-04-26 DIAGNOSIS — Z48812 Encounter for surgical aftercare following surgery on the circulatory system: Secondary | ICD-10-CM | POA: Diagnosis not present

## 2022-04-26 DIAGNOSIS — I2111 ST elevation (STEMI) myocardial infarction involving right coronary artery: Secondary | ICD-10-CM | POA: Diagnosis not present

## 2022-04-26 DIAGNOSIS — I2584 Coronary atherosclerosis due to calcified coronary lesion: Secondary | ICD-10-CM | POA: Diagnosis not present

## 2022-04-26 DIAGNOSIS — I5042 Chronic combined systolic (congestive) and diastolic (congestive) heart failure: Secondary | ICD-10-CM | POA: Diagnosis not present

## 2022-04-26 DIAGNOSIS — I110 Hypertensive heart disease with heart failure: Secondary | ICD-10-CM | POA: Diagnosis not present

## 2022-04-26 DIAGNOSIS — I25119 Atherosclerotic heart disease of native coronary artery with unspecified angina pectoris: Secondary | ICD-10-CM | POA: Diagnosis not present

## 2022-04-27 DIAGNOSIS — I110 Hypertensive heart disease with heart failure: Secondary | ICD-10-CM | POA: Diagnosis not present

## 2022-04-27 DIAGNOSIS — I2584 Coronary atherosclerosis due to calcified coronary lesion: Secondary | ICD-10-CM | POA: Diagnosis not present

## 2022-04-27 DIAGNOSIS — I2111 ST elevation (STEMI) myocardial infarction involving right coronary artery: Secondary | ICD-10-CM | POA: Diagnosis not present

## 2022-04-27 DIAGNOSIS — I5042 Chronic combined systolic (congestive) and diastolic (congestive) heart failure: Secondary | ICD-10-CM | POA: Diagnosis not present

## 2022-04-27 DIAGNOSIS — Z48812 Encounter for surgical aftercare following surgery on the circulatory system: Secondary | ICD-10-CM | POA: Diagnosis not present

## 2022-04-27 DIAGNOSIS — I25119 Atherosclerotic heart disease of native coronary artery with unspecified angina pectoris: Secondary | ICD-10-CM | POA: Diagnosis not present

## 2022-05-01 ENCOUNTER — Telehealth: Payer: Self-pay | Admitting: Internal Medicine

## 2022-05-01 DIAGNOSIS — I25119 Atherosclerotic heart disease of native coronary artery with unspecified angina pectoris: Secondary | ICD-10-CM | POA: Diagnosis not present

## 2022-05-01 DIAGNOSIS — I2111 ST elevation (STEMI) myocardial infarction involving right coronary artery: Secondary | ICD-10-CM | POA: Diagnosis not present

## 2022-05-01 DIAGNOSIS — I2584 Coronary atherosclerosis due to calcified coronary lesion: Secondary | ICD-10-CM | POA: Diagnosis not present

## 2022-05-01 DIAGNOSIS — I5042 Chronic combined systolic (congestive) and diastolic (congestive) heart failure: Secondary | ICD-10-CM | POA: Diagnosis not present

## 2022-05-01 DIAGNOSIS — I110 Hypertensive heart disease with heart failure: Secondary | ICD-10-CM | POA: Diagnosis not present

## 2022-05-01 DIAGNOSIS — Z48812 Encounter for surgical aftercare following surgery on the circulatory system: Secondary | ICD-10-CM | POA: Diagnosis not present

## 2022-05-01 NOTE — Telephone Encounter (Signed)
[name] [facility]) called stating that pt still has pitting adema and reccommended keeping her on the lasix. Weight is stable and pt has lost weight per Dawn.

## 2022-05-02 NOTE — Telephone Encounter (Signed)
LMOM for [name]- informing that PCP has not seen Pt since d/c from hospital, we had been informed by [name] that they were planning to move her to a nursing facility and establishing w/ a provider there- informed if that is not the case Pt needs to be seen in the office.

## 2022-05-03 DIAGNOSIS — I110 Hypertensive heart disease with heart failure: Secondary | ICD-10-CM | POA: Diagnosis not present

## 2022-05-03 DIAGNOSIS — I2584 Coronary atherosclerosis due to calcified coronary lesion: Secondary | ICD-10-CM | POA: Diagnosis not present

## 2022-05-03 DIAGNOSIS — I25119 Atherosclerotic heart disease of native coronary artery with unspecified angina pectoris: Secondary | ICD-10-CM | POA: Diagnosis not present

## 2022-05-03 DIAGNOSIS — Z48812 Encounter for surgical aftercare following surgery on the circulatory system: Secondary | ICD-10-CM | POA: Diagnosis not present

## 2022-05-03 DIAGNOSIS — I2111 ST elevation (STEMI) myocardial infarction involving right coronary artery: Secondary | ICD-10-CM | POA: Diagnosis not present

## 2022-05-03 DIAGNOSIS — I5042 Chronic combined systolic (congestive) and diastolic (congestive) heart failure: Secondary | ICD-10-CM | POA: Diagnosis not present

## 2022-05-08 ENCOUNTER — Telehealth: Payer: Self-pay

## 2022-05-08 DIAGNOSIS — I110 Hypertensive heart disease with heart failure: Secondary | ICD-10-CM | POA: Diagnosis not present

## 2022-05-08 DIAGNOSIS — I2584 Coronary atherosclerosis due to calcified coronary lesion: Secondary | ICD-10-CM | POA: Diagnosis not present

## 2022-05-08 DIAGNOSIS — I25119 Atherosclerotic heart disease of native coronary artery with unspecified angina pectoris: Secondary | ICD-10-CM | POA: Diagnosis not present

## 2022-05-08 DIAGNOSIS — Z48812 Encounter for surgical aftercare following surgery on the circulatory system: Secondary | ICD-10-CM | POA: Diagnosis not present

## 2022-05-08 DIAGNOSIS — I2111 ST elevation (STEMI) myocardial infarction involving right coronary artery: Secondary | ICD-10-CM | POA: Diagnosis not present

## 2022-05-08 DIAGNOSIS — I5042 Chronic combined systolic (congestive) and diastolic (congestive) heart failure: Secondary | ICD-10-CM | POA: Diagnosis not present

## 2022-05-08 NOTE — Telephone Encounter (Signed)
Pt called stating that she has a Dx of Polycythemia and her legs are swollen. She says she has been trying to get in touch with Dr Loreli Slot office to no avail. So she called our office asking for advise.   I did see Kaylyn's last telephone encounter where she noted that the pt had not been seen in a long while and that her family was planning to move her into a nursing facility.   Pts callback number is: [phone]. Please advise.

## 2022-05-08 NOTE — Telephone Encounter (Signed)
Please ask patient to come to the office tomorrow at 8:40 AM. She really needs to be seen in person. If she is homebound, I potentially could see her virtually but again she really needs to be seen in person.

## 2022-05-08 NOTE — Telephone Encounter (Signed)
Called Elizeth back- got no answer and went straight to VM.   So, I then called her daughter Early Chars- and explained the call Arynn made and why I was trying to reach her. She told me that the pt was now on Lasix and to kindly disregard the call that her mother had placed. Dr Larose Kells is aware.

## 2022-05-09 DIAGNOSIS — I110 Hypertensive heart disease with heart failure: Secondary | ICD-10-CM | POA: Diagnosis not present

## 2022-05-09 DIAGNOSIS — Z853 Personal history of malignant neoplasm of breast: Secondary | ICD-10-CM | POA: Diagnosis not present

## 2022-05-09 DIAGNOSIS — I480 Paroxysmal atrial fibrillation: Secondary | ICD-10-CM | POA: Diagnosis not present

## 2022-05-09 DIAGNOSIS — I5033 Acute on chronic diastolic (congestive) heart failure: Secondary | ICD-10-CM | POA: Diagnosis not present

## 2022-05-09 DIAGNOSIS — E785 Hyperlipidemia, unspecified: Secondary | ICD-10-CM | POA: Diagnosis not present

## 2022-05-12 DIAGNOSIS — I2584 Coronary atherosclerosis due to calcified coronary lesion: Secondary | ICD-10-CM | POA: Diagnosis not present

## 2022-05-12 DIAGNOSIS — Z48812 Encounter for surgical aftercare following surgery on the circulatory system: Secondary | ICD-10-CM | POA: Diagnosis not present

## 2022-05-12 DIAGNOSIS — I2111 ST elevation (STEMI) myocardial infarction involving right coronary artery: Secondary | ICD-10-CM | POA: Diagnosis not present

## 2022-05-12 DIAGNOSIS — I25119 Atherosclerotic heart disease of native coronary artery with unspecified angina pectoris: Secondary | ICD-10-CM | POA: Diagnosis not present

## 2022-05-12 DIAGNOSIS — I110 Hypertensive heart disease with heart failure: Secondary | ICD-10-CM | POA: Diagnosis not present

## 2022-05-12 DIAGNOSIS — I5042 Chronic combined systolic (congestive) and diastolic (congestive) heart failure: Secondary | ICD-10-CM | POA: Diagnosis not present

## 2022-05-20 IMAGING — CT CT CERVICAL SPINE W/O CM
3 of 4 series · 12 of 33 positions shown, 14 images · non-contrast
Comparison: Brain MRI 07/19/2018, head CT 07/01/2017, cervical
spine MRI 05/04/2005.

CLINICAL DATA: Head trauma, headache. Poly trauma, critical,
head/cervical spine injury suspected. Additional history provided:
Mechanical fall landing on back, laceration to back of head.

EXAM:
CT HEAD WITHOUT CONTRAST
CT CERVICAL SPINE WITHOUT CONTRAST
TECHNIQUE: Multidetector CT imaging of the head and cervical spine was
performed following the standard protocol without intravenous
contrast. Multiplanar CT image reconstructions of the cervical spine
were also generated.

[Series 5: orthogonal bone · axial · 0.22mm/px · z∈[+1430,+1546]mm · 4 of 96 slices shown, 5 images]
[im 14/96  soft-tissue]
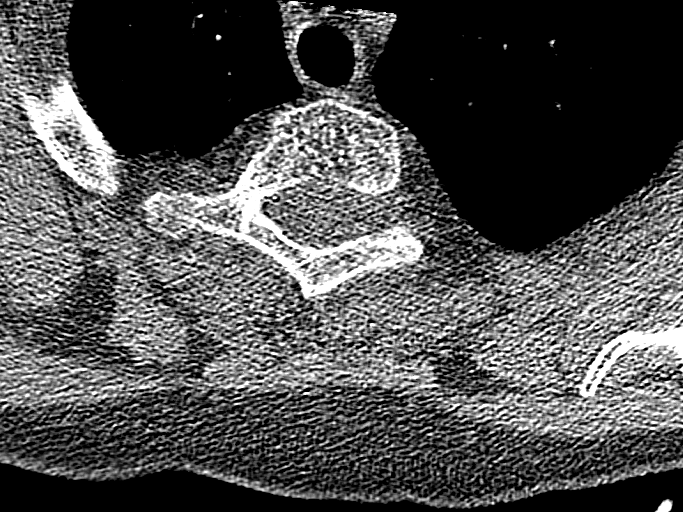
[im 14/96  bone]
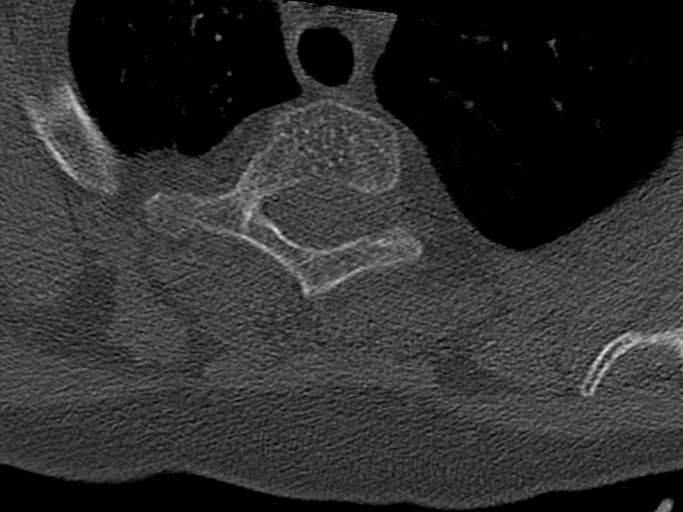
[im 41/96  bone]
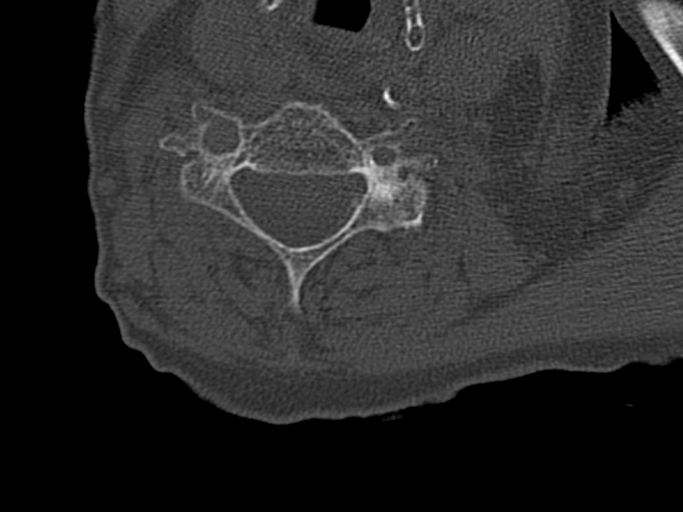
[im 55/96  bone]
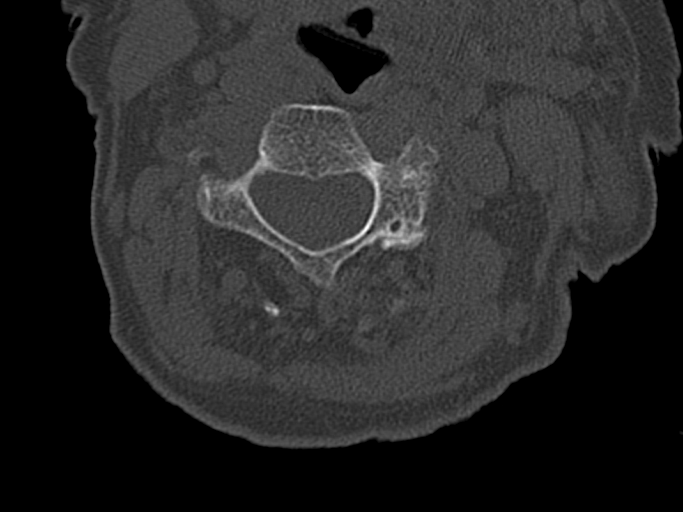
[im 82/96  bone]
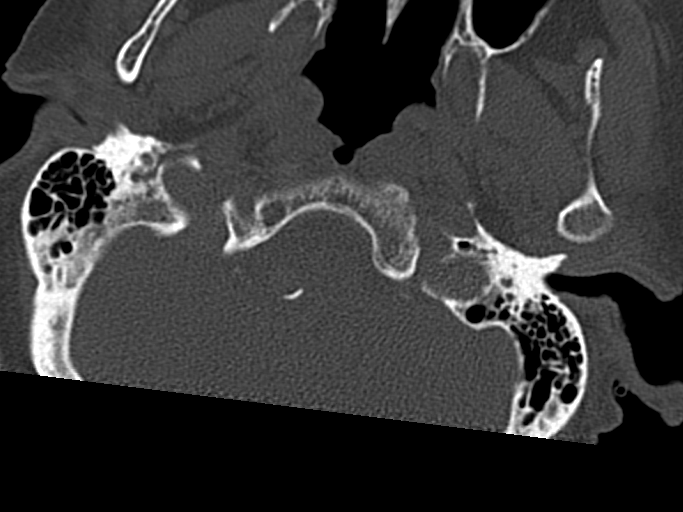

[Series 6: coronal bone · coronal · 0.39mm/px · 3 of 40 slices shown]
[im 8/40  bone]
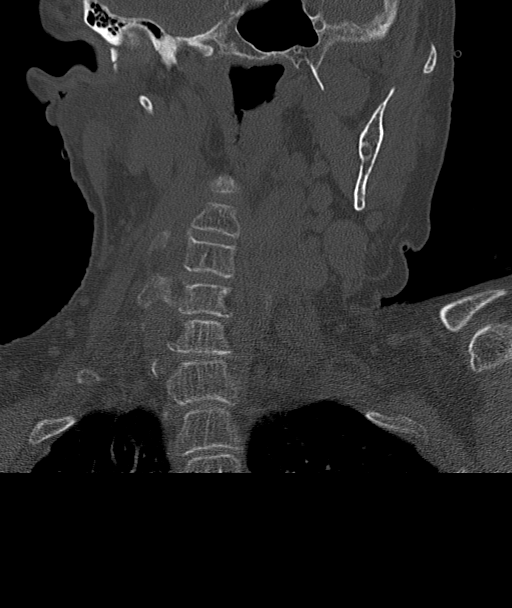
[im 16/40  bone]
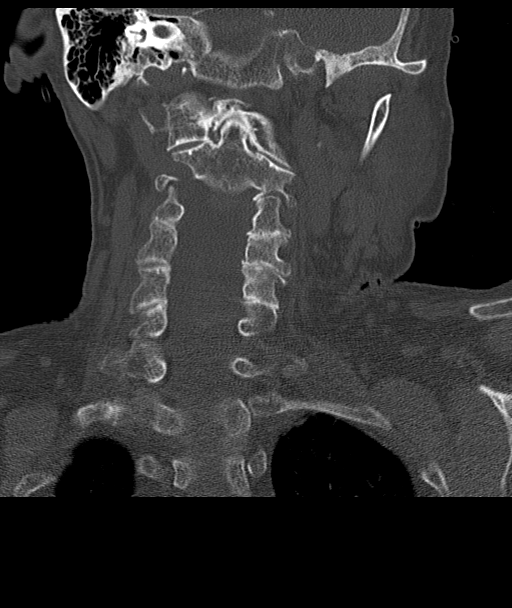
[im 24/40  bone]
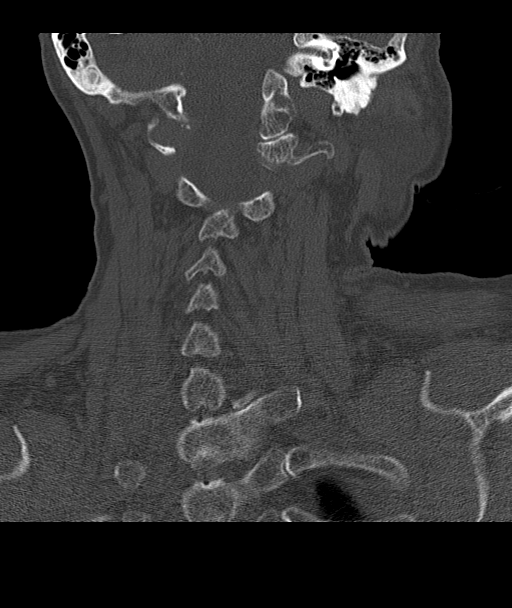

[Series 7: sagittal bone · sagittal · 0.35mm/px · 5 of 47 slices shown, 6 images]
[im 16/47  bone]
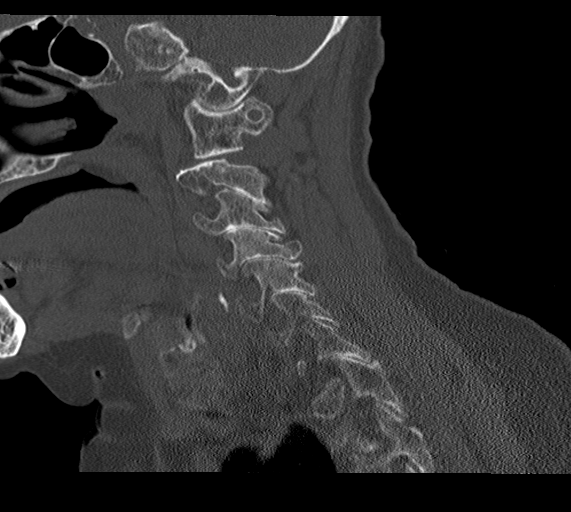
[im 20/47  bone]
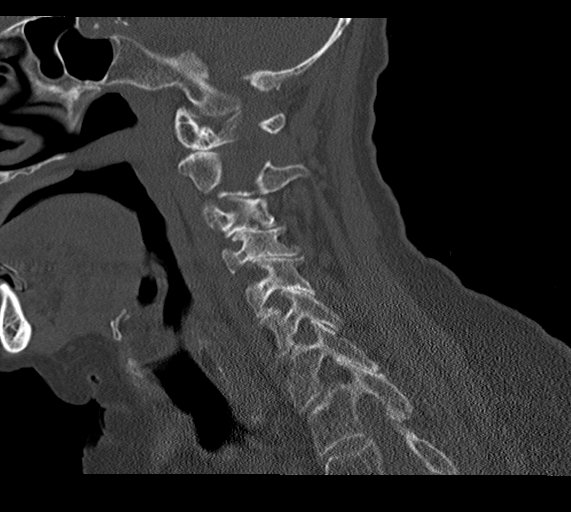
[im 24/47  soft-tissue]
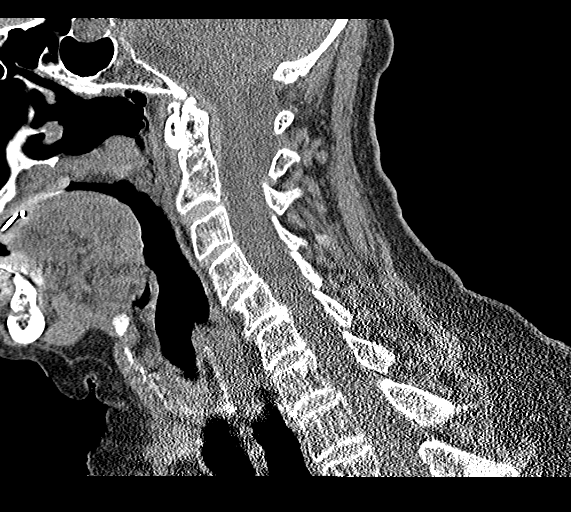
[im 24/47  bone]
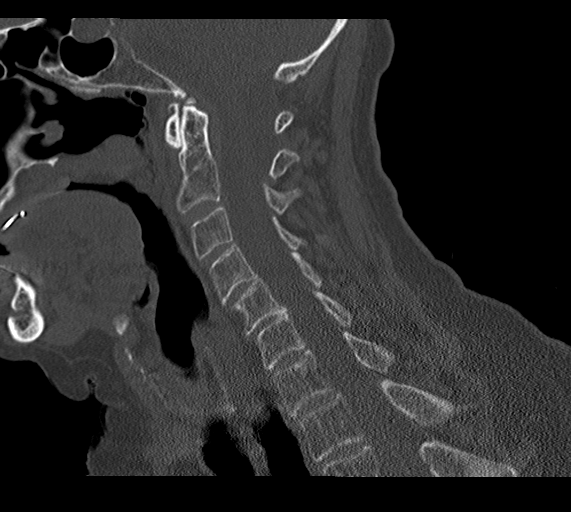
[im 27/47  bone]
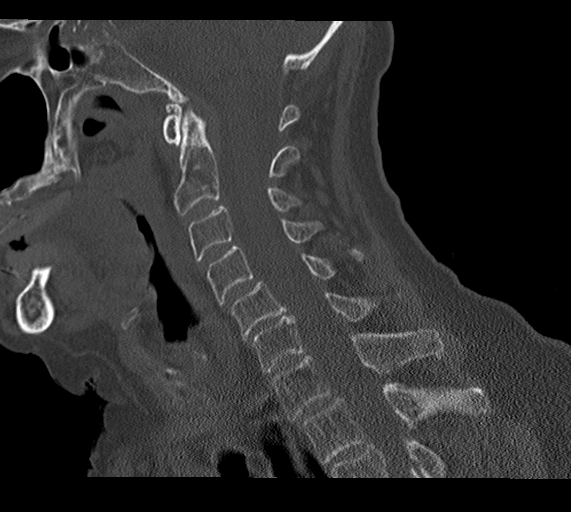
[im 31/47  bone]
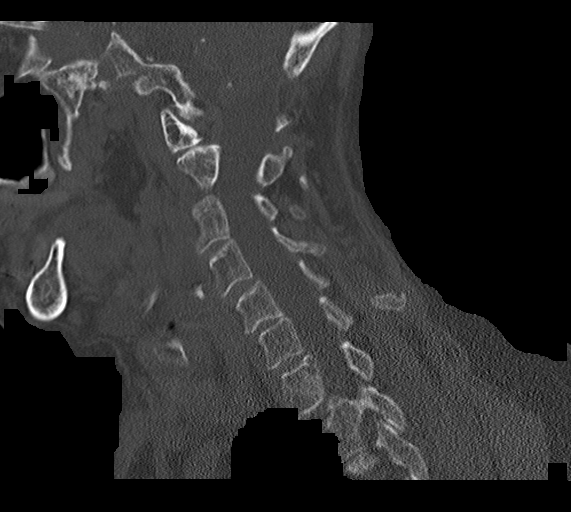

[12 of 33 positions shown; findings below may reference images not displayed]

FINDINGS: CT HEAD FINDINGS

Brain:

There is acute extra-axial hemorrhage along the inferolateral right
temporal lobe measuring up to 11 mm in thickness (for instance as
seen on series 5, image 33 and series 3, image 8). Additionally,
there is subtle hemorrhagic parenchymal contusion within the
anterior right temporal lobe (series 3, image 5) (series 5, image
21). Trace acute subdural hemorrhage is also questioned along the
right tentorium.

Hemorrhagic parenchymal contusions within the anteroinferior right
frontal lobe with overlying small volume acute subarachnoid
hemorrhage (greater on the right).

Stable moderate chronic small vessel ischemic changes within the
cerebral white matter. Redemonstrated chronic right thalamic lacunar
infarct. Stable mild generalized parenchymal atrophy.

No evidence of intracranial mass.

There is no hydrocephalus or midline shift.

Vascular: No hyperdense vessel.  Atherosclerotic calcifications.

Skull: There is a nondisplaced acute fracture within the left
occipital calvarium extending inferiorly to the level of the foramen
magnum.

Sinuses/Orbits: Visualized orbits show no acute finding. No
significant paranasal sinus disease or mastoid effusion at the
imaged levels.

CT CERVICAL SPINE FINDINGS

Alignment: Straightening of the expected cervical lordosis. Trace
C4-C5 grade 1 anterolisthesis. There is leftward rotation of C1 upon
C2, which may be related to patient head positioning at the time of
examination.

Skull base and vertebrae: The basion-dental and atlanto-dental
intervals are maintained.No evidence of acute fracture to the
cervical spine. Congenital nonunion of the posterior arch of C1.

Soft tissues and spinal canal: No prevertebral fluid or swelling. No
visible canal hematoma.

Disc levels: Mild for age cervical spondylosis. No high-grade bony
spinal canal narrowing.

Upper chest: No consolidation within the imaged lung apices. No
visible pneumothorax.

These results were called by telephone at the time of interpretation
on 11/04/2019 at [DATE] to provider YOEL TIGER , who verbally
acknowledged these results.
IMPRESSION: CT head:

1. Hemorrhagic parenchymal contusions within the anteroinferior
right frontal lobe with overlying small volume acute subarachnoid
hemorrhage.
2. Subtle hemorrhagic parenchymal contusion is also present within
the anterior right temporal lobe.
3. Acute extra-axial hemorrhage overlying the inferolateral right
temporal lobe measuring up to 11 mm in thickness. Additionally,
there is suspected trace acute subdural hemorrhage along the right
tentorium.
4. Acute nondisplaced fracture of the left occipital calvarium
extending inferiorly to the level of the foramen magnum.
5. Unchanged mild generalized parenchymal atrophy with moderate
chronic small vessel ischemic disease. Redemonstrated chronic right
thalamic lacunar infarct.

CT cervical spine:

1. No evidence of acute fracture to the cervical spine.
2. Leftward rotation of C1 upon C2, which may be related to patient
head positioning at the time of examination. Clinical correlation is
recommended.
3. Mild for age cervical spondylosis.
4. Mild C4-C5 grade 1 anterolisthesis.

## 2022-05-20 IMAGING — CR DG PELVIS 1-2V
1 series · 1 of 1 positions shown · non-contrast
Comparison: None.

CLINICAL DATA: Fall

EXAM:
PELVIS - 1-2 VIEW

[x pelvis]
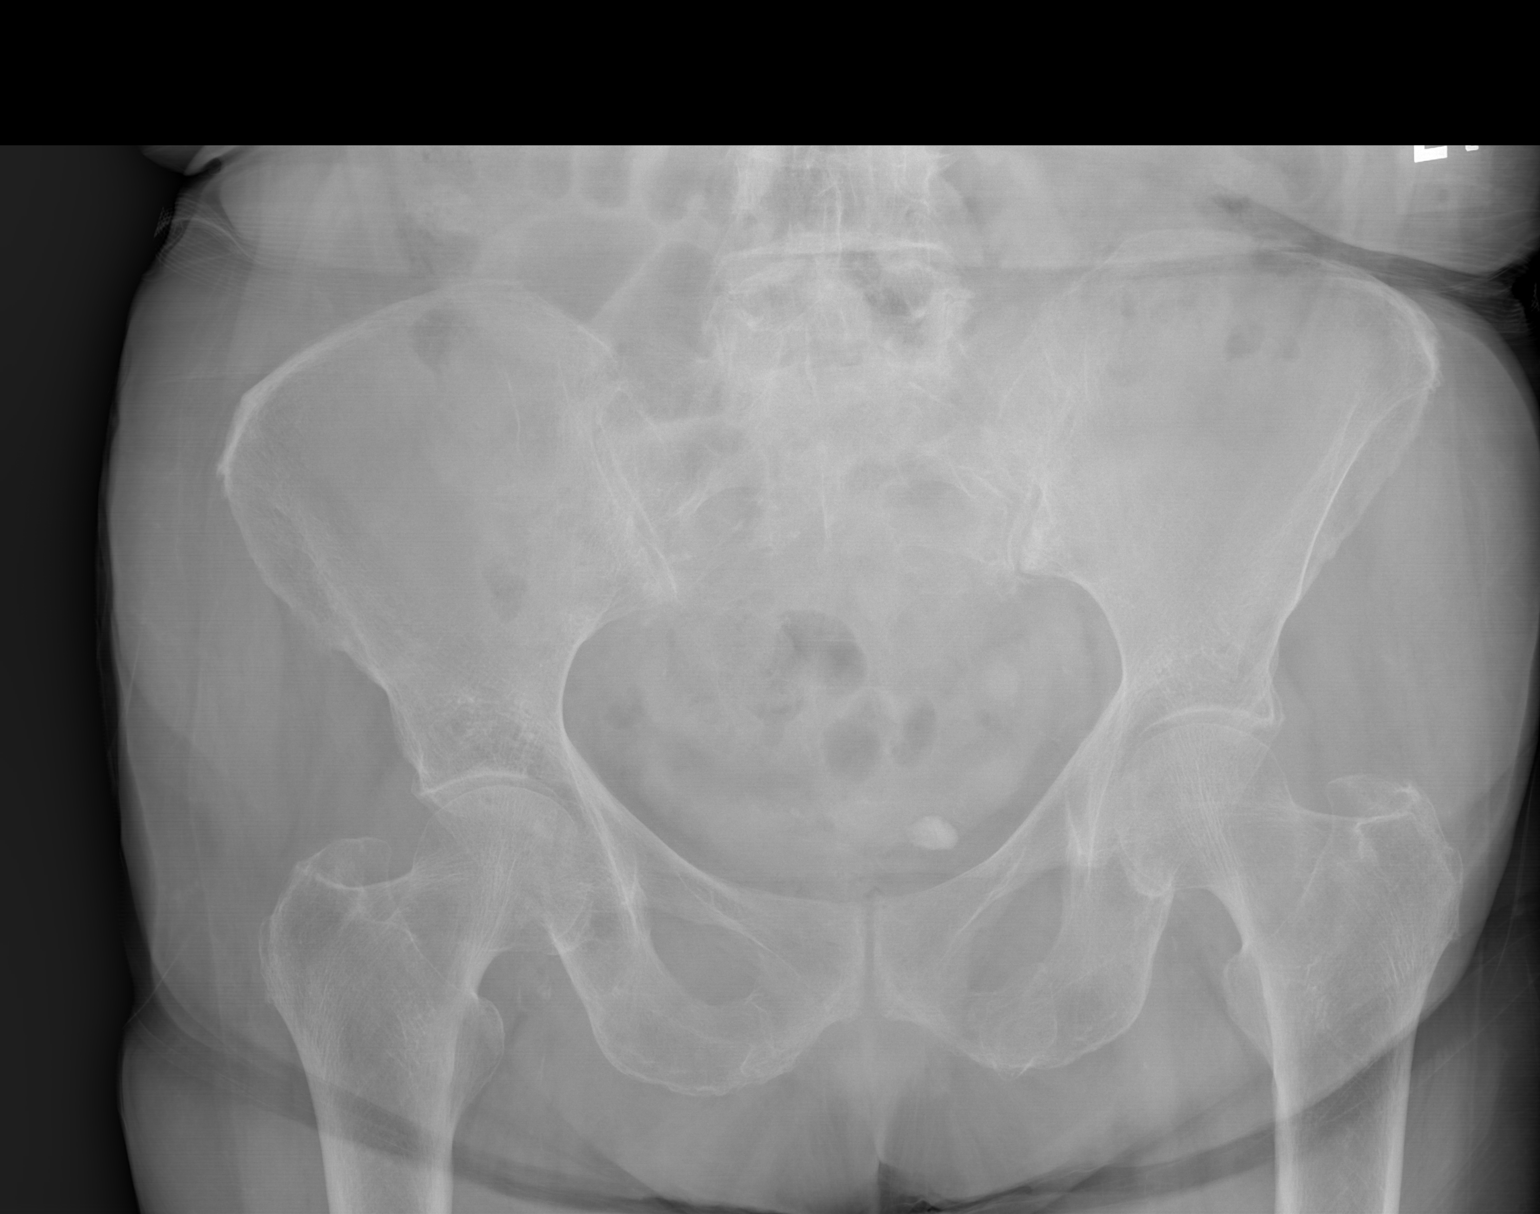

[1 of 1 positions shown; findings below may reference images not displayed]

FINDINGS: Osteopenia. There is no evidence of displaced pelvic fracture or
diastasis. No pelvic bone lesions are seen. Possible bladder
calculus or calcified uterine fibroid in the low left hemipelvis.
IMPRESSION: Osteopenia. No acute displaced fracture or dislocation of the
pelvis. Please note that plain radiographs are insensitive for hip
and pelvic fracture, particularly in the setting of osteopenia.
Consider CT or MRI to more sensitively evaluate for fracture if
there is high clinical suspicion.

## 2022-05-20 IMAGING — CT CT PELVIS W/O CM
2 of 3 series · 15 of 46 positions shown, 17 images · non-contrast
Comparison: Pelvic radiographs today.  Pelvic CT 07/01/2017.

CLINICAL DATA: Low back and left leg pain after falling. History of
spinal augmentation.

EXAM:
CT PELVIS WITHOUT CONTRAST
TECHNIQUE: Multidetector CT imaging of the pelvis was performed following the
standard protocol without intravenous contrast.

[Series 3: axial st · axial · 0.98mm/px · z∈[+878,+1093]mm · 12 of 51 slices shown, 14 images]
[im 4/51  soft-tissue]
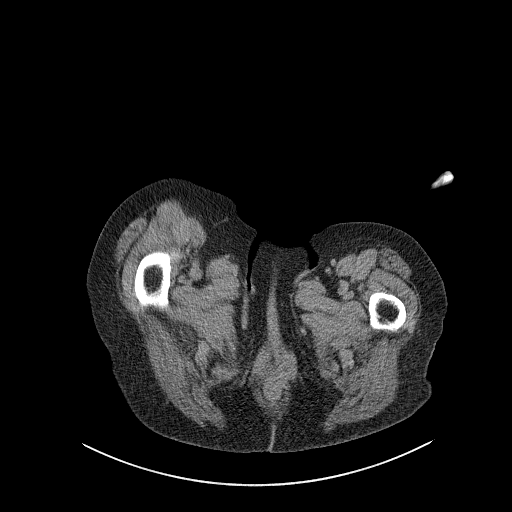
[im 4/51  bone]
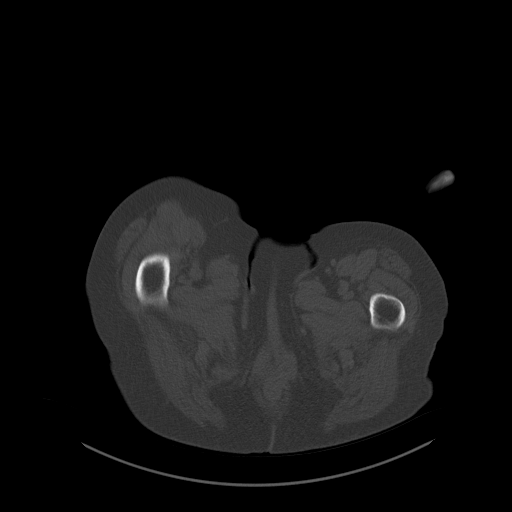
[im 7/51  soft-tissue]
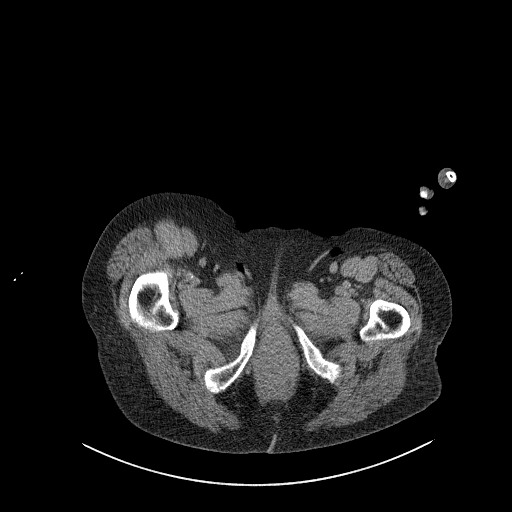
[im 12/51  soft-tissue]
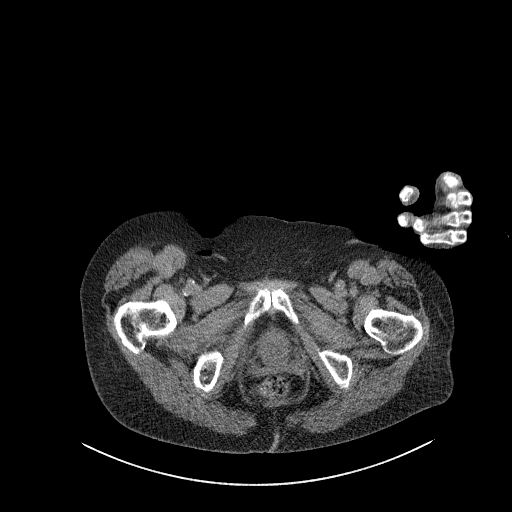
[im 15/51  soft-tissue]
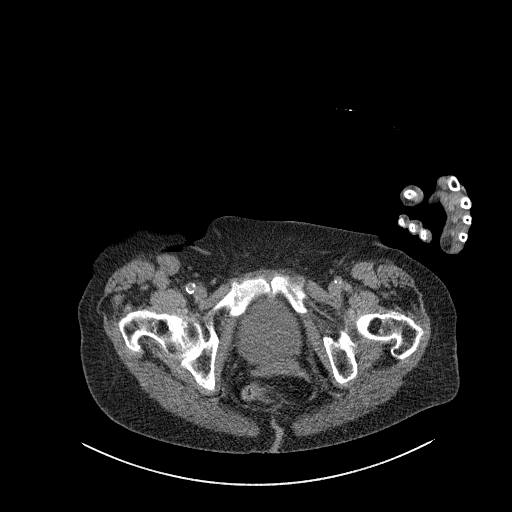
[im 20/51  soft-tissue]
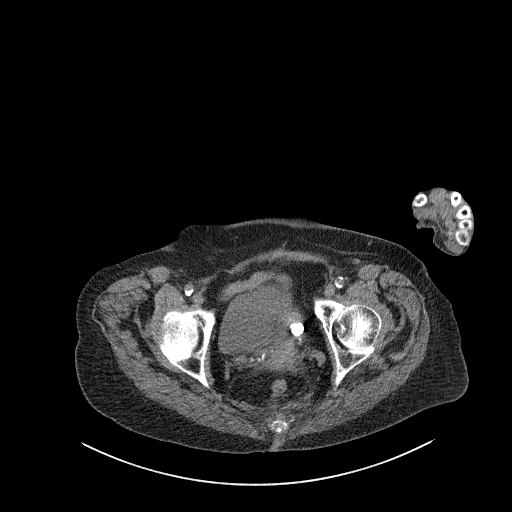
[im 23/51  soft-tissue]
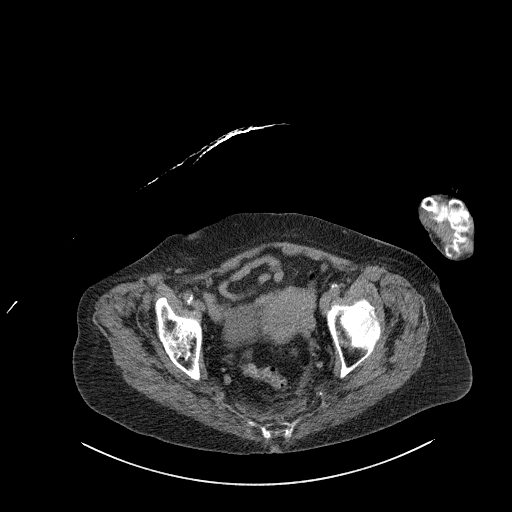
[im 28/51  soft-tissue]
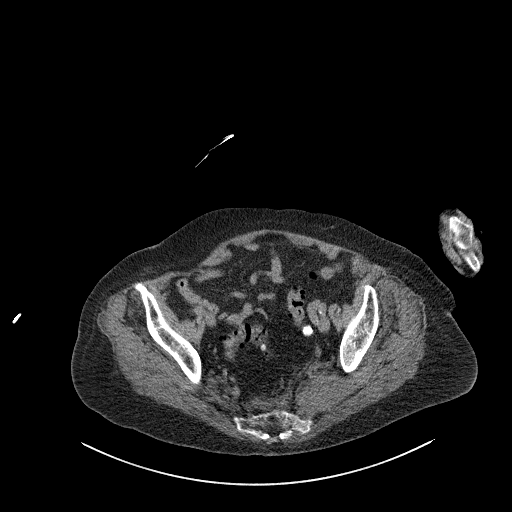
[im 31/51  soft-tissue]
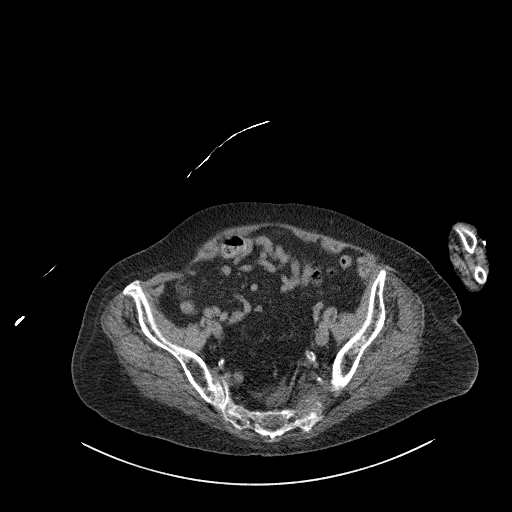
[im 36/51  soft-tissue]
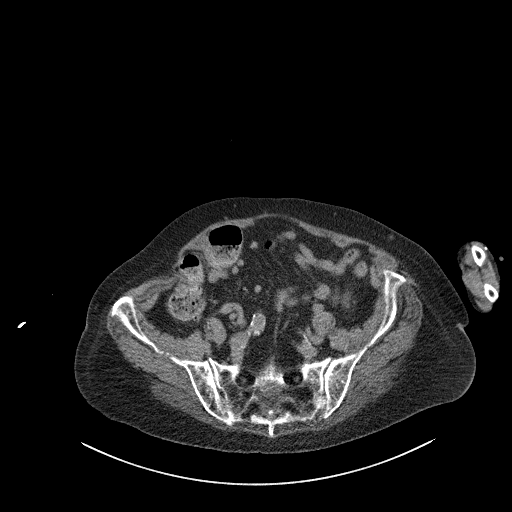
[im 36/51  bone]
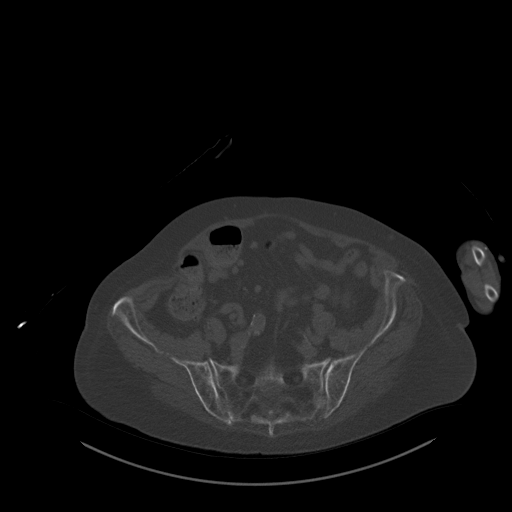
[im 39/51  soft-tissue]
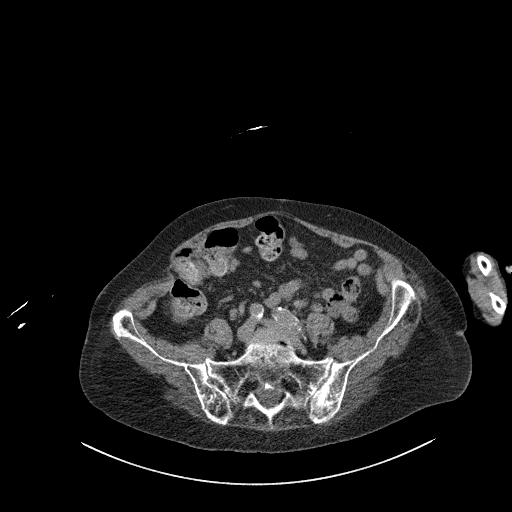
[im 44/51  soft-tissue]
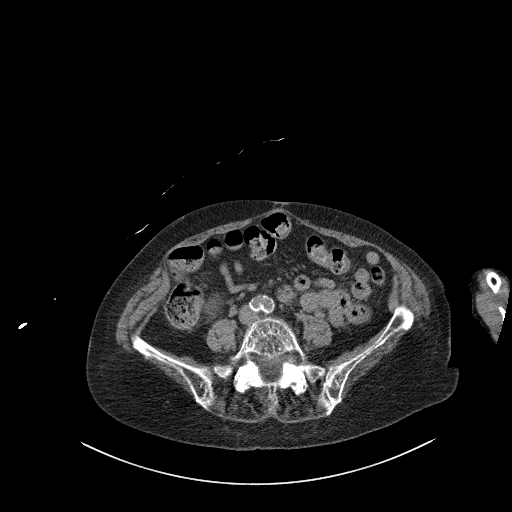
[im 47/51  soft-tissue]
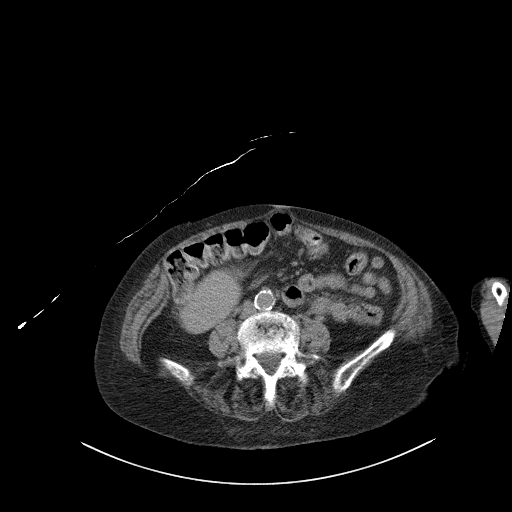

[Series 9: coronal st · coronal · 0.58mm/px · 3 of 124 slices shown]
[im 42/124  soft-tissue]
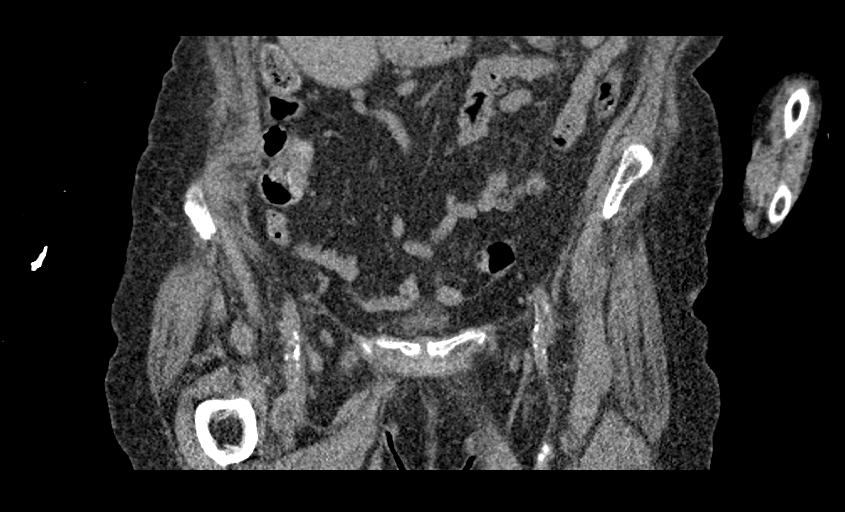
[im 55/124  soft-tissue]
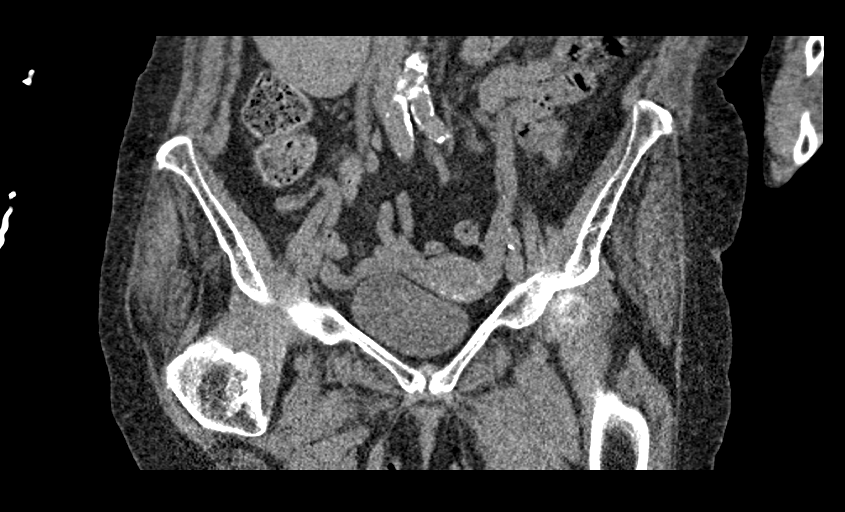
[im 69/124  soft-tissue]
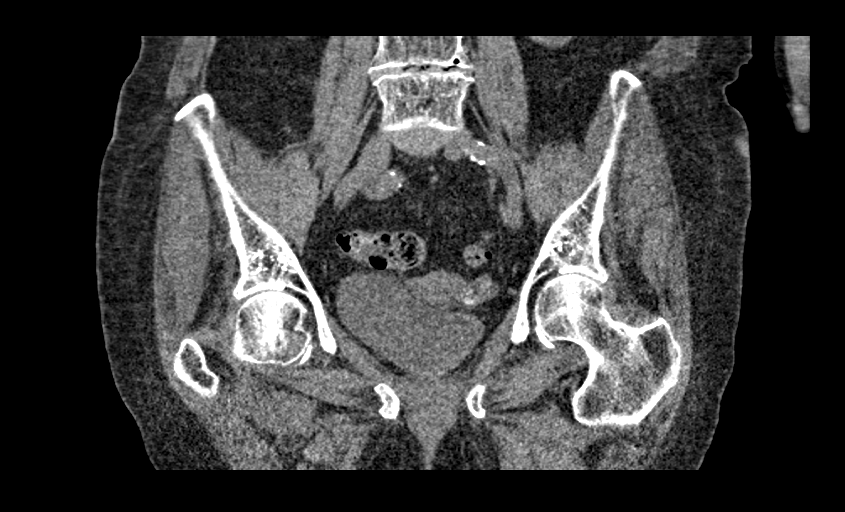

[15 of 46 positions shown; findings below may reference images not displayed]

FINDINGS: Urinary Tract: The visualized distal ureters and bladder appear
unremarkable.

Bowel: No bowel wall thickening, distention or surrounding
inflammation identified within the pelvis. Mild sigmoid colon
diverticular changes.

Vascular/Lymphatic: No enlarged pelvic lymph nodes identified.
Diffuse aortoiliac atherosclerosis.

Reproductive: The uterus and ovaries appear stable. There is a
calcified anterior uterine fibroid. No adnexal mass.

Other: No pelvic ascites.

Musculoskeletal: There is new asymmetric enlargement of the left
piriformis muscle (image [DATE]), likely indicative of an acute
hematoma. There is chronic atrophy of the right piriformis muscle.
No other muscular abnormalities or hematomas are seen. The bones are
diffusely demineralized. No evidence of acute fracture or
dislocation. Stable sclerosis posteriorly in both iliac bones and
stable chronic posttraumatic deformities of both pubic rami. There
is also deformity of the lower sacrum on the sagittal images which
appears new compared with prior CT, although demonstrates no
definite acute features.
IMPRESSION: 1. New asymmetric enlargement of the left piriformis muscle, likely
indicative of an acute hematoma.
2. No evidence of acute fracture or dislocation. Age indeterminate
lower sacral deformity, not likely acute. Chronic posttraumatic
deformities of both pubic rami.
3. Aortic Atherosclerosis ([id number]).

## 2022-05-20 IMAGING — CR DG CHEST 2V
2 series · 2 of 2 positions shown · non-contrast
Comparison: June 24, 2018.

CLINICAL DATA: Fall.

EXAM:
CHEST - 2 VIEW

[w chest lat]
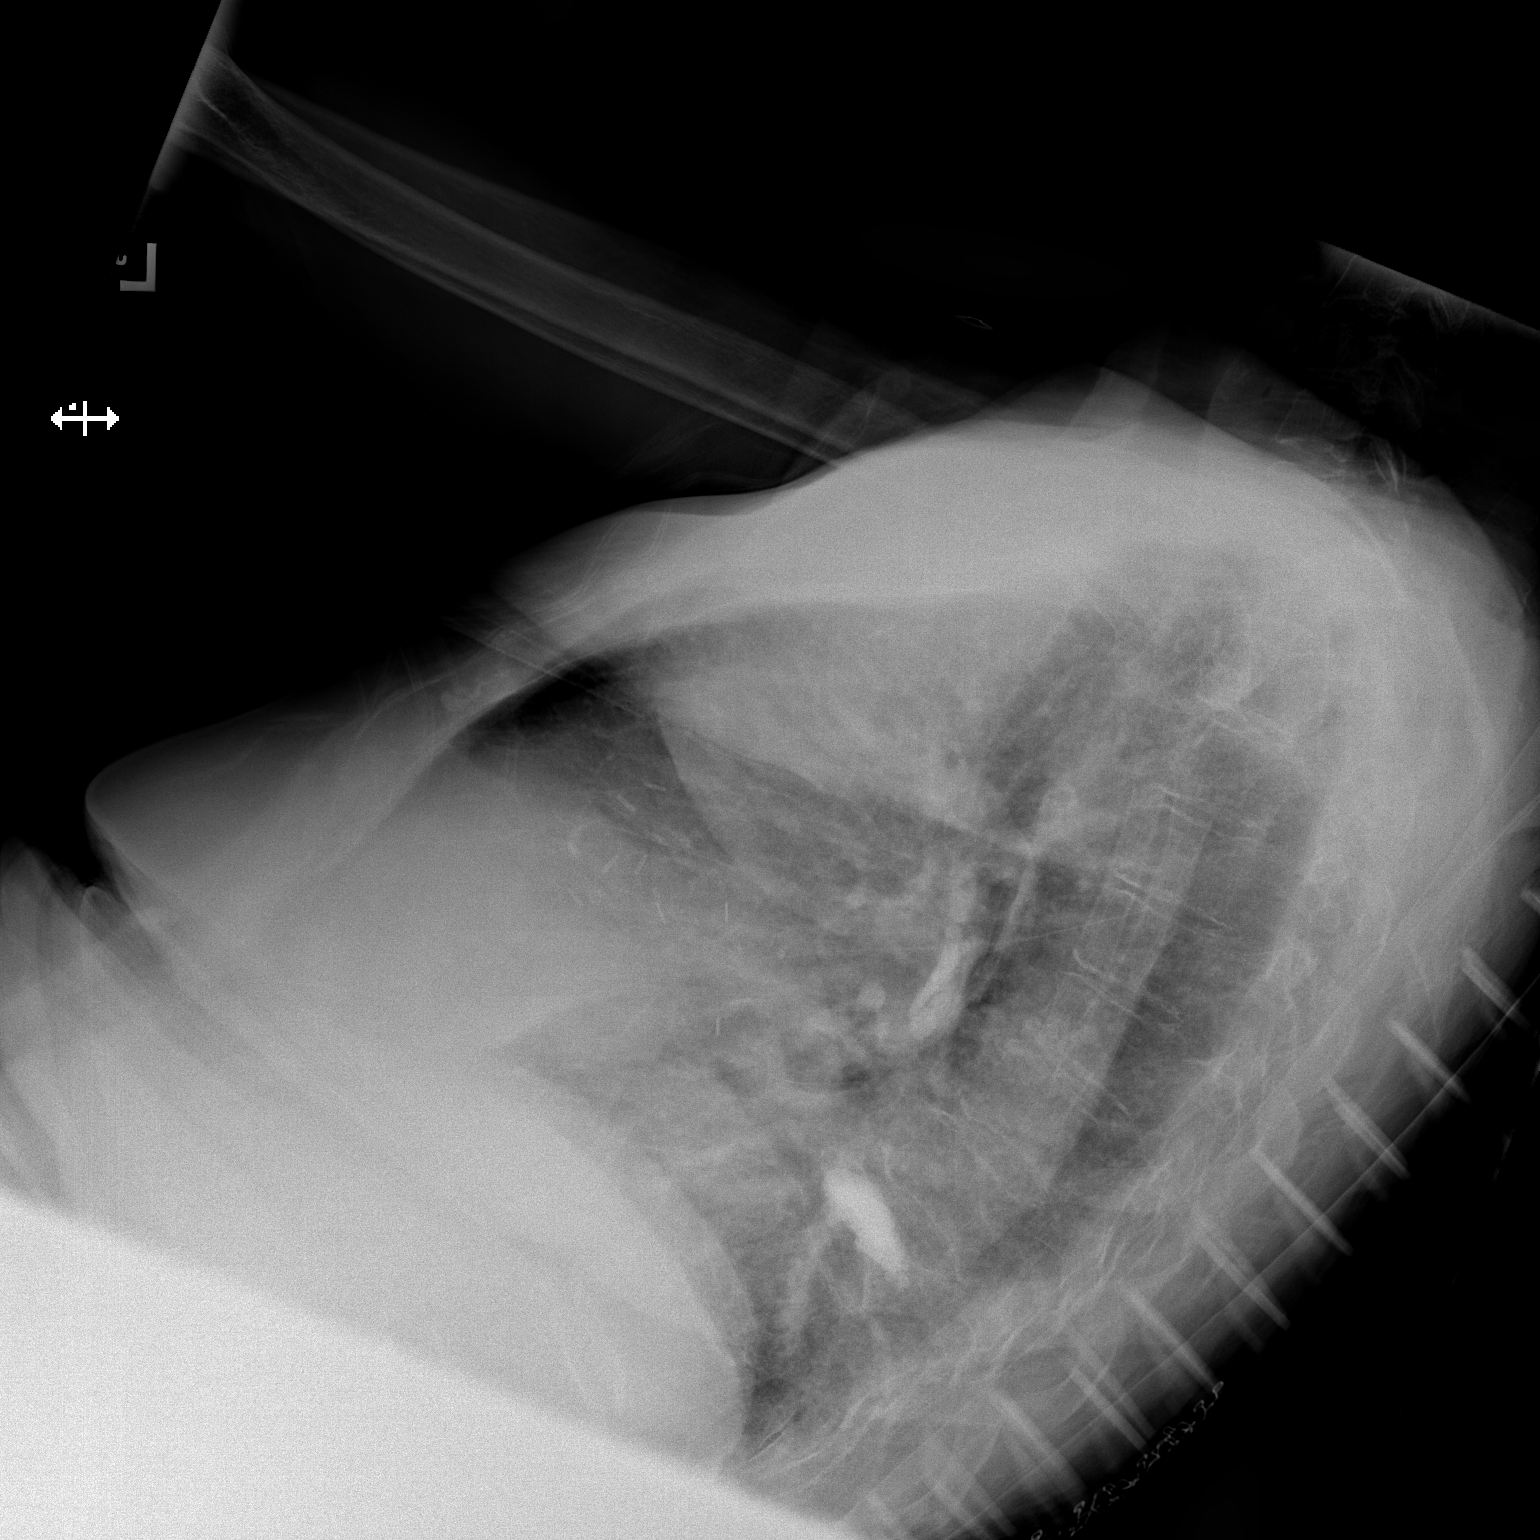

[x chest ap]
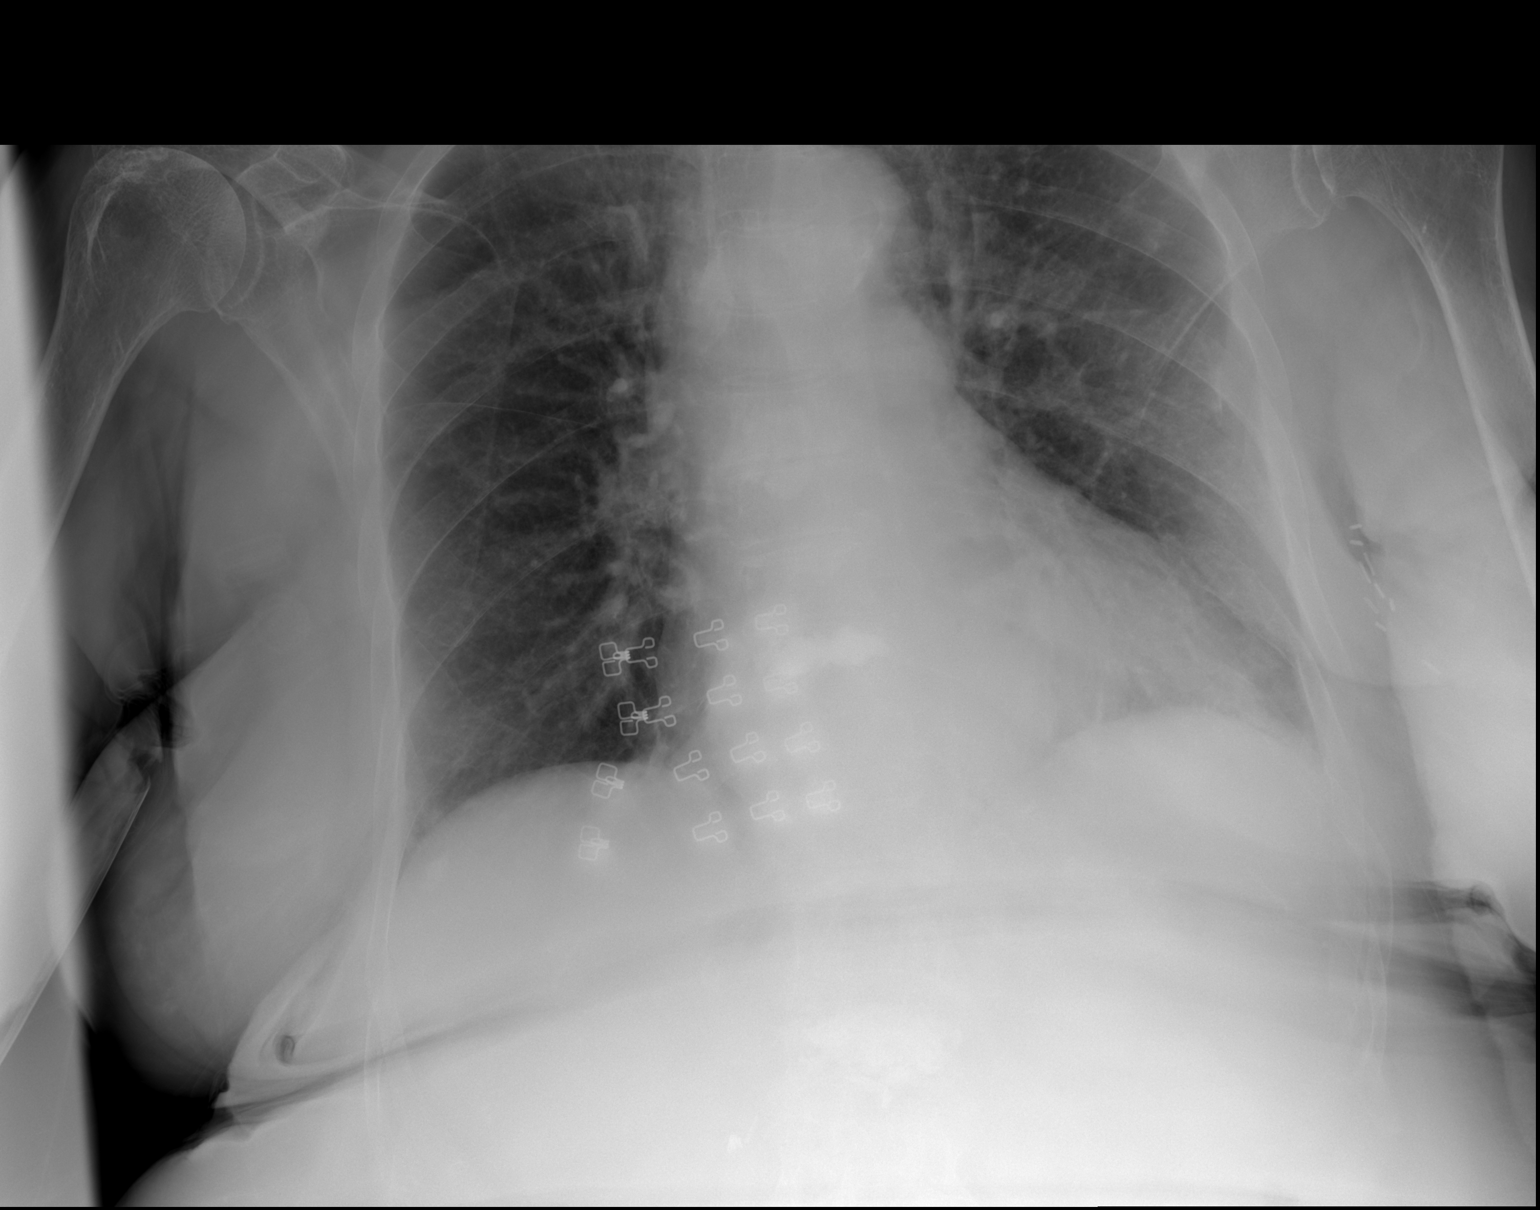

[2 of 2 positions shown; findings below may reference images not displayed]

FINDINGS: Stable cardiomediastinal silhouette. Hiatal hernia is noted. No
pneumothorax or pleural effusion is noted. Both lungs are clear. The
visualized skeletal structures are unremarkable.
IMPRESSION: No active cardiopulmonary disease. Hiatal hernia.

## 2022-05-20 IMAGING — CT CT HEAD W/O CM
2 of 3 series · 13 of 47 positions shown, 16 images · non-contrast
Comparison: Brain MRI 07/19/2018, head CT 07/01/2017, cervical
spine MRI 05/04/2005.

CLINICAL DATA: Head trauma, headache. Poly trauma, critical,
head/cervical spine injury suspected. Additional history provided:
Mechanical fall landing on back, laceration to back of head.

EXAM:
CT HEAD WITHOUT CONTRAST
CT CERVICAL SPINE WITHOUT CONTRAST
TECHNIQUE: Multidetector CT imaging of the head and cervical spine was
performed following the standard protocol without intravenous
contrast. Multiplanar CT image reconstructions of the cervical spine
were also generated.

[Series 3: head wo · axial · 0.48mm/px · z∈[+1570,+1695]mm · 10 of 31 slices shown, 13 images]
[im 3/31  brain]
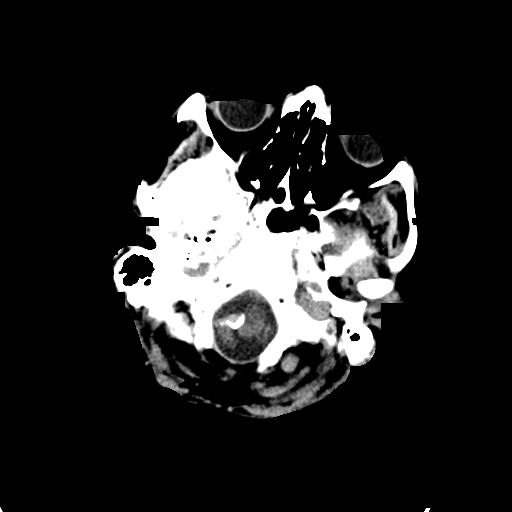
[im 3/31  bone]
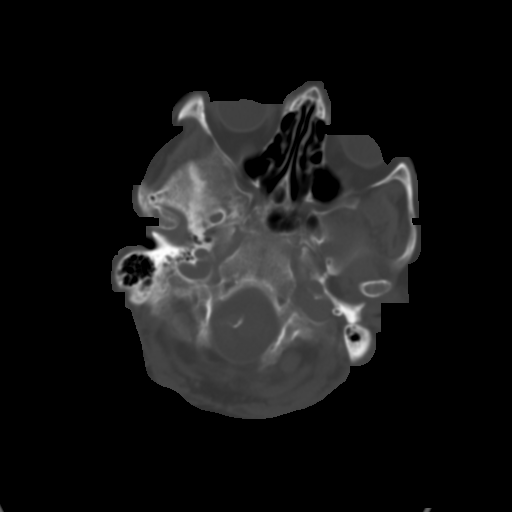
[im 6/31  brain]
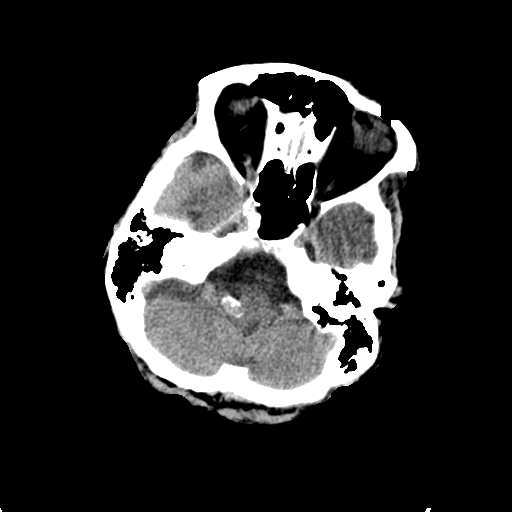
[im 9/31  brain]
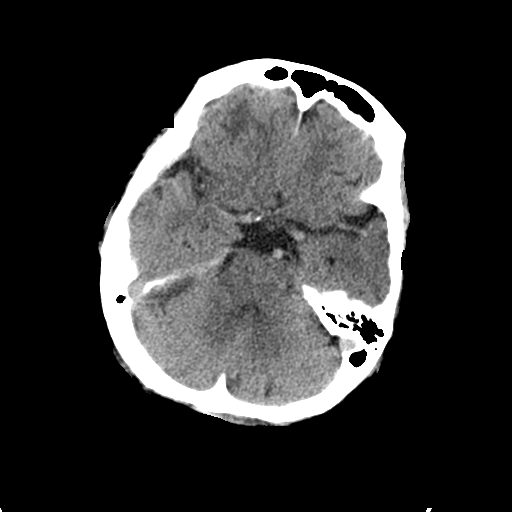
[im 11/31  brain]
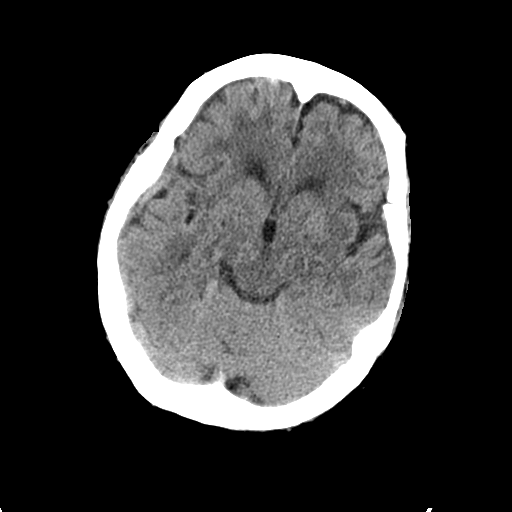
[im 14/31  brain]
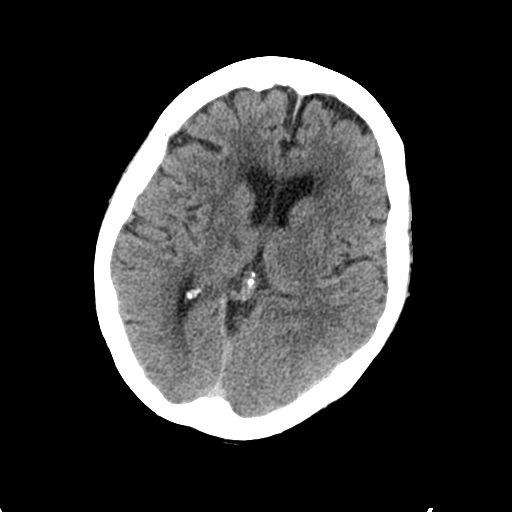
[im 14/31  bone]
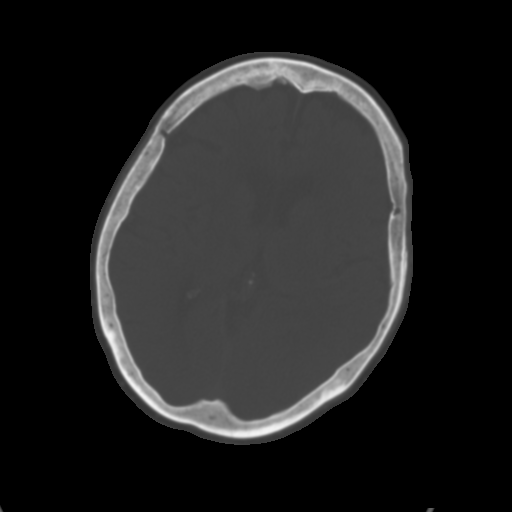
[im 17/31  brain]
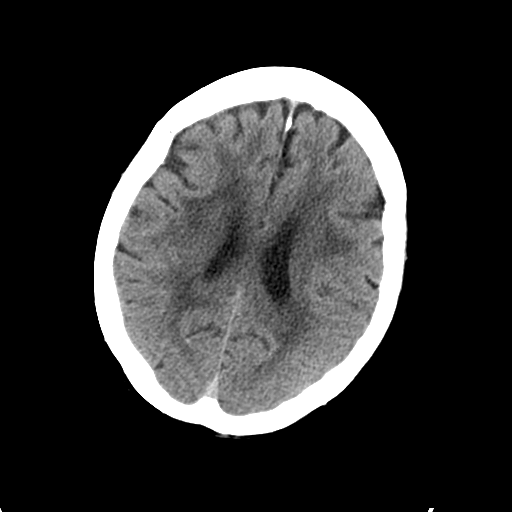
[im 20/31  brain]
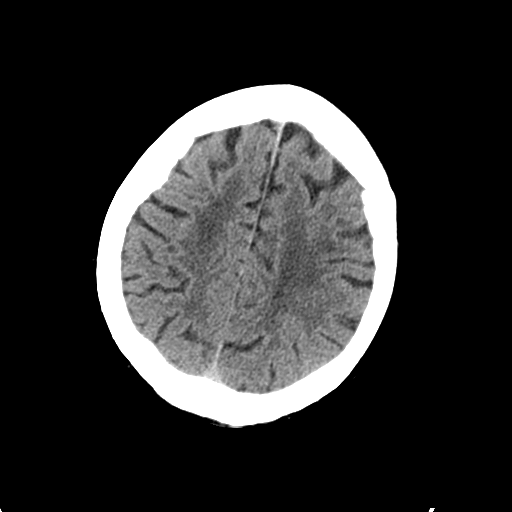
[im 23/31  brain]
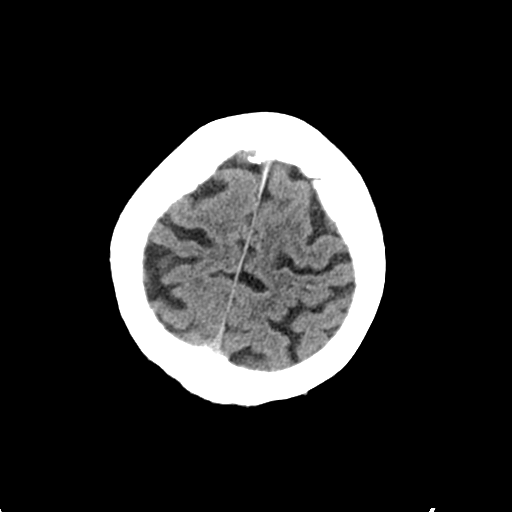
[im 25/31  brain]
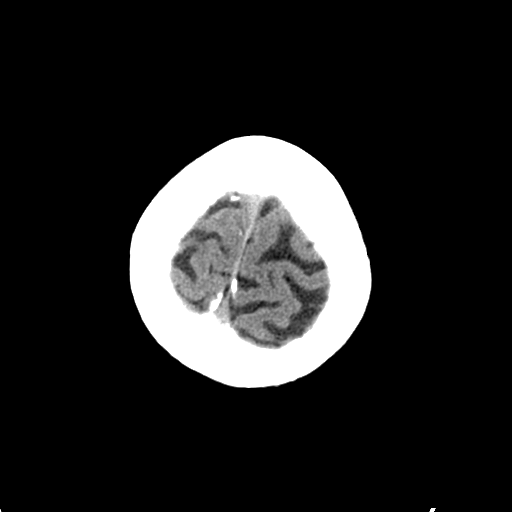
[im 25/31  bone]
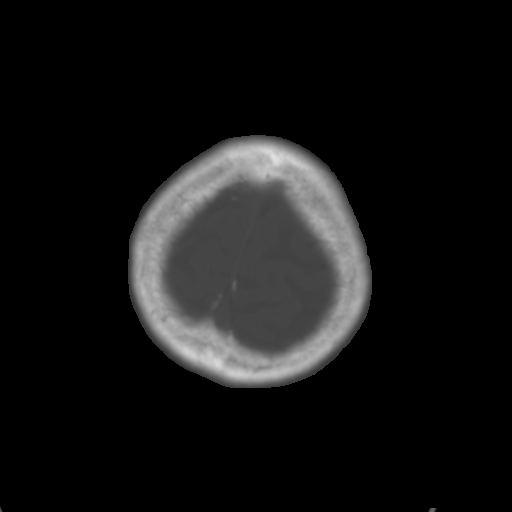
[im 28/31  brain]
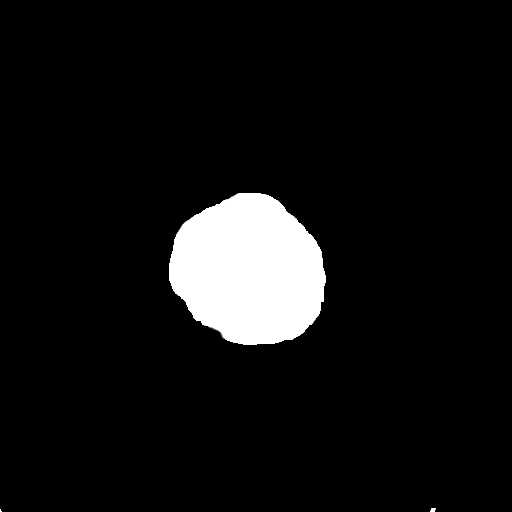

[Series 5: coronal soft tissue · coronal · 0.34mm/px · 3 of 62 slices shown]
[im 21/62  brain]
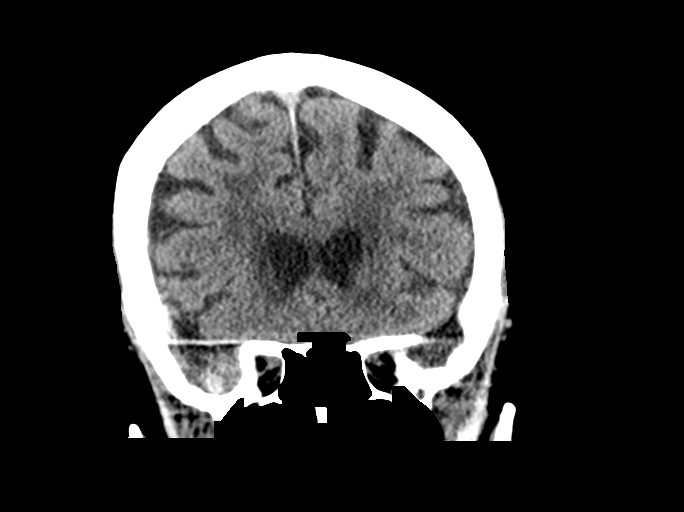
[im 28/62  brain]
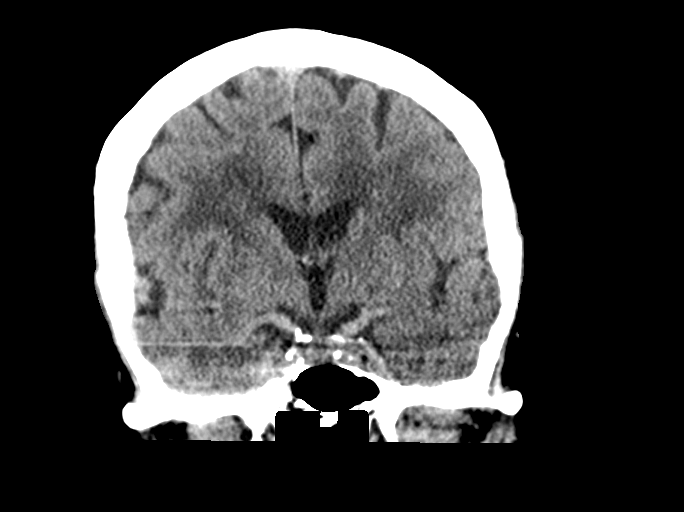
[im 34/62  brain]
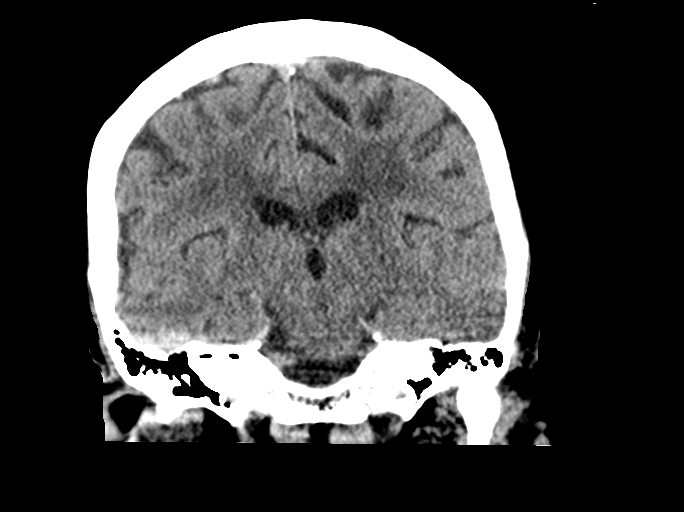

[13 of 47 positions shown; findings below may reference images not displayed]

FINDINGS: CT HEAD FINDINGS

Brain:

There is acute extra-axial hemorrhage along the inferolateral right
temporal lobe measuring up to 11 mm in thickness (for instance as
seen on series 5, image 33 and series 3, image 8). Additionally,
there is subtle hemorrhagic parenchymal contusion within the
anterior right temporal lobe (series 3, image 5) (series 5, image
21). Trace acute subdural hemorrhage is also questioned along the
right tentorium.

Hemorrhagic parenchymal contusions within the anteroinferior right
frontal lobe with overlying small volume acute subarachnoid
hemorrhage (greater on the right).

Stable moderate chronic small vessel ischemic changes within the
cerebral white matter. Redemonstrated chronic right thalamic lacunar
infarct. Stable mild generalized parenchymal atrophy.

No evidence of intracranial mass.

There is no hydrocephalus or midline shift.

Vascular: No hyperdense vessel.  Atherosclerotic calcifications.

Skull: There is a nondisplaced acute fracture within the left
occipital calvarium extending inferiorly to the level of the foramen
magnum.

Sinuses/Orbits: Visualized orbits show no acute finding. No
significant paranasal sinus disease or mastoid effusion at the
imaged levels.

CT CERVICAL SPINE FINDINGS

Alignment: Straightening of the expected cervical lordosis. Trace
C4-C5 grade 1 anterolisthesis. There is leftward rotation of C1 upon
C2, which may be related to patient head positioning at the time of
examination.

Skull base and vertebrae: The basion-dental and atlanto-dental
intervals are maintained.No evidence of acute fracture to the
cervical spine. Congenital nonunion of the posterior arch of C1.

Soft tissues and spinal canal: No prevertebral fluid or swelling. No
visible canal hematoma.

Disc levels: Mild for age cervical spondylosis. No high-grade bony
spinal canal narrowing.

Upper chest: No consolidation within the imaged lung apices. No
visible pneumothorax.

These results were called by telephone at the time of interpretation
on 11/04/2019 at [DATE] to provider YOEL TIGER , who verbally
acknowledged these results.
IMPRESSION: CT head:

1. Hemorrhagic parenchymal contusions within the anteroinferior
right frontal lobe with overlying small volume acute subarachnoid
hemorrhage.
2. Subtle hemorrhagic parenchymal contusion is also present within
the anterior right temporal lobe.
3. Acute extra-axial hemorrhage overlying the inferolateral right
temporal lobe measuring up to 11 mm in thickness. Additionally,
there is suspected trace acute subdural hemorrhage along the right
tentorium.
4. Acute nondisplaced fracture of the left occipital calvarium
extending inferiorly to the level of the foramen magnum.
5. Unchanged mild generalized parenchymal atrophy with moderate
chronic small vessel ischemic disease. Redemonstrated chronic right
thalamic lacunar infarct.

CT cervical spine:

1. No evidence of acute fracture to the cervical spine.
2. Leftward rotation of C1 upon C2, which may be related to patient
head positioning at the time of examination. Clinical correlation is
recommended.
3. Mild for age cervical spondylosis.
4. Mild C4-C5 grade 1 anterolisthesis.

## 2022-05-20 IMAGING — CT CT L SPINE W/O CM
3 series · 13 of 33 positions shown, 16 images · non-contrast
Comparison: CT lumbar spine 09/10/2007. Lumbar MRI 10/27/2016.
Lumbar radiographs 09/04/2019.

CLINICAL DATA: Low back and left leg pain after falling. History of
spinal augmentation.

EXAM:
CT LUMBAR SPINE WITHOUT CONTRAST
TECHNIQUE: Multidetector CT imaging of the lumbar spine was performed without
intravenous contrast administration. Multiplanar CT image
reconstructions were also generated.

[Series 4: l spine st · axial · 0.30mm/px · z∈[+1067,+1213]mm · 5 of 107 slices shown, 7 images]
[im 17/107  soft-tissue]
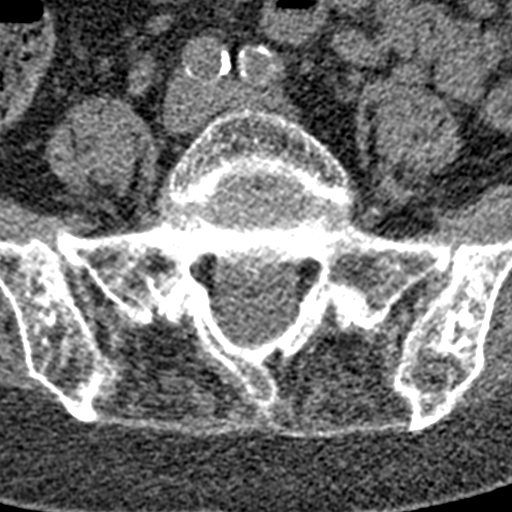
[im 17/107  bone]
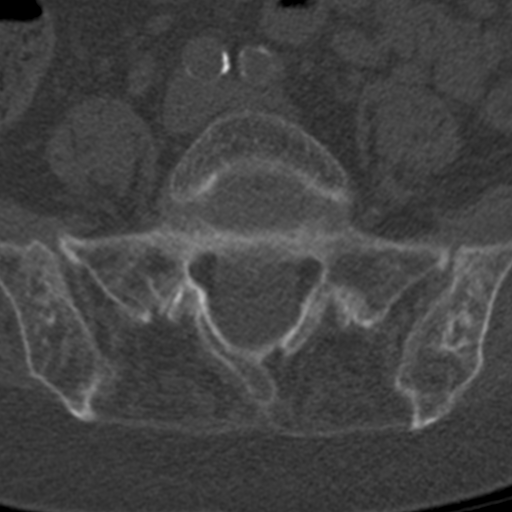
[im 33/107  bone]
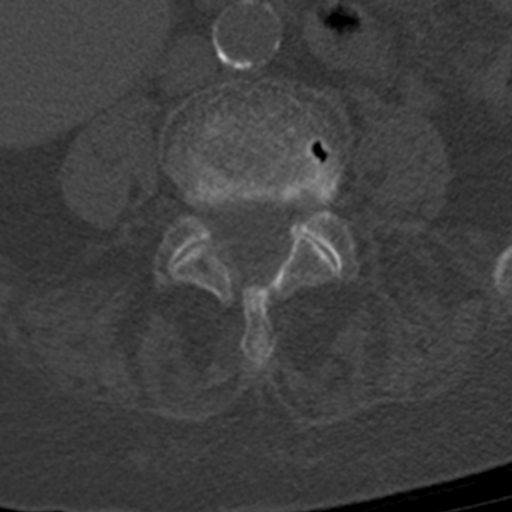
[im 58/107  bone]
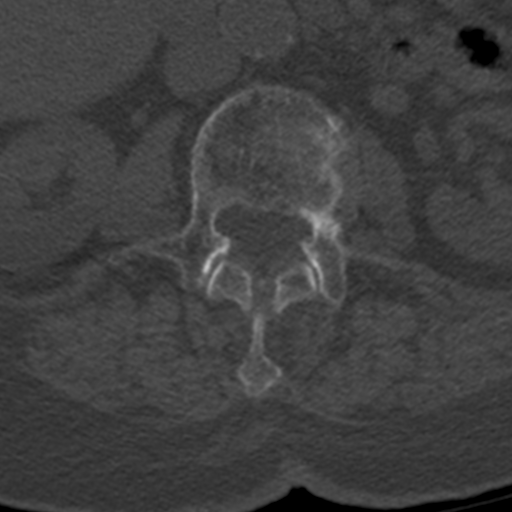
[im 74/107  bone]
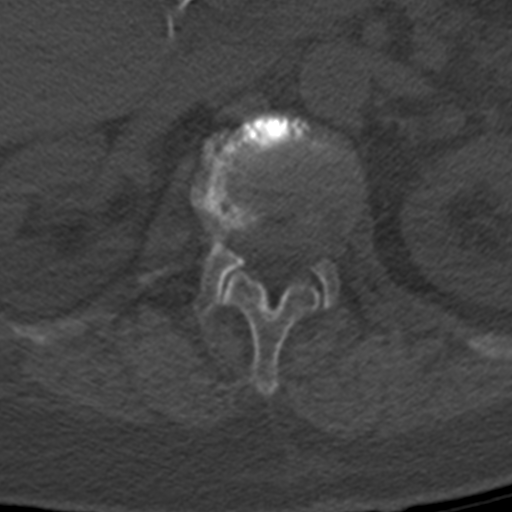
[im 90/107  soft-tissue]
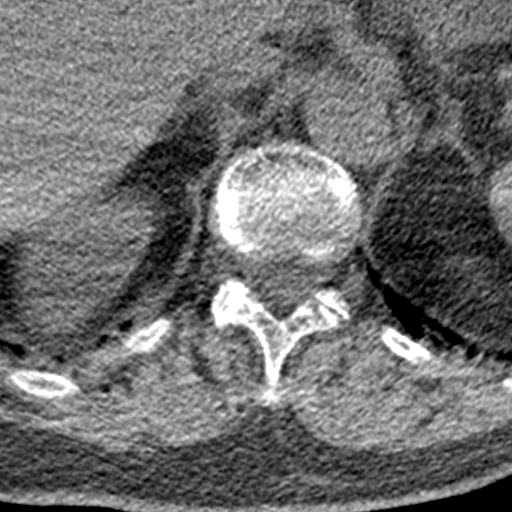
[im 90/107  bone]
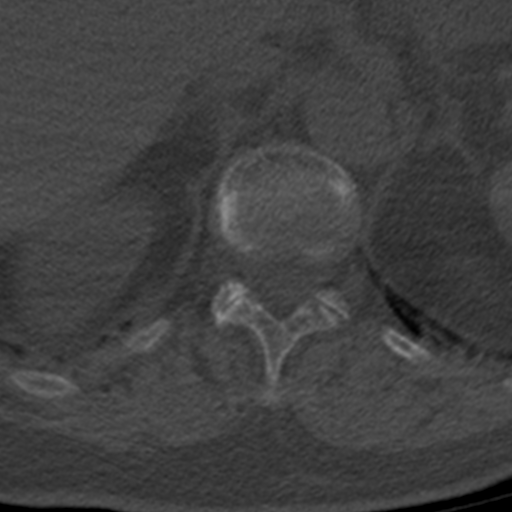

[Series 8: coronal bone · coronal · 0.31mm/px · 3 of 78 slices shown]
[im 16/78  bone]
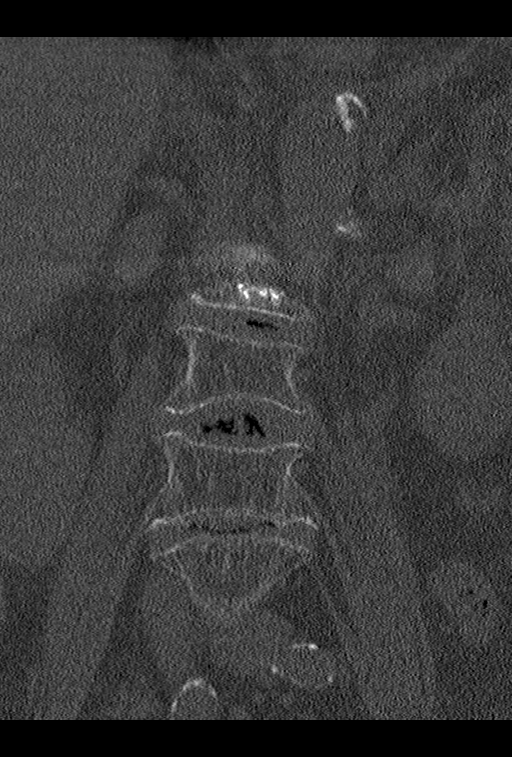
[im 31/78  bone]
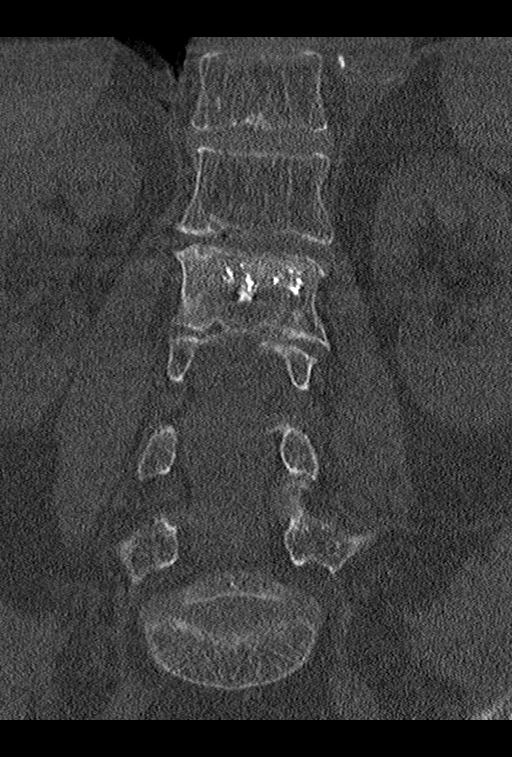
[im 47/78  bone]
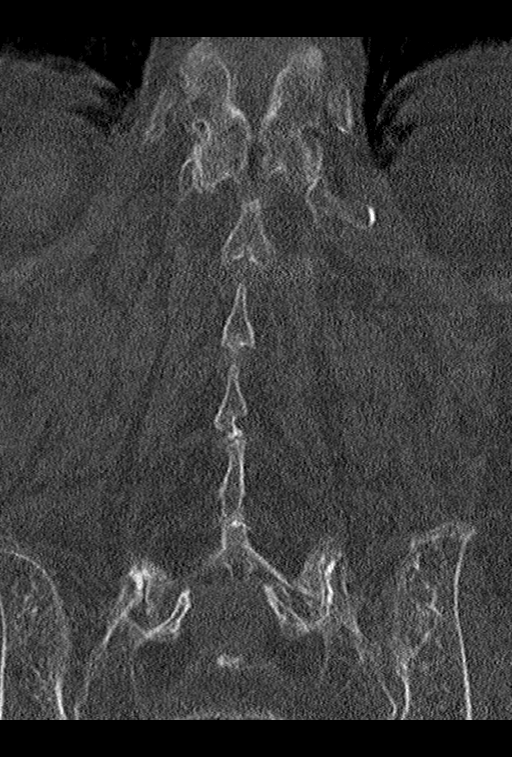

[Series 10: sagittal st · sagittal · 0.36mm/px · 5 of 74 slices shown, 6 images]
[im 25/74  bone]
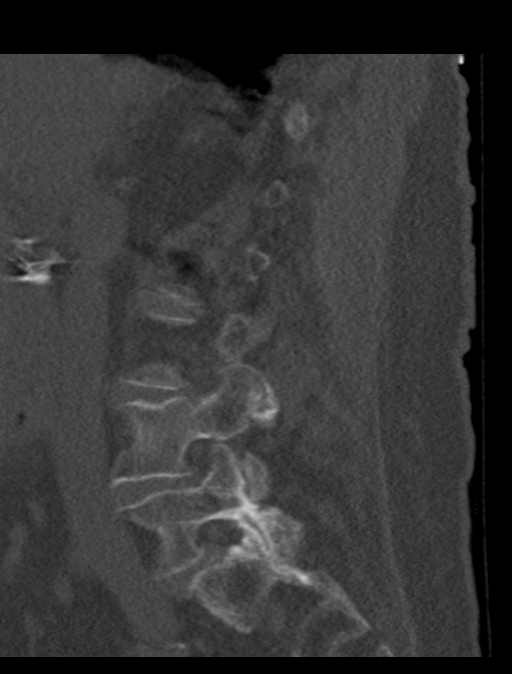
[im 31/74  bone]
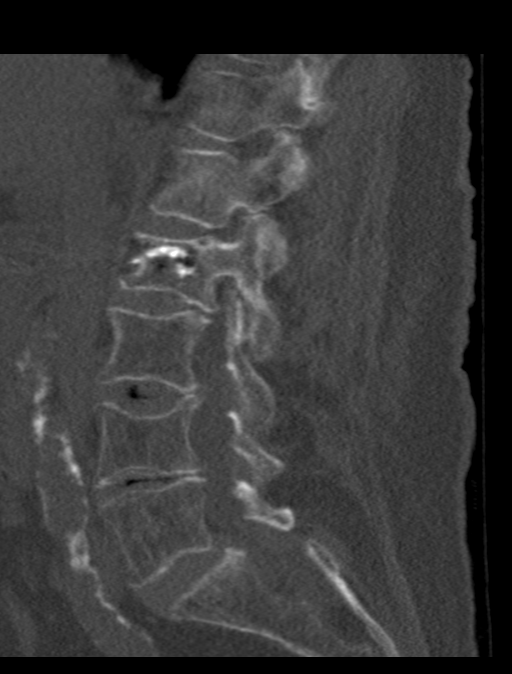
[im 37/74  soft-tissue]
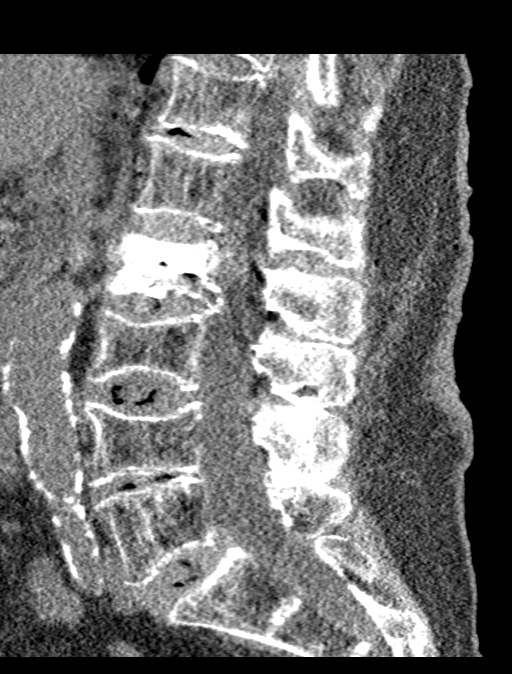
[im 37/74  bone]
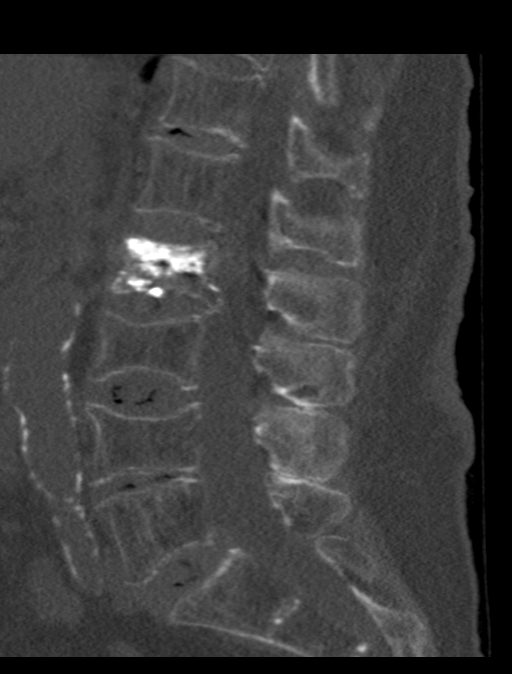
[im 43/74  bone]
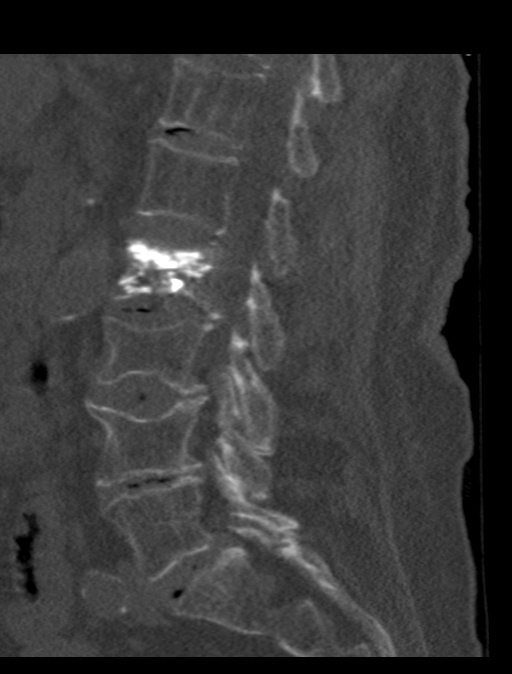
[im 49/74  bone]
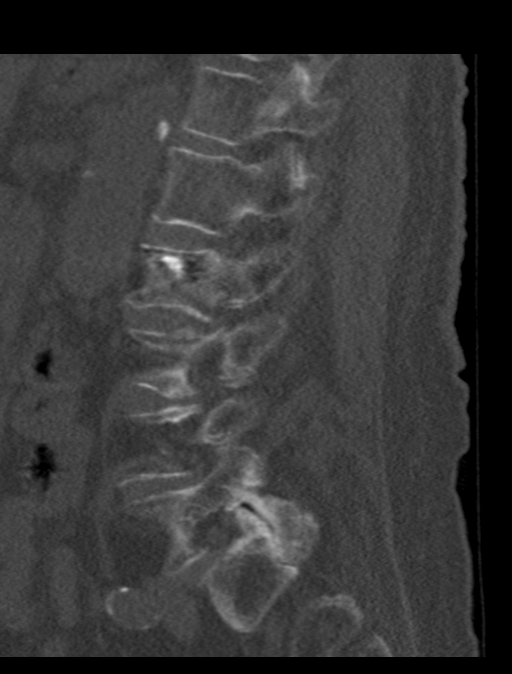

[13 of 33 positions shown; findings below may reference images not displayed]

FINDINGS: Segmentation: There are 5 lumbar type vertebral bodies. The bones
are severely demineralized.

Alignment: Normal.

Vertebrae: No definite acute osseous findings are seen. The L2
vertebral body has a stable appearance post spinal augmentation.
Cement extension into the L2-3 disc is unchanged. There are grossly
stable biconcave L3 and superior endplate compression deformities at
L4.

Paraspinal and other soft tissues: No acute paraspinal findings.
Diffuse aortic and branch vessel atherosclerosis and hepatomegaly
are noted.

Disc levels: No large disc herniation or acute disc space findings
are identified. Chronic osseous retropulsion at the L2 compression
fracture appears stable. There is stable disc bulging, endplate
osteophytes and facet hypertrophy in the lower lumbar spine,
contributing to stable mild foraminal narrowing.
IMPRESSION: 1. No acute osseous findings in the lumbar spine.
2. Stable chronic compression deformities at L2, L3 and L4.
3. Aortic Atherosclerosis ([id number]).

## 2022-05-20 IMAGING — CR DG FEMUR 2+V*L*
4 series · 4 of 4 positions shown · non-contrast
Comparison: None.

CLINICAL DATA: Left leg pain secondary to a fall today.

EXAM:
LEFT FEMUR 2 VIEWS

[x femur proximal ap left]
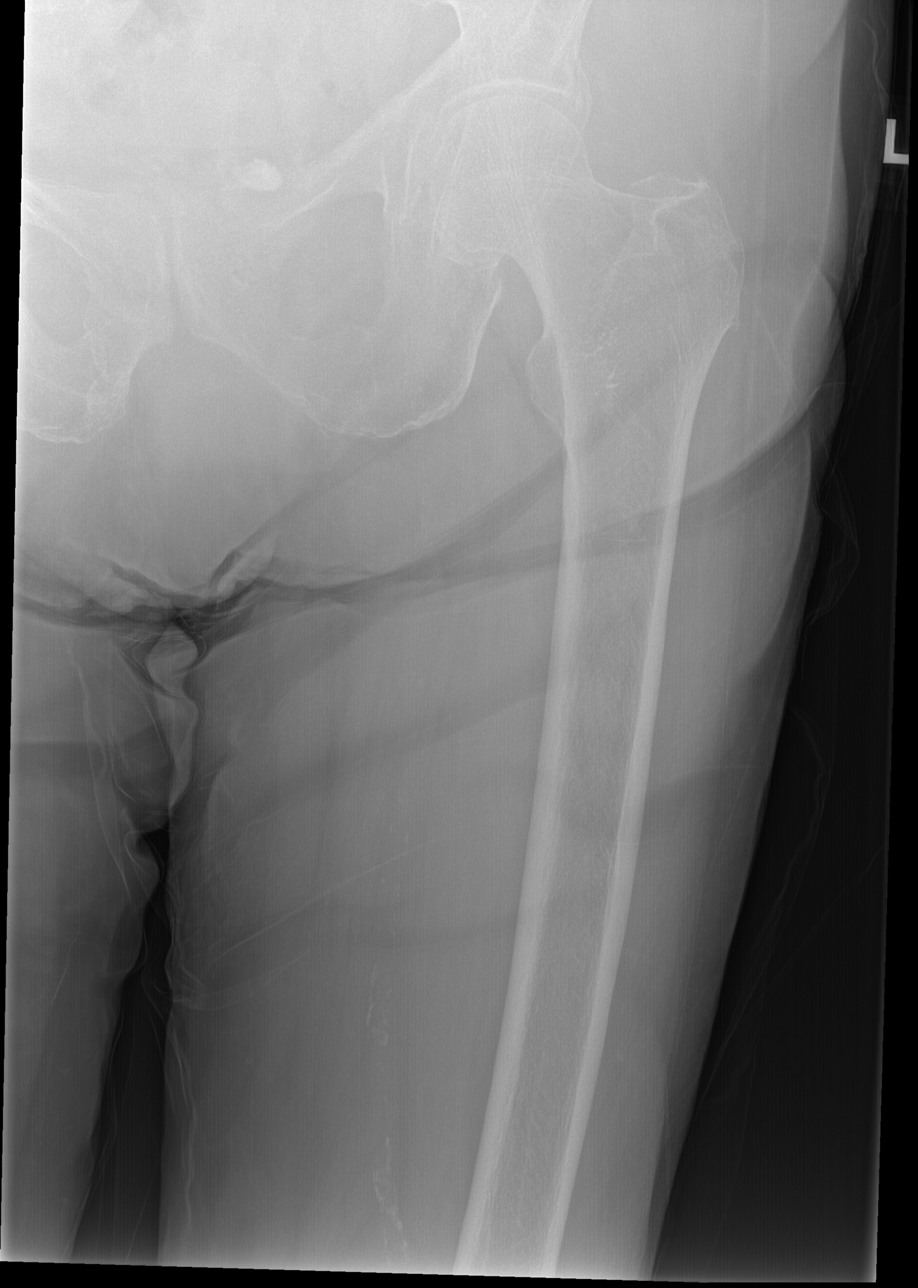

[x femur distal ap left]
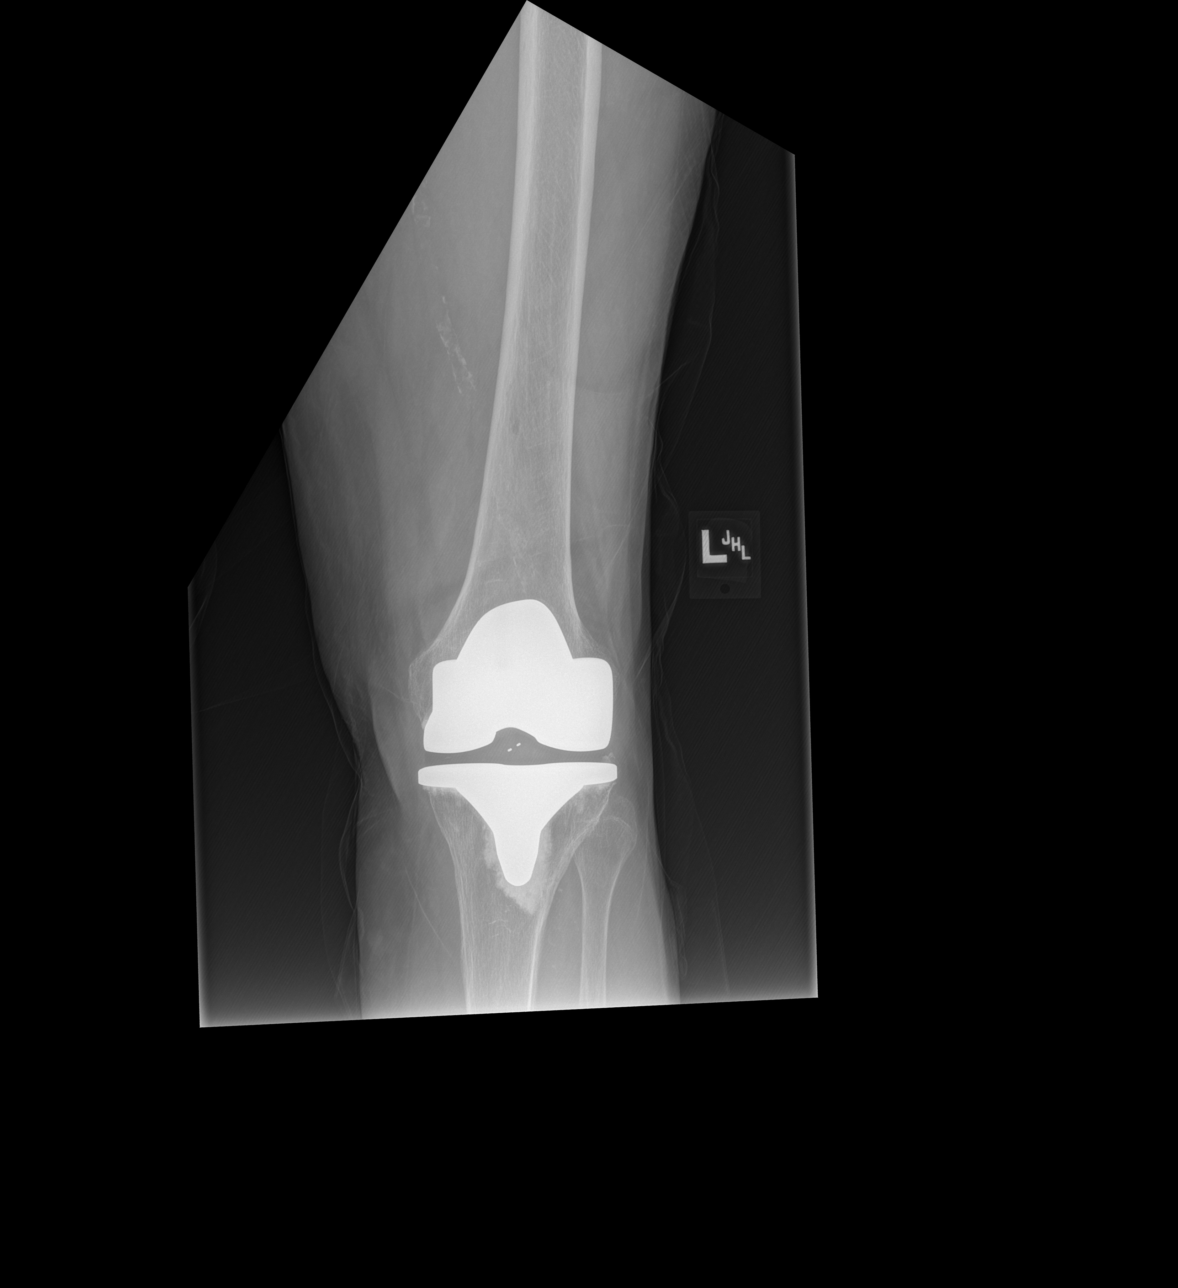

[w hip lat left]
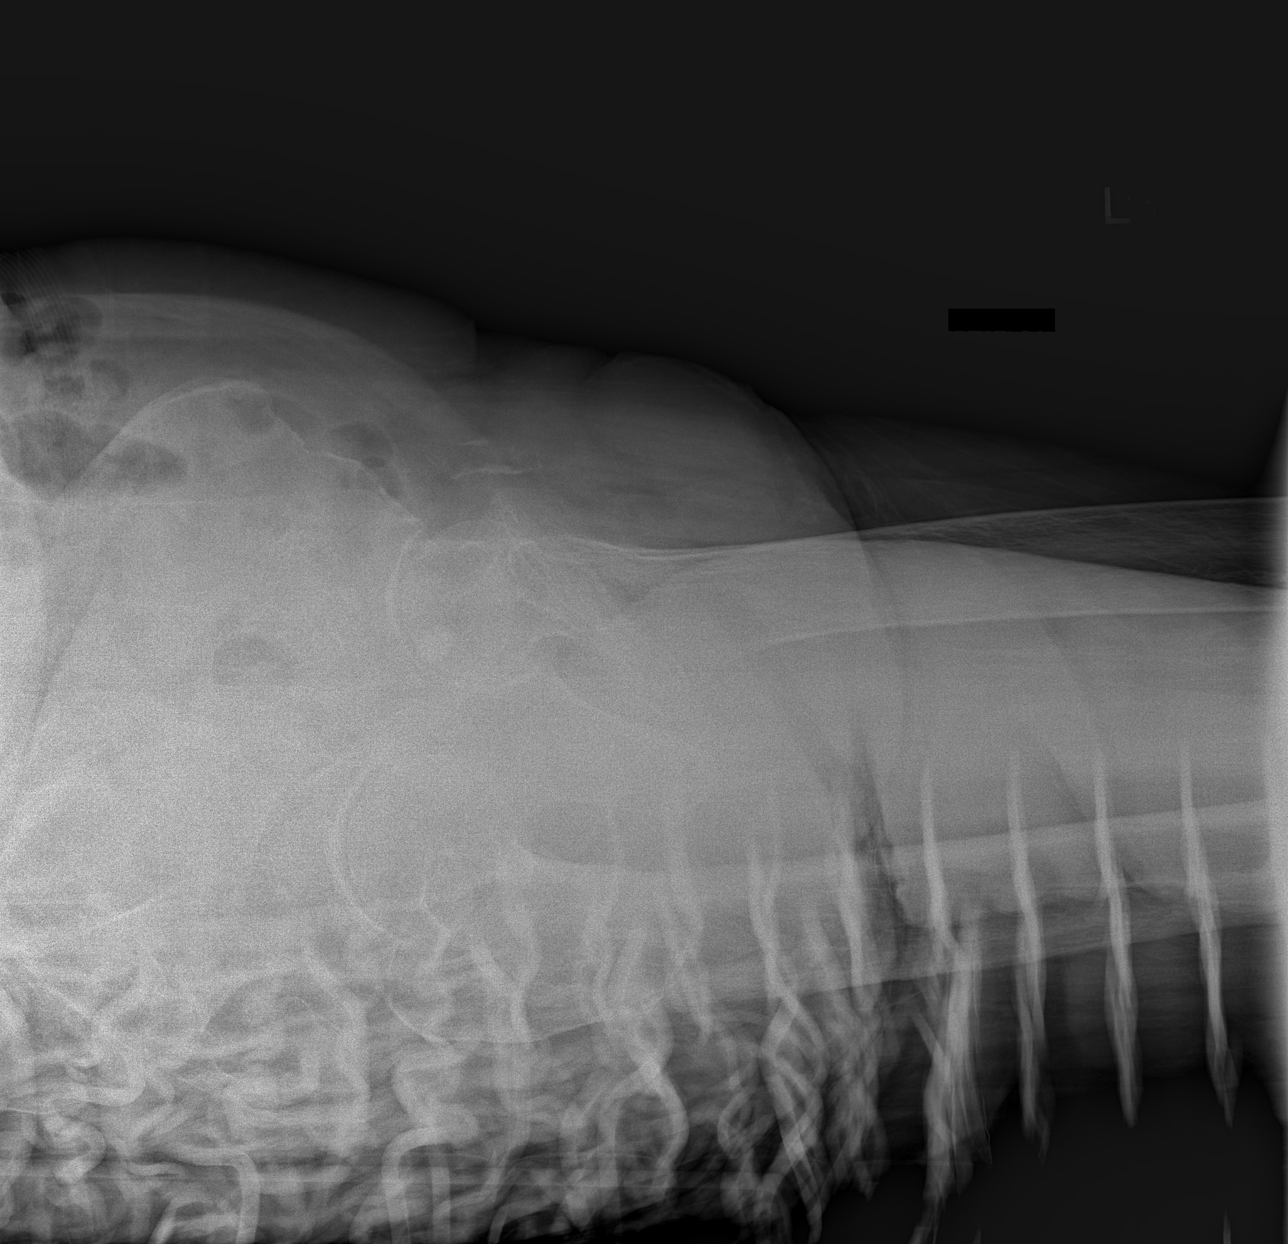

[x femur distal lat left]
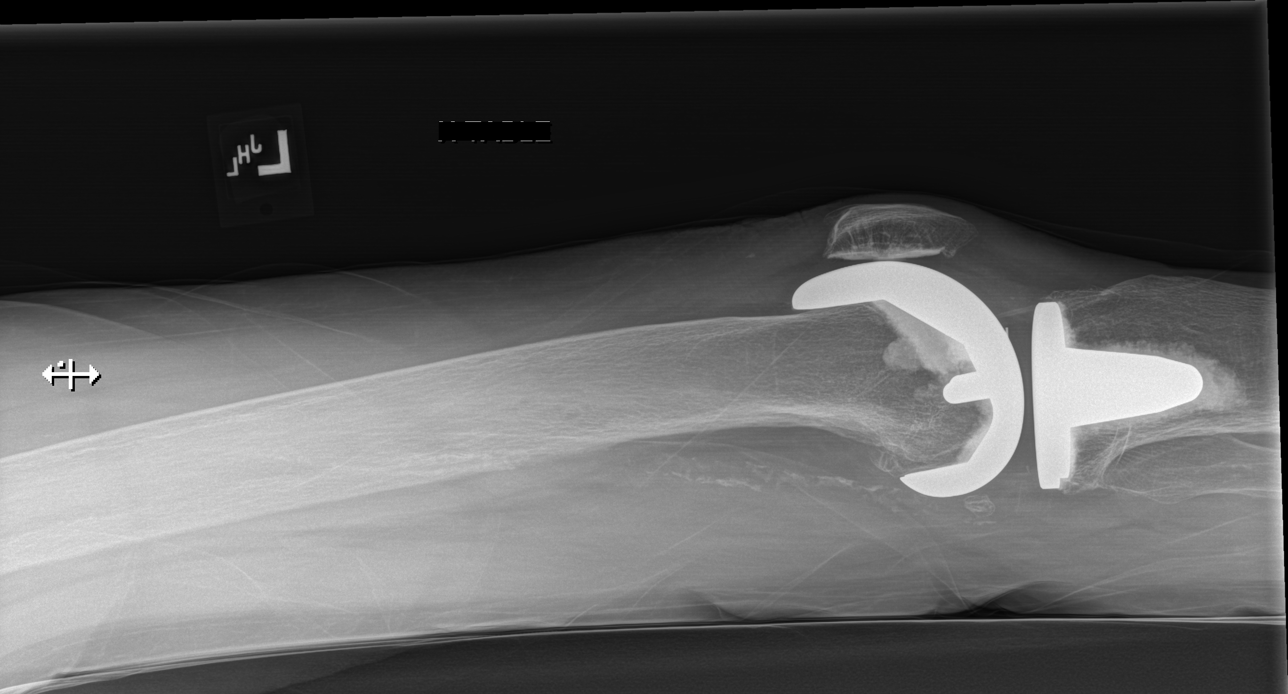

[4 of 4 positions shown; findings below may reference images not displayed]

FINDINGS: There is no evidence of fracture or other focal bone lesions. Left
knee arthroplasty. No evidence of loosening of the components. Soft
tissues are unremarkable.
IMPRESSION: No acute abnormality.

## 2022-05-21 IMAGING — CT CT T SPINE W/O CM
3 of 6 series · 13 of 35 positions shown, 15 images · non-contrast
Comparison: MRI thoracic spine 09/14/2019.

CLINICAL DATA: Status post fall. History of prior compression
fractures and vertebral augmentation.

EXAM:
CT THORACIC SPINE WITHOUT CONTRAST
TECHNIQUE: Multidetector CT images of the thoracic were obtained using the
standard protocol without intravenous contrast.

[Series 9: orthogonal bone · axial · 0.21mm/px · z∈[+1158,+1326]mm · 5 of 163 slices shown, 7 images]
[im 28/163  soft-tissue]
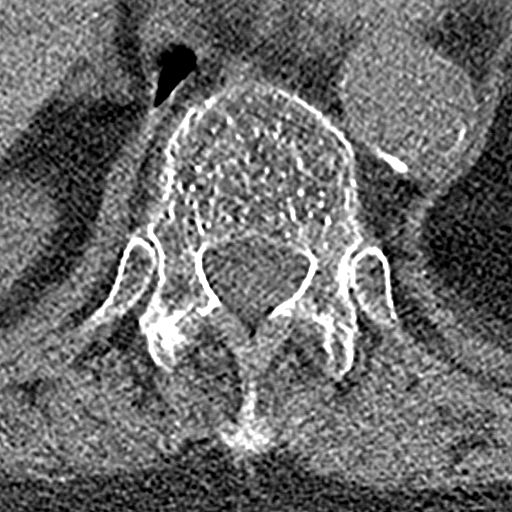
[im 28/163  bone]
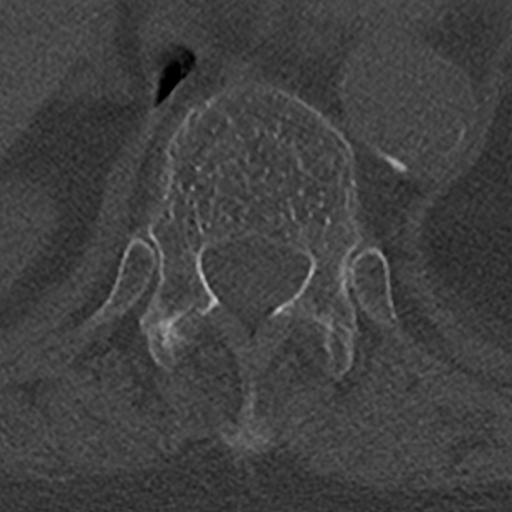
[im 55/163  bone]
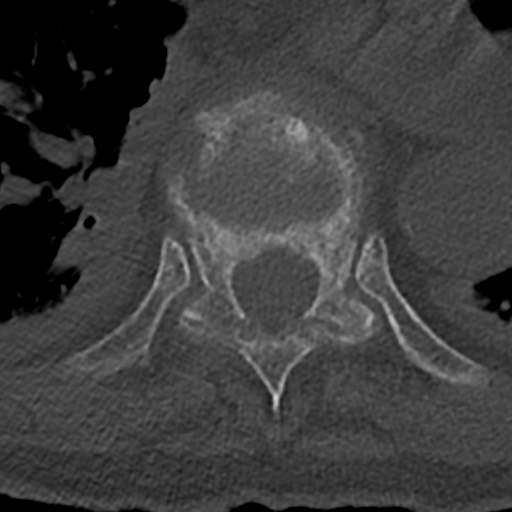
[im 82/163  bone]
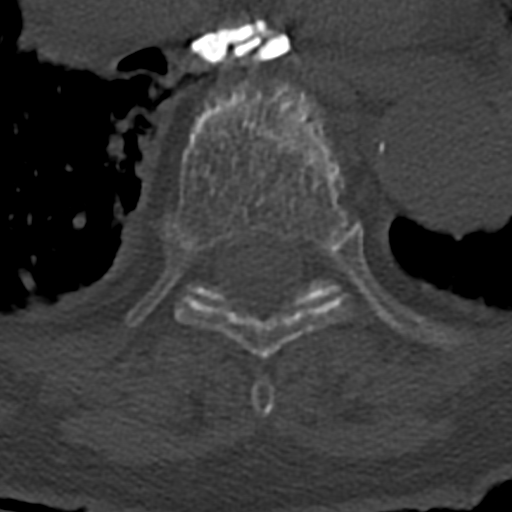
[im 109/163  bone]
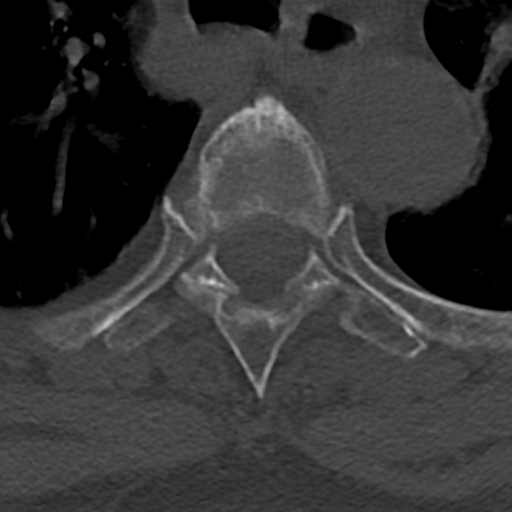
[im 136/163  soft-tissue]
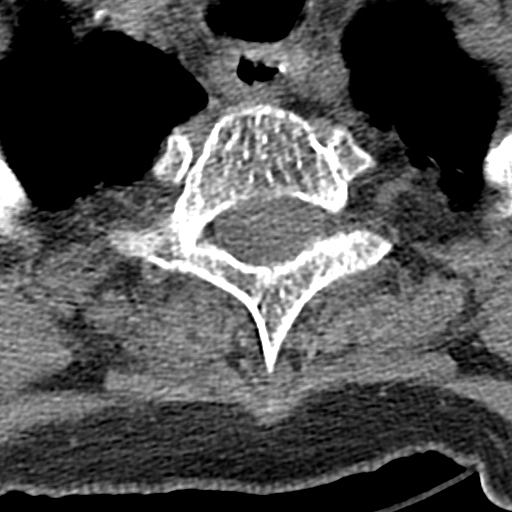
[im 136/163  bone]
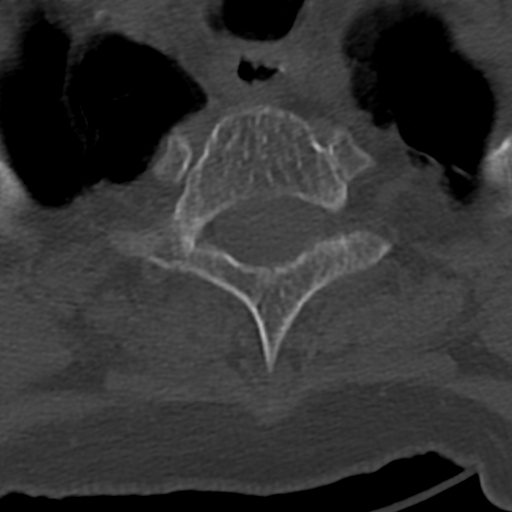

[Series 11: sag st · sagittal · 0.36mm/px · 6 of 251 slices shown]
[im 59/251  bone]
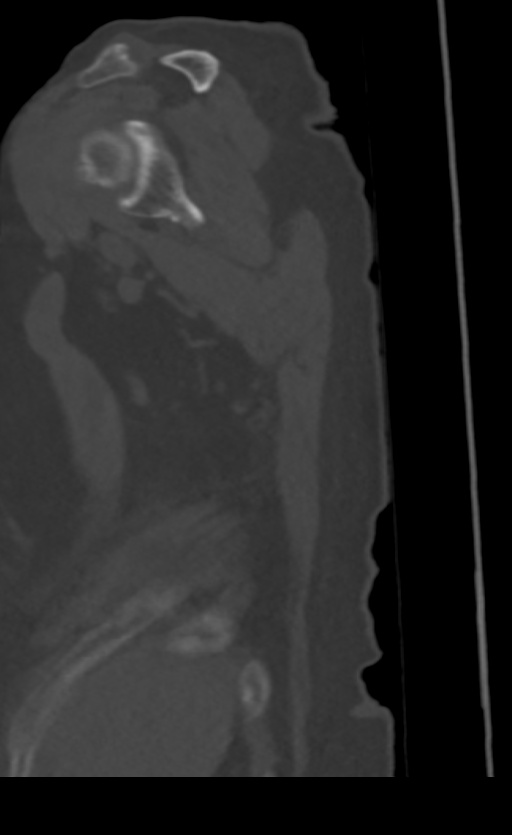
[im 86/251  bone]
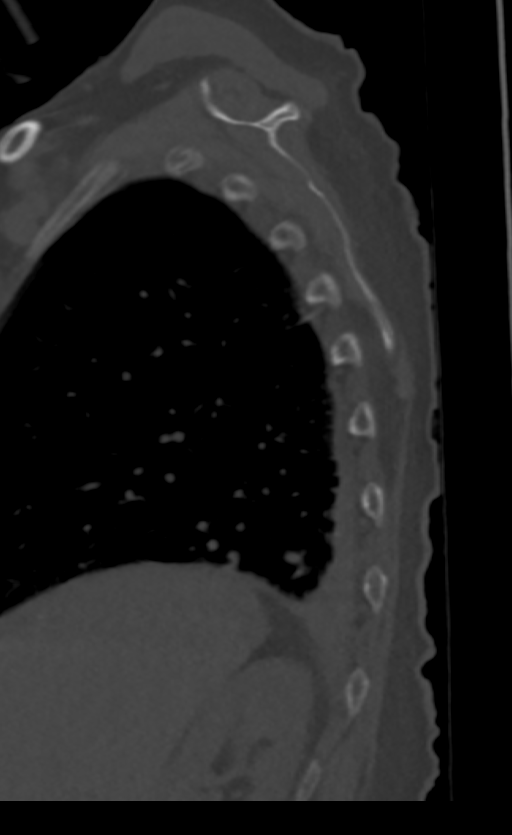
[im 111/251  soft-tissue]
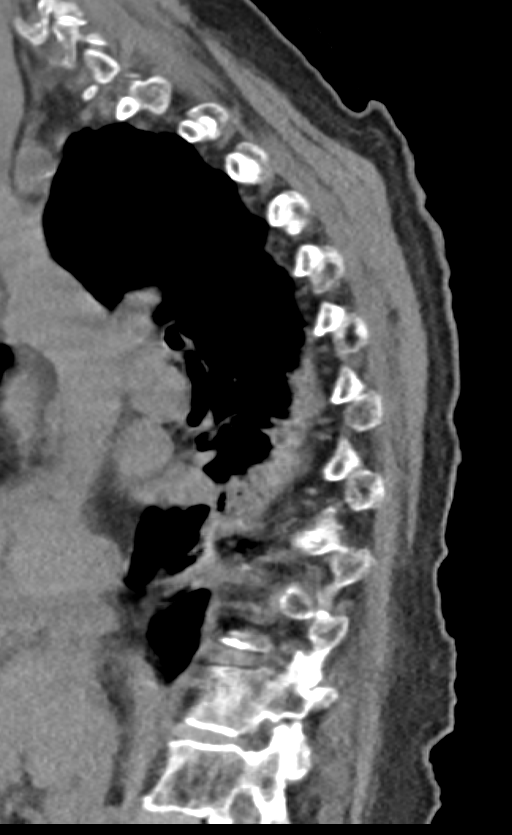
[im 112/251  bone]
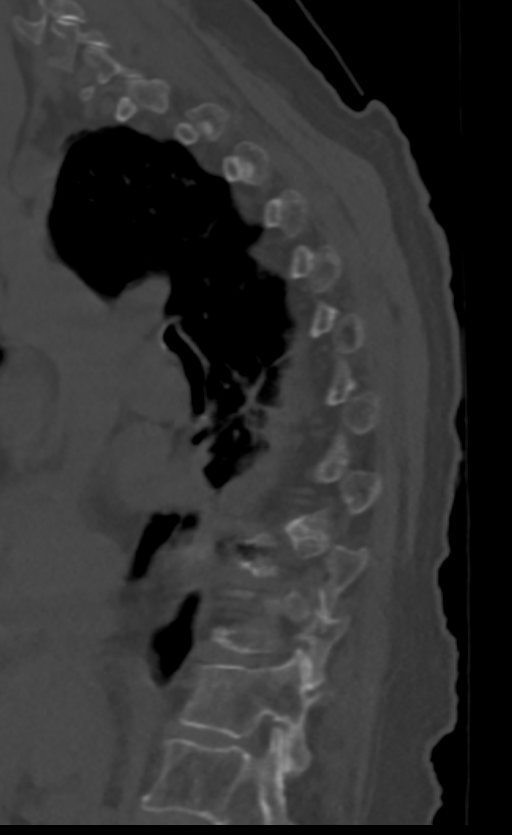
[im 139/251  bone]
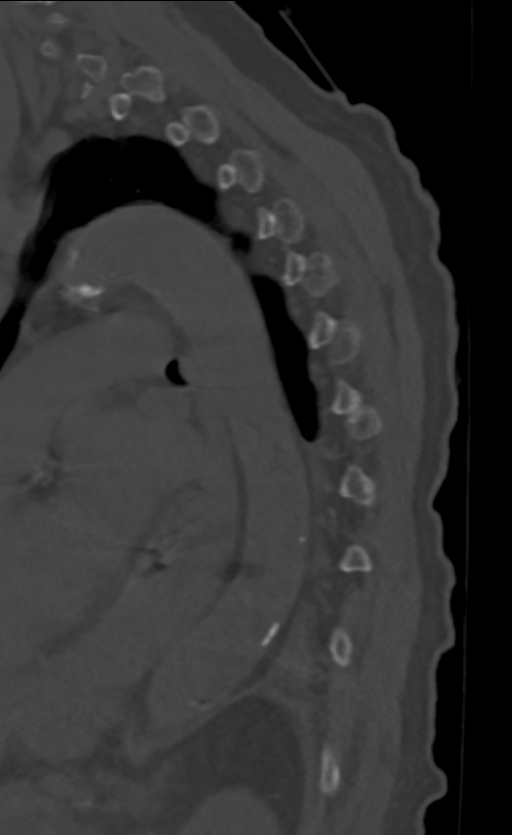
[im 165/251  bone]
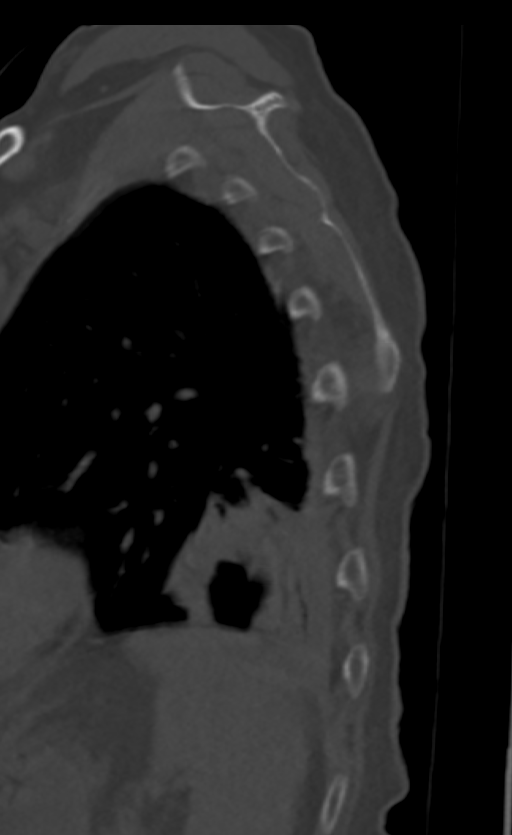

[Series 12: cor st · coronal · 0.73mm/px · 2 of 137 slices shown]
[im 46/137  bone]
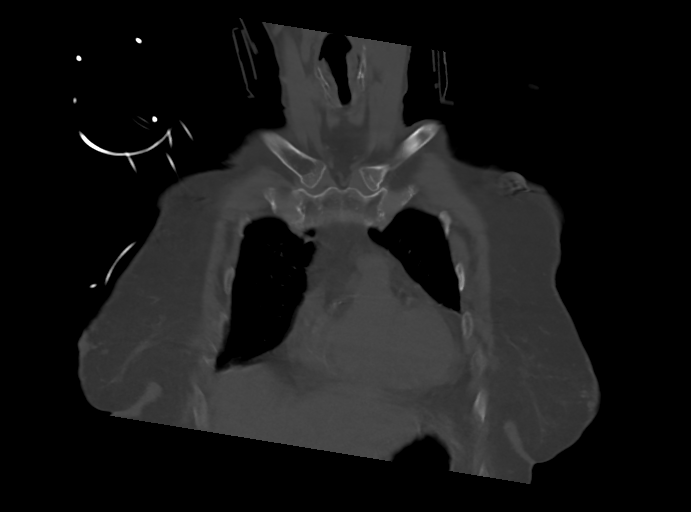
[im 91/137  bone]
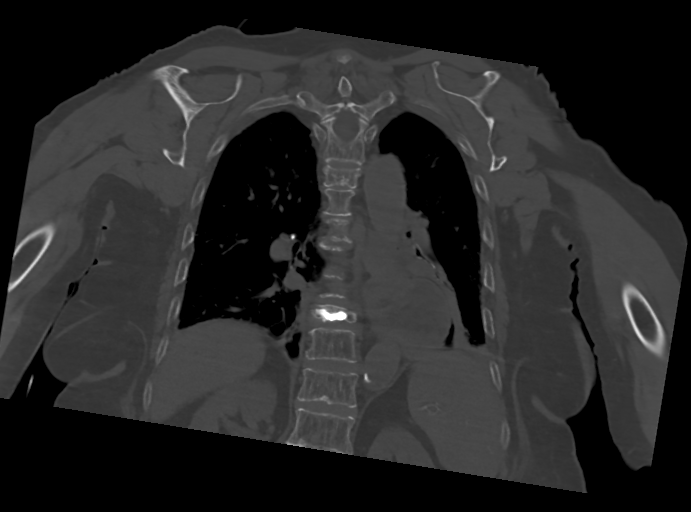

[13 of 35 positions shown; findings below may reference images not displayed]

FINDINGS: Alignment: Maintained.

Vertebrae: The patient has suffered a compression fracture of T4
with vertebral body height loss of approximately 60% since the prior
study. Mild bony retropulsion off the mid aspect of T4 is identified
but the central canal appears open. The patient has undergone
vertebral augmentation for the T9 fracture seen on the prior exam.
No lytic or sclerotic lesion is identified. Bones are osteopenic.
Degenerative endplate signal change anteriorly at T6-7 noted.

Paraspinal and other soft tissues: Dependent atelectasis is seen.
Calcific aortic and coronary atherosclerosis is noted. Calcified
mediastinal and hilar lymph nodes consistent with old granulomatous
disease are seen. The patient has a hiatal hernia.

Disc levels: The central canal and foramina appear open at all
levels.
IMPRESSION: Acute or subacute T4 compression fracture with vertebral body height
loss of approximately 60% is new since the comparison MRI. Mild bony
retropulsion off the central aspect of T4 is noted but no central
canal stenosis is seen. The fracture has an appearance most
consistent with a senile osteoporotic injury.

Status post T9 vertebral augmentation since the prior exam without
complicating feature.

Marked osteopenia.

Calcific aortic and coronary atherosclerosis.

Hiatal hernia.

## 2022-05-21 IMAGING — DX DG CHEST 1V PORT
1 series · 1 of 1 positions shown · non-contrast
Comparison: CT of the chest 11/04/2019

CLINICAL DATA: Acute respiratory failure.

EXAM:
PORTABLE CHEST 1 VIEW

[chest]
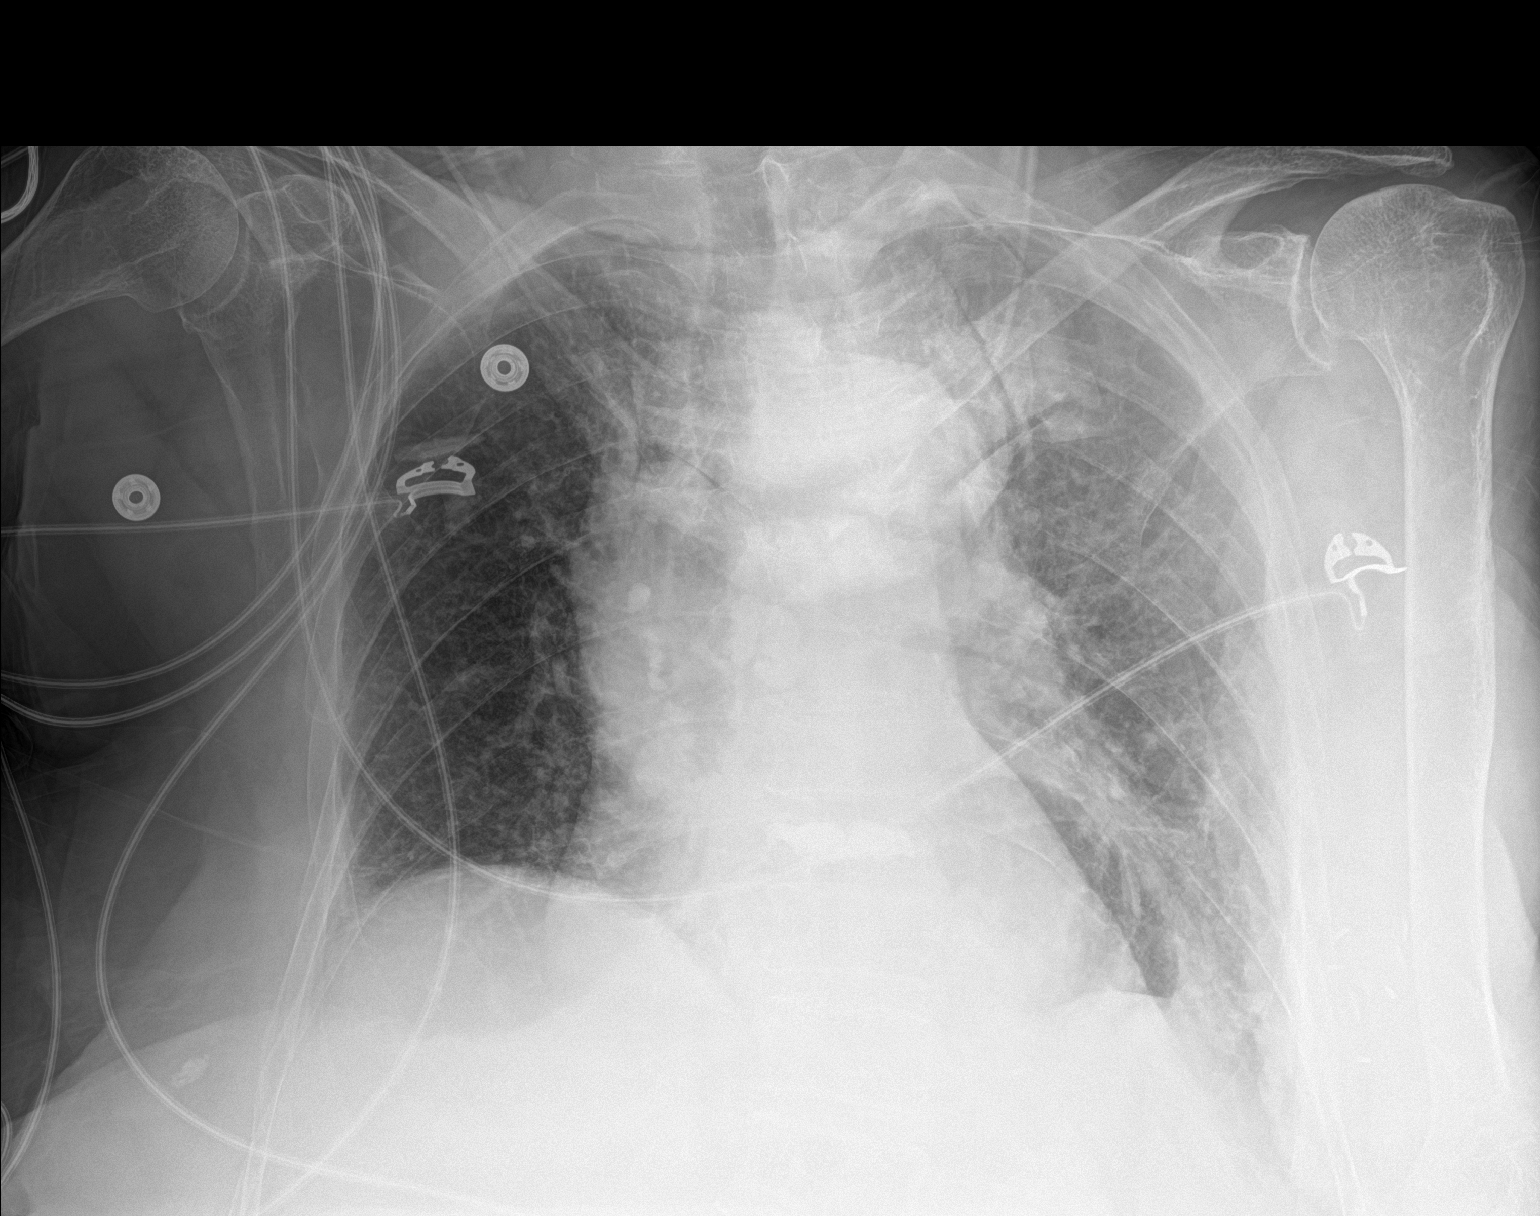

[1 of 1 positions shown; findings below may reference images not displayed]

FINDINGS: Heart is mildly enlarged. Increasing asymmetric left-sided edema and
pulmonary vascular congestion is present. Left basilar airspace
opacity is noted.
IMPRESSION: 1. Increasing asymmetric left-sided edema and pulmonary vascular
congestion.
2. Left basilar airspace disease.

## 2022-05-21 IMAGING — CT CT HEAD W/O CM
5 of 8 series · 15 of 47 positions shown, 16 images · non-contrast
Comparison: CT head without contrast 11/04/2019.

CLINICAL DATA: Intracranial hemorrhage.  Subdural hemorrhage.

EXAM:
CT HEAD WITHOUT CONTRAST
TECHNIQUE: Contiguous axial images were obtained from the base of the skull
through the vertex without intravenous contrast.

[Series 3: head without · axial · non-contrast · 0.47mm/px · z∈[-138,-83]mm · 2 of 34 slices shown, 3 images (1 of 2)]
[im 12/34  brain]
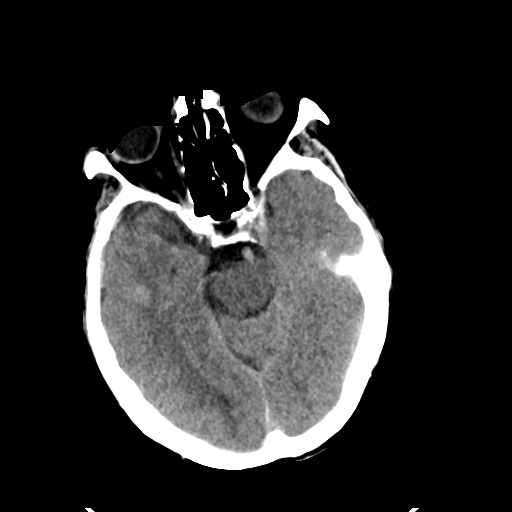
[im 12/34  bone]
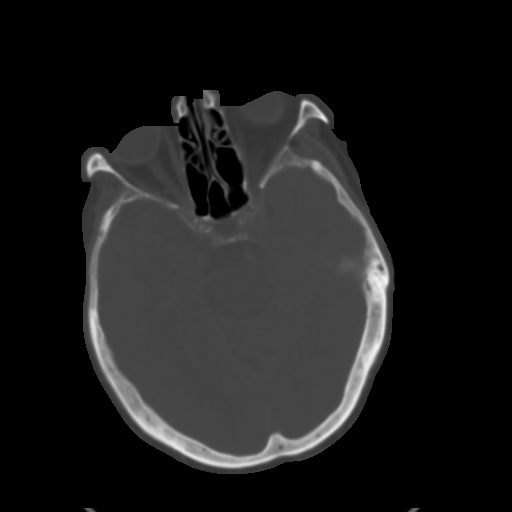
[im 23/34  brain]
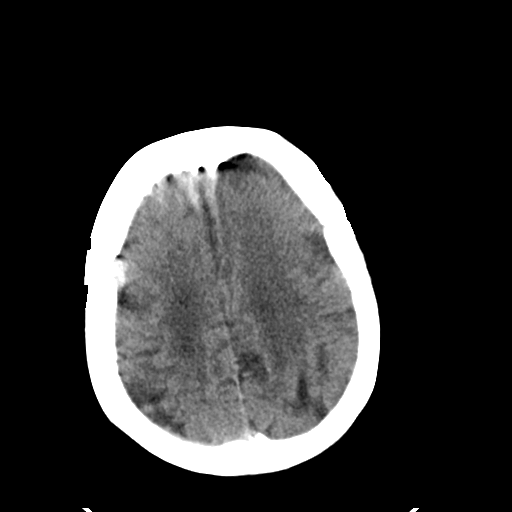

[Series 4: head bone · axial · 0.47mm/px · z∈[-177,-93]mm · 5 of 85 slices shown]
[im 9/85  bone]
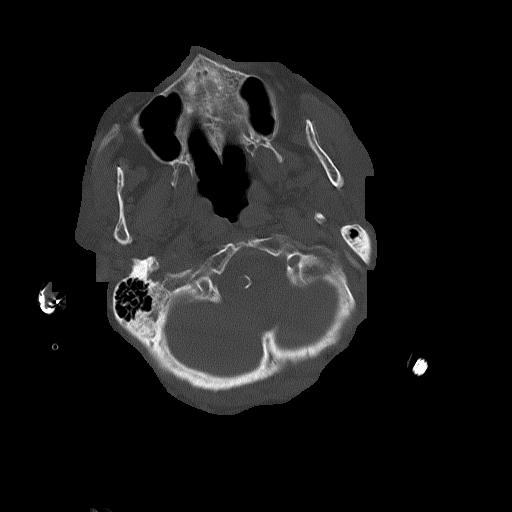
[im 17/85  bone]
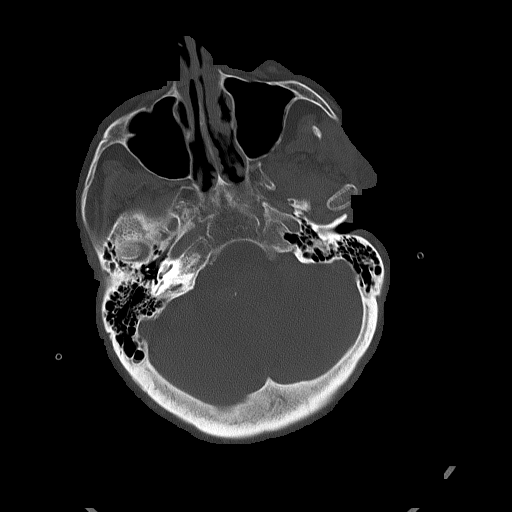
[im 26/85  bone]
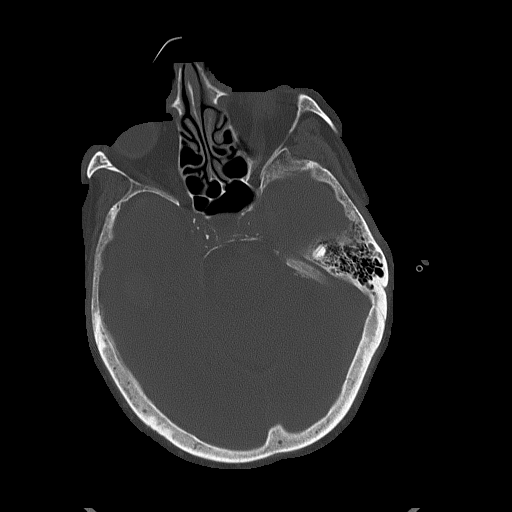
[im 34/85  bone]
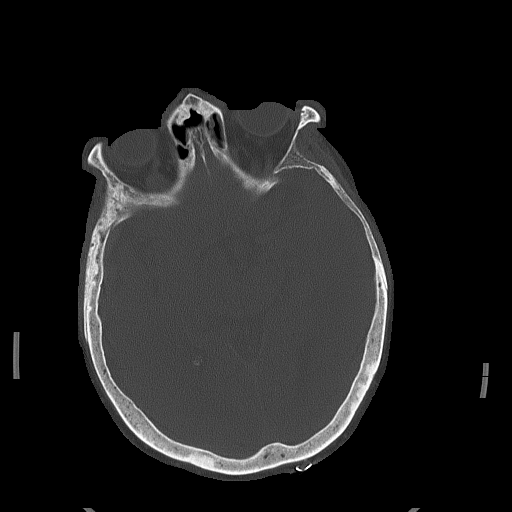
[im 51/85  bone]
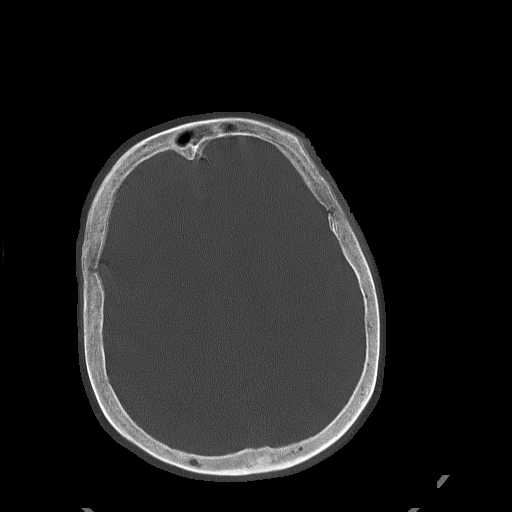

[Series 5: head without · axial · non-contrast · 0.50mm/px · z∈[-138,-83]mm · 2 of 34 slices shown (2 of 2)]
[im 12/34  brain]
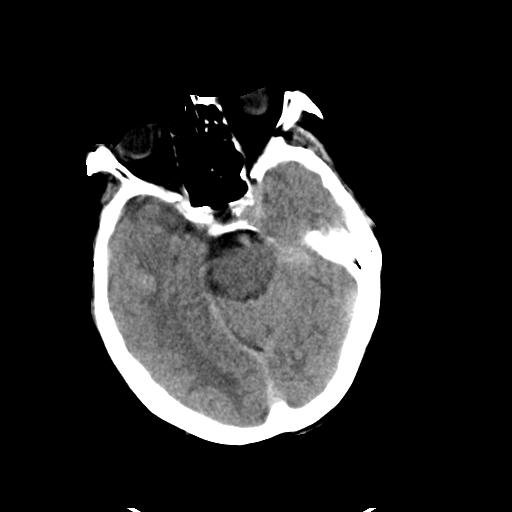
[im 23/34  brain]
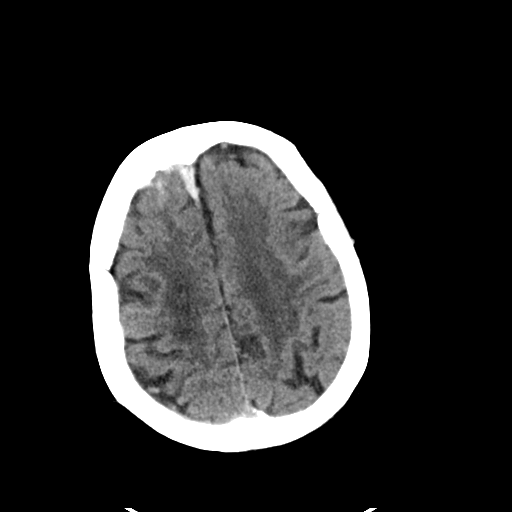

[Series 8: head without sag · sagittal · non-contrast · 0.33mm/px · 3 of 67 slices shown]
[im 5/67  brain]
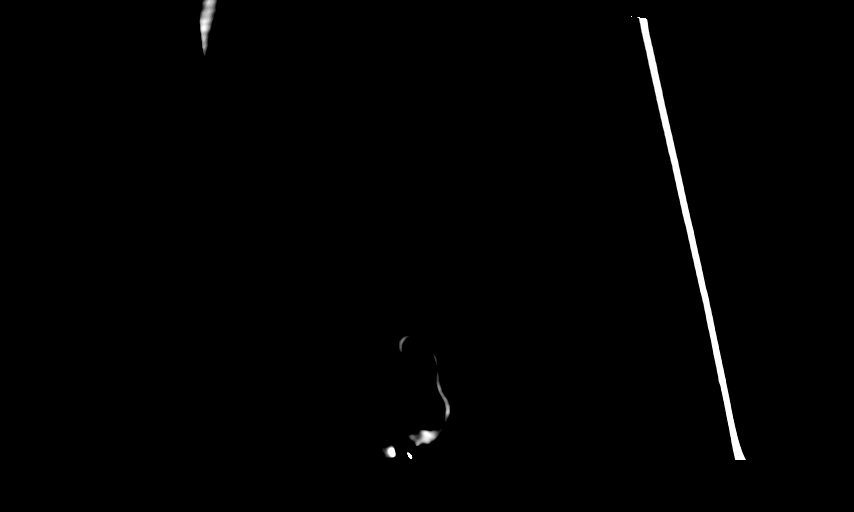
[im 25/67  brain]
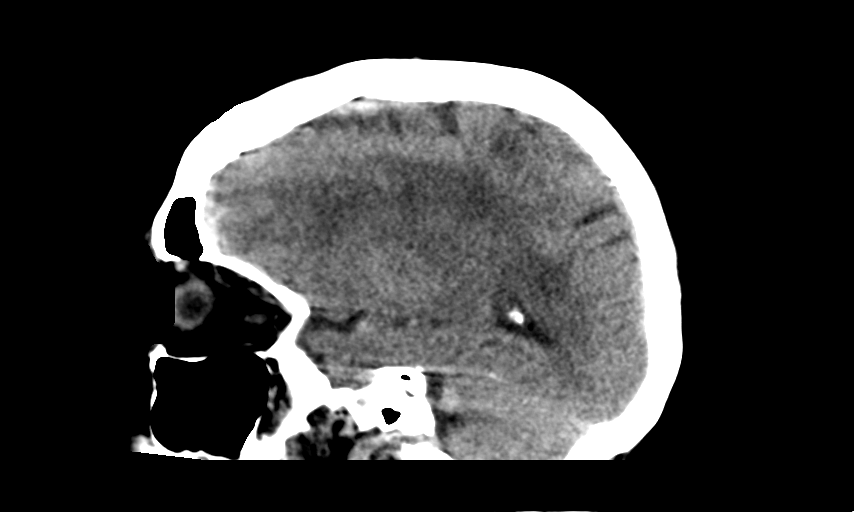
[im 46/67  brain]
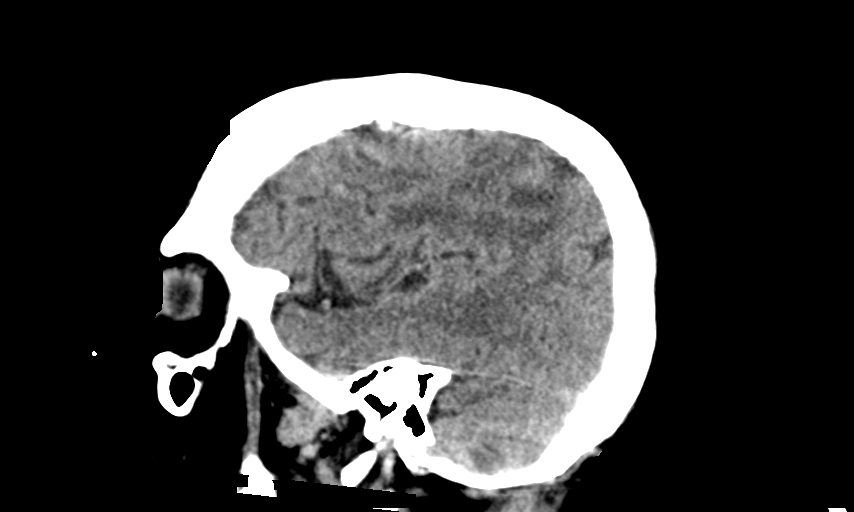

[Series 9: head without cor · coronal · non-contrast · 0.33mm/px · 3 of 79 slices shown]
[im 20/79  brain]
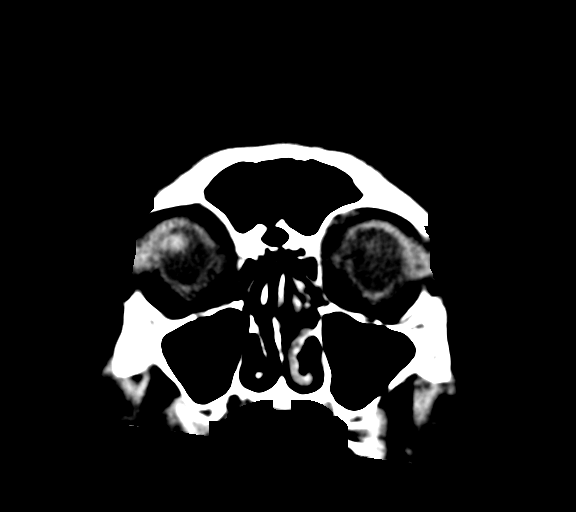
[im 40/79  brain]
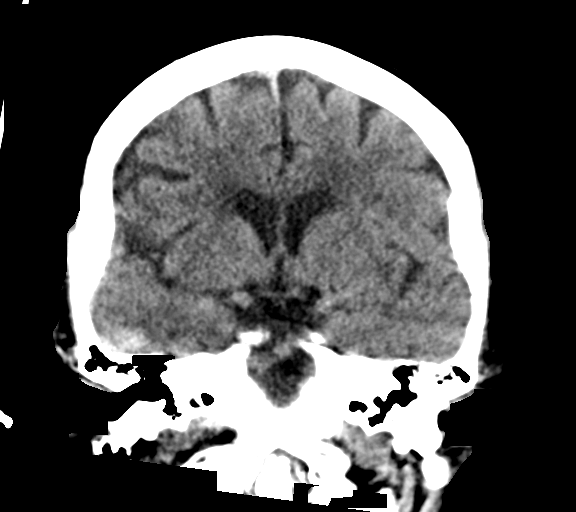
[im 59/79  brain]
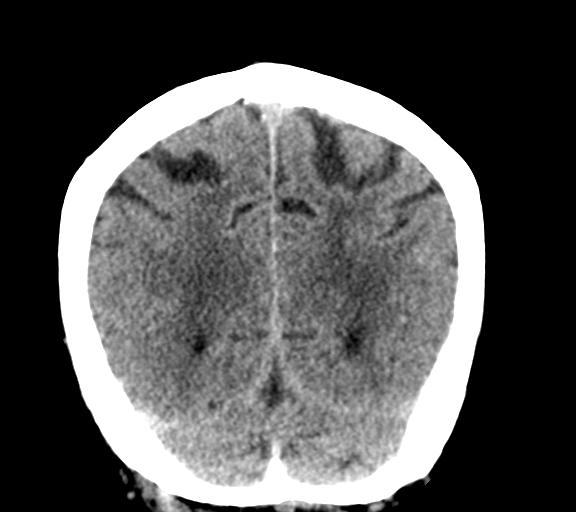

[15 of 47 positions shown; findings below may reference images not displayed]

FINDINGS: Brain: Hemorrhagic contusions involving the inferolateral right
temporal lobe and anterior inferior right frontal lobe demonstrate
expected evolution and are more evident on today's exam. Hyperdense
components are more prominent with some surrounding edema.
Subarachnoid hemorrhage is evident at both locations. Subdural blood
adjacent to the temporal lobe is stable. Anterior subdural
hemorrhage is more visible on today's study extending to anterior
falx.

No new areas of hemorrhage are present. Moderate atrophy and white
matter disease are again seen. The ventricles are of normal size.

Vascular: Atherosclerotic calcifications are present within the
cavernous internal carotid arteries bilaterally of the right
vertebral artery. No hyperdense vessel is present.

Skull: Calvarium is intact. Nondisplaced left occipital skull
fracture is again seen. Fracture extends to the foramen magnum. No
additional fractures are present. Skin staples are present over the
left occipital scalp laceration.

Sinuses/Orbits: The paranasal sinuses and mastoid air cells are
clear. The globes and orbits are within normal limits.
IMPRESSION: 1. Expected evolution of hemorrhagic contusions involving the
inferolateral right temporal lobe and anterior inferior right
frontal lobe.
2. Subarachnoid hemorrhage at both locations.
3. Stable subdural blood adjacent to the right temporal lobe.
4. Anterior subdural hemorrhage is more visible on today's study
extending to the anterior falx.
5. Stable nondisplaced left occipital skull fracture.
6. Stable atrophy and white matter disease.

## 2022-05-22 DIAGNOSIS — I5032 Chronic diastolic (congestive) heart failure: Secondary | ICD-10-CM | POA: Diagnosis not present

## 2022-05-22 DIAGNOSIS — I110 Hypertensive heart disease with heart failure: Secondary | ICD-10-CM | POA: Diagnosis not present

## 2022-05-22 DIAGNOSIS — E876 Hypokalemia: Secondary | ICD-10-CM | POA: Diagnosis not present

## 2022-05-22 DIAGNOSIS — Z7901 Long term (current) use of anticoagulants: Secondary | ICD-10-CM | POA: Diagnosis not present

## 2022-05-22 DIAGNOSIS — M810 Age-related osteoporosis without current pathological fracture: Secondary | ICD-10-CM | POA: Diagnosis not present

## 2022-05-22 DIAGNOSIS — I480 Paroxysmal atrial fibrillation: Secondary | ICD-10-CM | POA: Diagnosis not present

## 2022-05-23 DIAGNOSIS — I5033 Acute on chronic diastolic (congestive) heart failure: Secondary | ICD-10-CM | POA: Diagnosis not present

## 2022-05-23 DIAGNOSIS — M810 Age-related osteoporosis without current pathological fracture: Secondary | ICD-10-CM | POA: Diagnosis not present

## 2022-05-23 DIAGNOSIS — J9809 Other diseases of bronchus, not elsewhere classified: Secondary | ICD-10-CM | POA: Diagnosis not present

## 2022-05-23 DIAGNOSIS — I480 Paroxysmal atrial fibrillation: Secondary | ICD-10-CM | POA: Diagnosis not present

## 2022-05-23 DIAGNOSIS — I110 Hypertensive heart disease with heart failure: Secondary | ICD-10-CM | POA: Diagnosis not present

## 2022-05-23 DIAGNOSIS — Z853 Personal history of malignant neoplasm of breast: Secondary | ICD-10-CM | POA: Diagnosis not present

## 2022-05-23 DIAGNOSIS — R918 Other nonspecific abnormal finding of lung field: Secondary | ICD-10-CM | POA: Diagnosis not present

## 2022-05-23 DIAGNOSIS — K219 Gastro-esophageal reflux disease without esophagitis: Secondary | ICD-10-CM | POA: Diagnosis not present

## 2022-05-23 DIAGNOSIS — E785 Hyperlipidemia, unspecified: Secondary | ICD-10-CM | POA: Diagnosis not present

## 2022-05-25 IMAGING — XA IR VERTEBROPLASTY CERVICOTHORACIC INJ
1 series · 14 of 24 positions shown · IV contrast (IODINE)
Comparison: none

INDICATION: Severe upper thoracic pain secondary to compression fracture at T4

EXAM:
VERTEBROPLASTY AT T4
MEDICATIONS:
Vancomycin 1 g IV prophylaxis was ordered pre-procedure and
administered intravenously within 1 hour of incision.
ANESTHESIA/SEDATION:
General anesthesia.
FLUOROSCOPY TIME:  Fluoroscopy Time: 12 minutes 0 seconds (1277 mGy)
COMPLICATIONS:
None immediate.
TECHNIQUE: Informed written consent was obtained from the patient after a
thorough discussion of the procedural risks, benefits and
alternatives. All questions were addressed. Maximal Sterile Barrier
Technique was utilized including caps, mask, sterile gowns, sterile
gloves, sterile drape, hand hygiene and skin antiseptic. A timeout
was performed prior to the initiation of the procedure.

[Series 300: dr. (person_name) · 14 of 26 slices shown]
[im 1/26]
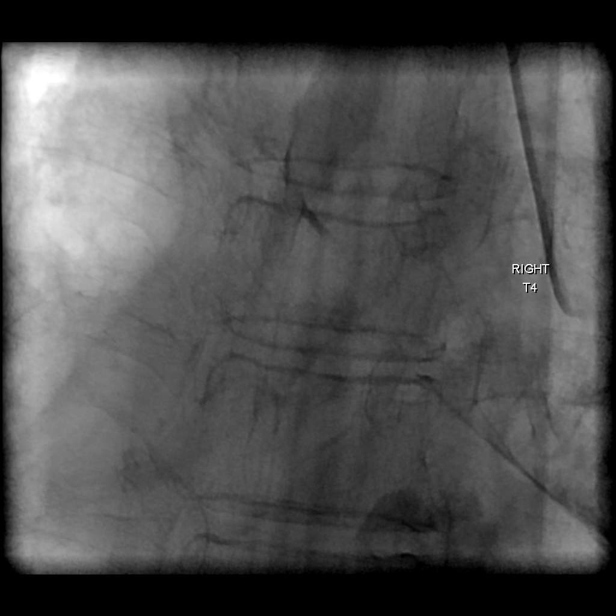
[im 3/26]
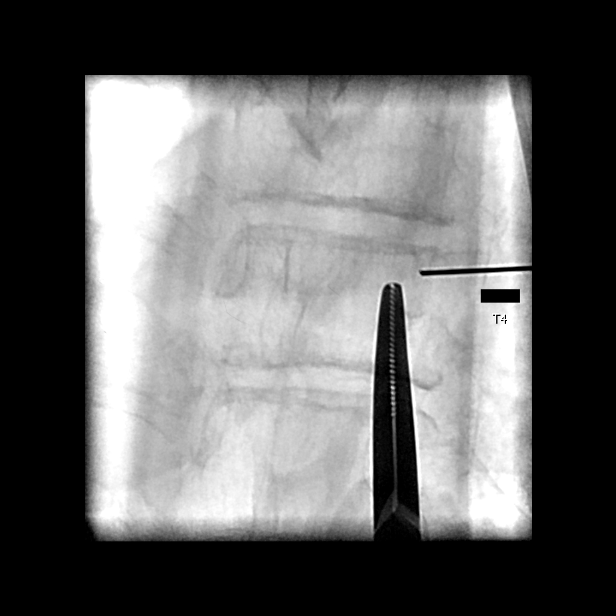
[im 5/26]
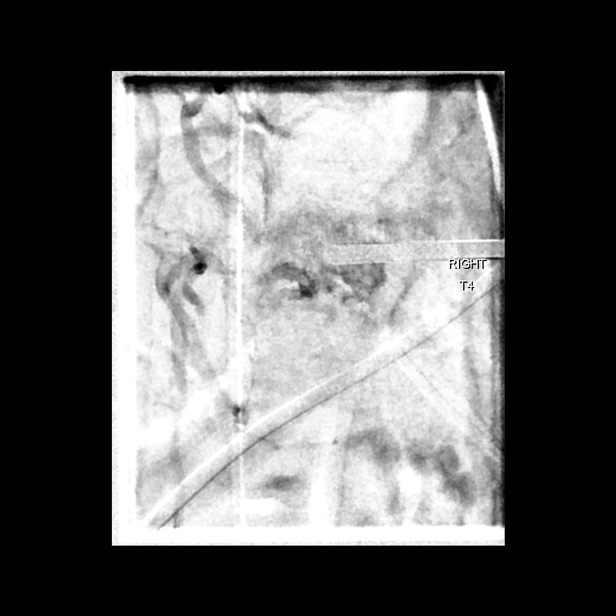
[im 7/26]
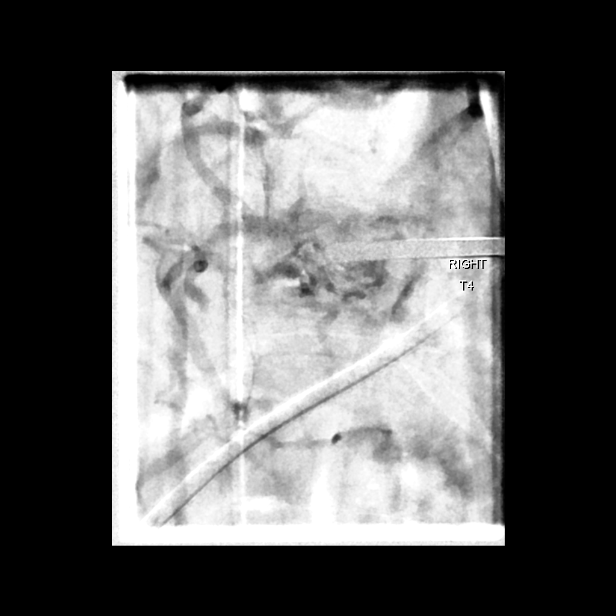
[im 8/26]
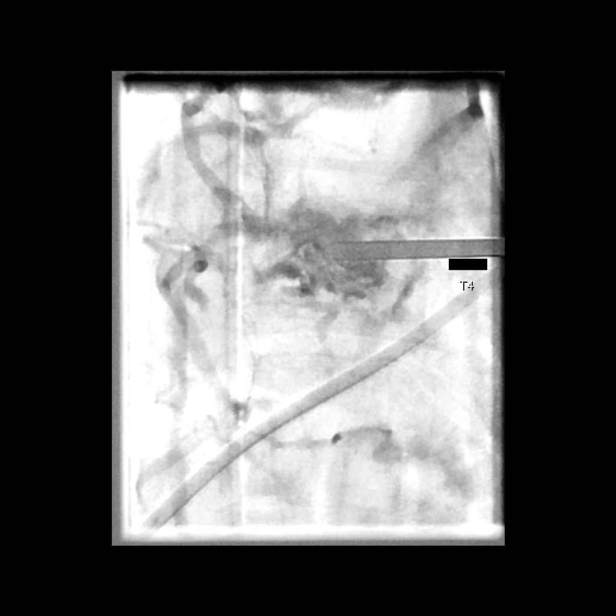
[im 10/26]
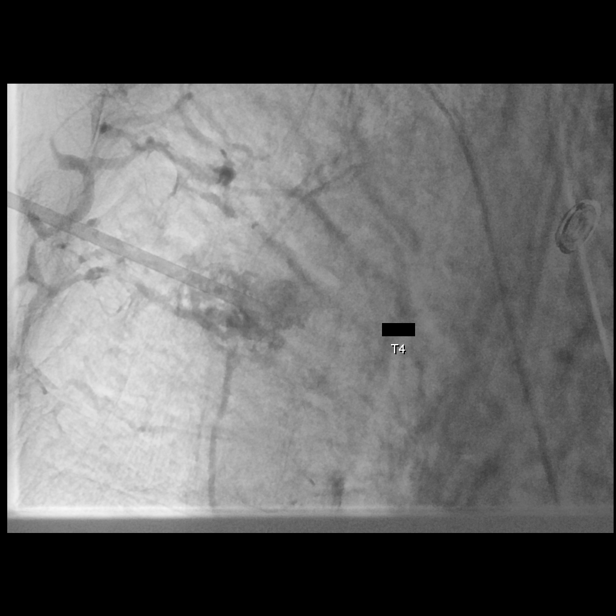
[im 12/26]
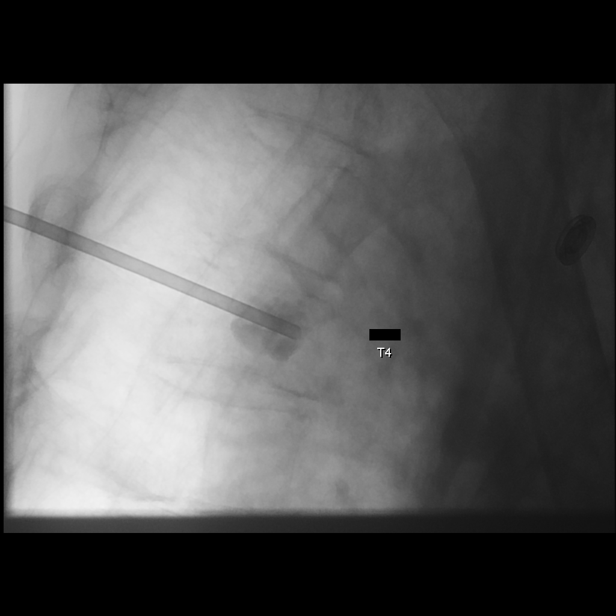
[im 14/26]
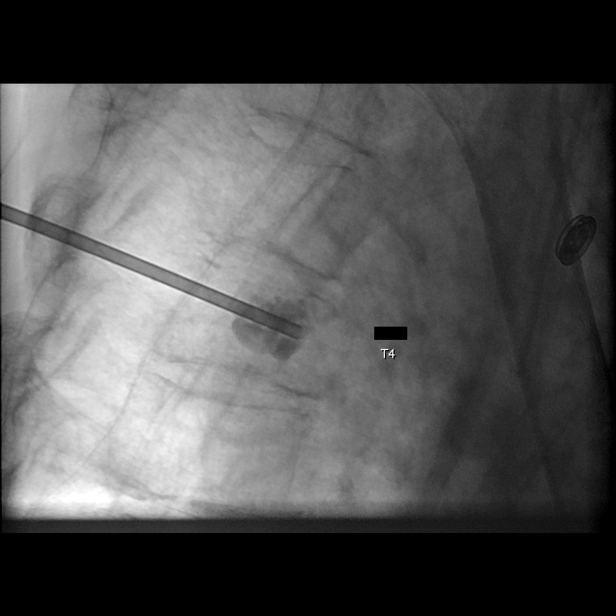
[im 16/26]
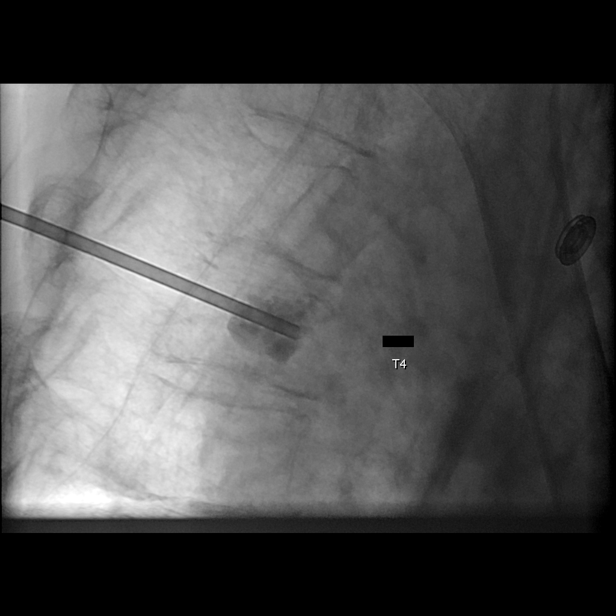
[im 18/26]
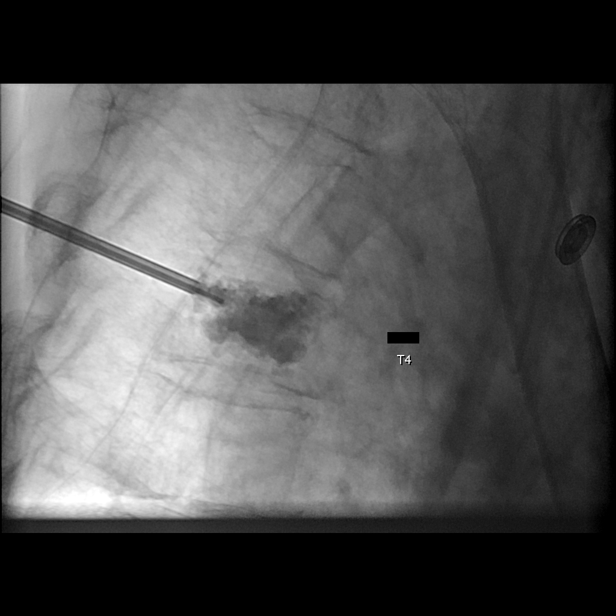
[im 20/26]
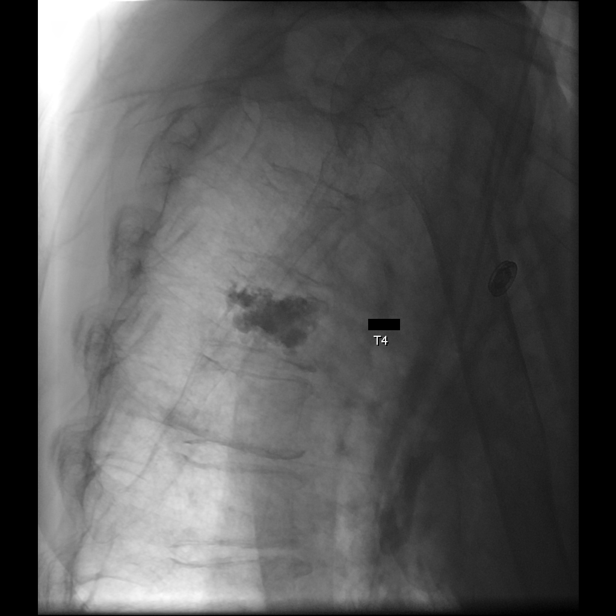
[im 21/26]
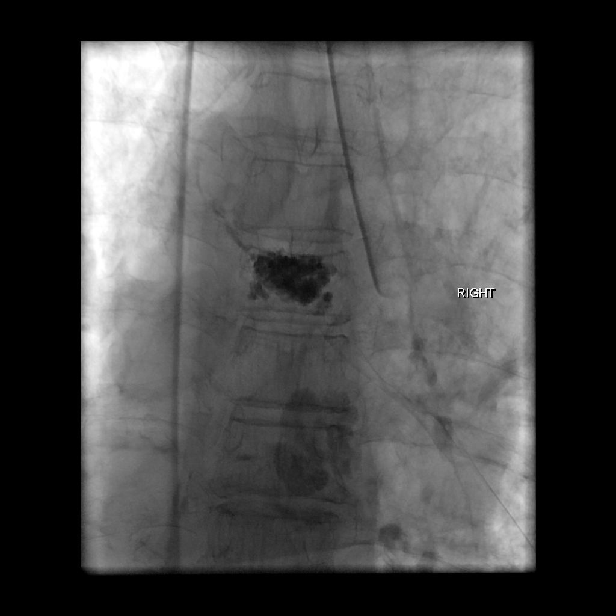
[im 23/26]
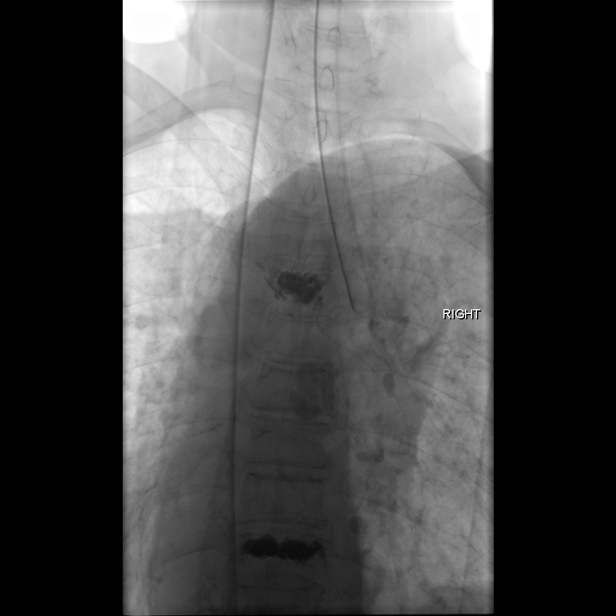
[im 26/26]
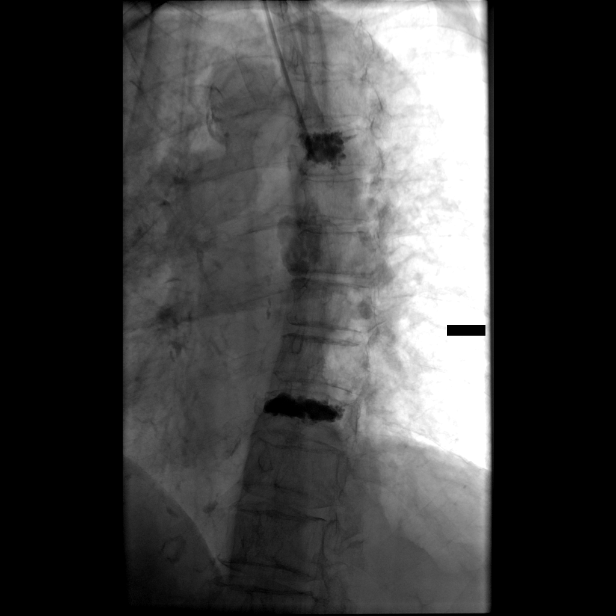

[14 of 24 positions shown; findings below may reference images not displayed]

PROCEDURE:
The patient was placed prone on the fluoroscopic table. Physiologic
monitoring was performed throughout the duration of the procedure by
anesthesiology service. The skin overlying the thoracic region was
prepped and draped in the usual sterile fashion. The T4 vertebral
body was identified and the right pedicle was infiltrated with 0.25%
Bupivacaine. This was then followed by the advancement of a 13-gauge
Cook needle through the right pedicle into the anterior one-third at
T4. A gentle contrast injection demonstrated a trabecular pattern of
contrast, with early opacification of epidural and paraspinous
veins. This necessitated the use of Gelfoam pledgets into the 13
gauge Al-Kadhi spinal needle prior to the delivery of the
methylmethacrylate mixture.

At this time, methylmethacrylate mixture was reconstituted. Under
biplane intermittent fluoroscopy, the methylmethacrylate was then
injected into the T4 vertebral body with filling of the vertebral
body.

No extravasation was noted into the disk spaces or posteriorly into
the spinal canal. No epidural venous contamination was seen.

The needle was then removed. Hemostasis was achieved at the skin
entry site.

The patient's general anesthesia was reversed. Upon recovery, the
patient responded appropriately to some command by moving all 4
extremities. The patient conversed appropriately. She was then taken
to PACU for recovery.

There were no acute complications. Patient tolerated the procedure
well.
IMPRESSION: Status post vertebral body augmentation for painful compression
fracture at T4 using vertebroplasty technique, under general
anesthesia.

## 2022-05-27 IMAGING — CR DG RIBS 2V*L*
3 series · 3 of 3 positions shown · non-contrast
Comparison: Chest radiograph November 05, 2019

CLINICAL DATA: Pain following recent fall

EXAM:
LEFT RIBS - 2 VIEW

[rib ap]
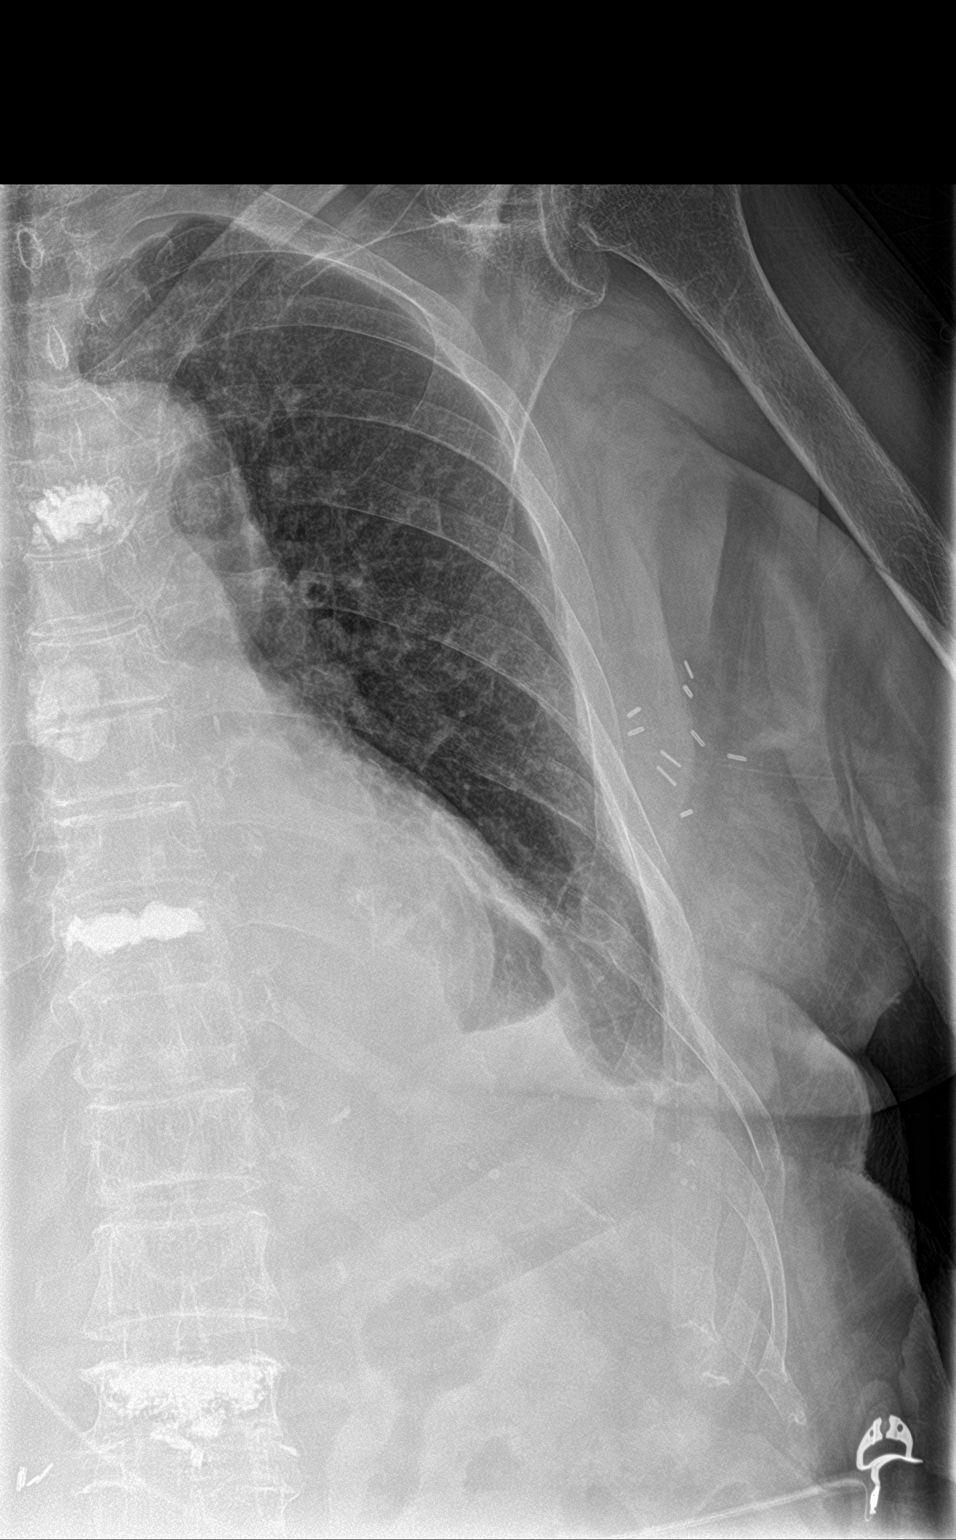

[rib ap obl (1 of 2)]
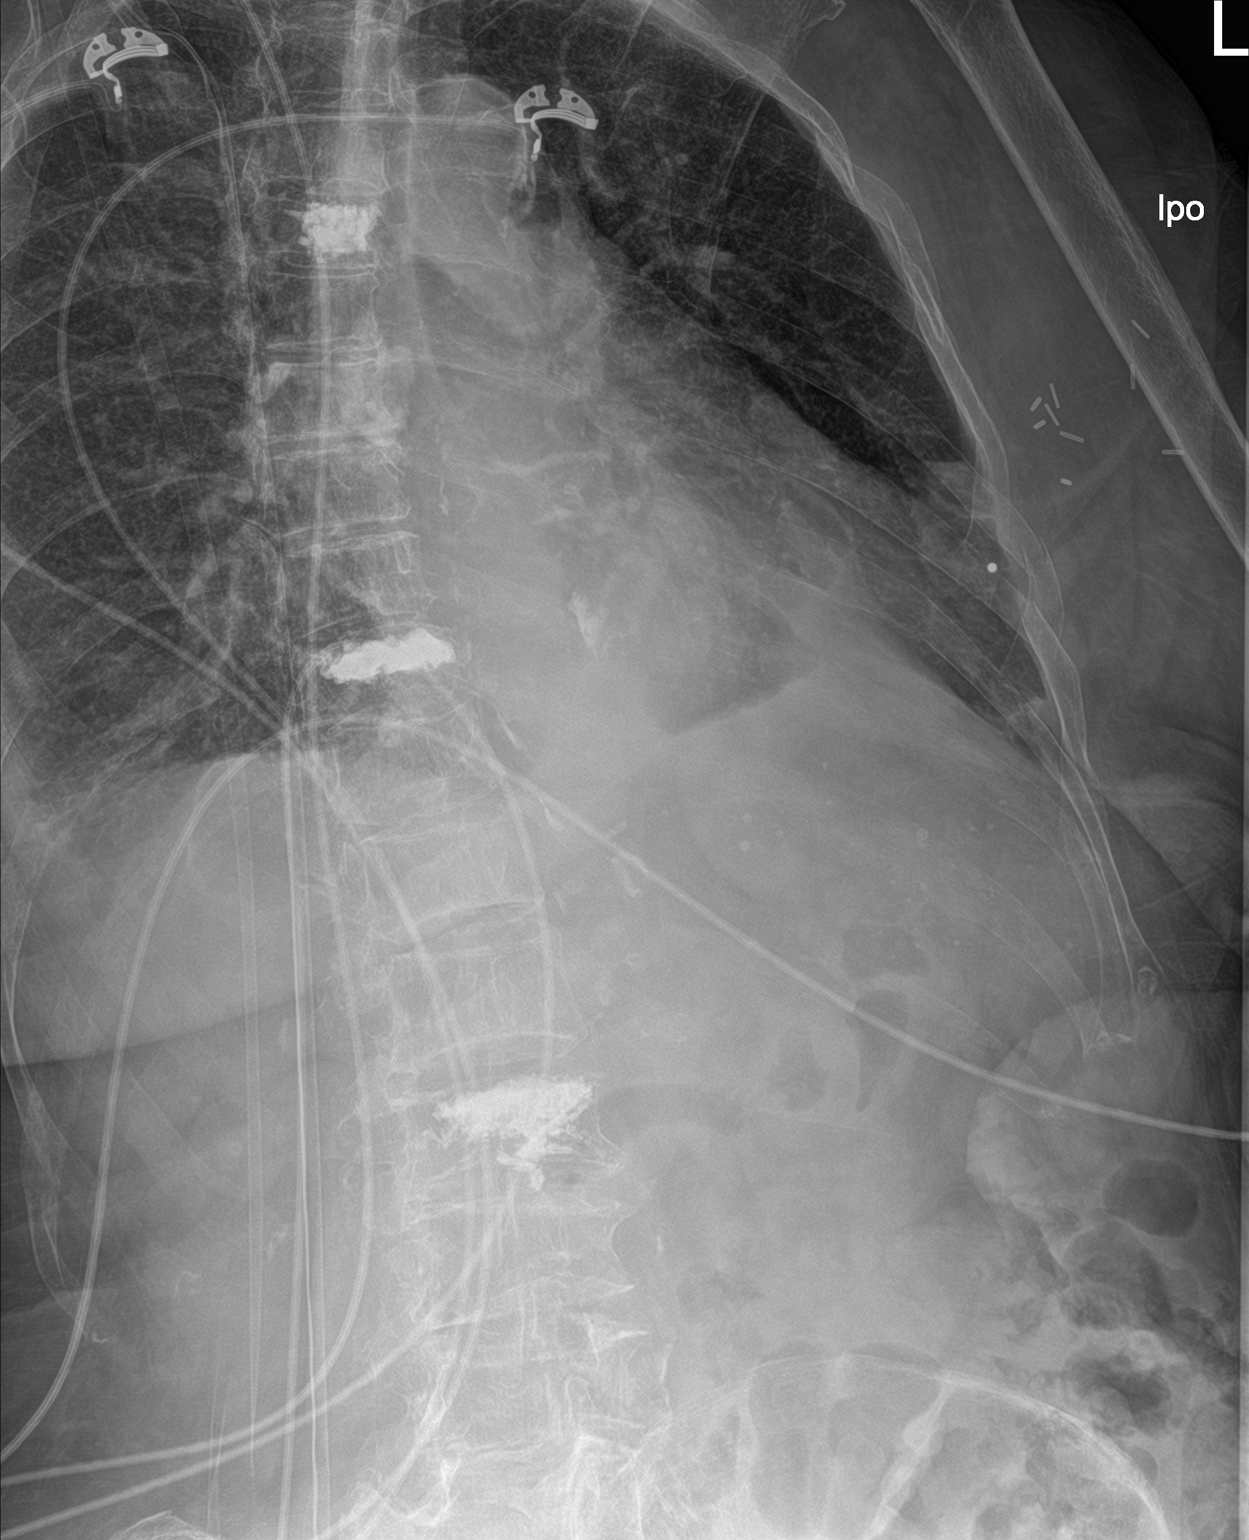

[rib ap obl (2 of 2)]
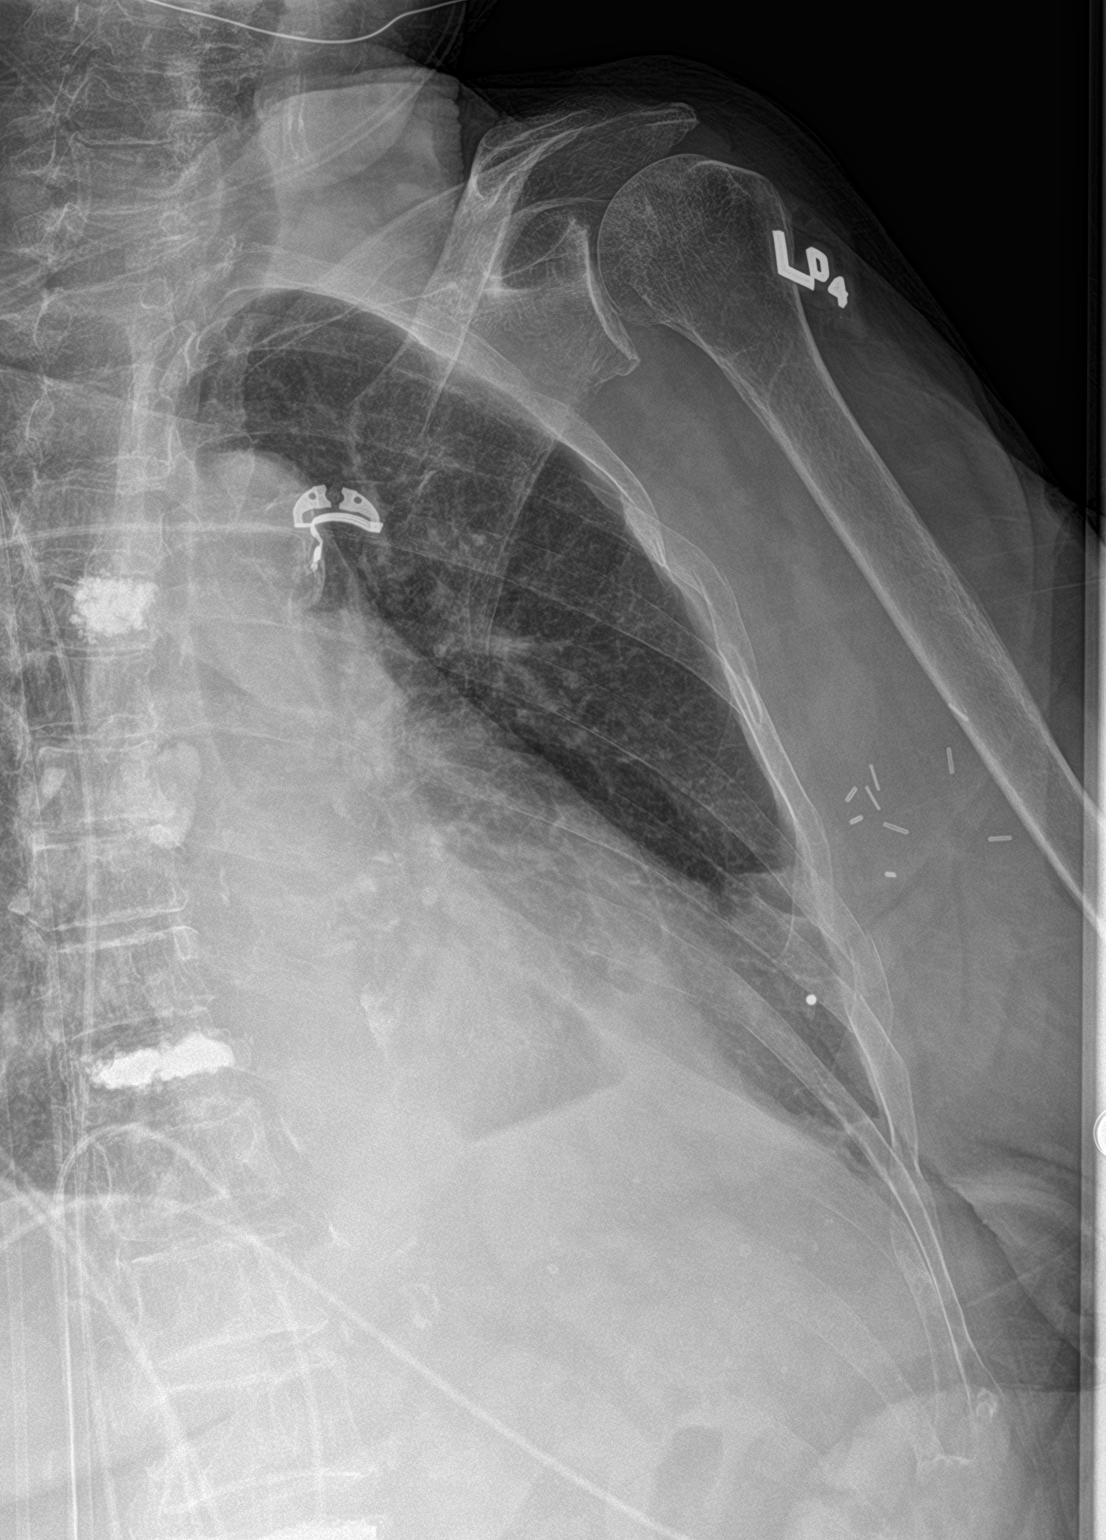

[3 of 3 positions shown; findings below may reference images not displayed]

FINDINGS: Oblique and cone-down rib images were obtained. There are fractures
of the left lateral fourth, fifth, sixth, seventh, eighth, and ninth
ribs. There is a small left pleural effusion with left base
atelectasis. No pneumothorax evident. There have been kyphoplasty
procedures at several levels in the thoracic and lumbar region.
There is underlying osteoporosis.
IMPRESSION: Multiple slightly displaced left-sided rib fractures without
pneumothorax. Small left pleural effusion with left base atelectasis
noted. Bones osteoporotic. Kyphoplasty procedures noted at multiple
thoracic and upper lumbar levels.

## 2022-06-04 IMAGING — CT CT HEAD W/O CM
3 series · 14 of 47 positions shown, 16 images · non-contrast
Comparison: 11/05/2019

CLINICAL DATA: Headaches, recent fall

EXAM:
CT HEAD WITHOUT CONTRAST
TECHNIQUE: Contiguous axial images were obtained from the base of the skull
through the vertex without intravenous contrast.

[Series 2: head wo · axial · 0.47mm/px · z∈[-133,-8]mm · 8 of 31 slices shown, 10 images]
[im 3/31  brain]
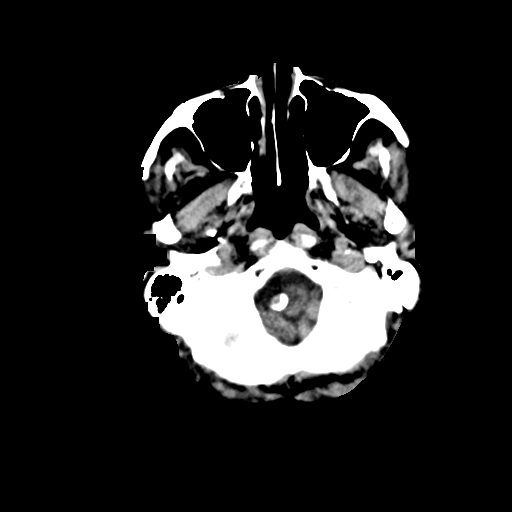
[im 3/31  bone]
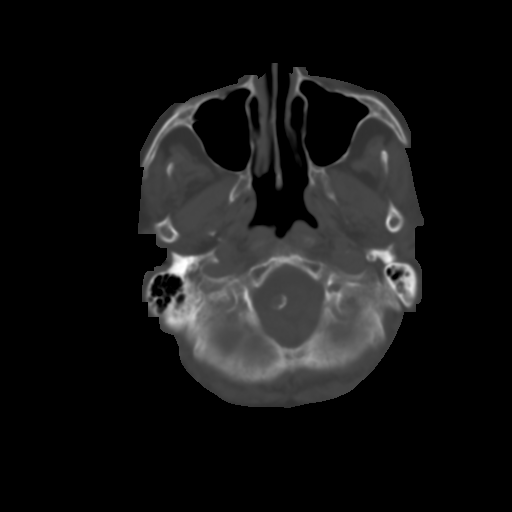
[im 7/31  brain]
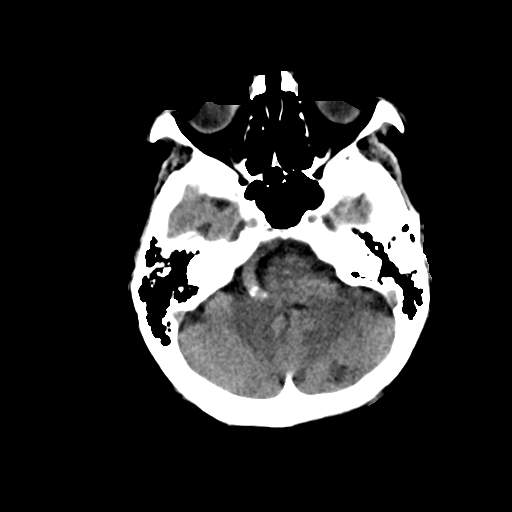
[im 10/31  brain]
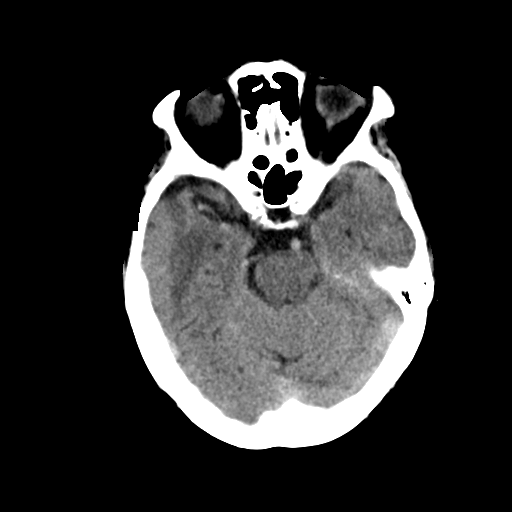
[im 14/31  brain]
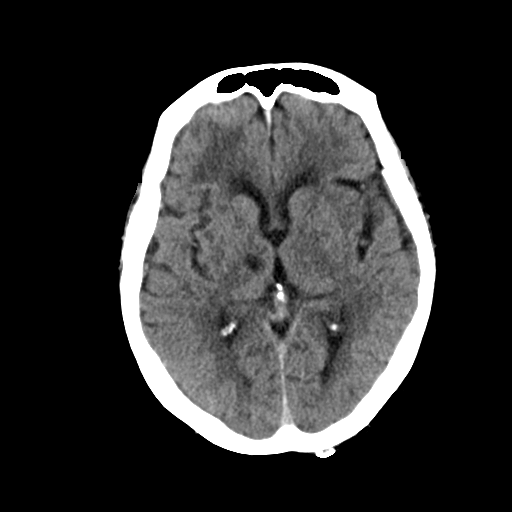
[im 17/31  brain]
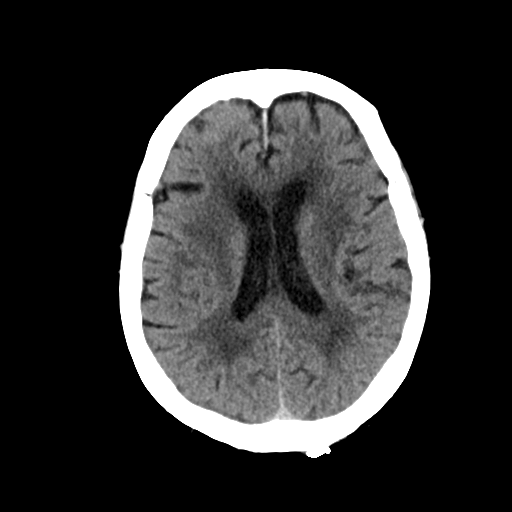
[im 17/31  bone]
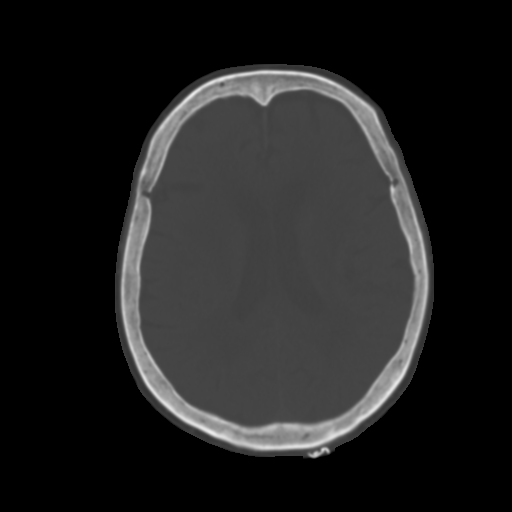
[im 21/31  brain]
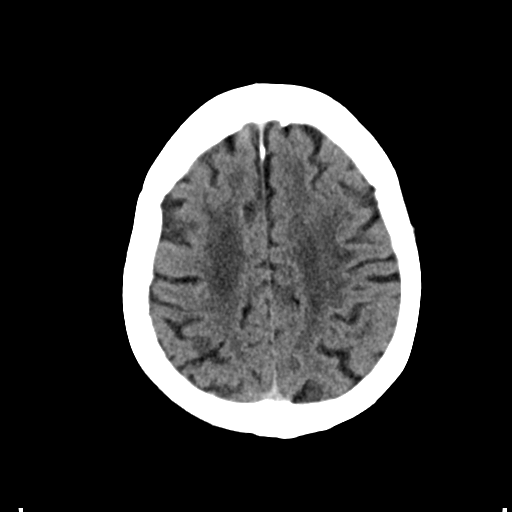
[im 24/31  brain]
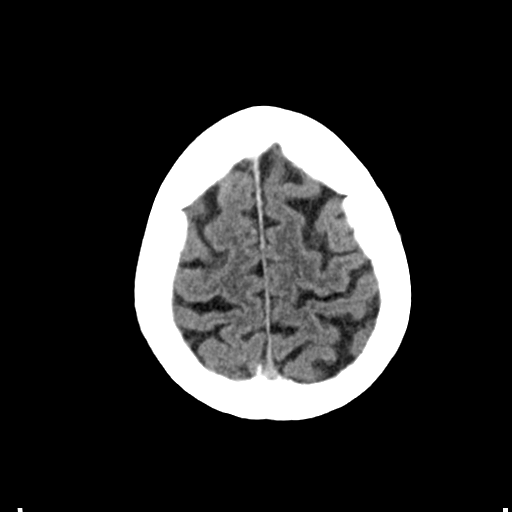
[im 28/31  brain]
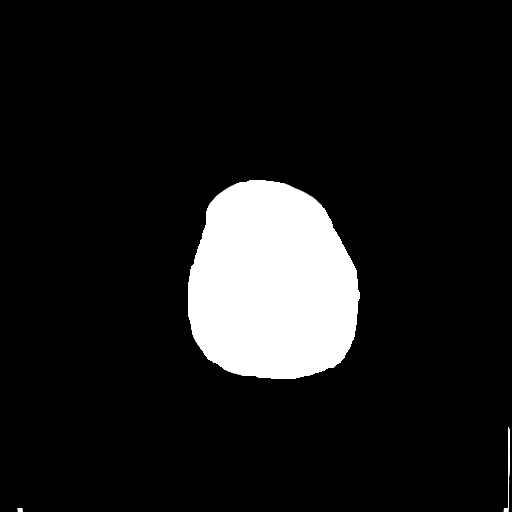

[Series 4: coronal soft tissue · coronal · 0.33mm/px · 3 of 72 slices shown]
[im 24/72  brain]
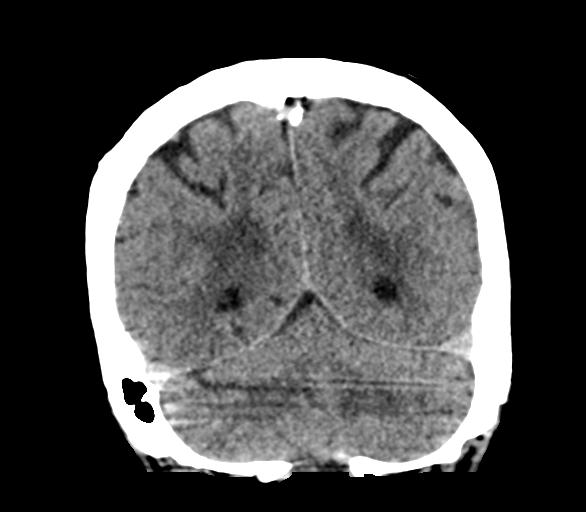
[im 32/72  brain]
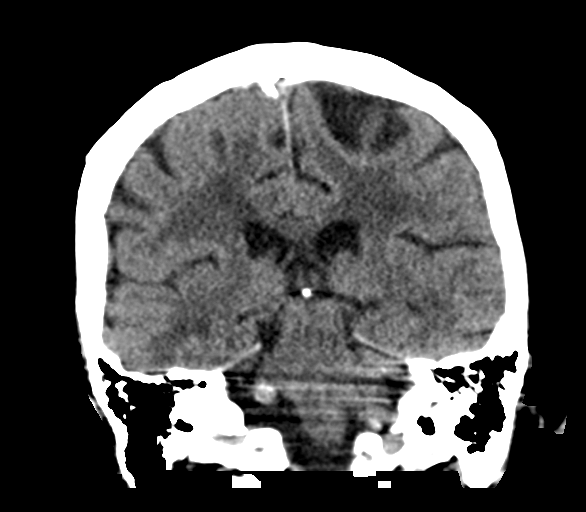
[im 40/72  brain]
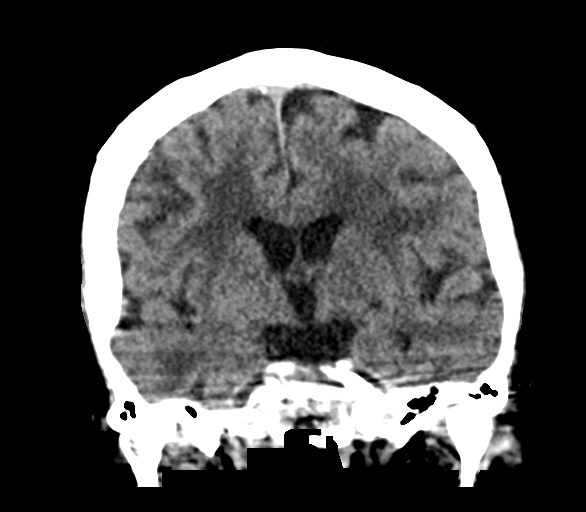

[Series 5: sagittal soft tissue · sagittal · 0.31mm/px · 3 of 67 slices shown]
[im 23/67  brain]
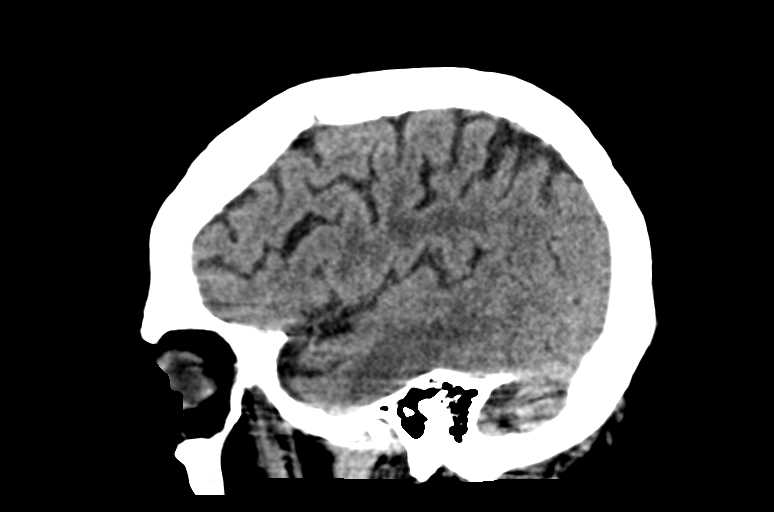
[im 34/67  brain]
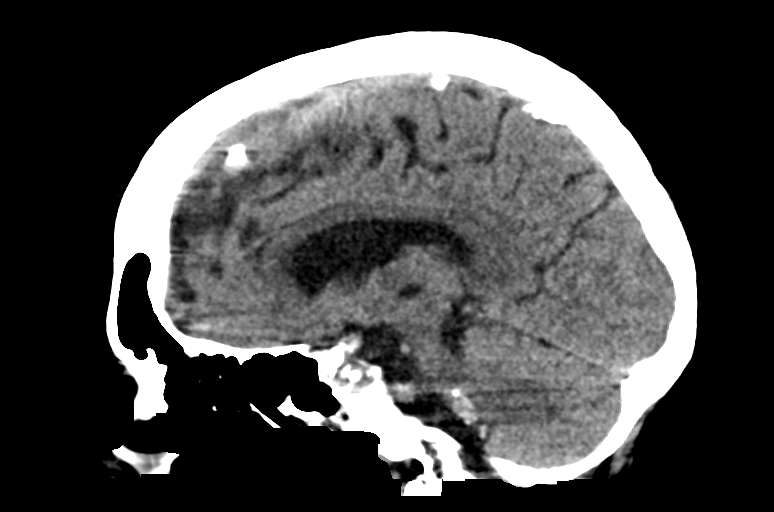
[im 45/67  brain]
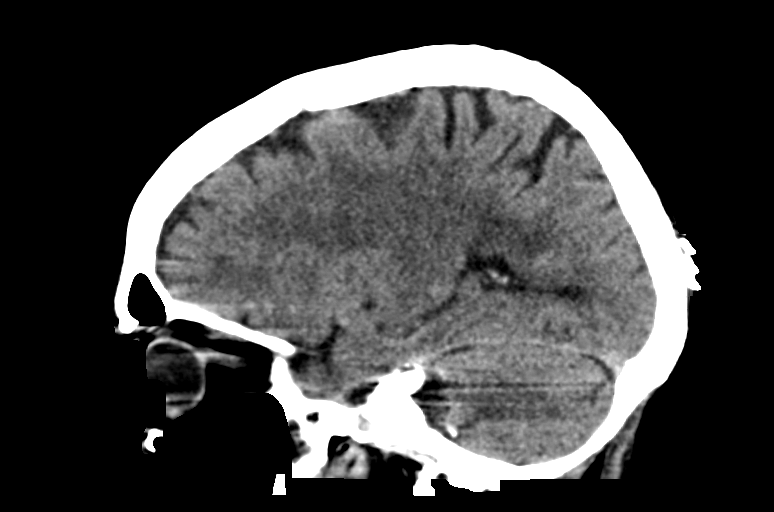

[14 of 47 positions shown; findings below may reference images not displayed]

FINDINGS: Brain: Expected evolution of right temporal and frontal parenchymal
contusions with nearly resolved hyperdense hemorrhage and decreased
edema. Hyperdense subdural and subarachnoid hemorrhage no longer
identified. No new hemorrhage or mass effect.

Ventricles are stable in size. Confluent areas of hypoattenuation in
the supratentorial white matter are nonspecific but probably reflect
advanced chronic microvascular ischemic changes. Chronic small
vessel infarct of the right thalamus. Increase in size of small left
cerebellar infarct.

Vascular: There is atherosclerotic calcification at the skull base.

Skull: Nondisplaced fracture of the left occipital calvarium.

Sinuses/Orbits: No acute finding.

Other: None.
IMPRESSION: Expected evolution of parenchymal contusions. Resolution of subdural
and subarachnoid hemorrhage. No new hemorrhage or mass effect.

Increase in size of small left cerebellar infarct since 11/05/2019.

Additional stable chronic findings detailed above.

## 2022-06-06 DIAGNOSIS — I5033 Acute on chronic diastolic (congestive) heart failure: Secondary | ICD-10-CM | POA: Diagnosis not present

## 2022-06-06 DIAGNOSIS — E785 Hyperlipidemia, unspecified: Secondary | ICD-10-CM | POA: Diagnosis not present

## 2022-06-06 DIAGNOSIS — Z853 Personal history of malignant neoplasm of breast: Secondary | ICD-10-CM | POA: Diagnosis not present

## 2022-06-06 DIAGNOSIS — K219 Gastro-esophageal reflux disease without esophagitis: Secondary | ICD-10-CM | POA: Diagnosis not present

## 2022-06-06 DIAGNOSIS — I480 Paroxysmal atrial fibrillation: Secondary | ICD-10-CM | POA: Diagnosis not present

## 2022-06-06 DIAGNOSIS — Z7901 Long term (current) use of anticoagulants: Secondary | ICD-10-CM | POA: Diagnosis not present

## 2022-06-06 DIAGNOSIS — I110 Hypertensive heart disease with heart failure: Secondary | ICD-10-CM | POA: Diagnosis not present

## 2022-06-12 DIAGNOSIS — I213 ST elevation (STEMI) myocardial infarction of unspecified site: Secondary | ICD-10-CM | POA: Diagnosis not present

## 2022-06-20 ENCOUNTER — Other Ambulatory Visit: Payer: Self-pay

## 2022-06-20 ENCOUNTER — Inpatient Hospital Stay: Payer: Medicare Other

## 2022-06-20 ENCOUNTER — Inpatient Hospital Stay: Payer: Medicare Other | Attending: Hematology & Oncology

## 2022-06-20 ENCOUNTER — Inpatient Hospital Stay (HOSPITAL_BASED_OUTPATIENT_CLINIC_OR_DEPARTMENT_OTHER): Payer: Medicare Other | Admitting: Hematology & Oncology

## 2022-06-20 ENCOUNTER — Encounter: Payer: Self-pay | Admitting: Hematology & Oncology

## 2022-06-20 VITALS — BP 144/60 | HR 60 | Temp 97.6°F | Resp 20 | Ht 66.0 in | Wt 120.0 lb

## 2022-06-20 DIAGNOSIS — D509 Iron deficiency anemia, unspecified: Secondary | ICD-10-CM | POA: Diagnosis not present

## 2022-06-20 DIAGNOSIS — D500 Iron deficiency anemia secondary to blood loss (chronic): Secondary | ICD-10-CM

## 2022-06-20 DIAGNOSIS — Z7901 Long term (current) use of anticoagulants: Secondary | ICD-10-CM | POA: Insufficient documentation

## 2022-06-20 DIAGNOSIS — I4891 Unspecified atrial fibrillation: Secondary | ICD-10-CM | POA: Insufficient documentation

## 2022-06-20 DIAGNOSIS — D45 Polycythemia vera: Secondary | ICD-10-CM | POA: Insufficient documentation

## 2022-06-20 DIAGNOSIS — Z86711 Personal history of pulmonary embolism: Secondary | ICD-10-CM | POA: Diagnosis not present

## 2022-06-20 DIAGNOSIS — D751 Secondary polycythemia: Secondary | ICD-10-CM | POA: Diagnosis not present

## 2022-06-20 DIAGNOSIS — I2699 Other pulmonary embolism without acute cor pulmonale: Secondary | ICD-10-CM

## 2022-06-20 DIAGNOSIS — Z853 Personal history of malignant neoplasm of breast: Secondary | ICD-10-CM | POA: Insufficient documentation

## 2022-06-20 DIAGNOSIS — S81812A Laceration without foreign body, left lower leg, initial encounter: Secondary | ICD-10-CM | POA: Diagnosis not present

## 2022-06-20 DIAGNOSIS — I10 Essential (primary) hypertension: Secondary | ICD-10-CM | POA: Diagnosis not present

## 2022-06-20 DIAGNOSIS — I480 Paroxysmal atrial fibrillation: Secondary | ICD-10-CM | POA: Diagnosis not present

## 2022-06-20 LAB — CBC WITH DIFFERENTIAL (CANCER CENTER ONLY)
Abs Immature Granulocytes: 0.1 10*3/uL — ABNORMAL HIGH (ref 0.00–0.07)
Basophils Absolute: 0.1 10*3/uL (ref 0.0–0.1)
Basophils Relative: 1 %
Eosinophils Absolute: 0.2 10*3/uL (ref 0.0–0.5)
Eosinophils Relative: 2 %
HCT: 49.6 % — ABNORMAL HIGH (ref 36.0–46.0)
Hemoglobin: 15.1 g/dL — ABNORMAL HIGH (ref 12.0–15.0)
Immature Granulocytes: 1 %
Lymphocytes Relative: 8 %
Lymphs Abs: 0.6 10*3/uL — ABNORMAL LOW (ref 0.7–4.0)
MCH: 27 pg (ref 26.0–34.0)
MCHC: 30.4 g/dL (ref 30.0–36.0)
MCV: 88.6 fL (ref 80.0–100.0)
Monocytes Absolute: 0.8 10*3/uL (ref 0.1–1.0)
Monocytes Relative: 10 %
Neutro Abs: 6 10*3/uL (ref 1.7–7.7)
Neutrophils Relative %: 78 %
Platelet Count: 481 10*3/uL — ABNORMAL HIGH (ref 150–400)
RBC: 5.6 MIL/uL — ABNORMAL HIGH (ref 3.87–5.11)
RDW: 15.9 % — ABNORMAL HIGH (ref 11.5–15.5)
WBC Count: 7.7 10*3/uL (ref 4.0–10.5)
nRBC: 0 % (ref 0.0–0.2)

## 2022-06-20 LAB — CMP (CANCER CENTER ONLY)
ALT: 14 U/L (ref 0–44)
AST: 24 U/L (ref 15–41)
Albumin: 4.3 g/dL (ref 3.5–5.0)
Alkaline Phosphatase: 49 U/L (ref 38–126)
Anion gap: 7 (ref 5–15)
BUN: 9 mg/dL (ref 8–23)
CO2: 36 mmol/L — ABNORMAL HIGH (ref 22–32)
Calcium: 9.7 mg/dL (ref 8.9–10.3)
Chloride: 97 mmol/L — ABNORMAL LOW (ref 98–111)
Creatinine: 0.97 mg/dL (ref 0.44–1.00)
GFR, Estimated: 56 mL/min — ABNORMAL LOW (ref >60)
Glucose, Bld: 95 mg/dL (ref 70–99)
Potassium: 3.2 mmol/L — ABNORMAL LOW (ref 3.5–5.1)
Sodium: 140 mmol/L (ref 135–145)
Total Bilirubin: 1.4 mg/dL — ABNORMAL HIGH (ref 0.3–1.2)
Total Protein: 7.4 g/dL (ref 6.5–8.1)

## 2022-06-20 LAB — RETICULOCYTES
Immature Retic Fract: 9.3 % (ref 2.3–15.9)
RBC.: 5.52 MIL/uL — ABNORMAL HIGH (ref 3.87–5.11)
Retic Count, Absolute: 71.2 10*3/uL (ref 19.0–186.0)
Retic Ct Pct: 1.3 % (ref 0.4–3.1)

## 2022-06-20 LAB — LACTATE DEHYDROGENASE: LDH: 181 U/L (ref 98–192)

## 2022-06-20 LAB — SAVE SMEAR(SSMR), FOR PROVIDER SLIDE REVIEW

## 2022-06-20 LAB — FERRITIN: Ferritin: 14 ng/mL (ref 11–307)

## 2022-06-20 NOTE — Progress Notes (Signed)
[name] presents today for phlebotomy per MD orders. Phlebotomy procedure started at 1313 and ended at 1330. 527 grams removed from lt AC using 20g IV catheter by THill, LPN Patient observed for 30 minutes after procedure without any incident. Patient tolerated procedure well. IV needle removed intact.

## 2022-06-20 NOTE — Progress Notes (Signed)
Hematology and Oncology Follow Up Visit  [name] [id number] [birth year] 89 y.o. 06/20/2022   Principle Diagnosis:  Polycythemia vera - JAK2 positive Stage I (T1bN0M0 ) infiltrating duct carcinoma the left breast  Iron deficiency anemia -- symptomatic Pulmonary embolism-11/30/2020   Past Therapy: Femara 2.5 mg by mouth daily - stopped 08/2015   Current Therapy:        Phlebotomy to maintain hematocrit below 45% Hydrea 500 mg by mouth daily -- re-started on 07/16/2019 --DC on 02/15/2021 IV Iron as needed Eliquis 2.5 mg po BID -- Atrial fibrillation Plavix 75 mg po q day    Interim History: [name] is here today for follow-up.  We have not seen her back for quite a while.  Last time that we saw her back was back in April 2023.  Since then, she has had a lot happen to her.  She apparently had a heart attack back in October.  She had a stent placed.  I think there is an a right coronary artery.  She is on Eliquis.  She is also on Plavix.  She has had problems falling.  She is in [facility] facility.  She has not some leg swelling.  I suspect this might be from her Norvasc.  We have not phlebotomized her for a while.  Her hematocrit today is almost 50%.  Her platelet count is up a little bit.  I think she has been on Hydrea in the past.  She has had no fever.  I do not think there has been a problem with COVID.  She has had no nausea or vomiting.  She has had no change in bowel or bladder habits.  The know that she has broken some bones when she has fallen.  She is dealing with a left hip that is causing her issues.  Currently, I would have to say that her performance status is probably ECOG 3.    Medications:  Allergies as of 06/20/2022       Reactions   Penicillins Hives, Rash, Other (See Comments)   Has patient had a PCN reaction causing immediate rash, facial/tongue/throat swelling, SOB or lightheadedness with hypotension: Yes Has patient had a PCN reaction  causing severe rash involving mucus membranes or skin necrosis: yes Has patient had a PCN reaction that required hospitalization: No Has patient had a PCN reaction occurring within the last 10 years: no If all of the above answers are "NO", then may proceed with Cephalosporin use. Other Reaction: OTHER REACTION   Valsartan Itching, Other (See Comments)   Atorvastatin Diarrhea   Carvedilol Other (See Comments)   "teeth hurt when I took it"   Ezetimibe Nausea Only   Fluvastatin Sodium Other (See Comments)   "Can't remember"   Magnesium Hydroxide Other (See Comments)   REACTION: triggers HAs   Meloxicam Other (See Comments)   Pt seeing auro's / spots.     Pneumovax [pneumococcal Polysaccharide Vaccine] Other (See Comments)   Local reaction   Quinapril Hcl Other (See Comments)    "feelings of tiredness"   Simvastatin Diarrhea   Topamax [topiramate] Itching   Vit D-vit E-safflower Oil Other (See Comments)   Headaches        Medication List        Accurate as of June 20, 2022  1:06 PM. If you have any questions, ask your nurse or doctor.          acetaminophen 325 MG tablet Commonly known as: TYLENOL Take 650  mg by mouth every 6 (six) hours as needed for mild pain.   alendronate 70 MG tablet Commonly known as: FOSAMAX Take 1 tablet (70 mg total) by mouth once a week. Take with a full glass of water on an empty stomach.   amiodarone 200 MG tablet Commonly known as: PACERONE Take 200 mg by mouth daily. What changed: Another medication with the same name was removed. Continue taking this medication, and follow the directions you see here. Changed by: Volanda Napoleon, MD   amLODipine 5 MG tablet Commonly known as: NORVASC Take 1 tablet (5 mg total) by mouth daily.   apixaban 2.5 MG Tabs tablet Commonly known as: ELIQUIS Take 1 tablet (2.5 mg total) by mouth 2 (two) times daily.   clopidogrel 75 MG tablet Commonly known as: PLAVIX Take 1 tablet (75 mg total) by  mouth daily.   furosemide 20 MG tablet Commonly known as: LASIX Take 1 tablet (20 mg total) by mouth daily as needed for fluid or edema (for weigh gain of 3 pounds or more over one day).   LORazepam 0.5 MG tablet Commonly known as: ATIVAN Take 1 tablet (0.5 mg total) by mouth 2 (two) times daily as needed for anxiety.   metoprolol tartrate 50 MG tablet Commonly known as: LOPRESSOR Take 1 tablet (50 mg total) by mouth 2 (two) times daily.   nitroGLYCERIN 0.4 MG SL tablet Commonly known as: NITROSTAT Place 1 tablet (0.4 mg total) under the tongue every 5 (five) minutes x 3 doses as needed for chest pain.   ondansetron 4 MG tablet Commonly known as: ZOFRAN Take 1 tablet (4 mg total) by mouth every 8 (eight) hours as needed for nausea or vomiting.   pantoprazole 40 MG tablet Commonly known as: PROTONIX Take 1 tablet (40 mg total) by mouth at bedtime.   rosuvastatin 20 MG tablet Commonly known as: CRESTOR Take 1 tablet (20 mg total) by mouth daily.   sertraline 25 MG tablet Commonly known as: ZOLOFT Take 25 mg by mouth daily.        Allergies:  Allergies  Allergen Reactions   Penicillins Hives, Rash and Other (See Comments)    Has patient had a PCN reaction causing immediate rash, facial/tongue/throat swelling, SOB or lightheadedness with hypotension: Yes Has patient had a PCN reaction causing severe rash involving mucus membranes or skin necrosis: yes Has patient had a PCN reaction that required hospitalization: No Has patient had a PCN reaction occurring within the last 10 years: no If all of the above answers are "NO", then may proceed with Cephalosporin use.  Other Reaction: OTHER REACTION   Valsartan Itching and Other (See Comments)   Atorvastatin Diarrhea   Carvedilol Other (See Comments)    "teeth hurt when I took it"    Ezetimibe Nausea Only   Fluvastatin Sodium Other (See Comments)    "Can't remember"   Magnesium Hydroxide Other (See Comments)     REACTION: triggers HAs   Meloxicam Other (See Comments)    Pt seeing auro's / spots.     Pneumovax [Pneumococcal Polysaccharide Vaccine] Other (See Comments)    Local reaction   Quinapril Hcl Other (See Comments)     "feelings of tiredness"   Simvastatin Diarrhea   Topamax [Topiramate] Itching   Vit D-Vit E-Safflower Oil Other (See Comments)    Headaches    Past Medical History, Surgical history, Social history, and Family History were reviewed and updated.  Review of Systems: Review of Systems  Constitutional:  Positive for malaise/fatigue and weight loss.  HENT: Negative.    Eyes: Negative.   Respiratory: Negative.    Cardiovascular: Negative.   Gastrointestinal: Negative.   Genitourinary: Negative.   Musculoskeletal:  Positive for back pain and joint pain.  Skin: Negative.   Neurological: Negative.   Endo/Heme/Allergies: Negative.   Psychiatric/Behavioral: Negative.       Physical Exam:  height is '5\' 6"'$  (1.676 m) and weight is 120 lb (54.4 kg). Her oral temperature is 97.6 F (36.4 C). Her blood pressure is 144/60 (abnormal) and her pulse is 60. Her respiration is 20 and oxygen saturation is 100%.   Wt Readings from Last 3 Encounters:  06/20/22 120 lb (54.4 kg)  02/26/22 126 lb 8.7 oz (57.4 kg)  12/19/21 113 lb 8 oz (51.5 kg)    Physical Exam Vitals reviewed.  HENT:     Head: Normocephalic and atraumatic.  Eyes:     Pupils: Pupils are equal, round, and reactive to light.  Cardiovascular:     Rate and Rhythm: Normal rate and regular rhythm.     Heart sounds: Normal heart sounds.  Pulmonary:     Effort: Pulmonary effort is normal.     Breath sounds: Normal breath sounds.  Abdominal:     General: Bowel sounds are normal.     Palpations: Abdomen is soft.  Musculoskeletal:        General: No tenderness or deformity. Normal range of motion.     Cervical back: Normal range of motion.  Lymphadenopathy:     Cervical: No cervical adenopathy.  Skin:    General:  Skin is warm and dry.     Findings: No erythema or rash.  Neurological:     Mental Status: She is alert and oriented to person, place, and time.  Psychiatric:        Behavior: Behavior normal.        Thought Content: Thought content normal.        Judgment: Judgment normal.     Lab Results  Component Value Date   WBC 7.7 06/20/2022   HGB 15.1 (H) 06/20/2022   HCT 49.6 (H) 06/20/2022   MCV 88.6 06/20/2022   PLT 481 (H) 06/20/2022   Lab Results  Component Value Date   FERRITIN 67 09/05/2021   IRON 110 09/05/2021   TIBC 356 09/05/2021   UIBC 246 09/05/2021   IRONPCTSAT 31 09/05/2021   Lab Results  Component Value Date   RETICCTPCT 1.3 06/20/2022   RBC 5.52 (H) 06/20/2022   RETICCTABS 41.4 02/02/2015   No results found for: "KPAFRELGTCHN", "LAMBDASER", "KAPLAMBRATIO" No results found for: "IGGSERUM", "IGA", "IGMSERUM" No results found for: "TOTALPROTELP", "ALBUMINELP", "A1GS", "A2GS", "BETS", "BETA2SER", "GAMS", "MSPIKE", "SPEI"   Chemistry      Component Value Date/Time   NA 140 06/20/2022 1154   NA 139 03/22/2021 1453   NA 142 03/25/2017 1457   NA 139 04/10/2016 0855   K 3.2 (L) 06/20/2022 1154   K 4.1 03/25/2017 1457   K 4.2 04/10/2016 0855   CL 97 (L) 06/20/2022 1154   CL 96 (L) 03/25/2017 1457   CO2 36 (H) 06/20/2022 1154   CO2 32 03/25/2017 1457   CO2 29 04/10/2016 0855   BUN 9 06/20/2022 1154   BUN 9 03/22/2021 1453   BUN 7 03/25/2017 1457   BUN 10.0 04/10/2016 0855   CREATININE 0.97 06/20/2022 1154   CREATININE 0.55 (L) 04/22/2020 1444   CREATININE 0.8 04/10/2016 0855  Component Value Date/Time   CALCIUM 9.7 06/20/2022 1154   CALCIUM 9.8 03/25/2017 1457   CALCIUM 9.5 04/10/2016 0855   ALKPHOS 49 06/20/2022 1154   ALKPHOS 86 (H) 03/25/2017 1457   ALKPHOS 85 04/10/2016 0855   AST 24 06/20/2022 1154   AST 19 04/10/2016 0855   ALT 14 06/20/2022 1154   ALT 15 03/25/2017 1457   ALT 13 04/10/2016 0855   BILITOT 1.4 (H) 06/20/2022 1154   BILITOT  1.26 (H) 04/10/2016 0855       Impression and Plan: [name] is a very pleasant 89 yo caucasian female with polycythemia and history of stage I infiltrating ductal carcinoma of the left breast with lumpectomy in 2013 as well as polycythemia, JAK2 positive.   Again, she has quite a few health issues.  Her biggest issue clearly is a coronary artery disease in the myocardial infarction.  I realize she does have the polycythemia.  We will go ahead and phlebotomize her.  This may help with the leg swelling a little bit.  Again, she has a doctor at [facility] that he can certainly help her out with with all of these health issues that she has.  She is on both the Eliquis and the Plavix.  I would think that the Plavix probably will be stopped after taking this for a year.  We will plan to get her back to see [place] in about 3 months.    Volanda Napoleon, MD 2/7/20241:06 PM

## 2022-06-21 LAB — IRON AND IRON BINDING CAPACITY (CC-WL,HP ONLY)
Iron: 58 ug/dL (ref 28–170)
Saturation Ratios: 13 % (ref 10.4–31.8)
TIBC: 448 ug/dL (ref 250–450)
UIBC: 390 ug/dL (ref 148–442)

## 2022-06-24 DIAGNOSIS — I110 Hypertensive heart disease with heart failure: Secondary | ICD-10-CM | POA: Diagnosis not present

## 2022-06-24 DIAGNOSIS — S81812A Laceration without foreign body, left lower leg, initial encounter: Secondary | ICD-10-CM | POA: Diagnosis not present

## 2022-06-24 DIAGNOSIS — I480 Paroxysmal atrial fibrillation: Secondary | ICD-10-CM | POA: Diagnosis not present

## 2022-06-24 DIAGNOSIS — I2111 ST elevation (STEMI) myocardial infarction involving right coronary artery: Secondary | ICD-10-CM | POA: Diagnosis not present

## 2022-06-24 DIAGNOSIS — I5032 Chronic diastolic (congestive) heart failure: Secondary | ICD-10-CM | POA: Diagnosis not present

## 2022-06-29 DIAGNOSIS — S81812A Laceration without foreign body, left lower leg, initial encounter: Secondary | ICD-10-CM | POA: Diagnosis not present

## 2022-06-29 DIAGNOSIS — I5032 Chronic diastolic (congestive) heart failure: Secondary | ICD-10-CM | POA: Diagnosis not present

## 2022-06-29 DIAGNOSIS — I2111 ST elevation (STEMI) myocardial infarction involving right coronary artery: Secondary | ICD-10-CM | POA: Diagnosis not present

## 2022-06-29 DIAGNOSIS — I480 Paroxysmal atrial fibrillation: Secondary | ICD-10-CM | POA: Diagnosis not present

## 2022-06-29 DIAGNOSIS — I110 Hypertensive heart disease with heart failure: Secondary | ICD-10-CM | POA: Diagnosis not present

## 2022-06-30 DIAGNOSIS — R918 Other nonspecific abnormal finding of lung field: Secondary | ICD-10-CM | POA: Diagnosis not present

## 2022-06-30 DIAGNOSIS — J449 Chronic obstructive pulmonary disease, unspecified: Secondary | ICD-10-CM | POA: Diagnosis not present

## 2022-06-30 DIAGNOSIS — J9601 Acute respiratory failure with hypoxia: Secondary | ICD-10-CM | POA: Diagnosis not present

## 2022-07-02 DIAGNOSIS — J9601 Acute respiratory failure with hypoxia: Secondary | ICD-10-CM | POA: Diagnosis not present

## 2022-07-02 DIAGNOSIS — I480 Paroxysmal atrial fibrillation: Secondary | ICD-10-CM | POA: Diagnosis not present

## 2022-07-02 DIAGNOSIS — I110 Hypertensive heart disease with heart failure: Secondary | ICD-10-CM | POA: Diagnosis not present

## 2022-07-02 DIAGNOSIS — I5032 Chronic diastolic (congestive) heart failure: Secondary | ICD-10-CM | POA: Diagnosis not present

## 2022-07-02 DIAGNOSIS — I2111 ST elevation (STEMI) myocardial infarction involving right coronary artery: Secondary | ICD-10-CM | POA: Diagnosis not present

## 2022-07-02 DIAGNOSIS — S81812A Laceration without foreign body, left lower leg, initial encounter: Secondary | ICD-10-CM | POA: Diagnosis not present

## 2022-07-02 DIAGNOSIS — I509 Heart failure, unspecified: Secondary | ICD-10-CM | POA: Diagnosis not present

## 2022-07-04 DIAGNOSIS — Z853 Personal history of malignant neoplasm of breast: Secondary | ICD-10-CM | POA: Diagnosis not present

## 2022-07-04 DIAGNOSIS — E785 Hyperlipidemia, unspecified: Secondary | ICD-10-CM | POA: Diagnosis not present

## 2022-07-04 DIAGNOSIS — I480 Paroxysmal atrial fibrillation: Secondary | ICD-10-CM | POA: Diagnosis not present

## 2022-07-04 DIAGNOSIS — I5032 Chronic diastolic (congestive) heart failure: Secondary | ICD-10-CM | POA: Diagnosis not present

## 2022-07-04 DIAGNOSIS — I110 Hypertensive heart disease with heart failure: Secondary | ICD-10-CM | POA: Diagnosis not present

## 2022-07-04 DIAGNOSIS — K219 Gastro-esophageal reflux disease without esophagitis: Secondary | ICD-10-CM | POA: Diagnosis not present

## 2022-07-07 DIAGNOSIS — R600 Localized edema: Secondary | ICD-10-CM | POA: Diagnosis not present

## 2022-07-07 DIAGNOSIS — R0602 Shortness of breath: Secondary | ICD-10-CM | POA: Diagnosis not present

## 2022-07-07 DIAGNOSIS — M79604 Pain in right leg: Secondary | ICD-10-CM | POA: Diagnosis not present

## 2022-07-07 DIAGNOSIS — N189 Chronic kidney disease, unspecified: Secondary | ICD-10-CM | POA: Diagnosis not present

## 2022-07-09 ENCOUNTER — Other Ambulatory Visit: Payer: Self-pay

## 2022-07-09 ENCOUNTER — Encounter (HOSPITAL_COMMUNITY): Payer: Self-pay

## 2022-07-09 ENCOUNTER — Emergency Department (HOSPITAL_COMMUNITY): Payer: Medicare Other

## 2022-07-09 ENCOUNTER — Emergency Department (HOSPITAL_COMMUNITY)
Admission: EM | Admit: 2022-07-09 | Discharge: 2022-07-09 | Disposition: A | Discharge status: Home / Self Care | Payer: Medicare Other | Attending: Emergency Medicine | Admitting: Emergency Medicine

## 2022-07-09 DIAGNOSIS — X58XXXA Exposure to other specified factors, initial encounter: Secondary | ICD-10-CM | POA: Insufficient documentation

## 2022-07-09 DIAGNOSIS — M7989 Other specified soft tissue disorders: Secondary | ICD-10-CM

## 2022-07-09 DIAGNOSIS — R609 Edema, unspecified: Secondary | ICD-10-CM | POA: Diagnosis not present

## 2022-07-09 DIAGNOSIS — S8011XA Contusion of right lower leg, initial encounter: Secondary | ICD-10-CM | POA: Insufficient documentation

## 2022-07-09 DIAGNOSIS — T148XXA Other injury of unspecified body region, initial encounter: Secondary | ICD-10-CM

## 2022-07-09 DIAGNOSIS — R2241 Localized swelling, mass and lump, right lower limb: Secondary | ICD-10-CM | POA: Diagnosis not present

## 2022-07-09 DIAGNOSIS — Z743 Need for continuous supervision: Secondary | ICD-10-CM | POA: Diagnosis not present

## 2022-07-09 DIAGNOSIS — M79604 Pain in right leg: Secondary | ICD-10-CM | POA: Diagnosis not present

## 2022-07-09 LAB — CBC WITH DIFFERENTIAL/PLATELET
Abs Immature Granulocytes: 0.02 10*3/uL (ref 0.00–0.07)
Basophils Absolute: 0.1 10*3/uL (ref 0.0–0.1)
Basophils Relative: 1 %
Eosinophils Absolute: 0.1 10*3/uL (ref 0.0–0.5)
Eosinophils Relative: 2 %
HCT: 41.2 % (ref 36.0–46.0)
Hemoglobin: 12.7 g/dL (ref 12.0–15.0)
Immature Granulocytes: 0 %
Lymphocytes Relative: 9 %
Lymphs Abs: 0.6 10*3/uL — ABNORMAL LOW (ref 0.7–4.0)
MCH: 26.8 pg (ref 26.0–34.0)
MCHC: 30.8 g/dL (ref 30.0–36.0)
MCV: 86.9 fL (ref 80.0–100.0)
Monocytes Absolute: 0.6 10*3/uL (ref 0.1–1.0)
Monocytes Relative: 10 %
Neutro Abs: 5.2 10*3/uL (ref 1.7–7.7)
Neutrophils Relative %: 78 %
Platelets: 466 10*3/uL — ABNORMAL HIGH (ref 150–400)
RBC: 4.74 MIL/uL (ref 3.87–5.11)
RDW: 15 % (ref 11.5–15.5)
WBC: 6.6 10*3/uL (ref 4.0–10.5)
nRBC: 0 % (ref 0.0–0.2)

## 2022-07-09 LAB — BASIC METABOLIC PANEL
Anion gap: 7 (ref 5–15)
BUN: 8 mg/dL (ref 8–23)
CO2: 31 mmol/L (ref 22–32)
Calcium: 8.7 mg/dL — ABNORMAL LOW (ref 8.9–10.3)
Chloride: 101 mmol/L (ref 98–111)
Creatinine, Ser: 0.96 mg/dL (ref 0.44–1.00)
GFR, Estimated: 56 mL/min — ABNORMAL LOW (ref >60)
Glucose, Bld: 104 mg/dL — ABNORMAL HIGH (ref 70–99)
Potassium: 3 mmol/L — ABNORMAL LOW (ref 3.5–5.1)
Sodium: 139 mmol/L (ref 135–145)

## 2022-07-09 IMAGING — CT CT HEAD W/O CM
4 series · 16 of 47 positions shown, 18 images · non-contrast
Comparison: Prior head CT 11/19/2019.

CLINICAL DATA: Head trauma, minor. Additional history provided:
Unwitnessed fall.

EXAM:
CT HEAD WITHOUT CONTRAST
TECHNIQUE: Contiguous axial images were obtained from the base of the skull
through the vertex without intravenous contrast.

[Series 2: head wo · axial · 0.47mm/px · z∈[-91,+14]mm · 7 of 29 slices shown, 9 images]
[im 4/29  brain]
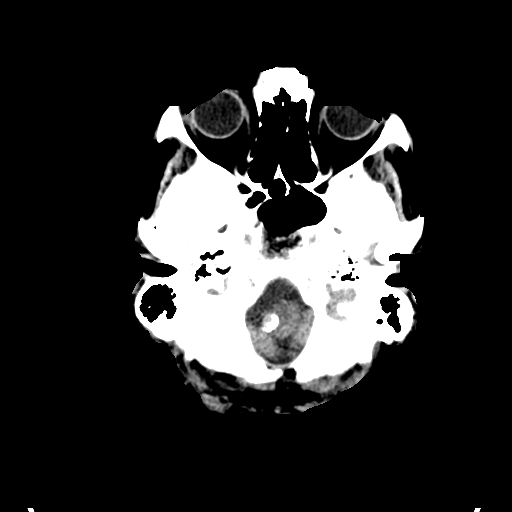
[im 4/29  bone]
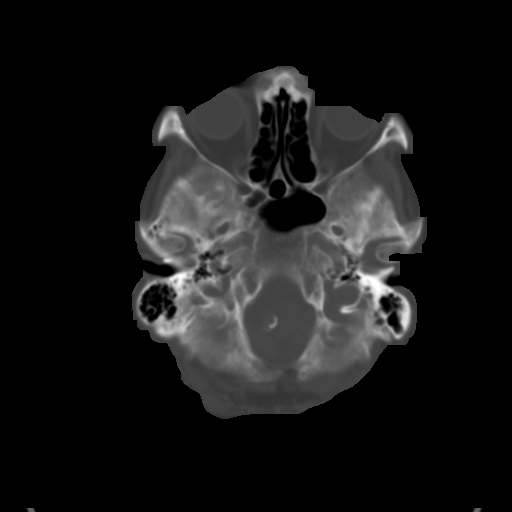
[im 8/29  brain]
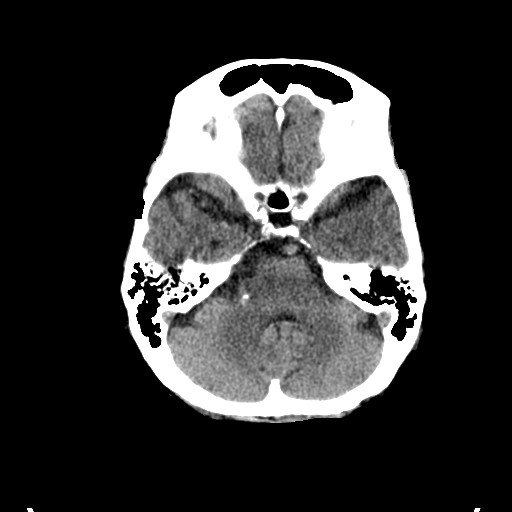
[im 11/29  brain]
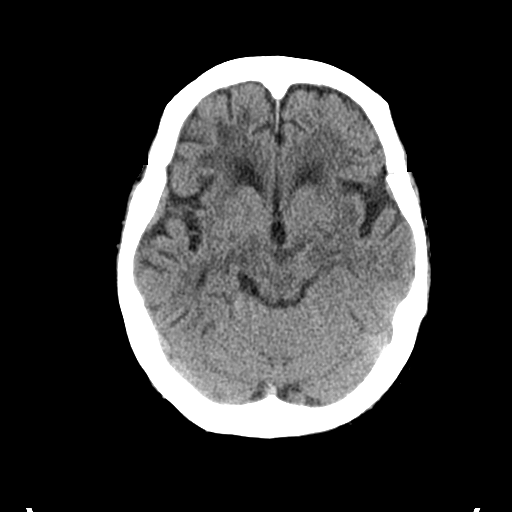
[im 15/29  brain]
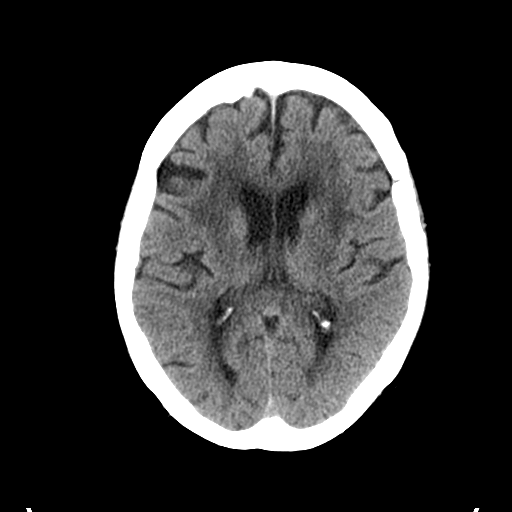
[im 18/29  brain]
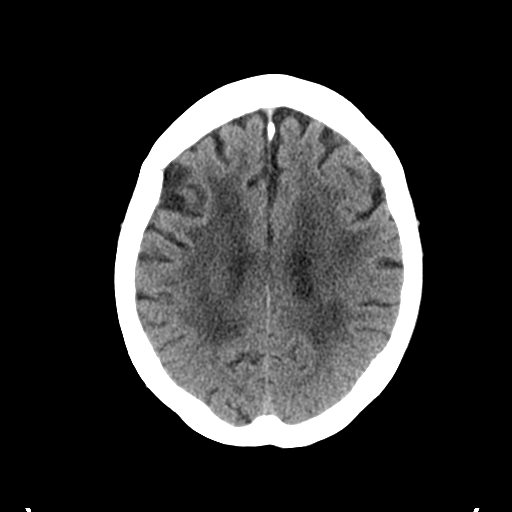
[im 18/29  bone]
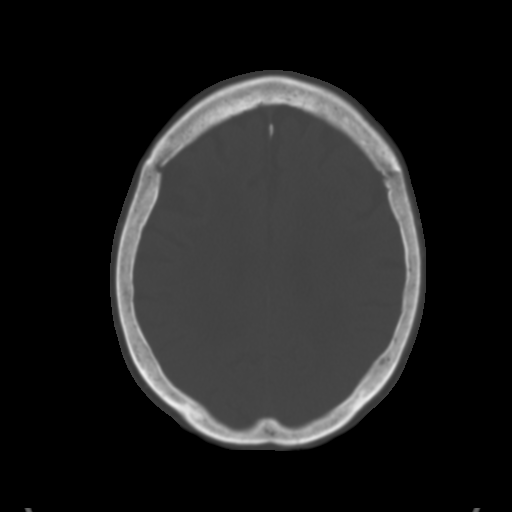
[im 22/29  brain]
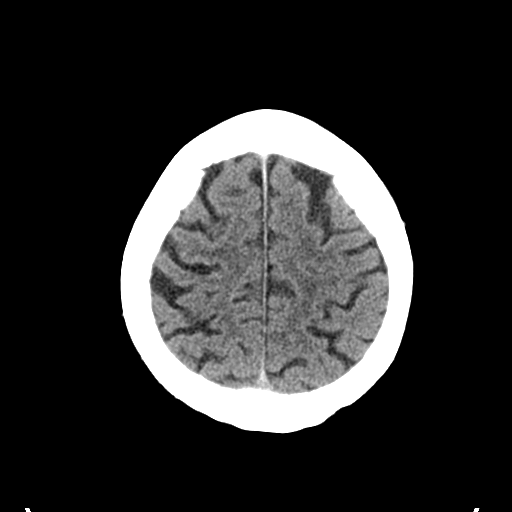
[im 25/29  brain]
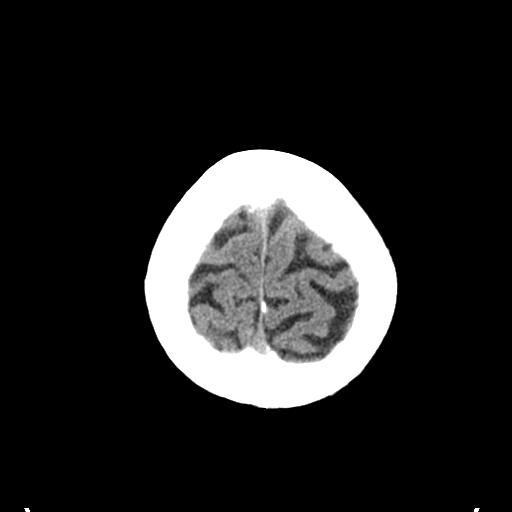

[Series 3: head bone · axial · 0.47mm/px · z∈[-92,-64]mm · 3 of 73 slices shown]
[im 8/73  bone]
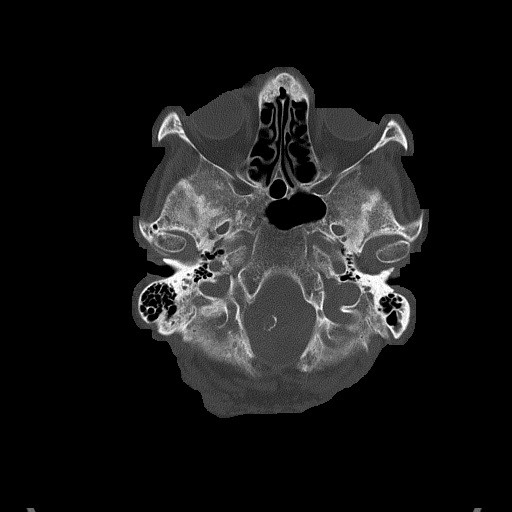
[im 15/73  bone]
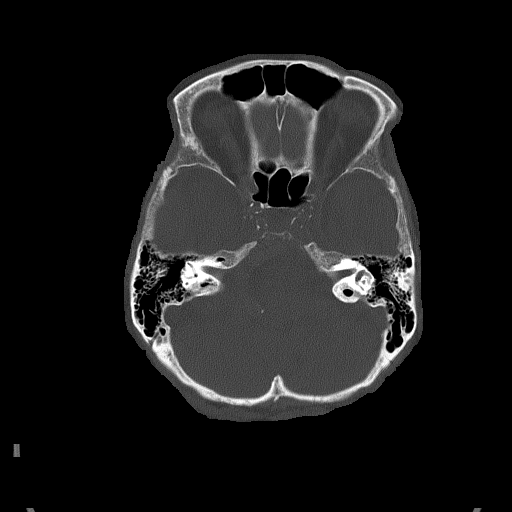
[im 22/73  bone]
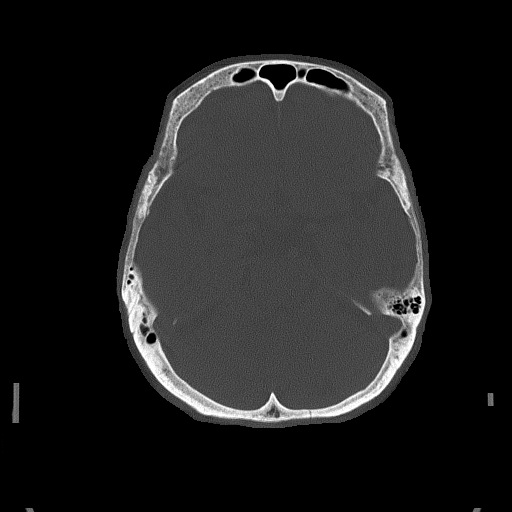

[Series 4: coronal soft tissue · coronal · 0.29mm/px · 3 of 63 slices shown]
[im 21/63  brain]
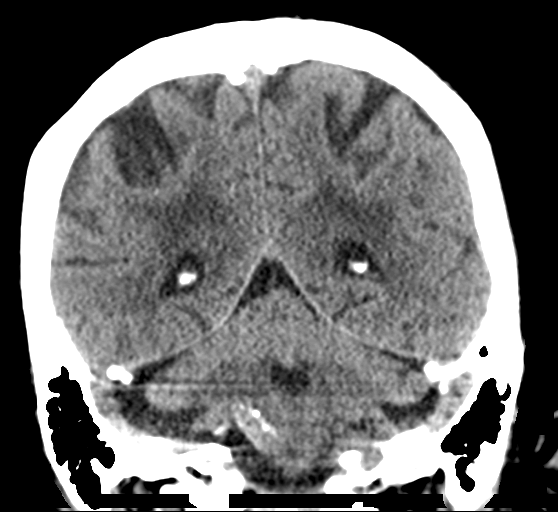
[im 28/63  brain]
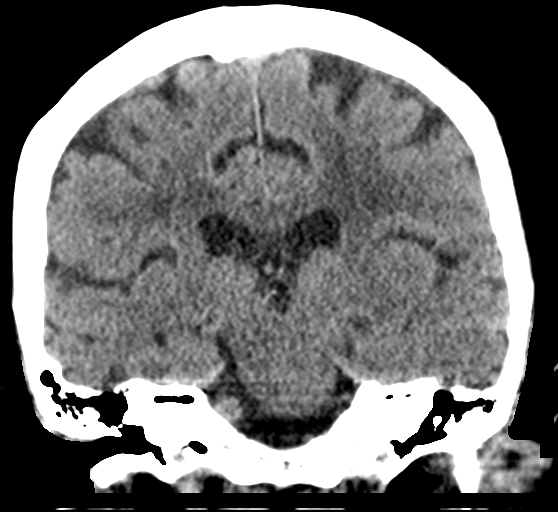
[im 35/63  brain]
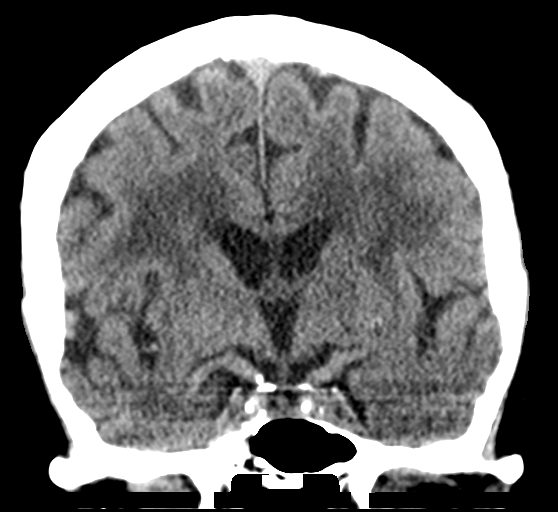

[Series 5: sagittal soft tissue · sagittal · 0.29mm/px · 3 of 51 slices shown]
[im 17/51  brain]
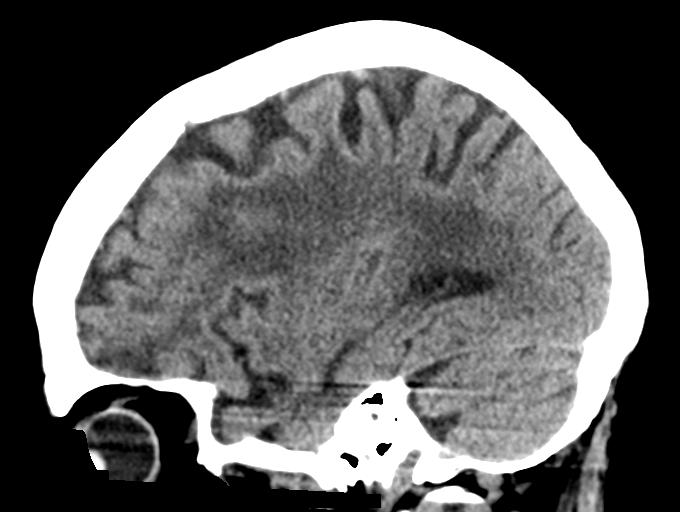
[im 26/51  brain]
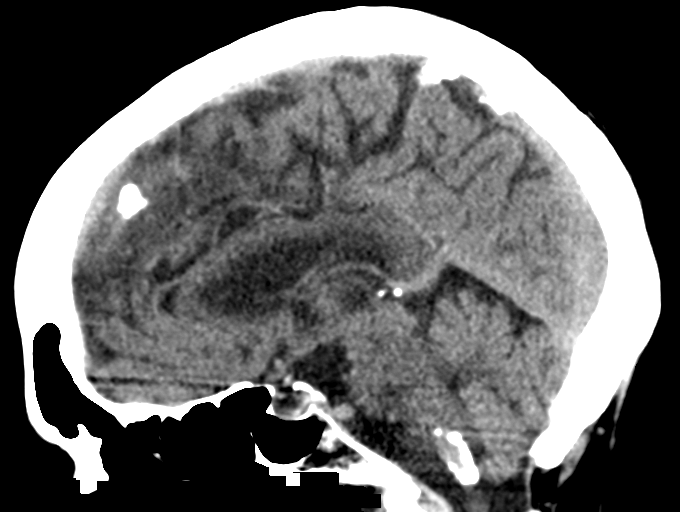
[im 34/51  brain]
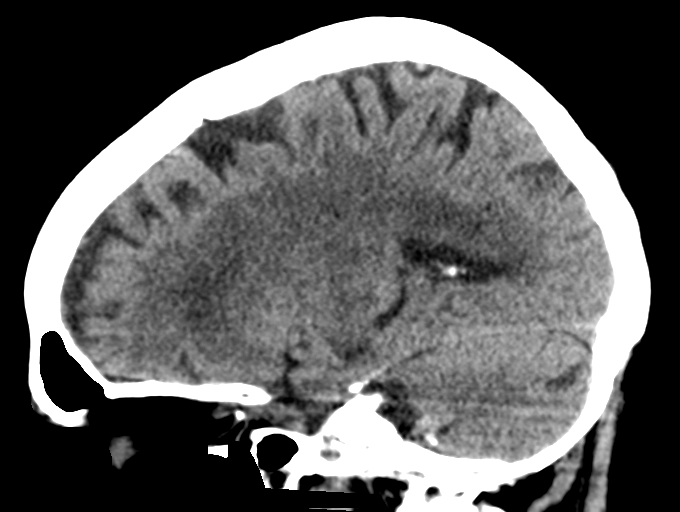

[16 of 47 positions shown; findings below may reference images not displayed]

FINDINGS: Brain:

Stable, mild generalized parenchymal atrophy.

Redemonstrated encephalomalacia within the right temporal lobe and
anteroinferior right frontal lobe at site of prior contusions.

Redemonstrated chronic infarcts within the right thalamus and left
cerebellum.

Stable background moderate/advanced patchy and confluent
hypoattenuation within the cerebral white matter which is
nonspecific, but consistent with chronic small vessel ischemic
disease.

There is no acute intracranial hemorrhage.

No acute demarcated cortical infarct is identified.

No extra-axial fluid collection.

No evidence of intracranial mass.

No midline shift.

Vascular: No hyperdense vessel.  Atherosclerotic calcifications.

Skull: Redemonstrated subacute nondepressed left parietooccipital
calvarial fracture. No interval calvarial fracture is identified.

Sinuses/Orbits: Visualized orbits show no acute finding. Minimal
ethmoid sinus mucosal thickening. No significant mastoid effusion.
IMPRESSION: No CT evidence of acute intracranial abnormality.

Encephalomalacia at sites of prior right temporal and frontal lobe
parenchymal contusions.

Redemonstrated subacute nondepressed left parietooccipital calvarial
fracture.

Stable generalized parenchymal atrophy and chronic ischemic changes
as described.

Minimal ethmoid sinus mucosal thickening.

## 2022-07-09 MED ORDER — POTASSIUM CHLORIDE 20 MEQ PO PACK
40.0000 meq | PACK | Freq: Once | ORAL | Status: AC
  Administered 2022-07-09: 40 meq via ORAL
  Filled 2022-07-09: qty 2

## 2022-07-09 NOTE — ED Triage Notes (Addendum)
Pt bib EMS from [place] with complaint of increased swelling and bruising of lower extremities over past week, denies increased SOB (chronically SOBx1 yr).  Hx of CHF, compliant with meds.  Vitals with EMS:  BP 130/78, HR 60, SpO2 97% on RA, lungs clear.

## 2022-07-09 NOTE — ED Provider Notes (Signed)
[facility] Provider Note   CSN: [id number] Arrival date & time: 07/09/22  1121     History  Chief Complaint  Patient presents with   Leg Swelling    [name] is a 89 y.o. female.  89 yo F with a cc of R leg swelling.  Going on for about a week.  The patient tells me that she noticed it and had some darkening to the skin.  Has been taking her home medications as prescribed.  Denies blood thinner use.  Denies injury to the leg.        Home Medications Prior to Admission medications   Medication Sig Start Date End Date Taking? Authorizing Provider  acetaminophen (TYLENOL) 325 MG tablet Take 650 mg by mouth every 6 (six) hours as needed for mild pain.    [provider]  alendronate (FOSAMAX) 70 MG tablet Take 1 tablet (70 mg total) by mouth once a week. Take with a full glass of water on an empty stomach. 12/19/21   Colon Branch, MD  amiodarone (PACERONE) 200 MG tablet Take 200 mg by mouth daily. 06/18/22   [provider]  amLODipine (NORVASC) 5 MG tablet Take 1 tablet (5 mg total) by mouth daily. 12/19/21   Colon Branch, MD  apixaban (ELIQUIS) 2.5 MG TABS tablet Take 1 tablet (2.5 mg total) by mouth 2 (two) times daily. 02/28/22   Cheryln Manly, NP  clopidogrel (PLAVIX) 75 MG tablet Take 1 tablet (75 mg total) by mouth daily. 03/01/22   Cheryln Manly, NP  furosemide (LASIX) 20 MG tablet Take 1 tablet (20 mg total) by mouth daily as needed for fluid or edema (for weigh gain of 3 pounds or more over one day). 02/28/22   Cheryln Manly, NP  LORazepam (ATIVAN) 0.5 MG tablet Take 1 tablet (0.5 mg total) by mouth 2 (two) times daily as needed for anxiety. 02/01/22   Colon Branch, MD  metoprolol tartrate (LOPRESSOR) 50 MG tablet Take 1 tablet (50 mg total) by mouth 2 (two) times daily. 12/19/21   Colon Branch, MD  nitroGLYCERIN (NITROSTAT) 0.4 MG SL tablet Place 1 tablet (0.4 mg total) under the tongue every 5  (five) minutes x 3 doses as needed for chest pain. 02/28/22   Cheryln Manly, NP  ondansetron (ZOFRAN) 4 MG tablet Take 1 tablet (4 mg total) by mouth every 8 (eight) hours as needed for nausea or vomiting. 02/14/22   Colon Branch, MD  pantoprazole (PROTONIX) 40 MG tablet Take 1 tablet (40 mg total) by mouth at bedtime. 02/28/22   Cheryln Manly, NP  rosuvastatin (CRESTOR) 20 MG tablet Take 1 tablet (20 mg total) by mouth daily. 03/01/22   Cheryln Manly, NP  sertraline (ZOLOFT) 25 MG tablet Take 25 mg by mouth daily. 06/18/22   [provider]      Allergies    Penicillins, Valsartan, Atorvastatin, Carvedilol, Ezetimibe, Fluvastatin sodium, Magnesium hydroxide, Meloxicam, Pneumovax [pneumococcal polysaccharide vaccine], Quinapril hcl, Simvastatin, Topamax [topiramate], and Vit d-vit e-safflower oil    Review of Systems   Review of Systems  Physical Exam Updated Vital Signs BP (!) 149/76   Pulse (!) 54   Temp 98.1 F (36.7 C) (Oral)   Resp 18   Ht '5\' 6"'$  (1.676 m)   Wt 54.4 kg   SpO2 91%   BMI 19.37 kg/m  Physical Exam Vitals and nursing note reviewed.  Constitutional:  General: She is not in acute distress.    Appearance: She is well-developed. She is not diaphoretic.  HENT:     Head: Normocephalic and atraumatic.  Eyes:     Pupils: Pupils are equal, round, and reactive to light.  Cardiovascular:     Rate and Rhythm: Normal rate and regular rhythm.     Heart sounds: No murmur heard.    No friction rub. No gallop.  Pulmonary:     Effort: Pulmonary effort is normal.     Breath sounds: No wheezing or rales.  Abdominal:     General: There is no distension.     Palpations: Abdomen is soft.     Tenderness: There is no abdominal tenderness.  Musculoskeletal:        General: No tenderness.     Cervical back: Normal range of motion and neck supple.     Comments: Darkening of the skin to the medial aspect of the right shin with some surrounding erythema.  No  obvious fluctuance or induration.  Intact dorsalis pedis pulse.  Skin:    General: Skin is warm and dry.  Neurological:     Mental Status: She is alert and oriented to person, place, and time.  Psychiatric:        Behavior: Behavior normal.     ED Results / Procedures / Treatments   Labs (all labs ordered are listed, but only abnormal results are displayed) Labs Reviewed  CBC WITH DIFFERENTIAL/PLATELET - Abnormal; Notable for the following components:      Result Value   Platelets 466 (*)    Lymphs Abs 0.6 (*)    All other components within normal limits  BASIC METABOLIC PANEL - Abnormal; Notable for the following components:   Potassium 3.0 (*)    Glucose, Bld 104 (*)    Calcium 8.7 (*)    GFR, Estimated 56 (*)    All other components within normal limits    EKG None  Radiology DG Tibia/Fibula Right  Result Date: 07/09/2022 CLINICAL DATA:  Right leg pain EXAM: RIGHT TIBIA AND FIBULA - 2 VIEW COMPARISON:  Right ankle radiographs 03/02/2009 FINDINGS: There is no acute fracture or dislocation. Bony alignment is normal. There is inferior calcaneal spurring. The soft tissues are unremarkable. IMPRESSION: No acute finding in the tibia/fibula. Electronically Signed   By: Valetta Mole M.D.   On: 07/09/2022 13:00    Procedures Procedures    Medications Ordered in ED Medications  potassium chloride (KLOR-CON) packet 40 mEq (40 mEq Oral Given 07/09/22 1308)    ED Course/ Medical Decision Making/ A&P                             Medical Decision Making Amount and/or Complexity of Data Reviewed Labs: ordered. Radiology: ordered.  Risk Prescription drug management.   89 yo F with a chief complaints of right leg pain and swelling.  Clinically patient looks to have an obvious hematoma to the right shin.  She denies injury.  Will obtain a plain film.  Basic blood work as she has a history of polycythemia.  Reassess.  Plain film of the tib-fib independently interpreted by me  without fracture.  Patient CBC without concerning finding, mild thrombocytosis.  Her potassium is mildly low.  Will replenish here.  Encouraged her to eat seafood high in potassium with each meal we will have her have that rechecked hopefully in the office in about a week.  2:29 PM:  I have discussed the diagnosis/risks/treatment options with the patient.  Evaluation and diagnostic testing in the emergency department does not suggest an emergent condition requiring admission or immediate intervention beyond what has been performed at this time.  They will follow up with PCP. We also discussed returning to the ED immediately if new or worsening sx occur. We discussed the sx which are most concerning (e.g., sudden worsening pain, fever, inability to tolerate by mouth) that necessitate immediate return. Medications administered to the patient during their visit and any new prescriptions provided to the patient are listed below.  Medications given during this visit Medications  potassium chloride (KLOR-CON) packet 40 mEq (40 mEq Oral Given 07/09/22 1308)     The patient appears reasonably screen and/or stabilized for discharge and I doubt any other medical condition or other [facility] requiring further screening, evaluation, or treatment in the ED at this time prior to discharge.          Final Clinical Impression(s) / ED Diagnoses Final diagnoses:  Leg swelling  Hematoma    Rx / DC Orders ED Discharge Orders     None         Deno Etienne, DO 07/09/22 1429

## 2022-07-09 NOTE — Discharge Instructions (Signed)
I think most likely based on your exam that this is a small collection of blood under the skin.  Please return for rapid spreading redness reviewed about the fever.  Please follow-up with your family doctor in the office.  Your potassium was low here today.  Try to eat something high in potassium with each meal for the next week and have it rechecked by your family doctor.

## 2022-07-09 NOTE — ED Notes (Signed)
Purewick applied, warm blanket provided, no other needs verbalized at this time

## 2022-07-10 DIAGNOSIS — E1140 Type 2 diabetes mellitus with diabetic neuropathy, unspecified: Secondary | ICD-10-CM | POA: Diagnosis not present

## 2022-07-10 DIAGNOSIS — I10 Essential (primary) hypertension: Secondary | ICD-10-CM | POA: Diagnosis not present

## 2022-07-10 IMAGING — RF DG HIP (WITH PELVIS) OPERATIVE*L*
1 series · 2 of 2 positions shown · non-contrast
Comparison: 12/24/2019

CLINICAL DATA: IM nail

EXAM:
OPERATIVE LEFT HIP (WITH PELVIS IF PERFORMED) 2 VIEWS
TECHNIQUE: Fluoroscopic spot image(s) were submitted for interpretation
post-operatively.

[Series 1: unknown protocol · 0.14mm/px · 2 of 2 slices shown]
[im 1/2]
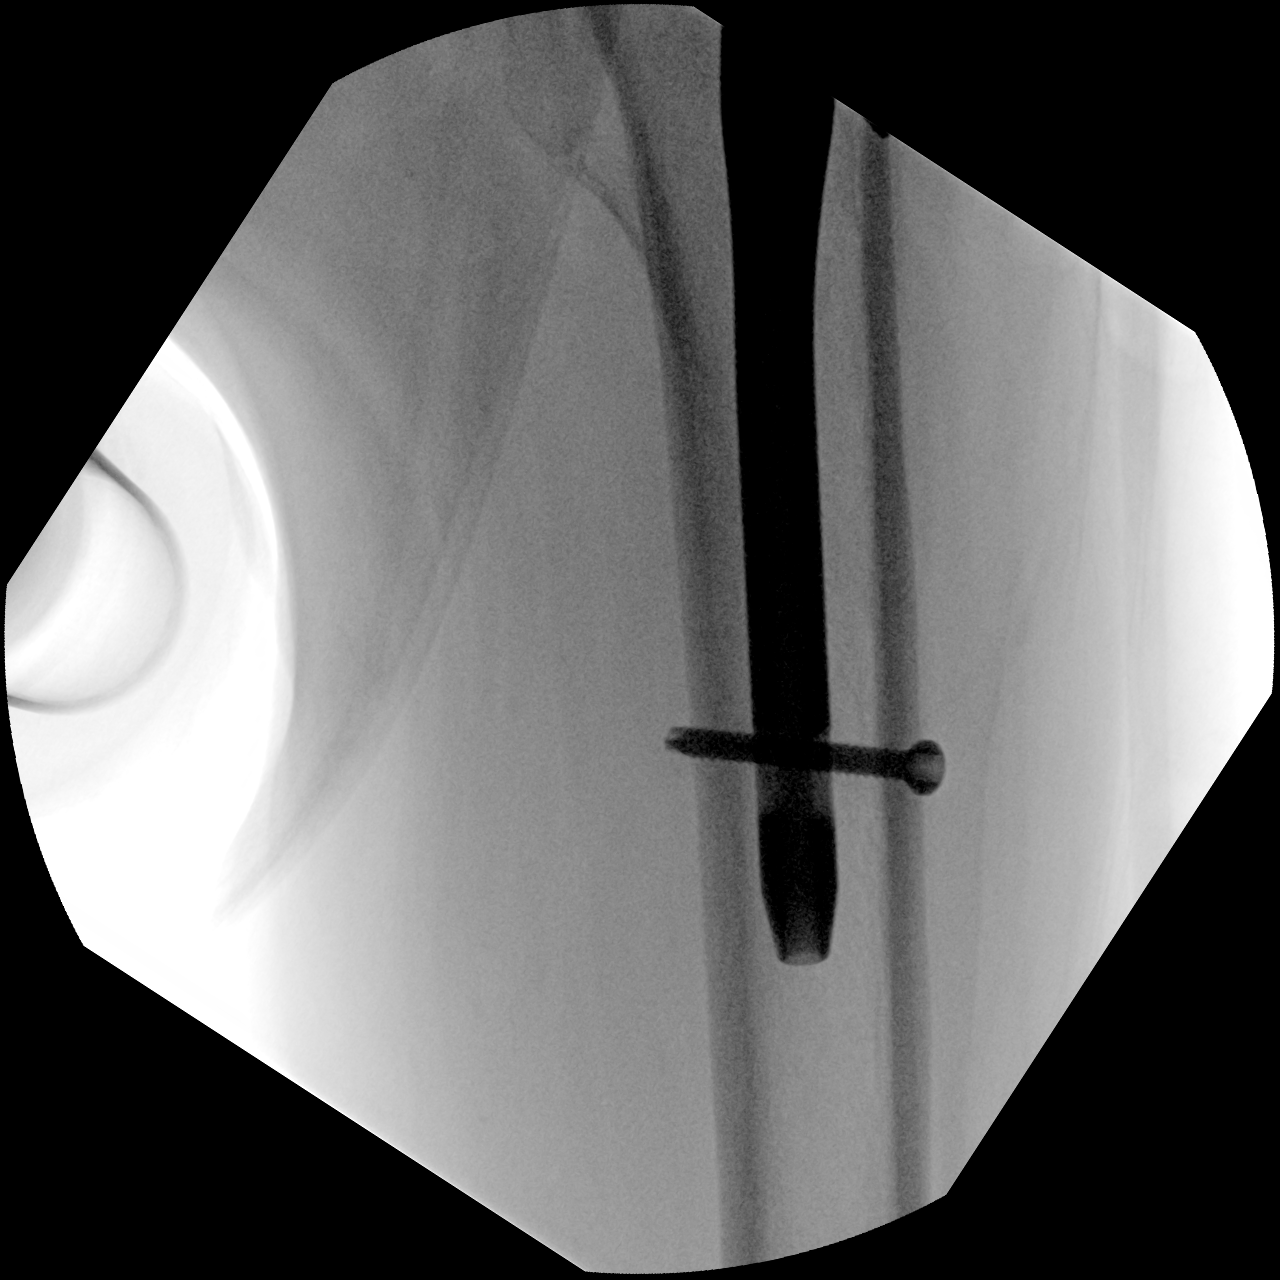
[im 2/2]
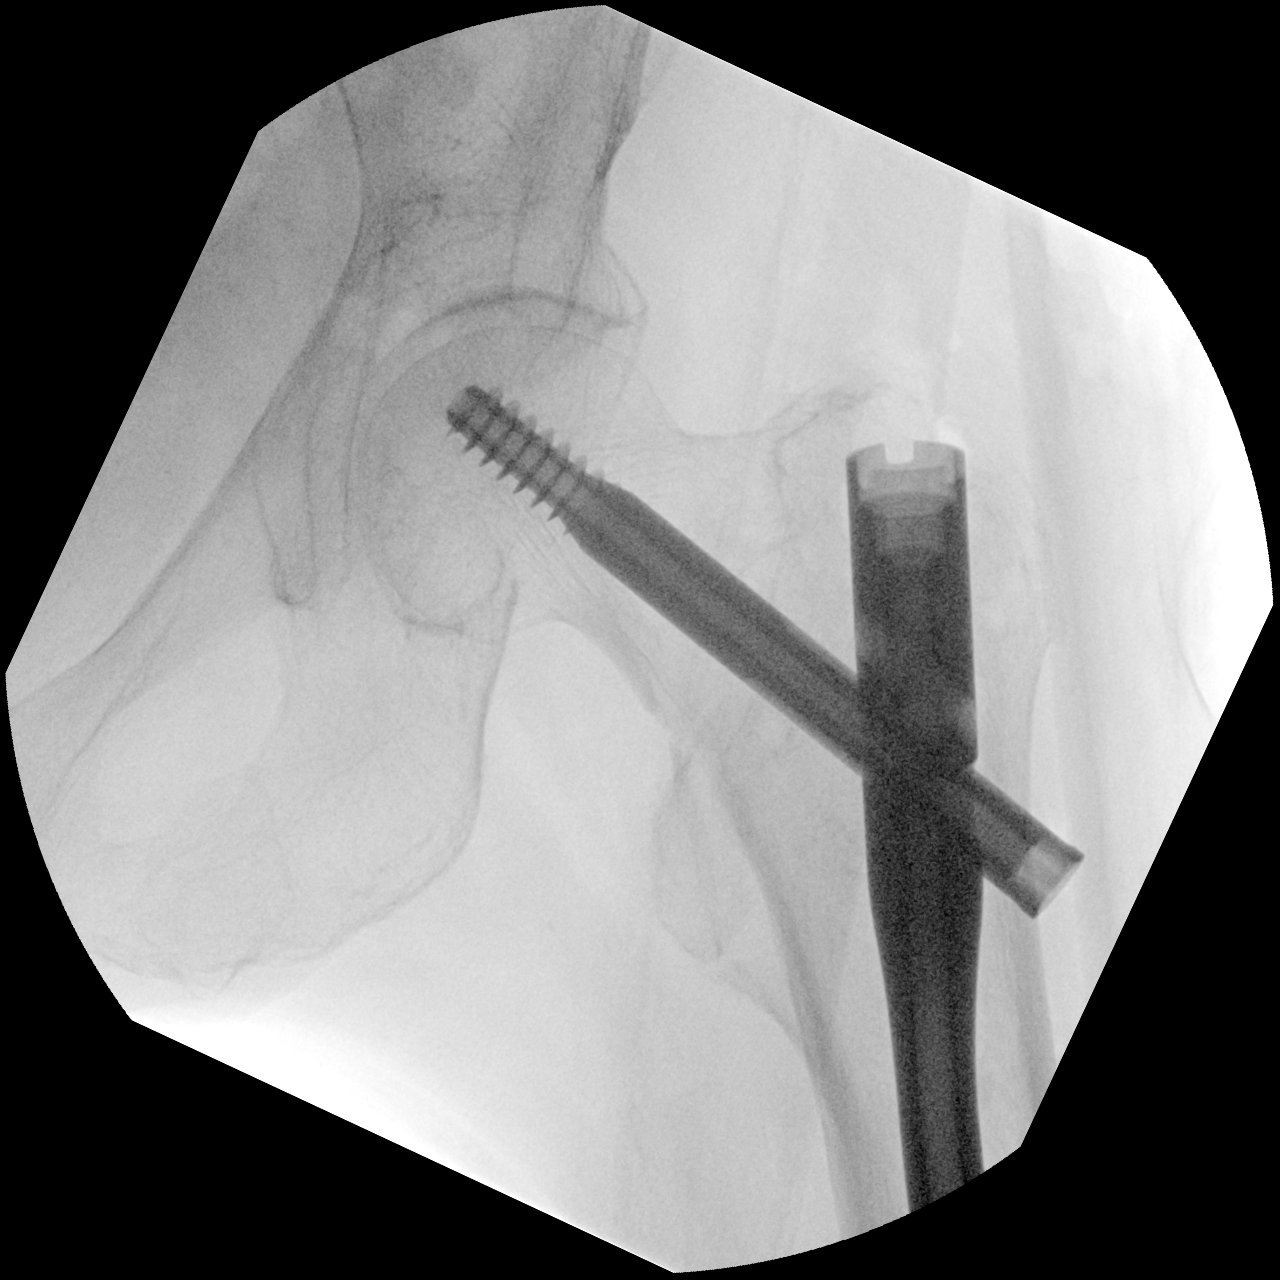

[2 of 2 positions shown; findings below may reference images not displayed]

FINDINGS: Placement of IM nail and dynamic hip screw across the proximal left
femoral fracture. No hardware complicating feature. Anatomic
alignment.
IMPRESSION: Internal fixation.  No visible complicating feature.

## 2022-07-11 DIAGNOSIS — I480 Paroxysmal atrial fibrillation: Secondary | ICD-10-CM | POA: Diagnosis not present

## 2022-07-11 DIAGNOSIS — I110 Hypertensive heart disease with heart failure: Secondary | ICD-10-CM | POA: Diagnosis not present

## 2022-07-11 DIAGNOSIS — I5032 Chronic diastolic (congestive) heart failure: Secondary | ICD-10-CM | POA: Diagnosis not present

## 2022-07-11 DIAGNOSIS — I2111 ST elevation (STEMI) myocardial infarction involving right coronary artery: Secondary | ICD-10-CM | POA: Diagnosis not present

## 2022-07-11 DIAGNOSIS — S81812A Laceration without foreign body, left lower leg, initial encounter: Secondary | ICD-10-CM | POA: Diagnosis not present

## 2022-07-12 DIAGNOSIS — I2111 ST elevation (STEMI) myocardial infarction involving right coronary artery: Secondary | ICD-10-CM | POA: Diagnosis not present

## 2022-07-16 ENCOUNTER — Telehealth: Payer: Self-pay

## 2022-07-16 DIAGNOSIS — I110 Hypertensive heart disease with heart failure: Secondary | ICD-10-CM | POA: Diagnosis not present

## 2022-07-16 DIAGNOSIS — I480 Paroxysmal atrial fibrillation: Secondary | ICD-10-CM | POA: Diagnosis not present

## 2022-07-16 DIAGNOSIS — S81812A Laceration without foreign body, left lower leg, initial encounter: Secondary | ICD-10-CM | POA: Diagnosis not present

## 2022-07-16 DIAGNOSIS — I2111 ST elevation (STEMI) myocardial infarction involving right coronary artery: Secondary | ICD-10-CM | POA: Diagnosis not present

## 2022-07-16 DIAGNOSIS — I5032 Chronic diastolic (congestive) heart failure: Secondary | ICD-10-CM | POA: Diagnosis not present

## 2022-07-16 NOTE — Telephone Encounter (Signed)
        Patient  visited [facility] on 2/26     Telephone encounter attempt :  1st  A HIPAA compliant voice message was left requesting a return call.  Instructed patient to call back .   Julieann Drummonds [facility] Care Guide, [place] [phone] [address], [place], [place] [address] Phone: [phone] Email: [email or web address]        

## 2022-07-18 DIAGNOSIS — I10 Essential (primary) hypertension: Secondary | ICD-10-CM | POA: Diagnosis not present

## 2022-07-18 DIAGNOSIS — I480 Paroxysmal atrial fibrillation: Secondary | ICD-10-CM | POA: Diagnosis not present

## 2022-07-18 DIAGNOSIS — D485 Neoplasm of uncertain behavior of skin: Secondary | ICD-10-CM | POA: Diagnosis not present

## 2022-07-23 ENCOUNTER — Other Ambulatory Visit: Payer: Self-pay

## 2022-07-23 ENCOUNTER — Inpatient Hospital Stay (HOSPITAL_COMMUNITY)
Admission: EM | Admit: 2022-07-23 | Discharge: 2022-07-28 | DRG: 481 | Disposition: A | Discharge status: Skilled Nursing Facility | Payer: Medicare Other | Attending: Internal Medicine | Admitting: Internal Medicine

## 2022-07-23 ENCOUNTER — Emergency Department (HOSPITAL_COMMUNITY): Payer: Medicare Other

## 2022-07-23 DIAGNOSIS — F17200 Nicotine dependence, unspecified, uncomplicated: Secondary | ICD-10-CM | POA: Diagnosis not present

## 2022-07-23 DIAGNOSIS — K219 Gastro-esophageal reflux disease without esophagitis: Secondary | ICD-10-CM | POA: Diagnosis not present

## 2022-07-23 DIAGNOSIS — S22010A Wedge compression fracture of first thoracic vertebra, initial encounter for closed fracture: Secondary | ICD-10-CM | POA: Diagnosis not present

## 2022-07-23 DIAGNOSIS — S42211A Unspecified displaced fracture of surgical neck of right humerus, initial encounter for closed fracture: Secondary | ICD-10-CM

## 2022-07-23 DIAGNOSIS — Z79899 Other long term (current) drug therapy: Secondary | ICD-10-CM

## 2022-07-23 DIAGNOSIS — C50919 Malignant neoplasm of unspecified site of unspecified female breast: Secondary | ICD-10-CM | POA: Diagnosis not present

## 2022-07-23 DIAGNOSIS — S2241XA Multiple fractures of ribs, right side, initial encounter for closed fracture: Secondary | ICD-10-CM

## 2022-07-23 DIAGNOSIS — F32A Depression, unspecified: Secondary | ICD-10-CM | POA: Diagnosis present

## 2022-07-23 DIAGNOSIS — Z955 Presence of coronary angioplasty implant and graft: Secondary | ICD-10-CM

## 2022-07-23 DIAGNOSIS — I69828 Other speech and language deficits following other cerebrovascular disease: Secondary | ICD-10-CM | POA: Diagnosis not present

## 2022-07-23 DIAGNOSIS — R531 Weakness: Secondary | ICD-10-CM | POA: Diagnosis not present

## 2022-07-23 DIAGNOSIS — I480 Paroxysmal atrial fibrillation: Secondary | ICD-10-CM | POA: Diagnosis present

## 2022-07-23 DIAGNOSIS — Z880 Allergy status to penicillin: Secondary | ICD-10-CM | POA: Diagnosis not present

## 2022-07-23 DIAGNOSIS — R2689 Other abnormalities of gait and mobility: Secondary | ICD-10-CM | POA: Diagnosis not present

## 2022-07-23 DIAGNOSIS — W19XXXA Unspecified fall, initial encounter: Secondary | ICD-10-CM | POA: Diagnosis not present

## 2022-07-23 DIAGNOSIS — Z853 Personal history of malignant neoplasm of breast: Secondary | ICD-10-CM | POA: Diagnosis not present

## 2022-07-23 DIAGNOSIS — I5032 Chronic diastolic (congestive) heart failure: Secondary | ICD-10-CM | POA: Diagnosis not present

## 2022-07-23 DIAGNOSIS — F0394 Unspecified dementia, unspecified severity, with anxiety: Secondary | ICD-10-CM | POA: Diagnosis present

## 2022-07-23 DIAGNOSIS — F17210 Nicotine dependence, cigarettes, uncomplicated: Secondary | ICD-10-CM | POA: Diagnosis present

## 2022-07-23 DIAGNOSIS — I252 Old myocardial infarction: Secondary | ICD-10-CM

## 2022-07-23 DIAGNOSIS — S72141D Displaced intertrochanteric fracture of right femur, subsequent encounter for closed fracture with routine healing: Secondary | ICD-10-CM | POA: Diagnosis not present

## 2022-07-23 DIAGNOSIS — Z96652 Presence of left artificial knee joint: Secondary | ICD-10-CM | POA: Diagnosis present

## 2022-07-23 DIAGNOSIS — I110 Hypertensive heart disease with heart failure: Secondary | ICD-10-CM | POA: Diagnosis present

## 2022-07-23 DIAGNOSIS — Z888 Allergy status to other drugs, medicaments and biological substances status: Secondary | ICD-10-CM

## 2022-07-23 DIAGNOSIS — S42291A Other displaced fracture of upper end of right humerus, initial encounter for closed fracture: Secondary | ICD-10-CM | POA: Diagnosis not present

## 2022-07-23 DIAGNOSIS — D500 Iron deficiency anemia secondary to blood loss (chronic): Secondary | ICD-10-CM | POA: Diagnosis not present

## 2022-07-23 DIAGNOSIS — S72141A Displaced intertrochanteric fracture of right femur, initial encounter for closed fracture: Principal | ICD-10-CM | POA: Diagnosis present

## 2022-07-23 DIAGNOSIS — Z7901 Long term (current) use of anticoagulants: Secondary | ICD-10-CM

## 2022-07-23 DIAGNOSIS — I2699 Other pulmonary embolism without acute cor pulmonale: Secondary | ICD-10-CM | POA: Diagnosis not present

## 2022-07-23 DIAGNOSIS — Z86711 Personal history of pulmonary embolism: Secondary | ICD-10-CM | POA: Diagnosis not present

## 2022-07-23 DIAGNOSIS — G4733 Obstructive sleep apnea (adult) (pediatric): Secondary | ICD-10-CM | POA: Diagnosis present

## 2022-07-23 DIAGNOSIS — S199XXA Unspecified injury of neck, initial encounter: Secondary | ICD-10-CM | POA: Diagnosis not present

## 2022-07-23 DIAGNOSIS — S42351A Displaced comminuted fracture of shaft of humerus, right arm, initial encounter for closed fracture: Secondary | ICD-10-CM | POA: Diagnosis not present

## 2022-07-23 DIAGNOSIS — I2510 Atherosclerotic heart disease of native coronary artery without angina pectoris: Secondary | ICD-10-CM | POA: Diagnosis present

## 2022-07-23 DIAGNOSIS — Z9181 History of falling: Secondary | ICD-10-CM | POA: Diagnosis not present

## 2022-07-23 DIAGNOSIS — F0393 Unspecified dementia, unspecified severity, with mood disturbance: Secondary | ICD-10-CM | POA: Diagnosis not present

## 2022-07-23 DIAGNOSIS — Z86718 Personal history of other venous thrombosis and embolism: Secondary | ICD-10-CM

## 2022-07-23 DIAGNOSIS — S72001A Fracture of unspecified part of neck of right femur, initial encounter for closed fracture: Secondary | ICD-10-CM

## 2022-07-23 DIAGNOSIS — E785 Hyperlipidemia, unspecified: Secondary | ICD-10-CM | POA: Diagnosis not present

## 2022-07-23 DIAGNOSIS — I69891 Dysphagia following other cerebrovascular disease: Secondary | ICD-10-CM | POA: Diagnosis not present

## 2022-07-23 DIAGNOSIS — I7300 Raynaud's syndrome without gangrene: Secondary | ICD-10-CM | POA: Diagnosis not present

## 2022-07-23 DIAGNOSIS — I509 Heart failure, unspecified: Secondary | ICD-10-CM | POA: Diagnosis not present

## 2022-07-23 DIAGNOSIS — Z8249 Family history of ischemic heart disease and other diseases of the circulatory system: Secondary | ICD-10-CM

## 2022-07-23 DIAGNOSIS — I2111 ST elevation (STEMI) myocardial infarction involving right coronary artery: Secondary | ICD-10-CM | POA: Diagnosis not present

## 2022-07-23 DIAGNOSIS — S2241XD Multiple fractures of ribs, right side, subsequent encounter for fracture with routine healing: Secondary | ICD-10-CM | POA: Diagnosis not present

## 2022-07-23 DIAGNOSIS — S42309A Unspecified fracture of shaft of humerus, unspecified arm, initial encounter for closed fracture: Secondary | ICD-10-CM

## 2022-07-23 DIAGNOSIS — D62 Acute posthemorrhagic anemia: Secondary | ICD-10-CM | POA: Diagnosis not present

## 2022-07-23 DIAGNOSIS — Z7983 Long term (current) use of bisphosphonates: Secondary | ICD-10-CM

## 2022-07-23 DIAGNOSIS — I10 Essential (primary) hypertension: Secondary | ICD-10-CM | POA: Diagnosis not present

## 2022-07-23 DIAGNOSIS — Z8673 Personal history of transient ischemic attack (TIA), and cerebral infarction without residual deficits: Secondary | ICD-10-CM

## 2022-07-23 DIAGNOSIS — Z803 Family history of malignant neoplasm of breast: Secondary | ICD-10-CM

## 2022-07-23 DIAGNOSIS — I428 Other cardiomyopathies: Secondary | ICD-10-CM | POA: Diagnosis not present

## 2022-07-23 DIAGNOSIS — Z743 Need for continuous supervision: Secondary | ICD-10-CM | POA: Diagnosis not present

## 2022-07-23 DIAGNOSIS — Z887 Allergy status to serum and vaccine status: Secondary | ICD-10-CM

## 2022-07-23 DIAGNOSIS — F341 Dysthymic disorder: Secondary | ICD-10-CM | POA: Diagnosis present

## 2022-07-23 DIAGNOSIS — S0990XA Unspecified injury of head, initial encounter: Secondary | ICD-10-CM | POA: Diagnosis not present

## 2022-07-23 DIAGNOSIS — M810 Age-related osteoporosis without current pathological fracture: Secondary | ICD-10-CM | POA: Diagnosis present

## 2022-07-23 DIAGNOSIS — R2681 Unsteadiness on feet: Secondary | ICD-10-CM | POA: Diagnosis not present

## 2022-07-23 DIAGNOSIS — Z7902 Long term (current) use of antithrombotics/antiplatelets: Secondary | ICD-10-CM

## 2022-07-23 DIAGNOSIS — S42291D Other displaced fracture of upper end of right humerus, subsequent encounter for fracture with routine healing: Secondary | ICD-10-CM | POA: Diagnosis not present

## 2022-07-23 DIAGNOSIS — Z7401 Bed confinement status: Secondary | ICD-10-CM | POA: Diagnosis not present

## 2022-07-23 DIAGNOSIS — W010XXA Fall on same level from slipping, tripping and stumbling without subsequent striking against object, initial encounter: Secondary | ICD-10-CM | POA: Diagnosis present

## 2022-07-23 DIAGNOSIS — Z8719 Personal history of other diseases of the digestive system: Secondary | ICD-10-CM

## 2022-07-23 DIAGNOSIS — M6281 Muscle weakness (generalized): Secondary | ICD-10-CM | POA: Diagnosis not present

## 2022-07-23 DIAGNOSIS — R0902 Hypoxemia: Secondary | ICD-10-CM | POA: Diagnosis not present

## 2022-07-23 DIAGNOSIS — S81812A Laceration without foreign body, left lower leg, initial encounter: Secondary | ICD-10-CM | POA: Diagnosis not present

## 2022-07-23 LAB — CBC WITH DIFFERENTIAL/PLATELET
Abs Immature Granulocytes: 0.04 10*3/uL (ref 0.00–0.07)
Basophils Absolute: 0.1 10*3/uL (ref 0.0–0.1)
Basophils Relative: 1 %
Eosinophils Absolute: 0.1 10*3/uL (ref 0.0–0.5)
Eosinophils Relative: 1 %
HCT: 42 % (ref 36.0–46.0)
Hemoglobin: 13.3 g/dL (ref 12.0–15.0)
Immature Granulocytes: 0 %
Lymphocytes Relative: 8 %
Lymphs Abs: 0.7 10*3/uL (ref 0.7–4.0)
MCH: 26.8 pg (ref 26.0–34.0)
MCHC: 31.7 g/dL (ref 30.0–36.0)
MCV: 84.5 fL (ref 80.0–100.0)
Monocytes Absolute: 0.8 10*3/uL (ref 0.1–1.0)
Monocytes Relative: 8 %
Neutro Abs: 7.8 10*3/uL — ABNORMAL HIGH (ref 1.7–7.7)
Neutrophils Relative %: 82 %
Platelets: 493 10*3/uL — ABNORMAL HIGH (ref 150–400)
RBC: 4.97 MIL/uL (ref 3.87–5.11)
RDW: 14.9 % (ref 11.5–15.5)
WBC: 9.6 10*3/uL (ref 4.0–10.5)
nRBC: 0 % (ref 0.0–0.2)

## 2022-07-23 LAB — I-STAT CHEM 8, ED
BUN: 15 mg/dL (ref 8–23)
Calcium, Ion: 1 mmol/L — ABNORMAL LOW (ref 1.15–1.40)
Chloride: 99 mmol/L (ref 98–111)
Creatinine, Ser: 1 mg/dL (ref 0.44–1.00)
Glucose, Bld: 142 mg/dL — ABNORMAL HIGH (ref 70–99)
HCT: 43 % (ref 36.0–46.0)
Hemoglobin: 14.6 g/dL (ref 12.0–15.0)
Potassium: 3.5 mmol/L (ref 3.5–5.1)
Sodium: 135 mmol/L (ref 135–145)
TCO2: 29 mmol/L (ref 22–32)

## 2022-07-23 NOTE — ED Notes (Signed)
Trauma Response Nurse Documentation   [name] is a 89 y.o. female arriving to [facility] via [facility]  On clopidogrel 75 mg daily. Trauma was activated as a Level 2 by Perley Jain based on the following trauma criteria Elderly patients > 65 with head trauma on anti-coagulation (excluding ASA). Trauma team at the bedside on patient arrival.   Patient cleared for CT by Dr. Randal Buba. Pt transported to CT with trauma response nurse present to monitor. RN remained with the patient throughout their absence from the department for clinical observation.   GCS 15.  History   Past Medical History:  Diagnosis Date   CAD (coronary artery disease)    mild nonobstructive disease on cath in 2003   Cancer [facility])    Cardiomyopathy    Probable Takotsubo, severe CP w/ normal cath in 1994. Severe CP in 2003 w/ widespread T wave inversions on ECG. Cath w/ minimal coronary disease but LV-gram showed periapical severe hypokinesis and basilar hyperkinesia (EF 40%). Last echo in 4/09 confirmed full LV functional recovery with EF 60%, no regional wall motion abnormalities, mild to moderate LVH.   Chronic diastolic CHF (congestive heart failure) ([place]) 09/13/2019   CVA (cerebral infarction)    Depression with anxiety 03/29/2011   lost husband 3'09   DVT (deep venous thrombosis) ([place])    after venous ablation, R leg   Fracture 09/10/2007   L2, status post vertebroplasty of L2 performed by IR   GERD (gastroesophageal reflux disease)    Hemorrhoids    Hiatal hernia 03/29/2011   no nerve problems   Hyperlipidemia    Hyperplastic colon polyp 06/2001   Hypertension    Iron deficiency anemia due to chronic blood loss 12/30/2017   OA (osteoarthritis) of knee 03/29/2011   w/ bilateral knee pain-not a problem now   OSA (obstructive sleep apnea) 03/29/2011   no cpap used, not a problem now.   Osteoporosis    Otalgia of both ears    Dr. Simeon Craft   Polycythemia    Stroke [facility]) 03/29/2011    CVA x2 -last 10'12-?TIA(visual problems)   Varicose vein      Past Surgical History:  Procedure Laterality Date   APPENDECTOMY     BACK SURGERY     BREAST BIOPSY     BREAST CYST EXCISION     BREAST LUMPECTOMY Left 2013   left stage I left breast cancer   CHOLECYSTECTOMY  04/09/2011   Procedure: LAPAROSCOPIC CHOLECYSTECTOMY WITH INTRAOPERATIVE CHOLANGIOGRAM;  Surgeon: Odis Hollingshead, MD;  Location: [facility];  Service: General;  Laterality: N/A;   CORONARY ATHERECTOMY N/A 02/26/2022   Procedure: CORONARY ATHERECTOMY;  Surgeon: Nelva Bush, MD;  Location: [place] CV [facility];  Service: Cardiovascular;  Laterality: N/A;   CORONARY LITHOTRIPSY N/A 02/26/2022   Procedure: CORONARY LITHOTRIPSY;  Surgeon: Nelva Bush, MD;  Location: [place] [facility];  Service: Cardiovascular;  Laterality: N/A;   CORONARY STENT INTERVENTION N/A 02/26/2022   Procedure: CORONARY STENT INTERVENTION;  Surgeon: Nelva Bush, MD;  Location: [place] [facility];  Service: Cardiovascular;  Laterality: N/A;   Dental Extraction     L maxillary molar   INTRAMEDULLARY (IM) NAIL INTERTROCHANTERIC Left 12/25/2019   Procedure: INTRAMEDULLARY (IM) NAIL INTERTROCHANTRIC;  Surgeon: Paralee Cancel, MD;  Location: [facility];  Service: Orthopedics;  Laterality: Left;   INTRAVASCULAR ULTRASOUND/IVUS N/A 02/26/2022   Procedure: Intravascular Ultrasound/IVUS;  Surgeon: Nelva Bush, MD;  Location: [place] [facility];  Service: Cardiovascular;  Laterality: N/A;   IR KYPHO THORACIC WITH BONE BIOPSY  09/15/2019   IR KYPHO THORACIC WITH BONE BIOPSY  07/13/2020       IR KYPHO THORACIC WITH BONE BIOPSY  07/13/2020   IR VERTEBROPLASTY CERV/THOR BX INC UNI/BIL INC/INJECT/IMAGING  11/10/2019   KYPHOPLASTY N/A 11/16/2020   Procedure: T11,  L1,KYPHOPLASTY;  Surgeon: Melina Schools, MD;  Location: [facility];  Service: Orthopedics;  Laterality: N/A;   KYPHOSIS SURGERY  08/2007   cement used   LEFT HEART CATH AND CORONARY ANGIOGRAPHY N/A  02/23/2022   Procedure: LEFT HEART CATH AND CORONARY ANGIOGRAPHY;  Surgeon: Martinique, Peter M, MD;  Location: [place] [facility];  Service: Cardiovascular;  Laterality: N/A;   LEFT HEART CATH AND CORONARY ANGIOGRAPHY N/A 02/26/2022   Procedure: LEFT HEART CATH AND CORONARY ANGIOGRAPHY;  Surgeon: Nelva Bush, MD;  Location: [place] [facility];  Service: Cardiovascular;  Laterality: N/A;   OVARIAN CYST SURGERY     left   RADIOLOGY WITH ANESTHESIA N/A 11/10/2019   Procedure: VERTEBROPLASTY;  Surgeon: Luanne Bras, MD;  Location: [place];  Service: Radiology;  Laterality: N/A;   TEMPORARY PACEMAKER N/A 02/26/2022   Procedure: TEMPORARY PACEMAKER;  Surgeon: Nelva Bush, MD;  Location: [place] [facility];  Service: Cardiovascular;  Laterality: N/A;   TONSILLECTOMY     TOTAL KNEE ARTHROPLASTY  06/2010   left   TUBAL LIGATION         Initial Focused Assessment (If applicable, or please see trauma documentation): Airway-- intact, no visible obstruction Breathing-- spontaneous, unlabored Circulation-- no obvious bleeding noted  CT's Completed:   CT Head and CT C-Spine   Interventions:  See event summary  Plan for disposition:  Admission to floor   Consults completed:  Orthopaedic Surgeon at 2358.  Event Summary: Patient brought in by [facility] from assisted living facility. Patient had a mechanical fall tonight. Complaining of neck, right shoulder, and right hip pain. 20 G PIV LAC established, lab work obtained. Xray chest and pelvis completed. Patient transported to CT by TRN. CT head, c-spine completed.  MTP Summary (If applicable):  N/A  Bedside handoff with ED RN Nani Gasser.    Trudee Kuster  Trauma Response RN  Please call TRN at [phone] for further assistance.

## 2022-07-23 NOTE — ED Triage Notes (Signed)
Pt BIBEMS s/p fall from standing position, c/o Rt neck, shoulder, hip pain. C-Collar in place on arrival. Pt  currently on Plavix

## 2022-07-23 NOTE — ED Provider Notes (Addendum)
[place] EMERGENCY DEPARTMENT AT [facility] Provider Note   CSN: [id number] Arrival date & time: 07/23/22  2251     History  Chief Complaint  Patient presents with   [name]    [name] [name] is a 89 y.o. female.  The history is provided by the EMS personnel and the patient.  Fall This is a new problem. The current episode started less than 1 hour ago. The problem occurs constantly. The problem has been resolved. Pertinent negatives include no chest pain and no abdominal pain. Nothing aggravates the symptoms. Nothing relieves the symptoms. Treatments tried: fentanyl. The treatment provided no relief.  Patient with extensive PMH including CAD had a mechanical fall at [facility] home on blood thinners.  Neck pain and shoulder pain and hip pain.  Given fentanyl en route.  .      Past Medical History:  Diagnosis Date   CAD (coronary artery disease)    mild nonobstructive disease on cath in 2003   Cancer [facility])    Cardiomyopathy    Probable Takotsubo, severe CP w/ normal cath in 1994. Severe CP in 2003 w/ widespread T wave inversions on ECG. Cath w/ minimal coronary disease but LV-gram showed periapical severe hypokinesis and basilar hyperkinesia (EF 40%). Last echo in 4/09 confirmed full LV functional recovery with EF 60%, no regional wall motion abnormalities, mild to moderate LVH.   Chronic diastolic CHF (congestive heart failure) (HCC) 09/13/2019   CVA (cerebral infarction)    Depression with anxiety 03/29/2011   lost husband 3'09   DVT (deep venous thrombosis) (HCC)    after venous ablation, R leg   Fracture 09/10/2007   L2, status post vertebroplasty of L2 performed by IR   GERD (gastroesophageal reflux disease)    Hemorrhoids    Hiatal hernia 03/29/2011   no nerve problems   Hyperlipidemia    Hyperplastic colon polyp 06/2001   Hypertension    Iron deficiency anemia due to chronic blood loss 12/30/2017   OA (osteoarthritis) of knee 03/29/2011   w/ bilateral knee  pain-not a problem now   OSA (obstructive sleep apnea) 03/29/2011   no cpap used, not a problem now.   Osteoporosis    Otalgia of both ears    Dr. Emeline Darling   Polycythemia    Stroke [facility]) 03/29/2011   CVA x2 -last 10'12-?TIA(visual problems)   Varicose vein      Home Medications Prior to Admission medications   Medication Sig Start Date End Date Taking? Authorizing Provider  acetaminophen (TYLENOL) 325 MG tablet Take 650 mg by mouth every 6 (six) hours as needed for mild pain.    [provider]  alendronate (FOSAMAX) 70 MG tablet Take 1 tablet (70 mg total) by mouth once a week. Take with a full glass of water on an empty stomach. 12/19/21   Wanda Plump, MD  amiodarone (PACERONE) 200 MG tablet Take 200 mg by mouth daily. 06/18/22   [provider]  amLODipine (NORVASC) 5 MG tablet Take 1 tablet (5 mg total) by mouth daily. 12/19/21   Wanda Plump, MD  apixaban (ELIQUIS) 2.5 MG TABS tablet Take 1 tablet (2.5 mg total) by mouth 2 (two) times daily. 02/28/22   Arty Baumgartner, NP  clopidogrel (PLAVIX) 75 MG tablet Take 1 tablet (75 mg total) by mouth daily. 03/01/22   Arty Baumgartner, NP  furosemide (LASIX) 20 MG tablet Take 1 tablet (20 mg total) by mouth daily as needed for fluid or  edema (for weigh gain of 3 pounds or more over one day). 02/28/22   Arty Baumgartner, NP  LORazepam (ATIVAN) 0.5 MG tablet Take 1 tablet (0.5 mg total) by mouth 2 (two) times daily as needed for anxiety. 02/01/22   Wanda Plump, MD  metoprolol tartrate (LOPRESSOR) 50 MG tablet Take 1 tablet (50 mg total) by mouth 2 (two) times daily. 12/19/21   Wanda Plump, MD  nitroGLYCERIN (NITROSTAT) 0.4 MG SL tablet Place 1 tablet (0.4 mg total) under the tongue every 5 (five) minutes x 3 doses as needed for chest pain. 02/28/22   Arty Baumgartner, NP  ondansetron (ZOFRAN) 4 MG tablet Take 1 tablet (4 mg total) by mouth every 8 (eight) hours as needed for nausea or vomiting. 02/14/22   Wanda Plump, MD   pantoprazole (PROTONIX) 40 MG tablet Take 1 tablet (40 mg total) by mouth at bedtime. 02/28/22   Arty Baumgartner, NP  rosuvastatin (CRESTOR) 20 MG tablet Take 1 tablet (20 mg total) by mouth daily. 03/01/22   Arty Baumgartner, NP  sertraline (ZOLOFT) 25 MG tablet Take 25 mg by mouth daily. 06/18/22   [provider]      Allergies    Penicillins, Valsartan, Atorvastatin, Carvedilol, Ezetimibe, Fluvastatin sodium, Magnesium hydroxide, Meloxicam, Pneumovax [pneumococcal polysaccharide vaccine], Quinapril hcl, Simvastatin, Topamax [topiramate], and Vit d-vit e-safflower oil    Review of Systems   Review of Systems  Unable to perform ROS: Acuity of condition  Constitutional:  Negative for fever.  Cardiovascular:  Negative for chest pain.  Gastrointestinal:  Negative for abdominal pain and vomiting.  Musculoskeletal:  Positive for arthralgias and neck pain.    Physical Exam Updated Vital Signs BP (!) 147/70   Pulse (!) 58   Temp 97.9 F (36.6 C)   Resp 18   SpO2 96%  Physical Exam Vitals and nursing note reviewed. Exam conducted with a chaperone present.  Constitutional:      General: She is not in acute distress.    Appearance: Normal appearance. She is well-developed.  HENT:     Head: Normocephalic.     Nose: Nose normal.     Mouth/Throat:     Mouth: Mucous membranes are moist.     Pharynx: Oropharynx is clear.  Eyes:     Pupils: Pupils are equal, round, and reactive to light.  Neck:     Comments: In C ollar trachea is midline  Cardiovascular:     Rate and Rhythm: Normal rate and regular rhythm.     Pulses: Normal pulses.     Heart sounds: Normal heart sounds.  Pulmonary:     Effort: Pulmonary effort is normal. No respiratory distress.     Breath sounds: Normal breath sounds.  Abdominal:     General: Bowel sounds are normal. There is no distension.     Palpations: Abdomen is soft.     Tenderness: There is no abdominal tenderness. There is no guarding or  rebound.  Genitourinary:    Vagina: No vaginal discharge.  Musculoskeletal:     Right shoulder: Tenderness present.     Cervical back: No tenderness. Normal range of motion.     Right hip: Deformity present.  Skin:    General: Skin is dry.     Capillary Refill: Capillary refill takes less than 2 seconds.     Findings: No erythema or rash.  Neurological:     General: No focal deficit present.     Mental Status:  She is alert.     Deep Tendon Reflexes: Reflexes normal.  Psychiatric:        Mood and Affect: Mood normal.     ED Results / Procedures / Treatments   Labs (all labs ordered are listed, but only abnormal results are displayed) Results for orders placed or performed during the hospital encounter of 07/23/22  CBC with Differential  Result Value Ref Range   WBC 9.6 4.0 - 10.5 K/uL   RBC 4.97 3.87 - 5.11 MIL/uL   Hemoglobin 13.3 12.0 - 15.0 g/dL   HCT 16.1 09.6 - 04.5 %   MCV 84.5 80.0 - 100.0 fL   MCH 26.8 26.0 - 34.0 pg   MCHC 31.7 30.0 - 36.0 g/dL   RDW 40.9 81.1 - 91.4 %   Platelets 493 (H) 150 - 400 K/uL   nRBC 0.0 0.0 - 0.2 %   Neutrophils Relative % 82 %   Neutro Abs 7.8 (H) 1.7 - 7.7 K/uL   Lymphocytes Relative 8 %   Lymphs Abs 0.7 0.7 - 4.0 K/uL   Monocytes Relative 8 %   Monocytes Absolute 0.8 0.1 - 1.0 K/uL   Eosinophils Relative 1 %   Eosinophils Absolute 0.1 0.0 - 0.5 K/uL   Basophils Relative 1 %   Basophils Absolute 0.1 0.0 - 0.1 K/uL   Immature Granulocytes 0 %   Abs Immature Granulocytes 0.04 0.00 - 0.07 K/uL  I-stat chem 8, ED (not at [facility], [facility] or [facility])  Result Value Ref Range   Sodium 135 135 - 145 mmol/L   Potassium 3.5 3.5 - 5.1 mmol/L   Chloride 99 98 - 111 mmol/L   BUN 15 8 - 23 mg/dL   Creatinine, Ser 7.82 0.44 - 1.00 mg/dL   Glucose, Bld 956 (H) 70 - 99 mg/dL   Calcium, Ion 2.13 (L) 1.15 - 1.40 mmol/L   TCO2 29 22 - 32 mmol/L   Hemoglobin 14.6 12.0 - 15.0 g/dL   HCT 08.6 57.8 - 46.9 %   *Note: Due to a large number of results  and/or encounters for the requested time period, some results have not been displayed. A complete set of results can be found in Results Review.   DG Shoulder Right  Result Date: 07/23/2022 CLINICAL DATA:  Fall EXAM: RIGHT SHOULDER - 2+ VIEW COMPARISON:  None Available. FINDINGS: AC joint appears intact with moderate degenerative change. Acute comminuted proximal humerus fracture involves the humeral neck and greater tuberosity. The humeral head appears to project over glenoid. About 1/2 shaft diameter anterior displacement of distal fracture fragment IMPRESSION: Acute comminuted and displaced proximal humerus fracture involving the humeral neck and greater tuberosity. Electronically Signed   By: Jasmine Pang M.D.   On: 07/23/2022 23:52   DG Chest Portable 1 View  Result Date: 07/23/2022 CLINICAL DATA:  Fall EXAM: PORTABLE CHEST 1 VIEW COMPARISON:  02/06/2022, CT 10/01/2021 FINDINGS: Acute comminuted proximal right humerus fracture. No acute airspace disease or pleural effusion. Retrocardiac opacity corresponding to moderate to large hiatal hernia. Aortic atherosclerosis. Multiple treated compression deformities of the spine. Postsurgical changes of the left breast. Old appearing left-sided rib fractures. Acute appearing right fifth, sixth and possibly seventh lateral rib fractures. IMPRESSION: 1. Acute comminuted proximal right humerus fracture. 2. Acute appearing right fifth, sixth and possibly seventh lateral rib fractures. 3. Hiatal hernia. 4. No acute airspace disease. Electronically Signed   By: Jasmine Pang M.D.   On: 07/23/2022 23:50   DG Hip Unilat W or  Wo Pelvis 2-3 Views Right  Result Date: 07/23/2022 CLINICAL DATA:  Fall EXAM: DG HIP (WITH OR WITHOUT PELVIS) 2-3V RIGHT COMPARISON:  11/24/2021 FINDINGS: SI joints are non widened. Pubic symphysis appears intact. Previous intramedullary rodding of the left femur with similar appearance of hardware. Old appearing deformity of the left inferior  pubic ramus. Acute comminuted and displaced right intertrochanteric fracture. Right femoral head projects in joint. IMPRESSION: Acute comminuted and displaced right intertrochanteric fracture. Electronically Signed   By: Jasmine Pang M.D.   On: 07/23/2022 23:46   DG Tibia/Fibula Right  Result Date: 07/09/2022 CLINICAL DATA:  Right leg pain EXAM: RIGHT TIBIA AND FIBULA - 2 VIEW COMPARISON:  Right ankle radiographs 03/02/2009 FINDINGS: There is no acute fracture or dislocation. Bony alignment is normal. There is inferior calcaneal spurring. The soft tissues are unremarkable. IMPRESSION: No acute finding in the tibia/fibula. Electronically Signed   By: Lesia Hausen M.D.   On: 07/09/2022 13:00     Radiology No results found.  Procedures Procedures    Medications Ordered in ED Medications - No data to display  ED Course/ Medical Decision Making/ A&P                             Medical Decision Making Patient had a fall from standing with right hip and shoulder pain   Amount and/or Complexity of Data Reviewed Independent Historian: EMS    Details: See above  External Data Reviewed: notes.    Details: Previous notes reviewed  Labs: ordered.    Details: All labs reviewed:  normal white count 9.6, normal hemoglobin 13.3 platelet count 493, elevated.  Normal sodium 135, normal potassium 3.5, normal creatinine 1  Radiology: ordered and independent interpretation performed.    Details: Humerus fracture by me, intra-trochanteric hip fracture by me on XRAY, no ICH by me on CT head  Discussion of management or test interpretation with external provider(s): 1159 Case d/w Dr. Aundria Rud of orthopedics.  Patient information sent via secure chat, orthopedics to consult.  Please hold anticoagulation  Risk Prescription drug management. Decision regarding hospitalization.    Final Clinical Impression(s) / ED Diagnoses Final diagnoses:  None   The patient appears reasonably stabilized for  admission considering the current resources, flow, and capabilities available in the ED at this time, and I doubt any other [facility] requiring further screening and/or treatment in the ED prior to admission.     Jenean Escandon, MD 07/24/22 [id number]

## 2022-07-24 ENCOUNTER — Inpatient Hospital Stay (HOSPITAL_COMMUNITY): Payer: Medicare Other

## 2022-07-24 ENCOUNTER — Encounter (HOSPITAL_COMMUNITY): Payer: Self-pay | Admitting: Family Medicine

## 2022-07-24 DIAGNOSIS — I252 Old myocardial infarction: Secondary | ICD-10-CM | POA: Diagnosis not present

## 2022-07-24 DIAGNOSIS — Z86711 Personal history of pulmonary embolism: Secondary | ICD-10-CM | POA: Diagnosis not present

## 2022-07-24 DIAGNOSIS — F0394 Unspecified dementia, unspecified severity, with anxiety: Secondary | ICD-10-CM | POA: Diagnosis present

## 2022-07-24 DIAGNOSIS — E785 Hyperlipidemia, unspecified: Secondary | ICD-10-CM | POA: Diagnosis present

## 2022-07-24 DIAGNOSIS — K219 Gastro-esophageal reflux disease without esophagitis: Secondary | ICD-10-CM | POA: Diagnosis present

## 2022-07-24 DIAGNOSIS — Z8249 Family history of ischemic heart disease and other diseases of the circulatory system: Secondary | ICD-10-CM | POA: Diagnosis not present

## 2022-07-24 DIAGNOSIS — I110 Hypertensive heart disease with heart failure: Secondary | ICD-10-CM | POA: Diagnosis present

## 2022-07-24 DIAGNOSIS — S42309A Unspecified fracture of shaft of humerus, unspecified arm, initial encounter for closed fracture: Secondary | ICD-10-CM

## 2022-07-24 DIAGNOSIS — S2241XA Multiple fractures of ribs, right side, initial encounter for closed fracture: Secondary | ICD-10-CM | POA: Diagnosis present

## 2022-07-24 DIAGNOSIS — Z96652 Presence of left artificial knee joint: Secondary | ICD-10-CM | POA: Diagnosis present

## 2022-07-24 DIAGNOSIS — I2510 Atherosclerotic heart disease of native coronary artery without angina pectoris: Secondary | ICD-10-CM | POA: Diagnosis present

## 2022-07-24 DIAGNOSIS — W19XXXA Unspecified fall, initial encounter: Secondary | ICD-10-CM

## 2022-07-24 DIAGNOSIS — S72001A Fracture of unspecified part of neck of right femur, initial encounter for closed fracture: Secondary | ICD-10-CM | POA: Diagnosis not present

## 2022-07-24 DIAGNOSIS — I5032 Chronic diastolic (congestive) heart failure: Secondary | ICD-10-CM | POA: Diagnosis present

## 2022-07-24 DIAGNOSIS — F17210 Nicotine dependence, cigarettes, uncomplicated: Secondary | ICD-10-CM | POA: Diagnosis present

## 2022-07-24 DIAGNOSIS — S42211A Unspecified displaced fracture of surgical neck of right humerus, initial encounter for closed fracture: Secondary | ICD-10-CM | POA: Diagnosis present

## 2022-07-24 DIAGNOSIS — W010XXA Fall on same level from slipping, tripping and stumbling without subsequent striking against object, initial encounter: Secondary | ICD-10-CM | POA: Diagnosis present

## 2022-07-24 DIAGNOSIS — S42201A Unspecified fracture of upper end of right humerus, initial encounter for closed fracture: Secondary | ICD-10-CM

## 2022-07-24 DIAGNOSIS — Z955 Presence of coronary angioplasty implant and graft: Secondary | ICD-10-CM | POA: Diagnosis not present

## 2022-07-24 DIAGNOSIS — Z853 Personal history of malignant neoplasm of breast: Secondary | ICD-10-CM | POA: Diagnosis not present

## 2022-07-24 DIAGNOSIS — F32A Depression, unspecified: Secondary | ICD-10-CM | POA: Diagnosis present

## 2022-07-24 DIAGNOSIS — F0393 Unspecified dementia, unspecified severity, with mood disturbance: Secondary | ICD-10-CM | POA: Diagnosis present

## 2022-07-24 DIAGNOSIS — S72141A Displaced intertrochanteric fracture of right femur, initial encounter for closed fracture: Secondary | ICD-10-CM | POA: Diagnosis present

## 2022-07-24 DIAGNOSIS — Z86718 Personal history of other venous thrombosis and embolism: Secondary | ICD-10-CM | POA: Diagnosis not present

## 2022-07-24 DIAGNOSIS — G4733 Obstructive sleep apnea (adult) (pediatric): Secondary | ICD-10-CM | POA: Diagnosis present

## 2022-07-24 DIAGNOSIS — M810 Age-related osteoporosis without current pathological fracture: Secondary | ICD-10-CM | POA: Diagnosis present

## 2022-07-24 DIAGNOSIS — S22010A Wedge compression fracture of first thoracic vertebra, initial encounter for closed fracture: Secondary | ICD-10-CM

## 2022-07-24 DIAGNOSIS — Z880 Allergy status to penicillin: Secondary | ICD-10-CM | POA: Diagnosis not present

## 2022-07-24 DIAGNOSIS — D62 Acute posthemorrhagic anemia: Secondary | ICD-10-CM | POA: Diagnosis not present

## 2022-07-24 DIAGNOSIS — I480 Paroxysmal atrial fibrillation: Secondary | ICD-10-CM | POA: Diagnosis present

## 2022-07-24 LAB — CBC WITH DIFFERENTIAL/PLATELET
Abs Immature Granulocytes: 0.06 10*3/uL (ref 0.00–0.07)
Basophils Absolute: 0.1 10*3/uL (ref 0.0–0.1)
Basophils Relative: 0 %
Eosinophils Absolute: 0 10*3/uL (ref 0.0–0.5)
Eosinophils Relative: 0 %
HCT: 37.8 % (ref 36.0–46.0)
Hemoglobin: 12.1 g/dL (ref 12.0–15.0)
Immature Granulocytes: 0 %
Lymphocytes Relative: 3 %
Lymphs Abs: 0.4 10*3/uL — ABNORMAL LOW (ref 0.7–4.0)
MCH: 26.6 pg (ref 26.0–34.0)
MCHC: 32 g/dL (ref 30.0–36.0)
MCV: 83.1 fL (ref 80.0–100.0)
Monocytes Absolute: 1.1 10*3/uL — ABNORMAL HIGH (ref 0.1–1.0)
Monocytes Relative: 8 %
Neutro Abs: 11.9 10*3/uL — ABNORMAL HIGH (ref 1.7–7.7)
Neutrophils Relative %: 89 %
Platelets: 472 10*3/uL — ABNORMAL HIGH (ref 150–400)
RBC: 4.55 MIL/uL (ref 3.87–5.11)
RDW: 14.9 % (ref 11.5–15.5)
WBC: 13.5 10*3/uL — ABNORMAL HIGH (ref 4.0–10.5)
nRBC: 0 % (ref 0.0–0.2)

## 2022-07-24 LAB — BASIC METABOLIC PANEL
Anion gap: 11 (ref 5–15)
BUN: 12 mg/dL (ref 8–23)
CO2: 26 mmol/L (ref 22–32)
Calcium: 8.7 mg/dL — ABNORMAL LOW (ref 8.9–10.3)
Chloride: 99 mmol/L (ref 98–111)
Creatinine, Ser: 0.77 mg/dL (ref 0.44–1.00)
GFR, Estimated: >60 mL/min (ref >60)
Glucose, Bld: 160 mg/dL — ABNORMAL HIGH (ref 70–99)
Potassium: 3.4 mmol/L — ABNORMAL LOW (ref 3.5–5.1)
Sodium: 136 mmol/L (ref 135–145)

## 2022-07-24 LAB — GLUCOSE, CAPILLARY: Glucose-Capillary: 143 mg/dL — ABNORMAL HIGH (ref 70–99)

## 2022-07-24 LAB — MRSA NEXT GEN BY PCR, NASAL: MRSA by PCR Next Gen: NOT DETECTED

## 2022-07-24 MED ORDER — AMIODARONE HCL 200 MG PO TABS
200.0000 mg | ORAL_TABLET | Freq: Every day | ORAL | Status: DC
  Administered 2022-07-24 – 2022-07-28 (×5): 200 mg via ORAL
  Filled 2022-07-24 (×5): qty 1

## 2022-07-24 MED ORDER — SENNOSIDES-DOCUSATE SODIUM 8.6-50 MG PO TABS: 1.0000 | ORAL_TABLET | Freq: Every evening | ORAL | Status: DC | PRN

## 2022-07-24 MED ORDER — VANCOMYCIN HCL IN DEXTROSE 1-5 GM/200ML-% IV SOLN
1000.0000 mg | INTRAVENOUS | Status: AC
  Administered 2022-07-25: 1000 mg via INTRAVENOUS
  Filled 2022-07-24: qty 200

## 2022-07-24 MED ORDER — ACETAMINOPHEN 325 MG PO TABS
650.0000 mg | ORAL_TABLET | Freq: Four times a day (QID) | ORAL | Status: DC | PRN
  Administered 2022-07-25 – 2022-07-26 (×3): 650 mg via ORAL
  Filled 2022-07-24 (×3): qty 2

## 2022-07-24 MED ORDER — AMLODIPINE BESYLATE 5 MG PO TABS
5.0000 mg | ORAL_TABLET | Freq: Every day | ORAL | Status: DC
  Administered 2022-07-24 – 2022-07-28 (×5): 5 mg via ORAL
  Filled 2022-07-24 (×5): qty 1

## 2022-07-24 MED ORDER — ONDANSETRON HCL 4 MG/2ML IJ SOLN: 4.0000 mg | Freq: Four times a day (QID) | INTRAMUSCULAR | Status: DC | PRN

## 2022-07-24 MED ORDER — FENTANYL CITRATE PF 50 MCG/ML IJ SOSY
50.0000 ug | PREFILLED_SYRINGE | Freq: Once | INTRAMUSCULAR | Status: AC
  Administered 2022-07-24: 50 ug via INTRAVENOUS
  Filled 2022-07-24: qty 1

## 2022-07-24 MED ORDER — ONDANSETRON HCL 4 MG PO TABS: 4.0000 mg | ORAL_TABLET | Freq: Four times a day (QID) | ORAL | Status: DC | PRN

## 2022-07-24 MED ORDER — ORAL CARE MOUTH RINSE: 15.0000 mL | OROMUCOSAL | Status: DC | PRN

## 2022-07-24 MED ORDER — NITROGLYCERIN 0.4 MG SL SUBL: 0.4000 mg | SUBLINGUAL_TABLET | SUBLINGUAL | Status: DC | PRN

## 2022-07-24 MED ORDER — CHLORHEXIDINE GLUCONATE 4 % EX LIQD
60.0000 mL | Freq: Once | CUTANEOUS | Status: AC
  Administered 2022-07-24: 4 via TOPICAL
  Filled 2022-07-24: qty 60

## 2022-07-24 MED ORDER — POVIDONE-IODINE 10 % EX SWAB: 2.0000 | Freq: Once | CUTANEOUS | Status: DC

## 2022-07-24 MED ORDER — POTASSIUM CHLORIDE CRYS ER 20 MEQ PO TBCR
40.0000 meq | EXTENDED_RELEASE_TABLET | Freq: Every day | ORAL | Status: AC
  Administered 2022-07-24 – 2022-07-27 (×4): 40 meq via ORAL
  Filled 2022-07-24 (×4): qty 2

## 2022-07-24 MED ORDER — ACETAMINOPHEN 650 MG RE SUPP: 650.0000 mg | Freq: Four times a day (QID) | RECTAL | Status: DC | PRN

## 2022-07-24 MED ORDER — HYDROMORPHONE HCL 1 MG/ML IJ SOLN
1.0000 mg | INTRAMUSCULAR | Status: DC | PRN
  Administered 2022-07-24 – 2022-07-28 (×12): 1 mg via INTRAVENOUS
  Filled 2022-07-24 (×12): qty 1

## 2022-07-24 MED ORDER — METOPROLOL TARTRATE 50 MG PO TABS
50.0000 mg | ORAL_TABLET | Freq: Two times a day (BID) | ORAL | Status: DC
  Administered 2022-07-24 – 2022-07-28 (×9): 50 mg via ORAL
  Filled 2022-07-24 (×9): qty 1

## 2022-07-24 MED ORDER — ENOXAPARIN SODIUM 40 MG/0.4ML IJ SOSY
40.0000 mg | PREFILLED_SYRINGE | INTRAMUSCULAR | Status: DC
  Administered 2022-07-24 – 2022-07-25 (×2): 40 mg via SUBCUTANEOUS
  Filled 2022-07-24 (×2): qty 0.4

## 2022-07-24 NOTE — ED Notes (Signed)
Floor notified pt is being transported

## 2022-07-24 NOTE — ED Notes (Signed)
ED TO INPATIENT HANDOFF REPORT  ED Nurse Name and Phone #: [facility] [phone]  S Name/Age/Gender [name] 89 y.o. female Room/Bed: 034C/034C  Code Status   Code Status: Prior  Home/SNF/Other Skilled nursing facility Patient oriented to: self, place, time, and situation Is this baseline? Yes      Chief Complaint Humerus fracture [S42.309A]  Triage Note Pt BIBEMS s/p fall from standing position, c/o Rt neck, shoulder, hip pain. C-Collar in place on arrival. Pt  currently on Plavix   Allergies Allergies  Allergen Reactions   Penicillins Hives, Rash and Other (See Comments)    Has patient had a PCN reaction causing immediate rash, facial/tongue/throat swelling, SOB or lightheadedness with hypotension: Yes Has patient had a PCN reaction causing severe rash involving mucus membranes or skin necrosis: yes Has patient had a PCN reaction that required hospitalization: No Has patient had a PCN reaction occurring within the last 10 years: no If all of the above answers are "NO", then may proceed with Cephalosporin use.  Other Reaction: OTHER REACTION   Valsartan Itching and Other (See Comments)   Atorvastatin Diarrhea   Carvedilol Other (See Comments)    "teeth hurt when I took it"    Ezetimibe Nausea Only   Fluvastatin Sodium Other (See Comments)    "Can't remember"   Magnesium Hydroxide Other (See Comments)    REACTION: triggers HAs   Meloxicam Other (See Comments)    Pt seeing auro's / spots.     Pneumovax [Pneumococcal Polysaccharide Vaccine] Other (See Comments)    Local reaction   Quinapril Hcl Other (See Comments)     "feelings of tiredness"   Simvastatin Diarrhea   Topamax [Topiramate] Itching   Vit D-Vit E-Safflower Oil Other (See Comments)    Headaches    Level of Care/Admitting Diagnosis ED Disposition     ED Disposition  Admit   Condition  --   Comment  Hospital Area: [facility] [id number]  Level of Care: Med-Surg [16]  May  admit patient to [facility] or [facility] if equivalent level of care is available:: Yes  Covid Evaluation: Confirmed COVID Negative  Diagnosis: Humerus fracture [id number]  Admitting Physician: [place], [place]  Attending Physician: Quintella Baton [id number]  Certification:: I certify this patient will need inpatient services for at least 2 midnights  Estimated Length of Stay: 2          B Medical/Surgery History Past Medical History:  Diagnosis Date   CAD (coronary artery disease)    mild nonobstructive disease on cath in 2003   Cancer [facility])    Cardiomyopathy    Probable Takotsubo, severe CP w/ normal cath in 1994. Severe CP in 2003 w/ widespread T wave inversions on ECG. Cath w/ minimal coronary disease but LV-gram showed periapical severe hypokinesis and basilar hyperkinesia (EF 40%). Last echo in 4/09 confirmed full LV functional recovery with EF 60%, no regional wall motion abnormalities, mild to moderate LVH.   Chronic diastolic CHF (congestive heart failure) ([place]) 09/13/2019   CVA (cerebral infarction)    Depression with anxiety 03/29/2011   lost husband 3'09   DVT (deep venous thrombosis) ([place])    after venous ablation, R leg   Fracture 09/10/2007   L2, status post vertebroplasty of L2 performed by IR   GERD (gastroesophageal reflux disease)    Hemorrhoids    Hiatal hernia 03/29/2011   no nerve problems   Hyperlipidemia    Hyperplastic colon polyp 06/2001   Hypertension  Iron deficiency anemia due to chronic blood loss 12/30/2017   OA (osteoarthritis) of knee 03/29/2011   w/ bilateral knee pain-not a problem now   OSA (obstructive sleep apnea) 03/29/2011   no cpap used, not a problem now.   Osteoporosis    Otalgia of both ears    Dr. Simeon Craft   Polycythemia    Stroke [facility]) 03/29/2011   CVA x2 -last 10'12-?TIA(visual problems)   Varicose vein    Past Surgical History:  Procedure Laterality Date   APPENDECTOMY     BACK SURGERY     BREAST BIOPSY     BREAST  CYST EXCISION     BREAST LUMPECTOMY Left 2013   left stage I left breast cancer   CHOLECYSTECTOMY  04/09/2011   Procedure: LAPAROSCOPIC CHOLECYSTECTOMY WITH INTRAOPERATIVE CHOLANGIOGRAM;  Surgeon: Odis Hollingshead, MD;  Location: [facility];  Service: General;  Laterality: N/A;   CORONARY ATHERECTOMY N/A 02/26/2022   Procedure: CORONARY ATHERECTOMY;  Surgeon: Nelva Bush, MD;  Location: [place] [facility];  Service: Cardiovascular;  Laterality: N/A;   CORONARY LITHOTRIPSY N/A 02/26/2022   Procedure: CORONARY LITHOTRIPSY;  Surgeon: Nelva Bush, MD;  Location: [place] [facility];  Service: Cardiovascular;  Laterality: N/A;   CORONARY STENT INTERVENTION N/A 02/26/2022   Procedure: CORONARY STENT INTERVENTION;  Surgeon: Nelva Bush, MD;  Location: [place] [facility];  Service: Cardiovascular;  Laterality: N/A;   Dental Extraction     L maxillary molar   INTRAMEDULLARY (IM) NAIL INTERTROCHANTERIC Left 12/25/2019   Procedure: INTRAMEDULLARY (IM) NAIL INTERTROCHANTRIC;  Surgeon: Paralee Cancel, MD;  Location: [facility];  Service: Orthopedics;  Laterality: Left;   INTRAVASCULAR ULTRASOUND/IVUS N/A 02/26/2022   Procedure: Intravascular Ultrasound/IVUS;  Surgeon: Nelva Bush, MD;  Location: [place] [facility];  Service: Cardiovascular;  Laterality: N/A;   IR KYPHO THORACIC WITH BONE BIOPSY  09/15/2019   IR KYPHO THORACIC WITH BONE BIOPSY  07/13/2020       IR KYPHO THORACIC WITH BONE BIOPSY  07/13/2020   IR VERTEBROPLASTY CERV/THOR BX INC UNI/BIL INC/INJECT/IMAGING  11/10/2019   KYPHOPLASTY N/A 11/16/2020   Procedure: T11,  L1,KYPHOPLASTY;  Surgeon: Melina Schools, MD;  Location: [place];  Service: Orthopedics;  Laterality: N/A;   KYPHOSIS SURGERY  08/2007   cement used   LEFT HEART CATH AND CORONARY ANGIOGRAPHY N/A 02/23/2022   Procedure: LEFT HEART CATH AND CORONARY ANGIOGRAPHY;  Surgeon: Martinique, Peter M, MD;  Location: [place] [facility];  Service: Cardiovascular;  Laterality: N/A;   LEFT HEART  CATH AND CORONARY ANGIOGRAPHY N/A 02/26/2022   Procedure: LEFT HEART CATH AND CORONARY ANGIOGRAPHY;  Surgeon: Nelva Bush, MD;  Location: [place] [facility];  Service: Cardiovascular;  Laterality: N/A;   OVARIAN CYST SURGERY     left   RADIOLOGY WITH ANESTHESIA N/A 11/10/2019   Procedure: VERTEBROPLASTY;  Surgeon: Luanne Bras, MD;  Location: [place];  Service: Radiology;  Laterality: N/A;   TEMPORARY PACEMAKER N/A 02/26/2022   Procedure: TEMPORARY PACEMAKER;  Surgeon: Nelva Bush, MD;  Location: [place] [facility];  Service: Cardiovascular;  Laterality: N/A;   TONSILLECTOMY     TOTAL KNEE ARTHROPLASTY  06/2010   left   TUBAL LIGATION       A IV Location/Drains/Wounds Patient Lines/Drains/Airways Status     Active Line/Drains/Airways     Name Placement date Placement time Site Days   Peripheral IV 07/23/22 20 G Left Antecubital 07/23/22  2328  Antecubital  1   Peripheral IV 07/23/22 22 G Posterior;Left  Hand 07/23/22  2329  Hand  1   Urethral Catheter Pritesh Sobecki RN Latex 16 Fr. 07/24/22  0129  Latex  less than 1   External Urinary Catheter 07/09/22  1217  --  15            Intake/Output Last 24 hours No intake or output data in the 24 hours ending 07/24/22 0227  Labs/Imaging Results for orders placed or performed during the hospital encounter of 07/23/22 (from the past 48 hour(s))  CBC with Differential     Status: Abnormal   Collection Time: 07/23/22 11:09 PM  Result Value Ref Range   WBC 9.6 4.0 - 10.5 K/uL   RBC 4.97 3.87 - 5.11 MIL/uL   Hemoglobin 13.3 12.0 - 15.0 g/dL   HCT 42.0 36.0 - 46.0 %   MCV 84.5 80.0 - 100.0 fL   MCH 26.8 26.0 - 34.0 pg   MCHC 31.7 30.0 - 36.0 g/dL   RDW 14.9 11.5 - 15.5 %   Platelets 493 (H) 150 - 400 K/uL   nRBC 0.0 0.0 - 0.2 %   Neutrophils Relative % 82 %   Neutro Abs 7.8 (H) 1.7 - 7.7 K/uL   Lymphocytes Relative 8 %   Lymphs Abs 0.7 0.7 - 4.0 K/uL   Monocytes Relative 8 %   Monocytes Absolute 0.8 0.1 - 1.0 K/uL    Eosinophils Relative 1 %   Eosinophils Absolute 0.1 0.0 - 0.5 K/uL   Basophils Relative 1 %   Basophils Absolute 0.1 0.0 - 0.1 K/uL   Immature Granulocytes 0 %   Abs Immature Granulocytes 0.04 0.00 - 0.07 K/uL    Comment: Performed at [facility], [address] [place], [place] [address]  I-stat chem 8, ED (not at [facility], [facility] or [facility])     Status: Abnormal   Collection Time: 07/23/22 11:44 PM  Result Value Ref Range   Sodium 135 135 - 145 mmol/L   Potassium 3.5 3.5 - 5.1 mmol/L   Chloride 99 98 - 111 mmol/L   BUN 15 8 - 23 mg/dL   Creatinine, Ser 1.00 0.44 - 1.00 mg/dL   Glucose, Bld 142 (H) 70 - 99 mg/dL    Comment: Glucose reference range applies only to samples taken after fasting for at least 8 hours.   Calcium, Ion 1.00 (L) 1.15 - 1.40 mmol/L   TCO2 29 22 - 32 mmol/L   Hemoglobin 14.6 12.0 - 15.0 g/dL   HCT 43.0 36.0 - 46.0 %   *Note: Due to a large number of results and/or encounters for the requested time period, some results have not been displayed. A complete set of results can be found in Results Review.   CT Head Wo Contrast  Result Date: 07/24/2022 CLINICAL DATA:  Polytrauma, blunt.  Fall from standing position. EXAM: CT HEAD WITHOUT CONTRAST CT CERVICAL SPINE WITHOUT CONTRAST TECHNIQUE: Multidetector CT imaging of the head and cervical spine was performed following the standard protocol without intravenous contrast. Multiplanar CT image reconstructions of the cervical spine were also generated. RADIATION DOSE REDUCTION: This exam was performed according to the departmental dose-optimization program which includes automated exposure control, adjustment of the mA and/or kV according to patient size and/or use of iterative reconstruction technique. COMPARISON:  11/24/2021. FINDINGS: CT HEAD FINDINGS Brain: No acute intracranial hemorrhage, midline shift or mass effect. No extra-axial fluid collection. Extensive subcortical and periventricular white matter hypodensities are  present bilaterally. There is encephalomalacia in the frontal lobe on  the right. An old infarct is noted in the thalamus on the right. There is encephalomalacia in the parietal lobe on the left which is new from the prior exam. There is an old lacunar infarct in the left cerebellar hemisphere. Vascular: No hyperdense vessel or unexpected calcification. Skull: Normal. Negative for fracture or focal lesion. Sinuses/Orbits: No acute finding. Other: None. CT CERVICAL SPINE FINDINGS Alignment: Normal. Skull base and vertebrae: No acute fracture in the cervical spine. There is an old wedge shaped compression deformity in the superior endplate at T1. Soft tissues and spinal canal: No prevertebral fluid or swelling. No visible canal hematoma. Disc levels: Multilevel intervertebral disc space narrowing, uncovertebral osteophyte formation, and facet arthropathy. No significant spinal canal stenosis. Upper chest: Scarring is noted at the lung apices. Other: Carotid artery calcifications are noted. IMPRESSION: 1. No acute intracranial hemorrhage. 2. Chronic microvascular ischemic changes and old infarcts. Encephalomalacia is noted in the parietal lobe on the left which is new from the previous exam, although likely nonacute. If there is concern for acute infarct, MRI is suggested for further evaluation. 3. Multilevel degenerative changes in the cervical spine without evidence of acute fracture. 4. Chronic compression deformity in the superior endplate at T1. Electronically Signed   By: Brett Fairy M.D.   On: 07/24/2022 00:29   CT Cervical Spine Wo Contrast  Result Date: 07/24/2022 CLINICAL DATA:  Polytrauma, blunt.  Fall from standing position. EXAM: CT HEAD WITHOUT CONTRAST CT CERVICAL SPINE WITHOUT CONTRAST TECHNIQUE: Multidetector CT imaging of the head and cervical spine was performed following the standard protocol without intravenous contrast. Multiplanar CT image reconstructions of the cervical spine were also  generated. RADIATION DOSE REDUCTION: This exam was performed according to the departmental dose-optimization program which includes automated exposure control, adjustment of the mA and/or kV according to patient size and/or use of iterative reconstruction technique. COMPARISON:  11/24/2021. FINDINGS: CT HEAD FINDINGS Brain: No acute intracranial hemorrhage, midline shift or mass effect. No extra-axial fluid collection. Extensive subcortical and periventricular white matter hypodensities are present bilaterally. There is encephalomalacia in the frontal lobe on the right. An old infarct is noted in the thalamus on the right. There is encephalomalacia in the parietal lobe on the left which is new from the prior exam. There is an old lacunar infarct in the left cerebellar hemisphere. Vascular: No hyperdense vessel or unexpected calcification. Skull: Normal. Negative for fracture or focal lesion. Sinuses/Orbits: No acute finding. Other: None. CT CERVICAL SPINE FINDINGS Alignment: Normal. Skull base and vertebrae: No acute fracture in the cervical spine. There is an old wedge shaped compression deformity in the superior endplate at T1. Soft tissues and spinal canal: No prevertebral fluid or swelling. No visible canal hematoma. Disc levels: Multilevel intervertebral disc space narrowing, uncovertebral osteophyte formation, and facet arthropathy. No significant spinal canal stenosis. Upper chest: Scarring is noted at the lung apices. Other: Carotid artery calcifications are noted. IMPRESSION: 1. No acute intracranial hemorrhage. 2. Chronic microvascular ischemic changes and old infarcts. Encephalomalacia is noted in the parietal lobe on the left which is new from the previous exam, although likely nonacute. If there is concern for acute infarct, MRI is suggested for further evaluation. 3. Multilevel degenerative changes in the cervical spine without evidence of acute fracture. 4. Chronic compression deformity in the  superior endplate at T1. Electronically Signed   By: Brett Fairy M.D.   On: 07/24/2022 00:29   DG Shoulder Right  Result Date: 07/23/2022 CLINICAL DATA:  Fall EXAM: RIGHT SHOULDER -  2+ VIEW COMPARISON:  None Available. FINDINGS: AC joint appears intact with moderate degenerative change. Acute comminuted proximal humerus fracture involves the humeral neck and greater tuberosity. The humeral head appears to project over glenoid. About 1/2 shaft diameter anterior displacement of distal fracture fragment IMPRESSION: Acute comminuted and displaced proximal humerus fracture involving the humeral neck and greater tuberosity. Electronically Signed   By: Donavan Foil M.D.   On: 07/23/2022 23:52   DG Chest Portable 1 View  Result Date: 07/23/2022 CLINICAL DATA:  Fall EXAM: PORTABLE CHEST 1 VIEW COMPARISON:  02/06/2022, CT 10/01/2021 FINDINGS: Acute comminuted proximal right humerus fracture. No acute airspace disease or pleural effusion. Retrocardiac opacity corresponding to moderate to large hiatal hernia. Aortic atherosclerosis. Multiple treated compression deformities of the spine. Postsurgical changes of the left breast. Old appearing left-sided rib fractures. Acute appearing right fifth, sixth and possibly seventh lateral rib fractures. IMPRESSION: 1. Acute comminuted proximal right humerus fracture. 2. Acute appearing right fifth, sixth and possibly seventh lateral rib fractures. 3. Hiatal hernia. 4. No acute airspace disease. Electronically Signed   By: Donavan Foil M.D.   On: 07/23/2022 23:50   DG Hip Unilat W or Wo Pelvis 2-3 Views Right  Result Date: 07/23/2022 CLINICAL DATA:  Fall EXAM: DG HIP (WITH OR WITHOUT PELVIS) 2-3V RIGHT COMPARISON:  11/24/2021 FINDINGS: SI joints are non widened. Pubic symphysis appears intact. Previous intramedullary rodding of the left femur with similar appearance of hardware. Old appearing deformity of the left inferior pubic ramus. Acute comminuted and displaced right  intertrochanteric fracture. Right femoral head projects in joint. IMPRESSION: Acute comminuted and displaced right intertrochanteric fracture. Electronically Signed   By: Donavan Foil M.D.   On: 07/23/2022 23:46    Pending Labs Unresulted Labs (From admission, onward)    None       Vitals/Pain Today's Vitals   07/23/22 2327 07/24/22 0000 07/24/22 0015 07/24/22 0215  BP:  (!) 141/69 (!) 146/69 111/74  Pulse:  60 62 60  Resp:  '17 16 17  '$ Temp:      SpO2:  97% 91% 92%  Weight: 56.2 kg     Height: '5\' 6"'$  (1.676 m)     PainSc:        Isolation Precautions No active isolations  Medications Medications  HYDROmorphone (DILAUDID) injection 1 mg (has no administration in time range)  amiodarone (PACERONE) tablet 200 mg (has no administration in time range)  amLODipine (NORVASC) tablet 5 mg (has no administration in time range)  metoprolol tartrate (LOPRESSOR) tablet 50 mg (has no administration in time range)  nitroGLYCERIN (NITROSTAT) SL tablet 0.4 mg (has no administration in time range)  fentaNYL (SUBLIMAZE) injection 50 mcg (50 mcg Intravenous Given 07/24/22 0008)    Mobility non-ambulatory     Focused Assessments A&O x4, able to make needs known, skin intact   R Recommendations: See Admitting Provider Note  Report given to:   Additional Notes:

## 2022-07-24 NOTE — H&P (Addendum)
PCP:   Colon Branch, MD   Chief Complaint:  Fall  HPI: This is a pleasant 89 year old female with past medical history of CAD, Takotsubo cardiomyopathy, PAF, CVA, HTN, HLD, DVT/PE, HFpEF, OSA not on CPAP and falls.  She was recently admitted 02/23/22-02/28/2022, sp STEMI.  She had a LHC, successful PCI to ostial/proximal RCA.  She was discharged to [facility] assisted living center.  Today, while on the way to the kitchen to get a snack, she started balance and fell.  She hit her right shoulder, which looked on the door and she went down.  She was trapped between the door and the hallway.  She was in immediate pain, received immediate assistance, but they had difficulty getting out of the doorway.  In the ER imaging acute comminuted and displaced proximal humerus fracture involving the humeral neck and greater tuberosity and acute comminuted and displaced right intertrochanteric fracture.  She also has  right fifth, sixth and possibly seventh lateral rib fractures. Of note, patient maintained on both eliquis and plavix.    Review of Systems:  The patient denies anorexia, fever, weight loss,, vision loss, decreased hearing, hoarseness, chest pain, syncope, dyspnea on exertion, peripheral edema, balance deficits, hemoptysis, abdominal pain, melena, hematochezia, severe indigestion/heartburn, hematuria, incontinence, genital sores, muscle weakness, suspicious skin lesions, transient blindness, depression, unusual weight change, abnormal bleeding, enlarged lymph nodes, angioedema, and breast masses. Positives: Recurrent falls, right shoulder, leg, rib pain, inability to walk  Past Medical History: Past Medical History:  Diagnosis Date   CAD (coronary artery disease)    mild nonobstructive disease on cath in 2003   Cancer [facility])    Cardiomyopathy    Probable Takotsubo, severe CP w/ normal cath in 1994. Severe CP in 2003 w/ widespread T wave inversions on ECG. Cath w/ minimal coronary disease but  LV-gram showed periapical severe hypokinesis and basilar hyperkinesia (EF 40%). Last echo in 4/09 confirmed full LV functional recovery with EF 60%, no regional wall motion abnormalities, mild to moderate LVH.   Chronic diastolic CHF (congestive heart failure) ([place]) 09/13/2019   CVA (cerebral infarction)    Depression with anxiety 03/29/2011   lost husband 3'09   DVT (deep venous thrombosis) ([place])    after venous ablation, R leg   Fracture 09/10/2007   L2, status post vertebroplasty of L2 performed by IR   GERD (gastroesophageal reflux disease)    Hemorrhoids    Hiatal hernia 03/29/2011   no nerve problems   Hyperlipidemia    Hyperplastic colon polyp 06/2001   Hypertension    Iron deficiency anemia due to chronic blood loss 12/30/2017   OA (osteoarthritis) of knee 03/29/2011   w/ bilateral knee pain-not a problem now   OSA (obstructive sleep apnea) 03/29/2011   no cpap used, not a problem now.   Osteoporosis    Otalgia of both ears    Dr. Simeon Craft   Polycythemia    Stroke [facility]) 03/29/2011   CVA x2 -last 10'12-?TIA(visual problems)   Varicose vein    Past Surgical History:  Procedure Laterality Date   APPENDECTOMY     BACK SURGERY     BREAST BIOPSY     BREAST CYST EXCISION     BREAST LUMPECTOMY Left 2013   left stage I left breast cancer   CHOLECYSTECTOMY  04/09/2011   Procedure: LAPAROSCOPIC CHOLECYSTECTOMY WITH INTRAOPERATIVE CHOLANGIOGRAM;  Surgeon: Odis Hollingshead, MD;  Location: [facility];  Service: General;  Laterality: N/A;   CORONARY ATHERECTOMY N/A 02/26/2022  Procedure: CORONARY ATHERECTOMY;  Surgeon: Nelva Bush, MD;  Location: [place] CV LAB;  Service: Cardiovascular;  Laterality: N/A;   CORONARY LITHOTRIPSY N/A 02/26/2022   Procedure: CORONARY LITHOTRIPSY;  Surgeon: Nelva Bush, MD;  Location: [place] CV LAB;  Service: Cardiovascular;  Laterality: N/A;   CORONARY STENT INTERVENTION N/A 02/26/2022   Procedure: CORONARY STENT INTERVENTION;  Surgeon:  Nelva Bush, MD;  Location: [place] CV LAB;  Service: Cardiovascular;  Laterality: N/A;   Dental Extraction     L maxillary molar   INTRAMEDULLARY (IM) NAIL INTERTROCHANTERIC Left 12/25/2019   Procedure: INTRAMEDULLARY (IM) NAIL INTERTROCHANTRIC;  Surgeon: Paralee Cancel, MD;  Location: [facility];  Service: Orthopedics;  Laterality: Left;   INTRAVASCULAR ULTRASOUND/IVUS N/A 02/26/2022   Procedure: Intravascular Ultrasound/IVUS;  Surgeon: Nelva Bush, MD;  Location: [place] CV LAB;  Service: Cardiovascular;  Laterality: N/A;   IR KYPHO THORACIC WITH BONE BIOPSY  09/15/2019   IR KYPHO THORACIC WITH BONE BIOPSY  07/13/2020       IR KYPHO THORACIC WITH BONE BIOPSY  07/13/2020   IR VERTEBROPLASTY CERV/THOR BX INC UNI/BIL INC/INJECT/IMAGING  11/10/2019   KYPHOPLASTY N/A 11/16/2020   Procedure: T11,  L1,KYPHOPLASTY;  Surgeon: Melina Schools, MD;  Location: [place];  Service: Orthopedics;  Laterality: N/A;   KYPHOSIS SURGERY  08/2007   cement used   LEFT HEART CATH AND CORONARY ANGIOGRAPHY N/A 02/23/2022   Procedure: LEFT HEART CATH AND CORONARY ANGIOGRAPHY;  Surgeon: Martinique, Peter M, MD;  Location: [place] [facility] LAB;  Service: Cardiovascular;  Laterality: N/A;   LEFT HEART CATH AND CORONARY ANGIOGRAPHY N/A 02/26/2022   Procedure: LEFT HEART CATH AND CORONARY ANGIOGRAPHY;  Surgeon: Nelva Bush, MD;  Location: [place] CV LAB;  Service: Cardiovascular;  Laterality: N/A;   OVARIAN CYST SURGERY     left   RADIOLOGY WITH ANESTHESIA N/A 11/10/2019   Procedure: VERTEBROPLASTY;  Surgeon: Luanne Bras, MD;  Location: [place];  Service: Radiology;  Laterality: N/A;   TEMPORARY PACEMAKER N/A 02/26/2022   Procedure: TEMPORARY PACEMAKER;  Surgeon: Nelva Bush, MD;  Location: [place] [facility] LAB;  Service: Cardiovascular;  Laterality: N/A;   TONSILLECTOMY     TOTAL KNEE ARTHROPLASTY  06/2010   left   TUBAL LIGATION      Medications: Prior to Admission medications   Medication Sig Start  Date End Date Taking? Authorizing Provider  acetaminophen (TYLENOL) 325 MG tablet Take 650 mg by mouth every 6 (six) hours as needed for mild pain.    [provider]  alendronate (FOSAMAX) 70 MG tablet Take 1 tablet (70 mg total) by mouth once a week. Take with a full glass of water on an empty stomach. 12/19/21   Colon Branch, MD  amiodarone (PACERONE) 200 MG tablet Take 200 mg by mouth daily. 06/18/22   [provider]  amLODipine (NORVASC) 5 MG tablet Take 1 tablet (5 mg total) by mouth daily. 12/19/21   Colon Branch, MD  apixaban (ELIQUIS) 2.5 MG TABS tablet Take 1 tablet (2.5 mg total) by mouth 2 (two) times daily. 02/28/22   Cheryln Manly, NP  clopidogrel (PLAVIX) 75 MG tablet Take 1 tablet (75 mg total) by mouth daily. 03/01/22   Cheryln Manly, NP  furosemide (LASIX) 20 MG tablet Take 1 tablet (20 mg total) by mouth daily as needed for fluid or edema (for weigh gain of 3 pounds or more over one day). 02/28/22   Cheryln Manly, NP  LORazepam (ATIVAN) 0.5 MG  tablet Take 1 tablet (0.5 mg total) by mouth 2 (two) times daily as needed for anxiety. 02/01/22   Colon Branch, MD  metoprolol tartrate (LOPRESSOR) 50 MG tablet Take 1 tablet (50 mg total) by mouth 2 (two) times daily. 12/19/21   Colon Branch, MD  nitroGLYCERIN (NITROSTAT) 0.4 MG SL tablet Place 1 tablet (0.4 mg total) under the tongue every 5 (five) minutes x 3 doses as needed for chest pain. 02/28/22   Cheryln Manly, NP  ondansetron (ZOFRAN) 4 MG tablet Take 1 tablet (4 mg total) by mouth every 8 (eight) hours as needed for nausea or vomiting. 02/14/22   Colon Branch, MD  pantoprazole (PROTONIX) 40 MG tablet Take 1 tablet (40 mg total) by mouth at bedtime. 02/28/22   Cheryln Manly, NP  rosuvastatin (CRESTOR) 20 MG tablet Take 1 tablet (20 mg total) by mouth daily. 03/01/22   Cheryln Manly, NP  sertraline (ZOLOFT) 25 MG tablet Take 25 mg by mouth daily. 06/18/22   [provider]    Allergies:    Allergies  Allergen Reactions   Penicillins Hives, Rash and Other (See Comments)    Has patient had a PCN reaction causing immediate rash, facial/tongue/throat swelling, SOB or lightheadedness with hypotension: Yes Has patient had a PCN reaction causing severe rash involving mucus membranes or skin necrosis: yes Has patient had a PCN reaction that required hospitalization: No Has patient had a PCN reaction occurring within the last 10 years: no If all of the above answers are "NO", then may proceed with Cephalosporin use.  Other Reaction: OTHER REACTION   Valsartan Itching and Other (See Comments)   Atorvastatin Diarrhea   Carvedilol Other (See Comments)    "teeth hurt when I took it"    Ezetimibe Nausea Only   Fluvastatin Sodium Other (See Comments)    "Can't remember"   Magnesium Hydroxide Other (See Comments)    REACTION: triggers HAs   Meloxicam Other (See Comments)    Pt seeing auro's / spots.     Pneumovax [Pneumococcal Polysaccharide Vaccine] Other (See Comments)    Local reaction   Quinapril Hcl Other (See Comments)     "feelings of tiredness"   Simvastatin Diarrhea   Topamax [Topiramate] Itching   Vit D-Vit E-Safflower Oil Other (See Comments)    Headaches    Social History:  reports that she has been smoking cigarettes. She started smoking about 71 years ago. She has a 37.00 pack-year smoking history. She has never used smokeless tobacco. She reports that she does not currently use alcohol after a past usage of about 1.0 standard drink of alcohol per week. She reports that she does not use drugs.  Family History: Family History  Problem Relation Age of Onset   Breast cancer Mother    Heart disease Father        MIs   Colon cancer Other        cousin   Breast cancer Sister    Leukemia Brother        GM    Physical Exam: Vitals:   07/23/22 2309 07/23/22 2327 07/24/22 0000 07/24/22 0015  BP: (!) 147/70  (!) 141/69 (!) 146/69  Pulse: (!) 58  60 62  Resp:  '18  17 16  '$ Temp: 97.9 F (36.6 C)     SpO2: 96%  97% 91%  Weight:  56.2 kg    Height:  '5\' 6"'$  (1.676 m)      General:  Alert and oriented times three, well developed and nourished, in acute distress secondary to pain Eyes: PERRLA, pink conjunctiva, no scleral icterus ENT: Dry oral mucosa, neck supple, no thyromegaly, ++JVD R>L Lungs: clear to ascultation, no wheeze, no crackles, no use of accessory muscles Cardiovascular: regular rate and rhythm, no regurgitation, no gallops, no murmurs. No carotid bruits, no JVD Abdomen: soft, positive BS, non-tender, non-distended, no organomegaly, not an acute abdomen GU: not examined Neuro: CN II - XII grossly intact, sensation intact Musculoskeletal: Right shoulder swollen and upriding. RUE unable to move d/t severity of discomfort.  RLE pain with  slight movement  Skin: no rash, no subcutaneous crepitation, no decubitus Psych: appropriate patient   Labs on Admission:  Recent Labs    07/23/22 2344  NA 135  K 3.5  CL 99  GLUCOSE 142*  BUN 15  CREATININE 1.00    Radiological Exams on Admission: CT Head Wo Contrast  Result Date: 07/24/2022 CLINICAL DATA:  Polytrauma, blunt.  Fall from standing position. EXAM: CT HEAD WITHOUT CONTRAST CT CERVICAL SPINE WITHOUT CONTRAST TECHNIQUE: Multidetector CT imaging of the head and cervical spine was performed following the standard protocol without intravenous contrast. Multiplanar CT image reconstructions of the cervical spine were also generated. RADIATION DOSE REDUCTION: This exam was performed according to the departmental dose-optimization program which includes automated exposure control, adjustment of the mA and/or kV according to patient size and/or use of iterative reconstruction technique. COMPARISON:  11/24/2021. FINDINGS: CT HEAD FINDINGS Brain: No acute intracranial hemorrhage, midline shift or mass effect. No extra-axial fluid collection. Extensive subcortical and periventricular white matter  hypodensities are present bilaterally. There is encephalomalacia in the frontal lobe on the right. An old infarct is noted in the thalamus on the right. There is encephalomalacia in the parietal lobe on the left which is new from the prior exam. There is an old lacunar infarct in the left cerebellar hemisphere. Vascular: No hyperdense vessel or unexpected calcification. Skull: Normal. Negative for fracture or focal lesion. Sinuses/Orbits: No acute finding. Other: None. CT CERVICAL SPINE FINDINGS Alignment: Normal. Skull base and vertebrae: No acute fracture in the cervical spine. There is an old wedge shaped compression deformity in the superior endplate at T1. Soft tissues and spinal canal: No prevertebral fluid or swelling. No visible canal hematoma. Disc levels: Multilevel intervertebral disc space narrowing, uncovertebral osteophyte formation, and facet arthropathy. No significant spinal canal stenosis. Upper chest: Scarring is noted at the lung apices. Other: Carotid artery calcifications are noted. IMPRESSION: 1. No acute intracranial hemorrhage. 2. Chronic microvascular ischemic changes and old infarcts. Encephalomalacia is noted in the parietal lobe on the left which is new from the previous exam, although likely nonacute. If there is concern for acute infarct, MRI is suggested for further evaluation. 3. Multilevel degenerative changes in the cervical spine without evidence of acute fracture. 4. Chronic compression deformity in the superior endplate at T1. Electronically Signed   By: Brett Fairy M.D.   On: 07/24/2022 00:29   CT Cervical Spine Wo Contrast  Result Date: 07/24/2022 CLINICAL DATA:  Polytrauma, blunt.  Fall from standing position. EXAM: CT HEAD WITHOUT CONTRAST CT CERVICAL SPINE WITHOUT CONTRAST TECHNIQUE: Multidetector CT imaging of the head and cervical spine was performed following the standard protocol without intravenous contrast. Multiplanar CT image reconstructions of the cervical  spine were also generated. RADIATION DOSE REDUCTION: This exam was performed according to the departmental dose-optimization program which includes automated exposure control, adjustment of the mA and/or kV according to  patient size and/or use of iterative reconstruction technique. COMPARISON:  11/24/2021. FINDINGS: CT HEAD FINDINGS Brain: No acute intracranial hemorrhage, midline shift or mass effect. No extra-axial fluid collection. Extensive subcortical and periventricular white matter hypodensities are present bilaterally. There is encephalomalacia in the frontal lobe on the right. An old infarct is noted in the thalamus on the right. There is encephalomalacia in the parietal lobe on the left which is new from the prior exam. There is an old lacunar infarct in the left cerebellar hemisphere. Vascular: No hyperdense vessel or unexpected calcification. Skull: Normal. Negative for fracture or focal lesion. Sinuses/Orbits: No acute finding. Other: None. CT CERVICAL SPINE FINDINGS Alignment: Normal. Skull base and vertebrae: No acute fracture in the cervical spine. There is an old wedge shaped compression deformity in the superior endplate at T1. Soft tissues and spinal canal: No prevertebral fluid or swelling. No visible canal hematoma. Disc levels: Multilevel intervertebral disc space narrowing, uncovertebral osteophyte formation, and facet arthropathy. No significant spinal canal stenosis. Upper chest: Scarring is noted at the lung apices. Other: Carotid artery calcifications are noted. IMPRESSION: 1. No acute intracranial hemorrhage. 2. Chronic microvascular ischemic changes and old infarcts. Encephalomalacia is noted in the parietal lobe on the left which is new from the previous exam, although likely nonacute. If there is concern for acute infarct, MRI is suggested for further evaluation. 3. Multilevel degenerative changes in the cervical spine without evidence of acute fracture. 4. Chronic compression  deformity in the superior endplate at T1. Electronically Signed   By: Brett Fairy M.D.   On: 07/24/2022 00:29   DG Shoulder Right  Result Date: 07/23/2022 CLINICAL DATA:  Fall EXAM: RIGHT SHOULDER - 2+ VIEW COMPARISON:  None Available. FINDINGS: AC joint appears intact with moderate degenerative change. Acute comminuted proximal humerus fracture involves the humeral neck and greater tuberosity. The humeral head appears to project over glenoid. About 1/2 shaft diameter anterior displacement of distal fracture fragment IMPRESSION: Acute comminuted and displaced proximal humerus fracture involving the humeral neck and greater tuberosity. Electronically Signed   By: Donavan Foil M.D.   On: 07/23/2022 23:52   DG Chest Portable 1 View  Result Date: 07/23/2022 CLINICAL DATA:  Fall EXAM: PORTABLE CHEST 1 VIEW COMPARISON:  02/06/2022, CT 10/01/2021 FINDINGS: Acute comminuted proximal right humerus fracture. No acute airspace disease or pleural effusion. Retrocardiac opacity corresponding to moderate to large hiatal hernia. Aortic atherosclerosis. Multiple treated compression deformities of the spine. Postsurgical changes of the left breast. Old appearing left-sided rib fractures. Acute appearing right fifth, sixth and possibly seventh lateral rib fractures. IMPRESSION: 1. Acute comminuted proximal right humerus fracture. 2. Acute appearing right fifth, sixth and possibly seventh lateral rib fractures. 3. Hiatal hernia. 4. No acute airspace disease. Electronically Signed   By: Donavan Foil M.D.   On: 07/23/2022 23:50   DG Hip Unilat W or Wo Pelvis 2-3 Views Right  Result Date: 07/23/2022 CLINICAL DATA:  Fall EXAM: DG HIP (WITH OR WITHOUT PELVIS) 2-3V RIGHT COMPARISON:  11/24/2021 FINDINGS: SI joints are non widened. Pubic symphysis appears intact. Previous intramedullary rodding of the left femur with similar appearance of hardware. Old appearing deformity of the left inferior pubic ramus. Acute comminuted and  displaced right intertrochanteric fracture. Right femoral head projects in joint. IMPRESSION: Acute comminuted and displaced right intertrochanteric fracture. Electronically Signed   By: Donavan Foil M.D.   On: 07/23/2022 23:46    Assessment/Plan Present on Admission: Acute comminuted/displaced proximal humerus fracture Acute comminuted and displaced right  intertrochanteric fracture.   Acute fracture right 5th, 6th and possibly 7th lateral ribs -Plavix and Eliquis on hold.  Ortho aware, surgery likely Friday -Pain meds as needed -IV fluid hydration -Orthopedics consult placed by EDP, aware  Fall/mechanical instability -PT/OT consult when cleared per Ortho/AM team  History of PE/DVT -On Eliquis.  Held held.  CAD sp stent 10/23/paroxysmal atrial fibrillation H/o Takotsubo cardiomyopathy -On Plavix for recent stent placement approximately 5 months.  Plavix on hold.  Will place cardiology consult -Continue amiodarone, Crestor and metoprolol.   Anxiety and depression -Continue Zoloft  Hyperlipidemia -Crestor resumed  Osteoporosis-Fosamax on hold H/o Breast cancer (HCC) Chronic compression deformity in the superior endplate at T1  -  [name] 07/24/2022, 1:39 AM

## 2022-07-24 NOTE — Consult Note (Signed)
Reason for Consult:Right humerus/hip fxs Referring Physician: Barb Merino Time called: [id number] Time at bedside: [name] is an 89 y.o. female.  HPI: [name] fell at home where she lives at [place]. She had immediate right shoulder and hip pain and could not get up. She was brought to the ED where x-rays showed right humeral head and right hip fxs and orthopedic surgery was consulted. She c/o localized pain to those areas.  Past Medical History:  Diagnosis Date   CAD (coronary artery disease)    mild nonobstructive disease on cath in 2003   Cancer [facility])    Cardiomyopathy    Probable Takotsubo, severe CP w/ normal cath in 1994. Severe CP in 2003 w/ widespread T wave inversions on ECG. Cath w/ minimal coronary disease but LV-gram showed periapical severe hypokinesis and basilar hyperkinesia (EF 40%). Last echo in 4/09 confirmed full LV functional recovery with EF 60%, no regional wall motion abnormalities, mild to moderate LVH.   Chronic diastolic CHF (congestive heart failure) ([place]) 09/13/2019   CVA (cerebral infarction)    Depression with anxiety 03/29/2011   lost husband 3'09   DVT (deep venous thrombosis) ([place])    after venous ablation, R leg   Fracture 09/10/2007   L2, status post vertebroplasty of L2 performed by IR   GERD (gastroesophageal reflux disease)    Hemorrhoids    Hiatal hernia 03/29/2011   no nerve problems   Hyperlipidemia    Hyperplastic colon polyp 06/2001   Hypertension    Iron deficiency anemia due to chronic blood loss 12/30/2017   OA (osteoarthritis) of knee 03/29/2011   w/ bilateral knee pain-not a problem now   OSA (obstructive sleep apnea) 03/29/2011   no cpap used, not a problem now.   Osteoporosis    Otalgia of both ears    Dr. Simeon Craft   Polycythemia    Stroke [facility]) 03/29/2011   CVA x2 -last 10'12-?TIA(visual problems)   Varicose vein     Past Surgical History:  Procedure Laterality Date   APPENDECTOMY     BACK SURGERY     BREAST  BIOPSY     BREAST CYST EXCISION     BREAST LUMPECTOMY Left 2013   left stage I left breast cancer   CHOLECYSTECTOMY  04/09/2011   Procedure: LAPAROSCOPIC CHOLECYSTECTOMY WITH INTRAOPERATIVE CHOLANGIOGRAM;  Surgeon: Odis Hollingshead, MD;  Location: [facility];  Service: General;  Laterality: N/A;   CORONARY ATHERECTOMY N/A 02/26/2022   Procedure: CORONARY ATHERECTOMY;  Surgeon: Nelva Bush, MD;  Location: [place] CV LAB;  Service: Cardiovascular;  Laterality: N/A;   CORONARY LITHOTRIPSY N/A 02/26/2022   Procedure: CORONARY LITHOTRIPSY;  Surgeon: Nelva Bush, MD;  Location: [place] CV LAB;  Service: Cardiovascular;  Laterality: N/A;   CORONARY STENT INTERVENTION N/A 02/26/2022   Procedure: CORONARY STENT INTERVENTION;  Surgeon: Nelva Bush, MD;  Location: [place] CV LAB;  Service: Cardiovascular;  Laterality: N/A;   Dental Extraction     L maxillary molar   INTRAMEDULLARY (IM) NAIL INTERTROCHANTERIC Left 12/25/2019   Procedure: INTRAMEDULLARY (IM) NAIL INTERTROCHANTRIC;  Surgeon: Paralee Cancel, MD;  Location: [facility];  Service: Orthopedics;  Laterality: Left;   INTRAVASCULAR ULTRASOUND/IVUS N/A 02/26/2022   Procedure: Intravascular Ultrasound/IVUS;  Surgeon: Nelva Bush, MD;  Location: [place] CV LAB;  Service: Cardiovascular;  Laterality: N/A;   IR KYPHO THORACIC WITH BONE BIOPSY  09/15/2019   IR KYPHO THORACIC WITH BONE BIOPSY  07/13/2020       IR  KYPHO THORACIC WITH BONE BIOPSY  07/13/2020   IR VERTEBROPLASTY CERV/THOR BX INC UNI/BIL INC/INJECT/IMAGING  11/10/2019   KYPHOPLASTY N/A 11/16/2020   Procedure: T11,  L1,KYPHOPLASTY;  Surgeon: Melina Schools, MD;  Location: [place];  Service: Orthopedics;  Laterality: N/A;   KYPHOSIS SURGERY  08/2007   cement used   LEFT HEART CATH AND CORONARY ANGIOGRAPHY N/A 02/23/2022   Procedure: LEFT HEART CATH AND CORONARY ANGIOGRAPHY;  Surgeon: Martinique, Peter M, MD;  Location: [place] [facility];  Service: Cardiovascular;  Laterality:  N/A;   LEFT HEART CATH AND CORONARY ANGIOGRAPHY N/A 02/26/2022   Procedure: LEFT HEART CATH AND CORONARY ANGIOGRAPHY;  Surgeon: Nelva Bush, MD;  Location: [place] CV LAB;  Service: Cardiovascular;  Laterality: N/A;   OVARIAN CYST SURGERY     left   RADIOLOGY WITH ANESTHESIA N/A 11/10/2019   Procedure: VERTEBROPLASTY;  Surgeon: Luanne Bras, MD;  Location: [place];  Service: Radiology;  Laterality: N/A;   TEMPORARY PACEMAKER N/A 02/26/2022   Procedure: TEMPORARY PACEMAKER;  Surgeon: Nelva Bush, MD;  Location: [place] [facility] LAB;  Service: Cardiovascular;  Laterality: N/A;   TONSILLECTOMY     TOTAL KNEE ARTHROPLASTY  06/2010   left   TUBAL LIGATION      Family History  Problem Relation Age of Onset   Breast cancer Mother    Heart disease Father        MIs   Colon cancer Other        cousin   Breast cancer Sister    Leukemia Brother        GM    Social History:  reports that she has been smoking cigarettes. She started smoking about 71 years ago. She has a 37.00 pack-year smoking history. She has never used smokeless tobacco. She reports that she does not currently use alcohol after a past usage of about 1.0 standard drink of alcohol per week. She reports that she does not use drugs.  Allergies:  Allergies  Allergen Reactions   Penicillins Hives, Rash and Other (See Comments)    Has patient had a PCN reaction causing immediate rash, facial/tongue/throat swelling, SOB or lightheadedness with hypotension: Yes Has patient had a PCN reaction causing severe rash involving mucus membranes or skin necrosis: yes Has patient had a PCN reaction that required hospitalization: No Has patient had a PCN reaction occurring within the last 10 years: no If all of the above answers are "NO", then may proceed with Cephalosporin use.  Other Reaction: OTHER REACTION   Valsartan Itching and Other (See Comments)   Atorvastatin Diarrhea   Carvedilol Other (See Comments)    "teeth  hurt when I took it"    Ezetimibe Nausea Only   Fluvastatin Sodium Other (See Comments)    "Can't remember"   Magnesium Hydroxide Other (See Comments)    REACTION: triggers HAs   Meloxicam Other (See Comments)    Pt seeing auro's / spots.     Pneumovax [Pneumococcal Polysaccharide Vaccine] Other (See Comments)    Local reaction   Quinapril Hcl Other (See Comments)     "feelings of tiredness"   Simvastatin Diarrhea   Topamax [Topiramate] Itching   Vit D-Vit E-Safflower Oil Other (See Comments)    Headaches    Medications: I have reviewed the patient's current medications.  Results for orders placed or performed during the hospital encounter of 07/23/22 (from the past 48 hour(s))  CBC with Differential     Status: Abnormal   Collection Time: 07/23/22  11:09 PM  Result Value Ref Range   WBC 9.6 4.0 - 10.5 K/uL   RBC 4.97 3.87 - 5.11 MIL/uL   Hemoglobin 13.3 12.0 - 15.0 g/dL   HCT 42.0 36.0 - 46.0 %   MCV 84.5 80.0 - 100.0 fL   MCH 26.8 26.0 - 34.0 pg   MCHC 31.7 30.0 - 36.0 g/dL   RDW 14.9 11.5 - 15.5 %   Platelets 493 (H) 150 - 400 K/uL   nRBC 0.0 0.0 - 0.2 %   Neutrophils Relative % 82 %   Neutro Abs 7.8 (H) 1.7 - 7.7 K/uL   Lymphocytes Relative 8 %   Lymphs Abs 0.7 0.7 - 4.0 K/uL   Monocytes Relative 8 %   Monocytes Absolute 0.8 0.1 - 1.0 K/uL   Eosinophils Relative 1 %   Eosinophils Absolute 0.1 0.0 - 0.5 K/uL   Basophils Relative 1 %   Basophils Absolute 0.1 0.0 - 0.1 K/uL   Immature Granulocytes 0 %   Abs Immature Granulocytes 0.04 0.00 - 0.07 K/uL    Comment: Performed at [facility] [address], [place], [place] [address]  I-stat chem 8, ED (not at [facility], [facility] or [facility])     Status: Abnormal   Collection Time: 07/23/22 11:44 PM  Result Value Ref Range   Sodium 135 135 - 145 mmol/L   Potassium 3.5 3.5 - 5.1 mmol/L   Chloride 99 98 - 111 mmol/L   BUN 15 8 - 23 mg/dL   Creatinine, Ser 1.00 0.44 - 1.00 mg/dL   Glucose, Bld 142 (H) 70 - 99 mg/dL     Comment: Glucose reference range applies only to samples taken after fasting for at least 8 hours.   Calcium, Ion 1.00 (L) 1.15 - 1.40 mmol/L   TCO2 29 22 - 32 mmol/L   Hemoglobin 14.6 12.0 - 15.0 g/dL   HCT 43.0 36.0 - [id number] %  Basic metabolic panel     Status: Abnormal   Collection Time: 07/24/22  7:15 AM  Result Value Ref Range   Sodium 136 135 - 145 mmol/L   Potassium 3.4 (L) 3.5 - 5.1 mmol/L   Chloride 99 98 - 111 mmol/L   CO2 26 22 - 32 mmol/L   Glucose, Bld 160 (H) 70 - 99 mg/dL    Comment: Glucose reference range applies only to samples taken after fasting for at least 8 hours.   BUN 12 8 - 23 mg/dL   Creatinine, Ser 0.77 0.44 - 1.00 mg/dL   Calcium 8.7 (L) 8.9 - 10.3 mg/dL   GFR, Estimated >60 >60 mL/min    Comment: (NOTE) Calculated using the CKD-EPI Creatinine Equation (2021)    Anion gap 11 5 - 15    Comment: Performed at [facility] [address], [place], [place] [address]  CBC with Differential/Platelet     Status: Abnormal   Collection Time: 07/24/22  7:15 AM  Result Value Ref Range   WBC 13.5 (H) 4.0 - 10.5 K/uL   RBC 4.55 3.87 - 5.11 MIL/uL   Hemoglobin 12.1 12.0 - 15.0 g/dL   HCT 37.8 36.0 - 46.0 %   MCV 83.1 80.0 - 100.0 fL   MCH 26.6 26.0 - 34.0 pg   MCHC 32.0 30.0 - 36.0 g/dL   RDW 14.9 11.5 - 15.5 %   Platelets 472 (H) 150 - 400 K/uL   nRBC 0.0 0.0 - 0.2 %   Neutrophils Relative % 89 %  Neutro Abs 11.9 (H) 1.7 - 7.7 K/uL   Lymphocytes Relative 3 %   Lymphs Abs 0.4 (L) 0.7 - 4.0 K/uL   Monocytes Relative 8 %   Monocytes Absolute 1.1 (H) 0.1 - 1.0 K/uL   Eosinophils Relative 0 %   Eosinophils Absolute 0.0 0.0 - 0.5 K/uL   Basophils Relative 0 %   Basophils Absolute 0.1 0.0 - 0.1 K/uL   Immature Granulocytes 0 %   Abs Immature Granulocytes 0.06 0.00 - 0.07 K/uL    Comment: Performed at [facility] [address], [place], [place] [address]   *Note: Due to a large number of results and/or encounters for the requested time period,  some results have not been displayed. A complete set of results can be found in Results Review.    DG FEMUR PORT, 1V RIGHT  Result Date: 07/24/2022 CLINICAL DATA:  Pain post fall yesterday EXAM: RIGHT FEMUR PORTABLE 1 VIEW COMPARISON:  07/23/2022 FINDINGS: Displaced intertrochanteric fracture RIGHT femur with varus angulation. No dislocation. Osseous demineralization with narrowing of RIGHT hip joint and RIGHT knee joint. Visualized pelvis intact. Scattered atherosclerotic calcifications. IMPRESSION: Displaced angulated intertrochanteric fracture RIGHT femur. Osseous demineralization with degenerative changes RIGHT hip and RIGHT knee. Electronically Signed   By: Lavonia Dana M.D.   On: 07/24/2022 09:20   CT Head Wo Contrast  Result Date: 07/24/2022 CLINICAL DATA:  Polytrauma, blunt.  Fall from standing position. EXAM: CT HEAD WITHOUT CONTRAST CT CERVICAL SPINE WITHOUT CONTRAST TECHNIQUE: Multidetector CT imaging of the head and cervical spine was performed following the standard protocol without intravenous contrast. Multiplanar CT image reconstructions of the cervical spine were also generated. RADIATION DOSE REDUCTION: This exam was performed according to the departmental dose-optimization program which includes automated exposure control, adjustment of the mA and/or kV according to patient size and/or use of iterative reconstruction technique. COMPARISON:  11/24/2021. FINDINGS: CT HEAD FINDINGS Brain: No acute intracranial hemorrhage, midline shift or mass effect. No extra-axial fluid collection. Extensive subcortical and periventricular white matter hypodensities are present bilaterally. There is encephalomalacia in the frontal lobe on the right. An old infarct is noted in the thalamus on the right. There is encephalomalacia in the parietal lobe on the left which is new from the prior exam. There is an old lacunar infarct in the left cerebellar hemisphere. Vascular: No hyperdense vessel or unexpected  calcification. Skull: Normal. Negative for fracture or focal lesion. Sinuses/Orbits: No acute finding. Other: None. CT CERVICAL SPINE FINDINGS Alignment: Normal. Skull base and vertebrae: No acute fracture in the cervical spine. There is an old wedge shaped compression deformity in the superior endplate at T1. Soft tissues and spinal canal: No prevertebral fluid or swelling. No visible canal hematoma. Disc levels: Multilevel intervertebral disc space narrowing, uncovertebral osteophyte formation, and facet arthropathy. No significant spinal canal stenosis. Upper chest: Scarring is noted at the lung apices. Other: Carotid artery calcifications are noted. IMPRESSION: 1. No acute intracranial hemorrhage. 2. Chronic microvascular ischemic changes and old infarcts. Encephalomalacia is noted in the parietal lobe on the left which is new from the previous exam, although likely nonacute. If there is concern for acute infarct, MRI is suggested for further evaluation. 3. Multilevel degenerative changes in the cervical spine without evidence of acute fracture. 4. Chronic compression deformity in the superior endplate at T1. Electronically Signed   By: Brett Fairy M.D.   On: 07/24/2022 00:29   CT Cervical Spine Wo Contrast  Result Date: 07/24/2022 CLINICAL DATA:  Polytrauma, blunt.  Fall from standing position. EXAM: CT HEAD WITHOUT CONTRAST CT CERVICAL SPINE WITHOUT CONTRAST TECHNIQUE: Multidetector CT imaging of the head and cervical spine was performed following the standard protocol without intravenous contrast. Multiplanar CT image reconstructions of the cervical spine were also generated. RADIATION DOSE REDUCTION: This exam was performed according to the departmental dose-optimization program which includes automated exposure control, adjustment of the mA and/or kV according to patient size and/or use of iterative reconstruction technique. COMPARISON:  11/24/2021. FINDINGS: CT HEAD FINDINGS Brain: No acute  intracranial hemorrhage, midline shift or mass effect. No extra-axial fluid collection. Extensive subcortical and periventricular white matter hypodensities are present bilaterally. There is encephalomalacia in the frontal lobe on the right. An old infarct is noted in the thalamus on the right. There is encephalomalacia in the parietal lobe on the left which is new from the prior exam. There is an old lacunar infarct in the left cerebellar hemisphere. Vascular: No hyperdense vessel or unexpected calcification. Skull: Normal. Negative for fracture or focal lesion. Sinuses/Orbits: No acute finding. Other: None. CT CERVICAL SPINE FINDINGS Alignment: Normal. Skull base and vertebrae: No acute fracture in the cervical spine. There is an old wedge shaped compression deformity in the superior endplate at T1. Soft tissues and spinal canal: No prevertebral fluid or swelling. No visible canal hematoma. Disc levels: Multilevel intervertebral disc space narrowing, uncovertebral osteophyte formation, and facet arthropathy. No significant spinal canal stenosis. Upper chest: Scarring is noted at the lung apices. Other: Carotid artery calcifications are noted. IMPRESSION: 1. No acute intracranial hemorrhage. 2. Chronic microvascular ischemic changes and old infarcts. Encephalomalacia is noted in the parietal lobe on the left which is new from the previous exam, although likely nonacute. If there is concern for acute infarct, MRI is suggested for further evaluation. 3. Multilevel degenerative changes in the cervical spine without evidence of acute fracture. 4. Chronic compression deformity in the superior endplate at T1. Electronically Signed   By: Brett Fairy M.D.   On: 07/24/2022 00:29   DG Shoulder Right  Result Date: 07/23/2022 CLINICAL DATA:  Fall EXAM: RIGHT SHOULDER - 2+ VIEW COMPARISON:  None Available. FINDINGS: AC joint appears intact with moderate degenerative change. Acute comminuted proximal humerus fracture  involves the humeral neck and greater tuberosity. The humeral head appears to project over glenoid. About 1/2 shaft diameter anterior displacement of distal fracture fragment IMPRESSION: Acute comminuted and displaced proximal humerus fracture involving the humeral neck and greater tuberosity. Electronically Signed   By: Donavan Foil M.D.   On: 07/23/2022 23:52   DG Chest Portable 1 View  Result Date: 07/23/2022 CLINICAL DATA:  Fall EXAM: PORTABLE CHEST 1 VIEW COMPARISON:  02/06/2022, CT 10/01/2021 FINDINGS: Acute comminuted proximal right humerus fracture. No acute airspace disease or pleural effusion. Retrocardiac opacity corresponding to moderate to large hiatal hernia. Aortic atherosclerosis. Multiple treated compression deformities of the spine. Postsurgical changes of the left breast. Old appearing left-sided rib fractures. Acute appearing right fifth, sixth and possibly seventh lateral rib fractures. IMPRESSION: 1. Acute comminuted proximal right humerus fracture. 2. Acute appearing right fifth, sixth and possibly seventh lateral rib fractures. 3. Hiatal hernia. 4. No acute airspace disease. Electronically Signed   By: Donavan Foil M.D.   On: 07/23/2022 23:50   DG Hip Unilat W or Wo Pelvis 2-3 Views Right  Result Date: 07/23/2022 CLINICAL DATA:  Fall EXAM: DG HIP (WITH OR WITHOUT PELVIS) 2-3V RIGHT COMPARISON:  11/24/2021 FINDINGS: SI joints are non widened. Pubic symphysis appears intact. Previous intramedullary rodding  of the left femur with similar appearance of hardware. Old appearing deformity of the left inferior pubic ramus. Acute comminuted and displaced right intertrochanteric fracture. Right femoral head projects in joint. IMPRESSION: Acute comminuted and displaced right intertrochanteric fracture. Electronically Signed   By: Donavan Foil M.D.   On: 07/23/2022 23:46    Review of Systems  HENT:  Negative for ear discharge, ear pain, hearing loss and tinnitus.   Eyes:  Negative for  photophobia and pain.  Respiratory:  Negative for cough and shortness of breath.   Cardiovascular:  Negative for chest pain.  Gastrointestinal:  Negative for abdominal pain, nausea and vomiting.  Genitourinary:  Negative for dysuria, flank pain, frequency and urgency.  Musculoskeletal:  Positive for arthralgias (Right shoulder, right hip). Negative for back pain, myalgias and neck pain.  Neurological:  Negative for dizziness and headaches.  Hematological:  Does not bruise/bleed easily.  Psychiatric/Behavioral:  The patient is not nervous/anxious.    Blood pressure 136/61, pulse 61, temperature 97.6 F (36.4 C), temperature source Oral, resp. rate 18, height '5\' 6"'$  (1.676 m), weight 56.2 kg, SpO2 97 %. Physical Exam Constitutional:      General: She is not in acute distress.    Appearance: She is well-developed. She is not diaphoretic.  HENT:     Head: Normocephalic and atraumatic.  Eyes:     General: No scleral icterus.       Right eye: No discharge.        Left eye: No discharge.     Conjunctiva/sclera: Conjunctivae normal.  Cardiovascular:     Rate and Rhythm: Normal rate and regular rhythm.  Pulmonary:     Effort: Pulmonary effort is normal. No respiratory distress.  Musculoskeletal:     Cervical back: Normal range of motion.     Comments: Right shoulder, elbow, wrist, digits- no skin wounds, nontender, no instability, no blocks to motion  Sens  Ax/R/M/U intact  Mot   Ax/ R/ PIN/ M/ AIN/ U intact  Rad 2+  RLE No traumatic wounds, ecchymosis, or rash  Mod TTP hip  No knee or ankle effusion  Knee stable to varus/ valgus and anterior/posterior stress  Sens DPN, SPN, TN intact  Motor EHL, ext, flex, evers 5/5  DP 1+, PT 1+, No significant edema  Skin:    General: Skin is warm and dry.  Neurological:     Mental Status: She is alert.  Psychiatric:        Mood and Affect: Mood normal.        Behavior: Behavior normal.     Assessment/Plan: Right humerus fx -- Plan  non-operative management with sling and NWB. Right hip fx -- Plan IMN tomorrow with Dr. Stann Mainland at [facility]. Please keep NPO after MN.    Lisette Abu, PA-C Orthopedic Surgery [phone] 07/24/2022, 10:51 AM

## 2022-07-24 NOTE — TOC CAGE-AID Note (Signed)
Transition of Care [facility]) - CAGE-AID Screening   Patient Details  Name: [name] MRN: [id number] Date of Birth: [birth year]  Transition of Care [facility]) CM/SW Contact:    Trudee Kuster, RN Phone Number: 07/24/2022, 6:29 AM   Clinical Narrative: Patient denies alcohol or drug use. Further education not offered at this time.   CAGE-AID Screening:    Have You Ever Felt You Ought to Cut Down on Your Drinking or Drug Use?: No Have People Annoyed You By Critizing Your Drinking Or Drug Use?: No Have You Felt Bad Or Guilty About Your Drinking Or Drug Use?: No Have You Ever Had a Drink or Used Drugs First Thing In The Morning to Steady Your Nerves or to Get Rid of a Hangover?: No CAGE-AID Score: 0  Substance Abuse Education Offered: No

## 2022-07-24 NOTE — Progress Notes (Addendum)
  Patient discussed with emergency department physician.  Has a right intertrochanteric hip fracture that will need to be fixed with intramedullary device.  Is on dual anticoagulant therapy and will need to wait until at least Wednesday for surgery.  She may be placed on Lovenox in the interim.  Will plan tentatively for surgery Wednesday.  Full consultation to come later today.  I will order full-length femur films on the right.  Plan for sling for comfort and Non op management for right proximal humerus fracture.  NWB x 2 weeks on right arm.

## 2022-07-24 NOTE — Progress Notes (Signed)
Patient was seen and examined.  Also called discussed with patient's daughter on the phone.  In brief, 89 year old with history of coronary artery disease, Takotsubo cardiomyopathy, paroxysmal A-fib currently on Eliquis, history of stroke, coronary artery disease on Plavix who is currently at assisted living facility in [place] brought to the emergency room with mechanical fall.  She lost her balance and fell on the floor.  Skeletal survey consistent with comminuted and displaced proximal humerus fracture, right fifth sixth and seventh lateral rib fractures, right intertrochanter fracture of the femur.  Admitted for symptom control and surgical intervention.  Seen by orthopedics.  Right proximal humerus fracture: Sling.  Pain medications.  Nonweightbearing.  Conservative management. Right hip fracture: Scheduled for ORIF tomorrow with Dr. Stann Mainland at [facility].  Will transfer to [facility]. Multiple rib fractures: Currently stable.  Chest physiotherapy.  Mobility after surgery.  Eliquis and Plavix on hold.  Total time spent: 35 minutes.  Same-day admit.  No charge visit.

## 2022-07-24 NOTE — Progress Notes (Signed)
Spoke with Dr Stann Mainland about patients surgery for tomorrow at [facility] 3/13.  Dr Stann Mainland thought it would be good for the patient to work on being transferred from [facility] to [facility] today if we could for her surgery tomorrow at [facility].  I spoke with Lorre Nick at [facility] to connect with the hospitalist regarding transferring this patient over to [facility] today for surgery tomrorow at [facility].    Jasmine Pang BSN, Academic librarian - Perioperative Services [place] [phone]

## 2022-07-25 ENCOUNTER — Encounter (HOSPITAL_COMMUNITY): Payer: Self-pay | Admitting: Family Medicine

## 2022-07-25 ENCOUNTER — Inpatient Hospital Stay (HOSPITAL_COMMUNITY): Payer: Medicare Other

## 2022-07-25 ENCOUNTER — Other Ambulatory Visit: Payer: Self-pay

## 2022-07-25 ENCOUNTER — Encounter (HOSPITAL_COMMUNITY)
Admission: EM | Disposition: A | Discharge status: Skilled Nursing Facility | Payer: Self-pay | Source: Home / Self Care | Attending: Internal Medicine

## 2022-07-25 ENCOUNTER — Inpatient Hospital Stay (HOSPITAL_COMMUNITY): Payer: Medicare Other | Admitting: Certified Registered"

## 2022-07-25 DIAGNOSIS — Z955 Presence of coronary angioplasty implant and graft: Secondary | ICD-10-CM

## 2022-07-25 DIAGNOSIS — I252 Old myocardial infarction: Secondary | ICD-10-CM

## 2022-07-25 DIAGNOSIS — I509 Heart failure, unspecified: Secondary | ICD-10-CM

## 2022-07-25 DIAGNOSIS — I110 Hypertensive heart disease with heart failure: Secondary | ICD-10-CM

## 2022-07-25 DIAGNOSIS — F17210 Nicotine dependence, cigarettes, uncomplicated: Secondary | ICD-10-CM

## 2022-07-25 DIAGNOSIS — I2510 Atherosclerotic heart disease of native coronary artery without angina pectoris: Secondary | ICD-10-CM

## 2022-07-25 DIAGNOSIS — S72141A Displaced intertrochanteric fracture of right femur, initial encounter for closed fracture: Secondary | ICD-10-CM

## 2022-07-25 DIAGNOSIS — S72001A Fracture of unspecified part of neck of right femur, initial encounter for closed fracture: Secondary | ICD-10-CM | POA: Diagnosis not present

## 2022-07-25 HISTORY — PX: FEMUR IM NAIL: SHX1597

## 2022-07-25 SURGERY — INSERTION, INTRAMEDULLARY ROD, FEMUR
Anesthesia: General | Laterality: Right

## 2022-07-25 MED ORDER — 0.9 % SODIUM CHLORIDE (POUR BTL) OPTIME
TOPICAL | Status: DC | PRN
  Administered 2022-07-25: 1000 mL

## 2022-07-25 MED ORDER — POVIDONE-IODINE 10 % EX SWAB
2.0000 | Freq: Once | CUTANEOUS | Status: AC
  Administered 2022-07-25: 2 via TOPICAL

## 2022-07-25 MED ORDER — PHENYLEPHRINE 80 MCG/ML (10ML) SYRINGE FOR IV PUSH (FOR BLOOD PRESSURE SUPPORT)
PREFILLED_SYRINGE | INTRAVENOUS | Status: DC | PRN
  Administered 2022-07-25: 160 ug via INTRAVENOUS

## 2022-07-25 MED ORDER — CEFAZOLIN SODIUM-DEXTROSE 2-4 GM/100ML-% IV SOLN
2.0000 g | INTRAVENOUS | Status: AC
  Administered 2022-07-25: 2 g via INTRAVENOUS

## 2022-07-25 MED ORDER — ONDANSETRON HCL 4 MG/2ML IJ SOLN: 4.0000 mg | Freq: Four times a day (QID) | INTRAMUSCULAR | Status: DC | PRN

## 2022-07-25 MED ORDER — FENTANYL CITRATE (PF) 250 MCG/5ML IJ SOLN
INTRAMUSCULAR | Status: AC
  Filled 2022-07-25: qty 5

## 2022-07-25 MED ORDER — ROCURONIUM BROMIDE 10 MG/ML (PF) SYRINGE
PREFILLED_SYRINGE | INTRAVENOUS | Status: DC | PRN
  Administered 2022-07-25: 40 mg via INTRAVENOUS

## 2022-07-25 MED ORDER — TRANEXAMIC ACID-NACL 1000-0.7 MG/100ML-% IV SOLN
1000.0000 mg | Freq: Once | INTRAVENOUS | Status: AC
  Filled 2022-07-25: qty 100

## 2022-07-25 MED ORDER — ROCURONIUM BROMIDE 10 MG/ML (PF) SYRINGE
PREFILLED_SYRINGE | INTRAVENOUS | Status: AC
  Filled 2022-07-25: qty 10

## 2022-07-25 MED ORDER — ACETAMINOPHEN 10 MG/ML IV SOLN
INTRAVENOUS | Status: DC | PRN
  Administered 2022-07-25: 850 mg via INTRAVENOUS

## 2022-07-25 MED ORDER — METOCLOPRAMIDE HCL 5 MG/ML IJ SOLN
5.0000 mg | Freq: Three times a day (TID) | INTRAMUSCULAR | Status: DC | PRN
  Administered 2022-07-26: 10 mg via INTRAVENOUS
  Filled 2022-07-25: qty 2

## 2022-07-25 MED ORDER — HYDROCODONE-ACETAMINOPHEN 5-325 MG PO TABS: 1.0000 | ORAL_TABLET | Freq: Four times a day (QID) | ORAL | 0 refills | Status: DC | PRN

## 2022-07-25 MED ORDER — CHLORHEXIDINE GLUCONATE 4 % EX LIQD: 60.0000 mL | Freq: Once | CUTANEOUS | Status: DC

## 2022-07-25 MED ORDER — OXYCODONE HCL 5 MG PO TABS: 5.0000 mg | ORAL_TABLET | Freq: Once | ORAL | Status: DC | PRN

## 2022-07-25 MED ORDER — OXYCODONE HCL 5 MG/5ML PO SOLN: 5.0000 mg | Freq: Once | ORAL | Status: DC | PRN

## 2022-07-25 MED ORDER — FENTANYL CITRATE (PF) 100 MCG/2ML IJ SOLN: 25.0000 ug | INTRAMUSCULAR | Status: DC | PRN

## 2022-07-25 MED ORDER — BUPIVACAINE HCL 0.25 % IJ SOLN
INTRAMUSCULAR | Status: DC | PRN
  Administered 2022-07-25: 30 mL

## 2022-07-25 MED ORDER — ONDANSETRON HCL 4 MG/2ML IJ SOLN
INTRAMUSCULAR | Status: AC
  Filled 2022-07-25: qty 2

## 2022-07-25 MED ORDER — MENTHOL 3 MG MT LOZG
1.0000 | LOZENGE | OROMUCOSAL | Status: DC | PRN
  Administered 2022-07-26: 3 mg via ORAL
  Filled 2022-07-25: qty 9

## 2022-07-25 MED ORDER — TRANEXAMIC ACID-NACL 1000-0.7 MG/100ML-% IV SOLN
1000.0000 mg | INTRAVENOUS | Status: AC
  Administered 2022-07-25: 1000 mg via INTRAVENOUS

## 2022-07-25 MED ORDER — PHENYLEPHRINE 80 MCG/ML (10ML) SYRINGE FOR IV PUSH (FOR BLOOD PRESSURE SUPPORT)
PREFILLED_SYRINGE | INTRAVENOUS | Status: AC
  Filled 2022-07-25: qty 10

## 2022-07-25 MED ORDER — METOCLOPRAMIDE HCL 5 MG PO TABS: 5.0000 mg | ORAL_TABLET | Freq: Three times a day (TID) | ORAL | Status: DC | PRN

## 2022-07-25 MED ORDER — SUGAMMADEX SODIUM 200 MG/2ML IV SOLN
INTRAVENOUS | Status: DC | PRN
  Administered 2022-07-25: 120 mg via INTRAVENOUS

## 2022-07-25 MED ORDER — PROPOFOL 10 MG/ML IV BOLUS
INTRAVENOUS | Status: DC | PRN
  Administered 2022-07-25: 60 mg via INTRAVENOUS

## 2022-07-25 MED ORDER — ONDANSETRON HCL 4 MG/2ML IJ SOLN
INTRAMUSCULAR | Status: DC | PRN
  Administered 2022-07-25: 4 mg via INTRAVENOUS

## 2022-07-25 MED ORDER — CEFAZOLIN SODIUM-DEXTROSE 2-4 GM/100ML-% IV SOLN
INTRAVENOUS | Status: AC
  Administered 2022-07-25: 2 g via INTRAVENOUS
  Filled 2022-07-25: qty 100

## 2022-07-25 MED ORDER — CEFAZOLIN SODIUM-DEXTROSE 2-4 GM/100ML-% IV SOLN
2.0000 g | Freq: Four times a day (QID) | INTRAVENOUS | Status: AC
  Administered 2022-07-26: 2 g via INTRAVENOUS
  Filled 2022-07-25 (×2): qty 100

## 2022-07-25 MED ORDER — PHENOL 1.4 % MT LIQD: 1.0000 | OROMUCOSAL | Status: DC | PRN

## 2022-07-25 MED ORDER — LIDOCAINE 2% (20 MG/ML) 5 ML SYRINGE
INTRAMUSCULAR | Status: DC | PRN
  Administered 2022-07-25: 60 mg via INTRAVENOUS

## 2022-07-25 MED ORDER — DEXAMETHASONE SODIUM PHOSPHATE 10 MG/ML IJ SOLN
INTRAMUSCULAR | Status: AC
  Filled 2022-07-25: qty 1

## 2022-07-25 MED ORDER — LIDOCAINE 2% (20 MG/ML) 5 ML SYRINGE
INTRAMUSCULAR | Status: AC
  Filled 2022-07-25: qty 5

## 2022-07-25 MED ORDER — DOCUSATE SODIUM 100 MG PO CAPS
100.0000 mg | ORAL_CAPSULE | Freq: Two times a day (BID) | ORAL | Status: DC
  Administered 2022-07-25 – 2022-07-28 (×6): 100 mg via ORAL
  Filled 2022-07-25 (×6): qty 1

## 2022-07-25 MED ORDER — TRANEXAMIC ACID-NACL 1000-0.7 MG/100ML-% IV SOLN
INTRAVENOUS | Status: AC
  Administered 2022-07-25: 1000 mg via INTRAVENOUS
  Filled 2022-07-25: qty 100

## 2022-07-25 MED ORDER — BUPIVACAINE HCL (PF) 0.25 % IJ SOLN
INTRAMUSCULAR | Status: AC
  Filled 2022-07-25: qty 30

## 2022-07-25 MED ORDER — FENTANYL CITRATE (PF) 250 MCG/5ML IJ SOLN
INTRAMUSCULAR | Status: DC | PRN
  Administered 2022-07-25 (×2): 25 ug via INTRAVENOUS

## 2022-07-25 MED ORDER — ONDANSETRON HCL 4 MG/2ML IJ SOLN: 4.0000 mg | Freq: Once | INTRAMUSCULAR | Status: DC | PRN

## 2022-07-25 MED ORDER — ACETAMINOPHEN 10 MG/ML IV SOLN
INTRAVENOUS | Status: AC
  Filled 2022-07-25: qty 100

## 2022-07-25 MED ORDER — CHLORHEXIDINE GLUCONATE 0.12 % MT SOLN
OROMUCOSAL | Status: AC
  Filled 2022-07-25: qty 15

## 2022-07-25 MED ORDER — PHENYLEPHRINE HCL-NACL 20-0.9 MG/250ML-% IV SOLN
INTRAVENOUS | Status: DC | PRN
  Administered 2022-07-25: 20 ug/min via INTRAVENOUS

## 2022-07-25 MED ORDER — LACTATED RINGERS IV SOLN: INTRAVENOUS | Status: DC | PRN

## 2022-07-25 MED ORDER — ONDANSETRON HCL 4 MG PO TABS: 4.0000 mg | ORAL_TABLET | Freq: Four times a day (QID) | ORAL | Status: DC | PRN

## 2022-07-25 MED ORDER — DEXAMETHASONE SODIUM PHOSPHATE 10 MG/ML IJ SOLN
INTRAMUSCULAR | Status: DC | PRN
  Administered 2022-07-25: 5 mg via INTRAVENOUS

## 2022-07-25 SURGICAL SUPPLY — 48 items
ALCOHOL 70% 16 OZ (MISCELLANEOUS) ×2 IMPLANT
BAG COUNTER SPONGE SURGICOUNT (BAG) ×2 IMPLANT
BAG SPNG CNTER NS LX DISP (BAG) ×1
BIT DRILL CANN HP 16 (BIT) IMPLANT
BIT DRILL CANN STP 6/9 HIP (BIT) IMPLANT
BIT DRILL LONG 4.2 (BIT) IMPLANT
BNDG CMPR 5X6 CHSV STRCH STRL (GAUZE/BANDAGES/DRESSINGS) ×1
BNDG COHESIVE 4X5 TAN STRL (GAUZE/BANDAGES/DRESSINGS) ×2 IMPLANT
BNDG COHESIVE 6X5 TAN ST LF (GAUZE/BANDAGES/DRESSINGS) ×2 IMPLANT
COVER PERINEAL POST (MISCELLANEOUS) ×2 IMPLANT
COVER SURGICAL LIGHT HANDLE (MISCELLANEOUS) ×2 IMPLANT
DRAPE C-ARMOR (DRAPES) IMPLANT
DRAPE HALF SHEET 40X57 (DRAPES) IMPLANT
DRAPE INCISE IOBAN 66X45 STRL (DRAPES) IMPLANT
DRAPE ORTHO SPLIT 77X108 STRL (DRAPES)
DRAPE STERI IOBAN 125X83 (DRAPES) ×6 IMPLANT
DRAPE SURG ORHT 6 SPLT 77X108 (DRAPES) IMPLANT
DRSG ADAPTIC 3X8 NADH LF (GAUZE/BANDAGES/DRESSINGS) IMPLANT
DRSG TEGADERM 2-3/8X2-3/4 SM (GAUZE/BANDAGES/DRESSINGS) IMPLANT
DRSG TEGADERM 4X4.75 (GAUZE/BANDAGES/DRESSINGS) ×4 IMPLANT
DURAPREP 26ML APPLICATOR (WOUND CARE) ×2 IMPLANT
ELECT REM PT RETURN 9FT ADLT (ELECTROSURGICAL) ×1
ELECTRODE REM PT RTRN 9FT ADLT (ELECTROSURGICAL) ×2 IMPLANT
FACESHIELD WRAPAROUND (MASK) IMPLANT
FACESHIELD WRAPAROUND OR TEAM (MASK) IMPLANT
GAUZE SPONGE 4X4 12PLY STRL (GAUZE/BANDAGES/DRESSINGS) IMPLANT
GAUZE SPONGE 4X4 12PLY STRL LF (GAUZE/BANDAGES/DRESSINGS) ×2 IMPLANT
GAUZE XEROFORM 1X8 LF (GAUZE/BANDAGES/DRESSINGS) ×2 IMPLANT
GLOVE BIO SURGEON STRL SZ7.5 (GLOVE) ×6 IMPLANT
GLOVE BIOGEL PI IND STRL 8 (GLOVE) ×4 IMPLANT
GOWN STRL REUS W/ TWL LRG LVL3 (GOWN DISPOSABLE) ×4 IMPLANT
GOWN STRL REUS W/ TWL XL LVL3 (GOWN DISPOSABLE) ×2 IMPLANT
GOWN STRL REUS W/TWL LRG LVL3 (GOWN DISPOSABLE) ×2
GOWN STRL REUS W/TWL XL LVL3 (GOWN DISPOSABLE) ×3 IMPLANT
GUIDEWIRE 3.2X400 (WIRE) IMPLANT
KIT BASIN OR (CUSTOM PROCEDURE TRAY) ×2 IMPLANT
KIT TURNOVER KIT B (KITS) ×2 IMPLANT
MANIFOLD NEPTUNE II (INSTRUMENTS) ×2 IMPLANT
NAIL TROCH FIX 10X235 RT 130 (Nail) IMPLANT
NS IRRIG 1000ML POUR BTL (IV SOLUTION) ×2 IMPLANT
PACK GENERAL/GYN (CUSTOM PROCEDURE TRAY) ×2 IMPLANT
PAD ARMBOARD 7.5X6 YLW CONV (MISCELLANEOUS) ×4 IMPLANT
SCREW FENES TFNA 100 (Screw) IMPLANT
SCREW LOCK IM 5X36 (Screw) ×1 IMPLANT
SCREW LOCK X25 36X5X IM (Screw) IMPLANT
STAPLER VISISTAT 35W (STAPLE) ×2 IMPLANT
SUT VIC AB 2-0 CT1 27 (SUTURE) ×2
SUT VIC AB 2-0 CT1 TAPERPNT 27 (SUTURE) ×4 IMPLANT

## 2022-07-25 NOTE — Transfer of Care (Signed)
Immediate Anesthesia Transfer of Care Note  Patient: [name]  Procedure(s) Performed: INTRAMEDULLARY (IM) NAIL FEMORAL (Right)  Patient Location: PACU  Anesthesia Type:General  Level of Consciousness: drowsy  Airway & Oxygen Therapy: Patient Spontanous Breathing and Patient connected to face mask oxygen  Post-op Assessment: Report given to RN and Post -op Vital signs reviewed and stable  Post vital signs: Reviewed and stable  Last Vitals:  Vitals Value Taken Time  BP 134/53 07/25/22 1701  Temp    Pulse 61 07/25/22 1707  Resp 15 07/25/22 1707  SpO2 100 % 07/25/22 1707  Vitals shown include unvalidated device data.  Last Pain:  Vitals:   07/25/22 1441  TempSrc:   PainSc: 10-Worst pain ever      Patients Stated Pain Goal: 0 ([id number])  Complications: No notable events documented.

## 2022-07-25 NOTE — Brief Op Note (Signed)
07/23/2022 - 07/25/2022  4:37 PM  PATIENT:  [name]  89 y.o. female  PRE-OPERATIVE DIAGNOSIS: Right hip intertrochanteric tracture  POST-OPERATIVE DIAGNOSIS: Right hip intertrochanteric tracture  PROCEDURE:  Procedure(s) with comments: INTRAMEDULLARY (IM) NAIL FEMORAL (Right) - hana table,carm.synthesis  SURGEON:  Surgeon(s) and Role:    * Chaniya Genter, Elly Modena, MD - Primary  PHYSICIAN ASSISTANT: Jonelle Sidle, PA-C  ANESTHESIA:   local and general  EBL:  75 cc  BLOOD ADMINISTERED:none  DRAINS: none   LOCAL MEDICATIONS USED:  MARCAINE     SPECIMEN:  No Specimen  DISPOSITION OF SPECIMEN:  N/A  COUNTS:  YES  TOURNIQUET:  * No tourniquets in log *  DICTATION: .Note written in EPIC  PLAN OF CARE: Admit to inpatient   PATIENT DISPOSITION:  PACU - hemodynamically stable.   Delay start of Pharmacological VTE agent (>24hrs) due to surgical blood loss or risk of bleeding: not applicable

## 2022-07-25 NOTE — H&P (Signed)
H&P update  The surgical history has been reviewed and remains accurate without interval change.  The patient was re-examined and patient's physiologic condition has not changed significantly in the last 30 days. The condition still exists that makes this procedure necessary. The treatment plan remains the same, without new options for care.  No new pharmacological allergies or types of therapy has been initiated that would change the plan or the appropriateness of the plan.  The patient and/or family understand the potential benefits and risks.  Kerby Borner P. Stann Mainland, MD 07/25/2022 1:37 PM

## 2022-07-25 NOTE — Op Note (Signed)
Date of Surgery: 07/25/2022  INDICATIONS: Ms. Ricketts is a 89 y.o.-year-old female who sustained a right hip fracture. The risks and benefits of the procedure discussed with the patient prior to the procedure and all questions were answered; consent was obtained.  PREOPERATIVE DIAGNOSIS: right hip fracture   POSTOPERATIVE DIAGNOSIS: Same   PROCEDURE: Treatment of intertrochanteric, pertrochanteric, subtrochanteric fracture with intramedullary implant. CPT [phone]   SURGEON: Dannielle Karvonen. Stann Mainland, M.D.   Assistant: Jonelle Sidle, PA-C  Assistant Attestation:  PA McClung was present and scrubbed for the entire procedure  ANESTHESIA: general   IV FLUIDS AND URINE: See anesthesia record   ESTIMATED BLOOD LOSS: 75 cc  IMPLANTS:  Synthes TFN A 10 x 235 mm 100 mm proximal compression screw 5.0 mm distal interlocking bolte  DRAINS: None.   COMPLICATIONS: None.   DESCRIPTION OF PROCEDURE: The patient was brought to the operating room and placed supine on the operating table. The patient's leg had been signed prior to the procedure. The patient had the anesthesia placed by the anesthesiologist. The prep verification and incision time-outs were performed to confirm that this was the correct patient, site, side and location. The patient had an SCD on the opposite lower extremity. The patient did receive antibiotics prior to the incision and was re-dosed during the procedure as needed at indicated intervals. The patient was positioned on the fracture table with the table in traction and internal rotation to reduce the hip. The well leg was placed in a scissor position and all bony prominences were well-padded. The patient had the lower extremity prepped and draped in the standard surgical fashion. The incision was made 4 finger breadths superior to the greater trochanter. A guide pin was inserted into the tip of the greater trochanter under fluoroscopic guidance. An opening reamer was used to gain  access to the femoral canal. The nail length was measured and inserted down the femoral canal to its proper depth. The appropriate version of insertion for the lag screw was found under fluoroscopy. A pin was inserted up the femoral neck through the jig. The length of the lag screw was then measured. The lag screw was inserted as near to center-center in the head as possible. The leg was taken out of traction, then the compression screw was used to compress across the fracture. Compression was visualized on serial xrays.   We next turned our attention to the distal interlocking screw.  This was placed through the drill guide of the nail inserter.  A small incision was made overlying the lateral thigh at the screw site, and a tonsil was used to disect down to bone.  A drill pass was made through the jig and across the nail through both cortices.  This was measured, and the appropriate screw was placed under hand power and found to have good bite.    The wound was copiously irrigated with saline and the subcutaneous layer closed with 2.0 vicryl and the skin was reapproximated with staples. The wounds were cleaned and dried a final time and a sterile dressing was placed. The hip was taken through a range of motion at the end of the case under fluoroscopic imaging to visualize the approach-withdraw phenomenon and confirm implant length in the head. The patient was then awakened from anesthesia and taken to the recovery room in stable condition. All counts were correct at the end of the case.   POSTOPERATIVE PLAN: The patient will be weight bearing as tolerated and will return  in 2 weeks for staple removal and the patient will receive DVT prophylaxis based on other medications, activity level, and risk ratio of bleeding to thrombosis.    She will resume preop anticoagulants on POD #1 am.  For the RUE ok to [place] Only, but will maintain with no AROM over shoulder height and sling when not using  walker.   Geralynn Rile, MD [facility] [place] [phone] 4:38 PM

## 2022-07-25 NOTE — Discharge Instructions (Addendum)
Orthopedic surgery discharge instructions:  -Okay for full weightbearing as tolerated to the right lower extremity.  Maintain your postoperative bandage until your follow-up appointment with Dr. Stann Mainland in 2 weeks.  This is a waterproof bandage and you may begin showering on postoperative day #2.  However, do not submerge underwater.  -You should apply ice liberally to the right hip incisions as well as the right shoulder throughout the day 20 to 30 minutes each session.  -For the right shoulder is okay to weight-bear through the arm when using her walker.  Otherwise she will not use the arm for any activities other than activities of daily living.  No lifting the arm overhead until cleared by Dr. Stann Mainland.  You should sleep with your sling on the right arm. Ok to come out of the sling for elbow /hand ROM only.   -For mild to moderate pain use Tylenol and ice around-the-clock.  For any breakthrough pain use hydrocodone as necessary.  -You should return to using your preoperative anticoagulation before leaving the hospital.  -Follow-up with Dr. Stann Mainland in 2 weeks for routine postoperative x-rays and wound check.

## 2022-07-25 NOTE — Anesthesia Procedure Notes (Signed)
Procedure Name: Intubation Date/Time: 07/25/2022 3:53 PM  Performed by: Reggie Pile, CRNAPre-anesthesia Checklist: Patient identified, Emergency Drugs available, Suction available and Patient being monitored Patient Re-evaluated:Patient Re-evaluated prior to induction Oxygen Delivery Method: Circle system utilized Preoxygenation: Pre-oxygenation with 100% oxygen Induction Type: IV induction Ventilation: Mask ventilation without difficulty Laryngoscope Size: Miller and 2 Tube type: Oral Tube size: 7.0 mm Number of attempts: 1 Airway Equipment and Method: Stylet and Oral airway Placement Confirmation: ETT inserted through vocal cords under direct vision, positive ETCO2 and breath sounds checked- equal and bilateral Secured at: 21 cm Tube secured with: Tape Dental Injury: Teeth and Oropharynx as per pre-operative assessment

## 2022-07-25 NOTE — Progress Notes (Signed)
PROGRESS NOTE    Dawn CASTELAN  [id number] DOB: Jun 18, 1929 DOA: 07/23/2022 PCP: Colon Branch, MD    Brief Narrative:  89 year old with history of coronary artery disease, Takotsubo cardiomyopathy, paroxysmal A-fib currently on Eliquis, history of stroke, coronary artery disease on Plavix who is currently at assisted living facility in [place] brought to the emergency room with mechanical fall. She lost her balance and fell on the floor. Skeletal survey consistent with comminuted and displaced proximal humerus fracture, right fifth sixth and seventh lateral rib fractures, right intertrochanter fracture of the femur. Admitted for symptom control and surgical intervention. Seen by orthopedics.    Assessment & Plan:   Right hip fracture: N.p.o., SCDs, pain medications, nonweightbearing.  Scheduled for ORIF today.  PT OT and postop plan as per surgery.  Right proximal humerus fracture: Sling, conservative management.  Paroxysmal A-fib on Eliquis: Currently sinus rhythm.  Rate controlled on amiodarone and metoprolol.  Eliquis on hold.  History of stroke and coronary artery disease: Currently stable.  Plavix on hold.  Dementia without behavioral disturbances: Long-term ALF resident.  She will probably need skilled nursing facility placement.  DVT prophylaxis: enoxaparin (LOVENOX) injection 40 mg Start: 07/24/22 2200   Code Status: Full code Family Communication: None today Disposition Plan: Status is: Inpatient Remains inpatient appropriate because: Inpatient surgery     Consultants:  Orthopedics  Procedures:  Planned  Antimicrobials:  None   Subjective: Patient seen in the morning rounds.  Poor historian.  She tells me that she does not feel like going to surgery today.  She does not remember what surgery she is going to.  No other overnight events.  Her shoulder hurts.  No family at the bedside in the morning rounds.  Objective: Vitals:   07/24/22 2052 07/25/22  0008 07/25/22 0528 07/25/22 0728  BP: (!) 141/62 128/66 137/60 139/64  Pulse: 64 66 64 66  Resp: '17 17 17 18  '$ Temp: 98.4 F (36.9 C) 98.6 F (37 C) 98.1 F (36.7 C) 98.2 F (36.8 C)  TempSrc: Oral Oral Oral Oral  SpO2: 97% 100% 95% 99%  Weight:      Height:        Intake/Output Summary (Last 24 hours) at 07/25/2022 1205 Last data filed at 07/25/2022 0909 Gross per 24 hour  Intake --  Output 800 ml  Net -800 ml   Filed Weights   07/23/22 2327  Weight: 56.2 kg    Examination:  General exam: Appears anxious.  Frail and debilitated.  Not in any distress. Respiratory system: No added sounds. Cardiovascular system: S1 & S2 heard, RRR.  No edema.   Gastrointestinal system: Soft and nontender. Central nervous system: Alert and awake.  Not Oriented. Right arm on sling.   Left leg externally rotated.  Distal neurovascular status intact.   Data Reviewed: I have personally reviewed following labs and imaging studies  CBC: Recent Labs  Lab 07/23/22 2309 07/23/22 2344 07/24/22 0715  WBC 9.6  --  13.5*  NEUTROABS 7.8*  --  11.9*  HGB 13.3 14.6 12.1  HCT 42.0 43.0 37.8  MCV 84.5  --  83.1  PLT 493*  --  99991111*   Basic Metabolic Panel: Recent Labs  Lab 07/23/22 2344 07/24/22 0715  NA 135 136  K 3.5 3.4*  CL 99 99  CO2  --  26  GLUCOSE 142* 160*  BUN 15 12  CREATININE 1.00 0.77  CALCIUM  --  8.7*   GFR: Estimated Creatinine Clearance: 41.5  mL/min (by C-G formula based on SCr of 0.77 mg/dL). Liver Function Tests: No results for input(s): "AST", "ALT", "ALKPHOS", "BILITOT", "PROT", "ALBUMIN" in the last 168 hours. No results for input(s): "LIPASE", "AMYLASE" in the last 168 hours. No results for input(s): "AMMONIA" in the last 168 hours. Coagulation Profile: No results for input(s): "INR", "PROTIME" in the last 168 hours. Cardiac Enzymes: No results for input(s): "CKTOTAL", "CKMB", "CKMBINDEX", "TROPONINI" in the last 168 hours. BNP (last 3 results) No results  for input(s): "PROBNP" in the last 8760 hours. HbA1C: No results for input(s): "HGBA1C" in the last 72 hours. CBG: Recent Labs  Lab 07/24/22 1156  GLUCAP 143*   Lipid Profile: No results for input(s): "CHOL", "HDL", "LDLCALC", "TRIG", "CHOLHDL", "LDLDIRECT" in the last 72 hours. Thyroid Function Tests: No results for input(s): "TSH", "T4TOTAL", "FREET4", "T3FREE", "THYROIDAB" in the last 72 hours. Anemia Panel: No results for input(s): "VITAMINB12", "FOLATE", "FERRITIN", "TIBC", "IRON", "RETICCTPCT" in the last 72 hours. Sepsis Labs: No results for input(s): "PROCALCITON", "LATICACIDVEN" in the last 168 hours.  Recent Results (from the past 240 hour(s))  MRSA Next Gen by PCR, Nasal     Status: None   Collection Time: 07/24/22  8:23 PM   Specimen: Nasal Mucosa; Nasal Swab  Result Value Ref Range Status   MRSA by PCR Next Gen NOT DETECTED NOT DETECTED Final    Comment: (NOTE) The GeneXpert MRSA Assay (FDA approved for NASAL specimens only), is one component of a comprehensive MRSA colonization surveillance program. It is not intended to diagnose MRSA infection nor to guide or monitor treatment for MRSA infections. Test performance is not FDA approved in patients less than 16 years old. Performed at [facility], [place] [address] [place], [place] [address]          Radiology Studies: DG FEMUR PORT, 1V RIGHT  Result Date: 07/24/2022 CLINICAL DATA:  Pain post fall yesterday EXAM: RIGHT FEMUR PORTABLE 1 VIEW COMPARISON:  07/23/2022 FINDINGS: Displaced intertrochanteric fracture RIGHT femur with varus angulation. No dislocation. Osseous demineralization with narrowing of RIGHT hip joint and RIGHT knee joint. Visualized pelvis intact. Scattered atherosclerotic calcifications. IMPRESSION: Displaced angulated intertrochanteric fracture RIGHT femur. Osseous demineralization with degenerative changes RIGHT hip and RIGHT knee. Electronically Signed   By: Lavonia Dana M.D.   On:  07/24/2022 09:20   CT Head Wo Contrast  Result Date: 07/24/2022 CLINICAL DATA:  Polytrauma, blunt.  Fall from standing position. EXAM: CT HEAD WITHOUT CONTRAST CT CERVICAL SPINE WITHOUT CONTRAST TECHNIQUE: Multidetector CT imaging of the head and cervical spine was performed following the standard protocol without intravenous contrast. Multiplanar CT image reconstructions of the cervical spine were also generated. RADIATION DOSE REDUCTION: This exam was performed according to the departmental dose-optimization program which includes automated exposure control, adjustment of the mA and/or kV according to patient size and/or use of iterative reconstruction technique. COMPARISON:  11/24/2021. FINDINGS: CT HEAD FINDINGS Brain: No acute intracranial hemorrhage, midline shift or mass effect. No extra-axial fluid collection. Extensive subcortical and periventricular white matter hypodensities are present bilaterally. There is encephalomalacia in the frontal lobe on the right. An old infarct is noted in the thalamus on the right. There is encephalomalacia in the parietal lobe on the left which is new from the prior exam. There is an old lacunar infarct in the left cerebellar hemisphere. Vascular: No hyperdense vessel or unexpected calcification. Skull: Normal. Negative for fracture or focal lesion. Sinuses/Orbits: No acute finding. Other: None. CT CERVICAL SPINE FINDINGS Alignment: Normal. Skull  base and vertebrae: No acute fracture in the cervical spine. There is an old wedge shaped compression deformity in the superior endplate at T1. Soft tissues and spinal canal: No prevertebral fluid or swelling. No visible canal hematoma. Disc levels: Multilevel intervertebral disc space narrowing, uncovertebral osteophyte formation, and facet arthropathy. No significant spinal canal stenosis. Upper chest: Scarring is noted at the lung apices. Other: Carotid artery calcifications are noted. IMPRESSION: 1. No acute intracranial  hemorrhage. 2. Chronic microvascular ischemic changes and old infarcts. Encephalomalacia is noted in the parietal lobe on the left which is new from the previous exam, although likely nonacute. If there is concern for acute infarct, MRI is suggested for further evaluation. 3. Multilevel degenerative changes in the cervical spine without evidence of acute fracture. 4. Chronic compression deformity in the superior endplate at T1. Electronically Signed   By: Brett Fairy M.D.   On: 07/24/2022 00:29   CT Cervical Spine Wo Contrast  Result Date: 07/24/2022 CLINICAL DATA:  Polytrauma, blunt.  Fall from standing position. EXAM: CT HEAD WITHOUT CONTRAST CT CERVICAL SPINE WITHOUT CONTRAST TECHNIQUE: Multidetector CT imaging of the head and cervical spine was performed following the standard protocol without intravenous contrast. Multiplanar CT image reconstructions of the cervical spine were also generated. RADIATION DOSE REDUCTION: This exam was performed according to the departmental dose-optimization program which includes automated exposure control, adjustment of the mA and/or kV according to patient size and/or use of iterative reconstruction technique. COMPARISON:  11/24/2021. FINDINGS: CT HEAD FINDINGS Brain: No acute intracranial hemorrhage, midline shift or mass effect. No extra-axial fluid collection. Extensive subcortical and periventricular white matter hypodensities are present bilaterally. There is encephalomalacia in the frontal lobe on the right. An old infarct is noted in the thalamus on the right. There is encephalomalacia in the parietal lobe on the left which is new from the prior exam. There is an old lacunar infarct in the left cerebellar hemisphere. Vascular: No hyperdense vessel or unexpected calcification. Skull: Normal. Negative for fracture or focal lesion. Sinuses/Orbits: No acute finding. Other: None. CT CERVICAL SPINE FINDINGS Alignment: Normal. Skull base and vertebrae: No acute fracture  in the cervical spine. There is an old wedge shaped compression deformity in the superior endplate at T1. Soft tissues and spinal canal: No prevertebral fluid or swelling. No visible canal hematoma. Disc levels: Multilevel intervertebral disc space narrowing, uncovertebral osteophyte formation, and facet arthropathy. No significant spinal canal stenosis. Upper chest: Scarring is noted at the lung apices. Other: Carotid artery calcifications are noted. IMPRESSION: 1. No acute intracranial hemorrhage. 2. Chronic microvascular ischemic changes and old infarcts. Encephalomalacia is noted in the parietal lobe on the left which is new from the previous exam, although likely nonacute. If there is concern for acute infarct, MRI is suggested for further evaluation. 3. Multilevel degenerative changes in the cervical spine without evidence of acute fracture. 4. Chronic compression deformity in the superior endplate at T1. Electronically Signed   By: Brett Fairy M.D.   On: 07/24/2022 00:29   DG Shoulder Right  Result Date: 07/23/2022 CLINICAL DATA:  Fall EXAM: RIGHT SHOULDER - 2+ VIEW COMPARISON:  None Available. FINDINGS: AC joint appears intact with moderate degenerative change. Acute comminuted proximal humerus fracture involves the humeral neck and greater tuberosity. The humeral head appears to project over glenoid. About 1/2 shaft diameter anterior displacement of distal fracture fragment IMPRESSION: Acute comminuted and displaced proximal humerus fracture involving the humeral neck and greater tuberosity. Electronically Signed   By: Donavan Foil  M.D.   On: 07/23/2022 23:52   DG Chest Portable 1 View  Result Date: 07/23/2022 CLINICAL DATA:  Fall EXAM: PORTABLE CHEST 1 VIEW COMPARISON:  02/06/2022, CT 10/01/2021 FINDINGS: Acute comminuted proximal right humerus fracture. No acute airspace disease or pleural effusion. Retrocardiac opacity corresponding to moderate to large hiatal hernia. Aortic atherosclerosis.  Multiple treated compression deformities of the spine. Postsurgical changes of the left breast. Old appearing left-sided rib fractures. Acute appearing right fifth, sixth and possibly seventh lateral rib fractures. IMPRESSION: 1. Acute comminuted proximal right humerus fracture. 2. Acute appearing right fifth, sixth and possibly seventh lateral rib fractures. 3. Hiatal hernia. 4. No acute airspace disease. Electronically Signed   By: Donavan Foil M.D.   On: 07/23/2022 23:50   DG Hip Unilat W or Wo Pelvis 2-3 Views Right  Result Date: 07/23/2022 CLINICAL DATA:  Fall EXAM: DG HIP (WITH OR WITHOUT PELVIS) 2-3V RIGHT COMPARISON:  11/24/2021 FINDINGS: SI joints are non widened. Pubic symphysis appears intact. Previous intramedullary rodding of the left femur with similar appearance of hardware. Old appearing deformity of the left inferior pubic ramus. Acute comminuted and displaced right intertrochanteric fracture. Right femoral head projects in joint. IMPRESSION: Acute comminuted and displaced right intertrochanteric fracture. Electronically Signed   By: Donavan Foil M.D.   On: 07/23/2022 23:46        Scheduled Meds:  amiodarone  200 mg Oral Daily   amLODipine  5 mg Oral Daily   enoxaparin (LOVENOX) injection  40 mg Subcutaneous Q24H   metoprolol tartrate  50 mg Oral BID   potassium chloride  40 mEq Oral Daily   povidone-iodine  2 Application Topical Once   Continuous Infusions:   LOS: 1 day    Time spent: 35 minutes    Barb Merino, MD [facility] Pager [phone]

## 2022-07-25 NOTE — Anesthesia Preprocedure Evaluation (Addendum)
Anesthesia Evaluation  Patient identified by MRN, date of birth, ID band Patient awake    Reviewed: Allergy & Precautions, NPO status , Patient's Chart, lab work & pertinent test results, reviewed documented beta blocker date and time   History of Anesthesia Complications Negative for: history of anesthetic complications  Airway Mallampati: III  TM Distance: >3 FB Neck ROM: Full    Dental  (+) Edentulous Lower, Edentulous Upper   Pulmonary sleep apnea , Current Smoker and Patient abstained from smoking., PE   Pulmonary exam normal        Cardiovascular hypertension, Pt. on medications and Pt. on home beta blockers + CAD, + Past MI, + Cardiac Stents, +CHF and + DVT  Normal cardiovascular exam+ dysrhythmias Atrial Fibrillation    '23 Cath - 1.Severe ostial/proximal right coronary artery disease, as detailed in Dr. Doug Sou diagnostic catheterization report from 02/22/2022. 2.Successful PCI to ostial/proximal RCA utilizing orbital atherectomy and intracoronary lithotripsy with placement of a Synergy 4.0 x 16 mm drug-eluting stent with 10% residual stenosis and TIMI-3 flow. 3.Successful placement of 50F temporary transvenous pacing wire via the right femoral vein; the pacing wire was removed at the end of the procedure.  '23 TTE - EF 45 to 50%. The left ventricle demonstrates regional wall motion abnormalities (basal RCA territory hypokinesis confirmed with regional strain imaging). There is severe asymmetric left ventricular hypertrophy of the basal-septal segment. Grade II diastolic dysfunction (pseudonormalization). Mildly increased right ventricular wall thickness. Left atrial size was moderately dilated. Right atrial size was moderately dilated. Trivial mitral valve regurgitation. Mild aortic valve stenosis. Aortic valve mean gradient measures 10.0 mmHg. Notable decrease in aortic valve excursion, study may understimate stenosis severity.       Neuro/Psych  Headaches PSYCHIATRIC DISORDERS Anxiety Depression   Dementia  Hx ICH  CVA    GI/Hepatic Neg liver ROS, hiatal hernia,GERD  Medicated and Controlled,,  Endo/Other  negative endocrine ROS    Renal/GU negative Renal ROS     Musculoskeletal  (+) Arthritis , Osteoarthritis,   Comminuted and displaced proximal humerus fracture Right fifth, sixth, and seventh lateral rib fractures  Right intertrochanter fracture of the femur    Abdominal   Peds  Hematology  On eliquis, plavix     Anesthesia Other Findings Raynaud's phenomenon  Reproductive/Obstetrics                             Anesthesia Physical Anesthesia Plan  ASA: 3  Anesthesia Plan: General   Post-op Pain Management: Ofirmev IV (intra-op)*   Induction: Intravenous  PONV Risk Score and Plan: 2 and Treatment may vary due to age or medical condition, Ondansetron and TIVA  Airway Management Planned: Oral ETT  Additional Equipment: None  Intra-op Plan:   Post-operative Plan: Extubation in OR  Informed Consent: I have reviewed the patients History and Physical, chart, labs and discussed the procedure including the risks, benefits and alternatives for the proposed anesthesia with the patient or authorized representative who has indicated his/her understanding and acceptance.     Dental advisory given  Plan Discussed with: CRNA and Anesthesiologist  Anesthesia Plan Comments:         Anesthesia Quick Evaluation

## 2022-07-26 ENCOUNTER — Encounter (HOSPITAL_COMMUNITY): Payer: Self-pay | Admitting: Orthopedic Surgery

## 2022-07-26 DIAGNOSIS — S72001A Fracture of unspecified part of neck of right femur, initial encounter for closed fracture: Secondary | ICD-10-CM | POA: Diagnosis not present

## 2022-07-26 LAB — CBC
HCT: 35.7 % — ABNORMAL LOW (ref 36.0–46.0)
Hemoglobin: 10.8 g/dL — ABNORMAL LOW (ref 12.0–15.0)
MCH: 26.1 pg (ref 26.0–34.0)
MCHC: 30.3 g/dL (ref 30.0–36.0)
MCV: 86.2 fL (ref 80.0–100.0)
Platelets: 494 10*3/uL — ABNORMAL HIGH (ref 150–400)
RBC: 4.14 MIL/uL (ref 3.87–5.11)
RDW: 15.3 % (ref 11.5–15.5)
WBC: 14.6 10*3/uL — ABNORMAL HIGH (ref 4.0–10.5)
nRBC: 0 % (ref 0.0–0.2)

## 2022-07-26 LAB — BASIC METABOLIC PANEL
Anion gap: 7 (ref 5–15)
BUN: 17 mg/dL (ref 8–23)
CO2: 28 mmol/L (ref 22–32)
Calcium: 8.5 mg/dL — ABNORMAL LOW (ref 8.9–10.3)
Chloride: 99 mmol/L (ref 98–111)
Creatinine, Ser: 0.78 mg/dL (ref 0.44–1.00)
GFR, Estimated: >60 mL/min (ref >60)
Glucose, Bld: 163 mg/dL — ABNORMAL HIGH (ref 70–99)
Potassium: 4.7 mmol/L (ref 3.5–5.1)
Sodium: 134 mmol/L — ABNORMAL LOW (ref 135–145)

## 2022-07-26 MED ORDER — SERTRALINE HCL 25 MG PO TABS
12.5000 mg | ORAL_TABLET | Freq: Every day | ORAL | Status: DC
  Administered 2022-07-26 – 2022-07-27 (×2): 12.5 mg via ORAL
  Filled 2022-07-26 (×2): qty 1

## 2022-07-26 MED ORDER — LORAZEPAM 0.5 MG PO TABS
0.5000 mg | ORAL_TABLET | Freq: Two times a day (BID) | ORAL | Status: DC | PRN
  Administered 2022-07-26 (×2): 0.5 mg via ORAL
  Filled 2022-07-26 (×2): qty 1

## 2022-07-26 MED ORDER — METHOCARBAMOL 500 MG PO TABS: 500.0000 mg | ORAL_TABLET | Freq: Three times a day (TID) | ORAL | Status: DC | PRN

## 2022-07-26 MED ORDER — APIXABAN 2.5 MG PO TABS
2.5000 mg | ORAL_TABLET | Freq: Two times a day (BID) | ORAL | Status: DC
  Administered 2022-07-26 – 2022-07-28 (×5): 2.5 mg via ORAL
  Filled 2022-07-26 (×6): qty 1

## 2022-07-26 MED ORDER — OXYCODONE HCL 5 MG PO TABS
5.0000 mg | ORAL_TABLET | ORAL | Status: DC | PRN
  Administered 2022-07-26 – 2022-07-28 (×7): 5 mg via ORAL
  Filled 2022-07-26 (×7): qty 1

## 2022-07-26 NOTE — Consult Note (Addendum)
   [facility] CM Inpatient Consult   07/26/2022  [name] [birth year] [id number]  [place] [facility] [ACO] Patient: [name]  Primary Care Provider:  Colon Branch, MD with [facility]  Patient screened for hospitalization with noted high risk score for unplanned readmission risk and  to assess for potential [place] service needs for post hospital transition for care coordination.  Review of patient's electronic medical record reveals patient is post op IM Nail of femoral   Plan:  Continue to follow progress and disposition to assess for post hospital community care coordination/management needs.  Referral request for community care coordination: awaiting PT/OT recommendations and post hospital needs which are currently undetermined.  07/27/22 11:40 am Met with patient at the bedside and updated notes for patient going to SNF.  She endorses her PCP and plan for transition to [facility] for rehab and to [facility].   Plan: Will update [facility] PAC RN if patient does go to a [facility] SNF affiilated facility.  Of note, [facility] Care Management/Population Health does not replace or interfere with any arrangements made by the Inpatient Transition of Care team.  For questions contact:   Natividad Brood, RN BSN [place]  [phone] business mobile phone Toll free office [phone]  *[place]  [phone] Fax number: [phone] [email or web address]    .

## 2022-07-26 NOTE — Progress Notes (Signed)
Subjective:  [name] is a 89 y.o. female, 1 Day Post-Op    s/p Procedure(s): INTRAMEDULLARY (IM) NAIL FEMORAL   Patient reports pain as moderate to severe.  Reports pain in both the right arm and leg. Reports some tingling in the feet, reports this started a few weeks ago, unsure why. Was having too much pain to do PT this morning. Denies SOB, CP. Denies calf pain.   Objective:   VITALS:   Vitals:   07/25/22 2016 07/26/22 0018 07/26/22 0419 07/26/22 0736  BP: (!) 120/46 130/60 (!) 114/56 (!) 119/58  Pulse: 64 64 61 61  Resp: '18 17 17 14  '$ Temp: 98.4 F (36.9 C) 98.4 F (36.9 C) 97.7 F (36.5 C) 98.3 F (36.8 C)  TempSrc: Oral Oral Oral Oral  SpO2: 94% 98% 91% 93%  Weight:      Height:       General :   NAD, resting in hospital bed.   Right upper extremity: Ecchymosis of the upper arm, neurovascularly intact with sensation over the deltoid and hands.  Able to make a fist.  Able to work on elbow range of motion without significant pain.  Tenderness with deep palpation or motion of the right shoulder.  Sling intact  Right hip: Pain with logroll, intact dressings, clean, dry.  Pain with logroll.  Able to move foot and ankle up and down.  Neurovascular intact distally.  Calf soft, supple, nontender.  Lab Results  Component Value Date   WBC 14.6 (H) 07/26/2022   HGB 10.8 (L) 07/26/2022   HCT 35.7 (L) 07/26/2022   MCV 86.2 07/26/2022   PLT 494 (H) 07/26/2022   BMET    Component Value Date/Time   NA 134 (L) 07/26/2022 0330   NA 139 03/22/2021 1453   NA 142 03/25/2017 1457   NA 139 04/10/2016 0855   K 4.7 07/26/2022 0330   K 4.1 03/25/2017 1457   K 4.2 04/10/2016 0855   CL 99 07/26/2022 0330   CL 96 (L) 03/25/2017 1457   CO2 28 07/26/2022 0330   CO2 32 03/25/2017 1457   CO2 29 04/10/2016 0855   GLUCOSE 163 (H) 07/26/2022 0330   GLUCOSE 109 03/25/2017 1457   BUN 17 07/26/2022 0330   BUN 9 03/22/2021 1453   BUN 7 03/25/2017 1457   BUN 10.0 04/10/2016  0855   CREATININE 0.78 07/26/2022 0330   CREATININE 0.97 06/20/2022 1154   CREATININE 0.55 (L) 04/22/2020 1444   CREATININE 0.8 04/10/2016 0855   CALCIUM 8.5 (L) 07/26/2022 0330   CALCIUM 9.8 03/25/2017 1457   CALCIUM 9.5 04/10/2016 0855   EGFR 66 03/22/2021 1453   GFRNONAA >60 07/26/2022 0330   GFRNONAA 56 (L) 06/20/2022 1154     Assessment/Plan: 1 Day Post-Op   Principal Problem:   Humerus fracture Active Problems:   ANXIETY DEPRESSION   Breast cancer (HCC)   Advance diet Up with therapy  Dispo: Pending PT eval, likely SNF.   Pain control: Oxycodone added she only had IV Dilaudid currently ordered., methocarbamol added PRN   Weightbearing Status:  - Right Lower Extremity: WBAT  -Right upper extremity: WBAT when using a walker otherwise, -  remain in sling, no active motion over shoulder height  Advised patient to work with PT to try and make small progress such as getting out of bed, standing next to the bed etc. , reports limited by pain earlier.   DVT Prophylaxis: per primary - pharmacy rec d/c lovenox,  restart baseline eliquis.    Faythe Casa 07/26/2022, 12:33 PM  Jonelle Sidle PA-C  Physician Assistant with Dr. Lillia Abed [facility]

## 2022-07-26 NOTE — Anesthesia Postprocedure Evaluation (Signed)
Anesthesia Post Note  Patient: [name]  Procedure(s) Performed: INTRAMEDULLARY (IM) NAIL FEMORAL (Right)     Patient location during evaluation: PACU Anesthesia Type: General Level of consciousness: awake and alert Pain management: pain level controlled Vital Signs Assessment: post-procedure vital signs reviewed and stable Respiratory status: spontaneous breathing, nonlabored ventilation and respiratory function stable Cardiovascular status: stable and blood pressure returned to baseline Anesthetic complications: no   No notable events documented.  Last Vitals:  Vitals:   07/26/22 0419 07/26/22 0736  BP: (!) 114/56 (!) 119/58  Pulse: 61 61  Resp: 17 14  Temp: 36.5 C 36.8 C  SpO2: 91% 93%    Last Pain:  Vitals:   07/26/22 0736  TempSrc: Oral  PainSc:                  Audry Pili

## 2022-07-26 NOTE — Progress Notes (Signed)
PROGRESS NOTE    [name]  [id number] DOB: 1929/06/01 DOA: 07/23/2022 PCP: Colon Branch, MD    Brief Narrative:  89 year old with history of coronary artery disease, Takotsubo cardiomyopathy, paroxysmal A-fib currently on Eliquis, history of stroke, coronary artery disease on Plavix who is currently at assisted living facility in [place] brought to the emergency room with mechanical fall. She lost her balance and fell on the floor. Skeletal survey consistent with comminuted and displaced proximal humerus fracture, right fifth sixth and seventh lateral rib fractures, right intertrochanter fracture of the femur. Admitted for symptom control and surgical intervention. Seen by orthopedics.    Assessment & Plan:   Closed traumatic right hip fracture:  Day 1 postop right hip IM nailing Dr. Stann Mainland.   Weightbearing as tolerated.   Resume Eliquis per DVT prophylaxis.   Start working with PT OT.  Adequate pain management.  Refer to SNF.    Right proximal humerus fracture: Sling, conservative management.  Pain medications.  Paroxysmal A-fib on Eliquis: Currently sinus rhythm.  Rate controlled on amiodarone and metoprolol.  Eliquis resumed today.   History of stroke and coronary artery disease: Currently stable.  Plavix on hold.  Dementia without behavioral disturbances: Long-term ALF resident.  She will need skilled nursing facility placement.  DVT prophylaxis: apixaban (ELIQUIS) tablet 2.5 mg Start: 07/26/22 1000 SCDs Start: 07/25/22 1803 apixaban (ELIQUIS) tablet 2.5 mg   Code Status: Full code Family Communication: Daughter Early Chars on the phone.  Disposition Plan: Status is: Inpatient Remains inpatient appropriate because: Inpatient surgery     Consultants:  Orthopedics  Procedures:  right hip ORIF.  Antimicrobials:  None   Subjective: Patient seen in the morning rounds. More alert and interactive today.  She wants her ativan. Right shoulder is more troublesome.    Objective: Vitals:   07/25/22 2016 07/26/22 0018 07/26/22 0419 07/26/22 0736  BP: (!) 120/46 130/60 (!) 114/56 (!) 119/58  Pulse: 64 64 61 61  Resp: '18 17 17 14  '$ Temp: 98.4 F (36.9 C) 98.4 F (36.9 C) 97.7 F (36.5 C) 98.3 F (36.8 C)  TempSrc: Oral Oral Oral Oral  SpO2: 94% 98% 91% 93%  Weight:      Height:        Intake/Output Summary (Last 24 hours) at 07/26/2022 1242 Last data filed at 07/26/2022 0423 Gross per 24 hour  Intake 957 ml  Output 775 ml  Net 182 ml    Filed Weights   07/23/22 2327 07/25/22 1425  Weight: 56.2 kg 57 kg    Examination:  General exam: Looks comfortable today. Frail and debilitated.  Not in any distress. Respiratory system: No added sounds. Cardiovascular system: S1 & S2 heard, RRR.  No edema.   Gastrointestinal system: Soft and nontender. Central nervous system: Alert and awake.  Oriented to herself and situation.  Right arm on sling.   Right leg lateral thigh incision clean and dry.    Data Reviewed: I have personally reviewed following labs and imaging studies  CBC: Recent Labs  Lab 07/23/22 2309 07/23/22 2344 07/24/22 0715 07/26/22 0330  WBC 9.6  --  13.5* 14.6*  NEUTROABS 7.8*  --  11.9*  --   HGB 13.3 14.6 12.1 10.8*  HCT 42.0 43.0 37.8 35.7*  MCV 84.5  --  83.1 86.2  PLT 493*  --  472* 494*    Basic Metabolic Panel: Recent Labs  Lab 07/23/22 2344 07/24/22 0715 07/26/22 0330  NA 135 136 134*  K 3.5 3.4*  4.7  CL 99 99 99  CO2  --  26 28  GLUCOSE 142* 160* 163*  BUN '15 12 17  '$ CREATININE 1.00 0.77 0.78  CALCIUM  --  8.7* 8.5*    GFR: Estimated Creatinine Clearance: 42.1 mL/min (by C-G formula based on SCr of 0.78 mg/dL). Liver Function Tests: No results for input(s): "AST", "ALT", "ALKPHOS", "BILITOT", "PROT", "ALBUMIN" in the last 168 hours. No results for input(s): "LIPASE", "AMYLASE" in the last 168 hours. No results for input(s): "AMMONIA" in the last 168 hours. Coagulation Profile: No results for  input(s): "INR", "PROTIME" in the last 168 hours. Cardiac Enzymes: No results for input(s): "CKTOTAL", "CKMB", "CKMBINDEX", "TROPONINI" in the last 168 hours. BNP (last 3 results) No results for input(s): "PROBNP" in the last 8760 hours. HbA1C: No results for input(s): "HGBA1C" in the last 72 hours. CBG: Recent Labs  Lab 07/24/22 1156  GLUCAP 143*    Lipid Profile: No results for input(s): "CHOL", "HDL", "LDLCALC", "TRIG", "CHOLHDL", "LDLDIRECT" in the last 72 hours. Thyroid Function Tests: No results for input(s): "TSH", "T4TOTAL", "FREET4", "T3FREE", "THYROIDAB" in the last 72 hours. Anemia Panel: No results for input(s): "VITAMINB12", "FOLATE", "FERRITIN", "TIBC", "IRON", "RETICCTPCT" in the last 72 hours. Sepsis Labs: No results for input(s): "PROCALCITON", "LATICACIDVEN" in the last 168 hours.  Recent Results (from the past 240 hour(s))  MRSA Next Gen by PCR, Nasal     Status: None   Collection Time: 07/24/22  8:23 PM   Specimen: Nasal Mucosa; Nasal Swab  Result Value Ref Range Status   MRSA by PCR Next Gen NOT DETECTED NOT DETECTED Final    Comment: (NOTE) The GeneXpert MRSA Assay (FDA approved for NASAL specimens only), is one component of a comprehensive MRSA colonization surveillance program. It is not intended to diagnose MRSA infection nor to guide or monitor treatment for MRSA infections. Test performance is not FDA approved in patients less than 60 years old. Performed at [facility], [place] [address], [place], [place] [address]          Radiology Studies: [place], [place] 2 VIEWS RIGHT  Result Date: 07/25/2022 CLINICAL DATA:  [id number] Surgery, elective [phone] EXAM: RIGHT FEMUR 2 VIEWS COMPARISON:  Radiograph 07/24/2022 FINDINGS: Intraoperative images during right proximal femur ORIF with cephalomedullary nail for an intertrochanteric fracture. Improved fracture alignment. No evidence of immediate complication. IMPRESSION: Intraoperative images during right  proximal femur ORIF. Improved fracture alignment. No evidence of immediate complication. Electronically Signed   By: Maurine Simmering M.D.   On: 07/25/2022 16:52   DG C-Arm 1-60 Min-No Report  Result Date: 07/25/2022 Fluoroscopy was utilized by the requesting physician.  No radiographic interpretation.        Scheduled Meds:  amiodarone  200 mg Oral Daily   amLODipine  5 mg Oral Daily   apixaban  2.5 mg Oral BID   docusate sodium  100 mg Oral BID   metoprolol tartrate  50 mg Oral BID   potassium chloride  40 mEq Oral Daily   sertraline  12.5 mg Oral QHS   Continuous Infusions:   LOS: 2 days    Time spent: 35 minutes    Barb Merino, MD [facility] Pager [phone]

## 2022-07-26 NOTE — Progress Notes (Signed)
Physical Therapy Evaluation Patient Details Name: [name] MRN: [id number] DOB: 1930/03/28 Today's Date: 07/26/2022  History of Present Illness  89 yo female was admitted 3/11 after a mechanical fall and found to have R hip intertrochanteric fracture, R proximal humeral fracture.  Pt is NWB RUE in sling unless using RW and may WBAT minimally on RUE just with walker, and WBAT on RLE.  PMHx: osteoporosis,T9,10,11, L1 and L2 compression fxs w/ Kyphoplasty, L hip fracture, OA, CVA, CAD, CA, HTN  Clinical Impression  Pt was seen for mobility in which PT assisted for rolling but with limited ability of pt to follow through.  Her plan is to get up to walk as tolerated and assist to chair toward recovery of independence with gait.  Her plan is also to go to SNF for around the clock care and assist with pain to increase her willingness to stand and sit OOB.  Pt is currently self limiting and nursing is working to keep pain managed for best possible outcome.  Follow acutely for goals of PT.       Recommendations for follow up therapy are one component of a multi-disciplinary discharge planning process, led by the attending physician.  Recommendations may be updated based on patient status, additional functional criteria and insurance authorization.  Follow Up Recommendations Skilled nursing-short term rehab (<3 hours/day) Can patient physically be transported by private vehicle: No    Assistance Recommended at Discharge Frequent or constant Supervision/Assistance  Patient can return home with the following  Two people to help with walking and/or transfers;Two people to help with bathing/dressing/bathroom;Assistance with cooking/housework;Direct supervision/assist for medications management;Direct supervision/assist for financial management;Assist for transportation;Help with stairs or ramp for entrance    Equipment Recommendations None recommended by PT  Recommendations for Other Services        Functional Status Assessment Patient has had a recent decline in their functional status and demonstrates the ability to make significant improvements in function in a reasonable and predictable amount of time.     Precautions / Restrictions Precautions Precautions: Fall Precaution Comments: NWB unless on walker RUE Required Braces or Orthoses: Sling Restrictions Weight Bearing Restrictions: Yes RUE Weight Bearing: Non weight bearing Other Position/Activity Restrictions: R NWB unless on walker, WBAT RLE      Mobility  Bed Mobility Overal bed mobility: Needs Assistance Bed Mobility: Rolling Rolling: Total assist         General bed mobility comments: pt is generally yelling and refusing to do any moving off bed although she can be assisted    Transfers                   General transfer comment: Refused, resisted    Ambulation/Gait                  Stairs            Wheelchair Mobility    Modified Rankin (Stroke Patients Only)       Balance                                             Pertinent Vitals/Pain Pain Assessment Pain Assessment: Faces Faces Pain Scale: Hurts worst Pain Location: any movement on RUE or RLE, actively resisting and screaming Pain Descriptors / Indicators: Grimacing, Guarding, Sharp, Operative site guarding Pain Intervention(s): Limited activity within patient's tolerance, Monitored during  session, Premedicated before session, Repositioned    Home Living Family/patient expects to be discharged to:: Unsure                   Additional Comments: [place] from [place] and doing her own care,    Prior Function Prior Level of Function : Needs assist       Physical Assist : Mobility (physical) Mobility (physical): Transfers;Gait   Mobility Comments: prev on RW to walk per pt       Hand Dominance   Dominant Hand: Right    Extremity/Trunk Assessment   Upper Extremity  Assessment Upper Extremity Assessment: RUE deficits/detail RUE Deficits / Details: arm in a sling and pt is reluctant to let PT try to move her RUE: Unable to fully assess due to pain RUE Coordination:  (unable to determine)            Communication   Communication: HOH  Cognition Arousal/Alertness: Awake/alert Behavior During Therapy: Agitated Overall Cognitive Status: No family/caregiver present to determine baseline cognitive functioning                                 General Comments: unclear how much pt is understanding the importance of therapy        General Comments General comments (skin integrity, edema, etc.): Pt was seen for mobility but was entirely from bed level.  Refuses to let PT assist with RLE, actively moves LLE normally.  Pt is fearful, allowed PT to reposition RUE after moving pillow for attempt to sit and not commenting about pain to return the pillow    Exercises     Assessment/Plan    PT Assessment Patient needs continued PT services  PT Problem List Decreased strength;Decreased range of motion;Decreased activity tolerance;Decreased balance;Decreased mobility;Decreased cognition;Decreased skin integrity;Pain       PT Treatment Interventions DME instruction;Gait training;Functional mobility training;Therapeutic activities;Therapeutic exercise;Balance training;Neuromuscular re-education;Patient/family education    PT Goals (Current goals can be found in the Care Plan section)  Acute Rehab PT Goals Patient Stated Goal: to have her pain go away first PT Goal Formulation: With patient/family Time For Goal Achievement: 08/09/22 Potential to Achieve Goals: Good    Frequency Min 2X/week     Co-evaluation               AM-PAC PT "6 Clicks" Mobility  Outcome Measure Help needed turning from your back to your side while in a flat bed without using bedrails?: Total Help needed moving from lying on your back to sitting on the side  of a flat bed without using bedrails?: Total Help needed moving to and from a bed to a chair (including a wheelchair)?: Total Help needed standing up from a chair using your arms (e.g., wheelchair or bedside chair)?: Total Help needed to walk in hospital room?: Total Help needed climbing 3-5 steps with a railing? : Total 6 Click Score: 6    End of Session   Activity Tolerance: Patient limited by pain;Other (comment);Treatment limited secondary to agitation (Pt is self limiting and likely over some confusion) Patient left: in bed;with bed alarm set;with call bell/phone within reach Nurse Communication: Mobility status;Patient requests pain meds;Other (comment) (Pt is refusing to let PT get up to side of bed) PT Visit Diagnosis: Muscle weakness (generalized) (M62.81);Pain Pain - Right/Left: Right Pain - part of body: Shoulder;Hip    Time: 1121-1140 PT Time Calculation (min) (ACUTE ONLY):  19 min   Charges:   PT Evaluation $PT Eval Moderate Complexity: 1 Mod         Ramond Dial 07/26/2022, 3:51 PM  Mee Hives, PT PhD Acute Rehab Dept. Number: [place] and [place]

## 2022-07-26 NOTE — Progress Notes (Signed)
ANTICOAGULATION CONSULT NOTE - Initial Consult  Pharmacy Consult for :  Restart PTA direct oral anticoagulant (DOAC) on POD 1 pending AM Hgb =or >9 and not requiring blood transfusion    Allergies  Allergen Reactions   Penicillins Hives, Rash and Other (See Comments)    Has patient had a PCN reaction causing immediate rash, facial/tongue/throat swelling, SOB or lightheadedness with hypotension: Yes Has patient had a PCN reaction causing severe rash involving mucus membranes or skin necrosis: yes Has patient had a PCN reaction that required hospitalization: No Has patient had a PCN reaction occurring within the last 10 years: no If all of the above answers are "NO", then may proceed with Cephalosporin use.  Other Reaction: OTHER REACTION   Valsartan Itching and Other (See Comments)   Atorvastatin Diarrhea   Carvedilol Other (See Comments)    "teeth hurt when I took it"    Ezetimibe Nausea Only   Fluvastatin Sodium Other (See Comments)    "Can't remember"   Magnesium Hydroxide Other (See Comments)    REACTION: triggers HAs   Meloxicam Other (See Comments)    Pt seeing auro's / spots.     Pneumovax [Pneumococcal Polysaccharide Vaccine] Other (See Comments)    Local reaction   Quinapril Hcl Other (See Comments)     "feelings of tiredness"   Simvastatin Diarrhea   Topamax [Topiramate] Itching   Vit D-Vit E-Safflower Oil Other (See Comments)    Headaches    Patient Measurements: Height: '5\' 6"'$  (167.6 cm) Weight: 57 kg (125 lb 10.6 oz) IBW/kg (Calculated) : 59.3   Vital Signs: Temp: 98.3 F (36.8 C) (03/14 0736) Temp Source: Oral (03/14 0736) BP: 119/58 (03/14 0736) Pulse Rate: 61 (03/14 0736)  Labs: Recent Labs    07/23/22 2309 07/23/22 2344 07/24/22 0715 07/26/22 0330  HGB 13.3 14.6 12.1 10.8*  HCT 42.0 43.0 37.8 35.7*  PLT 493*  --  472* 494*  CREATININE  --  1.00 0.77 0.78    Estimated Creatinine Clearance: 42.1 mL/min (by C-G formula based on SCr of 0.78  mg/dL).   Medical History: Past Medical History:  Diagnosis Date   CAD (coronary artery disease)    mild nonobstructive disease on cath in 2003   Cancer [facility])    Cardiomyopathy    Probable Takotsubo, severe CP w/ normal cath in 1994. Severe CP in 2003 w/ widespread T wave inversions on ECG. Cath w/ minimal coronary disease but LV-gram showed periapical severe hypokinesis and basilar hyperkinesia (EF 40%). Last echo in 4/09 confirmed full LV functional recovery with EF 60%, no regional wall motion abnormalities, mild to moderate LVH.   Chronic diastolic CHF (congestive heart failure) ([place]) 09/13/2019   CVA (cerebral infarction)    Depression with anxiety 03/29/2011   lost husband 3'09   DVT (deep venous thrombosis) ([place])    after venous ablation, R leg   Fracture 09/10/2007   L2, status post vertebroplasty of L2 performed by IR   GERD (gastroesophageal reflux disease)    Hemorrhoids    Hiatal hernia 03/29/2011   no nerve problems   Hyperlipidemia    Hyperplastic colon polyp 06/2001   Hypertension    Iron deficiency anemia due to chronic blood loss 12/30/2017   OA (osteoarthritis) of knee 03/29/2011   w/ bilateral knee pain-not a problem now   OSA (obstructive sleep apnea) 03/29/2011   no cpap used, not a problem now.   Osteoporosis    Otalgia of both ears    Dr. Simeon Craft  Polycythemia    Stroke ([place]) 03/29/2011   CVA x2 -last 10'12-?TIA(visual problems)   Varicose vein     Medications:  Medications Prior to Admission  Medication Sig Dispense Refill Last Dose   acetaminophen (TYLENOL) 325 MG tablet Take 650 mg by mouth every 6 (six) hours as needed for mild pain.   unk   alendronate (FOSAMAX) 70 MG tablet Take 1 tablet (70 mg total) by mouth once a week. Take with a full glass of water on an empty stomach. 12 tablet 3 unk   amiodarone (PACERONE) 200 MG tablet Take 200 mg by mouth daily.   unk   amLODipine (NORVASC) 5 MG tablet Take 1 tablet (5 mg total) by mouth daily. 90  tablet 1 unk   apixaban (ELIQUIS) 2.5 MG TABS tablet Take 1 tablet (2.5 mg total) by mouth 2 (two) times daily. (Patient taking differently: Take 2.5 mg by mouth daily.) 60 tablet  unk   clopidogrel (PLAVIX) 75 MG tablet Take 1 tablet (75 mg total) by mouth daily.   unk   furosemide (LASIX) 20 MG tablet Take 1 tablet (20 mg total) by mouth daily as needed for fluid or edema (for weigh gain of 3 pounds or more over one day). (Patient taking differently: Take 40 mg by mouth daily.) 45 tablet 1 unk   LORazepam (ATIVAN) 0.5 MG tablet Take 1 tablet (0.5 mg total) by mouth 2 (two) times daily as needed for anxiety. (Patient taking differently: Take 0.5 mg by mouth 2 (two) times daily.) 60 tablet 2 unk   metoprolol tartrate (LOPRESSOR) 50 MG tablet Take 1 tablet (50 mg total) by mouth 2 (two) times daily. 180 tablet 1 unk   nitroGLYCERIN (NITROSTAT) 0.4 MG SL tablet Place 1 tablet (0.4 mg total) under the tongue every 5 (five) minutes x 3 doses as needed for chest pain.  12    pantoprazole (PROTONIX) 40 MG tablet Take 1 tablet (40 mg total) by mouth at bedtime. (Patient taking differently: Take 40 mg by mouth daily.)   unk   rosuvastatin (CRESTOR) 20 MG tablet Take 1 tablet (20 mg total) by mouth daily. (Patient taking differently: Take 20 mg by mouth at bedtime.)   unk   sertraline (ZOLOFT) 25 MG tablet Take 12.5 mg by mouth at bedtime.   unk   ondansetron (ZOFRAN) 4 MG tablet Take 1 tablet (4 mg total) by mouth every 8 (eight) hours as needed for nausea or vomiting. (Patient not taking: Reported on 07/24/2022) 12 tablet 0 Not Taking    Assessment: 89 y.o female who was taking Apixaban2.5 mg BID for h/o CVA/DVT/PE prior to admission.  S/p IM nail femoral (right)  on 07/25/22.  Pharmacy consulted to resume DOAC on POD1 if hgb > 9 and no transfusion required.  POD #1 Hgb is 10.9, ok to resume PTA Eliquis.  PLTC wnl,  no bleeding reported. No transfusions ordered.  Goal of Therapy:  Stroke and DVT  prevention Monitor platelets by anticoagulation protocol: Yes   Plan:  Discontinue lovenox '40mg'$  q24hr.   Restart PTA Apixaban 2.5 mg BID. Pharmacy will sign off of consult.  Recommend to montior CBC and for signs/symptoms of bleeding.   Nicole Cella, RPh Clinical Pharmacist 07/26/2022,8:58 AM

## 2022-07-27 LAB — CBC
HCT: 33.7 % — ABNORMAL LOW (ref 36.0–46.0)
Hemoglobin: 10.5 g/dL — ABNORMAL LOW (ref 12.0–15.0)
MCH: 26.6 pg (ref 26.0–34.0)
MCHC: 31.2 g/dL (ref 30.0–36.0)
MCV: 85.3 fL (ref 80.0–100.0)
Platelets: 496 10*3/uL — ABNORMAL HIGH (ref 150–400)
RBC: 3.95 MIL/uL (ref 3.87–5.11)
RDW: 15.4 % (ref 11.5–15.5)
WBC: 12.8 10*3/uL — ABNORMAL HIGH (ref 4.0–10.5)
nRBC: 0 % (ref 0.0–0.2)

## 2022-07-27 MED ORDER — POLYETHYLENE GLYCOL 3350 17 G PO PACK
17.0000 g | PACK | Freq: Every day | ORAL | Status: DC
  Administered 2022-07-27 – 2022-07-28 (×2): 17 g via ORAL
  Filled 2022-07-27 (×2): qty 1

## 2022-07-27 MED ORDER — CALCIUM CARBONATE ANTACID 500 MG PO CHEW
1.0000 | CHEWABLE_TABLET | Freq: Three times a day (TID) | ORAL | Status: DC | PRN
  Administered 2022-07-27: 200 mg via ORAL
  Filled 2022-07-27: qty 1

## 2022-07-27 MED ORDER — SENNOSIDES-DOCUSATE SODIUM 8.6-50 MG PO TABS
1.0000 | ORAL_TABLET | Freq: Two times a day (BID) | ORAL | Status: DC
  Administered 2022-07-27 – 2022-07-28 (×3): 1 via ORAL
  Filled 2022-07-27 (×3): qty 1

## 2022-07-27 MED ORDER — DOCUSATE SODIUM 100 MG PO CAPS: 100.0000 mg | ORAL_CAPSULE | Freq: Two times a day (BID) | ORAL | 0 refills | Status: DC

## 2022-07-27 MED ORDER — HYDROCODONE-ACETAMINOPHEN 5-325 MG PO TABS: 1.0000 | ORAL_TABLET | Freq: Four times a day (QID) | ORAL | 0 refills | Status: DC | PRN

## 2022-07-27 MED ORDER — CALCIUM CARBONATE ANTACID 500 MG PO CHEW: 1.0000 | CHEWABLE_TABLET | Freq: Three times a day (TID) | ORAL | Status: DC

## 2022-07-27 MED ORDER — POLYETHYLENE GLYCOL 3350 17 G PO PACK: 17.0000 g | PACK | Freq: Every day | ORAL | 0 refills | Status: DC

## 2022-07-27 MED ORDER — LORAZEPAM 0.5 MG PO TABS: 0.5000 mg | ORAL_TABLET | Freq: Two times a day (BID) | ORAL | 0 refills | Status: AC | PRN

## 2022-07-27 NOTE — Progress Notes (Signed)
Subjective:  [name] is a 89 y.o. female, 2 Days Post-Op    s/p Procedure(s): INTRAMEDULLARY (IM) NAIL FEMORAL   Patient reports pain as moderate   Reports pain in the right arm is worse right now.  Reports she needs to pack up her other room she has. Reports she is still waiting on lunch, notified her nurse to help assist with this. Denies SOB, CP. Denies calf pain.   Objective:   VITALS:   Vitals:   07/26/22 2042 07/27/22 0431 07/27/22 0800 07/27/22 1000  BP: 121/60 (!) 121/52 (!) 118/59   Pulse: 69 64 66   Resp: 17 16 16    Temp: 98.6 F (37 C) 98.7 F (37.1 C) 98 F (36.7 C)   TempSrc:   Oral   SpO2: 95% (!) 88% (!) 88% 98%  Weight:      Height:       General :   NAD, resting in hospital bed.   Right upper extremity: Ecchymosis of the upper arm, neurovascularly intact with sensation over the deltoid and hands.  Able to make a fist.  Able to work on elbow range of motion without significant pain.  Tenderness with deep palpation or motion of the right shoulder.  Sling intact  Right hip: Pain with logroll, mild, intact dressings, clean, dry..  Able to move foot and ankle up and down.  Neurovascular intact distally.  Calf soft, supple, nontender.  Lab Results  Component Value Date   WBC 12.8 (H) 07/27/2022   HGB 10.5 (L) 07/27/2022   HCT 33.7 (L) 07/27/2022   MCV 85.3 07/27/2022   PLT 496 (H) 07/27/2022   BMET    Component Value Date/Time   NA 134 (L) 07/26/2022 0330   NA 139 03/22/2021 1453   NA 142 03/25/2017 1457   NA 139 04/10/2016 0855   K 4.7 07/26/2022 0330   K 4.1 03/25/2017 1457   K 4.2 04/10/2016 0855   CL 99 07/26/2022 0330   CL 96 (L) 03/25/2017 1457   CO2 28 07/26/2022 0330   CO2 32 03/25/2017 1457   CO2 29 04/10/2016 0855   GLUCOSE 163 (H) 07/26/2022 0330   GLUCOSE 109 03/25/2017 1457   BUN 17 07/26/2022 0330   BUN 9 03/22/2021 1453   BUN 7 03/25/2017 1457   BUN 10.0 04/10/2016 0855   CREATININE 0.78 07/26/2022 0330    CREATININE 0.97 06/20/2022 1154   CREATININE 0.55 (L) 04/22/2020 1444   CREATININE 0.8 04/10/2016 0855   CALCIUM 8.5 (L) 07/26/2022 0330   CALCIUM 9.8 03/25/2017 1457   CALCIUM 9.5 04/10/2016 0855   EGFR 66 03/22/2021 1453   GFRNONAA >60 07/26/2022 0330   GFRNONAA 56 (L) 06/20/2022 1154     Assessment/Plan: 2 Days Post-Op   Principal Problem:   Humerus fracture Active Problems:   ANXIETY DEPRESSION   Breast cancer ([place])   Advance diet Up with therapy  Dispo: SNF, ready for d/c from orthopedic perspective  Pain control: Oxycodone added she only had IV Dilaudid currently ordered., methocarbamol added PRN . Dr. Sloan Leiter printed hydrocodone for d/c.   Weightbearing Status:  - Right Lower Extremity: WBAT  -Right upper extremity: WBAT when using a walker otherwise, -  remain in sling, no active motion over shoulder height  Advised patient to work with PT to try and make small progress such as getting out of bed, standing next to the bed etc. , reports limited by pain earlier.   DVT Prophylaxis: per primary -  baseline Eliquis  Follow up at [facility] in 2 weeks for staple removal   Faythe Casa 07/27/2022, 2:04 PM  Jonelle Sidle PA-C  Physician Assistant with Dr. Lillia Abed [place]

## 2022-07-27 NOTE — Care Management Important Message (Signed)
Important Message  Patient Details  Name: [name] MRN: [id number] Date of Birth: 10-04-1929   Medicare Important Message Given:  Yes     Tucker Minter 07/27/2022, 1:11 PM

## 2022-07-27 NOTE — Progress Notes (Signed)
[place] auth received: [id number], valid 3/15-3/19. Per MD, plan for dc in the AM. Confirmed with Lexine Baton at [facility], they are prepared to admit pt tomorrow. SW will follow.  Wandra Feinstein, MSW, LCSW [phone] (coverage)

## 2022-07-27 NOTE — NC FL2 (Signed)
[place] LEVEL OF CARE FORM     IDENTIFICATION  Patient Name: [name] Birthdate: June 17, 1929 Sex: female Admission Date (Current Location): 07/23/2022  [facility] and [place] Number:  Herbalist and Address:  [facility] [place]. [facility], [place] [address], [place], [place] [address]      Provider Number: [id number]  Attending Physician Name and Address:  Barb Merino, MD  Relative Name and Phone Number:       Current Level of Care: Hospital Recommended Level of Care: [place] Prior Approval Number:    Date Approved/Denied:   PASRR Number: [id number]  Discharge Plan: SNF    Current Diagnoses: Patient Active Problem List   Diagnosis Date Noted   Humerus fracture 07/24/2022   Presence of drug-eluting stent in right coronary artery    Dilutional hyponatremia 02/25/2022   ST elevation myocardial infarction involving right coronary artery (HCC)    Paroxysmal atrial fibrillation ([place])    Closed right radial fracture 11/25/2021   Protein-calorie malnutrition, severe 10/09/2021   Acute encephalopathy 10/07/2021   Compression fracture of T7 vertebra  10/07/2021   Rib pain on right side 01/24/2021   PE (pulmonary thromboembolism) ([place]) 12/25/2020   Lumbar compression fracture ([place]) 11/16/2020   GERD (gastroesophageal reflux disease)    DVT (deep venous thrombosis) ([place])    Diverticulitis    Coronary artery disease involving native coronary artery of native heart with unstable angina pectoris (HCC)    Acute midline thoracic back pain 03/04/2020   Closed left hip fracture ([place]) 12/24/2019   ICH (intracerebral hemorrhage) ([place]) 11/04/2019   Intractable back pain 09/13/2019   Compression fracture of T9 vertebra ([place]) 09/13/2019   Chronic diastolic CHF (congestive heart failure) ([place]) 09/13/2019   Overweight (BMI 25.0-29.9) 09/13/2019   Constipation 09/13/2019   Raynaud's phenomenon without gangrene 05/19/2018   Iron  deficiency anemia due to chronic blood loss 12/30/2017   Insomnia 06/25/2017   Mild CAD 05/24/2017   Superficial thrombophlebitis 04/06/2014   Breast cancer ([place]) 11/05/2011   Polycythemia 11/05/2011   Stroke ([place]) 03/29/2011   OSA (obstructive sleep apnea) 03/29/2011   OA (osteoarthritis) of knee 03/29/2011   Hiatal hernia 03/29/2011   E. coli gastroenteritis 03/29/2011   Depression with anxiety 03/29/2011   TIA (transient ischemic attack) 01/16/2011   CARDIOMYOPATHY 08/03/2009   DJD -- pain mgmt 08/16/2008   GAIT DISTURBANCE 10/21/2007   ANXIETY DEPRESSION 10/08/2007   Hyperlipidemia with target LDL less than 70 06/25/2007   Takotsubo cardiomyopathy 06/25/2007   GERD without esophagitis 06/25/2007   Essential hypertension 09/30/2006   Migraine with aura 09/30/2006   Hyperplastic colon polyp 06/2001    [place] RESPIRATION BLADDER Height & Weight     Self, Time, Situation, Place  O2 Continent Weight: 125 lb 10.6 oz (57 kg) Height:  5\' 6"  (167.6 cm)  BEHAVIORAL SYMPTOMS/MOOD NEUROLOGICAL BOWEL NUTRITION STATUS      Continent    AMBULATORY STATUS COMMUNICATION OF NEEDS Skin   Extensive Assist Verbally Surgical wounds                       Personal Care Assistance Level of Assistance  Bathing, Feeding, Dressing Bathing Assistance: Maximum assistance Feeding assistance: Maximum assistance Dressing Assistance: Maximum assistance     Functional Limitations Info  Sight, Hearing, Speech Sight Info: Impaired Hearing Info: Impaired Speech Info: Adequate    SPECIAL CARE FACTORS FREQUENCY  PT (By licensed PT), OT (By licensed OT)  Contractures Contractures Info: Not present    Additional Factors Info                  Current Medications (07/27/2022):  This is the current hospital active medication list Current Facility-Administered Medications  Medication Dose Route Frequency Provider Last Rate Last Admin   acetaminophen  (TYLENOL) tablet 650 mg  650 mg Oral Q6H PRN Nicholes Stairs, MD   650 mg at 07/26/22 [id number]   Or   acetaminophen (TYLENOL) suppository 650 mg  650 mg Rectal Q6H PRN Nicholes Stairs, MD       amiodarone (PACERONE) tablet 200 mg  200 mg Oral Daily Nicholes Stairs, MD   200 mg at 07/26/22 0854   amLODipine (NORVASC) tablet 5 mg  5 mg Oral Daily Nicholes Stairs, MD   5 mg at 07/26/22 0854   apixaban (ELIQUIS) tablet 2.5 mg  2.5 mg Oral BID Wendee Beavers, RPH   2.5 mg at 07/26/22 2146   docusate sodium (COLACE) capsule 100 mg  100 mg Oral BID Nicholes Stairs, MD   100 mg at 07/26/22 2147   HYDROmorphone (DILAUDID) injection 1 mg  1 mg Intravenous Q4H PRN Nicholes Stairs, MD   1 mg at 07/27/22 [id number]   LORazepam (ATIVAN) tablet 0.5 mg  0.5 mg Oral BID PRN Barb Merino, MD   0.5 mg at 07/26/22 2156   menthol-cetylpyridinium (CEPACOL) lozenge 3 mg  1 lozenge Oral PRN Nicholes Stairs, MD   3 mg at 07/26/22 0522   Or   phenol (CHLORASEPTIC) mouth spray 1 spray  1 spray Mouth/Throat PRN Nicholes Stairs, MD       methocarbamol (ROBAXIN) tablet 500 mg  500 mg Oral Q8H PRN Thereasa Solo, Kevan D, PA       metoCLOPramide (REGLAN) tablet 5-10 mg  5-10 mg Oral Q8H PRN Nicholes Stairs, MD       Or   metoCLOPramide (REGLAN) injection 5-10 mg  5-10 mg Intravenous Q8H PRN Nicholes Stairs, MD   10 mg at 07/26/22 1222   metoprolol tartrate (LOPRESSOR) tablet 50 mg  50 mg Oral BID Nicholes Stairs, MD   50 mg at 07/26/22 2147   nitroGLYCERIN (NITROSTAT) SL tablet 0.4 mg  0.4 mg Sublingual Q5 Min x 3 PRN Nicholes Stairs, MD       ondansetron [facility]) tablet 4 mg  4 mg Oral Q6H PRN Nicholes Stairs, MD       Or   ondansetron [facility]) injection 4 mg  4 mg Intravenous Q6H PRN Nicholes Stairs, MD       Oral care mouth rinse  15 mL Mouth Rinse PRN Nicholes Stairs, MD       oxyCODONE (Oxy IR/ROXICODONE) immediate release tablet 5 mg  5 mg Oral  Q4H PRN McClung, Achilles Dunk D, PA   5 mg at 07/27/22 [id number]   polyethylene glycol (MIRALAX / GLYCOLAX) packet 17 g  17 g Oral Daily Ghimire, Dante Gang, MD       potassium chloride SA (KLOR-CON M) CR tablet 40 mEq  40 mEq Oral Daily Nicholes Stairs, MD   40 mEq at 07/26/22 [id number]   senna-docusate (Senokot-S) tablet 1 tablet  1 tablet Oral QHS PRN Nicholes Stairs, MD       senna-docusate (Senokot-S) tablet 1 tablet  1 tablet Oral BID Barb Merino, MD       sertraline (ZOLOFT) tablet 12.5 mg  12.5 mg Oral  Jeannine Boga, MD   12.5 mg at 07/26/22 2146     Discharge Medications: Please see discharge summary for a list of discharge medications.  Relevant Imaging Results:  Relevant Lab Results:   Additional Information SSN [id number]  Wandra Feinstein [place], [place]

## 2022-07-27 NOTE — Progress Notes (Signed)
Physical Therapy Treatment Patient Details Name: [name] MRN: [id number] DOB: [birth year] Today's Date: 07/27/2022   History of Present Illness 89 yo female was admitted 3/11 after a mechanical fall and found to have R hip intertrochanteric fracture, R proximal humeral fracture.  Pt is NWB RUE in sling unless using RW and may WBAT minimally on RUE just with walker, and WBAT on RLE.  PMHx: osteoporosis,T9,10,11, L1 and L2 compression fxs w/ Kyphoplasty, L hip fracture, OA, CVA, CAD, CA, HTN    PT Comments    Pt was seen for mobility on side of bed to chair, with gait belt and two person support of the task.  Pt did a pivot transfer as she is very protective on RUE, and was given appropriate support in chair of all injuries.  Pt is calm once there, but does get very upset when moving despite her willingness to try.  Will focus on giving her choices of moving and what amount, and making her comfortable with pain meds first.  Follow along with her as her plan of care outlines.   Recommendations for follow up therapy are one component of a multi-disciplinary discharge planning process, led by the attending physician.  Recommendations may be updated based on patient status, additional functional criteria and insurance authorization.  Follow Up Recommendations  Skilled nursing-short term rehab (<3 hours/day) Can patient physically be transported by private vehicle: No   Assistance Recommended at Discharge Frequent or constant Supervision/Assistance  Patient can return home with the following Two people to help with walking and/or transfers;Two people to help with bathing/dressing/bathroom;Assistance with cooking/housework;Direct supervision/assist for medications management;Direct supervision/assist for financial management;Assist for transportation;Help with stairs or ramp for entrance   Equipment Recommendations  None recommended by PT    Recommendations for Other Services        Precautions / Restrictions Precautions Precautions: Fall Precaution Comments: NWB unless on walker RUE Required Braces or Orthoses: Sling Restrictions Weight Bearing Restrictions: Yes RUE Weight Bearing: Non weight bearing Other Position/Activity Restrictions: R NWB unless on walker, WBAT RLE     Mobility  Bed Mobility Overal bed mobility: Needs Assistance Bed Mobility: Rolling, Supine to Sit Rolling: Mod assist   Supine to sit: Mod assist, Max assist     General bed mobility comments: Pt is reluctant but assisted to move LE's with some encouragement and cues, used bed pad to assist her    Transfers Overall transfer level: Needs assistance Equipment used: Rolling walker (2 wheels), 1 person hand held assist Transfers: Sit to/from Stand Sit to Stand: Max assist, +2 safety/equipment           General transfer comment: Pt was assisted with gait belt and CNA to assist with lines and chair    Ambulation/Gait               General Gait Details: deferred   Stairs             Wheelchair Mobility    Modified Rankin (Stroke Patients Only)       Balance Overall balance assessment: Needs assistance Sitting-balance support: Feet supported, Single extremity supported Sitting balance-Leahy Scale: Fair     Standing balance support: Single extremity supported, During functional activity Standing balance-Leahy Scale: Poor                              Cognition Arousal/Alertness: Awake/alert Behavior During Therapy: WFL for tasks assessed/performed Overall Cognitive Status: Impaired/Different from  baseline Area of Impairment: Problem solving, Awareness, Safety/judgement, Following commands, Attention                   Current Attention Level: Selective   Following Commands: Follows one step commands with increased time Safety/Judgement: Decreased awareness of safety, Decreased awareness of deficits Awareness: Intellectual Problem  Solving: Slow processing, Requires verbal cues General Comments: general reluctance then acceptance of therapy session        Exercises      General Comments General comments (skin integrity, edema, etc.): Pt is up to side of bed with help, able to use bed pad to scoot to final EOB location and used gait belt to pivot to chair      Pertinent Vitals/Pain Pain Assessment Pain Assessment: Faces Faces Pain Scale: Hurts even more Pain Location: RUE to move and RLE to stand Pain Descriptors / Indicators: Guarding, Grimacing Pain Intervention(s): Monitored during session, Repositioned, Patient requesting pain meds-RN notified, Premedicated before session, Limited activity within patient's tolerance    Home Living                          Prior Function            PT Goals (current goals can now be found in the care plan section) Acute Rehab PT Goals Patient Stated Goal: get rid of the pain Progress towards PT goals: Progressing toward goals    Frequency    Min 3X/week      PT Plan Frequency needs to be updated    Co-evaluation              AM-PAC PT "6 Clicks" Mobility   Outcome Measure  Help needed turning from your back to your side while in a flat bed without using bedrails?: A Lot Help needed moving from lying on your back to sitting on the side of a flat bed without using bedrails?: A Lot Help needed moving to and from a bed to a chair (including a wheelchair)?: A Lot Help needed standing up from a chair using your arms (e.g., wheelchair or bedside chair)?: Total Help needed to walk in hospital room?: Total Help needed climbing 3-5 steps with a railing? : Total 6 Click Score: 9    End of Session Equipment Utilized During Treatment: Gait belt;Oxygen Activity Tolerance: Patient tolerated treatment well Patient left: in chair;with call bell/phone within reach;with chair alarm set;with nursing/sitter in room Nurse Communication: Mobility  status;Patient requests pain meds PT Visit Diagnosis: Muscle weakness (generalized) (M62.81);Pain Pain - Right/Left: Right Pain - part of body: Shoulder;Hip     Time: 1030-1049 PT Time Calculation (min) (ACUTE ONLY): 19 min  Charges:  $Therapeutic Activity: 8-22 mins             Ramond Dial 07/27/2022, 12:33 PM  Mee Hives, PT PhD Acute Rehab Dept. Number: [place] [facility] [place]

## 2022-07-27 NOTE — Progress Notes (Signed)
Physical Therapy Treatment Patient Details Name: [name] MRN: [id number] DOB: 20-Jun-1929 Today's Date: 07/27/2022   History of Present Illness 89 yo female was admitted 3/11 after a mechanical fall and found to have R hip intertrochanteric fracture, R proximal humeral fracture.  Pt is NWB RUE in sling unless using RW and may WBAT minimally on RUE just with walker, and WBAT on RLE.  PMHx: osteoporosis,T9,10,11, L1 and L2 compression fxs w/ Kyphoplasty, L hip fracture, OA, CVA, CAD, CA, HTN    PT Comments    Pt was seen for a second visit to work on completion of transfers and staff education for mobility to side of bed.  Pt is initially asking to stand up with no help, but finally required direct assist of gait belt to get to the bed with a pivot transfer.  Focus on her ability to assist with movement and try to give her opportunities to initiate more, as well as managing pain as best possible to elicit more cooperation with getting up and back to bed.  Progress to standing on walker if pt is able next session, and work with her on increased time OOB in chair as tolerated.  Still recommending SNF care as pt is fairly dependent to initiate moving and unsafe to attempt alone.   Recommendations for follow up therapy are one component of a multi-disciplinary discharge planning process, led by the attending physician.  Recommendations may be updated based on patient status, additional functional criteria and insurance authorization.  Follow Up Recommendations  Skilled nursing-short term rehab (<3 hours/day) Can patient physically be transported by private vehicle: No   Assistance Recommended at Discharge Frequent or constant Supervision/Assistance  Patient can return home with the following Two people to help with walking and/or transfers;Two people to help with bathing/dressing/bathroom;Assistance with cooking/housework;Direct supervision/assist for medications management;Direct  supervision/assist for financial management;Assist for transportation;Help with stairs or ramp for entrance   Equipment Recommendations  None recommended by PT    Recommendations for Other Services       Precautions / Restrictions Precautions Precautions: Fall Precaution Comments: NWB unless on walker RUE Required Braces or Orthoses: Sling Restrictions Weight Bearing Restrictions: Yes RUE Weight Bearing: Non weight bearing Other Position/Activity Restrictions: R NWB unless on walker, WBAT RLE     Mobility  Bed Mobility Overal bed mobility: Needs Assistance Bed Mobility: Sit to Supine, Rolling Rolling: Mod assist   Supine to sit: Mod assist, Max assist Sit to supine: Mod assist   General bed mobility comments: Pt is reluctant but assisted to move LE's with some encouragement and cues, used bed pad to assist her    Transfers Overall transfer level: Needs assistance Equipment used: 1 person hand held assist Transfers: Sit to/from Stand, Bed to chair/wheelchair/BSC Sit to Stand: Max assist, +2 safety/equipment Stand pivot transfers: Mod assist, +2 safety/equipment         General transfer comment: gait belt and help for lines    Ambulation/Gait               General Gait Details: deferred   Stairs             Wheelchair Mobility    Modified Rankin (Stroke Patients Only)       Balance Overall balance assessment: Needs assistance Sitting-balance support: Single extremity supported, Feet supported Sitting balance-Leahy Scale: Fair     Standing balance support: Single extremity supported Standing balance-Leahy Scale: Poor  Cognition Arousal/Alertness: Awake/alert Behavior During Therapy: Anxious Overall Cognitive Status: Impaired/Different from baseline Area of Impairment: Safety/judgement, Attention, Following commands, Problem solving, Awareness                   Current Attention Level:  Selective   Following Commands: Follows one step commands with increased time Safety/Judgement: Decreased awareness of deficits Awareness: Intellectual Problem Solving: Slow processing General Comments: with help and cues was able to get to bed from chair after first insisting she could stand alone        Exercises      General Comments General comments (skin integrity, edema, etc.): pt is on chair initially insisting she can stand given time and finally did assist her to get to bed with gait belt      Pertinent Vitals/Pain Pain Assessment Pain Assessment: Faces Faces Pain Scale: Hurts even more Pain Location: R hip with all movement Pain Descriptors / Indicators: Guarding, Grimacing, Discomfort Pain Intervention(s): Limited activity within patient's tolerance, Monitored during session, Premedicated before session, Repositioned    Home Living                          Prior Function            PT Goals (current goals can now be found in the care plan section) Acute Rehab PT Goals Patient Stated Goal: to hurt less Progress towards PT goals: Progressing toward goals    Frequency    Min 3X/week      PT Plan Current plan remains appropriate    Co-evaluation              AM-PAC PT "6 Clicks" Mobility   Outcome Measure  Help needed turning from your back to your side while in a flat bed without using bedrails?: A Lot Help needed moving from lying on your back to sitting on the side of a flat bed without using bedrails?: A Lot Help needed moving to and from a bed to a chair (including a wheelchair)?: A Lot Help needed standing up from a chair using your arms (e.g., wheelchair or bedside chair)?: A Lot Help needed to walk in hospital room?: Total Help needed climbing 3-5 steps with a railing? : Total 6 Click Score: 10    End of Session Equipment Utilized During Treatment: Gait belt;Oxygen Activity Tolerance: Patient tolerated treatment  well Patient left: in bed;with call bell/phone within reach;with bed alarm set Nurse Communication: Mobility status;Patient requests pain meds PT Visit Diagnosis: Muscle weakness (generalized) (M62.81);Pain Pain - Right/Left: Right Pain - part of body: Shoulder;Hip     Time: [id number] PT Time Calculation (min) (ACUTE ONLY): 15 min  Charges:  $Therapeutic Activity: 8-22 mins        Ramond Dial 07/27/2022, 3:39 PM  Mee Hives, PT PhD Acute Rehab Dept. Number: [place] [facility] [place]

## 2022-07-27 NOTE — Progress Notes (Signed)
PROGRESS NOTE    [name]  [id number] DOB: 07/27/1929 DOA: 07/23/2022 PCP: Colon Branch, MD    Brief Narrative:  89 year old with history of coronary artery disease, Takotsubo cardiomyopathy, paroxysmal A-fib currently on Eliquis, history of stroke, coronary artery disease on Plavix who is currently at assisted living facility in [place] brought to the emergency room with mechanical fall. She lost her balance and fell on the floor. Skeletal survey consistent with comminuted and displaced proximal humerus fracture, right fifth sixth and seventh lateral rib fractures, right intertrochanter fracture of the femur. Admitted for symptom control and surgical intervention. Seen by orthopedics.    Assessment & Plan:   Closed traumatic right hip fracture:  Day 2 postop right hip IM nailing Dr. Stann Mainland.   Weightbearing as tolerated.   Resumed Eliquis for DVT prophylaxis.   working with PT OT.   Refer to SNF.   Pain management with oral pain medications along with laxatives.  Right proximal humerus fracture: Sling, conservative management.  Pain medications.  Paroxysmal A-fib on Eliquis: Currently sinus rhythm.  Rate controlled on amiodarone and metoprolol.  Eliquis therapeutic.  History of stroke and coronary artery disease: Currently stable.  Recent cardiac cath.  Resume Plavix.  Blood loss anemia: Expected from long bone fractures and surgery.  Stable.  Dementia without behavioral disturbances: Long-term ALF resident.  She will need skilled nursing facility placement.  DVT prophylaxis: apixaban (ELIQUIS) tablet 2.5 mg Start: 07/26/22 1000 SCDs Start: 07/25/22 1803 apixaban (ELIQUIS) tablet 2.5 mg   Code Status: Full code Family Communication: None today. Disposition Plan: Status is: Inpatient Remains inpatient appropriate because: Inpatient surgery     Consultants:  Orthopedics  Procedures:  right hip ORIF.  Antimicrobials:  None   Subjective: Patient seen and  examined.  Right shoulder has discomfort but otherwise feels comfortable.  She is interested in going to [place].    Objective: Vitals:   07/26/22 2042 07/27/22 0431 07/27/22 0800 07/27/22 1000  BP: 121/60 (!) 121/52 (!) 118/59   Pulse: 69 64 66   Resp: 17 16 16    Temp: 98.6 F (37 C) 98.7 F (37.1 C) 98 F (36.7 C)   TempSrc:   Oral   SpO2: 95% (!) 88% (!) 88% 98%  Weight:      Height:        Intake/Output Summary (Last 24 hours) at 07/27/2022 1314 Last data filed at 07/27/2022 1100 Gross per 24 hour  Intake --  Output 1150 ml  Net -1150 ml    Filed Weights   07/23/22 2327 07/25/22 1425  Weight: 56.2 kg 57 kg    Examination:  General exam: Looks comfortable today.  Appropriately anxious. Respiratory system: No added sounds. Cardiovascular system: S1 & S2 heard, RRR.  No edema.   Gastrointestinal system: Soft and nontender. Central nervous system: Alert and awake.  Oriented to herself and situation.  Right arm on sling.   Right leg lateral thigh incision clean and dry.    Data Reviewed: I have personally reviewed following labs and imaging studies  CBC: Recent Labs  Lab 07/23/22 2309 07/23/22 2344 07/24/22 0715 07/26/22 0330 07/27/22 0601  WBC 9.6  --  13.5* 14.6* 12.8*  NEUTROABS 7.8*  --  11.9*  --   --   HGB 13.3 14.6 12.1 10.8* 10.5*  HCT 42.0 43.0 37.8 35.7* 33.7*  MCV 84.5  --  83.1 86.2 85.3  PLT 493*  --  472* 494* 496*    Basic Metabolic Panel:  Recent Labs  Lab 07/23/22 2344 07/24/22 0715 07/26/22 0330  NA 135 136 134*  K 3.5 3.4* 4.7  CL 99 99 99  CO2  --  26 28  GLUCOSE 142* 160* 163*  BUN 15 12 17   CREATININE 1.00 0.77 0.78  CALCIUM  --  8.7* 8.5*    GFR: Estimated Creatinine Clearance: 42.1 mL/min (by C-G formula based on SCr of 0.78 mg/dL). Liver Function Tests: No results for input(s): "AST", "ALT", "ALKPHOS", "BILITOT", "PROT", "ALBUMIN" in the last 168 hours. No results for input(s): "LIPASE", "AMYLASE" in the last 168  hours. No results for input(s): "AMMONIA" in the last 168 hours. Coagulation Profile: No results for input(s): "INR", "PROTIME" in the last 168 hours. Cardiac Enzymes: No results for input(s): "CKTOTAL", "CKMB", "CKMBINDEX", "TROPONINI" in the last 168 hours. BNP (last 3 results) No results for input(s): "PROBNP" in the last 8760 hours. HbA1C: No results for input(s): "HGBA1C" in the last 72 hours. CBG: Recent Labs  Lab 07/24/22 1156  GLUCAP 143*    Lipid Profile: No results for input(s): "CHOL", "HDL", "LDLCALC", "TRIG", "CHOLHDL", "LDLDIRECT" in the last 72 hours. Thyroid Function Tests: No results for input(s): "TSH", "T4TOTAL", "FREET4", "T3FREE", "THYROIDAB" in the last 72 hours. Anemia Panel: No results for input(s): "VITAMINB12", "FOLATE", "FERRITIN", "TIBC", "IRON", "RETICCTPCT" in the last 72 hours. Sepsis Labs: No results for input(s): "PROCALCITON", "LATICACIDVEN" in the last 168 hours.  Recent Results (from the past 240 hour(s))  MRSA Next Gen by PCR, Nasal     Status: None   Collection Time: 07/24/22  8:23 PM   Specimen: Nasal Mucosa; Nasal Swab  Result Value Ref Range Status   MRSA by PCR Next Gen NOT DETECTED NOT DETECTED Final    Comment: (NOTE) The GeneXpert MRSA Assay (FDA approved for NASAL specimens only), is one component of a comprehensive MRSA colonization surveillance program. It is not intended to diagnose MRSA infection nor to guide or monitor treatment for MRSA infections. Test performance is not FDA approved in patients less than 71 years old. Performed at [facility], [place] [address] [place], [place] [address]          Radiology Studies: [place], [place] 2 VIEWS RIGHT  Result Date: 07/25/2022 CLINICAL DATA:  [id number] Surgery, elective [phone] EXAM: RIGHT FEMUR 2 VIEWS COMPARISON:  Radiograph 07/24/2022 FINDINGS: Intraoperative images during right proximal femur ORIF with cephalomedullary nail for an intertrochanteric fracture. Improved  fracture alignment. No evidence of immediate complication. IMPRESSION: Intraoperative images during right proximal femur ORIF. Improved fracture alignment. No evidence of immediate complication. Electronically Signed   By: Maurine Simmering M.D.   On: 07/25/2022 16:52   DG C-Arm 1-60 Min-No Report  Result Date: 07/25/2022 Fluoroscopy was utilized by the requesting physician.  No radiographic interpretation.        Scheduled Meds:  amiodarone  200 mg Oral Daily   amLODipine  5 mg Oral Daily   apixaban  2.5 mg Oral BID   docusate sodium  100 mg Oral BID   metoprolol tartrate  50 mg Oral BID   polyethylene glycol  17 g Oral Daily   senna-docusate  1 tablet Oral BID   sertraline  12.5 mg Oral QHS   Continuous Infusions:   LOS: 3 days    Time spent: 35 minutes    Barb Merino, MD [facility] Pager [phone]

## 2022-07-27 NOTE — Progress Notes (Signed)
Pt and pt's dtr agreeable to recommendation for SNF and request [employer]. Bed offer received from [facility], pt/family have accepted.   [employer] Josem Kaufmann is pending at this time. Confirmed [facility] is able to accept pt when [place] received including weekend.   SW will provide updates as available.   Wandra Feinstein, MSW, LCSW [phone] (coverage)

## 2022-07-28 DIAGNOSIS — I480 Paroxysmal atrial fibrillation: Secondary | ICD-10-CM | POA: Diagnosis not present

## 2022-07-28 DIAGNOSIS — R2689 Other abnormalities of gait and mobility: Secondary | ICD-10-CM | POA: Diagnosis not present

## 2022-07-28 DIAGNOSIS — I5020 Unspecified systolic (congestive) heart failure: Secondary | ICD-10-CM | POA: Diagnosis not present

## 2022-07-28 DIAGNOSIS — I2699 Other pulmonary embolism without acute cor pulmonale: Secondary | ICD-10-CM | POA: Diagnosis not present

## 2022-07-28 DIAGNOSIS — I2111 ST elevation (STEMI) myocardial infarction involving right coronary artery: Secondary | ICD-10-CM | POA: Diagnosis not present

## 2022-07-28 DIAGNOSIS — R2681 Unsteadiness on feet: Secondary | ICD-10-CM | POA: Diagnosis not present

## 2022-07-28 DIAGNOSIS — I69828 Other speech and language deficits following other cerebrovascular disease: Secondary | ICD-10-CM | POA: Diagnosis not present

## 2022-07-28 DIAGNOSIS — E08319 Diabetes mellitus due to underlying condition with unspecified diabetic retinopathy without macular edema: Secondary | ICD-10-CM | POA: Diagnosis not present

## 2022-07-28 DIAGNOSIS — I2510 Atherosclerotic heart disease of native coronary artery without angina pectoris: Secondary | ICD-10-CM | POA: Diagnosis not present

## 2022-07-28 DIAGNOSIS — I10 Essential (primary) hypertension: Secondary | ICD-10-CM | POA: Diagnosis not present

## 2022-07-28 DIAGNOSIS — I7300 Raynaud's syndrome without gangrene: Secondary | ICD-10-CM | POA: Diagnosis not present

## 2022-07-28 DIAGNOSIS — D500 Iron deficiency anemia secondary to blood loss (chronic): Secondary | ICD-10-CM | POA: Diagnosis not present

## 2022-07-28 DIAGNOSIS — M6281 Muscle weakness (generalized): Secondary | ICD-10-CM | POA: Diagnosis not present

## 2022-07-28 DIAGNOSIS — E559 Vitamin D deficiency, unspecified: Secondary | ICD-10-CM | POA: Diagnosis not present

## 2022-07-28 DIAGNOSIS — I428 Other cardiomyopathies: Secondary | ICD-10-CM | POA: Diagnosis not present

## 2022-07-28 DIAGNOSIS — L0390 Cellulitis, unspecified: Secondary | ICD-10-CM | POA: Diagnosis not present

## 2022-07-28 DIAGNOSIS — S2241XD Multiple fractures of ribs, right side, subsequent encounter for fracture with routine healing: Secondary | ICD-10-CM | POA: Diagnosis not present

## 2022-07-28 DIAGNOSIS — C50919 Malignant neoplasm of unspecified site of unspecified female breast: Secondary | ICD-10-CM | POA: Diagnosis not present

## 2022-07-28 DIAGNOSIS — I69891 Dysphagia following other cerebrovascular disease: Secondary | ICD-10-CM | POA: Diagnosis not present

## 2022-07-28 DIAGNOSIS — I482 Chronic atrial fibrillation, unspecified: Secondary | ICD-10-CM | POA: Diagnosis not present

## 2022-07-28 DIAGNOSIS — S72141D Displaced intertrochanteric fracture of right femur, subsequent encounter for closed fracture with routine healing: Secondary | ICD-10-CM | POA: Diagnosis not present

## 2022-07-28 DIAGNOSIS — R531 Weakness: Secondary | ICD-10-CM | POA: Diagnosis not present

## 2022-07-28 DIAGNOSIS — Z9181 History of falling: Secondary | ICD-10-CM | POA: Diagnosis not present

## 2022-07-28 DIAGNOSIS — Z4789 Encounter for other orthopedic aftercare: Secondary | ICD-10-CM | POA: Diagnosis not present

## 2022-07-28 DIAGNOSIS — Z7401 Bed confinement status: Secondary | ICD-10-CM | POA: Diagnosis not present

## 2022-07-28 DIAGNOSIS — S72001A Fracture of unspecified part of neck of right femur, initial encounter for closed fracture: Secondary | ICD-10-CM | POA: Diagnosis not present

## 2022-07-28 DIAGNOSIS — S42291D Other displaced fracture of upper end of right humerus, subsequent encounter for fracture with routine healing: Secondary | ICD-10-CM | POA: Diagnosis not present

## 2022-07-28 DIAGNOSIS — F03C11 Unspecified dementia, severe, with agitation: Secondary | ICD-10-CM | POA: Diagnosis not present

## 2022-07-28 DIAGNOSIS — I5032 Chronic diastolic (congestive) heart failure: Secondary | ICD-10-CM | POA: Diagnosis not present

## 2022-07-28 LAB — CBC
HCT: 36.3 % (ref 36.0–46.0)
Hemoglobin: 10.7 g/dL — ABNORMAL LOW (ref 12.0–15.0)
MCH: 25.6 pg — ABNORMAL LOW (ref 26.0–34.0)
MCHC: 29.5 g/dL — ABNORMAL LOW (ref 30.0–36.0)
MCV: 86.8 fL (ref 80.0–100.0)
Platelets: 518 10*3/uL — ABNORMAL HIGH (ref 150–400)
RBC: 4.18 MIL/uL (ref 3.87–5.11)
RDW: 15.6 % — ABNORMAL HIGH (ref 11.5–15.5)
WBC: 11.1 10*3/uL — ABNORMAL HIGH (ref 4.0–10.5)
nRBC: 0 % (ref 0.0–0.2)

## 2022-07-28 MED ORDER — CALCIUM CARBONATE ANTACID 500 MG PO CHEW: 1.0000 | CHEWABLE_TABLET | Freq: Three times a day (TID) | ORAL | Status: AC | PRN

## 2022-07-28 NOTE — Discharge Summary (Signed)
Physician Discharge Summary  [name] [id number] DOB: 08/31/1929 DOA: 07/23/2022  PCP: Colon Branch, MD  Admit date: 07/23/2022 Discharge date: 07/28/2022  Admitted From: Assisted living facility Disposition: Skilled nursing facility  Recommendations for Outpatient Follow-up:  Follow up with PCP in 1-2 weeks Schedule follow-up with orthopedics in 2 weeks  Home Health: N/A Equipment/Devices: Nasal cannula oxygen to keep saturation more than 90% as needed  Discharge Condition: Fair CODE STATUS: Full code Diet recommendation: Regular diet, nutritional supplements  Discharge summary: 89 year old with history of coronary artery disease, Takotsubo cardiomyopathy, paroxysmal A-fib currently on Eliquis, history of stroke, coronary artery disease on Plavix who is currently at assisted living facility in [place] brought to the emergency room with mechanical fall. She lost her balance and fell on the floor. Skeletal survey consistent with comminuted and displaced proximal humerus fracture, right fifth sixth and seventh lateral rib fractures, right intertrochanter fracture of the femur. Admitted for symptom control and surgical intervention. Seen by orthopedics.  Stabilized to go to a skilled level of care.     Assessment & plan of care:   Closed traumatic right hip fracture:  Day 3 postop right hip IM nailing Dr. Stann Mainland.   Weightbearing as tolerated with a walker.  Fall precautions. Resumed Eliquis for DVT prophylaxis.  Resume Plavix. working with PT OT.   Refer to SNF.   Pain management with oral pain medications along with laxatives.  Avoid constipation.   Right proximal humerus fracture: Sling, conservative management.  Pain medications. Nonweightbearing right shoulder.   Paroxysmal A-fib on Eliquis: Currently sinus rhythm.  Rate controlled on amiodarone and metoprolol.  Eliquis therapeutic.   History of stroke and coronary artery disease: Currently stable.  Recent cardiac  cath.  Resume Plavix.   Blood loss anemia: Expected from long bone fractures and surgery.  Stable.  No evidence of further bleeding.   Dementia without behavioral disturbances: Long-term ALF resident.  She will need skilled nursing facility placement. Patient does use oral lorazepam 2 times daily as needed and would continue to use it.  Prescriptions provided.  Will need further prescriptions at SNF.  Medically stabilized to transfer to SNF level of care.   Discharge Diagnoses:  Principal Problem:   Humerus fracture Active Problems:   ANXIETY DEPRESSION   Breast cancer [facility])    Discharge Instructions  Discharge Instructions     Diet general   Complete by: As directed    Increase activity slowly   Complete by: As directed    Other Restrictions   Complete by: As directed    WBAT with walker right leg NWB right shoulder      Allergies as of 07/28/2022       Reactions   Penicillins Hives, Rash, Other (See Comments)   Has patient had a PCN reaction causing immediate rash, facial/tongue/throat swelling, SOB or lightheadedness with hypotension: Yes Has patient had a PCN reaction causing severe rash involving mucus membranes or skin necrosis: yes Has patient had a PCN reaction that required hospitalization: No Has patient had a PCN reaction occurring within the last 10 years: no If all of the above answers are "NO", then may proceed with Cephalosporin use. Other Reaction: OTHER REACTION   Valsartan Itching, Other (See Comments)   Atorvastatin Diarrhea   Carvedilol Other (See Comments)   "teeth hurt when I took it"   Ezetimibe Nausea Only   Fluvastatin Sodium Other (See Comments)   "Can't remember"   Magnesium Hydroxide Other (See Comments)  REACTION: triggers HAs   Meloxicam Other (See Comments)   Pt seeing auro's / spots.     Pneumovax [pneumococcal Polysaccharide Vaccine] Other (See Comments)   Local reaction   Quinapril Hcl Other (See Comments)    "feelings of  tiredness"   Simvastatin Diarrhea   Topamax [topiramate] Itching   Vit D-vit E-safflower Oil Other (See Comments)   Headaches        Medication List     STOP taking these medications    ondansetron 4 MG tablet Commonly known as: ZOFRAN       TAKE these medications    acetaminophen 325 MG tablet Commonly known as: TYLENOL Take 650 mg by mouth every 6 (six) hours as needed for mild pain.   alendronate 70 MG tablet Commonly known as: FOSAMAX Take 1 tablet (70 mg total) by mouth once a week. Take with a full glass of water on an empty stomach.   amiodarone 200 MG tablet Commonly known as: PACERONE Take 200 mg by mouth daily.   amLODipine 5 MG tablet Commonly known as: NORVASC Take 1 tablet (5 mg total) by mouth daily.   apixaban 2.5 MG Tabs tablet Commonly known as: ELIQUIS Take 1 tablet (2.5 mg total) by mouth 2 (two) times daily. What changed: when to take this   calcium carbonate 500 MG chewable tablet Commonly known as: TUMS - dosed in mg elemental calcium Chew 1 tablet (200 mg of elemental calcium total) by mouth 3 (three) times daily as needed for indigestion or heartburn.   clopidogrel 75 MG tablet Commonly known as: PLAVIX Take 1 tablet (75 mg total) by mouth daily.   docusate sodium 100 MG capsule Commonly known as: COLACE Take 1 capsule (100 mg total) by mouth 2 (two) times daily.   furosemide 20 MG tablet Commonly known as: LASIX Take 1 tablet (20 mg total) by mouth daily as needed for fluid or edema (for weigh gain of 3 pounds or more over one day). What changed:  how much to take when to take this   HYDROcodone-acetaminophen 5-325 MG tablet Commonly known as: NORCO/VICODIN Take 1 tablet by mouth every 6 (six) hours as needed for moderate pain or severe pain.   LORazepam 0.5 MG tablet Commonly known as: ATIVAN Take 1 tablet (0.5 mg total) by mouth 2 (two) times daily as needed for up to 5 days for anxiety. What changed:  when to take  this reasons to take this   metoprolol tartrate 50 MG tablet Commonly known as: LOPRESSOR Take 1 tablet (50 mg total) by mouth 2 (two) times daily.   nitroGLYCERIN 0.4 MG SL tablet Commonly known as: NITROSTAT Place 1 tablet (0.4 mg total) under the tongue every 5 (five) minutes x 3 doses as needed for chest pain.   pantoprazole 40 MG tablet Commonly known as: PROTONIX Take 1 tablet (40 mg total) by mouth at bedtime. What changed: when to take this   polyethylene glycol 17 g packet Commonly known as: MIRALAX / GLYCOLAX Take 17 g by mouth daily.   rosuvastatin 20 MG tablet Commonly known as: CRESTOR Take 1 tablet (20 mg total) by mouth daily. What changed: when to take this   sertraline 25 MG tablet Commonly known as: ZOLOFT Take 12.5 mg by mouth at bedtime.        Contact information for follow-up providers     Nicholes Stairs, MD Follow up in 2 week(s).   Specialty: Orthopedic Surgery Contact information: [address] [place] [address] [id number]              Contact information for after-discharge care     Destination     [facility] Preferred SNF .   Service: Skilled Nursing Contact information: [place] [address] [phone]                    Allergies  Allergen Reactions   Penicillins Hives, Rash and Other (See Comments)    Has patient had a PCN reaction causing immediate rash, facial/tongue/throat swelling, SOB or lightheadedness with hypotension: Yes Has patient had a PCN reaction causing severe rash involving mucus membranes or skin necrosis: yes Has patient had a PCN reaction that required hospitalization: No Has patient had a PCN reaction occurring within the last 10 years: no If all of the above answers are "NO", then may proceed with Cephalosporin use.  Other Reaction: OTHER REACTION   Valsartan Itching and Other (See Comments)   Atorvastatin Diarrhea    Carvedilol Other (See Comments)    "teeth hurt when I took it"    Ezetimibe Nausea Only   Fluvastatin Sodium Other (See Comments)    "Can't remember"   Magnesium Hydroxide Other (See Comments)    REACTION: triggers HAs   Meloxicam Other (See Comments)    Pt seeing auro's / spots.     Pneumovax [Pneumococcal Polysaccharide Vaccine] Other (See Comments)    Local reaction   Quinapril Hcl Other (See Comments)     "feelings of tiredness"   Simvastatin Diarrhea   Topamax [Topiramate] Itching   Vit D-Vit E-Safflower Oil Other (See Comments)    Headaches    Consultations: Orthopedics   Procedures/Studies: DG FEMUR, MIN 2 VIEWS RIGHT  Result Date: 07/25/2022 CLINICAL DATA:  [id number] Surgery, elective [id number] EXAM: RIGHT FEMUR 2 VIEWS COMPARISON:  Radiograph 07/24/2022 FINDINGS: Intraoperative images during right proximal femur ORIF with cephalomedullary nail for an intertrochanteric fracture. Improved fracture alignment. No evidence of immediate complication. IMPRESSION: Intraoperative images during right proximal femur ORIF. Improved fracture alignment. No evidence of immediate complication. Electronically Signed   By: Maurine Simmering M.D.   On: 07/25/2022 16:52   DG C-Arm 1-60 Min-No Report  Result Date: 07/25/2022 Fluoroscopy was utilized by the requesting physician.  No radiographic interpretation.   DG FEMUR PORT, 1V RIGHT  Result Date: 07/24/2022 CLINICAL DATA:  Pain post fall yesterday EXAM: RIGHT FEMUR PORTABLE 1 VIEW COMPARISON:  07/23/2022 FINDINGS: Displaced intertrochanteric fracture RIGHT femur with varus angulation. No dislocation. Osseous demineralization with narrowing of RIGHT hip joint and RIGHT knee joint. Visualized pelvis intact. Scattered atherosclerotic calcifications. IMPRESSION: Displaced angulated intertrochanteric fracture RIGHT femur. Osseous demineralization with degenerative changes RIGHT hip and RIGHT knee. Electronically Signed   By: Lavonia Dana M.D.   On:  07/24/2022 09:20   CT Head Wo Contrast  Result Date: 07/24/2022 CLINICAL DATA:  Polytrauma, blunt.  Fall from standing position. EXAM: CT HEAD WITHOUT CONTRAST CT CERVICAL SPINE WITHOUT CONTRAST TECHNIQUE: Multidetector CT imaging of the head and cervical spine was performed following the standard protocol without intravenous contrast. Multiplanar CT image reconstructions of the cervical spine were also generated. RADIATION DOSE REDUCTION: This exam was performed according to the departmental dose-optimization program which includes automated exposure control, adjustment of the mA and/or kV according to patient size and/or use of iterative reconstruction technique. COMPARISON:  11/24/2021. FINDINGS: CT HEAD FINDINGS Brain: No acute intracranial hemorrhage, midline shift or mass effect. No extra-axial fluid  collection. Extensive subcortical and periventricular white matter hypodensities are present bilaterally. There is encephalomalacia in the frontal lobe on the right. An old infarct is noted in the thalamus on the right. There is encephalomalacia in the parietal lobe on the left which is new from the prior exam. There is an old lacunar infarct in the left cerebellar hemisphere. Vascular: No hyperdense vessel or unexpected calcification. Skull: Normal. Negative for fracture or focal lesion. Sinuses/Orbits: No acute finding. Other: None. CT CERVICAL SPINE FINDINGS Alignment: Normal. Skull base and vertebrae: No acute fracture in the cervical spine. There is an old wedge shaped compression deformity in the superior endplate at T1. Soft tissues and spinal canal: No prevertebral fluid or swelling. No visible canal hematoma. Disc levels: Multilevel intervertebral disc space narrowing, uncovertebral osteophyte formation, and facet arthropathy. No significant spinal canal stenosis. Upper chest: Scarring is noted at the lung apices. Other: Carotid artery calcifications are noted. IMPRESSION: 1. No acute intracranial  hemorrhage. 2. Chronic microvascular ischemic changes and old infarcts. Encephalomalacia is noted in the parietal lobe on the left which is new from the previous exam, although likely nonacute. If there is concern for acute infarct, MRI is suggested for further evaluation. 3. Multilevel degenerative changes in the cervical spine without evidence of acute fracture. 4. Chronic compression deformity in the superior endplate at T1. Electronically Signed   By: Brett Fairy M.D.   On: 07/24/2022 00:29   CT Cervical Spine Wo Contrast  Result Date: 07/24/2022 CLINICAL DATA:  Polytrauma, blunt.  Fall from standing position. EXAM: CT HEAD WITHOUT CONTRAST CT CERVICAL SPINE WITHOUT CONTRAST TECHNIQUE: Multidetector CT imaging of the head and cervical spine was performed following the standard protocol without intravenous contrast. Multiplanar CT image reconstructions of the cervical spine were also generated. RADIATION DOSE REDUCTION: This exam was performed according to the departmental dose-optimization program which includes automated exposure control, adjustment of the mA and/or kV according to patient size and/or use of iterative reconstruction technique. COMPARISON:  11/24/2021. FINDINGS: CT HEAD FINDINGS Brain: No acute intracranial hemorrhage, midline shift or mass effect. No extra-axial fluid collection. Extensive subcortical and periventricular white matter hypodensities are present bilaterally. There is encephalomalacia in the frontal lobe on the right. An old infarct is noted in the thalamus on the right. There is encephalomalacia in the parietal lobe on the left which is new from the prior exam. There is an old lacunar infarct in the left cerebellar hemisphere. Vascular: No hyperdense vessel or unexpected calcification. Skull: Normal. Negative for fracture or focal lesion. Sinuses/Orbits: No acute finding. Other: None. CT CERVICAL SPINE FINDINGS Alignment: Normal. Skull base and vertebrae: No acute fracture  in the cervical spine. There is an old wedge shaped compression deformity in the superior endplate at T1. Soft tissues and spinal canal: No prevertebral fluid or swelling. No visible canal hematoma. Disc levels: Multilevel intervertebral disc space narrowing, uncovertebral osteophyte formation, and facet arthropathy. No significant spinal canal stenosis. Upper chest: Scarring is noted at the lung apices. Other: Carotid artery calcifications are noted. IMPRESSION: 1. No acute intracranial hemorrhage. 2. Chronic microvascular ischemic changes and old infarcts. Encephalomalacia is noted in the parietal lobe on the left which is new from the previous exam, although likely nonacute. If there is concern for acute infarct, MRI is suggested for further evaluation. 3. Multilevel degenerative changes in the cervical spine without evidence of acute fracture. 4. Chronic compression deformity in the superior endplate at T1. Electronically Signed   By: Regan Rakers.D.  On: 07/24/2022 00:29   DG Shoulder Right  Result Date: 07/23/2022 CLINICAL DATA:  Fall EXAM: RIGHT SHOULDER - 2+ VIEW COMPARISON:  None Available. FINDINGS: AC joint appears intact with moderate degenerative change. Acute comminuted proximal humerus fracture involves the humeral neck and greater tuberosity. The humeral head appears to project over glenoid. About 1/2 shaft diameter anterior displacement of distal fracture fragment IMPRESSION: Acute comminuted and displaced proximal humerus fracture involving the humeral neck and greater tuberosity. Electronically Signed   By: Donavan Foil M.D.   On: 07/23/2022 23:52   DG Chest Portable 1 View  Result Date: 07/23/2022 CLINICAL DATA:  Fall EXAM: PORTABLE CHEST 1 VIEW COMPARISON:  02/06/2022, CT 10/01/2021 FINDINGS: Acute comminuted proximal right humerus fracture. No acute airspace disease or pleural effusion. Retrocardiac opacity corresponding to moderate to large hiatal hernia. Aortic atherosclerosis.  Multiple treated compression deformities of the spine. Postsurgical changes of the left breast. Old appearing left-sided rib fractures. Acute appearing right fifth, sixth and possibly seventh lateral rib fractures. IMPRESSION: 1. Acute comminuted proximal right humerus fracture. 2. Acute appearing right fifth, sixth and possibly seventh lateral rib fractures. 3. Hiatal hernia. 4. No acute airspace disease. Electronically Signed   By: Donavan Foil M.D.   On: 07/23/2022 23:50   DG Hip Unilat W or Wo Pelvis 2-3 Views Right  Result Date: 07/23/2022 CLINICAL DATA:  Fall EXAM: DG HIP (WITH OR WITHOUT PELVIS) 2-3V RIGHT COMPARISON:  11/24/2021 FINDINGS: SI joints are non widened. Pubic symphysis appears intact. Previous intramedullary rodding of the left femur with similar appearance of hardware. Old appearing deformity of the left inferior pubic ramus. Acute comminuted and displaced right intertrochanteric fracture. Right femoral head projects in joint. IMPRESSION: Acute comminuted and displaced right intertrochanteric fracture. Electronically Signed   By: Donavan Foil M.D.   On: 07/23/2022 23:46   DG Tibia/Fibula Right  Result Date: 07/09/2022 CLINICAL DATA:  Right leg pain EXAM: RIGHT TIBIA AND FIBULA - 2 VIEW COMPARISON:  Right ankle radiographs 03/02/2009 FINDINGS: There is no acute fracture or dislocation. Bony alignment is normal. There is inferior calcaneal spurring. The soft tissues are unremarkable. IMPRESSION: No acute finding in the tibia/fibula. Electronically Signed   By: Valetta Mole M.D.   On: 07/09/2022 13:00   (Echo, Carotid, EGD, Colonoscopy, ERCP)    Subjective: Patient seen in the morning rounds.  Pleasant.  Slightly confused but that is her baseline.  She does not remember this provider talking since last 5 days.  She was asking me whether she is still in the hospital.  She knows [facility] and was happy when I told her she is going there today.   Discharge Exam: Vitals:   07/27/22  2031 07/28/22 0433  BP: (!) 135/92 136/61  Pulse: 70 73  Resp: 16 18  Temp: 98.6 F (37 C) 98.7 F (37.1 C)  SpO2: 98% 100%   Vitals:   07/27/22 1000 07/27/22 1604 07/27/22 2031 07/28/22 0433  BP:  (!) 121/57 (!) 135/92 136/61  Pulse:  63 70 73  Resp:  16 16 18   Temp:  98.3 F (36.8 C) 98.6 F (37 C) 98.7 F (37.1 C)  TempSrc:  Oral Oral   SpO2: 98% 95% 98% 100%  Weight:      Height:        General: Pt is alert, awake, not in acute distress Frail debilitated.  She is alert awake.  She is oriented to herself and somehow about her situation.  She is not oriented  to time place. Cardiovascular: RRR, S1/S2 +, no rubs, no gallops Respiratory: CTA bilaterally, no wheezing, no rhonchi Abdominal: Soft, NT, ND, bowel sounds + Extremities: no edema, no cyanosis  Right arm on sling.  Distal neurovascular status intact. Right lateral thigh incisions clean and dry.  Dressings intact.    The results of significant diagnostics from this hospitalization (including imaging, microbiology, ancillary and laboratory) are listed below for reference.     Microbiology: Recent Results (from the past 240 hour(s))  MRSA Next Gen by PCR, Nasal     Status: None   Collection Time: 07/24/22  8:23 PM   Specimen: Nasal Mucosa; Nasal Swab  Result Value Ref Range Status   MRSA by PCR Next Gen NOT DETECTED NOT DETECTED Final    Comment: (NOTE) The GeneXpert MRSA Assay (FDA approved for NASAL specimens only), is one component of a comprehensive MRSA colonization surveillance program. It is not intended to diagnose MRSA infection nor to guide or monitor treatment for MRSA infections. Test performance is not FDA approved in patients less than 66 years old. Performed at [facility], [place] [address] [place], [place] [address]      Labs: BNP (last 3 results) No results for input(s): "BNP" in the last 8760 hours. Basic Metabolic Panel: Recent Labs  Lab 07/23/22 2344 07/24/22 0715  07/26/22 0330  NA 135 136 134*  K 3.5 3.4* 4.7  CL 99 99 99  CO2  --  26 28  GLUCOSE 142* 160* 163*  BUN 15 12 17   CREATININE 1.00 0.77 0.78  CALCIUM  --  8.7* 8.5*   Liver Function Tests: No results for input(s): "AST", "ALT", "ALKPHOS", "BILITOT", "PROT", "ALBUMIN" in the last 168 hours. No results for input(s): "LIPASE", "AMYLASE" in the last 168 hours. No results for input(s): "AMMONIA" in the last 168 hours. CBC: Recent Labs  Lab 07/23/22 2309 07/23/22 2344 07/24/22 0715 07/26/22 0330 07/27/22 0601 07/28/22 0358  WBC 9.6  --  13.5* 14.6* 12.8* 11.1*  NEUTROABS 7.8*  --  11.9*  --   --   --   HGB 13.3 14.6 12.1 10.8* 10.5* 10.7*  HCT 42.0 43.0 37.8 35.7* 33.7* 36.3  MCV 84.5  --  83.1 86.2 85.3 86.8  PLT 493*  --  472* 494* 496* 518*   Cardiac Enzymes: No results for input(s): "CKTOTAL", "CKMB", "CKMBINDEX", "TROPONINI" in the last 168 hours. BNP: Invalid input(s): "POCBNP" CBG: Recent Labs  Lab 07/24/22 1156  GLUCAP 143*   D-Dimer No results for input(s): "DDIMER" in the last 72 hours. Hgb A1c No results for input(s): "HGBA1C" in the last 72 hours. Lipid Profile No results for input(s): "CHOL", "HDL", "LDLCALC", "TRIG", "CHOLHDL", "LDLDIRECT" in the last 72 hours. Thyroid function studies No results for input(s): "TSH", "T4TOTAL", "T3FREE", "THYROIDAB" in the last 72 hours.  Invalid input(s): "FREET3" Anemia work up No results for input(s): "VITAMINB12", "FOLATE", "FERRITIN", "TIBC", "IRON", "RETICCTPCT" in the last 72 hours. Urinalysis    Component Value Date/Time   COLORURINE STRAW (A) 02/27/2022 1639   APPEARANCEUR CLEAR 02/27/2022 1639   LABSPEC 1.004 (L) 02/27/2022 1639   LABSPEC 1.010 05/27/2013 1433   PHURINE 7.0 02/27/2022 1639   GLUCOSEU NEGATIVE 02/27/2022 1639   GLUCOSEU NEGATIVE 09/02/2017 1347   HGBUR NEGATIVE 02/27/2022 1639   HGBUR negative 10/06/2009 1015   BILIRUBINUR NEGATIVE 02/27/2022 1639   BILIRUBINUR Negative 01/21/2018  [place] 02/27/2022 [place] 02/27/2022 1639   UROBILINOGEN negative (A)  01/21/2018 1618   UROBILINOGEN 0.2 09/02/2017 1347   UROBILINOGEN 0.2 05/27/2013 1433   NITRITE NEGATIVE 02/27/2022 1639   LEUKOCYTESUR NEGATIVE 02/27/2022 1639   Sepsis Labs Recent Labs  Lab 07/24/22 0715 07/26/22 0330 07/27/22 0601 07/28/22 0358  WBC 13.5* 14.6* 12.8* 11.1*   Microbiology Recent Results (from the past 240 hour(s))  MRSA Next Gen by PCR, Nasal     Status: None   Collection Time: 07/24/22  8:23 PM   Specimen: Nasal Mucosa; Nasal Swab  Result Value Ref Range Status   MRSA by PCR Next Gen NOT DETECTED NOT DETECTED Final    Comment: (NOTE) The GeneXpert MRSA Assay (FDA approved for NASAL specimens only), is one component of a comprehensive MRSA colonization surveillance program. It is not intended to diagnose MRSA infection nor to guide or monitor treatment for MRSA infections. Test performance is not FDA approved in patients less than 67 years old. Performed at [facility], [place] [address], [place], [place] [address]      Time coordinating discharge: 40 minutes  SIGNED:   Barb Merino, MD  [facility] 07/28/2022, 8:01 AM

## 2022-07-28 NOTE — TOC Transition Note (Signed)
Transition of Care [facility]) - CM/SW Discharge Note   Patient Details  Name: [name] MRN: [id number] Date of Birth: 08/20/1929  [facility] CM/SW Contact:  Amador Cunas, [place] Phone Number: 07/28/2022, 2:29 PM   Clinical Narrative: Pt for dc to [facility] today. Spoke to Amidon at [facility] who confirmed they are prepared to admit pt to room 110-P. Pt's dtr Early Chars aware of dc and reports agreeable. RN provided with number for report and [facility]AR arranged for transport. SW signing off at dc.   Wandra Feinstein, MSW, LCSW [phone] (coverage)        Final next level of care: Skilled Nursing Facility Barriers to Discharge: No Barriers Identified   Patient Goals and CMS Choice CMS Medicare.gov Compare Post Acute Care list provided to:: Patient Choice offered to / list presented to : Adult Children  Discharge Placement                Patient chooses bed at: [facility] Patient to be transferred to facility by: [facility] Name of family member notified: [name]/dtr Patient and family notified of of transfer: 07/28/22  Discharge Plan and Services Additional resources added to the After Visit Summary for                                       Social Determinants of Health (SDOH) Interventions SDOH Screenings   Food Insecurity: No Food Insecurity (07/24/2022)  Housing: Low Risk  (07/24/2022)  Transportation Needs: No Transportation Needs (07/24/2022)  Utilities: Not At Risk (07/24/2022)  Depression (PHQ2-9): Low Risk  (12/19/2021)  Financial Resource Strain: Low Risk  (08/10/2021)  Tobacco Use: High Risk (07/26/2022)     Readmission Risk Interventions     No data to display

## 2022-07-28 NOTE — Plan of Care (Signed)
  Problem: Education: Goal: Knowledge of General Education information will improve Description Including pain rating scale, medication(s)/side effects and non-pharmacologic comfort measures Outcome: Adequate for Discharge   Problem: Health Behavior/Discharge Planning: Goal: Ability to manage health-related needs will improve Outcome: Adequate for Discharge   Problem: Clinical Measurements: Goal: Ability to maintain clinical measurements within normal limits will improve Outcome: Adequate for Discharge Goal: Will remain free from infection Outcome: Adequate for Discharge Goal: Diagnostic test results will improve Outcome: Adequate for Discharge Goal: Respiratory complications will improve Outcome: Adequate for Discharge Goal: Cardiovascular complication will be avoided Outcome: Adequate for Discharge   Problem: Activity: Goal: Risk for activity intolerance will decrease Outcome: Adequate for Discharge   Problem: Nutrition: Goal: Adequate nutrition will be maintained Outcome: Adequate for Discharge   Problem: Coping: Goal: Level of anxiety will decrease Outcome: Adequate for Discharge   Problem: Elimination: Goal: Will not experience complications related to bowel motility Outcome: Adequate for Discharge Goal: Will not experience complications related to urinary retention Outcome: Adequate for Discharge   Problem: Pain Managment: Goal: General experience of comfort will improve Outcome: Adequate for Discharge   Problem: Safety: Goal: Ability to remain free from injury will improve Outcome: Adequate for Discharge   Problem: Skin Integrity: Goal: Risk for impaired skin integrity will decrease Outcome: Adequate for Discharge   Problem: Education: Goal: Verbalization of understanding the information provided (i.e., activity precautions, restrictions, etc) will improve Outcome: Adequate for Discharge Goal: Individualized Educational Video(s) Outcome: Adequate for  Discharge   Problem: Activity: Goal: Ability to ambulate and perform ADLs will improve Outcome: Adequate for Discharge   Problem: Clinical Measurements: Goal: Postoperative complications will be avoided or minimized Outcome: Adequate for Discharge   Problem: Self-Concept: Goal: Ability to maintain and perform role responsibilities to the fullest extent possible will improve Outcome: Adequate for Discharge   Problem: Pain Management: Goal: Pain level will decrease Outcome: Adequate for Discharge   

## 2022-07-30 ENCOUNTER — Encounter (HOSPITAL_COMMUNITY): Payer: Self-pay | Admitting: Orthopedic Surgery

## 2022-07-30 DIAGNOSIS — S2241XD Multiple fractures of ribs, right side, subsequent encounter for fracture with routine healing: Secondary | ICD-10-CM | POA: Diagnosis not present

## 2022-07-30 DIAGNOSIS — S42291D Other displaced fracture of upper end of right humerus, subsequent encounter for fracture with routine healing: Secondary | ICD-10-CM | POA: Diagnosis not present

## 2022-07-30 DIAGNOSIS — R2689 Other abnormalities of gait and mobility: Secondary | ICD-10-CM | POA: Diagnosis not present

## 2022-07-30 DIAGNOSIS — I69891 Dysphagia following other cerebrovascular disease: Secondary | ICD-10-CM | POA: Diagnosis not present

## 2022-07-30 DIAGNOSIS — I5032 Chronic diastolic (congestive) heart failure: Secondary | ICD-10-CM | POA: Diagnosis not present

## 2022-07-30 DIAGNOSIS — S72141D Displaced intertrochanteric fracture of right femur, subsequent encounter for closed fracture with routine healing: Secondary | ICD-10-CM | POA: Diagnosis not present

## 2022-07-30 DIAGNOSIS — R2681 Unsteadiness on feet: Secondary | ICD-10-CM | POA: Diagnosis not present

## 2022-07-30 DIAGNOSIS — M6281 Muscle weakness (generalized): Secondary | ICD-10-CM | POA: Diagnosis not present

## 2022-07-30 DIAGNOSIS — Z9181 History of falling: Secondary | ICD-10-CM | POA: Diagnosis not present

## 2022-07-31 ENCOUNTER — Other Ambulatory Visit: Payer: Self-pay | Admitting: *Deleted

## 2022-07-31 NOTE — Patient Outreach (Signed)
[name] recently admitted to [facility].   Will follow for potential [facility] care coordination services as benefit of health plan and PCP.   [name], MSN, RN,BSN [place] Acute Care Coordinator [phone] (Direct dial)

## 2022-08-02 DIAGNOSIS — Z9181 History of falling: Secondary | ICD-10-CM | POA: Diagnosis not present

## 2022-08-02 DIAGNOSIS — S72141D Displaced intertrochanteric fracture of right femur, subsequent encounter for closed fracture with routine healing: Secondary | ICD-10-CM | POA: Diagnosis not present

## 2022-08-02 DIAGNOSIS — R2681 Unsteadiness on feet: Secondary | ICD-10-CM | POA: Diagnosis not present

## 2022-08-02 DIAGNOSIS — S42291D Other displaced fracture of upper end of right humerus, subsequent encounter for fracture with routine healing: Secondary | ICD-10-CM | POA: Diagnosis not present

## 2022-08-02 DIAGNOSIS — M6281 Muscle weakness (generalized): Secondary | ICD-10-CM | POA: Diagnosis not present

## 2022-08-02 DIAGNOSIS — R2689 Other abnormalities of gait and mobility: Secondary | ICD-10-CM | POA: Diagnosis not present

## 2022-08-02 DIAGNOSIS — I5032 Chronic diastolic (congestive) heart failure: Secondary | ICD-10-CM | POA: Diagnosis not present

## 2022-08-02 DIAGNOSIS — S2241XD Multiple fractures of ribs, right side, subsequent encounter for fracture with routine healing: Secondary | ICD-10-CM | POA: Diagnosis not present

## 2022-08-02 DIAGNOSIS — I69891 Dysphagia following other cerebrovascular disease: Secondary | ICD-10-CM | POA: Diagnosis not present

## 2022-08-06 DIAGNOSIS — M6281 Muscle weakness (generalized): Secondary | ICD-10-CM | POA: Diagnosis not present

## 2022-08-06 DIAGNOSIS — I69891 Dysphagia following other cerebrovascular disease: Secondary | ICD-10-CM | POA: Diagnosis not present

## 2022-08-06 DIAGNOSIS — S72141D Displaced intertrochanteric fracture of right femur, subsequent encounter for closed fracture with routine healing: Secondary | ICD-10-CM | POA: Diagnosis not present

## 2022-08-06 DIAGNOSIS — S2241XD Multiple fractures of ribs, right side, subsequent encounter for fracture with routine healing: Secondary | ICD-10-CM | POA: Diagnosis not present

## 2022-08-06 DIAGNOSIS — I5032 Chronic diastolic (congestive) heart failure: Secondary | ICD-10-CM | POA: Diagnosis not present

## 2022-08-06 DIAGNOSIS — Z9181 History of falling: Secondary | ICD-10-CM | POA: Diagnosis not present

## 2022-08-06 DIAGNOSIS — S42291D Other displaced fracture of upper end of right humerus, subsequent encounter for fracture with routine healing: Secondary | ICD-10-CM | POA: Diagnosis not present

## 2022-08-06 DIAGNOSIS — R2681 Unsteadiness on feet: Secondary | ICD-10-CM | POA: Diagnosis not present

## 2022-08-06 DIAGNOSIS — R2689 Other abnormalities of gait and mobility: Secondary | ICD-10-CM | POA: Diagnosis not present

## 2022-08-07 DIAGNOSIS — I2111 ST elevation (STEMI) myocardial infarction involving right coronary artery: Secondary | ICD-10-CM | POA: Diagnosis not present

## 2022-08-09 DIAGNOSIS — S72141D Displaced intertrochanteric fracture of right femur, subsequent encounter for closed fracture with routine healing: Secondary | ICD-10-CM | POA: Diagnosis not present

## 2022-08-09 DIAGNOSIS — Z9181 History of falling: Secondary | ICD-10-CM | POA: Diagnosis not present

## 2022-08-09 DIAGNOSIS — R2689 Other abnormalities of gait and mobility: Secondary | ICD-10-CM | POA: Diagnosis not present

## 2022-08-09 DIAGNOSIS — I69891 Dysphagia following other cerebrovascular disease: Secondary | ICD-10-CM | POA: Diagnosis not present

## 2022-08-09 DIAGNOSIS — M6281 Muscle weakness (generalized): Secondary | ICD-10-CM | POA: Diagnosis not present

## 2022-08-09 DIAGNOSIS — S42291D Other displaced fracture of upper end of right humerus, subsequent encounter for fracture with routine healing: Secondary | ICD-10-CM | POA: Diagnosis not present

## 2022-08-09 DIAGNOSIS — I5032 Chronic diastolic (congestive) heart failure: Secondary | ICD-10-CM | POA: Diagnosis not present

## 2022-08-09 DIAGNOSIS — R2681 Unsteadiness on feet: Secondary | ICD-10-CM | POA: Diagnosis not present

## 2022-08-09 DIAGNOSIS — S2241XD Multiple fractures of ribs, right side, subsequent encounter for fracture with routine healing: Secondary | ICD-10-CM | POA: Diagnosis not present

## 2022-08-09 DIAGNOSIS — Z4789 Encounter for other orthopedic aftercare: Secondary | ICD-10-CM | POA: Diagnosis not present

## 2022-08-13 DIAGNOSIS — S2241XD Multiple fractures of ribs, right side, subsequent encounter for fracture with routine healing: Secondary | ICD-10-CM | POA: Diagnosis not present

## 2022-08-13 DIAGNOSIS — I5032 Chronic diastolic (congestive) heart failure: Secondary | ICD-10-CM | POA: Diagnosis not present

## 2022-08-13 DIAGNOSIS — L0390 Cellulitis, unspecified: Secondary | ICD-10-CM | POA: Diagnosis not present

## 2022-08-13 DIAGNOSIS — S42291D Other displaced fracture of upper end of right humerus, subsequent encounter for fracture with routine healing: Secondary | ICD-10-CM | POA: Diagnosis not present

## 2022-08-13 DIAGNOSIS — S72141D Displaced intertrochanteric fracture of right femur, subsequent encounter for closed fracture with routine healing: Secondary | ICD-10-CM | POA: Diagnosis not present

## 2022-08-13 DIAGNOSIS — R2681 Unsteadiness on feet: Secondary | ICD-10-CM | POA: Diagnosis not present

## 2022-08-13 DIAGNOSIS — R2689 Other abnormalities of gait and mobility: Secondary | ICD-10-CM | POA: Diagnosis not present

## 2022-08-13 DIAGNOSIS — I69891 Dysphagia following other cerebrovascular disease: Secondary | ICD-10-CM | POA: Diagnosis not present

## 2022-08-13 DIAGNOSIS — M6281 Muscle weakness (generalized): Secondary | ICD-10-CM | POA: Diagnosis not present

## 2022-08-13 DIAGNOSIS — Z9181 History of falling: Secondary | ICD-10-CM | POA: Diagnosis not present

## 2022-08-13 DIAGNOSIS — D500 Iron deficiency anemia secondary to blood loss (chronic): Secondary | ICD-10-CM | POA: Diagnosis not present

## 2022-08-14 ENCOUNTER — Other Ambulatory Visit: Payer: Self-pay | Admitting: *Deleted

## 2022-08-14 DIAGNOSIS — S72141D Displaced intertrochanteric fracture of right femur, subsequent encounter for closed fracture with routine healing: Secondary | ICD-10-CM | POA: Diagnosis not present

## 2022-08-14 DIAGNOSIS — I482 Chronic atrial fibrillation, unspecified: Secondary | ICD-10-CM | POA: Diagnosis not present

## 2022-08-14 DIAGNOSIS — I5020 Unspecified systolic (congestive) heart failure: Secondary | ICD-10-CM | POA: Diagnosis not present

## 2022-08-14 DIAGNOSIS — F03C11 Unspecified dementia, severe, with agitation: Secondary | ICD-10-CM | POA: Diagnosis not present

## 2022-08-14 NOTE — Patient Outreach (Signed)
[facility] Post- Acute Care Coordinator follow up. Mrs. Dumouchel resides in [facility]. Screening for potential [facility] care coordination services as benefit of health plan and PCP.   Recent update from [place], [place] Education officer, museum. Mrs. Wyszynski is from [facility] . Transition plans pending progress.   Will continue to follow.  [name], MSN, RN,BSN [place] Acute Care Coordinator [phone] (Direct dial)

## 2022-08-16 DIAGNOSIS — S2241XD Multiple fractures of ribs, right side, subsequent encounter for fracture with routine healing: Secondary | ICD-10-CM | POA: Diagnosis not present

## 2022-08-16 DIAGNOSIS — S42291D Other displaced fracture of upper end of right humerus, subsequent encounter for fracture with routine healing: Secondary | ICD-10-CM | POA: Diagnosis not present

## 2022-08-16 DIAGNOSIS — S72141D Displaced intertrochanteric fracture of right femur, subsequent encounter for closed fracture with routine healing: Secondary | ICD-10-CM | POA: Diagnosis not present

## 2022-08-16 DIAGNOSIS — R2689 Other abnormalities of gait and mobility: Secondary | ICD-10-CM | POA: Diagnosis not present

## 2022-08-16 DIAGNOSIS — R2681 Unsteadiness on feet: Secondary | ICD-10-CM | POA: Diagnosis not present

## 2022-08-16 DIAGNOSIS — I5032 Chronic diastolic (congestive) heart failure: Secondary | ICD-10-CM | POA: Diagnosis not present

## 2022-08-16 DIAGNOSIS — M6281 Muscle weakness (generalized): Secondary | ICD-10-CM | POA: Diagnosis not present

## 2022-08-16 DIAGNOSIS — Z9181 History of falling: Secondary | ICD-10-CM | POA: Diagnosis not present

## 2022-08-16 DIAGNOSIS — I69891 Dysphagia following other cerebrovascular disease: Secondary | ICD-10-CM | POA: Diagnosis not present

## 2022-08-20 DIAGNOSIS — S72141D Displaced intertrochanteric fracture of right femur, subsequent encounter for closed fracture with routine healing: Secondary | ICD-10-CM | POA: Diagnosis not present

## 2022-08-20 DIAGNOSIS — M6281 Muscle weakness (generalized): Secondary | ICD-10-CM | POA: Diagnosis not present

## 2022-08-20 DIAGNOSIS — R2689 Other abnormalities of gait and mobility: Secondary | ICD-10-CM | POA: Diagnosis not present

## 2022-08-20 DIAGNOSIS — S42291D Other displaced fracture of upper end of right humerus, subsequent encounter for fracture with routine healing: Secondary | ICD-10-CM | POA: Diagnosis not present

## 2022-08-20 DIAGNOSIS — Z9181 History of falling: Secondary | ICD-10-CM | POA: Diagnosis not present

## 2022-08-20 DIAGNOSIS — S2241XD Multiple fractures of ribs, right side, subsequent encounter for fracture with routine healing: Secondary | ICD-10-CM | POA: Diagnosis not present

## 2022-08-20 DIAGNOSIS — I69891 Dysphagia following other cerebrovascular disease: Secondary | ICD-10-CM | POA: Diagnosis not present

## 2022-08-20 DIAGNOSIS — I5032 Chronic diastolic (congestive) heart failure: Secondary | ICD-10-CM | POA: Diagnosis not present

## 2022-08-20 DIAGNOSIS — R2681 Unsteadiness on feet: Secondary | ICD-10-CM | POA: Diagnosis not present

## 2022-08-23 DIAGNOSIS — S72141D Displaced intertrochanteric fracture of right femur, subsequent encounter for closed fracture with routine healing: Secondary | ICD-10-CM | POA: Diagnosis not present

## 2022-08-23 DIAGNOSIS — M6281 Muscle weakness (generalized): Secondary | ICD-10-CM | POA: Diagnosis not present

## 2022-08-23 DIAGNOSIS — I5032 Chronic diastolic (congestive) heart failure: Secondary | ICD-10-CM | POA: Diagnosis not present

## 2022-08-23 DIAGNOSIS — I69891 Dysphagia following other cerebrovascular disease: Secondary | ICD-10-CM | POA: Diagnosis not present

## 2022-08-23 DIAGNOSIS — S42291D Other displaced fracture of upper end of right humerus, subsequent encounter for fracture with routine healing: Secondary | ICD-10-CM | POA: Diagnosis not present

## 2022-08-23 DIAGNOSIS — S2241XD Multiple fractures of ribs, right side, subsequent encounter for fracture with routine healing: Secondary | ICD-10-CM | POA: Diagnosis not present

## 2022-08-23 DIAGNOSIS — R2681 Unsteadiness on feet: Secondary | ICD-10-CM | POA: Diagnosis not present

## 2022-08-23 DIAGNOSIS — Z9181 History of falling: Secondary | ICD-10-CM | POA: Diagnosis not present

## 2022-08-23 DIAGNOSIS — R2689 Other abnormalities of gait and mobility: Secondary | ICD-10-CM | POA: Diagnosis not present

## 2022-08-24 DIAGNOSIS — S72141D Displaced intertrochanteric fracture of right femur, subsequent encounter for closed fracture with routine healing: Secondary | ICD-10-CM | POA: Diagnosis not present

## 2022-08-24 DIAGNOSIS — E559 Vitamin D deficiency, unspecified: Secondary | ICD-10-CM | POA: Diagnosis not present

## 2022-08-24 DIAGNOSIS — Z9181 History of falling: Secondary | ICD-10-CM | POA: Diagnosis not present

## 2022-08-24 DIAGNOSIS — S42291D Other displaced fracture of upper end of right humerus, subsequent encounter for fracture with routine healing: Secondary | ICD-10-CM | POA: Diagnosis not present

## 2022-08-24 DIAGNOSIS — E08319 Diabetes mellitus due to underlying condition with unspecified diabetic retinopathy without macular edema: Secondary | ICD-10-CM | POA: Diagnosis not present

## 2022-08-24 DIAGNOSIS — R2689 Other abnormalities of gait and mobility: Secondary | ICD-10-CM | POA: Diagnosis not present

## 2022-08-24 DIAGNOSIS — I482 Chronic atrial fibrillation, unspecified: Secondary | ICD-10-CM | POA: Diagnosis not present

## 2022-08-24 DIAGNOSIS — I2510 Atherosclerotic heart disease of native coronary artery without angina pectoris: Secondary | ICD-10-CM | POA: Diagnosis not present

## 2022-08-24 DIAGNOSIS — M6281 Muscle weakness (generalized): Secondary | ICD-10-CM | POA: Diagnosis not present

## 2022-08-24 DIAGNOSIS — I10 Essential (primary) hypertension: Secondary | ICD-10-CM | POA: Diagnosis not present

## 2022-08-27 DIAGNOSIS — I110 Hypertensive heart disease with heart failure: Secondary | ICD-10-CM | POA: Diagnosis not present

## 2022-08-27 DIAGNOSIS — F03C11 Unspecified dementia, severe, with agitation: Secondary | ICD-10-CM | POA: Diagnosis not present

## 2022-08-27 DIAGNOSIS — I5020 Unspecified systolic (congestive) heart failure: Secondary | ICD-10-CM | POA: Diagnosis not present

## 2022-08-27 DIAGNOSIS — I5032 Chronic diastolic (congestive) heart failure: Secondary | ICD-10-CM | POA: Diagnosis not present

## 2022-08-27 DIAGNOSIS — F03C3 Unspecified dementia, severe, with mood disturbance: Secondary | ICD-10-CM | POA: Diagnosis not present

## 2022-08-27 DIAGNOSIS — F32A Depression, unspecified: Secondary | ICD-10-CM | POA: Diagnosis not present

## 2022-08-29 ENCOUNTER — Other Ambulatory Visit: Payer: Self-pay | Admitting: *Deleted

## 2022-08-29 NOTE — Patient Outreach (Signed)
[facility] Post-Acute Care Coordinator follow up. Verified in [facility] [name] discharged from [facility] on 08/24/22.   Confirmed with [name] [place] social worker, [name] returned to ALF with [facility] home health. Wheelchair was ordered.   No identifiable [facility] care coordination needs.   Dawn Noble, MSN, RN,BSN [facility] Post Acute Care Coordinator [phone] (Direct dial)

## 2022-08-30 DIAGNOSIS — F03C3 Unspecified dementia, severe, with mood disturbance: Secondary | ICD-10-CM | POA: Diagnosis not present

## 2022-08-30 DIAGNOSIS — F03C11 Unspecified dementia, severe, with agitation: Secondary | ICD-10-CM | POA: Diagnosis not present

## 2022-08-30 DIAGNOSIS — I110 Hypertensive heart disease with heart failure: Secondary | ICD-10-CM | POA: Diagnosis not present

## 2022-08-30 DIAGNOSIS — I5020 Unspecified systolic (congestive) heart failure: Secondary | ICD-10-CM | POA: Diagnosis not present

## 2022-08-30 DIAGNOSIS — I5032 Chronic diastolic (congestive) heart failure: Secondary | ICD-10-CM | POA: Diagnosis not present

## 2022-08-30 DIAGNOSIS — F32A Depression, unspecified: Secondary | ICD-10-CM | POA: Diagnosis not present

## 2022-08-31 DIAGNOSIS — F03C3 Unspecified dementia, severe, with mood disturbance: Secondary | ICD-10-CM | POA: Diagnosis not present

## 2022-08-31 DIAGNOSIS — I5020 Unspecified systolic (congestive) heart failure: Secondary | ICD-10-CM | POA: Diagnosis not present

## 2022-08-31 DIAGNOSIS — F03C11 Unspecified dementia, severe, with agitation: Secondary | ICD-10-CM | POA: Diagnosis not present

## 2022-08-31 DIAGNOSIS — I110 Hypertensive heart disease with heart failure: Secondary | ICD-10-CM | POA: Diagnosis not present

## 2022-08-31 DIAGNOSIS — F32A Depression, unspecified: Secondary | ICD-10-CM | POA: Diagnosis not present

## 2022-08-31 DIAGNOSIS — I5032 Chronic diastolic (congestive) heart failure: Secondary | ICD-10-CM | POA: Diagnosis not present

## 2022-09-03 DIAGNOSIS — F32A Depression, unspecified: Secondary | ICD-10-CM | POA: Diagnosis not present

## 2022-09-03 DIAGNOSIS — I5032 Chronic diastolic (congestive) heart failure: Secondary | ICD-10-CM | POA: Diagnosis not present

## 2022-09-03 DIAGNOSIS — I5020 Unspecified systolic (congestive) heart failure: Secondary | ICD-10-CM | POA: Diagnosis not present

## 2022-09-03 DIAGNOSIS — F03C3 Unspecified dementia, severe, with mood disturbance: Secondary | ICD-10-CM | POA: Diagnosis not present

## 2022-09-03 DIAGNOSIS — F03C11 Unspecified dementia, severe, with agitation: Secondary | ICD-10-CM | POA: Diagnosis not present

## 2022-09-03 DIAGNOSIS — I110 Hypertensive heart disease with heart failure: Secondary | ICD-10-CM | POA: Diagnosis not present

## 2022-09-04 IMAGING — CT CT HEAD W/O CM
3 series · 14 of 47 positions shown, 16 images · non-contrast
Comparison: CT head 12/24/2019, 11/05/2019

CLINICAL DATA: Fall, head injury

EXAM:
CT HEAD WITHOUT CONTRAST
CT CERVICAL SPINE WITHOUT CONTRAST
TECHNIQUE: Multidetector CT imaging of the head and cervical spine was
performed following the standard protocol without intravenous
contrast. Multiplanar CT image reconstructions of the cervical spine
were also generated.

[Series 3: head wo · axial · 0.41mm/px · z∈[-152,-27]mm · 8 of 31 slices shown, 10 images]
[im 3/31  brain]
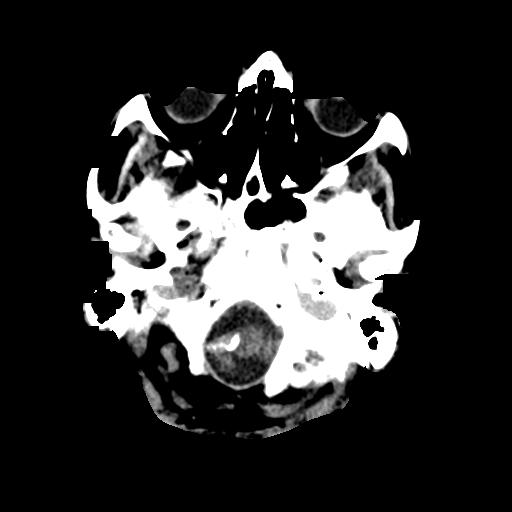
[im 3/31  bone]
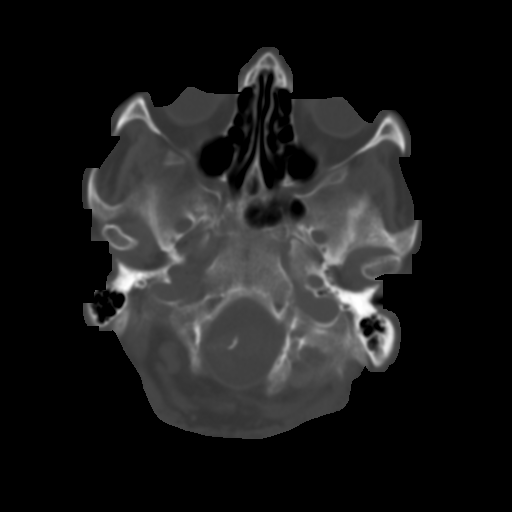
[im 7/31  brain]
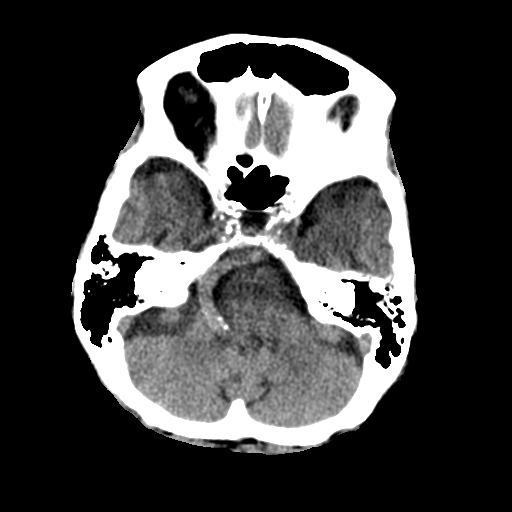
[im 10/31  brain]
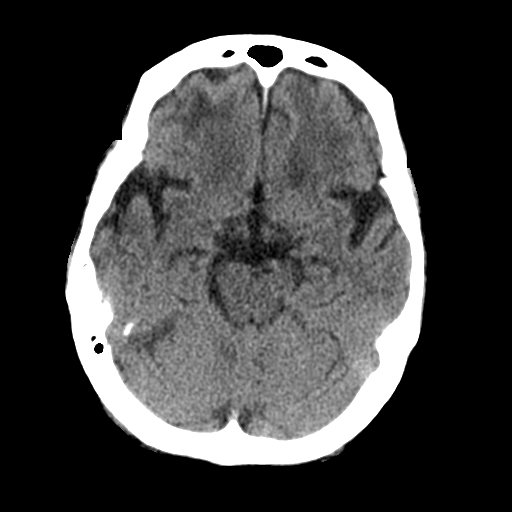
[im 14/31  brain]
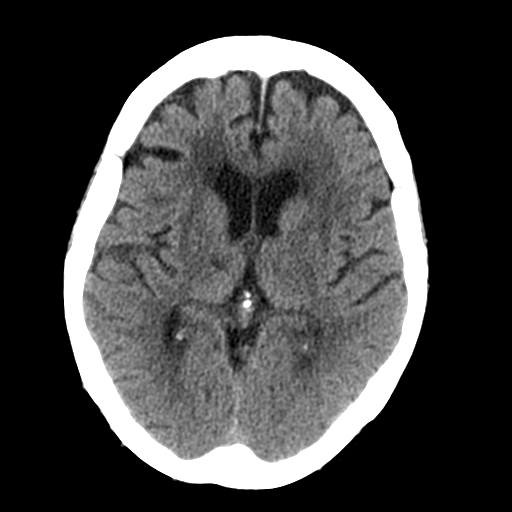
[im 17/31  brain]
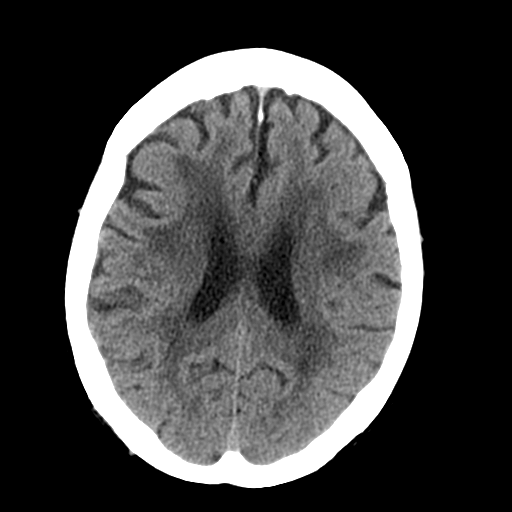
[im 17/31  bone]
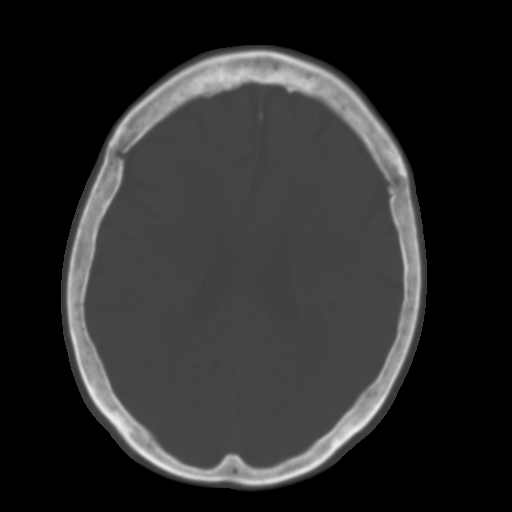
[im 21/31  brain]
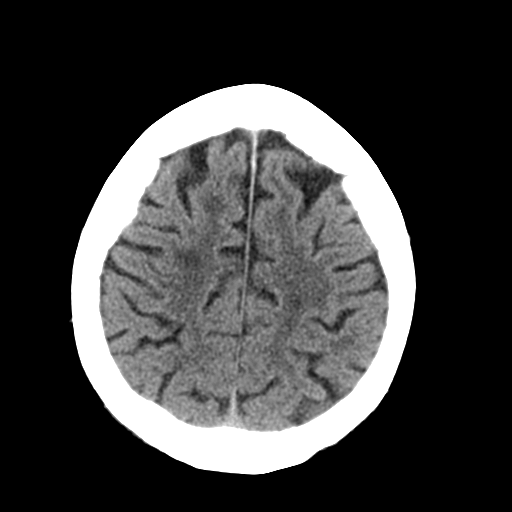
[im 24/31  brain]
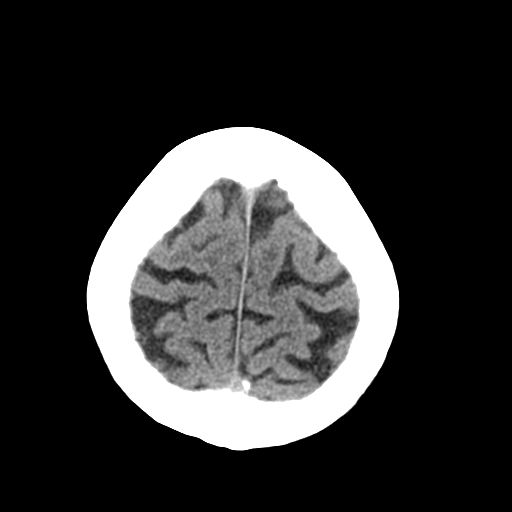
[im 28/31  brain]
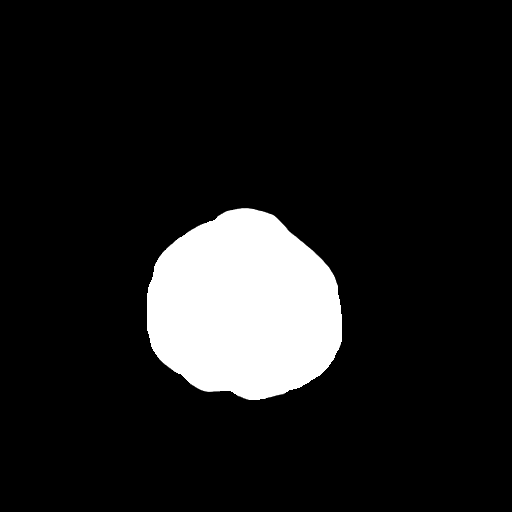

[Series 6: coronal soft tissue · coronal · 0.30mm/px · 3 of 67 slices shown]
[im 23/67  brain]
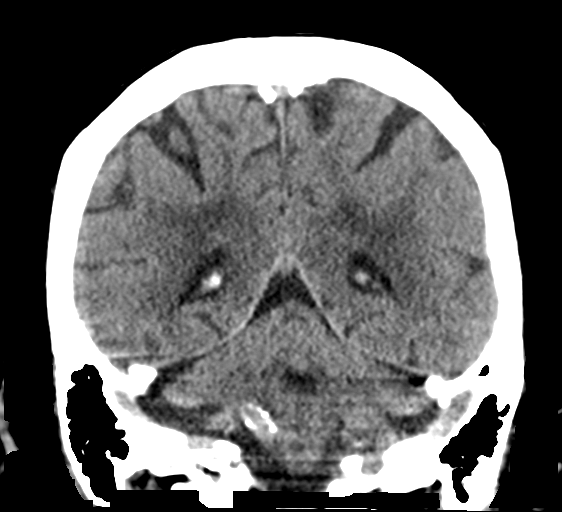
[im 30/67  brain]
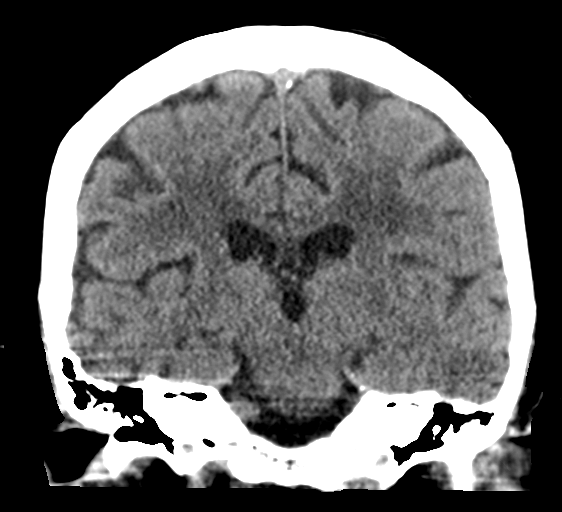
[im 37/67  brain]
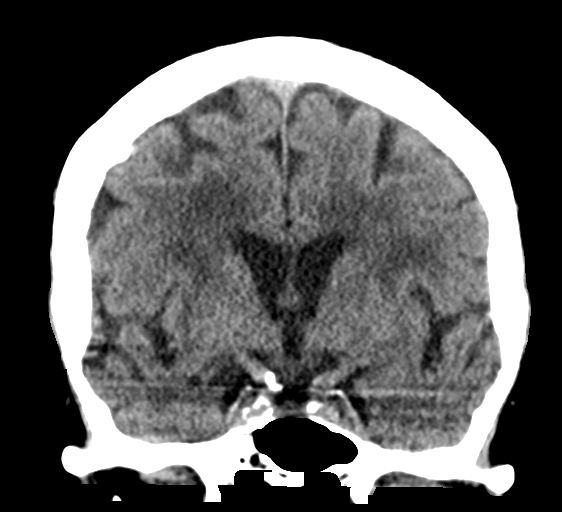

[Series 7: sagittal soft tissue · sagittal · 0.32mm/px · 3 of 53 slices shown]
[im 18/53  brain]
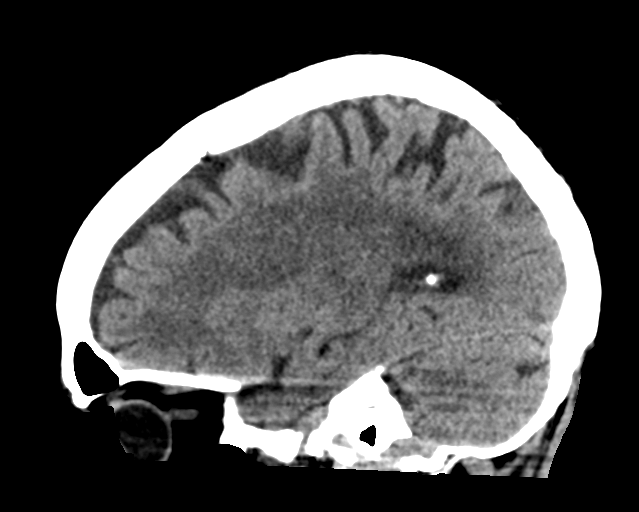
[im 27/53  brain]
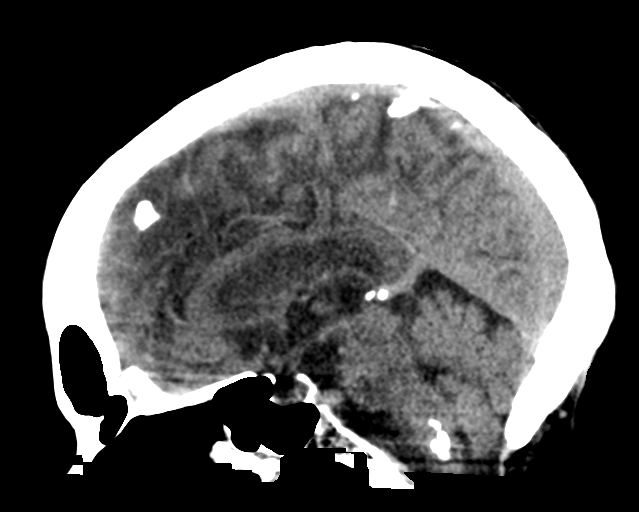
[im 35/53  brain]
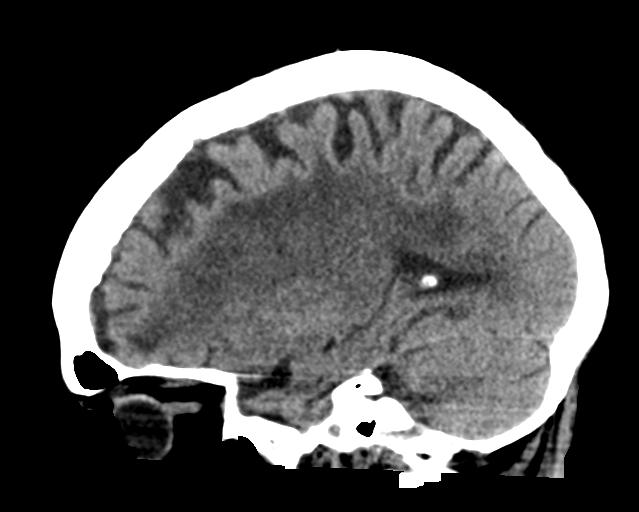

[14 of 47 positions shown; findings below may reference images not displayed]

FINDINGS: CT HEAD FINDINGS

Brain: Encephalomalacia within the right frontal lobe is again noted
secondary to remote contusion. Encephalomalacia within the temporal
lobe seen on prior examination is not as well appreciated on the
current exam. Remote infarcts within the right thalamus and left
cerebellar hemisphere again noted. Extensive, confluent bilateral
subcortical and periventricular white matter changes are again
identified, similar to that noted on prior examination, possibly
reflecting the sequela of small vessel ischemia. Mild parenchymal
volume loss is commensurate with the patient's age.

No evidence of acute intracranial hemorrhage or infarct. No abnormal
mass effect or midline shift. No abnormal intra or extra-axial mass
lesion or fluid collection. The cerebellum is unremarkable.

Vascular: No asymmetric hyperdense vasculature at the skull base

Skull: Intact

Sinuses/Orbits: The visualized paranasal sinuses are clear. The
orbits are unremarkable peer

Other: Mastoid air cells and middle ear cavities are clear.

CT CERVICAL SPINE FINDINGS

Alignment: Minimal anterolisthesis of C7 upon T1 is likely
degenerative in nature. Otherwise normal alignment of the cervical
spine

Skull base and vertebrae: . the craniocervical junction is
unremarkable. The atlantodental interval is normal. No acute
fracture of the cervical spine. No lytic or blastic bone lesions
identified.

Soft tissues and spinal canal: No prevertebral fluid or swelling. No
visible canal hematoma.

Disc levels: Review of the sagittal reformats demonstrates
preservation of vertebral body heights. There is mild intervertebral
disc space narrowing and endplate remodeling at C5-6 in keeping with
changes of mild degenerative disc disease. Remaining intervertebral
disc heights are preserved. Degenerative changes are noted with the
atlantodental articulation. Review of the axial images demonstrates
mild to moderate facet arthrosis on the left at C2-3, C3-4, C4-5
resulting in mild to moderate neural foraminal narrowing, most
severe on the left at C3-4. The spinal canal is widely patent.

Upper chest: The visualized lung apices are clear bilaterally.

Other: None significant
IMPRESSION: No acute intracranial injury.  No calvarial fracture.

No acute fracture of the cervical spine.

## 2022-09-05 DIAGNOSIS — F03C3 Unspecified dementia, severe, with mood disturbance: Secondary | ICD-10-CM | POA: Diagnosis not present

## 2022-09-05 DIAGNOSIS — I5020 Unspecified systolic (congestive) heart failure: Secondary | ICD-10-CM | POA: Diagnosis not present

## 2022-09-05 DIAGNOSIS — F03C11 Unspecified dementia, severe, with agitation: Secondary | ICD-10-CM | POA: Diagnosis not present

## 2022-09-05 DIAGNOSIS — F32A Depression, unspecified: Secondary | ICD-10-CM | POA: Diagnosis not present

## 2022-09-05 DIAGNOSIS — I110 Hypertensive heart disease with heart failure: Secondary | ICD-10-CM | POA: Diagnosis not present

## 2022-09-05 DIAGNOSIS — I5032 Chronic diastolic (congestive) heart failure: Secondary | ICD-10-CM | POA: Diagnosis not present

## 2022-09-06 DIAGNOSIS — F03C11 Unspecified dementia, severe, with agitation: Secondary | ICD-10-CM | POA: Diagnosis not present

## 2022-09-06 DIAGNOSIS — I110 Hypertensive heart disease with heart failure: Secondary | ICD-10-CM | POA: Diagnosis not present

## 2022-09-06 DIAGNOSIS — F03C3 Unspecified dementia, severe, with mood disturbance: Secondary | ICD-10-CM | POA: Diagnosis not present

## 2022-09-06 DIAGNOSIS — I5032 Chronic diastolic (congestive) heart failure: Secondary | ICD-10-CM | POA: Diagnosis not present

## 2022-09-06 DIAGNOSIS — I5020 Unspecified systolic (congestive) heart failure: Secondary | ICD-10-CM | POA: Diagnosis not present

## 2022-09-06 DIAGNOSIS — F32A Depression, unspecified: Secondary | ICD-10-CM | POA: Diagnosis not present

## 2022-09-11 DIAGNOSIS — I5020 Unspecified systolic (congestive) heart failure: Secondary | ICD-10-CM | POA: Diagnosis not present

## 2022-09-11 DIAGNOSIS — F03C3 Unspecified dementia, severe, with mood disturbance: Secondary | ICD-10-CM | POA: Diagnosis not present

## 2022-09-11 DIAGNOSIS — F03C11 Unspecified dementia, severe, with agitation: Secondary | ICD-10-CM | POA: Diagnosis not present

## 2022-09-11 DIAGNOSIS — I5032 Chronic diastolic (congestive) heart failure: Secondary | ICD-10-CM | POA: Diagnosis not present

## 2022-09-11 DIAGNOSIS — I110 Hypertensive heart disease with heart failure: Secondary | ICD-10-CM | POA: Diagnosis not present

## 2022-09-11 DIAGNOSIS — F32A Depression, unspecified: Secondary | ICD-10-CM | POA: Diagnosis not present

## 2022-09-11 DIAGNOSIS — I2111 ST elevation (STEMI) myocardial infarction involving right coronary artery: Secondary | ICD-10-CM | POA: Diagnosis not present

## 2022-09-12 DIAGNOSIS — F32A Depression, unspecified: Secondary | ICD-10-CM | POA: Diagnosis not present

## 2022-09-12 DIAGNOSIS — Z853 Personal history of malignant neoplasm of breast: Secondary | ICD-10-CM | POA: Diagnosis not present

## 2022-09-12 DIAGNOSIS — I5020 Unspecified systolic (congestive) heart failure: Secondary | ICD-10-CM | POA: Diagnosis not present

## 2022-09-12 DIAGNOSIS — M810 Age-related osteoporosis without current pathological fracture: Secondary | ICD-10-CM | POA: Diagnosis not present

## 2022-09-12 DIAGNOSIS — I5032 Chronic diastolic (congestive) heart failure: Secondary | ICD-10-CM | POA: Diagnosis not present

## 2022-09-12 DIAGNOSIS — F03C3 Unspecified dementia, severe, with mood disturbance: Secondary | ICD-10-CM | POA: Diagnosis not present

## 2022-09-12 DIAGNOSIS — I110 Hypertensive heart disease with heart failure: Secondary | ICD-10-CM | POA: Diagnosis not present

## 2022-09-12 DIAGNOSIS — M79604 Pain in right leg: Secondary | ICD-10-CM | POA: Diagnosis not present

## 2022-09-12 DIAGNOSIS — I480 Paroxysmal atrial fibrillation: Secondary | ICD-10-CM | POA: Diagnosis not present

## 2022-09-12 DIAGNOSIS — F03C11 Unspecified dementia, severe, with agitation: Secondary | ICD-10-CM | POA: Diagnosis not present

## 2022-09-12 DIAGNOSIS — E785 Hyperlipidemia, unspecified: Secondary | ICD-10-CM | POA: Diagnosis not present

## 2022-09-12 DIAGNOSIS — K219 Gastro-esophageal reflux disease without esophagitis: Secondary | ICD-10-CM | POA: Diagnosis not present

## 2022-09-13 DIAGNOSIS — F03C11 Unspecified dementia, severe, with agitation: Secondary | ICD-10-CM | POA: Diagnosis not present

## 2022-09-13 DIAGNOSIS — I110 Hypertensive heart disease with heart failure: Secondary | ICD-10-CM | POA: Diagnosis not present

## 2022-09-13 DIAGNOSIS — F32A Depression, unspecified: Secondary | ICD-10-CM | POA: Diagnosis not present

## 2022-09-13 DIAGNOSIS — I5020 Unspecified systolic (congestive) heart failure: Secondary | ICD-10-CM | POA: Diagnosis not present

## 2022-09-13 DIAGNOSIS — F03C3 Unspecified dementia, severe, with mood disturbance: Secondary | ICD-10-CM | POA: Diagnosis not present

## 2022-09-13 DIAGNOSIS — I5032 Chronic diastolic (congestive) heart failure: Secondary | ICD-10-CM | POA: Diagnosis not present

## 2022-09-18 IMAGING — CR DG CHEST 2V
2 series · 2 of 2 positions shown · non-contrast
Comparison: 11/05/2019, 11/11/2019

CLINICAL DATA: Chest pain, shortness of breath

EXAM:
CHEST - 2 VIEW

[chest lat]
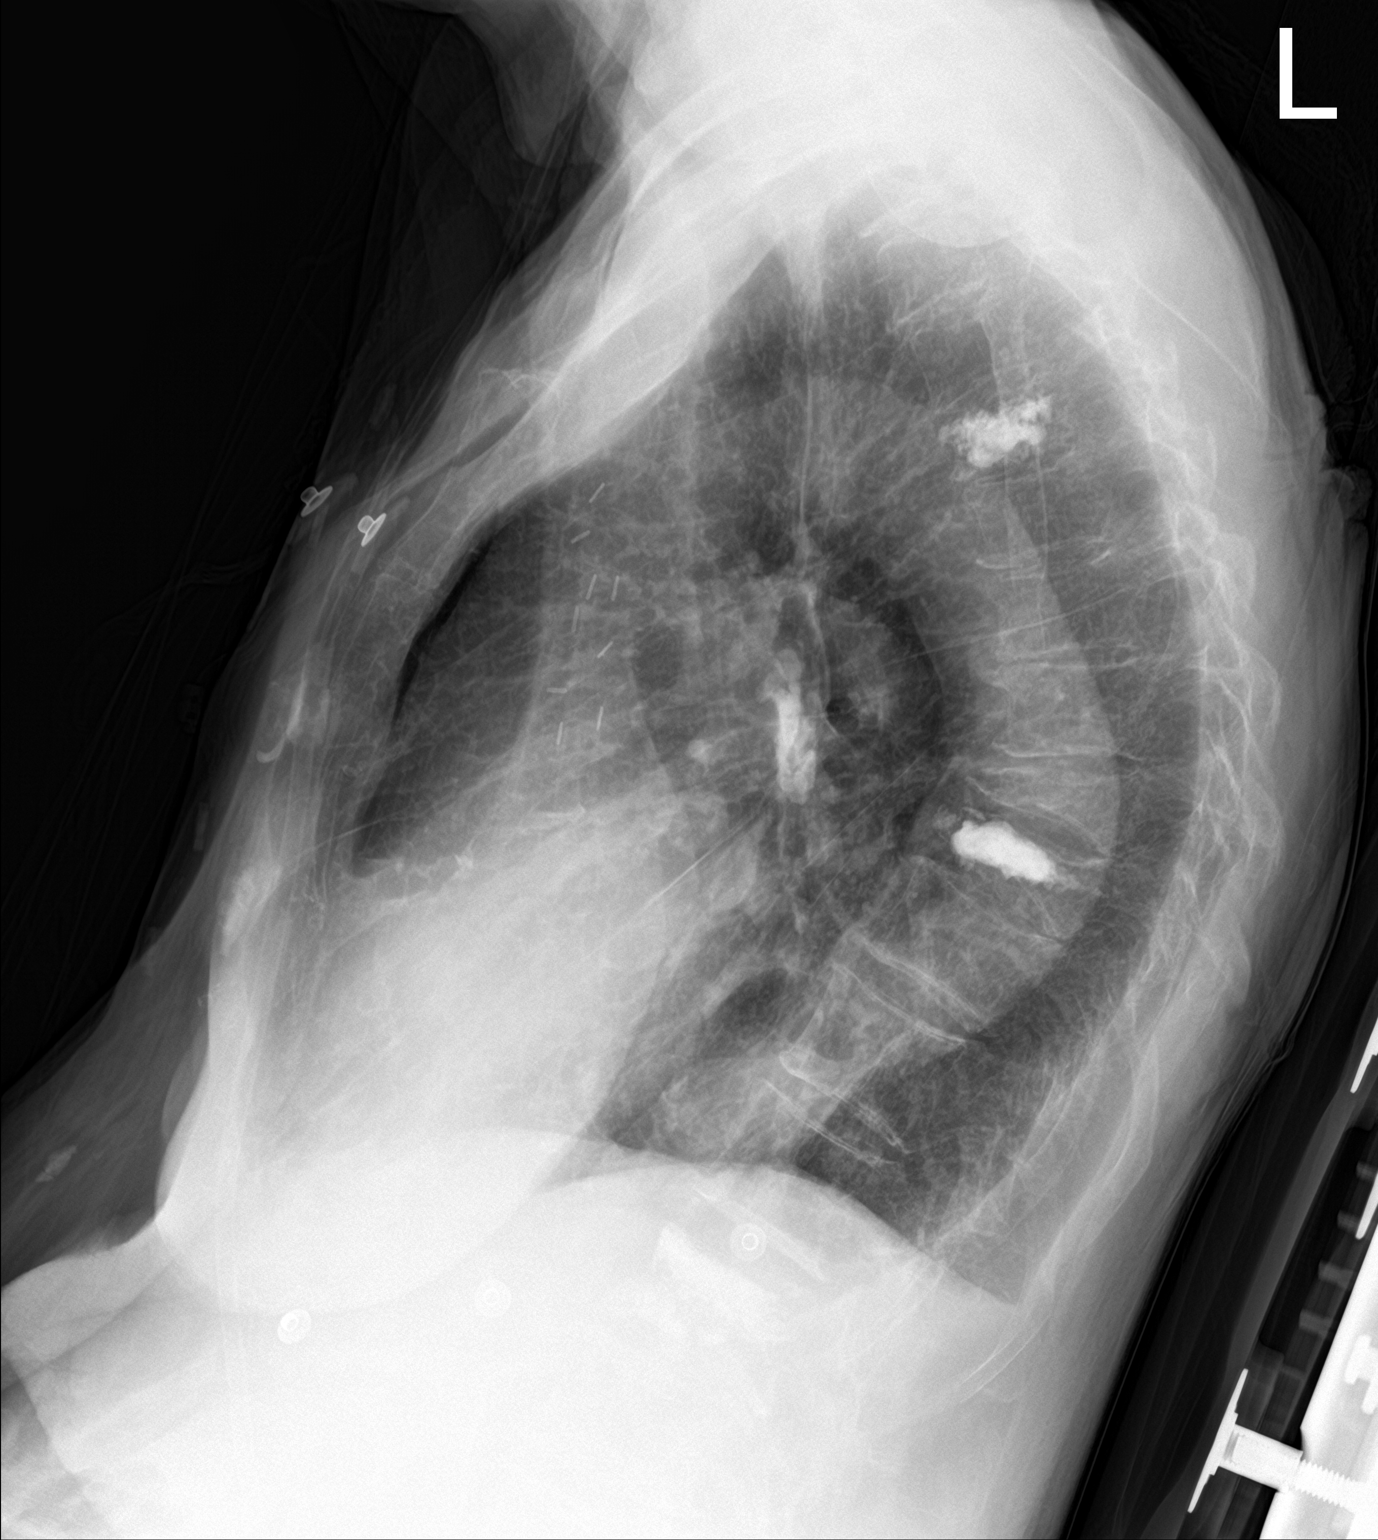

[chest ap]
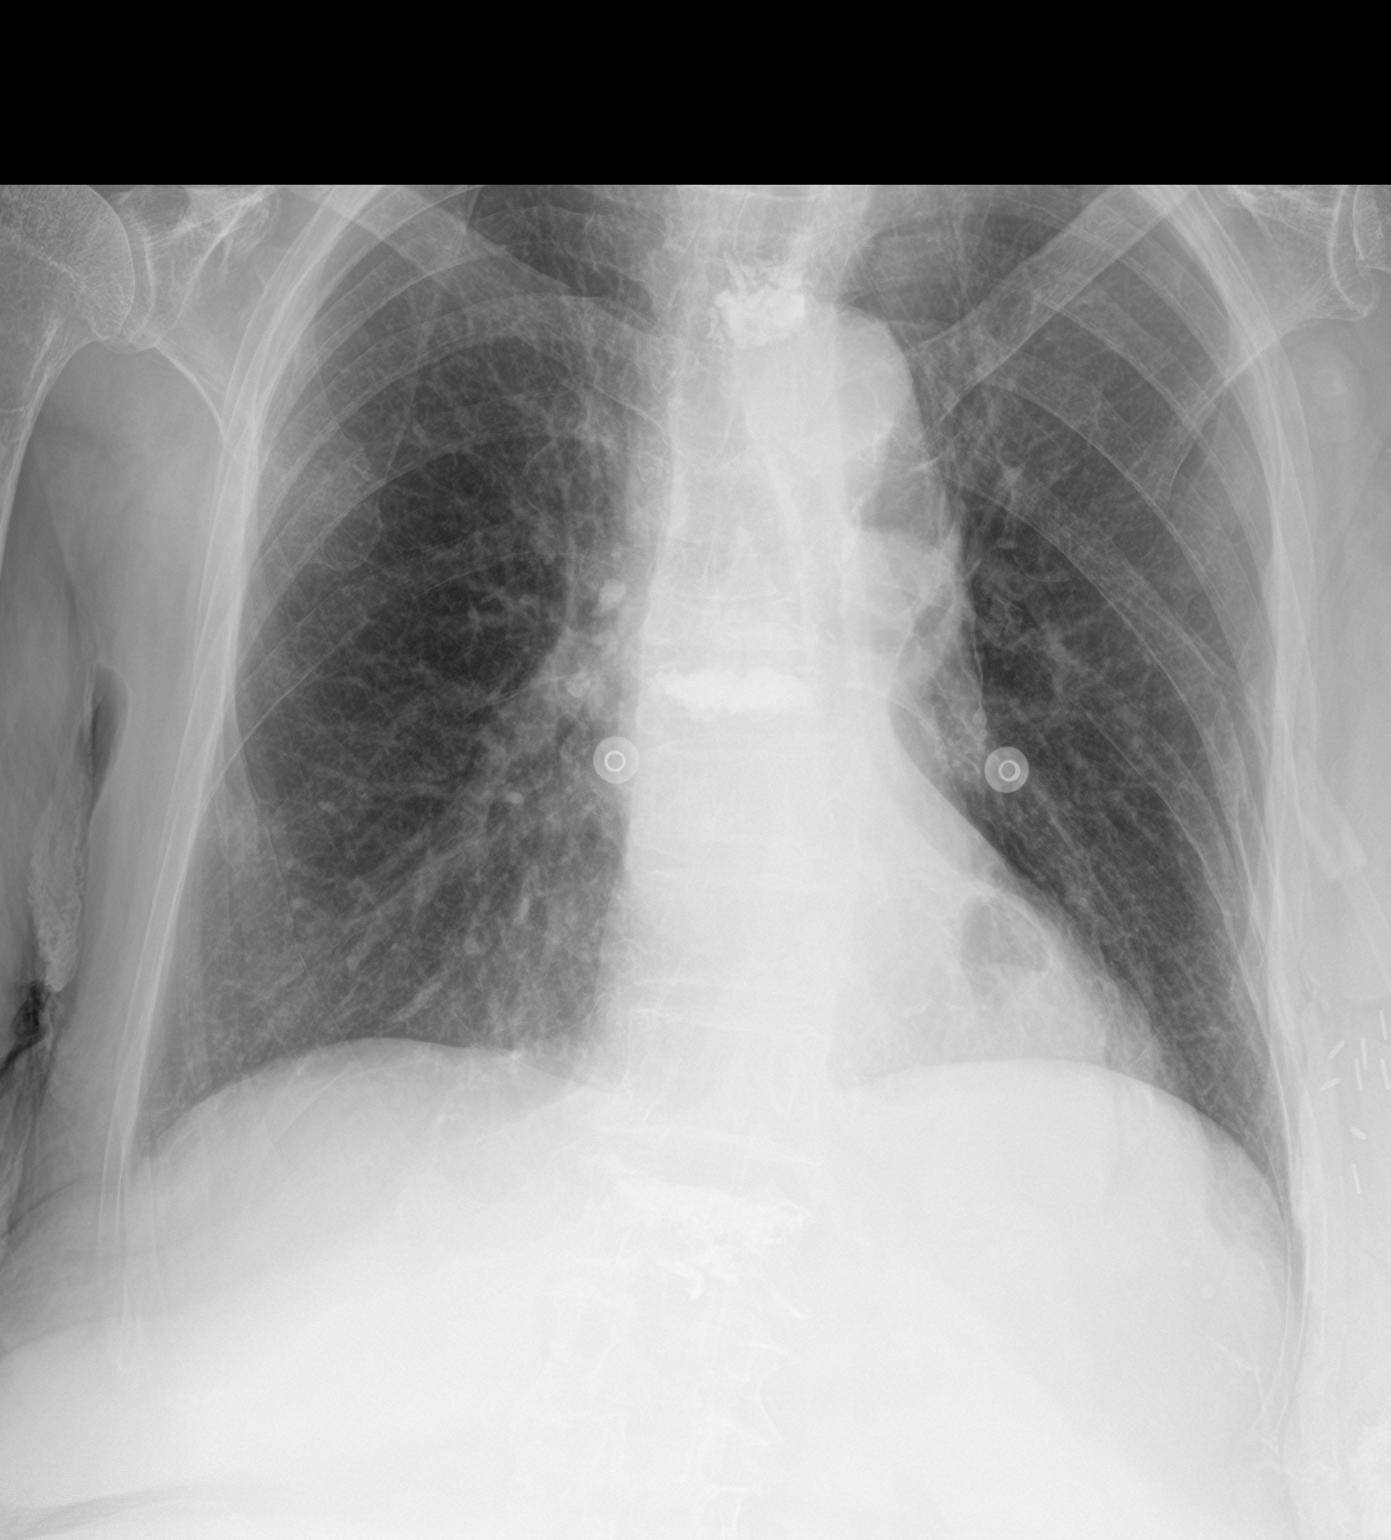

[2 of 2 positions shown; findings below may reference images not displayed]

FINDINGS: Heart size is normal. Hiatal hernia. No focal airspace
consolidation, pleural effusion, or pneumothorax. Suspect
nondisplaced lateral right sixth rib fracture. Numerous lateral
left-sided rib fractures. Numerous thoracic vertebral body
compression fractures with cement augmentation at T4 and T9. New
severe compression fracture of T8.
IMPRESSION: 1. No acute cardiopulmonary findings.
2. New severe compression fracture of T8.
3. Suspect nondisplaced lateral right sixth rib fracture, age
indeterminate.
4. Numerous lateral left-sided rib fractures. These were present on
the previous study 11/11/2019.

## 2022-09-19 ENCOUNTER — Telehealth: Payer: Self-pay

## 2022-09-19 DIAGNOSIS — F32A Depression, unspecified: Secondary | ICD-10-CM | POA: Diagnosis not present

## 2022-09-19 DIAGNOSIS — F03C3 Unspecified dementia, severe, with mood disturbance: Secondary | ICD-10-CM | POA: Diagnosis not present

## 2022-09-19 DIAGNOSIS — F03C11 Unspecified dementia, severe, with agitation: Secondary | ICD-10-CM | POA: Diagnosis not present

## 2022-09-19 DIAGNOSIS — I110 Hypertensive heart disease with heart failure: Secondary | ICD-10-CM | POA: Diagnosis not present

## 2022-09-19 DIAGNOSIS — I5032 Chronic diastolic (congestive) heart failure: Secondary | ICD-10-CM | POA: Diagnosis not present

## 2022-09-19 DIAGNOSIS — I5020 Unspecified systolic (congestive) heart failure: Secondary | ICD-10-CM | POA: Diagnosis not present

## 2022-09-19 NOTE — Telephone Encounter (Signed)
Received call from pt's daughter/POA [name] reporting that pt has had another "big" fall with multiple fractures in March. Pt is currently in SNF for rehab. Per [name]'s pt's dementia continues to worsen and her general condition is progressively declining. [name] is questioning if it would be detrimental to pt's overall health to cancel appts for now.   Per Dr Myna Hidalgo, ok to cancel tomorrow's appt with no plan for f/u. [name] is aware to contact our office prn. dph

## 2022-09-20 ENCOUNTER — Inpatient Hospital Stay: Payer: Medicare Other

## 2022-09-20 ENCOUNTER — Inpatient Hospital Stay: Payer: Medicare Other | Admitting: Hematology & Oncology

## 2022-09-20 DIAGNOSIS — L729 Follicular cyst of the skin and subcutaneous tissue, unspecified: Secondary | ICD-10-CM | POA: Diagnosis not present

## 2022-09-20 DIAGNOSIS — B078 Other viral warts: Secondary | ICD-10-CM | POA: Diagnosis not present

## 2022-09-23 DIAGNOSIS — S8990XA Unspecified injury of unspecified lower leg, initial encounter: Secondary | ICD-10-CM | POA: Diagnosis not present

## 2022-09-23 DIAGNOSIS — M25521 Pain in right elbow: Secondary | ICD-10-CM | POA: Diagnosis not present

## 2022-09-23 DIAGNOSIS — S42291D Other displaced fracture of upper end of right humerus, subsequent encounter for fracture with routine healing: Secondary | ICD-10-CM | POA: Diagnosis not present

## 2022-09-23 DIAGNOSIS — Z043 Encounter for examination and observation following other accident: Secondary | ICD-10-CM | POA: Diagnosis not present

## 2022-09-23 DIAGNOSIS — R2689 Other abnormalities of gait and mobility: Secondary | ICD-10-CM | POA: Diagnosis not present

## 2022-09-23 DIAGNOSIS — Z7901 Long term (current) use of anticoagulants: Secondary | ICD-10-CM | POA: Diagnosis not present

## 2022-09-23 DIAGNOSIS — F17210 Nicotine dependence, cigarettes, uncomplicated: Secondary | ICD-10-CM | POA: Diagnosis not present

## 2022-09-23 DIAGNOSIS — R0781 Pleurodynia: Secondary | ICD-10-CM | POA: Diagnosis not present

## 2022-09-23 DIAGNOSIS — M6281 Muscle weakness (generalized): Secondary | ICD-10-CM | POA: Diagnosis not present

## 2022-09-23 DIAGNOSIS — S81811A Laceration without foreign body, right lower leg, initial encounter: Secondary | ICD-10-CM | POA: Diagnosis not present

## 2022-09-23 DIAGNOSIS — K449 Diaphragmatic hernia without obstruction or gangrene: Secondary | ICD-10-CM | POA: Diagnosis not present

## 2022-09-23 DIAGNOSIS — M25551 Pain in right hip: Secondary | ICD-10-CM | POA: Diagnosis not present

## 2022-09-23 DIAGNOSIS — M25511 Pain in right shoulder: Secondary | ICD-10-CM | POA: Diagnosis not present

## 2022-09-23 DIAGNOSIS — S72141D Displaced intertrochanteric fracture of right femur, subsequent encounter for closed fracture with routine healing: Secondary | ICD-10-CM | POA: Diagnosis not present

## 2022-09-23 DIAGNOSIS — Z743 Need for continuous supervision: Secondary | ICD-10-CM | POA: Diagnosis not present

## 2022-09-23 DIAGNOSIS — Z9181 History of falling: Secondary | ICD-10-CM | POA: Diagnosis not present

## 2022-09-23 DIAGNOSIS — S51011A Laceration without foreign body of right elbow, initial encounter: Secondary | ICD-10-CM | POA: Diagnosis not present

## 2022-09-23 DIAGNOSIS — S42291A Other displaced fracture of upper end of right humerus, initial encounter for closed fracture: Secondary | ICD-10-CM | POA: Diagnosis not present

## 2022-09-23 DIAGNOSIS — Z8781 Personal history of (healed) traumatic fracture: Secondary | ICD-10-CM | POA: Diagnosis not present

## 2022-09-23 DIAGNOSIS — S0990XA Unspecified injury of head, initial encounter: Secondary | ICD-10-CM | POA: Diagnosis not present

## 2022-09-23 DIAGNOSIS — W19XXXA Unspecified fall, initial encounter: Secondary | ICD-10-CM | POA: Diagnosis not present

## 2022-09-24 DIAGNOSIS — K449 Diaphragmatic hernia without obstruction or gangrene: Secondary | ICD-10-CM | POA: Diagnosis not present

## 2022-09-24 DIAGNOSIS — Z7401 Bed confinement status: Secondary | ICD-10-CM | POA: Diagnosis not present

## 2022-09-24 DIAGNOSIS — S0990XA Unspecified injury of head, initial encounter: Secondary | ICD-10-CM | POA: Diagnosis not present

## 2022-09-24 DIAGNOSIS — Z743 Need for continuous supervision: Secondary | ICD-10-CM | POA: Diagnosis not present

## 2022-09-24 DIAGNOSIS — Z043 Encounter for examination and observation following other accident: Secondary | ICD-10-CM | POA: Diagnosis not present

## 2022-09-24 DIAGNOSIS — R531 Weakness: Secondary | ICD-10-CM | POA: Diagnosis not present

## 2022-09-24 DIAGNOSIS — S42291A Other displaced fracture of upper end of right humerus, initial encounter for closed fracture: Secondary | ICD-10-CM | POA: Diagnosis not present

## 2022-09-25 DIAGNOSIS — I110 Hypertensive heart disease with heart failure: Secondary | ICD-10-CM | POA: Diagnosis not present

## 2022-09-25 DIAGNOSIS — F03C3 Unspecified dementia, severe, with mood disturbance: Secondary | ICD-10-CM | POA: Diagnosis not present

## 2022-09-25 DIAGNOSIS — F03C11 Unspecified dementia, severe, with agitation: Secondary | ICD-10-CM | POA: Diagnosis not present

## 2022-09-25 DIAGNOSIS — I5032 Chronic diastolic (congestive) heart failure: Secondary | ICD-10-CM | POA: Diagnosis not present

## 2022-09-25 DIAGNOSIS — I5020 Unspecified systolic (congestive) heart failure: Secondary | ICD-10-CM | POA: Diagnosis not present

## 2022-09-25 DIAGNOSIS — F32A Depression, unspecified: Secondary | ICD-10-CM | POA: Diagnosis not present

## 2022-09-26 DIAGNOSIS — W19XXXA Unspecified fall, initial encounter: Secondary | ICD-10-CM | POA: Diagnosis not present

## 2022-09-26 DIAGNOSIS — I480 Paroxysmal atrial fibrillation: Secondary | ICD-10-CM | POA: Diagnosis not present

## 2022-09-26 DIAGNOSIS — Z7901 Long term (current) use of anticoagulants: Secondary | ICD-10-CM | POA: Diagnosis not present

## 2022-09-26 DIAGNOSIS — I10 Essential (primary) hypertension: Secondary | ICD-10-CM | POA: Diagnosis not present

## 2022-09-28 ENCOUNTER — Emergency Department (HOSPITAL_COMMUNITY): Payer: Medicare Other

## 2022-09-28 ENCOUNTER — Inpatient Hospital Stay (HOSPITAL_COMMUNITY)
Admission: EM | Admit: 2022-09-28 | Discharge: 2022-10-09 | DRG: 082 | Disposition: A | Discharge status: Skilled Nursing Facility | Payer: Medicare Other | Source: Skilled Nursing Facility | Attending: Student | Admitting: Student

## 2022-09-28 ENCOUNTER — Encounter (HOSPITAL_COMMUNITY): Payer: Self-pay

## 2022-09-28 DIAGNOSIS — M40204 Unspecified kyphosis, thoracic region: Secondary | ICD-10-CM | POA: Diagnosis not present

## 2022-09-28 DIAGNOSIS — F17210 Nicotine dependence, cigarettes, uncomplicated: Secondary | ICD-10-CM | POA: Diagnosis not present

## 2022-09-28 DIAGNOSIS — E785 Hyperlipidemia, unspecified: Secondary | ICD-10-CM | POA: Diagnosis not present

## 2022-09-28 DIAGNOSIS — W19XXXA Unspecified fall, initial encounter: Secondary | ICD-10-CM | POA: Diagnosis not present

## 2022-09-28 DIAGNOSIS — Z743 Need for continuous supervision: Secondary | ICD-10-CM | POA: Diagnosis not present

## 2022-09-28 DIAGNOSIS — Z7902 Long term (current) use of antithrombotics/antiplatelets: Secondary | ICD-10-CM

## 2022-09-28 DIAGNOSIS — S22030A Wedge compression fracture of third thoracic vertebra, initial encounter for closed fracture: Secondary | ICD-10-CM | POA: Diagnosis present

## 2022-09-28 DIAGNOSIS — I5032 Chronic diastolic (congestive) heart failure: Secondary | ICD-10-CM | POA: Diagnosis not present

## 2022-09-28 DIAGNOSIS — R069 Unspecified abnormalities of breathing: Secondary | ICD-10-CM | POA: Diagnosis not present

## 2022-09-28 DIAGNOSIS — C50011 Malignant neoplasm of nipple and areola, right female breast: Secondary | ICD-10-CM | POA: Diagnosis not present

## 2022-09-28 DIAGNOSIS — F03C3 Unspecified dementia, severe, with mood disturbance: Secondary | ICD-10-CM | POA: Diagnosis not present

## 2022-09-28 DIAGNOSIS — F419 Anxiety disorder, unspecified: Secondary | ICD-10-CM | POA: Diagnosis present

## 2022-09-28 DIAGNOSIS — E876 Hypokalemia: Secondary | ICD-10-CM | POA: Diagnosis present

## 2022-09-28 DIAGNOSIS — I10 Essential (primary) hypertension: Secondary | ICD-10-CM | POA: Diagnosis not present

## 2022-09-28 DIAGNOSIS — M4852XA Collapsed vertebra, not elsewhere classified, cervical region, initial encounter for fracture: Secondary | ICD-10-CM | POA: Diagnosis present

## 2022-09-28 DIAGNOSIS — Z96652 Presence of left artificial knee joint: Secondary | ICD-10-CM | POA: Diagnosis present

## 2022-09-28 DIAGNOSIS — D509 Iron deficiency anemia, unspecified: Secondary | ICD-10-CM | POA: Diagnosis present

## 2022-09-28 DIAGNOSIS — Z7901 Long term (current) use of anticoagulants: Secondary | ICD-10-CM

## 2022-09-28 DIAGNOSIS — S22039A Unspecified fracture of third thoracic vertebra, initial encounter for closed fracture: Secondary | ICD-10-CM | POA: Diagnosis present

## 2022-09-28 DIAGNOSIS — Z7983 Long term (current) use of bisphosphonates: Secondary | ICD-10-CM

## 2022-09-28 DIAGNOSIS — M4854XA Collapsed vertebra, not elsewhere classified, thoracic region, initial encounter for fracture: Secondary | ICD-10-CM | POA: Diagnosis present

## 2022-09-28 DIAGNOSIS — C50919 Malignant neoplasm of unspecified site of unspecified female breast: Secondary | ICD-10-CM | POA: Diagnosis not present

## 2022-09-28 DIAGNOSIS — Z8673 Personal history of transient ischemic attack (TIA), and cerebral infarction without residual deficits: Secondary | ICD-10-CM

## 2022-09-28 DIAGNOSIS — I2510 Atherosclerotic heart disease of native coronary artery without angina pectoris: Secondary | ICD-10-CM | POA: Diagnosis present

## 2022-09-28 DIAGNOSIS — Z515 Encounter for palliative care: Secondary | ICD-10-CM | POA: Diagnosis not present

## 2022-09-28 DIAGNOSIS — Z9181 History of falling: Secondary | ICD-10-CM | POA: Diagnosis not present

## 2022-09-28 DIAGNOSIS — K219 Gastro-esophageal reflux disease without esophagitis: Secondary | ICD-10-CM | POA: Diagnosis present

## 2022-09-28 DIAGNOSIS — I110 Hypertensive heart disease with heart failure: Secondary | ICD-10-CM | POA: Diagnosis not present

## 2022-09-28 DIAGNOSIS — I480 Paroxysmal atrial fibrillation: Secondary | ICD-10-CM | POA: Diagnosis present

## 2022-09-28 DIAGNOSIS — E43 Unspecified severe protein-calorie malnutrition: Secondary | ICD-10-CM | POA: Diagnosis present

## 2022-09-28 DIAGNOSIS — S81801A Unspecified open wound, right lower leg, initial encounter: Secondary | ICD-10-CM | POA: Diagnosis not present

## 2022-09-28 DIAGNOSIS — Z955 Presence of coronary angioplasty implant and graft: Secondary | ICD-10-CM | POA: Diagnosis not present

## 2022-09-28 DIAGNOSIS — W19XXXD Unspecified fall, subsequent encounter: Secondary | ICD-10-CM | POA: Diagnosis not present

## 2022-09-28 DIAGNOSIS — F32A Depression, unspecified: Secondary | ICD-10-CM | POA: Diagnosis not present

## 2022-09-28 DIAGNOSIS — J9601 Acute respiratory failure with hypoxia: Secondary | ICD-10-CM | POA: Diagnosis present

## 2022-09-28 DIAGNOSIS — R531 Weakness: Secondary | ICD-10-CM | POA: Diagnosis not present

## 2022-09-28 DIAGNOSIS — R0902 Hypoxemia: Secondary | ICD-10-CM

## 2022-09-28 DIAGNOSIS — S12690A Other displaced fracture of seventh cervical vertebra, initial encounter for closed fracture: Secondary | ICD-10-CM | POA: Diagnosis present

## 2022-09-28 DIAGNOSIS — R011 Cardiac murmur, unspecified: Secondary | ICD-10-CM | POA: Diagnosis not present

## 2022-09-28 DIAGNOSIS — H9203 Otalgia, bilateral: Secondary | ICD-10-CM | POA: Diagnosis present

## 2022-09-28 DIAGNOSIS — Z86711 Personal history of pulmonary embolism: Secondary | ICD-10-CM

## 2022-09-28 DIAGNOSIS — G8929 Other chronic pain: Secondary | ICD-10-CM | POA: Diagnosis not present

## 2022-09-28 DIAGNOSIS — S065XAD Traumatic subdural hemorrhage with loss of consciousness status unknown, subsequent encounter: Secondary | ICD-10-CM | POA: Diagnosis not present

## 2022-09-28 DIAGNOSIS — Z803 Family history of malignant neoplasm of breast: Secondary | ICD-10-CM

## 2022-09-28 DIAGNOSIS — Z880 Allergy status to penicillin: Secondary | ICD-10-CM

## 2022-09-28 DIAGNOSIS — R2689 Other abnormalities of gait and mobility: Secondary | ICD-10-CM | POA: Diagnosis not present

## 2022-09-28 DIAGNOSIS — D500 Iron deficiency anemia secondary to blood loss (chronic): Secondary | ICD-10-CM | POA: Diagnosis not present

## 2022-09-28 DIAGNOSIS — D72829 Elevated white blood cell count, unspecified: Secondary | ICD-10-CM | POA: Diagnosis not present

## 2022-09-28 DIAGNOSIS — I2699 Other pulmonary embolism without acute cor pulmonale: Secondary | ICD-10-CM | POA: Diagnosis not present

## 2022-09-28 DIAGNOSIS — S0990XA Unspecified injury of head, initial encounter: Secondary | ICD-10-CM | POA: Diagnosis not present

## 2022-09-28 DIAGNOSIS — R296 Repeated falls: Secondary | ICD-10-CM | POA: Diagnosis not present

## 2022-09-28 DIAGNOSIS — Z853 Personal history of malignant neoplasm of breast: Secondary | ICD-10-CM

## 2022-09-28 DIAGNOSIS — S199XXA Unspecified injury of neck, initial encounter: Secondary | ICD-10-CM | POA: Diagnosis not present

## 2022-09-28 DIAGNOSIS — E871 Hypo-osmolality and hyponatremia: Secondary | ICD-10-CM | POA: Diagnosis not present

## 2022-09-28 DIAGNOSIS — F418 Other specified anxiety disorders: Secondary | ICD-10-CM | POA: Diagnosis not present

## 2022-09-28 DIAGNOSIS — Z886 Allergy status to analgesic agent status: Secondary | ICD-10-CM

## 2022-09-28 DIAGNOSIS — M549 Dorsalgia, unspecified: Secondary | ICD-10-CM | POA: Diagnosis not present

## 2022-09-28 DIAGNOSIS — R0789 Other chest pain: Secondary | ICD-10-CM | POA: Diagnosis not present

## 2022-09-28 DIAGNOSIS — I5181 Takotsubo syndrome: Secondary | ICD-10-CM | POA: Diagnosis present

## 2022-09-28 DIAGNOSIS — S22069A Unspecified fracture of T7-T8 vertebra, initial encounter for closed fracture: Secondary | ICD-10-CM | POA: Diagnosis not present

## 2022-09-28 DIAGNOSIS — S2231XA Fracture of one rib, right side, initial encounter for closed fracture: Secondary | ICD-10-CM | POA: Diagnosis not present

## 2022-09-28 DIAGNOSIS — Z888 Allergy status to other drugs, medicaments and biological substances status: Secondary | ICD-10-CM

## 2022-09-28 DIAGNOSIS — Z7401 Bed confinement status: Secondary | ICD-10-CM | POA: Diagnosis not present

## 2022-09-28 DIAGNOSIS — I428 Other cardiomyopathies: Secondary | ICD-10-CM | POA: Diagnosis not present

## 2022-09-28 DIAGNOSIS — I252 Old myocardial infarction: Secondary | ICD-10-CM | POA: Diagnosis not present

## 2022-09-28 DIAGNOSIS — I429 Cardiomyopathy, unspecified: Secondary | ICD-10-CM | POA: Diagnosis present

## 2022-09-28 DIAGNOSIS — Z806 Family history of leukemia: Secondary | ICD-10-CM

## 2022-09-28 DIAGNOSIS — M6281 Muscle weakness (generalized): Secondary | ICD-10-CM | POA: Diagnosis not present

## 2022-09-28 DIAGNOSIS — S12600A Unspecified displaced fracture of seventh cervical vertebra, initial encounter for closed fracture: Secondary | ICD-10-CM | POA: Diagnosis present

## 2022-09-28 DIAGNOSIS — I7300 Raynaud's syndrome without gangrene: Secondary | ICD-10-CM | POA: Diagnosis not present

## 2022-09-28 DIAGNOSIS — S3993XA Unspecified injury of pelvis, initial encounter: Secondary | ICD-10-CM | POA: Diagnosis not present

## 2022-09-28 DIAGNOSIS — S299XXA Unspecified injury of thorax, initial encounter: Secondary | ICD-10-CM | POA: Diagnosis not present

## 2022-09-28 DIAGNOSIS — D751 Secondary polycythemia: Secondary | ICD-10-CM | POA: Diagnosis not present

## 2022-09-28 DIAGNOSIS — F17200 Nicotine dependence, unspecified, uncomplicated: Secondary | ICD-10-CM | POA: Diagnosis not present

## 2022-09-28 DIAGNOSIS — Z8249 Family history of ischemic heart disease and other diseases of the circulatory system: Secondary | ICD-10-CM

## 2022-09-28 DIAGNOSIS — Z7189 Other specified counseling: Secondary | ICD-10-CM | POA: Diagnosis not present

## 2022-09-28 DIAGNOSIS — M25511 Pain in right shoulder: Secondary | ICD-10-CM | POA: Diagnosis present

## 2022-09-28 DIAGNOSIS — Z539 Procedure and treatment not carried out, unspecified reason: Secondary | ICD-10-CM | POA: Diagnosis not present

## 2022-09-28 DIAGNOSIS — Z79899 Other long term (current) drug therapy: Secondary | ICD-10-CM

## 2022-09-28 DIAGNOSIS — I6381 Other cerebral infarction due to occlusion or stenosis of small artery: Secondary | ICD-10-CM | POA: Diagnosis not present

## 2022-09-28 DIAGNOSIS — M8588 Other specified disorders of bone density and structure, other site: Secondary | ICD-10-CM | POA: Diagnosis not present

## 2022-09-28 DIAGNOSIS — Z800 Family history of malignant neoplasm of digestive organs: Secondary | ICD-10-CM

## 2022-09-28 DIAGNOSIS — K449 Diaphragmatic hernia without obstruction or gangrene: Secondary | ICD-10-CM | POA: Diagnosis not present

## 2022-09-28 DIAGNOSIS — S3991XA Unspecified injury of abdomen, initial encounter: Secondary | ICD-10-CM | POA: Diagnosis not present

## 2022-09-28 DIAGNOSIS — M810 Age-related osteoporosis without current pathological fracture: Secondary | ICD-10-CM | POA: Diagnosis present

## 2022-09-28 DIAGNOSIS — Z86718 Personal history of other venous thrombosis and embolism: Secondary | ICD-10-CM

## 2022-09-28 DIAGNOSIS — I6200 Nontraumatic subdural hemorrhage, unspecified: Principal | ICD-10-CM

## 2022-09-28 DIAGNOSIS — I5020 Unspecified systolic (congestive) heart failure: Secondary | ICD-10-CM | POA: Diagnosis not present

## 2022-09-28 DIAGNOSIS — Z66 Do not resuscitate: Secondary | ICD-10-CM | POA: Diagnosis not present

## 2022-09-28 DIAGNOSIS — Z86010 Personal history of colonic polyps: Secondary | ICD-10-CM

## 2022-09-28 DIAGNOSIS — R2681 Unsteadiness on feet: Secondary | ICD-10-CM | POA: Diagnosis not present

## 2022-09-28 DIAGNOSIS — S065XAA Traumatic subdural hemorrhage with loss of consciousness status unknown, initial encounter: Secondary | ICD-10-CM | POA: Diagnosis not present

## 2022-09-28 DIAGNOSIS — S065X0A Traumatic subdural hemorrhage without loss of consciousness, initial encounter: Secondary | ICD-10-CM | POA: Diagnosis not present

## 2022-09-28 DIAGNOSIS — I25110 Atherosclerotic heart disease of native coronary artery with unstable angina pectoris: Secondary | ICD-10-CM | POA: Diagnosis not present

## 2022-09-28 DIAGNOSIS — S22060A Wedge compression fracture of T7-T8 vertebra, initial encounter for closed fracture: Secondary | ICD-10-CM | POA: Diagnosis not present

## 2022-09-28 DIAGNOSIS — F03C11 Unspecified dementia, severe, with agitation: Secondary | ICD-10-CM | POA: Diagnosis not present

## 2022-09-28 DIAGNOSIS — S22030D Wedge compression fracture of third thoracic vertebra, subsequent encounter for fracture with routine healing: Secondary | ICD-10-CM | POA: Diagnosis not present

## 2022-09-28 LAB — COMPREHENSIVE METABOLIC PANEL
ALT: 19 U/L (ref 0–44)
AST: 24 U/L (ref 15–41)
Albumin: 3.4 g/dL — ABNORMAL LOW (ref 3.5–5.0)
Alkaline Phosphatase: 78 U/L (ref 38–126)
Anion gap: 11 (ref 5–15)
BUN: 9 mg/dL (ref 8–23)
CO2: 27 mmol/L (ref 22–32)
Calcium: 8.6 mg/dL — ABNORMAL LOW (ref 8.9–10.3)
Chloride: 94 mmol/L — ABNORMAL LOW (ref 98–111)
Creatinine, Ser: 0.69 mg/dL (ref 0.44–1.00)
GFR, Estimated: >60 mL/min (ref >60)
Glucose, Bld: 128 mg/dL — ABNORMAL HIGH (ref 70–99)
Potassium: 3 mmol/L — ABNORMAL LOW (ref 3.5–5.1)
Sodium: 132 mmol/L — ABNORMAL LOW (ref 135–145)
Total Bilirubin: 1.2 mg/dL (ref 0.3–1.2)
Total Protein: 6.6 g/dL (ref 6.5–8.1)

## 2022-09-28 LAB — I-STAT CHEM 8, ED
BUN: 12 mg/dL (ref 8–23)
Calcium, Ion: 1.06 mmol/L — ABNORMAL LOW (ref 1.15–1.40)
Chloride: 94 mmol/L — ABNORMAL LOW (ref 98–111)
Creatinine, Ser: 0.6 mg/dL (ref 0.44–1.00)
Glucose, Bld: 124 mg/dL — ABNORMAL HIGH (ref 70–99)
HCT: 45 % (ref 36.0–46.0)
Hemoglobin: 15.3 g/dL — ABNORMAL HIGH (ref 12.0–15.0)
Potassium: 3.2 mmol/L — ABNORMAL LOW (ref 3.5–5.1)
Sodium: 134 mmol/L — ABNORMAL LOW (ref 135–145)
TCO2: 32 mmol/L (ref 22–32)

## 2022-09-28 LAB — LACTIC ACID, PLASMA: Lactic Acid, Venous: 1 mmol/L (ref 0.5–1.9)

## 2022-09-28 LAB — SAMPLE TO BLOOD BANK

## 2022-09-28 LAB — CBC
HCT: 42 % (ref 36.0–46.0)
Hemoglobin: 12.7 g/dL (ref 12.0–15.0)
MCH: 25 pg — ABNORMAL LOW (ref 26.0–34.0)
MCHC: 30.2 g/dL (ref 30.0–36.0)
MCV: 82.7 fL (ref 80.0–100.0)
Platelets: 428 10*3/uL — ABNORMAL HIGH (ref 150–400)
RBC: 5.08 MIL/uL (ref 3.87–5.11)
RDW: 16.8 % — ABNORMAL HIGH (ref 11.5–15.5)
WBC: 13.6 10*3/uL — ABNORMAL HIGH (ref 4.0–10.5)
nRBC: 0 % (ref 0.0–0.2)

## 2022-09-28 LAB — PROTIME-INR
INR: 1.4 — ABNORMAL HIGH (ref 0.8–1.2)
Prothrombin Time: 17 seconds — ABNORMAL HIGH (ref 11.4–15.2)

## 2022-09-28 LAB — ETHANOL: Alcohol, Ethyl (B): <10 mg/dL (ref <10)

## 2022-09-28 MED ORDER — FENTANYL CITRATE PF 50 MCG/ML IJ SOSY
25.0000 ug | PREFILLED_SYRINGE | Freq: Once | INTRAMUSCULAR | Status: AC
  Administered 2022-09-28: 25 ug via INTRAVENOUS
  Filled 2022-09-28: qty 1

## 2022-09-28 NOTE — ED Provider Notes (Incomplete)
[place] EMERGENCY DEPARTMENT AT [facility] Provider Note   CSN: [id number] Arrival date & time: 09/28/22  2225     History {Add pertinent medical, surgical, social history, OB history to HPI:1} Chief Complaint  Patient presents with  . Fall    [name] is a 89 y.o. female.  89 year old female brought in by EMS from nursing facility for fall. Patient was identified as a fall on blood thinners (Plavix and Eliquis per chart review, no med list provided by facility). Patient states she fell backwards for reason not known to her, hit the back of her head on the ground, injured her left shoulder and her back. States she has had multiple fractures in her spine in the past.  Per EMS report- from [place] [facility] in [place], was trying to get from her recliner to wheelchair and fell on her back with complaint or right shoulder pain. EMS noted O2 sat of 68% on RA, was placed on 4L [place] and c- collar.   Call to [facility] [place]: Last had Eliquis 2.5mg  at 8pm 09/28/22; Plavix 75mg  at 9am 09/28/22       Home Medications Prior to Admission medications   Medication Sig Start Date End Date Taking? Authorizing Provider  acetaminophen (TYLENOL) 325 MG tablet Take 650 mg by mouth every 6 (six) hours as needed for mild pain.    [provider]  alendronate (FOSAMAX) 70 MG tablet Take 1 tablet (70 mg total) by mouth once a week. Take with a full glass of water on an empty stomach. 12/19/21   Wanda Plump, MD  amiodarone (PACERONE) 200 MG tablet Take 200 mg by mouth daily. 06/18/22   [provider]  amLODipine (NORVASC) 5 MG tablet Take 1 tablet (5 mg total) by mouth daily. 12/19/21   Wanda Plump, MD  apixaban (ELIQUIS) 2.5 MG TABS tablet Take 1 tablet (2.5 mg total) by mouth 2 (two) times daily. Patient taking differently: Take 2.5 mg by mouth daily. 02/28/22   Arty Baumgartner, NP  calcium carbonate (TUMS - DOSED IN MG ELEMENTAL CALCIUM) 500 MG chewable tablet Chew 1 tablet (200 mg of  elemental calcium total) by mouth 3 (three) times daily as needed for indigestion or heartburn. 07/28/22   Dorcas Carrow, MD  clopidogrel (PLAVIX) 75 MG tablet Take 1 tablet (75 mg total) by mouth daily. 03/01/22   Arty Baumgartner, NP  docusate sodium (COLACE) 100 MG capsule Take 1 capsule (100 mg total) by mouth 2 (two) times daily. 07/27/22   Dorcas Carrow, MD  furosemide (LASIX) 20 MG tablet Take 1 tablet (20 mg total) by mouth daily as needed for fluid or edema (for weigh gain of 3 pounds or more over one day). Patient taking differently: Take 40 mg by mouth daily. 02/28/22   Arty Baumgartner, NP  HYDROcodone-acetaminophen (NORCO/VICODIN) 5-325 MG tablet Take 1 tablet by mouth every 6 (six) hours as needed for moderate pain or severe pain. 07/27/22 07/27/23  Dorcas Carrow, MD  metoprolol tartrate (LOPRESSOR) 50 MG tablet Take 1 tablet (50 mg total) by mouth 2 (two) times daily. 12/19/21   Wanda Plump, MD  nitroGLYCERIN (NITROSTAT) 0.4 MG SL tablet Place 1 tablet (0.4 mg total) under the tongue every 5 (five) minutes x 3 doses as needed for chest pain. 02/28/22   Arty Baumgartner, NP  pantoprazole (PROTONIX) 40 MG tablet Take 1 tablet (40 mg total) by mouth at bedtime. Patient taking differently: Take 40 mg  by mouth daily. 02/28/22   Arty Baumgartner, NP  polyethylene glycol (MIRALAX / GLYCOLAX) 17 g packet Take 17 g by mouth daily. 07/27/22   Dorcas Carrow, MD  rosuvastatin (CRESTOR) 20 MG tablet Take 1 tablet (20 mg total) by mouth daily. Patient taking differently: Take 20 mg by mouth at bedtime. 03/01/22   Arty Baumgartner, NP  sertraline (ZOLOFT) 25 MG tablet Take 12.5 mg by mouth at bedtime. 06/18/22   [provider]      Allergies    Penicillins, Valsartan, Atorvastatin, Carvedilol, Ezetimibe, Fluvastatin sodium, Magnesium hydroxide, Meloxicam, Pneumovax [pneumococcal polysaccharide vaccine], Quinapril hcl, Simvastatin, Topamax [topiramate], and Vit d-vit e-safflower oil     Review of Systems   Review of Systems Level 5 caveat for poor historian  Physical Exam Updated Vital Signs BP (!) 142/80 (BP Location: Left Arm)   Pulse 60   Temp 97.7 F (36.5 C) (Oral)   Resp 20   SpO2 95%  Physical Exam Vitals and nursing note reviewed.  Constitutional:      General: She is not in acute distress.    Appearance: She is well-developed. She is not diaphoretic.     Interventions: Cervical collar in place.  HENT:     Head: Normocephalic and atraumatic.  Eyes:     Pupils: Pupils are equal, round, and reactive to light.  Cardiovascular:     Rate and Rhythm: Normal rate and regular rhythm.     Pulses: Normal pulses.     Heart sounds: Normal heart sounds.  Pulmonary:     Effort: Pulmonary effort is normal.     Breath sounds: Wheezing present.  Chest:     Chest wall: Tenderness present.  Abdominal:     Palpations: Abdomen is soft.     Tenderness: There is no abdominal tenderness.  Musculoskeletal:        General: No deformity.     Right lower leg: Edema present.     Left lower leg: Edema present.  Skin:    General: Skin is warm and dry.     Findings: No erythema or rash.  Neurological:     Mental Status: She is alert and oriented to person, place, and time.     Sensory: No sensory deficit.  Psychiatric:        Behavior: Behavior normal.     ED Results / Procedures / Treatments   Labs (all labs ordered are listed, but only abnormal results are displayed) Labs Reviewed  COMPREHENSIVE METABOLIC PANEL  CBC  ETHANOL  URINALYSIS, ROUTINE W REFLEX MICROSCOPIC  LACTIC ACID, PLASMA  PROTIME-INR  I-STAT CHEM 8, ED  SAMPLE TO BLOOD BANK    EKG None  Radiology No results found.  Procedures Procedures  {Document cardiac monitor, telemetry assessment procedure when appropriate:1}  Medications Ordered in ED Medications - No data to display  ED Course/ Medical Decision Making/ A&P Clinical Course as of 09/28/22 2311  Fri Sep 28, 2022  2309 I  evaluated [name] at bedside. She has tenderness palpation over right anterior chest and right posterior chest. Agree with activation of level trauma protocol.  Expect rib fractures given new oxygen need.  Pending results at this time.  [CC]    Clinical Course User Index [CC] Glyn Ade, MD   {   Click here for ABCD2, HEART and other calculatorsREFRESH Note before signing :1}  Medical Decision Making Amount and/or Complexity of Data Reviewed Labs: ordered. Radiology: ordered.   ***  {Document critical care time when appropriate:1} {Document review of labs and clinical decision tools ie heart score, Chads2Vasc2 etc:1}  {Document your independent review of radiology images, and any outside records:1} {Document your discussion with family members, caretakers, and with consultants:1} {Document social determinants of health affecting pt's care:1} {Document your decision making why or why not admission, treatments were needed:1} Final Clinical Impression(s) / ED Diagnoses Final diagnoses:  None    Rx / DC Orders ED Discharge Orders     None

## 2022-09-28 NOTE — Progress Notes (Signed)
Orthopedic Tech Progress Note Patient Details:  [name] 02-14-1930 [id number]  Patient ID: [name], female   DOB: October 29, 1929, 89 y.o.   MRN: [id number] Level 2 Trauma, not needed. Sherilyn Banker 09/28/2022, 10:58 PM

## 2022-09-28 NOTE — ED Triage Notes (Signed)
Pt BIB [facility] from [facility] with c/o unwitness fall. Pt reports she was trying to get from recliner to wheelchair and fell on her back. Pt is c/o R shoulder pain. EMS reports that on arrival pt had O2 saturation of 68% on room air. Pt was put on 4L O2 via [place] by EMS with improvement. Pt not on O2 at baseline. C-collar in place.

## 2022-09-28 NOTE — ED Provider Notes (Signed)
[place] EMERGENCY DEPARTMENT AT [facility] Provider Note   CSN: [id number] Arrival date & time: 09/28/22  2225     History  Chief Complaint  Patient presents with   Marletta Lor    [name] is a 89 y.o. female.  89 year old female brought in by EMS from nursing facility for fall. Patient was identified as a fall on blood thinners (Plavix and Eliquis per chart review, no med list provided by facility). Patient states she fell backwards for reason not known to her, hit the back of her head on the ground, injured her left shoulder and her back. States she has had multiple fractures in her spine in the past.  Per EMS report- from [place] in [place], was trying to get from her recliner to wheelchair and fell on her back with complaint or right shoulder pain. EMS noted O2 sat of 68% on RA, was placed on 4L [place] and c- collar.   Call to [facility] [place] (Deloris): Last had Eliquis 2.5mg  at 8pm 09/28/22; Plavix 75mg  at 9am 09/28/22       Home Medications Prior to Admission medications   Medication Sig Start Date End Date Taking? Authorizing Provider  acetaminophen (TYLENOL) 325 MG tablet Take 650 mg by mouth every 6 (six) hours as needed for mild pain.    [provider]  alendronate (FOSAMAX) 70 MG tablet Take 1 tablet (70 mg total) by mouth once a week. Take with a full glass of water on an empty stomach. 12/19/21   Wanda Plump, MD  amiodarone (PACERONE) 200 MG tablet Take 200 mg by mouth daily. 06/18/22   [provider]  amLODipine (NORVASC) 5 MG tablet Take 1 tablet (5 mg total) by mouth daily. 12/19/21   Wanda Plump, MD  apixaban (ELIQUIS) 2.5 MG TABS tablet Take 1 tablet (2.5 mg total) by mouth 2 (two) times daily. Patient taking differently: Take 2.5 mg by mouth daily. 02/28/22   Arty Baumgartner, NP  calcium carbonate (TUMS - DOSED IN MG ELEMENTAL CALCIUM) 500 MG chewable tablet Chew 1 tablet (200 mg of elemental calcium total) by mouth 3 (three) times daily as  needed for indigestion or heartburn. 07/28/22   Dorcas Carrow, MD  clopidogrel (PLAVIX) 75 MG tablet Take 1 tablet (75 mg total) by mouth daily. 03/01/22   Arty Baumgartner, NP  docusate sodium (COLACE) 100 MG capsule Take 1 capsule (100 mg total) by mouth 2 (two) times daily. 07/27/22   Dorcas Carrow, MD  furosemide (LASIX) 20 MG tablet Take 1 tablet (20 mg total) by mouth daily as needed for fluid or edema (for weigh gain of 3 pounds or more over one day). Patient taking differently: Take 40 mg by mouth daily. 02/28/22   Arty Baumgartner, NP  HYDROcodone-acetaminophen (NORCO/VICODIN) 5-325 MG tablet Take 1 tablet by mouth every 6 (six) hours as needed for moderate pain or severe pain. 07/27/22 07/27/23  Dorcas Carrow, MD  metoprolol tartrate (LOPRESSOR) 50 MG tablet Take 1 tablet (50 mg total) by mouth 2 (two) times daily. 12/19/21   Wanda Plump, MD  nitroGLYCERIN (NITROSTAT) 0.4 MG SL tablet Place 1 tablet (0.4 mg total) under the tongue every 5 (five) minutes x 3 doses as needed for chest pain. 02/28/22   Arty Baumgartner, NP  pantoprazole (PROTONIX) 40 MG tablet Take 1 tablet (40 mg total) by mouth at bedtime. Patient taking differently: Take 40 mg by mouth daily. 02/28/22   Laverda Page  B, NP  polyethylene glycol (MIRALAX / GLYCOLAX) 17 g packet Take 17 g by mouth daily. 07/27/22   Dorcas Carrow, MD  rosuvastatin (CRESTOR) 20 MG tablet Take 1 tablet (20 mg total) by mouth daily. Patient taking differently: Take 20 mg by mouth at bedtime. 03/01/22   Arty Baumgartner, NP  sertraline (ZOLOFT) 25 MG tablet Take 12.5 mg by mouth at bedtime. 06/18/22   [provider]      Allergies    Penicillins, Valsartan, Atorvastatin, Carvedilol, Ezetimibe, Fluvastatin sodium, Magnesium hydroxide, Meloxicam, Pneumovax [pneumococcal polysaccharide vaccine], Quinapril hcl, Simvastatin, Topamax [topiramate], and Vit d-vit e-safflower oil    Review of Systems   Review of Systems Level 5 caveat  for poor historian  Physical Exam Updated Vital Signs BP 128/60   Pulse 60   Temp 97.9 F (36.6 C) (Oral)   Resp 18   SpO2 94%  Physical Exam Vitals and nursing note reviewed. Exam conducted with a chaperone present.  Constitutional:      General: She is not in acute distress.    Appearance: She is well-developed. She is not diaphoretic.     Interventions: Cervical collar in place.  HENT:     Head: Normocephalic and atraumatic.  Eyes:     Pupils: Pupils are equal, round, and reactive to light.  Cardiovascular:     Rate and Rhythm: Normal rate and regular rhythm.     Pulses: Normal pulses.     Heart sounds: Normal heart sounds.  Pulmonary:     Effort: Pulmonary effort is normal.     Breath sounds: Wheezing present.  Chest:     Chest wall: Tenderness present.  Abdominal:     Palpations: Abdomen is soft.     Tenderness: There is no abdominal tenderness.  Genitourinary:    Rectum: Guaiac result negative.     Comments: Black stool in pamper is hemoccult negative Musculoskeletal:        General: No deformity.     Right lower leg: Edema present.     Left lower leg: Edema present.  Skin:    General: Skin is warm and dry.     Findings: No erythema or rash.  Neurological:     Mental Status: She is alert and oriented to person, place, and time.     Sensory: No sensory deficit.  Psychiatric:        Behavior: Behavior normal.     ED Results / Procedures / Treatments   Labs (all labs ordered are listed, but only abnormal results are displayed) Labs Reviewed  COMPREHENSIVE METABOLIC PANEL - Abnormal; Notable for the following components:      Result Value   Sodium 132 (*)    Potassium 3.0 (*)    Chloride 94 (*)    Glucose, Bld 128 (*)    Calcium 8.6 (*)    Albumin 3.4 (*)    All other components within normal limits  CBC - Abnormal; Notable for the following components:   WBC 13.6 (*)    MCH 25.0 (*)    RDW 16.8 (*)    Platelets 428 (*)    All other components  within normal limits  PROTIME-INR - Abnormal; Notable for the following components:   Prothrombin Time 17.0 (*)    INR 1.4 (*)    All other components within normal limits  BRAIN NATRIURETIC PEPTIDE - Abnormal; Notable for the following components:   B Natriuretic Peptide 222.0 (*)    All other components within normal  limits  BASIC METABOLIC PANEL - Abnormal; Notable for the following components:   Potassium 2.9 (*)    Chloride 96 (*)    Glucose, Bld 160 (*)    Calcium 8.5 (*)    All other components within normal limits  CBC - Abnormal; Notable for the following components:   WBC 14.3 (*)    Hemoglobin 11.9 (*)    MCH 24.8 (*)    MCHC 29.7 (*)    RDW 16.7 (*)    All other components within normal limits  I-STAT CHEM 8, ED - Abnormal; Notable for the following components:   Sodium 134 (*)    Potassium 3.2 (*)    Chloride 94 (*)    Glucose, Bld 124 (*)    Calcium, Ion 1.06 (*)    Hemoglobin 15.3 (*)    All other components within normal limits  I-STAT ARTERIAL BLOOD GAS, ED - Abnormal; Notable for the following components:   pCO2 arterial 49.5 (*)    pO2, Arterial 54 (*)    Bicarbonate 29.9 (*)    Acid-Base Excess 4.0 (*)    Sodium 134 (*)    Potassium 2.8 (*)    All other components within normal limits  ETHANOL  LACTIC ACID, PLASMA  MAGNESIUM  URINALYSIS, ROUTINE W REFLEX MICROSCOPIC  POC OCCULT BLOOD, ED  SAMPLE TO BLOOD BANK    EKG None  Radiology CT HEAD WO CONTRAST  Addendum Date: 09/29/2022   ADDENDUM REPORT: 09/29/2022 01:23 ADDENDUM: In retrospect the T7 compression fracture described above, does appear to have been present on thoracic spine plain films from 02/19/2022. There is increased sclerosis of the vertebral body compatible with an interval healing response, but the degree of compression and retropulsion seems unchanged. Electronically Signed   By: Almira Bar M.D.   On: 09/29/2022 01:23   Result Date: 09/29/2022 CLINICAL DATA:  Fall trauma with  head trauma, additional blunt poly frequent falls. Trauma. EXAM: CT HEAD WITHOUT CONTRAST CT CERVICAL SPINE WITHOUT CONTRAST CT CHEST, ABDOMEN AND PELVIS WITHOUT CONTRAST TECHNIQUE: Contiguous axial images were obtained from the base of the skull through the vertex without intravenous contrast. Multidetector CT imaging of the cervical spine was performed without intravenous contrast. Multiplanar CT image reconstructions were also generated. Multidetector CT imaging of the chest, abdomen and pelvis was performed following the standard protocol without IV contrast. RADIATION DOSE REDUCTION: This exam was performed according to the departmental dose-optimization program which includes automated exposure control, adjustment of the mA and/or kV according to patient size and/or use of iterative reconstruction technique. COMPARISON:  Head and cervical spine CT both obtained recently 09/24/2022, CTA chest, abdomen and pelvis 10/01/2021, abdomen and pelvis CT 10/03/2020. FINDINGS: CT HEAD FINDINGS Brain: Newly noted is a small acute 3 mm thick right parafalcine subdural bleed in the high frontal area. There is no further evidence of intracranial hemorrhage. There is cerebral atrophy with mild atrophic ventriculomegaly, chronic inferior right frontal infarct, and extensive and fairly confluent hypoattenuating small-vessel disease of the cerebral white matter. There is a chronic right thalamic lacunar infarct and a small chronic infarct anteriorly in the left cerebellar hemisphere. No new cortical based infarct is seen, no mass effect or midline shift. Basal cisterns are clear. Vascular: There is calcification distal right vertebral artery and both siphons. No hyperdense central vessel is seen. Skull: There is osteopenia without evidence for depressed fractures. No scalp hematoma is seen. Sinuses/Orbits: Negative orbits. Clear paranasal sinuses with mild septal deviation to the right. Fluid  again noted the bilateral mastoid  tips, left-greater-than-right, with otherwise clear mastoids. Other: None. CT CERVICAL FINDINGS Alignment: Unchanged. No traumatic listhesis is suspected. A slight degenerative listhesis is again noted at C4-5 and C7-T1. Bone-on-bone anterior atlantodental joint space loss and osteophytosis is again shown. There is no C1-2 offset. Skull base and vertebrae: Generalized osteopenia. There is a chronic mild upper plate anterior wedge compression fracture of the T1 vertebral body. This appears unchanged. At C7, new from 09/24/2022 there is a slight loss of vertebral height of about 15% and mild anterior bulge newly seen in the contour of the anterior vertebral body, findings consistent with a new mild compression fracture. At T3, new since 10/01/2021 there is a compression fracture with up to 30-40% generalized vertebral height loss and mild fragmentation, slight outward spreading of fragments and retropulsion, without cord compromise. Kyphoplasty cement and partial vertebral height loss are unchanged at T4. Soft tissues and spinal canal: There is edema and swelling around the new T3 compression fracture but no notable swelling at C7. There is no definitive spinal canal hematoma. Soft tissue resolution in the thoracic spine is less than in the cervical spine due to patient's overlying arms. There is moderate calcification in both proximal cervical ICAs. No laryngeal mass or thyroid nodules. Disc levels: Preservation of disc heights except for mild disc space loss at C5-6. There are mild facet joint and uncinate spurring changes at most levels, greater on the left, moderate left foraminal stenosis again noted C3-4, C4-5 and C6-7. No other significant foraminal stenosis. Other: None. CT CHEST FINDINGS Cardiovascular: Mild cardiomegaly. There are three-vessel coronary artery calcifications, aortic tortuosity, and moderate calcific plaques of the aorta and great vessels without aneurysm. Pulmonary arteries and veins appear  normal in caliber. Mediastinum/Nodes: Calcified mediastinal and right hilar nodes. No adenopathy is seen without contrast. Moderate hiatal hernia is unchanged. Lungs/Pleura: Small layering pleural effusions new from prior study. No pneumothorax. The main bronchi are clear. There are scarring changes in the apices and bases increased atelectatic change in the left lower lobe versus consolidation. There is diffuse bronchial thickening. No nodules or further new findings. Musculoskeletal: Generalized osteopenia. There is a chronic fracture of the proximal right humerus. There are multiple bilateral chronic healed rib fracture deformities without acute displaced rib fracture. Rudimentary C7 cervical ribs.  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 Kyphoplasty cement is again noted in the T4 T8, T9, T10 and T12 vertebrae. In addition to the new compression fracture described above at T3 there is also a new moderate to severe compression fracture of the T7 vertebral body, with 75-80% loss of the vertebral height in general, with about 4 mm of posteroinferior cortical retropulsion. This mildly narrows the thecal sac eccentric to the left to 6.3 mm AP. Unchanged treated moderate wedging T8, T9, T10 and T12. No definitive evidence of a canal hematoma but there is increased soft tissue fullness alongside T3 and T7. CT ABDOMEN AND PELVIS FINDINGS Hepatobiliary: Increasingly dense liver measuring 20.6 cm length. Clinical correlation advised for amiodarone or heavy metal toxicity versus iron deposition. There are scattered calcified granulomas without visible mass. Gallbladder is absent with no biliary dilatation. Pancreas: No abnormality. Spleen: Costophrenic granulomas. Otherwise no further abnormality. No adjacent hematoma. Adrenals/Urinary Tract: No significant findings. No calculus. No bladder thickening or contour deforming mass of the kidneys. Stomach/Bowel: No dilatation or wall thickening. Moderate fecal stasis.  Diverticulosis without diverticulitis. Vascular/Lymphatic: Heavy aortoiliac calcific plaques with visceral branch vessel atherosclerosis. No adenopathy is seen. Reproductive:  Small calcified uterine fibroid. No other significant findings. Other: Mild generalized body wall edema similar. Trace presacral ascites is seen and mild mesenteric congestive features, also similar. There is no free hemorrhage or free air. Musculoskeletal: Since 10/01/2021, interval right hip nailing. There are healed pubic rami fractures. Osteopenia. Four lumbar type vertebrae with chronic wedging and kyphoplasty L1. Mild biconcave appearance of L2 and mild upper plate concavity at L3. Normal height of L5. All unchanged. No regional acute skeletal fracture is seen. IMPRESSION: 1. 3 mm acute small right parafalcine subdural bleed in the high frontal area. No mass effect or midline shift. 2. Osteopenia and degenerative change of the cervical spine without traumatic listhesis. 3. New T3 compression fracture with 30-40% loss of the vertebral height and mild fragmentation, with slight outward spreading of fragments and mild retropulsion. No cord compromise but there is mild narrowing of the thecal sac. 4. New C7 mild compression fracture with loss of vertebral height of about 15% and mild anterior bulge of the anterior vertebral body. 5. No definitive canal hematoma but there is increased soft tissue fullness alongside T3 and T7. MRI follow-up recommended. Multilevel old kyphoplasty. 6. Small pleural effusions with increased atelectatic change versus consolidation in the left lower lobe. Diffuse bronchitis. 7. Cardiomegaly with calcific CAD, aortic and branch vessel atherosclerosis. 8. Increasingly dense liver. Clinical correlation advised for amiodarone or heavy metal toxicity versus iron deposition. 9. No acute trauma related findings in the abdomen or pelvis. 10. Chronically noted mesenteric congestion and mild body wall edema. Trace ascites.  11. Constipation and diverticulosis. 12. Interval right hip nailing since last year. 13. Critical Value/emergent results were called by telephone at the time of interpretation on 09/28/2022 at 11:49 pm to provider Dr. Madilyn Hook, who verbally acknowledged these results. Aortic Atherosclerosis (ICD10-I70.0). Electronically Signed: By: Almira Bar M.D. On: 09/29/2022 00:36   CT CERVICAL SPINE WO CONTRAST  Addendum Date: 09/29/2022   ADDENDUM REPORT: 09/29/2022 01:23 ADDENDUM: In retrospect the T7 compression fracture described above, does appear to have been present on thoracic spine plain films from 02/19/2022. There is increased sclerosis of the vertebral body compatible with an interval healing response, but the degree of compression and retropulsion seems unchanged. Electronically Signed   By: Almira Bar M.D.   On: 09/29/2022 01:23   Result Date: 09/29/2022 CLINICAL DATA:  Fall trauma with head trauma, additional blunt poly frequent falls. Trauma. EXAM: CT HEAD WITHOUT CONTRAST CT CERVICAL SPINE WITHOUT CONTRAST CT CHEST, ABDOMEN AND PELVIS WITHOUT CONTRAST TECHNIQUE: Contiguous axial images were obtained from the base of the skull through the vertex without intravenous contrast. Multidetector CT imaging of the cervical spine was performed without intravenous contrast. Multiplanar CT image reconstructions were also generated. Multidetector CT imaging of the chest, abdomen and pelvis was performed following the standard protocol without IV contrast. RADIATION DOSE REDUCTION: This exam was performed according to the departmental dose-optimization program which includes automated exposure control, adjustment of the mA and/or kV according to patient size and/or use of iterative reconstruction technique. COMPARISON:  Head and cervical spine CT both obtained recently 09/24/2022, CTA chest, abdomen and pelvis 10/01/2021, abdomen and pelvis CT 10/03/2020. FINDINGS: CT HEAD FINDINGS Brain: Newly noted is a small  acute 3 mm thick right parafalcine subdural bleed in the high frontal area. There is no further evidence of intracranial hemorrhage. There is cerebral atrophy with mild atrophic ventriculomegaly, chronic inferior right frontal infarct, and extensive and fairly confluent hypoattenuating small-vessel disease of the cerebral white matter. There is  a chronic right thalamic lacunar infarct and a small chronic infarct anteriorly in the left cerebellar hemisphere. No new cortical based infarct is seen, no mass effect or midline shift. Basal cisterns are clear. Vascular: There is calcification distal right vertebral artery and both siphons. No hyperdense central vessel is seen. Skull: There is osteopenia without evidence for depressed fractures. No scalp hematoma is seen. Sinuses/Orbits: Negative orbits. Clear paranasal sinuses with mild septal deviation to the right. Fluid again noted the bilateral mastoid tips, left-greater-than-right, with otherwise clear mastoids. Other: None. CT CERVICAL FINDINGS Alignment: Unchanged. No traumatic listhesis is suspected. A slight degenerative listhesis is again noted at C4-5 and C7-T1. Bone-on-bone anterior atlantodental joint space loss and osteophytosis is again shown. There is no C1-2 offset. Skull base and vertebrae: Generalized osteopenia. There is a chronic mild upper plate anterior wedge compression fracture of the T1 vertebral body. This appears unchanged. At C7, new from 09/24/2022 there is a slight loss of vertebral height of about 15% and mild anterior bulge newly seen in the contour of the anterior vertebral body, findings consistent with a new mild compression fracture. At T3, new since 10/01/2021 there is a compression fracture with up to 30-40% generalized vertebral height loss and mild fragmentation, slight outward spreading of fragments and retropulsion, without cord compromise. Kyphoplasty cement and partial vertebral height loss are unchanged at T4. Soft tissues and  spinal canal: There is edema and swelling around the new T3 compression fracture but no notable swelling at C7. There is no definitive spinal canal hematoma. Soft tissue resolution in the thoracic spine is less than in the cervical spine due to patient's overlying arms. There is moderate calcification in both proximal cervical ICAs. No laryngeal mass or thyroid nodules. Disc levels: Preservation of disc heights except for mild disc space loss at C5-6. There are mild facet joint and uncinate spurring changes at most levels, greater on the left, moderate left foraminal stenosis again noted C3-4, C4-5 and C6-7. No other significant foraminal stenosis. Other: None. CT CHEST FINDINGS Cardiovascular: Mild cardiomegaly. There are three-vessel coronary artery calcifications, aortic tortuosity, and moderate calcific plaques of the aorta and great vessels without aneurysm. Pulmonary arteries and veins appear normal in caliber. Mediastinum/Nodes: Calcified mediastinal and right hilar nodes. No adenopathy is seen without contrast. Moderate hiatal hernia is unchanged. Lungs/Pleura: Small layering pleural effusions new from prior study. No pneumothorax. The main bronchi are clear. There are scarring changes in the apices and bases increased atelectatic change in the left lower lobe versus consolidation. There is diffuse bronchial thickening. No nodules or further new findings. Musculoskeletal: Generalized osteopenia. There is a chronic fracture of the proximal right humerus. There are multiple bilateral chronic healed rib fracture deformities without acute displaced rib fracture. Rudimentary C7 cervical ribs.  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 Kyphoplasty cement is again noted in the T4 T8, T9, T10 and T12 vertebrae. In addition to the new compression fracture described above at T3 there is also a new moderate to severe compression fracture of the T7 vertebral body, with 75-80% loss of the vertebral height in general, with  about 4 mm of posteroinferior cortical retropulsion. This mildly narrows the thecal sac eccentric to the left to 6.3 mm AP. Unchanged treated moderate wedging T8, T9, T10 and T12. No definitive evidence of a canal hematoma but there is increased soft tissue fullness alongside T3 and T7. CT ABDOMEN AND PELVIS FINDINGS Hepatobiliary: Increasingly dense liver measuring 20.6 cm length. Clinical correlation advised for amiodarone or  heavy metal toxicity versus iron deposition. There are scattered calcified granulomas without visible mass. Gallbladder is absent with no biliary dilatation. Pancreas: No abnormality. Spleen: Costophrenic granulomas. Otherwise no further abnormality. No adjacent hematoma. Adrenals/Urinary Tract: No significant findings. No calculus. No bladder thickening or contour deforming mass of the kidneys. Stomach/Bowel: No dilatation or wall thickening. Moderate fecal stasis. Diverticulosis without diverticulitis. Vascular/Lymphatic: Heavy aortoiliac calcific plaques with visceral branch vessel atherosclerosis. No adenopathy is seen. Reproductive: Small calcified uterine fibroid. No other significant findings. Other: Mild generalized body wall edema similar. Trace presacral ascites is seen and mild mesenteric congestive features, also similar. There is no free hemorrhage or free air. Musculoskeletal: Since 10/01/2021, interval right hip nailing. There are healed pubic rami fractures. Osteopenia. Four lumbar type vertebrae with chronic wedging and kyphoplasty L1. Mild biconcave appearance of L2 and mild upper plate concavity at L3. Normal height of L5. All unchanged. No regional acute skeletal fracture is seen. IMPRESSION: 1. 3 mm acute small right parafalcine subdural bleed in the high frontal area. No mass effect or midline shift. 2. Osteopenia and degenerative change of the cervical spine without traumatic listhesis. 3. New T3 compression fracture with 30-40% loss of the vertebral height and mild  fragmentation, with slight outward spreading of fragments and mild retropulsion. No cord compromise but there is mild narrowing of the thecal sac. 4. New C7 mild compression fracture with loss of vertebral height of about 15% and mild anterior bulge of the anterior vertebral body. 5. No definitive canal hematoma but there is increased soft tissue fullness alongside T3 and T7. MRI follow-up recommended. Multilevel old kyphoplasty. 6. Small pleural effusions with increased atelectatic change versus consolidation in the left lower lobe. Diffuse bronchitis. 7. Cardiomegaly with calcific CAD, aortic and branch vessel atherosclerosis. 8. Increasingly dense liver. Clinical correlation advised for amiodarone or heavy metal toxicity versus iron deposition. 9. No acute trauma related findings in the abdomen or pelvis. 10. Chronically noted mesenteric congestion and mild body wall edema. Trace ascites. 11. Constipation and diverticulosis. 12. Interval right hip nailing since last year. 13. Critical Value/emergent results were called by telephone at the time of interpretation on 09/28/2022 at 11:49 pm to provider Dr. Madilyn Hook, who verbally acknowledged these results. Aortic Atherosclerosis (ICD10-I70.0). Electronically Signed: By: Almira Bar M.D. On: 09/29/2022 00:36   CT CHEST ABDOMEN PELVIS WO CONTRAST  Addendum Date: 09/29/2022   ADDENDUM REPORT: 09/29/2022 01:23 ADDENDUM: In retrospect the T7 compression fracture described above, does appear to have been present on thoracic spine plain films from 02/19/2022. There is increased sclerosis of the vertebral body compatible with an interval healing response, but the degree of compression and retropulsion seems unchanged. Electronically Signed   By: Almira Bar M.D.   On: 09/29/2022 01:23   Result Date: 09/29/2022 CLINICAL DATA:  Fall trauma with head trauma, additional blunt poly frequent falls. Trauma. EXAM: CT HEAD WITHOUT CONTRAST CT CERVICAL SPINE WITHOUT CONTRAST CT  CHEST, ABDOMEN AND PELVIS WITHOUT CONTRAST TECHNIQUE: Contiguous axial images were obtained from the base of the skull through the vertex without intravenous contrast. Multidetector CT imaging of the cervical spine was performed without intravenous contrast. Multiplanar CT image reconstructions were also generated. Multidetector CT imaging of the chest, abdomen and pelvis was performed following the standard protocol without IV contrast. RADIATION DOSE REDUCTION: This exam was performed according to the departmental dose-optimization program which includes automated exposure control, adjustment of the mA and/or kV according to patient size and/or use of iterative reconstruction technique. COMPARISON:  Head and cervical spine CT both obtained recently 09/24/2022, CTA chest, abdomen and pelvis 10/01/2021, abdomen and pelvis CT 10/03/2020. FINDINGS: CT HEAD FINDINGS Brain: Newly noted is a small acute 3 mm thick right parafalcine subdural bleed in the high frontal area. There is no further evidence of intracranial hemorrhage. There is cerebral atrophy with mild atrophic ventriculomegaly, chronic inferior right frontal infarct, and extensive and fairly confluent hypoattenuating small-vessel disease of the cerebral white matter. There is a chronic right thalamic lacunar infarct and a small chronic infarct anteriorly in the left cerebellar hemisphere. No new cortical based infarct is seen, no mass effect or midline shift. Basal cisterns are clear. Vascular: There is calcification distal right vertebral artery and both siphons. No hyperdense central vessel is seen. Skull: There is osteopenia without evidence for depressed fractures. No scalp hematoma is seen. Sinuses/Orbits: Negative orbits. Clear paranasal sinuses with mild septal deviation to the right. Fluid again noted the bilateral mastoid tips, left-greater-than-right, with otherwise clear mastoids. Other: None. CT CERVICAL FINDINGS Alignment: Unchanged. No traumatic  listhesis is suspected. A slight degenerative listhesis is again noted at C4-5 and C7-T1. Bone-on-bone anterior atlantodental joint space loss and osteophytosis is again shown. There is no C1-2 offset. Skull base and vertebrae: Generalized osteopenia. There is a chronic mild upper plate anterior wedge compression fracture of the T1 vertebral body. This appears unchanged. At C7, new from 09/24/2022 there is a slight loss of vertebral height of about 15% and mild anterior bulge newly seen in the contour of the anterior vertebral body, findings consistent with a new mild compression fracture. At T3, new since 10/01/2021 there is a compression fracture with up to 30-40% generalized vertebral height loss and mild fragmentation, slight outward spreading of fragments and retropulsion, without cord compromise. Kyphoplasty cement and partial vertebral height loss are unchanged at T4. Soft tissues and spinal canal: There is edema and swelling around the new T3 compression fracture but no notable swelling at C7. There is no definitive spinal canal hematoma. Soft tissue resolution in the thoracic spine is less than in the cervical spine due to patient's overlying arms. There is moderate calcification in both proximal cervical ICAs. No laryngeal mass or thyroid nodules. Disc levels: Preservation of disc heights except for mild disc space loss at C5-6. There are mild facet joint and uncinate spurring changes at most levels, greater on the left, moderate left foraminal stenosis again noted C3-4, C4-5 and C6-7. No other significant foraminal stenosis. Other: None. CT CHEST FINDINGS Cardiovascular: Mild cardiomegaly. There are three-vessel coronary artery calcifications, aortic tortuosity, and moderate calcific plaques of the aorta and great vessels without aneurysm. Pulmonary arteries and veins appear normal in caliber. Mediastinum/Nodes: Calcified mediastinal and right hilar nodes. No adenopathy is seen without contrast. Moderate  hiatal hernia is unchanged. Lungs/Pleura: Small layering pleural effusions new from prior study. No pneumothorax. The main bronchi are clear. There are scarring changes in the apices and bases increased atelectatic change in the left lower lobe versus consolidation. There is diffuse bronchial thickening. No nodules or further new findings. Musculoskeletal: Generalized osteopenia. There is a chronic fracture of the proximal right humerus. There are multiple bilateral chronic healed rib fracture deformities without acute displaced rib fracture. Rudimentary C7 cervical ribs.  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 Kyphoplasty cement is again noted in the T4 T8, T9, T10 and T12 vertebrae. In addition to the new compression fracture described above at T3 there is also a new moderate to severe compression fracture of the T7  vertebral body, with 75-80% loss of the vertebral height in general, with about 4 mm of posteroinferior cortical retropulsion. This mildly narrows the thecal sac eccentric to the left to 6.3 mm AP. Unchanged treated moderate wedging T8, T9, T10 and T12. No definitive evidence of a canal hematoma but there is increased soft tissue fullness alongside T3 and T7. CT ABDOMEN AND PELVIS FINDINGS Hepatobiliary: Increasingly dense liver measuring 20.6 cm length. Clinical correlation advised for amiodarone or heavy metal toxicity versus iron deposition. There are scattered calcified granulomas without visible mass. Gallbladder is absent with no biliary dilatation. Pancreas: No abnormality. Spleen: Costophrenic granulomas. Otherwise no further abnormality. No adjacent hematoma. Adrenals/Urinary Tract: No significant findings. No calculus. No bladder thickening or contour deforming mass of the kidneys. Stomach/Bowel: No dilatation or wall thickening. Moderate fecal stasis. Diverticulosis without diverticulitis. Vascular/Lymphatic: Heavy aortoiliac calcific plaques with visceral branch vessel atherosclerosis. No  adenopathy is seen. Reproductive: Small calcified uterine fibroid. No other significant findings. Other: Mild generalized body wall edema similar. Trace presacral ascites is seen and mild mesenteric congestive features, also similar. There is no free hemorrhage or free air. Musculoskeletal: Since 10/01/2021, interval right hip nailing. There are healed pubic rami fractures. Osteopenia. Four lumbar type vertebrae with chronic wedging and kyphoplasty L1. Mild biconcave appearance of L2 and mild upper plate concavity at L3. Normal height of L5. All unchanged. No regional acute skeletal fracture is seen. IMPRESSION: 1. 3 mm acute small right parafalcine subdural bleed in the high frontal area. No mass effect or midline shift. 2. Osteopenia and degenerative change of the cervical spine without traumatic listhesis. 3. New T3 compression fracture with 30-40% loss of the vertebral height and mild fragmentation, with slight outward spreading of fragments and mild retropulsion. No cord compromise but there is mild narrowing of the thecal sac. 4. New C7 mild compression fracture with loss of vertebral height of about 15% and mild anterior bulge of the anterior vertebral body. 5. No definitive canal hematoma but there is increased soft tissue fullness alongside T3 and T7. MRI follow-up recommended. Multilevel old kyphoplasty. 6. Small pleural effusions with increased atelectatic change versus consolidation in the left lower lobe. Diffuse bronchitis. 7. Cardiomegaly with calcific CAD, aortic and branch vessel atherosclerosis. 8. Increasingly dense liver. Clinical correlation advised for amiodarone or heavy metal toxicity versus iron deposition. 9. No acute trauma related findings in the abdomen or pelvis. 10. Chronically noted mesenteric congestion and mild body wall edema. Trace ascites. 11. Constipation and diverticulosis. 12. Interval right hip nailing since last year. 13. Critical Value/emergent results were called by  telephone at the time of interpretation on 09/28/2022 at 11:49 pm to provider Dr. Madilyn Hook, who verbally acknowledged these results. Aortic Atherosclerosis (ICD10-I70.0). Electronically Signed: By: Almira Bar M.D. On: 09/29/2022 00:36   CT L-SPINE NO CHARGE  Result Date: 09/29/2022 CLINICAL DATA:  Back trauma.  Level 2 fall on blood thinners. EXAM: CT THORACIC AND LUMBAR SPINE WITHOUT CONTRAST TECHNIQUE: Multidetector CT imaging of the thoracic and lumbar spine was performed without contrast. Multiplanar CT image reconstructions were also generated. RADIATION DOSE REDUCTION: This exam was performed according to the departmental dose-optimization program which includes automated exposure control, adjustment of the mA and/or kV according to patient size and/or use of iterative reconstruction technique. COMPARISON:  Thoracic spine radiographs 02/19/2022 and CT thoracic and lumbar spine 10/01/2021 FINDINGS: CT THORACIC SPINE FINDINGS Alignment: No evidence of traumatic malalignment. Accentuated thoracic kyphosis centered at T7-T8. Vertebrae: Status post vertebroplasty at T4, T8, T9, and T12.  There is new superior endplate compression of T3 when compared with 02/19/2022 with mildly displaced right anterior superior corner fracture (series 3/image 57). Unchanged superior endplate compression of T1 where it is mild, T7 where it is advanced with unchanged retropulsion of the posterior endplate. Extrusion vertebroplasty material through the superior endplate of T12 with indentation of the inferior endplate of T11, unchanged from prior radiograph. Paraspinal and other soft tissues: Unremarkable. See separate report for findings in the chest, abdomen, and pelvis. Disc levels: Multilevel spondylosis, disc space height loss, and degenerative endplate changes. Osteopenia. No severe spinal canal narrowing. Retropulsion of the posterior vertebral body walls at T3, T7, T8, T9, T10, and T12 cause up to mild effacement of the  ventral thecal sac. CT LUMBAR SPINE FINDINGS Segmentation: Transitional anatomy. Absent ribs at T12. Fully sacralized L5. Careful attention to numbering is recommended prior to any intervention. Alignment: No evidence of traumatic malalignment. Vertebrae: L1 vertebroplasty. Superior endplate compression fractures are unchanged in L2 and L3. No evidence of acute fracture. Paraspinal and other soft tissues: Negative. See separate report for findings in the abdomen and pelvis. Disc levels: Multilevel spondylosis, disc space height loss, degenerative endplate changes with vacuum phenomenon. Multilevel moderate-advanced facet arthropathy. Inferior endplate retropulsion at L1 causes mild effacement of the ventral thecal sac. No high-grade spinal canal. IMPRESSION: CT THORACIC SPINE IMPRESSION. 1. Age indeterminate superior endplate compression fracture of T3 with 40% vertebral body height loss and mildly displaced right anterior superior corner fracture. This is new since thoracic spine radiographs 02/19/2022 and may be acute. CT LUMBAR SPINE IMPRESSION. 1. No acute fracture in the lumbar spine. Unchanged superior endplate compression fractures at L2 and L3. 2. Transitional anatomy with complete sacralized L5. Careful attention to numbering is recommended prior to any intervention. Same day CT chest, abdomen, pelvis reported separately. Electronically Signed   By: Minerva Fester M.D.   On: 09/29/2022 00:05   CT T-SPINE NO CHARGE  Result Date: 09/29/2022 CLINICAL DATA:  Back trauma.  Level 2 fall on blood thinners. EXAM: CT THORACIC AND LUMBAR SPINE WITHOUT CONTRAST TECHNIQUE: Multidetector CT imaging of the thoracic and lumbar spine was performed without contrast. Multiplanar CT image reconstructions were also generated. RADIATION DOSE REDUCTION: This exam was performed according to the departmental dose-optimization program which includes automated exposure control, adjustment of the mA and/or kV according to  patient size and/or use of iterative reconstruction technique. COMPARISON:  Thoracic spine radiographs 02/19/2022 and CT thoracic and lumbar spine 10/01/2021 FINDINGS: CT THORACIC SPINE FINDINGS Alignment: No evidence of traumatic malalignment. Accentuated thoracic kyphosis centered at T7-T8. Vertebrae: Status post vertebroplasty at T4, T8, T9, and T12. There is new superior endplate compression of T3 when compared with 02/19/2022 with mildly displaced right anterior superior corner fracture (series 3/image 57). Unchanged superior endplate compression of T1 where it is mild, T7 where it is advanced with unchanged retropulsion of the posterior endplate. Extrusion vertebroplasty material through the superior endplate of T12 with indentation of the inferior endplate of T11, unchanged from prior radiograph. Paraspinal and other soft tissues: Unremarkable. See separate report for findings in the chest, abdomen, and pelvis. Disc levels: Multilevel spondylosis, disc space height loss, and degenerative endplate changes. Osteopenia. No severe spinal canal narrowing. Retropulsion of the posterior vertebral body walls at T3, T7, T8, T9, T10, and T12 cause up to mild effacement of the ventral thecal sac. CT LUMBAR SPINE FINDINGS Segmentation: Transitional anatomy. Absent ribs at T12. Fully sacralized L5. Careful attention to numbering is recommended prior to  any intervention. Alignment: No evidence of traumatic malalignment. Vertebrae: L1 vertebroplasty. Superior endplate compression fractures are unchanged in L2 and L3. No evidence of acute fracture. Paraspinal and other soft tissues: Negative. See separate report for findings in the abdomen and pelvis. Disc levels: Multilevel spondylosis, disc space height loss, degenerative endplate changes with vacuum phenomenon. Multilevel moderate-advanced facet arthropathy. Inferior endplate retropulsion at L1 causes mild effacement of the ventral thecal sac. No high-grade spinal canal.  IMPRESSION: CT THORACIC SPINE IMPRESSION. 1. Age indeterminate superior endplate compression fracture of T3 with 40% vertebral body height loss and mildly displaced right anterior superior corner fracture. This is new since thoracic spine radiographs 02/19/2022 and may be acute. CT LUMBAR SPINE IMPRESSION. 1. No acute fracture in the lumbar spine. Unchanged superior endplate compression fractures at L2 and L3. 2. Transitional anatomy with complete sacralized L5. Careful attention to numbering is recommended prior to any intervention. Same day CT chest, abdomen, pelvis reported separately. Electronically Signed   By: Minerva Fester M.D.   On: 09/29/2022 00:05   DG Pelvis Portable  Result Date: 09/28/2022 CLINICAL DATA:  Trauma fall EXAM: PORTABLE PELVIS 1-2 VIEWS COMPARISON:  05/02/2021, 07/23/2022 FINDINGS: SI joints are non widened. Pubic symphysis appears intact. Old appearing left inferior pubic ramus fracture. Remote left femoral orthopedic hardware. Interval intramedullary rodding of comminuted right intertrochanteric fracture. No definite acute osseous abnormality. IMPRESSION: 1. No acute osseous abnormality. 2. Interval intramedullary rodding of comminuted right intertrochanteric fracture. Electronically Signed   By: Jasmine Pang M.D.   On: 09/28/2022 23:19   DG Chest Port 1 View  Result Date: 09/28/2022 CLINICAL DATA:  Trauma fall EXAM: PORTABLE CHEST 1 VIEW COMPARISON:  07/23/2022 FINDINGS: Mild diffuse interstitial opacity likely chronic disease. Negative for pneumothorax. Healing right proximal humerus fracture with callus. Multiple treated vertebral compression deformities. Multiple remote right rib fractures. Hiatal hernia contributing to retrocardiac opacity. IMPRESSION: 1. Negative for pneumothorax or pleural effusion. 2. Healing right proximal humerus fracture with callus. 3. Hiatal hernia. Electronically Signed   By: Jasmine Pang M.D.   On: 09/28/2022 23:18    Procedures .Critical  Care  Performed by: Jeannie Fend, PA-C Authorized by: Jeannie Fend, PA-C   Critical care provider statement:    Critical care time (minutes):  30   Critical care was time spent personally by me on the following activities:  Development of treatment plan with patient or surrogate, discussions with consultants, evaluation of patient's response to treatment, examination of patient, ordering and review of laboratory studies, ordering and review of radiographic studies, ordering and performing treatments and interventions, pulse oximetry, re-evaluation of patient's condition and review of old charts     Medications Ordered in ED Medications  amiodarone (PACERONE) tablet 200 mg (has no administration in time range)  pantoprazole (PROTONIX) EC tablet 40 mg (has no administration in time range)  rosuvastatin (CRESTOR) tablet 20 mg (has no administration in time range)  sertraline (ZOLOFT) tablet 12.5 mg (has no administration in time range)  ipratropium-albuterol (DUONEB) 0.5-2.5 (3) MG/3ML nebulizer solution 3 mL (has no administration in time range)  potassium chloride 10 mEq in 100 mL IVPB (has no administration in time range)  acetaminophen (TYLENOL) tablet 650 mg (has no administration in time range)  oxyCODONE (Oxy IR/ROXICODONE) immediate release tablet 5 mg (has no administration in time range)  HYDROmorphone (DILAUDID) injection 0.5 mg (has no administration in time range)  polyethylene glycol (MIRALAX / GLYCOLAX) packet 17 g (has no administration in time range)  prochlorperazine (  COMPAZINE) injection 5 mg (has no administration in time range)  fentaNYL (SUBLIMAZE) injection 25 mcg (25 mcg Intravenous Given 09/28/22 2308)  fentaNYL (SUBLIMAZE) injection 25 mcg (25 mcg Intravenous Given 09/28/22 2350)  ondansetron (ZOFRAN) injection 4 mg (4 mg Intravenous Given 09/29/22 0034)  morphine (PF) 4 MG/ML injection 4 mg (4 mg Intravenous Given 09/29/22 0035)  ipratropium-albuterol (DUONEB)  0.5-2.5 (3) MG/3ML nebulizer solution 3 mL (3 mLs Nebulization Given 09/29/22 0145)    ED Course/ Medical Decision Making/ A&P Clinical Course as of 09/29/22 0507  Fri Sep 28, 2022  2309 I evaluated [name] at bedside. She has tenderness palpation over right anterior chest and right posterior chest. Agree with activation of level trauma protocol.  Expect rib fractures given new oxygen need.  Pending results at this time.  [CC]    Clinical Course User Index [CC] Glyn Ade, MD                             Medical Decision Making Amount and/or Complexity of Data Reviewed Labs: ordered. Radiology: ordered.  Risk Prescription drug management. Decision regarding hospitalization.   This patient presents to the ED for concern of injuries from fall, this involves an extensive number of treatment options, and is a complaint that carries with it a high risk of complications and morbidity.  The differential diagnosis includes but not limited to intracranial injury, fracture, pulmonary contusion   Co morbidities that complicate the patient evaluation  Hypertension, STEMI, paroxysmal A-fib, CHF   Additional history obtained:  Additional history obtained from EMS as above as well as call to facility to verify last doses of Eliquis and Plavix  External records from outside source obtained and reviewed including medication list   Lab Tests:  I Ordered, and personally interpreted labs.  The pertinent results include:  CBC with WBC 13.6. INR 1.4. etoh negative. CMP with mild hypokalemia at 3.0.    Imaging Studies ordered:  I ordered imaging studies including CT head/c-spine/t-spine/L-spine, CT C/A/P, portable XR pelvis and chest  I independently visualized and interpreted imaging which showed small subdural bleed, multiple chronic changes in the spine- per radiology- C7, T3, T7 compression fractures  I agree with the radiologist interpretation   Consultations Obtained:  I  requested consultation with the Neurosurgery Alli Cosentino, PA-C,  and discussed lab and imaging findings as well as pertinent plan - they recommend: admit to hospitalist service, repeat non contrast head CT at 6am. Neurosurgery to round on patient later today.    Problem List / ED Course / Critical interventions / Medication management  89 year old female brought in by EMS from skilled nursing facility.  Patient was trying to get from her recliner to her wheelchair when she fell backwards striking her back and back of her head on the ground.  No LOC.  Reports pain in her left shoulder and mid back as well as back of her head.  Patient is on Eliquis and Xarelto.  Last dose of Eliquis was at 8 PM on May 17.  Last dose of Plavix was at 9 AM on May 17.  Patient is found to have a small 3 mm subdural head bleed, discussed with neurosurgery team who recommends holding her Eliquis and Xarelto, does not need reversal of her Eliquis at this time, recommends repeat head CT at 6 AM with neurosurgery team to see patient later in the day.  Regarding her shoulder pain, there is no acute  bony abnormality identified today.  She is found to have a new C7, T3, T7 compression fractures.  Patient is placed in an Aspen collar.  She also has a new oxygen requirement today.  EMS noted her O2 sat was in the 60s on their arrival, improved with 4 L nasal cannula.  Patient has some wheezing on exam, does have some pitting edema to her extremities and CHF history.  Patient does not want to sit up in bed because of her pain.  Plan is to trial nebulizer treatment. I ordered medication including fentanyl, morphine, Zofran for pain Reevaluation of the patient after these medicines showed that the patient improved I have reviewed the patients home medicines and have made adjustments as needed   Social Determinants of Health:  Lives at facility    Test / Admission - Considered:  admit         Final Clinical Impression(s)  / ED Diagnoses Final diagnoses:  Fall, initial encounter  Subdural bleeding (HCC)  Compression fracture of C7 vertebra, initial encounter (HCC)  Compression fracture of T3 vertebra, initial encounter (HCC)  Compression fracture of T7 vertebra, initial encounter [facility])  Hypoxia    Rx / DC Orders ED Discharge Orders     None         Jeannie Fend, PA-C 09/29/22 0507    Glyn Ade, MD 09/30/22 1307

## 2022-09-28 NOTE — ED Notes (Signed)
Trauma Response Nurse Documentation   [name] is a 89 y.o. female arriving to [facility] ED via EMS  On Eliquis (apixaban) daily. Trauma was activated as a Level 2 by Dr. Emelia Loron,  based on the following trauma criteria Elderly patients > 65 with head trauma on anti-coagulation (excluding ASA).  Patient cleared for CT by Dr. Doran Durand. Pt transported to CT with trauma response nurse present to monitor. RN remained with the patient throughout their absence from the department for clinical observation.   GCS 15.  History   Past Medical History:  Diagnosis Date   CAD (coronary artery disease)    mild nonobstructive disease on cath in 2003   Cancer [facility])    Cardiomyopathy    Probable Takotsubo, severe CP w/ normal cath in 1994. Severe CP in 2003 w/ widespread T wave inversions on ECG. Cath w/ minimal coronary disease but LV-gram showed periapical severe hypokinesis and basilar hyperkinesia (EF 40%). Last echo in 4/09 confirmed full LV functional recovery with EF 60%, no regional wall motion abnormalities, mild to moderate LVH.   Chronic diastolic CHF (congestive heart failure) (HCC) 09/13/2019   CVA (cerebral infarction)    Depression with anxiety 03/29/2011   lost husband 3'09   DVT (deep venous thrombosis) (HCC)    after venous ablation, R leg   Fracture 09/10/2007   L2, status post vertebroplasty of L2 performed by IR   GERD (gastroesophageal reflux disease)    Hemorrhoids    Hiatal hernia 03/29/2011   no nerve problems   Hyperlipidemia    Hyperplastic colon polyp 06/2001   Hypertension    Iron deficiency anemia due to chronic blood loss 12/30/2017   OA (osteoarthritis) of knee 03/29/2011   w/ bilateral knee pain-not a problem now   OSA (obstructive sleep apnea) 03/29/2011   no cpap used, not a problem now.   Osteoporosis    Otalgia of both ears    Dr. Emeline Darling   Polycythemia    Stroke [facility]) 03/29/2011   CVA x2 -last 10'12-?TIA(visual problems)   Varicose vein       Past Surgical History:  Procedure Laterality Date   APPENDECTOMY     BACK SURGERY     BREAST BIOPSY     BREAST CYST EXCISION     BREAST LUMPECTOMY Left 2013   left stage I left breast cancer   CHOLECYSTECTOMY  04/09/2011   Procedure: LAPAROSCOPIC CHOLECYSTECTOMY WITH INTRAOPERATIVE CHOLANGIOGRAM;  Surgeon: Adolph Pollack, MD;  Location: [facility] ORS;  Service: General;  Laterality: N/A;   CORONARY ATHERECTOMY N/A 02/26/2022   Procedure: CORONARY ATHERECTOMY;  Surgeon: Yvonne Kendall, MD;  Location: [facility] INVASIVE CV LAB;  Service: Cardiovascular;  Laterality: N/A;   CORONARY LITHOTRIPSY N/A 02/26/2022   Procedure: CORONARY LITHOTRIPSY;  Surgeon: Yvonne Kendall, MD;  Location: [facility] INVASIVE CV LAB;  Service: Cardiovascular;  Laterality: N/A;   CORONARY STENT INTERVENTION N/A 02/26/2022   Procedure: CORONARY STENT INTERVENTION;  Surgeon: Yvonne Kendall, MD;  Location: [facility] INVAS[facility] CV LAB;  Service: Cardiovascular;  Laterality: N/A;   CORONARY ULTRASOUND/IVUS N/A 02/26/2022   Procedure: Intravascular Ultrasound/IVUS;  Surgeon: Yvonne Kendall, MD;  Location: [facility] INV[facility] CV [facility];  Service: Cardiovascular;  Laterality: N/A;   Dental Extraction     L maxillary molar   FEMUR IM NAIL Right 07/25/2022   Procedure: INTRAMEDULLARY (IM) NAIL FEMORAL;  Surgeon: Yolonda Kida, MD;  Location[facility];  Service: Orthopedics;  Laterality: Right;  hana table,carm.synthesis   INTRAMEDULLARY (IM) NAIL INTERTROCHANTERIC Left  12/25/2019   Procedure: INTRAMEDULLARY (IM) NAIL INTERTROCHANTRIC;  Surgeon: Durene Romans, MD;  Location: [facility] ORS;  Service: Orthopedics;  Laterality: Left;   IR KYPHO THORACIC WITH BONE BIOPSY  09/15/2019   IR KYPHO THORACIC WITH BONE BIOPSY  07/13/2020       IR KYPHO THORACIC WITH BONE BIOPSY  07/13/2020   IR VERTEBROPLASTY CERV/THOR BX INC UNI/BIL INC/INJECT/IMAGING  11/10/2019   KYPHOPLASTY N/A 11/16/2020   Procedure: T11,  L1,KYPHOPLASTY;  Surgeon: Venita Lick, MD;  Location: [facility] OR;   Service: Orthopedics;  Laterality: N/A;   KYPHOSIS SURGERY  08/2007   cement used   LEFT HEART CATH AND CORONARY ANGIOGRAPHY N/A 02/23/2022   Procedure: LEFT HEART CATH AND CORONARY ANGIOGRAPHY;  Surgeon: Swazil[place], Peter M, MD;  Location[facility] INVASIVE CV LAB;  Service: Cardiovascular;  Laterality: N/A;   LEFT HEART CATH AND CORONARY ANGIOGRAPHY N/A 02/26/2022   Procedure: LEFT HEART CATH AND CORONARY ANGIOGRAPHY;  Surgeon: Yvonne Kendall, MD;  Location: [facility] INV[facility] CV [facility];  Service: Cardiovascular;  Laterality: N/A;   OVARIAN CYST SURGERY     left   RADIOLOGY WITH ANESTHESIA N/A 11/10/2019   Procedure: VERTEBROPLASTY;  Surgeon: Julieanne Cotton, MD;  Location: [facility] OR;  Service: Radiology;  Laterality: N/A;   TEMPORARY PACEMAKER N/A 02/26/2022   Procedure: TEMPORARY PACEMAKER;  Surgeon: Yvonne Kendall, MD;  Location: [facility] INV[facility] CV [facility];  Service: Cardiovascular;  Laterality: N/A;   TONSILLECTOMY     TOTAL KNEE ARTHROPLASTY  06/2010   left   TUBAL LIGATION         Initial Focused Assessment (If applicable, or please see trauma documentation): Airway-- intact, no visible obstruction Breathing-- labored, Spo2 68% on room air, placed on 4L [place], SpO2 up to 96% Circulation-- no apparent bleeding noted  CT's Completed:   CT Head, CT C-Spine, CT Chest w/ contrast, and CT abdomen/pelvis w/ contrast, CT L-spine, CT T-spine  Interventions:  See event summary  Plan for disposition:  {Trauma Dispo:26867}   Consults completed:  {Trauma Consults:26862} at ***.  Event Summary: Patient brought in by [facility]. Patient fell at nursing facility this evening while getting up from her recliner. EMS noted SpO2 68% on room air, placed on 4 L [place], SpO2 up to 96%. Patient alert and oriented x4, GCS 15. Patient main complaint of right shoulder pain. Trauma labs obtained. Xray chest and pelvis completed. 20 G PIV established. 25 mcg fentanyl administered. Patient to CT with TRN. CT head, c-spine, t-spine,  l-spine, chest/abdomen/pelvis completed.  MTP Summary (If applicable):  N/A  Bedside handoff with ED RN Juliette Alcide.    Leota Sauers  Trauma Response RN  Please call TRN at [phone] for further assistance.

## 2022-09-29 ENCOUNTER — Inpatient Hospital Stay (HOSPITAL_COMMUNITY): Payer: Medicare Other

## 2022-09-29 DIAGNOSIS — R0902 Hypoxemia: Secondary | ICD-10-CM | POA: Diagnosis not present

## 2022-09-29 DIAGNOSIS — S12600A Unspecified displaced fracture of seventh cervical vertebra, initial encounter for closed fracture: Secondary | ICD-10-CM | POA: Diagnosis present

## 2022-09-29 DIAGNOSIS — R2689 Other abnormalities of gait and mobility: Secondary | ICD-10-CM | POA: Diagnosis not present

## 2022-09-29 DIAGNOSIS — I7300 Raynaud's syndrome without gangrene: Secondary | ICD-10-CM | POA: Diagnosis not present

## 2022-09-29 DIAGNOSIS — I6381 Other cerebral infarction due to occlusion or stenosis of small artery: Secondary | ICD-10-CM | POA: Diagnosis not present

## 2022-09-29 DIAGNOSIS — I2510 Atherosclerotic heart disease of native coronary artery without angina pectoris: Secondary | ICD-10-CM | POA: Diagnosis present

## 2022-09-29 DIAGNOSIS — D751 Secondary polycythemia: Secondary | ICD-10-CM | POA: Diagnosis present

## 2022-09-29 DIAGNOSIS — J9601 Acute respiratory failure with hypoxia: Secondary | ICD-10-CM | POA: Diagnosis present

## 2022-09-29 DIAGNOSIS — S065XAA Traumatic subdural hemorrhage with loss of consciousness status unknown, initial encounter: Secondary | ICD-10-CM | POA: Diagnosis not present

## 2022-09-29 DIAGNOSIS — Z66 Do not resuscitate: Secondary | ICD-10-CM | POA: Diagnosis not present

## 2022-09-29 DIAGNOSIS — F17210 Nicotine dependence, cigarettes, uncomplicated: Secondary | ICD-10-CM | POA: Diagnosis present

## 2022-09-29 DIAGNOSIS — W19XXXD Unspecified fall, subsequent encounter: Secondary | ICD-10-CM | POA: Diagnosis not present

## 2022-09-29 DIAGNOSIS — I428 Other cardiomyopathies: Secondary | ICD-10-CM | POA: Diagnosis not present

## 2022-09-29 DIAGNOSIS — S22030A Wedge compression fracture of third thoracic vertebra, initial encounter for closed fracture: Secondary | ICD-10-CM | POA: Diagnosis not present

## 2022-09-29 DIAGNOSIS — E871 Hypo-osmolality and hyponatremia: Secondary | ICD-10-CM | POA: Diagnosis not present

## 2022-09-29 DIAGNOSIS — S22030D Wedge compression fracture of third thoracic vertebra, subsequent encounter for fracture with routine healing: Secondary | ICD-10-CM | POA: Diagnosis not present

## 2022-09-29 DIAGNOSIS — S22069A Unspecified fracture of T7-T8 vertebra, initial encounter for closed fracture: Secondary | ICD-10-CM | POA: Diagnosis not present

## 2022-09-29 DIAGNOSIS — W19XXXA Unspecified fall, initial encounter: Secondary | ICD-10-CM | POA: Diagnosis present

## 2022-09-29 DIAGNOSIS — E785 Hyperlipidemia, unspecified: Secondary | ICD-10-CM | POA: Diagnosis present

## 2022-09-29 DIAGNOSIS — Z7189 Other specified counseling: Secondary | ICD-10-CM | POA: Diagnosis not present

## 2022-09-29 DIAGNOSIS — F419 Anxiety disorder, unspecified: Secondary | ICD-10-CM | POA: Diagnosis present

## 2022-09-29 DIAGNOSIS — C50919 Malignant neoplasm of unspecified site of unspecified female breast: Secondary | ICD-10-CM | POA: Diagnosis not present

## 2022-09-29 DIAGNOSIS — R296 Repeated falls: Secondary | ICD-10-CM | POA: Diagnosis not present

## 2022-09-29 DIAGNOSIS — C50011 Malignant neoplasm of nipple and areola, right female breast: Secondary | ICD-10-CM | POA: Diagnosis not present

## 2022-09-29 DIAGNOSIS — Z743 Need for continuous supervision: Secondary | ICD-10-CM | POA: Diagnosis not present

## 2022-09-29 DIAGNOSIS — I429 Cardiomyopathy, unspecified: Secondary | ICD-10-CM | POA: Diagnosis present

## 2022-09-29 DIAGNOSIS — K219 Gastro-esophageal reflux disease without esophagitis: Secondary | ICD-10-CM | POA: Diagnosis present

## 2022-09-29 DIAGNOSIS — E43 Unspecified severe protein-calorie malnutrition: Secondary | ICD-10-CM | POA: Diagnosis not present

## 2022-09-29 DIAGNOSIS — R531 Weakness: Secondary | ICD-10-CM | POA: Diagnosis not present

## 2022-09-29 DIAGNOSIS — I5181 Takotsubo syndrome: Secondary | ICD-10-CM | POA: Diagnosis present

## 2022-09-29 DIAGNOSIS — I110 Hypertensive heart disease with heart failure: Secondary | ICD-10-CM | POA: Diagnosis present

## 2022-09-29 DIAGNOSIS — Z955 Presence of coronary angioplasty implant and graft: Secondary | ICD-10-CM | POA: Diagnosis not present

## 2022-09-29 DIAGNOSIS — Z9181 History of falling: Secondary | ICD-10-CM | POA: Diagnosis not present

## 2022-09-29 DIAGNOSIS — R2681 Unsteadiness on feet: Secondary | ICD-10-CM | POA: Diagnosis not present

## 2022-09-29 DIAGNOSIS — R011 Cardiac murmur, unspecified: Secondary | ICD-10-CM | POA: Diagnosis not present

## 2022-09-29 DIAGNOSIS — D509 Iron deficiency anemia, unspecified: Secondary | ICD-10-CM | POA: Diagnosis present

## 2022-09-29 DIAGNOSIS — I252 Old myocardial infarction: Secondary | ICD-10-CM | POA: Diagnosis not present

## 2022-09-29 DIAGNOSIS — F17200 Nicotine dependence, unspecified, uncomplicated: Secondary | ICD-10-CM | POA: Diagnosis not present

## 2022-09-29 DIAGNOSIS — F32A Depression, unspecified: Secondary | ICD-10-CM | POA: Diagnosis present

## 2022-09-29 DIAGNOSIS — S22039A Unspecified fracture of third thoracic vertebra, initial encounter for closed fracture: Secondary | ICD-10-CM | POA: Diagnosis present

## 2022-09-29 DIAGNOSIS — M6281 Muscle weakness (generalized): Secondary | ICD-10-CM | POA: Diagnosis not present

## 2022-09-29 DIAGNOSIS — I2699 Other pulmonary embolism without acute cor pulmonale: Secondary | ICD-10-CM | POA: Diagnosis not present

## 2022-09-29 DIAGNOSIS — I10 Essential (primary) hypertension: Secondary | ICD-10-CM | POA: Diagnosis not present

## 2022-09-29 DIAGNOSIS — I480 Paroxysmal atrial fibrillation: Secondary | ICD-10-CM | POA: Diagnosis present

## 2022-09-29 DIAGNOSIS — G8929 Other chronic pain: Secondary | ICD-10-CM | POA: Diagnosis present

## 2022-09-29 DIAGNOSIS — E876 Hypokalemia: Secondary | ICD-10-CM | POA: Diagnosis present

## 2022-09-29 DIAGNOSIS — I5032 Chronic diastolic (congestive) heart failure: Secondary | ICD-10-CM | POA: Diagnosis not present

## 2022-09-29 DIAGNOSIS — S12690A Other displaced fracture of seventh cervical vertebra, initial encounter for closed fracture: Secondary | ICD-10-CM | POA: Diagnosis not present

## 2022-09-29 DIAGNOSIS — D500 Iron deficiency anemia secondary to blood loss (chronic): Secondary | ICD-10-CM | POA: Diagnosis not present

## 2022-09-29 DIAGNOSIS — M4852XA Collapsed vertebra, not elsewhere classified, cervical region, initial encounter for fracture: Secondary | ICD-10-CM | POA: Diagnosis present

## 2022-09-29 DIAGNOSIS — Z515 Encounter for palliative care: Secondary | ICD-10-CM | POA: Diagnosis not present

## 2022-09-29 DIAGNOSIS — I25110 Atherosclerotic heart disease of native coronary artery with unstable angina pectoris: Secondary | ICD-10-CM | POA: Diagnosis not present

## 2022-09-29 DIAGNOSIS — M4854XA Collapsed vertebra, not elsewhere classified, thoracic region, initial encounter for fracture: Secondary | ICD-10-CM | POA: Diagnosis present

## 2022-09-29 DIAGNOSIS — Z7401 Bed confinement status: Secondary | ICD-10-CM | POA: Diagnosis not present

## 2022-09-29 DIAGNOSIS — S065XAD Traumatic subdural hemorrhage with loss of consciousness status unknown, subsequent encounter: Secondary | ICD-10-CM | POA: Diagnosis not present

## 2022-09-29 LAB — I-STAT ARTERIAL BLOOD GAS, ED
Acid-Base Excess: 4 mmol/L — ABNORMAL HIGH (ref 0.0–2.0)
Bicarbonate: 29.9 mmol/L — ABNORMAL HIGH (ref 20.0–28.0)
Calcium, Ion: 1.19 mmol/L (ref 1.15–1.40)
HCT: 40 % (ref 36.0–46.0)
Hemoglobin: 13.6 g/dL (ref 12.0–15.0)
O2 Saturation: 87 %
Patient temperature: 97.7
Potassium: 2.8 mmol/L — ABNORMAL LOW (ref 3.5–5.1)
Sodium: 134 mmol/L — ABNORMAL LOW (ref 135–145)
TCO2: 31 mmol/L (ref 22–32)
pCO2 arterial: 49.5 mmHg — ABNORMAL HIGH (ref 32–48)
pH, Arterial: 7.386 (ref 7.35–7.45)
pO2, Arterial: 54 mmHg — ABNORMAL LOW (ref 83–108)

## 2022-09-29 LAB — BASIC METABOLIC PANEL
Anion gap: 12 (ref 5–15)
Anion gap: 8 (ref 5–15)
BUN: 11 mg/dL (ref 8–23)
BUN: 10 mg/dL (ref 8–23)
CO2: 27 mmol/L (ref 22–32)
CO2: 30 mmol/L (ref 22–32)
Calcium: 8.5 mg/dL — ABNORMAL LOW (ref 8.9–10.3)
Calcium: 8.4 mg/dL — ABNORMAL LOW (ref 8.9–10.3)
Chloride: 96 mmol/L — ABNORMAL LOW (ref 98–111)
Chloride: 96 mmol/L — ABNORMAL LOW (ref 98–111)
Creatinine, Ser: 0.68 mg/dL (ref 0.44–1.00)
Creatinine, Ser: 0.57 mg/dL (ref 0.44–1.00)
GFR, Estimated: >60 mL/min (ref >60)
GFR, Estimated: >60 mL/min (ref >60)
Glucose, Bld: 160 mg/dL — ABNORMAL HIGH (ref 70–99)
Glucose, Bld: 153 mg/dL — ABNORMAL HIGH (ref 70–99)
Potassium: 2.9 mmol/L — ABNORMAL LOW (ref 3.5–5.1)
Potassium: 3.7 mmol/L (ref 3.5–5.1)
Sodium: 135 mmol/L (ref 135–145)
Sodium: 134 mmol/L — ABNORMAL LOW (ref 135–145)

## 2022-09-29 LAB — TROPONIN I (HIGH SENSITIVITY): Troponin I (High Sensitivity): 9 ng/L (ref <18)

## 2022-09-29 LAB — MAGNESIUM: Magnesium: 1.9 mg/dL (ref 1.7–2.4)

## 2022-09-29 LAB — CBC
HCT: 40.1 % (ref 36.0–46.0)
Hemoglobin: 11.9 g/dL — ABNORMAL LOW (ref 12.0–15.0)
MCH: 24.8 pg — ABNORMAL LOW (ref 26.0–34.0)
MCHC: 29.7 g/dL — ABNORMAL LOW (ref 30.0–36.0)
MCV: 83.7 fL (ref 80.0–100.0)
Platelets: 398 10*3/uL (ref 150–400)
RBC: 4.79 MIL/uL (ref 3.87–5.11)
RDW: 16.7 % — ABNORMAL HIGH (ref 11.5–15.5)
WBC: 14.3 10*3/uL — ABNORMAL HIGH (ref 4.0–10.5)
nRBC: 0 % (ref 0.0–0.2)

## 2022-09-29 LAB — POC OCCULT BLOOD, ED: Fecal Occult Bld: NEGATIVE

## 2022-09-29 LAB — BRAIN NATRIURETIC PEPTIDE: B Natriuretic Peptide: 222 pg/mL — ABNORMAL HIGH (ref 0.0–100.0)

## 2022-09-29 MED ORDER — IPRATROPIUM-ALBUTEROL 0.5-2.5 (3) MG/3ML IN SOLN: 3.0000 mL | RESPIRATORY_TRACT | Status: DC | PRN

## 2022-09-29 MED ORDER — ONDANSETRON HCL 4 MG/2ML IJ SOLN
4.0000 mg | Freq: Once | INTRAMUSCULAR | Status: AC
  Administered 2022-09-29: 4 mg via INTRAVENOUS
  Filled 2022-09-29: qty 2

## 2022-09-29 MED ORDER — AMIODARONE HCL 200 MG PO TABS
200.0000 mg | ORAL_TABLET | Freq: Every day | ORAL | Status: DC
  Administered 2022-09-29 – 2022-10-08 (×10): 200 mg via ORAL
  Filled 2022-09-29 (×11): qty 1

## 2022-09-29 MED ORDER — MORPHINE SULFATE (PF) 4 MG/ML IV SOLN
4.0000 mg | Freq: Once | INTRAVENOUS | Status: AC
  Administered 2022-09-29: 4 mg via INTRAVENOUS
  Filled 2022-09-29: qty 1

## 2022-09-29 MED ORDER — IPRATROPIUM-ALBUTEROL 0.5-2.5 (3) MG/3ML IN SOLN
3.0000 mL | Freq: Once | RESPIRATORY_TRACT | Status: AC
  Administered 2022-09-29: 3 mL via RESPIRATORY_TRACT
  Filled 2022-09-29: qty 3

## 2022-09-29 MED ORDER — POLYETHYLENE GLYCOL 3350 17 G PO PACK: 17.0000 g | PACK | Freq: Every day | ORAL | Status: DC | PRN

## 2022-09-29 MED ORDER — OXYCODONE HCL 5 MG PO TABS
5.0000 mg | ORAL_TABLET | Freq: Four times a day (QID) | ORAL | Status: AC | PRN
  Administered 2022-09-29 – 2022-09-30 (×4): 5 mg via ORAL
  Filled 2022-09-29 (×4): qty 1

## 2022-09-29 MED ORDER — ACETAMINOPHEN 325 MG PO TABS
650.0000 mg | ORAL_TABLET | Freq: Four times a day (QID) | ORAL | Status: DC | PRN
  Administered 2022-09-29 – 2022-09-30 (×2): 650 mg via ORAL
  Filled 2022-09-29 (×2): qty 2

## 2022-09-29 MED ORDER — HYDROMORPHONE HCL 1 MG/ML IJ SOLN
0.5000 mg | INTRAMUSCULAR | Status: AC | PRN
  Administered 2022-09-29 – 2022-09-30 (×4): 0.5 mg via INTRAVENOUS
  Filled 2022-09-29 (×3): qty 0.5
  Filled 2022-09-29: qty 1

## 2022-09-29 MED ORDER — SERTRALINE HCL 25 MG PO TABS
12.5000 mg | ORAL_TABLET | Freq: Every day | ORAL | Status: DC
  Administered 2022-09-29 – 2022-10-08 (×10): 12.5 mg via ORAL
  Filled 2022-09-29 (×10): qty 1

## 2022-09-29 MED ORDER — POTASSIUM CHLORIDE 10 MEQ/100ML IV SOLN
10.0000 meq | INTRAVENOUS | Status: AC
  Administered 2022-09-29 (×4): 10 meq via INTRAVENOUS
  Filled 2022-09-29 (×4): qty 100

## 2022-09-29 MED ORDER — LORAZEPAM 0.5 MG PO TABS
0.5000 mg | ORAL_TABLET | Freq: Two times a day (BID) | ORAL | Status: DC | PRN
  Administered 2022-09-30 – 2022-10-05 (×7): 0.5 mg via ORAL
  Filled 2022-09-29 (×8): qty 1

## 2022-09-29 MED ORDER — PROCHLORPERAZINE EDISYLATE 10 MG/2ML IJ SOLN
5.0000 mg | Freq: Four times a day (QID) | INTRAMUSCULAR | Status: DC | PRN
  Administered 2022-09-29 – 2022-09-30 (×4): 5 mg via INTRAVENOUS
  Filled 2022-09-29 (×4): qty 2

## 2022-09-29 MED ORDER — ROSUVASTATIN CALCIUM 20 MG PO TABS
20.0000 mg | ORAL_TABLET | Freq: Every day | ORAL | Status: DC
  Administered 2022-09-29 – 2022-10-08 (×10): 20 mg via ORAL
  Filled 2022-09-29 (×10): qty 1

## 2022-09-29 MED ORDER — DOCUSATE SODIUM 100 MG PO CAPS
100.0000 mg | ORAL_CAPSULE | Freq: Two times a day (BID) | ORAL | Status: DC
  Administered 2022-09-29 – 2022-10-09 (×20): 100 mg via ORAL
  Filled 2022-09-29 (×21): qty 1

## 2022-09-29 MED ORDER — PANTOPRAZOLE SODIUM 40 MG PO TBEC
40.0000 mg | DELAYED_RELEASE_TABLET | Freq: Every day | ORAL | Status: DC
  Administered 2022-09-29 – 2022-10-09 (×11): 40 mg via ORAL
  Filled 2022-09-29 (×11): qty 1

## 2022-09-29 NOTE — Progress Notes (Signed)
[facility] PLAN OF CARE NOTE Patient: [name] ZOX:[id number]   PCP: [facility] DOB: [birth year]   DOA: 09/28/2022   DOS: 09/29/2022    Patient was admitted by my colleague earlier on 09/29/2022. I have reviewed the H&P as well as assessment and plan and agree with the same. Important changes in the plan are listed below.  Plan of care: Principal Problem:   Subdural hematoma (HCC) Repeat CT scan on 5/18 shows no evidence of acute worsening. Neurosurgery recommended no intervention necessary. Discussed with neurosurgery personally recommended that patient can be safely discharged from their point of view. IR consulted for kyphoplasty.  Currently awaiting recommendation most likely will be available on Monday. Continue c-collar can only remove for shower. Patient reported some chest pain.  EKG unremarkable troponin negative. Hypokalemia was an issue which is currently resolved as well. Continue Ativan per home regimen. Level of care: Telemetry Medical Continue telemetry.  Author: Lynden Oxford, MD [facility] 09/29/2022 3:52 PM   If 7PM-7AM, please contact night-coverage at [email or web address]

## 2022-09-29 NOTE — H&P (Addendum)
History and Physical  [name]:[id number] DOB: 11/23/1929 DOA: 09/28/2022  Referring physician: Army Melia, PA-EDP  PCP: [facility]  Outpatient Specialists: Oncology, orthopedic surgery. Patient coming from: SNF.  Chief Complaint: Fall  HPI: [name] is a 89 y.o. female with medical history significant for hypertension, polycythemia, chronic anxiety/depression, GERD, paroxysmal A-fib on Eliquis, history of PE/DVT, history of stroke, who presented to [facility] ED from SNF due to an unwitnessed fall.  Was trying to get from recliner to wheelchair and fell on her back.  EMS was activated.  Upon arrival, a c-collar was placed.  The patient was saturating 68% on room air.  Not on O2 supplementation at baseline.  She was placed on 4 L nasal cannula and brought to the ED for further evaluation.  In the ED, imaging revealed subdural hematoma seen on CT his head.  New T3 compression fracture, new C7 mild compression fracture.  EDP discussed the case with neurosurgery who recommended admission for observation and repeat the CT head in 6 hours.  Admitted by [facility], hospitalist service.  ED Course: Temperature 97.9.  BP 126/60, pulse 58, respiratory 18, saturation 92% on 6 L.  Lab studies remarkable for serum sodium 134, potassium 2.8.  BNP 222.  WBC 13.6.  Review of Systems: Review of systems as noted in the HPI. All other systems reviewed and are negative.   Past Medical History:  Diagnosis Date   CAD (coronary artery disease)    mild nonobstructive disease on cath in 2003   Cancer [facility])    Cardiomyopathy    Probable Takotsubo, severe CP w/ normal cath in 1994. Severe CP in 2003 w/ widespread T wave inversions on ECG. Cath w/ minimal coronary disease but LV-gram showed periapical severe hypokinesis and basilar hyperkinesia (EF 40%). Last echo in 4/09 confirmed full LV functional recovery with EF 60%, no regional wall motion abnormalities, mild to moderate LVH.    Chronic diastolic CHF (congestive heart failure) (HCC) 09/13/2019   CVA (cerebral infarction)    Depression with anxiety 03/29/2011   lost husband 3'09   DVT (deep venous thrombosis) (HCC)    after venous ablation, R leg   Fracture 09/10/2007   L2, status post vertebroplasty of L2 performed by IR   GERD (gastroesophageal reflux disease)    Hemorrhoids    Hiatal hernia 03/29/2011   no nerve problems   Hyperlipidemia    Hyperplastic colon polyp 06/2001   Hypertension    Iron deficiency anemia due to chronic blood loss 12/30/2017   OA (osteoarthritis) of knee 03/29/2011   w/ bilateral knee pain-not a problem now   OSA (obstructive sleep apnea) 03/29/2011   no cpap used, not a problem now.   Osteoporosis    Otalgia of both ears    Dr. Emeline Darling   Polycythemia    Stroke [facility]) 03/29/2011   CVA x2 -last 10'12-?TIA(visual problems)   Varicose vein    Past Surgical History:  Procedure Laterality Date   APPENDECTOMY     BACK SURGERY     BREAST BIOPSY     BREAST CYST EXCISION     BREAST LUMPECTOMY Left 2013   left stage I left breast cancer   CHOLECYSTECTOMY  04/09/2011   Procedure: LAPAROSCOPIC CHOLECYSTECTOMY WITH INTRAOPERATIVE CHOLANGIOGRAM;  Surgeon: Adolph Pollack, MD;  Location: [facility] ORS;  Service: General;  Laterality: N/A;   CORONARY ATHERECTOMY N/A 02/26/2022   Procedure: CORONARY ATHERECTOMY;  Surgeon: Yvonne Kendall, MD;  Location: [facility] INVASIVE CV  LAB;  Service: Cardiovascular;  Laterality: N/A;   CORONARY LITHOTRIPSY N/A 02/26/2022   Procedure: CORONARY LITHOTRIPSY;  Surgeon: Yvonne Kendall, MD;  Location: [facility] INVASIVE CV LAB;  Service: Cardiovascular;  Laterality: N/A;   CORONARY STENT INTERVENTION N/A 02/26/2022   Procedure: CORONARY STENT INTERVENTION;  Surgeon: Yvonne Kendall, MD;  Location: [facility] INVAS[facility] CV LAB;  Service: Cardiovascular;  Laterality: N/A;   CORONARY ULTRASOUND/IVUS N/A 02/26/2022   Procedure: Intravascular Ultrasound/IVUS;  Surgeon: Yvonne Kendall,  MD;  Location: [facility] CV LAB;  Service: Cardiovascular;  Laterality: N/A;   Dental Extraction     L maxillary molar   FEMUR IM NAIL Right 07/25/2022   Procedure: INTRAMEDULLARY (IM) NAIL FEMORAL;  Surgeon: Yolonda Kida, MD;  Location: [facility] OR;  Service: Orthopedics;  Laterality: Right;  hana table,carm.synthesis   INTRAMEDULLARY (IM) NAIL INTERTROCHANTERIC Left 12/25/2019   Procedure: INTRAMEDULLARY (IM) NAIL INTERTROCHANTRIC;  Surgeon: Durene Romans, MD;  Location: [facility] ORS;  Service: Orthopedics;  Laterality: Left;   IR KYPHO THORACIC WITH BONE BIOPSY  09/15/2019   IR KYPHO THORACIC WITH BONE BIOPSY  07/13/2020       IR KYPHO THORACIC WITH BONE BIOPSY  07/13/2020   IR VERTEBROPLASTY CERV/THOR BX INC UNI/BIL INC/INJECT/IMAGING  11/10/2019   KYPHOPLASTY N/A 11/16/2020   Procedure: T11,  L1,KYPHOPLASTY;  Surgeon: Venita Lick, MD;  Location: [facility] OR;  Service: Orthopedics;  Laterality: N/A;   KYPHOSIS SURGERY  08/2007   cement used   LEFT HEART CATH AND CORONARY ANGIOGRAPHY N/A 02/23/2022   Procedure: LEFT HEART CATH AND CORONARY ANGIOGRAPHY;  Surgeon: [place]il[place], Peter M, MD;  Location: [facility] INVASIVE CV LAB;  Service: Cardiovascular;  Laterality: N/A;   LEFT HEART CATH AND CORONARY ANGIOGRAPHY N/A 02/26/2022   Procedure: LEFT HEART CATH AND CORONARY ANGIOGRAPHY;  Surgeon: Yvonne Kendall, MD;  Location: [facility];  Service: Cardiovascular;  Laterality: N/A;   OVARIAN CYST SURGERY     left   RADIOLOGY WITH ANESTHESIA N/A 11/10/2019   Procedure: VERTEBROPLASTY;  Surgeon: Julieanne Cotton, MD;  Location: [facility] OR;  Service: Radiology;  Laterality: N/A;   TEMPORARY PACEMAKER N/A 02/26/2022   Procedure: TEMPORARY PACEMAKER;  Surgeon: Yvonne Kendall, MD;  [facility]: [facility] CV [facility];  Service: Cardiovascular;  Laterality: N/A;   TONSILLECTOMY     TOTAL KNEE ARTHROPLASTY  06/2010   left   TUBAL LIGATION      Social History:  reports that she has been smoking cigarettes. She started smoking  about 71 years ago. She has a 37.00 pack-year smoking history. She has never used smokeless tobacco. She reports that she does not currently use alcohol after a past usage of about 1.0 standard drink of alcohol per week. She reports that she does not use drugs.   Allergies  Allergen Reactions   Penicillins Hives, Rash and Other (See Comments)    Has patient had a PCN reaction causing immediate rash, facial/tongue/throat swelling, SOB or lightheadedness with hypotension: Yes Has patient had a PCN reaction causing severe rash involving mucus membranes or skin necrosis: yes Has patient had a PCN reaction that required hospitalization: No Has patient had a PCN reaction occurring within the last 10 years: no If all of the above answers are "NO", then may proceed with Cephalosporin use.  Other Reaction: OTHER REACTION   Valsartan Itching and Other (See Comments)   Atorvastatin Diarrhea   Carvedilol Other (See Comments)    "teeth hurt when I took it"    Ezetimibe Nausea Only   Fluvastatin  Sodium Other (See Comments)    "Can't remember"   Magnesium Hydroxide Other (See Comments)    REACTION: triggers HAs   Meloxicam Other (See Comments)    Pt seeing auro's / spots.     Pneumovax [Pneumococcal Polysaccharide Vaccine] Other (See Comments)    Local reaction   Quinapril Hcl Other (See Comments)     "feelings of tiredness"   Simvastatin Diarrhea   Topamax [Topiramate] Itching   Vit D-Vit E-Safflower Oil Other (See Comments)    Headaches    Family History  Problem Relation Age of Onset   Breast cancer Mother    Heart disease Father        MIs   Colon cancer Other        cousin   Breast cancer Sister    Leukemia Brother        GM      Prior to Admission medications   Medication Sig Start Date End Date Taking? Authorizing Provider  acetaminophen (TYLENOL) 325 MG tablet Take 650 mg by mouth every 6 (six) hours as needed for mild pain.    [provider]  alendronate  (FOSAMAX) 70 MG tablet Take 1 tablet (70 mg total) by mouth once a week. Take with a full glass of water on an empty stomach. 12/19/21   Wanda Plump, MD  amiodarone (PACERONE) 200 MG tablet Take 200 mg by mouth daily. 06/18/22   [provider]  amLODipine (NORVASC) 5 MG tablet Take 1 tablet (5 mg total) by mouth daily. 12/19/21   Wanda Plump, MD  apixaban (ELIQUIS) 2.5 MG TABS tablet Take 1 tablet (2.5 mg total) by mouth 2 (two) times daily. Patient taking differently: Take 2.5 mg by mouth daily. 02/28/22   Arty Baumgartner, NP  calcium carbonate (TUMS - DOSED IN MG ELEMENTAL CALCIUM) 500 MG chewable tablet Chew 1 tablet (200 mg of elemental calcium total) by mouth 3 (three) times daily as needed for indigestion or heartburn. 07/28/22   Dorcas Carrow, MD  clopidogrel (PLAVIX) 75 MG tablet Take 1 tablet (75 mg total) by mouth daily. 03/01/22   Arty Baumgartner, NP  docusate sodium (COLACE) 100 MG capsule Take 1 capsule (100 mg total) by mouth 2 (two) times daily. 07/27/22   Dorcas Carrow, MD  furosemide (LASIX) 20 MG tablet Take 1 tablet (20 mg total) by mouth daily as needed for fluid or edema (for weigh gain of 3 pounds or more over one day). Patient taking differently: Take 40 mg by mouth daily. 02/28/22   Arty Baumgartner, NP  HYDROcodone-acetaminophen (NORCO/VICODIN) 5-325 MG tablet Take 1 tablet by mouth every 6 (six) hours as needed for moderate pain or severe pain. 07/27/22 07/27/23  Dorcas Carrow, MD  metoprolol tartrate (LOPRESSOR) 50 MG tablet Take 1 tablet (50 mg total) by mouth 2 (two) times daily. 12/19/21   Wanda Plump, MD  nitroGLYCERIN (NITROSTAT) 0.4 MG SL tablet Place 1 tablet (0.4 mg total) under the tongue every 5 (five) minutes x 3 doses as needed for chest pain. 02/28/22   Arty Baumgartner, NP  pantoprazole (PROTONIX) 40 MG tablet Take 1 tablet (40 mg total) by mouth at bedtime. Patient taking differently: Take 40 mg by mouth daily. 02/28/22   Arty Baumgartner, NP   polyethylene glycol (MIRALAX / GLYCOLAX) 17 g packet Take 17 g by mouth daily. 07/27/22   Dorcas Carrow, MD  rosuvastatin (CRESTOR) 20 MG tablet Take 1 tablet (20 mg total) by mouth  daily. Patient taking differently: Take 20 mg by mouth at bedtime. 03/01/22   Arty Baumgartner, NP  sertraline (ZOLOFT) 25 MG tablet Take 12.5 mg by mouth at bedtime. 06/18/22   [provider]    Physical Exam: BP 126/60   Pulse (!) 58   Temp 97.9 F (36.6 C) (Oral)   Resp 18   SpO2 92%   General: 89 y.o. year-old female well developed well nourished in no acute distress.  Alert and oriented x3. Cardiovascular: Regular rate and rhythm with no rubs or gallops.  No thyromegaly or JVD noted.  No lower extremity edema. 2/4 pulses in all 4 extremities. Respiratory: Clear to auscultation with no wheezes or rales. Good inspiratory effort. Abdomen: Soft nontender nondistended with normal bowel sounds x4 quadrants. Muskuloskeletal: No cyanosis, clubbing or edema noted bilaterally Neuro: CN II-XII intact, strength, sensation, reflexes Skin: No ulcerative lesions noted or rashes Psychiatry: Judgement and insight appear normal. Mood is appropriate for condition and setting          Labs on Admission:  Basic Metabolic Panel: Recent Labs  Lab 09/28/22 2256 09/28/22 2257 09/29/22 0215  NA 132* 134* 134*  K 3.0* 3.2* 2.8*  CL 94* 94*  --   CO2 27  --   --   GLUCOSE 128* 124*  --   BUN 9 12  --   CREATININE 0.69 0.60  --   CALCIUM 8.6*  --   --    Liver Function Tests: Recent Labs  Lab 09/28/22 2256  AST 24  ALT 19  ALKPHOS 78  BILITOT 1.2  PROT 6.6  ALBUMIN 3.4*   No results for input(s): "LIPASE", "AMYLASE" in the last 168 hours. No results for input(s): "AMMONIA" in the last 168 hours. CBC: Recent Labs  Lab 09/28/22 2256 09/28/22 2257 09/29/22 0215  WBC 13.6*  --   --   HGB 12.7 15.3* 13.6  HCT 42.0 45.0 40.0  MCV 82.7  --   --   PLT 428*  --   --    Cardiac Enzymes: No  results for input(s): "CKTOTAL", "CKMB", "CKMBINDEX", "TROPONINI" in the last 168 hours.  BNP (last 3 results) Recent Labs    09/29/22 0219  BNP 222.0*    ProBNP (last 3 results) No results for input(s): "PROBNP" in the last 8760 hours.  CBG: No results for input(s): "GLUCAP" in the last 168 hours.  Radiological Exams on Admission: CT HEAD WO CONTRAST  Addendum Date: 09/29/2022   ADDENDUM REPORT: 09/29/2022 01:23 ADDENDUM: In retrospect the T7 compression fracture described above, does appear to have been present on thoracic spine plain films from 02/19/2022. There is increased sclerosis of the vertebral body compatible with an interval healing response, but the degree of compression and retropulsion seems unchanged. Electronically Signed   By: Almira Bar M.D.   On: 09/29/2022 01:23   Result Date: 09/29/2022 CLINICAL DATA:  Fall trauma with head trauma, additional blunt poly frequent falls. Trauma. EXAM: CT HEAD WITHOUT CONTRAST CT CERVICAL SPINE WITHOUT CONTRAST CT CHEST, ABDOMEN AND PELVIS WITHOUT CONTRAST TECHNIQUE: Contiguous axial images were obtained from the base of the skull through the vertex without intravenous contrast. Multidetector CT imaging of the cervical spine was performed without intravenous contrast. Multiplanar CT image reconstructions were also generated. Multidetector CT imaging of the chest, abdomen and pelvis was performed following the standard protocol without IV contrast. RADIATION DOSE REDUCTION: This exam was performed according to the departmental dose-optimization program which includes automated exposure control,  adjustment of the mA and/or kV according to patient size and/or use of iterative reconstruction technique. COMPARISON:  Head and cervical spine CT both obtained recently 09/24/2022, CTA chest, abdomen and pelvis 10/01/2021, abdomen and pelvis CT 10/03/2020. FINDINGS: CT HEAD FINDINGS Brain: Newly noted is a small acute 3 mm thick right parafalcine  subdural bleed in the high frontal area. There is no further evidence of intracranial hemorrhage. There is cerebral atrophy with mild atrophic ventriculomegaly, chronic inferior right frontal infarct, and extensive and fairly confluent hypoattenuating small-vessel disease of the cerebral white matter. There is a chronic right thalamic lacunar infarct and a small chronic infarct anteriorly in the left cerebellar hemisphere. No new cortical based infarct is seen, no mass effect or midline shift. Basal cisterns are clear. Vascular: There is calcification distal right vertebral artery and both siphons. No hyperdense central vessel is seen. Skull: There is osteopenia without evidence for depressed fractures. No scalp hematoma is seen. Sinuses/Orbits: Negative orbits. Clear paranasal sinuses with mild septal deviation to the right. Fluid again noted the bilateral mastoid tips, left-greater-than-right, with otherwise clear mastoids. Other: None. CT CERVICAL FINDINGS Alignment: Unchanged. No traumatic listhesis is suspected. A slight degenerative listhesis is again noted at C4-5 and C7-T1. Bone-on-bone anterior atlantodental joint space loss and osteophytosis is again shown. There is no C1-2 offset. Skull base and vertebrae: Generalized osteopenia. There is a chronic mild upper plate anterior wedge compression fracture of the T1 vertebral body. This appears unchanged. At C7, new from 09/24/2022 there is a slight loss of vertebral height of about 15% and mild anterior bulge newly seen in the contour of the anterior vertebral body, findings consistent with a new mild compression fracture. At T3, new since 10/01/2021 there is a compression fracture with up to 30-40% generalized vertebral height loss and mild fragmentation, slight outward spreading of fragments and retropulsion, without cord compromise. Kyphoplasty cement and partial vertebral height loss are unchanged at T4. Soft tissues and spinal canal: There is edema and  swelling around the new T3 compression fracture but no notable swelling at C7. There is no definitive spinal canal hematoma. Soft tissue resolution in the thoracic spine is less than in the cervical spine due to patient's overlying arms. There is moderate calcification in both proximal cervical ICAs. No laryngeal mass or thyroid nodules. Disc levels: Preservation of disc heights except for mild disc space loss at C5-6. There are mild facet joint and uncinate spurring changes at most levels, greater on the left, moderate left foraminal stenosis again noted C3-4, C4-5 and C6-7. No other significant foraminal stenosis. Other: None. CT CHEST FINDINGS Cardiovascular: Mild cardiomegaly. There are three-vessel coronary artery calcifications, aortic tortuosity, and moderate calcific plaques of the aorta and great vessels without aneurysm. Pulmonary arteries and veins appear normal in caliber. Mediastinum/Nodes: Calcified mediastinal and right hilar nodes. No adenopathy is seen without contrast. Moderate hiatal hernia is unchanged. Lungs/Pleura: Small layering pleural effusions new from prior study. No pneumothorax. The main bronchi are clear. There are scarring changes in the apices and bases increased atelectatic change in the left lower lobe versus consolidation. There is diffuse bronchial thickening. No nodules or further new findings. Musculoskeletal: Generalized osteopenia. There is a chronic fracture of the proximal right humerus. There are multiple bilateral chronic healed rib fracture deformities without acute displaced rib fracture. Rudimentary C7 cervical ribs.  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 Kyphoplasty cement is again noted in the T4 T8, T9, T10 and T12 vertebrae. In addition to the new compression  fracture described above at T3 there is also a new moderate to severe compression fracture of the T7 vertebral body, with 75-80% loss of the vertebral height in general, with about 4 mm of posteroinferior  cortical retropulsion. This mildly narrows the thecal sac eccentric to the left to 6.3 mm AP. Unchanged treated moderate wedging T8, T9, T10 and T12. No definitive evidence of a canal hematoma but there is increased soft tissue fullness alongside T3 and T7. CT ABDOMEN AND PELVIS FINDINGS Hepatobiliary: Increasingly dense liver measuring 20.6 cm length. Clinical correlation advised for amiodarone or heavy metal toxicity versus iron deposition. There are scattered calcified granulomas without visible mass. Gallbladder is absent with no biliary dilatation. Pancreas: No abnormality. Spleen: Costophrenic granulomas. Otherwise no further abnormality. No adjacent hematoma. Adrenals/Urinary Tract: No significant findings. No calculus. No bladder thickening or contour deforming mass of the kidneys. Stomach/Bowel: No dilatation or wall thickening. Moderate fecal stasis. Diverticulosis without diverticulitis. Vascular/Lymphatic: Heavy aortoiliac calcific plaques with visceral branch vessel atherosclerosis. No adenopathy is seen. Reproductive: Small calcified uterine fibroid. No other significant findings. Other: Mild generalized body wall edema similar. Trace presacral ascites is seen and mild mesenteric congestive features, also similar. There is no free hemorrhage or free air. Musculoskeletal: Since 10/01/2021, interval right hip nailing. There are healed pubic rami fractures. Osteopenia. Four lumbar type vertebrae with chronic wedging and kyphoplasty L1. Mild biconcave appearance of L2 and mild upper plate concavity at L3. Normal height of L5. All unchanged. No regional acute skeletal fracture is seen. IMPRESSION: 1. 3 mm acute small right parafalcine subdural bleed in the high frontal area. No mass effect or midline shift. 2. Osteopenia and degenerative change of the cervical spine without traumatic listhesis. 3. New T3 compression fracture with 30-40% loss of the vertebral height and mild fragmentation, with slight  outward spreading of fragments and mild retropulsion. No cord compromise but there is mild narrowing of the thecal sac. 4. New C7 mild compression fracture with loss of vertebral height of about 15% and mild anterior bulge of the anterior vertebral body. 5. No definitive canal hematoma but there is increased soft tissue fullness alongside T3 and T7. MRI follow-up recommended. Multilevel old kyphoplasty. 6. Small pleural effusions with increased atelectatic change versus consolidation in the left lower lobe. Diffuse bronchitis. 7. Cardiomegaly with calcific CAD, aortic and branch vessel atherosclerosis. 8. Increasingly dense liver. Clinical correlation advised for amiodarone or heavy metal toxicity versus iron deposition. 9. No acute trauma related findings in the abdomen or pelvis. 10. Chronically noted mesenteric congestion and mild body wall edema. Trace ascites. 11. Constipation and diverticulosis. 12. Interval right hip nailing since last year. 13. Critical Value/emergent results were called by telephone at the time of interpretation on 09/28/2022 at 11:49 pm to provider Dr. Madilyn Hook, who verbally acknowledged these results. Aortic Atherosclerosis (ICD10-I70.0). Electronically Signed: By: Almira Bar M.D. On: 09/29/2022 00:36   CT CERVICAL SPINE WO CONTRAST  Addendum Date: 09/29/2022   ADDENDUM REPORT: 09/29/2022 01:23 ADDENDUM: In retrospect the T7 compression fracture described above, does appear to have been present on thoracic spine plain films from 02/19/2022. There is increased sclerosis of the vertebral body compatible with an interval healing response, but the degree of compression and retropulsion seems unchanged. Electronically Signed   By: Almira Bar M.D.   On: 09/29/2022 01:23   Result Date: 09/29/2022 CLINICAL DATA:  Fall trauma with head trauma, additional blunt poly frequent falls. Trauma. EXAM: CT HEAD WITHOUT CONTRAST CT CERVICAL SPINE WITHOUT CONTRAST CT CHEST,  ABDOMEN AND PELVIS  WITHOUT CONTRAST TECHNIQUE: Contiguous axial images were obtained from the base of the skull through the vertex without intravenous contrast. Multidetector CT imaging of the cervical spine was performed without intravenous contrast. Multiplanar CT image reconstructions were also generated. Multidetector CT imaging of the chest, abdomen and pelvis was performed following the standard protocol without IV contrast. RADIATION DOSE REDUCTION: This exam was performed according to the departmental dose-optimization program which includes automated exposure control, adjustment of the mA and/or kV according to patient size and/or use of iterative reconstruction technique. COMPARISON:  Head and cervical spine CT both obtained recently 09/24/2022, CTA chest, abdomen and pelvis 10/01/2021, abdomen and pelvis CT 10/03/2020. FINDINGS: CT HEAD FINDINGS Brain: Newly noted is a small acute 3 mm thick right parafalcine subdural bleed in the high frontal area. There is no further evidence of intracranial hemorrhage. There is cerebral atrophy with mild atrophic ventriculomegaly, chronic inferior right frontal infarct, and extensive and fairly confluent hypoattenuating small-vessel disease of the cerebral white matter. There is a chronic right thalamic lacunar infarct and a small chronic infarct anteriorly in the left cerebellar hemisphere. No new cortical based infarct is seen, no mass effect or midline shift. Basal cisterns are clear. Vascular: There is calcification distal right vertebral artery and both siphons. No hyperdense central vessel is seen. Skull: There is osteopenia without evidence for depressed fractures. No scalp hematoma is seen. Sinuses/Orbits: Negative orbits. Clear paranasal sinuses with mild septal deviation to the right. Fluid again noted the bilateral mastoid tips, left-greater-than-right, with otherwise clear mastoids. Other: None. CT CERVICAL FINDINGS Alignment: Unchanged. No traumatic listhesis is suspected. A  slight degenerative listhesis is again noted at C4-5 and C7-T1. Bone-on-bone anterior atlantodental joint space loss and osteophytosis is again shown. There is no C1-2 offset. Skull base and vertebrae: Generalized osteopenia. There is a chronic mild upper plate anterior wedge compression fracture of the T1 vertebral body. This appears unchanged. At C7, new from 09/24/2022 there is a slight loss of vertebral height of about 15% and mild anterior bulge newly seen in the contour of the anterior vertebral body, findings consistent with a new mild compression fracture. At T3, new since 10/01/2021 there is a compression fracture with up to 30-40% generalized vertebral height loss and mild fragmentation, slight outward spreading of fragments and retropulsion, without cord compromise. Kyphoplasty cement and partial vertebral height loss are unchanged at T4. Soft tissues and spinal canal: There is edema and swelling around the new T3 compression fracture but no notable swelling at C7. There is no definitive spinal canal hematoma. Soft tissue resolution in the thoracic spine is less than in the cervical spine due to patient's overlying arms. There is moderate calcification in both proximal cervical ICAs. No laryngeal mass or thyroid nodules. Disc levels: Preservation of disc heights except for mild disc space loss at C5-6. There are mild facet joint and uncinate spurring changes at most levels, greater on the left, moderate left foraminal stenosis again noted C3-4, C4-5 and C6-7. No other significant foraminal stenosis. Other: None. CT CHEST FINDINGS Cardiovascular: Mild cardiomegaly. There are three-vessel coronary artery calcifications, aortic tortuosity, and moderate calcific plaques of the aorta and great vessels without aneurysm. Pulmonary arteries and veins appear normal in caliber. Mediastinum/Nodes: Calcified mediastinal and right hilar nodes. No adenopathy is seen without contrast. Moderate hiatal hernia is  unchanged. Lungs/Pleura: Small layering pleural effusions new from prior study. No pneumothorax. The main bronchi are clear. There are scarring changes in the apices and bases increased  atelectatic change in the left lower lobe versus consolidation. There is diffuse bronchial thickening. No nodules or further new findings. Musculoskeletal: Generalized osteopenia. There is a chronic fracture of the proximal right humerus. There are multiple bilateral chronic healed rib fracture deformities without acute displaced rib fracture. Rudimentary C7 cervical ribs.  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 Kyphoplasty cement is again noted in the T4 T8, T9, T10 and T12 vertebrae. In addition to the new compression fracture described above at T3 there is also a new moderate to severe compression fracture of the T7 vertebral body, with 75-80% loss of the vertebral height in general, with about 4 mm of posteroinferior cortical retropulsion. This mildly narrows the thecal sac eccentric to the left to 6.3 mm AP. Unchanged treated moderate wedging T8, T9, T10 and T12. No definitive evidence of a canal hematoma but there is increased soft tissue fullness alongside T3 and T7. CT ABDOMEN AND PELVIS FINDINGS Hepatobiliary: Increasingly dense liver measuring 20.6 cm length. Clinical correlation advised for amiodarone or heavy metal toxicity versus iron deposition. There are scattered calcified granulomas without visible mass. Gallbladder is absent with no biliary dilatation. Pancreas: No abnormality. Spleen: Costophrenic granulomas. Otherwise no further abnormality. No adjacent hematoma. Adrenals/Urinary Tract: No significant findings. No calculus. No bladder thickening or contour deforming mass of the kidneys. Stomach/Bowel: No dilatation or wall thickening. Moderate fecal stasis. Diverticulosis without diverticulitis. Vascular/Lymphatic: Heavy aortoiliac calcific plaques with visceral branch vessel atherosclerosis. No adenopathy is seen.  Reproductive: Small calcified uterine fibroid. No other significant findings. Other: Mild generalized body wall edema similar. Trace presacral ascites is seen and mild mesenteric congestive features, also similar. There is no free hemorrhage or free air. Musculoskeletal: Since 10/01/2021, interval right hip nailing. There are healed pubic rami fractures. Osteopenia. Four lumbar type vertebrae with chronic wedging and kyphoplasty L1. Mild biconcave appearance of L2 and mild upper plate concavity at L3. Normal height of L5. All unchanged. No regional acute skeletal fracture is seen. IMPRESSION: 1. 3 mm acute small right parafalcine subdural bleed in the high frontal area. No mass effect or midline shift. 2. Osteopenia and degenerative change of the cervical spine without traumatic listhesis. 3. New T3 compression fracture with 30-40% loss of the vertebral height and mild fragmentation, with slight outward spreading of fragments and mild retropulsion. No cord compromise but there is mild narrowing of the thecal sac. 4. New C7 mild compression fracture with loss of vertebral height of about 15% and mild anterior bulge of the anterior vertebral body. 5. No definitive canal hematoma but there is increased soft tissue fullness alongside T3 and T7. MRI follow-up recommended. Multilevel old kyphoplasty. 6. Small pleural effusions with increased atelectatic change versus consolidation in the left lower lobe. Diffuse bronchitis. 7. Cardiomegaly with calcific CAD, aortic and branch vessel atherosclerosis. 8. Increasingly dense liver. Clinical correlation advised for amiodarone or heavy metal toxicity versus iron deposition. 9. No acute trauma related findings in the abdomen or pelvis. 10. Chronically noted mesenteric congestion and mild body wall edema. Trace ascites. 11. Constipation and diverticulosis. 12. Interval right hip nailing since last year. 13. Critical Value/emergent results were called by telephone at the time of  interpretation on 09/28/2022 at 11:49 pm to provider Dr. Madilyn Hook, who verbally acknowledged these results. Aortic Atherosclerosis (ICD10-I70.0). Electronically Signed: By: Almira Bar M.D. On: 09/29/2022 00:36   CT CHEST ABDOMEN PELVIS WO CONTRAST  Addendum Date: 09/29/2022   ADDENDUM REPORT: 09/29/2022 01:23 ADDENDUM: In retrospect the T7 compression fracture described above, does  appear to have been present on thoracic spine plain films from 02/19/2022. There is increased sclerosis of the vertebral body compatible with an interval healing response, but the degree of compression and retropulsion seems unchanged. Electronically Signed   By: Almira Bar M.D.   On: 09/29/2022 01:23   Result Date: 09/29/2022 CLINICAL DATA:  Fall trauma with head trauma, additional blunt poly frequent falls. Trauma. EXAM: CT HEAD WITHOUT CONTRAST CT CERVICAL SPINE WITHOUT CONTRAST CT CHEST, ABDOMEN AND PELVIS WITHOUT CONTRAST TECHNIQUE: Contiguous axial images were obtained from the base of the skull through the vertex without intravenous contrast. Multidetector CT imaging of the cervical spine was performed without intravenous contrast. Multiplanar CT image reconstructions were also generated. Multidetector CT imaging of the chest, abdomen and pelvis was performed following the standard protocol without IV contrast. RADIATION DOSE REDUCTION: This exam was performed according to the departmental dose-optimization program which includes automated exposure control, adjustment of the mA and/or kV according to patient size and/or use of iterative reconstruction technique. COMPARISON:  Head and cervical spine CT both obtained recently 09/24/2022, CTA chest, abdomen and pelvis 10/01/2021, abdomen and pelvis CT 10/03/2020. FINDINGS: CT HEAD FINDINGS Brain: Newly noted is a small acute 3 mm thick right parafalcine subdural bleed in the high frontal area. There is no further evidence of intracranial hemorrhage. There is cerebral atrophy  with mild atrophic ventriculomegaly, chronic inferior right frontal infarct, and extensive and fairly confluent hypoattenuating small-vessel disease of the cerebral white matter. There is a chronic right thalamic lacunar infarct and a small chronic infarct anteriorly in the left cerebellar hemisphere. No new cortical based infarct is seen, no mass effect or midline shift. Basal cisterns are clear. Vascular: There is calcification distal right vertebral artery and both siphons. No hyperdense central vessel is seen. Skull: There is osteopenia without evidence for depressed fractures. No scalp hematoma is seen. Sinuses/Orbits: Negative orbits. Clear paranasal sinuses with mild septal deviation to the right. Fluid again noted the bilateral mastoid tips, left-greater-than-right, with otherwise clear mastoids. Other: None. CT CERVICAL FINDINGS Alignment: Unchanged. No traumatic listhesis is suspected. A slight degenerative listhesis is again noted at C4-5 and C7-T1. Bone-on-bone anterior atlantodental joint space loss and osteophytosis is again shown. There is no C1-2 offset. Skull base and vertebrae: Generalized osteopenia. There is a chronic mild upper plate anterior wedge compression fracture of the T1 vertebral body. This appears unchanged. At C7, new from 09/24/2022 there is a slight loss of vertebral height of about 15% and mild anterior bulge newly seen in the contour of the anterior vertebral body, findings consistent with a new mild compression fracture. At T3, new since 10/01/2021 there is a compression fracture with up to 30-40% generalized vertebral height loss and mild fragmentation, slight outward spreading of fragments and retropulsion, without cord compromise. Kyphoplasty cement and partial vertebral height loss are unchanged at T4. Soft tissues and spinal canal: There is edema and swelling around the new T3 compression fracture but no notable swelling at C7. There is no definitive spinal canal hematoma.  Soft tissue resolution in the thoracic spine is less than in the cervical spine due to patient's overlying arms. There is moderate calcification in both proximal cervical ICAs. No laryngeal mass or thyroid nodules. Disc levels: Preservation of disc heights except for mild disc space loss at C5-6. There are mild facet joint and uncinate spurring changes at most levels, greater on the left, moderate left foraminal stenosis again noted C3-4, C4-5 and C6-7. No other significant foraminal stenosis. Other:  None. CT CHEST FINDINGS Cardiovascular: Mild cardiomegaly. There are three-vessel coronary artery calcifications, aortic tortuosity, and moderate calcific plaques of the aorta and great vessels without aneurysm. Pulmonary arteries and veins appear normal in caliber. Mediastinum/Nodes: Calcified mediastinal and right hilar nodes. No adenopathy is seen without contrast. Moderate hiatal hernia is unchanged. Lungs/Pleura: Small layering pleural effusions new from prior study. No pneumothorax. The main bronchi are clear. There are scarring changes in the apices and bases increased atelectatic change in the left lower lobe versus consolidation. There is diffuse bronchial thickening. No nodules or further new findings. Musculoskeletal: Generalized osteopenia. There is a chronic fracture of the proximal right humerus. There are multiple bilateral chronic healed rib fracture deformities without acute displaced rib fracture. Rudimentary C7 cervical ribs.  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 Kyphoplasty cement is again noted in the T4 T8, T9, T10 and T12 vertebrae. In addition to the new compression fracture described above at T3 there is also a new moderate to severe compression fracture of the T7 vertebral body, with 75-80% loss of the vertebral height in general, with about 4 mm of posteroinferior cortical retropulsion. This mildly narrows the thecal sac eccentric to the left to 6.3 mm AP. Unchanged treated moderate wedging  T8, T9, T10 and T12. No definitive evidence of a canal hematoma but there is increased soft tissue fullness alongside T3 and T7. CT ABDOMEN AND PELVIS FINDINGS Hepatobiliary: Increasingly dense liver measuring 20.6 cm length. Clinical correlation advised for amiodarone or heavy metal toxicity versus iron deposition. There are scattered calcified granulomas without visible mass. Gallbladder is absent with no biliary dilatation. Pancreas: No abnormality. Spleen: Costophrenic granulomas. Otherwise no further abnormality. No adjacent hematoma. Adrenals/Urinary Tract: No significant findings. No calculus. No bladder thickening or contour deforming mass of the kidneys. Stomach/Bowel: No dilatation or wall thickening. Moderate fecal stasis. Diverticulosis without diverticulitis. Vascular/Lymphatic: Heavy aortoiliac calcific plaques with visceral branch vessel atherosclerosis. No adenopathy is seen. Reproductive: Small calcified uterine fibroid. No other significant findings. Other: Mild generalized body wall edema similar. Trace presacral ascites is seen and mild mesenteric congestive features, also similar. There is no free hemorrhage or free air. Musculoskeletal: Since 10/01/2021, interval right hip nailing. There are healed pubic rami fractures. Osteopenia. Four lumbar type vertebrae with chronic wedging and kyphoplasty L1. Mild biconcave appearance of L2 and mild upper plate concavity at L3. Normal height of L5. All unchanged. No regional acute skeletal fracture is seen. IMPRESSION: 1. 3 mm acute small right parafalcine subdural bleed in the high frontal area. No mass effect or midline shift. 2. Osteopenia and degenerative change of the cervical spine without traumatic listhesis. 3. New T3 compression fracture with 30-40% loss of the vertebral height and mild fragmentation, with slight outward spreading of fragments and mild retropulsion. No cord compromise but there is mild narrowing of the thecal sac. 4. New C7 mild  compression fracture with loss of vertebral height of about 15% and mild anterior bulge of the anterior vertebral body. 5. No definitive canal hematoma but there is increased soft tissue fullness alongside T3 and T7. MRI follow-up recommended. Multilevel old kyphoplasty. 6. Small pleural effusions with increased atelectatic change versus consolidation in the left lower lobe. Diffuse bronchitis. 7. Cardiomegaly with calcific CAD, aortic and branch vessel atherosclerosis. 8. Increasingly dense liver. Clinical correlation advised for amiodarone or heavy metal toxicity versus iron deposition. 9. No acute trauma related findings in the abdomen or pelvis. 10. Chronically noted mesenteric congestion and mild body wall edema. Trace ascites. 11.  Constipation and diverticulosis. 12. Interval right hip nailing since last year. 13. Critical Value/emergent results were called by telephone at the time of interpretation on 09/28/2022 at 11:49 pm to provider Dr. Madilyn Hook, who verbally acknowledged these results. Aortic Atherosclerosis (ICD10-I70.0). Electronically Signed: By: Almira Bar M.D. On: 09/29/2022 00:36   CT L-SPINE NO CHARGE  Result Date: 09/29/2022 CLINICAL DATA:  Back trauma.  Level 2 fall on blood thinners. EXAM: CT THORACIC AND LUMBAR SPINE WITHOUT CONTRAST TECHNIQUE: Multidetector CT imaging of the thoracic and lumbar spine was performed without contrast. Multiplanar CT image reconstructions were also generated. RADIATION DOSE REDUCTION: This exam was performed according to the departmental dose-optimization program which includes automated exposure control, adjustment of the mA and/or kV according to patient size and/or use of iterative reconstruction technique. COMPARISON:  Thoracic spine radiographs 02/19/2022 and CT thoracic and lumbar spine 10/01/2021 FINDINGS: CT THORACIC SPINE FINDINGS Alignment: No evidence of traumatic malalignment. Accentuated thoracic kyphosis centered at T7-T8. Vertebrae: Status post  vertebroplasty at T4, T8, T9, and T12. There is new superior endplate compression of T3 when compared with 02/19/2022 with mildly displaced right anterior superior corner fracture (series 3/image 57). Unchanged superior endplate compression of T1 where it is mild, T7 where it is advanced with unchanged retropulsion of the posterior endplate. Extrusion vertebroplasty material through the superior endplate of T12 with indentation of the inferior endplate of T11, unchanged from prior radiograph. Paraspinal and other soft tissues: Unremarkable. See separate report for findings in the chest, abdomen, and pelvis. Disc levels: Multilevel spondylosis, disc space height loss, and degenerative endplate changes. Osteopenia. No severe spinal canal narrowing. Retropulsion of the posterior vertebral body walls at T3, T7, T8, T9, T10, and T12 cause up to mild effacement of the ventral thecal sac. CT LUMBAR SPINE FINDINGS Segmentation: Transitional anatomy. Absent ribs at T12. Fully sacralized L5. Careful attention to numbering is recommended prior to any intervention. Alignment: No evidence of traumatic malalignment. Vertebrae: L1 vertebroplasty. Superior endplate compression fractures are unchanged in L2 and L3. No evidence of acute fracture. Paraspinal and other soft tissues: Negative. See separate report for findings in the abdomen and pelvis. Disc levels: Multilevel spondylosis, disc space height loss, degenerative endplate changes with vacuum phenomenon. Multilevel moderate-advanced facet arthropathy. Inferior endplate retropulsion at L1 causes mild effacement of the ventral thecal sac. No high-grade spinal canal. IMPRESSION: CT THORACIC SPINE IMPRESSION. 1. Age indeterminate superior endplate compression fracture of T3 with 40% vertebral body height loss and mildly displaced right anterior superior corner fracture. This is new since thoracic spine radiographs 02/19/2022 and may be acute. CT LUMBAR SPINE IMPRESSION. 1. No  acute fracture in the lumbar spine. Unchanged superior endplate compression fractures at L2 and L3. 2. Transitional anatomy with complete sacralized L5. Careful attention to numbering is recommended prior to any intervention. Same day CT chest, abdomen, pelvis reported separately. Electronically Signed   By: Minerva Fester M.D.   On: 09/29/2022 00:05   CT T-SPINE NO CHARGE  Result Date: 09/29/2022 CLINICAL DATA:  Back trauma.  Level 2 fall on blood thinners. EXAM: CT THORACIC AND LUMBAR SPINE WITHOUT CONTRAST TECHNIQUE: Multidetector CT imaging of the thoracic and lumbar spine was performed without contrast. Multiplanar CT image reconstructions were also generated. RADIATION DOSE REDUCTION: This exam was performed according to the departmental dose-optimization program which includes automated exposure control, adjustment of the mA and/or kV according to patient size and/or use of iterative reconstruction technique. COMPARISON:  Thoracic spine radiographs 02/19/2022 and CT thoracic and lumbar spine  10/01/2021 FINDINGS: CT THORACIC SPINE FINDINGS Alignment: No evidence of traumatic malalignment. Accentuated thoracic kyphosis centered at T7-T8. Vertebrae: Status post vertebroplasty at T4, T8, T9, and T12. There is new superior endplate compression of T3 when compared with 02/19/2022 with mildly displaced right anterior superior corner fracture (series 3/image 57). Unchanged superior endplate compression of T1 where it is mild, T7 where it is advanced with unchanged retropulsion of the posterior endplate. Extrusion vertebroplasty material through the superior endplate of T12 with indentation of the inferior endplate of T11, unchanged from prior radiograph. Paraspinal and other soft tissues: Unremarkable. See separate report for findings in the chest, abdomen, and pelvis. Disc levels: Multilevel spondylosis, disc space height loss, and degenerative endplate changes. Osteopenia. No severe spinal canal narrowing.  Retropulsion of the posterior vertebral body walls at T3, T7, T8, T9, T10, and T12 cause up to mild effacement of the ventral thecal sac. CT LUMBAR SPINE FINDINGS Segmentation: Transitional anatomy. Absent ribs at T12. Fully sacralized L5. Careful attention to numbering is recommended prior to any intervention. Alignment: No evidence of traumatic malalignment. Vertebrae: L1 vertebroplasty. Superior endplate compression fractures are unchanged in L2 and L3. No evidence of acute fracture. Paraspinal and other soft tissues: Negative. See separate report for findings in the abdomen and pelvis. Disc levels: Multilevel spondylosis, disc space height loss, degenerative endplate changes with vacuum phenomenon. Multilevel moderate-advanced facet arthropathy. Inferior endplate retropulsion at L1 causes mild effacement of the ventral thecal sac. No high-grade spinal canal. IMPRESSION: CT THORACIC SPINE IMPRESSION. 1. Age indeterminate superior endplate compression fracture of T3 with 40% vertebral body height loss and mildly displaced right anterior superior corner fracture. This is new since thoracic spine radiographs 02/19/2022 and may be acute. CT LUMBAR SPINE IMPRESSION. 1. No acute fracture in the lumbar spine. Unchanged superior endplate compression fractures at L2 and L3. 2. Transitional anatomy with complete sacralized L5. Careful attention to numbering is recommended prior to any intervention. Same day CT chest, abdomen, pelvis reported separately. Electronically Signed   By: Minerva Fester M.D.   On: 09/29/2022 00:05   DG Pelvis Portable  Result Date: 09/28/2022 CLINICAL DATA:  Trauma fall EXAM: PORTABLE PELVIS 1-2 VIEWS COMPARISON:  05/02/2021, 07/23/2022 FINDINGS: SI joints are non widened. Pubic symphysis appears intact. Old appearing left inferior pubic ramus fracture. Remote left femoral orthopedic hardware. Interval intramedullary rodding of comminuted right intertrochanteric fracture. No definite acute  osseous abnormality. IMPRESSION: 1. No acute osseous abnormality. 2. Interval intramedullary rodding of comminuted right intertrochanteric fracture. Electronically Signed   By: Jasmine Pang M.D.   On: 09/28/2022 23:19   DG Chest Port 1 View  Result Date: 09/28/2022 CLINICAL DATA:  Trauma fall EXAM: PORTABLE CHEST 1 VIEW COMPARISON:  07/23/2022 FINDINGS: Mild diffuse interstitial opacity likely chronic disease. Negative for pneumothorax. Healing right proximal humerus fracture with callus. Multiple treated vertebral compression deformities. Multiple remote right rib fractures. Hiatal hernia contributing to retrocardiac opacity. IMPRESSION: 1. Negative for pneumothorax or pleural effusion. 2. Healing right proximal humerus fracture with callus. 3. Hiatal hernia. Electronically Signed   By: Jasmine Pang M.D.   On: 09/28/2022 23:18    EKG: I independently viewed the EKG done and my findings are as followed: None at the time of this visit.  Assessment/Plan Present on Admission:  Subdural hematoma (HCC)  Principal Problem:   Subdural hematoma (HCC)  Subdural hematoma, post fall, POA EDP discussed the case with neurosurgery PA Alli Cosentino Repeat noncontrast CT head after 6 hours Monitor on telemetry Hold off  home Eliquis due to intracranial bleed Will need clearance from neurosurgery prior to restarting home Eliquis. PT OT evaluation Fall precautions  Acute hypoxic respiratory failure Currently requiring 4 to 6 L to maintain a saturation greater than 90% Wean off oxygen supplementation as tolerated. No pneumothorax or pleural effusion seen on imaging Incentive spirometer DuoNeb as needed for wheezing  Compression fractures as stated above IR consulted to assess for possible kyphoplasty Pain control in place as well as PRN bowel regimen  Leukocytosis UA is pending Chest x-ray was nonacute  Hypokalemia Serum potassium 2.8 Repleted intravenously with KCl 10 mill equivalent x  4 Repeat BMP in the morning and check magnesium level.  Paroxysmal A-fib on Eliquis Home Eliquis held due to the above Resume home amiodarone Monitor on telemetry  GERD Resume home PPI  Hyperlipidemia Resume home Crestor  Chronic anxiety/depression Resume home sertraline.       DVT prophylaxis: SCDs.  Code Status: Full code.  Family Communication: None at bedside.  Disposition Plan: Admitted to telemetry medical unit  Consults called: Neurosurgery by EDP.  Admission status: Inpatient status.   Status is: Inpatient The patient requires at least 2 midnights for further evaluation and treatment of present condition.   Darlin Drop MD [facility] Pager [phone]  If 7PM-7AM, please contact night-coverage [email or web address] Password [email or web address]  09/29/2022, 4:00 AM

## 2022-09-29 NOTE — Progress Notes (Signed)
PT Cancellation Note  Patient Details Name: Dawn YSAGUIRRE MRN: [id number] DOB: 01-22-1930   Cancelled Treatment:    Reason Eval/Treat Not Completed: Other (comment).  Got up to [facility] and refusing to move now, "lets wait and not do this this week".  Re-attempt as pt will allow.   Ivar Drape 09/29/2022, 1:09 PM  Samul Dada, PT PhD Acute Rehab Dept. Number: [facility] [id number] and [facility] [phone]

## 2022-09-29 NOTE — Progress Notes (Signed)
Request received for multi level KP.   Image will be reviewed by Dr. Corliss Skains on Monday to see if patient is a candidate, patient may need MRI for further eval.  Eliqus and Plavix listed as home meds, unknown last does, currently being held due to SDH.   Formal consult to follow. Please call IR for questions and concerns.   Lynann Bologna Judye Lorino PA-C 09/29/2022 11:50 AM

## 2022-09-29 NOTE — Consult Note (Signed)
Neurosurgery Consultation  Reason for Consult: SDH and compression fractures Referring Physician: Margo Aye  CC: upper back / neck pain  HPI: This is a 89 y.o. female with medical history significant for hypertension, polycythemia, chronic anxiety/depression, GERD, paroxysmal A-fib on Eliquis, history of PE/DVT on plavix, history of stroke, who presented to [facility] ED from [facility] due to an unwitnessed fall. No new weakness, numbness, or parasthesias, no recent change in bowel or bladder function.   ROS: A 14 point ROS was performed and is negative except as noted in the HPI.   PMHx:  Past Medical History:  Diagnosis Date   CAD (coronary artery disease)    mild nonobstructive disease on cath in 2003   Cancer [facility])    Cardiomyopathy    Probable Takotsubo, severe CP w/ normal cath in 1994. Severe CP in 2003 w/ widespread T wave inversions on ECG. Cath w/ minimal coronary disease but LV-gram showed periapical severe hypokinesis and basilar hyperkinesia (EF 40%). Last echo in 4/09 confirmed full LV functional recovery with EF 60%, no regional wall motion abnormalities, mild to moderate LVH.   Chronic diastolic CHF (congestive heart failure) (HCC) 09/13/2019   CVA (cerebral infarction)    Depression with anxiety 03/29/2011   lost husband 3'09   DVT (deep venous thrombosis) (HCC)    after venous ablation, R leg   Fracture 09/10/2007   L2, status post vertebroplasty of L2 performed by IR   GERD (gastroesophageal reflux disease)    Hemorrhoids    Hiatal hernia 03/29/2011   no nerve problems   Hyperlipidemia    Hyperplastic colon polyp 06/2001   Hypertension    Iron deficiency anemia due to chronic blood loss 12/30/2017   OA (osteoarthritis) of knee 03/29/2011   w/ bilateral knee pain-not a problem now   OSA (obstructive sleep apnea) 03/29/2011   no cpap used, not a problem now.   Osteoporosis    Otalgia of both ears    Dr. Emeline Darling   Polycythemia    Stroke [facility]) 03/29/2011   CVA x2 -last  10'12-?TIA(visual problems)   Varicose vein    FamHx:  Family History  Problem Relation Age of Onset   Breast cancer Mother    Heart disease Father        MIs   Colon cancer Other        cousin   Breast cancer Sister    Leukemia Brother        GM   SocHx:  reports that she has been smoking cigarettes. She started smoking about 71 years ago. She has a 37.00 pack-year smoking history. She has never used smokeless tobacco. She reports that she does not currently use alcohol after a past usage of about 1.0 standard drink of alcohol per week. She reports that she does not use drugs.  Exam: Vital signs in last 24 hours: Temp:  [97.7 F (36.5 C)-98.4 F (36.9 C)] 98.1 F (36.7 C) (05/18 0826) Pulse Rate:  [57-63] 61 (05/18 0826) Resp:  [15-23] 15 (05/18 0826) BP: (101-148)/(56-80) 143/61 (05/18 0826) SpO2:  [89 %-96 %] 94 % (05/18 0826) General: Awake, alert, cooperative, lying in bed in NAD Head: Normocephalic and atruamatic HEENT: Neck supple Pulmonary: breathing room air comfortably, no evidence of increased work of breathing Cardiac: RRR Abdomen: S NT ND Extremities: Warm and well perfused x4 Neuro: AOx3, PERRL, EOMI, FS Strength 5/5 x4, SILTx4, hard collar in place    Assessment and Plan: 89 y.o. female with small right frontal parafalcine  SDH and C7, T3 and T7 compression fractures after an unwitnessed fall when transferring from chair to wheelchair yesterday. CTH and CT C / T / L spines personally reviewed, which shows acute 3mm right frontal parafalcine SDH without mass effect or midline shift, acute C7 T3 and T7 compression fractures, multiple kyphoplasties throughout the spine that are stable. Repeat CTH is stable with trace right frontal parafalcine SDH.  -no acute neurosurgical intervention indicated at this time -continue hard collar, may remove for bathing  -restart Eliquis and Plavix in 1 week  -please call with any concerns or questions  Iran Sizer,  PA-C 09/29/22 9:06 AM [facility]

## 2022-09-29 NOTE — ED Notes (Signed)
ED TO INPATIENT HANDOFF REPORT  ED Nurse Name and Phone #: Juliette Alcide RN [phone]  S Name/Age/Gender [name] 89 y.o. female Room/Bed: 028C/028C  Code Status   Code Status: Full Code  Home/SNF/Other Skilled nursing facility Patient oriented to: self, place, time, and situation Is this baseline? Yes   Triage Complete: Triage complete  Chief Complaint Subdural hematoma (HCC) [S06.5XAA]  Triage Note Pt BIB [facility] from [facility] with c/o unwitness fall. Pt reports she was trying to get from recliner to wheelchair and fell on her back. Pt is c/o R shoulder pain. EMS reports that on arrival pt had O2 saturation of 68% on room air. Pt was put on 4L O2 via [place] by EMS with improvement. Pt not on O2 at baseline. C-collar in place.   Allergies Allergies  Allergen Reactions   Penicillins Hives, Rash and Other (See Comments)    Has patient had a PCN reaction causing immediate rash, facial/tongue/throat swelling, SOB or lightheadedness with hypotension: Yes Has patient had a PCN reaction causing severe rash involving mucus membranes or skin necrosis: yes Has patient had a PCN reaction that required hospitalization: No Has patient had a PCN reaction occurring within the last 10 years: no If all of the above answers are "NO", then may proceed with Cephalosporin use.  Other Reaction: OTHER REACTION   Valsartan Itching and Other (See Comments)   Atorvastatin Diarrhea   Carvedilol Other (See Comments)    "teeth hurt when I took it"    Ezetimibe Nausea Only   Fluvastatin Sodium Other (See Comments)    "Can't remember"   Magnesium Hydroxide Other (See Comments)    REACTION: triggers HAs   Meloxicam Other (See Comments)    Pt seeing auro's / spots.     Pneumovax [Pneumococcal Polysaccharide Vaccine] Other (See Comments)    Local reaction   Quinapril Hcl Other (See Comments)     "feelings of tiredness"   Simvastatin Diarrhea   Topamax [Topiramate] Itching   Vit D-Vit  E-Safflower Oil Other (See Comments)    Headaches    Level of Care/Admitting Diagnosis ED Disposition     ED Disposition  Admit   Condition  --   Comment  Hospital Area: [facility] [[id number]]  Level of Care: Telemetry Medical [104]  May admit patient to [facility] or [facility] if equivalent level of care is available:: No  Covid Evaluation: Asymptomatic - no recent exposure (last 10 days) testing not required  Diagnosis: Subdural hematoma [facility]) [[id number]]  Admitting Physician: Darlin Drop [[id number]]  Attending Physician: Darlin Drop [[id number]]  Certification:: I certify this patient will need inpatient services for at least 2 midnights  Estimated Length of Stay: 2          B Medical/Surgery History Past Medical History:  Diagnosis Date   CAD (coronary artery disease)    mild nonobstructive disease on cath in 2003   Cancer [facility])    Cardiomyopathy    Probable Takotsubo, severe CP w/ normal cath in 1994. Severe CP in 2003 w/ widespread T wave inversions on ECG. Cath w/ minimal coronary disease but LV-gram showed periapical severe hypokinesis and basilar hyperkinesia (EF 40%). Last echo in 4/09 confirmed full LV functional recovery with EF 60%, no regional wall motion abnormalities, mild to moderate LVH.   Chronic diastolic CHF (congestive heart failure) (HCC) 09/13/2019   CVA (cerebral infarction)    Depression with anxiety 03/29/2011   lost husband 3'09   DVT (  deep venous thrombosis) (HCC)    after venous ablation, R leg   Fracture 09/10/2007   L2, status post vertebroplasty of L2 performed by IR   GERD (gastroesophageal reflux disease)    Hemorrhoids    Hiatal hernia 03/29/2011   no nerve problems   Hyperlipidemia    Hyperplastic colon polyp 06/2001   Hypertension    Iron deficiency anemia due to chronic blood loss 12/30/2017   OA (osteoarthritis) of knee 03/29/2011   w/ bilateral knee pain-not a problem now   OSA (obstructive sleep apnea)  03/29/2011   no cpap used, not a problem now.   Osteoporosis    Otalgia of both ears    Dr. Emeline Darling   Polycythemia    Stroke [facility]) 03/29/2011   CVA x2 -last 10'12-?TIA(visual problems)   Varicose vein    Past Surgical History:  Procedure Laterality Date   APPENDECTOMY     BACK SURGERY     BREAST BIOPSY     BREAST CYST EXCISION     BREAST LUMPECTOMY Left 2013   left stage I left breast cancer   CHOLECYSTECTOMY  04/09/2011   Procedure: LAPAROSCOPIC CHOLECYSTECTOMY WITH INTRAOPERATIVE CHOLANGIOGRAM;  Surgeon: Adolph Pollack, MD;  Location: [facility] ORS;  Service: General;  Laterality: N/A;   CORONARY ATHERECTOMY N/A 02/26/2022   Procedure: CORONARY ATHERECTOMY;  Surgeon: Yvonne Kendall, MD;  Location: [facility] INVASIVE CV LAB;  Service: Cardiovascular;  Laterality: N/A;   CORONARY LITHOTRIPSY N/A 02/26/2022   Procedure: CORONARY LITHOTRIPSY;  Surgeon: Yvonne Kendall, MD;  Location: [facility] INVASIVE CV LAB;  Service: Cardiovascular;  Laterality: N/A;   CORONARY STENT INTERVENTION N/A 02/26/2022   Procedure: CORONARY STENT INTERVENTION;  Surgeon: Yvonne Kendall, MD;  [facility]: [facility] INVAS[facility] CV LAB;  Service: Cardiovascular;  Laterality: N/A;   CORONARY ULTRASOUND/IVUS N/A 02/26/2022   Procedure: Intravascular Ultrasound/IVUS;  Surgeon: Yvonne Kendall, MD;  [facility]: [facility] INV[facility] CV LAB;  Service: Cardiovascular;  Laterality: N/A;   Dental Extraction     L maxillary molar   FEMUR IM NAIL Right 07/25/2022   Procedure: INTRAMEDULLARY (IM) NAIL FEMORAL;  Surgeon: Yolonda Kida, MD;  Location: [facility] OR;  Service: Orthopedics;  Laterality: Right;  hana table,carm.synthesis   INTRAMEDULLARY (IM) NAIL INTERTROCHANTERIC Left 12/25/2019   Procedure: INTRAMEDULLARY (IM) NAIL INTERTROCHANTRIC;  Surgeon: Durene Romans, MD;  Location: [facility]S;  Service: Orthopedics;  Laterality: Left;   IR KYPHO THORACIC WITH BONE BIOPSY  09/15/2019   IR KYPHO THORACIC WITH BONE BIOPSY  07/13/2020       IR KYPHO THORACIC WITH  BONE BIOPSY  07/13/2020   IR VERTEBROPLASTY CERV/THOR BX INC UNI/BIL INC/INJECT/IMAGING  11/10/2019   KYPHOPLASTY N/A 11/16/2020   Procedure: T11,  L1,KYPHOPLASTY;  Surgeon: Venita Lick, MD;  [facility];  Service: Orthopedics;  Laterality: N/A;   KYPHOSIS SURGERY  08/2007   cement used   LEFT HEART CATH AND CORONARY ANGIOGRAPHY N/A 02/23/2022   Procedure: LEFT HEART CATH AND CORONARY ANGIOGRAPHY;  Surgeon: Swaziland, Peter M, MD;  Location: [facility] INVASIVE CV LAB;  Service: Cardiovascular;  Laterality: N/A;   LEFT HEART CATH AND CORONARY ANGIOGRAPHY N/A 02/26/2022   Procedure: LEFT HEART CATH AND CORONARY ANGIOGRAPHY;  Surgeon: Yvonne Kendall, MD;  [facility];  Service: Cardiovascular;  Laterality: N/A;   OVARIAN CYST SURGERY     left   RADIOLOGY WITH ANESTHESIA N/A 11/10/2019   Procedure: VERTEBROPLASTY;  Surgeon: Julieanne Cotton, MD;  Location: [facility] OR;  Service: Radiology;  Laterality: N/A;   TEMPORARY  PACEMAKER N/A 02/26/2022   Procedure: TEMPORARY PACEMAKER;  Surgeon: Yvonne Kendall, MD;  [facility];  Service: Cardiovascular;  Laterality: N/A;   TONSILLECTOMY     TOTAL KNEE ARTHROPLASTY  06/2010   left   TUBAL LIGATION       A IV Location/Drains/Wounds Patient Lines/Drains/Airways Status     Active Line/Drains/Airways     Name Placement date Placement time Site Days   Peripheral IV 09/28/22 20 G 1" Left Antecubital 09/28/22  2308  Antecubital  1            Intake/Output Last 24 hours No intake or output data in the 24 hours ending 09/29/22 0504  Labs/Imaging Results for orders placed or performed during the hospital encounter of 09/28/22 (from the past 48 hour(s))  Sample to Blood Bank     Status: None   Collection Time: 09/28/22 10:43 PM  Result Value Ref Range   Blood Bank Specimen SAMPLE AVAILABLE FOR TESTING    Sample Expiration      10/01/2022,2359 Performed at [facility], [address] [place], [place] [address]    Comprehensive metabolic panel     Status: Abnormal   Collection Time: 09/28/22 10:56 PM  Result Value Ref Range   Sodium 132 (L) 135 - 145 mmol/L   Potassium 3.0 (L) 3.5 - 5.1 mmol/L   Chloride 94 (L) 98 - 111 mmol/L   CO2 27 22 - 32 mmol/L   Glucose, Bld 128 (H) 70 - 99 mg/dL    Comment: Glucose reference range applies only to samples taken after fasting for at least 8 hours.   BUN 9 8 - 23 mg/dL   Creatinine, Ser 3.08 0.44 - 1.00 mg/dL   Calcium 8.6 (L) 8.9 - 10.3 mg/dL   Total Protein 6.6 6.5 - 8.1 g/dL   Albumin 3.4 (L) 3.5 - 5.0 g/dL   AST 24 15 - 41 U/L   ALT 19 0 - 44 U/L   Alkaline Phosphatase 78 38 - 126 U/L   Total Bilirubin 1.2 0.3 - 1.2 mg/dL   GFR, Estimated >65 >78 mL/min    Comment: (NOTE) Calculated using the CKD-EPI Creatinine Equation (2021)    Anion gap 11 5 - 15    Comment: Performed at [facility], [address], [place], [place] [address]  CBC     Status: Abnormal   Collection Time: 09/28/22 10:56 PM  Result Value Ref Range   WBC 13.6 (H) 4.0 - 10.5 K/uL   RBC 5.08 3.87 - 5.11 MIL/uL   Hemoglobin 12.7 12.0 - 15.0 g/dL   HCT 95.2 84.1 - 32.4 %   MCV 82.7 80.0 - 100.0 fL   MCH 25.0 (L) 26.0 - 34.0 pg   MCHC 30.2 30.0 - 36.0 g/dL   RDW 40.1 (H) 02.7 - 25.3 %   Platelets 428 (H) 150 - 400 K/uL   nRBC 0.0 0.0 - 0.2 %    Comment: Performed at [facility], [address], [place], [place] [address]  Ethanol     Status: None   Collection Time: 09/28/22 10:56 PM  Result Value Ref Range   Alcohol, Ethyl (B) <10 <10 mg/dL    Comment: (NOTE) Lowest detectable limit for serum alcohol is 10 mg/dL.  For medical purposes only. Performed at [facility], [address], [place], [place] [address]   Lactic acid, plasma     Status: None   Collection Time: 09/28/22  10:56 PM  Result Value Ref Range   Lactic Acid, Venous 1.0 0.5 - 1.9 mmol/L    Comment: Performed at [facility], [address], [place], [place] [address]  Protime-INR      Status: Abnormal   Collection Time: 09/28/22 10:56 PM  Result Value Ref Range   Prothrombin Time 17.0 (H) 11.4 - 15.2 seconds   INR 1.4 (H) 0.8 - 1.2    Comment: (NOTE) INR goal varies based on device and disease states. Performed at [facility], [address], [place], [place] [address]   I-Stat Chem 8, ED     Status: Abnormal   Collection Time: 09/28/22 10:57 PM  Result Value Ref Range   Sodium 134 (L) 135 - 145 mmol/L   Potassium 3.2 (L) 3.5 - 5.1 mmol/L   Chloride 94 (L) 98 - 111 mmol/L   BUN 12 8 - 23 mg/dL   Creatinine, Ser 8.29 0.44 - 1.00 mg/dL   Glucose, Bld 562 (H) 70 - 99 mg/dL    Comment: Glucose reference range applies only to samples taken after fasting for at least 8 hours.   Calcium, Ion 1.06 (L) 1.15 - 1.40 mmol/L   TCO2 32 22 - 32 mmol/L   Hemoglobin 15.3 (H) 12.0 - 15.0 g/dL   HCT 13.0 86.5 - 78.4 %  POC occult blood, ED Provider will collect     Status: None   Collection Time: 09/29/22  1:15 AM  Result Value Ref Range   Fecal Occult Bld NEGATIVE NEGATIVE  I-Stat arterial blood gas, ED ([facility] ED, [facility], [facility])     Status: Abnormal   Collection Time: 09/29/22  2:15 AM  Result Value Ref Range   pH, Arterial 7.386 7.35 - 7.45   pCO2 arterial 49.5 (H) 32 - 48 mmHg   pO2, Arterial 54 (L) 83 - 108 mmHg   Bicarbonate 29.9 (H) 20.0 - 28.0 mmol/L   TCO2 31 22 - 32 mmol/L   O2 Saturation 87 %   Acid-Base Excess 4.0 (H) 0.0 - 2.0 mmol/L   Sodium 134 (L) 135 - 145 mmol/L   Potassium 2.8 (L) 3.5 - 5.1 mmol/L   Calcium, Ion 1.19 1.15 - 1.40 mmol/L   HCT 40.0 36.0 - 46.0 %   Hemoglobin 13.6 12.0 - 15.0 g/dL   Patient temperature 69.6 F    Collection site RADIAL, ALLEN'S TEST ACCEPTABLE    Drawn by HIDE    Sample type ARTERIAL   Brain natriuretic peptide     Status: Abnormal   Collection Time: 09/29/22  2:19 AM  Result Value Ref Range   B Natriuretic Peptide 222.0 (H) 0.0 - 100.0 pg/mL    Comment: Performed at [facility], [address],  [place], [place] [address]  Basic metabolic panel     Status: Abnormal   Collection Time: 09/29/22  2:19 AM  Result Value Ref Range   Sodium 135 135 - 145 mmol/L   Potassium 2.9 (L) 3.5 - 5.1 mmol/L   Chloride 96 (L) 98 - 111 mmol/L   CO2 27 22 - 32 mmol/L   Glucose, Bld 160 (H) 70 - 99 mg/dL    Comment: Glucose reference range applies only to samples taken after fasting for at least 8 hours.   BUN 11 8 - 23 mg/dL   Creatinine, Ser 4.13 0.44 - 1.00 mg/dL   Calcium 8.5 (L) 8.9 - 10.3 mg/dL   GFR, Estimated >24 >40 mL/min  Comment: (NOTE) Calculated using the CKD-EPI Creatinine Equation (2021)    Anion gap 12 5 - 15    Comment: Performed at [facility], [address], [place], [place] [address]  Magnesium     Status: None   Collection Time: 09/29/22  2:19 AM  Result Value Ref Range   Magnesium 1.9 1.7 - 2.4 mg/dL    Comment: Performed at [facility], [address] [place], [place] [address]  CBC     Status: Abnormal   Collection Time: 09/29/22  2:19 AM  Result Value Ref Range   WBC 14.3 (H) 4.0 - 10.5 K/uL   RBC 4.79 3.87 - 5.11 MIL/uL   Hemoglobin 11.9 (L) 12.0 - 15.0 g/dL   HCT 09.8 11.9 - 14.7 %   MCV 83.7 80.0 - 100.0 fL   MCH 24.8 (L) 26.0 - 34.0 pg   MCHC 29.7 (L) 30.0 - 36.0 g/dL   RDW 82.9 (H) 56.2 - 13.0 %   Platelets 398 150 - 400 K/uL   nRBC 0.0 0.0 - 0.2 %    Comment: Performed at [facility], [address], [place], [place] [address]   *Note: Due to a large number of results and/or encounters for the requested time period, some results have not been displayed. A complete set of results can be found in Results Review.   CT HEAD WO CONTRAST  Addendum Date: 09/29/2022   ADDENDUM REPORT: 09/29/2022 01:23 ADDENDUM: In retrospect the T7 compression fracture described above, does appear to have been present on thoracic spine plain films from 02/19/2022. There is increased sclerosis of the vertebral body compatible with an interval healing response,  but the degree of compression and retropulsion seems unchanged. Electronically Signed   By: Almira Bar M.D.   On: 09/29/2022 01:23   Result Date: 09/29/2022 CLINICAL DATA:  Fall trauma with head trauma, additional blunt poly frequent falls. Trauma. EXAM: CT HEAD WITHOUT CONTRAST CT CERVICAL SPINE WITHOUT CONTRAST CT CHEST, ABDOMEN AND PELVIS WITHOUT CONTRAST TECHNIQUE: Contiguous axial images were obtained from the base of the skull through the vertex without intravenous contrast. Multidetector CT imaging of the cervical spine was performed without intravenous contrast. Multiplanar CT image reconstructions were also generated. Multidetector CT imaging of the chest, abdomen and pelvis was performed following the standard protocol without IV contrast. RADIATION DOSE REDUCTION: This exam was performed according to the departmental dose-optimization program which includes automated exposure control, adjustment of the mA and/or kV according to patient size and/or use of iterative reconstruction technique. COMPARISON:  Head and cervical spine CT both obtained recently 09/24/2022, CTA chest, abdomen and pelvis 10/01/2021, abdomen and pelvis CT 10/03/2020. FINDINGS: CT HEAD FINDINGS Brain: Newly noted is a small acute 3 mm thick right parafalcine subdural bleed in the high frontal area. There is no further evidence of intracranial hemorrhage. There is cerebral atrophy with mild atrophic ventriculomegaly, chronic inferior right frontal infarct, and extensive and fairly confluent hypoattenuating small-vessel disease of the cerebral white matter. There is a chronic right thalamic lacunar infarct and a small chronic infarct anteriorly in the left cerebellar hemisphere. No new cortical based infarct is seen, no mass effect or midline shift. Basal cisterns are clear. Vascular: There is calcification distal right vertebral artery and both siphons. No hyperdense central vessel is seen. Skull: There is osteopenia without  evidence for depressed fractures. No scalp hematoma is seen. Sinuses/Orbits: Negative orbits. Clear paranasal sinuses with mild septal deviation to the right. Fluid  again noted the bilateral mastoid tips, left-greater-than-right, with otherwise clear mastoids. Other: None. CT CERVICAL FINDINGS Alignment: Unchanged. No traumatic listhesis is suspected. A slight degenerative listhesis is again noted at C4-5 and C7-T1. Bone-on-bone anterior atlantodental joint space loss and osteophytosis is again shown. There is no C1-2 offset. Skull base and vertebrae: Generalized osteopenia. There is a chronic mild upper plate anterior wedge compression fracture of the T1 vertebral body. This appears unchanged. At C7, new from 09/24/2022 there is a slight loss of vertebral height of about 15% and mild anterior bulge newly seen in the contour of the anterior vertebral body, findings consistent with a new mild compression fracture. At T3, new since 10/01/2021 there is a compression fracture with up to 30-40% generalized vertebral height loss and mild fragmentation, slight outward spreading of fragments and retropulsion, without cord compromise. Kyphoplasty cement and partial vertebral height loss are unchanged at T4. Soft tissues and spinal canal: There is edema and swelling around the new T3 compression fracture but no notable swelling at C7. There is no definitive spinal canal hematoma. Soft tissue resolution in the thoracic spine is less than in the cervical spine due to patient's overlying arms. There is moderate calcification in both proximal cervical ICAs. No laryngeal mass or thyroid nodules. Disc levels: Preservation of disc heights except for mild disc space loss at C5-6. There are mild facet joint and uncinate spurring changes at most levels, greater on the left, moderate left foraminal stenosis again noted C3-4, C4-5 and C6-7. No other significant foraminal stenosis. Other: None. CT CHEST FINDINGS Cardiovascular: Mild  cardiomegaly. There are three-vessel coronary artery calcifications, aortic tortuosity, and moderate calcific plaques of the aorta and great vessels without aneurysm. Pulmonary arteries and veins appear normal in caliber. Mediastinum/Nodes: Calcified mediastinal and right hilar nodes. No adenopathy is seen without contrast. Moderate hiatal hernia is unchanged. Lungs/Pleura: Small layering pleural effusions new from prior study. No pneumothorax. The main bronchi are clear. There are scarring changes in the apices and bases increased atelectatic change in the left lower lobe versus consolidation. There is diffuse bronchial thickening. No nodules or further new findings. Musculoskeletal: Generalized osteopenia. There is a chronic fracture of the proximal right humerus. There are multiple bilateral chronic healed rib fracture deformities without acute displaced rib fracture. Rudimentary C7 cervical ribs.  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 Kyphoplasty cement is again noted in the T4 T8, T9, T10 and T12 vertebrae. In addition to the new compression fracture described above at T3 there is also a new moderate to severe compression fracture of the T7 vertebral body, with 75-80% loss of the vertebral height in general, with about 4 mm of posteroinferior cortical retropulsion. This mildly narrows the thecal sac eccentric to the left to 6.3 mm AP. Unchanged treated moderate wedging T8, T9, T10 and T12. No definitive evidence of a canal hematoma but there is increased soft tissue fullness alongside T3 and T7. CT ABDOMEN AND PELVIS FINDINGS Hepatobiliary: Increasingly dense liver measuring 20.6 cm length. Clinical correlation advised for amiodarone or heavy metal toxicity versus iron deposition. There are scattered calcified granulomas without visible mass. Gallbladder is absent with no biliary dilatation. Pancreas: No abnormality. Spleen: Costophrenic granulomas. Otherwise no further abnormality. No adjacent hematoma.  Adrenals/Urinary Tract: No significant findings. No calculus. No bladder thickening or contour deforming mass of the kidneys. Stomach/Bowel: No dilatation or wall thickening. Moderate fecal stasis. Diverticulosis without diverticulitis. Vascular/Lymphatic: Heavy aortoiliac calcific plaques with visceral branch vessel atherosclerosis. No adenopathy is seen. Reproductive: Small  calcified uterine fibroid. No other significant findings. Other: Mild generalized body wall edema similar. Trace presacral ascites is seen and mild mesenteric congestive features, also similar. There is no free hemorrhage or free air. Musculoskeletal: Since 10/01/2021, interval right hip nailing. There are healed pubic rami fractures. Osteopenia. Four lumbar type vertebrae with chronic wedging and kyphoplasty L1. Mild biconcave appearance of L2 and mild upper plate concavity at L3. Normal height of L5. All unchanged. No regional acute skeletal fracture is seen. IMPRESSION: 1. 3 mm acute small right parafalcine subdural bleed in the high frontal area. No mass effect or midline shift. 2. Osteopenia and degenerative change of the cervical spine without traumatic listhesis. 3. New T3 compression fracture with 30-40% loss of the vertebral height and mild fragmentation, with slight outward spreading of fragments and mild retropulsion. No cord compromise but there is mild narrowing of the thecal sac. 4. New C7 mild compression fracture with loss of vertebral height of about 15% and mild anterior bulge of the anterior vertebral body. 5. No definitive canal hematoma but there is increased soft tissue fullness alongside T3 and T7. MRI follow-up recommended. Multilevel old kyphoplasty. 6. Small pleural effusions with increased atelectatic change versus consolidation in the left lower lobe. Diffuse bronchitis. 7. Cardiomegaly with calcific CAD, aortic and branch vessel atherosclerosis. 8. Increasingly dense liver. Clinical correlation advised for  amiodarone or heavy metal toxicity versus iron deposition. 9. No acute trauma related findings in the abdomen or pelvis. 10. Chronically noted mesenteric congestion and mild body wall edema. Trace ascites. 11. Constipation and diverticulosis. 12. Interval right hip nailing since last year. 13. Critical Value/emergent results were called by telephone at the time of interpretation on 09/28/2022 at 11:49 pm to provider Dr. Madilyn Hook, who verbally acknowledged these results. Aortic Atherosclerosis (ICD10-I70.0). Electronically Signed: By: Almira Bar M.D. On: 09/29/2022 00:36   CT CERVICAL SPINE WO CONTRAST  Addendum Date: 09/29/2022   ADDENDUM REPORT: 09/29/2022 01:23 ADDENDUM: In retrospect the T7 compression fracture described above, does appear to have been present on thoracic spine plain films from 02/19/2022. There is increased sclerosis of the vertebral body compatible with an interval healing response, but the degree of compression and retropulsion seems unchanged. Electronically Signed   By: Almira Bar M.D.   On: 09/29/2022 01:23   Result Date: 09/29/2022 CLINICAL DATA:  Fall trauma with head trauma, additional blunt poly frequent falls. Trauma. EXAM: CT HEAD WITHOUT CONTRAST CT CERVICAL SPINE WITHOUT CONTRAST CT CHEST, ABDOMEN AND PELVIS WITHOUT CONTRAST TECHNIQUE: Contiguous axial images were obtained from the base of the skull through the vertex without intravenous contrast. Multidetector CT imaging of the cervical spine was performed without intravenous contrast. Multiplanar CT image reconstructions were also generated. Multidetector CT imaging of the chest, abdomen and pelvis was performed following the standard protocol without IV contrast. RADIATION DOSE REDUCTION: This exam was performed according to the departmental dose-optimization program which includes automated exposure control, adjustment of the mA and/or kV according to patient size and/or use of iterative reconstruction technique.  COMPARISON:  Head and cervical spine CT both obtained recently 09/24/2022, CTA chest, abdomen and pelvis 10/01/2021, abdomen and pelvis CT 10/03/2020. FINDINGS: CT HEAD FINDINGS Brain: Newly noted is a small acute 3 mm thick right parafalcine subdural bleed in the high frontal area. There is no further evidence of intracranial hemorrhage. There is cerebral atrophy with mild atrophic ventriculomegaly, chronic inferior right frontal infarct, and extensive and fairly confluent hypoattenuating small-vessel disease of the cerebral white matter. There is a  chronic right thalamic lacunar infarct and a small chronic infarct anteriorly in the left cerebellar hemisphere. No new cortical based infarct is seen, no mass effect or midline shift. Basal cisterns are clear. Vascular: There is calcification distal right vertebral artery and both siphons. No hyperdense central vessel is seen. Skull: There is osteopenia without evidence for depressed fractures. No scalp hematoma is seen. Sinuses/Orbits: Negative orbits. Clear paranasal sinuses with mild septal deviation to the right. Fluid again noted the bilateral mastoid tips, left-greater-than-right, with otherwise clear mastoids. Other: None. CT CERVICAL FINDINGS Alignment: Unchanged. No traumatic listhesis is suspected. A slight degenerative listhesis is again noted at C4-5 and C7-T1. Bone-on-bone anterior atlantodental joint space loss and osteophytosis is again shown. There is no C1-2 offset. Skull base and vertebrae: Generalized osteopenia. There is a chronic mild upper plate anterior wedge compression fracture of the T1 vertebral body. This appears unchanged. At C7, new from 09/24/2022 there is a slight loss of vertebral height of about 15% and mild anterior bulge newly seen in the contour of the anterior vertebral body, findings consistent with a new mild compression fracture. At T3, new since 10/01/2021 there is a compression fracture with up to 30-40% generalized vertebral  height loss and mild fragmentation, slight outward spreading of fragments and retropulsion, without cord compromise. Kyphoplasty cement and partial vertebral height loss are unchanged at T4. Soft tissues and spinal canal: There is edema and swelling around the new T3 compression fracture but no notable swelling at C7. There is no definitive spinal canal hematoma. Soft tissue resolution in the thoracic spine is less than in the cervical spine due to patient's overlying arms. There is moderate calcification in both proximal cervical ICAs. No laryngeal mass or thyroid nodules. Disc levels: Preservation of disc heights except for mild disc space loss at C5-6. There are mild facet joint and uncinate spurring changes at most levels, greater on the left, moderate left foraminal stenosis again noted C3-4, C4-5 and C6-7. No other significant foraminal stenosis. Other: None. CT CHEST FINDINGS Cardiovascular: Mild cardiomegaly. There are three-vessel coronary artery calcifications, aortic tortuosity, and moderate calcific plaques of the aorta and great vessels without aneurysm. Pulmonary arteries and veins appear normal in caliber. Mediastinum/Nodes: Calcified mediastinal and right hilar nodes. No adenopathy is seen without contrast. Moderate hiatal hernia is unchanged. Lungs/Pleura: Small layering pleural effusions new from prior study. No pneumothorax. The main bronchi are clear. There are scarring changes in the apices and bases increased atelectatic change in the left lower lobe versus consolidation. There is diffuse bronchial thickening. No nodules or further new findings. Musculoskeletal: Generalized osteopenia. There is a chronic fracture of the proximal right humerus. There are multiple bilateral chronic healed rib fracture deformities without acute displaced rib fracture. Rudimentary C7 cervical ribs.  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 Kyphoplasty cement is again noted in the T4 T8, T9, T10 and T12 vertebrae. In  addition to the new compression fracture described above at T3 there is also a new moderate to severe compression fracture of the T7 vertebral body, with 75-80% loss of the vertebral height in general, with about 4 mm of posteroinferior cortical retropulsion. This mildly narrows the thecal sac eccentric to the left to 6.3 mm AP. Unchanged treated moderate wedging T8, T9, T10 and T12. No definitive evidence of a canal hematoma but there is increased soft tissue fullness alongside T3 and T7. CT ABDOMEN AND PELVIS FINDINGS Hepatobiliary: Increasingly dense liver measuring 20.6 cm length. Clinical correlation advised for amiodarone or heavy  metal toxicity versus iron deposition. There are scattered calcified granulomas without visible mass. Gallbladder is absent with no biliary dilatation. Pancreas: No abnormality. Spleen: Costophrenic granulomas. Otherwise no further abnormality. No adjacent hematoma. Adrenals/Urinary Tract: No significant findings. No calculus. No bladder thickening or contour deforming mass of the kidneys. Stomach/Bowel: No dilatation or wall thickening. Moderate fecal stasis. Diverticulosis without diverticulitis. Vascular/Lymphatic: Heavy aortoiliac calcific plaques with visceral branch vessel atherosclerosis. No adenopathy is seen. Reproductive: Small calcified uterine fibroid. No other significant findings. Other: Mild generalized body wall edema similar. Trace presacral ascites is seen and mild mesenteric congestive features, also similar. There is no free hemorrhage or free air. Musculoskeletal: Since 10/01/2021, interval right hip nailing. There are healed pubic rami fractures. Osteopenia. Four lumbar type vertebrae with chronic wedging and kyphoplasty L1. Mild biconcave appearance of L2 and mild upper plate concavity at L3. Normal height of L5. All unchanged. No regional acute skeletal fracture is seen. IMPRESSION: 1. 3 mm acute small right parafalcine subdural bleed in the high frontal area.  No mass effect or midline shift. 2. Osteopenia and degenerative change of the cervical spine without traumatic listhesis. 3. New T3 compression fracture with 30-40% loss of the vertebral height and mild fragmentation, with slight outward spreading of fragments and mild retropulsion. No cord compromise but there is mild narrowing of the thecal sac. 4. New C7 mild compression fracture with loss of vertebral height of about 15% and mild anterior bulge of the anterior vertebral body. 5. No definitive canal hematoma but there is increased soft tissue fullness alongside T3 and T7. MRI follow-up recommended. Multilevel old kyphoplasty. 6. Small pleural effusions with increased atelectatic change versus consolidation in the left lower lobe. Diffuse bronchitis. 7. Cardiomegaly with calcific CAD, aortic and branch vessel atherosclerosis. 8. Increasingly dense liver. Clinical correlation advised for amiodarone or heavy metal toxicity versus iron deposition. 9. No acute trauma related findings in the abdomen or pelvis. 10. Chronically noted mesenteric congestion and mild body wall edema. Trace ascites. 11. Constipation and diverticulosis. 12. Interval right hip nailing since last year. 13. Critical Value/emergent results were called by telephone at the time of interpretation on 09/28/2022 at 11:49 pm to provider Dr. Madilyn Hook, who verbally acknowledged these results. Aortic Atherosclerosis (ICD10-I70.0). Electronically Signed: By: Almira Bar M.D. On: 09/29/2022 00:36   CT CHEST ABDOMEN PELVIS WO CONTRAST  Addendum Date: 09/29/2022   ADDENDUM REPORT: 09/29/2022 01:23 ADDENDUM: In retrospect the T7 compression fracture described above, does appear to have been present on thoracic spine plain films from 02/19/2022. There is increased sclerosis of the vertebral body compatible with an interval healing response, but the degree of compression and retropulsion seems unchanged. Electronically Signed   By: Almira Bar M.D.   On:  09/29/2022 01:23   Result Date: 09/29/2022 CLINICAL DATA:  Fall trauma with head trauma, additional blunt poly frequent falls. Trauma. EXAM: CT HEAD WITHOUT CONTRAST CT CERVICAL SPINE WITHOUT CONTRAST CT CHEST, ABDOMEN AND PELVIS WITHOUT CONTRAST TECHNIQUE: Contiguous axial images were obtained from the base of the skull through the vertex without intravenous contrast. Multidetector CT imaging of the cervical spine was performed without intravenous contrast. Multiplanar CT image reconstructions were also generated. Multidetector CT imaging of the chest, abdomen and pelvis was performed following the standard protocol without IV contrast. RADIATION DOSE REDUCTION: This exam was performed according to the departmental dose-optimization program which includes automated exposure control, adjustment of the mA and/or kV according to patient size and/or use of iterative reconstruction technique. COMPARISON:  Head  and cervical spine CT both obtained recently 09/24/2022, CTA chest, abdomen and pelvis 10/01/2021, abdomen and pelvis CT 10/03/2020. FINDINGS: CT HEAD FINDINGS Brain: Newly noted is a small acute 3 mm thick right parafalcine subdural bleed in the high frontal area. There is no further evidence of intracranial hemorrhage. There is cerebral atrophy with mild atrophic ventriculomegaly, chronic inferior right frontal infarct, and extensive and fairly confluent hypoattenuating small-vessel disease of the cerebral white matter. There is a chronic right thalamic lacunar infarct and a small chronic infarct anteriorly in the left cerebellar hemisphere. No new cortical based infarct is seen, no mass effect or midline shift. Basal cisterns are clear. Vascular: There is calcification distal right vertebral artery and both siphons. No hyperdense central vessel is seen. Skull: There is osteopenia without evidence for depressed fractures. No scalp hematoma is seen. Sinuses/Orbits: Negative orbits. Clear paranasal sinuses with  mild septal deviation to the right. Fluid again noted the bilateral mastoid tips, left-greater-than-right, with otherwise clear mastoids. Other: None. CT CERVICAL FINDINGS Alignment: Unchanged. No traumatic listhesis is suspected. A slight degenerative listhesis is again noted at C4-5 and C7-T1. Bone-on-bone anterior atlantodental joint space loss and osteophytosis is again shown. There is no C1-2 offset. Skull base and vertebrae: Generalized osteopenia. There is a chronic mild upper plate anterior wedge compression fracture of the T1 vertebral body. This appears unchanged. At C7, new from 09/24/2022 there is a slight loss of vertebral height of about 15% and mild anterior bulge newly seen in the contour of the anterior vertebral body, findings consistent with a new mild compression fracture. At T3, new since 10/01/2021 there is a compression fracture with up to 30-40% generalized vertebral height loss and mild fragmentation, slight outward spreading of fragments and retropulsion, without cord compromise. Kyphoplasty cement and partial vertebral height loss are unchanged at T4. Soft tissues and spinal canal: There is edema and swelling around the new T3 compression fracture but no notable swelling at C7. There is no definitive spinal canal hematoma. Soft tissue resolution in the thoracic spine is less than in the cervical spine due to patient's overlying arms. There is moderate calcification in both proximal cervical ICAs. No laryngeal mass or thyroid nodules. Disc levels: Preservation of disc heights except for mild disc space loss at C5-6. There are mild facet joint and uncinate spurring changes at most levels, greater on the left, moderate left foraminal stenosis again noted C3-4, C4-5 and C6-7. No other significant foraminal stenosis. Other: None. CT CHEST FINDINGS Cardiovascular: Mild cardiomegaly. There are three-vessel coronary artery calcifications, aortic tortuosity, and moderate calcific plaques of the  aorta and great vessels without aneurysm. Pulmonary arteries and veins appear normal in caliber. Mediastinum/Nodes: Calcified mediastinal and right hilar nodes. No adenopathy is seen without contrast. Moderate hiatal hernia is unchanged. Lungs/Pleura: Small layering pleural effusions new from prior study. No pneumothorax. The main bronchi are clear. There are scarring changes in the apices and bases increased atelectatic change in the left lower lobe versus consolidation. There is diffuse bronchial thickening. No nodules or further new findings. Musculoskeletal: Generalized osteopenia. There is a chronic fracture of the proximal right humerus. There are multiple bilateral chronic healed rib fracture deformities without acute displaced rib fracture. Rudimentary C7 cervical ribs.  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 Kyphoplasty cement is again noted in the T4 T8, T9, T10 and T12 vertebrae. In addition to the new compression fracture described above at T3 there is also a new moderate to severe compression fracture of the T7 vertebral  body, with 75-80% loss of the vertebral height in general, with about 4 mm of posteroinferior cortical retropulsion. This mildly narrows the thecal sac eccentric to the left to 6.3 mm AP. Unchanged treated moderate wedging T8, T9, T10 and T12. No definitive evidence of a canal hematoma but there is increased soft tissue fullness alongside T3 and T7. CT ABDOMEN AND PELVIS FINDINGS Hepatobiliary: Increasingly dense liver measuring 20.6 cm length. Clinical correlation advised for amiodarone or heavy metal toxicity versus iron deposition. There are scattered calcified granulomas without visible mass. Gallbladder is absent with no biliary dilatation. Pancreas: No abnormality. Spleen: Costophrenic granulomas. Otherwise no further abnormality. No adjacent hematoma. Adrenals/Urinary Tract: No significant findings. No calculus. No bladder thickening or contour deforming mass of the kidneys.  Stomach/Bowel: No dilatation or wall thickening. Moderate fecal stasis. Diverticulosis without diverticulitis. Vascular/Lymphatic: Heavy aortoiliac calcific plaques with visceral branch vessel atherosclerosis. No adenopathy is seen. Reproductive: Small calcified uterine fibroid. No other significant findings. Other: Mild generalized body wall edema similar. Trace presacral ascites is seen and mild mesenteric congestive features, also similar. There is no free hemorrhage or free air. Musculoskeletal: Since 10/01/2021, interval right hip nailing. There are healed pubic rami fractures. Osteopenia. Four lumbar type vertebrae with chronic wedging and kyphoplasty L1. Mild biconcave appearance of L2 and mild upper plate concavity at L3. Normal height of L5. All unchanged. No regional acute skeletal fracture is seen. IMPRESSION: 1. 3 mm acute small right parafalcine subdural bleed in the high frontal area. No mass effect or midline shift. 2. Osteopenia and degenerative change of the cervical spine without traumatic listhesis. 3. New T3 compression fracture with 30-40% loss of the vertebral height and mild fragmentation, with slight outward spreading of fragments and mild retropulsion. No cord compromise but there is mild narrowing of the thecal sac. 4. New C7 mild compression fracture with loss of vertebral height of about 15% and mild anterior bulge of the anterior vertebral body. 5. No definitive canal hematoma but there is increased soft tissue fullness alongside T3 and T7. MRI follow-up recommended. Multilevel old kyphoplasty. 6. Small pleural effusions with increased atelectatic change versus consolidation in the left lower lobe. Diffuse bronchitis. 7. Cardiomegaly with calcific CAD, aortic and branch vessel atherosclerosis. 8. Increasingly dense liver. Clinical correlation advised for amiodarone or heavy metal toxicity versus iron deposition. 9. No acute trauma related findings in the abdomen or pelvis. 10.  Chronically noted mesenteric congestion and mild body wall edema. Trace ascites. 11. Constipation and diverticulosis. 12. Interval right hip nailing since last year. 13. Critical Value/emergent results were called by telephone at the time of interpretation on 09/28/2022 at 11:49 pm to provider Dr. Madilyn Hook, who verbally acknowledged these results. Aortic Atherosclerosis (ICD10-I70.0). Electronically Signed: By: Almira Bar M.D. On: 09/29/2022 00:36   CT L-SPINE NO CHARGE  Result Date: 09/29/2022 CLINICAL DATA:  Back trauma.  Level 2 fall on blood thinners. EXAM: CT THORACIC AND LUMBAR SPINE WITHOUT CONTRAST TECHNIQUE: Multidetector CT imaging of the thoracic and lumbar spine was performed without contrast. Multiplanar CT image reconstructions were also generated. RADIATION DOSE REDUCTION: This exam was performed according to the departmental dose-optimization program which includes automated exposure control, adjustment of the mA and/or kV according to patient size and/or use of iterative reconstruction technique. COMPARISON:  Thoracic spine radiographs 02/19/2022 and CT thoracic and lumbar spine 10/01/2021 FINDINGS: CT THORACIC SPINE FINDINGS Alignment: No evidence of traumatic malalignment. Accentuated thoracic kyphosis centered at T7-T8. Vertebrae: Status post vertebroplasty at T4, T8, T9, and T12. There  is new superior endplate compression of T3 when compared with 02/19/2022 with mildly displaced right anterior superior corner fracture (series 3/image 57). Unchanged superior endplate compression of T1 where it is mild, T7 where it is advanced with unchanged retropulsion of the posterior endplate. Extrusion vertebroplasty material through the superior endplate of T12 with indentation of the inferior endplate of T11, unchanged from prior radiograph. Paraspinal and other soft tissues: Unremarkable. See separate report for findings in the chest, abdomen, and pelvis. Disc levels: Multilevel spondylosis, disc space  height loss, and degenerative endplate changes. Osteopenia. No severe spinal canal narrowing. Retropulsion of the posterior vertebral body walls at T3, T7, T8, T9, T10, and T12 cause up to mild effacement of the ventral thecal sac. CT LUMBAR SPINE FINDINGS Segmentation: Transitional anatomy. Absent ribs at T12. Fully sacralized L5. Careful attention to numbering is recommended prior to any intervention. Alignment: No evidence of traumatic malalignment. Vertebrae: L1 vertebroplasty. Superior endplate compression fractures are unchanged in L2 and L3. No evidence of acute fracture. Paraspinal and other soft tissues: Negative. See separate report for findings in the abdomen and pelvis. Disc levels: Multilevel spondylosis, disc space height loss, degenerative endplate changes with vacuum phenomenon. Multilevel moderate-advanced facet arthropathy. Inferior endplate retropulsion at L1 causes mild effacement of the ventral thecal sac. No high-grade spinal canal. IMPRESSION: CT THORACIC SPINE IMPRESSION. 1. Age indeterminate superior endplate compression fracture of T3 with 40% vertebral body height loss and mildly displaced right anterior superior corner fracture. This is new since thoracic spine radiographs 02/19/2022 and may be acute. CT LUMBAR SPINE IMPRESSION. 1. No acute fracture in the lumbar spine. Unchanged superior endplate compression fractures at L2 and L3. 2. Transitional anatomy with complete sacralized L5. Careful attention to numbering is recommended prior to any intervention. Same day CT chest, abdomen, pelvis reported separately. Electronically Signed   By: Minerva Fester M.D.   On: 09/29/2022 00:05   CT T-SPINE NO CHARGE  Result Date: 09/29/2022 CLINICAL DATA:  Back trauma.  Level 2 fall on blood thinners. EXAM: CT THORACIC AND LUMBAR SPINE WITHOUT CONTRAST TECHNIQUE: Multidetector CT imaging of the thoracic and lumbar spine was performed without contrast. Multiplanar CT image reconstructions were  also generated. RADIATION DOSE REDUCTION: This exam was performed according to the departmental dose-optimization program which includes automated exposure control, adjustment of the mA and/or kV according to patient size and/or use of iterative reconstruction technique. COMPARISON:  Thoracic spine radiographs 02/19/2022 and CT thoracic and lumbar spine 10/01/2021 FINDINGS: CT THORACIC SPINE FINDINGS Alignment: No evidence of traumatic malalignment. Accentuated thoracic kyphosis centered at T7-T8. Vertebrae: Status post vertebroplasty at T4, T8, T9, and T12. There is new superior endplate compression of T3 when compared with 02/19/2022 with mildly displaced right anterior superior corner fracture (series 3/image 57). Unchanged superior endplate compression of T1 where it is mild, T7 where it is advanced with unchanged retropulsion of the posterior endplate. Extrusion vertebroplasty material through the superior endplate of T12 with indentation of the inferior endplate of T11, unchanged from prior radiograph. Paraspinal and other soft tissues: Unremarkable. See separate report for findings in the chest, abdomen, and pelvis. Disc levels: Multilevel spondylosis, disc space height loss, and degenerative endplate changes. Osteopenia. No severe spinal canal narrowing. Retropulsion of the posterior vertebral body walls at T3, T7, T8, T9, T10, and T12 cause up to mild effacement of the ventral thecal sac. CT LUMBAR SPINE FINDINGS Segmentation: Transitional anatomy. Absent ribs at T12. Fully sacralized L5. Careful attention to numbering is recommended prior to any  intervention. Alignment: No evidence of traumatic malalignment. Vertebrae: L1 vertebroplasty. Superior endplate compression fractures are unchanged in L2 and L3. No evidence of acute fracture. Paraspinal and other soft tissues: Negative. See separate report for findings in the abdomen and pelvis. Disc levels: Multilevel spondylosis, disc space height loss,  degenerative endplate changes with vacuum phenomenon. Multilevel moderate-advanced facet arthropathy. Inferior endplate retropulsion at L1 causes mild effacement of the ventral thecal sac. No high-grade spinal canal. IMPRESSION: CT THORACIC SPINE IMPRESSION. 1. Age indeterminate superior endplate compression fracture of T3 with 40% vertebral body height loss and mildly displaced right anterior superior corner fracture. This is new since thoracic spine radiographs 02/19/2022 and may be acute. CT LUMBAR SPINE IMPRESSION. 1. No acute fracture in the lumbar spine. Unchanged superior endplate compression fractures at L2 and L3. 2. Transitional anatomy with complete sacralized L5. Careful attention to numbering is recommended prior to any intervention. Same day CT chest, abdomen, pelvis reported separately. Electronically Signed   By: Minerva Fester M.D.   On: 09/29/2022 00:05   DG Pelvis Portable  Result Date: 09/28/2022 CLINICAL DATA:  Trauma fall EXAM: PORTABLE PELVIS 1-2 VIEWS COMPARISON:  05/02/2021, 07/23/2022 FINDINGS: SI joints are non widened. Pubic symphysis appears intact. Old appearing left inferior pubic ramus fracture. Remote left femoral orthopedic hardware. Interval intramedullary rodding of comminuted right intertrochanteric fracture. No definite acute osseous abnormality. IMPRESSION: 1. No acute osseous abnormality. 2. Interval intramedullary rodding of comminuted right intertrochanteric fracture. Electronically Signed   By: Jasmine Pang M.D.   On: 09/28/2022 23:19   DG Chest Port 1 View  Result Date: 09/28/2022 CLINICAL DATA:  Trauma fall EXAM: PORTABLE CHEST 1 VIEW COMPARISON:  07/23/2022 FINDINGS: Mild diffuse interstitial opacity likely chronic disease. Negative for pneumothorax. Healing right proximal humerus fracture with callus. Multiple treated vertebral compression deformities. Multiple remote right rib fractures. Hiatal hernia contributing to retrocardiac opacity. IMPRESSION: 1.  Negative for pneumothorax or pleural effusion. 2. Healing right proximal humerus fracture with callus. 3. Hiatal hernia. Electronically Signed   By: Jasmine Pang M.D.   On: 09/28/2022 23:18    Pending Labs Unresulted Labs (From admission, onward)     Start     Ordered   09/28/22 2243  Urinalysis, Routine w reflex microscopic -Urine, Catheterized  (Trauma Panel)  Once,   URGENT       Question:  Specimen Source  Answer:  Urine, Catheterized   09/28/22 2244            Vitals/Pain Today's Vitals   09/29/22 0241 09/29/22 0330 09/29/22 0400 09/29/22 0406  BP:  101/74 128/60   Pulse:  60 60   Resp:  16 18   Temp: 97.9 F (36.6 C)     TempSrc: Oral     SpO2:  96% 94%   PainSc:    Asleep    Isolation Precautions No active isolations  Medications Medications  amiodarone (PACERONE) tablet 200 mg (has no administration in time range)  pantoprazole (PROTONIX) EC tablet 40 mg (has no administration in time range)  rosuvastatin (CRESTOR) tablet 20 mg (has no administration in time range)  sertraline (ZOLOFT) tablet 12.5 mg (has no administration in time range)  ipratropium-albuterol (DUONEB) 0.5-2.5 (3) MG/3ML nebulizer solution 3 mL (has no administration in time range)  potassium chloride 10 mEq in 100 mL IVPB (has no administration in time range)  acetaminophen (TYLENOL) tablet 650 mg (has no administration in time range)  oxyCODONE (Oxy IR/ROXICODONE) immediate release tablet 5 mg (has no administration in  time range)  HYDROmorphone (DILAUDID) injection 0.5 mg (has no administration in time range)  polyethylene glycol (MIRALAX / GLYCOLAX) packet 17 g (has no administration in time range)  prochlorperazine (COMPAZINE) injection 5 mg (has no administration in time range)  fentaNYL (SUBLIMAZE) injection 25 mcg (25 mcg Intravenous Given 09/28/22 2308)  fentaNYL (SUBLIMAZE) injection 25 mcg (25 mcg Intravenous Given 09/28/22 2350)  ondansetron (ZOFRAN) injection 4 mg (4 mg Intravenous  Given 09/29/22 0034)  morphine (PF) 4 MG/ML injection 4 mg (4 mg Intravenous Given 09/29/22 0035)  ipratropium-albuterol (DUONEB) 0.5-2.5 (3) MG/3ML nebulizer solution 3 mL (3 mLs Nebulization Given 09/29/22 0145)    Mobility non-ambulatory     Focused Assessments See trauma documentation  R Recommendations: See Admitting Provider Note  Report given to:   Additional Notes:  Pt A&Ox4. [place] J in place. Bruising to left hip and bilateral legs. Pt non ambulatory due to injuries.

## 2022-09-29 NOTE — Evaluation (Signed)
Occupational Therapy Evaluation Patient Details Name: [name] MRN: [id number] DOB: March 16, 1930 Today's Date: 09/29/2022   History of Present Illness [name] is a 89 y.o. female who presented to [facility] ED from SNF due to an unwitnessed fall. Imaging revealed subdural hematoma seen on CT, new T3 and C7 compression fracture. PMHx:  osteoporosis,T9,10,11, L1 and L2 compression fxs w/ Kyphoplasty, L hip fracture, OA, CVA, CAD, CA, HTN   Clinical Impression   [name] was evaluated s/p the above admission list. She is from SNF but reports being mod I for BADLs and indep with STS transfers at baseline. Upon evaluation she was significantly limited by pain, nausea with emesis, weakness and decreased activity tolerance. Overall she required max A to roll R&L to remove soiled brief. Due to the deficits listed below she also needs total A for nearly all ADLs at bed level. Pt will benefit from continued acute OT services to faciliate safe d/c back to [facility]        Recommendations for follow up therapy are one component of a multi-disciplinary discharge planning process, led by the attending physician.  Recommendations may be updated based on patient status, additional functional criteria and insurance authorization.   Assistance Recommended at Discharge Frequent or constant Supervision/Assistance  Patient can return home with the following A lot of help with walking and/or transfers;A lot of help with bathing/dressing/bathroom;Assistance with cooking/housework;Assistance with feeding;Direct supervision/assist for medications management;Direct supervision/assist for financial management;Help with stairs or ramp for entrance;Assist for transportation    Functional Status Assessment  Patient has had a recent decline in their functional status and demonstrates the ability to make significant improvements in function in a reasonable and predictable amount of time.  Equipment Recommendations   None recommended by OT       Precautions / Restrictions Precautions Precautions: Fall;Cervical Precaution Booklet Issued: No Required Braces or Orthoses: Cervical Brace Cervical Brace: Hard collar Restrictions Weight Bearing Restrictions: No      Mobility Bed Mobility Overal bed mobility: Needs Assistance Bed Mobility: Rolling Rolling: Max assist         General bed mobility comments: Less pain with rolling to the R    Transfers Overall transfer level: Needs assistance                 General transfer comment: unable to attempt          ADL either performed or assessed with clinical judgement   ADL Overall ADL's : Needs assistance/impaired Eating/Feeding: NPO   Grooming: Moderate assistance;Bed level   Upper Body Bathing: Maximal assistance;Bed level   Lower Body Bathing: Total assistance;+2 for physical assistance;+2 for safety/equipment;Bed level   Upper Body Dressing : Total assistance   Lower Body Dressing: Total assistance;+2 for physical assistance;+2 for safety/equipment;Bed level   Toilet Transfer: Total assistance;+2 for safety/equipment;+2 for physical assistance   Toileting- Clothing Manipulation and Hygiene: Total assistance;+2 for physical assistance;+2 for safety/equipment;Bed level       Functional mobility during ADLs: +2 for physical assistance;+2 for safety/equipment;Maximal assistance (bed level) General ADL Comments: significantly limited by pain, only able to tolerate rolling this date.     Vision Baseline Vision/History: 1 Wears glasses Vision Assessment?: No apparent visual deficits Additional Comments: wears glasses but does not have them acutely     Perception Perception Perception Tested?: No   Praxis Praxis Praxis tested?: Not tested    Pertinent Vitals/Pain Pain Assessment Pain Assessment: Faces Faces Pain Scale: Hurts worst Pain Location: "back" Pain Descriptors /  Indicators: Discomfort, Grimacing Pain  Intervention(s): Limited activity within patient's tolerance, Monitored during session, Patient requesting pain meds-RN notified     Hand Dominance     Extremity/Trunk Assessment Upper Extremity Assessment Upper Extremity Assessment: Generalized weakness (limited over head AROM)   Lower Extremity Assessment Lower Extremity Assessment: Defer to PT evaluation   Cervical / Trunk Assessment Cervical / Trunk Assessment: Other exceptions Cervical / Trunk Exceptions: T3 and C7 compression   Communication Communication Communication: HOH   Cognition Arousal/Alertness: Awake/alert Behavior During Therapy: Anxious Overall Cognitive Status: Within Functional Limits for tasks assessed                                 General Comments: anxious in anticipation of pain - otherwise WFL     General Comments  VSS. episode of emesis, RN notified            Home Living Family/patient expects to be discharged to:: Skilled nursing facility             Additional Comments: [facility]      Prior Functioning/Environment Prior Level of Function : Needs assist             Mobility Comments: indep with STS transfers, mobilizes at [facility] level ADLs Comments: mod I for BADLs        OT Problem List: Decreased strength;Decreased range of motion;Decreased activity tolerance;Impaired balance (sitting and/or standing);Decreased safety awareness;Decreased knowledge of use of DME or AE;Decreased knowledge of precautions;Pain      OT Treatment/Interventions: Self-care/ADL training;Therapeutic exercise;DME and/or AE instruction;Therapeutic activities;Patient/family education;Balance training    OT Goals(Current goals can be found in the care plan section) Acute Rehab OT Goals Patient Stated Goal: less pain and nausea OT Goal Formulation: With patient Time For Goal Achievement: 10/13/22 Potential to Achieve Goals: Good ADL Goals Pt Will Perform Grooming: with  set-up;sitting Pt Will Perform Upper Body Dressing: with min assist;sitting Pt Will Perform Lower Body Dressing: with mod assist;sit to/from stand Pt Will Transfer to Toilet: with mod assist;stand pivot transfer;bedside commode Additional ADL Goal #1: Pt will complete bed mobility with min A as a precursor to ADLs  OT Frequency: Min 2X/week       AM-PAC OT "6 Clicks" Daily Activity     Outcome Measure Help from another person eating meals?: Total Help from another person taking care of personal grooming?: A Lot Help from another person toileting, which includes using toliet, bedpan, or urinal?: Total Help from another person bathing (including washing, rinsing, drying)?: Total Help from another person to put on and taking off regular upper body clothing?: Total Help from another person to put on and taking off regular lower body clothing?: Total 6 Click Score: 7   End of Session Equipment Utilized During Treatment: Cervical collar Nurse Communication: Mobility status;Patient requests pain meds (emesis)  Activity Tolerance: Patient limited by pain Patient left: in bed;with call bell/phone within reach;with bed alarm set  OT Visit Diagnosis: Unsteadiness on feet (R26.81);Other abnormalities of gait and mobility (R26.89);Muscle weakness (generalized) (M62.81);History of falling (Z91.81);Repeated falls (R29.6);Pain                Time: 0935-1001 OT Time Calculation (min): 26 min Charges:  OT General Charges $OT Visit: 1 Visit OT Evaluation $OT Eval Moderate Complexity: 1 Mod OT Treatments $Therapeutic Activity: 8-22 mins  Derenda Mis, OTR/L Acute Rehabilitation Services Office [phone] Secure Chat Communication Preferred   Dawn Blitz  Parrish 09/29/2022, 10:19 AM

## 2022-09-29 NOTE — ED Notes (Signed)
C-collar exchanged for [place] J at this time.

## 2022-09-30 ENCOUNTER — Inpatient Hospital Stay (HOSPITAL_COMMUNITY): Payer: Medicare Other

## 2022-09-30 DIAGNOSIS — R011 Cardiac murmur, unspecified: Secondary | ICD-10-CM | POA: Diagnosis not present

## 2022-09-30 DIAGNOSIS — S065XAA Traumatic subdural hemorrhage with loss of consciousness status unknown, initial encounter: Secondary | ICD-10-CM | POA: Diagnosis not present

## 2022-09-30 LAB — BASIC METABOLIC PANEL
Anion gap: 9 (ref 5–15)
BUN: 11 mg/dL (ref 8–23)
CO2: 28 mmol/L (ref 22–32)
Calcium: 8.1 mg/dL — ABNORMAL LOW (ref 8.9–10.3)
Chloride: 94 mmol/L — ABNORMAL LOW (ref 98–111)
Creatinine, Ser: 0.54 mg/dL (ref 0.44–1.00)
GFR, Estimated: >60 mL/min (ref >60)
Glucose, Bld: 130 mg/dL — ABNORMAL HIGH (ref 70–99)
Potassium: 3.5 mmol/L (ref 3.5–5.1)
Sodium: 131 mmol/L — ABNORMAL LOW (ref 135–145)

## 2022-09-30 LAB — ECHOCARDIOGRAM LIMITED
AR max vel: 2.31 cm2
AV Area VTI: 2.43 cm2
AV Area mean vel: 2.35 cm2
AV Mean grad: 12 mmHg
AV Peak grad: 22.1 mmHg
Ao pk vel: 2.35 m/s
Area-P 1/2: 2.69 cm2
S' Lateral: 2.3 cm

## 2022-09-30 LAB — CBC
HCT: 41.5 % (ref 36.0–46.0)
Hemoglobin: 12.4 g/dL (ref 12.0–15.0)
MCH: 24.7 pg — ABNORMAL LOW (ref 26.0–34.0)
MCHC: 29.9 g/dL — ABNORMAL LOW (ref 30.0–36.0)
MCV: 82.5 fL (ref 80.0–100.0)
Platelets: 390 10*3/uL (ref 150–400)
RBC: 5.03 MIL/uL (ref 3.87–5.11)
RDW: 16.8 % — ABNORMAL HIGH (ref 11.5–15.5)
WBC: 10.9 10*3/uL — ABNORMAL HIGH (ref 4.0–10.5)
nRBC: 0 % (ref 0.0–0.2)

## 2022-09-30 LAB — MAGNESIUM: Magnesium: 2.1 mg/dL (ref 1.7–2.4)

## 2022-09-30 MED ORDER — MORPHINE SULFATE (PF) 2 MG/ML IV SOLN
2.0000 mg | INTRAVENOUS | Status: DC | PRN
  Administered 2022-09-30 – 2022-10-06 (×25): 2 mg via INTRAVENOUS
  Filled 2022-09-30 (×25): qty 1

## 2022-09-30 NOTE — Progress Notes (Signed)
  Echocardiogram 2D Echocardiogram has been performed.  [name] 09/30/2022, 8:47 AM

## 2022-09-30 NOTE — TOC CAGE-AID Note (Signed)
Transition of Care [facility]) - CAGE-AID Screening  Patient Details  Name: [name] MRN: [id number] Date of Birth: Sep 18, 1929  Clinical Narrative:  Patient admitted after a fall at home. Patient denies any alcohol or drug use, no need for substance abuse resources at this time.  CAGE-AID Screening:    Have You Ever Felt You Ought to Cut Down on Your Drinking or Drug Use?: No Have People Annoyed You By Critizing Your Drinking Or Drug Use?: No Have You Felt Bad Or Guilty About Your Drinking Or Drug Use?: No Have You Ever Had a Drink or Used Drugs First Thing In The Morning to Steady Your Nerves or to Get Rid of a Hangover?: No CAGE-AID Score: 0  Substance Abuse Education Offered: No

## 2022-09-30 NOTE — Progress Notes (Signed)
PT Cancellation Note  Patient Details Name: [name] MRN: [id number] DOB: 1930/02/10   Cancelled Treatment:    Reason Eval/Treat Not Completed: Patient declined, no reason specified (pt stating, "not today," and then "please just go, let me rest," despite max encouragement to participate in physical therapy evaluation).  Lillia Pauls, PT, DPT Acute Rehabilitation Services Office [phone]    Norval Morton 09/30/2022, 12:32 PM

## 2022-09-30 NOTE — Hospital Course (Signed)
PMH of HTN, polycythemia, depression, GERD, paroxysmal A-fib on Eliquis, PE DVT, stroke, multiple prior kyphoplasty presented to hospital with complaints of an unwitnessed fall.  Found to have a T3 and C7 compression fracture.  Neurosurgery was consulted.  Recommendation is for conservative measures.  IR consulted for kyphoplasty.

## 2022-09-30 NOTE — Progress Notes (Signed)
[facility] Progress Note Patient: [name] JXB:[id number] DOB: July 27, 1929 DOA: 09/28/2022  DOS: the patient was seen and examined on 09/30/2022  Brief hospital course: PMH of HTN, polycythemia, depression, GERD, paroxysmal A-fib on Eliquis, PE DVT, stroke, multiple prior kyphoplasty presented to hospital with complaints of an unwitnessed fall.  Found to have a T3 and C7 compression fracture.  Neurosurgery was consulted.  Recommendation is for conservative measures.  IR consulted for kyphoplasty. Assessment and Plan: Subdural hematoma, post fall, POA Neurosurgery evaluated the patient. Repeat CT scan shows no evidence of acute worsening. Currently no focal deficit. Current recommendation is to hold Eliquis for 1 week. No intervention recommended. PT OT recommended SNF.   Acute hypoxic respiratory failure Hypoxia appears to have resolved. Will monitor. No pneumothorax or pleural effusion seen on imaging Incentive spirometer DuoNeb as needed for wheezing   C7 and T3 compression fracture  Prior history of multiple kyphoplasty  IR consulted to assess for possible kyphoplasty Pain control in place as well as PRN bowel regimen No recommendation from neurology for intervention for further   Leukocytosis Likely stress reaction.  Will monitor.  Hypokalemia Improving. Monitor.   Paroxysmal A-fib on Eliquis Home Eliquis held due to the above Resume home amiodarone Monitor on telemetry   GERD Resume home PPI   Hyperlipidemia Resume home Crestor   Chronic anxiety/depression Resume home sertraline.   Subjective: Pain still present.  No nausea no vomiting no fever no chills.  Oral intake adequate.  Physical Exam: General: in Mild distress, No Rash Cardiovascular: S1 and S2 Present, No Murmur Respiratory: Good respiratory effort, Bilateral Air entry present. No Crackles, No wheezes Abdomen: Bowel Sound present, No tenderness Extremities: No edema Neuro: Alert and  oriented x3, no new focal deficit  Data Reviewed: I have Reviewed nursing notes, Vitals, and Lab results. Since last encounter, pertinent lab results CBC and BMP   . I have ordered test including CBC and BMP  .   Disposition: Status is: Inpatient Remains inpatient appropriate because: Need for pain control, awaiting IR clearance. SCDs Start: 09/29/22 0400 Family Communication: No one at bedside Level of care: Telemetry Medical continue telemetry for now. Vitals:   09/29/22 1714 09/29/22 1931 09/30/22 0422 09/30/22 0920  BP: 121/69 (!) 156/68 (!) 145/64 (!) 146/69  Pulse: 76 70 67 70  Resp: 16 16 18 17   Temp: 98.6 F (37 C) 97.8 F (36.6 C) 99 F (37.2 C) 98 F (36.7 C)  TempSrc: Oral Oral Oral   SpO2: 100% 96% 94% 95%     Author: Lynden Oxford, MD 09/30/2022 6:08 PM  Please look on [email or web address] to find out who is on call.

## 2022-10-01 ENCOUNTER — Encounter: Payer: Self-pay | Admitting: Hematology & Oncology

## 2022-10-01 DIAGNOSIS — S12690A Other displaced fracture of seventh cervical vertebra, initial encounter for closed fracture: Secondary | ICD-10-CM | POA: Diagnosis present

## 2022-10-01 DIAGNOSIS — S22030A Wedge compression fracture of third thoracic vertebra, initial encounter for closed fracture: Secondary | ICD-10-CM | POA: Diagnosis present

## 2022-10-01 DIAGNOSIS — S065XAA Traumatic subdural hemorrhage with loss of consciousness status unknown, initial encounter: Secondary | ICD-10-CM | POA: Diagnosis not present

## 2022-10-01 LAB — BASIC METABOLIC PANEL
Anion gap: 7 (ref 5–15)
BUN: 11 mg/dL (ref 8–23)
CO2: 30 mmol/L (ref 22–32)
Calcium: 7.8 mg/dL — ABNORMAL LOW (ref 8.9–10.3)
Chloride: 94 mmol/L — ABNORMAL LOW (ref 98–111)
Creatinine, Ser: 0.54 mg/dL (ref 0.44–1.00)
GFR, Estimated: >60 mL/min (ref >60)
Glucose, Bld: 113 mg/dL — ABNORMAL HIGH (ref 70–99)
Potassium: 3.4 mmol/L — ABNORMAL LOW (ref 3.5–5.1)
Sodium: 131 mmol/L — ABNORMAL LOW (ref 135–145)

## 2022-10-01 LAB — CBC
HCT: 39.8 % (ref 36.0–46.0)
Hemoglobin: 11.9 g/dL — ABNORMAL LOW (ref 12.0–15.0)
MCH: 24.9 pg — ABNORMAL LOW (ref 26.0–34.0)
MCHC: 29.9 g/dL — ABNORMAL LOW (ref 30.0–36.0)
MCV: 83.4 fL (ref 80.0–100.0)
Platelets: 363 10*3/uL (ref 150–400)
RBC: 4.77 MIL/uL (ref 3.87–5.11)
RDW: 16.9 % — ABNORMAL HIGH (ref 11.5–15.5)
WBC: 8.8 10*3/uL (ref 4.0–10.5)
nRBC: 0 % (ref 0.0–0.2)

## 2022-10-01 LAB — MAGNESIUM: Magnesium: 2.1 mg/dL (ref 1.7–2.4)

## 2022-10-01 MED ORDER — POTASSIUM CHLORIDE CRYS ER 20 MEQ PO TBCR
40.0000 meq | EXTENDED_RELEASE_TABLET | Freq: Once | ORAL | Status: AC
  Administered 2022-10-01: 40 meq via ORAL
  Filled 2022-10-01: qty 2

## 2022-10-01 NOTE — TOC Initial Note (Signed)
Transition of Care [facility]) - Initial/Assessment Note    Patient Details  Name: [name] MRN: [id number] Date of Birth: 02-03-1930  Transition of Care [facility] CM/SW Contact:    Taegan Standage A [place], Theresia Majors Phone Number: 10/01/2022, 4:39 PM  Clinical Narrative:                  CSW met with pt at bedside. She said that CSW could contact pt's daughter regarding discharge plans because she reports "being confused" at the moment.   CSW will reach out to pt's daughter, [name] and assist with discharge planning.   TOC will continue to follow.   Expected Discharge Plan: Skilled Nursing Facility Barriers to Discharge: Continued Medical Work up, SNF Pending bed offer   Patient Goals and CMS Choice            Expected Discharge Plan and Services In-house Referral: Clinical Social Work     Living arrangements for the past 2 months: Assisted Living Facility                                      Prior Living Arrangements/Services Living arrangements for the past 2 months: Assisted Living Facility Lives with:: Facility Resident          Need for Family Participation in Patient Care: Yes (Comment) Care giver support system in place?: Yes (comment)   Criminal Activity/Legal Involvement Pertinent to Current Situation/Hospitalization: No - Comment as needed  Activities of Daily Living      Permission Sought/Granted   Permission granted to share information with : Yes, Verbal Permission Granted  Share Information with NAME: [name]     Permission granted to share info w Relationship: daughter  Permission granted to share info w Contact Information: [name]  Emotional Assessment Appearance:: Appears younger than stated age Attitude/Demeanor/Rapport: Lethargic Affect (typically observed): Appropriate Orientation: : Oriented to Self, Oriented to Place Alcohol / Substance Use: Not Applicable Psych Involvement: No (comment)  Admission diagnosis:  Subdural  hematoma (HCC) [S06.5XAA] Hypoxia [R09.02] Subdural bleeding (HCC) [I62.00] Fall, initial encounter [W19.XXXA] Compression fracture of C7 vertebra, initial encounter (HCC) [S12.690A] Compression fracture of T7 vertebra, initial encounter (HCC) [S22.060A] Compression fracture of T3 vertebra, initial encounter (HCC) [S22.030A] Patient Active Problem List   Diagnosis Date Noted   Compression fracture of C7 vertebra (HCC) 10/01/2022   Compression fracture of T3 vertebra (HCC) 10/01/2022   Subdural hematoma (HCC) 09/29/2022   Humerus fracture 07/24/2022   Presence of drug-eluting stent in right coronary artery    Dilutional hyponatremia 02/25/2022   ST elevation myocardial infarction involving right coronary artery (HCC)    Paroxysmal atrial fibrillation (HCC)    Closed right radial fracture 11/25/2021   Protein-calorie malnutrition, severe 10/09/2021   Acute encephalopathy 10/07/2021   Compression fracture of T7 vertebra  10/07/2021   Rib pain on right side 01/24/2021   PE (pulmonary thromboembolism) (HCC) 12/25/2020   Lumbar compression fracture (HCC) 11/16/2020   GERD (gastroesophageal reflux disease)    DVT (deep venous thrombosis) (HCC)    Diverticulitis    Coronary artery disease involving native coronary artery of native heart with unstable angina pectoris (HCC)    Acute midline thoracic back pain 03/04/2020   Closed left hip fracture (HCC) 12/24/2019   ICH (intracerebral hemorrhage) (HCC) 11/04/2019   Intractable back pain 09/13/2019   Compression fracture of T9 vertebra (HCC) 09/13/2019   Chronic diastolic CHF (  congestive heart failure) (HCC) 09/13/2019   Overweight (BMI 25.0-29.9) 09/13/2019   Constipation 09/13/2019   Raynaud's phenomenon without gangrene 05/19/2018   Iron deficiency anemia due to chronic blood loss 12/30/2017   Insomnia 06/25/2017   Superficial thrombophlebitis 04/06/2014   Breast cancer (HCC) 11/05/2011   Polycythemia 11/05/2011   Stroke (HCC)  03/29/2011   OSA (obstructive sleep apnea) 03/29/2011   OA (osteoarthritis) of knee 03/29/2011   Hiatal hernia 03/29/2011   E. coli gastroenteritis 03/29/2011   Depression with anxiety 03/29/2011   TIA (transient ischemic attack) 01/16/2011   CARDIOMYOPATHY 08/03/2009   DJD -- pain mgmt 08/16/2008   GAIT DISTURBANCE 10/21/2007   ANXIETY DEPRESSION 10/08/2007   Hyperlipidemia with target LDL less than 70 06/25/2007   Takotsubo cardiomyopathy 06/25/2007   GERD without esophagitis 06/25/2007   Essential hypertension 09/30/2006   Migraine with aura 09/30/2006   Hyperplastic colon polyp 06/2001   PCP:  [facility]:   [id number] [place], [place] - [address] AT [facility] OF [address] [place] [address] Phone: [phone] Fax: [phone]  [facility] [address] [place] [address] Phone: [phone] Fax: [phone]  [facility] [address] [place] [address] Phone: [phone] Fax: [phone]     Social Determinants of Health (SDOH) Social History: SDOH Screenings   Food Insecurity: No Food Insecurity (07/24/2022)  Housing: Low Risk  (07/24/2022)  Transportation Needs: No Transportation Needs (07/24/2022)  Utilities: Not At Risk (07/24/2022)  Depression (PHQ2-9): Low Risk  (12/19/2021)  Financial Resource Strain: Low Risk  (08/10/2021)  Tobacco Use: High Risk (09/28/2022)   SDOH Interventions:     Readmission Risk Interventions     No data to display

## 2022-10-01 NOTE — Consult Note (Signed)
Chief Complaint: Patient was seen in consultation today for C7, T3 compression fracture  Referring Physician(s): Dr. Lynden Oxford  Supervising Physician: Julieanne Cotton  Patient Status: [facility] - In-pt  History of Present Illness: [name] is a 89 y.o. female who presented to [facility] ED after an unwitnessed fall. Imaging revealed subdural hematoma seen on CT, as well as new T3 and C7 compression fracture. She has undergone prior treatment at T4 (11/10/19) as well as T8 (07/14/20).  She has experienced several falls recently including a hip fracture in March earlier this year for which she required operative management. Case reviewed by Dr. Corliss Skains for possible vertebroplasty/kyphoplasty.  She is approved for T3 intervention, however the C7 fracture is too high and associated with increased risk.  Discussed with patient and daughter who are interested in pursuing treatment options.   Patient assessed at bedside.  She is distracted by her pain.  She is having difficulty in C-collar with meal.  RN reports she is requiring scheduled IV pain medication.  Patient reports "pain in my should blades is terrible."  Past Medical History:  Diagnosis Date   CAD (coronary artery disease)    mild nonobstructive disease on cath in 2003   Cancer [facility])    Cardiomyopathy    Probable Takotsubo, severe CP w/ normal cath in 1994. Severe CP in 2003 w/ widespread T wave inversions on ECG. Cath w/ minimal coronary disease but LV-gram showed periapical severe hypokinesis and basilar hyperkinesia (EF 40%). Last echo in 4/09 confirmed full LV functional recovery with EF 60%, no regional wall motion abnormalities, mild to moderate LVH.   Chronic diastolic CHF (congestive heart failure) (HCC) 09/13/2019   CVA (cerebral infarction)    Depression with anxiety 03/29/2011   lost husband 3'09   DVT (deep venous thrombosis) (HCC)    after venous ablation, R leg   Fracture 09/10/2007   L2, status post  vertebroplasty of L2 performed by IR   GERD (gastroesophageal reflux disease)    Hemorrhoids    Hiatal hernia 03/29/2011   no nerve problems   Hyperlipidemia    Hyperplastic colon polyp 06/2001   Hypertension    Iron deficiency anemia due to chronic blood loss 12/30/2017   OA (osteoarthritis) of knee 03/29/2011   w/ bilateral knee pain-not a problem now   OSA (obstructive sleep apnea) 03/29/2011   no cpap used, not a problem now.   Osteoporosis    Otalgia of both ears    Dr. Emeline Darling   Polycythemia    Stroke [facility]) 03/29/2011   CVA x2 -last 10'12-?TIA(visual problems)   Varicose vein     Past Surgical History:  Procedure Laterality Date   APPENDECTOMY     BACK SURGERY     BREAST BIOPSY     BREAST CYST EXCISION     BREAST LUMPECTOMY Left 2013   left stage I left breast cancer   CHOLECYSTECTOMY  04/09/2011   Procedure: LAPAROSCOPIC CHOLECYSTECTOMY WITH INTRAOPERATIVE CHOLANGIOGRAM;  Surgeon: Adolph Pollack, MD;  Location: [facility] ORS;  Service: General;  Laterality: N/A;   CORONARY ATHERECTOMY N/A 02/26/2022   Procedure: CORONARY ATHERECTOMY;  Surgeon: Yvonne Kendall, MD;  Location: [facility] INVASIVE CV LAB;  Service: Cardiovascular;  Laterality: N/A;   CORONARY LITHOTRIPSY N/A 02/26/2022   Procedure: CORONARY LITHOTRIPSY;  Surgeon: Yvonne Kendall, MD;  Location: [facility] INVASIVE CV LAB;  Service: Cardiovascular;  Laterality: N/A;   CORONARY STENT INTERVENTION N/A 02/26/2022   Procedure: CORONARY STENT INTERVENTION;  Surgeon: Yvonne Kendall, MD;  Location: [facility] INVAS[facility] CV LAB;  Service: Cardiovascular;  Laterality: N/A;   CORONARY ULTRASOUND/IVUS N/A 02/26/2022   Procedure: Intravascular Ultrasound/IVUS;  Surgeon: Yvonne Kendall, MD;  Location: [facility] CV [facility];  Service: Cardiovascular;  Laterality: N/A;   Dental Extraction     L maxillary molar   FEMUR IM NAIL Right 07/25/2022   Procedure: INTRAMEDULLARY (IM) NAIL FEMORAL;  Surgeon: Yolonda Kida, MD;  Location: [facility] OR;   Service: Orthopedics;  Laterality: Right;  hana table,carm.synthesis   INTRAMEDULLARY (IM) NAIL INTERTROCHANTERIC Left 12/25/2019   Procedure: INTRAMEDULLARY (IM) NAIL INTERTROCHANTRIC;  Surgeon: Durene Romans, MD;  Location: [facility] ORS;  Service: Orthopedics;  Laterality: Left;   IR KYPHO THORACIC WITH BONE BIOPSY  09/15/2019   IR KYPHO THORACIC WITH BONE BIOPSY  07/13/2020       IR KYPHO THORACIC WITH BONE BIOPSY  07/13/2020   IR VERTEBROPLASTY CERV/THOR BX INC UNI/BIL INC/INJECT/IMAGING  11/10/2019   KYPHOPLASTY N/A 11/16/2020   Procedure: T11,  L1,KYPHOPLASTY;  Surgeon: Venita Lick, MD;  Location: [facility] OR;  Service: Orthopedics;  Laterality: N/A;   KYPHOSIS SURGERY  08/2007   cement used   LEFT HEART CATH AND CORONARY ANGIOGRAPHY N/A 02/23/2022   Procedure: LEFT HEART CATH AND CORONARY ANGIOGRAPHY;  Surgeon: Swaziland, Peter M, MD;  Location: [facility] INVASIVE CV LAB;  Service: Cardiovascular;  Laterality: N/A;   LEFT HEART CATH AND CORONARY ANGIOGRAPHY N/A 02/26/2022   Procedure: LEFT HEART CATH AND CORONARY ANGIOGRAPHY;  Surgeon: Yvonne Kendall, MD;  [facility];  Service: Cardiovascular;  Laterality: N/A;   OVARIAN CYST SURGERY     left   RADIOLOGY WITH ANESTHESIA N/A 11/10/2019   Procedure: VERTEBROPLASTY;  Surgeon: Julieanne Cotton, MD;  Location: [facility] OR;  Service: Radiology;  Laterality: N/A;   TEMPORARY PACEMAKER N/A 02/26/2022   Procedure: TEMPORARY PACEMAKER;  Surgeon: Yvonne Kendall, MD;  [facility];  Service: Cardiovascular;  Laterality: N/A;   TONSILLECTOMY     TOTAL KNEE ARTHROPLASTY  06/2010   left   TUBAL LIGATION      Allergies: Penicillins, Valsartan, Atorvastatin, Carvedilol, Ezetimibe, Fluvastatin sodium, Magnesium hydroxide, Meloxicam, Pneumovax [pneumococcal polysaccharide vaccine], Quinapril hcl, Simvastatin, Topamax [topiramate], and Vit d-vit e-safflower oil  Medications: Prior to Admission medications   Medication Sig Start Date End Date  Taking? Authorizing Provider  calcium carbonate (TUMS - DOSED IN MG ELEMENTAL CALCIUM) 500 MG chewable tablet Chew 1 tablet (200 mg of elemental calcium total) by mouth 3 (three) times daily as needed for indigestion or heartburn. 07/28/22  Yes Dorcas Carrow, MD  furosemide (LASIX) 20 MG tablet Take 1 tablet (20 mg total) by mouth daily as needed for fluid or edema (for weigh gain of 3 pounds or more over one day). Patient taking differently: Take 40 mg by mouth daily. 02/28/22  Yes Arty Baumgartner, NP  LORazepam (ATIVAN) 0.5 MG tablet Take 0.5 mg by mouth 2 (two) times daily.   Yes [provider]  nitroGLYCERIN (NITROSTAT) 0.4 MG SL tablet Place 1 tablet (0.4 mg total) under the tongue every 5 (five) minutes x 3 doses as needed for chest pain. 02/28/22  Yes Laverda Page B, NP  ondansetron (ZOFRAN-ODT) 4 MG disintegrating tablet Take 4 mg by mouth every 8 (eight) hours as needed. 08/24/22  Yes [provider]  traMADol (ULTRAM) 50 MG tablet Take 50 mg by mouth daily as needed. 09/05/22  Yes [provider]  acetaminophen (TYLENOL) 325 MG tablet Take 650 mg by mouth every  6 (six) hours as needed for mild pain.    [provider]  alendronate (FOSAMAX) 70 MG tablet Take 1 tablet (70 mg total) by mouth once a week. Take with a full glass of water on an empty stomach. 12/19/21   Wanda Plump, MD  amiodarone (PACERONE) 200 MG tablet Take 200 mg by mouth daily. 06/18/22   [provider]  amLODipine (NORVASC) 5 MG tablet Take 1 tablet (5 mg total) by mouth daily. 12/19/21   Wanda Plump, MD  apixaban (ELIQUIS) 2.5 MG TABS tablet Take 1 tablet (2.5 mg total) by mouth 2 (two) times daily. Patient taking differently: Take 2.5 mg by mouth daily. 02/28/22   Arty Baumgartner, NP  clopidogrel (PLAVIX) 75 MG tablet Take 1 tablet (75 mg total) by mouth daily. 03/01/22   Arty Baumgartner, NP  docusate sodium (COLACE) 100 MG capsule Take 1 capsule (100 mg total) by mouth  2 (two) times daily. 07/27/22   Dorcas Carrow, MD  HYDROcodone-acetaminophen (NORCO/VICODIN) 5-325 MG tablet Take 1 tablet by mouth every 6 (six) hours as needed for moderate pain or severe pain. Patient not taking: Reported on 09/30/2022 07/27/22 07/27/23  Dorcas Carrow, MD  metoprolol tartrate (LOPRESSOR) 50 MG tablet Take 1 tablet (50 mg total) by mouth 2 (two) times daily. 12/19/21   Wanda Plump, MD  pantoprazole (PROTONIX) 40 MG tablet Take 1 tablet (40 mg total) by mouth at bedtime. Patient taking differently: Take 40 mg by mouth daily. 02/28/22   Arty Baumgartner, NP  polyethylene glycol (MIRALAX / GLYCOLAX) 17 g packet Take 17 g by mouth daily. 07/27/22   Dorcas Carrow, MD  rosuvastatin (CRESTOR) 20 MG tablet Take 1 tablet (20 mg total) by mouth daily. Patient taking differently: Take 20 mg by mouth at bedtime. 03/01/22   Arty Baumgartner, NP  sertraline (ZOLOFT) 25 MG tablet Take 12.5 mg by mouth at bedtime. 06/18/22   [provider]     Family History  Problem Relation Age of Onset   Breast cancer Mother    Heart disease Father        MIs   Colon cancer Other        cousin   Breast cancer Sister    Leukemia Brother        GM    Social History   Socioeconomic History   Marital status: Widowed    Spouse name: Not on file   Number of children: 3   Years of education: Not on file   Highest education level: Not on file  Occupational History   Occupation: n/a  Tobacco Use   Smoking status: Some Days    Packs/day: 1.00    Years: 37.00    Additional pack years: 0.00    Total pack years: 37.00    Types: Cigarettes    Start date: 06/26/1951   Smokeless tobacco: Never   Tobacco comments:    quit 25 years ago  Vaping Use   Vaping Use: Never used  Substance and Sexual Activity   Alcohol use: Not Currently    Alcohol/week: 1.0 standard drink of alcohol    Types: 1 Glasses of wine per week   Drug use: No   Sexual activity: Not Currently  Other Topics Concern    Not on file  Social History Narrative   Lost husband 07/29/07-    Lives in [place]   has 3 daughters, lost Vernona Rieger [phone])   Moved to ALF 04-2021  [name] lives in [place], [place] ( [place])     Not driving as of [phone]            Social Determinants of Health   Financial Resource Strain: Low Risk  (08/10/2021)   Overall Financial Resource Strain (CARDIA)    Difficulty of Paying Living Expenses: Not hard at all  Food Insecurity: No Food Insecurity (07/24/2022)   Hunger Vital Sign    Worried About Running Out of Food in the Last Year: Never true    Ran Out of Food in the Last Year: Never true  Transportation Needs: No Transportation Needs (07/24/2022)   PRAPARE - Administrator, Civil Service (Medical): No    Lack of Transportation (Non-Medical): No  Physical Activity: Not on file  Stress: Not on file  Social Connections: Not on file     Review of Systems: A 12 point ROS discussed and pertinent positives are indicated in the HPI above.  All other systems are negative.  Review of Systems  Constitutional:  Negative for fatigue and fever.  Respiratory:  Negative for cough and shortness of breath.   Cardiovascular:  Negative for chest pain.  Gastrointestinal:  Negative for abdominal pain.  Musculoskeletal:  Negative for back pain.  Psychiatric/Behavioral:  Negative for behavioral problems and confusion.     Vital Signs: BP 132/70 (BP Location: Right Arm)   Pulse 82   Temp 98.2 F (36.8 C) (Oral)   Resp 18   SpO2 91%   Physical Exam Vitals and nursing note reviewed.  Constitutional:      General: She is not in acute distress.    Appearance: Normal appearance. She is not ill-appearing.  HENT:     Mouth/Throat:     Mouth: Mucous membranes are moist.     Pharynx: Oropharynx is clear.  Cardiovascular:     Rate and Rhythm: Normal rate and regular rhythm.  Pulmonary:     Effort: Pulmonary effort is normal.     Breath sounds: Normal breath sounds.  Abdominal:      General: Abdomen is flat.     Palpations: Abdomen is soft.  Musculoskeletal:        General: Tenderness (pain "between shoulder blades") present.  Skin:    General: Skin is warm and dry.  Neurological:     General: No focal deficit present.     Mental Status: She is alert and oriented to person, place, and time.  Psychiatric:        Mood and Affect: Mood normal.        Behavior: Behavior normal.      MD Evaluation Airway: WNL Heart: WNL Abdomen: WNL Chest/ Lungs: WNL ASA  Classification: 3 Mallampati/Airway Score: Two   Imaging: ECHOCARDIOGRAM LIMITED  Result Date: 09/30/2022    ECHOCARDIOGRAM LIMITED REPORT   Patient Name:   XANIA GANSTER Date of Exam: 09/30/2022 Medical Rec #:  [id number]         Height:       66.0 in Accession #:    [id number]        Weight:       125.7 lb Date of Birth:  04-15-1930         BSA:          1.641 m Patient Age:    89 years          BP:           145/65 mmHg Patient Gender: F  HR:           68 bpm. Exam Location:  Inpatient Procedure: Limited Echo, Cardiac Doppler and Color Doppler Indications:    R01.1 Murmur  History:        Patient has prior history of Echocardiogram examinations, most                 recent 02/24/2022. Cardiomyopathy, CAD, Stroke; Risk                 Factors:Hypertension and Dyslipidemia. SDH. Takotsubo.                 Asymmetric LVH.  Sonographer:    Sheralyn Boatman RDCS Referring Phys: [id number] [facility] M PATEL  Sonographer Comments: Technically difficult study due to poor echo windows. Patient had neck brace in place. Suboptimal parasternal. Patient very sensitive to pressure from probe. IMPRESSIONS  1. No sig LVOT obstruction. Basal/septal hypertrophy can be seen with old age. Unchanged hypokinesis of the inferior/inferolateral/inferoseptal base-mid wall. Left ventricular ejection fraction, by estimation, is 50 to 55%. The left ventricle has low normal function. There is severe asymmetric left ventricular hypertrophy of  the basal-septal segment. Left ventricular diastolic parameters are consistent with Grade I diastolic dysfunction (impaired relaxation).  2. No evidence of mitral valve regurgitation.  3. There is moderate calcification of the aortic valve. Aortic valve regurgitation is not visualized. Mild aortic valve stenosis.  4. The inferior vena cava is normal in size with greater than 50% respiratory variability, suggesting right atrial pressure of 3 mmHg. Comparison(s): No significant change from prior study. FINDINGS  Left Ventricle: No sig LVOT obstruction. Basal/septal hypertrophy can be seen with old age. Unchanged hypokinesis of the inferior/inferolateral/inferoseptal base-mid wall. Left ventricular ejection fraction, by estimation, is 50 to 55%. The left ventricle has low normal function. There is severe asymmetric left ventricular hypertrophy of the basal-septal segment. Left ventricular diastolic parameters are consistent with Grade I diastolic dysfunction (impaired relaxation). Mitral Valve: Mild mitral annular calcification. Tricuspid Valve: Tricuspid valve regurgitation is trivial. Aortic Valve: There is moderate calcification of the aortic valve. Aortic valve regurgitation is not visualized. Mild aortic stenosis is present. Aortic valve mean gradient measures 12.0 mmHg. Aortic valve peak gradient measures 22.1 mmHg. Aortic valve area, by VTI measures 2.43 cm. Aorta: The aortic root and ascending aorta are structurally normal, with no evidence of dilitation. Venous: The inferior vena cava is normal in size with greater than 50% respiratory variability, suggesting right atrial pressure of 3 mmHg. Additional Comments: Spectral Doppler performed. Color Doppler performed.  LEFT VENTRICLE PLAX 2D LVIDd:         3.20 cm LVIDs:         2.30 cm LV PW:         1.10 cm LV IVS:        1.10 cm LVOT diam:     2.20 cm   3D Volume EF: LV SV:         127       3D EF:        55 % LV SV Index:   77        LV EDV:       89 ml LVOT  Area:     3.80 cm  LV ESV:       40 ml                          LV SV:  49 ml IVC IVC diam: 2.20 cm LEFT ATRIUM         Index LA diam:    5.00 cm 3.05 cm/m  AORTIC VALVE AV Area (Vmax):    2.31 cm AV Area (Vmean):   2.35 cm AV Area (VTI):     2.43 cm AV Vmax:           235.00 cm/s AV Vmean:          162.000 cm/s AV VTI:            0.520 m AV Peak Grad:      22.1 mmHg AV Mean Grad:      12.0 mmHg LVOT Vmax:         143.00 cm/s LVOT Vmean:        100.000 cm/s LVOT VTI:          0.333 m LVOT/AV VTI ratio: 0.64  AORTA Ao Root diam: 3.05 cm Ao Asc diam:  2.70 cm MITRAL VALVE MV Area (PHT): 2.69 cm     SHUNTS MV Decel Time: 282 msec     Systemic VTI:  0.33 m MV E velocity: 80.20 cm/s   Systemic Diam: 2.20 cm MV A velocity: 104.00 cm/s MV E/A ratio:  0.77 [name] signed by Carolan Clines Signature Date/Time: 09/30/2022/11:08:46 AM    Final    CT HEAD WO CONTRAST ( )  Result Date: 09/29/2022 CLINICAL DATA:  89 year old female with falls, small right para falcine subdural hematoma. EXAM: CT HEAD WITHOUT CONTRAST TECHNIQUE: Contiguous axial images were obtained from the base of the skull through the vertex without intravenous contrast. RADIATION DOSE REDUCTION: This exam was performed according to the departmental dose-optimization program which includes automated exposure control, adjustment of the mA and/or kV according to patient size and/or use of iterative reconstruction technique. COMPARISON:  Head CT yesterday, 07/24/2022. FINDINGS: Brain: Trace right para falcine subdural blood on series 4, image 27 is unchanged. No new intracranial hemorrhage identified. No midline shift, mass effect, or evidence of intracranial mass lesion. No IVH or ventriculomegaly. Chronic encephalomalacia in the right inferior frontal gyrus, right inferior temporal gyrus. Chronic lacunar infarct of the right thalamus. Patchy and confluent bilateral cerebral white matter hypodensity. Small chronic left cerebellar  infarct. No cortically based acute infarct identified. Vascular: Calcified atherosclerosis at the skull base. Intracranial artery tortuosity. No suspicious intracranial vascular hyperdensity. Skull: Stable.  No acute fracture identified. Sinuses/Orbits: Visualized paranasal sinuses and mastoids are stable and well aerated. Other: No discrete orbit or scalp soft tissue injury. IMPRESSION: 1. Trace right para falcine subdural blood is stable with no intracranial mass effect. 2. No new intracranial abnormality. 3. Chronic ischemic disease, and ischemic versus posttraumatic right frontotemporal encephalomalacia. Electronically Signed   By: Odessa Fleming M.D.   On: 09/29/2022 06:39   CT HEAD WO CONTRAST  Addendum Date: 09/29/2022   ADDENDUM REPORT: 09/29/2022 01:23 ADDENDUM: In retrospect the T7 compression fracture described above, does appear to have been present on thoracic spine plain films from 02/19/2022. There is increased sclerosis of the vertebral body compatible with an interval healing response, but the degree of compression and retropulsion seems unchanged. Electronically Signed   By: Almira Bar M.D.   On: 09/29/2022 01:23   Result Date: 09/29/2022 CLINICAL DATA:  Fall trauma with head trauma, additional blunt poly frequent falls. Trauma. EXAM: CT HEAD WITHOUT CONTRAST CT CERVICAL SPINE WITHOUT CONTRAST CT CHEST, ABDOMEN AND PELVIS WITHOUT CONTRAST TECHNIQUE: Contiguous axial images were obtained from the base  of the skull through the vertex without intravenous contrast. Multidetector CT imaging of the cervical spine was performed without intravenous contrast. Multiplanar CT image reconstructions were also generated. Multidetector CT imaging of the chest, abdomen and pelvis was performed following the standard protocol without IV contrast. RADIATION DOSE REDUCTION: This exam was performed according to the departmental dose-optimization program which includes automated exposure control, adjustment of the  mA and/or kV according to patient size and/or use of iterative reconstruction technique. COMPARISON:  Head and cervical spine CT both obtained recently 09/24/2022, CTA chest, abdomen and pelvis 10/01/2021, abdomen and pelvis CT 10/03/2020. FINDINGS: CT HEAD FINDINGS Brain: Newly noted is a small acute 3 mm thick right parafalcine subdural bleed in the high frontal area. There is no further evidence of intracranial hemorrhage. There is cerebral atrophy with mild atrophic ventriculomegaly, chronic inferior right frontal infarct, and extensive and fairly confluent hypoattenuating small-vessel disease of the cerebral white matter. There is a chronic right thalamic lacunar infarct and a small chronic infarct anteriorly in the left cerebellar hemisphere. No new cortical based infarct is seen, no mass effect or midline shift. Basal cisterns are clear. Vascular: There is calcification distal right vertebral artery and both siphons. No hyperdense central vessel is seen. Skull: There is osteopenia without evidence for depressed fractures. No scalp hematoma is seen. Sinuses/Orbits: Negative orbits. Clear paranasal sinuses with mild septal deviation to the right. Fluid again noted the bilateral mastoid tips, left-greater-than-right, with otherwise clear mastoids. Other: None. CT CERVICAL FINDINGS Alignment: Unchanged. No traumatic listhesis is suspected. A slight degenerative listhesis is again noted at C4-5 and C7-T1. Bone-on-bone anterior atlantodental joint space loss and osteophytosis is again shown. There is no C1-2 offset. Skull base and vertebrae: Generalized osteopenia. There is a chronic mild upper plate anterior wedge compression fracture of the T1 vertebral body. This appears unchanged. At C7, new from 09/24/2022 there is a slight loss of vertebral height of about 15% and mild anterior bulge newly seen in the contour of the anterior vertebral body, findings consistent with a new mild compression fracture. At T3, new  since 10/01/2021 there is a compression fracture with up to 30-40% generalized vertebral height loss and mild fragmentation, slight outward spreading of fragments and retropulsion, without cord compromise. Kyphoplasty cement and partial vertebral height loss are unchanged at T4. Soft tissues and spinal canal: There is edema and swelling around the new T3 compression fracture but no notable swelling at C7. There is no definitive spinal canal hematoma. Soft tissue resolution in the thoracic spine is less than in the cervical spine due to patient's overlying arms. There is moderate calcification in both proximal cervical ICAs. No laryngeal mass or thyroid nodules. Disc levels: Preservation of disc heights except for mild disc space loss at C5-6. There are mild facet joint and uncinate spurring changes at most levels, greater on the left, moderate left foraminal stenosis again noted C3-4, C4-5 and C6-7. No other significant foraminal stenosis. Other: None. CT CHEST FINDINGS Cardiovascular: Mild cardiomegaly. There are three-vessel coronary artery calcifications, aortic tortuosity, and moderate calcific plaques of the aorta and great vessels without aneurysm. Pulmonary arteries and veins appear normal in caliber. Mediastinum/Nodes: Calcified mediastinal and right hilar nodes. No adenopathy is seen without contrast. Moderate hiatal hernia is unchanged. Lungs/Pleura: Small layering pleural effusions new from prior study. No pneumothorax. The main bronchi are clear. There are scarring changes in the apices and bases increased atelectatic change in the left lower lobe versus consolidation. There is diffuse bronchial thickening. No  nodules or further new findings. Musculoskeletal: Generalized osteopenia. There is a chronic fracture of the proximal right humerus. There are multiple bilateral chronic healed rib fracture deformities without acute displaced rib fracture. Rudimentary C7 cervical ribs.  Again noted absence of the  T12 ribs. Kyphoplasty cement is again noted in the T4 T8, T9, T10 and T12 vertebrae. In addition to the new compression fracture described above at T3 there is also a new moderate to severe compression fracture of the T7 vertebral body, with 75-80% loss of the vertebral height in general, with about 4 mm of posteroinferior cortical retropulsion. This mildly narrows the thecal sac eccentric to the left to 6.3 mm AP. Unchanged treated moderate wedging T8, T9, T10 and T12. No definitive evidence of a canal hematoma but there is increased soft tissue fullness alongside T3 and T7. CT ABDOMEN AND PELVIS FINDINGS Hepatobiliary: Increasingly dense liver measuring 20.6 cm length. Clinical correlation advised for amiodarone or heavy metal toxicity versus iron deposition. There are scattered calcified granulomas without visible mass. Gallbladder is absent with no biliary dilatation. Pancreas: No abnormality. Spleen: Costophrenic granulomas. Otherwise no further abnormality. No adjacent hematoma. Adrenals/Urinary Tract: No significant findings. No calculus. No bladder thickening or contour deforming mass of the kidneys. Stomach/Bowel: No dilatation or wall thickening. Moderate fecal stasis. Diverticulosis without diverticulitis. Vascular/Lymphatic: Heavy aortoiliac calcific plaques with visceral branch vessel atherosclerosis. No adenopathy is seen. Reproductive: Small calcified uterine fibroid. No other significant findings. Other: Mild generalized body wall edema similar. Trace presacral ascites is seen and mild mesenteric congestive features, also similar. There is no free hemorrhage or free air. Musculoskeletal: Since 10/01/2021, interval right hip nailing. There are healed pubic rami fractures. Osteopenia. Four lumbar type vertebrae with chronic wedging and kyphoplasty L1. Mild biconcave appearance of L2 and mild upper plate concavity at L3. Normal height of L5. All unchanged. No regional acute skeletal fracture is seen.  IMPRESSION: 1. 3 mm acute small right parafalcine subdural bleed in the high frontal area. No mass effect or midline shift. 2. Osteopenia and degenerative change of the cervical spine without traumatic listhesis. 3. New T3 compression fracture with 30-40% loss of the vertebral height and mild fragmentation, with slight outward spreading of fragments and mild retropulsion. No cord compromise but there is mild narrowing of the thecal sac. 4. New C7 mild compression fracture with loss of vertebral height of about 15% and mild anterior bulge of the anterior vertebral body. 5. No definitive canal hematoma but there is increased soft tissue fullness alongside T3 and T7. MRI follow-up recommended. Multilevel old kyphoplasty. 6. Small pleural effusions with increased atelectatic change versus consolidation in the left lower lobe. Diffuse bronchitis. 7. Cardiomegaly with calcific CAD, aortic and branch vessel atherosclerosis. 8. Increasingly dense liver. Clinical correlation advised for amiodarone or heavy metal toxicity versus iron deposition. 9. No acute trauma related findings in the abdomen or pelvis. 10. Chronically noted mesenteric congestion and mild body wall edema. Trace ascites. 11. Constipation and diverticulosis. 12. Interval right hip nailing since last year. 13. Critical Value/emergent results were called by telephone at the time of interpretation on 09/28/2022 at 11:49 pm to provider Dr. Madilyn Hook, who verbally acknowledged these results. Aortic Atherosclerosis (ICD10-I70.0). Electronically Signed: By: Almira Bar M.D. On: 09/29/2022 00:36   CT CERVICAL SPINE WO CONTRAST  Addendum Date: 09/29/2022   ADDENDUM REPORT: 09/29/2022 01:23 ADDENDUM: In retrospect the T7 compression fracture described above, does appear to have been present on thoracic spine plain films from 02/19/2022. There is increased sclerosis  of the vertebral body compatible with an interval healing response, but the degree of compression and  retropulsion seems unchanged. Electronically Signed   By: Almira Bar M.D.   On: 09/29/2022 01:23   Result Date: 09/29/2022 CLINICAL DATA:  Fall trauma with head trauma, additional blunt poly frequent falls. Trauma. EXAM: CT HEAD WITHOUT CONTRAST CT CERVICAL SPINE WITHOUT CONTRAST CT CHEST, ABDOMEN AND PELVIS WITHOUT CONTRAST TECHNIQUE: Contiguous axial images were obtained from the base of the skull through the vertex without intravenous contrast. Multidetector CT imaging of the cervical spine was performed without intravenous contrast. Multiplanar CT image reconstructions were also generated. Multidetector CT imaging of the chest, abdomen and pelvis was performed following the standard protocol without IV contrast. RADIATION DOSE REDUCTION: This exam was performed according to the departmental dose-optimization program which includes automated exposure control, adjustment of the mA and/or kV according to patient size and/or use of iterative reconstruction technique. COMPARISON:  Head and cervical spine CT both obtained recently 09/24/2022, CTA chest, abdomen and pelvis 10/01/2021, abdomen and pelvis CT 10/03/2020. FINDINGS: CT HEAD FINDINGS Brain: Newly noted is a small acute 3 mm thick right parafalcine subdural bleed in the high frontal area. There is no further evidence of intracranial hemorrhage. There is cerebral atrophy with mild atrophic ventriculomegaly, chronic inferior right frontal infarct, and extensive and fairly confluent hypoattenuating small-vessel disease of the cerebral white matter. There is a chronic right thalamic lacunar infarct and a small chronic infarct anteriorly in the left cerebellar hemisphere. No new cortical based infarct is seen, no mass effect or midline shift. Basal cisterns are clear. Vascular: There is calcification distal right vertebral artery and both siphons. No hyperdense central vessel is seen. Skull: There is osteopenia without evidence for depressed fractures. No  scalp hematoma is seen. Sinuses/Orbits: Negative orbits. Clear paranasal sinuses with mild septal deviation to the right. Fluid again noted the bilateral mastoid tips, left-greater-than-right, with otherwise clear mastoids. Other: None. CT CERVICAL FINDINGS Alignment: Unchanged. No traumatic listhesis is suspected. A slight degenerative listhesis is again noted at C4-5 and C7-T1. Bone-on-bone anterior atlantodental joint space loss and osteophytosis is again shown. There is no C1-2 offset. Skull base and vertebrae: Generalized osteopenia. There is a chronic mild upper plate anterior wedge compression fracture of the T1 vertebral body. This appears unchanged. At C7, new from 09/24/2022 there is a slight loss of vertebral height of about 15% and mild anterior bulge newly seen in the contour of the anterior vertebral body, findings consistent with a new mild compression fracture. At T3, new since 10/01/2021 there is a compression fracture with up to 30-40% generalized vertebral height loss and mild fragmentation, slight outward spreading of fragments and retropulsion, without cord compromise. Kyphoplasty cement and partial vertebral height loss are unchanged at T4. Soft tissues and spinal canal: There is edema and swelling around the new T3 compression fracture but no notable swelling at C7. There is no definitive spinal canal hematoma. Soft tissue resolution in the thoracic spine is less than in the cervical spine due to patient's overlying arms. There is moderate calcification in both proximal cervical ICAs. No laryngeal mass or thyroid nodules. Disc levels: Preservation of disc heights except for mild disc space loss at C5-6. There are mild facet joint and uncinate spurring changes at most levels, greater on the left, moderate left foraminal stenosis again noted C3-4, C4-5 and C6-7. No other significant foraminal stenosis. Other: None. CT CHEST FINDINGS Cardiovascular: Mild cardiomegaly. There are three-vessel  coronary artery calcifications, aortic tortuosity,  and moderate calcific plaques of the aorta and great vessels without aneurysm. Pulmonary arteries and veins appear normal in caliber. Mediastinum/Nodes: Calcified mediastinal and right hilar nodes. No adenopathy is seen without contrast. Moderate hiatal hernia is unchanged. Lungs/Pleura: Small layering pleural effusions new from prior study. No pneumothorax. The main bronchi are clear. There are scarring changes in the apices and bases increased atelectatic change in the left lower lobe versus consolidation. There is diffuse bronchial thickening. No nodules or further new findings. Musculoskeletal: Generalized osteopenia. There is a chronic fracture of the proximal right humerus. There are multiple bilateral chronic healed rib fracture deformities without acute displaced rib fracture. Rudimentary C7 cervical ribs.  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 Kyphoplasty cement is again noted in the T4 T8, T9, T10 and T12 vertebrae. In addition to the new compression fracture described above at T3 there is also a new moderate to severe compression fracture of the T7 vertebral body, with 75-80% loss of the vertebral height in general, with about 4 mm of posteroinferior cortical retropulsion. This mildly narrows the thecal sac eccentric to the left to 6.3 mm AP. Unchanged treated moderate wedging T8, T9, T10 and T12. No definitive evidence of a canal hematoma but there is increased soft tissue fullness alongside T3 and T7. CT ABDOMEN AND PELVIS FINDINGS Hepatobiliary: Increasingly dense liver measuring 20.6 cm length. Clinical correlation advised for amiodarone or heavy metal toxicity versus iron deposition. There are scattered calcified granulomas without visible mass. Gallbladder is absent with no biliary dilatation. Pancreas: No abnormality. Spleen: Costophrenic granulomas. Otherwise no further abnormality. No adjacent hematoma. Adrenals/Urinary Tract: No significant  findings. No calculus. No bladder thickening or contour deforming mass of the kidneys. Stomach/Bowel: No dilatation or wall thickening. Moderate fecal stasis. Diverticulosis without diverticulitis. Vascular/Lymphatic: Heavy aortoiliac calcific plaques with visceral branch vessel atherosclerosis. No adenopathy is seen. Reproductive: Small calcified uterine fibroid. No other significant findings. Other: Mild generalized body wall edema similar. Trace presacral ascites is seen and mild mesenteric congestive features, also similar. There is no free hemorrhage or free air. Musculoskeletal: Since 10/01/2021, interval right hip nailing. There are healed pubic rami fractures. Osteopenia. Four lumbar type vertebrae with chronic wedging and kyphoplasty L1. Mild biconcave appearance of L2 and mild upper plate concavity at L3. Normal height of L5. All unchanged. No regional acute skeletal fracture is seen. IMPRESSION: 1. 3 mm acute small right parafalcine subdural bleed in the high frontal area. No mass effect or midline shift. 2. Osteopenia and degenerative change of the cervical spine without traumatic listhesis. 3. New T3 compression fracture with 30-40% loss of the vertebral height and mild fragmentation, with slight outward spreading of fragments and mild retropulsion. No cord compromise but there is mild narrowing of the thecal sac. 4. New C7 mild compression fracture with loss of vertebral height of about 15% and mild anterior bulge of the anterior vertebral body. 5. No definitive canal hematoma but there is increased soft tissue fullness alongside T3 and T7. MRI follow-up recommended. Multilevel old kyphoplasty. 6. Small pleural effusions with increased atelectatic change versus consolidation in the left lower lobe. Diffuse bronchitis. 7. Cardiomegaly with calcific CAD, aortic and branch vessel atherosclerosis. 8. Increasingly dense liver. Clinical correlation advised for amiodarone or heavy metal toxicity versus iron  deposition. 9. No acute trauma related findings in the abdomen or pelvis. 10. Chronically noted mesenteric congestion and mild body wall edema. Trace ascites. 11. Constipation and diverticulosis. 12. Interval right hip nailing since last year. 13. Critical Value/emergent results  were called by telephone at the time of interpretation on 09/28/2022 at 11:49 pm to provider Dr. Madilyn Hook, who verbally acknowledged these results. Aortic Atherosclerosis (ICD10-I70.0). Electronically Signed: By: Almira Bar M.D. On: 09/29/2022 00:36   CT CHEST ABDOMEN PELVIS WO CONTRAST  Addendum Date: 09/29/2022   ADDENDUM REPORT: 09/29/2022 01:23 ADDENDUM: In retrospect the T7 compression fracture described above, does appear to have been present on thoracic spine plain films from 02/19/2022. There is increased sclerosis of the vertebral body compatible with an interval healing response, but the degree of compression and retropulsion seems unchanged. Electronically Signed   By: Almira Bar M.D.   On: 09/29/2022 01:23   Result Date: 09/29/2022 CLINICAL DATA:  Fall trauma with head trauma, additional blunt poly frequent falls. Trauma. EXAM: CT HEAD WITHOUT CONTRAST CT CERVICAL SPINE WITHOUT CONTRAST CT CHEST, ABDOMEN AND PELVIS WITHOUT CONTRAST TECHNIQUE: Contiguous axial images were obtained from the base of the skull through the vertex without intravenous contrast. Multidetector CT imaging of the cervical spine was performed without intravenous contrast. Multiplanar CT image reconstructions were also generated. Multidetector CT imaging of the chest, abdomen and pelvis was performed following the standard protocol without IV contrast. RADIATION DOSE REDUCTION: This exam was performed according to the departmental dose-optimization program which includes automated exposure control, adjustment of the mA and/or kV according to patient size and/or use of iterative reconstruction technique. COMPARISON:  Head and cervical spine CT both  obtained recently 09/24/2022, CTA chest, abdomen and pelvis 10/01/2021, abdomen and pelvis CT 10/03/2020. FINDINGS: CT HEAD FINDINGS Brain: Newly noted is a small acute 3 mm thick right parafalcine subdural bleed in the high frontal area. There is no further evidence of intracranial hemorrhage. There is cerebral atrophy with mild atrophic ventriculomegaly, chronic inferior right frontal infarct, and extensive and fairly confluent hypoattenuating small-vessel disease of the cerebral white matter. There is a chronic right thalamic lacunar infarct and a small chronic infarct anteriorly in the left cerebellar hemisphere. No new cortical based infarct is seen, no mass effect or midline shift. Basal cisterns are clear. Vascular: There is calcification distal right vertebral artery and both siphons. No hyperdense central vessel is seen. Skull: There is osteopenia without evidence for depressed fractures. No scalp hematoma is seen. Sinuses/Orbits: Negative orbits. Clear paranasal sinuses with mild septal deviation to the right. Fluid again noted the bilateral mastoid tips, left-greater-than-right, with otherwise clear mastoids. Other: None. CT CERVICAL FINDINGS Alignment: Unchanged. No traumatic listhesis is suspected. A slight degenerative listhesis is again noted at C4-5 and C7-T1. Bone-on-bone anterior atlantodental joint space loss and osteophytosis is again shown. There is no C1-2 offset. Skull base and vertebrae: Generalized osteopenia. There is a chronic mild upper plate anterior wedge compression fracture of the T1 vertebral body. This appears unchanged. At C7, new from 09/24/2022 there is a slight loss of vertebral height of about 15% and mild anterior bulge newly seen in the contour of the anterior vertebral body, findings consistent with a new mild compression fracture. At T3, new since 10/01/2021 there is a compression fracture with up to 30-40% generalized vertebral height loss and mild fragmentation, slight  outward spreading of fragments and retropulsion, without cord compromise. Kyphoplasty cement and partial vertebral height loss are unchanged at T4. Soft tissues and spinal canal: There is edema and swelling around the new T3 compression fracture but no notable swelling at C7. There is no definitive spinal canal hematoma. Soft tissue resolution in the thoracic spine is less than in the cervical spine due to  patient's overlying arms. There is moderate calcification in both proximal cervical ICAs. No laryngeal mass or thyroid nodules. Disc levels: Preservation of disc heights except for mild disc space loss at C5-6. There are mild facet joint and uncinate spurring changes at most levels, greater on the left, moderate left foraminal stenosis again noted C3-4, C4-5 and C6-7. No other significant foraminal stenosis. Other: None. CT CHEST FINDINGS Cardiovascular: Mild cardiomegaly. There are three-vessel coronary artery calcifications, aortic tortuosity, and moderate calcific plaques of the aorta and great vessels without aneurysm. Pulmonary arteries and veins appear normal in caliber. Mediastinum/Nodes: Calcified mediastinal and right hilar nodes. No adenopathy is seen without contrast. Moderate hiatal hernia is unchanged. Lungs/Pleura: Small layering pleural effusions new from prior study. No pneumothorax. The main bronchi are clear. There are scarring changes in the apices and bases increased atelectatic change in the left lower lobe versus consolidation. There is diffuse bronchial thickening. No nodules or further new findings. Musculoskeletal: Generalized osteopenia. There is a chronic fracture of the proximal right humerus. There are multiple bilateral chronic healed rib fracture deformities without acute displaced rib fracture. Rudimentary C7 cervical ribs.  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 Kyphoplasty cement is again noted in the T4 T8, T9, T10 and T12 vertebrae. In addition to the new compression fracture  described above at T3 there is also a new moderate to severe compression fracture of the T7 vertebral body, with 75-80% loss of the vertebral height in general, with about 4 mm of posteroinferior cortical retropulsion. This mildly narrows the thecal sac eccentric to the left to 6.3 mm AP. Unchanged treated moderate wedging T8, T9, T10 and T12. No definitive evidence of a canal hematoma but there is increased soft tissue fullness alongside T3 and T7. CT ABDOMEN AND PELVIS FINDINGS Hepatobiliary: Increasingly dense liver measuring 20.6 cm length. Clinical correlation advised for amiodarone or heavy metal toxicity versus iron deposition. There are scattered calcified granulomas without visible mass. Gallbladder is absent with no biliary dilatation. Pancreas: No abnormality. Spleen: Costophrenic granulomas. Otherwise no further abnormality. No adjacent hematoma. Adrenals/Urinary Tract: No significant findings. No calculus. No bladder thickening or contour deforming mass of the kidneys. Stomach/Bowel: No dilatation or wall thickening. Moderate fecal stasis. Diverticulosis without diverticulitis. Vascular/Lymphatic: Heavy aortoiliac calcific plaques with visceral branch vessel atherosclerosis. No adenopathy is seen. Reproductive: Small calcified uterine fibroid. No other significant findings. Other: Mild generalized body wall edema similar. Trace presacral ascites is seen and mild mesenteric congestive features, also similar. There is no free hemorrhage or free air. Musculoskeletal: Since 10/01/2021, interval right hip nailing. There are healed pubic rami fractures. Osteopenia. Four lumbar type vertebrae with chronic wedging and kyphoplasty L1. Mild biconcave appearance of L2 and mild upper plate concavity at L3. Normal height of L5. All unchanged. No regional acute skeletal fracture is seen. IMPRESSION: 1. 3 mm acute small right parafalcine subdural bleed in the high frontal area. No mass effect or midline shift. 2.  Osteopenia and degenerative change of the cervical spine without traumatic listhesis. 3. New T3 compression fracture with 30-40% loss of the vertebral height and mild fragmentation, with slight outward spreading of fragments and mild retropulsion. No cord compromise but there is mild narrowing of the thecal sac. 4. New C7 mild compression fracture with loss of vertebral height of about 15% and mild anterior bulge of the anterior vertebral body. 5. No definitive canal hematoma but there is increased soft tissue fullness alongside T3 and T7. MRI follow-up recommended. Multilevel old kyphoplasty. 6. Small pleural  effusions with increased atelectatic change versus consolidation in the left lower lobe. Diffuse bronchitis. 7. Cardiomegaly with calcific CAD, aortic and branch vessel atherosclerosis. 8. Increasingly dense liver. Clinical correlation advised for amiodarone or heavy metal toxicity versus iron deposition. 9. No acute trauma related findings in the abdomen or pelvis. 10. Chronically noted mesenteric congestion and mild body wall edema. Trace ascites. 11. Constipation and diverticulosis. 12. Interval right hip nailing since last year. 13. Critical Value/emergent results were called by telephone at the time of interpretation on 09/28/2022 at 11:49 pm to provider Dr. Madilyn Hook, who verbally acknowledged these results. Aortic Atherosclerosis (ICD10-I70.0). Electronically Signed: By: Almira Bar M.D. On: 09/29/2022 00:36   CT L-SPINE NO CHARGE  Result Date: 09/29/2022 CLINICAL DATA:  Back trauma.  Level 2 fall on blood thinners. EXAM: CT THORACIC AND LUMBAR SPINE WITHOUT CONTRAST TECHNIQUE: Multidetector CT imaging of the thoracic and lumbar spine was performed without contrast. Multiplanar CT image reconstructions were also generated. RADIATION DOSE REDUCTION: This exam was performed according to the departmental dose-optimization program which includes automated exposure control, adjustment of the mA and/or kV  according to patient size and/or use of iterative reconstruction technique. COMPARISON:  Thoracic spine radiographs 02/19/2022 and CT thoracic and lumbar spine 10/01/2021 FINDINGS: CT THORACIC SPINE FINDINGS Alignment: No evidence of traumatic malalignment. Accentuated thoracic kyphosis centered at T7-T8. Vertebrae: Status post vertebroplasty at T4, T8, T9, and T12. There is new superior endplate compression of T3 when compared with 02/19/2022 with mildly displaced right anterior superior corner fracture (series 3/image [id number]). Unchanged superior endplate compression of T1 where it is mild, T7 where it is advanced with unchanged retropulsion of the posterior endplate. Extrusion vertebroplasty material through the superior endplate of T12 with indentation of the inferior endplate of T11, unchanged from prior radiograph. Paraspinal and other soft tissues: Unremarkable. See separate report for findings in the chest, abdomen, and pelvis. Disc levels: Multilevel spondylosis, disc space height loss, and degenerative endplate changes. Osteopenia. No severe spinal canal narrowing. Retropulsion of the posterior vertebral body walls at T3, T7, T8, T9, T10, and T12 cause up to mild effacement of the ventral thecal sac. CT LUMBAR SPINE FINDINGS Segmentation: Transitional anatomy. Absent ribs at T12. Fully sacralized L5. Careful attention to numbering is recommended prior to any intervention. Alignment: No evidence of traumatic malalignment. Vertebrae: L1 vertebroplasty. Superior endplate compression fractures are unchanged in L2 and L3. No evidence of acute fracture. Paraspinal and other soft tissues: Negative. See separate report for findings in the abdomen and pelvis. Disc levels: Multilevel spondylosis, disc space height loss, degenerative endplate changes with vacuum phenomenon. Multilevel moderate-advanced facet arthropathy. Inferior endplate retropulsion at L1 causes mild effacement of the ventral thecal sac. No high-grade  spinal canal. IMPRESSION: CT THORACIC SPINE IMPRESSION. 1. Age indeterminate superior endplate compression fracture of T3 with 40% vertebral body height loss and mildly displaced right anterior superior corner fracture. This is new since thoracic spine radiographs 02/19/2022 and may be acute. CT LUMBAR SPINE IMPRESSION. 1. No acute fracture in the lumbar spine. Unchanged superior endplate compression fractures at L2 and L3. 2. Transitional anatomy with complete sacralized L5. Careful attention to numbering is recommended prior to any intervention. Same day CT chest, abdomen, pelvis reported separately. Electronically Signed   By: Minerva Fester M.D.   On: 09/29/2022 00:05   CT T-SPINE NO CHARGE  Result Date: 09/29/2022 CLINICAL DATA:  Back trauma.  Level 2 fall on blood thinners. EXAM: CT THORACIC AND LUMBAR SPINE WITHOUT CONTRAST TECHNIQUE: Multidetector CT  imaging of the thoracic and lumbar spine was performed without contrast. Multiplanar CT image reconstructions were also generated. RADIATION DOSE REDUCTION: This exam was performed according to the departmental dose-optimization program which includes automated exposure control, adjustment of the mA and/or kV according to patient size and/or use of iterative reconstruction technique. COMPARISON:  Thoracic spine radiographs 02/19/2022 and CT thoracic and lumbar spine 10/01/2021 FINDINGS: CT THORACIC SPINE FINDINGS Alignment: No evidence of traumatic malalignment. Accentuated thoracic kyphosis centered at T7-T8. Vertebrae: Status post vertebroplasty at T4, T8, T9, and T12. There is new superior endplate compression of T3 when compared with 02/19/2022 with mildly displaced right anterior superior corner fracture (series 3/image 57). Unchanged superior endplate compression of T1 where it is mild, T7 where it is advanced with unchanged retropulsion of the posterior endplate. Extrusion vertebroplasty material through the superior endplate of T12 with indentation  of the inferior endplate of T11, unchanged from prior radiograph. Paraspinal and other soft tissues: Unremarkable. See separate report for findings in the chest, abdomen, and pelvis. Disc levels: Multilevel spondylosis, disc space height loss, and degenerative endplate changes. Osteopenia. No severe spinal canal narrowing. Retropulsion of the posterior vertebral body walls at T3, T7, T8, T9, T10, and T12 cause up to mild effacement of the ventral thecal sac. CT LUMBAR SPINE FINDINGS Segmentation: Transitional anatomy. Absent ribs at T12. Fully sacralized L5. Careful attention to numbering is recommended prior to any intervention. Alignment: No evidence of traumatic malalignment. Vertebrae: L1 vertebroplasty. Superior endplate compression fractures are unchanged in L2 and L3. No evidence of acute fracture. Paraspinal and other soft tissues: Negative. See separate report for findings in the abdomen and pelvis. Disc levels: Multilevel spondylosis, disc space height loss, degenerative endplate changes with vacuum phenomenon. Multilevel moderate-advanced facet arthropathy. Inferior endplate retropulsion at L1 causes mild effacement of the ventral thecal sac. No high-grade spinal canal. IMPRESSION: CT THORACIC SPINE IMPRESSION. 1. Age indeterminate superior endplate compression fracture of T3 with 40% vertebral body height loss and mildly displaced right anterior superior corner fracture. This is new since thoracic spine radiographs 02/19/2022 and may be acute. CT LUMBAR SPINE IMPRESSION. 1. No acute fracture in the lumbar spine. Unchanged superior endplate compression fractures at L2 and L3. 2. Transitional anatomy with complete sacralized L5. Careful attention to numbering is recommended prior to any intervention. Same day CT chest, abdomen, pelvis reported separately. Electronically Signed   By: Minerva Fester M.D.   On: 09/29/2022 00:05   DG Pelvis Portable  Result Date: 09/28/2022 CLINICAL DATA:  Trauma fall  EXAM: PORTABLE PELVIS 1-2 VIEWS COMPARISON:  05/02/2021, 07/23/2022 FINDINGS: SI joints are non widened. Pubic symphysis appears intact. Old appearing left inferior pubic ramus fracture. Remote left femoral orthopedic hardware. Interval intramedullary rodding of comminuted right intertrochanteric fracture. No definite acute osseous abnormality. IMPRESSION: 1. No acute osseous abnormality. 2. Interval intramedullary rodding of comminuted right intertrochanteric fracture. Electronically Signed   By: Jasmine Pang M.D.   On: 09/28/2022 23:19   DG Chest Port 1 View  Result Date: 09/28/2022 CLINICAL DATA:  Trauma fall EXAM: PORTABLE CHEST 1 VIEW COMPARISON:  07/23/2022 FINDINGS: Mild diffuse interstitial opacity likely chronic disease. Negative for pneumothorax. Healing right proximal humerus fracture with callus. Multiple treated vertebral compression deformities. Multiple remote right rib fractures. Hiatal hernia contributing to retrocardiac opacity. IMPRESSION: 1. Negative for pneumothorax or pleural effusion. 2. Healing right proximal humerus fracture with callus. 3. Hiatal hernia. Electronically Signed   By: Jasmine Pang M.D.   On: 09/28/2022 23:18  Labs:  CBC: Recent Labs    09/28/22 2256 09/28/22 2257 09/29/22 0215 09/29/22 0219 09/30/22 0445 10/01/22 0248  WBC 13.6*  --   --  14.3* 10.9* 8.8  HGB 12.7   < > 13.6 11.9* 12.4 11.9*  HCT 42.0   < > 40.0 40.1 41.5 39.8  PLT 428*  --   --  398 390 363   < > = values in this interval not displayed.    COAGS: Recent Labs    02/23/22 1507 09/28/22 2256  INR 1.1 1.4*  APTT 34  --     BMP: Recent Labs    09/29/22 0219 09/29/22 1102 09/30/22 0445 10/01/22 0248  NA 135 134* 131* 131*  K 2.9* 3.7 3.5 3.4*  CL 96* 96* 94* 94*  CO2 27 30 28 30   GLUCOSE 160* 153* 130* 113*  BUN 11 10 11 11   CALCIUM 8.5* 8.4* 8.1* 7.8*  CREATININE 0.68 0.57 0.54 0.54  GFRNONAA >60 >60 >60 >60    LIVER FUNCTION TESTS: Recent Labs     02/23/22 1507 02/23/22 1741 06/20/22 1154 09/28/22 2256  BILITOT 0.9 0.9 1.4* 1.2  AST 22 20 24 24   ALT 10 8 14 19   ALKPHOS 101 76 49 78  PROT 6.0* 4.4* 7.4 6.6  ALBUMIN 3.3* 2.5* 4.3 3.4*    TUMOR MARKERS: No results for input(s): "AFPTM", "CEA", "CA199", "CHROMGRNA" in the last 8760 hours.  Assessment and Plan: C7, T3 compression fractures Patient admitted after fall at ALF resulting in C7 and T3 compression fractures.  Patient has undergone prior VP/KP with significant pain relief.  IR consulted for VP/KP of current fractures due to severe pain limiting mobility and requiring frequent IV pain medication.  Case reviewed by Dr. Corliss Skains.  The C7 fracture is not amenable to VP/KP due to increased risk with procedure, however the T3 fracture can be treated if patient stable for intervention.  Will consult anesthesia give age with cormorbidity as well as C7 fracture in C-collar.  Patient just recently tolerated GA during hip repair.  Will send procedure for prior authorization in the event we can proceed.  Discussed with patient and her daughter (via telephone). Daughter is POA and would like to proceed with any/all interventions that may reduce pain and maintain her mobility for safe return to ALF.  Patient is currently workng with PT.  Daughter is aware.   Continue to monitor for results of the above tasks prior to proceeding.    Thank you for this interesting consult.  I greatly enjoyed meeting [name] and look forward to participating in their care.  A copy of this report was sent to the requesting provider on this date.  Electronically Signed: Hoyt Koch, PA 10/01/2022, 3:25 PM   I spent a total of 40 Minutes    in face to face in clinical consultation, greater than 50% of which was counseling/coordinating care for T3 compression fracture.

## 2022-10-01 NOTE — Evaluation (Signed)
Physical Therapy Evaluation Patient Details Name: [name] MRN: [id number] DOB: 02-Sep-1929 Today's Date: 10/01/2022  History of Present Illness  [name] is a 89 y.o. female who presented to [facility] ED from SNF due to an unwitnessed fall. Imaging revealed subdural hematoma seen on CT, new T3 and C7 compression fracture. PMHx:  osteoporosis,T9,10,11, L1 and L2 compression fxs w/ Kyphoplasty, L hip fracture, OA, CVA, CAD, CA, HTN   Clinical Impression  Pt admitted with above diagnosis. PTA pt resided at ALF, mod I transfers and mobility at w/c level. Pt currently with functional limitations due to the deficits listed below (see PT Problem List). On eval, pt required max assist rolling R/L. Unable to tolerate transition OOB due to pain. Pt reports 10/10 pain 'between her shoulder blades.' Generalized weakness noted BLE. Pt will benefit from acute skilled PT to increase their independence and safety with mobility to allow discharge. Recommend further rehab services at d/c, < 3 hours of therapy/day.        Recommendations for follow up therapy are one component of a multi-disciplinary discharge planning process, led by the attending physician.  Recommendations may be updated based on patient status, additional functional criteria and insurance authorization.  Follow Up Recommendations Can patient physically be transported by private vehicle: No     Assistance Recommended at Discharge Frequent or constant Supervision/Assistance  Patient can return home with the following  Two people to help with walking and/or transfers;Two people to help with bathing/dressing/bathroom    Equipment Recommendations Other (comment) (defer to next venue)  Recommendations for Other Services       Functional Status Assessment Patient has had a recent decline in their functional status and demonstrates the ability to make significant improvements in function in a reasonable and predictable amount of  time.     Precautions / Restrictions Precautions Precautions: Fall;Cervical Required Braces or Orthoses: Cervical Brace Cervical Brace: Hard collar;At all times Restrictions Weight Bearing Restrictions: No      Mobility  Bed Mobility Overal bed mobility: Needs Assistance Bed Mobility: Rolling Rolling: Max assist         General bed mobility comments: increased time, significantly limited by pain    Transfers                   General transfer comment: unable due to pain    Ambulation/Gait                  Stairs            Wheelchair Mobility    Modified Rankin (Stroke Patients Only)       Balance                                             Pertinent Vitals/Pain Pain Assessment Pain Assessment: 0-10 Pain Score: 10-Worst pain ever Pain Location: 'back between shoulder blades' Pain Descriptors / Indicators: Discomfort, Grimacing, Guarding Pain Intervention(s): Limited activity within patient's tolerance, Monitored during session, Patient requesting pain meds-RN notified    Home Living Family/patient expects to be discharged to:: Skilled nursing facility                   Additional Comments: [facility]    Prior Function Prior Level of Function : Needs assist       Physical Assist : Mobility (physical) Mobility (physical): Transfers;Gait  Mobility Comments: indep with STS transfers, mobilizes at [facility] level ADLs Comments: mod I for BADLs     Hand Dominance   Dominant Hand: Right    Extremity/Trunk Assessment   Upper Extremity Assessment Upper Extremity Assessment: Defer to OT evaluation    Lower Extremity Assessment Lower Extremity Assessment: Generalized weakness    Cervical / Trunk Assessment Cervical / Trunk Exceptions: T3 and C7 compression, hard collar in place  Communication   Communication: HOH  Cognition Arousal/Alertness: Awake/alert Behavior During Therapy: Anxious Overall  Cognitive Status: Within Functional Limits for tasks assessed                                          General Comments General comments (skin integrity, edema, etc.): SpO2 92% on 3L    Exercises     Assessment/Plan    PT Assessment Patient needs continued PT services  PT Problem List Decreased strength;Decreased balance;Pain;Decreased mobility;Decreased activity tolerance       PT Treatment Interventions DME instruction;Functional mobility training;Balance training;Patient/family education;Therapeutic activities;Therapeutic exercise    PT Goals (Current goals can be found in the Care Plan section)  Acute Rehab PT Goals Patient Stated Goal: decrease pain PT Goal Formulation: With patient Time For Goal Achievement: 10/15/22 Potential to Achieve Goals: Fair    Frequency Min 3X/week     Co-evaluation               AM-PAC PT "6 Clicks" Mobility  Outcome Measure Help needed turning from your back to your side while in a flat bed without using bedrails?: A Lot Help needed moving from lying on your back to sitting on the side of a flat bed without using bedrails?: Total Help needed moving to and from a bed to a chair (including a wheelchair)?: Total Help needed standing up from a chair using your arms (e.g., wheelchair or bedside chair)?: Total Help needed to walk in hospital room?: Total Help needed climbing 3-5 steps with a railing? : Total 6 Click Score: 7    End of Session Equipment Utilized During Treatment: Cervical collar Activity Tolerance: Patient limited by pain Patient left: in bed Nurse Communication: Patient requests pain meds PT Visit Diagnosis: Other abnormalities of gait and mobility (R26.89);Pain    Time: 0907-0920 PT Time Calculation (min) (ACUTE ONLY): 13 min   Charges:   PT Evaluation $PT Eval Moderate Complexity: 1 Mod          Ferd Glassing., PT  Office # [phone]   Ilda Foil 10/01/2022, 10:26 AM

## 2022-10-01 NOTE — Progress Notes (Addendum)
[facility] Progress Note Patient: [name] FAO:[id number] DOB: 06-09-1929 DOA: 09/28/2022  DOS: the patient was seen and examined on 10/01/2022  Brief hospital course: PMH of HTN, polycythemia, depression, GERD, paroxysmal A-fib on Eliquis, PE DVT, stroke, multiple prior kyphoplasty presented to hospital with complaints of an unwitnessed fall.  Found to have a T3 and C7 compression fracture.  Neurosurgery was consulted.  Recommendation is for conservative measures.  IR consulted for kyphoplasty, currently considering possible kyphoplasty for T3 fracture. Assessment and Plan: Subdural hematoma, post fall, POA Neurosurgery evaluated the patient. Repeat CT scan shows no evidence of acute worsening. Currently no focal deficit. Current recommendation is to hold Eliquis for 1 week. No intervention recommended. Outpatient follow-up with neurosurgery in 2 weeks.  Continue c-collar until then.  Can only remove it for bathing. PT OT recommended SNF.   Acute hypoxic respiratory failure Hypoxia appears to have resolved. No pneumothorax or pleural effusion seen on imaging Incentive spirometer DuoNeb as needed for wheezing Monitor.   C7 and T3 compression fracture  Prior history of multiple kyphoplasty  IR consulted to assess for possible kyphoplasty.  Discussion with family.  Currently offering T3 kyphoplasty. Continue pain control  History of CAD. Noncardiac chest pain. H/o takatsubo cardiomyopathy  Patient has CAD.  Multivessel disease.  Significant obstruction and ostial RCA.  Underwent PCI with orbital atherectomy, intracoronary lithotripsy and placement of Synergy DES. Continue Plavix after 10/07/2022. Patient reports intermittent chest pain.  Troponins are negative.  EKG unremarkable.  Repeat echocardiogram shows no wall abnormality. no further workup for now.  Continue pain control.  Leukocytosis Likely stress reaction.  Will monitor.   Hypokalemia Improving. Monitor.    Paroxysmal A-fib on Eliquis Continue amiodarone.  Continue on telemetry.   Holding Eliquis until 10/07/2022.   GERD Resume home PPI   Hyperlipidemia Resume home Crestor   Chronic anxiety/depression Resume home sertraline.  Goals of care conversation. Patient is currently full code. Patient with recurrent falls, history of CAD, PAF CVA, history of cardiomyopathy.  She remains at risk for further fall given her ongoing pain control issues with current fall. Given her anticoagulation she remains at increase risk for further fall leading to further intracranial bleeding episodes. In an event of a cardiac arrest she remains at risk for further injury to her bony structure given her osteopenia with history of multiple kyphoplasties for compression fracture. At present my recommendation is that if the goal is to focus on quality of life, patient should be a DNR as her chances of surviving a cardiac arrest successfully without significant postevent comorbidities is significantly low. Will address that with patient.  Subjective: Continues to report neck pain.  No nausea no vomiting no fever no chills.  Intermittent chest pain also reported.  No shortness of breath.  Physical Exam: General: in Mild distress, No Rash Cardiovascular: S1 and S2 Present, No Murmur Respiratory: Good respiratory effort, Bilateral Air entry present. No Crackles, No wheezes Abdomen: Bowel Sound present, No tenderness Extremities: No edema Neuro: Alert and oriented x3, no new focal deficit  Data Reviewed: I have Reviewed nursing notes, Vitals, and Lab results. Since last encounter, pertinent lab results CBC and BMP   . I have ordered test including CBC and BMP  . I have discussed pt's care plan and test results with IR  .   Disposition: Status is: Inpatient Remains inpatient appropriate because: Need for better pain control or kyphoplasty.  SCDs Start: 09/29/22 0400   Family Communication: No one at bedside,  discussed with daughter on the phone. Level of care: Telemetry Medical   Vitals:   09/30/22 2016 10/01/22 0256 10/01/22 0759 10/01/22 1212  BP: 133/79 134/62 (!) 148/67 (!) 145/64  Pulse: 81 74 77 74  Resp: 18 18 18 18   Temp: 97.8 F (36.6 C) 97.6 F (36.4 C) 98 F (36.7 C) 98.1 F (36.7 C)  TempSrc:  Oral Oral Oral  SpO2: 93% 92% 100% 97%     Author: Lynden Oxford, MD 10/01/2022 2:41 PM  Please look on [email or web address] to find out who is on call.

## 2022-10-02 DIAGNOSIS — S065XAA Traumatic subdural hemorrhage with loss of consciousness status unknown, initial encounter: Secondary | ICD-10-CM | POA: Diagnosis not present

## 2022-10-02 LAB — BASIC METABOLIC PANEL
Anion gap: 7 (ref 5–15)
BUN: 9 mg/dL (ref 8–23)
CO2: 30 mmol/L (ref 22–32)
Calcium: 8.5 mg/dL — ABNORMAL LOW (ref 8.9–10.3)
Chloride: 95 mmol/L — ABNORMAL LOW (ref 98–111)
Creatinine, Ser: 0.57 mg/dL (ref 0.44–1.00)
GFR, Estimated: >60 mL/min (ref >60)
Glucose, Bld: 126 mg/dL — ABNORMAL HIGH (ref 70–99)
Potassium: 4 mmol/L (ref 3.5–5.1)
Sodium: 132 mmol/L — ABNORMAL LOW (ref 135–145)

## 2022-10-02 LAB — MAGNESIUM: Magnesium: 2.1 mg/dL (ref 1.7–2.4)

## 2022-10-02 LAB — CBC
HCT: 42.2 % (ref 36.0–46.0)
Hemoglobin: 12.7 g/dL (ref 12.0–15.0)
MCH: 24.5 pg — ABNORMAL LOW (ref 26.0–34.0)
MCHC: 30.1 g/dL (ref 30.0–36.0)
MCV: 81.3 fL (ref 80.0–100.0)
Platelets: 438 10*3/uL — ABNORMAL HIGH (ref 150–400)
RBC: 5.19 MIL/uL — ABNORMAL HIGH (ref 3.87–5.11)
RDW: 16.7 % — ABNORMAL HIGH (ref 11.5–15.5)
WBC: 9.3 10*3/uL (ref 4.0–10.5)
nRBC: 0 % (ref 0.0–0.2)

## 2022-10-02 NOTE — Progress Notes (Signed)
IR follow up. Have obtained insurance approval for T3 VP/KP. Chart reviewed again. Pt was on Eliquis and Plavix prior to admission. Last doses 5/17, both have been held since. Will plan for procedure to be done 5/23 as need to wait 5 full days off Plavix.  Brayton El PA-C Interventional Radiology 10/02/2022 2:35 PM

## 2022-10-02 NOTE — Care Management Important Message (Signed)
Important Message  Patient Details  Name: KHAMONI LEVENE MRN: [id number] Date of Birth: 01/11/1930   Medicare Important Message Given:  Yes     Jaylyn Booher Stefan Church 10/02/2022, 2:38 PM

## 2022-10-02 NOTE — NC FL2 (Signed)
MEDICAID FL2 LEVEL OF CARE FORM     IDENTIFICATION  Patient Name: [name] Birthdate: May 17, 1929 Sex: female Admission Date (Current Location): 09/28/2022  [facility] and [place] Number:  Producer, television/film/video and Address:  [facility] [place], [address] [place], [place] [address]      Provider Number: [id number]  Attending Physician Name and Address:  Rolly Salter, MD  Relative Name and Phone Number:       Current Level of Care: Hospital Recommended Level of Care: Skilled Nursing Facility Prior Approval Number:    Date Approved/Denied:   PASRR Number: [id number]  Discharge Plan: SNF    Current Diagnoses: Patient Active Problem List   Diagnosis Date Noted   Compression fracture of C7 vertebra (HCC) 10/01/2022   Compression fracture of T3 vertebra (HCC) 10/01/2022   Subdural hematoma (HCC) 09/29/2022   Humerus fracture 07/24/2022   Presence of drug-eluting stent in right coronary artery    Dilutional hyponatremia 02/25/2022   ST elevation myocardial infarction involving right coronary artery (HCC)    Paroxysmal atrial fibrillation (HCC)    Closed right radial fracture 11/25/2021   Protein-calorie malnutrition, severe 10/09/2021   Acute encephalopathy 10/07/2021   Compression fracture of T7 vertebra  10/07/2021   Rib pain on right side 01/24/2021   PE (pulmonary thromboembolism) (HCC) 12/25/2020   Lumbar compression fracture (HCC) 11/16/2020   GERD (gastroesophageal reflux disease)    DVT (deep venous thrombosis) (HCC)    Diverticulitis    Coronary artery disease involving native coronary artery of native heart with unstable angina pectoris (HCC)    Acute midline thoracic back pain 03/04/2020   Closed left hip fracture (HCC) 12/24/2019   ICH (intracerebral hemorrhage) (HCC) 11/04/2019   Intractable back pain 09/13/2019   Compression fracture of T9 vertebra (HCC) 09/13/2019   Chronic diastolic CHF (congestive heart  failure) (HCC) 09/13/2019   Overweight (BMI 25.0-29.9) 09/13/2019   Constipation 09/13/2019   Raynaud's phenomenon without gangrene 05/19/2018   Iron deficiency anemia due to chronic blood loss 12/30/2017   Insomnia 06/25/2017   Superficial thrombophlebitis 04/06/2014   Breast cancer (HCC) 11/05/2011   Polycythemia 11/05/2011   Stroke (HCC) 03/29/2011   OSA (obstructive sleep apnea) 03/29/2011   OA (osteoarthritis) of knee 03/29/2011   Hiatal hernia 03/29/2011   E. coli gastroenteritis 03/29/2011   Depression with anxiety 03/29/2011   TIA (transient ischemic attack) 01/16/2011   CARDIOMYOPATHY 08/03/2009   DJD -- pain mgmt 08/16/2008   GAIT DISTURBANCE 10/21/2007   ANXIETY DEPRESSION 10/08/2007   Hyperlipidemia with target LDL less than 70 06/25/2007   Takotsubo cardiomyopathy 06/25/2007   GERD without esophagitis 06/25/2007   Essential hypertension 09/30/2006   Migraine with aura 09/30/2006   Hyperplastic colon polyp 06/2001    Orientation RESPIRATION BLADDER Height & Weight     Self, Time, Situation  Normal Incontinent Weight:   Height:     BEHAVIORAL SYMPTOMS/MOOD NEUROLOGICAL BOWEL NUTRITION STATUS      Continent Diet (see discharge summary)  AMBULATORY STATUS COMMUNICATION OF NEEDS Skin   Limited Assist Verbally Normal, Bruising (bruising)                       Personal Care Assistance Level of Assistance  Bathing, Feeding, Dressing Bathing Assistance: Maximum assistance Feeding assistance: Limited assistance Dressing Assistance: Maximum assistance     Functional Limitations Info  Sight, Hearing, Speech Sight Info: Adequate Hearing Info: Adequate Speech Info: Adequate  SPECIAL CARE FACTORS FREQUENCY  PT (By licensed PT), OT (By licensed OT)     PT Frequency: 5x/week OT Frequency: 5x/week            Contractures Contractures Info: Not present    Additional Factors Info  Code Status, Allergies Code Status Info: FULL Allergies Info:  Penicillins  Valsartan  Atorvastatin  Carvedilol  Ezetimibe  Fluvastatin Sodium  Magnesium Hydroxide  Meloxicam  Pneumovax (Pneumococcal Polysaccharide Vaccine)  Quinapril Hcl  Simvastatin  Topamax (Topiramate)  Vit D-vit E-safflower Oil           Current Medications (10/02/2022):  This is the current hospital active medication list Current Facility-Administered Medications  Medication Dose Route Frequency Provider Last Rate Last Admin   acetaminophen (TYLENOL) tablet 650 mg  650 mg Oral Q6H PRN Dow Adolph N, DO   650 mg at 09/30/22 1119   amiodarone (PACERONE) tablet 200 mg  200 mg Oral Daily Dow Adolph N, DO   200 mg at 10/01/22 1004   docusate sodium (COLACE) capsule 100 mg  100 mg Oral BID Rolly Salter, MD   100 mg at 10/01/22 2255   ipratropium-albuterol (DUONEB) 0.5-2.5 (3) MG/3ML nebulizer solution 3 mL  3 mL Nebulization Q2H PRN Dow Adolph N, DO       LORazepam (ATIVAN) tablet 0.5 mg  0.5 mg Oral BID PRN Rolly Salter, MD   0.5 mg at 10/01/22 2255   morphine (PF) 2 MG/ML injection 2 mg  2 mg Intravenous Q2H PRN Rolly Salter, MD   2 mg at 10/02/22 0737   pantoprazole (PROTONIX) EC tablet 40 mg  40 mg Oral Daily Dow Adolph N, DO   40 mg at 10/01/22 1004   polyethylene glycol (MIRALAX / GLYCOLAX) packet 17 g  17 g Oral Daily PRN Dow Adolph N, DO       prochlorperazine (COMPAZINE) injection 5 mg  5 mg Intravenous Q6H PRN Dow Adolph N, DO   5 mg at 09/30/22 1800   rosuvastatin (CRESTOR) tablet 20 mg  20 mg Oral QHS Hall, Carole N, DO   20 mg at 10/01/22 2255   sertraline (ZOLOFT) tablet 12.5 mg  12.5 mg Oral QHS Dow Adolph N, DO   12.5 mg at 10/01/22 2254     Discharge Medications: Please see discharge summary for a list of discharge medications.  Relevant Imaging Results:  Relevant Lab Results:   Additional Information SSN [id number]  Kera Deacon A [place], [place]

## 2022-10-02 NOTE — Progress Notes (Signed)
[facility] Progress Note Patient: [name] ZOX:[id number] DOB: Jul 09, 1929 DOA: 09/28/2022  DOS: the patient was seen and examined on 10/02/2022  Brief hospital course: PMH of HTN, polycythemia, depression, GERD, paroxysmal A-fib on Eliquis, PE DVT, stroke, multiple prior kyphoplasty presented to hospital with complaints of an unwitnessed fall.  Found to have a T3 and C7 compression fracture.  Neurosurgery was consulted.  Recommendation is for conservative measures.  IR consulted for kyphoplasty, currently considering possible kyphoplasty for T3 fracture.  Scheduled for 5/23-5 days after holding Plavix. Assessment and Plan: Subdural hematoma, post fall, POA Neurosurgery evaluated the patient. Repeat CT scan shows no evidence of acute worsening. Currently no focal deficit. Current recommendation is to hold Eliquis for 1 week. No intervention recommended. Outpatient follow-up with neurosurgery in 2 weeks.  Continue c-collar until then.  Can only remove it for bathing. PT OT recommended SNF.   Acute hypoxic respiratory failure Hypoxia appears to have resolved. No pneumothorax or pleural effusion seen on imaging Incentive spirometer DuoNeb as needed for wheezing Monitor.   C7 and T3 compression fracture  Prior history of multiple kyphoplasty  IR consulted to assess for possible kyphoplasty.  Currently offering T3 kyphoplasty.  Scheduled on 5/23 to be performed after days of holding Plavix. Continue pain control  History of CAD. Noncardiac chest pain. H/o takatsubo cardiomyopathy  Patient has CAD.  Multivessel disease.  Significant obstruction and ostial RCA.  Underwent PCI with orbital atherectomy, intracoronary lithotripsy and placement of Synergy DES. Continue Plavix after 10/07/2022. Patient reports intermittent chest pain.  Troponins are negative.  EKG unremarkable.  Repeat echocardiogram shows no wall abnormality. no further workup for now.  Continue pain  control.  Leukocytosis Likely stress reaction.  Will monitor.   Hypokalemia Improving. Monitor.   Paroxysmal A-fib on Eliquis Continue amiodarone.  Continue on telemetry.   Holding Eliquis until 10/07/2022.   GERD Resume home PPI   Hyperlipidemia Resume home Crestor   Chronic anxiety/depression Resume home sertraline.  Goals of care conversation. Patient is currently full code. Patient with recurrent falls, history of CAD, PAF CVA, history of cardiomyopathy.  She remains at risk for further fall given her ongoing pain control issues with current fall. Given her anticoagulation she remains at increase risk for further fall leading to further intracranial bleeding episodes. In an event of a cardiac arrest she remains at risk for further injury to her bony structure given her osteopenia with history of multiple kyphoplasties for compression fracture. At present my recommendation is that if the goal is to focus on quality of life, patient should be a DNR as her chances of surviving a cardiac arrest successfully without significant postevent comorbidities is significantly low. Patient would not like to discuss goals of care conversation on 5/21.  Will place palliative care consultation.  Subjective: Pain present.  No nausea vomiting fever no chills.  No shortness of breath.  Physical Exam: In mild distress. S1 and S2 present.  Aortic murmur. Clear to auscultation.  S1-S2 present. No edema.  Data Reviewed: I have Reviewed nursing notes, Vitals, and Lab results. Since last encounter, pertinent lab results CBC and BMP   . I have ordered test including CBC and BMP  .    Disposition: Status is: Inpatient Remains inpatient appropriate because: Awaiting kyphoplasty.  SCDs Start: 09/29/22 0400   Family Communication: No one at bedside, discussed with daughter on the phone on 5/20. Level of care: Telemetry Medical   Vitals:   10/02/22 0421 10/02/22 0751 10/02/22 1213  10/02/22  1542  BP: (!) 154/73 (!) 151/73 (!) 158/71 (!) 156/81  Pulse: 87 80 83 89  Resp: 16 18 18 18   Temp: 98.1 F (36.7 C) 97.6 F (36.4 C) 98.4 F (36.9 C) 97.7 F (36.5 C)  TempSrc:    Oral  SpO2: 93% 90% (!) 89% 100%     Author: Lynden Oxford, MD 10/02/2022 6:47 PM  Please look on [email or web address] to find out who is on call.

## 2022-10-03 DIAGNOSIS — Z7189 Other specified counseling: Secondary | ICD-10-CM | POA: Diagnosis not present

## 2022-10-03 DIAGNOSIS — F418 Other specified anxiety disorders: Secondary | ICD-10-CM

## 2022-10-03 DIAGNOSIS — D751 Secondary polycythemia: Secondary | ICD-10-CM

## 2022-10-03 DIAGNOSIS — I10 Essential (primary) hypertension: Secondary | ICD-10-CM | POA: Diagnosis not present

## 2022-10-03 DIAGNOSIS — S22030A Wedge compression fracture of third thoracic vertebra, initial encounter for closed fracture: Secondary | ICD-10-CM

## 2022-10-03 DIAGNOSIS — E43 Unspecified severe protein-calorie malnutrition: Secondary | ICD-10-CM

## 2022-10-03 DIAGNOSIS — I2699 Other pulmonary embolism without acute cor pulmonale: Secondary | ICD-10-CM

## 2022-10-03 DIAGNOSIS — R296 Repeated falls: Secondary | ICD-10-CM

## 2022-10-03 DIAGNOSIS — C50011 Malignant neoplasm of nipple and areola, right female breast: Secondary | ICD-10-CM

## 2022-10-03 DIAGNOSIS — S12690A Other displaced fracture of seventh cervical vertebra, initial encounter for closed fracture: Secondary | ICD-10-CM

## 2022-10-03 DIAGNOSIS — S065XAA Traumatic subdural hemorrhage with loss of consciousness status unknown, initial encounter: Secondary | ICD-10-CM | POA: Diagnosis not present

## 2022-10-03 DIAGNOSIS — Z955 Presence of coronary angioplasty implant and graft: Secondary | ICD-10-CM

## 2022-10-03 MED ORDER — ENSURE ENLIVE PO LIQD
237.0000 mL | Freq: Two times a day (BID) | ORAL | Status: DC
  Administered 2022-10-05 – 2022-10-09 (×9): 237 mL via ORAL

## 2022-10-03 MED ORDER — VANCOMYCIN HCL IN DEXTROSE 1-5 GM/200ML-% IV SOLN
1000.0000 mg | INTRAVENOUS | Status: AC
  Administered 2022-10-04: 1000 mg via INTRAVENOUS
  Filled 2022-10-03: qty 200

## 2022-10-03 MED ORDER — OXYCODONE HCL 5 MG PO TABS
5.0000 mg | ORAL_TABLET | Freq: Three times a day (TID) | ORAL | Status: DC | PRN
  Administered 2022-10-03 – 2022-10-06 (×9): 5 mg via ORAL
  Filled 2022-10-03 (×11): qty 1

## 2022-10-03 MED ORDER — ACETAMINOPHEN 325 MG PO TABS
650.0000 mg | ORAL_TABLET | Freq: Four times a day (QID) | ORAL | Status: DC
  Administered 2022-10-03 – 2022-10-09 (×20): 650 mg via ORAL
  Filled 2022-10-03 (×20): qty 2

## 2022-10-03 NOTE — Progress Notes (Signed)
PROGRESS NOTE  [name] ZOX:[id number] DOB: Apr 30, 1930   PCP: [facility]  Patient is from: SNF  DOA: 09/28/2022 LOS: 4  Chief complaints Chief Complaint  Patient presents with   Fall     Brief Narrative / Interim history: 89 year old F with PMH of HTN, polycythemia, depression, GERD, CVA, PAF/PE/DVT on Eliquis, multiple prior kyphoplasty presented to hospital with complaints of an unwitnessed fall and admitted from subdural hematoma and, C7 and T3 compression fracture. Found to have a T3 and C7 compression fracture. Neurosurgery recommended conservative management.  IR planning kyphoplasty of T3 compression fracture on 5/23 after Plavix washout.      Subjective: Seen and examined earlier this morning.  No major events overnight of this morning.  Patient is somewhat sleepy and not alert after pain medication.  She denies pain although she was complaining of significant pain early in the morning.  She is oriented to self and follows commands but not able to tell me time and place.  Objective: Vitals:   10/02/22 2100 10/03/22 0020 10/03/22 0445 10/03/22 0731  BP:  (!) 149/80 (!) 147/75 (!) 143/79  Pulse:  94 92 96  Resp:  18 18 18   Temp:  97.6 F (36.4 C) 98.4 F (36.9 C) 98.1 F (36.7 C)  TempSrc:      SpO2: 93% 96% 90% 96%    Examination:  GENERAL: No apparent distress.  Nontoxic. HEENT: MMM.  Vision and hearing grossly intact.  NECK: Neck collar in place. RESP:  No IWOB.  Fair aeration bilaterally. CVS:  RRR. Heart sounds normal.  ABD/GI/GU: BS+. Abd soft, NTND.  MSK/EXT:  Moves extremities. No apparent deformity. No edema.  SKIN: no apparent skin lesion or wound NEURO: Awake but not alert.  Oriented to self.  Follows commands.  No apparent focal neuro deficit. PSYCH: Calm. Normal affect.   Procedures:  None  Microbiology summarized: None  Assessment and plan: Principal Problem:   Subdural hematoma (HCC) Active Problems:    Polycythemia   Essential hypertension   Chronic diastolic CHF (congestive heart failure) (HCC)   PE (pulmonary thromboembolism) (HCC)   GERD (gastroesophageal reflux disease)   Breast cancer (HCC)   Depression with anxiety   Protein-calorie malnutrition, severe   Presence of drug-eluting stent in right coronary artery   Compression fracture of C7 vertebra (HCC)   Compression fracture of T3 vertebra (HCC)  Subdural hematoma, post fall, POA: Initial CT showed 3 mm acute small right parafalcine SDH.  Stable on repeat CT.  Neurosurgery recommended holding Eliquis for 1 week and outpatient follow-up in 2 weeks.  Of note, patient is also on Plavix which could increase her risk of bleeding. -Neurosurgery recommended conservative management.   C7 compression fracture: CT showed C7 fracture with about 15% height loss and mild anterior bulge -Continue c-collar until then.  Can only remove it for bathing. -PT OT recommended SNF.   Acute hypoxic respiratory failure: Seems resolved. -Continue breathing treatments   T3 compression fracture: CT showed acute T3 compression fracture with 30 to 40% height loss and mild fragmentation but no cord compression. -Plan for kyphoplasty on 5/23 after Plavix washout -Pain control   History of CAD/Takotsubo cardiomyopathy: Patient with multivessel CAD.  She underwent PCI with orbital atherectomy, intracoronary lithotripsy and placement of Synergy DES in 02/2022.  Patient with intermittent chest pain likely musculoskeletal.  TTE reassuring.  -Continue Plavix after 10/07/2022. -Continue statin.  Paroxysmal A-fib on Eliquis: Rate controlled. -Continue amiodarone -Continue holding Eliquis -Optimize  electrolytes   Leukocytosis: Resolved.   Hypokalemia: Resolved.  Mild hyponatremia: Stable -Continue monitoring  GERD -Resume home PPI   Hyperlipidemia -Continue home Crestor.   Chronic anxiety/depression -Continue home meds   Goals of care  conversation: Remains full code.  See goals of care discussion by previous attending on 5/21. -Follow palliative care recommendation  There is no height or weight on file to calculate BMI.          DVT prophylaxis:  SCDs Start: 09/29/22 0400  Code Status: Full code Family Communication: None at bedside Level of care: Telemetry Medical Status is: Inpatient Remains inpatient appropriate because: Due to thoracic spine compression fracture   Final disposition: SNF Consultants:  Neurosurgery  35 minutes with more than 50% spent in reviewing records, counseling patient/family and coordinating care.   Sch Meds:  Scheduled Meds:  amiodarone  200 mg Oral Daily   docusate sodium  100 mg Oral BID   pantoprazole  40 mg Oral Daily   rosuvastatin  20 mg Oral QHS   sertraline  12.5 mg Oral QHS   Continuous Infusions: PRN Meds:.acetaminophen, ipratropium-albuterol, LORazepam, morphine injection, oxyCODONE, polyethylene glycol, prochlorperazine  Antimicrobials: Anti-infectives (From admission, onward)    None        I have personally reviewed the following labs and images: CBC: Recent Labs  Lab 09/28/22 2256 09/28/22 2257 09/29/22 0215 09/29/22 0219 09/30/22 0445 10/01/22 0248 10/02/22 0725  WBC 13.6*  --   --  14.3* 10.9* 8.8 9.3  HGB 12.7   < > 13.6 11.9* 12.4 11.9* 12.7  HCT 42.0   < > 40.0 40.1 41.5 39.8 42.2  MCV 82.7  --   --  83.7 82.5 83.4 81.3  PLT 428*  --   --  398 390 363 438*   < > = values in this interval not displayed.   BMP &GFR Recent Labs  Lab 09/29/22 0219 09/29/22 1102 09/30/22 0445 10/01/22 0248 10/02/22 0725  NA 135 134* 131* 131* 132*  K 2.9* 3.7 3.5 3.4* 4.0  CL 96* 96* 94* 94* 95*  CO2 27 30 28 30 30   GLUCOSE 160* 153* 130* 113* 126*  BUN 11 10 11 11 9   CREATININE 0.68 0.57 0.54 0.54 0.57  CALCIUM 8.5* 8.4* 8.1* 7.8* 8.5*  MG 1.9  --  2.1 2.1 2.1   CrCl cannot be calculated (Unknown ideal weight.). Liver & Pancreas: Recent  Labs  Lab 09/28/22 2256  AST 24  ALT 19  ALKPHOS 78  BILITOT 1.2  PROT 6.6  ALBUMIN 3.4*   No results for input(s): "LIPASE", "AMYLASE" in the last 168 hours. No results for input(s): "AMMONIA" in the last 168 hours. Diabetic: No results for input(s): "HGBA1C" in the last 72 hours. No results for input(s): "GLUCAP" in the last 168 hours. Cardiac Enzymes: No results for input(s): "CKTOTAL", "CKMB", "CKMBINDEX", "TROPONINI" in the last 168 hours. No results for input(s): "PROBNP" in the last 8760 hours. Coagulation Profile: Recent Labs  Lab 09/28/22 2256  INR 1.4*   Thyroid Function Tests: No results for input(s): "TSH", "T4TOTAL", "FREET4", "T3FREE", "THYROIDAB" in the last 72 hours. Lipid Profile: No results for input(s): "CHOL", "HDL", "LDLCALC", "TRIG", "CHOLHDL", "LDLDIRECT" in the last 72 hours. Anemia Panel: No results for input(s): "VITAMINB12", "FOLATE", "FERRITIN", "TIBC", "IRON", "RETICCTPCT" in the last 72 hours. Urine analysis:    Component Value Date/Time   COLORURINE STRAW (A) 02/27/2022 1639   APPEARANCEUR CLEAR 02/27/2022 1639   LABSPEC 1.004 (L) 02/27/2022 1639  LABSPEC 1.010 05/27/2013 1433   PHURINE 7.0 02/27/2022 1639   GLUCOSEU NEGATIVE 02/27/2022 1639   GLUCOSEU NEGATIVE 09/02/2017 1347   HGBUR NEGATIVE 02/27/2022 1639   HGBUR negative 10/06/2009 1015   BILIRUBINUR NEGATIVE 02/27/2022 1639   BILIRUBINUR Negative 01/21/2018 1618   KETONESUR NEGATIVE 02/27/2022 1639   PROTEINUR NEGATIVE 02/27/2022 1639   UROBILINOGEN negative (A) 01/21/2018 1618   UROBILINOGEN 0.2 09/02/2017 1347   UROBILINOGEN 0.2 05/27/2013 1433   NITRITE NEGATIVE 02/27/2022 1639   LEUKOCYTESUR NEGATIVE 02/27/2022 1639   Sepsis Labs: Invalid input(s): "PROCALCITONIN", "LACTICIDVEN"  Microbiology: No results found for this or any previous visit (from the past 240 hour(s)).  Radiology Studies: No results found.    Rahmah Mccamy T. Penda Venturi [facility]  If 7PM-7AM, please  contact night-coverage [email or web address] 10/03/2022, 12:18 PM

## 2022-10-03 NOTE — Consult Note (Signed)
Consultation Note Date: 10/03/2022   Patient Name: [name]  DOB: 1929-05-17  MRN: [id number]  Age / Sex: 89 y.o., female  PCP: [facility] Referring Physician: Lynden Oxford  Reason for Consultation:  "goals of care conversation"  HPI/Patient Profile: 89 y.o. female  with past medical history of HTN, polycythemia, depression, GERD, CVA, PAF/PE/DVT on Eliquis, takatsubo cardiomyopathy, hospitalization in March for fall resulting in humerus fracture, rib fractures and femur fracture and discharged to assisted living, admitted on 09/28/2022 after a fall resulting in subdural hematoma, T3 and C7 compressions fractures. Neurosurgery consulted and no recommendations for subdural hematoma- no current residual deficits. Kyphoplasty planned for tomorrow 5/23. Palliative medicine consulted for advanced care planning due to patient high risk for further falls on anticoagulation. Dr. Allena Katz attempted to discuss code status with patient- recommended DNR, however, patient did not want to discuss.   Primary Decision Maker PATIENT - if she is unable then her daughter- [name] states she has HCPOA- no copy on chart- I have requested her to bring so copy can be made and put on patient's chart  Discussion: Chart reviewed including labs, progress notes, imaging from this and previous encounters.  Evaluated patient. She was sleeping. Did not arouse to my voice or soft touch.  Per chart review she has been able to participate in discussions.  I called her daughter and reviewed recommendation for Palliative discussions and need for advanced care planning.  [name] shares that she supports DNR code status, however, her Mom is "strong willed". [name] is also concerned about her mother's next setting of care. I recommended in person Palliative meeting with patient and [name] present.     SUMMARY OF  RECOMMENDATIONS -PMT will call [name] tomorrow morning to arrange in person GOC discussion -Requested daughter bring copy of HCPOA -Continue current plan of care    Code Status/Advance Care Planning: Full code   Prognosis:   Unable to determine  Discharge Planning: To Be Determined  Primary Diagnoses: Present on Admission:  Subdural hematoma (HCC)  Polycythemia  Essential hypertension  Chronic diastolic CHF (congestive heart failure) (HCC)  PE (pulmonary thromboembolism) (HCC)  GERD (gastroesophageal reflux disease)  Breast cancer (HCC)  Depression with anxiety  Compression fracture of C7 vertebra (HCC)  Compression fracture of T3 vertebra (HCC)  Presence of drug-eluting stent in right coronary artery  Protein-calorie malnutrition, severe   Review of Systems  Physical Exam  Vital Signs: BP (!) 143/79 (BP Location: Left Arm)   Pulse 96   Temp 98.1 F (36.7 C)   Resp 18   SpO2 96%  Pain Scale: 0-10   Pain Score: 8    SpO2: SpO2: 96 % O2 Device:SpO2: 96 % O2 Flow Rate: .O2 Flow Rate (L/min): 2 L/min  IO: Intake/output summary:  Intake/Output Summary (Last 24 hours) at 10/03/2022 1339 Last data filed at 10/02/2022 2100 Gross per 24 hour  Intake --  Output 100 ml  Net -100 ml    LBM: Last BM Date : 10/02/22 Baseline Weight:  Most recent weight:         Thank you for this consult. Palliative medicine will continue to follow and assist as needed.  Time Total: 60 minutes Greater than 50%  of this time was spent counseling and coordinating care related to the above assessment and plan.  Signed by: Ocie Bob, AGNP-C Palliative Medicine    Please contact Palliative Medicine Team phone at [phone] for questions and concerns.  For individual provider: See Loretha Stapler

## 2022-10-03 NOTE — Progress Notes (Signed)
Occupational Therapy Treatment Patient Details Name: [name] MRN: [id number] DOB: [birth year] Today's Date: 10/03/2022   History of present illness [name] is a 89 y.o. female who presented to [facility] ED from SNF due to an unwitnessed fall. Imaging revealed subdural hematoma seen on CT, new T3 and C7 compression fracture. PMHx:  osteoporosis,T9,10,11, L1 and L2 compression fxs w/ Kyphoplasty, L hip fracture, OA, CVA, CAD, CA, HTN   OT comments  Pt is making good progress towards their acute OT goals. Upon arrival pt was eager to attempt OOB. For pain management and activity tolerance she was transferred from supine to sitting with total A +2 with fair unsupported sitting balance and mild increased in pain. Pt stood EOB with min A +2 and HHA and tolerated standing for ~1 minute prior to significant pain and needing to be transferred back supine. Pt with plans for sx tomorrow. OT to continue to follow acutely to facilitate progress towards established goals. Pt will continue to benefit from skilled inpatient follow up therapy, <3 hours/day.    Recommendations for follow up therapy are one component of a multi-disciplinary discharge planning process, led by the attending physician.  Recommendations may be updated based on patient status, additional functional criteria and insurance authorization.    Assistance Recommended at Discharge Frequent or constant Supervision/Assistance  Patient can return home with the following  A lot of help with walking and/or transfers;A lot of help with bathing/dressing/bathroom;Assistance with cooking/housework;Assistance with feeding;Direct supervision/assist for medications management;Direct supervision/assist for financial management;Help with stairs or ramp for entrance;Assist for transportation   Equipment Recommendations  None recommended by OT       Precautions / Restrictions Precautions Precautions: Fall;Cervical Precaution Booklet Issued:  No Required Braces or Orthoses: Cervical Brace Cervical Brace: Hard collar;At all times Restrictions Weight Bearing Restrictions: No       Mobility Bed Mobility Overal bed mobility: Needs Assistance Bed Mobility: Supine to Sit, Sit to Supine     Supine to sit: Total assist, +2 for physical assistance, +2 for safety/equipment Sit to supine: Total assist, +2 for physical assistance, +2 for safety/equipment   General bed mobility comments: helicopter method utilized to pain management    Transfers Overall transfer level: Needs assistance Equipment used: 2 person hand held assist Transfers: Sit to/from Stand Sit to Stand: Min assist, +2 physical assistance, +2 safety/equipment           General transfer comment: min A +2 to power up, min A +2 for marching. Only able to tolerate standing for ~1 minute     Balance Overall balance assessment: Needs assistance Sitting-balance support: Feet supported Sitting balance-Leahy Scale: Fair     Standing balance support: Bilateral upper extremity supported, During functional activity Standing balance-Leahy Scale: Poor                             ADL either performed or assessed with clinical judgement   ADL Overall ADL's : Needs assistance/impaired                         Toilet Transfer: Minimal assistance;+2 for physical assistance;+2 for safety/equipment Toilet Transfer Details (indicate cue type and reason): simulated at the EOB         Functional mobility during ADLs: Minimal assistance;+2 for safety/equipment;+2 for physical assistance General ADL Comments: continues to be limited by pain and activity tolerane    Extremity/Trunk Assessment Upper Extremity  Assessment Upper Extremity Assessment: Generalized weakness   Lower Extremity Assessment Lower Extremity Assessment: Defer to PT evaluation        Vision   Vision Assessment?: No apparent visual deficits   Perception  Perception Perception: Not tested   Praxis Praxis Praxis: Not tested    Cognition Arousal/Alertness: Awake/alert Behavior During Therapy: Anxious Overall Cognitive Status: Within Functional Limits for tasks assessed                                 General Comments: tangential thought/verbose              General Comments VSS. Pt eager to attempt OOB this date    Pertinent Vitals/ Pain       Pain Assessment Pain Assessment: Faces Faces Pain Scale: Hurts even more Pain Location: 'back between shoulder blades' Pain Descriptors / Indicators: Discomfort, Grimacing, Guarding Pain Intervention(s): Limited activity within patient's tolerance, Monitored during session         Frequency  Min 2X/week        Progress Toward Goals  OT Goals(current goals can now be found in the care plan section)  Progress towards OT goals: Progressing toward goals  Acute Rehab OT Goals Patient Stated Goal: less pain OT Goal Formulation: With patient Time For Goal Achievement: 10/13/22 Potential to Achieve Goals: Good ADL Goals Pt Will Perform Grooming: with set-up;sitting Pt Will Perform Upper Body Dressing: with min assist;sitting Pt Will Perform Lower Body Dressing: with mod assist;sit to/from stand Pt Will Transfer to Toilet: with mod assist;stand pivot transfer;bedside commode Additional ADL Goal #1: Pt will complete bed mobility with min A as a precursor to ADLs  Plan Discharge plan remains appropriate       AM-PAC OT "6 Clicks" Daily Activity     Outcome Measure   Help from another person eating meals?: Total Help from another person taking care of personal grooming?: A Lot Help from another person toileting, which includes using toliet, bedpan, or urinal?: A Lot Help from another person bathing (including washing, rinsing, drying)?: Total Help from another person to put on and taking off regular upper body clothing?: Total Help from another person to put on  and taking off regular lower body clothing?: Total 6 Click Score: 8    End of Session Equipment Utilized During Treatment: Cervical collar;Gait belt  OT Visit Diagnosis: Unsteadiness on feet (R26.81);Other abnormalities of gait and mobility (R26.89);Muscle weakness (generalized) (M62.81);History of falling (Z91.81);Repeated falls (R29.6);Pain   Activity Tolerance Patient limited by pain   Patient Left in bed;with call bell/phone within reach   Nurse Communication Mobility status        Time: 1400-1420 OT Time Calculation (min): 20 min  Charges: OT General Charges $OT Visit: 1 Visit OT Treatments $Therapeutic Activity: 8-22 mins  Derenda Mis, OTR/L Acute Rehabilitation Services Office [phone] Secure Chat Communication Preferred   Donia Pounds 10/03/2022, 4:36 PM

## 2022-10-03 NOTE — Plan of Care (Signed)
Patient AOX3, disoriented to time.  VSS throughout shift.  All meds given on time as ordered.  PRN morphine given for pain.  Pt removed C-spine collar, oxygen and tele leads after much education.  Bilateral hand mitts placed.  Purewick in place.  POC maintained, will continue to monitor.  Problem: Education: Goal: Knowledge of General Education information will improve Description: Including pain rating scale, medication(s)/side effects and non-pharmacologic comfort measures Outcome: Progressing   Problem: Health Behavior/Discharge Planning: Goal: Ability to manage health-related needs will improve Outcome: Progressing   Problem: Clinical Measurements: Goal: Ability to maintain clinical measurements within normal limits will improve Outcome: Progressing Goal: Will remain free from infection Outcome: Progressing Goal: Diagnostic test results will improve Outcome: Progressing Goal: Respiratory complications will improve Outcome: Progressing Goal: Cardiovascular complication will be avoided Outcome: Progressing   Problem: Activity: Goal: Risk for activity intolerance will decrease Outcome: Progressing   Problem: Nutrition: Goal: Adequate nutrition will be maintained Outcome: Progressing   Problem: Coping: Goal: Level of anxiety will decrease Outcome: Progressing   Problem: Elimination: Goal: Will not experience complications related to bowel motility Outcome: Progressing Goal: Will not experience complications related to urinary retention Outcome: Progressing   Problem: Pain Managment: Goal: General experience of comfort will improve Outcome: Progressing   Problem: Safety: Goal: Ability to remain free from injury will improve Outcome: Progressing   Problem: Skin Integrity: Goal: Risk for impaired skin integrity will decrease Outcome: Progressing

## 2022-10-03 NOTE — Progress Notes (Incomplete)
Initial Nutrition Assessment  DOCUMENTATION CODES:   Not applicable  INTERVENTION:  - Advance diet post procedure.   - Continue Ensure Enlive po BID, each supplement provides 350 kcal and 20 grams of protein.  NUTRITION DIAGNOSIS:   Inadequate oral intake related to lethargy/confusion, poor appetite as evidenced by meal completion < 50%.  GOAL:   Patient will meet greater than or equal to 90% of their needs  MONITOR:   PO intake, Supplement acceptance, Diet advancement  REASON FOR ASSESSMENT:   Malnutrition Screening Tool    ASSESSMENT:   89 y.o. female admits related to fall. PMH includes: CAD, cancer, CHF, GERD, hiatal hernia, HLD, HTN, osteoporosis, CVA. Pt is currently receiving medical management related to subdural hematoma.  Meds reviewed:  colace. Labs reviewed: Na low.   Pt is currently NPO for procedure. Pt is oriented x2. Per record, pt has not been meeting her needs since admission. Pt has Ensure BID ordered. No wt loss per record. RD will continue to monitor for diet advancement and POC.   NUTRITION - FOCUSED PHYSICAL EXAM:  RD working remotely.   Diet Order:   Diet Order             Diet NPO time specified Except for: Sips with Meds  Diet effective midnight                   EDUCATION NEEDS:   Not appropriate for education at this time  Skin:  Skin Assessment: Reviewed RN Assessment  Last BM:  5/21  Height:   Ht Readings from Last 1 Encounters:  07/25/22 5\' 6"  (1.676 m)    Weight:   Wt Readings from Last 1 Encounters:  07/25/22 57 kg    Ideal Body Weight:     BMI:  There is no height or weight on file to calculate BMI.  Estimated Nutritional Needs:   Kcal:  1425-1710 kcals  Protein:  70-85 gm  Fluid:  >/= 1.4 L  Bethann Humble, RD, LDN, CNSC.

## 2022-10-03 NOTE — Plan of Care (Signed)

## 2022-10-03 NOTE — H&P (Signed)
Chief Complaint: Patient was seen in consultation today for T3 compression fracture  Referring Physician(s): * No referring provider recorded for this case *  Supervising Physician: Julieanne Cotton  Patient Status: [facility] - In-pt  History of Present Illness: [name] is a 89 y.o. female who presented to [facility] ED after an unwitnessed fall. Imaging revealed subdural hematoma seen on CT, as well as new T3 and C7 compression fracture. She has undergone prior treatment at T4 (11/10/19) as well as T8 (07/14/20).  She has experienced several falls recently including a hip fracture in March earlier this year for which she required operative management. Case reviewed by Dr. Corliss Skains for possible vertebroplasty/kyphoplasty.    Past Medical History:  Diagnosis Date   CAD (coronary artery disease)    mild nonobstructive disease on cath in 2003   Cancer [facility])    Cardiomyopathy    Probable Takotsubo, severe CP w/ normal cath in 1994. Severe CP in 2003 w/ widespread T wave inversions on ECG. Cath w/ minimal coronary disease but LV-gram showed periapical severe hypokinesis and basilar hyperkinesia (EF 40%). Last echo in 4/09 confirmed full LV functional recovery with EF 60%, no regional wall motion abnormalities, mild to moderate LVH.   Chronic diastolic CHF (congestive heart failure) (HCC) 09/13/2019   CVA (cerebral infarction)    Depression with anxiety 03/29/2011   lost husband 3'09   DVT (deep venous thrombosis) (HCC)    after venous ablation, R leg   Fracture 09/10/2007   L2, status post vertebroplasty of L2 performed by IR   GERD (gastroesophageal reflux disease)    Hemorrhoids    Hiatal hernia 03/29/2011   no nerve problems   Hyperlipidemia    Hyperplastic colon polyp 06/2001   Hypertension    Iron deficiency anemia due to chronic blood loss 12/30/2017   OA (osteoarthritis) of knee 03/29/2011   w/ bilateral knee pain-not a problem now   OSA (obstructive sleep apnea)  03/29/2011   no cpap used, not a problem now.   Osteoporosis    Otalgia of both ears    Dr. Emeline Darling   Polycythemia    Stroke [facility]) 03/29/2011   CVA x2 -last 10'12-?TIA(visual problems)   Varicose vein     Past Surgical History:  Procedure Laterality Date   APPENDECTOMY     BACK SURGERY     BREAST BIOPSY     BREAST CYST EXCISION     BREAST LUMPECTOMY Left 2013   left stage I left breast cancer   CHOLECYSTECTOMY  04/09/2011   Procedure: LAPAROSCOPIC CHOLECYSTECTOMY WITH INTRAOPERATIVE CHOLANGIOGRAM;  Surgeon: Adolph Pollack, MD;  Location: [facility] ORS;  Service: General;  Laterality: N/A;   CORONARY ATHERECTOMY N/A 02/26/2022   Procedure: CORONARY ATHERECTOMY;  Surgeon: Yvonne Kendall, MD;  Location: [facility] INVASIVE CV LAB;  Service: Cardiovascular;  Laterality: N/A;   CORONARY LITHOTRIPSY N/A 02/26/2022   Procedure: CORONARY LITHOTRIPSY;  Surgeon: Yvonne Kendall, MD;  Location: [facility] INVASIVE CV LAB;  Service: Cardiovascular;  Laterality: N/A;   CORONARY STENT INTERVENTION N/A 02/26/2022   Procedure: CORONARY STENT INTERVENTION;  Surgeon: Yvonne Kendall, MD;  Location: [facility] INV[facility] CV LAB;  Service: Cardiovascular;  Laterality: N/A;   CORONARY ULTRASOUND/IVUS N/A 02/26/2022   Procedure: Intravascular Ultrasound/IVUS;  Surgeon: Yvonne Kendall, MD;  Location: [facility];  Service: Cardiovascular;  Laterality: N/A;   Dental Extraction     L maxillary molar   FEMUR IM NAIL Right 07/25/2022   Procedure: INTRAMEDULLARY (IM) NAIL FEMORAL;  Surgeon:  Yolonda Kida, MD;  Location: [facility] OR;  Service: Orthopedics;  Laterality: Right;  hana table,carm.synthesis   INTRAMEDULLARY (IM) NAIL INTERTROCHANTERIC Left 12/25/2019   Procedure: INTRAMEDULLARY (IM) NAIL INTERTROCHANTRIC;  Surgeon: Durene Romans, MD;  Location: [facility] ORS;  Service: Orthopedics;  Laterality: Left;   IR KYPHO THORACIC WITH BONE BIOPSY  09/15/2019   IR KYPHO THORACIC WITH BONE BIOPSY  07/13/2020       IR KYPHO THORACIC WITH  BONE BIOPSY  07/13/2020   IR VERTEBROPLASTY CERV/THOR BX INC UNI/BIL INC/INJECT/IMAGING  11/10/2019   KYPHOPLASTY N/A 11/16/2020   Procedure: T11,  L1,KYPHOPLASTY;  Surgeon: Venita Lick, MD;  Location: [facility] OR;  Service: Orthopedics;  Laterality: N/A;   KYPHOSIS SURGERY  08/2007   cement used   LEFT HEART CATH AND CORONARY ANGIOGRAPHY N/A 02/23/2022   Procedure: LEFT HEART CATH AND CORONARY ANGIOGRAPHY;  Surgeon: [place]az[place], Peter M, MD;  Location: [facility] INVASIVE CV LAB;  Service: Cardiovascular;  Laterality: N/A;   LEFT HEART CATH AND CORONARY ANGIOGRAPHY N/A 02/26/2022   Procedure: LEFT HEART CATH AND CORONARY ANGIOGRAPHY;  Surgeon: Yvonne Kendall, MD;  [facility];  Service: Cardiovascular;  Laterality: N/A;   OVARIAN CYST SURGERY     left   RADIOLOGY WITH ANESTHESIA N/A 11/10/2019   Procedure: VERTEBROPLASTY;  Surgeon: Julieanne Cotton, MD;  Location: [facility] OR;  Service: Radiology;  Laterality: N/A;   TEMPORARY PACEMAKER N/A 02/26/2022   Procedure: TEMPORARY PACEMAKER;  Surgeon: Yvonne Kendall, MD;  [facility]: [facility];  Service: Cardiovascular;  Laterality: N/A;   TONSILLECTOMY     TOTAL KNEE ARTHROPLASTY  06/2010   left   TUBAL LIGATION      Allergies: Penicillins, Valsartan, Atorvastatin, Carvedilol, Ezetimibe, Fluvastatin sodium, Magnesium hydroxide, Meloxicam, Pneumovax [pneumococcal polysaccharide vaccine], Quinapril hcl, Simvastatin, Topamax [topiramate], and Vit d-vit e-safflower oil  Medications: Prior to Admission medications   Medication Sig Start Date End Date Taking? Authorizing Provider  calcium carbonate (TUMS - DOSED IN MG ELEMENTAL CALCIUM) 500 MG chewable tablet Chew 1 tablet (200 mg of elemental calcium total) by mouth 3 (three) times daily as needed for indigestion or heartburn. 07/28/22  Yes Dorcas Carrow, MD  furosemide (LASIX) 20 MG tablet Take 1 tablet (20 mg total) by mouth daily as needed for fluid or edema (for weigh gain of 3 pounds or more  over one day). Patient taking differently: Take 40 mg by mouth daily. 02/28/22  Yes Arty Baumgartner, NP  LORazepam (ATIVAN) 0.5 MG tablet Take 0.5 mg by mouth 2 (two) times daily.   Yes [provider]  nitroGLYCERIN (NITROSTAT) 0.4 MG SL tablet Place 1 tablet (0.4 mg total) under the tongue every 5 (five) minutes x 3 doses as needed for chest pain. 02/28/22  Yes Laverda Page B, NP  ondansetron (ZOFRAN-ODT) 4 MG disintegrating tablet Take 4 mg by mouth every 8 (eight) hours as needed. 08/24/22  Yes [provider]  traMADol (ULTRAM) 50 MG tablet Take 50 mg by mouth daily as needed. 09/05/22  Yes [provider]  acetaminophen (TYLENOL) 325 MG tablet Take 650 mg by mouth every 6 (six) hours as needed for mild pain.    [provider]  alendronate (FOSAMAX) 70 MG tablet Take 1 tablet (70 mg total) by mouth once a week. Take with a full glass of water on an empty stomach. 12/19/21   Wanda Plump, MD  amiodarone (PACERONE) 200 MG tablet Take 200 mg by mouth daily. 06/18/22   [provider]  amLODipine (NORVASC) 5 MG tablet Take 1 tablet (5 mg total) by mouth daily. 12/19/21   Wanda Plump, MD  apixaban (ELIQUIS) 2.5 MG TABS tablet Take 1 tablet (2.5 mg total) by mouth 2 (two) times daily. Patient taking differently: Take 2.5 mg by mouth daily. 02/28/22   Arty Baumgartner, NP  clopidogrel (PLAVIX) 75 MG tablet Take 1 tablet (75 mg total) by mouth daily. 03/01/22   Arty Baumgartner, NP  docusate sodium (COLACE) 100 MG capsule Take 1 capsule (100 mg total) by mouth 2 (two) times daily. 07/27/22   Dorcas Carrow, MD  HYDROcodone-acetaminophen (NORCO/VICODIN) 5-325 MG tablet Take 1 tablet by mouth every 6 (six) hours as needed for moderate pain or severe pain. Patient not taking: Reported on 09/30/2022 07/27/22 07/27/23  Dorcas Carrow, MD  metoprolol tartrate (LOPRESSOR) 50 MG tablet Take 1 tablet (50 mg total) by mouth 2 (two) times daily. 12/19/21   Wanda Plump, MD   pantoprazole (PROTONIX) 40 MG tablet Take 1 tablet (40 mg total) by mouth at bedtime. Patient taking differently: Take 40 mg by mouth daily. 02/28/22   Arty Baumgartner, NP  polyethylene glycol (MIRALAX / GLYCOLAX) 17 g packet Take 17 g by mouth daily. 07/27/22   Dorcas Carrow, MD  rosuvastatin (CRESTOR) 20 MG tablet Take 1 tablet (20 mg total) by mouth daily. Patient taking differently: Take 20 mg by mouth at bedtime. 03/01/22   Arty Baumgartner, NP  sertraline (ZOLOFT) 25 MG tablet Take 12.5 mg by mouth at bedtime. 06/18/22   [provider]     Family History  Problem Relation Age of Onset   Breast cancer Mother    Heart disease Father        MIs   Colon cancer Other        cousin   Breast cancer Sister    Leukemia Brother        GM    Social History   Socioeconomic History   Marital status: Widowed    Spouse name: Not on file   Number of children: 3   Years of education: Not on file   Highest education level: Not on file  Occupational History   Occupation: n/a  Tobacco Use   Smoking status: Some Days    Packs/day: 1.00    Years: 37.00    Additional pack years: 0.00    Total pack years: 37.00    Types: Cigarettes    Start date: 06/26/1951   Smokeless tobacco: Never   Tobacco comments:    quit 25 years ago  Vaping Use   Vaping Use: Never used  Substance and Sexual Activity   Alcohol use: Not Currently    Alcohol/week: 1.0 standard drink of alcohol    Types: 1 Glasses of wine per week   Drug use: No   Sexual activity: Not Currently  Other Topics Concern   Not on file  Social History Narrative   Lost husband 07/29/07-    Lives in [place]   has 3 daughters, lost Vernona Rieger (03-2017)   Moved to ALF 04-2021   [name] lives in [place], [place] ( [place])     Not driving as of [phone]            Social Determinants of Health   Financial Resource Strain: Low Risk  (08/10/2021)   Overall Financial Resource Strain (CARDIA)    Difficulty of Paying Living  Expenses: Not hard at all  Food Insecurity: No Food Insecurity (  07/24/2022)   Hunger Vital Sign    Worried About Running Out of Food in the Last Year: Never true    Ran Out of Food in the Last Year: Never true  Transportation Needs: No Transportation Needs (07/24/2022)   PRAPARE - Administrator, Civil Service (Medical): No    Lack of Transportation (Non-Medical): No  Physical Activity: Not on file  Stress: Not on file  Social Connections: Not on file    Code Status: Full Code  Review of Systems: A 12 point ROS discussed and pertinent positives are indicated in the HPI above.  All other systems are negative.  Review of Systems  Constitutional:  Negative for chills and fever.  Respiratory:  Negative for chest tightness and shortness of breath.   Cardiovascular:  Negative for chest pain and leg swelling.  Gastrointestinal:  Negative for abdominal pain, diarrhea, nausea and vomiting.  Musculoskeletal:  Positive for back pain.  Neurological:  Negative for dizziness.  Psychiatric/Behavioral:  Negative for confusion.     Vital Signs: BP (!) 147/75 (BP Location: Right Arm)   Pulse 87   Temp 97.9 F (36.6 C)   Resp 18   SpO2 99%   Advance Care Plan: The advanced care plan/surrogate decision maker was discussed at the time of visit and documented in the medical record.  Patient's daughter is preferred surrogate decision maker, patient states daughter is HCPOA  Physical Exam Vitals reviewed.  Neck:     Comments: Patient currently in C-collar for C7 fracture Cardiovascular:     Rate and Rhythm: Normal rate and regular rhythm.     Pulses: Normal pulses.     Heart sounds: Normal heart sounds.  Pulmonary:     Effort: Pulmonary effort is normal.     Breath sounds: Normal breath sounds.     Comments: Pt on 2L of O2 via  Abdominal:     Palpations: Abdomen is soft.     Tenderness: There is no abdominal tenderness.  Musculoskeletal:     Right lower leg: No edema.      Left lower leg: No edema.  Skin:    General: Skin is warm and dry.  Neurological:     Mental Status: She is alert and oriented to person, place, and time.  Psychiatric:        Mood and Affect: Mood normal.        Behavior: Behavior normal.     Imaging: ECHOCARDIOGRAM LIMITED  Result Date: 09/30/2022    ECHOCARDIOGRAM LIMITED REPORT   Patient Name:   HARTLEIGH LOSCH Date of Exam: 09/30/2022 Medical Rec #:  [id number]         Height:       66.0 in Accession #:    [id number]        Weight:       125.7 lb Date of Birth:  [birth year]         BSA:          1.641 m Patient Age:    89 years          BP:           145/65 mmHg Patient Gender: F                 HR:           68 bpm. Exam Location:  Inpatient Procedure: Limited Echo, Cardiac Doppler and Color Doppler Indications:    R01.1 Murmur  History:  Patient has prior history of Echocardiogram examinations, most                 recent 02/24/2022. Cardiomyopathy, CAD, Stroke; Risk                 Factors:Hypertension and Dyslipidemia. SDH. Takotsubo.                 Asymmetric LVH.  Sonographer:    Sheralyn Boatman RDCS Referring Phys: [id number] [facility] M PATEL  Sonographer Comments: Technically difficult study due to poor echo windows. Patient had neck brace in place. Suboptimal parasternal. Patient very sensitive to pressure from probe. IMPRESSIONS  1. No sig LVOT obstruction. Basal/septal hypertrophy can be seen with old age. Unchanged hypokinesis of the inferior/inferolateral/inferoseptal base-mid wall. Left ventricular ejection fraction, by estimation, is 50 to 55%. The left ventricle has low normal function. There is severe asymmetric left ventricular hypertrophy of the basal-septal segment. Left ventricular diastolic parameters are consistent with Grade I diastolic dysfunction (impaired relaxation).  2. No evidence of mitral valve regurgitation.  3. There is moderate calcification of the aortic valve. Aortic valve regurgitation is not visualized. Mild  aortic valve stenosis.  4. The inferior vena cava is normal in size with greater than 50% respiratory variability, suggesting right atrial pressure of 3 mmHg. Comparison(s): No significant change from prior study. FINDINGS  Left Ventricle: No sig LVOT obstruction. Basal/septal hypertrophy can be seen with old age. Unchanged hypokinesis of the inferior/inferolateral/inferoseptal base-mid wall. Left ventricular ejection fraction, by estimation, is 50 to 55%. The left ventricle has low normal function. There is severe asymmetric left ventricular hypertrophy of the basal-septal segment. Left ventricular diastolic parameters are consistent with Grade I diastolic dysfunction (impaired relaxation). Mitral Valve: Mild mitral annular calcification. Tricuspid Valve: Tricuspid valve regurgitation is trivial. Aortic Valve: There is moderate calcification of the aortic valve. Aortic valve regurgitation is not visualized. Mild aortic stenosis is present. Aortic valve mean gradient measures 12.0 mmHg. Aortic valve peak gradient measures 22.1 mmHg. Aortic valve area, by VTI measures 2.43 cm. Aorta: The aortic root and ascending aorta are structurally normal, with no evidence of dilitation. Venous: The inferior vena cava is normal in size with greater than 50% respiratory variability, suggesting right atrial pressure of 3 mmHg. Additional Comments: Spectral Doppler performed. Color Doppler performed.  LEFT VENTRICLE PLAX 2D LVIDd:         3.20 cm LVIDs:         2.30 cm LV PW:         1.10 cm LV IVS:        1.10 cm LVOT diam:     2.20 cm   3D Volume EF: LV SV:         127       3D EF:        55 % LV SV Index:   77        LV EDV:       89 ml LVOT Area:     3.80 cm  LV ESV:       40 ml                          LV SV:        49 ml IVC IVC diam: 2.20 cm LEFT ATRIUM         Index LA diam:    5.00 cm 3.05 cm/m  AORTIC VALVE AV Area (Vmax):    2.31  cm AV Area (Vmean):   2.35 cm AV Area (VTI):     2.43 cm AV Vmax:           235.00 cm/s  AV Vmean:          162.000 cm/s AV VTI:            0.520 m AV Peak Grad:      22.1 mmHg AV Mean Grad:      12.0 mmHg LVOT Vmax:         143.00 cm/s LVOT Vmean:        100.000 cm/s LVOT VTI:          0.333 m LVOT/AV VTI ratio: 0.64  AORTA Ao Root diam: 3.05 cm Ao Asc diam:  2.70 cm MITRAL VALVE MV Area (PHT): 2.69 cm     SHUNTS MV Decel Time: 282 msec     Systemic VTI:  0.33 m MV E velocity: 80.20 cm/s   Systemic Diam: 2.20 cm MV A velocity: 104.00 cm/s MV E/A ratio:  0.77 [place] signed by Carolan Clines Signature Date/Time: 09/30/2022/11:08:46 AM    Final    CT HEAD WO CONTRAST ( )  Result Date: 09/29/2022 CLINICAL DATA:  89 year old female with falls, small right para falcine subdural hematoma. EXAM: CT HEAD WITHOUT CONTRAST TECHNIQUE: Contiguous axial images were obtained from the base of the skull through the vertex without intravenous contrast. RADIATION DOSE REDUCTION: This exam was performed according to the departmental dose-optimization program which includes automated exposure control, adjustment of the mA and/or kV according to patient size and/or use of iterative reconstruction technique. COMPARISON:  Head CT yesterday, 07/24/2022. FINDINGS: Brain: Trace right para falcine subdural blood on series 4, image 27 is unchanged. No new intracranial hemorrhage identified. No midline shift, mass effect, or evidence of intracranial mass lesion. No IVH or ventriculomegaly. Chronic encephalomalacia in the right inferior frontal gyrus, right inferior temporal gyrus. Chronic lacunar infarct of the right thalamus. Patchy and confluent bilateral cerebral white matter hypodensity. Small chronic left cerebellar infarct. No cortically based acute infarct identified. Vascular: Calcified atherosclerosis at the skull base. Intracranial artery tortuosity. No suspicious intracranial vascular hyperdensity. Skull: Stable.  No acute fracture identified. Sinuses/Orbits: Visualized paranasal sinuses and  mastoids are stable and well aerated. Other: No discrete orbit or scalp soft tissue injury. IMPRESSION: 1. Trace right para falcine subdural blood is stable with no intracranial mass effect. 2. No new intracranial abnormality. 3. Chronic ischemic disease, and ischemic versus posttraumatic right frontotemporal encephalomalacia. Electronically Signed   By: Odessa Fleming M.D.   On: 09/29/2022 06:39   CT HEAD WO CONTRAST  Addendum Date: 09/29/2022   ADDENDUM REPORT: 09/29/2022 01:23 ADDENDUM: In retrospect the T7 compression fracture described above, does appear to have been present on thoracic spine plain films from 02/19/2022. There is increased sclerosis of the vertebral body compatible with an interval healing response, but the degree of compression and retropulsion seems unchanged. Electronically Signed   By: Almira Bar M.D.   On: 09/29/2022 01:23   Result Date: 09/29/2022 CLINICAL DATA:  Fall trauma with head trauma, additional blunt poly frequent falls. Trauma. EXAM: CT HEAD WITHOUT CONTRAST CT CERVICAL SPINE WITHOUT CONTRAST CT CHEST, ABDOMEN AND PELVIS WITHOUT CONTRAST TECHNIQUE: Contiguous axial images were obtained from the base of the skull through the vertex without intravenous contrast. Multidetector CT imaging of the cervical spine was performed without intravenous contrast. Multiplanar CT image reconstructions were also generated. Multidetector CT imaging of the chest, abdomen and  pelvis was performed following the standard protocol without IV contrast. RADIATION DOSE REDUCTION: This exam was performed according to the departmental dose-optimization program which includes automated exposure control, adjustment of the mA and/or kV according to patient size and/or use of iterative reconstruction technique. COMPARISON:  Head and cervical spine CT both obtained recently 09/24/2022, CTA chest, abdomen and pelvis 10/01/2021, abdomen and pelvis CT 10/03/2020. FINDINGS: CT HEAD FINDINGS Brain: Newly noted is  a small acute 3 mm thick right parafalcine subdural bleed in the high frontal area. There is no further evidence of intracranial hemorrhage. There is cerebral atrophy with mild atrophic ventriculomegaly, chronic inferior right frontal infarct, and extensive and fairly confluent hypoattenuating small-vessel disease of the cerebral white matter. There is a chronic right thalamic lacunar infarct and a small chronic infarct anteriorly in the left cerebellar hemisphere. No new cortical based infarct is seen, no mass effect or midline shift. Basal cisterns are clear. Vascular: There is calcification distal right vertebral artery and both siphons. No hyperdense central vessel is seen. Skull: There is osteopenia without evidence for depressed fractures. No scalp hematoma is seen. Sinuses/Orbits: Negative orbits. Clear paranasal sinuses with mild septal deviation to the right. Fluid again noted the bilateral mastoid tips, left-greater-than-right, with otherwise clear mastoids. Other: None. CT CERVICAL FINDINGS Alignment: Unchanged. No traumatic listhesis is suspected. A slight degenerative listhesis is again noted at C4-5 and C7-T1. Bone-on-bone anterior atlantodental joint space loss and osteophytosis is again shown. There is no C1-2 offset. Skull base and vertebrae: Generalized osteopenia. There is a chronic mild upper plate anterior wedge compression fracture of the T1 vertebral body. This appears unchanged. At C7, new from 09/24/2022 there is a slight loss of vertebral height of about 15% and mild anterior bulge newly seen in the contour of the anterior vertebral body, findings consistent with a new mild compression fracture. At T3, new since 10/01/2021 there is a compression fracture with up to 30-40% generalized vertebral height loss and mild fragmentation, slight outward spreading of fragments and retropulsion, without cord compromise. Kyphoplasty cement and partial vertebral height loss are unchanged at T4. Soft  tissues and spinal canal: There is edema and swelling around the new T3 compression fracture but no notable swelling at C7. There is no definitive spinal canal hematoma. Soft tissue resolution in the thoracic spine is less than in the cervical spine due to patient's overlying arms. There is moderate calcification in both proximal cervical ICAs. No laryngeal mass or thyroid nodules. Disc levels: Preservation of disc heights except for mild disc space loss at C5-6. There are mild facet joint and uncinate spurring changes at most levels, greater on the left, moderate left foraminal stenosis again noted C3-4, C4-5 and C6-7. No other significant foraminal stenosis. Other: None. CT CHEST FINDINGS Cardiovascular: Mild cardiomegaly. There are three-vessel coronary artery calcifications, aortic tortuosity, and moderate calcific plaques of the aorta and great vessels without aneurysm. Pulmonary arteries and veins appear normal in caliber. Mediastinum/Nodes: Calcified mediastinal and right hilar nodes. No adenopathy is seen without contrast. Moderate hiatal hernia is unchanged. Lungs/Pleura: Small layering pleural effusions new from prior study. No pneumothorax. The main bronchi are clear. There are scarring changes in the apices and bases increased atelectatic change in the left lower lobe versus consolidation. There is diffuse bronchial thickening. No nodules or further new findings. Musculoskeletal: Generalized osteopenia. There is a chronic fracture of the proximal right humerus. There are multiple bilateral chronic healed rib fracture deformities without acute displaced rib fracture. Rudimentary C7 cervical ribs.  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 Kyphoplasty cement is again noted in the T4 T8, T9, T10 and T12 vertebrae. In addition to the new compression fracture described above at T3 there is also a new moderate to severe compression fracture of the T7 vertebral body, with 75-80% loss of the vertebral height in  general, with about 4 mm of posteroinferior cortical retropulsion. This mildly narrows the thecal sac eccentric to the left to 6.3 mm AP. Unchanged treated moderate wedging T8, T9, T10 and T12. No definitive evidence of a canal hematoma but there is increased soft tissue fullness alongside T3 and T7. CT ABDOMEN AND PELVIS FINDINGS Hepatobiliary: Increasingly dense liver measuring 20.6 cm length. Clinical correlation advised for amiodarone or heavy metal toxicity versus iron deposition. There are scattered calcified granulomas without visible mass. Gallbladder is absent with no biliary dilatation. Pancreas: No abnormality. Spleen: Costophrenic granulomas. Otherwise no further abnormality. No adjacent hematoma. Adrenals/Urinary Tract: No significant findings. No calculus. No bladder thickening or contour deforming mass of the kidneys. Stomach/Bowel: No dilatation or wall thickening. Moderate fecal stasis. Diverticulosis without diverticulitis. Vascular/Lymphatic: Heavy aortoiliac calcific plaques with visceral branch vessel atherosclerosis. No adenopathy is seen. Reproductive: Small calcified uterine fibroid. No other significant findings. Other: Mild generalized body wall edema similar. Trace presacral ascites is seen and mild mesenteric congestive features, also similar. There is no free hemorrhage or free air. Musculoskeletal: Since 10/01/2021, interval right hip nailing. There are healed pubic rami fractures. Osteopenia. Four lumbar type vertebrae with chronic wedging and kyphoplasty L1. Mild biconcave appearance of L2 and mild upper plate concavity at L3. Normal height of L5. All unchanged. No regional acute skeletal fracture is seen. IMPRESSION: 1. 3 mm acute small right parafalcine subdural bleed in the high frontal area. No mass effect or midline shift. 2. Osteopenia and degenerative change of the cervical spine without traumatic listhesis. 3. New T3 compression fracture with 30-40% loss of the vertebral  height and mild fragmentation, with slight outward spreading of fragments and mild retropulsion. No cord compromise but there is mild narrowing of the thecal sac. 4. New C7 mild compression fracture with loss of vertebral height of about 15% and mild anterior bulge of the anterior vertebral body. 5. No definitive canal hematoma but there is increased soft tissue fullness alongside T3 and T7. MRI follow-up recommended. Multilevel old kyphoplasty. 6. Small pleural effusions with increased atelectatic change versus consolidation in the left lower lobe. Diffuse bronchitis. 7. Cardiomegaly with calcific CAD, aortic and branch vessel atherosclerosis. 8. Increasingly dense liver. Clinical correlation advised for amiodarone or heavy metal toxicity versus iron deposition. 9. No acute trauma related findings in the abdomen or pelvis. 10. Chronically noted mesenteric congestion and mild body wall edema. Trace ascites. 11. Constipation and diverticulosis. 12. Interval right hip nailing since last year. 13. Critical Value/emergent results were called by telephone at the time of interpretation on 09/28/2022 at 11:49 pm to provider Dr. Madilyn Hook, who verbally acknowledged these results. Aortic Atherosclerosis (ICD10-I70.0). Electronically Signed: By: Almira Bar M.D. On: 09/29/2022 00:36   CT CERVICAL SPINE WO CONTRAST  Addendum Date: 09/29/2022   ADDENDUM REPORT: 09/29/2022 01:23 ADDENDUM: In retrospect the T7 compression fracture described above, does appear to have been present on thoracic spine plain films from 02/19/2022. There is increased sclerosis of the vertebral body compatible with an interval healing response, but the degree of compression and retropulsion seems unchanged. Electronically Signed   By: Almira Bar M.D.   On: 09/29/2022 01:23   Result Date:  09/29/2022 CLINICAL DATA:  Fall trauma with head trauma, additional blunt poly frequent falls. Trauma. EXAM: CT HEAD WITHOUT CONTRAST CT CERVICAL SPINE WITHOUT  CONTRAST CT CHEST, ABDOMEN AND PELVIS WITHOUT CONTRAST TECHNIQUE: Contiguous axial images were obtained from the base of the skull through the vertex without intravenous contrast. Multidetector CT imaging of the cervical spine was performed without intravenous contrast. Multiplanar CT image reconstructions were also generated. Multidetector CT imaging of the chest, abdomen and pelvis was performed following the standard protocol without IV contrast. RADIATION DOSE REDUCTION: This exam was performed according to the departmental dose-optimization program which includes automated exposure control, adjustment of the mA and/or kV according to patient size and/or use of iterative reconstruction technique. COMPARISON:  Head and cervical spine CT both obtained recently 09/24/2022, CTA chest, abdomen and pelvis 10/01/2021, abdomen and pelvis CT 10/03/2020. FINDINGS: CT HEAD FINDINGS Brain: Newly noted is a small acute 3 mm thick right parafalcine subdural bleed in the high frontal area. There is no further evidence of intracranial hemorrhage. There is cerebral atrophy with mild atrophic ventriculomegaly, chronic inferior right frontal infarct, and extensive and fairly confluent hypoattenuating small-vessel disease of the cerebral white matter. There is a chronic right thalamic lacunar infarct and a small chronic infarct anteriorly in the left cerebellar hemisphere. No new cortical based infarct is seen, no mass effect or midline shift. Basal cisterns are clear. Vascular: There is calcification distal right vertebral artery and both siphons. No hyperdense central vessel is seen. Skull: There is osteopenia without evidence for depressed fractures. No scalp hematoma is seen. Sinuses/Orbits: Negative orbits. Clear paranasal sinuses with mild septal deviation to the right. Fluid again noted the bilateral mastoid tips, left-greater-than-right, with otherwise clear mastoids. Other: None. CT CERVICAL FINDINGS Alignment: Unchanged.  No traumatic listhesis is suspected. A slight degenerative listhesis is again noted at C4-5 and C7-T1. Bone-on-bone anterior atlantodental joint space loss and osteophytosis is again shown. There is no C1-2 offset. Skull base and vertebrae: Generalized osteopenia. There is a chronic mild upper plate anterior wedge compression fracture of the T1 vertebral body. This appears unchanged. At C7, new from 09/24/2022 there is a slight loss of vertebral height of about 15% and mild anterior bulge newly seen in the contour of the anterior vertebral body, findings consistent with a new mild compression fracture. At T3, new since 10/01/2021 there is a compression fracture with up to 30-40% generalized vertebral height loss and mild fragmentation, slight outward spreading of fragments and retropulsion, without cord compromise. Kyphoplasty cement and partial vertebral height loss are unchanged at T4. Soft tissues and spinal canal: There is edema and swelling around the new T3 compression fracture but no notable swelling at C7. There is no definitive spinal canal hematoma. Soft tissue resolution in the thoracic spine is less than in the cervical spine due to patient's overlying arms. There is moderate calcification in both proximal cervical ICAs. No laryngeal mass or thyroid nodules. Disc levels: Preservation of disc heights except for mild disc space loss at C5-6. There are mild facet joint and uncinate spurring changes at most levels, greater on the left, moderate left foraminal stenosis again noted C3-4, C4-5 and C6-7. No other significant foraminal stenosis. Other: None. CT CHEST FINDINGS Cardiovascular: Mild cardiomegaly. There are three-vessel coronary artery calcifications, aortic tortuosity, and moderate calcific plaques of the aorta and great vessels without aneurysm. Pulmonary arteries and veins appear normal in caliber. Mediastinum/Nodes: Calcified mediastinal and right hilar nodes. No adenopathy is seen without  contrast. Moderate hiatal hernia is  unchanged. Lungs/Pleura: Small layering pleural effusions new from prior study. No pneumothorax. The main bronchi are clear. There are scarring changes in the apices and bases increased atelectatic change in the left lower lobe versus consolidation. There is diffuse bronchial thickening. No nodules or further new findings. Musculoskeletal: Generalized osteopenia. There is a chronic fracture of the proximal right humerus. There are multiple bilateral chronic healed rib fracture deformities without acute displaced rib fracture. Rudimentary C7 cervical ribs.  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 Kyphoplasty cement is again noted in the T4 T8, T9, T10 and T12 vertebrae. In addition to the new compression fracture described above at T3 there is also a new moderate to severe compression fracture of the T7 vertebral body, with 75-80% loss of the vertebral height in general, with about 4 mm of posteroinferior cortical retropulsion. This mildly narrows the thecal sac eccentric to the left to 6.3 mm AP. Unchanged treated moderate wedging T8, T9, T10 and T12. No definitive evidence of a canal hematoma but there is increased soft tissue fullness alongside T3 and T7. CT ABDOMEN AND PELVIS FINDINGS Hepatobiliary: Increasingly dense liver measuring 20.6 cm length. Clinical correlation advised for amiodarone or heavy metal toxicity versus iron deposition. There are scattered calcified granulomas without visible mass. Gallbladder is absent with no biliary dilatation. Pancreas: No abnormality. Spleen: Costophrenic granulomas. Otherwise no further abnormality. No adjacent hematoma. Adrenals/Urinary Tract: No significant findings. No calculus. No bladder thickening or contour deforming mass of the kidneys. Stomach/Bowel: No dilatation or wall thickening. Moderate fecal stasis. Diverticulosis without diverticulitis. Vascular/Lymphatic: Heavy aortoiliac calcific plaques with visceral branch vessel  atherosclerosis. No adenopathy is seen. Reproductive: Small calcified uterine fibroid. No other significant findings. Other: Mild generalized body wall edema similar. Trace presacral ascites is seen and mild mesenteric congestive features, also similar. There is no free hemorrhage or free air. Musculoskeletal: Since 10/01/2021, interval right hip nailing. There are healed pubic rami fractures. Osteopenia. Four lumbar type vertebrae with chronic wedging and kyphoplasty L1. Mild biconcave appearance of L2 and mild upper plate concavity at L3. Normal height of L5. All unchanged. No regional acute skeletal fracture is seen. IMPRESSION: 1. 3 mm acute small right parafalcine subdural bleed in the high frontal area. No mass effect or midline shift. 2. Osteopenia and degenerative change of the cervical spine without traumatic listhesis. 3. New T3 compression fracture with 30-40% loss of the vertebral height and mild fragmentation, with slight outward spreading of fragments and mild retropulsion. No cord compromise but there is mild narrowing of the thecal sac. 4. New C7 mild compression fracture with loss of vertebral height of about 15% and mild anterior bulge of the anterior vertebral body. 5. No definitive canal hematoma but there is increased soft tissue fullness alongside T3 and T7. MRI follow-up recommended. Multilevel old kyphoplasty. 6. Small pleural effusions with increased atelectatic change versus consolidation in the left lower lobe. Diffuse bronchitis. 7. Cardiomegaly with calcific CAD, aortic and branch vessel atherosclerosis. 8. Increasingly dense liver. Clinical correlation advised for amiodarone or heavy metal toxicity versus iron deposition. 9. No acute trauma related findings in the abdomen or pelvis. 10. Chronically noted mesenteric congestion and mild body wall edema. Trace ascites. 11. Constipation and diverticulosis. 12. Interval right hip nailing since last year. 13. Critical Value/emergent results  were called by telephone at the time of interpretation on 09/28/2022 at 11:49 pm to provider Dr. Madilyn Hook, who verbally acknowledged these results. Aortic Atherosclerosis (ICD10-I70.0). Electronically Signed: By: Almira Bar M.D. On: 09/29/2022 00:36  CT CHEST ABDOMEN PELVIS WO CONTRAST  Addendum Date: 09/29/2022   ADDENDUM REPORT: 09/29/2022 01:23 ADDENDUM: In retrospect the T7 compression fracture described above, does appear to have been present on thoracic spine plain films from 02/19/2022. There is increased sclerosis of the vertebral body compatible with an interval healing response, but the degree of compression and retropulsion seems unchanged. Electronically Signed   By: Almira Bar M.D.   On: 09/29/2022 01:23   Result Date: 09/29/2022 CLINICAL DATA:  Fall trauma with head trauma, additional blunt poly frequent falls. Trauma. EXAM: CT HEAD WITHOUT CONTRAST CT CERVICAL SPINE WITHOUT CONTRAST CT CHEST, ABDOMEN AND PELVIS WITHOUT CONTRAST TECHNIQUE: Contiguous axial images were obtained from the base of the skull through the vertex without intravenous contrast. Multidetector CT imaging of the cervical spine was performed without intravenous contrast. Multiplanar CT image reconstructions were also generated. Multidetector CT imaging of the chest, abdomen and pelvis was performed following the standard protocol without IV contrast. RADIATION DOSE REDUCTION: This exam was performed according to the departmental dose-optimization program which includes automated exposure control, adjustment of the mA and/or kV according to patient size and/or use of iterative reconstruction technique. COMPARISON:  Head and cervical spine CT both obtained recently 09/24/2022, CTA chest, abdomen and pelvis 10/01/2021, abdomen and pelvis CT 10/03/2020. FINDINGS: CT HEAD FINDINGS Brain: Newly noted is a small acute 3 mm thick right parafalcine subdural bleed in the high frontal area. There is no further evidence of  intracranial hemorrhage. There is cerebral atrophy with mild atrophic ventriculomegaly, chronic inferior right frontal infarct, and extensive and fairly confluent hypoattenuating small-vessel disease of the cerebral white matter. There is a chronic right thalamic lacunar infarct and a small chronic infarct anteriorly in the left cerebellar hemisphere. No new cortical based infarct is seen, no mass effect or midline shift. Basal cisterns are clear. Vascular: There is calcification distal right vertebral artery and both siphons. No hyperdense central vessel is seen. Skull: There is osteopenia without evidence for depressed fractures. No scalp hematoma is seen. Sinuses/Orbits: Negative orbits. Clear paranasal sinuses with mild septal deviation to the right. Fluid again noted the bilateral mastoid tips, left-greater-than-right, with otherwise clear mastoids. Other: None. CT CERVICAL FINDINGS Alignment: Unchanged. No traumatic listhesis is suspected. A slight degenerative listhesis is again noted at C4-5 and C7-T1. Bone-on-bone anterior atlantodental joint space loss and osteophytosis is again shown. There is no C1-2 offset. Skull base and vertebrae: Generalized osteopenia. There is a chronic mild upper plate anterior wedge compression fracture of the T1 vertebral body. This appears unchanged. At C7, new from 09/24/2022 there is a slight loss of vertebral height of about 15% and mild anterior bulge newly seen in the contour of the anterior vertebral body, findings consistent with a new mild compression fracture. At T3, new since 10/01/2021 there is a compression fracture with up to 30-40% generalized vertebral height loss and mild fragmentation, slight outward spreading of fragments and retropulsion, without cord compromise. Kyphoplasty cement and partial vertebral height loss are unchanged at T4. Soft tissues and spinal canal: There is edema and swelling around the new T3 compression fracture but no notable swelling at  C7. There is no definitive spinal canal hematoma. Soft tissue resolution in the thoracic spine is less than in the cervical spine due to patient's overlying arms. There is moderate calcification in both proximal cervical ICAs. No laryngeal mass or thyroid nodules. Disc levels: Preservation of disc heights except for mild disc space loss at C5-6. There are mild facet joint and  uncinate spurring changes at most levels, greater on the left, moderate left foraminal stenosis again noted C3-4, C4-5 and C6-7. No other significant foraminal stenosis. Other: None. CT CHEST FINDINGS Cardiovascular: Mild cardiomegaly. There are three-vessel coronary artery calcifications, aortic tortuosity, and moderate calcific plaques of the aorta and great vessels without aneurysm. Pulmonary arteries and veins appear normal in caliber. Mediastinum/Nodes: Calcified mediastinal and right hilar nodes. No adenopathy is seen without contrast. Moderate hiatal hernia is unchanged. Lungs/Pleura: Small layering pleural effusions new from prior study. No pneumothorax. The main bronchi are clear. There are scarring changes in the apices and bases increased atelectatic change in the left lower lobe versus consolidation. There is diffuse bronchial thickening. No nodules or further new findings. Musculoskeletal: Generalized osteopenia. There is a chronic fracture of the proximal right humerus. There are multiple bilateral chronic healed rib fracture deformities without acute displaced rib fracture. Rudimentary C7 cervical ribs.  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 Kyphoplasty cement is again noted in the T4 T8, T9, T10 and T12 vertebrae. In addition to the new compression fracture described above at T3 there is also a new moderate to severe compression fracture of the T7 vertebral body, with 75-80% loss of the vertebral height in general, with about 4 mm of posteroinferior cortical retropulsion. This mildly narrows the thecal sac eccentric to the left  to 6.3 mm AP. Unchanged treated moderate wedging T8, T9, T10 and T12. No definitive evidence of a canal hematoma but there is increased soft tissue fullness alongside T3 and T7. CT ABDOMEN AND PELVIS FINDINGS Hepatobiliary: Increasingly dense liver measuring 20.6 cm length. Clinical correlation advised for amiodarone or heavy metal toxicity versus iron deposition. There are scattered calcified granulomas without visible mass. Gallbladder is absent with no biliary dilatation. Pancreas: No abnormality. Spleen: Costophrenic granulomas. Otherwise no further abnormality. No adjacent hematoma. Adrenals/Urinary Tract: No significant findings. No calculus. No bladder thickening or contour deforming mass of the kidneys. Stomach/Bowel: No dilatation or wall thickening. Moderate fecal stasis. Diverticulosis without diverticulitis. Vascular/Lymphatic: Heavy aortoiliac calcific plaques with visceral branch vessel atherosclerosis. No adenopathy is seen. Reproductive: Small calcified uterine fibroid. No other significant findings. Other: Mild generalized body wall edema similar. Trace presacral ascites is seen and mild mesenteric congestive features, also similar. There is no free hemorrhage or free air. Musculoskeletal: Since 10/01/2021, interval right hip nailing. There are healed pubic rami fractures. Osteopenia. Four lumbar type vertebrae with chronic wedging and kyphoplasty L1. Mild biconcave appearance of L2 and mild upper plate concavity at L3. Normal height of L5. All unchanged. No regional acute skeletal fracture is seen. IMPRESSION: 1. 3 mm acute small right parafalcine subdural bleed in the high frontal area. No mass effect or midline shift. 2. Osteopenia and degenerative change of the cervical spine without traumatic listhesis. 3. New T3 compression fracture with 30-40% loss of the vertebral height and mild fragmentation, with slight outward spreading of fragments and mild retropulsion. No cord compromise but there is  mild narrowing of the thecal sac. 4. New C7 mild compression fracture with loss of vertebral height of about 15% and mild anterior bulge of the anterior vertebral body. 5. No definitive canal hematoma but there is increased soft tissue fullness alongside T3 and T7. MRI follow-up recommended. Multilevel old kyphoplasty. 6. Small pleural effusions with increased atelectatic change versus consolidation in the left lower lobe. Diffuse bronchitis. 7. Cardiomegaly with calcific CAD, aortic and branch vessel atherosclerosis. 8. Increasingly dense liver. Clinical correlation advised for amiodarone or heavy metal toxicity versus  iron deposition. 9. No acute trauma related findings in the abdomen or pelvis. 10. Chronically noted mesenteric congestion and mild body wall edema. Trace ascites. 11. Constipation and diverticulosis. 12. Interval right hip nailing since last year. 13. Critical Value/emergent results were called by telephone at the time of interpretation on 09/28/2022 at 11:49 pm to provider Dr. Madilyn Hook, who verbally acknowledged these results. Aortic Atherosclerosis (ICD10-I70.0). Electronically Signed: By: Almira Bar M.D. On: 09/29/2022 00:36   CT L-SPINE NO CHARGE  Result Date: 09/29/2022 CLINICAL DATA:  Back trauma.  Level 2 fall on blood thinners. EXAM: CT THORACIC AND LUMBAR SPINE WITHOUT CONTRAST TECHNIQUE: Multidetector CT imaging of the thoracic and lumbar spine was performed without contrast. Multiplanar CT image reconstructions were also generated. RADIATION DOSE REDUCTION: This exam was performed according to the departmental dose-optimization program which includes automated exposure control, adjustment of the mA and/or kV according to patient size and/or use of iterative reconstruction technique. COMPARISON:  Thoracic spine radiographs 02/19/2022 and CT thoracic and lumbar spine 10/01/2021 FINDINGS: CT THORACIC SPINE FINDINGS Alignment: No evidence of traumatic malalignment. Accentuated thoracic  kyphosis centered at T7-T8. Vertebrae: Status post vertebroplasty at T4, T8, T9, and T12. There is new superior endplate compression of T3 when compared with 02/19/2022 with mildly displaced right anterior superior corner fracture (series 3/image [id number]). Unchanged superior endplate compression of T1 where it is mild, T7 where it is advanced with unchanged retropulsion of the posterior endplate. Extrusion vertebroplasty material through the superior endplate of T12 with indentation of the inferior endplate of T11, unchanged from prior radiograph. Paraspinal and other soft tissues: Unremarkable. See separate report for findings in the chest, abdomen, and pelvis. Disc levels: Multilevel spondylosis, disc space height loss, and degenerative endplate changes. Osteopenia. No severe spinal canal narrowing. Retropulsion of the posterior vertebral body walls at T3, T7, T8, T9, T10, and T12 cause up to mild effacement of the ventral thecal sac. CT LUMBAR SPINE FINDINGS Segmentation: Transitional anatomy. Absent ribs at T12. Fully sacralized L5. Careful attention to numbering is recommended prior to any intervention. Alignment: No evidence of traumatic malalignment. Vertebrae: L1 vertebroplasty. Superior endplate compression fractures are unchanged in L2 and L3. No evidence of acute fracture. Paraspinal and other soft tissues: Negative. See separate report for findings in the abdomen and pelvis. Disc levels: Multilevel spondylosis, disc space height loss, degenerative endplate changes with vacuum phenomenon. Multilevel moderate-advanced facet arthropathy. Inferior endplate retropulsion at L1 causes mild effacement of the ventral thecal sac. No high-grade spinal canal. IMPRESSION: CT THORACIC SPINE IMPRESSION. 1. Age indeterminate superior endplate compression fracture of T3 with 40% vertebral body height loss and mildly displaced right anterior superior corner fracture. This is new since thoracic spine radiographs 02/19/2022 and  may be acute. CT LUMBAR SPINE IMPRESSION. 1. No acute fracture in the lumbar spine. Unchanged superior endplate compression fractures at L2 and L3. 2. Transitional anatomy with complete sacralized L5. Careful attention to numbering is recommended prior to any intervention. Same day CT chest, abdomen, pelvis reported separately. Electronically Signed   By: Minerva Fester M.D.   On: 09/29/2022 00:05   CT T-SPINE NO CHARGE  Result Date: 09/29/2022 CLINICAL DATA:  Back trauma.  Level 2 fall on blood thinners. EXAM: CT THORACIC AND LUMBAR SPINE WITHOUT CONTRAST TECHNIQUE: Multidetector CT imaging of the thoracic and lumbar spine was performed without contrast. Multiplanar CT image reconstructions were also generated. RADIATION DOSE REDUCTION: This exam was performed according to the departmental dose-optimization program which includes automated exposure control, adjustment of  the mA and/or kV according to patient size and/or use of iterative reconstruction technique. COMPARISON:  Thoracic spine radiographs 02/19/2022 and CT thoracic and lumbar spine 10/01/2021 FINDINGS: CT THORACIC SPINE FINDINGS Alignment: No evidence of traumatic malalignment. Accentuated thoracic kyphosis centered at T7-T8. Vertebrae: Status post vertebroplasty at T4, T8, T9, and T12. There is new superior endplate compression of T3 when compared with 02/19/2022 with mildly displaced right anterior superior corner fracture (series 3/image 57). Unchanged superior endplate compression of T1 where it is mild, T7 where it is advanced with unchanged retropulsion of the posterior endplate. Extrusion vertebroplasty material through the superior endplate of T12 with indentation of the inferior endplate of T11, unchanged from prior radiograph. Paraspinal and other soft tissues: Unremarkable. See separate report for findings in the chest, abdomen, and pelvis. Disc levels: Multilevel spondylosis, disc space height loss, and degenerative endplate changes.  Osteopenia. No severe spinal canal narrowing. Retropulsion of the posterior vertebral body walls at T3, T7, T8, T9, T10, and T12 cause up to mild effacement of the ventral thecal sac. CT LUMBAR SPINE FINDINGS Segmentation: Transitional anatomy. Absent ribs at T12. Fully sacralized L5. Careful attention to numbering is recommended prior to any intervention. Alignment: No evidence of traumatic malalignment. Vertebrae: L1 vertebroplasty. Superior endplate compression fractures are unchanged in L2 and L3. No evidence of acute fracture. Paraspinal and other soft tissues: Negative. See separate report for findings in the abdomen and pelvis. Disc levels: Multilevel spondylosis, disc space height loss, degenerative endplate changes with vacuum phenomenon. Multilevel moderate-advanced facet arthropathy. Inferior endplate retropulsion at L1 causes mild effacement of the ventral thecal sac. No high-grade spinal canal. IMPRESSION: CT THORACIC SPINE IMPRESSION. 1. Age indeterminate superior endplate compression fracture of T3 with 40% vertebral body height loss and mildly displaced right anterior superior corner fracture. This is new since thoracic spine radiographs 02/19/2022 and may be acute. CT LUMBAR SPINE IMPRESSION. 1. No acute fracture in the lumbar spine. Unchanged superior endplate compression fractures at L2 and L3. 2. Transitional anatomy with complete sacralized L5. Careful attention to numbering is recommended prior to any intervention. Same day CT chest, abdomen, pelvis reported separately. Electronically Signed   By: Minerva Fester M.D.   On: 09/29/2022 00:05   DG Pelvis Portable  Result Date: 09/28/2022 CLINICAL DATA:  Trauma fall EXAM: PORTABLE PELVIS 1-2 VIEWS COMPARISON:  05/02/2021, 07/23/2022 FINDINGS: SI joints are non widened. Pubic symphysis appears intact. Old appearing left inferior pubic ramus fracture. Remote left femoral orthopedic hardware. Interval intramedullary rodding of comminuted right  intertrochanteric fracture. No definite acute osseous abnormality. IMPRESSION: 1. No acute osseous abnormality. 2. Interval intramedullary rodding of comminuted right intertrochanteric fracture. Electronically Signed   By: Jasmine Pang M.D.   On: 09/28/2022 23:19   DG Chest Port 1 View  Result Date: 09/28/2022 CLINICAL DATA:  Trauma fall EXAM: PORTABLE CHEST 1 VIEW COMPARISON:  07/23/2022 FINDINGS: Mild diffuse interstitial opacity likely chronic disease. Negative for pneumothorax. Healing right proximal humerus fracture with callus. Multiple treated vertebral compression deformities. Multiple remote right rib fractures. Hiatal hernia contributing to retrocardiac opacity. IMPRESSION: 1. Negative for pneumothorax or pleural effusion. 2. Healing right proximal humerus fracture with callus. 3. Hiatal hernia. Electronically Signed   By: Jasmine Pang M.D.   On: 09/28/2022 23:18    Labs:  CBC: Recent Labs    09/29/22 0219 09/30/22 0445 10/01/22 0248 10/02/22 0725  WBC 14.3* 10.9* 8.8 9.3  HGB 11.9* 12.4 11.9* 12.7  HCT 40.1 41.5 39.8 42.2  PLT 398 390  363 438*    COAGS: Recent Labs    02/23/22 1507 09/28/22 2256  INR 1.1 1.4*  APTT 34  --     BMP: Recent Labs    09/29/22 1102 09/30/22 0445 10/01/22 0248 10/02/22 0725  NA 134* 131* 131* 132*  K 3.7 3.5 3.4* 4.0  CL 96* 94* 94* 95*  CO2 30 28 30 30   GLUCOSE 153* 130* 113* 126*  BUN 10 11 11 9   CALCIUM 8.4* 8.1* 7.8* 8.5*  CREATININE 0.57 0.54 0.54 0.57  GFRNONAA >60 >60 >60 >60    LIVER FUNCTION TESTS: Recent Labs    02/23/22 1507 02/23/22 1741 06/20/22 1154 09/28/22 2256  BILITOT 0.9 0.9 1.4* 1.2  AST 22 20 24 24   ALT 10 8 14 19   ALKPHOS 101 76 49 78  PROT 6.0* 4.4* 7.4 6.6  ALBUMIN 3.3* 2.5* 4.3 3.4*    TUMOR MARKERS: No results for input(s): "AFPTM", "CEA", "CA199", "CHROMGRNA" in the last 8760 hours.  Assessment and Plan:   C7, T3 compression fractures Patient admitted after fall at ALF resulting in  C7 and T3 compression fractures.  Patient has undergone prior VP/KP with significant pain relief.  IR consulted for VP/KP of current fractures due to severe pain limiting mobility and requiring frequent IV pain medication.  Case reviewed by Dr. Corliss Skains.  The C7 fracture is not amenable to VP/KP due to increased risk with procedure, however the T3 fracture can be treated with VP/KP Anesthesia has been booked for procedure time at 1430 on 5/23  Risks and benefits of T3 kyphoplasty were discussed with the patient including, but not limited to education regarding the natural healing process of compression fractures without intervention, bleeding, infection, cement migration which may cause spinal cord damage, paralysis, pulmonary embolism or even death.  This interventional procedure involves the use of X-rays and because of the nature of the planned procedure, it is possible that we will have prolonged use of X-ray fluoroscopy.  Potential radiation risks to you include (but are not limited to) the following: - A slightly elevated risk for cancer  several years later in life. This risk is typically less than 0.5% percent. This risk is low in comparison to the normal incidence of human cancer, which is 33% for women and 50% for men according to the [employer]. - Radiation induced injury can include skin redness, resembling a rash, tissue breakdown / ulcers and hair loss (which can be temporary or permanent).   The likelihood of either of these occurring depends on the difficulty of the procedure and whether you are sensitive to radiation due to previous procedures, disease, or genetic conditions.   IF your procedure requires a prolonged use of radiation, you will be notified and given written instructions for further action.  It is your responsibility to monitor the irradiated area for the 2 weeks following the procedure and to notify your physician if you are concerned that you have  suffered a radiation induced injury.    All of the patient's questions were answered, patient is agreeable to proceed.  Consent signed and in IR suite.   Thank you for this interesting consult.  I greatly enjoyed meeting BATINA LOHREY and look forward to participating in their care.  A copy of this report was sent to the requesting provider on this date.  Electronically Signed: Kennieth Francois, PA-C 10/03/2022, 4:12 PM   I spent a total of 20 Minutes  in face to face in clinical  consultation, greater than 50% of which was counseling/coordinating care for C7, T3 compression fracture

## 2022-10-03 NOTE — Progress Notes (Signed)
PT Cancellation Note  Patient Details Name: [name] MRN: [id number] DOB: 05-22-1929   Cancelled Treatment:    Reason Eval/Treat Not Completed: Pain limiting ability to participate. Pt declining PT due to pain. Pt appears confused today, with tangential nonsensical speech. She is scheduled to undergo kyphoplasty tomorrow. Hopefully the procedure will help with pain management. PT to follow up after procedure.   Ilda Foil 10/03/2022, 10:05 AM

## 2022-10-04 ENCOUNTER — Other Ambulatory Visit: Payer: Self-pay

## 2022-10-04 ENCOUNTER — Encounter (HOSPITAL_COMMUNITY): Payer: Self-pay

## 2022-10-04 ENCOUNTER — Inpatient Hospital Stay (HOSPITAL_COMMUNITY): Payer: Medicare Other | Admitting: Anesthesiology

## 2022-10-04 ENCOUNTER — Other Ambulatory Visit (HOSPITAL_COMMUNITY): Payer: Self-pay

## 2022-10-04 ENCOUNTER — Inpatient Hospital Stay (HOSPITAL_COMMUNITY): Payer: Medicare Other

## 2022-10-04 ENCOUNTER — Encounter (HOSPITAL_COMMUNITY)
Admission: EM | Disposition: A | Discharge status: Skilled Nursing Facility | Payer: Self-pay | Source: Skilled Nursing Facility | Attending: Student

## 2022-10-04 DIAGNOSIS — S22030A Wedge compression fracture of third thoracic vertebra, initial encounter for closed fracture: Secondary | ICD-10-CM | POA: Diagnosis not present

## 2022-10-04 DIAGNOSIS — F17210 Nicotine dependence, cigarettes, uncomplicated: Secondary | ICD-10-CM

## 2022-10-04 DIAGNOSIS — I2510 Atherosclerotic heart disease of native coronary artery without angina pectoris: Secondary | ICD-10-CM

## 2022-10-04 DIAGNOSIS — W19XXXD Unspecified fall, subsequent encounter: Secondary | ICD-10-CM

## 2022-10-04 DIAGNOSIS — C50011 Malignant neoplasm of nipple and areola, right female breast: Secondary | ICD-10-CM | POA: Diagnosis not present

## 2022-10-04 DIAGNOSIS — I2699 Other pulmonary embolism without acute cor pulmonale: Secondary | ICD-10-CM | POA: Diagnosis not present

## 2022-10-04 DIAGNOSIS — I10 Essential (primary) hypertension: Secondary | ICD-10-CM | POA: Diagnosis not present

## 2022-10-04 DIAGNOSIS — S065XAA Traumatic subdural hemorrhage with loss of consciousness status unknown, initial encounter: Secondary | ICD-10-CM | POA: Diagnosis not present

## 2022-10-04 HISTORY — PX: RADIOLOGY WITH ANESTHESIA: SHX6223

## 2022-10-04 LAB — SURGICAL PCR SCREEN
MRSA, PCR: NEGATIVE
Staphylococcus aureus: NEGATIVE

## 2022-10-04 SURGERY — IR WITH ANESTHESIA
Anesthesia: Monitor Anesthesia Care

## 2022-10-04 MED ORDER — LACTATED RINGERS IV SOLN: INTRAVENOUS | Status: DC

## 2022-10-04 MED ORDER — FENTANYL CITRATE (PF) 100 MCG/2ML IJ SOLN
INTRAMUSCULAR | Status: AC
  Filled 2022-10-04: qty 2

## 2022-10-04 MED ORDER — CHLORHEXIDINE GLUCONATE 0.12 % MT SOLN
15.0000 mL | Freq: Once | OROMUCOSAL | Status: AC
  Administered 2022-10-04: 15 mL via OROMUCOSAL
  Filled 2022-10-04 (×2): qty 15

## 2022-10-04 MED ORDER — ORAL CARE MOUTH RINSE: 15.0000 mL | Freq: Once | OROMUCOSAL | Status: AC

## 2022-10-04 MED ORDER — FENTANYL CITRATE PF 50 MCG/ML IJ SOSY
25.0000 ug | PREFILLED_SYRINGE | Freq: Once | INTRAMUSCULAR | Status: AC
  Administered 2022-10-04: 25 ug via INTRAVENOUS
  Filled 2022-10-04: qty 1

## 2022-10-04 MED ORDER — FENTANYL CITRATE (PF) 250 MCG/5ML IJ SOLN
INTRAMUSCULAR | Status: DC | PRN
  Administered 2022-10-04: 25 ug via INTRAVENOUS

## 2022-10-04 NOTE — Progress Notes (Signed)
Patient ID: [name], female   DOB: [birth year], 89 y.o.   MRN: [id number] INR Patient brought to the angio suite for planned T3 vertebroplasty.    Procedure abandoned as patient experienced significant discomfort and pain in the upper thoracic and lower cervical region whilst being transferred onto the fluoroscopic table to be positioned prone.   Also noted significant drop in O2 saturation when positioned  half prone despite  adequate O2 via nasal cannulae.  Patient expressed that she did not want to proceed with the procedure.  Fatima Sanger MD

## 2022-10-04 NOTE — Anesthesia Postprocedure Evaluation (Signed)
Anesthesia Post Note  Patient: [name]  Procedure(s) Performed: Procedure Aborted.  No intervention.     Patient location during evaluation: PACU Anesthesia Type: MAC Level of consciousness: awake and alert Pain management: pain level controlled Vital Signs Assessment: post-procedure vital signs reviewed and stable Respiratory status: spontaneous breathing, nonlabored ventilation, respiratory function stable and patient connected to nasal cannula oxygen Cardiovascular status: stable and blood pressure returned to baseline Postop Assessment: no apparent nausea or vomiting Anesthetic complications: no Comments: Aborted procedure.  Patient refused the procedure while attempting for position herself on the procedure bed.   No notable events documented.  Last Vitals:  Vitals:   10/04/22 1604 10/04/22 1718  BP: (!) 151/79 (!) 166/75  Pulse: 83 84  Resp: 18   Temp: 36.7 C (!) 36.4 C  SpO2: 97% 99%    Last Pain:  Vitals:   10/04/22 1612  TempSrc:   PainSc: 10-Worst pain ever                 Carnetta Losada S

## 2022-10-04 NOTE — Anesthesia Preprocedure Evaluation (Addendum)
Anesthesia Evaluation  Patient identified by MRN, date of birth, ID band Patient awake    Reviewed: Allergy & Precautions, H&P , NPO status , Patient's Chart, lab work & pertinent test results  History of Anesthesia Complications Negative for: history of anesthetic complications  Airway Mallampati: II   Neck ROM: Limited    Dental   Pulmonary sleep apnea , Current Smoker   breath sounds clear to auscultation       Cardiovascular hypertension, Pt. on medications and Pt. on home beta blockers + CAD, + Past MI, + Cardiac Stents and + DVT   Rhythm:regular Rate:Normal   Hx Takotsubo cardiomyopathy  '24 TTE - EF 50-55%. No sig LVOT obstruction. Basal/septal hypertrophy can be seen with old age. Unchanged hypokinesis of the inferior/inferolateral/inferoseptal base-mid wall. There is severe asymmetric left ventricular hypertrophy of the basal-septal segment. Grade I diastolic dysfunction (impaired relaxation). Mild aortic valve stenosis.     Neuro/Psych  Headaches PSYCHIATRIC DISORDERS Anxiety Depression     SDH C7 and T3 fractures   Neuromuscular disease CVA  C-spine not cleared    GI/Hepatic Neg liver ROS, hiatal hernia,GERD  Medicated,,  Endo/Other   Na 132   Renal/GU negative Renal ROS     Musculoskeletal  (+) Arthritis , Osteoarthritis,    Abdominal   Peds  Hematology  On plavix    Anesthesia Other Findings   Reproductive/Obstetrics                             Anesthesia Physical Anesthesia Plan  ASA: 3  Anesthesia Plan: MAC   Post-op Pain Management: Minimal or no pain anticipated   Induction: Intravenous  PONV Risk Score and Plan: 3 and Treatment may vary due to age or medical condition, Ondansetron and Propofol infusion  Airway Management Planned: Simple Face Mask  Additional Equipment: None  Intra-op Plan:   Post-operative Plan:   Informed Consent: I have reviewed  the patients History and Physical, chart, labs and discussed the procedure including the risks, benefits and alternatives for the proposed anesthesia with the patient or authorized representative who has indicated his/her understanding and acceptance.     Dental advisory given  Plan Discussed with: CRNA, Anesthesiologist and Surgeon  Anesthesia Plan Comments:         Anesthesia Quick Evaluation

## 2022-10-04 NOTE — Progress Notes (Signed)
PT Cancellation Note  Patient Details Name: [name] MRN: [id number] DOB: June 05, 1929   Cancelled Treatment:    Reason Eval/Treat Not Completed: Pain limiting ability to participate - pt reports she is in too much pain, plan for kyphoplasty later today. PT to check back tomorrow.     Marye Round, PT DPT [facility] Secure Chat Preferred  Office [phone]    Truddie Coco 10/04/2022, 1:48 PM

## 2022-10-04 NOTE — Transfer of Care (Addendum)
Immediate Anesthesia Transfer of Care Note  Patient: [name]  Procedure(s) Performed: Procedure canceled.  Patient Location: Radiology  Anesthesia Type:MAC  Level of Consciousness: awake, alert , and oriented  Airway & Oxygen Therapy: Patient Spontanous Breathing and Patient connected to nasal cannula oxygen  Post-op Assessment: Report given to RN and Post -op Vital signs reviewed and stable  Post vital signs: Reviewed and stable  Last Vitals:  Vitals Value Taken Time  BP    Temp    Pulse    Resp    SpO2      Last Pain:  Vitals:   10/04/22 1612  TempSrc:   PainSc: 10-Worst pain ever      Patients Stated Pain Goal: 0 (10/04/22 2956)  Complications: No notable events documented.

## 2022-10-04 NOTE — H&P (Signed)
Chief Complaint: Patient was seen in consultation today for T3 compression fracture  Referring Physician(s):   Supervising Physician: Julieanne Cotton  Patient Status: [facility] - In-pt  History of Present Illness: [name] is a 89 y.o. female who presented to [facility] ED after an unwitnessed fall. Imaging revealed subdural hematoma seen on CT, as well as new T3 and C7 compression fracture. She has undergone prior treatment at T4 (11/10/19) as well as T8 (07/14/20).  She has experienced several falls recently including a hip fracture in March earlier this year for which she required operative management. Case reviewed by Dr. Corliss Skains for possible vertebroplasty/kyphoplasty.    5/23: Pt seen in SS holding.  No changes noted. Remains in hard C-collar  Past Medical History:  Diagnosis Date   CAD (coronary artery disease)    mild nonobstructive disease on cath in 2003   Cancer [facility])    Cardiomyopathy    Probable Takotsubo, severe CP w/ normal cath in 1994. Severe CP in 2003 w/ widespread T wave inversions on ECG. Cath w/ minimal coronary disease but LV-gram showed periapical severe hypokinesis and basilar hyperkinesia (EF 40%). Last echo in 4/09 confirmed full LV functional recovery with EF 60%, no regional wall motion abnormalities, mild to moderate LVH.   Chronic diastolic CHF (congestive heart failure) (HCC) 09/13/2019   CVA (cerebral infarction)    Depression with anxiety 03/29/2011   lost husband 3'09   DVT (deep venous thrombosis) (HCC)    after venous ablation, R leg   Fracture 09/10/2007   L2, status post vertebroplasty of L2 performed by IR   GERD (gastroesophageal reflux disease)    Hemorrhoids    Hiatal hernia 03/29/2011   no nerve problems   Hyperlipidemia    Hyperplastic colon polyp 06/2001   Hypertension    Iron deficiency anemia due to chronic blood loss 12/30/2017   OA (osteoarthritis) of knee 03/29/2011   w/ bilateral knee pain-not a problem now   OSA  (obstructive sleep apnea) 03/29/2011   no cpap used, not a problem now.   Osteoporosis    Otalgia of both ears    Dr. Emeline Darling   Polycythemia    Stroke [facility]) 03/29/2011   CVA x2 -last 10'12-?TIA(visual problems)   Varicose vein     Past Surgical History:  Procedure Laterality Date   APPENDECTOMY     BACK SURGERY     BREAST BIOPSY     BREAST CYST EXCISION     BREAST LUMPECTOMY Left 2013   left stage I left breast cancer   CHOLECYSTECTOMY  04/09/2011   Procedure: LAPAROSCOPIC CHOLECYSTECTOMY WITH INTRAOPERATIVE CHOLANGIOGRAM;  Surgeon: Adolph Pollack, MD;  Location: [facility] ORS;  Service: General;  Laterality: N/A;   CORONARY ATHERECTOMY N/A 02/26/2022   Procedure: CORONARY ATHERECTOMY;  Surgeon: Yvonne Kendall, MD;  Location: [facility] INVASIVE CV LAB;  Service: Cardiovascular;  Laterality: N/A;   CORONARY LITHOTRIPSY N/A 02/26/2022   Procedure: CORONARY LITHOTRIPSY;  Surgeon: Yvonne Kendall, MD;  Location: [facility] INVASIVE CV LAB;  Service: Cardiovascular;  Laterality: N/A;   CORONARY STENT INTERVENTION N/A 02/26/2022   Procedure: CORONARY STENT INTERVENTION;  Surgeon: Yvonne Kendall, MD;  Location: [facility] INVASIVE CV LAB;  Service: Cardiovascular;  Laterality: N/A;   CORONARY ULTRASOUND/IVUS N/A 02/26/2022   Procedure: Intravascular Ultrasound/IVUS;  Surgeon: Yvonne Kendall, MD;  Location: [facility] INVASIVE CV LAB;  Service: Cardiovascular;  Laterality: N/A;   Dental Extraction     L maxillary molar   FEMUR IM NAIL Right 07/25/2022  Procedure: INTRAMEDULLARY (IM) NAIL FEMORAL;  Surgeon: Yolonda Kida, MD;  Location: [facility] OR;  Service: Orthopedics;  Laterality: Right;  hana table,carm.synthesis   INTRAMEDULLARY (IM) NAIL INTERTROCHANTERIC Left 12/25/2019   Procedure: INTRAMEDULLARY (IM) NAIL INTERTROCHANTRIC;  Surgeon: Durene Romans, MD;  Location: [facility] ORS;  Service: Orthopedics;  Laterality: Left;   IR KYPHO THORACIC WITH BONE BIOPSY  09/15/2019   IR KYPHO THORACIC WITH BONE BIOPSY  07/13/2020        IR KYPHO THORACIC WITH BONE BIOPSY  07/13/2020   IR VERTEBROPLASTY CERV/THOR BX INC UNI/BIL INC/INJECT/IMAGING  11/10/2019   KYPHOPLASTY N/A 11/16/2020   Procedure: T11,  L1,KYPHOPLASTY;  Surgeon: Venita Lick, MD;  Location: [facility] OR;  Service: Orthopedics;  Laterality: N/A;   KYPHOSIS SURGERY  08/2007   cement used   LEFT HEART CATH AND CORONARY ANGIOGRAPHY N/A 02/23/2022   Procedure: LEFT HEART CATH AND CORONARY ANGIOGRAPHY;  Surgeon: [place], Peter M, MD;  Location: [facility] INVASIVE CV LAB;  Service: Cardiovascular;  Laterality: N/A;   LEFT HEART CATH AND CORONARY ANGIOGRAPHY N/A 02/26/2022   Procedure: LEFT HEART CATH AND CORONARY ANGIOGRAPHY;  Surgeon: Yvonne Kendall, MD;  [facility] CV LAB;  Service: Cardiovascular;  Laterality: N/A;   OVARIAN CYST SURGERY     left   RADIOLOGY WITH ANESTHESIA N/A 11/10/2019   Procedure: VERTEBROPLASTY;  Surgeon: Julieanne Cotton, MD;  Location: [facility] OR;  Service: Radiology;  Laterality: N/A;   TEMPORARY PACEMAKER N/A 02/26/2022   Procedure: TEMPORARY PACEMAKER;  Surgeon: Yvonne Kendall, MD;  [facility]: [facility] INVAS[facility] CV LAB;  Service: Cardiovascular;  Laterality: N/A;   TONSILLECTOMY     TOTAL KNEE ARTHROPLASTY  06/2010   left   TUBAL LIGATION      Allergies: Penicillins, Valsartan, Atorvastatin, Carvedilol, Ezetimibe, Fluvastatin sodium, Magnesium hydroxide, Meloxicam, Pneumovax [pneumococcal polysaccharide vaccine], Quinapril hcl, Simvastatin, Topamax [topiramate], and Vit d-vit e-safflower oil  Medications:  Current Facility-Administered Medications:    [MAR Hold] acetaminophen (TYLENOL) tablet 650 mg, 650 mg, Oral, Q6H WA, Gonfa, Taye T, MD, 650 mg at 10/04/22 0806   [MAR Hold] amiodarone (PACERONE) tablet 200 mg, 200 mg, Oral, Daily, Hall, Carole N, DO, 200 mg at 10/04/22 0806   [MAR Hold] docusate sodium (COLACE) capsule 100 mg, 100 mg, Oral, BID, Rolly Salter, MD, 100 mg at 10/04/22 0806   [MAR Hold] feeding supplement (ENSURE ENLIVE /  ENSURE PLUS) liquid 237 mL, 237 mL, Oral, BID BM, Gonfa, Taye T, MD   [MAR Hold] ipratropium-albuterol (DUONEB) 0.5-2.5 (3) MG/3ML nebulizer solution 3 mL, 3 mL, Nebulization, Q2H PRN, Hall, Carole N, DO   lactated ringers infusion, , Intravenous, Continuous, Beryle Lathe, MD, Last Rate: 10 mL/hr at 10/04/22 1624, New Bag at 10/04/22 1624   [MAR Hold] LORazepam (ATIVAN) tablet 0.5 mg, 0.5 mg, Oral, BID PRN, Rolly Salter, MD, 0.5 mg at 10/02/22 2025   [MAR Hold] morphine (PF) 2 MG/ML injection 2 mg, 2 mg, Intravenous, Q2H PRN, Rolly Salter, MD, 2 mg at 10/04/22 0605   [facility] Hold] oxyCODONE (Oxy IR/ROXICODONE) immediate release tablet 5 mg, 5 mg, Oral, Q8H PRN, Alanda Slim, Taye T, MD, 5 mg at 10/04/22 1334   [MAR Hold] pantoprazole (PROTONIX) EC tablet 40 mg, 40 mg, Oral, Daily, Hall, Carole N, DO, 40 mg at 10/04/22 0806   [MAR Hold] polyethylene glycol (MIRALAX / GLYCOLAX) packet 17 g, 17 g, Oral, Daily PRN, Hall, Carole N, DO   [MAR Hold] prochlorperazine (COMPAZINE) injection 5 mg, 5 mg, Intravenous, Q6H PRN, Margo Aye,  Oliver Pila, DO, 5 mg at 09/30/22 1800   [MAR Hold] rosuvastatin (CRESTOR) tablet 20 mg, 20 mg, Oral, QHS, Hall, Carole N, DO, 20 mg at 10/03/22 2106   [facility] Hold] sertraline (ZOLOFT) tablet 12.5 mg, 12.5 mg, Oral, QHS, Hall, Carole N, DO, 12.5 mg at 10/03/22 2106    Family History  Problem Relation Age of Onset   Breast cancer Mother    Heart disease Father        MIs   Colon cancer Other        cousin   Breast cancer Sister    Leukemia Brother        GM    Social History   Socioeconomic History   Marital status: Widowed    Spouse name: Not on file   Number of children: 3   Years of education: Not on file   Highest education level: Not on file  Occupational History   Occupation: n/a  Tobacco Use   Smoking status: Some Days    Packs/day: 1.00    Years: 37.00    Additional pack years: 0.00    Total pack years: 37.00    Types: Cigarettes    Start date: 06/26/1951    Smokeless tobacco: Never   Tobacco comments:    quit 25 years ago  Vaping Use   Vaping Use: Never used  Substance and Sexual Activity   Alcohol use: Not Currently    Alcohol/week: 1.0 standard drink of alcohol    Types: 1 Glasses of wine per week   Drug use: No   Sexual activity: Not Currently  Other Topics Concern   Not on file  Social History Narrative   Lost husband 07/29/07-    Lives in [place]   has 3 daughters, lost [place]ona Rieger (03-2017)   Moved to ALF 04-2021   [name] lives in [place], [place] ( [place])     Not driving as of [phone]            Social Determinants of Health   Financial Resource Strain: Low Risk  (08/10/2021)   Overall Financial Resource Strain (CARDIA)    Difficulty of Paying Living Expenses: Not hard at all  Food Insecurity: No Food Insecurity (07/24/2022)   Hunger Vital Sign    Worried About Running Out of Food in the Last Year: Never true    Ran Out of Food in the Last Year: Never true  Transportation Needs: No Transportation Needs (07/24/2022)   PRAPARE - Administrator, Civil Service (Medical): No    Lack of Transportation (Non-Medical): No  Physical Activity: Not on file  Stress: Not on file  Social Connections: Not on file    Code Status: Full Code  Review of Systems: A 12 point ROS discussed and pertinent positives are indicated in the HPI above.  All other systems are negative.  Review of Systems  Constitutional:  Negative for chills and fever.  Respiratory:  Negative for chest tightness and shortness of breath.   Cardiovascular:  Negative for chest pain and leg swelling.  Gastrointestinal:  Negative for abdominal pain, diarrhea, nausea and vomiting.  Musculoskeletal:  Positive for back pain.  Neurological:  Negative for dizziness.  Psychiatric/Behavioral:  Negative for confusion.     Vital Signs: BP (!) 151/79   Pulse 83   Temp 98 F (36.7 C) (Oral)   Resp 18   Ht 5\' 6"  (1.676 m)   Wt 127 lb 13.9 oz (58 kg)   SpO2 97%  BMI 20.64 kg/m   Advance Care Plan: The advanced care plan/surrogate decision maker was discussed at the time of visit and documented in the medical record.  Patient's daughter is preferred surrogate decision maker, patient states daughter is HCPOA    Labs:  CBC: Recent Labs    09/29/22 0219 09/30/22 0445 10/01/22 0248 10/02/22 0725  WBC 14.3* 10.9* 8.8 9.3  HGB 11.9* 12.4 11.9* 12.7  HCT 40.1 41.5 39.8 42.2  PLT 398 390 363 438*     COAGS: Recent Labs    02/23/22 1507 09/28/22 2256  INR 1.1 1.4*  APTT 34  --      BMP: Recent Labs    09/29/22 1102 09/30/22 0445 10/01/22 0248 10/02/22 0725  NA 134* 131* 131* 132*  K 3.7 3.5 3.4* 4.0  CL 96* 94* 94* 95*  CO2 30 28 30 30   GLUCOSE 153* 130* 113* 126*  BUN 10 11 11 9   CALCIUM 8.4* 8.1* 7.8* 8.5*  CREATININE 0.57 0.54 0.54 0.57  GFRNONAA >60 >60 >60 >60     LIVER FUNCTION TESTS: Recent Labs    02/23/22 1507 02/23/22 1741 06/20/22 1154 09/28/22 2256  BILITOT 0.9 0.9 1.4* 1.2  AST 22 20 24 24   ALT 10 8 14 19   ALKPHOS 101 76 49 78  PROT 6.0* 4.4* 7.4 6.6  ALBUMIN 3.3* 2.5* 4.3 3.4*     TUMOR MARKERS: No results for input(s): "AFPTM", "CEA", "CA199", "CHROMGRNA" in the last 8760 hours.  Assessment and Plan:   C7, T3 compression fractures The C7 fracture is not amenable to VP/KP due to increased risk with procedure, however the T3 fracture can be treated with VP/KP Anesthesia has been booked for procedure  Risks and benefits of T3 kyphoplasty were discussed with the patient including, but not limited to education regarding the natural healing process of compression fractures without intervention, bleeding, infection, cement migration which may cause spinal cord damage, paralysis, pulmonary embolism or even death.  This interventional procedure involves the use of X-rays and because of the nature of the planned procedure, it is possible that we will have prolonged use of X-ray  fluoroscopy.  Potential radiation risks to you include (but are not limited to) the following: - A slightly elevated risk for cancer  several years later in life. This risk is typically less than 0.5% percent. This risk is low in comparison to the normal incidence of human cancer, which is 33% for women and 50% for men according to the American Cancer Society. - Radiation induced injury can include skin redness, resembling a rash, tissue breakdown / ulcers and hair loss (which can be temporary or permanent).   The likelihood of either of these occurring depends on the difficulty of the procedure and whether you are sensitive to radiation due to previous procedures, disease, or genetic conditions.   IF your procedure requires a prolonged use of radiation, you will be notified and given written instructions for further action.  It is your responsibility to monitor the irradiated area for the 2 weeks following the procedure and to notify your physician if you are concerned that you have suffered a radiation induced injury.    All of the patient's questions were answered, patient is agreeable to proceed.  Consent signed and in IR suite.   Thank you for this interesting consult.  I greatly enjoyed meeting TIWANDA CLAVERIA and look forward to participating in their care.  A copy of this report was sent to the requesting provider  on this date.  Electronically Signed: Brayton El, PA-C-C 10/04/2022, 4:38 PM   I spent a total of 10 minutes in face to face in clinical consultation, greater than 50% of which was counseling/coordinating care for T3 compression fracture

## 2022-10-04 NOTE — Progress Notes (Signed)
PROGRESS NOTE  [name] NWG:[id number] DOB: Sep 27, 1929   PCP: [facility]  Patient is from: SNF  DOA: 09/28/2022 LOS: 5  Chief complaints Chief Complaint  Patient presents with   Fall     Brief Narrative / Interim history: 89 year old F with PMH of HTN, polycythemia, depression, GERD, CVA, PAF/PE/DVT on Eliquis, multiple prior kyphoplasty presented to hospital with complaints of an unwitnessed fall and admitted from subdural hematoma and, C7 and T3 compression fracture. Found to have a T3 and C7 compression fracture. Neurosurgery recommended conservative management.  IR planning kyphoplasty of T3 compression fracture on 5/23 after Plavix washout.      Subjective: Seen and examined earlier this morning.  No major events overnight of this morning.  She reports left shoulder pain.  Back pain has eased off after pain medication.  She is awake and alert and oriented x 4 today.  Objective: Vitals:   10/03/22 1946 10/04/22 0031 10/04/22 0545 10/04/22 0735  BP: (!) 156/77 134/67 (!) 142/78 126/70  Pulse: 83 77 84 83  Resp: 14 18 18 18   Temp: 98 F (36.7 C) 98.2 F (36.8 C) 98.4 F (36.9 C) 97.7 F (36.5 C)  TempSrc: Oral Oral Oral Oral  SpO2: 100% 93% 95% 91%    Examination:  GENERAL: No apparent distress.  Nontoxic. HEENT: MMM.  Vision and hearing grossly intact.  NECK: Neck collar in place. RESP:  No IWOB.  Fair aeration bilaterally. CVS:  RRR. Heart sounds normal.  ABD/GI/GU: BS+. Abd soft, NTND.  MSK/EXT:   No apparent deformity. Moves extremities. No edema.  SKIN: no apparent skin lesion or wound NEURO: Awake and alert. Oriented x 4.  No apparent focal neurodeficit. PSYCH: Calm. Normal affect.   Procedures:  None  Microbiology summarized: None  Assessment and plan: Principal Problem:   Subdural hematoma (HCC) Active Problems:   Polycythemia   Essential hypertension   Chronic diastolic CHF (congestive heart failure) (HCC)   PE  (pulmonary thromboembolism) (HCC)   GERD (gastroesophageal reflux disease)   Breast cancer (HCC)   Depression with anxiety   Protein-calorie malnutrition, severe   Presence of drug-eluting stent in right coronary artery   Compression fracture of C7 vertebra (HCC)   Compression fracture of T3 vertebra (HCC)  Subdural hematoma, post fall, POA: Initial CT showed 3 mm acute small right parafalcine SDH.  Stable on repeat CT.  Neurosurgery recommended holding Eliquis for 1 week and outpatient follow-up in 2 weeks.  Of note, patient is also on Plavix which could increase her risk of bleeding. -Neurosurgery recommended conservative management.  T3 compression fracture: CT showed acute T3 compression fracture with 30 to 40% height loss and mild fragmentation but no cord compression. -Plan for kyphoplasty on 5/23 after Plavix washout -Pain control  C7 compression fracture: CT showed C7 fracture with about 15% height loss and mild anterior bulge -Continue c-collar until then.  Can only remove it for bathing. -PT/OT recommended SNF.   Acute hypoxic respiratory failure: Seems resolved. -Continue breathing treatments   History of CAD/Takotsubo cardiomyopathy: Patient with multivessel CAD.  She underwent PCI with orbital atherectomy, intracoronary lithotripsy and placement of Synergy DES in 02/2022.  Patient with intermittent chest pain likely musculoskeletal.  TTE reassuring.  -Continue Plavix after 10/07/2022. -Continue statin.  Paroxysmal A-fib on Eliquis: Rate controlled. -Continue amiodarone -Continue holding Eliquis -Optimize electrolytes   Leukocytosis: Resolved.   Hypokalemia: Resolved.  Mild hyponatremia: Stable -Continue monitoring  GERD -Resume home PPI   Hyperlipidemia -Continue  home Crestor.   Chronic anxiety/depression -Continue home meds   Goals of care conversation: Remains full code.  See goals of care discussion by previous attending on 5/21. -Follow palliative care  recs  There is no height or weight on file to calculate BMI.  Nutrition Problem: Inadequate oral intake Etiology: lethargy/confusion, poor appetite Signs/Symptoms: meal completion < 50% Interventions: Ensure Enlive (each supplement provides 350kcal and 20 grams of protein)   DVT prophylaxis:  SCDs Start: 09/29/22 0400  Code Status: Full code Family Communication: None at bedside Level of care: Telemetry Medical Status is: Inpatient Remains inpatient appropriate because: Due to thoracic spine compression fracture   Final disposition: SNF Consultants:  Neurosurgery  35 minutes with more than 50% spent in reviewing records, counseling patient/family and coordinating care.   Sch Meds:  Scheduled Meds:  acetaminophen  650 mg Oral Q6H WA   amiodarone  200 mg Oral Daily   docusate sodium  100 mg Oral BID   feeding supplement  237 mL Oral BID BM   pantoprazole  40 mg Oral Daily   rosuvastatin  20 mg Oral QHS   sertraline  12.5 mg Oral QHS   Continuous Infusions: PRN Meds:.ipratropium-albuterol, LORazepam, morphine injection, oxyCODONE, polyethylene glycol, prochlorperazine  Antimicrobials: Anti-infectives (From admission, onward)    Start     Dose/Rate Route Frequency Ordered Stop   10/04/22 0000  vancomycin (VANCOCIN) IVPB 1000 mg/200 mL premix        1,000 mg 200 mL/hr over 60 Minutes Intravenous To Radiology 10/03/22 1346 10/04/22 0102        I have personally reviewed the following labs and images: CBC: Recent Labs  Lab 09/28/22 2256 09/28/22 2257 09/29/22 0215 09/29/22 0219 09/30/22 0445 10/01/22 0248 10/02/22 0725  WBC 13.6*  --   --  14.3* 10.9* 8.8 9.3  HGB 12.7   < > 13.6 11.9* 12.4 11.9* 12.7  HCT 42.0   < > 40.0 40.1 41.5 39.8 42.2  MCV 82.7  --   --  83.7 82.5 83.4 81.3  PLT 428*  --   --  398 390 363 438*   < > = values in this interval not displayed.   BMP &GFR Recent Labs  Lab 09/29/22 0219 09/29/22 1102 09/30/22 0445 10/01/22 0248  10/02/22 0725  NA 135 134* 131* 131* 132*  K 2.9* 3.7 3.5 3.4* 4.0  CL 96* 96* 94* 94* 95*  CO2 27 30 28 30 30   GLUCOSE 160* 153* 130* 113* 126*  BUN 11 10 11 11 9   CREATININE 0.68 0.57 0.54 0.54 0.57  CALCIUM 8.5* 8.4* 8.1* 7.8* 8.5*  MG 1.9  --  2.1 2.1 2.1   CrCl cannot be calculated (Unknown ideal weight.). Liver & Pancreas: Recent Labs  Lab 09/28/22 2256  AST 24  ALT 19  ALKPHOS 78  BILITOT 1.2  PROT 6.6  ALBUMIN 3.4*   No results for input(s): "LIPASE", "AMYLASE" in the last 168 hours. No results for input(s): "AMMONIA" in the last 168 hours. Diabetic: No results for input(s): "HGBA1C" in the last 72 hours. No results for input(s): "GLUCAP" in the last 168 hours. Cardiac Enzymes: No results for input(s): "CKTOTAL", "CKMB", "CKMBINDEX", "TROPONINI" in the last 168 hours. No results for input(s): "PROBNP" in the last 8760 hours. Coagulation Profile: Recent Labs  Lab 09/28/22 2256  INR 1.4*   Thyroid Function Tests: No results for input(s): "TSH", "T4TOTAL", "FREET4", "T3FREE", "THYROIDAB" in the last 72 hours. Lipid Profile: No results for input(s): "CHOL", "  HDL", "LDLCALC", "TRIG", "CHOLHDL", "LDLDIRECT" in the last 72 hours. Anemia Panel: No results for input(s): "VITAMINB12", "FOLATE", "FERRITIN", "TIBC", "IRON", "RETICCTPCT" in the last 72 hours. Urine analysis:    Component Value Date/Time   COLORURINE STRAW (A) 02/27/2022 1639   APPEARANCEUR CLEAR 02/27/2022 1639   LABSPEC 1.004 (L) 02/27/2022 1639   LABSPEC 1.010 05/27/2013 1433   PHURINE 7.0 02/27/2022 1639   GLUCOSEU NEGATIVE 02/27/2022 1639   GLUCOSEU NEGATIVE 09/02/2017 1347   HGBUR NEGATIVE 02/27/2022 1639   HGBUR negative 10/06/2009 1015   BILIRUBINUR NEGATIVE 02/27/2022 1639   BILIRUBINUR Negative 01/21/2018 1618   KETONESUR NEGATIVE 02/27/2022 1639   PROTEINUR NEGATIVE 02/27/2022 1639   UROBILINOGEN negative (A) 01/21/2018 1618   UROBILINOGEN 0.2 09/02/2017 1347   UROBILINOGEN 0.2  05/27/2013 1433   NITRITE NEGATIVE 02/27/2022 1639   LEUKOCYTESUR NEGATIVE 02/27/2022 1639   Sepsis Labs: Invalid input(s): "PROCALCITONIN", "LACTICIDVEN"  Microbiology: No results found for this or any previous visit (from the past 240 hour(s)).  Radiology Studies: No results found.    Kwanza Cancelliere T. Raquel Racey [facility]  If 7PM-7AM, please contact night-coverage [email or web address] 10/04/2022, 11:52 AM

## 2022-10-05 ENCOUNTER — Encounter (HOSPITAL_COMMUNITY): Payer: Self-pay | Admitting: Interventional Radiology

## 2022-10-05 ENCOUNTER — Inpatient Hospital Stay (HOSPITAL_COMMUNITY): Payer: Medicare Other

## 2022-10-05 DIAGNOSIS — Z515 Encounter for palliative care: Secondary | ICD-10-CM | POA: Diagnosis not present

## 2022-10-05 DIAGNOSIS — S065XAA Traumatic subdural hemorrhage with loss of consciousness status unknown, initial encounter: Secondary | ICD-10-CM | POA: Diagnosis not present

## 2022-10-05 DIAGNOSIS — Z7189 Other specified counseling: Secondary | ICD-10-CM | POA: Diagnosis not present

## 2022-10-05 DIAGNOSIS — S22030D Wedge compression fracture of third thoracic vertebra, subsequent encounter for fracture with routine healing: Secondary | ICD-10-CM | POA: Diagnosis not present

## 2022-10-05 DIAGNOSIS — C50011 Malignant neoplasm of nipple and areola, right female breast: Secondary | ICD-10-CM | POA: Diagnosis not present

## 2022-10-05 DIAGNOSIS — I2699 Other pulmonary embolism without acute cor pulmonale: Secondary | ICD-10-CM | POA: Diagnosis not present

## 2022-10-05 DIAGNOSIS — I10 Essential (primary) hypertension: Secondary | ICD-10-CM | POA: Diagnosis not present

## 2022-10-05 LAB — CBC
HCT: 44.1 % (ref 36.0–46.0)
Hemoglobin: 13.5 g/dL (ref 12.0–15.0)
MCH: 24.5 pg — ABNORMAL LOW (ref 26.0–34.0)
MCHC: 30.6 g/dL (ref 30.0–36.0)
MCV: 80.2 fL (ref 80.0–100.0)
Platelets: 505 10*3/uL — ABNORMAL HIGH (ref 150–400)
RBC: 5.5 MIL/uL — ABNORMAL HIGH (ref 3.87–5.11)
RDW: 17.1 % — ABNORMAL HIGH (ref 11.5–15.5)
WBC: 8.9 10*3/uL (ref 4.0–10.5)
nRBC: 0 % (ref 0.0–0.2)

## 2022-10-05 LAB — RENAL FUNCTION PANEL
Albumin: 2.9 g/dL — ABNORMAL LOW (ref 3.5–5.0)
Anion gap: 8 (ref 5–15)
BUN: 9 mg/dL (ref 8–23)
CO2: 30 mmol/L (ref 22–32)
Calcium: 8.5 mg/dL — ABNORMAL LOW (ref 8.9–10.3)
Chloride: 93 mmol/L — ABNORMAL LOW (ref 98–111)
Creatinine, Ser: 0.55 mg/dL (ref 0.44–1.00)
GFR, Estimated: >60 mL/min (ref >60)
Glucose, Bld: 110 mg/dL — ABNORMAL HIGH (ref 70–99)
Phosphorus: 2.8 mg/dL (ref 2.5–4.6)
Potassium: 3.9 mmol/L (ref 3.5–5.1)
Sodium: 131 mmol/L — ABNORMAL LOW (ref 135–145)

## 2022-10-05 LAB — MAGNESIUM: Magnesium: 2 mg/dL (ref 1.7–2.4)

## 2022-10-05 NOTE — TOC Progression Note (Signed)
Transition of Care [facility]) - Progression Note    Patient Details  Name: [name] MRN: [id number] Date of Birth: 07-Oct-1929  Transition of Care [facility]) CM/SW Contact  Cornel Werber A [place], [place] Phone Number: 10/05/2022, 12:52 PM  Clinical Narrative:     CSW contacted pt's daughter regarding SNF preference.   She stated if pt is able to go to SNF at discharge she has a preference for [facility].   Barriers to discharge, continued medical work up.   TOC will continue to follow.    Expected Discharge Plan: Skilled Nursing Facility Barriers to Discharge: Continued Medical Work up, SNF Pending bed offer  Expected Discharge Plan and Services In-house Referral: Clinical Social Work     Living arrangements for the past 2 months: Assisted Living Facility                                       Social Determinants of Health (SDOH) Interventions SDOH Screenings   Food Insecurity: No Food Insecurity (07/24/2022)  Housing: Low Risk  (07/24/2022)  Transportation Needs: No Transportation Needs (07/24/2022)  Utilities: Not At Risk (07/24/2022)  Depression (PHQ2-9): Low Risk  (12/19/2021)  Financial Resource Strain: Low Risk  (08/10/2021)  Tobacco Use: High Risk (10/05/2022)    Readmission Risk Interventions     No data to display

## 2022-10-05 NOTE — Progress Notes (Signed)
NIR Procedure Request - T3 - Kyphoplasty  89 y.o. female who presented to [facility] ED after an unwitnessed fall. Imaging revealed subdural hematoma seen on CT, as well as new T3 and C7 compression fracture. She has undergone prior treatment at T4 (11/10/19) as well as T8 (07/14/20).  She has experienced several falls recently including a hip fracture in March earlier this year for which she required operative management.  T3 Kyphoplasty attempted on 5.24.24 unsuccessfully due to multiple concerns: Patient unable to tolerate c- collar removal.  Patient unable to lay prone.  Patient unable to maintain adequate saturation.  Target not able to be visualized for procedure.  Procedure aborted.  Confirmed with NIR Attending Dr. Julieanne Cotton procedure  Patient is not a candidate for percutaneous kyphoplasty This was communicated directly to the Team via EPIC Chat.

## 2022-10-05 NOTE — Progress Notes (Signed)
Physical Therapy Treatment Patient Details Name: [name] MRN: [id number] DOB: [birth year] Today's Date: 10/05/2022   History of Present Illness [name] is a 89 y.o. female who presented to [facility] ED from SNF due to an unwitnessed fall. Imaging revealed subdural hematoma seen on CT, new T3 and C7 compression fracture. PMHx:  osteoporosis,T9,10,11, L1 and L2 compression fxs w/ Kyphoplasty, L hip fracture, OA, CVA, CAD, CA, HTN    PT Comments    Pt admitted with above diagnosis. Pt was able to step pivot to chair with mod assist of 2 with pt with fear causing poor postural stability with pt with posterior lean and narrow BOS.  Pt requires Ue support as well for balance. Continues to need therapy < 2hours at d/C.   Pt currently with functional limitations due to balance and endurance deficits. Pt will benefit from acute skilled PT to increase their independence and safety with mobility to allow discharge.    Recommendations for follow up therapy are one component of a multi-disciplinary discharge planning process, led by the attending physician.  Recommendations may be updated based on patient status, additional functional criteria and insurance authorization.  Follow Up Recommendations  Can patient physically be transported by private vehicle: No    Assistance Recommended at Discharge Frequent or constant Supervision/Assistance  Patient can return home with the following Two people to help with walking and/or transfers;Two people to help with bathing/dressing/bathroom   Equipment Recommendations  Other (comment) (defer to next venue)    Recommendations for Other Services       Precautions / Restrictions Precautions Precautions: Fall;Cervical Precaution Booklet Issued: No Required Braces or Orthoses: Cervical Brace Cervical Brace: Hard collar;At all times Restrictions Weight Bearing Restrictions: No     Mobility  Bed Mobility Overal bed mobility: Needs  Assistance Bed Mobility: Rolling, Sit to Sidelying Rolling: Min assist     Sit to supine: Mod assist, +2 for physical assistance   General bed mobility comments: pt needed assist to come to eOB as shewas initiailly hesitant to get up OOB.  cues for log roll.    Transfers Overall transfer level: Needs assistance Equipment used: 2 person hand held assist Transfers: Sit to/from Stand, Bed to chair/wheelchair/BSC Sit to Stand: +2 physical assistance, +2 safety/equipment, Mod assist, From elevated surface   Step pivot transfers: Mod assist, +2 physical assistance, +2 safety/equipment       General transfer comment: mod A+2 to power up and to take pivotal ssteps to recliner with pt with posterior lean.    Ambulation/Gait Ambulation/Gait assistance: Mod assist, +2 safety/equipment Gait Distance (Feet): 3 Feet Assistive device: 2 person hand held assist Gait Pattern/deviations: Step-to pattern, Decreased step length - right, Decreased step length - left, Decreased stride length, Antalgic, Leaning posteriorly, Staggering left, Staggering right, Wide base of support   Gait velocity interpretation: <1.31 ft/sec, indicative of household ambulator   General Gait Details: Pt refused to walk more than a few steps and kept stating,"I am going to fall." Pt also refused to use RW and wanted to hold onto therapist with therapist in front of her. brougght chair to pt. as pt came to a point where she wouldnt walk further   Stairs             Wheelchair Mobility    Modified Rankin (Stroke Patients Only)       Balance Overall balance assessment: Needs assistance Sitting-balance support: Feet supported, No upper extremity supported, Bilateral upper extremity supported Sitting balance-Leahy Scale:  Fair     Standing balance support: Bilateral upper extremity supported, During functional activity Standing balance-Leahy Scale: Poor Standing balance comment: relies on external and UE  support                            Cognition Arousal/Alertness: Awake/alert Behavior During Therapy: WFL for tasks assessed/performed Overall Cognitive Status: Within Functional Limits for tasks assessed                                 General Comments: tangential thought/verbose        Exercises General Exercises - Lower Extremity Long Arc Quad: AROM, Both, 5 reps, Seated    General Comments General comments (skin integrity, edema, etc.): VSS      Pertinent Vitals/Pain Pain Assessment Pain Assessment: Faces Faces Pain Scale: Hurts even more Pain Location: L shoulderblade, ribs Pain Descriptors / Indicators: Discomfort, Grimacing, Guarding Pain Intervention(s): Limited activity within patient's tolerance, Monitored during session, Repositioned    Home Living Family/patient expects to be discharged to:: Skilled nursing facility                        Prior Function            PT Goals (current goals can now be found in the care plan section) Acute Rehab PT Goals Patient Stated Goal: decrease pain Progress towards PT goals: Progressing toward goals    Frequency    Min 3X/week      PT Plan Current plan remains appropriate    Co-evaluation              AM-PAC PT "6 Clicks" Mobility   Outcome Measure  Help needed turning from your back to your side while in a flat bed without using bedrails?: A Lot Help needed moving from lying on your back to sitting on the side of a flat bed without using bedrails?: A Lot Help needed moving to and from a bed to a chair (including a wheelchair)?: Total Help needed standing up from a chair using your arms (e.g., wheelchair or bedside chair)?: Total Help needed to walk in hospital room?: Total Help needed climbing 3-5 steps with a railing? : Total 6 Click Score: 8    End of Session Equipment Utilized During Treatment: Cervical collar Activity Tolerance: Patient limited by  pain Patient left: in chair;with call bell/phone within reach;with chair alarm set Nurse Communication: Mobility status Antony Salmon) PT Visit Diagnosis: Other abnormalities of gait and mobility (R26.89);Pain     Time: 1030-1055 PT Time Calculation (min) (ACUTE ONLY): 25 min  Charges:  $Therapeutic Exercise: 8-22 mins $Therapeutic Activity: 8-22 mins                     [facility] M,PT Acute Rehab Services [phone]    Bevelyn Buckles 10/05/2022, 1:58 PM

## 2022-10-05 NOTE — Progress Notes (Signed)
Occupational Therapy Treatment Patient Details Name: [name] MRN: [id number] DOB: 1929-10-02 Today's Date: 10/05/2022   History of present illness [name] is a 89 y.o. female who presented to [facility] ED from SNF due to an unwitnessed fall. Imaging revealed subdural hematoma seen on CT, new T3 and C7 compression fracture. PMHx:  osteoporosis,T9,10,11, L1 and L2 compression fxs w/ Kyphoplasty, L hip fracture, OA, CVA, CAD, CA, HTN   OT comments  Pt progressing toward established OT goals. Seeing after pt having spent 45 mins up in chair after pt session. Pt requiring min A of 2 to rise and steady as well as take steps with RW toward bed. Performing seated grooming with min-mod A for opening containers and due to decr activity tolerance. Pt with limited tolerance of shoulder mobility and reluctant to lift BUE above ~20-30 degrees. Will continue to follow.    Recommendations for follow up therapy are one component of a multi-disciplinary discharge planning process, led by the attending physician.  Recommendations may be updated based on patient status, additional functional criteria and insurance authorization.    Assistance Recommended at Discharge Frequent or constant Supervision/Assistance  Patient can return home with the following  A lot of help with walking and/or transfers;A lot of help with bathing/dressing/bathroom;Assistance with cooking/housework;Assistance with feeding;Direct supervision/assist for medications management;Direct supervision/assist for financial management;Help with stairs or ramp for entrance;Assist for transportation   Equipment Recommendations  None recommended by OT    Recommendations for Other Services      Precautions / Restrictions Precautions Precautions: Fall;Cervical Precaution Booklet Issued: No Required Braces or Orthoses: Cervical Brace Cervical Brace: Hard collar;At all times Restrictions Weight Bearing Restrictions: No        Mobility Bed Mobility Overal bed mobility: Needs Assistance Bed Mobility: Rolling, Sit to Sidelying Rolling: Min assist       Sit to sidelying: Max assist, +2 for physical assistance General bed mobility comments: reluctant to go down into sidelying due to pain at ribs assist and cues for log roll.    Transfers Overall transfer level: Needs assistance Equipment used: Rolling walker (2 wheels) Transfers: Sit to/from Stand, Bed to chair/wheelchair/BSC Sit to Stand: Min assist, +2 physical assistance, +2 safety/equipment     Step pivot transfers: Min assist, +2 physical assistance, +2 safety/equipment     General transfer comment: min A+2 to power up     Balance Overall balance assessment: Needs assistance Sitting-balance support: Feet supported Sitting balance-Leahy Scale: Fair     Standing balance support: Bilateral upper extremity supported, During functional activity Standing balance-Leahy Scale: Poor                             ADL either performed or assessed with clinical judgement   ADL Overall ADL's : Needs assistance/impaired     Grooming: Minimal assistance;Sitting;Oral care;Wash/dry face Grooming Details (indicate cue type and reason): Assist due to pt fatigue                 Toilet Transfer: Minimal assistance;+2 for physical assistance;+2 for safety/equipment Toilet Transfer Details (indicate cue type and reason): to bed from chair Toileting- Clothing Manipulation and Hygiene: Total assistance       Functional mobility during ADLs: Minimal assistance;+2 for safety/equipment;+2 for physical assistance General ADL Comments: continues to be limited by pain and activity tolerane    Extremity/Trunk Assessment Upper Extremity Assessment Upper Extremity Assessment: Generalized weakness;RUE deficits/detail;LUE deficits/detail RUE Deficits / Details: shoulder  flexion to ~30 degrees; refusing to attempt more; elbow ROM wfl LUE Deficits /  Details: shoulder flexion to ~30 degrees; refusing to attempt more; elbow ROM wfl   Lower Extremity Assessment Lower Extremity Assessment: Defer to PT evaluation        Vision       Perception     Praxis      Cognition Arousal/Alertness: Awake/alert Behavior During Therapy: WFL for tasks assessed/performed Overall Cognitive Status: Within Functional Limits for tasks assessed                                 General Comments: tangential thought/verbose        Exercises      Shoulder Instructions       General Comments VSS    Pertinent Vitals/ Pain       Pain Assessment Pain Assessment: Faces Faces Pain Scale: Hurts even more Pain Location: L shoulderblade, ribs Pain Descriptors / Indicators: Discomfort, Grimacing, Guarding Pain Intervention(s): Limited activity within patient's tolerance, Monitored during session  Home Living Family/patient expects to be discharged to:: Skilled nursing facility                                        Prior Functioning/Environment              Frequency  Min 2X/week        Progress Toward Goals  OT Goals(current goals can now be found in the care plan section)  Progress towards OT goals: Progressing toward goals  Acute Rehab OT Goals Patient Stated Goal: decr pain OT Goal Formulation: With patient Time For Goal Achievement: 10/13/22 Potential to Achieve Goals: Good ADL Goals Pt Will Perform Grooming: with set-up;sitting Pt Will Perform Upper Body Dressing: with min assist;sitting Pt Will Perform Lower Body Dressing: with mod assist;sit to/from stand Pt Will Transfer to Toilet: with mod assist;stand pivot transfer;bedside commode Additional ADL Goal #1: Pt will complete bed mobility with min A as a precursor to ADLs  Plan Discharge plan remains appropriate    Co-evaluation                 AM-PAC OT "6 Clicks" Daily Activity     Outcome Measure   Help from another  person eating meals?: A Little Help from another person taking care of personal grooming?: A Lot Help from another person toileting, which includes using toliet, bedpan, or urinal?: A Lot Help from another person bathing (including washing, rinsing, drying)?: A Lot Help from another person to put on and taking off regular upper body clothing?: A Lot Help from another person to put on and taking off regular lower body clothing?: Total 6 Click Score: 12    End of Session Equipment Utilized During Treatment: Cervical collar;Gait belt;Rolling walker (2 wheels)  OT Visit Diagnosis: Unsteadiness on feet (R26.81);Other abnormalities of gait and mobility (R26.89);Muscle weakness (generalized) (M62.81);History of falling (Z91.81);Repeated falls (R29.6);Pain   Activity Tolerance Patient limited by pain   Patient Left in bed;with call bell/phone within reach   Nurse Communication Mobility status        Time: 1128-1150 OT Time Calculation (min): 22 min  Charges: OT General Charges $OT Visit: 1 Visit OT Treatments $Self Care/Home Management : 8-22 mins  Myrla Halsted, OTD, OTR/L [facility] Acute Rehabilitation Office: [phone]   Lorain Childes  Gordy Levan 10/05/2022, 12:52 PM

## 2022-10-05 NOTE — Progress Notes (Signed)
PROGRESS NOTE  [name] ZOX:[id number] DOB: Apr 10, 1930   PCP: [facility]  Patient is from: SNF  DOA: 09/28/2022 LOS: 6  Chief complaints Chief Complaint  Patient presents with   Fall     Brief Narrative / Interim history: 89 year old F with PMH of HTN, polycythemia, depression, GERD, CVA, PAF/PE/DVT on Eliquis, multiple prior kyphoplasty presented to hospital with complaints of an unwitnessed fall and admitted from subdural hematoma and, C7 and T3 compression fracture. Found to have a T3 and C7 compression fracture. Neurosurgery recommended conservative management.  IR consulted for T3 kyphoplasty that was attempted and aborted on 5/23 as patient was not able to tolerate c-collar removal, lay prone, maintain adequate saturation in addition to difficulty visualizing target for the procedure.  Per NIR, patient is not a candidate for percutaneous kyphoplasty.  Palliative medicine following.    Subjective: Seen and examined earlier this morning.  No major events overnight of this morning.  Complains of pain in left shoulder.  Back pain fairly controlled.  She denies chest pain or trouble breathing.  Objective: Vitals:   10/04/22 1934 10/05/22 0456 10/05/22 0742 10/05/22 1200  BP: (!) 142/85 (!) 159/98 (!) 147/77 137/64  Pulse: 91 83 78 79  Resp: 16 17 18 18   Temp: 97.9 F (36.6 C) 97.6 F (36.4 C) (!) 97.5 F (36.4 C) 98.1 F (36.7 C)  TempSrc: Oral Oral Oral   SpO2: 98% 100% 98% 98%  Weight:      Height:        Examination:  GENERAL: No apparent distress.  Nontoxic. HEENT: MMM.  Vision and hearing grossly intact.  NECK: Neck collar in place. RESP:  No IWOB.  Fair aeration bilaterally. CVS:  RRR. Heart sounds normal.  ABD/GI/GU: BS+. Abd soft, NTND.  MSK/EXT:   No apparent deformity. Moves extremities. No edema.  SKIN: no apparent skin lesion or wound NEURO: Awake and alert. Oriented fairly.  No apparent focal neurodeficit. PSYCH: Calm.  Normal affect.   Procedures:  5/23-T3 kyphoplasty aborted.  Microbiology summarized: None  Assessment and plan: Principal Problem:   Subdural hematoma (HCC) Active Problems:   Polycythemia   Essential hypertension   Chronic diastolic CHF (congestive heart failure) (HCC)   PE (pulmonary thromboembolism) (HCC)   GERD (gastroesophageal reflux disease)   Breast cancer (HCC)   Depression with anxiety   Protein-calorie malnutrition, severe   Presence of drug-eluting stent in right coronary artery   Compression fracture of C7 vertebra (HCC)   Compression fracture of T3 vertebra (HCC)  Subdural hematoma, post fall, POA: Initial CT showed 3 mm acute small right parafalcine SDH.  Stable on repeat CT.  Neurosurgery recommended holding Eliquis for 1 week and outpatient follow-up in 2 weeks.  Of note, patient is also on Plavix which could increase her risk of bleeding. -Neurosurgery recommended conservative management.  T3 compression fracture: CT showed acute T3 compression fracture with 30 to 40% height loss and mild fragmentation but no cord compression. -T3 kyphoplasty that was attempted and aborted on 5/23 as patient was not able to tolerate c-collar removal, lay prone, maintain adequate saturation in addition to difficulty visualizing target for the procedure.  Per NIR, patient is not a candidate for percutaneous kyphoplasty.   -Palliative medicine following. -Pain control  C7 compression fracture: CT showed C7 fracture with about 15% height loss and mild anterior bulge -Continue C-collar until then.  Can only remove it for bathing. -PT/OT recommended SNF.   Acute hypoxic respiratory failure: No  respiratory distress but remains on oxygen.  CXR with increased densities at both lung bases concerning for bilateral pleural effusion with basilar atelectasis. -Wean oxygen as able. -Continue breathing treatments -Encourage incentive spirometry   History of CAD/Takotsubo cardiomyopathy:  Patient with multivessel CAD.  She underwent PCI with orbital atherectomy, intracoronary lithotripsy and placement of Synergy DES in 02/2022.  Patient with intermittent chest pain likely musculoskeletal.  TTE reassuring.  -Continue Plavix after 10/07/2022. -Continue statin.  Paroxysmal A-fib on Eliquis: Rate controlled. -Continue amiodarone -Continue holding Eliquis. -Optimize electrolytes   Leukocytosis: Resolved.   Hypokalemia: Resolved.  Mild hyponatremia: Stable -Continue monitoring  GERD -Resume home PPI   Hyperlipidemia -Continue home Crestor.   Chronic anxiety/depression -Continue home meds   Goals of care conversation: Remains full code.  See goals of care discussion by previous attending on 5/21. -Follow palliative care recs  Body mass index is 20.64 kg/m. Nutrition Problem: Inadequate oral intake Etiology: lethargy/confusion, poor appetite Signs/Symptoms: meal completion < 50% Interventions: Ensure Enlive (each supplement provides 350kcal and 20 grams of protein)   DVT prophylaxis:  SCDs Start: 09/29/22 0400  Code Status: Full code Family Communication: None at bedside Level of care: Telemetry Medical Status is: Inpatient Remains inpatient appropriate because: Due to thoracic spine compression fracture   Final disposition: SNF Consultants:  Neurosurgery Palliative medicine  35 minutes with more than 50% spent in reviewing records, counseling patient/family and coordinating care.   Sch Meds:  Scheduled Meds:  acetaminophen  650 mg Oral Q6H WA   amiodarone  200 mg Oral Daily   docusate sodium  100 mg Oral BID   feeding supplement  237 mL Oral BID BM   pantoprazole  40 mg Oral Daily   rosuvastatin  20 mg Oral QHS   sertraline  12.5 mg Oral QHS   Continuous Infusions: PRN Meds:.ipratropium-albuterol, LORazepam, morphine injection, oxyCODONE, polyethylene glycol, prochlorperazine  Antimicrobials: Anti-infectives (From admission, onward)     Start     Dose/Rate Route Frequency Ordered Stop   10/04/22 0000  vancomycin (VANCOCIN) IVPB 1000 mg/200 mL premix        1,000 mg 200 mL/hr over 60 Minutes Intravenous To Radiology 10/03/22 1346 10/04/22 0102        I have personally reviewed the following labs and images: CBC: Recent Labs  Lab 09/29/22 0219 09/30/22 0445 10/01/22 0248 10/02/22 0725 10/05/22 0906  WBC 14.3* 10.9* 8.8 9.3 8.9  HGB 11.9* 12.4 11.9* 12.7 13.5  HCT 40.1 41.5 39.8 42.2 44.1  MCV 83.7 82.5 83.4 81.3 80.2  PLT 398 390 363 438* 505*   BMP &GFR Recent Labs  Lab 09/29/22 0219 09/29/22 1102 09/30/22 0445 10/01/22 0248 10/02/22 0725 10/05/22 0906  NA 135 134* 131* 131* 132* 131*  K 2.9* 3.7 3.5 3.4* 4.0 3.9  CL 96* 96* 94* 94* 95* 93*  CO2 27 30 28 30 30 30   GLUCOSE 160* 153* 130* 113* 126* 110*  BUN 11 10 11 11 9 9   CREATININE 0.68 0.57 0.54 0.54 0.57 0.55  CALCIUM 8.5* 8.4* 8.1* 7.8* 8.5* 8.5*  MG 1.9  --  2.1 2.1 2.1 2.0  PHOS  --   --   --   --   --  2.8   Estimated Creatinine Clearance: 41.9 mL/min (by C-G formula based on SCr of 0.55 mg/dL). Liver & Pancreas: Recent Labs  Lab 09/28/22 2256 10/05/22 0906  AST 24  --   ALT 19  --   ALKPHOS 78  --  BILITOT 1.2  --   PROT 6.6  --   ALBUMIN 3.4* 2.9*   No results for input(s): "LIPASE", "AMYLASE" in the last 168 hours. No results for input(s): "AMMONIA" in the last 168 hours. Diabetic: No results for input(s): "HGBA1C" in the last 72 hours. No results for input(s): "GLUCAP" in the last 168 hours. Cardiac Enzymes: No results for input(s): "CKTOTAL", "CKMB", "CKMBINDEX", "TROPONINI" in the last 168 hours. No results for input(s): "PROBNP" in the last 8760 hours. Coagulation Profile: Recent Labs  Lab 09/28/22 2256  INR 1.4*   Thyroid Function Tests: No results for input(s): "TSH", "T4TOTAL", "FREET4", "T3FREE", "THYROIDAB" in the last 72 hours. Lipid Profile: No results for input(s): "CHOL", "HDL", "LDLCALC", "TRIG",  "CHOLHDL", "LDLDIRECT" in the last 72 hours. Anemia Panel: No results for input(s): "VITAMINB12", "FOLATE", "FERRITIN", "TIBC", "IRON", "RETICCTPCT" in the last 72 hours. Urine analysis:    Component Value Date/Time   COLORURINE STRAW (A) 02/27/2022 1639   APPEARANCEUR CLEAR 02/27/2022 1639   LABSPEC 1.004 (L) 02/27/2022 1639   LABSPEC 1.010 05/27/2013 1433   PHURINE 7.0 02/27/2022 1639   GLUCOSEU NEGATIVE 02/27/2022 1639   GLUCOSEU NEGATIVE 09/02/2017 1347   HGBUR NEGATIVE 02/27/2022 1639   HGBUR negative 10/06/2009 1015   BILIRUBINUR NEGATIVE 02/27/2022 1639   BILIRUBINUR Negative 01/21/2018 1618   KETONESUR NEGATIVE 02/27/2022 1639   PROTEINUR NEGATIVE 02/27/2022 1639   UROBILINOGEN negative (A) 01/21/2018 1618   UROBILINOGEN 0.2 09/02/2017 1347   UROBILINOGEN 0.2 05/27/2013 1433   NITRITE NEGATIVE 02/27/2022 1639   LEUKOCYTESUR NEGATIVE 02/27/2022 1639   Sepsis Labs: Invalid input(s): "PROCALCITONIN", "LACTICIDVEN"  Microbiology: Recent Results (from the past 240 hour(s))  Surgical pcr screen     Status: None   Collection Time: 10/04/22  7:29 AM   Specimen: Nasal Mucosa; Nasal Swab  Result Value Ref Range Status   MRSA, PCR NEGATIVE NEGATIVE Final   Staphylococcus aureus NEGATIVE NEGATIVE Final    Comment: (NOTE) The Xpert SA Assay (FDA approved for NASAL specimens in patients 75 years of age and older), is one component of a comprehensive surveillance program. It is not intended to diagnose infection nor to guide or monitor treatment. Performed at [facility], [address] [place], [place] [address]     Radiology Studies: DG Chest Port 1 View  Result Date: 10/05/2022 CLINICAL DATA:  Hypoxemia. EXAM: PORTABLE CHEST 1 VIEW COMPARISON:  09/28/2022 FINDINGS: Increased densities at both lung bases concerning for bilateral pleural effusions and basilar consolidation/airspace disease. Heart size is grossly stable. Again noted is bone cement involving multiple  thoracic vertebral bodies. Prominent interstitial lung markings are similar to the previous examination but difficult to exclude mild edema. Old fracture of the proximal right humerus. IMPRESSION: 1. Increased densities at both lung bases. Findings are concerning for bilateral pleural effusions with basilar atelectasis/consolidation. 2. Prominent interstitial lung markings are similar to the previous examination and cannot exclude mild edema. Electronically Signed   By: Richarda Overlie M.D.   On: 10/05/2022 14:27      [name] [facility]  If 7PM-7AM, please contact night-coverage [email or web address] 10/05/2022, 3:22 PM

## 2022-10-05 NOTE — Progress Notes (Signed)
Palliative:  HPI: 89 y.o. female  with past medical history of CHF, s/p [employer] PPM/ICD, CAD, IDDM, CKD stage 3b, prostatomegaly admitted on 10/11/2021 with weakness x 3 days after fall followed with pain, difficulty to ambulate, decrease intake and urine output. Noted to have hypotension, leukocytosis related to discitis aspiration showing +Klebsiella along with UTI. Hospitalization complicated by cardiogenic shock EF <20%, grade 2 diastolic dysfunction, moderate pulmonary hypertension, renal failure, bladder outlet obstruction, paroxysmal atrial fibrillation. Ongoing significant fluid overload and poor response to diuresis. Trial large dose of Lasix while on dobutamine.   I met again today with [name] along with his daughter and wife at bedside. [name] continues to speak to his poor prognosis and is able to tell his family that he is very much at peace. He reviews that his vital signs have been good but he understands that his heart is "give out" - he says that his heart has served him well for many good years. He is a very spiritual man and reports that he had wonderful visits with chaplain Ellen. He is at peace and is prepared for transition to comfort care when his other daughter returns from [place]. Family at bedside report that they believed she would be back Thurs/Fri but she will not return until Monday July 3rd. [name] was disheartened that she does not return until Monday but they all continue to report their goal to continue to maintain until she returns and then we will transition to full comfort care and allow natural and peaceful death.   I spoke with them again about my recommendation for deactivation of AICD and they all agree that this would be in his best interest at this time. I explained the process and will place order for [employer] to come to deactivate.   All questions/concerns addressed. Emotional support provided.   Exam: Alert, oriented. Frail.  Lying in bed. No distress. Generalized weakness and fatigue. HR 90-100s. Breathing regular, unlabored at rest. Abd soft. BLE edema.   Plan: - DNR - Deactivate AICD - Awaiting daughter's return from vacation to say goodbye and then will transition to full comfort care (she should be back Monday)  40 min  Dawn Bergdoll, NP Palliative Medicine Team Pager [phone] (Please see [email or web address] for schedule) Team Phone [phone]    Greater than 50%  of this time was spent counseling and coordinating care related to the above assessment and plan   

## 2022-10-06 DIAGNOSIS — C50011 Malignant neoplasm of nipple and areola, right female breast: Secondary | ICD-10-CM | POA: Diagnosis not present

## 2022-10-06 DIAGNOSIS — S065XAA Traumatic subdural hemorrhage with loss of consciousness status unknown, initial encounter: Secondary | ICD-10-CM | POA: Diagnosis not present

## 2022-10-06 DIAGNOSIS — I2699 Other pulmonary embolism without acute cor pulmonale: Secondary | ICD-10-CM | POA: Diagnosis not present

## 2022-10-06 DIAGNOSIS — I10 Essential (primary) hypertension: Secondary | ICD-10-CM | POA: Diagnosis not present

## 2022-10-06 LAB — GLUCOSE, CAPILLARY: Glucose-Capillary: 117 mg/dL — ABNORMAL HIGH (ref 70–99)

## 2022-10-06 MED ORDER — METOPROLOL TARTRATE 50 MG PO TABS
50.0000 mg | ORAL_TABLET | Freq: Two times a day (BID) | ORAL | Status: DC
  Administered 2022-10-06 – 2022-10-09 (×7): 50 mg via ORAL
  Filled 2022-10-06 (×7): qty 1

## 2022-10-06 MED ORDER — MORPHINE SULFATE (PF) 2 MG/ML IV SOLN
2.0000 mg | INTRAVENOUS | Status: DC | PRN
  Administered 2022-10-06 – 2022-10-07 (×3): 2 mg via INTRAVENOUS
  Filled 2022-10-06 (×3): qty 1

## 2022-10-06 MED ORDER — CLONAZEPAM 0.5 MG PO TABS
0.5000 mg | ORAL_TABLET | Freq: Two times a day (BID) | ORAL | Status: DC
  Administered 2022-10-06 – 2022-10-07 (×3): 0.5 mg via ORAL
  Filled 2022-10-06 (×3): qty 1

## 2022-10-06 NOTE — Progress Notes (Signed)
PROGRESS NOTE  [name]:[id number] DOB: Dec 14, 1929   PCP: [facility]  Patient is from: SNF  DOA: 09/28/2022 LOS: 7  Chief complaints Chief Complaint  Patient presents with   Fall     Brief Narrative / Interim history: 89 year old F with PMH of HTN, polycythemia, depression, GERD, CVA, PAF/PE/DVT on Eliquis, multiple prior kyphoplasty presented to hospital with complaints of an unwitnessed fall and admitted from subdural hematoma and, C7 and T3 compression fracture. Found to have a T3 and C7 compression fracture. Neurosurgery recommended conservative management.  IR consulted for T3 kyphoplasty that was attempted and aborted on 5/23 as patient was not able to tolerate c-collar removal, lay prone, maintain adequate saturation in addition to difficulty visualizing target for the procedure.  Per NIR, patient is not a candidate for percutaneous kyphoplasty.  Palliative medicine following.    Subjective: Seen and examined earlier this morning.  No major events overnight of this morning.  Complaining of neck pain from neck collar.  Also ordered for some pain on the right shin where she had a skin bruise/laceration.  Also feels anxious.  Objective: Vitals:   10/05/22 2003 10/06/22 0023 10/06/22 0422 10/06/22 1035  BP: (!) 163/72 (!) 159/73 (!) 180/84 137/68  Pulse: 83 81 84 81  Resp: 18 18 18 16   Temp: 97.9 F (36.6 C) 98.3 F (36.8 C) 97.7 F (36.5 C) 97.7 F (36.5 C)  TempSrc: Oral Oral Oral Oral  SpO2: 95% 96% 98% 94%  Weight:      Height:        Examination:  GENERAL: No apparent distress.  Nontoxic. HEENT: MMM.  Vision and hearing grossly intact.  NECK: Neck collar in place. RESP:  No IWOB.  Fair aeration bilaterally. CVS:  RRR. Heart sounds normal.  ABD/GI/GU: BS+. Abd soft, NTND.  MSK/EXT:   No apparent deformity. Moves extremities. No edema.  SKIN: Dressing over RLE DCI. NEURO: Awake and alert. Oriented fairly.  No apparent focal  neurodeficit. PSYCH: Appears anxious.  Procedures:  5/23-T3 kyphoplasty aborted.  Microbiology summarized: None  Assessment and plan: Principal Problem:   Subdural hematoma (HCC) Active Problems:   Polycythemia   Essential hypertension   Chronic diastolic CHF (congestive heart failure) (HCC)   PE (pulmonary thromboembolism) (HCC)   GERD (gastroesophageal reflux disease)   Breast cancer (HCC)   Depression with anxiety   Protein-calorie malnutrition, severe   Presence of drug-eluting stent in right coronary artery   Compression fracture of C7 vertebra (HCC)   Compression fracture of T3 vertebra (HCC)  Subdural hematoma, post fall, POA: Initial CT showed 3 mm acute small right parafalcine SDH.  Stable on repeat CT.  Neurosurgery recommended holding Eliquis for 1 week and outpatient follow-up in 2 weeks.  Of note, patient is also on Plavix which could increase her risk of bleeding. -NS rec on 5/18:  -No acute neurosurgical intervention indicated at this time -Continue hard collar, may remove for bathing  -Restart Eliquis and Plavix in 1 week  -Please call with any concerns or questions  T3 compression fracture: CT showed acute T3 compression fracture with 30 to 40% height loss and mild fragmentation but no cord compression. -T3 kyphoplasty that was attempted and aborted on 5/23 as patient was not able to tolerate c-collar removal, lay prone, maintain adequate saturation in addition to difficulty visualizing target for the procedure.  Per NIR, patient is not a candidate for percutaneous kyphoplasty.   -Palliative medicine following. -Pain control  C7 compression  fracture: CT showed C7 fracture with about 15% height loss and mild anterior bulge -Continue C-collar until then.  Can only remove it for bathing. -PT/OT recommended SNF.   Acute hypoxic respiratory failure: No respiratory distress but remains on oxygen.  CXR with increased densities at both lung bases concerning for  bilateral pleural effusion with basilar atelectasis.  Symptoms are resolved. -Continue breathing treatments -Encourage incentive spirometry   History of CAD/Takotsubo cardiomyopathy: Patient with multivessel CAD.  She underwent PCI with orbital atherectomy, intracoronary lithotripsy and placement of Synergy DES in 02/2022.  Patient with intermittent chest pain likely musculoskeletal.  TTE reassuring.  -Continue Plavix after 10/07/2022. -Continue statin.  Paroxysmal A-fib on Eliquis: Rate controlled. -Continue amiodarone -Continue holding Eliquis.  Will resume on 5/26 -Optimize electrolytes   Leukocytosis: Resolved.   Hypokalemia: Resolved.  Mild hyponatremia: Stable -Continue monitoring  GERD -Resume home PPI   Hyperlipidemia -Continue home Crestor.   Chronic anxiety/depression: Feels and looks anxious -Change Ativan to Klonopin   Goals of care conversation: Remains full code.  -Palliative medicine following  Inadequate oral intake Body mass index is 20.64 kg/m. Nutrition Problem: Inadequate oral intake Etiology: lethargy/confusion, poor appetite Signs/Symptoms: meal completion < 50% Interventions: Ensure Enlive (each supplement provides 350kcal and 20 grams of protein)   DVT prophylaxis:  SCDs Start: 09/29/22 0400  Code Status: Full code Family Communication: None at bedside Level of care: Telemetry Medical Status is: Inpatient Remains inpatient appropriate because: Due to acute on chronic back pain, thoracic spine compression fracture   Final disposition: SNF Consultants:  Neurosurgery Palliative medicine IR  35 minutes with more than 50% spent in reviewing records, counseling patient/family and coordinating care.   Sch Meds:  Scheduled Meds:  acetaminophen  650 mg Oral Q6H WA   amiodarone  200 mg Oral Daily   clonazePAM  0.5 mg Oral BID   docusate sodium  100 mg Oral BID   feeding supplement  237 mL Oral BID BM   metoprolol tartrate  50 mg Oral BID    pantoprazole  40 mg Oral Daily   rosuvastatin  20 mg Oral QHS   sertraline  12.5 mg Oral QHS   Continuous Infusions: PRN Meds:.ipratropium-albuterol, morphine injection, oxyCODONE, polyethylene glycol, prochlorperazine  Antimicrobials: Anti-infectives (From admission, onward)    Start     Dose/Rate Route Frequency Ordered Stop   10/04/22 0000  vancomycin (VANCOCIN) IVPB 1000 mg/200 mL premix        1,000 mg 200 mL/hr over 60 Minutes Intravenous To Radiology 10/03/22 1346 10/04/22 0102        I have personally reviewed the following labs and images: CBC: Recent Labs  Lab 09/30/22 0445 10/01/22 0248 10/02/22 0725 10/05/22 0906  WBC 10.9* 8.8 9.3 8.9  HGB 12.4 11.9* 12.7 13.5  HCT 41.5 39.8 42.2 44.1  MCV 82.5 83.4 81.3 80.2  PLT 390 363 438* 505*   BMP &GFR Recent Labs  Lab 09/30/22 0445 10/01/22 0248 10/02/22 0725 10/05/22 0906  NA 131* 131* 132* 131*  K 3.5 3.4* 4.0 3.9  CL 94* 94* 95* 93*  CO2 28 30 30 30   GLUCOSE 130* 113* 126* 110*  BUN 11 11 9 9   CREATININE 0.54 0.54 0.57 0.55  CALCIUM 8.1* 7.8* 8.5* 8.5*  MG 2.1 2.1 2.1 2.0  PHOS  --   --   --  2.8   Estimated Creatinine Clearance: 41.9 mL/min (by C-G formula based on SCr of 0.55 mg/dL). Liver & Pancreas: Recent Labs  Lab 10/05/22  6578  ALBUMIN 2.9*   No results for input(s): "LIPASE", "AMYLASE" in the last 168 hours. No results for input(s): "AMMONIA" in the last 168 hours. Diabetic: No results for input(s): "HGBA1C" in the last 72 hours. No results for input(s): "GLUCAP" in the last 168 hours. Cardiac Enzymes: No results for input(s): "CKTOTAL", "CKMB", "CKMBINDEX", "TROPONINI" in the last 168 hours. No results for input(s): "PROBNP" in the last 8760 hours. Coagulation Profile: No results for input(s): "INR", "PROTIME" in the last 168 hours.  Thyroid Function Tests: No results for input(s): "TSH", "T4TOTAL", "FREET4", "T3FREE", "THYROIDAB" in the last 72 hours. Lipid Profile: No  results for input(s): "CHOL", "HDL", "LDLCALC", "TRIG", "CHOLHDL", "LDLDIRECT" in the last 72 hours. Anemia Panel: No results for input(s): "VITAMINB12", "FOLATE", "FERRITIN", "TIBC", "IRON", "RETICCTPCT" in the last 72 hours. Urine analysis:    Component Value Date/Time   COLORURINE STRAW (A) 02/27/2022 1639   APPEARANCEUR CLEAR 02/27/2022 1639   LABSPEC 1.004 (L) 02/27/2022 1639   LABSPEC 1.010 05/27/2013 1433   PHURINE 7.0 02/27/2022 1639   GLUCOSEU NEGATIVE 02/27/2022 1639   GLUCOSEU NEGATIVE 09/02/2017 1347   HGBUR NEGATIVE 02/27/2022 1639   HGBUR negative 10/06/2009 1015   BILIRUBINUR NEGATIVE 02/27/2022 1639   BILIRUBINUR Negative 01/21/2018 1618   KETONESUR NEGATIVE 02/27/2022 1639   PROTEINUR NEGATIVE 02/27/2022 1639   UROBILINOGEN negative (A) 01/21/2018 1618   UROBILINOGEN 0.2 09/02/2017 1347   UROBILINOGEN 0.2 05/27/2013 1433   NITRITE NEGATIVE 02/27/2022 1639   LEUKOCYTESUR NEGATIVE 02/27/2022 1639   Sepsis Labs: Invalid input(s): "PROCALCITONIN", "LACTICIDVEN"  Microbiology: Recent Results (from the past 240 hour(s))  Surgical pcr screen     Status: None   Collection Time: 10/04/22  7:29 AM   Specimen: Nasal Mucosa; Nasal Swab  Result Value Ref Range Status   MRSA, PCR NEGATIVE NEGATIVE Final   Staphylococcus aureus NEGATIVE NEGATIVE Final    Comment: (NOTE) The Xpert SA Assay (FDA approved for NASAL specimens in patients 33 years of age and older), is one component of a comprehensive surveillance program. It is not intended to diagnose infection nor to guide or monitor treatment. Performed at [facility], [address] [place], [place] [address]     Radiology Studies: No results found.    Shaleigh Laubscher T. Ernie Kasler [facility]  If 7PM-7AM, please contact night-coverage [email or web address] 10/06/2022, 12:12 PM

## 2022-10-06 NOTE — Plan of Care (Signed)

## 2022-10-07 DIAGNOSIS — I2699 Other pulmonary embolism without acute cor pulmonale: Secondary | ICD-10-CM | POA: Diagnosis not present

## 2022-10-07 DIAGNOSIS — I10 Essential (primary) hypertension: Secondary | ICD-10-CM | POA: Diagnosis not present

## 2022-10-07 DIAGNOSIS — C50011 Malignant neoplasm of nipple and areola, right female breast: Secondary | ICD-10-CM | POA: Diagnosis not present

## 2022-10-07 DIAGNOSIS — S065XAA Traumatic subdural hemorrhage with loss of consciousness status unknown, initial encounter: Secondary | ICD-10-CM | POA: Diagnosis not present

## 2022-10-07 LAB — RENAL FUNCTION PANEL
Albumin: 2.8 g/dL — ABNORMAL LOW (ref 3.5–5.0)
Anion gap: 9 (ref 5–15)
BUN: 8 mg/dL (ref 8–23)
CO2: 26 mmol/L (ref 22–32)
Calcium: 8.4 mg/dL — ABNORMAL LOW (ref 8.9–10.3)
Chloride: 95 mmol/L — ABNORMAL LOW (ref 98–111)
Creatinine, Ser: 0.65 mg/dL (ref 0.44–1.00)
GFR, Estimated: >60 mL/min (ref >60)
Glucose, Bld: 89 mg/dL (ref 70–99)
Phosphorus: 3.2 mg/dL (ref 2.5–4.6)
Potassium: 4.1 mmol/L (ref 3.5–5.1)
Sodium: 130 mmol/L — ABNORMAL LOW (ref 135–145)

## 2022-10-07 LAB — MAGNESIUM: Magnesium: 2 mg/dL (ref 1.7–2.4)

## 2022-10-07 LAB — CBC
HCT: 45.4 % (ref 36.0–46.0)
Hemoglobin: 13.9 g/dL (ref 12.0–15.0)
MCH: 24.6 pg — ABNORMAL LOW (ref 26.0–34.0)
MCHC: 30.6 g/dL (ref 30.0–36.0)
MCV: 80.2 fL (ref 80.0–100.0)
Platelets: 484 10*3/uL — ABNORMAL HIGH (ref 150–400)
RBC: 5.66 MIL/uL — ABNORMAL HIGH (ref 3.87–5.11)
RDW: 17.3 % — ABNORMAL HIGH (ref 11.5–15.5)
WBC: 7.3 10*3/uL (ref 4.0–10.5)
nRBC: 0 % (ref 0.0–0.2)

## 2022-10-07 MED ORDER — OXYCODONE HCL 5 MG PO TABS
5.0000 mg | ORAL_TABLET | Freq: Four times a day (QID) | ORAL | Status: DC | PRN
  Administered 2022-10-07 – 2022-10-09 (×6): 5 mg via ORAL
  Filled 2022-10-07 (×5): qty 1

## 2022-10-07 MED ORDER — CLONAZEPAM 0.5 MG PO TABS
0.5000 mg | ORAL_TABLET | Freq: Two times a day (BID) | ORAL | Status: DC | PRN
  Administered 2022-10-07: 0.5 mg via ORAL
  Filled 2022-10-07: qty 1

## 2022-10-07 MED ORDER — APIXABAN 2.5 MG PO TABS
2.5000 mg | ORAL_TABLET | Freq: Two times a day (BID) | ORAL | Status: DC
  Administered 2022-10-07 – 2022-10-09 (×5): 2.5 mg via ORAL
  Filled 2022-10-07 (×5): qty 1

## 2022-10-07 MED ORDER — LIDOCAINE 5 % EX PTCH
1.0000 | MEDICATED_PATCH | CUTANEOUS | Status: DC
  Administered 2022-10-07 – 2022-10-09 (×3): 1 via TRANSDERMAL
  Filled 2022-10-07 (×3): qty 1

## 2022-10-07 NOTE — Progress Notes (Signed)
PROGRESS NOTE  [name] ZOX:[id number] DOB: 19-Mar-1930   PCP: [facility]  Patient is from: SNF  DOA: 09/28/2022 LOS: 8  Chief complaints Chief Complaint  Patient presents with   Fall     Brief Narrative / Interim history: 89 year old F with PMH of HTN, polycythemia, depression, GERD, CVA, PAF/PE/DVT on Eliquis, multiple prior kyphoplasty presented to hospital with complaints of an unwitnessed fall and admitted from subdural hematoma and, C7 and T3 compression fracture. Found to have a T3 and C7 compression fracture. Neurosurgery recommended conservative management.  IR consulted for T3 kyphoplasty that was attempted and aborted on 5/23 as patient was not able to tolerate c-collar removal, lay prone, maintain adequate saturation in addition to difficulty visualizing target for the procedure.  Per NIR, patient is not a candidate for percutaneous kyphoplasty.  Palliative medicine following.    Subjective: Seen and examined earlier this morning.  No major events overnight of this morning.  Complains of left shoulder blade pain.  Asking for IV morphine shot.  I have encouraged her to use oral pain medication and steroids IV.   Objective: Vitals:   10/07/22 0355 10/07/22 0758 10/07/22 0838 10/07/22 0901  BP: 133/69 (!) 169/67    Pulse: (!) 58 60  72  Resp: 14 16    Temp: (!) 97.5 F (36.4 C) 97.6 F (36.4 C)    TempSrc: Oral Oral    SpO2: 96% 95% 95%   Weight:      Height:        Examination:  GENERAL: Appears frail.  No apparent distress. HEENT: Dry oral mucous membrane.  Vision and hearing grossly intact. NECK: Neck collar in place. RESP:  No IWOB.  Fair aeration bilaterally. CVS:  RRR. Heart sounds normal.  ABD/GI/GU: BS+. Abd soft, NTND.  MSK/EXT:   No apparent deformity. Moves extremities.  Significant muscle mass and subcu fat loss.  No signs of injury over left shoulder but some tenderness.  SKIN: Dressing over RLE. NEURO: Awake and alert.  Oriented fairly.  No apparent focal neuro deficit. PSYCH: Appears anxious.  Procedures:  5/23-T3 kyphoplasty aborted.  Microbiology summarized: None  Assessment and plan: Principal Problem:   Subdural hematoma (HCC) Active Problems:   Polycythemia   Essential hypertension   Chronic diastolic CHF (congestive heart failure) (HCC)   PE (pulmonary thromboembolism) (HCC)   GERD (gastroesophageal reflux disease)   Breast cancer (HCC)   Depression with anxiety   Protein-calorie malnutrition, severe   Presence of drug-eluting stent in right coronary artery   Compression fracture of C7 vertebra (HCC)   Compression fracture of T3 vertebra (HCC)  Subdural hematoma, post fall, POA: Initial CT showed 3 mm acute small right parafalcine SDH.  Stable on repeat CT.  Neurosurgery recommended holding Eliquis for 1 week and outpatient follow-up in 2 weeks.  Of note, patient is also on Plavix which could increase her risk of bleeding. -NS rec on 5/18:  -No acute neurosurgical intervention indicated at this time -Continue hard collar, may remove for bathing  -Restart Eliquis and Plavix in 1 week-started on 5/26 -Please call with any concerns or questions  T3 compression fracture: CT showed acute T3 compression fracture with 30 to 40% height loss and mild fragmentation but no cord compression. -T3 kyphoplasty that was attempted and aborted on 5/23 as patient was not able to tolerate c-collar removal, lay prone, maintain adequate saturation in addition to difficulty visualizing target for the procedure.  Per NIR, patient is not a  candidate for percutaneous kyphoplasty.   -Palliative medicine following. -Pain control:  -Discontinued IV morphine.   -Increased oxycodone to every 6 hours as needed. -Continue Tylenol scheduled while awake -Add Lidoderm patch for left shoulder blade pain  C7 compression fracture: CT showed C7 fracture with about 15% height loss and mild anterior bulge -Continue C-collar  until then.  Can only remove it for bathing. -PT/OT recommended SNF.   Acute hypoxic respiratory failure: No respiratory distress but remains on oxygen.  CXR with increased densities at both lung bases concerning for bilateral pleural effusion with basilar atelectasis.  Symptoms are resolved. -Continue breathing treatments -Encourage incentive spirometry   History of CAD/Takotsubo cardiomyopathy: Patient with multivessel CAD.  She underwent PCI with orbital atherectomy, intracoronary lithotripsy and placement of Synergy DES in 02/2022.  Patient with intermittent chest pain likely musculoskeletal.  TTE reassuring.  -Will start Plavix on 5/27 if CBC stable after resuming Eliquis -Continue statin.  Paroxysmal A-fib on Eliquis: Rate controlled. -Continue amiodarone -Resume Eliquis. -Optimize electrolytes   Leukocytosis: Resolved.   Hypokalemia: Resolved.  Mild hyponatremia: Stable -Continue monitoring  GERD -Resume home PPI   Hyperlipidemia -Continue home Crestor.   Chronic anxiety/depression: Feels and looks anxious -Changed Ativan to Klonopin   Goals of care conversation: Remains full code.  -Palliative medicine following  Inadequate oral intake Body mass index is 20.64 kg/m. Nutrition Problem: Inadequate oral intake Etiology: lethargy/confusion, poor appetite Signs/Symptoms: meal completion < 50% Interventions: Ensure Enlive (each supplement provides 350kcal and 20 grams of protein)   DVT prophylaxis:  apixaban (ELIQUIS) tablet 2.5 mg Start: 10/07/22 1000 SCDs Start: 09/29/22 0400 apixaban (ELIQUIS) tablet 2.5 mg  Code Status: Full code Family Communication: None at bedside Level of care: Med-Surg Status is: Inpatient Remains inpatient appropriate because: Due to acute on chronic back pain, thoracic spine compression fracture   Final disposition: SNF on 5/27 Consultants:  Neurosurgery Palliative medicine IR  35 minutes with more than 50% spent in reviewing  records, counseling patient/family and coordinating care.   Sch Meds:  Scheduled Meds:  acetaminophen  650 mg Oral Q6H WA   amiodarone  200 mg Oral Daily   apixaban  2.5 mg Oral BID   clonazePAM  0.5 mg Oral BID   docusate sodium  100 mg Oral BID   feeding supplement  237 mL Oral BID BM   lidocaine  1 patch Transdermal Q24H   metoprolol tartrate  50 mg Oral BID   pantoprazole  40 mg Oral Daily   rosuvastatin  20 mg Oral QHS   sertraline  12.5 mg Oral QHS   Continuous Infusions: PRN Meds:.ipratropium-albuterol, oxyCODONE, polyethylene glycol, prochlorperazine  Antimicrobials: Anti-infectives (From admission, onward)    Start     Dose/Rate Route Frequency Ordered Stop   10/04/22 0000  vancomycin (VANCOCIN) IVPB 1000 mg/200 mL premix        1,000 mg 200 mL/hr over 60 Minutes Intravenous To Radiology 10/03/22 1346 10/04/22 0102        I have personally reviewed the following labs and images: CBC: Recent Labs  Lab 10/01/22 0248 10/02/22 0725 10/05/22 0906 10/07/22 0628  WBC 8.8 9.3 8.9 7.3  HGB 11.9* 12.7 13.5 13.9  HCT 39.8 42.2 44.1 45.4  MCV 83.4 81.3 80.2 80.2  PLT 363 438* 505* 484*   BMP &GFR Recent Labs  Lab 10/01/22 0248 10/02/22 0725 10/05/22 0906 10/07/22 0628  NA 131* 132* 131* 130*  K 3.4* 4.0 3.9 4.1  CL 94* 95* 93* 95*  CO2  30 30 30 26   GLUCOSE 113* 126* 110* 89  BUN 11 9 9 8   CREATININE 0.54 0.57 0.55 0.65  CALCIUM 7.8* 8.5* 8.5* 8.4*  MG 2.1 2.1 2.0 2.0  PHOS  --   --  2.8 3.2   Estimated Creatinine Clearance: 41.9 mL/min (by C-G formula based on SCr of 0.65 mg/dL). Liver & Pancreas: Recent Labs  Lab 10/05/22 0906 10/07/22 0628  ALBUMIN 2.9* 2.8*   No results for input(s): "LIPASE", "AMYLASE" in the last 168 hours. No results for input(s): "AMMONIA" in the last 168 hours. Diabetic: No results for input(s): "HGBA1C" in the last 72 hours. Recent Labs  Lab 10/06/22 2045  GLUCAP 117*   Cardiac Enzymes: No results for input(s):  "CKTOTAL", "CKMB", "CKMBINDEX", "TROPONINI" in the last 168 hours. No results for input(s): "PROBNP" in the last 8760 hours. Coagulation Profile: No results for input(s): "INR", "PROTIME" in the last 168 hours.  Thyroid Function Tests: No results for input(s): "TSH", "T4TOTAL", "FREET4", "T3FREE", "THYROIDAB" in the last 72 hours. Lipid Profile: No results for input(s): "CHOL", "HDL", "LDLCALC", "TRIG", "CHOLHDL", "LDLDIRECT" in the last 72 hours. Anemia Panel: No results for input(s): "VITAMINB12", "FOLATE", "FERRITIN", "TIBC", "IRON", "RETICCTPCT" in the last 72 hours. Urine analysis:    Component Value Date/Time   COLORURINE STRAW (A) 02/27/2022 1639   APPEARANCEUR CLEAR 02/27/2022 1639   LABSPEC 1.004 (L) 02/27/2022 1639   LABSPEC 1.010 05/27/2013 1433   PHURINE 7.0 02/27/2022 1639   GLUCOSEU NEGATIVE 02/27/2022 1639   GLUCOSEU NEGATIVE 09/02/2017 1347   HGBUR NEGATIVE 02/27/2022 1639   HGBUR negative 10/06/2009 1015   BILIRUBINUR NEGATIVE 02/27/2022 1639   BILIRUBINUR Negative 01/21/2018 1618   KETONESUR NEGATIVE 02/27/2022 1639   PROTEINUR NEGATIVE 02/27/2022 1639   UROBILINOGEN negative (A) 01/21/2018 1618   UROBILINOGEN 0.2 09/02/2017 1347   UROBILINOGEN 0.2 05/27/2013 1433   NITRITE NEGATIVE 02/27/2022 1639   LEUKOCYTESUR NEGATIVE 02/27/2022 1639   Sepsis Labs: Invalid input(s): "PROCALCITONIN", "LACTICIDVEN"  Microbiology: Recent Results (from the past 240 hour(s))  Surgical pcr screen     Status: None   Collection Time: 10/04/22  7:29 AM   Specimen: Nasal Mucosa; Nasal Swab  Result Value Ref Range Status   MRSA, PCR NEGATIVE NEGATIVE Final   Staphylococcus aureus NEGATIVE NEGATIVE Final    Comment: (NOTE) The Xpert SA Assay (FDA approved for NASAL specimens in patients 67 years of age and older), is one component of a comprehensive surveillance program. It is not intended to diagnose infection nor to guide or monitor treatment. Performed at [facility], [address] [place], [place] [address]     Radiology Studies: No results found.    Ledonna Dormer T. Philbert Ocallaghan [facility]  If 7PM-7AM, please contact night-coverage [email or web address] 10/07/2022, 11:10 AM

## 2022-10-07 NOTE — Plan of Care (Signed)

## 2022-10-08 DIAGNOSIS — I10 Essential (primary) hypertension: Secondary | ICD-10-CM | POA: Diagnosis not present

## 2022-10-08 DIAGNOSIS — I2699 Other pulmonary embolism without acute cor pulmonale: Secondary | ICD-10-CM | POA: Diagnosis not present

## 2022-10-08 DIAGNOSIS — S065XAA Traumatic subdural hemorrhage with loss of consciousness status unknown, initial encounter: Secondary | ICD-10-CM | POA: Diagnosis not present

## 2022-10-08 DIAGNOSIS — C50011 Malignant neoplasm of nipple and areola, right female breast: Secondary | ICD-10-CM | POA: Diagnosis not present

## 2022-10-08 LAB — CBC
HCT: 44.5 % (ref 36.0–46.0)
Hemoglobin: 13.5 g/dL (ref 12.0–15.0)
MCH: 24.7 pg — ABNORMAL LOW (ref 26.0–34.0)
MCHC: 30.3 g/dL (ref 30.0–36.0)
MCV: 81.4 fL (ref 80.0–100.0)
Platelets: 483 10*3/uL — ABNORMAL HIGH (ref 150–400)
RBC: 5.47 MIL/uL — ABNORMAL HIGH (ref 3.87–5.11)
RDW: 17 % — ABNORMAL HIGH (ref 11.5–15.5)
WBC: 7.1 10*3/uL (ref 4.0–10.5)
nRBC: 0 % (ref 0.0–0.2)

## 2022-10-08 LAB — RENAL FUNCTION PANEL
Albumin: 2.7 g/dL — ABNORMAL LOW (ref 3.5–5.0)
Anion gap: 8 (ref 5–15)
BUN: 8 mg/dL (ref 8–23)
CO2: 28 mmol/L (ref 22–32)
Calcium: 8.2 mg/dL — ABNORMAL LOW (ref 8.9–10.3)
Chloride: 95 mmol/L — ABNORMAL LOW (ref 98–111)
Creatinine, Ser: 0.63 mg/dL (ref 0.44–1.00)
GFR, Estimated: >60 mL/min (ref >60)
Glucose, Bld: 112 mg/dL — ABNORMAL HIGH (ref 70–99)
Phosphorus: 3.3 mg/dL (ref 2.5–4.6)
Potassium: 3.7 mmol/L (ref 3.5–5.1)
Sodium: 131 mmol/L — ABNORMAL LOW (ref 135–145)

## 2022-10-08 LAB — MAGNESIUM: Magnesium: 1.9 mg/dL (ref 1.7–2.4)

## 2022-10-08 MED ORDER — LORAZEPAM 0.5 MG PO TABS: 0.5000 mg | ORAL_TABLET | Freq: Two times a day (BID) | ORAL | Status: DC | PRN

## 2022-10-08 MED ORDER — AMLODIPINE BESYLATE 5 MG PO TABS
5.0000 mg | ORAL_TABLET | Freq: Every day | ORAL | Status: DC
  Administered 2022-10-08 – 2022-10-09 (×2): 5 mg via ORAL
  Filled 2022-10-08 (×2): qty 1

## 2022-10-08 MED ORDER — LORAZEPAM 0.5 MG PO TABS
0.5000 mg | ORAL_TABLET | Freq: Two times a day (BID) | ORAL | Status: DC | PRN
  Administered 2022-10-08: 0.5 mg via ORAL
  Filled 2022-10-08: qty 1

## 2022-10-08 MED ORDER — CLOPIDOGREL BISULFATE 75 MG PO TABS
75.0000 mg | ORAL_TABLET | Freq: Every day | ORAL | Status: DC
  Administered 2022-10-08 – 2022-10-09 (×2): 75 mg via ORAL
  Filled 2022-10-08 (×2): qty 1

## 2022-10-08 NOTE — Plan of Care (Signed)

## 2022-10-08 NOTE — Progress Notes (Signed)
Physical Therapy Treatment Patient Details Name: [name] MRN: [id number] DOB: 04-Nov-1929 Today's Date: 10/08/2022   History of Present Illness [name] is a 89 y.o. female who presented to [facility] ED from SNF due to an unwitnessed fall. Imaging revealed subdural hematoma seen on CT, new T3 and C7 compression fracture. PMHx:  osteoporosis,T9,10,11, L1 and L2 compression fxs w/ Kyphoplasty, L hip fracture, OA, CVA, CAD, CA, HTN    PT Comments    Pt admitted with above diagnosis. Pt was able to ambulate with RW with mod cues and mod assist of 2 for safety.  Pt with heavy posterior lean and narrow BOS.  Still needs therapy at d/c as pt requires 2 person assist.  Pt currently with functional limitations due to balance and endurance deficits. Pt will benefit from acute skilled PT to increase their independence and safety with mobility to allow discharge.      Recommendations for follow up therapy are one component of a multi-disciplinary discharge planning process, led by the attending physician.  Recommendations may be updated based on patient status, additional functional criteria and insurance authorization.  Follow Up Recommendations  Can patient physically be transported by private vehicle: No    Assistance Recommended at Discharge Frequent or constant Supervision/Assistance  Patient can return home with the following Two people to help with walking and/or transfers;Two people to help with bathing/dressing/bathroom   Equipment Recommendations  Other (comment) (defer to next venue)    Recommendations for Other Services       Precautions / Restrictions Precautions Precautions: Fall;Cervical Precaution Booklet Issued: No Required Braces or Orthoses: Cervical Brace Cervical Brace: Hard collar;At all times Restrictions Weight Bearing Restrictions: No     Mobility  Bed Mobility Overal bed mobility: Needs Assistance Bed Mobility: Rolling, Sit to Sidelying Rolling: Min  assist   Supine to sit: +2 for physical assistance, +2 for safety/equipment, Max assist     General bed mobility comments: Neck brace lying in bed on arrival. Called nurse to let her know pt had removed brace.  REapplied neck brace.  This pt needed assist to come to eOB as she was initiailly hesitant to get up OOB.  cues for log roll.    Transfers Overall transfer level: Needs assistance Equipment used: Rolling walker (2 wheels) Transfers: Sit to/from Stand Sit to Stand: +2 physical assistance, +2 safety/equipment, Mod assist, From elevated surface           General transfer comment: mod A+2 to power up, pt with posterior lean.    Ambulation/Gait Ambulation/Gait assistance: Mod assist, +2 safety/equipment Gait Distance (Feet): 50 Feet Assistive device: Rolling walker (2 wheels) Gait Pattern/deviations: Step-to pattern, Decreased step length - right, Decreased step length - left, Decreased stride length, Antalgic, Leaning posteriorly, Staggering left, Staggering right, Wide base of support   Gait velocity interpretation: <1.31 ft/sec, indicative of household ambulator   General Gait Details: Pt needing alot of encouragement and assist to ambulate 50 feet wtih RW and +2 assist.  1 person for chair follow and second person assisting pt with anterior lean and upright posture. Pt needed cues to separate feet as she narrows BOS making her more unsteady.  Heavy assist and cues to get pt to walk 50 feet.   Stairs             Wheelchair Mobility    Modified Rankin (Stroke Patients Only)       Balance Overall balance assessment: Needs assistance Sitting-balance support: Feet supported, No upper  extremity supported, Bilateral upper extremity supported Sitting balance-Leahy Scale: Fair     Standing balance support: Bilateral upper extremity supported, During functional activity Standing balance-Leahy Scale: Poor Standing balance comment: relies on external and UE support                             Cognition Arousal/Alertness: Awake/alert Behavior During Therapy: WFL for tasks assessed/performed Overall Cognitive Status: Within Functional Limits for tasks assessed                                 General Comments: tangential thought/verbose        Exercises General Exercises - Lower Extremity Long Arc Quad: AROM, Both, 5 reps, Seated    General Comments General comments (skin integrity, edema, etc.): VSS      Pertinent Vitals/Pain Pain Assessment Pain Assessment: Faces Faces Pain Scale: Hurts even more Breathing: normal Negative Vocalization: none Facial Expression: smiling or inexpressive Body Language: relaxed Consolability: no need to console PAINAD Score: 0 Pain Location: L shoulderblade, ribs Pain Descriptors / Indicators: Discomfort, Grimacing, Guarding Pain Intervention(s): Limited activity within patient's tolerance, Monitored during session, Repositioned    Home Living                          Prior Function            PT Goals (current goals can now be found in the care plan section) Acute Rehab PT Goals Patient Stated Goal: decrease pain Progress towards PT goals: Progressing toward goals    Frequency    Min 3X/week      PT Plan Current plan remains appropriate    Co-evaluation              AM-PAC PT "6 Clicks" Mobility   Outcome Measure  Help needed turning from your back to your side while in a flat bed without using bedrails?: A Lot Help needed moving from lying on your back to sitting on the side of a flat bed without using bedrails?: A Lot Help needed moving to and from a bed to a chair (including a wheelchair)?: Total Help needed standing up from a chair using your arms (e.g., wheelchair or bedside chair)?: Total Help needed to walk in hospital room?: Total Help needed climbing 3-5 steps with a railing? : Total 6 Click Score: 8    End of Session Equipment  Utilized During Treatment: Cervical collar Activity Tolerance: Patient limited by pain Patient left: in chair;with call bell/phone within reach;with chair alarm set Nurse Communication: Mobility status [name]) PT Visit Diagnosis: Other abnormalities of gait and mobility (R26.89);Pain     Time: 4696-2952 PT Time Calculation (min) (ACUTE ONLY): 25 min  Charges:  $Gait Training: 8-22 mins $Therapeutic Exercise: 8-22 mins                     [facility] M,PT Acute Rehab Services [phone]    Bevelyn Buckles 10/08/2022, 2:35 PM

## 2022-10-08 NOTE — Progress Notes (Addendum)
PROGRESS NOTE  [name] ZOX:[id number] DOB: 01-21-1930   PCP: [facility]  Patient is from: SNF  DOA: 09/28/2022 LOS: 9  Chief complaints Chief Complaint  Patient presents with   Fall     Brief Narrative / Interim history: 89 year old F with PMH of HTN, polycythemia, depression, GERD, CVA, PAF/PE/DVT on Eliquis, multiple prior kyphoplasty presented to hospital with complaints of an unwitnessed fall and admitted from subdural hematoma and, C7 and T3 compression fracture. Found to have a T3 and C7 compression fracture. Neurosurgery recommended conservative management.  IR consulted for T3 kyphoplasty that was attempted and aborted on 5/23 as patient was not able to tolerate c-collar removal, lay prone, maintain adequate saturation in addition to difficulty visualizing target for the procedure.  Per NIR, patient is not a candidate for percutaneous kyphoplasty.  Eliquis resumed on 5/26.  Plavix resumed on 5/27.  Palliative medicine recommended outpatient follow-up.  Therapy recommended return to SNF.  Patient is stable for discharge back to SNF    Subjective: Seen and examined earlier this morning.  No major events overnight of this morning.  Complaining of back pain and left shoulder blade pain.  She says the Lidoderm patch helped with the left shoulder blade pain.   Objective: Vitals:   10/07/22 2207 10/08/22 0416 10/08/22 0753 10/08/22 0912  BP: (!) 169/74 (!) 167/67 136/67   Pulse: 63 (!) 54 (!) 56 64  Resp: 18 18 16    Temp: 97.6 F (36.4 C) (!) 97.3 F (36.3 C) (!) 97.5 F (36.4 C)   TempSrc: Oral     SpO2: 90% 95% 93%   Weight:      Height:        Examination:  GENERAL: Appears frail.  No apparent distress. HEENT: Dry oral mucous membrane.  Vision and hearing grossly intact. NECK: Neck collar in place. RESP:  No IWOB.  Fair aeration bilaterally. CVS:  RRR. Heart sounds normal.  ABD/GI/GU: BS+. Abd soft, NTND.  MSK/EXT:   No apparent  deformity. Moves extremities.  Significant muscle mass and subcu fat loss. SKIN: Dressing over RLE. NEURO: Awake. Oriented fairly.  No apparent focal neuro deficit. PSYCH: Appears anxious.  Procedures:  5/23-T3 kyphoplasty aborted.  Microbiology summarized: None  Assessment and plan: Principal Problem:   Subdural hematoma (HCC) Active Problems:   Polycythemia   Essential hypertension   Chronic diastolic CHF (congestive heart failure) (HCC)   PE (pulmonary thromboembolism) (HCC)   GERD (gastroesophageal reflux disease)   Breast cancer (HCC)   Depression with anxiety   Protein-calorie malnutrition, severe   Presence of drug-eluting stent in right coronary artery   Compression fracture of C7 vertebra (HCC)   Compression fracture of T3 vertebra (HCC)  Subdural hematoma, post fall, POA: Initial CT showed 3 mm acute small right parafalcine SDH.  Stable on repeat CT.  Neurosurgery recommended holding Eliquis for 1 week and outpatient follow-up in 2 weeks.  Of note, patient is also on Plavix which could increase her risk of bleeding. -NS rec on 5/18:  -No acute neurosurgical intervention indicated at this time -Continue hard collar, may remove for bathing  -Restarted Eliquis and Plavix in 1 week-started on 5/26  T3 compression fracture: CT showed acute T3 compression fracture with 30 to 40% height loss and mild fragmentation but no cord compression. -T3 kyphoplasty that was attempted and aborted on 5/23 as patient was not able to tolerate c-collar removal, lay prone, maintain adequate saturation in addition to difficulty visualizing target for  the procedure.  Per NIR, patient is not a candidate for percutaneous kyphoplasty.   -Palliative medicine following. -Pain control:  -Discontinued IV morphine on 5/26.   -Increased oxycodone to every 6 hours as needed. -Continue Tylenol scheduled while awake -Continue Lidoderm patch for left shoulder blade pain  C7 compression fracture: CT  showed C7 fracture with about 15% height loss and mild anterior bulge -Continue C-collar until then.  Can only remove it for bathing. -PT/OT recommended SNF.   Acute hypoxic respiratory failure: No respiratory distress but remains on oxygen.  CXR with increased densities at both lung bases concerning for bilateral pleural effusion with basilar atelectasis.  Symptoms are resolved. -Continue breathing treatments -Encourage incentive spirometry   History of CAD/Takotsubo cardiomyopathy: Patient with multivessel CAD.  She underwent PCI with orbital atherectomy, intracoronary lithotripsy and placement of Synergy DES in 02/2022.  Patient with intermittent chest pain likely musculoskeletal.  TTE reassuring.  -Start Plavix Plavix -Continue statin.  Paroxysmal A-fib on Eliquis: Rate controlled. -Continue amiodarone -Resumed Eliquis on 5/26. -Optimize electrolytes   Leukocytosis: Resolved.   Hypokalemia: Resolved.  Mild hyponatremia: Stable -Continue monitoring  GERD -Resume home PPI   Hyperlipidemia -Continue home Crestor.   Chronic anxiety/depression: Not quite alert since Klonopin. -Change Klonopin back to home lorazepam   Goals of care conversation: Remains full code.  -Palliative medicine following  Inadequate oral intake Body mass index is 20.64 kg/m. Nutrition Problem: Inadequate oral intake Etiology: lethargy/confusion, poor appetite Signs/Symptoms: meal completion < 50% Interventions: Ensure Enlive (each supplement provides 350kcal and 20 grams of protein)   DVT prophylaxis:  apixaban (ELIQUIS) tablet 2.5 mg Start: 10/07/22 1000 SCDs Start: 09/29/22 0400 apixaban (ELIQUIS) tablet 2.5 mg  Code Status: Full code Family Communication: None at bedside Level of care: Med-Surg Status is: Inpatient Remains inpatient appropriate because: Due to acute on chronic back pain, thoracic spine compression fracture   Final disposition: SNF.  Medically stable for  discharge. Consultants:  Neurosurgery Palliative medicine IR  35 minutes with more than 50% spent in reviewing records, counseling patient/family and coordinating care.   Sch Meds:  Scheduled Meds:  acetaminophen  650 mg Oral Q6H WA   amiodarone  200 mg Oral Daily   amLODipine  5 mg Oral Daily   apixaban  2.5 mg Oral BID   clopidogrel  75 mg Oral Daily   docusate sodium  100 mg Oral BID   feeding supplement  237 mL Oral BID BM   lidocaine  1 patch Transdermal Q24H   metoprolol tartrate  50 mg Oral BID   pantoprazole  40 mg Oral Daily   rosuvastatin  20 mg Oral QHS   sertraline  12.5 mg Oral QHS   Continuous Infusions: PRN Meds:.ipratropium-albuterol, LORazepam, oxyCODONE, polyethylene glycol, prochlorperazine  Antimicrobials: Anti-infectives (From admission, onward)    Start     Dose/Rate Route Frequency Ordered Stop   10/04/22 0000  vancomycin (VANCOCIN) IVPB 1000 mg/200 mL premix        1,000 mg 200 mL/hr over 60 Minutes Intravenous To Radiology 10/03/22 1346 10/04/22 0102        I have personally reviewed the following labs and images: CBC: Recent Labs  Lab 10/02/22 0725 10/05/22 0906 10/07/22 0628 10/08/22 0456  WBC 9.3 8.9 7.3 7.1  HGB 12.7 13.5 13.9 13.5  HCT 42.2 44.1 45.4 44.5  MCV 81.3 80.2 80.2 81.4  PLT 438* 505* 484* 483*   BMP &GFR Recent Labs  Lab 10/02/22 0725 10/05/22 1308 10/07/22 6578 10/08/22 0456  NA 132* 131* 130* 131*  K 4.0 3.9 4.1 3.7  CL 95* 93* 95* 95*  CO2 30 30 26 28   GLUCOSE 126* 110* 89 112*  BUN 9 9 8 8   CREATININE 0.57 0.55 0.65 0.63  CALCIUM 8.5* 8.5* 8.4* 8.2*  MG 2.1 2.0 2.0 1.9  PHOS  --  2.8 3.2 3.3   Estimated Creatinine Clearance: 41.9 mL/min (by C-G formula based on SCr of 0.63 mg/dL). Liver & Pancreas: Recent Labs  Lab 10/05/22 0906 10/07/22 0628 10/08/22 0456  ALBUMIN 2.9* 2.8* 2.7*   No results for input(s): "LIPASE", "AMYLASE" in the last 168 hours. No results for input(s): "AMMONIA" in the  last 168 hours. Diabetic: No results for input(s): "HGBA1C" in the last 72 hours. Recent Labs  Lab 10/06/22 2045  GLUCAP 117*   Cardiac Enzymes: No results for input(s): "CKTOTAL", "CKMB", "CKMBINDEX", "TROPONINI" in the last 168 hours. No results for input(s): "PROBNP" in the last 8760 hours. Coagulation Profile: No results for input(s): "INR", "PROTIME" in the last 168 hours.  Thyroid Function Tests: No results for input(s): "TSH", "T4TOTAL", "FREET4", "T3FREE", "THYROIDAB" in the last 72 hours. Lipid Profile: No results for input(s): "CHOL", "HDL", "LDLCALC", "TRIG", "CHOLHDL", "LDLDIRECT" in the last 72 hours. Anemia Panel: No results for input(s): "VITAMINB12", "FOLATE", "FERRITIN", "TIBC", "IRON", "RETICCTPCT" in the last 72 hours. Urine analysis:    Component Value Date/Time   COLORURINE STRAW (A) 02/27/2022 1639   APPEARANCEUR CLEAR 02/27/2022 1639   LABSPEC 1.004 (L) 02/27/2022 1639   LABSPEC 1.010 05/27/2013 1433   PHURINE 7.0 02/27/2022 1639   GLUCOSEU NEGATIVE 02/27/2022 1639   GLUCOSEU NEGATIVE 09/02/2017 1347   HGBUR NEGATIVE 02/27/2022 1639   HGBUR negative 10/06/2009 1015   BILIRUBINUR NEGATIVE 02/27/2022 1639   BILIRUBINUR Negative 01/21/2018 1618   KETONESUR NEGATIVE 02/27/2022 1639   PROTEINUR NEGATIVE 02/27/2022 1639   UROBILINOGEN negative (A) 01/21/2018 1618   UROBILINOGEN 0.2 09/02/2017 1347   UROBILINOGEN 0.2 05/27/2013 1433   NITRITE NEGATIVE 02/27/2022 1639   LEUKOCYTESUR NEGATIVE 02/27/2022 1639   Sepsis Labs: Invalid input(s): "PROCALCITONIN", "LACTICIDVEN"  Microbiology: Recent Results (from the past 240 hour(s))  Surgical pcr screen     Status: None   Collection Time: 10/04/22  7:29 AM   Specimen: Nasal Mucosa; Nasal Swab  Result Value Ref Range Status   MRSA, PCR NEGATIVE NEGATIVE Final   Staphylococcus aureus NEGATIVE NEGATIVE Final    Comment: (NOTE) The Xpert SA Assay (FDA approved for NASAL specimens in patients 63 years of age  and older), is one component of a comprehensive surveillance program. It is not intended to diagnose infection nor to guide or monitor treatment. Performed at [facility], [address] [place], [place] [address]     Radiology Studies: No results found.    Keirra Zeimet T. Dayanara Sherrill [facility]  If 7PM-7AM, please contact night-coverage [email or web address] 10/08/2022, 1:52 PM

## 2022-10-09 DIAGNOSIS — Z9181 History of falling: Secondary | ICD-10-CM | POA: Diagnosis not present

## 2022-10-09 DIAGNOSIS — R2681 Unsteadiness on feet: Secondary | ICD-10-CM | POA: Diagnosis not present

## 2022-10-09 DIAGNOSIS — S42291D Other displaced fracture of upper end of right humerus, subsequent encounter for fracture with routine healing: Secondary | ICD-10-CM | POA: Diagnosis not present

## 2022-10-09 DIAGNOSIS — S12000A Unspecified displaced fracture of first cervical vertebra, initial encounter for closed fracture: Secondary | ICD-10-CM | POA: Diagnosis not present

## 2022-10-09 DIAGNOSIS — S065XAD Traumatic subdural hemorrhage with loss of consciousness status unknown, subsequent encounter: Secondary | ICD-10-CM | POA: Diagnosis not present

## 2022-10-09 DIAGNOSIS — I5032 Chronic diastolic (congestive) heart failure: Secondary | ICD-10-CM

## 2022-10-09 DIAGNOSIS — Z8673 Personal history of transient ischemic attack (TIA), and cerebral infarction without residual deficits: Secondary | ICD-10-CM | POA: Diagnosis not present

## 2022-10-09 DIAGNOSIS — S22030D Wedge compression fracture of third thoracic vertebra, subsequent encounter for fracture with routine healing: Secondary | ICD-10-CM | POA: Diagnosis not present

## 2022-10-09 DIAGNOSIS — S2241XD Multiple fractures of ribs, right side, subsequent encounter for fracture with routine healing: Secondary | ICD-10-CM | POA: Diagnosis not present

## 2022-10-09 DIAGNOSIS — I25110 Atherosclerotic heart disease of native coronary artery with unstable angina pectoris: Secondary | ICD-10-CM | POA: Diagnosis not present

## 2022-10-09 DIAGNOSIS — I480 Paroxysmal atrial fibrillation: Secondary | ICD-10-CM | POA: Diagnosis not present

## 2022-10-09 DIAGNOSIS — C50919 Malignant neoplasm of unspecified site of unspecified female breast: Secondary | ICD-10-CM | POA: Diagnosis not present

## 2022-10-09 DIAGNOSIS — I2111 ST elevation (STEMI) myocardial infarction involving right coronary artery: Secondary | ICD-10-CM | POA: Diagnosis not present

## 2022-10-09 DIAGNOSIS — I428 Other cardiomyopathies: Secondary | ICD-10-CM | POA: Diagnosis not present

## 2022-10-09 DIAGNOSIS — S22030S Wedge compression fracture of third thoracic vertebra, sequela: Secondary | ICD-10-CM | POA: Diagnosis not present

## 2022-10-09 DIAGNOSIS — C50011 Malignant neoplasm of nipple and areola, right female breast: Secondary | ICD-10-CM | POA: Diagnosis not present

## 2022-10-09 DIAGNOSIS — I2699 Other pulmonary embolism without acute cor pulmonale: Secondary | ICD-10-CM | POA: Diagnosis not present

## 2022-10-09 DIAGNOSIS — J9601 Acute respiratory failure with hypoxia: Secondary | ICD-10-CM | POA: Diagnosis not present

## 2022-10-09 DIAGNOSIS — S065XAA Traumatic subdural hemorrhage with loss of consciousness status unknown, initial encounter: Secondary | ICD-10-CM | POA: Diagnosis not present

## 2022-10-09 DIAGNOSIS — E43 Unspecified severe protein-calorie malnutrition: Secondary | ICD-10-CM | POA: Diagnosis not present

## 2022-10-09 DIAGNOSIS — Z955 Presence of coronary angioplasty implant and graft: Secondary | ICD-10-CM | POA: Diagnosis not present

## 2022-10-09 DIAGNOSIS — M40204 Unspecified kyphosis, thoracic region: Secondary | ICD-10-CM | POA: Diagnosis not present

## 2022-10-09 DIAGNOSIS — M6281 Muscle weakness (generalized): Secondary | ICD-10-CM | POA: Diagnosis not present

## 2022-10-09 DIAGNOSIS — R2689 Other abnormalities of gait and mobility: Secondary | ICD-10-CM | POA: Diagnosis not present

## 2022-10-09 DIAGNOSIS — S12690A Other displaced fracture of seventh cervical vertebra, initial encounter for closed fracture: Secondary | ICD-10-CM | POA: Diagnosis not present

## 2022-10-09 DIAGNOSIS — Z7401 Bed confinement status: Secondary | ICD-10-CM | POA: Diagnosis not present

## 2022-10-09 DIAGNOSIS — I10 Essential (primary) hypertension: Secondary | ICD-10-CM | POA: Diagnosis not present

## 2022-10-09 DIAGNOSIS — I7300 Raynaud's syndrome without gangrene: Secondary | ICD-10-CM | POA: Diagnosis not present

## 2022-10-09 DIAGNOSIS — S22000B Wedge compression fracture of unspecified thoracic vertebra, initial encounter for open fracture: Secondary | ICD-10-CM | POA: Diagnosis not present

## 2022-10-09 DIAGNOSIS — S72141D Displaced intertrochanteric fracture of right femur, subsequent encounter for closed fracture with routine healing: Secondary | ICD-10-CM | POA: Diagnosis not present

## 2022-10-09 DIAGNOSIS — R531 Weakness: Secondary | ICD-10-CM | POA: Diagnosis not present

## 2022-10-09 DIAGNOSIS — D500 Iron deficiency anemia secondary to blood loss (chronic): Secondary | ICD-10-CM | POA: Diagnosis not present

## 2022-10-09 DIAGNOSIS — Z743 Need for continuous supervision: Secondary | ICD-10-CM | POA: Diagnosis not present

## 2022-10-09 DIAGNOSIS — S065X0D Traumatic subdural hemorrhage without loss of consciousness, subsequent encounter: Secondary | ICD-10-CM | POA: Diagnosis not present

## 2022-10-09 LAB — CBC
HCT: 41.1 % (ref 36.0–46.0)
Hemoglobin: 12.8 g/dL (ref 12.0–15.0)
MCH: 25 pg — ABNORMAL LOW (ref 26.0–34.0)
MCHC: 31.1 g/dL (ref 30.0–36.0)
MCV: 80.3 fL (ref 80.0–100.0)
Platelets: 466 10*3/uL — ABNORMAL HIGH (ref 150–400)
RBC: 5.12 MIL/uL — ABNORMAL HIGH (ref 3.87–5.11)
RDW: 16.8 % — ABNORMAL HIGH (ref 11.5–15.5)
WBC: 8.1 10*3/uL (ref 4.0–10.5)
nRBC: 0 % (ref 0.0–0.2)

## 2022-10-09 MED ORDER — OXYCODONE HCL 5 MG PO TABS: 5.0000 mg | ORAL_TABLET | Freq: Three times a day (TID) | ORAL | 0 refills | Status: AC | PRN

## 2022-10-09 MED ORDER — ACETAMINOPHEN 500 MG PO TABS: ORAL_TABLET | ORAL | 0 refills | Status: AC

## 2022-10-09 MED ORDER — LORAZEPAM 0.5 MG PO TABS: 0.5000 mg | ORAL_TABLET | Freq: Two times a day (BID) | ORAL | 0 refills | Status: AC

## 2022-10-09 MED ORDER — LIDOCAINE 5 % EX PTCH: 1.0000 | MEDICATED_PATCH | CUTANEOUS | 0 refills | Status: AC

## 2022-10-09 NOTE — TOC Transition Note (Signed)
Transition of Care [facility]) - CM/SW Discharge Note   Patient Details  Name: [name] MRN: [id number] Date of Birth: May 23, 1929  [facility] CM/SW Contact:  Shilo Pauwels A [place], Theresia Majors Phone Number: 10/09/2022, 12:43 PM   Clinical Narrative:     Patient will DC to: [facility]  Anticipated DC date: 10/09/22  Family notified: [name]  Transport by: Sharin Mons      Per MD patient ready for DC to [facility]. RN, patient, patient's family, and facility notified of DC. Discharge Summary and FL2 sent to facility. RN to call report prior to discharge ( Room 110, [phone]). DC packet on chart. Ambulance transport requested for patient.     CSW will sign off for now as social work intervention is no longer needed. Please consult [place] again if new needs arise.    Final next level of care: Acute to Acute Transfer Barriers to Discharge: Barriers Resolved   Patient Goals and CMS Choice      Discharge Placement                Patient chooses bed at: [facility] Patient to be transferred to facility by: [facility] Name of family member notified: [name] (Daughter)  [phone] Patient and family notified of of transfer: 10/09/22  Discharge Plan and Services Additional resources added to the After Visit Summary for   In-house Referral: Clinical Social Work                                   Social Determinants of Health (SDOH) Interventions SDOH Screenings   Food Insecurity: No Food Insecurity (07/24/2022)  Housing: Low Risk  (07/24/2022)  Transportation Needs: No Transportation Needs (07/24/2022)  Utilities: Not At Risk (07/24/2022)  Depression (PHQ2-9): Low Risk  (12/19/2021)  Financial Resource Strain: Low Risk  (08/10/2021)  Tobacco Use: High Risk (10/05/2022)     Readmission Risk Interventions     No data to display

## 2022-10-09 NOTE — Discharge Summary (Signed)
Physician Discharge Summary  DURA DOUTHAT ZOX:[id number] DOB: 1929-12-13 DOA: 09/28/2022  PCP: [facility]  Admit date: 09/28/2022 Discharge date: 10/09/2022 Admitted From: ALF Disposition: SNF Recommendations for Outpatient Follow-up:  Follow up with neurosurgery as below Continue neck collar until follow-up with neurosurgery Check CMP and CBC in 1 week Recommend palliative follow-up at SNF Please follow up on the following pending results: None   Discharge Condition: Stable CODE STATUS: Full code  Contact information for follow-up providers     [facility]. Schedule an appointment as soon as possible for a visit in 2 week(s).   Specialty: Neurosurgery Contact information: [address] [place] [address] [phone]              Contact information for after-discharge care     Destination     [facility] Preferred SNF .   Service: Skilled Nursing Contact information: [address] [place] [address] [phone]                     Hospital course 89 year old F with PMH of HTN, polycythemia, depression, GERD, CVA, PAF/PE/DVT on Eliquis, multiple prior kyphoplasty presented to hospital with complaints of an unwitnessed fall and admitted from subdural hematoma and, C7 and T3 compression fracture. Found to have a T3 and C7 compression fracture. Neurosurgery recommended conservative management with neck collar.  IR consulted for T3 kyphoplasty that was attempted and aborted on 5/23 as patient was not able to tolerate c-collar removal, lay prone, maintain adequate saturation in addition to difficulty visualizing target for the procedure.  Per NIR, patient is not a candidate for percutaneous kyphoplasty.  Eliquis resumed on 5/26.  Plavix resumed on 5/27.  H&H remained stable.  Palliative medicine met with patient and family and recommended outpatient follow-up.   Therapy recommended SNF.  See individual problem list below for more.   Problems addressed during this hospitalization Principal Problem:   Subdural hematoma (HCC) Active Problems:   Polycythemia   Essential hypertension   Chronic diastolic CHF (congestive heart failure) (HCC)   PE (pulmonary thromboembolism) (HCC)   GERD (gastroesophageal reflux disease)   Breast cancer (HCC)   Depression with anxiety   Protein-calorie malnutrition, severe   Presence of drug-eluting stent in right coronary artery   Compression fracture of C7 vertebra (HCC)   Compression fracture of T3 vertebra (HCC)   Subdural hematoma, post fall, POA: Initial CT showed 3 mm acute small right parafalcine SDH.  Stable on repeat CT.  Neurosurgery recommended holding Eliquis for 1 week and outpatient follow-up in 2 weeks.  Of note, patient is also on Plavix which could increase her risk of bleeding. -NS rec on 5/18:             -No acute neurosurgical intervention indicated at this time -Continue hard collar, may remove for bathing  -Restarted Eliquis and Plavix in 1 week-restarted on 5/26 -Outpatient follow-up with neurosurgery in 1 to 2 weeks   T3 compression fracture: CT showed acute T3 compression fracture with 30 to 40% height loss and mild fragmentation but no cord compression. -T3 kyphoplasty that was attempted and aborted on 5/23 as patient was not able to tolerate c-collar removal, lay prone, maintain adequate saturation in addition to difficulty visualizing target for the procedure.  Per NIR, patient is not a candidate for percutaneous kyphoplasty.   -Palliative medicine following. -Pain control:  -Scheduled Tylenol 1000 mg every 8  hours for 5 days and then as needed -P.o. oxycodone 5 mg every 8 hours as needed for moderate to severe pain -Lidoderm patch -PT/OT   C7 compression fracture: CT showed C7 fracture with about 15% height loss and mild anterior bulge -Continue C-collar until then.  Can only  remove it for bathing. -PT/OT recommended SNF.   Acute hypoxic respiratory failure: No respiratory distress but remains on oxygen.  CXR with increased densities at both lung bases concerning for bilateral pleural effusion with basilar atelectasis.  Resolved.   History of CAD/Takotsubo cardiomyopathy: Patient with multivessel CAD.  She underwent PCI with orbital atherectomy, intracoronary lithotripsy and placement of Synergy DES in 02/2022.  Patient with intermittent chest pain likely musculoskeletal.  TTE reassuring.  -Resumed Plavix on 5/27. -Continue statin.   Paroxysmal A-fib on Eliquis: Rate controlled. -Continue amiodarone and Eliquis -Optimize electrolytes   Leukocytosis: Resolved.   Hypokalemia: Resolved.   Mild hyponatremia: Stable -Continue monitoring   GERD -Resume home PPI   Hyperlipidemia -Continue home Crestor.   Chronic anxiety/depression: Not quite alert since Klonopin. -Continue home lorazepam.   Goals of care conversation: Remains full code.  -Palliative medicine recommended out of patient follow-up.   Inadequate oral intake Nutrition Problem: Inadequate oral intake Etiology: lethargy/confusion, poor appetite Signs/Symptoms: meal completion < 50% Interventions: Ensure Enlive (each supplement provides 350kcal and 20 grams of protein)     Time spent 35 minutes  Vital signs Vitals:   10/08/22 1628 10/08/22 2000 10/09/22 0542 10/09/22 0717  BP: (!) 124/108 134/62 (!) 146/71 (!) 153/72  Pulse: 62 60 61 (!) 58  Temp: 97.8 F (36.6 C) 98.2 F (36.8 C) (!) 97.3 F (36.3 C) 97.8 F (36.6 C)  Resp: 18 16 16 18   Height:      Weight:      SpO2: 92% 93% 92% 93%  TempSrc:  Oral Oral Oral  BMI (Calculated):         Discharge exam  GENERAL: Appears frail.  No apparent distress. HEENT: MMM.  Vision and hearing grossly intact.  NECK: Neck collar in place. RESP:  No IWOB.  Fair aeration bilaterally. CVS:  RRR. Heart sounds normal.  ABD/GI/GU: BS+. Abd  soft, NTND.  MSK/EXT:  Moves extremities. No apparent deformity. No edema.  SKIN: no apparent skin lesion or wound NEURO: Awake and alert. Oriented appropriately.  No apparent focal neuro deficit. PSYCH: Calm. Normal affect.   Discharge Instructions Discharge Instructions     Diet - low sodium heart healthy   Complete by: As directed    Increase activity slowly   Complete by: As directed    No wound care   Complete by: As directed       Allergies as of 10/09/2022       Reactions   Penicillins Hives, Rash, Other (See Comments)   Has patient had a PCN reaction causing immediate rash, facial/tongue/throat swelling, SOB or lightheadedness with hypotension: Yes Has patient had a PCN reaction causing severe rash involving mucus membranes or skin necrosis: yes Has patient had a PCN reaction that required hospitalization: No Has patient had a PCN reaction occurring within the last 10 years: no If all of the above answers are "NO", then may proceed with Cephalosporin use. Other Reaction: OTHER REACTION   Valsartan Itching, Other (See Comments)   Atorvastatin Diarrhea   Carvedilol Other (See Comments)   "teeth hurt when I took it"   Ezetimibe Nausea Only   Fluvastatin Sodium Other (See Comments)   "Can't remember"  Magnesium Hydroxide Other (See Comments)   REACTION: triggers HAs   Meloxicam Other (See Comments)   Pt seeing auro's / spots.     Pneumovax [pneumococcal Polysaccharide Vaccine] Other (See Comments)   Local reaction   Quinapril Hcl Other (See Comments)    "feelings of tiredness"   Simvastatin Diarrhea   Topamax [topiramate] Itching   Vit D-vit E-safflower Oil Other (See Comments)   Headaches        Medication List     STOP taking these medications    furosemide 20 MG tablet Commonly known as: LASIX   HYDROcodone-acetaminophen 5-325 MG tablet Commonly known as: NORCO/VICODIN   traMADol 50 MG tablet Commonly known as: ULTRAM       TAKE these  medications    acetaminophen 500 MG tablet Commonly known as: TYLENOL Take 2 tablets (1,000 mg total) by mouth every 8 (eight) hours for 5 days, THEN 2 tablets (1,000 mg total) every 8 (eight) hours as needed for up to 25 days. Start taking on: Oct 09, 2022 What changed:  medication strength See the new instructions.   alendronate 70 MG tablet Commonly known as: FOSAMAX Take 1 tablet (70 mg total) by mouth once a week. Take with a full glass of water on an empty stomach.   amiodarone 200 MG tablet Commonly known as: PACERONE Take 200 mg by mouth daily.   amLODipine 5 MG tablet Commonly known as: NORVASC Take 1 tablet (5 mg total) by mouth daily.   apixaban 2.5 MG Tabs tablet Commonly known as: ELIQUIS Take 1 tablet (2.5 mg total) by mouth 2 (two) times daily. What changed: when to take this   calcium carbonate 500 MG chewable tablet Commonly known as: TUMS - dosed in mg elemental calcium Chew 1 tablet (200 mg of elemental calcium total) by mouth 3 (three) times daily as needed for indigestion or heartburn.   clopidogrel 75 MG tablet Commonly known as: PLAVIX Take 1 tablet (75 mg total) by mouth daily.   docusate sodium 100 MG capsule Commonly known as: COLACE Take 1 capsule (100 mg total) by mouth 2 (two) times daily.   lidocaine 5 % Commonly known as: LIDODERM Place 1 patch onto the skin daily. Remove & Discard patch within 12 hours or as directed by MD Start taking on: Oct 10, 2022   LORazepam 0.5 MG tablet Commonly known as: ATIVAN Take 1 tablet (0.5 mg total) by mouth 2 (two) times daily.   metoprolol tartrate 50 MG tablet Commonly known as: LOPRESSOR Take 1 tablet (50 mg total) by mouth 2 (two) times daily.   nitroGLYCERIN 0.4 MG SL tablet Commonly known as: NITROSTAT Place 1 tablet (0.4 mg total) under the tongue every 5 (five) minutes x 3 doses as needed for chest pain.   ondansetron 4 MG disintegrating tablet Commonly known as: ZOFRAN-ODT Take 4 mg by  mouth every 8 (eight) hours as needed.   oxyCODONE 5 MG immediate release tablet Commonly known as: Oxy IR/ROXICODONE Take 1 tablet (5 mg total) by mouth every 8 (eight) hours as needed for up to 7 days for severe pain or moderate pain.   pantoprazole 40 MG tablet Commonly known as: PROTONIX Take 1 tablet (40 mg total) by mouth at bedtime. What changed: when to take this   polyethylene glycol 17 g packet Commonly known as: MIRALAX / GLYCOLAX Take 17 g by mouth daily.   rosuvastatin 20 MG tablet Commonly known as: CRESTOR Take 1 tablet (20 mg total) by mouth  daily. What changed: when to take this   sertraline 25 MG tablet Commonly known as: ZOLOFT Take 12.5 mg by mouth at bedtime.        Consultations: Neurosurgery Palliative medicine  Procedures/Studies:   [facility] Chest Port 1 View  Result Date: 10/05/2022 CLINICAL DATA:  Hypoxemia. EXAM: PORTABLE CHEST 1 VIEW COMPARISON:  09/28/2022 FINDINGS: Increased densities at both lung bases concerning for bilateral pleural effusions and basilar consolidation/airspace disease. Heart size is grossly stable. Again noted is bone cement involving multiple thoracic vertebral bodies. Prominent interstitial lung markings are similar to the previous examination but difficult to exclude mild edema. Old fracture of the proximal right humerus. IMPRESSION: 1. Increased densities at both lung bases. Findings are concerning for bilateral pleural effusions with basilar atelectasis/consolidation. 2. Prominent interstitial lung markings are similar to the previous examination and cannot exclude mild edema. Electronically Signed   By: Richarda Overlie M.D.   On: 10/05/2022 14:27   ECHOCARDIOGRAM LIMITED  Result Date: 09/30/2022    ECHOCARDIOGRAM LIMITED REPORT   Patient Name:   GENIE MOORHOUSE Date of Exam: 09/30/2022 Medical Rec #:  [id number]         Height:       66.0 in Accession #:    [id number]        Weight:       125.7 lb Date of Birth:  November 03, 1929          BSA:          1.641 m Patient Age:    89 years          BP:           145/65 mmHg Patient Gender: F                 HR:           68 bpm. Exam Location:  Inpatient Procedure: Limited Echo, Cardiac Doppler and Color Doppler Indications:    R01.1 Murmur  History:        Patient has prior history of Echocardiogram examinations, most                 recent 02/24/2022. Cardiomyopathy, CAD, Stroke; Risk                 Factors:Hypertension and Dyslipidemia. SDH. Takotsubo.                 Asymmetric LVH.  Sonographer:    Sheralyn Boatman RDCS Referring Phys: [id number] [facility] M PATEL  Sonographer Comments: Technically difficult study due to poor echo windows. Patient had neck brace in place. Suboptimal parasternal. Patient very sensitive to pressure from probe. IMPRESSIONS  1. No sig LVOT obstruction. Basal/septal hypertrophy can be seen with old age. Unchanged hypokinesis of the inferior/inferolateral/inferoseptal base-mid wall. Left ventricular ejection fraction, by estimation, is 50 to 55%. The left ventricle has low normal function. There is severe asymmetric left ventricular hypertrophy of the basal-septal segment. Left ventricular diastolic parameters are consistent with Grade I diastolic dysfunction (impaired relaxation).  2. No evidence of mitral valve regurgitation.  3. There is moderate calcification of the aortic valve. Aortic valve regurgitation is not visualized. Mild aortic valve stenosis.  4. The inferior vena cava is normal in size with greater than 50% respiratory variability, suggesting right atrial pressure of 3 mmHg. Comparison(s): No significant change from prior study. FINDINGS  Left Ventricle: No sig LVOT obstruction. Basal/septal hypertrophy can be seen with old age. Unchanged hypokinesis of the inferior/inferolateral/inferoseptal base-mid wall.  Left ventricular ejection fraction, by estimation, is 50 to 55%. The left ventricle has low normal function. There is severe asymmetric left ventricular hypertrophy  of the basal-septal segment. Left ventricular diastolic parameters are consistent with Grade I diastolic dysfunction (impaired relaxation). Mitral Valve: Mild mitral annular calcification. Tricuspid Valve: Tricuspid valve regurgitation is trivial. Aortic Valve: There is moderate calcification of the aortic valve. Aortic valve regurgitation is not visualized. Mild aortic stenosis is present. Aortic valve mean gradient measures 12.0 mmHg. Aortic valve peak gradient measures 22.1 mmHg. Aortic valve area, by VTI measures 2.43 cm. Aorta: The aortic root and ascending aorta are structurally normal, with no evidence of dilitation. Venous: The inferior vena cava is normal in size with greater than 50% respiratory variability, suggesting right atrial pressure of 3 mmHg. Additional Comments: Spectral Doppler performed. Color Doppler performed.  LEFT VENTRICLE PLAX 2D LVIDd:         3.20 cm LVIDs:         2.30 cm LV PW:         1.10 cm LV IVS:        1.10 cm LVOT diam:     2.20 cm   3D Volume EF: LV SV:         127       3D EF:        55 % LV SV Index:   77        LV EDV:       89 ml LVOT Area:     3.80 cm  LV ESV:       40 ml                          LV SV:        49 ml IVC IVC diam: 2.20 cm LEFT ATRIUM         Index LA diam:    5.00 cm 3.05 cm/m  AORTIC VALVE AV Area (Vmax):    2.31 cm AV Area (Vmean):   2.35 cm AV Area (VTI):     2.43 cm AV Vmax:           235.00 cm/s AV Vmean:          162.000 cm/s AV VTI:            0.520 m AV Peak Grad:      22.1 mmHg AV Mean Grad:      12.0 mmHg LVOT Vmax:         143.00 cm/s LVOT Vmean:        100.000 cm/s LVOT VTI:          0.333 m LVOT/AV VTI ratio: 0.64  AORTA Ao Root diam: 3.05 cm Ao Asc diam:  2.70 cm MITRAL VALVE MV Area (PHT): 2.69 cm     SHUNTS MV Decel Time: 282 msec     Systemic VTI:  0.33 m MV E velocity: 80.20 cm/s   Systemic Diam: 2.20 cm MV A velocity: 104.00 cm/s MV E/A ratio:  0.77 [place] signed by Carolan Clines Signature Date/Time:  09/30/2022/11:08:46 AM    Final    CT HEAD WO CONTRAST ( )  Result Date: 09/29/2022 CLINICAL DATA:  89 year old female with falls, small right para falcine subdural hematoma. EXAM: CT HEAD WITHOUT CONTRAST TECHNIQUE: Contiguous axial images were obtained from the base of the skull through the vertex without intravenous contrast. RADIATION DOSE REDUCTION: This exam was performed according to the  departmental dose-optimization program which includes automated exposure control, adjustment of the mA and/or kV according to patient size and/or use of iterative reconstruction technique. COMPARISON:  Head CT yesterday, 07/24/2022. FINDINGS: Brain: Trace right para falcine subdural blood on series 4, image 27 is unchanged. No new intracranial hemorrhage identified. No midline shift, mass effect, or evidence of intracranial mass lesion. No IVH or ventriculomegaly. Chronic encephalomalacia in the right inferior frontal gyrus, right inferior temporal gyrus. Chronic lacunar infarct of the right thalamus. Patchy and confluent bilateral cerebral white matter hypodensity. Small chronic left cerebellar infarct. No cortically based acute infarct identified. Vascular: Calcified atherosclerosis at the skull base. Intracranial artery tortuosity. No suspicious intracranial vascular hyperdensity. Skull: Stable.  No acute fracture identified. Sinuses/Orbits: Visualized paranasal sinuses and mastoids are stable and well aerated. Other: No discrete orbit or scalp soft tissue injury. IMPRESSION: 1. Trace right para falcine subdural blood is stable with no intracranial mass effect. 2. No new intracranial abnormality. 3. Chronic ischemic disease, and ischemic versus posttraumatic right frontotemporal encephalomalacia. Electronically Signed   By: Odessa Fleming M.D.   On: 09/29/2022 06:39   CT HEAD WO CONTRAST  Addendum Date: 09/29/2022   ADDENDUM REPORT: 09/29/2022 01:23 ADDENDUM: In retrospect the T7 compression fracture described above,  does appear to have been present on thoracic spine plain films from 02/19/2022. There is increased sclerosis of the vertebral body compatible with an interval healing response, but the degree of compression and retropulsion seems unchanged. Electronically Signed   By: Almira Bar M.D.   On: 09/29/2022 01:23   Result Date: 09/29/2022 CLINICAL DATA:  Fall trauma with head trauma, additional blunt poly frequent falls. Trauma. EXAM: CT HEAD WITHOUT CONTRAST CT CERVICAL SPINE WITHOUT CONTRAST CT CHEST, ABDOMEN AND PELVIS WITHOUT CONTRAST TECHNIQUE: Contiguous axial images were obtained from the base of the skull through the vertex without intravenous contrast. Multidetector CT imaging of the cervical spine was performed without intravenous contrast. Multiplanar CT image reconstructions were also generated. Multidetector CT imaging of the chest, abdomen and pelvis was performed following the standard protocol without IV contrast. RADIATION DOSE REDUCTION: This exam was performed according to the departmental dose-optimization program which includes automated exposure control, adjustment of the mA and/or kV according to patient size and/or use of iterative reconstruction technique. COMPARISON:  Head and cervical spine CT both obtained recently 09/24/2022, CTA chest, abdomen and pelvis 10/01/2021, abdomen and pelvis CT 10/03/2020. FINDINGS: CT HEAD FINDINGS Brain: Newly noted is a small acute 3 mm thick right parafalcine subdural bleed in the high frontal area. There is no further evidence of intracranial hemorrhage. There is cerebral atrophy with mild atrophic ventriculomegaly, chronic inferior right frontal infarct, and extensive and fairly confluent hypoattenuating small-vessel disease of the cerebral white matter. There is a chronic right thalamic lacunar infarct and a small chronic infarct anteriorly in the left cerebellar hemisphere. No new cortical based infarct is seen, no mass effect or midline shift. Basal  cisterns are clear. Vascular: There is calcification distal right vertebral artery and both siphons. No hyperdense central vessel is seen. Skull: There is osteopenia without evidence for depressed fractures. No scalp hematoma is seen. Sinuses/Orbits: Negative orbits. Clear paranasal sinuses with mild septal deviation to the right. Fluid again noted the bilateral mastoid tips, left-greater-than-right, with otherwise clear mastoids. Other: None. CT CERVICAL FINDINGS Alignment: Unchanged. No traumatic listhesis is suspected. A slight degenerative listhesis is again noted at C4-5 and C7-T1. Bone-on-bone anterior atlantodental joint space loss and osteophytosis is again shown. There is no  C1-2 offset. Skull base and vertebrae: Generalized osteopenia. There is a chronic mild upper plate anterior wedge compression fracture of the T1 vertebral body. This appears unchanged. At C7, new from 09/24/2022 there is a slight loss of vertebral height of about 15% and mild anterior bulge newly seen in the contour of the anterior vertebral body, findings consistent with a new mild compression fracture. At T3, new since 10/01/2021 there is a compression fracture with up to 30-40% generalized vertebral height loss and mild fragmentation, slight outward spreading of fragments and retropulsion, without cord compromise. Kyphoplasty cement and partial vertebral height loss are unchanged at T4. Soft tissues and spinal canal: There is edema and swelling around the new T3 compression fracture but no notable swelling at C7. There is no definitive spinal canal hematoma. Soft tissue resolution in the thoracic spine is less than in the cervical spine due to patient's overlying arms. There is moderate calcification in both proximal cervical ICAs. No laryngeal mass or thyroid nodules. Disc levels: Preservation of disc heights except for mild disc space loss at C5-6. There are mild facet joint and uncinate spurring changes at most levels, greater on  the left, moderate left foraminal stenosis again noted C3-4, C4-5 and C6-7. No other significant foraminal stenosis. Other: None. CT CHEST FINDINGS Cardiovascular: Mild cardiomegaly. There are three-vessel coronary artery calcifications, aortic tortuosity, and moderate calcific plaques of the aorta and great vessels without aneurysm. Pulmonary arteries and veins appear normal in caliber. Mediastinum/Nodes: Calcified mediastinal and right hilar nodes. No adenopathy is seen without contrast. Moderate hiatal hernia is unchanged. Lungs/Pleura: Small layering pleural effusions new from prior study. No pneumothorax. The main bronchi are clear. There are scarring changes in the apices and bases increased atelectatic change in the left lower lobe versus consolidation. There is diffuse bronchial thickening. No nodules or further new findings. Musculoskeletal: Generalized osteopenia. There is a chronic fracture of the proximal right humerus. There are multiple bilateral chronic healed rib fracture deformities without acute displaced rib fracture. Rudimentary C7 cervical ribs.  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 Kyphoplasty cement is again noted in the T4 T8, T9, T10 and T12 vertebrae. In addition to the new compression fracture described above at T3 there is also a new moderate to severe compression fracture of the T7 vertebral body, with 75-80% loss of the vertebral height in general, with about 4 mm of posteroinferior cortical retropulsion. This mildly narrows the thecal sac eccentric to the left to 6.3 mm AP. Unchanged treated moderate wedging T8, T9, T10 and T12. No definitive evidence of a canal hematoma but there is increased soft tissue fullness alongside T3 and T7. CT ABDOMEN AND PELVIS FINDINGS Hepatobiliary: Increasingly dense liver measuring 20.6 cm length. Clinical correlation advised for amiodarone or heavy metal toxicity versus iron deposition. There are scattered calcified granulomas without visible mass.  Gallbladder is absent with no biliary dilatation. Pancreas: No abnormality. Spleen: Costophrenic granulomas. Otherwise no further abnormality. No adjacent hematoma. Adrenals/Urinary Tract: No significant findings. No calculus. No bladder thickening or contour deforming mass of the kidneys. Stomach/Bowel: No dilatation or wall thickening. Moderate fecal stasis. Diverticulosis without diverticulitis. Vascular/Lymphatic: Heavy aortoiliac calcific plaques with visceral branch vessel atherosclerosis. No adenopathy is seen. Reproductive: Small calcified uterine fibroid. No other significant findings. Other: Mild generalized body wall edema similar. Trace presacral ascites is seen and mild mesenteric congestive features, also similar. There is no free hemorrhage or free air. Musculoskeletal: Since 10/01/2021, interval right hip nailing. There are healed pubic rami fractures. Osteopenia.  Four lumbar type vertebrae with chronic wedging and kyphoplasty L1. Mild biconcave appearance of L2 and mild upper plate concavity at L3. Normal height of L5. All unchanged. No regional acute skeletal fracture is seen. IMPRESSION: 1. 3 mm acute small right parafalcine subdural bleed in the high frontal area. No mass effect or midline shift. 2. Osteopenia and degenerative change of the cervical spine without traumatic listhesis. 3. New T3 compression fracture with 30-40% loss of the vertebral height and mild fragmentation, with slight outward spreading of fragments and mild retropulsion. No cord compromise but there is mild narrowing of the thecal sac. 4. New C7 mild compression fracture with loss of vertebral height of about 15% and mild anterior bulge of the anterior vertebral body. 5. No definitive canal hematoma but there is increased soft tissue fullness alongside T3 and T7. MRI follow-up recommended. Multilevel old kyphoplasty. 6. Small pleural effusions with increased atelectatic change versus consolidation in the left lower lobe.  Diffuse bronchitis. 7. Cardiomegaly with calcific CAD, aortic and branch vessel atherosclerosis. 8. Increasingly dense liver. Clinical correlation advised for amiodarone or heavy metal toxicity versus iron deposition. 9. No acute trauma related findings in the abdomen or pelvis. 10. Chronically noted mesenteric congestion and mild body wall edema. Trace ascites. 11. Constipation and diverticulosis. 12. Interval right hip nailing since last year. 13. Critical Value/emergent results were called by telephone at the time of interpretation on 09/28/2022 at 11:49 pm to provider Dr. Madilyn Hook, who verbally acknowledged these results. Aortic Atherosclerosis (ICD10-I70.0). Electronically Signed: By: Almira Bar M.D. On: 09/29/2022 00:36   CT CERVICAL SPINE WO CONTRAST  Addendum Date: 09/29/2022   ADDENDUM REPORT: 09/29/2022 01:23 ADDENDUM: In retrospect the T7 compression fracture described above, does appear to have been present on thoracic spine plain films from 02/19/2022. There is increased sclerosis of the vertebral body compatible with an interval healing response, but the degree of compression and retropulsion seems unchanged. Electronically Signed   By: Almira Bar M.D.   On: 09/29/2022 01:23   Result Date: 09/29/2022 CLINICAL DATA:  Fall trauma with head trauma, additional blunt poly frequent falls. Trauma. EXAM: CT HEAD WITHOUT CONTRAST CT CERVICAL SPINE WITHOUT CONTRAST CT CHEST, ABDOMEN AND PELVIS WITHOUT CONTRAST TECHNIQUE: Contiguous axial images were obtained from the base of the skull through the vertex without intravenous contrast. Multidetector CT imaging of the cervical spine was performed without intravenous contrast. Multiplanar CT image reconstructions were also generated. Multidetector CT imaging of the chest, abdomen and pelvis was performed following the standard protocol without IV contrast. RADIATION DOSE REDUCTION: This exam was performed according to the departmental dose-optimization  program which includes automated exposure control, adjustment of the mA and/or kV according to patient size and/or use of iterative reconstruction technique. COMPARISON:  Head and cervical spine CT both obtained recently 09/24/2022, CTA chest, abdomen and pelvis 10/01/2021, abdomen and pelvis CT 10/03/2020. FINDINGS: CT HEAD FINDINGS Brain: Newly noted is a small acute 3 mm thick right parafalcine subdural bleed in the high frontal area. There is no further evidence of intracranial hemorrhage. There is cerebral atrophy with mild atrophic ventriculomegaly, chronic inferior right frontal infarct, and extensive and fairly confluent hypoattenuating small-vessel disease of the cerebral white matter. There is a chronic right thalamic lacunar infarct and a small chronic infarct anteriorly in the left cerebellar hemisphere. No new cortical based infarct is seen, no mass effect or midline shift. Basal cisterns are clear. Vascular: There is calcification distal right vertebral artery and both siphons. No hyperdense central vessel  is seen. Skull: There is osteopenia without evidence for depressed fractures. No scalp hematoma is seen. Sinuses/Orbits: Negative orbits. Clear paranasal sinuses with mild septal deviation to the right. Fluid again noted the bilateral mastoid tips, left-greater-than-right, with otherwise clear mastoids. Other: None. CT CERVICAL FINDINGS Alignment: Unchanged. No traumatic listhesis is suspected. A slight degenerative listhesis is again noted at C4-5 and C7-T1. Bone-on-bone anterior atlantodental joint space loss and osteophytosis is again shown. There is no C1-2 offset. Skull base and vertebrae: Generalized osteopenia. There is a chronic mild upper plate anterior wedge compression fracture of the T1 vertebral body. This appears unchanged. At C7, new from 09/24/2022 there is a slight loss of vertebral height of about 15% and mild anterior bulge newly seen in the contour of the anterior vertebral body,  findings consistent with a new mild compression fracture. At T3, new since 10/01/2021 there is a compression fracture with up to 30-40% generalized vertebral height loss and mild fragmentation, slight outward spreading of fragments and retropulsion, without cord compromise. Kyphoplasty cement and partial vertebral height loss are unchanged at T4. Soft tissues and spinal canal: There is edema and swelling around the new T3 compression fracture but no notable swelling at C7. There is no definitive spinal canal hematoma. Soft tissue resolution in the thoracic spine is less than in the cervical spine due to patient's overlying arms. There is moderate calcification in both proximal cervical ICAs. No laryngeal mass or thyroid nodules. Disc levels: Preservation of disc heights except for mild disc space loss at C5-6. There are mild facet joint and uncinate spurring changes at most levels, greater on the left, moderate left foraminal stenosis again noted C3-4, C4-5 and C6-7. No other significant foraminal stenosis. Other: None. CT CHEST FINDINGS Cardiovascular: Mild cardiomegaly. There are three-vessel coronary artery calcifications, aortic tortuosity, and moderate calcific plaques of the aorta and great vessels without aneurysm. Pulmonary arteries and veins appear normal in caliber. Mediastinum/Nodes: Calcified mediastinal and right hilar nodes. No adenopathy is seen without contrast. Moderate hiatal hernia is unchanged. Lungs/Pleura: Small layering pleural effusions new from prior study. No pneumothorax. The main bronchi are clear. There are scarring changes in the apices and bases increased atelectatic change in the left lower lobe versus consolidation. There is diffuse bronchial thickening. No nodules or further new findings. Musculoskeletal: Generalized osteopenia. There is a chronic fracture of the proximal right humerus. There are multiple bilateral chronic healed rib fracture deformities without acute displaced rib  fracture. Rudimentary C7 cervical ribs.  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 Kyphoplasty cement is again noted in the T4 T8, T9, T10 and T12 vertebrae. In addition to the new compression fracture described above at T3 there is also a new moderate to severe compression fracture of the T7 vertebral body, with 75-80% loss of the vertebral height in general, with about 4 mm of posteroinferior cortical retropulsion. This mildly narrows the thecal sac eccentric to the left to 6.3 mm AP. Unchanged treated moderate wedging T8, T9, T10 and T12. No definitive evidence of a canal hematoma but there is increased soft tissue fullness alongside T3 and T7. CT ABDOMEN AND PELVIS FINDINGS Hepatobiliary: Increasingly dense liver measuring 20.6 cm length. Clinical correlation advised for amiodarone or heavy metal toxicity versus iron deposition. There are scattered calcified granulomas without visible mass. Gallbladder is absent with no biliary dilatation. Pancreas: No abnormality. Spleen: Costophrenic granulomas. Otherwise no further abnormality. No adjacent hematoma. Adrenals/Urinary Tract: No significant findings. No calculus. No bladder thickening or contour deforming mass of  the kidneys. Stomach/Bowel: No dilatation or wall thickening. Moderate fecal stasis. Diverticulosis without diverticulitis. Vascular/Lymphatic: Heavy aortoiliac calcific plaques with visceral branch vessel atherosclerosis. No adenopathy is seen. Reproductive: Small calcified uterine fibroid. No other significant findings. Other: Mild generalized body wall edema similar. Trace presacral ascites is seen and mild mesenteric congestive features, also similar. There is no free hemorrhage or free air. Musculoskeletal: Since 10/01/2021, interval right hip nailing. There are healed pubic rami fractures. Osteopenia. Four lumbar type vertebrae with chronic wedging and kyphoplasty L1. Mild biconcave appearance of L2 and mild upper plate concavity at L3. Normal height  of L5. All unchanged. No regional acute skeletal fracture is seen. IMPRESSION: 1. 3 mm acute small right parafalcine subdural bleed in the high frontal area. No mass effect or midline shift. 2. Osteopenia and degenerative change of the cervical spine without traumatic listhesis. 3. New T3 compression fracture with 30-40% loss of the vertebral height and mild fragmentation, with slight outward spreading of fragments and mild retropulsion. No cord compromise but there is mild narrowing of the thecal sac. 4. New C7 mild compression fracture with loss of vertebral height of about 15% and mild anterior bulge of the anterior vertebral body. 5. No definitive canal hematoma but there is increased soft tissue fullness alongside T3 and T7. MRI follow-up recommended. Multilevel old kyphoplasty. 6. Small pleural effusions with increased atelectatic change versus consolidation in the left lower lobe. Diffuse bronchitis. 7. Cardiomegaly with calcific CAD, aortic and branch vessel atherosclerosis. 8. Increasingly dense liver. Clinical correlation advised for amiodarone or heavy metal toxicity versus iron deposition. 9. No acute trauma related findings in the abdomen or pelvis. 10. Chronically noted mesenteric congestion and mild body wall edema. Trace ascites. 11. Constipation and diverticulosis. 12. Interval right hip nailing since last year. 13. Critical Value/emergent results were called by telephone at the time of interpretation on 09/28/2022 at 11:49 pm to provider Dr. Madilyn Hook, who verbally acknowledged these results. Aortic Atherosclerosis (ICD10-I70.0). Electronically Signed: By: Almira Bar M.D. On: 09/29/2022 00:36   CT CHEST ABDOMEN PELVIS WO CONTRAST  Addendum Date: 09/29/2022   ADDENDUM REPORT: 09/29/2022 01:23 ADDENDUM: In retrospect the T7 compression fracture described above, does appear to have been present on thoracic spine plain films from 02/19/2022. There is increased sclerosis of the vertebral body  compatible with an interval healing response, but the degree of compression and retropulsion seems unchanged. Electronically Signed   By: Almira Bar M.D.   On: 09/29/2022 01:23   Result Date: 09/29/2022 CLINICAL DATA:  Fall trauma with head trauma, additional blunt poly frequent falls. Trauma. EXAM: CT HEAD WITHOUT CONTRAST CT CERVICAL SPINE WITHOUT CONTRAST CT CHEST, ABDOMEN AND PELVIS WITHOUT CONTRAST TECHNIQUE: Contiguous axial images were obtained from the base of the skull through the vertex without intravenous contrast. Multidetector CT imaging of the cervical spine was performed without intravenous contrast. Multiplanar CT image reconstructions were also generated. Multidetector CT imaging of the chest, abdomen and pelvis was performed following the standard protocol without IV contrast. RADIATION DOSE REDUCTION: This exam was performed according to the departmental dose-optimization program which includes automated exposure control, adjustment of the mA and/or kV according to patient size and/or use of iterative reconstruction technique. COMPARISON:  Head and cervical spine CT both obtained recently 09/24/2022, CTA chest, abdomen and pelvis 10/01/2021, abdomen and pelvis CT 10/03/2020. FINDINGS: CT HEAD FINDINGS Brain: Newly noted is a small acute 3 mm thick right parafalcine subdural bleed in the high frontal area. There is no further evidence of  intracranial hemorrhage. There is cerebral atrophy with mild atrophic ventriculomegaly, chronic inferior right frontal infarct, and extensive and fairly confluent hypoattenuating small-vessel disease of the cerebral white matter. There is a chronic right thalamic lacunar infarct and a small chronic infarct anteriorly in the left cerebellar hemisphere. No new cortical based infarct is seen, no mass effect or midline shift. Basal cisterns are clear. Vascular: There is calcification distal right vertebral artery and both siphons. No hyperdense central vessel is  seen. Skull: There is osteopenia without evidence for depressed fractures. No scalp hematoma is seen. Sinuses/Orbits: Negative orbits. Clear paranasal sinuses with mild septal deviation to the right. Fluid again noted the bilateral mastoid tips, left-greater-than-right, with otherwise clear mastoids. Other: None. CT CERVICAL FINDINGS Alignment: Unchanged. No traumatic listhesis is suspected. A slight degenerative listhesis is again noted at C4-5 and C7-T1. Bone-on-bone anterior atlantodental joint space loss and osteophytosis is again shown. There is no C1-2 offset. Skull base and vertebrae: Generalized osteopenia. There is a chronic mild upper plate anterior wedge compression fracture of the T1 vertebral body. This appears unchanged. At C7, new from 09/24/2022 there is a slight loss of vertebral height of about 15% and mild anterior bulge newly seen in the contour of the anterior vertebral body, findings consistent with a new mild compression fracture. At T3, new since 10/01/2021 there is a compression fracture with up to 30-40% generalized vertebral height loss and mild fragmentation, slight outward spreading of fragments and retropulsion, without cord compromise. Kyphoplasty cement and partial vertebral height loss are unchanged at T4. Soft tissues and spinal canal: There is edema and swelling around the new T3 compression fracture but no notable swelling at C7. There is no definitive spinal canal hematoma. Soft tissue resolution in the thoracic spine is less than in the cervical spine due to patient's overlying arms. There is moderate calcification in both proximal cervical ICAs. No laryngeal mass or thyroid nodules. Disc levels: Preservation of disc heights except for mild disc space loss at C5-6. There are mild facet joint and uncinate spurring changes at most levels, greater on the left, moderate left foraminal stenosis again noted C3-4, C4-5 and C6-7. No other significant foraminal stenosis. Other: None. CT  CHEST FINDINGS Cardiovascular: Mild cardiomegaly. There are three-vessel coronary artery calcifications, aortic tortuosity, and moderate calcific plaques of the aorta and great vessels without aneurysm. Pulmonary arteries and veins appear normal in caliber. Mediastinum/Nodes: Calcified mediastinal and right hilar nodes. No adenopathy is seen without contrast. Moderate hiatal hernia is unchanged. Lungs/Pleura: Small layering pleural effusions new from prior study. No pneumothorax. The main bronchi are clear. There are scarring changes in the apices and bases increased atelectatic change in the left lower lobe versus consolidation. There is diffuse bronchial thickening. No nodules or further new findings. Musculoskeletal: Generalized osteopenia. There is a chronic fracture of the proximal right humerus. There are multiple bilateral chronic healed rib fracture deformities without acute displaced rib fracture. Rudimentary C7 cervical ribs.  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 Kyphoplasty cement is again noted in the T4 T8, T9, T10 and T12 vertebrae. In addition to the new compression fracture described above at T3 there is also a new moderate to severe compression fracture of the T7 vertebral body, with 75-80% loss of the vertebral height in general, with about 4 mm of posteroinferior cortical retropulsion. This mildly narrows the thecal sac eccentric to the left to 6.3 mm AP. Unchanged treated moderate wedging T8, T9, T10 and T12. No definitive evidence of a canal hematoma  but there is increased soft tissue fullness alongside T3 and T7. CT ABDOMEN AND PELVIS FINDINGS Hepatobiliary: Increasingly dense liver measuring 20.6 cm length. Clinical correlation advised for amiodarone or heavy metal toxicity versus iron deposition. There are scattered calcified granulomas without visible mass. Gallbladder is absent with no biliary dilatation. Pancreas: No abnormality. Spleen: Costophrenic granulomas. Otherwise no further  abnormality. No adjacent hematoma. Adrenals/Urinary Tract: No significant findings. No calculus. No bladder thickening or contour deforming mass of the kidneys. Stomach/Bowel: No dilatation or wall thickening. Moderate fecal stasis. Diverticulosis without diverticulitis. Vascular/Lymphatic: Heavy aortoiliac calcific plaques with visceral branch vessel atherosclerosis. No adenopathy is seen. Reproductive: Small calcified uterine fibroid. No other significant findings. Other: Mild generalized body wall edema similar. Trace presacral ascites is seen and mild mesenteric congestive features, also similar. There is no free hemorrhage or free air. Musculoskeletal: Since 10/01/2021, interval right hip nailing. There are healed pubic rami fractures. Osteopenia. Four lumbar type vertebrae with chronic wedging and kyphoplasty L1. Mild biconcave appearance of L2 and mild upper plate concavity at L3. Normal height of L5. All unchanged. No regional acute skeletal fracture is seen. IMPRESSION: 1. 3 mm acute small right parafalcine subdural bleed in the high frontal area. No mass effect or midline shift. 2. Osteopenia and degenerative change of the cervical spine without traumatic listhesis. 3. New T3 compression fracture with 30-40% loss of the vertebral height and mild fragmentation, with slight outward spreading of fragments and mild retropulsion. No cord compromise but there is mild narrowing of the thecal sac. 4. New C7 mild compression fracture with loss of vertebral height of about 15% and mild anterior bulge of the anterior vertebral body. 5. No definitive canal hematoma but there is increased soft tissue fullness alongside T3 and T7. MRI follow-up recommended. Multilevel old kyphoplasty. 6. Small pleural effusions with increased atelectatic change versus consolidation in the left lower lobe. Diffuse bronchitis. 7. Cardiomegaly with calcific CAD, aortic and branch vessel atherosclerosis. 8. Increasingly dense liver.  Clinical correlation advised for amiodarone or heavy metal toxicity versus iron deposition. 9. No acute trauma related findings in the abdomen or pelvis. 10. Chronically noted mesenteric congestion and mild body wall edema. Trace ascites. 11. Constipation and diverticulosis. 12. Interval right hip nailing since last year. 13. Critical Value/emergent results were called by telephone at the time of interpretation on 09/28/2022 at 11:49 pm to provider Dr. Madilyn Hook, who verbally acknowledged these results. Aortic Atherosclerosis (ICD10-I70.0). Electronically Signed: By: Almira Bar M.D. On: 09/29/2022 00:36   CT L-SPINE NO CHARGE  Result Date: 09/29/2022 CLINICAL DATA:  Back trauma.  Level 2 fall on blood thinners. EXAM: CT THORACIC AND LUMBAR SPINE WITHOUT CONTRAST TECHNIQUE: Multidetector CT imaging of the thoracic and lumbar spine was performed without contrast. Multiplanar CT image reconstructions were also generated. RADIATION DOSE REDUCTION: This exam was performed according to the departmental dose-optimization program which includes automated exposure control, adjustment of the mA and/or kV according to patient size and/or use of iterative reconstruction technique. COMPARISON:  Thoracic spine radiographs 02/19/2022 and CT thoracic and lumbar spine 10/01/2021 FINDINGS: CT THORACIC SPINE FINDINGS Alignment: No evidence of traumatic malalignment. Accentuated thoracic kyphosis centered at T7-T8. Vertebrae: Status post vertebroplasty at T4, T8, T9, and T12. There is new superior endplate compression of T3 when compared with 02/19/2022 with mildly displaced right anterior superior corner fracture (series 3/image 57). Unchanged superior endplate compression of T1 where it is mild, T7 where it is advanced with unchanged retropulsion of the posterior endplate. Extrusion vertebroplasty material through  the superior endplate of T12 with indentation of the inferior endplate of T11, unchanged from prior radiograph.  Paraspinal and other soft tissues: Unremarkable. See separate report for findings in the chest, abdomen, and pelvis. Disc levels: Multilevel spondylosis, disc space height loss, and degenerative endplate changes. Osteopenia. No severe spinal canal narrowing. Retropulsion of the posterior vertebral body walls at T3, T7, T8, T9, T10, and T12 cause up to mild effacement of the ventral thecal sac. CT LUMBAR SPINE FINDINGS Segmentation: Transitional anatomy. Absent ribs at T12. Fully sacralized L5. Careful attention to numbering is recommended prior to any intervention. Alignment: No evidence of traumatic malalignment. Vertebrae: L1 vertebroplasty. Superior endplate compression fractures are unchanged in L2 and L3. No evidence of acute fracture. Paraspinal and other soft tissues: Negative. See separate report for findings in the abdomen and pelvis. Disc levels: Multilevel spondylosis, disc space height loss, degenerative endplate changes with vacuum phenomenon. Multilevel moderate-advanced facet arthropathy. Inferior endplate retropulsion at L1 causes mild effacement of the ventral thecal sac. No high-grade spinal canal. IMPRESSION: CT THORACIC SPINE IMPRESSION. 1. Age indeterminate superior endplate compression fracture of T3 with 40% vertebral body height loss and mildly displaced right anterior superior corner fracture. This is new since thoracic spine radiographs 02/19/2022 and may be acute. CT LUMBAR SPINE IMPRESSION. 1. No acute fracture in the lumbar spine. Unchanged superior endplate compression fractures at L2 and L3. 2. Transitional anatomy with complete sacralized L5. Careful attention to numbering is recommended prior to any intervention. Same day CT chest, abdomen, pelvis reported separately. Electronically Signed   By: Minerva Fester M.D.   On: 09/29/2022 00:05   CT T-SPINE NO CHARGE  Result Date: 09/29/2022 CLINICAL DATA:  Back trauma.  Level 2 fall on blood thinners. EXAM: CT THORACIC AND LUMBAR  SPINE WITHOUT CONTRAST TECHNIQUE: Multidetector CT imaging of the thoracic and lumbar spine was performed without contrast. Multiplanar CT image reconstructions were also generated. RADIATION DOSE REDUCTION: This exam was performed according to the departmental dose-optimization program which includes automated exposure control, adjustment of the mA and/or kV according to patient size and/or use of iterative reconstruction technique. COMPARISON:  Thoracic spine radiographs 02/19/2022 and CT thoracic and lumbar spine 10/01/2021 FINDINGS: CT THORACIC SPINE FINDINGS Alignment: No evidence of traumatic malalignment. Accentuated thoracic kyphosis centered at T7-T8. Vertebrae: Status post vertebroplasty at T4, T8, T9, and T12. There is new superior endplate compression of T3 when compared with 02/19/2022 with mildly displaced right anterior superior corner fracture (series 3/image 57). Unchanged superior endplate compression of T1 where it is mild, T7 where it is advanced with unchanged retropulsion of the posterior endplate. Extrusion vertebroplasty material through the superior endplate of T12 with indentation of the inferior endplate of T11, unchanged from prior radiograph. Paraspinal and other soft tissues: Unremarkable. See separate report for findings in the chest, abdomen, and pelvis. Disc levels: Multilevel spondylosis, disc space height loss, and degenerative endplate changes. Osteopenia. No severe spinal canal narrowing. Retropulsion of the posterior vertebral body walls at T3, T7, T8, T9, T10, and T12 cause up to mild effacement of the ventral thecal sac. CT LUMBAR SPINE FINDINGS Segmentation: Transitional anatomy. Absent ribs at T12. Fully sacralized L5. Careful attention to numbering is recommended prior to any intervention. Alignment: No evidence of traumatic malalignment. Vertebrae: L1 vertebroplasty. Superior endplate compression fractures are unchanged in L2 and L3. No evidence of acute fracture.  Paraspinal and other soft tissues: Negative. See separate report for findings in the abdomen and pelvis. Disc levels: Multilevel spondylosis, disc space  height loss, degenerative endplate changes with vacuum phenomenon. Multilevel moderate-advanced facet arthropathy. Inferior endplate retropulsion at L1 causes mild effacement of the ventral thecal sac. No high-grade spinal canal. IMPRESSION: CT THORACIC SPINE IMPRESSION. 1. Age indeterminate superior endplate compression fracture of T3 with 40% vertebral body height loss and mildly displaced right anterior superior corner fracture. This is new since thoracic spine radiographs 02/19/2022 and may be acute. CT LUMBAR SPINE IMPRESSION. 1. No acute fracture in the lumbar spine. Unchanged superior endplate compression fractures at L2 and L3. 2. Transitional anatomy with complete sacralized L5. Careful attention to numbering is recommended prior to any intervention. Same day CT chest, abdomen, pelvis reported separately. Electronically Signed   By: Minerva Fester M.D.   On: 09/29/2022 00:05   DG Pelvis Portable  Result Date: 09/28/2022 CLINICAL DATA:  Trauma fall EXAM: PORTABLE PELVIS 1-2 VIEWS COMPARISON:  05/02/2021, 07/23/2022 FINDINGS: SI joints are non widened. Pubic symphysis appears intact. Old appearing left inferior pubic ramus fracture. Remote left femoral orthopedic hardware. Interval intramedullary rodding of comminuted right intertrochanteric fracture. No definite acute osseous abnormality. IMPRESSION: 1. No acute osseous abnormality. 2. Interval intramedullary rodding of comminuted right intertrochanteric fracture. Electronically Signed   By: Jasmine Pang M.D.   On: 09/28/2022 23:19   DG Chest Port 1 View  Result Date: 09/28/2022 CLINICAL DATA:  Trauma fall EXAM: PORTABLE CHEST 1 VIEW COMPARISON:  07/23/2022 FINDINGS: Mild diffuse interstitial opacity likely chronic disease. Negative for pneumothorax. Healing right proximal humerus fracture with  callus. Multiple treated vertebral compression deformities. Multiple remote right rib fractures. Hiatal hernia contributing to retrocardiac opacity. IMPRESSION: 1. Negative for pneumothorax or pleural effusion. 2. Healing right proximal humerus fracture with callus. 3. Hiatal hernia. Electronically Signed   By: Jasmine Pang M.D.   On: 09/28/2022 23:18       The results of significant diagnostics from this hospitalization (including imaging, microbiology, ancillary and laboratory) are listed below for reference.     Microbiology: Recent Results (from the past 240 hour(s))  Surgical pcr screen     Status: None   Collection Time: 10/04/22  7:29 AM   Specimen: Nasal Mucosa; Nasal Swab  Result Value Ref Range Status   MRSA, PCR NEGATIVE NEGATIVE Final   Staphylococcus aureus NEGATIVE NEGATIVE Final    Comment: (NOTE) The Xpert SA Assay (FDA approved for NASAL specimens in patients 6 years of age and older), is one component of a comprehensive surveillance program. It is not intended to diagnose infection nor to guide or monitor treatment. Performed at [facility], [address] [place], [place] [address]      Labs:  CBC: Recent Labs  Lab 10/05/22 [phone] 10/07/22 0628 10/08/22 0456 10/09/22 0333  WBC 8.9 7.3 7.1 8.1  HGB 13.5 13.9 13.5 12.8  HCT 44.1 45.4 44.5 41.1  MCV 80.2 80.2 81.4 80.3  PLT 505* 484* 483* 466*   BMP &GFR Recent Labs  Lab 10/05/22 0906 10/07/22 0628 10/08/22 0456  NA 131* 130* 131*  K 3.9 4.1 3.7  CL 93* 95* 95*  CO2 30 26 28   GLUCOSE 110* 89 112*  BUN 9 8 8   CREATININE 0.55 0.65 0.63  CALCIUM 8.5* 8.4* 8.2*  MG 2.0 2.0 1.9  PHOS 2.8 3.2 3.3   Estimated Creatinine Clearance: 41.9 mL/min (by C-G formula based on SCr of 0.63 mg/dL). Liver & Pancreas: Recent Labs  Lab 10/05/22 0906 10/07/22 0628 10/08/22 0456  ALBUMIN 2.9* 2.8* 2.7*   No results for input(s): "  LIPASE", "AMYLASE" in the last 168 hours. No results for input(s):  "AMMONIA" in the last 168 hours. Diabetic: No results for input(s): "HGBA1C" in the last 72 hours. Recent Labs  Lab 10/06/22 2045  GLUCAP 117*   Cardiac Enzymes: No results for input(s): "CKTOTAL", "CKMB", "CKMBINDEX", "TROPONINI" in the last 168 hours. No results for input(s): "PROBNP" in the last 8760 hours. Coagulation Profile: No results for input(s): "INR", "PROTIME" in the last 168 hours. Thyroid Function Tests: No results for input(s): "TSH", "T4TOTAL", "FREET4", "T3FREE", "THYROIDAB" in the last 72 hours. Lipid Profile: No results for input(s): "CHOL", "HDL", "LDLCALC", "TRIG", "CHOLHDL", "LDLDIRECT" in the last 72 hours. Anemia Panel: No results for input(s): "VITAMINB12", "FOLATE", "FERRITIN", "TIBC", "IRON", "RETICCTPCT" in the last 72 hours. Urine analysis:    Component Value Date/Time   COLORURINE STRAW (A) 02/27/2022 1639   APPEARANCEUR CLEAR 02/27/2022 1639   LABSPEC 1.004 (L) 02/27/2022 1639   LABSPEC 1.010 05/27/2013 1433   PHURINE 7.0 02/27/2022 1639   GLUCOSEU NEGATIVE 02/27/2022 1639   GLUCOSEU NEGATIVE 09/02/2017 1347   HGBUR NEGATIVE 02/27/2022 1639   HGBUR negative 10/06/2009 1015   BILIRUBINUR NEGATIVE 02/27/2022 1639   BILIRUBINUR Negative 01/21/2018 1618   KETONESUR NEGATIVE 02/27/2022 1639   PROTEINUR NEGATIVE 02/27/2022 1639   UROBILINOGEN negative (A) 01/21/2018 1618   UROBILINOGEN 0.2 09/02/2017 1347   UROBILINOGEN 0.2 05/27/2013 1433   NITRITE NEGATIVE 02/27/2022 1639   LEUKOCYTESUR NEGATIVE 02/27/2022 1639   Sepsis Labs: Invalid input(s): "PROCALCITONIN", "LACTICIDVEN"   SIGNED:  Almon Hercules, MD  [facility] 10/09/2022, 10:51 AM

## 2022-10-09 NOTE — Plan of Care (Signed)

## 2022-10-09 NOTE — Discharge Instructions (Signed)

## 2022-10-09 NOTE — TOC Progression Note (Addendum)
Transition of Care [facility]) - Progression Note    Patient Details  Name: OZETTA FRARY MRN: [id number] Date of Birth: 05-29-1929  Transition [facility] CM/SW Contact  Jalayah Gutridge A [place], [place] Phone Number: 10/09/2022, 10:14 AM  Clinical Narrative:     Auth approved:  Reference ID: [id number]  CSW contacted pt's daughter, [name] regarding potential discharge.  CSW confirmed that discharge to SNF with [facility].   CSW starting insurance authorization, status pending.   [facility] able to take pt today pending insurance authorization.   DC pending insurance.   TOC will continue to follow.     Expected Discharge Plan: Skilled Nursing Facility Barriers to Discharge: Continued Medical Work up, SNF Pending bed offer  Expected Discharge Plan and Services In-house Referral: Clinical Social Work     Living arrangements for the past 2 months: Assisted Living Facility                                       Social Determinants of Health (SDOH) Interventions SDOH Screenings   Food Insecurity: No Food Insecurity (07/24/2022)  Housing: Low Risk  (07/24/2022)  Transportation Needs: No Transportation Needs (07/24/2022)  Utilities: Not At Risk (07/24/2022)  Depression (PHQ2-9): Low Risk  (12/19/2021)  Financial Resource Strain: Low Risk  (08/10/2021)  Tobacco Use: High Risk (10/05/2022)    Readmission Risk Interventions     No data to display

## 2022-10-10 DIAGNOSIS — S22030S Wedge compression fracture of third thoracic vertebra, sequela: Secondary | ICD-10-CM | POA: Diagnosis not present

## 2022-10-10 DIAGNOSIS — S065X0D Traumatic subdural hemorrhage without loss of consciousness, subsequent encounter: Secondary | ICD-10-CM | POA: Diagnosis not present

## 2022-10-10 DIAGNOSIS — I2111 ST elevation (STEMI) myocardial infarction involving right coronary artery: Secondary | ICD-10-CM | POA: Diagnosis not present

## 2022-10-10 DIAGNOSIS — M40204 Unspecified kyphosis, thoracic region: Secondary | ICD-10-CM | POA: Diagnosis not present

## 2022-10-10 DIAGNOSIS — I480 Paroxysmal atrial fibrillation: Secondary | ICD-10-CM | POA: Diagnosis not present

## 2022-10-11 DIAGNOSIS — Z9181 History of falling: Secondary | ICD-10-CM | POA: Diagnosis not present

## 2022-10-11 DIAGNOSIS — S42291D Other displaced fracture of upper end of right humerus, subsequent encounter for fracture with routine healing: Secondary | ICD-10-CM | POA: Diagnosis not present

## 2022-10-11 DIAGNOSIS — R2681 Unsteadiness on feet: Secondary | ICD-10-CM | POA: Diagnosis not present

## 2022-10-11 DIAGNOSIS — S2241XD Multiple fractures of ribs, right side, subsequent encounter for fracture with routine healing: Secondary | ICD-10-CM | POA: Diagnosis not present

## 2022-10-11 DIAGNOSIS — S065XAD Traumatic subdural hemorrhage with loss of consciousness status unknown, subsequent encounter: Secondary | ICD-10-CM | POA: Diagnosis not present

## 2022-10-11 DIAGNOSIS — I5032 Chronic diastolic (congestive) heart failure: Secondary | ICD-10-CM | POA: Diagnosis not present

## 2022-10-11 DIAGNOSIS — M6281 Muscle weakness (generalized): Secondary | ICD-10-CM | POA: Diagnosis not present

## 2022-10-11 DIAGNOSIS — R2689 Other abnormalities of gait and mobility: Secondary | ICD-10-CM | POA: Diagnosis not present

## 2022-10-11 DIAGNOSIS — I480 Paroxysmal atrial fibrillation: Secondary | ICD-10-CM | POA: Diagnosis not present

## 2022-10-15 DIAGNOSIS — I480 Paroxysmal atrial fibrillation: Secondary | ICD-10-CM | POA: Diagnosis not present

## 2022-10-15 DIAGNOSIS — R2681 Unsteadiness on feet: Secondary | ICD-10-CM | POA: Diagnosis not present

## 2022-10-15 DIAGNOSIS — R2689 Other abnormalities of gait and mobility: Secondary | ICD-10-CM | POA: Diagnosis not present

## 2022-10-15 DIAGNOSIS — S12000A Unspecified displaced fracture of first cervical vertebra, initial encounter for closed fracture: Secondary | ICD-10-CM | POA: Diagnosis not present

## 2022-10-15 DIAGNOSIS — I5032 Chronic diastolic (congestive) heart failure: Secondary | ICD-10-CM | POA: Diagnosis not present

## 2022-10-15 DIAGNOSIS — S065XAD Traumatic subdural hemorrhage with loss of consciousness status unknown, subsequent encounter: Secondary | ICD-10-CM | POA: Diagnosis not present

## 2022-10-15 DIAGNOSIS — M6281 Muscle weakness (generalized): Secondary | ICD-10-CM | POA: Diagnosis not present

## 2022-10-15 DIAGNOSIS — S22000B Wedge compression fracture of unspecified thoracic vertebra, initial encounter for open fracture: Secondary | ICD-10-CM | POA: Diagnosis not present

## 2022-10-15 DIAGNOSIS — Z9181 History of falling: Secondary | ICD-10-CM | POA: Diagnosis not present

## 2022-10-15 DIAGNOSIS — J9601 Acute respiratory failure with hypoxia: Secondary | ICD-10-CM | POA: Diagnosis not present

## 2022-10-16 DIAGNOSIS — S12000A Unspecified displaced fracture of first cervical vertebra, initial encounter for closed fracture: Secondary | ICD-10-CM | POA: Diagnosis not present

## 2022-10-16 DIAGNOSIS — S065XAD Traumatic subdural hemorrhage with loss of consciousness status unknown, subsequent encounter: Secondary | ICD-10-CM | POA: Diagnosis not present

## 2022-10-16 DIAGNOSIS — S22000B Wedge compression fracture of unspecified thoracic vertebra, initial encounter for open fracture: Secondary | ICD-10-CM | POA: Diagnosis not present

## 2022-10-16 DIAGNOSIS — I480 Paroxysmal atrial fibrillation: Secondary | ICD-10-CM | POA: Diagnosis not present

## 2022-10-16 DIAGNOSIS — I10 Essential (primary) hypertension: Secondary | ICD-10-CM | POA: Diagnosis not present

## 2022-10-17 ENCOUNTER — Other Ambulatory Visit: Payer: Self-pay | Admitting: *Deleted

## 2022-10-17 NOTE — Patient Outreach (Signed)
[facility] Post- Acute Care Coordinator  follow up. [name] resides in [facility] skilled nursing facility.  Screening for potential [facility] care coordination services as a benefit of health plan and primary care provider.  Met with [name] psychotherapist. [name]'s transition plan is to return to ALF.   No identifiable [facility] care coordination needs.   Dawn Noble, MSN, RN,BSN [facility] Post Acute Care Coordinator [phone] (Direct dial)

## 2022-10-18 DIAGNOSIS — I5032 Chronic diastolic (congestive) heart failure: Secondary | ICD-10-CM | POA: Diagnosis not present

## 2022-10-18 DIAGNOSIS — R2689 Other abnormalities of gait and mobility: Secondary | ICD-10-CM | POA: Diagnosis not present

## 2022-10-18 DIAGNOSIS — R2681 Unsteadiness on feet: Secondary | ICD-10-CM | POA: Diagnosis not present

## 2022-10-18 DIAGNOSIS — Z9181 History of falling: Secondary | ICD-10-CM | POA: Diagnosis not present

## 2022-10-18 DIAGNOSIS — I480 Paroxysmal atrial fibrillation: Secondary | ICD-10-CM | POA: Diagnosis not present

## 2022-10-18 DIAGNOSIS — M6281 Muscle weakness (generalized): Secondary | ICD-10-CM | POA: Diagnosis not present

## 2022-10-18 DIAGNOSIS — S065XAD Traumatic subdural hemorrhage with loss of consciousness status unknown, subsequent encounter: Secondary | ICD-10-CM | POA: Diagnosis not present

## 2022-10-19 DIAGNOSIS — S12000A Unspecified displaced fracture of first cervical vertebra, initial encounter for closed fracture: Secondary | ICD-10-CM | POA: Diagnosis not present

## 2022-10-22 DIAGNOSIS — I5032 Chronic diastolic (congestive) heart failure: Secondary | ICD-10-CM | POA: Diagnosis not present

## 2022-10-22 DIAGNOSIS — M6281 Muscle weakness (generalized): Secondary | ICD-10-CM | POA: Diagnosis not present

## 2022-10-22 DIAGNOSIS — S12000A Unspecified displaced fracture of first cervical vertebra, initial encounter for closed fracture: Secondary | ICD-10-CM | POA: Diagnosis not present

## 2022-10-22 DIAGNOSIS — S22000B Wedge compression fracture of unspecified thoracic vertebra, initial encounter for open fracture: Secondary | ICD-10-CM | POA: Diagnosis not present

## 2022-10-22 DIAGNOSIS — R2689 Other abnormalities of gait and mobility: Secondary | ICD-10-CM | POA: Diagnosis not present

## 2022-10-22 DIAGNOSIS — I10 Essential (primary) hypertension: Secondary | ICD-10-CM | POA: Diagnosis not present

## 2022-10-22 DIAGNOSIS — S065XAD Traumatic subdural hemorrhage with loss of consciousness status unknown, subsequent encounter: Secondary | ICD-10-CM | POA: Diagnosis not present

## 2022-10-22 DIAGNOSIS — I480 Paroxysmal atrial fibrillation: Secondary | ICD-10-CM | POA: Diagnosis not present

## 2022-10-22 DIAGNOSIS — R2681 Unsteadiness on feet: Secondary | ICD-10-CM | POA: Diagnosis not present

## 2022-10-22 DIAGNOSIS — Z9181 History of falling: Secondary | ICD-10-CM | POA: Diagnosis not present

## 2022-10-24 DIAGNOSIS — S42291D Other displaced fracture of upper end of right humerus, subsequent encounter for fracture with routine healing: Secondary | ICD-10-CM | POA: Diagnosis not present

## 2022-10-24 DIAGNOSIS — Z9181 History of falling: Secondary | ICD-10-CM | POA: Diagnosis not present

## 2022-10-24 DIAGNOSIS — S72141D Displaced intertrochanteric fracture of right femur, subsequent encounter for closed fracture with routine healing: Secondary | ICD-10-CM | POA: Diagnosis not present

## 2022-10-24 DIAGNOSIS — M6281 Muscle weakness (generalized): Secondary | ICD-10-CM | POA: Diagnosis not present

## 2022-10-24 DIAGNOSIS — R2689 Other abnormalities of gait and mobility: Secondary | ICD-10-CM | POA: Diagnosis not present

## 2022-10-25 DIAGNOSIS — I5032 Chronic diastolic (congestive) heart failure: Secondary | ICD-10-CM | POA: Diagnosis not present

## 2022-10-25 DIAGNOSIS — I480 Paroxysmal atrial fibrillation: Secondary | ICD-10-CM | POA: Diagnosis not present

## 2022-10-25 DIAGNOSIS — R2689 Other abnormalities of gait and mobility: Secondary | ICD-10-CM | POA: Diagnosis not present

## 2022-10-25 DIAGNOSIS — M6281 Muscle weakness (generalized): Secondary | ICD-10-CM | POA: Diagnosis not present

## 2022-10-25 DIAGNOSIS — S065XAD Traumatic subdural hemorrhage with loss of consciousness status unknown, subsequent encounter: Secondary | ICD-10-CM | POA: Diagnosis not present

## 2022-10-25 DIAGNOSIS — R2681 Unsteadiness on feet: Secondary | ICD-10-CM | POA: Diagnosis not present

## 2022-10-25 DIAGNOSIS — Z9181 History of falling: Secondary | ICD-10-CM | POA: Diagnosis not present

## 2022-10-26 DIAGNOSIS — I480 Paroxysmal atrial fibrillation: Secondary | ICD-10-CM | POA: Diagnosis not present

## 2022-10-26 DIAGNOSIS — I10 Essential (primary) hypertension: Secondary | ICD-10-CM | POA: Diagnosis not present

## 2022-10-26 DIAGNOSIS — S22000B Wedge compression fracture of unspecified thoracic vertebra, initial encounter for open fracture: Secondary | ICD-10-CM | POA: Diagnosis not present

## 2022-10-26 DIAGNOSIS — Z8673 Personal history of transient ischemic attack (TIA), and cerebral infarction without residual deficits: Secondary | ICD-10-CM | POA: Diagnosis not present

## 2022-10-30 ENCOUNTER — Emergency Department (HOSPITAL_COMMUNITY): Payer: Medicare Other

## 2022-10-30 ENCOUNTER — Other Ambulatory Visit: Payer: Self-pay

## 2022-10-30 ENCOUNTER — Emergency Department (HOSPITAL_COMMUNITY)
Admission: EM | Admit: 2022-10-30 | Discharge: 2022-10-30 | Disposition: A | Discharge status: Home / Self Care | Payer: Medicare Other | Attending: Emergency Medicine | Admitting: Emergency Medicine

## 2022-10-30 ENCOUNTER — Encounter (HOSPITAL_COMMUNITY): Payer: Self-pay

## 2022-10-30 DIAGNOSIS — Z7901 Long term (current) use of anticoagulants: Secondary | ICD-10-CM | POA: Diagnosis not present

## 2022-10-30 DIAGNOSIS — Z7401 Bed confinement status: Secondary | ICD-10-CM | POA: Diagnosis not present

## 2022-10-30 DIAGNOSIS — Z743 Need for continuous supervision: Secondary | ICD-10-CM | POA: Diagnosis not present

## 2022-10-30 DIAGNOSIS — I10 Essential (primary) hypertension: Secondary | ICD-10-CM | POA: Diagnosis not present

## 2022-10-30 DIAGNOSIS — R6889 Other general symptoms and signs: Secondary | ICD-10-CM | POA: Diagnosis not present

## 2022-10-30 DIAGNOSIS — I2111 ST elevation (STEMI) myocardial infarction involving right coronary artery: Secondary | ICD-10-CM | POA: Diagnosis not present

## 2022-10-30 DIAGNOSIS — M25512 Pain in left shoulder: Secondary | ICD-10-CM | POA: Insufficient documentation

## 2022-10-30 DIAGNOSIS — M40204 Unspecified kyphosis, thoracic region: Secondary | ICD-10-CM | POA: Diagnosis not present

## 2022-10-30 DIAGNOSIS — G459 Transient cerebral ischemic attack, unspecified: Secondary | ICD-10-CM | POA: Diagnosis not present

## 2022-10-30 DIAGNOSIS — R404 Transient alteration of awareness: Secondary | ICD-10-CM | POA: Diagnosis not present

## 2022-10-30 LAB — CBC WITH DIFFERENTIAL/PLATELET
Abs Immature Granulocytes: 0.02 10*3/uL (ref 0.00–0.07)
Basophils Absolute: 0.1 10*3/uL (ref 0.0–0.1)
Basophils Relative: 1 %
Eosinophils Absolute: 0.1 10*3/uL (ref 0.0–0.5)
Eosinophils Relative: 2 %
HCT: 44.8 % (ref 36.0–46.0)
Hemoglobin: 13.6 g/dL (ref 12.0–15.0)
Immature Granulocytes: 0 %
Lymphocytes Relative: 11 %
Lymphs Abs: 0.7 10*3/uL (ref 0.7–4.0)
MCH: 25 pg — ABNORMAL LOW (ref 26.0–34.0)
MCHC: 30.4 g/dL (ref 30.0–36.0)
MCV: 82.4 fL (ref 80.0–100.0)
Monocytes Absolute: 0.7 10*3/uL (ref 0.1–1.0)
Monocytes Relative: 10 %
Neutro Abs: 4.8 10*3/uL (ref 1.7–7.7)
Neutrophils Relative %: 76 %
Platelets: 354 10*3/uL (ref 150–400)
RBC: 5.44 MIL/uL — ABNORMAL HIGH (ref 3.87–5.11)
RDW: 17.9 % — ABNORMAL HIGH (ref 11.5–15.5)
WBC: 6.3 10*3/uL (ref 4.0–10.5)
nRBC: 0 % (ref 0.0–0.2)

## 2022-10-30 LAB — BASIC METABOLIC PANEL
Anion gap: 8 (ref 5–15)
BUN: 9 mg/dL (ref 8–23)
CO2: 30 mmol/L (ref 22–32)
Calcium: 8.5 mg/dL — ABNORMAL LOW (ref 8.9–10.3)
Chloride: 95 mmol/L — ABNORMAL LOW (ref 98–111)
Creatinine, Ser: 0.65 mg/dL (ref 0.44–1.00)
GFR, Estimated: >60 mL/min (ref >60)
Glucose, Bld: 102 mg/dL — ABNORMAL HIGH (ref 70–99)
Potassium: 3.4 mmol/L — ABNORMAL LOW (ref 3.5–5.1)
Sodium: 133 mmol/L — ABNORMAL LOW (ref 135–145)

## 2022-10-30 LAB — TROPONIN I (HIGH SENSITIVITY): Troponin I (High Sensitivity): 9 ng/L (ref <18)

## 2022-10-30 MED ORDER — FENTANYL CITRATE PF 50 MCG/ML IJ SOSY
50.0000 ug | PREFILLED_SYRINGE | Freq: Once | INTRAMUSCULAR | Status: AC
  Administered 2022-10-30: 50 ug via INTRAMUSCULAR
  Filled 2022-10-30: qty 1

## 2022-10-30 MED ORDER — OXYCODONE HCL 5 MG PO TABS: 5.0000 mg | ORAL_TABLET | ORAL | 0 refills | Status: DC | PRN

## 2022-10-30 MED ORDER — KETOROLAC TROMETHAMINE 60 MG/2ML IM SOLN
30.0000 mg | Freq: Once | INTRAMUSCULAR | Status: AC
  Administered 2022-10-30: 30 mg via INTRAMUSCULAR
  Filled 2022-10-30: qty 2

## 2022-10-30 MED ORDER — LIDOCAINE 5 % EX PTCH
1.0000 | MEDICATED_PATCH | CUTANEOUS | Status: DC
  Administered 2022-10-30: 1 via TRANSDERMAL
  Filled 2022-10-30: qty 1

## 2022-10-30 MED ORDER — OXYCODONE HCL 5 MG PO TABS
5.0000 mg | ORAL_TABLET | Freq: Once | ORAL | Status: AC
  Administered 2022-10-30: 5 mg via ORAL
  Filled 2022-10-30: qty 1

## 2022-10-30 MED ORDER — 1ST MEDX-PATCH/ LIDOCAINE 4-0.025-5-20 % EX PTCH: 1.0000 | MEDICATED_PATCH | Freq: Every day | CUTANEOUS | 0 refills | Status: AC | PRN

## 2022-10-30 MED ORDER — ACETAMINOPHEN 500 MG PO TABS
1000.0000 mg | ORAL_TABLET | Freq: Once | ORAL | Status: AC
  Administered 2022-10-30: 1000 mg via ORAL
  Filled 2022-10-30: qty 2

## 2022-10-30 NOTE — ED Notes (Signed)
Ptar called for pt by FM

## 2022-10-30 NOTE — ED Notes (Signed)
PTAR on the way FM

## 2022-10-30 NOTE — Discharge Instructions (Addendum)
Only use sling as needed for short periods for comfort. Not to be worn for longer than a few hours a day for risk of adhesive capsulitis/frozen shoulder.

## 2022-10-30 NOTE — ED Triage Notes (Signed)
Pt from [facility] c/o left shoulder pain from 3 weeks ago. Seen here at time of fall. Pt denies falling today. Per EMS staff gave 5mg  Oxycodone at 0030 but pt asked to come to ED as states it didn't help. Limited ROM to left shoulder due to pain. Left radial pulse +2, Color, warmth, sensation intact.

## 2022-10-30 NOTE — ED Provider Notes (Signed)
EMERGENCY DEPARTMENT AT [facility] Provider Note   CSN: [id number] Arrival date & time: 10/30/22  8413     History  Chief Complaint  Patient presents with   Shoulder Pain    [name] is a 89 y.o. female.  89 yo F here with pain from her back (known fractures) that wraps around her left shoulder blade into her left chest. Worse with certain movements. Has had multiple falls over last year, unsure how long the fracture has been there but it's been 'awhile'. Takes oxycodone at her ALF.    Shoulder Pain      Home Medications Prior to Admission medications   Medication Sig Start Date End Date Taking? Authorizing Provider  acetaminophen (TYLENOL) 500 MG tablet Take 2 tablets (1,000 mg total) by mouth every 8 (eight) hours for 5 days, THEN 2 tablets (1,000 mg total) every 8 (eight) hours as needed for up to 25 days. 10/09/22 11/08/22  Almon Hercules, MD  alendronate (FOSAMAX) 70 MG tablet Take 1 tablet (70 mg total) by mouth once a week. Take with a full glass of water on an empty stomach. 12/19/21   Wanda Plump, MD  amiodarone (PACERONE) 200 MG tablet Take 200 mg by mouth daily. 06/18/22   [provider]  amLODipine (NORVASC) 5 MG tablet Take 1 tablet (5 mg total) by mouth daily. 12/19/21   Wanda Plump, MD  apixaban (ELIQUIS) 2.5 MG TABS tablet Take 1 tablet (2.5 mg total) by mouth 2 (two) times daily. Patient taking differently: Take 2.5 mg by mouth daily. 02/28/22   Arty Baumgartner, NP  calcium carbonate (TUMS - DOSED IN MG ELEMENTAL CALCIUM) 500 MG chewable tablet Chew 1 tablet (200 mg of elemental calcium total) by mouth 3 (three) times daily as needed for indigestion or heartburn. 07/28/22   Dorcas Carrow, MD  clopidogrel (PLAVIX) 75 MG tablet Take 1 tablet (75 mg total) by mouth daily. 03/01/22   Arty Baumgartner, NP  docusate sodium (COLACE) 100 MG capsule Take 1 capsule (100 mg total) by mouth 2 (two) times daily. 07/27/22   Dorcas Carrow, MD   lidocaine (LIDODERM) 5 % Place 1 patch onto the skin daily. Remove & Discard patch within 12 hours or as directed by MD 10/10/22   Almon Hercules, MD  LORazepam (ATIVAN) 0.5 MG tablet Take 1 tablet (0.5 mg total) by mouth 2 (two) times daily. 10/09/22 11/08/22  Almon Hercules, MD  metoprolol tartrate (LOPRESSOR) 50 MG tablet Take 1 tablet (50 mg total) by mouth 2 (two) times daily. 12/19/21   Wanda Plump, MD  nitroGLYCERIN (NITROSTAT) 0.4 MG SL tablet Place 1 tablet (0.4 mg total) under the tongue every 5 (five) minutes x 3 doses as needed for chest pain. 02/28/22   Arty Baumgartner, NP  ondansetron (ZOFRAN-ODT) 4 MG disintegrating tablet Take 4 mg by mouth every 8 (eight) hours as needed. 08/24/22   [provider]  pantoprazole (PROTONIX) 40 MG tablet Take 1 tablet (40 mg total) by mouth at bedtime. Patient taking differently: Take 40 mg by mouth daily. 02/28/22   Arty Baumgartner, NP  polyethylene glycol (MIRALAX / GLYCOLAX) 17 g packet Take 17 g by mouth daily. 07/27/22   Dorcas Carrow, MD  rosuvastatin (CRESTOR) 20 MG tablet Take 1 tablet (20 mg total) by mouth daily. Patient taking differently: Take 20 mg by mouth at bedtime. 03/01/22   Arty Baumgartner, NP  sertraline (ZOLOFT) 25  MG tablet Take 12.5 mg by mouth at bedtime. 06/18/22   [provider]      Allergies    Penicillins, Valsartan, Atorvastatin, Carvedilol, Ezetimibe, Fluvastatin sodium, Magnesium hydroxide, Meloxicam, Pneumovax [pneumococcal polysaccharide vaccine], Quinapril hcl, Simvastatin, Topamax [topiramate], and Vit d-vit e-safflower oil    Review of Systems   Review of Systems  Physical Exam Updated Vital Signs BP (!) 147/73 (BP Location: Right Arm)   Pulse 60   Temp 98.1 F (36.7 C) (Oral)   Resp 16   Ht 5\' 6"  (1.676 m)   Wt 49.9 kg   SpO2 94%   BMI 17.75 kg/m  Physical Exam Vitals and nursing note reviewed.  Constitutional:      Appearance: She is well-developed.  HENT:     Head:  Normocephalic and atraumatic.  Eyes:     Pupils: Pupils are equal, round, and reactive to light.  Cardiovascular:     Rate and Rhythm: Normal rate and regular rhythm.  Pulmonary:     Effort: No respiratory distress.     Breath sounds: No stridor.  Abdominal:     General: There is no distension.  Musculoskeletal:     Cervical back: Normal range of motion.     Comments: Decreased ROM of L shoulder 2/2 pain Pain with palpation of left upper thoracic vertebral and paravertebral musculature without noticeable spasm.   Neurological:     General: No focal deficit present.     Mental Status: She is alert.     ED Results / Procedures / Treatments   Labs (all labs ordered are listed, but only abnormal results are displayed) Labs Reviewed - No data to display  EKG None  Radiology No results found.  Procedures Procedures    Medications Ordered in ED Medications  lidocaine (LIDODERM) 5 % 1 patch (has no administration in time range)  fentaNYL (SUBLIMAZE) injection 50 mcg (has no administration in time range)    ED Course/ Medical Decision Making/ A&P                             Medical Decision Making Amount and/or Complexity of Data Reviewed Labs: ordered. Radiology: ordered. ECG/medicine tests: ordered.  Risk Prescription drug management.  Unclear if she has fallen since most recent imagine so will recheck to ensure no worsening or new fractures. Also consider muscular causes such as frozen shoulder from the pain. Will treat symptomatically for now.  Mild worsening of compression fracture but not completely sure that it explains her symptoms. Pain improved now but still present and in left side of chest/breast so will get ecg and one troponin.      Final Clinical Impression(s) / ED Diagnoses Final diagnoses:  None    Rx / DC Orders ED Discharge Orders     None

## 2022-10-31 ENCOUNTER — Other Ambulatory Visit: Payer: Self-pay | Admitting: *Deleted

## 2022-10-31 NOTE — Patient Outreach (Signed)
Per [facility] Mrs. [name] discharged from [facility]  skilled nursing facility on 10/26/22. Screening for potential [facility] care coordination services as benefit of health plan.   Per SNF social worker, Mrs. Tufts plan was to return to ALF.   Raiford Noble, MSN, RN,BSN [facility] Post Acute Care Coordinator [phone] (Direct dial)

## 2022-11-05 DIAGNOSIS — M79604 Pain in right leg: Secondary | ICD-10-CM | POA: Diagnosis not present

## 2022-11-06 DIAGNOSIS — S065XAD Traumatic subdural hemorrhage with loss of consciousness status unknown, subsequent encounter: Secondary | ICD-10-CM | POA: Diagnosis not present

## 2022-11-06 DIAGNOSIS — M6281 Muscle weakness (generalized): Secondary | ICD-10-CM | POA: Diagnosis not present

## 2022-11-06 DIAGNOSIS — I82491 Acute embolism and thrombosis of other specified deep vein of right lower extremity: Secondary | ICD-10-CM | POA: Diagnosis not present

## 2022-11-06 DIAGNOSIS — R531 Weakness: Secondary | ICD-10-CM | POA: Diagnosis not present

## 2022-11-06 DIAGNOSIS — R2689 Other abnormalities of gait and mobility: Secondary | ICD-10-CM | POA: Diagnosis not present

## 2022-11-06 DIAGNOSIS — R2681 Unsteadiness on feet: Secondary | ICD-10-CM | POA: Diagnosis not present

## 2022-11-06 DIAGNOSIS — M79604 Pain in right leg: Secondary | ICD-10-CM | POA: Diagnosis not present

## 2022-11-07 DIAGNOSIS — I10 Essential (primary) hypertension: Secondary | ICD-10-CM | POA: Diagnosis not present

## 2022-11-07 DIAGNOSIS — I480 Paroxysmal atrial fibrillation: Secondary | ICD-10-CM | POA: Diagnosis not present

## 2022-11-07 DIAGNOSIS — M6281 Muscle weakness (generalized): Secondary | ICD-10-CM | POA: Diagnosis not present

## 2022-11-07 DIAGNOSIS — S065XAD Traumatic subdural hemorrhage with loss of consciousness status unknown, subsequent encounter: Secondary | ICD-10-CM | POA: Diagnosis not present

## 2022-11-07 DIAGNOSIS — S12000A Unspecified displaced fracture of first cervical vertebra, initial encounter for closed fracture: Secondary | ICD-10-CM | POA: Diagnosis not present

## 2022-11-07 DIAGNOSIS — R2681 Unsteadiness on feet: Secondary | ICD-10-CM | POA: Diagnosis not present

## 2022-11-07 DIAGNOSIS — R531 Weakness: Secondary | ICD-10-CM | POA: Diagnosis not present

## 2022-11-07 DIAGNOSIS — R2689 Other abnormalities of gait and mobility: Secondary | ICD-10-CM | POA: Diagnosis not present

## 2022-11-07 DIAGNOSIS — S22000B Wedge compression fracture of unspecified thoracic vertebra, initial encounter for open fracture: Secondary | ICD-10-CM | POA: Diagnosis not present

## 2022-11-08 DIAGNOSIS — R2681 Unsteadiness on feet: Secondary | ICD-10-CM | POA: Diagnosis not present

## 2022-11-08 DIAGNOSIS — R531 Weakness: Secondary | ICD-10-CM | POA: Diagnosis not present

## 2022-11-08 DIAGNOSIS — M6281 Muscle weakness (generalized): Secondary | ICD-10-CM | POA: Diagnosis not present

## 2022-11-08 DIAGNOSIS — R2689 Other abnormalities of gait and mobility: Secondary | ICD-10-CM | POA: Diagnosis not present

## 2022-11-08 DIAGNOSIS — S065XAD Traumatic subdural hemorrhage with loss of consciousness status unknown, subsequent encounter: Secondary | ICD-10-CM | POA: Diagnosis not present

## 2022-11-09 DIAGNOSIS — R2689 Other abnormalities of gait and mobility: Secondary | ICD-10-CM | POA: Diagnosis not present

## 2022-11-09 DIAGNOSIS — M6281 Muscle weakness (generalized): Secondary | ICD-10-CM | POA: Diagnosis not present

## 2022-11-09 DIAGNOSIS — S065XAD Traumatic subdural hemorrhage with loss of consciousness status unknown, subsequent encounter: Secondary | ICD-10-CM | POA: Diagnosis not present

## 2022-11-09 DIAGNOSIS — R531 Weakness: Secondary | ICD-10-CM | POA: Diagnosis not present

## 2022-11-09 DIAGNOSIS — R2681 Unsteadiness on feet: Secondary | ICD-10-CM | POA: Diagnosis not present

## 2022-11-11 DIAGNOSIS — S065XAD Traumatic subdural hemorrhage with loss of consciousness status unknown, subsequent encounter: Secondary | ICD-10-CM | POA: Diagnosis not present

## 2022-11-11 DIAGNOSIS — R2681 Unsteadiness on feet: Secondary | ICD-10-CM | POA: Diagnosis not present

## 2022-11-11 DIAGNOSIS — M6281 Muscle weakness (generalized): Secondary | ICD-10-CM | POA: Diagnosis not present

## 2022-11-11 DIAGNOSIS — R531 Weakness: Secondary | ICD-10-CM | POA: Diagnosis not present

## 2022-11-11 DIAGNOSIS — R2689 Other abnormalities of gait and mobility: Secondary | ICD-10-CM | POA: Diagnosis not present

## 2022-11-12 DIAGNOSIS — I5032 Chronic diastolic (congestive) heart failure: Secondary | ICD-10-CM | POA: Diagnosis not present

## 2022-11-12 DIAGNOSIS — M6281 Muscle weakness (generalized): Secondary | ICD-10-CM | POA: Diagnosis not present

## 2022-11-12 DIAGNOSIS — R2689 Other abnormalities of gait and mobility: Secondary | ICD-10-CM | POA: Diagnosis not present

## 2022-11-12 DIAGNOSIS — I480 Paroxysmal atrial fibrillation: Secondary | ICD-10-CM | POA: Diagnosis not present

## 2022-11-13 DIAGNOSIS — I480 Paroxysmal atrial fibrillation: Secondary | ICD-10-CM | POA: Diagnosis not present

## 2022-11-13 DIAGNOSIS — I10 Essential (primary) hypertension: Secondary | ICD-10-CM | POA: Diagnosis not present

## 2022-11-13 DIAGNOSIS — I5032 Chronic diastolic (congestive) heart failure: Secondary | ICD-10-CM | POA: Diagnosis not present

## 2022-11-13 DIAGNOSIS — R2689 Other abnormalities of gait and mobility: Secondary | ICD-10-CM | POA: Diagnosis not present

## 2022-11-13 DIAGNOSIS — S12000A Unspecified displaced fracture of first cervical vertebra, initial encounter for closed fracture: Secondary | ICD-10-CM | POA: Diagnosis not present

## 2022-11-13 DIAGNOSIS — S22000B Wedge compression fracture of unspecified thoracic vertebra, initial encounter for open fracture: Secondary | ICD-10-CM | POA: Diagnosis not present

## 2022-11-13 DIAGNOSIS — M6281 Muscle weakness (generalized): Secondary | ICD-10-CM | POA: Diagnosis not present

## 2022-11-15 DIAGNOSIS — I480 Paroxysmal atrial fibrillation: Secondary | ICD-10-CM | POA: Diagnosis not present

## 2022-11-15 DIAGNOSIS — M6281 Muscle weakness (generalized): Secondary | ICD-10-CM | POA: Diagnosis not present

## 2022-11-15 DIAGNOSIS — I10 Essential (primary) hypertension: Secondary | ICD-10-CM | POA: Diagnosis not present

## 2022-11-15 DIAGNOSIS — R2689 Other abnormalities of gait and mobility: Secondary | ICD-10-CM | POA: Diagnosis not present

## 2022-11-15 DIAGNOSIS — F32A Depression, unspecified: Secondary | ICD-10-CM | POA: Diagnosis not present

## 2022-11-15 DIAGNOSIS — E785 Hyperlipidemia, unspecified: Secondary | ICD-10-CM | POA: Diagnosis not present

## 2022-11-15 DIAGNOSIS — I5032 Chronic diastolic (congestive) heart failure: Secondary | ICD-10-CM | POA: Diagnosis not present

## 2022-11-16 DIAGNOSIS — M6281 Muscle weakness (generalized): Secondary | ICD-10-CM | POA: Diagnosis not present

## 2022-11-16 DIAGNOSIS — I480 Paroxysmal atrial fibrillation: Secondary | ICD-10-CM | POA: Diagnosis not present

## 2022-11-16 DIAGNOSIS — R2689 Other abnormalities of gait and mobility: Secondary | ICD-10-CM | POA: Diagnosis not present

## 2022-11-16 DIAGNOSIS — I5032 Chronic diastolic (congestive) heart failure: Secondary | ICD-10-CM | POA: Diagnosis not present

## 2022-11-20 NOTE — Progress Notes (Signed)
Palliative:  HPI: 89 y.o. female  with past medical history of HTN, polycythemia, depression, GERD, CVA, PAF/PE/DVT on Eliquis, takatsubo cardiomyopathy, hospitalization in March for fall resulting in humerus fracture, rib fractures and femur fracture and discharged to assisted living, admitted on 09/28/2022 after a fall resulting in subdural hematoma, T3 and C7 compressions fractures. Neurosurgery consulted and no recommendations for subdural hematoma- no current residual deficits. Kyphoplasty planned for tomorrow 5/23. Palliative medicine consulted for advanced care planning due to patient high risk for further falls on anticoagulation. Dr. Allena Katz attempted to discuss code status with patient- recommended DNR, however, patient did not want to discuss.     I met today at Ms. Mares's bedside along with daughter, [name]. [name] is awaiting kyphoplasty. She tells me that she has had this procedure before. She is very concerned about her pain and expressing significant pain burden currently. I ordered her fentanyl IV for now and reassure her that IR will provide her with more pain medication when she goes for her procedure.   There is concern expressed by family at bedside that Ms. Gibbon does not always follow instructions and recommendations to keep her safe. She has had many falls. At this current time family wishes to refer goals of care discussions until after procedure as Ms. Kinnick is already worried enough about the procedure. I will follow up on later date.   All questions/concerns addressed. Emotional support provided.   Exam: Lying in bed. Alert, oriented. No distress. C-collar in place. Oxygen in place. Breathing regular, unlabored. Abd soft.   Plan: - Awaiting kyphoplasty.  - Ongoing GOC conversation needed.  25 min  Yong Channel, NP Palliative Medicine Team Pager [phone] (Please see [email or web address] for schedule) Team Phone [phone]    Greater than 50%  of this time was  spent counseling and coordinating care related to the above assessment and plan

## 2022-11-23 DIAGNOSIS — R2689 Other abnormalities of gait and mobility: Secondary | ICD-10-CM | POA: Diagnosis not present

## 2022-11-23 DIAGNOSIS — Z9181 History of falling: Secondary | ICD-10-CM | POA: Diagnosis not present

## 2022-11-23 DIAGNOSIS — M6281 Muscle weakness (generalized): Secondary | ICD-10-CM | POA: Diagnosis not present

## 2022-11-23 DIAGNOSIS — S42291D Other displaced fracture of upper end of right humerus, subsequent encounter for fracture with routine healing: Secondary | ICD-10-CM | POA: Diagnosis not present

## 2022-11-23 DIAGNOSIS — S72141D Displaced intertrochanteric fracture of right femur, subsequent encounter for closed fracture with routine healing: Secondary | ICD-10-CM | POA: Diagnosis not present

## 2022-11-27 ENCOUNTER — Other Ambulatory Visit: Payer: Self-pay | Admitting: *Deleted

## 2022-11-27 NOTE — Patient Outreach (Signed)
[name] resides in [place]  skilled nursing facility. Screening for potential care coordination services as benefit of health plan.  Update received from [name] psychotherapist. [name] has transitioned to long term care bed at [facility].   Dawn Noble, MSN, RN,BSN [facility] Post Acute Care Coordinator [phone] (Direct dial)

## 2022-12-14 DIAGNOSIS — I480 Paroxysmal atrial fibrillation: Secondary | ICD-10-CM | POA: Diagnosis not present

## 2022-12-14 DIAGNOSIS — E785 Hyperlipidemia, unspecified: Secondary | ICD-10-CM | POA: Diagnosis not present

## 2022-12-14 DIAGNOSIS — F32A Depression, unspecified: Secondary | ICD-10-CM | POA: Diagnosis not present

## 2022-12-14 DIAGNOSIS — I2510 Atherosclerotic heart disease of native coronary artery without angina pectoris: Secondary | ICD-10-CM | POA: Diagnosis not present

## 2022-12-24 DIAGNOSIS — S42291D Other displaced fracture of upper end of right humerus, subsequent encounter for fracture with routine healing: Secondary | ICD-10-CM | POA: Diagnosis not present

## 2022-12-24 DIAGNOSIS — M6281 Muscle weakness (generalized): Secondary | ICD-10-CM | POA: Diagnosis not present

## 2022-12-24 DIAGNOSIS — S72141D Displaced intertrochanteric fracture of right femur, subsequent encounter for closed fracture with routine healing: Secondary | ICD-10-CM | POA: Diagnosis not present

## 2022-12-24 DIAGNOSIS — Z9181 History of falling: Secondary | ICD-10-CM | POA: Diagnosis not present

## 2022-12-24 DIAGNOSIS — R2689 Other abnormalities of gait and mobility: Secondary | ICD-10-CM | POA: Diagnosis not present

## 2023-01-03 ENCOUNTER — Emergency Department (HOSPITAL_COMMUNITY): Payer: Medicare Other

## 2023-01-03 ENCOUNTER — Other Ambulatory Visit: Payer: Self-pay

## 2023-01-03 ENCOUNTER — Emergency Department (HOSPITAL_COMMUNITY)
Admission: EM | Admit: 2023-01-03 | Discharge: 2023-01-03 | Disposition: A | Discharge status: Home / Self Care | Payer: Medicare Other | Attending: Emergency Medicine | Admitting: Emergency Medicine

## 2023-01-03 DIAGNOSIS — I2510 Atherosclerotic heart disease of native coronary artery without angina pectoris: Secondary | ICD-10-CM | POA: Insufficient documentation

## 2023-01-03 DIAGNOSIS — Z743 Need for continuous supervision: Secondary | ICD-10-CM | POA: Diagnosis not present

## 2023-01-03 DIAGNOSIS — Z7901 Long term (current) use of anticoagulants: Secondary | ICD-10-CM | POA: Diagnosis not present

## 2023-01-03 DIAGNOSIS — I160 Hypertensive urgency: Secondary | ICD-10-CM | POA: Diagnosis not present

## 2023-01-03 DIAGNOSIS — R918 Other nonspecific abnormal finding of lung field: Secondary | ICD-10-CM | POA: Diagnosis not present

## 2023-01-03 DIAGNOSIS — Z7902 Long term (current) use of antithrombotics/antiplatelets: Secondary | ICD-10-CM | POA: Diagnosis not present

## 2023-01-03 DIAGNOSIS — I25110 Atherosclerotic heart disease of native coronary artery with unstable angina pectoris: Secondary | ICD-10-CM | POA: Diagnosis not present

## 2023-01-03 DIAGNOSIS — R0789 Other chest pain: Secondary | ICD-10-CM | POA: Diagnosis not present

## 2023-01-03 DIAGNOSIS — Z7401 Bed confinement status: Secondary | ICD-10-CM | POA: Diagnosis not present

## 2023-01-03 DIAGNOSIS — R079 Chest pain, unspecified: Secondary | ICD-10-CM

## 2023-01-03 LAB — BASIC METABOLIC PANEL
Anion gap: 11 (ref 5–15)
BUN: 8 mg/dL (ref 8–23)
CO2: 28 mmol/L (ref 22–32)
Calcium: 9.3 mg/dL (ref 8.9–10.3)
Chloride: 98 mmol/L (ref 98–111)
Creatinine, Ser: 0.7 mg/dL (ref 0.44–1.00)
GFR, Estimated: >60 mL/min (ref >60)
Glucose, Bld: 89 mg/dL (ref 70–99)
Potassium: 3.7 mmol/L (ref 3.5–5.1)
Sodium: 137 mmol/L (ref 135–145)

## 2023-01-03 LAB — CBC WITH DIFFERENTIAL/PLATELET
Abs Immature Granulocytes: 0.03 10*3/uL (ref 0.00–0.07)
Basophils Absolute: 0.1 10*3/uL (ref 0.0–0.1)
Basophils Relative: 1 %
Eosinophils Absolute: 0.1 10*3/uL (ref 0.0–0.5)
Eosinophils Relative: 2 %
HCT: 41.5 % (ref 36.0–46.0)
Hemoglobin: 12.4 g/dL (ref 12.0–15.0)
Immature Granulocytes: 1 %
Lymphocytes Relative: 10 %
Lymphs Abs: 0.7 10*3/uL (ref 0.7–4.0)
MCH: 25.4 pg — ABNORMAL LOW (ref 26.0–34.0)
MCHC: 29.9 g/dL — ABNORMAL LOW (ref 30.0–36.0)
MCV: 85 fL (ref 80.0–100.0)
Monocytes Absolute: 0.8 10*3/uL (ref 0.1–1.0)
Monocytes Relative: 12 %
Neutro Abs: 4.9 10*3/uL (ref 1.7–7.7)
Neutrophils Relative %: 74 %
Platelets: 387 10*3/uL (ref 150–400)
RBC: 4.88 MIL/uL (ref 3.87–5.11)
RDW: 17.2 % — ABNORMAL HIGH (ref 11.5–15.5)
WBC: 6.6 10*3/uL (ref 4.0–10.5)
nRBC: 0 % (ref 0.0–0.2)

## 2023-01-03 LAB — TROPONIN I (HIGH SENSITIVITY)
Troponin I (High Sensitivity): 13 ng/L (ref <18)
Troponin I (High Sensitivity): 15 ng/L (ref <18)

## 2023-01-03 MED ORDER — ASPIRIN 81 MG PO CHEW
324.0000 mg | CHEWABLE_TABLET | Freq: Once | ORAL | Status: AC
  Administered 2023-01-03: 324 mg via ORAL
  Filled 2023-01-03: qty 4

## 2023-01-03 MED ORDER — ISOSORBIDE MONONITRATE ER 30 MG PO TB24: 30.0000 mg | ORAL_TABLET | Freq: Every day | ORAL | Status: DC

## 2023-01-03 MED ORDER — HYDRALAZINE HCL 10 MG PO TABS: 10.0000 mg | ORAL_TABLET | Freq: Once | ORAL | Status: DC

## 2023-01-03 MED ORDER — AMLODIPINE BESYLATE 5 MG PO TABS: 10.0000 mg | ORAL_TABLET | Freq: Once | ORAL | Status: DC

## 2023-01-03 NOTE — Discharge Instructions (Addendum)
You are going to be prescribed a new medicine called Imdur.  Otherwise, follow-up with your cardiologist for your chest pain and elevated blood pressure.  If you develop recurrent, continued, or worsening chest pain, shortness of breath, fever, vomiting, abdominal or back pain, or any other new/concerning symptoms then return to the ER for evaluation.

## 2023-01-03 NOTE — Consult Note (Addendum)
Cardiology Consultation   Patient ID: [name] MRN: [id number]; DOB: [birth year]  Admit date: 01/03/2023 Date of Consult: 01/03/2023  PCP:  [facility]   [facility] Providers Cardiologist:  Norman Herrlich, MD   {    Patient Profile:   [name] is a 89 y.o. female with a hx of CAD s/p atherectomy and intracoronary lithotripsy and DES to ostial/prox RCA 02/2022, paroxysmal atrial fibrillation, Takotsubo cardiomyopathy in 1990, chronic HFmrEF, CVA, Raynaud's phenomena, polycythemia, hypertension, depression, GERD, PE/DVT, subdural hematoma May/2024, C7 and T3 compression fracture May/2024,  who is being seen 01/03/2023 for the evaluation of chest pain at the request of Dr. Criss Alvine.   History of Present Illness:   [name] with above past medical history presented to the ER complaining chest pain today.  Patient states she lives in an assisted living facility. She started having mid-sternum chest pain last night while watching TV. She states it felt like pressure, lasted about 30-40 minutes and went away with Nitroglycerin. She had recurrent similar chest pain today   around 9am, pain got better after getting Nitroglycerin. She states she is not able to walk at baseline, mostly wheelchair and bed bound. She is always SOB, at rest or with any activity, appears to be chronic and stable. She denied any current chest pain, dizziness, syncope, nausea, fever, chills, abdominal pain, diarrhea, emesis. She states " I should fine now". She is not particularly interested in invasive workup when discussed this topic. She states her BP was high at 170s yesterday. She states she has been feeling tired and wants to sleep all days, wondering if this is due to her pain medication Tylenol.   Admission diagnostic so far revealed unremarkable CBC differential, BMP.  High sensitive troponin 13 > 15. Chest x-ray revealed mild bibasilar subsegmental  atelectasis/infiltrate. EKG today showed sinus rhythm 76 bpm, nonspecific T wave flattening, no acute ischemic EKG change comparing to EKG from 10/30/2022.  Per chart review, patient historically follows Dr. Dulce Sellar for CAD and diastolic heart failure, reportedly had suffered Takotsubo cardiomyopathy in 1990s with normal coronary angiography.  She had repeat cardiac catheterization 2003 which revealed mild nonobstructive CAD.  She had LVEF 40% in the past that was recovered in January 2020 to 60%.  She was hospitalized for inferior STEMI 02/2022 here, cardiac catheterization revealed single-vessel obstructive CAD with severe ostial RCA stenosis with heavy calcification, and nonobstructive disease for 45% LM, 30% mid LAD, 30% distal LAD, 30% ostial to proximal circumflex, 20% ramus.  LV systolic function was normal.  LVEDP normal.  She underwent staged PCI on 02/26/2022 for ostial/proximal RCA utilizing orbital atherectomy and intracoronary lithotripsy with placement of a DES.  She was noted in new onset of paroxysmal A-fib and placed on short course of amiodarone, converted on amiodarone to sinus rhythm, and was started on Eliquis.  She was initially placed on ASA/Brilinta and discharged on plavix and eliquis ultimately.  Echocardiogram from 02/24/2022 revealed LVEF 45 to 50%, basal RCA territory hypokinesis, severe asymmetric LVH, grade 2 DD, normal RV, mildly increased RV wall thickness, moderate LAE and RAE, trivial MR, mild aortic stenosis with mean gradient 10 mmHg. She was placed on metoprolol and amlodipine for medical therapy, with plan to transition to [facility] for GDMT.  She was instructed to take as needed Lasix 20 mg daily if heart failure symptom occurs.  She has not followed up with cardiology office since.   She was recently hospitalized here from 09/28/2022  to 10/09/2022 for unwitnessed fall, found to have subdural hematoma, C7 and T3 compression fracture.  She was seen by neurosurgery and recommended  conservative management with cervical collar.  T3 kyphoplasty was attempted by IR but was aborted due to patient not able to position herself for the procedure.  Eliquis was ultimately resumed on 10/07/2022 and Plavix was resumed 10/08/2022.  She was discharged to SNF.   Marland Kitchen   Past Medical History:  Diagnosis Date   CAD (coronary artery disease)    mild nonobstructive disease on cath in 2003   Cancer [facility] [address] [address])    Cardiomyopathy    Probable Takotsubo, severe CP w/ normal cath in 1994. Severe CP in 2003 w/ widespread T wave inversions on ECG. Cath w/ minimal coronary disease but LV-gram showed periapical severe hypokinesis and basilar hyperkinesia (EF 40%). Last echo in 4/09 confirmed full LV functional recovery with EF 60%, no regional wall motion abnormalities, mild to moderate LVH.   Chronic diastolic CHF (congestive heart failure) (HCC) 09/13/2019   CVA (cerebral infarction)    Depression with anxiety 03/29/2011   lost husband 3'09   DVT (deep venous thrombosis) (HCC)    after venous ablation, R leg   Fracture 09/10/2007   L2, status post vertebroplasty of L2 performed by IR   GERD (gastroesophageal reflux disease)    Hemorrhoids    Hiatal hernia 03/29/2011   no nerve problems   Hyperlipidemia    Hyperplastic colon polyp 06/2001   Hypertension    Iron deficiency anemia due to chronic blood loss 12/30/2017   OA (osteoarthritis) of knee 03/29/2011   w/ bilateral knee pain-not a problem now   OSA (obstructive sleep apnea) 03/29/2011   no cpap used, not a problem now.   Osteoporosis    Otalgia of both ears    Dr. Emeline Darling   Polycythemia    Stroke [facility]) 03/29/2011   CVA x2 -last 10'12-?TIA(visual problems)   Varicose vein     Past Surgical History:  Procedure Laterality Date   APPENDECTOMY     BACK SURGERY     BREAST BIOPSY     BREAST CYST EXCISION     BREAST LUMPECTOMY Left 2013   left stage I left breast cancer   CHOLECYSTECTOMY  04/09/2011   Procedure: LAPAROSCOPIC  CHOLECYSTECTOMY WITH INTRAOPERATIVE CHOLANGIOGRAM;  Surgeon: Adolph Pollack, MD;  Location: [facility] ORS;  Service: General;  Laterality: N/A;   CORONARY ATHERECTOMY N/A 02/26/2022   Procedure: CORONARY ATHERECTOMY;  Surgeon: Yvonne Kendall, MD;  Location: [facility] INVASIVE CV LAB;  Service: Cardiovascular;  Laterality: N/A;   CORONARY LITHOTRIPSY N/A 02/26/2022   Procedure: CORONARY LITHOTRIPSY;  Surgeon: Yvonne Kendall, MD;  Location: [facility] INVASIVE CV LAB;  Service: Cardiovascular;  Laterality: N/A;   CORONARY STENT INTERVENTION N/A 02/26/2022   Procedure: CORONARY STENT INTERVENTION;  Surgeon: Yvonne Kendall, MD;  Location: [facility] IN[facility]AS[facility] CV LAB;  Service: Cardiovascular;  Laterality: N/A;   CORONARY ULTRASOUND/IVUS N/A 02/26/2022   Procedure: Intravascular Ultrasound/IVUS;  Surgeon: Yvonne Kendall, MD;  Location: [facility];  Service: Cardiovascular;  Laterality: N/A;   Dental Extraction     L maxillary molar   FEMUR IM NAIL Right 07/25/2022   Procedure: INTRAMEDULLARY (IM) NAIL FEMORAL;  Surgeon: Yolonda Kida, MD;  Location: [facility] OR;  Service: Orthopedics;  Laterality: Right;  hana table,carm.synthesis   INTRAMEDULLARY (IM) NAIL INTERTROCHANTERIC Left 12/25/2019   Procedure: INTRAMEDULLARY (IM) NAIL INTERTROCHANTRIC;  Surgeon: Durene Romans, MD;  Location: [facility]S;  Service: Orthopedics;  Laterality: Left;  IR KYPHO THORACIC WITH BONE BIOPSY  09/15/2019   IR KYPHO THORACIC WITH BONE BIOPSY  07/13/2020       IR KYPHO THORACIC WITH BONE BIOPSY  07/13/2020   IR VERTEBROPLASTY CERV/THOR BX INC UNI/BIL INC/INJECT/IMAGING  11/10/2019   KYPHOPLASTY N/A 11/16/2020   Procedure: T11,  L1,KYPHOPLASTY;  Surgeon: Venita Lick, MD;  Location: [facility] OR;  Service: Orthopedics;  Laterality: N/A;   KYPHOSIS SURGERY  08/2007   cement used   LEFT HEART CATH AND CORONARY ANGIOGRAPHY N/A 02/23/2022   Procedure: LEFT HEART CATH AND CORONARY ANGIOGRAPHY;  Surgeon: Swaziland, Peter M, MD;  Location: [facility] INVASIVE CV  LAB;  Service: Cardiovascular;  Laterality: N/A;   LEFT HEART CATH AND CORONARY ANGIOGRAPHY N/A 02/26/2022   Procedure: LEFT HEART CATH AND CORONARY ANGIOGRAPHY;  Surgeon: Yvonne Kendall, MD;  Location: [facility] CV LAB;  Service: Cardiovascular;  Laterality: N/A;   OVARIAN CYST SURGERY     left   RADIOLOGY WITH ANESTHESIA N/A 11/10/2019   Procedure: VERTEBROPLASTY;  Surgeon: Julieanne Cotton, MD;  Location: [facility] OR;  Service: Radiology;  Laterality: N/A;   RADIOLOGY WITH ANESTHESIA N/A 10/04/2022   Procedure: T3 vertebroplasty;  Surgeon: Julieanne Cotton, MD;  Location: [facility] OR;  Service: Radiology;  Laterality: N/A;   TEMPORARY PACEMAKER N/A 02/26/2022   Procedure: TEMPORARY PACEMAKER;  Surgeon: Yvonne Kendall, MD;  Location: [facility] INV[facility] CV LAB;  Service: Cardiovascular;  Laterality: N/A;   TONSILLECTOMY     TOTAL KNEE ARTHROPLASTY  06/2010   left   TUBAL LIGATION       Home Medications:  Prior to Admission medications   Medication Sig Start Date End Date Taking? Authorizing Provider  acetaminophen (TYLENOL) 500 MG tablet Take 1,000 mg by mouth every 8 (eight) hours as needed (for pain).   Yes [provider]  alendronate (FOSAMAX) 70 MG tablet Take 1 tablet (70 mg total) by mouth once a week. Take with a full glass of water on an empty stomach. 12/19/21  Yes Paz, Nolon Rod, MD  apixaban (ELIQUIS) 2.5 MG TABS tablet Take 1 tablet (2.5 mg total) by mouth 2 (two) times daily. Patient taking differently: Take 2.5 mg by mouth daily. 02/28/22  Yes Laverda Page B, NP  clopidogrel (PLAVIX) 75 MG tablet Take 1 tablet (75 mg total) by mouth daily. 03/01/22  Yes Laverda Page B, NP  docusate sodium (COLACE) 100 MG capsule Take 1 capsule (100 mg total) by mouth 2 (two) times daily. 07/27/22  Yes Ghimire, Lyndel Safe, MD  lidocaine (LIDODERM) 5 % Place 1 patch onto the skin daily. Remove & Discard patch within 12 hours or as directed by MD 10/10/22  Yes Almon Hercules, MD  LORazepam (ATIVAN)  0.5 MG tablet Take 0.5 mg by mouth 2 (two) times daily.   Yes [provider]  nitroGLYCERIN (NITROSTAT) 0.4 MG SL tablet Place 1 tablet (0.4 mg total) under the tongue every 5 (five) minutes x 3 doses as needed for chest pain. 02/28/22  Yes Arty Baumgartner, NP  Nutritional Supplement LIQD Take 120 mLs by mouth in the morning, at noon, and at bedtime. Med Pass   Yes [provider]  Nutritional Supplements (NUTRITIONAL SUPPLEMENT PO) Take by mouth See admin instructions. Twice a day with lunch and dinner(per MAR)   Yes [provider]  pantoprazole (PROTONIX) 40 MG tablet Take 1 tablet (40 mg total) by mouth at bedtime. Patient taking differently: Take 40 mg by mouth daily. 02/28/22  Yes Arty Baumgartner, NP  polyethylene glycol (MIRALAX / GLYCOLAX) 17 g packet Take 17 g by mouth daily. 07/27/22  Yes Dorcas Carrow, MD  rosuvastatin (CRESTOR) 20 MG tablet Take 1 tablet (20 mg total) by mouth daily. Patient taking differently: Take 20 mg by mouth at bedtime. 03/01/22  Yes Laverda Page B, NP  sertraline (ZOLOFT) 25 MG tablet Take 25 mg by mouth at bedtime. 06/18/22  Yes [provider]  amiodarone (PACERONE) 200 MG tablet Take 200 mg by mouth daily. Patient not taking: Reported on 01/03/2023 06/18/22   [provider]  amLODipine (NORVASC) 5 MG tablet Take 1 tablet (5 mg total) by mouth daily. Patient not taking: Reported on 01/03/2023 12/19/21   Wanda Plump, MD  calcium carbonate (TUMS - DOSED IN MG ELEMENTAL CALCIUM) 500 MG chewable tablet Chew 1 tablet (200 mg of elemental calcium total) by mouth 3 (three) times daily as needed for indigestion or heartburn. Patient not taking: Reported on 01/03/2023 07/28/22   Dorcas Carrow, MD  metoprolol tartrate (LOPRESSOR) 50 MG tablet Take 1 tablet (50 mg total) by mouth 2 (two) times daily. Patient not taking: Reported on 01/03/2023 12/19/21   Wanda Plump, MD  ondansetron (ZOFRAN-ODT) 4 MG disintegrating tablet Take 4  mg by mouth every 8 (eight) hours as needed. Patient not taking: Reported on 01/03/2023 08/24/22   [provider]  oxyCODONE (ROXICODONE) 5 MG immediate release tablet Take 1-2 tablets (5-10 mg total) by mouth every 4 (four) hours as needed for severe pain. Patient not taking: Reported on 01/03/2023 10/30/22   Mesner, Barbara Cower, MD    Inpatient Medications: Scheduled Meds:  Continuous Infusions:  PRN Meds:   Allergies:    Allergies  Allergen Reactions   Penicillins Hives, Rash and Other (See Comments)    Has patient had a PCN reaction causing immediate rash, facial/tongue/throat swelling, SOB or lightheadedness with hypotension: Yes Has patient had a PCN reaction causing severe rash involving mucus membranes or skin necrosis: yes Has patient had a PCN reaction that required hospitalization: No Has patient had a PCN reaction occurring within the last 10 years: no If all of the above answers are "NO", then may proceed with Cephalosporin use.  Other Reaction: OTHER REACTION   Valsartan Itching and Other (See Comments)   Atorvastatin Diarrhea   Carvedilol Other (See Comments)    "teeth hurt when I took it"    Ezetimibe Nausea Only   Fluvastatin Sodium Other (See Comments)    "Can't remember"   Magnesium Hydroxide Other (See Comments)    REACTION: triggers HAs   Meloxicam Other (See Comments)    Pt seeing auro's / spots.     Pneumovax [Pneumococcal Polysaccharide Vaccine] Other (See Comments)    Local reaction   Quinapril Hcl Other (See Comments)     "feelings of tiredness"   Simvastatin Diarrhea   Topamax [Topiramate] Itching   Vit D-Vit E-Safflower Oil Other (See Comments)    Headaches    Social History:   Social History   Socioeconomic History   Marital status: Widowed    Spouse name: Not on file   Number of children: 3   Years of education: Not on file   Highest education level: Not on file  Occupational History   Occupation: n/a  Tobacco Use   Smoking  status: Some Days    Current packs/day: 1.00    Average packs/day: 1 pack/day for 71.5 years (71.5 ttl pk-yrs)    Types: Cigarettes    Start date: 06/26/1951  Smokeless tobacco: Never   Tobacco comments:    quit 25 years ago  Vaping Use   Vaping status: Never Used  Substance and Sexual Activity   Alcohol use: Not Currently    Alcohol/week: 1.0 standard drink of alcohol    Types: 1 Glasses of wine per week   Drug use: No   Sexual activity: Not Currently  Other Topics Concern   Not on file  Social History Narrative   Lost husband 07/29/07-    Lives in [place]   has 3 daughters, lost Vernona Rieger (03-2017)   Moved to ALF 04-2021   [name] lives in [place], [place] ( [place])     Not driving as of [phone]            Social Determinants of Health   Financial Resource Strain: Low Risk  (08/10/2021)   Overall Financial Resource Strain (CARDIA)    Difficulty of Paying Living Expenses: Not hard at all  Food Insecurity: No Food Insecurity (07/24/2022)   Hunger Vital Sign    Worried About Running Out of Food in the Last Year: Never true    Ran Out of Food in the Last Year: Never true  Transportation Needs: No Transportation Needs (07/24/2022)   PRAPARE - Administrator, Civil Service (Medical): No    Lack of Transportation (Non-Medical): No  Physical Activity: Not on file  Stress: Not on file  Social Connections: Unknown (10/03/2021)   [name]   Social Network    Social Network: Not on file  Intimate Partner Violence: Not At Risk (07/24/2022)   Humiliation, Afraid, Rape, and Kick questionnaire    Fear of Current or Ex-Partner: No    Emotionally Abused: No    Physically Abused: No    Sexually Abused: No    Family History:    Family History  Problem Relation Age of Onset   Breast cancer Mother    Heart disease Father        MIs   Colon cancer Other        cousin   Breast cancer Sister    Leukemia Brother        GM     ROS:  Constitutional: Denied  fever, chills, malaise, night sweats Eyes: Denied vision change or loss Ears/Nose/Mouth/Throat: Denied ear ache, sore throat, coughing, sinus pain Cardiovascular: see HPI  Respiratory: see HPI  Gastrointestinal: Denied nausea, vomiting, abdominal pain, diarrhea Genital/Urinary: Denied dysuria, hematuria, urinary frequency/urgency Musculoskeletal: Denied muscle ache, joint pain, weakness Skin: Denied rash, wound Neuro: Denied headache, dizziness, syncope Psych: Denied history of depression/anxiety  Endocrine: Denied history of diabetes   Physical Exam/Data:   Vitals:   01/03/23 1131  BP: 116/70  Pulse: 83  Resp: 16  Temp: 97.7 F (36.5 C)  TempSrc: Oral  SpO2: 94%   No intake or output data in the 24 hours ending 01/03/23 1326    10/30/2022    3:51 AM 10/04/2022    4:04 PM 07/25/2022    2:25 PM  Last 3 Weights  Weight (lbs) 110 lb 127 lb 13.9 oz 125 lb 10.6 oz  Weight (kg) 49.896 kg 58 kg 57 kg     There is no height or weight on file to calculate BMI.   Vitals:  Vitals:   01/03/23 1131  BP: 116/70  Pulse: 83  Resp: 16  Temp: 97.7 F (36.5 C)  SpO2: 94%   General Appearance: In no apparent distress, frail elderly  HEENT: Normocephalic, atraumatic.  Neck: Supple, trachea midline, no JVDs Cardiovascular: Regular rate and rhythm, normal S1-S2,  grade II systolic murmur at LUSB Respiratory: Resting breathing unlabored, lungs sounds clear to auscultation bilaterally, no use of accessory muscles. On room air.  No wheezes, rales or rhonchi.   Gastrointestinal: Bowel sounds positive, abdomen soft, non-tender Extremities: Able to move all extremities in bed without difficulty, no edema of BLE Musculoskeletal: Mild generalized muscular atrophy  Skin: Intact, warm, dry. Petechiae noted of BLE  Neurologic: Alert, oriented to person, place and time, No cognitive deficit,  no gross focal neuro deficit Psychiatric: Normal affect. Mood is appropriate.     EKG:  The EKG was  personally reviewed and demonstrates:    EKG today showed sinus rhythm 76bpm, non specific T wave flattening, no acute ischemic change comparing to EKG on 10/30/22   Telemetry:  Telemetry was personally reviewed and demonstrates:    Sinus rhythm , artifacts   Relevant CV Studies:   Echo from 09/30/22:   1. No sig LVOT obstruction. Basal/septal hypertrophy can be seen with old  age. Unchanged hypokinesis of the inferior/inferolateral/inferoseptal  base-mid wall. Left ventricular ejection fraction, by estimation, is 50 to  55%. The left ventricle has low  normal function. There is severe asymmetric left ventricular hypertrophy  of the basal-septal segment. Left ventricular diastolic parameters are  consistent with Grade I diastolic dysfunction (impaired relaxation).   2. No evidence of mitral valve regurgitation.   3. There is moderate calcification of the aortic valve. Aortic valve  regurgitation is not visualized. Mild aortic valve stenosis.   4. The inferior vena cava is normal in size with greater than 50%  respiratory variability, suggesting right atrial pressure of 3 mmHg.   Comparison(s): No significant change from prior study.   Laboratory Data:  High Sensitivity Troponin:   Recent Labs  Lab 01/03/23 1154  TROPONINIHS 13     Chemistry Recent Labs  Lab 01/03/23 1154  NA 137  K 3.7  CL 98  CO2 28  GLUCOSE 89  BUN 8  CREATININE 0.70  CALCIUM 9.3  GFRNONAA >60  ANIONGAP 11    No results for input(s): "PROT", "ALBUMIN", "AST", "ALT", "ALKPHOS", "BILITOT" in the last 168 hours. Lipids No results for input(s): "CHOL", "TRIG", "HDL", "LABVLDL", "LDLCALC", "CHOLHDL" in the last 168 hours.  Hematology Recent Labs  Lab 01/03/23 1154  WBC 6.6  RBC 4.88  HGB 12.4  HCT 41.5  MCV 85.0  MCH 25.4*  MCHC 29.9*  RDW 17.2*  PLT 387   Thyroid No results for input(s): "TSH", "FREET4" in the last 168 hours.  BNPNo results for input(s): "BNP", "PROBNP" in the last 168  hours.  DDimer No results for input(s): "DDIMER" in the last 168 hours.   Radiology/Studies:  DG Chest Portable 1 View  Result Date: 01/03/2023 CLINICAL DATA:  Chest pain. EXAM: PORTABLE CHEST 1 VIEW COMPARISON:  Oct 05, 2022. FINDINGS: Stable cardiomediastinal silhouette. Old left rib fractures are noted. Status post kyphoplasty at multiple levels of the thoracic spine. Mild bibasilar subsegmental atelectasis or infiltrates are noted. IMPRESSION: Mild bibasilar subsegmental atelectasis or infiltrates are noted. Electronically Signed   By: Lupita Raider M.D.   On: 01/03/2023 12:36     Assessment and Plan:   Chest pain CAD with anterior STEMI 02/26/2022 s/p PCI/DES to RCA - presented with resting chest pain started last night, positive relief from nitroglycerin  - Hs trop negative x2  - EKG no acute findings  - LHC from  02/2022 showed single-vessel obstructive CAD with severe ostial RCA stenosis treated with staged atherectomy and intracoronary lithotripsy with placement of DES; noted 45% LM, 30% mid LAD, 30% distal LAD, 30% ostial to proximal circumflex, 20% ramus -Recent echo from 09/30/2022 with LVEF 50 to 55% (improved from LVEF 45 to 50% on 02/24/2022), unchanged hypokinesis of inferior/inferolateral/inferoseptal base- mid wall, severe asymmetric LVH, grade 1 DD, mild aortic stenosis. -Certainly possible having angina in the setting of poorly controlled hypertension, she is not interested for any invasive ischemic evaluation, given negative Trop and EKG, ACS has been ruled out, would aim for conservative management with medical therapy, she is agreeable  -Medical therapy: Continue PTA Plavix 75 mg daily, metoprolol 50 twice daily, Crestor 20 milligram daily, amlodipine 5 mg daily, will add isosorbide 30 mg daily - if continue to have symptoms, would arrange outpatient stress Myoview if patient wishes  - message sent to Dr Dulce Sellar office for arranging appt in 1-2 weeks   Hypertensive  urgency  -Blood pressure up to 200/102 at ED -Please resume home medication metoprolol 50 twice daily, will increase amlodipine from 5 to 10 mg daily, and add isosorbide 30 mg daily     Risk Assessment/Risk Scores:    For questions or updates, please contact [facility] Please consult [email or web address] for contact info under    Signed, Cyndi Bender, NP  01/03/2023 1:26 PM

## 2023-01-03 NOTE — ED Notes (Signed)
Pt facility informed pt is coming back to them right now. Ptar given report and is bringing pt home now

## 2023-01-03 NOTE — ED Triage Notes (Signed)
Pt BIB [facility] from [facility] for CP/pressure.  Began last night, received 1 nitroglycerin and pressure improved. Chest pressure returned this morning, radiating to left neck.  Facility gave 3 nitroglycerin, only has a small amount of pressure at this time.   138/62 96% HR 84

## 2023-01-03 NOTE — ED Provider Notes (Signed)
[place] EMERGENCY DEPARTMENT AT [facility] Provider Note   CSN: [id number] Arrival date & time: 01/03/23  1119     History  No chief complaint on file.   [name] is a 89 y.o. female.  HPI 89 year old female with a history of CAD presents with chest pain/pressure.  Originally started last night.  She was given nitroglycerin at her SNF.  Felt better and then this morning the pain recurred around 9 AM.  Feels like a heaviness/pain.  Got a few more nitroglycerin this morning and now has only some residual minimal pressure/discomfort.  She states she always feels short of breath but was more short of breath when this happened.  She also felt some anterior neck discomfort.  No abdominal pain.  This does remind her of last year when she had a stent placed.  Home Medications Prior to Admission medications   Medication Sig Start Date End Date Taking? Authorizing Provider  acetaminophen (TYLENOL) 500 MG tablet Take 1,000 mg by mouth every 8 (eight) hours as needed (for pain).   Yes [provider]  alendronate (FOSAMAX) 70 MG tablet Take 1 tablet (70 mg total) by mouth once a week. Take with a full glass of water on an empty stomach. 12/19/21  Yes Paz, Nolon Rod, MD  apixaban (ELIQUIS) 2.5 MG TABS tablet Take 1 tablet (2.5 mg total) by mouth 2 (two) times daily. Patient taking differently: Take 2.5 mg by mouth daily. 02/28/22  Yes Laverda Page B, NP  clopidogrel (PLAVIX) 75 MG tablet Take 1 tablet (75 mg total) by mouth daily. 03/01/22  Yes Laverda Page B, NP  docusate sodium (COLACE) 100 MG capsule Take 1 capsule (100 mg total) by mouth 2 (two) times daily. 07/27/22  Yes Ghimire, Lyndel Safe, MD  lidocaine (LIDODERM) 5 % Place 1 patch onto the skin daily. Remove & Discard patch within 12 hours or as directed by MD 10/10/22  Yes Almon Hercules, MD  LORazepam (ATIVAN) 0.5 MG tablet Take 0.5 mg by mouth 2 (two) times daily.   Yes [provider]  nitroGLYCERIN  (NITROSTAT) 0.4 MG SL tablet Place 1 tablet (0.4 mg total) under the tongue every 5 (five) minutes x 3 doses as needed for chest pain. 02/28/22  Yes Arty Baumgartner, NP  Nutritional Supplement LIQD Take 120 mLs by mouth in the morning, at noon, and at bedtime. Med Pass   Yes [provider]  Nutritional Supplements (NUTRITIONAL SUPPLEMENT PO) Take by mouth See admin instructions. Twice a day with lunch and dinner(per MAR)   Yes [provider]  pantoprazole (PROTONIX) 40 MG tablet Take 1 tablet (40 mg total) by mouth at bedtime. Patient taking differently: Take 40 mg by mouth daily. 02/28/22  Yes Laverda Page B, NP  polyethylene glycol (MIRALAX / GLYCOLAX) 17 g packet Take 17 g by mouth daily. 07/27/22  Yes Dorcas Carrow, MD  rosuvastatin (CRESTOR) 20 MG tablet Take 1 tablet (20 mg total) by mouth daily. Patient taking differently: Take 20 mg by mouth at bedtime. 03/01/22  Yes Laverda Page B, NP  sertraline (ZOLOFT) 25 MG tablet Take 25 mg by mouth at bedtime. 06/18/22  Yes [provider]  amiodarone (PACERONE) 200 MG tablet Take 200 mg by mouth daily. Patient not taking: Reported on 01/03/2023 06/18/22   [provider]  amLODipine (NORVASC) 5 MG tablet Take 1 tablet (5 mg total) by mouth daily. Patient not taking: Reported on 01/03/2023 12/19/21   Wanda Plump,  MD  calcium carbonate (TUMS - DOSED IN MG ELEMENTAL CALCIUM) 500 MG chewable tablet Chew 1 tablet (200 mg of elemental calcium total) by mouth 3 (three) times daily as needed for indigestion or heartburn. Patient not taking: Reported on 01/03/2023 07/28/22   Dorcas Carrow, MD  metoprolol tartrate (LOPRESSOR) 50 MG tablet Take 1 tablet (50 mg total) by mouth 2 (two) times daily. Patient not taking: Reported on 01/03/2023 12/19/21   Wanda Plump, MD  ondansetron (ZOFRAN-ODT) 4 MG disintegrating tablet Take 4 mg by mouth every 8 (eight) hours as needed. Patient not taking: Reported on 01/03/2023 08/24/22    [provider]  oxyCODONE (ROXICODONE) 5 MG immediate release tablet Take 1-2 tablets (5-10 mg total) by mouth every 4 (four) hours as needed for severe pain. Patient not taking: Reported on 01/03/2023 10/30/22   Mesner, Barbara Cower, MD      Allergies    Penicillins, Valsartan, Atorvastatin, Carvedilol, Ezetimibe, Fluvastatin sodium, Magnesium hydroxide, Meloxicam, Pneumovax [pneumococcal polysaccharide vaccine], Quinapril hcl, Simvastatin, Topamax [topiramate], and Vit d-vit e-safflower oil    Review of Systems   Review of Systems  Constitutional:  Negative for diaphoresis.  Respiratory:  Positive for shortness of breath.   Cardiovascular:  Positive for chest pain. Negative for leg swelling.  Gastrointestinal:  Negative for abdominal pain.    Physical Exam Updated Vital Signs BP (!) 179/87   Pulse 77   Temp 97.7 F (36.5 C) (Oral)   Resp (!) 27   SpO2 94%  Physical Exam Vitals and nursing note reviewed.  Constitutional:      General: She is not in acute distress.    Appearance: She is well-developed. She is not ill-appearing or diaphoretic.  HENT:     Head: Normocephalic and atraumatic.  Cardiovascular:     Rate and Rhythm: Normal rate and regular rhythm.     Pulses:          Radial pulses are 2+ on the right side and 2+ on the left side.     Heart sounds: Normal heart sounds.  Pulmonary:     Effort: Pulmonary effort is normal.     Breath sounds: Normal breath sounds.  Abdominal:     Palpations: Abdomen is soft.     Tenderness: There is no abdominal tenderness.  Musculoskeletal:     Right lower leg: No edema.     Left lower leg: No edema.  Skin:    General: Skin is warm and dry.  Neurological:     Mental Status: She is alert.     ED Results / Procedures / Treatments   Labs (all labs ordered are listed, but only abnormal results are displayed) Labs Reviewed  CBC WITH DIFFERENTIAL/PLATELET - Abnormal; Notable for the following components:      Result Value    MCH 25.4 (*)    MCHC 29.9 (*)    RDW 17.2 (*)    All other components within normal limits  BASIC METABOLIC PANEL  TROPONIN I (HIGH SENSITIVITY)  TROPONIN I (HIGH SENSITIVITY)    EKG EKG Interpretation Date/Time:  Thursday January 03 2023 11:28:06 EDT Ventricular Rate:  76 PR Interval:  201 QRS Duration:  85 QT Interval:  411 QTC Calculation: 463 R Axis:   -7  Text Interpretation: Sinus rhythm Probable left atrial enlargement Anteroseptal infarct, age indeterminate nonspecific T waves diffusely are similar to June 2024 Confirmed by Pricilla Loveless [phone]) on 01/03/2023 11:29:39 AM  Radiology DG Chest Portable 1 View  Result Date: 01/03/2023  CLINICAL DATA:  Chest pain. EXAM: PORTABLE CHEST 1 VIEW COMPARISON:  Oct 05, 2022. FINDINGS: Stable cardiomediastinal silhouette. Old left rib fractures are noted. Status post kyphoplasty at multiple levels of the thoracic spine. Mild bibasilar subsegmental atelectasis or infiltrates are noted. IMPRESSION: Mild bibasilar subsegmental atelectasis or infiltrates are noted. Electronically Signed   By: Lupita Raider M.D.   On: 01/03/2023 12:36    Procedures Procedures    Medications Ordered in ED Medications  amLODipine (NORVASC) tablet 10 mg (has no administration in time range)  isosorbide mononitrate (IMDUR) 24 hr tablet 30 mg (has no administration in time range)  aspirin chewable tablet 324 mg (324 mg Oral Given 01/03/23 1209)    ED Course/ Medical Decision Making/ A&P                                 Medical Decision Making Amount and/or Complexity of Data Reviewed Labs: ordered.    Details: Troponin normal x 2 Radiology: ordered and independent interpretation performed.    Details: No CHF ECG/medicine tests: ordered and independent interpretation performed.    Details: No ischemia  Risk OTC drugs. Prescription drug management.   Patient presents with chest pain. Is gone and has not recurred in ED. Troponins normal and flat.  Has been hypertensive, though at this time doesn't seem symptomatic. Given her cardiac history, Cardiology consulted. Discussed with Dr. Rennis Golden, who has seen patient.  They will prescribe Imdur but feel she is stable for discharge.  They will also give her a dose of blood pressure medicine while she is here as her blood pressure is currently 170/80.  Given her current well appearance, no current symptoms, I think hypertensive emergency, especially stroke or aortic dissection is pretty unlikely.  Will discharge back to her facility with return precautions.        Final Clinical Impression(s) / ED Diagnoses Final diagnoses:  Nonspecific chest pain  Hypertensive urgency    Rx / DC Orders ED Discharge Orders          Ordered    Ambulatory referral to Cardiology       Comments: If you have not heard from the Cardiology office within the next 72 hours please call [phone].   01/03/23 1520              Pricilla Loveless, MD 01/03/23 1525

## 2023-01-04 ENCOUNTER — Telehealth: Payer: Self-pay | Admitting: Cardiology

## 2023-01-04 NOTE — Telephone Encounter (Signed)
-----   Message from Nurse Ladonna Snide F sent at 01/03/2023  3:37 PM EDT ----- Regarding: FW: appt  ----- Message ----- From: Cyndi Bender, NP Sent: 01/03/2023   3:09 PM EDT To: Mickie Bail Ash/[facility] Scheduling Subject: appt                                           Can you please get her in with Dr Dulce Sellar in 1-2 weeks thanks

## 2023-01-04 NOTE — Telephone Encounter (Signed)
Called patient and talked to daughter, she does not want to come to Dr. Hulen Shouts office in [place] to be seen as she is 89 years old. I offered other providers in [place] who would see her and she said she would talk to her mother and see what she would like to do and call us back./kbl 01/04/23

## 2023-01-05 DIAGNOSIS — R079 Chest pain, unspecified: Secondary | ICD-10-CM | POA: Diagnosis not present

## 2023-01-05 DIAGNOSIS — R0989 Other specified symptoms and signs involving the circulatory and respiratory systems: Secondary | ICD-10-CM | POA: Diagnosis not present

## 2023-01-07 ENCOUNTER — Telehealth: Payer: Self-pay | Admitting: Cardiology

## 2023-01-07 DIAGNOSIS — I480 Paroxysmal atrial fibrillation: Secondary | ICD-10-CM | POA: Diagnosis not present

## 2023-01-07 DIAGNOSIS — R0789 Other chest pain: Secondary | ICD-10-CM | POA: Diagnosis not present

## 2023-01-07 DIAGNOSIS — I10 Essential (primary) hypertension: Secondary | ICD-10-CM | POA: Diagnosis not present

## 2023-01-07 NOTE — Telephone Encounter (Signed)
Left message for pt to call back to schedule f/u with Dr.  Dulce Sellar.

## 2023-01-07 NOTE — Telephone Encounter (Signed)
-----   Message from Nurse Ladonna Snide F sent at 01/03/2023  3:37 PM EDT ----- Regarding: FW: appt  ----- Message ----- From: Cyndi Bender, NP Sent: 01/03/2023   3:09 PM EDT To: Mickie Bail Ash/[facility] Scheduling Subject: appt                                           Can you please get her in with Dr Dulce Sellar in 1-2 weeks thanks

## 2023-01-10 DIAGNOSIS — S22000B Wedge compression fracture of unspecified thoracic vertebra, initial encounter for open fracture: Secondary | ICD-10-CM | POA: Diagnosis not present

## 2023-01-10 DIAGNOSIS — S12000A Unspecified displaced fracture of first cervical vertebra, initial encounter for closed fracture: Secondary | ICD-10-CM | POA: Diagnosis not present

## 2023-01-10 DIAGNOSIS — I480 Paroxysmal atrial fibrillation: Secondary | ICD-10-CM | POA: Diagnosis not present

## 2023-01-10 DIAGNOSIS — M6281 Muscle weakness (generalized): Secondary | ICD-10-CM | POA: Diagnosis not present

## 2023-01-11 DIAGNOSIS — L089 Local infection of the skin and subcutaneous tissue, unspecified: Secondary | ICD-10-CM | POA: Diagnosis not present

## 2023-01-14 DIAGNOSIS — I480 Paroxysmal atrial fibrillation: Secondary | ICD-10-CM | POA: Diagnosis not present

## 2023-01-14 DIAGNOSIS — R0789 Other chest pain: Secondary | ICD-10-CM | POA: Diagnosis not present

## 2023-01-14 DIAGNOSIS — M6281 Muscle weakness (generalized): Secondary | ICD-10-CM | POA: Diagnosis not present

## 2023-01-15 ENCOUNTER — Telehealth: Payer: Self-pay

## 2023-01-15 NOTE — Telephone Encounter (Signed)
Transition Care Management Follow-up Telephone Call Date of discharge and from where:  How have you been since you were released from the hospital? Doing ok and following up with providers. Pt lives on a SNF  Any questions or concerns? Yes Concerns of misdiagnosed with heart care, which was in fact a hernia   Items Reviewed: Did the pt receive and understand the discharge instructions provided? Yes  Medications obtained and verified? Yes  Other? No  Any new allergies since your discharge? No  Dietary orders reviewed? No Do you have support at home? Yes  SNF    Follow up appointments reviewed:  PCP Hospital f/u appt confirmed? Yes  Scheduled to see  on  @ . SNF Specialist Hospital f/u appt confirmed? Yes  Scheduled to see  on  @ . SNF Are transportation arrangements needed? No  If their condition worsens, is the pt aware to call PCP or go to the Emergency Dept.? Yes Was the patient provided with contact information for the PCP's office or ED? Yes Was to pt encouraged to call back with questions or concerns? Yes

## 2023-01-18 IMAGING — CT CT T SPINE W/O CM
3 of 4 series · 9 of 33 positions shown, 11 images · non-contrast
Comparison: Lumbar spine CT 11/04/2019

CLINICAL DATA: Back pain with compression fracture

EXAM:
CT THORACIC AND LUMBAR SPINE WITHOUT CONTRAST
TECHNIQUE: Multidetector CT imaging of the thoracic and lumbar spine was
performed without contrast. Multiplanar CT image reconstructions
were also generated.

[Series 7: sag bone · sagittal · 0.45mm/px · 5 of 79 slices shown, 6 images]
[im 27/79  bone]
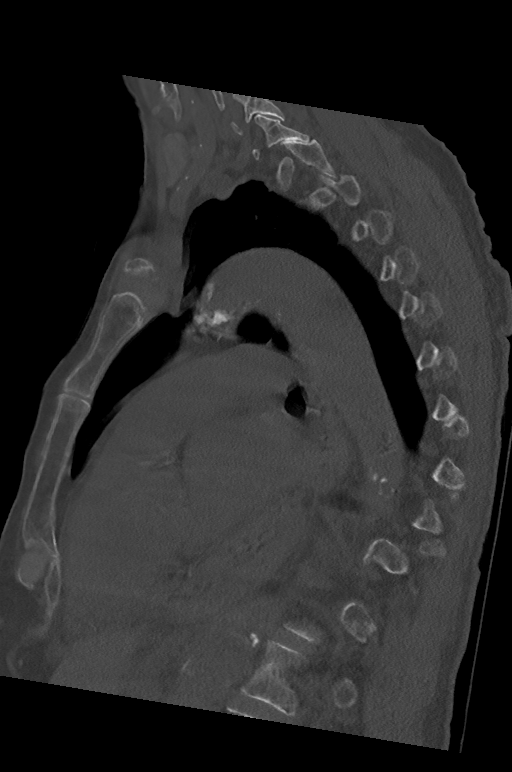
[im 33/79  bone]
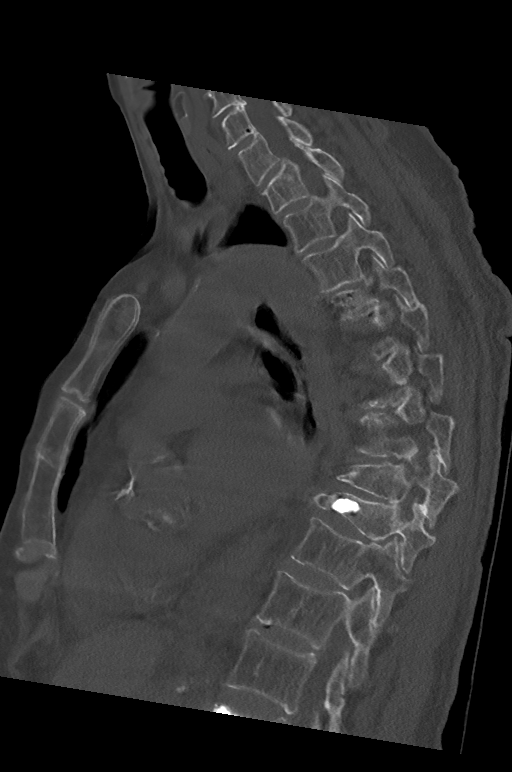
[im 40/79  soft-tissue]
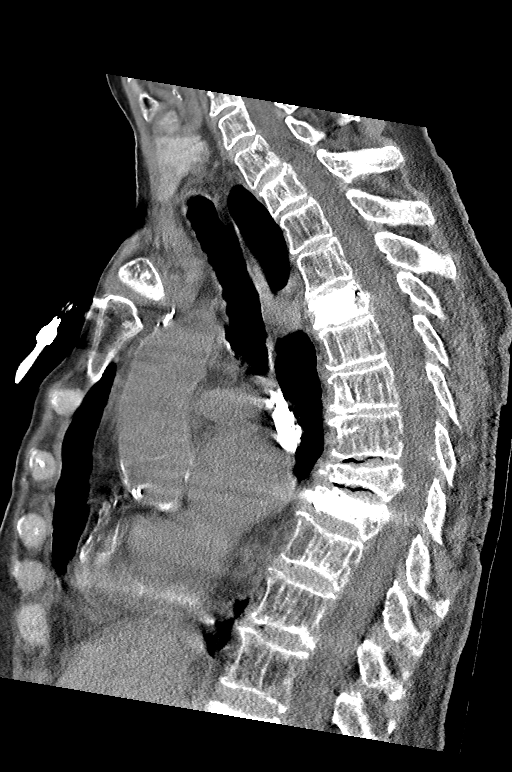
[im 40/79  bone]
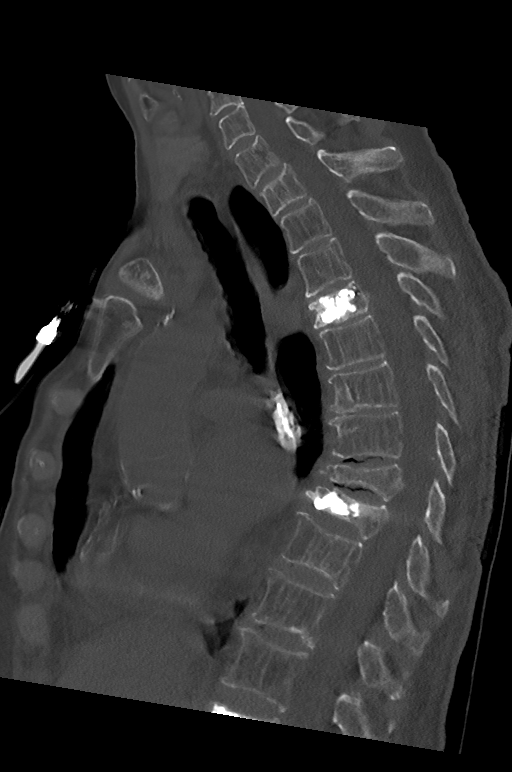
[im 46/79  bone]
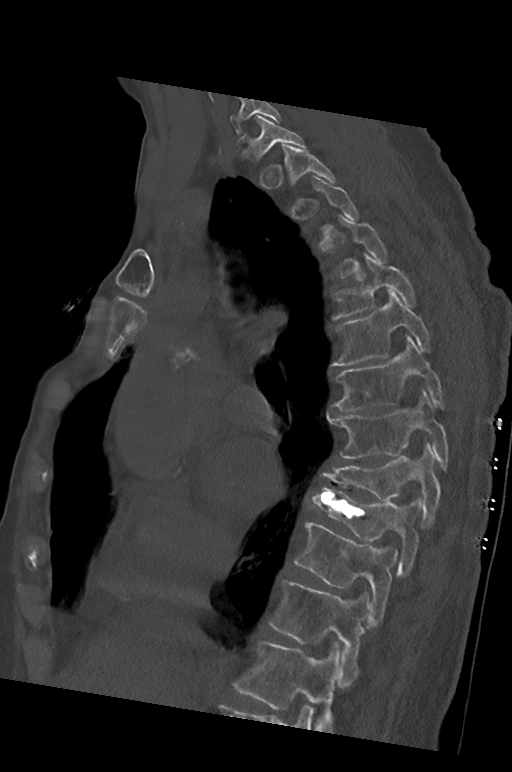
[im 53/79  bone]
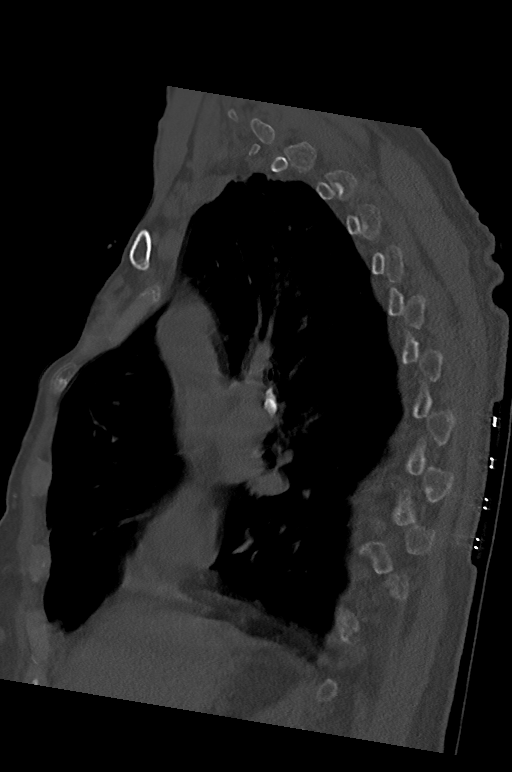

[Series 8: cor bone · coronal · 0.26mm/px · 3 of 89 slices shown]
[im 18/89  bone]
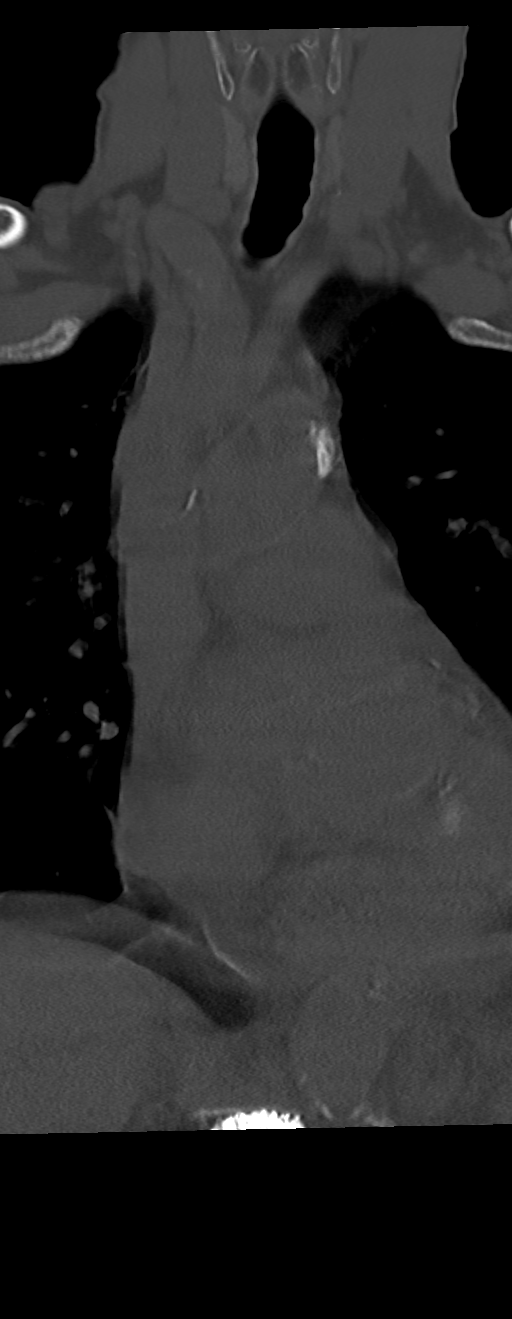
[im 36/89  bone]
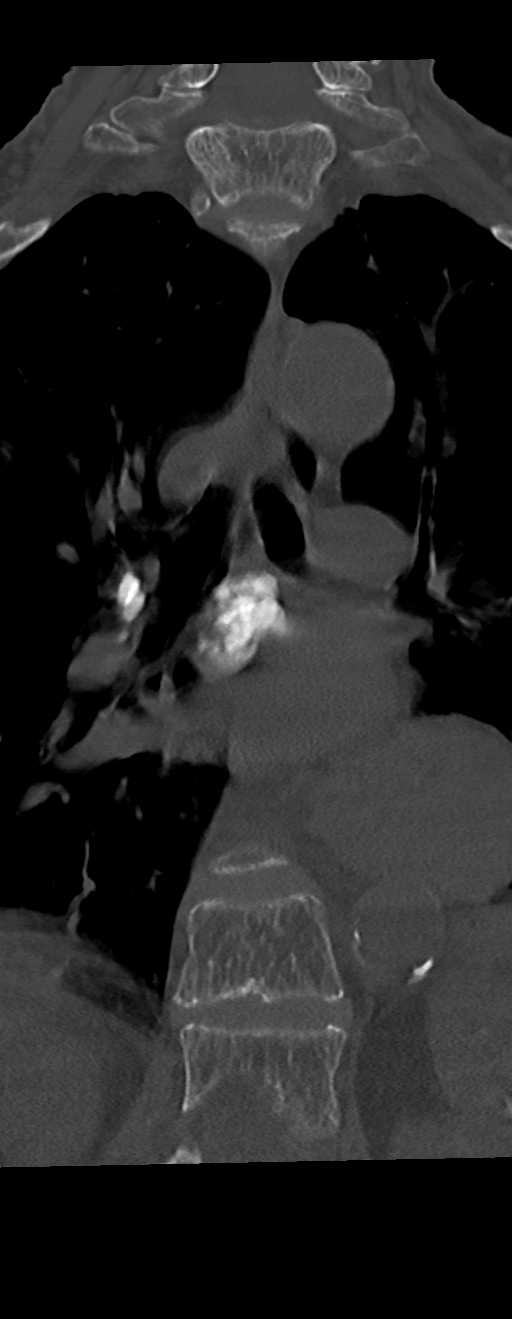
[im 53/89  bone]
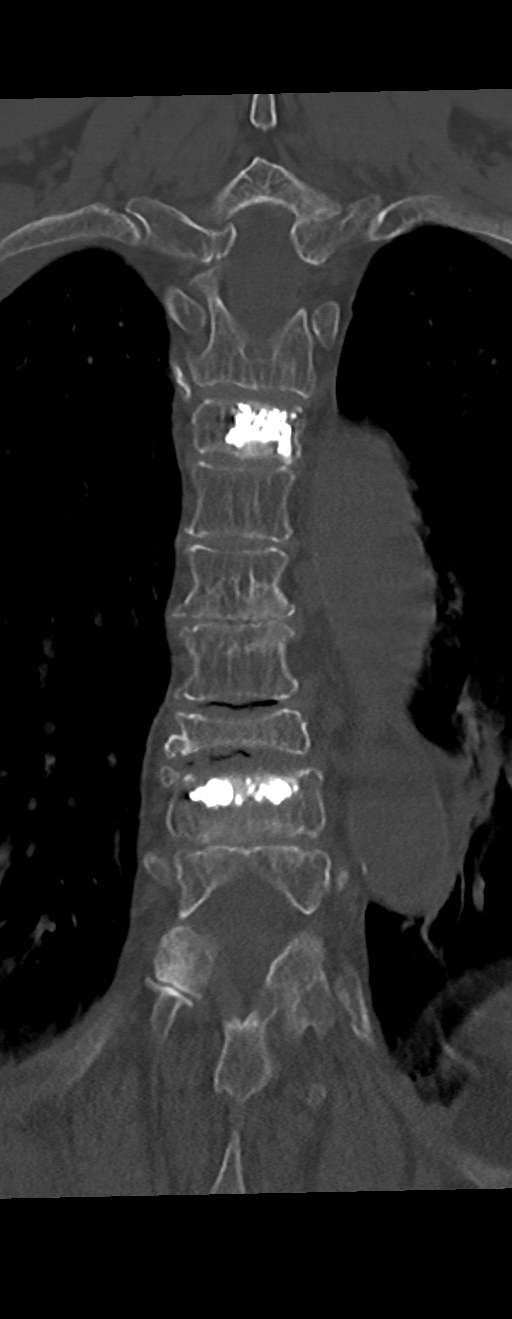

[Series 9: t spine bone · axial · 0.28mm/px · z∈[-360,-360]mm · 1 of 153 slices shown, 2 images]
[im 77/153  soft-tissue]
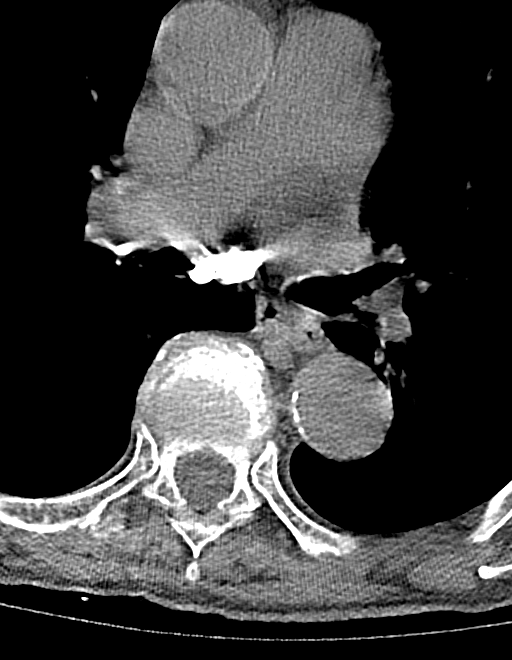
[im 77/153  bone]
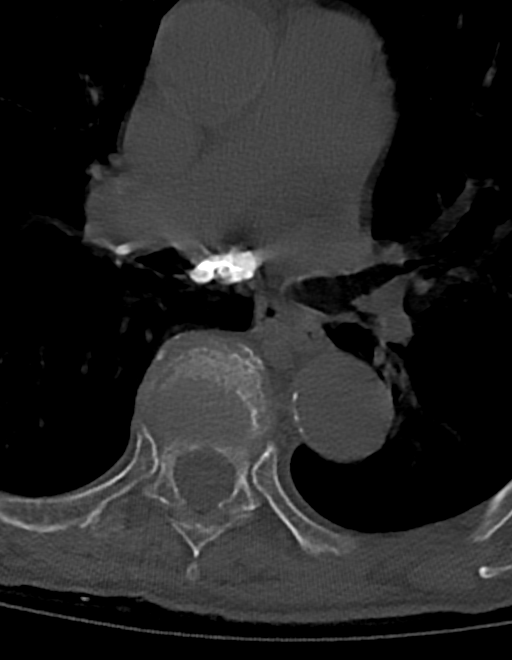

[9 of 33 positions shown; findings below may reference images not displayed]

FINDINGS: CT THORACIC SPINE FINDINGS

Alignment: Increased thoracic kyphosis.  No static subluxation.

Vertebrae: There augmented compression fractures at T5 and T10 with
a new compression fracture of T8. At T9, there is approximately 50%
height loss with 4 mm of retropulsion. Height loss at T5 and T10 is
unchanged.

Paraspinal and other soft tissues: Calcific aortic atherosclerosis.
Hiatal hernia. Calcified mediastinal and right hilar lymph nodes.

Disc levels: There is mild spinal canal narrowing at the T9 level
due to retropulsion. Otherwise, spinal canal patency is preserved.

CT LUMBAR SPINE FINDINGS

Segmentation: There are conflicting numbering schemes on prior
studies. There are 5 non-rib-bearing lumbar vertebral bodies. The
augmented level is considered L2.

Alignment: Grade 1 L5-S1 anterolisthesis is unchanged.

Vertebrae: Prior vertebral augmentation at L2 without progressive
height loss. Unchanged chronic height loss at L3 and L4. No acute
fracture.

Paraspinal and other soft tissues: Calcific aortic atherosclerosis.

Disc levels:

L1-2: Unremarkable.

L2-3: Mild spinal canal narrowing, unchanged.

L3-4: Unremarkable.

L4-5: Disc space narrowing without high-grade stenosis.

L5-S1: Facet hypertrophy with grade 1 anterolisthesis. No high-grade
stenosis.
IMPRESSION: 1. New compression fracture of T9 with approximately 50% height loss
and 4 mm of retropulsion. Mild spinal canal narrowing.
2. Unchanged appearance of augmented compression fractures at T5 and
T10.
3. Prior vertebral augmentation at L2 without progressive height
loss.
4. Unchanged chronic height loss at L3 and L4.

Aortic Atherosclerosis (BV29J-AHW.W).

## 2023-01-18 IMAGING — CT CT L SPINE W/O CM
3 of 5 series · 15 of 33 positions shown, 18 images · non-contrast
Comparison: Lumbar spine CT 11/04/2019

CLINICAL DATA: Back pain with compression fracture

EXAM:
CT THORACIC AND LUMBAR SPINE WITHOUT CONTRAST
TECHNIQUE: Multidetector CT imaging of the thoracic and lumbar spine was
performed without contrast. Multiplanar CT image reconstructions
were also generated.

[Series 7: l spine soft · axial · 0.29mm/px · z∈[-621,-467]mm · 9 of 93 slices shown, 12 images]
[im 8/93  soft-tissue]
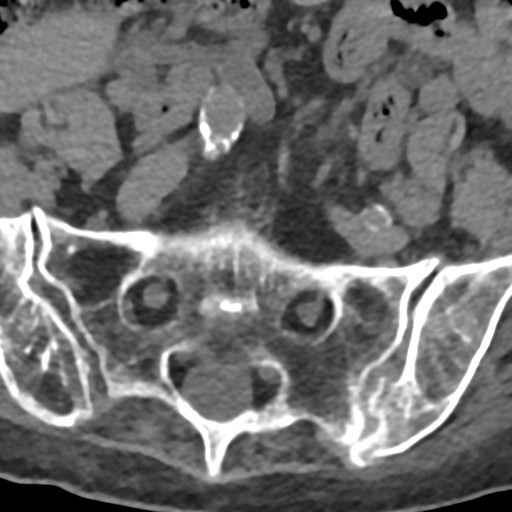
[im 8/93  bone]
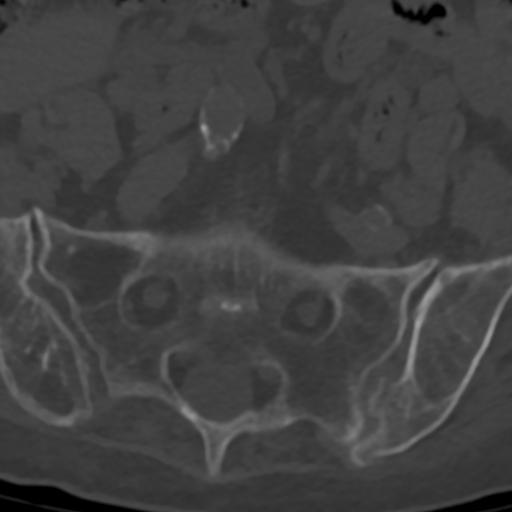
[im 22/93  bone]
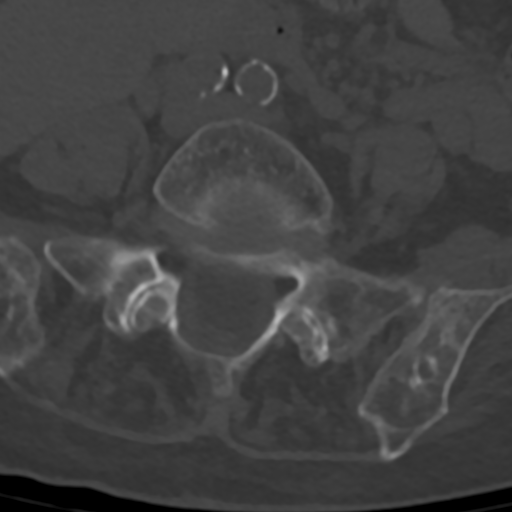
[im 29/93  bone]
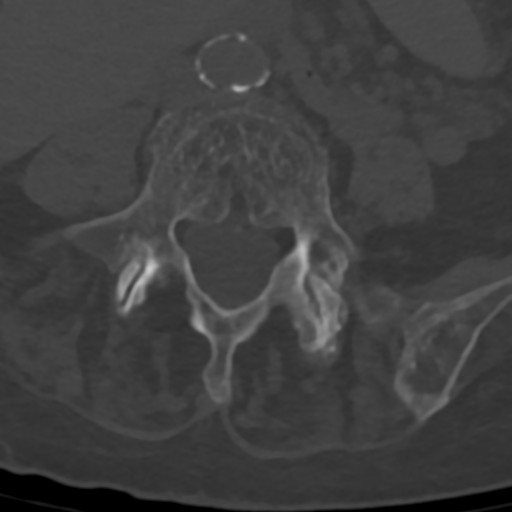
[im 36/93  bone]
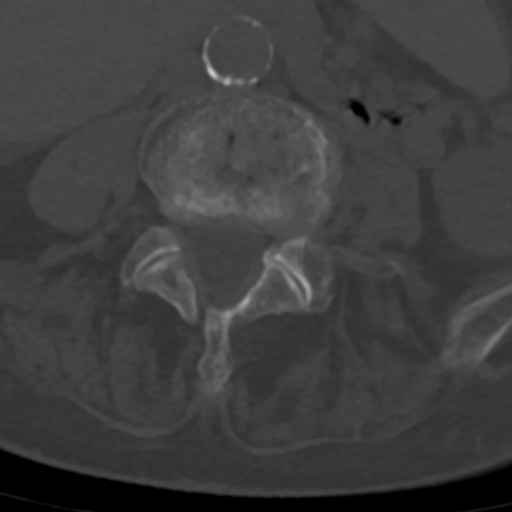
[im 50/93  soft-tissue]
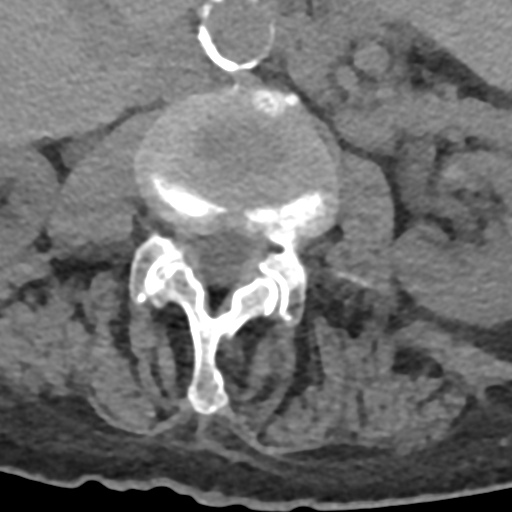
[im 50/93  bone]
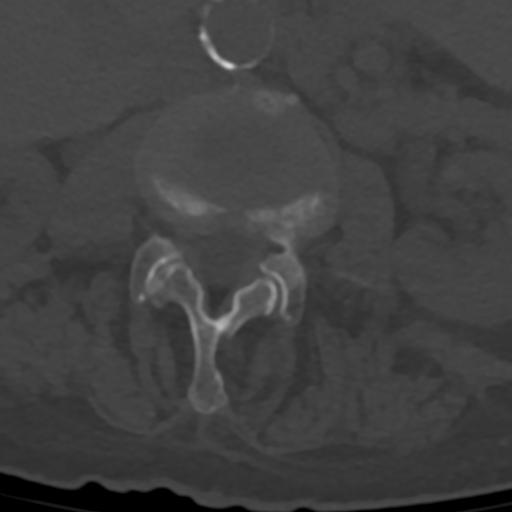
[im 57/93  bone]
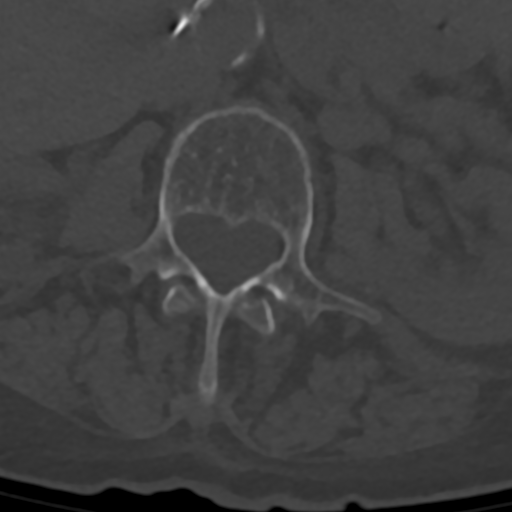
[im 64/93  bone]
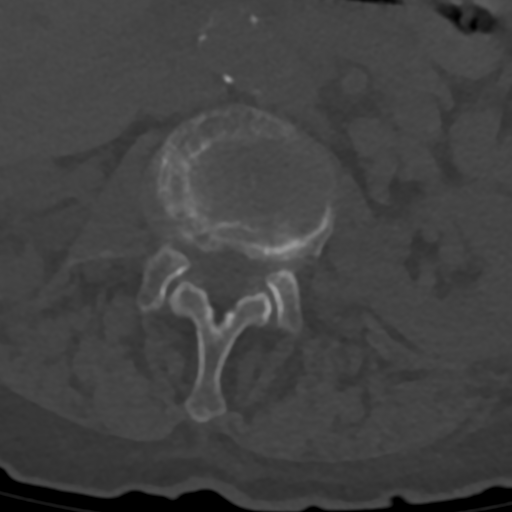
[im 78/93  bone]
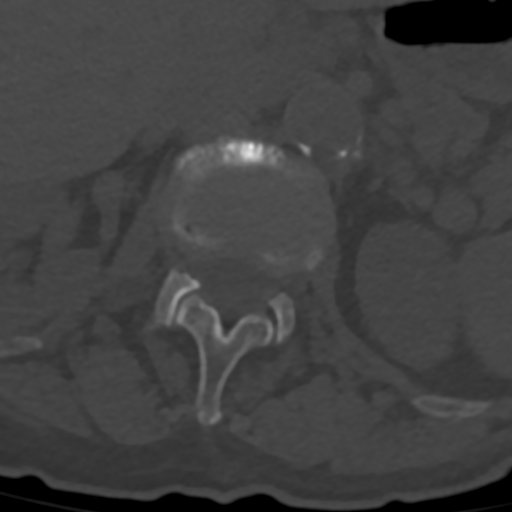
[im 85/93  soft-tissue]
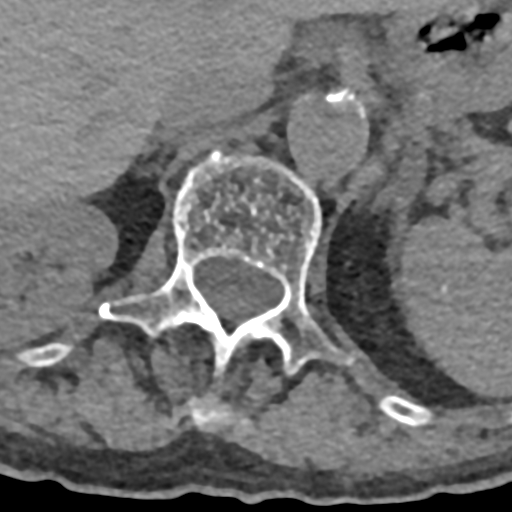
[im 85/93  bone]
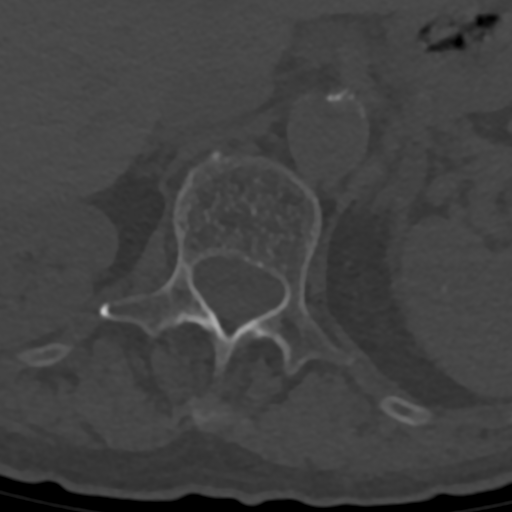

[Series 9: cor bone · coronal · 0.34mm/px · 1 of 78 slices shown]
[im 39/78  bone]
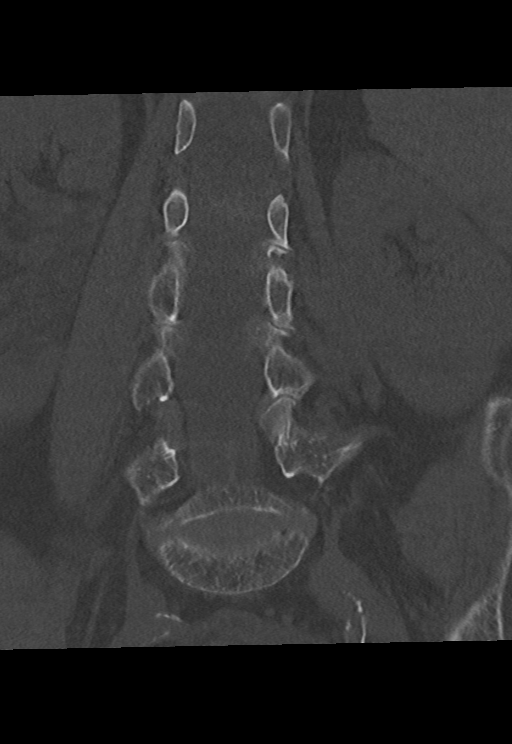

[Series 12: sag st · sagittal · 0.46mm/px · 5 of 76 slices shown]
[im 13/76  bone]
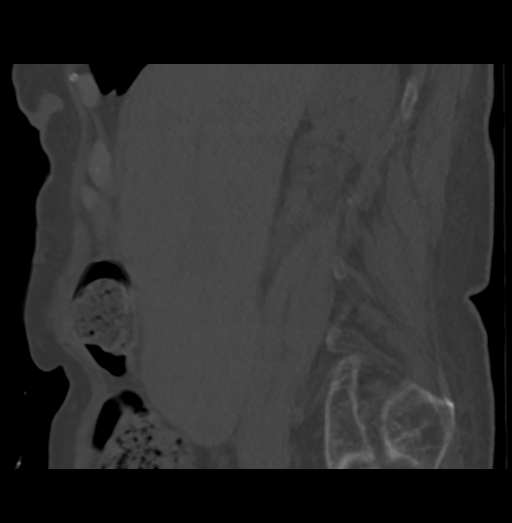
[im 26/76  bone]
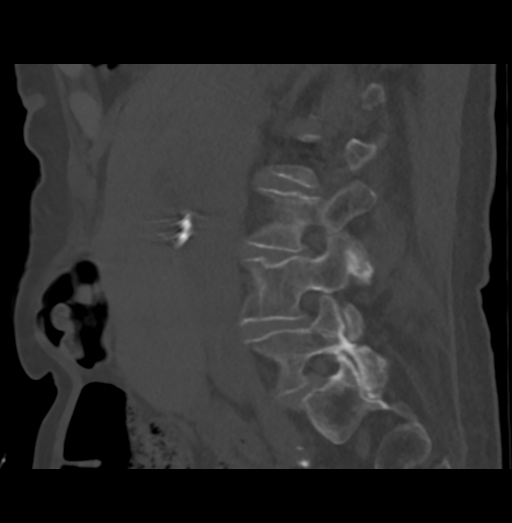
[im 38/76  bone]
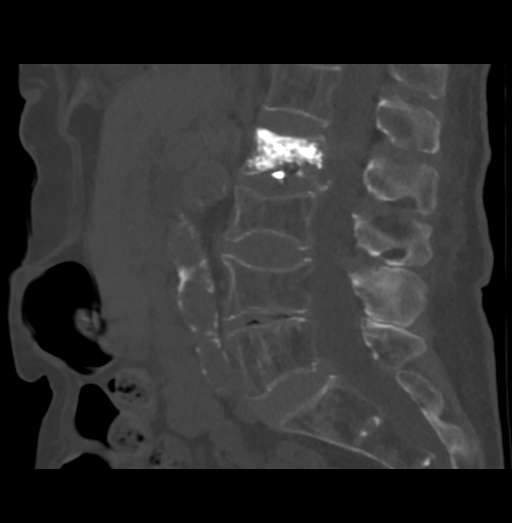
[im 51/76  bone]
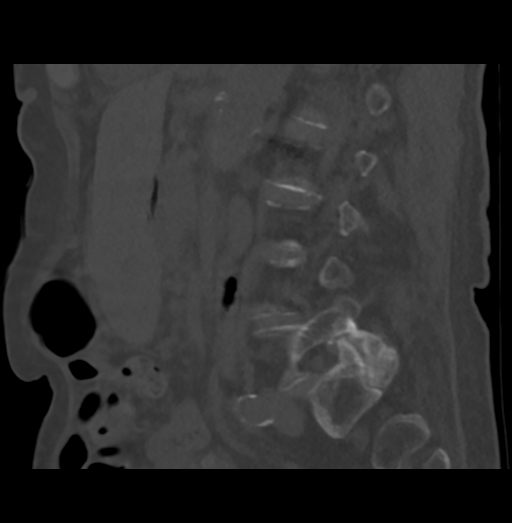
[im 63/76  bone]
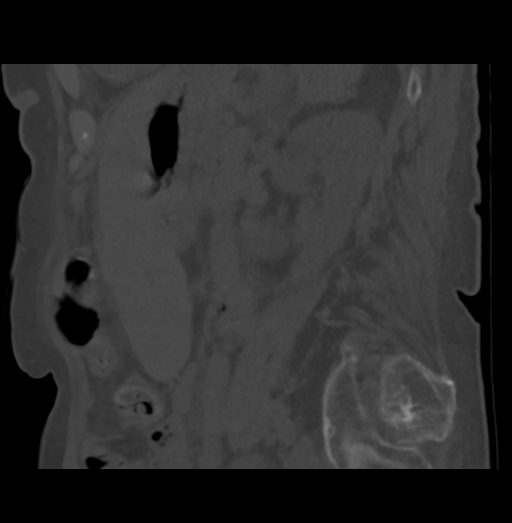

[15 of 33 positions shown; findings below may reference images not displayed]

FINDINGS: CT THORACIC SPINE FINDINGS

Alignment: Increased thoracic kyphosis.  No static subluxation.

Vertebrae: There augmented compression fractures at T5 and T10 with
a new compression fracture of T8. At T9, there is approximately 50%
height loss with 4 mm of retropulsion. Height loss at T5 and T10 is
unchanged.

Paraspinal and other soft tissues: Calcific aortic atherosclerosis.
Hiatal hernia. Calcified mediastinal and right hilar lymph nodes.

Disc levels: There is mild spinal canal narrowing at the T9 level
due to retropulsion. Otherwise, spinal canal patency is preserved.

CT LUMBAR SPINE FINDINGS

Segmentation: There are conflicting numbering schemes on prior
studies. There are 5 non-rib-bearing lumbar vertebral bodies. The
augmented level is considered L2.

Alignment: Grade 1 L5-S1 anterolisthesis is unchanged.

Vertebrae: Prior vertebral augmentation at L2 without progressive
height loss. Unchanged chronic height loss at L3 and L4. No acute
fracture.

Paraspinal and other soft tissues: Calcific aortic atherosclerosis.

Disc levels:

L1-2: Unremarkable.

L2-3: Mild spinal canal narrowing, unchanged.

L3-4: Unremarkable.

L4-5: Disc space narrowing without high-grade stenosis.

L5-S1: Facet hypertrophy with grade 1 anterolisthesis. No high-grade
stenosis.
IMPRESSION: 1. New compression fracture of T9 with approximately 50% height loss
and 4 mm of retropulsion. Mild spinal canal narrowing.
2. Unchanged appearance of augmented compression fractures at T5 and
T10.
3. Prior vertebral augmentation at L2 without progressive height
loss.
4. Unchanged chronic height loss at L3 and L4.

Aortic Atherosclerosis (BV29J-AHW.W).

## 2023-01-22 DIAGNOSIS — S065XAD Traumatic subdural hemorrhage with loss of consciousness status unknown, subsequent encounter: Secondary | ICD-10-CM | POA: Diagnosis not present

## 2023-01-23 ENCOUNTER — Encounter (HOSPITAL_COMMUNITY): Payer: Self-pay

## 2023-01-23 ENCOUNTER — Emergency Department (HOSPITAL_COMMUNITY): Payer: Medicare Other

## 2023-01-23 ENCOUNTER — Emergency Department (HOSPITAL_COMMUNITY)
Admission: EM | Admit: 2023-01-23 | Discharge: 2023-01-24 | Disposition: A | Discharge status: Home / Self Care | Payer: Medicare Other

## 2023-01-23 DIAGNOSIS — R001 Bradycardia, unspecified: Secondary | ICD-10-CM | POA: Diagnosis not present

## 2023-01-23 DIAGNOSIS — W01198A Fall on same level from slipping, tripping and stumbling with subsequent striking against other object, initial encounter: Secondary | ICD-10-CM | POA: Insufficient documentation

## 2023-01-23 DIAGNOSIS — Z7901 Long term (current) use of anticoagulants: Secondary | ICD-10-CM | POA: Insufficient documentation

## 2023-01-23 DIAGNOSIS — S0003XA Contusion of scalp, initial encounter: Secondary | ICD-10-CM

## 2023-01-23 DIAGNOSIS — R58 Hemorrhage, not elsewhere classified: Secondary | ICD-10-CM | POA: Diagnosis not present

## 2023-01-23 DIAGNOSIS — I700 Atherosclerosis of aorta: Secondary | ICD-10-CM | POA: Diagnosis not present

## 2023-01-23 DIAGNOSIS — M546 Pain in thoracic spine: Secondary | ICD-10-CM | POA: Insufficient documentation

## 2023-01-23 DIAGNOSIS — S72001A Fracture of unspecified part of neck of right femur, initial encounter for closed fracture: Secondary | ICD-10-CM | POA: Diagnosis not present

## 2023-01-23 DIAGNOSIS — S72002A Fracture of unspecified part of neck of left femur, initial encounter for closed fracture: Secondary | ICD-10-CM | POA: Diagnosis not present

## 2023-01-23 DIAGNOSIS — R0789 Other chest pain: Secondary | ICD-10-CM | POA: Diagnosis not present

## 2023-01-23 DIAGNOSIS — S3991XA Unspecified injury of abdomen, initial encounter: Secondary | ICD-10-CM | POA: Diagnosis not present

## 2023-01-23 DIAGNOSIS — S0081XA Abrasion of other part of head, initial encounter: Secondary | ICD-10-CM | POA: Diagnosis not present

## 2023-01-23 DIAGNOSIS — S299XXA Unspecified injury of thorax, initial encounter: Secondary | ICD-10-CM | POA: Diagnosis not present

## 2023-01-23 DIAGNOSIS — Z23 Encounter for immunization: Secondary | ICD-10-CM | POA: Insufficient documentation

## 2023-01-23 DIAGNOSIS — S0990XA Unspecified injury of head, initial encounter: Secondary | ICD-10-CM | POA: Diagnosis present

## 2023-01-23 DIAGNOSIS — G319 Degenerative disease of nervous system, unspecified: Secondary | ICD-10-CM | POA: Diagnosis not present

## 2023-01-23 DIAGNOSIS — I10 Essential (primary) hypertension: Secondary | ICD-10-CM | POA: Diagnosis not present

## 2023-01-23 DIAGNOSIS — M25551 Pain in right hip: Secondary | ICD-10-CM | POA: Diagnosis not present

## 2023-01-23 DIAGNOSIS — I6523 Occlusion and stenosis of bilateral carotid arteries: Secondary | ICD-10-CM | POA: Diagnosis not present

## 2023-01-23 DIAGNOSIS — S12600A Unspecified displaced fracture of seventh cervical vertebra, initial encounter for closed fracture: Secondary | ICD-10-CM | POA: Diagnosis not present

## 2023-01-23 DIAGNOSIS — W19XXXA Unspecified fall, initial encounter: Secondary | ICD-10-CM | POA: Diagnosis not present

## 2023-01-23 LAB — COMPREHENSIVE METABOLIC PANEL
ALT: 17 U/L (ref 0–44)
AST: 32 U/L (ref 15–41)
Albumin: 3.5 g/dL (ref 3.5–5.0)
Alkaline Phosphatase: 61 U/L (ref 38–126)
Anion gap: 11 (ref 5–15)
BUN: 8 mg/dL (ref 8–23)
CO2: 28 mmol/L (ref 22–32)
Calcium: 9 mg/dL (ref 8.9–10.3)
Chloride: 99 mmol/L (ref 98–111)
Creatinine, Ser: 0.77 mg/dL (ref 0.44–1.00)
GFR, Estimated: >60 mL/min (ref >60)
Glucose, Bld: 147 mg/dL — ABNORMAL HIGH (ref 70–99)
Potassium: 3.7 mmol/L (ref 3.5–5.1)
Sodium: 138 mmol/L (ref 135–145)
Total Bilirubin: 0.8 mg/dL (ref 0.3–1.2)
Total Protein: 6.4 g/dL — ABNORMAL LOW (ref 6.5–8.1)

## 2023-01-23 LAB — URINALYSIS, ROUTINE W REFLEX MICROSCOPIC
Bilirubin Urine: NEGATIVE
Glucose, UA: NEGATIVE mg/dL
Hgb urine dipstick: NEGATIVE
Ketones, ur: NEGATIVE mg/dL
Leukocytes,Ua: NEGATIVE
Nitrite: NEGATIVE
Protein, ur: NEGATIVE mg/dL
Specific Gravity, Urine: 1.006 (ref 1.005–1.030)
pH: 8 (ref 5.0–8.0)

## 2023-01-23 LAB — CBC
HCT: 46.1 % — ABNORMAL HIGH (ref 36.0–46.0)
Hemoglobin: 13.8 g/dL (ref 12.0–15.0)
MCH: 25.4 pg — ABNORMAL LOW (ref 26.0–34.0)
MCHC: 29.9 g/dL — ABNORMAL LOW (ref 30.0–36.0)
MCV: 84.7 fL (ref 80.0–100.0)
Platelets: 404 10*3/uL — ABNORMAL HIGH (ref 150–400)
RBC: 5.44 MIL/uL — ABNORMAL HIGH (ref 3.87–5.11)
RDW: 15.6 % — ABNORMAL HIGH (ref 11.5–15.5)
WBC: 10.2 10*3/uL (ref 4.0–10.5)
nRBC: 0 % (ref 0.0–0.2)

## 2023-01-23 LAB — I-STAT CHEM 8, ED
BUN: 9 mg/dL (ref 8–23)
Calcium, Ion: 1.16 mmol/L (ref 1.15–1.40)
Chloride: 98 mmol/L (ref 98–111)
Creatinine, Ser: 0.8 mg/dL (ref 0.44–1.00)
Glucose, Bld: 145 mg/dL — ABNORMAL HIGH (ref 70–99)
HCT: 47 % — ABNORMAL HIGH (ref 36.0–46.0)
Hemoglobin: 16 g/dL — ABNORMAL HIGH (ref 12.0–15.0)
Potassium: 3.6 mmol/L (ref 3.5–5.1)
Sodium: 138 mmol/L (ref 135–145)
TCO2: 29 mmol/L (ref 22–32)

## 2023-01-23 LAB — PROTIME-INR
INR: 1.1 (ref 0.8–1.2)
Prothrombin Time: 14.6 s (ref 11.4–15.2)

## 2023-01-23 MED ORDER — TETANUS-DIPHTH-ACELL PERTUSSIS 5-2.5-18.5 LF-MCG/0.5 IM SUSY
0.5000 mL | PREFILLED_SYRINGE | Freq: Once | INTRAMUSCULAR | Status: AC
  Administered 2023-01-23: 0.5 mL via INTRAMUSCULAR
  Filled 2023-01-23: qty 0.5

## 2023-01-23 MED ORDER — IOHEXOL 350 MG/ML SOLN
75.0000 mL | Freq: Once | INTRAVENOUS | Status: AC | PRN
  Administered 2023-01-23: 75 mL via INTRAVENOUS

## 2023-01-23 NOTE — Progress Notes (Signed)
Orthopedic Tech Progress Note Patient Details:  RHYDER GRANDIN 1929/11/25 [id number]  Level 2 trauma   Patient ID: [name], female   DOB: [birth year], 89 y.o.   MRN: [id number]  Donald Pore 01/23/2023, 9:46 PM

## 2023-01-23 NOTE — ED Triage Notes (Signed)
Patient brought in via EMS from [facility], Patient was lying in bed, got up tripped and fell on the floor. patient states she landed on her back, hitting head on metal corner of bed. 150mg  of fentanyl and 4mg  of Zofran given with EMS. Patient c/o back/head/ left hip/ left shoulder pain.

## 2023-01-23 NOTE — ED Notes (Addendum)
Trauma Response Nurse Documentation   [name] is a 89 y.o. female arriving to [facility] ED via EMS  On clopidogrel 75 mg daily and eliquis. Trauma was activated as a Level 2 by ED charge RN based on the following trauma criteria Elderly patients > 65 with head trauma on anti-coagulation (excluding ASA).  Patient cleared for CT by Dr. Maple Hudson EDP. Pt transported to CT with trauma response nurse present to monitor. RN remained with the patient throughout their absence from the department for clinical observation.   GCS 15.  History   Past Medical History:  Diagnosis Date   CAD (coronary artery disease)    mild nonobstructive disease on cath in 2003   [facility])    Cardiomyopathy    Probable Takotsubo, severe CP w/ normal cath in 1994. Severe CP in 2003 w/ widespread T wave inversions on ECG. Cath w/ minimal coronary disease but LV-gram showed periapical severe hypokinesis and basilar hyperkinesia (EF 40%). Last echo in 4/09 confirmed full LV functional recovery with EF 60%, no regional wall motion abnormalities, mild to moderate LVH.   Chronic diastolic CHF (congestive heart failure) (HCC) 09/13/2019   CVA (cerebral infarction)    Depression with anxiety 03/29/2011   lost husband 3'09   DVT (deep venous thrombosis) (HCC)    after venous ablation, R leg   Fracture 09/10/2007   L2, status post vertebroplasty of L2 performed by IR   GERD (gastroesophageal reflux disease)    Hemorrhoids    Hiatal hernia 03/29/2011   no nerve problems   Hyperlipidemia    Hyperplastic colon polyp 06/2001   Hypertension    Iron deficiency anemia due to chronic blood loss 12/30/2017   OA (osteoarthritis) of knee 03/29/2011   w/ bilateral knee pain-not a problem now   OSA (obstructive sleep apnea) 03/29/2011   no cpap used, not a problem now.   Osteoporosis    Otalgia of both ears    Dr. Emeline Darling   Polycythemia    Stroke [facility]) 03/29/2011   CVA x2 -last 10'12-?TIA(visual problems)   Varicose  vein      Past Surgical History:  Procedure Laterality Date   APPENDECTOMY     BACK SURGERY     BREAST BIOPSY     BREAST CYST EXCISION     BREAST LUMPECTOMY Left 2013   left stage I left breast cancer   CHOLECYSTECTOMY  04/09/2011   Procedure: LAPAROSCOPIC CHOLECYSTECTOMY WITH INTRAOPERATIVE CHOLANGIOGRAM;  Surgeon: Adolph Pollack, MD;  Location: [facility] ORS;  Service: General;  Laterality: N/A;   CORONARY ATHERECTOMY N/A 02/26/2022   Procedure: CORONARY ATHERECTOMY;  Surgeon: Yvonne Kendall, MD;  Location: [facility] INVASIVE CV LAB;  Service: Cardiovascular;  Laterality: N/A;   CORONARY LITHOTRIPSY N/A 02/26/2022   Procedure: CORONARY LITHOTRIPSY;  Surgeon: Yvonne Kendall, MD;  Location: [facility] INVASIVE CV LAB;  Service: Cardiovascular;  Laterality: N/A;   CORONARY STENT INTERVENTION N/A 02/26/2022   Procedure: CORONARY STENT INTERVENTION;  Surgeon: Yvonne Kendall, MD;  Location: [facility] INVAS[facility] CV LAB;  Service: Cardiovascular;  Laterality: N/A;   CORONARY ULTRASOUND/IVUS N/A 02/26/2022   Procedure: Intravascular Ultrasound/IVUS;  Surgeon: Yvonne Kendall, MD;  Location: [facility] INV[facility] CV LAB;  Service: Cardiovascular;  Laterality: N/A;   Dental Extraction     L maxillary molar   FEMUR IM NAIL Right 07/25/2022   Procedure: INTRAMEDULLARY (IM) NAIL FEMORAL;  Surgeon: Yolonda Kida, MD;  Location: [facility] OR;  Service: Orthopedics;  Laterality: Right;  hana table,carm.synthesis   INTRAMEDULLARY (  IM) NAIL INTERTROCHANTERIC Left 12/25/2019   Procedure: INTRAMEDULLARY (IM) NAIL INTERTROCHANTRIC;  Surgeon: Durene Romans, MD;  Location: [facility] ORS;  Service: Orthopedics;  Laterality: Left;   IR KYPHO THORACIC WITH BONE BIOPSY  09/15/2019   IR KYPHO THORACIC WITH BONE BIOPSY  07/13/2020       IR KYPHO THORACIC WITH BONE BIOPSY  07/13/2020   IR VERTEBROPLASTY CERV/THOR BX INC UNI/BIL INC/INJECT/IMAGING  11/10/2019   KYPHOPLASTY N/A 11/16/2020   Procedure: T11,  L1,KYPHOPLASTY;  Surgeon: Venita Lick, MD;   Location: [facility] OR;  Service: Orthopedics;  Laterality: N/A;   KYPHOSIS SURGERY  08/2007   cement used   LEFT HEART CATH AND CORONARY ANGIOGRAPHY N/A 02/23/2022   Procedure: LEFT HEART CATH AND CORONARY ANGIOGRAPHY;  Surgeon: Swaziland, Peter M, MD;  Location: [facility] INVASIVE CV LAB;  Service: Cardiovascular;  Laterality: N/A;   LEFT HEART CATH AND CORONARY ANGIOGRAPHY N/A 02/26/2022   Procedure: LEFT HEART CATH AND CORONARY ANGIOGRAPHY;  Surgeon: Yvonne Kendall, MD;  [facility]: [facility];  Service: Cardiovascular;  Laterality: N/A;   OVARIAN CYST SURGERY     left   RADIOLOGY WITH ANESTHESIA N/A 11/10/2019   Procedure: VERTEBROPLASTY;  Surgeon: Julieanne Cotton, MD;  Location: [facility] OR;  Service: Radiology;  Laterality: N/A;   RADIOLOGY WITH ANESTHESIA N/A 10/04/2022   Procedure: T3 vertebroplasty;  Surgeon: Julieanne Cotton, MD;  Location: [facility] OR;  Service: Radiology;  Laterality: N/A;   TEMPORARY PACEMAKER N/A 02/26/2022   Procedure: TEMPORARY PACEMAKER;  Surgeon: Yvonne Kendall, MD;  Location: [facility] CV [facility];  Service: Cardiovascular;  Laterality: N/A;   TONSILLECTOMY     TOTAL KNEE ARTHROPLASTY  06/2010   left   TUBAL LIGATION         Initial Focused Assessment (If applicable, or please see trauma documentation): Alert/oriented female presents via EMS from SNF after a fall with posterior head trauma, c/o back pain  CT's Completed:   CT Head, CT C-Spine, and CT Chest w/ contrast abd/pelvis  Interventions:  Trauma lab draw Portable chest and pelvis CT head/c-spine/chest/abd/pelvis TDAP  Plan for disposition:  Pending workup  Consults completed:  None at the time of this note  Event Summary: Presents via EMS after a fall at her SNF, with posterior head lac. Bleeding controlled. New oxygen demand, room air sat 82%. Portable XRAYS completed, then escorted to CT by TRN.   MTP Summary (If applicable): NA  Bedside handoff with ED RN Ndea.    Ragan Reale O Steward Sames  Trauma  Response RN  Please call TRN at [phone] for further assistance.

## 2023-01-23 NOTE — ED Notes (Signed)
Patient transported to X-ray 

## 2023-01-23 NOTE — ED Provider Notes (Signed)
[place] EMERGENCY DEPARTMENT AT [facility] Provider Note   CSN: [id number] Arrival date & time: 01/23/23  2112     History  Chief Complaint  Patient presents with   Fall    On Thinners    [name] is a 89 y.o. female.  Is a 89 year old female present emergency department for unwitnessed fall.  Patient reportedly lost balance per her report and fell to the floor struck her head on corner of bedpost laceration noted.  See ED course for further HPI and MDM   Fall       Home Medications Prior to Admission medications   Medication Sig Start Date End Date Taking? Authorizing Provider  alendronate (FOSAMAX) 70 MG tablet Take 1 tablet (70 mg total) by mouth once a week. Take with a full glass of water on an empty stomach. 12/19/21  Yes Paz, Nolon Rod, MD  clopidogrel (PLAVIX) 75 MG tablet Take 1 tablet (75 mg total) by mouth daily. 03/01/22  Yes Laverda Page B, NP  lidocaine (LIDODERM) 5 % Place 1 patch onto the skin daily. Remove & Discard patch within 12 hours or as directed by MD 10/10/22  Yes Almon Hercules, MD  LORazepam (ATIVAN) 0.5 MG tablet Take 0.5 mg by mouth 2 (two) times daily.   Yes [provider]  nitroGLYCERIN (NITROSTAT) 0.4 MG SL tablet Place 1 tablet (0.4 mg total) under the tongue every 5 (five) minutes x 3 doses as needed for chest pain. 02/28/22  Yes Laverda Page B, NP  pantoprazole (PROTONIX) 40 MG tablet Take 1 tablet (40 mg total) by mouth at bedtime. Patient taking differently: Take 40 mg by mouth daily. 02/28/22  Yes Laverda Page B, NP  polyethylene glycol (MIRALAX / GLYCOLAX) 17 g packet Take 17 g by mouth daily. 07/27/22  Yes Dorcas Carrow, MD  rosuvastatin (CRESTOR) 20 MG tablet Take 1 tablet (20 mg total) by mouth daily. Patient taking differently: Take 20 mg by mouth at bedtime. 03/01/22  Yes Laverda Page B, NP  sertraline (ZOLOFT) 25 MG tablet Take 25 mg by mouth at bedtime. 06/18/22  Yes [provider]   acetaminophen (TYLENOL) 500 MG tablet Take 1,000 mg by mouth every 8 (eight) hours as needed (for pain).    [provider]  amiodarone (PACERONE) 200 MG tablet Take 200 mg by mouth daily. Patient not taking: Reported on 01/03/2023 06/18/22   [provider]  amLODipine (NORVASC) 5 MG tablet Take 1 tablet (5 mg total) by mouth daily. Patient not taking: Reported on 01/03/2023 12/19/21   Wanda Plump, MD  apixaban (ELIQUIS) 2.5 MG TABS tablet Take 1 tablet (2.5 mg total) by mouth 2 (two) times daily. Patient taking differently: Take 2.5 mg by mouth daily. 02/28/22   Arty Baumgartner, NP  calcium carbonate (TUMS - DOSED IN MG ELEMENTAL CALCIUM) 500 MG chewable tablet Chew 1 tablet (200 mg of elemental calcium total) by mouth 3 (three) times daily as needed for indigestion or heartburn. Patient not taking: Reported on 01/03/2023 07/28/22   Dorcas Carrow, MD  docusate sodium (COLACE) 100 MG capsule Take 1 capsule (100 mg total) by mouth 2 (two) times daily. 07/27/22   Dorcas Carrow, MD  metoprolol tartrate (LOPRESSOR) 50 MG tablet Take 1 tablet (50 mg total) by mouth 2 (two) times daily. Patient not taking: Reported on 01/03/2023 12/19/21   Wanda Plump, MD  Nutritional Supplement LIQD Take 120 mLs by mouth in the morning, at noon, and  at bedtime. Med Pass    [provider]  Nutritional Supplements (NUTRITIONAL SUPPLEMENT PO) Take by mouth See admin instructions. Twice a day with lunch and dinner(per MAR)    [provider]  ondansetron (ZOFRAN-ODT) 4 MG disintegrating tablet Take 4 mg by mouth every 8 (eight) hours as needed. Patient not taking: Reported on 01/03/2023 08/24/22   [provider]  oxyCODONE (ROXICODONE) 5 MG immediate release tablet Take 1-2 tablets (5-10 mg total) by mouth every 4 (four) hours as needed for severe pain. Patient not taking: Reported on 01/03/2023 10/30/22   Mesner, Barbara Cower, MD      Allergies    Penicillins, Valsartan, Atorvastatin,  Carvedilol, Ezetimibe, Fluvastatin sodium, Magnesium hydroxide, Meloxicam, Pneumovax [pneumococcal polysaccharide vaccine], Quinapril hcl, Simvastatin, Topamax [topiramate], and Vit d-vit e-safflower oil    Review of Systems   Review of Systems  Physical Exam Updated Vital Signs BP 133/72 (BP Location: Right Arm)   Pulse 87   Temp 98 F (36.7 C) (Oral)   Resp 15   Ht 5\' 5"  (1.651 m)   Wt 50 kg   SpO2 94%   BMI 18.34 kg/m  Physical Exam Vitals and nursing note reviewed.  Constitutional:      General: She is not in acute distress.    Appearance: She is not toxic-appearing.  HENT:     Head: Normocephalic.     Mouth/Throat:     Mouth: Mucous membranes are moist.  Eyes:     Conjunctiva/sclera: Conjunctivae normal.  Neurological:     Mental Status: She is alert.     ED Results / Procedures / Treatments   Labs (all labs ordered are listed, but only abnormal results are displayed) Labs Reviewed  CBC - Abnormal; Notable for the following components:      Result Value   RBC 5.44 (*)    HCT 46.1 (*)    MCH 25.4 (*)    MCHC 29.9 (*)    RDW 15.6 (*)    Platelets 404 (*)    All other components within normal limits  URINALYSIS, ROUTINE W REFLEX MICROSCOPIC - Abnormal; Notable for the following components:   Color, Urine STRAW (*)    All other components within normal limits  COMPREHENSIVE METABOLIC PANEL - Abnormal; Notable for the following components:   Glucose, Bld 147 (*)    Total Protein 6.4 (*)    All other components within normal limits  I-STAT CHEM 8, ED - Abnormal; Notable for the following components:   Glucose, Bld 145 (*)    Hemoglobin 16.0 (*)    HCT 47.0 (*)    All other components within normal limits  PROTIME-INR    EKG EKG Interpretation Date/Time:  Wednesday January 23 2023 21:25:19 EDT Ventricular Rate:  94 PR Interval:  224 QRS Duration:  84 QT Interval:  374 QTC Calculation: 468 R Axis:   22  Text Interpretation: Sinus rhythm Prolonged  PR interval Probable anterior infarct, age indeterminate Confirmed by Estanislado Pandy [id number]) on 01/23/2023 11:56:03 PM  Radiology CT HEAD WO CONTRAST  Result Date: 01/23/2023 CLINICAL DATA:  Trauma. EXAM: CT HEAD WITHOUT CONTRAST CT CERVICAL SPINE WITHOUT CONTRAST TECHNIQUE: Multidetector CT imaging of the head and cervical spine was performed following the standard protocol without intravenous contrast. Multiplanar CT image reconstructions of the cervical spine were also generated. RADIATION DOSE REDUCTION: This exam was performed according to the departmental dose-optimization program which includes automated exposure control, adjustment of the mA and/or kV according to patient size  and/or use of iterative reconstruction technique. COMPARISON:  Head CT dated 09/29/2022. Thoracic spine CT dated 10/30/2022. FINDINGS: CT HEAD FINDINGS Brain: Moderate age-related atrophy and advanced chronic microvascular ischemic changes. Right colonic old lacunar infarct. There is no acute intracranial hemorrhage. No mass effect or midline shift no extra-axial fluid collection. Vascular: No hyperdense vessel or unexpected calcification. Skull: Normal. Negative for fracture or focal lesion. Sinuses/Orbits: The visualized paranasal sinuses and the right mastoid air cells are clear. Minimal left mastoid effusion. Other: Left posterior scalp contusion. CT CERVICAL SPINE FINDINGS Alignment: No acute subluxation. Skull base and vertebrae: Evaluation of the osseous structures is very limited due to severe osteoporosis. No definite acute fracture. Compression fracture of C7 as well as partially visualized fracture of T3 also seen on the CT of 10/30/2022. Soft tissues and spinal canal: There is induration and thickening of the anterior paravertebral soft tissue at T3-T4 concerning for small hematoma (76/5). Disc levels:  No acute findings. Upper chest: No acute findings. Other: Bilateral carotid bulb calcified plaques. IMPRESSION: 1. No  acute intracranial pathology. 2. Moderate age-related atrophy and advanced chronic microvascular ischemic changes. 3. No acute/traumatic cervical spine pathology. 4. Compression fracture of C7 as well as partially visualized fracture of T3 also seen on the CT of 10/30/2022. 5. Small anterior paravertebral hematoma at T3-T4. These results were called by telephone at the time of interpretation on 01/23/2023 at 11:18 pm to provider [facility] , who verbally acknowledged these results. Electronically Signed   By: Elgie Collard M.D.   On: 01/23/2023 23:22   CT CERVICAL SPINE WO CONTRAST  Result Date: 01/23/2023 CLINICAL DATA:  Trauma. EXAM: CT HEAD WITHOUT CONTRAST CT CERVICAL SPINE WITHOUT CONTRAST TECHNIQUE: Multidetector CT imaging of the head and cervical spine was performed following the standard protocol without intravenous contrast. Multiplanar CT image reconstructions of the cervical spine were also generated. RADIATION DOSE REDUCTION: This exam was performed according to the departmental dose-optimization program which includes automated exposure control, adjustment of the mA and/or kV according to patient size and/or use of iterative reconstruction technique. COMPARISON:  Head CT dated 09/29/2022. Thoracic spine CT dated 10/30/2022. FINDINGS: CT HEAD FINDINGS Brain: Moderate age-related atrophy and advanced chronic microvascular ischemic changes. Right colonic old lacunar infarct. There is no acute intracranial hemorrhage. No mass effect or midline shift no extra-axial fluid collection. Vascular: No hyperdense vessel or unexpected calcification. Skull: Normal. Negative for fracture or focal lesion. Sinuses/Orbits: The visualized paranasal sinuses and the right mastoid air cells are clear. Minimal left mastoid effusion. Other: Left posterior scalp contusion. CT CERVICAL SPINE FINDINGS Alignment: No acute subluxation. Skull base and vertebrae: Evaluation of the osseous structures is very limited due to  severe osteoporosis. No definite acute fracture. Compression fracture of C7 as well as partially visualized fracture of T3 also seen on the CT of 10/30/2022. Soft tissues and spinal canal: There is induration and thickening of the anterior paravertebral soft tissue at T3-T4 concerning for small hematoma (76/5). Disc levels:  No acute findings. Upper chest: No acute findings. Other: Bilateral carotid bulb calcified plaques. IMPRESSION: 1. No acute intracranial pathology. 2. Moderate age-related atrophy and advanced chronic microvascular ischemic changes. 3. No acute/traumatic cervical spine pathology. 4. Compression fracture of C7 as well as partially visualized fracture of T3 also seen on the CT of 10/30/2022. 5. Small anterior paravertebral hematoma at T3-T4. These results were called by telephone at the time of interpretation on 01/23/2023 at 11:18 pm to provider [facility] , who verbally acknowledged these  results. Electronically Signed   By: Elgie Collard M.D.   On: 01/23/2023 23:22   CT CHEST ABDOMEN PELVIS W CONTRAST  Result Date: 01/23/2023 CLINICAL DATA:  Trauma.  Fall. EXAM: CT CHEST, ABDOMEN, AND PELVIS WITH CONTRAST TECHNIQUE: Multidetector CT imaging of the chest, abdomen and pelvis was performed following the standard protocol during bolus administration of intravenous contrast. RADIATION DOSE REDUCTION: This exam was performed according to the departmental dose-optimization program which includes automated exposure control, adjustment of the mA and/or kV according to patient size and/or use of iterative reconstruction technique. CONTRAST:  75mL OMNIPAQUE IOHEXOL 350 MG/ML SOLN COMPARISON:  Thoracic spine x-ray 10/30/2022. CT chest abdomen and pelvis 09/28/2022. FINDINGS: CT CHEST FINDINGS Cardiovascular: Heart is enlarged. Aorta is ectatic. There are atherosclerotic calcifications of the aorta and coronary arteries. There is no pericardial effusion. Mediastinum/Nodes: There is a large hiatal  hernia containing the proximal stomach. There is mild fluid distention of the esophagus. The thyroid gland is within normal limits. There are calcified subcarinal and right hilar lymph nodes. No mediastinal hematoma or pneumomediastinum identified. Lungs/Pleura: There are atelectatic changes in the bilateral lower lobes. There is no pleural effusion or pneumothorax. Trachea and central airways are patent. There is scarring in the right lung apex. Musculoskeletal: There are numerous healed bilateral rib fractures. No acute fractures are seen. The bones are osteopenic. There are degenerative changes of the right shoulder. Vertebroplasty changes are noted at T4, T8, T9, T10 and T12 similar to prior study. Chronic compression deformity of T7 appears stable. There has been progression of compression deformity of T3. CT ABDOMEN PELVIS FINDINGS Hepatobiliary: Indeterminate lesion in the left lobe of the liver measures 2.2 x 2.8 cm image 3/51 this appears similar to prior. The liver is enlarged, unchanged. Gallbladder is surgically absent. There is no significant biliary ductal dilatation. Pancreas: Unremarkable. No pancreatic ductal dilatation or surrounding inflammatory changes. Spleen: No splenic injury or perisplenic hematoma. Calcified granulomas are present. Adrenals/Urinary Tract: No adrenal hemorrhage or renal injury identified. Bladder is unremarkable. Stomach/Bowel: No evidence of bowel wall thickening, distention, or inflammatory changes. The appendix is not seen. Vascular/Lymphatic: Aortic atherosclerosis. No enlarged abdominal or pelvic lymph nodes. Reproductive: Status post hysterectomy. No adnexal masses. Other: No abdominal wall hernia or abnormality. No abdominopelvic ascites. Musculoskeletal: The bones are diffusely osteopenic. Bilateral hip screws are present. Vertebroplasty changes at L1 and L2 are again noted. Mild compression deformities of L3 on L4 are unchanged. There are healed inferior pubic rami  fractures bilaterally. No acute fractures are seen. IMPRESSION: 1. Progression of T3 compression deformity is age indeterminate. Correlate for point tenderness. 2. No other acute posttraumatic sequelae in the chest, abdomen or pelvis. 3. Cardiomegaly. 4. Large hiatal hernia. 5. Indeterminate lesion in the left lobe of the liver appears similar to prior. This can be further evaluated with MRI if clinically appropriate. Aortic Atherosclerosis (ICD10-I70.0). Electronically Signed   By: Darliss Cheney M.D.   On: 01/23/2023 23:02   DG Pelvis Portable  Result Date: 01/23/2023 CLINICAL DATA:  Trauma EXAM: PORTABLE PELVIS 1-2 VIEWS COMPARISON:  09/28/2022 FINDINGS: SI joint degenerative change. Pubic symphysis appears intact. Old inferior pubic rami fractures. Intramedullary rods and fixating screws within the proximal femurs with remote fracture deformities. No acute fracture or dislocation IMPRESSION: No acute osseous abnormality. Old fracture deformities of the inferior pubic rami and proximal humerus status post bilateral femoral intramedullary rodding. Electronically Signed   By: Jasmine Pang M.D.   On: 01/23/2023 22:27   DG Chest [facility]  1 View  Result Date: 01/23/2023 CLINICAL DATA:  Trauma. EXAM: PORTABLE CHEST 1 VIEW COMPARISON:  Chest radiograph dated 01/03/2023. FINDINGS: No focal consolidation, pleural effusion, pneumothorax. Bibasilar linear atelectasis/scarring. Stable cardiac silhouette. Severe osteopenia with multilevel old compression fracture and vertebroplasty. 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 Probable old right rib fractures. Multiple surgical clips in the left breast. No definite acute osseous pathology. Evaluation of the osseous structures however is limited due to severe osteopenia. IMPRESSION: 1. No acute cardiopulmonary process. 2. 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 Electronically Signed   By: Elgie Collard M.D.   On:  01/23/2023 22:26    Procedures .Critical Care  Performed by: Coral Spikes, DO Authorized by: Coral Spikes, DO   Critical care provider statement:    Critical care time (minutes):  30   Critical care time was exclusive of:  Separately billable procedures and treating other patients   Critical care was necessary to treat or prevent imminent or life-threatening deterioration of the following conditions:  Trauma   Critical care was time spent personally by me on the following activities:  Examination of patient, ordering and review of laboratory studies, ordering and performing treatments and interventions, ordering and review of radiographic studies, pulse oximetry, re-evaluation of patient's condition and review of old charts   I assumed direction of critical care for this patient from another provider in my specialty: no       Medications Ordered in ED Medications  Tdap (BOOSTRIX) injection 0.5 mL (0.5 mLs Intramuscular Given 01/23/23 2312)  iohexol (OMNIPAQUE) 350 MG/ML injection 75 mL (75 mLs Intravenous Contrast Given 01/23/23 2204)    ED Course/ Medical Decision Making/ A&P Clinical Course as of 01/24/23 0000  Wed Jan 23, 2023  2124 Seen on arrival.  Unwitnessed fall back onto corner of bed.  On blood thinners.  Reportedly mechanical, but patient poor historian.  Laceration to the back of the head.  Complains of mid back pain, anterior chest wall pain and right hip pain.  Airway is clear, breath sounds bilaterally.  Equal pulses throughout.  No gross localizing deficits.  Will get trauma labs and imaging. [TY]  2239 DG Chest Port 1 View IMPRESSION: 1. No acute cardiopulmonary process. 2. 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   [TY]  2240 DG Pelvis Portable IMPRESSION: No acute osseous abnormality. Old fracture deformities of the inferior pubic rami and proximal humerus status post bilateral femoral intramedullary rodding.   [TY]  2312 CT  CHEST ABDOMEN PELVIS W CONTRAST IMPRESSION: 1. Progression of T3 compression deformity is age indeterminate. Correlate for point tenderness. 2. No other acute posttraumatic sequelae in the chest, abdomen or pelvis. 3. Cardiomegaly. 4. Large hiatal hernia. 5. Indeterminate lesion in the left lobe of the liver appears similar to prior. This can be further evaluated with MRI if clinically appropriate.  Aortic Atherosclerosis (ICD10-I70.0).   [TY]  2327 CT HEAD WO CONTRAST IMPRESSION: 1. No acute intracranial pathology. 2. Moderate age-related atrophy and advanced chronic microvascular ischemic changes. 3. No acute/traumatic cervical spine pathology. 4. Compression fracture of C7 as well as partially visualized fracture of T3 also seen on the CT of 10/30/2022. 5. Small anterior paravertebral hematoma at T3-T4.   [TY]  2356 Discussed with daughter.  Notes that mother does intermittently require oxygen.  She also reports history of compression fractures.  Feels comfortable with mother going back to facility. [TY]    Clinical Course  User Index [TY] Coral Spikes, DO                                 Medical Decision Making Is a 89 year old female presenting the emergency department as a trauma activation mechanical fall on blood thinners.  Appears to be on Eliquis per chart review.  She is afebrile was seemingly hypoxic with good waveform in the mid 80s on arrival.  He remained hemodynamically stable.  Physical exam with some diffuse tenderness to chest wall and to back.  No bony tenderness to extremities.  She has small abrasion to her posterior skull, no need for suture.  Tetanus was updated.  CT findings as noted in ED course reassuring.  Patient's pain seemingly controlled.  Labs with no significant electrolyte abnormality.  Normal kidney function.  UA negative for infection.  No anemia.  No leukocytosis on her CBC.  Given patient's overall reassuring workup feel that she is  appropriate for discharge.  Discussed with daughter; see ED course.  Amount and/or Complexity of Data Reviewed Labs: ordered. Radiology: ordered. Decision-making details documented in ED Course. ECG/medicine tests: ordered.  Risk Prescription drug management.          Final Clinical Impression(s) / ED Diagnoses Final diagnoses:  None    Rx / DC Orders ED Discharge Orders     None         Coral Spikes, DO 01/24/23 0000

## 2023-01-24 DIAGNOSIS — R404 Transient alteration of awareness: Secondary | ICD-10-CM | POA: Diagnosis not present

## 2023-01-24 DIAGNOSIS — Z743 Need for continuous supervision: Secondary | ICD-10-CM | POA: Diagnosis not present

## 2023-01-24 NOTE — Discharge Instructions (Signed)
Return immediately develop sudden onset headache, vision changes, unilateral weakness, seizures, passout, severe pain, numbness tingling changes in sensation.  Numbness in your genital area.  Bowel or bladder incontinence or any new or worsening symptoms that are concerning to you.

## 2023-01-27 DIAGNOSIS — R0782 Intercostal pain: Secondary | ICD-10-CM | POA: Diagnosis not present

## 2023-01-27 IMAGING — XA IR KYPHO VERTEBRAL THORACIC AUGMENTATION
1 series · 14 of 24 positions shown · non-contrast
Comparison: none

INDICATION: 89-year-old female with intractable back pain and CT of the thoracic
spine showing a new T8 vertebral compression fracture. She comes to
our service today for a fluoroscopy guided core biopsy and
kyphoplasty.
TECHNIQUE: Informed written consent was obtained from the patient after a
thorough discussion of the procedural risks, benefits and
alternatives. All questions were addressed. Maximal Sterile Barrier
Technique was utilized including caps, mask, sterile gowns, sterile
gloves, sterile drape, hand hygiene and skin antiseptic. A timeout
was performed prior to the initiation of the procedure.

[Series 300: ir kypho thoracic with bone biopsy · 14 of 39 slices shown]
[im 1/39]
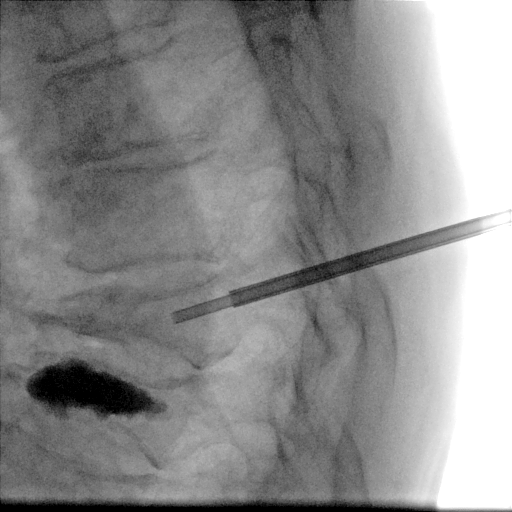
[im 4/39]
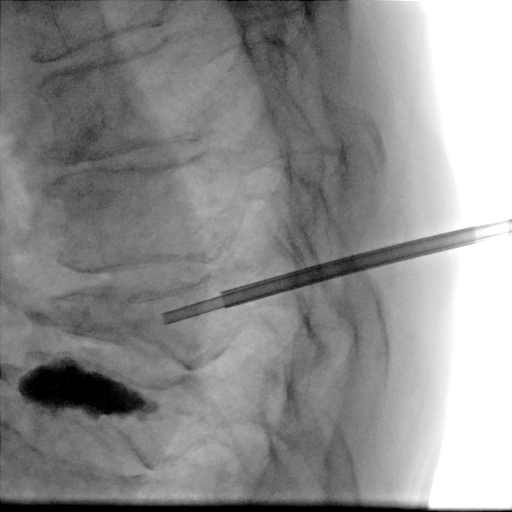
[im 7/39]
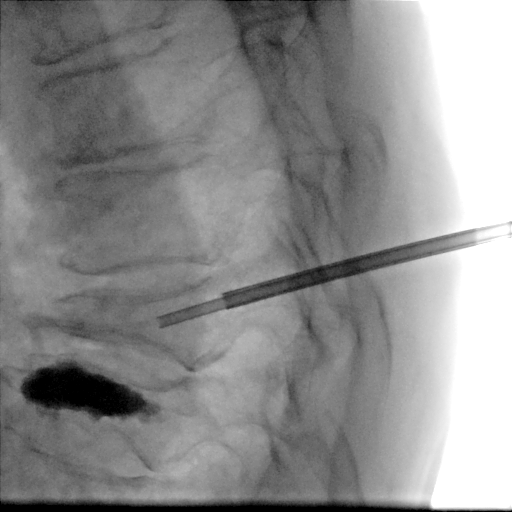
[im 10/39]
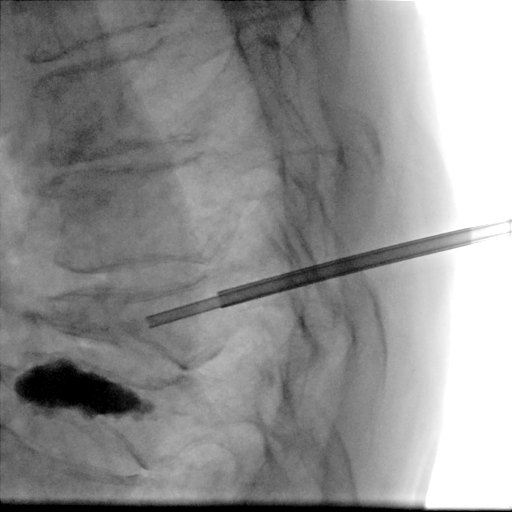
[im 12/39]
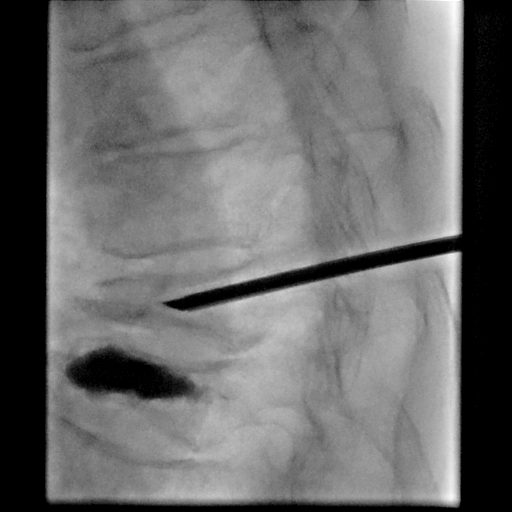
[im 15/39]
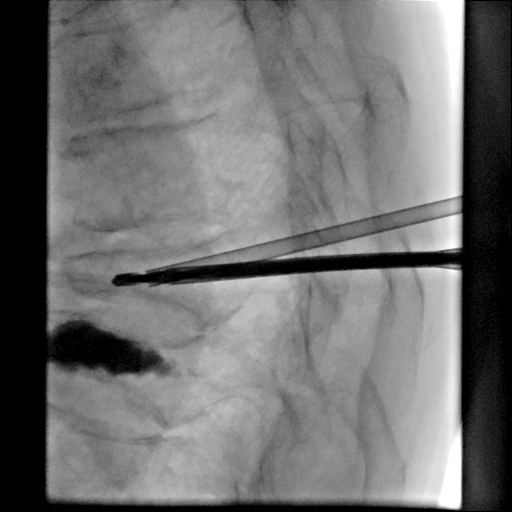
[im 19/39]
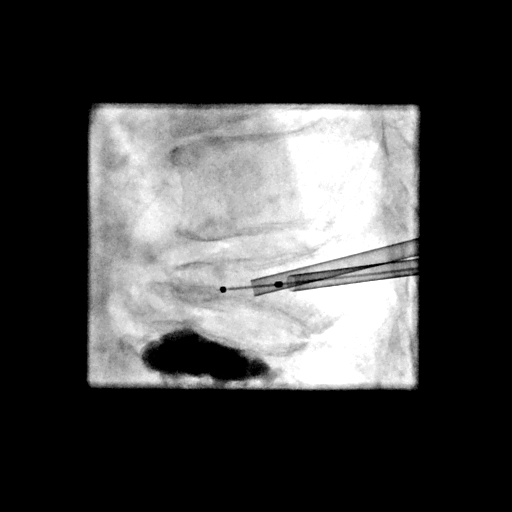
[im 20/39]
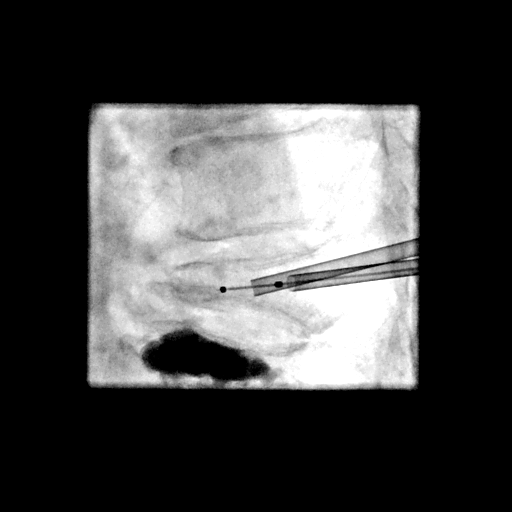
[im 24/39]
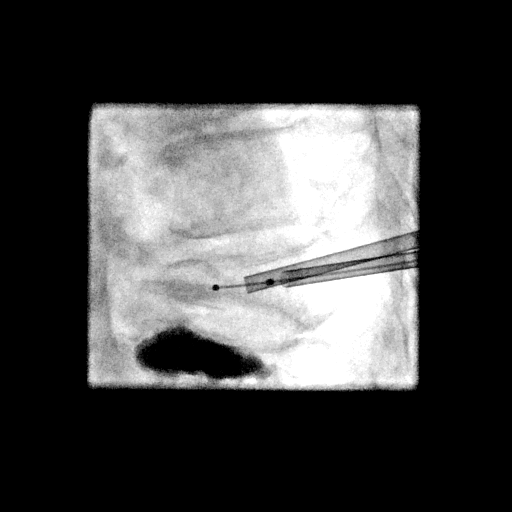
[im 27/39]
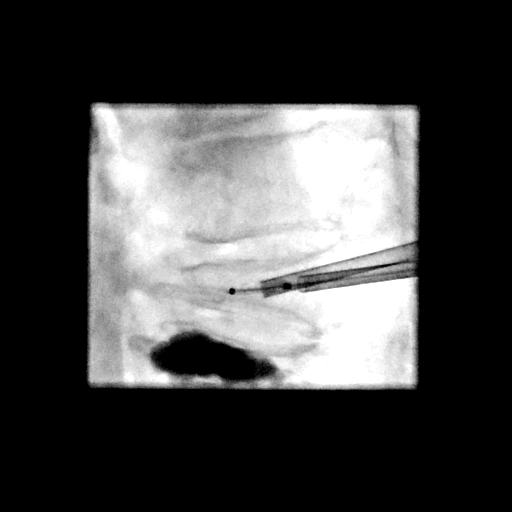
[im 30/39]
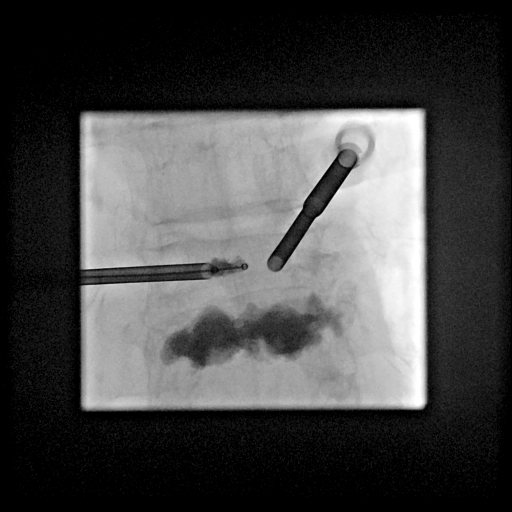
[im 32/39]
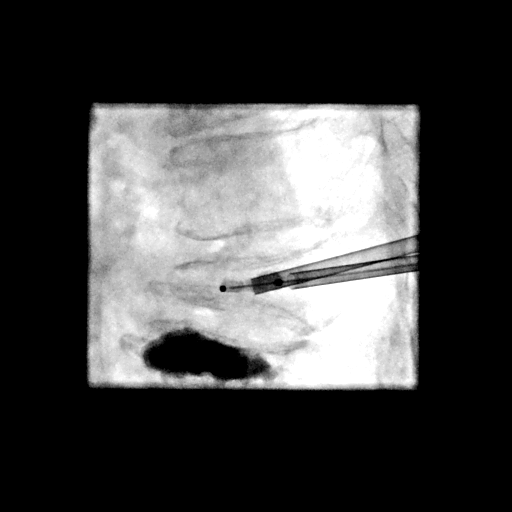
[im 35/39]
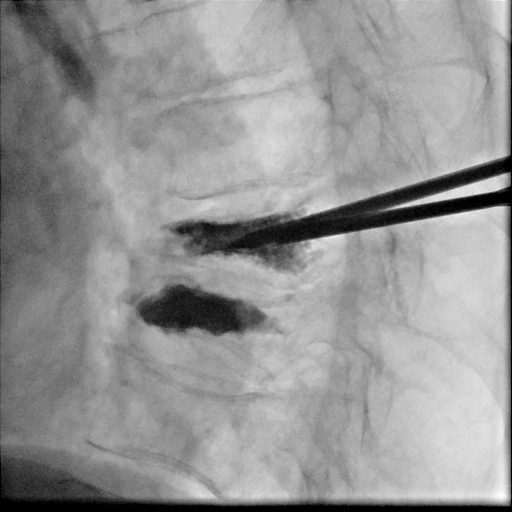
[im 39/39]
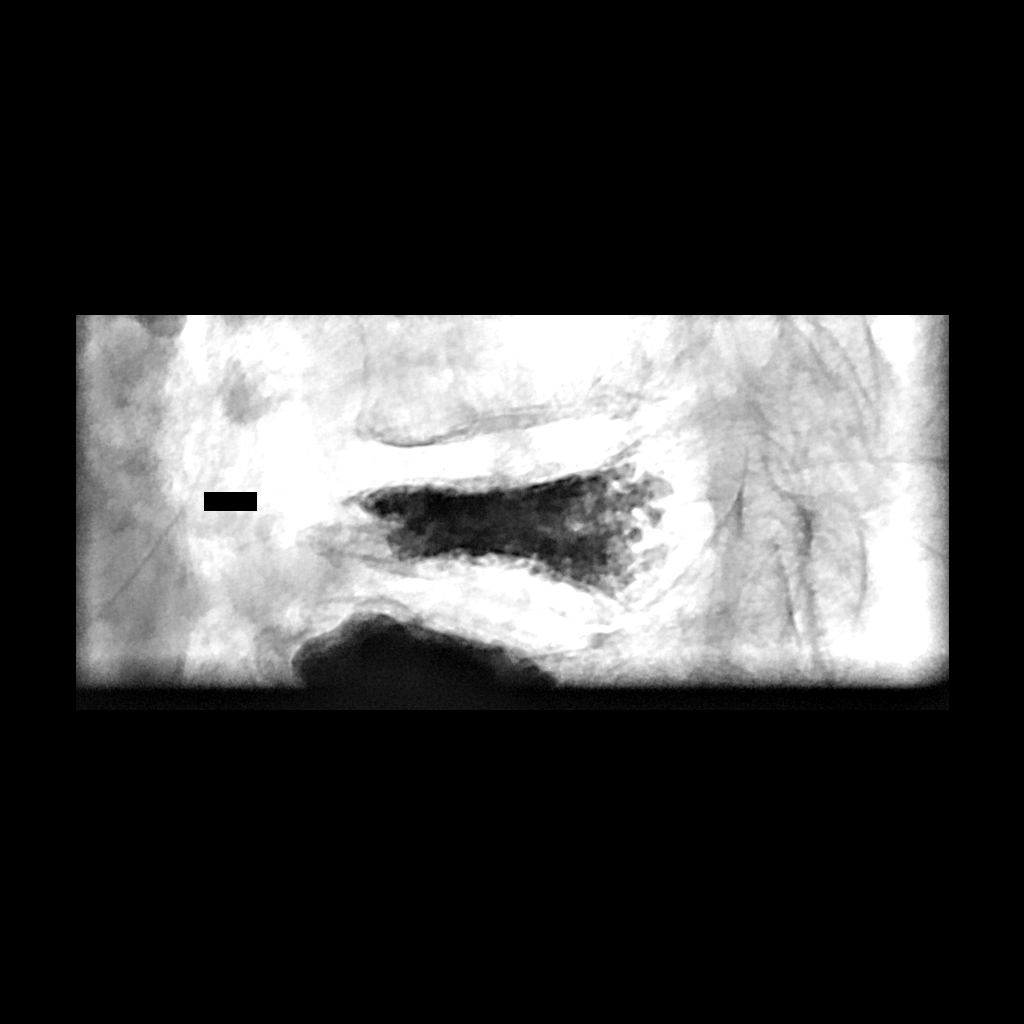

[14 of 24 positions shown; findings below may reference images not displayed]

EXAM:
Fluoroscopy guided T8 core biopsy and kyphoplasty

MEDICATIONS:
As antibiotic prophylaxis, Vancomycin 1 gm IV was ordered
pre-procedure and administered intravenously within 1 hour of
incision.

ANESTHESIA/SEDATION:
Moderate (conscious) sedation was employed during this procedure. A
total of Versed 4 mg and Fentanyl 200 mcg was administered
intravenously.

Moderate Sedation Time: 60 minutes. The patient's level of
consciousness and vital signs were monitored continuously by
radiology nursing throughout the procedure under my direct
supervision.

FLUOROSCOPY TIME:  Fluoroscopy Time: 18 minutes (757 mGy)

COMPLICATIONS:
None immediate.
PROCEDURE:
The patient was placed prone on the fluoroscopic table. Nasal oxygen
was administered. Physiologic monitoring was performed throughout
the duration of the procedure. The skin overlying the thoracic
region was prepped and draped in the usual sterile fashion.

Under fluoroscopy, the T8 vertebral body was delineated and the skin
area was marked. The skin was infiltrated with a 1% Lidocaine
approximately 2.5 cm lateral to the spinous process projection on
the right. Using a 22-gauge spinal needle, the soft issue and the
peripedicular space and periosteum were infiltrated with Bupivacaine
0.25%. A skin incision was made at the access site.

Subsequently, an 11-gauge Kyphon trocar was inserted under
fluoroscopic guidance until contact with the pedicle was obtained.
The trocar was inserted under light Jheydi Pasapera into the pedicle
until the posterior boundaries of the vertebral body was reached.
The diamond mandrill was removed and one core biopsy wasobtained.

The skin was infiltrated with a 1% Lidocaine approximately 2.5 cm
lateral to the spinous process projection on the left. Using a
22-gauge spinal needle, the soft issue and the peripedicular space
and periosteum were infiltrated with Bupivacaine 0.25%. A skin
incision was made at the access site. Subsequently, an 11-gauge
Kyphon trocar was inserted under fluoroscopic guidance until contact
with the pedicle was obtained. The trocar was inserted under light
Jheydi Pasapera into the pedicle until the posterior boundaries of
the vertebral body was reached. The diamond mandrill was removed.

A bone drill was coaxially advanced within the anterior third of the
vertebral body and then exchanged for inflatable Kyphon balloons.
These were centered within the mid-aspect of the vertebral body. The
balloons were inflated to create a void to serve as a repository for
the bone cement. Both balloons were deflated and through both
cannulas, under continuous fluoroscopy guidance in the AP and
lateral views, the vertebral body was filled with previously mixed
polymethyl-methacrylate (PMMA) added to barium for opacification.
Both cannulas were later removed.

The access sites were cleaned and covered with a sterile bandage.
IMPRESSION: Successful fluoroscopy-guided bilateral transpedicular approach for
T8 vertebral body core bone biopsy and kyphoplasty for treatment of
osteoporotic fragility fracture. Bone sample obtained was sent for
pathology analysis.

## 2023-01-28 DIAGNOSIS — J90 Pleural effusion, not elsewhere classified: Secondary | ICD-10-CM | POA: Diagnosis not present

## 2023-01-28 DIAGNOSIS — K449 Diaphragmatic hernia without obstruction or gangrene: Secondary | ICD-10-CM | POA: Diagnosis not present

## 2023-01-28 DIAGNOSIS — R0789 Other chest pain: Secondary | ICD-10-CM | POA: Diagnosis not present

## 2023-01-30 DIAGNOSIS — R0789 Other chest pain: Secondary | ICD-10-CM | POA: Diagnosis not present

## 2023-02-02 DIAGNOSIS — I10 Essential (primary) hypertension: Secondary | ICD-10-CM | POA: Diagnosis not present

## 2023-02-03 ENCOUNTER — Emergency Department (HOSPITAL_COMMUNITY): Payer: Medicare Other

## 2023-02-03 ENCOUNTER — Encounter (HOSPITAL_COMMUNITY): Payer: Self-pay | Admitting: Family Medicine

## 2023-02-03 ENCOUNTER — Other Ambulatory Visit: Payer: Self-pay

## 2023-02-03 ENCOUNTER — Inpatient Hospital Stay (HOSPITAL_COMMUNITY)
Admission: EM | Admit: 2023-02-03 | Discharge: 2023-02-13 | DRG: 515 | Disposition: A | Discharge status: Skilled Nursing Facility | Payer: Medicare Other | Source: Skilled Nursing Facility | Attending: Internal Medicine | Admitting: Internal Medicine

## 2023-02-03 DIAGNOSIS — F32A Depression, unspecified: Secondary | ICD-10-CM | POA: Diagnosis present

## 2023-02-03 DIAGNOSIS — Z01810 Encounter for preprocedural cardiovascular examination: Secondary | ICD-10-CM | POA: Diagnosis not present

## 2023-02-03 DIAGNOSIS — R0902 Hypoxemia: Secondary | ICD-10-CM | POA: Diagnosis not present

## 2023-02-03 DIAGNOSIS — K219 Gastro-esophageal reflux disease without esophagitis: Secondary | ICD-10-CM | POA: Diagnosis present

## 2023-02-03 DIAGNOSIS — M545 Low back pain, unspecified: Secondary | ICD-10-CM | POA: Diagnosis not present

## 2023-02-03 DIAGNOSIS — S0990XA Unspecified injury of head, initial encounter: Secondary | ICD-10-CM | POA: Diagnosis not present

## 2023-02-03 DIAGNOSIS — I110 Hypertensive heart disease with heart failure: Secondary | ICD-10-CM | POA: Diagnosis present

## 2023-02-03 DIAGNOSIS — I10 Essential (primary) hypertension: Secondary | ICD-10-CM | POA: Diagnosis present

## 2023-02-03 DIAGNOSIS — Z955 Presence of coronary angioplasty implant and graft: Secondary | ICD-10-CM

## 2023-02-03 DIAGNOSIS — Z803 Family history of malignant neoplasm of breast: Secondary | ICD-10-CM

## 2023-02-03 DIAGNOSIS — S22020D Wedge compression fracture of second thoracic vertebra, subsequent encounter for fracture with routine healing: Secondary | ICD-10-CM | POA: Diagnosis not present

## 2023-02-03 DIAGNOSIS — S22069A Unspecified fracture of T7-T8 vertebra, initial encounter for closed fracture: Secondary | ICD-10-CM | POA: Diagnosis not present

## 2023-02-03 DIAGNOSIS — Z91018 Allergy to other foods: Secondary | ICD-10-CM

## 2023-02-03 DIAGNOSIS — K449 Diaphragmatic hernia without obstruction or gangrene: Secondary | ICD-10-CM | POA: Diagnosis present

## 2023-02-03 DIAGNOSIS — I480 Paroxysmal atrial fibrillation: Secondary | ICD-10-CM | POA: Diagnosis not present

## 2023-02-03 DIAGNOSIS — Z7901 Long term (current) use of anticoagulants: Secondary | ICD-10-CM

## 2023-02-03 DIAGNOSIS — S22020A Wedge compression fracture of second thoracic vertebra, initial encounter for closed fracture: Secondary | ICD-10-CM | POA: Diagnosis not present

## 2023-02-03 DIAGNOSIS — Z79899 Other long term (current) drug therapy: Secondary | ICD-10-CM

## 2023-02-03 DIAGNOSIS — I252 Old myocardial infarction: Secondary | ICD-10-CM | POA: Diagnosis not present

## 2023-02-03 DIAGNOSIS — D751 Secondary polycythemia: Secondary | ICD-10-CM | POA: Diagnosis not present

## 2023-02-03 DIAGNOSIS — M479 Spondylosis, unspecified: Secondary | ICD-10-CM | POA: Diagnosis present

## 2023-02-03 DIAGNOSIS — Z66 Do not resuscitate: Secondary | ICD-10-CM | POA: Diagnosis not present

## 2023-02-03 DIAGNOSIS — Z853 Personal history of malignant neoplasm of breast: Secondary | ICD-10-CM

## 2023-02-03 DIAGNOSIS — R64 Cachexia: Secondary | ICD-10-CM | POA: Diagnosis not present

## 2023-02-03 DIAGNOSIS — I5032 Chronic diastolic (congestive) heart failure: Secondary | ICD-10-CM | POA: Diagnosis not present

## 2023-02-03 DIAGNOSIS — Z806 Family history of leukemia: Secondary | ICD-10-CM

## 2023-02-03 DIAGNOSIS — Z8719 Personal history of other diseases of the digestive system: Secondary | ICD-10-CM

## 2023-02-03 DIAGNOSIS — R54 Age-related physical debility: Secondary | ICD-10-CM | POA: Diagnosis present

## 2023-02-03 DIAGNOSIS — M47814 Spondylosis without myelopathy or radiculopathy, thoracic region: Secondary | ICD-10-CM | POA: Diagnosis not present

## 2023-02-03 DIAGNOSIS — E43 Unspecified severe protein-calorie malnutrition: Secondary | ICD-10-CM | POA: Diagnosis present

## 2023-02-03 DIAGNOSIS — I2510 Atherosclerotic heart disease of native coronary artery without angina pectoris: Secondary | ICD-10-CM | POA: Diagnosis present

## 2023-02-03 DIAGNOSIS — Y9301 Activity, walking, marching and hiking: Secondary | ICD-10-CM | POA: Diagnosis present

## 2023-02-03 DIAGNOSIS — Z8249 Family history of ischemic heart disease and other diseases of the circulatory system: Secondary | ICD-10-CM

## 2023-02-03 DIAGNOSIS — E785 Hyperlipidemia, unspecified: Secondary | ICD-10-CM | POA: Diagnosis present

## 2023-02-03 DIAGNOSIS — M47816 Spondylosis without myelopathy or radiculopathy, lumbar region: Secondary | ICD-10-CM | POA: Diagnosis not present

## 2023-02-03 DIAGNOSIS — F17210 Nicotine dependence, cigarettes, uncomplicated: Secondary | ICD-10-CM | POA: Diagnosis not present

## 2023-02-03 DIAGNOSIS — M810 Age-related osteoporosis without current pathological fracture: Secondary | ICD-10-CM | POA: Diagnosis present

## 2023-02-03 DIAGNOSIS — D500 Iron deficiency anemia secondary to blood loss (chronic): Secondary | ICD-10-CM | POA: Diagnosis not present

## 2023-02-03 DIAGNOSIS — Z800 Family history of malignant neoplasm of digestive organs: Secondary | ICD-10-CM

## 2023-02-03 DIAGNOSIS — J9811 Atelectasis: Secondary | ICD-10-CM | POA: Diagnosis not present

## 2023-02-03 DIAGNOSIS — M542 Cervicalgia: Secondary | ICD-10-CM | POA: Diagnosis not present

## 2023-02-03 DIAGNOSIS — S22050A Wedge compression fracture of T5-T6 vertebra, initial encounter for closed fracture: Secondary | ICD-10-CM | POA: Diagnosis not present

## 2023-02-03 DIAGNOSIS — I5022 Chronic systolic (congestive) heart failure: Secondary | ICD-10-CM | POA: Diagnosis not present

## 2023-02-03 DIAGNOSIS — G4733 Obstructive sleep apnea (adult) (pediatric): Secondary | ICD-10-CM | POA: Diagnosis present

## 2023-02-03 DIAGNOSIS — R29890 Loss of height: Secondary | ICD-10-CM | POA: Diagnosis not present

## 2023-02-03 DIAGNOSIS — R296 Repeated falls: Secondary | ICD-10-CM | POA: Diagnosis present

## 2023-02-03 DIAGNOSIS — M4856XA Collapsed vertebra, not elsewhere classified, lumbar region, initial encounter for fracture: Secondary | ICD-10-CM | POA: Diagnosis not present

## 2023-02-03 DIAGNOSIS — Z8673 Personal history of transient ischemic attack (TIA), and cerebral infarction without residual deficits: Secondary | ICD-10-CM

## 2023-02-03 DIAGNOSIS — Z7902 Long term (current) use of antithrombotics/antiplatelets: Secondary | ICD-10-CM

## 2023-02-03 DIAGNOSIS — Z681 Body mass index (BMI) 19 or less, adult: Secondary | ICD-10-CM

## 2023-02-03 DIAGNOSIS — J90 Pleural effusion, not elsewhere classified: Secondary | ICD-10-CM | POA: Diagnosis not present

## 2023-02-03 DIAGNOSIS — Y842 Radiological procedure and radiotherapy as the cause of abnormal reaction of the patient, or of later complication, without mention of misadventure at the time of the procedure: Secondary | ICD-10-CM | POA: Diagnosis present

## 2023-02-03 DIAGNOSIS — R000 Tachycardia, unspecified: Secondary | ICD-10-CM | POA: Diagnosis not present

## 2023-02-03 DIAGNOSIS — R918 Other nonspecific abnormal finding of lung field: Secondary | ICD-10-CM | POA: Diagnosis not present

## 2023-02-03 DIAGNOSIS — S12600A Unspecified displaced fracture of seventh cervical vertebra, initial encounter for closed fracture: Secondary | ICD-10-CM | POA: Diagnosis not present

## 2023-02-03 DIAGNOSIS — M549 Dorsalgia, unspecified: Secondary | ICD-10-CM | POA: Diagnosis not present

## 2023-02-03 DIAGNOSIS — Z9181 History of falling: Secondary | ICD-10-CM

## 2023-02-03 DIAGNOSIS — W01198A Fall on same level from slipping, tripping and stumbling with subsequent striking against other object, initial encounter: Secondary | ICD-10-CM | POA: Diagnosis present

## 2023-02-03 DIAGNOSIS — I5042 Chronic combined systolic (congestive) and diastolic (congestive) heart failure: Secondary | ICD-10-CM | POA: Diagnosis present

## 2023-02-03 DIAGNOSIS — J9611 Chronic respiratory failure with hypoxia: Secondary | ICD-10-CM | POA: Diagnosis present

## 2023-02-03 DIAGNOSIS — E871 Hypo-osmolality and hyponatremia: Secondary | ICD-10-CM | POA: Diagnosis not present

## 2023-02-03 DIAGNOSIS — Z96652 Presence of left artificial knee joint: Secondary | ICD-10-CM | POA: Diagnosis present

## 2023-02-03 DIAGNOSIS — S22029A Unspecified fracture of second thoracic vertebra, initial encounter for closed fracture: Secondary | ICD-10-CM | POA: Diagnosis not present

## 2023-02-03 DIAGNOSIS — Z86711 Personal history of pulmonary embolism: Secondary | ICD-10-CM

## 2023-02-03 DIAGNOSIS — R451 Restlessness and agitation: Secondary | ICD-10-CM | POA: Diagnosis not present

## 2023-02-03 DIAGNOSIS — W19XXXA Unspecified fall, initial encounter: Secondary | ICD-10-CM

## 2023-02-03 DIAGNOSIS — F418 Other specified anxiety disorders: Secondary | ICD-10-CM | POA: Diagnosis present

## 2023-02-03 DIAGNOSIS — Z86718 Personal history of other venous thrombosis and embolism: Secondary | ICD-10-CM

## 2023-02-03 DIAGNOSIS — D72829 Elevated white blood cell count, unspecified: Secondary | ICD-10-CM

## 2023-02-03 DIAGNOSIS — S199XXA Unspecified injury of neck, initial encounter: Secondary | ICD-10-CM | POA: Diagnosis not present

## 2023-02-03 DIAGNOSIS — S2243XA Multiple fractures of ribs, bilateral, initial encounter for closed fracture: Secondary | ICD-10-CM | POA: Diagnosis not present

## 2023-02-03 DIAGNOSIS — I672 Cerebral atherosclerosis: Secondary | ICD-10-CM | POA: Diagnosis not present

## 2023-02-03 DIAGNOSIS — S22030A Wedge compression fracture of third thoracic vertebra, initial encounter for closed fracture: Secondary | ICD-10-CM | POA: Diagnosis not present

## 2023-02-03 DIAGNOSIS — M25552 Pain in left hip: Secondary | ICD-10-CM | POA: Diagnosis not present

## 2023-02-03 DIAGNOSIS — Z7983 Long term (current) use of bisphosphonates: Secondary | ICD-10-CM

## 2023-02-03 DIAGNOSIS — M5134 Other intervertebral disc degeneration, thoracic region: Secondary | ICD-10-CM | POA: Diagnosis not present

## 2023-02-03 DIAGNOSIS — E876 Hypokalemia: Secondary | ICD-10-CM | POA: Diagnosis present

## 2023-02-03 DIAGNOSIS — Z880 Allergy status to penicillin: Secondary | ICD-10-CM

## 2023-02-03 DIAGNOSIS — I25110 Atherosclerotic heart disease of native coronary artery with unstable angina pectoris: Secondary | ICD-10-CM | POA: Diagnosis present

## 2023-02-03 DIAGNOSIS — R0989 Other specified symptoms and signs involving the circulatory and respiratory systems: Secondary | ICD-10-CM | POA: Diagnosis not present

## 2023-02-03 DIAGNOSIS — M5136 Other intervertebral disc degeneration, lumbar region: Secondary | ICD-10-CM | POA: Diagnosis not present

## 2023-02-03 DIAGNOSIS — Z888 Allergy status to other drugs, medicaments and biological substances status: Secondary | ICD-10-CM

## 2023-02-03 DIAGNOSIS — S32592A Other specified fracture of left pubis, initial encounter for closed fracture: Secondary | ICD-10-CM | POA: Diagnosis not present

## 2023-02-03 DIAGNOSIS — S32591A Other specified fracture of right pubis, initial encounter for closed fracture: Secondary | ICD-10-CM | POA: Diagnosis not present

## 2023-02-03 LAB — BASIC METABOLIC PANEL
Anion gap: 12 (ref 5–15)
BUN: <5 mg/dL — ABNORMAL LOW (ref 8–23)
CO2: 29 mmol/L (ref 22–32)
Calcium: 9.1 mg/dL (ref 8.9–10.3)
Chloride: 91 mmol/L — ABNORMAL LOW (ref 98–111)
Creatinine, Ser: 0.64 mg/dL (ref 0.44–1.00)
GFR, Estimated: >60 mL/min (ref >60)
Glucose, Bld: 120 mg/dL — ABNORMAL HIGH (ref 70–99)
Potassium: 3.4 mmol/L — ABNORMAL LOW (ref 3.5–5.1)
Sodium: 132 mmol/L — ABNORMAL LOW (ref 135–145)

## 2023-02-03 LAB — CBC WITH DIFFERENTIAL/PLATELET
Abs Immature Granulocytes: 0.07 10*3/uL (ref 0.00–0.07)
Basophils Absolute: 0.1 10*3/uL (ref 0.0–0.1)
Basophils Relative: 1 %
Eosinophils Absolute: 0.1 10*3/uL (ref 0.0–0.5)
Eosinophils Relative: 1 %
HCT: 49.3 % — ABNORMAL HIGH (ref 36.0–46.0)
Hemoglobin: 15.4 g/dL — ABNORMAL HIGH (ref 12.0–15.0)
Immature Granulocytes: 1 %
Lymphocytes Relative: 5 %
Lymphs Abs: 0.7 10*3/uL (ref 0.7–4.0)
MCH: 26.6 pg (ref 26.0–34.0)
MCHC: 31.2 g/dL (ref 30.0–36.0)
MCV: 85 fL (ref 80.0–100.0)
Monocytes Absolute: 1 10*3/uL (ref 0.1–1.0)
Monocytes Relative: 9 %
Neutro Abs: 10.4 10*3/uL — ABNORMAL HIGH (ref 1.7–7.7)
Neutrophils Relative %: 83 %
Platelets: 508 10*3/uL — ABNORMAL HIGH (ref 150–400)
RBC: 5.8 MIL/uL — ABNORMAL HIGH (ref 3.87–5.11)
RDW: 15.4 % (ref 11.5–15.5)
WBC: 12.3 10*3/uL — ABNORMAL HIGH (ref 4.0–10.5)
nRBC: 0 % (ref 0.0–0.2)

## 2023-02-03 LAB — PROTIME-INR
INR: 1.1 (ref 0.8–1.2)
Prothrombin Time: 14.6 seconds (ref 11.4–15.2)

## 2023-02-03 LAB — MAGNESIUM: Magnesium: 2 mg/dL (ref 1.7–2.4)

## 2023-02-03 MED ORDER — LIDOCAINE 5 % EX PTCH
1.0000 | MEDICATED_PATCH | CUTANEOUS | Status: DC
  Administered 2023-02-03 – 2023-02-12 (×10): 1 via TRANSDERMAL
  Filled 2023-02-03 (×10): qty 1

## 2023-02-03 MED ORDER — ONDANSETRON HCL 4 MG/2ML IJ SOLN: 4.0000 mg | Freq: Four times a day (QID) | INTRAMUSCULAR | Status: DC | PRN

## 2023-02-03 MED ORDER — APIXABAN 2.5 MG PO TABS
2.5000 mg | ORAL_TABLET | Freq: Two times a day (BID) | ORAL | Status: DC
  Administered 2023-02-03 – 2023-02-05 (×4): 2.5 mg via ORAL
  Filled 2023-02-03 (×4): qty 1

## 2023-02-03 MED ORDER — ROSUVASTATIN CALCIUM 20 MG PO TABS
20.0000 mg | ORAL_TABLET | Freq: Every day | ORAL | Status: DC
  Administered 2023-02-03 – 2023-02-12 (×10): 20 mg via ORAL
  Filled 2023-02-03 (×10): qty 1

## 2023-02-03 MED ORDER — ONDANSETRON HCL 4 MG PO TABS: 4.0000 mg | ORAL_TABLET | Freq: Four times a day (QID) | ORAL | Status: DC | PRN

## 2023-02-03 MED ORDER — PANTOPRAZOLE SODIUM 40 MG PO TBEC
40.0000 mg | DELAYED_RELEASE_TABLET | Freq: Two times a day (BID) | ORAL | Status: DC
  Administered 2023-02-03 – 2023-02-13 (×20): 40 mg via ORAL
  Filled 2023-02-03 (×20): qty 1

## 2023-02-03 MED ORDER — CLOPIDOGREL BISULFATE 75 MG PO TABS
75.0000 mg | ORAL_TABLET | Freq: Every day | ORAL | Status: DC
  Administered 2023-02-03 – 2023-02-05 (×3): 75 mg via ORAL
  Filled 2023-02-03 (×3): qty 1

## 2023-02-03 MED ORDER — HYDROMORPHONE HCL 1 MG/ML IJ SOLN
0.5000 mg | Freq: Once | INTRAMUSCULAR | Status: AC
  Administered 2023-02-03: 0.5 mg via INTRAVENOUS
  Filled 2023-02-03: qty 1

## 2023-02-03 MED ORDER — OXYCODONE HCL 5 MG PO TABS: 10.0000 mg | ORAL_TABLET | Freq: Once | ORAL | Status: DC

## 2023-02-03 MED ORDER — DOCUSATE SODIUM 100 MG PO CAPS
100.0000 mg | ORAL_CAPSULE | Freq: Two times a day (BID) | ORAL | Status: DC
  Administered 2023-02-03 – 2023-02-13 (×18): 100 mg via ORAL
  Filled 2023-02-03 (×20): qty 1

## 2023-02-03 MED ORDER — SERTRALINE HCL 25 MG PO TABS
25.0000 mg | ORAL_TABLET | Freq: Every day | ORAL | Status: DC
  Administered 2023-02-03 – 2023-02-12 (×10): 25 mg via ORAL
  Filled 2023-02-03 (×10): qty 1

## 2023-02-03 MED ORDER — ACETAMINOPHEN 325 MG PO TABS
650.0000 mg | ORAL_TABLET | Freq: Four times a day (QID) | ORAL | Status: DC | PRN
  Administered 2023-02-03: 650 mg via ORAL
  Filled 2023-02-03: qty 2

## 2023-02-03 MED ORDER — ACETAMINOPHEN 500 MG PO TABS: 1000.0000 mg | ORAL_TABLET | Freq: Three times a day (TID) | ORAL | Status: DC

## 2023-02-03 MED ORDER — POTASSIUM CHLORIDE CRYS ER 20 MEQ PO TBCR
20.0000 meq | EXTENDED_RELEASE_TABLET | Freq: Once | ORAL | Status: AC
  Administered 2023-02-03: 20 meq via ORAL
  Filled 2023-02-03: qty 1

## 2023-02-03 MED ORDER — FENTANYL CITRATE PF 50 MCG/ML IJ SOSY
50.0000 ug | PREFILLED_SYRINGE | Freq: Once | INTRAMUSCULAR | Status: AC
  Administered 2023-02-03: 50 ug via INTRAVENOUS
  Filled 2023-02-03: qty 1

## 2023-02-03 MED ORDER — FUROSEMIDE 20 MG PO TABS
20.0000 mg | ORAL_TABLET | Freq: Every day | ORAL | Status: DC
  Administered 2023-02-03 – 2023-02-04 (×2): 20 mg via ORAL
  Filled 2023-02-03 (×2): qty 1

## 2023-02-03 MED ORDER — SODIUM CHLORIDE 0.9 % IV SOLN: INTRAVENOUS | Status: AC

## 2023-02-03 MED ORDER — OXYCODONE HCL 5 MG PO TABS
5.0000 mg | ORAL_TABLET | Freq: Once | ORAL | Status: AC
  Administered 2023-02-03: 5 mg via ORAL
  Filled 2023-02-03: qty 1

## 2023-02-03 MED ORDER — LORAZEPAM 0.5 MG PO TABS
0.5000 mg | ORAL_TABLET | Freq: Two times a day (BID) | ORAL | Status: DC
  Administered 2023-02-03 – 2023-02-13 (×20): 0.5 mg via ORAL
  Filled 2023-02-03 (×20): qty 1

## 2023-02-03 MED ORDER — OXYCODONE HCL 5 MG PO TABS
5.0000 mg | ORAL_TABLET | Freq: Three times a day (TID) | ORAL | Status: DC | PRN
  Administered 2023-02-03 – 2023-02-04 (×2): 5 mg via ORAL
  Filled 2023-02-03 (×2): qty 1

## 2023-02-03 MED ORDER — ACETAMINOPHEN 500 MG PO TABS
1000.0000 mg | ORAL_TABLET | Freq: Three times a day (TID) | ORAL | Status: AC
  Administered 2023-02-03 – 2023-02-05 (×8): 1000 mg via ORAL
  Filled 2023-02-03 (×8): qty 2

## 2023-02-03 MED ORDER — ACETAMINOPHEN 650 MG RE SUPP: 650.0000 mg | Freq: Four times a day (QID) | RECTAL | Status: DC | PRN

## 2023-02-03 NOTE — ED Provider Notes (Signed)
Patient care taken over at shift handoff from outgoing provider.  Plan is to reassess after ambulation.  If patient is unable to ambulate, still in pain will admit to medicine.  Physical Exam  BP (!) 172/97   Pulse 87   Temp (!) 97.4 F (36.3 C) (Oral)   Resp (!) 24   SpO2 96%   Physical Exam Vitals and nursing note reviewed.  Constitutional:      Appearance: Normal appearance.     Comments: Elderly, frail appearing.  HENT:     Head: Normocephalic and atraumatic.     Mouth/Throat:     Mouth: Mucous membranes are moist.  Eyes:     General: No scleral icterus. Cardiovascular:     Rate and Rhythm: Normal rate and regular rhythm.     Pulses: Normal pulses.     Heart sounds: Normal heart sounds.  Pulmonary:     Effort: Pulmonary effort is normal.     Breath sounds: Normal breath sounds.  Abdominal:     General: Abdomen is flat.     Palpations: Abdomen is soft.     Tenderness: There is no abdominal tenderness.  Musculoskeletal:        General: No deformity.     Comments: No leg shortening or malrotation.  Skin:    General: Skin is warm.     Findings: No rash.  Neurological:     General: No focal deficit present.     Mental Status: She is alert.  Psychiatric:        Mood and Affect: Mood normal.     Procedures  Procedures  ED Course / MDM   Clinical Course as of 02/03/23 0703  Sun Feb 03, 2023  1610 Patient with acute T2 fracture, new from 01/23/23. No ICH or traumatic intracranial pathology. [KH]  0501 Spoke with NSG, Cosentino PA-C, regarding findings of T2 compression fx with retropulsion. NSG rec's are for pain control and outpatient follow up. No bracing will unfortunately reach this location for stabilization. [KH]    Clinical Course User Index [KH] Antony Madura, PA-C   Medical Decision Making Amount and/or Complexity of Data Reviewed Labs: ordered. Radiology: ordered.  Risk Prescription drug management.   89 year old female with a history significant  for CAD, hypertension, diastolic CHF, A-fib on Eliquis presents for evaluation after a mechanical fall around 2230 last night.  CT scan showed acute T2 fracture, no ICH or traumatic intracranial pathology.  Neurosurgery was consulted by previous EDP who recommended pain control and outpatient follow-up.  Patient was given oxycodone, fentanyl and Dilaudid however pain remains the same.  She refused walking due to pain.  Patient will require admission for pain control, PT/OT.  Disposition Admit to medicine.  This chart was dictated using voice recognition software.  Despite best efforts to proofread,  errors can occur which can change the documentation meaning.         Jeanelle Malling, PA 02/03/23 9604    Gilda Crease, MD 02/04/23 [phone]

## 2023-02-03 NOTE — ED Provider Notes (Signed)
[place] EMERGENCY DEPARTMENT AT [facility] Provider Note   CSN: [id number] Arrival date & time: 02/03/23  0240     History  Chief Complaint  Patient presents with   Marletta Lor    [name] is a 89 y.o. female.  89 year old female with a history of hyperlipidemia, cardiomyopathy, CAD, HTN, dCHF (LVEF 50-55%), Afib on Eliquis presents from [facility]. Patient reports a mechanical fall around 2230 tonight at her facility. Was walking when she lost her balance and fell into a wall. In bed upon EMS arrival. Patient c/o mid sternal pain and low back pain. Denies LOC, N/V. On chronic 2-3L O2 via [place] at baseline.  The history is provided by the patient and the EMS personnel. No language interpreter was used.  Fall       Home Medications Prior to Admission medications   Medication Sig Start Date End Date Taking? Authorizing Provider  azithromycin (ZITHROMAX) 250 MG tablet Take 250 mg by mouth as directed. 01/28/23  Yes [provider]  furosemide (LASIX) 20 MG tablet Take 20 mg by mouth daily. 01/29/23  Yes [provider]  HYDROcodone-acetaminophen (NORCO/VICODIN) 5-325 MG tablet Take 1 tablet by mouth 3 (three) times daily as needed. 01/28/23  Yes [provider]  acetaminophen (TYLENOL) 500 MG tablet Take 1,000 mg by mouth every 8 (eight) hours as needed (for pain).    [provider]  alendronate (FOSAMAX) 70 MG tablet Take 1 tablet (70 mg total) by mouth once a week. Take with a full glass of water on an empty stomach. 12/19/21   Wanda Plump, MD  amiodarone (PACERONE) 200 MG tablet Take 200 mg by mouth daily. Patient not taking: Reported on 01/03/2023 06/18/22   [provider]  amLODipine (NORVASC) 5 MG tablet Take 1 tablet (5 mg total) by mouth daily. Patient not taking: Reported on 01/03/2023 12/19/21   Wanda Plump, MD  apixaban (ELIQUIS) 2.5 MG TABS tablet Take 1 tablet (2.5 mg total) by mouth 2 (two) times daily. Patient  taking differently: Take 2.5 mg by mouth daily. 02/28/22   Arty Baumgartner, NP  calcium carbonate (TUMS - DOSED IN MG ELEMENTAL CALCIUM) 500 MG chewable tablet Chew 1 tablet (200 mg of elemental calcium total) by mouth 3 (three) times daily as needed for indigestion or heartburn. Patient not taking: Reported on 01/03/2023 07/28/22   Dorcas Carrow, MD  clopidogrel (PLAVIX) 75 MG tablet Take 1 tablet (75 mg total) by mouth daily. 03/01/22   Arty Baumgartner, NP  docusate sodium (COLACE) 100 MG capsule Take 1 capsule (100 mg total) by mouth 2 (two) times daily. 07/27/22   Dorcas Carrow, MD  lidocaine (LIDODERM) 5 % Place 1 patch onto the skin daily. Remove & Discard patch within 12 hours or as directed by MD 10/10/22   Almon Hercules, MD  LORazepam (ATIVAN) 0.5 MG tablet Take 0.5 mg by mouth 2 (two) times daily.    [provider]  metoprolol tartrate (LOPRESSOR) 50 MG tablet Take 1 tablet (50 mg total) by mouth 2 (two) times daily. Patient not taking: Reported on 01/03/2023 12/19/21   Wanda Plump, MD  nitroGLYCERIN (NITROSTAT) 0.4 MG SL tablet Place 1 tablet (0.4 mg total) under the tongue every 5 (five) minutes x 3 doses as needed for chest pain. 02/28/22   Arty Baumgartner, NP  Nutritional Supplement LIQD Take 120 mLs by mouth in the morning, at noon, and at bedtime. Med Pass  [provider]  Nutritional Supplements (NUTRITIONAL SUPPLEMENT PO) Take by mouth See admin instructions. Twice a day with lunch and dinner(per MAR)    [provider]  ondansetron (ZOFRAN-ODT) 4 MG disintegrating tablet Take 4 mg by mouth every 8 (eight) hours as needed. Patient not taking: Reported on 01/03/2023 08/24/22   [provider]  oxyCODONE (ROXICODONE) 5 MG immediate release tablet Take 1-2 tablets (5-10 mg total) by mouth every 4 (four) hours as needed for severe pain. Patient not taking: Reported on 01/03/2023 10/30/22   Mesner, Barbara Cower, MD  pantoprazole (PROTONIX) 40 MG tablet  Take 1 tablet (40 mg total) by mouth at bedtime. Patient taking differently: Take 40 mg by mouth daily. 02/28/22   Arty Baumgartner, NP  polyethylene glycol (MIRALAX / GLYCOLAX) 17 g packet Take 17 g by mouth daily. 07/27/22   Dorcas Carrow, MD  rosuvastatin (CRESTOR) 20 MG tablet Take 1 tablet (20 mg total) by mouth daily. Patient taking differently: Take 20 mg by mouth at bedtime. 03/01/22   Arty Baumgartner, NP  sertraline (ZOLOFT) 25 MG tablet Take 25 mg by mouth at bedtime. 06/18/22   [provider]      Allergies    Penicillins, Valsartan, Atorvastatin, Carvedilol, Ezetimibe, Fluvastatin sodium, Magnesium hydroxide, Meloxicam, Pneumovax [pneumococcal polysaccharide vaccine], Quinapril hcl, Simvastatin, Topamax [topiramate], and Vit d-vit e-safflower oil    Review of Systems   Review of Systems Ten systems reviewed and are negative for acute change, except as noted in the HPI.    Physical Exam Updated Vital Signs BP (!) 172/97   Pulse 87   Temp (!) 97.4 F (36.3 C) (Oral)   Resp (!) 24   SpO2 96%   Physical Exam Vitals and nursing note reviewed.  Constitutional:      General: She is not in acute distress.    Appearance: She is well-developed. She is not diaphoretic.     Comments: Elderly, frail appearing  HENT:     Head: Normocephalic and atraumatic.     Comments: No hematoma or contusion to scalp.  No Battle sign or raccoon's eyes. Eyes:     General: No scleral icterus.    Extraocular Movements: EOM normal.     Conjunctiva/sclera: Conjunctivae normal.  Neck:     Comments: C-collar in place Cardiovascular:     Rate and Rhythm: Normal rate.     Pulses: Normal pulses.  Pulmonary:     Effort: Pulmonary effort is normal. No respiratory distress.     Breath sounds: No stridor. No wheezing.     Comments: Respirations even and unlabored. Lungs CTAB. Chest:     Chest wall: Tenderness (midsternal w/o crepitus) present.  Abdominal:     Palpations: Abdomen is  soft.  Musculoskeletal:        General: Normal range of motion.     Comments: No leg shortening or malrotation.  Skin:    General: Skin is warm and dry.     Coloration: Skin is not pale.     Findings: No erythema or rash.  Neurological:     Mental Status: She is alert and oriented to person, place, and time.     Coordination: Coordination normal.  Psychiatric:        Mood and Affect: Mood and affect normal.        Behavior: Behavior normal.     ED Results / Procedures / Treatments   Labs (all labs ordered are listed, but only abnormal results are displayed) Labs  Reviewed  CBC WITH DIFFERENTIAL/PLATELET - Abnormal; Notable for the following components:      Result Value   WBC 12.3 (*)    RBC 5.80 (*)    Hemoglobin 15.4 (*)    HCT 49.3 (*)    Platelets 508 (*)    Neutro Abs 10.4 (*)    All other components within normal limits  BASIC METABOLIC PANEL - Abnormal; Notable for the following components:   Sodium 132 (*)    Potassium 3.4 (*)    Chloride 91 (*)    Glucose, Bld 120 (*)    BUN <5 (*)    All other components within normal limits  PROTIME-INR    EKG None  Radiology DG Lumbar Spine Complete  Result Date: 02/03/2023 CLINICAL DATA:  89 year old female with history of low back pain after a fall. EXAM: LUMBAR SPINE - COMPLETE 4+ VIEW COMPARISON:  No priors. FINDINGS: Multiple views of the lumbar spine demonstrate no definite acute displaced fracture or acute compression type fracture. Multiple chronic appearing compression fractures are noted at T11, L1, L2, L3 and L4, most severe at T11 where there is up to 50% loss of anterior vertebral body height. Post vertebroplasty changes are noted at T11, L1 and L2. Mild levoscoliosis convex to the left at the level of L1. Severe multilevel degenerative disc disease and facet arthropathy is noted throughout the lumbar spine, with most severe facet arthropathy at L4-L5 and L5-S1. IMPRESSION: 1. While there are multiple chronic  appearing compression fractures and post vertebroplasty changes, in addition to extensive multilevel degenerative disc disease and lumbar spondylosis, as above, no definite acute findings are noted. Electronically Signed   By: Trudie Reed M.D.   On: 02/03/2023 05:32   CT HEAD WO CONTRAST ( )  Result Date: 02/03/2023 CLINICAL DATA:  Fall, head and neck trauma, on blood thinners EXAM: CT HEAD WITHOUT CONTRAST CT CERVICAL SPINE WITHOUT CONTRAST TECHNIQUE: Multidetector CT imaging of the head and cervical spine was performed following the standard protocol without intravenous contrast. Multiplanar CT image reconstructions of the cervical spine were also generated. RADIATION DOSE REDUCTION: This exam was performed according to the departmental dose-optimization program which includes automated exposure control, adjustment of the mA and/or kV according to patient size and/or use of iterative reconstruction technique. COMPARISON:  01/23/2023 and 10/30/2022 CT of the thoracic spine FINDINGS: CT HEAD FINDINGS Brain: No evidence of acute infarct, hemorrhage, mass, mass effect, or midline shift. No hydrocephalus or extra-axial fluid collection. Periventricular white matter changes, likely the sequela of chronic small vessel ischemic disease. Remote lacunar infarct in the right thalamus. Vascular: No hyperdense vessel. Atherosclerotic calcifications in the intracranial carotid and vertebral arteries. Skull: Negative for fracture or focal lesion. Sinuses/Orbits: No acute finding. Other: Fluid in left mastoid air cells. CT CERVICAL SPINE FINDINGS Alignment: No traumatic listhesis. Skull base and vertebrae: Osteopenia. Compression fracture of T2 is new compared to 01/23/2023, likely acute. Approximately 40% vertebral body height loss, with 3 mm retropulsion of the posterosuperior cortex. Unchanged compression deformities of C7 and T3. Status post T4 kyphoplasty. Soft tissues and spinal canal: There is likely trace  prevertebral edema anterior to T2. No visible canal hematoma. Disc levels: Degenerative changes in the cervical spine. No significant spinal canal stenosis. Upper chest: Negative. IMPRESSION: 1. No acute intracranial process. 2. Acute compression fracture of T2 with approximately 40% vertebral body height loss and 3 mm retropulsion of the posterosuperior cortex. These findings were discussed by telephone on 02/03/2023 at 4:09 am with provider  Oaklan Persons . Electronically Signed   By: Wiliam Ke M.D.   On: 02/03/2023 04:10   CT Cervical Spine Wo Contrast  Result Date: 02/03/2023 CLINICAL DATA:  Fall, head and neck trauma, on blood thinners EXAM: CT HEAD WITHOUT CONTRAST CT CERVICAL SPINE WITHOUT CONTRAST TECHNIQUE: Multidetector CT imaging of the head and cervical spine was performed following the standard protocol without intravenous contrast. Multiplanar CT image reconstructions of the cervical spine were also generated. RADIATION DOSE REDUCTION: This exam was performed according to the departmental dose-optimization program which includes automated exposure control, adjustment of the mA and/or kV according to patient size and/or use of iterative reconstruction technique. COMPARISON:  01/23/2023 and 10/30/2022 CT of the thoracic spine FINDINGS: CT HEAD FINDINGS Brain: No evidence of acute infarct, hemorrhage, mass, mass effect, or midline shift. No hydrocephalus or extra-axial fluid collection. Periventricular white matter changes, likely the sequela of chronic small vessel ischemic disease. Remote lacunar infarct in the right thalamus. Vascular: No hyperdense vessel. Atherosclerotic calcifications in the intracranial carotid and vertebral arteries. Skull: Negative for fracture or focal lesion. Sinuses/Orbits: No acute finding. Other: Fluid in left mastoid air cells. CT CERVICAL SPINE FINDINGS Alignment: No traumatic listhesis. Skull base and vertebrae: Osteopenia. Compression fracture of T2 is new compared  to 01/23/2023, likely acute. Approximately 40% vertebral body height loss, with 3 mm retropulsion of the posterosuperior cortex. Unchanged compression deformities of C7 and T3. Status post T4 kyphoplasty. Soft tissues and spinal canal: There is likely trace prevertebral edema anterior to T2. No visible canal hematoma. Disc levels: Degenerative changes in the cervical spine. No significant spinal canal stenosis. Upper chest: Negative. IMPRESSION: 1. No acute intracranial process. 2. Acute compression fracture of T2 with approximately 40% vertebral body height loss and 3 mm retropulsion of the posterosuperior cortex. These findings were discussed by telephone on 02/03/2023 at 4:09 am with provider Keivon Garden . Electronically Signed   By: Wiliam Ke M.D.   On: 02/03/2023 04:10   DG Pelvis Portable  Result Date: 02/03/2023 CLINICAL DATA:  Status post fall. EXAM: PORTABLE PELVIS 1-2 VIEWS COMPARISON:  January 23, 2023 FINDINGS: There is no evidence of an acute pelvic fracture or dislocation. Bilateral proximal femoral intramedullary rods and compression screw device is are seen. Chronic bilateral inferior pubic rami fractures are noted. Marked severity degenerative changes are seen within the visualized portion of the lower lumbar spine. IMPRESSION: 1. No acute pelvic fracture or dislocation. 2. Prior ORIF of bilateral proximal femoral fractures, with chronic fractures of the bilateral inferior pubic rami. Electronically Signed   By: Aram Candela M.D.   On: 02/03/2023 03:43   DG Chest Port 1 View  Result Date: 02/03/2023 CLINICAL DATA:  Status post fall. EXAM: PORTABLE CHEST 1 VIEW COMPARISON:  January 23, 2023 FINDINGS: The heart size and mediastinal contours are within normal limits. Low lung volumes are noted with diffuse, chronic appearing increased interstitial lung markings. Mild atelectasis and/or infiltrate is seen within the left lung base. A small left pleural effusion is seen. No  pneumothorax is identified. There are multiple chronic bilateral rib fractures. A chronic fracture deformity is seen involving the head and neck of the proximal right humerus. Multilevel vertebroplasty is noted throughout the thoracic spine. IMPRESSION: 1. Chronic changes with mild left basilar atelectasis and/or infiltrate. 2. Small left pleural effusion. Electronically Signed   By: Aram Candela M.D.   On: 02/03/2023 03:38    Procedures Procedures    Medications Ordered in ED Medications  fentaNYL (SUBLIMAZE)  injection 50 mcg (50 mcg Intravenous Given 02/03/23 0340)  oxyCODONE (Oxy IR/ROXICODONE) immediate release tablet 5 mg (5 mg Oral Given 02/03/23 0513)    ED Course/ Medical Decision Making/ A&P Clinical Course as of 02/03/23 0641  Sun Feb 03, 2023  0409 Patient with acute T2 fracture, new from 01/23/23. No ICH or traumatic intracranial pathology. [KH]  0501 Spoke with NSG, Cosentino PA-C, regarding findings of T2 compression fx with retropulsion. NSG rec's are for pain control and outpatient follow up. No bracing will unfortunately reach this location for stabilization. [KH]    Clinical Course User Index [KH] Antony Madura, PA-C                                 Medical Decision Making Amount and/or Complexity of Data Reviewed Labs: ordered. Radiology: ordered.  Risk Prescription drug management.   This patient presents to the ED for concern of fall, this involves an extensive number of treatment options, and is a complaint that carries with it a high risk of complications and morbidity.  The differential diagnosis includes ICH vs skull fracture vs vertebral fx vs contusion vs PTX vs sprain/strain   Co morbidities that complicate the patient evaluation  CAD HTN dCHF Afib on Eliquis   Additional history obtained:  Additional history obtained from EMS personnel External records from outside source obtained and reviewed including prior fall in May 2024 with T3  compression fx s/p vertebroplasty   Lab Tests:  I Ordered, and personally interpreted labs.  The pertinent results include:  WBC 12.3 (likely traumatic), Hgb 15.4   Imaging Studies ordered:  I ordered imaging studies including CT head, CT C-spine, Xray lumbar spine, chest, and pelvis  I independently visualized and interpreted imaging which showed acute T2 compression fx I agree with the radiologist interpretation   Cardiac Monitoring:  The patient was maintained on a cardiac monitor.  I personally viewed and interpreted the cardiac monitored which showed an underlying rhythm of: NSR   Medicines ordered and prescription drug management:  I ordered medication including Fentanyl and Oxycodone for pain control  I have reviewed the patients home medicines and have made adjustments as needed   Consultations Obtained:  I requested consultation with Cosentino, PA-C of neurosurgery and discussed imaging findings as well as pertinent plan - they recommend: pain control and outpatient f/u if symptoms well managed. No indication for bracing given location of fx.   Problem List / ED Course:  As above Pending pain reassessment and ambulation trial.   Reevaluation:  After the interventions noted above, I reevaluated the patient and found that they have : remained stable   Social Determinants of Health:  SNF resident Ambulatory with assist device   Dispostion:  Care signed out to Le, PA-C at shift change pending reassessment.         Final Clinical Impression(s) / ED Diagnoses Final diagnoses:  Compression fracture of T2 vertebra, initial encounter [facility])  Fall, initial encounter    Rx / DC Orders ED Discharge Orders     None         Antony Madura, PA-C 02/03/23 4696    Gilda Crease, MD 02/03/23 [phone]

## 2023-02-03 NOTE — Assessment & Plan Note (Addendum)
Not on any anti-HTN medication Unsure why these were stopped PRN IV metoprolol

## 2023-02-03 NOTE — Assessment & Plan Note (Addendum)
S/p STEMI in 02/2022 LHC 10/13 notable for 95% calcified ostial RCA lesion. Had staged atherectomy and shockwave lithotripsy with DES x1 placed.  She was initially placed on DAPT with aspirin/Brilinta but given her episode of atrial fibrillation she was placed on plavix -- Continue plavix, Crestor 20 mg daily (no longer on metoprolol, unsure when discontinued)  -has not seen cardiology since this time. Discussed with daughter and importance of making an appointment.  -when seen in ED in August, cardiology was called and started on Imdur.

## 2023-02-03 NOTE — Assessment & Plan Note (Signed)
No signs/symptoms of infection Check UA Likely hemoconcentrated/reactive Gentle IVF Trend

## 2023-02-03 NOTE — ED Notes (Signed)
Pt refused to ambulate with a walker stating she was in too much pain, rated pain 10/10. Pt stated she wanted to wait a few days before she tried to walk again.

## 2023-02-03 NOTE — ED Notes (Signed)
Patient transported to CT on portable monitor by primary RN

## 2023-02-03 NOTE — H&P (Signed)
History and Physical    Patient: [name]:[id number] DOB: 1929-12-23 DOA: 02/03/2023 DOS: the patient was seen and examined on 02/03/2023 PCP: [facility]  Patient coming from: SNF at [facility]. Uses walker.    Chief Complaint: fall  HPI: [name] is a 89 y.o. female with medical history significant of CAD, diastolic CHF, hx of CVA, GERD, polycythemia,  hx of breast cancer s/p left lumpectomy, PAF on eliquis, depression and anxiety, frequent falls, hx of DVT and PE, chronic respiratory failure who presented to ED after a fall. She states she was walking from kitchen to the front door and guessed she lost her balance and fell onto her left side. She states she didn't really hit her head like last time. Her daughter states she doesn't think she is picking her feet up and gets tangled up and falls.   Denies any fever/chills, vision changes/headaches, chest pain or palpitations, + shortness of breath or cough, abdominal pain, N/V/D, dysuria or leg swelling.   She does not smoke or drink alcohol.   ER Course:  vitals: afebrile, bp: 188/102, HR: 90,  RR: 20, oxygen: 97% on 2L [place] Pertinent labs: wbc: 12.3, hgb: 15.4, hct: 49.3, platelets 508, sodium: 132, potassiuM: 3.4,  CXR: chronic changes. Mild left basilar atelectasis and/or infiltrate. Small left pleural effusion.  Pelvis xray: no acute fracture. Prior ORIF. Chronic fractures of bilateral inferior pubic rami.  CT head: no acute finding CT cervical spine: acute compression fracture of T2 with app. 40% vertebral body height loss and 3mm retropulsion of the posteriorsuperior cortex.  Lumbar xray: chronic findings, no acute findings.  In ED: neurosurgery consulted. Pain control and f/u outpatient. Given pain medication. TRH asked to admit.    Review of Systems: As mentioned in the history of present illness. All other systems reviewed and are negative. Past Medical History:  Diagnosis Date   CAD  (coronary artery disease)    mild nonobstructive disease on cath in 2003   Cancer [facility])    Cardiomyopathy    Probable Takotsubo, severe CP w/ normal cath in 1994. Severe CP in 2003 w/ widespread T wave inversions on ECG. Cath w/ minimal coronary disease but LV-gram showed periapical severe hypokinesis and basilar hyperkinesia (EF 40%). Last echo in 4/09 confirmed full LV functional recovery with EF 60%, no regional wall motion abnormalities, mild to moderate LVH.   Chronic diastolic CHF (congestive heart failure) (HCC) 09/13/2019   CVA (cerebral infarction)    Depression with anxiety 03/29/2011   lost husband 3'09   DVT (deep venous thrombosis) (HCC)    after venous ablation, R leg   Fracture 09/10/2007   L2, status post vertebroplasty of L2 performed by IR   GERD (gastroesophageal reflux disease)    Hemorrhoids    Hiatal hernia 03/29/2011   no nerve problems   Hyperlipidemia    Hyperplastic colon polyp 06/2001   Hypertension    Iron deficiency anemia due to chronic blood loss 12/30/2017   OA (osteoarthritis) of knee 03/29/2011   w/ bilateral knee pain-not a problem now   OSA (obstructive sleep apnea) 03/29/2011   no cpap used, not a problem now.   Osteoporosis    Otalgia of both ears    Dr. Emeline Darling   Polycythemia    Stroke [facility]) 03/29/2011   CVA x2 -last 10'12-?TIA(visual problems)   Varicose vein    Past Surgical History:  Procedure Laterality Date   APPENDECTOMY     BACK  SURGERY     BREAST BIOPSY     BREAST CYST EXCISION     BREAST LUMPECTOMY Left 2013   left stage I left breast cancer   CHOLECYSTECTOMY  04/09/2011   Procedure: LAPAROSCOPIC CHOLECYSTECTOMY WITH INTRAOPERATIVE CHOLANGIOGRAM;  Surgeon: Adolph Pollack, MD;  Location: [facility] ORS;  Service: General;  Laterality: N/A;   CORONARY ATHERECTOMY N/A 02/26/2022   Procedure: CORONARY ATHERECTOMY;  Surgeon: Yvonne Kendall, MD;  Location: [facility] INVASIVE CV LAB;  Service: Cardiovascular;  Laterality: N/A;   CORONARY  LITHOTRIPSY N/A 02/26/2022   Procedure: CORONARY LITHOTRIPSY;  Surgeon: Yvonne Kendall, MD;  Location: [facility] INV[facility] CV [facility];  Service: Cardiovascular;  Laterality: N/A;   CORONARY STENT INTERVENTION N/A 02/26/2022   Procedure: CORONARY STENT INTERVENTION;  Surgeon: Yvonne Kendall, MD;  Location: [facility] CV [facility];  Service: Cardiovascular;  Laterality: N/A;   CORONARY ULTRASOUND/IVUS N/A 02/26/2022   Procedure: Intravascular Ultrasound/IVUS;  Surgeon: Yvonne Kendall, MD;  Location: [facility];  Service: Cardiovascular;  Laterality: N/A;   Dental Extraction     L maxillary molar   FEMUR IM NAIL Right 07/25/2022   Procedure: INTRAMEDULLARY (IM) NAIL FEMORAL;  Surgeon: Yolonda Kida, MD;  Location: [facility] OR;  Service: Orthopedics;  Laterality: Right;  hana table,carm.synthesis   INTRAMEDULLARY (IM) NAIL INTERTROCHANTERIC Left 12/25/2019   Procedure: INTRAMEDULLARY (IM) NAIL INTERTROCHANTRIC;  Surgeon: Durene Romans, MD;  Location: [facility];  Service: Orthopedics;  Laterality: Left;   IR KYPHO THORACIC WITH BONE BIOPSY  09/15/2019   IR KYPHO THORACIC WITH BONE BIOPSY  07/13/2020       IR KYPHO THORACIC WITH BONE BIOPSY  07/13/2020   IR VERTEBROPLASTY CERV/THOR BX INC UNI/BIL INC/INJECT/IMAGING  11/10/2019   KYPHOPLASTY N/A 11/16/2020   Procedure: T11,  L1,KYPHOPLASTY;  Surgeon: Venita Lick, MD;  Location: [facility];  Service: Orthopedics;  Laterality: N/A;   KYPHOSIS SURGERY  08/2007   cement used   LEFT HEART CATH AND CORONARY ANGIOGRAPHY N/A 02/23/2022   Procedure: LEFT HEART CATH AND CORONARY ANGIOGRAPHY;  Surgeon: [place], Peter M, MD;  Location: [facility] INVASIVE CV LAB;  Service: Cardiovascular;  Laterality: N/A;   LEFT HEART CATH AND CORONARY ANGIOGRAPHY N/A 02/26/2022   Procedure: LEFT HEART CATH AND CORONARY ANGIOGRAPHY;  Surgeon: Yvonne Kendall, MD;  [facility]: [facility];  Service: Cardiovascular;  Laterality: N/A;   OVARIAN CYST SURGERY     left   RADIOLOGY WITH ANESTHESIA N/A  11/10/2019   Procedure: VERTEBROPLASTY;  Surgeon: Julieanne Cotton, MD;  Location: [facility];  Service: Radiology;  Laterality: N/A;   RADIOLOGY WITH ANESTHESIA N/A 10/04/2022   Procedure: T3 vertebroplasty;  Surgeon: Julieanne Cotton, MD;  Location: [facility];  Service: Radiology;  Laterality: N/A;   TEMPORARY PACEMAKER N/A 02/26/2022   Procedure: TEMPORARY PACEMAKER;  Surgeon: Yvonne Kendall, MD;  Location: [facility];  Service: Cardiovascular;  Laterality: N/A;   TONSILLECTOMY     TOTAL KNEE ARTHROPLASTY  06/2010   left   TUBAL LIGATION     Social History:  reports that she has been smoking cigarettes. She started smoking about 71 years ago. She has a 71.6 pack-year smoking history. She has never used smokeless tobacco. She reports that she does not currently use alcohol after a past usage of about 1.0 standard drink of alcohol per week. She reports that she does not use drugs.  Allergies  Allergen Reactions   Penicillins Hives, Rash and Other (See Comments)    Has patient had a PCN reaction  causing immediate rash, facial/tongue/throat swelling, SOB or lightheadedness with hypotension: Yes Has patient had a PCN reaction causing severe rash involving mucus membranes or skin necrosis: yes Has patient had a PCN reaction that required hospitalization: No Has patient had a PCN reaction occurring within the last 10 years: no If all of the above answers are "NO", then may proceed with Cephalosporin use.  Other Reaction: OTHER REACTION   Valsartan Itching   Atorvastatin Diarrhea   Carvedilol Other (See Comments)    "teeth hurt when I took it"    Ezetimibe Nausea Only   Fluvastatin Sodium Other (See Comments)    "Can't remember"   Magnesium Hydroxide Other (See Comments)    REACTION: triggers HAs   Meloxicam Other (See Comments)    Pt seeing auro's / spots.     Pneumovax [Pneumococcal Polysaccharide Vaccine] Other (See Comments)    Local reaction    Quinapril Hcl Other (See  Comments)     "feelings of tiredness"    Simvastatin Diarrhea   Topamax [Topiramate] Itching   Vit D-Vit E-Safflower Oil Other (See Comments)    Headaches     Family History  Problem Relation Age of Onset   Breast cancer Mother    Heart disease Father        MIs   Colon cancer Other        cousin   Breast cancer Sister    Leukemia Brother        GM    Prior to Admission medications   Medication Sig Start Date End Date Taking? Authorizing Provider  azithromycin (ZITHROMAX) 250 MG tablet Take 250 mg by mouth as directed. 01/28/23  Yes [provider]  furosemide (LASIX) 20 MG tablet Take 20 mg by mouth daily. 01/29/23  Yes [provider]  HYDROcodone-acetaminophen (NORCO/VICODIN) 5-325 MG tablet Take 1 tablet by mouth 3 (three) times daily as needed. 01/28/23  Yes [provider]  acetaminophen (TYLENOL) 500 MG tablet Take 1,000 mg by mouth every 8 (eight) hours as needed (for pain).    [provider]  alendronate (FOSAMAX) 70 MG tablet Take 1 tablet (70 mg total) by mouth once a week. Take with a full glass of water on an empty stomach. 12/19/21   Wanda Plump, MD  amiodarone (PACERONE) 200 MG tablet Take 200 mg by mouth daily. Patient not taking: Reported on 01/03/2023 06/18/22   [provider]  amLODipine (NORVASC) 5 MG tablet Take 1 tablet (5 mg total) by mouth daily. Patient not taking: Reported on 01/03/2023 12/19/21   Wanda Plump, MD  apixaban (ELIQUIS) 2.5 MG TABS tablet Take 1 tablet (2.5 mg total) by mouth 2 (two) times daily. Patient taking differently: Take 2.5 mg by mouth daily. 02/28/22   Arty Baumgartner, NP  calcium carbonate (TUMS - DOSED IN MG ELEMENTAL CALCIUM) 500 MG chewable tablet Chew 1 tablet (200 mg of elemental calcium total) by mouth 3 (three) times daily as needed for indigestion or heartburn. Patient not taking: Reported on 01/03/2023 07/28/22   Dorcas Carrow, MD  clopidogrel (PLAVIX) 75 MG tablet Take 1 tablet (75  mg total) by mouth daily. 03/01/22   Arty Baumgartner, NP  docusate sodium (COLACE) 100 MG capsule Take 1 capsule (100 mg total) by mouth 2 (two) times daily. 07/27/22   Dorcas Carrow, MD  lidocaine (LIDODERM) 5 % Place 1 patch onto the skin daily. Remove & Discard patch within 12 hours or as directed by MD 10/10/22  Almon Hercules, MD  LORazepam (ATIVAN) 0.5 MG tablet Take 0.5 mg by mouth 2 (two) times daily.    [provider]  metoprolol tartrate (LOPRESSOR) 50 MG tablet Take 1 tablet (50 mg total) by mouth 2 (two) times daily. Patient not taking: Reported on 01/03/2023 12/19/21   Wanda Plump, MD  nitroGLYCERIN (NITROSTAT) 0.4 MG SL tablet Place 1 tablet (0.4 mg total) under the tongue every 5 (five) minutes x 3 doses as needed for chest pain. 02/28/22   Arty Baumgartner, NP  Nutritional Supplement LIQD Take 120 mLs by mouth in the morning, at noon, and at bedtime. Med Pass    [provider]  Nutritional Supplements (NUTRITIONAL SUPPLEMENT PO) Take by mouth See admin instructions. Twice a day with lunch and dinner(per MAR)    [provider]  ondansetron (ZOFRAN-ODT) 4 MG disintegrating tablet Take 4 mg by mouth every 8 (eight) hours as needed. Patient not taking: Reported on 01/03/2023 08/24/22   [provider]  oxyCODONE (ROXICODONE) 5 MG immediate release tablet Take 1-2 tablets (5-10 mg total) by mouth every 4 (four) hours as needed for severe pain. Patient not taking: Reported on 01/03/2023 10/30/22   Mesner, Barbara Cower, MD  pantoprazole (PROTONIX) 40 MG tablet Take 1 tablet (40 mg total) by mouth at bedtime. Patient taking differently: Take 40 mg by mouth daily. 02/28/22   Arty Baumgartner, NP  polyethylene glycol (MIRALAX / GLYCOLAX) 17 g packet Take 17 g by mouth daily. 07/27/22   Dorcas Carrow, MD  rosuvastatin (CRESTOR) 20 MG tablet Take 1 tablet (20 mg total) by mouth daily. Patient taking differently: Take 20 mg by mouth at bedtime. 03/01/22   Arty Baumgartner, NP  sertraline (ZOLOFT) 25 MG tablet Take 25 mg by mouth at bedtime. 06/18/22   [provider]    Physical Exam: Vitals:   02/03/23 0715 02/03/23 0739 02/03/23 0800 02/03/23 1142  BP: (!) 157/97  (!) 145/88 (!) 169/86  Pulse: 85  84 77  Resp: 16  19 18   Temp:  97.8 F (36.6 C)  98.2 F (36.8 C)  TempSrc:  Oral  Oral  SpO2: 97%  97% 100%   General:  Appears calm and comfortable and is in NAD. Frail and cachetic appearing  Eyes:  PERRL, EOMI, normal lids, iris ENT:  HOH lips & tongue, mmm; edentulous bottom, dentures top  Neck:  no LAD, masses or thyromegaly; no carotid bruits Cardiovascular:  RRR, no m/r/g. No LE edema.  Respiratory:   CTA bilaterally with no wheezes/rales/rhonchi.  Normal respiratory effort. Abdomen:  soft, NT, ND, NABS. Reducible ventral hernia  Back:   normal alignment, no CVAT Skin:  no rash or induration seen on limited exam. Bruising on extremities.  Musculoskeletal:  grossly normal tone BUE/BLE. Global weakness > in BLE, good ROM, no bony abnormality. Full ROM in neck.  Lower extremity:  No LE edema.  Limited foot exam with no ulcerations.  2+ distal pulses. Psychiatric:  grossly normal mood and affect, speech fluent and appropriate, AOx3 Neurologic:  CN 2-12 grossly intact, moves all extremities in coordinated fashion, sensation intact   Radiological Exams on Admission: Independently reviewed - see discussion in A/P where applicable  DG Lumbar Spine Complete  Result Date: 02/03/2023 CLINICAL DATA:  89 year old female with history of low back pain after a fall. EXAM: LUMBAR SPINE - COMPLETE 4+ VIEW COMPARISON:  No priors. FINDINGS: Multiple views of the lumbar spine demonstrate no definite acute displaced fracture  or acute compression type fracture. Multiple chronic appearing compression fractures are noted at T11, L1, L2, L3 and L4, most severe at T11 where there is up to 50% loss of anterior vertebral body height. Post vertebroplasty  changes are noted at T11, L1 and L2. Mild levoscoliosis convex to the left at the level of L1. Severe multilevel degenerative disc disease and facet arthropathy is noted throughout the lumbar spine, with most severe facet arthropathy at L4-L5 and L5-S1. IMPRESSION: 1. While there are multiple chronic appearing compression fractures and post vertebroplasty changes, in addition to extensive multilevel degenerative disc disease and lumbar spondylosis, as above, no definite acute findings are noted. Electronically Signed   By: Trudie Reed M.D.   On: 02/03/2023 05:32   CT HEAD WO CONTRAST ( )  Result Date: 02/03/2023 CLINICAL DATA:  Fall, head and neck trauma, on blood thinners EXAM: CT HEAD WITHOUT CONTRAST CT CERVICAL SPINE WITHOUT CONTRAST TECHNIQUE: Multidetector CT imaging of the head and cervical spine was performed following the standard protocol without intravenous contrast. Multiplanar CT image reconstructions of the cervical spine were also generated. RADIATION DOSE REDUCTION: This exam was performed according to the departmental dose-optimization program which includes automated exposure control, adjustment of the mA and/or kV according to patient size and/or use of iterative reconstruction technique. COMPARISON:  01/23/2023 and 10/30/2022 CT of the thoracic spine FINDINGS: CT HEAD FINDINGS Brain: No evidence of acute infarct, hemorrhage, mass, mass effect, or midline shift. No hydrocephalus or extra-axial fluid collection. Periventricular white matter changes, likely the sequela of chronic small vessel ischemic disease. Remote lacunar infarct in the right thalamus. Vascular: No hyperdense vessel. Atherosclerotic calcifications in the intracranial carotid and vertebral arteries. Skull: Negative for fracture or focal lesion. Sinuses/Orbits: No acute finding. Other: Fluid in left mastoid air cells. CT CERVICAL SPINE FINDINGS Alignment: No traumatic listhesis. Skull base and vertebrae: Osteopenia.  Compression fracture of T2 is new compared to 01/23/2023, likely acute. Approximately 40% vertebral body height loss, with 3 mm retropulsion of the posterosuperior cortex. Unchanged compression deformities of C7 and T3. Status post T4 kyphoplasty. Soft tissues and spinal canal: There is likely trace prevertebral edema anterior to T2. No visible canal hematoma. Disc levels: Degenerative changes in the cervical spine. No significant spinal canal stenosis. Upper chest: Negative. IMPRESSION: 1. No acute intracranial process. 2. Acute compression fracture of T2 with approximately 40% vertebral body height loss and 3 mm retropulsion of the posterosuperior cortex. These findings were discussed by telephone on 02/03/2023 at 4:09 am with provider KELLY HUMES . Electronically Signed   By: Wiliam Ke M.D.   On: 02/03/2023 04:10   CT Cervical Spine Wo Contrast  Result Date: 02/03/2023 CLINICAL DATA:  Fall, head and neck trauma, on blood thinners EXAM: CT HEAD WITHOUT CONTRAST CT CERVICAL SPINE WITHOUT CONTRAST TECHNIQUE: Multidetector CT imaging of the head and cervical spine was performed following the standard protocol without intravenous contrast. Multiplanar CT image reconstructions of the cervical spine were also generated. RADIATION DOSE REDUCTION: This exam was performed according to the departmental dose-optimization program which includes automated exposure control, adjustment of the mA and/or kV according to patient size and/or use of iterative reconstruction technique. COMPARISON:  01/23/2023 and 10/30/2022 CT of the thoracic spine FINDINGS: CT HEAD FINDINGS Brain: No evidence of acute infarct, hemorrhage, mass, mass effect, or midline shift. No hydrocephalus or extra-axial fluid collection. Periventricular white matter changes, likely the sequela of chronic small vessel ischemic disease. Remote lacunar infarct in the right thalamus. Vascular: No hyperdense  vessel. Atherosclerotic calcifications in the  intracranial carotid and vertebral arteries. Skull: Negative for fracture or focal lesion. Sinuses/Orbits: No acute finding. Other: Fluid in left mastoid air cells. CT CERVICAL SPINE FINDINGS Alignment: No traumatic listhesis. Skull base and vertebrae: Osteopenia. Compression fracture of T2 is new compared to 01/23/2023, likely acute. Approximately 40% vertebral body height loss, with 3 mm retropulsion of the posterosuperior cortex. Unchanged compression deformities of C7 and T3. Status post T4 kyphoplasty. Soft tissues and spinal canal: There is likely trace prevertebral edema anterior to T2. No visible canal hematoma. Disc levels: Degenerative changes in the cervical spine. No significant spinal canal stenosis. Upper chest: Negative. IMPRESSION: 1. No acute intracranial process. 2. Acute compression fracture of T2 with approximately 40% vertebral body height loss and 3 mm retropulsion of the posterosuperior cortex. These findings were discussed by telephone on 02/03/2023 at 4:09 am with provider KELLY HUMES . Electronically Signed   By: Wiliam Ke M.D.   On: 02/03/2023 04:10   DG Pelvis Portable  Result Date: 02/03/2023 CLINICAL DATA:  Status post fall. EXAM: PORTABLE PELVIS 1-2 VIEWS COMPARISON:  January 23, 2023 FINDINGS: There is no evidence of an acute pelvic fracture or dislocation. Bilateral proximal femoral intramedullary rods and compression screw device is are seen. Chronic bilateral inferior pubic rami fractures are noted. Marked severity degenerative changes are seen within the visualized portion of the lower lumbar spine. IMPRESSION: 1. No acute pelvic fracture or dislocation. 2. Prior ORIF of bilateral proximal femoral fractures, with chronic fractures of the bilateral inferior pubic rami. Electronically Signed   By: Aram Candela M.D.   On: 02/03/2023 03:43   DG Chest Port 1 View  Result Date: 02/03/2023 CLINICAL DATA:  Status post fall. EXAM: PORTABLE CHEST 1 VIEW COMPARISON:   January 23, 2023 FINDINGS: The heart size and mediastinal contours are within normal limits. Low lung volumes are noted with diffuse, chronic appearing increased interstitial lung markings. Mild atelectasis and/or infiltrate is seen within the left lung base. A small left pleural effusion is seen. No pneumothorax is identified. There are multiple chronic bilateral rib fractures. A chronic fracture deformity is seen involving the head and neck of the proximal right humerus. Multilevel vertebroplasty is noted throughout the thoracic spine. IMPRESSION: 1. Chronic changes with mild left basilar atelectasis and/or infiltrate. 2. Small left pleural effusion. Electronically Signed   By: Aram Candela M.D.   On: 02/03/2023 03:38    EKG: Independently reviewed. Sinus tachy with rate 105; nonspecific ST changes with no evidence of acute ischemia with PVC    Labs on Admission: I have personally reviewed the available labs and imaging studies at the time of the admission.  Pertinent labs:   wbc: 12.3, hgb: 15.4,  hct: 49.3, platelets 508,  sodium: 132,  potassiuM: 3.4  Assessment and Plan: Principal Problem:   Compression fracture of T2 vertebra (HCC) Active Problems:   Hypokalemia   Leukocytosis   Chronic respiratory failure with hypoxia (HCC)   Coronary artery disease involving native coronary artery of native heart with unstable angina pectoris (HCC)   Chronic diastolic CHF (congestive heart failure) (HCC)   Polycythemia   Paroxysmal atrial fibrillation (HCC)   Essential hypertension   GERD (gastroesophageal reflux disease)   Depression with anxiety    Assessment and Plan: * Compression fracture of T2 vertebra (HCC) 89 year old presenting to ED after fall at home found to have acute T2 compression fracture with approximately 40% vertebral height loss and 3mm retropulsion of the  posterior superior cortex.  -observation to tele -neurosurgery consulted. Recommended follow up outpatient.  No acute surgical intervention per ED provider -pain could not be controlled, refused to walk  -hx of multiple falls with recent admit for SDH with T3 and C7 compression fractures. T3 kyphoplasty that was attempted and aborted on 5/23 as patient was not able to tolerate c-collar removal, lay prone, maintain adequate saturation in addition to difficulty visualizing target for the procedure. Per NIR, patient is not a candidate for percutaneous kyphoplasty. Per daughter patient actually refused this procedure.  -pain control with scheduled tylenol 1000mg  TID x 3 days -oxycodone 5mg  q8 hour prn moderate to severe pain -lidocaine patch -discuss with IR if candidate for kyphoplasty if pain continues as daughter states she refused last time and thinks she would be willing to do it this time if candidate as had immediate relief in the past. Can not really gauge pain as she doesn't want to move right now.  -PT/OT.  -will return to SNF -on eliquis, continues to have falls.   Hypokalemia Mild. Check magnesium Replete and follow   Leukocytosis No signs/symptoms of infection Check UA Likely hemoconcentrated/reactive Gentle IVF Trend   Chronic respiratory failure with hypoxia (HCC) On 2-3L home oxygen PRN  Was discharged from hospital in 09/2022 on oxygen and was weaned. Uses PRN  Per daughter was just treated for pneumonia in SNF recently based off xray, but has no cough, shortness of breath or fever. They did an xray for pain only.  Hx of OSA not not on cpap  Hx of large hiatal hernia Uses PRN, appears at baseline   Coronary artery disease involving native coronary artery of native heart with unstable angina pectoris (HCC) S/p STEMI in 02/2022 LHC 10/13 notable for 95% calcified ostial RCA lesion. Had staged atherectomy and shockwave lithotripsy with DES x1 placed.  She was initially placed on DAPT with aspirin/Brilinta but given her episode of atrial fibrillation she was placed on plavix --  Continue plavix, Crestor 20 mg daily (no longer on metoprolol, unsure when discontinued)  -has not seen cardiology since this time. Discussed with daughter and importance of making an appointment.  -when seen in ED in August, cardiology was called and started on Imdur.   Chronic diastolic CHF (congestive heart failure) (HCC) Euvolemic to dry appearing  Echo 09/2022: Unchanged hypokinesis of the inferior/inferolateral/inferoseptal base-mid wall EF 50-55%. There is severe asymmetric left ventricular hypertrophy of the basal-septal segment. Grade 1 DD.  -strict I/O and daily weights -gentle time limited Ivf -continue  medical management with lorpessor, lasix   Polycythemia Follows with oncology  Phlebotomy to maintain hematocrit below 45% Hct >45%, likely hemoconcentrated. Gentle IVF and repeat cbc in AM, if remains elevated consult hematology vs phlebotomy.  IV Iron as needed   Paroxysmal atrial fibrillation (HCC) In NSR No longer on amiodarone or lopressor. Has not seen cardiology. Unsure why stopped.  Continue eliquis  High fall risk   Essential hypertension Not on any anti-HTN medication Unsure why these were stopped PRN IV metoprolol   GERD (gastroesophageal reflux disease) Continue daily PPI   Depression with anxiety Continue with zoloft nightly and ativan BID     Advance Care Planning:   Code Status: Limited: Do not attempt resuscitation (DNR) -DNR-LIMITED -Do Not Intubate/DNI    Consults: neurosurgery, PT/OT   DVT Prophylaxis: eliquis   Family Communication: updated her daughter by phone   Severity of Illness: The appropriate patient status for this patient is OBSERVATION. Observation status  is judged to be reasonable and necessary in order to provide the required intensity of service to ensure the patient's safety. The patient's presenting symptoms, physical exam findings, and initial radiographic and laboratory data in the context of their medical condition is felt  to place them at decreased risk for further clinical deterioration. Furthermore, it is anticipated that the patient will be medically stable for discharge from the hospital within 2 midnights of admission.   Author: Orland Mustard, MD 02/03/2023 2:30 PM  For on call review [email or web address].

## 2023-02-03 NOTE — Assessment & Plan Note (Signed)
Continue daily PPI.

## 2023-02-03 NOTE — Assessment & Plan Note (Addendum)
Euvolemic to dry appearing  Echo 09/2022: Unchanged hypokinesis of the inferior/inferolateral/inferoseptal base-mid wall EF 50-55%. There is severe asymmetric left ventricular hypertrophy of the basal-septal segment. Grade 1 DD.  -strict I/O and daily weights -gentle time limited Ivf -continue  medical management with lorpessor, lasix

## 2023-02-03 NOTE — Assessment & Plan Note (Addendum)
On 2-3L home oxygen PRN  Was discharged from hospital in 09/2022 on oxygen and was weaned. Uses PRN  Per daughter was just treated for pneumonia in SNF recently based off xray, but has no cough, shortness of breath or fever. They did an xray for pain only.  Hx of OSA not not on cpap  Hx of large hiatal hernia Uses PRN, appears at baseline

## 2023-02-03 NOTE — Assessment & Plan Note (Signed)
Mild. Check magnesium Replete and follow

## 2023-02-03 NOTE — ED Notes (Addendum)
IP provider, Dr. Artis Flock at bedside.

## 2023-02-03 NOTE — Assessment & Plan Note (Signed)
Continue with zoloft nightly and ativan BID

## 2023-02-03 NOTE — ED Notes (Signed)
ED TO INPATIENT HANDOFF REPORT  ED Nurse Name and Phone #: Osvaldo Shipper RN [phone]  S Name/Age/Gender [name] 89 y.o. female Room/Bed: 009C/009C  Code Status   Code Status: Limited: Do not attempt resuscitation (DNR) -DNR-LIMITED -Do Not Intubate/DNI   Home/SNF/Other Skilled nursing facility Patient oriented to: self, place, time, and situation Is this baseline? Yes   Triage Complete: Triage complete  Chief Complaint Compression fracture of T2 vertebra (HCC) [S22.020A]  Triage Note Pt BIB [facility] from [facility]. C/o fall on thinners, unwitnessed mechanical fall. Denies LOC, but did hit head. Wearing C-collar. Pain around sternum, L hip, and back.   Takes eliquis for afib.   [facility] VS: 190/100, 95 % 2-3L 02 baseline   Allergies Allergies  Allergen Reactions   Penicillins Hives, Rash and Other (See Comments)    Has patient had a PCN reaction causing immediate rash, facial/tongue/throat swelling, SOB or lightheadedness with hypotension: Yes Has patient had a PCN reaction causing severe rash involving mucus membranes or skin necrosis: yes Has patient had a PCN reaction that required hospitalization: No Has patient had a PCN reaction occurring within the last 10 years: no If all of the above answers are "NO", then may proceed with Cephalosporin use.  Other Reaction: OTHER REACTION   Valsartan Itching and Other (See Comments)   Atorvastatin Diarrhea   Carvedilol Other (See Comments)    "teeth hurt when I took it"    Ezetimibe Nausea Only   Fluvastatin Sodium Other (See Comments)    "Can't remember"   Magnesium Hydroxide Other (See Comments)    REACTION: triggers HAs   Meloxicam Other (See Comments)    Pt seeing auro's / spots.     Pneumovax [Pneumococcal Polysaccharide Vaccine] Other (See Comments)    Local reaction   Quinapril Hcl Other (See Comments)     "feelings of tiredness"   Simvastatin Diarrhea   Topamax [Topiramate] Itching   Vit D-Vit  E-Safflower Oil Other (See Comments)    Headaches    Level of Care/Admitting Diagnosis ED Disposition     ED Disposition  Admit   Condition  --   Comment  Hospital Area: [facility] [[id number]]  Level of Care: Telemetry Medical [104]  May place patient in observation at [facility] or [facility] if equivalent level of care is available:: No  Covid Evaluation: Asymptomatic - no recent exposure (last 10 days) testing not required  Diagnosis: Compression fracture of T2 vertebra [facility]) [[id number]]  Admitting Physician: Orland Mustard [[id number]]  Attending Physician: Orland Mustard [[id number]]          B Medical/Surgery History Past Medical History:  Diagnosis Date   CAD (coronary artery disease)    mild nonobstructive disease on cath in 2003   Cancer [facility])    Cardiomyopathy    Probable Takotsubo, severe CP w/ normal cath in 1994. Severe CP in 2003 w/ widespread T wave inversions on ECG. Cath w/ minimal coronary disease but LV-gram showed periapical severe hypokinesis and basilar hyperkinesia (EF 40%). Last echo in 4/09 confirmed full LV functional recovery with EF 60%, no regional wall motion abnormalities, mild to moderate LVH.   Chronic diastolic CHF (congestive heart failure) (HCC) 09/13/2019   CVA (cerebral infarction)    Depression with anxiety 03/29/2011   lost husband 3'09   DVT (deep venous thrombosis) (HCC)    after venous ablation, R leg   Fracture 09/10/2007   L2, status post vertebroplasty of L2 performed by IR  GERD (gastroesophageal reflux disease)    Hemorrhoids    Hiatal hernia 03/29/2011   no nerve problems   Hyperlipidemia    Hyperplastic colon polyp 06/2001   Hypertension    Iron deficiency anemia due to chronic blood loss 12/30/2017   OA (osteoarthritis) of knee 03/29/2011   w/ bilateral knee pain-not a problem now   OSA (obstructive sleep apnea) 03/29/2011   no cpap used, not a problem now.   Osteoporosis    Otalgia of both ears    Dr.  Emeline Darling   Polycythemia    Stroke [facility]) 03/29/2011   CVA x2 -last 10'12-?TIA(visual problems)   Varicose vein    Past Surgical History:  Procedure Laterality Date   APPENDECTOMY     BACK SURGERY     BREAST BIOPSY     BREAST CYST EXCISION     BREAST LUMPECTOMY Left 2013   left stage I left breast cancer   CHOLECYSTECTOMY  04/09/2011   Procedure: LAPAROSCOPIC CHOLECYSTECTOMY WITH INTRAOPERATIVE CHOLANGIOGRAM;  Surgeon: Adolph Pollack, MD;  Location: [facility] ORS;  Service: General;  Laterality: N/A;   CORONARY ATHERECTOMY N/A 02/26/2022   Procedure: CORONARY ATHERECTOMY;  Surgeon: Yvonne Kendall, MD;  Location: [facility] INVASIVE CV LAB;  Service: Cardiovascular;  Laterality: N/A;   CORONARY LITHOTRIPSY N/A 02/26/2022   Procedure: CORONARY LITHOTRIPSY;  Surgeon: Yvonne Kendall, MD;  Location: [facility] INVASIVE CV LAB;  Service: Cardiovascular;  Laterality: N/A;   CORONARY STENT INTERVENTION N/A 02/26/2022   Procedure: CORONARY STENT INTERVENTION;  Surgeon: Yvonne Kendall, MD;  Location: [facility] INVASIVE CV LAB;  Service: Cardiovascular;  Laterality: N/A;   CORONARY ULTRASOUND/IVUS N/A 02/26/2022   Procedure: Intravascular Ultrasound/IVUS;  Surgeon: Yvonne Kendall, MD;  Location: [facility] INVAS[facility] CV LAB;  Service: Cardiovascular;  Laterality: N/A;   Dental Extraction     L maxillary molar   FEMUR IM NAIL Right 07/25/2022   Procedure: INTRAMEDULLARY (IM) NAIL FEMORAL;  Surgeon: Yolonda Kida, MD;  Location: [facility] OR;  Service: Orthopedics;  Laterality: Right;  hana table,carm.synthesis   INTRAMEDULLARY (IM) NAIL INTERTROCHANTERIC Left 12/25/2019   Procedure: INTRAMEDULLARY (IM) NAIL INTERTROCHANTRIC;  Surgeon: Durene Romans, MD;  Location: [facility] ORS;  Service: Orthopedics;  Laterality: Left;   IR KYPHO THORACIC WITH BONE BIOPSY  09/15/2019   IR KYPHO THORACIC WITH BONE BIOPSY  07/13/2020       IR KYPHO THORACIC WITH BONE BIOPSY  07/13/2020   IR VERTEBROPLASTY CERV/THOR BX INC UNI/BIL INC/INJECT/IMAGING  11/10/2019    KYPHOPLASTY N/A 11/16/2020   Procedure: T11,  L1,KYPHOPLASTY;  Surgeon: Venita Lick, MD;  Location: [facility] OR;  Service: Orthopedics;  Laterality: N/A;   KYPHOSIS SURGERY  08/2007   cement used   LEFT HEART CATH AND CORONARY ANGIOGRAPHY N/A 02/23/2022   Procedure: LEFT HEART CATH AND CORONARY ANGIOGRAPHY;  Surgeon: Swaziland, Peter M, MD;  Location: [facility] INVASIVE CV LAB;  Service: Cardiovascular;  Laterality: N/A;   LEFT HEART CATH AND CORONARY ANGIOGRAPHY N/A 02/26/2022   Procedure: LEFT HEART CATH AND CORONARY ANGIOGRAPHY;  Surgeon: Yvonne Kendall, MD;  [facility];  Service: Cardiovascular;  Laterality: N/A;   OVARIAN CYST SURGERY     left   RADIOLOGY WITH ANESTHESIA N/A 11/10/2019   Procedure: VERTEBROPLASTY;  Surgeon: Julieanne Cotton, MD;  Location: [facility] OR;  Service: Radiology;  Laterality: N/A;   RADIOLOGY WITH ANESTHESIA N/A 10/04/2022   Procedure: T3 vertebroplasty;  Surgeon: Julieanne Cotton, MD;  Location: [facility] OR;  Service: Radiology;  Laterality: N/A;   TEMPORARY PACEMAKER N/A  02/26/2022   Procedure: TEMPORARY PACEMAKER;  Surgeon: Yvonne Kendall, MD;  [facility]: [facility] CV LAB;  Service: Cardiovascular;  Laterality: N/A;   TONSILLECTOMY     TOTAL KNEE ARTHROPLASTY  06/2010   left   TUBAL LIGATION       A IV Location/Drains/Wounds Patient Lines/Drains/Airways Status     Active Line/Drains/Airways     Name Placement date Placement time Site Days   Peripheral IV 02/03/23 20 G Right Antecubital 02/03/23  0256  Antecubital  less than 1            Intake/Output Last 24 hours  Intake/Output Summary (Last 24 hours) at 02/03/2023 0940 Last data filed at 02/03/2023 0517 Gross per 24 hour  Intake 120 ml  Output --  Net 120 ml    Labs/Imaging Results for orders placed or performed during the hospital encounter of 02/03/23 (from the past 48 hour(s))  CBC with Differential     Status: Abnormal   Collection Time: 02/03/23  2:55 AM  Result Value Ref Range    WBC 12.3 (H) 4.0 - 10.5 K/uL   RBC 5.80 (H) 3.87 - 5.11 MIL/uL   Hemoglobin 15.4 (H) 12.0 - 15.0 g/dL   HCT 03.4 (H) 74.2 - 59.5 %   MCV 85.0 80.0 - 100.0 fL   MCH 26.6 26.0 - 34.0 pg   MCHC 31.2 30.0 - 36.0 g/dL   RDW 63.8 75.6 - 43.3 %   Platelets 508 (H) 150 - 400 K/uL   nRBC 0.0 0.0 - 0.2 %   Neutrophils Relative % 83 %   Neutro Abs 10.4 (H) 1.7 - 7.7 K/uL   Lymphocytes Relative 5 %   Lymphs Abs 0.7 0.7 - 4.0 K/uL   Monocytes Relative 9 %   Monocytes Absolute 1.0 0.1 - 1.0 K/uL   Eosinophils Relative 1 %   Eosinophils Absolute 0.1 0.0 - 0.5 K/uL   Basophils Relative 1 %   Basophils Absolute 0.1 0.0 - 0.1 K/uL   Immature Granulocytes 1 %   Abs Immature Granulocytes 0.07 0.00 - 0.07 K/uL    Comment: Performed at [facility], [address], [place], [place] [address]  Basic metabolic panel     Status: Abnormal   Collection Time: 02/03/23  2:55 AM  Result Value Ref Range   Sodium 132 (L) 135 - 145 mmol/L   Potassium 3.4 (L) 3.5 - 5.1 mmol/L   Chloride 91 (L) 98 - 111 mmol/L   CO2 29 22 - 32 mmol/L   Glucose, Bld 120 (H) 70 - 99 mg/dL    Comment: Glucose reference range applies only to samples taken after fasting for at least 8 hours.   BUN <5 (L) 8 - 23 mg/dL   Creatinine, Ser 8.41 0.44 - 1.00 mg/dL   Calcium 9.1 8.9 - 66.0 mg/dL   GFR, Estimated >63 >01 mL/min    Comment: (NOTE) Calculated using the CKD-EPI Creatinine Equation (2021)    Anion gap 12 5 - 15    Comment: Performed at [facility], [address] [place], [place] [address]  Protime-INR     Status: None   Collection Time: 02/03/23  2:55 AM  Result Value Ref Range   Prothrombin Time 14.6 11.4 - 15.2 seconds   INR 1.1 0.8 - 1.2    Comment: (NOTE) INR goal varies based on device and disease states. Performed at [facility], [address], [place], [place] [address]   Magnesium  Status: None   Collection Time: 02/03/23  2:55 AM  Result Value Ref Range   Magnesium 2.0 1.7 -  2.4 mg/dL    Comment: Performed at [facility], [address], [place], [place] [address]   *Note: Due to a large number of results and/or encounters for the requested time period, some results have not been displayed. A complete set of results can be found in Results Review.   DG Lumbar Spine Complete  Result Date: 02/03/2023 CLINICAL DATA:  89 year old female with history of low back pain after a fall. EXAM: LUMBAR SPINE - COMPLETE 4+ VIEW COMPARISON:  No priors. FINDINGS: Multiple views of the lumbar spine demonstrate no definite acute displaced fracture or acute compression type fracture. Multiple chronic appearing compression fractures are noted at T11, L1, L2, L3 and L4, most severe at T11 where there is up to 50% loss of anterior vertebral body height. Post vertebroplasty changes are noted at T11, L1 and L2. Mild levoscoliosis convex to the left at the level of L1. Severe multilevel degenerative disc disease and facet arthropathy is noted throughout the lumbar spine, with most severe facet arthropathy at L4-L5 and L5-S1. IMPRESSION: 1. While there are multiple chronic appearing compression fractures and post vertebroplasty changes, in addition to extensive multilevel degenerative disc disease and lumbar spondylosis, as above, no definite acute findings are noted. Electronically Signed   By: Trudie Reed M.D.   On: 02/03/2023 05:32   CT HEAD WO CONTRAST ( )  Result Date: 02/03/2023 CLINICAL DATA:  Fall, head and neck trauma, on blood thinners EXAM: CT HEAD WITHOUT CONTRAST CT CERVICAL SPINE WITHOUT CONTRAST TECHNIQUE: Multidetector CT imaging of the head and cervical spine was performed following the standard protocol without intravenous contrast. Multiplanar CT image reconstructions of the cervical spine were also generated. RADIATION DOSE REDUCTION: This exam was performed according to the departmental dose-optimization program which includes automated exposure control, adjustment of  the mA and/or kV according to patient size and/or use of iterative reconstruction technique. COMPARISON:  01/23/2023 and 10/30/2022 CT of the thoracic spine FINDINGS: CT HEAD FINDINGS Brain: No evidence of acute infarct, hemorrhage, mass, mass effect, or midline shift. No hydrocephalus or extra-axial fluid collection. Periventricular white matter changes, likely the sequela of chronic small vessel ischemic disease. Remote lacunar infarct in the right thalamus. Vascular: No hyperdense vessel. Atherosclerotic calcifications in the intracranial carotid and vertebral arteries. Skull: Negative for fracture or focal lesion. Sinuses/Orbits: No acute finding. Other: Fluid in left mastoid air cells. CT CERVICAL SPINE FINDINGS Alignment: No traumatic listhesis. Skull base and vertebrae: Osteopenia. Compression fracture of T2 is new compared to 01/23/2023, likely acute. Approximately 40% vertebral body height loss, with 3 mm retropulsion of the posterosuperior cortex. Unchanged compression deformities of C7 and T3. Status post T4 kyphoplasty. Soft tissues and spinal canal: There is likely trace prevertebral edema anterior to T2. No visible canal hematoma. Disc levels: Degenerative changes in the cervical spine. No significant spinal canal stenosis. Upper chest: Negative. IMPRESSION: 1. No acute intracranial process. 2. Acute compression fracture of T2 with approximately 40% vertebral body height loss and 3 mm retropulsion of the posterosuperior cortex. These findings were discussed by telephone on 02/03/2023 at 4:09 am with provider KELLY HUMES . Electronically Signed   By: Wiliam Ke M.D.   On: 02/03/2023 04:10   CT Cervical Spine Wo Contrast  Result Date: 02/03/2023 CLINICAL DATA:  Fall, head and neck trauma, on blood thinners EXAM: CT HEAD WITHOUT CONTRAST CT CERVICAL SPINE  WITHOUT CONTRAST TECHNIQUE: Multidetector CT imaging of the head and cervical spine was performed following the standard protocol without  intravenous contrast. Multiplanar CT image reconstructions of the cervical spine were also generated. RADIATION DOSE REDUCTION: This exam was performed according to the departmental dose-optimization program which includes automated exposure control, adjustment of the mA and/or kV according to patient size and/or use of iterative reconstruction technique. COMPARISON:  01/23/2023 and 10/30/2022 CT of the thoracic spine FINDINGS: CT HEAD FINDINGS Brain: No evidence of acute infarct, hemorrhage, mass, mass effect, or midline shift. No hydrocephalus or extra-axial fluid collection. Periventricular white matter changes, likely the sequela of chronic small vessel ischemic disease. Remote lacunar infarct in the right thalamus. Vascular: No hyperdense vessel. Atherosclerotic calcifications in the intracranial carotid and vertebral arteries. Skull: Negative for fracture or focal lesion. Sinuses/Orbits: No acute finding. Other: Fluid in left mastoid air cells. CT CERVICAL SPINE FINDINGS Alignment: No traumatic listhesis. Skull base and vertebrae: Osteopenia. Compression fracture of T2 is new compared to 01/23/2023, likely acute. Approximately 40% vertebral body height loss, with 3 mm retropulsion of the posterosuperior cortex. Unchanged compression deformities of C7 and T3. Status post T4 kyphoplasty. Soft tissues and spinal canal: There is likely trace prevertebral edema anterior to T2. No visible canal hematoma. Disc levels: Degenerative changes in the cervical spine. No significant spinal canal stenosis. Upper chest: Negative. IMPRESSION: 1. No acute intracranial process. 2. Acute compression fracture of T2 with approximately 40% vertebral body height loss and 3 mm retropulsion of the posterosuperior cortex. These findings were discussed by telephone on 02/03/2023 at 4:09 am with provider KELLY HUMES . Electronically Signed   By: Wiliam Ke M.D.   On: 02/03/2023 04:10   DG Pelvis Portable  Result Date:  02/03/2023 CLINICAL DATA:  Status post fall. EXAM: PORTABLE PELVIS 1-2 VIEWS COMPARISON:  January 23, 2023 FINDINGS: There is no evidence of an acute pelvic fracture or dislocation. Bilateral proximal femoral intramedullary rods and compression screw device is are seen. Chronic bilateral inferior pubic rami fractures are noted. Marked severity degenerative changes are seen within the visualized portion of the lower lumbar spine. IMPRESSION: 1. No acute pelvic fracture or dislocation. 2. Prior ORIF of bilateral proximal femoral fractures, with chronic fractures of the bilateral inferior pubic rami. Electronically Signed   By: Aram Candela M.D.   On: 02/03/2023 03:43   DG Chest Port 1 View  Result Date: 02/03/2023 CLINICAL DATA:  Status post fall. EXAM: PORTABLE CHEST 1 VIEW COMPARISON:  January 23, 2023 FINDINGS: The heart size and mediastinal contours are within normal limits. Low lung volumes are noted with diffuse, chronic appearing increased interstitial lung markings. Mild atelectasis and/or infiltrate is seen within the left lung base. A small left pleural effusion is seen. No pneumothorax is identified. There are multiple chronic bilateral rib fractures. A chronic fracture deformity is seen involving the head and neck of the proximal right humerus. Multilevel vertebroplasty is noted throughout the thoracic spine. IMPRESSION: 1. Chronic changes with mild left basilar atelectasis and/or infiltrate. 2. Small left pleural effusion. Electronically Signed   By: Aram Candela M.D.   On: 02/03/2023 03:38    Pending Labs Unresulted Labs (From admission, onward)     Start     Ordered   02/04/23 0500  Basic metabolic panel  Tomorrow morning,   R        02/03/23 0854   02/04/23 0500  CBC  Tomorrow morning,   R        02/03/23  3875   02/03/23 0824  Urinalysis, Routine w reflex microscopic -Urine, Clean Catch  Once,   R       Question:  Specimen Source  Answer:  Urine, Clean Catch   02/03/23  0823            Vitals/Pain Today's Vitals   02/03/23 0715 02/03/23 0739 02/03/23 0759 02/03/23 0800  BP: (!) 157/97   (!) 145/88  Pulse: 85   84  Resp: 16   19  Temp:  97.8 F (36.6 C)    TempSrc:  Oral    SpO2: 97%   97%  PainSc:   7      Isolation Precautions No active isolations  Medications Medications  0.9 %  sodium chloride infusion ( Intravenous New Bag/Given 02/03/23 0917)  ondansetron (ZOFRAN) tablet 4 mg (has no administration in time range)    Or  ondansetron (ZOFRAN) injection 4 mg (has no administration in time range)  oxyCODONE (Oxy IR/ROXICODONE) immediate release tablet 5 mg (has no administration in time range)  acetaminophen (TYLENOL) tablet 1,000 mg (1,000 mg Oral Not Given 02/03/23 0924)  fentaNYL (SUBLIMAZE) injection 50 mcg (50 mcg Intravenous Given 02/03/23 0340)  oxyCODONE (Oxy IR/ROXICODONE) immediate release tablet 5 mg (5 mg Oral Given 02/03/23 0513)  HYDROmorphone (DILAUDID) injection 0.5 mg (0.5 mg Intravenous Given 02/03/23 0739)  potassium chloride SA (KLOR-CON M) CR tablet 20 mEq (20 mEq Oral Given 02/03/23 0915)    Mobility non-ambulatory     Focused Assessments Fall & pain control   R Recommendations: See Admitting Provider Note  Report given to:   Additional Notes: cannot walk d/t pain

## 2023-02-03 NOTE — Evaluation (Signed)
Physical Therapy Evaluation Patient Details Name: [name] MRN: [id number] DOB: 29-Jan-1930 Today's Date: 02/03/2023  History of Present Illness  Pt is 89 yo presenting to [facility] ED from [facility] rehab due to unwitnessed fall. Pt did hit her head and was wearing a c-collar found to have acute T2 fracture. PMHx:  osteoporosis,T9,10,11, L1 and L2 compression fxs w/ Kyphoplasty, L hip fracture, OA, CVA, CAD, CA, HTN  Clinical Impression  Pt is presenting slightly below base line level of functioning. Pt was w/c level mobility at last hospitalization. Pt appears to still be at W/C level mobilization. Pt total assist for bed mobility today and unable to progress to out of bed activities. Due to pt current functional status recommend return to skilled physical therapy services < 3 hours/day on discharge from acute care hospital setting in order to work on strength, balance and functional mobility to decrease risk for falls, injury, immobility and re-hospitalization.         If plan is discharge home, recommend the following: Two people to help with walking and/or transfers;Assistance with cooking/housework;Assist for transportation   Can travel by private vehicle   No    Equipment Recommendations Other (comment) (defer to post acute)     Functional Status Assessment Patient has had a recent decline in their functional status and demonstrates the ability to make significant improvements in function in a reasonable and predictable amount of time.     Precautions / Restrictions Precautions Precautions: Fall Restrictions Weight Bearing Restrictions: No      Mobility  Bed Mobility Overal bed mobility: Needs Assistance Bed Mobility: Supine to Sit, Sit to Supine     Supine to sit: Total assist Sit to supine: Total assist   General bed mobility comments: Pt is a total assist. Initially assisted minimally with LE progression toward EOB then unable further with maximum multi modal cues.  Pt then able to sit on EOB with Max A for trunk to mid line. Total A for sitting to supine.    Transfers   General transfer comment: Unable due to pt current cognitive status and pain        Balance Overall balance assessment: Needs assistance Sitting-balance support: Bilateral upper extremity supported, Feet unsupported Sitting balance-Leahy Scale: Fair Sitting balance - Comments: No overt LOB, SBA for safety due to sway       Pertinent Vitals/Pain Pain Assessment Pain Assessment: Faces Faces Pain Scale: Hurts whole lot Pain Location: sternum Pain Descriptors / Indicators: Guarding, Grimacing Pain Intervention(s): Monitored during session, Limited activity within patient's tolerance    Home Living Family/patient expects to be discharged to:: Skilled nursing facility       Additional Comments: [facility] Previously from [employer] [facility]    Prior Function Prior Level of Function : Needs assist       Physical Assist : Mobility (physical) Mobility (physical): Transfers;Gait   Mobility Comments: Needs help with transfers and uses W/C at baseline ADLs Comments: Assist with ADL"s.     Extremity/Trunk Assessment   Upper Extremity Assessment Upper Extremity Assessment: Generalized weakness    Lower Extremity Assessment Lower Extremity Assessment: Generalized weakness    Cervical / Trunk Assessment Cervical / Trunk Assessment: Kyphotic  Communication   Communication Communication: Hearing impairment Cueing Techniques: Verbal cues;Tactile cues;Visual cues  Cognition Arousal: Lethargic Behavior During Therapy: Anxious Overall Cognitive Status: No family/caregiver present to determine baseline cognitive functioning      General Comments General comments (skin integrity, edema, etc.): Pt is reporting  pain in the sternum BP 134/76        Assessment/Plan    PT Assessment Patient needs continued PT services  PT Problem List Decreased strength;Decreased  mobility;Pain;Decreased activity tolerance       PT Treatment Interventions DME instruction;Therapeutic exercise;Wheelchair mobility training;Gait training;Balance training;Neuromuscular re-education;Functional mobility training;Therapeutic activities;Patient/family education    PT Goals (Current goals can be found in the Care Plan section)  Acute Rehab PT Goals Patient Stated Goal: To decrease pain PT Goal Formulation: With patient Time For Goal Achievement: 02/17/23 Potential to Achieve Goals: Fair    Frequency Min 1X/week        AM-PAC PT "6 Clicks" Mobility  Outcome Measure Help needed turning from your back to your side while in a flat bed without using bedrails?: Total Help needed moving from lying on your back to sitting on the side of a flat bed without using bedrails?: Total Help needed moving to and from a bed to a chair (including a wheelchair)?: Total Help needed standing up from a chair using your arms (e.g., wheelchair or bedside chair)?: Total Help needed to walk in hospital room?: Total Help needed climbing 3-5 steps with a railing? : Total 6 Click Score: 6    End of Session   Activity Tolerance: Patient limited by pain;Patient limited by lethargy;Other (comment) (mental status pt states it is too early to move) Patient left: in bed;with bed alarm set;with call bell/phone within reach Nurse Communication: Mobility status;Need for lift equipment PT Visit Diagnosis: Other abnormalities of gait and mobility (R26.89)    Time: 8295-6213 PT Time Calculation (min) (ACUTE ONLY): 17 min   Charges:   PT Evaluation $PT Eval Low Complexity: 1 Low   PT General Charges $$ ACUTE PT VISIT: 1 Visit       Harrel Carina, DPT, CLT  Acute Rehabilitation Services Office: [phone] (Secure chat preferred)\  Claudia Desanctis 02/03/2023, 2:41 PM

## 2023-02-03 NOTE — ED Triage Notes (Signed)
Pt BIB [facility] from [facility]. C/o fall on thinners, unwitnessed mechanical fall. Denies LOC, but did hit head. Wearing C-collar. Pain around sternum, L hip, and back.   Takes eliquis for afib.   EMS VS: 190/100, 95 % 2-3L 02 baseline

## 2023-02-03 NOTE — Assessment & Plan Note (Addendum)
Follows with oncology  Phlebotomy to maintain hematocrit below 45% Hct >45%, likely hemoconcentrated. Gentle IVF and repeat cbc in AM, if remains elevated consult hematology vs phlebotomy.  IV Iron as needed

## 2023-02-03 NOTE — Assessment & Plan Note (Addendum)
89 year old presenting to ED after fall at home found to have acute T2 compression fracture with approximately 40% vertebral height loss and 3mm retropulsion of the posterior superior cortex.  -observation to tele -neurosurgery consulted. Recommended follow up outpatient. No acute surgical intervention per ED provider -pain could not be controlled, refused to walk  -hx of multiple falls with recent admit for SDH with T3 and C7 compression fractures. T3 kyphoplasty that was attempted and aborted on 5/23 as patient was not able to tolerate c-collar removal, lay prone, maintain adequate saturation in addition to difficulty visualizing target for the procedure. Per NIR, patient is not a candidate for percutaneous kyphoplasty. Per daughter patient actually refused this procedure.  -pain control with scheduled tylenol 1000mg  TID x 3 days -oxycodone 5mg  q8 hour prn moderate to severe pain -lidocaine patch -discuss with IR if candidate for kyphoplasty if pain continues as daughter states she refused last time and thinks she would be willing to do it this time if candidate as had immediate relief in the past. Can not really gauge pain as she doesn't want to move right now.  -PT/OT.  -will return to SNF -on eliquis, continues to have falls.

## 2023-02-03 NOTE — Assessment & Plan Note (Addendum)
In NSR No longer on amiodarone or lopressor. Has not seen cardiology. Unsure why stopped.  Continue eliquis  High fall risk

## 2023-02-03 NOTE — Progress Notes (Signed)
   02/03/23 0240  Spiritual Encounters  Type of Visit Initial  Care provided to: Pt not available  Conversation partners present during encounter Nurse;Physician  Referral source Trauma page  Reason for visit Trauma  OnCall Visit Yes   Responded to trauma 2 page. Spoke with EMS, patient feel at [facility]. No family present.

## 2023-02-04 ENCOUNTER — Observation Stay (HOSPITAL_COMMUNITY): Payer: Medicare Other

## 2023-02-04 DIAGNOSIS — S22020D Wedge compression fracture of second thoracic vertebra, subsequent encounter for fracture with routine healing: Secondary | ICD-10-CM

## 2023-02-04 DIAGNOSIS — E876 Hypokalemia: Secondary | ICD-10-CM

## 2023-02-04 DIAGNOSIS — M5134 Other intervertebral disc degeneration, thoracic region: Secondary | ICD-10-CM | POA: Diagnosis not present

## 2023-02-04 DIAGNOSIS — K219 Gastro-esophageal reflux disease without esophagitis: Secondary | ICD-10-CM | POA: Diagnosis not present

## 2023-02-04 DIAGNOSIS — I5032 Chronic diastolic (congestive) heart failure: Secondary | ICD-10-CM | POA: Diagnosis not present

## 2023-02-04 DIAGNOSIS — I10 Essential (primary) hypertension: Secondary | ICD-10-CM | POA: Diagnosis not present

## 2023-02-04 DIAGNOSIS — J90 Pleural effusion, not elsewhere classified: Secondary | ICD-10-CM | POA: Diagnosis not present

## 2023-02-04 DIAGNOSIS — S12600A Unspecified displaced fracture of seventh cervical vertebra, initial encounter for closed fracture: Secondary | ICD-10-CM | POA: Diagnosis not present

## 2023-02-04 DIAGNOSIS — M47814 Spondylosis without myelopathy or radiculopathy, thoracic region: Secondary | ICD-10-CM | POA: Diagnosis not present

## 2023-02-04 LAB — BASIC METABOLIC PANEL
Anion gap: 10 (ref 5–15)
BUN: 8 mg/dL (ref 8–23)
CO2: 27 mmol/L (ref 22–32)
Calcium: 8.7 mg/dL — ABNORMAL LOW (ref 8.9–10.3)
Chloride: 96 mmol/L — ABNORMAL LOW (ref 98–111)
Creatinine, Ser: 0.82 mg/dL (ref 0.44–1.00)
GFR, Estimated: >60 mL/min (ref >60)
Glucose, Bld: 100 mg/dL — ABNORMAL HIGH (ref 70–99)
Potassium: 3.8 mmol/L (ref 3.5–5.1)
Sodium: 133 mmol/L — ABNORMAL LOW (ref 135–145)

## 2023-02-04 LAB — CBC
HCT: 42.3 % (ref 36.0–46.0)
Hemoglobin: 12.8 g/dL (ref 12.0–15.0)
MCH: 25.6 pg — ABNORMAL LOW (ref 26.0–34.0)
MCHC: 30.3 g/dL (ref 30.0–36.0)
MCV: 84.6 fL (ref 80.0–100.0)
Platelets: 464 10*3/uL — ABNORMAL HIGH (ref 150–400)
RBC: 5 MIL/uL (ref 3.87–5.11)
RDW: 15.3 % (ref 11.5–15.5)
WBC: 7.7 10*3/uL (ref 4.0–10.5)
nRBC: 0 % (ref 0.0–0.2)

## 2023-02-04 MED ORDER — LORAZEPAM 1 MG PO TABS
1.0000 mg | ORAL_TABLET | Freq: Once | ORAL | Status: AC
  Administered 2023-02-04: 1 mg via ORAL
  Filled 2023-02-04: qty 1

## 2023-02-04 MED ORDER — MORPHINE SULFATE (PF) 2 MG/ML IV SOLN
1.0000 mg | INTRAVENOUS | Status: DC | PRN
  Administered 2023-02-04 – 2023-02-13 (×16): 2 mg via INTRAVENOUS
  Filled 2023-02-04 (×17): qty 1

## 2023-02-04 MED ORDER — OXYCODONE HCL 5 MG PO TABS
5.0000 mg | ORAL_TABLET | ORAL | Status: DC | PRN
  Administered 2023-02-04 – 2023-02-12 (×21): 5 mg via ORAL
  Filled 2023-02-04 (×21): qty 1

## 2023-02-04 NOTE — TOC CAGE-AID Note (Signed)
Transition of Care [facility]) - CAGE-AID Screening   Patient Details  Name: [name] MRN: [id number] Date of Birth: May 03, 1930  [facility]) CM/SW Contact:    Katha Hamming, RN Phone Number: 02/04/2023, 6:47 AM   CAGE-AID Screening:    Have You Ever Felt You Ought to Cut Down on Your Drinking or Drug Use?: No Have People Annoyed You By Critizing Your Drinking Or Drug Use?: No Have You Felt Bad Or Guilty About Your Drinking Or Drug Use?: No Have You Ever Had a Drink or Used Drugs First Thing In The Morning to Steady Your Nerves or to Get Rid of a Hangover?: No CAGE-AID Score: 0  Substance Abuse Education Offered: No

## 2023-02-04 NOTE — TOC Initial Note (Signed)
Transition of Care [facility]) - Initial/Assessment Note    Patient Details  Name: [name] MRN: [id number] Date of Birth: 04-22-1930  [facility] CM/SW Contact:    Lorri Frederick, LCSW Phone Number: 02/04/2023, 3:13 PM  Clinical Narrative:  Pt off floor, pt daughter [name] in room.  CSW spoke with [name], who confirmed pt is LTC at [facility].  Plan will be for return to [facility] at DC. Unclear DC date at this time.  CSW confirmed with [employer]. Pt family is holding the bed for return.                 Expected Discharge Plan: Long Term Nursing Home Barriers to Discharge: Continued Medical Work up   Patient Goals and CMS Choice     Choice offered to / list presented to : Adult Children (daughter [name])      Expected Discharge Plan and Services In-house Referral: Clinical Social Work   Post Acute Care Choice: Skilled Nursing Facility Living arrangements for the past 2 months: Skilled Nursing Facility ([facility]-long term care)                                      Prior Living Arrangements/Services Living arrangements for the past 2 months: Skilled Nursing Facility ([facility]-long term care) Lives with:: Facility Resident Patient language and need for interpreter reviewed:: No        Need for Family Participation in Patient Care: Yes (Comment) Care giver support system in place?: Yes (comment) Current home services: Other (comment) (na) Criminal Activity/Legal Involvement Pertinent to Current Situation/Hospitalization: No - Comment as needed  Activities of Daily Living Home Assistive Devices/Equipment: Dentures (specify type) (UPPER PARTIAL) ADL Screening (condition at time of admission) Patient's cognitive ability adequate to safely complete daily activities?: Yes Is the patient deaf or have difficulty hearing?: No Does the patient have difficulty seeing, even when wearing glasses/contacts?: No Does the patient have difficulty  concentrating, remembering, or making decisions?: No Patient able to express need for assistance with ADLs?: Yes Does the patient have difficulty dressing or bathing?: Yes Independently performs ADLs?: No Communication: Independent Dressing (OT): Needs assistance Grooming: Needs assistance Feeding: Independent Bathing: Needs assistance Is this a change from baseline?: Pre-admission baseline In/Out Bed: Needs assistance Walks in Home: Needs assistance Does the patient have difficulty walking or climbing stairs?: Yes Weakness of Legs: Both Weakness of Arms/Hands: None  Permission Sought/Granted                  Emotional Assessment              Admission diagnosis:  Fall, initial encounter [W19.XXXA] Compression fracture of T2 vertebra (HCC) [S22.020A] Compression fracture of T2 vertebra, initial encounter (HCC) [S22.020A] Patient Active Problem List   Diagnosis Date Noted   Compression fracture of T2 vertebra (HCC) 02/03/2023   Hypokalemia 02/03/2023   Leukocytosis 02/03/2023   Chronic respiratory failure with hypoxia (HCC) 02/03/2023   Compression fracture of C7 vertebra (HCC) 10/01/2022   Compression fracture of T3 vertebra (HCC) 10/01/2022   Subdural hematoma (HCC) 09/29/2022   Humerus fracture 07/24/2022   Presence of drug-eluting stent in right coronary artery    Dilutional hyponatremia 02/25/2022   ST elevation myocardial infarction involving right coronary artery (HCC)    Paroxysmal atrial fibrillation (HCC)    Closed right radial fracture 11/25/2021   Protein-calorie malnutrition, severe  10/09/2021   Compression fracture of T7 vertebra  10/07/2021   Rib pain on right side 01/24/2021   PE (pulmonary thromboembolism) (HCC) 12/25/2020   Lumbar compression fracture (HCC) 11/16/2020   GERD (gastroesophageal reflux disease)    DVT (deep venous thrombosis) (HCC)    Diverticulitis    Coronary artery disease involving native coronary artery of native heart with  unstable angina pectoris (HCC)    Acute midline thoracic back pain 03/04/2020   Closed left hip fracture (HCC) 12/24/2019   ICH (intracerebral hemorrhage) (HCC) 11/04/2019   Intractable back pain 09/13/2019   Compression fracture of T9 vertebra (HCC) 09/13/2019   Chronic diastolic CHF (congestive heart failure) (HCC) 09/13/2019   Overweight (BMI 25.0-29.9) 09/13/2019   Constipation 09/13/2019   Raynaud's phenomenon without gangrene 05/19/2018   Iron deficiency anemia due to chronic blood loss 12/30/2017   Insomnia 06/25/2017   Superficial thrombophlebitis 04/06/2014   Breast cancer (HCC) 11/05/2011   Polycythemia 11/05/2011   Stroke (HCC) 03/29/2011   OSA (obstructive sleep apnea) 03/29/2011   OA (osteoarthritis) of knee 03/29/2011   Hiatal hernia 03/29/2011   E. coli gastroenteritis 03/29/2011   Depression with anxiety 03/29/2011   TIA (transient ischemic attack) 01/16/2011   CARDIOMYOPATHY 08/03/2009   DJD -- pain mgmt 08/16/2008   GAIT DISTURBANCE 10/21/2007   ANXIETY DEPRESSION 10/08/2007   Hyperlipidemia with target LDL less than 70 06/25/2007   Takotsubo cardiomyopathy 06/25/2007   GERD without esophagitis 06/25/2007   Essential hypertension 09/30/2006   Migraine with aura 09/30/2006   Hyperplastic colon polyp 06/2001   PCP:  [facility]:  No Pharmacies Listed    Social Determinants of Health (SDOH) Social History: SDOH Screenings   Food Insecurity: No Food Insecurity (02/03/2023)  Housing: Low Risk  (02/03/2023)  Transportation Needs: No Transportation Needs (02/03/2023)  Utilities: Not At Risk (02/03/2023)  Depression (PHQ2-9): Low Risk  (12/19/2021)  Financial Resource Strain: Low Risk  (08/10/2021)  Social Connections: Unknown (10/03/2021)   [facility] from [facility]  Tobacco Use: High Risk (02/03/2023)   SDOH Interventions:     Readmission Risk Interventions     No data to display

## 2023-02-04 NOTE — NC FL2 (Signed)
[place] MEDICAID FL2 LEVEL OF CARE FORM     IDENTIFICATION  Patient Name: [name] Birthdate: Sep 15, 1929 Sex: female Admission Date (Current Location): 02/03/2023  [facility] and [place] Number:  Producer, television/film/video and Address:  [facility] [place], [address] [place], [place] [address]      Provider Number: [id number]  Attending Physician Name and Address:  Kathlen Mody, MD  Relative Name and Phone Number:  Hill,Corey Son   [phone]    Current Level of Care: Hospital Recommended Level of Care: Skilled Nursing Facility Prior Approval Number:    Date Approved/Denied:   PASRR Number: [id number]  Discharge Plan: SNF    Current Diagnoses: Patient Active Problem List   Diagnosis Date Noted   Compression fracture of T2 vertebra (HCC) 02/03/2023   Hypokalemia 02/03/2023   Leukocytosis 02/03/2023   Chronic respiratory failure with hypoxia (HCC) 02/03/2023   Compression fracture of C7 vertebra (HCC) 10/01/2022   Compression fracture of T3 vertebra (HCC) 10/01/2022   Subdural hematoma (HCC) 09/29/2022   Humerus fracture 07/24/2022   Presence of drug-eluting stent in right coronary artery    Dilutional hyponatremia 02/25/2022   ST elevation myocardial infarction involving right coronary artery (HCC)    Paroxysmal atrial fibrillation (HCC)    Closed right radial fracture 11/25/2021   Protein-calorie malnutrition, severe 10/09/2021   Compression fracture of T7 vertebra  10/07/2021   Rib pain on right side 01/24/2021   PE (pulmonary thromboembolism) (HCC) 12/25/2020   Lumbar compression fracture (HCC) 11/16/2020   GERD (gastroesophageal reflux disease)    DVT (deep venous thrombosis) (HCC)    Diverticulitis    Coronary artery disease involving native coronary artery of native heart with unstable angina pectoris (HCC)    Acute midline thoracic back pain 03/04/2020   Closed left hip fracture (HCC) 12/24/2019   ICH (intracerebral  hemorrhage) (HCC) 11/04/2019   Intractable back pain 09/13/2019   Compression fracture of T9 vertebra (HCC) 09/13/2019   Chronic diastolic CHF (congestive heart failure) (HCC) 09/13/2019   Overweight (BMI 25.0-29.9) 09/13/2019   Constipation 09/13/2019   Raynaud's phenomenon without gangrene 05/19/2018   Iron deficiency anemia due to chronic blood loss 12/30/2017   Insomnia 06/25/2017   Superficial thrombophlebitis 04/06/2014   Breast cancer (HCC) 11/05/2011   Polycythemia 11/05/2011   Stroke (HCC) 03/29/2011   OSA (obstructive sleep apnea) 03/29/2011   OA (osteoarthritis) of knee 03/29/2011   Hiatal hernia 03/29/2011   E. coli gastroenteritis 03/29/2011   Depression with anxiety 03/29/2011   TIA (transient ischemic attack) 01/16/2011   CARDIOMYOPATHY 08/03/2009   DJD -- pain mgmt 08/16/2008   GAIT DISTURBANCE 10/21/2007   ANXIETY DEPRESSION 10/08/2007   Hyperlipidemia with target LDL less than 70 06/25/2007   Takotsubo cardiomyopathy 06/25/2007   GERD without esophagitis 06/25/2007   Essential hypertension 09/30/2006   Migraine with aura 09/30/2006   Hyperplastic colon polyp 06/2001    Orientation RESPIRATION BLADDER Height & Weight     Self, Time, Situation, Place  O2 Continent Weight:   Height:     BEHAVIORAL SYMPTOMS/MOOD NEUROLOGICAL BOWEL NUTRITION STATUS      Continent Diet (see discharge summary)  AMBULATORY STATUS COMMUNICATION OF NEEDS Skin     Verbally Other (Comment) (ecchymosis, redness)                       Personal Care Assistance Level of Assistance  Bathing, Feeding, Dressing Bathing Assistance: Limited assistance Feeding assistance: Limited assistance Dressing  Assistance: Limited assistance     Functional Limitations Info  Sight, Hearing, Speech Sight Info: Adequate Hearing Info: Impaired Speech Info: Adequate    SPECIAL CARE FACTORS FREQUENCY  PT (By licensed PT), OT (By licensed OT)     PT Frequency: 5x week OT Frequency: 5x week             Contractures Contractures Info: Not present    Additional Factors Info  Code Status, Allergies Code Status Info: DNR-limited Allergies Info: Penicillins, Valsartan, Atorvastatin, Carvedilol, Ezetimibe, Fluvastatin Sodium, Magnesium Hydroxide, Meloxicam, Pneumovax (Pneumococcal Polysaccharide Vaccine), Quinapril Hcl, Simvastatin, Topamax (Topiramate), Vit D-vit E-safflower Oil           Current Medications (02/04/2023):  This is the current hospital active medication list Current Facility-Administered Medications  Medication Dose Route Frequency Provider Last Rate Last Admin   acetaminophen (TYLENOL) tablet 1,000 mg  1,000 mg Oral Q8H Orland Mustard, MD   1,000 mg at 02/04/23 1354   apixaban (ELIQUIS) tablet 2.5 mg  2.5 mg Oral BID Orland Mustard, MD   2.5 mg at 02/04/23 0849   clopidogrel (PLAVIX) tablet 75 mg  75 mg Oral Daily Orland Mustard, MD   75 mg at 02/04/23 0849   docusate sodium (COLACE) capsule 100 mg  100 mg Oral BID Orland Mustard, MD   100 mg at 02/04/23 0849   lidocaine (LIDODERM) 5 % 1 patch  1 patch Transdermal Q24H Orland Mustard, MD   1 patch at 02/04/23 1354   LORazepam (ATIVAN) tablet 0.5 mg  0.5 mg Oral BID Orland Mustard, MD   0.5 mg at 02/04/23 0850   morphine (PF) 2 MG/ML injection 1-2 mg  1-2 mg Intravenous Q4H PRN Kathlen Mody, MD       ondansetron (ZOFRAN) tablet 4 mg  4 mg Oral Q6H PRN Orland Mustard, MD       Or   ondansetron [facility]) injection 4 mg  4 mg Intravenous Q6H PRN Orland Mustard, MD       oxyCODONE (Oxy IR/ROXICODONE) immediate release tablet 5 mg  5 mg Oral Q4H PRN Kathlen Mody, MD   5 mg at 02/04/23 1354   pantoprazole (PROTONIX) EC tablet 40 mg  40 mg Oral BID Orland Mustard, MD   40 mg at 02/04/23 0850   rosuvastatin (CRESTOR) tablet 20 mg  20 mg Oral QHS Orland Mustard, MD   20 mg at 02/03/23 2223   sertraline (ZOLOFT) tablet 25 mg  25 mg Oral QHS Orland Mustard, MD   25 mg at 02/03/23 2223     Discharge  Medications: Please see discharge summary for a list of discharge medications.  Relevant Imaging Results:  Relevant Lab Results:   Additional Information SSN [id number]  Lorri Frederick, LCSW

## 2023-02-04 NOTE — Evaluation (Signed)
Occupational Therapy Evaluation Patient Details Name: [name] MRN: [id number] DOB: 10/18/1929 Today's Date: 02/04/2023   History of Present Illness Pt is 89 yo presenting to [facility] ED from [facility] rehab due to unwitnessed fall. Pt did hit her head and was wearing a c-collar found to have acute T2 fracture. PMHx:  osteoporosis,T9,10,11, L1 and L2 compression fxs w/ Kyphoplasty, L hip fracture, OA, CVA, CAD, CA, HTN   Clinical Impression   Limited OT eval secondary to pt's increased pain in her sternal region and back.  She declined to attempt sitting EOB and with attempted rolling in the bed, she stopped and would not go further.  BUE strength limited at approximately 3/5 with grooming tasks and self feeding from bed level.  Pt with decreased ability to tolerate changes in head of bed position as well.  Feel she will benefit from trail OT to help increase functional mobility and active participation in selfcare tasks.  Post acute recommend continued inpatient follow up therapy, <3 hours/day.          If plan is discharge home, recommend the following: Two people to help with bathing/dressing/bathroom;Two people to help with walking and/or transfers;Assist for transportation;Help with stairs or ramp for entrance;Assistance with cooking/housework;Direct supervision/assist for medications management    Functional Status Assessment  Patient has had a recent decline in their functional status and demonstrates the ability to make significant improvements in function in a reasonable and predictable amount of time.  Equipment Recommendations  Other (comment) (TBD next venue of care)       Precautions / Restrictions Precautions Precautions: Fall Restrictions Weight Bearing Restrictions: No      Mobility Bed Mobility Overal bed mobility: Needs Assistance             General bed mobility comments: Unable to attempt secondary to increased pain.    Transfers                           Balance                                           ADL either performed or assessed with clinical judgement   ADL Overall ADL's : Needs assistance/impaired Eating/Feeding: Minimal assistance;Bed level Eating/Feeding Details (indicate cue type and reason): Pt able to drink from glass but difficulty reaching for it at approximate table height when simulated Grooming: Wash/dry face;Wash/dry hands;Bed level Grooming Details (indicate cue type and reason): HOB elevated                               General ADL Comments: Limited eval secondary to pts refusal to attempt EOB or even rolling in the bed secondary to pain.  Discussed with nursing and pt had been given all meds she could have.  Attempted to pursuade pt to try and when reaching for the opposite rail on the left side of the bed to roll, she stopped and wouldn't attempt further.     Vision Baseline Vision/History: 1 Wears glasses Ability to See in Adequate Light: 0 Adequate Patient Visual Report: No change from baseline Vision Assessment?: No apparent visual deficits     Perception Perception: Not tested       Praxis Praxis: Not tested       Pertinent Vitals/Pain Pain  Assessment Pain Assessment: Faces Faces Pain Scale: Hurts worst Pain Location: chest, back Pain Descriptors / Indicators: Discomfort, Grimacing Pain Intervention(s): Limited activity within patient's tolerance, Monitored during session, Patient requesting pain meds-RN notified     Extremity/Trunk Assessment Upper Extremity Assessment Upper Extremity Assessment: Generalized weakness (Limited BUE AROM and strength in the shoulders secondary to sternal/chest pain when moving them.  AAROM shoulder flexion bilaterally 0-90 with end range pain.  Pt able to wash face and drink from cup when presented.)   Lower Extremity Assessment Lower Extremity Assessment: Generalized weakness       Communication  Communication Communication: Hearing impairment Cueing Techniques: Verbal cues;Gestural cues   Cognition Arousal: Alert Behavior During Therapy: Anxious Overall Cognitive Status: Within Functional Limits for tasks assessed                                 General Comments: Pt oriented to place, time, and situation when asked     General Comments       Exercises     Shoulder Instructions      Home Living Family/patient expects to be discharged to:: Skilled nursing facility                                 Additional Comments: [facility] Previously from [facility]      Prior Functioning/Environment Prior Level of Function : Needs assist       Physical Assist : Mobility (physical) Mobility (physical): Transfers;Gait   Mobility Comments: Needs help with transfers and uses W/C at baseline ADLs Comments: Assist with ADL"s.        OT Problem List: Decreased strength;Impaired balance (sitting and/or standing);Decreased range of motion;Decreased knowledge of use of DME or AE;Impaired UE functional use;Pain      OT Treatment/Interventions: Self-care/ADL training;Therapeutic activities;DME and/or AE instruction;Patient/family education;Balance training    OT Goals(Current goals can be found in the care plan section) Acute Rehab OT Goals Patient Stated Goal: Pt did not express goals this session. OT Goal Formulation: Patient unable to participate in goal setting Time For Goal Achievement: 02/18/23 Potential to Achieve Goals: Fair  OT Frequency: Min 1X/week       AM-PAC OT "6 Clicks" Daily Activity     Outcome Measure Help from another person eating meals?: A Little Help from another person taking care of personal grooming?: A Little Help from another person toileting, which includes using toliet, bedpan, or urinal?: Total Help from another person bathing (including washing, rinsing, drying)?: Total Help from another person to put on  and taking off regular upper body clothing?: Total Help from another person to put on and taking off regular lower body clothing?: Total 6 Click Score: 10   End of Session Nurse Communication: Mobility status;Patient requests pain meds  Activity Tolerance: Patient limited by pain Patient left: in bed;with call bell/phone within reach  OT Visit Diagnosis: Repeated falls (R29.6);Muscle weakness (generalized) (M62.81);Pain Pain - Right/Left:  (chest and back)                Time: 1108-1140 OT Time Calculation (min): 32 min Charges:  OT General Charges $OT Visit: 1 Visit OT Evaluation $OT Eval High Complexity: 1 High OT Treatments $Self Care/Home Management : 8-22 mins Perrin Maltese, OTR/L Acute Rehabilitation Services  Office [phone] 02/04/2023

## 2023-02-04 NOTE — Progress Notes (Signed)
[facility]                                                                               [name], is a 89 y.o. female, DOB - March 13, 1930, ZOX:[id number] Admit date - 02/03/2023    Outpatient Primary MD for the patient is Rehab, [facility] - 0  days    Brief summary    89 y.o. female with medical history significant of CAD, diastolic CHF, hx of CVA, GERD, polycythemia,  hx of breast cancer s/p left lumpectomy, PAF on eliquis, depression and anxiety, frequent falls, hx of DVT and PE, chronic respiratory failure who presented to ED after a fall. She states she was walking from kitchen to the front door and guessed she lost her balance and fell onto her left side.   Assessment & Plan    Assessment and Plan: * Compression fracture of T2 vertebra (HCC) CT showing acute T2 compression fracture with approximately 40% vertebral height loss and 3mm retropulsion of the posterior superior cortex.  -neurosurgery consulted. Recommended follow up outpatient. No acute surgical intervention per ED provider - patient has a h/o  of multiple falls with recent admit for SDH with T3 and C7 compression fractures. T3 kyphoplasty that was attempted and aborted on 5/23 as patient was not able to tolerate c-collar removal, lay prone, maintain adequate saturation in addition to difficulty visualizing target for the procedure.  -pain control with scheduled tylenol 1000mg  TID x 3 days -oxycodone 5mg  q8 hour prn moderate to severe pain -lidocaine patch - IR consulted to see if she is a candidate for kyphoplasty. MRI of the thoracic spine ordered for further evaluation.  - PT/OT Consult.   Hypokalemia Replaced.   Leukocytosis Resolved with IV fluids. UA is negative.  CXR does not show any infection.   Chronic respiratory failure with hypoxia (HCC) On 2-3L home oxygen PRN  Was discharged from hospital in 09/2022 on oxygen and was weaned. Uses PRN  Per daughter was just  treated for pneumonia in SNF recently based off xray, but has no cough, shortness of breath or fever.  Coronary artery disease involving native coronary artery of native heart with unstable angina pectoris (HCC) S/p STEMI in 02/2022 LHC 10/13 notable for 95% calcified ostial RCA lesion. Had staged atherectomy and shockwave lithotripsy with DES x1 placed.  She was initially placed on DAPT with aspirin/Brilinta but given her episode of atrial fibrillation she was placed on plavix -- Continue plavix, Crestor 20 mg daily (no longer on metoprolol, unsure when discontinued)  -has not seen cardiology since this time. Discussed with daughter and importance of making an appointment.  - currently denies any chest pain.   Chronic diastolic CHF (congestive heart failure) (HCC) Appears Euvolemic.  Echo 09/2022: Unchanged hypokinesis of the inferior/inferolateral/inferoseptal base-mid wall EF 50-55%. There is severe asymmetric left ventricular hypertrophy of the basal-septal segment. Grade 1 DD.  Hold ing lasix.   Polycythemia Follows with oncology  Phlebotomy to maintain hematocrit below 45% Hemoglobin is around 12.    Paroxysmal atrial fibrillation (HCC) In NSR Not on lopressor or amiodarone.  On Eliquis for anti coagulation.   Essential hypertension  Well controlled.   GERD (gastroesophageal reflux disease) Continue daily PPI   Depression with anxiety Continue with zoloft nightly and ativan BID   Mild hyponatremia:  Sodium of 133  Hyperlipidemia:  Resume crestor 20 mg daily.   Estimated body mass index is 18.34 kg/m as calculated from the following:   Height as of 01/23/23: 5\' 5"  (1.651 m).   Weight as of 01/23/23: 50 kg.  Code Status: DNR limited.  DVT Prophylaxis:  apixaban (ELIQUIS) tablet 2.5 mg Start: 02/03/23 2200 apixaban (ELIQUIS) tablet 2.5 mg   Level of Care: Level of care: Telemetry Medical Family Communication: none at bedside.   Disposition Plan:     Remains  inpatient appropriate:  pending pain control and therapy evaluations.   Procedures:  None.   Consultants:   IR   Antimicrobials:   Anti-infectives (From admission, onward)    None        Medications  Scheduled Meds:  acetaminophen  1,000 mg Oral Q8H   apixaban  2.5 mg Oral BID   clopidogrel  75 mg Oral Daily   docusate sodium  100 mg Oral BID   furosemide  20 mg Oral Daily   lidocaine  1 patch Transdermal Q24H   LORazepam  0.5 mg Oral BID   pantoprazole  40 mg Oral BID   rosuvastatin  20 mg Oral QHS   sertraline  25 mg Oral QHS   Continuous Infusions: PRN Meds:.ondansetron **OR** ondansetron (ZOFRAN) IV, oxyCODONE    Subjective:   Sugar Mutchler was seen and examined today.  Pain not well controlled.   Objective:   Vitals:   02/04/23 0554 02/04/23 0555 02/04/23 0556 02/04/23 0718  BP: (!) 146/87  (!) 153/89   Pulse: 81 83 81   Resp: 19 17 (!) 25 20  Temp:   97.8 F (36.6 C) 98 F (36.7 C)  TempSrc:   Oral Oral  SpO2: 95% 96% 96%     Intake/Output Summary (Last 24 hours) at 02/04/2023 0913 Last data filed at 02/03/2023 1500 Gross per 24 hour  Intake 110 ml  Output --  Net 110 ml   There were no vitals filed for this visit.   Exam General: Alert and oriented x 3, NAD Cardiovascular: S1 S2 auscultated, no murmurs, RRR Respiratory: Clear to auscultation bilaterally, no wheezing, rales or rhonchi Gastrointestinal: Soft, nontender, nondistended, + bowel sounds Ext: no pedal edema bilaterally Neuro: alert , answering questions.  Skin: No rashes Psych: Normal affect and demeanor, alert and oriented x3    Data Reviewed:  I have personally reviewed following labs and imaging studies   CBC Lab Results  Component Value Date   WBC 7.7 02/04/2023   RBC 5.00 02/04/2023   HGB 12.8 02/04/2023   HCT 42.3 02/04/2023   MCV 84.6 02/04/2023   MCH 25.6 (L) 02/04/2023   PLT 464 (H) 02/04/2023   MCHC 30.3 02/04/2023   RDW 15.3 02/04/2023   LYMPHSABS  0.7 02/03/2023   MONOABS 1.0 02/03/2023   EOSABS 0.1 02/03/2023   BASOSABS 0.1 02/03/2023     Last metabolic panel Lab Results  Component Value Date   NA 133 (L) 02/04/2023   K 3.8 02/04/2023   CL 96 (L) 02/04/2023   CO2 27 02/04/2023   BUN 8 02/04/2023   CREATININE 0.82 02/04/2023   GLUCOSE 100 (H) 02/04/2023   GFRNONAA >60 02/04/2023   GFRAA NOT CALCULATED 12/27/2019   CALCIUM 8.7 (L) 02/04/2023   PHOS 3.3 10/08/2022   PROT 6.4 (  L) 01/23/2023   ALBUMIN 3.5 01/23/2023   LABGLOB 2.7 10/04/2016   AGRATIO 1.6 10/04/2016   BILITOT 0.8 01/23/2023   ALKPHOS 61 01/23/2023   AST 32 01/23/2023   ALT 17 01/23/2023   ANIONGAP 10 02/04/2023    CBG (last 3)  No results for input(s): "GLUCAP" in the last 72 hours.    Coagulation Profile: Recent Labs  Lab 02/03/23 0255  INR 1.1     Radiology Studies: DG Lumbar Spine Complete  Result Date: 02/03/2023 CLINICAL DATA:  89 year old female with history of low back pain after a fall. EXAM: LUMBAR SPINE - COMPLETE 4+ VIEW COMPARISON:  No priors. FINDINGS: Multiple views of the lumbar spine demonstrate no definite acute displaced fracture or acute compression type fracture. Multiple chronic appearing compression fractures are noted at T11, L1, L2, L3 and L4, most severe at T11 where there is up to 50% loss of anterior vertebral body height. Post vertebroplasty changes are noted at T11, L1 and L2. Mild levoscoliosis convex to the left at the level of L1. Severe multilevel degenerative disc disease and facet arthropathy is noted throughout the lumbar spine, with most severe facet arthropathy at L4-L5 and L5-S1. IMPRESSION: 1. While there are multiple chronic appearing compression fractures and post vertebroplasty changes, in addition to extensive multilevel degenerative disc disease and lumbar spondylosis, as above, no definite acute findings are noted. Electronically Signed   By: Trudie Reed M.D.   On: 02/03/2023 05:32   CT HEAD WO  CONTRAST ( )  Result Date: 02/03/2023 CLINICAL DATA:  Fall, head and neck trauma, on blood thinners EXAM: CT HEAD WITHOUT CONTRAST CT CERVICAL SPINE WITHOUT CONTRAST TECHNIQUE: Multidetector CT imaging of the head and cervical spine was performed following the standard protocol without intravenous contrast. Multiplanar CT image reconstructions of the cervical spine were also generated. RADIATION DOSE REDUCTION: This exam was performed according to the departmental dose-optimization program which includes automated exposure control, adjustment of the mA and/or kV according to patient size and/or use of iterative reconstruction technique. COMPARISON:  01/23/2023 and 10/30/2022 CT of the thoracic spine FINDINGS: CT HEAD FINDINGS Brain: No evidence of acute infarct, hemorrhage, mass, mass effect, or midline shift. No hydrocephalus or extra-axial fluid collection. Periventricular white matter changes, likely the sequela of chronic small vessel ischemic disease. Remote lacunar infarct in the right thalamus. Vascular: No hyperdense vessel. Atherosclerotic calcifications in the intracranial carotid and vertebral arteries. Skull: Negative for fracture or focal lesion. Sinuses/Orbits: No acute finding. Other: Fluid in left mastoid air cells. CT CERVICAL SPINE FINDINGS Alignment: No traumatic listhesis. Skull base and vertebrae: Osteopenia. Compression fracture of T2 is new compared to 01/23/2023, likely acute. Approximately 40% vertebral body height loss, with 3 mm retropulsion of the posterosuperior cortex. Unchanged compression deformities of C7 and T3. Status post T4 kyphoplasty. Soft tissues and spinal canal: There is likely trace prevertebral edema anterior to T2. No visible canal hematoma. Disc levels: Degenerative changes in the cervical spine. No significant spinal canal stenosis. Upper chest: Negative. IMPRESSION: 1. No acute intracranial process. 2. Acute compression fracture of T2 with approximately 40%  vertebral body height loss and 3 mm retropulsion of the posterosuperior cortex. These findings were discussed by telephone on 02/03/2023 at 4:09 am with provider KELLY HUMES . Electronically Signed   By: Wiliam Ke M.D.   On: 02/03/2023 04:10   CT Cervical Spine Wo Contrast  Result Date: 02/03/2023 CLINICAL DATA:  Fall, head and neck trauma, on blood thinners EXAM: CT HEAD WITHOUT CONTRAST  CT CERVICAL SPINE WITHOUT CONTRAST TECHNIQUE: Multidetector CT imaging of the head and cervical spine was performed following the standard protocol without intravenous contrast. Multiplanar CT image reconstructions of the cervical spine were also generated. RADIATION DOSE REDUCTION: This exam was performed according to the departmental dose-optimization program which includes automated exposure control, adjustment of the mA and/or kV according to patient size and/or use of iterative reconstruction technique. COMPARISON:  01/23/2023 and 10/30/2022 CT of the thoracic spine FINDINGS: CT HEAD FINDINGS Brain: No evidence of acute infarct, hemorrhage, mass, mass effect, or midline shift. No hydrocephalus or extra-axial fluid collection. Periventricular white matter changes, likely the sequela of chronic small vessel ischemic disease. Remote lacunar infarct in the right thalamus. Vascular: No hyperdense vessel. Atherosclerotic calcifications in the intracranial carotid and vertebral arteries. Skull: Negative for fracture or focal lesion. Sinuses/Orbits: No acute finding. Other: Fluid in left mastoid air cells. CT CERVICAL SPINE FINDINGS Alignment: No traumatic listhesis. Skull base and vertebrae: Osteopenia. Compression fracture of T2 is new compared to 01/23/2023, likely acute. Approximately 40% vertebral body height loss, with 3 mm retropulsion of the posterosuperior cortex. Unchanged compression deformities of C7 and T3. Status post T4 kyphoplasty. Soft tissues and spinal canal: There is likely trace prevertebral edema anterior  to T2. No visible canal hematoma. Disc levels: Degenerative changes in the cervical spine. No significant spinal canal stenosis. Upper chest: Negative. IMPRESSION: 1. No acute intracranial process. 2. Acute compression fracture of T2 with approximately 40% vertebral body height loss and 3 mm retropulsion of the posterosuperior cortex. These findings were discussed by telephone on 02/03/2023 at 4:09 am with provider KELLY HUMES . Electronically Signed   By: Wiliam Ke M.D.   On: 02/03/2023 04:10   DG Pelvis Portable  Result Date: 02/03/2023 CLINICAL DATA:  Status post fall. EXAM: PORTABLE PELVIS 1-2 VIEWS COMPARISON:  January 23, 2023 FINDINGS: There is no evidence of an acute pelvic fracture or dislocation. Bilateral proximal femoral intramedullary rods and compression screw device is are seen. Chronic bilateral inferior pubic rami fractures are noted. Marked severity degenerative changes are seen within the visualized portion of the lower lumbar spine. IMPRESSION: 1. No acute pelvic fracture or dislocation. 2. Prior ORIF of bilateral proximal femoral fractures, with chronic fractures of the bilateral inferior pubic rami. Electronically Signed   By: Aram Candela M.D.   On: 02/03/2023 03:43   DG Chest Port 1 View  Result Date: 02/03/2023 CLINICAL DATA:  Status post fall. EXAM: PORTABLE CHEST 1 VIEW COMPARISON:  January 23, 2023 FINDINGS: The heart size and mediastinal contours are within normal limits. Low lung volumes are noted with diffuse, chronic appearing increased interstitial lung markings. Mild atelectasis and/or infiltrate is seen within the left lung base. A small left pleural effusion is seen. No pneumothorax is identified. There are multiple chronic bilateral rib fractures. A chronic fracture deformity is seen involving the head and neck of the proximal right humerus. Multilevel vertebroplasty is noted throughout the thoracic spine. IMPRESSION: 1. Chronic changes with mild left basilar  atelectasis and/or infiltrate. 2. Small left pleural effusion. Electronically Signed   By: Aram Candela M.D.   On: 02/03/2023 03:38       Kathlen Mody M.D. [facility] 02/04/2023, 9:13 AM  Available via Epic secure chat 7am-7pm After 7 pm, please refer to night coverage provider listed on amion.

## 2023-02-04 NOTE — Plan of Care (Signed)
  Problem: Education: Goal: Understanding of cardiac disease, CV risk reduction, and recovery process will improve Outcome: Progressing Goal: Individualized Educational Video(s) Outcome: Progressing   Problem: Activity: Goal: Ability to tolerate increased activity will improve Outcome: Progressing   Problem: Cardiac: Goal: Ability to achieve and maintain adequate cardiovascular perfusion will improve Outcome: Progressing   Problem: Activity: Goal: Ability to return to baseline activity level will improve Outcome: Progressing   Problem: Education: Goal: Verbalization of understanding the information provided (i.e., activity precautions, restrictions, etc) will improve Outcome: Progressing Goal: Individualized Educational Video(s) Outcome: Progressing   Problem: Clinical Measurements: Goal: Ability to maintain clinical measurements within normal limits will improve Outcome: Progressing Goal: Will remain free from infection Outcome: Progressing Goal: Diagnostic test results will improve Outcome: Progressing Goal: Respiratory complications will improve Outcome: Progressing Goal: Cardiovascular complication will be avoided Outcome: Progressing

## 2023-02-05 DIAGNOSIS — S22069A Unspecified fracture of T7-T8 vertebra, initial encounter for closed fracture: Secondary | ICD-10-CM

## 2023-02-05 DIAGNOSIS — I5032 Chronic diastolic (congestive) heart failure: Secondary | ICD-10-CM | POA: Diagnosis not present

## 2023-02-05 DIAGNOSIS — I5022 Chronic systolic (congestive) heart failure: Secondary | ICD-10-CM

## 2023-02-05 DIAGNOSIS — S22020D Wedge compression fracture of second thoracic vertebra, subsequent encounter for fracture with routine healing: Secondary | ICD-10-CM | POA: Diagnosis not present

## 2023-02-05 DIAGNOSIS — I2510 Atherosclerotic heart disease of native coronary artery without angina pectoris: Secondary | ICD-10-CM

## 2023-02-05 DIAGNOSIS — I480 Paroxysmal atrial fibrillation: Secondary | ICD-10-CM

## 2023-02-05 DIAGNOSIS — Z01810 Encounter for preprocedural cardiovascular examination: Secondary | ICD-10-CM | POA: Diagnosis not present

## 2023-02-05 DIAGNOSIS — I10 Essential (primary) hypertension: Secondary | ICD-10-CM | POA: Diagnosis not present

## 2023-02-05 DIAGNOSIS — E876 Hypokalemia: Secondary | ICD-10-CM | POA: Diagnosis not present

## 2023-02-05 DIAGNOSIS — K219 Gastro-esophageal reflux disease without esophagitis: Secondary | ICD-10-CM | POA: Diagnosis not present

## 2023-02-05 MED ORDER — HYDRALAZINE HCL 25 MG PO TABS
25.0000 mg | ORAL_TABLET | Freq: Three times a day (TID) | ORAL | Status: DC | PRN
  Administered 2023-02-05: 25 mg via ORAL
  Filled 2023-02-05: qty 1

## 2023-02-05 NOTE — Progress Notes (Signed)
PT Cancellation Note  Patient Details Name: [name] MRN: [id number] DOB: 1930/05/01   Cancelled Treatment:    Reason Eval/Treat Not Completed: Patient declined, no reason specified on first attempt, she was on the bedpan. On second attempt, she refused PT due to pain (had been recently medicated by nursing staff), offered bed level exercises/bed level session only but unable to convince her to participate. Will return next day of service.   Nedra Hai, PT, DPT 02/05/23 3:30 PM

## 2023-02-05 NOTE — Consult Note (Addendum)
Cardiology Consultation   Patient ID: [name] MRN: [id number]; DOB: 06-Mar-1930  Admit date: 02/03/2023 Date of Consult: 02/05/2023  PCP:  [name] [facility] Providers Cardiologist:  Dawn Herrlich, MD   {   Patient Profile:   [name] is a 89 y.o. female with a history of CAD with inferior STEMI in 02/2022 s/p atherectomy and intracoronary lithotripsy followed by DES to ostial/proximal RCA, paroxysmal atrial fibrillation on Eliquis, remote Takotsubo cardiomyopathy in the 1990s and chronic HFmrEF with normalization of EF to 50-55% on last Echo in 09/2022, CVA, subdural hematoma in 09/2022, hypertension, hyperlipidemia, GERD, iron deficiency anemia, and spinal compression fractions who is being seen 02/05/2023 for preop evaluation for kyphoplasty at the request of [name].    History of Present Illness:   [name] is a 89 year old female with the above history who is followed by [name] although she has not been seen by [name] since 02/2021. Patient has a history of Takotsubo cardiomyopathy in the 1990s with normal cardiac catheterization at that time.  Repeat cardiac catheterization in 2003 showed only mild nonobstructive CAD.  Has been mildly low in the past at 40% but had recovered to 60% on repeat Echo in 05/2018.  She was admitted in 02/2022 for an inferior STEMI.  LHC at that time showed severe single-vessel CAD with 95% stenosis of the ostial to proximal RCA with heavy callus vacation as well as 45% stenosis of mid to distal left main and otherwise only mild nonobstructive disease.  She underwent staged PCI with atherectomy and intracoronary lithotripsy followed by DES to the RCA lesion.  She was also noted to have new onset atrial fibrillation this admission was started on Plavix and Eliquis.  Echo showed EF of 45-50% with hypokinesis of the basal inferior lateral segment, basal anterior lateral segment, basal inferior segment,  and basal inferior septal segment as well as grade 2 diastolic dysfunction, moderate biatrial enlargement, mild AS.  She was admitted in 09/2018 for for an unwitnessed fall and was found to have a subdural hematoma as well as C7 and T3 compression fractures.  She was seen by Neurosurgery and conservative therapy was recommended.  A T3 kyphoplasty was attempted by IR IR but was aborted due to patient not being able to position herself for the procedure.  Plavix and Eliquis were initially held but were ultimately able to be restarted prior to discharge.  She was seen in the ED on 01/23/2023 for another unwitnessed fall where she sustained a head laceration.  Head CT with no acute findings ultimately felt to be stable for discharge from the ED without admission.  Patient presented back to the ED on 02/03/2023 after another unwitnessed fall where she again hit her head.  Head CT showed no acute findings but CT of cervical spine did show an acute compression fraction of T2 with approximately 40% vertebral body height loss and 3 mm retropulsion of the posterior superior cortex.  MRI of the thoracic spine showed subacute compression fractions involving C7, T2, T3, T5, and T7 vertebral bodies as well as chronic compression fractions involving T4, T8, T9, T10, T12, and L1 vertebral bodies as well a small layering bilateral pleural effusions (left > right).  She was admitted and started on pain control.  Radiology was consulted and has recommended a kyphoplasty.  Cardiology was consulted to assist with recommendations on holding Plavix.  Patient is alert and oriented x3 but is a  very difficult historian. She has a hard time given me details about her symptoms. She states she has had 4 falls since 11/2021. However, she is unable to tell me what exactly caused her to fall. Per chart review, falls have often been described as mechanical. However, she states she thinks is also dizzy prior to the falls. She denies any LOC. She  does describe some vague chest pain that she has difficulty describing; however, it sounds like musculoskeletal from her falls and injuries. She has chronic dyspnea on exertion and has PRN O2 at home. She states she often forgets to wear the O2. No orthopnea, PND, or edema. She notes some palpitations when she is short of breath. She denies any abnormal bleeding.  Past Medical History:  Diagnosis Date   CAD (coronary artery disease)    mild nonobstructive disease on cath in 2003   Cancer [facility])    Cardiomyopathy    Probable Takotsubo, severe CP w/ normal cath in 1994. Severe CP in 2003 w/ widespread T wave inversions on ECG. Cath w/ minimal coronary disease but LV-gram showed periapical severe hypokinesis and basilar hyperkinesia (EF 40%). Last echo in 4/09 confirmed full LV functional recovery with EF 60%, no regional wall motion abnormalities, mild to moderate LVH.   Chronic diastolic CHF (congestive heart failure) (HCC) 09/13/2019   CVA (cerebral infarction)    Depression with anxiety 03/29/2011   lost husband 3'09   DVT (deep venous thrombosis) (HCC)    after venous ablation, R leg   Fracture 09/10/2007   L2, status post vertebroplasty of L2 performed by IR   GERD (gastroesophageal reflux disease)    Hemorrhoids    Hiatal hernia 03/29/2011   no nerve problems   Hyperlipidemia    Hyperplastic colon polyp 06/2001   Hypertension    Iron deficiency anemia due to chronic blood loss 12/30/2017   OA (osteoarthritis) of knee 03/29/2011   w/ bilateral knee pain-not a problem now   OSA (obstructive sleep apnea) 03/29/2011   no cpap used, not a problem now.   Osteoporosis    Otalgia of both ears    Dr. Emeline Darling   Polycythemia    Stroke [facility]) 03/29/2011   CVA x2 -last 10'12-?TIA(visual problems)   Varicose vein     Past Surgical History:  Procedure Laterality Date   APPENDECTOMY     BACK SURGERY     BREAST BIOPSY     BREAST CYST EXCISION     BREAST LUMPECTOMY Left 2013   left stage I  left breast cancer   CHOLECYSTECTOMY  04/09/2011   Procedure: LAPAROSCOPIC CHOLECYSTECTOMY WITH INTRAOPERATIVE CHOLANGIOGRAM;  Surgeon: Adolph Pollack, MD;  Location: [facility] ORS;  Service: General;  Laterality: N/A;   CORONARY ATHERECTOMY N/A 02/26/2022   Procedure: CORONARY ATHERECTOMY;  Surgeon: Yvonne Kendall, MD;  Location: [facility] INVASIVE CV LAB;  Service: Cardiovascular;  Laterality: N/A;   CORONARY LITHOTRIPSY N/A 02/26/2022   Procedure: CORONARY LITHOTRIPSY;  Surgeon: Yvonne Kendall, MD;  Location: [facility] INVAS[facility] CV LAB;  Service: Cardiovascular;  Laterality: N/A;   CORONARY STENT INTERVENTION N/A 02/26/2022   Procedure: CORONARY STENT INTERVENTION;  Surgeon: Yvonne Kendall, MD;  Location: [facility];  Service: Cardiovascular;  Laterality: N/A;   CORONARY ULTRASOUND/IVUS N/A 02/26/2022   Procedure: Intravascular Ultrasound/IVUS;  Surgeon: Yvonne Kendall, MD;  Location: [facility];  Service: Cardiovascular;  Laterality: N/A;   Dental Extraction     L maxillary molar   FEMUR IM NAIL Right 07/25/2022  Procedure: INTRAMEDULLARY (IM) NAIL FEMORAL;  Surgeon: Yolonda Kida, MD;  Location: [facility] OR;  Service: Orthopedics;  Laterality: Right;  hana table,carm.synthesis   INTRAMEDULLARY (IM) NAIL INTERTROCHANTERIC Left 12/25/2019   Procedure: INTRAMEDULLARY (IM) NAIL INTERTROCHANTRIC;  Surgeon: Durene Romans, MD;  Location: [facility];  Service: Orthopedics;  Laterality: Left;   IR KYPHO THORACIC WITH BONE BIOPSY  09/15/2019   IR KYPHO THORACIC WITH BONE BIOPSY  07/13/2020       IR KYPHO THORACIC WITH BONE BIOPSY  07/13/2020   IR VERTEBROPLASTY CERV/THOR BX INC UNI/BIL INC/INJECT/IMAGING  11/10/2019   KYPHOPLASTY N/A 11/16/2020   Procedure: T11,  L1,KYPHOPLASTY;  Surgeon: Venita Lick, MD;  Location: [facility] OR;  Service: Orthopedics;  Laterality: N/A;   KYPHOSIS SURGERY  08/2007   cement used   LEFT HEART CATH AND CORONARY ANGIOGRAPHY N/A 02/23/2022   Procedure: LEFT HEART CATH AND  CORONARY ANGIOGRAPHY;  Surgeon: Swazil[place], Peter M, MD;  Location: [facility] INVASIVE CV LAB;  Service: Cardiovascular;  Laterality: N/A;   LEFT HEART CATH AND CORONARY ANGIOGRAPHY N/A 02/26/2022   Procedure: LEFT HEART CATH AND CORONARY ANGIOGRAPHY;  Surgeon: Yvonne Kendall, MD;  [facility]: [facility];  Service: Cardiovascular;  Laterality: N/A;   OVARIAN CYST SURGERY     left   RADIOLOGY WITH ANESTHESIA N/A 11/10/2019   Procedure: VERTEBROPLASTY;  Surgeon: Julieanne Cotton, MD;  Location: [facility];  Service: Radiology;  Laterality: N/A;   RADIOLOGY WITH ANESTHESIA N/A 10/04/2022   Procedure: T3 vertebroplasty;  Surgeon: Julieanne Cotton, MD;  Location: [facility] OR;  Service: Radiology;  Laterality: N/A;   TEMPORARY PACEMAKER N/A 02/26/2022   Procedure: TEMPORARY PACEMAKER;  Surgeon: Yvonne Kendall, MD;  Location: [facility];  Service: Cardiovascular;  Laterality: N/A;   TONSILLECTOMY     TOTAL KNEE ARTHROPLASTY  06/2010   left   TUBAL LIGATION         Inpatient Medications: Scheduled Meds:  acetaminophen  1,000 mg Oral Q8H   apixaban  2.5 mg Oral BID   clopidogrel  75 mg Oral Daily   docusate sodium  100 mg Oral BID   lidocaine  1 patch Transdermal Q24H   LORazepam  0.5 mg Oral BID   pantoprazole  40 mg Oral BID   rosuvastatin  20 mg Oral QHS   sertraline  25 mg Oral QHS   Continuous Infusions:  PRN Meds: hydrALAZINE, morphine injection, ondansetron **OR** ondansetron (ZOFRAN) IV, oxyCODONE  Allergies:    Allergies  Allergen Reactions   Penicillins Hives, Rash and Other (See Comments)    Has patient had a PCN reaction causing immediate rash, facial/tongue/throat swelling, SOB or lightheadedness with hypotension: Yes Has patient had a PCN reaction causing severe rash involving mucus membranes or skin necrosis: yes Has patient had a PCN reaction that required hospitalization: No Has patient had a PCN reaction occurring within the last 10 years: no If all of the above  answers are "NO", then may proceed with Cephalosporin use.  Other Reaction: OTHER REACTION   Valsartan Itching   Atorvastatin Diarrhea   Carvedilol Other (See Comments)    "teeth hurt when I took it"    Ezetimibe Nausea Only   Fluvastatin Sodium Other (See Comments)    "Can't remember"   Magnesium Hydroxide Other (See Comments)    REACTION: triggers HAs   Meloxicam Other (See Comments)    Pt seeing auro's / spots.     Pneumovax [Pneumococcal Polysaccharide Vaccine] Other (See Comments)    Local reaction  Quinapril Hcl Other (See Comments)     "feelings of tiredness"    Simvastatin Diarrhea   Topamax [Topiramate] Itching   Vit D-Vit E-Safflower Oil Other (See Comments)    Headaches     Social History:   Social History   Socioeconomic History   Marital status: Widowed    Spouse name: Not on file   Number of children: 3   Years of education: Not on file   Highest education level: Not on file  Occupational History   Occupation: n/a  Tobacco Use   Smoking status: Some Days    Current packs/day: 1.00    Average packs/day: 1 pack/day for 71.6 years (71.6 ttl pk-yrs)    Types: Cigarettes    Start date: 06/26/1951   Smokeless tobacco: Never   Tobacco comments:    quit 25 years ago  Vaping Use   Vaping status: Never Used  Substance and Sexual Activity   Alcohol use: Not Currently    Alcohol/week: 1.0 standard drink of alcohol    Types: 1 Glasses of wine per week   Drug use: No   Sexual activity: Not Currently  Other Topics Concern   Not on file  Social History Narrative   Lost husband 07/29/07-    Lives in [place]   has 3 daughters, lost Vernona Rieger (03-2017)   Moved to ALF 04-2021   [name] lives in [place], [place] ( [place])     Not driving as of [phone]            Social Determinants of Health   Financial Resource Strain: Low Risk  (08/10/2021)   Overall Financial Resource Strain (CARDIA)    Difficulty of Paying Living Expenses: Not hard at all  Food Insecurity:  No Food Insecurity (02/03/2023)   Hunger Vital Sign    Worried About Running Out of Food in the Last Year: Never true    Ran Out of Food in the Last Year: Never true  Transportation Needs: No Transportation Needs (02/03/2023)   PRAPARE - Administrator, Civil Service (Medical): No    Lack of Transportation (Non-Medical): No  Physical Activity: Not on file  Stress: Not on file  Social Connections: Unknown (10/03/2021)   [facility]   Social Network    Social Network: Not on file  Intimate Partner Violence: Not At Risk (02/03/2023)   Humiliation, Afraid, Rape, and Kick questionnaire    Fear of Current or Ex-Partner: No    Emotionally Abused: No    Physically Abused: No    Sexually Abused: No    Family History:   Family History  Problem Relation Age of Onset   Breast cancer Mother    Heart disease Father        MIs   Colon cancer Other        cousin   Breast cancer Sister    Leukemia Brother        GM     ROS:  Please see the history of present illness.  Difficult to get full ROS as patient is a poor historian.  Physical Exam/Data:   Vitals:   02/04/23 2006 02/05/23 0418 02/05/23 0500 02/05/23 0805  BP: (!) 159/74 (!) 145/84  (!) 177/96  Pulse: 93 78  76  Resp:      Temp: 98.2 F (36.8 C) 97.8 F (36.6 C)  97.6 F (36.4 C)  TempSrc: Oral Oral  Oral  SpO2: 94% 94%    Weight:  47.3 kg     Intake/Output Summary (Last 24 hours) at 02/05/2023 1635 Last data filed at 02/05/2023 1031 Gross per 24 hour  Intake --  Output 1100 ml  Net -1100 ml      02/05/2023    5:00 AM 01/23/2023    9:23 PM 01/03/2023    3:39 PM  Last 3 Weights  Weight (lbs) 104 lb 4.4 oz 110 lb 3.7 oz 110 lb 0.2 oz  Weight (kg) 47.3 kg 50 kg 49.9 kg     Body mass index is 17.35 kg/m.  General: Thin frail Caucasian 89 y.o. female resting comfortably in no acute distress. HEENT: Normocephalic and atraumatic.  Neck: Supple. No JVD. Heart: RRR. II/VI  systolic murmur.   Lungs: No increased work of breathing. Clear to auscultation anteriorly (was unable to auscultate posteriorly given significant pain with movement). Extremities: No lower extremity edema.    Skin: Warm and dry. Neuro: Alert and oriented x3. No focal deficits. Psych: Normal affect. Responds appropriately.   EKG:  The EKG was personally reviewed and demonstrates:  Narrow complex tachycardia, rate 105 beats minute, with irregular rhythm.  There is a lot of underlying artifact but some P waves can be seen; therefore, this is likely sinus tachycardia with PACs. Telemetry:  Not on telemetry.  Relevant CV Studies:  Left Cardiac Catheterization 02/23/2022:   Mid LM to Dist LM lesion is 45% stenosed.   Mid LAD lesion is 30% stenosed.   Dist LAD lesion is 30% stenosed.   Ost Cx to Prox Cx lesion is 30% stenosed.   Ramus lesion is 20% stenosed.   Ost RCA to Prox RCA lesion is 95% stenosed.   The left ventricular systolic function is normal.   LV end diastolic pressure is normal.   The left ventricular ejection fraction is greater than 65% by visual estimate.   Single vessel obstructive CAD with severe ostial RCA stenosis with heavy calcification. There is TIMI 3 flow now and patient is pain free Vigorous LV function. Low LVEDP   Plan: discussed with Dr Excell Seltzer. Patient has complex ostial RCA stenosis. It is heavily calcified. Will require atherectomy to treat adequately. I would recommend aggressive medical therapy to stabilize. Loaded with Brilinta. Will resume IV heparin once TR band is off. Start beta blocker, statin, IV amiodarone for Afib. IV hydration.  Plan for staged PCI of the LCx on Monday with atherectomy.  _______________  Coronary Stent Intervention 02/26/2022: Conclusions: Severe ostial/proximal right coronary artery disease, as detailed in Dr. Elvis Coil diagnostic catheterization report from 02/22/2022. Successful PCI to ostial/proximal RCA utilizing orbital  atherectomy and intracoronary lithotripsy with placement of a Synergy 4.0 x 16 mm drug-eluting stent with 10% residual stenosis and TIMI-3 flow. Successful placement of 59F temporary transvenous pacing wire via the right femoral vein; the pacing wire was removed at the end of the procedure.   Recommendations: Aggressive secondary prevention of coronary artery disease. Restart IV heparin 2 hours after TR band has been removed and femoral vein hemostasis achieved. Continue ticagrelor and aspirin for now; anticipate transitioning to clopidogrel and apixaban as soon as tomorrow if there is no sign of bleeding/vascular injury. _______________  Echocardiogram 09/30/2022: Impressions:  1. No sig LVOT obstruction. Basal/septal hypertrophy can be seen with old  age. Unchanged hypokinesis of the inferior/inferolateral/inferoseptal  base-mid wall. Left ventricular ejection fraction, by estimation, is 50 to  55%. The left ventricle has low  normal function. There is severe asymmetric left ventricular hypertrophy  of the basal-septal segment.  Left ventricular diastolic parameters are  consistent with Grade I diastolic dysfunction (impaired relaxation).   2. No evidence of mitral valve regurgitation.   3. There is moderate calcification of the aortic valve. Aortic valve  regurgitation is not visualized. Mild aortic valve stenosis.   4. The inferior vena cava is normal in size with greater than 50%  respiratory variability, suggesting right atrial pressure of 3 mmHg.     Laboratory Data:  High Sensitivity Troponin:  No results for input(s): "TROPONINIHS" in the last 720 hours.   Chemistry Recent Labs  Lab 02/03/23 0255 02/04/23 0350  NA 132* 133*  K 3.4* 3.8  CL 91* 96*  CO2 29 27  GLUCOSE 120* 100*  BUN <5* 8  CREATININE 0.64 0.82  CALCIUM 9.1 8.7*  MG 2.0  --   GFRNONAA >60 >60  ANIONGAP 12 10    No results for input(s): "PROT", "ALBUMIN", "AST", "ALT", "ALKPHOS", "BILITOT" in the last  168 hours. Lipids No results for input(s): "CHOL", "TRIG", "HDL", "LABVLDL", "LDLCALC", "CHOLHDL" in the last 168 hours.  Hematology Recent Labs  Lab 02/03/23 0255 02/04/23 0350  WBC 12.3* 7.7  RBC 5.80* 5.00  HGB 15.4* 12.8  HCT 49.3* 42.3  MCV 85.0 84.6  MCH 26.6 25.6*  MCHC 31.2 30.3  RDW 15.4 15.3  PLT 508* 464*   Thyroid No results for input(s): "TSH", "FREET4" in the last 168 hours.  BNPNo results for input(s): "BNP", "PROBNP" in the last 168 hours.  DDimer No results for input(s): "DDIMER" in the last 168 hours.   Radiology/Studies:  MR THORACIC SPINE WO CONTRAST  Result Date: 02/05/2023 CLINICAL DATA:  Initial evaluation for compression fracture. EXAM: MRI THORACIC SPINE WITHOUT CONTRAST TECHNIQUE: Multiplanar, multisequence MR imaging of the thoracic spine was performed. No intravenous contrast was administered. COMPARISON:  Prior CT from 01/23/2023. FINDINGS: Alignment:  Examination degraded by motion artifact. Exaggeration of the normal thoracic kyphosis. No significant listhesis. Vertebrae: Chronic compression fractures involving the T4, T7, T8, T9, T10, T12, and L1 vertebral bodies with sequelae of prior vertebral augmentation. Mild bony retropulsion about several of these fractures without significant stenosis. Compression fracture involving the T2 vertebral body with mild 30% height loss and trace 2 mm retropulsion. Persistent mild marrow edema, consistent with a probable subacute compression fracture. No significant stenosis. Compression fracture of the T3 vertebral body with advanced 80% height loss and 3 mm bony retropulsion. Persistent marrow edema, consistent with a subacute compression fracture. No significant stenosis. Fracture deformity of the T5 vertebral body without associated height loss or retropulsion, likely subacute in nature. Fracture deformity of the T7 vertebral body with advanced 70-80% height loss and trace 2 mm bony retropulsion. Persistent mild marrow  edema, consistent with a probable subacute compression fracture. No significant stenosis. Mild compression deformity of the C7 vertebral body with mild marrow edema, likely a subacute compression fracture. No more than mild height loss without bony retropulsion or stenosis. Underlying bone marrow signal intensity within normal limits. No worrisome osseous lesions. Cord:  Normal signal and morphology. Paraspinal and other soft tissues: Paraspinous soft tissues demonstrate no acute finding. Small layering bilateral pleural effusions, left greater than right. Approximate 1 cm T2 hyperintense left renal cyst, benign in appearance, no follow-up imaging recommended. Disc levels: Underlying ordinary for age multilevel disc desiccation with mild disc bulging throughout the thoracic spine. Mild multilevel facet hypertrophy. No significant spinal stenosis within the thoracic spine. Moderate to advance multilevel foraminal narrowing, largely due to compression deformities and facet  degeneration. IMPRESSION: 1. Subacute compression fractures involving the C7, T2, T3, T5, and T7 vertebral bodies as above. No significant associated spinal stenosis. 2. Chronic compression fractures involving the T4, T8, T9, T10, T12, and L1 vertebral bodies with sequelae of prior vertebral augmentation. 3. Underlying mild for age spondylosis without significant spinal stenosis. 4. Small layering bilateral pleural effusions, left greater than right. Electronically Signed   By: Rise Mu M.D.   On: 02/05/2023 05:23   DG Lumbar Spine Complete  Result Date: 02/03/2023 CLINICAL DATA:  89 year old female with history of low back pain after a fall. EXAM: LUMBAR SPINE - COMPLETE 4+ VIEW COMPARISON:  No priors. FINDINGS: Multiple views of the lumbar spine demonstrate no definite acute displaced fracture or acute compression type fracture. Multiple chronic appearing compression fractures are noted at T11, L1, L2, L3 and L4, most severe at T11  where there is up to 50% loss of anterior vertebral body height. Post vertebroplasty changes are noted at T11, L1 and L2. Mild levoscoliosis convex to the left at the level of L1. Severe multilevel degenerative disc disease and facet arthropathy is noted throughout the lumbar spine, with most severe facet arthropathy at L4-L5 and L5-S1. IMPRESSION: 1. While there are multiple chronic appearing compression fractures and post vertebroplasty changes, in addition to extensive multilevel degenerative disc disease and lumbar spondylosis, as above, no definite acute findings are noted. Electronically Signed   By: Trudie Reed M.D.   On: 02/03/2023 05:32   CT HEAD WO CONTRAST ( )  Result Date: 02/03/2023 CLINICAL DATA:  Fall, head and neck trauma, on blood thinners EXAM: CT HEAD WITHOUT CONTRAST CT CERVICAL SPINE WITHOUT CONTRAST TECHNIQUE: Multidetector CT imaging of the head and cervical spine was performed following the standard protocol without intravenous contrast. Multiplanar CT image reconstructions of the cervical spine were also generated. RADIATION DOSE REDUCTION: This exam was performed according to the departmental dose-optimization program which includes automated exposure control, adjustment of the mA and/or kV according to patient size and/or use of iterative reconstruction technique. COMPARISON:  01/23/2023 and 10/30/2022 CT of the thoracic spine FINDINGS: CT HEAD FINDINGS Brain: No evidence of acute infarct, hemorrhage, mass, mass effect, or midline shift. No hydrocephalus or extra-axial fluid collection. Periventricular white matter changes, likely the sequela of chronic small vessel ischemic disease. Remote lacunar infarct in the right thalamus. Vascular: No hyperdense vessel. Atherosclerotic calcifications in the intracranial carotid and vertebral arteries. Skull: Negative for fracture or focal lesion. Sinuses/Orbits: No acute finding. Other: Fluid in left mastoid air cells. CT CERVICAL SPINE  FINDINGS Alignment: No traumatic listhesis. Skull base and vertebrae: Osteopenia. Compression fracture of T2 is new compared to 01/23/2023, likely acute. Approximately 40% vertebral body height loss, with 3 mm retropulsion of the posterosuperior cortex. Unchanged compression deformities of C7 and T3. Status post T4 kyphoplasty. Soft tissues and spinal canal: There is likely trace prevertebral edema anterior to T2. No visible canal hematoma. Disc levels: Degenerative changes in the cervical spine. No significant spinal canal stenosis. Upper chest: Negative. IMPRESSION: 1. No acute intracranial process. 2. Acute compression fracture of T2 with approximately 40% vertebral body height loss and 3 mm retropulsion of the posterosuperior cortex. These findings were discussed by telephone on 02/03/2023 at 4:09 am with provider KELLY HUMES . Electronically Signed   By: Wiliam Ke M.D.   On: 02/03/2023 04:10   CT Cervical Spine Wo Contrast  Result Date: 02/03/2023 CLINICAL DATA:  Fall, head and neck trauma, on blood thinners EXAM: CT HEAD WITHOUT  CONTRAST CT CERVICAL SPINE WITHOUT CONTRAST TECHNIQUE: Multidetector CT imaging of the head and cervical spine was performed following the standard protocol without intravenous contrast. Multiplanar CT image reconstructions of the cervical spine were also generated. RADIATION DOSE REDUCTION: This exam was performed according to the departmental dose-optimization program which includes automated exposure control, adjustment of the mA and/or kV according to patient size and/or use of iterative reconstruction technique. COMPARISON:  01/23/2023 and 10/30/2022 CT of the thoracic spine FINDINGS: CT HEAD FINDINGS Brain: No evidence of acute infarct, hemorrhage, mass, mass effect, or midline shift. No hydrocephalus or extra-axial fluid collection. Periventricular white matter changes, likely the sequela of chronic small vessel ischemic disease. Remote lacunar infarct in the right  thalamus. Vascular: No hyperdense vessel. Atherosclerotic calcifications in the intracranial carotid and vertebral arteries. Skull: Negative for fracture or focal lesion. Sinuses/Orbits: No acute finding. Other: Fluid in left mastoid air cells. CT CERVICAL SPINE FINDINGS Alignment: No traumatic listhesis. Skull base and vertebrae: Osteopenia. Compression fracture of T2 is new compared to 01/23/2023, likely acute. Approximately 40% vertebral body height loss, with 3 mm retropulsion of the posterosuperior cortex. Unchanged compression deformities of C7 and T3. Status post T4 kyphoplasty. Soft tissues and spinal canal: There is likely trace prevertebral edema anterior to T2. No visible canal hematoma. Disc levels: Degenerative changes in the cervical spine. No significant spinal canal stenosis. Upper chest: Negative. IMPRESSION: 1. No acute intracranial process. 2. Acute compression fracture of T2 with approximately 40% vertebral body height loss and 3 mm retropulsion of the posterosuperior cortex. These findings were discussed by telephone on 02/03/2023 at 4:09 am with provider KELLY HUMES . Electronically Signed   By: Wiliam Ke M.D.   On: 02/03/2023 04:10   DG Pelvis Portable  Result Date: 02/03/2023 CLINICAL DATA:  Status post fall. EXAM: PORTABLE PELVIS 1-2 VIEWS COMPARISON:  January 23, 2023 FINDINGS: There is no evidence of an acute pelvic fracture or dislocation. Bilateral proximal femoral intramedullary rods and compression screw device is are seen. Chronic bilateral inferior pubic rami fractures are noted. Marked severity degenerative changes are seen within the visualized portion of the lower lumbar spine. IMPRESSION: 1. No acute pelvic fracture or dislocation. 2. Prior ORIF of bilateral proximal femoral fractures, with chronic fractures of the bilateral inferior pubic rami. Electronically Signed   By: Aram Candela M.D.   On: 02/03/2023 03:43   DG Chest Port 1 View  Result Date:  02/03/2023 CLINICAL DATA:  Status post fall. EXAM: PORTABLE CHEST 1 VIEW COMPARISON:  January 23, 2023 FINDINGS: The heart size and mediastinal contours are within normal limits. Low lung volumes are noted with diffuse, chronic appearing increased interstitial lung markings. Mild atelectasis and/or infiltrate is seen within the left lung base. A small left pleural effusion is seen. No pneumothorax is identified. There are multiple chronic bilateral rib fractures. A chronic fracture deformity is seen involving the head and neck of the proximal right humerus. Multilevel vertebroplasty is noted throughout the thoracic spine. IMPRESSION: 1. Chronic changes with mild left basilar atelectasis and/or infiltrate. 2. Small left pleural effusion. Electronically Signed   By: Aram Candela M.D.   On: 02/03/2023 03:38     Assessment and Plan:   Pre-Op Evaluation Patient has a history of recurrent falls.  She presented after another fall.  Workup showed multiple subacute and chronic compression fractions of her spine.  Radiology is planning a kyphoplasty and Cardiology was asked to give recommendations for awaiting Plavix.  She is on Plavix after an  inferior STEMI in 02/2022 and on Eliquis for atrial fibrillation. She is an extremely frail 89 year. Per Revised Cardiac Risk Index, considered Class IV risk (30 day risk of death, MI, or cardiac arrest = 15%) but this does not take into account her frailty or advanced age. However, she is well optimized from a cardiac standpoint. No additional cardiac work-up necessary prior to procedure. OK to hold Plavix for 5 days as requested. Please restart this as soon as possible after surgery. OK to hold Eliquis as well. Given her recurrent falls, frailty, and advanced age, I think her bleed risks outweighs her stroke risk. We will stop her Eliquis without plans to restart this.  CAD History of inferior STEMI in 02/2022 s/p atherectomy and intracoronary lithotripsy followed by  DES to ostial/proximal RCA. - She describes some vague chest pain that she has trouble describing (poor historian) but sounds most like musculoskeletal pain from falls/ injuries. No additional work-up necessary. - On Plavix 75mg  daily at home. Ok to hold for spinal procedure as above. - Not on Aspirin at home given need for Eliquis. - Continue high-intensity statin.  Chronic HFmrEF Patient has a history of remote Takotsubo cardiomyopathy in the 1990s as well as chronic CHF with EF as low as 40% in the past.  However, EF has since normalized.  Last Echo in 09/2022 showed LVEF of 45-50% with hypokinesis of the basal inferior lateral segment, basal anterior lateral segment, basal inferior segment, and basal inferior septal segment as well as grade 2 diastolic dysfunction, moderate biatrial enlargement, mild AS. - Euvolemic on exam. - On Lasix 20mg  daily at home. This is currently on hold. Can resume if needed.  Paroxysmal Atrial Fibrillation Initially diagnosed in 02/2022 during admission for STEMI. - Maintaining sinus rhythm on exam. - On chronic anticoagulation with Eliquis 2.5 mg twice daily (reduced dosing due to age and weight). Will stop this given multiple falls, frailty, and advanced age as bleed risk outweighs stroke risk at this point.  Hypertension BP elevated with systolic BP in the 140s to 170s.  - Not on any antihypertensive medications at home. - Elevated BP may partly be due to pain.  - Continue PRN Hydralazine for now.  - Will defer management to primary team.  Otherwise, per primary team: - Compression fractures  - Chronic respiratory failure with hypoxia - Leukocytosis - Polycythemia - GERD - Anxiety/ depression  Risk Assessment/Risk Scores:    New York Heart Association (NYHA) Functional Class NYHA Class III. She has chronic respiratory failure and is on PRN O2 at home. Frailty and advanced age also contribute to functional status.  CHA2DS2-VASc Score = 8  This  indicates a 10.8% annual risk of stroke. The patient's score is based upon: CHF History: 1 HTN History: 1 Diabetes History: 0 Stroke History: 2 Vascular Disease History: 1 Age Score: 2 Gender Score: 1   For questions or updates, please contact [facility] Please consult [email or web address] for contact info under    Signed, Corrin Parker, PA-C  02/05/2023 4:35 PM   ATTENDING ATTESTATION:  After conducting a review of all available clinical information with the care team, interviewing the patient, and performing a physical exam, I agree with the findings and plan described in this note.   GEN: No acute distress, frail HEENT:  Dry MM, no JVD, no scleral icterus Cardiac: RRR with soft systolic murmur; TTP diffuse left chest and shoulder area Respiratory: Full examination limited due to musculoskeletal pain; anterior lung fields seem  clear GI: Soft, nontender, non-distended  MS: No edema; No deformity. Neuro:  Nonfocal  Vasc:  +1 radial pulses  The patient is a 89 year old female with a history of coronary artery disease status post aborted inferior ST elevation myocardial infarction treated with staged complex PCI of the ostial right coronary artery, paroxysmal atrial fibrillation on Eliquis, hypertension, hyperlipidemia, subdural hematoma in May 2024 who lives in assisted living who is here for recurrent fall.  She is experienced many falls since 2020 but the most serious seems to be in May of this year where she developed a subdural hematoma.  She presents again with an unwitnessed fall.  Her head CT was negative for intracranial hemorrhage however further imaging demonstrated subacute compression fractures of the cervical and thoracic vertebral bodies.  There are plans for kyphoplasty.  Cardiology is consulted regarding anticoagulation and antiplatelet management.  The patient is a very poor historian and has likely an element of dementia.  She has very poor anticoagulation  candidate.  She is quite frail as well.  I think the best thing is to discontinue her Eliquis indefinitely.  We can stop her Plavix now and resume Plavix monotherapy following the kyphoplasty.  No further evaluation is required at this point in time from a cardiovascular perspective.  Will sign off, please call with questions.  Will arrange cardiology follow-up.   Alverda Skeans, MD Pager [phone]

## 2023-02-05 NOTE — Consult Note (Signed)
Chief Complaint: Patient was seen in consultation today for T2, T3, T5 compression fractures  Referring Physician(s): Kathlen Mody, MD  Supervising Physician: Julieanne Cotton  Patient Status: [facility] - In-pt  History of Present Illness: [name] is a 89 y.o. female with PMH significant for breast cancer, CAD, cardiomyopathy, chronic CHF, CVA, DVT, GERD, hiatal hernia, hypertension, OSA, and polycythemia being seen today in relation to T2, T3, and T5 compression fractures. Patient was admitted after a fall, and evaluation revealed subacute compression fractures of the above thoracic vertebrae. MR from 02/04/23 revealed the following:  1. Subacute compression fractures involving the C7, T2, T3, T5, and T7 vertebral bodies as above. No significant associated spinal stenosis. 2. Chronic compression fractures involving the T4, T8, T9, T10, T12, and L1 vertebral bodies with sequelae of prior vertebral augmentation. 3. Underlying mild for age spondylosis without significant spinal stenosis. 4. Small layering bilateral pleural effusions, left greater than right.  Patient admitted to hospitalist service for management of severe pain. NIR consulted to evaluate for possible kyphoplasty/vertebroplasty.  Past Medical History:  Diagnosis Date   CAD (coronary artery disease)    mild nonobstructive disease on cath in 2003   Cancer [facility])    Cardiomyopathy    Probable Takotsubo, severe CP w/ normal cath in 1994. Severe CP in 2003 w/ widespread T wave inversions on ECG. Cath w/ minimal coronary disease but LV-gram showed periapical severe hypokinesis and basilar hyperkinesia (EF 40%). Last echo in 4/09 confirmed full LV functional recovery with EF 60%, no regional wall motion abnormalities, mild to moderate LVH.   Chronic diastolic CHF (congestive heart failure) (HCC) 09/13/2019   CVA (cerebral infarction)    Depression with anxiety 03/29/2011   lost husband 3'09   DVT (deep venous  thrombosis) (HCC)    after venous ablation, R leg   Fracture 09/10/2007   L2, status post vertebroplasty of L2 performed by IR   GERD (gastroesophageal reflux disease)    Hemorrhoids    Hiatal hernia 03/29/2011   no nerve problems   Hyperlipidemia    Hyperplastic colon polyp 06/2001   Hypertension    Iron deficiency anemia due to chronic blood loss 12/30/2017   OA (osteoarthritis) of knee 03/29/2011   w/ bilateral knee pain-not a problem now   OSA (obstructive sleep apnea) 03/29/2011   no cpap used, not a problem now.   Osteoporosis    Otalgia of both ears    Dr. Emeline Darling   Polycythemia    Stroke [facility]) 03/29/2011   CVA x2 -last 10'12-?TIA(visual problems)   Varicose vein     Past Surgical History:  Procedure Laterality Date   APPENDECTOMY     BACK SURGERY     BREAST BIOPSY     BREAST CYST EXCISION     BREAST LUMPECTOMY Left 2013   left stage I left breast cancer   CHOLECYSTECTOMY  04/09/2011   Procedure: LAPAROSCOPIC CHOLECYSTECTOMY WITH INTRAOPERATIVE CHOLANGIOGRAM;  Surgeon: Adolph Pollack, MD;  Location: [facility] ORS;  Service: General;  Laterality: N/A;   CORONARY ATHERECTOMY N/A 02/26/2022   Procedure: CORONARY ATHERECTOMY;  Surgeon: Yvonne Kendall, MD;  Location: [facility] INVASIVE CV LAB;  Service: Cardiovascular;  Laterality: N/A;   CORONARY LITHOTRIPSY N/A 02/26/2022   Procedure: CORONARY LITHOTRIPSY;  Surgeon: Yvonne Kendall, MD;  Location: [facility] INVASIVE CV LAB;  Service: Cardiovascular;  Laterality: N/A;   CORONARY STENT INTERVENTION N/A 02/26/2022   Procedure: CORONARY STENT INTERVENTION;  Surgeon: Yvonne Kendall, MD;  Location: [facility] INVAS[facility] CV LAB;  Service: Cardiovascular;  Laterality: N/A;   CORONARY ULTRASOUND/IVUS N/A 02/26/2022   Procedure: Intravascular Ultrasound/IVUS;  Surgeon: Yvonne Kendall, MD;  Location: [facility] CV LAB;  Service: Cardiovascular;  Laterality: N/A;   Dental Extraction     L maxillary molar   FEMUR IM NAIL Right 07/25/2022   Procedure:  INTRAMEDULLARY (IM) NAIL FEMORAL;  Surgeon: Yolonda Kida, MD;  Location: [facility] OR;  Service: Orthopedics;  Laterality: Right;  hana table,carm.synthesis   INTRAMEDULLARY (IM) NAIL INTERTROCHANTERIC Left 12/25/2019   Procedure: INTRAMEDULLARY (IM) NAIL INTERTROCHANTRIC;  Surgeon: Durene Romans, MD;  Location: [facility] ORS;  Service: Orthopedics;  Laterality: Left;   IR KYPHO THORACIC WITH BONE BIOPSY  09/15/2019   IR KYPHO THORACIC WITH BONE BIOPSY  07/13/2020       IR KYPHO THORACIC WITH BONE BIOPSY  07/13/2020   IR VERTEBROPLASTY CERV/THOR BX INC UNI/BIL INC/INJECT/IMAGING  11/10/2019   KYPHOPLASTY N/A 11/16/2020   Procedure: T11,  L1,KYPHOPLASTY;  Surgeon: Venita Lick, MD;  Location: [facility] OR;  Service: Orthopedics;  Laterality: N/A;   KYPHOSIS SURGERY  08/2007   cement used   LEFT HEART CATH AND CORONARY ANGIOGRAPHY N/A 02/23/2022   Procedure: LEFT HEART CATH AND CORONARY ANGIOGRAPHY;  Surgeon: Swaziland, Peter M, MD;  Location: [facility] INVASIVE CV LAB;  Service: Cardiovascular;  Laterality: N/A;   LEFT HEART CATH AND CORONARY ANGIOGRAPHY N/A 02/26/2022   Procedure: LEFT HEART CATH AND CORONARY ANGIOGRAPHY;  Surgeon: Yvonne Kendall, MD;  [facility];  Service: Cardiovascular;  Laterality: N/A;   OVARIAN CYST SURGERY     left   RADIOLOGY WITH ANESTHESIA N/A 11/10/2019   Procedure: VERTEBROPLASTY;  Surgeon: Julieanne Cotton, MD;  Location: [facility] OR;  Service: Radiology;  Laterality: N/A;   RADIOLOGY WITH ANESTHESIA N/A 10/04/2022   Procedure: T3 vertebroplasty;  Surgeon: Julieanne Cotton, MD;  Location: [facility] OR;  Service: Radiology;  Laterality: N/A;   TEMPORARY PACEMAKER N/A 02/26/2022   Procedure: TEMPORARY PACEMAKER;  Surgeon: Yvonne Kendall, MD;  [facility]: [facility] CV LAB;  Service: Cardiovascular;  Laterality: N/A;   TONSILLECTOMY     TOTAL KNEE ARTHROPLASTY  06/2010   left   TUBAL LIGATION      Allergies: Penicillins, Valsartan, Atorvastatin, Carvedilol, Ezetimibe, Fluvastatin  sodium, Magnesium hydroxide, Meloxicam, Pneumovax [pneumococcal polysaccharide vaccine], Quinapril hcl, Simvastatin, Topamax [topiramate], and Vit d-vit e-safflower oil  Medications: Prior to Admission medications   Medication Sig Start Date End Date Taking? Authorizing Provider  acetaminophen (TYLENOL) 500 MG tablet Take 1,000 mg by mouth every 8 (eight) hours as needed (for pain).   Yes [provider]  alendronate (FOSAMAX) 70 MG tablet Take 1 tablet (70 mg total) by mouth once a week. Take with a full glass of water on an empty stomach. 12/19/21  Yes Paz, Nolon Rod, MD  apixaban (ELIQUIS) 2.5 MG TABS tablet Take 1 tablet (2.5 mg total) by mouth 2 (two) times daily. 02/28/22  Yes Arty Baumgartner, NP  calcium carbonate (TUMS - DOSED IN MG ELEMENTAL CALCIUM) 500 MG chewable tablet Chew 1 tablet (200 mg of elemental calcium total) by mouth 3 (three) times daily as needed for indigestion or heartburn. 07/28/22  Yes Dorcas Carrow, MD  clopidogrel (PLAVIX) 75 MG tablet Take 1 tablet (75 mg total) by mouth daily. 03/01/22  Yes Laverda Page B, NP  docusate sodium (COLACE) 100 MG capsule Take 1 capsule (100 mg total) by mouth 2 (two) times daily. 07/27/22  Yes Dorcas Carrow, MD  furosemide (LASIX) 20 MG tablet  Take 20 mg by mouth daily. 01/29/23  Yes [provider]  HYDROcodone-acetaminophen (NORCO/VICODIN) 5-325 MG tablet Take 1 tablet by mouth 3 (three) times daily as needed. 01/28/23  Yes [provider]  lidocaine (LIDODERM) 5 % Place 1 patch onto the skin daily. Remove & Discard patch within 12 hours or as directed by MD Patient taking differently: Place 1 patch onto the skin daily. Remove & Discard patch within 12 hours or as directed by MD Apply to right shoulder and left rib area 10/10/22  Yes Gonfa, Taye T, MD  LORazepam (ATIVAN) 0.5 MG tablet Take 0.25 mg by mouth 2 (two) times daily.   Yes [provider]  nitroGLYCERIN (NITROSTAT) 0.4 MG SL tablet Place 1  tablet (0.4 mg total) under the tongue every 5 (five) minutes x 3 doses as needed for chest pain. 02/28/22  Yes Laverda Page B, NP  ondansetron (ZOFRAN-ODT) 4 MG disintegrating tablet Take 4 mg by mouth every 8 (eight) hours as needed for nausea or vomiting. 08/24/22  Yes [provider]  pantoprazole (PROTONIX) 40 MG tablet Take 1 tablet (40 mg total) by mouth at bedtime. Patient taking differently: Take 40 mg by mouth 2 (two) times daily. 02/28/22  Yes Laverda Page B, NP  rosuvastatin (CRESTOR) 20 MG tablet Take 1 tablet (20 mg total) by mouth daily. Patient taking differently: Take 20 mg by mouth at bedtime. 03/01/22  Yes Laverda Page B, NP  sertraline (ZOLOFT) 25 MG tablet Take 25 mg by mouth at bedtime. 06/18/22  Yes [provider]  UNABLE TO FIND Take 120 mLs by mouth in the morning, at noon, and at bedtime. Med Name: Med pass   Yes [provider]  UNABLE TO FIND Take 1 Dose by mouth 2 (two) times daily with a meal. Med Name: Magic Cup   Yes [provider]     Family History  Problem Relation Age of Onset   Breast cancer Mother    Heart disease Father        MIs   Colon cancer Other        cousin   Breast cancer Sister    Leukemia Brother        GM    Social History   Socioeconomic History   Marital status: Widowed    Spouse name: Not on file   Number of children: 3   Years of education: Not on file   Highest education level: Not on file  Occupational History   Occupation: n/a  Tobacco Use   Smoking status: Some Days    Current packs/day: 1.00    Average packs/day: 1 pack/day for 71.6 years (71.6 ttl pk-yrs)    Types: Cigarettes    Start date: 06/26/1949   Smokeless tobacco: Never   Tobacco comments:    quit 25 years ago  Vaping Use   Vaping status: Never Used  Substance and Sexual Activity   Alcohol use: Not Currently    Alcohol/week: 1.0 standard drink of alcohol    Types: 1 Glasses of wine per week   Drug use: No    Sexual activity: Not Currently  Other Topics Concern   Not on file  Social History Narrative   Lost husband 07/29/07-    Lives in [place]   has 3 daughters, lost Vernona Rieger (03-2017)   Moved to ALF 04-2021   [name] lives in [place], [place] ( [place])     Not driving as of [phone]  Social Determinants of Health   Financial Resource Strain: Low Risk  (08/10/2021)   Overall Financial Resource Strain (CARDIA)    Difficulty of Paying Living Expenses: Not hard at all  Food Insecurity: No Food Insecurity (02/03/2023)   Hunger Vital Sign    Worried About Running Out of Food in the Last Year: Never true    Ran Out of Food in the Last Year: Never true  Transportation Needs: No Transportation Needs (02/03/2023)   PRAPARE - Administrator, Civil Service (Medical): No    Lack of Transportation (Non-Medical): No  Physical Activity: Not on file  Stress: Not on file  Social Connections: Unknown (10/03/2021)   [facility]   Social Network    Social Network: Not on file    Code Status: Full Code  Review of Systems: A 12 point ROS discussed and pertinent positives are indicated in the HPI above.  All other systems are negative.  Review of Systems  Constitutional:  Negative for chills and fever.  Respiratory:  Negative for chest tightness and shortness of breath.   Cardiovascular:  Positive for chest pain. Negative for leg swelling.       Patient with concerns of extreme sternal pain with movement or touch  Gastrointestinal:  Negative for abdominal pain, diarrhea, nausea and vomiting.  Musculoskeletal:  Positive for back pain.       9/10 back pain reported  Psychiatric/Behavioral:  Negative for confusion.     Vital Signs: BP (!) 177/96 (BP Location: Left Arm)   Pulse 76   Temp 97.6 F (36.4 C) (Oral)   Resp 18   Wt 104 lb 4.4 oz (47.3 kg)   SpO2 94%   BMI 17.35 kg/m    Physical Exam Vitals reviewed.  Constitutional:      General:  She is in acute distress.     Appearance: She is not ill-appearing.  Cardiovascular:     Rate and Rhythm: Normal rate and regular rhythm.     Pulses: Normal pulses.     Heart sounds: Normal heart sounds.  Pulmonary:     Effort: Pulmonary effort is normal.     Breath sounds: Normal breath sounds.  Abdominal:     Palpations: Abdomen is soft.     Tenderness: There is no abdominal tenderness.  Musculoskeletal:        General: Tenderness present.     Right lower leg: No edema.     Left lower leg: No edema.     Comments: 9/10 back pain when palpated, patient in extreme discomfort with any movement  Skin:    General: Skin is warm and dry.  Neurological:     Mental Status: She is alert and oriented to person, place, and time.  Psychiatric:        Mood and Affect: Mood normal.        Behavior: Behavior normal.        Thought Content: Thought content normal.        Judgment: Judgment normal.     Imaging: MR THORACIC SPINE WO CONTRAST  Result Date: 02/05/2023 CLINICAL DATA:  Initial evaluation for compression fracture. EXAM: MRI THORACIC SPINE WITHOUT CONTRAST TECHNIQUE: Multiplanar, multisequence MR imaging of the thoracic spine was performed. No intravenous contrast was administered. COMPARISON:  Prior CT from 01/23/2023. FINDINGS: Alignment:  Examination degraded by motion artifact. Exaggeration of the normal thoracic kyphosis. No significant listhesis. Vertebrae: Chronic compression fractures involving the T4, T7, T8, T9, T10,  T12, and L1 vertebral bodies with sequelae of prior vertebral augmentation. Mild bony retropulsion about several of these fractures without significant stenosis. Compression fracture involving the T2 vertebral body with mild 30% height loss and trace 2 mm retropulsion. Persistent mild marrow edema, consistent with a probable subacute compression fracture. No significant stenosis. Compression fracture of the T3 vertebral body with advanced 80% height loss and 3 mm bony  retropulsion. Persistent marrow edema, consistent with a subacute compression fracture. No significant stenosis. Fracture deformity of the T5 vertebral body without associated height loss or retropulsion, likely subacute in nature. Fracture deformity of the T7 vertebral body with advanced 70-80% height loss and trace 2 mm bony retropulsion. Persistent mild marrow edema, consistent with a probable subacute compression fracture. No significant stenosis. Mild compression deformity of the C7 vertebral body with mild marrow edema, likely a subacute compression fracture. No more than mild height loss without bony retropulsion or stenosis. Underlying bone marrow signal intensity within normal limits. No worrisome osseous lesions. Cord:  Normal signal and morphology. Paraspinal and other soft tissues: Paraspinous soft tissues demonstrate no acute finding. Small layering bilateral pleural effusions, left greater than right. Approximate 1 cm T2 hyperintense left renal cyst, benign in appearance, no follow-up imaging recommended. Disc levels: Underlying ordinary for age multilevel disc desiccation with mild disc bulging throughout the thoracic spine. Mild multilevel facet hypertrophy. No significant spinal stenosis within the thoracic spine. Moderate to advance multilevel foraminal narrowing, largely due to compression deformities and facet degeneration. IMPRESSION: 1. Subacute compression fractures involving the C7, T2, T3, T5, and T7 vertebral bodies as above. No significant associated spinal stenosis. 2. Chronic compression fractures involving the T4, T8, T9, T10, T12, and L1 vertebral bodies with sequelae of prior vertebral augmentation. 3. Underlying mild for age spondylosis without significant spinal stenosis. 4. Small layering bilateral pleural effusions, left greater than right. Electronically Signed   By: Rise Mu M.D.   On: 02/05/2023 05:23   DG Lumbar Spine Complete  Result Date: 02/03/2023 CLINICAL  DATA:  89 year old female with history of low back pain after a fall. EXAM: LUMBAR SPINE - COMPLETE 4+ VIEW COMPARISON:  No priors. FINDINGS: Multiple views of the lumbar spine demonstrate no definite acute displaced fracture or acute compression type fracture. Multiple chronic appearing compression fractures are noted at T11, L1, L2, L3 and L4, most severe at T11 where there is up to 50% loss of anterior vertebral body height. Post vertebroplasty changes are noted at T11, L1 and L2. Mild levoscoliosis convex to the left at the level of L1. Severe multilevel degenerative disc disease and facet arthropathy is noted throughout the lumbar spine, with most severe facet arthropathy at L4-L5 and L5-S1. IMPRESSION: 1. While there are multiple chronic appearing compression fractures and post vertebroplasty changes, in addition to extensive multilevel degenerative disc disease and lumbar spondylosis, as above, no definite acute findings are noted. Electronically Signed   By: Trudie Reed M.D.   On: 02/03/2023 05:32   CT HEAD WO CONTRAST ( )  Result Date: 02/03/2023 CLINICAL DATA:  Fall, head and neck trauma, on blood thinners EXAM: CT HEAD WITHOUT CONTRAST CT CERVICAL SPINE WITHOUT CONTRAST TECHNIQUE: Multidetector CT imaging of the head and cervical spine was performed following the standard protocol without intravenous contrast. Multiplanar CT image reconstructions of the cervical spine were also generated. RADIATION DOSE REDUCTION: This exam was performed according to the departmental dose-optimization program which includes automated exposure control, adjustment of the mA and/or kV according to patient size and/or  use of iterative reconstruction technique. COMPARISON:  01/23/2023 and 10/30/2022 CT of the thoracic spine FINDINGS: CT HEAD FINDINGS Brain: No evidence of acute infarct, hemorrhage, mass, mass effect, or midline shift. No hydrocephalus or extra-axial fluid collection. Periventricular white matter  changes, likely the sequela of chronic small vessel ischemic disease. Remote lacunar infarct in the right thalamus. Vascular: No hyperdense vessel. Atherosclerotic calcifications in the intracranial carotid and vertebral arteries. Skull: Negative for fracture or focal lesion. Sinuses/Orbits: No acute finding. Other: Fluid in left mastoid air cells. CT CERVICAL SPINE FINDINGS Alignment: No traumatic listhesis. Skull base and vertebrae: Osteopenia. Compression fracture of T2 is new compared to 01/23/2023, likely acute. Approximately 40% vertebral body height loss, with 3 mm retropulsion of the posterosuperior cortex. Unchanged compression deformities of C7 and T3. Status post T4 kyphoplasty. Soft tissues and spinal canal: There is likely trace prevertebral edema anterior to T2. No visible canal hematoma. Disc levels: Degenerative changes in the cervical spine. No significant spinal canal stenosis. Upper chest: Negative. IMPRESSION: 1. No acute intracranial process. 2. Acute compression fracture of T2 with approximately 40% vertebral body height loss and 3 mm retropulsion of the posterosuperior cortex. These findings were discussed by telephone on 02/03/2023 at 4:09 am with provider KELLY HUMES . Electronically Signed   By: Wiliam Ke M.D.   On: 02/03/2023 04:10   CT Cervical Spine Wo Contrast  Result Date: 02/03/2023 CLINICAL DATA:  Fall, head and neck trauma, on blood thinners EXAM: CT HEAD WITHOUT CONTRAST CT CERVICAL SPINE WITHOUT CONTRAST TECHNIQUE: Multidetector CT imaging of the head and cervical spine was performed following the standard protocol without intravenous contrast. Multiplanar CT image reconstructions of the cervical spine were also generated. RADIATION DOSE REDUCTION: This exam was performed according to the departmental dose-optimization program which includes automated exposure control, adjustment of the mA and/or kV according to patient size and/or use of iterative reconstruction technique.  COMPARISON:  01/23/2023 and 10/30/2022 CT of the thoracic spine FINDINGS: CT HEAD FINDINGS Brain: No evidence of acute infarct, hemorrhage, mass, mass effect, or midline shift. No hydrocephalus or extra-axial fluid collection. Periventricular white matter changes, likely the sequela of chronic small vessel ischemic disease. Remote lacunar infarct in the right thalamus. Vascular: No hyperdense vessel. Atherosclerotic calcifications in the intracranial carotid and vertebral arteries. Skull: Negative for fracture or focal lesion. Sinuses/Orbits: No acute finding. Other: Fluid in left mastoid air cells. CT CERVICAL SPINE FINDINGS Alignment: No traumatic listhesis. Skull base and vertebrae: Osteopenia. Compression fracture of T2 is new compared to 01/23/2023, likely acute. Approximately 40% vertebral body height loss, with 3 mm retropulsion of the posterosuperior cortex. Unchanged compression deformities of C7 and T3. Status post T4 kyphoplasty. Soft tissues and spinal canal: There is likely trace prevertebral edema anterior to T2. No visible canal hematoma. Disc levels: Degenerative changes in the cervical spine. No significant spinal canal stenosis. Upper chest: Negative. IMPRESSION: 1. No acute intracranial process. 2. Acute compression fracture of T2 with approximately 40% vertebral body height loss and 3 mm retropulsion of the posterosuperior cortex. These findings were discussed by telephone on 02/03/2023 at 4:09 am with provider KELLY HUMES . Electronically Signed   By: Wiliam Ke M.D.   On: 02/03/2023 04:10   DG Pelvis Portable  Result Date: 02/03/2023 CLINICAL DATA:  Status post fall. EXAM: PORTABLE PELVIS 1-2 VIEWS COMPARISON:  January 23, 2023 FINDINGS: There is no evidence of an acute pelvic fracture or dislocation. Bilateral proximal femoral intramedullary rods and compression screw device is are seen.  Chronic bilateral inferior pubic rami fractures are noted. Marked severity degenerative changes are  seen within the visualized portion of the lower lumbar spine. IMPRESSION: 1. No acute pelvic fracture or dislocation. 2. Prior ORIF of bilateral proximal femoral fractures, with chronic fractures of the bilateral inferior pubic rami. Electronically Signed   By: Aram Candela M.D.   On: 02/03/2023 03:43   DG Chest Port 1 View  Result Date: 02/03/2023 CLINICAL DATA:  Status post fall. EXAM: PORTABLE CHEST 1 VIEW COMPARISON:  January 23, 2023 FINDINGS: The heart size and mediastinal contours are within normal limits. Low lung volumes are noted with diffuse, chronic appearing increased interstitial lung markings. Mild atelectasis and/or infiltrate is seen within the left lung base. A small left pleural effusion is seen. No pneumothorax is identified. There are multiple chronic bilateral rib fractures. A chronic fracture deformity is seen involving the head and neck of the proximal right humerus. Multilevel vertebroplasty is noted throughout the thoracic spine. IMPRESSION: 1. Chronic changes with mild left basilar atelectasis and/or infiltrate. 2. Small left pleural effusion. Electronically Signed   By: Aram Candela M.D.   On: 02/03/2023 03:38   CT HEAD WO CONTRAST  Result Date: 01/23/2023 CLINICAL DATA:  Trauma. EXAM: CT HEAD WITHOUT CONTRAST CT CERVICAL SPINE WITHOUT CONTRAST TECHNIQUE: Multidetector CT imaging of the head and cervical spine was performed following the standard protocol without intravenous contrast. Multiplanar CT image reconstructions of the cervical spine were also generated. RADIATION DOSE REDUCTION: This exam was performed according to the departmental dose-optimization program which includes automated exposure control, adjustment of the mA and/or kV according to patient size and/or use of iterative reconstruction technique. COMPARISON:  Head CT dated 09/29/2022. Thoracic spine CT dated 10/30/2022. FINDINGS: CT HEAD FINDINGS Brain: Moderate age-related atrophy and advanced  chronic microvascular ischemic changes. Right colonic old lacunar infarct. There is no acute intracranial hemorrhage. No mass effect or midline shift no extra-axial fluid collection. Vascular: No hyperdense vessel or unexpected calcification. Skull: Normal. Negative for fracture or focal lesion. Sinuses/Orbits: The visualized paranasal sinuses and the right mastoid air cells are clear. Minimal left mastoid effusion. Other: Left posterior scalp contusion. CT CERVICAL SPINE FINDINGS Alignment: No acute subluxation. Skull base and vertebrae: Evaluation of the osseous structures is very limited due to severe osteoporosis. No definite acute fracture. Compression fracture of C7 as well as partially visualized fracture of T3 also seen on the CT of 10/30/2022. Soft tissues and spinal canal: There is induration and thickening of the anterior paravertebral soft tissue at T3-T4 concerning for small hematoma (76/5). Disc levels:  No acute findings. Upper chest: No acute findings. Other: Bilateral carotid bulb calcified plaques. IMPRESSION: 1. No acute intracranial pathology. 2. Moderate age-related atrophy and advanced chronic microvascular ischemic changes. 3. No acute/traumatic cervical spine pathology. 4. Compression fracture of C7 as well as partially visualized fracture of T3 also seen on the CT of 10/30/2022. 5. Small anterior paravertebral hematoma at T3-T4. These results were called by telephone at the time of interpretation on 01/23/2023 at 11:18 pm to provider [facility] , who verbally acknowledged these results. Electronically Signed   By: Elgie Collard M.D.   On: 01/23/2023 23:22   CT CERVICAL SPINE WO CONTRAST  Result Date: 01/23/2023 CLINICAL DATA:  Trauma. EXAM: CT HEAD WITHOUT CONTRAST CT CERVICAL SPINE WITHOUT CONTRAST TECHNIQUE: Multidetector CT imaging of the head and cervical spine was performed following the standard protocol without intravenous contrast. Multiplanar CT image reconstructions of the  cervical spine were also  generated. RADIATION DOSE REDUCTION: This exam was performed according to the departmental dose-optimization program which includes automated exposure control, adjustment of the mA and/or kV according to patient size and/or use of iterative reconstruction technique. COMPARISON:  Head CT dated 09/29/2022. Thoracic spine CT dated 10/30/2022. FINDINGS: CT HEAD FINDINGS Brain: Moderate age-related atrophy and advanced chronic microvascular ischemic changes. Right colonic old lacunar infarct. There is no acute intracranial hemorrhage. No mass effect or midline shift no extra-axial fluid collection. Vascular: No hyperdense vessel or unexpected calcification. Skull: Normal. Negative for fracture or focal lesion. Sinuses/Orbits: The visualized paranasal sinuses and the right mastoid air cells are clear. Minimal left mastoid effusion. Other: Left posterior scalp contusion. CT CERVICAL SPINE FINDINGS Alignment: No acute subluxation. Skull base and vertebrae: Evaluation of the osseous structures is very limited due to severe osteoporosis. No definite acute fracture. Compression fracture of C7 as well as partially visualized fracture of T3 also seen on the CT of 10/30/2022. Soft tissues and spinal canal: There is induration and thickening of the anterior paravertebral soft tissue at T3-T4 concerning for small hematoma (76/5). Disc levels:  No acute findings. Upper chest: No acute findings. Other: Bilateral carotid bulb calcified plaques. IMPRESSION: 1. No acute intracranial pathology. 2. Moderate age-related atrophy and advanced chronic microvascular ischemic changes. 3. No acute/traumatic cervical spine pathology. 4. Compression fracture of C7 as well as partially visualized fracture of T3 also seen on the CT of 10/30/2022. 5. Small anterior paravertebral hematoma at T3-T4. These results were called by telephone at the time of interpretation on 01/23/2023 at 11:18 pm to provider [facility] , who  verbally acknowledged these results. Electronically Signed   By: Elgie Collard M.D.   On: 01/23/2023 23:22   CT CHEST ABDOMEN PELVIS W CONTRAST  Result Date: 01/23/2023 CLINICAL DATA:  Trauma.  Fall. EXAM: CT CHEST, ABDOMEN, AND PELVIS WITH CONTRAST TECHNIQUE: Multidetector CT imaging of the chest, abdomen and pelvis was performed following the standard protocol during bolus administration of intravenous contrast. RADIATION DOSE REDUCTION: This exam was performed according to the departmental dose-optimization program which includes automated exposure control, adjustment of the mA and/or kV according to patient size and/or use of iterative reconstruction technique. CONTRAST:  75mL OMNIPAQUE IOHEXOL 350 MG/ML SOLN COMPARISON:  Thoracic spine x-ray 10/30/2022. CT chest abdomen and pelvis 09/28/2022. FINDINGS: CT CHEST FINDINGS Cardiovascular: Heart is enlarged. Aorta is ectatic. There are atherosclerotic calcifications of the aorta and coronary arteries. There is no pericardial effusion. Mediastinum/Nodes: There is a large hiatal hernia containing the proximal stomach. There is mild fluid distention of the esophagus. The thyroid gland is within normal limits. There are calcified subcarinal and right hilar lymph nodes. No mediastinal hematoma or pneumomediastinum identified. Lungs/Pleura: There are atelectatic changes in the bilateral lower lobes. There is no pleural effusion or pneumothorax. Trachea and central airways are patent. There is scarring in the right lung apex. Musculoskeletal: There are numerous healed bilateral rib fractures. No acute fractures are seen. The bones are osteopenic. There are degenerative changes of the right shoulder. Vertebroplasty changes are noted at T4, T8, T9, T10 and T12 similar to prior study. Chronic compression deformity of T7 appears stable. There has been progression of compression deformity of T3. CT ABDOMEN PELVIS FINDINGS Hepatobiliary: Indeterminate lesion in the left  lobe of the liver measures 2.2 x 2.8 cm image 3/51 this appears similar to prior. The liver is enlarged, unchanged. Gallbladder is surgically absent. There is no significant biliary ductal dilatation. Pancreas: Unremarkable. No pancreatic ductal dilatation  or surrounding inflammatory changes. Spleen: No splenic injury or perisplenic hematoma. Calcified granulomas are present. Adrenals/Urinary Tract: No adrenal hemorrhage or renal injury identified. Bladder is unremarkable. Stomach/Bowel: No evidence of bowel wall thickening, distention, or inflammatory changes. The appendix is not seen. Vascular/Lymphatic: Aortic atherosclerosis. No enlarged abdominal or pelvic lymph nodes. Reproductive: Status post hysterectomy. No adnexal masses. Other: No abdominal wall hernia or abnormality. No abdominopelvic ascites. Musculoskeletal: The bones are diffusely osteopenic. Bilateral hip screws are present. Vertebroplasty changes at L1 and L2 are again noted. Mild compression deformities of L3 on L4 are unchanged. There are healed inferior pubic rami fractures bilaterally. No acute fractures are seen. IMPRESSION: 1. Progression of T3 compression deformity is age indeterminate. Correlate for point tenderness. 2. No other acute posttraumatic sequelae in the chest, abdomen or pelvis. 3. Cardiomegaly. 4. Large hiatal hernia. 5. Indeterminate lesion in the left lobe of the liver appears similar to prior. This can be further evaluated with MRI if clinically appropriate. Aortic Atherosclerosis (ICD10-I70.0). Electronically Signed   By: Darliss Cheney M.D.   On: 01/23/2023 23:02   DG Pelvis Portable  Result Date: 01/23/2023 CLINICAL DATA:  Trauma EXAM: PORTABLE PELVIS 1-2 VIEWS COMPARISON:  09/28/2022 FINDINGS: SI joint degenerative change. Pubic symphysis appears intact. Old inferior pubic rami fractures. Intramedullary rods and fixating screws within the proximal femurs with remote fracture deformities. No acute fracture or dislocation  IMPRESSION: No acute osseous abnormality. Old fracture deformities of the inferior pubic rami and proximal humerus status post bilateral femoral intramedullary rodding. Electronically Signed   By: Jasmine Pang M.D.   On: 01/23/2023 22:27   DG Chest Port 1 View  Result Date: 01/23/2023 CLINICAL DATA:  Trauma. EXAM: PORTABLE CHEST 1 VIEW COMPARISON:  Chest radiograph dated 01/03/2023. FINDINGS: No focal consolidation, pleural effusion, pneumothorax. Bibasilar linear atelectasis/scarring. Stable cardiac silhouette. Severe osteopenia with multilevel old compression fracture and vertebroplasty. 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 Probable old right rib fractures. Multiple surgical clips in the left breast. No definite acute osseous pathology. Evaluation of the osseous structures however is limited due to severe osteopenia. IMPRESSION: 1. No acute cardiopulmonary process. 2. 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 Electronically Signed   By: Elgie Collard M.D.   On: 01/23/2023 22:26    Labs:  CBC: Recent Labs    01/03/23 1154 01/23/23 2121 01/23/23 2140 02/03/23 0255 02/04/23 0350  WBC 6.6 10.2  --  12.3* 7.7  HGB 12.4 13.8 16.0* 15.4* 12.8  HCT 41.5 46.1* 47.0* 49.3* 42.3  PLT 387 404*  --  508* 464*    COAGS: Recent Labs    02/23/22 1507 09/28/22 2256 01/23/23 2121 02/03/23 0255  INR 1.1 1.4* 1.1 1.1  APTT 34  --   --   --     BMP: Recent Labs    01/03/23 1154 01/23/23 2121 01/23/23 2140 02/03/23 0255 02/04/23 0350  NA 137 138 138 132* 133*  K 3.7 3.7 3.6 3.4* 3.8  CL 98 99 98 91* 96*  CO2 28 28  --  29 27  GLUCOSE 89 147* 145* 120* 100*  BUN 8 8 9  <5* 8  CALCIUM 9.3 9.0  --  9.1 8.7*  CREATININE 0.70 0.77 0.80 0.64 0.82  GFRNONAA >60 >60  --  >60 >60    LIVER FUNCTION TESTS: Recent Labs    02/23/22 1741 06/20/22 1154 09/28/22 2256 10/05/22 0906 10/07/22 0628 10/08/22 0456 01/23/23 2121   BILITOT 0.9 1.4* 1.2  --   --   --  0.8  AST 20 24 24   --   --   --  32  ALT 8 14 19   --   --   --  17  ALKPHOS 76 49 78  --   --   --  61  PROT 4.4* 7.4 6.6  --   --   --  6.4*  ALBUMIN 2.5* 4.3 3.4* 2.9* 2.8* 2.7* 3.5    TUMOR MARKERS: No results for input(s): "AFPTM", "CEA", "CA199", "CHROMGRNA" in the last 8760 hours.  Assessment and Plan:  Calirae Tienda is a 89 yo female being seen today in relation to T2, T3, and T5 compression fractures. Request for inpatient kyphoplasty received. Case reviewed and approved by Dr Corliss Skains for inpatient kyphoplasty or vertebroplasty under general anesthesia. Procedure currently booked at 10 AM on Monday 02/11/23.  Patient will need to have Plavix held for minimum of 5 days prior to procedure. Dr Blake Divine has been notified of this and is coordinating with cardiology team to ensure that this is safe to stop. Eliquis to be held after Friday 9/27. Patient made NPO for Monday 9/30 at 0000.  Patient and family meeting planned for Thursday at 1130 to obtain consent as patient was wanting daughter to be present for consent signing.  Thank you for this interesting consult.  I greatly enjoyed meeting EMALIE RENDALL and look forward to participating in their care.  A copy of this report was sent to the requesting provider on this date.  Electronically Signed: Kennieth Francois, PA-C 02/05/2023, 4:06 PM   I spent a total of 40 Minutes in face to face in clinical consultation, greater than 50% of which was counseling/coordinating care for T2, T3, T5 compression fractures.

## 2023-02-05 NOTE — Plan of Care (Signed)
Problem: Education: Goal: Understanding of cardiac disease, CV risk reduction, and recovery process will improve Outcome: Progressing Goal: Individualized Educational Video(s) Outcome: Progressing

## 2023-02-05 NOTE — Progress Notes (Addendum)
[facility]                                                                               [name], is a 89 y.o. female, DOB - Jan 05, 1930, [id number] Admit date - 02/03/2023    Outpatient Primary MD for the patient is Rehab, [facility] - 0  days    Brief summary    89 y.o. female with medical history significant of CAD, diastolic CHF, hx of CVA, GERD, polycythemia,  hx of breast cancer s/p left lumpectomy, PAF on eliquis, depression and anxiety, frequent falls, hx of DVT and PE, chronic respiratory failure who presented to ED after a fall. She states she was walking from kitchen to the front door and guessed she lost her balance and fell onto her left side. She was admitted to [facility] for pain control and evaluation by IR to see if she is a candidate for kyphoplasty.   Assessment & Plan    Assessment and Plan: * Compression fracture of T2 vertebra (HCC) CT showing acute T2 compression fracture with approximately 40% vertebral height loss and 3mm retropulsion of the posterior superior cortex.  -neurosurgery consulted. Recommended follow up outpatient. No acute surgical intervention per ED provider - patient has a h/o  of multiple falls with recent admit for SDH with T3 and C7 compression fractures. T3 kyphoplasty that was attempted and aborted on 5/23 as patient was not able to tolerate c-collar removal, lay prone, maintain adequate saturation in addition to difficulty visualizing target for the procedure.  -pain control with scheduled tylenol 1000mg  TID x 3 days -oxycodone 5mg  q8 hour prn moderate to severe pain -lidocaine patch - IR consulted to see if she is a candidate for kyphoplasty. MRI of the thoracic spine ordered for further evaluation. Awaiting IR recommendations.  - PT/OT Consulted and recommended SNF.   Hypokalemia Replaced.   Leukocytosis Resolved with IV fluids. UA is negative.  CXR does not show any infection.   Chronic  respiratory failure with hypoxia (HCC) On 2-3L home oxygen PRN  Was discharged from hospital in 09/2022 on oxygen and was weaned. Uses PRN  Per daughter was just treated for pneumonia in SNF recently based off xray, but has no cough, shortness of breath or fever.  Coronary artery disease involving native coronary artery of native heart with unstable angina pectoris (HCC) S/p STEMI in 02/2022 LHC  notable for 95% calcified ostial RCA lesion. Had staged atherectomy and shockwave lithotripsy with DES x1 placed.  She was initially placed on DAPT with aspirin/Brilinta but given her episode of atrial fibrillation she was placed on plavix -- Continue plavix, Crestor 20 mg daily (no longer on metoprolol, unsure when discontinued)  -has not seen cardiology since this time. Discussed with daughter and importance of making an appointment.  - currently denies any chest pain.   Chronic diastolic CHF (congestive heart failure) (HCC) Appears Euvolemic.  Echo 09/2022: Unchanged hypokinesis of the inferior/inferolateral/inferoseptal base-mid wall EF 50-55%. There is severe asymmetric left ventricular hypertrophy of the basal-septal segment. Grade 1 DD.  Holding lasix.   Polycythemia Follows with oncology  Phlebotomy to maintain hematocrit below 45% Hemoglobin is  around 12.    Paroxysmal atrial fibrillation (HCC) In NSR Not on lopressor or amiodarone.  On Eliquis for anti coagulation.   Essential hypertension Suboptimally controlled. Suspect from back pain.  Will start her on hydralazine prn.   GERD (gastroesophageal reflux disease) Continue daily PPI   Depression with anxiety Continue with zoloft nightly and ativan BID   Mild hyponatremia:  Sodium of 133. Recheck labs in am.   Hyperlipidemia:  Resume crestor 20 mg daily.   Estimated body mass index is 17.35 kg/m as calculated from the following:   Height as of 01/23/23: 5\' 5"  (1.651 m).   Weight as of this encounter: 47.3 kg.  Code  Status: DNR limited.  DVT Prophylaxis:  apixaban (ELIQUIS) tablet 2.5 mg Start: 02/03/23 2200 apixaban (ELIQUIS) tablet 2.5 mg   Level of Care: Level of care: Telemetry Medical Family Communication: none at bedside.   Disposition Plan:     Remains inpatient appropriate:  pending pain control and therapy evaluations.   Procedures:  None.   Consultants:   IR   Antimicrobials:   Anti-infectives (From admission, onward)    None        Medications  Scheduled Meds:  acetaminophen  1,000 mg Oral Q8H   apixaban  2.5 mg Oral BID   clopidogrel  75 mg Oral Daily   docusate sodium  100 mg Oral BID   lidocaine  1 patch Transdermal Q24H   LORazepam  0.5 mg Oral BID   pantoprazole  40 mg Oral BID   rosuvastatin  20 mg Oral QHS   sertraline  25 mg Oral QHS   Continuous Infusions: PRN Meds:.morphine injection, ondansetron **OR** ondansetron (ZOFRAN) IV, oxyCODONE    Subjective:   Jovonna Olds was seen and examined today.  Pain sub optimal.  She wants to continue with eliquis for now, though she had multiple falls  Objective:   Vitals:   02/04/23 2006 02/05/23 0418 02/05/23 0500 02/05/23 0805  BP: (!) 159/74 (!) 145/84  (!) 177/96  Pulse: 93 78  76  Resp:      Temp: 98.2 F (36.8 C) 97.8 F (36.6 C)  97.6 F (36.4 C)  TempSrc: Oral Oral  Oral  SpO2: 94% 94%    Weight:   47.3 kg     Intake/Output Summary (Last 24 hours) at 02/05/2023 1321 Last data filed at 02/05/2023 1031 Gross per 24 hour  Intake --  Output 1100 ml  Net -1100 ml   Filed Weights   02/05/23 0500  Weight: 47.3 kg     Exam General exam: ill appearing lady, uncomfortable,  Respiratory system: Clear to auscultation. Respiratory effort normal. Cardiovascular system: S1 & S2 heard, RRR.  Gastrointestinal system: Abdomen is nondistended, soft and nontender.  Central nervous system: Alert and oriented. No focal neurological deficits. Extremities: Symmetric 5 x 5 power. Skin: No  rashes,   Data Reviewed:  I have personally reviewed following labs and imaging studies   CBC Lab Results  Component Value Date   WBC 7.7 02/04/2023   RBC 5.00 02/04/2023   HGB 12.8 02/04/2023   HCT 42.3 02/04/2023   MCV 84.6 02/04/2023   MCH 25.6 (L) 02/04/2023   PLT 464 (H) 02/04/2023   MCHC 30.3 02/04/2023   RDW 15.3 02/04/2023   LYMPHSABS 0.7 02/03/2023   MONOABS 1.0 02/03/2023   EOSABS 0.1 02/03/2023   BASOSABS 0.1 02/03/2023     Last metabolic panel Lab Results  Component Value Date   NA 133 (L)  02/04/2023   K 3.8 02/04/2023   CL 96 (L) 02/04/2023   CO2 27 02/04/2023   BUN 8 02/04/2023   CREATININE 0.82 02/04/2023   GLUCOSE 100 (H) 02/04/2023   GFRNONAA >60 02/04/2023   GFRAA NOT CALCULATED 12/27/2019   CALCIUM 8.7 (L) 02/04/2023   PHOS 3.3 10/08/2022   PROT 6.4 (L) 01/23/2023   ALBUMIN 3.5 01/23/2023   LABGLOB 2.7 10/04/2016   AGRATIO 1.6 10/04/2016   BILITOT 0.8 01/23/2023   ALKPHOS 61 01/23/2023   AST 32 01/23/2023   ALT 17 01/23/2023   ANIONGAP 10 02/04/2023    CBG (last 3)  No results for input(s): "GLUCAP" in the last 72 hours.    Coagulation Profile: Recent Labs  Lab 02/03/23 0255  INR 1.1     Radiology Studies: MR THORACIC SPINE WO CONTRAST  Result Date: 02/05/2023 CLINICAL DATA:  Initial evaluation for compression fracture. EXAM: MRI THORACIC SPINE WITHOUT CONTRAST TECHNIQUE: Multiplanar, multisequence MR imaging of the thoracic spine was performed. No intravenous contrast was administered. COMPARISON:  Prior CT from 01/23/2023. FINDINGS: Alignment:  Examination degraded by motion artifact. Exaggeration of the normal thoracic kyphosis. No significant listhesis. Vertebrae: Chronic compression fractures involving the T4, T7, T8, T9, T10, T12, and L1 vertebral bodies with sequelae of prior vertebral augmentation. Mild bony retropulsion about several of these fractures without significant stenosis. Compression fracture involving the T2  vertebral body with mild 30% height loss and trace 2 mm retropulsion. Persistent mild marrow edema, consistent with a probable subacute compression fracture. No significant stenosis. Compression fracture of the T3 vertebral body with advanced 80% height loss and 3 mm bony retropulsion. Persistent marrow edema, consistent with a subacute compression fracture. No significant stenosis. Fracture deformity of the T5 vertebral body without associated height loss or retropulsion, likely subacute in nature. Fracture deformity of the T7 vertebral body with advanced 70-80% height loss and trace 2 mm bony retropulsion. Persistent mild marrow edema, consistent with a probable subacute compression fracture. No significant stenosis. Mild compression deformity of the C7 vertebral body with mild marrow edema, likely a subacute compression fracture. No more than mild height loss without bony retropulsion or stenosis. Underlying bone marrow signal intensity within normal limits. No worrisome osseous lesions. Cord:  Normal signal and morphology. Paraspinal and other soft tissues: Paraspinous soft tissues demonstrate no acute finding. Small layering bilateral pleural effusions, left greater than right. Approximate 1 cm T2 hyperintense left renal cyst, benign in appearance, no follow-up imaging recommended. Disc levels: Underlying ordinary for age multilevel disc desiccation with mild disc bulging throughout the thoracic spine. Mild multilevel facet hypertrophy. No significant spinal stenosis within the thoracic spine. Moderate to advance multilevel foraminal narrowing, largely due to compression deformities and facet degeneration. IMPRESSION: 1. Subacute compression fractures involving the C7, T2, T3, T5, and T7 vertebral bodies as above. No significant associated spinal stenosis. 2. Chronic compression fractures involving the T4, T8, T9, T10, T12, and L1 vertebral bodies with sequelae of prior vertebral augmentation. 3. Underlying  mild for age spondylosis without significant spinal stenosis. 4. Small layering bilateral pleural effusions, left greater than right. Electronically Signed   By: Rise Mu M.D.   On: 02/05/2023 05:23       Kathlen Mody M.D. [facility] 02/05/2023, 1:21 PM  Available via Epic secure chat 7am-7pm After 7 pm, please refer to night coverage provider listed on amion.   Addendum at 4 :35 pm  Notified by IR that we will need to hold plavix for 5 days  prior to the kyphoplasty.  Will request cardiology's input to see if we can hold plavix and also give recommendations on continuing the Eliquis in view of her recurrent falls and multiple fractures.   Kathlen Mody MD.

## 2023-02-05 NOTE — Plan of Care (Signed)
Problem: Health Behavior/Discharge Planning: Goal: Ability to safely manage health-related needs after discharge will improve Outcome: Progressing

## 2023-02-05 NOTE — Plan of Care (Signed)
  Problem: Cardiac: Goal: Ability to achieve and maintain adequate cardiovascular perfusion will improve Outcome: Progressing   Problem: Pain Management: Goal: Pain level will decrease Outcome: Progressing

## 2023-02-06 DIAGNOSIS — J9811 Atelectasis: Secondary | ICD-10-CM | POA: Diagnosis present

## 2023-02-06 DIAGNOSIS — I25110 Atherosclerotic heart disease of native coronary artery with unstable angina pectoris: Secondary | ICD-10-CM | POA: Diagnosis not present

## 2023-02-06 DIAGNOSIS — I480 Paroxysmal atrial fibrillation: Secondary | ICD-10-CM | POA: Diagnosis present

## 2023-02-06 DIAGNOSIS — I5042 Chronic combined systolic (congestive) and diastolic (congestive) heart failure: Secondary | ICD-10-CM | POA: Diagnosis present

## 2023-02-06 DIAGNOSIS — E871 Hypo-osmolality and hyponatremia: Secondary | ICD-10-CM | POA: Diagnosis present

## 2023-02-06 DIAGNOSIS — S52611A Displaced fracture of right ulna styloid process, initial encounter for closed fracture: Secondary | ICD-10-CM | POA: Diagnosis not present

## 2023-02-06 DIAGNOSIS — S22020A Wedge compression fracture of second thoracic vertebra, initial encounter for closed fracture: Secondary | ICD-10-CM | POA: Diagnosis not present

## 2023-02-06 DIAGNOSIS — W19XXXA Unspecified fall, initial encounter: Secondary | ICD-10-CM | POA: Diagnosis not present

## 2023-02-06 DIAGNOSIS — F418 Other specified anxiety disorders: Secondary | ICD-10-CM | POA: Diagnosis present

## 2023-02-06 DIAGNOSIS — J9611 Chronic respiratory failure with hypoxia: Secondary | ICD-10-CM | POA: Diagnosis present

## 2023-02-06 DIAGNOSIS — F32A Depression, unspecified: Secondary | ICD-10-CM | POA: Diagnosis present

## 2023-02-06 DIAGNOSIS — D72829 Elevated white blood cell count, unspecified: Secondary | ICD-10-CM | POA: Diagnosis present

## 2023-02-06 DIAGNOSIS — I252 Old myocardial infarction: Secondary | ICD-10-CM | POA: Diagnosis not present

## 2023-02-06 DIAGNOSIS — R6889 Other general symptoms and signs: Secondary | ICD-10-CM | POA: Diagnosis not present

## 2023-02-06 DIAGNOSIS — S22029A Unspecified fracture of second thoracic vertebra, initial encounter for closed fracture: Secondary | ICD-10-CM | POA: Diagnosis present

## 2023-02-06 DIAGNOSIS — Y92002 Bathroom of unspecified non-institutional (private) residence single-family (private) house as the place of occurrence of the external cause: Secondary | ICD-10-CM | POA: Diagnosis not present

## 2023-02-06 DIAGNOSIS — S52501A Unspecified fracture of the lower end of right radius, initial encounter for closed fracture: Secondary | ICD-10-CM | POA: Diagnosis not present

## 2023-02-06 DIAGNOSIS — M4854XA Collapsed vertebra, not elsewhere classified, thoracic region, initial encounter for fracture: Secondary | ICD-10-CM | POA: Diagnosis not present

## 2023-02-06 DIAGNOSIS — S22030A Wedge compression fracture of third thoracic vertebra, initial encounter for closed fracture: Secondary | ICD-10-CM | POA: Diagnosis not present

## 2023-02-06 DIAGNOSIS — M549 Dorsalgia, unspecified: Secondary | ICD-10-CM | POA: Diagnosis not present

## 2023-02-06 DIAGNOSIS — E785 Hyperlipidemia, unspecified: Secondary | ICD-10-CM | POA: Diagnosis present

## 2023-02-06 DIAGNOSIS — D751 Secondary polycythemia: Secondary | ICD-10-CM | POA: Diagnosis present

## 2023-02-06 DIAGNOSIS — Z743 Need for continuous supervision: Secondary | ICD-10-CM | POA: Diagnosis not present

## 2023-02-06 DIAGNOSIS — G4733 Obstructive sleep apnea (adult) (pediatric): Secondary | ICD-10-CM | POA: Diagnosis present

## 2023-02-06 DIAGNOSIS — D500 Iron deficiency anemia secondary to blood loss (chronic): Secondary | ICD-10-CM | POA: Diagnosis present

## 2023-02-06 DIAGNOSIS — Y842 Radiological procedure and radiotherapy as the cause of abnormal reaction of the patient, or of later complication, without mention of misadventure at the time of the procedure: Secondary | ICD-10-CM | POA: Diagnosis present

## 2023-02-06 DIAGNOSIS — W01198A Fall on same level from slipping, tripping and stumbling with subsequent striking against other object, initial encounter: Secondary | ICD-10-CM | POA: Diagnosis present

## 2023-02-06 DIAGNOSIS — S22020D Wedge compression fracture of second thoracic vertebra, subsequent encounter for fracture with routine healing: Secondary | ICD-10-CM | POA: Diagnosis not present

## 2023-02-06 DIAGNOSIS — I110 Hypertensive heart disease with heart failure: Secondary | ICD-10-CM | POA: Diagnosis present

## 2023-02-06 DIAGNOSIS — Z66 Do not resuscitate: Secondary | ICD-10-CM | POA: Diagnosis present

## 2023-02-06 DIAGNOSIS — R64 Cachexia: Secondary | ICD-10-CM | POA: Diagnosis present

## 2023-02-06 DIAGNOSIS — Y9301 Activity, walking, marching and hiking: Secondary | ICD-10-CM | POA: Diagnosis present

## 2023-02-06 DIAGNOSIS — S61511A Laceration without foreign body of right wrist, initial encounter: Secondary | ICD-10-CM | POA: Diagnosis not present

## 2023-02-06 DIAGNOSIS — I509 Heart failure, unspecified: Secondary | ICD-10-CM | POA: Diagnosis not present

## 2023-02-06 DIAGNOSIS — S6991XA Unspecified injury of right wrist, hand and finger(s), initial encounter: Secondary | ICD-10-CM | POA: Diagnosis present

## 2023-02-06 DIAGNOSIS — M1811 Unilateral primary osteoarthritis of first carpometacarpal joint, right hand: Secondary | ICD-10-CM | POA: Diagnosis not present

## 2023-02-06 DIAGNOSIS — S52571A Other intraarticular fracture of lower end of right radius, initial encounter for closed fracture: Secondary | ICD-10-CM | POA: Diagnosis not present

## 2023-02-06 DIAGNOSIS — F17210 Nicotine dependence, cigarettes, uncomplicated: Secondary | ICD-10-CM | POA: Diagnosis present

## 2023-02-06 DIAGNOSIS — Z79899 Other long term (current) drug therapy: Secondary | ICD-10-CM | POA: Diagnosis not present

## 2023-02-06 DIAGNOSIS — E43 Unspecified severe protein-calorie malnutrition: Secondary | ICD-10-CM | POA: Diagnosis present

## 2023-02-06 DIAGNOSIS — I2510 Atherosclerotic heart disease of native coronary artery without angina pectoris: Secondary | ICD-10-CM | POA: Diagnosis not present

## 2023-02-06 DIAGNOSIS — Z681 Body mass index (BMI) 19 or less, adult: Secondary | ICD-10-CM | POA: Diagnosis not present

## 2023-02-06 DIAGNOSIS — R58 Hemorrhage, not elsewhere classified: Secondary | ICD-10-CM | POA: Diagnosis not present

## 2023-02-06 DIAGNOSIS — R54 Age-related physical debility: Secondary | ICD-10-CM | POA: Diagnosis present

## 2023-02-06 DIAGNOSIS — M79641 Pain in right hand: Secondary | ICD-10-CM | POA: Diagnosis not present

## 2023-02-06 DIAGNOSIS — Z7901 Long term (current) use of anticoagulants: Secondary | ICD-10-CM | POA: Diagnosis not present

## 2023-02-06 DIAGNOSIS — E876 Hypokalemia: Secondary | ICD-10-CM | POA: Diagnosis present

## 2023-02-06 LAB — CBC WITH DIFFERENTIAL/PLATELET
Abs Immature Granulocytes: 0.03 10*3/uL (ref 0.00–0.07)
Basophils Absolute: 0.1 10*3/uL (ref 0.0–0.1)
Basophils Relative: 1 %
Eosinophils Absolute: 0.1 10*3/uL (ref 0.0–0.5)
Eosinophils Relative: 1 %
HCT: 44 % (ref 36.0–46.0)
Hemoglobin: 13.6 g/dL (ref 12.0–15.0)
Immature Granulocytes: 0 %
Lymphocytes Relative: 8 %
Lymphs Abs: 0.6 10*3/uL — ABNORMAL LOW (ref 0.7–4.0)
MCH: 26.1 pg (ref 26.0–34.0)
MCHC: 30.9 g/dL (ref 30.0–36.0)
MCV: 84.5 fL (ref 80.0–100.0)
Monocytes Absolute: 0.8 10*3/uL (ref 0.1–1.0)
Monocytes Relative: 10 %
Neutro Abs: 6.5 10*3/uL (ref 1.7–7.7)
Neutrophils Relative %: 80 %
Platelets: 444 10*3/uL — ABNORMAL HIGH (ref 150–400)
RBC: 5.21 MIL/uL — ABNORMAL HIGH (ref 3.87–5.11)
RDW: 15.2 % (ref 11.5–15.5)
WBC: 8.1 10*3/uL (ref 4.0–10.5)
nRBC: 0 % (ref 0.0–0.2)

## 2023-02-06 LAB — BASIC METABOLIC PANEL
Anion gap: 8 (ref 5–15)
BUN: 8 mg/dL (ref 8–23)
CO2: 29 mmol/L (ref 22–32)
Calcium: 8.5 mg/dL — ABNORMAL LOW (ref 8.9–10.3)
Chloride: 94 mmol/L — ABNORMAL LOW (ref 98–111)
Creatinine, Ser: 0.55 mg/dL (ref 0.44–1.00)
GFR, Estimated: >60 mL/min (ref >60)
Glucose, Bld: 125 mg/dL — ABNORMAL HIGH (ref 70–99)
Potassium: 3.7 mmol/L (ref 3.5–5.1)
Sodium: 131 mmol/L — ABNORMAL LOW (ref 135–145)

## 2023-02-06 NOTE — Progress Notes (Signed)
PROGRESS NOTE    [name]  ZHY:[id number] DOB: 12-10-1929 DOA: 02/03/2023 PCP: [facility]    Brief Narrative:  This 89 y.o. female with medical history significant of CAD, diastolic CHF, hx of CVA, GERD, polycythemia,  hx of breast cancer s/p left lumpectomy, PAF on eliquis, depression and anxiety, frequent falls, hx of DVT and PE, chronic respiratory failure who presented to ED after a fall. She states she was walking from kitchen to the front door and guessed she lost her balance and fell onto her left side. She was admitted to [facility] for pain control and evaluation by IR to see if she is a candidate for kyphoplasty.  She is scheduled for kyphoplasty this week.  Assessment & Plan:   Principal Problem:   Compression fracture of T2 vertebra (HCC) Active Problems:   Hypokalemia   Leukocytosis   Chronic respiratory failure with hypoxia (HCC)   Coronary artery disease involving native coronary artery of native heart with unstable angina pectoris (HCC)   Chronic diastolic CHF (congestive heart failure) (HCC)   Polycythemia   Paroxysmal atrial fibrillation (HCC)   Essential hypertension   GERD (gastroesophageal reflux disease)   Depression with anxiety  Compression fracture of T2 vertebra [facility]): CT showing acute T2 compression fracture with approximately 40% vertebral height loss and 3mm retropulsion of the posterior superior cortex.  Neurosurgery consulted. Recommended follow up outpatient. No acute surgical intervention per ED provider Patient has  h/o of multiple falls with recent admit for SDH with T3 and C7 compression fractures. T3 kyphoplasty that was attempted and aborted on 5/23 as patient was not able to tolerate c-collar removal, lay prone, maintain adequate saturation in addition to difficulty visualizing target for the procedure.  -Continue pain control with scheduled tylenol 1000mg  TID x 3 days -Continue oxycodone 5mg  q8 hour prn moderate to severe  pain -lidocaine patch - IR consulted to see if she is a candidate for kyphoplasty.  IR tentatively scheduled patient for kyphoplasty on 9/30.  Eliquis is to be held for minimum 5 days.   -Cardiology recommended discontinue Eliquis indefinitely and recommended Plavix monotherapy can be stopped at this time and resume post kyphoplasty.- PT/OT Consulted and recommended SNF.    Hypokalemia Replaced. Resolved.   Leukocytosis Resolved with IV fluids. UA is negative.  CXR does not show any infection.    Chronic respiratory failure with hypoxia (HCC) On 2-3L home oxygen PRN  She was discharged from hospital in 09/2022 on oxygen and was weaned. Uses PRN  Per daughter was just treated for pneumonia in SNF recently based off xray, but has no cough, shortness of breath or fever.   Coronary artery disease involving native coronary artery of native heart with unstable angina pectoris (HCC) S/p STEMI in 02/2022 LHC  notable for 95% calcified ostial RCA lesion. Had staged atherectomy and shockwave lithotripsy with DES x1 placed.  She was initially placed on DAPT with aspirin/Brilinta but given her episode of atrial fibrillation she was placed on plavix. -- Continue plavix, Crestor 20 mg daily (no longer on metoprolol, unsure when discontinued)  -Patient denies any chest pain.  Cardiologist recommended Plavix monotherapy.   Chronic diastolic CHF (congestive heart failure) (HCC) Appears Euvolemic.  Echo 09/2022: EF 50-55%. Grade 1 DD.  Holding lasix.    Polycythemia: Follows with oncology  Phlebotomy to maintain hematocrit below 45% Hemoglobin is around 12.      Paroxysmal atrial fibrillation (HCC) In NSR Not on lopressor or amiodarone.  Cardiology  discontinued Eliquis indefinitely.   Essential hypertension Suboptimally controlled. Suspect from back pain.  Continue hydralazine as needed.   GERD (gastroesophageal reflux disease) Continue daily PPI.   Depression with anxiety Continue with  zoloft nightly and ativan BID    Mild hyponatremia:  Sodium of 133. Recheck labs in am.    Hyperlipidemia:  Resume crestor 20 mg daily.    Estimated body mass index is 17.35 kg/m as calculated from the following:   Height as of 01/23/23: 5\' 5"  (1.651 m).   Weight as of this encounter: 47.3 kg.    DVT prophylaxis: Eliquis Code Status: DNR Family Communication:No family at bed side. Disposition Plan:  Status is: Observation The patient remains OBS appropriate and will d/c before 2 midnights.   Admitted for pain control and kyphoplasty.   Consultants:  IR  Procedures: None  Antimicrobials:  Anti-infectives (From admission, onward)    None      Subjective: Patient was seen and examined at bedside.  Overnight events noted.   Patient is scheduled for kyphoplasty next week.  She reports having pain.  Objective: Vitals:   02/05/23 0805 02/05/23 1957 02/06/23 0500 02/06/23 0859  BP: (!) 177/96 (!) 184/106  (!) 174/81  Pulse: 76 91  83  Resp:  19  18  Temp: 97.6 F (36.4 C) 98.6 F (37 C)  97.6 F (36.4 C)  TempSrc: Oral Oral  Oral  SpO2:  96%  94%  Weight:   48.5 kg     Intake/Output Summary (Last 24 hours) at 02/06/2023 1326 Last data filed at 02/05/2023 2000 Gross per 24 hour  Intake 220 ml  Output 300 ml  Net -80 ml   Filed Weights   02/05/23 0500 02/06/23 0500  Weight: 47.3 kg 48.5 kg    Examination:  General exam: Appears calm and comfortable, deconditioned, not in any acute distress. Respiratory system: CTA bilaterally. Respiratory effort normal. RR 13 Cardiovascular system: S1 & S2 heard, RRR. No Murmer, No pedal edema. Gastrointestinal system: Abdomen is non distended, soft and non tender. Normal bowel sounds heard. Central nervous system: Alert and oriented x 3. No focal neurological deficits. Extremities: No edema, No cyanosis, No clubbing Skin: No rashes, lesions or ulcers Psychiatry: Judgement and insight appear normal. Mood & affect  appropriate.     Data Reviewed: I have personally reviewed following labs and imaging studies  CBC: Recent Labs  Lab 02/03/23 0255 02/04/23 0350 02/06/23 0254  WBC 12.3* 7.7 8.1  NEUTROABS 10.4*  --  6.5  HGB 15.4* 12.8 13.6  HCT 49.3* 42.3 44.0  MCV 85.0 84.6 84.5  PLT 508* 464* 444*   Basic Metabolic Panel: Recent Labs  Lab 02/03/23 0255 02/04/23 0350 02/06/23 0254  NA 132* 133* 131*  K 3.4* 3.8 3.7  CL 91* 96* 94*  CO2 29 27 29   GLUCOSE 120* 100* 125*  BUN <5* 8 8  CREATININE 0.64 0.82 0.55  CALCIUM 9.1 8.7* 8.5*  MG 2.0  --   --    GFR: Estimated Creatinine Clearance: 35.1 mL/min (by C-G formula based on SCr of 0.55 mg/dL). Liver Function Tests: No results for input(s): "AST", "ALT", "ALKPHOS", "BILITOT", "PROT", "ALBUMIN" in the last 168 hours. No results for input(s): "LIPASE", "AMYLASE" in the last 168 hours. No results for input(s): "AMMONIA" in the last 168 hours. Coagulation Profile: Recent Labs  Lab 02/03/23 0255  INR 1.1   Cardiac Enzymes: No results for input(s): "CKTOTAL", "CKMB", "CKMBINDEX", "TROPONINI" in the last 168 hours. BNP (  last 3 results) No results for input(s): "PROBNP" in the last 8760 hours. HbA1C: No results for input(s): "HGBA1C" in the last 72 hours. CBG: No results for input(s): "GLUCAP" in the last 168 hours. Lipid Profile: No results for input(s): "CHOL", "HDL", "LDLCALC", "TRIG", "CHOLHDL", "LDLDIRECT" in the last 72 hours. Thyroid Function Tests: No results for input(s): "TSH", "T4TOTAL", "FREET4", "T3FREE", "THYROIDAB" in the last 72 hours. Anemia Panel: No results for input(s): "VITAMINB12", "FOLATE", "FERRITIN", "TIBC", "IRON", "RETICCTPCT" in the last 72 hours. Sepsis Labs: No results for input(s): "PROCALCITON", "LATICACIDVEN" in the last 168 hours.  No results found for this or any previous visit (from the past 240 hour(s)).   Radiology Studies: MR THORACIC SPINE WO CONTRAST  Result Date:  02/05/2023 CLINICAL DATA:  Initial evaluation for compression fracture. EXAM: MRI THORACIC SPINE WITHOUT CONTRAST TECHNIQUE: Multiplanar, multisequence MR imaging of the thoracic spine was performed. No intravenous contrast was administered. COMPARISON:  Prior CT from 01/23/2023. FINDINGS: Alignment:  Examination degraded by motion artifact. Exaggeration of the normal thoracic kyphosis. No significant listhesis. Vertebrae: Chronic compression fractures involving the T4, T7, T8, T9, T10, T12, and L1 vertebral bodies with sequelae of prior vertebral augmentation. Mild bony retropulsion about several of these fractures without significant stenosis. Compression fracture involving the T2 vertebral body with mild 30% height loss and trace 2 mm retropulsion. Persistent mild marrow edema, consistent with a probable subacute compression fracture. No significant stenosis. Compression fracture of the T3 vertebral body with advanced 80% height loss and 3 mm bony retropulsion. Persistent marrow edema, consistent with a subacute compression fracture. No significant stenosis. Fracture deformity of the T5 vertebral body without associated height loss or retropulsion, likely subacute in nature. Fracture deformity of the T7 vertebral body with advanced 70-80% height loss and trace 2 mm bony retropulsion. Persistent mild marrow edema, consistent with a probable subacute compression fracture. No significant stenosis. Mild compression deformity of the C7 vertebral body with mild marrow edema, likely a subacute compression fracture. No more than mild height loss without bony retropulsion or stenosis. Underlying bone marrow signal intensity within normal limits. No worrisome osseous lesions. Cord:  Normal signal and morphology. Paraspinal and other soft tissues: Paraspinous soft tissues demonstrate no acute finding. Small layering bilateral pleural effusions, left greater than right. Approximate 1 cm T2 hyperintense left renal cyst,  benign in appearance, no follow-up imaging recommended. Disc levels: Underlying ordinary for age multilevel disc desiccation with mild disc bulging throughout the thoracic spine. Mild multilevel facet hypertrophy. No significant spinal stenosis within the thoracic spine. Moderate to advance multilevel foraminal narrowing, largely due to compression deformities and facet degeneration. IMPRESSION: 1. Subacute compression fractures involving the C7, T2, T3, T5, and T7 vertebral bodies as above. No significant associated spinal stenosis. 2. Chronic compression fractures involving the T4, T8, T9, T10, T12, and L1 vertebral bodies with sequelae of prior vertebral augmentation. 3. Underlying mild for age spondylosis without significant spinal stenosis. 4. Small layering bilateral pleural effusions, left greater than right. Electronically Signed   By: Rise Mu M.D.   On: 02/05/2023 05:23    Scheduled Meds:  docusate sodium  100 mg Oral BID   lidocaine  1 patch Transdermal Q24H   LORazepam  0.5 mg Oral BID   pantoprazole  40 mg Oral BID   rosuvastatin  20 mg Oral QHS   sertraline  25 mg Oral QHS   Continuous Infusions:   LOS: 0 days    Time spent: 50 mins    Jesusita Jocelyn Khatri,  MD [facility]   If 7PM-7AM, please contact night-coverage

## 2023-02-07 DIAGNOSIS — S22020D Wedge compression fracture of second thoracic vertebra, subsequent encounter for fracture with routine healing: Secondary | ICD-10-CM | POA: Diagnosis not present

## 2023-02-07 LAB — CBC
HCT: 44.4 % (ref 36.0–46.0)
Hemoglobin: 14.1 g/dL (ref 12.0–15.0)
MCH: 26.4 pg (ref 26.0–34.0)
MCHC: 31.8 g/dL (ref 30.0–36.0)
MCV: 83.1 fL (ref 80.0–100.0)
Platelets: 475 10*3/uL — ABNORMAL HIGH (ref 150–400)
RBC: 5.34 MIL/uL — ABNORMAL HIGH (ref 3.87–5.11)
RDW: 15.2 % (ref 11.5–15.5)
WBC: 11.3 10*3/uL — ABNORMAL HIGH (ref 4.0–10.5)
nRBC: 0 % (ref 0.0–0.2)

## 2023-02-07 LAB — BASIC METABOLIC PANEL
Anion gap: 8 (ref 5–15)
BUN: 11 mg/dL (ref 8–23)
CO2: 27 mmol/L (ref 22–32)
Calcium: 8.6 mg/dL — ABNORMAL LOW (ref 8.9–10.3)
Chloride: 94 mmol/L — ABNORMAL LOW (ref 98–111)
Creatinine, Ser: 0.63 mg/dL (ref 0.44–1.00)
GFR, Estimated: >60 mL/min (ref >60)
Glucose, Bld: 104 mg/dL — ABNORMAL HIGH (ref 70–99)
Potassium: 4.1 mmol/L (ref 3.5–5.1)
Sodium: 129 mmol/L — ABNORMAL LOW (ref 135–145)

## 2023-02-07 LAB — MAGNESIUM: Magnesium: 2.1 mg/dL (ref 1.7–2.4)

## 2023-02-07 LAB — PHOSPHORUS: Phosphorus: 3 mg/dL (ref 2.5–4.6)

## 2023-02-07 NOTE — Progress Notes (Addendum)
PROGRESS NOTE    [name]  OZD:[id number] DOB: 1930/03/03 DOA: 02/03/2023 PCP: [facility]    Brief Narrative:  This 89 y.o. female with medical history significant of CAD, diastolic CHF, hx of CVA, GERD, polycythemia,  hx of breast cancer s/p left lumpectomy, PAF on eliquis, depression and anxiety, frequent falls, hx of DVT and PE, chronic respiratory failure who presented to ED after a fall. She states she was walking from kitchen to the front door and guessed she lost her balance and fell onto her left side. She was admitted to [facility] for pain control and evaluation by IR to see if she is a candidate for kyphoplasty.  She is scheduled for kyphoplasty this week.  Assessment & Plan:   Principal Problem:   Compression fracture of T2 vertebra (HCC) Active Problems:   Hypokalemia   Leukocytosis   Chronic respiratory failure with hypoxia (HCC)   Coronary artery disease involving native coronary artery of native heart with unstable angina pectoris (HCC)   Chronic diastolic CHF (congestive heart failure) (HCC)   Polycythemia   Paroxysmal atrial fibrillation (HCC)   Essential hypertension   GERD (gastroesophageal reflux disease)   Depression with anxiety  Compression fracture of T2 vertebra [facility]): CT showing acute T2 compression fracture with approximately 40% vertebral height loss and 3mm retropulsion of the posterior superior cortex.  Neurosurgery consulted. Recommended follow up outpatient. No acute surgical intervention per ED provider Patient has h/o of multiple falls with recent admit for SDH with T3 and C7 compression fractures. T3 kyphoplasty that was attempted and aborted on 5/23 as patient was not able to tolerate c-collar removal, lay prone, maintain adequate saturation in addition to difficulty visualizing target for the procedure.  -Continue pain control with scheduled tylenol 1000mg  TID x 3 days -Continue oxycodone 5mg  q8 hour prn moderate to severe  pain -lidocaine patch - IR consulted to see if she is a candidate for kyphoplasty.  IR tentatively scheduled patient for kyphoplasty on 9/30.  Eliquis is to be held for minimum 5 days.   -Cardiology recommended discontinue Eliquis indefinitely and recommended Plavix monotherapy , but can be stopped at this time and resume post kyphoplasty.- PT/OT Consulted and recommended SNF.    Hypokalemia: Replaced. Resolved.   Leukocytosis: UA is negative.  CXR does not show any infection.  Continue to monitor.   Chronic respiratory failure with hypoxia (HCC) On 2-3L home oxygen PRN  She was discharged from hospital in 09/2022 on oxygen and was weaned. Uses PRN  Per daughter was just treated for pneumonia in SNF,  recently based off xray, but has no cough, shortness of breath or fever.   Coronary artery disease involving native coronary artery of native heart with unstable angina pectoris (HCC) S/p STEMI in 02/2022 LHC  notable for 95% calcified ostial RCA lesion. Had staged atherectomy and shockwave lithotripsy with DES x1 placed.  She was initially placed on DAPT with aspirin/Brilinta but given her episode of atrial fibrillation she was placed on plavix. -- Continue plavix, Crestor 20 mg daily (no longer on metoprolol, unsure when discontinued)  -Patient denies any chest pain.  Cardiologist recommended Plavix monotherapy.   Chronic diastolic CHF (congestive heart failure) (HCC) Appears Euvolemic.  Echo 09/2022: EF 50-55%. Grade 1 DD.  Holding lasix.    Polycythemia: Follows with oncology  Phlebotomy to maintain hematocrit below 45% Hemoglobin is around 12.      Paroxysmal atrial fibrillation (HCC) In NSR Not on lopressor or amiodarone.  Cardiology discontinued Eliquis indefinitely.   Essential hypertension Suboptimally controlled. Suspect from back pain.  Continue hydralazine as needed.   GERD (gastroesophageal reflux disease) Continue daily PPI.   Depression with anxiety Continue  with zoloft nightly and ativan BID    Mild hyponatremia:  Sodium of 133 > 129 Recheck labs in am.    Hyperlipidemia:  Resume crestor 20 mg daily.    Estimated body mass index is 17.35 kg/m as calculated from the following:   Height as of 01/23/23: 5\' 5"  (1.651 m).   Weight as of this encounter: 47.3 kg.    DVT prophylaxis: SCDs Code Status: DNR Family Communication:No family at bed side. Disposition Plan:   Status is: Inpatient Remains inpatient appropriate because:      Admitted for pain control and kyphoplasty.   Consultants:  IR  Procedures: None  Antimicrobials:  Anti-infectives (From admission, onward)    None      Subjective: Patient was seen and examined at bedside.  Overnight events noted.   Patient is scheduled for kyphoplasty next week.  She reports having pain. She reports having pain in the left shoulder and back.  Objective: Vitals:   02/06/23 1921 02/07/23 0338 02/07/23 0430 02/07/23 0833  BP: (!) 150/87 (!) 177/98  (!) 165/79  Pulse: 98 89  87  Resp: 18 18  18   Temp: 98.5 F (36.9 C) 97.8 F (36.6 C)  97.9 F (36.6 C)  TempSrc: Oral Oral  Oral  SpO2: 93% 96%  97%  Weight:   48.3 kg     Intake/Output Summary (Last 24 hours) at 02/07/2023 1340 Last data filed at 02/06/2023 2015 Gross per 24 hour  Intake 220 ml  Output --  Net 220 ml   Filed Weights   02/05/23 0500 02/06/23 0500 02/07/23 0430  Weight: 47.3 kg 48.5 kg 48.3 kg    Examination:  General exam: Appears comfortable, calm, deconditioned, not in any acute distress. Respiratory system: CTA bilaterally. Respiratory effort normal. RR 14 Cardiovascular system: S1 & S2 heard, RRR. No Murmer, No pedal edema. Gastrointestinal system: Abdomen is non distended, soft and non tender. Normal bowel sounds heard. Central nervous system: Alert and oriented x 3. No focal neurological deficits. Extremities: No edema, No cyanosis, No clubbing Skin: No rashes, lesions or ulcers Psychiatry:  Judgement and insight appear normal. Mood & affect appropriate.     Data Reviewed: I have personally reviewed following labs and imaging studies  CBC: Recent Labs  Lab 02/03/23 0255 02/04/23 0350 02/06/23 0254 02/07/23 0352  WBC 12.3* 7.7 8.1 11.3*  NEUTROABS 10.4*  --  6.5  --   HGB 15.4* 12.8 13.6 14.1  HCT 49.3* 42.3 44.0 44.4  MCV 85.0 84.6 84.5 83.1  PLT 508* 464* 444* 475*   Basic Metabolic Panel: Recent Labs  Lab 02/03/23 0255 02/04/23 0350 02/06/23 0254 02/07/23 0352  NA 132* 133* 131* 129*  K 3.4* 3.8 3.7 4.1  CL 91* 96* 94* 94*  CO2 29 27 29 27   GLUCOSE 120* 100* 125* 104*  BUN <5* 8 8 11   CREATININE 0.64 0.82 0.55 0.63  CALCIUM 9.1 8.7* 8.5* 8.6*  MG 2.0  --   --  2.1  PHOS  --   --   --  3.0   GFR: Estimated Creatinine Clearance: 34.9 mL/min (by C-G formula based on SCr of 0.63 mg/dL). Liver Function Tests: No results for input(s): "AST", "ALT", "ALKPHOS", "BILITOT", "PROT", "ALBUMIN" in the last 168 hours. No results for input(s): "LIPASE", "  AMYLASE" in the last 168 hours. No results for input(s): "AMMONIA" in the last 168 hours. Coagulation Profile: Recent Labs  Lab 02/03/23 0255  INR 1.1   Cardiac Enzymes: No results for input(s): "CKTOTAL", "CKMB", "CKMBINDEX", "TROPONINI" in the last 168 hours. BNP (last 3 results) No results for input(s): "PROBNP" in the last 8760 hours. HbA1C: No results for input(s): "HGBA1C" in the last 72 hours. CBG: No results for input(s): "GLUCAP" in the last 168 hours. Lipid Profile: No results for input(s): "CHOL", "HDL", "LDLCALC", "TRIG", "CHOLHDL", "LDLDIRECT" in the last 72 hours. Thyroid Function Tests: No results for input(s): "TSH", "T4TOTAL", "FREET4", "T3FREE", "THYROIDAB" in the last 72 hours. Anemia Panel: No results for input(s): "VITAMINB12", "FOLATE", "FERRITIN", "TIBC", "IRON", "RETICCTPCT" in the last 72 hours. Sepsis Labs: No results for input(s): "PROCALCITON", "LATICACIDVEN" in the last 168  hours.  No results found for this or any previous visit (from the past 240 hour(s)).   Radiology Studies: No results found.  Scheduled Meds:  docusate sodium  100 mg Oral BID   lidocaine  1 patch Transdermal Q24H   LORazepam  0.5 mg Oral BID   pantoprazole  40 mg Oral BID   rosuvastatin  20 mg Oral QHS   sertraline  25 mg Oral QHS   Continuous Infusions:   LOS: 1 day    Time spent: 35 mins    Willeen Niece, MD [facility]   If 7PM-7AM, please contact night-coverage

## 2023-02-07 NOTE — Progress Notes (Signed)
PT Cancellation Note  Patient Details Name: BYRDIE VOEGELE MRN: [id number] DOB: Feb 14, 1930   Cancelled Treatment:    Reason Eval/Treat Not Completed: Fatigue/lethargy limiting ability to participate sleeping soundly, family in room and reports MD is coming to room to meet with them at 1130am anyway. Will attempt to return later if time/schedule allow   Nedra Hai, PT, DPT 02/07/23 11:24 AM

## 2023-02-07 NOTE — Plan of Care (Signed)
  Problem: Activity: Goal: Ability to tolerate increased activity will improve Outcome: Progressing   Problem: Activity: Goal: Ability to return to baseline activity level will improve Outcome: Progressing   Problem: Activity: Goal: Ability to ambulate and perform ADLs will improve Outcome: Progressing   Problem: Pain Management: Goal: Pain level will decrease Outcome: Progressing

## 2023-02-07 NOTE — Plan of Care (Signed)
Problem: Activity: Goal: Ability to tolerate increased activity will improve Outcome: Progressing   Problem: Pain Management: Goal: Pain level will decrease Outcome: Progressing

## 2023-02-07 NOTE — Progress Notes (Signed)
Physical Therapy Treatment Patient Details Name: [name] MRN: [id number] DOB: 10-Dec-1929 Today's Date: 02/07/2023   History of Present Illness Pt is 89 yo presenting to [facility] ED from [facility] rehab due to unwitnessed fall. Pt did hit her head and was wearing a c-collar found to have acute T2 fracture. PMHx:  osteoporosis,T9,10,11, L1 and L2 compression fxs w/ Kyphoplasty, L hip fracture, OA, CVA, CAD, CA, HTN    PT Comments  Pt received in bed, pleasant and cooperative with PT, motivated to use [facility] for a bowel movement. Still needs heavy levels of physical assistance and heavy cues for safety at times, and still limited by pain, but able to transfer to/from [facility] safely with one person assist this afternoon. Deferred gait due to pain. Unsure if cognition is at baseline- asked me multiple times through session what this building was/where we were, also at end of session asked me "how did I turn on the tv" even though the TV was on when I entered, did ensure she knew how to press nursing button to call for help. Left in bed with all needs met, alarm active. Will continue to progress as able.     If plan is discharge home, recommend the following: Two people to help with walking and/or transfers;Assistance with cooking/housework;Assist for transportation   Can travel by private vehicle     No  Equipment Recommendations  Other (comment) (defer to post acute)    Recommendations for Other Services       Precautions / Restrictions Precautions Precautions: Fall Restrictions Weight Bearing Restrictions: No     Mobility  Bed Mobility Overal bed mobility: Needs Assistance Bed Mobility: Supine to Sit, Sit to Supine     Supine to sit: Mod assist, HOB elevated, Used rails Sit to supine: Max assist, HOB elevated   General bed mobility comments: increased time and effort needed to get to EOB, PT ultimately had to use bed pads to swing hips around; MaxA to return to supine due to increased  pain levels with movement    Transfers Overall transfer level: Needs assistance   Transfers: Bed to chair/wheelchair/BSC   Stand pivot transfers: Min assist, Mod assist         General transfer comment: On first transfer able to perform stand-pivot transfer (bear hug technique) with ModA/Mod cues for safety/sequencing to get to [facility]; on second transfer able to stand-pivot with MinA and use of RW from [facility] back to bed    Ambulation/Gait               General Gait Details: deferred, pain   Stairs             Wheelchair Mobility     Tilt Bed    Modified Rankin (Stroke Patients Only)       Balance Overall balance assessment: Needs assistance Sitting-balance support: Bilateral upper extremity supported, Feet unsupported Sitting balance-Leahy Scale: Fair Sitting balance - Comments: No overt LOB, SBA for safety due to sway     Standing balance-Leahy Scale: Poor Standing balance comment: reliant on external support                            Cognition Arousal: Alert Behavior During Therapy: Anxious Overall Cognitive Status: Impaired/Different from baseline Area of Impairment: Orientation, Problem solving, Awareness, Memory                 Orientation Level: Disoriented to, Place, Time  Awareness: Emergent Problem Solving: Slow processing General Comments: asked multiple times what building this was, named day but said the 25th instead of the 26th        Exercises      General Comments        Pertinent Vitals/Pain Pain Assessment Pain Assessment: Faces Faces Pain Scale: Hurts even more Pain Descriptors / Indicators: Discomfort, Grimacing Pain Intervention(s): Limited activity within patient's tolerance, Monitored during session, Repositioned, Premedicated before session    Home Living                          Prior Function            PT Goals (current goals can now be found in the care plan  section) Acute Rehab PT Goals Patient Stated Goal: To decrease pain PT Goal Formulation: With patient Time For Goal Achievement: 02/17/23 Potential to Achieve Goals: Fair Progress towards PT goals: Progressing toward goals    Frequency    Min 1X/week      PT Plan      Co-evaluation              AM-PAC PT "6 Clicks" Mobility   Outcome Measure  Help needed turning from your back to your side while in a flat bed without using bedrails?: A Lot Help needed moving from lying on your back to sitting on the side of a flat bed without using bedrails?: A Lot Help needed moving to and from a bed to a chair (including a wheelchair)?: A Lot Help needed standing up from a chair using your arms (e.g., wheelchair or bedside chair)?: A Lot Help needed to walk in hospital room?: Total Help needed climbing 3-5 steps with a railing? : Total 6 Click Score: 10    End of Session Equipment Utilized During Treatment: Gait belt Activity Tolerance: Patient tolerated treatment well;Patient limited by pain Patient left: in bed;with bed alarm set;with call bell/phone within reach Nurse Communication: Mobility status PT Visit Diagnosis: Other abnormalities of gait and mobility (R26.89)     Time: 1610-9604 PT Time Calculation (min) (ACUTE ONLY): 38 min  Charges:    $Therapeutic Activity: 38-52 mins PT General Charges $$ ACUTE PT VISIT: 1 Visit                    Nedra Hai, PT, DPT 02/07/23 3:36 PM

## 2023-02-08 ENCOUNTER — Other Ambulatory Visit (HOSPITAL_COMMUNITY): Payer: Medicare Other

## 2023-02-08 DIAGNOSIS — S22020D Wedge compression fracture of second thoracic vertebra, subsequent encounter for fracture with routine healing: Secondary | ICD-10-CM | POA: Diagnosis not present

## 2023-02-08 LAB — CBC
HCT: 42.6 % (ref 36.0–46.0)
Hemoglobin: 13.8 g/dL (ref 12.0–15.0)
MCH: 26.7 pg (ref 26.0–34.0)
MCHC: 32.4 g/dL (ref 30.0–36.0)
MCV: 82.4 fL (ref 80.0–100.0)
Platelets: 479 10*3/uL — ABNORMAL HIGH (ref 150–400)
RBC: 5.17 MIL/uL — ABNORMAL HIGH (ref 3.87–5.11)
RDW: 15.3 % (ref 11.5–15.5)
WBC: 7.6 10*3/uL (ref 4.0–10.5)
nRBC: 0 % (ref 0.0–0.2)

## 2023-02-08 LAB — BASIC METABOLIC PANEL
Anion gap: 9 (ref 5–15)
BUN: 17 mg/dL (ref 8–23)
CO2: 28 mmol/L (ref 22–32)
Calcium: 8.6 mg/dL — ABNORMAL LOW (ref 8.9–10.3)
Chloride: 92 mmol/L — ABNORMAL LOW (ref 98–111)
Creatinine, Ser: 0.77 mg/dL (ref 0.44–1.00)
GFR, Estimated: >60 mL/min (ref >60)
Glucose, Bld: 124 mg/dL — ABNORMAL HIGH (ref 70–99)
Potassium: 3.6 mmol/L (ref 3.5–5.1)
Sodium: 129 mmol/L — ABNORMAL LOW (ref 135–145)

## 2023-02-08 LAB — PHOSPHORUS: Phosphorus: 2.8 mg/dL (ref 2.5–4.6)

## 2023-02-08 LAB — MAGNESIUM: Magnesium: 2 mg/dL (ref 1.7–2.4)

## 2023-02-08 LAB — GLUCOSE, CAPILLARY: Glucose-Capillary: 109 mg/dL — ABNORMAL HIGH (ref 70–99)

## 2023-02-08 NOTE — Plan of Care (Signed)
  Problem: Activity: Goal: Ability to tolerate increased activity will improve Outcome: Progressing   Problem: Pain Management: Goal: Pain level will decrease Outcome: Progressing   

## 2023-02-08 NOTE — Progress Notes (Signed)
PROGRESS NOTE    [name]  OZH:[id number] DOB: [birth year] DOA: 02/03/2023 PCP: [facility]    Brief Narrative:  This 89 y.o. female with medical history significant of CAD, diastolic CHF, hx of CVA, GERD, polycythemia,  hx of breast cancer s/p left lumpectomy, PAF on eliquis, depression and anxiety, frequent falls, hx of DVT and PE, chronic respiratory failure who presented to ED after a fall. She states she was walking from kitchen to the front door and guessed she lost her balance and fell onto her left side. She was admitted to [facility] for pain control and evaluation by IR to see if she is a candidate for kyphoplasty.  She is scheduled for kyphoplasty Early next week.  Assessment & Plan:   Principal Problem:   Compression fracture of T2 vertebra (HCC) Active Problems:   Hypokalemia   Leukocytosis   Chronic respiratory failure with hypoxia (HCC)   Coronary artery disease involving native coronary artery of native heart with unstable angina pectoris (HCC)   Chronic diastolic CHF (congestive heart failure) (HCC)   Polycythemia   Paroxysmal atrial fibrillation (HCC)   Essential hypertension   GERD (gastroesophageal reflux disease)   Depression with anxiety  Compression fracture of T2 vertebra [facility]): CT showing acute T2 compression fracture with approximately 40% vertebral height loss and 3mm retropulsion of the posterior superior cortex.  Neurosurgery consulted. Recommended follow up outpatient. No acute surgical intervention per ED provider Patient has h/o of multiple falls with recent admit for SDH with T3 and C7 compression fractures. T3 kyphoplasty that was attempted and aborted on 5/23 as patient was not able to tolerate c-collar removal, lay prone, maintain adequate saturation in addition to difficulty visualizing target for the procedure.  -Continue pain control with scheduled tylenol 1000mg  TID x 3 days -Continue oxycodone 5mg  q8 hour prn moderate to severe  pain -lidocaine patch - IR consulted to see if she is a candidate for kyphoplasty.  IR tentatively scheduled patient for kyphoplasty on 9/30.  Eliquis is to be held for minimum 5 days.   -Cardiology recommended discontinue Eliquis indefinitely and recommended Plavix monotherapy , but can be stopped at this time and resume post kyphoplasty.- -PT/OT Consulted and recommended SNF.    Hypokalemia: Replaced. Resolved.   Leukocytosis: UA is negative.  CXR does not show any infection.  Continue to monitor.   Chronic respiratory failure with hypoxia (HCC) On 2-3L home oxygen PRN  She was discharged from hospital in 09/2022 on oxygen and was weaned. Uses PRN  Per daughter was just treated for pneumonia in SNF,  recently based off xray, but has no cough, shortness of breath or fever.   Coronary artery disease involving native coronary artery of native heart with unstable angina pectoris (HCC) S/p STEMI in 02/2022 LHC  notable for 95% calcified ostial RCA lesion. Had staged atherectomy and shockwave lithotripsy with DES x1 placed.  She was initially placed on DAPT with aspirin/Brilinta but given her episode of atrial fibrillation she was placed on plavix. -- Continue plavix, Crestor 20 mg daily (no longer on metoprolol, unsure when discontinued)  -Patient denies any chest pain.  Cardiologist recommended Plavix monotherapy.   Chronic diastolic CHF (congestive heart failure) (HCC) Appears Euvolemic.  Echo 09/2022: EF 50-55%. Grade 1 DD.  Holding lasix.    Polycythemia: Follows with oncology  Phlebotomy to maintain hematocrit below 45% Hemoglobin is around 12.    Paroxysmal atrial fibrillation (HCC) In NSR Not on lopressor or amiodarone.  Cardiology  discontinued Eliquis indefinitely.   Essential hypertension Suboptimally controlled. Suspect from back pain.  Continue hydralazine as needed.   GERD (gastroesophageal reflux disease) Continue daily PPI.   Depression with anxiety Continue  with zoloft nightly and ativan BID    Mild hyponatremia:  Sodium of 133 > 129 Recheck labs in am.    Hyperlipidemia:  Resume crestor 20 mg daily.    Estimated body mass index is 17.35 kg/m as calculated from the following:   Height as of 01/23/23: 5\' 5"  (1.651 m).   Weight as of this encounter: 47.3 kg.    DVT prophylaxis: SCDs Code Status: DNR Family Communication:No family at bed side. Disposition Plan:   Status is: Inpatient Remains inpatient appropriate because:      Admitted for pain control and kyphoplasty.   Consultants:  IR  Procedures: None  Antimicrobials:  Anti-infectives (From admission, onward)    None      Subjective: Patient was seen and examined at bedside.  Overnight events noted.   Patient is scheduled for kyphoplasty on Monday.  She reports having pain. She reports having pain in the left shoulder and back.  Objective: Vitals:   02/07/23 2045 02/08/23 0406 02/08/23 0500 02/08/23 1329  BP: (!) 145/83 121/70  (!) 141/75  Pulse: 100 77  92  Resp: 16 14    Temp: 98.1 F (36.7 C)   99.1 F (37.3 C)  TempSrc: Oral   Oral  SpO2: (!) 71% 96%    Weight:   48.3 kg    No intake or output data in the 24 hours ending 02/08/23 1408  Filed Weights   02/06/23 0500 02/07/23 0430 02/08/23 0500  Weight: 48.5 kg 48.3 kg 48.3 kg    Examination:  General exam: Appears comfortable, deconditioned, not in any acute distress. Respiratory system: CTA bilaterally. Respiratory effort normal. RR 13 Cardiovascular system: S1 & S2 heard, RRR. No Murmer, No pedal edema. Gastrointestinal system: Abdomen is non distended, soft and non tender. Normal bowel sounds heard. Central nervous system: Alert and oriented x 3. No focal neurological deficits. Extremities: No edema, No cyanosis, No clubbing Skin: No rashes, lesions or ulcers Psychiatry: Judgement and insight appear normal. Mood & affect appropriate.     Data Reviewed: I have personally reviewed  following labs and imaging studies  CBC: Recent Labs  Lab 02/03/23 0255 02/04/23 0350 02/06/23 0254 02/07/23 0352 02/08/23 0841  WBC 12.3* 7.7 8.1 11.3* 7.6  NEUTROABS 10.4*  --  6.5  --   --   HGB 15.4* 12.8 13.6 14.1 13.8  HCT 49.3* 42.3 44.0 44.4 42.6  MCV 85.0 84.6 84.5 83.1 82.4  PLT 508* 464* 444* 475* 479*   Basic Metabolic Panel: Recent Labs  Lab 02/03/23 0255 02/04/23 0350 02/06/23 0254 02/07/23 0352 02/08/23 0841  NA 132* 133* 131* 129* 129*  K 3.4* 3.8 3.7 4.1 3.6  CL 91* 96* 94* 94* 92*  CO2 29 27 29 27 28   GLUCOSE 120* 100* 125* 104* 124*  BUN <5* 8 8 11 17   CREATININE 0.64 0.82 0.55 0.63 0.77  CALCIUM 9.1 8.7* 8.5* 8.6* 8.6*  MG 2.0  --   --  2.1 2.0  PHOS  --   --   --  3.0 2.8   GFR: Estimated Creatinine Clearance: 34.9 mL/min (by C-G formula based on SCr of 0.77 mg/dL). Liver Function Tests: No results for input(s): "AST", "ALT", "ALKPHOS", "BILITOT", "PROT", "ALBUMIN" in the last 168 hours. No results for input(s): "LIPASE", "AMYLASE" in  the last 168 hours. No results for input(s): "AMMONIA" in the last 168 hours. Coagulation Profile: Recent Labs  Lab 02/03/23 0255  INR 1.1   Cardiac Enzymes: No results for input(s): "CKTOTAL", "CKMB", "CKMBINDEX", "TROPONINI" in the last 168 hours. BNP (last 3 results) No results for input(s): "PROBNP" in the last 8760 hours. HbA1C: No results for input(s): "HGBA1C" in the last 72 hours. CBG: Recent Labs  Lab 02/08/23 1111  GLUCAP 109*   Lipid Profile: No results for input(s): "CHOL", "HDL", "LDLCALC", "TRIG", "CHOLHDL", "LDLDIRECT" in the last 72 hours. Thyroid Function Tests: No results for input(s): "TSH", "T4TOTAL", "FREET4", "T3FREE", "THYROIDAB" in the last 72 hours. Anemia Panel: No results for input(s): "VITAMINB12", "FOLATE", "FERRITIN", "TIBC", "IRON", "RETICCTPCT" in the last 72 hours. Sepsis Labs: No results for input(s): "PROCALCITON", "LATICACIDVEN" in the last 168 hours.  No  results found for this or any previous visit (from the past 240 hour(s)).   Radiology Studies: No results found.  Scheduled Meds:  docusate sodium  100 mg Oral BID   lidocaine  1 patch Transdermal Q24H   LORazepam  0.5 mg Oral BID   pantoprazole  40 mg Oral BID   rosuvastatin  20 mg Oral QHS   sertraline  25 mg Oral QHS   Continuous Infusions:   LOS: 2 days    Time spent: 35 mins    Willeen Niece, MD [facility]   If 7PM-7AM, please contact night-coverage

## 2023-02-09 ENCOUNTER — Other Ambulatory Visit: Payer: Self-pay | Admitting: Radiology

## 2023-02-09 DIAGNOSIS — S22020D Wedge compression fracture of second thoracic vertebra, subsequent encounter for fracture with routine healing: Secondary | ICD-10-CM | POA: Diagnosis not present

## 2023-02-09 MED ORDER — GERHARDT'S BUTT CREAM
TOPICAL_CREAM | Freq: Two times a day (BID) | CUTANEOUS | Status: DC
  Filled 2023-02-09 (×2): qty 1

## 2023-02-09 NOTE — Progress Notes (Signed)
PROGRESS NOTE    [name]  ZOX:[id number] DOB: 24-Apr-1930 DOA: 02/03/2023 PCP: [facility]    Brief Narrative:  This 89 y.o. female with medical history significant of CAD, diastolic CHF, hx of CVA, GERD, polycythemia,  hx of breast cancer s/p left lumpectomy, PAF on eliquis, depression and anxiety, frequent falls, hx of DVT and PE, chronic respiratory failure who presented to ED after a fall. She states she was walking from kitchen to the front door and guessed she lost her balance and fell onto her left side. She was admitted to [facility] for pain control and evaluation by IR to see if she is a candidate for kyphoplasty.  She is scheduled for kyphoplasty Early next week.  Assessment & Plan:   Principal Problem:   Compression fracture of T2 vertebra (HCC) Active Problems:   Hypokalemia   Leukocytosis   Chronic respiratory failure with hypoxia (HCC)   Coronary artery disease involving native coronary artery of native heart with unstable angina pectoris (HCC)   Chronic diastolic CHF (congestive heart failure) (HCC)   Polycythemia   Paroxysmal atrial fibrillation (HCC)   Essential hypertension   GERD (gastroesophageal reflux disease)   Depression with anxiety  Compression fracture of T2 vertebra [facility]): CT showing acute T2 compression fracture with approximately 40% vertebral height loss and 3mm retropulsion of the posterior superior cortex.  Neurosurgery consulted. Recommended follow up outpatient. No acute surgical intervention per ED provider Patient has h/o of multiple falls with recent admit for SDH with T3 and C7 compression fractures. T3 kyphoplasty that was attempted and aborted on 5/23 as patient was not able to tolerate c-collar removal, lay prone, maintain adequate saturation in addition to difficulty visualizing target for the procedure.  -Continue pain control with scheduled tylenol 1000mg  TID x 3 days -Continue oxycodone 5mg  q8 hour prn moderate to severe  pain -lidocaine patch - IR consulted to see if she is a candidate for kyphoplasty.  IR tentatively scheduled patient for kyphoplasty on 9/30.  Eliquis is to be held for minimum 5 days.   -Cardiology recommended discontinue Eliquis indefinitely and recommended Plavix monotherapy , but can be stopped at this time and resume post kyphoplasty.- -PT/OT Consulted and recommended SNF.    Hypokalemia: Replaced. Resolved.   Leukocytosis: > Resolved. UA is negative.  CXR does not show any infection.  Continue to monitor.   Chronic respiratory failure with hypoxia (HCC) On 2-3L home oxygen PRN  She was discharged from hospital in 09/2022 on oxygen and was weaned. Uses PRN  Per daughter was just treated for pneumonia in SNF,  recently based off xray, but has no cough, shortness of breath or fever.   Coronary artery disease involving native coronary artery of native heart with unstable angina pectoris (HCC) S/p STEMI in 02/2022 LHC  notable for 95% calcified ostial RCA lesion. Had staged atherectomy and shockwave lithotripsy with DES x1 placed.  She was initially placed on DAPT with aspirin/Brilinta but given her episode of atrial fibrillation she was placed on plavix. -- Continue plavix, Crestor 20 mg daily (no longer on metoprolol, unsure when discontinued)  -Patient denies any chest pain.  Cardiologist recommended Plavix monotherapy.   Chronic diastolic CHF (congestive heart failure) (HCC) Appears Euvolemic.  Echo 09/2022: EF 50-55%. Grade 1 DD.  Holding lasix.    Polycythemia: Follows with oncology  Phlebotomy to maintain hematocrit below 45% Hemoglobin is around 12.    Paroxysmal atrial fibrillation (HCC) In NSR Not on lopressor or amiodarone.  Cardiology discontinued Eliquis indefinitely.   Essential hypertension Suboptimally controlled. Suspect from back pain.  Continue hydralazine as needed.   GERD (gastroesophageal reflux disease) Continue daily PPI.   Depression with  anxiety Continue with zoloft nightly and ativan BID    Mild hyponatremia:  Sodium of 133 > 129 Recheck labs in am.    Hyperlipidemia:  Resume crestor 20 mg daily.    Estimated body mass index is 17.35 kg/m as calculated from the following:   Height as of 01/23/23: 5\' 5"  (1.651 m).   Weight as of this encounter: 47.3 kg.    DVT prophylaxis: SCDs Code Status: DNR Family Communication:No family at bed side. Disposition Plan:   Status is: Inpatient Remains inpatient appropriate because:      Admitted for pain control and kyphoplasty.   Consultants:  IR  Procedures: None  Antimicrobials:  Anti-infectives (From admission, onward)    None      Subjective: Patient was seen and examined at bedside.  Overnight events noted.   Patient was sitting comfortably in the chair. He reports pain in the shoulder has improved. Patient is scheduled for kyphoplasty on Monday.   Objective: Vitals:   02/08/23 2015 02/09/23 0450 02/09/23 0500 02/09/23 0755  BP: (!) 153/73 (!) 159/92  (!) 160/71  Pulse: 76 71  74  Resp: 17 16  16   Temp: 98.2 F (36.8 C) 98.1 F (36.7 C)  98.3 F (36.8 C)  TempSrc: Oral Oral  Oral  SpO2: 94% 93%  93%  Weight:   49.3 kg     Intake/Output Summary (Last 24 hours) at 02/09/2023 1311 Last data filed at 02/09/2023 0800 Gross per 24 hour  Intake 240 ml  Output --  Net 240 ml    Filed Weights   02/07/23 0430 02/08/23 0500 02/09/23 0500  Weight: 48.3 kg 48.3 kg 49.3 kg    Examination:  General exam: Appears comfortable, deconditioned, not in any acute distress. Respiratory system: CTA bilaterally. Respiratory effort normal. RR 14 Cardiovascular system: S1 & S2 heard, RRR. No Murmer, No pedal edema. Gastrointestinal system: Abdomen is non distended, soft and non tender. Normal bowel sounds heard. Central nervous system: Alert and oriented x 3. No focal neurological deficits. Extremities: No edema, No cyanosis, No clubbing Skin: No rashes,  lesions or ulcers Psychiatry: Judgement and insight appear normal. Mood & affect appropriate.     Data Reviewed: I have personally reviewed following labs and imaging studies  CBC: Recent Labs  Lab 02/03/23 0255 02/04/23 0350 02/06/23 0254 02/07/23 0352 02/08/23 0841  WBC 12.3* 7.7 8.1 11.3* 7.6  NEUTROABS 10.4*  --  6.5  --   --   HGB 15.4* 12.8 13.6 14.1 13.8  HCT 49.3* 42.3 44.0 44.4 42.6  MCV 85.0 84.6 84.5 83.1 82.4  PLT 508* 464* 444* 475* 479*   Basic Metabolic Panel: Recent Labs  Lab 02/03/23 0255 02/04/23 0350 02/06/23 0254 02/07/23 0352 02/08/23 0841  NA 132* 133* 131* 129* 129*  K 3.4* 3.8 3.7 4.1 3.6  CL 91* 96* 94* 94* 92*  CO2 29 27 29 27 28   GLUCOSE 120* 100* 125* 104* 124*  BUN <5* 8 8 11 17   CREATININE 0.64 0.82 0.55 0.63 0.77  CALCIUM 9.1 8.7* 8.5* 8.6* 8.6*  MG 2.0  --   --  2.1 2.0  PHOS  --   --   --  3.0 2.8   GFR: Estimated Creatinine Clearance: 35.6 mL/min (by C-G formula based on SCr of 0.77 mg/dL).  Liver Function Tests: No results for input(s): "AST", "ALT", "ALKPHOS", "BILITOT", "PROT", "ALBUMIN" in the last 168 hours. No results for input(s): "LIPASE", "AMYLASE" in the last 168 hours. No results for input(s): "AMMONIA" in the last 168 hours. Coagulation Profile: Recent Labs  Lab 02/03/23 0255  INR 1.1   Cardiac Enzymes: No results for input(s): "CKTOTAL", "CKMB", "CKMBINDEX", "TROPONINI" in the last 168 hours. BNP (last 3 results) No results for input(s): "PROBNP" in the last 8760 hours. HbA1C: No results for input(s): "HGBA1C" in the last 72 hours. CBG: Recent Labs  Lab 02/08/23 1111  GLUCAP 109*   Lipid Profile: No results for input(s): "CHOL", "HDL", "LDLCALC", "TRIG", "CHOLHDL", "LDLDIRECT" in the last 72 hours. Thyroid Function Tests: No results for input(s): "TSH", "T4TOTAL", "FREET4", "T3FREE", "THYROIDAB" in the last 72 hours. Anemia Panel: No results for input(s): "VITAMINB12", "FOLATE", "FERRITIN", "TIBC",  "IRON", "RETICCTPCT" in the last 72 hours. Sepsis Labs: No results for input(s): "PROCALCITON", "LATICACIDVEN" in the last 168 hours.  No results found for this or any previous visit (from the past 240 hour(s)).   Radiology Studies: No results found.  Scheduled Meds:  docusate sodium  100 mg Oral BID   Gerhardt's butt cream   Topical BID   lidocaine  1 patch Transdermal Q24H   LORazepam  0.5 mg Oral BID   pantoprazole  40 mg Oral BID   rosuvastatin  20 mg Oral QHS   sertraline  25 mg Oral QHS   Continuous Infusions:   LOS: 3 days    Time spent: 35 mins    Willeen Niece, MD [facility]   If 7PM-7AM, please contact night-coverage

## 2023-02-09 NOTE — Progress Notes (Signed)
Patient, cannot be redirected, keeps on getting out of bed several times without calling for assistance. Paged on call fir sitter order.

## 2023-02-09 NOTE — Progress Notes (Signed)
Occupational Therapy Treatment Patient Details Name: [name] MRN: [id number] DOB: May 08, 1930 Today's Date: 02/09/2023   History of present illness Pt is 89 yo presenting to [facility] ED from [facility] rehab due to unwitnessed fall. Pt did hit her head and was wearing a c-collar found to have acute T2 fracture. PMHx:  osteoporosis,T9,10,11, L1 and L2 compression fxs w/ Kyphoplasty, L hip fracture, OA, CVA, CAD, CA, HTN   OT comments  Pt currently mod assist for sit to stand from the EOB with min assist for transfers and functional mobility with use of the RW.  Increased confusion this session compared to last with this OT.  Pt hallucination seeing children in the room and trying to reach into the air while standing to pick up objects.  When asked what she was picking up she would get increasingly agitated that therapist couldn't see the "wood chips" she was trying to grab.  Recommend continued acute care OT at this time to progress ADL independence, balance, awareness, and cognition.  Feel she will need continued inpatient follow up therapy, <3 hours/day post acute stay.         If plan is discharge home, recommend the following:  Assist for transportation;Help with stairs or ramp for entrance;Assistance with cooking/housework;Direct supervision/assist for medications management;A lot of help with walking and/or transfers;A lot of help with bathing/dressing/bathroom;Supervision due to cognitive status   Equipment Recommendations  Other (comment) (TBD next venue of care)       Precautions / Restrictions Precautions Precautions: Fall Restrictions Weight Bearing Restrictions: No       Mobility Bed Mobility Overal bed mobility: Needs Assistance Bed Mobility: Sit to Supine       Sit to supine: Max assist, HOB elevated   General bed mobility comments: Pt resistant to following back precautions for sit to supine with therapist having to assist with bringing trunk down to the bed and  lifting LEs up.    Transfers Overall transfer level: Needs assistance   Transfers: Sit to/from Stand Sit to Stand: Mod assist           General transfer comment: Mod assist for sit to stand with min assist once standing to ambulate out in the hallway with use of the RW.     Balance Overall balance assessment: Needs assistance Sitting-balance support: Bilateral upper extremity supported, Feet unsupported Sitting balance-Leahy Scale: Fair Sitting balance - Comments: Able to maintain balance sitting EOB     Standing balance-Leahy Scale: Poor Standing balance comment: Needs UE support for standing and mobility                           ADL either performed or assessed with clinical judgement   ADL                           Toilet Transfer: Moderate assistance;Ambulation;Rolling walker (2 wheels) Toilet Transfer Details (indicate cue type and reason): simulated secondary to patient declining the need to go         Functional mobility during ADLs: Moderate assistance;Rolling walker (2 wheels) (ambulation) General ADL Comments: Pt with increased confusion but noted sitting on EOB with nursing at start of session wanting to get up.  She was not aware that she was in her room and wanted to leave to go to her room.  Mod assist for sit to stand from the bed with use of the RW.  Mod demonstrational cueing for re-direction with mobility out in the hallway and back to the room.  Pt slightly agitated at times with re-direction.      Cognition Arousal: Alert Behavior During Therapy: Restless Overall Cognitive Status: Impaired/Different from baseline Area of Impairment: Orientation, Problem solving, Awareness, Memory                     Memory: Decreased short-term memory     Awareness: Intellectual Problem Solving: Slow processing, Difficulty sequencing, Requires verbal cues General Comments: Pt with increased confusion, able to state place, but says  her room is down the hall.  She exhibits hallucinations reaching in the air to pick up something while standing which she said was "wood chips".  Also, asked "Where is that child at?".                   Pertinent Vitals/ Pain       Pain Assessment Pain Assessment: Faces Faces Pain Scale: Hurts little more Pain Location: chest, back Pain Descriptors / Indicators: Discomfort, Grimacing Pain Intervention(s): Limited activity within patient's tolerance, Repositioned         Frequency  Min 1X/week        Progress Toward Goals  OT Goals(current goals can now be found in the care plan section)  Progress towards OT goals: Progressing toward goals  Acute Rehab OT Goals Patient Stated Goal: Pt wanted to walk OT Goal Formulation: Patient unable to participate in goal setting Time For Goal Achievement: 02/18/23 Potential to Achieve Goals: Fair  Plan         AM-PAC OT "6 Clicks" Daily Activity     Outcome Measure   Help from another person eating meals?: A Little Help from another person taking care of personal grooming?: A Little Help from another person toileting, which includes using toliet, bedpan, or urinal?: A Lot Help from another person bathing (including washing, rinsing, drying)?: A Lot Help from another person to put on and taking off regular upper body clothing?: A Lot Help from another person to put on and taking off regular lower body clothing?: A Lot 6 Click Score: 14    End of Session Equipment Utilized During Treatment: Rolling walker (2 wheels);Gait belt  OT Visit Diagnosis: Repeated falls (R29.6);Muscle weakness (generalized) (M62.81);Pain;Other symptoms and signs involving cognitive function Pain - Right/Left:  (chest and back pain)   Activity Tolerance Patient limited by pain   Patient Left in bed;with call bell/phone within reach;with bed alarm set   Nurse Communication Mobility status        Time: 1545-1600 OT Time Calculation (min): 15  min  Charges: OT General Charges $OT Visit: 1 Visit OT Treatments $Self Care/Home Management : 8-22 mins  Matisse Salais Perrin Maltese, OTR/L Acute Rehabilitation Services  Office [phone] 02/09/2023

## 2023-02-09 NOTE — Plan of Care (Signed)
  Problem: Education: Goal: Understanding of cardiac disease, CV risk reduction, and recovery process will improve Outcome: Progressing   Problem: Activity: Goal: Ability to tolerate increased activity will improve Outcome: Progressing   Problem: Cardiac: Goal: Ability to achieve and maintain adequate cardiovascular perfusion will improve Outcome: Progressing   Problem: Activity: Goal: Ability to return to baseline activity level will improve Outcome: Progressing

## 2023-02-10 DIAGNOSIS — S22020D Wedge compression fracture of second thoracic vertebra, subsequent encounter for fracture with routine healing: Secondary | ICD-10-CM | POA: Diagnosis not present

## 2023-02-10 MED ORDER — HYDRALAZINE HCL 50 MG PO TABS: 50.0000 mg | ORAL_TABLET | Freq: Three times a day (TID) | ORAL | Status: DC | PRN

## 2023-02-10 NOTE — Progress Notes (Signed)
   02/10/23 1437  Mobility  Activity  (Exercises)  Level of Assistance Standby assist, set-up cues, supervision of patient - no hands on  Range of Motion/Exercises Active  Activity Response Tolerated fair  Mobility Referral Yes  $Mobility charge 1 Mobility  Mobility Specialist Start Time (ACUTE ONLY) 1422  Mobility Specialist Stop Time (ACUTE ONLY) 1433  Mobility Specialist Time Calculation (min) (ACUTE ONLY) 11 min   Mobility Specialist: Progress Note  Pt agreeable to mobility session - received in bed. Required SV with heavy verbal cues. Active exercises performed x8 hand squeezes x4 leg raises, further exercises deferred d/t c/o pain in both UE in chest and back. PT stated she "can't." Pt was oriented to person, place, time and date. MS tried to encourage pt multiple times to ambulate or transfer, pt denied. Left in bed with all needs met - call bell within reach. NT present.   Barnie Mort, BS Mobility Specialist Please contact via SecureChat or Rehab office at [phone].

## 2023-02-10 NOTE — Progress Notes (Signed)
PROGRESS NOTE    [name]  ZHY:[id number] DOB: 22-Sep-1929 DOA: 02/03/2023 PCP: [facility]    Brief Narrative:  This 89 y.o. female with medical history significant of CAD, diastolic CHF, hx of CVA, GERD, polycythemia,  hx of breast cancer s/p left lumpectomy, PAF on eliquis, depression and anxiety, frequent falls, hx of DVT and PE, chronic respiratory failure who presented to ED after a fall. She states she was walking from kitchen to the front door and guessed she lost her balance and fell onto her left side. She was admitted to [facility] for pain control and evaluation by IR to see if she is a candidate for kyphoplasty.  She is scheduled for kyphoplasty Early next week.  Assessment & Plan:   Principal Problem:   Compression fracture of T2 vertebra (HCC) Active Problems:   Hypokalemia   Leukocytosis   Chronic respiratory failure with hypoxia (HCC)   Coronary artery disease involving native coronary artery of native heart with unstable angina pectoris (HCC)   Chronic diastolic CHF (congestive heart failure) (HCC)   Polycythemia   Paroxysmal atrial fibrillation (HCC)   Essential hypertension   GERD (gastroesophageal reflux disease)   Depression with anxiety  Compression fracture of T2 vertebra [facility]): CT showing acute T2 compression fracture with approximately 40% vertebral height loss and 3mm retropulsion of the posterior superior cortex.  Neurosurgery consulted. Recommended follow up outpatient. No acute surgical intervention per ED provider Patient has h/o of multiple falls with recent admit for SDH with T3 and C7 compression fractures. T3 kyphoplasty that was attempted and aborted on 5/23 as patient was not able to tolerate c-collar removal, lay prone, maintain adequate saturation in addition to difficulty visualizing target for the procedure.  -Continue pain control with scheduled tylenol 1000mg  TID x 3 days -Continue oxycodone 5mg  q8 hour prn for moderate to  severe pain. -lidocaine patch - IR consulted to see if she is a candidate for kyphoplasty.  IR tentatively scheduled patient for kyphoplasty on 9/30.  Eliquis is to be held for minimum 5 days.   -Cardiology recommended discontinue Eliquis indefinitely and recommended Plavix monotherapy , but can be stopped at this time and resume post kyphoplasty.- -PT/OT Consulted and recommended SNF.    Hypokalemia: Replaced. Resolved.   Leukocytosis: > Resolved. UA is negative.  CXR does not show any infection.  Continue to monitor.   Chronic respiratory failure with hypoxia (HCC) On 2-3L home oxygen PRN  She was discharged from hospital in 09/2022 on oxygen and was weaned. Uses PRN  Per daughter was just treated for pneumonia in SNF,  recently based off xray, but has no cough, shortness of breath or fever.   Coronary artery disease involving native coronary artery of native heart with unstable angina pectoris (HCC) S/p STEMI in 02/2022 LHC  notable for 95% calcified ostial RCA lesion. Had staged atherectomy and shockwave lithotripsy with DES x1 placed.  She was initially placed on DAPT with aspirin/Brilinta but given her episode of atrial fibrillation she was placed on plavix. -- Continue plavix, Crestor 20 mg daily (no longer on metoprolol, unsure when discontinued)  -Patient denies any chest pain.  Cardiologist recommended Plavix monotherapy.   Chronic diastolic CHF (congestive heart failure) (HCC) Appears Euvolemic.  Echo 09/2022: EF 50-55%. Grade 1 DD.  Holding lasix.    Polycythemia: Follows with oncology  Phlebotomy to maintain hematocrit below 45% Hemoglobin is around 12.    Paroxysmal atrial fibrillation (HCC) In NSR Not on lopressor or  amiodarone.  Cardiology discontinued Eliquis indefinitely.   Essential hypertension Suboptimally controlled. Suspect from back pain.  Continue hydralazine as needed.   GERD (gastroesophageal reflux disease) Continue daily PPI.   Depression with  anxiety Continue with zoloft nightly and ativan BID    Mild hyponatremia:  Sodium of 133 > 129 Recheck labs in am.    Hyperlipidemia:  Resume crestor 20 mg daily.    Estimated body mass index is 17.35 kg/m as calculated from the following:   Height as of 01/23/23: 5\' 5"  (1.651 m).   Weight as of this encounter: 47.3 kg.    DVT prophylaxis: SCDs Code Status: DNR Family Communication:No family at bed side. Disposition Plan:   Status is: Inpatient Remains inpatient appropriate because:      Admitted for pain control and kyphoplasty.   Consultants:  IR  Procedures: None  Antimicrobials:  Anti-infectives (From admission, onward)    None      Subjective: Patient was seen and examined at bedside.  Overnight events noted.   Patient was agitated,  trying to get out of bed last night.  Now feels much improved in the morning. Patient is scheduled for kyphoplasty on Monday.   Objective: Vitals:   02/09/23 0755 02/09/23 1632 02/09/23 2053 02/10/23 0853  BP: (!) 160/71 (!) 156/75 (!) 176/88 (!) 148/81  Pulse: 74 73 85 82  Resp: 16 18 18 16   Temp: 98.3 F (36.8 C) 98.1 F (36.7 C)  97.6 F (36.4 C)  TempSrc: Oral Oral  Oral  SpO2: 93% 96% 92% 91%  Weight:        Intake/Output Summary (Last 24 hours) at 02/10/2023 1201 Last data filed at 02/10/2023 1025 Gross per 24 hour  Intake 680 ml  Output 600 ml  Net 80 ml    Filed Weights   02/07/23 0430 02/08/23 0500 02/09/23 0500  Weight: 48.3 kg 48.3 kg 49.3 kg    Examination:  General exam: Appears comfortable, deconditioned, not in any acute distress. Respiratory system: CTA bilaterally. Respiratory effort normal. RR 14 Cardiovascular system: S1 & S2 heard, RRR. No Murmer, No pedal edema. Gastrointestinal system: Abdomen is non distended, soft and non tender. Normal bowel sounds heard. Central nervous system: Alert and oriented x 3. No focal neurological deficits. Extremities: No edema, No cyanosis, No  clubbing Skin: No rashes, lesions or ulcers Psychiatry: Judgement and insight appear normal. Mood & affect appropriate.     Data Reviewed: I have personally reviewed following labs and imaging studies  CBC: Recent Labs  Lab 02/04/23 0350 02/06/23 0254 02/07/23 0352 02/08/23 0841  WBC 7.7 8.1 11.3* 7.6  NEUTROABS  --  6.5  --   --   HGB 12.8 13.6 14.1 13.8  HCT 42.3 44.0 44.4 42.6  MCV 84.6 84.5 83.1 82.4  PLT 464* 444* 475* 479*   Basic Metabolic Panel: Recent Labs  Lab 02/04/23 0350 02/06/23 0254 02/07/23 0352 02/08/23 0841  NA 133* 131* 129* 129*  K 3.8 3.7 4.1 3.6  CL 96* 94* 94* 92*  CO2 27 29 27 28   GLUCOSE 100* 125* 104* 124*  BUN 8 8 11 17   CREATININE 0.82 0.55 0.63 0.77  CALCIUM 8.7* 8.5* 8.6* 8.6*  MG  --   --  2.1 2.0  PHOS  --   --  3.0 2.8   GFR: Estimated Creatinine Clearance: 35.6 mL/min (by C-G formula based on SCr of 0.77 mg/dL). Liver Function Tests: No results for input(s): "AST", "ALT", "ALKPHOS", "BILITOT", "PROT", "ALBUMIN" in  the last 168 hours. No results for input(s): "LIPASE", "AMYLASE" in the last 168 hours. No results for input(s): "AMMONIA" in the last 168 hours. Coagulation Profile: No results for input(s): "INR", "PROTIME" in the last 168 hours.  Cardiac Enzymes: No results for input(s): "CKTOTAL", "CKMB", "CKMBINDEX", "TROPONINI" in the last 168 hours. BNP (last 3 results) No results for input(s): "PROBNP" in the last 8760 hours. HbA1C: No results for input(s): "HGBA1C" in the last 72 hours. CBG: Recent Labs  Lab 02/08/23 1111  GLUCAP 109*   Lipid Profile: No results for input(s): "CHOL", "HDL", "LDLCALC", "TRIG", "CHOLHDL", "LDLDIRECT" in the last 72 hours. Thyroid Function Tests: No results for input(s): "TSH", "T4TOTAL", "FREET4", "T3FREE", "THYROIDAB" in the last 72 hours. Anemia Panel: No results for input(s): "VITAMINB12", "FOLATE", "FERRITIN", "TIBC", "IRON", "RETICCTPCT" in the last 72 hours. Sepsis Labs: No  results for input(s): "PROCALCITON", "LATICACIDVEN" in the last 168 hours.  No results found for this or any previous visit (from the past 240 hour(s)).   Radiology Studies: No results found.  Scheduled Meds:  docusate sodium  100 mg Oral BID   Gerhardt's butt cream   Topical BID   lidocaine  1 patch Transdermal Q24H   LORazepam  0.5 mg Oral BID   pantoprazole  40 mg Oral BID   rosuvastatin  20 mg Oral QHS   sertraline  25 mg Oral QHS   Continuous Infusions:   LOS: 4 days    Time spent: 35 mins    Willeen Niece, MD [facility]   If 7PM-7AM, please contact night-coverage

## 2023-02-10 NOTE — Plan of Care (Signed)
  Problem: Activity: Goal: Ability to tolerate increased activity will improve Outcome: Progressing   Problem: Cardiac: Goal: Ability to achieve and maintain adequate cardiovascular perfusion will improve Outcome: Progressing   Problem: Activity: Goal: Ability to return to baseline activity level will improve Outcome: Progressing

## 2023-02-11 ENCOUNTER — Inpatient Hospital Stay (HOSPITAL_COMMUNITY): Payer: Medicare Other

## 2023-02-11 ENCOUNTER — Inpatient Hospital Stay (HOSPITAL_COMMUNITY): Payer: Medicare Other | Admitting: Certified Registered"

## 2023-02-11 ENCOUNTER — Encounter (HOSPITAL_COMMUNITY)
Admission: EM | Disposition: A | Discharge status: Skilled Nursing Facility | Payer: Self-pay | Source: Skilled Nursing Facility | Attending: Family Medicine

## 2023-02-11 DIAGNOSIS — I2510 Atherosclerotic heart disease of native coronary artery without angina pectoris: Secondary | ICD-10-CM

## 2023-02-11 DIAGNOSIS — S22030A Wedge compression fracture of third thoracic vertebra, initial encounter for closed fracture: Secondary | ICD-10-CM

## 2023-02-11 DIAGNOSIS — S22020A Wedge compression fracture of second thoracic vertebra, initial encounter for closed fracture: Secondary | ICD-10-CM | POA: Diagnosis not present

## 2023-02-11 DIAGNOSIS — S22020D Wedge compression fracture of second thoracic vertebra, subsequent encounter for fracture with routine healing: Secondary | ICD-10-CM | POA: Diagnosis not present

## 2023-02-11 HISTORY — PX: RADIOLOGY WITH ANESTHESIA: SHX6223

## 2023-02-11 HISTORY — PX: IR KYPHO THORACIC WITH BONE BIOPSY: IMG5518

## 2023-02-11 LAB — BASIC METABOLIC PANEL
Anion gap: 11 (ref 5–15)
BUN: 14 mg/dL (ref 8–23)
CO2: 25 mmol/L (ref 22–32)
Calcium: 8.6 mg/dL — ABNORMAL LOW (ref 8.9–10.3)
Chloride: 94 mmol/L — ABNORMAL LOW (ref 98–111)
Creatinine, Ser: 0.68 mg/dL (ref 0.44–1.00)
GFR, Estimated: >60 mL/min (ref >60)
Glucose, Bld: 116 mg/dL — ABNORMAL HIGH (ref 70–99)
Potassium: 4.3 mmol/L (ref 3.5–5.1)
Sodium: 130 mmol/L — ABNORMAL LOW (ref 135–145)

## 2023-02-11 LAB — PHOSPHORUS: Phosphorus: 3.9 mg/dL (ref 2.5–4.6)

## 2023-02-11 LAB — CBC
HCT: 40.6 % (ref 36.0–46.0)
Hemoglobin: 12.4 g/dL (ref 12.0–15.0)
MCH: 25.4 pg — ABNORMAL LOW (ref 26.0–34.0)
MCHC: 30.5 g/dL (ref 30.0–36.0)
MCV: 83.2 fL (ref 80.0–100.0)
Platelets: 422 10*3/uL — ABNORMAL HIGH (ref 150–400)
RBC: 4.88 MIL/uL (ref 3.87–5.11)
RDW: 15.3 % (ref 11.5–15.5)
WBC: 9.1 10*3/uL (ref 4.0–10.5)
nRBC: 0 % (ref 0.0–0.2)

## 2023-02-11 LAB — SURGICAL PCR SCREEN
MRSA, PCR: NEGATIVE
Staphylococcus aureus: NEGATIVE

## 2023-02-11 LAB — MAGNESIUM: Magnesium: 1.9 mg/dL (ref 1.7–2.4)

## 2023-02-11 SURGERY — IR WITH ANESTHESIA
Anesthesia: General

## 2023-02-11 MED ORDER — VANCOMYCIN HCL 750 MG/150ML IV SOLN
750.0000 mg | INTRAVENOUS | Status: AC
  Administered 2023-02-11: 750 mg via INTRAVENOUS
  Filled 2023-02-11: qty 150

## 2023-02-11 MED ORDER — CEFAZOLIN SODIUM-DEXTROSE 1-4 GM/50ML-% IV SOLN
1.0000 g | INTRAVENOUS | Status: DC
  Filled 2023-02-11: qty 50

## 2023-02-11 MED ORDER — TOBRAMYCIN SULFATE 1.2 G IJ SOLR
1.2000 g | INTRAMUSCULAR | Status: AC
  Administered 2023-02-11: 1.2 g via TOPICAL

## 2023-02-11 MED ORDER — CHLORHEXIDINE GLUCONATE 0.12 % MT SOLN
OROMUCOSAL | Status: AC
  Filled 2023-02-11: qty 15

## 2023-02-11 MED ORDER — FENTANYL CITRATE (PF) 100 MCG/2ML IJ SOLN
INTRAMUSCULAR | Status: AC
  Filled 2023-02-11: qty 2

## 2023-02-11 MED ORDER — SODIUM CHLORIDE 0.9 % IV SOLN: INTRAVENOUS | Status: AC

## 2023-02-11 MED ORDER — PHENYLEPHRINE 80 MCG/ML (10ML) SYRINGE FOR IV PUSH (FOR BLOOD PRESSURE SUPPORT)
PREFILLED_SYRINGE | INTRAVENOUS | Status: DC | PRN
  Administered 2023-02-11: 80 ug via INTRAVENOUS

## 2023-02-11 MED ORDER — ACETAMINOPHEN 500 MG PO TABS
ORAL_TABLET | ORAL | Status: AC
  Administered 2023-02-11: 750 mg via ORAL
  Filled 2023-02-11: qty 2

## 2023-02-11 MED ORDER — ONDANSETRON HCL 4 MG/2ML IJ SOLN: 4.0000 mg | Freq: Once | INTRAMUSCULAR | Status: DC | PRN

## 2023-02-11 MED ORDER — FENTANYL CITRATE (PF) 100 MCG/2ML IJ SOLN: 25.0000 ug | INTRAMUSCULAR | Status: DC | PRN

## 2023-02-11 MED ORDER — ONDANSETRON HCL 4 MG/2ML IJ SOLN
INTRAMUSCULAR | Status: DC | PRN
  Administered 2023-02-11: 4 mg via INTRAVENOUS

## 2023-02-11 MED ORDER — PROPOFOL 10 MG/ML IV BOLUS
INTRAVENOUS | Status: DC | PRN
Start: 2023-02-11 — End: 2023-02-11
  Administered 2023-02-11: 60 mg via INTRAVENOUS

## 2023-02-11 MED ORDER — FENTANYL CITRATE (PF) 250 MCG/5ML IJ SOLN
INTRAMUSCULAR | Status: DC | PRN
  Administered 2023-02-11: 25 ug via INTRAVENOUS

## 2023-02-11 MED ORDER — ACETAMINOPHEN 500 MG PO TABS: 750.0000 mg | ORAL_TABLET | Freq: Once | ORAL | Status: AC

## 2023-02-11 MED ORDER — PHENYLEPHRINE HCL-NACL 20-0.9 MG/250ML-% IV SOLN
INTRAVENOUS | Status: DC | PRN
  Administered 2023-02-11: 20 ug/min via INTRAVENOUS

## 2023-02-11 MED ORDER — SUGAMMADEX SODIUM 200 MG/2ML IV SOLN
INTRAVENOUS | Status: DC | PRN
  Administered 2023-02-11: 100 mg via INTRAVENOUS

## 2023-02-11 MED ORDER — OXYCODONE HCL 5 MG/5ML PO SOLN: 5.0000 mg | Freq: Once | ORAL | Status: DC | PRN

## 2023-02-11 MED ORDER — TOBRAMYCIN SULFATE 1.2 G IJ SOLR
INTRAMUSCULAR | Status: AC
  Filled 2023-02-11: qty 1.2

## 2023-02-11 MED ORDER — BUPIVACAINE HCL (PF) 0.25 % IJ SOLN
INTRAMUSCULAR | Status: AC
  Filled 2023-02-11: qty 60

## 2023-02-11 MED ORDER — BUPIVACAINE HCL 0.25 % IJ SOLN
30.0000 mL | Freq: Once | INTRAMUSCULAR | Status: AC
  Administered 2023-02-11: 10 mL

## 2023-02-11 MED ORDER — CHLORHEXIDINE GLUCONATE 0.12 % MT SOLN: 15.0000 mL | Freq: Once | OROMUCOSAL | Status: DC

## 2023-02-11 MED ORDER — PROPOFOL 500 MG/50ML IV EMUL
INTRAVENOUS | Status: DC | PRN
  Administered 2023-02-11: 100 ug/kg/min via INTRAVENOUS

## 2023-02-11 MED ORDER — ROCURONIUM BROMIDE 10 MG/ML (PF) SYRINGE
PREFILLED_SYRINGE | INTRAVENOUS | Status: DC | PRN
  Administered 2023-02-11: 50 mg via INTRAVENOUS

## 2023-02-11 MED ORDER — ORAL CARE MOUTH RINSE: 15.0000 mL | Freq: Once | OROMUCOSAL | Status: DC

## 2023-02-11 MED ORDER — LIDOCAINE 2% (20 MG/ML) 5 ML SYRINGE
INTRAMUSCULAR | Status: DC | PRN
  Administered 2023-02-11: 40 mg via INTRAVENOUS

## 2023-02-11 MED ORDER — OXYCODONE HCL 5 MG PO TABS: 5.0000 mg | ORAL_TABLET | Freq: Once | ORAL | Status: DC | PRN

## 2023-02-11 MED ORDER — LACTATED RINGERS IV SOLN: INTRAVENOUS | Status: DC

## 2023-02-11 MED ORDER — VANCOMYCIN HCL IN DEXTROSE 1-5 GM/200ML-% IV SOLN
1000.0000 mg | INTRAVENOUS | Status: DC
  Filled 2023-02-11: qty 200

## 2023-02-11 NOTE — Anesthesia Procedure Notes (Addendum)
Procedure Name: Intubation Date/Time: 02/11/2023 10:38 AM  Performed by: Alwyn Ren, CRNAPre-anesthesia Checklist: Patient identified, Emergency Drugs available, Suction available and Patient being monitored Patient Re-evaluated:Patient Re-evaluated prior to induction Oxygen Delivery Method: Circle system utilized Preoxygenation: Pre-oxygenation with 100% oxygen Induction Type: IV induction Ventilation: Mask ventilation without difficulty Laryngoscope Size: Miller and 2 Grade View: Grade I Tube type: Oral Tube size: 7.0 mm Number of attempts: 1 Airway Equipment and Method: Stylet and Oral airway Placement Confirmation: ETT inserted through vocal cords under direct vision, positive ETCO2 and breath sounds checked- equal and bilateral Secured at: 21 cm Tube secured with: Tape Dental Injury: Teeth and Oropharynx as per pre-operative assessment

## 2023-02-11 NOTE — Transfer of Care (Signed)
Immediate Anesthesia Transfer of Care Note  Patient: [name]  Procedure(s) Performed: T2, T3, T5 Kyphoplasty  Patient Location: PACU  Anesthesia Type:General  Level of Consciousness: awake  Airway & Oxygen Therapy: Patient Spontanous Breathing and Patient connected to face mask oxygen  Post-op Assessment: Report given to RN and Post -op Vital signs reviewed and stable  Post vital signs: Reviewed and stable  Last Vitals:  Vitals Value Taken Time  BP 150/83 02/11/23 1225  Temp    Pulse 73 02/11/23 1227  Resp 15 02/11/23 1227  SpO2 95 % 02/11/23 1227  Vitals shown include unfiled device data.  Last Pain:  Vitals:   02/11/23 0929  TempSrc: Oral  PainSc:       Patients Stated Pain Goal: 1 (02/09/23 1045)  Complications: No notable events documented.

## 2023-02-11 NOTE — Progress Notes (Signed)
PROGRESS NOTE    [name]  ZOX:[id number] DOB: 03/28/1930 DOA: 02/03/2023 PCP: [facility]    Brief Narrative:  This 89 y.o. female with medical history significant of CAD, diastolic CHF, hx of CVA, GERD, polycythemia,  hx of breast cancer s/p left lumpectomy, PAF on eliquis, depression and anxiety, frequent falls, hx of DVT and PE, chronic respiratory failure who presented to ED after a fall. She states she was walking from kitchen to the front door and guessed she lost her balance and fell onto her left side. She was admitted to [facility] for pain control and evaluation by IR to see if she is a candidate for kyphoplasty.  She underwent successful kyphoplasty , tolerated well.  Assessment & Plan:   Principal Problem:   Compression fracture of T2 vertebra (HCC) Active Problems:   Hypokalemia   Leukocytosis   Chronic respiratory failure with hypoxia (HCC)   Coronary artery disease involving native coronary artery of native heart with unstable angina pectoris (HCC)   Chronic diastolic CHF (congestive heart failure) (HCC)   Polycythemia   Paroxysmal atrial fibrillation (HCC)   Essential hypertension   GERD (gastroesophageal reflux disease)   Depression with anxiety  Compression fracture of T2 vertebra [employer]): CT showing acute T2 compression fracture with approximately 40% vertebral height loss and 3mm retropulsion of the posterior superior cortex.  Neurosurgery consulted. Recommended follow up outpatient. No acute surgical intervention per ED provider -Continue pain control with scheduled tylenol 1000mg  TID x 3 days -Continue oxycodone 5mg  q8 hour prn for moderate to severe pain. -lidocaine patch - IR consulted to see if she is a candidate for kyphoplasty.   Eliquis is to be held for minimum 5 days.   -Cardiology recommended discontinue Eliquis indefinitely and recommended Plavix monotherapy , but can be stopped at this time and resume post kyphoplasty.- -PT/OT  Consulted and recommended SNF.  -Patient underwent successful kyphoplasty, tolerated well.   Hypokalemia: Replaced. Resolved.   Leukocytosis: > Resolved. UA is negative.  CXR does not show any infection.  Continue to monitor.   Chronic respiratory failure with hypoxia (HCC) On 2-3L home oxygen PRN  She was discharged from hospital in 09/2022 on oxygen and was weaned. Uses PRN  Per daughter was just treated for pneumonia in SNF,  recently based off xray, but has no cough, shortness of breath or fever.   Coronary artery disease involving native coronary artery of native heart with unstable angina pectoris (HCC) S/p STEMI in 02/2022 LHC  notable for 95% calcified ostial RCA lesion. Had staged atherectomy and shockwave lithotripsy with DES x1 placed.  She was initially placed on DAPT with aspirin/Brilinta but given her episode of atrial fibrillation she was placed on plavix. -- Continue plavix, Crestor 20 mg daily (no longer on metoprolol, unsure when discontinued)  -Patient denies any chest pain.  Cardiologist recommended Plavix monotherapy.   Chronic diastolic CHF (congestive heart failure) (HCC) Appears Euvolemic.  Echo 09/2022: EF 50-55%. Grade 1 DD.  Holding lasix.    Polycythemia: Follows with oncology  Phlebotomy to maintain hematocrit below 45% Hemoglobin is around 12.    Paroxysmal atrial fibrillation (HCC) In NSR. HR controlled. Not on lopressor or amiodarone.  Cardiology discontinued Eliquis indefinitely.   Essential hypertension Suboptimally controlled. Suspect from back pain.  Continue hydralazine as needed.   GERD (gastroesophageal reflux disease) Continue daily PPI.   Depression with anxiety Continue with zoloft nightly and ativan BID    Mild hyponatremia:  Sodium of 133 >  129 Recheck labs in am.    Hyperlipidemia:  Resume crestor 20 mg daily.    Estimated body mass index is 17.35 kg/m as calculated from the following:   Height as of 01/23/23: 5\' 5"   (1.651 m).   Weight as of this encounter: 47.3 kg.    DVT prophylaxis: SCDs Code Status: DNR Family Communication:No family at bed side. Disposition Plan:   Status is: Inpatient Remains inpatient appropriate because:      Admitted for pain control and kyphoplasty. S/p Khypoplasty    Consultants:  IR  Procedures: None  Antimicrobials:  Anti-infectives (From admission, onward)    Start     Dose/Rate Route Frequency Ordered Stop   02/11/23 1145  tobramycin (NEBCIN) powder 1.2 g        1.2 g Topical To Surgery 02/11/23 1136 02/11/23 1137   02/11/23 1000  ceFAZolin (ANCEF) IVPB 1 g/50 mL premix  Status:  Discontinued        1 g 100 mL/hr over 30 Minutes Intravenous On call 02/11/23 0753 02/11/23 0934   02/11/23 1000  vancomycin (VANCOCIN) IVPB 1000 mg/200 mL premix  Status:  Discontinued        1,000 mg 200 mL/hr over 60 Minutes Intravenous On call 02/11/23 0934 02/11/23 0942   02/11/23 1000  vancomycin (VANCOREADY) IVPB 750 mg/150 mL        750 mg 150 mL/hr over 60 Minutes Intravenous On call 02/11/23 9147 02/11/23 1050      Subjective: Patient was seen and examined at bedside.  Overnight events noted.   Patient reports feeling better,  still reports having pain.   She underwent successful kyphoplasty.  Tolerated well  Objective: Vitals:   02/11/23 1225 02/11/23 1230 02/11/23 1245 02/11/23 1304  BP: (!) 150/83 (!) 146/68 (!) 144/60 (!) 148/73  Pulse: 72 69 66 68  Resp: 19 13 14 14   Temp: 97.7 F (36.5 C)  97.7 F (36.5 C)   TempSrc:      SpO2: 99% 94% 93% 96%  Weight:        Intake/Output Summary (Last 24 hours) at 02/11/2023 1444 Last data filed at 02/11/2023 1154 Gross per 24 hour  Intake 740 ml  Output 355 ml  Net 385 ml    Filed Weights   02/07/23 0430 02/08/23 0500 02/09/23 0500  Weight: 48.3 kg 48.3 kg 49.3 kg    Examination:  General exam: Appears comfortable, deconditioned, not in any acute distress. Respiratory system: CTA bilaterally.  Respiratory effort normal. RR 13 Cardiovascular system: S1 & S2 heard, RRR. No Murmer, No pedal edema. Gastrointestinal system: Abdomen is non distended, soft and non tender. Normal bowel sounds heard. Central nervous system: Alert and oriented x 3. No focal neurological deficits. Extremities: No edema, No cyanosis, No clubbing Skin: No rashes, lesions or ulcers Psychiatry: Judgement and insight appear normal. Mood & affect appropriate.     Data Reviewed: I have personally reviewed following labs and imaging studies  CBC: Recent Labs  Lab 02/06/23 0254 02/07/23 0352 02/08/23 0841 02/11/23 0546  WBC 8.1 11.3* 7.6 9.1  NEUTROABS 6.5  --   --   --   HGB 13.6 14.1 13.8 12.4  HCT 44.0 44.4 42.6 40.6  MCV 84.5 83.1 82.4 83.2  PLT 444* 475* 479* 422*   Basic Metabolic Panel: Recent Labs  Lab 02/06/23 0254 02/07/23 0352 02/08/23 0841 02/11/23 0546  NA 131* 129* 129* 130*  K 3.7 4.1 3.6 4.3  CL 94* 94* 92* 94*  CO2  29 27 28 25   GLUCOSE 125* 104* 124* 116*  BUN 8 11 17 14   CREATININE 0.55 0.63 0.77 0.68  CALCIUM 8.5* 8.6* 8.6* 8.6*  MG  --  2.1 2.0 1.9  PHOS  --  3.0 2.8 3.9   GFR: Estimated Creatinine Clearance: 35.6 mL/min (by C-G formula based on SCr of 0.68 mg/dL). Liver Function Tests: No results for input(s): "AST", "ALT", "ALKPHOS", "BILITOT", "PROT", "ALBUMIN" in the last 168 hours. No results for input(s): "LIPASE", "AMYLASE" in the last 168 hours. No results for input(s): "AMMONIA" in the last 168 hours. Coagulation Profile: No results for input(s): "INR", "PROTIME" in the last 168 hours.  Cardiac Enzymes: No results for input(s): "CKTOTAL", "CKMB", "CKMBINDEX", "TROPONINI" in the last 168 hours. BNP (last 3 results) No results for input(s): "PROBNP" in the last 8760 hours. HbA1C: No results for input(s): "HGBA1C" in the last 72 hours. CBG: Recent Labs  Lab 02/08/23 1111  GLUCAP 109*   Lipid Profile: No results for input(s): "CHOL", "HDL", "LDLCALC",  "TRIG", "CHOLHDL", "LDLDIRECT" in the last 72 hours. Thyroid Function Tests: No results for input(s): "TSH", "T4TOTAL", "FREET4", "T3FREE", "THYROIDAB" in the last 72 hours. Anemia Panel: No results for input(s): "VITAMINB12", "FOLATE", "FERRITIN", "TIBC", "IRON", "RETICCTPCT" in the last 72 hours. Sepsis Labs: No results for input(s): "PROCALCITON", "LATICACIDVEN" in the last 168 hours.  Recent Results (from the past 240 hour(s))  Surgical pcr screen     Status: None   Collection Time: 02/11/23  7:54 AM   Specimen: Nasal Mucosa; Nasal Swab  Result Value Ref Range Status   MRSA, PCR NEGATIVE NEGATIVE Final   Staphylococcus aureus NEGATIVE NEGATIVE Final    Comment: (NOTE) The Xpert SA Assay (FDA approved for NASAL specimens in patients 69 years of age and older), is one component of a comprehensive surveillance program. It is not intended to diagnose infection nor to guide or monitor treatment. Performed at [facility], [address] [place], [place] [address]      Radiology Studies: No results found.  Scheduled Meds:  chlorhexidine       docusate sodium  100 mg Oral BID   Gerhardt's butt cream   Topical BID   lidocaine  1 patch Transdermal Q24H   LORazepam  0.5 mg Oral BID   pantoprazole  40 mg Oral BID   rosuvastatin  20 mg Oral QHS   sertraline  25 mg Oral QHS   Continuous Infusions:  sodium chloride 50 mL/hr at 02/11/23 1310     LOS: 5 days    Time spent: 35 mins    Willeen Niece, MD [facility]   If 7PM-7AM, please contact night-coverage

## 2023-02-11 NOTE — Plan of Care (Signed)
  Problem: Education: Goal: Understanding of cardiac disease, CV risk reduction, and recovery process will improve Outcome: Progressing   Problem: Activity: Goal: Ability to tolerate increased activity will improve Outcome: Progressing   Problem: Activity: Goal: Ability to return to baseline activity level will improve Outcome: Progressing

## 2023-02-11 NOTE — Sedation Documentation (Signed)
Care per anesthesia at this time. Alwyn Ren, CRNA at bedside. See CRNA documentation.

## 2023-02-11 NOTE — Anesthesia Preprocedure Evaluation (Addendum)
Anesthesia Evaluation  Patient identified by MRN, date of birth, ID bandGeneral Assessment Comment: Awake, alert but with some confusion   Reviewed: Allergy & Precautions, NPO status , Patient's Chart, lab work & pertinent test results  History of Anesthesia Complications Negative for: history of anesthetic complications  Airway Mallampati: II  TM Distance: >3 FB Neck ROM: Full    Dental  (+) Edentulous Upper, Edentulous Lower   Pulmonary sleep apnea , Current Smoker and Patient abstained from smoking.   Pulmonary exam normal        Cardiovascular hypertension, + CAD, + Past MI, + Cardiac Stents, +CHF and + DVT  Normal cardiovascular exam+ dysrhythmias Atrial Fibrillation    Hx Takotsubo CM  '24 TTE - No sig LVOT obstruction. Basal/septal hypertrophy can be seen with old  age. Unchanged hypokinesis of the inferior/inferolateral/inferoseptal base-mid wall. EF 50 to 55%. There is severe asymmetric left ventricular hypertrophy of the basal-septal segment. Grade I diastolic dysfunction (impaired relaxation). Mild aortic valve stenosis.     Neuro/Psych  Headaches PSYCHIATRIC DISORDERS Anxiety Depression     Hx SDH  TIACVA    GI/Hepatic Neg liver ROS, hiatal hernia,GERD  Medicated and Controlled,,  Endo/Other   Na 130 (Chronic)   Renal/GU negative Renal ROS     Musculoskeletal  (+) Arthritis , Osteoarthritis,    Abdominal   Peds  Hematology  On plavix and eliquis     Anesthesia Other Findings Raynaud's phenomena   Reproductive/Obstetrics                             Anesthesia Physical Anesthesia Plan  ASA: 3  Anesthesia Plan: General   Post-op Pain Management: Tylenol PO (pre-op)*   Induction: Intravenous  PONV Risk Score and Plan: 2 and Treatment may vary due to age or medical condition, Ondansetron and TIVA  Airway Management Planned: Oral ETT  Additional Equipment:  None  Intra-op Plan:   Post-operative Plan: Extubation in OR  Informed Consent: I have reviewed the patients History and Physical, chart, labs and discussed the procedure including the risks, benefits and alternatives for the proposed anesthesia with the patient or authorized representative who has indicated his/her understanding and acceptance.   Patient has DNR.  Continue DNR and Discussed DNR with power of attorney.   Dental advisory given  Plan Discussed with: CRNA and Anesthesiologist  Anesthesia Plan Comments: (Agreeable to intubation as indicated by the procedure )       Anesthesia Quick Evaluation

## 2023-02-11 NOTE — Anesthesia Postprocedure Evaluation (Signed)
Anesthesia Post Note  Patient: [name]  Procedure(s) Performed: T2, T3, T5 Kyphoplasty     Patient location during evaluation: PACU Anesthesia Type: General Level of consciousness: awake and confused (as in preop) Pain management: pain level controlled Vital Signs Assessment: post-procedure vital signs reviewed and stable Respiratory status: spontaneous breathing, nonlabored ventilation, respiratory function stable and patient connected to nasal cannula oxygen Cardiovascular status: blood pressure returned to baseline and stable Postop Assessment: no apparent nausea or vomiting Anesthetic complications: no   No notable events documented.  Last Vitals:  Vitals:   02/11/23 1245 02/11/23 1304  BP: (!) 144/60 (!) 148/73  Pulse: 66 68  Resp: 14 14  Temp: 36.5 C   SpO2: 93% 96%    Last Pain:  Vitals:   02/11/23 1225  TempSrc:   PainSc: Asleep                 Beryle Lathe

## 2023-02-11 NOTE — Procedures (Signed)
INR. S/P T 5 balloon KP .  RT transpedicular approach. No acute complications. Patient tolerated the procedure well. Extubated. Still drowsy. Opens eyes to command and  able to bend  her knees .  S.Verneice Caspers MD

## 2023-02-11 NOTE — Discharge Instructions (Signed)
INR.  1.  No stooping, bending or lifting weights above 10pounds for 2 weeks.  2.  Use a walker to ambulate for 2 weeks..  3.  Follow-up with referring MD in 2 weeks.  Fatima Sanger MD.

## 2023-02-11 NOTE — H&P (Addendum)
Patient Status: [name] - In-pt  Assessment and Plan: T2, T3, T5 compression fractures Patient with acute T2, T3, T5 compression fractures.  Request made for vertebroplasty/kyphoplasty.  Case reviewed and approved by Dr. Corliss Skains. Procedure discussed at length with patient and her daughter over the past several days.  They would like to proceed as able.  All aware that based on level of fractures and bone quality that procedure may or may not be successful. They are aware of the risks and benefits and are agreeable to proceed with general anesthesia.   Spoke with patient's daughter who confirms patient is a limited DNR outside of procedure-- no chest compressions or intubation for cardiac arrest or respiratory arrest, but would accept medications.  DURING the procedure patient is a DNR and she should not be resusitated.   Risks and benefits were discussed with the patient including, but not limited to education regarding the natural healing process of compression fractures without intervention, bleeding, infection, cement migration which may cause spinal cord damage, paralysis, pulmonary embolism or even death.  This interventional procedure involves the use of X-rays and because of the nature of the planned procedure, it is possible that we will have prolonged use of X-ray fluoroscopy.  Potential radiation risks to you include (but are not limited to) the following: - A slightly elevated risk for cancer  several years later in life. This risk is typically less than 0.5% percent. This risk is low in comparison to the normal incidence of human cancer, which is 33% for women and 50% for men according to the [employer] Society. - Radiation induced injury can include skin redness, resembling a rash, tissue breakdown / ulcers and hair loss (which can be temporary or permanent).   The likelihood of either of these occurring depends on the difficulty of the procedure and whether you are  sensitive to radiation due to previous procedures, disease, or genetic conditions.   IF your procedure requires a prolonged use of radiation, you will be notified and given written instructions for further action.  It is your responsibility to monitor the irradiated area for the 2 weeks following the procedure and to notify your physician if you are concerned that you have suffered a radiation induced injury.    All of the patient's questions were answered, patient is agreeable to proceed.  Consent signed and in chart.  ______________________________________________________________________   History of Present Illness: [name] is a 89 y.o. female with new T2, T3, and T5 compression fractures.  IR consutled for vertebroplasty/kyphoplasty. Case reviewed and approved by Dr. Corliss Skains.  Discussed at length with patient and her family.  Due to severe, limiting ongoing pain they would like to proceed as able. Insurance approval obtained.   Allergies and medications reviewed.   Review of Systems: A 12 point ROS discussed and pertinent positives are indicated in the HPI above.  All other systems are negative.  Review of Systems  Constitutional:  Negative for fatigue and fever.  Respiratory:  Negative for cough and shortness of breath.   Cardiovascular:  Negative for chest pain.  Gastrointestinal:  Negative for abdominal pain, nausea and vomiting.  Musculoskeletal:  Negative for back pain.  Psychiatric/Behavioral:  Negative for behavioral problems and confusion.     Vital Signs: BP 125/67   Pulse 80   Temp 98.6 F (37 C) (Oral)   Resp 16   Wt 108 lb 11 oz (49.3 kg)   SpO2 97%   BMI 18.09 kg/m  Physical Exam Vitals and nursing note reviewed.  Constitutional:      General: She is not in acute distress.    Appearance: Normal appearance. She is not ill-appearing.  HENT:     Mouth/Throat:     Mouth: Mucous membranes are moist.     Pharynx: Oropharynx is clear.   Cardiovascular:     Rate and Rhythm: Normal rate and regular rhythm.  Pulmonary:     Effort: Pulmonary effort is normal.     Breath sounds: Normal breath sounds.  Abdominal:     General: Abdomen is flat.     Palpations: Abdomen is soft.  Musculoskeletal:     Comments: Able to move legs spontaneously.  Follows commands.  Attempts to raise arms and is able to achieve against gravity but with severe pain in her back, chest, and underarms.   Skin:    General: Skin is warm and dry.  Neurological:     General: No focal deficit present.     Mental Status: She is alert and oriented to person, place, and time. Mental status is at baseline.  Psychiatric:        Mood and Affect: Mood normal.        Behavior: Behavior normal.        Thought Content: Thought content normal.      Imaging reviewed.   Labs:  COAGS: Recent Labs    02/23/22 1507 09/28/22 2256 01/23/23 2121 02/03/23 0255  INR 1.1 1.4* 1.1 1.1  APTT 34  --   --   --     BMP: Recent Labs    02/06/23 0254 02/07/23 0352 02/08/23 0841 02/11/23 0546  NA 131* 129* 129* 130*  K 3.7 4.1 3.6 4.3  CL 94* 94* 92* 94*  CO2 29 27 28 25   GLUCOSE 125* 104* 124* 116*  BUN 8 11 17 14   CALCIUM 8.5* 8.6* 8.6* 8.6*  CREATININE 0.55 0.63 0.77 0.68  GFRNONAA >60 >60 >60 >60       Electronically Signed: Hoyt Koch, PA 02/11/2023, 9:59 AM   I spent a total of 15 minutes in face to face in clinical consultation, greater than 50% of which was counseling/coordinating care for T2, T3, T5 compression fracture.

## 2023-02-12 DIAGNOSIS — S22020D Wedge compression fracture of second thoracic vertebra, subsequent encounter for fracture with routine healing: Secondary | ICD-10-CM | POA: Diagnosis not present

## 2023-02-12 MED ORDER — HYDRALAZINE HCL 50 MG PO TABS: 50.0000 mg | ORAL_TABLET | Freq: Three times a day (TID) | ORAL | 0 refills | Status: AC | PRN

## 2023-02-12 MED ORDER — OXYCODONE HCL 5 MG PO TABS: 5.0000 mg | ORAL_TABLET | ORAL | 0 refills | Status: DC | PRN

## 2023-02-12 MED ORDER — ENSURE ENLIVE PO LIQD
237.0000 mL | ORAL | Status: DC
  Administered 2023-02-12: 237 mL via ORAL

## 2023-02-12 MED ORDER — ACETAMINOPHEN 325 MG PO TABS
650.0000 mg | ORAL_TABLET | Freq: Four times a day (QID) | ORAL | Status: DC | PRN
  Administered 2023-02-12 (×2): 650 mg via ORAL
  Filled 2023-02-12: qty 2

## 2023-02-12 MED ORDER — ACETAMINOPHEN 325 MG PO TABS
ORAL_TABLET | ORAL | Status: AC
  Filled 2023-02-12: qty 2

## 2023-02-12 NOTE — Progress Notes (Signed)
Physical Therapy Treatment Patient Details Name: [name] MRN: [id number] DOB: 08-Nov-1929 Today's Date: 02/12/2023   History of Present Illness Pt is 89 yo presenting to [facility] ED 02/03/23 from [facility] rehab due to unwitnessed fall. Imaging revealed subacute compression fractures involving the C7, T2, T3, T5, and  T7 vertebral bodies.  She underwent kyphoplasty T2, T3, and T5 02/11/23. PMHx:  osteoporosis,T9,10,11, L1 and L2 compression fxs w/ Kyphoplasty, L hip fracture, OA, CVA, CAD, CA, HTN    PT Comments  Pt underwent kyphoplasty T2, T3, T5 yesterday 9/30. Pt OOB for first time during this PT session. Pt with c/o pain in chest and back (L>R). She required mod assist bed mobility, sit to stand, and SPT. Increased time to complete all functional mobility due to pain. Pt in recliner with feet elevated at end of session. In recliner: BP 177/86, SpO2 95% on RA, HR 91. Current POC remains appropriate.    If plan is discharge home, recommend the following: Two people to help with walking and/or transfers;Assistance with cooking/housework;Assist for transportation   Can travel by private vehicle     No  Equipment Recommendations  Other (comment) (defer to post acute)    Recommendations for Other Services       Precautions / Restrictions Precautions Precautions: Fall;Back;Other (comment) Precaution Comments: s/p kyphoplasty 9/30, foley     Mobility  Bed Mobility Overal bed mobility: Needs Assistance Bed Mobility: Rolling, Sidelying to Sit Rolling: Mod assist, Used rails Sidelying to sit: Mod assist, Used rails       General bed mobility comments: increased time due to pain, cues for sequencing, assist with trunk and BLE    Transfers Overall transfer level: Needs assistance Equipment used: 1 person hand held assist Transfers: Sit to/from Stand, Bed to chair/wheelchair/BSC Sit to Stand: Mod assist Stand pivot transfers: Mod assist         General transfer comment:  assist to power up, increased time due to pain, therapist anterior to pt providing BUE support on forearms. Small pivot steps bed to recliner.    Ambulation/Gait               General Gait Details: deferred due to pain, first time OOB since kyphoplasty   Stairs             Wheelchair Mobility     Tilt Bed    Modified Rankin (Stroke Patients Only)       Balance Overall balance assessment: Needs assistance Sitting-balance support: Bilateral upper extremity supported, Feet unsupported Sitting balance-Leahy Scale: Fair     Standing balance support: Bilateral upper extremity supported, During functional activity Standing balance-Leahy Scale: Poor Standing balance comment: reliant on external support                            Cognition Arousal: Alert Behavior During Therapy: WFL for tasks assessed/performed Overall Cognitive Status: Impaired/Different from baseline Area of Impairment: Orientation, Problem solving, Awareness, Memory, Attention                 Orientation Level: Disoriented to, Place, Time Current Attention Level: Sustained Memory: Decreased short-term memory     Awareness: Intellectual Problem Solving: Slow processing, Difficulty sequencing, Requires verbal cues          Exercises      General Comments General comments (skin integrity, edema, etc.): In recliner:  BP 177/86, SpO2 95% on RA, HR 91      Pertinent  Vitals/Pain Pain Assessment Pain Assessment: Faces Faces Pain Scale: Hurts even more Pain Location: chest, back Pain Descriptors / Indicators: Discomfort, Grimacing, Tender, Guarding Pain Intervention(s): Limited activity within patient's tolerance, Repositioned, Monitored during session    Home Living                          Prior Function            PT Goals (current goals can now be found in the care plan section) Acute Rehab PT Goals Patient Stated Goal: to move Progress towards PT  goals: Progressing toward goals    Frequency    Min 1X/week      PT Plan      Co-evaluation              AM-PAC PT "6 Clicks" Mobility   Outcome Measure  Help needed turning from your back to your side while in a flat bed without using bedrails?: A Lot Help needed moving from lying on your back to sitting on the side of a flat bed without using bedrails?: A Lot Help needed moving to and from a bed to a chair (including a wheelchair)?: A Lot Help needed standing up from a chair using your arms (e.g., wheelchair or bedside chair)?: A Lot Help needed to walk in hospital room?: A Lot Help needed climbing 3-5 steps with a railing? : Total 6 Click Score: 11    End of Session Equipment Utilized During Treatment: Gait belt Activity Tolerance: Patient tolerated treatment well;Patient limited by pain Patient left: in chair;with call bell/phone within reach;with chair alarm set Nurse Communication: Mobility status PT Visit Diagnosis: Other abnormalities of gait and mobility (R26.89);Pain     Time: 1610-9604 PT Time Calculation (min) (ACUTE ONLY): 19 min  Charges:    $Therapeutic Activity: 8-22 mins PT General Charges $$ ACUTE PT VISIT: 1 Visit                     Ferd Glassing., PT  Office # [phone]    Ilda Foil 02/12/2023, 8:22 AM

## 2023-02-12 NOTE — TOC Progression Note (Addendum)
Transition of Care [facility]) - Progression Note    Patient Details  Name: [name] MRN: [id number] Date of Birth: 1929/09/12  [facility] of [facility]) CM/SW Contact  Lorri Frederick, LCSW Phone Number: 02/12/2023, 10:20 AM  Clinical Narrative:   SNF auth request submitted in [place].   1120: SNF auth approved: [id number], 3 days: 10/1-10/3.  Expected Discharge Plan: Long Term Nursing Home Barriers to Discharge: Continued Medical Work up  Expected Discharge Plan and Services In-house Referral: Clinical Social Work   Post Acute Care Choice: Skilled Nursing Facility Living arrangements for the past 2 months: Skilled Nursing Facility ([facility]-long term care)                                       Social Determinants of Health (SDOH) Interventions SDOH Screenings   Food Insecurity: No Food Insecurity (02/03/2023)  Housing: Low Risk  (02/03/2023)  Transportation Needs: No Transportation Needs (02/03/2023)  Utilities: Not At Risk (02/03/2023)  Depression (PHQ2-9): Low Risk  (12/19/2021)  Financial Resource Strain: Low Risk  (08/10/2021)  Social Connections: Unknown (10/03/2021)   Received from [facility]  Tobacco Use: High Risk (02/03/2023)    Readmission Risk Interventions     No data to display

## 2023-02-12 NOTE — Consult Note (Signed)
Value-Based Care Institute   [facility] Inpatient Consult   02/12/2023  [name] 1929-11-03 [id number]  [facility] [[facility]]  Accountable Care Organization [ACO] Patient: [name]  Primary Care Provider:  Rehab, [facility]  provider for facility   Patient was reviewed for high risk score 9 day length of stay for post hospital barriers to care.  Patient was screened for hospitalization and on behalf of [facility] /[facility] Care Coordination to assess for post hospital community care needs.  Patient is long term care at [facility] skilled nursing facility level of care for post hospital transition.  Post hospital needs are to be met at the LTC level of care. No community care coordination needed at this time.   Plan:   Will sign off.  For questions or referrals, please contact:   Charlesetta Shanks, RN BSN CCM [facility]  [phone] business mobile phone Toll free office [phone]  Fax number: [phone] [email or web address]

## 2023-02-12 NOTE — Plan of Care (Signed)
  Problem: Education: Goal: Understanding of cardiac disease, CV risk reduction, and recovery process will improve Outcome: Progressing Goal: Individualized Educational Video(s) Outcome: Progressing   Problem: Cardiac: Goal: Ability to achieve and maintain adequate cardiovascular perfusion will improve Outcome: Progressing   Problem: Health Behavior/Discharge Planning: Goal: Ability to safely manage health-related needs after discharge will improve Outcome: Progressing   Problem: Education: Goal: Understanding of CV disease, CV risk reduction, and recovery process will improve Outcome: Progressing Goal: Individualized Educational Video(s) Outcome: Progressing   Problem: Activity: Goal: Ability to return to baseline activity level will improve Outcome: Progressing   Problem: Cardiovascular: Goal: Ability to achieve and maintain adequate cardiovascular perfusion will improve Outcome: Progressing Goal: Vascular access site(s) Level 0-1 will be maintained Outcome: Progressing   Problem: Health Behavior/Discharge Planning: Goal: Ability to safely manage health-related needs after discharge will improve Outcome: Progressing   Problem: Education: Goal: Verbalization of understanding the information provided (i.e., activity precautions, restrictions, etc) will improve Outcome: Progressing Goal: Individualized Educational Video(s) Outcome: Progressing   Problem: Activity: Goal: Ability to ambulate and perform ADLs will improve Outcome: Progressing   Problem: Clinical Measurements: Goal: Postoperative complications will be avoided or minimized Outcome: Progressing   Problem: Self-Concept: Goal: Ability to maintain and perform role responsibilities to the fullest extent possible will improve Outcome: Progressing   Problem: Pain Management: Goal: Pain level will decrease Outcome: Progressing   Problem: Education: Goal: Knowledge of cardiac device and self-care will  improve Outcome: Progressing Goal: Ability to safely manage health related needs after discharge will improve Outcome: Progressing Goal: Individualized Educational Video(s) Outcome: Progressing   Problem: Cardiac: Goal: Ability to achieve and maintain adequate cardiopulmonary perfusion will improve Outcome: Progressing   Problem: Education: Goal: Knowledge of General Education information will improve Description: Including pain rating scale, medication(s)/side effects and non-pharmacologic comfort measures Outcome: Progressing   Problem: Health Behavior/Discharge Planning: Goal: Ability to manage health-related needs will improve Outcome: Progressing   Problem: Clinical Measurements: Goal: Ability to maintain clinical measurements within normal limits will improve Outcome: Progressing Goal: Will remain free from infection Outcome: Progressing Goal: Diagnostic test results will improve Outcome: Progressing Goal: Respiratory complications will improve Outcome: Progressing Goal: Cardiovascular complication will be avoided Outcome: Progressing   Problem: Activity: Goal: Risk for activity intolerance will decrease Outcome: Progressing   Problem: Nutrition: Goal: Adequate nutrition will be maintained Outcome: Progressing   Problem: Coping: Goal: Level of anxiety will decrease Outcome: Progressing   Problem: Elimination: Goal: Will not experience complications related to bowel motility Outcome: Progressing Goal: Will not experience complications related to urinary retention Outcome: Progressing   Problem: Pain Managment: Goal: General experience of comfort will improve Outcome: Progressing   Problem: Safety: Goal: Ability to remain free from injury will improve Outcome: Progressing   Problem: Skin Integrity: Goal: Risk for impaired skin integrity will decrease Outcome: Progressing

## 2023-02-12 NOTE — TOC Progression Note (Signed)
Transition of Care [facility]) - Progression Note    Patient Details  Name: [name] MRN: [id number] Date of Birth: 1929-07-04  Transition [facility]) CM/SW Contact  Lorri Frederick, LCSW Phone Number: 02/12/2023, 10:19 AM  Clinical Narrative:   CSW spoke with pt daughter [name] about potential DC tomorrow.     Expected Discharge Plan: Long Term Nursing Home Barriers to Discharge: Continued Medical Work up  Expected Discharge Plan and Services In-house Referral: Clinical Social Work   Post Acute Care Choice: Skilled Nursing Facility Living arrangements for the past 2 months: Skilled Nursing Facility ([facility]-long term care)                                       Social Determinants of Health (SDOH) Interventions SDOH Screenings   Food Insecurity: No Food Insecurity (02/03/2023)  Housing: Low Risk  (02/03/2023)  Transportation Needs: No Transportation Needs (02/03/2023)  Utilities: Not At Risk (02/03/2023)  Depression (PHQ2-9): Low Risk  (12/19/2021)  Financial Resource Strain: Low Risk  (08/10/2021)  Social Connections: Unknown (10/03/2021)   Received from [facility]  Tobacco Use: High Risk (02/03/2023)    Readmission Risk Interventions     No data to display

## 2023-02-12 NOTE — Progress Notes (Signed)
Initial Nutrition Assessment  DOCUMENTATION CODES:   Underweight, Severe malnutrition in context of chronic illness  INTERVENTION:  Cotninue dysphagia 3 diet for ease of chewing Ensure Enlive po once daily, each supplement provides 350 kcal and 20 grams of protein. Magic cup TID with meals, each supplement provides 290 kcal and 9 grams of protein  NUTRITION DIAGNOSIS:   Severe Malnutrition related to chronic illness (CAD, CHF, CVA, chronic respiratory failure) as evidenced by severe fat depletion, severe muscle depletion.  GOAL:   Patient will meet greater than or equal to 90% of their needs  MONITOR:   PO intake  REASON FOR ASSESSMENT:   Malnutrition Screening Tool    ASSESSMENT:   Pt admitted after a fall leading to compression fracture of T2 vertebra. PMH significant for CAD, diastolic CHF, CVA, GERD, polycythemia, breast cancer s/p L lumpectomy, PAF on eliquis, depression, anxiety, frequent falls, DVT, PE, chronic respiratory failure.   9/30: s/p kyphoplasty  Pt resting soundly at time of visit. Awoke briefly and agreed to nutrition focused physical exam, then went back to sleep. Unable to obtain detailed nutrition related history at this time.   Meal completions: 9/28: 80% breakfast, 70% lunch, 75% dinner 9/29: 50% breakfast, 50% lunch, 75% dinner 9/30: NPO 10/1: 75% breakfast  Per review of documented weight history, it appears pt's weight has declined 8.6% over the last 8 months which is not clinically significant for time frame.   Admission weight has remained between 47-50 kg.   Edema: non-pitting BUE/BLE  Medications: colace, protonix  Labs: sodium 130   NUTRITION - FOCUSED PHYSICAL EXAM:  Flowsheet Row Most Recent Value  Orbital Region Severe depletion  Upper Arm Region Severe depletion  Thoracic and Lumbar Region Moderate depletion  Buccal Region Severe depletion  Temple Region Severe depletion  Clavicle Bone Region Severe depletion  Clavicle  and Acromion Bone Region Severe depletion  Scapular Bone Region Severe depletion  Dorsal Hand Severe depletion  Patellar Region Severe depletion  Anterior Thigh Region Severe depletion  Posterior Calf Region Severe depletion  Edema (RD Assessment) None  Hair Reviewed  Eyes Unable to assess  Mouth Unable to assess  Skin Reviewed  Nails Reviewed       Diet Order:   Diet Order             Diet - low sodium heart healthy           Diet Carb Modified           DIET DYS 3 Room service appropriate? Yes; Fluid consistency: Thin  Diet effective now                   EDUCATION NEEDS:   No education needs have been identified at this time  Skin:  Skin Assessment: Reviewed RN Assessment (closed incision vertebral column)  Last BM:  10/1 (type 6 x2 small)  Height:   Ht Readings from Last 1 Encounters:  01/23/23 5\' 5"  (1.651 m)    Weight:   Wt Readings from Last 1 Encounters:  02/12/23 49.7 kg   BMI:  Body mass index is 18.23 kg/m.  Estimated Nutritional Needs:   Kcal:  1300-1500  Protein:  65-80g  Fluid:  1.3-1.5L  Drusilla Kanner, RDN, LDN Clinical Nutrition

## 2023-02-12 NOTE — Discharge Summary (Signed)
Physician Discharge Summary  RHEANN BOEHNLEIN HKV:[id number] DOB: 07-21-1929 DOA: 02/03/2023  PCP: [facility]  Admit date: 02/03/2023  Discharge date: 02/13/2023  Admitted From: SNF  Disposition:  SNF [facility]  Recommendations for Outpatient Follow-up:  Follow up with PCP in 1-2 weeks. Please obtain BMP/CBC in one week. Patient is status post kyphoplasty for T2 compression fracture.   Patient being discharged back to skilled nursing facility for rehab  Home Health: None Equipment/Devices:None  Discharge Condition: Stable CODE STATUS: DNR Diet recommendation: Heart Healthy   Brief [facility] Hospital Course: This 89 yrs old female with medical history significant of CAD, diastolic CHF, hx of CVA, GERD, polycythemia,  hx of breast cancer s/p left lumpectomy, PAF on eliquis, depression and anxiety, frequent falls, hx of DVT and PE, chronic respiratory failure who presented to ED after a fall. She states she was walking from kitchen to the front door and guessed she lost her balance and fell onto her left side. She was admitted to [facility] for pain control and evaluation by IR to see if she is a candidate for kyphoplasty.  Neurosurgery consulted recommended outpatient follow, no neurosurgical intervention needed.  She underwent successful kyphoplasty , tolerated well.  Continue adequate pain control.  Patient feels much better and wants to be discharged.  Patient being discharged to skilled nursing facility for rehab.   Discharge Diagnoses:  Principal Problem:   Compression fracture of T2 vertebra (HCC) Active Problems:   Hypokalemia   Leukocytosis   Chronic respiratory failure with hypoxia (HCC)   Coronary artery disease involving native coronary artery of native heart with unstable angina pectoris (HCC)   Chronic diastolic CHF (congestive heart failure) (HCC)   Polycythemia   Paroxysmal atrial fibrillation (HCC)   Essential hypertension   GERD (gastroesophageal reflux  disease)   Depression with anxiety  Compression fracture of T2 vertebra [facility]): CT showing acute T2 compression fracture with approximately 40% vertebral height loss and 3mm retropulsion of the posterior superior cortex.  Neurosurgery consulted. Recommended follow up outpatient. No acute surgical intervention per ED provider -Continue pain control with scheduled tylenol 1000mg  TID x 3 days -Continue oxycodone 5mg  q8 hour prn for moderate to severe pain. -lidocaine patch - IR consulted to see if she is a candidate for kyphoplasty.   Eliquis is to be held for minimum 5 days.   -Cardiology recommended discontinue Eliquis indefinitely and recommended Plavix monotherapy , but can be stopped at this time and resume post kyphoplasty.- -PT/OT Consulted and recommended SNF.  -Patient underwent successful kyphoplasty, tolerated well. -Plavix resumed at discharge.  Eliquis is discontinued indefinitely due to multiple risks.   Hypokalemia: Replaced. Resolved.   Leukocytosis: > Resolved. UA is negative.  CXR does not show any infection.  Continue to monitor.   Chronic respiratory failure with hypoxia (HCC) On 2-3L home oxygen PRN.  She was discharged from hospital in 09/2022 on oxygen and was weaned. Uses PRN    Coronary artery disease involving native coronary artery of native heart with unstable angina pectoris (HCC) S/p STEMI in 02/2022 LHC  notable for 95% calcified ostial RCA lesion. Had staged atherectomy and shockwave lithotripsy with DES x1 placed.   She was initially placed on DAPT with aspirin/Brilinta but given her episode of atrial fibrillation she was placed on plavix. -- Continue plavix, Crestor 20 mg daily (no longer on metoprolol, unsure when discontinued)  -Patient denies any chest pain.  Cardiologist recommended Plavix monotherapy.   Chronic diastolic CHF (congestive heart  failure) (HCC) Appears Euvolemic.  Echo 09/2022: EF 50-55%. Grade 1 DD.  Resume lasix.     Polycythemia: Follows with oncology  Phlebotomy to maintain hematocrit below 45% Hemoglobin is around 12.    Paroxysmal atrial fibrillation (HCC) In NSR. HR controlled. Not on lopressor or amiodarone.  Cardiology discontinued Eliquis indefinitely.   Essential hypertension Suboptimally controlled. Suspect from back pain.  Continue hydralazine as needed.   GERD (gastroesophageal reflux disease) Continue daily PPI.   Depression with anxiety Continue with zoloft nightly and ativan BID    Mild hyponatremia:  Sodium of 133 > 129 Recheck labs in am.    Hyperlipidemia:  Resume crestor 20 mg daily.    Estimated body mass index is 17.35 kg/m as calculated from the following:   Height as of 01/23/23: 5\' 5"  (1.651 m).   Weight as of this encounter: 47.3 kg.  Discharge Instructions  Discharge Instructions     Call MD for:  difficulty breathing, headache or visual disturbances   Complete by: As directed    Call MD for:  persistant dizziness or light-headedness   Complete by: As directed    Call MD for:  severe uncontrolled pain   Complete by: As directed    Diet - low sodium heart healthy   Complete by: As directed    Diet Carb Modified   Complete by: As directed    Discharge instructions   Complete by: As directed    Advised to follow-up with primary care physician in 1 week. Patient is status post kyphoplasty for  T 12 compression fracture.   Patient being discharged back to skilled nursing facility for rehab   Increase activity slowly   Complete by: As directed       Allergies as of 02/13/2023       Reactions   Penicillins Hives, Rash, Other (See Comments)   Has patient had a PCN reaction causing immediate rash, facial/tongue/throat swelling, SOB or lightheadedness with hypotension: Yes Has patient had a PCN reaction causing severe rash involving mucus membranes or skin necrosis: yes Has patient had a PCN reaction that required hospitalization: No Has patient had a  PCN reaction occurring within the last 10 years: no If all of the above answers are "NO", then may proceed with Cephalosporin use. Other Reaction: OTHER REACTION   Valsartan Itching   Atorvastatin Diarrhea   Carvedilol Other (See Comments)   "teeth hurt when I took it"   Ezetimibe Nausea Only   Fluvastatin Sodium Other (See Comments)   "Can't remember"   Magnesium Hydroxide Other (See Comments)   REACTION: triggers HAs   Meloxicam Other (See Comments)   Pt seeing auro's / spots.     Pneumovax [pneumococcal Polysaccharide Vaccine] Other (See Comments)   Local reaction   Quinapril Hcl Other (See Comments)    "feelings of tiredness"   Simvastatin Diarrhea   Topamax [topiramate] Itching   Vit D-vit E-safflower Oil Other (See Comments)   Headaches        Medication List     STOP taking these medications    apixaban 2.5 MG Tabs tablet Commonly known as: ELIQUIS   docusate sodium 100 MG capsule Commonly known as: COLACE   HYDROcodone-acetaminophen 5-325 MG tablet Commonly known as: NORCO/VICODIN       TAKE these medications    acetaminophen 500 MG tablet Commonly known as: TYLENOL Take 1,000 mg by mouth every 8 (eight) hours as needed (for pain).   alendronate 70 MG tablet Commonly known as:  FOSAMAX Take 1 tablet (70 mg total) by mouth once a week. Take with a full glass of water on an empty stomach.   calcium carbonate 500 MG chewable tablet Commonly known as: TUMS - dosed in mg elemental calcium Chew 1 tablet (200 mg of elemental calcium total) by mouth 3 (three) times daily as needed for indigestion or heartburn.   clopidogrel 75 MG tablet Commonly known as: PLAVIX Take 1 tablet (75 mg total) by mouth daily.   feeding supplement Liqd Take 237 mLs by mouth daily.   furosemide 20 MG tablet Commonly known as: LASIX Take 20 mg by mouth daily.   hydrALAZINE 50 MG tablet Commonly known as: APRESOLINE Take 1 tablet (50 mg total) by mouth every 8 (eight)  hours as needed (BP>160/90 MMHG.).   lidocaine 5 % Commonly known as: LIDODERM Place 1 patch onto the skin daily. Remove & Discard patch within 12 hours or as directed by MD What changed: additional instructions   LORazepam 0.5 MG tablet Commonly known as: ATIVAN Take 0.25 mg by mouth 2 (two) times daily.   nitroGLYCERIN 0.4 MG SL tablet Commonly known as: NITROSTAT Place 1 tablet (0.4 mg total) under the tongue every 5 (five) minutes x 3 doses as needed for chest pain.   ondansetron 4 MG disintegrating tablet Commonly known as: ZOFRAN-ODT Take 4 mg by mouth every 8 (eight) hours as needed for nausea or vomiting.   oxyCODONE 5 MG immediate release tablet Commonly known as: Oxy IR/ROXICODONE Take 1 tablet (5 mg total) by mouth every 4 (four) hours as needed for up to 3 days for moderate pain or severe pain.   pantoprazole 40 MG tablet Commonly known as: PROTONIX Take 1 tablet (40 mg total) by mouth at bedtime. What changed: when to take this   polyethylene glycol 17 g packet Commonly known as: MiraLax Take 17 g by mouth daily.   rosuvastatin 20 MG tablet Commonly known as: CRESTOR Take 1 tablet (20 mg total) by mouth daily. What changed: when to take this   senna 8.6 MG Tabs tablet Commonly known as: SENOKOT Take 1 tablet (8.6 mg total) by mouth 2 (two) times daily.   sertraline 25 MG tablet Commonly known as: ZOLOFT Take 25 mg by mouth at bedtime.   UNABLE TO FIND Take 120 mLs by mouth in the morning, at noon, and at bedtime. Med Name: Med pass   UNABLE TO FIND Take 1 Dose by mouth 2 (two) times daily with a meal. Med Name: Magic Cup        Follow-up Information     [facility] Follow up.   Specialty: Cardiology Why: Hospital follow-up with Cardiology scheduled for 02/21/2023 at 1:55pm with Wallis Bamberg, NP, at our [facility] office. Please arrive 15 miniutes early for check-in. If this date/ time does not work for you, please call our  offfice to reschedule. Contact information: [address] [place] [address] [phone]        Rehab, [facility] Follow up in 1 week(s).   Specialty: Skilled Nursing Facility Contact information: [address] [place] [address] [phone]                Allergies  Allergen Reactions   Penicillins Hives, Rash and Other (See Comments)    Has patient had a PCN reaction causing immediate rash, facial/tongue/throat swelling, SOB or lightheadedness with hypotension: Yes Has patient had a PCN reaction causing severe rash involving mucus  membranes or skin necrosis: yes Has patient had a PCN reaction that required hospitalization: No Has patient had a PCN reaction occurring within the last 10 years: no If all of the above answers are "NO", then may proceed with Cephalosporin use.  Other Reaction: OTHER REACTION   Valsartan Itching   Atorvastatin Diarrhea   Carvedilol Other (See Comments)    "teeth hurt when I took it"    Ezetimibe Nausea Only   Fluvastatin Sodium Other (See Comments)    "Can't remember"   Magnesium Hydroxide Other (See Comments)    REACTION: triggers HAs   Meloxicam Other (See Comments)    Pt seeing auro's / spots.     Pneumovax [Pneumococcal Polysaccharide Vaccine] Other (See Comments)    Local reaction    Quinapril Hcl Other (See Comments)     "feelings of tiredness"    Simvastatin Diarrhea   Topamax [Topiramate] Itching   Vit D-Vit E-Safflower Oil Other (See Comments)    Headaches     Consultations: IR Ortho Cardiology   Procedures/Studies: IR KYPHO THORACIC WITH BONE BIOPSY  Result Date: 02/12/2023 INDICATION: Severe retrosternal and inter scapular pain secondary to compression fracture at T5. EXAM: BALLOON KYPHOPLASTY AT T5 COMPARISON:  MRI scan February 04, 2023. MEDICATIONS: As antibiotic prophylaxis, vancomycin 1 g IV was ordered pre-procedure and administered intravenously within 1 hour of  incision. All current medications are in the EMR and have been reviewed as part of this encounter. ANESTHESIA/SEDATION: General anesthesia. FLUOROSCOPY: Radiation Exposure Index (as provided by the fluoroscopic device): 23 minutes 12 seconds 2072 mGy Kerma COMPLICATIONS: None immediate. PROCEDURE: Following a full explanation of the procedure along with the potential associated complications, an informed witnessed consent was obtained. The patient was placed prone on the fluoroscopic table following induction of general anesthesia and intubation. The skin overlying the thoracic region was then prepped and draped in the usual sterile fashion. The right pedicle at T5 was then infiltrated with 0.25% bupivacaine followed by the advancement of an 11-gauge Jamshidi needle through the right pedicle into the posterior one-third at T5. This was then exchanged for a Kyphon advanced osteo introducer system comprised of a working cannula and a Kyphon osteo drill. This combination was then advanced over a Kyphon osteo bone pin until the tip of the Kyphon osteo drill was in the posterior third at T5. At this time, the bone pin was removed. In a medial trajectory, the combination was advanced until the tip of the working cannula was inside the posterior one-third at T5. The osteo drill was removed. Through the working cannula, a Kyphon inflatable bone tamp 15 x 3 was advanced and positioned with the distal marker 5 mm from the anterior aspect of T5. Crossing of the midline was seen on the AP projection. At this time, the balloon was expanded using contrast via a Kyphon inflation syringe device via microtubing. Inflations were continued until there was apposition with the superior and the inferior endplates. At this time, methylmethacrylate mixture was reconstituted with Tobramycin in the Kyphon bone mixing device system. This was then loaded onto the Kyphon bone fillers. The balloon was deflated and removed followed by the  instillation of 2-1/2 bone filler equivalents of methylmethacrylate mixture at T5 with excellent filling in the AP and lateral projections. No extravasation was noted in the disk spaces or posteriorly into the spinal canal. No epidural venous contamination was seen. The working cannula and the bone filler were then retrieved and removed. Hemostasis was achieved at the skin  entry site. The patient's general anesthesia was then reversed and the patient was extubated. Upon recovery, the patient was able to follow simple commands appropriately. Patient was able to able to squeeze with both her hands, unable to bend both knees. She was then transferred to the PACU in stable condition. IMPRESSION: 1. Status post vertebral body augmentation using balloon kyphoplasty at T5 as described without event. If the patient has known osteoporosis, recommend treatment as clinically indicated. If the patient's bone density status is unknown, DEXA scan is recommended. Electronically Signed   By: Julieanne Cotton M.D.   On: 02/12/2023 08:15   MR THORACIC SPINE WO CONTRAST  Result Date: 02/05/2023 CLINICAL DATA:  Initial evaluation for compression fracture. EXAM: MRI THORACIC SPINE WITHOUT CONTRAST TECHNIQUE: Multiplanar, multisequence MR imaging of the thoracic spine was performed. No intravenous contrast was administered. COMPARISON:  Prior CT from 01/23/2023. FINDINGS: Alignment:  Examination degraded by motion artifact. Exaggeration of the normal thoracic kyphosis. No significant listhesis. Vertebrae: Chronic compression fractures involving the T4, T7, T8, T9, T10, T12, and L1 vertebral bodies with sequelae of prior vertebral augmentation. Mild bony retropulsion about several of these fractures without significant stenosis. Compression fracture involving the T2 vertebral body with mild 30% height loss and trace 2 mm retropulsion. Persistent mild marrow edema, consistent with a probable subacute compression fracture. No  significant stenosis. Compression fracture of the T3 vertebral body with advanced 80% height loss and 3 mm bony retropulsion. Persistent marrow edema, consistent with a subacute compression fracture. No significant stenosis. Fracture deformity of the T5 vertebral body without associated height loss or retropulsion, likely subacute in nature. Fracture deformity of the T7 vertebral body with advanced 70-80% height loss and trace 2 mm bony retropulsion. Persistent mild marrow edema, consistent with a probable subacute compression fracture. No significant stenosis. Mild compression deformity of the C7 vertebral body with mild marrow edema, likely a subacute compression fracture. No more than mild height loss without bony retropulsion or stenosis. Underlying bone marrow signal intensity within normal limits. No worrisome osseous lesions. Cord:  Normal signal and morphology. Paraspinal and other soft tissues: Paraspinous soft tissues demonstrate no acute finding. Small layering bilateral pleural effusions, left greater than right. Approximate 1 cm T2 hyperintense left renal cyst, benign in appearance, no follow-up imaging recommended. Disc levels: Underlying ordinary for age multilevel disc desiccation with mild disc bulging throughout the thoracic spine. Mild multilevel facet hypertrophy. No significant spinal stenosis within the thoracic spine. Moderate to advance multilevel foraminal narrowing, largely due to compression deformities and facet degeneration. IMPRESSION: 1. Subacute compression fractures involving the C7, T2, T3, T5, and T7 vertebral bodies as above. No significant associated spinal stenosis. 2. Chronic compression fractures involving the T4, T8, T9, T10, T12, and L1 vertebral bodies with sequelae of prior vertebral augmentation. 3. Underlying mild for age spondylosis without significant spinal stenosis. 4. Small layering bilateral pleural effusions, left greater than right. Electronically Signed   By:  Rise Mu M.D.   On: 02/05/2023 05:23   DG Lumbar Spine Complete  Result Date: 02/03/2023 CLINICAL DATA:  89 year old female with history of low back pain after a fall. EXAM: LUMBAR SPINE - COMPLETE 4+ VIEW COMPARISON:  No priors. FINDINGS: Multiple views of the lumbar spine demonstrate no definite acute displaced fracture or acute compression type fracture. Multiple chronic appearing compression fractures are noted at T11, L1, L2, L3 and L4, most severe at T11 where there is up to 50% loss of anterior vertebral body height. Post vertebroplasty changes  are noted at T11, L1 and L2. Mild levoscoliosis convex to the left at the level of L1. Severe multilevel degenerative disc disease and facet arthropathy is noted throughout the lumbar spine, with most severe facet arthropathy at L4-L5 and L5-S1. IMPRESSION: 1. While there are multiple chronic appearing compression fractures and post vertebroplasty changes, in addition to extensive multilevel degenerative disc disease and lumbar spondylosis, as above, no definite acute findings are noted. Electronically Signed   By: Trudie Reed M.D.   On: 02/03/2023 05:32   CT HEAD WO CONTRAST ( )  Result Date: 02/03/2023 CLINICAL DATA:  Fall, head and neck trauma, on blood thinners EXAM: CT HEAD WITHOUT CONTRAST CT CERVICAL SPINE WITHOUT CONTRAST TECHNIQUE: Multidetector CT imaging of the head and cervical spine was performed following the standard protocol without intravenous contrast. Multiplanar CT image reconstructions of the cervical spine were also generated. RADIATION DOSE REDUCTION: This exam was performed according to the departmental dose-optimization program which includes automated exposure control, adjustment of the mA and/or kV according to patient size and/or use of iterative reconstruction technique. COMPARISON:  01/23/2023 and 10/30/2022 CT of the thoracic spine FINDINGS: CT HEAD FINDINGS Brain: No evidence of acute infarct, hemorrhage, mass,  mass effect, or midline shift. No hydrocephalus or extra-axial fluid collection. Periventricular white matter changes, likely the sequela of chronic small vessel ischemic disease. Remote lacunar infarct in the right thalamus. Vascular: No hyperdense vessel. Atherosclerotic calcifications in the intracranial carotid and vertebral arteries. Skull: Negative for fracture or focal lesion. Sinuses/Orbits: No acute finding. Other: Fluid in left mastoid air cells. CT CERVICAL SPINE FINDINGS Alignment: No traumatic listhesis. Skull base and vertebrae: Osteopenia. Compression fracture of T2 is new compared to 01/23/2023, likely acute. Approximately 40% vertebral body height loss, with 3 mm retropulsion of the posterosuperior cortex. Unchanged compression deformities of C7 and T3. Status post T4 kyphoplasty. Soft tissues and spinal canal: There is likely trace prevertebral edema anterior to T2. No visible canal hematoma. Disc levels: Degenerative changes in the cervical spine. No significant spinal canal stenosis. Upper chest: Negative. IMPRESSION: 1. No acute intracranial process. 2. Acute compression fracture of T2 with approximately 40% vertebral body height loss and 3 mm retropulsion of the posterosuperior cortex. These findings were discussed by telephone on 02/03/2023 at 4:09 am with provider KELLY HUMES . Electronically Signed   By: Wiliam Ke M.D.   On: 02/03/2023 04:10   CT Cervical Spine Wo Contrast  Result Date: 02/03/2023 CLINICAL DATA:  Fall, head and neck trauma, on blood thinners EXAM: CT HEAD WITHOUT CONTRAST CT CERVICAL SPINE WITHOUT CONTRAST TECHNIQUE: Multidetector CT imaging of the head and cervical spine was performed following the standard protocol without intravenous contrast. Multiplanar CT image reconstructions of the cervical spine were also generated. RADIATION DOSE REDUCTION: This exam was performed according to the departmental dose-optimization program which includes automated exposure  control, adjustment of the mA and/or kV according to patient size and/or use of iterative reconstruction technique. COMPARISON:  01/23/2023 and 10/30/2022 CT of the thoracic spine FINDINGS: CT HEAD FINDINGS Brain: No evidence of acute infarct, hemorrhage, mass, mass effect, or midline shift. No hydrocephalus or extra-axial fluid collection. Periventricular white matter changes, likely the sequela of chronic small vessel ischemic disease. Remote lacunar infarct in the right thalamus. Vascular: No hyperdense vessel. Atherosclerotic calcifications in the intracranial carotid and vertebral arteries. Skull: Negative for fracture or focal lesion. Sinuses/Orbits: No acute finding. Other: Fluid in left mastoid air cells. CT CERVICAL SPINE FINDINGS Alignment: No traumatic listhesis. Skull base  and vertebrae: Osteopenia. Compression fracture of T2 is new compared to 01/23/2023, likely acute. Approximately 40% vertebral body height loss, with 3 mm retropulsion of the posterosuperior cortex. Unchanged compression deformities of C7 and T3. Status post T4 kyphoplasty. Soft tissues and spinal canal: There is likely trace prevertebral edema anterior to T2. No visible canal hematoma. Disc levels: Degenerative changes in the cervical spine. No significant spinal canal stenosis. Upper chest: Negative. IMPRESSION: 1. No acute intracranial process. 2. Acute compression fracture of T2 with approximately 40% vertebral body height loss and 3 mm retropulsion of the posterosuperior cortex. These findings were discussed by telephone on 02/03/2023 at 4:09 am with provider KELLY HUMES . Electronically Signed   By: Wiliam Ke M.D.   On: 02/03/2023 04:10   DG Pelvis Portable  Result Date: 02/03/2023 CLINICAL DATA:  Status post fall. EXAM: PORTABLE PELVIS 1-2 VIEWS COMPARISON:  January 23, 2023 FINDINGS: There is no evidence of an acute pelvic fracture or dislocation. Bilateral proximal femoral intramedullary rods and compression screw  device is are seen. Chronic bilateral inferior pubic rami fractures are noted. Marked severity degenerative changes are seen within the visualized portion of the lower lumbar spine. IMPRESSION: 1. No acute pelvic fracture or dislocation. 2. Prior ORIF of bilateral proximal femoral fractures, with chronic fractures of the bilateral inferior pubic rami. Electronically Signed   By: Aram Candela M.D.   On: 02/03/2023 03:43   DG Chest Port 1 View  Result Date: 02/03/2023 CLINICAL DATA:  Status post fall. EXAM: PORTABLE CHEST 1 VIEW COMPARISON:  January 23, 2023 FINDINGS: The heart size and mediastinal contours are within normal limits. Low lung volumes are noted with diffuse, chronic appearing increased interstitial lung markings. Mild atelectasis and/or infiltrate is seen within the left lung base. A small left pleural effusion is seen. No pneumothorax is identified. There are multiple chronic bilateral rib fractures. A chronic fracture deformity is seen involving the head and neck of the proximal right humerus. Multilevel vertebroplasty is noted throughout the thoracic spine. IMPRESSION: 1. Chronic changes with mild left basilar atelectasis and/or infiltrate. 2. Small left pleural effusion. Electronically Signed   By: Aram Candela M.D.   On: 02/03/2023 03:38   CT HEAD WO CONTRAST  Result Date: 01/23/2023 CLINICAL DATA:  Trauma. EXAM: CT HEAD WITHOUT CONTRAST CT CERVICAL SPINE WITHOUT CONTRAST TECHNIQUE: Multidetector CT imaging of the head and cervical spine was performed following the standard protocol without intravenous contrast. Multiplanar CT image reconstructions of the cervical spine were also generated. RADIATION DOSE REDUCTION: This exam was performed according to the departmental dose-optimization program which includes automated exposure control, adjustment of the mA and/or kV according to patient size and/or use of iterative reconstruction technique. COMPARISON:  Head CT dated  09/29/2022. Thoracic spine CT dated 10/30/2022. FINDINGS: CT HEAD FINDINGS Brain: Moderate age-related atrophy and advanced chronic microvascular ischemic changes. Right colonic old lacunar infarct. There is no acute intracranial hemorrhage. No mass effect or midline shift no extra-axial fluid collection. Vascular: No hyperdense vessel or unexpected calcification. Skull: Normal. Negative for fracture or focal lesion. Sinuses/Orbits: The visualized paranasal sinuses and the right mastoid air cells are clear. Minimal left mastoid effusion. Other: Left posterior scalp contusion. CT CERVICAL SPINE FINDINGS Alignment: No acute subluxation. Skull base and vertebrae: Evaluation of the osseous structures is very limited due to severe osteoporosis. No definite acute fracture. Compression fracture of C7 as well as partially visualized fracture of T3 also seen on the CT of 10/30/2022. Soft tissues and spinal canal:  There is induration and thickening of the anterior paravertebral soft tissue at T3-T4 concerning for small hematoma (76/5). Disc levels:  No acute findings. Upper chest: No acute findings. Other: Bilateral carotid bulb calcified plaques. IMPRESSION: 1. No acute intracranial pathology. 2. Moderate age-related atrophy and advanced chronic microvascular ischemic changes. 3. No acute/traumatic cervical spine pathology. 4. Compression fracture of C7 as well as partially visualized fracture of T3 also seen on the CT of 10/30/2022. 5. Small anterior paravertebral hematoma at T3-T4. These results were called by telephone at the time of interpretation on 01/23/2023 at 11:18 pm to provider [facility] , who verbally acknowledged these results. Electronically Signed   By: Elgie Collard M.D.   On: 01/23/2023 23:22   CT CERVICAL SPINE WO CONTRAST  Result Date: 01/23/2023 CLINICAL DATA:  Trauma. EXAM: CT HEAD WITHOUT CONTRAST CT CERVICAL SPINE WITHOUT CONTRAST TECHNIQUE: Multidetector CT imaging of the head and cervical  spine was performed following the standard protocol without intravenous contrast. Multiplanar CT image reconstructions of the cervical spine were also generated. RADIATION DOSE REDUCTION: This exam was performed according to the departmental dose-optimization program which includes automated exposure control, adjustment of the mA and/or kV according to patient size and/or use of iterative reconstruction technique. COMPARISON:  Head CT dated 09/29/2022. Thoracic spine CT dated 10/30/2022. FINDINGS: CT HEAD FINDINGS Brain: Moderate age-related atrophy and advanced chronic microvascular ischemic changes. Right colonic old lacunar infarct. There is no acute intracranial hemorrhage. No mass effect or midline shift no extra-axial fluid collection. Vascular: No hyperdense vessel or unexpected calcification. Skull: Normal. Negative for fracture or focal lesion. Sinuses/Orbits: The visualized paranasal sinuses and the right mastoid air cells are clear. Minimal left mastoid effusion. Other: Left posterior scalp contusion. CT CERVICAL SPINE FINDINGS Alignment: No acute subluxation. Skull base and vertebrae: Evaluation of the osseous structures is very limited due to severe osteoporosis. No definite acute fracture. Compression fracture of C7 as well as partially visualized fracture of T3 also seen on the CT of 10/30/2022. Soft tissues and spinal canal: There is induration and thickening of the anterior paravertebral soft tissue at T3-T4 concerning for small hematoma (76/5). Disc levels:  No acute findings. Upper chest: No acute findings. Other: Bilateral carotid bulb calcified plaques. IMPRESSION: 1. No acute intracranial pathology. 2. Moderate age-related atrophy and advanced chronic microvascular ischemic changes. 3. No acute/traumatic cervical spine pathology. 4. Compression fracture of C7 as well as partially visualized fracture of T3 also seen on the CT of 10/30/2022. 5. Small anterior paravertebral hematoma at T3-T4.  These results were called by telephone at the time of interpretation on 01/23/2023 at 11:18 pm to provider [facility] , who verbally acknowledged these results. Electronically Signed   By: Elgie Collard M.D.   On: 01/23/2023 23:22   CT CHEST ABDOMEN PELVIS W CONTRAST  Result Date: 01/23/2023 CLINICAL DATA:  Trauma.  Fall. EXAM: CT CHEST, ABDOMEN, AND PELVIS WITH CONTRAST TECHNIQUE: Multidetector CT imaging of the chest, abdomen and pelvis was performed following the standard protocol during bolus administration of intravenous contrast. RADIATION DOSE REDUCTION: This exam was performed according to the departmental dose-optimization program which includes automated exposure control, adjustment of the mA and/or kV according to patient size and/or use of iterative reconstruction technique. CONTRAST:  75mL OMNIPAQUE IOHEXOL 350 MG/ML SOLN COMPARISON:  Thoracic spine x-ray 10/30/2022. CT chest abdomen and pelvis 09/28/2022. FINDINGS: CT CHEST FINDINGS Cardiovascular: Heart is enlarged. Aorta is ectatic. There are atherosclerotic calcifications of the aorta and coronary arteries. There is no  pericardial effusion. Mediastinum/Nodes: There is a large hiatal hernia containing the proximal stomach. There is mild fluid distention of the esophagus. The thyroid gland is within normal limits. There are calcified subcarinal and right hilar lymph nodes. No mediastinal hematoma or pneumomediastinum identified. Lungs/Pleura: There are atelectatic changes in the bilateral lower lobes. There is no pleural effusion or pneumothorax. Trachea and central airways are patent. There is scarring in the right lung apex. Musculoskeletal: There are numerous healed bilateral rib fractures. No acute fractures are seen. The bones are osteopenic. There are degenerative changes of the right shoulder. Vertebroplasty changes are noted at T4, T8, T9, T10 and T12 similar to prior study. Chronic compression deformity of T7 appears stable. There has  been progression of compression deformity of T3. CT ABDOMEN PELVIS FINDINGS Hepatobiliary: Indeterminate lesion in the left lobe of the liver measures 2.2 x 2.8 cm image 3/51 this appears similar to prior. The liver is enlarged, unchanged. Gallbladder is surgically absent. There is no significant biliary ductal dilatation. Pancreas: Unremarkable. No pancreatic ductal dilatation or surrounding inflammatory changes. Spleen: No splenic injury or perisplenic hematoma. Calcified granulomas are present. Adrenals/Urinary Tract: No adrenal hemorrhage or renal injury identified. Bladder is unremarkable. Stomach/Bowel: No evidence of bowel wall thickening, distention, or inflammatory changes. The appendix is not seen. Vascular/Lymphatic: Aortic atherosclerosis. No enlarged abdominal or pelvic lymph nodes. Reproductive: Status post hysterectomy. No adnexal masses. Other: No abdominal wall hernia or abnormality. No abdominopelvic ascites. Musculoskeletal: The bones are diffusely osteopenic. Bilateral hip screws are present. Vertebroplasty changes at L1 and L2 are again noted. Mild compression deformities of L3 on L4 are unchanged. There are healed inferior pubic rami fractures bilaterally. No acute fractures are seen. IMPRESSION: 1. Progression of T3 compression deformity is age indeterminate. Correlate for point tenderness. 2. No other acute posttraumatic sequelae in the chest, abdomen or pelvis. 3. Cardiomegaly. 4. Large hiatal hernia. 5. Indeterminate lesion in the left lobe of the liver appears similar to prior. This can be further evaluated with MRI if clinically appropriate. Aortic Atherosclerosis (ICD10-I70.0). Electronically Signed   By: Darliss Cheney M.D.   On: 01/23/2023 23:02   DG Pelvis Portable  Result Date: 01/23/2023 CLINICAL DATA:  Trauma EXAM: PORTABLE PELVIS 1-2 VIEWS COMPARISON:  09/28/2022 FINDINGS: SI joint degenerative change. Pubic symphysis appears intact. Old inferior pubic rami fractures.  Intramedullary rods and fixating screws within the proximal femurs with remote fracture deformities. No acute fracture or dislocation IMPRESSION: No acute osseous abnormality. Old fracture deformities of the inferior pubic rami and proximal humerus status post bilateral femoral intramedullary rodding. Electronically Signed   By: Jasmine Pang M.D.   On: 01/23/2023 22:27   DG Chest Port 1 View  Result Date: 01/23/2023 CLINICAL DATA:  Trauma. EXAM: PORTABLE CHEST 1 VIEW COMPARISON:  Chest radiograph dated 01/03/2023. FINDINGS: No focal consolidation, pleural effusion, pneumothorax. Bibasilar linear atelectasis/scarring. Stable cardiac silhouette. Severe osteopenia with multilevel old compression fracture and vertebroplasty. 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 Probable old right rib fractures. Multiple surgical clips in the left breast. No definite acute osseous pathology. Evaluation of the osseous structures however is limited due to severe osteopenia. IMPRESSION: 1. No acute cardiopulmonary process. 2. 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 Electronically Signed   By: Elgie Collard M.D.   On: 01/23/2023 22:26     Subjective: Patient was seen and examined at bedside.  Overnight events noted.  Patient is status post T2 kyphoplasty tolerated well.  She still  reports having back pain but much better.  Discharge Exam: Vitals:   02/13/23 0419 02/13/23 0730  BP: (!) 171/88 133/69  Pulse: 83 84  Resp: 18 18  Temp:  98.5 F (36.9 C)  SpO2: 93% 94%   Vitals:   02/12/23 1942 02/13/23 0419 02/13/23 0500 02/13/23 0730  BP: 134/81 (!) 171/88  133/69  Pulse: 88 83  84  Resp: 16 18  18   Temp: 98.2 F (36.8 C)   98.5 F (36.9 C)  TempSrc:    Oral  SpO2: 94% 93%  94%  Weight:   49 kg     General: Pt is alert, awake, not in acute distress Cardiovascular: RRR, S1/S2 +, no rubs, no gallops Respiratory: CTA bilaterally, no wheezing, no  rhonchi Abdominal: Soft, NT, ND, bowel sounds + Extremities: no edema, no cyanosis    The results of significant diagnostics from this hospitalization (including imaging, microbiology, ancillary and laboratory) are listed below for reference.     Microbiology: Recent Results (from the past 240 hour(s))  Surgical pcr screen     Status: None   Collection Time: 02/11/23  7:54 AM   Specimen: Nasal Mucosa; Nasal Swab  Result Value Ref Range Status   MRSA, PCR NEGATIVE NEGATIVE Final   Staphylococcus aureus NEGATIVE NEGATIVE Final    Comment: (NOTE) The Xpert SA Assay (FDA approved for NASAL specimens in patients 47 years of age and older), is one component of a comprehensive surveillance program. It is not intended to diagnose infection nor to guide or monitor treatment. Performed at [facility], [address] [place], [place] [address]      Labs: BNP (last 3 results) Recent Labs    09/29/22 0219  BNP 222.0*   Basic Metabolic Panel: Recent Labs  Lab 02/07/23 0352 02/08/23 0841 02/11/23 0546  NA 129* 129* 130*  K 4.1 3.6 4.3  CL 94* 92* 94*  CO2 27 28 25   GLUCOSE 104* 124* 116*  BUN 11 17 14   CREATININE 0.63 0.77 0.68  CALCIUM 8.6* 8.6* 8.6*  MG 2.1 2.0 1.9  PHOS 3.0 2.8 3.9   Liver Function Tests: No results for input(s): "AST", "ALT", "ALKPHOS", "BILITOT", "PROT", "ALBUMIN" in the last 168 hours. No results for input(s): "LIPASE", "AMYLASE" in the last 168 hours. No results for input(s): "AMMONIA" in the last 168 hours. CBC: Recent Labs  Lab 02/07/23 0352 02/08/23 0841 02/11/23 0546  WBC 11.3* 7.6 9.1  HGB 14.1 13.8 12.4  HCT 44.4 42.6 40.6  MCV 83.1 82.4 83.2  PLT 475* 479* 422*   Cardiac Enzymes: No results for input(s): "CKTOTAL", "CKMB", "CKMBINDEX", "TROPONINI" in the last 168 hours. BNP: Invalid input(s): "POCBNP" CBG: Recent Labs  Lab 02/08/23 1111  GLUCAP 109*   D-Dimer No results for input(s): "DDIMER" in the last 72  hours. Hgb A1c No results for input(s): "HGBA1C" in the last 72 hours. Lipid Profile No results for input(s): "CHOL", "HDL", "LDLCALC", "TRIG", "CHOLHDL", "LDLDIRECT" in the last 72 hours. Thyroid function studies No results for input(s): "TSH", "T4TOTAL", "T3FREE", "THYROIDAB" in the last 72 hours.  Invalid input(s): "FREET3" Anemia work up No results for input(s): "VITAMINB12", "FOLATE", "FERRITIN", "TIBC", "IRON", "RETICCTPCT" in the last 72 hours. Urinalysis    Component Value Date/Time   COLORURINE STRAW (A) 01/23/2023 2142   APPEARANCEUR CLEAR 01/23/2023 2142   LABSPEC 1.006 01/23/2023 2142   LABSPEC 1.010 05/27/2013 1433   PHURINE 8.0 01/23/2023 2142   GLUCOSEU NEGATIVE 01/23/2023 2142   GLUCOSEU  NEGATIVE 09/02/2017 1347   HGBUR NEGATIVE 01/23/2023 2142   HGBUR negative 10/06/2009 1015   BILIRUBINUR NEGATIVE 01/23/2023 2142   BILIRUBINUR Negative 01/21/2018 1618   KETONESUR NEGATIVE 01/23/2023 2142   PROTEINUR NEGATIVE 01/23/2023 2142   UROBILINOGEN negative (A) 01/21/2018 1618   UROBILINOGEN 0.2 09/02/2017 1347   UROBILINOGEN 0.2 05/27/2013 1433   NITRITE NEGATIVE 01/23/2023 2142   LEUKOCYTESUR NEGATIVE 01/23/2023 2142   Sepsis Labs Recent Labs  Lab 02/07/23 0352 02/08/23 0841 02/11/23 0546  WBC 11.3* 7.6 9.1   Microbiology Recent Results (from the past 240 hour(s))  Surgical pcr screen     Status: None   Collection Time: 02/11/23  7:54 AM   Specimen: Nasal Mucosa; Nasal Swab  Result Value Ref Range Status   MRSA, PCR NEGATIVE NEGATIVE Final   Staphylococcus aureus NEGATIVE NEGATIVE Final    Comment: (NOTE) The Xpert SA Assay (FDA approved for NASAL specimens in patients 28 years of age and older), is one component of a comprehensive surveillance program. It is not intended to diagnose infection nor to guide or monitor treatment. Performed at [facility], [address], [place], [place] [address]      Time coordinating discharge: Over 30  minutes  SIGNED:   Burnadette Pop, MD  [facility] 02/13/2023, 8:50 AM Pager   If 7PM-7AM, please contact night-coverage

## 2023-02-13 ENCOUNTER — Emergency Department (HOSPITAL_COMMUNITY)
Admission: EM | Admit: 2023-02-13 | Discharge: 2023-02-14 | Disposition: A | Discharge status: Rehabilitation Facility | Payer: Medicare Other | Attending: Emergency Medicine | Admitting: Emergency Medicine

## 2023-02-13 ENCOUNTER — Other Ambulatory Visit: Payer: Self-pay

## 2023-02-13 ENCOUNTER — Emergency Department (HOSPITAL_COMMUNITY): Payer: Medicare Other

## 2023-02-13 DIAGNOSIS — M1811 Unilateral primary osteoarthritis of first carpometacarpal joint, right hand: Secondary | ICD-10-CM | POA: Diagnosis not present

## 2023-02-13 DIAGNOSIS — S52501A Unspecified fracture of the lower end of right radius, initial encounter for closed fracture: Secondary | ICD-10-CM | POA: Diagnosis not present

## 2023-02-13 DIAGNOSIS — Z7901 Long term (current) use of anticoagulants: Secondary | ICD-10-CM | POA: Diagnosis not present

## 2023-02-13 DIAGNOSIS — M79641 Pain in right hand: Secondary | ICD-10-CM | POA: Insufficient documentation

## 2023-02-13 DIAGNOSIS — S62101A Fracture of unspecified carpal bone, right wrist, initial encounter for closed fracture: Secondary | ICD-10-CM

## 2023-02-13 DIAGNOSIS — S52571A Other intraarticular fracture of lower end of right radius, initial encounter for closed fracture: Secondary | ICD-10-CM | POA: Insufficient documentation

## 2023-02-13 DIAGNOSIS — Y92002 Bathroom of unspecified non-institutional (private) residence single-family (private) house as the place of occurrence of the external cause: Secondary | ICD-10-CM | POA: Insufficient documentation

## 2023-02-13 DIAGNOSIS — S52611A Displaced fracture of right ulna styloid process, initial encounter for closed fracture: Secondary | ICD-10-CM | POA: Diagnosis not present

## 2023-02-13 DIAGNOSIS — S6291XA Unspecified fracture of right wrist and hand, initial encounter for closed fracture: Secondary | ICD-10-CM | POA: Insufficient documentation

## 2023-02-13 DIAGNOSIS — W19XXXA Unspecified fall, initial encounter: Secondary | ICD-10-CM | POA: Insufficient documentation

## 2023-02-13 DIAGNOSIS — S61511A Laceration without foreign body of right wrist, initial encounter: Secondary | ICD-10-CM | POA: Diagnosis not present

## 2023-02-13 MED ORDER — ENSURE ENLIVE PO LIQD: 237.0000 mL | ORAL | Status: AC

## 2023-02-13 MED ORDER — OXYCODONE HCL 5 MG PO TABS: 5.0000 mg | ORAL_TABLET | Freq: Four times a day (QID) | ORAL | 0 refills | Status: AC | PRN

## 2023-02-13 MED ORDER — SENNA 8.6 MG PO TABS: 1.0000 | ORAL_TABLET | Freq: Two times a day (BID) | ORAL | Status: AC

## 2023-02-13 MED ORDER — OXYCODONE HCL 5 MG PO TABS
2.5000 mg | ORAL_TABLET | Freq: Once | ORAL | Status: AC
  Administered 2023-02-13: 2.5 mg via ORAL
  Filled 2023-02-13: qty 1

## 2023-02-13 MED ORDER — POLYETHYLENE GLYCOL 3350 17 G PO PACK: 17.0000 g | PACK | Freq: Every day | ORAL | Status: AC

## 2023-02-13 NOTE — ED Notes (Signed)
PTAR called  

## 2023-02-13 NOTE — Progress Notes (Signed)
Patient seen and examined at bedside today.  She is hemodynamically stable.  The plan is to discharge her to SNF today.  Does have some back pain but not in distress. Medically stable for discharge.  She still has a Foley catheter will give voiding trial before discharge.  If she continues to retain, will put the Foley back and it can be taken out at the SNF.

## 2023-02-13 NOTE — ED Triage Notes (Signed)
Pt BIB [facility] from [facility]. Staff at facility reported pt had mechanical fall, on eliquis, denies hitting head. Skin tear on R hand.pt a/ox4

## 2023-02-13 NOTE — Discharge Instructions (Signed)
You were seen today for a fall.  You do have a right wrist fracture.  This should heal up with the splint that we have placed in place but you should still follow-up with an orthopedic next week for repeat x-rays to make sure it is healing up appropriately.

## 2023-02-13 NOTE — Progress Notes (Signed)
Report given to AmyLPN at [facility]. All questions and concerns were fully answered. Pt d/c to [facility] as ordered, Discharge summary(AVS) documents provided to PTAR. Pt remains alert/oriented in no apparent distress. No complaints.

## 2023-02-13 NOTE — TOC Transition Note (Signed)
Transition of Care [facility]) - CM/SW Discharge Note   Patient Details  Name: [name] MRN: [id number] Date of Birth: 1930/03/05  [facility]) CM/SW Contact:  Lorri Frederick, LCSW Phone Number: 02/13/2023, 10:29 AM   Clinical Narrative:   Pt discharging to [facility], room 409.  RN call report to [phone].  0830: CSW confirmed with [facility] that they can receive pt today.   Final next level of care: Skilled Nursing Facility Barriers to Discharge: Continued Medical Work up   Patient Goals and CMS Choice   Choice offered to / list presented to : Adult Children (daughter [name])  Discharge Placement                Patient chooses bed at: [facility] Patient to be transferred to facility by: PTAR Name of family member notified: daughter [name] Patient and family notified of of transfer: 02/13/23  Discharge Plan and Services Additional resources added to the After Visit Summary for   In-house Referral: Clinical Social Work   Post Acute Care Choice: Skilled Nursing Facility                               Social Determinants of Health (SDOH) Interventions SDOH Screenings   Food Insecurity: No Food Insecurity (02/03/2023)  Housing: Low Risk  (02/03/2023)  Transportation Needs: No Transportation Needs (02/03/2023)  Utilities: Not At Risk (02/03/2023)  Depression (PHQ2-9): Low Risk  (12/19/2021)  Financial Resource Strain: Low Risk  (08/10/2021)  Social Connections: Unknown (10/03/2021)   Received from [facility]  Tobacco Use: High Risk (02/03/2023)     Readmission Risk Interventions     No data to display

## 2023-02-13 NOTE — Progress Notes (Signed)
Orthopedic Tech Progress Note Patient Details:  [name] May 06, 1930 [id number]  Patient stated her hand hurt very bad and did  not want velcro brace on hand. I did notify RN that it is in room on the bedside table   Ortho Devices Type of Ortho Device: Velcro wrist splint Ortho Device/Splint Location: RUE Ortho Device/Splint Interventions: Ordered, Other (comment)   Post Interventions Patient Tolerated: Poor, Other (comment) Instructions Provided: Care of device  Donald Pore 02/13/2023, 11:39 PM

## 2023-02-13 NOTE — ED Provider Notes (Signed)
[place] EMERGENCY DEPARTMENT AT [facility] Provider Note   CSN: [id number] Arrival date & time: 02/13/23  2129     History Chief Complaint  Patient presents with   Fall    HPI [name] is a 89 y.o. female presenting for Fall at her facility.  Was up in her bathroom when they heard her fall.  Obvious injury to right hand.  History of similar.  Lives at facility.  Did not hit her head..   Patient's recorded medical, surgical, social, medication list and allergies were reviewed in the Snapshot window as part of the initial history.   Review of Systems   Review of Systems  Constitutional:  Negative for chills and fever.  HENT:  Negative for ear pain and sore throat.   Eyes:  Negative for pain and visual disturbance.  Respiratory:  Negative for cough and shortness of breath.   Cardiovascular:  Negative for chest pain and palpitations.  Gastrointestinal:  Negative for abdominal pain and vomiting.  Genitourinary:  Negative for dysuria and hematuria.  Musculoskeletal:  Negative for arthralgias and back pain.  Skin:  Negative for color change and rash.  Neurological:  Negative for seizures and syncope.  All other systems reviewed and are negative.   Physical Exam Updated Vital Signs BP (!) 155/90 (BP Location: Right Arm)   Pulse 88   Temp 97.9 F (36.6 C) (Oral)   Resp 18   Ht 5\' 5"  (1.651 m)   Wt 49 kg   SpO2 96%   BMI 17.98 kg/m  Physical Exam Constitutional:      General: She is not in acute distress.    Appearance: She is not ill-appearing or toxic-appearing.  HENT:     Head: Normocephalic and atraumatic.  Eyes:     Extraocular Movements: Extraocular movements intact.     Pupils: Pupils are equal, round, and reactive to light.  Cardiovascular:     Rate and Rhythm: Normal rate.  Pulmonary:     Effort: No respiratory distress.  Abdominal:     General: Abdomen is flat.  Musculoskeletal:        General: Signs of injury (Large injury to  right hand.  Obvious skin tear missing avulsed tissue.  Slight deformity over the radial head.  Tender to palpation at the site.  Neurovascularly intact distally.  Painless range of motion of the hands.) present. No swelling or deformity.     Cervical back: Normal range of motion. No rigidity.  Skin:    General: Skin is warm and dry.  Neurological:     General: No focal deficit present.     Mental Status: She is alert and oriented to person, place, and time.  Psychiatric:        Mood and Affect: Mood normal.      ED Course/ Medical Decision Making/ A&P    Procedures .Marland KitchenLaceration Repair  Date/Time: 02/13/2023 10:54 PM  Performed by: Glyn Ade, MD Authorized by: Glyn Ade, MD   Consent:    Consent obtained:  Verbal   Consent given by:  Parent   Risks, benefits, and alternatives were discussed: yes   Laceration details:    Length (cm):  1 Treatment:    Amount of cleaning:  Standard Skin repair:    Repair method:  Tissue adhesive and Steri-Strips Approximation:    Approximation:  Close Repair type:    Repair type:  Simple Post-procedure details:    Procedure completion:  Tolerated    Medications Ordered  in ED Medications  oxyCODONE (Oxy IR/ROXICODONE) immediate release tablet 2.5 mg (has no administration in time range)    Medical Decision Making:   [name] is a 89 y.o. female who presented to the ED today with a fall at their living facility detailed above. They are on a blood thinner. Patient placed on continuous vitals and telemetry monitoring while in ED which was reviewed periodically.   Complete initial physical exam performed, notably the patient  was hemodynamically stable  in no acute distress. No obvious deformities (besides right wrist mentioned above) or injuries appreciated on extensive physical exam including active range of motion of all joints.     Reviewed and confirmed nursing documentation for past medical history, family  history, social history.    Initial Assessment/Plan:   This is a patient presenting with a moderate blunt mechanism trauma.  As such, I have considered intracranial injuries including intracranial hemorrhage, intrathoracic injuries including blunt myocardial or blunt lung injury, blunt abdominal injuries including aortic dissection, bladder injury, spleen injury, liver injury and I have considered orthopedic injuries including extremity or spinal injury. This is most consistent with an acute life/limb threatening illness complicated by underlying chronic conditions. Images reviewed and agree with radiology interpretation.  DG Wrist Complete Right  Result Date: 02/13/2023 CLINICAL DATA:  Fall EXAM: RIGHT WRIST - COMPLETE 3+ VIEW COMPARISON:  None Available. FINDINGS: Acute mildly displaced ulnar styloid process fracture. Acute nondisplaced intra-articular distal radius fracture with slight impaction. Advanced arthritis at the first [facility] joint and STT interval. No dislocation IMPRESSION: 1. Acute nondisplaced intra-articular distal radius fracture. 2. Acute mildly displaced ulnar styloid process fracture. Electronically Signed   By: Jasmine Pang M.D.   On: 02/13/2023 22:41    Final Reassessment and Plan:   Right wrist fracture appreciated on exam and on x-ray.  Placed in a removable splint given her age and comorbidities.  She is likely to struggle with splint placement complete nonfunction of her right hand.  Since she is in no acute distress and I believe that simple immobilization with a removable brace would be the most convenient for the patient.  Will refer to orthopedic in the outpatient setting.  Appears that her primary care provider sent in oxycodone to her pharmacy already today.  Instructed patient to use that medication as needed for pain.  Unfortunately she also has a very severe skin tear with substantial amounts of separation.  Does not appear to be amenable to laceration repair with  stitches.  Use Steri-Strips for closest approximation without putting the soft skin under pressure.  Disposition:  I have considered need for hospitalization, however, considering all of the above, I believe this patient is stable for discharge at this time.  Patient/family educated about specific return precautions for given chief complaint and symptoms.  Patient/family educated about follow-up with PCP.     Patient/family expressed understanding of return precautions and need for follow-up. Patient spoken to regarding all imaging and laboratory results and appropriate follow up for these results. All education provided in verbal form with additional information in written form. Time was allowed for answering of patient questions. Patient discharged.    Emergency Department Medication Summary:   Medications  oxyCODONE (Oxy IR/ROXICODONE) immediate release tablet 2.5 mg (has no administration in time range)            Clinical Impression:  1. Fall, initial encounter   2. Closed fracture of right wrist, initial encounter      Discharge  Final Clinical Impression(s) / ED Diagnoses Final diagnoses:  Fall, initial encounter  Closed fracture of right wrist, initial encounter    Rx / DC Orders ED Discharge Orders     None         Glyn Ade, MD 02/13/23 2257

## 2023-02-13 NOTE — Care Management Important Message (Signed)
Important Message  Patient Details  Name: [name] MRN: [id number] Date of Birth: Mar 02, 1930   Important Message Given:  Yes - Medicare IM     Sherilyn Banker 02/13/2023, 1:10 PM

## 2023-02-13 NOTE — ED Notes (Signed)
[facility] called for transport back to [facility]

## 2023-02-13 NOTE — Plan of Care (Signed)
  Problem: Education: Goal: Knowledge of General Education information will improve Description: Including pain rating scale, medication(s)/side effects and non-pharmacologic comfort measures Outcome: Adequate for Discharge   Problem: Clinical Measurements: Goal: Will remain free from infection Outcome: Adequate for Discharge   Problem: Activity: Goal: Risk for activity intolerance will decrease Outcome: Adequate for Discharge   Problem: Coping: Goal: Level of anxiety will decrease Outcome: Adequate for Discharge   Problem: Elimination: Goal: Will not experience complications related to bowel motility Outcome: Adequate for Discharge   Problem: Pain Managment: Goal: General experience of comfort will improve Outcome: Adequate for Discharge

## 2023-02-14 DIAGNOSIS — I25110 Atherosclerotic heart disease of native coronary artery with unstable angina pectoris: Secondary | ICD-10-CM | POA: Diagnosis not present

## 2023-02-14 DIAGNOSIS — I2699 Other pulmonary embolism without acute cor pulmonale: Secondary | ICD-10-CM | POA: Diagnosis not present

## 2023-02-14 DIAGNOSIS — R2681 Unsteadiness on feet: Secondary | ICD-10-CM | POA: Diagnosis not present

## 2023-02-14 DIAGNOSIS — J9690 Respiratory failure, unspecified, unspecified whether with hypoxia or hypercapnia: Secondary | ICD-10-CM | POA: Diagnosis not present

## 2023-02-14 DIAGNOSIS — I428 Other cardiomyopathies: Secondary | ICD-10-CM | POA: Diagnosis not present

## 2023-02-14 DIAGNOSIS — R531 Weakness: Secondary | ICD-10-CM | POA: Diagnosis not present

## 2023-02-14 DIAGNOSIS — I5032 Chronic diastolic (congestive) heart failure: Secondary | ICD-10-CM | POA: Diagnosis not present

## 2023-02-14 DIAGNOSIS — S22028D Other fracture of second thoracic vertebra, subsequent encounter for fracture with routine healing: Secondary | ICD-10-CM | POA: Diagnosis not present

## 2023-02-14 DIAGNOSIS — Z981 Arthrodesis status: Secondary | ICD-10-CM | POA: Diagnosis not present

## 2023-02-14 DIAGNOSIS — S12190D Other displaced fracture of second cervical vertebra, subsequent encounter for fracture with routine healing: Secondary | ICD-10-CM | POA: Diagnosis not present

## 2023-02-14 DIAGNOSIS — R2689 Other abnormalities of gait and mobility: Secondary | ICD-10-CM | POA: Diagnosis not present

## 2023-02-14 DIAGNOSIS — G4700 Insomnia, unspecified: Secondary | ICD-10-CM | POA: Diagnosis not present

## 2023-02-14 DIAGNOSIS — I639 Cerebral infarction, unspecified: Secondary | ICD-10-CM | POA: Diagnosis not present

## 2023-02-14 DIAGNOSIS — I480 Paroxysmal atrial fibrillation: Secondary | ICD-10-CM | POA: Diagnosis not present

## 2023-02-14 DIAGNOSIS — M6281 Muscle weakness (generalized): Secondary | ICD-10-CM | POA: Diagnosis not present

## 2023-02-14 DIAGNOSIS — E43 Unspecified severe protein-calorie malnutrition: Secondary | ICD-10-CM | POA: Diagnosis not present

## 2023-02-14 DIAGNOSIS — Z7401 Bed confinement status: Secondary | ICD-10-CM | POA: Diagnosis not present

## 2023-02-14 DIAGNOSIS — K219 Gastro-esophageal reflux disease without esophagitis: Secondary | ICD-10-CM | POA: Diagnosis not present

## 2023-02-14 DIAGNOSIS — R278 Other lack of coordination: Secondary | ICD-10-CM | POA: Diagnosis not present

## 2023-02-14 DIAGNOSIS — D500 Iron deficiency anemia secondary to blood loss (chronic): Secondary | ICD-10-CM | POA: Diagnosis not present

## 2023-02-14 DIAGNOSIS — Z955 Presence of coronary angioplasty implant and graft: Secondary | ICD-10-CM | POA: Diagnosis not present

## 2023-02-14 DIAGNOSIS — E785 Hyperlipidemia, unspecified: Secondary | ICD-10-CM | POA: Diagnosis not present

## 2023-02-14 DIAGNOSIS — C50919 Malignant neoplasm of unspecified site of unspecified female breast: Secondary | ICD-10-CM | POA: Diagnosis not present

## 2023-02-14 DIAGNOSIS — R079 Chest pain, unspecified: Secondary | ICD-10-CM | POA: Diagnosis not present

## 2023-02-14 DIAGNOSIS — S52611D Displaced fracture of right ulna styloid process, subsequent encounter for closed fracture with routine healing: Secondary | ICD-10-CM | POA: Diagnosis not present

## 2023-02-14 DIAGNOSIS — S065XAD Traumatic subdural hemorrhage with loss of consciousness status unknown, subsequent encounter: Secondary | ICD-10-CM | POA: Diagnosis not present

## 2023-02-14 DIAGNOSIS — M8008XD Age-related osteoporosis with current pathological fracture, vertebra(e), subsequent encounter for fracture with routine healing: Secondary | ICD-10-CM | POA: Diagnosis not present

## 2023-02-14 DIAGNOSIS — Z9181 History of falling: Secondary | ICD-10-CM | POA: Diagnosis not present

## 2023-02-14 DIAGNOSIS — R296 Repeated falls: Secondary | ICD-10-CM | POA: Diagnosis not present

## 2023-02-14 DIAGNOSIS — R1311 Dysphagia, oral phase: Secondary | ICD-10-CM | POA: Diagnosis not present

## 2023-02-14 DIAGNOSIS — I10 Essential (primary) hypertension: Secondary | ICD-10-CM | POA: Diagnosis not present

## 2023-02-14 NOTE — ED Notes (Signed)
Pt refuses wrist brace

## 2023-02-15 DIAGNOSIS — R296 Repeated falls: Secondary | ICD-10-CM | POA: Diagnosis not present

## 2023-02-15 DIAGNOSIS — M8008XD Age-related osteoporosis with current pathological fracture, vertebra(e), subsequent encounter for fracture with routine healing: Secondary | ICD-10-CM | POA: Diagnosis not present

## 2023-02-15 DIAGNOSIS — J9690 Respiratory failure, unspecified, unspecified whether with hypoxia or hypercapnia: Secondary | ICD-10-CM | POA: Diagnosis not present

## 2023-02-16 DIAGNOSIS — R079 Chest pain, unspecified: Secondary | ICD-10-CM | POA: Diagnosis not present

## 2023-02-18 DIAGNOSIS — S12190D Other displaced fracture of second cervical vertebra, subsequent encounter for fracture with routine healing: Secondary | ICD-10-CM | POA: Diagnosis not present

## 2023-02-18 DIAGNOSIS — E43 Unspecified severe protein-calorie malnutrition: Secondary | ICD-10-CM | POA: Diagnosis not present

## 2023-02-18 DIAGNOSIS — R2681 Unsteadiness on feet: Secondary | ICD-10-CM | POA: Diagnosis not present

## 2023-02-18 DIAGNOSIS — S52611D Displaced fracture of right ulna styloid process, subsequent encounter for closed fracture with routine healing: Secondary | ICD-10-CM | POA: Diagnosis not present

## 2023-02-18 DIAGNOSIS — R278 Other lack of coordination: Secondary | ICD-10-CM | POA: Diagnosis not present

## 2023-02-18 DIAGNOSIS — Z9181 History of falling: Secondary | ICD-10-CM | POA: Diagnosis not present

## 2023-02-18 DIAGNOSIS — I5032 Chronic diastolic (congestive) heart failure: Secondary | ICD-10-CM | POA: Diagnosis not present

## 2023-02-18 DIAGNOSIS — M6281 Muscle weakness (generalized): Secondary | ICD-10-CM | POA: Diagnosis not present

## 2023-02-21 ENCOUNTER — Ambulatory Visit: Payer: Medicare Other | Admitting: Cardiology

## 2023-02-21 DIAGNOSIS — R278 Other lack of coordination: Secondary | ICD-10-CM | POA: Diagnosis not present

## 2023-02-21 DIAGNOSIS — E43 Unspecified severe protein-calorie malnutrition: Secondary | ICD-10-CM | POA: Diagnosis not present

## 2023-02-21 DIAGNOSIS — R2681 Unsteadiness on feet: Secondary | ICD-10-CM | POA: Diagnosis not present

## 2023-02-21 DIAGNOSIS — M6281 Muscle weakness (generalized): Secondary | ICD-10-CM | POA: Diagnosis not present

## 2023-02-21 DIAGNOSIS — S52611D Displaced fracture of right ulna styloid process, subsequent encounter for closed fracture with routine healing: Secondary | ICD-10-CM | POA: Diagnosis not present

## 2023-02-21 DIAGNOSIS — Z9181 History of falling: Secondary | ICD-10-CM | POA: Diagnosis not present

## 2023-02-21 DIAGNOSIS — S12190D Other displaced fracture of second cervical vertebra, subsequent encounter for fracture with routine healing: Secondary | ICD-10-CM | POA: Diagnosis not present

## 2023-02-21 DIAGNOSIS — I5032 Chronic diastolic (congestive) heart failure: Secondary | ICD-10-CM | POA: Diagnosis not present

## 2023-02-25 DIAGNOSIS — S52611D Displaced fracture of right ulna styloid process, subsequent encounter for closed fracture with routine healing: Secondary | ICD-10-CM | POA: Diagnosis not present

## 2023-02-25 DIAGNOSIS — M6281 Muscle weakness (generalized): Secondary | ICD-10-CM | POA: Diagnosis not present

## 2023-02-25 DIAGNOSIS — I5032 Chronic diastolic (congestive) heart failure: Secondary | ICD-10-CM | POA: Diagnosis not present

## 2023-02-25 DIAGNOSIS — R2681 Unsteadiness on feet: Secondary | ICD-10-CM | POA: Diagnosis not present

## 2023-02-25 DIAGNOSIS — S22028D Other fracture of second thoracic vertebra, subsequent encounter for fracture with routine healing: Secondary | ICD-10-CM | POA: Diagnosis not present

## 2023-02-25 DIAGNOSIS — S12190D Other displaced fracture of second cervical vertebra, subsequent encounter for fracture with routine healing: Secondary | ICD-10-CM | POA: Diagnosis not present

## 2023-02-25 DIAGNOSIS — R278 Other lack of coordination: Secondary | ICD-10-CM | POA: Diagnosis not present

## 2023-02-25 DIAGNOSIS — E43 Unspecified severe protein-calorie malnutrition: Secondary | ICD-10-CM | POA: Diagnosis not present

## 2023-02-25 DIAGNOSIS — Z9181 History of falling: Secondary | ICD-10-CM | POA: Diagnosis not present

## 2023-02-26 DIAGNOSIS — S22028D Other fracture of second thoracic vertebra, subsequent encounter for fracture with routine healing: Secondary | ICD-10-CM | POA: Diagnosis not present

## 2023-02-26 DIAGNOSIS — S52611D Displaced fracture of right ulna styloid process, subsequent encounter for closed fracture with routine healing: Secondary | ICD-10-CM | POA: Diagnosis not present

## 2023-02-27 DIAGNOSIS — S22028D Other fracture of second thoracic vertebra, subsequent encounter for fracture with routine healing: Secondary | ICD-10-CM | POA: Diagnosis not present

## 2023-02-27 DIAGNOSIS — S52611D Displaced fracture of right ulna styloid process, subsequent encounter for closed fracture with routine healing: Secondary | ICD-10-CM | POA: Diagnosis not present

## 2023-02-28 DIAGNOSIS — S12190D Other displaced fracture of second cervical vertebra, subsequent encounter for fracture with routine healing: Secondary | ICD-10-CM | POA: Diagnosis not present

## 2023-02-28 DIAGNOSIS — R278 Other lack of coordination: Secondary | ICD-10-CM | POA: Diagnosis not present

## 2023-02-28 DIAGNOSIS — M6281 Muscle weakness (generalized): Secondary | ICD-10-CM | POA: Diagnosis not present

## 2023-02-28 DIAGNOSIS — S52611D Displaced fracture of right ulna styloid process, subsequent encounter for closed fracture with routine healing: Secondary | ICD-10-CM | POA: Diagnosis not present

## 2023-02-28 DIAGNOSIS — Z9181 History of falling: Secondary | ICD-10-CM | POA: Diagnosis not present

## 2023-02-28 DIAGNOSIS — S22028D Other fracture of second thoracic vertebra, subsequent encounter for fracture with routine healing: Secondary | ICD-10-CM | POA: Diagnosis not present

## 2023-02-28 DIAGNOSIS — I5032 Chronic diastolic (congestive) heart failure: Secondary | ICD-10-CM | POA: Diagnosis not present

## 2023-02-28 DIAGNOSIS — E43 Unspecified severe protein-calorie malnutrition: Secondary | ICD-10-CM | POA: Diagnosis not present

## 2023-02-28 DIAGNOSIS — R2681 Unsteadiness on feet: Secondary | ICD-10-CM | POA: Diagnosis not present

## 2023-03-01 DIAGNOSIS — S52611D Displaced fracture of right ulna styloid process, subsequent encounter for closed fracture with routine healing: Secondary | ICD-10-CM | POA: Diagnosis not present

## 2023-03-01 DIAGNOSIS — S22028D Other fracture of second thoracic vertebra, subsequent encounter for fracture with routine healing: Secondary | ICD-10-CM | POA: Diagnosis not present

## 2023-03-02 DIAGNOSIS — S22028D Other fracture of second thoracic vertebra, subsequent encounter for fracture with routine healing: Secondary | ICD-10-CM | POA: Diagnosis not present

## 2023-03-02 DIAGNOSIS — S52611D Displaced fracture of right ulna styloid process, subsequent encounter for closed fracture with routine healing: Secondary | ICD-10-CM | POA: Diagnosis not present

## 2023-03-03 DIAGNOSIS — S52611D Displaced fracture of right ulna styloid process, subsequent encounter for closed fracture with routine healing: Secondary | ICD-10-CM | POA: Diagnosis not present

## 2023-03-03 DIAGNOSIS — S22028D Other fracture of second thoracic vertebra, subsequent encounter for fracture with routine healing: Secondary | ICD-10-CM | POA: Diagnosis not present

## 2023-03-04 DIAGNOSIS — M6281 Muscle weakness (generalized): Secondary | ICD-10-CM | POA: Diagnosis not present

## 2023-03-04 DIAGNOSIS — S22028D Other fracture of second thoracic vertebra, subsequent encounter for fracture with routine healing: Secondary | ICD-10-CM | POA: Diagnosis not present

## 2023-03-04 DIAGNOSIS — R2681 Unsteadiness on feet: Secondary | ICD-10-CM | POA: Diagnosis not present

## 2023-03-04 DIAGNOSIS — R278 Other lack of coordination: Secondary | ICD-10-CM | POA: Diagnosis not present

## 2023-03-04 DIAGNOSIS — I5032 Chronic diastolic (congestive) heart failure: Secondary | ICD-10-CM | POA: Diagnosis not present

## 2023-03-04 DIAGNOSIS — S12190D Other displaced fracture of second cervical vertebra, subsequent encounter for fracture with routine healing: Secondary | ICD-10-CM | POA: Diagnosis not present

## 2023-03-04 DIAGNOSIS — Z9181 History of falling: Secondary | ICD-10-CM | POA: Diagnosis not present

## 2023-03-04 DIAGNOSIS — S52611D Displaced fracture of right ulna styloid process, subsequent encounter for closed fracture with routine healing: Secondary | ICD-10-CM | POA: Diagnosis not present

## 2023-03-04 DIAGNOSIS — E43 Unspecified severe protein-calorie malnutrition: Secondary | ICD-10-CM | POA: Diagnosis not present

## 2023-03-05 DIAGNOSIS — S52611D Displaced fracture of right ulna styloid process, subsequent encounter for closed fracture with routine healing: Secondary | ICD-10-CM | POA: Diagnosis not present

## 2023-03-05 DIAGNOSIS — S22028D Other fracture of second thoracic vertebra, subsequent encounter for fracture with routine healing: Secondary | ICD-10-CM | POA: Diagnosis not present

## 2023-03-06 DIAGNOSIS — S22028D Other fracture of second thoracic vertebra, subsequent encounter for fracture with routine healing: Secondary | ICD-10-CM | POA: Diagnosis not present

## 2023-03-06 DIAGNOSIS — S52611D Displaced fracture of right ulna styloid process, subsequent encounter for closed fracture with routine healing: Secondary | ICD-10-CM | POA: Diagnosis not present

## 2023-03-07 DIAGNOSIS — S12190D Other displaced fracture of second cervical vertebra, subsequent encounter for fracture with routine healing: Secondary | ICD-10-CM | POA: Diagnosis not present

## 2023-03-07 DIAGNOSIS — S22028D Other fracture of second thoracic vertebra, subsequent encounter for fracture with routine healing: Secondary | ICD-10-CM | POA: Diagnosis not present

## 2023-03-07 DIAGNOSIS — E43 Unspecified severe protein-calorie malnutrition: Secondary | ICD-10-CM | POA: Diagnosis not present

## 2023-03-07 DIAGNOSIS — R2681 Unsteadiness on feet: Secondary | ICD-10-CM | POA: Diagnosis not present

## 2023-03-07 DIAGNOSIS — I5032 Chronic diastolic (congestive) heart failure: Secondary | ICD-10-CM | POA: Diagnosis not present

## 2023-03-07 DIAGNOSIS — M6281 Muscle weakness (generalized): Secondary | ICD-10-CM | POA: Diagnosis not present

## 2023-03-07 DIAGNOSIS — S52611D Displaced fracture of right ulna styloid process, subsequent encounter for closed fracture with routine healing: Secondary | ICD-10-CM | POA: Diagnosis not present

## 2023-03-07 DIAGNOSIS — Z9181 History of falling: Secondary | ICD-10-CM | POA: Diagnosis not present

## 2023-03-07 DIAGNOSIS — R278 Other lack of coordination: Secondary | ICD-10-CM | POA: Diagnosis not present

## 2023-03-08 DIAGNOSIS — S52611D Displaced fracture of right ulna styloid process, subsequent encounter for closed fracture with routine healing: Secondary | ICD-10-CM | POA: Diagnosis not present

## 2023-03-08 DIAGNOSIS — S22028D Other fracture of second thoracic vertebra, subsequent encounter for fracture with routine healing: Secondary | ICD-10-CM | POA: Diagnosis not present

## 2023-03-09 ENCOUNTER — Encounter (HOSPITAL_COMMUNITY): Payer: Self-pay

## 2023-03-09 ENCOUNTER — Emergency Department (HOSPITAL_COMMUNITY): Payer: Medicare Other

## 2023-03-09 ENCOUNTER — Emergency Department (HOSPITAL_COMMUNITY)
Admission: EM | Admit: 2023-03-09 | Discharge: 2023-03-09 | Disposition: A | Discharge status: Home / Self Care | Payer: Medicare Other | Attending: Emergency Medicine | Admitting: Emergency Medicine

## 2023-03-09 ENCOUNTER — Other Ambulatory Visit: Payer: Self-pay

## 2023-03-09 DIAGNOSIS — S51011A Laceration without foreign body of right elbow, initial encounter: Secondary | ICD-10-CM | POA: Diagnosis not present

## 2023-03-09 DIAGNOSIS — M8588 Other specified disorders of bone density and structure, other site: Secondary | ICD-10-CM | POA: Diagnosis not present

## 2023-03-09 DIAGNOSIS — S22028D Other fracture of second thoracic vertebra, subsequent encounter for fracture with routine healing: Secondary | ICD-10-CM | POA: Diagnosis not present

## 2023-03-09 DIAGNOSIS — J984 Other disorders of lung: Secondary | ICD-10-CM | POA: Diagnosis not present

## 2023-03-09 DIAGNOSIS — S0990XA Unspecified injury of head, initial encounter: Secondary | ICD-10-CM

## 2023-03-09 DIAGNOSIS — S299XXA Unspecified injury of thorax, initial encounter: Secondary | ICD-10-CM | POA: Diagnosis not present

## 2023-03-09 DIAGNOSIS — Z7902 Long term (current) use of antithrombotics/antiplatelets: Secondary | ICD-10-CM | POA: Diagnosis not present

## 2023-03-09 DIAGNOSIS — M542 Cervicalgia: Secondary | ICD-10-CM | POA: Diagnosis present

## 2023-03-09 DIAGNOSIS — S3993XA Unspecified injury of pelvis, initial encounter: Secondary | ICD-10-CM | POA: Diagnosis not present

## 2023-03-09 DIAGNOSIS — R6889 Other general symptoms and signs: Secondary | ICD-10-CM | POA: Diagnosis not present

## 2023-03-09 DIAGNOSIS — R9082 White matter disease, unspecified: Secondary | ICD-10-CM | POA: Diagnosis not present

## 2023-03-09 DIAGNOSIS — S61411A Laceration without foreign body of right hand, initial encounter: Secondary | ICD-10-CM | POA: Insufficient documentation

## 2023-03-09 DIAGNOSIS — S2242XA Multiple fractures of ribs, left side, initial encounter for closed fracture: Secondary | ICD-10-CM | POA: Diagnosis not present

## 2023-03-09 DIAGNOSIS — Z743 Need for continuous supervision: Secondary | ICD-10-CM | POA: Diagnosis not present

## 2023-03-09 DIAGNOSIS — W228XXA Striking against or struck by other objects, initial encounter: Secondary | ICD-10-CM | POA: Diagnosis not present

## 2023-03-09 DIAGNOSIS — M40204 Unspecified kyphosis, thoracic region: Secondary | ICD-10-CM | POA: Diagnosis not present

## 2023-03-09 DIAGNOSIS — S51012A Laceration without foreign body of left elbow, initial encounter: Secondary | ICD-10-CM | POA: Diagnosis not present

## 2023-03-09 DIAGNOSIS — I700 Atherosclerosis of aorta: Secondary | ICD-10-CM | POA: Diagnosis not present

## 2023-03-09 DIAGNOSIS — S51019A Laceration without foreign body of unspecified elbow, initial encounter: Secondary | ICD-10-CM

## 2023-03-09 DIAGNOSIS — W19XXXA Unspecified fall, initial encounter: Secondary | ICD-10-CM

## 2023-03-09 DIAGNOSIS — S161XXA Strain of muscle, fascia and tendon at neck level, initial encounter: Secondary | ICD-10-CM | POA: Diagnosis not present

## 2023-03-09 DIAGNOSIS — S61419A Laceration without foreign body of unspecified hand, initial encounter: Secondary | ICD-10-CM

## 2023-03-09 DIAGNOSIS — S52611D Displaced fracture of right ulna styloid process, subsequent encounter for closed fracture with routine healing: Secondary | ICD-10-CM | POA: Diagnosis not present

## 2023-03-09 DIAGNOSIS — S12600A Unspecified displaced fracture of seventh cervical vertebra, initial encounter for closed fracture: Secondary | ICD-10-CM | POA: Diagnosis not present

## 2023-03-09 DIAGNOSIS — R918 Other nonspecific abnormal finding of lung field: Secondary | ICD-10-CM | POA: Diagnosis not present

## 2023-03-09 DIAGNOSIS — I672 Cerebral atherosclerosis: Secondary | ICD-10-CM | POA: Diagnosis not present

## 2023-03-09 DIAGNOSIS — M8580 Other specified disorders of bone density and structure, unspecified site: Secondary | ICD-10-CM | POA: Diagnosis not present

## 2023-03-09 MED ORDER — ACETAMINOPHEN 325 MG PO TABS
650.0000 mg | ORAL_TABLET | Freq: Once | ORAL | Status: AC
  Administered 2023-03-09: 650 mg via ORAL
  Filled 2023-03-09: qty 2

## 2023-03-09 NOTE — Progress Notes (Signed)
Orthopedic Tech Progress Note Patient Details:  [name] 03-04-1930 [id number]  Level 2 trauma   Patient ID: [name], female   DOB: 1929/09/01, 89 y.o.   MRN: [id number]  Donald Pore 03/09/2023, 4:07 AM

## 2023-03-09 NOTE — ED Notes (Addendum)
Patient transported to CT with Rn x 2 for cervical spine support.

## 2023-03-09 NOTE — Progress Notes (Signed)
   03/09/23 0500  Spiritual Encounters  Type of Visit Initial  Care provided to: Patient  Conversation partners present during encounter Nurse  Referral source Trauma page  Reason for visit Trauma  OnCall Visit Yes   Ch responded to trauma level II page. There was no family at bedside. Patient was en route CT. Chaplain remains available when needed.

## 2023-03-09 NOTE — ED Notes (Signed)
Discharge instructions reviewed with pt and transport.   Attempted report to facility numerous times.   Pt has DNR paperwork in possession.   Alert, at baseline and displays no signs of distress.

## 2023-03-09 NOTE — ED Provider Notes (Signed)
[place] EMERGENCY DEPARTMENT AT [facility] Provider Note   CSN: [id number] Arrival date & time: 03/09/23  4098     History  Chief Complaint  Patient presents with   [name]    [name] is a 89 y.o. female.  Brought to the emergency department for evaluation after a fall.  Patient was trying to get out of bed, hide her legs get tangled up and fell.  She reports that she hit the right side of her head on a wall and then slid down to the ground.  Complaining of headache, neck pain, upper back pain.  EMS reports multiple skin tears on the right arm.       Home Medications Prior to Admission medications   Medication Sig Start Date End Date Taking? Authorizing Provider  acetaminophen (TYLENOL) 500 MG tablet Take 1,000 mg by mouth every 8 (eight) hours as needed (for pain).    [provider]  alendronate (FOSAMAX) 70 MG tablet Take 1 tablet (70 mg total) by mouth once a week. Take with a full glass of water on an empty stomach. 12/19/21   Wanda Plump, MD  calcium carbonate (TUMS - DOSED IN MG ELEMENTAL CALCIUM) 500 MG chewable tablet Chew 1 tablet (200 mg of elemental calcium total) by mouth 3 (three) times daily as needed for indigestion or heartburn. 07/28/22   Dorcas Carrow, MD  clopidogrel (PLAVIX) 75 MG tablet Take 1 tablet (75 mg total) by mouth daily. 03/01/22   Arty Baumgartner, NP  feeding supplement (ENSURE ENLIVE / ENSURE PLUS) LIQD Take 237 mLs by mouth daily. 02/13/23   Burnadette Pop, MD  furosemide (LASIX) 20 MG tablet Take 20 mg by mouth daily. 01/29/23   [provider]  hydrALAZINE (APRESOLINE) 50 MG tablet Take 1 tablet (50 mg total) by mouth every 8 (eight) hours as needed (BP>160/90 MMHG.). 02/12/23 03/14/23  Willeen Niece, MD  lidocaine (LIDODERM) 5 % Place 1 patch onto the skin daily. Remove & Discard patch within 12 hours or as directed by MD Patient taking differently: Place 1 patch onto the skin daily. Remove & Discard patch  within 12 hours or as directed by MD Apply to right shoulder and left rib area 10/10/22   Almon Hercules, MD  LORazepam (ATIVAN) 0.5 MG tablet Take 0.25 mg by mouth 2 (two) times daily.    [provider]  nitroGLYCERIN (NITROSTAT) 0.4 MG SL tablet Place 1 tablet (0.4 mg total) under the tongue every 5 (five) minutes x 3 doses as needed for chest pain. 02/28/22   Arty Baumgartner, NP  ondansetron (ZOFRAN-ODT) 4 MG disintegrating tablet Take 4 mg by mouth every 8 (eight) hours as needed for nausea or vomiting. 08/24/22   [provider]  pantoprazole (PROTONIX) 40 MG tablet Take 1 tablet (40 mg total) by mouth at bedtime. Patient taking differently: Take 40 mg by mouth 2 (two) times daily. 02/28/22   Arty Baumgartner, NP  polyethylene glycol (MIRALAX) 17 g packet Take 17 g by mouth daily. 02/13/23   Burnadette Pop, MD  rosuvastatin (CRESTOR) 20 MG tablet Take 1 tablet (20 mg total) by mouth daily. Patient taking differently: Take 20 mg by mouth at bedtime. 03/01/22   Arty Baumgartner, NP  senna (SENOKOT) 8.6 MG TABS tablet Take 1 tablet (8.6 mg total) by mouth 2 (two) times daily. 02/13/23   Burnadette Pop, MD  sertraline (ZOLOFT) 25 MG tablet Take 25 mg by mouth at bedtime. 06/18/22  [provider]  UNABLE TO FIND Take 120 mLs by mouth in the morning, at noon, and at bedtime. Med Name: Med pass    [provider]  UNABLE TO FIND Take 1 Dose by mouth 2 (two) times daily with a meal. Med Name: Manufacturing engineer, Historical, MD      Allergies    Penicillins, Valsartan, Atorvastatin, Carvedilol, Ezetimibe, Fluvastatin sodium, Magnesium hydroxide, Meloxicam, Pneumovax [pneumococcal polysaccharide vaccine], Quinapril hcl, Simvastatin, Topamax [topiramate], and Vit d-vit e-safflower oil    Review of Systems   Review of Systems  Physical Exam Updated Vital Signs BP (!) 180/98   Pulse 83   Temp 97.7 F (36.5 C) (Oral)   Resp 18   Ht 5\' 5"  (1.651 m)   Wt  49.9 kg   SpO2 95%   BMI 18.30 kg/m  Physical Exam Vitals and nursing note reviewed.  Constitutional:      General: She is not in acute distress.    Appearance: She is well-developed.  HENT:     Head: Normocephalic and atraumatic.     Mouth/Throat:     Mouth: Mucous membranes are moist.  Eyes:     General: Vision grossly intact. Gaze aligned appropriately.     Extraocular Movements: Extraocular movements intact.     Conjunctiva/sclera: Conjunctivae normal.  Cardiovascular:     Rate and Rhythm: Normal rate and regular rhythm.     Pulses: Normal pulses.     Heart sounds: Normal heart sounds, S1 normal and S2 normal. No murmur heard.    No friction rub. No gallop.  Pulmonary:     Effort: Pulmonary effort is normal. No respiratory distress.     Breath sounds: Normal breath sounds.  Abdominal:     General: Bowel sounds are normal.     Palpations: Abdomen is soft.     Tenderness: There is no abdominal tenderness. There is no guarding or rebound.     Hernia: No hernia is present.  Musculoskeletal:        General: No swelling.     Cervical back: Full passive range of motion without pain, normal range of motion and neck supple. Tenderness present. No spinous process tenderness or muscular tenderness. Normal range of motion.     Thoracic back: Tenderness present.       Back:     Right lower leg: No edema.     Left lower leg: No edema.  Skin:    General: Skin is warm and dry.     Capillary Refill: Capillary refill takes less than 2 seconds.     Findings: No ecchymosis, erythema, rash or wound.     Comments: Skin tear right hand/wrist, right elbow  Neurological:     General: No focal deficit present.     Mental Status: She is alert and oriented to person, place, and time.     GCS: GCS eye subscore is 4. GCS verbal subscore is 5. GCS motor subscore is 6.     Cranial Nerves: Cranial nerves 2-12 are intact.     Sensory: Sensation is intact.     Motor: Motor function is intact.      Coordination: Coordination is intact.  Psychiatric:        Attention and Perception: Attention normal.        Mood and Affect: Mood normal.        Speech: Speech normal.        Behavior: Behavior normal.  ED Results / Procedures / Treatments   Labs (all labs ordered are listed, but only abnormal results are displayed) Labs Reviewed - No data to display  EKG None  Radiology CT Thoracic Spine Wo Contrast  Result Date: 03/09/2023 CLINICAL DATA:  89 year old female struck head and fell. EXAM: CT THORACIC SPINE WITHOUT CONTRAST TECHNIQUE: Multidetector CT images of the thoracic were obtained using the standard protocol without intravenous contrast. RADIATION DOSE REDUCTION: This exam was performed according to the departmental dose-optimization program which includes automated exposure control, adjustment of the mA and/or kV according to patient size and/or use of iterative reconstruction technique. COMPARISON:  Thoracic spine MRI 02/04/2023 and earlier. FINDINGS: Limited cervical spine imaging: Cervical spine CT today is reported separately. Thoracic spine segmentation: Small C7 cervical ribs, and hypoplastic ribs at T12. Same numbering system used on the comparisons. Alignment:  Exaggerated thoracic kyphosis is stable from last month. Vertebrae: Diffuse osteopenia and thoracic compression fractures. Stable mild to moderate T1 compression. Moderate to severe T2 compression is new since June, stable from the MRI last month. Severe T3 compression is stable. Previously augmented T4 and T5 compression fractures, T5 newly augmented since last month. T6 appears stable and intact. Severe T7 through T10 compression fractures are stable, T8 through T10 previously augmented. T11 appears stable with mild inferior endplate compression. Chronic T12 and L1 augmented compression fractures are stable. Extensive chronic posterior left rib fractures partially visible and appears stable. No new osseous abnormality  identified. Paraspinal and other soft tissues: Calcified aortic atherosclerosis. Calcified post granulomatous lymph nodes. No pericardial or pleural effusion. Mild dependent lung base atelectasis. Moderate chronic left lower mediastinal gastric hiatal hernia. Grossly stable visible noncontrast upper abdominal viscera including small splenic calcified granulomas. Disc levels: Stable from the MRI last month. IMPRESSION: 1. No acute traumatic injury identified in the Thoracic Spine. 2. Stable thoracic spine since last month. Osteopenia with diffuse thoracic compression fractures, 6 levels previously augmented, including T5 since 02/04/2023. Only T6 and T11 vertebral height is relatively maintained. Chronic left rib fractures. 3.  Aortic Atherosclerosis (ICD10-I70.0). Electronically Signed   By: Odessa Fleming M.D.   On: 03/09/2023 04:59   CT CERVICAL SPINE WO CONTRAST  Result Date: 03/09/2023 CLINICAL DATA:  89 year old female struck head and fell. EXAM: CT CERVICAL SPINE WITHOUT CONTRAST TECHNIQUE: Multidetector CT imaging of the cervical spine was performed without intravenous contrast. Multiplanar CT image reconstructions were also generated. RADIATION DOSE REDUCTION: This exam was performed according to the departmental dose-optimization program which includes automated exposure control, adjustment of the mA and/or kV according to patient size and/or use of iterative reconstruction technique. COMPARISON:  Head and thoracic spine CT today. Cervical spine CT 02/03/2023. FINDINGS: Alignment: Stable cervical lordosis. Stable cervicothoracic junction alignment. Bilateral posterior element alignment is within normal limits. Skull base and vertebrae: Osteopenia. Congenital incomplete ossification of the posterior C1 ring. Visualized skull base is intact. No atlanto-occipital dissociation. C1 and C2 appear stable and aligned. Chronic heterogeneous C7 compression fracture is stable. No acute osseous abnormality identified.  Soft tissues and spinal canal: No prevertebral fluid or swelling. No visible canal hematoma. Stable and negative noncontrast neck soft tissues aside from calcified cervical carotid atherosclerosis. Disc levels: Generally mild for age cervical spine degeneration and capacious CT appearance of the spinal canal. Stable since September. Upper chest: Chronic upper thoracic compression fractures, thoracic spine is reported separately. Lung apices are better aerated since September. IMPRESSION: 1. No acute traumatic injury identified in the cervical spine. 2. Chronic C7 compression fracture.  Thoracic spine reported separately. Electronically Signed   By: Odessa Fleming M.D.   On: 03/09/2023 04:53   CT HEAD WO CONTRAST ( )  Result Date: 03/09/2023 CLINICAL DATA:  89 year old female struck head and fell. EXAM: CT HEAD WITHOUT CONTRAST TECHNIQUE: Contiguous axial images were obtained from the base of the skull through the vertex without intravenous contrast. RADIATION DOSE REDUCTION: This exam was performed according to the departmental dose-optimization program which includes automated exposure control, adjustment of the mA and/or kV according to patient size and/or use of iterative reconstruction technique. COMPARISON:  Brain MRI 10/07/2021. Head CT 02/03/2023. FINDINGS: Brain: Stable cerebral volume. No midline shift, mass effect, or evidence of intracranial mass lesion. No ventriculomegaly. No acute intracranial hemorrhage identified. Advanced chronic white matter disease and chronic lacunar infarct of the right thalamus. Small chronic posterior left cerebellar infarct. Stable gray-white matter differentiation throughout the brain. No cortically based acute infarct identified. Vascular: Tortuous and calcified intracranial arteries. No suspicious intracranial vascular hyperdensity. Skull: Stable.  No acute osseous abnormality identified. Sinuses/Orbits: Visualized paranasal sinuses and mastoids are stable and well aerated.  Other: No discrete orbit or scalp soft tissue injury identified. IMPRESSION: 1. No acute intracranial abnormality or acute traumatic injury identified. 2. Stable non contrast CT appearance of advanced chronic small vessel disease. Electronically Signed   By: Odessa Fleming M.D.   On: 03/09/2023 04:49   DG Pelvis Portable  Result Date: 03/09/2023 CLINICAL DATA:  Trauma, fall. EXAM: PORTABLE PELVIS 1-2 VIEWS COMPARISON:  02/03/2023. FINDINGS: There is no evidence of acute pelvic fracture or diastasis. Fixation hardware is noted in the proximal femurs bilaterally. There is no dislocation at the hips. An old healed fracture is noted at the inferior pubic ramus on the left. IMPRESSION: No acute fracture or dislocation. Electronically Signed   By: Thornell Sartorius M.D.   On: 03/09/2023 04:21   DG Chest Portable 1 View  Result Date: 03/09/2023 CLINICAL DATA:  Trauma, fall. EXAM: PORTABLE CHEST 1 VIEW COMPARISON:  02/03/2023. FINDINGS: The heart size and mediastinal contours are within normal limits. There is atherosclerotic calcification of the aorta. Chronic interstitial prominence is present bilaterally. Retrocardiac airspace disease is noted on the left. No effusion or pneumothorax. Multilevel vertebral body augmentation is noted in the thoracic spine. Surgical clips are noted in the left axilla. IMPRESSION: Retrocardiac airspace disease on the left, possible atelectasis or infiltrate. Electronically Signed   By: Thornell Sartorius M.D.   On: 03/09/2023 04:19    Procedures Procedures    Medications Ordered in ED Medications  acetaminophen (TYLENOL) tablet 650 mg (has no administration in time range)    ED Course/ Medical Decision Making/ A&P                                 Medical Decision Making Amount and/or Complexity of Data Reviewed Radiology: ordered.   Presents after a fall.  Patient reports trying to get out of bed when she fell, fell up against a wall and then to the ground.  Patient  complaining of neck pain at arrival.  CT head, cervical spine performed.  She was additionally complaining of pain in the upper thoracic area, CT thoracic spine performed.  Patient has chronic and severe multilevel compression fractures throughout the spine.  There does not appear to be any significant acute changes on her spine compared to MRI from 1 month ago.  CT head without intracranial or skull injury.  Final Clinical Impression(s) / ED Diagnoses Final diagnoses:  Minor head injury, initial encounter  Cervical strain, acute, initial encounter  Fall, initial encounter    Rx / DC Orders ED Discharge Orders     None         Brissia Delisa, Canary Brim, MD 03/09/23 [phone]

## 2023-03-09 NOTE — ED Triage Notes (Addendum)
Arrives EMS from [facility].   Reported that patient was getting up to close  bedroom door and hit her head on the side of the wall and then slid to the ground.   Compliant on Plavix.   C/o mid thoracic and cervical tenderness. Recent compression fracture of spine.

## 2023-03-11 DIAGNOSIS — M6281 Muscle weakness (generalized): Secondary | ICD-10-CM | POA: Diagnosis not present

## 2023-03-11 DIAGNOSIS — I5032 Chronic diastolic (congestive) heart failure: Secondary | ICD-10-CM | POA: Diagnosis not present

## 2023-03-11 DIAGNOSIS — Z9181 History of falling: Secondary | ICD-10-CM | POA: Diagnosis not present

## 2023-03-11 DIAGNOSIS — S52611D Displaced fracture of right ulna styloid process, subsequent encounter for closed fracture with routine healing: Secondary | ICD-10-CM | POA: Diagnosis not present

## 2023-03-11 DIAGNOSIS — R2681 Unsteadiness on feet: Secondary | ICD-10-CM | POA: Diagnosis not present

## 2023-03-11 DIAGNOSIS — R278 Other lack of coordination: Secondary | ICD-10-CM | POA: Diagnosis not present

## 2023-03-11 DIAGNOSIS — S12190D Other displaced fracture of second cervical vertebra, subsequent encounter for fracture with routine healing: Secondary | ICD-10-CM | POA: Diagnosis not present

## 2023-03-11 DIAGNOSIS — W19XXXA Unspecified fall, initial encounter: Secondary | ICD-10-CM | POA: Diagnosis not present

## 2023-03-11 DIAGNOSIS — S22028D Other fracture of second thoracic vertebra, subsequent encounter for fracture with routine healing: Secondary | ICD-10-CM | POA: Diagnosis not present

## 2023-03-11 DIAGNOSIS — E43 Unspecified severe protein-calorie malnutrition: Secondary | ICD-10-CM | POA: Diagnosis not present

## 2023-03-11 DIAGNOSIS — I10 Essential (primary) hypertension: Secondary | ICD-10-CM | POA: Diagnosis not present

## 2023-03-12 DIAGNOSIS — S52611D Displaced fracture of right ulna styloid process, subsequent encounter for closed fracture with routine healing: Secondary | ICD-10-CM | POA: Diagnosis not present

## 2023-03-12 DIAGNOSIS — S22028D Other fracture of second thoracic vertebra, subsequent encounter for fracture with routine healing: Secondary | ICD-10-CM | POA: Diagnosis not present

## 2023-03-13 DIAGNOSIS — S52611D Displaced fracture of right ulna styloid process, subsequent encounter for closed fracture with routine healing: Secondary | ICD-10-CM | POA: Diagnosis not present

## 2023-03-13 DIAGNOSIS — S22028D Other fracture of second thoracic vertebra, subsequent encounter for fracture with routine healing: Secondary | ICD-10-CM | POA: Diagnosis not present

## 2023-03-14 DIAGNOSIS — S22028D Other fracture of second thoracic vertebra, subsequent encounter for fracture with routine healing: Secondary | ICD-10-CM | POA: Diagnosis not present

## 2023-03-14 DIAGNOSIS — S52611D Displaced fracture of right ulna styloid process, subsequent encounter for closed fracture with routine healing: Secondary | ICD-10-CM | POA: Diagnosis not present

## 2023-03-15 DIAGNOSIS — R2681 Unsteadiness on feet: Secondary | ICD-10-CM | POA: Diagnosis not present

## 2023-03-15 DIAGNOSIS — S12190D Other displaced fracture of second cervical vertebra, subsequent encounter for fracture with routine healing: Secondary | ICD-10-CM | POA: Diagnosis not present

## 2023-03-15 DIAGNOSIS — S22028D Other fracture of second thoracic vertebra, subsequent encounter for fracture with routine healing: Secondary | ICD-10-CM | POA: Diagnosis not present

## 2023-03-15 DIAGNOSIS — R2689 Other abnormalities of gait and mobility: Secondary | ICD-10-CM | POA: Diagnosis not present

## 2023-03-15 DIAGNOSIS — M6281 Muscle weakness (generalized): Secondary | ICD-10-CM | POA: Diagnosis not present

## 2023-03-17 DIAGNOSIS — S12190D Other displaced fracture of second cervical vertebra, subsequent encounter for fracture with routine healing: Secondary | ICD-10-CM | POA: Diagnosis not present

## 2023-03-17 DIAGNOSIS — M6281 Muscle weakness (generalized): Secondary | ICD-10-CM | POA: Diagnosis not present

## 2023-03-17 DIAGNOSIS — R2689 Other abnormalities of gait and mobility: Secondary | ICD-10-CM | POA: Diagnosis not present

## 2023-03-17 DIAGNOSIS — R2681 Unsteadiness on feet: Secondary | ICD-10-CM | POA: Diagnosis not present

## 2023-03-17 DIAGNOSIS — S22028D Other fracture of second thoracic vertebra, subsequent encounter for fracture with routine healing: Secondary | ICD-10-CM | POA: Diagnosis not present

## 2023-03-18 DIAGNOSIS — S12190D Other displaced fracture of second cervical vertebra, subsequent encounter for fracture with routine healing: Secondary | ICD-10-CM | POA: Diagnosis not present

## 2023-03-18 DIAGNOSIS — S52611D Displaced fracture of right ulna styloid process, subsequent encounter for closed fracture with routine healing: Secondary | ICD-10-CM | POA: Diagnosis not present

## 2023-03-18 DIAGNOSIS — M6281 Muscle weakness (generalized): Secondary | ICD-10-CM | POA: Diagnosis not present

## 2023-03-18 DIAGNOSIS — R278 Other lack of coordination: Secondary | ICD-10-CM | POA: Diagnosis not present

## 2023-03-18 DIAGNOSIS — R2681 Unsteadiness on feet: Secondary | ICD-10-CM | POA: Diagnosis not present

## 2023-03-18 DIAGNOSIS — I5032 Chronic diastolic (congestive) heart failure: Secondary | ICD-10-CM | POA: Diagnosis not present

## 2023-03-18 DIAGNOSIS — R2689 Other abnormalities of gait and mobility: Secondary | ICD-10-CM | POA: Diagnosis not present

## 2023-03-18 DIAGNOSIS — E43 Unspecified severe protein-calorie malnutrition: Secondary | ICD-10-CM | POA: Diagnosis not present

## 2023-03-18 DIAGNOSIS — S22028D Other fracture of second thoracic vertebra, subsequent encounter for fracture with routine healing: Secondary | ICD-10-CM | POA: Diagnosis not present

## 2023-03-18 DIAGNOSIS — Z9181 History of falling: Secondary | ICD-10-CM | POA: Diagnosis not present

## 2023-03-19 DIAGNOSIS — E559 Vitamin D deficiency, unspecified: Secondary | ICD-10-CM | POA: Diagnosis not present

## 2023-03-19 DIAGNOSIS — Z79899 Other long term (current) drug therapy: Secondary | ICD-10-CM | POA: Diagnosis not present

## 2023-04-17 DIAGNOSIS — I10 Essential (primary) hypertension: Secondary | ICD-10-CM | POA: Diagnosis not present

## 2023-04-17 DIAGNOSIS — I2510 Atherosclerotic heart disease of native coronary artery without angina pectoris: Secondary | ICD-10-CM | POA: Diagnosis not present

## 2023-04-17 DIAGNOSIS — I509 Heart failure, unspecified: Secondary | ICD-10-CM | POA: Diagnosis not present

## 2023-04-17 DIAGNOSIS — K219 Gastro-esophageal reflux disease without esophagitis: Secondary | ICD-10-CM | POA: Diagnosis not present

## 2023-04-19 IMAGING — CT CT HEAD W/O CM
3 series · 16 of 47 positions shown, 19 images · non-contrast
Comparison: Head CT 02/19/2020

CLINICAL DATA: Headache, new or worsening, positional (Age 19-49y)

EXAM:
CT HEAD WITHOUT CONTRAST
TECHNIQUE: Contiguous axial images were obtained from the base of the skull
through the vertex without intravenous contrast.

[Series 2: head wo · axial · 0.42mm/px · z∈[-26,+104]mm · 10 of 32 slices shown, 13 images]
[im 3/32  brain]
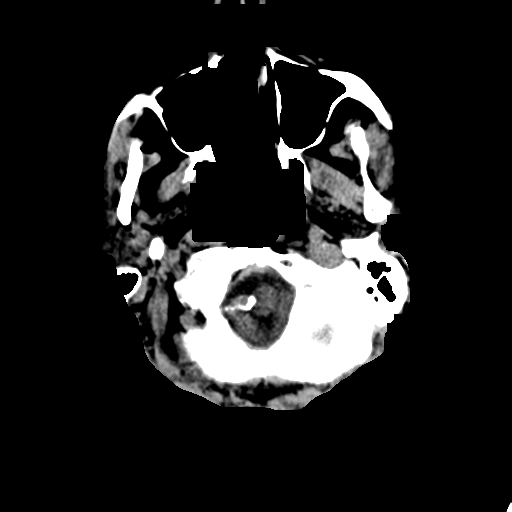
[im 3/32  bone]
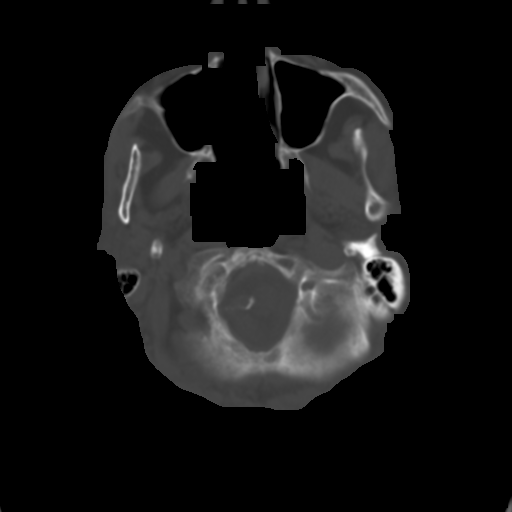
[im 6/32  brain]
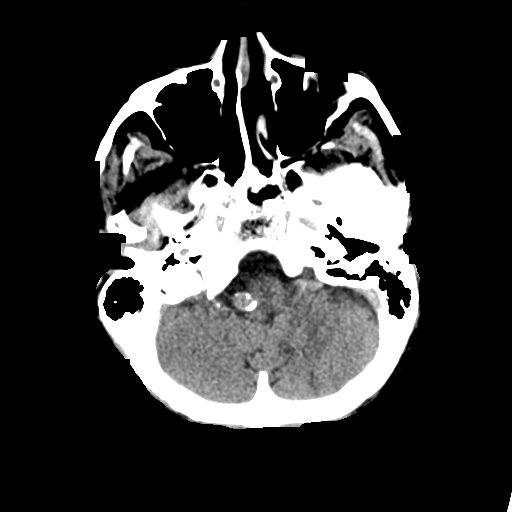
[im 9/32  brain]
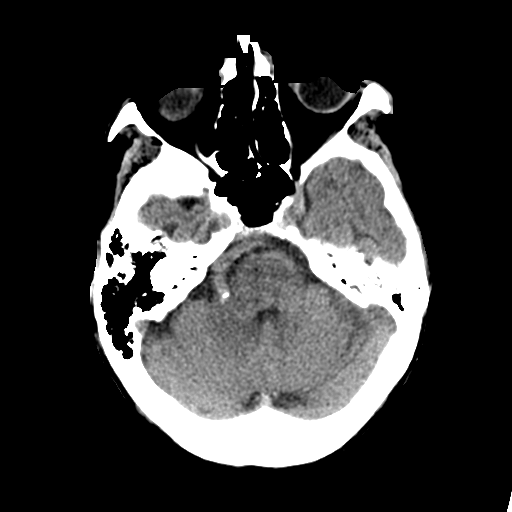
[im 11/32  brain]
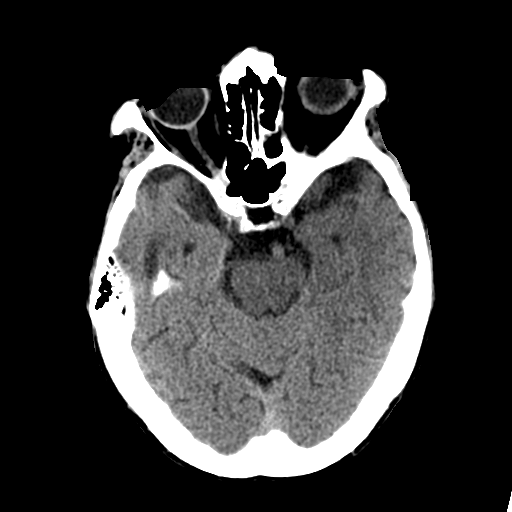
[im 14/32  brain]
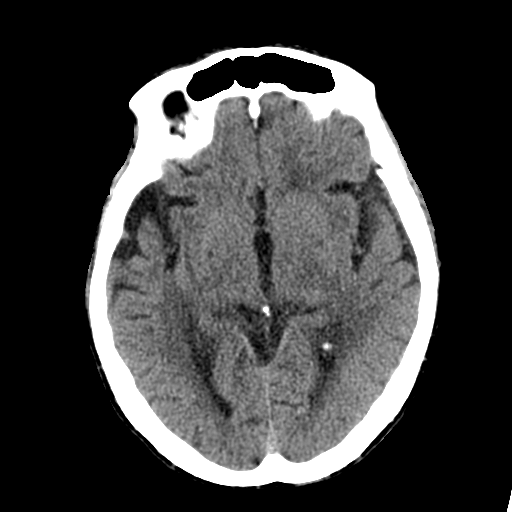
[im 14/32  bone]
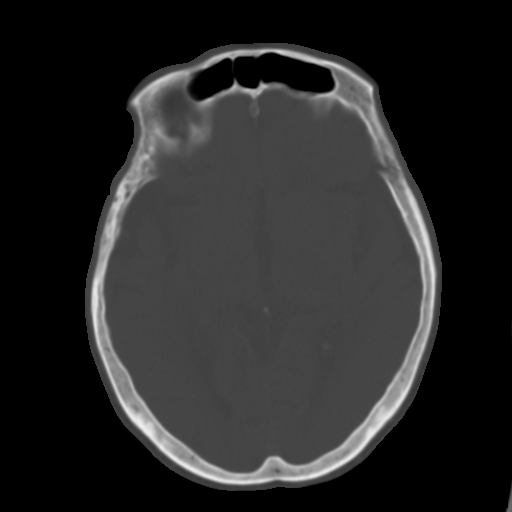
[im 18/32  brain]
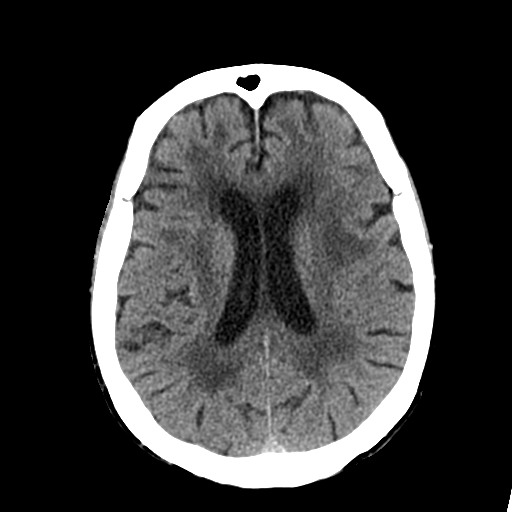
[im 21/32  brain]
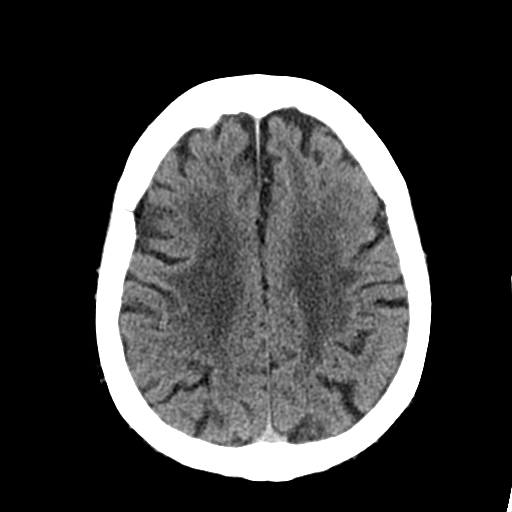
[im 24/32  brain]
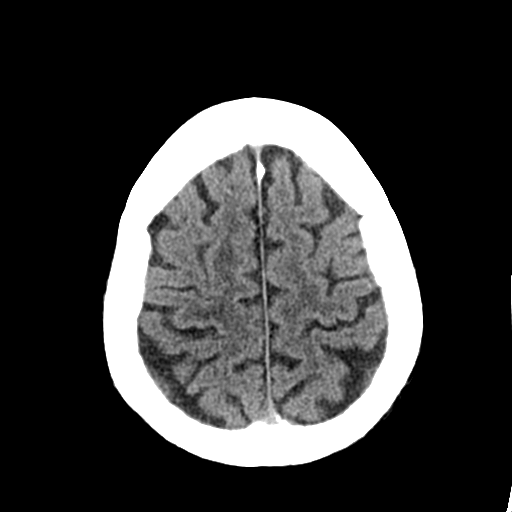
[im 26/32  brain]
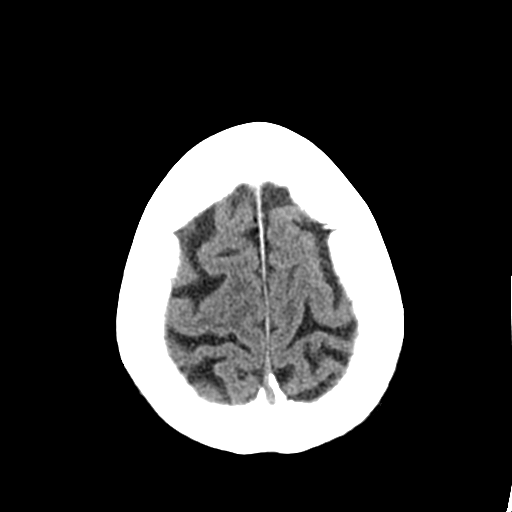
[im 26/32  bone]
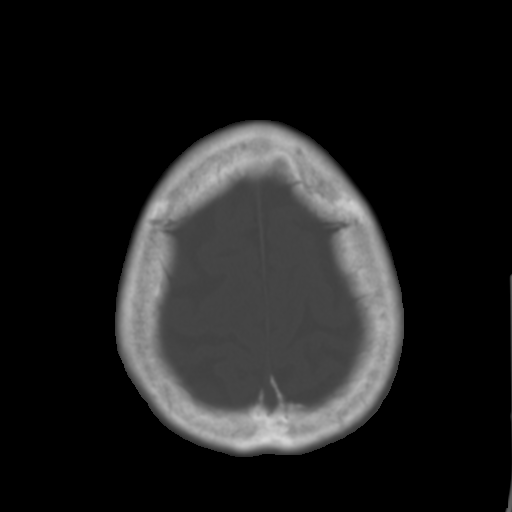
[im 29/32  brain]
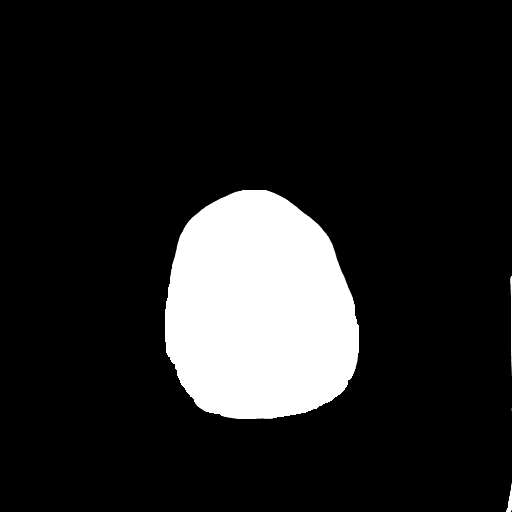

[Series 5: coronal soft tissue · coronal · 0.32mm/px · 3 of 66 slices shown]
[im 25/66  brain]
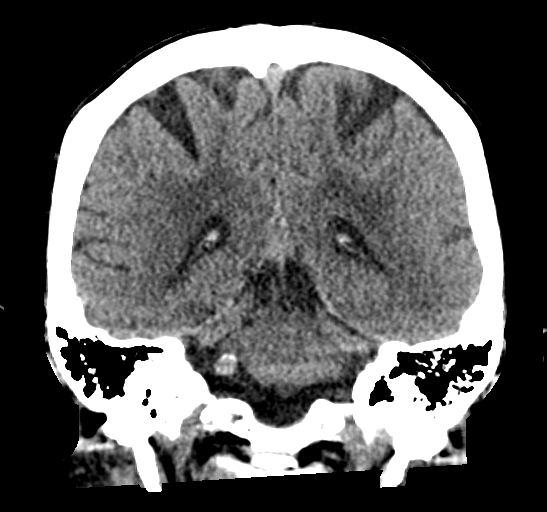
[im 30/66  brain]
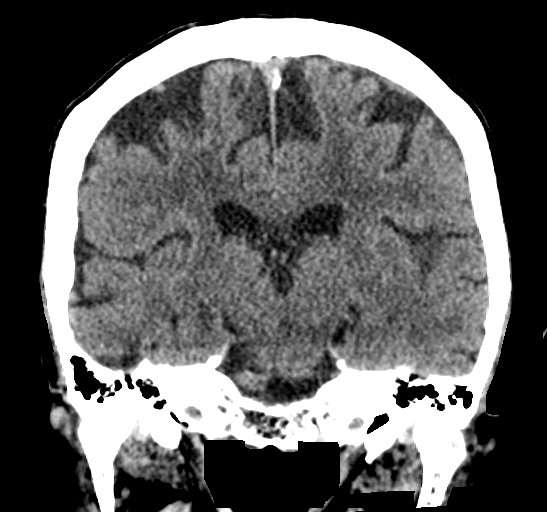
[im 36/66  brain]
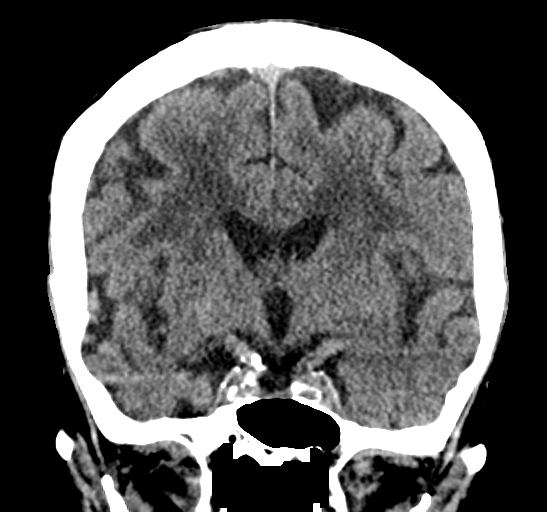

[Series 6: sagittal soft tissue · sagittal · 0.32mm/px · 3 of 56 slices shown]
[im 19/56  brain]
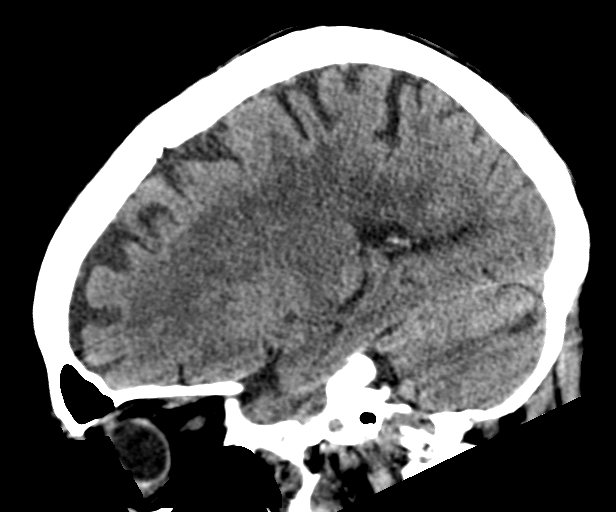
[im 28/56  brain]
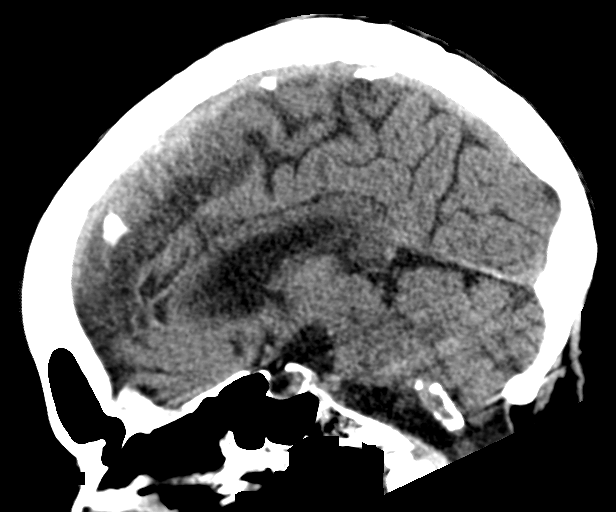
[im 37/56  brain]
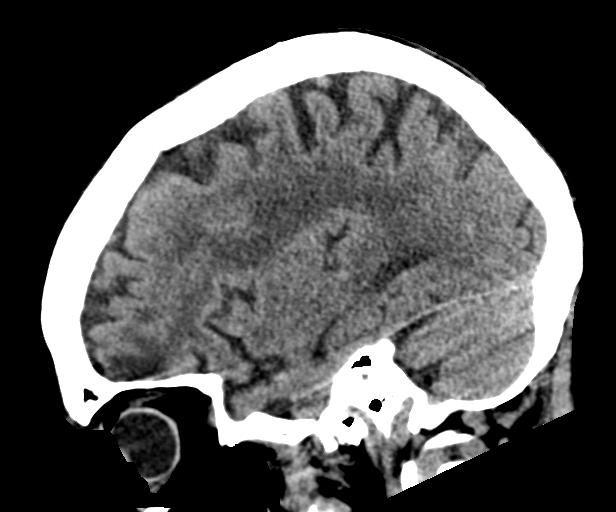

[16 of 47 positions shown; findings below may reference images not displayed]

FINDINGS: Brain: Stable degree of atrophy. Periventricular and deep white
matter hypodensity consistent with chronic small vessel ischemia,
significantly changed. Small right frontal encephalomalacia again
seen. Remote lacunar infarct in the right basal ganglia again seen.
No hemorrhage. No evidence of acute ischemia or infarct. No midline
shift or hydrocephalus.

Vascular: Skull base atherosclerosis.  Dolichoectasia.

Skull: Prior left occipital fracture partially healed. No acute
findings.

Sinuses/Orbits: Paranasal sinuses and mastoid air cells are clear.
The visualized orbits are unremarkable.

Other: None.
IMPRESSION: 1. No acute intracranial abnormality.
2. Stable atrophy, chronic small vessel ischemia, and remote
infarcts.

## 2023-04-19 IMAGING — CT CT ABD-PELV W/O CM
2 of 4 series · 15 of 46 positions shown, 17 images · non-contrast
Comparison: CT spine 07/04/2020, CT pelvis 11/04/2018, CT
angiography 09/13/2019

CLINICAL DATA: Left lower quadrant pain, low back tenderness, with
a new compression fracture.

EXAM:
CT ABDOMEN AND PELVIS WITHOUT CONTRAST
TECHNIQUE: Multidetector CT imaging of the abdomen and pelvis was performed
following the standard protocol without IV contrast.

[Series 3: axial st · axial · 0.69mm/px · z∈[-682,-347]mm · 12 of 77 slices shown, 14 images]
[im 5/77  soft-tissue]
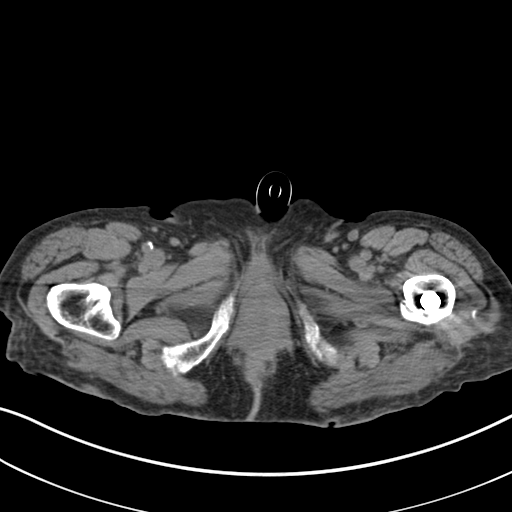
[im 5/77  bone]
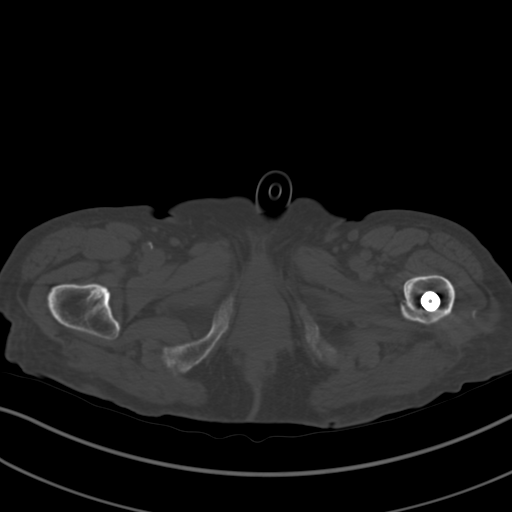
[im 13/77  soft-tissue]
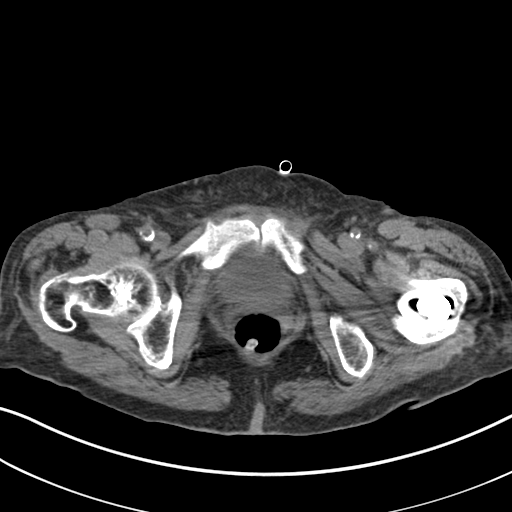
[im 17/77  soft-tissue]
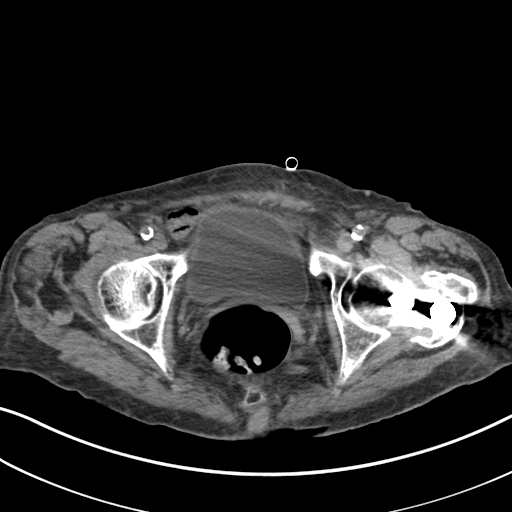
[im 22/77  soft-tissue]
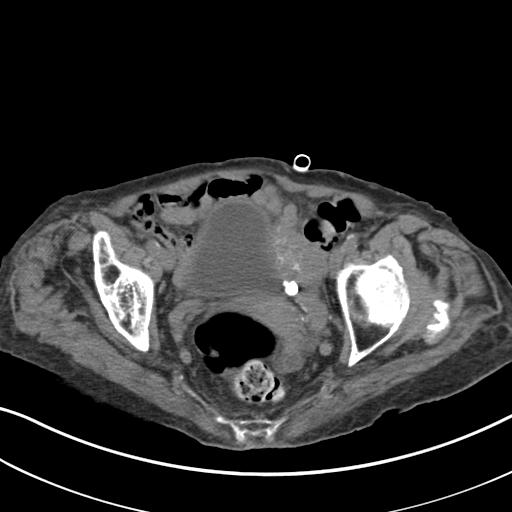
[im 30/77  soft-tissue]
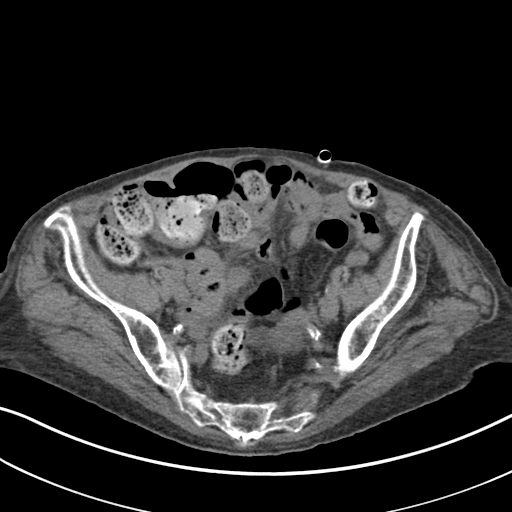
[im 34/77  soft-tissue]
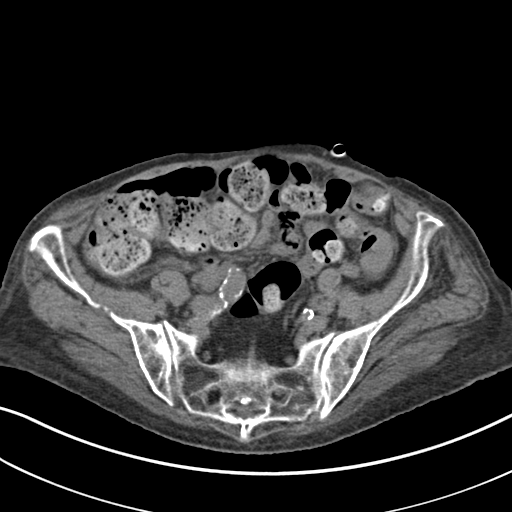
[im 43/77  soft-tissue]
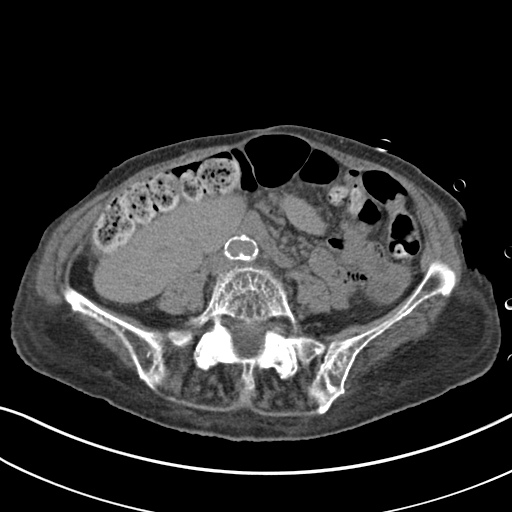
[im 47/77  soft-tissue]
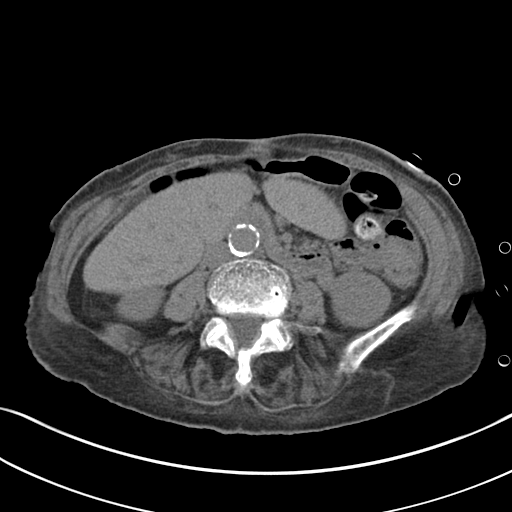
[im 55/77  soft-tissue]
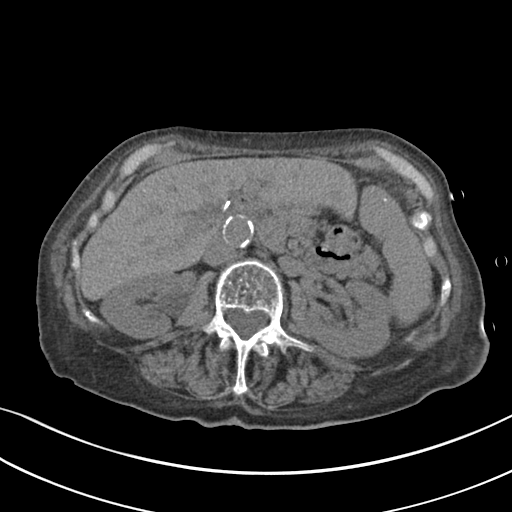
[im 55/77  bone]
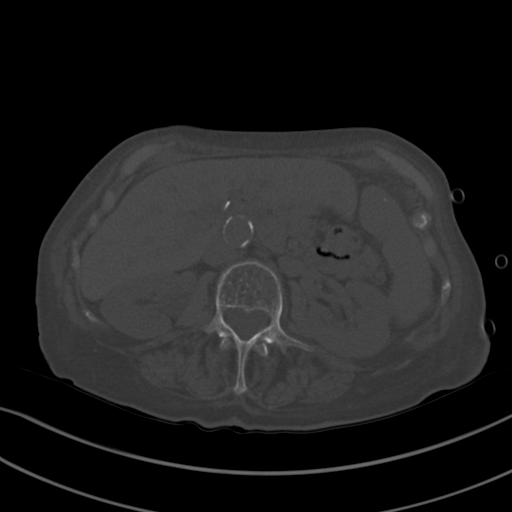
[im 60/77  soft-tissue]
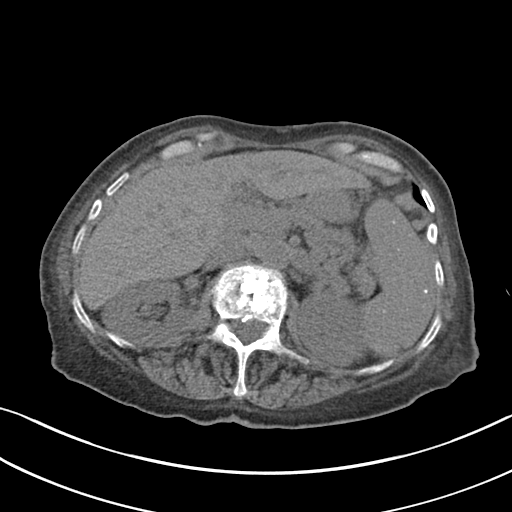
[im 64/77  soft-tissue]
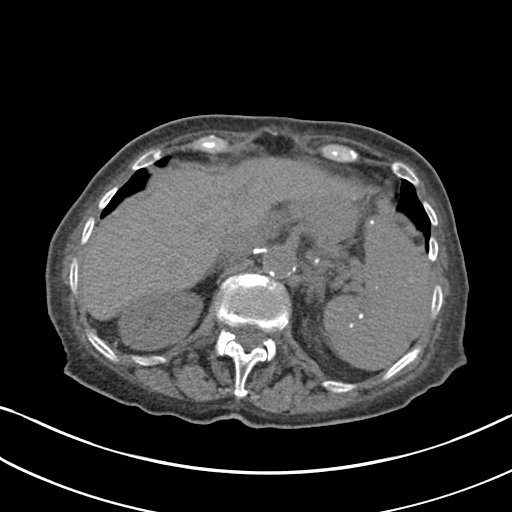
[im 72/77  soft-tissue]
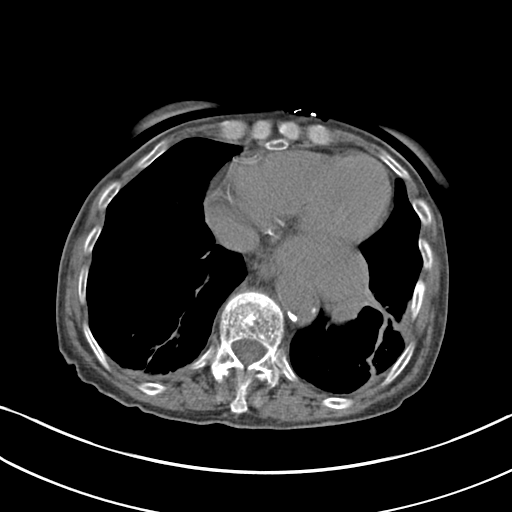

[Series 5: coronal st · coronal · 0.67mm/px · 3 of 122 slices shown]
[im 41/122  soft-tissue]
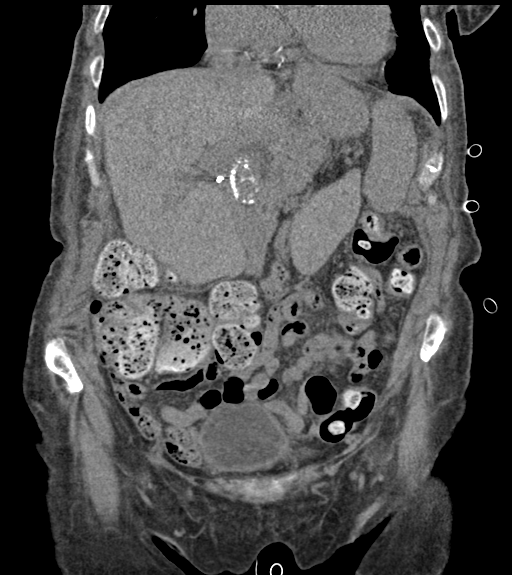
[im 54/122  soft-tissue]
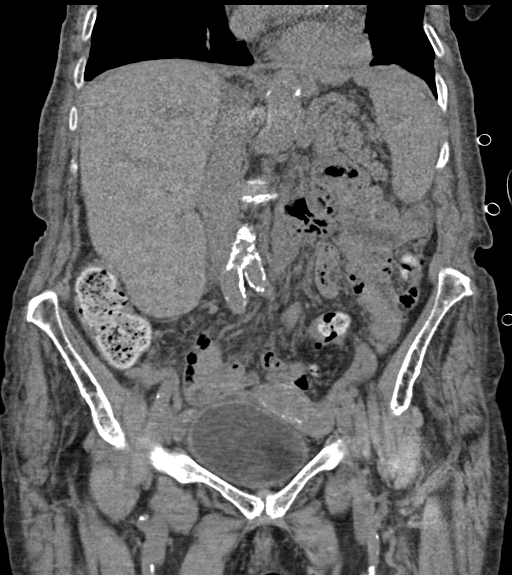
[im 68/122  soft-tissue]
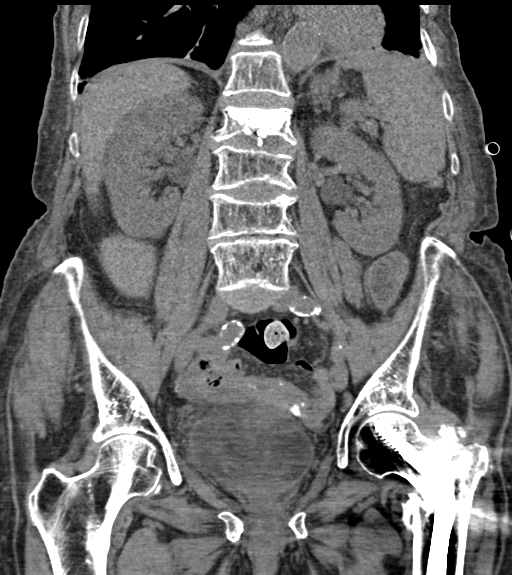

[15 of 46 positions shown; findings below may reference images not displayed]

FINDINGS: Lower chest: Bandlike areas of opacity in the lung bases likely
reflecting atelectasis and/or scarring. Consolidative opacity. No
sizable effusion. Cardiomegaly. Coronary artery calcifications.
Hiatal hernia, as below.

Hepatobiliary: Scattered hepatic parenchymal calcifications. Smooth
liver surface contour. No concerning focal liver lesion. Normal
hepatic attenuation. Prior cholecystectomy. No visible intraductal
gallstones or significant biliary ductal dilatation accounting for
post cholecystectomy reservoir effect and senescent change.

Pancreas: No pancreatic ductal dilatation or surrounding
inflammatory changes.

Spleen: Normal in size. No concerning splenic lesions. Scattered
punctate splenic granulomata. Small accessory spleen inferior to the
splenic tip.

Adrenals/Urinary Tract: Normal adrenal glands. Kidneys are symmetric
in size and normally located. Bilateral extrarenal pelves. No
urolithiasis or hydronephrosis. Visible or contour deforming renal
lesions. Urinary bladder the mildly thickened accounting for the
degree of distention.

Stomach/Bowel: Large hiatal hernia, as above. Distal stomach and
duodenum are unremarkable. No small bowel thickening or dilatation.
The appendix is surgically absent. Moderate colonic stool burden.
Scattered distal colonic predominant diverticula without noted as of
acute diverticulitis at this time. No colonic dilatation or wall
thickening.

Vascular/Lymphatic: Atherosclerotic calcification throughout the
abdominal aorta and branch vessels. No discernible abdominopelvic
adenopathy.

Reproductive: Fibroid uterus including a coarsely calcified left
lower uterine segment fibroid. Extensive myometrial calcifications
are unchanged from comparison. No worrisome adnexal masses or
lesions.

Other: Mild circumferential body wall edema. More focal postsurgical
stranding superficial to the left hip. No free abdominopelvic fluid
or air. Some anterior abdominal wall laxity is unchanged.

Musculoskeletal: The osseous structures appear diffusely
demineralized which may limit detection of small or nondisplaced
fractures. By in the spine are better detailed on dedicated lumbar
reconstructions. Degenerative changes in hips pelvis. Prior left
femoral intramedullary nail placement with transcervical fixation
an. Apposes traumatic deformity the from prior intertrochanteric
left femur fracture. Resolution of the previously seen left
piriformis intramuscular hemorrhage. No acute or concerning muscular
abnormality is seen.
IMPRESSION: Colonic diverticulosis without evidence of acute diverticulitis.

Moderate colonic stool burden, correlate for features of slow
transit/constipation.

Moderate to large hiatal hernia.

Evidence of prior granulomatous disease in the in the abdomen.

Aortic Atherosclerosis (JPT83-SVF.F). Coronary artery
atherosclerosis.

Prior left femoral intramedullary nail placement and transcervical
fixation pin. Remote posttraumatic deformity left inter trochanteric
femur fracture.

Findings in the spine are better detailed on dedicated CT
reconstructions generated and dictated separately.

## 2023-04-19 IMAGING — CT CT L SPINE W/O CM
3 of 6 series · 11 of 33 positions shown, 13 images · non-contrast
Comparison: Contemporary CT abdomen and pelvis., CT L-spine
07/04/2020

CLINICAL DATA: Bilateral lower back history of prior compression
fractures and augmentation

EXAM:
CT LUMBAR SPINE WITHOUT CONTRAST
TECHNIQUE: Multidetector CT imaging of the lumbar spine was performed without
intravenous contrast administration. Multiplanar CT image
reconstructions were also generated.

[Series 10: coronal l spine · coronal · 0.35mm/px · 1 of 79 slices shown]
[im 40/79  bone]
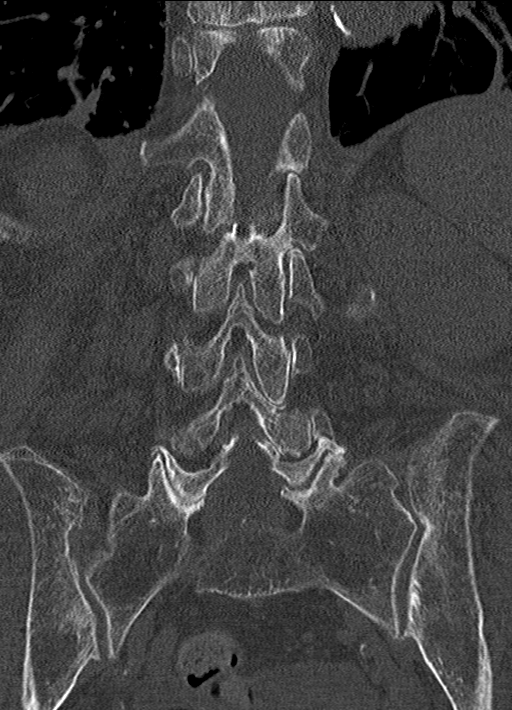

[Series 12: sagittal l spine · sagittal · 0.29mm/px · 5 of 80 slices shown]
[im 14/80  bone]
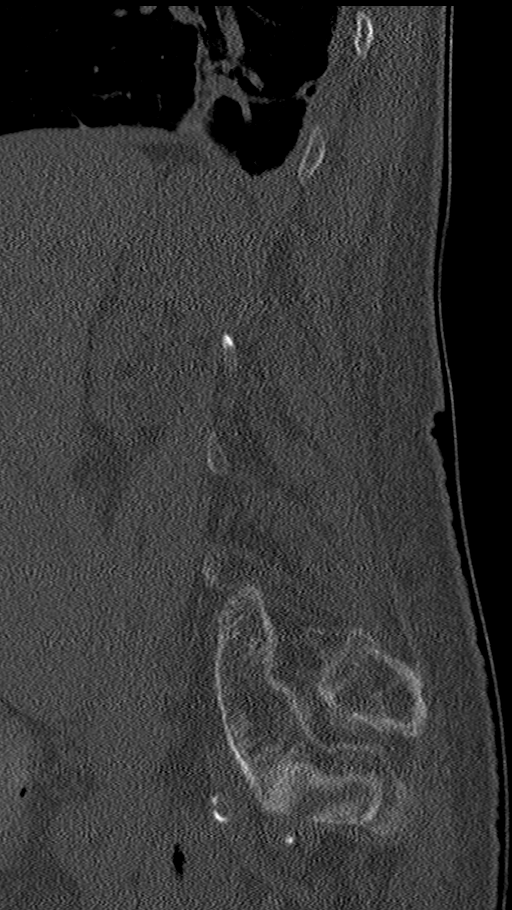
[im 27/80  bone]
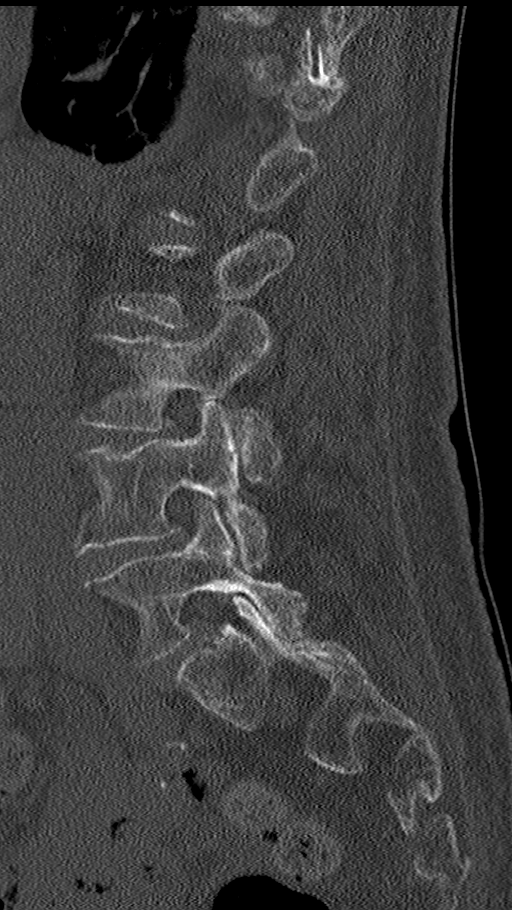
[im 40/80  bone]
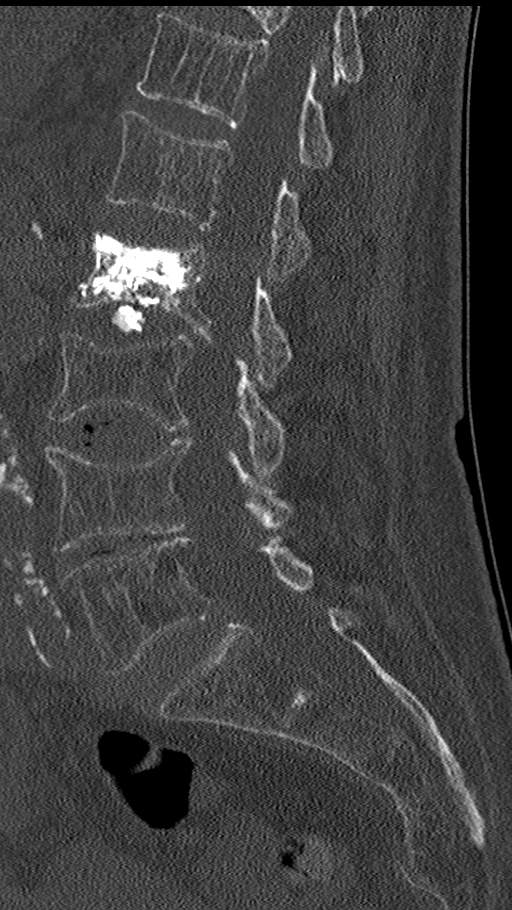
[im 53/80  bone]
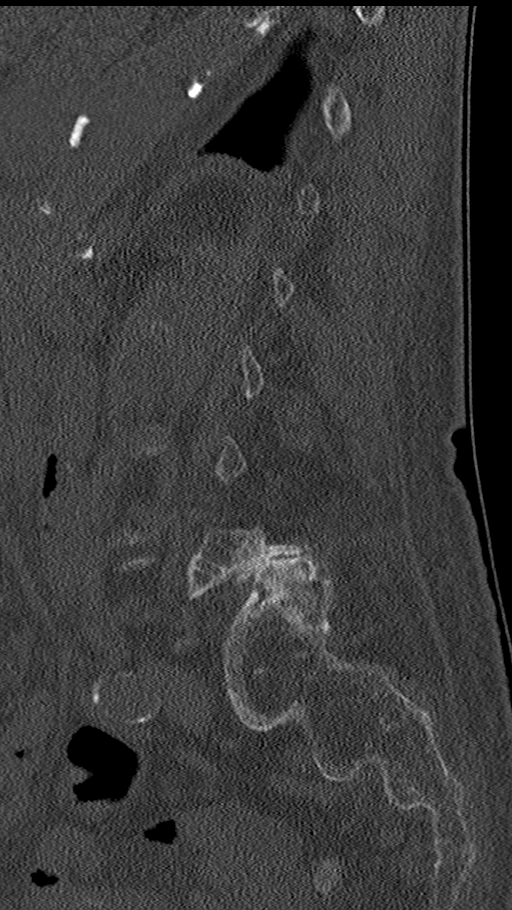
[im 66/80  bone]
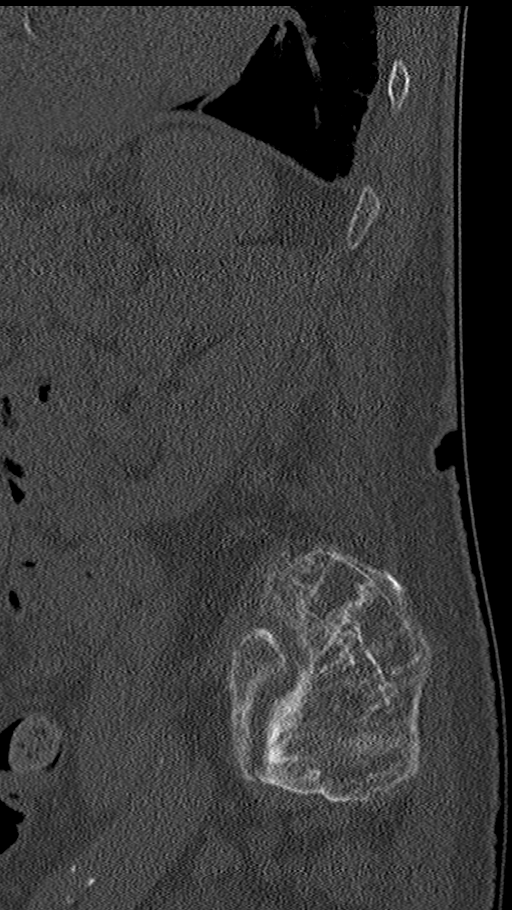

[Series 13: axial st lspine · axial · 0.30mm/px · z∈[-506,-360]mm · 5 of 111 slices shown, 7 images]
[im 19/111  soft-tissue]
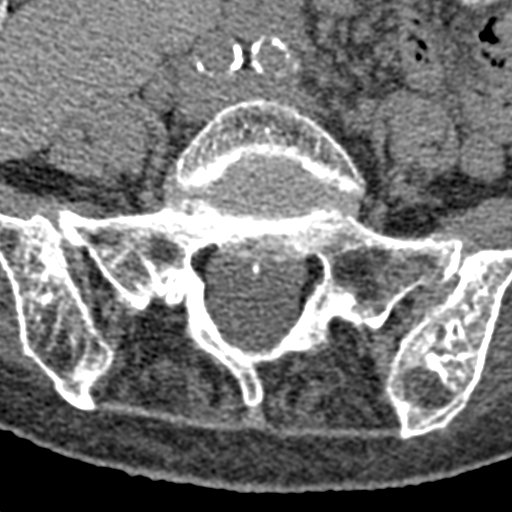
[im 19/111  bone]
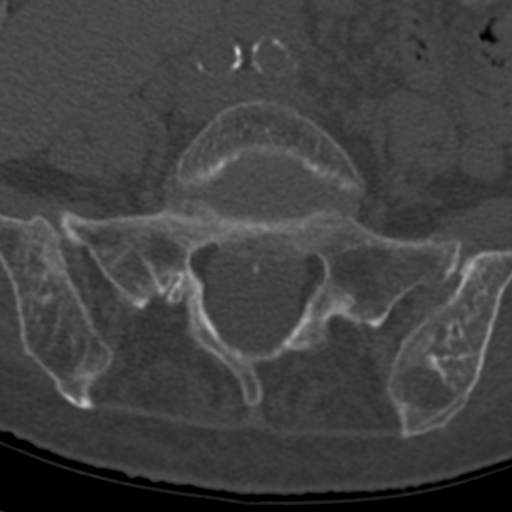
[im 37/111  bone]
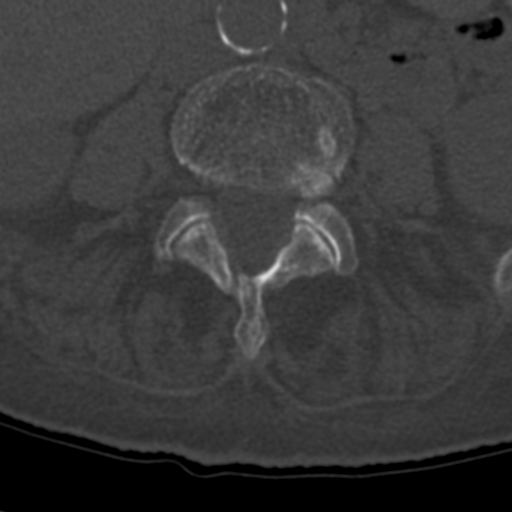
[im 56/111  bone]
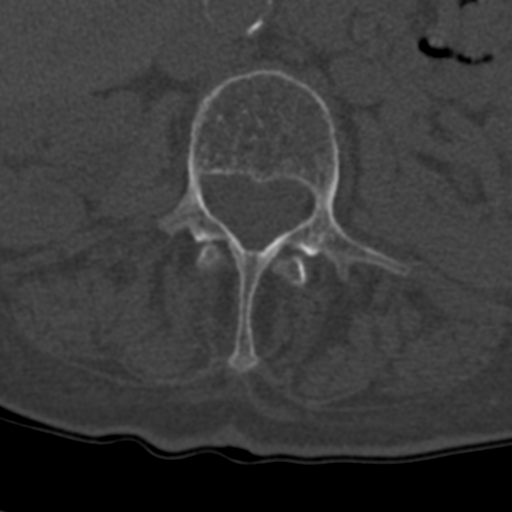
[im 74/111  bone]
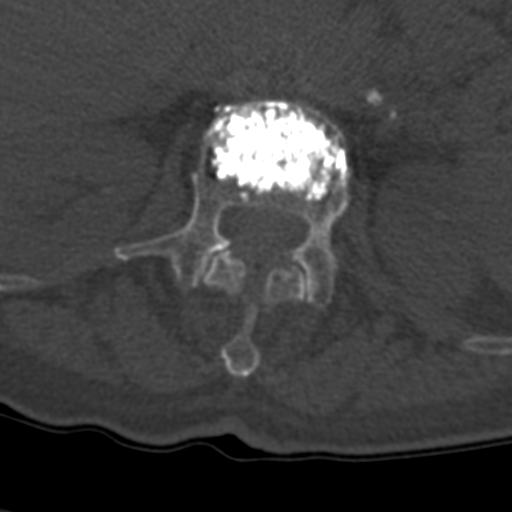
[im 92/111  soft-tissue]
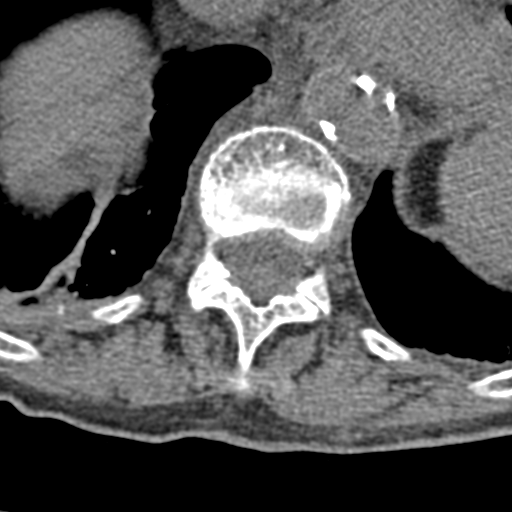
[im 92/111  bone]
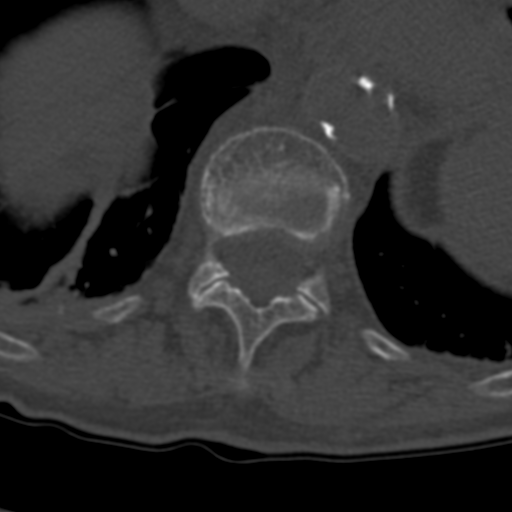

[11 of 33 positions shown; findings below may reference images not displayed]

FINDINGS: Segmentation: 5 normally formed lumbar type vertebral levels.
Multiple prior reports with completing numbering schema. In keeping
with most recent numbering convention from CT imaging 07/04/2020, a
vertebral augmentation is seen at L2 with the lowest fully formed
disc space denoted as L5-S1.

Alignment: Very mild levocurvature at the thoracolumbar junction.
Mild grade 1 anterolisthesis L5 on S1 is unchanged. No new
significant spondylolisthesis or spondylolysis.

Vertebrae: Prior vertebral body augmentation at L2 without new
vertebral body height loss. Some chronic L3, L4 height loss is
unchanged from comparison. Question some degrees T11 inferior
endplate though this is a large the characterized on this
examination. The osseous structures appear diffusely demineralized
which may limit detection of small or nondisplaced fractures.

Paraspinal and other soft tissues: No paraspinal fluid, swelling,
gas or hemorrhage. No visible canal abnormality is seen within
limitations of this unenhanced CT exam. For findings in the
posterior abdomen pelvis, see dedicated contemporary CT of the
abdomen pelvis from which this study is reconstructed.

Disc levels:  Level by level evaluation of the lumbar spine below:

T12-L1: Disc height loss, no significant posterior disc abnormality.
No significant spinal canal or foraminal stenosis.

L1-L2: Disc height loss. L2 vertebral augmentation. Bilateral facet
arthropathy. Global disc bulge. Minimal canal stenosis and bilateral
foraminal narrowing.

L2-L3: L2 compression deformity post augmentation with retropulsion
of the superior endplate by approximately 6 mm. Disc height loss.
Mild global disc bulge. Bilateral facet arthropathy. Mild to
moderate canal stenosis. Moderate bilateral foraminal narrowing.
Disc height loss vacuum disc and global disc bulge facet arthropathy
the mild canal stenosis and moderate bilateral foraminal narrowing.

L3-L4: Disc height loss, global disc bulge and vacuum disc
phenomenon moderate facet arthropathy. Mild canal stenosis bilateral
foraminal narrowing.

L4-L5: Disc height loss, shallow global disc bulge vacuum disc.
Facet arthropathy. No significant canal stenosis. Mild bilateral
foraminal narrowing.

L5-S1: Grade 1 anterolisthesis. Global disc bulge. Bilateral facet
arthropathy. No significant resulting canal stenosis. Mild bilateral
foraminal narrowing.
IMPRESSION: 1. Questionable deformity of the inferior endplate T11 though this
is on the margins of imaging and incompletely visualized on this
exam. Correlate for focal tenderness and there is clinical concern
completion imaging could be obtained.
2. Unchanged appearance of a remote deformity and augmentation at
L2. Chronic height loss L3 and L4 is also stable.
3. Multilevel discogenic and facet degenerative changes, maximal at
the L2-L3 level exacerbated by inferior endplate retropulsion of L2.
Described level by level above.
4. The osseous structures appear diffusely demineralized which may
limit detection of small or nondisplaced fractures.
5. Dedicated CT of the abdomen and pelvis was performed and dictated
separately.

## 2023-04-22 DIAGNOSIS — S22000A Wedge compression fracture of unspecified thoracic vertebra, initial encounter for closed fracture: Secondary | ICD-10-CM | POA: Diagnosis not present

## 2023-04-22 DIAGNOSIS — I10 Essential (primary) hypertension: Secondary | ICD-10-CM | POA: Diagnosis not present

## 2023-04-22 DIAGNOSIS — I2510 Atherosclerotic heart disease of native coronary artery without angina pectoris: Secondary | ICD-10-CM | POA: Diagnosis not present

## 2023-04-22 DIAGNOSIS — K219 Gastro-esophageal reflux disease without esophagitis: Secondary | ICD-10-CM | POA: Diagnosis not present

## 2023-04-22 DIAGNOSIS — M810 Age-related osteoporosis without current pathological fracture: Secondary | ICD-10-CM | POA: Diagnosis not present

## 2023-04-22 DIAGNOSIS — I639 Cerebral infarction, unspecified: Secondary | ICD-10-CM | POA: Diagnosis not present

## 2023-04-22 DIAGNOSIS — I509 Heart failure, unspecified: Secondary | ICD-10-CM | POA: Diagnosis not present

## 2023-05-12 IMAGING — DX DG LUMBAR SPINE COMPLETE 4+V
5 series · 5 of 5 positions shown · non-contrast
Comparison: September 04, 2019 lumbar radiograph;

CLINICAL DATA: Chronic low back pain

EXAM:
LUMBAR SPINE - COMPLETE 4+ VIEW

[l-spine ap]
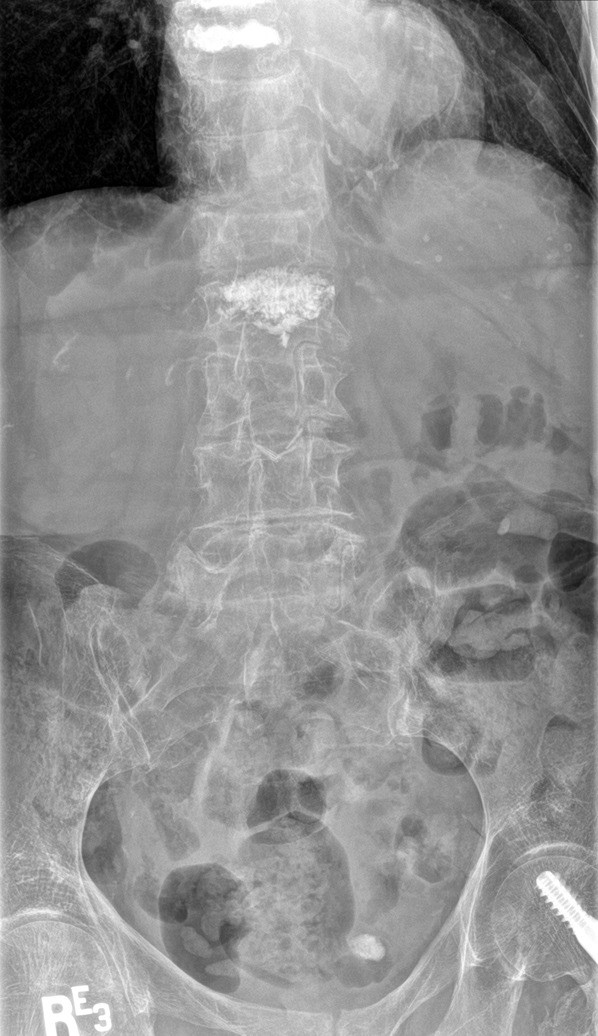

[l-spine obl (1 of 2)]
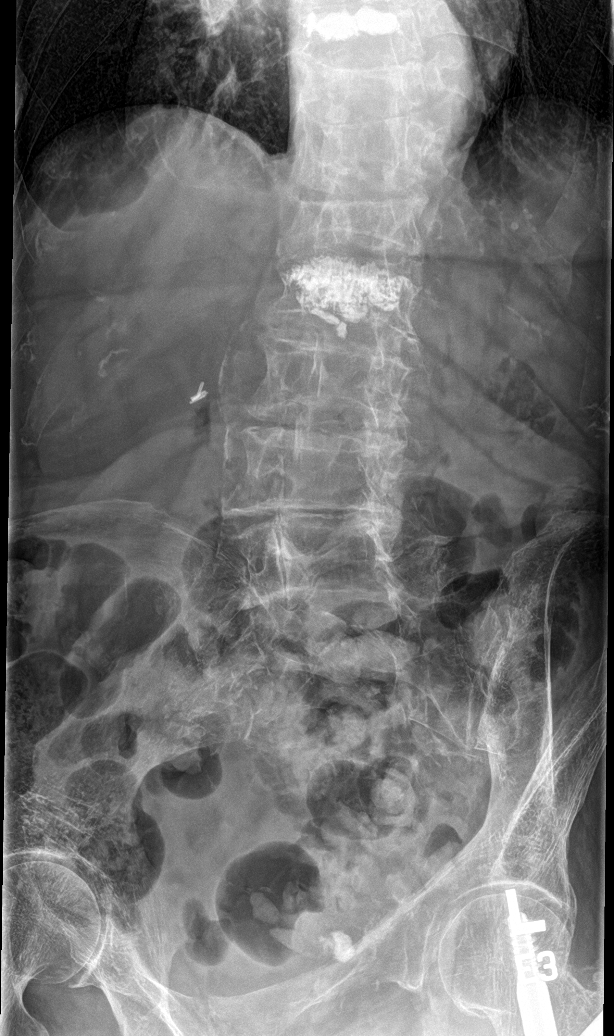

[l-spine obl (2 of 2)]
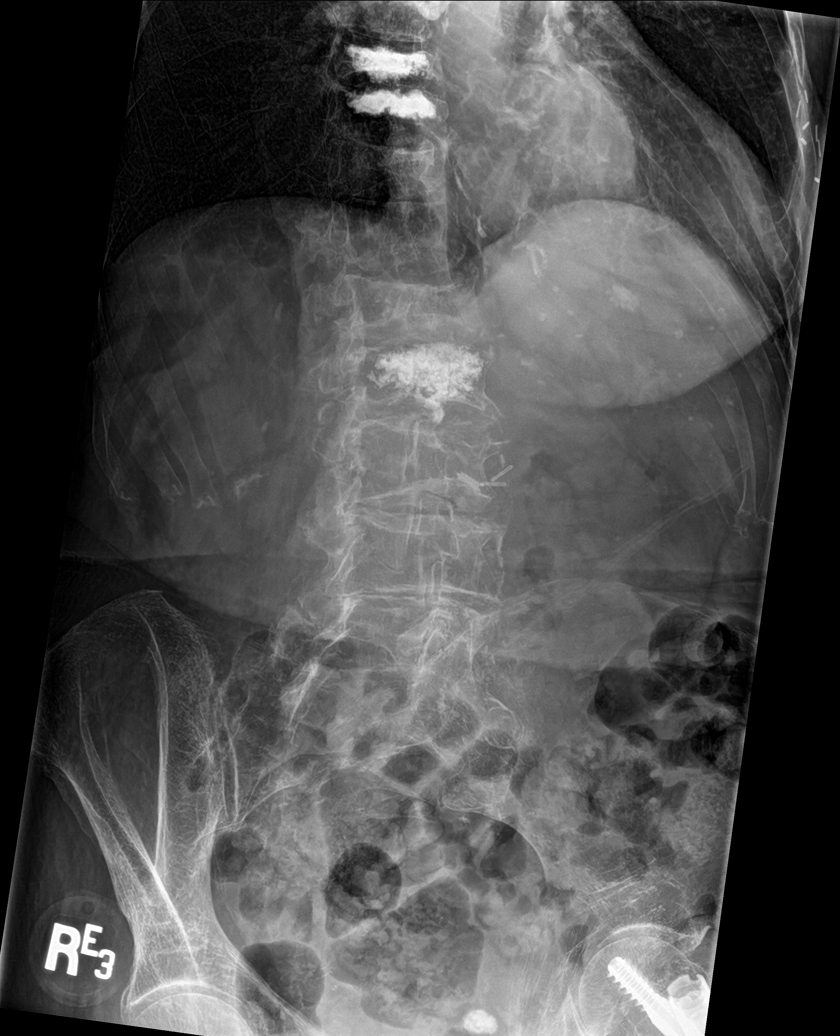

[l-spine lat]
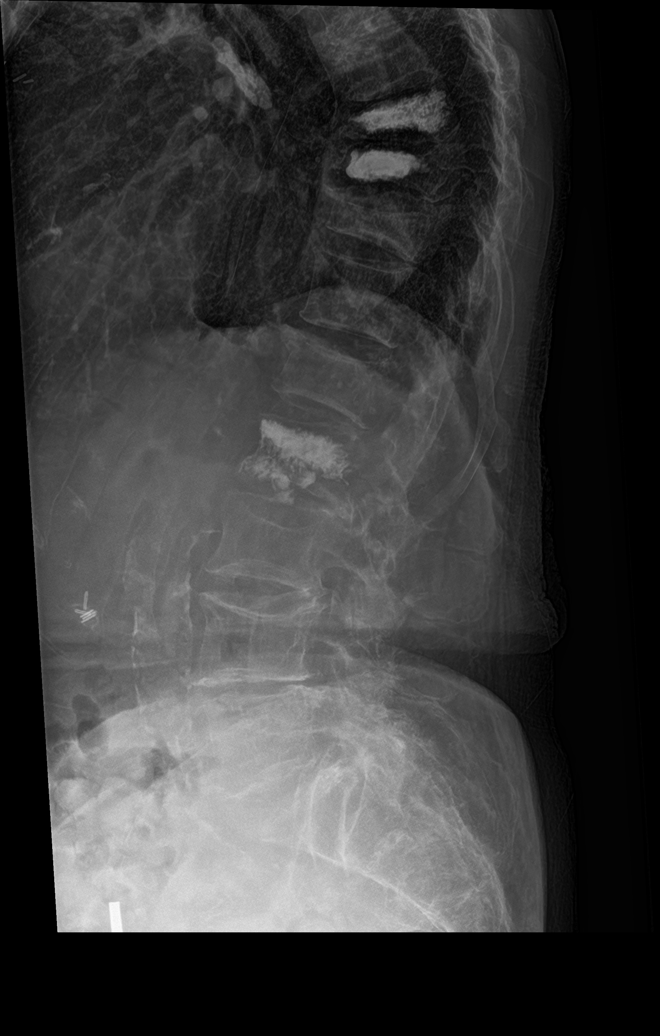

[l-spine spot]
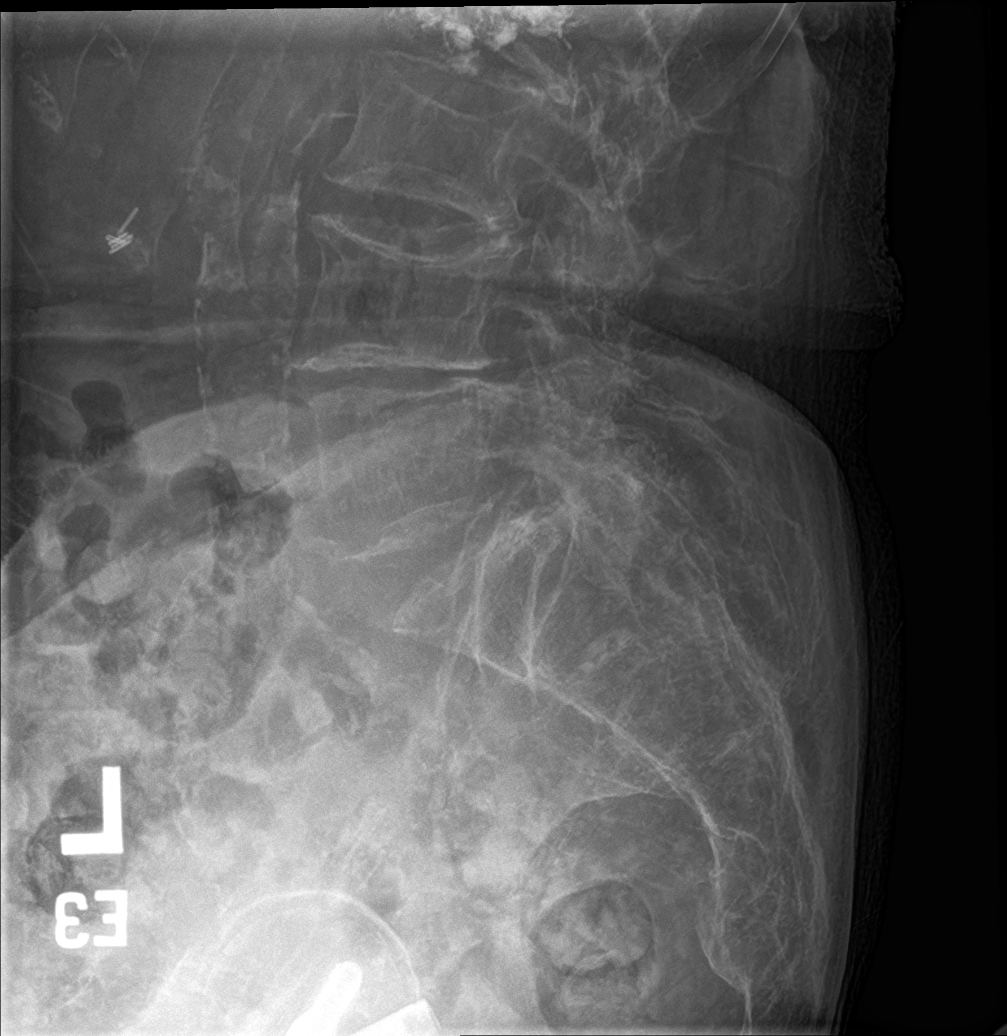

[5 of 5 positions shown; findings below may reference images not displayed]

lumbar CT October 03, 2020. Thoracic and lumbar spine CT examinations July 04, 2020
FINDINGS: Frontal, lateral, spot lumbosacral lateral, and bilateral oblique
views were obtained. Bones osteoporotic. There are 5 non-rib-bearing
lumbar type vertebral bodies. Patient is status post kyphoplasty
procedure at L2. Patient is status post kyphoplasty procedures for
fractures at T9 and T10. There is moderately severe wedging of the
T11 vertebral body. There is also anterior wedging at L1, not
present on most recent study. There are areas of endplate concavity
at L3 and L4, stable. No appreciable spondylolisthesis. There is
disc space narrowing at L4-5. Other disc spaces appear unremarkable.
There is facet osteoarthritic change at L4-5 and L5-S1 bilaterally.
There is aortic atherosclerosis.
IMPRESSION: Osteoporosis. Previous kyphoplasty procedures at T9, T10, and L2.
There is moderately severe anterior wedging at T11 with milder
anterior wedging at L1, not present on prior studies. No appreciable
spondylolisthesis. Moderate disc space narrowing noted at L4-5.
Facet osteoarthritic change at L4-5 and L5-S1 bilaterally.

Aortic Atherosclerosis (T5I88-PSW.W).

## 2023-05-12 IMAGING — DX DG HIP (WITH OR WITHOUT PELVIS) 2-3V*R*
3 series · 3 of 3 positions shown · non-contrast
Comparison: October 05, 2020

CLINICAL DATA: Pain

EXAM:
DG HIP (WITH OR WITHOUT PELVIS) 2-3V RIGHT

[pelvis ap]
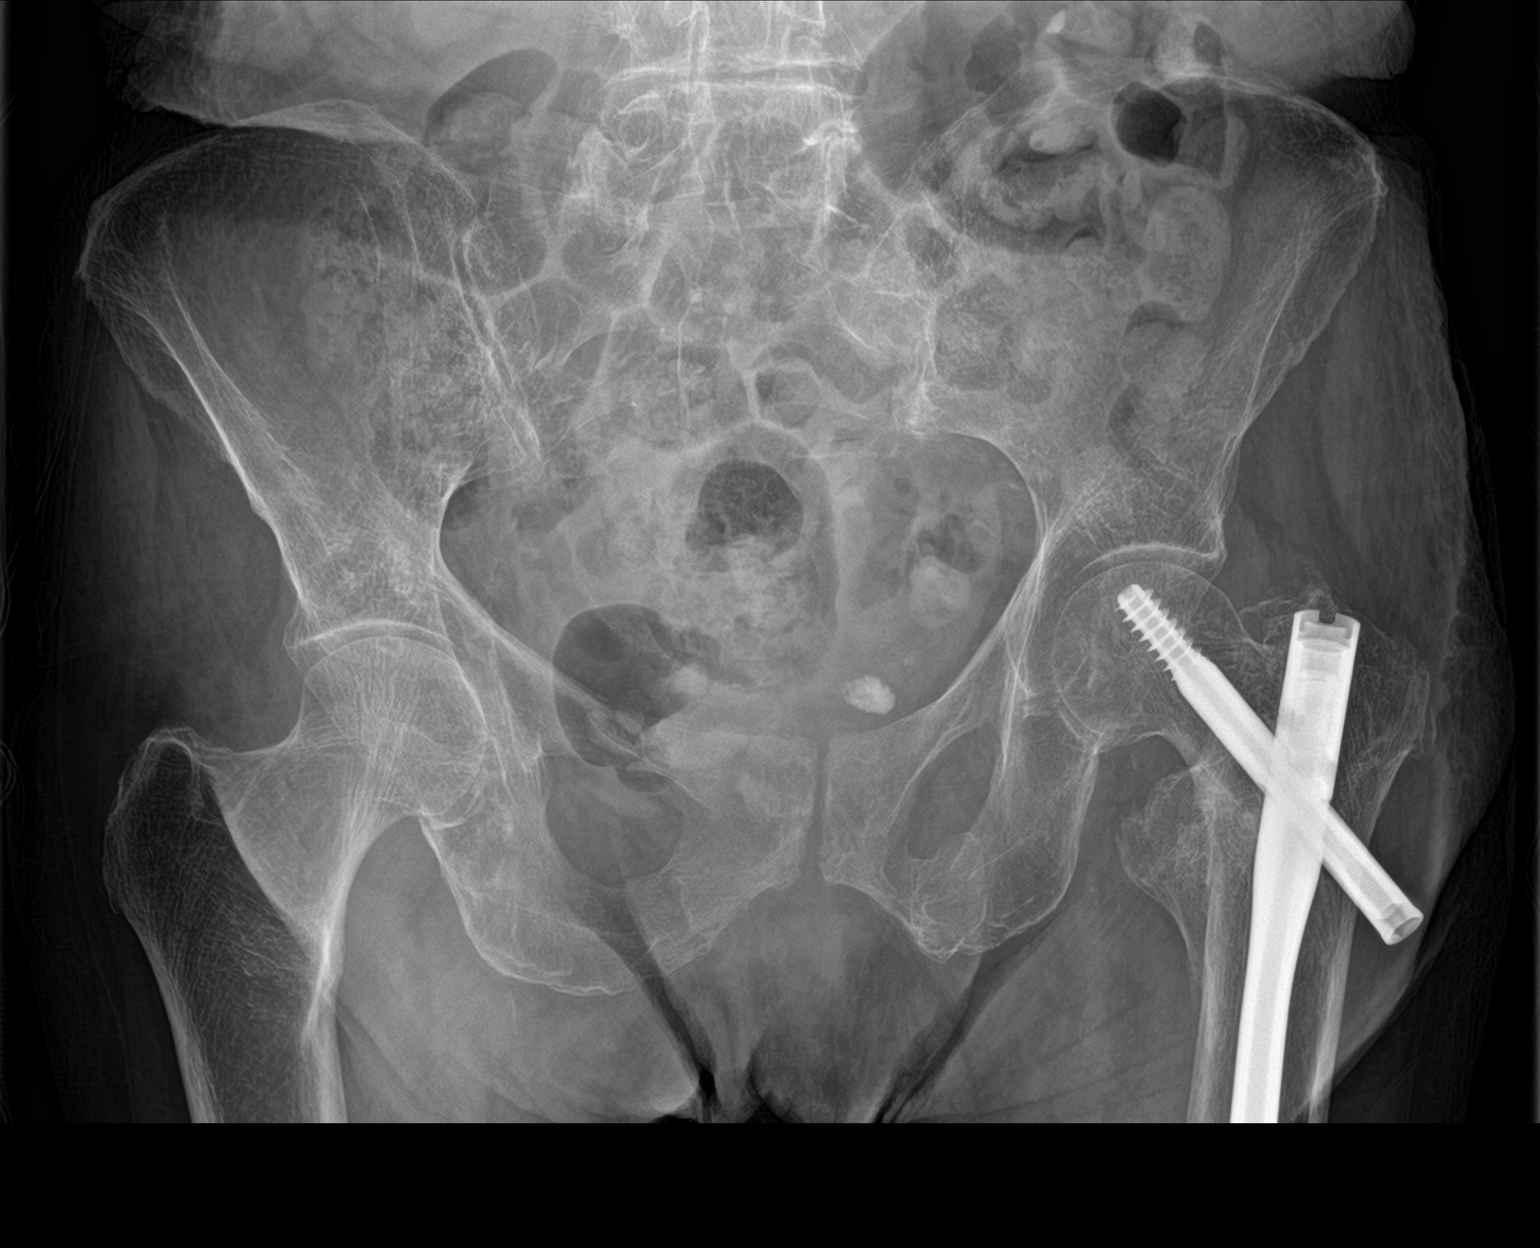

[hip ap]
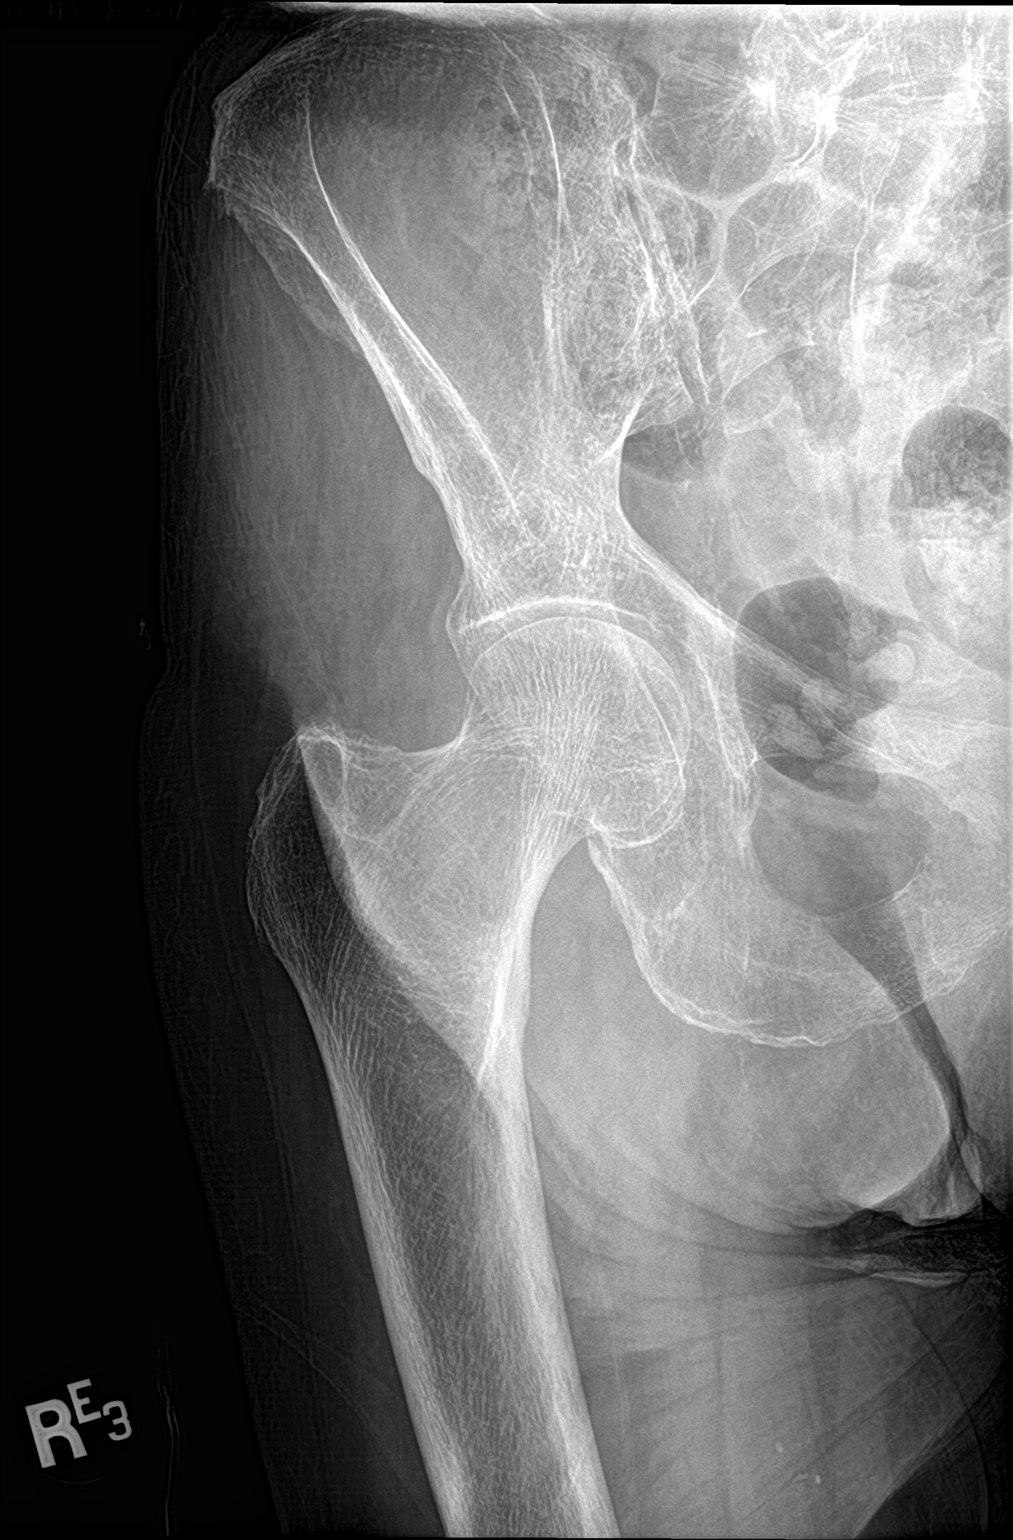

[hip frog leg]
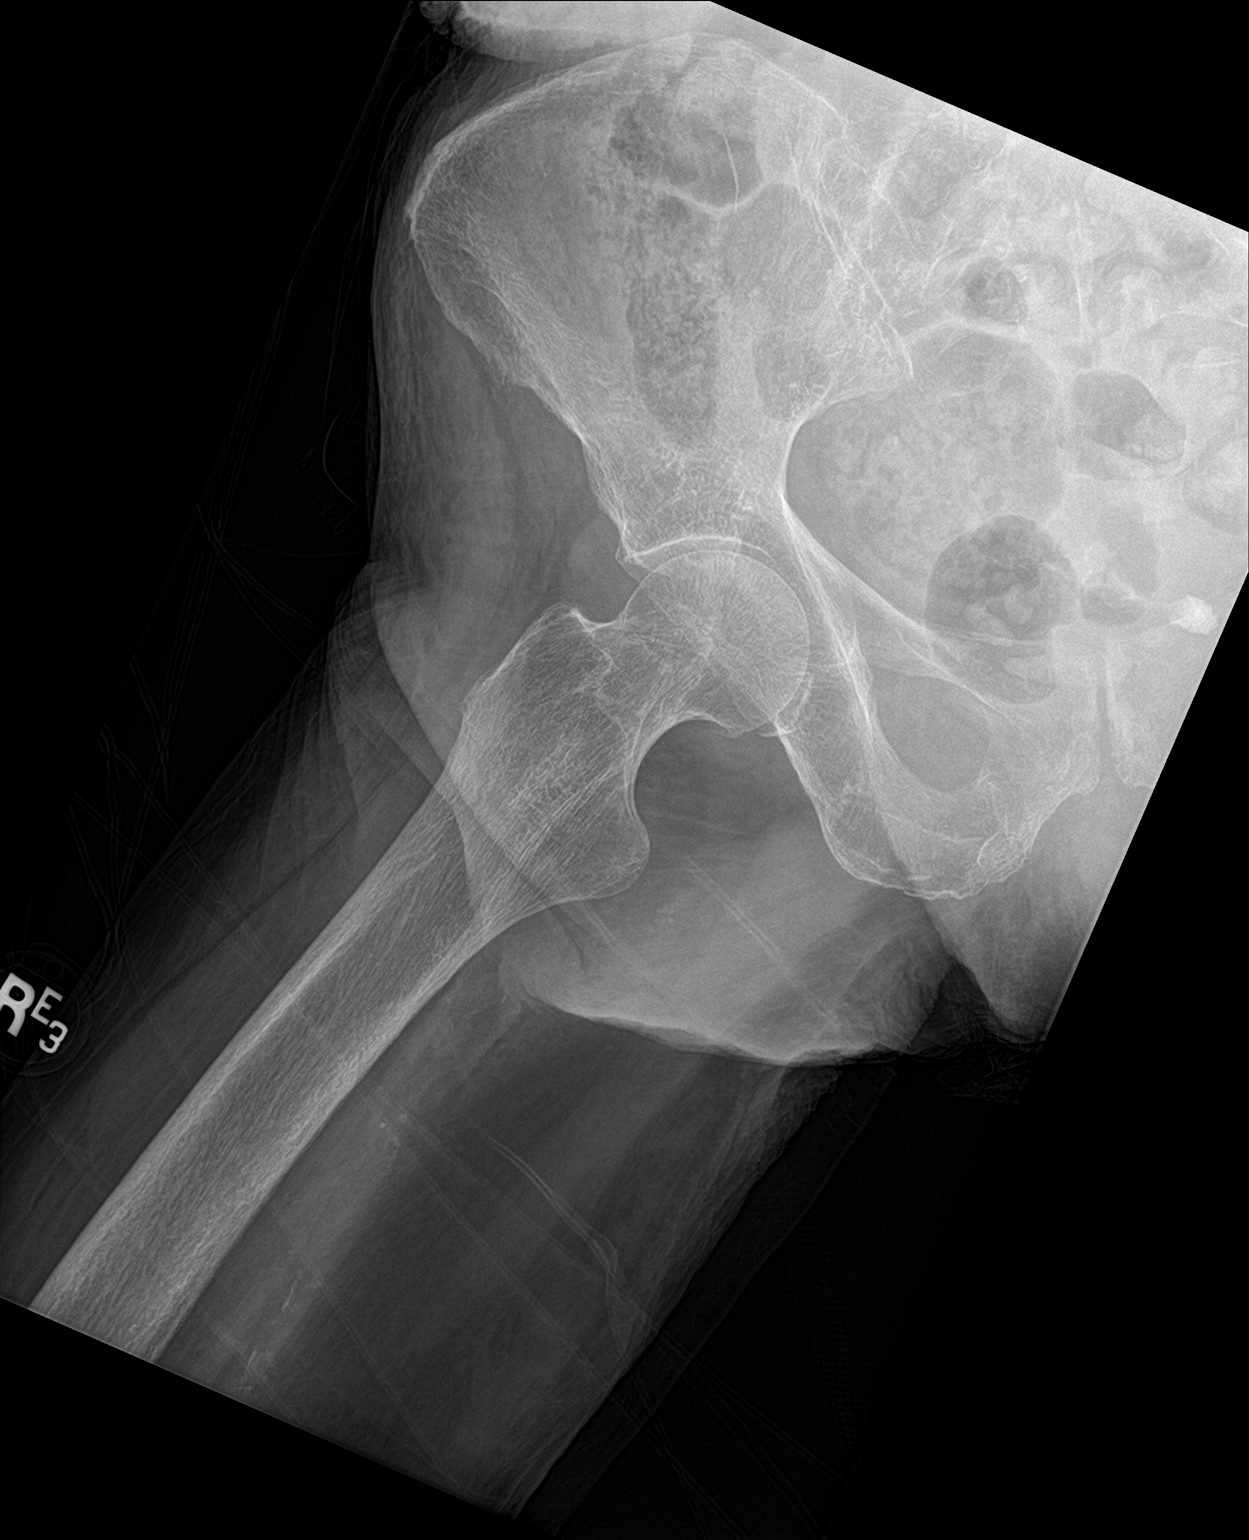

[3 of 3 positions shown; findings below may reference images not displayed]

FINDINGS: Frontal pelvis as well as frontal and lateral right hip images were
obtained. Bones are diffusely osteoporotic. Postoperative change
noted in the visualized proximal left femur. Old healed fracture
left ischium. No acute fracture or dislocation. There is moderate
symmetric narrowing of each hip joint, stable. There is degenerative
change in the left sacroiliac joint, stable. No erosive changes.
IMPRESSION: Osteoporosis. Postoperative screw and nail fixation in the proximal
left femur, stable, with evidence of prior fracture of the left
ischium with remodeling. No acute fracture or dislocation. Moderate
symmetric narrowing each hip joint. Moderate osteoarthritic change
sacroiliac joint.

## 2023-06-02 IMAGING — RF DG THORACOLUMBAR SPINE 2V
1 series · 3 of 3 positions shown · non-contrast
Comparison: Lumbar spine radiographs 10/26/2020.

CLINICAL DATA: Elective surgery. Additional history provided: T11,
L1 kyphoplasty. Provided fluoroscopy time 296.4 seconds (64.6 mGy).

EXAM:
THORACOLUMBAR SPINE 1V

[Series 1: run · 3 of 3 slices shown]
[im 1/3]
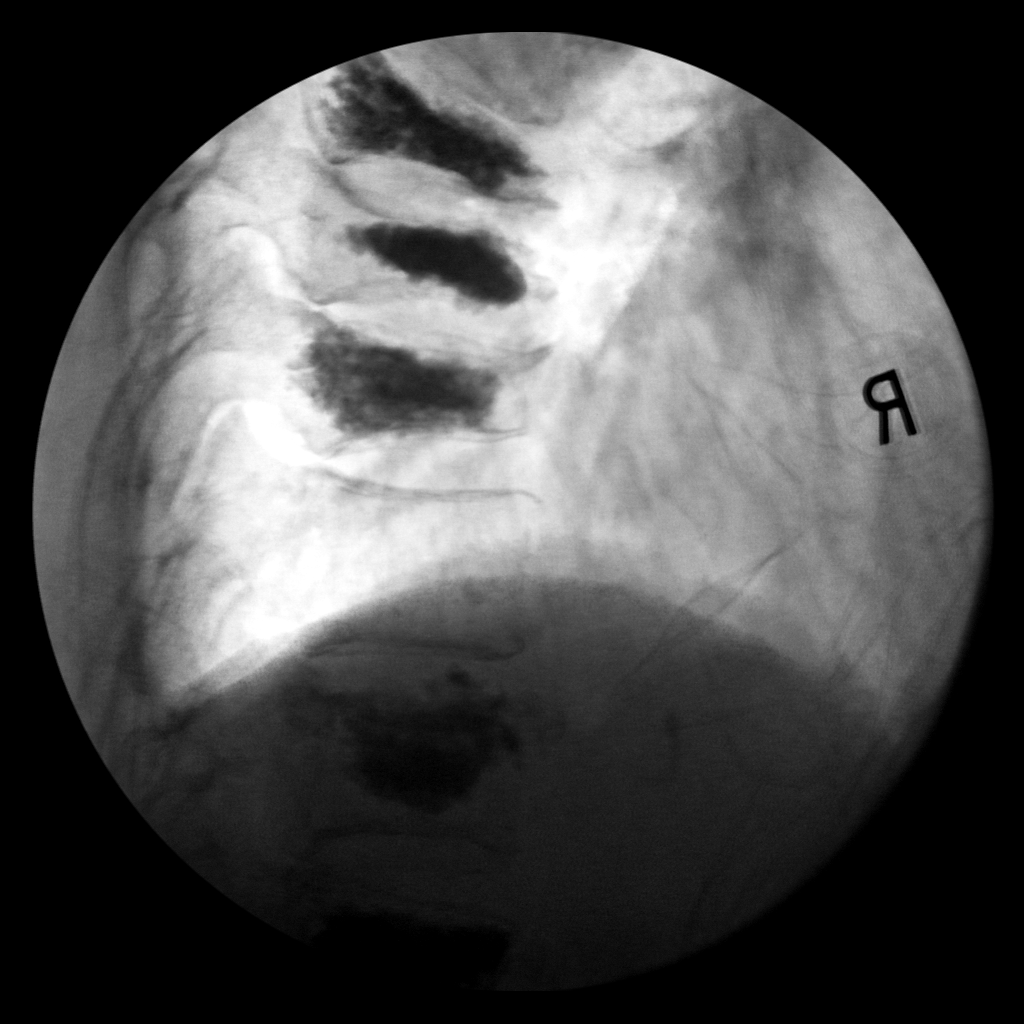
[im 2/3]
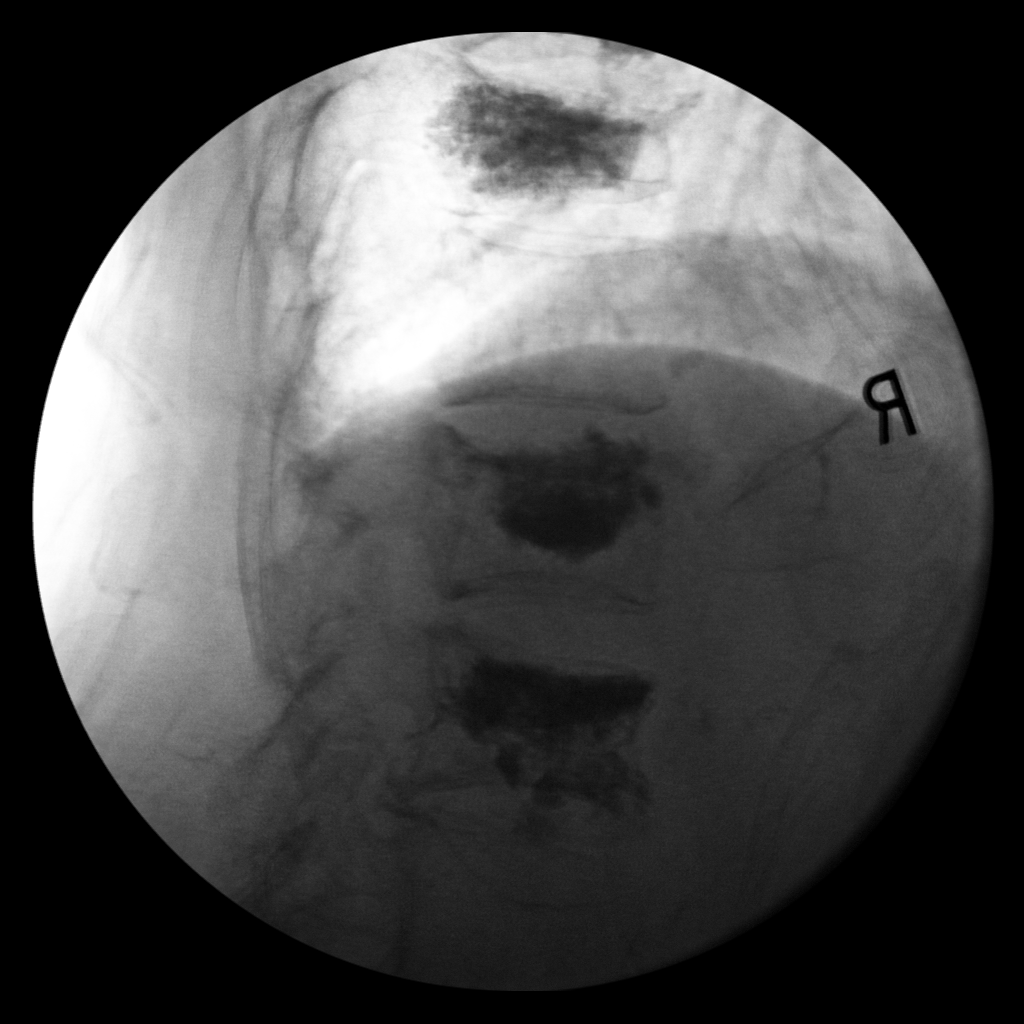
[im 3/3]
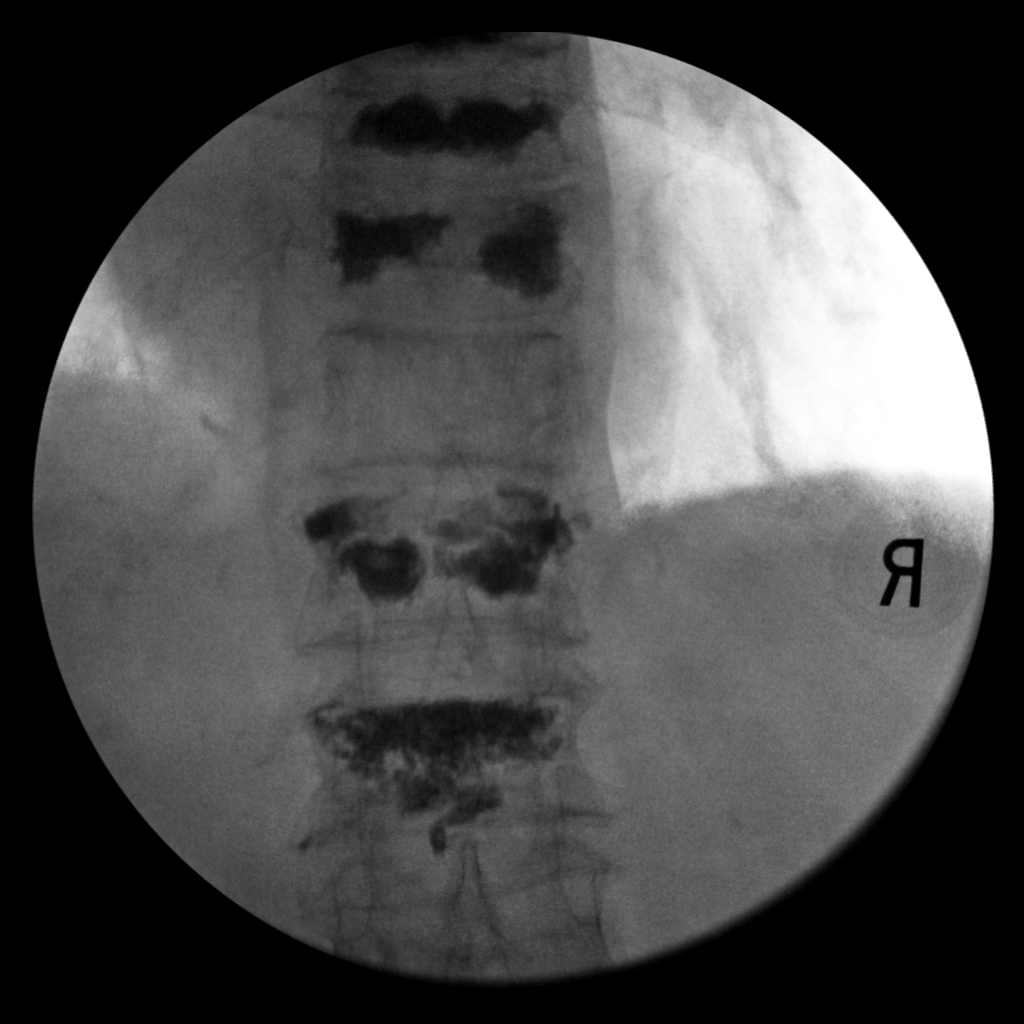

[3 of 3 positions shown; findings below may reference images not displayed]

FINDINGS: AP and lateral view intraprocedural fluoroscopic images of the
thoracolumbar spine are submitted, 3 images total. On the provided
images, kyphoplasty material is present within the T9, T10, T11, L1
and L2 vertebrae at sites of known compression fractures.
Kyphoplasty at the T11 and L1 levels is new as compared to the prior
lumbar spine radiographs of 10/26/2020.
IMPRESSION: Three intraprocedural fluoroscopic images of the thoracolumbar spine
from T11 and L1 kyphoplasty, as described.

## 2023-06-02 IMAGING — RF DG C-ARM 1-60 MIN
1 series · 3 of 3 positions shown · non-contrast
Comparison: Lumbar spine radiographs 10/26/2020.

CLINICAL DATA: Elective surgery. Additional history provided: T11,
L1 kyphoplasty. Provided fluoroscopy time 296.4 seconds (64.6 mGy).

EXAM:
THORACOLUMBAR SPINE 1V

[Series 1: run · 3 of 3 slices shown]
[im 1/3]
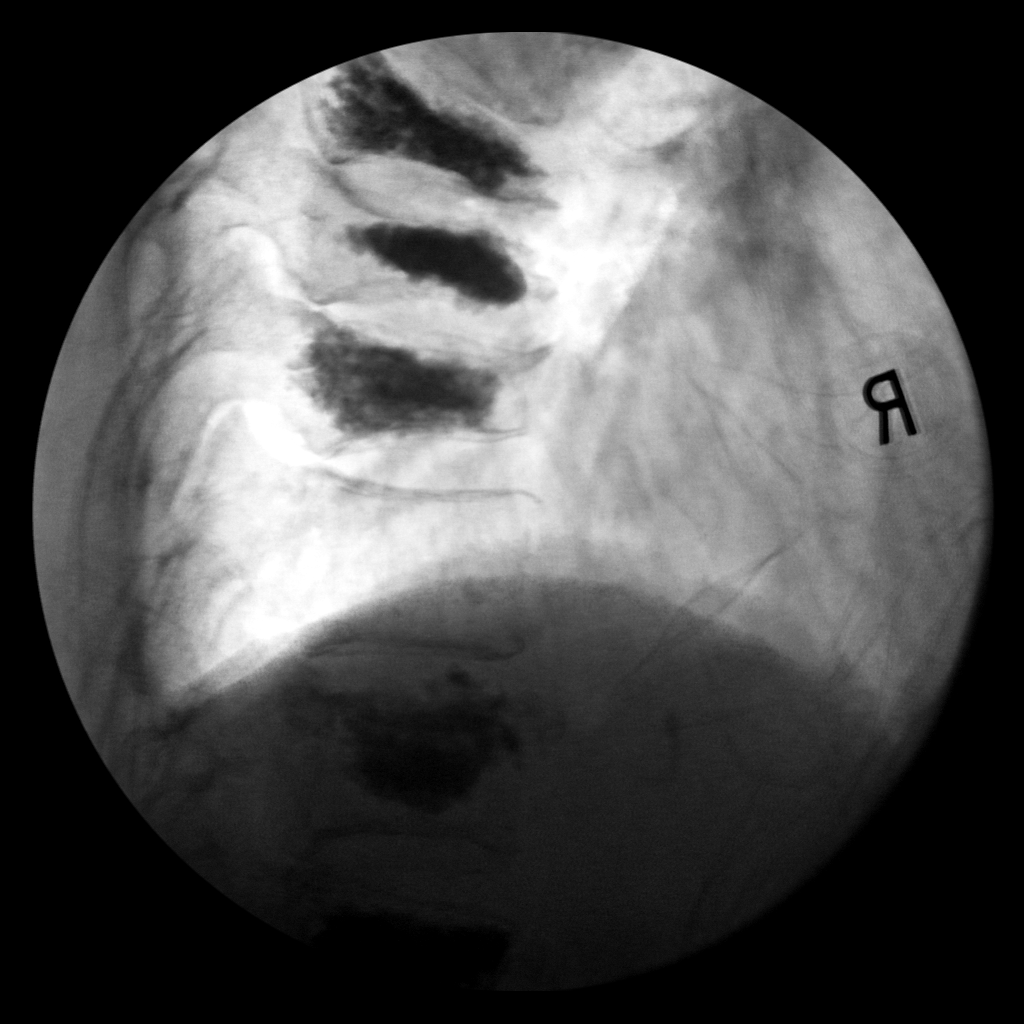
[im 2/3]
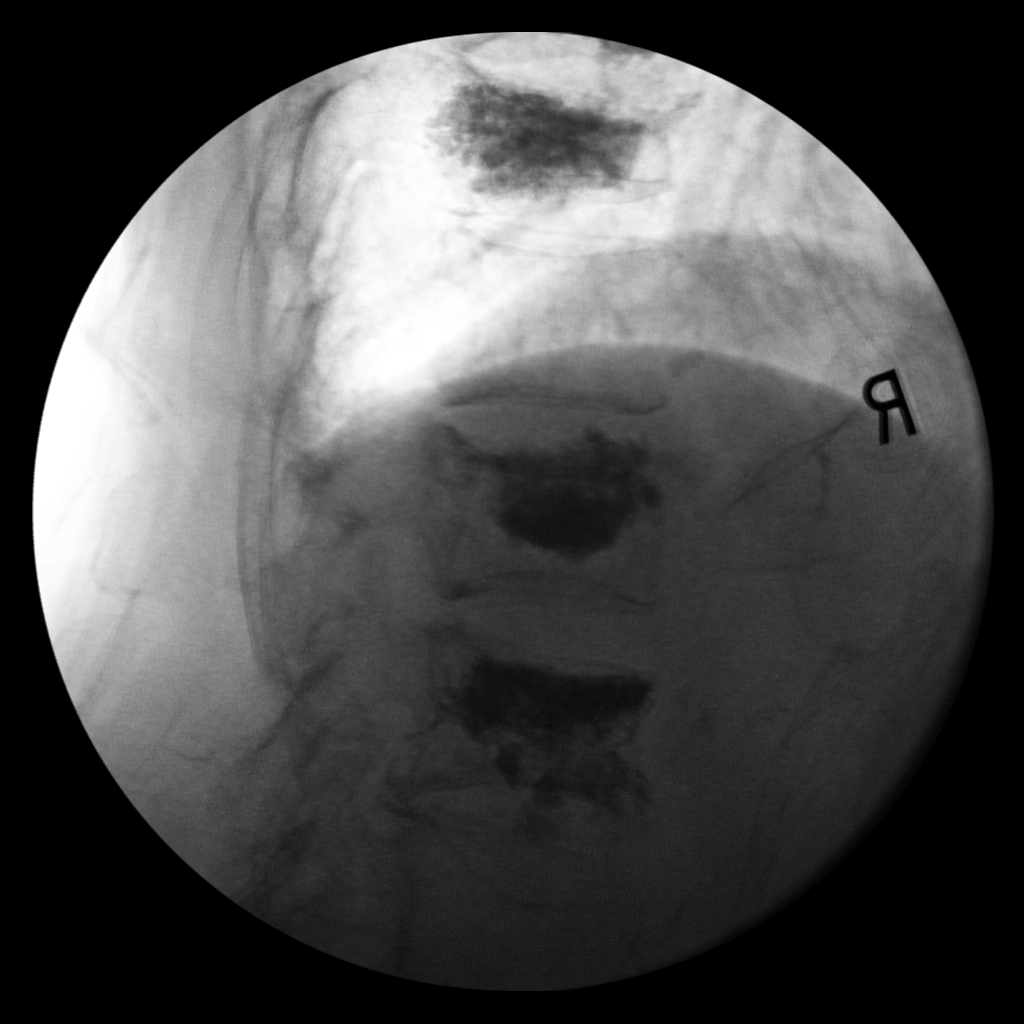
[im 3/3]
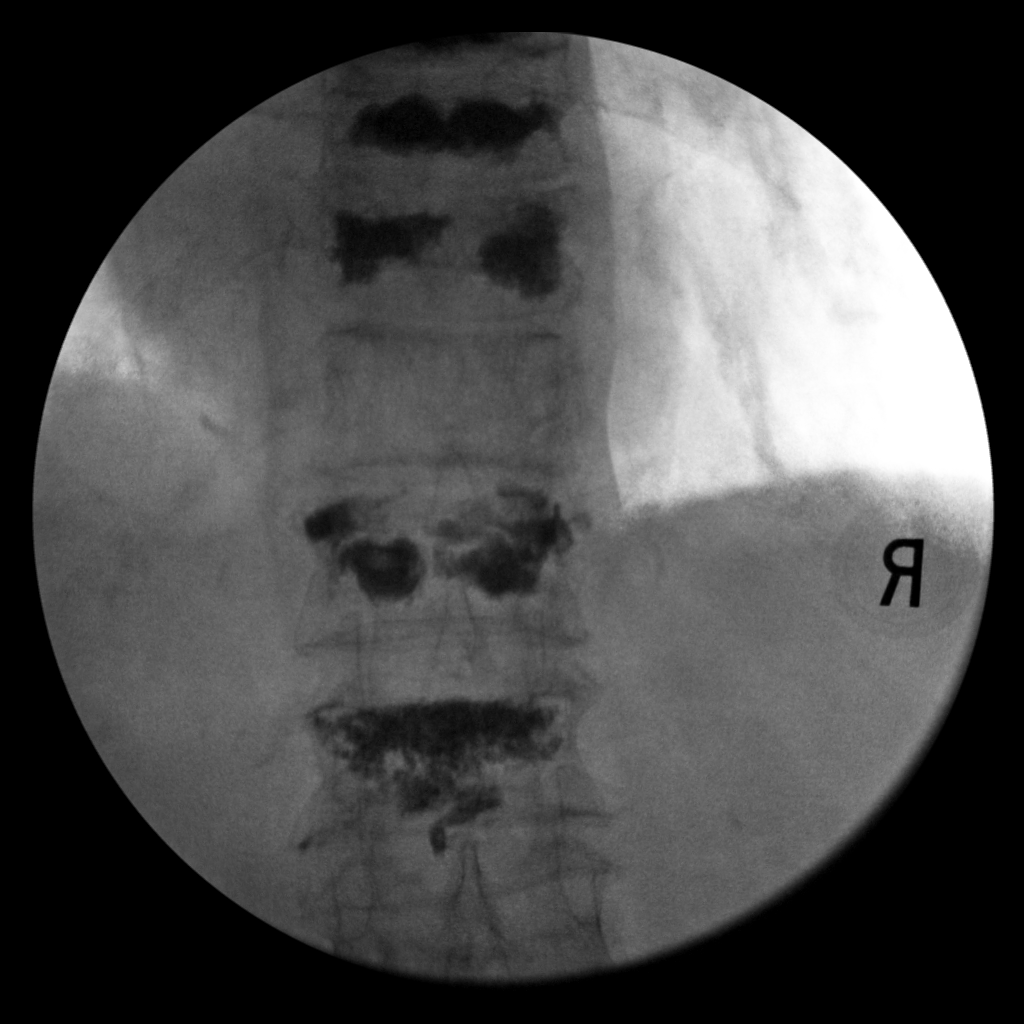

[3 of 3 positions shown; findings below may reference images not displayed]

FINDINGS: AP and lateral view intraprocedural fluoroscopic images of the
thoracolumbar spine are submitted, 3 images total. On the provided
images, kyphoplasty material is present within the T9, T10, T11, L1
and L2 vertebrae at sites of known compression fractures.
Kyphoplasty at the T11 and L1 levels is new as compared to the prior
lumbar spine radiographs of 10/26/2020.
IMPRESSION: Three intraprocedural fluoroscopic images of the thoracolumbar spine
from T11 and L1 kyphoplasty, as described.

## 2023-06-02 IMAGING — CR DG CHEST 2V
2 series · 2 of 2 positions shown · non-contrast
Comparison: PA and lateral chest 03/04/2020. Plain films lumbar
spine 11/16/2020.

CLINICAL DATA: Preoperative examination. Patient for surgery today.

EXAM:
CHEST - 2 VIEW

[w chest pa]
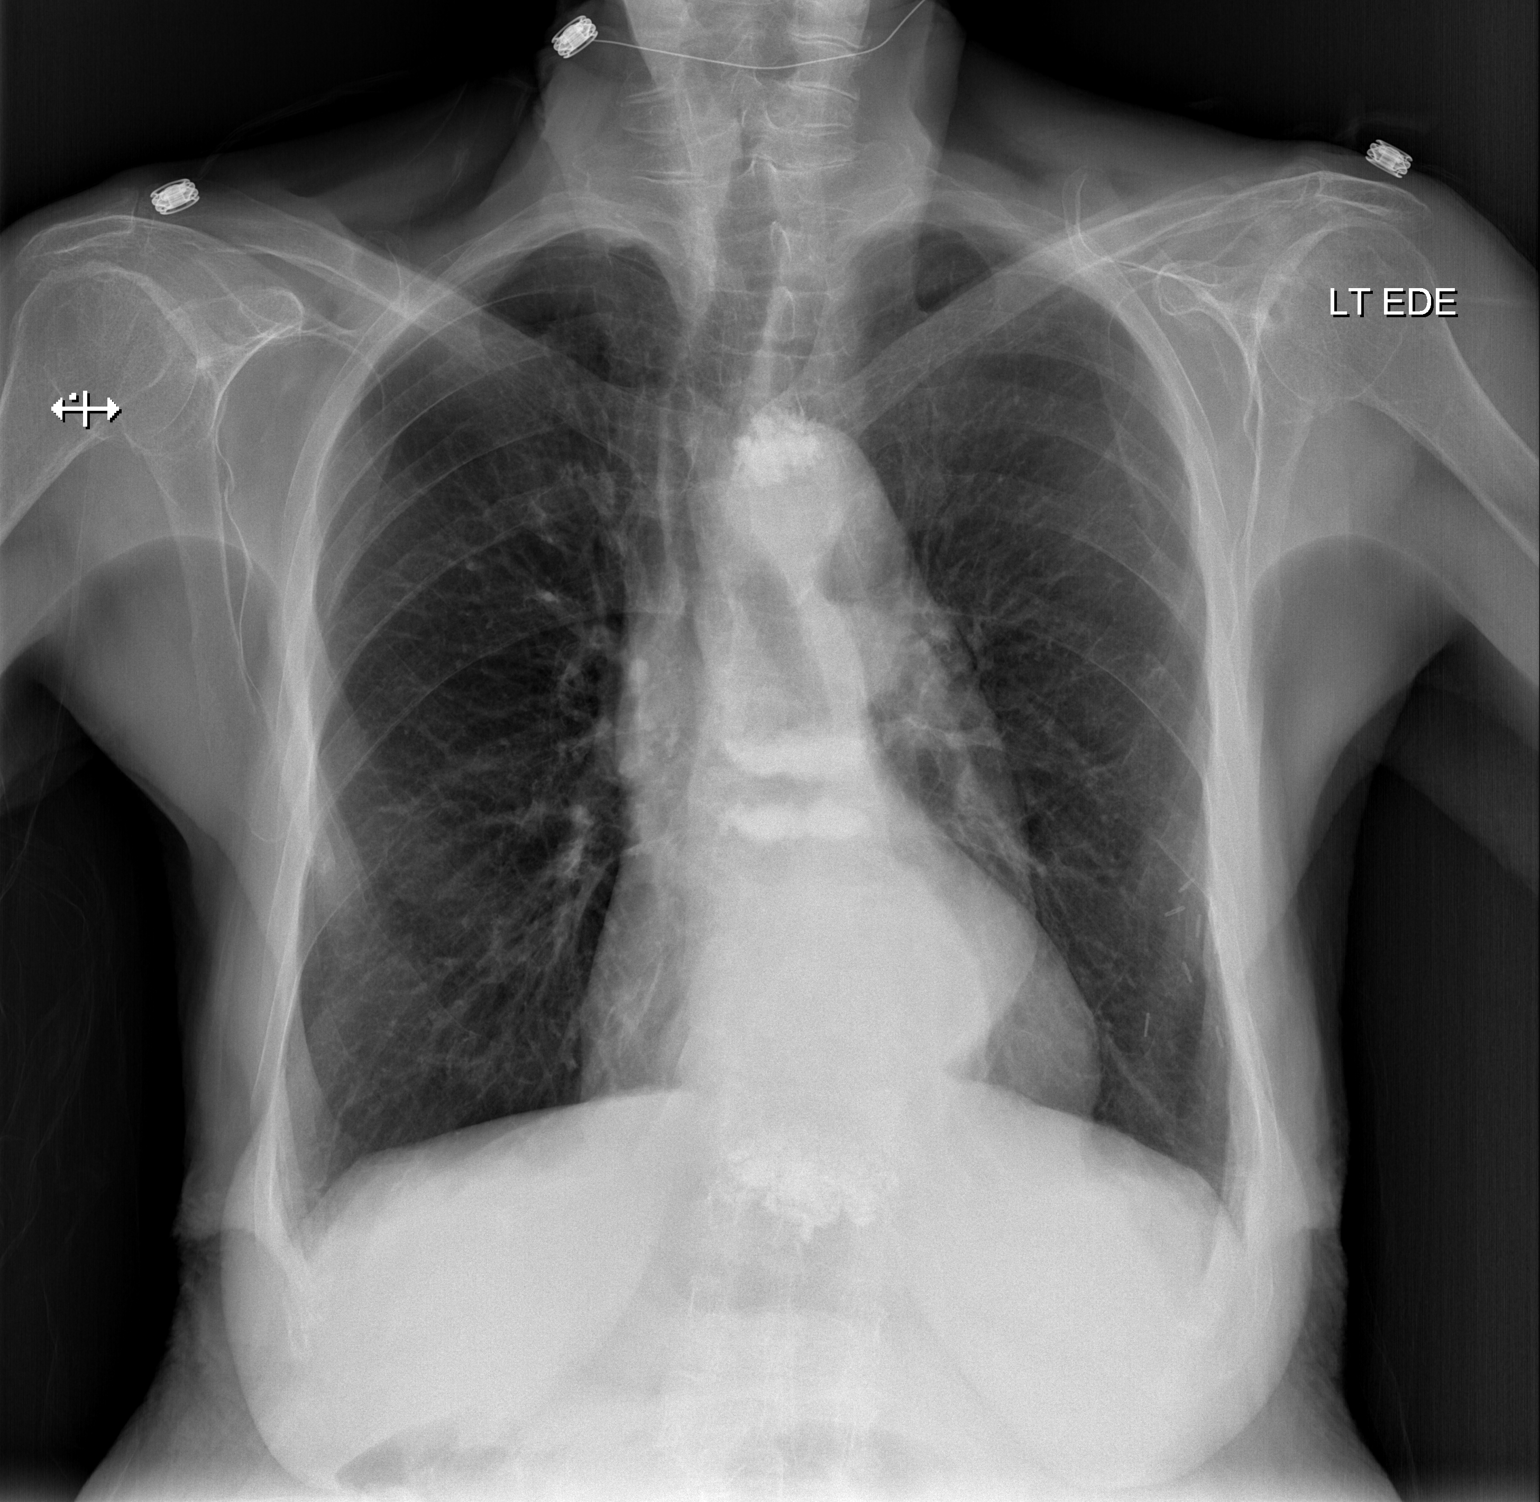

[w chest lat]
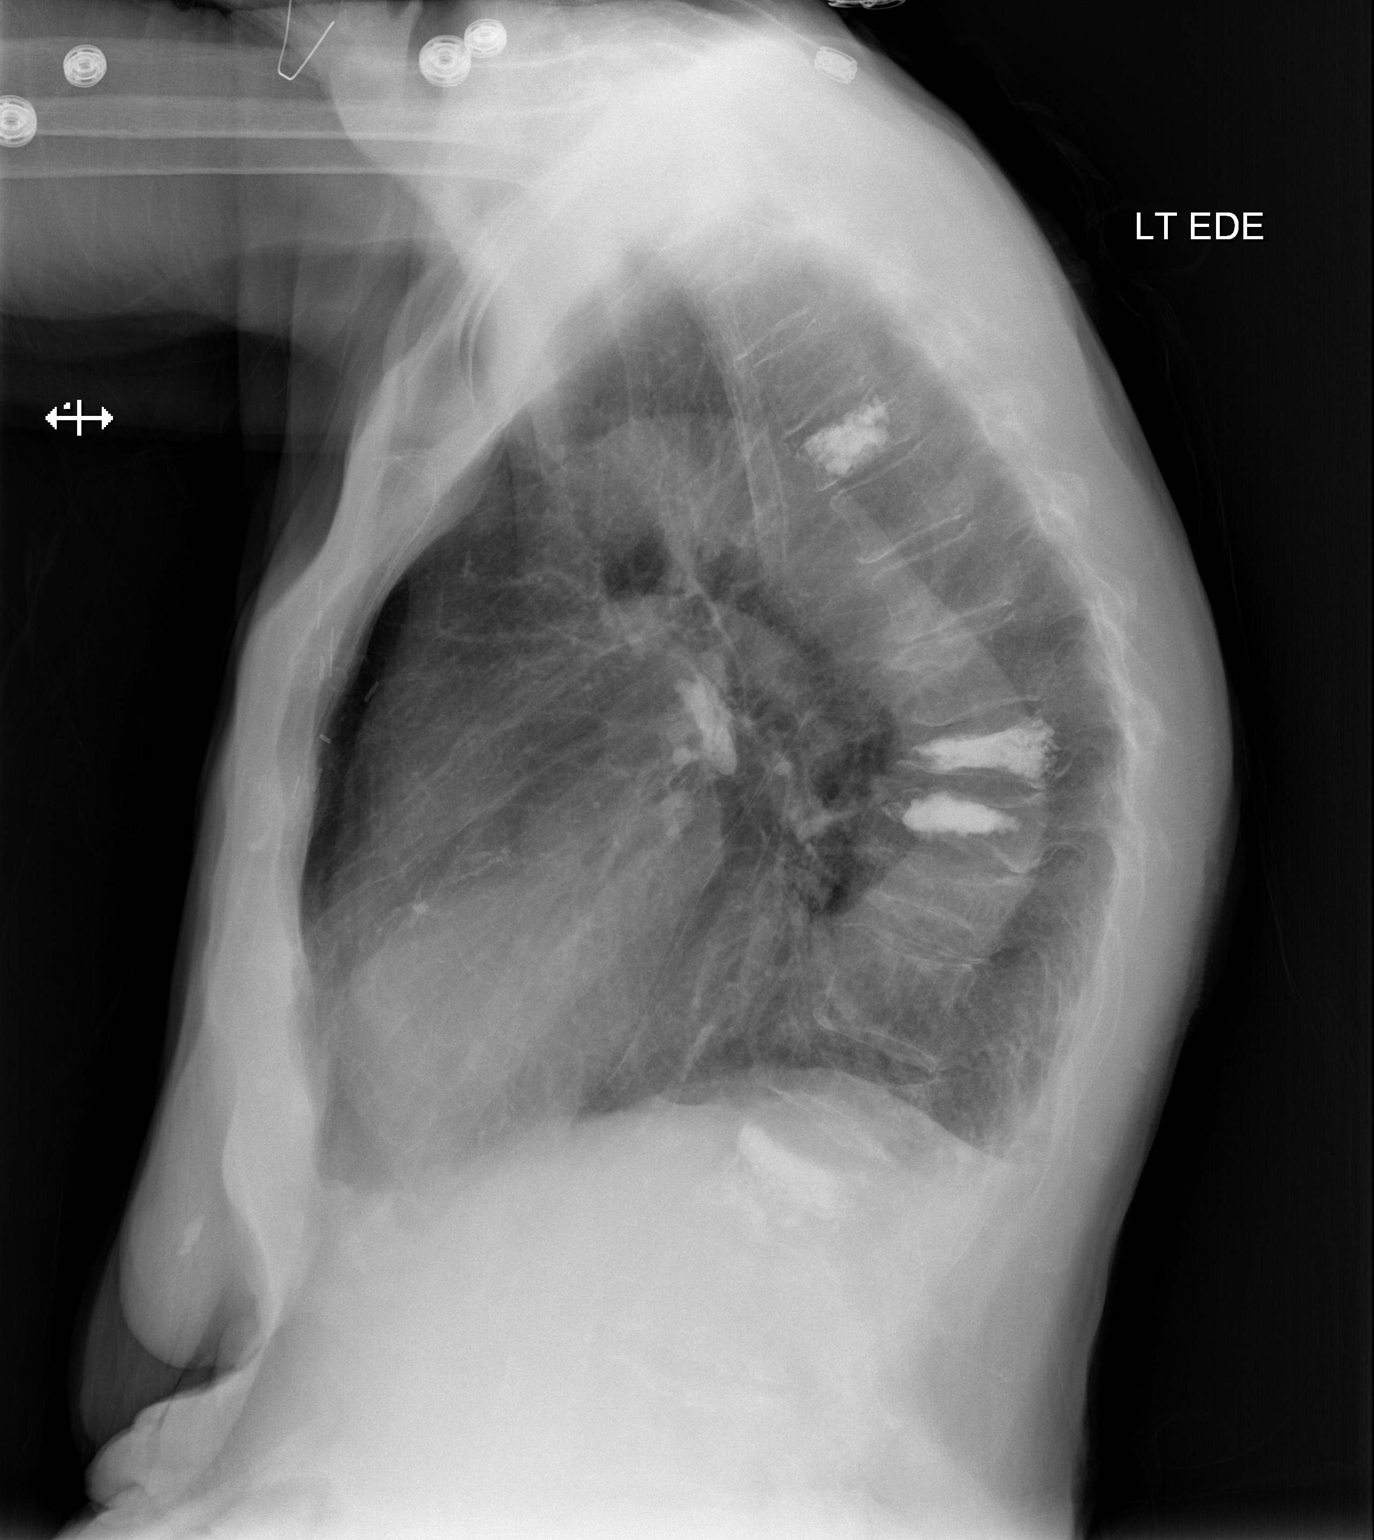

[2 of 2 positions shown; findings below may reference images not displayed]

FINDINGS: The lungs are clear. Heart size is normal. Hiatal hernia noted. No
pneumothorax or pleural fluid. Surgical clips left breast are seen.

As seen on the prior lumbar spine films, there is a severe
compression fracture deformity of T11 which is unchanged in
appearance. There has been worsening of vertebral body height loss
at L1 where there is now near vertebra plana deformity. No new
fracture is identified. Postoperative change of prior vertebral
augmentation noted.
IMPRESSION: Severe T11 compression fracture is unchanged but there has been
worsening of vertebral body height loss at L1 since the prior lumbar
spine plain films. No new fracture.

No acute cardiopulmonary disease.

Hiatal hernia.

## 2023-07-11 IMAGING — CR DG CHEST 2V
2 series · 2 of 2 positions shown · non-contrast
Comparison: Chest radiographs 11/16/2020 and earlier.

CLINICAL DATA: 89-year-old female with chest pain, right rib and
shoulder pain with no known injury.

EXAM:
CHEST - 2 VIEW

[chest lat]
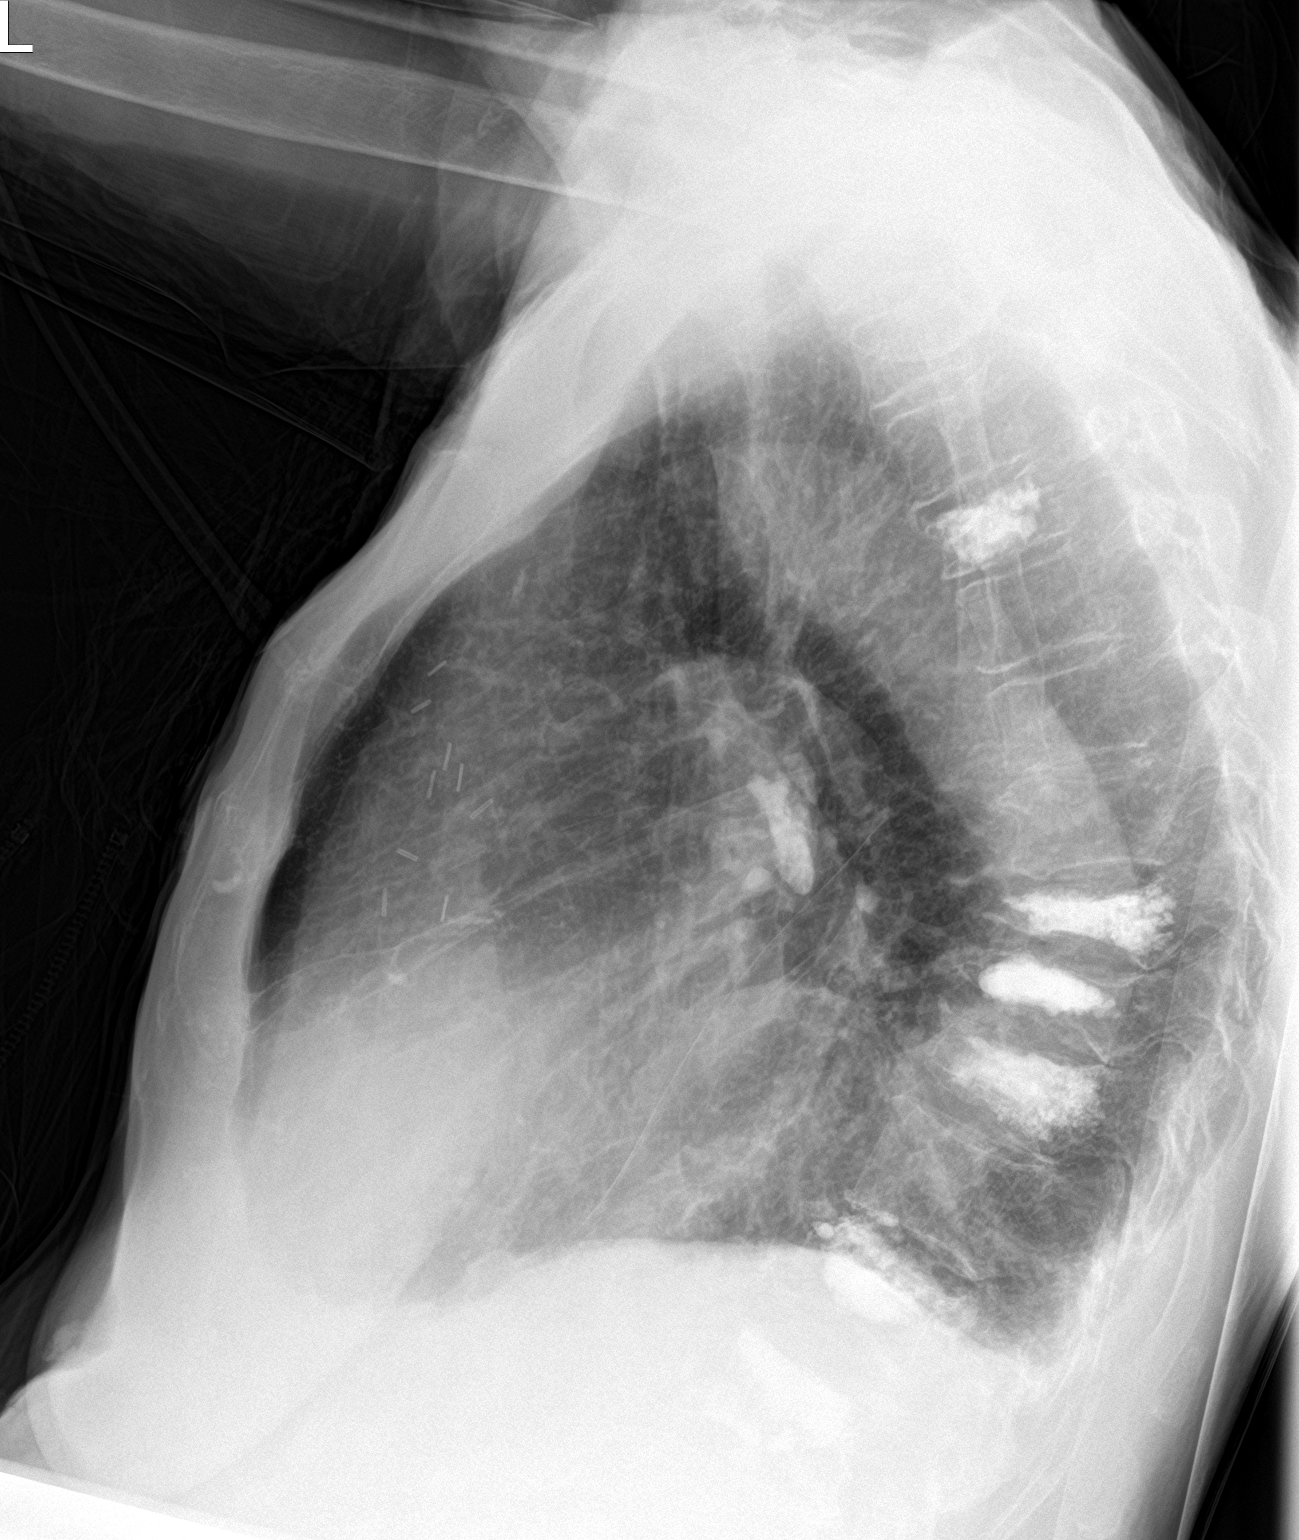

[chest ap]
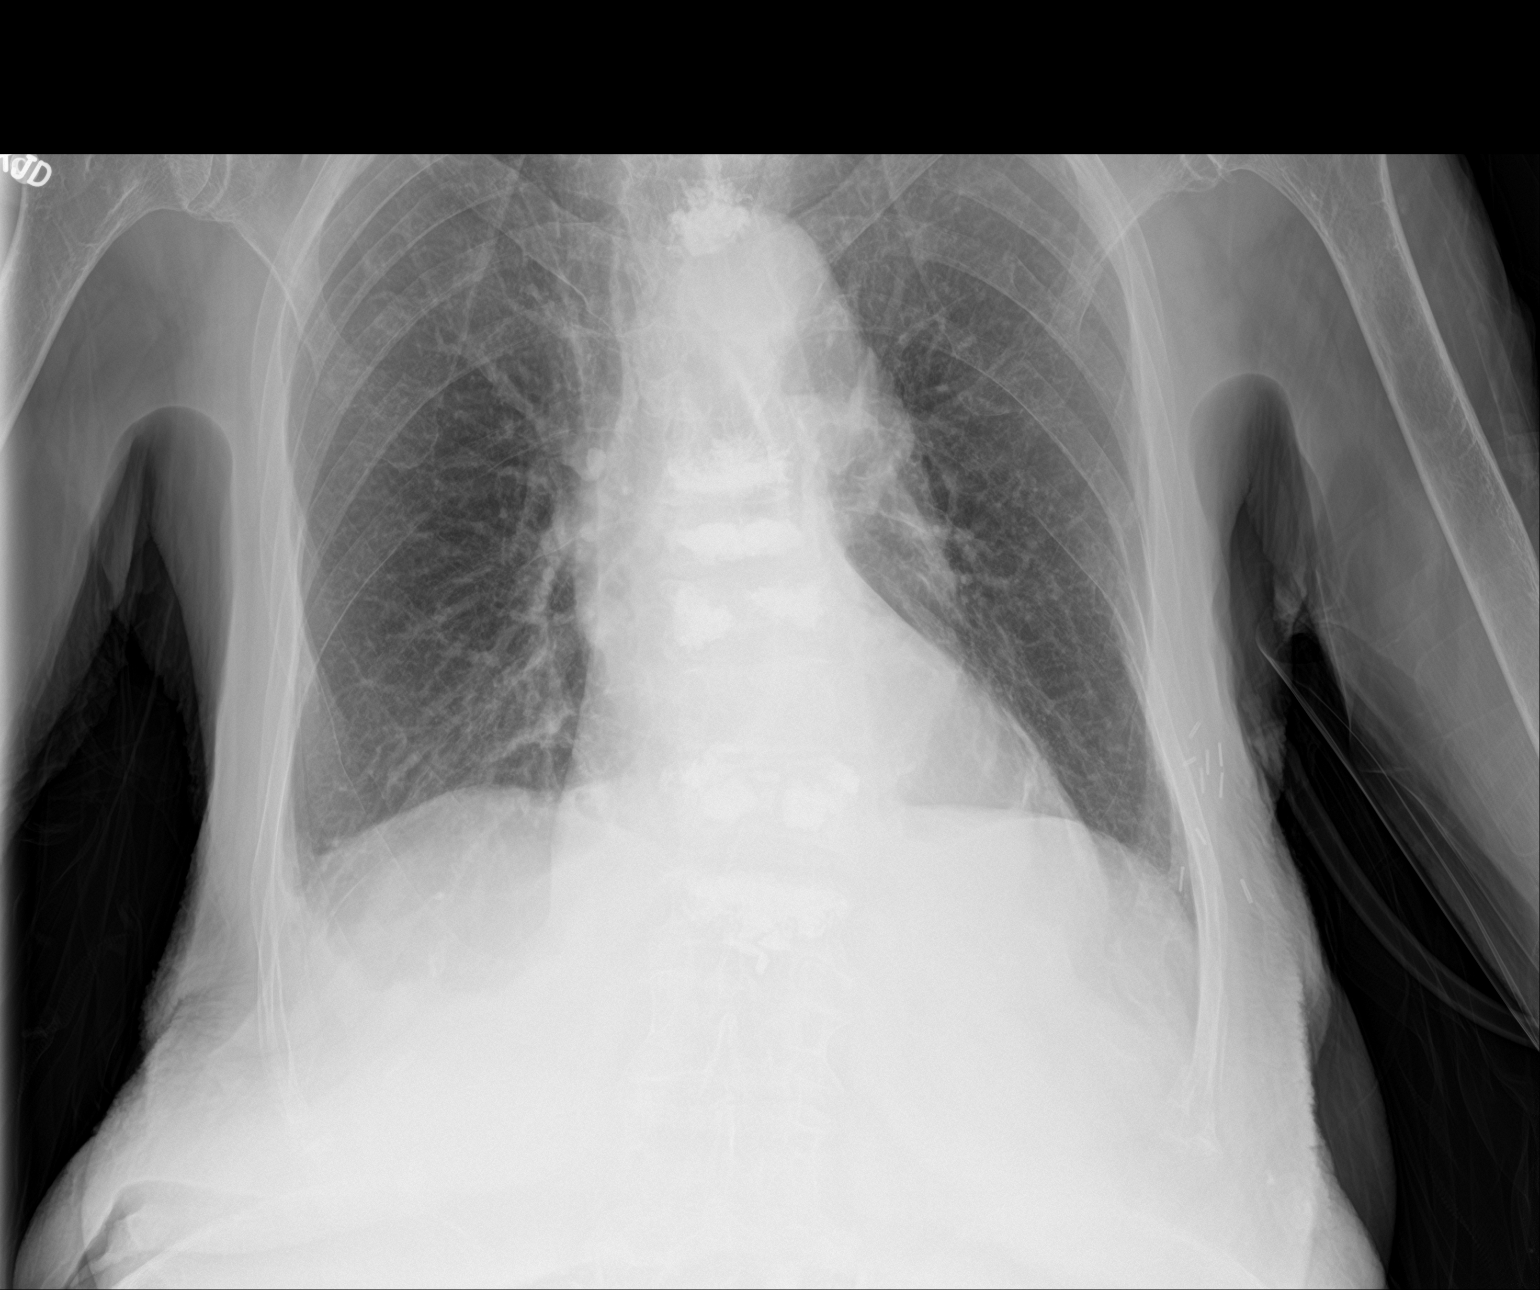

[2 of 2 positions shown; findings below may reference images not displayed]

FINDINGS: Upright PA and lateral views.

Numerous thoracic and upper lumbar compression fractures, at least 6
levels previously augmented. No new osseous abnormality identified.

Mildly lower lung volumes, chronic hyperinflation. Chronically
tortuous thoracic aorta and mild cardiomegaly appears stable.
Visualized tracheal air column is within normal limits. No
pneumothorax, pulmonary edema, pleural effusion or confluent
pulmonary opacity.

Paucity of bowel gas in the upper abdomen. Chronic surgical clips
left breast and/or chest wall.
IMPRESSION: 1. No acute cardiopulmonary abnormality. Chronic cardiomegaly and
tortuous thoracic aorta.
2. Numerous chronic spinal compression fractures, 6 levels
previously augmented. No new osseous abnormality identified.

## 2023-07-11 IMAGING — CT CT ANGIO CHEST
2 of 7 series · 16 of 46 positions shown · IV contrast (omnipaque)
Comparison: None.
COMPARISON: None.

Addendum:
CLINICAL DATA: PE suspected, low/intermediate prob, positive
D-dimer. Right chest pain

EXAM:
CT ANGIOGRAPHY CHEST WITH CONTRAST
TECHNIQUE: Multidetector CT imaging of the chest was performed using the
standard protocol during bolus administration of intravenous
contrast. Multiplanar CT image reconstructions and MIPs were
obtained to evaluate the vascular anatomy.
CONTRAST:  50mL OMNIPAQUE IOHEXOL 350 MG/ML SOLN

[Series 5: thins · axial · 0.61mm/px · z∈[-219,+16]mm · 13 of 378 slices shown]
[im 21/378  lung]
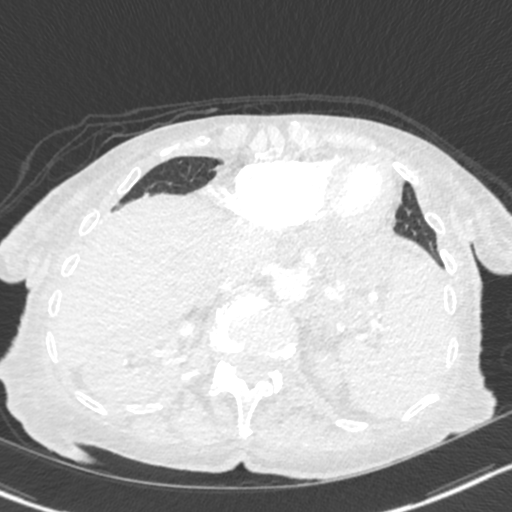
[im 42/378  soft-tissue]
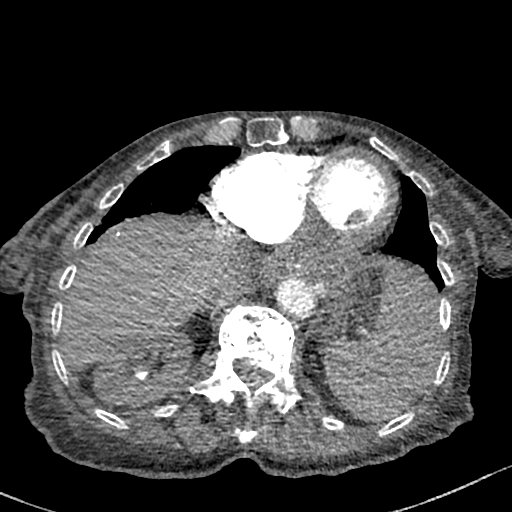
[im 84/378  lung]
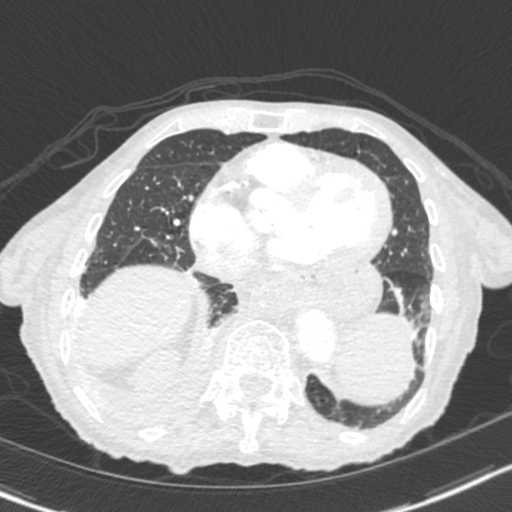
[im 105/378  soft-tissue]
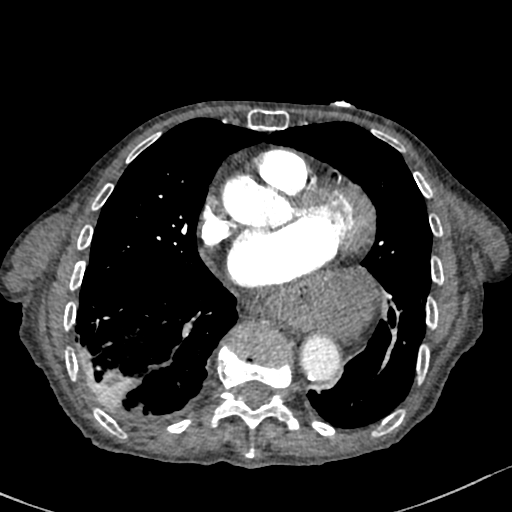
[im 126/378  lung]
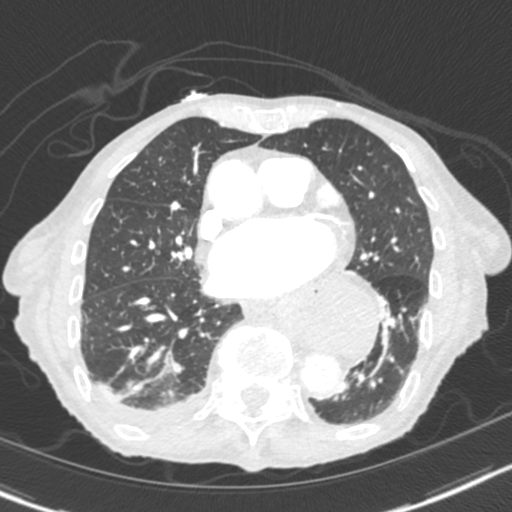
[im 168/378  soft-tissue]
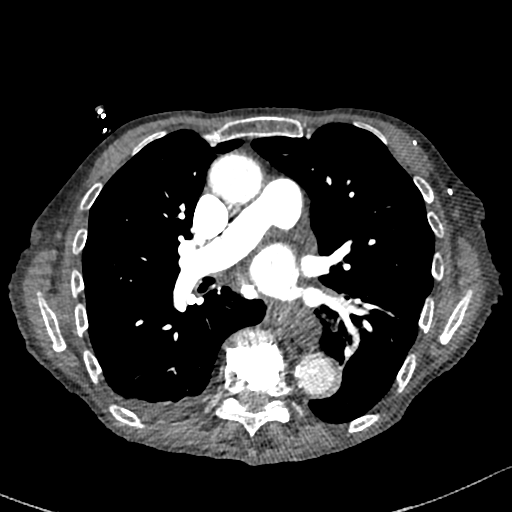
[im 189/378  lung]
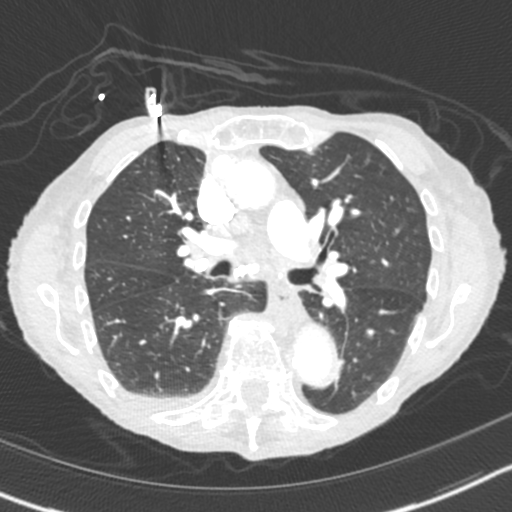
[im 210/378  soft-tissue]
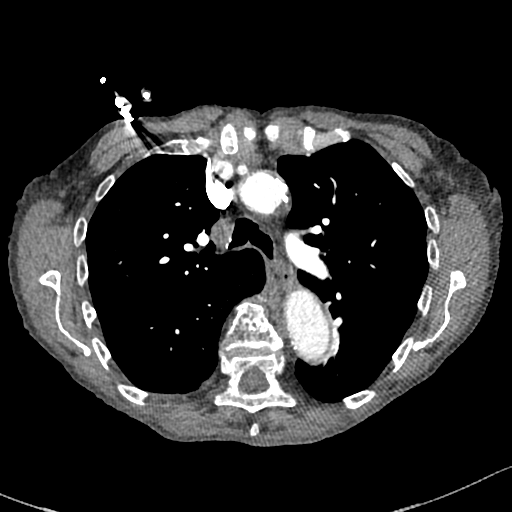
[im 252/378  lung]
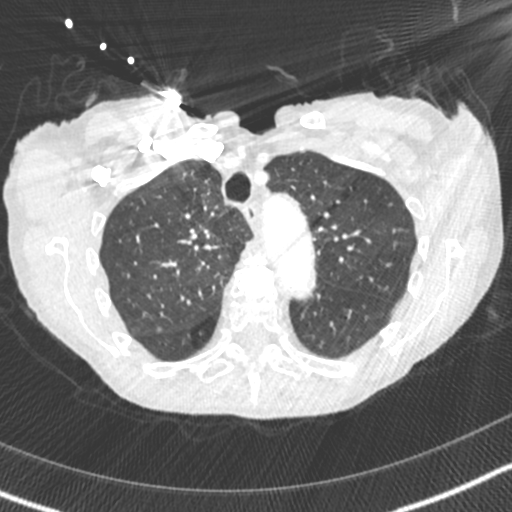
[im 273/378  soft-tissue]
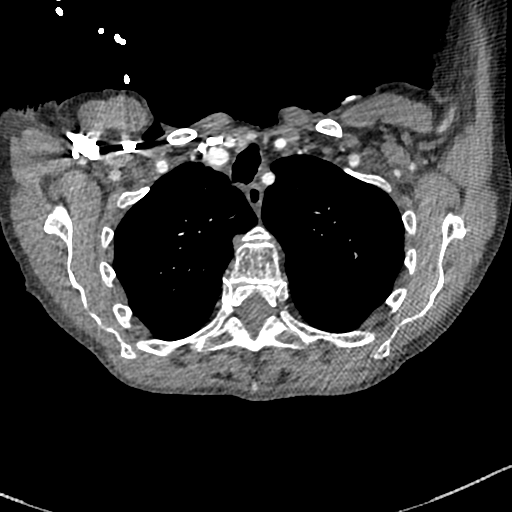
[im 294/378  lung]
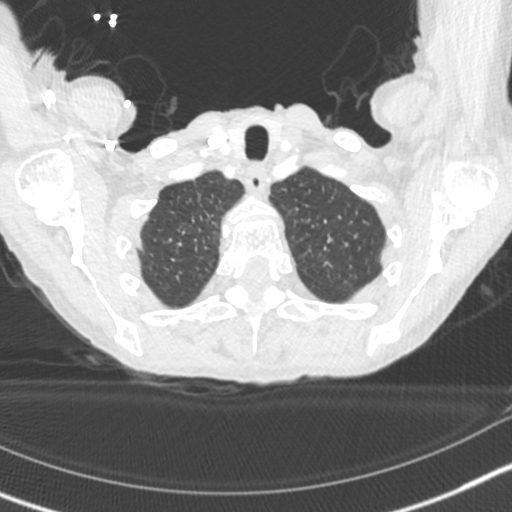
[im 336/378  soft-tissue]
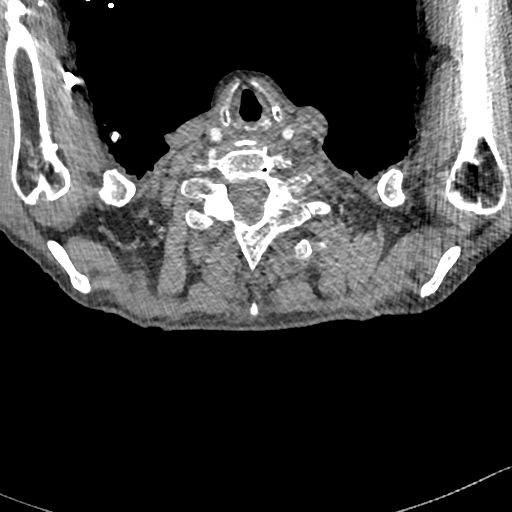
[im 357/378  lung]
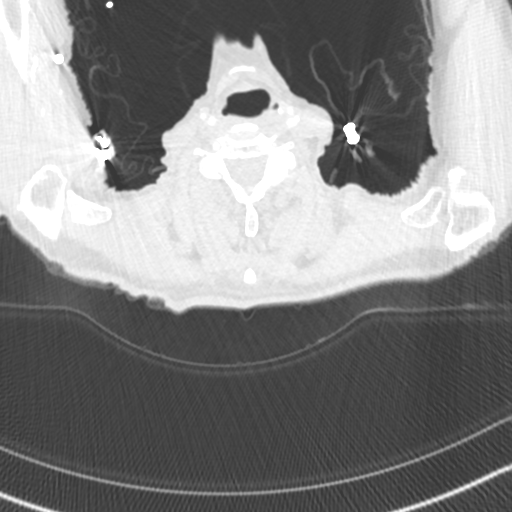

[Series 6: cor · coronal · 0.53mm/px · 3 of 126 slices shown]
[im 32/126  soft-tissue]
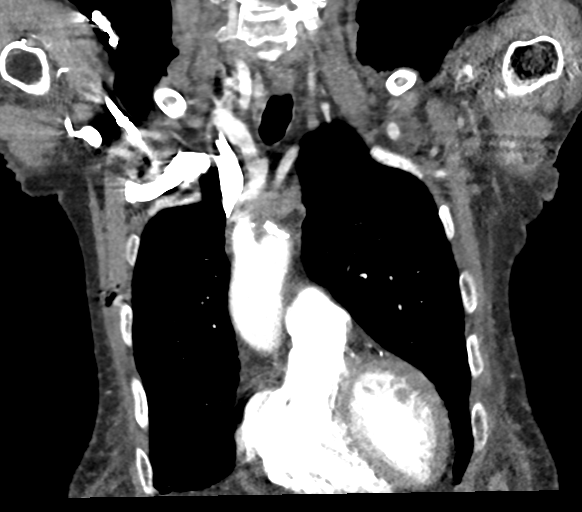
[im 63/126  soft-tissue]
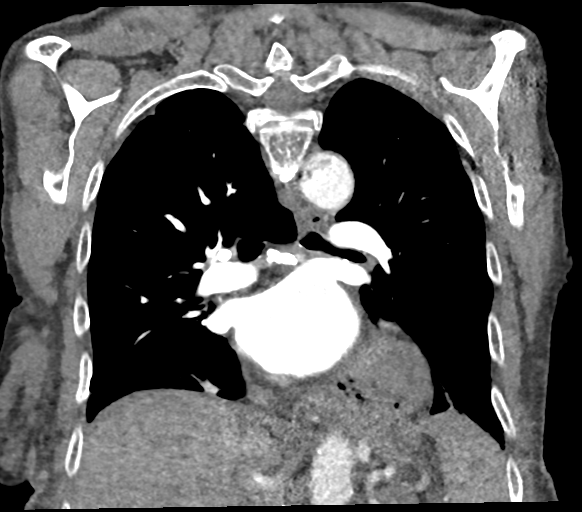
[im 94/126  soft-tissue]
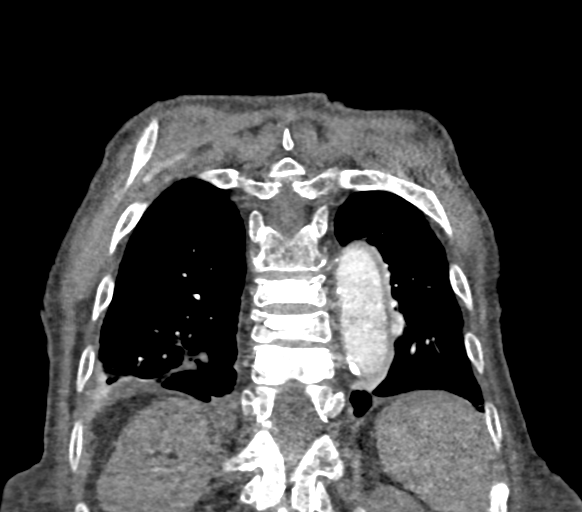

[16 of 46 positions shown; findings below may reference images not displayed]

FINDINGS: Cardiovascular: There is adequate opacification of the pulmonary
arterial tree. There is an intraluminal filling defect identified
within the posterior basal segmental pulmonary artery of the right
lower lobe in keeping with an acute pulmonary embolism. This is best
seen on axial image # 240/5 and coronal image # 91/6. The overall
embolic burden is small. There is no CT evidence of right heart
strain. The central pulmonary arteries are of normal caliber.

Extensive multi-vessel coronary artery calcification. Moderate
calcification of the aortic valve leaflets. Global cardiac size is
within normal limits. No pericardial effusion. Mild atherosclerotic
calcification is seen within the thoracic aorta. No aortic aneurysm.

Mediastinum/Nodes: The visualized thyroid is unremarkable. No
pathologic thoracic adenopathy. Large hiatal hernia. There is debris
within the distal esophagus which may relate to gastroesophageal
reflux or esophageal dysmotility.

Lungs/Pleura: Mild bibasilar atelectasis is present, right greater
than left. Trace right pleural effusion is present. Lungs are
otherwise clear. No pneumothorax. No central obstructing lesion.

Upper Abdomen: No acute abnormality.

Musculoskeletal: No acute bone abnormality. The osseous structures
are diffusely osteopenic. Multilevel vertebroplasty has been
performed throughout the thoracic spine. No acute bone abnormality.

Review of the MIP images confirms the above findings.
IMPRESSION: Acute pulmonary embolism involving the posterior basal segment of
the right lower lobe. Overall embolic burden is small. No CT
evidence of right heart strain. Small right pleural effusion may be
reactive in nature.

Extensive multi-vessel coronary artery calcification.

Moderate calcification of the aortic valve leaflets.
Echocardiography may be helpful to assess the degree of valvular
dysfunction.

Large hiatal hernia. Small debris within the distal esophagus may
relate to esophageal dysmotility or gastroesophageal reflux.

Osteopenia with multilevel thoracic vertebroplasty noted.

Aortic Atherosclerosis (EVDX5-AUO.O).

ADDENDUM:
These results were called by telephone at the time of interpretation
on 12/25/2020 at [DATE] to provider GIRI ASITIMBAY , who verbally
acknowledged these results.

*** End of Addendum ***
FINDINGS: Cardiovascular: There is adequate opacification of the pulmonary
arterial tree. There is an intraluminal filling defect identified
within the posterior basal segmental pulmonary artery of the right
lower lobe in keeping with an acute pulmonary embolism. This is best
seen on axial image # 240/5 and coronal image # 91/6. The overall
embolic burden is small. There is no CT evidence of right heart
strain. The central pulmonary arteries are of normal caliber.

Extensive multi-vessel coronary artery calcification. Moderate
calcification of the aortic valve leaflets. Global cardiac size is
within normal limits. No pericardial effusion. Mild atherosclerotic
calcification is seen within the thoracic aorta. No aortic aneurysm.

Mediastinum/Nodes: The visualized thyroid is unremarkable. No
pathologic thoracic adenopathy. Large hiatal hernia. There is debris
within the distal esophagus which may relate to gastroesophageal
reflux or esophageal dysmotility.

Lungs/Pleura: Mild bibasilar atelectasis is present, right greater
than left. Trace right pleural effusion is present. Lungs are
otherwise clear. No pneumothorax. No central obstructing lesion.

Upper Abdomen: No acute abnormality.

Musculoskeletal: No acute bone abnormality. The osseous structures
are diffusely osteopenic. Multilevel vertebroplasty has been
performed throughout the thoracic spine. No acute bone abnormality.

Review of the MIP images confirms the above findings.
IMPRESSION: Acute pulmonary embolism involving the posterior basal segment of
the right lower lobe. Overall embolic burden is small. No CT
evidence of right heart strain. Small right pleural effusion may be
reactive in nature.

Extensive multi-vessel coronary artery calcification.

Moderate calcification of the aortic valve leaflets.
Echocardiography may be helpful to assess the degree of valvular
dysfunction.

Large hiatal hernia. Small debris within the distal esophagus may
relate to esophageal dysmotility or gastroesophageal reflux.

Osteopenia with multilevel thoracic vertebroplasty noted.

Aortic Atherosclerosis (EVDX5-AUO.O).

## 2023-08-10 IMAGING — DX DG RIBS W/ CHEST 3+V*R*
3 series · 3 of 3 positions shown · non-contrast
Comparison: November 16, 2020.

CLINICAL DATA: Right rib pain.

EXAM:
RIGHT RIBS AND CHEST - 3+ VIEW

[chest pa]
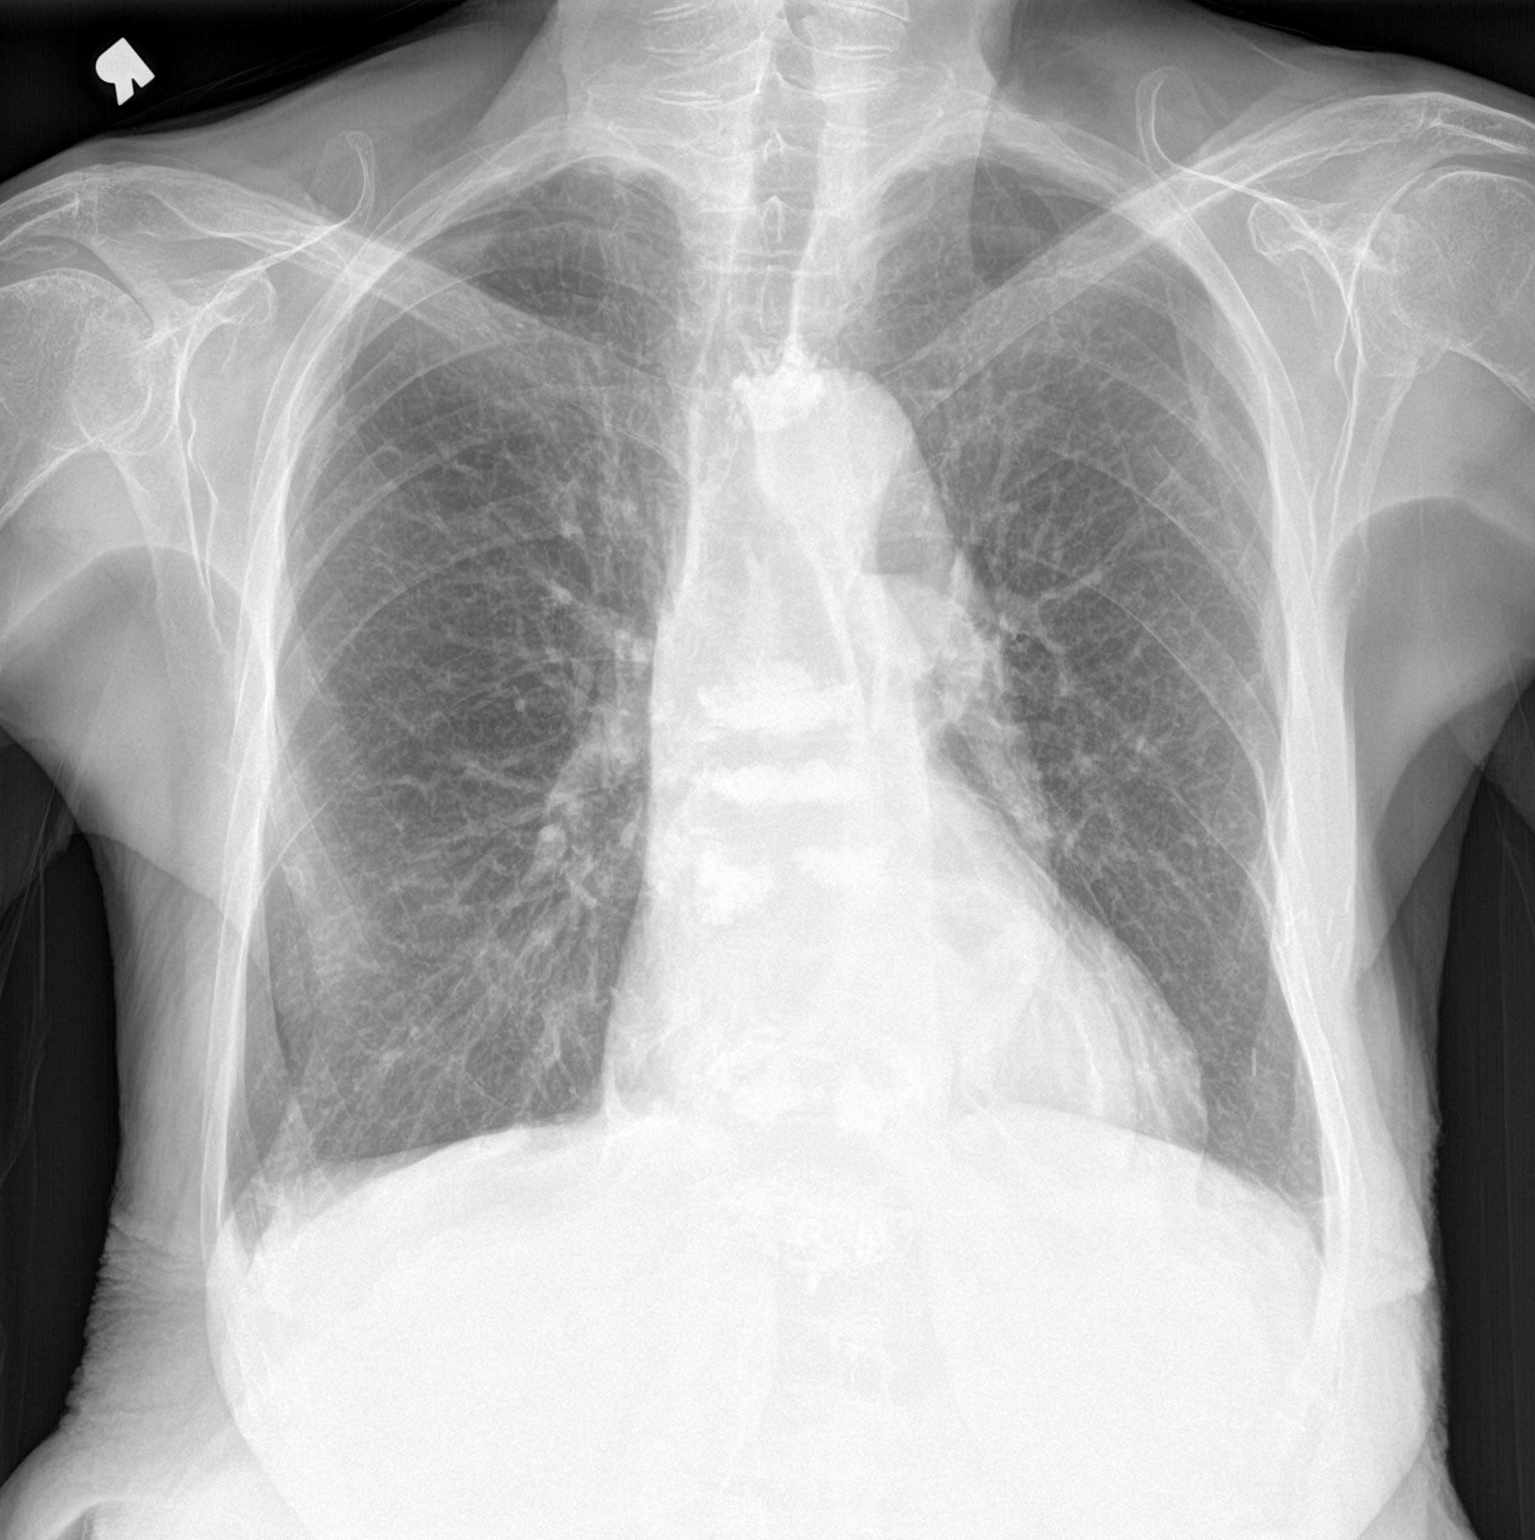

[rib pa]
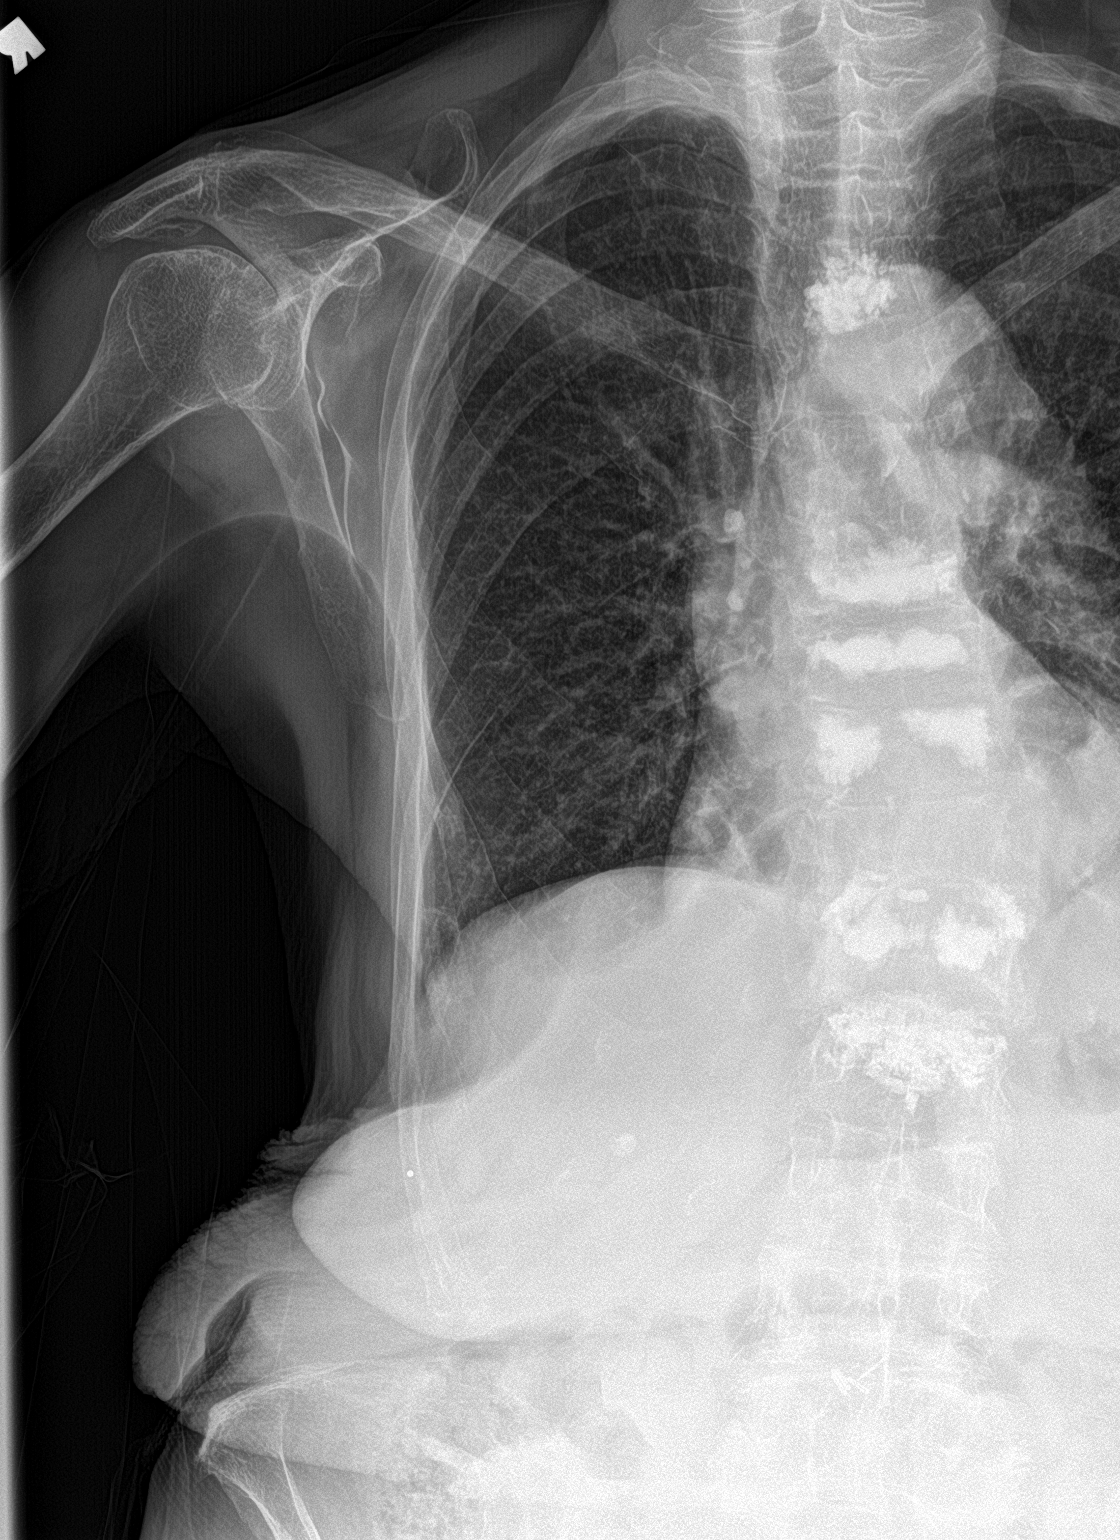

[rib pa obl]
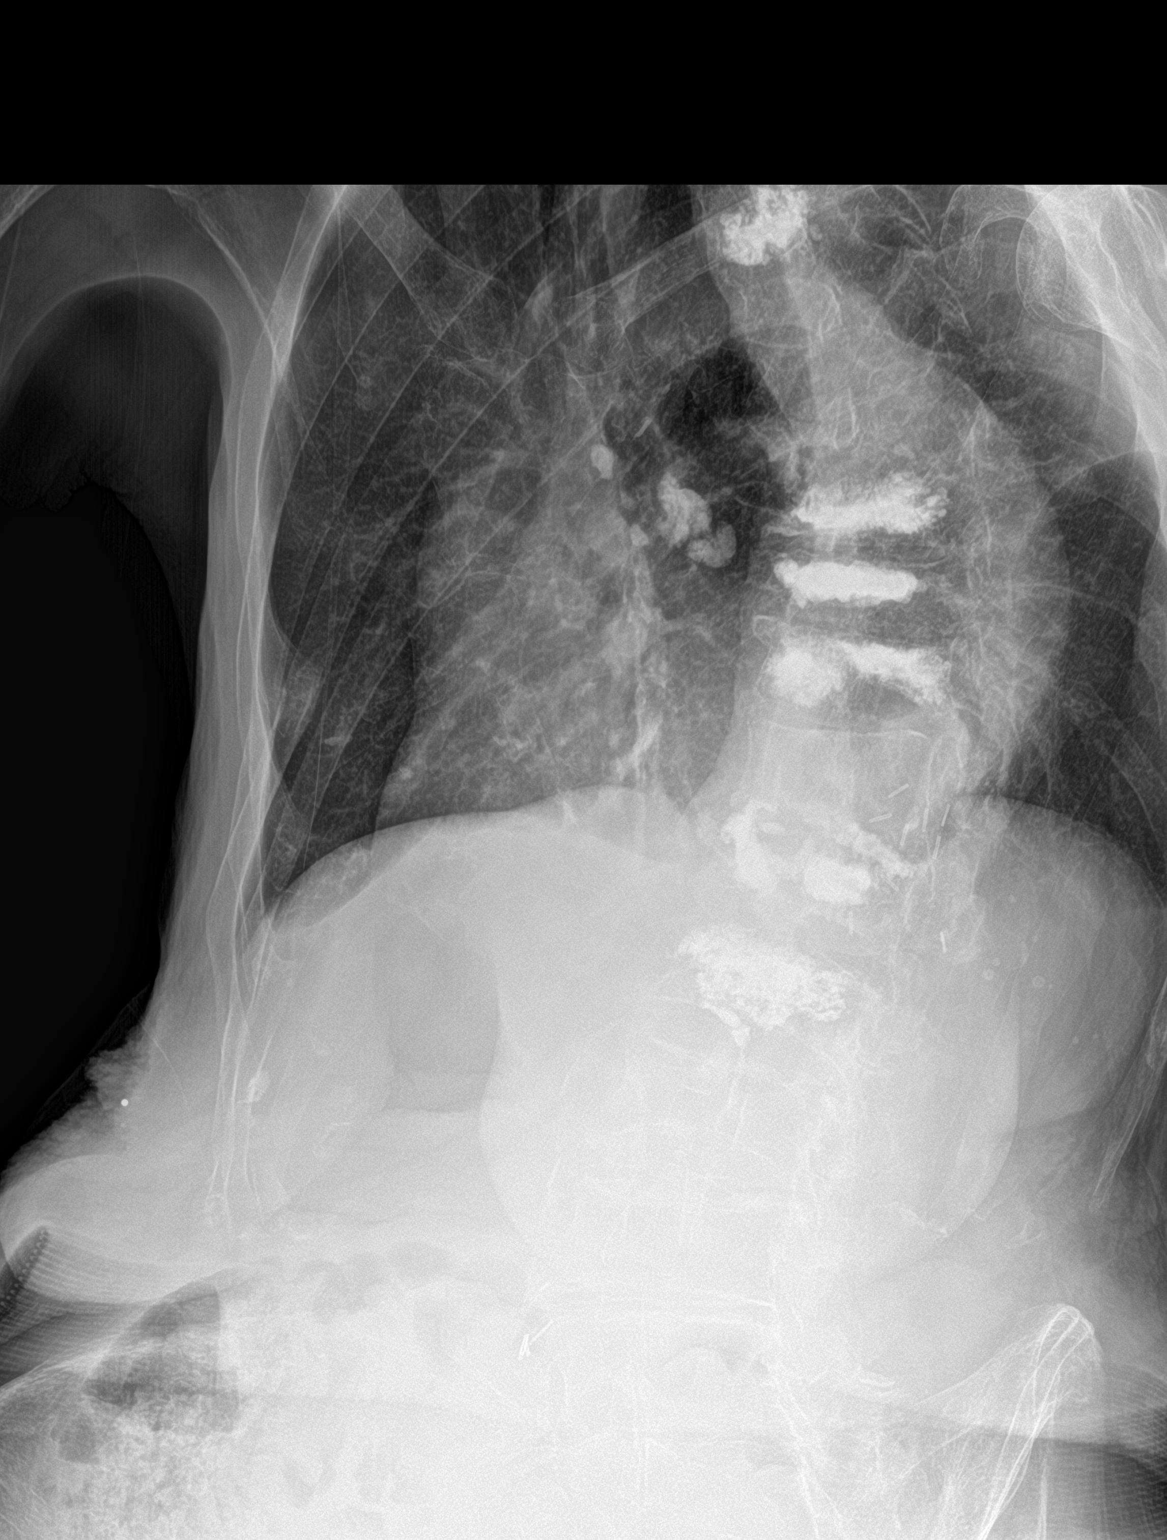

[3 of 3 positions shown; findings below may reference images not displayed]

FINDINGS: No fracture or other bone lesions are seen involving the ribs. There
is no evidence of pneumothorax or pleural effusion. Both lungs are
clear. Heart size and mediastinal contours are within normal limits.
IMPRESSION: Negative.

## 2023-10-10 ENCOUNTER — Other Ambulatory Visit: Payer: Self-pay

## 2023-10-10 ENCOUNTER — Emergency Department (HOSPITAL_COMMUNITY)

## 2023-10-10 ENCOUNTER — Emergency Department (HOSPITAL_COMMUNITY)
Admission: EM | Admit: 2023-10-10 | Discharge: 2023-10-10 | Disposition: A | Discharge status: Home / Self Care | Attending: Emergency Medicine | Admitting: Emergency Medicine

## 2023-10-10 ENCOUNTER — Encounter: Payer: Self-pay | Admitting: Hematology & Oncology

## 2023-10-10 DIAGNOSIS — Z79899 Other long term (current) drug therapy: Secondary | ICD-10-CM | POA: Insufficient documentation

## 2023-10-10 DIAGNOSIS — W06XXXA Fall from bed, initial encounter: Secondary | ICD-10-CM | POA: Insufficient documentation

## 2023-10-10 DIAGNOSIS — I110 Hypertensive heart disease with heart failure: Secondary | ICD-10-CM | POA: Insufficient documentation

## 2023-10-10 DIAGNOSIS — Z853 Personal history of malignant neoplasm of breast: Secondary | ICD-10-CM | POA: Diagnosis not present

## 2023-10-10 DIAGNOSIS — I5032 Chronic diastolic (congestive) heart failure: Secondary | ICD-10-CM | POA: Diagnosis not present

## 2023-10-10 DIAGNOSIS — M25512 Pain in left shoulder: Secondary | ICD-10-CM | POA: Diagnosis not present

## 2023-10-10 DIAGNOSIS — R079 Chest pain, unspecified: Secondary | ICD-10-CM | POA: Insufficient documentation

## 2023-10-10 DIAGNOSIS — W19XXXA Unspecified fall, initial encounter: Secondary | ICD-10-CM

## 2023-10-10 DIAGNOSIS — Y92129 Unspecified place in nursing home as the place of occurrence of the external cause: Secondary | ICD-10-CM | POA: Diagnosis not present

## 2023-10-10 DIAGNOSIS — I25119 Atherosclerotic heart disease of native coronary artery with unspecified angina pectoris: Secondary | ICD-10-CM | POA: Insufficient documentation

## 2023-10-10 DIAGNOSIS — M79622 Pain in left upper arm: Secondary | ICD-10-CM | POA: Diagnosis not present

## 2023-10-10 LAB — BASIC METABOLIC PANEL WITH GFR
Anion gap: 11 (ref 5–15)
BUN: 10 mg/dL (ref 8–23)
CO2: 24 mmol/L (ref 22–32)
Calcium: 9.3 mg/dL (ref 8.9–10.3)
Chloride: 100 mmol/L (ref 98–111)
Creatinine, Ser: 0.53 mg/dL (ref 0.44–1.00)
GFR, Estimated: >60 mL/min (ref >60)
Glucose, Bld: 112 mg/dL — ABNORMAL HIGH (ref 70–99)
Potassium: 3.4 mmol/L — ABNORMAL LOW (ref 3.5–5.1)
Sodium: 135 mmol/L (ref 135–145)

## 2023-10-10 LAB — CBC WITH DIFFERENTIAL/PLATELET
Abs Immature Granulocytes: 0.05 10*3/uL (ref 0.00–0.07)
Basophils Absolute: 0.1 10*3/uL (ref 0.0–0.1)
Basophils Relative: 1 %
Eosinophils Absolute: 0.1 10*3/uL (ref 0.0–0.5)
Eosinophils Relative: 2 %
HCT: 43 % (ref 36.0–46.0)
Hemoglobin: 12.8 g/dL (ref 12.0–15.0)
Immature Granulocytes: 1 %
Lymphocytes Relative: 8 %
Lymphs Abs: 0.6 10*3/uL — ABNORMAL LOW (ref 0.7–4.0)
MCH: 23.1 pg — ABNORMAL LOW (ref 26.0–34.0)
MCHC: 29.8 g/dL — ABNORMAL LOW (ref 30.0–36.0)
MCV: 77.8 fL — ABNORMAL LOW (ref 80.0–100.0)
Monocytes Absolute: 0.8 10*3/uL (ref 0.1–1.0)
Monocytes Relative: 10 %
Neutro Abs: 5.7 10*3/uL (ref 1.7–7.7)
Neutrophils Relative %: 78 %
Platelets: 370 10*3/uL (ref 150–400)
RBC: 5.53 MIL/uL — ABNORMAL HIGH (ref 3.87–5.11)
RDW: 16.9 % — ABNORMAL HIGH (ref 11.5–15.5)
WBC: 7.4 10*3/uL (ref 4.0–10.5)
nRBC: 0 % (ref 0.0–0.2)

## 2023-10-10 MED ORDER — ACETAMINOPHEN 500 MG PO TABS
1000.0000 mg | ORAL_TABLET | Freq: Four times a day (QID) | ORAL | Status: DC | PRN
  Administered 2023-10-10: 1000 mg via ORAL
  Filled 2023-10-10: qty 2

## 2023-10-10 MED ORDER — HYDROMORPHONE HCL 1 MG/ML IJ SOLN
0.5000 mg | Freq: Once | INTRAMUSCULAR | Status: AC
  Administered 2023-10-10: 0.5 mg via INTRAVENOUS
  Filled 2023-10-10: qty 1

## 2023-10-10 NOTE — ED Provider Notes (Signed)
[place] EMERGENCY DEPARTMENT AT [facility] Provider Note  CSN: [id number] Arrival date & time: 10/10/23 0865  Chief Complaint(s) Fall  HPI [name] is a 89 y.o. female here after mechanical fall at a skilled nursing facility.  Patient reports falling while trying to get into bed stating that when she went to go sit down, she missed the bed causing her to fall onto her left side.  She denies any head trauma.  Denies any headache, neck pain.  Endorses left shoulder, left chest and left upper extremity pain.  Denies any abdominal pain, hip pain or other extremity pain.  No loss of consciousness.  The history is provided by the patient.    Past Medical History Past Medical History:  Diagnosis Date   CAD (coronary artery disease)    mild nonobstructive disease on cath in 2003   Cancer [facility])    Cardiomyopathy    Probable Takotsubo, severe CP w/ normal cath in 1994. Severe CP in 2003 w/ widespread T wave inversions on ECG. Cath w/ minimal coronary disease but LV-gram showed periapical severe hypokinesis and basilar hyperkinesia (EF 40%). Last echo in 4/09 confirmed full LV functional recovery with EF 60%, no regional wall motion abnormalities, mild to moderate LVH.   Chronic diastolic CHF (congestive heart failure) (HCC) 09/13/2019   CVA (cerebral infarction)    Depression with anxiety 03/29/2011   lost husband 3'09   DVT (deep venous thrombosis) (HCC)    after venous ablation, R leg   Fracture 09/10/2007   L2, status post vertebroplasty of L2 performed by IR   GERD (gastroesophageal reflux disease)    Hemorrhoids    Hiatal hernia 03/29/2011   no nerve problems   Hyperlipidemia    Hyperplastic colon polyp 06/2001   Hypertension    Iron deficiency anemia due to chronic blood loss 12/30/2017   OA (osteoarthritis) of knee 03/29/2011   w/ bilateral knee pain-not a problem now   OSA (obstructive sleep apnea) 03/29/2011   no cpap used, not a problem now.    Osteoporosis    Otalgia of both ears    Dr. Johnathan Myron   Polycythemia    Stroke [facility]) 03/29/2011   CVA x2 -last 10'12-?TIA(visual problems)   Varicose vein    Patient Active Problem List   Diagnosis Date Noted   Compression fracture of T2 vertebra (HCC) 02/03/2023   Hypokalemia 02/03/2023   Leukocytosis 02/03/2023   Chronic respiratory failure with hypoxia (HCC) 02/03/2023   Compression fracture of C7 vertebra (HCC) 10/01/2022   Compression fracture of T3 vertebra (HCC) 10/01/2022   Subdural hematoma (HCC) 09/29/2022   Humerus fracture 07/24/2022   Presence of drug-eluting stent in right coronary artery    Dilutional hyponatremia 02/25/2022   ST elevation myocardial infarction involving right coronary artery (HCC)    Paroxysmal atrial fibrillation (HCC)    Closed right radial fracture 11/25/2021   Protein-calorie malnutrition, severe 10/09/2021   Compression fracture of T7 vertebra  10/07/2021   Rib pain on right side 01/24/2021   PE (pulmonary thromboembolism) (HCC) 12/25/2020   Lumbar compression fracture (HCC) 11/16/2020   GERD (gastroesophageal reflux disease)    DVT (deep venous thrombosis) (HCC)    Diverticulitis    Coronary artery disease involving native coronary artery of native heart with unstable angina pectoris (HCC)    Acute midline thoracic back pain 03/04/2020   Closed left hip fracture (HCC) 12/24/2019   ICH (intracerebral hemorrhage) (HCC) 11/04/2019   Intractable back pain 09/13/2019  Compression fracture of T9 vertebra (HCC) 09/13/2019   Chronic diastolic CHF (congestive heart failure) (HCC) 09/13/2019   Overweight (BMI 25.0-29.9) 09/13/2019   Constipation 09/13/2019   Raynaud's phenomenon without gangrene 05/19/2018   Iron deficiency anemia due to chronic blood loss 12/30/2017   Insomnia 06/25/2017   Superficial thrombophlebitis 04/06/2014   Breast cancer (HCC) 11/05/2011   Polycythemia 11/05/2011   Stroke (HCC) 03/29/2011   OSA (obstructive sleep apnea)  03/29/2011   OA (osteoarthritis) of knee 03/29/2011   Hiatal hernia 03/29/2011   E. coli gastroenteritis 03/29/2011   Depression with anxiety 03/29/2011   TIA (transient ischemic attack) 01/16/2011   CARDIOMYOPATHY 08/03/2009   DJD -- pain mgmt 08/16/2008   GAIT DISTURBANCE 10/21/2007   ANXIETY DEPRESSION 10/08/2007   Hyperlipidemia with target LDL less than 70 06/25/2007   Takotsubo cardiomyopathy 06/25/2007   GERD without esophagitis 06/25/2007   Essential hypertension 09/30/2006   Migraine with aura 09/30/2006   Hyperplastic colon polyp 06/2001   Home Medication(s) Prior to Admission medications   Medication Sig Start Date End Date Taking? Authorizing Provider  acetaminophen  (TYLENOL ) 500 MG tablet Take 1,000 mg by mouth every 8 (eight) hours as needed (for pain).    [provider]  alendronate  (FOSAMAX ) 70 MG tablet Take 1 tablet (70 mg total) by mouth once a week. Take with a full glass of water  on an empty stomach. 12/19/21   Paz, Jose E, MD  calcium  carbonate (TUMS - DOSED IN MG ELEMENTAL CALCIUM ) 500 MG chewable tablet Chew 1 tablet (200 mg of elemental calcium  total) by mouth 3 (three) times daily as needed for indigestion or heartburn. 07/28/22   Ghimire, Kuber, MD  clopidogrel  (PLAVIX ) 75 MG tablet Take 1 tablet (75 mg total) by mouth daily. 03/01/22   Sanjuanita Cruz, NP  feeding supplement (ENSURE ENLIVE / ENSURE PLUS) LIQD Take 237 mLs by mouth daily. 02/13/23   Leona Rake, MD  furosemide  (LASIX ) 20 MG tablet Take 20 mg by mouth daily. 01/29/23   [provider]  hydrALAZINE  (APRESOLINE ) 50 MG tablet Take 1 tablet (50 mg total) by mouth every 8 (eight) hours as needed (BP>160/90 MMHG.). 02/12/23 03/14/23  Magdalene School, MD  lidocaine  (LIDODERM ) 5 % Place 1 patch onto the skin daily. Remove & Discard patch within 12 hours or as directed by MD Patient taking differently: Place 1 patch onto the skin daily. Remove & Discard patch within 12 hours or as  directed by MD Apply to right shoulder and left rib area 10/10/22   Theadore Finger, MD  LORazepam  (ATIVAN ) 0.5 MG tablet Take 0.25 mg by mouth 2 (two) times daily.    [provider]  nitroGLYCERIN  (NITROSTAT ) 0.4 MG SL tablet Place 1 tablet (0.4 mg total) under the tongue every 5 (five) minutes x 3 doses as needed for chest pain. 02/28/22   Sanjuanita Cruz, NP  ondansetron  (ZOFRAN -ODT) 4 MG disintegrating tablet Take 4 mg by mouth every 8 (eight) hours as needed for nausea or vomiting. 08/24/22   [provider]  pantoprazole  (PROTONIX ) 40 MG tablet Take 1 tablet (40 mg total) by mouth at bedtime. Patient taking differently: Take 40 mg by mouth 2 (two) times daily. 02/28/22   Sanjuanita Cruz, NP  polyethylene glycol (MIRALAX ) 17 g packet Take 17 g by mouth daily. 02/13/23   Leona Rake, MD  rosuvastatin  (CRESTOR ) 20 MG tablet Take 1 tablet (20 mg total) by mouth daily. Patient taking differently: Take 20 mg by mouth at  bedtime. 03/01/22   Sanjuanita Cruz, NP  senna (SENOKOT) 8.6 MG TABS tablet Take 1 tablet (8.6 mg total) by mouth 2 (two) times daily. 02/13/23   Leona Rake, MD  sertraline  (ZOLOFT ) 25 MG tablet Take 25 mg by mouth at bedtime. 06/18/22   [provider]  UNABLE TO FIND Take 120 mLs by mouth in the morning, at noon, and at bedtime. Med Name: Med pass    [provider]  UNABLE TO FIND Take 1 Dose by mouth 2 (two) times daily with a meal. Med Name: Manufacturing engineer, Historical, MD                                                                                                                                    Allergies Penicillins, Valsartan, Atorvastatin, Carvedilol , Ezetimibe, Fluvastatin sodium, Magnesium  hydroxide, Meloxicam, Pneumovax [pneumococcal polysaccharide vaccine], Quinapril hcl, Simvastatin, Topamax [topiramate], and Vit d-vit e-safflower oil  Review of Systems Review of Systems As noted in HPI  Physical Exam Vital  Signs  I have reviewed the triage vital signs BP 138/80   Pulse 72   Temp 97.7 F (36.5 C)   Resp 16   Ht 5\' 7"  (1.702 m)   Wt 52.6 kg   SpO2 94%   BMI 18.17 kg/m   Physical Exam Constitutional:      General: She is not in acute distress.    Appearance: She is well-developed. She is not diaphoretic.  HENT:     Head: Normocephalic and atraumatic.     Right Ear: External ear normal.     Left Ear: External ear normal.     Nose: Nose normal.  Eyes:     General: No scleral icterus.       Right eye: No discharge.        Left eye: No discharge.     Conjunctiva/sclera: Conjunctivae normal.     Pupils: Pupils are equal, round, and reactive to light.  Cardiovascular:     Rate and Rhythm: Normal rate and regular rhythm.     Pulses:          Radial pulses are 2+ on the right side and 2+ on the left side.       Dorsalis pedis pulses are 2+ on the right side and 2+ on the left side.     Heart sounds: Normal heart sounds. No murmur heard.    No friction rub. No gallop.  Pulmonary:     Effort: Pulmonary effort is normal. No respiratory distress.     Breath sounds: Normal breath sounds. No stridor. No wheezing.  Abdominal:     General: There is no distension.     Palpations: Abdomen is soft.     Tenderness: There is no abdominal tenderness.  Musculoskeletal:     Left shoulder: Tenderness present. No swelling or deformity. Normal range of motion. Normal strength. Normal pulse.  Left upper arm: Tenderness present.     Left elbow: No swelling or deformity. Normal range of motion. Tenderness present.     Left wrist: Tenderness present. No swelling or deformity.     Cervical back: Normal range of motion and neck supple. No bony tenderness.     Thoracic back: No bony tenderness.     Lumbar back: No bony tenderness.     Comments: Clavicles stable. Chest stable to AP/Lat compression. Pelvis stable to Lat compression. No obvious extremity deformity. No chest or abdominal wall  contusion.  Skin:    General: Skin is warm and dry.     Findings: No erythema or rash.       Neurological:     Mental Status: She is alert and oriented to person, place, and time.     Comments: Moving all extremities     ED Results and Treatments Labs (all labs ordered are listed, but only abnormal results are displayed) Labs Reviewed  CBC WITH DIFFERENTIAL/PLATELET - Abnormal; Notable for the following components:      Result Value   RBC 5.53 (*)    MCV 77.8 (*)    MCH 23.1 (*)    MCHC 29.8 (*)    RDW 16.9 (*)    Lymphs Abs 0.6 (*)    All other components within normal limits  BASIC METABOLIC PANEL WITH GFR - Abnormal; Notable for the following components:   Potassium 3.4 (*)    Glucose, Bld 112 (*)    All other components within normal limits                                                                                                                         EKG  EKG Interpretation Date/Time:    Ventricular Rate:    PR Interval:    QRS Duration:    QT Interval:    QTC Calculation:   R Axis:      Text Interpretation:         Radiology CT CHEST WO CONTRAST Result Date: 10/10/2023 CLINICAL DATA:  Blunt chest trauma.  Fall injury. EXAM: CT CHEST WITHOUT CONTRAST TECHNIQUE: Multidetector CT imaging of the chest was performed following the standard protocol without IV contrast. RADIATION DOSE REDUCTION: This exam was performed according to the departmental dose-optimization program which includes automated exposure control, adjustment of the mA and/or kV according to patient size and/or use of iterative reconstruction technique. COMPARISON:  CT scan chest, abdomen and pelvis 01/23/2023, with contrast. FINDINGS: Cardiovascular: Thickening and moderate calcification noted across the aortic valve leaflets. The aorta is tortuous and moderately calcified without aneurysm. Scattered calcification in the great vessels. There are left main and heavy three-vessel coronary artery  calcifications, mild cardiomegaly and no pericardial effusion. The superior pulmonary veins are slightly prominent but no more than previously. Pulmonary arteries are normal in caliber. Mediastinum/Nodes: Calcified right hilar and subcarinal lymph nodes. Without contrast no noncalcified adenopathy is seen. Thyroid  gland, thoracic trachea and main  bronchi are unremarkable. There is no mediastinal hemorrhage, hematoma or pneumomediastinum. There is a large hiatal hernia with partially intrathoracic stomach. Thoracic esophagus unremarkable, as visualized. Lungs/Pleura: Bilateral trace pleural effusions are new. There are coarse atelectatic markings in the lower lobe bases, mild linear scarring anteromedial and apical right upper lobe, medial left apex. There is no consolidation or pneumothorax.  No nodules are seen. Upper Abdomen: Abdominal aortic atherosclerosis. Old granulomatous calcifications liver and spleen. No acute upper abdominal findings. Indeterminate hypodense lesion in segment 2 of the liver is not as well seen without contrast today, but it may have enlarged. Today this is 3.4 x 3.0 cm on 2:134, was previously 2.8 x 2.2 cm. Slow growing neoplasm is not excluded. Follow-up as indicated taking into account advanced age. Musculoskeletal: Diffuse osteopenia. There is a new but subacute partially healed fracture deformity of the manubrium sternum. There are multiple bilateral healed ribcage fracture deformities but I do not see a definitive displaced acute fracture. There is moderate thoracic kyphosis and multilevel compression fractures. Vertebral augmentation cement is noted chronically at T4, newly noted at T5, chronically noted at T8, 9 and 10. There is a chronic severe compression fracture with mild retropulsion at T3, new age indeterminate but probably subacute moderate to severe compression fracture of T2 with slight posterior cortical bowing, severe chronic T7 compression fracture with dorsal cortical  bowing, and severe chronic treated compression fractures of T8, 9 and 10 with less pronounced chronic treated compression fractures of T12 and L1. There is relatively mild vertebral height loss at the levels of treated fractures of T4 and 5. The only vertebrae which are normal in height R T6 and T11. There are diffuse degenerative changes. Multilevel acquired thoracic foraminal stenosis is seen at essentially all levels. IMPRESSION: 1. No acute trauma related findings in the chest. 2. New trace pleural effusions with bibasilar atelectasis. 3. Cardiomegaly with left main and heavy three-vessel coronary artery calcifications. 4. Thickening and moderate calcification across the aortic valve leaflets. Echocardiography recommended if clinically warranted given age. 5. Large hiatal hernia with partially intrathoracic stomach. 6. Indeterminate hypodense lesion in segment 2 of the liver, not as well seen without contrast today, but it may have enlarged. Slow growing neoplasm is not excluded. Follow-up as indicated taking into account advanced age. 7. Multiple thoracic and lumbar compression fractures, some treated, some chronic, with a new moderate to severe T2 compression fracture which is probably subacute given its sclerotic appearance. 8. Osteopenia, kyphosis and degenerative change. 9. Aortic atherosclerosis. Aortic Atherosclerosis (ICD10-I70.0). Electronically Signed   By: Denman Fischer M.D.   On: 10/10/2023 07:25   DG ELBOW COMPLETE LEFT (3+VIEW) Result Date: 10/10/2023 CLINICAL DATA:  Fall.  Elbow pain. EXAM: LEFT ELBOW - COMPLETE 3+ VIEW COMPARISON:  None Available. FINDINGS: Bones are demineralized. No evidence for an acute fracture. No subluxation or dislocation. No fat pad elevation to suggest joint effusion. IMPRESSION: Negative. Electronically Signed   By: Donnal Fusi M.D.   On: 10/10/2023 05:25   DG Wrist Complete Left Result Date: 10/10/2023 CLINICAL DATA:  Fall from bed.  Pain. EXAM: LEFT WRIST -  COMPLETE 3+ VIEW COMPARISON:  None Available. FINDINGS: No evidence for an acute fracture or dislocation. Degenerative changes are seen in the radial carpus and first carpometacarpal joint. Bones are markedly demineralized. IMPRESSION: Degenerative changes without acute bony findings. Electronically Signed   By: Donnal Fusi M.D.   On: 10/10/2023 05:24   DG Thoracic Spine 2 View Result Date: 10/10/2023 CLINICAL DATA:  Fall.  Pain. EXAM: THORACIC SPINE 2 VIEWS COMPARISON:  CT thoracic spine 03/09/2023 FINDINGS: Bones are markedly demineralized. Using the same numbering scheme as the recent CT, patient is status post augmentation at T4, T5 T8, T9, T10, and T12. Similar compression deformity at T2, T3 and T7. Vertebral body height at T6 in T11 remains relatively preserved. No new compression deformity evident in the thoracic spine. IMPRESSION: Marked osteopenia with multiple compression deformities in the thoracic spine. No new compression deformity evident. Electronically Signed   By: Donnal Fusi M.D.   On: 10/10/2023 05:23   DG Ribs Unilateral W/Chest Left Result Date: 10/10/2023 CLINICAL DATA:  Fall with left rib pain. EXAM: LEFT RIBS AND CHEST - 3+ VIEW COMPARISON:  Chest CT 01/23/2023.  Chest x-ray 03/09/2023 FINDINGS: No focal consolidation. No pneumothorax or substantial pleural effusion. The cardio pericardial silhouette is enlarged. Bones are diffusely demineralized. Chronic posttraumatic deformity noted right humeral head. Multiple nonacute left rib fractures evident. No definite acute displaced left-sided rib fracture. IMPRESSION: 1. Multiple nonacute left rib fractures. No definite acute displaced left-sided rib fracture. 2. No pneumothorax. Electronically Signed   By: Donnal Fusi M.D.   On: 10/10/2023 05:18   DG Shoulder Left Result Date: 10/10/2023 CLINICAL DATA:  Patient fell from bed. Severe left shoulder arm elbow and rib pain. EXAM: LEFT SHOULDER - 2+ VIEW COMPARISON:  05/02/2021  FINDINGS: No evidence for an acute fracture. No shoulder separation or dislocation. Loss of acromial humeral space suggest chronic rotator cuff pathology. Multiple age indeterminate left rib fractures evident. IMPRESSION: 1. No acute bony findings. 2. Loss of acromial humeral space suggests chronic rotator cuff pathology. 3. Multiple age indeterminate left rib fractures. Electronically Signed   By: Donnal Fusi M.D.   On: 10/10/2023 05:15    Medications Ordered in ED Medications  HYDROmorphone  (DILAUDID ) injection 0.5 mg (0.5 mg Intravenous Given 10/10/23 0459)   Procedures Procedures  (including critical care time) Medical Decision Making / ED Course   Medical Decision Making Amount and/or Complexity of Data Reviewed Labs: ordered. Radiology: ordered.  Risk Prescription drug management.    Mechanical fall resulting in left chest and upper extremity pain.  Workup grossly reassuring without new traumatic injuries.  Patient did have remote vertebral compression fractures and rib fractures without any acute changes.  Single dose of 0.5 mg of Dilaudid  improved the pain.  Skin tears and then require any primary closure.  Cleaned and bandaged.     Final Clinical Impression(s) / ED Diagnoses Final diagnoses:  Fall at nursing home, initial encounter   The patient appears reasonably screened and/or stabilized for discharge and I doubt any other medical condition or other [facility] requiring further screening, evaluation, or treatment in the ED at this time. I have discussed the findings, Dx and Tx plan with the patient/family who expressed understanding and agree(s) with the plan. Discharge instructions discussed at length. The patient/family was given strict return precautions who verbalized understanding of the instructions. No further questions at time of discharge.  Disposition: Discharge  Condition: Good  ED Discharge Orders     None        Follow Up: Rehab, [facility]  And [address] [place] [address] [phone]  Go to  to schedule an appointment for close follow up    This chart was dictated using voice recognition software.  Despite best efforts to proofread,  errors can occur which can change the documentation meaning.    Lindle Rhea, MD 10/10/23  0745  

## 2023-10-10 NOTE — ED Triage Notes (Signed)
 Patient brought in by EMS from [facility]. Patient was noted trying to get in bed and when she went to sit down she missed the bed. Patient is c/o left shoulder, arm. Elbow and rib pain. Skin tears noted to left elbow and left shoulder. Bleeding controlled. Patient alert and oriented x4.

## 2023-10-10 NOTE — ED Notes (Signed)
 Report given to RN at [facility]. Facility ready to receive pt

## 2023-10-10 NOTE — ED Notes (Signed)
 Skin tears to pt's left elbow and left shoulder cleaned and dressed per MD orders.

## 2023-10-10 NOTE — ED Notes (Signed)
 [facility] called they will be here in 30 min

## 2023-11-16 IMAGING — DX DG ELBOW COMPLETE 3+V*L*
4 series · 4 of 4 positions shown · non-contrast
Comparison: None.

CLINICAL DATA: Elbow pain after fall.

EXAM:
LEFT ELBOW - COMPLETE 3+ VIEW

[elbow ap]
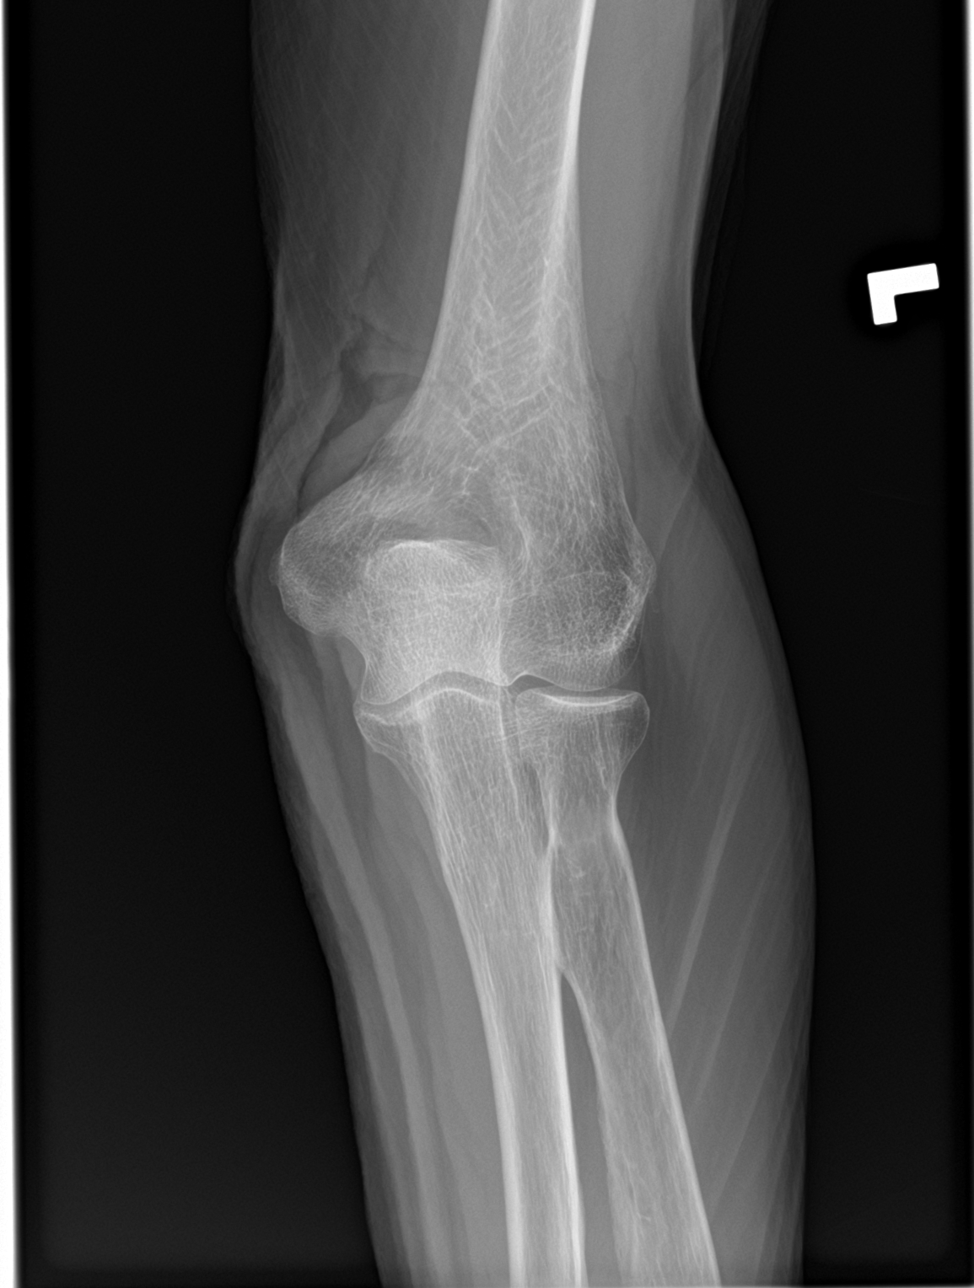

[elbow obl (1 of 2)]
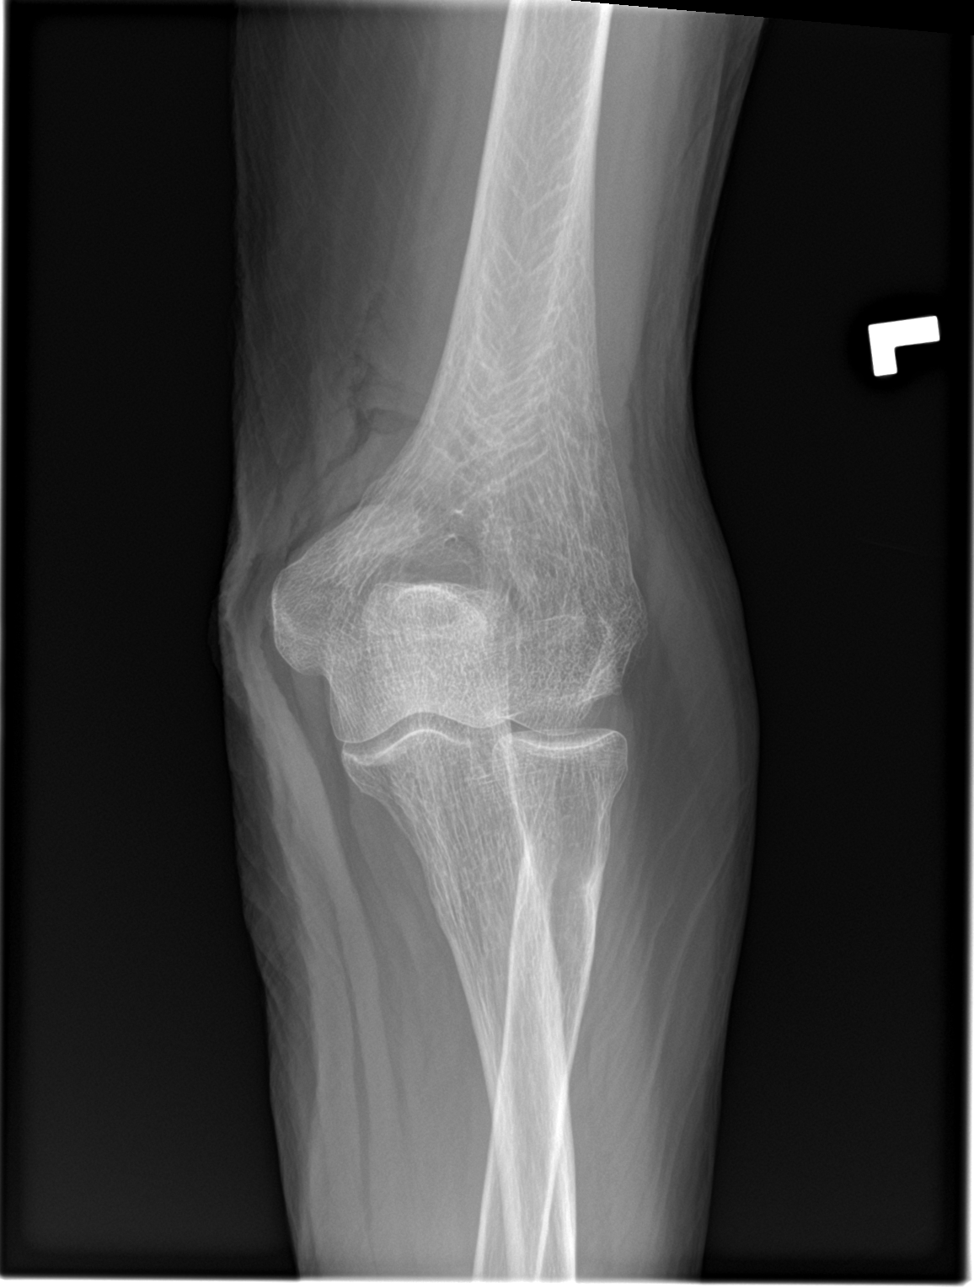

[elbow obl (2 of 2)]
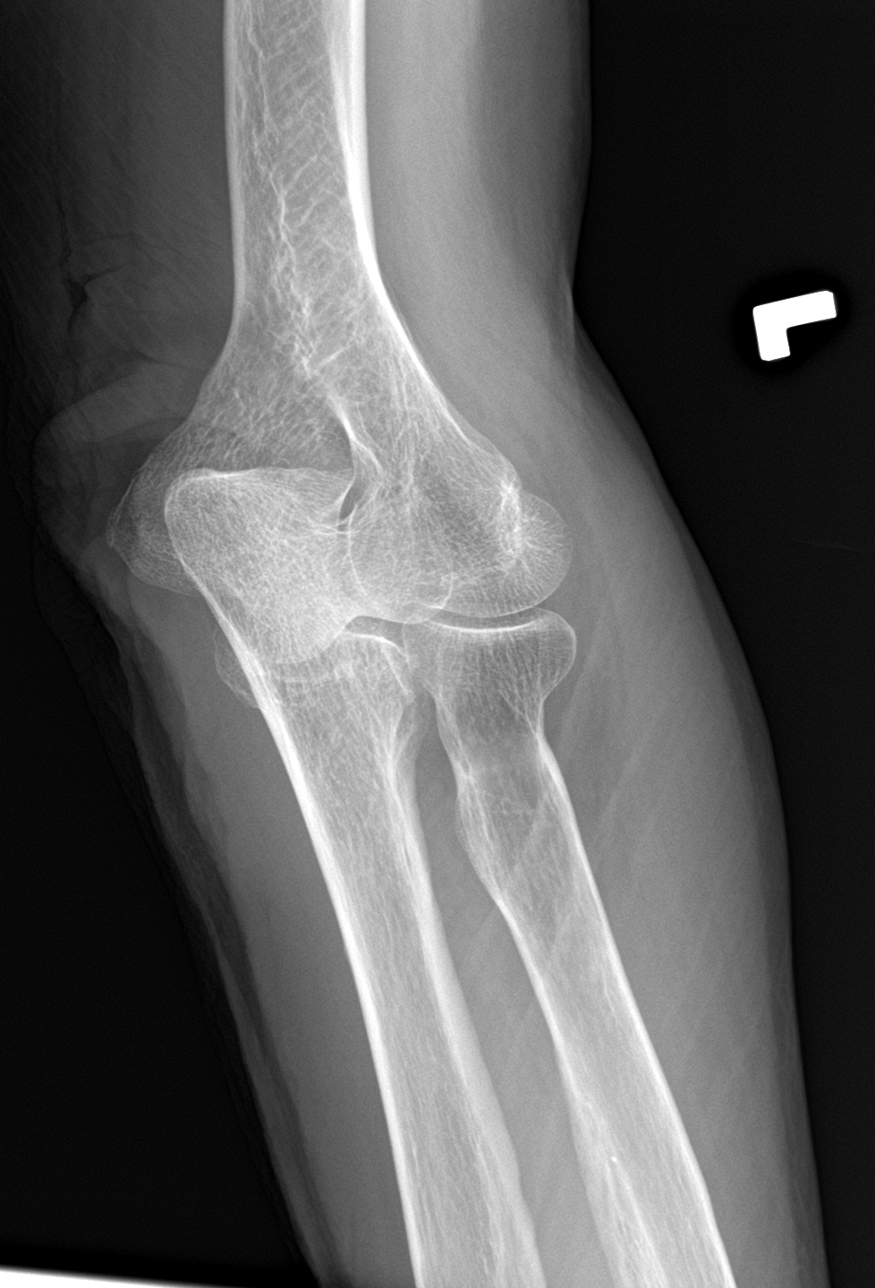

[elbow lat]
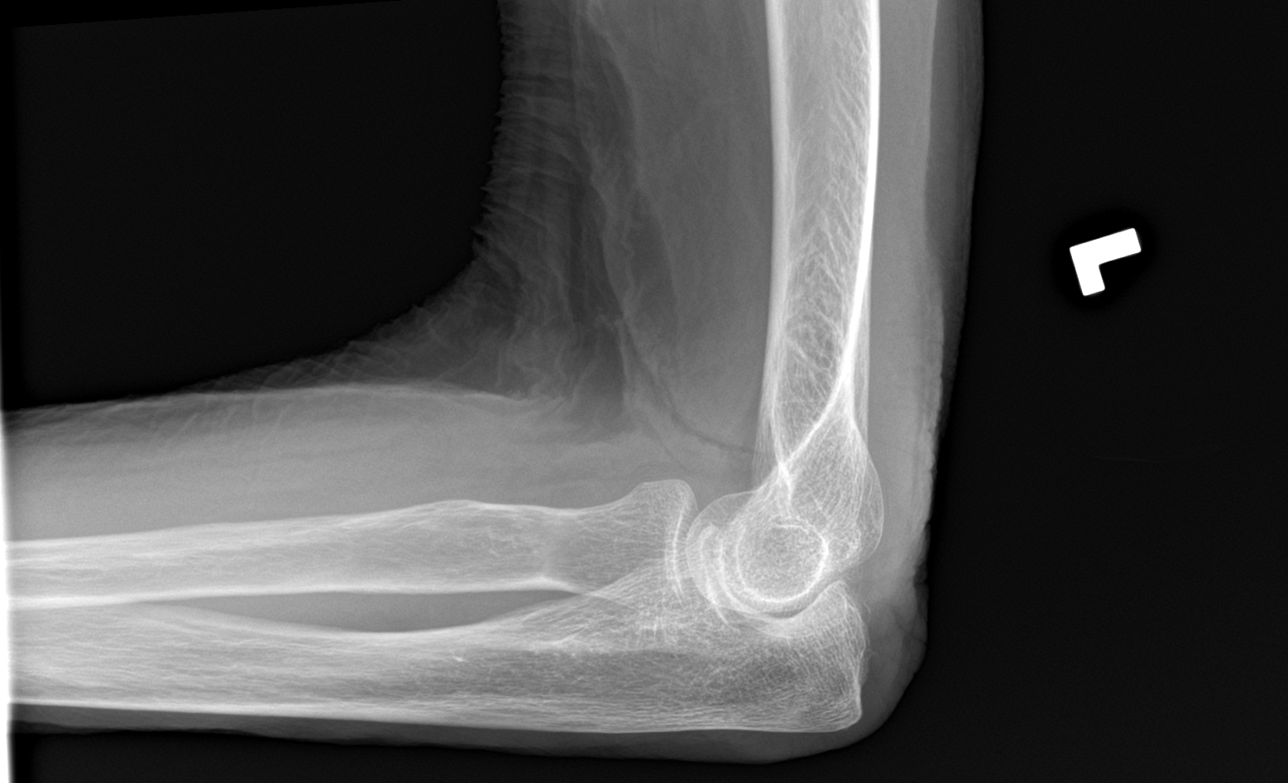

[4 of 4 positions shown; findings below may reference images not displayed]

FINDINGS: There is no evidence of fracture, dislocation, or joint effusion.
There is no evidence of arthropathy or other focal bone abnormality.
Soft tissues are unremarkable.
IMPRESSION: Negative.

## 2023-11-16 IMAGING — DX DG SHOULDER 2+V*L*
3 series · 3 of 3 positions shown · non-contrast
Comparison: None.

CLINICAL DATA: Fall, shoulder pain

EXAM:
LEFT SHOULDER - 2+ VIEW

[shoulder grashey]
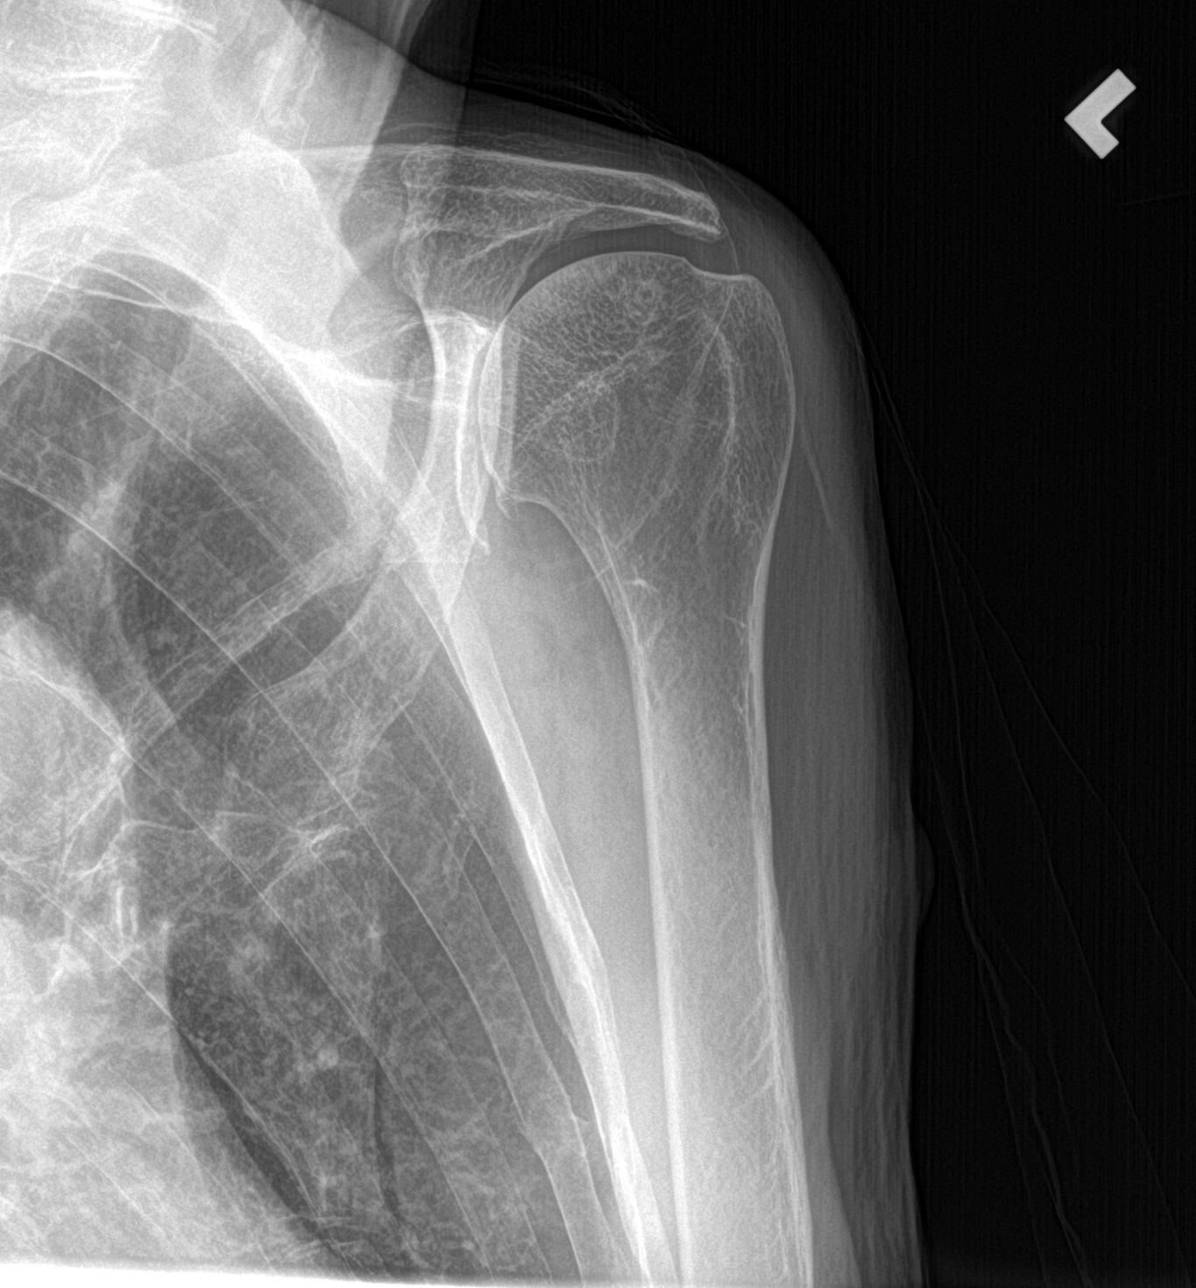

[shoulder y view]
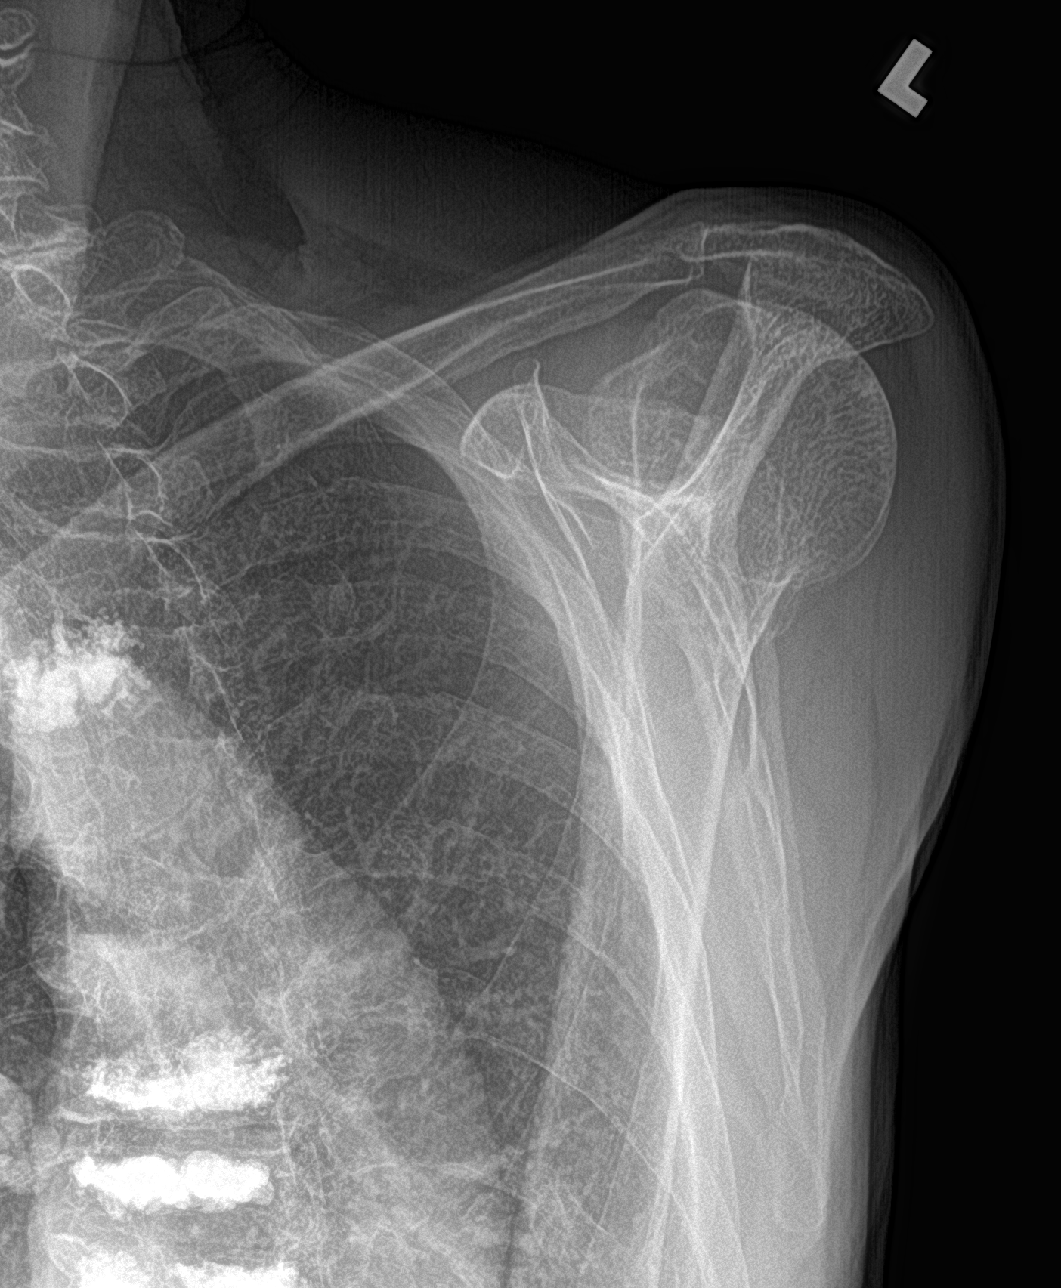

[shoulder ap neutral]
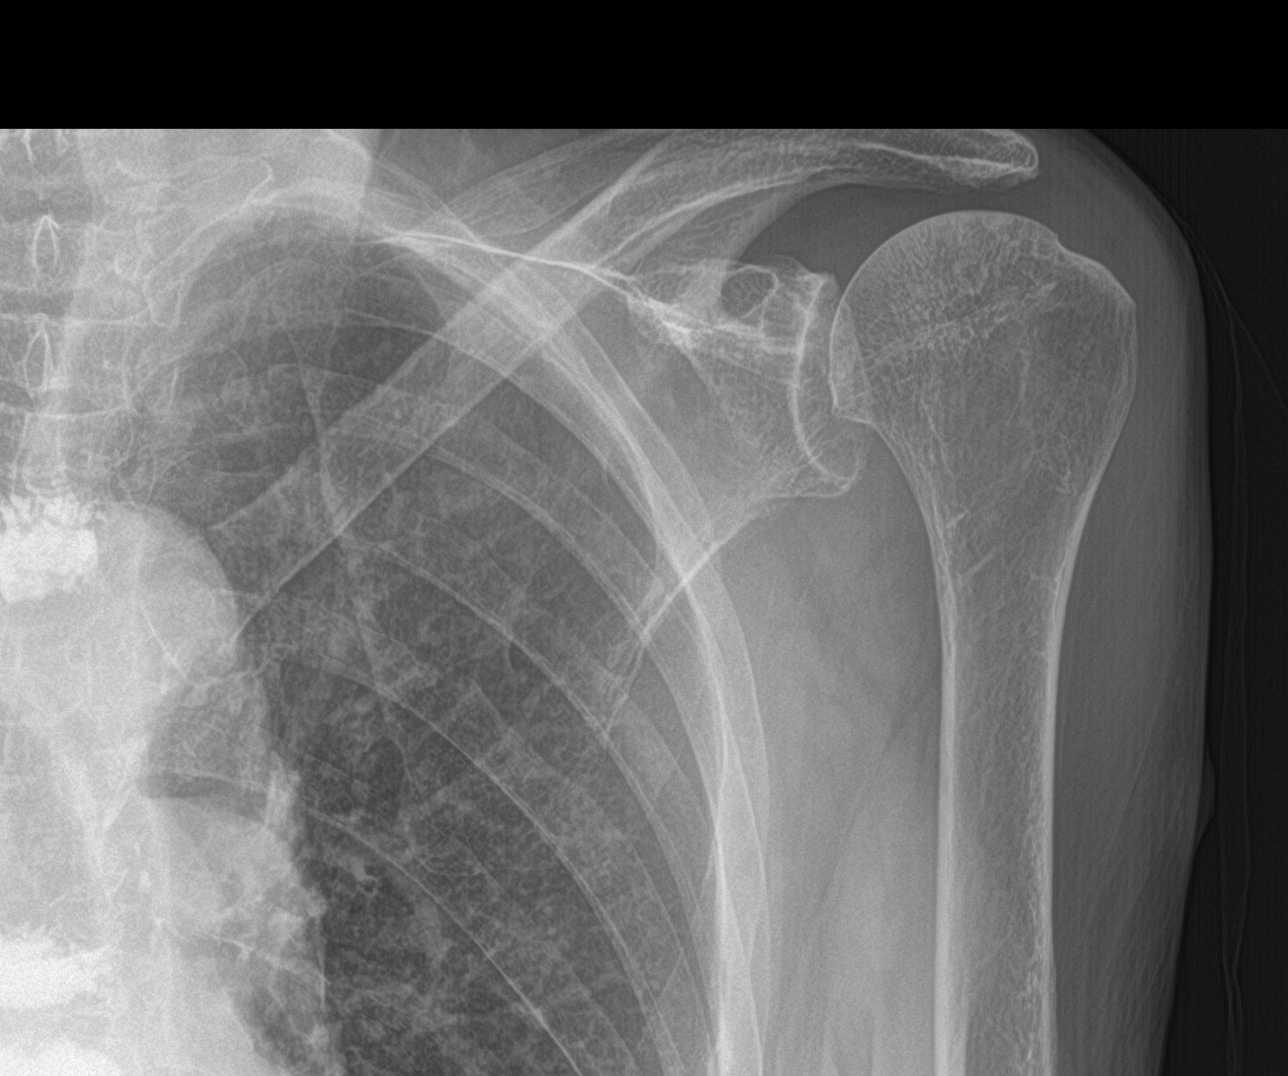

[3 of 3 positions shown; findings below may reference images not displayed]

FINDINGS: No acute fracture or dislocation identified. Moderate narrowing of
the glenohumeral joint with inferior marginal osteophytes. Bones are
osteopenic. A few old left-sided rib fractures are noted.
IMPRESSION: No acute osseous abnormality identified.

## 2023-11-16 IMAGING — DX DG HAND COMPLETE 3+V*L*
3 series · 3 of 3 positions shown · non-contrast
Comparison: None.

CLINICAL DATA: Hand pain after a fall.

EXAM:
LEFT HAND - COMPLETE 3+ VIEW

[hand pa]
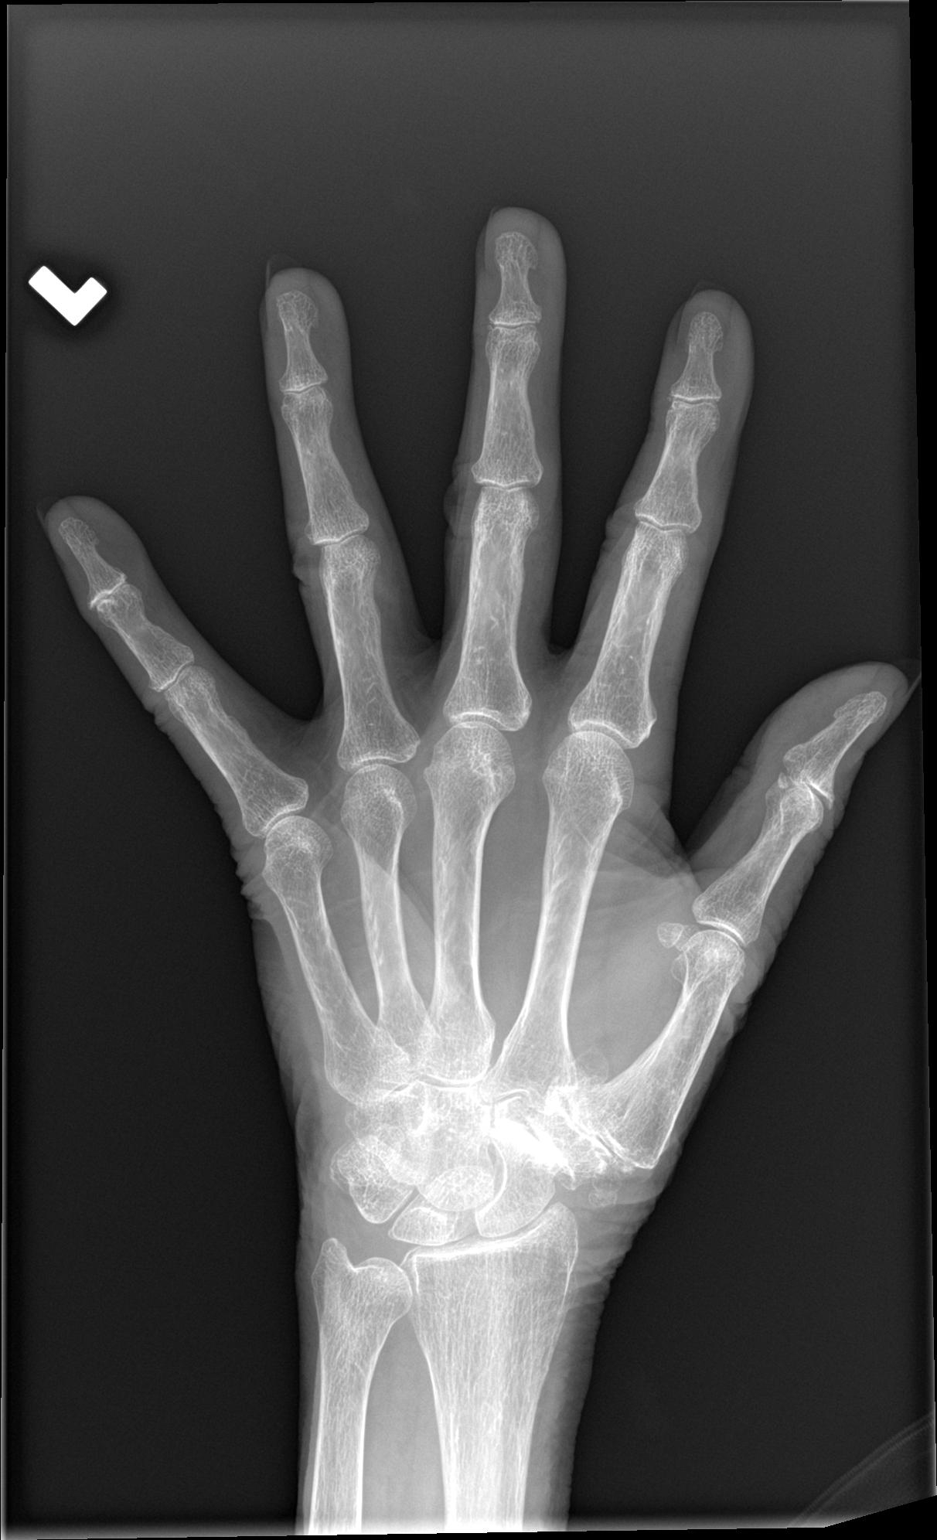

[hand obl]
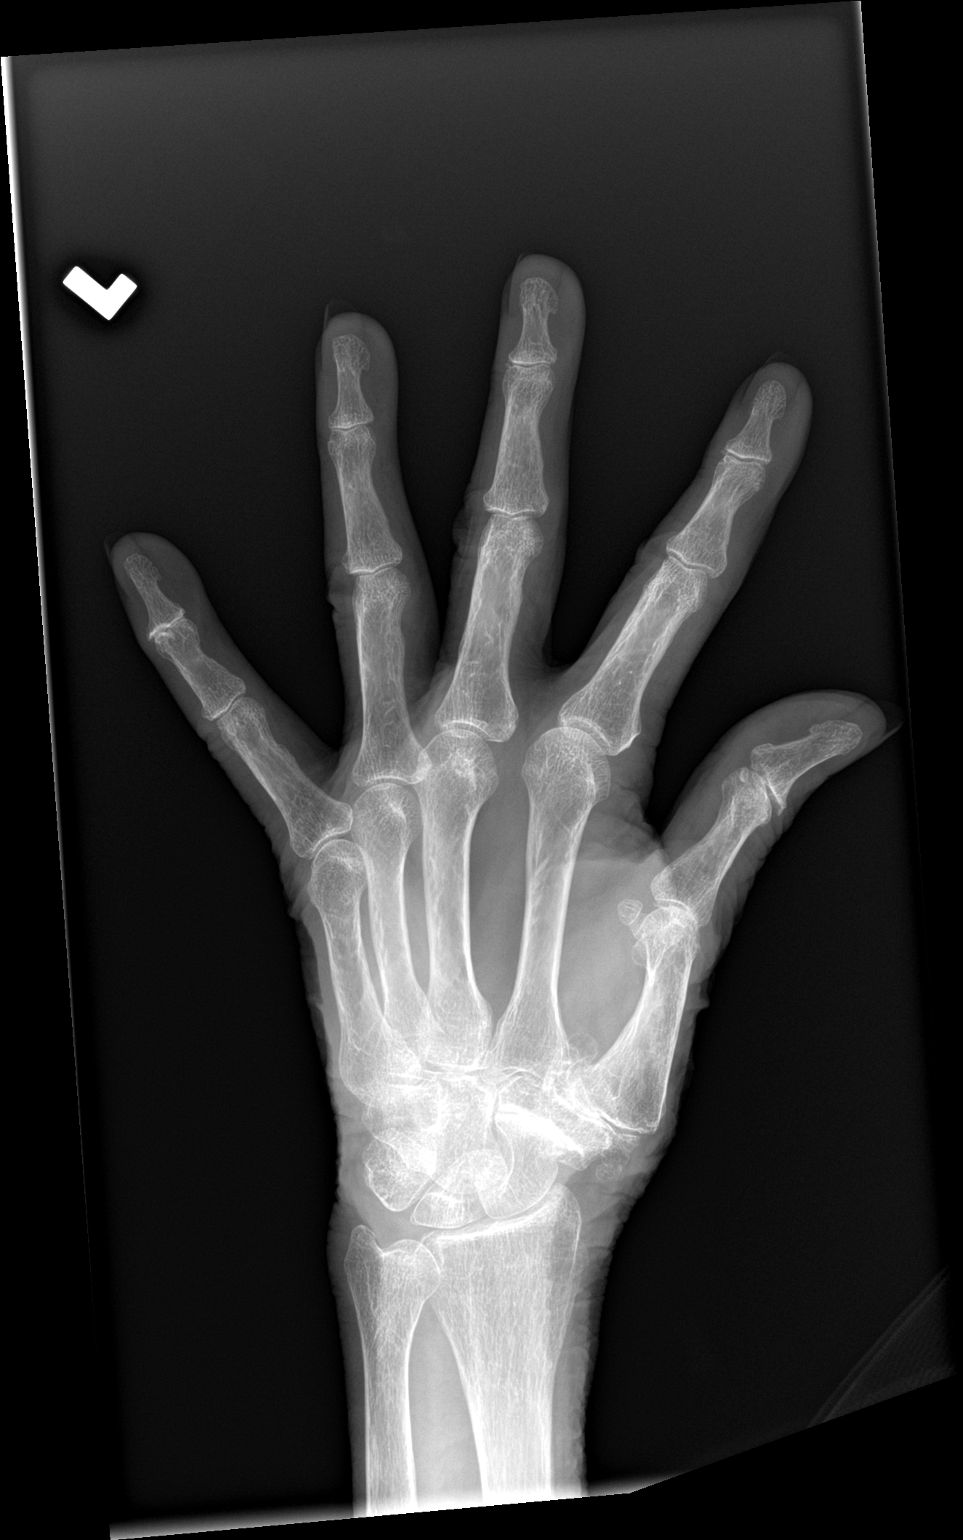

[hand lat]
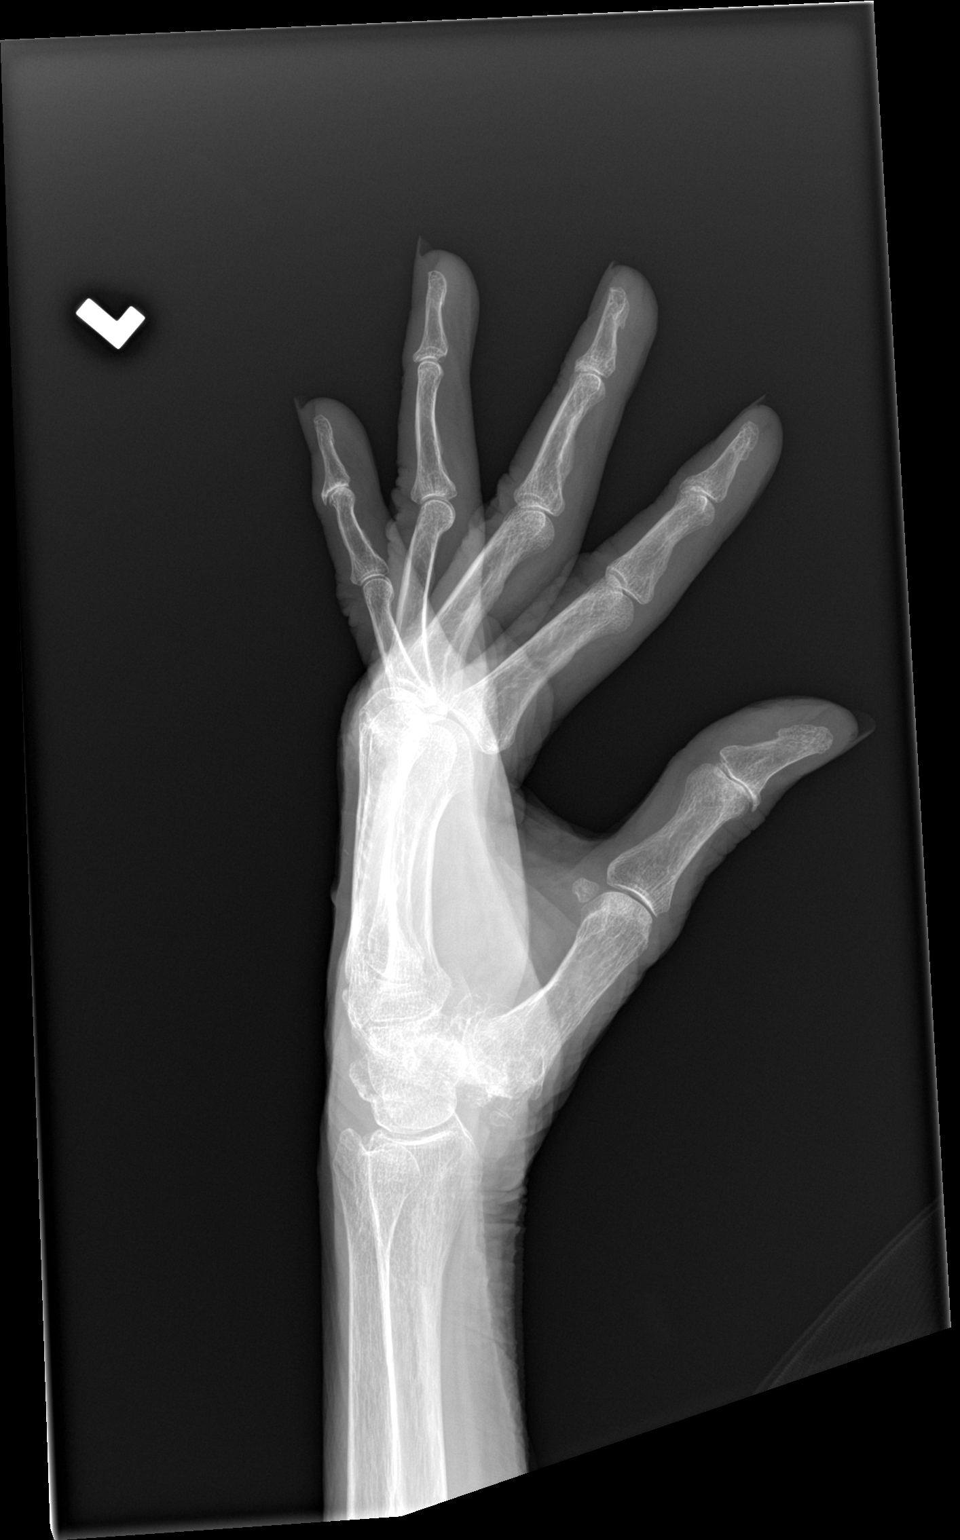

[3 of 3 positions shown; findings below may reference images not displayed]

FINDINGS: No acute fracture or dislocation. Severe scaphotrapeziotrapezoid and
first CMC joint osteoarthritis. Mild thumb IP and fifth DIP joint
osteoarthritis. Osteopenia. Soft tissues are unremarkable.
IMPRESSION: 1. No acute osseous abnormality.

## 2023-11-16 IMAGING — DX DG PELVIS 1-2V
2 series · 2 of 2 positions shown · non-contrast
Comparison: CT abdomen pelvis dated December 25, 2020.

CLINICAL DATA: Fall.

EXAM:
PELVIS - 1-2 VIEW

[pelvis ap (1 of 2)]
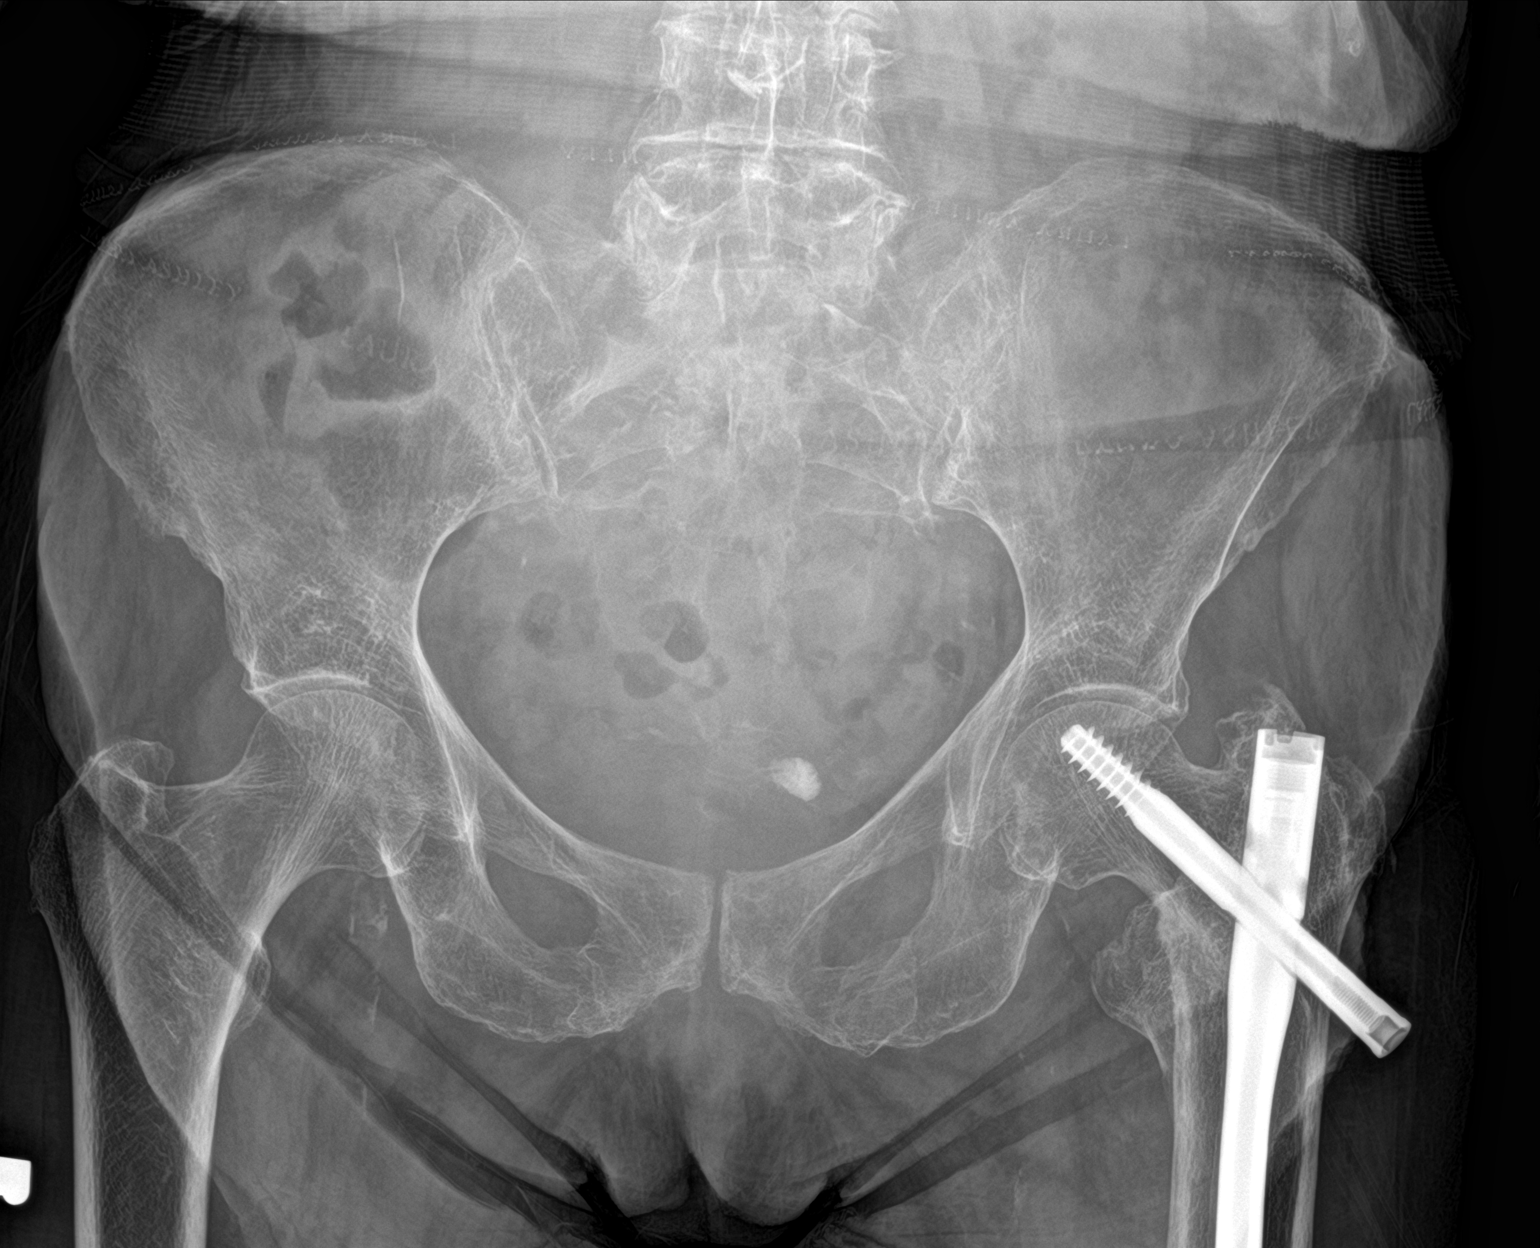

[pelvis ap (2 of 2)]
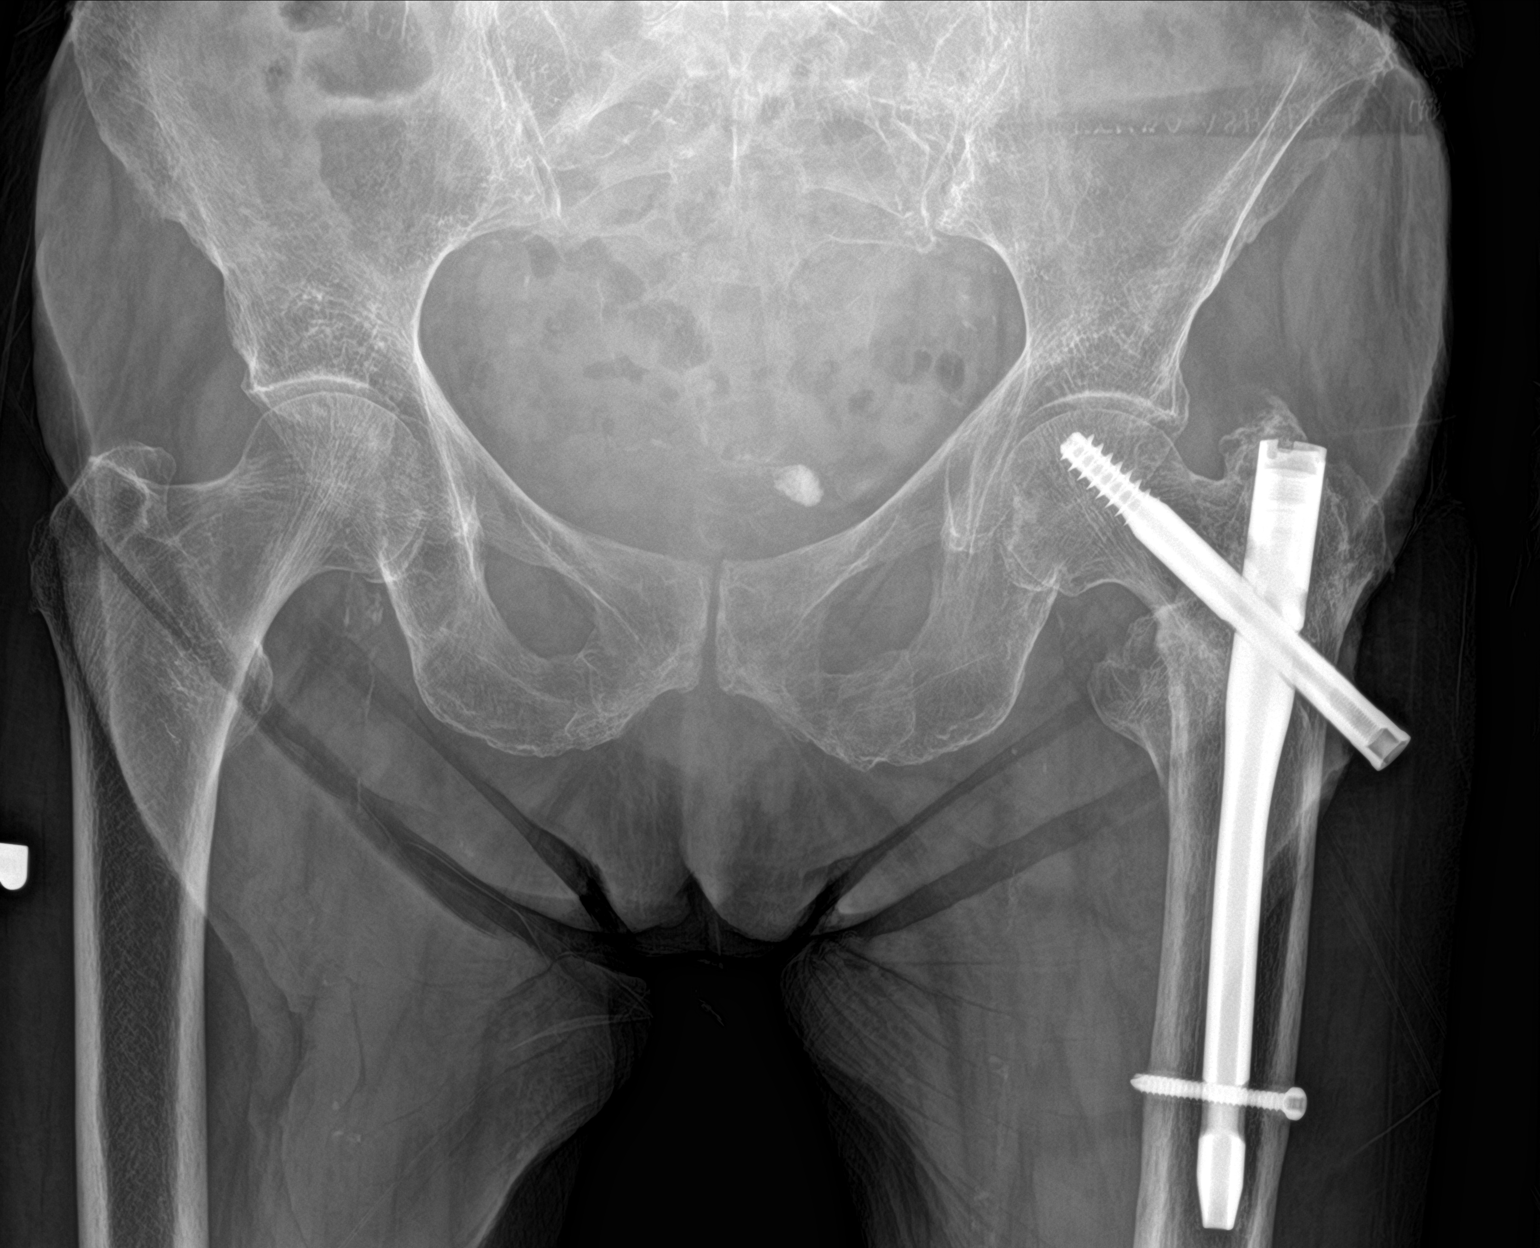

[2 of 2 positions shown; findings below may reference images not displayed]

FINDINGS: No acute fracture or dislocation. Old healed left intertrochanteric
femur fracture status post ORIF. Old healed bilateral inferior pubic
rami fractures. The hip joint spaces are relatively preserved.
Osteopenia. Unchanged small calcified fibroid in the pelvis.
IMPRESSION: 1. No acute osseous abnormality.

## 2023-12-09 ENCOUNTER — Other Ambulatory Visit: Payer: Self-pay

## 2023-12-09 DIAGNOSIS — I872 Venous insufficiency (chronic) (peripheral): Secondary | ICD-10-CM

## 2023-12-20 ENCOUNTER — Encounter

## 2023-12-20 ENCOUNTER — Encounter (HOSPITAL_COMMUNITY)

## 2024-01-14 ENCOUNTER — Ambulatory Visit: Attending: Vascular Surgery

## 2024-01-14 ENCOUNTER — Ambulatory Visit (HOSPITAL_COMMUNITY): Admission: RE | Admit: 2024-01-14 | Source: Ambulatory Visit

## 2024-04-16 IMAGING — CT CT ANGIO CHEST-ABD-PELV FOR DISSECTION W/ AND WO/W CM
2 of 7 series · 14 of 46 positions shown, 16 images · IV contrast (OMNI 350)
Comparison: CTA chest, CT Abdomen and Pelvis 12/25/2020.

CT thoracic and lumbar spine today are reported separately.

CLINICAL DATA: [AGE] female with chest and back pain.

EXAM:
CT ANGIOGRAPHY CHEST, ABDOMEN AND PELVIS
TECHNIQUE: Non-contrast CT of the chest was initially obtained.

[Series 7: dissection 2mm · axial · 0.63mm/px · z∈[+863,+1349]mm · 11 of 275 slices shown, 13 images]
[im 16/275  soft-tissue]
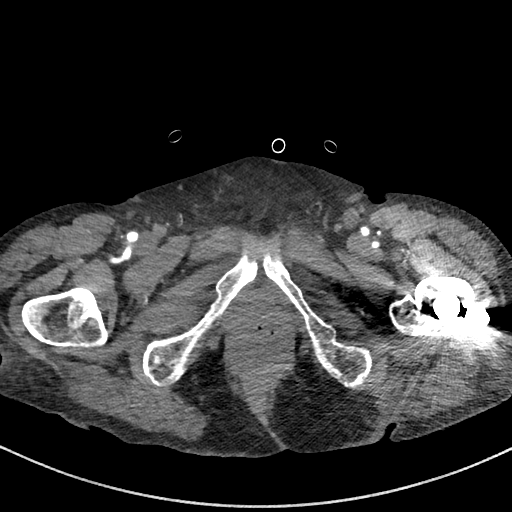
[im 16/275  bone]
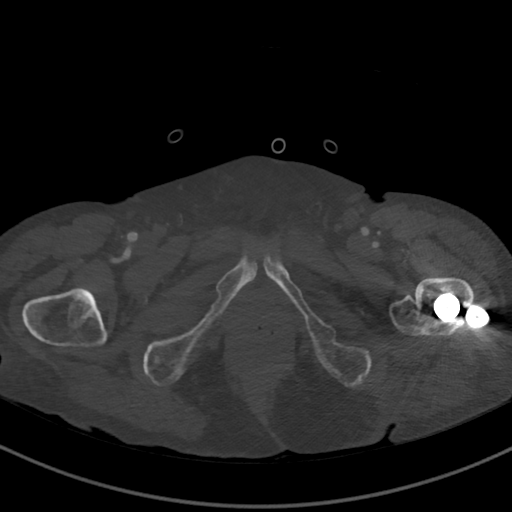
[im 46/275  soft-tissue]
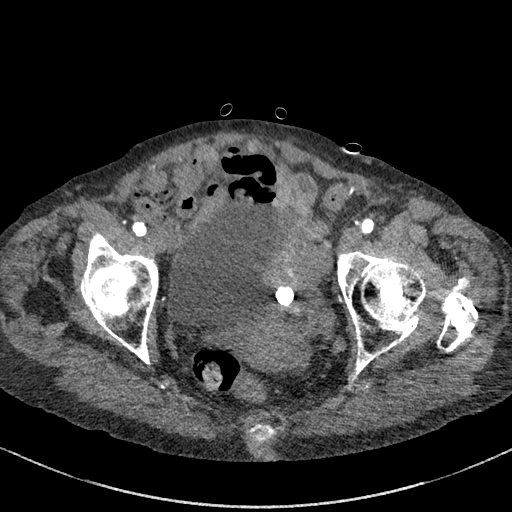
[im 61/275  soft-tissue]
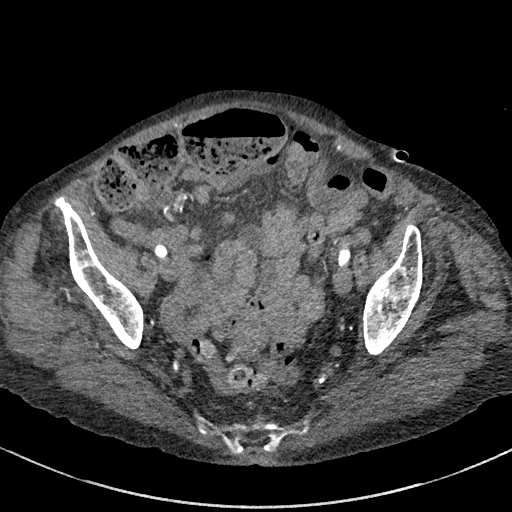
[im 92/275  soft-tissue]
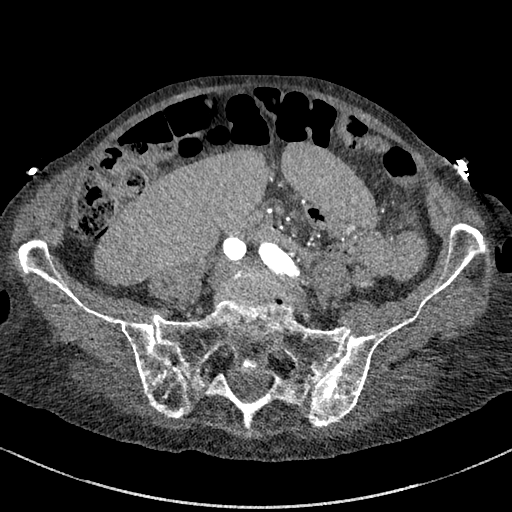
[im 107/275  soft-tissue]
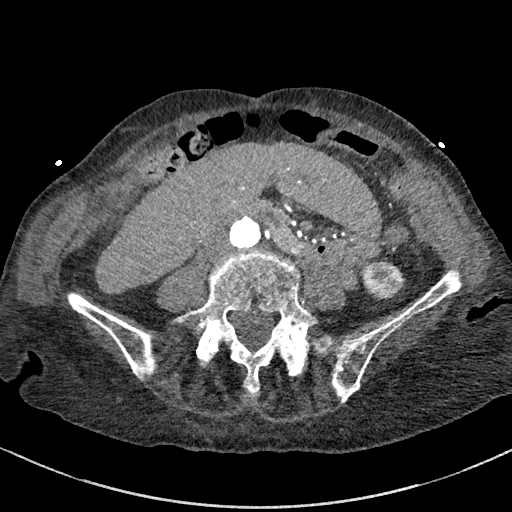
[im 138/275  soft-tissue]
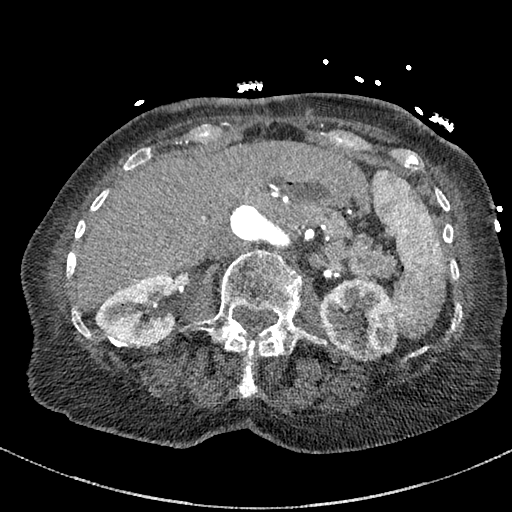
[im 168/275  soft-tissue]
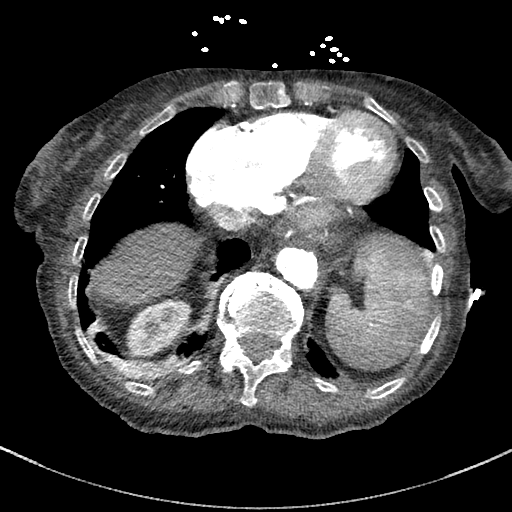
[im 183/275  soft-tissue]
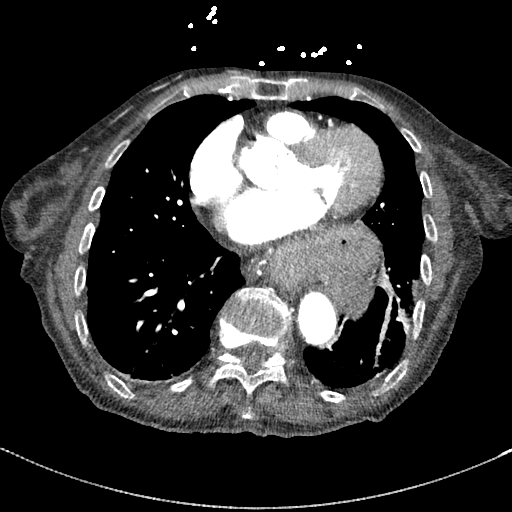
[im 214/275  soft-tissue]
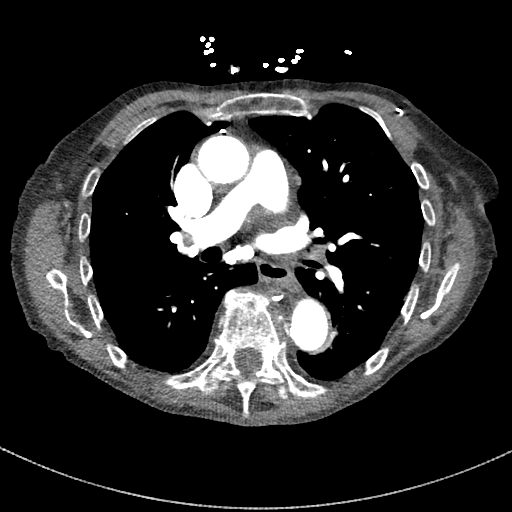
[im 214/275  bone]
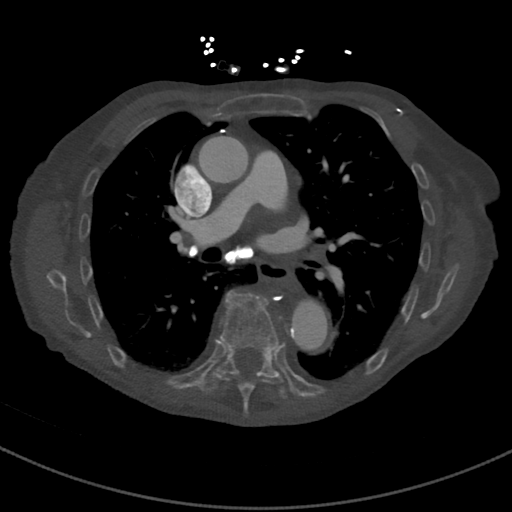
[im 229/275  soft-tissue]
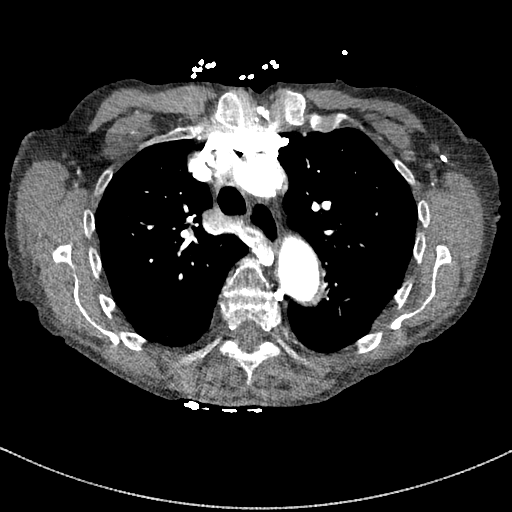
[im 259/275  soft-tissue]
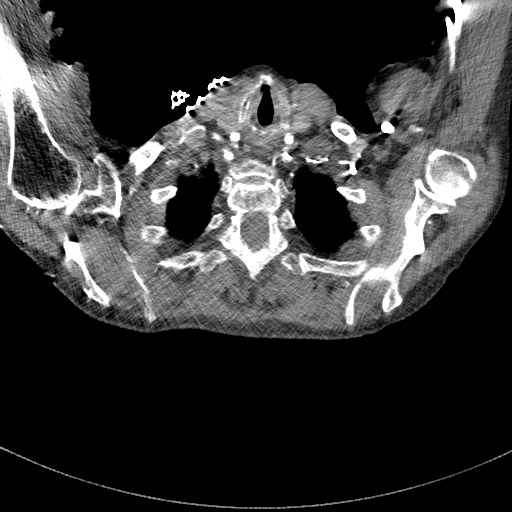

[Series 10: dissection 2mm cor · coronal · 0.78mm/px · 3 of 128 slices shown]
[im 32/128  soft-tissue]
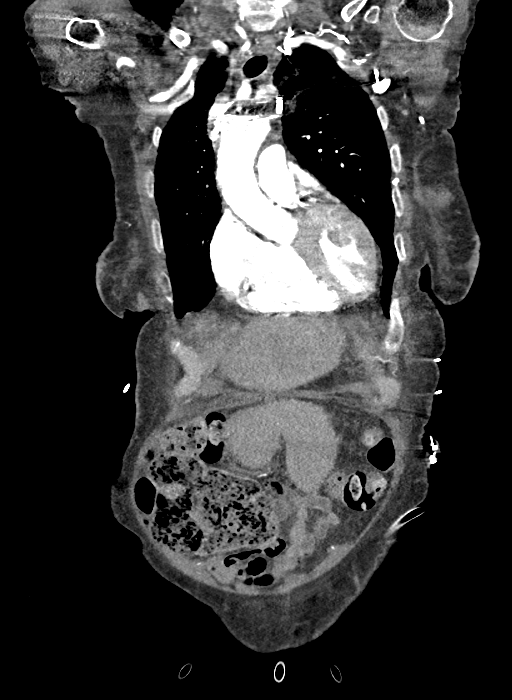
[im 64/128  soft-tissue]
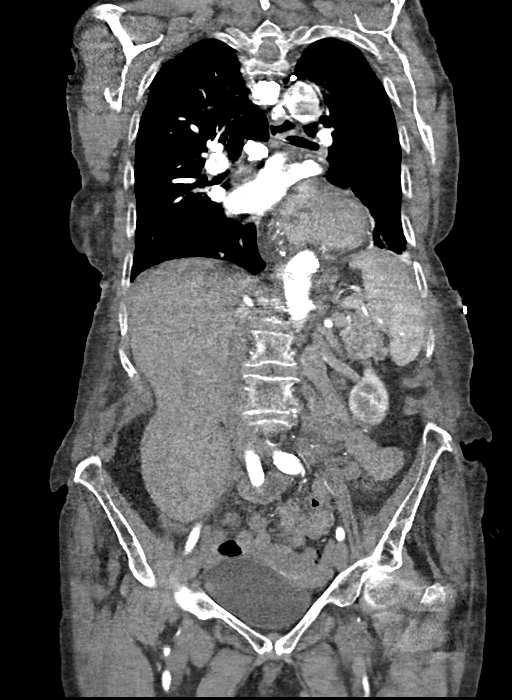
[im 96/128  soft-tissue]
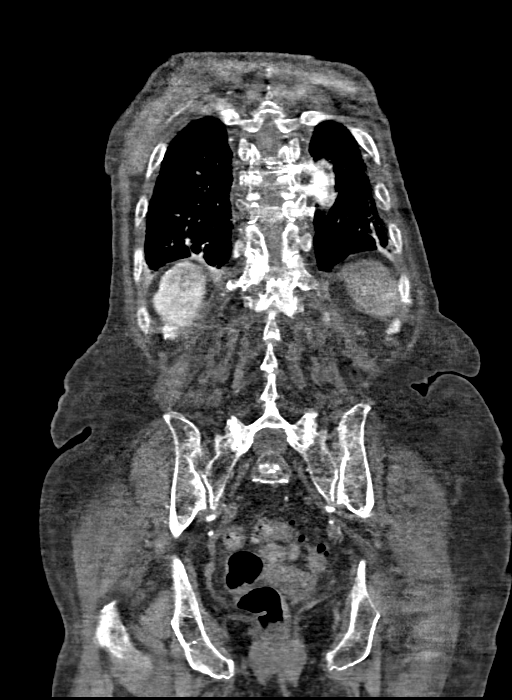

[14 of 46 positions shown; findings below may reference images not displayed]

Multidetector CT imaging through the chest, abdomen and pelvis was
performed using the standard protocol during bolus administration of
intravenous contrast. Multiplanar reconstructed images and MIPs were
obtained and reviewed to evaluate the vascular anatomy.

RADIATION DOSE REDUCTION: This exam was performed according to the
departmental dose-optimization program which includes automated
exposure control, adjustment of the mA and/or kV according to
patient size and/or use of iterative reconstruction technique.

CONTRAST:  100mL OMNIPAQUE IOHEXOL 350 MG/ML SOLN
FINDINGS: CTA CHEST FINDINGS

Cardiovascular: Stable cardiac size since last year, upper limits of
normal. Calcified aortic atherosclerosis. Calcified coronary artery
atherosclerosis. Tortuous thoracic aorta with no dissection or
aneurysm. Major pulmonary arteries today are patent. Resolved right
lower lobe posterior basal segment pulmonary embolus demonstrated
last year.

Mediastinum/Nodes: Stable. Post granulomatous calcified mediastinal
and right hilar lymph nodes. Large chronic gastric hiatal hernia.

Lungs/Pleura: Mildly larger, improved lung volumes compared to last
year. Major airways remain patent. Chronic left lower lobe
atelectasis in association with large hiatal hernia. Linear right
lower lobe atelectasis or scarring with improved right costophrenic
angle ventilation from last year. Trace lung base pleural fluid. No
areas of worsening ventilation.

Musculoskeletal: Thoracic spine is detailed separately today.
Diffuse osteopenia. Sternum appears stable from last year. Visible
shoulder osseous structures appear stable. Small C7 cervical ribs,
confirmed on prior cervical spine CT 02/19/2020. Absent ribs at T12.
Chronic posterior left 9th rib fracture is stable from last year,
but additional lateral left 9th and posterolateral left 8th rib
fractures are new although healing. No definite acute rib fracture.
Stable left breast surgical clips.

Review of the MIP images confirms the above findings.

CTA ABDOMEN AND PELVIS FINDINGS

VASCULAR

Aortoiliac calcified atherosclerosis. Chronically tortuous abdominal
aorta (series 11, image 56), negative for aortic dissection or
aneurysm. Celiac, SMA, and IMA remain patent. Renal arteries remain
patent. Iliac and visible proximal femoral arteries remain patent.

Review of the MIP images confirms the above findings.

NON-VASCULAR

Hepatobiliary: Chronic hepatomegaly, 20 cm liver and the right lobe
extends into the right hemipelvis as before. Chronically absent
gallbladder. Bile ducts appear stable.

Pancreas: Within normal limits.

Spleen: Within normal limits. Incidental small splenule (normal
variant).

Adrenals/Urinary Tract: Within normal limits.

Stomach/Bowel: No dilated large or small bowel. Mostly intrathoracic
stomach as before. Decompressed duodenum. Large bowel diverticulosis
most pronounced in the pelvis. Retained stool most pronounced in the
right colon. No discrete bowel inflammation identified. No free air
or free fluid.

Lymphatic: No lymphadenopathy identified.

Reproductive: Stable, small calcified uterine fibroid.

Other: No pelvic free fluid.

Musculoskeletal: Lumbar spine reported separately.

Diffuse osteopenia. Stable sacral and pelvic bone mineralization.
Chronic proximal left femur ORIF. Chronic bilateral pubic rami
fractures. No acute osseous abnormality identified.

Review of the MIP images confirms the above findings.
IMPRESSION: 1. Negative for aortic dissection or aneurysm. Aortic
Atherosclerosis ([id number]). Resolved right lower lobe pulmonary
embolus demonstrated last year.

2. No acute or inflammatory process identified in the chest,
abdomen, or pelvis. Thoracic and lumbar spine CT today reported
separately.

3. Large chronic gastric hiatal hernia. Chronic lung base
atelectasis, scarring. Chronic Hepatomegaly. Large bowel
diverticulosis.

## 2024-04-16 IMAGING — DX DG CHEST 1V PORT
1 series · 1 of 1 positions shown · non-contrast
Comparison: Chest radiograph dated 01/24/2021.

CLINICAL DATA: Chest pain and nausea.

EXAM:
PORTABLE CHEST 1 VIEW

[chest]
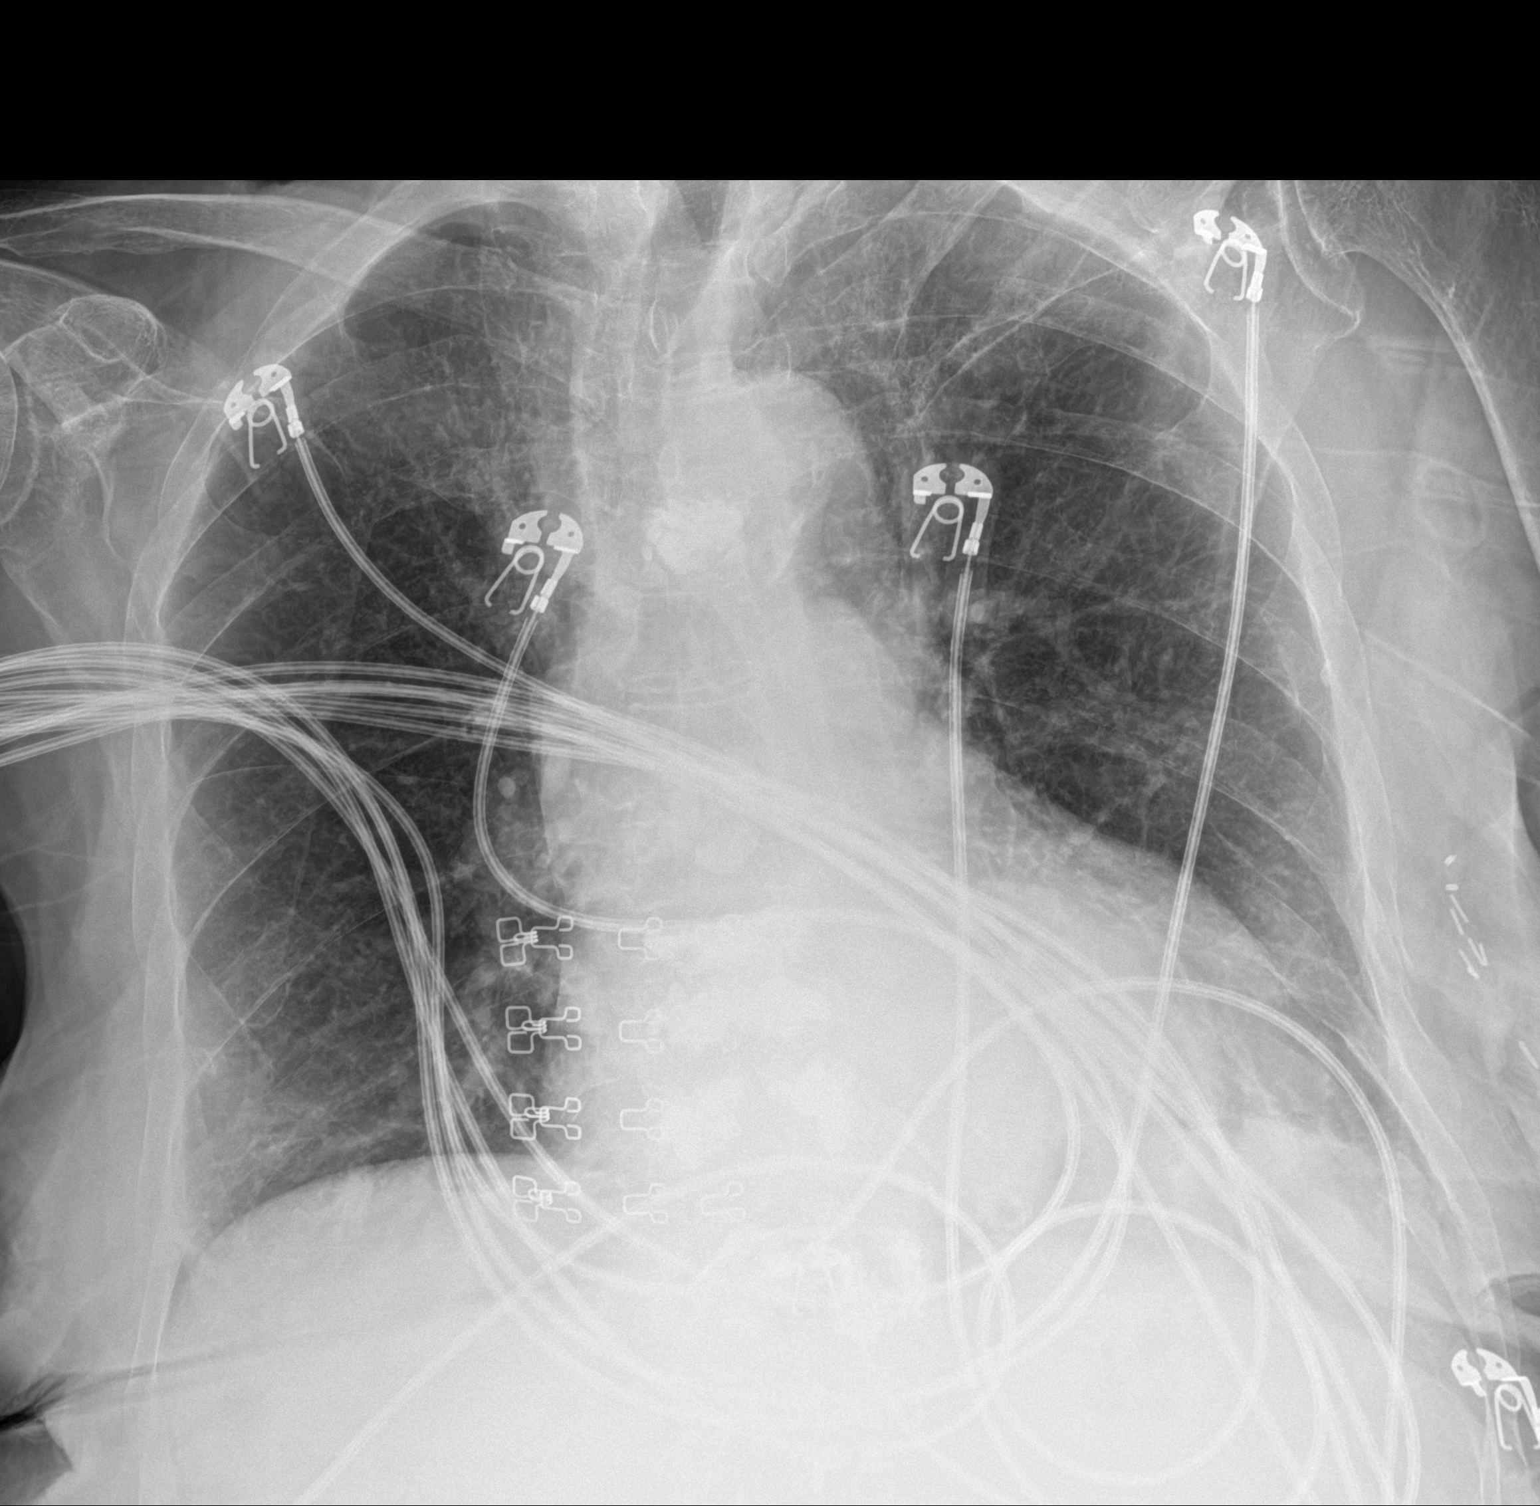

[1 of 1 positions shown; findings below may reference images not displayed]

FINDINGS: No focal consolidation, pleural effusion or pneumothorax. Stable
cardiomegaly. Atherosclerotic calcification of the aorta. Osteopenia
with multilevel thoracic vertebroplasty. No acute osseous pathology.
IMPRESSION: No acute cardiopulmonary process.

## 2024-04-20 IMAGING — CT CT HEAD W/O CM
4 series · 16 of 47 positions shown, 18 images · non-contrast
Comparison: 05/02/2021

CLINICAL DATA: Head trauma, minor (Age >= 65y).  Fall.



[Series 3: head without · axial · non-contrast · 0.42mm/px · z∈[-146,-16]mm · 7 of 36 slices shown, 9 images]
[im 5/36  brain]
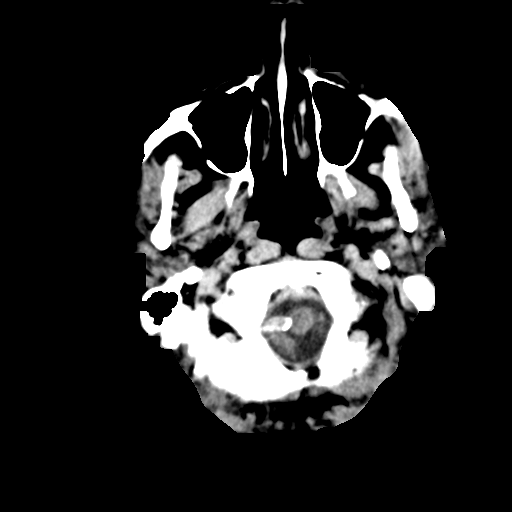
[im 5/36  bone]
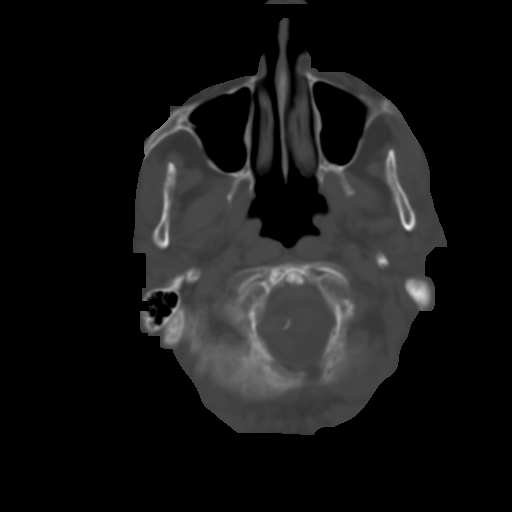
[im 9/36  brain]
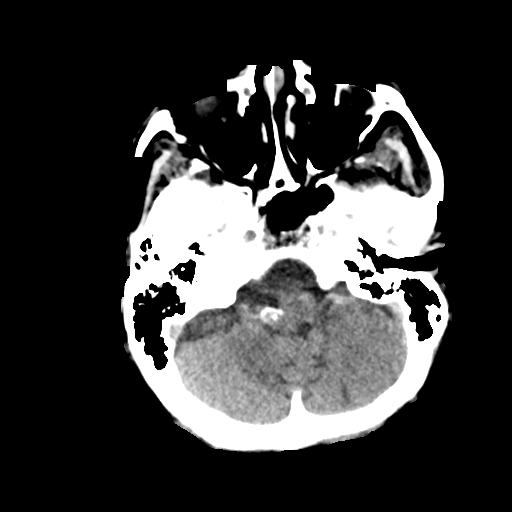
[im 14/36  brain]
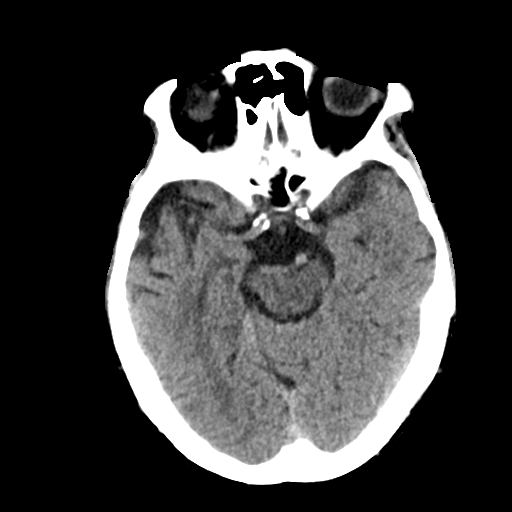
[im 18/36  brain]
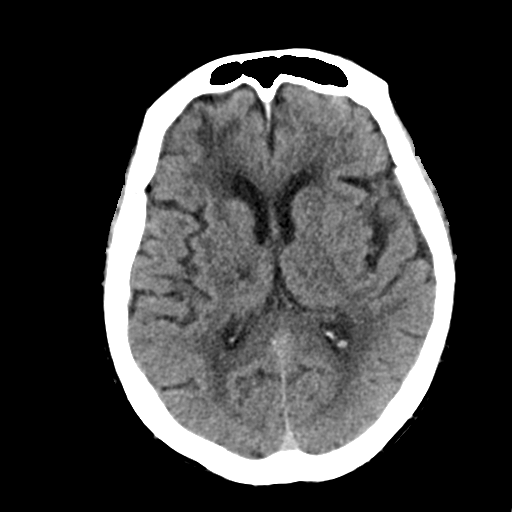
[im 22/36  brain]
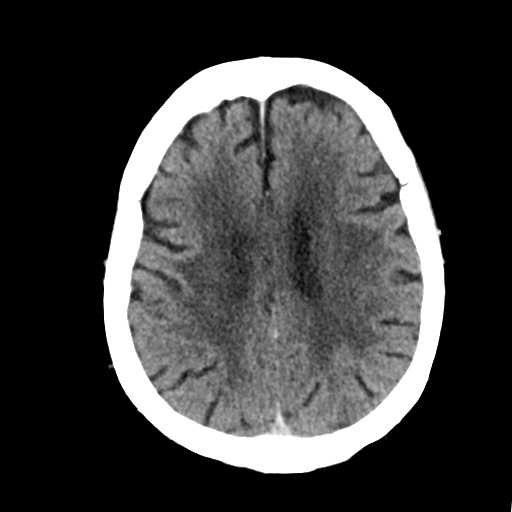
[im 22/36  bone]
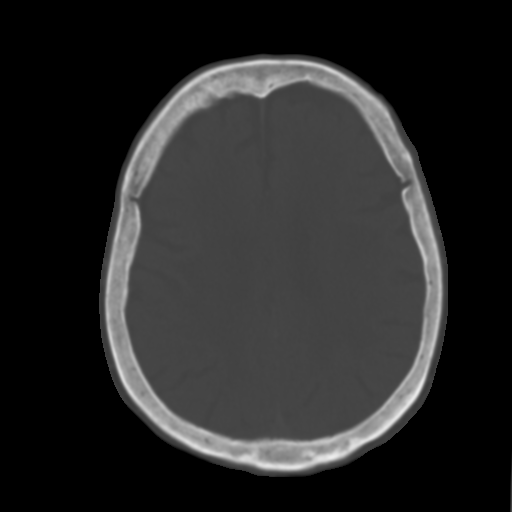
[im 27/36  brain]
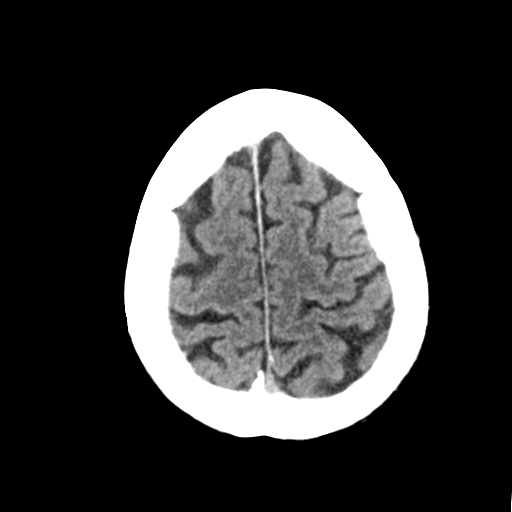
[im 31/36  brain]
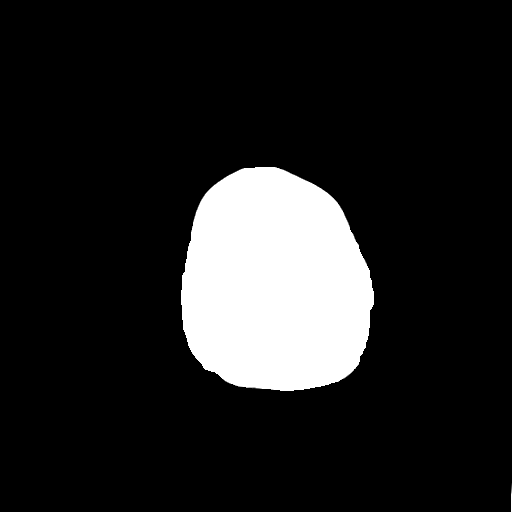

[Series 4: head bone · axial · 0.42mm/px · z∈[-150,-114]mm · 3 of 89 slices shown]
[im 9/89  bone]
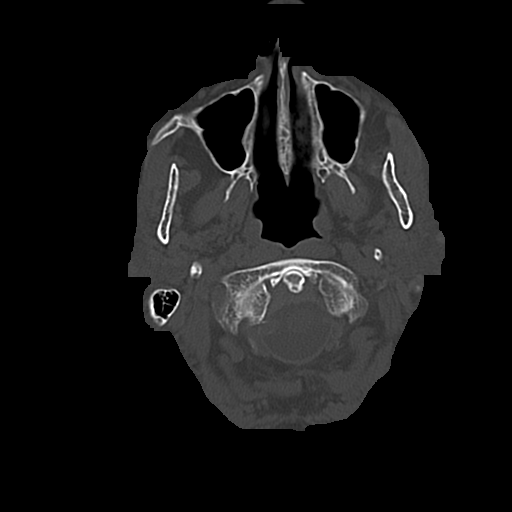
[im 18/89  bone]
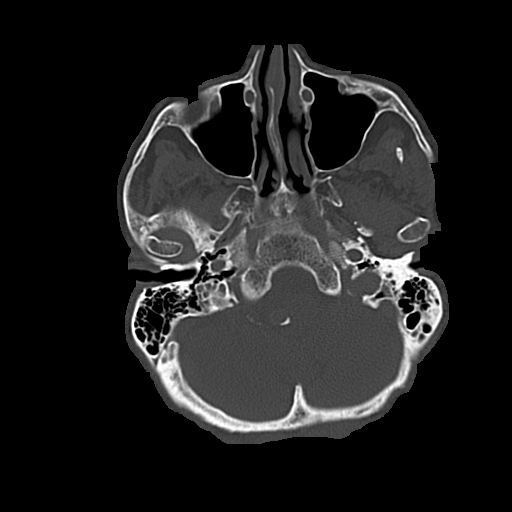
[im 27/89  bone]
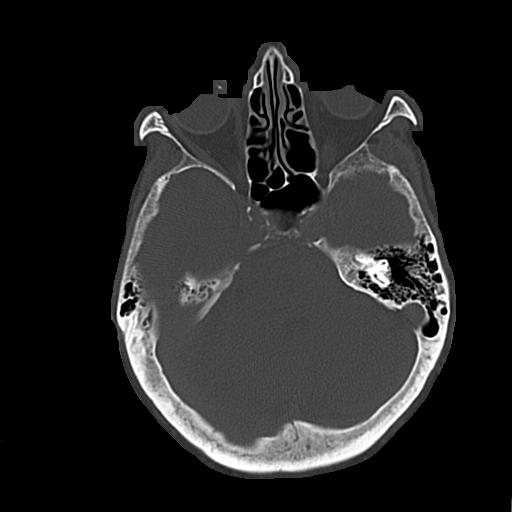

[Series 5: head without cor · coronal · non-contrast · 0.35mm/px · 3 of 67 slices shown]
[im 23/67  brain]
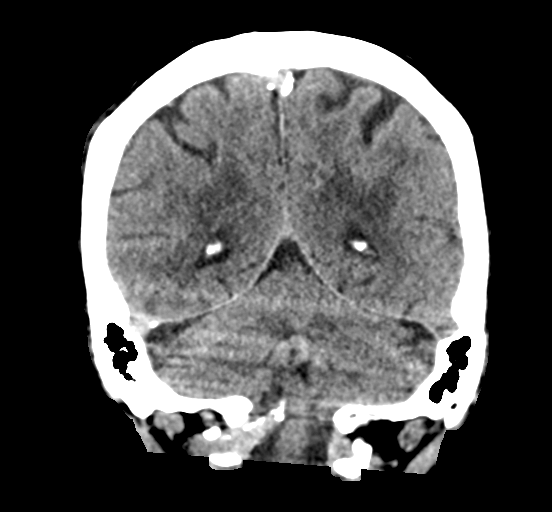
[im 30/67  brain]
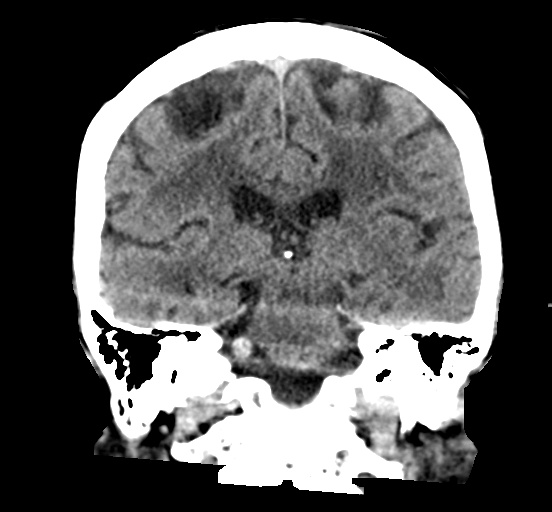
[im 37/67  brain]
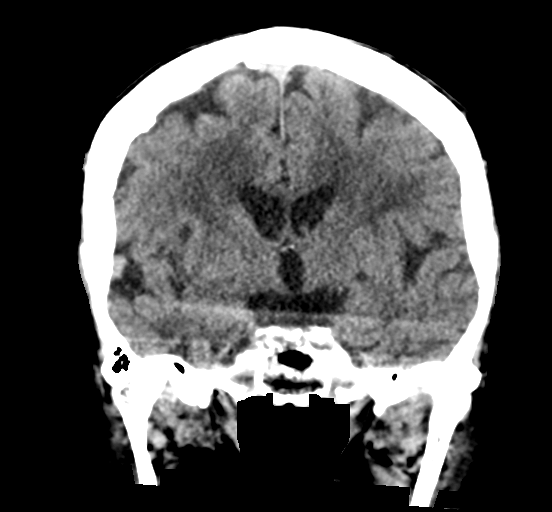

[Series 6: head without sag · sagittal · non-contrast · 0.35mm/px · 3 of 55 slices shown]
[im 19/55  brain]
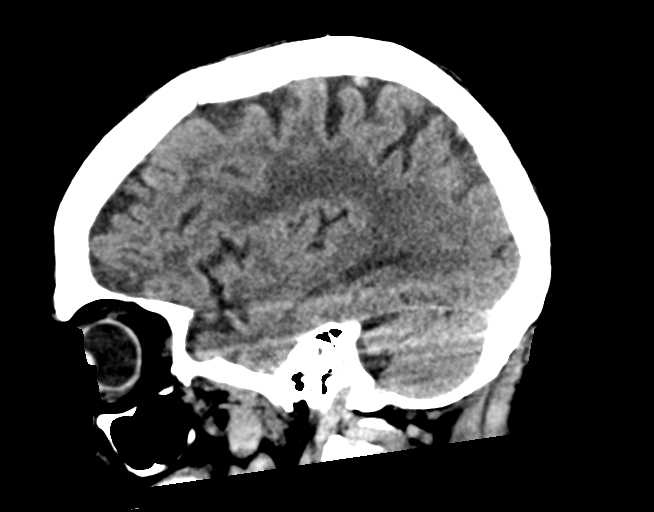
[im 28/55  brain]
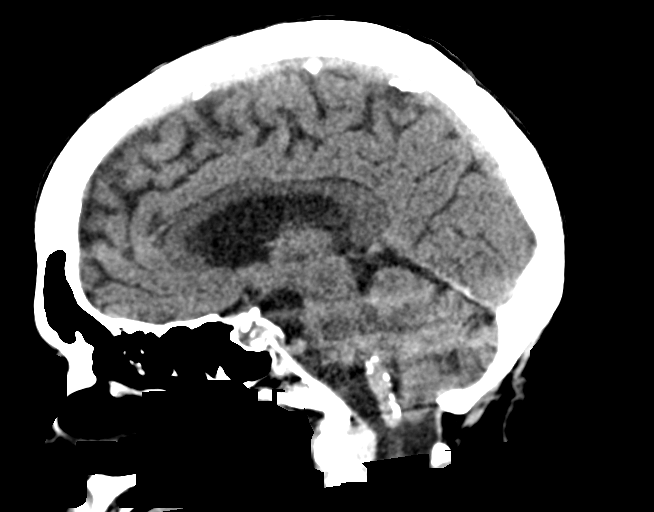
[im 37/55  brain]
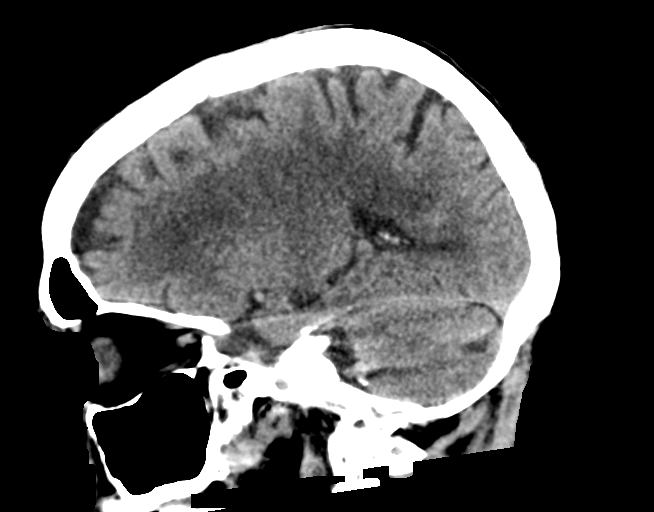

[16 of 47 positions shown; findings below may reference images not displayed]

FINDINGS: Brain: Normal anatomic configuration. Parenchymal volume loss is
commensurate with the patient's age. Moderate to severe subcortical
and periventricular white matter changes are present likely
reflecting the sequela of small vessel ischemia. Remote infarcts are
seen within the right thalamus, right frontal cortex and the left
cerebral hemisphere. Age-indeterminate infarct is seen within the
left thalamus, new since prior examination. No abnormal intra or
extra-axial mass lesion or fluid collection. No abnormal mass effect
or midline shift. No evidence of acute intracranial hemorrhage.
Ventricular size is normal. Cerebellum unremarkable.

Vascular: No asymmetric hyperdense vasculature at the skull base.

Skull: Intact

Sinuses/Orbits: Paranasal sinuses are clear. Orbits are
unremarkable.

Other: Mastoid air cells and middle ear cavities are clear.
IMPRESSION: No acute intracranial injury.  No calvarial fracture.

Age-indeterminate left thalamic infarct. Correlation with clinical
examination is recommended. If indicated, this could be better
assessed with MRI examination.

Multiple remote infarcts, stable since prior examination.

Advanced senescent change.

## 2024-04-22 ENCOUNTER — Encounter: Payer: Self-pay | Admitting: Hematology & Oncology

## 2024-04-22 IMAGING — CT CT HEAD W/O CM
3 of 4 series · 13 of 47 positions shown, 15 images · non-contrast
Comparison: 10/05/2021

CLINICAL DATA: Altered level of consciousness



[Series 2: head without · axial · non-contrast · 0.43mm/px · z∈[-68,+42]mm · 7 of 30 slices shown, 9 images]
[im 4/30  brain]
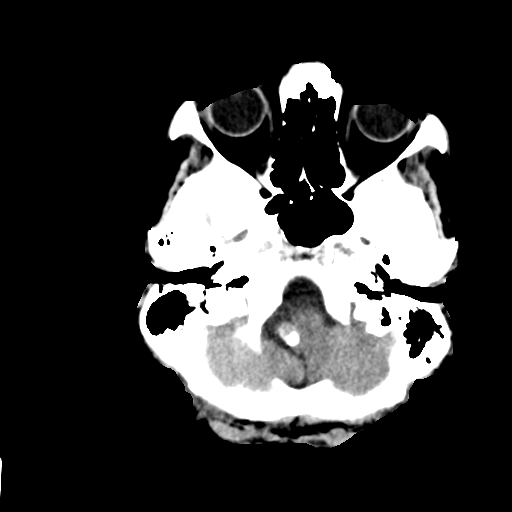
[im 4/30  bone]
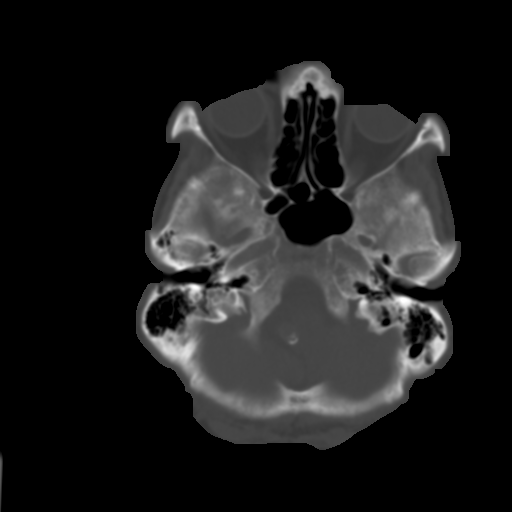
[im 8/30  brain]
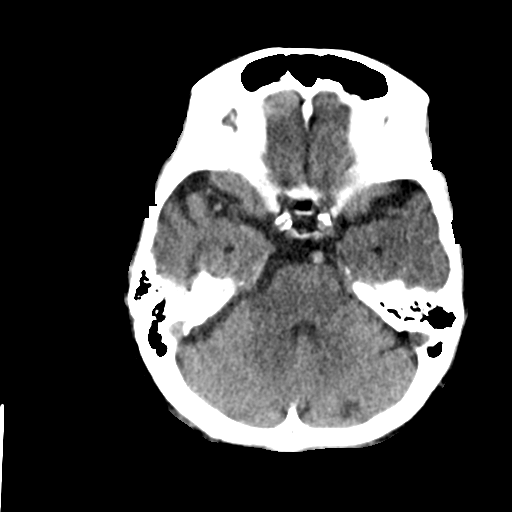
[im 11/30  brain]
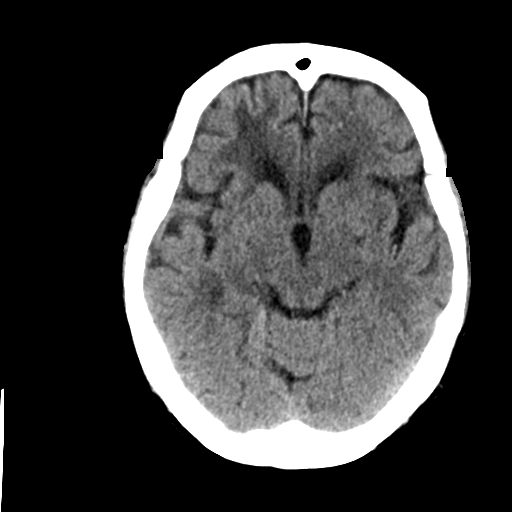
[im 15/30  brain]
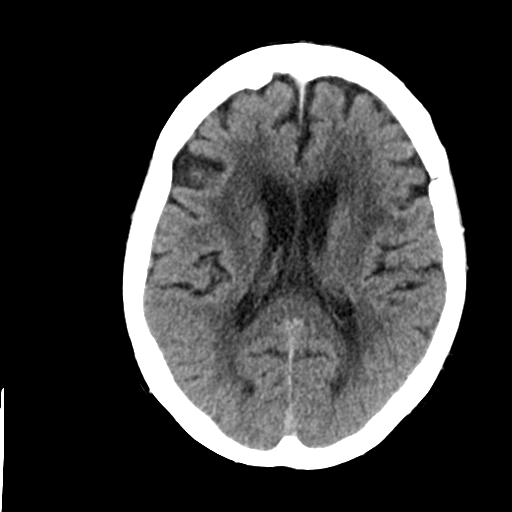
[im 19/30  brain]
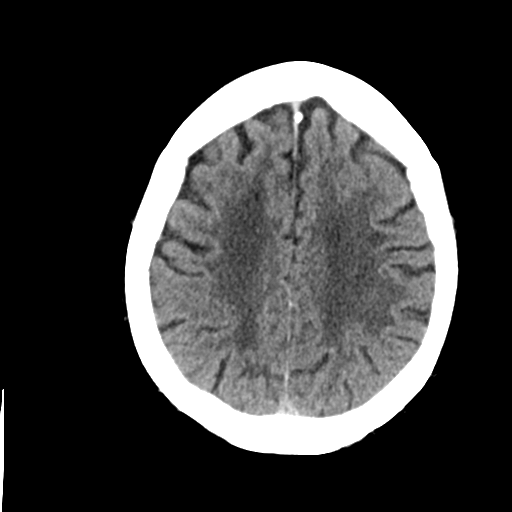
[im 19/30  bone]
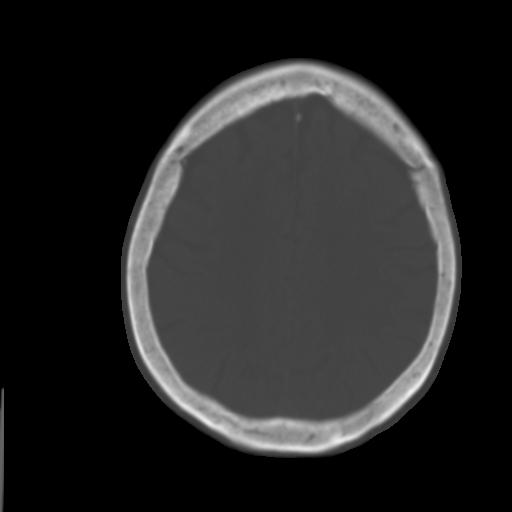
[im 22/30  brain]
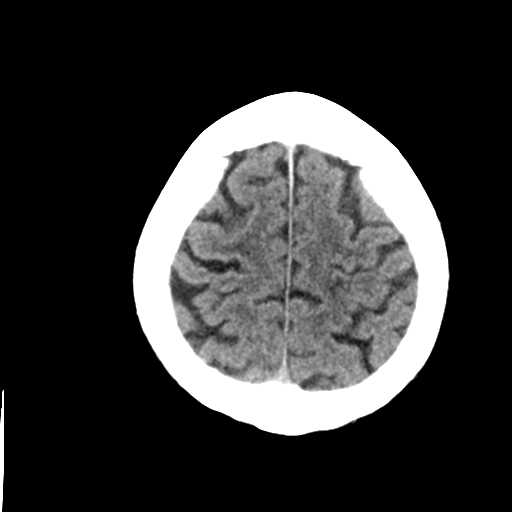
[im 26/30  brain]
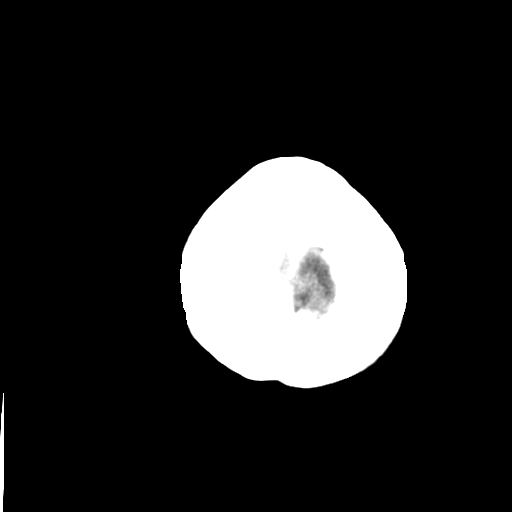

[Series 4: head without cor · coronal · non-contrast · 0.29mm/px · 3 of 67 slices shown]
[im 23/67  brain]
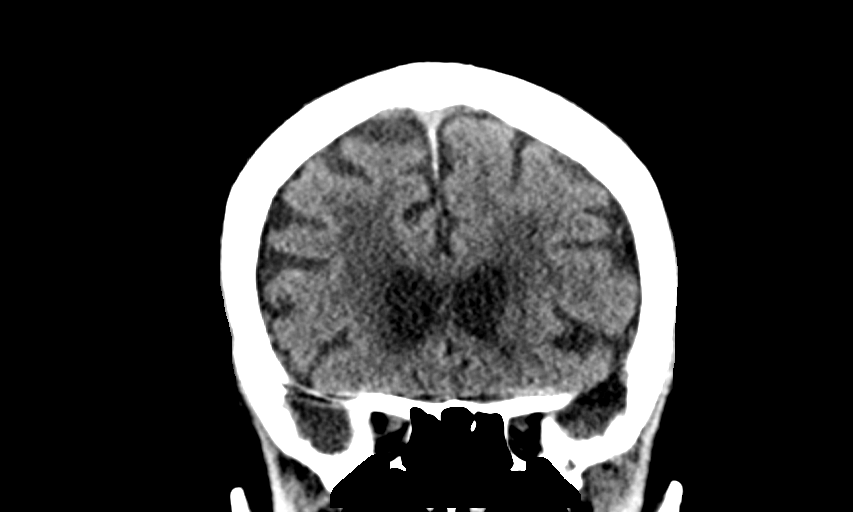
[im 30/67  brain]
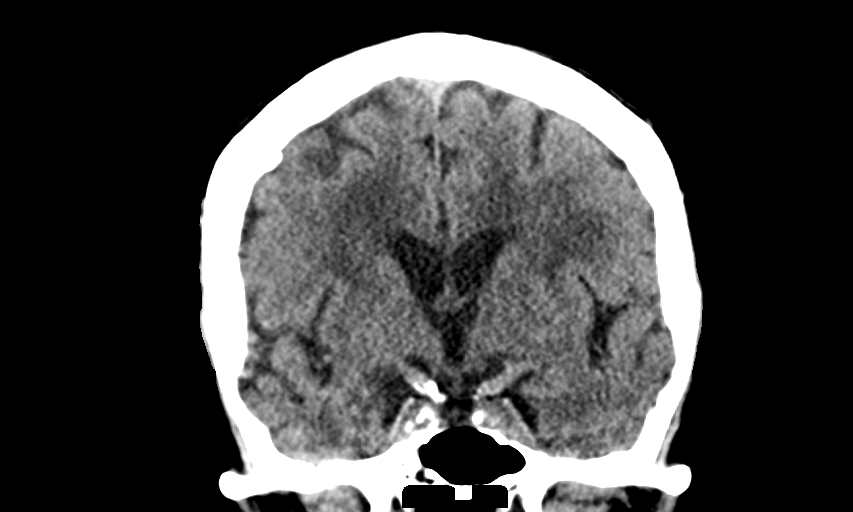
[im 37/67  brain]
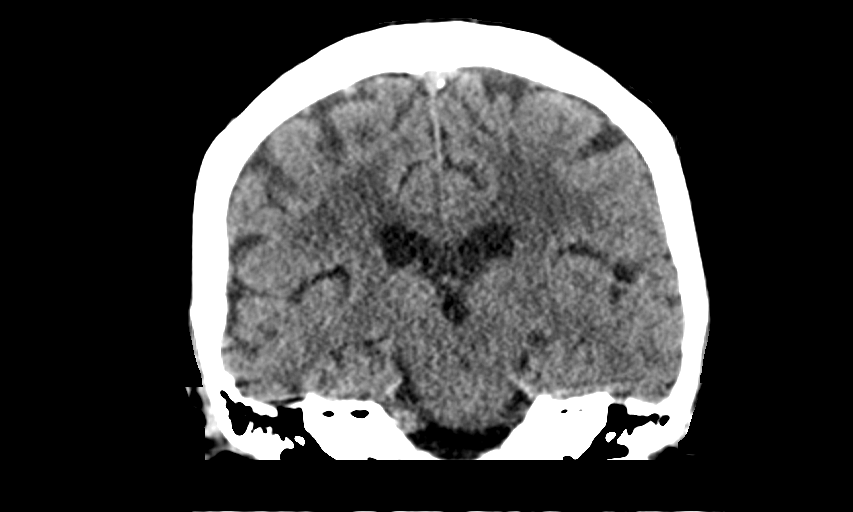

[Series 5: head without sag · sagittal · non-contrast · 0.29mm/px · 3 of 63 slices shown]
[im 21/63  brain]
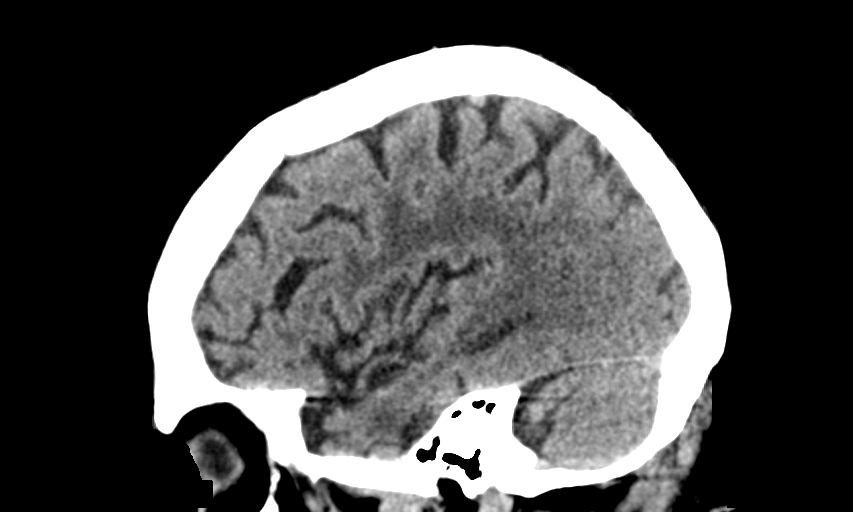
[im 32/63  brain]
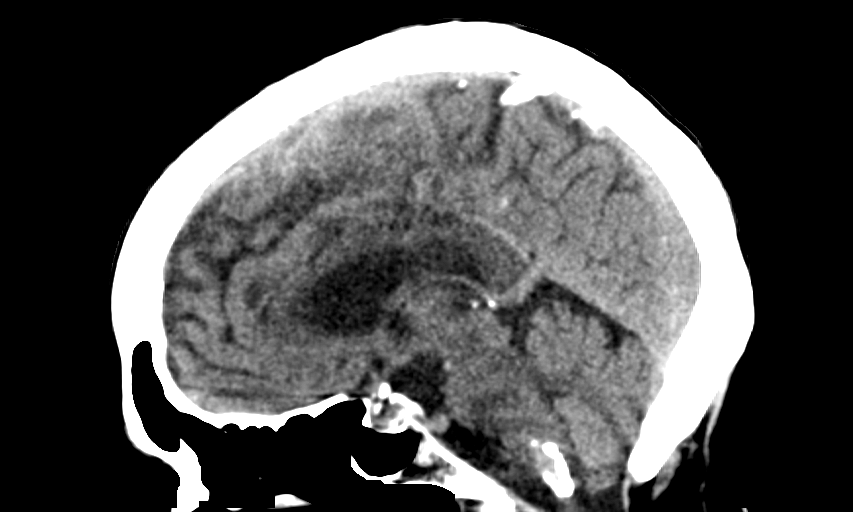
[im 42/63  brain]
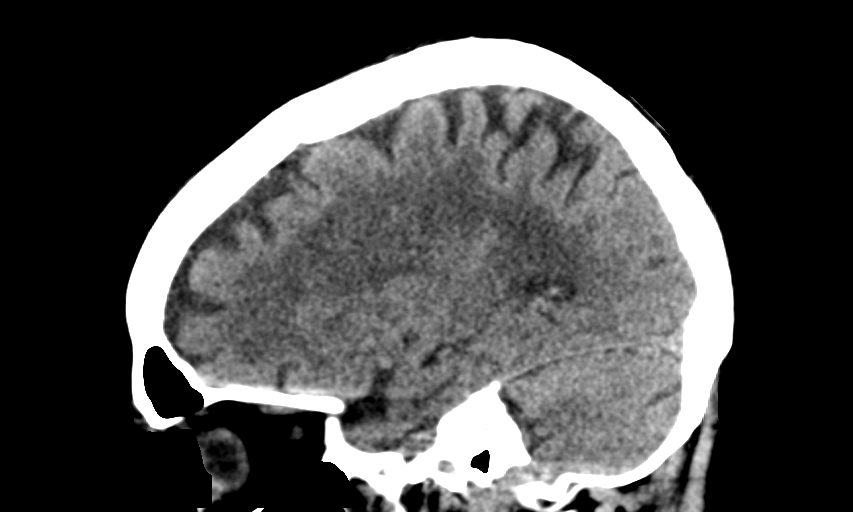

[13 of 47 positions shown; findings below may reference images not displayed]

FINDINGS: Brain: Chronic ischemic changes are again seen throughout the
periventricular white matter, inferior right frontal cortex, and
right thalamus. Hypodensity within the left thalamus compatible with
age indeterminate infarct unchanged since prior exam. No new areas
of infarction or hemorrhage. Lateral ventricles and remaining
midline structures are unremarkable. No acute extra-axial fluid
collections. No mass effect.

Vascular: Stable atherosclerosis.  No hyperdense vessel.

Skull: Normal. Negative for fracture or focal lesion.

Sinuses/Orbits: No acute finding.

Other: None.
IMPRESSION: 1. Stable age-indeterminate left thalamic lacunar infarct.
2. Chronic ischemic changes throughout the periventricular white
matter, inferior right frontal cortex, and right thalamus.
3. No acute hemorrhage.

## 2024-04-22 IMAGING — CR DG CHEST 2V
2 series · 2 of 2 positions shown · non-contrast
Comparison: 10/01/2021.

CLINICAL DATA: Altered mental status.

EXAM:
CHEST - 2 VIEW

[chest lat]
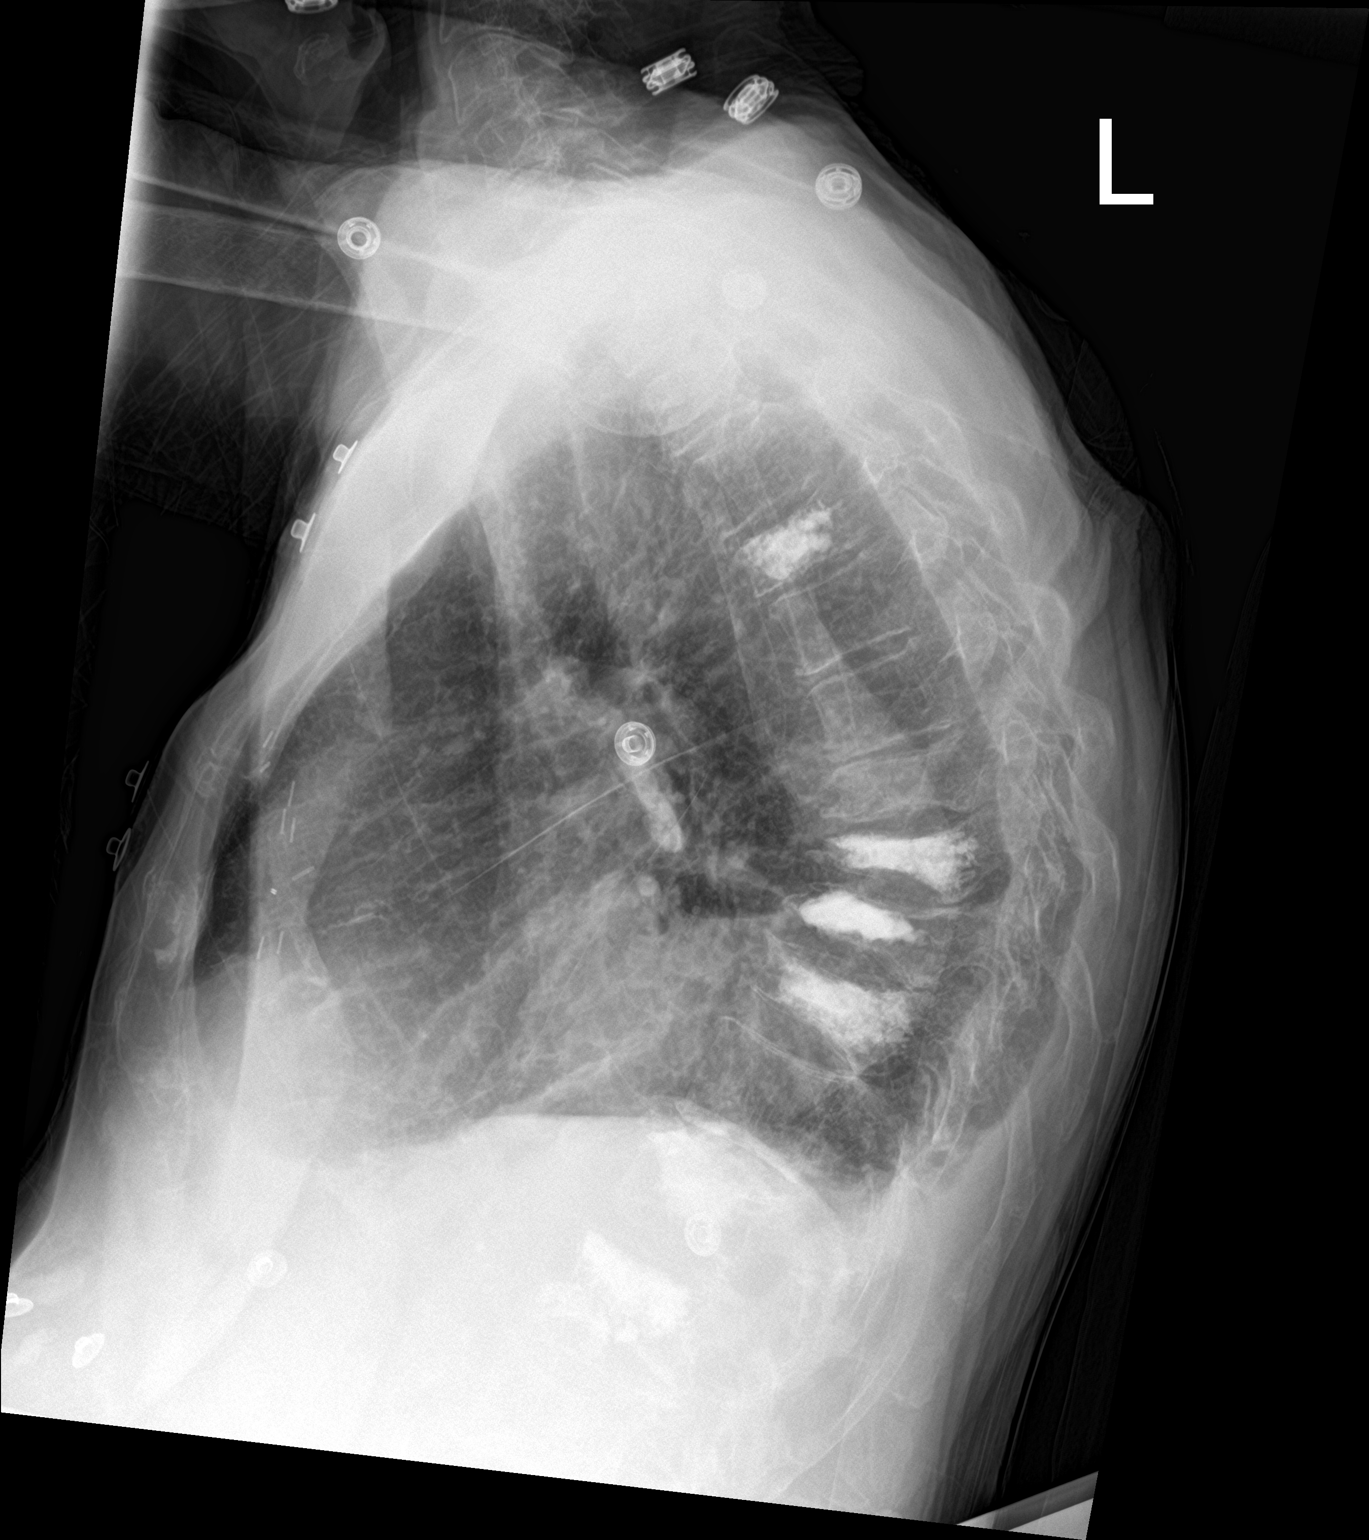

[chest ap]
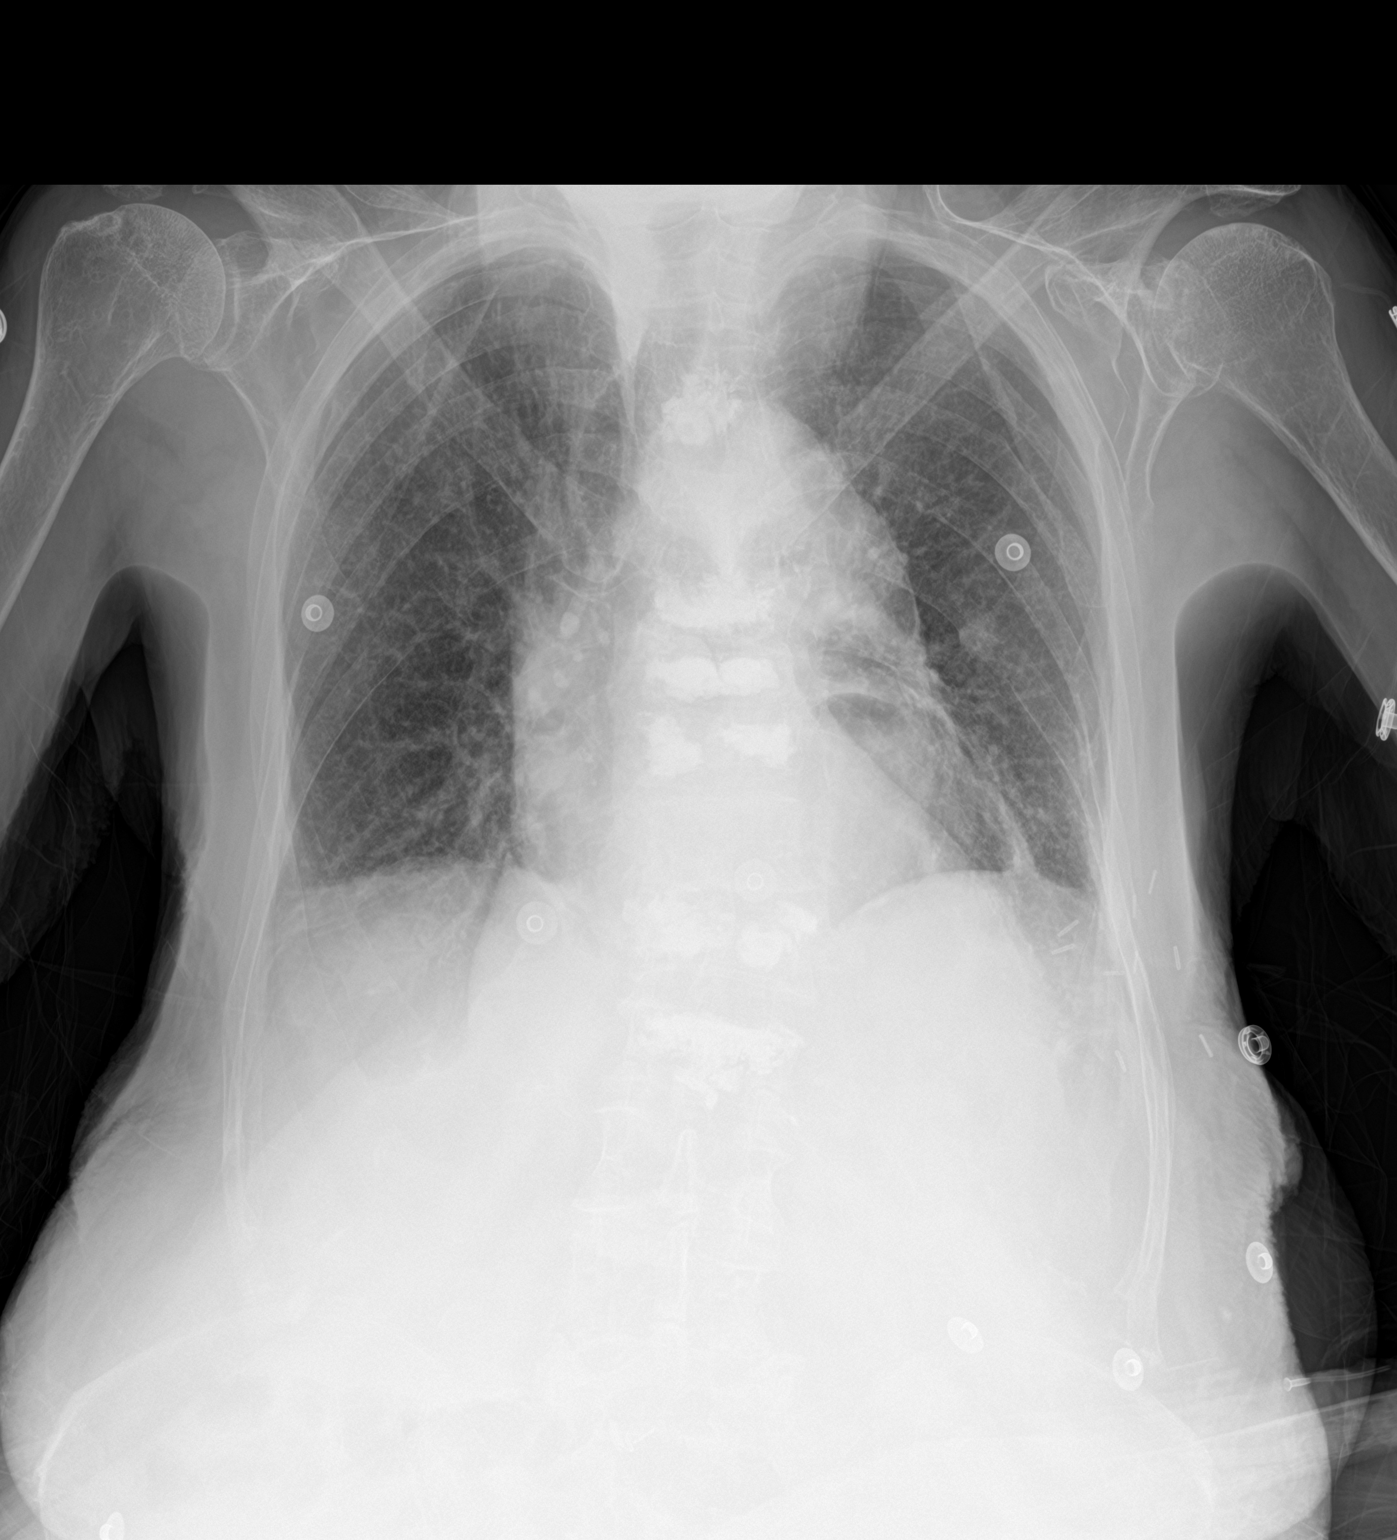

[2 of 2 positions shown; findings below may reference images not displayed]

FINDINGS: Mild enlargement of the cardiopericardial silhouette. Prominent
thoracic aorta. Moderate-sized hiatal hernia. No mediastinal or
hilar masses.

Linear lung base opacities consistent with atelectasis and/or
scarring. Remainder of the lungs is clear.

Surgical vascular clips on the left consistent with prior left
breast surgery, stable.

Multiple vertebral compression fractures treated with
vertebroplasty. Skeletal structures are diffusely demineralized. No
convincing acute skeletal abnormality.
IMPRESSION: No acute cardiopulmonary disease.

## 2024-04-22 IMAGING — MR MR HEAD W/O CM
6 series · 48 of 48 positions shown · non-contrast
Comparison: 07/19/2018 MRI, correlation is also made with CT head
10/07/2021

CLINICAL DATA: Confusion

EXAM:
MRI HEAD WITHOUT CONTRAST
TECHNIQUE: Multiplanar, multiecho pulse sequences of the brain and surrounding
structures were obtained without intravenous contrast.

[Series 5: DWI · axial · 3.0mm · 0.88mm/px · z∈[-110,+29]mm · 16 of 96 slices shown (1 of 4)]
[im 1/96]
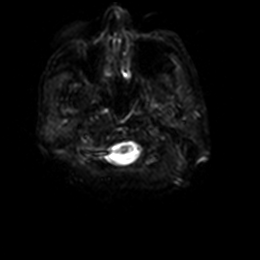
[im 7/96]
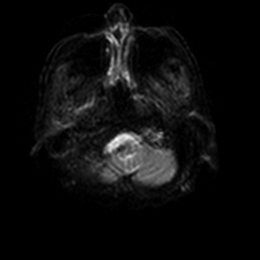
[im 13/96]
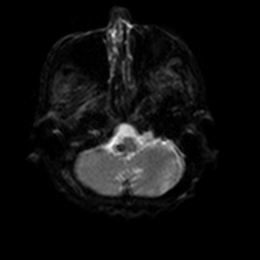
[im 20/96]
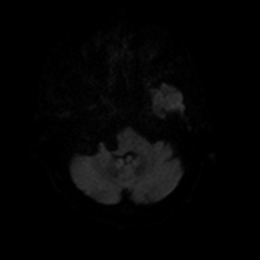
[im 26/96]
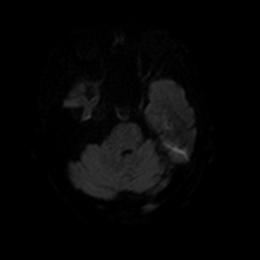
[im 32/96]
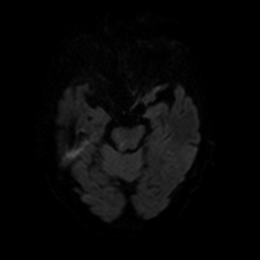
[im 39/96]
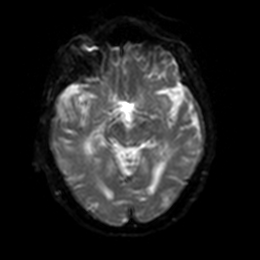
[im 45/96]
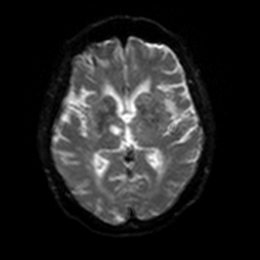
[im 51/96]
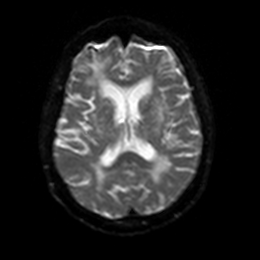
[im 58/96]
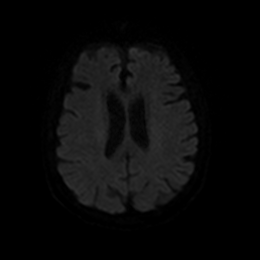
[im 64/96]
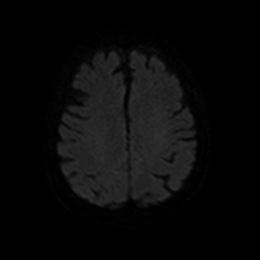
[im 70/96]
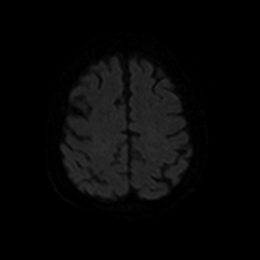
[im 77/96]
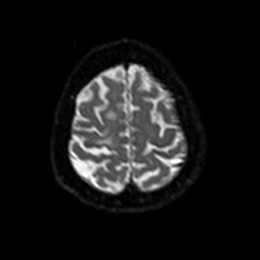
[im 83/96]
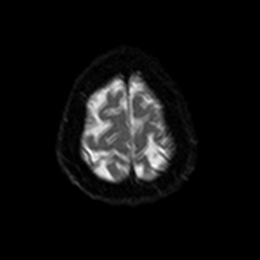
[im 89/96]
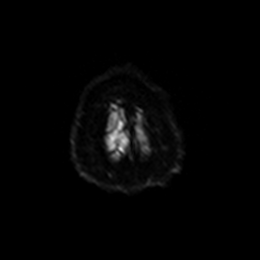
[im 96/96]
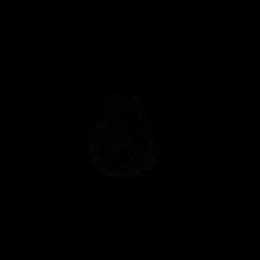

[Series 6: DWI · axial · 3.0mm · 0.88mm/px · z∈[-110,+29]mm · 8 of 48 slices shown (2 of 4)]
[im 1/48]
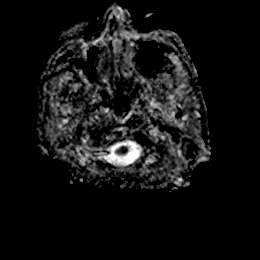
[im 7/48]
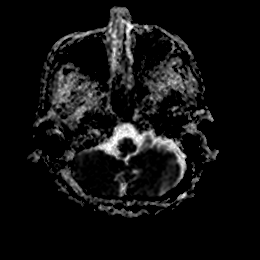
[im 14/48]
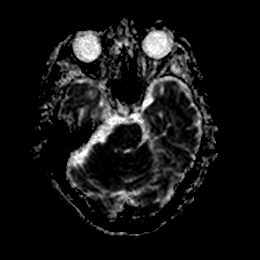
[im 21/48]
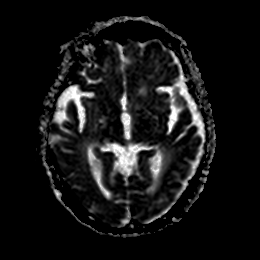
[im 27/48]
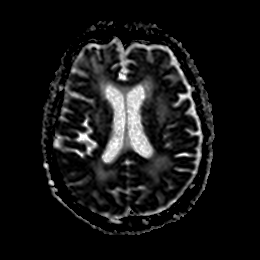
[im 34/48]
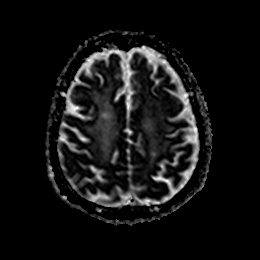
[im 41/48]
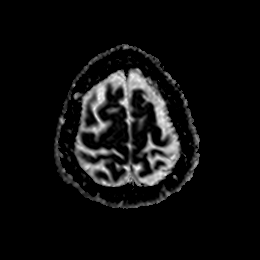
[im 48/48]
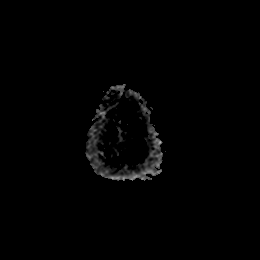

[Series 7: DWI · coronal · 4.0mm · 0.88mm/px · 11 of 64 slices shown (3 of 4)]
[im 1/64]
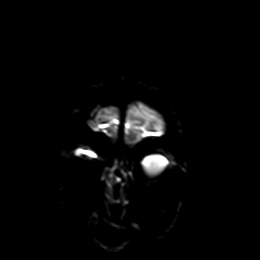
[im 7/64]
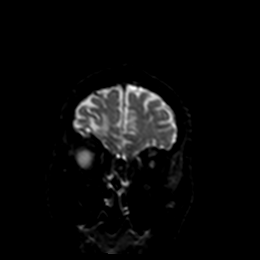
[im 13/64]
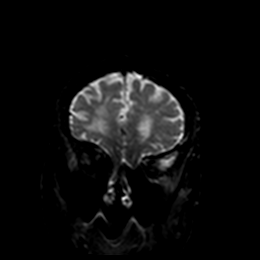
[im 19/64]
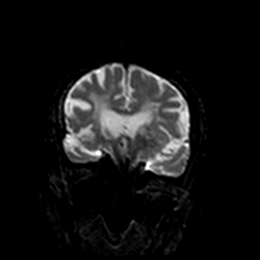
[im 26/64]
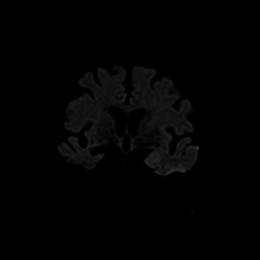
[im 32/64]
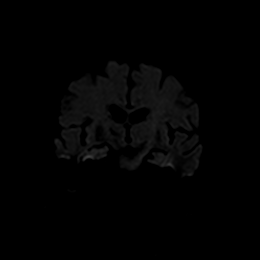
[im 38/64]
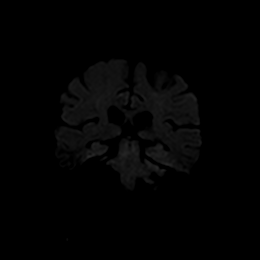
[im 45/64]
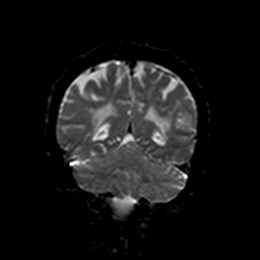
[im 51/64]
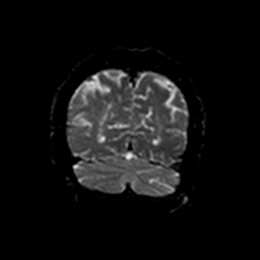
[im 57/64]
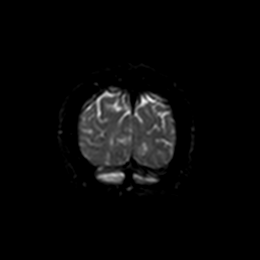
[im 64/64]
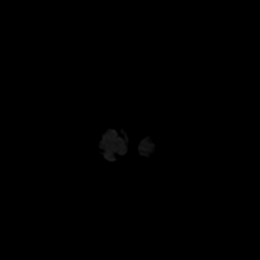

[Series 8: DWI · coronal · 4.0mm · 0.88mm/px · 5 of 32 slices shown (4 of 4)]
[im 1/32]
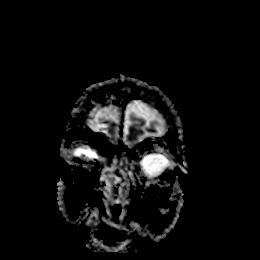
[im 8/32]
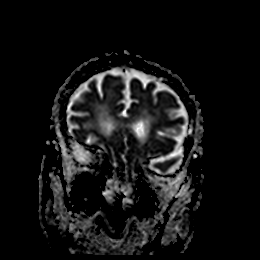
[im 16/32]
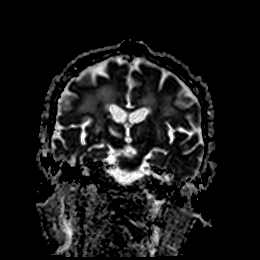
[im 24/32]
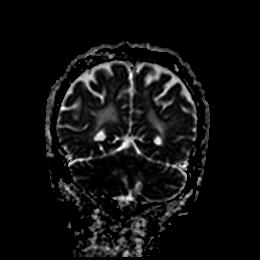
[im 32/32]
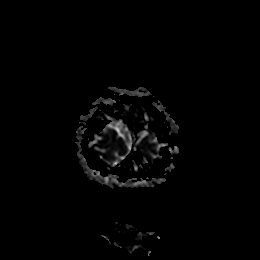

[Series 9: T1 · sagittal · 5.0mm · 0.75mm/px · 4 of 23 slices shown]
[im 1/23]
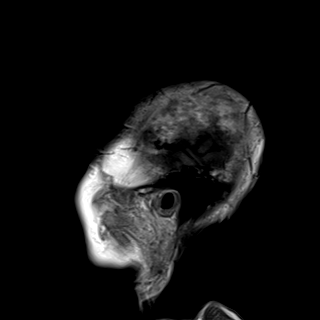
[im 8/23]
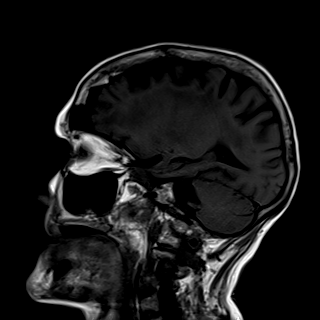
[im 15/23]
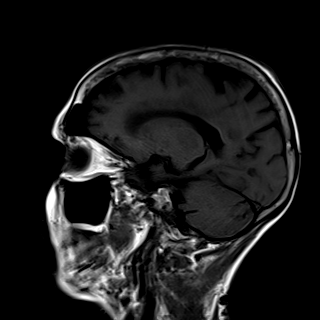
[im 23/23]
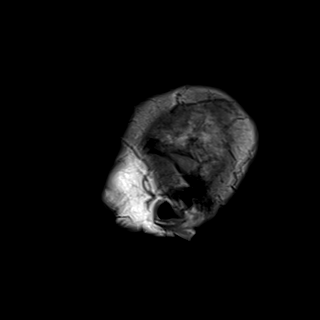

[Series 10: T2 · axial · 5.0mm · 0.72mm/px · z∈[-111,+30]mm · 4 of 25 slices shown]
[im 1/25]
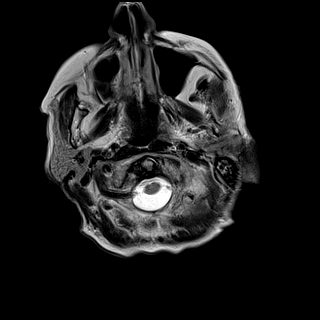
[im 9/25]
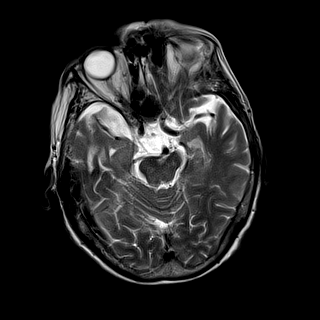
[im 17/25]
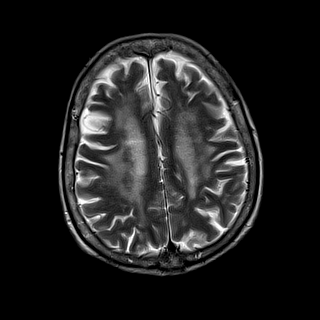
[im 25/25]
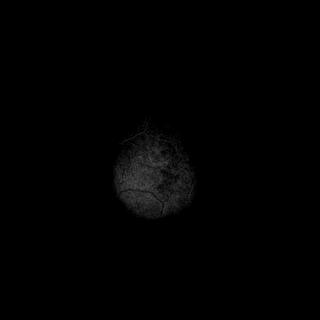

[48 of 48 positions shown; findings below may reference images not displayed]

FINDINGS: Evaluation is limited as the patient was confused and had difficulty
remaining still, as well as trying to remove the coil, causing early
termination of the exam. Only diffusion-weighted sequences, sagittal
T1, and axial T2 were obtained.

Brain: No restricted diffusion to suggest acute or subacute infarct.
No acute hemorrhage, mass, mass effect, or midline shift.

Confluent T2 hyperintense signal in the periventricular white
matter, likely the sequela of severe chronic small vessel ischemic
disease. Redemonstrated moderate size remote right thalamic infarct.
Lacunar infarcts in the left cerebellum, bilateral corona radiata,
and pons are new from the prior exam. The right vertebral artery is
quite tortuous, indenting and causing leftward displacement of the
medulla (series 10, image 4), without definite parenchymal signal
abnormality, similar to the prior exam.

Vascular: Patent arterial flow voids.

Skull and upper cervical spine: Normal marrow signal.

Sinuses/Orbits: No acute finding.

Other: Fluid in left mastoid air cells.
IMPRESSION: Limited evaluation as the study had to be terminated early. Within
this limitation, no acute intracranial process. No acute or subacute
infarct.

## 2024-04-23 ENCOUNTER — Encounter: Payer: Self-pay | Admitting: Hematology & Oncology

## 2024-04-30 ENCOUNTER — Ambulatory Visit

## 2024-04-30 ENCOUNTER — Ambulatory Visit (HOSPITAL_COMMUNITY)
